# Patient Record
Sex: Female | Born: 1970 | Race: White | Hispanic: No | Marital: Single | Smoking: Former smoker
Health system: Southern US, Community
[De-identification: ages and names within clinical notes are randomized; demographics above are authoritative.]

## PROBLEM LIST (undated history)

## (undated) ENCOUNTER — Emergency Department (HOSPITAL_BASED_OUTPATIENT_CLINIC_OR_DEPARTMENT_OTHER)
Disposition: A | Discharge status: Home / Self Care | Payer: Medicaid Other | Attending: Emergency Medicine | Admitting: Emergency Medicine

## (undated) ENCOUNTER — Emergency Department (HOSPITAL_COMMUNITY): Admission: EM | Payer: Medicare Other | Source: Home / Self Care

## (undated) DIAGNOSIS — E7800 Pure hypercholesterolemia, unspecified: Secondary | ICD-10-CM

## (undated) DIAGNOSIS — I10 Essential (primary) hypertension: Secondary | ICD-10-CM

## (undated) DIAGNOSIS — R011 Cardiac murmur, unspecified: Secondary | ICD-10-CM

## (undated) DIAGNOSIS — N183 Chronic kidney disease, stage 3 unspecified: Secondary | ICD-10-CM

## (undated) DIAGNOSIS — J40 Bronchitis, not specified as acute or chronic: Secondary | ICD-10-CM

## (undated) DIAGNOSIS — M1990 Unspecified osteoarthritis, unspecified site: Secondary | ICD-10-CM

## (undated) DIAGNOSIS — H269 Unspecified cataract: Secondary | ICD-10-CM

## (undated) DIAGNOSIS — R7881 Bacteremia: Secondary | ICD-10-CM

## (undated) DIAGNOSIS — L089 Local infection of the skin and subcutaneous tissue, unspecified: Secondary | ICD-10-CM

## (undated) DIAGNOSIS — L97519 Non-pressure chronic ulcer of other part of right foot with unspecified severity: Secondary | ICD-10-CM

## (undated) DIAGNOSIS — Z9289 Personal history of other medical treatment: Secondary | ICD-10-CM

## (undated) DIAGNOSIS — I82409 Acute embolism and thrombosis of unspecified deep veins of unspecified lower extremity: Secondary | ICD-10-CM

## (undated) DIAGNOSIS — L03115 Cellulitis of right lower limb: Secondary | ICD-10-CM

## (undated) DIAGNOSIS — E109 Type 1 diabetes mellitus without complications: Secondary | ICD-10-CM

## (undated) DIAGNOSIS — A0472 Enterocolitis due to Clostridium difficile, not specified as recurrent: Secondary | ICD-10-CM

## (undated) DIAGNOSIS — G4730 Sleep apnea, unspecified: Secondary | ICD-10-CM

## (undated) DIAGNOSIS — G629 Polyneuropathy, unspecified: Secondary | ICD-10-CM

## (undated) DIAGNOSIS — G709 Myoneural disorder, unspecified: Secondary | ICD-10-CM

## (undated) DIAGNOSIS — Z889 Allergy status to unspecified drugs, medicaments and biological substances status: Secondary | ICD-10-CM

## (undated) DIAGNOSIS — E1140 Type 2 diabetes mellitus with diabetic neuropathy, unspecified: Secondary | ICD-10-CM

## (undated) DIAGNOSIS — I739 Peripheral vascular disease, unspecified: Secondary | ICD-10-CM

## (undated) DIAGNOSIS — D649 Anemia, unspecified: Secondary | ICD-10-CM

## (undated) DIAGNOSIS — E11621 Type 2 diabetes mellitus with foot ulcer: Secondary | ICD-10-CM

## (undated) HISTORY — PX: WISDOM TOOTH EXTRACTION: SHX21

## (undated) HISTORY — PX: TRANSMETATARSAL AMPUTATION: SHX6197

## (undated) HISTORY — DX: Allergy status to unspecified drugs, medicaments and biological substances: Z88.9

## (undated) HISTORY — PX: OTHER SURGICAL HISTORY: SHX169

## (undated) HISTORY — DX: Anemia, unspecified: D64.9

## (undated) HISTORY — PX: PERIPHERALLY INSERTED CENTRAL CATHETER INSERTION: SHX2221

## (undated) HISTORY — PX: EYE SURGERY: SHX253

---

## 2004-05-01 ENCOUNTER — Ambulatory Visit (HOSPITAL_COMMUNITY): Admission: AD | Admit: 2004-05-01 | Discharge: 2004-05-02 | Payer: Self-pay | Admitting: Obstetrics & Gynecology

## 2004-05-02 ENCOUNTER — Encounter (INDEPENDENT_AMBULATORY_CARE_PROVIDER_SITE_OTHER): Payer: Self-pay | Admitting: Specialist

## 2005-01-14 ENCOUNTER — Emergency Department (HOSPITAL_COMMUNITY): Admission: EM | Admit: 2005-01-14 | Discharge: 2005-01-14 | Payer: Self-pay | Admitting: Emergency Medicine

## 2005-05-25 ENCOUNTER — Inpatient Hospital Stay (HOSPITAL_COMMUNITY): Admission: EM | Admit: 2005-05-25 | Discharge: 2005-05-30 | Payer: Self-pay | Admitting: Emergency Medicine

## 2005-06-05 ENCOUNTER — Ambulatory Visit: Payer: Self-pay | Admitting: Nurse Practitioner

## 2005-09-26 IMAGING — US US OB COMP LESS 14 WK
1 series · 14 of 28 positions shown · non-contrast
Comparison: none

CLINICAL DATA: 33-year-old female, with a positive pregnancy test and an estimated gestation of eight weeks by LMP.  Pelvic pain and bleeding. 
 OB ULTRASOUND LESS THAN 14 WEEKS WITH TRANSVAGINAL MODIFY:

[Series 1: us ob comp less 14 wk · 0.29mm/px · 40 acquisitions, 14 frames shown]
[im 2/40]
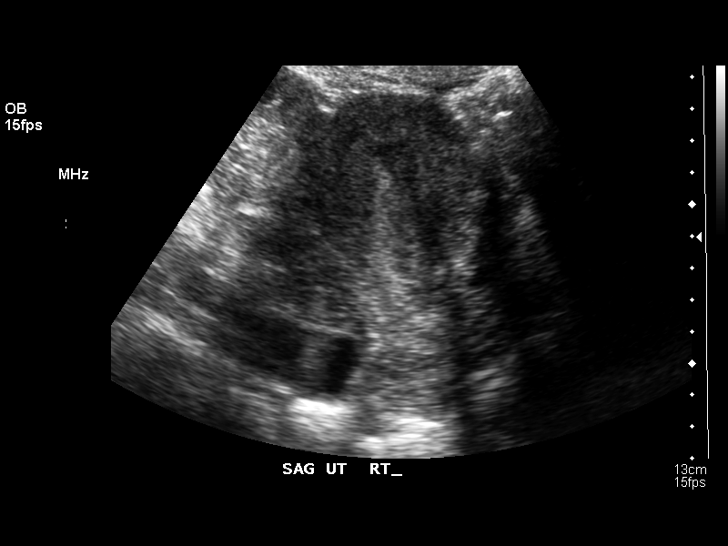
[im 5/40]
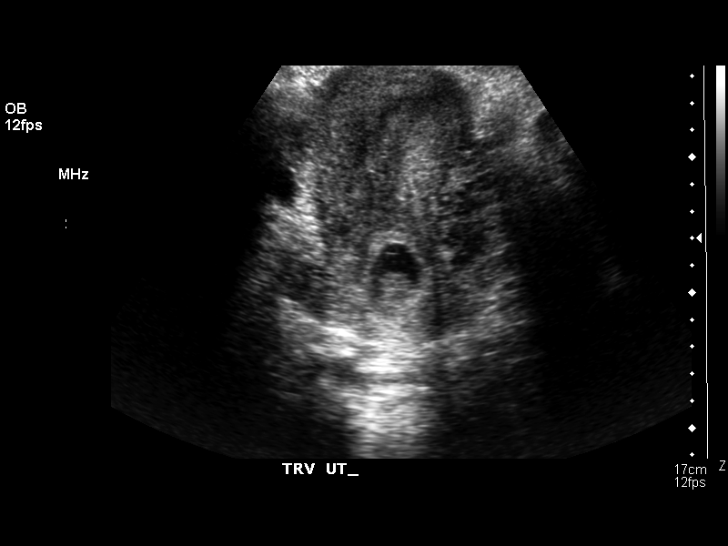
[im 8/40]
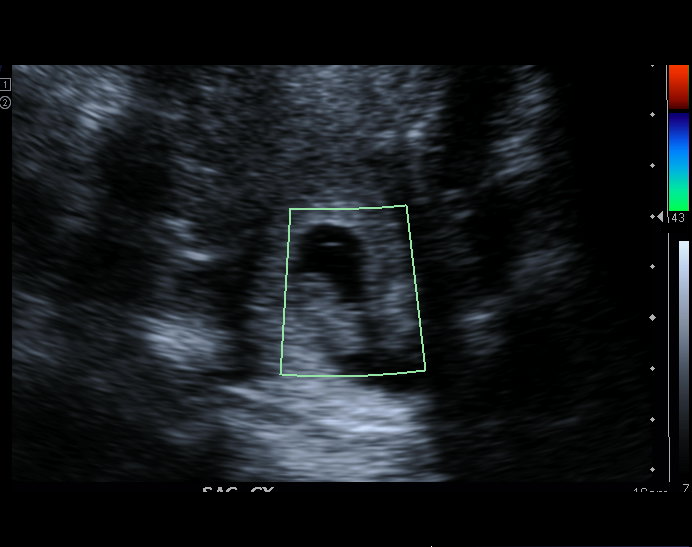
[im 11/40]
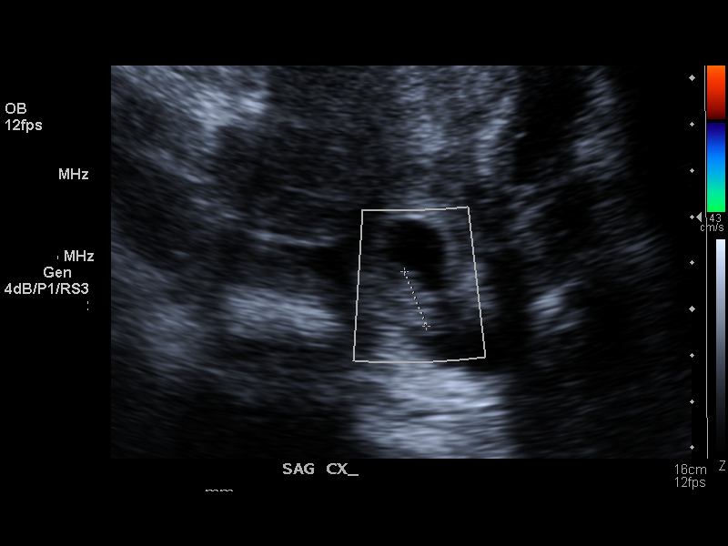
[im 14/40]
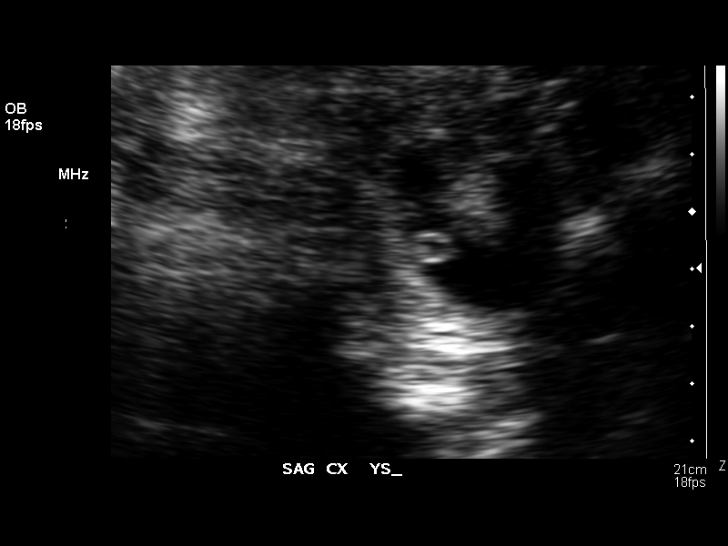
[im 16/40]
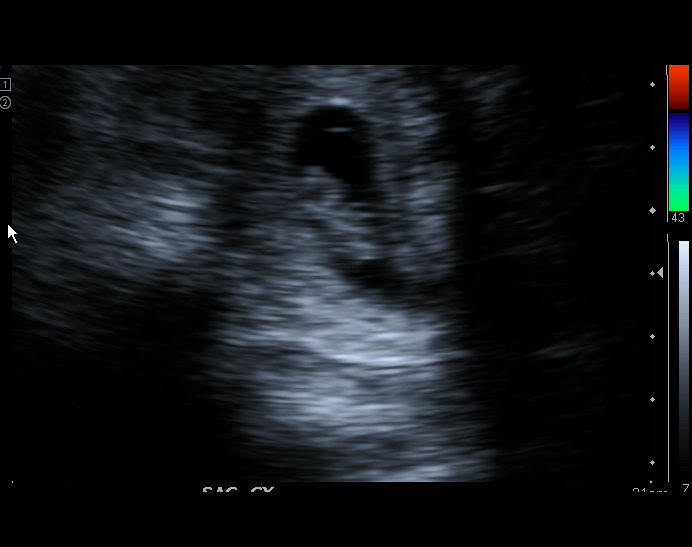
[im 19/40]
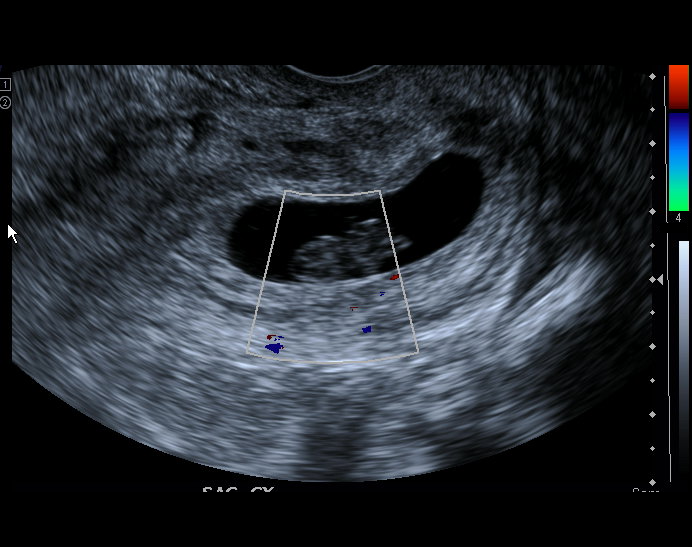
[im 22/40]
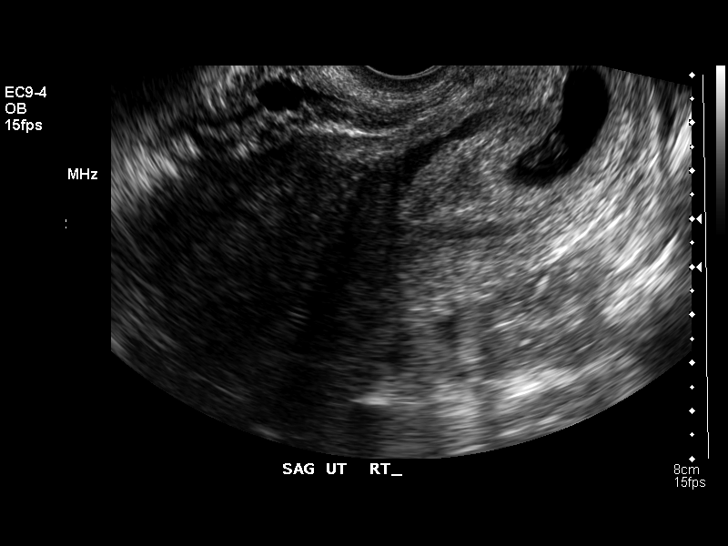
[im 25/40]
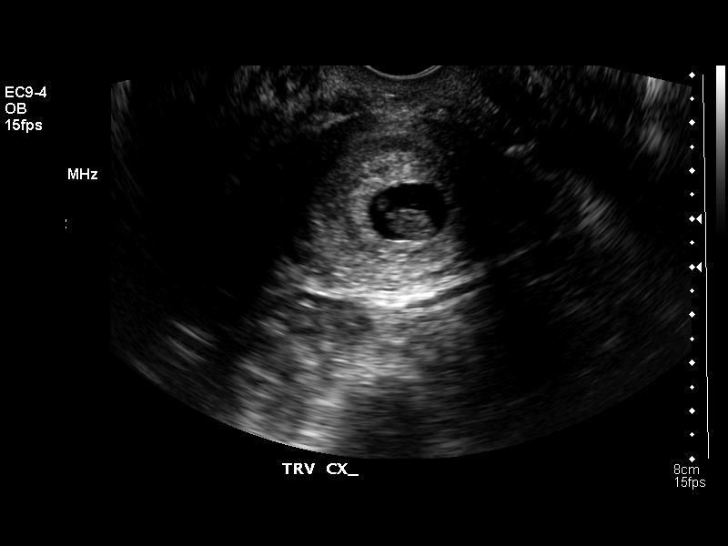
[im 28/40]
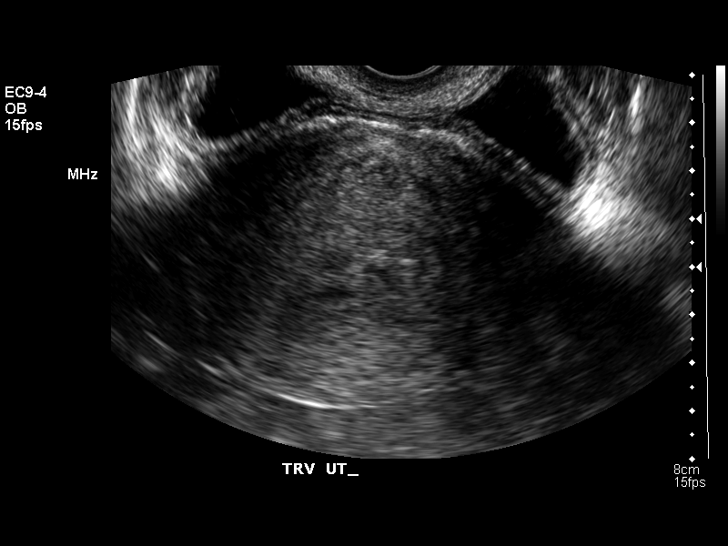
[im 31/40]
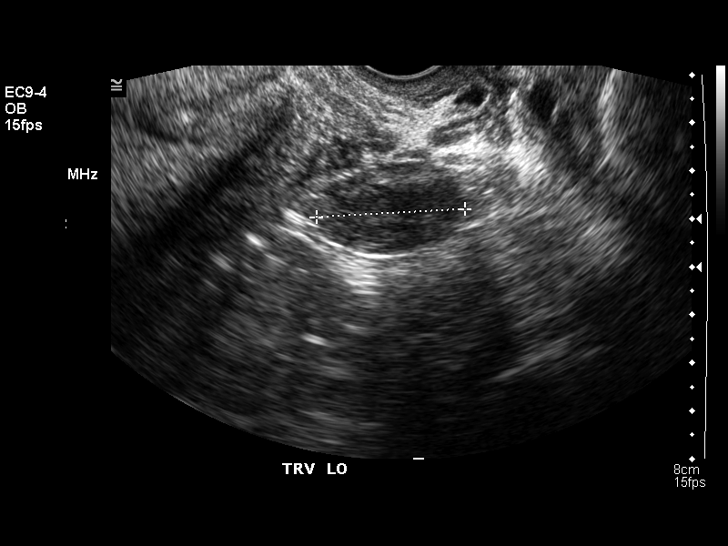
[im 34/40]
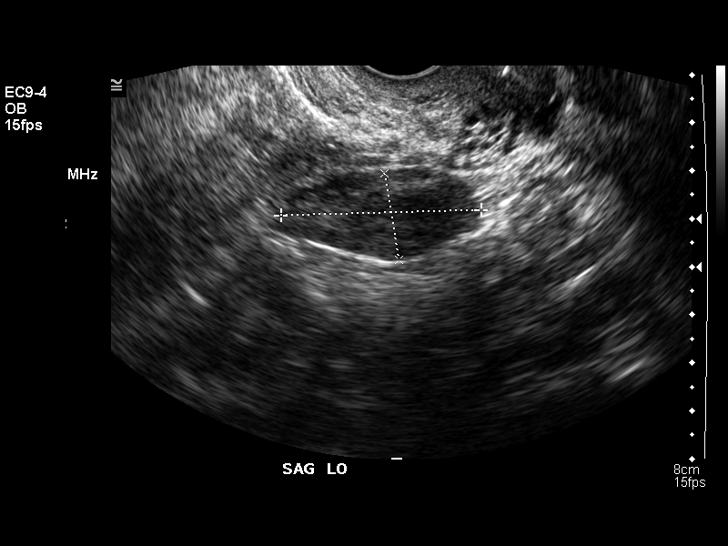
[im 37/40]
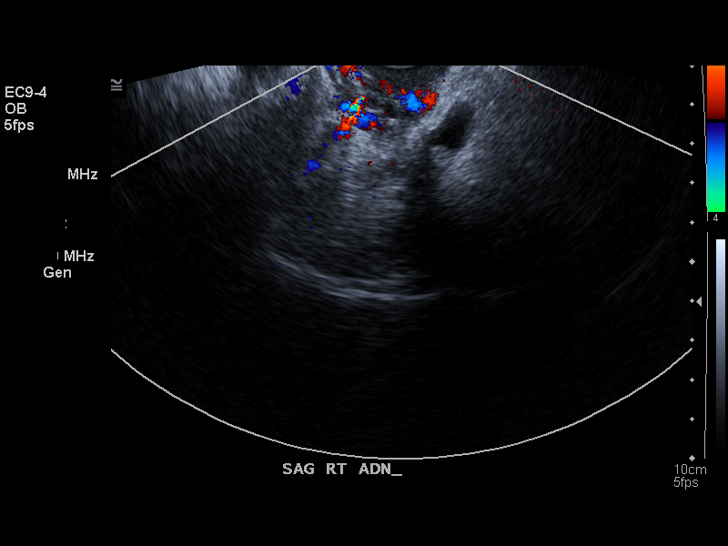
[im 40/40]
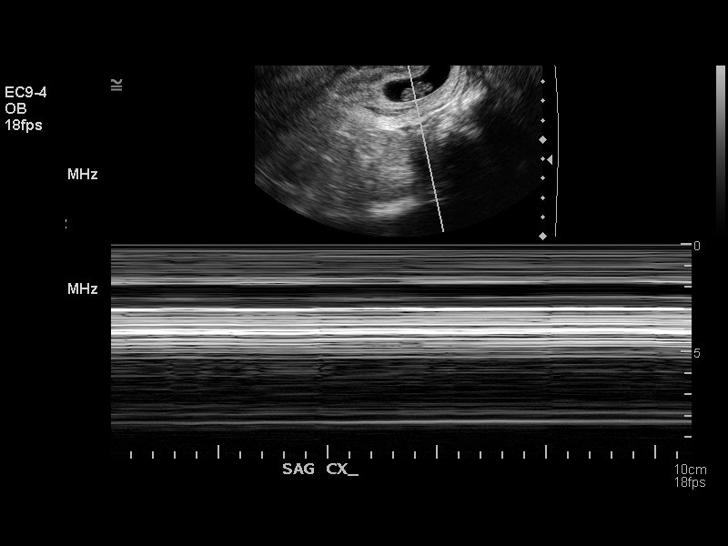

[14 of 28 positions shown; findings below may reference images not displayed]

FINDINGS: There is a gestational sac with yolk sac and embryo within the cervical canal.  No fetal heart tones are noted.  Crown-rump length of this embryo measures 13.7 mm corresponding to a gestational age of 7 weeks, 5 days.  There is a heterogeneous thickened endometrium and subchorionic hemorrhage noted.  Right ovary is not visualized.   Left ovary is unremarkable except for probable resolving corpus luteum measuring 1.5 cm.  No evidence of free fluid or adnexal masses.
IMPRESSION: 1.  Gestation within the cervical canal compatible with spontaneous abortion in progress.  No evidence of cardiac activity in the embryo. 
 2.  Right ovary not visualized. 
 3.  No evidence of free fluid or adnexal masses.

## 2006-05-03 ENCOUNTER — Ambulatory Visit: Payer: Self-pay | Admitting: Internal Medicine

## 2006-05-06 ENCOUNTER — Ambulatory Visit: Payer: Self-pay | Admitting: *Deleted

## 2006-06-11 IMAGING — CR DG RIBS W/ CHEST 3+V*L*
3 series · 3 of 3 positions shown · non-contrast
Comparison: none

CLINICAL DATA: Trauma, rib pain.
 FRONTAL CHEST WITH UNILATERAL LEFT RIBS ? 2 VIEW:
 The heart size and mediastinal contours are within normal limits.  Both lungs are clear.  
 No displaced rib fracture is seen.

[w chest pa]
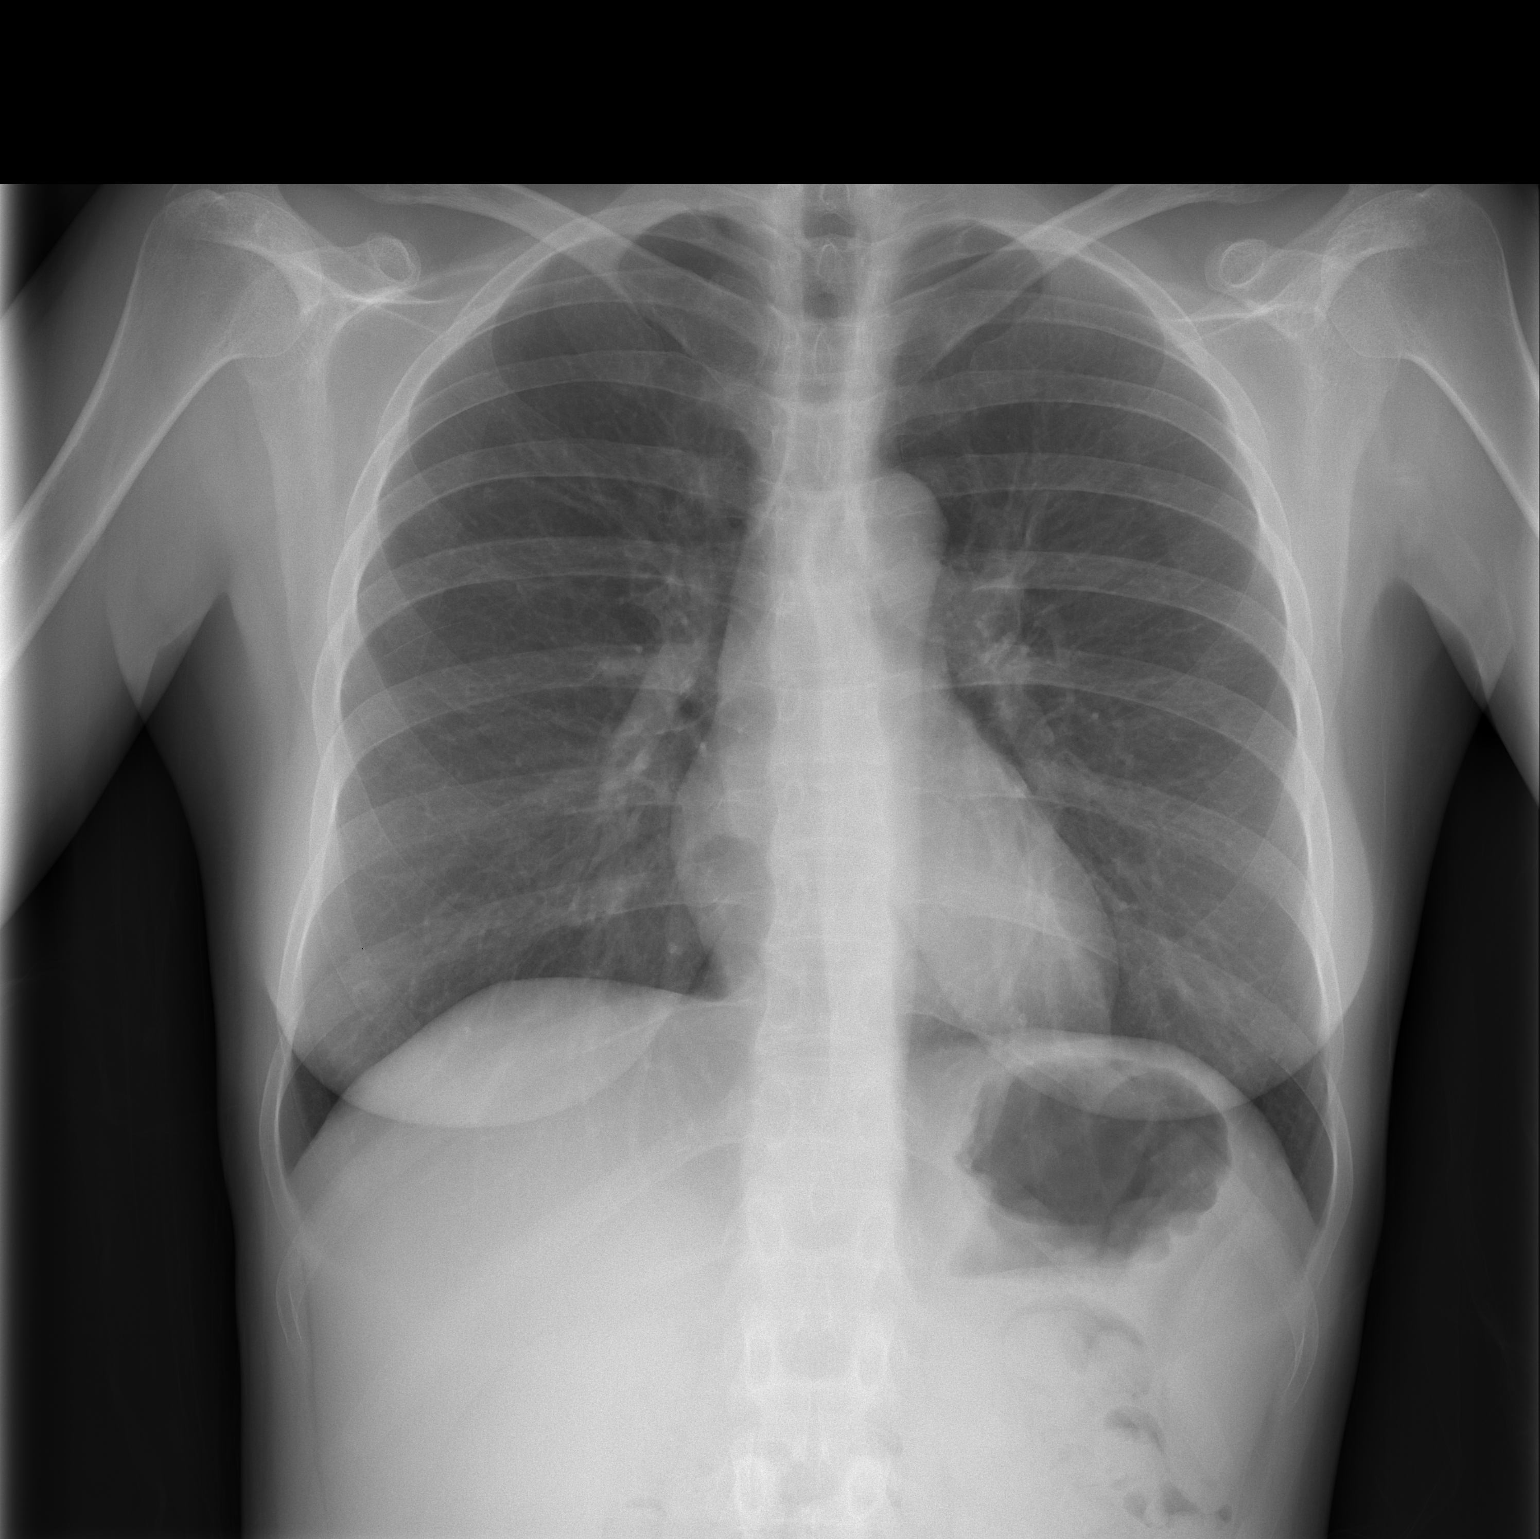

[w ribs ap/pa upper left]
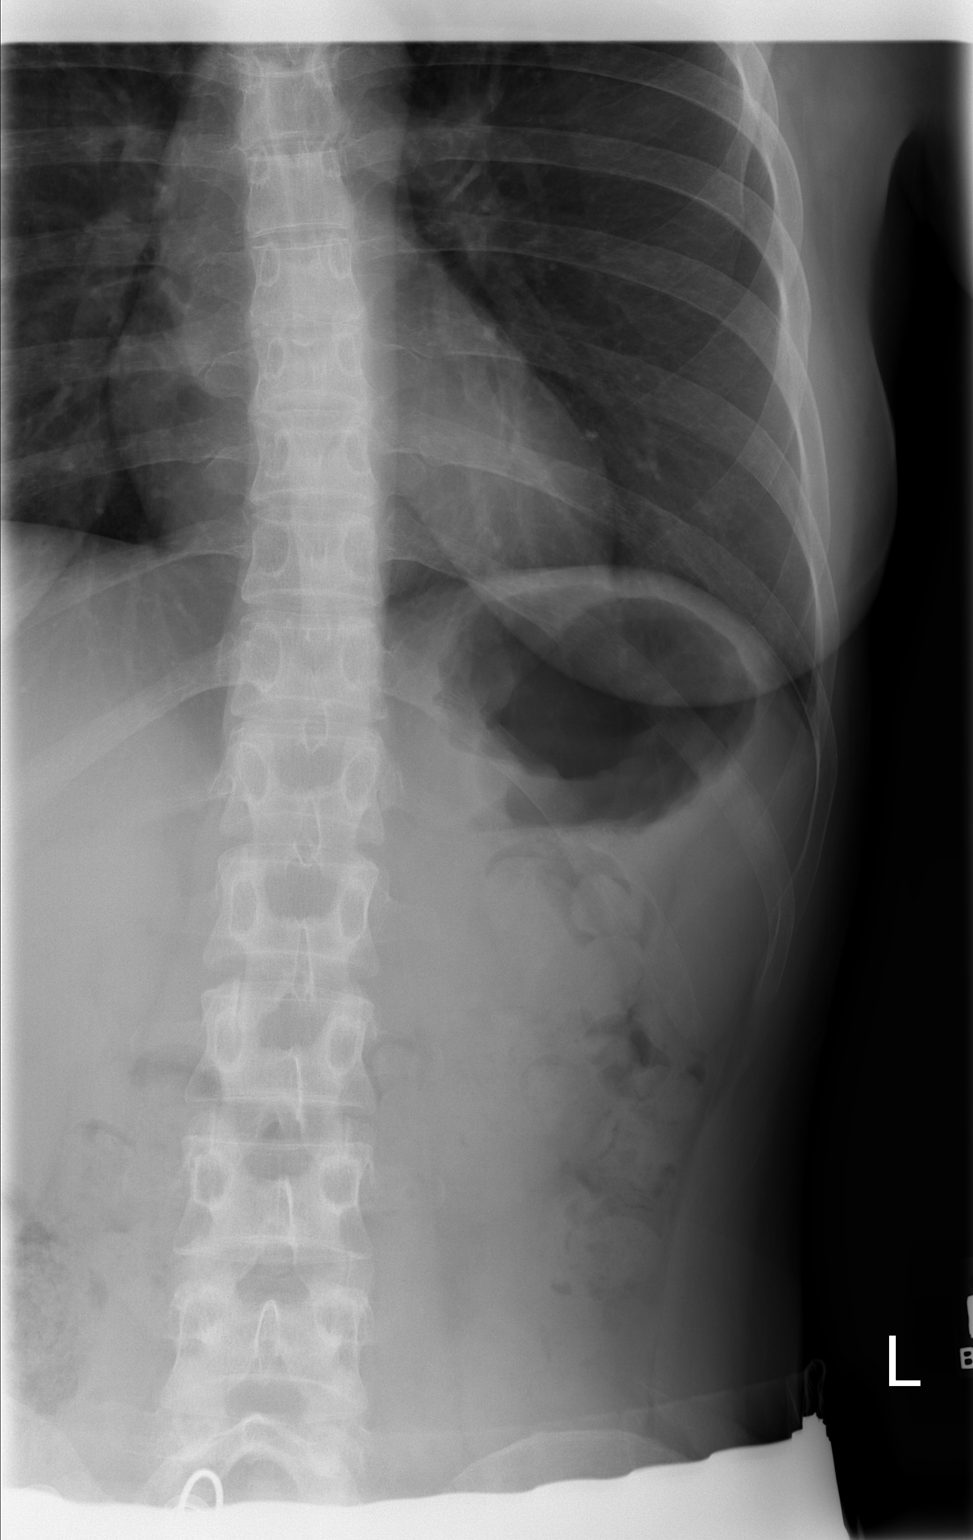

[w ribs oblique left *]
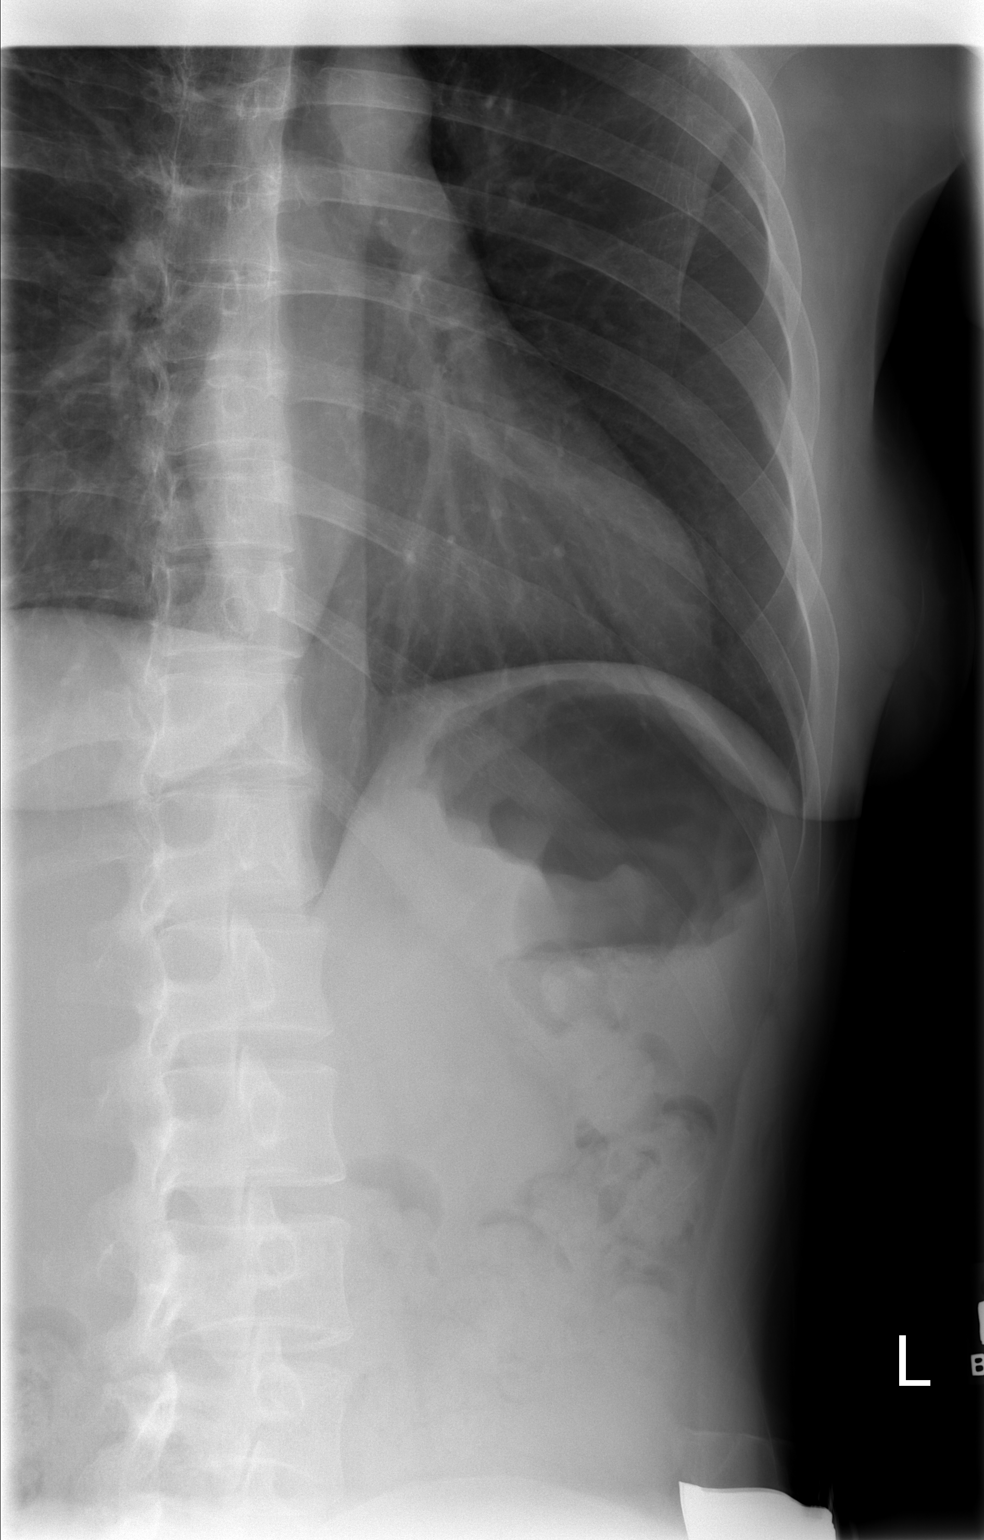

[3 of 3 positions shown; findings below may reference images not displayed]

IMPRESSION: 1.  No active disease.
 2.  No displaced rib fracture is seen.

## 2006-07-05 ENCOUNTER — Encounter (INDEPENDENT_AMBULATORY_CARE_PROVIDER_SITE_OTHER): Payer: Self-pay | Admitting: Specialist

## 2006-07-05 ENCOUNTER — Ambulatory Visit: Payer: Self-pay | Admitting: Internal Medicine

## 2006-08-30 ENCOUNTER — Ambulatory Visit: Payer: Self-pay | Admitting: Family Medicine

## 2006-10-07 ENCOUNTER — Ambulatory Visit: Payer: Self-pay | Admitting: Internal Medicine

## 2006-10-20 IMAGING — CT CT ABDOMEN W/ CM
2 of 5 series · 16 of 46 positions shown, 18 images · IV contrast (omnipaque)
Comparison: none

CLINICAL DATA: 34-year-old with abdominal pain.  Nausea, vomiting. 
ABDOMEN CT WITH CONTRAST:
TECHNIQUE: Multidetector CT imaging of the abdomen was performed following the standard protocol during bolus administration of intravenous contrast.
Contrast:  125 cc Omnipaque 300.  Oral contrast was given.
TECHNIQUE: Multidetector CT imaging of the pelvis was performed following the standard protocol during bolus administration of intravenous contrast.

[Series 2: abd_pel 5.0 b40f st · axial · 0.59mm/px · z∈[+849,+1304]mm · 13 of 103 slices shown, 15 images]
[im 6/103  soft-tissue]
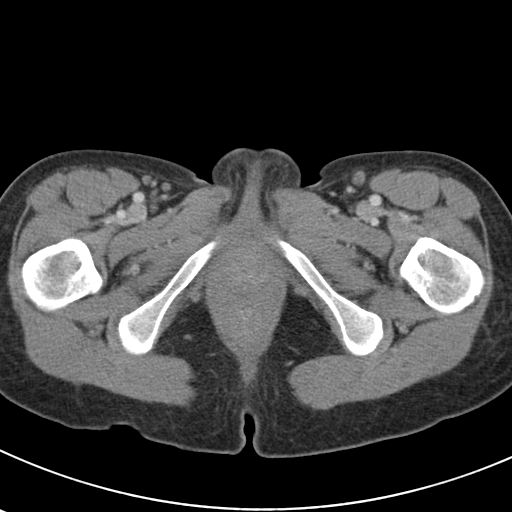
[im 6/103  bone]
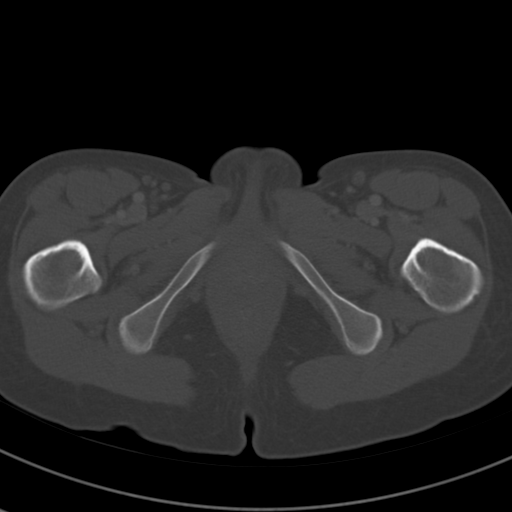
[im 16/103  soft-tissue]
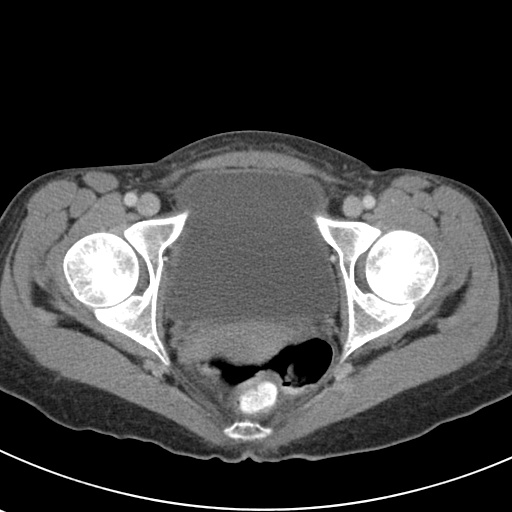
[im 21/103  soft-tissue]
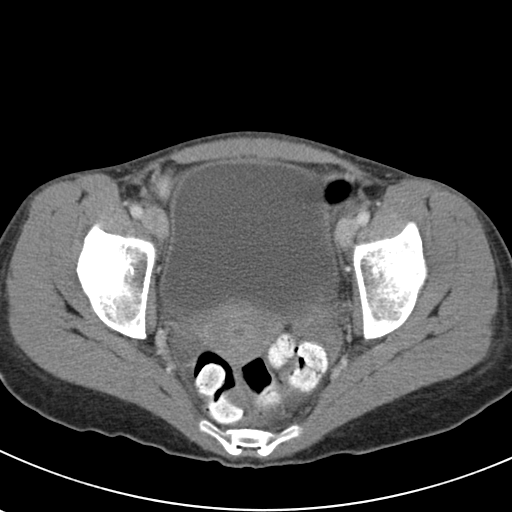
[im 31/103  soft-tissue]
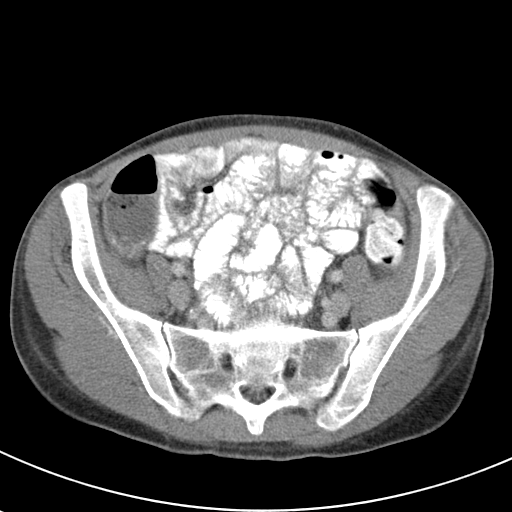
[im 36/103  soft-tissue]
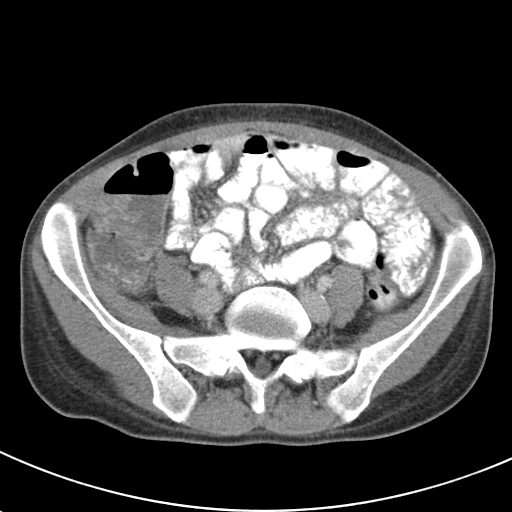
[im 46/103  soft-tissue]
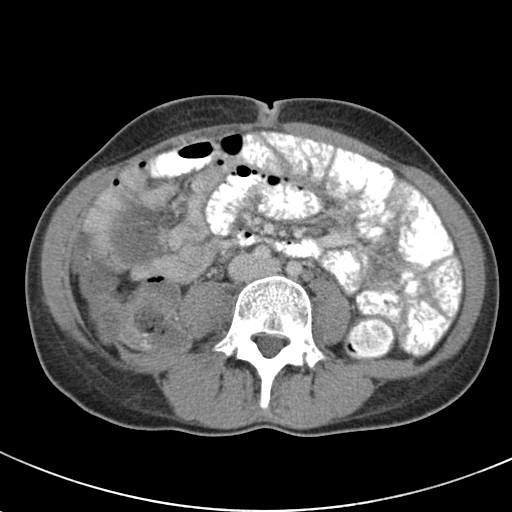
[im 52/103  soft-tissue]
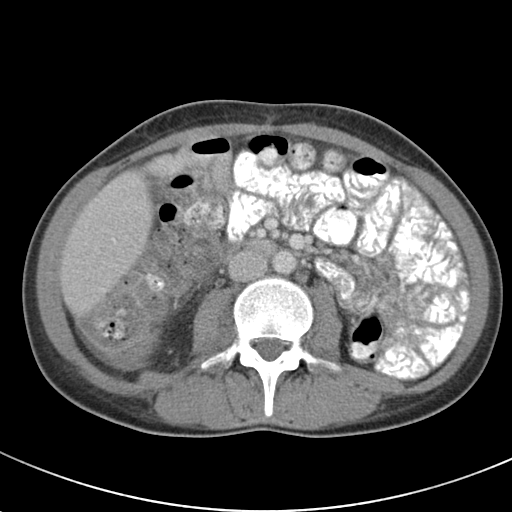
[im 57/103  soft-tissue]
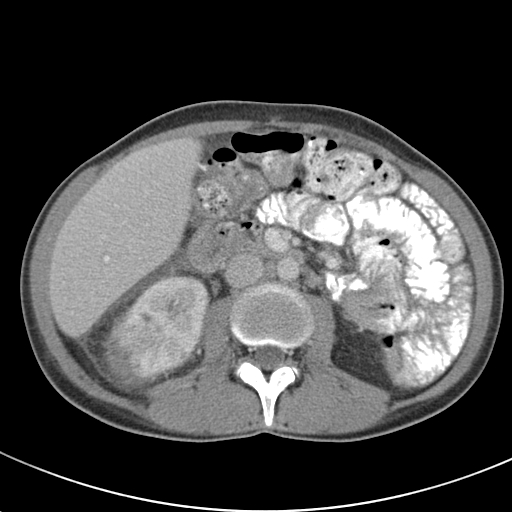
[im 67/103  soft-tissue]
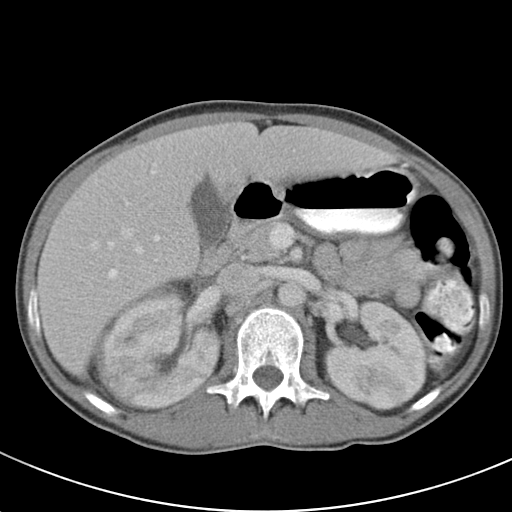
[im 67/103  bone]
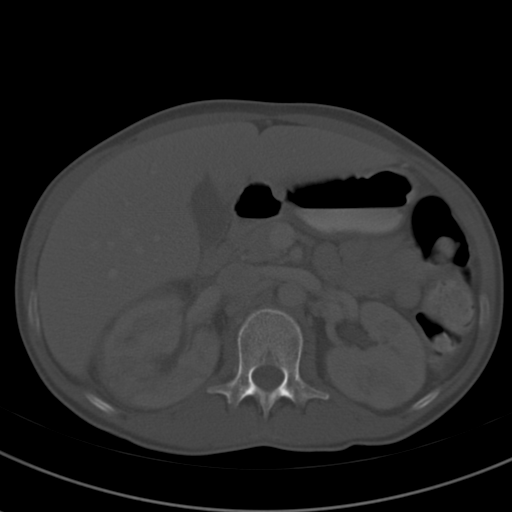
[im 72/103  soft-tissue]
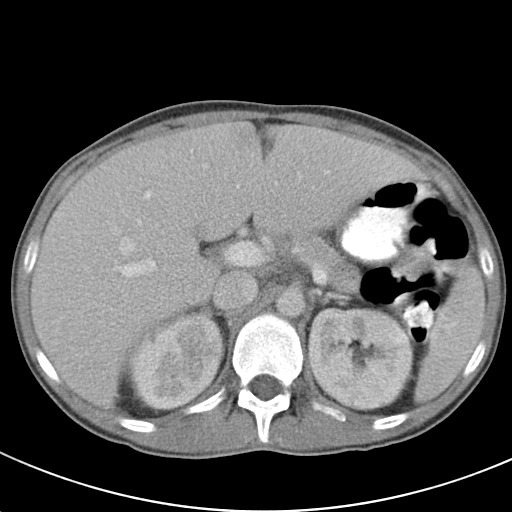
[im 82/103  soft-tissue]
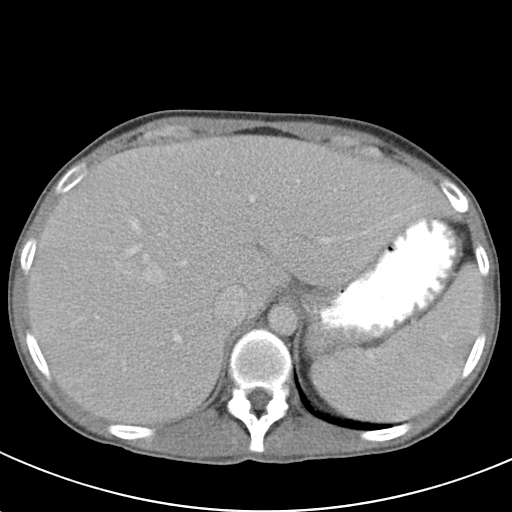
[im 87/103  soft-tissue]
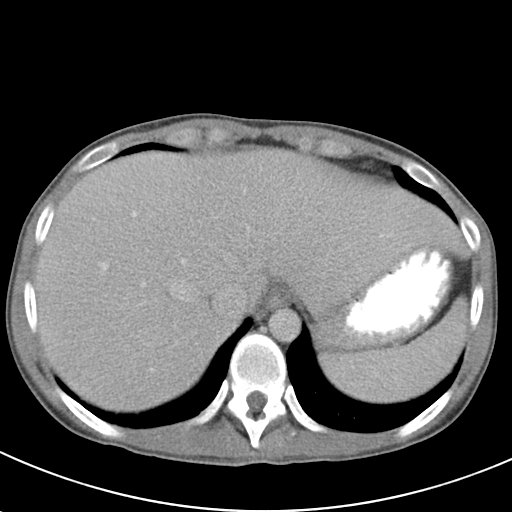
[im 97/103  soft-tissue]
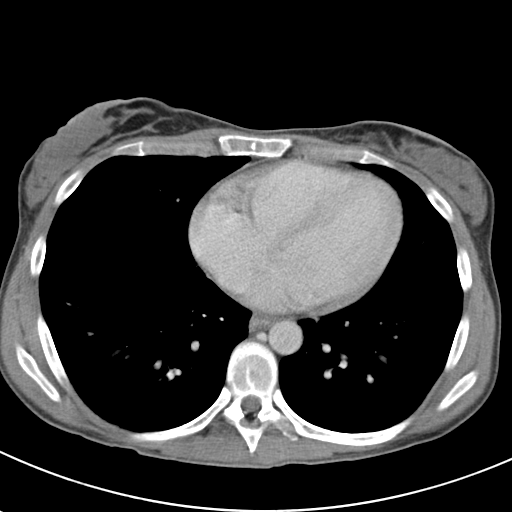

[Series 602: cor · coronal · 1.03mm/px · 3 of 50 slices shown]
[im 17/50  soft-tissue]
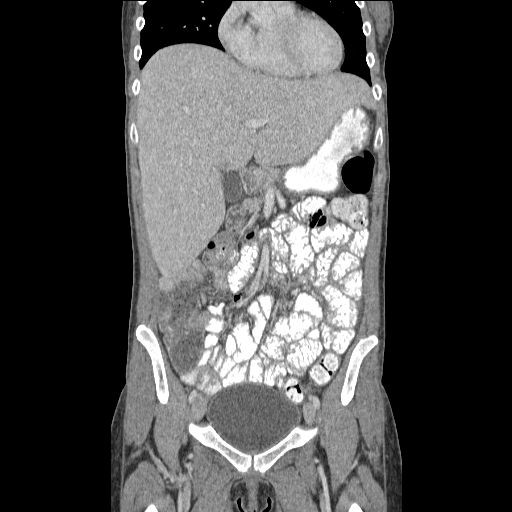
[im 22/50  soft-tissue]
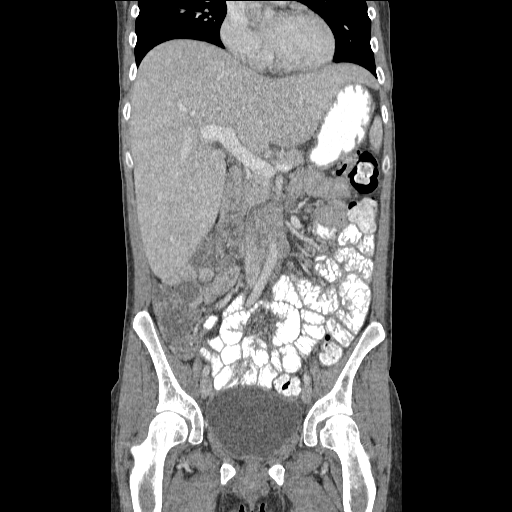
[im 28/50  soft-tissue]
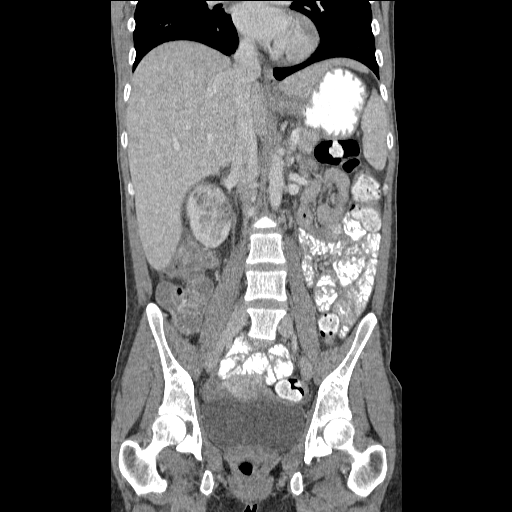

[16 of 46 positions shown; findings below may reference images not displayed]

FINDINGS: Images of the lung bases are unremarkable.
The right kidney is markedly abnormal with heterogeneous patchy enhancement on both early and delayed views.  There is excretion of contrast on the delayed imaging.  Contrast is seen within the right and left renal veins.  Findings are most likely related to infectious process.  There are small retroperitoneal lymph nodes which are likely reactive.  These measure 8 mm and smaller.  Differential diagnosis also includes diffuse involvement by neoplasm which can be seen with lymphoma, but is thought to be less likely given the lack of significantly enlarged retroperitoneal lymph nodes.  
There is perinephric stranding on the right.  The liver is enlarged, measuring 24 cm in craniocaudal length.
No focal abnormality is seen within the liver, spleen, pancreas, or adrenal glands.  The gallbladder is present.  There does appear to be some minimal enhancement of the right ureteral wall, supporting infection as the most likely cause of this abnormality.
IMPRESSION: Marked abnormality of the right kidney.  Infection is favored.  See above. 
PELVIS CT WITH CONTRAST:
FINDINGS: The uterus is present.  There is free pelvic fluid.  No adnexal mass or pelvic adenopathy identified.  Pelvic bowel loops have a normal appearance.  The appendix is not well-visualized, but there is no inflammatory change within the right lower quadrant.
IMPRESSION: Free pelvic fluid which is slightly greater than expected and may be related to inflammation of the right kidney as described above.

## 2006-10-22 IMAGING — CR DG CHEST 2V
2 series · 2 of 2 positions shown · non-contrast
Comparison: 01/14/2005.

CLINICAL DATA: Cough, shortness of breath and hypoxia.

CHEST - 2 VIEW

[w chest pa]
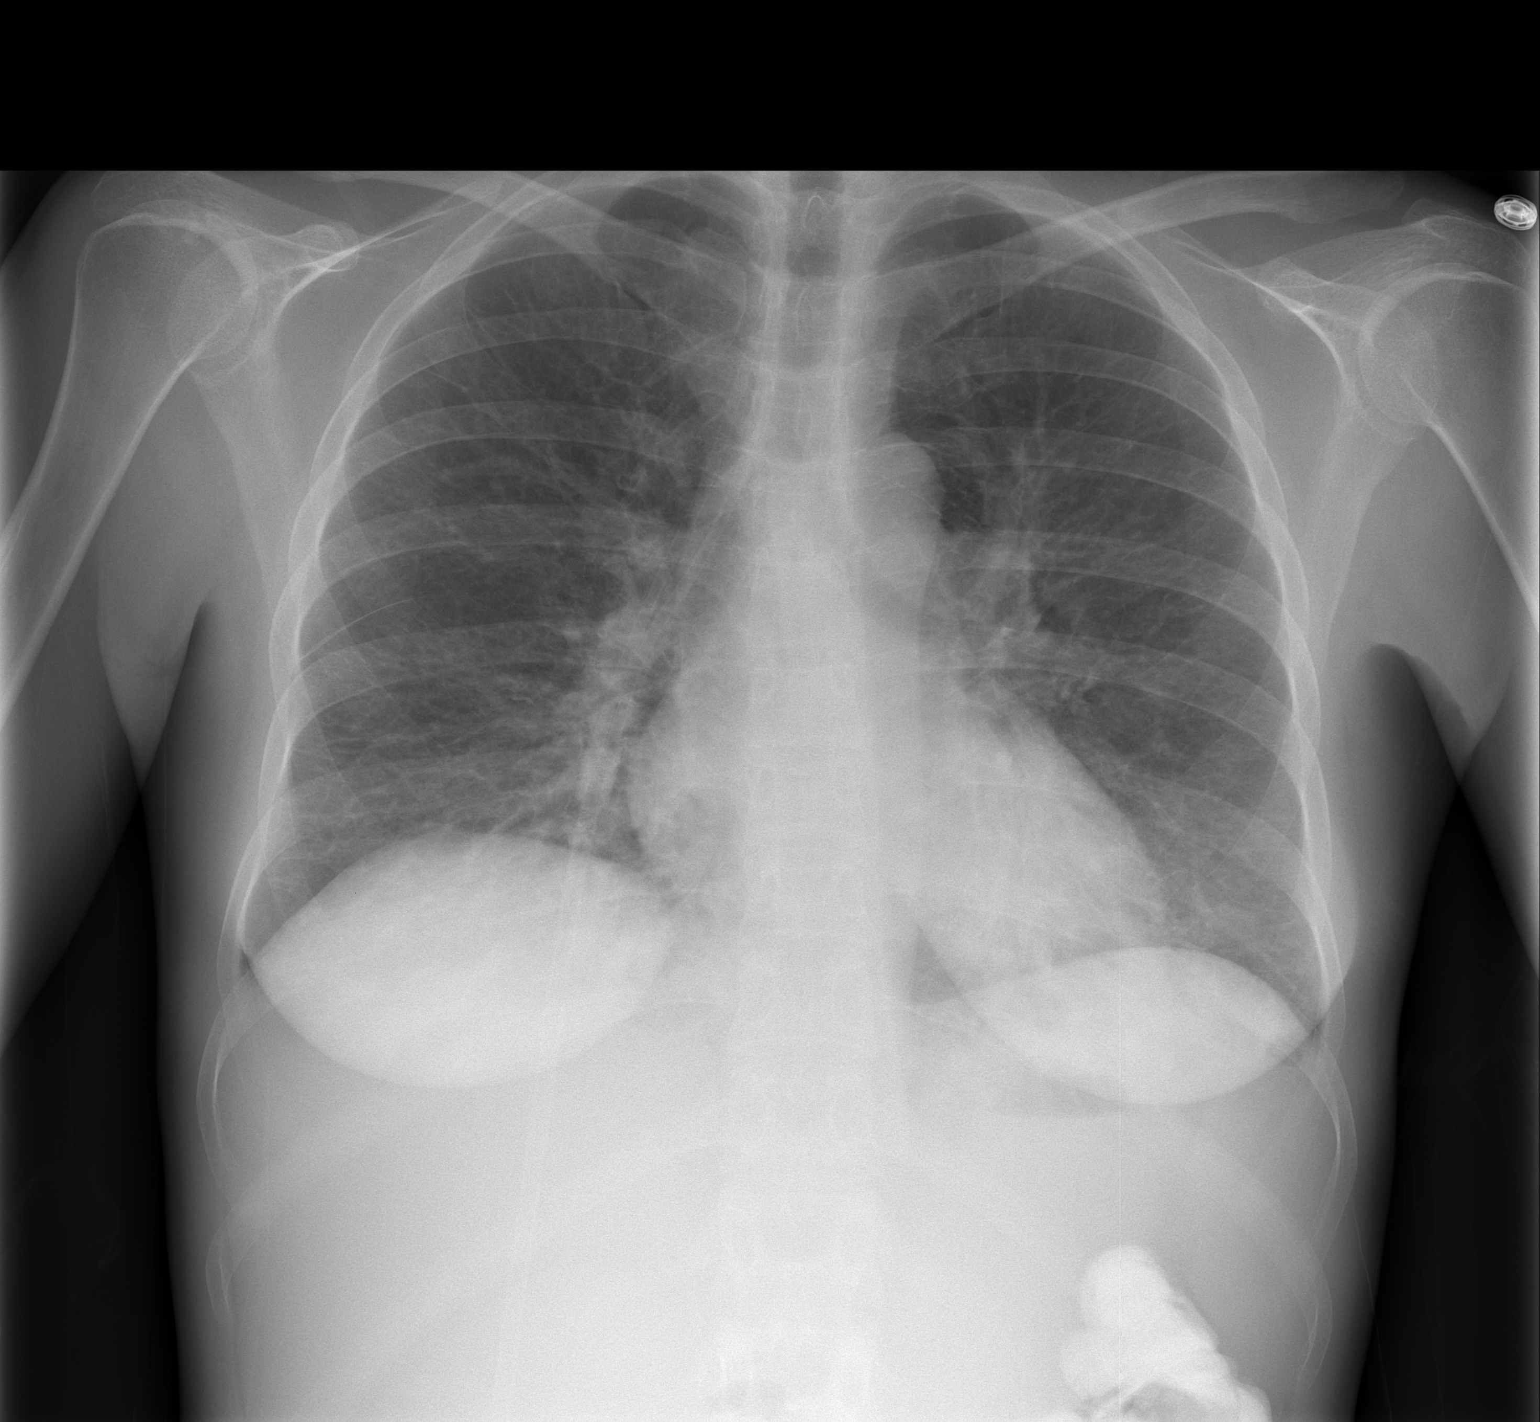

[w chest lat]
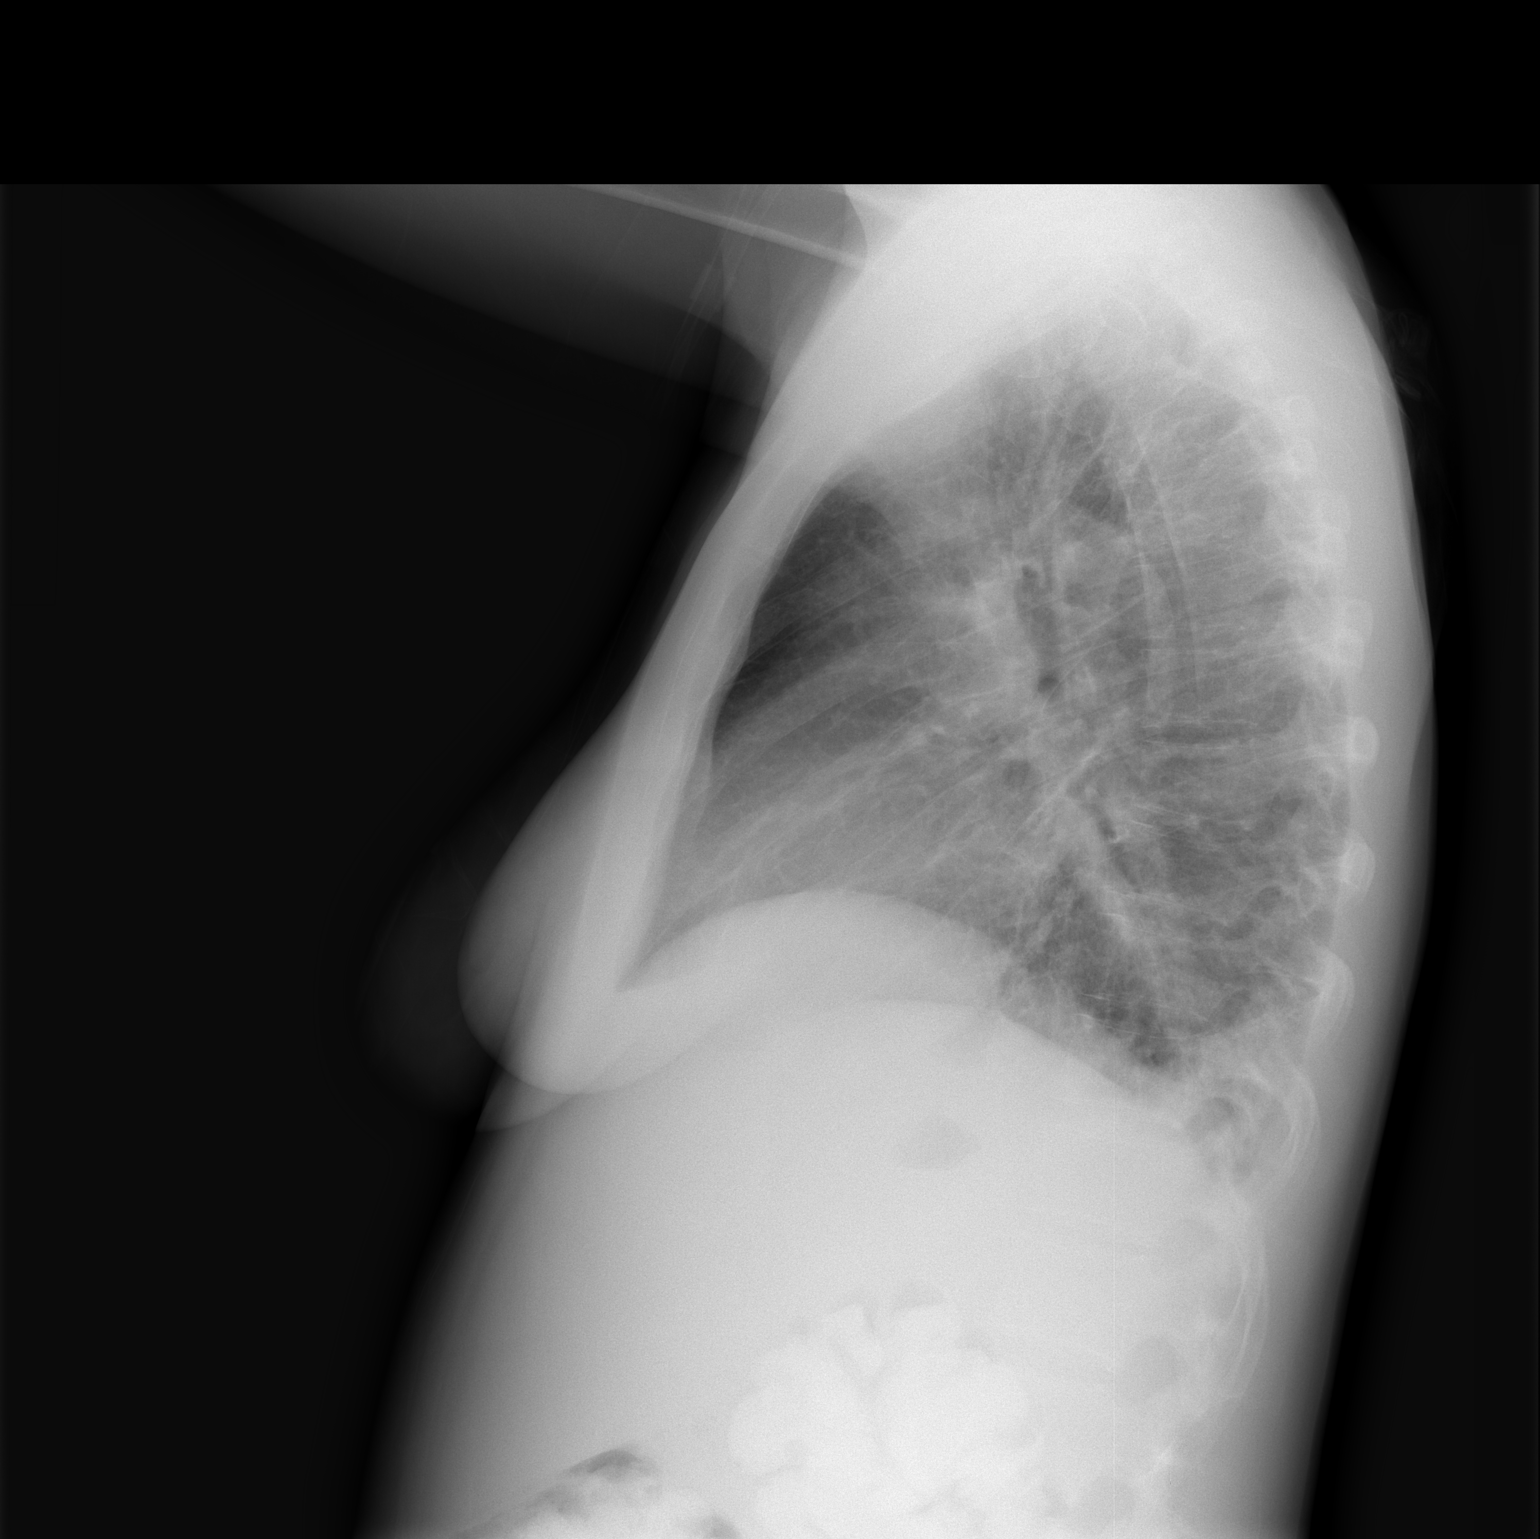

[2 of 2 positions shown; findings below may reference images not displayed]

FINDINGS: Decreased inspiration with no gross change in a normal sized heart.
Interval mild left basilar opacity. Interval mild diffuse peribronchial
thickening. Minimally increased prominence of the interstitial markings with
Kerley lines at both lung lung bases. No pleural fluid is seen. Minimal
scoliosis.

IMPRESSION

1. Mild left lower lobe atelectasis and possible pneumonia.

2. Interval mild bronchitic changes.

3. Minimally increased prominence of the interstitial markings, possibly
reflecting a viral pneumonitis. Interstitial edema is unlikely in the absence of
cardiomegaly, pulmonary vascular congestion or pleural fluid.

## 2006-10-24 IMAGING — CR DG ABDOMEN 1V
2 series · 2 of 2 positions shown · non-contrast
Comparison: none

CLINICAL DATA: Nausea, bloating, abdominal pain.
 ABDOMEN - H1[name] ? 05/29/05:
 There is a large amount of fecal matter within the colon. Small bowel gas pattern is normal.  No abnormal calcifications or bony findings.

[view not recorded (1 of 2)]
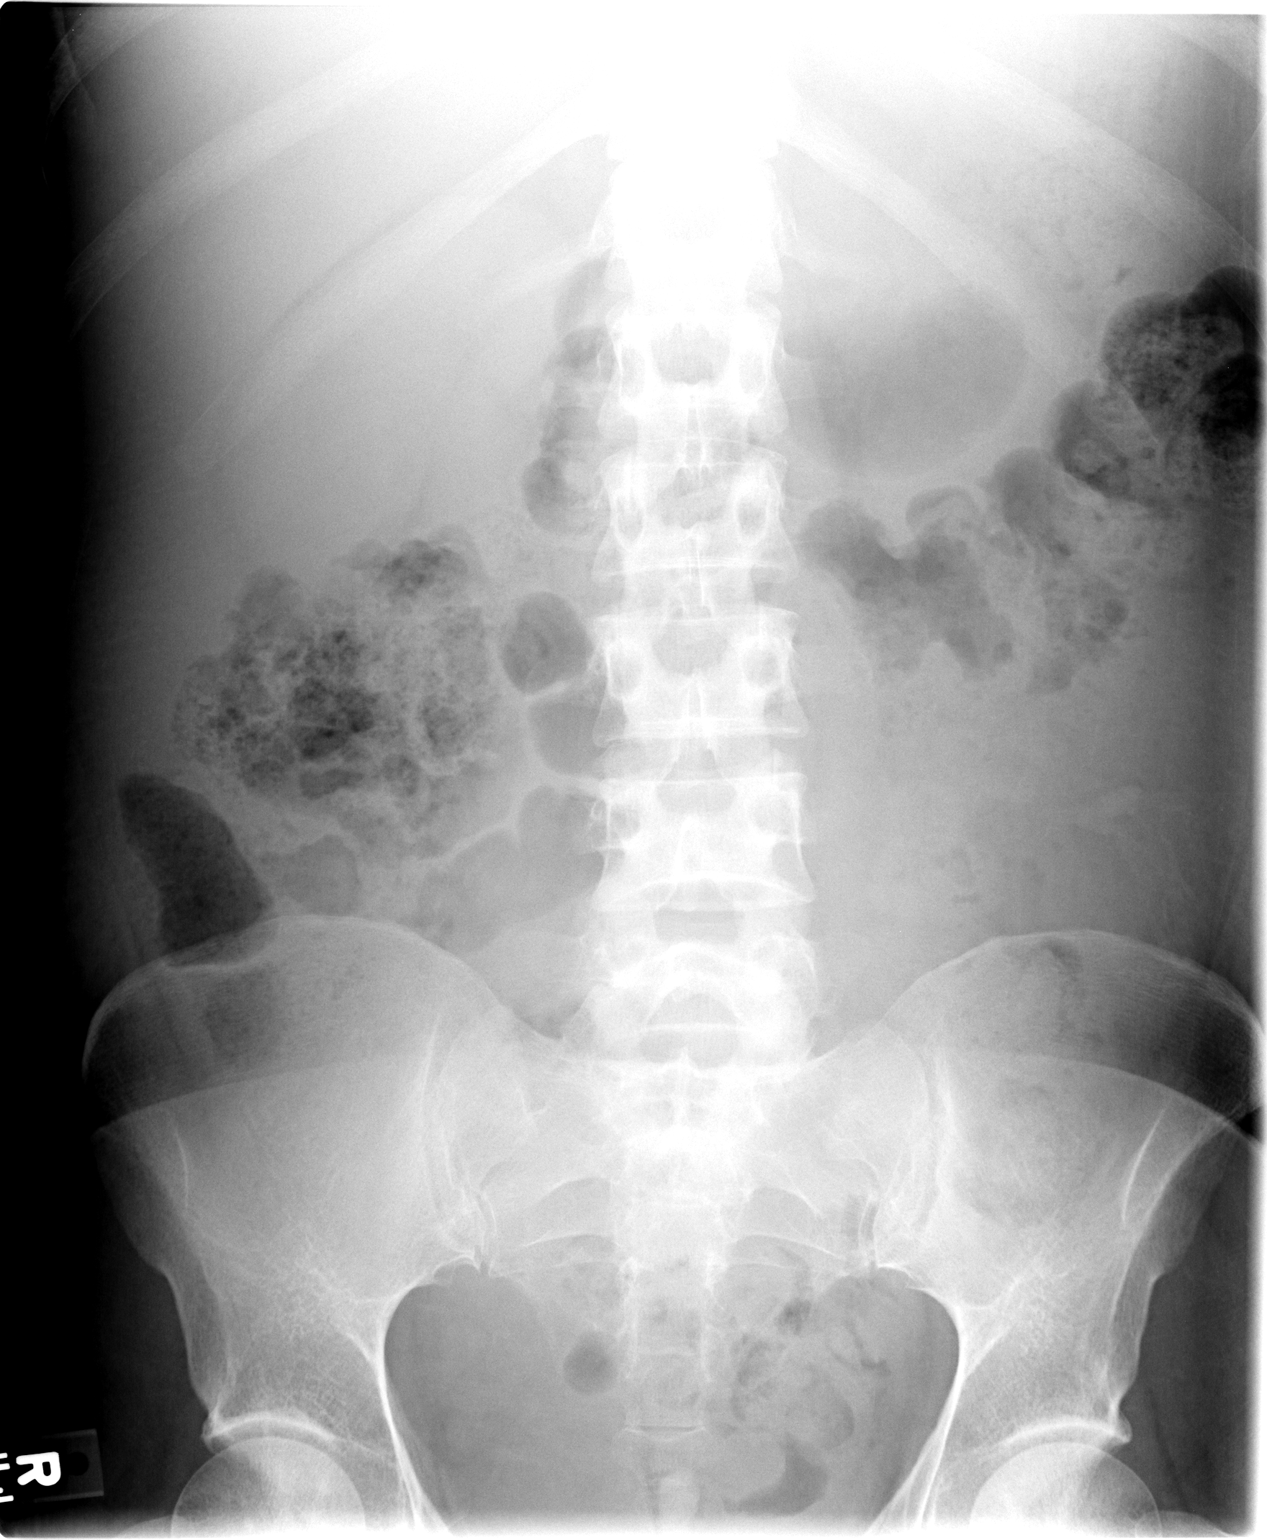

[view not recorded (2 of 2)]
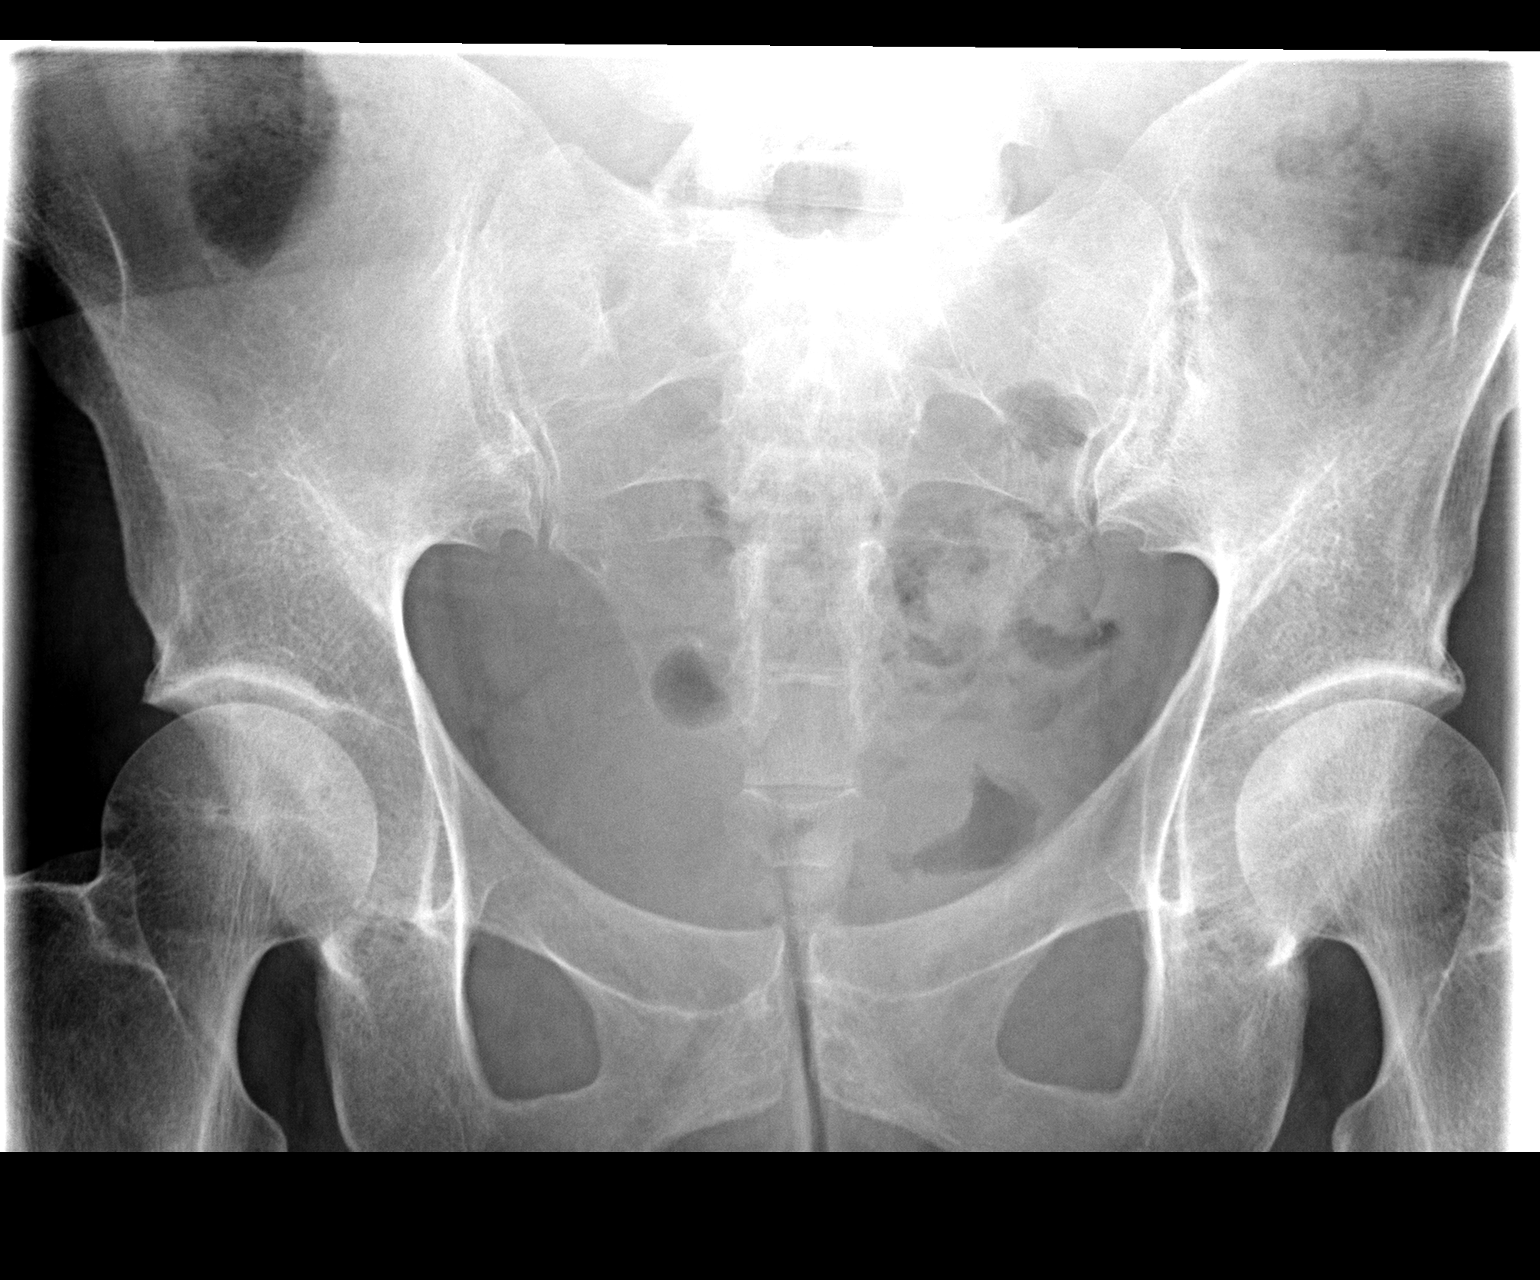

[2 of 2 positions shown; findings below may reference images not displayed]

IMPRESSION: Increased stool.  Otherwise negative.

## 2006-11-06 ENCOUNTER — Ambulatory Visit: Payer: Self-pay | Admitting: Internal Medicine

## 2006-11-06 ENCOUNTER — Encounter (INDEPENDENT_AMBULATORY_CARE_PROVIDER_SITE_OTHER): Payer: Self-pay | Admitting: Nurse Practitioner

## 2006-11-06 LAB — CONVERTED CEMR LAB
ALT: 31 units/L (ref 0–35)
AST: 19 units/L (ref 0–37)
Albumin: 3.7 g/dL (ref 3.5–5.2)
BUN: 24 mg/dL — ABNORMAL HIGH (ref 6–23)
Basophils Relative: 0 % (ref 0–1)
CO2: 24 meq/L (ref 19–32)
Calcium: 8.8 mg/dL (ref 8.4–10.5)
Chloride: 97 meq/L (ref 96–112)
Creatinine, Ser: 0.77 mg/dL (ref 0.40–1.20)
Eosinophils Relative: 1 % (ref 0–5)
Glucose, Bld: 522 mg/dL (ref 70–99)
HCT: 36 % (ref 36.0–46.0)
Hemoglobin: 10.8 g/dL — ABNORMAL LOW (ref 12.0–15.0)
Lymphocytes Relative: 21 % (ref 12–46)
Lymphs Abs: 2.2 10*3/uL (ref 0.7–3.3)
MCV: 71.3 fL — ABNORMAL LOW (ref 78.0–100.0)
Monocytes Relative: 7 % (ref 3–11)
Neutro Abs: 7.3 10*3/uL (ref 1.7–7.7)
Neutrophils Relative %: 70 % (ref 43–77)
Platelets: 454 10*3/uL — ABNORMAL HIGH (ref 150–400)
Potassium: 4.9 meq/L (ref 3.5–5.3)
RBC: 5.05 M/uL (ref 3.87–5.11)
RDW: 18.6 % — ABNORMAL HIGH (ref 11.5–14.0)
Sodium: 130 meq/L — ABNORMAL LOW (ref 135–145)
Total Bilirubin: 0.3 mg/dL (ref 0.3–1.2)
Total Protein: 6.9 g/dL (ref 6.0–8.3)
WBC: 10.4 10*3/uL (ref 4.0–10.5)

## 2006-11-22 ENCOUNTER — Ambulatory Visit: Payer: Self-pay | Admitting: Internal Medicine

## 2006-12-12 ENCOUNTER — Ambulatory Visit: Payer: Self-pay | Admitting: Internal Medicine

## 2006-12-13 ENCOUNTER — Emergency Department (HOSPITAL_COMMUNITY): Admission: EM | Admit: 2006-12-13 | Discharge: 2006-12-13 | Payer: Self-pay | Admitting: Emergency Medicine

## 2006-12-13 ENCOUNTER — Ambulatory Visit: Payer: Self-pay | Admitting: Internal Medicine

## 2006-12-18 ENCOUNTER — Encounter (INDEPENDENT_AMBULATORY_CARE_PROVIDER_SITE_OTHER): Payer: Self-pay | Admitting: *Deleted

## 2007-01-27 ENCOUNTER — Ambulatory Visit: Payer: Self-pay | Admitting: Internal Medicine

## 2007-05-01 ENCOUNTER — Ambulatory Visit: Payer: Self-pay | Admitting: Internal Medicine

## 2007-06-30 ENCOUNTER — Ambulatory Visit: Payer: Self-pay | Admitting: Internal Medicine

## 2007-08-07 ENCOUNTER — Emergency Department (HOSPITAL_COMMUNITY): Admission: EM | Admit: 2007-08-07 | Discharge: 2007-08-07 | Payer: Self-pay | Admitting: Emergency Medicine

## 2007-08-14 ENCOUNTER — Ambulatory Visit: Payer: Self-pay | Admitting: Internal Medicine

## 2007-09-01 ENCOUNTER — Ambulatory Visit: Payer: Self-pay | Admitting: Family Medicine

## 2007-09-02 ENCOUNTER — Ambulatory Visit: Payer: Self-pay | Admitting: Internal Medicine

## 2007-09-18 ENCOUNTER — Ambulatory Visit: Payer: Self-pay | Admitting: Internal Medicine

## 2007-10-16 ENCOUNTER — Ambulatory Visit: Payer: Self-pay | Admitting: Internal Medicine

## 2007-12-04 ENCOUNTER — Ambulatory Visit: Payer: Self-pay | Admitting: Internal Medicine

## 2007-12-09 ENCOUNTER — Encounter: Admission: RE | Admit: 2007-12-09 | Discharge: 2007-12-09 | Payer: Self-pay | Admitting: Internal Medicine

## 2007-12-30 ENCOUNTER — Ambulatory Visit: Payer: Self-pay | Admitting: Internal Medicine

## 2007-12-31 ENCOUNTER — Ambulatory Visit (HOSPITAL_COMMUNITY): Admission: RE | Admit: 2007-12-31 | Discharge: 2007-12-31 | Payer: Self-pay | Admitting: Family Medicine

## 2008-02-03 ENCOUNTER — Ambulatory Visit: Payer: Self-pay | Admitting: Internal Medicine

## 2008-02-04 ENCOUNTER — Encounter (INDEPENDENT_AMBULATORY_CARE_PROVIDER_SITE_OTHER): Payer: Self-pay | Admitting: Internal Medicine

## 2008-02-06 ENCOUNTER — Ambulatory Visit: Payer: Self-pay | Admitting: Internal Medicine

## 2008-02-09 ENCOUNTER — Ambulatory Visit: Payer: Self-pay | Admitting: Internal Medicine

## 2008-03-09 ENCOUNTER — Ambulatory Visit: Payer: Self-pay | Admitting: Internal Medicine

## 2008-03-09 LAB — CONVERTED CEMR LAB
ALT: 28 units/L (ref 0–35)
AST: 19 units/L (ref 0–37)
Albumin: 3.3 g/dL — ABNORMAL LOW (ref 3.5–5.2)
Alkaline Phosphatase: 94 units/L (ref 39–117)
BUN: 17 mg/dL (ref 6–23)
Basophils Absolute: 0 10*3/uL (ref 0.0–0.1)
Basophils Relative: 0 % (ref 0–1)
CO2: 20 meq/L (ref 19–32)
Calcium: 8.6 mg/dL (ref 8.4–10.5)
Chloride: 100 meq/L (ref 96–112)
Creatinine, Ser: 0.77 mg/dL (ref 0.40–1.20)
Eosinophils Absolute: 0.1 10*3/uL (ref 0.0–0.7)
Eosinophils Relative: 1 % (ref 0–5)
Glucose, Bld: 618 mg/dL (ref 70–99)
HCT: 39.5 % (ref 36.0–46.0)
Hemoglobin: 12.7 g/dL (ref 12.0–15.0)
Lymphocytes Relative: 27 % (ref 12–46)
Lymphs Abs: 1.8 10*3/uL (ref 0.7–4.0)
MCHC: 32.2 g/dL (ref 30.0–36.0)
MCV: 82.5 fL (ref 78.0–100.0)
Monocytes Absolute: 0.6 10*3/uL (ref 0.1–1.0)
Monocytes Relative: 10 % (ref 3–12)
Neutro Abs: 4.2 10*3/uL (ref 1.7–7.7)
Neutrophils Relative %: 62 % (ref 43–77)
Platelets: 347 10*3/uL (ref 150–400)
Potassium: 4.6 meq/L (ref 3.5–5.3)
RBC: 4.79 M/uL (ref 3.87–5.11)
RDW: 14.8 % (ref 11.5–15.5)
Sodium: 132 meq/L — ABNORMAL LOW (ref 135–145)
Total Bilirubin: 0.3 mg/dL (ref 0.3–1.2)
Total Protein: 6 g/dL (ref 6.0–8.3)
WBC: 6.7 10*3/uL (ref 4.0–10.5)

## 2008-03-16 ENCOUNTER — Encounter: Payer: Self-pay | Admitting: Internal Medicine

## 2008-03-16 ENCOUNTER — Other Ambulatory Visit: Admission: RE | Admit: 2008-03-16 | Discharge: 2008-03-16 | Payer: Self-pay | Admitting: Internal Medicine

## 2008-03-16 ENCOUNTER — Ambulatory Visit: Payer: Self-pay | Admitting: Internal Medicine

## 2008-03-16 LAB — CONVERTED CEMR LAB
Chlamydia, DNA Probe: NEGATIVE
GC Probe Amp, Genital: NEGATIVE

## 2008-04-15 ENCOUNTER — Ambulatory Visit: Payer: Self-pay | Admitting: Internal Medicine

## 2008-06-24 ENCOUNTER — Ambulatory Visit: Payer: Self-pay | Admitting: Internal Medicine

## 2008-09-16 ENCOUNTER — Ambulatory Visit: Payer: Self-pay | Admitting: Internal Medicine

## 2008-10-07 ENCOUNTER — Ambulatory Visit: Payer: Self-pay | Admitting: Internal Medicine

## 2008-10-07 LAB — CONVERTED CEMR LAB
ALT: 55 units/L — ABNORMAL HIGH (ref 0–35)
AST: 33 units/L (ref 0–37)
Albumin: 3.6 g/dL (ref 3.5–5.2)
Alkaline Phosphatase: 109 units/L (ref 39–117)
BUN: 20 mg/dL (ref 6–23)
CO2: 25 meq/L (ref 19–32)
Calcium: 9.8 mg/dL (ref 8.4–10.5)
Chloride: 98 meq/L (ref 96–112)
Creatinine, Ser: 0.69 mg/dL (ref 0.40–1.20)
Glucose, Bld: 373 mg/dL — ABNORMAL HIGH (ref 70–99)
Potassium: 4.6 meq/L (ref 3.5–5.3)
Sodium: 135 meq/L (ref 135–145)
Total Bilirubin: 0.3 mg/dL (ref 0.3–1.2)
Total Protein: 6.9 g/dL (ref 6.0–8.3)

## 2008-10-25 ENCOUNTER — Ambulatory Visit: Payer: Self-pay | Admitting: Internal Medicine

## 2008-10-29 ENCOUNTER — Inpatient Hospital Stay (HOSPITAL_COMMUNITY): Admission: EM | Admit: 2008-10-29 | Discharge: 2008-10-31 | Payer: Self-pay | Admitting: Emergency Medicine

## 2008-11-01 ENCOUNTER — Ambulatory Visit: Payer: Self-pay | Admitting: Internal Medicine

## 2008-11-30 ENCOUNTER — Ambulatory Visit: Payer: Self-pay | Admitting: Internal Medicine

## 2008-11-30 LAB — CONVERTED CEMR LAB
ALT: 47 units/L — ABNORMAL HIGH (ref 0–35)
AST: 32 units/L (ref 0–37)
Albumin: 3.7 g/dL (ref 3.5–5.2)
Alkaline Phosphatase: 88 units/L (ref 39–117)
BUN: 16 mg/dL (ref 6–23)
Basophils Absolute: 0 10*3/uL (ref 0.0–0.1)
Basophils Relative: 0 % (ref 0–1)
CO2: 19 meq/L (ref 19–32)
Calcium: 9.1 mg/dL (ref 8.4–10.5)
Chloride: 106 meq/L (ref 96–112)
Creatinine, Ser: 0.92 mg/dL (ref 0.40–1.20)
Eosinophils Absolute: 0.1 10*3/uL (ref 0.0–0.7)
Eosinophils Relative: 2 % (ref 0–5)
Glucose, Bld: 330 mg/dL — ABNORMAL HIGH (ref 70–99)
HCT: 37.7 % (ref 36.0–46.0)
Hemoglobin: 12 g/dL (ref 12.0–15.0)
Lymphocytes Relative: 39 % (ref 12–46)
Lymphs Abs: 2.2 10*3/uL (ref 0.7–4.0)
MCHC: 31.8 g/dL (ref 30.0–36.0)
MCV: 75.6 fL — ABNORMAL LOW (ref 78.0–100.0)
Monocytes Absolute: 0.4 10*3/uL (ref 0.1–1.0)
Monocytes Relative: 7 % (ref 3–12)
Neutro Abs: 2.9 10*3/uL (ref 1.7–7.7)
Neutrophils Relative %: 52 % (ref 43–77)
Platelets: 367 10*3/uL (ref 150–400)
Potassium: 4.9 meq/L (ref 3.5–5.3)
RBC: 4.99 M/uL (ref 3.87–5.11)
RDW: 15.9 % — ABNORMAL HIGH (ref 11.5–15.5)
Sodium: 136 meq/L (ref 135–145)
Total Bilirubin: 0.2 mg/dL — ABNORMAL LOW (ref 0.3–1.2)
Total Protein: 6.6 g/dL (ref 6.0–8.3)
WBC: 5.6 10*3/uL (ref 4.0–10.5)

## 2008-12-01 ENCOUNTER — Encounter (INDEPENDENT_AMBULATORY_CARE_PROVIDER_SITE_OTHER): Payer: Self-pay | Admitting: Internal Medicine

## 2008-12-01 LAB — CONVERTED CEMR LAB: Microalb, Ur: 107.86 mg/dL — ABNORMAL HIGH (ref 0.00–1.89)

## 2009-01-24 ENCOUNTER — Encounter (INDEPENDENT_AMBULATORY_CARE_PROVIDER_SITE_OTHER): Payer: Self-pay | Admitting: Adult Health

## 2009-01-24 ENCOUNTER — Ambulatory Visit: Payer: Self-pay | Admitting: Internal Medicine

## 2009-01-24 LAB — CONVERTED CEMR LAB
Basophils Absolute: 0 10*3/uL (ref 0.0–0.1)
Eosinophils Absolute: 0.1 10*3/uL (ref 0.0–0.7)
HCT: 37.6 % (ref 36.0–46.0)
Hemoglobin: 11.7 g/dL — ABNORMAL LOW (ref 12.0–15.0)
Lymphocytes Relative: 12 % (ref 12–46)
Lymphs Abs: 1.2 10*3/uL (ref 0.7–4.0)
MCHC: 31.1 g/dL (ref 30.0–36.0)
MCV: 77.2 fL — ABNORMAL LOW (ref 78.0–100.0)
Monocytes Absolute: 0.6 10*3/uL (ref 0.1–1.0)
Monocytes Relative: 6 % (ref 3–12)
Neutro Abs: 8.3 10*3/uL — ABNORMAL HIGH (ref 1.7–7.7)
Neutrophils Relative %: 82 % — ABNORMAL HIGH (ref 43–77)
Platelets: 504 10*3/uL — ABNORMAL HIGH (ref 150–400)
RBC: 4.87 M/uL (ref 3.87–5.11)
RDW: 14.2 % (ref 11.5–15.5)
WBC: 10.1 10*3/uL (ref 4.0–10.5)

## 2009-02-16 ENCOUNTER — Ambulatory Visit: Payer: Self-pay | Admitting: Internal Medicine

## 2009-02-16 ENCOUNTER — Telehealth (INDEPENDENT_AMBULATORY_CARE_PROVIDER_SITE_OTHER): Payer: Self-pay | Admitting: *Deleted

## 2009-02-17 ENCOUNTER — Ambulatory Visit (HOSPITAL_COMMUNITY): Admission: RE | Admit: 2009-02-17 | Discharge: 2009-02-17 | Payer: Self-pay | Admitting: Internal Medicine

## 2009-02-21 ENCOUNTER — Ambulatory Visit: Payer: Self-pay | Admitting: Family Medicine

## 2009-02-22 ENCOUNTER — Encounter (INDEPENDENT_AMBULATORY_CARE_PROVIDER_SITE_OTHER): Payer: Self-pay | Admitting: Internal Medicine

## 2009-02-23 ENCOUNTER — Ambulatory Visit: Payer: Self-pay | Admitting: Internal Medicine

## 2009-02-23 ENCOUNTER — Emergency Department (HOSPITAL_COMMUNITY): Admission: EM | Admit: 2009-02-23 | Discharge: 2009-02-24 | Payer: Self-pay | Admitting: Emergency Medicine

## 2009-02-24 ENCOUNTER — Encounter (INDEPENDENT_AMBULATORY_CARE_PROVIDER_SITE_OTHER): Payer: Self-pay | Admitting: Adult Health

## 2009-02-28 ENCOUNTER — Emergency Department (HOSPITAL_COMMUNITY): Admission: EM | Admit: 2009-02-28 | Discharge: 2009-02-28 | Payer: Self-pay | Admitting: Emergency Medicine

## 2009-05-04 ENCOUNTER — Ambulatory Visit: Payer: Self-pay | Admitting: Internal Medicine

## 2009-05-27 IMAGING — CR DG CHEST 2V
2 series · 2 of 2 positions shown · non-contrast
Comparison: 08/07/2007

CLINICAL DATA: Cough.  Smoker.

CHEST - 2 VIEW

[w chest pa]
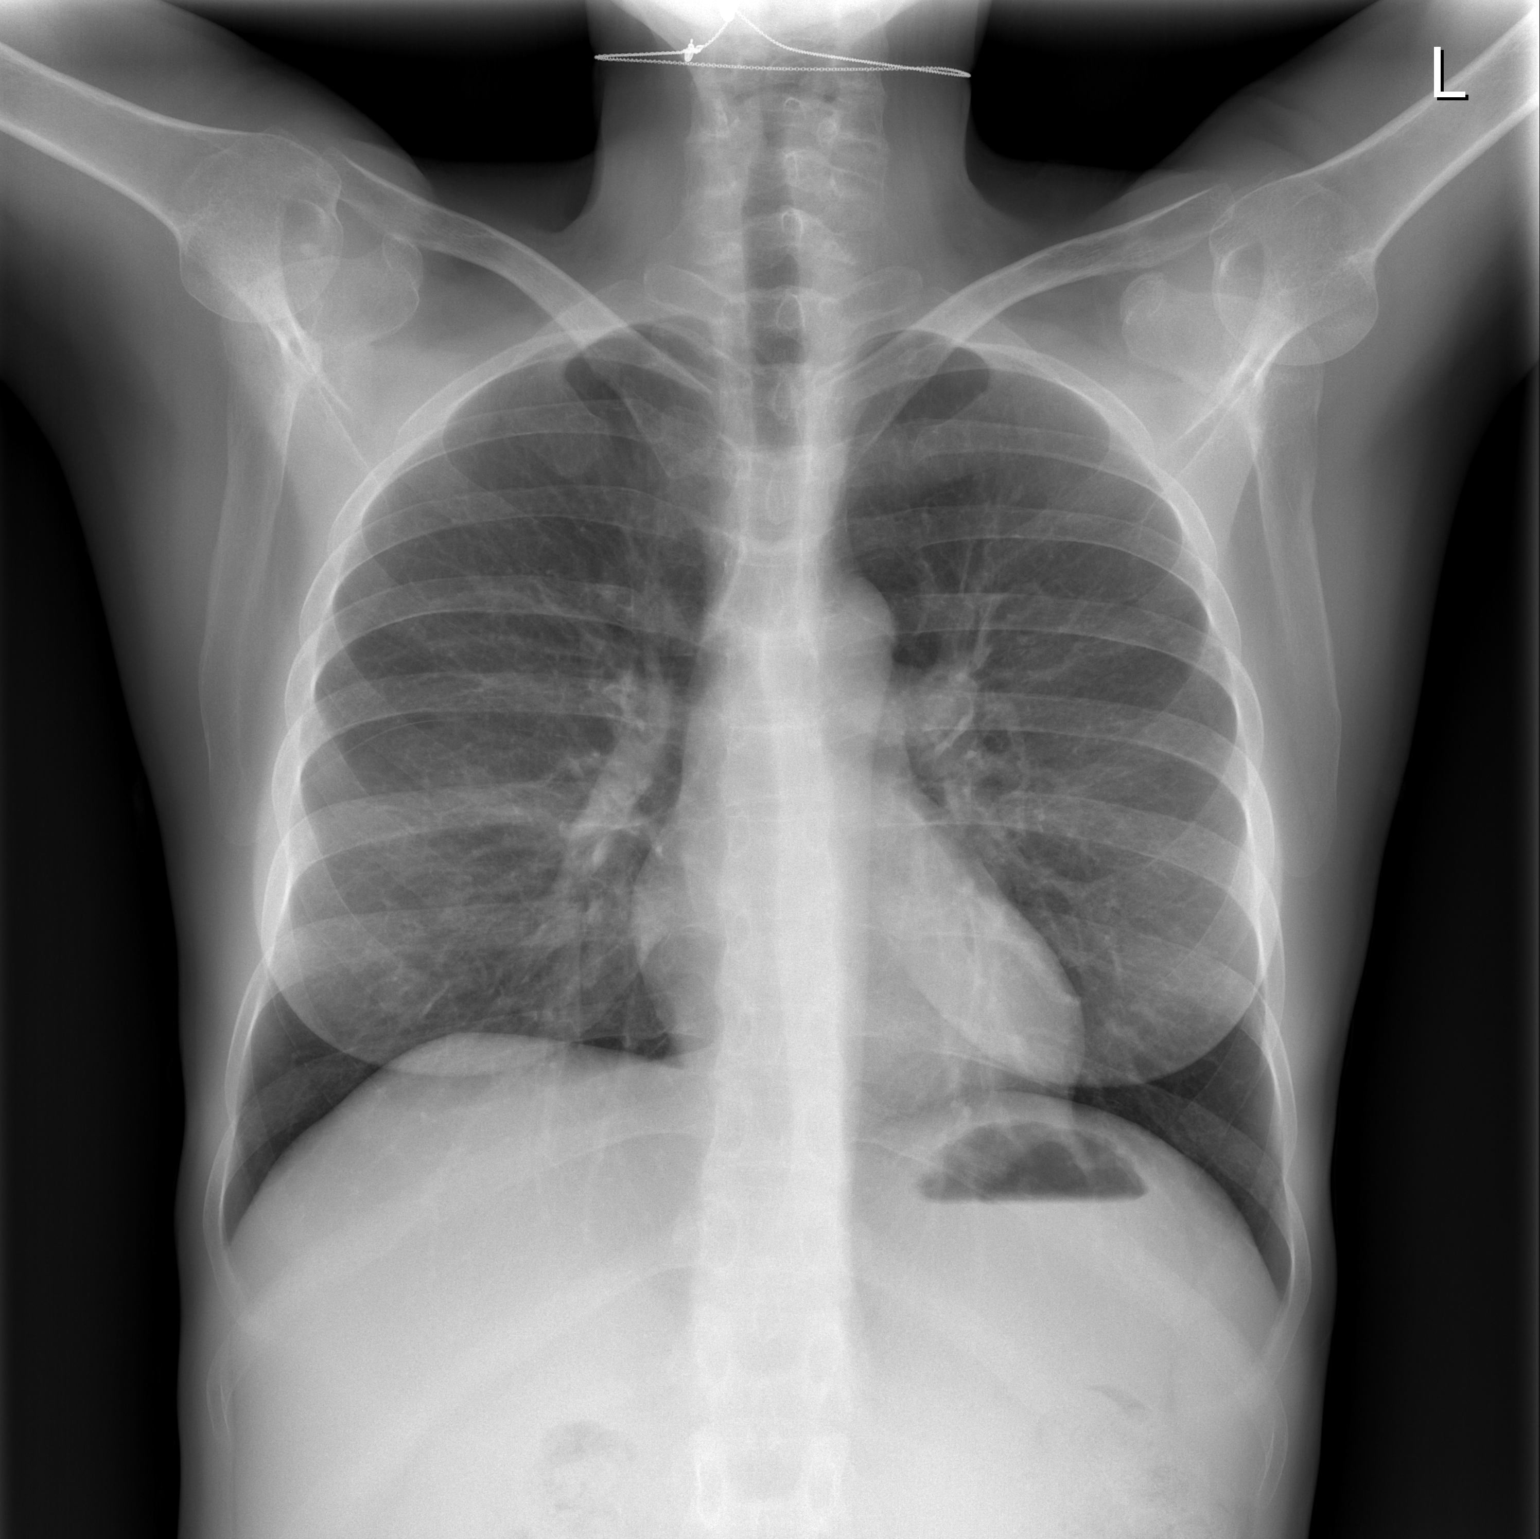

[w chest lat]
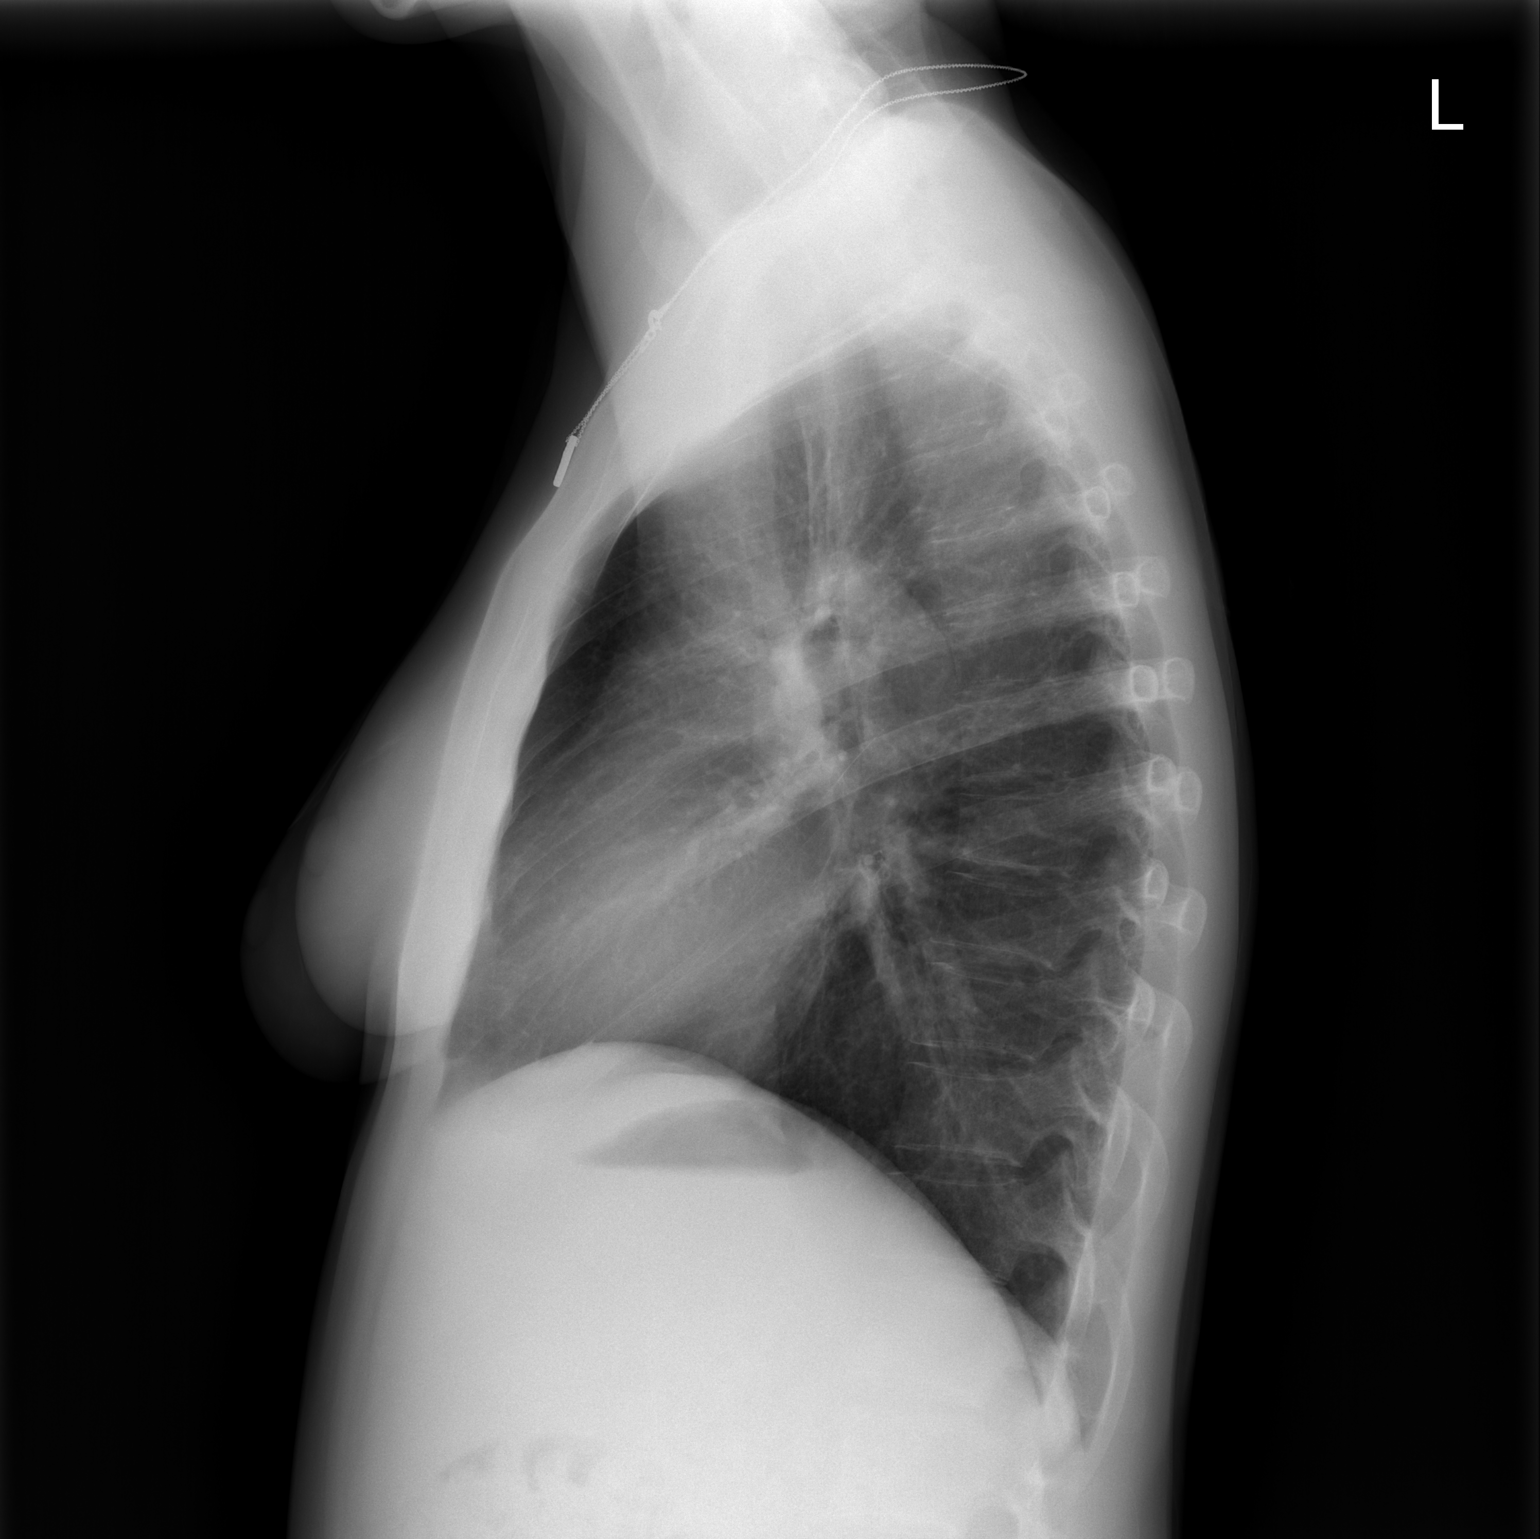

[2 of 2 positions shown; findings below may reference images not displayed]

FINDINGS: The heart size and mediastinal contours are within normal
limits.  Both lungs are clear.  The visualized skeletal structures
are unremarkable.
IMPRESSION: No active cardiopulmonary disease.

## 2009-07-25 ENCOUNTER — Encounter (INDEPENDENT_AMBULATORY_CARE_PROVIDER_SITE_OTHER): Payer: Self-pay | Admitting: Adult Health

## 2009-07-25 ENCOUNTER — Ambulatory Visit: Payer: Self-pay | Admitting: Family Medicine

## 2009-07-25 LAB — CONVERTED CEMR LAB
Calcium: 9.1 mg/dL (ref 8.4–10.5)
Chloride: 101 meq/L (ref 96–112)
Creatinine, Ser: 0.66 mg/dL (ref 0.40–1.20)
Glucose, Bld: 55 mg/dL — ABNORMAL LOW (ref 70–99)
Potassium: 4.3 meq/L (ref 3.5–5.3)
Pro B Natriuretic peptide (BNP): 10.6 pg/mL (ref 0.0–100.0)
Sodium: 139 meq/L (ref 135–145)

## 2009-08-08 ENCOUNTER — Encounter: Admission: RE | Admit: 2009-08-08 | Discharge: 2009-11-06 | Payer: Self-pay | Admitting: Adult Health

## 2009-08-16 ENCOUNTER — Encounter (INDEPENDENT_AMBULATORY_CARE_PROVIDER_SITE_OTHER): Payer: Self-pay | Admitting: Adult Health

## 2009-08-16 ENCOUNTER — Ambulatory Visit: Payer: Self-pay | Admitting: Internal Medicine

## 2009-08-16 LAB — CONVERTED CEMR LAB
ALT: 31 units/L (ref 0–35)
AST: 30 units/L (ref 0–37)
Albumin: 3 g/dL — ABNORMAL LOW (ref 3.5–5.2)
Alkaline Phosphatase: 82 units/L (ref 39–117)
BUN: 16 mg/dL (ref 6–23)
CO2: 18 meq/L — ABNORMAL LOW (ref 19–32)
Calcium: 7.9 mg/dL — ABNORMAL LOW (ref 8.4–10.5)
Chloride: 108 meq/L (ref 96–112)
Cholesterol: 159 mg/dL (ref 0–200)
Creatinine, Ser: 0.58 mg/dL (ref 0.40–1.20)
Glucose, Bld: 194 mg/dL — ABNORMAL HIGH (ref 70–99)
HDL: 23 mg/dL — ABNORMAL LOW (ref >39)
LDL Cholesterol: 88 mg/dL (ref 0–99)
Potassium: 4.3 meq/L (ref 3.5–5.3)
Sodium: 136 meq/L (ref 135–145)
Total Bilirubin: 0.3 mg/dL (ref 0.3–1.2)
Total CHOL/HDL Ratio: 6.9
Total Protein: 5.4 g/dL — ABNORMAL LOW (ref 6.0–8.3)
Triglycerides: 238 mg/dL — ABNORMAL HIGH (ref <150)
VLDL: 48 mg/dL — ABNORMAL HIGH (ref 0–40)

## 2009-08-25 ENCOUNTER — Encounter (INDEPENDENT_AMBULATORY_CARE_PROVIDER_SITE_OTHER): Payer: Self-pay | Admitting: Adult Health

## 2009-08-25 ENCOUNTER — Ambulatory Visit: Payer: Self-pay | Admitting: Internal Medicine

## 2009-08-25 LAB — CONVERTED CEMR LAB
Basophils Absolute: 0 10*3/uL (ref 0.0–0.1)
Basophils Relative: 0 % (ref 0–1)
Eosinophils Absolute: 0.1 10*3/uL (ref 0.0–0.7)
HCT: 37.7 % (ref 36.0–46.0)
Hemoglobin: 12.5 g/dL (ref 12.0–15.0)
Lymphs Abs: 1.7 10*3/uL (ref 0.7–4.0)
MCHC: 33.2 g/dL (ref 30.0–36.0)
MCV: 80.2 fL (ref 78.0–100.0)
Monocytes Absolute: 0.5 10*3/uL (ref 0.1–1.0)
Monocytes Relative: 10 % (ref 3–12)
Neutrophils Relative %: 57 % (ref 43–77)
RBC: 4.7 M/uL (ref 3.87–5.11)
RDW: 14.7 % (ref 11.5–15.5)
WBC: 5.3 10*3/uL (ref 4.0–10.5)

## 2009-08-26 ENCOUNTER — Encounter (INDEPENDENT_AMBULATORY_CARE_PROVIDER_SITE_OTHER): Payer: Self-pay | Admitting: Adult Health

## 2009-09-01 ENCOUNTER — Encounter (INDEPENDENT_AMBULATORY_CARE_PROVIDER_SITE_OTHER): Payer: Self-pay | Admitting: Family Medicine

## 2009-09-01 ENCOUNTER — Ambulatory Visit: Payer: Self-pay | Admitting: Internal Medicine

## 2009-09-01 LAB — CONVERTED CEMR LAB
ALT: 40 units/L — ABNORMAL HIGH (ref 0–35)
AST: 21 units/L (ref 0–37)
Albumin: 3.3 g/dL — ABNORMAL LOW (ref 3.5–5.2)
Alkaline Phosphatase: 114 units/L (ref 39–117)
BUN: 17 mg/dL (ref 6–23)
CO2: 18 meq/L — ABNORMAL LOW (ref 19–32)
Calcium Ionized: 1.37 mmol/L — ABNORMAL HIGH (ref 1.12–1.32)
Calcium, Total (PTH): 8.5 mg/dL (ref 8.4–10.5)
Calcium: 8.5 mg/dL (ref 8.4–10.5)
Chloride: 110 meq/L (ref 96–112)
Creatinine, Ser: 0.86 mg/dL (ref 0.40–1.20)
Glucose, Bld: 127 mg/dL — ABNORMAL HIGH (ref 70–99)
Magnesium: 1.7 mg/dL (ref 1.5–2.5)
PTH: 74.4 pg/mL — ABNORMAL HIGH (ref 14.0–72.0)
Phosphorus: 4.1 mg/dL (ref 2.3–4.6)
Potassium: 4.7 meq/L (ref 3.5–5.3)
Sed Rate: 41 mm/hr — ABNORMAL HIGH (ref 0–22)
TSH: 3.052 microintl units/mL (ref 0.350–4.500)
Total Protein: 6 g/dL (ref 6.0–8.3)

## 2009-09-08 ENCOUNTER — Encounter (INDEPENDENT_AMBULATORY_CARE_PROVIDER_SITE_OTHER): Payer: Self-pay | Admitting: Family Medicine

## 2009-10-27 ENCOUNTER — Ambulatory Visit: Payer: Self-pay | Admitting: Internal Medicine

## 2009-11-01 ENCOUNTER — Ambulatory Visit (HOSPITAL_COMMUNITY): Admission: RE | Admit: 2009-11-01 | Discharge: 2009-11-01 | Payer: Self-pay | Admitting: Internal Medicine

## 2009-11-29 ENCOUNTER — Ambulatory Visit: Payer: Self-pay | Admitting: Internal Medicine

## 2009-12-30 ENCOUNTER — Encounter (INDEPENDENT_AMBULATORY_CARE_PROVIDER_SITE_OTHER): Payer: Self-pay | Admitting: Internal Medicine

## 2009-12-30 LAB — CONVERTED CEMR LAB
ALT: 58 units/L — ABNORMAL HIGH (ref 0–35)
AST: 49 units/L — ABNORMAL HIGH (ref 0–37)
Albumin: 3.2 g/dL — ABNORMAL LOW (ref 3.5–5.2)
Alkaline Phosphatase: 135 units/L — ABNORMAL HIGH (ref 39–117)
BUN: 20 mg/dL (ref 6–23)
Basophils Absolute: 0 10*3/uL (ref 0.0–0.1)
Basophils Relative: 0 % (ref 0–1)
CO2: 19 meq/L (ref 19–32)
Calcium: 8.1 mg/dL — ABNORMAL LOW (ref 8.4–10.5)
Chloride: 105 meq/L (ref 96–112)
Creatinine, Ser: 0.82 mg/dL (ref 0.40–1.20)
Eosinophils Absolute: 0.1 10*3/uL (ref 0.0–0.7)
Eosinophils Relative: 1 % (ref 0–5)
Glucose, Bld: 308 mg/dL — ABNORMAL HIGH (ref 70–99)
HCT: 37.2 % (ref 36.0–46.0)
Hemoglobin: 11.7 g/dL — ABNORMAL LOW (ref 12.0–15.0)
Hgb A1c MFr Bld: 13.6 % — ABNORMAL HIGH (ref <5.7)
Lymphocytes Relative: 24 % (ref 12–46)
Lymphs Abs: 1.6 10*3/uL (ref 0.7–4.0)
MCHC: 31.5 g/dL (ref 30.0–36.0)
MCV: 82.5 fL (ref 78.0–100.0)
Microalb, Ur: 41.02 mg/dL — ABNORMAL HIGH (ref 0.00–1.89)
Monocytes Absolute: 0.5 10*3/uL (ref 0.1–1.0)
Monocytes Relative: 8 % (ref 3–12)
Neutro Abs: 4.5 10*3/uL (ref 1.7–7.7)
Neutrophils Relative %: 67 % (ref 43–77)
Platelets: 406 10*3/uL — ABNORMAL HIGH (ref 150–400)
Potassium: 5 meq/L (ref 3.5–5.3)
RBC: 4.51 M/uL (ref 3.87–5.11)
RDW: 14.9 % (ref 11.5–15.5)
Sodium: 139 meq/L (ref 135–145)
Total Bilirubin: 0.2 mg/dL — ABNORMAL LOW (ref 0.3–1.2)
Total Protein: 6 g/dL (ref 6.0–8.3)
WBC: 6.8 10*3/uL (ref 4.0–10.5)

## 2010-03-26 IMAGING — CR DG TIBIA/FIBULA 2V*R*
4 series · 4 of 4 positions shown · non-contrast
Comparison: None

CLINICAL DATA: Injury, pain, redness, diabetes

RIGHT TIBIA AND FIBULA - 2 VIEW

[t tib/fib ap right (1 of 2)]
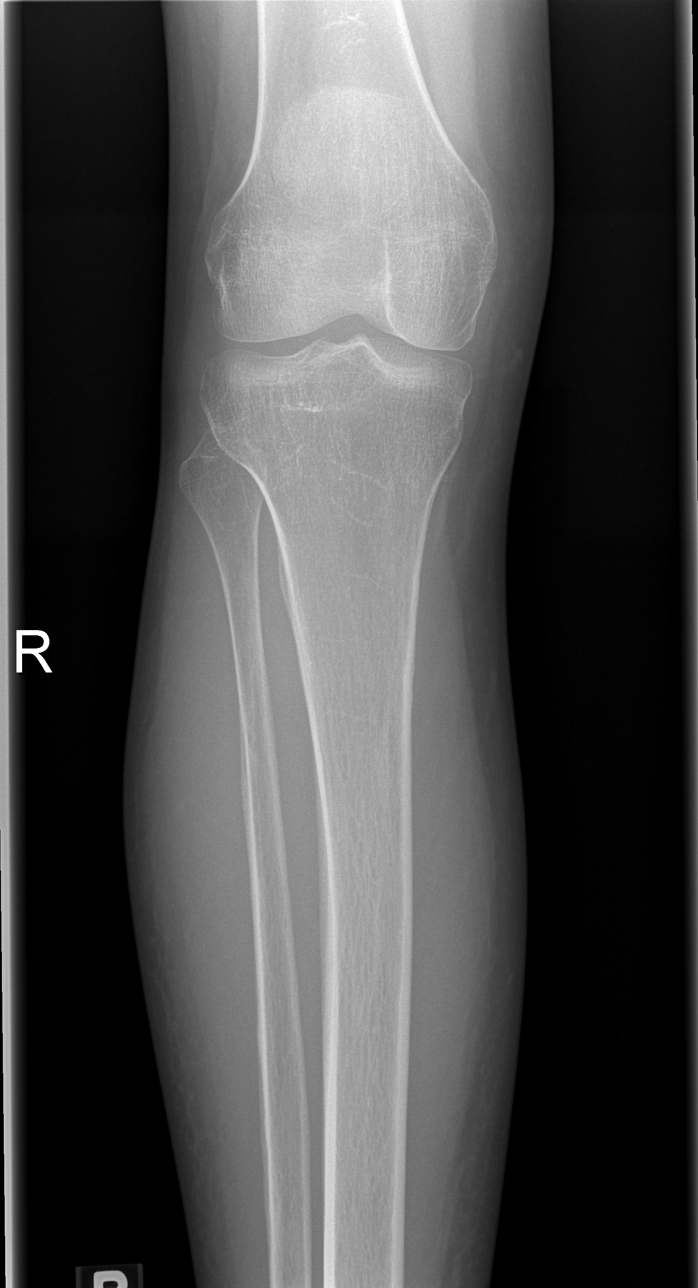

[t tib/fib ap right (2 of 2)]
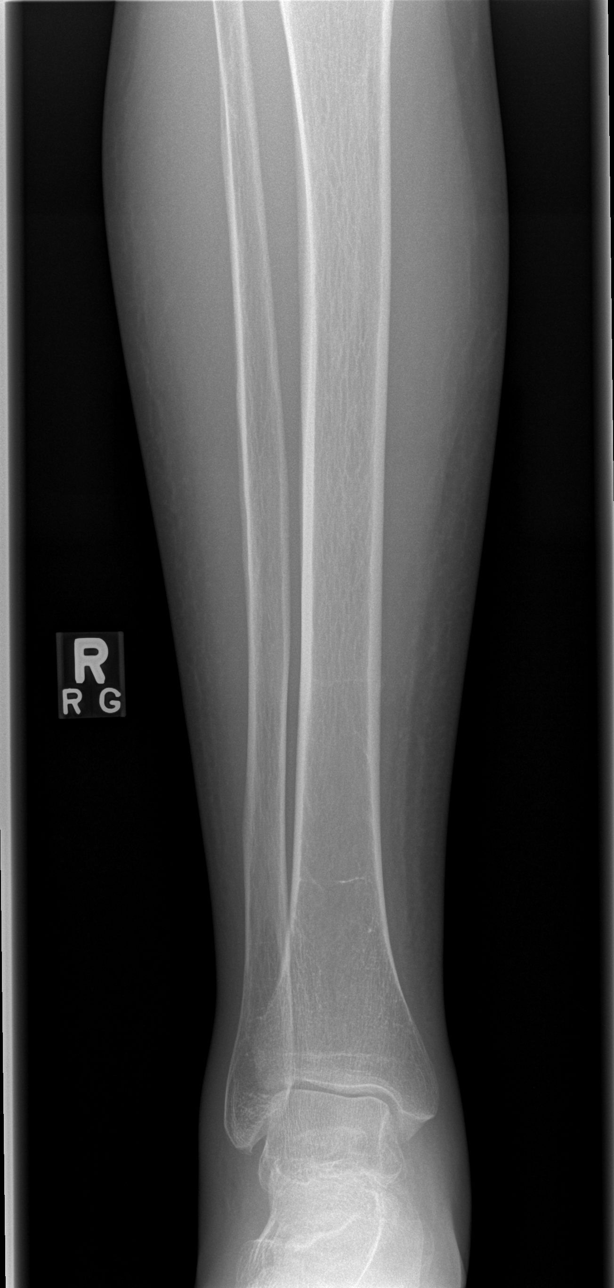

[t tib/fib lat right (1 of 2)]
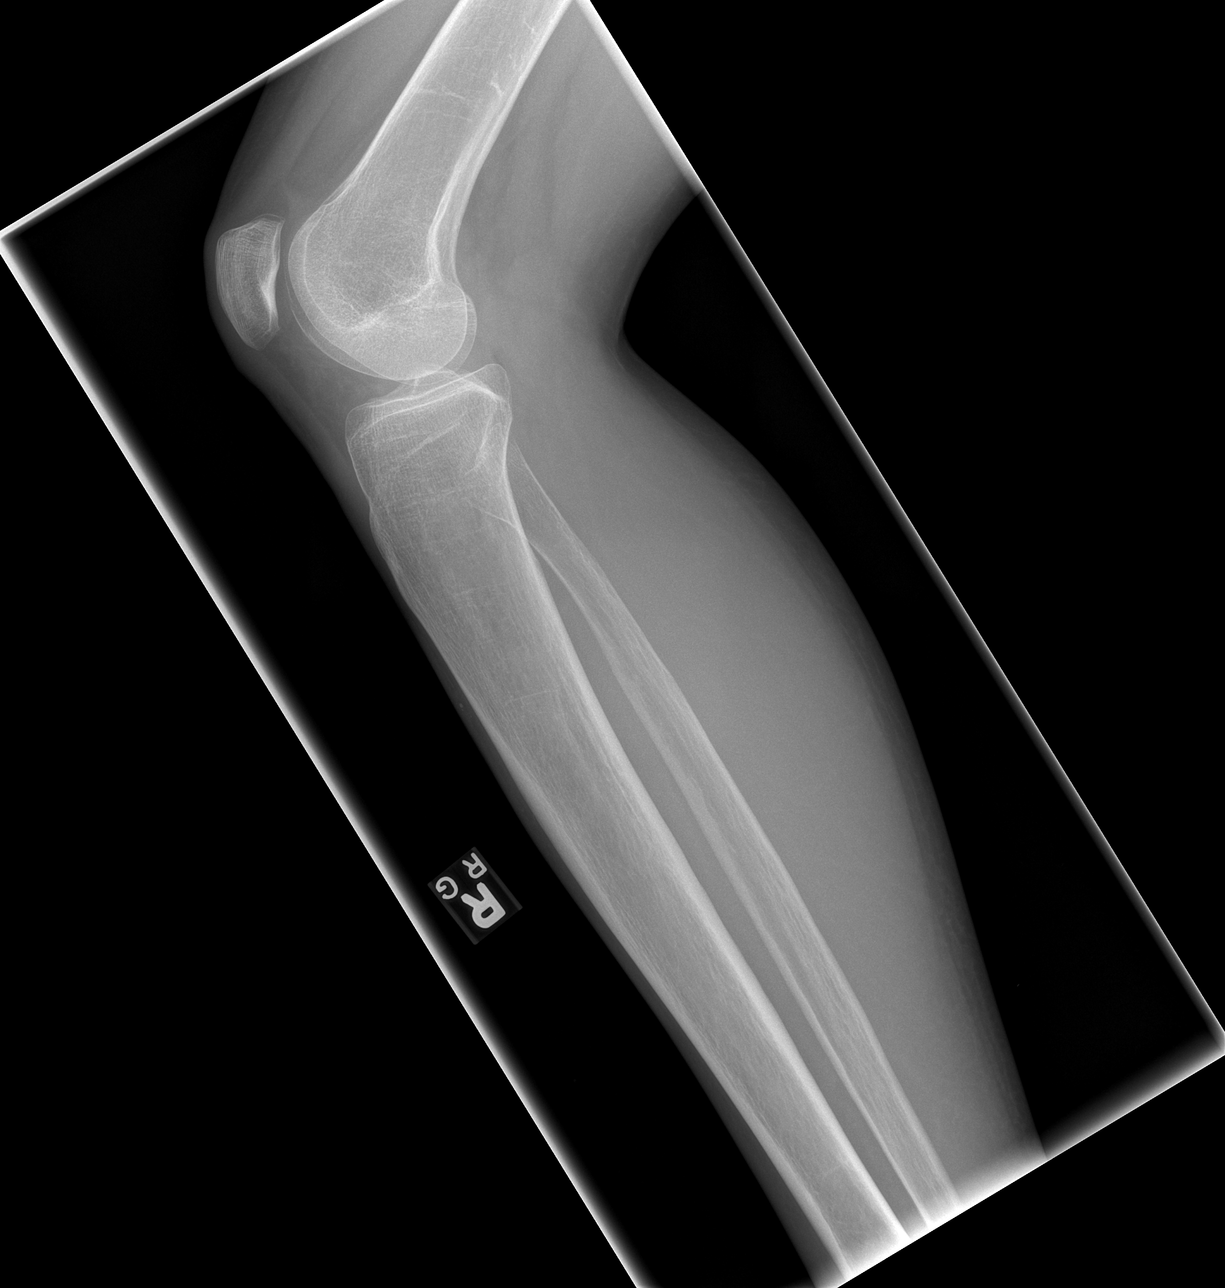

[t tib/fib lat right (2 of 2)]
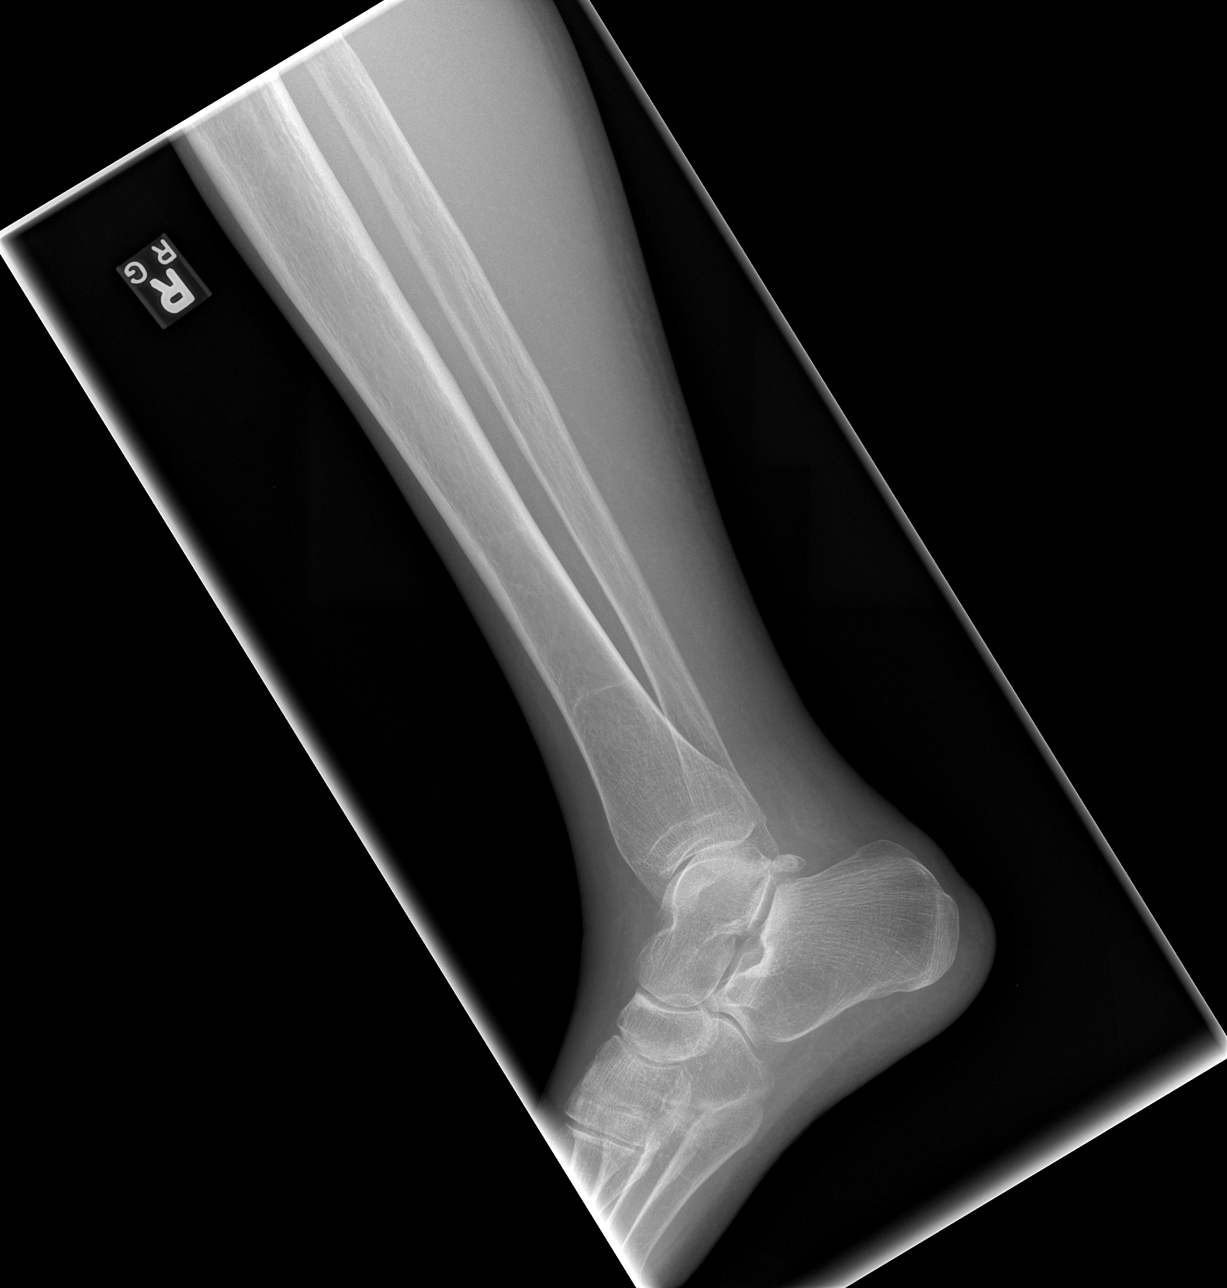

[4 of 4 positions shown; findings below may reference images not displayed]

FINDINGS: Marked bony demineralization.
Knee and ankle joint alignments normal.
Soft tissue swelling at ankle.
No acute fracture, dislocation, or bone destruction.
IMPRESSION: Bony demineralization.
No acute bony abnormalities.

## 2010-03-26 IMAGING — CR DG FOOT COMPLETE 3+V*R*
3 series · 3 of 3 positions shown · non-contrast
Comparison: None

CLINICAL DATA: Injury, pain, redness, diabetes

RIGHT FOOT COMPLETE - 3+ VIEW

[t foot ap right]
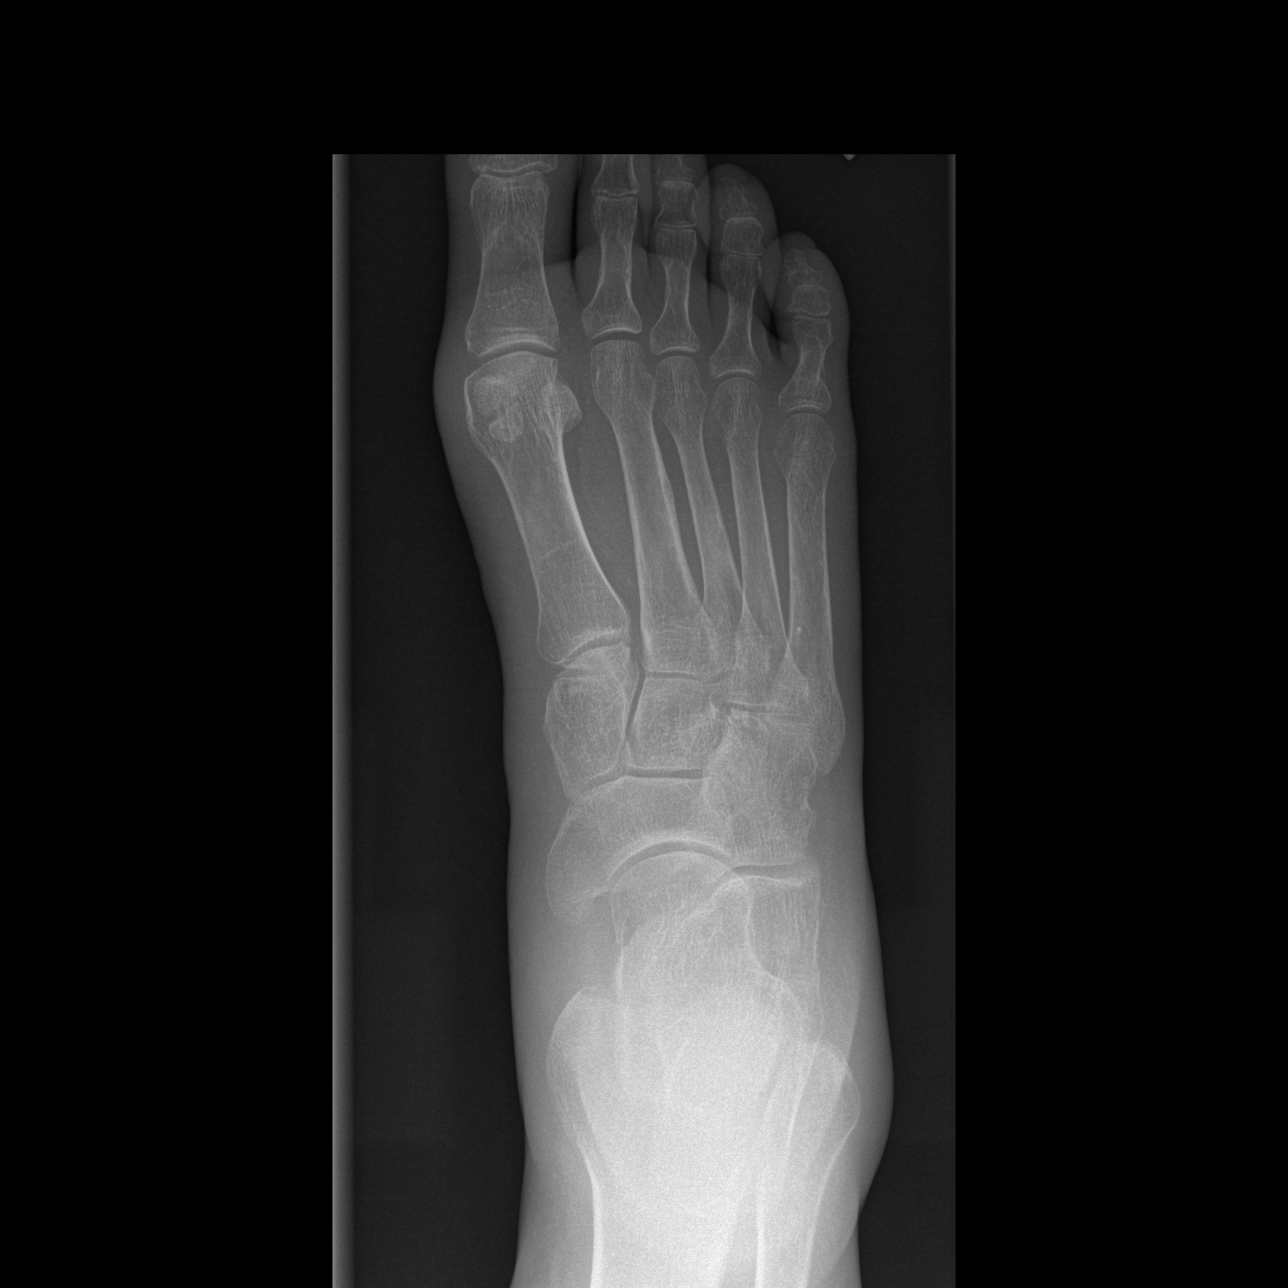

[t foot oblique right]
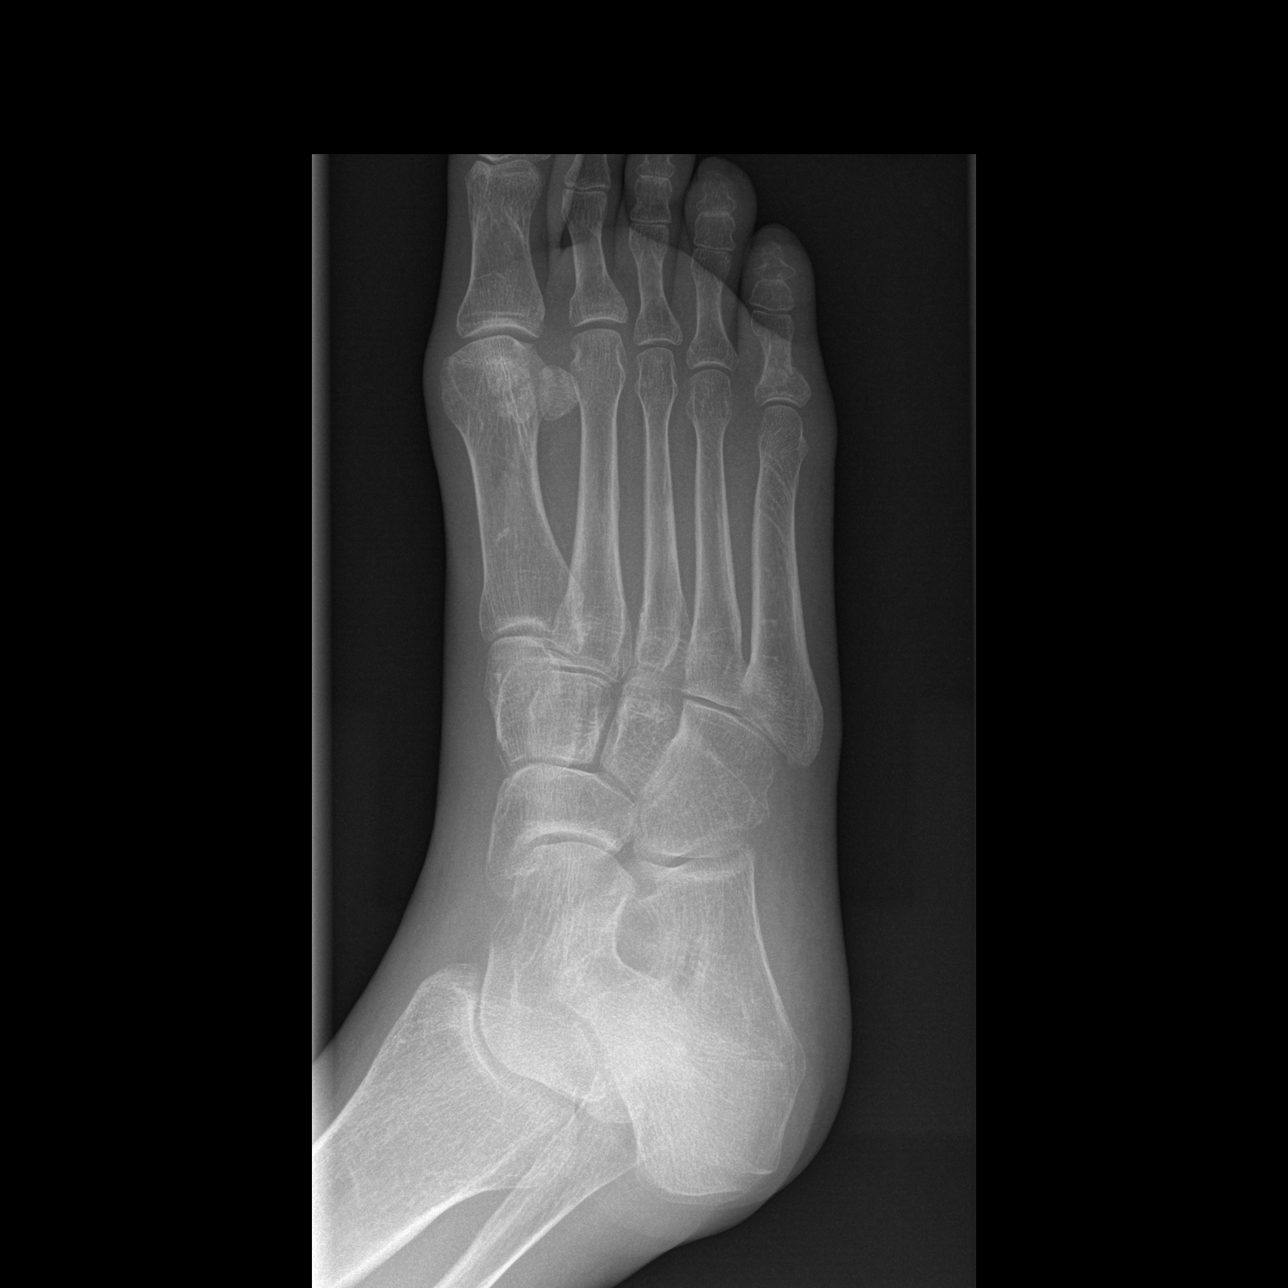

[t foot lat right]
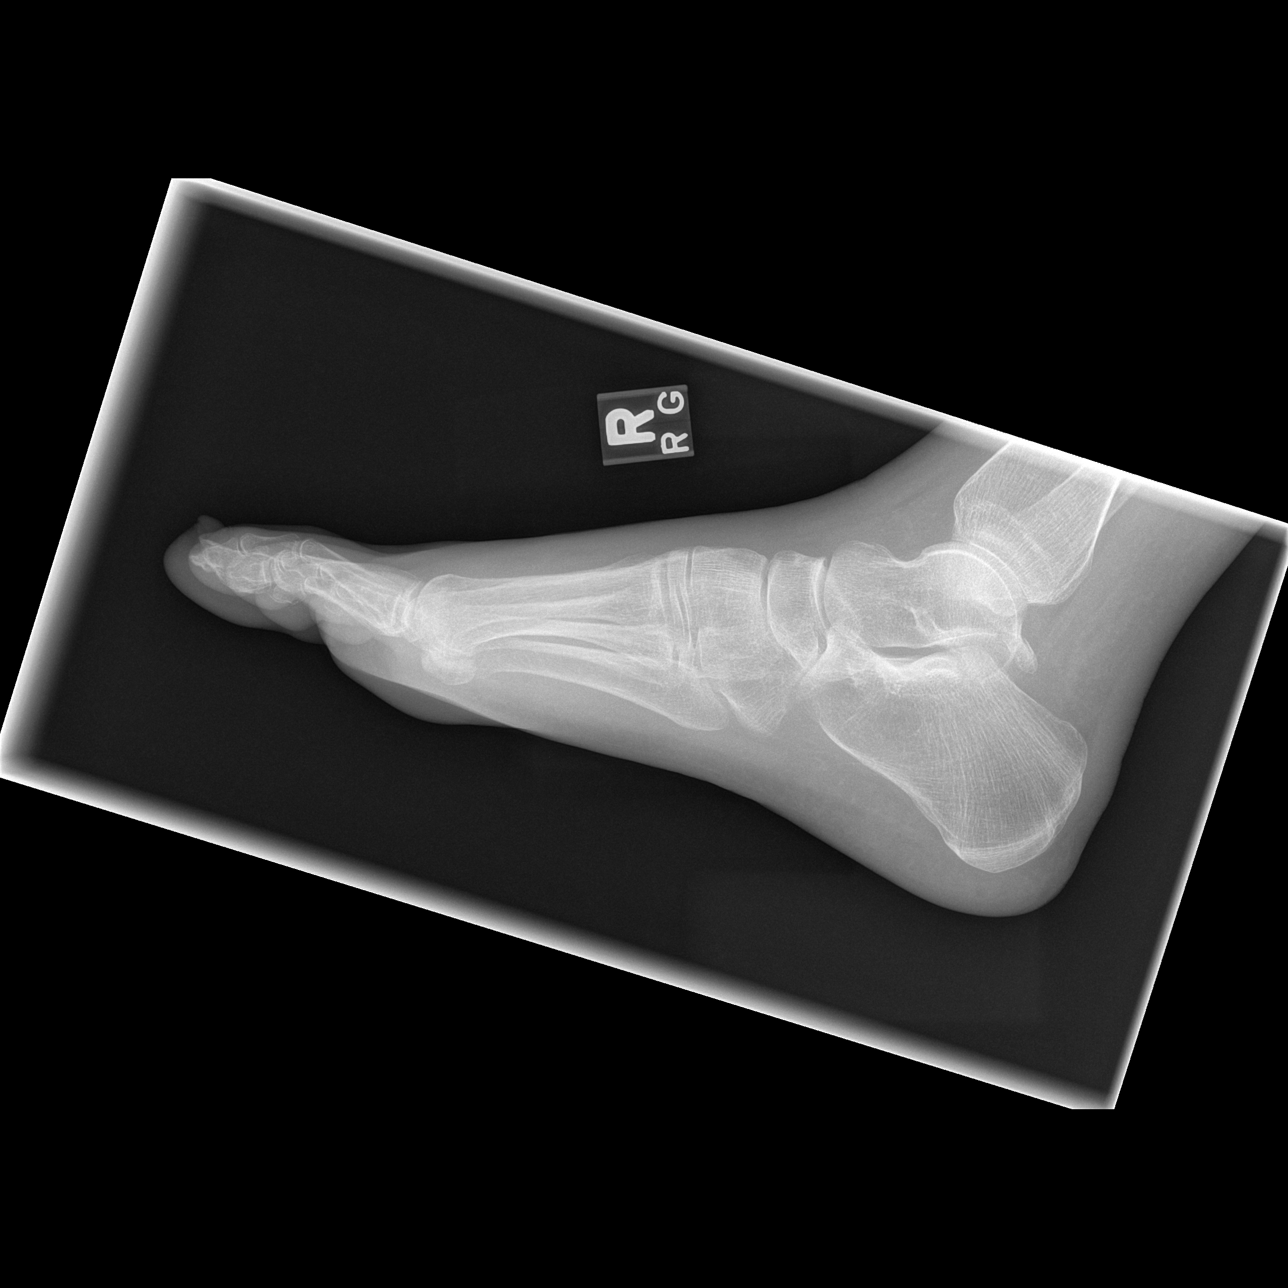

[3 of 3 positions shown; findings below may reference images not displayed]

FINDINGS: Significant bony demineralization.
Joint spaces preserved.
Essentially nondisplaced fracture at base of proximal phalanx fifth
toe.
No additional fracture or dislocation.
Scattered soft tissue swelling.
IMPRESSION: Nondisplaced fracture, proximal phalanx right fifth toe.

## 2010-05-26 ENCOUNTER — Encounter (INDEPENDENT_AMBULATORY_CARE_PROVIDER_SITE_OTHER): Payer: Self-pay | Admitting: Family Medicine

## 2010-05-26 LAB — CONVERTED CEMR LAB
ALT: 30 units/L (ref 0–35)
Albumin: 3 g/dL — ABNORMAL LOW (ref 3.5–5.2)
Alkaline Phosphatase: 140 units/L — ABNORMAL HIGH (ref 39–117)
BUN: 26 mg/dL — ABNORMAL HIGH (ref 6–23)
CO2: 26 meq/L (ref 19–32)
Calcium: 8.9 mg/dL (ref 8.4–10.5)
Chloride: 100 meq/L (ref 96–112)
Cholesterol: 284 mg/dL — ABNORMAL HIGH (ref 0–200)
Creatinine, Ser: 0.78 mg/dL (ref 0.40–1.20)
Glucose, Bld: 60 mg/dL — ABNORMAL LOW (ref 70–99)
LDL Cholesterol: 199 mg/dL — ABNORMAL HIGH (ref 0–99)
Sed Rate: 65 mm/hr — ABNORMAL HIGH (ref 0–22)
Sodium: 136 meq/L (ref 135–145)
TSH: 4.921 microintl units/mL — ABNORMAL HIGH (ref 0.350–4.500)
Total Bilirubin: 0.2 mg/dL — ABNORMAL LOW (ref 0.3–1.2)
Total CHOL/HDL Ratio: 6.8
Total Protein: 6 g/dL (ref 6.0–8.3)
Triglycerides: 213 mg/dL — ABNORMAL HIGH (ref <150)
VLDL: 43 mg/dL — ABNORMAL HIGH (ref 0–40)

## 2010-07-05 LAB — WOUND CULTURE

## 2010-07-05 LAB — DIFFERENTIAL
Lymphocytes Relative: 24 % (ref 12–46)
Lymphs Abs: 1.6 10*3/uL (ref 0.7–4.0)
Monocytes Relative: 10 % (ref 3–12)
Neutro Abs: 4.1 10*3/uL (ref 1.7–7.7)
Neutrophils Relative %: 64 % (ref 43–77)

## 2010-07-05 LAB — BASIC METABOLIC PANEL
Calcium: 8.7 mg/dL (ref 8.4–10.5)
Creatinine, Ser: 1.11 mg/dL (ref 0.4–1.2)
GFR calc Af Amer: >60 mL/min (ref >60)

## 2010-07-05 LAB — CBC
RBC: 4.73 MIL/uL (ref 3.87–5.11)
WBC: 6.4 10*3/uL (ref 4.0–10.5)

## 2010-07-05 LAB — GLUCOSE, CAPILLARY: Glucose-Capillary: 119 mg/dL — ABNORMAL HIGH (ref 70–99)

## 2010-07-08 LAB — BASIC METABOLIC PANEL
Chloride: 102 mEq/L (ref 96–112)
Creatinine, Ser: 0.86 mg/dL (ref 0.4–1.2)
GFR calc Af Amer: >60 mL/min (ref >60)
Potassium: 4.9 mEq/L (ref 3.5–5.1)

## 2010-07-08 LAB — CBC
MCV: 76.9 fL — ABNORMAL LOW (ref 78.0–100.0)
RBC: 4.14 MIL/uL (ref 3.87–5.11)
WBC: 7.4 10*3/uL (ref 4.0–10.5)

## 2010-07-08 LAB — GLUCOSE, CAPILLARY: Glucose-Capillary: 165 mg/dL — ABNORMAL HIGH (ref 70–99)

## 2010-07-09 LAB — COMPREHENSIVE METABOLIC PANEL
ALT: 28 U/L (ref 0–35)
ALT: 30 U/L (ref 0–35)
AST: 38 U/L — ABNORMAL HIGH (ref 0–37)
Albumin: 2.9 g/dL — ABNORMAL LOW (ref 3.5–5.2)
Alkaline Phosphatase: 93 U/L (ref 39–117)
BUN: 14 mg/dL (ref 6–23)
CO2: 26 mEq/L (ref 19–32)
CO2: 27 mEq/L (ref 19–32)
Calcium: 7.7 mg/dL — ABNORMAL LOW (ref 8.4–10.5)
Calcium: 9.1 mg/dL (ref 8.4–10.5)
GFR calc Af Amer: >60 mL/min (ref >60)
GFR calc non Af Amer: >60 mL/min (ref >60)
Glucose, Bld: 123 mg/dL — ABNORMAL HIGH (ref 70–99)
Potassium: 4.5 mEq/L (ref 3.5–5.1)
Sodium: 132 mEq/L — ABNORMAL LOW (ref 135–145)
Sodium: 133 mEq/L — ABNORMAL LOW (ref 135–145)
Total Protein: 7.1 g/dL (ref 6.0–8.3)

## 2010-07-09 LAB — C-REACTIVE PROTEIN: CRP: 2.7 mg/dL — ABNORMAL HIGH (ref <0.6)

## 2010-07-09 LAB — GLUCOSE, CAPILLARY
Glucose-Capillary: 120 mg/dL — ABNORMAL HIGH (ref 70–99)
Glucose-Capillary: 188 mg/dL — ABNORMAL HIGH (ref 70–99)
Glucose-Capillary: 252 mg/dL — ABNORMAL HIGH (ref 70–99)
Glucose-Capillary: 45 mg/dL — ABNORMAL LOW (ref 70–99)
Glucose-Capillary: 74 mg/dL (ref 70–99)
Glucose-Capillary: >600 mg/dL (ref 70–99)

## 2010-07-09 LAB — DIFFERENTIAL
Eosinophils Absolute: 0.1 10*3/uL (ref 0.0–0.7)
Eosinophils Relative: 1 % (ref 0–5)
Lymphs Abs: 2.2 10*3/uL (ref 0.7–4.0)
Monocytes Absolute: 0.7 10*3/uL (ref 0.1–1.0)
Monocytes Relative: 8 % (ref 3–12)

## 2010-07-09 LAB — CULTURE, BLOOD (ROUTINE X 2): Culture: NO GROWTH

## 2010-07-09 LAB — CBC
MCHC: 31.9 g/dL (ref 30.0–36.0)
Platelets: 458 10*3/uL — ABNORMAL HIGH (ref 150–400)
RBC: 5.13 MIL/uL — ABNORMAL HIGH (ref 3.87–5.11)
RDW: 19 % — ABNORMAL HIGH (ref 11.5–15.5)

## 2010-07-09 LAB — HEMOGLOBIN A1C: Mean Plasma Glucose: 332 mg/dL

## 2010-07-15 IMAGING — CR DG SHOULDER 2+V*R*
3 series · 3 of 3 positions shown · non-contrast
Comparison: None available.

CLINICAL DATA: Status post fall.

RIGHT SHOULDER - 2+ VIEW

[w shoulder ap internal righ]
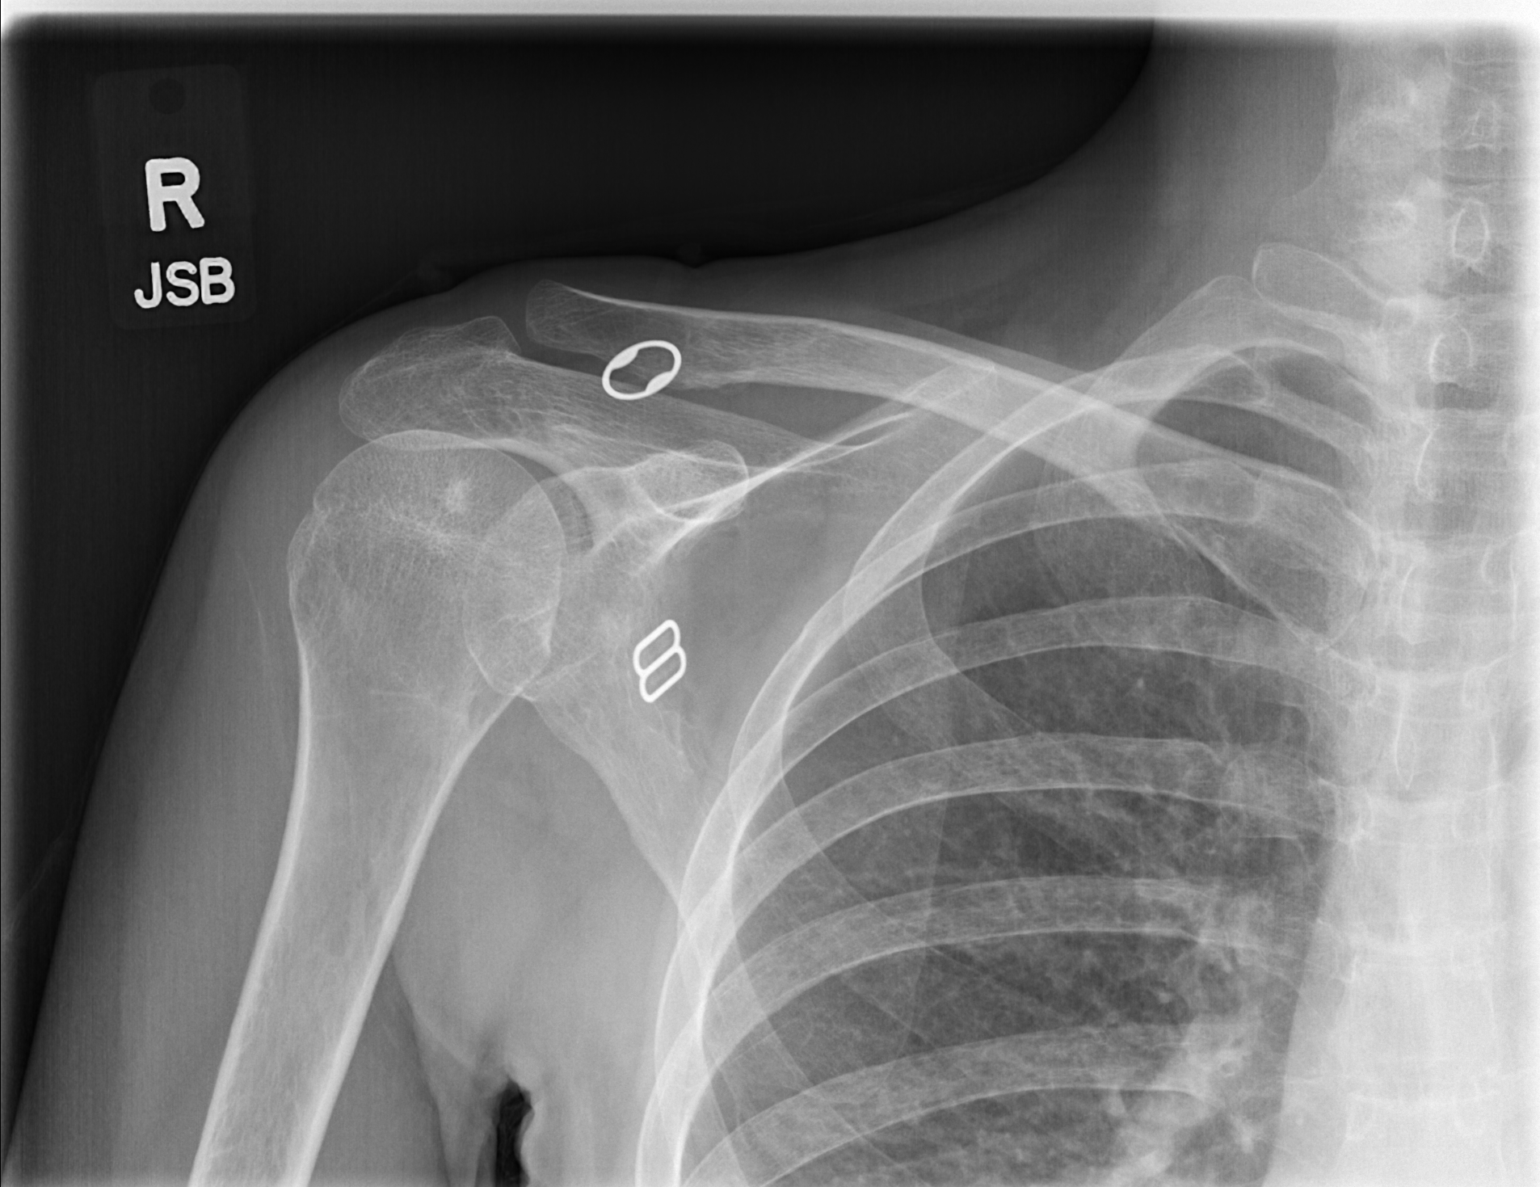

[w shoulder ap external righ]
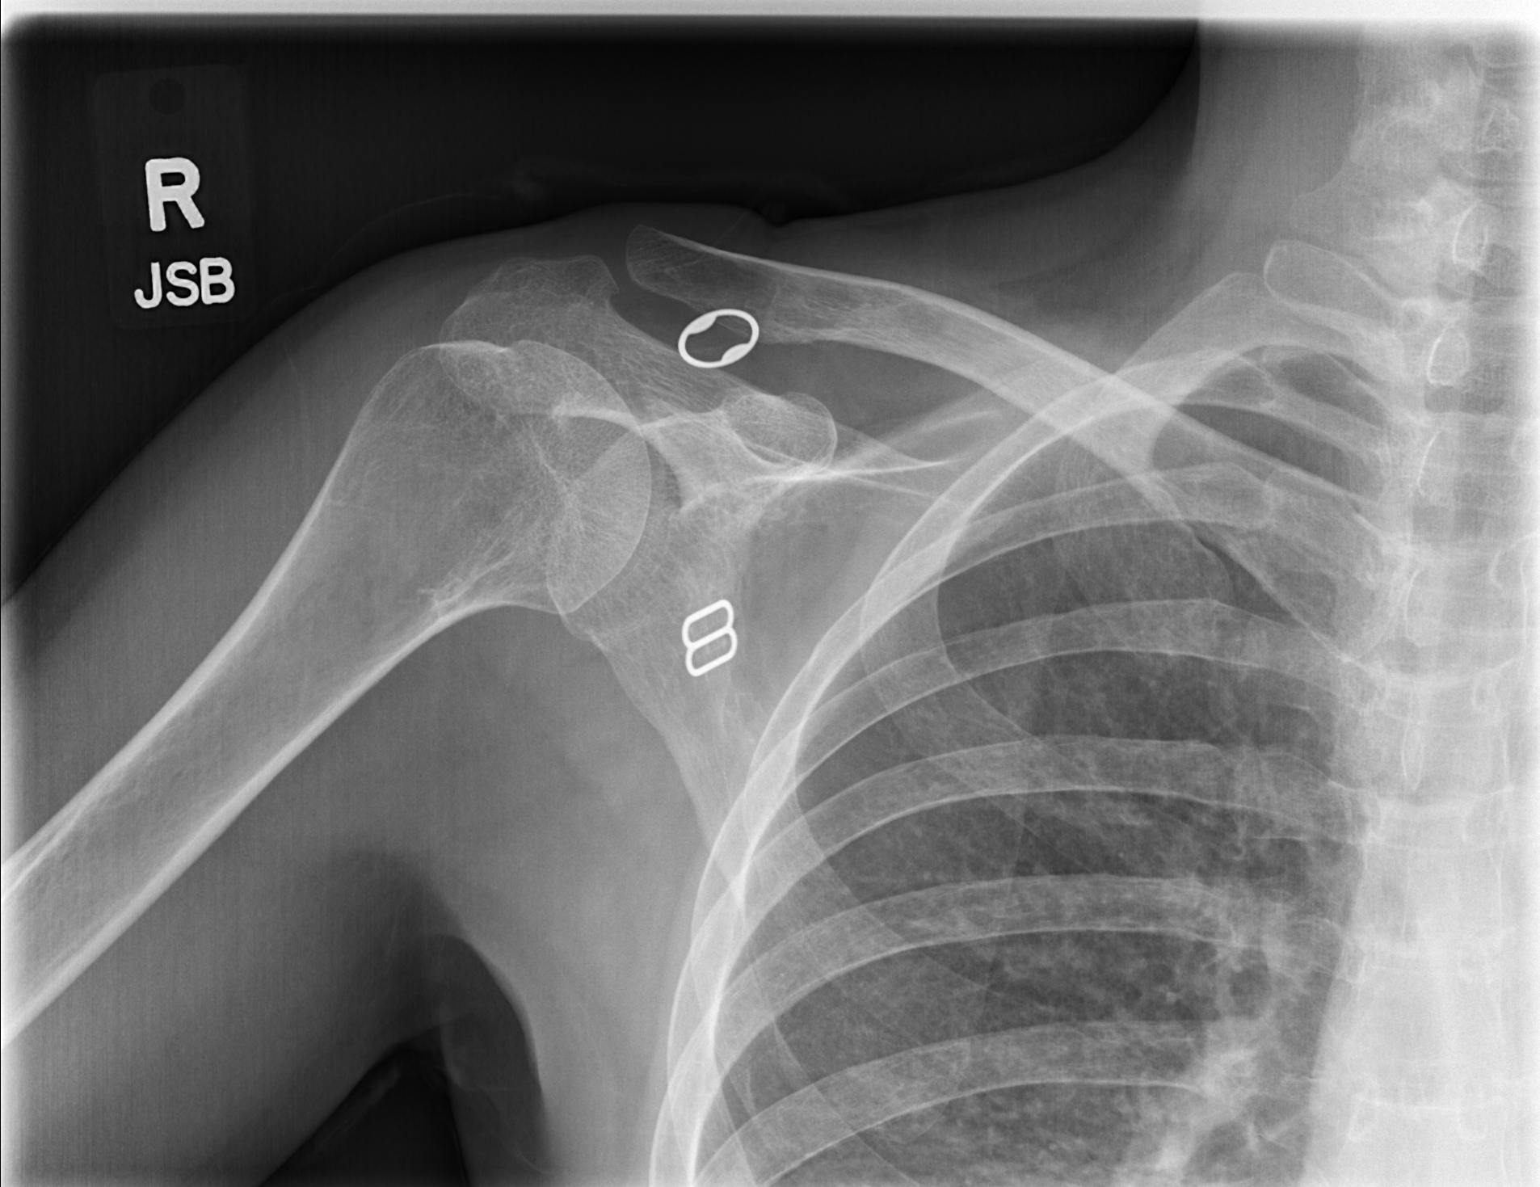

[w shoulder y view right]
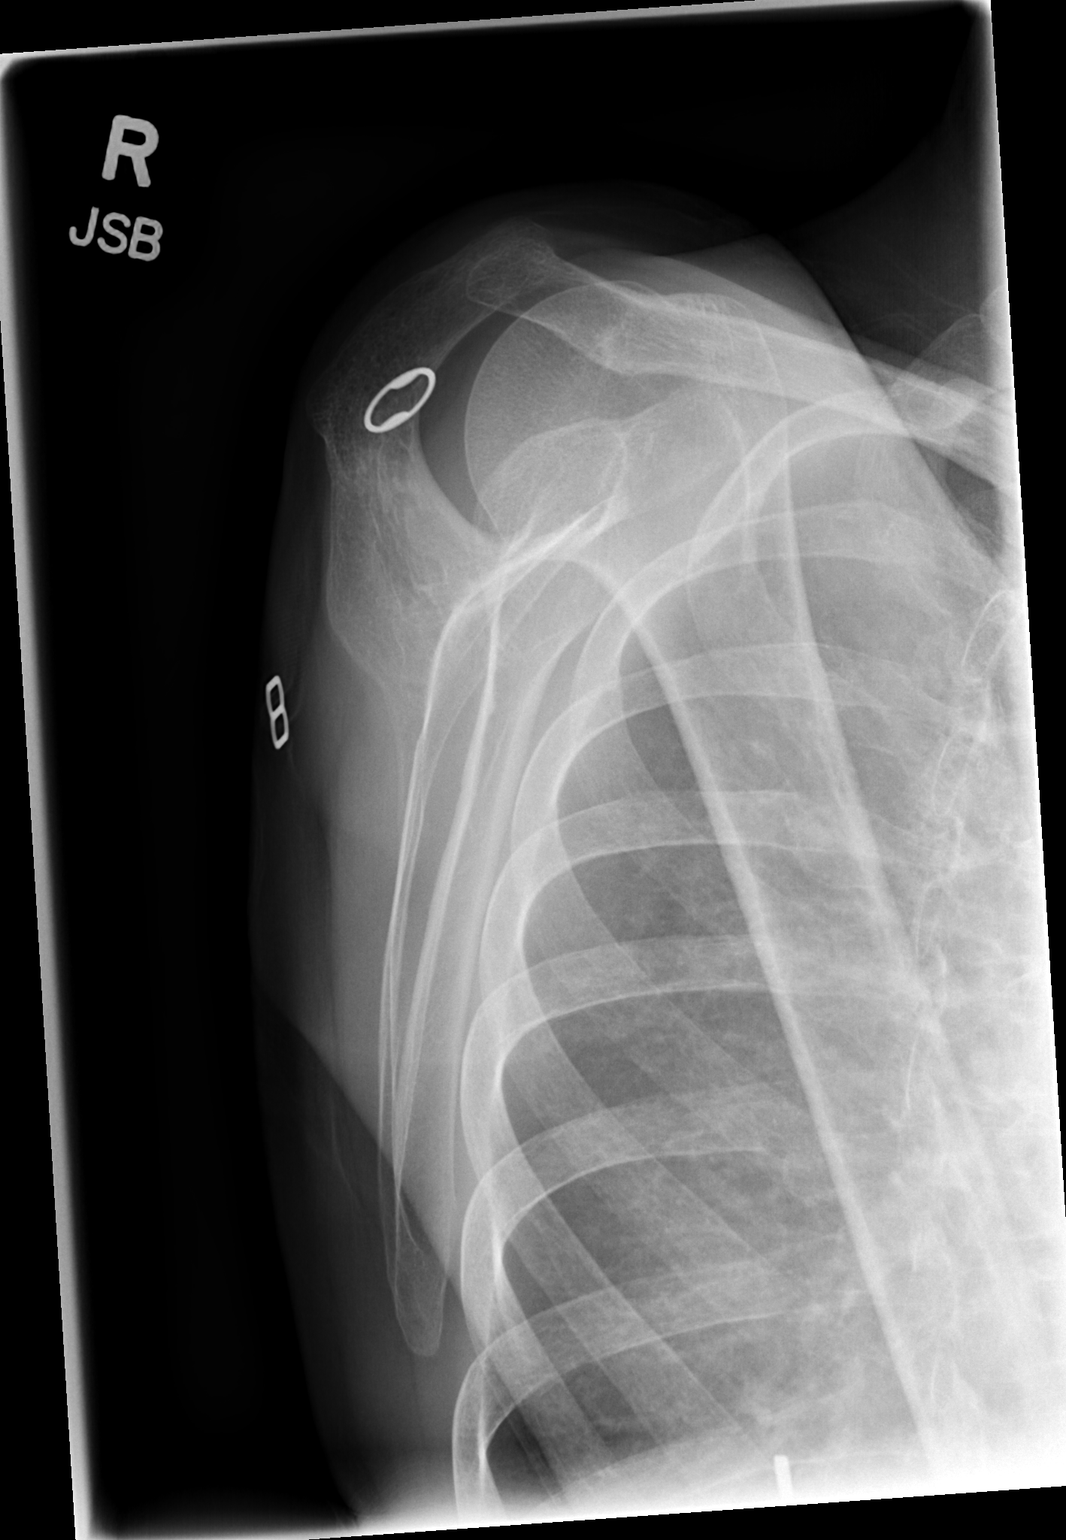

[3 of 3 positions shown; findings below may reference images not displayed]

FINDINGS: The humerus is located.  The acromioclavicular joint is
intact.  No fracture is identified.  Imaged right lung and ribs
appear normal.
IMPRESSION: No acute abnormality.

## 2010-08-15 NOTE — H&P (Signed)
NAMESEYCHELLE, [name] NO.:  [id number]   MEDICAL RECORD NO.:  [id number]           PATIENT TYPE:   LOCATION:                                 FACILITY:   PHYSICIAN:  Arlyss Repress, MD        DATE OF BIRTH:  [birth year]   DATE OF ADMISSION:  10/29/2008  DATE OF DISCHARGE:                              HISTORY & PHYSICAL   CHIEF COMPLAINT:  Right foot pain.   HISTORY OF PRESENT ILLNESS:  A 40 year old female with a history of  diabetes who complains of right foot pain.  It is on the dorsum of her  foot.  She reports her daughter jumping on her foot last week starting  about Thursday.  The patient was apparently seen by her PCP on Monday  and given a shot of an antibiotic and then an oral drug.  On Tuesday,  she apparently sustained some trauma on her foot by her CD tower falling  on the same foot.  She also reports falling off a skateboard  approximately a month ago and stabbing her right great toe.  The pain is  constant and is located on the dorsum of the right foot near the medial  aspect.  Pain has no radiation.  It is aching and burning.  Pain was  8/10 at its worse and apparently 0/10 at the present time.  Condition is  aggravated by walking and exercise.  She does say that her skin has been  red and warm over the dorsum of her foot.  She has been taking the  Bactrim DS twice a day as instructed by her PCP, but the redness has  been spreading towards her ankle.  The patient will be admitted for  cellulitis after having failed outpatient oral antibiotic therapy.   PAST MEDICAL HISTORY:  1. Diabetes.  2. Diabetic neuropathy.  3. Diabetic retinopathy.  4. Hypertension.   PAST SURGICAL HISTORY:  1. C-section.  2. Laser retinal surgery.   SOCIAL HISTORY:  The patient is an occasional drinker, lives alone and  is single.  She is presently unemployed.  She lives on food stamps.  She  smokes 1 pack per day for 15 years.   FAMILY HISTORY:  Her maternal  grandmother had diabetes.   ALLERGIES:  No known drug allergies.   MEDICATIONS:  1. Lantus 20 units subcutaneous every night.  2. NovoLog sliding scale.  3. Bactrim DS 1 p.o. b.i.d.  4. Some form of BP medication/fluid till that she does not know the      name or dose.   REVIEW OF SYSTEMS:  Negative for fever, chills.  Negative for all 10  organ systems except for pertinent positives stated above.   PHYSICAL EXAMINATION:  Temperature 98.9, pulse 98, respiratory rate 18,  blood pressure 115/74, pulse oximetry 96% on room air.  HEENT:  Anicteric, EOMI, no nystagmus, pupils 1.5 mm, symmetric, direct,  consensual, near reflexes intact.  NECK:  No JVD, no bruit, no thyromegaly, no adenopathy.  HEART:  Regular rate and rhythm.  S1-S2.  No murmurs, gallops or rubs.  LUNGS:  Clear to auscultation bilaterally.  ABDOMEN:  Slightly obese, soft, nontender, nondistended.  Positive bowel  sounds.  EXTREMITIES:  No cyanosis, clubbing or edema.  EXTREMITIES:  No cyanosis or clubbing, slight edema of the right foot.  SKIN:  There is redness over the dorsum of the right foot and spreading  upwards towards the ankle.  It is slightly warm and tender to the touch.  There is no evidence of tinea pedis.  LYMPH NODES:  No adenopathy.  NEUROLOGIC EXAM:  Nonfocal, cranial nerves II-XII intact, reflexes 2+,  symmetric, diffuse with downgoing toes bilaterally, motor strength 5/5  in all 4 extremities, pinprick intact.  PSYCH:  Alert and oriented x3.   Sodium 132 (low), potassium 4.4, chloride 95, bicarb 26, BUN 21,  creatinine 1.15, AST 38 (high), ALT 30, alkaline phosphatase 117, total  bilirubin 0.3.  WBC 9.4, hemoglobin 12.5, platelet count 458.     Foot x-ray - impression:  Nondisplaced fracture of the proximal right  phalanx fifth toe.   ASSESSMENT AND PLAN:  1. Cellulitis:  Failed outpatient therapy with Bactrim.  The patient      was started on vancomycin IV, pharmacy to dose, and Zosyn 4.6 g  IV      q.8 h.  We will check a sed rate and a CRP.  Blood cultures x2      sets.  2. Diabetes.  The patient was placed on fingerstick blood sugars a.c.      and h.s., and we will use insulin sliding scale.  We will also      continue Lantus 20 units subcutaneous every night.  Check a      hemoglobin A1c.  The patient will be placed on ADA 2500 calories      diet.  3. Hypertension:  The patient will be started on Lasix 20 mg daily for      edema as well as blood pressure.  We will repeat a CMP in the      morning.  4. Abnormal liver function tests.  Check abdominal ultrasound.  The      patient can have further workup as an outpatient.  5. Deep vein thrombosis prophylaxis.  Lovenox 40 mg subcutaneous      daily.      Arlyss Repress, MD  Electronically Signed     JYK/MEDQ  D:  10/29/2008  T:  10/29/2008  Job:  [id number]   cc:   Jobe Igo, M.D.  [facility]

## 2010-08-15 NOTE — Discharge Summary (Signed)
NAMEDEONNI, [name]             ACCOUNT NO.:  [id number]   MEDICAL RECORD NO.:  [id number]          PATIENT TYPE:  INP   LOCATION:  1344                         FACILITY:  [facility]   PHYSICIAN:  Hind I Elsaid, MD      DATE OF BIRTH:  [birth year]   DATE OF ADMISSION:  10/29/2008  DATE OF DISCHARGE:  10/31/2008                               DISCHARGE SUMMARY   DISCHARGE DIAGNOSES:  1. Right foot cellulitis.  2. Uncontrolled type 1 diabetes mellitus with hemoglobin A1c of 13.  3. Diabetic neuropathy.  4. History of nondisplaced fracture of the proximal phalanx of the      right fifth toe.   DISCHARGE MEDICATIONS:  1. Clindamycin 300 mg p.o. b.i.d. for 10 days.  2. Flora-Q 1 tablet p.o. b.i.d.  3. Insulin Lantus 22 units subcu q.h.s. with NovoLog sliding scale.  4. Neurontin 300 mg p.o. daily.   PROCEDURES:  1. Tibial x-ray:  Negative for bone demineralization, no acute bone      abnormalities.  2. Right foot x-ray:  Nondisplaced fracture, proximal phalanx, right      fifth toe.   HOSPITAL COURSE:  This is a 40 year old female with history of diabetes  mellitus.  She was admitted after a CD tower had fallen on her foot.  She also reports falling off of a skateboard approximately one month ago  and she was told to have a fracture, nondisplaced on her right fifth  toe.  She came with pain, swelling and redness diagnosed as cellulitis.  The patient was placed on broad-spectrum antibiotics, namely Vancomycin  and Zosyn.  Blood culture was done which was negative.  The patient has  no elevated white blood cells.  Diabetes mellitus type 1 - hemoglobin A1c was found to be 13.2  The  patient admitted compliance with her medications.  Will continue with  insulin.  We will encourage insulin Lantus to 25 units subcu with  NovoLog sliding scale.  The patient was advised to follow up with her  primary care physician next week.      [name], MD  Electronically Signed     HIE/MEDQ  D:  10/31/2008  T:  10/31/2008  Job:  [id number]

## 2010-08-18 NOTE — Discharge Summary (Signed)
[name], [name]            ACCOUNT NO.:  [id number]   MEDICAL RECORD NO.:  [id number]          PATIENT TYPE:  INP   LOCATION:  1605                         FACILITY:  [facility]   PHYSICIAN:  Sheila Oats, M.D.DATE OF BIRTH:  06/14/1970   DATE OF ADMISSION:  05/25/2005  DATE OF DISCHARGE:  05/30/2005                                 DISCHARGE SUMMARY   DISCHARGE DIAGNOSES:  1.  Pyelonephritis with microabscesses in right kidney and sepsis syndrome:      Escherichia coli urinary tract infection.  2.  Uncontrolled diabetes mellitus.  3.  Iron-deficiency anemia, severe, secondary to menorrhagia.  4.  Probable left lower lobe pneumonia.  5.  Constipation, resolved.   STUDIES:  1.  CT scan of abdomen and pelvis: Marked abnormality of the right kidney,      perinephric stranding noted on the right with microabscesses.  2.  Chest x-ray: Mid left lower lobe atelectasis and possible pneumonia,      mild bronchitic changes noted as well.  3.  Abdominal films: Large amount of fecal matter within the colon.  Small      bowel gas pattern is normal.   HISTORY:  The patient is a 40 year old white female with past medical  significant for type 1 diabetes who presented to the ER following 5 days of  weakness, nausea, vomiting, fevers, and chills.  She reported has diabetes  that has not been well controlled, and she had many ups and downs.  Starting  on the Monday prior to admission, she began having generalized malaise,  fatigue, and over the next several days prior to admission, she became  nauseated with vomiting and abdominal pain.  She presented to the ER on the  day of admission.  During that evaluation, she was found to have temperature  of 102.4, blood glucose of 350.  White cell count 18.1 and hemoglobin 8.3,  with MCV of 62.  Urinalysis was consistent with a urinary tract infection,  and she had a CT scan of her abdomen which showed some areas within her  right kidney believed  to be microabscesses.  She also complained of  polydipsia, mild headache.  She denied chest pain, palpitations, shortness  of breath, and also denied coughing.  She stated that the pain she had in  her abdomen felt like it might be radiating from her back.   Physical exam upon admission as per Dr. Maryland Pink revealed a temperature of  101.9 and initially was 102.4.  Her pulse was 97, blood pressure 118/70,  respiratory rate 20, O2 saturation 100%.  In general, she was alert and  oriented, in some mild distress secondary to being weak.  Other pertinent  findings on her exam, on HEENT she had dry mucous membranes.  On her cardiac  exam, she was noted to have some ectopic beats.   On the laboratory data, her sodium was 130 with a potassium of 4.2, chloride  90, CO2 of 32, BUN 21, creatinine 0.1, glucose 347.  LFTs were unremarkable.  Lipase was normal at 19.  White cell count was 18.  Hemoglobin was 8.3  with  a hematocrit of 26.4, platelet count 988, and neutrophil count of 90%.  Her  urinalysis was cloudy with a specific gravity of 1.024, nitrite negative,  leukocyte esterase large, urine wbc's 21 to 50, bacteria few.  Her MCV was  at 63.   HOSPITAL COURSE:  #1.  PYELONEPHRITIS WITH MICROABSCESSES AND SEPSIS SYNDROME: Upon admission,  the patient had blood and urine cultures done and was empirically started on  broad-spectrum antibiotics.  The CT of her abdomen and pelvis was reviewed  with the radiologist, and the findings were that she had right perinephric  stranding with multiple small microabscesses, and these were too small to be  drained.  The blood cultures did not grow any bacteria. The urine cultures  grew greater than 100,000 CFU of E. coli.  The patient was maintained on IV  antibiotics during her hospital stay.  She responded well to this  intervention, and leukocytosis resolved.  Her last white cell count on the  day prior to discharge is 10.8.  She has also remained afebrile  and  hemodynamically stable.  She is tolerating p.o. well with no nausea or  vomiting and also no abdominal pain.  The patient will be discharged on oral  antibiotics for 17 more days and is to follow up at [facility].   #2.  UNCONTROLLED DIABETES MELLITUS: The patient was aggressively hydrated.  Accu-Chek's were monitored, and she was started on Lantus and also covered  with sliding scale insulin.  Her hemoglobin A1c on admission was noted to be  12.9. The patient was set up for outpatient diabetes teaching.  Her sugars  were much better controlled during her hospital stay on a regimen of 15  units of Lantus nightly.  She is to follow up at [facility].   #3.  SEVERE IRON-DEFICIENCY ANEMIA, LIKELY SECONDARY TO MENORRHAGIA:  The  patient was noted to have a microcytic anemia upon admission, and she gives  a history of menorrhagia.  Her hemoglobin dropped to 6.5 on the third  hospital day.  She was transfused 2 units of packed red blood cells, and her  hemoglobin improved to 8.9.  She has had no events of GI bleeding and has  remained hemodynamically stable.  Her last hemoglobin and hematocrit prior  to discharge today is 9.2 and 28.9, respectively.  The patient was also  started on iron supplementation while in the hospital.  She is to follow up  at [facility].   #4.  PROBABLE LEFT LOWER LOBE PNEUMONIA: The patient reported a cough, and  her chest x-ray is as above, left lower lobe atelectasis with possible  pneumonia.  The patient was started on antitussives and also on antibiotics  to cover for pneumonia.  Symptoms have resolved, and she is to continue  antibiotics upon discharge.   #5.  CONSTIPATION: The patient complained of some abdominal distention while  in the hospital, and abdominal films were done and showed increased amount  of stool with normal small-bowel gas pattern.  The patient was given soap  suds enema in the hospital and had good results with that.  On physical  exam prior to discharge, her abdomen was soft with bowel sounds present and  nontender.   DISCHARGE MEDICATIONS:  1.  Levaquin 500 mg 1 p.o. daily x 17 more days.  2.  Nu-Iron 150 mg 1 p.o. twice daily.  3.  Folic acid 1 mg p.o. daily.  4.  Lantus 15 units subcutaneously nightly   FOLLOW UP:  [facility] on June 01, 2005.   CONDITION ON DISCHARGE:  Improved, stable.      Sheila Oats, M.D.  Electronically Signed     ACV/MEDQ  D:  05/30/2005  T:  05/30/2005  Job:  [id number]   cc:   Karoline Caldwell

## 2010-08-18 NOTE — Op Note (Signed)
[name], [name]               ACCOUNT NO.:  [id number]   MEDICAL RECORD NO.:  [id number]          PATIENT TYPE:  MAT   LOCATION:  [facility]                          FACILITY:  [facility]   PHYSICIAN:  Eldred Manges, M.D.DATE OF BIRTH:  [birth year]   DATE OF PROCEDURE:  05/02/2004  DATE OF DISCHARGE:                                 OPERATIVE REPORT   PREOPERATIVE DIAGNOSIS:  Incomplete spontaneous abortion.   POSTOPERATIVE DIAGNOSIS:  Incomplete spontaneous abortion.   OPERATION:  Suction dilatation and evacuation.   ANESTHESIA:  Monitored anesthesia care, local.   ESTIMATED BLOOD LOSS:  Less than 50 mL.   COMPLICATIONS:  None.   FINDINGS:  A moderate amount of products of conception were obtained at the  time of D&E.   PROCEDURE:  The patient was taken to the operating room after appropriate  identification and placed on the operating table.  After placement of  equipment with monitored anesthesia care, she was placed in the lithotomy  position.  The perineum and vagina were prepped with multiple layers of  Betadine and a red Robinson catheter used to empty the bladder.  The  perineum was draped as a sterile field.  A Graves speculum was placed in the  vagina and a single-tooth tenaculum placed on the anterior cervix.  A  paracervical block was achieved with a total of 10 mL of 0.25%% Marcaine in  the 5 and 7 o'clock positions.  The cervix was already dilated enough to  accommodate a #8 suction curette, and this was used to suction-evacuate the  entire uterus.  A sharp curette was used to ensure that all products of  conception had been removed.  All instruments were removed from the vagina  and a silver nitrate swab was used to achieve hemostasis at the tenaculum  sites.  The patient was taken to the recovery room in satisfactory condition  having tolerated procedure well, with sponge and instruments count correct.   FOLLOW-UP INSTRUCTIONS:  The patient is to call for a two-week  postoperative  appointment at [place].  She is to receive RhoGAM in the  postanesthesia care unit.  She is given printed instructions from the  [facility] for suction dilatation and evacuation.      VPH/MEDQ  D:  05/02/2004  T:  05/02/2004  Job:  [id number]

## 2010-08-18 NOTE — H&P (Signed)
[name], [name] NO.:  [id number]   MEDICAL RECORD NO.:  [id number]          PATIENT TYPE:  INP   LOCATION:  0102                         FACILITY:  [facility]   PHYSICIAN:  Annita Brod, M.D.DATE OF BIRTH:  Aug 06, 1970   DATE OF ADMISSION:  05/25/2005  DATE OF DISCHARGE:                                HISTORY & PHYSICAL   ATTENDING PHYSICIAN:  Jerelene Redden, M.D.   The patient has no PCP.   CHIEF COMPLAINT:  Abdominal pain.   HISTORY OF PRESENT ILLNESS:  The patient is a 40 year old white female with  past medical history of diabetes mellitus type 1 who presents to the  emergency room after having five days of weakness, nausea, vomiting, and  fevers, chills.  She has previously been in good health.  She tells me that  her diabetes is not very well controlled, and she has many ups and downs and  then starting Monday prior to admission she started having problems with  just not feeling well, feeling very fatigued and over the next several days  it progressively worse nausea, vomiting, and abdominal pain.  She presented  to the emergency room on the evening day of admission and during evaluation  was noted to have a temp as high as 102.4, a CBG of 350, a white count of  18.1, and a slightly low hemoglobin at 8.3 with a MCV of 62.  Urinalysis  showed a significant UTI.  The patient underwent a CT of the abdomen and  pelvis which showed an area within her right kidney believed to be an  abscess.  Currently, the patient states that she is feeling quite weak,  although a little bit better than when she first came in.  She does complain  of just being very thirsty.  She has a mild headache.  She denies any vision  changes or dysphagia.  No chest pain, palpitations, shortness of breath, or  wheezing.  She has had a cough which started about after she has come in,  but she says prior to coming in she has had no problems with any type of  cough.  She denied any  productive cough.  She does complain of some  abdominal pain but feels like it may be radiating from her back.  She denies  any constipation or diarrhea.  She does not note any hematuria or dysuria.  No focal extremity numbness, weakness, or pain.   REVIEW OF SYSTEMS:  Otherwise negative.   PAST MEDICAL HISTORY:  Diabetes mellitus for about 30 years, not very well  controlled.   MEDICATIONS:  Insulin 70/30, 10 and 3 in the morning, 15 and 5 in the  evening.   ALLERGIES:  She has no known drug allergies.   SOCIAL HISTORY:  She admits to smoking but gave that up years ago.  She  denies any drug or alcohol use.   FAMILY HISTORY:  Noncontributory.   PHYSICAL EXAMINATION:  VITAL SIGNS:  On admission, temp 101.9, as high as  102.4, heart rate 97, blood pressure 118/70, respirations 20, O2 sat 100% on  room air.  GENERAL:  The patient is alert and oriented x3 in some mild distress  secondary to being weak.  HEENT:  Normocephalic/atraumatic.  Her mucous membranes are dry.  She has no  carotid bruits.  HEART:  Irregularly regular rhythm with an ectopic beat, a questioned S3 or  S4 murmur.  LUNGS:  Clear to auscultation bilaterally.  ABDOMEN:  Soft, mild tenderness, hypoactive bowel sounds.  No flank pain.  EXTREMITIES:  Showed no clubbing, cyanosis, or edema.  Poor peripheral  pulses.   LABORATORY:  Sodium 130, potassium 4.2, chloride 90, bicarb 32, BUN 21,  creatinine 0.9, glucose 347.  LFTs are unremarkable.  The patient has been  on insulin Glucomander since ER and her last sugar was down in the 130s.  Lipase level is normal at 19.  White count 18.1, H&H __________  26.4, MCV  of 63, platelet count 988.  Urine pregnancy test is negative.  Coags are  normal.  Blood cultures are drawn and pending.  She has a 90% shift and 21-  50 white cells.   ASSESSMENT/PLAN:  1.  Urinary tract infection with possible renal abscess and suspected      sepsis.  We will start the patient on  intravenous fluids, intravenous      Cipro, await blood cultures.  I have asked interventional radiology for      a consult for possible drainage of a renal abscess.  2.  Hyperglycemia.  The patient is not in diabetic ketoacidosis and already      her sugars are down more controlled.  I will start the patient on a      insulin sliding scale as well as normal insulin when she is able to      tolerate oral intake.  3.  Microcytic anemia.  Suspect this may be either chronic disease or iron-      deficiency.  We will check iron studies.  I will hold on transfusion      unless the patient's hemoglobin falls.  She has had no episodes of      rectal bleeding.  4.  Questionable arrhythmia.  In discussion, the patient tells me that she      had notice of a murmur during her pregnancy but at no other time.  She      has either an ectopic beat, an S4 gallop.  We will check an EKG and a 2D      echo to be thorough and rule out the possibility of endocarditis.      Annita Brod, M.D.  Electronically Signed     SKK/MEDQ  D:  05/25/2005  T:  05/25/2005  Job:  [id number]

## 2010-11-15 ENCOUNTER — Inpatient Hospital Stay (HOSPITAL_COMMUNITY)
Admission: EM | Admit: 2010-11-15 | Discharge: 2010-11-17 | DRG: 638 | Disposition: A | Discharge status: Home Health | Payer: Medicaid Other | Attending: Internal Medicine | Admitting: Internal Medicine

## 2010-11-15 DIAGNOSIS — E1039 Type 1 diabetes mellitus with other diabetic ophthalmic complication: Secondary | ICD-10-CM | POA: Diagnosis present

## 2010-11-15 DIAGNOSIS — Z794 Long term (current) use of insulin: Secondary | ICD-10-CM

## 2010-11-15 DIAGNOSIS — E1049 Type 1 diabetes mellitus with other diabetic neurological complication: Secondary | ICD-10-CM | POA: Diagnosis present

## 2010-11-15 DIAGNOSIS — E1142 Type 2 diabetes mellitus with diabetic polyneuropathy: Secondary | ICD-10-CM | POA: Diagnosis present

## 2010-11-15 DIAGNOSIS — E1069 Type 1 diabetes mellitus with other specified complication: Principal | ICD-10-CM | POA: Diagnosis present

## 2010-11-15 DIAGNOSIS — L03039 Cellulitis of unspecified toe: Secondary | ICD-10-CM | POA: Diagnosis present

## 2010-11-15 DIAGNOSIS — E871 Hypo-osmolality and hyponatremia: Secondary | ICD-10-CM | POA: Diagnosis present

## 2010-11-15 DIAGNOSIS — L02619 Cutaneous abscess of unspecified foot: Secondary | ICD-10-CM | POA: Diagnosis present

## 2010-11-15 DIAGNOSIS — Z8614 Personal history of Methicillin resistant Staphylococcus aureus infection: Secondary | ICD-10-CM

## 2010-11-15 DIAGNOSIS — E11319 Type 2 diabetes mellitus with unspecified diabetic retinopathy without macular edema: Secondary | ICD-10-CM | POA: Diagnosis present

## 2010-11-15 DIAGNOSIS — L97509 Non-pressure chronic ulcer of other part of unspecified foot with unspecified severity: Secondary | ICD-10-CM | POA: Diagnosis present

## 2010-11-15 DIAGNOSIS — N179 Acute kidney failure, unspecified: Secondary | ICD-10-CM | POA: Diagnosis present

## 2010-11-15 DIAGNOSIS — D638 Anemia in other chronic diseases classified elsewhere: Secondary | ICD-10-CM | POA: Diagnosis present

## 2010-11-15 DIAGNOSIS — I10 Essential (primary) hypertension: Secondary | ICD-10-CM | POA: Diagnosis present

## 2010-11-16 ENCOUNTER — Emergency Department (HOSPITAL_COMMUNITY): Payer: Medicaid Other

## 2010-11-16 LAB — BASIC METABOLIC PANEL
BUN: 23 mg/dL (ref 6–23)
Chloride: 100 mEq/L (ref 96–112)
Creatinine, Ser: 0.96 mg/dL (ref 0.50–1.10)
GFR calc Af Amer: >60 mL/min (ref >60)

## 2010-11-16 LAB — DIFFERENTIAL
Eosinophils Relative: 2 % (ref 0–5)
Lymphocytes Relative: 22 % (ref 12–46)
Monocytes Relative: 6 % (ref 3–12)
Neutrophils Relative %: 70 % (ref 43–77)

## 2010-11-16 LAB — GLUCOSE, CAPILLARY
Glucose-Capillary: 110 mg/dL — ABNORMAL HIGH (ref 70–99)
Glucose-Capillary: 88 mg/dL (ref 70–99)

## 2010-11-16 LAB — LIPID PANEL
HDL: 31 mg/dL — ABNORMAL LOW (ref >39)
LDL Cholesterol: 105 mg/dL — ABNORMAL HIGH (ref 0–99)
Triglycerides: 218 mg/dL — ABNORMAL HIGH (ref <150)
VLDL: 44 mg/dL — ABNORMAL HIGH (ref 0–40)

## 2010-11-16 LAB — CBC
HCT: 28.7 % — ABNORMAL LOW (ref 36.0–46.0)
Hemoglobin: 9.1 g/dL — ABNORMAL LOW (ref 12.0–15.0)
RBC: 4.21 MIL/uL (ref 3.87–5.11)
RDW: 15.2 % (ref 11.5–15.5)
WBC: 8.5 10*3/uL (ref 4.0–10.5)

## 2010-11-16 LAB — IRON AND TIBC: TIBC: 363 ug/dL (ref 250–470)

## 2010-11-16 LAB — POCT I-STAT, CHEM 8
BUN: 28 mg/dL — ABNORMAL HIGH (ref 6–23)
Chloride: 95 mEq/L — ABNORMAL LOW (ref 96–112)
HCT: 32 % — ABNORMAL LOW (ref 36.0–46.0)
Potassium: 4.7 mEq/L (ref 3.5–5.1)

## 2010-11-16 LAB — VITAMIN B12: Vitamin B-12: 510 pg/mL (ref 211–911)

## 2010-11-16 LAB — RETICULOCYTES
RBC.: 3.73 MIL/uL — ABNORMAL LOW (ref 3.87–5.11)
Retic Count, Absolute: 63.4 10*3/uL (ref 19.0–186.0)
Retic Ct Pct: 1.7 % (ref 0.4–3.1)

## 2010-11-16 LAB — FERRITIN: Ferritin: 17 ng/mL (ref 10–291)

## 2010-11-16 LAB — TSH: TSH: 6.534 u[IU]/mL — ABNORMAL HIGH (ref 0.350–4.500)

## 2010-11-19 LAB — WOUND CULTURE
Culture: NO GROWTH
Gram Stain: NONE SEEN

## 2010-11-22 LAB — CULTURE, BLOOD (ROUTINE X 2): Culture  Setup Time: 201208161043

## 2011-02-19 ENCOUNTER — Other Ambulatory Visit (HOSPITAL_COMMUNITY)
Admission: RE | Admit: 2011-02-19 | Discharge: 2011-02-19 | Disposition: A | Discharge status: Home / Self Care | Payer: Medicaid Other | Source: Ambulatory Visit | Attending: Family Medicine | Admitting: Family Medicine

## 2011-02-19 ENCOUNTER — Other Ambulatory Visit: Payer: Self-pay | Admitting: Family Medicine

## 2011-02-19 DIAGNOSIS — Z01419 Encounter for gynecological examination (general) (routine) without abnormal findings: Secondary | ICD-10-CM | POA: Insufficient documentation

## 2011-03-29 IMAGING — MR MR [PERSON_NAME] UP JT W/O CM*R*
4 series · 19 of 40 positions shown · non-contrast
Comparison: Right shoulder radiographs 02/17/2009.

CLINICAL DATA: Right shoulder pain. Limited range of motion.

MRI RIGHT SHOULDER WITHOUT CONTRAST
TECHNIQUE: Multiplanar, multisequence MR imaging of the right
shoulder was performed.  No intravenous contrast was administered.

[Series 2: T2 fat-sat · axial · 4.0mm · 0.23mm/px · z∈[-87,-2]mm · 10 of 20 slices shown (1 of 3)]
[im 1/20]
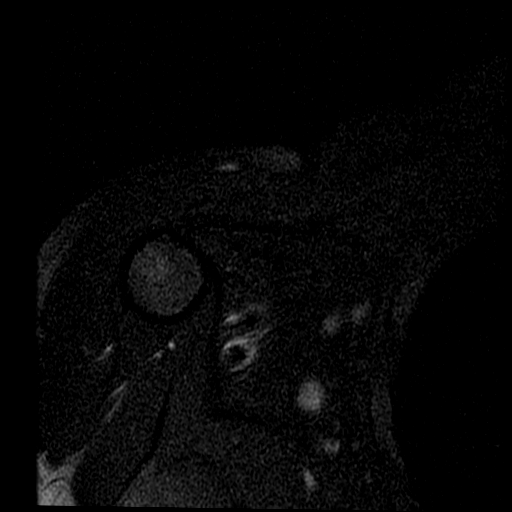
[im 2/20]
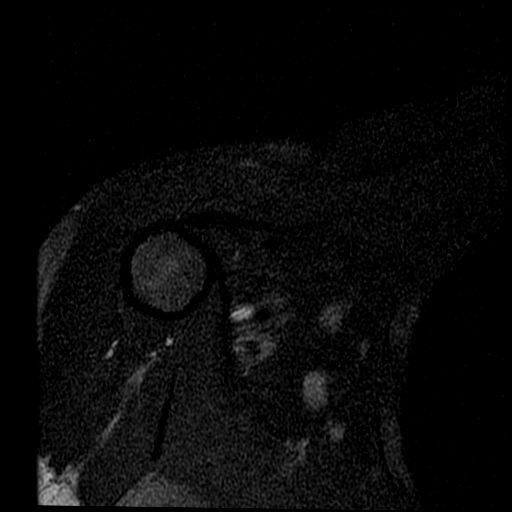
[im 4/20]
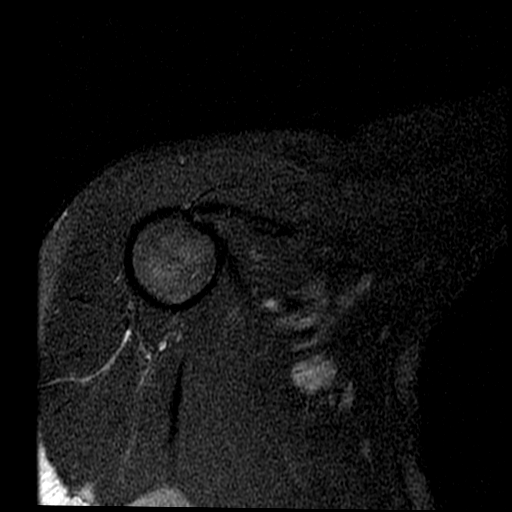
[im 6/20]
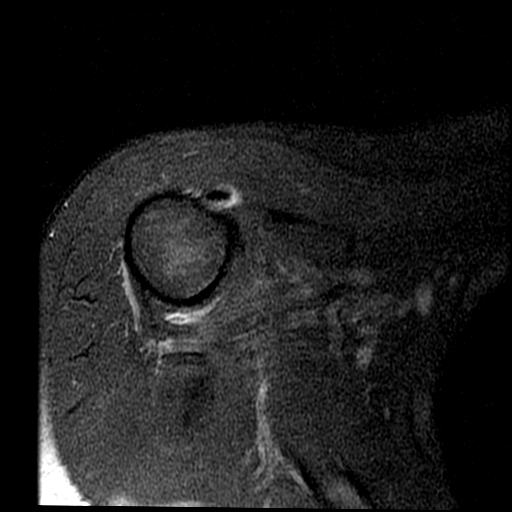
[im 8/20]
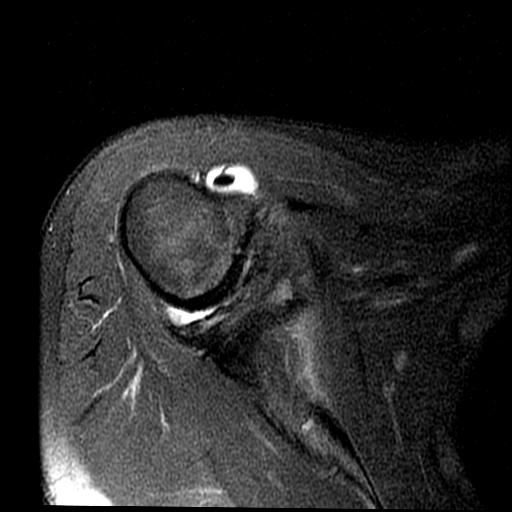
[im 10/20]
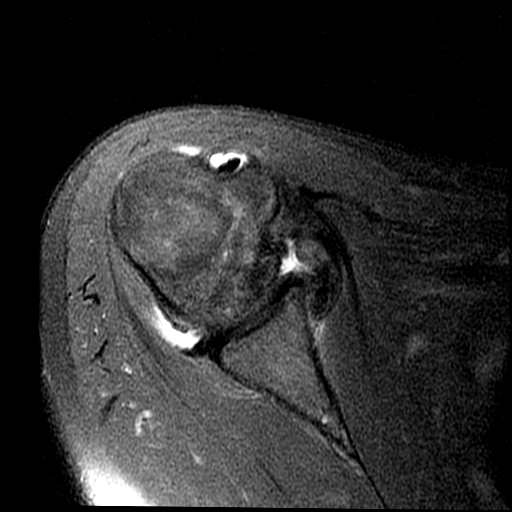
[im 12/20]
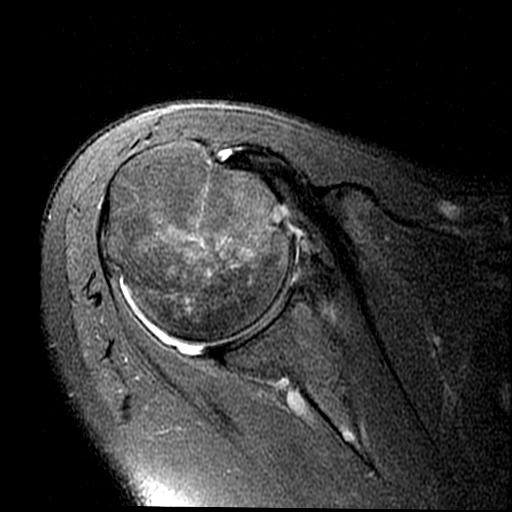
[im 14/20]
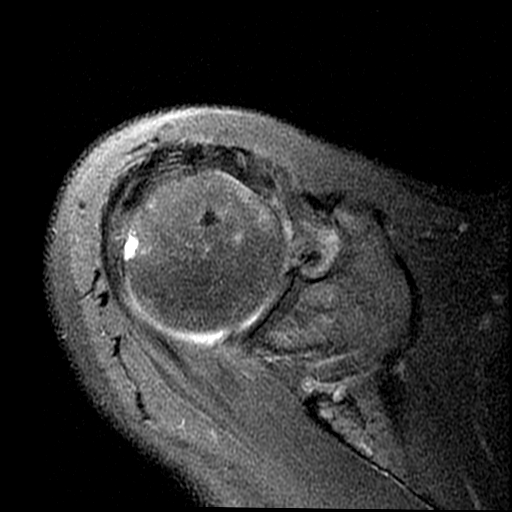
[im 16/20]
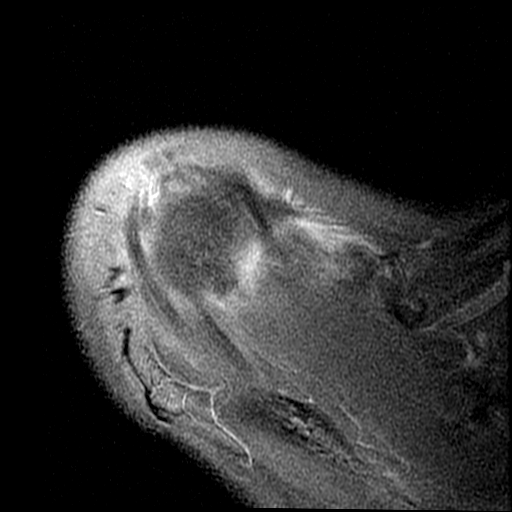
[im 18/20]
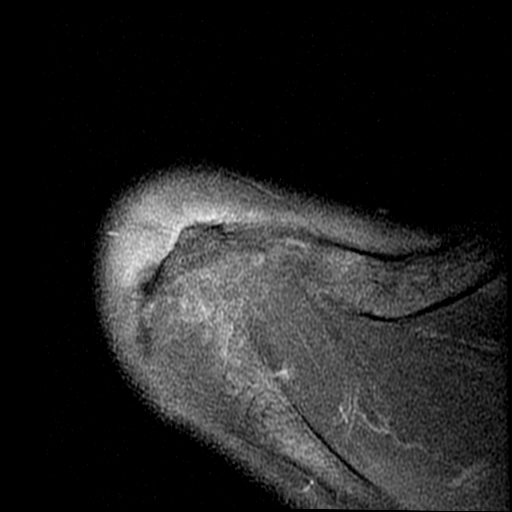

[Series 3: T2 fat-sat · oblique · 4.0mm · 0.27mm/px · 3 of 17 slices shown (2 of 3)]
[im 3/17]
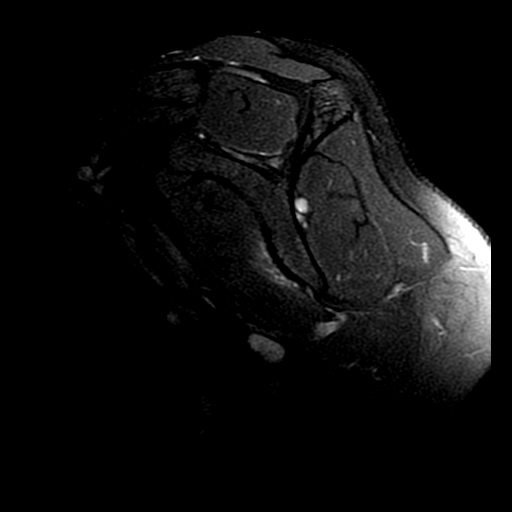
[im 9/17]
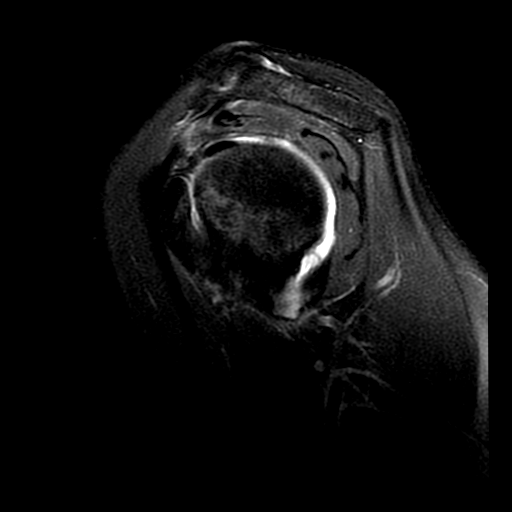
[im 15/17]
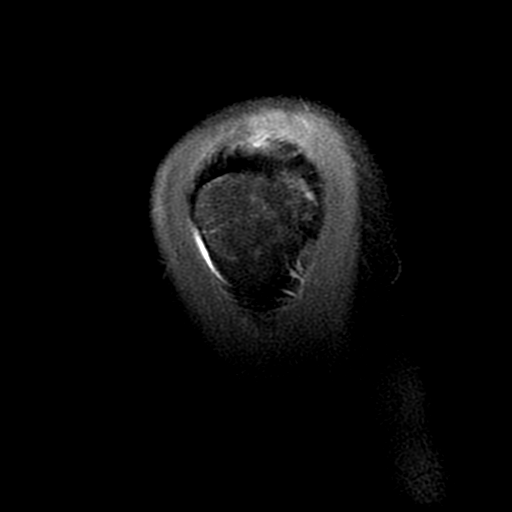

[Series 4: T1 · oblique · 4.0mm · 0.27mm/px · 3 of 18 slices shown]
[im 2/18]
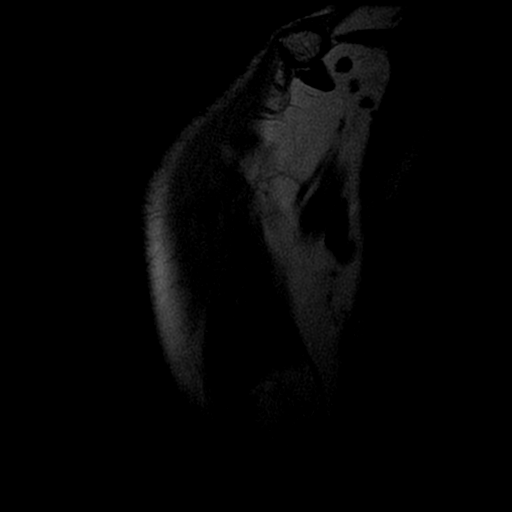
[im 10/18]
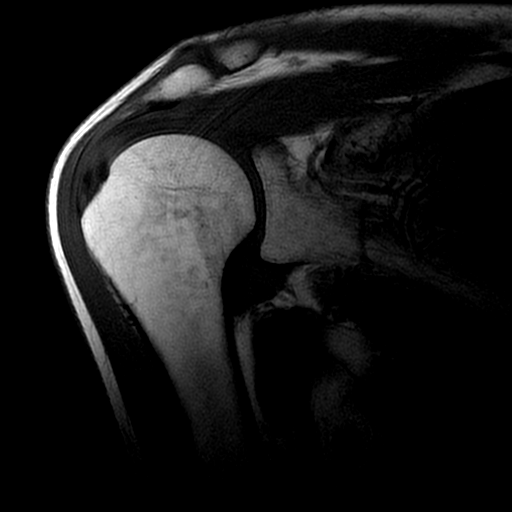
[im 16/18]
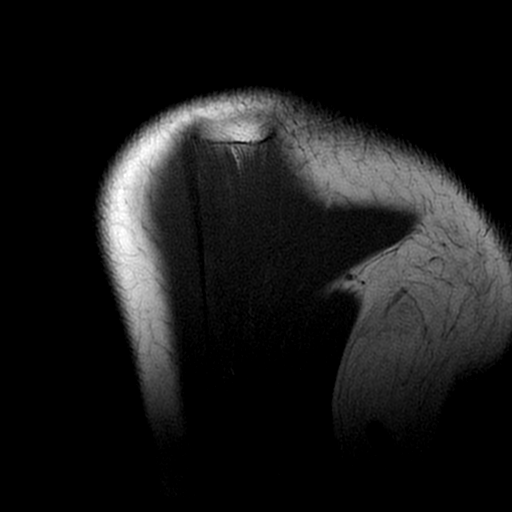

[Series 5: T2 fat-sat · oblique · 4.0mm · 0.27mm/px · 3 of 18 slices shown (3 of 3)]
[im 2/18]
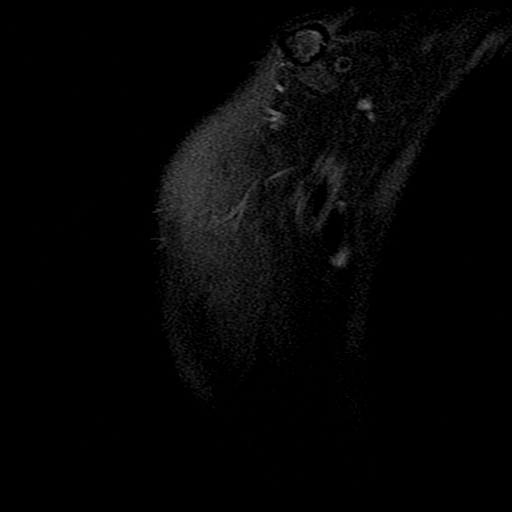
[im 10/18]
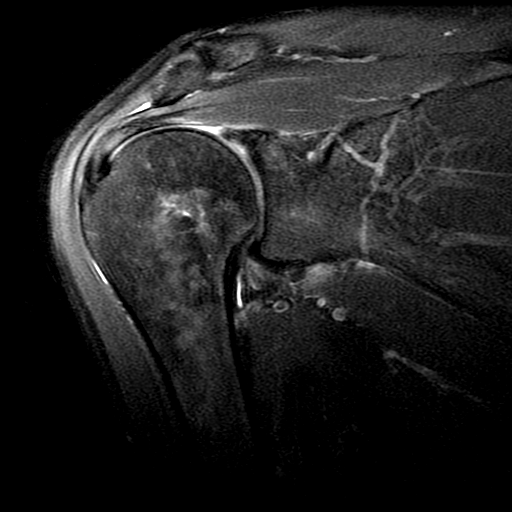
[im 16/18]
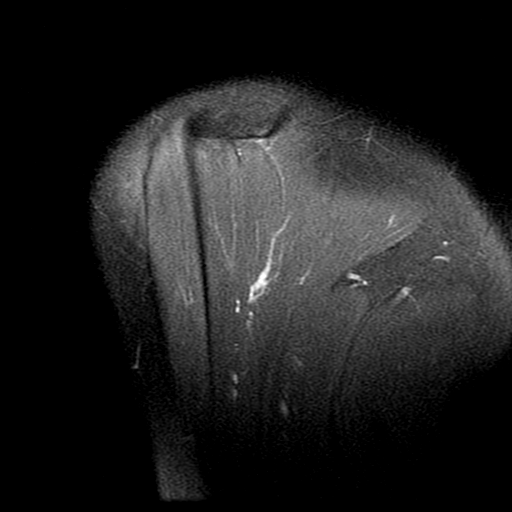

[19 of 40 positions shown; findings below may reference images not displayed]

FINDINGS: No acute bony findings.  There are mild AC joint
degenerative changes.  No lateral downsloping or undersurface
spurring change of the type 1 acromion.

Mild rotator cuff tendinopathy/tendinosis with areas of bursal and
articular surface irregularity but no discrete partial or full-
thickness rotator cuff tear.

The long head biceps tendon is intact.  The glenoid labra are
grossly normal.

 There is thickening of the capsular structures in the axillary
recess which can be seen with adhesive capsulitis or synovitis.

No subacromial/subdeltoid bursitis.
IMPRESSION: 1.  No significant MR findings for bony impingement.
2.  Mild rotator cuff tendinopathy/tendinosis but no partial or
full-thickness rotator cuff tear.
3.  Intact biceps tendon and grossly normal glenoid labra.
4.  There is thickening of the capsular structures in the axillary
recess which can be seen with adhesive capsulitis or synovitis.

## 2011-04-19 ENCOUNTER — Encounter (HOSPITAL_COMMUNITY): Payer: Self-pay | Admitting: Emergency Medicine

## 2011-04-19 ENCOUNTER — Emergency Department (HOSPITAL_COMMUNITY)
Admission: EM | Admit: 2011-04-19 | Discharge: 2011-04-19 | Disposition: A | Discharge status: Home / Self Care | Payer: Medicaid Other | Source: Home / Self Care | Attending: Emergency Medicine | Admitting: Emergency Medicine

## 2011-04-19 ENCOUNTER — Emergency Department (INDEPENDENT_AMBULATORY_CARE_PROVIDER_SITE_OTHER): Payer: Medicaid Other

## 2011-04-19 DIAGNOSIS — E1169 Type 2 diabetes mellitus with other specified complication: Secondary | ICD-10-CM

## 2011-04-19 DIAGNOSIS — E11621 Type 2 diabetes mellitus with foot ulcer: Secondary | ICD-10-CM

## 2011-04-19 DIAGNOSIS — L97509 Non-pressure chronic ulcer of other part of unspecified foot with unspecified severity: Secondary | ICD-10-CM

## 2011-04-19 MED ORDER — CIPROFLOXACIN HCL 750 MG PO TABS: 750.0000 mg | ORAL_TABLET | Freq: Two times a day (BID) | ORAL | Status: AC

## 2011-04-19 MED ORDER — SULFAMETHOXAZOLE-TRIMETHOPRIM 800-160 MG PO TABS: ORAL_TABLET | ORAL | Status: DC

## 2011-04-19 NOTE — ED Provider Notes (Signed)
History     CSN: [id number]  Arrival date & time 04/19/11  1039   First MD Initiated Contact with Patient 04/19/11 1315      Chief Complaint  Patient presents with  . Foot Injury    (Consider location/radiation/quality/duration/timing/severity/associated sxs/prior treatment) HPI Comments: Patient with one month of painless foot ulcer on the bottom of her left foot over the past month . She states that this started after pulling off a piece of skin. No other trauma to her foot. No nausea, vomiting, fevers, drainage. Some localized swelling and redness, but no redness streaking up her foot. Has been putting an unknown antibiotic cream on it, and took 2 weeks of an unknown antibiotic without resolution. Last dose of this antibiotic was one week ago. Patient states that her glucose has been within her usual range during this time. Per chart review, patient was admitted to [facility] in August 2012 for diabetic foot ulcer. She was sent home with doxycycline and Cipro.  ROS as noted in HPI. All other ROS negative.   Patient is a 41 y.o. female presenting with foot injury. The history is provided by the patient.  Foot Injury     Past Medical History  Diagnosis Date  . Diabetes mellitus     History reviewed. No pertinent past surgical history.  History reviewed. No pertinent family history.  History  Substance Use Topics  . Smoking status: Never Smoker   . Smokeless tobacco: Not on file  . Alcohol Use: Yes     Occasional    OB History    Grav Para Term Preterm Abortions TAB SAB Ect Mult Living                  Review of Systems  Allergies  Review of patient's allergies indicates no known allergies.  Home Medications   Current Outpatient Rx  Name Route Sig Dispense Refill  . FERROUS FUMARATE 325 (106 FE) MG PO TABS Oral Take 1 tablet by mouth.    . INSULIN GLARGINE 100 UNIT/ML [place] SOLN Subcutaneous Inject into the skin at bedtime.    . INSULIN LISPRO (HUMAN) 100  UNIT/ML [place] SOLN Subcutaneous Inject into the skin 3 (three) times daily before meals.    Marland Kitchen CIPROFLOXACIN HCL 750 MG PO TABS Oral Take 1 tablet (750 mg total) by mouth 2 (two) times daily. 28 tablet 0  . SULFAMETHOXAZOLE-TRIMETHOPRIM 800-160 MG PO TABS  2 tabs po bid x 14 days 56 tablet 0    BP 150/85  Pulse 82  Temp 98.2 F (36.8 C)  SpO2 100%  LMP 04/08/2011  Physical Exam  Nursing note and vitals reviewed. Constitutional: She is oriented to person, place, and time. She appears well-developed and well-nourished. No distress.  HENT:  Head: Normocephalic and atraumatic.  Eyes: Conjunctivae and EOM are normal.  Neck: Normal range of motion.  Cardiovascular: Regular rhythm.   Pulmonary/Chest: Effort normal and breath sounds normal.  Abdominal: She exhibits no distension.  Musculoskeletal: Normal range of motion.       1 cm ulcer on plantar aspect of foot at first MTP. No tenderness, no expressible discharge. Mild surrounding erythema, and warmth. No erythema streaking up the foot, no lymphangitis. Decreased sensation to middle of foot which patient states is baseline for her. Able to wiggle  e toes actively.  Neurological: She is alert and oriented to person, place, and time.  Skin: Skin is warm and dry.  Psychiatric: She has a normal mood and affect.  Her behavior is normal. Judgment and thought content normal.    ED Course  Procedures (including critical care time)  Labs Reviewed - No data to display Dg Foot Complete Left  04/19/2011  *RADIOLOGY REPORT*  Clinical Data: Diabetic foot ulcer along plantar aspect of foot near first metatarsal.  LEFT FOOT - COMPLETE 3+ VIEW  Comparison: The  Findings: There is no evidence of bony destruction, fracture, soft tissue foreign body or soft tissue gas.  Bones are osteopenic.  No significant arthropathy identified.  No focal bony lesions.  IMPRESSION: No acute abnormalities.  No radiographic evidence of osteomyelitis.  Original Report  Authenticated By: Azzie Roup, M.D.     1. Diabetic foot ulcer       MDM  Previous records reviewed, as noted in in history of present illness. Next is next is will check foot x-ray to rule out osteomyelitis. No evidence of systemic involvement. Will attempt outpatient treatment with Bactrim DS 2 tabs twice a day plus Cipro 750 by mouth twice a day. Will refer to podiatry. Will have patient to return ED for any worsening symptoms, or other concerns. Discussed this with the patient who agrees with plan.  X-ray reviewed by myself. Report per radiologist  Cherly Beach, MD 04/19/11 1444

## 2011-04-19 NOTE — ED Notes (Signed)
Pt has wound to left foot that started 1 month ago but has gotten slowly worse. She has minimal pain, "like a cold tingle up the nerves" but she states she doesn't have much feeling in her legs. She has type 1 diabetes.

## 2011-05-09 ENCOUNTER — Inpatient Hospital Stay (HOSPITAL_BASED_OUTPATIENT_CLINIC_OR_DEPARTMENT_OTHER)
Admission: EM | Admit: 2011-05-09 | Discharge: 2011-05-14 | DRG: 638 | Disposition: A | Discharge status: Home / Self Care | Payer: Medicaid Other | Source: Ambulatory Visit | Attending: Internal Medicine | Admitting: Internal Medicine

## 2011-05-09 ENCOUNTER — Emergency Department (INDEPENDENT_AMBULATORY_CARE_PROVIDER_SITE_OTHER): Payer: Medicaid Other

## 2011-05-09 ENCOUNTER — Encounter (HOSPITAL_BASED_OUTPATIENT_CLINIC_OR_DEPARTMENT_OTHER): Payer: Self-pay | Admitting: Emergency Medicine

## 2011-05-09 DIAGNOSIS — E1029 Type 1 diabetes mellitus with other diabetic kidney complication: Secondary | ICD-10-CM | POA: Diagnosis present

## 2011-05-09 DIAGNOSIS — E11621 Type 2 diabetes mellitus with foot ulcer: Secondary | ICD-10-CM

## 2011-05-09 DIAGNOSIS — E7800 Pure hypercholesterolemia, unspecified: Secondary | ICD-10-CM

## 2011-05-09 DIAGNOSIS — M9080 Osteopathy in diseases classified elsewhere, unspecified site: Secondary | ICD-10-CM | POA: Diagnosis present

## 2011-05-09 DIAGNOSIS — L0291 Cutaneous abscess, unspecified: Secondary | ICD-10-CM | POA: Diagnosis present

## 2011-05-09 DIAGNOSIS — Z794 Long term (current) use of insulin: Secondary | ICD-10-CM

## 2011-05-09 DIAGNOSIS — D509 Iron deficiency anemia, unspecified: Secondary | ICD-10-CM

## 2011-05-09 DIAGNOSIS — I70209 Unspecified atherosclerosis of native arteries of extremities, unspecified extremity: Secondary | ICD-10-CM

## 2011-05-09 DIAGNOSIS — E1049 Type 1 diabetes mellitus with other diabetic neurological complication: Secondary | ICD-10-CM | POA: Diagnosis present

## 2011-05-09 DIAGNOSIS — E1039 Type 1 diabetes mellitus with other diabetic ophthalmic complication: Secondary | ICD-10-CM | POA: Diagnosis present

## 2011-05-09 DIAGNOSIS — E1140 Type 2 diabetes mellitus with diabetic neuropathy, unspecified: Secondary | ICD-10-CM

## 2011-05-09 DIAGNOSIS — L97509 Non-pressure chronic ulcer of other part of unspecified foot with unspecified severity: Secondary | ICD-10-CM

## 2011-05-09 DIAGNOSIS — Z9189 Other specified personal risk factors, not elsewhere classified: Secondary | ICD-10-CM

## 2011-05-09 DIAGNOSIS — E1142 Type 2 diabetes mellitus with diabetic polyneuropathy: Secondary | ICD-10-CM | POA: Diagnosis present

## 2011-05-09 DIAGNOSIS — M869 Osteomyelitis, unspecified: Secondary | ICD-10-CM | POA: Diagnosis present

## 2011-05-09 DIAGNOSIS — L02619 Cutaneous abscess of unspecified foot: Secondary | ICD-10-CM

## 2011-05-09 DIAGNOSIS — D649 Anemia, unspecified: Secondary | ICD-10-CM

## 2011-05-09 DIAGNOSIS — D473 Essential (hemorrhagic) thrombocythemia: Secondary | ICD-10-CM | POA: Diagnosis present

## 2011-05-09 DIAGNOSIS — L98499 Non-pressure chronic ulcer of skin of other sites with unspecified severity: Secondary | ICD-10-CM | POA: Diagnosis present

## 2011-05-09 DIAGNOSIS — N058 Unspecified nephritic syndrome with other morphologic changes: Secondary | ICD-10-CM | POA: Diagnosis present

## 2011-05-09 DIAGNOSIS — M899 Disorder of bone, unspecified: Secondary | ICD-10-CM

## 2011-05-09 DIAGNOSIS — Z79899 Other long term (current) drug therapy: Secondary | ICD-10-CM

## 2011-05-09 DIAGNOSIS — I10 Essential (primary) hypertension: Secondary | ICD-10-CM

## 2011-05-09 DIAGNOSIS — E1069 Type 1 diabetes mellitus with other specified complication: Principal | ICD-10-CM | POA: Diagnosis present

## 2011-05-09 DIAGNOSIS — D75839 Thrombocytosis, unspecified: Secondary | ICD-10-CM

## 2011-05-09 DIAGNOSIS — E13621 Other specified diabetes mellitus with foot ulcer: Secondary | ICD-10-CM

## 2011-05-09 DIAGNOSIS — E1065 Type 1 diabetes mellitus with hyperglycemia: Secondary | ICD-10-CM | POA: Diagnosis present

## 2011-05-09 DIAGNOSIS — E11319 Type 2 diabetes mellitus with unspecified diabetic retinopathy without macular edema: Secondary | ICD-10-CM | POA: Diagnosis present

## 2011-05-09 DIAGNOSIS — IMO0002 Reserved for concepts with insufficient information to code with codable children: Principal | ICD-10-CM | POA: Insufficient documentation

## 2011-05-09 DIAGNOSIS — E1010 Type 1 diabetes mellitus with ketoacidosis without coma: Secondary | ICD-10-CM | POA: Diagnosis present

## 2011-05-09 DIAGNOSIS — E11610 Type 2 diabetes mellitus with diabetic neuropathic arthropathy: Secondary | ICD-10-CM | POA: Diagnosis present

## 2011-05-09 DIAGNOSIS — E119 Type 2 diabetes mellitus without complications: Secondary | ICD-10-CM

## 2011-05-09 DIAGNOSIS — L03119 Cellulitis of unspecified part of limb: Secondary | ICD-10-CM

## 2011-05-09 DIAGNOSIS — M949 Disorder of cartilage, unspecified: Secondary | ICD-10-CM

## 2011-05-09 HISTORY — DX: Type 2 diabetes mellitus with diabetic neuropathy, unspecified: E11.40

## 2011-05-09 HISTORY — DX: Essential (primary) hypertension: I10

## 2011-05-09 HISTORY — DX: Pure hypercholesterolemia, unspecified: E78.00

## 2011-05-09 LAB — BASIC METABOLIC PANEL
BUN: 32 mg/dL — ABNORMAL HIGH (ref 6–23)
CO2: 22 mEq/L (ref 19–32)
Chloride: 104 mEq/L (ref 96–112)
Creatinine, Ser: 1 mg/dL (ref 0.50–1.10)
GFR calc Af Amer: 81 mL/min — ABNORMAL LOW (ref >90)
Glucose, Bld: 174 mg/dL — ABNORMAL HIGH (ref 70–99)

## 2011-05-09 LAB — CBC
HCT: 28.4 % — ABNORMAL LOW (ref 36.0–46.0)
MCV: 67.3 fL — ABNORMAL LOW (ref 78.0–100.0)
RDW: 19.3 % — ABNORMAL HIGH (ref 11.5–15.5)
WBC: 13.2 10*3/uL — ABNORMAL HIGH (ref 4.0–10.5)

## 2011-05-09 LAB — GLUCOSE, CAPILLARY
Glucose-Capillary: 130 mg/dL — ABNORMAL HIGH (ref 70–99)
Glucose-Capillary: 64 mg/dL — ABNORMAL LOW (ref 70–99)
Glucose-Capillary: 86 mg/dL (ref 70–99)

## 2011-05-09 MED ORDER — ALUM & MAG HYDROXIDE-SIMETH 200-200-20 MG/5ML PO SUSP: 30.0000 mL | Freq: Four times a day (QID) | ORAL | Status: DC | PRN

## 2011-05-09 MED ORDER — FERROUS FUMARATE 325 (106 FE) MG PO TABS
1.0000 | ORAL_TABLET | Freq: Every day | ORAL | Status: DC
  Administered 2011-05-10 – 2011-05-14 (×5): 106 mg via ORAL
  Filled 2011-05-09 (×5): qty 1

## 2011-05-09 MED ORDER — ACETAMINOPHEN 325 MG PO TABS: 650.0000 mg | ORAL_TABLET | Freq: Four times a day (QID) | ORAL | Status: DC | PRN

## 2011-05-09 MED ORDER — SODIUM CHLORIDE 0.9 % IJ SOLN
3.0000 mL | Freq: Two times a day (BID) | INTRAMUSCULAR | Status: DC
  Administered 2011-05-09 – 2011-05-11 (×5): 3 mL via INTRAVENOUS

## 2011-05-09 MED ORDER — FERROUS FUMARATE 325 (106 FE) MG PO TABS
1.0000 | ORAL_TABLET | Freq: Every day | ORAL | Status: DC
  Filled 2011-05-09: qty 1

## 2011-05-09 MED ORDER — VANCOMYCIN HCL 1000 MG IV SOLR
750.0000 mg | Freq: Two times a day (BID) | INTRAVENOUS | Status: DC
  Administered 2011-05-09 – 2011-05-12 (×7): 750 mg via INTRAVENOUS
  Filled 2011-05-09 (×9): qty 750

## 2011-05-09 MED ORDER — HYDROCODONE-ACETAMINOPHEN 10-325 MG PO TABS
1.0000 | ORAL_TABLET | ORAL | Status: DC | PRN
  Administered 2011-05-10 – 2011-05-12 (×7): 1 via ORAL
  Filled 2011-05-09 (×3): qty 1
  Filled 2011-05-09: qty 2
  Filled 2011-05-09 (×3): qty 1

## 2011-05-09 MED ORDER — INSULIN GLARGINE 100 UNIT/ML ~~LOC~~ SOLN
10.0000 [IU] | Freq: Every day | SUBCUTANEOUS | Status: DC
  Filled 2011-05-09: qty 3

## 2011-05-09 MED ORDER — LISINOPRIL 2.5 MG PO TABS
2.5000 mg | ORAL_TABLET | Freq: Every day | ORAL | Status: DC
  Administered 2011-05-09 – 2011-05-14 (×6): 2.5 mg via ORAL
  Filled 2011-05-09 (×6): qty 1

## 2011-05-09 MED ORDER — SODIUM CHLORIDE 0.9 % IV SOLN: 250.0000 mL | INTRAVENOUS | Status: DC | PRN

## 2011-05-09 MED ORDER — ACETAMINOPHEN 650 MG RE SUPP: 650.0000 mg | Freq: Four times a day (QID) | RECTAL | Status: DC | PRN

## 2011-05-09 MED ORDER — SENNOSIDES-DOCUSATE SODIUM 8.6-50 MG PO TABS: 1.0000 | ORAL_TABLET | Freq: Every evening | ORAL | Status: DC | PRN

## 2011-05-09 MED ORDER — INSULIN ASPART 100 UNIT/ML ~~LOC~~ SOLN
3.0000 [IU] | Freq: Three times a day (TID) | SUBCUTANEOUS | Status: DC
  Administered 2011-05-10 – 2011-05-14 (×12): 3 [IU] via SUBCUTANEOUS

## 2011-05-09 MED ORDER — INSULIN ASPART 100 UNIT/ML ~~LOC~~ SOLN
0.0000 [IU] | Freq: Three times a day (TID) | SUBCUTANEOUS | Status: DC
  Administered 2011-05-10: 3 [IU] via SUBCUTANEOUS
  Administered 2011-05-10: 7 [IU] via SUBCUTANEOUS
  Administered 2011-05-10 – 2011-05-11 (×2): 3 [IU] via SUBCUTANEOUS
  Administered 2011-05-11 (×2): 7 [IU] via SUBCUTANEOUS
  Administered 2011-05-12: 1 [IU] via SUBCUTANEOUS
  Administered 2011-05-12: 2 [IU] via SUBCUTANEOUS
  Administered 2011-05-13 – 2011-05-14 (×2): 7 [IU] via SUBCUTANEOUS
  Administered 2011-05-14: 1 [IU] via SUBCUTANEOUS
  Administered 2011-05-14: 9 [IU] via SUBCUTANEOUS
  Filled 2011-05-09: qty 3

## 2011-05-09 MED ORDER — VANCOMYCIN HCL IN DEXTROSE 1-5 GM/200ML-% IV SOLN
1000.0000 mg | Freq: Once | INTRAVENOUS | Status: AC
  Administered 2011-05-09: 1000 mg via INTRAVENOUS
  Filled 2011-05-09: qty 200

## 2011-05-09 MED ORDER — ONDANSETRON HCL 4 MG PO TABS
4.0000 mg | ORAL_TABLET | Freq: Four times a day (QID) | ORAL | Status: DC | PRN
  Administered 2011-05-13: 4 mg via ORAL
  Filled 2011-05-09 (×2): qty 1

## 2011-05-09 MED ORDER — ONDANSETRON HCL 4 MG/2ML IJ SOLN: 4.0000 mg | Freq: Four times a day (QID) | INTRAMUSCULAR | Status: DC | PRN

## 2011-05-09 MED ORDER — SODIUM CHLORIDE 0.9 % IJ SOLN
3.0000 mL | INTRAMUSCULAR | Status: DC | PRN
  Administered 2011-05-12: 3 mL via INTRAVENOUS

## 2011-05-09 MED ORDER — SIMVASTATIN 10 MG PO TABS
10.0000 mg | ORAL_TABLET | Freq: Every day | ORAL | Status: DC
  Administered 2011-05-10 – 2011-05-14 (×5): 10 mg via ORAL
  Filled 2011-05-09 (×5): qty 1

## 2011-05-09 MED ORDER — ENOXAPARIN SODIUM 40 MG/0.4ML ~~LOC~~ SOLN
40.0000 mg | SUBCUTANEOUS | Status: DC
  Administered 2011-05-09 – 2011-05-13 (×5): 40 mg via SUBCUTANEOUS
  Filled 2011-05-09 (×5): qty 0.4

## 2011-05-09 MED ORDER — INSULIN ASPART 100 UNIT/ML ~~LOC~~ SOLN: 0.0000 [IU] | Freq: Every day | SUBCUTANEOUS | Status: DC

## 2011-05-09 MED ORDER — PIPERACILLIN-TAZOBACTAM 3.375 G IVPB
3.3750 g | Freq: Three times a day (TID) | INTRAVENOUS | Status: DC
  Administered 2011-05-09 – 2011-05-14 (×15): 3.375 g via INTRAVENOUS
  Filled 2011-05-09 (×17): qty 50

## 2011-05-09 NOTE — ED Notes (Signed)
Pt has ulcer to bottom of left foot that has been present since the middle of December.  Pt has recently taken a round of antibiotics and was seen by her Podiatrist today.  Pt was told to come here for IV antibiotics.  Pt states the wound is getting worse, painful and swollen.  Pt has un-stageable wound to bottom of left foot, opening approx 1/2 cm, noted yellow eschar, edges well approximated.  Some drainage noted, yellow color, scant amount.  Noted redness and swelling surrounding wound, swelling continues up to knee.

## 2011-05-09 NOTE — H&P (Signed)
PCP:   Delman Cheadle, MD, MD   Chief Complaint:  Foot pain  HPI: 41 year old woman with lifelong history of diabetes mellitus type 1, complicated by peripheral neuropathy, nephropathy and retinopathy presented to emergency room today with worsening left foot pain edema and erythema in and around an area of a chronic diabetic Foot ulcer. The patient also has been vomiting the antibiotic prescribed by her outpatient physician. She has been under the care of Dr. Ila Mcgill.   Review of Systems:  She reports abnormal periods and she is worried that she might be pregnant The patient denies anorexia, fever, weight loss,,  decreased hearing, hoarseness, chest pain, syncope, dyspnea on exertion, peripheral edema, balance deficits, hemoptysis, abdominal pain, melena, hematochezia, severe indigestion/heartburn, hematuria, incontinence, muscle weakness, suspicious skin lesions, transient blindness, difficulty walking, depression, unusual weight change,   Past Medical History: Past Medical History  Diagnosis Date  . Diabetes mellitus   . Neuropathy in diabetes   . Hypertension   . Hypercholesteremia    History reviewed. No pertinent past surgical history.  Medications: Prior to Admission medications   Medication Sig Start Date End Date Taking? Authorizing Provider  ferrous fumarate (HEMOCYTE - 106 MG FE) 325 (106 FE) MG TABS Take 1 tablet by mouth.   Yes Historical Provider, MD  hydrocodone-acetaminophen (LORCET-HD) 5-500 MG per capsule Take 1 capsule by mouth every 6 (six) hours as needed. For pain   Yes Historical Provider, MD  HYDROcodone-acetaminophen (VICODIN ES) 7.5-750 MG per tablet Take 1 tablet by mouth every 6 (six) hours as needed. For pain   Yes Historical Provider, MD  insulin glargine (LANTUS) 100 UNIT/ML injection Inject 10-15 Units into the skin 2 (two) times daily.    Yes Historical Provider, MD  insulin lispro (HUMALOG) 100 UNIT/ML injection Inject 5-7 Units into the skin 3 (three)  times daily before meals.    Yes Historical Provider, MD  lisinopril (PRINIVIL,ZESTRIL) 2.5 MG tablet Take 2.5 mg by mouth daily.   Yes Historical Provider, MD  pravastatin (PRAVACHOL) 20 MG tablet Take 20 mg by mouth daily.   Yes Historical Provider, MD    Allergies:  No Known Allergies  Social History:  reports that she has never smoked. She does not have any smokeless tobacco history on file. She reports that she drinks alcohol. She reports that she does not use illicit drugs.  History   Social History Narrative  . No narrative on file   Zenia Resides with 2 roommates and her 26 year old son  Family History: History reviewed. No pertinent family history.  Physical Exam: Filed Vitals:   05/09/11 1815 05/09/11 1914 05/09/11 2025 05/09/11 2143  BP: 123/73 119/68 133/76 133/78  Pulse: 92 90 89 86  Temp: 98.5 F (36.9 C) 99.1 F (37.3 C) 98.4 F (36.9 C)   TempSrc: Oral Oral Oral   Resp: 16 16 18 18   Height:      Weight:      SpO2: 100% 100% 100% 100%   Alert oriented x3 Head  Normocephalic atraumatic Eyes pupils equal round react to light, and accommodation Extraocular movement intact Throat clear no pharyngeal exudate Neck without thyromegaly or carotid bruits Heart regular rate and rhythm without murmurs rubs or gallops Chest clear to auscultation bilaterally without wheeze rhonchi crackles Abdomen soft nontender nondistended no hepatosplenomegaly Left foot with edema and erythema of the dorsal and plantar aspect, there was a 1 cm in diameter ulcer on the plantar aspect of the left foot without major drainage.   Labs on  Admission:   [facility] 05/09/11 1705  NA 137  K 4.3  CL 104  CO2 22  GLUCOSE 174*  BUN 32*  CREATININE 1.00  CALCIUM 8.9  MG --  PHOS --    Basename 05/09/11 1705  WBC 13.2*  NEUTROABS --  HGB 9.0*  HCT 28.4*  MCV 67.3*  PLT 493*    Radiological Exams on Admission: Dg Foot Complete Left  05/09/2011  *RADIOLOGY REPORT*  Clinical Data:  Ulceration, diabetes  LEFT FOOT - COMPLETE 3+ VIEW  Comparison: 04/19/2011  Findings: Diffuse osteopenia noted with peripheral calcific atherosclerosis.  Normal alignment.  No displaced fracture.  No radiopaque foreign body.  No bone destruction or significant arthropathy.  Stable exam.  IMPRESSION: Osteopenia and peripheral atherosclerosis.  No acute osseous abnormality.  Original Report Authenticated By: Jerilynn Mages. Daryll Brod, M.D.   Dg Foot Complete Left  04/19/2011  *RADIOLOGY REPORT*  Clinical Data: Diabetic foot ulcer along plantar aspect of foot near first metatarsal.  LEFT FOOT - COMPLETE 3+ VIEW  Comparison: The  Findings: There is no evidence of bony destruction, fracture, soft tissue foreign body or soft tissue gas.  Bones are osteopenic.  No significant arthropathy identified.  No focal bony lesions.  IMPRESSION: No acute abnormalities.  No radiographic evidence of osteomyelitis.  Original Report Authenticated By: Azzie Roup, M.D.    Assessment/Plan 41 year old woman with type 1 diabetes admitted for a complicated diabetic foot ulcer with concern for myositis or osteomyelitis. The plan is to obtain an MRI of the foot with intravenous contrast and start her on empiric antibiotics with broad-spectrum vancomycin and Zosyn coverage for resistant pathogens After MRI tomorrow will discuss with podiatry to see if the patient needs surgical intervention.  Present on Admission:  .Neuropathy in diabetes .Hypertension .Hypercholesteremia .Foot ulcer due to secondary DM .Cellulitis and abscess .Iron deficiency anemia, unspecified .DM ketoacidosis type I, uncontrolled .Cellulitis and abscess of foot .Foot ulcer due to secondary DM  For the patient's known diabetes type 1 she will be continued on Lantus and NovoLog insulin with meals For the patient's known iron deficiency anemia she'll be continued on iron by mouth We will obtain a urine pregnancy test given the fact that the menses have been  irregular  Cecile Guevara 05/09/2011, 9:55 PM

## 2011-05-09 NOTE — ED Notes (Signed)
Report given to Sam, RN with [facility]. Attempted to call report to receiving unit and receiving nurse nor charge nurse available to take report.

## 2011-05-09 NOTE — ED Provider Notes (Signed)
History     CSN: [id number]  Arrival date & time 05/09/11  1624   First MD Initiated Contact with Patient 05/09/11 1632      Chief Complaint  Patient presents with  . Foot Ulcer  . Cellulitis    (Consider location/radiation/quality/duration/timing/severity/associated sxs/prior treatment) HPI Comments: Pt states that she has had a ulcer to the bottom of her foot since December:pt states that she had a blister and she pulled the skin off:pt states that she was seen on 1/17 at urgent care and given antibiotics which she just finished:pt states that she has been seeing the podiatrist the last week and he told her today that she may need some antibiotics:pt states that he also gave her a wedge shoe which is making her arch very sore  The history is provided by the patient. No language interpreter was used.    Past Medical History  Diagnosis Date  . Diabetes mellitus   . Neuropathy in diabetes   . Hypertension   . Hypercholesteremia     History reviewed. No pertinent past surgical history.  History reviewed. No pertinent family history.  History  Substance Use Topics  . Smoking status: Never Smoker   . Smokeless tobacco: Not on file  . Alcohol Use: Yes     Occasional    OB History    Grav Para Term Preterm Abortions TAB SAB Ect Mult Living                  Review of Systems  All other systems reviewed and are negative.    Allergies  Review of patient's allergies indicates no known allergies.  Home Medications   Current Outpatient Rx  Name Route Sig Dispense Refill  . FERROUS FUMARATE 325 (106 FE) MG PO TABS Oral Take 1 tablet by mouth.    Marland Kitchen HYDROCODONE-ACETAMINOPHEN 5-500 MG PO CAPS Oral Take 1 capsule by mouth every 6 (six) hours as needed. For pain    . HYDROCODONE-ACETAMINOPHEN 7.5-750 MG PO TABS Oral Take 1 tablet by mouth every 6 (six) hours as needed. For pain    . INSULIN GLARGINE 100 UNIT/ML [place] SOLN Subcutaneous Inject 10-15 Units into the skin 2 (two)  times daily.     . INSULIN LISPRO (HUMAN) 100 UNIT/ML [place] SOLN Subcutaneous Inject 5-7 Units into the skin 3 (three) times daily before meals.     Marland Kitchen LISINOPRIL 2.5 MG PO TABS Oral Take 2.5 mg by mouth daily.    Marland Kitchen PRAVASTATIN SODIUM 20 MG PO TABS Oral Take 20 mg by mouth daily.      BP 131/71  Pulse 105  Temp(Src) 100.1 F (37.8 C) (Oral)  Resp 22  Ht 5\' 8"  (1.727 m)  Wt 129 lb (58.514 kg)  BMI 19.61 kg/m2  SpO2 100%  LMP 05/03/2011  Physical Exam  Nursing note and vitals reviewed. Constitutional: She is oriented to person, place, and time. She appears well-developed and well-nourished.  HENT:  Head: Normocephalic and atraumatic.  Cardiovascular: Normal rate and regular rhythm.   Pulmonary/Chest: Effort normal and breath sounds normal.  Musculoskeletal: Normal range of motion.  Neurological: She is alert and oriented to person, place, and time.  Skin:       Pt has a.5cm opening to the bottom of the left foot with some yellow drainage to the area:pt has a some irritation and firmness noted in the arch:pt has some mild redness noted to the top of the left foot  Psychiatric: She has a normal mood  and affect.    ED Course  Procedures (including critical care time)  Labs Reviewed  CBC - Abnormal; Notable for the following:    WBC 13.2 (*)    Hemoglobin 9.0 (*)    HCT 28.4 (*)    MCV 67.3 (*)    MCH 21.3 (*)    RDW 19.3 (*)    Platelets 493 (*)    All other components within normal limits  BASIC METABOLIC PANEL - Abnormal; Notable for the following:    Glucose, Bld 174 (*)    BUN 32 (*)    GFR calc non Af Amer 70 (*)    GFR calc Af Amer 81 (*)    All other components within normal limits   Dg Foot Complete Left  05/09/2011  *RADIOLOGY REPORT*  Clinical Data: Ulceration, diabetes  LEFT FOOT - COMPLETE 3+ VIEW  Comparison: 04/19/2011  Findings: Diffuse osteopenia noted with peripheral calcific atherosclerosis.  Normal alignment.  No displaced fracture.  No radiopaque foreign  body.  No bone destruction or significant arthropathy.  Stable exam.  IMPRESSION: Osteopenia and peripheral atherosclerosis.  No acute osseous abnormality.  Original Report Authenticated By: Jerilynn Mages. Daryll Brod, M.D.     1. Diabetic foot ulcer   2. Anemia       MDM  Feel like pt needs to be admitted as her outpt follow up is not dependable and she already finished outpt treatment        Glendell Docker, NP 05/09/11 1828

## 2011-05-09 NOTE — Progress Notes (Signed)
ANTIBIOTIC CONSULT NOTE - INITIAL  Pharmacy Consult for  Vancomycin & Zosyn Indication:  Cellulitis/ diabetic foot ulcer, with concern for myositis or osteomyelitis  No Known Allergies  Patient Measurements: Height: 5\' 8"  (172.7 cm) Weight: 129 lb (58.514 kg) IBW/kg (Calculated) : 63.9  Dosing weight:  58.5 kg  Vital Signs: Temp: 98.4 F (36.9 C) (02/06 2025) Temp src: Oral (02/06 2025) BP: 133/78 mmHg (02/06 2143) Pulse Rate: 86  (02/06 2143)   Labs:  Basename 05/09/11 1705  WBC 13.2*  HGB 9.0*  PLT 493*  LABCREA --  CREATININE 1.00   Estimated Creatinine Clearance: 69.8 ml/min (by C-G formula based on Cr of 1).  Microbiology:    No culture data  Medical History: Past Medical History  Diagnosis Date  . Diabetes mellitus   . Neuropathy in diabetes   . Hypertension   . Hypercholesteremia    Assessment:    To begin Alpine for empiric coverage    MRI planned for 2/7 to help determine   Goal of Therapy:  Vancomycin trough level 15-20 mcg/ml;     - if no osteo, troughs of 10=15 mcg/ml will be adequate Appropriate Zosyn dose for renal function & infection  Plan:     Vancomycin 750 mg IV q12h    Zosyn 3.375 grams IV q8 hrs (each infused over 4 hrs)    Will f/u renal function for any need to adjust regimens.    Will f/u MRI and further plans.    Will consider Vancomycin trough level if >5 days of treatment needed.  Arty Baumgartner, [place] Pager: [phone] 05/09/2011,10:30 PM

## 2011-05-09 NOTE — ED Notes (Signed)
[facility] arrived. Report given to Heath on 5100.

## 2011-05-10 ENCOUNTER — Observation Stay (HOSPITAL_COMMUNITY): Payer: Medicaid Other

## 2011-05-10 DIAGNOSIS — Z9189 Other specified personal risk factors, not elsewhere classified: Secondary | ICD-10-CM | POA: Diagnosis present

## 2011-05-10 DIAGNOSIS — D473 Essential (hemorrhagic) thrombocythemia: Secondary | ICD-10-CM | POA: Diagnosis present

## 2011-05-10 DIAGNOSIS — D75839 Thrombocytosis, unspecified: Secondary | ICD-10-CM | POA: Diagnosis present

## 2011-05-10 LAB — GLUCOSE, CAPILLARY

## 2011-05-10 LAB — HEMOGLOBIN A1C
Hgb A1c MFr Bld: 11.9 % — ABNORMAL HIGH (ref <5.7)
Mean Plasma Glucose: 295 mg/dL — ABNORMAL HIGH (ref <117)

## 2011-05-10 LAB — MRSA PCR SCREENING: MRSA by PCR: NEGATIVE

## 2011-05-10 LAB — PREGNANCY, URINE: Preg Test, Ur: NEGATIVE

## 2011-05-10 MED ORDER — GADOBENATE DIMEGLUMINE 529 MG/ML IV SOLN
12.0000 mL | Freq: Once | INTRAVENOUS | Status: AC
  Administered 2011-05-10: 12 mL via INTRAVENOUS

## 2011-05-10 MED ORDER — INSULIN GLARGINE 100 UNIT/ML ~~LOC~~ SOLN: 15.0000 [IU] | Freq: Every day | SUBCUTANEOUS | Status: DC

## 2011-05-10 NOTE — ED Provider Notes (Signed)
Medical screening examination/treatment/procedure(s) were performed by non-physician practitioner and as supervising physician I was immediately available for consultation/collaboration.   Addis Bennie A. Lauris Poag, MD 05/10/11 1450

## 2011-05-10 NOTE — Progress Notes (Signed)
Patient ID: [name], female   DOB: [birth year], 41 y.o.   MRN: [id number]  Subjective: No events overnight. Patient denies chest pain, shortness of breath, abdominal pain.  Objective:  Vital signs in last 24 hours:  Filed Vitals:   05/09/11 1914 05/09/11 2025 05/09/11 2143 05/10/11 0616  BP: 119/68 133/76 133/78 132/72  Pulse: 90 89 86 92  Temp: 99.1 F (37.3 C) 98.4 F (36.9 C)  98.7 F (37.1 C)  TempSrc: Oral Oral  Oral  Resp: 16 18 18 18   Height:      Weight:      SpO2: 100% 100% 100% 99%    Intake/Output from previous day:  Intake/Output Summary (Last 24 hours) at 05/10/11 1319 Last data filed at 05/10/11 0900  Gross per 24 hour  Intake    360 ml  Output    375 ml  Net    -15 ml    Physical Exam: General: Alert, awake, oriented x3, in no acute distress. HEENT: No bruits, no goiter. Moist mucous membranes, no scleral icterus, no conjunctival pallor. Heart: Regular rate and rhythm, S1/S2 +, no murmurs, rubs, gallops. Lungs: Clear to auscultation bilaterally. No wheezing, no rhonchi, no rales.  Abdomen: Soft, nontender, nondistended, positive bowel sounds. Extremities: No clubbing or cyanosis, no pitting edema,  positive pedal pulses. Left foot erythema with warmth to touch, diabetic foot ulcer on the bottom of the left foot, no bone visible Neuro: Grossly nonfocal.  Lab Results:  Basic Metabolic Panel:    Component Value Date/Time   NA 137 05/09/2011 1705   K 4.3 05/09/2011 1705   CL 104 05/09/2011 1705   CO2 22 05/09/2011 1705   BUN 32* 05/09/2011 1705   CREATININE 1.00 05/09/2011 1705   GLUCOSE 174* 05/09/2011 1705   CALCIUM 8.9 05/09/2011 1705   CALCIUM 8.5 09/01/2009 2241   CBC:    Component Value Date/Time   WBC 13.2* 05/09/2011 1705   HGB 9.0* 05/09/2011 1705   HCT 28.4* 05/09/2011 1705   PLT 493* 05/09/2011 1705   MCV 67.3* 05/09/2011 1705   NEUTROABS 5.9 11/16/2010 0204   LYMPHSABS 1.9 11/16/2010 0204   MONOABS 0.5 11/16/2010 0204   EOSABS 0.2 11/16/2010 0204   BASOSABS 0.0 11/16/2010 0204      Lab 05/09/11 1705  WBC 13.2*  HGB 9.0*  HCT 28.4*  PLT 493*  MCV 67.3*  MCH 21.3*  MCHC 31.7  RDW 19.3*  LYMPHSABS --  MONOABS --  EOSABS --  BASOSABS --  BANDABS --    Lab 05/09/11 1705  NA 137  K 4.3  CL 104  CO2 22  GLUCOSE 174*  BUN 32*  CREATININE 1.00  CALCIUM 8.9  MG --    Recent Results (from the past 240 hour(s))  MRSA PCR SCREENING     Status: Normal   Collection Time   05/10/11 10:15 AM      Component Value Range Status Comment   MRSA by PCR NEGATIVE  NEGATIVE  Final     Studies/Results: Mr Foot Left W Wo Contrast 05/10/2011  IMPRESSION: Abnormal edema and enhancement of the medial and lateral sesamoids adjacent to the head of the first metatarsal.  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  No other evidence of osteomyelitis.  No soft tissue abscesses.  The soft tissue ulceration is immediately superficial to the sesamoids. The adjacent flexor tendon appears normal.   Dg Foot Complete Left 05/09/2011    IMPRESSION: Osteopenia and peripheral atherosclerosis.  No acute osseous abnormality.     Medications: Scheduled Meds:   . enoxaparin  40 mg Subcutaneous Q24H  . ferrous fumarate  1 tablet Oral Daily  . gadobenate dimeglumine  12 mL Intravenous Once  . insulin aspart  0-5 Units Subcutaneous QHS  . insulin aspart  0-9 Units Subcutaneous TID WC  . insulin aspart  3 Units Subcutaneous TID WC  . insulin glargine  10 Units Subcutaneous QHS  . lisinopril  2.5 mg Oral Daily  . piperacillin-tazobactam (ZOSYN)  IV  3.375 g Intravenous Q8H  . simvastatin  10 mg Oral q1800  . sodium chloride  3 mL Intravenous Q12H  . vancomycin  750 mg Intravenous Q12H  . vancomycin  1,000 mg Intravenous Once  . 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 - will notify podiatry today   Diabetes mellitus high risk - uncontrolled - start Lantus and SSI   Hypertension - currently well controlled   Hypercholesteremia - stable   Iron deficiency anemia, unspecified - Hg/Hct are pt's baseline   Thrombocytosis - platelets are trending down   EDUCATION - test results and diagnostic studies were discussed with patient  - patient verbalized the understanding - questions were answered at the bedside and contact information was provided for additional questions or concerns   LOS: 1 day   MAGICK-Lynasia Meloche 05/10/2011, 1:19 PM  [facility] Pager: [phone]

## 2011-05-11 LAB — CBC
HCT: 26.5 % — ABNORMAL LOW (ref 36.0–46.0)
MCH: 21.4 pg — ABNORMAL LOW (ref 26.0–34.0)
MCV: 69.9 fL — ABNORMAL LOW (ref 78.0–100.0)
RDW: 19.6 % — ABNORMAL HIGH (ref 11.5–15.5)
WBC: 7.5 10*3/uL (ref 4.0–10.5)

## 2011-05-11 LAB — GLUCOSE, CAPILLARY
Glucose-Capillary: 301 mg/dL — ABNORMAL HIGH (ref 70–99)
Glucose-Capillary: 244 mg/dL — ABNORMAL HIGH (ref 70–99)
Glucose-Capillary: 218 mg/dL — ABNORMAL HIGH (ref 70–99)

## 2011-05-11 LAB — SEDIMENTATION RATE: Sed Rate: 94 mm/hr — ABNORMAL HIGH (ref 0–22)

## 2011-05-11 LAB — BASIC METABOLIC PANEL
BUN: 24 mg/dL — ABNORMAL HIGH (ref 6–23)
CO2: 17 mEq/L — ABNORMAL LOW (ref 19–32)
Calcium: 8.6 mg/dL (ref 8.4–10.5)
Chloride: 106 mEq/L (ref 96–112)
Creatinine, Ser: 0.87 mg/dL (ref 0.50–1.10)
GFR calc Af Amer: >90 mL/min (ref >90)

## 2011-05-11 MED ORDER — INSULIN GLARGINE 100 UNIT/ML ~~LOC~~ SOLN
15.0000 [IU] | Freq: Two times a day (BID) | SUBCUTANEOUS | Status: DC
  Administered 2011-05-11 – 2011-05-12 (×2): 15 [IU] via SUBCUTANEOUS

## 2011-05-11 MED ORDER — INSULIN GLARGINE 100 UNIT/ML ~~LOC~~ SOLN
25.0000 [IU] | Freq: Every day | SUBCUTANEOUS | Status: DC
  Filled 2011-05-11: qty 3

## 2011-05-11 NOTE — Progress Notes (Signed)
Patient ID: [name], female   DOB: 03-30-1971, 41 y.o.   MRN: [id number]  Subjective: No events overnight. Patient denies chest pain, shortness of breath, abdominal pain.  Objective:  Vital signs in last 24 hours:  Filed Vitals:   05/10/11 0616 05/10/11 2135 05/11/11 0520 05/11/11 1300  BP: 132/72 134/76 126/67 135/76  Pulse: 92 89 82 90  Temp: 98.7 F (37.1 C) 98.9 F (37.2 C) 98.4 F (36.9 C) 99.1 F (37.3 C)  TempSrc: Oral   Oral  Resp: 18 18 18 17   Height:      Weight:      SpO2: 99% 100% 99% 100%    Intake/Output from previous day:  No intake or output data in the 24 hours ending 05/11/11 1413  Physical Exam: General: Alert, awake, oriented x3, in no acute distress. HEENT: No bruits, no goiter. Moist mucous membranes, no scleral icterus, no conjunctival pallor. Heart: Regular rate and rhythm, S1/S2 +, no murmurs, rubs, gallops. Lungs: Clear to auscultation bilaterally. No wheezing, no rhonchi, no rales.  Abdomen: Soft, nontender, nondistended, positive bowel sounds. Extremities: No clubbing or cyanosis, no pitting edema,  positive pedal pulses. Left foot erythema with warmth to touch, diabetic foot ulcer on the bottom of the left foot, no bone visible Neuro: Grossly nonfocal.  Lab Results:  Basic Metabolic Panel:    Component Value Date/Time   NA 134* 05/11/2011 0610   K 4.4 05/11/2011 0610   CL 106 05/11/2011 0610   CO2 17* 05/11/2011 0610   BUN 24* 05/11/2011 0610   CREATININE 0.87 05/11/2011 0610   GLUCOSE 385* 05/11/2011 0610   CALCIUM 8.6 05/11/2011 0610   CALCIUM 8.5 09/01/2009 2241   CBC:    Component Value Date/Time   WBC 7.5 05/11/2011 0610   HGB 8.1* 05/11/2011 0610   HCT 26.5* 05/11/2011 0610   PLT 421* 05/11/2011 0610   MCV 69.9* 05/11/2011 0610   NEUTROABS 5.9 11/16/2010 0204   LYMPHSABS 1.9 11/16/2010 0204   MONOABS 0.5 11/16/2010 0204   EOSABS 0.2 11/16/2010 0204   BASOSABS 0.0 11/16/2010 0204     Lab 05/11/11 0610 05/09/11 1705  WBC 7.5 13.2*  HGB  8.1* 9.0*  HCT 26.5* 28.4*  PLT 421* 493*  MCV 69.9* 67.3*  MCH 21.4* 21.3*  MCHC 30.6 31.7  RDW 19.6* 19.3*  LYMPHSABS -- --  MONOABS -- --  EOSABS -- --  BASOSABS -- --  BANDABS -- --    Lab 05/11/11 0610 05/09/11 1705  NA 134* 137  K 4.4 4.3  CL 106 104  CO2 17* 22  GLUCOSE 385* 174*  BUN 24* 32*  CREATININE 0.87 1.00  CALCIUM 8.6 8.9  MG -- --    Recent Results (from the past 240 hour(s))  MRSA PCR SCREENING     Status: Normal   Collection Time   05/10/11 10:15 AM      Component Value Range Status Comment   MRSA by PCR NEGATIVE  NEGATIVE  Final     Studies/Results: Mr Foot Left W Wo Contrast 05/10/2011 IMPRESSION: Abnormal edema and enhancement of the medial and lateral sesamoids adjacent to the head of the first metatarsal.  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  No other evidence of osteomyelitis.  No soft tissue abscesses.  The soft tissue ulceration is immediately superficial to the sesamoids. The adjacent flexor tendon appears normal.  Dg Foot Complete Left 05/09/2011   IMPRESSION: Osteopenia and peripheral atherosclerosis.  No acute osseous abnormality.  Medications: Scheduled Meds:   . enoxaparin  40 mg Subcutaneous Q24H  . ferrous fumarate  1 tablet Oral Daily  . insulin aspart  0-9 Units Subcutaneous TID WC  . insulin aspart  3 Units Subcutaneous TID WC  . insulin glargine  15 Units Subcutaneous QHS  . lisinopril  2.5 mg Oral Daily  . piperacillin-tazobactam (ZOSYN)  IV  3.375 g Intravenous Q8H  . simvastatin  10 mg Oral q1800  . sodium chloride  3 mL Intravenous Q12H  . 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  - will notify podiatry today   Diabetes mellitus high risk  - uncontrolled  - start Lantus and SSI   Hypertension  - currently well controlled   Hypercholesteremia  - stable   Iron deficiency anemia, unspecified  - Hg/Hct are pt's baseline  - anemia panel  Thrombocytosis  - platelets are trending down   EDUCATION  - test results and diagnostic studies were discussed with patient  - patient verbalized the understanding  - questions were answered at the bedside and contact information was provided for additional questions or concerns   LOS: 2 days   MAGICK-MYERS, ISKRA 05/11/2011, 2:13 PM  [facility] Pager: [phone]

## 2011-05-11 NOTE — Progress Notes (Signed)
05/11/11  Spoke with patient about her diabetes and insulin.  Patient has Type 1 diabetes since age 41 and knows her insulin needs.  She states that she takes her Lantus twice a day and on somewhat of a sliding scale.  States that she takes  Lantus 10-15 units twice a day.  Patient understands that she needs her insulin, but also knows how her body reacts.  Spoke with staff nurse.  Will continue to follow while in hospital.  Harvel Ricks RN BSN

## 2011-05-12 LAB — GLUCOSE, CAPILLARY
Glucose-Capillary: 140 mg/dL — ABNORMAL HIGH (ref 70–99)
Glucose-Capillary: 88 mg/dL (ref 70–99)

## 2011-05-12 LAB — BASIC METABOLIC PANEL
GFR calc Af Amer: 87 mL/min — ABNORMAL LOW (ref >90)
GFR calc non Af Amer: 75 mL/min — ABNORMAL LOW (ref >90)
Potassium: 4.1 mEq/L (ref 3.5–5.1)
Sodium: 134 mEq/L — ABNORMAL LOW (ref 135–145)

## 2011-05-12 LAB — CBC
MCHC: 30.1 g/dL (ref 30.0–36.0)
RDW: 19.3 % — ABNORMAL HIGH (ref 11.5–15.5)
WBC: 8.1 10*3/uL (ref 4.0–10.5)

## 2011-05-12 MED ORDER — INSULIN GLARGINE 100 UNIT/ML ~~LOC~~ SOLN: 18.0000 [IU] | Freq: Two times a day (BID) | SUBCUTANEOUS | Status: DC

## 2011-05-12 MED ORDER — VANCOMYCIN HCL 1000 MG IV SOLR: 750.0000 mg | Freq: Two times a day (BID) | INTRAVENOUS | Status: DC

## 2011-05-12 MED ORDER — PIPERACILLIN-TAZOBACTAM 3.375 G IVPB: 3.3750 g | Freq: Three times a day (TID) | INTRAVENOUS | Status: DC

## 2011-05-12 MED ORDER — HYDROCODONE-ACETAMINOPHEN 10-325 MG PO TABS: 1.0000 | ORAL_TABLET | ORAL | Status: AC | PRN

## 2011-05-12 MED ORDER — HYDROCODONE-ACETAMINOPHEN 10-325 MG PO TABS
1.0000 | ORAL_TABLET | ORAL | Status: DC | PRN
  Administered 2011-05-12 – 2011-05-13 (×4): 1 via ORAL
  Administered 2011-05-13: 2 via ORAL
  Administered 2011-05-13: 1 via ORAL
  Filled 2011-05-12: qty 2
  Filled 2011-05-12 (×5): qty 1

## 2011-05-12 NOTE — Progress Notes (Signed)
Patient ID: [name], female   DOB: 29-Sep-1970, 41 y.o.   MRN: [id number]  Subjective: No events overnight. Patient denies chest pain, shortness of breath, abdominal pain.   Objective:  Vital signs in last 24 hours:  Filed Vitals:   05/11/11 0520 05/11/11 1300 05/11/11 2143 05/12/11 0506  BP: 126/67 135/76 140/79 134/70  Pulse: 82 90 92 81  Temp: 98.4 F (36.9 C) 99.1 F (37.3 C) 98.4 F (36.9 C) 98.3 F (36.8 C)  TempSrc:  Oral    Resp: 18 17 16 16   Height:      Weight:      SpO2: 99% 100% 100% 99%    Intake/Output from previous day:   Intake/Output Summary (Last 24 hours) at 05/12/11 1358 Last data filed at 05/11/11 1500  Gross per 24 hour  Intake   1020 ml  Output      0 ml  Net   1020 ml    Physical Exam: General: Alert, awake, oriented x3, in no acute distress. HEENT: No bruits, no goiter. Moist mucous membranes, no scleral icterus, no conjunctival pallor. Heart: Regular rate and rhythm, S1/S2 +, no murmurs, rubs, gallops. Lungs: Clear to auscultation bilaterally. No wheezing, no rhonchi, no rales.  Abdomen: Soft, nontender, nondistended, positive bowel sounds. Extremities: No clubbing or cyanosis, positive pedal pulses. Left foot swelling and erythema not much improved since yesterday, bottom of the left foot with ulcer but no visible bone Neuro: Grossly nonfocal.  Lab Results:  Basic Metabolic Panel:    Component Value Date/Time   NA 134* 05/12/2011 0500   K 4.1 05/12/2011 0500   CL 107 05/12/2011 0500   CO2 20 05/12/2011 0500   BUN 23 05/12/2011 0500   CREATININE 0.94 05/12/2011 0500   GLUCOSE 246* 05/12/2011 0500   CALCIUM 8.9 05/12/2011 0500   CALCIUM 8.5 09/01/2009 2241   CBC:    Component Value Date/Time   WBC 8.1 05/12/2011 0500   HGB 8.1* 05/12/2011 0500   HCT 26.9* 05/12/2011 0500   PLT 427* 05/12/2011 0500   MCV 69.3* 05/12/2011 0500   NEUTROABS 5.9 11/16/2010 0204   LYMPHSABS 1.9 11/16/2010 0204   MONOABS 0.5 11/16/2010 0204   EOSABS 0.2 11/16/2010 0204     BASOSABS 0.0 11/16/2010 0204      Lab 05/12/11 0500 05/11/11 0610 05/09/11 1705  WBC 8.1 7.5 13.2*  HGB 8.1* 8.1* 9.0*  HCT 26.9* 26.5* 28.4*  PLT 427* 421* 493*  MCV 69.3* 69.9* 67.3*  MCH 20.9* 21.4* 21.3*  MCHC 30.1 30.6 31.7  RDW 19.3* 19.6* 19.3*  LYMPHSABS -- -- --  MONOABS -- -- --  EOSABS -- -- --  BASOSABS -- -- --  BANDABS -- -- --    Lab 05/12/11 0500 05/11/11 0610 05/09/11 1705  NA 134* 134* 137  K 4.1 4.4 4.3  CL 107 106 104  CO2 20 17* 22  GLUCOSE 246* 385* 174*  BUN 23 24* 32*  CREATININE 0.94 0.87 1.00  CALCIUM 8.9 8.6 8.9  MG -- -- --    Recent Results (from the past 240 hour(s))  MRSA PCR SCREENING     Status: Normal   Collection Time   05/10/11 10:15 AM      Component Value Range Status Comment   MRSA by PCR NEGATIVE  NEGATIVE  Final     Studies/Results: No results found.  Medications: Scheduled Meds:   . enoxaparin  40 mg Subcutaneous Q24H  . ferrous fumarate  1 tablet Oral  Daily  . insulin aspart  0-9 Units Subcutaneous TID WC  . insulin aspart  3 Units Subcutaneous TID WC  . insulin glargine  15 Units Subcutaneous BID  . lisinopril  2.5 mg Oral Daily  . piperacillin-tazobactam (ZOSYN)  IV  3.375 g Intravenous Q8H  . simvastatin  10 mg Oral q1800  . sodium chloride  3 mL Intravenous Q12H  . vancomycin  750 mg Intravenous Q12H  . DISCONTD: insulin glargine  15 Units Subcutaneous QHS  . DISCONTD: insulin glargine  25 Units Subcutaneous QHS   Continuous Infusions:  PRN Meds:.sodium chloride, acetaminophen, acetaminophen, alum & mag hydroxide-simeth, HYDROcodone-acetaminophen, ondansetron (ZOFRAN) IV, ondansetron, senna-docusate, sodium chloride  Assessment/Plan:  Principal Problem:  *Cellulitis and abscess of foot - ESR elevated and suggestive of osteomyelitis in addition to cellulitis - will continue broad spectrum abx for now - discussed with ortho on call and the recommendation was to continue abx for 4-6 week and since no bone  visible will most likely not require surgery - will continue wound care daily - reassess in AM - ensure diabetes control  Active Problems:  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

## 2011-05-12 NOTE — Progress Notes (Signed)
ANTIBIOTIC CONSULT NOTE - FOLLOW UP  Pharmacy Consult for vancomycin/zosyn Indication: Cellulitis/diabetic foot ulcer/OM  No Known Allergies  Patient Measurements: Height: 5\' 8"  (172.7 cm) Weight: 129 lb (58.514 kg) IBW/kg (Calculated) : 63.9  Adjusted Body Weight:   Vital Signs: Temp: 98.3 F (36.8 C) (02/09 0506) BP: 134/70 mmHg (02/09 0506) Pulse Rate: 81  (02/09 0506) Intake/Output from previous day: 02/08 0701 - 02/09 0700 In: 1020 [P.O.:720; IV Piggyback:300] Out: -  Intake/Output from this shift:    Labs:  Basename 05/12/11 0500 05/11/11 0610 05/09/11 1705  WBC 8.1 7.5 13.2*  HGB 8.1* 8.1* 9.0*  PLT 427* 421* 493*  LABCREA -- -- --  CREATININE 0.94 0.87 1.00   Estimated Creatinine Clearance: 74.2 ml/min (by C-G formula based on Cr of 0.94).   Microbiology: Recent Results (from the past 720 hour(s))  MRSA PCR SCREENING     Status: Normal   Collection Time   05/10/11 10:15 AM      Component Value Range Status Comment   MRSA by PCR NEGATIVE  NEGATIVE  Final     Anti-infectives     Start     Dose/Rate Route Frequency Ordered Stop   05/12/11 0000   sodium chloride 0.9 % SOLN 150 mL with vancomycin 1000 MG SOLR 750 mg        750 mg 150 mL/hr over 60 Minutes Intravenous Every 12 hours 05/12/11 0937 06/09/11 2359   05/12/11 0000  piperacillin-tazobactam (ZOSYN) 3-0.375 GM/50ML IVPB       3.375 g 12.5 mL/hr over 240 Minutes Intravenous Every 8 hours 05/12/11 0937 06/09/11 2359   05/09/11 2300   vancomycin (VANCOCIN) 750 mg in sodium chloride 0.9 % 150 mL IVPB        750 mg 150 mL/hr over 60 Minutes Intravenous Every 12 hours 05/09/11 2227     05/09/11 2230  piperacillin-tazobactam (ZOSYN) IVPB 3.375 g       3.375 g 12.5 mL/hr over 240 Minutes Intravenous 3 times per day 05/09/11 2227     05/09/11 1700   vancomycin (VANCOCIN) IVPB 1000 mg/200 mL premix        1,000 mg 200 mL/hr over 60 Minutes Intravenous  Once 05/09/11 1646 05/09/11 1819           Assessment: 61 YOF admitted with foot pain. She is a type 1 diabetic with ulcer on L heel. MRI reveals osteomyelitis of sesamoids. No cultures have been done. Day #3 vanco/zosyn.  Patient is afebrile and WBC is now WNL. SCr is stable.  Vancomycin was to be drawn this am, but dose was given prior to lab being drawn.  Goal of Therapy:  Vancomycin trough level 15-20 mcg/ml  Plan:  1. Continue vancomycin 750mg  IV q12h, and re-attempt trough level tom 2/10.  Consider cultures if able to obtain appropriate culture of wound, although she has been on abx for 3 days . ? ID evaluation appropriate.   [name] 05/12/2011,11:06 AM

## 2011-05-13 LAB — CBC
HCT: 26.6 % — ABNORMAL LOW (ref 36.0–46.0)
MCHC: 29.7 g/dL — ABNORMAL LOW (ref 30.0–36.0)
MCV: 69.3 fL — ABNORMAL LOW (ref 78.0–100.0)
Platelets: 501 10*3/uL — ABNORMAL HIGH (ref 150–400)
RDW: 19.6 % — ABNORMAL HIGH (ref 11.5–15.5)
WBC: 7 10*3/uL (ref 4.0–10.5)

## 2011-05-13 LAB — BASIC METABOLIC PANEL
BUN: 19 mg/dL (ref 6–23)
Creatinine, Ser: 0.97 mg/dL (ref 0.50–1.10)
GFR calc Af Amer: 84 mL/min — ABNORMAL LOW (ref >90)
GFR calc non Af Amer: 73 mL/min — ABNORMAL LOW (ref >90)
Potassium: 4 mEq/L (ref 3.5–5.1)

## 2011-05-13 LAB — GLUCOSE, CAPILLARY
Glucose-Capillary: 62 mg/dL — ABNORMAL LOW (ref 70–99)
Glucose-Capillary: 143 mg/dL — ABNORMAL HIGH (ref 70–99)
Glucose-Capillary: 305 mg/dL — ABNORMAL HIGH (ref 70–99)
Glucose-Capillary: 55 mg/dL — ABNORMAL LOW (ref 70–99)

## 2011-05-13 MED ORDER — DEXTROSE 50 % IV SOLN
INTRAVENOUS | Status: AC
  Administered 2011-05-13: 25 mL
  Filled 2011-05-13: qty 50

## 2011-05-13 MED ORDER — HYDROCODONE-ACETAMINOPHEN 10-325 MG PO TABS
2.0000 | ORAL_TABLET | ORAL | Status: DC | PRN
  Administered 2011-05-14 (×3): 2 via ORAL
  Filled 2011-05-13 (×3): qty 2

## 2011-05-13 MED ORDER — SODIUM CHLORIDE 0.9 % IJ SOLN: 10.0000 mL | INTRAMUSCULAR | Status: DC | PRN

## 2011-05-13 MED ORDER — SODIUM CHLORIDE 0.9 % IJ SOLN: 10.0000 mL | Freq: Two times a day (BID) | INTRAMUSCULAR | Status: DC

## 2011-05-13 MED ORDER — INSULIN GLARGINE 100 UNIT/ML ~~LOC~~ SOLN
15.0000 [IU] | Freq: Two times a day (BID) | SUBCUTANEOUS | Status: DC
  Administered 2011-05-14: 15 [IU] via SUBCUTANEOUS

## 2011-05-13 MED ORDER — VANCOMYCIN HCL 500 MG IV SOLR
500.0000 mg | Freq: Two times a day (BID) | INTRAVENOUS | Status: DC
  Administered 2011-05-13 – 2011-05-14 (×3): 500 mg via INTRAVENOUS
  Filled 2011-05-13 (×4): qty 500

## 2011-05-13 MED ORDER — INSULIN GLARGINE 100 UNIT/ML ~~LOC~~ SOLN
10.0000 [IU] | Freq: Once | SUBCUTANEOUS | Status: AC
  Administered 2011-05-13: 10 [IU] via SUBCUTANEOUS

## 2011-05-13 NOTE — Progress Notes (Signed)
ANTIBIOTIC CONSULT NOTE - FOLLOW UP  Pharmacy Consult for vancomycin/zosyn Indication: Cellulitis/diabetic foot ulcer/OM  No Known Allergies  Patient Measurements: Height: 5\' 8"  (172.7 cm) Weight: 129 lb (58.514 kg) IBW/kg (Calculated) : 63.9  Adjusted Body Weight:   Vital Signs: Temp: 99.1 F (37.3 C) (02/10 0543) BP: 138/68 mmHg (02/10 0543) Pulse Rate: 83  (02/10 0543) Intake/Output from previous day: 02/09 0701 - 02/10 0700 In: 1080 [P.O.:1080] Out: -  Intake/Output from this shift: Total I/O In: 360 [P.O.:360] Out: -   Labs:  Basename 05/13/11 0635 05/12/11 0500 05/11/11 0610  WBC 7.0 8.1 7.5  HGB 7.9* 8.1* 8.1*  PLT 501* 427* 421*  LABCREA -- -- --  CREATININE 0.97 0.94 0.87   Estimated Creatinine Clearance: 71.9 ml/min (by C-G formula based on Cr of 0.97).   Microbiology: Recent Results (from the past 720 hour(s))  MRSA PCR SCREENING     Status: Normal   Collection Time   05/10/11 10:15 AM      Component Value Range Status Comment   MRSA by PCR NEGATIVE  NEGATIVE  Final     Anti-infectives     Start     Dose/Rate Route Frequency Ordered Stop   05/12/11 0000   sodium chloride 0.9 % SOLN 150 mL with vancomycin 1000 MG SOLR 750 mg        750 mg 150 mL/hr over 60 Minutes Intravenous Every 12 hours 05/12/11 0937 06/09/11 2359   05/12/11 0000   piperacillin-tazobactam (ZOSYN) 3-0.375 GM/50ML IVPB        3.375 g 12.5 mL/hr over 240 Minutes Intravenous Every 8 hours 05/12/11 0937 06/09/11 2359   05/09/11 2300   vancomycin (VANCOCIN) 750 mg in sodium chloride 0.9 % 150 mL IVPB        750 mg 150 mL/hr over 60 Minutes Intravenous Every 12 hours 05/09/11 2227     05/09/11 2230   piperacillin-tazobactam (ZOSYN) IVPB 3.375 g        3.375 g 12.5 mL/hr over 240 Minutes Intravenous 3 times per day 05/09/11 2227     05/09/11 1700   vancomycin (VANCOCIN) IVPB 1000 mg/200 mL premix        1,000 mg 200 mL/hr over 60 Minutes Intravenous  Once 05/09/11 1646 05/09/11  1819          Assessment: 89 YOF admitted with foot pain. She is a type 1 diabetic with ulcer on L heel. MRI reveals osteomyelitis of sesamoids. No cultures have been done. Day #4 vanco/zosyn.  Patient is afebrile and WBC is now WNL. SCr is stable.  Vancomycin trough = 23.1 mcg/ml, This is a true trough.   Goal of Therapy:  Vancomycin trough level 15-20 mcg/ml  Plan:  1. Based on elevated trough,  Change vancomycin 500mg  IV q12h.  Estimated Css trough with new dose = 15.95mcg/ml.  Consider cultures if able to obtain appropriate culture of wound, although she has been on abx for 4 days . Consider ID evaluation for guidance of abx selections since will need long-term abx.  2. Zosyn dose remains appropriate for renal fx  Clovis Riley 05/13/2011,11:08 AM

## 2011-05-13 NOTE — Progress Notes (Signed)
CBG:51  Treatment: 15 GM carbohydrate snack  Symptoms: Nervous/irritable  Follow-up CBG: [id number] CBG Result:61  Possible Reasons for Event: Unknown  Comments/MD notified:yes    Alena Bills A

## 2011-05-13 NOTE — Progress Notes (Signed)
MD notified of CBG, after 69ml. Dextrose IV CBG 124

## 2011-05-13 NOTE — Plan of Care (Signed)
Problem: Phase II Progression Outcomes Goal: Wound without signs/symptoms of infection, decreasing edema Outcome: Not Progressing Increased pain,swelling,redness

## 2011-05-13 NOTE — Progress Notes (Signed)
Patient ID: [name], female   DOB: 12/24/1970, 41 y.o.   MRN: [id number]  Subjective: No events overnight. Patient denies chest pain, shortness of breath, abdominal pain.   Objective:  Vital signs in last 24 hours:  Filed Vitals:   05/12/11 1405 05/12/11 2209 05/13/11 0543 05/13/11 1350  BP: 137/75 144/76 138/68 139/73  Pulse: 72 85 83 87  Temp: 98.3 F (36.8 C) 99.5 F (37.5 C) 99.1 F (37.3 C) 99.2 F (37.3 C)  TempSrc: Oral Oral  Oral  Resp: 18 18 18 18   Height:      Weight:      SpO2: 100% 95% 100% 100%    Intake/Output from previous day:   Intake/Output Summary (Last 24 hours) at 05/13/11 2148 Last data filed at 05/13/11 1800  Gross per 24 hour  Intake   1080 ml  Output      0 ml  Net   1080 ml    Physical Exam: General: Alert, awake, oriented x3, in no acute distress. HEENT: No bruits, no goiter. Moist mucous membranes, no scleral icterus, no conjunctival pallor. Heart: Regular rate and rhythm, S1/S2 +, no murmurs, rubs, gallops. Lungs: Clear to auscultation bilaterally. No wheezing, no rhonchi, no rales.  Abdomen: Soft, nontender, nondistended, positive bowel sounds. Extremities: No clubbing or cyanosis, no pitting edema,  positive pedal pulses.Improving foot ulcer on the left bottom of the foot, erythema improving Neuro: Grossly nonfocal.  Lab Results:  Basic Metabolic Panel:    Component Value Date/Time   NA 141 05/13/2011 0635   K 4.0 05/13/2011 0635   CL 110 05/13/2011 0635   CO2 22 05/13/2011 0635   BUN 19 05/13/2011 0635   CREATININE 0.97 05/13/2011 0635   GLUCOSE 50* 05/13/2011 0635   CALCIUM 9.1 05/13/2011 0635   CALCIUM 8.5 09/01/2009 2241   CBC:    Component Value Date/Time   WBC 7.0 05/13/2011 0635   HGB 7.9* 05/13/2011 0635   HCT 26.6* 05/13/2011 0635   PLT 501* 05/13/2011 0635   MCV 69.3* 05/13/2011 0635   NEUTROABS 5.9 11/16/2010 0204   LYMPHSABS 1.9 11/16/2010 0204   MONOABS 0.5 11/16/2010 0204   EOSABS 0.2 11/16/2010 0204   BASOSABS  0.0 11/16/2010 0204      Lab 05/13/11 0635 05/12/11 0500 05/11/11 0610 05/09/11 1705  WBC 7.0 8.1 7.5 13.2*  HGB 7.9* 8.1* 8.1* 9.0*  HCT 26.6* 26.9* 26.5* 28.4*  PLT 501* 427* 421* 493*  MCV 69.3* 69.3* 69.9* 67.3*  MCH 20.6* 20.9* 21.4* 21.3*  MCHC 29.7* 30.1 30.6 31.7  RDW 19.6* 19.3* 19.6* 19.3*  LYMPHSABS -- -- -- --  MONOABS -- -- -- --  EOSABS -- -- -- --  BASOSABS -- -- -- --  BANDABS -- -- -- --    Lab 05/13/11 0635 05/12/11 0500 05/11/11 0610 05/09/11 1705  NA 141 134* 134* 137  K 4.0 4.1 4.4 4.3  CL 110 107 106 104  CO2 22 20 17* 22  GLUCOSE 50* 246* 385* 174*  BUN 19 23 24* 32*  CREATININE 0.97 0.94 0.87 1.00  CALCIUM 9.1 8.9 8.6 8.9  MG -- -- -- --    Recent Results (from the past 240 hour(s))  MRSA PCR SCREENING     Status: Normal   Collection Time   05/10/11 10:15 AM      Component Value Range Status Comment   MRSA by PCR NEGATIVE  NEGATIVE  Final     Studies/Results: No results found.  Medications:  Scheduled Meds:   . dextrose      . enoxaparin  40 mg Subcutaneous Q24H  . ferrous fumarate  1 tablet Oral Daily  . insulin aspart  0-9 Units Subcutaneous TID WC  . insulin aspart  3 Units Subcutaneous TID WC  . insulin glargine  10 Units Subcutaneous Once  . insulin glargine  18 Units Subcutaneous BID  . lisinopril  2.5 mg Oral Daily  . piperacillin-tazobactam (ZOSYN)  IV  3.375 g Intravenous Q8H  . simvastatin  10 mg Oral q1800  . sodium chloride  10-40 mL Intracatheter Q12H  . sodium chloride  3 mL Intravenous Q12H  . vancomycin  500 mg Intravenous Q12H  . DISCONTD: vancomycin  750 mg Intravenous Q12H   Continuous Infusions:  PRN Meds:.sodium chloride, acetaminophen, acetaminophen, alum & mag hydroxide-simeth, HYDROcodone-acetaminophen, ondansetron (ZOFRAN) IV, ondansetron, senna-docusate, sodium chloride, sodium chloride  Assessment/Plan: Principal Problem:  *Cellulitis and abscess of foot  - ESR elevated and suggestive of osteomyelitis  in addition to cellulitis  - will continue broad spectrum abx for now  - discussed with ortho on call and the recommendation was to continue abx for 4-6 week and since no bone visible will most likely not require surgery  - will continue wound care daily  - reassess in AM  - ensure diabetes control   Active Problems:  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

## 2011-05-14 DIAGNOSIS — M7989 Other specified soft tissue disorders: Secondary | ICD-10-CM

## 2011-05-14 LAB — GLUCOSE, CAPILLARY
Glucose-Capillary: 320 mg/dL — ABNORMAL HIGH (ref 70–99)
Glucose-Capillary: 141 mg/dL — ABNORMAL HIGH (ref 70–99)

## 2011-05-14 LAB — CBC
MCH: 21.4 pg — ABNORMAL LOW (ref 26.0–34.0)
MCV: 69.6 fL — ABNORMAL LOW (ref 78.0–100.0)
Platelets: 422 10*3/uL — ABNORMAL HIGH (ref 150–400)
RBC: 3.13 MIL/uL — ABNORMAL LOW (ref 3.87–5.11)

## 2011-05-14 LAB — PREPARE RBC (CROSSMATCH)

## 2011-05-14 LAB — BASIC METABOLIC PANEL
CO2: 22 mEq/L (ref 19–32)
Calcium: 8.9 mg/dL (ref 8.4–10.5)
Creatinine, Ser: 0.96 mg/dL (ref 0.50–1.10)

## 2011-05-14 MED ORDER — HEPARIN SOD (PORK) LOCK FLUSH 100 UNIT/ML IV SOLN
250.0000 [IU] | INTRAVENOUS | Status: AC | PRN
  Administered 2011-05-14: 250 [IU]

## 2011-05-14 MED ORDER — AMOXICILLIN-POT CLAVULANATE 875-125 MG PO TABS: 1.0000 | ORAL_TABLET | Freq: Two times a day (BID) | ORAL | Status: DC

## 2011-05-14 MED ORDER — HYDROCODONE-ACETAMINOPHEN 10-325 MG PO TABS: 2.0000 | ORAL_TABLET | ORAL | Status: DC | PRN

## 2011-05-14 MED ORDER — VANCOMYCIN HCL 500 MG IV SOLR: 1000.0000 mg | INTRAVENOUS | Status: AC

## 2011-05-14 MED ORDER — VANCOMYCIN HCL 500 MG IV SOLR: 500.0000 mg | Freq: Two times a day (BID) | INTRAVENOUS | Status: DC

## 2011-05-14 NOTE — Progress Notes (Signed)
Orthopedic Tech Progress Note Patient Details:  [name] Aug 10, 1970 [id number]  Other Ortho Devices Type of Ortho Device: Crutches;CAM walker Ortho Device Location: left foot Ortho Device Interventions: Application   Hildred Priest 05/14/2011, 6:47 PM

## 2011-05-14 NOTE — Progress Notes (Signed)
VASCULAR LAB PRELIMINARY  PRELIMINARY  PRELIMINARY  PRELIMINARY  Left lower extremity venous Doppler completed.    Preliminary report:  No DVT or SVT noted in the left lower extremity.  Sharion Dove [place], 05/14/2011, 9:01 AM

## 2011-05-14 NOTE — Consult Note (Signed)
WOC consult Note Reason for Consult: Consult requested for left foot wound. Wound type: Full thickness wound .2X,2X.2cm with 6X6 cm area of erythremia to plantar foot surrounding.  Anterior foot also with generalized edema and erythremia. Wound bed: Inner wound bed red, minimal drainage, no odor. Periwound: Appears to be blister which has ruptured, undermining to 1 cm around open wound. Dressing procedure/placement/frequency: According to MRI. 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  This is beyond [facility] scope of practice.  Primary team has discussed plan of care with ortho team according to progress notes.   Plan: Silver wound gel to provide antimicrobial benefits and  moist healing.  If no signs of improvement, recommend consult by ortho service. Will not plan to follow further unless re-consulted.  [address], [place], MSN, [place]

## 2011-05-14 NOTE — Progress Notes (Signed)
Inpatient Diabetes Program Recommendations  AACE/ADA: New Consensus Statement on Inpatient Glycemic Control (2009)  Target Ranges:  Prepandial:   less than 140 mg/dL      Peak postprandial:   less than 180 mg/dL (1-2 hours)      Critically ill patients:  140 - 180 mg/dL   Reason for Visit: Consult regarding uncontrolled diabetes, and provide resources  Patient experiencing a nose-bleed at the time of my visit.  Patient gets her insulin, meter supplies, etc with Medicaid.  Just recently started seeing Dr. Dwyane Dee.  Patient able to recite to me what she "ideally" should do to manage her diabetes-- check CBG's, take insulin, etc.  States that the reality is that she checks her CBG in the mornings about 80% of the time-not as specific about the other times during the day.  She seems to either check her CBG or take her insulin-- but doesn't regularly manage to do it all.  States that if she does check CBG's and take her insulin as she is supposed to do, she vomits, and that causes another set of problems.  States that her own management of diabetes has gotten her into "this mess".  Encouraged her to follow-up with Dr. Dwyane Dee and his Nurse Educator as they will try to help her achieve better control.  States Dr. Dwyane Dee is suggesting an insulin pump-- which she is reluctant to do.  (Can't stand thoughts of needle being connected all the time.)  Cautioned her that if she uses an insulin pump she must check CBG's regularly for safety.  States she rents 3 rooms in her house and works to help cover expenses.  Now one of her rooms is vacant and she can't show it to rent it because she is in the hospital, one renter isn't paying as she should, and she can't work because she is here, etc.  Diabetes and life situations seem to be overwhelming her.  Perhaps counseling would be helpful.  Note CBG's in 300's today.  Did not get Lantus 15 units last night as ordered, but did get this morning's dose.

## 2011-05-14 NOTE — Discharge Summary (Signed)
Patient ID: BRIAUNA DARGENIO MRN: [id number] DOB/AGE: [birth year] 41 y.o.  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      ferrous fumarate 325 (106 FE) MG Tabs   Commonly known as: HEMOCYTE - 106 mg FE   Take 1 tablet by mouth.      HYDROcodone-acetaminophen 10-325 MG per tablet   Commonly known as: NORCO   Take 1 tablet by mouth every 4 (four) hours as needed.      insulin glargine 100 UNIT/ML injection   Commonly known as: LANTUS   Inject 10-15 Units into the skin 2 (two) times daily.      insulin lispro 100 UNIT/ML injection   Commonly known as: HUMALOG   Inject 5-7 Units into the skin 3 (three) times daily before meals.      lisinopril 2.5 MG tablet   Commonly known as: PRINIVIL,ZESTRIL   Take 2.5 mg by mouth daily.      pravastatin 20 MG tablet   Commonly known as: PRAVACHOL   Take 20 mg by mouth daily.      sodium chloride 0.9 % SOLN 100 mL with vancomycin 500 MG  SOLR 1,000 mg   Inject 1,000 mg into the vein daily.            Disposition and Follow-up: with PCP in 2 weeks. Will need CBC checked to ensure hemoglobin is at baseline.  Consults:  ortho  Significant Diagnostic Studies:  Mr Foot Left W Wo Contrast  05/10/2011  *RADIOLOGY REPORT*  Clinical Data: Cellulitis with pain, swelling, redness, and soft tissue ulceration on the sole of the foot.  MRI OF THE LEFT FOREFOOT WITHOUT AND WITH CONTRAST  Technique:  Multiplanar, multisequence MR imaging was performed both before and after administration of intravenous contrast.  Contrast: 60mL MULTIHANCE GADOBENATE DIMEGLUMINE 529 MG/ML IV SOLN  Comparison: Radiographs dated 05/09/2011  Findings: There is a soft tissue ulceration on the ball of the foot just superficial to the head of the first metatarsal and the sesamoids adjacent to the flexor tendon of the great toe.  The patient has abnormal edema in the medial and lateral sesamoids and abnormal enhancement of both sesamoids after contrast administration.  There are mild arthritic changes of the base of the proximal phalangeal bone of the great toe.  There are no joint effusions and there is no evidence of osteomyelitis of the phalangeal bones or of the metatarsals.  No discrete soft tissue abscess.  IMPRESSION:  Abnormal edema and enhancement of the medial and lateral sesamoids adjacent to the head of the first metatarsal.  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  No other evidence of osteomyelitis.  No soft tissue abscesses.  The soft tissue ulceration is immediately superficial to the sesamoids. The adjacent flexor tendon appears normal.  Original Report Authenticated By: Larey Seat, M.D.   Dg Foot Complete Left  05/09/2011  *RADIOLOGY REPORT*  Clinical Data: Ulceration, diabetes  LEFT FOOT - COMPLETE 3+ VIEW  Comparison: 04/19/2011  Findings: Diffuse osteopenia noted with peripheral calcific atherosclerosis.  Normal alignment.  No  displaced fracture.  No radiopaque foreign body.  No bone destruction or significant arthropathy.  Stable exam.  IMPRESSION: Osteopenia and peripheral atherosclerosis.  No acute osseous abnormality.  Original Report Authenticated By: Jerilynn Mages. Daryll Brod, M.D.    Brief H and P: 41 year old woman with lifelong history of diabetes mellitus type 1, complicated by peripheral neuropathy, nephropathy and retinopathy presented to emergency room today with worsening left foot pain edema and erythema in and around an area of a chronic diabetic Foot ulcer. The patient also has been vomiting the antibiotic prescribed by her outpatient physician. She has been under the care of Dr. Ila Mcgill.   Physical Exam on Discharge:  Filed Vitals:   05/14/11 1510 05/14/11 1533 05/14/11 1633 05/14/11 1733  BP: 136/77 128/85 150/96 147/94  Pulse: 90 89 87 86  Temp: 99 F (37.2 C) 98.7 F (37.1 C) 98.9 F (37.2 C) 99.5 F (37.5 C)  TempSrc: Oral Oral Oral Oral  Resp: 20 20 20 20   Height:      Weight:      SpO2:         Intake/Output Summary (Last 24 hours) at 05/14/11 1753 Last data filed at 05/14/11 1733  Gross per 24 hour  Intake    950 ml  Output      0 ml  Net    950 ml    General: Alert, awake, oriented x3, in no acute distress. HEENT: No bruits, no goiter. Heart: Regular rate and rhythm, without murmurs, rubs, gallops. Lungs: Clear to auscultation bilaterally. Abdomen: Soft, nontender, nondistended, positive bowel sounds. Extremities: No clubbing cyanosis or edema with positive pedal pulses. Foot ulcer on the bottom of the left foot, improving and erythema improving as well. Neuro: Grossly intact, nonfocal.  CBC:    Component Value Date/Time   WBC 8.6 05/14/2011 0600   HGB 6.7* 05/14/2011 0600   HCT 21.8* 05/14/2011 0600   PLT 422* 05/14/2011 0600   MCV 69.6* 05/14/2011 0600   NEUTROABS 5.9 11/16/2010 0204   LYMPHSABS 1.9 11/16/2010 0204   MONOABS 0.5 11/16/2010 0204   EOSABS 0.2 11/16/2010 0204     BASOSABS 0.0 11/16/2010 0000000    Basic Metabolic Panel:    Component Value Date/Time   NA 137 05/13/2011 2230   K 4.2 05/13/2011 2230   CL 108 05/13/2011 2230   CO2 22 05/13/2011 2230   BUN 19 05/13/2011 2230   CREATININE 0.96 05/13/2011 2230   GLUCOSE 155* 05/13/2011 2230   CALCIUM 8.9 05/13/2011 2230   CALCIUM 8.5 09/01/2009 2241    Hospital Course:  Principal Problem:  *Cellulitis and abscess of foot - 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 Paulla Dolly of Podiatry decide on duration based on clinical picture - orthopedic specialist did not recommend surgery  at this time but agrees with antibiotics for 4-6 week with follow and re-evaluation - pt did not want to be discharged on vancomycin and zosyn as she said the treatment was to complicated and she would be noncompliant - we have arranged to discharge her onvancomycin 1 gram daily and discontinue Zosyn with change to Augmentin as recommended per pharmacy and ID team  Active Problems:  Iron deficiency anemia, unspecified - pt has required 1 unit PRBC transfusion and has refused 2nd unit - will need CBC follow up  Please note: pt was advised to stay in hospital for further evaluation of anemia but refuses further care and evaluation as she wants to leave home today. Also refuses vanc and zosyn due to complicated treatment and has no time for the infusion I have simplified the regimen with vancomycin once daily and augmentin PO BID I have arranged follow up with Dr. Paulla Dolly this week  Time spent on Discharge: Over 30 minutes  Signed: Faye Ramsay 05/14/2011, 5:53 PM  [phone]

## 2011-05-15 LAB — TYPE AND SCREEN
ABO/RH(D): AB NEG
Antibody Screen: POSITIVE

## 2011-05-15 NOTE — Progress Notes (Signed)
05/15/2011 [place], [place] Case Management Note [phone]  Utilization review completed.

## 2011-05-15 NOTE — Progress Notes (Signed)
   CARE MANAGEMENT NOTE 05/15/2011  Patient:  [name], [name]   Account Number:  [id number]  Date Initiated:  05/15/2011  Documentation initiated by:  [facility]  Subjective/Objective Assessment:   Admitted with cellulitis of foot     Action/Plan:   home with IV antibiotics   Anticipated DC Date:  05/14/2011   Anticipated DC Plan:  [place]  CM consult      Choice offered to / List presented to:  C-1 Patient        [place] arranged  HH-1 RN      [place].   Status of service:  Completed, signed off Medicare Important Message given?   (If response is "NO", the following Medicare IM given date fields will be blank) Date Medicare IM given:   Date Additional Medicare IM given:    Discharge Disposition:  [place]  Per UR Regulation:  Reviewed for med. necessity/level of care/duration of stay  Comments:  PCP Dr. Delman Cheadle  05/15/11 Spoke with patient on 05/14/11 regarding [place] for Iv antibiotics.She chose [facility] list. Alfonzo Beers at [facility] and requested [facility] and home IV antibiotics. Clover Mealy RN with [facility] called and stated that she had spoken with the patient about home IV antibiotics IV vancomycin BID and zosyn TID and patient is stating that she can not be compliant with IV antibiotics 5 times per day. Conatcted Dr. Doyle Askew and informed her of patient stating that she can not be compliant with IV antibiotics 5 times per day. Dr. Doyle Askew statd that she would speak with the patient and if needed change the antibiotics. Dr. Doyle Askew changed IV antibiotics to vancomycin 1gm daily.  Received call from Clark at [place] and oders for [facility] and vancomycin appear to be cancelled. Called Dr. Doyle Askew and she will reenter the order for vancomycin and [facility] with face to face.Contacted Debbie at [place] will call the patient and plan to see her this evening. Fuller Plan RN, BSN, CCM

## 2011-05-24 ENCOUNTER — Ambulatory Visit (INDEPENDENT_AMBULATORY_CARE_PROVIDER_SITE_OTHER): Payer: Medicaid Other | Admitting: Internal Medicine

## 2011-05-24 ENCOUNTER — Encounter: Payer: Self-pay | Admitting: Internal Medicine

## 2011-05-24 VITALS — BP 157/89 | HR 78 | Temp 97.6°F | Ht 67.5 in | Wt 139.4 lb

## 2011-05-24 DIAGNOSIS — L02619 Cutaneous abscess of unspecified foot: Secondary | ICD-10-CM

## 2011-05-24 LAB — COMPREHENSIVE METABOLIC PANEL
Albumin: 3.1 g/dL — ABNORMAL LOW (ref 3.5–5.2)
BUN: 15 mg/dL (ref 6–23)
CO2: 24 mEq/L (ref 19–32)
Calcium: 9 mg/dL (ref 8.4–10.5)
Chloride: 104 mEq/L (ref 96–112)
Glucose, Bld: 135 mg/dL — ABNORMAL HIGH (ref 70–99)
Potassium: 4.6 mEq/L (ref 3.5–5.3)
Sodium: 139 mEq/L (ref 135–145)
Total Protein: 6.5 g/dL (ref 6.0–8.3)

## 2011-05-24 LAB — SEDIMENTATION RATE: Sed Rate: 55 mm/hr — ABNORMAL HIGH (ref 0–22)

## 2011-05-24 LAB — CBC
HCT: 27.2 % — ABNORMAL LOW (ref 36.0–46.0)
Hemoglobin: 8 g/dL — ABNORMAL LOW (ref 12.0–15.0)
RBC: 3.74 MIL/uL — ABNORMAL LOW (ref 3.87–5.11)

## 2011-05-24 LAB — C-REACTIVE PROTEIN: CRP: 1.14 mg/dL — ABNORMAL HIGH (ref <0.60)

## 2011-05-24 NOTE — Progress Notes (Addendum)
Patient ID: [name], female   DOB: [birth year], 41 y.o.   MRN: [id number]  INFECTIOUS DISEASE PROGRESS NOTE    Subjective: [name] is a 41 year old female with long-standing diabetes complicated by peripheral neuropathy who is referred to me by Dr. Elease Hashimoto for evaluation of a left foot infection. She has had a small plantar ulcer on her left foot for the past several months. In January she began to notice some redness and swelling and was treated with about one month of oral antibiotic therapy. I believe she was taking Septra and then ciprofloxacin. Apparently she did not show any significant improvement and she was hospitalized from February 6 to February 11. She was treated empirically with IV vancomycin and piperacillin tazobactam. 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 No specimens were obtained for culture. She was discharged home on IV vancomycin and oral Augmentin but has continued to have redness and swelling and was put back on piperacillin tazobactam yesterday. She has had no problems tolerating her PICC or IV antibiotics. She has not had any fever, chills or sweats.  She states that generally her diabetes has been very poorly controlled. I note that her recent hemoglobin A1c was 11.9. She states that she does not use her Lantus insulin on a regular basis because she does not feel that it works well. She states that she's been trying to do better managing her diabetes since she left the hospital but is generally using only her Humalog insulin. He tells me that her blood sugar this morning was 400. She lives with 3 roommates and her 58 year old son. She states that she has been trying very hard to stay off of her left foot.  Objective: Temp: 97.6 F (36.4 C) (02/21 0942) Temp src: Oral (02/21 0942) BP: 157/89 mmHg (02/21 0942) Pulse Rate: 78  (02/21 0942)  General: She is talkative and in good spirits Skin: Her right arm PICC  site appears normal Lungs: Clear Cor: Regular S1 and S2 no murmurs Abdomen: Soft and nontender Extremities: She has 1+ pitting edema of both ankles. Her left foot is edematous with erythema and peeling skin on the plantar surface. She has about a 3 mm superficial ulcer under the first metatarsal head. There is yellow drainage on the gauze dressing but no expressible drainage on exam. Her foot is warm and well perfused.  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 I agree with reinstitution of the piperacillin tazobactam along with vancomycin. I will repeat her inflammatory markers and see her back in one week. If she is not improving she will probably need incision and drainage soon.  I've encouraged her to work very hard on her glycemic control. She states that she has an upcoming visit with her diabetologist, Dr. Dwyane Dee, within the next week.  Plan: 1. Continue vancomycin and piperacillin tazobactam 2. Check repeat sedimentation rate and C-reactive protein 3. Followup within one week   Michel Bickers, MD [facility] for [place] [phone] pager   [phone] cell 05/24/2011, 10:10 AM

## 2011-05-30 ENCOUNTER — Ambulatory Visit: Payer: Medicaid Other | Admitting: Internal Medicine

## 2011-06-02 ENCOUNTER — Emergency Department (INDEPENDENT_AMBULATORY_CARE_PROVIDER_SITE_OTHER): Payer: Medicaid Other

## 2011-06-02 ENCOUNTER — Emergency Department (HOSPITAL_BASED_OUTPATIENT_CLINIC_OR_DEPARTMENT_OTHER)
Admission: EM | Admit: 2011-06-02 | Discharge: 2011-06-02 | Disposition: A | Discharge status: Home / Self Care | Payer: Medicaid Other | Source: Home / Self Care | Attending: Emergency Medicine | Admitting: Emergency Medicine

## 2011-06-02 ENCOUNTER — Encounter (HOSPITAL_BASED_OUTPATIENT_CLINIC_OR_DEPARTMENT_OTHER): Payer: Self-pay | Admitting: Emergency Medicine

## 2011-06-02 DIAGNOSIS — T82898A Other specified complication of vascular prosthetic devices, implants and grafts, initial encounter: Secondary | ICD-10-CM

## 2011-06-02 DIAGNOSIS — Y849 Medical procedure, unspecified as the cause of abnormal reaction of the patient, or of later complication, without mention of misadventure at the time of the procedure: Secondary | ICD-10-CM | POA: Insufficient documentation

## 2011-06-02 DIAGNOSIS — R918 Other nonspecific abnormal finding of lung field: Secondary | ICD-10-CM

## 2011-06-02 DIAGNOSIS — Z79899 Other long term (current) drug therapy: Secondary | ICD-10-CM | POA: Insufficient documentation

## 2011-06-02 DIAGNOSIS — R609 Edema, unspecified: Secondary | ICD-10-CM | POA: Insufficient documentation

## 2011-06-02 DIAGNOSIS — E119 Type 2 diabetes mellitus without complications: Secondary | ICD-10-CM

## 2011-06-02 DIAGNOSIS — I10 Essential (primary) hypertension: Secondary | ICD-10-CM | POA: Insufficient documentation

## 2011-06-02 DIAGNOSIS — M79609 Pain in unspecified limb: Secondary | ICD-10-CM | POA: Insufficient documentation

## 2011-06-02 DIAGNOSIS — Y92009 Unspecified place in unspecified non-institutional (private) residence as the place of occurrence of the external cause: Secondary | ICD-10-CM | POA: Insufficient documentation

## 2011-06-02 DIAGNOSIS — I517 Cardiomegaly: Secondary | ICD-10-CM

## 2011-06-02 DIAGNOSIS — E7800 Pure hypercholesterolemia, unspecified: Secondary | ICD-10-CM | POA: Insufficient documentation

## 2011-06-02 HISTORY — DX: Local infection of the skin and subcutaneous tissue, unspecified: L08.9

## 2011-06-02 MED ORDER — HEPARIN SOD (PORK) LOCK FLUSH 100 UNIT/ML IV SOLN
INTRAVENOUS | Status: AC
  Administered 2011-06-02: 250 [IU]
  Filled 2011-06-02: qty 5

## 2011-06-02 MED ORDER — HEPARIN (PORCINE) IN NACL 100-0.45 UNIT/ML-% IJ SOLN: 250.0000 [IU]/h | Freq: Once | INTRAMUSCULAR | Status: AC

## 2011-06-02 MED ORDER — SODIUM CHLORIDE 0.9 % IJ SOLN
30.0000 mL | Freq: Once | INTRAMUSCULAR | Status: AC
  Administered 2011-06-02: 30 mL via INTRAVENOUS
  Filled 2011-06-02: qty 30

## 2011-06-02 NOTE — ED Notes (Signed)
Picc line unable to be accessed since last night.  Pt states it is clogged and she is on two antibiotics.  Last doses yesterday.

## 2011-06-02 NOTE — ED Provider Notes (Signed)
History     CSN: [id number]  Arrival date & time 06/02/11  1218   First MD Initiated Contact with Patient 06/02/11 1308      Chief Complaint  Patient presents with  . Vascular Access Problem    (Consider location/radiation/quality/duration/timing/severity/associated sxs/prior treatment) HPI Patient is a 41 year old female with history of diabetes who presents today due to problems with her right upper extremity PICC line.  The patient said she had been hanging her IV antibiotics on a cupboard door while she was cooking in the kitchen and that following this she noted backup of blood into the line. She has been able to flush the line but not able to push her IV antibiotics. Patient is currently on vancomycin and Zosyn for foot ulcer. Patient denies any right arm pain. She does not feel that the catheter is out any increased distance since the event. She denies any other symptoms today. She just wants to get the line cleared. There are no other associated or modifying factors Past Medical History  Diagnosis Date  . Diabetes mellitus   . Neuropathy in diabetes   . Hypertension   . Hypercholesteremia   . H/O seasonal allergies   . Anemia   . Left foot infection     Past Surgical History  Procedure Date  . Cesarean section   . Eye surgery   . Hemrrhoidectomy   . Peripherally inserted central catheter insertion     History reviewed. No pertinent family history.  History  Substance Use Topics  . Smoking status: Former Smoker    Quit date: 05/16/2008  . Smokeless tobacco: Not on file  . Alcohol Use: 0.5 oz/week    1 drink(s) per week     Occasional    OB History    Grav Para Term Preterm Abortions TAB SAB Ect Mult Living                  Review of Systems  Constitutional: Negative.   HENT: Negative.   Eyes: Negative.   Respiratory: Negative.   Cardiovascular: Negative.   Gastrointestinal: Negative.   Genitourinary: Negative.   Musculoskeletal: Negative.    Heel pain as previously  Skin: Positive for wound.  Neurological: Negative.   Hematological: Negative.   Psychiatric/Behavioral: Negative.   All other systems reviewed and are negative.    Allergies  Review of patient's allergies indicates no known allergies.  Home Medications   Current Outpatient Rx  Name Route Sig Dispense Refill  . FERROUS FUMARATE 325 (106 FE) MG PO TABS Oral Take 1 tablet by mouth.    . INSULIN GLARGINE 100 UNIT/ML [place] SOLN Subcutaneous Inject 10-15 Units into the skin 2 (two) times daily.     . INSULIN LISPRO (HUMAN) 100 UNIT/ML [place] SOLN Subcutaneous Inject 5-7 Units into the skin 3 (three) times daily before meals.     Marland Kitchen LISINOPRIL 2.5 MG PO TABS Oral Take 2.5 mg by mouth daily.    Marland Kitchen ZOSYN IV Intravenous Inject into the vein.    Marland Kitchen PRAVASTATIN SODIUM 20 MG PO TABS Oral Take 20 mg by mouth daily.    Marland Kitchen VANCOMYCIN 500 MG IVPB Intravenous Inject 1,000 mg into the vein daily. 1 application 5    BP [id number]  Pulse 87  Temp(Src) 98.3 F (36.8 C) (Oral)  Resp 24  Ht 5\' 8"  (1.727 m)  Wt 138 lb (62.596 kg)  BMI 20.98 kg/m2  SpO2 98%  LMP 05/03/2011  Physical Exam  Nursing note and vitals  reviewed. GEN: Well-developed, well-nourished female in no distress HEENT: Atraumatic, normocephalic. Oropharynx clear without erythema EYES: PERRLA BL, no scleral icterus. NECK: Trachea midline, no meningismus CV: regular rate and rhythm. No murmurs, rubs, or gallops PULM: No respiratory distress.  No crackles, wheezes, or rales. GI: soft, non-tender. No guarding, rebound, or tenderness. + bowel sounds  Neuro: cranial nerves 2-12 intact, no abnormalities of strength or sensation, A and O x 3 MSK: Patient moves all 4 extremities symmetrically, no deformity, bilateral lower extremity edema the patient reports is at baseline has 1+ pitting. Right upper extremity PICC line with no surrounding erythema or edema. No tender spot patient's rounding PICC line site. Patient was bandage  over her left foot at site of left heel ulcer.  Psych: no abnormality of mood   ED Course  Procedures (including critical care time)  Labs Reviewed - No data to display Dg Chest 1 View  06/02/2011  *RADIOLOGY REPORT*  Clinical Data: PICC line unable to be accessed last night.  History of diabetes and hypertension.  CHEST - 1 VIEW  Comparison: 12/14/2007  Findings: Right PICC line tip overlies the level of the superior vena cava.  Heart is enlarged.  There are patchy densities in the lung bases bilaterally, right greater than left and consistent with infectious infiltrate or interstitial edema.  Suspect small right pleural effusion.  No evidence for pneumothorax.  IMPRESSION:  1.  Cardiomegaly and bilateral infiltrates, right greater than left. 2.  Right-sided PICC line.  Original Report Authenticated By: Glenice Bow, M.D.     1. Occluded PICC line       MDM  Patient was evaluated by myself. Based on history patient did have a plain film performed to ensure that there was no displacement of the patient's PICC line. This was remarkable for findings consistent with an interstitial edema versus infiltrate. I spoke with radiology and they agreed that given patient's history in that she had no abnormal vital signs or history of shortness of breath or cough that this most likely seemed to be edema. Patient reported that her peripheral edema is at baseline. Patient had her line flushed here and was able to use her line. She was discharged and will restart her vancomycin and Zosyn. Patient was notified of need to followup with her primary care physician regarding the change in her chest x-ray between today and 2009. There are no other associated or modifying factors. Further workup was not necessary today given patient's clinical presentation.        Chauncy Passy, MD 06/02/11 [phone]

## 2011-06-06 ENCOUNTER — Ambulatory Visit: Payer: Medicaid Other | Admitting: Infectious Disease

## 2011-06-07 ENCOUNTER — Ambulatory Visit: Payer: Medicaid Other | Admitting: Internal Medicine

## 2011-06-08 ENCOUNTER — Encounter (HOSPITAL_BASED_OUTPATIENT_CLINIC_OR_DEPARTMENT_OTHER): Payer: Self-pay | Admitting: *Deleted

## 2011-06-08 ENCOUNTER — Emergency Department (HOSPITAL_BASED_OUTPATIENT_CLINIC_OR_DEPARTMENT_OTHER)
Admission: EM | Admit: 2011-06-08 | Discharge: 2011-06-08 | Disposition: A | Discharge status: Home / Self Care | Payer: Medicaid Other | Attending: Emergency Medicine | Admitting: Emergency Medicine

## 2011-06-08 DIAGNOSIS — E1149 Type 2 diabetes mellitus with other diabetic neurological complication: Secondary | ICD-10-CM | POA: Insufficient documentation

## 2011-06-08 DIAGNOSIS — E1142 Type 2 diabetes mellitus with diabetic polyneuropathy: Secondary | ICD-10-CM | POA: Insufficient documentation

## 2011-06-08 DIAGNOSIS — I10 Essential (primary) hypertension: Secondary | ICD-10-CM | POA: Insufficient documentation

## 2011-06-08 DIAGNOSIS — Z87891 Personal history of nicotine dependence: Secondary | ICD-10-CM | POA: Insufficient documentation

## 2011-06-08 DIAGNOSIS — Z794 Long term (current) use of insulin: Secondary | ICD-10-CM | POA: Insufficient documentation

## 2011-06-08 DIAGNOSIS — Z7982 Long term (current) use of aspirin: Secondary | ICD-10-CM | POA: Insufficient documentation

## 2011-06-08 DIAGNOSIS — D649 Anemia, unspecified: Secondary | ICD-10-CM | POA: Insufficient documentation

## 2011-06-08 DIAGNOSIS — E7800 Pure hypercholesterolemia, unspecified: Secondary | ICD-10-CM | POA: Insufficient documentation

## 2011-06-08 DIAGNOSIS — Z79899 Other long term (current) drug therapy: Secondary | ICD-10-CM | POA: Insufficient documentation

## 2011-06-08 DIAGNOSIS — R040 Epistaxis: Secondary | ICD-10-CM

## 2011-06-08 NOTE — ED Notes (Signed)
Rapid rhino in place, no bleeding noted at this time.

## 2011-06-08 NOTE — ED Notes (Signed)
Pt c/o nosebleed since 3pm on Thursday

## 2011-06-08 NOTE — ED Provider Notes (Signed)
History     CSN: [id number]  Arrival date & time 06/08/11  0111   First MD Initiated Contact with Patient 06/08/11 0124      Chief Complaint  Patient presents with  . Epistaxis    (Consider location/radiation/quality/duration/timing/severity/associated sxs/prior treatment) Patient is a 41 y.o. female presenting with nosebleeds. The history is provided by the patient.  Epistaxis    the patient is a 41 year old, female, with a history of diabetes, hypertension, and hypercholesterolemia.  She states that her nose.  Has been bleeding intermittently since approximately 3 PM yesterday.  She is not lightheaded or shortness of breath.  She takes aspirin.  She does not take any other anticoagulants.  She has no other complaints.  Past Medical History  Diagnosis Date  . Diabetes mellitus   . Neuropathy in diabetes   . Hypertension   . Hypercholesteremia   . H/O seasonal allergies   . Anemia   . Left foot infection     Past Surgical History  Procedure Date  . Cesarean section   . Eye surgery   . Hemrrhoidectomy   . Peripherally inserted central catheter insertion     History reviewed. No pertinent family history.  History  Substance Use Topics  . Smoking status: Former Smoker    Quit date: 05/16/2008  . Smokeless tobacco: Not on file  . Alcohol Use: 0.5 oz/week    1 drink(s) per week     Occasional    OB History    Grav Para Term Preterm Abortions TAB SAB Ect Mult Living                  Review of Systems  Constitutional: Negative for fever.  HENT: Positive for nosebleeds.   Neurological: Negative for dizziness, weakness and light-headedness.  All other systems reviewed and are negative.    Allergies  Review of patient's allergies indicates no known allergies.  Home Medications   Current Outpatient Rx  Name Route Sig Dispense Refill  . FERROUS FUMARATE 325 (106 FE) MG PO TABS Oral Take 1 tablet by mouth.    . INSULIN GLARGINE 100 UNIT/ML [place] SOLN  Subcutaneous Inject 10-15 Units into the skin 2 (two) times daily.     . INSULIN LISPRO (HUMAN) 100 UNIT/ML [place] SOLN Subcutaneous Inject 5-7 Units into the skin 3 (three) times daily before meals.     Marland Kitchen LISINOPRIL 2.5 MG PO TABS Oral Take 2.5 mg by mouth daily.    Marland Kitchen ZOSYN IV Intravenous Inject into the vein.    Marland Kitchen PRAVASTATIN SODIUM 20 MG PO TABS Oral Take 20 mg by mouth daily.    Marland Kitchen VANCOMYCIN 500 MG IVPB Intravenous Inject 1,000 mg into the vein daily. 1 application 5    BP [id number]  Pulse 83  Temp(Src) 97.5 F (36.4 C) (Oral)  Resp 18  SpO2 99%  LMP 05/02/2011  Physical Exam  Constitutional: She is oriented to person, place, and time. She appears well-developed and well-nourished.  HENT:  Head: Normocephalic and atraumatic.  Mouth/Throat: Oropharynx is clear and moist.       Patient ruled out large clot from her nose.  She has small amount of bright red blood coming from both nares.   Eyes: Conjunctivae are normal. Pupils are equal, round, and reactive to light.  Neck: Normal range of motion. Neck supple.  Pulmonary/Chest: Effort normal.  Abdominal: She exhibits no distension.  Musculoskeletal: Normal range of motion.  Neurological: She is alert and oriented to person, place, and  time.  Skin: Skin is warm and dry.  Psychiatric: She has a normal mood and affect.    ED Course  Procedures (including critical care time) Epistaxis.  Large clot, 1 out from her nose.  I will observe her and indicated, place, Rhino Rocket to stop the bleeding.  There is no indication of large-volume bleed, and no blood testing is indicated at this time.  Labs Reviewed - No data to display No results found.   No diagnosis found.  Pt continued to bleed from right nare after observation > 30 min.   Therefore, I placed rhinorocket without complication.   2:52 AM Bleeding stopped. MDM  epistaxis        Elmer Picker, MD 06/08/11 [phone]

## 2011-06-08 NOTE — ED Notes (Signed)
MD at bedside. 

## 2011-06-08 NOTE — Discharge Instructions (Signed)
Followup with your doctor in 2 days for removal of the [employer].  Return for worse or uncontrolled symptoms

## 2011-06-08 NOTE — ED Notes (Signed)
Pt's nose continues to bleed, MD made aware.

## 2011-06-11 ENCOUNTER — Encounter: Payer: Self-pay | Admitting: Infectious Disease

## 2011-06-11 ENCOUNTER — Ambulatory Visit (INDEPENDENT_AMBULATORY_CARE_PROVIDER_SITE_OTHER): Payer: Medicaid Other | Admitting: Infectious Disease

## 2011-06-11 ENCOUNTER — Telehealth: Payer: Self-pay | Admitting: *Deleted

## 2011-06-11 VITALS — BP 181/95 | HR 79 | Temp 97.4°F | Ht 67.0 in | Wt 131.2 lb

## 2011-06-11 DIAGNOSIS — L97509 Non-pressure chronic ulcer of other part of unspecified foot with unspecified severity: Secondary | ICD-10-CM

## 2011-06-11 DIAGNOSIS — E13621 Other specified diabetes mellitus with foot ulcer: Secondary | ICD-10-CM

## 2011-06-11 DIAGNOSIS — E1369 Other specified diabetes mellitus with other specified complication: Secondary | ICD-10-CM

## 2011-06-11 DIAGNOSIS — I10 Essential (primary) hypertension: Secondary | ICD-10-CM

## 2011-06-11 NOTE — Progress Notes (Signed)
Order to remove PICC line obtained from Dr. Tommy Medal. Pt. identified with name and date of birth. PICC dressing removed, site unremarkable. PICC line removed using sterile procedure @ 1030 pm. PICC length equal to that noted in pt's hospital chart of383 cm. Sterile petroleum gauze + sterile 4X4 applied to PICC site, pressure applied for 10 minutes and covered with Medipore tape as a pressure dressing. Pt. instructed to limit use of arm for 1 hour. Pt. instructed that the pressure dressing should remain in place for 24 hours. Pt. verablized understanding of these instructions.

## 2011-06-11 NOTE — Telephone Encounter (Signed)
Brook, with [facility] called to see if she should pull the picc. Pt is done with her med. I see that she has no showed for 2 appts. I asked the nurse to have the pt call asap to make & keep an appt

## 2011-06-11 NOTE — Assessment & Plan Note (Signed)
Pt has had dramatic improvement in appearance of her ulcer now after protracted IV antibiotics. She does not appear to have been receiving antipseudmonal dosing of pip'/tazo only receiving the abx tid and not with extended infusion. I am comfortable taking her off antibiotics at this point in time and having her rtc prn. She has close followup with her PCP and her podiatrist

## 2011-06-11 NOTE — Assessment & Plan Note (Signed)
Poorly controlled. Defer to PCP

## 2011-06-11 NOTE — Progress Notes (Signed)
Subjective:    Patient ID: [name], female    DOB: 30-Aug-1970, 41 y.o.   MRN: [id number]  HPI  41 year old female with long-standing diabetes complicated by peripheral neuropathy who is referred to Dr Megan Salon, my ID partner, by  Dr. Elease Hashimoto for evaluation of a left foot infection. She has had a small plantar ulcer on her left foot for the past several months. In January she began to notice some redness and swelling and was treated with about one month of oral antibiotic therapy. I believe she was taking Septra and then ciprofloxacin. Apparently she did not show any significant improvement and she was hospitalized from February 6 to February 11. She was treated empirically with IV vancomycin and piperacillin tazobactam. 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 No specimens were obtained for culture. She was discharged home on IV vancomycin and oral Augmentin but has continued to have redness and swelling and was put back on piperacillin tazobactam  On 05/23/2011. She has continued on these drugs thru today. Since this switch she has had tremendous improvement in the drainage, and erythema and pain. She does have some newer pain in her ankle but believes this is because she is "wakling on it funny."   Review of Systems  Constitutional: Negative for fever, chills, diaphoresis, activity change, appetite change, fatigue and unexpected weight change.  HENT: Negative for congestion, sore throat, rhinorrhea, sneezing, trouble swallowing and sinus pressure.   Eyes: Negative for photophobia and visual disturbance.  Respiratory: Negative for cough, chest tightness, shortness of breath, wheezing and stridor.   Cardiovascular: Negative for chest pain, palpitations and leg swelling.  Gastrointestinal: Negative for nausea, vomiting, abdominal pain, diarrhea, constipation, blood in stool, abdominal distention and anal bleeding.  Genitourinary: Negative for  dysuria, hematuria, flank pain and difficulty urinating.  Musculoskeletal: Negative for myalgias, back pain, joint swelling, arthralgias and gait problem.  Skin: Positive for wound. Negative for color change, pallor and rash.  Neurological: Negative for dizziness, tremors, weakness and light-headedness.  Hematological: Negative for adenopathy. Does not bruise/bleed easily.  Psychiatric/Behavioral: Negative for behavioral problems, confusion, sleep disturbance, dysphoric mood, decreased concentration and agitation.       Objective:   Physical Exam  Constitutional: She is oriented to person, place, and time. She appears well-developed and well-nourished. No distress.  HENT:  Head: Normocephalic and atraumatic.  Mouth/Throat: Oropharynx is clear and moist. No oropharyngeal exudate.  Eyes: Conjunctivae and EOM are normal. Pupils are equal, round, and reactive to light. No scleral icterus.  Neck: Normal range of motion. Neck supple. No JVD present.  Cardiovascular: Normal rate, regular rhythm and normal heart sounds.  Exam reveals no gallop and no friction rub.   No murmur heard. Pulmonary/Chest: Effort normal and breath sounds normal. No respiratory distress. She has no wheezes. She has no rales. She exhibits no tenderness.  Abdominal: She exhibits no distension and no mass. There is no tenderness. There is no rebound and no guarding.  Musculoskeletal: She exhibits no edema and no tenderness.       Feet:  Lymphadenopathy:    She has no cervical adenopathy.  Neurological: She is alert and oriented to person, place, and time. She has normal reflexes. She exhibits normal muscle tone. Coordination normal.  Skin: Skin is warm and dry. She is not diaphoretic. No erythema. No pallor.     Psychiatric: She has a normal mood and affect. Her behavior is normal. Judgment and thought content normal.  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 She does not appear to have been receiving antipseudmonal dosing of pip'/tazo only receiving the abx tid and not with extended infusion. I am comfortable taking her off antibiotics at this point in time and having her rtc prn. She has close followup with her PCP and her podiatrist  Hypertension Poorly controlled. Defer to PCP

## 2011-07-02 ENCOUNTER — Emergency Department (INDEPENDENT_AMBULATORY_CARE_PROVIDER_SITE_OTHER): Payer: Medicaid Other

## 2011-07-02 ENCOUNTER — Emergency Department (HOSPITAL_BASED_OUTPATIENT_CLINIC_OR_DEPARTMENT_OTHER)
Admission: EM | Admit: 2011-07-02 | Discharge: 2011-07-02 | Disposition: A | Discharge status: Home / Self Care | Payer: Medicaid Other | Attending: Emergency Medicine | Admitting: Emergency Medicine

## 2011-07-02 ENCOUNTER — Encounter (HOSPITAL_BASED_OUTPATIENT_CLINIC_OR_DEPARTMENT_OTHER): Payer: Self-pay

## 2011-07-02 ENCOUNTER — Telehealth: Payer: Self-pay | Admitting: *Deleted

## 2011-07-02 DIAGNOSIS — M79609 Pain in unspecified limb: Secondary | ICD-10-CM

## 2011-07-02 DIAGNOSIS — M25579 Pain in unspecified ankle and joints of unspecified foot: Secondary | ICD-10-CM

## 2011-07-02 DIAGNOSIS — M7989 Other specified soft tissue disorders: Secondary | ICD-10-CM

## 2011-07-02 DIAGNOSIS — D649 Anemia, unspecified: Secondary | ICD-10-CM | POA: Insufficient documentation

## 2011-07-02 DIAGNOSIS — M25473 Effusion, unspecified ankle: Secondary | ICD-10-CM

## 2011-07-02 DIAGNOSIS — M79673 Pain in unspecified foot: Secondary | ICD-10-CM

## 2011-07-02 DIAGNOSIS — Z794 Long term (current) use of insulin: Secondary | ICD-10-CM | POA: Insufficient documentation

## 2011-07-02 DIAGNOSIS — E119 Type 2 diabetes mellitus without complications: Secondary | ICD-10-CM | POA: Insufficient documentation

## 2011-07-02 DIAGNOSIS — M25476 Effusion, unspecified foot: Secondary | ICD-10-CM | POA: Insufficient documentation

## 2011-07-02 LAB — BASIC METABOLIC PANEL
Chloride: 99 mEq/L (ref 96–112)
Creatinine, Ser: 1 mg/dL (ref 0.50–1.10)
GFR calc Af Amer: 81 mL/min — ABNORMAL LOW (ref >90)
Potassium: 5.1 mEq/L (ref 3.5–5.1)

## 2011-07-02 LAB — CBC
HCT: 30.5 % — ABNORMAL LOW (ref 36.0–46.0)
Hemoglobin: 9.7 g/dL — ABNORMAL LOW (ref 12.0–15.0)
RDW: 17.6 % — ABNORMAL HIGH (ref 11.5–15.5)
WBC: 7.6 10*3/uL (ref 4.0–10.5)

## 2011-07-02 LAB — GLUCOSE, CAPILLARY: Glucose-Capillary: 268 mg/dL — ABNORMAL HIGH (ref 70–99)

## 2011-07-02 MED ORDER — INSULIN REGULAR HUMAN 100 UNIT/ML IJ SOLN
10.0000 [IU] | Freq: Once | INTRAMUSCULAR | Status: AC
  Administered 2011-07-02: 10 [IU] via SUBCUTANEOUS

## 2011-07-02 MED ORDER — INSULIN REGULAR HUMAN 100 UNIT/ML IJ SOLN
INTRAMUSCULAR | Status: AC
  Administered 2011-07-02: 10 [IU] via SUBCUTANEOUS
  Filled 2011-07-02: qty 1

## 2011-07-02 MED ORDER — SODIUM CHLORIDE 0.9 % IV BOLUS (SEPSIS)
1000.0000 mL | Freq: Once | INTRAVENOUS | Status: AC
  Administered 2011-07-02: 1000 mL via INTRAVENOUS

## 2011-07-02 NOTE — ED Provider Notes (Signed)
Medical screening examination/treatment/procedure(s) were performed by non-physician practitioner and as supervising physician I was immediately available for consultation/collaboration.   [name]. Easton Sivertson, MD 07/02/11 2140

## 2011-07-02 NOTE — ED Notes (Signed)
Pt states that she has severe bilateral redness, itching, and she is having severe pain in her L ankle.

## 2011-07-02 NOTE — Discharge Instructions (Signed)
Anemia, Nonspecific Your exam and blood tests show you are anemic. This means your blood (hemoglobin) level is low. Normal hemoglobin values are 12 to 15 g/dL for females and 14 to 17 g/dL for males. Make a note of your hemoglobin level today. The hematocrit percent is also used to measure anemia. A normal hematocrit is 38% to 46% in females and 42% to 49% in males. Make a note of your hematocrit level today. CAUSES  Anemia can be due to many different causes.  Excessive bleeding from periods (in women).   Intestinal bleeding.   Poor nutrition.   Kidney, thyroid, liver, and bone marrow diseases.  SYMPTOMS  Anemia can come on suddenly (acute). It can also come on slowly. Symptoms can include:  Minor weakness.   Dizziness.   Palpitations.   Shortness of breath.  Symptoms may be absent until half your hemoglobin is missing if it comes on slowly. Anemia due to acute blood loss from an injury or internal bleeding may require blood transfusion if the loss is severe. Hospital care is needed if you are anemic and there is significant continual blood loss. TREATMENT   Stool tests for blood (Hemoccult) and additional lab tests are often needed. This determines the best treatment.   Further checking on your condition and your response to treatment is very important. It often takes many weeks to correct anemia.  Depending on the cause, treatment can include:  Supplements of iron.   Vitamins B12 and folic acid.   Hormone medicines.If your anemia is due to bleeding, finding the cause of the blood loss is very important. This will help avoid further problems.  SEEK IMMEDIATE MEDICAL CARE IF:   You develop fainting, extreme weakness, shortness of breath, or chest pain.   You develop heavy vaginal bleeding.   You develop bloody or black, tarry stools or vomit up blood.   You develop a high fever, rash, repeated vomiting, or dehydration.  Document Released: 04/26/2004 Document Revised:  03/08/2011 Document Reviewed: 02/01/2009 [employer] Patient Information 2012 [employer]. 

## 2011-07-02 NOTE — ED Provider Notes (Signed)
History     CSN: [id number]  Arrival date & time 07/02/11  57   First MD Initiated Contact with Patient 07/02/11 1755      Chief Complaint  Patient presents with  . Cellulitis    (Consider location/radiation/quality/duration/timing/severity/associated sxs/prior treatment) HPI Comments: Pt states that she is here because she has had increased pain and swelling to her left lower extremity for the last couple of days:pt states that she was treated for a foot infection but inpt and outpt recently and the area on her foot got better, but didn't completely resolve:pt states that she started to have pain in the left foot again and so she saw her foot doctor 4 days ago and he said it looked good but put her on antibiotics to be safe:pt states that she is supposed to see ID in 2 days:pt states that the ankle swelling has not got down and she is having redness and itching to the area:pt denies fever:pt states that she has tried multiple medications at home for pain without help  The history is provided by the patient. No language interpreter was used.    Past Medical History  Diagnosis Date  . Diabetes mellitus   . Neuropathy in diabetes   . Hypertension   . Hypercholesteremia   . H/O seasonal allergies   . Anemia   . Left foot infection     Past Surgical History  Procedure Date  . Cesarean section   . Eye surgery   . Hemrrhoidectomy   . Peripherally inserted central catheter insertion     History reviewed. No pertinent family history.  History  Substance Use Topics  . Smoking status: Former Smoker    Quit date: 05/16/2008  . Smokeless tobacco: Not on file  . Alcohol Use: 0.5 oz/week    1 drink(s) per week     Occasional    OB History    Grav Para Term Preterm Abortions TAB SAB Ect Mult Living                  Review of Systems  Constitutional: Negative.   HENT: Negative.   Respiratory: Negative.   Cardiovascular: Negative.   Genitourinary: Negative.     Musculoskeletal: Negative.   Skin: Positive for wound.  Neurological:       Pt states that she has a history of neuropathy    Allergies  Review of patient's allergies indicates no known allergies.  Home Medications   Current Outpatient Rx  Name Route Sig Dispense Refill  . ACETAMINOPHEN 500 MG PO TABS Oral Take 500 mg by mouth every 6 (six) hours as needed.    Marland Kitchen AMLODIPINE BESYLATE 5 MG PO TABS Oral Take 5 mg by mouth daily.    . AMOXICILLIN 250 MG PO CAPS Oral Take 250 mg by mouth 3 (three) times daily.    . AMOXICILLIN 875 MG PO TABS Oral Take 875 mg by mouth 2 (two) times daily.    Marland Kitchen CIPROFLOXACIN HCL 500 MG PO TABS Oral Take 500 mg by mouth 2 (two) times daily.    Marland Kitchen FERROUS FUMARATE 325 (106 FE) MG PO TABS Oral Take 1 tablet by mouth.    . FUROSEMIDE 20 MG PO TABS Oral Take 20 mg by mouth 2 (two) times daily.    . INSULIN GLARGINE 100 UNIT/ML [place] SOLN Subcutaneous Inject 10-15 Units into the skin 2 (two) times daily.     . INSULIN LISPRO (HUMAN) 100 UNIT/ML  SOLN Subcutaneous Inject 5-7  Units into the skin 3 (three) times daily before meals.     Marland Kitchen LISINOPRIL 2.5 MG PO TABS Oral Take 2.5 mg by mouth daily.    Marland Kitchen PRAVASTATIN SODIUM 20 MG PO TABS Oral Take 20 mg by mouth daily.    . TRAMADOL HCL 50 MG PO TABS Oral Take 50 mg by mouth every 6 (six) hours as needed. Patient uses this medication for pain.    Marland Kitchen ZOSYN IV Intravenous Inject into the vein.      BP 146/84  Pulse 100  Temp(Src) 98.5 F (36.9 C) (Oral)  Resp 20  Ht 5\' 8"  (1.727 m)  Wt 130 lb (58.968 kg)  BMI 19.77 kg/m2  SpO2 100%  LMP 05/02/2011  Physical Exam  Nursing note and vitals reviewed. Constitutional: She appears well-developed and well-nourished.  HENT:  Head: Atraumatic.  Eyes: EOM are normal.  Neck: Neck supple.  Cardiovascular: Normal rate and regular rhythm.   Pulmonary/Chest: Effort normal.  Musculoskeletal:       Pt has swelling noted to the left foot ankle and lower leg leg:no redness noted   Skin:       Pt has a scabbed area to the bottom of the left foot near the base of the great toe:no drainage or redness noted to the area:pulses intact    ED Course  Procedures (including critical care time)  Labs Reviewed  BASIC METABOLIC PANEL - Abnormal; Notable for the following:    Sodium 131 (*)    Glucose, Bld 431 (*)    BUN 28 (*)    GFR calc non Af Amer 70 (*)    GFR calc Af Amer 81 (*)    All other components within normal limits  CBC - Abnormal; Notable for the following:    Hemoglobin 9.7 (*)    HCT 30.5 (*)    MCV 73.7 (*)    MCH 23.4 (*)    RDW 17.6 (*)    Platelets 571 (*)    All other components within normal limits  GLUCOSE, CAPILLARY - Abnormal; Notable for the following:    Glucose-Capillary 268 (*)    All other components within normal limits   Dg Ankle Complete Left  07/02/2011  *RADIOLOGY REPORT*  Clinical Data: Pain and swelling  LEFT ANKLE COMPLETE - 3+ VIEW  Comparison: February 2013  Findings: No evidence of fracture or degenerative change.  No evidence of osteomyelitis or other focal lesion.  Vascular calcification is noted.  IMPRESSION: Negative  Original Report Authenticated By: Jules Schick, M.D.   US Venous Img Lower Unilateral Left  07/02/2011  *RADIOLOGY REPORT*  Clinical Data: Swelling of the left leg  LEFT LOWER EXTREMITY VENOUS DUPLEX ULTRASOUND  Technique:  Gray-scale sonography with graded compression, as well as color Doppler and duplex ultrasound, were performed to evaluate the deep venous system of the lower extremity from the level of the common femoral vein through the popliteal and proximal calf veins. Spectral Doppler was utilized to evaluate flow at rest and with distal augmentation maneuvers.  Comparison:  None.  Findings: From the common femoral vein to the proximal calf, the deep venous system appears normal with normal compression, normal color flow, normal Doppler wave forms and normal augmentation. Incidental note is made of some  ovoid entities that probably represent lymph nodes related to inflammation of the foot and ankle.  IMPRESSION: No deep venous pathology.  Lymph nodes probably related to inflammation of the foot and ankle.  Original Report Authenticated By:  Jules Schick, M.D.   Dg Foot Complete Left  07/02/2011  *RADIOLOGY REPORT*  Clinical Data: Pain and swelling  LEFT FOOT - COMPLETE 3+ VIEW  Comparison: February 2013  Findings: The bones are osteopenic.  There is vascular calcification.  There is no radiographic evidence of osteomyelitis. No fracture or other focal lesion.  Specific attention to the sesamoid bones does not show any worrisome finding by radiography.  IMPRESSION: Negative  Original Report Authenticated By: Jules Schick, M.D.     1. Ankle swelling   2. Foot pain   3. Anemia   4. Diabetes mellitus       MDM  No sign of dvt noted on us:pt exam not consistent with cellulitis:pt has a history of anemia:don't think pt needs to change antibiotics at this time and pt is to see ID in 2 days       Glendell Docker, NP 07/02/11 2138

## 2011-07-02 NOTE — ED Notes (Signed)
BS 268

## 2011-07-02 NOTE — Telephone Encounter (Signed)
She states the foot sore is worse now & needs an appt. Transferred to front. She has specific days & times she can come. I asked Stanton Kidney to work with her & find something that works with her schedule

## 2011-07-04 ENCOUNTER — Ambulatory Visit (INDEPENDENT_AMBULATORY_CARE_PROVIDER_SITE_OTHER): Payer: Medicaid Other | Admitting: Internal Medicine

## 2011-07-04 ENCOUNTER — Encounter: Payer: Self-pay | Admitting: Internal Medicine

## 2011-07-04 VITALS — BP 136/82 | HR 105 | Temp 98.4°F | Wt 127.0 lb

## 2011-07-04 DIAGNOSIS — M7989 Other specified soft tissue disorders: Secondary | ICD-10-CM

## 2011-07-04 DIAGNOSIS — L02619 Cutaneous abscess of unspecified foot: Secondary | ICD-10-CM

## 2011-07-04 DIAGNOSIS — L03119 Cellulitis of unspecified part of limb: Secondary | ICD-10-CM

## 2011-07-04 NOTE — Progress Notes (Addendum)
Patient ID: [name], female   DOB: 12-Dec-1970, 41 y.o.   MRN: [id number]  INFECTIOUS DISEASE PROGRESS NOTE    Subjective:  [name] is in for her followup visit. 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 She developed clinical evidence of foot infection in January and was started on Septra and then Cipro. She did not improve and was admitted to the hospital from February 6 to the 11th. She was treated with IV vancomycin and Zosyn and discharged home on IV vancomycin and oral Augmentin. She seemed to worsen again and the Augmentin was switched back to Zosyn on February 20. I saw her on February 21 and continued that therapy.  She completed her therapy and had the PICC line pulled on March 11. At that time she was doing much better with no evidence of infection. However over the last 2 weeks she has developed swelling of her left leg from the knee to just below the ankle associated with pain. She has not had any further swelling, redness or pain of her distal foot and she has not had any drainage from the plantar ulcer. She has not had any fever, chills or sweats. Dr. Paulla Dolly started her on oral Augmentin and Cipro on March 28. She thinks she's noted a little bit of improvement in the pain and swelling since that time. However, she has developed itching on both lower extremities. Yesterday he she took only her Cipro and she noted the itching. The day before she took only Augmentin and did not have the itching. She states that she is leaving for a weeklong vacation in the [place] on April 11. She was seen in the emergency department 2 days ago and had a negative Doppler ultrasound of her left leg as well as negative x-rays of her left leg and foot.  Objective: Temp: 98.4 F (36.9 C) (04/03 1606) Temp src: Oral (04/03 1606) BP: 136/82 mmHg (04/03 1606) Pulse Rate: 105  (04/03 1606)  General:  She is very talkative and appears  to be in good spirits Skin:  She has some excoriations on her shins from where she has been scratching Lungs:  clear Cor:  Regular S1 and S2 with no murmurs Lower extremities: she has pitting edema of her left leg from the knee to just below her ankle. She seems to have some discomfort with palpation but is able to walk without any obvious discomfort. The previous erythema , and swelling over her distal foot has resolved completely. Her chronic plantar ulcer is unchanged. There is no evidence of active cellulitis or drainage.  Lab Results Lab Results  Component Value Date   CRP 1.14* 05/24/2011    Lab Results  Component Value Date   ESRSEDRATE 55* 05/24/2011      Assessment:  I am not certain what is causing her new swelling and discomfort of her left leg. Am not absolutely convinced that this is recurrent infection at a higher level. The distal infection appears to have resolved. She believes that she may have had some improvement since oral antibiotics were restarted last week but is having trouble tolerating ciprofloxacin. I will have her stop the ciprofloxacin and continue Augmentin and followup with me early next week.  Plan: 1.  continue Augmentin 2.  Discontinue ciprofloxacin 3.  Repeat sedimentation rate and C-reactive protein 4.  Followup next week   Michel Bickers, MD [facility] [phone] pager   [phone] cell 07/04/2011, 5:05 PM   Addendum:  Lab Results  Component Value Date   CRP 2.16* 07/04/2011    Lab Results  Component Value Date   ESRSEDRATE 97* 07/04/2011    I spoke with [name] today. She is having less tenderness in her left calf but reports that it is still slightly swollen and pink. It is not warm to the touch and she is not having any fever. She has continued to take the Augmentin and decided to continue taking Cipro with a Benadryl. She is now been on antibiotics for a very prolonged time. Although her  inflammatory markers remain elevated I am not convinced that she has another infection that has migrated up to her calf. I'm worried about her developing adverse reactions to the antibiotics. She is leaving on a trip to the [place] for one week tomorrow morning. I have asked her to stop her antibiotics and followup with me upon her return. She is in agreement with that plan.  Michel Bickers, MD [facility] for [facility] [phone] pager   [phone] cell 07/11/2011, 5:21 PM

## 2011-07-04 NOTE — Progress Notes (Signed)
Phone call to [facility] on [address].  Dr Ila Mcgill rxed Cipro 500 mg, 1 bid, #50 and Augmentin 875 mg 1 bid, #50 on June 28, 2011.

## 2011-07-05 LAB — SEDIMENTATION RATE: Sed Rate: 97 mm/hr — ABNORMAL HIGH (ref 0–22)

## 2011-07-05 LAB — C-REACTIVE PROTEIN: CRP: 2.16 mg/dL — ABNORMAL HIGH (ref <0.60)

## 2011-07-11 ENCOUNTER — Telehealth: Payer: Self-pay | Admitting: *Deleted

## 2011-07-11 NOTE — Telephone Encounter (Signed)
Patient called asked about the results of her lab work from last week. Advised her that her provider is out of the office but will be in later today. Advised her will have him review the results then and give her a call with his recommendation. She advised she will be at work and it is ok to leave a message on her voicemail. She also advised she is leaving for the [place] at 5 am tomorrow morning. If it is a Rx she needs [facility] uses [facility] on [address].

## 2011-07-25 ENCOUNTER — Other Ambulatory Visit: Payer: Self-pay | Admitting: Internal Medicine

## 2011-07-25 ENCOUNTER — Ambulatory Visit
Admission: RE | Admit: 2011-07-25 | Discharge: 2011-07-25 | Disposition: A | Discharge status: Home / Self Care | Payer: Medicaid Other | Source: Ambulatory Visit | Attending: Internal Medicine | Admitting: Internal Medicine

## 2011-07-25 ENCOUNTER — Telehealth: Payer: Self-pay | Admitting: *Deleted

## 2011-07-25 ENCOUNTER — Encounter: Payer: Self-pay | Admitting: Internal Medicine

## 2011-07-25 ENCOUNTER — Ambulatory Visit (INDEPENDENT_AMBULATORY_CARE_PROVIDER_SITE_OTHER): Payer: Medicaid Other | Admitting: Internal Medicine

## 2011-07-25 VITALS — BP 167/104 | HR 93 | Temp 98.4°F | Ht 68.0 in | Wt 128.2 lb

## 2011-07-25 DIAGNOSIS — L02619 Cutaneous abscess of unspecified foot: Secondary | ICD-10-CM

## 2011-07-25 DIAGNOSIS — S92902A Unspecified fracture of left foot, initial encounter for closed fracture: Secondary | ICD-10-CM

## 2011-07-25 DIAGNOSIS — L03119 Cellulitis of unspecified part of limb: Secondary | ICD-10-CM

## 2011-07-25 DIAGNOSIS — S92909A Unspecified fracture of unspecified foot, initial encounter for closed fracture: Secondary | ICD-10-CM

## 2011-07-25 NOTE — Telephone Encounter (Signed)
[facility] called to report that her xray revealed multiple fractures of the tarsal bones. States she is faxing it here. The radiologist wanted to make sure the md here knew. md is in a room., I informed his nurse . She will let him know & I will send the fax to his desk when rec;d

## 2011-07-25 NOTE — Progress Notes (Addendum)
Patient ID: [name], female   DOB: July 20, 1970, 41 y.o.   MRN: [id number]  INFECTIOUS DISEASE PROGRESS NOTE    day 51 antibiotic therapy with brief lapse from April 10-13  Subjective: Ms. Semar is in for her routine followup visit. I instructed her to stop taking her ciprofloxacin and Augmentin on April 10 shortly before her trip to the [place]. She states that 3 days later, while on her cruise, she started having swelling and pain in her left foot so she restarted the antibiotics and has been taking them ever since. She states that her foot has improved but she continues to have swelling up to the level of the knee. She has not had any fever, chills, or sweats. She is tolerating her antibiotics well by taking one Benadryl daily. She states that she has not taken any of her blood pressure medication today because she has been busy and away from home.  Objective: Temp: 98.4 F (36.9 C) (04/24 1447) Temp src: Oral (04/24 1447) BP: 167/104 mmHg (04/24 1447) Pulse Rate: 93  (04/24 1447)  General: She is in good spirits and talkative as usual Skin: No rash Lungs: Clear Cor: Regular S1 and S2 no murmurs Left leg: She has 1+ pitting edema to the midshin. She has no evidence of cellulitis or abscess. She has a very small callus at the site of her previous left plantar ulcer. There is no drainage. She believes she has a new protrusion on her instep medially. That area feels like a bony prominence. It is nontender.  Lab Results Lab Results  Component Value Date   CRP 2.16* 07/04/2011    Lab Results  Component Value Date   ESRSEDRATE 97* 07/04/2011      Assessment: She has been on prolonged antibiotics and I am not absolutely certain that she has any ongoing, active infection. I will repeat her plain x-ray and inflammatory markers and then make a decision about continued antibiotic therapy.  Plan: 1. Left foot x-ray 2. Repeat C-reactive protein and sedimentation rate   Michel Bickers, MD [facility] for [place] [phone] pager   [phone] cell 07/25/2011, 3:09 PM   Addendum:  Lab Results  Component Value Date   CRP 0.20 07/25/2011    Lab Results  Component Value Date   ESRSEDRATE 75* 07/25/2011     Left foot x-ray: Her x-ray reveals numerous fractures of the tarsal and some metatarsal bones.  Plan: 1. Discontinue ciprofloxacin and Augmentin 2. Continue wearing her boot 3. Orthopedic referral  Michel Bickers, MD [facility] for [place] [phone] pager   [phone] cell 07/26/2011, 5:11 PM  3. R

## 2011-07-26 NOTE — Progress Notes (Signed)
Addended by: Lorne Skeens D on: 07/26/2011 05:08 PM   Modules accepted: Orders

## 2011-07-27 ENCOUNTER — Telehealth: Payer: Self-pay | Admitting: *Deleted

## 2011-07-27 NOTE — Telephone Encounter (Signed)
Message left for pt on mobile phone.  Appt Monday, July 30, 2011 @ 4:00 PM, arrive at 3:45 PM.  [employer], [place] [place], Dr. Sharol Given.  Pt needs to bring a photo ID and insurance card.  Requested pt to call back if she has any questions.

## 2011-07-30 ENCOUNTER — Telehealth: Payer: Self-pay | Admitting: *Deleted

## 2011-07-30 NOTE — Telephone Encounter (Signed)
Message left.  Fractured Foot appt w/ Dr. Sharol Given, today, 07/30/11 @ 4 PM, [address] [employer].

## 2011-08-13 ENCOUNTER — Telehealth: Payer: Self-pay | Admitting: *Deleted

## 2011-08-13 NOTE — Telephone Encounter (Signed)
Because she is not able to pay her rent, she is renting a room to another. The home association wants her to not do this. She believes that a letter from [place] will help. I told her i do not know if he can write that & that it will help. She asked if she could get an AVS that she usually gets. She threw the last one away. I told her to come sign a release & we can print one for her.

## 2011-08-14 ENCOUNTER — Telehealth: Payer: Self-pay | Admitting: *Deleted

## 2011-08-14 NOTE — Telephone Encounter (Signed)
She signed a release. Unable to print after visit summary. Gave her a copy of the snapshot page at her request

## 2011-08-28 ENCOUNTER — Ambulatory Visit (INDEPENDENT_AMBULATORY_CARE_PROVIDER_SITE_OTHER): Payer: Medicaid Other | Admitting: Internal Medicine

## 2011-08-28 ENCOUNTER — Encounter: Payer: Self-pay | Admitting: Internal Medicine

## 2011-08-28 VITALS — BP 112/73 | HR 88 | Temp 98.5°F | Wt 130.0 lb

## 2011-08-28 DIAGNOSIS — E1142 Type 2 diabetes mellitus with diabetic polyneuropathy: Secondary | ICD-10-CM

## 2011-08-28 DIAGNOSIS — E1140 Type 2 diabetes mellitus with diabetic neuropathy, unspecified: Secondary | ICD-10-CM

## 2011-08-28 DIAGNOSIS — E1149 Type 2 diabetes mellitus with other diabetic neurological complication: Secondary | ICD-10-CM

## 2011-08-28 NOTE — Progress Notes (Signed)
Patient ID: [name], female   DOB: Aug 07, 1970, 41 y.o.   MRN: [id number]     [facility] for Infectious Disease  Patient Active Problem List  Diagnoses  . Neuropathy in diabetes  . Hypertension  . Hypercholesteremia  . Iron deficiency anemia, unspecified  . Cellulitis and abscess of foot  . 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 Diabetes mellitus high risk  . Thrombocytosis  . Leg swelling    Patient's Medications  New Prescriptions   No medications on file  Previous Medications   ACETAMINOPHEN (TYLENOL) 500 MG TABLET    Take 500 mg by mouth every 6 (six) hours as needed.   AMLODIPINE (NORVASC) 5 MG TABLET    Take 5 mg by mouth daily.   FERROUS FUMARATE (HEMOCYTE - 106 MG FE) 325 (106 FE) MG TABS    Take 1 tablet by mouth.   FUROSEMIDE (LASIX) 20 MG TABLET    Take 20 mg by mouth 2 (two) times daily.   INSULIN GLARGINE (LANTUS) 100 UNIT/ML INJECTION    Inject 10-15 Units into the skin 2 (two) times daily.    INSULIN LISPRO (HUMALOG) 100 UNIT/ML INJECTION    Inject 5-7 Units into the skin 3 (three) times daily before meals.    LISINOPRIL (PRINIVIL,ZESTRIL) 2.5 MG TABLET    Take 2.5 mg by mouth daily.   PRAVASTATIN (PRAVACHOL) 20 MG TABLET    Take 20 mg by mouth daily.   TRAMADOL (ULTRAM) 50 MG TABLET    Take 50 mg by mouth every 6 (six) hours as needed. Patient uses this medication for pain.  Modified Medications   No medications on file  Discontinued Medications   AMOXICILLIN-CLAVULANATE (AUGMENTIN) 875-125 MG PER TABLET    Take 1 tablet by mouth 2 (two) times daily.   CIPROFLOXACIN (CIPRO) 500 MG TABLET    Take 500 mg by mouth 2 (two) times daily.    Subjective: She has been wearing her boot since she saw Dr. Sharol Given. She feels like she is doing better since stopping her antibiotics at the time of her last visit with me on April 24.  Objective: Temp: 98.5 F (36.9 C) (05/28 0928) Temp src: Oral (05/28 0928) BP: 112/73 mmHg (05/28 0928) Pulse Rate: 88  (05/28  0928)  General: She is animated and talkative as usual Left foot: She continues to have generalized pitting edema to the midshin but no evidence of cellulitis. She has a pinpoint callus under her left first metatarsal head without any evidence of infection.   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 She has no evidence of active infection at this time.  Plan: 1. Continue observation off of antibiotics 2. Followup here as needed   Michel Bickers, MD [facility] for [facility] [phone] pager   [phone] cell 08/28/2011, 9:38 AM

## 2011-10-08 ENCOUNTER — Encounter (HOSPITAL_BASED_OUTPATIENT_CLINIC_OR_DEPARTMENT_OTHER): Payer: Self-pay | Admitting: Emergency Medicine

## 2011-10-08 DIAGNOSIS — I10 Essential (primary) hypertension: Secondary | ICD-10-CM | POA: Insufficient documentation

## 2011-10-08 DIAGNOSIS — E7800 Pure hypercholesterolemia, unspecified: Secondary | ICD-10-CM | POA: Insufficient documentation

## 2011-10-08 DIAGNOSIS — E119 Type 2 diabetes mellitus without complications: Secondary | ICD-10-CM | POA: Insufficient documentation

## 2011-10-08 DIAGNOSIS — M79609 Pain in unspecified limb: Secondary | ICD-10-CM | POA: Insufficient documentation

## 2011-10-08 DIAGNOSIS — M7989 Other specified soft tissue disorders: Secondary | ICD-10-CM | POA: Insufficient documentation

## 2011-10-08 DIAGNOSIS — Z79899 Other long term (current) drug therapy: Secondary | ICD-10-CM | POA: Insufficient documentation

## 2011-10-08 NOTE — ED Notes (Signed)
Recent surgery for several fx's in left foot.  Took boot off x 1 week ago ,doing ok til today has severe pain in left foot with some swelling.  Replaced boot

## 2011-10-09 ENCOUNTER — Emergency Department (HOSPITAL_BASED_OUTPATIENT_CLINIC_OR_DEPARTMENT_OTHER): Admit: 2011-10-09 | Discharge: 2011-10-09 | Disposition: A | Discharge status: Home / Self Care | Payer: Medicaid Other

## 2011-10-09 ENCOUNTER — Emergency Department (HOSPITAL_BASED_OUTPATIENT_CLINIC_OR_DEPARTMENT_OTHER)
Admission: EM | Admit: 2011-10-09 | Discharge: 2011-10-09 | Disposition: A | Discharge status: Home / Self Care | Payer: Medicaid Other | Attending: Emergency Medicine | Admitting: Emergency Medicine

## 2011-10-09 DIAGNOSIS — M7989 Other specified soft tissue disorders: Secondary | ICD-10-CM

## 2011-10-09 LAB — BASIC METABOLIC PANEL
CO2: 26 mEq/L (ref 19–32)
Calcium: 8.4 mg/dL (ref 8.4–10.5)
Creatinine, Ser: 0.8 mg/dL (ref 0.50–1.10)
GFR calc Af Amer: >90 mL/min (ref >90)
GFR calc non Af Amer: >90 mL/min (ref >90)
Sodium: 137 mEq/L (ref 135–145)

## 2011-10-09 LAB — CBC WITH DIFFERENTIAL/PLATELET
Basophils Absolute: 0 10*3/uL (ref 0.0–0.1)
Basophils Relative: 0 % (ref 0–1)
Eosinophils Absolute: 0.1 10*3/uL (ref 0.0–0.7)
Eosinophils Relative: 2 % (ref 0–5)
Lymphocytes Relative: 24 % (ref 12–46)
MCHC: 32.7 g/dL (ref 30.0–36.0)
MCV: 78 fL (ref 78.0–100.0)
Platelets: 474 10*3/uL — ABNORMAL HIGH (ref 150–400)
RDW: 16.1 % — ABNORMAL HIGH (ref 11.5–15.5)
WBC: 8.1 10*3/uL (ref 4.0–10.5)

## 2011-10-09 MED ORDER — IBUPROFEN 200 MG PO TABS
ORAL_TABLET | ORAL | Status: AC
  Filled 2011-10-09: qty 3

## 2011-10-09 MED ORDER — DOXYCYCLINE HYCLATE 100 MG PO CAPS: 100.0000 mg | ORAL_CAPSULE | Freq: Two times a day (BID) | ORAL | Status: DC

## 2011-10-09 NOTE — ED Provider Notes (Signed)
History     CSN: [id number]  Arrival date & time 10/08/11  2335   First MD Initiated Contact with Patient 10/09/11 0113      Chief Complaint  Patient presents with  . Foot Pain    (Consider location/radiation/quality/duration/timing/severity/associated sxs/prior treatment) Patient is a 41 y.o. female presenting with lower extremity pain. The history is provided by the patient.  Foot Pain This is a chronic problem. The current episode started more than 1 week ago (but worse in the last week and still worse today.  States she feels the bones in the foot moving.  Knows she has fractures and has not had surgery took cam walker off over a week ago and that is when the symptoms got worse). The problem occurs constantly. The problem has been gradually worsening. Pertinent negatives include no chest pain, no abdominal pain, no headaches and no shortness of breath. Nothing aggravates the symptoms. She has tried nothing for the symptoms. The treatment provided no relief.  No erythema or wamth.  No f/c/r.  Has 2 ulcers on the foot which as long standing and healing.  No drainage no swelling.  No pain in the leg or calf.    Past Medical History  Diagnosis Date  . Diabetes mellitus   . Neuropathy in diabetes   . Hypertension   . Hypercholesteremia   . H/O seasonal allergies   . Anemia   . Left foot infection     Past Surgical History  Procedure Date  . Cesarean section   . Eye surgery   . Hemrrhoidectomy   . Peripherally inserted central catheter insertion     No family history on file.  History  Substance Use Topics  . Smoking status: Former Smoker    Quit date: 05/16/2008  . Smokeless tobacco: Not on file  . Alcohol Use: 0.5 oz/week    1 drink(s) per week     Occasional    OB History    Grav Para Term Preterm Abortions TAB SAB Ect Mult Living                  Review of Systems  Constitutional: Negative for fever.  Respiratory: Negative for choking and shortness of  breath.   Cardiovascular: Negative for chest pain and leg swelling.  Gastrointestinal: Negative for abdominal pain.  Musculoskeletal: Negative for gait problem.  Neurological: Negative for headaches.  All other systems reviewed and are negative.    Allergies  Review of patient's allergies indicates no known allergies.  Home Medications   Current Outpatient Rx  Name Route Sig Dispense Refill  . ACETAMINOPHEN 500 MG PO TABS Oral Take 500 mg by mouth every 6 (six) hours as needed.    Marland Kitchen AMLODIPINE BESYLATE 5 MG PO TABS Oral Take 5 mg by mouth daily.    Marland Kitchen DOXYCYCLINE HYCLATE 100 MG PO CAPS Oral Take 1 capsule (100 mg total) by mouth 2 (two) times daily. One po bid x 7 days 14 capsule 0  . FERROUS FUMARATE 325 (106 FE) MG PO TABS Oral Take 1 tablet by mouth.    . FUROSEMIDE 20 MG PO TABS Oral Take 20 mg by mouth 2 (two) times daily.    . INSULIN GLARGINE 100 UNIT/ML [place] SOLN Subcutaneous Inject 10-15 Units into the skin 2 (two) times daily.     . INSULIN LISPRO (HUMAN) 100 UNIT/ML [place] SOLN Subcutaneous Inject 5-7 Units into the skin 3 (three) times daily before meals.     Marland Kitchen LISINOPRIL  2.5 MG PO TABS Oral Take 2.5 mg by mouth daily.    Marland Kitchen PRAVASTATIN SODIUM 20 MG PO TABS Oral Take 20 mg by mouth daily.    . TRAMADOL HCL 50 MG PO TABS Oral Take 50 mg by mouth every 6 (six) hours as needed. Patient uses this medication for pain.      BP 132/73  Pulse 104  Temp 98.2 F (36.8 C) (Oral)  Resp 20  Ht 5\' 8"  (1.727 m)  Wt 125 lb (56.7 kg)  BMI 19.01 kg/m2  SpO2 100%  Physical Exam  Constitutional: She is oriented to person, place, and time. She appears well-developed and well-nourished.  HENT:  Head: Normocephalic and atraumatic.  Mouth/Throat: Oropharynx is clear and moist.  Eyes: Conjunctivae are normal. Pupils are equal, round, and reactive to light.  Neck: Normal range of motion. Neck supple.  Cardiovascular: Normal rate and regular rhythm.   Pulmonary/Chest: Effort normal and breath  sounds normal. She has no wheezes. She has no rales.  Abdominal: Soft. Bowel sounds are normal. There is no tenderness.  Musculoskeletal: Normal range of motion.       Feet:       Healing ulcer over plantar surface metarsal 9 mm no drainage  Neurological: She is alert and oriented to person, place, and time. She has normal reflexes.  Skin: Skin is warm and dry.  Psychiatric: She has a normal mood and affect.    ED Course  Procedures (including critical care time)  Labs Reviewed  CBC WITH DIFFERENTIAL - Abnormal; Notable for the following:    RBC 3.81 (*)     Hemoglobin 9.7 (*)     HCT 29.7 (*)     MCH 25.5 (*)     RDW 16.1 (*)     Platelets 474 (*)     All other components within normal limits  BASIC METABOLIC PANEL - Abnormal; Notable for the following:    Potassium 5.4 (*)     Glucose, Bld 188 (*)     BUN 29 (*)     All other components within normal limits   Dg Foot Complete Left  10/09/2011  *RADIOLOGY REPORT*  Clinical Data: Pain  LEFT FOOT - COMPLETE 3+ VIEW  Comparison: 07/25/2011  Findings: Displaced fractures of the navicular bone are stable. Fracture at the base of the third metatarsal is again present.  Pes planus deformity is noted.  Fracture of the medial cuneiform is suspected.  Severe osteopenia.  IMPRESSION: Several fractures with displacement are again noted and are not significantly changed compared the prior study.  This includes the base of the third metatarsal, navicular, and probably the medial cuneiform.  Original Report Authenticated By: Jamas Lav, M.D.     1. Leg swelling       MDM  Diabetic wounds and multiple fractures of the foot, unchanged from previous.  Will start on doxycycline and have patient follow up with her PMD in am for ongoing care of wounds and and orthopedics this week about ongoing care of fractures.  Continue to use cam walker.  No osteomyelitis on XR.  No lab abnormalities.  Patient verbalizes understanding and agrees to follow  up.  Return immediately for fevers, drainage redness of the skin or any concerns.        Carlisle Beers, MD 10/09/11 [phone]

## 2011-10-17 ENCOUNTER — Emergency Department (HOSPITAL_BASED_OUTPATIENT_CLINIC_OR_DEPARTMENT_OTHER)
Admission: EM | Admit: 2011-10-17 | Discharge: 2011-10-17 | Disposition: A | Discharge status: Home / Self Care | Payer: Medicaid Other | Attending: Emergency Medicine | Admitting: Emergency Medicine

## 2011-10-17 ENCOUNTER — Encounter (HOSPITAL_BASED_OUTPATIENT_CLINIC_OR_DEPARTMENT_OTHER): Payer: Self-pay | Admitting: *Deleted

## 2011-10-17 DIAGNOSIS — I10 Essential (primary) hypertension: Secondary | ICD-10-CM | POA: Insufficient documentation

## 2011-10-17 DIAGNOSIS — E119 Type 2 diabetes mellitus without complications: Secondary | ICD-10-CM | POA: Insufficient documentation

## 2011-10-17 DIAGNOSIS — E7800 Pure hypercholesterolemia, unspecified: Secondary | ICD-10-CM | POA: Insufficient documentation

## 2011-10-17 DIAGNOSIS — Z87891 Personal history of nicotine dependence: Secondary | ICD-10-CM | POA: Insufficient documentation

## 2011-10-17 DIAGNOSIS — N390 Urinary tract infection, site not specified: Secondary | ICD-10-CM

## 2011-10-17 DIAGNOSIS — Z9109 Other allergy status, other than to drugs and biological substances: Secondary | ICD-10-CM | POA: Insufficient documentation

## 2011-10-17 DIAGNOSIS — Z794 Long term (current) use of insulin: Secondary | ICD-10-CM | POA: Insufficient documentation

## 2011-10-17 DIAGNOSIS — D649 Anemia, unspecified: Secondary | ICD-10-CM | POA: Insufficient documentation

## 2011-10-17 LAB — URINALYSIS, ROUTINE W REFLEX MICROSCOPIC
Bilirubin Urine: NEGATIVE
Ketones, ur: NEGATIVE mg/dL
Nitrite: NEGATIVE
Protein, ur: >300 mg/dL — AB
Urobilinogen, UA: 0.2 mg/dL (ref 0.0–1.0)

## 2011-10-17 LAB — COMPREHENSIVE METABOLIC PANEL
Albumin: 2.4 g/dL — ABNORMAL LOW (ref 3.5–5.2)
Alkaline Phosphatase: 142 U/L — ABNORMAL HIGH (ref 39–117)
BUN: 28 mg/dL — ABNORMAL HIGH (ref 6–23)
Chloride: 104 mEq/L (ref 96–112)
Creatinine, Ser: 1 mg/dL (ref 0.50–1.10)
GFR calc Af Amer: 81 mL/min — ABNORMAL LOW (ref >90)
Glucose, Bld: 196 mg/dL — ABNORMAL HIGH (ref 70–99)
Potassium: 4.2 mEq/L (ref 3.5–5.1)
Total Bilirubin: 0.1 mg/dL — ABNORMAL LOW (ref 0.3–1.2)

## 2011-10-17 LAB — CBC WITH DIFFERENTIAL/PLATELET
Basophils Relative: 0 % (ref 0–1)
Eosinophils Absolute: 0 10*3/uL (ref 0.0–0.7)
HCT: 28.4 % — ABNORMAL LOW (ref 36.0–46.0)
Hemoglobin: 9.4 g/dL — ABNORMAL LOW (ref 12.0–15.0)
Lymphs Abs: 1.4 10*3/uL (ref 0.7–4.0)
MCH: 25.5 pg — ABNORMAL LOW (ref 26.0–34.0)
MCHC: 33.1 g/dL (ref 30.0–36.0)
Monocytes Absolute: 0.7 10*3/uL (ref 0.1–1.0)
Monocytes Relative: 6 % (ref 3–12)
Neutro Abs: 8.5 10*3/uL — ABNORMAL HIGH (ref 1.7–7.7)
Neutrophils Relative %: 80 % — ABNORMAL HIGH (ref 43–77)
RBC: 3.68 MIL/uL — ABNORMAL LOW (ref 3.87–5.11)

## 2011-10-17 LAB — PREGNANCY, URINE: Preg Test, Ur: NEGATIVE

## 2011-10-17 MED ORDER — AMOXICILLIN 500 MG PO CAPS: 500.0000 mg | ORAL_CAPSULE | Freq: Three times a day (TID) | ORAL | Status: AC

## 2011-10-17 NOTE — ED Notes (Signed)
Pt c/o sudden onset of dizziness x 1 day

## 2011-10-17 NOTE — ED Provider Notes (Signed)
History     CSN: [id number]  Arrival date & time 10/17/11  1617   First MD Initiated Contact with Patient 10/17/11 1702      Chief Complaint  Patient presents with  . Dizziness    (Consider location/radiation/quality/duration/timing/severity/associated sxs/prior treatment) Patient is a 41 y.o. female presenting with weakness. The history is provided by the patient. No language interpreter was used.  Weakness The primary symptoms include headaches. Primary symptoms do not include nausea. The symptoms began 12 to 24 hours ago.  The headache is associated with weakness.  Additional symptoms include weakness and vertigo.   Pt complains of feeling dizzy today.   Pt reports she noticed when she woke up.  Pt concerned that she  Past Medical History  Diagnosis Date  . Diabetes mellitus   . Neuropathy in diabetes   . Hypertension   . Hypercholesteremia   . H/O seasonal allergies   . Anemia   . Left foot infection     Past Surgical History  Procedure Date  . Cesarean section   . Eye surgery   . Hemrrhoidectomy   . Peripherally inserted central catheter insertion     History reviewed. No pertinent family history.  History  Substance Use Topics  . Smoking status: Former Smoker    Quit date: 05/16/2008  . Smokeless tobacco: Not on file  . Alcohol Use: 0.5 oz/week    1 drink(s) per week     Occasional    OB History    Grav Para Term Preterm Abortions TAB SAB Ect Mult Living                  Review of Systems  Gastrointestinal: Negative for nausea.  Neurological: Positive for vertigo, weakness and headaches.    Allergies  Review of patient's allergies indicates no known allergies.  Home Medications   Current Outpatient Rx  Name Route Sig Dispense Refill  . AMLODIPINE BESYLATE 5 MG PO TABS Oral Take 5 mg by mouth daily.    . ASPIRIN 81 MG PO TABS Oral Take 81 mg by mouth daily.    Marland Kitchen FERROUS FUMARATE 325 (106 FE) MG PO TABS Oral Take 1 tablet by mouth.    .  FUROSEMIDE 20 MG PO TABS Oral Take 20 mg by mouth 2 (two) times daily.    . INSULIN GLARGINE 100 UNIT/ML [place] SOLN Subcutaneous Inject 10-15 Units into the skin 2 (two) times daily.     . INSULIN LISPRO (HUMAN) 100 UNIT/ML [place] SOLN Subcutaneous Inject 5-7 Units into the skin 3 (three) times daily before meals.     Marland Kitchen LISINOPRIL 2.5 MG PO TABS Oral Take 2.5 mg by mouth daily.    Marland Kitchen PRAVASTATIN SODIUM 20 MG PO TABS Oral Take 20 mg by mouth daily.    . TRAMADOL HCL 50 MG PO TABS Oral Take 50 mg by mouth every 6 (six) hours as needed. Patient uses this medication for pain.      BP 92/58  Pulse 98  Temp 98.8 F (37.1 C) (Oral)  Resp 16  Ht 5\' 8"  (1.727 m)  Wt 130 lb (58.968 kg)  BMI 19.77 kg/m2  SpO2 100%  LMP 09/28/2011  Physical Exam  Nursing note and vitals reviewed. Constitutional: She is oriented to person, place, and time. She appears well-developed and well-nourished.  HENT:  Head: Normocephalic and atraumatic.  Right Ear: External ear normal.  Left Ear: External ear normal.  Nose: Nose normal.  Eyes: Conjunctivae and EOM are normal.  Pupils are equal, round, and reactive to light.  Neck: Normal range of motion. Neck supple.  Cardiovascular: Normal rate and normal heart sounds.   Pulmonary/Chest: Effort normal.  Abdominal: Soft.  Musculoskeletal: Normal range of motion.  Neurological: She is alert and oriented to person, place, and time. She has normal reflexes.  Skin: Skin is warm.  Psychiatric: She has a normal mood and affect.    ED Course  Procedures (including critical care time)  Labs Reviewed  URINALYSIS, ROUTINE W REFLEX MICROSCOPIC - Abnormal; Notable for the following:    APPearance TURBID (*)     Glucose, UA 500 (*)     Hgb urine dipstick MODERATE (*)     Protein, ur >300 (*)     Leukocytes, UA LARGE (*)     All other components within normal limits  URINE MICROSCOPIC-ADD ON - Abnormal; Notable for the following:    Squamous Epithelial / LPF FEW (*)      Bacteria, UA MANY (*)     All other components within normal limits  PREGNANCY, URINE   No results found.   1. UTI (lower urinary tract infection)   2. Anemia       MDM    Pt given rx for [place], [place] 10/17/11 [address] [place], [place] 10/17/11 2002

## 2011-10-17 NOTE — ED Notes (Signed)
In to assess patient she is sitting up in bed reading magazine states she can read fine is only dizzy when she  Moves her head side to side and also states she "had sex during ovulation last week so I might be pregnant"  Pt reports LMP June 28.

## 2011-10-17 NOTE — ED Provider Notes (Signed)
Medical screening examination/treatment/procedure(s) were performed by non-physician practitioner and as supervising physician I was immediately available for consultation/collaboration.   Blanchie Dessert, MD 10/17/11 [phone]

## 2011-10-17 NOTE — ED Notes (Signed)
PA at bedside.

## 2011-12-10 ENCOUNTER — Encounter (HOSPITAL_BASED_OUTPATIENT_CLINIC_OR_DEPARTMENT_OTHER): Payer: Self-pay | Admitting: *Deleted

## 2011-12-10 ENCOUNTER — Emergency Department (HOSPITAL_BASED_OUTPATIENT_CLINIC_OR_DEPARTMENT_OTHER)
Admission: EM | Admit: 2011-12-10 | Discharge: 2011-12-11 | Disposition: A | Discharge status: Home / Self Care | Payer: Medicaid Other | Attending: Emergency Medicine | Admitting: Emergency Medicine

## 2011-12-10 DIAGNOSIS — E119 Type 2 diabetes mellitus without complications: Secondary | ICD-10-CM

## 2011-12-10 DIAGNOSIS — Z87891 Personal history of nicotine dependence: Secondary | ICD-10-CM | POA: Insufficient documentation

## 2011-12-10 DIAGNOSIS — I10 Essential (primary) hypertension: Secondary | ICD-10-CM | POA: Insufficient documentation

## 2011-12-10 DIAGNOSIS — E11621 Type 2 diabetes mellitus with foot ulcer: Secondary | ICD-10-CM

## 2011-12-10 DIAGNOSIS — Z794 Long term (current) use of insulin: Secondary | ICD-10-CM | POA: Insufficient documentation

## 2011-12-10 DIAGNOSIS — L97509 Non-pressure chronic ulcer of other part of unspecified foot with unspecified severity: Secondary | ICD-10-CM | POA: Insufficient documentation

## 2011-12-10 DIAGNOSIS — Z7982 Long term (current) use of aspirin: Secondary | ICD-10-CM | POA: Insufficient documentation

## 2011-12-10 DIAGNOSIS — E1069 Type 1 diabetes mellitus with other specified complication: Secondary | ICD-10-CM | POA: Insufficient documentation

## 2011-12-10 NOTE — ED Notes (Signed)
Pt is diabetic and has a ulcer to left foot  X 1 month

## 2011-12-11 ENCOUNTER — Emergency Department (HOSPITAL_BASED_OUTPATIENT_CLINIC_OR_DEPARTMENT_OTHER): Payer: Medicaid Other

## 2011-12-11 ENCOUNTER — Encounter (HOSPITAL_BASED_OUTPATIENT_CLINIC_OR_DEPARTMENT_OTHER): Payer: Self-pay | Admitting: Emergency Medicine

## 2011-12-11 LAB — CBC WITH DIFFERENTIAL/PLATELET
Basophils Absolute: 0 10*3/uL (ref 0.0–0.1)
Basophils Relative: 0 % (ref 0–1)
Eosinophils Absolute: 0.1 10*3/uL (ref 0.0–0.7)
Eosinophils Relative: 1 % (ref 0–5)
HCT: 29.1 % — ABNORMAL LOW (ref 36.0–46.0)
Hemoglobin: 9.3 g/dL — ABNORMAL LOW (ref 12.0–15.0)
Lymphocytes Relative: 33 % (ref 12–46)
Lymphs Abs: 2.3 10*3/uL (ref 0.7–4.0)
MCH: 25 pg — ABNORMAL LOW (ref 26.0–34.0)
MCHC: 32 g/dL (ref 30.0–36.0)
MCV: 78.2 fL (ref 78.0–100.0)
Monocytes Absolute: 0.7 K/uL (ref 0.1–1.0)
Monocytes Relative: 9 % (ref 3–12)
Neutro Abs: 4 K/uL (ref 1.7–7.7)
Neutrophils Relative %: 56 % (ref 43–77)
Platelets: 480 10*3/uL — ABNORMAL HIGH (ref 150–400)
RBC: 3.72 MIL/uL — ABNORMAL LOW (ref 3.87–5.11)
RDW: 13.8 % (ref 11.5–15.5)
WBC: 7.1 K/uL (ref 4.0–10.5)

## 2011-12-11 LAB — BASIC METABOLIC PANEL
Calcium: 8.6 mg/dL (ref 8.4–10.5)
GFR calc Af Amer: >90 mL/min (ref >90)
GFR calc non Af Amer: >90 mL/min (ref >90)
Potassium: 4 mEq/L (ref 3.5–5.1)
Sodium: 139 mEq/L (ref 135–145)

## 2011-12-11 LAB — BASIC METABOLIC PANEL WITH GFR
BUN: 24 mg/dL — ABNORMAL HIGH (ref 6–23)
CO2: 24 meq/L (ref 19–32)
Chloride: 103 meq/L (ref 96–112)
Creatinine, Ser: 0.8 mg/dL (ref 0.50–1.10)
Glucose, Bld: 246 mg/dL — ABNORMAL HIGH (ref 70–99)

## 2011-12-11 MED ORDER — METRONIDAZOLE 500 MG PO TABS: 500.0000 mg | ORAL_TABLET | Freq: Two times a day (BID) | ORAL | Status: AC

## 2011-12-11 MED ORDER — METRONIDAZOLE IN NACL 5-0.79 MG/ML-% IV SOLN
500.0000 mg | Freq: Once | INTRAVENOUS | Status: AC
  Administered 2011-12-11: 500 mg via INTRAVENOUS
  Filled 2011-12-11: qty 100

## 2011-12-11 MED ORDER — CIPROFLOXACIN HCL 500 MG PO TABS: 500.0000 mg | ORAL_TABLET | Freq: Two times a day (BID) | ORAL | Status: AC

## 2011-12-11 MED ORDER — CIPROFLOXACIN IN D5W 400 MG/200ML IV SOLN
400.0000 mg | Freq: Once | INTRAVENOUS | Status: AC
  Administered 2011-12-11: 400 mg via INTRAVENOUS
  Filled 2011-12-11: qty 200

## 2011-12-11 NOTE — ED Notes (Signed)
Pt c/o itching to right arm after starting antibiotics. No redness, hives, or rash noted to extremity. Informed pt that we would continue to monitor to see if sxs worsen.

## 2011-12-11 NOTE — ED Notes (Signed)
Patient transported to X-ray 

## 2011-12-11 NOTE — ED Provider Notes (Signed)
History    This chart was scribed for [name]. Nancy Mai, MD by [name]. The patient was seen in room MH12/MH12 and the patient's care was started at 12:32AM.    CSN: [id number]  Arrival date & time 12/10/11  2147   First MD Initiated Contact with Patient 12/11/11 0032      Chief Complaint  Patient presents with  . Wound Infection    (Consider location/radiation/quality/duration/timing/severity/associated sxs/prior treatment) The history is provided by the patient. No language interpreter was used.    [name] is a 41 y.o. female , with a hx of type I diabetes (onset 41 years of age), who presents to the Emergency Department complaining of gradual, progressively worsening, ulcer located at the left foot onset two months ago, with associated symptoms of swelling and numbness located at the left lower extremity. The pt reports she is usually experiences significant swelling at the left lower extremity, however, the swelling is remarkably reduced at present. Gets much worse after workign on her feet all day/ Modifying factors include taking antibiotics which do not provide any relief of the swelling.  She found some old amoxicillin and began taking that a few days.ago.  She called her PCP at [facility] but they have closed.  Pt has had priro ulcer to different location of same foot after fracturing it and she required 6 day admission, home PIC line and IV abx, prolonged.  The pt has a hx of an ultrasound procedure (performed at [facility] on LLE within the last year), neuropathy, and left foot infection.  The pt does not smoke, however, she drinks alcohol on occasion.    Pt does not have a PCP.    Past Medical History  Diagnosis Date  . Diabetes mellitus   . Neuropathy in diabetes   . Hypertension   . Hypercholesteremia   . H/O seasonal allergies   . Anemia   . Left foot infection     Past Surgical History  Procedure Date  . Cesarean section   . Eye surgery   . Hemrrhoidectomy    . Peripherally inserted central catheter insertion     History reviewed. No pertinent family history.  History  Substance Use Topics  . Smoking status: Former Smoker    Quit date: 05/16/2008  . Smokeless tobacco: Not on file  . Alcohol Use: 0.5 oz/week    1 drink(s) per week     Occasional    OB History    Grav Para Term Preterm Abortions TAB SAB Ect Mult Living                  Review of Systems  Constitutional: Negative for fever and chills.  Cardiovascular: Positive for leg swelling.  Gastrointestinal: Negative for nausea and vomiting.  Musculoskeletal: Negative for back pain.  Skin: Positive for color change and wound.  Neurological: Positive for numbness. Negative for weakness.  All other systems reviewed and are negative.    Allergies  Review of patient's allergies indicates no known allergies.  Home Medications   Current Outpatient Rx  Name Route Sig Dispense Refill  . AMLODIPINE BESYLATE 5 MG PO TABS Oral Take 5 mg by mouth daily.    . ASPIRIN 81 MG PO TABS Oral Take 81 mg by mouth daily.    Marland Kitchen CIPROFLOXACIN HCL 500 MG PO TABS Oral Take 1 tablet (500 mg total) by mouth 2 (two) times daily. 20 tablet 0  . FERROUS FUMARATE 325 (106 FE) MG PO TABS  Oral Take 1 tablet by mouth.    . FUROSEMIDE 20 MG PO TABS Oral Take 20 mg by mouth 2 (two) times daily.    . INSULIN GLARGINE 100 UNIT/ML [place] SOLN Subcutaneous Inject 10-15 Units into the skin 2 (two) times daily.     . INSULIN LISPRO (HUMAN) 100 UNIT/ML [place] SOLN Subcutaneous Inject 5-7 Units into the skin 3 (three) times daily before meals.     Marland Kitchen LISINOPRIL 2.5 MG PO TABS Oral Take 2.5 mg by mouth daily.    Marland Kitchen METRONIDAZOLE 500 MG PO TABS Oral Take 1 tablet (500 mg total) by mouth 2 (two) times daily. 20 tablet 0  . PRAVASTATIN SODIUM 20 MG PO TABS Oral Take 20 mg by mouth daily.    . TRAMADOL HCL 50 MG PO TABS Oral Take 50 mg by mouth every 6 (six) hours as needed. Patient uses this medication for pain.      BP  147/81  Pulse 76  Temp 98.3 F (36.8 C) (Oral)  Resp 16  Ht 5\' 8"  (1.727 m)  Wt 130 lb (58.968 kg)  BMI 19.77 kg/m2  SpO2 100%  Physical Exam  Nursing note and vitals reviewed. Constitutional: She is oriented to person, place, and time. She appears well-developed and well-nourished. No distress.  Cardiovascular: Normal rate and regular rhythm.   Pulmonary/Chest: Effort normal. No respiratory distress.  Musculoskeletal: Normal range of motion. She exhibits edema. She exhibits no tenderness.       Left ankle: She exhibits normal range of motion.       Feet:       2 + pitting edema located at the LLE, mild erythema and heat greater on the left than the right lower extremity. Ulcer located on the left lower extremity on medial foot.  2+ DP on left.  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  Neurological: She is alert and oriented to person, place, and time.  Skin: Skin is warm and dry. No rash noted. She is not diaphoretic. There is erythema (Located at the left lower extremity. ).  Psychiatric: She has a normal mood and affect.    ED Course  Procedures (including critical care time)  DIAGNOSTIC STUDIES: Oxygen Saturation is 100% on room air, normal by my interpretation.    COORDINATION OF CARE:    12:53AM- Possible hospital admission, x-ray, and IV antibiotics discussed. Pt agrees to treatment.    Labs Reviewed  CBC WITH DIFFERENTIAL - Abnormal; Notable for the following:    RBC 3.72 (*)     Hemoglobin 9.3 (*)     HCT 29.1 (*)     MCH 25.0 (*)     Platelets 480 (*)     All other components within normal limits  BASIC METABOLIC PANEL - Abnormal; Notable for the following:    Glucose, Bld 246 (*)     BUN 24 (*)     All other components within normal limits   Dg Foot Complete Left  12/11/2011  *RADIOLOGY REPORT*  Clinical Data: Wound to the medial side of the left foot near the calcaneus.  Wound infection.  LEFT FOOT - COMPLETE 3+ VIEW  Comparison: 10/09/2011  Findings: Diffuse  bone demineralization.  Degenerative changes in the tarsal, tarsometatarsal, and metatarsal phalangeal joints. Suggestion of periarticular erosions in the first and second metatarsal phalangeal joints.  Old fractures again demonstrated in the second and third proximal metatarsals, navicular, and medial cuneiform.  Vascular calcifications.  Mild pes planus.  No evidence of acute bone  destruction or sclerosis or periosteal reaction in the calcaneus.  Nothing to suggest osteomyelitis.  IMPRESSION: No plain film evidence of osteomyelitis.  Degenerative changes in the tarsal and metatarsal phalangeal joints with old fracture deformities, stable since previous study.  Suggestion of periarticular erosions in the first and second metatarsal phalangeal joints.   Original Report Authenticated By: Neale Burly, M.D.      1. Diabetic foot ulcer   2. Diabetes mellitus       MDM  I personally performed the services described in this documentation, which was scribed in my presence. The recorded information has been reviewed and considered.  Pt recommended to be admitted for poorly healing wound and developing cellulitis.  Plain film shows no gross osteomyelitis.  Pt told admission would give multiple doses of IV abx, wound care, and referrals to wound care center and proper wound care, and possibly home IV abx.  Pt declines, is willing to take risk, understands that risks include worsening infection, bone infection, loss of foot or leg, sepsis.  Pt given wound care, IV abx, Rx for abx and refer to wound care center.    [name]. Nancy Mai, MD 12/11/11 [phone]

## 2011-12-16 ENCOUNTER — Emergency Department (HOSPITAL_COMMUNITY)
Admission: EM | Admit: 2011-12-16 | Discharge: 2011-12-17 | Disposition: A | Discharge status: Home / Self Care | Payer: Medicaid Other | Attending: Emergency Medicine | Admitting: Emergency Medicine

## 2011-12-16 ENCOUNTER — Encounter (HOSPITAL_COMMUNITY): Payer: Self-pay | Admitting: Emergency Medicine

## 2011-12-16 DIAGNOSIS — L97509 Non-pressure chronic ulcer of other part of unspecified foot with unspecified severity: Secondary | ICD-10-CM

## 2011-12-16 DIAGNOSIS — E7800 Pure hypercholesterolemia, unspecified: Secondary | ICD-10-CM | POA: Insufficient documentation

## 2011-12-16 DIAGNOSIS — I10 Essential (primary) hypertension: Secondary | ICD-10-CM | POA: Insufficient documentation

## 2011-12-16 DIAGNOSIS — D649 Anemia, unspecified: Secondary | ICD-10-CM | POA: Insufficient documentation

## 2011-12-16 DIAGNOSIS — Z794 Long term (current) use of insulin: Secondary | ICD-10-CM | POA: Insufficient documentation

## 2011-12-16 DIAGNOSIS — L97409 Non-pressure chronic ulcer of unspecified heel and midfoot with unspecified severity: Secondary | ICD-10-CM | POA: Insufficient documentation

## 2011-12-16 DIAGNOSIS — Z79899 Other long term (current) drug therapy: Secondary | ICD-10-CM | POA: Insufficient documentation

## 2011-12-16 DIAGNOSIS — E11621 Type 2 diabetes mellitus with foot ulcer: Secondary | ICD-10-CM

## 2011-12-16 DIAGNOSIS — Z87891 Personal history of nicotine dependence: Secondary | ICD-10-CM | POA: Insufficient documentation

## 2011-12-16 DIAGNOSIS — E1169 Type 2 diabetes mellitus with other specified complication: Secondary | ICD-10-CM | POA: Insufficient documentation

## 2011-12-16 NOTE — ED Notes (Signed)
Pt alert, arrives from home, c/o "gaping wound" to left foot, onset several moths ago, pt ambulates to triage, 1 cm raised open area noted, DSD applied

## 2011-12-17 LAB — BASIC METABOLIC PANEL
BUN: 26 mg/dL — ABNORMAL HIGH (ref 6–23)
Chloride: 101 mEq/L (ref 96–112)
GFR calc Af Amer: 69 mL/min — ABNORMAL LOW (ref >90)
GFR calc non Af Amer: 60 mL/min — ABNORMAL LOW (ref >90)
Glucose, Bld: 212 mg/dL — ABNORMAL HIGH (ref 70–99)
Potassium: 4.8 mEq/L (ref 3.5–5.1)
Sodium: 134 mEq/L — ABNORMAL LOW (ref 135–145)

## 2011-12-17 LAB — CBC WITH DIFFERENTIAL/PLATELET
Eosinophils Absolute: 0.1 10*3/uL (ref 0.0–0.7)
Hemoglobin: 9.6 g/dL — ABNORMAL LOW (ref 12.0–15.0)
Lymphs Abs: 2.1 10*3/uL (ref 0.7–4.0)
MCH: 24.6 pg — ABNORMAL LOW (ref 26.0–34.0)
Monocytes Relative: 10 % (ref 3–12)
Neutro Abs: 5.9 10*3/uL (ref 1.7–7.7)
Neutrophils Relative %: 66 % (ref 43–77)
Platelets: 468 10*3/uL — ABNORMAL HIGH (ref 150–400)
RBC: 3.9 MIL/uL (ref 3.87–5.11)
WBC: 9 10*3/uL (ref 4.0–10.5)

## 2011-12-17 MED ORDER — SODIUM CHLORIDE 0.9 % IV BOLUS (SEPSIS)
1000.0000 mL | Freq: Once | INTRAVENOUS | Status: AC
  Administered 2011-12-17: 1000 mL via INTRAVENOUS

## 2011-12-17 MED ORDER — METRONIDAZOLE IN NACL 5-0.79 MG/ML-% IV SOLN
500.0000 mg | Freq: Once | INTRAVENOUS | Status: AC
  Administered 2011-12-17: 500 mg via INTRAVENOUS
  Filled 2011-12-17: qty 100

## 2011-12-17 MED ORDER — CIPROFLOXACIN IN D5W 400 MG/200ML IV SOLN
400.0000 mg | Freq: Once | INTRAVENOUS | Status: AC
  Administered 2011-12-17: 400 mg via INTRAVENOUS
  Filled 2011-12-17: qty 200

## 2011-12-17 NOTE — ED Provider Notes (Signed)
History     CSN: [id number]  Arrival date & time 12/16/11  2202   First MD Initiated Contact with Patient 12/17/11 0258      Chief Complaint  Patient presents with  . Wound Infection   HPI  History provided by the patient and recent medical chart. Patient is a 41 year old female with history of hypertension, diabetes and neuropathy who presents for reevaluation of chronic left ulceration. Patient was seen in emergency room 6 days ago for similar symptoms. She was given IV antibiotics and offered admission but she refused. [facility] and currently does not have a PCP. She is not try to follow up with any outpatient specialist for her foot ulcer. She denies any significant changes in the ulcer. There is no bleeding or drainage. She reports decreased sensation to the foot and no significant pains per her baseline neuropathy. She denies any erythematous streaks up the leg. Denies any fever, fatigue or sweats.    Past Medical History  Diagnosis Date  . Diabetes mellitus   . Neuropathy in diabetes   . Hypertension   . Hypercholesteremia   . H/O seasonal allergies   . Anemia   . Left foot infection     Past Surgical History  Procedure Date  . Cesarean section   . Eye surgery   . Hemrrhoidectomy   . Peripherally inserted central catheter insertion     No family history on file.  History  Substance Use Topics  . Smoking status: Former Smoker    Quit date: 05/16/2008  . Smokeless tobacco: Not on file  . Alcohol Use: 0.5 oz/week    1 drink(s) per week     Occasional    OB History    Grav Para Term Preterm Abortions TAB SAB Ect Mult Living                  Review of Systems  Constitutional: Positive for chills. Negative for fever, diaphoresis and fatigue.  Musculoskeletal: Negative for back pain.  Neurological: Negative for headaches.    Allergies  Review of patient's allergies indicates no known allergies.  Home Medications    Current Outpatient Rx  Name Route Sig Dispense Refill  . AMLODIPINE BESYLATE 5 MG PO TABS Oral Take 5 mg by mouth daily.    Marland Kitchen CIPROFLOXACIN HCL 500 MG PO TABS Oral Take 1 tablet (500 mg total) by mouth 2 (two) times daily. 20 tablet 0  . FUROSEMIDE 20 MG PO TABS Oral Take 20 mg by mouth 2 (two) times daily.    . INSULIN GLARGINE 100 UNIT/ML [place] SOLN Subcutaneous Inject 10 Units into the skin 2 (two) times daily.     . INSULIN LISPRO (HUMAN) 100 UNIT/ML [place] SOLN Subcutaneous Inject 2-7 Units into the skin 3 (three) times daily before meals.     Marland Kitchen LISINOPRIL 2.5 MG PO TABS Oral Take 2.5 mg by mouth daily.    Marland Kitchen METRONIDAZOLE 500 MG PO TABS Oral Take 1 tablet (500 mg total) by mouth 2 (two) times daily. 20 tablet 0  . PRAVASTATIN SODIUM 20 MG PO TABS Oral Take 20 mg by mouth daily.    . TRAMADOL HCL 50 MG PO TABS Oral Take 50 mg by mouth every 6 (six) hours as needed. Patient uses this medication for pain.      BP 124/78  Pulse 87  Temp 98.5 F (36.9 C) (Oral)  Resp 18  SpO2 100%  Physical Exam  Nursing  note and vitals reviewed. Constitutional: She is oriented to person, place, and time. She appears well-developed and well-nourished. No distress.  HENT:  Head: Normocephalic.  Neck: Normal range of motion. Neck supple.       No meningeal signs  Cardiovascular: Normal rate and regular rhythm.   No murmur heard. Pulmonary/Chest: Effort normal and breath sounds normal. No respiratory distress. She has no wheezes. She has no rales.  Musculoskeletal:       Feet:       2+ pitting edema of left foot and ankle up to the shin. There is old appearing ulceration to the medial aspect of the midfoot.. Mild erythema. No significant induration. No bleeding or drainage. Patient states she has chronic baseline decreased sensation and numbness to the foot. Sensation is unchanged. Normal dorsal pedal pulses and cap refill in toes. Normal movement of toes and ankle. No erythematous streaks up the leg.   Neurological: She is alert and oriented to person, place, and time.  Skin: Skin is warm and dry. No rash noted.  Psychiatric: She has a normal mood and affect. Her behavior is normal.    ED Course  Procedures   Results for orders placed during the hospital encounter of 12/16/11  CBC WITH DIFFERENTIAL      Component Value Range   WBC 9.0  4.0 - 10.5 K/uL   RBC 3.90  3.87 - 5.11 MIL/uL   Hemoglobin 9.6 (*) 12.0 - 15.0 g/dL   HCT 30.5 (*) 36.0 - 46.0 %   MCV 78.2  78.0 - 100.0 fL   MCH 24.6 (*) 26.0 - 34.0 pg   MCHC 31.5  30.0 - 36.0 g/dL   RDW 13.8  11.5 - 15.5 %   Platelets 468 (*) 150 - 400 K/uL   Neutrophils Relative 66  43 - 77 %   Neutro Abs 5.9  1.7 - 7.7 K/uL   Lymphocytes Relative 23  12 - 46 %   Lymphs Abs 2.1  0.7 - 4.0 K/uL   Monocytes Relative 10  3 - 12 %   Monocytes Absolute 0.9  0.1 - 1.0 K/uL   Eosinophils Relative 1  0 - 5 %   Eosinophils Absolute 0.1  0.0 - 0.7 K/uL   Basophils Relative 0  0 - 1 %   Basophils Absolute 0.0  0.0 - 0.1 K/uL  BASIC METABOLIC PANEL      Component Value Range   Sodium 134 (*) 135 - 145 mEq/L   Potassium 4.8  3.5 - 5.1 mEq/L   Chloride 101  96 - 112 mEq/L   CO2 21  19 - 32 mEq/L   Glucose, Bld 212 (*) 70 - 99 mg/dL   BUN 26 (*) 6 - 23 mg/dL   Creatinine, Ser 1.13 (*) 0.50 - 1.10 mg/dL   Calcium 8.4  8.4 - 10.5 mg/dL   GFR calc non Af Amer 60 (*) >90 mL/min   GFR calc Af Amer 69 (*) >90 mL/min       1. Diabetic foot ulcers       MDM  3:00 AM patient seen and evaluated. Patient appears well and nontoxic. Patient denies any significant changes in swelling of lower leg and chronic ulcer.   Pt discussed in sign out with [facility] and Dr. Sharol Given.  They will follow results of sed rate and make clinical decision for dispo.     [place], [place] 12/17/11 [phone]

## 2011-12-17 NOTE — ED Provider Notes (Signed)
Medical screening examination/treatment/procedure(s) were conducted as a shared visit with non-physician practitioner(s) and myself.  I personally evaluated the patient during the encounter. Patient with history of nonhealing wound on left foot, seen in the ER for same. No signs of acute infection, plan for outpatient antibiotics and followup with wound care.  Kalman Drape, MD 12/17/11 [phone]

## 2011-12-17 NOTE — ED Provider Notes (Signed)
Care of [place] is assumed from [employer], [place].  Pt states she returned today for admission and the IV antibiotics that she refused on 12/10/10.  She did not follow-up with wound care as instructed.  No indication of cellulitis, no purulent drainage.  Awaiting results of ESR.  If WNL, will d/c home for wound care f/u.  ESR <70, unlikely to be osteomyelitis.  I will discharge home and have her follow up with wound care.   I have discussed this with the patient.  I have also discussed reasons to return immediately to the ER.  Patient expresses understanding and agrees with plan.  1. Medications: continue home antibiotics 2. Treatment: take antibiotics as directed, keep wound clean, wet to dry dressing changes twice a day 3. Follow Up: with wound care this week     Abigail Butts, PA-C 12/17/11 [id number]

## 2011-12-17 NOTE — ED Provider Notes (Signed)
Medical screening examination/treatment/procedure(s) were conducted as a shared visit with non-physician practitioner(s) and myself.  I personally evaluated the patient during the encounter. Patient with history of nonhealing wound on left foot, seen in the ER for same. No signs of acute infection, plan for outpatient antibiotics and followup with wound care.  Kalman Drape, MD 12/17/11 [phone]

## 2011-12-17 NOTE — ED Notes (Signed)
Bed:WA23<BR> Expected date:<BR> Expected time:<BR> Means of arrival:<BR> Comments:<BR>

## 2011-12-20 ENCOUNTER — Encounter (HOSPITAL_BASED_OUTPATIENT_CLINIC_OR_DEPARTMENT_OTHER): Payer: Medicaid Other | Attending: Internal Medicine

## 2011-12-20 DIAGNOSIS — E1069 Type 1 diabetes mellitus with other specified complication: Secondary | ICD-10-CM | POA: Insufficient documentation

## 2011-12-20 DIAGNOSIS — L97509 Non-pressure chronic ulcer of other part of unspecified foot with unspecified severity: Secondary | ICD-10-CM | POA: Insufficient documentation

## 2011-12-20 DIAGNOSIS — E1142 Type 2 diabetes mellitus with diabetic polyneuropathy: Secondary | ICD-10-CM | POA: Insufficient documentation

## 2011-12-20 DIAGNOSIS — I10 Essential (primary) hypertension: Secondary | ICD-10-CM | POA: Insufficient documentation

## 2011-12-20 DIAGNOSIS — E1049 Type 1 diabetes mellitus with other diabetic neurological complication: Secondary | ICD-10-CM | POA: Insufficient documentation

## 2011-12-20 DIAGNOSIS — E785 Hyperlipidemia, unspecified: Secondary | ICD-10-CM | POA: Insufficient documentation

## 2011-12-20 LAB — GLUCOSE, CAPILLARY: Glucose-Capillary: 547 mg/dL — ABNORMAL HIGH (ref 70–99)

## 2011-12-20 NOTE — Progress Notes (Signed)
[facility]  NAME:  [name], HEPHNER NO.:  [id number]  MEDICAL RECORD NO.:  [id number]      DATE OF BIRTH:  January 19, 1971  PHYSICIAN:  Ricard Dillon, M.D. VISIT DATE:  12/20/2011                                  OFFICE VISIT   FACILITY:  [facility].  Ms. Altenhofen is a 41 year old type 1 diabetic, who tells me that she has a wound on her foot for the last 2 months.  This started as a blister, gradually opened.  She had a wound.  She presented to the ER at [facility] with this wound and swelling of the area extending up into her foot.  She does have a prior ulcer in the left foot over the first metatarsal head that she tells me necessitated her going in the hospital for IV antibiotics roughly a year ago.  She apparently may have had a total contact cast, although she has not been seen in this clinic before.  In any case on arrival to the ER, she had 2+ pitting edema, mild erythema and heat generated on the left foot.  Her hemoglobin was 9.3, platelet count 480, glucose 246.  X-ray of the foot showed a pes planus deformity.  She had several fractures with displacement again noted.  This included the base of the 3rd metatarsal navicular and probably the medial cuneiform.  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  I have looked into the system.  I do not see that a culture was done.  She was started on doxycycline.  PAST MEDICAL HISTORY:  Type 1 diabetes since age 28, diabetic neuropathy, hypertension, hyperlipidemia, anemia, recent left foot infection.  SURGICAL HISTORY:  A cesarean section, hemorrhoidectomy.  MEDICATION LIST:  Reviewed.  She is on amlodipine 5 daily, ASA 81 daily, ferrous sulfate 325 daily, Lasix 20 b.i.d., Lantus insulin 10-15 units twice a day, Humulin 5-7 units 3 times daily.  PHYSICAL EXAMINATION:  VITAL SIGNS:  Temperature 97.7, pulse 90, respirations 14, blood pressure 130/83.  Her CBG testing was  done here first at 547, second at 83. RESPIRATORY:  Clear air entry bilaterally.  CARDIAC:  Heart sounds were normal.  She is not dehydrated. EXTREMITIES:  Peripheral pulses were palpable at the posterior tibial bilaterally.  She has obvious Charcot deformity in the left foot.  The wound in question is on the medial aspect of the left foot measuring 1 x 0.7 x 0.2.  This underwent a light surface debridement.  The wound does not look ominous.  There is no surrounding erythema.  If there was edema here 9 days ago, this is a lot better on doxycycline.  There is really no overt infection here.  IMPRESSION:  [name] grade 2 wound of the left foot in the setting of a Charcot deformity in a poorly controlled type 1 diabetic.  We dressed this wound with silver alginate foam, Kerlix and net.  She can change this once before she sees [place] again.  She tells me that she has had on offloading Cam boot at home which she wore during the wound on the left plantar foot a year ago.  We advised her to use this, gave her a healing sandal to go out of the clinic today.  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  The wound is going to need to be offloaded to ensure successful healing.  I did extend her doxycycline for another week given the description in the ER.  This area has considerably improved on this antibiotic.  Finally, I talked to her about her very high blood sugars.  I offered to look at her CBGs and adjust her insulin myself.  However she tells me she is followed by Dr. Dwyane Dee of Endocrinology, therefore I have asked her to make an appointment before she sees me next week.          ______________________________ Ricard Dillon, M.D.     MGR/MEDQ  D:  12/20/2011  T:  12/20/2011  Job:  [id number]

## 2012-01-03 ENCOUNTER — Encounter (HOSPITAL_BASED_OUTPATIENT_CLINIC_OR_DEPARTMENT_OTHER): Payer: Medicaid Other | Attending: Internal Medicine

## 2012-01-03 DIAGNOSIS — E1169 Type 2 diabetes mellitus with other specified complication: Secondary | ICD-10-CM | POA: Insufficient documentation

## 2012-01-03 DIAGNOSIS — L97509 Non-pressure chronic ulcer of other part of unspecified foot with unspecified severity: Secondary | ICD-10-CM | POA: Insufficient documentation

## 2012-01-17 ENCOUNTER — Encounter (HOSPITAL_BASED_OUTPATIENT_CLINIC_OR_DEPARTMENT_OTHER): Payer: Medicaid Other

## 2012-02-01 DIAGNOSIS — E7800 Pure hypercholesterolemia, unspecified: Secondary | ICD-10-CM | POA: Insufficient documentation

## 2012-02-01 DIAGNOSIS — Z87891 Personal history of nicotine dependence: Secondary | ICD-10-CM | POA: Insufficient documentation

## 2012-02-01 DIAGNOSIS — Z79899 Other long term (current) drug therapy: Secondary | ICD-10-CM | POA: Insufficient documentation

## 2012-02-01 DIAGNOSIS — I10 Essential (primary) hypertension: Secondary | ICD-10-CM | POA: Insufficient documentation

## 2012-02-01 DIAGNOSIS — E1149 Type 2 diabetes mellitus with other diabetic neurological complication: Secondary | ICD-10-CM | POA: Insufficient documentation

## 2012-02-01 DIAGNOSIS — E1169 Type 2 diabetes mellitus with other specified complication: Secondary | ICD-10-CM | POA: Insufficient documentation

## 2012-02-01 DIAGNOSIS — Z794 Long term (current) use of insulin: Secondary | ICD-10-CM | POA: Insufficient documentation

## 2012-02-01 DIAGNOSIS — E1142 Type 2 diabetes mellitus with diabetic polyneuropathy: Secondary | ICD-10-CM | POA: Insufficient documentation

## 2012-02-01 DIAGNOSIS — D649 Anemia, unspecified: Secondary | ICD-10-CM | POA: Insufficient documentation

## 2012-02-01 DIAGNOSIS — L98499 Non-pressure chronic ulcer of skin of other sites with unspecified severity: Secondary | ICD-10-CM | POA: Insufficient documentation

## 2012-02-02 ENCOUNTER — Encounter (HOSPITAL_BASED_OUTPATIENT_CLINIC_OR_DEPARTMENT_OTHER): Payer: Self-pay | Admitting: *Deleted

## 2012-02-02 ENCOUNTER — Emergency Department (HOSPITAL_COMMUNITY)
Admission: EM | Admit: 2012-02-02 | Discharge: 2012-02-02 | Disposition: A | Discharge status: Home / Self Care | Payer: Medicaid Other | Source: Home / Self Care | Attending: Emergency Medicine | Admitting: Emergency Medicine

## 2012-02-02 ENCOUNTER — Emergency Department (HOSPITAL_BASED_OUTPATIENT_CLINIC_OR_DEPARTMENT_OTHER)
Admission: EM | Admit: 2012-02-02 | Discharge: 2012-02-02 | Disposition: A | Discharge status: Home / Self Care | Payer: Medicaid Other | Attending: Emergency Medicine | Admitting: Emergency Medicine

## 2012-02-02 ENCOUNTER — Encounter (HOSPITAL_COMMUNITY): Payer: Self-pay | Admitting: *Deleted

## 2012-02-02 ENCOUNTER — Emergency Department (HOSPITAL_BASED_OUTPATIENT_CLINIC_OR_DEPARTMENT_OTHER): Payer: Medicaid Other

## 2012-02-02 DIAGNOSIS — E11621 Type 2 diabetes mellitus with foot ulcer: Secondary | ICD-10-CM

## 2012-02-02 DIAGNOSIS — Z87891 Personal history of nicotine dependence: Secondary | ICD-10-CM | POA: Insufficient documentation

## 2012-02-02 DIAGNOSIS — S91309A Unspecified open wound, unspecified foot, initial encounter: Secondary | ICD-10-CM

## 2012-02-02 DIAGNOSIS — Z794 Long term (current) use of insulin: Secondary | ICD-10-CM | POA: Insufficient documentation

## 2012-02-02 DIAGNOSIS — E1169 Type 2 diabetes mellitus with other specified complication: Secondary | ICD-10-CM | POA: Insufficient documentation

## 2012-02-02 DIAGNOSIS — E1142 Type 2 diabetes mellitus with diabetic polyneuropathy: Secondary | ICD-10-CM | POA: Insufficient documentation

## 2012-02-02 DIAGNOSIS — L97509 Non-pressure chronic ulcer of other part of unspecified foot with unspecified severity: Secondary | ICD-10-CM

## 2012-02-02 DIAGNOSIS — Z79899 Other long term (current) drug therapy: Secondary | ICD-10-CM | POA: Insufficient documentation

## 2012-02-02 DIAGNOSIS — E7800 Pure hypercholesterolemia, unspecified: Secondary | ICD-10-CM | POA: Insufficient documentation

## 2012-02-02 DIAGNOSIS — I10 Essential (primary) hypertension: Secondary | ICD-10-CM | POA: Insufficient documentation

## 2012-02-02 DIAGNOSIS — E119 Type 2 diabetes mellitus without complications: Secondary | ICD-10-CM

## 2012-02-02 DIAGNOSIS — E1149 Type 2 diabetes mellitus with other diabetic neurological complication: Secondary | ICD-10-CM | POA: Insufficient documentation

## 2012-02-02 LAB — CBC WITH DIFFERENTIAL/PLATELET
Basophils Absolute: 0 10*3/uL (ref 0.0–0.1)
Basophils Relative: 0 % (ref 0–1)
Hemoglobin: 10.1 g/dL — ABNORMAL LOW (ref 12.0–15.0)
Lymphocytes Relative: 17 % (ref 12–46)
MCHC: 32.2 g/dL (ref 30.0–36.0)
Neutro Abs: 6.6 10*3/uL (ref 1.7–7.7)
Neutrophils Relative %: 74 % (ref 43–77)
RDW: 14.9 % (ref 11.5–15.5)
WBC: 8.9 10*3/uL (ref 4.0–10.5)

## 2012-02-02 LAB — BASIC METABOLIC PANEL
CO2: 19 mEq/L (ref 19–32)
Chloride: 97 mEq/L (ref 96–112)
GFR calc Af Amer: 81 mL/min — ABNORMAL LOW (ref >90)
Potassium: 4.9 mEq/L (ref 3.5–5.1)

## 2012-02-02 LAB — GLUCOSE, CAPILLARY
Glucose-Capillary: >600 mg/dL (ref 70–99)
Glucose-Capillary: 286 mg/dL — ABNORMAL HIGH (ref 70–99)

## 2012-02-02 LAB — PREGNANCY, URINE: Preg Test, Ur: NEGATIVE

## 2012-02-02 MED ORDER — INSULIN REGULAR HUMAN 100 UNIT/ML IJ SOLN
INTRAMUSCULAR | Status: AC
  Filled 2012-02-02: qty 4

## 2012-02-02 MED ORDER — VANCOMYCIN HCL IN DEXTROSE 1-5 GM/200ML-% IV SOLN
1000.0000 mg | Freq: Once | INTRAVENOUS | Status: AC
  Administered 2012-02-02: 1000 mg via INTRAVENOUS
  Filled 2012-02-02: qty 200

## 2012-02-02 MED ORDER — CLINDAMYCIN HCL 300 MG PO CAPS: 150.0000 mg | ORAL_CAPSULE | Freq: Three times a day (TID) | ORAL | Status: DC

## 2012-02-02 MED ORDER — SODIUM CHLORIDE 0.9 % IV BOLUS (SEPSIS)
2000.0000 mL | Freq: Once | INTRAVENOUS | Status: AC
  Administered 2012-02-02: 2000 mL via INTRAVENOUS

## 2012-02-02 MED ORDER — INSULIN REGULAR HUMAN 100 UNIT/ML IJ SOLN
4.0000 [IU] | Freq: Once | INTRAMUSCULAR | Status: AC
  Administered 2012-02-02: 4 [IU] via SUBCUTANEOUS

## 2012-02-02 NOTE — ED Notes (Signed)
Pt c/o left foot diabetic ulcer x 1 week

## 2012-02-02 NOTE — ED Notes (Signed)
Critical glucose value called from lab, glucose 615 Pam RN made aware and Dr. Lilia Pro also made aware

## 2012-02-02 NOTE — ED Provider Notes (Signed)
History     CSN: [id number]  Arrival date & time 02/01/12  2357   First MD Initiated Contact with Patient 02/02/12 0050      Chief Complaint  Patient presents with  . Wound Infection    (Consider location/radiation/quality/duration/timing/severity/associated sxs/prior treatment) The history is provided by the patient.  Patient with poorly controlled DM with sugars varying for 67-600 daily and a h/o of osteomyelitis on the left foot who was just discharged from wound care this week for recurrent DM ulceration of the left instep presents complaining of worsening ulceration of the instep of the left foot.  No f/c/r.  No streaking.  No n/v/d.  No trauma.  No cough.  Has not followed up with PMD as she has no PMD.    Past Medical History  Diagnosis Date  . Diabetes mellitus   . Neuropathy in diabetes   . Hypertension   . Hypercholesteremia   . H/O seasonal allergies   . Anemia   . Left foot infection     Past Surgical History  Procedure Date  . Cesarean section   . Eye surgery   . Hemrrhoidectomy   . Peripherally inserted central catheter insertion     History reviewed. No pertinent family history.  History  Substance Use Topics  . Smoking status: Former Smoker    Quit date: 05/16/2008  . Smokeless tobacco: Not on file  . Alcohol Use: 0.5 oz/week    1 drink(s) per week     Occasional    OB History    Grav Para Term Preterm Abortions TAB SAB Ect Mult Living                  Review of Systems  Constitutional: Negative for fever.  Respiratory: Negative for shortness of breath.   Gastrointestinal: Negative for vomiting and diarrhea.  Skin: Positive for wound.  All other systems reviewed and are negative.    Allergies  Review of patient's allergies indicates no known allergies.  Home Medications   Current Outpatient Rx  Name Route Sig Dispense Refill  . AMLODIPINE BESYLATE 5 MG PO TABS Oral Take 5 mg by mouth daily.    . FUROSEMIDE 20 MG PO TABS Oral Take  20 mg by mouth 2 (two) times daily.    . INSULIN GLARGINE 100 UNIT/ML [place] SOLN Subcutaneous Inject 10 Units into the skin 2 (two) times daily.     . INSULIN LISPRO (HUMAN) 100 UNIT/ML [place] SOLN Subcutaneous Inject 2-7 Units into the skin 3 (three) times daily before meals.     Marland Kitchen LISINOPRIL 2.5 MG PO TABS Oral Take 2.5 mg by mouth daily.    Marland Kitchen PRAVASTATIN SODIUM 20 MG PO TABS Oral Take 20 mg by mouth daily.    . TRAMADOL HCL 50 MG PO TABS Oral Take 50 mg by mouth every 6 (six) hours as needed. Patient uses this medication for pain.      BP 138/87  Pulse 84  Temp 99 F (37.2 C) (Oral)  Resp 16  Ht 5\' 8"  (1.727 m)  Wt 120 lb (54.432 kg)  BMI 18.25 kg/m2  SpO2 98%  Physical Exam  Constitutional: She is oriented to person, place, and time. She appears well-developed and well-nourished. No distress.  HENT:  Head: Normocephalic and atraumatic.  Mouth/Throat: Oropharynx is clear and moist.  Eyes: Conjunctivae normal are normal. Pupils are equal, round, and reactive to light.  Neck: Normal range of motion. Neck supple.  Cardiovascular: Normal rate and regular  rhythm.   Pulmonary/Chest: Effort normal and breath sounds normal. She has no wheezes. She has no rales.  Abdominal: Soft. Bowel sounds are normal. There is no tenderness. There is no rebound and no guarding.  Musculoskeletal: Normal range of motion. She exhibits edema.       ~ 1 inch ulceration with drainage on the instep of the left foot surrounding erythema and warmth.  Purulent drainage  Neurological: She is alert and oriented to person, place, and time.  Skin: Skin is warm and dry. There is erythema.  Psychiatric: She has a normal mood and affect.    ED Course  Procedures (including critical care time)   Labs Reviewed  CBC WITH DIFFERENTIAL  BASIC METABOLIC PANEL  PREGNANCY, URINE   No results found.   No diagnosis found.    MDM  No DKA.  3 L NSS given with IV insulin and IV vancomycin.  Purulent diabetic wound with a  h/o osteomyelitis. Will need admission and IV antibiotics in light of ongoing nature with worsening and poorly controlled sugars that patient reports fluctuate from 67- > 600. Patient has decision making to refuse care stating she will follow up with her wound care doctors and is refusing admission.  Patient understands that the risks of signing out against medical advice are but are not limited to: death, sepsis, gangrene of the foot with out without loss of the foot and ongoing pain and morbidity.  Patient verbalizes understanding of risks with witness Pam (nurse) present and signs AMA.  She is welcomed to return at any time   MDM Reviewed: previous chart and vitals Reviewed previous: labs, x-ray and MRI Interpretation: labs and x-ray        Rever Pichette Alfonso Patten, MD 02/02/12 [phone]

## 2012-02-02 NOTE — ED Provider Notes (Signed)
History     CSN: [id number]  Arrival date & time 02/02/12  2105   First MD Initiated Contact with Patient 02/02/12 2126      Chief Complaint  Patient presents with  . Wound Infection    (Consider location/radiation/quality/duration/timing/severity/associated sxs/prior treatment) HPI History provided by pt and prior chart.  Pt reports chronic diabetic ulcer of left medial foot x 3 months.  Was treated at wound center for 1 month and at the time she was released, her wound looked like a superficial abrasion.  Approx 7 days ago, she began to develop increasing erythema around wound w/ associated clear drainage.  Wound became intermittently painful 3 days ago, particularly w/ weight-bearing.  Denies fever.   Per prior chart, pt has poorly controlled diabetes, osteomyelitis and diabetic neuropathy.  Does not currently have a PCP.  Has been seen in the ED multiple times for this particular wound, most recently early this morning at [place].  At that time, BG >600 w/out acidosis (treated w/ IV fluids and insulin), wound thought to be acutely infected w/out underlying osteomyelitis, pt received one dose of IV Vanc, refused admission and left AMA.  She returns today for admission.    Past Medical History  Diagnosis Date  . Diabetes mellitus   . Neuropathy in diabetes   . Hypertension   . Hypercholesteremia   . H/O seasonal allergies   . Anemia   . Left foot infection     Past Surgical History  Procedure Date  . Cesarean section   . Eye surgery   . Hemrrhoidectomy   . Peripherally inserted central catheter insertion     History reviewed. No pertinent family history.  History  Substance Use Topics  . Smoking status: Former Smoker    Quit date: 05/16/2008  . Smokeless tobacco: Not on file  . Alcohol Use: 0.5 oz/week    1 drink(s) per week     Occasional    OB History    Grav Para Term Preterm Abortions TAB SAB Ect Mult Living                  Review of Systems  All  other systems reviewed and are negative.    Allergies  Review of patient's allergies indicates no known allergies.  Home Medications   Current Outpatient Rx  Name Route Sig Dispense Refill  . AMLODIPINE BESYLATE 5 MG PO TABS Oral Take 5 mg by mouth daily.    . ASPIRIN 325 MG PO TABS Oral Take 325 mg by mouth daily.    . FUROSEMIDE 20 MG PO TABS Oral Take 20 mg by mouth daily.     . INSULIN GLARGINE 100 UNIT/ML [place] SOLN Subcutaneous Inject 10 Units into the skin 2 (two) times daily.     . INSULIN LISPRO (HUMAN) 100 UNIT/ML [place] SOLN Subcutaneous Inject 2-7 Units into the skin 3 (three) times daily before meals. Pt on sliding scale    . LISINOPRIL 2.5 MG PO TABS Oral Take 2.5 mg by mouth daily.    Marland Kitchen PRAVASTATIN SODIUM 20 MG PO TABS Oral Take 20 mg by mouth daily.    . TRAMADOL HCL 50 MG PO TABS Oral Take 50 mg by mouth every 6 (six) hours as needed. Patient uses this medication for pain.      BP 144/95  Pulse 93  Temp 97.8 F (36.6 C) (Oral)  Resp 18  Ht 5\' 8"  (1.727 m)  Wt 138 lb 3.2 oz (62.687 kg)  BMI 21.01 kg/m2  SpO2 100%  LMP 01/08/2012  Physical Exam  Nursing note and vitals reviewed. Constitutional: She is oriented to person, place, and time. She appears well-developed and well-nourished. No distress.  HENT:  Head: Normocephalic and atraumatic.  Eyes:       Normal appearance  Neck: Normal range of motion.  Cardiovascular: Normal rate and regular rhythm.   Pulmonary/Chest: Effort normal and breath sounds normal. No respiratory distress.  Musculoskeletal: Normal range of motion.       2+ pitting edema bilateral lower legs.  2.5cm ulceration instep of left foot w/ surrounding erythema but no induration.  There is a small amt of clear drainage from wound.  Mildly ttp.    Neurological: She is alert and oriented to person, place, and time.  Skin: Skin is warm and dry. No rash noted.  Psychiatric: She has a normal mood and affect. Her behavior is normal.    ED Course    Procedures (including critical care time)  Labs Reviewed  GLUCOSE, CAPILLARY - Abnormal; Notable for the following:    Glucose-Capillary 158 (*)     All other components within normal limits   Dg Foot Complete Left  02/02/2012  *RADIOLOGY REPORT*  Clinical Data: Left medial foot ulcer for 1 week.  LEFT FOOT - COMPLETE 3+ VIEW  Comparison: 12/11/2011  Findings: Diffuse bone demineralization.  Periarticular erosive changes in the first and second metatarsal phalangeal joints.  Old fracture deformities in the second and third proximal metatarsal bones, navicular, and cuneiform bones.  Vascular calcifications. The past planus.  No significant change since previous study.  No evidence of any developing bone sclerosis, periosteal reaction, or bone destruction to suggest evidence of osteomyelitis.  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  No significant change since previous study.  No radiographic evidence of acute osteomyelitis.   Original Report Authenticated By: Lucienne Capers, M.D.      1. Diabetic foot ulcer       MDM  Pt seen for chronic diabetic foot ulcer in ED last night and declined admission for acute infection in setting of extreme hyperglycemia after receiving 1g IV vanc.   Returns this evening with same complaint.  BG 158 today,pt afebrile and wound w/out induration or purulent drainage.  Dr. Stevie Kern has examined and agrees that admission is not indicated at this time.  Pt prescribed clindamycin, advised to f/u with wound care and return to ER if sx worsen.          Remer Macho, [place] 02/04/12 2010

## 2012-02-02 NOTE — ED Provider Notes (Addendum)
Medical screening examination/treatment/procedure(s) were conducted as a shared visit with non-physician practitioner(s) and myself.  I personally evaluated the patient during the encounter  Chronic ulcer left medial foot skin had been healing over few months but then couple days after discharged by Wound Clinic Pt felt vessicle unroof and 1cm ulcer returned superficial partial thickness slight redness at border but no foul odor or pus, no bony pain, no fever or red streaks up leg; feel OutPt f/u reasonable and Pt agrees; baseline neuropathy foot with CR<2secs.  Babette Relic, MD 02/03/12 [place], MD 02/05/12 [phone]

## 2012-02-02 NOTE — ED Notes (Signed)
Took patient's blood sugar, result was: HI (greater than 600) Nurse and MD was notified.

## 2012-02-02 NOTE — ED Notes (Signed)
Pt has non-healing wound to bottom of left foot. Swelling noted. States that she has been treated at the wound care center for the wound. Wound not improving. Pt has IV antibiotics last night.

## 2012-02-02 NOTE — ED Notes (Signed)
D/C instructions reviewed. All questions answered. Rx given x1.

## 2012-02-07 ENCOUNTER — Encounter (HOSPITAL_BASED_OUTPATIENT_CLINIC_OR_DEPARTMENT_OTHER): Payer: Medicaid Other | Attending: Internal Medicine

## 2012-02-07 DIAGNOSIS — L97509 Non-pressure chronic ulcer of other part of unspecified foot with unspecified severity: Secondary | ICD-10-CM | POA: Insufficient documentation

## 2012-02-07 DIAGNOSIS — L089 Local infection of the skin and subcutaneous tissue, unspecified: Secondary | ICD-10-CM | POA: Insufficient documentation

## 2012-02-07 DIAGNOSIS — E1069 Type 1 diabetes mellitus with other specified complication: Secondary | ICD-10-CM | POA: Insufficient documentation

## 2012-02-07 NOTE — Progress Notes (Signed)
[facility]  NAME:  [name], [name] NO.:  [id number]  MEDICAL RECORD NO.:  [id number]      DATE OF BIRTH:  Nov 04, 1970  PHYSICIAN:  Ricard Dillon, M.D. VISIT DATE:  02/07/2012                                  OFFICE VISIT   [name] is a 41 year old patient who is a type 1 diabetic with a Charcot deformity of her left foot.  We had only recently discharged after healing an area in the medial left foot with overlying bony deformity in the Charcot foot.  She tells [place] that this reopened a short time after this.  She was seen in [facility] ER on November 4.  At that point the wound was noted to be infected, although I do not see that the culture results were done.  Her blood sugar was over 600 without acidosis.  She was given IV fluid and insulin.  An x-ray of the left foot on November 2 showed no radiographic evidence of acute osteomyelitis.  The wound was described as having surrounding erythema and edema.  This was a 2.5 cm ulceration at the instep of the left foot with surrounding erythema.  She was prescribed clindamycin 300 t.i.d.  The patient actually states the erythema, redness is improved on the clindamycin.  Once again I have taken time to go over the necessity of offloading this area.  Ideally, she would use a CAM walker or have a total contact cast placed, be signed out of work, etc.  However if the patient is unable to do this, she continues working 4 hours a day.  She cannot use a TCC or a CAM walker at work.  We are therefore left with the option of using a healing sandal only.  On examination today, her temperature was 98.1, pulse 83, respirations 18, blood pressure 130/78.  Her capillary blood glucose was 190.  The areas on the medial plantar left foot over a bony prominence on the instep.  This measured 1.9 x 1 x 0.1.  I did culture this and I continued her clindamycin that have already been ordered.  There  is erythema, warmth and pain around the wound.  Most worrisome, there is some warmth up the medial aspect of the foot, although no tenderness or erythema was distinctly noted.  IMPRESSION:  Diabetic foot ulceration, Wegner's grade 3 with soft tissue infection.  A plain x-ray was not indicative of acute osteomyelitis, although certainly does not rule it out.  I have continued her clindamycin at 300 t.i.d. pending my culture results today.  We dressed the wound with Silvercel, a gauze, Kerlix and net which we will leave in place for a week.  The patient continues to need to work and hence there is inadequate offloading here which I certainly understand.  However I expressed my concern about the overall situation to the patient.  I will see her again in a week.  The plan of care was extensively discussed with the patient.          ______________________________ Ricard Dillon, M.D.     MGR/MEDQ  D:  02/07/2012  T:  02/07/2012  Job:  [id number]

## 2012-02-08 LAB — CULTURE, BLOOD (ROUTINE X 2): Culture: NO GROWTH

## 2012-03-06 ENCOUNTER — Encounter (HOSPITAL_BASED_OUTPATIENT_CLINIC_OR_DEPARTMENT_OTHER): Payer: Medicaid Other

## 2012-03-06 ENCOUNTER — Encounter (HOSPITAL_BASED_OUTPATIENT_CLINIC_OR_DEPARTMENT_OTHER): Payer: Medicaid Other | Attending: Internal Medicine

## 2012-03-06 DIAGNOSIS — E1169 Type 2 diabetes mellitus with other specified complication: Secondary | ICD-10-CM | POA: Insufficient documentation

## 2012-03-06 DIAGNOSIS — L089 Local infection of the skin and subcutaneous tissue, unspecified: Secondary | ICD-10-CM | POA: Insufficient documentation

## 2012-03-06 DIAGNOSIS — L97509 Non-pressure chronic ulcer of other part of unspecified foot with unspecified severity: Secondary | ICD-10-CM | POA: Insufficient documentation

## 2012-03-18 ENCOUNTER — Encounter (HOSPITAL_COMMUNITY): Payer: Self-pay | Admitting: Emergency Medicine

## 2012-03-18 ENCOUNTER — Emergency Department (HOSPITAL_COMMUNITY)
Admission: EM | Admit: 2012-03-18 | Discharge: 2012-03-18 | Disposition: A | Discharge status: Home / Self Care | Payer: Medicaid Other | Attending: Emergency Medicine | Admitting: Emergency Medicine

## 2012-03-18 DIAGNOSIS — E1169 Type 2 diabetes mellitus with other specified complication: Secondary | ICD-10-CM | POA: Insufficient documentation

## 2012-03-18 DIAGNOSIS — E1142 Type 2 diabetes mellitus with diabetic polyneuropathy: Secondary | ICD-10-CM | POA: Insufficient documentation

## 2012-03-18 DIAGNOSIS — Z794 Long term (current) use of insulin: Secondary | ICD-10-CM | POA: Insufficient documentation

## 2012-03-18 DIAGNOSIS — Z862 Personal history of diseases of the blood and blood-forming organs and certain disorders involving the immune mechanism: Secondary | ICD-10-CM | POA: Insufficient documentation

## 2012-03-18 DIAGNOSIS — E7800 Pure hypercholesterolemia, unspecified: Secondary | ICD-10-CM | POA: Insufficient documentation

## 2012-03-18 DIAGNOSIS — I10 Essential (primary) hypertension: Secondary | ICD-10-CM | POA: Insufficient documentation

## 2012-03-18 DIAGNOSIS — E1149 Type 2 diabetes mellitus with other diabetic neurological complication: Secondary | ICD-10-CM | POA: Insufficient documentation

## 2012-03-18 DIAGNOSIS — Z87891 Personal history of nicotine dependence: Secondary | ICD-10-CM | POA: Insufficient documentation

## 2012-03-18 DIAGNOSIS — E162 Hypoglycemia, unspecified: Secondary | ICD-10-CM

## 2012-03-18 DIAGNOSIS — Z79899 Other long term (current) drug therapy: Secondary | ICD-10-CM | POA: Insufficient documentation

## 2012-03-18 LAB — GLUCOSE, CAPILLARY
Glucose-Capillary: 115 mg/dL — ABNORMAL HIGH (ref 70–99)
Glucose-Capillary: 146 mg/dL — ABNORMAL HIGH (ref 70–99)

## 2012-03-18 NOTE — ED Notes (Signed)
[id number] Expected date:<BR> Expected time:<BR> Means of arrival:<BR> Comments:<BR> EMS

## 2012-03-18 NOTE — ED Provider Notes (Signed)
History    41yF with AMS. Onset today. Currently resolved. Sugar in low 50s for EMS. MS improved with dextrose. Pt currently with no complaints. Denies pain anywhere. No trauma. Reports compliance with her insulin, but hasn't been eating much. Denies acute drug use. No fever or chills.  CSN: [id number]  Arrival date & time 03/18/12  1956   First MD Initiated Contact with Patient 03/18/12 2102      Chief Complaint  Patient presents with  . Hypoglycemia    (Consider location/radiation/quality/duration/timing/severity/associated sxs/prior treatment) HPI  Past Medical History  Diagnosis Date  . Diabetes mellitus   . Neuropathy in diabetes   . Hypertension   . Hypercholesteremia   . H/O seasonal allergies   . Anemia   . Left foot infection     Past Surgical History  Procedure Date  . Cesarean section   . Eye surgery   . Hemrrhoidectomy   . Peripherally inserted central catheter insertion     No family history on file.  History  Substance Use Topics  . Smoking status: Former Smoker    Quit date: 05/16/2008  . Smokeless tobacco: Not on file  . Alcohol Use: 0.5 oz/week    1 drink(s) per week     Comment: Occasional    OB History    Grav Para Term Preterm Abortions TAB SAB Ect Mult Living                  Review of Systems  All systems reviewed and negative, other than as noted in HPI.   Allergies  Ciprofloxacin and Cleocin  Home Medications   Current Outpatient Rx  Name  Route  Sig  Dispense  Refill  . ALBUTEROL SULFATE HFA 108 (90 BASE) MCG/ACT IN AERS   Inhalation   Inhale 2 puffs into the lungs every 6 (six) hours as needed. S.O.B.         . AMLODIPINE BESYLATE 5 MG PO TABS   Oral   Take 5 mg by mouth daily.         . ASPIRIN 325 MG PO TABS   Oral   Take 325 mg by mouth daily.         . FUROSEMIDE 20 MG PO TABS   Oral   Take 20 mg by mouth daily.          . INSULIN GLARGINE 100 UNIT/ML [place] SOLN   Subcutaneous   Inject 10 Units  into the skin 2 (two) times daily.          . INSULIN LISPRO (HUMAN) 100 UNIT/ML [place] SOLN   Subcutaneous   Inject 2-7 Units into the skin 3 (three) times daily before meals. Pt on sliding scale         . LISINOPRIL 2.5 MG PO TABS   Oral   Take 2.5 mg by mouth daily.         Marland Kitchen PRAVASTATIN SODIUM 20 MG PO TABS   Oral   Take 20 mg by mouth daily.         Marland Kitchen CLINDAMYCIN HCL 300 MG PO CAPS   Oral   Take 1 capsule (300 mg total) by mouth 3 (three) times daily.   21 capsule   0     BP 139/90  Pulse 95  Temp 98 F (36.7 C) (Oral)  Resp 16  SpO2 96%  LMP 03/18/2012  Physical Exam  Nursing note and vitals reviewed. Constitutional: She is oriented to person, place, and time.  She appears well-developed and well-nourished. No distress.       Laying in bed. NAD.  HENT:  Head: Normocephalic and atraumatic.  Eyes: Conjunctivae normal are normal. Right eye exhibits no discharge. Left eye exhibits no discharge.  Neck: Neck supple.  Cardiovascular: Normal rate, regular rhythm and normal heart sounds.  Exam reveals no gallop and no friction rub.   No murmur heard. Pulmonary/Chest: Effort normal and breath sounds normal. No respiratory distress.  Abdominal: Soft. She exhibits no distension. There is no tenderness.  Musculoskeletal: She exhibits no edema and no tenderness.  Neurological: She is alert and oriented to person, place, and time. No cranial nerve deficit. She exhibits normal muscle tone. Coordination normal.  Skin: Skin is warm and dry.  Psychiatric: She has a normal mood and affect. Her behavior is normal. Thought content normal.    ED Course  Procedures (including critical care time)  Labs Reviewed  GLUCOSE, CAPILLARY - Abnormal; Notable for the following:    Glucose-Capillary 115 (*)     All other components within normal limits   No results found.   1. Hypoglycemia       MDM  41 year old female with hypoglycemia. Patient with no complaints on my exam.  She drinks bradycardia and had a [place] sandwich. We'll check another CBG. Remains normal plan discharge. Hypoglycemia likely secondary to insulin use but poor PO intake.        Virgel Manifold, MD 03/22/12 [phone]

## 2012-03-18 NOTE — ED Notes (Signed)
PER EMS- pt picked up from with c/o "diabetic reaction" and unresponsive.  Upon EMS arrival pt was responsive to pain and alert to verbal intermittently. Initial CBG was 52.  Pt given 25g D10 by EMS.  CBG increased to 127.  Pt reports not having any food in her house, which is causing her sugar to drop. HX of cocaine abuse.

## 2012-03-27 LAB — GLUCOSE, CAPILLARY: Glucose-Capillary: 401 mg/dL — ABNORMAL HIGH (ref 70–99)

## 2012-04-04 ENCOUNTER — Encounter (HOSPITAL_BASED_OUTPATIENT_CLINIC_OR_DEPARTMENT_OTHER): Payer: Self-pay | Admitting: *Deleted

## 2012-04-04 ENCOUNTER — Emergency Department (HOSPITAL_BASED_OUTPATIENT_CLINIC_OR_DEPARTMENT_OTHER)
Admission: EM | Admit: 2012-04-04 | Discharge: 2012-04-04 | Disposition: A | Discharge status: Home / Self Care | Payer: Medicaid Other | Attending: Emergency Medicine | Admitting: Emergency Medicine

## 2012-04-04 DIAGNOSIS — Z794 Long term (current) use of insulin: Secondary | ICD-10-CM | POA: Insufficient documentation

## 2012-04-04 DIAGNOSIS — Z8619 Personal history of other infectious and parasitic diseases: Secondary | ICD-10-CM | POA: Insufficient documentation

## 2012-04-04 DIAGNOSIS — R079 Chest pain, unspecified: Secondary | ICD-10-CM | POA: Insufficient documentation

## 2012-04-04 DIAGNOSIS — Z87891 Personal history of nicotine dependence: Secondary | ICD-10-CM | POA: Insufficient documentation

## 2012-04-04 DIAGNOSIS — R6883 Chills (without fever): Secondary | ICD-10-CM | POA: Insufficient documentation

## 2012-04-04 DIAGNOSIS — Z7982 Long term (current) use of aspirin: Secondary | ICD-10-CM | POA: Insufficient documentation

## 2012-04-04 DIAGNOSIS — E1149 Type 2 diabetes mellitus with other diabetic neurological complication: Secondary | ICD-10-CM | POA: Insufficient documentation

## 2012-04-04 DIAGNOSIS — Z79899 Other long term (current) drug therapy: Secondary | ICD-10-CM | POA: Insufficient documentation

## 2012-04-04 DIAGNOSIS — Z862 Personal history of diseases of the blood and blood-forming organs and certain disorders involving the immune mechanism: Secondary | ICD-10-CM | POA: Insufficient documentation

## 2012-04-04 DIAGNOSIS — I10 Essential (primary) hypertension: Secondary | ICD-10-CM | POA: Insufficient documentation

## 2012-04-04 DIAGNOSIS — E7800 Pure hypercholesterolemia, unspecified: Secondary | ICD-10-CM | POA: Insufficient documentation

## 2012-04-04 DIAGNOSIS — J209 Acute bronchitis, unspecified: Secondary | ICD-10-CM

## 2012-04-04 DIAGNOSIS — E1142 Type 2 diabetes mellitus with diabetic polyneuropathy: Secondary | ICD-10-CM | POA: Insufficient documentation

## 2012-04-04 DIAGNOSIS — J3489 Other specified disorders of nose and nasal sinuses: Secondary | ICD-10-CM | POA: Insufficient documentation

## 2012-04-04 MED ORDER — GUAIFENESIN-CODEINE 100-10 MG/5ML PO SYRP: 5.0000 mL | ORAL_SOLUTION | Freq: Three times a day (TID) | ORAL | Status: DC | PRN

## 2012-04-04 MED ORDER — AZITHROMYCIN 250 MG PO TABS: 250.0000 mg | ORAL_TABLET | Freq: Every day | ORAL | Status: DC

## 2012-04-04 NOTE — ED Notes (Signed)
Pt amb to triage with quick steady gait in nad. Pt reports cough and congestion x yesterday.

## 2012-04-04 NOTE — ED Provider Notes (Signed)
History     CSN: [id number]  Arrival date & time 04/04/12  1025   First MD Initiated Contact with Patient 04/04/12 1103      Chief Complaint  Patient presents with  . Cough  . Nasal Congestion    (Consider location/radiation/quality/duration/timing/severity/associated sxs/prior treatment) Patient is a 42 y.o. female presenting with cough. The history is provided by the patient.  Cough This is a new problem. The current episode started 2 days ago. The problem occurs constantly. The problem has been gradually worsening. The cough is productive of brown sputum. The maximum temperature recorded prior to her arrival was 100 to 100.9 F. The fever has been present for less than 1 day. Associated symptoms include chest pain and chills. She has tried decongestants for the symptoms. The treatment provided no relief. She is not a smoker (quit 3 years ago). Her past medical history does not include asthma.    Past Medical History  Diagnosis Date  . Diabetes mellitus   . Neuropathy in diabetes   . Hypertension   . Hypercholesteremia   . H/O seasonal allergies   . Anemia   . Left foot infection     Past Surgical History  Procedure Date  . Cesarean section   . Eye surgery   . Hemrrhoidectomy   . Peripherally inserted central catheter insertion     History reviewed. No pertinent family history.  History  Substance Use Topics  . Smoking status: Former Smoker    Quit date: 05/16/2008  . Smokeless tobacco: Not on file  . Alcohol Use: 0.5 oz/week    1 drink(s) per week     Comment: Occasional    OB History    Grav Para Term Preterm Abortions TAB SAB Ect Mult Living                  Review of Systems  Constitutional: Positive for chills.  Respiratory: Positive for cough.   Cardiovascular: Positive for chest pain.  All other systems reviewed and are negative.    Allergies  Ciprofloxacin and Cleocin  Home Medications   Current Outpatient Rx  Name  Route  Sig  Dispense   Refill  . ALBUTEROL SULFATE HFA 108 (90 BASE) MCG/ACT IN AERS   Inhalation   Inhale 2 puffs into the lungs every 6 (six) hours as needed. S.O.B.         . AMLODIPINE BESYLATE 5 MG PO TABS   Oral   Take 5 mg by mouth daily.         . ASPIRIN 325 MG PO TABS   Oral   Take 325 mg by mouth daily.         Marland Kitchen CLINDAMYCIN HCL 300 MG PO CAPS   Oral   Take 1 capsule (300 mg total) by mouth 3 (three) times daily.   21 capsule   0   . FUROSEMIDE 20 MG PO TABS   Oral   Take 20 mg by mouth daily.          . INSULIN GLARGINE 100 UNIT/ML [place] SOLN   Subcutaneous   Inject 10 Units into the skin 2 (two) times daily.          . INSULIN LISPRO (HUMAN) 100 UNIT/ML [place] SOLN   Subcutaneous   Inject 2-7 Units into the skin 3 (three) times daily before meals. Pt on sliding scale         . LISINOPRIL 2.5 MG PO TABS   Oral  Take 2.5 mg by mouth daily.         Marland Kitchen PRAVASTATIN SODIUM 20 MG PO TABS   Oral   Take 20 mg by mouth daily.           BP 147/84  Temp 98.8 F (37.1 C) (Oral)  Resp 16  Ht 5\' 8"  (1.727 m)  Wt 130 lb 8 oz (59.194 kg)  BMI 19.84 kg/m2  SpO2 100%  LMP 03/18/2012  Physical Exam  Nursing note and vitals reviewed. Constitutional: She is oriented to person, place, and time. She appears well-developed and well-nourished. No distress.  HENT:  Head: Normocephalic and atraumatic.  Mouth/Throat: Oropharynx is clear and moist.       Bilateral TM's clear bilaterally.  Neck: Normal range of motion. Neck supple.  Cardiovascular: Normal rate, regular rhythm and normal heart sounds.   No murmur heard. Pulmonary/Chest: Effort normal and breath sounds normal. No respiratory distress. She has no wheezes.  Abdominal: Soft. Bowel sounds are normal. She exhibits no distension.  Musculoskeletal: Normal range of motion. She exhibits no edema.  Lymphadenopathy:    She has no cervical adenopathy.  Neurological: She is alert and oriented to person, place, and time.  Skin:  Skin is warm and dry. She is not diaphoretic.    ED Course  Procedures (including critical care time)  Labs Reviewed - No data to display No results found.   No diagnosis found.    MDM  The patient presents with chest congestion, productive cough, and fever for two days.  She is a Type 1 Diabetic.  O2 sats are adequate and lungs are clear.  I suspect this is viral, however due to her history of IDDM and smoking in the past I will write for an antibiotic that I have recommended she fill in the next 2 days if not improving.  Return prn.        Veryl Speak, MD 04/04/12 1118

## 2012-04-12 IMAGING — CR DG FOOT COMPLETE 3+V*R*
3 series · 3 of 3 positions shown · non-contrast
Comparison: 10/29/2008

CLINICAL DATA: Inflammation and pain in the foot after being
scratched by cat 2 weeks ago.

RIGHT FOOT COMPLETE - 3+ VIEW

[t foot ap right]
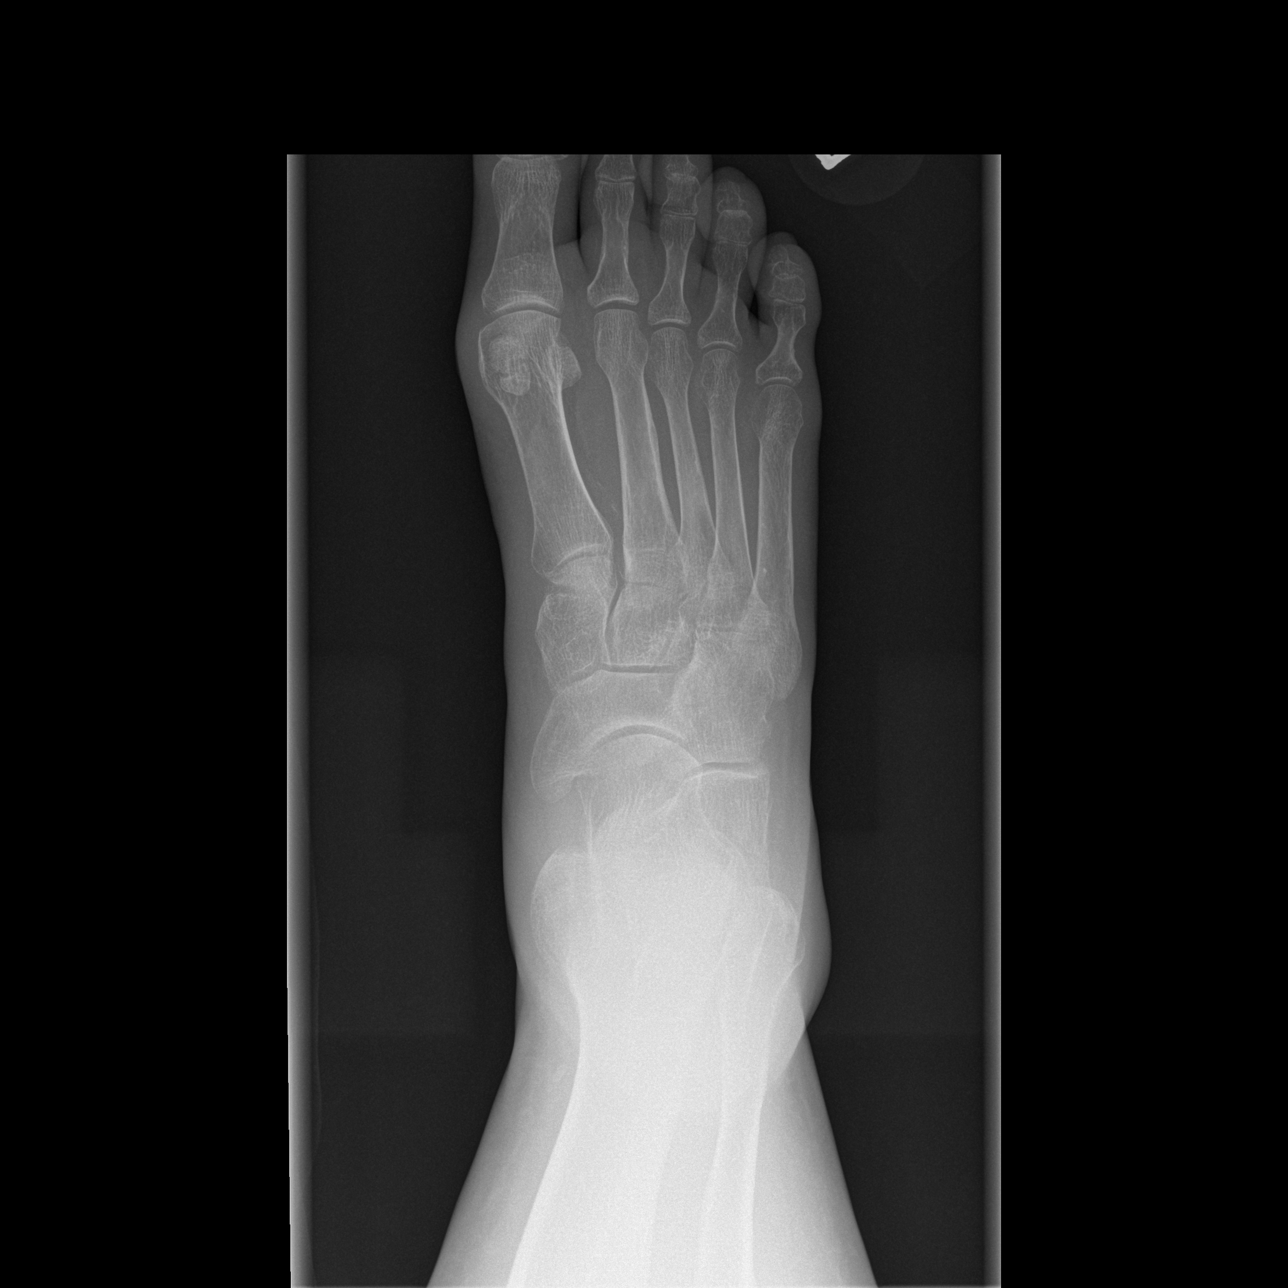

[t foot oblique right]
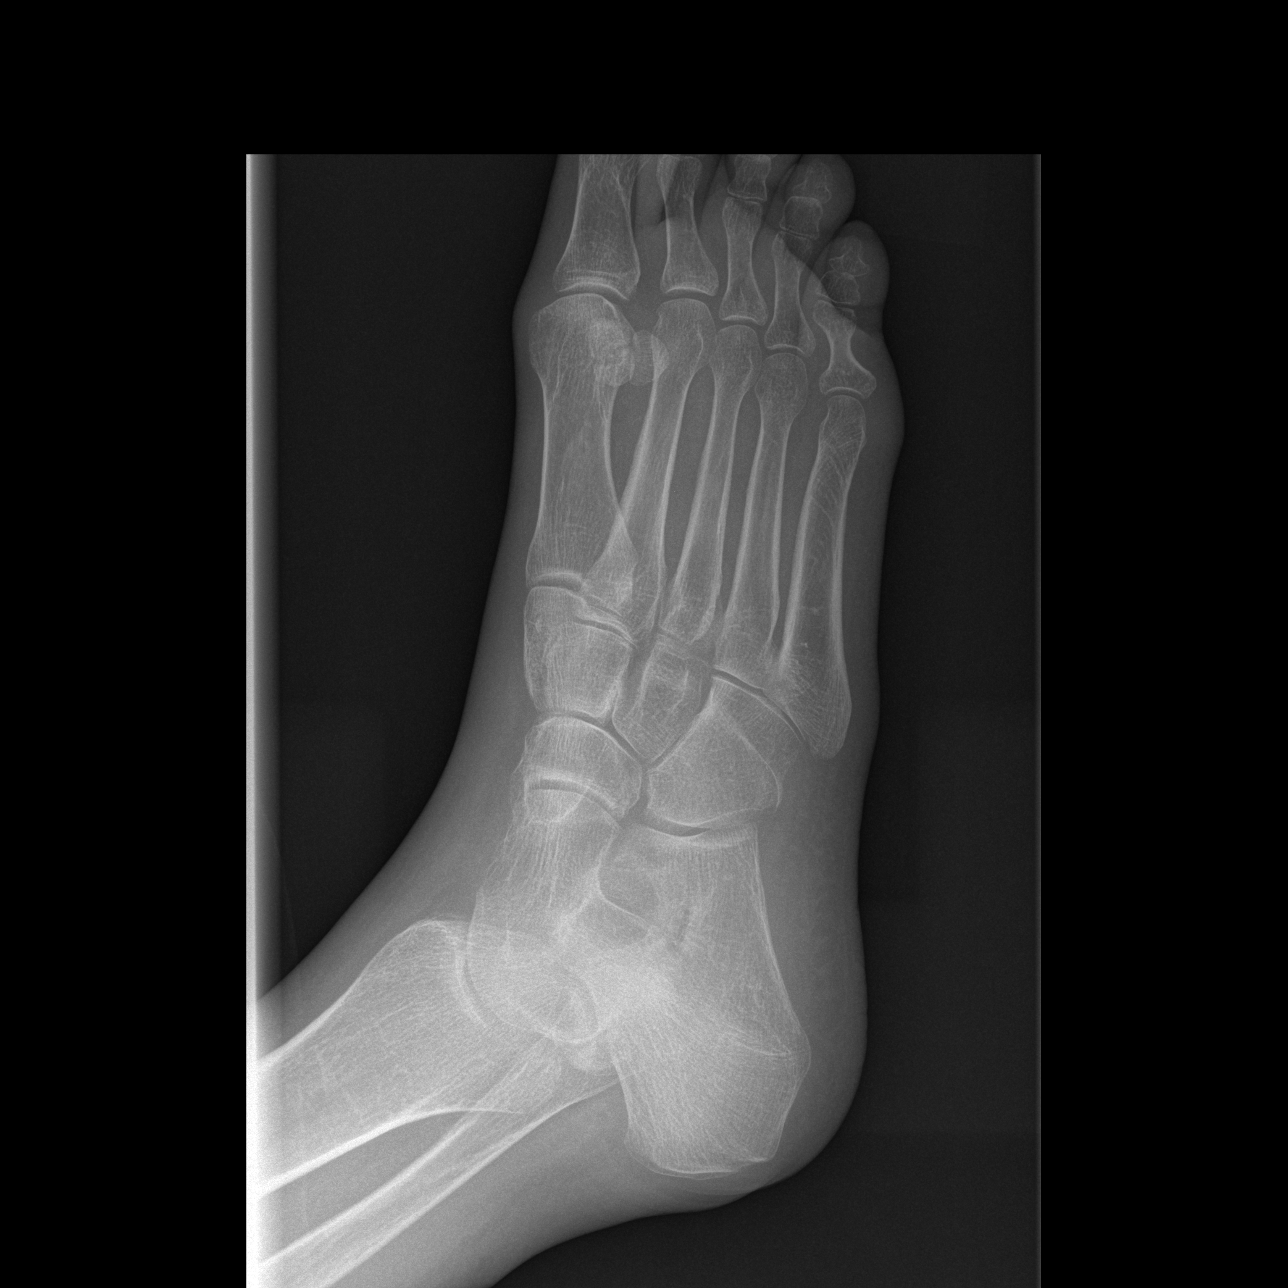

[t foot lat right]
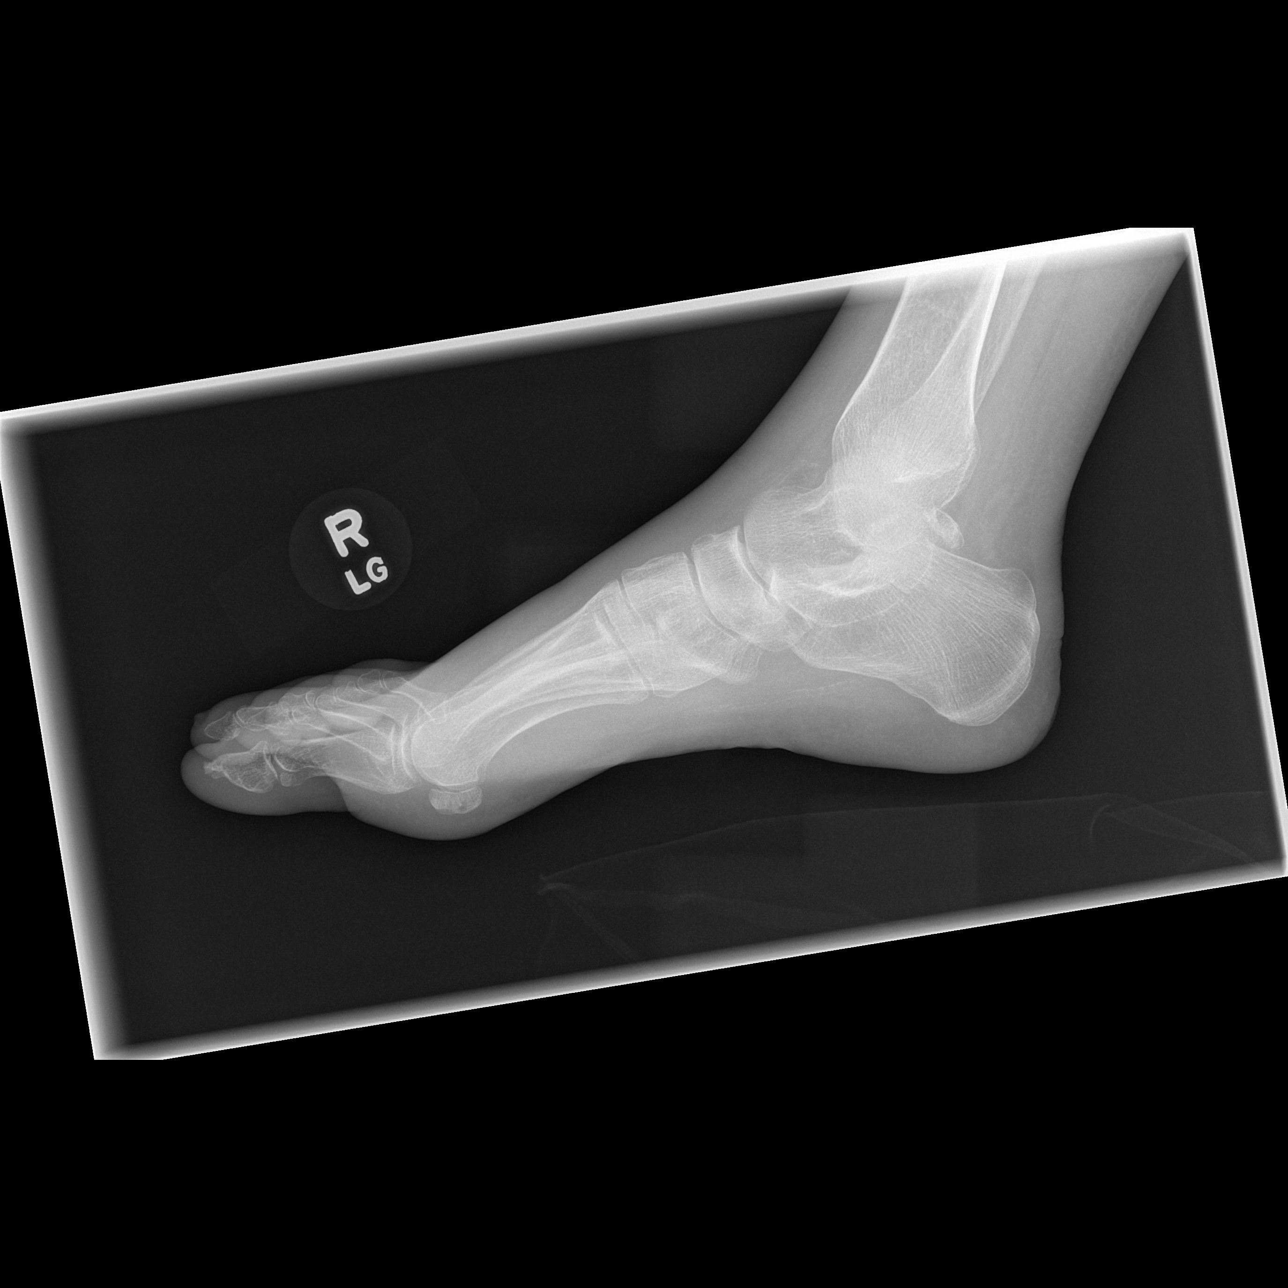

[3 of 3 positions shown; findings below may reference images not displayed]

FINDINGS: Vascular calcifications consistent with diabetes.  No
evidence of acute fracture or subluxation.  No bone erosion or
cortical reaction.  No abnormal radiopaque foreign bodies or gas in
the soft tissues.
IMPRESSION: No acute bony abnormalities demonstrated.

## 2012-05-30 ENCOUNTER — Encounter (HOSPITAL_BASED_OUTPATIENT_CLINIC_OR_DEPARTMENT_OTHER): Payer: Self-pay | Admitting: *Deleted

## 2012-05-30 ENCOUNTER — Emergency Department (HOSPITAL_BASED_OUTPATIENT_CLINIC_OR_DEPARTMENT_OTHER)
Admission: EM | Admit: 2012-05-30 | Discharge: 2012-05-30 | Disposition: A | Discharge status: Home / Self Care | Payer: Medicaid Other | Attending: Emergency Medicine | Admitting: Emergency Medicine

## 2012-05-30 DIAGNOSIS — E1149 Type 2 diabetes mellitus with other diabetic neurological complication: Secondary | ICD-10-CM | POA: Insufficient documentation

## 2012-05-30 DIAGNOSIS — Z794 Long term (current) use of insulin: Secondary | ICD-10-CM | POA: Insufficient documentation

## 2012-05-30 DIAGNOSIS — Z87891 Personal history of nicotine dependence: Secondary | ICD-10-CM | POA: Insufficient documentation

## 2012-05-30 DIAGNOSIS — Z7982 Long term (current) use of aspirin: Secondary | ICD-10-CM | POA: Insufficient documentation

## 2012-05-30 DIAGNOSIS — I10 Essential (primary) hypertension: Secondary | ICD-10-CM | POA: Insufficient documentation

## 2012-05-30 DIAGNOSIS — E7800 Pure hypercholesterolemia, unspecified: Secondary | ICD-10-CM | POA: Insufficient documentation

## 2012-05-30 DIAGNOSIS — Z79899 Other long term (current) drug therapy: Secondary | ICD-10-CM | POA: Insufficient documentation

## 2012-05-30 DIAGNOSIS — S91301A Unspecified open wound, right foot, initial encounter: Secondary | ICD-10-CM

## 2012-05-30 DIAGNOSIS — L988 Other specified disorders of the skin and subcutaneous tissue: Secondary | ICD-10-CM | POA: Insufficient documentation

## 2012-05-30 DIAGNOSIS — Z862 Personal history of diseases of the blood and blood-forming organs and certain disorders involving the immune mechanism: Secondary | ICD-10-CM | POA: Insufficient documentation

## 2012-05-30 DIAGNOSIS — E1142 Type 2 diabetes mellitus with diabetic polyneuropathy: Secondary | ICD-10-CM | POA: Insufficient documentation

## 2012-05-30 NOTE — ED Notes (Signed)
Pt amb to room 9 with quick steady gait smiling and laughing in nad. Pt reports wearing new shoes to a party last weekend, and noticed a blister to the bottom of her foot on Monday. Pt is diabetic with neuropathy, states "I don't feel anything in my feet..." pt has wound to her left foot that is being treated at the wound center.

## 2012-05-30 NOTE — ED Provider Notes (Signed)
History     CSN: [id number]  Arrival date & time 05/30/12  23   First MD Initiated Contact with Patient 05/30/12 1258      Chief Complaint  Patient presents with  . Blister  . Diabetes    (Consider location/radiation/quality/duration/timing/severity/associated sxs/prior treatment) Patient is a 42 y.o. female presenting with diabetes problem.  Diabetes   Patient reports she had a blister on her foot 4 days ago. Approximately 2 days ago she noticed that the blister had come off exposing raw skin the plantar surface of her right foot. She has no pain in her foot and has no sensation in her foot due to diabetic neuropathy. Denies other complaint . She is treated herself with silver dressings she received from the wound center for a prior wound to her left foot which has since healed. Past Medical History  Diagnosis Date  . Diabetes mellitus   . Neuropathy in diabetes   . Hypertension   . Hypercholesteremia   . H/O seasonal allergies   . Anemia   . Left foot infection     Past Surgical History  Procedure Laterality Date  . Cesarean section    . Eye surgery    . Hemrrhoidectomy    . Peripherally inserted central catheter insertion      History reviewed. No pertinent family history.  History  Substance Use Topics  . Smoking status: Former Smoker    Quit date: 05/16/2008  . Smokeless tobacco: Not on file  . Alcohol Use: 0.5 oz/week    1 drink(s) per week     Comment: Occasional    OB History   Grav Para Term Preterm Abortions TAB SAB Ect Mult Living                  Review of Systems  Constitutional: Negative.   Skin: Positive for wound.       Wound at plantar surface of right foot    Allergies  Ciprofloxacin and Cleocin  Home Medications   Current Outpatient Rx  Name  Route  Sig  Dispense  Refill  . albuterol (PROVENTIL HFA;VENTOLIN HFA) 108 (90 BASE) MCG/ACT inhaler   Inhalation   Inhale 2 puffs into the lungs every 6 (six) hours as needed.  S.O.B.         . amLODipine (NORVASC) 5 MG tablet   Oral   Take 5 mg by mouth daily.         Marland Kitchen aspirin 325 MG tablet   Oral   Take 325 mg by mouth daily.         Marland Kitchen azithromycin (ZITHROMAX) 250 MG tablet   Oral   Take 1 tablet (250 mg total) by mouth daily. Take first 2 tablets together, then 1 every day until finished.   6 tablet   0   . clindamycin (CLEOCIN) 300 MG capsule   Oral   Take 1 capsule (300 mg total) by mouth 3 (three) times daily.   21 capsule   0   . furosemide (LASIX) 20 MG tablet   Oral   Take 20 mg by mouth daily.          Marland Kitchen guaiFENesin-codeine (ROBITUSSIN AC) 100-10 MG/5ML syrup   Oral   Take 5 mLs by mouth 3 (three) times daily as needed for cough.   120 mL   0   . insulin glargine (LANTUS) 100 UNIT/ML injection   Subcutaneous   Inject 10 Units into the skin 2 (two) times daily.          Marland Kitchen  insulin lispro (HUMALOG) 100 UNIT/ML injection   Subcutaneous   Inject 2-7 Units into the skin 3 (three) times daily before meals. Pt on sliding scale         . lisinopril (PRINIVIL,ZESTRIL) 2.5 MG tablet   Oral   Take 2.5 mg by mouth daily.         . pravastatin (PRAVACHOL) 20 MG tablet   Oral   Take 20 mg by mouth daily.           BP 119/82  Pulse 90  Temp(Src) 97.9 F (36.6 C) (Oral)  Resp 16  SpO2 100%  LMP 05/26/2012  Physical Exam  Nursing note and vitals reviewed. Constitutional: She appears well-developed and well-nourished. No distress.  HENT:  Head: Normocephalic and atraumatic.  Right Ear: External ear normal.  Left Ear: External ear normal.  Eyes: EOM are normal.  Neck: Neck supple.  Cardiovascular: Normal rate.   Pulmonary/Chest: Effort normal.  Abdominal: She exhibits no distension.  Musculoskeletal:  Dime-sized open wound to the plantar surface of right foot immediately proximal to the second and third toes. No redness no swelling no drainage from wound wound is clean-appearing    ED Course  Procedures  (including critical care time)  Labs Reviewed - No data to display No results found.   No diagnosis found.    MDM  Plan local care topical antibiotics the plan is for scheduled wound center for this patient on March 6 at 3 PM Diagnosis wound to right foot        Orlie Dakin, MD 05/30/12 1344

## 2012-06-05 ENCOUNTER — Other Ambulatory Visit (HOSPITAL_BASED_OUTPATIENT_CLINIC_OR_DEPARTMENT_OTHER): Payer: Self-pay | Admitting: Internal Medicine

## 2012-06-05 ENCOUNTER — Ambulatory Visit (HOSPITAL_COMMUNITY)
Admission: RE | Admit: 2012-06-05 | Discharge: 2012-06-05 | Disposition: A | Discharge status: Home / Self Care | Payer: Medicaid Other | Source: Ambulatory Visit | Attending: Internal Medicine | Admitting: Internal Medicine

## 2012-06-05 ENCOUNTER — Encounter (HOSPITAL_BASED_OUTPATIENT_CLINIC_OR_DEPARTMENT_OTHER): Payer: Medicaid Other | Attending: Internal Medicine

## 2012-06-05 DIAGNOSIS — B958 Unspecified staphylococcus as the cause of diseases classified elsewhere: Secondary | ICD-10-CM | POA: Insufficient documentation

## 2012-06-05 DIAGNOSIS — M869 Osteomyelitis, unspecified: Secondary | ICD-10-CM

## 2012-06-05 DIAGNOSIS — L0889 Other specified local infections of the skin and subcutaneous tissue: Secondary | ICD-10-CM | POA: Insufficient documentation

## 2012-06-05 DIAGNOSIS — L988 Other specified disorders of the skin and subcutaneous tissue: Secondary | ICD-10-CM | POA: Insufficient documentation

## 2012-06-05 DIAGNOSIS — M949 Disorder of cartilage, unspecified: Secondary | ICD-10-CM | POA: Insufficient documentation

## 2012-06-05 DIAGNOSIS — E1169 Type 2 diabetes mellitus with other specified complication: Secondary | ICD-10-CM | POA: Insufficient documentation

## 2012-06-05 DIAGNOSIS — L97509 Non-pressure chronic ulcer of other part of unspecified foot with unspecified severity: Secondary | ICD-10-CM | POA: Insufficient documentation

## 2012-06-05 DIAGNOSIS — M899 Disorder of bone, unspecified: Secondary | ICD-10-CM | POA: Insufficient documentation

## 2012-06-05 LAB — GLUCOSE, CAPILLARY: Glucose-Capillary: 201 mg/dL — ABNORMAL HIGH (ref 70–99)

## 2012-07-03 ENCOUNTER — Encounter (HOSPITAL_BASED_OUTPATIENT_CLINIC_OR_DEPARTMENT_OTHER): Payer: Medicaid Other | Attending: Internal Medicine

## 2012-07-03 DIAGNOSIS — E1169 Type 2 diabetes mellitus with other specified complication: Secondary | ICD-10-CM | POA: Insufficient documentation

## 2012-07-03 DIAGNOSIS — L97509 Non-pressure chronic ulcer of other part of unspecified foot with unspecified severity: Secondary | ICD-10-CM | POA: Insufficient documentation

## 2012-07-31 ENCOUNTER — Encounter (HOSPITAL_BASED_OUTPATIENT_CLINIC_OR_DEPARTMENT_OTHER): Payer: Medicaid Other | Attending: Internal Medicine

## 2012-07-31 DIAGNOSIS — L97509 Non-pressure chronic ulcer of other part of unspecified foot with unspecified severity: Secondary | ICD-10-CM | POA: Insufficient documentation

## 2012-07-31 DIAGNOSIS — E1169 Type 2 diabetes mellitus with other specified complication: Secondary | ICD-10-CM | POA: Insufficient documentation

## 2012-08-11 ENCOUNTER — Emergency Department (HOSPITAL_BASED_OUTPATIENT_CLINIC_OR_DEPARTMENT_OTHER)
Admission: EM | Admit: 2012-08-11 | Discharge: 2012-08-11 | Disposition: A | Discharge status: Home / Self Care | Payer: Medicaid Other | Attending: Emergency Medicine | Admitting: Emergency Medicine

## 2012-08-11 ENCOUNTER — Encounter (HOSPITAL_BASED_OUTPATIENT_CLINIC_OR_DEPARTMENT_OTHER): Payer: Self-pay | Admitting: Family Medicine

## 2012-08-11 ENCOUNTER — Emergency Department (HOSPITAL_BASED_OUTPATIENT_CLINIC_OR_DEPARTMENT_OTHER): Payer: Medicaid Other

## 2012-08-11 DIAGNOSIS — Z7982 Long term (current) use of aspirin: Secondary | ICD-10-CM | POA: Insufficient documentation

## 2012-08-11 DIAGNOSIS — M8580 Other specified disorders of bone density and structure, unspecified site: Secondary | ICD-10-CM

## 2012-08-11 DIAGNOSIS — E1149 Type 2 diabetes mellitus with other diabetic neurological complication: Secondary | ICD-10-CM | POA: Insufficient documentation

## 2012-08-11 DIAGNOSIS — L0390 Cellulitis, unspecified: Secondary | ICD-10-CM

## 2012-08-11 DIAGNOSIS — M899 Disorder of bone, unspecified: Secondary | ICD-10-CM | POA: Insufficient documentation

## 2012-08-11 DIAGNOSIS — L02619 Cutaneous abscess of unspecified foot: Secondary | ICD-10-CM | POA: Insufficient documentation

## 2012-08-11 DIAGNOSIS — Z862 Personal history of diseases of the blood and blood-forming organs and certain disorders involving the immune mechanism: Secondary | ICD-10-CM | POA: Insufficient documentation

## 2012-08-11 DIAGNOSIS — Z8709 Personal history of other diseases of the respiratory system: Secondary | ICD-10-CM | POA: Insufficient documentation

## 2012-08-11 DIAGNOSIS — I10 Essential (primary) hypertension: Secondary | ICD-10-CM | POA: Insufficient documentation

## 2012-08-11 DIAGNOSIS — E7800 Pure hypercholesterolemia, unspecified: Secondary | ICD-10-CM | POA: Insufficient documentation

## 2012-08-11 DIAGNOSIS — Z87891 Personal history of nicotine dependence: Secondary | ICD-10-CM | POA: Insufficient documentation

## 2012-08-11 DIAGNOSIS — Z23 Encounter for immunization: Secondary | ICD-10-CM | POA: Insufficient documentation

## 2012-08-11 DIAGNOSIS — E1142 Type 2 diabetes mellitus with diabetic polyneuropathy: Secondary | ICD-10-CM | POA: Insufficient documentation

## 2012-08-11 DIAGNOSIS — Z79899 Other long term (current) drug therapy: Secondary | ICD-10-CM | POA: Insufficient documentation

## 2012-08-11 DIAGNOSIS — E119 Type 2 diabetes mellitus without complications: Secondary | ICD-10-CM

## 2012-08-11 DIAGNOSIS — Z794 Long term (current) use of insulin: Secondary | ICD-10-CM | POA: Insufficient documentation

## 2012-08-11 MED ORDER — SULFAMETHOXAZOLE-TRIMETHOPRIM 800-160 MG PO TABS: 1.0000 | ORAL_TABLET | Freq: Two times a day (BID) | ORAL | Status: DC

## 2012-08-11 MED ORDER — HYDROCODONE-ACETAMINOPHEN 5-325 MG PO TABS: 2.0000 | ORAL_TABLET | Freq: Four times a day (QID) | ORAL | Status: DC | PRN

## 2012-08-11 MED ORDER — TETANUS-DIPHTH-ACELL PERTUSSIS 5-2.5-18.5 LF-MCG/0.5 IM SUSP
0.5000 mL | Freq: Once | INTRAMUSCULAR | Status: AC
  Administered 2012-08-11: 0.5 mL via INTRAMUSCULAR
  Filled 2012-08-11: qty 0.5

## 2012-08-11 MED ORDER — ACETAMINOPHEN 325 MG PO TABS
650.0000 mg | ORAL_TABLET | Freq: Once | ORAL | Status: AC
  Administered 2012-08-11: 650 mg via ORAL
  Filled 2012-08-11: qty 2

## 2012-08-11 NOTE — ED Provider Notes (Signed)
History     CSN: [id number]  Arrival date & time 08/11/12  1444   First MD Initiated Contact with Patient 08/11/12 1459      Chief Complaint  Patient presents with  . Foot Pain    (Consider location/radiation/quality/duration/timing/severity/associated sxs/prior treatment) HPI Comments: Patient is a 42 year old diabetic female who presents today with redness, swelling, pain in her right heel. She is unsure of she stepped on something to her diabetic neuropathy. The pain began last night and worsened this morning. She describes it as a sharp burning sensation without radiation. She can pinpoint an area where the pain is the worst. She is unsure of her last tetanus shot. Her diabetes is not well controlled. She states her blood sugars range from the 80 and to be 400. She uses insulin. He has been walking and standing on her foot all day. She denies fevers, chills, shortness of breath, nausea, vomiting, abdominal pain.   Patient is a 42 y.o. female presenting with lower extremity pain. The history is provided by the patient. No language interpreter was used.  Foot Pain Associated symptoms include joint swelling. Pertinent negatives include no chest pain, chills or fever.    Past Medical History  Diagnosis Date  . Diabetes mellitus   . Neuropathy in diabetes   . Hypertension   . Hypercholesteremia   . H/O seasonal allergies   . Anemia   . Left foot infection     Past Surgical History  Procedure Laterality Date  . Cesarean section    . Eye surgery    . Hemrrhoidectomy    . Peripherally inserted central catheter insertion      No family history on file.  History  Substance Use Topics  . Smoking status: Former Smoker    Quit date: 05/16/2008  . Smokeless tobacco: Not on file  . Alcohol Use: 0.5 oz/week    1 drink(s) per week     Comment: Occasional    OB History   Grav Para Term Preterm Abortions TAB SAB Ect Mult Living                  Review of Systems   Constitutional: Negative for fever and chills.  Respiratory: Negative for shortness of breath.   Cardiovascular: Negative for chest pain.  Musculoskeletal: Positive for joint swelling.  Skin: Positive for wound.  All other systems reviewed and are negative.    Allergies  Ciprofloxacin and Cleocin  Home Medications   Current Outpatient Rx  Name  Route  Sig  Dispense  Refill  . albuterol (PROVENTIL HFA;VENTOLIN HFA) 108 (90 BASE) MCG/ACT inhaler   Inhalation   Inhale 2 puffs into the lungs every 6 (six) hours as needed. S.O.B.         . amLODipine (NORVASC) 5 MG tablet   Oral   Take 5 mg by mouth daily.         Marland Kitchen aspirin 325 MG tablet   Oral   Take 325 mg by mouth daily.         Marland Kitchen azithromycin (ZITHROMAX) 250 MG tablet   Oral   Take 1 tablet (250 mg total) by mouth daily. Take first 2 tablets together, then 1 every day until finished.   6 tablet   0   . clindamycin (CLEOCIN) 300 MG capsule   Oral   Take 1 capsule (300 mg total) by mouth 3 (three) times daily.   21 capsule   0   . furosemide (LASIX) 20  MG tablet   Oral   Take 20 mg by mouth daily.          Marland Kitchen guaiFENesin-codeine (ROBITUSSIN AC) 100-10 MG/5ML syrup   Oral   Take 5 mLs by mouth 3 (three) times daily as needed for cough.   120 mL   0   . insulin glargine (LANTUS) 100 UNIT/ML injection   Subcutaneous   Inject 10 Units into the skin 2 (two) times daily.          . insulin lispro (HUMALOG) 100 UNIT/ML injection   Subcutaneous   Inject 2-7 Units into the skin 3 (three) times daily before meals. Pt on sliding scale         . lisinopril (PRINIVIL,ZESTRIL) 2.5 MG tablet   Oral   Take 2.5 mg by mouth daily.         . pravastatin (PRAVACHOL) 20 MG tablet   Oral   Take 20 mg by mouth daily.           BP 130/83  Pulse 92  Temp(Src) 99.6 F (37.6 C) (Oral)  Resp 16  Ht 5\' 7"  (1.702 m)  Wt 125 lb (56.7 kg)  BMI 19.57 kg/m2  SpO2 99%  LMP 08/04/2012  Physical Exam  Nursing  note and vitals reviewed. Constitutional: She is oriented to person, place, and time. She appears well-developed and well-nourished. No distress.  HENT:  Head: Normocephalic and atraumatic.  Right Ear: External ear normal.  Left Ear: External ear normal.  Nose: Nose normal.  Mouth/Throat: Oropharynx is clear and moist.  Eyes: Conjunctivae are normal.  Neck: Normal range of motion.  Cardiovascular: Normal rate, regular rhythm and normal heart sounds.   Pulmonary/Chest: Effort normal and breath sounds normal. No stridor. No respiratory distress. She has no wheezes. She has no rales.  Abdominal: Soft. She exhibits no distension. There is no tenderness.  Musculoskeletal: Normal range of motion.  Neurological: She is alert and oriented to person, place, and time. She has normal strength.  Skin: Skin is warm and dry. She is not diaphoretic. There is erythema.  Erythema and swelling of right lateral ankle, crustation on bottom of heel which patient continues to pick at   Psychiatric: She has a normal mood and affect. Her behavior is normal.    ED Course  Procedures (including critical care time)  Labs Reviewed - No data to display Dg Foot Complete Right  08/11/2012  *RADIOLOGY REPORT*  Clinical Data: Foot pain  RIGHT FOOT COMPLETE - 3+ VIEW  Comparison: 06/05/2012  Findings: Three views of the right foot submitted.  There is healing fracture proximal phalanx fourth toe.  Diffuse osteopenia again noted.  IMPRESSION: Diffuse osteopenia again noted.  Healing fracture proximal phalanx fourth toe of indeterminate age.   Original Report Authenticated By: Lahoma Crocker, M.D.      1. Cellulitis   2. Osteopenia   3. Diabetes       MDM  Patient is a 42 year old diabetic who presents today with cellulitis in her right foot after a possible puncture wound. She was given a tetanus shot. X-ray shows diffuse osteopenia and a healing fracture, but nothing acute. No foreign bodies appreciated. No concern  for osteomyelitis. She was given Bactrim and pain medication. Stop picking at your wound. Discussed the importance of followup with her primary care provider about her diabetes. Return instructions given. Vital signs stable for discharge.Patient / Family / Caregiver informed of clinical course, understand medical decision-making process, and agree with plan.  Elwyn Lade, PA-C 08/11/12 1717

## 2012-08-11 NOTE — ED Notes (Signed)
Pt c/o right heel pain onset last night, unknown if injured. Redness noted.

## 2012-08-11 NOTE — ED Provider Notes (Signed)
Medical screening examination/treatment/procedure(s) were performed by non-physician practitioner and as supervising physician I was immediately available for consultation/collaboration.   Ezequiel Essex, MD 08/11/12 2112

## 2012-08-14 ENCOUNTER — Other Ambulatory Visit (HOSPITAL_BASED_OUTPATIENT_CLINIC_OR_DEPARTMENT_OTHER): Payer: Self-pay | Admitting: Internal Medicine

## 2012-08-14 ENCOUNTER — Ambulatory Visit (HOSPITAL_COMMUNITY)
Admission: RE | Admit: 2012-08-14 | Discharge: 2012-08-14 | Disposition: A | Discharge status: Home / Self Care | Payer: Medicaid Other | Source: Ambulatory Visit | Attending: Internal Medicine | Admitting: Internal Medicine

## 2012-08-14 DIAGNOSIS — M79672 Pain in left foot: Secondary | ICD-10-CM

## 2012-08-14 DIAGNOSIS — M79609 Pain in unspecified limb: Secondary | ICD-10-CM | POA: Insufficient documentation

## 2012-08-14 DIAGNOSIS — S99919A Unspecified injury of unspecified ankle, initial encounter: Secondary | ICD-10-CM | POA: Insufficient documentation

## 2012-08-14 DIAGNOSIS — E1149 Type 2 diabetes mellitus with other diabetic neurological complication: Secondary | ICD-10-CM | POA: Insufficient documentation

## 2012-08-14 DIAGNOSIS — E1142 Type 2 diabetes mellitus with diabetic polyneuropathy: Secondary | ICD-10-CM | POA: Insufficient documentation

## 2012-08-14 DIAGNOSIS — X58XXXA Exposure to other specified factors, initial encounter: Secondary | ICD-10-CM | POA: Insufficient documentation

## 2012-08-14 DIAGNOSIS — S8990XA Unspecified injury of unspecified lower leg, initial encounter: Secondary | ICD-10-CM | POA: Insufficient documentation

## 2012-09-04 ENCOUNTER — Encounter (HOSPITAL_BASED_OUTPATIENT_CLINIC_OR_DEPARTMENT_OTHER): Payer: Medicaid Other | Attending: Internal Medicine

## 2012-09-04 DIAGNOSIS — L97509 Non-pressure chronic ulcer of other part of unspecified foot with unspecified severity: Secondary | ICD-10-CM | POA: Insufficient documentation

## 2012-09-04 DIAGNOSIS — E1169 Type 2 diabetes mellitus with other specified complication: Secondary | ICD-10-CM | POA: Insufficient documentation

## 2012-10-03 IMAGING — CR DG FOOT COMPLETE 3+V*L*
3 series · 3 of 3 positions shown · non-contrast
Comparison: 04/19/2011

CLINICAL DATA: Ulceration, diabetes

LEFT FOOT - COMPLETE 3+ VIEW

[t foot ap left]
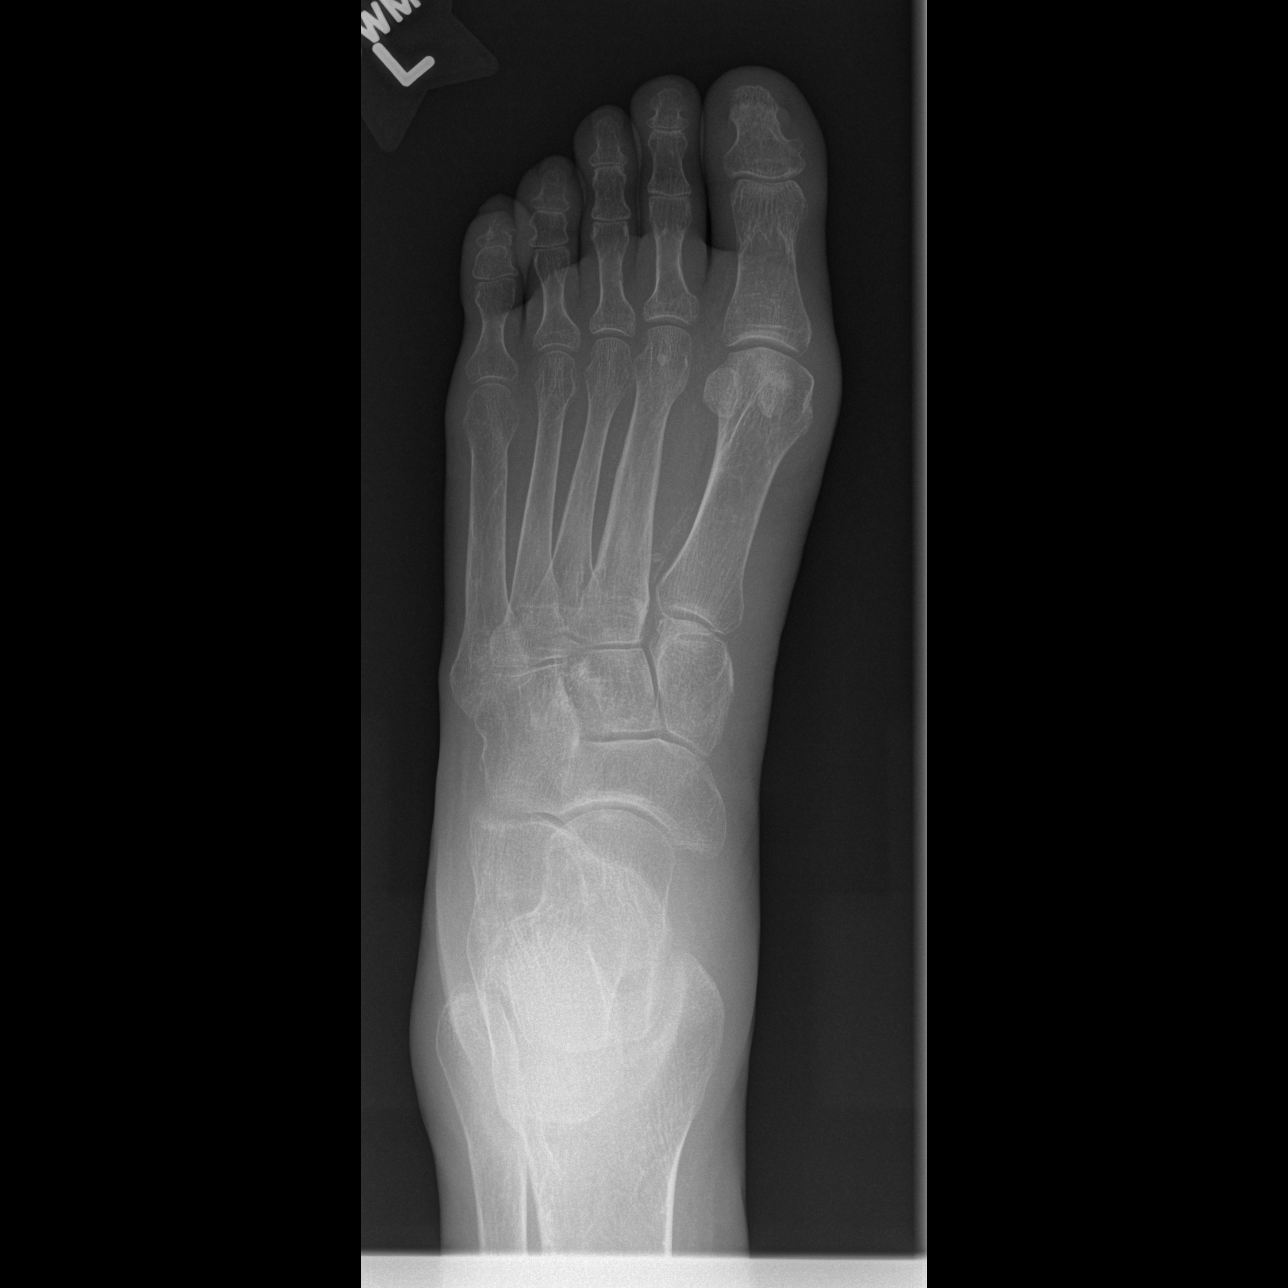

[t foot oblique left]
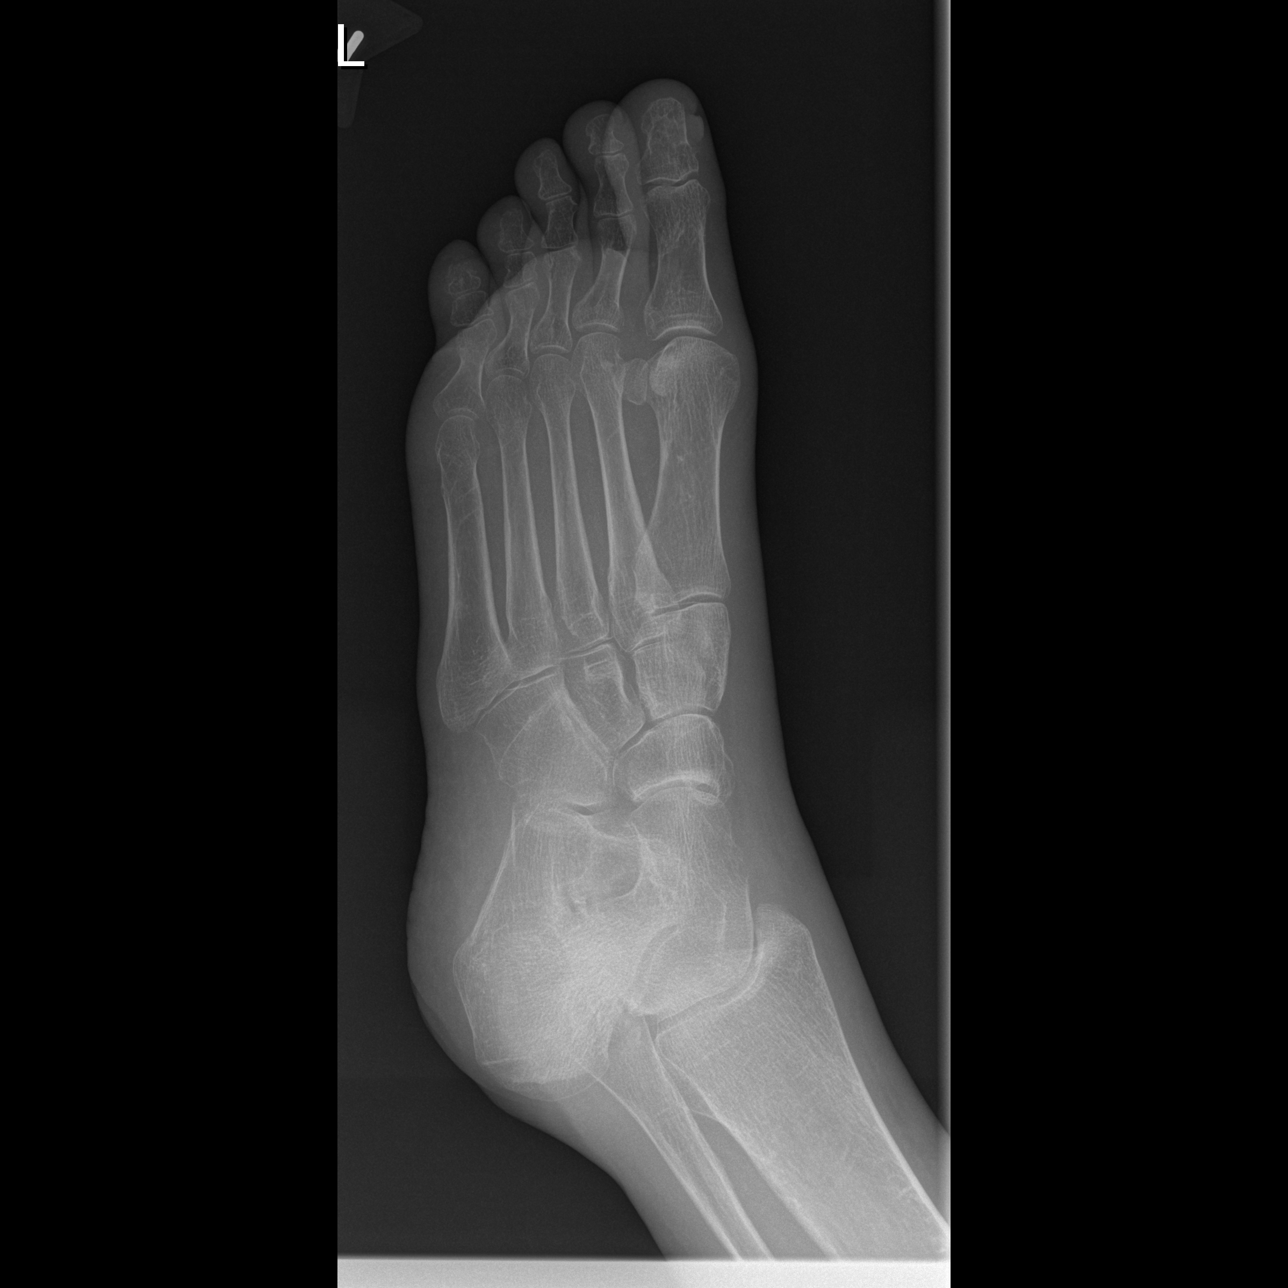

[t foot lat left]
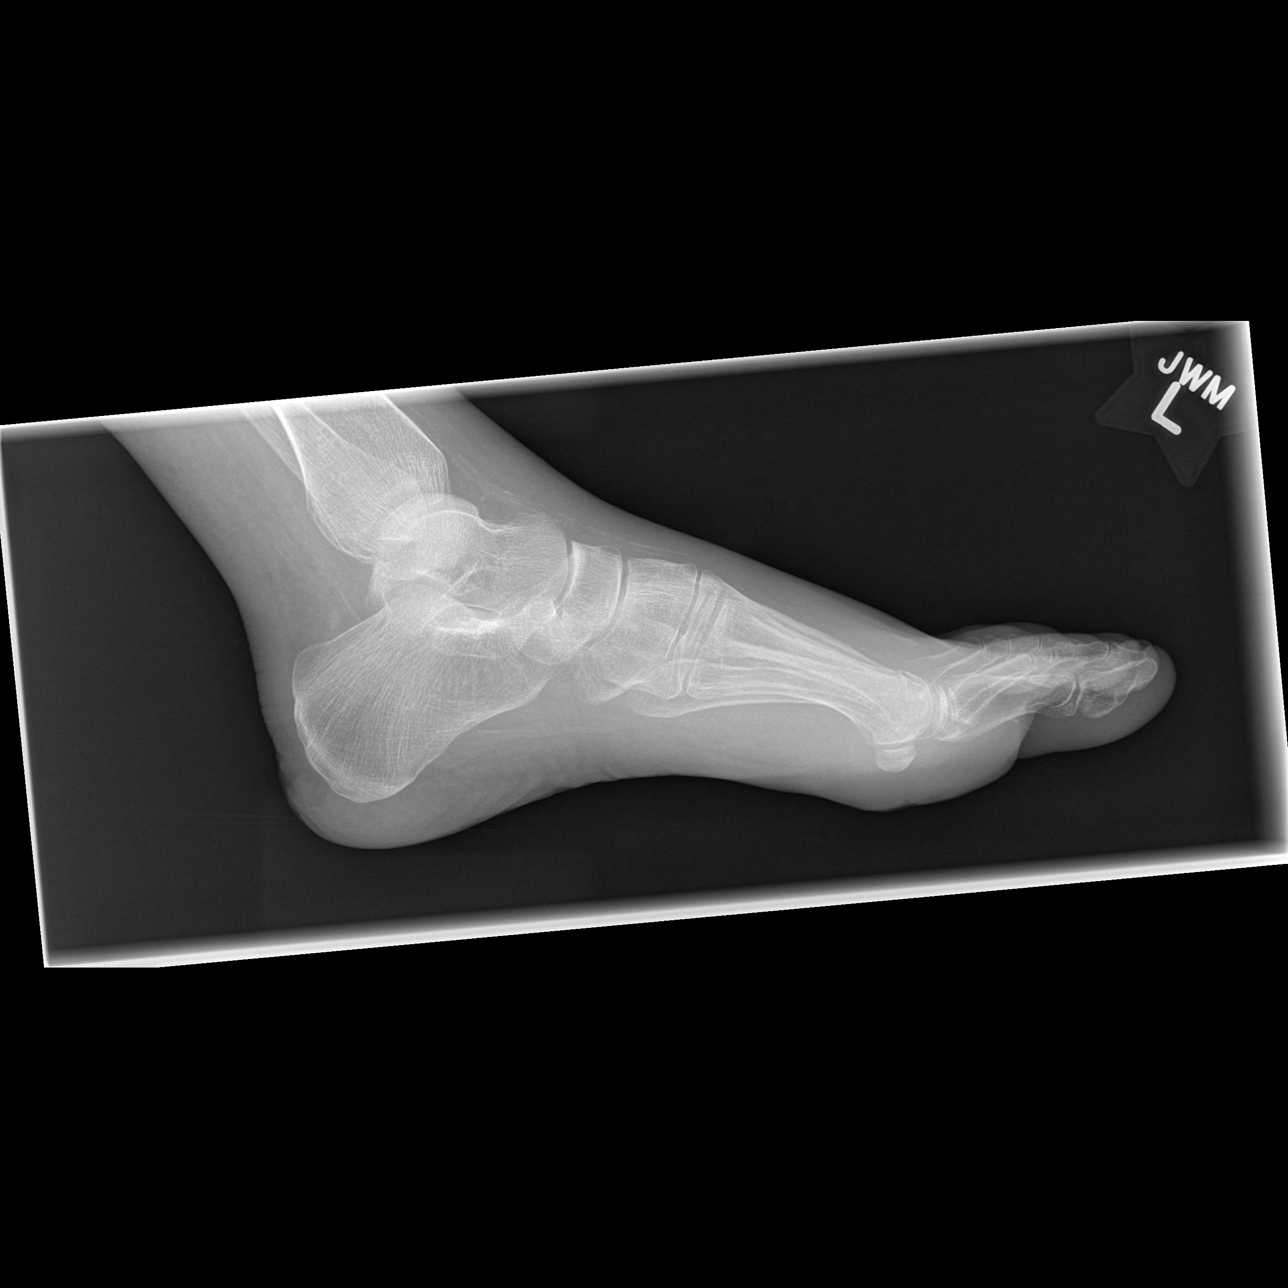

[3 of 3 positions shown; findings below may reference images not displayed]

FINDINGS: Diffuse osteopenia noted with peripheral calcific
atherosclerosis.  Normal alignment.  No displaced fracture.  No
radiopaque foreign body.  No bone destruction or significant
arthropathy.  Stable exam.
IMPRESSION: Osteopenia and peripheral atherosclerosis.  No acute osseous
abnormality.

## 2012-10-04 IMAGING — MR MR FOOT*L* WO/W CM
4 of 9 series · 19 of 40 positions shown · IV contrast (12    multi)
Comparison: Radiographs dated 05/09/2011

CLINICAL DATA: Cellulitis with pain, swelling, redness, and soft
tissue ulceration on the sole of the foot.

MRI OF THE LEFT FOREFOOT WITHOUT AND WITH CONTRAST
TECHNIQUE: Multiplanar, multisequence MR imaging was performed
both before and after administration of intravenous contrast.
Contrast: 12mL MULTIHANCE GADOBENATE DIMEGLUMINE 529 MG/ML IV SOLN

[Series 3: T1 · coronal · 3.0mm · 0.27mm/px · 5 of 36 slices shown (1 of 2)]
[im 1/36]
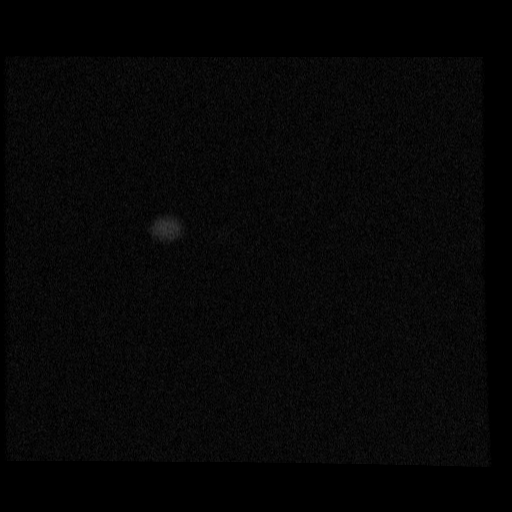
[im 9/36]
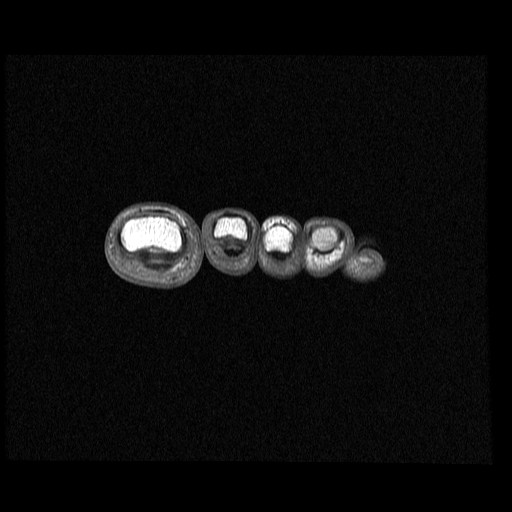
[im 18/36]
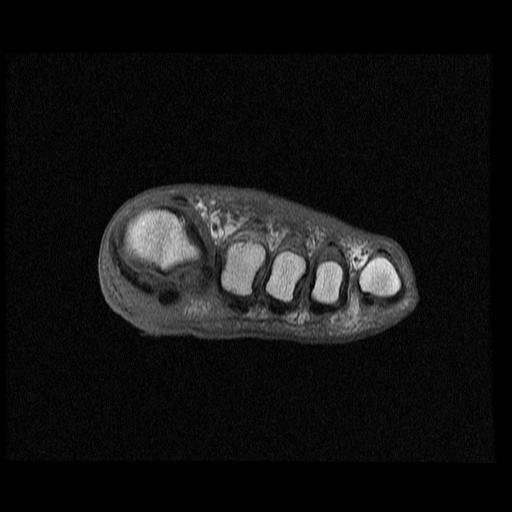
[im 27/36]
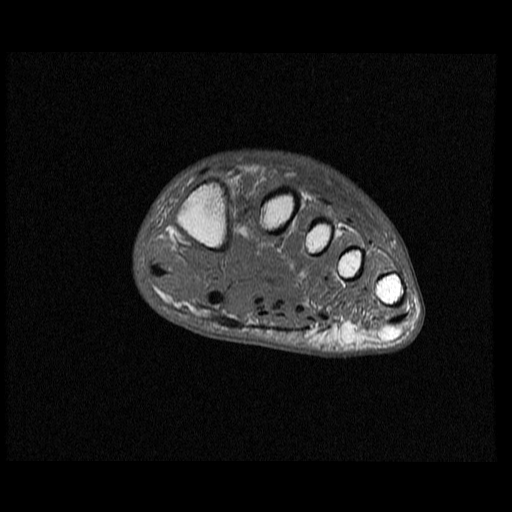
[im 36/36]
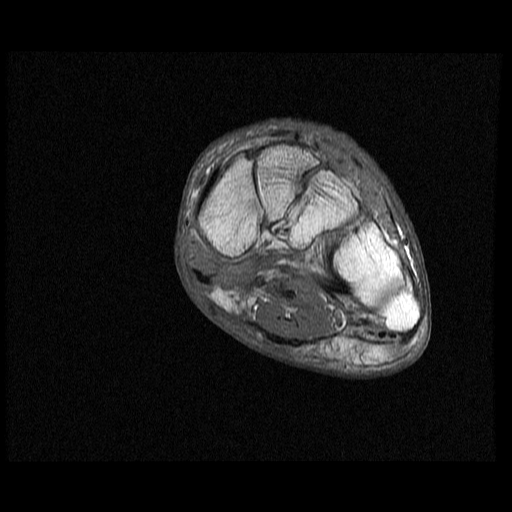

[Series 4: T2 fat-sat · coronal · 3.0mm · 0.27mm/px · 6 of 36 slices shown]
[im 1/36]
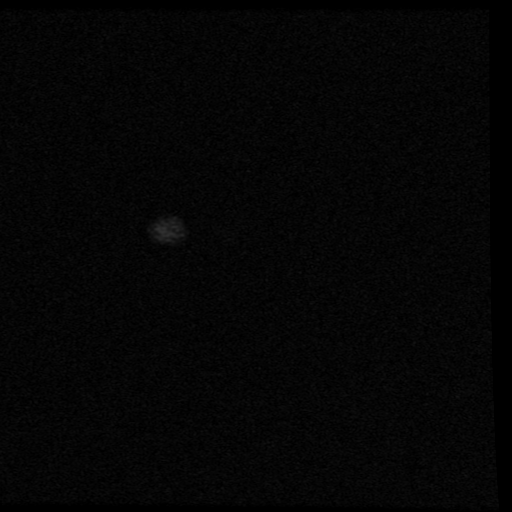
[im 8/36]
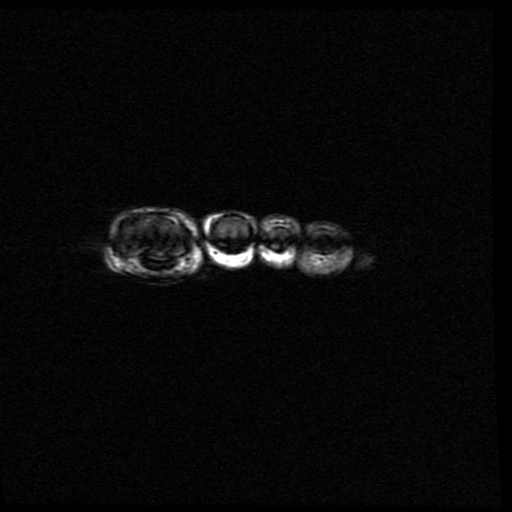
[im 15/36]
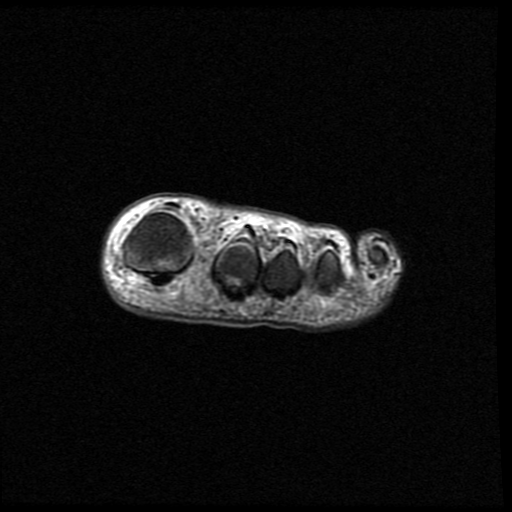
[im 22/36]
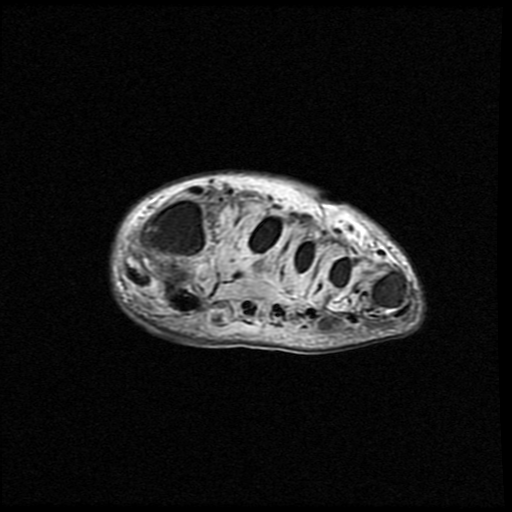
[im 29/36]
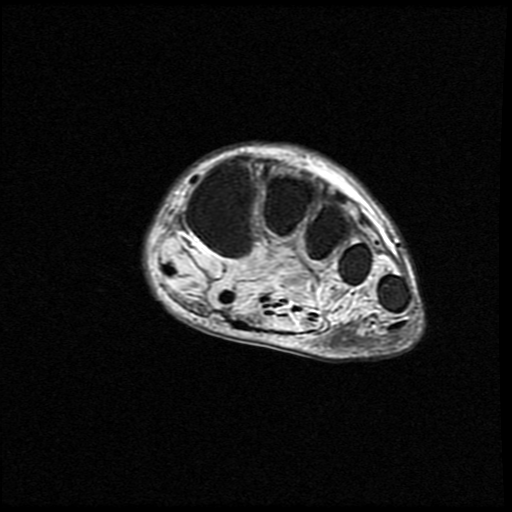
[im 36/36]
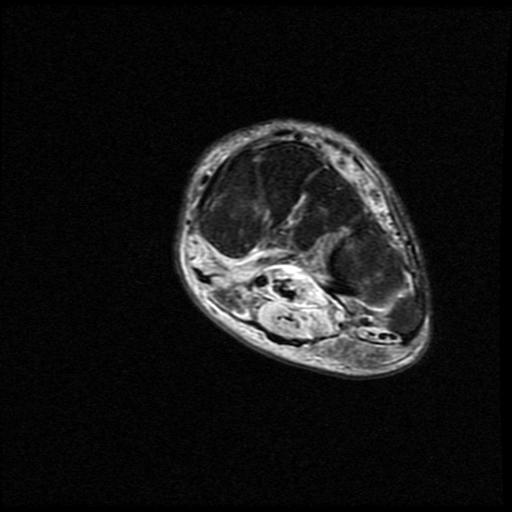

[Series 5: T1 · axial · 3.0mm · 0.27mm/px · z∈[-113,-47]mm · 3 of 18 slices shown (2 of 2)]
[im 1/18]
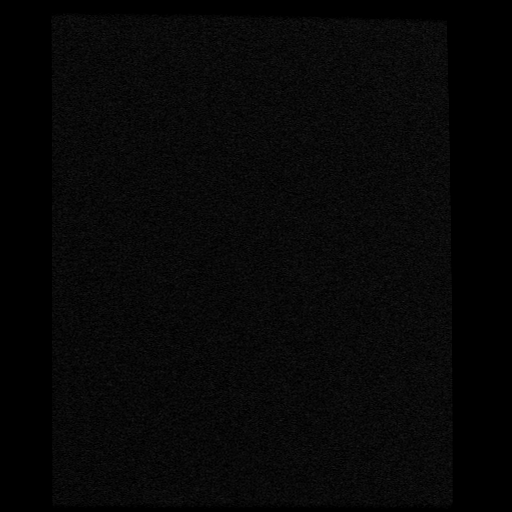
[im 9/18]
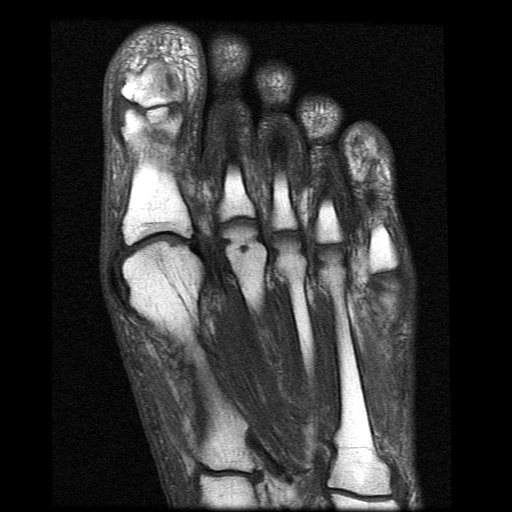
[im 18/18]
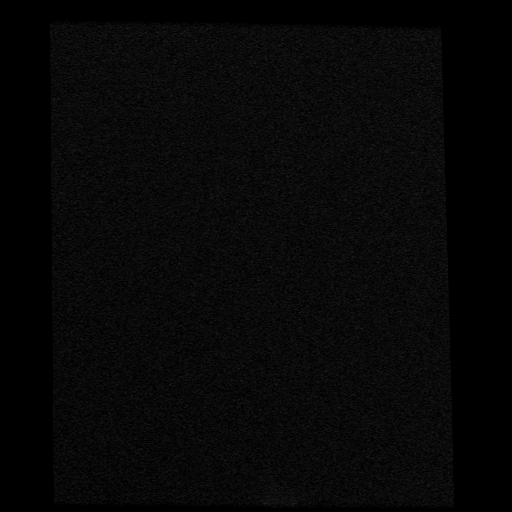

[Series 8: T1 fat-sat · coronal · 3.0mm · 0.27mm/px · 5 of 36 slices shown]
[im 1/36]
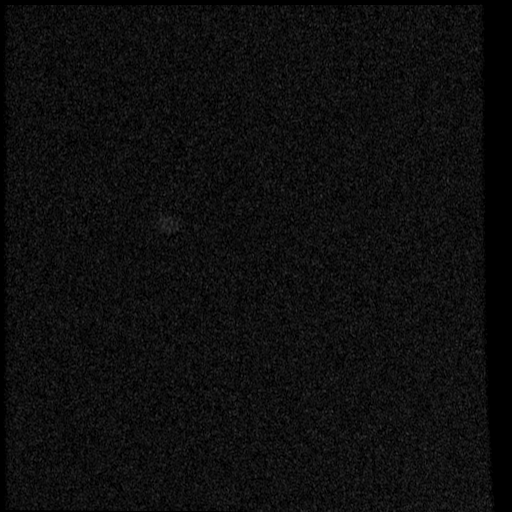
[im 8/36]
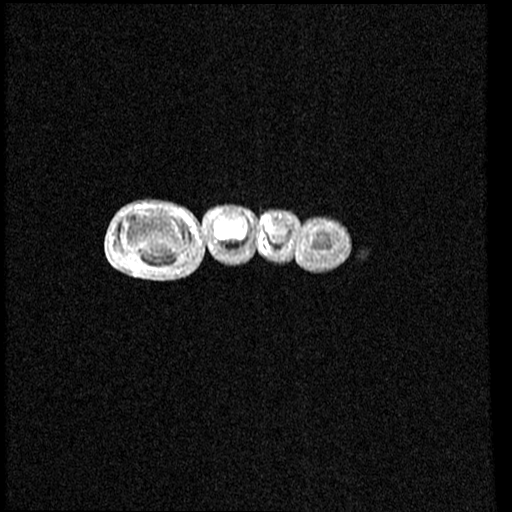
[im 15/36]
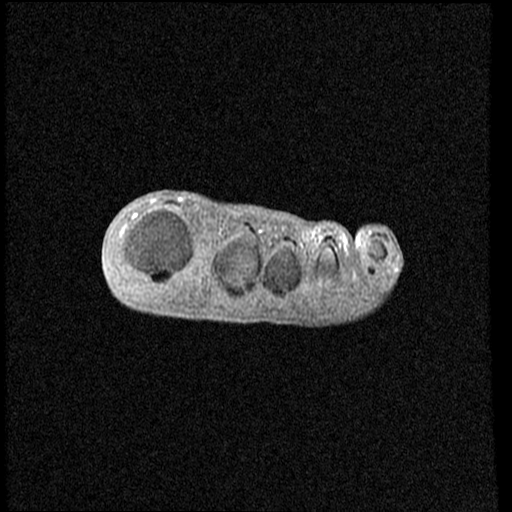
[im 22/36]
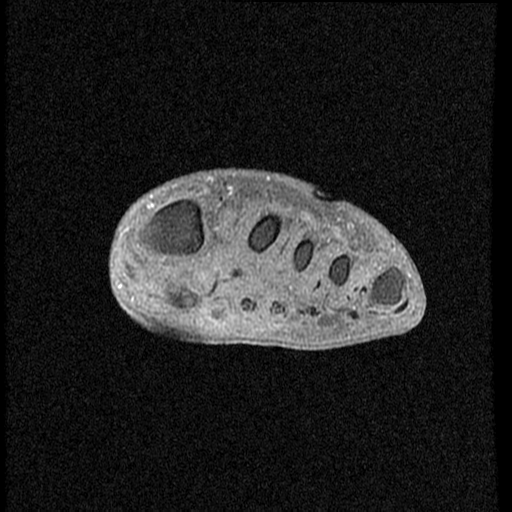
[im 36/36]
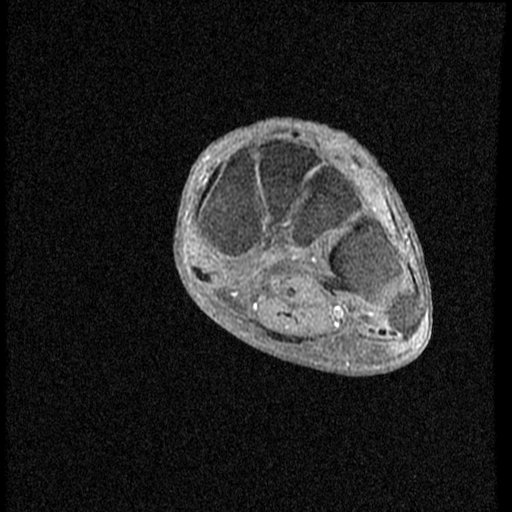

[19 of 40 positions shown; findings below may reference images not displayed]

FINDINGS: There is a soft tissue ulceration on the ball of the foot
just superficial to the head of the first metatarsal and the
sesamoids adjacent to the flexor tendon of the great toe.

The patient has abnormal edema in the medial and lateral sesamoids
and abnormal enhancement of both sesamoids after contrast
administration.  There are mild arthritic changes of the base of
the proximal phalangeal bone of the great toe.

There are no joint effusions and there is no evidence of
osteomyelitis of the phalangeal bones or of the metatarsals.  No
discrete soft tissue abscess.
IMPRESSION: Abnormal edema and enhancement of the medial and lateral sesamoids
adjacent to the head of the first metatarsal.  Findings are
consistent with osteomyelitis of the sesamoids.

No other evidence of osteomyelitis.  No soft tissue abscesses.  The
soft tissue ulceration is immediately superficial to the sesamoids.
The adjacent flexor tendon appears normal.

## 2012-10-26 ENCOUNTER — Emergency Department (HOSPITAL_BASED_OUTPATIENT_CLINIC_OR_DEPARTMENT_OTHER)
Admission: EM | Admit: 2012-10-26 | Discharge: 2012-10-27 | Disposition: A | Discharge status: Home / Self Care | Payer: Medicaid Other | Attending: Emergency Medicine | Admitting: Emergency Medicine

## 2012-10-26 ENCOUNTER — Encounter (HOSPITAL_BASED_OUTPATIENT_CLINIC_OR_DEPARTMENT_OTHER): Payer: Self-pay | Admitting: *Deleted

## 2012-10-26 ENCOUNTER — Emergency Department (HOSPITAL_BASED_OUTPATIENT_CLINIC_OR_DEPARTMENT_OTHER): Payer: Medicaid Other

## 2012-10-26 DIAGNOSIS — Z79899 Other long term (current) drug therapy: Secondary | ICD-10-CM | POA: Insufficient documentation

## 2012-10-26 DIAGNOSIS — Z7982 Long term (current) use of aspirin: Secondary | ICD-10-CM | POA: Insufficient documentation

## 2012-10-26 DIAGNOSIS — Z862 Personal history of diseases of the blood and blood-forming organs and certain disorders involving the immune mechanism: Secondary | ICD-10-CM | POA: Insufficient documentation

## 2012-10-26 DIAGNOSIS — L0390 Cellulitis, unspecified: Secondary | ICD-10-CM

## 2012-10-26 DIAGNOSIS — L03119 Cellulitis of unspecified part of limb: Secondary | ICD-10-CM | POA: Insufficient documentation

## 2012-10-26 DIAGNOSIS — Z794 Long term (current) use of insulin: Secondary | ICD-10-CM | POA: Insufficient documentation

## 2012-10-26 DIAGNOSIS — E7800 Pure hypercholesterolemia, unspecified: Secondary | ICD-10-CM | POA: Insufficient documentation

## 2012-10-26 DIAGNOSIS — Z87891 Personal history of nicotine dependence: Secondary | ICD-10-CM | POA: Insufficient documentation

## 2012-10-26 DIAGNOSIS — L02619 Cutaneous abscess of unspecified foot: Secondary | ICD-10-CM | POA: Insufficient documentation

## 2012-10-26 DIAGNOSIS — E119 Type 2 diabetes mellitus without complications: Secondary | ICD-10-CM | POA: Insufficient documentation

## 2012-10-26 DIAGNOSIS — I10 Essential (primary) hypertension: Secondary | ICD-10-CM | POA: Insufficient documentation

## 2012-10-26 MED ORDER — SULFAMETHOXAZOLE-TRIMETHOPRIM 800-160 MG PO TABS: 1.0000 | ORAL_TABLET | Freq: Two times a day (BID) | ORAL | Status: DC

## 2012-10-26 MED ORDER — VANCOMYCIN HCL IN DEXTROSE 1-5 GM/200ML-% IV SOLN
1000.0000 mg | Freq: Once | INTRAVENOUS | Status: AC
  Administered 2012-10-26: 1000 mg via INTRAVENOUS
  Filled 2012-10-26: qty 200

## 2012-10-26 MED ORDER — CEPHALEXIN 500 MG PO CAPS: 500.0000 mg | ORAL_CAPSULE | Freq: Three times a day (TID) | ORAL | Status: DC

## 2012-10-26 NOTE — ED Provider Notes (Signed)
CSN: [id number]     Arrival date & time 10/26/12  2049 History  This chart was scribed for Malvin Johns, MD by Jenne Campus, ED Scribe. This patient was seen in room MH05/MH05 and the patient's care was started at 10:16 PM.   First MD Initiated Contact with Patient 10/26/12 2116     Chief Complaint  Patient presents with  . Foot Pain    Patient is a 42 y.o. female presenting with lower extremity pain. The history is provided by the patient. No language interpreter was used.  Foot Pain This is a new problem. The current episode started 2 days ago. The problem occurs constantly. The problem has not changed since onset.Pertinent negatives include no headaches. The symptoms are aggravated by walking. Nothing relieves the symptoms. She has tried nothing for the symptoms.    HPI Comments: [name] is a 42 y.o. female who presents to the Emergency Department complaining of 2 to 3 days of constant left foot pain. She describes the pain as a dull ache that radiates up into the lower leg. She states that the pain is more pronounced in the lower left shin and is aggravated with ambulation. She lists increased warmth and redness of the left foot compared to the right as associated symptoms. She has a h/o diabetic neuropathy in bilateral feet and states that she normally doesn't feel pain in her foot so any pain causes her concern.  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 She denies any recent injuries. She denies fevers and chills as associated symptoms. She reports that she has chronic bilateral leg swelling and is on lisinopril to improve it. She denies any changes with the swelling.  PCP is PA Margaretha Sheffield   Past Medical History  Diagnosis Date  . Diabetes mellitus   . Neuropathy in diabetes   . Hypertension   . Hypercholesteremia   . H/O seasonal allergies   . Anemia   . Left foot infection    Past Surgical History  Procedure  Laterality Date  . Cesarean section    . Eye surgery    . Hemrrhoidectomy    . Peripherally inserted central catheter insertion     History reviewed. No pertinent family history. History  Substance Use Topics  . Smoking status: Former Smoker    Quit date: 05/16/2008  . Smokeless tobacco: Not on file  . Alcohol Use: 0.5 oz/week    1 drink(s) per week     Comment: Occasional   No OB history provided.  Review of Systems  Constitutional: Negative for fever and chills.  HENT: Negative for neck pain.   Gastrointestinal: Negative for nausea and vomiting.  Musculoskeletal: Positive for arthralgias (left foot pain). Negative for back pain and joint swelling.  Skin: Negative for wound.  Neurological: Negative for weakness, numbness and headaches.    Allergies  Ciprofloxacin and Cleocin  Home Medications   Current Outpatient Rx  Name  Route  Sig  Dispense  Refill  . albuterol (PROVENTIL HFA;VENTOLIN HFA) 108 (90 BASE) MCG/ACT inhaler   Inhalation   Inhale 2 puffs into the lungs every 6 (six) hours as needed. S.O.B.         . amLODipine (NORVASC) 5 MG tablet   Oral   Take 5 mg by mouth daily.         Marland Kitchen aspirin 325 MG tablet   Oral   Take 325 mg by mouth daily.         Marland Kitchen  azithromycin (ZITHROMAX) 250 MG tablet   Oral   Take 1 tablet (250 mg total) by mouth daily. Take first 2 tablets together, then 1 every day until finished.   6 tablet   0   . cephALEXin (KEFLEX) 500 MG capsule   Oral   Take 1 capsule (500 mg total) by mouth 3 (three) times daily.   21 capsule   0   . clindamycin (CLEOCIN) 300 MG capsule   Oral   Take 1 capsule (300 mg total) by mouth 3 (three) times daily.   21 capsule   0   . furosemide (LASIX) 20 MG tablet   Oral   Take 20 mg by mouth daily.          Marland Kitchen guaiFENesin-codeine (ROBITUSSIN AC) 100-10 MG/5ML syrup   Oral   Take 5 mLs by mouth 3 (three) times daily as needed for cough.   120 mL   0   . HYDROcodone-acetaminophen  (NORCO/VICODIN) 5-325 MG per tablet   Oral   Take 2 tablets by mouth every 6 (six) hours as needed for pain.   6 tablet   0   . insulin glargine (LANTUS) 100 UNIT/ML injection   Subcutaneous   Inject 10 Units into the skin 2 (two) times daily.          . insulin lispro (HUMALOG) 100 UNIT/ML injection   Subcutaneous   Inject 2-7 Units into the skin 3 (three) times daily before meals. Pt on sliding scale         . lisinopril (PRINIVIL,ZESTRIL) 2.5 MG tablet   Oral   Take 2.5 mg by mouth daily.         . pravastatin (PRAVACHOL) 20 MG tablet   Oral   Take 20 mg by mouth daily.         Marland Kitchen sulfamethoxazole-trimethoprim (BACTRIM DS,SEPTRA DS) 800-160 MG per tablet   Oral   Take 1 tablet by mouth 2 (two) times daily. One po bid x 3 days   14 tablet   0   . sulfamethoxazole-trimethoprim (SEPTRA DS) 800-160 MG per tablet   Oral   Take 1 tablet by mouth every 12 (twelve) hours.   20 tablet   0    Triage Vitals: BP 133/74  Pulse 94  Temp(Src) 98.5 F (36.9 C) (Oral)  Resp 18  Ht 5\' 7"  (1.702 m)  Wt 125 lb (56.7 kg)  BMI 19.57 kg/m2  SpO2 100%  Physical Exam  Nursing note and vitals reviewed. Constitutional: She is oriented to person, place, and time. She appears well-developed and well-nourished.  HENT:  Head: Normocephalic and atraumatic.  Neck: Normal range of motion. Neck supple.  Cardiovascular: Normal rate.   Pulmonary/Chest: Effort normal.  Musculoskeletal: She exhibits edema and tenderness.  Minimal erythema to the dorsum of the left foot and ankle, increased warmth to touch of the left foot, faint palpable distal pulses, no posterior calf tenderness  Neurological: She is alert and oriented to person, place, and time.  Decreased sensation in the left foot which is chronic and at baseline per pt  Skin: Skin is warm and dry.  Psychiatric: She has a normal mood and affect. Her behavior is normal.    ED Course   Procedures (including critical care  time)  DIAGNOSTIC STUDIES: Oxygen Saturation is 100% on room air, normal by my interpretation.    COORDINATION OF CARE: 10:23 PM-Informed pt of negative radiology results. Discussed discharge plan which includes antibiotics, ice and elevation with  pt and pt agreed to plan. Also advised pt to follow up with PCP as needed and pt agreed. Addressed symptoms to return for with pt.   Dg Ankle Complete Left  10/26/2012   *RADIOLOGY REPORT*  Clinical Data: Diabetic neuropathy.  Left foot inflammation. Erythema and painful for several days.  LEFT ANKLE COMPLETE - 3+ VIEW  Comparison: Ankle radiographs 07/02/2011.  Findings: Diabetic small vessel atherosclerotic disease is present. Osteopenia.  Ankle mortise is congruent.  Talar dome is intact. There is no fracture about the ankle.  Refer to foot radiographs today for further evaluation.  IMPRESSION: No acute osseous abnormality.   Original Report Authenticated By: Dereck Ligas, M.D.   Dg Foot Complete Left  10/26/2012   *RADIOLOGY REPORT*  Clinical Data: Left foot pain.  Inflammation and erythema.  LEFT FOOT - COMPLETE 3+ VIEW  Comparison: Multiple priors, most recently dating 08/14/2012.  Findings: Pes planus is present.  Midfoot degenerative changes are present.  Small vessel diabetic atherosclerotic calcification is present.  Compared to the prior exam, there are no interval changes.  Post-traumatic changes are present at the third and second metatarsal base and intermetatarsal joints.  Moderate first MTP joint osteoarthritis.  Compared to the prior exam, there is no interval change.  The midfoot degenerative changes are most compatible with a combination of post-traumatic and neuropathic foot.  Significantly, no focal area of osteolysis are present to suggest osteomyelitis.  Mild bunion incidentally noted.  IMPRESSION: No acute abnormality.  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   Original Report  Authenticated By: Dereck Ligas, M.D.   1. Cellulitis     MDM  No fracture is identified. She has symptoms suggestive of a possible early cellulitis. She has some small abrasions to her big toe of her left foot. There is no drainage or signs of infection locally. She has some mild swelling in the leg but she says actually better than it normally is. It is not appear to be suggestive of DVT. I advised her to followup with her primary care physician in 2 days for recheck. We will start her on antibiotics and she was given one dose of IV antibiotics here in the ED. Advised to return here for symptoms worsen.  I personally performed the services described in this documentation, which was scribed in my presence.  The recorded information has been reviewed and considered.    Malvin Johns, MD 10/26/12 2306

## 2012-10-26 NOTE — ED Notes (Signed)
Pt reports hx of diabetic neuropathy in bilateral feet.  Reports that her (L) foot became inflamed, warm to touch and painful x 2-3 days.

## 2012-10-27 IMAGING — CR DG CHEST 1V
1 series · 1 of 1 positions shown · non-contrast
Comparison: 12/14/2007

CLINICAL DATA: PICC line unable to be accessed last night.  History
of diabetes and hypertension.

CHEST - 1 VIEW

[w chest pa]
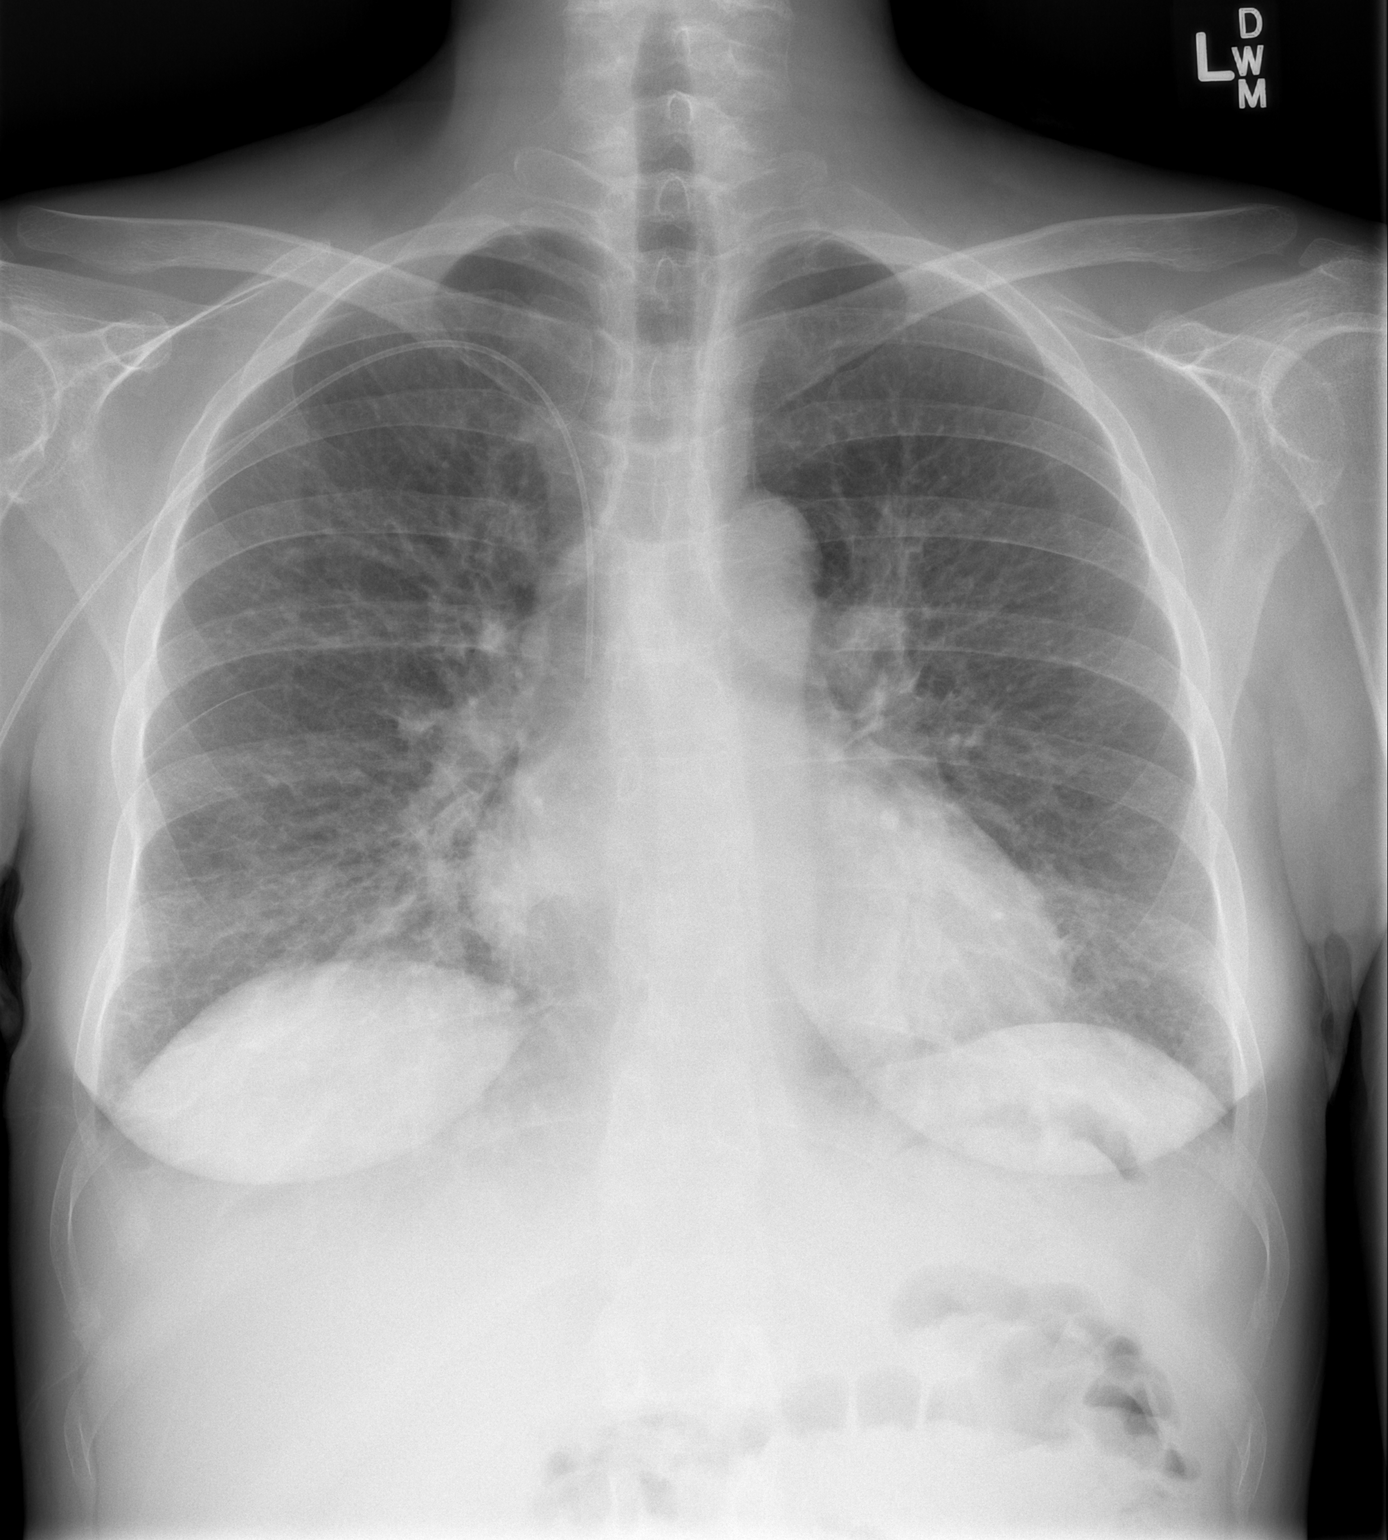

[1 of 1 positions shown; findings below may reference images not displayed]

FINDINGS: Right PICC line tip overlies the level of the superior
vena cava.  Heart is enlarged.  There are patchy densities in the
lung bases bilaterally, right greater than left and consistent with
infectious infiltrate or interstitial edema.  Suspect small right
pleural effusion.  No evidence for pneumothorax.
IMPRESSION: 1.  Cardiomegaly and bilateral infiltrates, right greater than
left.
2.  Right-sided PICC line.

## 2012-10-27 NOTE — ED Notes (Signed)
rx x 2 given for keflex and septra

## 2012-10-30 ENCOUNTER — Telehealth: Payer: Self-pay | Admitting: *Deleted

## 2012-10-30 NOTE — Telephone Encounter (Signed)
[employer] called and said they faxed you over an order for this patient, at first you said you wouldn't sign it until she was seen and she's been seen now, they would like the CMN faxed back to them please

## 2012-11-06 ENCOUNTER — Telehealth: Payer: Self-pay | Admitting: *Deleted

## 2012-11-06 NOTE — Telephone Encounter (Signed)
Please call the patient, she needs to set up a followup appointment and repeat foot exam first

## 2012-11-06 NOTE — Telephone Encounter (Signed)
Pt said she saw you within the last month, and she does not understand why she needs to come back. I tried to schedule her an appt but it says a referral is needed. Please adivse, Patient is very unhappy

## 2012-11-06 NOTE — Telephone Encounter (Signed)
Ok will sign form, she needs an appointment for poor diabetes control and we need to schedule one anyway

## 2012-11-06 NOTE — Telephone Encounter (Signed)
Inez Catalina from [facility] called about the CMN she faxed for this patient, it's in your "to sign" bin.

## 2012-11-07 NOTE — Telephone Encounter (Signed)
Form faxed to [facility]

## 2012-11-10 NOTE — Telephone Encounter (Signed)
Form has been sent, she needs to make a followup appointment in about a month for her diabetes and also get records released

## 2012-11-10 NOTE — Telephone Encounter (Signed)
Pt is aware to make follow up appt

## 2012-11-16 ENCOUNTER — Emergency Department (HOSPITAL_BASED_OUTPATIENT_CLINIC_OR_DEPARTMENT_OTHER): Payer: Medicaid Other

## 2012-11-16 ENCOUNTER — Emergency Department (HOSPITAL_BASED_OUTPATIENT_CLINIC_OR_DEPARTMENT_OTHER)
Admission: EM | Admit: 2012-11-16 | Discharge: 2012-11-17 | Disposition: A | Discharge status: Home / Self Care | Payer: Medicaid Other | Attending: Emergency Medicine | Admitting: Emergency Medicine

## 2012-11-16 ENCOUNTER — Encounter (HOSPITAL_BASED_OUTPATIENT_CLINIC_OR_DEPARTMENT_OTHER): Payer: Self-pay | Admitting: *Deleted

## 2012-11-16 DIAGNOSIS — G589 Mononeuropathy, unspecified: Secondary | ICD-10-CM | POA: Insufficient documentation

## 2012-11-16 DIAGNOSIS — E1169 Type 2 diabetes mellitus with other specified complication: Secondary | ICD-10-CM | POA: Insufficient documentation

## 2012-11-16 DIAGNOSIS — E11621 Type 2 diabetes mellitus with foot ulcer: Secondary | ICD-10-CM

## 2012-11-16 DIAGNOSIS — E1149 Type 2 diabetes mellitus with other diabetic neurological complication: Secondary | ICD-10-CM | POA: Insufficient documentation

## 2012-11-16 DIAGNOSIS — Z87891 Personal history of nicotine dependence: Secondary | ICD-10-CM | POA: Insufficient documentation

## 2012-11-16 DIAGNOSIS — I10 Essential (primary) hypertension: Secondary | ICD-10-CM | POA: Insufficient documentation

## 2012-11-16 DIAGNOSIS — Z792 Long term (current) use of antibiotics: Secondary | ICD-10-CM | POA: Insufficient documentation

## 2012-11-16 DIAGNOSIS — E7800 Pure hypercholesterolemia, unspecified: Secondary | ICD-10-CM | POA: Insufficient documentation

## 2012-11-16 DIAGNOSIS — Z794 Long term (current) use of insulin: Secondary | ICD-10-CM | POA: Insufficient documentation

## 2012-11-16 DIAGNOSIS — Z862 Personal history of diseases of the blood and blood-forming organs and certain disorders involving the immune mechanism: Secondary | ICD-10-CM | POA: Insufficient documentation

## 2012-11-16 DIAGNOSIS — Z7982 Long term (current) use of aspirin: Secondary | ICD-10-CM | POA: Insufficient documentation

## 2012-11-16 DIAGNOSIS — Z9109 Other allergy status, other than to drugs and biological substances: Secondary | ICD-10-CM | POA: Insufficient documentation

## 2012-11-16 DIAGNOSIS — Z79899 Other long term (current) drug therapy: Secondary | ICD-10-CM | POA: Insufficient documentation

## 2012-11-16 DIAGNOSIS — L97509 Non-pressure chronic ulcer of other part of unspecified foot with unspecified severity: Secondary | ICD-10-CM | POA: Insufficient documentation

## 2012-11-16 NOTE — ED Notes (Signed)
Left foot pain with wound on plantar surface of foot and 2nd wound on great toe pt report hx of wound infection same foot and has been using silver impreg drsg to treat that was used by wound care agency in recent past. Pt reports pain occurred after walking all day [place] [employer].

## 2012-11-16 NOTE — ED Notes (Signed)
Pt c/o "open wounds"  To right foot. Posterior foot and right toe area. States she has been in wound care last time was 2 months ago. Hx of diabetes and states not able to feel her feet. Toe nail missing on to right toe and small yellow ulcerated area to right toe. Denies fevers. States feels like a burning type pain

## 2012-11-16 NOTE — ED Provider Notes (Signed)
CSN: [id number]     Arrival date & time 11/16/12  2145 History  This chart was scribed for Veryl Speak, MD by Elby Beck, ED Scribe. This patient was seen in room MH09/MH09 and the patient's care was started at 11:22 PM.    Chief Complaint  Patient presents with  . skin infection     The history is provided by the patient. No language interpreter was used.    HPI Comments: [name] is a 42 y.o. female who presents to the Emergency Department complaining of multiple small "open wounds" to the plantar surface and great toe of her right foot, which she believes are infected. She states that she was walking in new shoes yesterday, and when she took the shoes off, she noticed the areas. She reports associated burning pain and warmth to the areas that radiates up her legs. She also reports a history of DM and states that she has neuropathy and numbness in her feet. She denies fever, chills, nausea, vomiting or any other symptoms. She is a former smoker and an occasional alcohol user.   Past Medical History  Diagnosis Date  . Diabetes mellitus   . Neuropathy in diabetes   . Hypertension   . Hypercholesteremia   . H/O seasonal allergies   . Anemia   . Left foot infection    Past Surgical History  Procedure Laterality Date  . Cesarean section    . Eye surgery    . Hemrrhoidectomy    . Peripherally inserted central catheter insertion     No family history on file. History  Substance Use Topics  . Smoking status: Former Smoker    Quit date: 05/16/2008  . Smokeless tobacco: Not on file  . Alcohol Use: 0.5 oz/week    1 drink(s) per week     Comment: Occasional   OB History   Grav Para Term Preterm Abortions TAB SAB Ect Mult Living                 Review of Systems A complete 10 system review of systems was obtained and all systems are negative except as noted in the HPI and PMH.   Allergies  Ciprofloxacin and Cleocin  Home Medications   Current Outpatient Rx   Name  Route  Sig  Dispense  Refill  . albuterol (PROVENTIL HFA;VENTOLIN HFA) 108 (90 BASE) MCG/ACT inhaler   Inhalation   Inhale 2 puffs into the lungs every 6 (six) hours as needed. S.O.B.         . amLODipine (NORVASC) 5 MG tablet   Oral   Take 5 mg by mouth daily.         Marland Kitchen aspirin 325 MG tablet   Oral   Take 325 mg by mouth daily.         Marland Kitchen azithromycin (ZITHROMAX) 250 MG tablet   Oral   Take 1 tablet (250 mg total) by mouth daily. Take first 2 tablets together, then 1 every day until finished.   6 tablet   0   . cephALEXin (KEFLEX) 500 MG capsule   Oral   Take 1 capsule (500 mg total) by mouth 3 (three) times daily.   21 capsule   0   . clindamycin (CLEOCIN) 300 MG capsule   Oral   Take 1 capsule (300 mg total) by mouth 3 (three) times daily.   21 capsule   0   . furosemide (LASIX) 20 MG tablet   Oral  Take 20 mg by mouth daily.          Marland Kitchen guaiFENesin-codeine (ROBITUSSIN AC) 100-10 MG/5ML syrup   Oral   Take 5 mLs by mouth 3 (three) times daily as needed for cough.   120 mL   0   . HYDROcodone-acetaminophen (NORCO/VICODIN) 5-325 MG per tablet   Oral   Take 2 tablets by mouth every 6 (six) hours as needed for pain.   6 tablet   0   . insulin glargine (LANTUS) 100 UNIT/ML injection   Subcutaneous   Inject 10 Units into the skin 2 (two) times daily.          . insulin lispro (HUMALOG) 100 UNIT/ML injection   Subcutaneous   Inject 2-7 Units into the skin 3 (three) times daily before meals. Pt on sliding scale         . lisinopril (PRINIVIL,ZESTRIL) 2.5 MG tablet   Oral   Take 2.5 mg by mouth daily.         . pravastatin (PRAVACHOL) 20 MG tablet   Oral   Take 20 mg by mouth daily.         Marland Kitchen sulfamethoxazole-trimethoprim (BACTRIM DS,SEPTRA DS) 800-160 MG per tablet   Oral   Take 1 tablet by mouth 2 (two) times daily. One po bid x 3 days   14 tablet   0   . sulfamethoxazole-trimethoprim (SEPTRA DS) 800-160 MG per tablet   Oral    Take 1 tablet by mouth every 12 (twelve) hours.   20 tablet   0    Triage Vitals: BP 138/73  Pulse 92  Temp(Src) 99.8 F (37.7 C) (Oral)  Resp 20  Ht 5' 7.5" (1.715 m)  Wt 131 lb (59.421 kg)  BMI 20.2 kg/m2  SpO2 100%  LMP 10/18/2012  Physical Exam  Nursing note and vitals reviewed. Constitutional: She is oriented to person, place, and time. She appears well-developed and well-nourished.  HENT:  Head: Normocephalic and atraumatic.  Eyes: Conjunctivae and EOM are normal.  Neck: Normal range of motion. Neck supple. No tracheal deviation present.  Cardiovascular: Normal rate and regular rhythm.   Pulmonary/Chest: Effort normal and breath sounds normal. No respiratory distress.  Abdominal: Soft. She exhibits no distension.  Musculoskeletal: Normal range of motion. She exhibits no edema.  The bottom of the left foot is noted to have a small, less than 0.5 cm ulcer. There is surrounding erythema and warmth, but no drainage.   Neurological: She is alert and oriented to person, place, and time.  Skin: Skin is warm and dry. No rash noted.  Psychiatric: She has a normal mood and affect.    ED Course   Procedures (including critical care time)  DIAGNOSTIC STUDIES: Oxygen Saturation is 100% on RA, normal by my interpretation.    COORDINATION OF CARE: 11:27 PM- Pt advised of plan for diagnostic radiology and pt agrees.  12:19 AM- Pt advised of radiology findings indicating no foreign bodies. She agrees with plan to be discharged with a prescription for Augmentin. She was counseled on how to care for her wounds at home.    Labs Reviewed - No data to display  Dg Foot Complete Left  11/17/2012   *RADIOLOGY REPORT*  Clinical Data: The patient has open wounds.  LEFT FOOT - COMPLETE 3+ VIEW  Comparison: 08/14/2012 and 10/26/2012.  Findings: Diffuse bone demineralization.  Degenerative changes at the first metatarsophalangeal joint and within multiple intertarsal joints.  Past planus.   No evidence of  acute fracture or subluxation of the left foot.  There is a focal skin defect with underlying swelling of the plantar aspect of the metatarsal region.  No radiopaque foreign bodies are demonstrated.  No evidence of bone erosion or cortical loss to suggest osteomyelitis.  IMPRESSION: Plantar soft tissue swelling and skin defect consistent with ulceration.  No radiopaque soft tissue foreign bodies.  No radiographic findings suggesting osteomyelitis.  Chronic bone demineralization, degenerative changes, and past planus similar previous study.   Original Report Authenticated By: Lucienne Capers, M.D.    1. Diabetic foot ulcer     MDM  Patient with a history of insulin-dependent diabetes who presents with complaints of a sore on the bottom of her foot. She tells me she was at a golf tournament and walking around quite a bit. She denies stepping on any foreign material and does not feel as though she has a foreign body present. X-rays do not reveal a foreign body in there is no evidence for osteomyelitis. My concerns are for a developing diabetic foot ulcer. She was advised to keep the area clean and dressed and will be treated with Augmentin. She is to followup in the next one to 2 weeks, sooner if she worsens.       I personally performed the services described in this documentation, which was scribed in my presence. The recorded information has been reviewed and is accurate.      Veryl Speak, MD 11/17/12 [phone]

## 2012-11-17 MED ORDER — AMOXICILLIN-POT CLAVULANATE 500-125 MG PO TABS: 1.0000 | ORAL_TABLET | Freq: Three times a day (TID) | ORAL | Status: DC

## 2012-11-21 ENCOUNTER — Encounter (HOSPITAL_BASED_OUTPATIENT_CLINIC_OR_DEPARTMENT_OTHER): Payer: Medicaid Other | Attending: General Surgery

## 2012-11-21 DIAGNOSIS — L97509 Non-pressure chronic ulcer of other part of unspecified foot with unspecified severity: Secondary | ICD-10-CM | POA: Insufficient documentation

## 2012-11-21 DIAGNOSIS — Z794 Long term (current) use of insulin: Secondary | ICD-10-CM | POA: Insufficient documentation

## 2012-11-21 DIAGNOSIS — E1049 Type 1 diabetes mellitus with other diabetic neurological complication: Secondary | ICD-10-CM | POA: Insufficient documentation

## 2012-11-21 DIAGNOSIS — E1069 Type 1 diabetes mellitus with other specified complication: Secondary | ICD-10-CM | POA: Insufficient documentation

## 2012-11-21 DIAGNOSIS — E1142 Type 2 diabetes mellitus with diabetic polyneuropathy: Secondary | ICD-10-CM | POA: Insufficient documentation

## 2012-11-22 NOTE — H&P (Signed)
NAMELAJOYCE, BONITO NO.:  [id number]  MEDICAL RECORD NO.:  [id number]  LOCATION:  FOOT                         FACILITY:  [facility]  PHYSICIAN:  Elesa Hacker, M.D.        DATE OF BIRTH:  [birth year]  DATE OF ADMISSION:  11/21/2012 DATE OF DISCHARGE:                             HISTORY & PHYSICAL   CHIEF COMPLAINT:  Wound, left foot.  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 Elayne Snare.  She says her diabetic control has not been very good lately.  She has a long history of neuropathy with very poor sensation.  On the bottom of her foot, she has had a wound in the similar location several years ago.  She was walking this weekend a great deal of time and developed a painful wound on the plantar surface of the left foot.  She was seen in the emergency room and put on antibiotics and sent to [place].  PAST MEDICAL HISTORY:  Significant for hypertension, hypercholesterolemia, allergies, and anemia.  SURGICAL HISTORY:  Hemorrhoidectomy, laser surgery, DUI, and C-section.  Cigarettes none, although she has smoked in the past.  Alcohol occasionally.  MEDICATIONS:  Insulin, lisinopril, and Augmentin.  ALLERGIES:  CIPRO caused itching.  REVIEW OF SYSTEMS:  As above.  PHYSICAL EXAMINATION:  VITAL SIGNS:  Temperature 98.2, pulse 100, respirations 18, blood pressure 158/89 __________. CHEST:  Clear. HEART:  Regular rhythm. ABDOMEN:  Not examined. EXTREMITIES:  Examination of the left lower extremity reveals an ABI of 1.06.  Dorsalis pedis and posterior tibial pulses are palpable.  There is a small wound surrounded by unhealthy dead skin.  This skin was excised leaving a wound approximately 2 x 2 cm with a shaggy base.  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  PLAN:  We will start with Santyl and Hydrogel, and almost certainly she will need offloading with easy cast.     Elesa Hacker,  M.D.     RA/MEDQ  D:  11/21/2012  T:  11/21/2012  Job:  [id number]

## 2012-11-26 IMAGING — CR DG FOOT COMPLETE 3+V*L*
3 series · 3 of 3 positions shown · non-contrast
Comparison: May 2011

CLINICAL DATA: Pain and swelling

LEFT FOOT - COMPLETE 3+ VIEW

[view not recorded (1 of 3)]
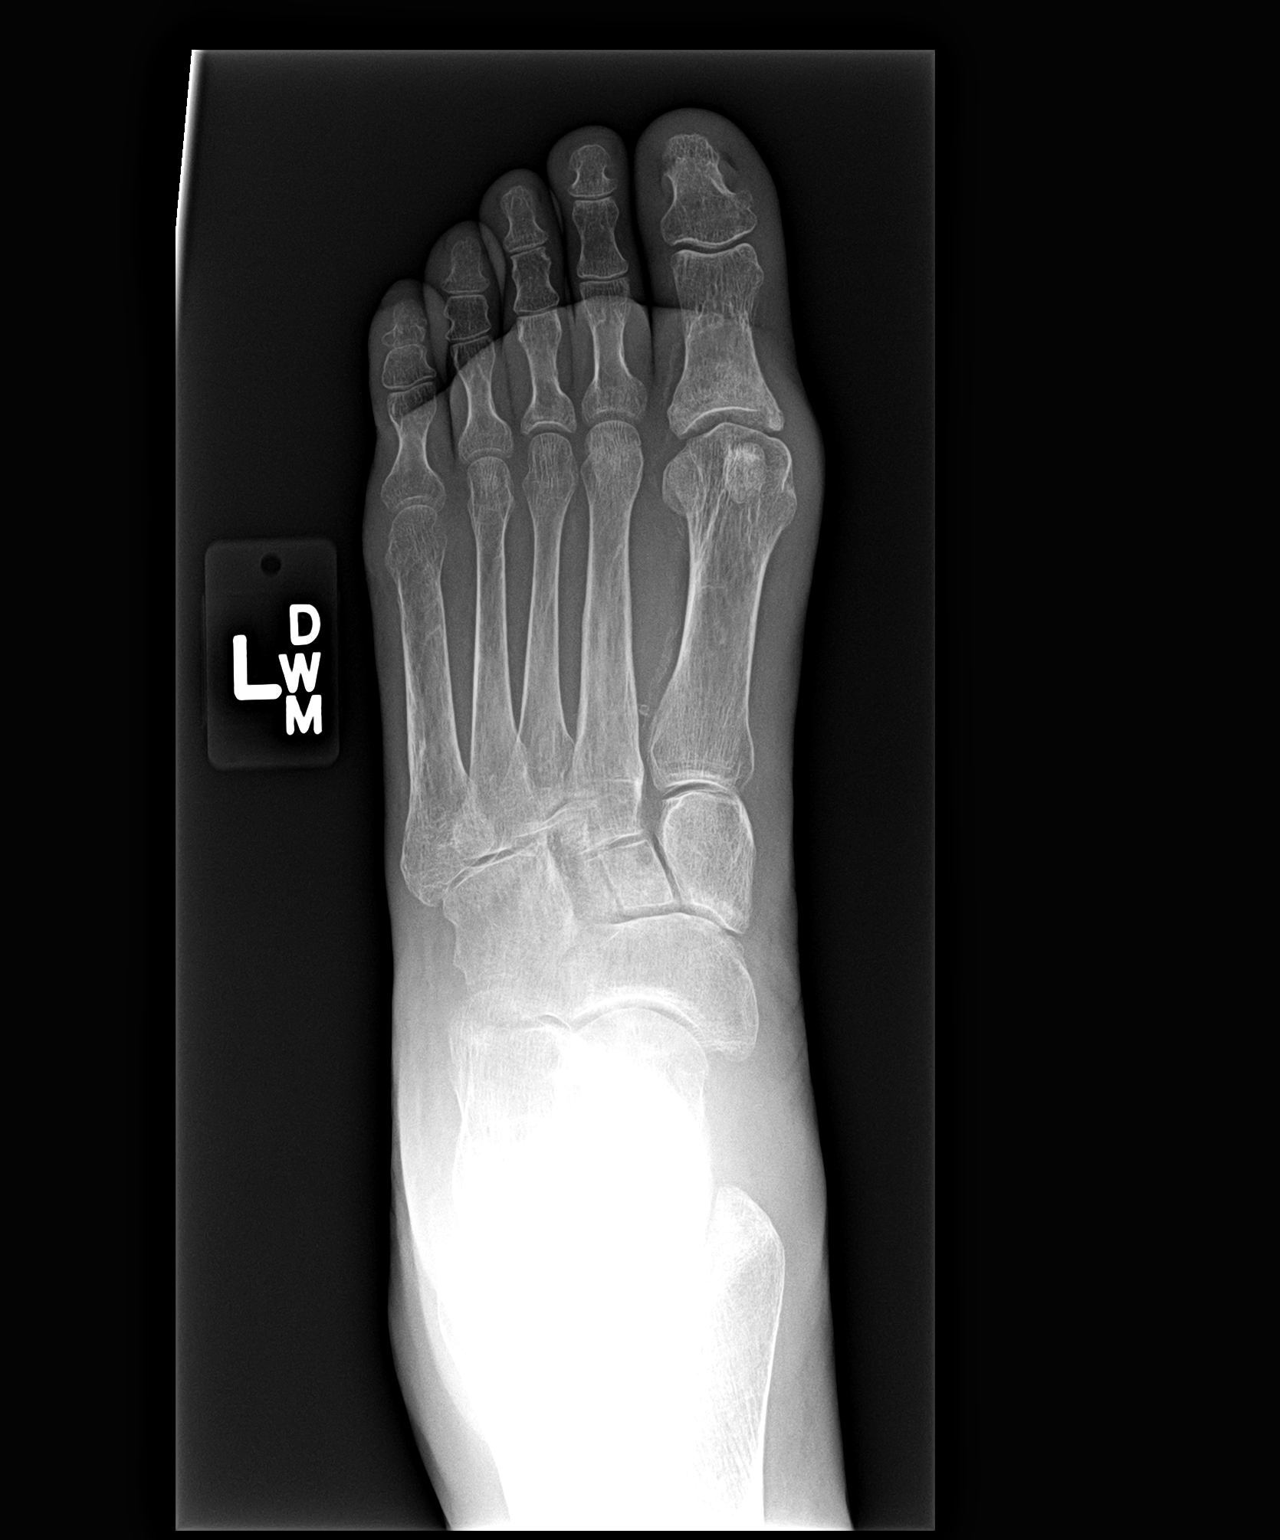

[view not recorded (2 of 3)]
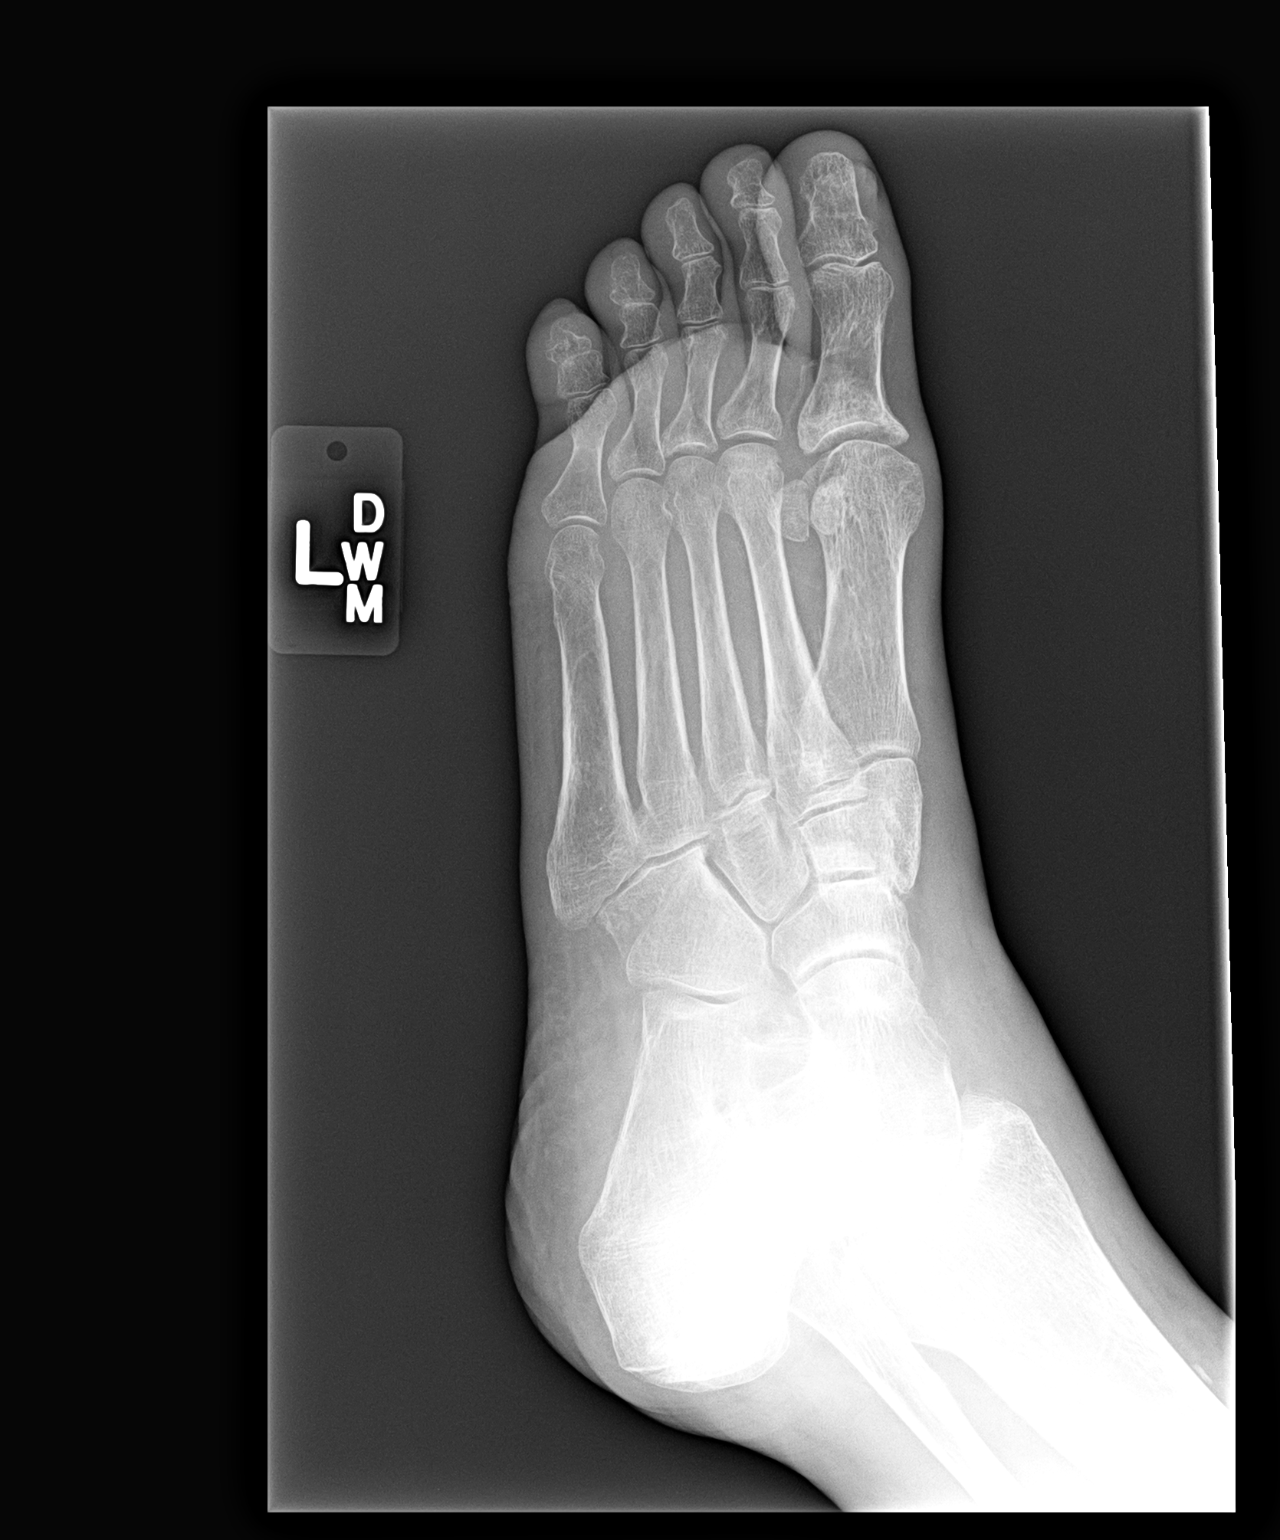

[view not recorded (3 of 3)]
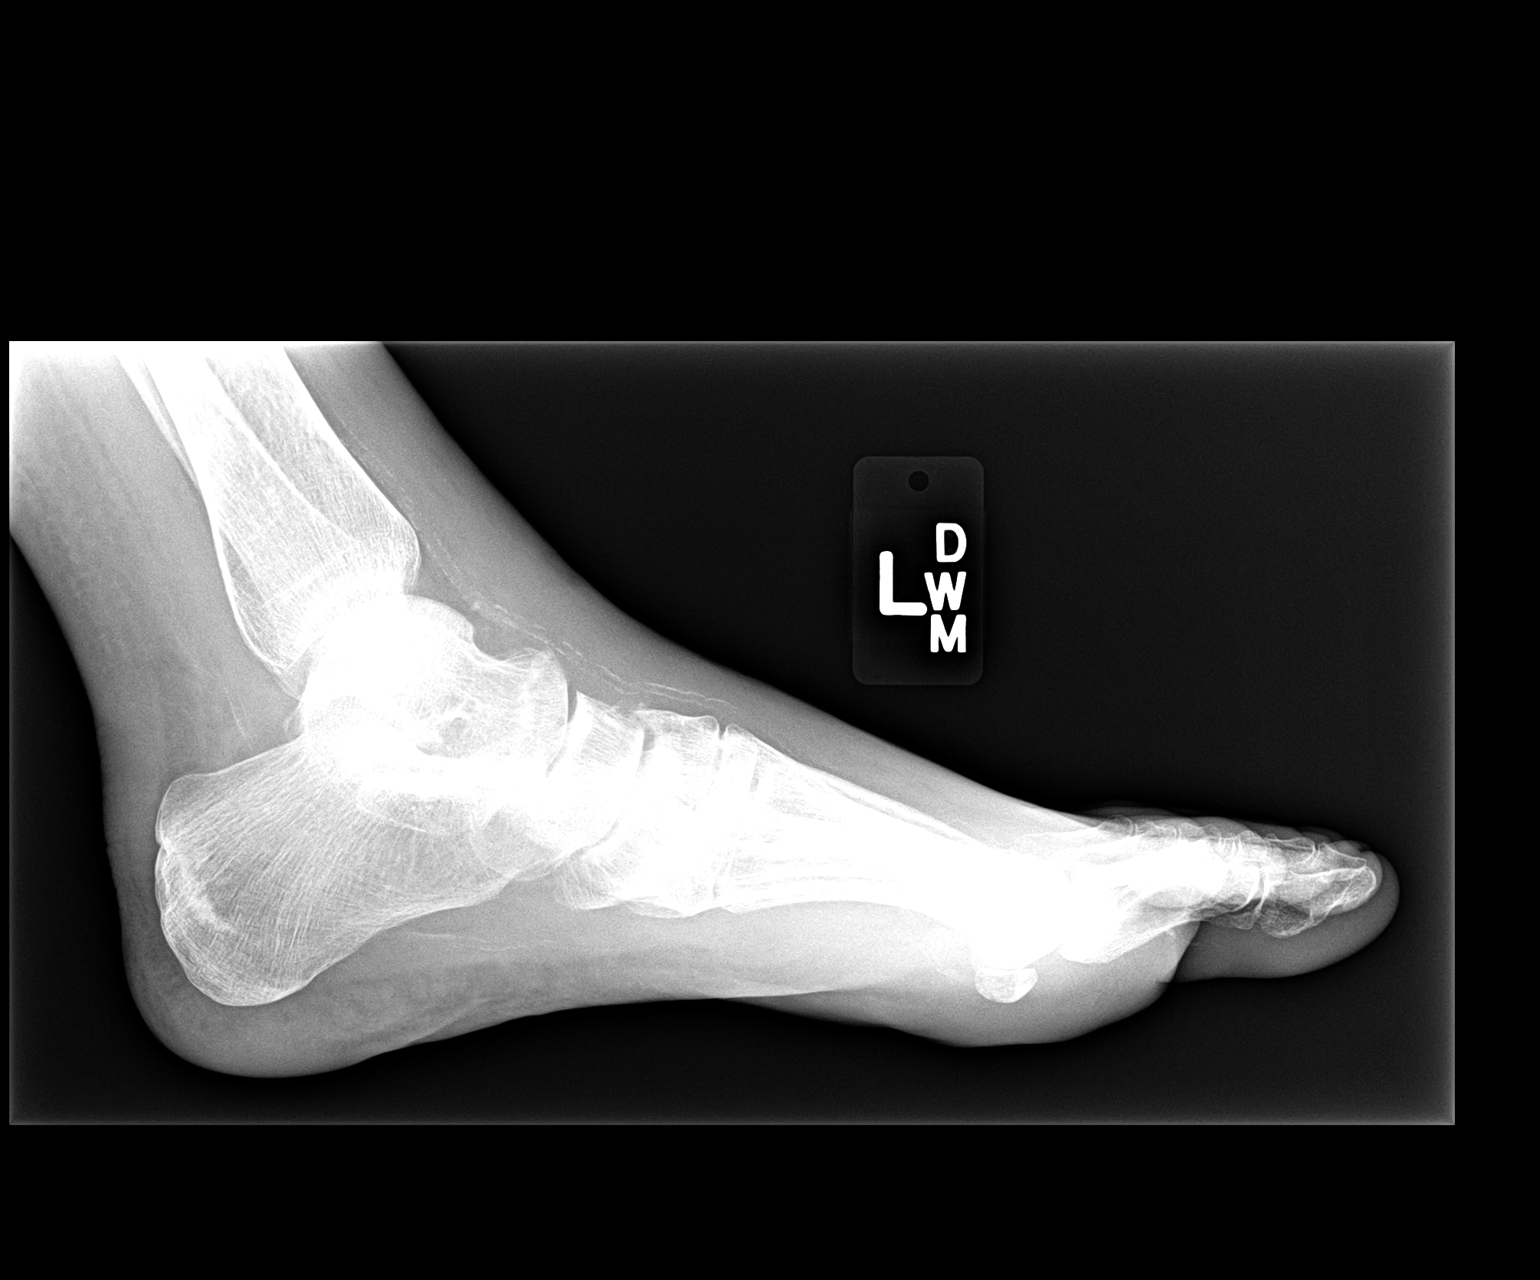

[3 of 3 positions shown; findings below may reference images not displayed]

FINDINGS: The bones are osteopenic.  There is vascular
calcification.  There is no radiographic evidence of osteomyelitis.
No fracture or other focal lesion.  Specific attention to the
sesamoid bones does not show any worrisome finding by radiography.
IMPRESSION: Negative

## 2012-11-26 IMAGING — US US EXTREM LOW VENOUS*L*
1 series · 14 of 24 positions shown · non-contrast
Comparison: None.

CLINICAL DATA: Swelling of the left leg

LEFT LOWER EXTREMITY VENOUS DUPLEX ULTRASOUND
TECHNIQUE: Gray-scale sonography with graded compression, as well
as color Doppler and duplex ultrasound, were performed to evaluate
the deep venous system of the lower extremity from the level of the
common femoral vein through the popliteal and proximal calf veins.
Spectral Doppler was utilized to evaluate flow at rest and with
distal augmentation maneuvers.

[Series 1: us extrem low venous*left* · 14 of 29 slices shown]
[im 1/29]
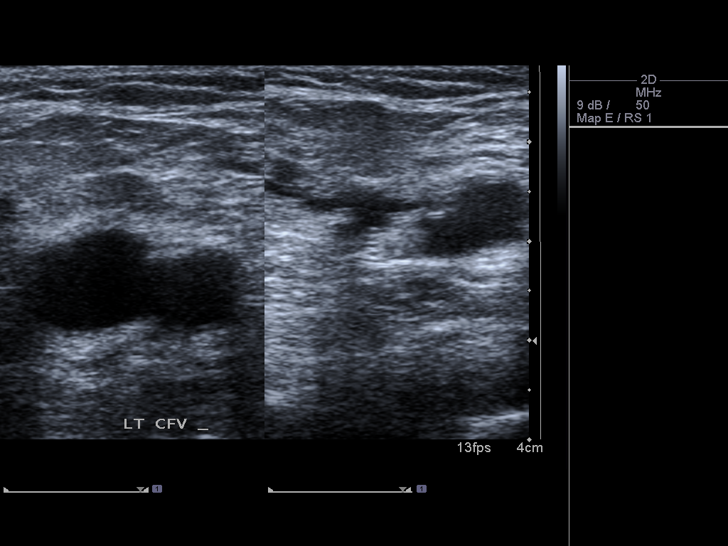
[im 3/29]
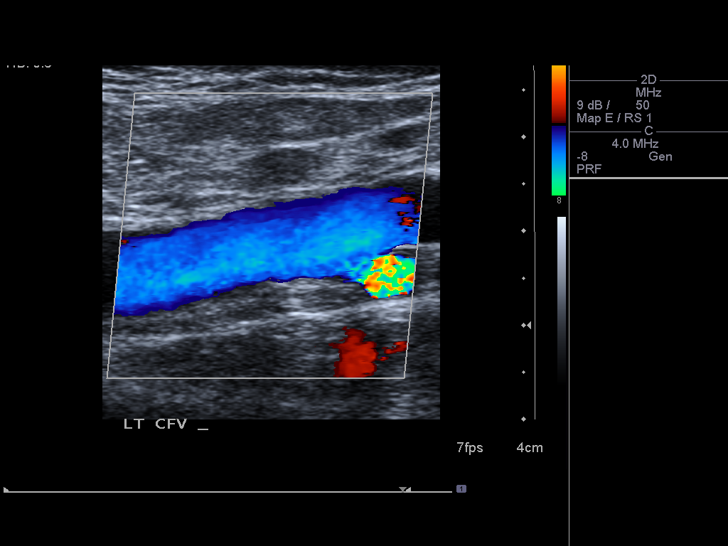
[im 5/29]
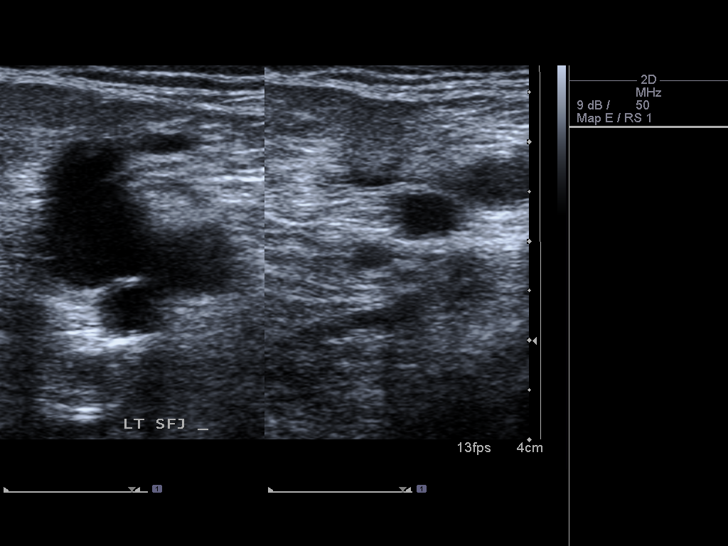
[im 8/29]
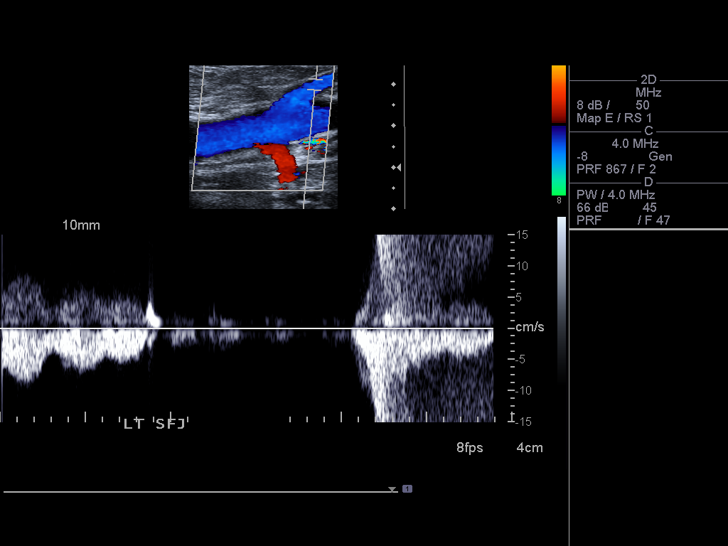
[im 9/29]
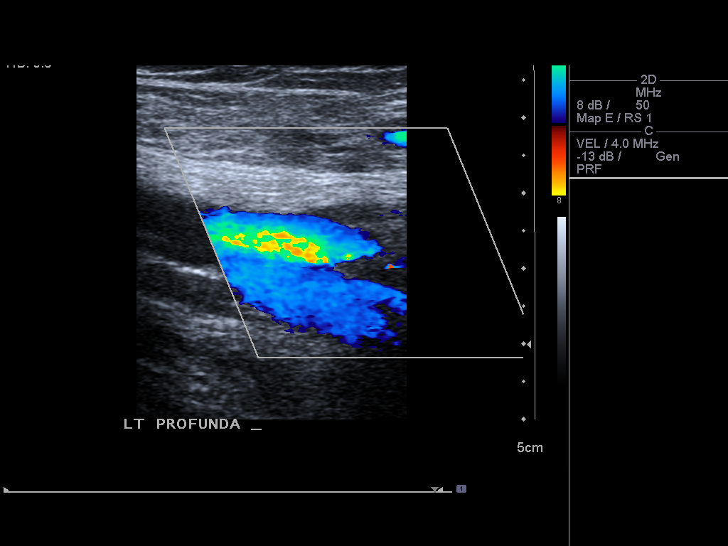
[im 11/29]
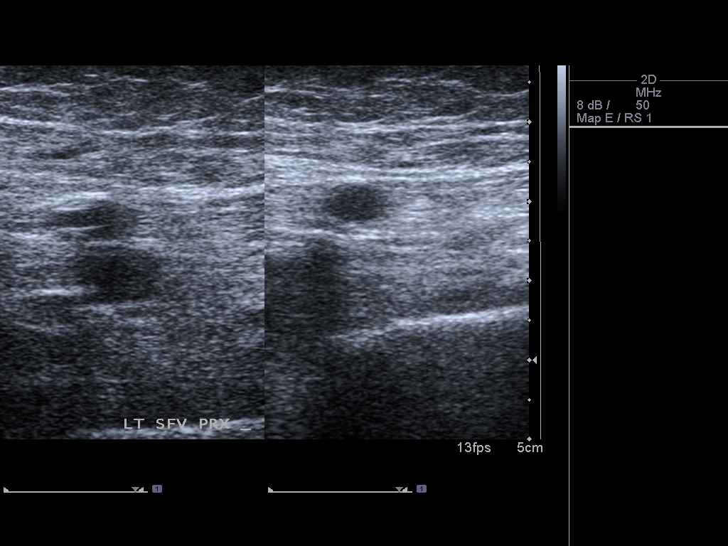
[im 14/29]
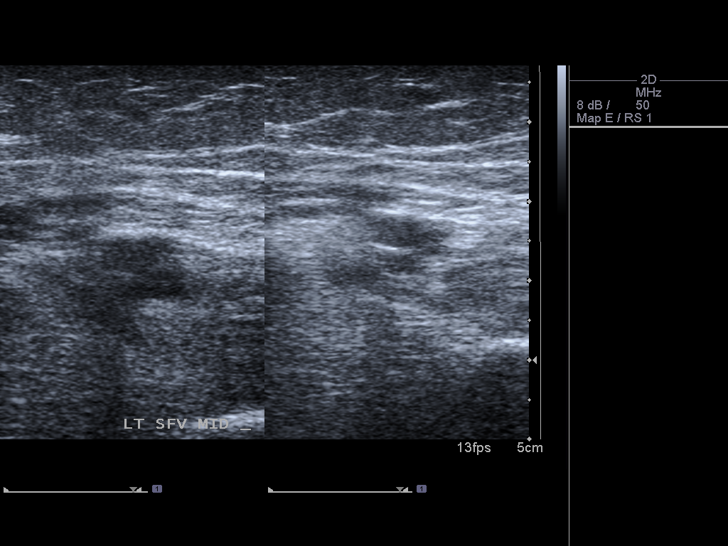
[im 15/29]
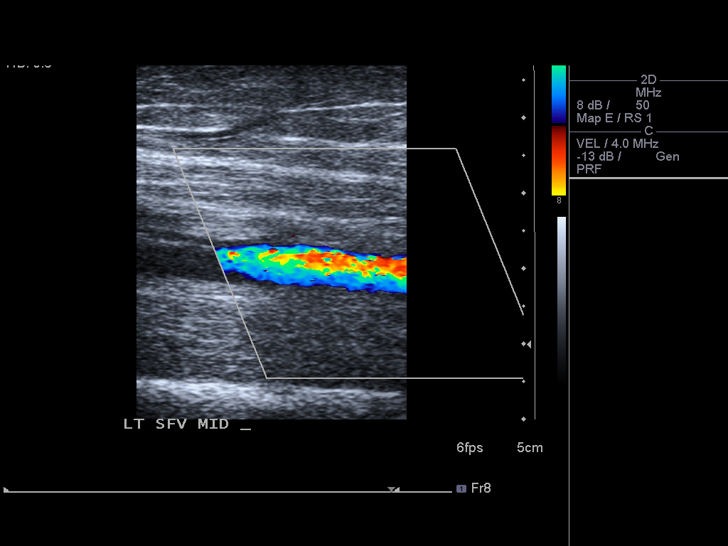
[im 18/29]
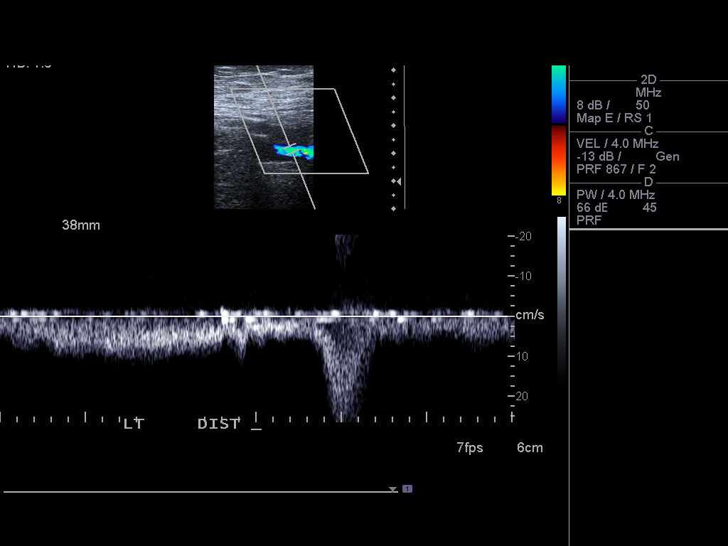
[im 20/29]
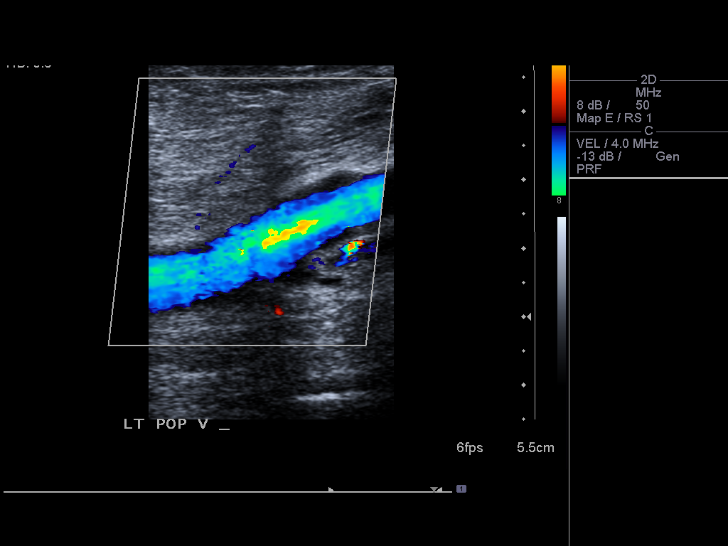
[im 22/29]
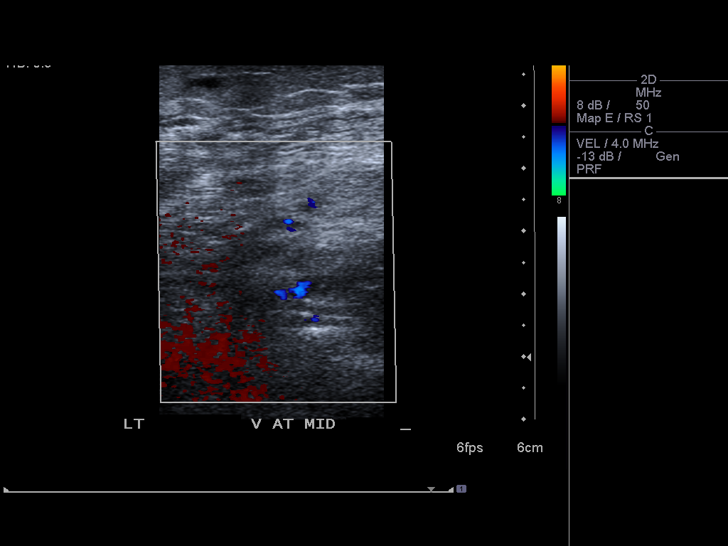
[im 24/29]
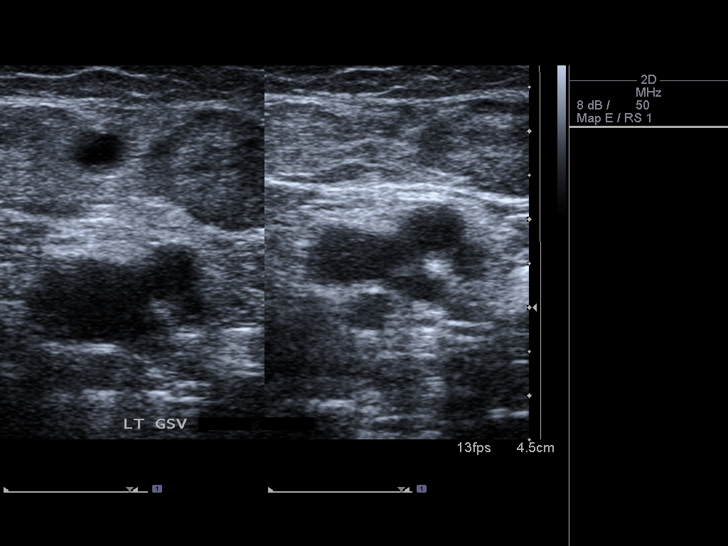
[im 26/29]
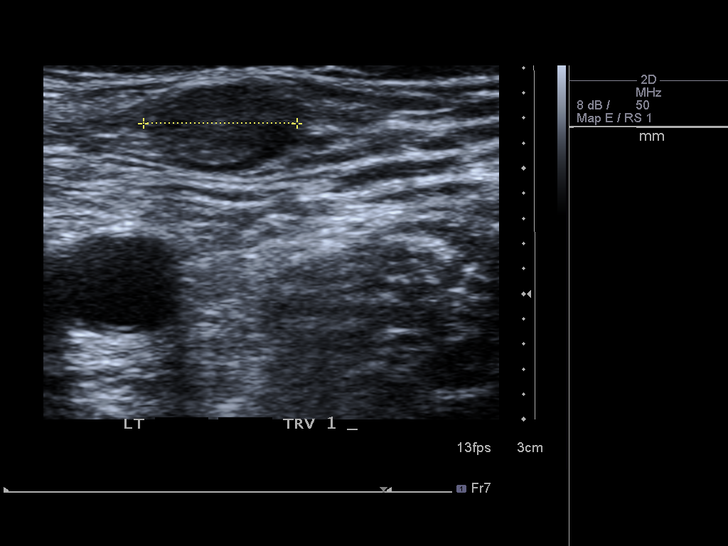
[im 29/29]
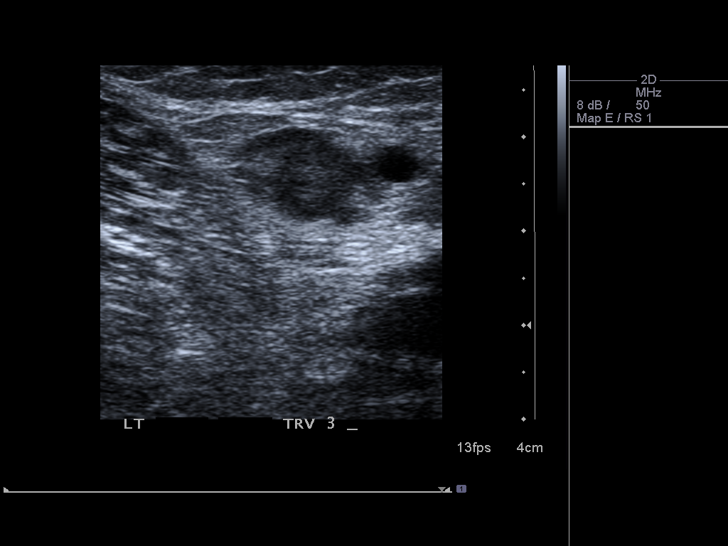

[14 of 24 positions shown; findings below may reference images not displayed]

FINDINGS: From the common femoral vein to the proximal calf, the
deep venous system appears normal with normal compression, normal
color flow, normal Doppler wave forms and normal augmentation.
Incidental note is made of some ovoid entities that probably
represent lymph nodes related to inflammation of the foot and
ankle.
IMPRESSION: No deep venous pathology.  Lymph nodes probably related to
inflammation of the foot and ankle.

## 2012-11-26 IMAGING — CR DG ANKLE COMPLETE 3+V*L*
3 series · 3 of 3 positions shown · non-contrast
Comparison: May 2011

CLINICAL DATA: Pain and swelling

LEFT ANKLE COMPLETE - 3+ VIEW

[view not recorded (1 of 3)]
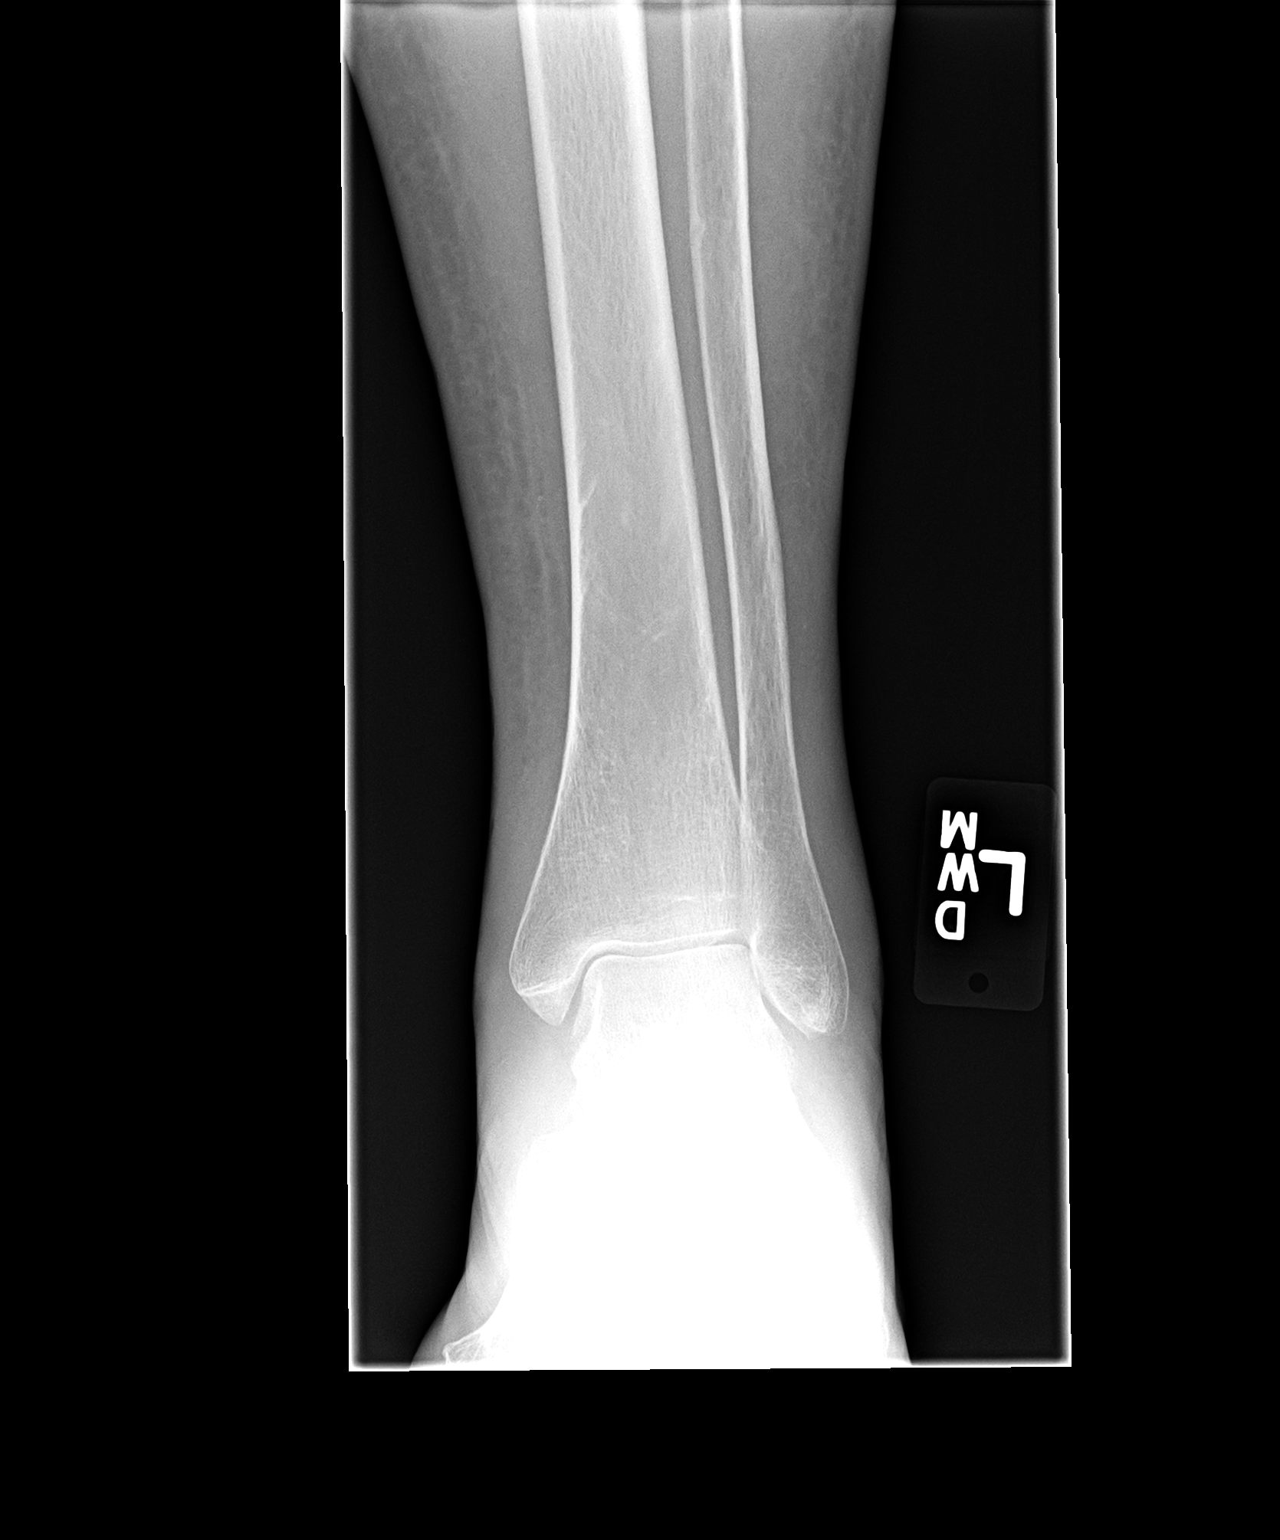

[view not recorded (2 of 3)]
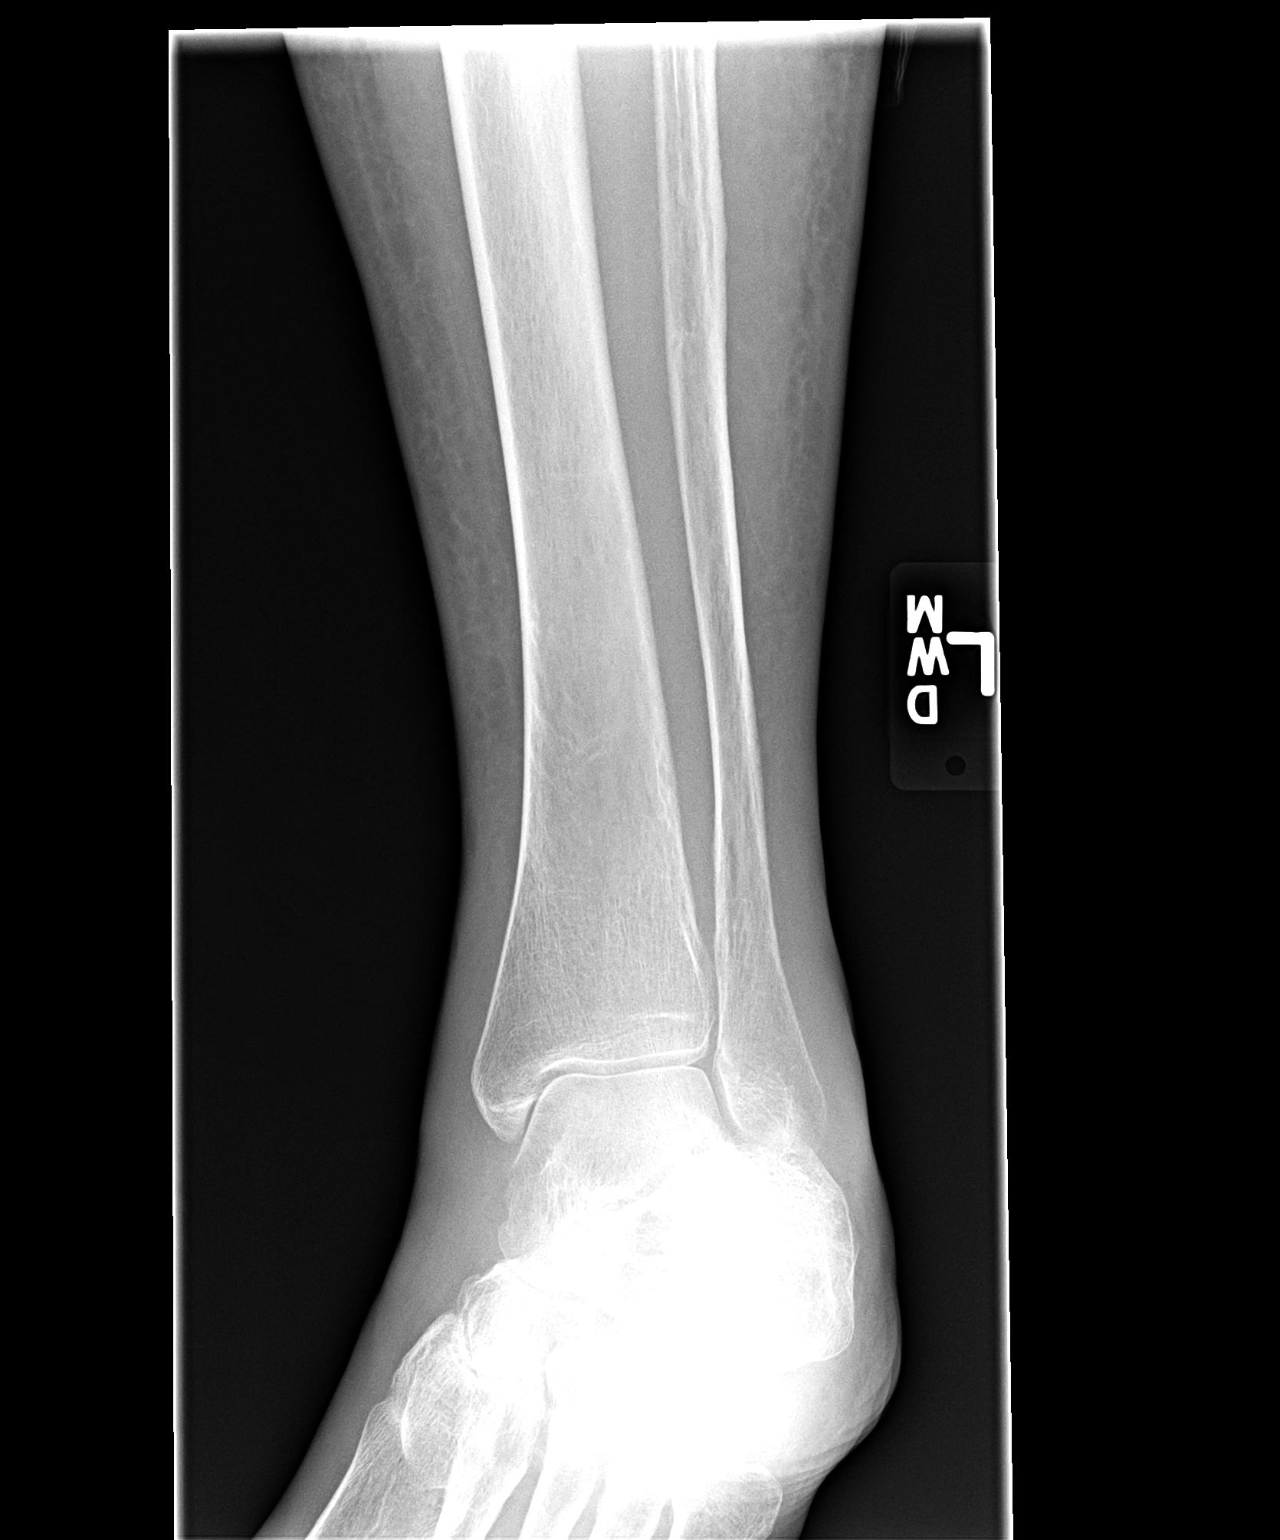

[view not recorded (3 of 3)]
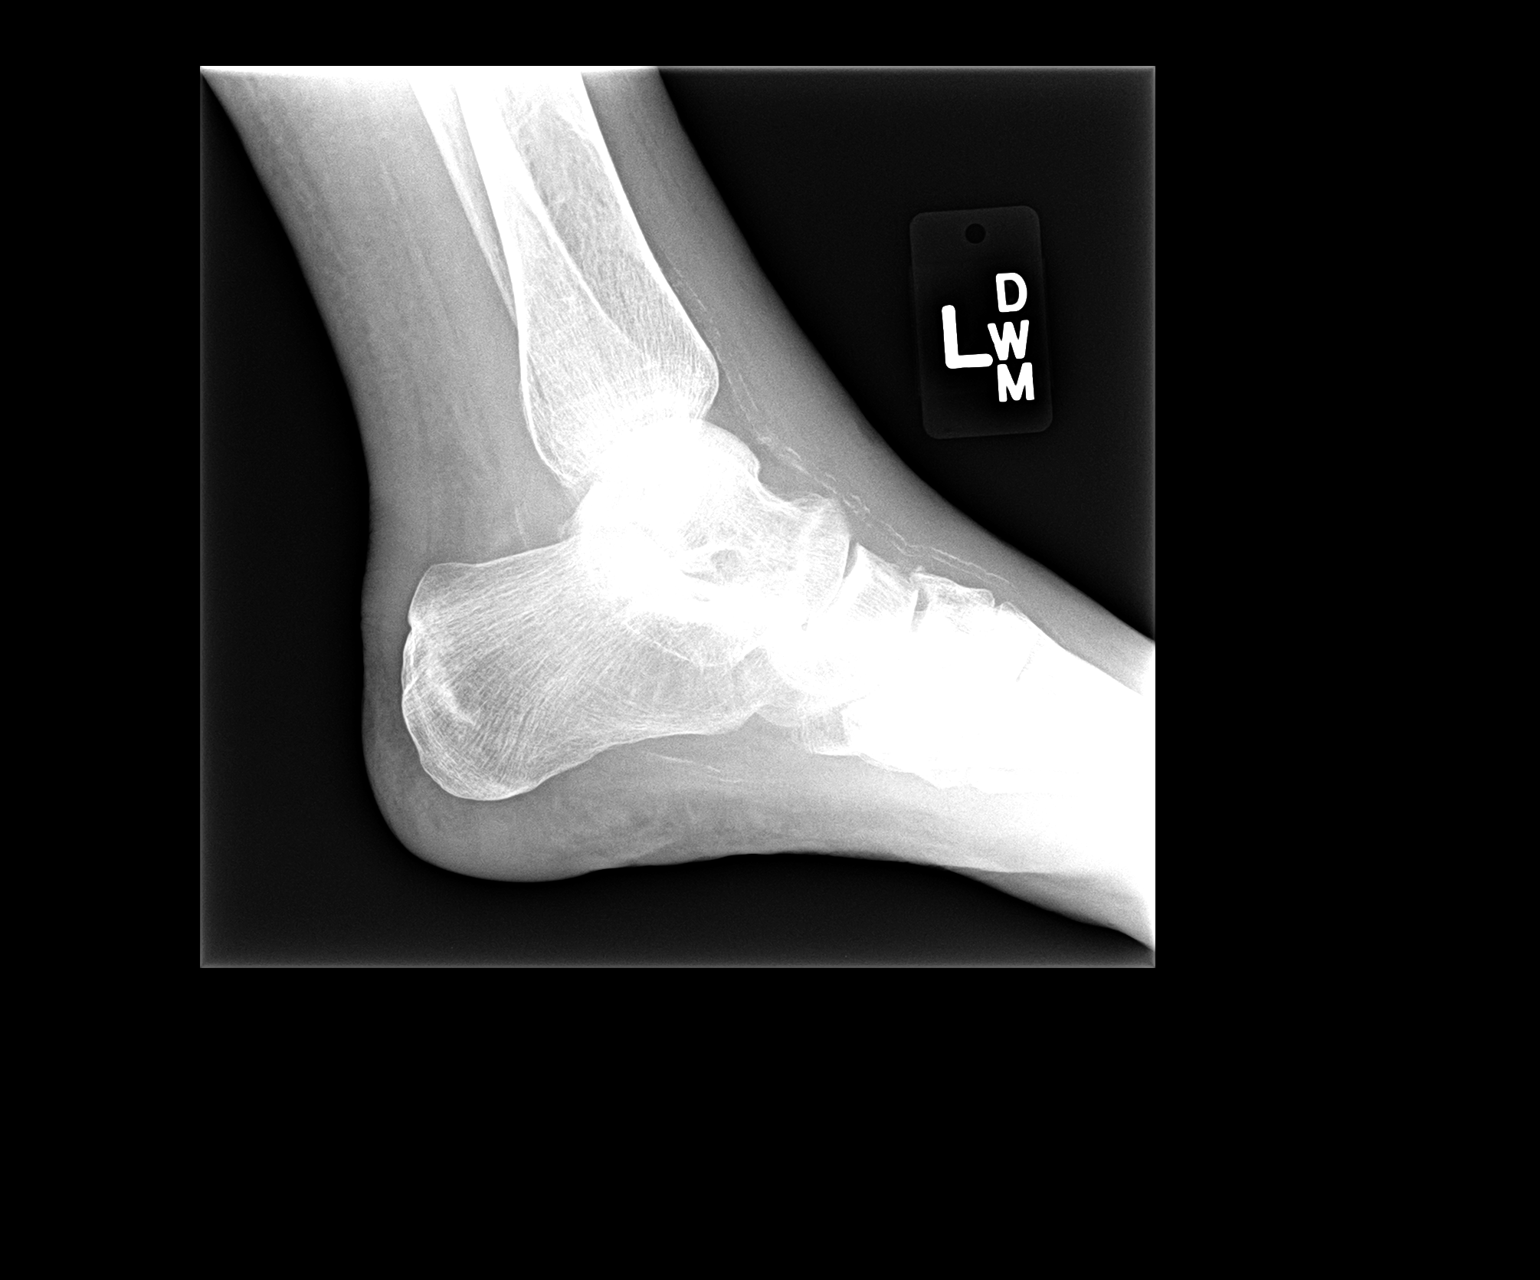

[3 of 3 positions shown; findings below may reference images not displayed]

FINDINGS: No evidence of fracture or degenerative change.  No
evidence of osteomyelitis or other focal lesion.  Vascular
calcification is noted.
IMPRESSION: Negative

## 2012-12-18 ENCOUNTER — Encounter (HOSPITAL_BASED_OUTPATIENT_CLINIC_OR_DEPARTMENT_OTHER): Payer: Medicaid Other | Attending: Internal Medicine

## 2012-12-18 DIAGNOSIS — E1169 Type 2 diabetes mellitus with other specified complication: Secondary | ICD-10-CM | POA: Insufficient documentation

## 2012-12-18 DIAGNOSIS — L97509 Non-pressure chronic ulcer of other part of unspecified foot with unspecified severity: Secondary | ICD-10-CM | POA: Insufficient documentation

## 2012-12-19 IMAGING — CR DG FOOT COMPLETE 3+V*L*
3 series · 3 of 3 positions shown · non-contrast
Comparison: Radiographs dated 07/02/2011 and MRI dated 05/10/2011
and radiographs dated 04/19/2011

CLINICAL DATA: Pain and swelling in the left foot.

LEFT FOOT - COMPLETE 3+ VIEW

[view not recorded (1 of 3)]
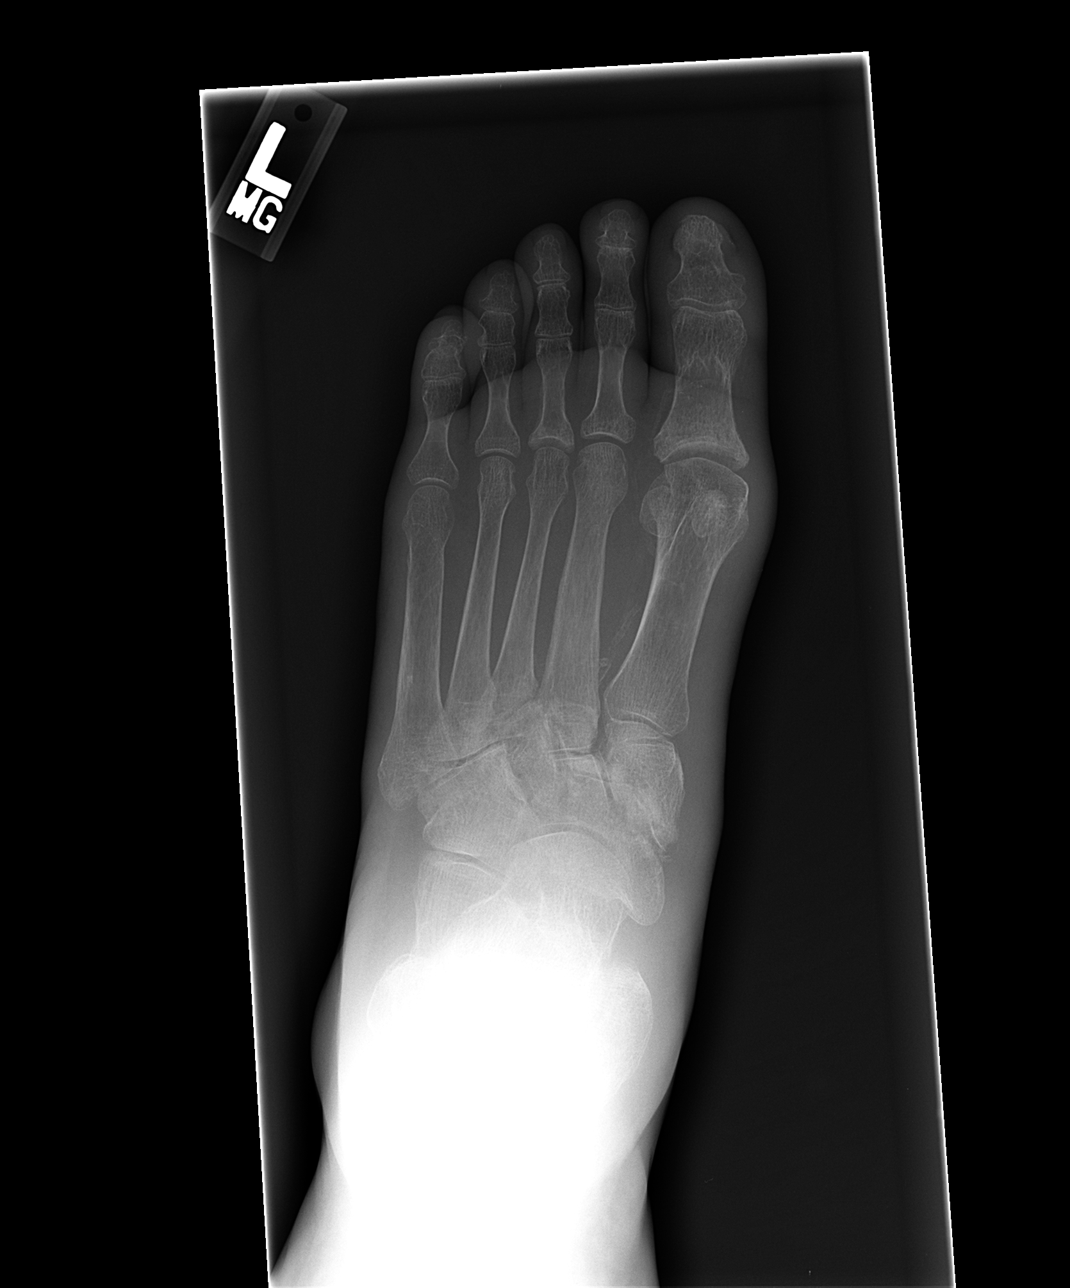

[view not recorded (2 of 3)]
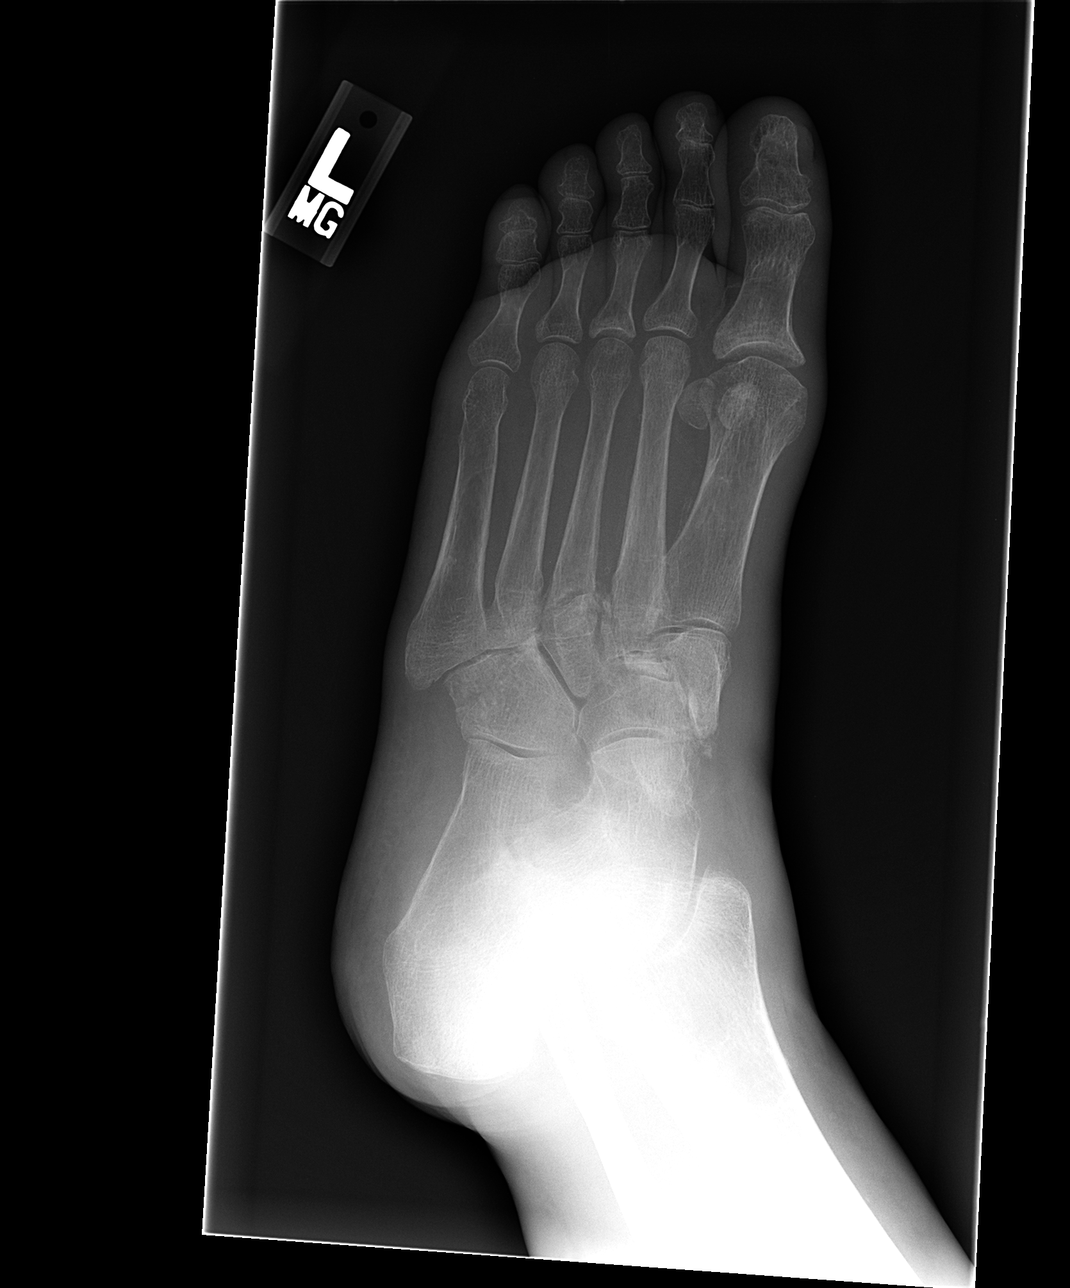

[view not recorded (3 of 3)]
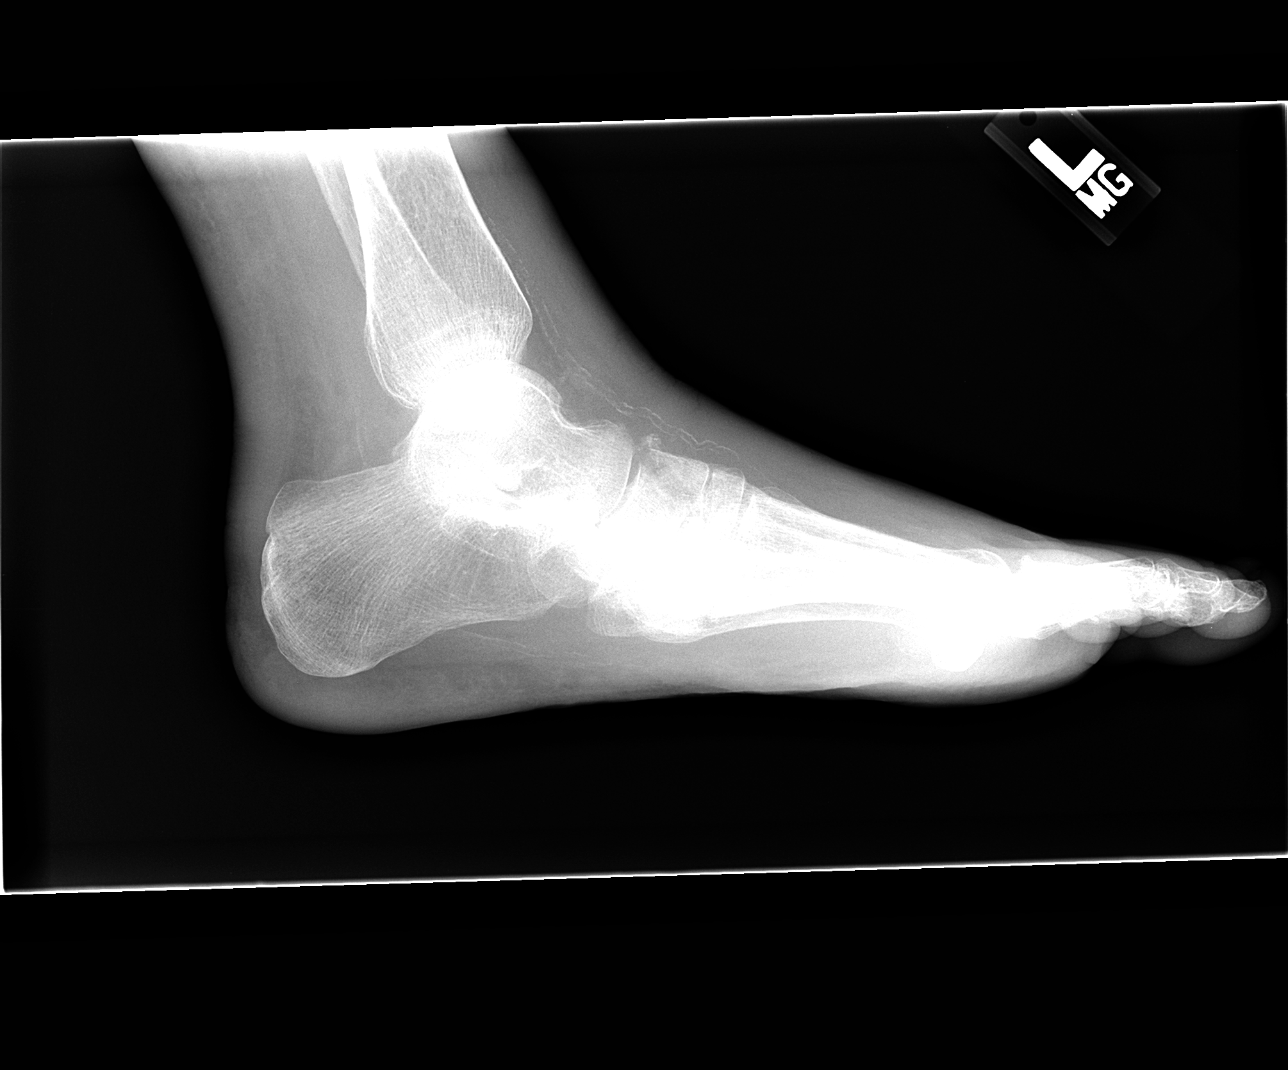

[3 of 3 positions shown; findings below may reference images not displayed]

FINDINGS: Since the prior exam the patient has developed multiple
foot fractures including a complex fracture of the navicular and
fractures of the cuneiforms with at least a subluxation is not
dislocation of the bases of the second and third metacarpals with
probable fractures of those metacarpal bases.  The patient has now
developed a flat foot deformity secondary to the fractures.  There
also appears to be an impacted fracture of the cuboid although this
could be projectional.
IMPRESSION: Numerous fractures of the tarsal bones and possibly of the second
and third metatarsal bases.  Does the patient have a neuropathic
foot?

## 2012-12-24 ENCOUNTER — Ambulatory Visit (INDEPENDENT_AMBULATORY_CARE_PROVIDER_SITE_OTHER): Payer: Medicaid Other | Admitting: Endocrinology

## 2012-12-24 ENCOUNTER — Encounter: Payer: Self-pay | Admitting: Endocrinology

## 2012-12-24 VITALS — BP 130/90 | HR 89 | Temp 98.7°F | Resp 12 | Ht 67.0 in | Wt 126.0 lb

## 2012-12-24 DIAGNOSIS — E1065 Type 1 diabetes mellitus with hyperglycemia: Secondary | ICD-10-CM

## 2012-12-24 DIAGNOSIS — IMO0002 Reserved for concepts with insufficient information to code with codable children: Secondary | ICD-10-CM

## 2012-12-24 DIAGNOSIS — E1049 Type 1 diabetes mellitus with other diabetic neurological complication: Secondary | ICD-10-CM

## 2012-12-24 DIAGNOSIS — E875 Hyperkalemia: Secondary | ICD-10-CM

## 2012-12-24 LAB — HEMOGLOBIN A1C: Hgb A1c MFr Bld: 13.3 % — ABNORMAL HIGH (ref 4.6–6.5)

## 2012-12-24 LAB — COMPREHENSIVE METABOLIC PANEL
Alkaline Phosphatase: 110 U/L (ref 39–117)
BUN: 35 mg/dL — ABNORMAL HIGH (ref 6–23)
CO2: 21 mEq/L (ref 19–32)
Creatinine, Ser: 1.3 mg/dL — ABNORMAL HIGH (ref 0.4–1.2)
GFR: 48.38 mL/min — ABNORMAL LOW (ref >60.00)
Glucose, Bld: 313 mg/dL — ABNORMAL HIGH (ref 70–99)
Total Bilirubin: 0.2 mg/dL — ABNORMAL LOW (ref 0.3–1.2)

## 2012-12-24 NOTE — Progress Notes (Signed)
Patient ID: [name], female   DOB: [birth year], 42 y.o.   MRN: [id number]  [name] is an 42 y.o. female.   Reason for Appointment: Diabetes follow-up   History of Present Illness   Diagnosis: Type 1 DIABETES MELITUS  Previous history: She has had long-standing poorly controlled diabetes and typically has poor compliance with self care measures despite periodic diabetes education and periodic followup  Recent history: She has not been seen in several months and her blood sugar appears to be consistently poor. She is again having issues with not taking her insulin especially her mealtime dose. Also is irregular with the Lantus and did not take it last night She is afraid of hypoglycemia and does not take her mealtime coverage and probably not taking Humalog consistently at all Last night she had no insulin for her evening meal close to bedtime and her blood sugar early this morning was 344 She also did not use her Accu-Chek which can't be downloaded She also thinks that she has been inconsistent because of stress     Insulin regimen: Lantus 13 units a.m., 9 units p.m. Humalog irregularly        Proper timing of medications in relation to meals: Yes.          Monitors blood glucose: Once a day.    Glucometer: One Touch.          Blood Glucose readings from meter download: readings before breakfast: Mostly high recently, lowest reading 164, highest about 400; evening readings Mostly 300+, occasionally 167. Average for the last 4 weeks is 390 Hypoglycemia frequency:  none recently, she had severe hypoglycemia about a month ago        Meals: 1 meals per day.          Physical activity: exercise: Unable to do much           Dietician visit: Most recent: Record not available           Complications: are: Neuropathy, nephropathy, diabetic foot ulcer    The last HbgA1c report is not available  Wt Readings from Last 3 Encounters:  12/24/12 126 lb (57.153 kg)  11/16/12 131 lb  (59.421 kg)  10/26/12 125 lb (56.7 kg)   Lab Results  Component Value Date   HGBA1C 13.3* 12/24/2012       Medication List       This list is accurate as of: 12/24/12  8:41 AM.  Always use your most recent med list.               albuterol 108 (90 BASE) MCG/ACT inhaler  Commonly known as:  PROVENTIL HFA;VENTOLIN HFA  Inhale 2 puffs into the lungs every 6 (six) hours as needed. S.O.B.     amLODipine 5 MG tablet  Commonly known as:  NORVASC  Take 5 mg by mouth daily.     amoxicillin-clavulanate 500-125 MG per tablet  Commonly known as:  AUGMENTIN  Take 1 tablet (500 mg total) by mouth every 8 (eight) hours.     aspirin 325 MG tablet  Take 325 mg by mouth daily.     azithromycin 250 MG tablet  Commonly known as:  ZITHROMAX  Take 1 tablet (250 mg total) by mouth daily. Take first 2 tablets together, then 1 every day until finished.     cephALEXin 500 MG capsule  Commonly known as:  KEFLEX  Take 1 capsule (500 mg total) by mouth 3 (three) times daily.  clindamycin 300 MG capsule  Commonly known as:  CLEOCIN  Take 1 capsule (300 mg total) by mouth 3 (three) times daily.     furosemide 20 MG tablet  Commonly known as:  LASIX  Take 20 mg by mouth daily.     guaiFENesin-codeine 100-10 MG/5ML syrup  Commonly known as:  ROBITUSSIN AC  Take 5 mLs by mouth 3 (three) times daily as needed for cough.     HYDROcodone-acetaminophen 5-325 MG per tablet  Commonly known as:  NORCO/VICODIN  Take 2 tablets by mouth every 6 (six) hours as needed for pain.     insulin glargine 100 UNIT/ML injection  Commonly known as:  LANTUS  Inject 10 Units into the skin 2 (two) times daily.     insulin lispro 100 UNIT/ML injection  Commonly known as:  HUMALOG  Inject 2-7 Units into the skin 3 (three) times daily before meals. Pt on sliding scale     lisinopril 2.5 MG tablet  Commonly known as:  PRINIVIL,ZESTRIL  Take 2.5 mg by mouth daily.     pravastatin 20 MG tablet  Commonly  known as:  PRAVACHOL  Take 20 mg by mouth daily.     sulfamethoxazole-trimethoprim 800-160 MG per tablet  Commonly known as:  SEPTRA DS  Take 1 tablet by mouth every 12 (twelve) hours.     sulfamethoxazole-trimethoprim 800-160 MG per tablet  Commonly known as:  BACTRIM DS,SEPTRA DS  Take 1 tablet by mouth 2 (two) times daily. One po bid x 3 days        Allergies:  Allergies  Allergen Reactions  . Ciprofloxacin Itching  . Cleocin [Clindamycin Hcl]     Diarrhea     Past Medical History  Diagnosis Date  . Diabetes mellitus   . Neuropathy in diabetes   . Hypertension   . Hypercholesteremia   . H/O seasonal allergies   . Anemia   . Left foot infection     Past Surgical History  Procedure Laterality Date  . Cesarean section    . Eye surgery    . Hemrrhoidectomy    . Peripherally inserted central catheter insertion      No family history on file.  Social History:  reports that she quit smoking about 4 years ago. She does not have any smokeless tobacco history on file. She reports that she drinks about 0.5 ounces of alcohol per week. She reports that she does not use illicit drugs.  Review of Systems:  Hypertension:  she previously has been on amlodipine, currently taking only low-dose lisinopril and has no lightheadedness or headaches  Lipids: Has not been taking her pravastatin recently      Examination:   BP 130/90  Pulse 89  Temp(Src) 98.7 F (37.1 C)  Resp 12  Ht 5\' 7"  (1.702 m)  Wt 126 lb (57.153 kg)  BMI 19.73 kg/m2  SpO2 97%  LMP 12/19/2012  Body mass index is 19.73 kg/(m^2).   Details in foot exam section  No pedal edema  ASSESSMENT/ PLAN::   Diabetes type 1 with poor control and multiple complications    Blood glucose is mostly high from noncompliance with her insulin She was given written instructions for how to take her insulin and needs to take her Lantus consistently,  13 units in the morning and 9 units about 12 hours later She will  take mealtime coverage with carbohydrate counting right at her mealtime and not wait till the blood sugar goes up.  Use 1  unit per 15 g an extra one unit for every 50 mg over 200  She will not take any Humalog at bedtime unless blood sugar over 300 Discussed keeping a detailed diary of  insulin doses, food and carbohydrate intake and blood sugar readings for one weekand reviewing this with nurse educator so that she has better idea of how to comply.  Discussed that she will not be able to use an insulin pump unless she shows compliance with doing her insulin as directed especially mealtime doses  Hyperlipidemia: She needs to resume pravastatin and have repeat levels done on next visit. She will also discuss with PCP  History of hypertension: Blood pressure is fairly good today and she can continue low-dose lisinopril  Counseling time over 50% of today's 25 minute visit . [name] 12/24/2012, 8:41 AM   Office Visit on 12/24/2012  Component Date Value Range Status  . Sodium 12/24/2012 133* 135 - 145 mEq/L Final  . Potassium 12/24/2012 5.4* 3.5 - 5.1 mEq/L Final  . Chloride 12/24/2012 108  96 - 112 mEq/L Final  . CO2 12/24/2012 21  19 - 32 mEq/L Final  . Glucose, Bld 12/24/2012 313* 70 - 99 mg/dL Final  . BUN 12/24/2012 35* 6 - 23 mg/dL Final  . Creatinine, Ser 12/24/2012 1.3* 0.4 - 1.2 mg/dL Final  . Total Bilirubin 12/24/2012 0.2* 0.3 - 1.2 mg/dL Final  . Alkaline Phosphatase 12/24/2012 110  39 - 117 U/L Final  . AST 12/24/2012 18  0 - 37 U/L Final  . ALT 12/24/2012 25  0 - 35 U/L Final  . Total Protein 12/24/2012 6.9  6.0 - 8.3 g/dL Final  . Albumin 12/24/2012 2.9* 3.5 - 5.2 g/dL Final  . Calcium 12/24/2012 8.4  8.4 - 10.5 mg/dL Final  . GFR 12/24/2012 48.38* >60.00 mL/min Final  . Hemoglobin A1C 12/24/2012 13.3* 4.6 - 6.5 % Final   Glycemic Control Guidelines for People with Diabetes:Non Diabetic:  <6%Goal of Therapy: <7%Additional Action Suggested:  >8%

## 2012-12-24 NOTE — Patient Instructions (Addendum)
Check blood sugar 3 times a day especially before meals and at least once in between meals or bedtime  Take Lantus consistently at the same time everyday, 13 units in the morning and 9 units about 12 hours later  Take Humalog for every meal covering the carbohydrates and 1 unit per 15 g an extra one unit for every 50 mg over 200 Do not take any Humalog at bedtime unless blood sugar over 300  May sure if you take the Humalog right after eating Keep a record of on insulin doses, food and carbohydrate intake and blood sugar readings for one week before going to nurse educator

## 2012-12-29 ENCOUNTER — Encounter: Payer: Medicaid Other | Attending: Endocrinology | Admitting: Nutrition

## 2012-12-29 DIAGNOSIS — Z713 Dietary counseling and surveillance: Secondary | ICD-10-CM | POA: Insufficient documentation

## 2012-12-29 DIAGNOSIS — E109 Type 1 diabetes mellitus without complications: Secondary | ICD-10-CM | POA: Insufficient documentation

## 2012-12-29 DIAGNOSIS — IMO0002 Reserved for concepts with insufficient information to code with codable children: Secondary | ICD-10-CM

## 2012-12-29 DIAGNOSIS — E1065 Type 1 diabetes mellitus with hyperglycemia: Secondary | ICD-10-CM

## 2012-12-30 ENCOUNTER — Encounter: Payer: Self-pay | Admitting: Nutrition

## 2012-12-30 NOTE — Progress Notes (Signed)
This patient reports that she is here to order an insulin pump.  She wants the OmniPod, but this one is not covered under her insurance.  She was shown the other pumps and we talked about the advantages/disadvantages of insulin pump therapy, as well as each pump's special features.   She decided on the Medtronic because of the CGM, and it was stressed that she still needs to test her blood sugars ac and HS.  She reported good understanding of this.   She brought in a copy of her blood sugar readings over the last week.  The FBSs 47-489,  AcL: 67-352,  acS:179-450,  HS:  77-360.  Pt. Does not eat 3 meals and does not always take her insulin ac meals, because she is afraid that she will not be able to eat all of the food.  She does not adjust the Humalog dose for varying meal sizes, but does take more Humalog when blood sugars are high.  She has no calculation for this dose, she "just takes a little more".  She is also stacking insulin, by not waiting 4 hours before giving more insulin.   I explained the idea of stacking insulin, and she agreed that she is probably doing this.  She did not admit that she was not taking her Humalog before meals, and her dietary log, shows that she had taken insulin before all meals, though no times were listed when the insulin was taken. She is also overtreating her low blood sugars by as much as 100 grams of carbohydrate.  We discussed the idea that 15 grams of carbohydrate will raise her blood sugar 60 points.  We als discussed the fact that if she has some IOB, she will need 15 grams more for every one unit of insulin on board.  She did not believe this, but promised to wait for 15 min. Before retreating the lows.

## 2012-12-30 NOTE — Progress Notes (Deleted)
Subjective:     Patient ID: [name], female   DOB: October 06, 1970, 42 y.o.   MRN: [id number]  HPI   Review of Systems     Objective:   Physical Exam     Assessment:     ***    Plan:     ***

## 2012-12-30 NOTE — Patient Instructions (Addendum)
Wait 4 hours before taking more insulin, when blood sugars are high.   Test blood sugars ac and HS. Treat low blood sugars with 15-30 grams of carbohydrate and wait 15 min. Before retreating.

## 2013-01-01 ENCOUNTER — Encounter (HOSPITAL_BASED_OUTPATIENT_CLINIC_OR_DEPARTMENT_OTHER): Payer: Medicaid Other | Attending: Internal Medicine

## 2013-01-01 DIAGNOSIS — E1169 Type 2 diabetes mellitus with other specified complication: Secondary | ICD-10-CM | POA: Insufficient documentation

## 2013-01-01 DIAGNOSIS — L97509 Non-pressure chronic ulcer of other part of unspecified foot with unspecified severity: Secondary | ICD-10-CM | POA: Insufficient documentation

## 2013-02-04 ENCOUNTER — Encounter: Payer: Self-pay | Admitting: Endocrinology

## 2013-02-04 ENCOUNTER — Encounter (INDEPENDENT_AMBULATORY_CARE_PROVIDER_SITE_OTHER): Payer: Medicaid Other | Admitting: Endocrinology

## 2013-02-04 ENCOUNTER — Other Ambulatory Visit (INDEPENDENT_AMBULATORY_CARE_PROVIDER_SITE_OTHER): Payer: Medicaid Other

## 2013-02-04 DIAGNOSIS — IMO0002 Reserved for concepts with insufficient information to code with codable children: Secondary | ICD-10-CM

## 2013-02-04 DIAGNOSIS — E1065 Type 1 diabetes mellitus with hyperglycemia: Secondary | ICD-10-CM

## 2013-02-04 LAB — BASIC METABOLIC PANEL
CO2: 21 mEq/L (ref 19–32)
Chloride: 105 mEq/L (ref 96–112)
Sodium: 132 mEq/L — ABNORMAL LOW (ref 135–145)

## 2013-02-04 LAB — FRUCTOSAMINE: Fructosamine: 426 umol/L — ABNORMAL HIGH (ref <285)

## 2013-02-05 ENCOUNTER — Encounter (HOSPITAL_BASED_OUTPATIENT_CLINIC_OR_DEPARTMENT_OTHER): Payer: Medicaid Other | Attending: Internal Medicine

## 2013-02-05 DIAGNOSIS — L97509 Non-pressure chronic ulcer of other part of unspecified foot with unspecified severity: Secondary | ICD-10-CM | POA: Insufficient documentation

## 2013-02-05 DIAGNOSIS — E1169 Type 2 diabetes mellitus with other specified complication: Secondary | ICD-10-CM | POA: Insufficient documentation

## 2013-02-06 ENCOUNTER — Ambulatory Visit (INDEPENDENT_AMBULATORY_CARE_PROVIDER_SITE_OTHER): Payer: Medicaid Other | Admitting: Endocrinology

## 2013-02-06 ENCOUNTER — Encounter: Payer: Self-pay | Admitting: Endocrinology

## 2013-02-06 VITALS — BP 156/88 | HR 83 | Temp 98.4°F | Resp 10 | Ht 67.0 in | Wt 132.4 lb

## 2013-02-06 DIAGNOSIS — IMO0002 Reserved for concepts with insufficient information to code with codable children: Secondary | ICD-10-CM

## 2013-02-06 DIAGNOSIS — E7800 Pure hypercholesterolemia, unspecified: Secondary | ICD-10-CM

## 2013-02-06 DIAGNOSIS — I10 Essential (primary) hypertension: Secondary | ICD-10-CM

## 2013-02-06 DIAGNOSIS — E1065 Type 1 diabetes mellitus with hyperglycemia: Secondary | ICD-10-CM

## 2013-02-06 DIAGNOSIS — E1029 Type 1 diabetes mellitus with other diabetic kidney complication: Secondary | ICD-10-CM

## 2013-02-06 LAB — GLUCOSE, CAPILLARY: Glucose-Capillary: 250 mg/dL — ABNORMAL HIGH (ref 70–99)

## 2013-02-06 MED ORDER — LOSARTAN POTASSIUM 25 MG PO TABS: 25.0000 mg | ORAL_TABLET | Freq: Every day | ORAL | Status: DC

## 2013-02-06 MED ORDER — PRAVASTATIN SODIUM 20 MG PO TABS: 20.0000 mg | ORAL_TABLET | Freq: Every day | ORAL | Status: DC

## 2013-02-06 NOTE — Patient Instructions (Signed)
LANTUS 16 IN AND 5 AT NIGHT  TAKE HUMALOG WITH YOU FOR THE MAIN MEAL AND SNACKS

## 2013-02-06 NOTE — Progress Notes (Signed)
Patient ID: [name], female   DOB: February 19, 1971, 42 y.o.   MRN: [id number]  [name] is an 42 y.o. female.   Reason for Appointment: Diabetes follow-up   History of Present Illness   Diagnosis: Type 1 DIABETES MELITUS  Previous history: She has had long-standing poorly controlled diabetes and typically has poor compliance with self care measures despite periodic diabetes education and periodic followup  Insulin regimen: Lantus 13 units a.m., 9 units p.m irregularly, Humalog 1:15g and 1:100 correction irregularly        Recent history:  She is again having significant hyperglycemia from not taking her insulin as directed especially her mealtime dose. She has not changed her day-to-day regimen even with detailed discussion with nurse educator. She had kept a record of her food eaten Blood sugars are high throughout the day and generally higher in the afternoons and evenings She is usually just taking her morning Lantus and haphazardly taking Humalog mostly when blood sugars are significantly high Problems identified:    she is irregular with the Lantus in the evening for fear of hypoglycemia  She thinks that if she takes Humalog and Lantus together in the evening she will get severely hypoglycemic  She does not take her Humalog with her when she is eating out  She is eating out most of the time and usually high fat meals  She will snack and have drinks with sugar without any Humalog coverage  She is still taking Humalog mostly when the blood sugar is very high late in the evening which will improve her sugars partially late at night   She is also overtreating her low blood sugars by as much as 100 grams of carbohydrate  She is also stacking her Humalog sometimes when the blood sugars are persistently high, by not waiting 4 hours before giving more insulin.   She again thinks that she has been inconsistent with her insulin regimen because of stress    Proper timing  of medications in relation to meals:   No       Monitors blood glucose: Once a day.    Glucometer:  Accu-Chek [employer]     Blood Glucose readings from meter download: readings early morning 265-503 Midday 106-545, afternoon and evening 198-over 600 Overnight 91-600+  Average for the last 2 weeks is 416, previously 390 Hypoglycemia frequency:  none recently, she had severe hypoglycemia about 3 months ago   Meals: 1 meal per day, extremely variable times and also having snacks.          Physical activity: exercise: Unable to do much           Dietician visit: Most recent: Record not available           Complications: are: Neuropathy, nephropathy, diabetic foot ulcer      Wt Readings from Last 3 Encounters:  02/06/13 132 lb 6.4 oz (60.056 kg)  02/04/13 130 lb (58.968 kg)  12/24/12 126 lb (57.153 kg)   Lab Results  Component Value Date   HGBA1C 13.3* 12/24/2012   HGBA1C 11.9* 05/09/2011   HGBA1C 13.4* 11/16/2010   Lab Results  Component Value Date   MICROALBUR 41.02* 12/30/2009   LDLCALC 105* 11/16/2010   CREATININE 1.1 02/04/2013        Medication List       This list is accurate as of: 02/06/13 11:59 PM.  Always use your most recent med list.  albuterol 108 (90 BASE) MCG/ACT inhaler  Commonly known as:  PROVENTIL HFA;VENTOLIN HFA  Inhale 2 puffs into the lungs every 6 (six) hours as needed. S.O.B.     amoxicillin-clavulanate 500-125 MG per tablet  Commonly known as:  AUGMENTIN  Take 1 tablet (500 mg total) by mouth every 8 (eight) hours.     aspirin 325 MG tablet  Take 325 mg by mouth daily.     azithromycin 250 MG tablet  Commonly known as:  ZITHROMAX  Take 1 tablet (250 mg total) by mouth daily. Take first 2 tablets together, then 1 every day until finished.     guaiFENesin-codeine 100-10 MG/5ML syrup  Commonly known as:  ROBITUSSIN AC  Take 5 mLs by mouth 3 (three) times daily as needed for cough.     HYDROcodone-acetaminophen 5-325 MG per tablet   Commonly known as:  NORCO/VICODIN  Take 2 tablets by mouth every 6 (six) hours as needed for pain.     insulin glargine 100 UNIT/ML injection  Commonly known as:  LANTUS  Inject 22 Units into the skin 2 (two) times daily. Takes 13 units in am and 9 units at night     insulin lispro 100 UNIT/ML injection  Commonly known as:  HUMALOG  Inject 2-7 Units into the skin 3 (three) times daily before meals. Pt on sliding scale     losartan 25 MG tablet  Commonly known as:  COZAAR  Take 1 tablet (25 mg total) by mouth daily.     pravastatin 20 MG tablet  Commonly known as:  PRAVACHOL  Take 1 tablet (20 mg total) by mouth daily.        Allergies:  Allergies  Allergen Reactions  . Ciprofloxacin Itching  . Cleocin [Clindamycin Hcl]     Diarrhea     Past Medical History  Diagnosis Date  . Diabetes mellitus   . Neuropathy in diabetes   . Hypertension   . Hypercholesteremia   . H/O seasonal allergies   . Anemia   . Left foot infection     Past Surgical History  Procedure Laterality Date  . Cesarean section    . Eye surgery    . Hemrrhoidectomy    . Peripherally inserted central catheter insertion      No family history on file.  Social History:  reports that she quit smoking about 4 years ago. She does not have any smokeless tobacco history on file. She reports that she drinks about 0.5 ounces of alcohol per week. She reports that she does not use illicit drugs.  Review of Systems:  Hypertension:  she previously has been on amlodipine. Blood pressure is higher than usual today She was on low-dose lisinopril, possibly had hyperkalemia with this and is not on any medications now  Lipids: Has not been taking her pravastatin recently for no obvious reason      Examination:   BP 156/88  Pulse 83  Temp(Src) 98.4 F (36.9 C)  Resp 10  Ht 5\' 7"  (1.702 m)  Wt 132 lb 6.4 oz (60.056 kg)  BMI 20.73 kg/m2  SpO2 99%  LMP 01/18/2013  Body mass index is 20.73 kg/(m^2).   145/75 standing  No pedal edema  ASSESSMENT/ PLAN::   Diabetes type 1 with poor control and multiple complications    Blood glucose is again significantly high from noncompliance with her insulin Her mean blood sugar at home is 416 and despite taking the blood sugar at least twice a day she  is not getting enough insulin coverage Her A1c has been markedly increased and fructosamine also indicates poor control She will not take her Lantus in the evening as directed she as discussed in history of present illness for fear of hypoglycemia Also is continuing to be noncompliant with mealtime coverage despite instructions from nurse educator She does not take her insulin with her and is always eating out Also her meals are not balanced and periodically will have high carbohydrate snacks which cause further hyperglycemia Most likely blood sugars are worse when she drinks high sugar beverages because of her thirst She will have occasional improved readings when she takes extra insulin for very high readings  PLAN: To prevent potential hypoglycemia she can reduce her evening dose of Lantus and still take some to help cover her overnight hyperglycemia She was given written instructions for how to take her insulin and needs to take her Lantus consistently, 15 units in the morning and 5 units about 12 hours later She will take mealtime coverage with carbohydrate counting right at her mealtime and not wait till the blood sugar goes up.  Use 1 unit per 15 g an extra one unit for every 50 mg over 200  Discussed that she will not be able to benefit from an insulin pump unless she shows compliance with doing her insulin as directed especially mealtime doses However she thinks she will be better compliant with mealtime boluses if she has her pump on her when she is eating out Will give her a trial of the pump when she gets it  Hyperlipidemia: She needs to resume pravastatin and have repeat levels done on  next visit.   HYPERTENSION with diabetic nephropathy: Blood pressure is fairly high today and she can start losartan low dose for now, will monitor for change in renal function or potassium  Counseling time over 50% of today's 25 minute visit . [name] 02/09/2013, 10:39 AM   Office Visit on 02/05/2013  Component Date Value Range Status  . Glucose-Capillary 02/06/2013 250* 70 - 99 mg/dL Final  Appointment on 02/04/2013  Component Date Value Range Status  . Sodium 02/04/2013 132* 135 - 145 mEq/L Final  . Potassium 02/04/2013 4.8  3.5 - 5.1 mEq/L Final  . Chloride 02/04/2013 105  96 - 112 mEq/L Final  . CO2 02/04/2013 21  19 - 32 mEq/L Final  . Glucose, Bld 02/04/2013 272* 70 - 99 mg/dL Final  . BUN 02/04/2013 32* 6 - 23 mg/dL Final  . Creatinine, Ser 02/04/2013 1.1  0.4 - 1.2 mg/dL Final  . Calcium 02/04/2013 8.5  8.4 - 10.5 mg/dL Final  . GFR 02/04/2013 59.36* >60.00 mL/min Final  . Fructosamine 02/04/2013 426* <285 umol/L Final   Comment:                            Variations in levels of serum proteins (albumin and immunoglobulins)                          may affect fructosamine results.

## 2013-02-08 ENCOUNTER — Emergency Department (HOSPITAL_BASED_OUTPATIENT_CLINIC_OR_DEPARTMENT_OTHER)
Admission: EM | Admit: 2013-02-08 | Discharge: 2013-02-09 | Disposition: A | Discharge status: Home / Self Care | Payer: Medicaid Other | Attending: Emergency Medicine | Admitting: Emergency Medicine

## 2013-02-08 ENCOUNTER — Encounter (HOSPITAL_BASED_OUTPATIENT_CLINIC_OR_DEPARTMENT_OTHER): Payer: Self-pay | Admitting: Emergency Medicine

## 2013-02-08 DIAGNOSIS — Z794 Long term (current) use of insulin: Secondary | ICD-10-CM | POA: Insufficient documentation

## 2013-02-08 DIAGNOSIS — R238 Other skin changes: Secondary | ICD-10-CM

## 2013-02-08 DIAGNOSIS — I10 Essential (primary) hypertension: Secondary | ICD-10-CM | POA: Insufficient documentation

## 2013-02-08 DIAGNOSIS — Z87891 Personal history of nicotine dependence: Secondary | ICD-10-CM | POA: Insufficient documentation

## 2013-02-08 DIAGNOSIS — E1142 Type 2 diabetes mellitus with diabetic polyneuropathy: Secondary | ICD-10-CM | POA: Insufficient documentation

## 2013-02-08 DIAGNOSIS — L988 Other specified disorders of the skin and subcutaneous tissue: Secondary | ICD-10-CM | POA: Insufficient documentation

## 2013-02-08 DIAGNOSIS — Y939 Activity, unspecified: Secondary | ICD-10-CM | POA: Insufficient documentation

## 2013-02-08 DIAGNOSIS — Z862 Personal history of diseases of the blood and blood-forming organs and certain disorders involving the immune mechanism: Secondary | ICD-10-CM | POA: Insufficient documentation

## 2013-02-08 DIAGNOSIS — Y929 Unspecified place or not applicable: Secondary | ICD-10-CM | POA: Insufficient documentation

## 2013-02-08 DIAGNOSIS — Z79899 Other long term (current) drug therapy: Secondary | ICD-10-CM | POA: Insufficient documentation

## 2013-02-08 DIAGNOSIS — Z7982 Long term (current) use of aspirin: Secondary | ICD-10-CM | POA: Insufficient documentation

## 2013-02-08 DIAGNOSIS — E1149 Type 2 diabetes mellitus with other diabetic neurological complication: Secondary | ICD-10-CM | POA: Insufficient documentation

## 2013-02-08 DIAGNOSIS — T25239A Burn of second degree of unspecified toe(s) (nail), initial encounter: Secondary | ICD-10-CM | POA: Insufficient documentation

## 2013-02-08 DIAGNOSIS — E7800 Pure hypercholesterolemia, unspecified: Secondary | ICD-10-CM | POA: Insufficient documentation

## 2013-02-08 DIAGNOSIS — X58XXXA Exposure to other specified factors, initial encounter: Secondary | ICD-10-CM | POA: Insufficient documentation

## 2013-02-08 NOTE — ED Provider Notes (Signed)
CSN: [id number]     Arrival date & time 02/08/13  2317 History  This chart was scribed for Julianne Rice, MD by Zettie Pho, ED Scribe. This patient was seen in room MH12/MH12 and the patient's care was started at 11:57 PM.    Chief Complaint  Patient presents with  . Foot Pain   Patient is a 42 y.o. female presenting with lower extremity pain. The history is provided by the patient. No language interpreter was used.  Foot Pain This is a new problem. The current episode started 1 to 2 hours ago. The problem occurs constantly. The problem has not changed since onset.  HPI Comments: [name] is a 42 y.o. female with a history of diabetic neuropathy who presents to the Emergency Department complaining of blisters to the left toes onset about an hour ago. She reports associated numbness to the area secondary to diabetic neuropathy and denies any recent changes. She reports that she may have spilled hot oil on the foot while at work and the blisters appear to be consistent with a burn. She states that she has an appointment with wound care in 4 days. Patient reports allergies to ciprofloxacin and cleocin. Patient reports a history of DM, HTN, and hypercholesterolemia.   Past Medical History  Diagnosis Date  . Diabetes mellitus   . Neuropathy in diabetes   . Hypertension   . Hypercholesteremia   . H/O seasonal allergies   . Anemia   . Left foot infection    Past Surgical History  Procedure Laterality Date  . Cesarean section    . Eye surgery    . Hemrrhoidectomy    . Peripherally inserted central catheter insertion     History reviewed. No pertinent family history. History  Substance Use Topics  . Smoking status: Former Smoker    Quit date: 05/16/2008  . Smokeless tobacco: Not on file  . Alcohol Use: 0.5 oz/week    1 drink(s) per week     Comment: Occasional   OB History   Grav Para Term Preterm Abortions TAB SAB Ect Mult Living                 Review of Systems   Skin: Positive for wound.  Neurological: Positive for numbness.  All other systems reviewed and are negative.    Allergies  Ciprofloxacin and Cleocin  Home Medications   Current Outpatient Rx  Name  Route  Sig  Dispense  Refill  . albuterol (PROVENTIL HFA;VENTOLIN HFA) 108 (90 BASE) MCG/ACT inhaler   Inhalation   Inhale 2 puffs into the lungs every 6 (six) hours as needed. S.O.B.         . amoxicillin-clavulanate (AUGMENTIN) 500-125 MG per tablet   Oral   Take 1 tablet (500 mg total) by mouth every 8 (eight) hours.   30 tablet   0   . aspirin 325 MG tablet   Oral   Take 325 mg by mouth daily.         Marland Kitchen azithromycin (ZITHROMAX) 250 MG tablet   Oral   Take 1 tablet (250 mg total) by mouth daily. Take first 2 tablets together, then 1 every day until finished.   6 tablet   0   . guaiFENesin-codeine (ROBITUSSIN AC) 100-10 MG/5ML syrup   Oral   Take 5 mLs by mouth 3 (three) times daily as needed for cough.   120 mL   0   . HYDROcodone-acetaminophen (NORCO/VICODIN) 5-325 MG per tablet  Oral   Take 2 tablets by mouth every 6 (six) hours as needed for pain.   6 tablet   0   . insulin glargine (LANTUS) 100 UNIT/ML injection   Subcutaneous   Inject 22 Units into the skin 2 (two) times daily. Takes 13 units in am and 9 units at night         . insulin lispro (HUMALOG) 100 UNIT/ML injection   Subcutaneous   Inject 2-7 Units into the skin 3 (three) times daily before meals. Pt on sliding scale         . losartan (COZAAR) 25 MG tablet   Oral   Take 1 tablet (25 mg total) by mouth daily.   30 tablet   3   . pravastatin (PRAVACHOL) 20 MG tablet   Oral   Take 1 tablet (20 mg total) by mouth daily.   30 tablet   3    Triage Vitals: BP 166/78  Pulse 90  Temp(Src) 98 F (36.7 C)  Resp 16  SpO2 100%  LMP 01/18/2013  Physical Exam  Nursing note and vitals reviewed. Constitutional: She is oriented to person, place, and time. She appears well-developed  and well-nourished. No distress.  HENT:  Head: Normocephalic and atraumatic.  Eyes: Conjunctivae are normal.  Neck: Normal range of motion. Neck supple.  Pulmonary/Chest: Effort normal. No respiratory distress.  Abdominal: She exhibits no distension.  Musculoskeletal: Normal range of motion.  Neurological: She is alert and oriented to person, place, and time.  Skin: Skin is warm and dry.  Blisters to the 3rd, 4th, and 5th left toes on dorsal surface, just proximal to the nail. Mild surrounding erythema that is not circumferential.   Psychiatric: She has a normal mood and affect. Her behavior is normal.    ED Course  Procedures (including critical care time)  DIAGNOSTIC STUDIES: Oxygen Saturation is 100% on room air, normal by my interpretation.    COORDINATION OF CARE: 11:59 PM- Advised patient to keep the area clean and dry and not to squeeze the blisters. Will discharge patient with antibiotics and Sylvadine to manage symptoms. Advised patient to follow up if there are any new or worsening symptoms, especially signs of infection. Discussed treatment plan with patient at bedside and patient verbalized agreement.     Labs Review Labs Reviewed - No data to display Imaging Review No results found.  EKG Interpretation   None       MDM  I personally performed the services described in this documentation, which was scribed in my presence. The recorded information has been reviewed and is accurate.  Patient with questionable burns to the dorsal surface of her lateral 3 digits of her left foot. Patient has extensive diabetic neuropathy and thinks that she may have dropped some hot oil on the foot without knowing it. The wounds are not circumferential. There is no evidence of any infection. Given the patient's extensive history of infections, diabetes, peripheral neuropathy we will treat conservatively with Silvadene cream and oral antibiotics. Patient is advised to followup with her  primary doctor at the beginning of the week to reassess the wound. She's been advised to return immediately to the emergency department should there be increasing redness, streaking, fevers, chills or for any concerns.    Julianne Rice, MD 02/09/13 [phone]

## 2013-02-08 NOTE — ED Notes (Signed)
Pt reports sudden development of blisters on her toes

## 2013-02-08 NOTE — ED Notes (Signed)
MD at bedside. 

## 2013-02-09 DIAGNOSIS — E1029 Type 1 diabetes mellitus with other diabetic kidney complication: Secondary | ICD-10-CM | POA: Insufficient documentation

## 2013-02-09 MED ORDER — CEPHALEXIN 500 MG PO CAPS: 500.0000 mg | ORAL_CAPSULE | Freq: Two times a day (BID) | ORAL | Status: DC

## 2013-02-09 MED ORDER — SILVER SULFADIAZINE 1 % EX CREA
TOPICAL_CREAM | Freq: Once | CUTANEOUS | Status: AC
  Administered 2013-02-09: 1 via TOPICAL

## 2013-02-09 MED ORDER — SILVER SULFADIAZINE 1 % EX CREA: 1.0000 "application " | TOPICAL_CREAM | Freq: Every day | CUTANEOUS | Status: DC

## 2013-02-09 MED ORDER — SILVER SULFADIAZINE 1 % EX CREA
TOPICAL_CREAM | CUTANEOUS | Status: AC
  Administered 2013-02-09: 1 via TOPICAL
  Filled 2013-02-09: qty 85

## 2013-03-05 ENCOUNTER — Encounter (HOSPITAL_BASED_OUTPATIENT_CLINIC_OR_DEPARTMENT_OTHER): Payer: Medicaid Other | Attending: Internal Medicine

## 2013-03-05 DIAGNOSIS — E1169 Type 2 diabetes mellitus with other specified complication: Secondary | ICD-10-CM | POA: Insufficient documentation

## 2013-03-05 DIAGNOSIS — L97509 Non-pressure chronic ulcer of other part of unspecified foot with unspecified severity: Secondary | ICD-10-CM | POA: Insufficient documentation

## 2013-03-25 ENCOUNTER — Emergency Department (HOSPITAL_BASED_OUTPATIENT_CLINIC_OR_DEPARTMENT_OTHER): Payer: Medicaid Other

## 2013-03-25 ENCOUNTER — Encounter (HOSPITAL_BASED_OUTPATIENT_CLINIC_OR_DEPARTMENT_OTHER): Payer: Self-pay | Admitting: Emergency Medicine

## 2013-03-25 ENCOUNTER — Inpatient Hospital Stay (HOSPITAL_BASED_OUTPATIENT_CLINIC_OR_DEPARTMENT_OTHER)
Admission: EM | Admit: 2013-03-25 | Discharge: 2013-03-27 | DRG: 871 | Disposition: A | Discharge status: Home / Self Care | Payer: Medicaid Other | Attending: Internal Medicine | Admitting: Internal Medicine

## 2013-03-25 DIAGNOSIS — E1065 Type 1 diabetes mellitus with hyperglycemia: Secondary | ICD-10-CM

## 2013-03-25 DIAGNOSIS — E871 Hypo-osmolality and hyponatremia: Secondary | ICD-10-CM | POA: Diagnosis present

## 2013-03-25 DIAGNOSIS — D62 Acute posthemorrhagic anemia: Secondary | ICD-10-CM | POA: Diagnosis present

## 2013-03-25 DIAGNOSIS — E1142 Type 2 diabetes mellitus with diabetic polyneuropathy: Secondary | ICD-10-CM | POA: Diagnosis present

## 2013-03-25 DIAGNOSIS — I10 Essential (primary) hypertension: Secondary | ICD-10-CM | POA: Diagnosis present

## 2013-03-25 DIAGNOSIS — Z299 Encounter for prophylactic measures, unspecified: Secondary | ICD-10-CM

## 2013-03-25 DIAGNOSIS — E1049 Type 1 diabetes mellitus with other diabetic neurological complication: Secondary | ICD-10-CM | POA: Diagnosis present

## 2013-03-25 DIAGNOSIS — E1140 Type 2 diabetes mellitus with diabetic neuropathy, unspecified: Secondary | ICD-10-CM

## 2013-03-25 DIAGNOSIS — R509 Fever, unspecified: Secondary | ICD-10-CM | POA: Diagnosis present

## 2013-03-25 DIAGNOSIS — E7800 Pure hypercholesterolemia, unspecified: Secondary | ICD-10-CM | POA: Diagnosis present

## 2013-03-25 DIAGNOSIS — E872 Acidosis, unspecified: Secondary | ICD-10-CM | POA: Diagnosis present

## 2013-03-25 DIAGNOSIS — E11610 Type 2 diabetes mellitus with diabetic neuropathic arthropathy: Secondary | ICD-10-CM

## 2013-03-25 DIAGNOSIS — J189 Pneumonia, unspecified organism: Secondary | ICD-10-CM | POA: Diagnosis present

## 2013-03-25 DIAGNOSIS — Z794 Long term (current) use of insulin: Secondary | ICD-10-CM

## 2013-03-25 DIAGNOSIS — R609 Edema, unspecified: Secondary | ICD-10-CM | POA: Diagnosis present

## 2013-03-25 DIAGNOSIS — N390 Urinary tract infection, site not specified: Secondary | ICD-10-CM | POA: Diagnosis present

## 2013-03-25 DIAGNOSIS — D509 Iron deficiency anemia, unspecified: Secondary | ICD-10-CM | POA: Diagnosis present

## 2013-03-25 DIAGNOSIS — Z79899 Other long term (current) drug therapy: Secondary | ICD-10-CM

## 2013-03-25 DIAGNOSIS — A419 Sepsis, unspecified organism: Principal | ICD-10-CM | POA: Diagnosis present

## 2013-03-25 DIAGNOSIS — IMO0002 Reserved for concepts with insufficient information to code with codable children: Secondary | ICD-10-CM | POA: Diagnosis present

## 2013-03-25 DIAGNOSIS — D72829 Elevated white blood cell count, unspecified: Secondary | ICD-10-CM | POA: Diagnosis present

## 2013-03-25 DIAGNOSIS — E785 Hyperlipidemia, unspecified: Secondary | ICD-10-CM | POA: Diagnosis present

## 2013-03-25 DIAGNOSIS — Z7982 Long term (current) use of aspirin: Secondary | ICD-10-CM

## 2013-03-25 DIAGNOSIS — IMO0001 Reserved for inherently not codable concepts without codable children: Secondary | ICD-10-CM

## 2013-03-25 DIAGNOSIS — E13621 Other specified diabetes mellitus with foot ulcer: Secondary | ICD-10-CM

## 2013-03-25 DIAGNOSIS — N179 Acute kidney failure, unspecified: Secondary | ICD-10-CM | POA: Diagnosis present

## 2013-03-25 LAB — CBC WITH DIFFERENTIAL/PLATELET
Band Neutrophils: 8 % (ref 0–10)
Basophils Absolute: 0 10*3/uL (ref 0.0–0.1)
Basophils Absolute: 0 10*3/uL (ref 0.0–0.1)
Basophils Relative: 0 % (ref 0–1)
Blasts: 0 %
Eosinophils Absolute: 0.2 10*3/uL (ref 0.0–0.7)
Eosinophils Absolute: 0 10*3/uL (ref 0.0–0.7)
HCT: 19.7 % — ABNORMAL LOW (ref 36.0–46.0)
HCT: 17.1 % — ABNORMAL LOW (ref 36.0–46.0)
Hemoglobin: 5.8 g/dL — CL (ref 12.0–15.0)
Hemoglobin: 5.1 g/dL — CL (ref 12.0–15.0)
Lymphocytes Relative: 13 % (ref 12–46)
MCH: 19.5 pg — ABNORMAL LOW (ref 26.0–34.0)
MCHC: 29.8 g/dL — ABNORMAL LOW (ref 30.0–36.0)
MCV: 66.1 fL — ABNORMAL LOW (ref 78.0–100.0)
Metamyelocytes Relative: 0 %
Monocytes Absolute: 0.3 10*3/uL (ref 0.1–1.0)
Monocytes Absolute: 0.8 10*3/uL (ref 0.1–1.0)
Monocytes Relative: 2 % — ABNORMAL LOW (ref 3–12)
Myelocytes: 0 %
Neutro Abs: 12.9 10*3/uL — ABNORMAL HIGH (ref 1.7–7.7)
Neutrophils Relative %: 82 % — ABNORMAL HIGH (ref 43–77)
RDW: 17.1 % — ABNORMAL HIGH (ref 11.5–15.5)
RDW: 17.3 % — ABNORMAL HIGH (ref 11.5–15.5)
WBC: 16.9 10*3/uL — ABNORMAL HIGH (ref 4.0–10.5)
nRBC: 0 /100 WBC

## 2013-03-25 LAB — POCT I-STAT 3, VENOUS BLOOD GAS (G3P V)
Acid-base deficit: 6 mmol/L — ABNORMAL HIGH (ref 0.0–2.0)
O2 Saturation: 51 %
TCO2: 20 mmol/L (ref 0–100)

## 2013-03-25 LAB — COMPREHENSIVE METABOLIC PANEL
ALT: 40 U/L — ABNORMAL HIGH (ref 0–35)
ALT: 42 U/L — ABNORMAL HIGH (ref 0–35)
AST: 59 U/L — ABNORMAL HIGH (ref 0–37)
Albumin: 2.5 g/dL — ABNORMAL LOW (ref 3.5–5.2)
Albumin: 2 g/dL — ABNORMAL LOW (ref 3.5–5.2)
Alkaline Phosphatase: 236 U/L — ABNORMAL HIGH (ref 39–117)
Alkaline Phosphatase: 217 U/L — ABNORMAL HIGH (ref 39–117)
BUN: 34 mg/dL — ABNORMAL HIGH (ref 6–23)
BUN: 32 mg/dL — ABNORMAL HIGH (ref 6–23)
CO2: 19 mEq/L (ref 19–32)
Calcium: 8.3 mg/dL — ABNORMAL LOW (ref 8.4–10.5)
Chloride: 104 mEq/L (ref 96–112)
Creatinine, Ser: 1.4 mg/dL — ABNORMAL HIGH (ref 0.50–1.10)
Creatinine, Ser: 1.39 mg/dL — ABNORMAL HIGH (ref 0.50–1.10)
GFR calc Af Amer: 54 mL/min — ABNORMAL LOW (ref >90)
GFR calc non Af Amer: 46 mL/min — ABNORMAL LOW (ref >90)
Glucose, Bld: 193 mg/dL — ABNORMAL HIGH (ref 70–99)
Potassium: 5.1 mEq/L (ref 3.5–5.1)
Potassium: 4.8 mEq/L (ref 3.5–5.1)
Sodium: 129 mEq/L — ABNORMAL LOW (ref 135–145)
Total Bilirubin: 0.2 mg/dL — ABNORMAL LOW (ref 0.3–1.2)
Total Protein: 6.5 g/dL (ref 6.0–8.3)

## 2013-03-25 LAB — URINALYSIS, ROUTINE W REFLEX MICROSCOPIC
Bilirubin Urine: NEGATIVE
Ketones, ur: NEGATIVE mg/dL
Nitrite: POSITIVE — AB
Protein, ur: >300 mg/dL — AB
Urobilinogen, UA: 0.2 mg/dL (ref 0.0–1.0)
pH: 6 (ref 5.0–8.0)

## 2013-03-25 LAB — URINE MICROSCOPIC-ADD ON

## 2013-03-25 LAB — PHOSPHORUS: Phosphorus: 3.7 mg/dL (ref 2.3–4.6)

## 2013-03-25 LAB — GLUCOSE, CAPILLARY
Glucose-Capillary: 163 mg/dL — ABNORMAL HIGH (ref 70–99)
Glucose-Capillary: 165 mg/dL — ABNORMAL HIGH (ref 70–99)

## 2013-03-25 LAB — INFLUENZA PANEL BY PCR (TYPE A & B)
H1N1 flu by pcr: NOT DETECTED
Influenza B By PCR: NEGATIVE

## 2013-03-25 LAB — HEMOGLOBIN AND HEMATOCRIT, BLOOD: Hemoglobin: 5.2 g/dL — CL (ref 12.0–15.0)

## 2013-03-25 LAB — APTT: aPTT: 32 seconds (ref 24–37)

## 2013-03-25 LAB — HEMOGLOBIN A1C
Hgb A1c MFr Bld: 13.5 % — ABNORMAL HIGH (ref <5.7)
Mean Plasma Glucose: 341 mg/dL — ABNORMAL HIGH (ref <117)

## 2013-03-25 LAB — LACTIC ACID, PLASMA: Lactic Acid, Venous: 0.8 mmol/L (ref 0.5–2.2)

## 2013-03-25 LAB — PREPARE RBC (CROSSMATCH)

## 2013-03-25 LAB — MAGNESIUM: Magnesium: 1.7 mg/dL (ref 1.5–2.5)

## 2013-03-25 LAB — PROCALCITONIN: Procalcitonin: 5.52 ng/mL

## 2013-03-25 MED ORDER — OSELTAMIVIR PHOSPHATE 75 MG PO CAPS: 75.0000 mg | ORAL_CAPSULE | Freq: Two times a day (BID) | ORAL | Status: DC

## 2013-03-25 MED ORDER — SILVER SULFADIAZINE 1 % EX CREA: 1.0000 "application " | TOPICAL_CREAM | Freq: Every day | CUTANEOUS | Status: DC

## 2013-03-25 MED ORDER — HYDROCODONE-ACETAMINOPHEN 5-325 MG PO TABS: 2.0000 | ORAL_TABLET | Freq: Four times a day (QID) | ORAL | Status: DC | PRN

## 2013-03-25 MED ORDER — CHLORHEXIDINE GLUCONATE CLOTH 2 % EX PADS
6.0000 | MEDICATED_PAD | Freq: Every day | CUTANEOUS | Status: DC
  Administered 2013-03-26 – 2013-03-27 (×2): 6 via TOPICAL

## 2013-03-25 MED ORDER — AZITHROMYCIN 500 MG IV SOLR
500.0000 mg | Freq: Once | INTRAVENOUS | Status: AC
  Administered 2013-03-25: 500 mg via INTRAVENOUS

## 2013-03-25 MED ORDER — SODIUM CHLORIDE 0.9 % IV SOLN: 1000.0000 mL | Freq: Once | INTRAVENOUS | Status: AC

## 2013-03-25 MED ORDER — INSULIN GLARGINE 100 UNIT/ML ~~LOC~~ SOLN
13.0000 [IU] | Freq: Every day | SUBCUTANEOUS | Status: DC
  Administered 2013-03-25: 13 [IU] via SUBCUTANEOUS
  Filled 2013-03-25 (×3): qty 0.13

## 2013-03-25 MED ORDER — SODIUM CHLORIDE 0.9 % IV SOLN: INTRAVENOUS | Status: DC

## 2013-03-25 MED ORDER — ONDANSETRON HCL 4 MG PO TABS: 4.0000 mg | ORAL_TABLET | Freq: Four times a day (QID) | ORAL | Status: DC | PRN

## 2013-03-25 MED ORDER — ADULT MULTIVITAMIN W/MINERALS CH
1.0000 | ORAL_TABLET | Freq: Every day | ORAL | Status: DC
  Administered 2013-03-25 – 2013-03-27 (×3): 1 via ORAL
  Filled 2013-03-25 (×3): qty 1

## 2013-03-25 MED ORDER — ACETAMINOPHEN 325 MG PO TABS
ORAL_TABLET | ORAL | Status: AC
  Administered 2013-03-25: 650 mg via ORAL
  Filled 2013-03-25: qty 2

## 2013-03-25 MED ORDER — MUPIROCIN 2 % EX OINT
1.0000 "application " | TOPICAL_OINTMENT | Freq: Two times a day (BID) | CUTANEOUS | Status: DC
  Administered 2013-03-26 – 2013-03-27 (×4): 1 via NASAL
  Filled 2013-03-25: qty 22

## 2013-03-25 MED ORDER — ALBUTEROL SULFATE HFA 108 (90 BASE) MCG/ACT IN AERS
2.0000 | INHALATION_SPRAY | Freq: Four times a day (QID) | RESPIRATORY_TRACT | Status: DC | PRN
  Filled 2013-03-25: qty 6.7

## 2013-03-25 MED ORDER — FUROSEMIDE 10 MG/ML IJ SOLN
20.0000 mg | Freq: Once | INTRAMUSCULAR | Status: AC
  Administered 2013-03-26: 20 mg via INTRAVENOUS

## 2013-03-25 MED ORDER — GUAIFENESIN-CODEINE 100-10 MG/5ML PO SOLN: 5.0000 mL | Freq: Three times a day (TID) | ORAL | Status: DC | PRN

## 2013-03-25 MED ORDER — SODIUM CHLORIDE 0.9 % IV SOLN
1000.0000 mL | INTRAVENOUS | Status: DC
  Administered 2013-03-25: 1000 mL via INTRAVENOUS

## 2013-03-25 MED ORDER — SIMVASTATIN 10 MG PO TABS
10.0000 mg | ORAL_TABLET | Freq: Every day | ORAL | Status: DC
  Administered 2013-03-26: 10 mg via ORAL
  Filled 2013-03-25 (×4): qty 1

## 2013-03-25 MED ORDER — SODIUM CHLORIDE 0.9 % IV SOLN: 1000.0000 mL | INTRAVENOUS | Status: DC

## 2013-03-25 MED ORDER — DEXTROSE 5 % IV SOLN
500.0000 mg | INTRAVENOUS | Status: DC
  Administered 2013-03-26 – 2013-03-27 (×2): 500 mg via INTRAVENOUS
  Filled 2013-03-25 (×2): qty 500

## 2013-03-25 MED ORDER — HYDROCODONE-ACETAMINOPHEN 5-325 MG PO TABS: 1.0000 | ORAL_TABLET | ORAL | Status: DC | PRN

## 2013-03-25 MED ORDER — INSULIN ASPART 100 UNIT/ML ~~LOC~~ SOLN
0.0000 [IU] | Freq: Three times a day (TID) | SUBCUTANEOUS | Status: DC
  Administered 2013-03-26: 15 [IU] via SUBCUTANEOUS
  Administered 2013-03-26: 3 [IU] via SUBCUTANEOUS

## 2013-03-25 MED ORDER — OSELTAMIVIR PHOSPHATE 75 MG PO CAPS
75.0000 mg | ORAL_CAPSULE | Freq: Once | ORAL | Status: AC
  Administered 2013-03-25: 75 mg via ORAL
  Filled 2013-03-25: qty 1

## 2013-03-25 MED ORDER — SODIUM CHLORIDE 0.9 % IJ SOLN
3.0000 mL | Freq: Two times a day (BID) | INTRAMUSCULAR | Status: DC
  Administered 2013-03-26: 3 mL via INTRAVENOUS

## 2013-03-25 MED ORDER — CEFTRIAXONE SODIUM 1 G IJ SOLR
INTRAMUSCULAR | Status: AC
  Filled 2013-03-25: qty 10

## 2013-03-25 MED ORDER — DEXTROSE 5 % IV SOLN
1.0000 g | Freq: Once | INTRAVENOUS | Status: AC
  Administered 2013-03-25: 1 g via INTRAVENOUS

## 2013-03-25 MED ORDER — INSULIN ASPART 100 UNIT/ML ~~LOC~~ SOLN
4.0000 [IU] | Freq: Once | SUBCUTANEOUS | Status: AC
  Administered 2013-03-25: 4 [IU] via SUBCUTANEOUS

## 2013-03-25 MED ORDER — ACETAMINOPHEN 650 MG RE SUPP: 650.0000 mg | Freq: Four times a day (QID) | RECTAL | Status: DC | PRN

## 2013-03-25 MED ORDER — MORPHINE SULFATE 2 MG/ML IJ SOLN: 1.0000 mg | INTRAMUSCULAR | Status: DC | PRN

## 2013-03-25 MED ORDER — ACETAMINOPHEN 325 MG PO TABS: 650.0000 mg | ORAL_TABLET | Freq: Four times a day (QID) | ORAL | Status: DC | PRN

## 2013-03-25 MED ORDER — GLUCERNA SHAKE PO LIQD
237.0000 mL | Freq: Two times a day (BID) | ORAL | Status: DC
  Administered 2013-03-25 – 2013-03-27 (×4): 237 mL via ORAL
  Filled 2013-03-25 (×5): qty 237

## 2013-03-25 MED ORDER — INSULIN ASPART 100 UNIT/ML ~~LOC~~ SOLN
0.0000 [IU] | Freq: Every day | SUBCUTANEOUS | Status: DC
  Administered 2013-03-25: 2 [IU] via SUBCUTANEOUS

## 2013-03-25 MED ORDER — SODIUM CHLORIDE 0.9 % IV BOLUS (SEPSIS)
2000.0000 mL | Freq: Once | INTRAVENOUS | Status: AC
  Administered 2013-03-25: 2000 mL via INTRAVENOUS

## 2013-03-25 MED ORDER — ACETAMINOPHEN 325 MG PO TABS
650.0000 mg | ORAL_TABLET | Freq: Once | ORAL | Status: AC
  Administered 2013-03-25: 650 mg via ORAL

## 2013-03-25 MED ORDER — OSELTAMIVIR PHOSPHATE 30 MG PO CAPS
30.0000 mg | ORAL_CAPSULE | Freq: Two times a day (BID) | ORAL | Status: DC
  Filled 2013-03-25: qty 1

## 2013-03-25 MED ORDER — INSULIN GLARGINE 100 UNIT/ML ~~LOC~~ SOLN
9.0000 [IU] | Freq: Every day | SUBCUTANEOUS | Status: DC
  Administered 2013-03-25: 9 [IU] via SUBCUTANEOUS
  Filled 2013-03-25 (×4): qty 0.09

## 2013-03-25 MED ORDER — BACITRACIN ZINC 500 UNIT/GM EX OINT
TOPICAL_OINTMENT | Freq: Every day | CUTANEOUS | Status: DC
  Administered 2013-03-25 – 2013-03-27 (×3): via TOPICAL
  Filled 2013-03-25 (×2): qty 28.35

## 2013-03-25 MED ORDER — ONDANSETRON HCL 4 MG/2ML IJ SOLN: 4.0000 mg | Freq: Four times a day (QID) | INTRAMUSCULAR | Status: DC | PRN

## 2013-03-25 MED ORDER — OSELTAMIVIR PHOSPHATE 6 MG/ML PO SUSR
30.0000 mg | Freq: Two times a day (BID) | ORAL | Status: DC
  Filled 2013-03-25: qty 5

## 2013-03-25 MED ORDER — DEXTROSE 5 % IV SOLN
1.0000 g | INTRAVENOUS | Status: DC
  Administered 2013-03-26 – 2013-03-27 (×2): 1 g via INTRAVENOUS
  Filled 2013-03-25 (×2): qty 10

## 2013-03-25 NOTE — H&P (Addendum)
[facility] History and Physical  [name] [id number] DOB: 01/14/1971 DOA: 03/25/2013  Referring physician: ER physician PCP: Luetta Nutting, DO   Chief Complaint: fevers, chills  HPI:  Pt is 42 yo female with past medical history of diabetes, diabetic neuropathy, dyslipidemia, hypertension who presented to [facility] ED (from [facility]) due to high blood sugars. Pt reported that at 11 pm the night prior to this admission she checked her CBG and it read HH. She gave herself extra dose of Lantus specifically 4 units of Lantus extra (normally it was supposed to be 13 in am and 9 at bedtime which was recently changed to 16 in am and 5 at bedtime). At 3 am she started to feel muscle cramps, jittery, shaking and with constant urination. She checked her CBG and it was then low. Since it was fluctuating, she decided to go to ED for evaluation. She did not have fever or chills. No GU symptoms such as dysuria, hematuria but as mentioned earlier she has urinary frequency. She reported no chest pain, palpitations or cough. No abdominal pain, nausea or vomiting.  Her BP on the admission was 89/65 but it has improved to 129/61. Once she arrived to the unit we stopped IV fluids due to LE edema and she was eating fine so we put IV fluids to [facility]. In addition, her CBC revealed Hgb of 5.8 and initially she refused the blood transfusion. After talking to her she finally agreed to have it so she will be receiving 2 units PRBC with lasix in between the transfusion units. Creatinine was 1.4 on the admission.   Assessment and Plan:  Principal Problem:   Sepsis - secondary to UTI and/or pneumonia; BP 89/65 on the admission, HR of 114 and RR of 26 in addition to T max of 102.1 F with evidence of UTI and pneumonia of UA and CXR respectively. This satisfies criteria of sepsis - did not have urinary symptoms except for urinary frequency but UA showed large leukocytes. In addition, her CXR had increased interstitial  markings concerning for edema or pneumonia - we stopped IV fluids once she arrived to SDU and she will be getting one dose of IV lasix 20 mg after 1st unit of PRBC - check blood cultures, urine culture, procalcitonin, lactic acid - continue azithromycin and rocephin  Which would cover for UTI and/or pneumonia - influenza negative so stop tamiflu  Active Problems:   UTI (urinary tract infection) - suggestive based on UA - follow up urine culture results - Rocephin should b adequate in coverage    CAP (community acquired pneumonia) - sputum analysis, strep pneumo and urine legionella ordered - Zithromax and Rocephin as noted above - pneumonia order set in place - oxygen support via nasal canula to keep O2 saturation above 90%   Hypertension - BP slightly on the soft side on admission  - hold Losartan for now    Hyponatremia  - most likely secondary to pre renal etiology   Hypercholesteremia - continue statin    Type I (juvenile type) diabetes mellitus without mention of complication, uncontrolled - with neuropathies - will check A1C - continue long acting insulin and place on SSI as well    Acute blood loss anemia - microcytic, has menstrual period at this time (h/o heavy menstruations) - FOBT negative - will get 2 units PRBC with lasix 20 mg IV in between   Acute renal failure - likely pre renal etiology  - improved since admission  Acidosis, metabolic - secondary to sepsis as noted above   Transaminitis  - unclear etiology at this time, possibly viral  - repeat CMP in am   Leukocytosis, unspecified - secondary to  UTI, PNA - ABX as noted above and repeat CBC in AM   DVT prophylaxis - SCD's   Radiological Exams on Admission: Dg Chest 2 View   03/25/2013  The lungs are adequately inflated but demonstrate increased interstitial markings diffusely suggesting mild interstitial edema versus early interstitial pneumonia. In addition confluent density in the retrocardiac region  on the right is worrisome for atelectasis or early pneumonia.   Code Status: Full Family Communication: Friend at the bedside Disposition Plan: Admit for further evaluation and management   Leisa Lenz, MD  [facility] Pager [phone]  Review of Systems:  Constitutional: Negative for diaphoresis.  HENT: Negative for hearing loss, ear pain, nosebleeds, congestion, sore throat, neck pain, tinnitus and ear discharge.   Eyes: Negative for blurred vision, double vision, photophobia, pain, discharge and redness.  Respiratory: Negative for hemoptysis, wheezing and stridor.   Cardiovascular: Negative for chest pain, palpitations, orthopnea, claudication and leg swelling.  Gastrointestinal:  Negative for heartburn, constipation, blood in stool and melena.  Genitourinary: Negative for dysuria, urgency, frequency, hematuria and flank pain.  Musculoskeletal: Negative for myalgias, back pain, joint pain and falls.  Skin: Negative for itching and rash.  Neurological: Negative for tingling, positive for tremors, has chronic sensory change in regards to sensation loss in bilateral feet Endo/Heme/Allergies: Negative for environmental allergies and polydipsia. Does not bruise/bleed easily.  Psychiatric/Behavioral: Negative for suicidal ideas. The patient is not nervous/anxious.      Past Medical History  Diagnosis Date  . Diabetes mellitus   . Neuropathy in diabetes   . Hypertension   . Hypercholesteremia   . H/O seasonal allergies   . Anemia   . Left foot infection    Past Surgical History  Procedure Laterality Date  . Cesarean section    . Eye surgery    . Hemrrhoidectomy    . Peripherally inserted central catheter insertion     Social History:  reports that she quit smoking about 4 years ago. She does not have any smokeless tobacco history on file. She reports that she drinks about 0.5 ounces of alcohol per week. She reports that she does not use illicit drugs.  Allergies   Allergen Reactions  . Ciprofloxacin Itching  . Cleocin [Clindamycin Hcl]     Diarrhea     Family History: Family medical history significant for HTN, HLD   Prior to Admission medications   Medication Sig Start Date End Date Taking? Authorizing Provider  albuterol (PROVENTIL HFA;VENTOLIN HFA) 108 (90 BASE) MCG/ACT inhaler Inhale 2 puffs into the lungs every 6 (six) hours as needed. S.O.B.    Historical Provider, MD  amoxicillin-clavulanate (AUGMENTIN) 500-125 MG per tablet Take 1 tablet (500 mg total) by mouth every 8 (eight) hours. 11/17/12   Veryl Speak, MD  aspirin 325 MG tablet Take 325 mg by mouth daily.    Historical Provider, MD  azithromycin (ZITHROMAX) 250 MG tablet Take 1 tablet (250 mg total) by mouth daily. Take first 2 tablets together, then 1 every day until finished. 04/04/12   Veryl Speak, MD  cephALEXin (KEFLEX) 500 MG capsule Take 1 capsule (500 mg total) by mouth 2 (two) times daily. 02/09/13   Julianne Rice, MD  guaiFENesin-codeine [facility]) 100-10 MG/5ML syrup Take 5 mLs by mouth 3 (three) times daily as needed  for cough. 04/04/12   Veryl Speak, MD  HYDROcodone-acetaminophen (NORCO/VICODIN) 5-325 MG per tablet Take 2 tablets by mouth every 6 (six) hours as needed for pain. 08/11/12   Elwyn Lade, PA-C  insulin glargine (LANTUS) 100 UNIT/ML injection Inject 22 Units into the skin 2 (two) times daily. Takes 13 units in am and 9 units at night    Historical Provider, MD  insulin lispro (HUMALOG) 100 UNIT/ML injection Inject 2-7 Units into the skin 3 (three) times daily before meals. Pt on sliding scale    Historical Provider, MD  losartan (COZAAR) 25 MG tablet Take 1 tablet (25 mg total) by mouth daily. 02/06/13   Elayne Snare, MD  pravastatin (PRAVACHOL) 20 MG tablet Take 1 tablet (20 mg total) by mouth daily. 02/06/13   Elayne Snare, MD  silver sulfADIAZINE (SILVADENE) 1 % cream Apply 1 application topically daily. 02/09/13   Julianne Rice, MD   Physical  Exam: Filed Vitals:   03/25/13 0902 03/25/13 1003 03/25/13 1109 03/25/13 1200  BP:  113/65 95/58 89/65   Pulse: 98 94 92   Temp: 100.6 F (38.1 C)  98.3 F (36.8 C)   TempSrc: Oral  Oral   Resp: 14 18 16    Height:      Weight:      SpO2: 97% 97% 100%     Physical Exam  Constitutional: Appears well-developed and well-nourished. No distress.  HENT: Normocephalic. External right and left ear normal. Dry MM Eyes: Conjunctivae and EOM are normal. PERRLA, no scleral icterus.  Neck: Normal ROM. Neck supple. No JVD. No tracheal deviation. No thyromegaly.  CVS: RRR, S1/S2 +, no murmurs, no gallops, no carotid bruit.  Pulmonary: Effort and breath sounds normal, no stridor, rhonchi, wheezes, rales.  Abdominal: Soft. BS +,  no distension, tenderness, rebound or guarding.  Musculoskeletal: Normal range of motion. LE +1 pitting edema but no tenderness. Has small erythema on left foot (4th and 5th toe, no ulceration) Lymphadenopathy: No lymphadenopathy noted, cervical, inguinal. Neuro: Alert. Normal reflexes, muscle tone coordination. No cranial nerve deficit. Has loss of sensation in bilateral feet Skin: Skin is warm and dry. No rash noted. Not diaphoretic. No erythema. No pallor.  Psychiatric: Normal mood and affect. Behavior, judgment, thought content normal.   Labs on Admission:  Basic Metabolic Panel:  Recent Labs Lab 03/25/13 0732  NA 129*  K 5.1  CL 100  CO2 18*  GLUCOSE 193*  BUN 34*  CREATININE 1.40*  CALCIUM 8.3*   Liver Function Tests:  Recent Labs Lab 03/25/13 0732  AST 41*  ALT 40*  ALKPHOS 236*  BILITOT 0.3  PROT 6.5  ALBUMIN 2.5*   CBC:  Recent Labs Lab 03/25/13 0732 03/25/13 0820  WBC 16.9*  --   NEUTROABS 15.7*  --   HGB 5.8* 5.2*  HCT 19.7* 17.5*  MCV 66.1*  --   PLT 395  --    If 7PM-7AM, please contact night-coverage [email or web address] Password [facility] 03/25/2013, 12:46 PM

## 2013-03-25 NOTE — Progress Notes (Signed)
Chart and information reviewed/ next review due on [id number]/Rhonda Davis,RN,BSN,CCM

## 2013-03-25 NOTE — Progress Notes (Signed)
PT Cancellation Note  Patient Details Name: [name] MRN: [id number] DOB: 02-03-1971   Cancelled Treatment:    Reason Eval/Treat Not Completed: Patient not medically ready (Hgb 5.2) Will see 12/26.   [facility] 03/25/2013, 1:22 PM

## 2013-03-25 NOTE — ED Notes (Addendum)
Hemoglobin 5.8 reported to EDP. Will repeat to verify.

## 2013-03-25 NOTE — ED Notes (Signed)
Patient states that last night she started having fever, N/V. Has not taken any medication for her fever.

## 2013-03-25 NOTE — Progress Notes (Signed)
INITIAL NUTRITION ASSESSMENT  DOCUMENTATION CODES Per approved criteria  -Not Applicable   INTERVENTION: Provide Glucerna shakes BID Provide Multivitamin with minerals daily RD to follow-up  NUTRITION DIAGNOSIS: Predicted suboptimal energy intake related to recent weight loss as evidenced by 8% weight loss in less than 2 months.   Goal: Pt to meet >/= 90% of their estimated nutrition needs   Monitor:  PO intake Weight Labs  Reason for Assessment: Consult for diet  42 y.o. female  Admitting Dx: Sepsis  ASSESSMENT: 42 year old female with history of diabetes, neuropathy in diabetes, and hypertension presents with shaking chills and subjective fever and multiple episodes of vomiting, and generalized weakness onset approximately 2 AM today. Upon evaluation in the ED her hemoglobin is 5.8, repeat is 5.2. Chest x-ray showing interstitial infiltrates.  Pt very lethargic at time of visit. She reports not eating PTA or today. She answered "I don't know" to all other questions then closed her eyes and stopped responding. RD to follow-up at a later date. Diet education not appropriate at this time.  Weight history shows 8% weight loss in the past 2 months.   Height: Ht Readings from Last 1 Encounters:  03/25/13 5\' 7"  (1.702 m)    Weight: Wt Readings from Last 1 Encounters:  03/25/13 120 lb (54.432 kg)    Ideal Body Weight: 135 lbs  % Ideal Body Weight: 89%  Wt Readings from Last 10 Encounters:  03/25/13 120 lb (54.432 kg)  02/06/13 132 lb 6.4 oz (60.056 kg)  02/04/13 130 lb (58.968 kg)  12/24/12 126 lb (57.153 kg)  11/16/12 131 lb (59.421 kg)  10/26/12 125 lb (56.7 kg)  08/11/12 125 lb (56.7 kg)  04/04/12 130 lb 8 oz (59.194 kg)  02/02/12 138 lb 3.2 oz (62.687 kg)  02/02/12 120 lb (54.432 kg)    Usual Body Weight: unknown  % Usual Body Weight: NA  BMI:  Body mass index is 18.79 kg/(m^2).  Estimated Nutritional Needs: Kcal: 1600-1800 Protein: 55-65  grams Fluid: 2 L/day  Skin: +3 RLE and LLE edema; diabetic ulcer of left toe  Diet Order: Carb Control  EDUCATION NEEDS: -Education not appropriate at this time   Intake/Output Summary (Last 24 hours) at 03/25/13 1307 Last data filed at 03/25/13 1200  Gross per 24 hour  Intake  93.75 ml  Output      0 ml  Net  93.75 ml    Last BM: 12/24  Labs:   Recent Labs Lab 03/25/13 0732  NA 129*  K 5.1  CL 100  CO2 18*  BUN 34*  CREATININE 1.40*  CALCIUM 8.3*  GLUCOSE 193*    CBG (last 3)  No results found for this basename: GLUCAP,  in the last 72 hours  Scheduled Meds: . azithromycin  500 mg Intravenous Q24H  . [START ON 03/26/2013] cefTRIAXone (ROCEPHIN)  IV  1 g Intravenous Q24H  . insulin aspart  0-15 Units Subcutaneous TID WC  . insulin aspart  0-5 Units Subcutaneous QHS  . insulin glargine  13 Units Subcutaneous Daily  . insulin glargine  9 Units Subcutaneous QHS  . simvastatin  10 mg Oral q1800  . sodium chloride  3 mL Intravenous Q12H    Continuous Infusions: . sodium chloride 1,000 mL (03/25/13 1115)    Past Medical History  Diagnosis Date  . Diabetes mellitus   . Neuropathy in diabetes   . Hypertension   . Hypercholesteremia   . H/O seasonal allergies   . Anemia   .  Left foot infection     Past Surgical History  Procedure Laterality Date  . Cesarean section    . Eye surgery    . Hemrrhoidectomy    . Peripherally inserted central catheter insertion      Pryor Ochoa RD, LDN Inpatient Clinical Dietitian Pager: [phone] After Hours Pager: [phone]

## 2013-03-25 NOTE — Progress Notes (Signed)
pt refused blood per [facility] nurse.  Ask pt again, again she states "no, having someone else's blood in me is disgusting".

## 2013-03-25 NOTE — ED Provider Notes (Signed)
CSN: [id number]     Arrival date & time 03/25/13  0700 History   First MD Initiated Contact with Patient 03/25/13 0719     Chief Complaint  Patient presents with  . Fever   (Consider location/radiation/quality/duration/timing/severity/associated sxs/prior Treatment) HPI Complains of shaking chills and subjective fever and multiple episodes of vomiting, and generalized weakness onset approximately 2 AM today. Admits to slight cough onset this morning on the way here and shortness of breath no diarrhea no other associated symptoms. Her blood sugar read high last night she treated herself with extra insulin. No other associated symptoms. Nothing makes symptoms better or worse. Associated symptoms include diffuse myalgias. No other complaint. Past Medical History  Diagnosis Date  . Diabetes mellitus   . Neuropathy in diabetes   . Hypertension   . Hypercholesteremia   . H/O seasonal allergies   . Anemia   . Left foot infection    Past Surgical History  Procedure Laterality Date  . Cesarean section    . Eye surgery    . Hemrrhoidectomy    . Peripherally inserted central catheter insertion     No family history on file. History  Substance Use Topics  . Smoking status: Former Smoker    Quit date: 05/16/2008  . Smokeless tobacco: Not on file  . Alcohol Use: 0.5 oz/week    1 drink(s) per week     Comment: Occasional   OB History   Grav Para Term Preterm Abortions TAB SAB Ect Mult Living                 Review of Systems  Constitutional: Positive for fever and chills.  HENT: Negative.   Respiratory: Positive for cough and shortness of breath.   Cardiovascular: Positive for leg swelling.       Chronic leg edema  Gastrointestinal: Positive for vomiting.       Denies nausea at present  Genitourinary:       Currently on menses  Musculoskeletal: Negative.   Skin: Positive for wound.       Followed in wound clinic for nonhealing wounds on toes are several months   Allergic/Immunologic: Positive for immunocompromised state.       Diabetic  Neurological: Positive for weakness.  Psychiatric/Behavioral: Negative.   All other systems reviewed and are negative.    Allergies  Ciprofloxacin and Cleocin  Home Medications   Current Outpatient Rx  Name  Route  Sig  Dispense  Refill  . albuterol (PROVENTIL HFA;VENTOLIN HFA) 108 (90 BASE) MCG/ACT inhaler   Inhalation   Inhale 2 puffs into the lungs every 6 (six) hours as needed. S.O.B.         . amoxicillin-clavulanate (AUGMENTIN) 500-125 MG per tablet   Oral   Take 1 tablet (500 mg total) by mouth every 8 (eight) hours.   30 tablet   0   . aspirin 325 MG tablet   Oral   Take 325 mg by mouth daily.         Marland Kitchen azithromycin (ZITHROMAX) 250 MG tablet   Oral   Take 1 tablet (250 mg total) by mouth daily. Take first 2 tablets together, then 1 every day until finished.   6 tablet   0   . cephALEXin (KEFLEX) 500 MG capsule   Oral   Take 1 capsule (500 mg total) by mouth 2 (two) times daily.   20 capsule   0   . guaiFENesin-codeine (ROBITUSSIN AC) 100-10 MG/5ML syrup   Oral  Take 5 mLs by mouth 3 (three) times daily as needed for cough.   120 mL   0   . HYDROcodone-acetaminophen (NORCO/VICODIN) 5-325 MG per tablet   Oral   Take 2 tablets by mouth every 6 (six) hours as needed for pain.   6 tablet   0   . insulin glargine (LANTUS) 100 UNIT/ML injection   Subcutaneous   Inject 22 Units into the skin 2 (two) times daily. Takes 13 units in am and 9 units at night         . insulin lispro (HUMALOG) 100 UNIT/ML injection   Subcutaneous   Inject 2-7 Units into the skin 3 (three) times daily before meals. Pt on sliding scale         . losartan (COZAAR) 25 MG tablet   Oral   Take 1 tablet (25 mg total) by mouth daily.   30 tablet   3   . pravastatin (PRAVACHOL) 20 MG tablet   Oral   Take 1 tablet (20 mg total) by mouth daily.   30 tablet   3   . silver sulfADIAZINE  (SILVADENE) 1 % cream   Topical   Apply 1 application topically daily.   50 g   0    BP 129/61  Pulse 114  Temp(Src) 102.1 F (38.9 C) (Oral)  Resp 20  Ht 5\' 7"  (1.702 m)  Wt 120 lb (54.432 kg)  BMI 18.79 kg/m2  SpO2 92% Physical Exam  Nursing note and vitals reviewed. Constitutional: She is oriented to person, place, and time. She appears well-developed and well-nourished. She appears distressed.  Appears mildly uncomfortable Glasgow Coma Score 15  HENT:  Head: Normocephalic and atraumatic.  Left Ear: External ear normal.  Mouth/Throat: No oropharyngeal exudate.  Degrees membranes dry oropharynx not reddened  Eyes: Conjunctivae are normal. Pupils are equal, round, and reactive to light.  Neck: Neck supple. No tracheal deviation present. No thyromegaly present.  Cardiovascular: Regular rhythm.   No murmur heard. Tachycardia  Pulmonary/Chest: Effort normal and breath sounds normal.  Abdominal: Soft. Bowel sounds are normal. She exhibits no distension. There is no tenderness.  Genitourinary:  Rectal normal tone brown stool no gross blood  Musculoskeletal: Normal range of motion. She exhibits edema. She exhibits no tenderness.  2+ pretibial pitting edema bilaterally. Left foot third toe Emilie reddened. Not tender. Great toe of right foot with nail removed, minimally reddened, nontender. All 4 extremities neurovascularly intact.  Neurological: She is alert and oriented to person, place, and time. No cranial nerve deficit. Coordination normal.  Glasgow Coma Score 15 alert appropriate  Skin: Skin is warm and dry. No rash noted.  Psychiatric: She has a normal mood and affect.    ED Course  Procedures (including critical care time) Labs Review Labs Reviewed - No data to display Imaging Review No results found.  EKG Interpretation   None      9 AM patient feels improved after treatment with intravenous fluids and Tylenol. Chest x-ray viewed by me. All Lab work reviewed  by me Results for orders placed during the hospital encounter of 03/25/13  COMPREHENSIVE METABOLIC PANEL      Result Value Range   Sodium 129 (*) 135 - 145 mEq/L   Potassium 5.1  3.5 - 5.1 mEq/L   Chloride 100  96 - 112 mEq/L   CO2 18 (*) 19 - 32 mEq/L   Glucose, Bld 193 (*) 70 - 99 mg/dL   BUN 34 (*) 6 -  23 mg/dL   Creatinine, Ser 1.40 (*) 0.50 - 1.10 mg/dL   Calcium 8.3 (*) 8.4 - 10.5 mg/dL   Total Protein 6.5  6.0 - 8.3 g/dL   Albumin 2.5 (*) 3.5 - 5.2 g/dL   AST 41 (*) 0 - 37 U/L   ALT 40 (*) 0 - 35 U/L   Alkaline Phosphatase 236 (*) 39 - 117 U/L   Total Bilirubin 0.3  0.3 - 1.2 mg/dL   GFR calc non Af Amer 46 (*) >90 mL/min   GFR calc Af Amer 53 (*) >90 mL/min  CBC WITH DIFFERENTIAL      Result Value Range   WBC 16.9 (*) 4.0 - 10.5 K/uL   RBC 2.98 (*) 3.87 - 5.11 MIL/uL   Hemoglobin 5.8 (*) 12.0 - 15.0 g/dL   HCT 19.7 (*) 36.0 - 46.0 %   MCV 66.1 (*) 78.0 - 100.0 fL   MCH 19.5 (*) 26.0 - 34.0 pg   MCHC 29.4 (*) 30.0 - 36.0 g/dL   RDW 17.1 (*) 11.5 - 15.5 %   Platelets 395  150 - 400 K/uL   Neutrophils Relative % 85 (*) 43 - 77 %   Lymphocytes Relative 4 (*) 12 - 46 %   Monocytes Relative 2 (*) 3 - 12 %   Eosinophils Relative 1  0 - 5 %   Basophils Relative 0  0 - 1 %   Band Neutrophils 8  0 - 10 %   Metamyelocytes Relative 0     Myelocytes 0     Promyelocytes Absolute 0     Blasts 0     nRBC 0  0 /100 WBC   Neutro Abs 15.7 (*) 1.7 - 7.7 K/uL   Lymphs Abs 0.7  0.7 - 4.0 K/uL   Monocytes Absolute 0.3  0.1 - 1.0 K/uL   Eosinophils Absolute 0.2  0.0 - 0.7 K/uL   Basophils Absolute 0.0  0.0 - 0.1 K/uL   RBC Morphology POLYCHROMASIA PRESENT     WBC Morphology TOXIC GRANULATION    PREGNANCY, URINE      Result Value Range   Preg Test, Ur NEGATIVE  NEGATIVE  HEMOGLOBIN AND HEMATOCRIT, BLOOD      Result Value Range   Hemoglobin 5.2 (*) 12.0 - 15.0 g/dL   HCT 17.5 (*) 36.0 - 46.0 %  OCCULT BLOOD X 1 CARD TO LAB, STOOL      Result Value Range   Fecal Occult Bld  NEGATIVE  NEGATIVE  POCT I-STAT 3, BLOOD GAS (G3P V)      Result Value Range   pH, Ven 7.365 (*) 7.250 - 7.300   pCO2, Ven 33.7 (*) 45.0 - 50.0 mmHg   pO2, Ven 31.0  30.0 - 45.0 mmHg   Bicarbonate 18.9 (*) 20.0 - 24.0 mEq/L   TCO2 20  0 - 100 mmol/L   O2 Saturation 51.0     Acid-base deficit 6.0 (*) 0.0 - 2.0 mmol/L   Patient temperature 102.1 F     Collection site IV START     Drawn by Operator     Sample type VENOUS     Comment NOTIFIED PHYSICIAN    CG4 I-STAT (LACTIC ACID)      Result Value Range   Lactic Acid, Venous 1.44  0.5 - 2.2 mmol/L   Dg Chest 2 View  03/25/2013   CLINICAL DATA:  Fever and chills and nausea and vomiting.  EXAM: CHEST  2 VIEW  COMPARISON:  Portable  chest x-ray of June 02, 2011  FINDINGS: The lungs are adequately inflated. The interstitial markings are increased bilaterally when compared to the previous study but they are not entirely new. Coarse retrocardiac lung markings are visible on the lateral film. The cardiopericardial silhouette is not enlarged. The pulmonary vascularity is prominent centrally. There is no pleural effusion. There is no pneumothorax. The observed portions of the bony thorax appear normal.  IMPRESSION: The lungs are adequately inflated but demonstrate increased interstitial markings diffusely suggesting mild interstitial edema versus early interstitial pneumonia. In addition confluent density in the retrocardiac region on the right is worrisome for atelectasis or early pneumonia.  Follow-up films following therapy are recommended to assure clearing.   Electronically Signed   By: David  Martinique   On: 03/25/2013 08:14    MDM  No diagnosis found. Code sepsis called In light of chest x-ray findings cough and fever we'll treat for community acquired pneumonia. I've spoken with patient at length about blood transfusion. She is reluctant at present. Case discussed with Dr. Hartford Poli .  Plan transfer [facility] step down unit. Antibiotics  Tamiflu intravenous fluids oxygen Diagnosis #1 sepsis #2 community-acquired pneumonia #3 anemia #4 hyperglycemia #5 renal insufficiency #6 hyponatremia CRITICAL CARE Performed by: Orlie Dakin Total critical care time: 45 minute Critical care time was exclusive of separately billable procedures and treating other patients. Critical care was necessary to treat or prevent imminent or life-threatening deterioration. Critical care was time spent personally by me on the following activities: development of treatment plan with patient and/or surrogate as well as nursing, discussions with consultants, evaluation of patient's response to treatment, examination of patient, obtaining history from patient or surrogate, ordering and performing treatments and interventions, ordering and review of laboratory studies, ordering and review of radiographic studies, pulse oximetry and re-evaluation of patient's condition.   Orlie Dakin, MD 03/25/13 [phone]

## 2013-03-25 NOTE — Progress Notes (Signed)
/  PENDING ACCEPTANCE TRANFER NOTE:  Call received from:    Orlie Dakin, MD  REASON FOR REQUESTING TRANSFER:    Sepsis/anemia  HPI:   42 years old with history of diabetes presented with fever/chills as well as vomiting. Fever of 102.1, UTI. Upon evaluation in the ED her hemoglobin is 5.8, repeat is 5.2. Chest x-ray showing interstitial infiltrates. Started on IV antibiotics, she will probably need blood transfusion Initially accepted to step down bed at [facility], there is no step down bed available in [facility] so patient will be transferred to [place] manager is aware.   PLAN:  According to telephone report, this patient was accepted for transfer to [facility],   Under [facility] team:  ,  I have requested an order be written to call Flow Manager at [phone] upon patient arrival to the floor for final physician assignment who will do the admission and give admitting orders.  SIGNED: Birdie Hopes, MD [facility]  03/25/2013, 10:05 AM

## 2013-03-26 ENCOUNTER — Inpatient Hospital Stay (HOSPITAL_COMMUNITY): Payer: Medicaid Other

## 2013-03-26 DIAGNOSIS — N179 Acute kidney failure, unspecified: Secondary | ICD-10-CM

## 2013-03-26 LAB — COMPREHENSIVE METABOLIC PANEL
AST: 51 U/L — ABNORMAL HIGH (ref 0–37)
Albumin: 2.3 g/dL — ABNORMAL LOW (ref 3.5–5.2)
Alkaline Phosphatase: 272 U/L — ABNORMAL HIGH (ref 39–117)
BUN: 33 mg/dL — ABNORMAL HIGH (ref 6–23)
Calcium: 8.2 mg/dL — ABNORMAL LOW (ref 8.4–10.5)
Chloride: 101 mEq/L (ref 96–112)
GFR calc Af Amer: 54 mL/min — ABNORMAL LOW (ref >90)
GFR calc non Af Amer: 46 mL/min — ABNORMAL LOW (ref >90)
Glucose, Bld: 283 mg/dL — ABNORMAL HIGH (ref 70–99)
Potassium: 5.5 mEq/L — ABNORMAL HIGH (ref 3.5–5.1)
Total Bilirubin: 0.4 mg/dL (ref 0.3–1.2)
Total Protein: 6.5 g/dL (ref 6.0–8.3)

## 2013-03-26 LAB — LEGIONELLA ANTIGEN, URINE: Legionella Antigen, Urine: NEGATIVE

## 2013-03-26 LAB — CBC
HCT: 29.1 % — ABNORMAL LOW (ref 36.0–46.0)
MCH: 22.1 pg — ABNORMAL LOW (ref 26.0–34.0)
MCV: 70.8 fL — ABNORMAL LOW (ref 78.0–100.0)
Platelets: 366 10*3/uL (ref 150–400)
RBC: 4.11 MIL/uL (ref 3.87–5.11)
RDW: 21.7 % — ABNORMAL HIGH (ref 11.5–15.5)
WBC: 15.9 10*3/uL — ABNORMAL HIGH (ref 4.0–10.5)

## 2013-03-26 LAB — URINE CULTURE: Colony Count: 6000

## 2013-03-26 LAB — GLUCOSE, CAPILLARY
Glucose-Capillary: 93 mg/dL (ref 70–99)
Glucose-Capillary: 370 mg/dL — ABNORMAL HIGH (ref 70–99)
Glucose-Capillary: 160 mg/dL — ABNORMAL HIGH (ref 70–99)

## 2013-03-26 MED ORDER — INSULIN GLARGINE 100 UNIT/ML ~~LOC~~ SOLN
13.0000 [IU] | Freq: Once | SUBCUTANEOUS | Status: AC
  Administered 2013-03-26: 13 [IU] via SUBCUTANEOUS
  Filled 2013-03-26: qty 0.13

## 2013-03-26 MED ORDER — FUROSEMIDE 10 MG/ML IJ SOLN
20.0000 mg | Freq: Once | INTRAMUSCULAR | Status: AC
  Administered 2013-03-26: 20 mg via INTRAVENOUS
  Filled 2013-03-26: qty 2

## 2013-03-26 NOTE — Progress Notes (Signed)
Inpatient Diabetes Program Recommendations  AACE/ADA: New Consensus Statement on Inpatient Glycemic Control (2013)  Target Ranges:  Prepandial:   less than 140 mg/dL      Peak postprandial:   less than 180 mg/dL (1-2 hours)      Critically ill patients:  140 - 180 mg/dL   Reason for Assessment: Consult regarding type 1 diabetes with poor control  Results for [name], [name] (MRN [id number]) as of 03/26/2013 13:05  Ref. Range 03/25/2013 18:20 03/25/2013 21:34 03/26/2013 07:54 03/26/2013 07:57 03/26/2013 08:30 03/26/2013 09:02 03/26/2013 12:17  Glucose-Capillary Latest Range: 70-99 mg/dL 219 (H) 227 (H) 43 (LL) 42 (LL) 48 (L) 93 370 (H)   Note: Patient with elevated Hgb A1C of 13.5 indicating poor glycemic control prior to admission with possibly mean glucose of around 341mg /dl.  However, Hgb noted to be very low at 5.3-- which could affect accuracy of Hgb A1C either up or down. 12-24-12 Hgb A1C was 13.3 and Hgb at that time now available, so may be close to accurate after all.  Strongly suspect patient was not taking insulin consistently prior to admission-- but home insulin could have become denatured for some reason.   While patient is in hospital will need to receive Lantus insulin consistently, correction Novolog, and meal coverage since she has type 1 diabetes.  Patient experiencing acute renal failure, so may need a temporary reduction in Lantus dose until kidney function normalizes.  If patient hypoglycemic again, MD may want to consider reducing Lantus doses by 10%.  Patient needs order for meal coverage-- Novolog 3 units tid with meals to be given in addition to correction scale as long as patient eats at least 50% of meal and CBG is > 80 mg/dl.  Patient ate about 40% of breakfast so anticipate appetite may increase.  Is also getting Glucerna supplement.  When has order for meal coverage, may benefit by reduction in correction scale from moderate to sensitive.  Diabetes Coordinator  will follow-up tomorrow, 03/27/13.  Thank you for the consult.  Rylynne Schicker S. Marcelline Mates, RN, CNS, CDE Inpatient Diabetes Program, team pager [phone]

## 2013-03-26 NOTE — Progress Notes (Signed)
Dr. Charlies Silvers notified of CBG result of 370. Lantus 13 units given and sliding scale coverage givenwith Novolog 15 units. Jacobb Alen, Beverly Gust, RN

## 2013-03-26 NOTE — Progress Notes (Signed)
Patient c/o abdominal distention and discomforts,  Dr Charlies Silvers made aware, bladder not distended , abdomen distended but soft, patient having loose stools, but unable to collect sample, patient instructed, Abdominal series done , results unremarkable.Patient instructed to ambulate more.

## 2013-03-26 NOTE — Progress Notes (Signed)
CBG 42 this am, Glucerna given repeat sugar 42 OJ 4 oz given, pt eating breakfast no symptoms, repeat CBG 98. Dr Charlies Silvers notified.  AM Lantus insulin held.

## 2013-03-26 NOTE — Progress Notes (Signed)
[facility] PROGRESS NOTE  [name] [id number] DOB: 19-Oct-1970 DOA: 03/25/2013 PCP: Luetta Nutting, DO  Brief narrative: Pt is 42 yo female with past medical history of diabetes, diabetic neuropathy, dyslipidemia, hypertension who presented to [facility] ED (from [facility]) due to high blood sugars. Pt reported that at 11 pm the night prior to this admission she checked her CBG and it read HH. She gave herself extra dose of Lantus specifically 4 units of Lantus extra (normally it was supposed to be 13 in am and 9 at bedtime which was recently changed to 16 in am and 5 at bedtime). At 3 am she started to feel muscle cramps, jittery, shaking and with constant urination. She checked her CBG and it was then low. Since it was fluctuating, she decided to go to ED for evaluation.  Her BP on the admission was 89/65 but it has improved to 129/61. IV fluids stopped once pt arrived to SDU due to LE edema. In addition, her CBC revealed Hgb of 5.8. Creatinine was 1.4 on the admission.   Assessment and Plan:   Principal Problem:  Sepsis secondary to UTI and CAP - secondary to UTI and/or pneumonia; BP 89/65 on the admission, HR of 114 and RR of 26 in addition to T max of 102.1 F with evidence of UTI and pneumonia of UA and CXR respectively. This satisfies criteria of sepsis, In addition, procalcitonin was elevated at 5.52. Lactic acid was WNL - UA showed large leukocytes. CXR had increased interstitial markings concerning for edema or pneumonia  - given one dose of IV lasix 20 mg after 1st unit of PRBC  - follow up final blood culture results - strep pneumo negative; legionella pending,. Influenza negative  - continue azithromycin and rocephin   Active Problems:  UTI (urinary tract infection)  - follow up urine culture results  - continue rocephin 1 gm daily IV CAP (community acquired pneumonia)  - management with Zithromax and Rocephin as noted above  - pneumonia order set in place  - oxygen  support via nasal canula to keep O2 saturation above 90%  Hypertension  - BP slightly on the soft side on admission but this am 167/90; restart losartan Hyponatremia  - most likely secondary to pre renal etiology  - improved with IV fluids  Hypercholesteremia  - continue statin  Type I (juvenile type) diabetes mellitus without mention of complication, uncontrolled  - with neuropathies  - A1c 13.4 on this admission indicating poor glycemic control  - had hypoglycemia this am so will hold off on Lantus Acute blood loss anemia  - microcytic, has menstrual period at this time (h/o heavy menstruations)  - FOBT negative  - will get 2 units PRBC with lasix 20 mg IV in between; so far has gotten only 1 unit of PRBC Acute renal failure  - likely pre renal etiology  - improved since admission  - follow up BMP this am Acidosis, metabolic  - secondary to sepsis as noted above  Transaminitis  - unclear etiology at this time, possibly viral  - follow up CMP this am Leukocytosis, unspecified  - secondary to UTI, PNA - ABX as noted above a DVT prophylaxis  - SCD's    Radiological Exams on Admission:  Dg Chest 2 View 03/25/2013 The lungs are adequately inflated but demonstrate increased interstitial markings diffusely suggesting mild interstitial edema versus early interstitial pneumonia. In addition confluent density in the retrocardiac region on the right is worrisome for atelectasis or early  pneumonia.    Code Status: Full  Family Communication: family not at the bedside Disposition Plan: transfer to floor today    Leisa Lenz, MD  [facility] Pager [phone]  If 7PM-7AM, please contact night-coverage [email or web address] Password [facility] 03/26/2013, 7:32 AM   LOS: 1 day   Consultants:  None   Procedures:  None   Antibiotics:  Azithromycin 03/25/2013 -->  Rocephin 03/25/2013 -->  HPI/Subjective: No overnight events.   Objective: Filed Vitals:   03/26/13 0610  03/26/13 0615 03/26/13 0630 03/26/13 0645  BP: 110/57 110/57 112/59 131/83  Pulse:  74 74 83  Temp: 97.8 F (36.6 C)     TempSrc: Oral     Resp:  16 17 15   Height:      Weight:      SpO2:  99% 99% 99%    Intake/Output Summary (Last 24 hours) at 03/26/13 0732 Last data filed at 03/26/13 0610  Gross per 24 hour  Intake 1731.42 ml  Output    300 ml  Net 1431.42 ml    Exam:  General: no acute distress CVS: RRR, S1/S2 +, no murmurs, no gallops, no carotid bruit.  Pulmonary: Effort and breath sounds normal, no stridor, rhonchi, wheezes, rales.  Abdominal: Soft. BS +, no distension, tenderness, rebound or guarding.  Musculoskeletal: Normal range of motion. LE +1 pitting edema but no tenderness. Has small erythema on left foot (4th and 5th toe, no ulceration) Neuro: Alert. Normal reflexes, muscle tone coordination.    Data Reviewed: Basic Metabolic Panel:  Recent Labs Lab 03/25/13 0732 03/25/13 1405  NA 129* 132*  K 5.1 4.8  CL 100 104  CO2 18* 19  GLUCOSE 193* 166*  BUN 34* 32*  CREATININE 1.40* 1.39*  CALCIUM 8.3* 7.4*  MG  --  1.7  PHOS  --  3.7   Liver Function Tests:  Recent Labs Lab 03/25/13 0732 03/25/13 1405  AST 41* 59*  ALT 40* 42*  ALKPHOS 236* 217*  BILITOT 0.3 0.2*  PROT 6.5 5.4*  ALBUMIN 2.5* 2.0*   No results found for this basename: LIPASE, AMYLASE,  in the last 168 hours No results found for this basename: AMMONIA,  in the last 168 hours CBC:  Recent Labs Lab 03/25/13 0732 03/25/13 0820 03/25/13 1405  WBC 16.9*  --  15.7*  NEUTROABS 15.7*  --  12.9*  HGB 5.8* 5.2* 5.1*  HCT 19.7* 17.5* 17.1*  MCV 66.1*  --  65.3*  PLT 395  --  338   Cardiac Enzymes: No results found for this basename: CKTOTAL, CKMB, CKMBINDEX, TROPONINI,  in the last 168 hours BNP: No components found with this basename: POCBNP,  CBG:  Recent Labs Lab 03/25/13 1349 03/25/13 1611 03/25/13 1820 03/25/13 2134  GLUCAP 163* 165* 219* 227*    MRSA PCR  SCREENING     Status: Abnormal   Collection Time    03/25/13 12:26 PM      Result Value Range Status   MRSA by PCR POSITIVE (*) NEGATIVE Final     Studies: Dg Chest 2 View 03/25/2013     IMPRESSION: The lungs are adequately inflated but demonstrate increased interstitial markings diffusely suggesting mild interstitial edema versus early interstitial pneumonia. In addition confluent density in the retrocardiac region on the right is worrisome for atelectasis or early pneumonia.  Follow-up films following therapy are recommended to assure clearing.   Electronically Signed   By: David  Martinique   On: 03/25/2013 08:14    Scheduled Meds: .  azithromycin  500 mg Intravenous Q24H  . bacitracin   Topical Daily  . cefTRIAXone (ROCEPHIN)  IV  1 g Intravenous Q24H  . Chlorhexidine Gluconate Cloth  6 each Topical Q0600  . feeding supplement (GLUCERNA SHAKE)  237 mL Oral BID BM  . insulin aspart  0-15 Units Subcutaneous TID WC  . insulin aspart  0-5 Units Subcutaneous QHS  . insulin glargine  13 Units Subcutaneous Daily  . insulin glargine  9 Units Subcutaneous QHS  . multivitamin with minerals  1 tablet Oral Daily  . mupirocin ointment  1 application Nasal BID  . simvastatin  10 mg Oral q1800  . sodium chloride  3 mL Intravenous Q12H   Continuous Infusions: . sodium chloride    . sodium chloride 20 mL/hr at 03/25/13 [id number]

## 2013-03-27 DIAGNOSIS — E7800 Pure hypercholesterolemia, unspecified: Secondary | ICD-10-CM

## 2013-03-27 LAB — CBC
MCH: 21.8 pg — ABNORMAL LOW (ref 26.0–34.0)
Platelets: 512 10*3/uL — ABNORMAL HIGH (ref 150–400)
RBC: 3.95 MIL/uL (ref 3.87–5.11)
RDW: 21.4 % — ABNORMAL HIGH (ref 11.5–15.5)
WBC: 9.8 10*3/uL (ref 4.0–10.5)

## 2013-03-27 LAB — GLUCOSE, CAPILLARY
Glucose-Capillary: 32 mg/dL — CL (ref 70–99)
Glucose-Capillary: 42 mg/dL — CL (ref 70–99)
Glucose-Capillary: 109 mg/dL — ABNORMAL HIGH (ref 70–99)
Glucose-Capillary: 407 mg/dL — ABNORMAL HIGH (ref 70–99)
Glucose-Capillary: 407 mg/dL — ABNORMAL HIGH (ref 70–99)

## 2013-03-27 LAB — URINE CULTURE: Colony Count: >=100000

## 2013-03-27 LAB — TYPE AND SCREEN
ABO/RH(D): AB NEG
Antibody Screen: POSITIVE
DAT, IgG: NEGATIVE
Unit division: 0

## 2013-03-27 LAB — CLOSTRIDIUM DIFFICILE BY PCR: Toxigenic C. Difficile by PCR: NEGATIVE

## 2013-03-27 LAB — BASIC METABOLIC PANEL
CO2: 20 mEq/L (ref 19–32)
Chloride: 103 mEq/L (ref 96–112)
GFR calc Af Amer: 56 mL/min — ABNORMAL LOW (ref >90)
Potassium: 4.3 mEq/L (ref 3.5–5.1)
Sodium: 135 mEq/L (ref 135–145)

## 2013-03-27 MED ORDER — GLUCOSE 40 % PO GEL: 1.0000 | ORAL | Status: DC | PRN

## 2013-03-27 MED ORDER — INSULIN GLARGINE 100 UNIT/ML ~~LOC~~ SOLN
8.0000 [IU] | Freq: Once | SUBCUTANEOUS | Status: AC
  Administered 2013-03-27: 8 [IU] via SUBCUTANEOUS
  Filled 2013-03-27 (×2): qty 0.08

## 2013-03-27 MED ORDER — INSULIN GLARGINE 100 UNIT/ML ~~LOC~~ SOLN: 16.0000 [IU] | Freq: Two times a day (BID) | SUBCUTANEOUS | Status: DC

## 2013-03-27 MED ORDER — HYDROCODONE-ACETAMINOPHEN 5-325 MG PO TABS: 2.0000 | ORAL_TABLET | Freq: Four times a day (QID) | ORAL | Status: DC | PRN

## 2013-03-27 MED ORDER — GLUCOSE 40 % PO GEL
ORAL | Status: AC
  Filled 2013-03-27: qty 1

## 2013-03-27 MED ORDER — LEVOFLOXACIN 750 MG PO TABS: 750.0000 mg | ORAL_TABLET | Freq: Every day | ORAL | Status: DC

## 2013-03-27 MED ORDER — INSULIN ASPART 100 UNIT/ML ~~LOC~~ SOLN
15.0000 [IU] | Freq: Once | SUBCUTANEOUS | Status: AC
  Administered 2013-03-27: 15 [IU] via SUBCUTANEOUS

## 2013-03-27 MED ORDER — INSULIN ASPART 100 UNIT/ML ~~LOC~~ SOLN
10.0000 [IU] | Freq: Once | SUBCUTANEOUS | Status: AC
  Administered 2013-03-27: 10 [IU] via SUBCUTANEOUS

## 2013-03-27 NOTE — Progress Notes (Signed)
Patient d/c instructions rendered,verbalized understanding.Prescriptions also given.I notified Dr. Charlies Silvers through text page about patient refusing Novolog 15 units to be administered for CBG of 407,she took 5 units,explained the importance of having the entire amount of Novolog but she refused.Sandie Ano RN

## 2013-03-27 NOTE — Progress Notes (Signed)
Patient d/c home,stable.- Nayra Coury RN 

## 2013-03-27 NOTE — Progress Notes (Signed)
Patient's CBG was 407,Dr. Charlies Silvers notified ordered 10 units of Novolog once.Sandie Ano RN

## 2013-03-27 NOTE — Progress Notes (Addendum)
Have spoken at length with patient regarding her usual regimen at home for diabetes control.  Her endocrinologist is Dr Dwyane Dee and she regularly is consulted by the dietician in the office.  Pt's next appt with Dr Dwyane Dee is for January 7th. Pt states she is going and looks forward to it. I am a bit perplexed by the hypoglycemia this am since yesterday since she received on the am dose of lantus and no lantus last Hs. Pt states that while she has been here, she has been over-treated when she has hypoglycemia which in turn she then results in  hyperglycemia (since being here).   Pt has not received her am dose of Lantus today (and got no HS lantus last night), I have paged Dr Rogers Blocker to order her am dose before discharge this afternoon.  Pt is able to tell me her exact ratios for correction doses and for meal coverage.   She states she takes Correction Novolog 3 times per day: 1 unit for every 25 mg/dL over her goal of 150 mg/dL  She states her meal coverage as Novolog 1 unit per 15 grams carbohydrate and does not take that until after she eats since she isn't always able to eat what she thinks she will.  Pt states that on the day of admission, her blood glucose was in the 200's and she felt very sick and thought she may be hypoglycemic.  Howeveer, she wasn't low and still felt so sick she could barely walk.  Stated her temperature rose to 102 degrees when she was taken to the hospital. (Maybe due to the UTI)?  States she was having to go to the bathroom over and over all night before admission.+ Pt very concerned about the set doses of insulin she has been getting here, however, the scale is pretty much in line with what she does at home. She is not getting meal coverage while here, so her glucose may run a bit high since she does add that at home. Pt very knowledgeable about her regimen.  Pt appears somewhat high strung, but she states she has been through so much at home with losing her son, her job at  the time and having no family to lean on who lives here.  As far  As diabetes is concerned, pt expresses thorough knowledge of her regimen and appears to be very capable of handling her glucose.  If the UTI was a factor, it is understandable that her glucose would be so high. Do not think she needs a dietician consult. Please explain to her that a UTI can cause the glucose to elevate tremendously.(per exit care notes)  Pt seems to need an ear, a shoulder to lean on. Appears that this would be the best therapy addition at this time. Thank you, Rosita Kea, RN, CNS, Diabetes Coordinator [phone])  Pt has not received lantus thus far today nor last HS. Requested RN to go ahead and give the lantus as there is no order to hold it either last night nor today.  Dr Rogers Blocker has not returned the page at this time. RN to clarify with Dr Rogers Blocker.  CBG now at 426 mg/dL per RN.  Page Dr Rogers Blocker for orders: Orders received and entered. One dose of Lantus 8 units now and one dose of 15 units Novolog now.  Again, the HgbA1C would not be accurate due to the extremely low Hgb level on admission.  Would not be accurate now either since she has had  a transfusion.

## 2013-03-27 NOTE — Progress Notes (Signed)
Patient's CBG up to 109.Sandie Ano RN

## 2013-03-27 NOTE — Progress Notes (Signed)
Patient CBG  At 737 was 32, orange juice given per protocol. At 0803 CBG cae up to 42. Patien is alert and orientedx4. She said she was thirsty, water offered. She is eating breakfast at this time. Dr. Charlies Silvers notified. Will continue to monitor her blood sugar.- Sandie Ano RN

## 2013-03-27 NOTE — Discharge Summary (Signed)
Physician Discharge Summary  [name] [id number] DOB: 03/19/1971 DOA: 03/25/2013  PCP: Luetta Nutting, DO  Admit date: 03/25/2013 Discharge date: 03/27/2013  Recommendations for Outpatient Follow-up:  1. Please continue Levaquin for 5 more days on discharge for urinary tract infection. Your urine culture grew E.Coli  2. Please follow up with PCP per scheduled appointment as well as with wound care as scheduled  Discharge instructions  As directed   Comments:    1. Please note that we have changed Lantus to 8 units in the morning and 8 units at bedtime. Please follow up with your PCP/endocrinology to make sure this regimen works out for you. Hopefully it will reduce fluctuations in CBG's we had seen while you were in hospital. 2. Take Levaquin for UTI as prescribed.   Discharge Diagnoses:  Principal Problem:   Sepsis Active Problems:   UTI (urinary tract infection)   CAP (community acquired pneumonia)   Neuropathy in diabetes   Hypertension   Hypercholesteremia   Iron deficiency anemia, unspecified   Type I (juvenile type) diabetes mellitus without mention of complication, uncontrolled   Acute blood loss anemia   Acute renal failure   Acidosis   Leukocytosis, unspecified   DVT prophylaxis    Discharge Condition: medically stable for discharge home today   Diet recommendation: as tolerated, diabetic diet   History of present illness:  Pt is 42 yo female with past medical history of diabetes, diabetic neuropathy, dyslipidemia, hypertension who presented to [facility] ED (from [facility]) due to high blood sugars. Pt reported that at 11 pm the night prior to this admission she checked her CBG and it read HH. She gave herself extra dose of Lantus specifically 4 units of Lantus extra (normally it was supposed to be 13 in am and 9 at bedtime which was recently changed to 16 in am and 5 at bedtime). At 3 am she started to feel muscle cramps, jittery, shaking and with constant  urination. She checked her CBG and it was then low. Since it was fluctuating, she decided to go to ED for evaluation.  Her BP on the admission was 89/65 but it has improved to 129/61. IV fluids stopped once pt arrived to SDU due to LE edema. In addition, her CBC revealed Hgb of 5.8. Creatinine was 1.4 on the admission.   Assessment and Plan:   Principal Problem:  Sepsis secondary to UTI and CAP  - secondary to UTI and/or pneumonia; BP 89/65 on the admission, HR of 114 and RR of 26 in addition to T max of 102.1 F with evidence of UTI and pneumonia of UA and CXR respectively. This satisfied criteria of sepsis, In addition, procalcitonin was elevated at 5.52. Lactic acid was WNL  - UA showed large leukocytes and urine culture showed E.Coli. CXR had increased interstitial markings concerning for edema or pneumonia  - given one dose of IV lasix 20 mg after 1st unit of PRBC  - Final blood culture results - no gorwth - strep pneumo negative; legionella negative, Influenza negative  - continue azithromycin and rocephin in hospital, Levaquin on discharge  Active Problems:  UTI (urinary tract infection), E.Coli - Urine culture results - E.Coli - Continue rocephin 1 gm daily IV in hospital; Levaquin on discharge  CAP (community acquired pneumonia)  - management as above with Levaquin on discharge - pneumonia order set was ordered on admission Hypertension  - continue losartan  Hyponatremia  - most likely secondary to pre renal etiology  -  improved with IV fluids  Hypercholesteremia  - continue statin  Type I (juvenile type) diabetes mellitus without mention of complication, uncontrolled  - with neuropathies  - A1c 13.4 on this admission indicating poor glycemic control  - please see above the recommendation and adjustment of insulin regimen Acute blood loss anemia  - microcytic, has menstrual period at this time (h/o heavy menstruations)  - FOBT negative  - got 2 units PRBC with lasix 20 mg IV  in between Acute renal failure  - likely pre renal etiology  - improved since admission  Acidosis, metabolic  - secondary to sepsis as noted above  Transaminitis  - unclear etiology at this time, possibly viral  Leukocytosis, unspecified  - secondary to UTI, PNA - ABX as noted above  DVT prophylaxis  - SCD's    Radiological Exams on Admission:  Dg Chest 2 View 03/25/2013 The lungs are adequately inflated but demonstrate increased interstitial markings diffusely suggesting mild interstitial edema versus early interstitial pneumonia. In addition confluent density in the retrocardiac region on the right is worrisome for atelectasis or early pneumonia.   Code Status: Full  Family Communication: family not at the bedside    Consultants:  None  Procedures:  None  Antibiotics:  Azithromycin 03/25/2013 --> 03/27/2013 Rocephin 03/25/2013 --> 03/27/2013 Levaquin on discharge as prescribed   Signed:  Leisa Lenz, MD  [facility] 03/27/2013, 1:44 PM  Pager #: [phone]   Discharge Exam: Filed Vitals:   03/27/13 0428  BP: 131/66  Pulse: 89  Temp: 98.9 F (37.2 C)  Resp: 18   Filed Vitals:   03/26/13 1129 03/26/13 1353 03/26/13 2109 03/27/13 0428  BP: 168/83 169/88 159/83 131/66  Pulse: 86 89 92 89  Temp:  99 F (37.2 C) 99.8 F (37.7 C) 98.9 F (37.2 C)  TempSrc:  Oral Oral Axillary  Resp: 17 18 18 18   Height:  5\' 7"  (1.702 m)    Weight:    64.275 kg (141 lb 11.2 oz)  SpO2: 98% 98% 96% 96%    General: Pt is alert, follows commands appropriately, not in acute distress Cardiovascular: Regular rate and rhythm, S1/S2 +, no murmurs, no rubs, no gallops Respiratory: Clear to auscultation bilaterally, no wheezing, no crackles, no rhonchi Abdominal: Soft, non tender, non distended, bowel sounds +, no guarding Extremities: no edema, no cyanosis, pulses palpable bilaterally DP and PT Neuro: Grossly nonfocal  Discharge Instructions  Discharge Orders    Future Appointments Provider Department Dept Phone   04/08/2013 9:00 AM [facility] [phone]   04/10/2013 9:00 AM Elayne Snare, MD [facility] [phone]   Future Orders Complete By Expires   Call MD for:  difficulty breathing, headache or visual disturbances  As directed    Call MD for:  difficulty breathing, headache or visual disturbances  As directed    Call MD for:  persistant dizziness or light-headedness  As directed    Call MD for:  persistant dizziness or light-headedness  As directed    Call MD for:  persistant nausea and vomiting  As directed    Call MD for:  persistant nausea and vomiting  As directed    Call MD for:  severe uncontrolled pain  As directed    Call MD for:  severe uncontrolled pain  As directed    Diet - low sodium heart healthy  As directed    Diet - low sodium heart healthy  As directed    Discharge  instructions  As directed    Comments:     1. Please continue Levaquin for 5 more days on discharge for urinary tract infection. Your urine culture grew E.Coli  2. Please follow up with PCP per scheduled appointment as well as with wound care as scheduled   Discharge instructions  As directed    Comments:     1. Please note that we have changed Lantus to 8 units in the morning and 8 units at bedtime. Please follow up with your PCP/endocrinology to make sure this regimen works out for you. Hopefully it will reduce fluctuations in CBG's we had seen while you were in hospital. 2. Take Levaquin for UTI as prescribed.   Increase activity slowly  As directed    Increase activity slowly  As directed        Medication List         albuterol 108 (90 BASE) MCG/ACT inhaler  Commonly known as:  PROVENTIL HFA;VENTOLIN HFA  Inhale 2 puffs into the lungs every 6 (six) hours as needed. S.O.B.     aspirin 325 MG tablet  Take 325 mg by mouth daily.     HYDROcodone-acetaminophen 5-325 MG per tablet  Commonly  known as:  NORCO/VICODIN  Take 2 tablets by mouth every 6 (six) hours as needed.     insulin glargine 100 UNIT/ML injection  Commonly known as:  LANTUS  Inject 0.16 mLs (16 Units total) into the skin 2 (two) times daily. Take 8 units in am and 8 units at night     insulin lispro 100 UNIT/ML injection  Commonly known as:  HUMALOG  Inject 2-7 Units into the skin 3 (three) times daily before meals. Pt on sliding scale     levofloxacin 750 MG tablet  Commonly known as:  LEVAQUIN  Take 1 tablet (750 mg total) by mouth daily.     losartan 25 MG tablet  Commonly known as:  COZAAR  Take 1 tablet (25 mg total) by mouth daily.     pravastatin 20 MG tablet  Commonly known as:  PRAVACHOL  Take 1 tablet (20 mg total) by mouth daily.           Follow-up Information   Follow up with Luetta Nutting, DO. Schedule an appointment as soon as possible for a visit in 3 days.   Specialty:  Family Medicine   Contact information:   [address] [place] [address]        The results of significant diagnostics from this hospitalization (including imaging, microbiology, ancillary and laboratory) are listed below for reference.    Significant Diagnostic Studies: Dg Chest 2 View  03/25/2013   CLINICAL DATA:  Fever and chills and nausea and vomiting.  EXAM: CHEST  2 VIEW  COMPARISON:  Portable chest x-ray of June 02, 2011  FINDINGS: The lungs are adequately inflated. The interstitial markings are increased bilaterally when compared to the previous study but they are not entirely new. Coarse retrocardiac lung markings are visible on the lateral film. The cardiopericardial silhouette is not enlarged. The pulmonary vascularity is prominent centrally. There is no pleural effusion. There is no pneumothorax. The observed portions of the bony thorax appear normal.  IMPRESSION: The lungs are adequately inflated but demonstrate increased interstitial markings diffusely suggesting  mild interstitial edema versus early interstitial pneumonia. In addition confluent density in the retrocardiac region on the right is worrisome for atelectasis or early pneumonia.  Follow-up films following therapy are recommended  to assure clearing.   Electronically Signed   By: David  Martinique   On: 03/25/2013 08:14   Dg Abd Portable 1v  03/26/2013   CLINICAL DATA:  Diarrhea and abdominal distention  EXAM: PORTABLE ABDOMEN - 1 VIEW  COMPARISON:  May 29, 2005  FINDINGS: There is moderate stool throughout the colon. The bowel gas pattern is unremarkable. No obstruction or free air is seen on this supine examination. There is arterial vascular calcification in the pelvis bilaterally.  IMPRESSION: Moderate stool. Bowel gas pattern unremarkable. Atherosclerotic calcification.   Electronically Signed   By: Lowella Grip M.D.   On: 03/26/2013 16:52    Microbiology: Recent Results (from the past 240 hour(s))  CULTURE, BLOOD (ROUTINE X 2)     Status: None   Collection Time    03/25/13  7:40 AM      Result Value Range Status   Specimen Description BLOOD  RT FA   Final   Special Requests     Final   Value: Immunocompromised BOTTLES DRAWN AEROBIC AND ANAEROBIC  5CC   Culture  Setup Time     Final   Value: 03/25/2013 14:20     Performed at [facility]   Culture     Final   Value:        BLOOD CULTURE RECEIVED NO GROWTH TO DATE CULTURE WILL BE HELD FOR 5 DAYS BEFORE ISSUING A FINAL NEGATIVE REPORT     Performed at [facility]   Report Status PENDING   Incomplete  CULTURE, BLOOD (ROUTINE X 2)     Status: None   Collection Time    03/25/13  7:45 AM      Result Value Range Status   Specimen Description BLOOD  RT. HAND   Final   Special Requests     Final   Value: Immunocompromised BOTTLES DRAWN AEROBIC AND ANAEROBIC 5CC   Culture  Setup Time     Final   Value: 03/25/2013 14:20     Performed at [facility]   Culture     Final   Value:        BLOOD CULTURE RECEIVED  NO GROWTH TO DATE CULTURE WILL BE HELD FOR 5 DAYS BEFORE ISSUING A FINAL NEGATIVE REPORT     Performed at [facility]   Report Status PENDING   Incomplete  URINE CULTURE     Status: None   Collection Time    03/25/13  8:56 AM      Result Value Range Status   Specimen Description URINE, CLEAN CATCH   Final   Special Requests NONE   Final   Culture  Setup Time     Final   Value: 03/25/2013 14:31     Performed at [facility]     Final   Value: >=100,000 COLONIES/ML     Performed at [facility]   Culture     Final   Value: ESCHERICHIA COLI     Performed at [facility]   Report Status 03/27/2013 FINAL   Final   Organism ID, Bacteria ESCHERICHIA COLI   Final  MRSA PCR SCREENING     Status: Abnormal   Collection Time    03/25/13 12:26 PM      Result Value Range Status   MRSA by PCR POSITIVE (*) NEGATIVE Final   Comment:            The GeneXpert MRSA Assay (FDA  approved for NASAL specimens     only), is one component of a     comprehensive MRSA colonization     surveillance program. It is not     intended to diagnose MRSA     infection nor to guide or     monitor treatment for     MRSA infections.     RESULT CALLED TO, READ BACK BY AND VERIFIED WITH:     WESTON,D/2W @2308  ON 03/25/13 BY KARCZEWSKI,S.  URINE CULTURE     Status: None   Collection Time    03/25/13  3:00 PM      Result Value Range Status   Specimen Description URINE, CLEAN CATCH   Final   Special Requests NONE   Final   Culture  Setup Time     Final   Value: 03/25/2013 21:10     Performed at [facility]     Final   Value: 6,000 COLONIES/ML     Performed at [facility]   Culture     Final   Value: INSIGNIFICANT GROWTH     Performed at [facility]   Report Status 03/26/2013 FINAL   Final  CLOSTRIDIUM DIFFICILE BY PCR     Status: None   Collection Time    03/26/13  9:03 PM      Result Value Range Status   C  difficile by pcr NEGATIVE  NEGATIVE Final   Comment: Performed at [facility]: Basic Metabolic Panel:  Recent Labs Lab 03/25/13 0732 03/25/13 1405 03/26/13 1015 03/27/13 0828  NA 129* 132* 130* 135  K 5.1 4.8 5.5* 4.3  CL 100 104 101 103  CO2 18* 19 17* 20  GLUCOSE 193* 166* 283* 74  BUN 34* 32* 33* 27*  CREATININE 1.40* 1.39* 1.39* 1.35*  CALCIUM 8.3* 7.4* 8.2* 8.7  MG  --  1.7  --   --   PHOS  --  3.7  --   --    Liver Function Tests:  Recent Labs Lab 03/25/13 0732 03/25/13 1405 03/26/13 1015  AST 41* 59* 51*  ALT 40* 42* 47*  ALKPHOS 236* 217* 272*  BILITOT 0.3 0.2* 0.4  PROT 6.5 5.4* 6.5  ALBUMIN 2.5* 2.0* 2.3*   No results found for this basename: LIPASE, AMYLASE,  in the last 168 hours No results found for this basename: AMMONIA,  in the last 168 hours CBC:  Recent Labs Lab 03/25/13 0732 03/25/13 0820 03/25/13 1405 03/26/13 1015 03/27/13 0828  WBC 16.9*  --  15.7* 15.9* 9.8  NEUTROABS 15.7*  --  12.9*  --   --   HGB 5.8* 5.2* 5.1* 9.1* 8.6*  HCT 19.7* 17.5* 17.1* 29.1* 27.7*  MCV 66.1*  --  65.3* 70.8* 70.1*  PLT 395  --  338 366 512*   Cardiac Enzymes: No results found for this basename: CKTOTAL, CKMB, CKMBINDEX, TROPONINI,  in the last 168 hours BNP: BNP (last 3 results) No results found for this basename: PROBNP,  in the last 8760 hours CBG:  Recent Labs Lab 03/26/13 2128 03/27/13 0737 03/27/13 0803 03/27/13 0859 03/27/13 1139  GLUCAP 116* 32* 42* 109* 407*    Time coordinating discharge: Over 30 minutes

## 2013-03-27 NOTE — Progress Notes (Signed)
PT Cancellation Note  Patient Details Name: [name] MRN: [id number] DOB: [birth year]   Cancelled Treatment:    Reason Eval/Treat Not Completed: PT screened, no needs identified, will sign off   Claretha Cooper 03/27/2013, 2:17 PM Tresa Endo PT [phone]

## 2013-03-27 NOTE — Progress Notes (Signed)
Inpatient Diabetes Program Recommendations  AACE/ADA: New Consensus Statement on Inpatient Glycemic Control (2013)  Target Ranges:  Prepandial:   less than 140 mg/dL      Peak postprandial:   less than 180 mg/dL (1-2 hours)      Critically ill patients:  140 - 180 mg/dL   Referral received regarding "poor glycemic control".  HgbA1C result of 13.5%, however her Hgb was 5.8 which is extremely low and cannot reflect an accurate HgbA1C.  Results for Nancy, [name] (MRN [id number]) as of 03/27/2013 10:28  Ref. Range 03/26/2013 17:07 03/26/2013 21:03 03/26/2013 21:28 03/27/2013 04:20 03/27/2013 07:37 03/27/2013 08:03 03/27/2013 08:28 03/27/2013 08:59  Glucose-Capillary Latest Range: 70-99 mg/dL 160 (H)  116 (H)  32 (LL) 42 (LL)  109 (H)    Noted fluctuation of cbg's: hyoglycemia in am followed by hyperglycemia in early afternoon.  Hyerglycemia then results in high doses of correction. Reviewing CBG pattern, it appears that the lantus dose she has been on is too high reflected by the hypoglycemia in early am times 2 days. Recommend the lantus dose be lowered a total daily dose of 16 units (either 8 am and 8 HS or 16 units at HS.) The primary reason for hyperglycemia mid-day appears to be a result of over-correcting the fasting hypoglycemia resulting in higher glucose without correction at that time. Primary need appears at this time to be too high a dose of daily lantus. Again, please decrease lantus to total of 18 units per day rather than 23 units per day. Pt would benefit from decrease in lantus as home as well, as her admission hx states she was high mid - afternoon after being low in the am and treating with lantus to correct. Will discuss with the patient how to correct if needed and that she may suggest a lower dose lantus at home as well. Thank you, Rosita Kea, RN, CNS, Diabetes Coordinator [phone])

## 2013-03-27 NOTE — Progress Notes (Signed)
CSW received consult for patient currently trying to obtain disability for long term type 1 diabetes. CSW unable to assist with disability..No further Clinical Social Work needs, signing off.    Dorathy Kinsman, LCSW [phone]  ED CSW .03/27/2013 covering csw 814am

## 2013-03-30 LAB — GLUCOSE, CAPILLARY: Glucose-Capillary: 43 mg/dL — CL (ref 70–99)

## 2013-03-31 LAB — CULTURE, BLOOD (ROUTINE X 2)

## 2013-04-08 ENCOUNTER — Other Ambulatory Visit: Payer: Medicaid Other

## 2013-04-08 ENCOUNTER — Other Ambulatory Visit (INDEPENDENT_AMBULATORY_CARE_PROVIDER_SITE_OTHER): Payer: Medicaid Other

## 2013-04-08 DIAGNOSIS — IMO0002 Reserved for concepts with insufficient information to code with codable children: Secondary | ICD-10-CM

## 2013-04-08 DIAGNOSIS — E1065 Type 1 diabetes mellitus with hyperglycemia: Secondary | ICD-10-CM

## 2013-04-08 LAB — LIPID PANEL
Cholesterol: 206 mg/dL — ABNORMAL HIGH (ref 0–200)
HDL: 32.8 mg/dL — AB (ref >39.00)
TRIGLYCERIDES: 270 mg/dL — AB (ref 0.0–149.0)
Total CHOL/HDL Ratio: 6
VLDL: 54 mg/dL — ABNORMAL HIGH (ref 0.0–40.0)

## 2013-04-08 LAB — URINALYSIS, ROUTINE W REFLEX MICROSCOPIC
BILIRUBIN URINE: NEGATIVE
Ketones, ur: NEGATIVE
LEUKOCYTES UA: NEGATIVE
Nitrite: NEGATIVE
Specific Gravity, Urine: 1.02 (ref 1.000–1.030)
Total Protein, Urine: 100 — AB
Urine Glucose: >=1000 — AB
Urobilinogen, UA: 0.2 (ref 0.0–1.0)
pH: 6 (ref 5.0–8.0)

## 2013-04-08 LAB — COMPREHENSIVE METABOLIC PANEL
ALBUMIN: 2.8 g/dL — AB (ref 3.5–5.2)
ALT: 29 U/L (ref 0–35)
AST: 22 U/L (ref 0–37)
Alkaline Phosphatase: 159 U/L — ABNORMAL HIGH (ref 39–117)
BUN: 33 mg/dL — AB (ref 6–23)
CALCIUM: 8.3 mg/dL — AB (ref 8.4–10.5)
CHLORIDE: 104 meq/L (ref 96–112)
CO2: 23 mEq/L (ref 19–32)
Creatinine, Ser: 1.3 mg/dL — ABNORMAL HIGH (ref 0.4–1.2)
GFR: 47.88 mL/min — ABNORMAL LOW (ref >60.00)
GLUCOSE: 421 mg/dL — AB (ref 70–99)
POTASSIUM: 5.8 meq/L — AB (ref 3.5–5.1)
SODIUM: 133 meq/L — AB (ref 135–145)
Total Bilirubin: 0.4 mg/dL (ref 0.3–1.2)
Total Protein: 6.5 g/dL (ref 6.0–8.3)

## 2013-04-08 LAB — LDL CHOLESTEROL, DIRECT: Direct LDL: 113.6 mg/dL

## 2013-04-08 LAB — MICROALBUMIN / CREATININE URINE RATIO
Creatinine,U: 34.2 mg/dL
MICROALB UR: 108.7 mg/dL — AB (ref 0.0–1.9)
Microalb Creat Ratio: 317.7 mg/g — ABNORMAL HIGH (ref 0.0–30.0)

## 2013-04-08 LAB — HEMOGLOBIN A1C: Hgb A1c MFr Bld: 11.3 % — ABNORMAL HIGH (ref 4.6–6.5)

## 2013-04-09 ENCOUNTER — Encounter (HOSPITAL_BASED_OUTPATIENT_CLINIC_OR_DEPARTMENT_OTHER): Payer: Medicaid Other | Attending: Internal Medicine

## 2013-04-09 DIAGNOSIS — E1169 Type 2 diabetes mellitus with other specified complication: Secondary | ICD-10-CM | POA: Insufficient documentation

## 2013-04-09 DIAGNOSIS — L97509 Non-pressure chronic ulcer of other part of unspecified foot with unspecified severity: Secondary | ICD-10-CM | POA: Insufficient documentation

## 2013-04-10 ENCOUNTER — Ambulatory Visit: Payer: Medicaid Other | Admitting: Endocrinology

## 2013-04-22 ENCOUNTER — Ambulatory Visit: Payer: Medicaid Other | Admitting: Endocrinology

## 2013-04-24 ENCOUNTER — Ambulatory Visit (INDEPENDENT_AMBULATORY_CARE_PROVIDER_SITE_OTHER): Payer: Medicaid Other | Admitting: Endocrinology

## 2013-04-24 ENCOUNTER — Encounter: Payer: Self-pay | Admitting: Endocrinology

## 2013-04-24 VITALS — BP 120/70 | HR 83 | Temp 98.7°F | Resp 12 | Wt 134.7 lb

## 2013-04-24 DIAGNOSIS — E13621 Other specified diabetes mellitus with foot ulcer: Secondary | ICD-10-CM

## 2013-04-24 DIAGNOSIS — E1121 Type 2 diabetes mellitus with diabetic nephropathy: Secondary | ICD-10-CM

## 2013-04-24 DIAGNOSIS — L97509 Non-pressure chronic ulcer of other part of unspecified foot with unspecified severity: Secondary | ICD-10-CM

## 2013-04-24 DIAGNOSIS — E1369 Other specified diabetes mellitus with other specified complication: Secondary | ICD-10-CM

## 2013-04-24 DIAGNOSIS — E1129 Type 2 diabetes mellitus with other diabetic kidney complication: Secondary | ICD-10-CM

## 2013-04-24 DIAGNOSIS — IMO0002 Reserved for concepts with insufficient information to code with codable children: Secondary | ICD-10-CM

## 2013-04-24 DIAGNOSIS — N049 Nephrotic syndrome with unspecified morphologic changes: Secondary | ICD-10-CM

## 2013-04-24 DIAGNOSIS — E1065 Type 1 diabetes mellitus with hyperglycemia: Secondary | ICD-10-CM

## 2013-04-24 MED ORDER — FUROSEMIDE 40 MG PO TABS: 40.0000 mg | ORAL_TABLET | Freq: Every day | ORAL | Status: DC

## 2013-04-24 NOTE — Patient Instructions (Addendum)
Take 18 Lantus in am daily and if sugars at supper still high go up 1 unit every 3 days  Take PM Lantus as soon as possible when coming home in pm, take 6-10 units at least berore 11 pm  Start Lasix 40mg ; 1/2 daily; if still swelling take 1 tab after 1 week

## 2013-04-24 NOTE — Progress Notes (Signed)
Patient ID: [name], female   DOB: [birth year], 43 y.o.   MRN: [id number]   Reason for Appointment: Diabetes follow-up   History of Present Illness   Diagnosis: Type 1 DIABETES MELITUS  Previous history: She has had long-standing poorly controlled diabetes and typically has poor compliance with self care measures despite periodic diabetes education and periodic followup  Insulin regimen: Lantus 16 units a.m., 5-9 units p.m (10 pm - 2 am), Humalog 1:15g and 2:100 correction        Recent history:  She is again having significant hyperglycemia from not taking enough insulin overall However she is doing a little better with taking some Lantus in the evening now; also taking 3 more units of Lantus in the morning as directed Her A1c is slightly better Blood sugars patterns: She had the highest readings late in the evening and these are consistently high with averages of at least 360. Also has mostly high readings overnight with average of 375. Lowest readings are generally midday and early afternoon but with significant variability, only once as a glucose of 38 early morning. Difficult to analyze her readings by time of day since her meter is about 2 hours fast  Problems identified:    She is irregular with the Lantus in the evening and will take it usually very late at night when they sugar has gone up  She thinks that if she takes Humalog and Lantus together in the evening she will get severely hypoglycemic  She does not take her Humalog when she is eating during the day when she is working since she is eating mostly small snacks and does not get a time to take insulin or test her glucose  She is eating out most of the time and frequently high fat meals  She will snack and have drinks with sugar without any Humalog coverage  She is trying to take her Humalog based on carbohydrate intake but blood sugars appear to be higher later in the evening. Not clear how often and how much she  is actually taking  Proper timing of medications in relation to meals:  sometimes       Monitors blood glucose: Once a day.    Glucometer:  Accu-Chek [employer]    Blood Glucose readings from meter download:   PREMEAL Breakfast Lunch Dinner Bedtime Overall  Glucose range:  38-600+   68-557   366-518   271-600+    Mean/median:      316     Hypoglycemia frequency:  only once on 04/08/13 at 6:30 AM  Meals: 1 meal per day in the evening usually ,  eating at variable times and also having snacks throughout the day .          Physical activity: exercise: Unable to do much           Dietician visit: Most recent:?          Complications: are: Neuropathy, nephropathy, diabetic foot ulcer     Wt Readings from Last 3 Encounters:  04/24/13 134 lb 11.2 oz (61.1 kg)  03/27/13 141 lb 11.2 oz (64.275 kg)  02/06/13 132 lb 6.4 oz (60.056 kg)   Lab Results  Component Value Date   HGBA1C 11.3* 04/08/2013   HGBA1C 13.5* 03/25/2013   HGBA1C 13.3* 12/24/2012   Lab Results  Component Value Date   MICROALBUR 108.7* 04/08/2013   LDLCALC 105* 11/16/2010   CREATININE 1.3* 04/08/2013        Medication List  This list is accurate as of: 04/24/13  8:52 AM.  Always use your most recent med list.               albuterol 108 (90 BASE) MCG/ACT inhaler  Commonly known as:  PROVENTIL HFA;VENTOLIN HFA  Inhale 2 puffs into the lungs every 6 (six) hours as needed. S.O.B.     aspirin 325 MG tablet  Take 325 mg by mouth daily.     glucose blood test strip  Use as instructed     insulin aspart 100 UNIT/ML injection  Commonly known as:  novoLOG  Inject 1 Units into the skin 15 (fifteen) minutes before meals.     insulin glargine 100 UNIT/ML injection  Commonly known as:  LANTUS  Inject 16 Units into the skin 2 (two) times daily. Take 16 units in am and 5 units at night     insulin lispro 100 UNIT/ML injection  Commonly known as:  HUMALOG  Inject 2-7 Units into the skin 3 (three) times daily before  meals. Pt on sliding scale     levofloxacin 750 MG tablet  Commonly known as:  LEVAQUIN  Take 1 tablet (750 mg total) by mouth daily.     Misc. Devices Misc  Glucose blood test strip. Blood sugar to be tested 5 times daily.        Allergies:  Allergies  Allergen Reactions  . Ciprofloxacin Itching  . Cleocin [Clindamycin Hcl]     Diarrhea     Past Medical History  Diagnosis Date  . Diabetes mellitus   . Neuropathy in diabetes   . Hypertension   . Hypercholesteremia   . H/O seasonal allergies   . Anemia   . Left foot infection     Past Surgical History  Procedure Laterality Date  . Cesarean section    . Eye surgery    . Hemrrhoidectomy    . Peripherally inserted central catheter insertion      No family history on file.  Social History:  reports that she quit smoking about 4 years ago. She does not have any smokeless tobacco history on file. She reports that she drinks about 0.5 ounces of alcohol per week. She reports that she does not use illicit drugs.  Review of Systems:  She is now having some swelling of her feet. She has not been going back to her PCP Her lisinopril was stopped because of hyperkalemia  Hypertension:  she previously has been on amlodipine. Blood pressure is higher than usual today She was on low-dose lisinopril, possibly had hyperkalemia with this and is not on any medications now  Lipids: Has not been taking her pravastatin recently despite reminders and LDL is still high      Examination:   BP 120/70  Pulse 83  Temp(Src) 98.7 F (37.1 C)  Resp 12  Wt 134 lb 11.2 oz (61.1 kg)  SpO2 98%  LMP 04/21/2013  Body mass index is 21.09 kg/(m^2).   2+ pedal edema Left foot still has ulcer on the toe  ASSESSMENT/ PLAN::   Diabetes type 1 with poor control and multiple complications    Blood glucose is again significantly high  with the Home average blood sugar over 300 See history of present illness for detailed analysis of her blood  sugar patterns and day-to-day management Discussed with the patient the problems identified above Discussed that she needs more basal insulin both during the day and night Discussed timing of Lantus insulin to be taken  earlier in the evening to keep her blood sugar from rising overnight Also difficult to be sure if she is taking her Humalog before evening meals; previously had been taking this only when blood sugar go up She probably will do better on an insulin pump assisting at bolusing during the day for snacks and also we'll be able to identify her day-to-day regimen and compliance better; will also be getting more consistent basal insulin if she is on the pump  PLAN:   Take 18 Lantus in am daily and if sugars at supper still high go up 1 unit every 3 days Take PM Lantus as soon as possible when coming home in pm, take 6-10 units at least berore 11 pm If the blood sugars after her evening meal are higher to increase suppertime dosage  Will give her a trial of the pump when she gets it  Hyperlipidemia: She needs to resume pravastatin and have repeat levels done on next visit.   HYPERTENSION with diabetic nephropathy: Blood pressure is not high today  Edema: This is likely to be from nephrotic syndrome and will start her on Lasix 20-40 mg daily, she will adjust the dose based on the response  Counseling time over 50% of today's 25 minute visit . Candise Crabtree 04/24/2013, 8:52 AM   No visits with results within 1 Week(s) from this visit. Latest known visit with results is:  Appointment on 04/08/2013  Component Date Value Range Status  . Hemoglobin A1C 04/08/2013 11.3* 4.6 - 6.5 % Final   Glycemic Control Guidelines for People with Diabetes:Non Diabetic:  <6%Goal of Therapy: <7%Additional Action Suggested:  >8%   . Sodium 04/08/2013 133* 135 - 145 mEq/L Final  . Potassium 04/08/2013 5.8* 3.5 - 5.1 mEq/L Final  . Chloride 04/08/2013 104  96 - 112 mEq/L Final  . CO2 04/08/2013 23  19 -  32 mEq/L Final  . Glucose, Bld 04/08/2013 421* 70 - 99 mg/dL Final  . BUN 04/08/2013 33* 6 - 23 mg/dL Final  . Creatinine, Ser 04/08/2013 1.3* 0.4 - 1.2 mg/dL Final  . Total Bilirubin 04/08/2013 0.4  0.3 - 1.2 mg/dL Final  . Alkaline Phosphatase 04/08/2013 159* 39 - 117 U/L Final  . AST 04/08/2013 22  0 - 37 U/L Final  . ALT 04/08/2013 29  0 - 35 U/L Final  . Total Protein 04/08/2013 6.5  6.0 - 8.3 g/dL Final  . Albumin 04/08/2013 2.8* 3.5 - 5.2 g/dL Final  . Calcium 04/08/2013 8.3* 8.4 - 10.5 mg/dL Final  . GFR 04/08/2013 47.88* >60.00 mL/min Final  . Microalb, Ur 04/08/2013 108.7* 0.0 - 1.9 mg/dL Final  . Creatinine,U 04/08/2013 34.2   Final  . Microalb Creat Ratio 04/08/2013 317.7* 0.0 - 30.0 mg/g Final  . Cholesterol 04/08/2013 206* 0 - 200 mg/dL Final   ATP III Classification       Desirable:  < 200 mg/dL               Borderline High:  200 - 239 mg/dL          High:  > = 240 mg/dL  . Triglycerides 04/08/2013 270.0* 0.0 - 149.0 mg/dL Final   Normal:  <150 mg/dLBorderline High:  150 - 199 mg/dL  . HDL 04/08/2013 32.80* >39.00 mg/dL Final  . VLDL 04/08/2013 54.0* 0.0 - 40.0 mg/dL Final  . Total CHOL/HDL Ratio 04/08/2013 6   Final  Men          Women1/2 Average Risk     3.4          3.3Average Risk          5.0          4.42X Average Risk          9.6          7.13X Average Risk          15.0          11.0                      . Color, Urine 04/08/2013 YELLOW  Yellow;Lt. Yellow Final  . APPearance 04/08/2013 CLEAR  Clear Final  . Specific Gravity, Urine 04/08/2013 1.020  1.000-1.030 Final  . pH 04/08/2013 6.0  5.0 - 8.0 Final  . Total Protein, Urine 04/08/2013 100* Negative Final  . Urine Glucose 04/08/2013 >=1000* Negative Final  . Ketones, ur 04/08/2013 NEGATIVE  Negative Final  . Bilirubin Urine 04/08/2013 NEGATIVE  Negative Final  . Hgb urine dipstick 04/08/2013 SMALL* Negative Final  . Urobilinogen, UA 04/08/2013 0.2  0.0 - 1.0 Final  . Leukocytes, UA  04/08/2013 NEGATIVE  Negative Final  . Nitrite 04/08/2013 NEGATIVE  Negative Final  . WBC, UA 04/08/2013 0-2/hpf  0-2/hpf Final  . RBC / HPF 04/08/2013 0-2/hpf  0-2/hpf Final  . Squamous Epithelial / LPF 04/08/2013 Rare(0-4/hpf)  Rare(0-4/hpf) Final  . Direct LDL 04/08/2013 113.6   Final   Optimal:  <100 mg/dLNear or Above Optimal:  100-129 mg/dLBorderline High:  130-159 mg/dLHigh:  160-189 mg/dLVery High:  >190 mg/dL

## 2013-04-30 ENCOUNTER — Ambulatory Visit (HOSPITAL_COMMUNITY)
Admission: RE | Admit: 2013-04-30 | Discharge: 2013-04-30 | Disposition: A | Discharge status: Home / Self Care | Payer: Medicaid Other | Source: Ambulatory Visit | Attending: Internal Medicine | Admitting: Internal Medicine

## 2013-04-30 ENCOUNTER — Other Ambulatory Visit (HOSPITAL_BASED_OUTPATIENT_CLINIC_OR_DEPARTMENT_OTHER): Payer: Self-pay | Admitting: Internal Medicine

## 2013-04-30 DIAGNOSIS — M949 Disorder of cartilage, unspecified: Principal | ICD-10-CM

## 2013-04-30 DIAGNOSIS — M899 Disorder of bone, unspecified: Secondary | ICD-10-CM | POA: Insufficient documentation

## 2013-04-30 DIAGNOSIS — M869 Osteomyelitis, unspecified: Secondary | ICD-10-CM

## 2013-04-30 DIAGNOSIS — M2140 Flat foot [pes planus] (acquired), unspecified foot: Secondary | ICD-10-CM | POA: Insufficient documentation

## 2013-05-06 ENCOUNTER — Other Ambulatory Visit: Payer: Self-pay | Admitting: *Deleted

## 2013-05-06 ENCOUNTER — Telehealth: Payer: Self-pay | Admitting: *Deleted

## 2013-05-06 DIAGNOSIS — IMO0002 Reserved for concepts with insufficient information to code with codable children: Secondary | ICD-10-CM

## 2013-05-06 DIAGNOSIS — E1065 Type 1 diabetes mellitus with hyperglycemia: Secondary | ICD-10-CM

## 2013-05-06 NOTE — Telephone Encounter (Signed)
Mario with [employer] called and lvm requesting recent C-peptide with fasting glucose labs on pt to begin pump set up. If pt has not had these done in awhile, they request this be done as soon as possible. Call back [phone].

## 2013-05-07 ENCOUNTER — Encounter (HOSPITAL_BASED_OUTPATIENT_CLINIC_OR_DEPARTMENT_OTHER): Payer: Medicaid Other | Attending: Internal Medicine

## 2013-05-07 DIAGNOSIS — E1169 Type 2 diabetes mellitus with other specified complication: Secondary | ICD-10-CM | POA: Insufficient documentation

## 2013-05-07 DIAGNOSIS — L02619 Cutaneous abscess of unspecified foot: Secondary | ICD-10-CM | POA: Insufficient documentation

## 2013-05-07 DIAGNOSIS — L97509 Non-pressure chronic ulcer of other part of unspecified foot with unspecified severity: Secondary | ICD-10-CM | POA: Diagnosis not present

## 2013-05-07 DIAGNOSIS — L03039 Cellulitis of unspecified toe: Secondary | ICD-10-CM | POA: Diagnosis not present

## 2013-05-07 LAB — GLUCOSE, CAPILLARY
GLUCOSE-CAPILLARY: 40 mg/dL — AB (ref 70–99)
Glucose-Capillary: 75 mg/dL (ref 70–99)

## 2013-05-07 IMAGING — CR DG FOOT COMPLETE 3+V*L*
3 series · 3 of 3 positions shown · non-contrast
Comparison: 10/09/2011

CLINICAL DATA: Wound to the medial side of the left foot near the
calcaneus.  Wound infection.

LEFT FOOT - COMPLETE 3+ VIEW

[t foot ap left]
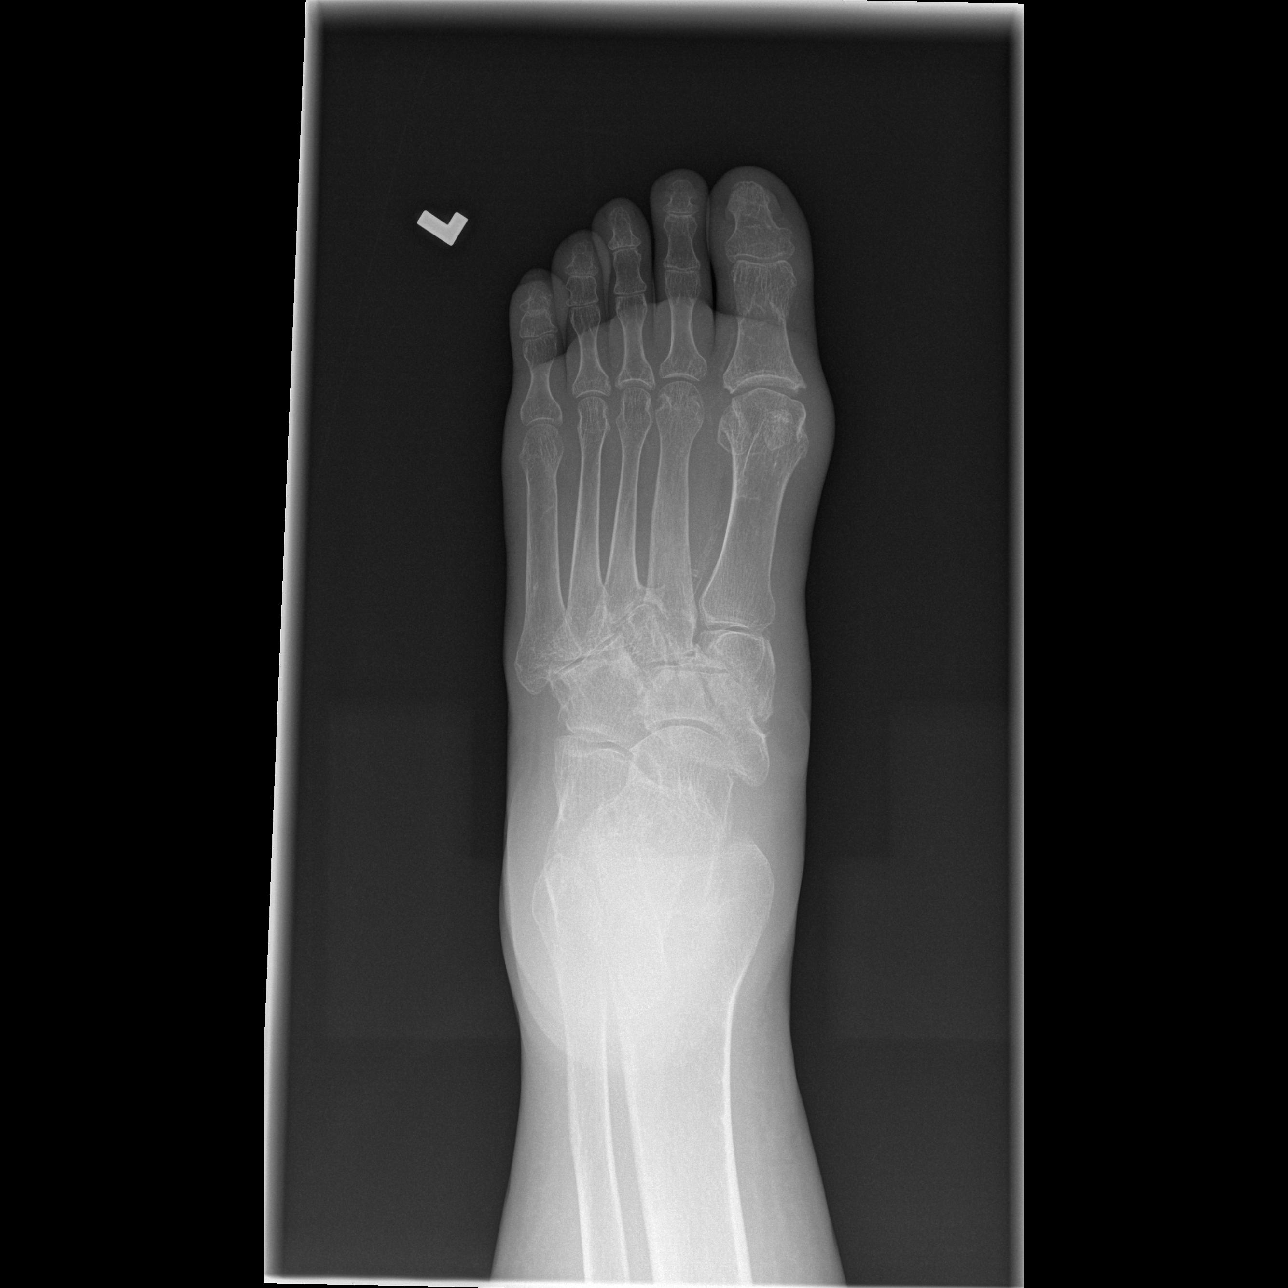

[t foot oblique left]
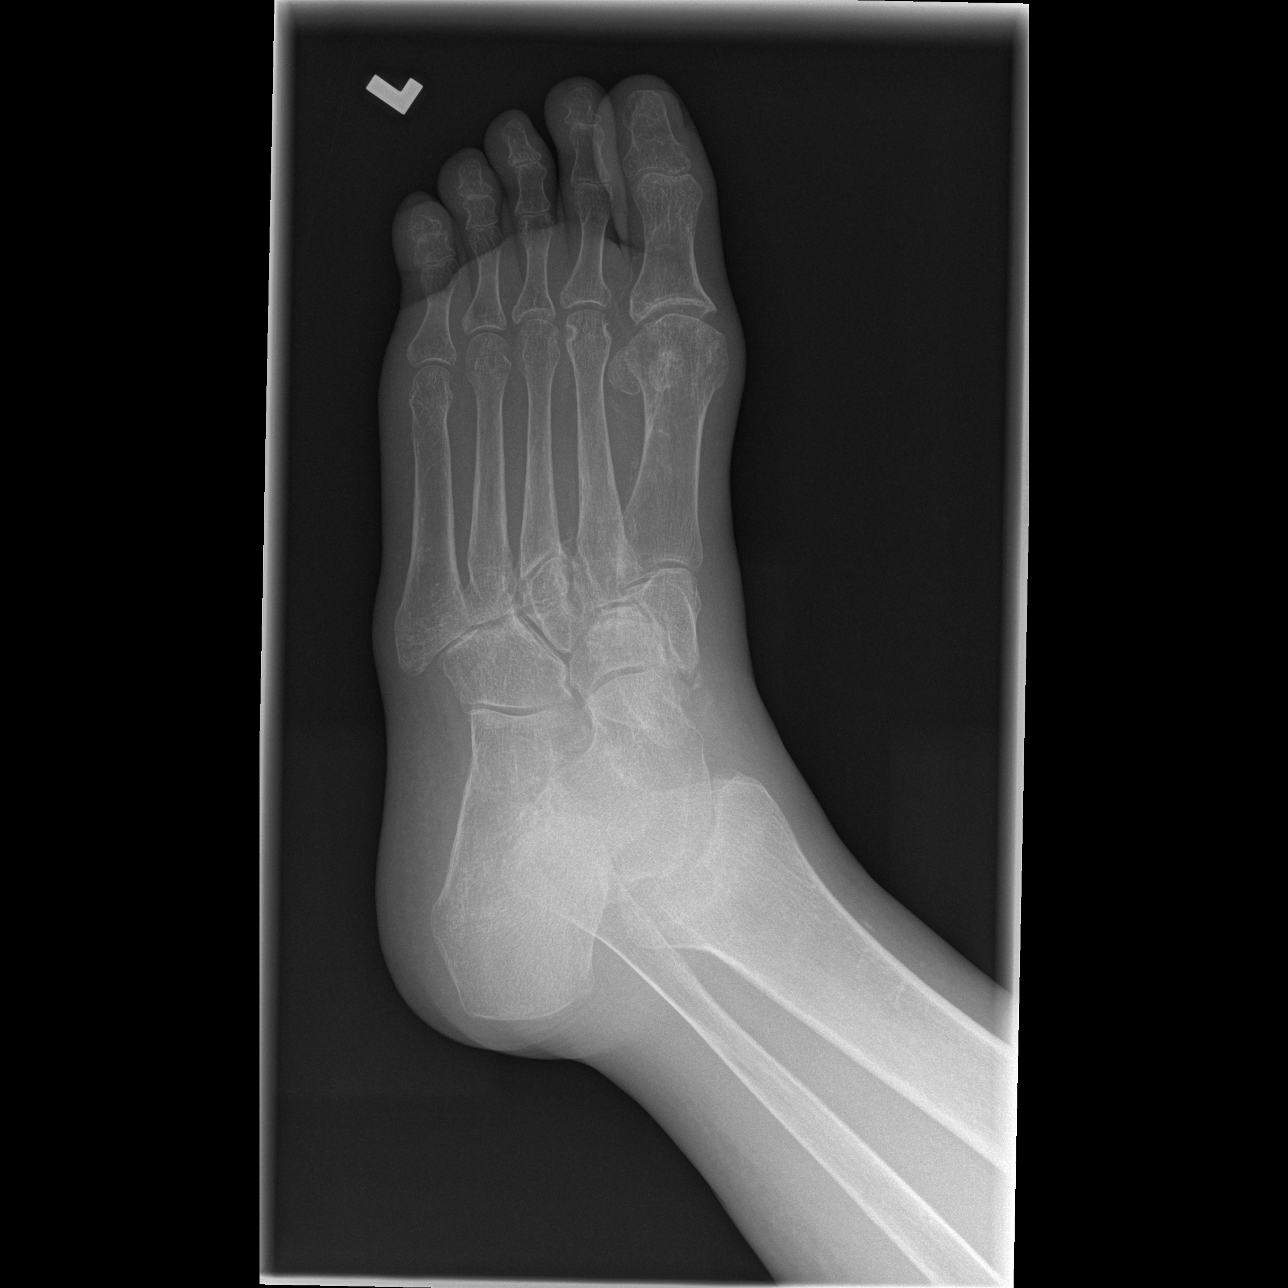

[t foot lat left]
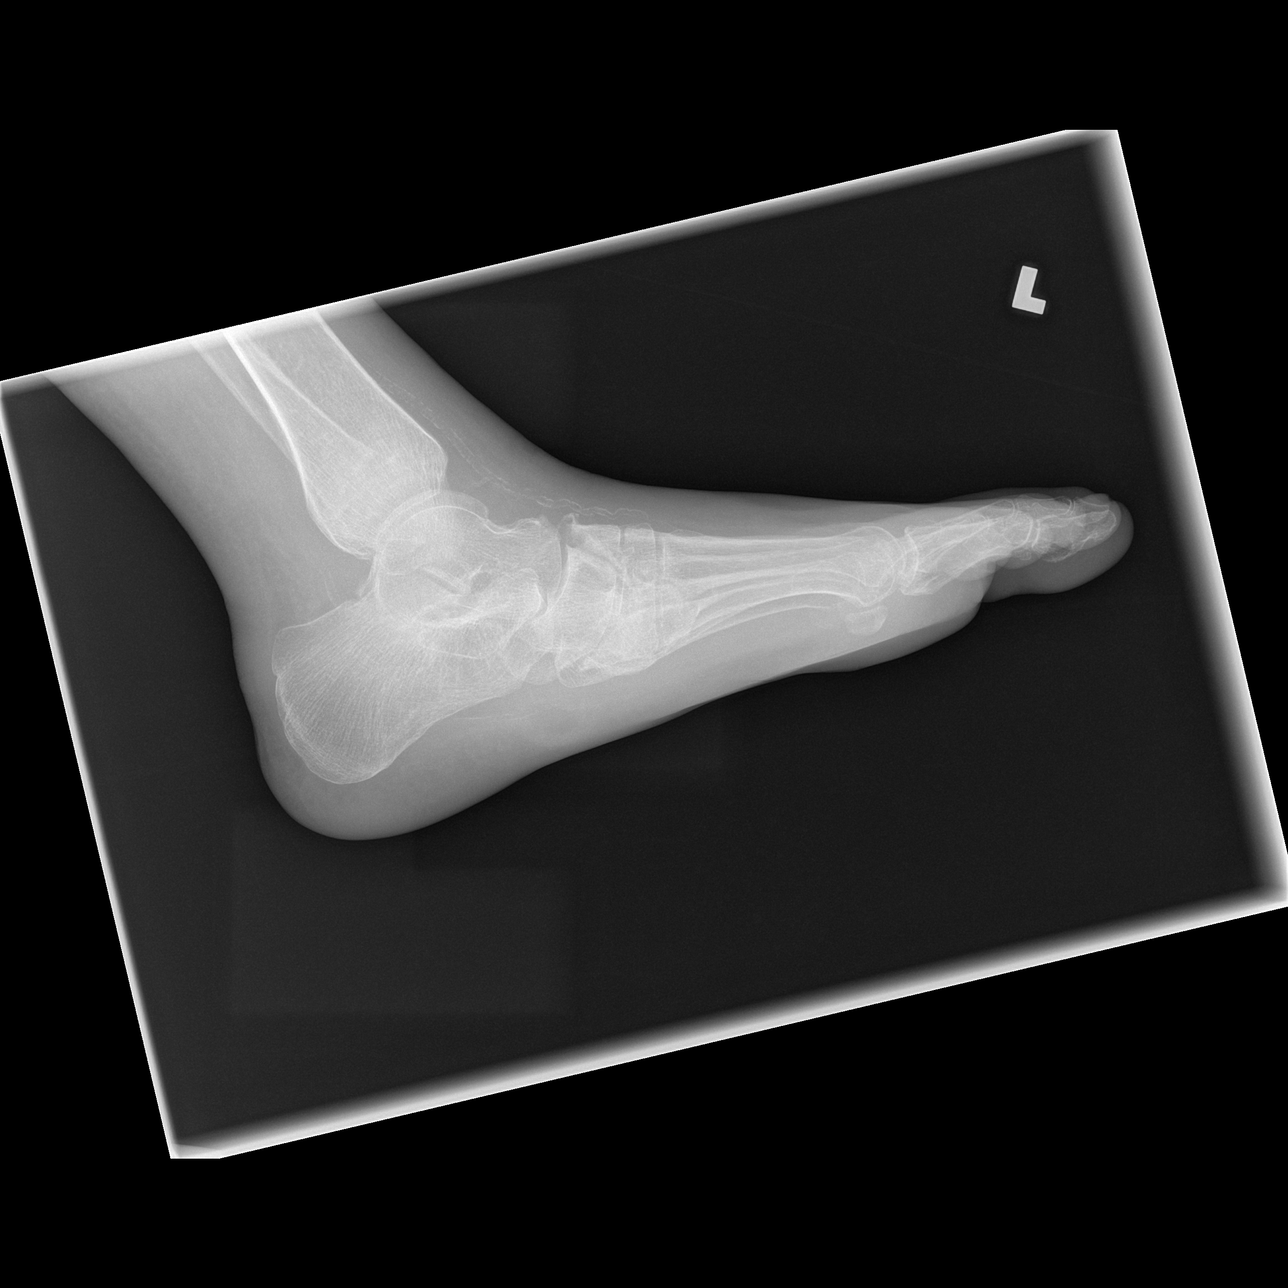

[3 of 3 positions shown; findings below may reference images not displayed]

FINDINGS: Diffuse bone demineralization.  Degenerative changes in
the tarsal, tarsometatarsal, and metatarsal phalangeal joints.
Suggestion of periarticular erosions in the first and second
metatarsal phalangeal joints.  Old fractures again demonstrated in
the second and third proximal metatarsals, navicular, and medial
cuneiform.  Vascular calcifications.  Mild pes planus.  No evidence
of acute bone destruction or sclerosis or periosteal reaction in
the calcaneus.  Nothing to suggest osteomyelitis.
IMPRESSION: No plain film evidence of osteomyelitis.  Degenerative changes in
the tarsal and metatarsal phalangeal joints with old fracture
deformities, stable since previous study.  Suggestion of
periarticular erosions in the first and second metatarsal
phalangeal joints.

## 2013-05-08 ENCOUNTER — Other Ambulatory Visit (INDEPENDENT_AMBULATORY_CARE_PROVIDER_SITE_OTHER): Payer: Medicaid Other

## 2013-05-08 DIAGNOSIS — IMO0002 Reserved for concepts with insufficient information to code with codable children: Secondary | ICD-10-CM

## 2013-05-08 DIAGNOSIS — E1065 Type 1 diabetes mellitus with hyperglycemia: Secondary | ICD-10-CM

## 2013-05-10 LAB — C-PEPTIDE

## 2013-05-11 ENCOUNTER — Other Ambulatory Visit (HOSPITAL_BASED_OUTPATIENT_CLINIC_OR_DEPARTMENT_OTHER): Payer: Self-pay | Admitting: Internal Medicine

## 2013-05-11 LAB — BASIC METABOLIC PANEL
BUN: 47 mg/dL — ABNORMAL HIGH (ref 6–23)
CHLORIDE: 107 meq/L (ref 96–112)
CO2: 23 mEq/L (ref 19–32)
Calcium: 8.8 mg/dL (ref 8.4–10.5)
Creatinine, Ser: 1.3 mg/dL — ABNORMAL HIGH (ref 0.4–1.2)
GFR: 46.22 mL/min — ABNORMAL LOW (ref >60.00)
Glucose, Bld: 54 mg/dL — ABNORMAL LOW (ref 70–99)
POTASSIUM: 4.3 meq/L (ref 3.5–5.1)
SODIUM: 137 meq/L (ref 135–145)

## 2013-05-11 LAB — GLUCOSE, RANDOM: GLUCOSE: 54 mg/dL — AB (ref 70–99)

## 2013-05-21 ENCOUNTER — Other Ambulatory Visit: Payer: Self-pay | Admitting: *Deleted

## 2013-05-21 ENCOUNTER — Other Ambulatory Visit: Payer: Medicaid Other

## 2013-05-21 ENCOUNTER — Other Ambulatory Visit: Payer: Self-pay | Admitting: Endocrinology

## 2013-05-21 DIAGNOSIS — E1140 Type 2 diabetes mellitus with diabetic neuropathy, unspecified: Secondary | ICD-10-CM

## 2013-05-21 DIAGNOSIS — E1065 Type 1 diabetes mellitus with hyperglycemia: Secondary | ICD-10-CM

## 2013-05-21 DIAGNOSIS — IMO0002 Reserved for concepts with insufficient information to code with codable children: Secondary | ICD-10-CM

## 2013-05-25 ENCOUNTER — Other Ambulatory Visit (INDEPENDENT_AMBULATORY_CARE_PROVIDER_SITE_OTHER): Payer: Medicaid Other

## 2013-05-25 ENCOUNTER — Ambulatory Visit: Payer: Medicaid Other | Admitting: Endocrinology

## 2013-05-25 DIAGNOSIS — E1142 Type 2 diabetes mellitus with diabetic polyneuropathy: Secondary | ICD-10-CM

## 2013-05-25 DIAGNOSIS — IMO0002 Reserved for concepts with insufficient information to code with codable children: Secondary | ICD-10-CM

## 2013-05-25 DIAGNOSIS — E1140 Type 2 diabetes mellitus with diabetic neuropathy, unspecified: Secondary | ICD-10-CM

## 2013-05-25 DIAGNOSIS — E1065 Type 1 diabetes mellitus with hyperglycemia: Secondary | ICD-10-CM

## 2013-05-25 DIAGNOSIS — E1149 Type 2 diabetes mellitus with other diabetic neurological complication: Secondary | ICD-10-CM

## 2013-05-25 LAB — COMPREHENSIVE METABOLIC PANEL
ALK PHOS: 100 U/L (ref 39–117)
ALT: 35 U/L (ref 0–35)
AST: 27 U/L (ref 0–37)
Albumin: 3 g/dL — ABNORMAL LOW (ref 3.5–5.2)
BILIRUBIN TOTAL: 0.2 mg/dL — AB (ref 0.3–1.2)
BUN: 21 mg/dL (ref 6–23)
CO2: 15 mEq/L — ABNORMAL LOW (ref 19–32)
Calcium: 8.6 mg/dL (ref 8.4–10.5)
Chloride: 110 mEq/L (ref 96–112)
Creatinine, Ser: 1.8 mg/dL — ABNORMAL HIGH (ref 0.4–1.2)
GFR: 33.96 mL/min — ABNORMAL LOW (ref >60.00)
Glucose, Bld: 417 mg/dL — ABNORMAL HIGH (ref 70–99)
Potassium: 5.3 mEq/L — ABNORMAL HIGH (ref 3.5–5.1)
SODIUM: 134 meq/L — AB (ref 135–145)
Total Protein: 7 g/dL (ref 6.0–8.3)

## 2013-05-25 LAB — LIPID PANEL
Cholesterol: 214 mg/dL — ABNORMAL HIGH (ref 0–200)
HDL: 31 mg/dL — AB (ref >39.00)
TRIGLYCERIDES: 337 mg/dL — AB (ref 0.0–149.0)
Total CHOL/HDL Ratio: 7
VLDL: 67.4 mg/dL — AB (ref 0.0–40.0)

## 2013-05-25 LAB — LDL CHOLESTEROL, DIRECT: Direct LDL: 112.2 mg/dL

## 2013-05-26 LAB — FRUCTOSAMINE: FRUCTOSAMINE: 383 umol/L — AB (ref <285)

## 2013-06-01 ENCOUNTER — Encounter: Payer: Self-pay | Admitting: *Deleted

## 2013-06-01 ENCOUNTER — Emergency Department (HOSPITAL_BASED_OUTPATIENT_CLINIC_OR_DEPARTMENT_OTHER)
Admission: EM | Admit: 2013-06-01 | Discharge: 2013-06-02 | Disposition: A | Discharge status: Home / Self Care | Payer: Medicaid Other | Attending: Emergency Medicine | Admitting: Emergency Medicine

## 2013-06-01 ENCOUNTER — Telehealth: Payer: Self-pay | Admitting: Endocrinology

## 2013-06-01 ENCOUNTER — Encounter (HOSPITAL_BASED_OUTPATIENT_CLINIC_OR_DEPARTMENT_OTHER): Payer: Self-pay | Admitting: Emergency Medicine

## 2013-06-01 ENCOUNTER — Emergency Department (HOSPITAL_BASED_OUTPATIENT_CLINIC_OR_DEPARTMENT_OTHER): Payer: Medicaid Other

## 2013-06-01 ENCOUNTER — Ambulatory Visit: Payer: Medicaid Other | Admitting: Endocrinology

## 2013-06-01 DIAGNOSIS — E785 Hyperlipidemia, unspecified: Secondary | ICD-10-CM | POA: Insufficient documentation

## 2013-06-01 DIAGNOSIS — Z794 Long term (current) use of insulin: Secondary | ICD-10-CM | POA: Insufficient documentation

## 2013-06-01 DIAGNOSIS — L97509 Non-pressure chronic ulcer of other part of unspecified foot with unspecified severity: Secondary | ICD-10-CM | POA: Insufficient documentation

## 2013-06-01 DIAGNOSIS — E1149 Type 2 diabetes mellitus with other diabetic neurological complication: Secondary | ICD-10-CM | POA: Insufficient documentation

## 2013-06-01 DIAGNOSIS — S99929A Unspecified injury of unspecified foot, initial encounter: Principal | ICD-10-CM

## 2013-06-01 DIAGNOSIS — W010XXA Fall on same level from slipping, tripping and stumbling without subsequent striking against object, initial encounter: Secondary | ICD-10-CM | POA: Insufficient documentation

## 2013-06-01 DIAGNOSIS — Z862 Personal history of diseases of the blood and blood-forming organs and certain disorders involving the immune mechanism: Secondary | ICD-10-CM | POA: Insufficient documentation

## 2013-06-01 DIAGNOSIS — Y929 Unspecified place or not applicable: Secondary | ICD-10-CM | POA: Insufficient documentation

## 2013-06-01 DIAGNOSIS — I10 Essential (primary) hypertension: Secondary | ICD-10-CM | POA: Insufficient documentation

## 2013-06-01 DIAGNOSIS — S8990XA Unspecified injury of unspecified lower leg, initial encounter: Secondary | ICD-10-CM | POA: Insufficient documentation

## 2013-06-01 DIAGNOSIS — R609 Edema, unspecified: Secondary | ICD-10-CM | POA: Insufficient documentation

## 2013-06-01 DIAGNOSIS — Z792 Long term (current) use of antibiotics: Secondary | ICD-10-CM | POA: Insufficient documentation

## 2013-06-01 DIAGNOSIS — M25579 Pain in unspecified ankle and joints of unspecified foot: Secondary | ICD-10-CM

## 2013-06-01 DIAGNOSIS — E1142 Type 2 diabetes mellitus with diabetic polyneuropathy: Secondary | ICD-10-CM | POA: Insufficient documentation

## 2013-06-01 DIAGNOSIS — Z7982 Long term (current) use of aspirin: Secondary | ICD-10-CM | POA: Insufficient documentation

## 2013-06-01 DIAGNOSIS — Z87891 Personal history of nicotine dependence: Secondary | ICD-10-CM | POA: Insufficient documentation

## 2013-06-01 DIAGNOSIS — G8929 Other chronic pain: Secondary | ICD-10-CM | POA: Insufficient documentation

## 2013-06-01 DIAGNOSIS — Y939 Activity, unspecified: Secondary | ICD-10-CM | POA: Insufficient documentation

## 2013-06-01 DIAGNOSIS — Z79899 Other long term (current) drug therapy: Secondary | ICD-10-CM | POA: Insufficient documentation

## 2013-06-01 DIAGNOSIS — S99919A Unspecified injury of unspecified ankle, initial encounter: Principal | ICD-10-CM

## 2013-06-01 DIAGNOSIS — E7800 Pure hypercholesterolemia, unspecified: Secondary | ICD-10-CM | POA: Insufficient documentation

## 2013-06-01 DIAGNOSIS — M25569 Pain in unspecified knee: Secondary | ICD-10-CM

## 2013-06-01 MED ORDER — ACETAMINOPHEN 325 MG PO TABS
650.0000 mg | ORAL_TABLET | Freq: Once | ORAL | Status: AC
  Administered 2013-06-01: 650 mg via ORAL
  Filled 2013-06-01: qty 2

## 2013-06-01 NOTE — ED Notes (Signed)
Fell onto her left knee on Sat. No relief with pain medication.

## 2013-06-01 NOTE — Telephone Encounter (Signed)
Left message on patients voicemail to reschedule missed appointment.

## 2013-06-01 NOTE — Telephone Encounter (Signed)
Her kidney test is worse and glucose was over 400 on her labs. She needs to come in for office visit which she missed. Also will need referral to nephrologist for management of kidney problems, will discuss when she comes in

## 2013-06-01 NOTE — ED Provider Notes (Signed)
CSN: [id number]     Arrival date & time 06/01/13  2110 History   First MD Initiated Contact with Patient 06/01/13 2248     Chief Complaint  Patient presents with  . Leg Pain    HPI   [name] is a 43 y.o. female with a PMH of DM, HTN, HLD, seasonal allergies, anemia, and left foot infection who presents to the ED for evaluation of left knee pain. History was provided by the patient. Patient states that she tripped on fell on her left knee on Saturday (2 days ago). She complains of associated left knee swelling and pain to the inside of her left knee. No other injuries or including head injury or LOC. She has been hydrocodone for relief of her knee pain from a previous dental infection. She has loss of sensation in her left foot at baseline. She also complains of chronic left foot pain which has been present for two years. Patient has diabetic neuropathy and goes to the wound clinic weekly but has missed a few appointments. She is currently on antibiotics since 05/15/13 which include Augmentin and Bactrim. Wounds are improving. No fevers. Patient has chronic bilateral leg edema which is her baseline. No hx of DVT. Patient states they did an [place] previously to evaluate for this.    Past Medical History  Diagnosis Date  . Diabetes mellitus   . Neuropathy in diabetes   . Hypertension   . Hypercholesteremia   . H/O seasonal allergies   . Anemia   . Left foot infection    Past Surgical History  Procedure Laterality Date  . Cesarean section    . Eye surgery    . Hemrrhoidectomy    . Peripherally inserted central catheter insertion     No family history on file. History  Substance Use Topics  . Smoking status: Former Smoker    Quit date: 05/16/2008  . Smokeless tobacco: Not on file  . Alcohol Use: 0.5 oz/week    1 drink(s) per week     Comment: Occasional   OB History   Grav Para Term Preterm Abortions TAB SAB Ect Mult Living                 Review of Systems   Constitutional: Negative for fever, chills, diaphoresis, activity change and appetite change.  Respiratory: Negative for cough.   Cardiovascular: Negative for chest pain.  Gastrointestinal: Negative for nausea, vomiting, abdominal pain and diarrhea.  Musculoskeletal: Positive for arthralgias and joint swelling. Negative for back pain, gait problem, myalgias and neck pain.  Skin: Positive for wound.  Neurological: Negative for weakness, numbness and headaches.    Allergies  Ciprofloxacin and Cleocin  Home Medications   Current Outpatient Rx  Name  Route  Sig  Dispense  Refill  . amoxicillin-clavulanate (AUGMENTIN) 875-125 MG per tablet   Oral   Take 1 tablet by mouth 2 (two) times daily.         . pravastatin (PRAVACHOL) 20 MG tablet   Oral   Take 20 mg by mouth daily.         Marland Kitchen sulfamethoxazole-trimethoprim (BACTRIM DS) 800-160 MG per tablet   Oral   Take 1 tablet by mouth 2 (two) times daily.         Marland Kitchen albuterol (PROVENTIL HFA;VENTOLIN HFA) 108 (90 BASE) MCG/ACT inhaler   Inhalation   Inhale 2 puffs into the lungs every 6 (six) hours as needed. S.O.B.         .  aspirin 325 MG tablet   Oral   Take 325 mg by mouth daily.         . furosemide (LASIX) 40 MG tablet   Oral   Take 1 tablet (40 mg total) by mouth daily.   30 tablet   3   . glucose blood test strip      Use as instructed         . EXPIRED: insulin aspart (NOVOLOG) 100 UNIT/ML injection      2-17 Units. Takes on a sliding scale         . insulin glargine (LANTUS) 100 UNIT/ML injection   Subcutaneous   Inject 16 Units into the skin 2 (two) times daily. Take 16 units in am and 5 units at night         . insulin lispro (HUMALOG) 100 UNIT/ML injection   Subcutaneous   Inject 2-7 Units into the skin 3 (three) times daily before meals. Pt on sliding scale         . Insulin Pen Needle (BD PEN NEEDLE NANO U/F) 32G X 4 MM MISC      USE AS DIRECTED AS NEEDED         . Misc. Devices  MISC      Glucose blood test strip. Blood sugar to be tested 5 times daily.          BP 167/84  Pulse 93  Temp(Src) 98.5 F (36.9 C) (Oral)  Resp 20  Ht 5\' 8"  (1.727 m)  Wt 134 lb (60.782 kg)  BMI 20.38 kg/m2  SpO2 99%  LMP 05/21/2013  Filed Vitals:   06/01/13 2120 06/02/13 0227  BP: 167/84 138/78  Pulse: 93 88  Temp: 98.5 F (36.9 C) 98.3 F (36.8 C)  TempSrc: Oral Oral  Resp: 20 18  Height: 5\' 8"  (1.727 m)   Weight: 134 lb (60.782 kg)   SpO2: 99% 96%     Physical Exam  Nursing note and vitals reviewed. Constitutional: She is oriented to person, place, and time. She appears well-developed and well-nourished. No distress.  HENT:  Head: Normocephalic and atraumatic.  Right Ear: External ear normal.  Left Ear: External ear normal.  Nose: Nose normal.  Mouth/Throat: Oropharynx is clear and moist.  Eyes: Conjunctivae are normal. Pupils are equal, round, and reactive to light. Right eye exhibits no discharge. Left eye exhibits no discharge.  Neck: Normal range of motion. Neck supple.  Cardiovascular: Normal rate, regular rhythm, normal heart sounds and intact distal pulses.  Exam reveals no gallop and no friction rub.   No murmur heard. Dorsalis pedis pulses present via doppler. Not palpable  Pulmonary/Chest: Effort normal and breath sounds normal. No respiratory distress. She has no wheezes. She has no rales. She exhibits no tenderness.  Abdominal: Soft. Bowel sounds are normal. She exhibits no distension. There is no tenderness.  Musculoskeletal: Normal range of motion. She exhibits edema and tenderness.  1+ pitting edema equal bilaterally. Tenderness to palpation to the left knee throughout with increased tenderness and swelling on the medial side. No joint effusion. No wounds, ecchymosis, or erythema. No calf tenderness. No tenderness to palpation to the left foot, ankle, or hip. No tenderness to palpation to the thoracic or lumbar spinous processes throughout.  No  tenderness to palpation to the paraspinal muscles throughout. ROM not limited in the LE's bilaterally  Neurological: She is alert and oriented to person, place, and time.  Sensation decreased in the the left foot throughout  Skin: Skin is warm and dry. She is not diaphoretic.     Ulceration lesions to the digits of the left foot in between the toes. Left 3rd digit is erythematous. No tenderness to palpation to the digits on the left. No drainage or bleeding.    ED Course  Procedures (including critical care time) Labs Review Labs Reviewed - No data to display Imaging Review Dg Knee Complete 4 Views Left  06/01/2013   CLINICAL DATA:  Fall 3 days ago.  Pain.  EXAM: LEFT KNEE - COMPLETE 4+ VIEW  COMPARISON:  None.  FINDINGS: Bones are osteopenic.  No fracture or dislocation.  Small joint effusion suspected which may reflect result of internal derangement. If there is persistent or progressive discomfort, MR imaging may be considered.  IMPRESSION: No fracture or dislocation.  Joint effusion suspected.  Please see above.   Electronically Signed   By: Chauncey Cruel M.D.   On: 06/01/2013 22:03     EKG Interpretation None        DG Knee Complete 4 Views Left (Final result)  Result time: 06/01/13 22:03:06    Final result by Rad Results In Interface (06/01/13 22:03:06)    Narrative:   CLINICAL DATA: Fall 3 days ago. Pain.  EXAM: LEFT KNEE - COMPLETE 4+ VIEW  COMPARISON: None.  FINDINGS: Bones are osteopenic.  No fracture or dislocation.  Small joint effusion suspected which may reflect result of internal derangement. If there is persistent or progressive discomfort, MR imaging may be considered.  IMPRESSION: No fracture or dislocation.  Joint effusion suspected. Please see above.   Electronically Signed By: Chauncey Cruel M.D. On: 06/01/2013 22:03    DG Ankle Complete Left (Final result)  Result time: 06/02/13 01:27:14    Final result by Rad Results In Interface (06/02/13  01:27:14)    Narrative:   CLINICAL DATA: Fall with ankle pain.  EXAM: LEFT ANKLE COMPLETE - 3+ VIEW  COMPARISON: 04/30/2013 foot radiography  FINDINGS: No acute fracture or malalignment. The bones are osteopenic and there is diffuse arterial calcification. Pes planus.  IMPRESSION: No acute osseous abnormality.   Electronically Signed By: Jorje Guild M.D. On: 06/02/2013 01:27   Results for orders placed during the hospital encounter of 06/01/13  CBG MONITORING, ED      Result Value Ref Range   Glucose-Capillary 350 (*) 70 - 99 mg/dL    MDM   JEANINE BRANSOM is a 43 y.o. female  with a PMH of DM, HTN, HLD, seasonal allergies, anemia, and left foot infection who presents to the ED for evaluation of left knee pain.  Rechecks  12:05 AM = Dr. Karle Starch examined patient. Ordering ankle x-ray for ankle tenderness, which was not appreciated on my exam.  12:22 AM = Spoke with possibility of patient needing IV antibiotics for diabetic wounds. Patient does not want to be further evaluated or be hospitalized.    Patient evaluated for knee pain after mechanical fall. Knee pain possibly due to contusion vs sprain vs ligament injury. X-rays negative for fracture or malalignment, however, small joint effusion suspected. Decreased sensation in the left foot and non-palpable pulses (found using doppler) likely due to chronic uncontrolled diabetes. Offered patient knee immobilization and crutches which she declined. States she has crutches at home. RICE method discussed. Patient has evidence of chronic diabetic foot ulcers and is currently on antibiotics. Dressings changed in the ED. Patient afebrile and non-toxic in appearance. Declined further evaluation and management of this. Going to wound clinic. Has  appointment on 06/04/13. Leg edema at baseline per patient. Pain medication provided. Return precautions, discharge instructions, and follow-up was discussed with the patient before discharge.      Discharge Medication List as of 06/02/2013  1:53 AM    START taking these medications   Details  HYDROcodone-acetaminophen (NORCO/VICODIN) 5-325 MG per tablet Take 1-2 tablets by mouth every 4 (four) hours as needed., Starting 06/02/2013, Until Discontinued, Print         Final impressions: 1. Ankle pain   2. Knee pain      Mercy Moore PA-C   This patient was discussed with Dr. Paticia Stack, PA-C 06/03/13 1346

## 2013-06-02 ENCOUNTER — Emergency Department (HOSPITAL_BASED_OUTPATIENT_CLINIC_OR_DEPARTMENT_OTHER): Payer: Medicaid Other

## 2013-06-02 LAB — CBG MONITORING, ED: GLUCOSE-CAPILLARY: 350 mg/dL — AB (ref 70–99)

## 2013-06-02 MED ORDER — HYDROCODONE-ACETAMINOPHEN 5-325 MG PO TABS: 1.0000 | ORAL_TABLET | ORAL | Status: DC | PRN

## 2013-06-02 NOTE — Discharge Instructions (Signed)
Follow the RICE method - see below  Take Vicodin for severe pain - Please be careful with this medication.  It can cause drowsiness.  Use caution while driving, operating machinery, drinking alcohol, or any other activities that may impair your physical or mental abilities.   Be careful not to take too much Tylenol - there is Tylenol in Vicodin (max 4,000 mg Tylenol daily) Continue to monitor your blood sugars regularly  Continue antibiotics  Return to the emergency department if you develop any changing/worsening condition, fever, weakness, spreading redness/swelling, or any other concerns (please read additional information regarding your condition below)     Knee Pain Knee pain can be a result of an injury or other medical conditions. Treatment will depend on the cause of your pain. HOME CARE  Only take medicine as told by your doctor.  Keep a healthy weight. Being overweight can make the knee hurt more.  Stretch before exercising or playing sports.  If there is constant knee pain, change the way you exercise. Ask your doctor for advice.  Make sure shoes fit well. Choose the right shoe for the sport or activity.  Protect your knees. Wear kneepads if needed.  Rest when you are tired. GET HELP RIGHT AWAY IF:   Your knee pain does not stop.  Your knee pain does not get better.  Your knee joint feels hot to the touch.  You have a fever. MAKE SURE YOU:   Understand these instructions.  Will watch this condition.  Will get help right away if you are not doing well or get worse. Document Released: 06/15/2008 Document Revised: 06/11/2011 Document Reviewed: 06/15/2008 [facility] Patient Information 2014 [place], [place].   Ankle Pain Ankle pain is a common symptom. The bones, cartilage, tendons, and muscles of the ankle joint perform a lot of work each day. The ankle joint holds your body weight and allows you to move around. Ankle pain can occur on either side or back of 1 or  both ankles. Ankle pain may be sharp and burning or dull and aching. There may be tenderness, stiffness, redness, or warmth around the ankle. The pain occurs more often when a person walks or puts pressure on the ankle. CAUSES  There are many reasons ankle pain can develop. It is important to work with your caregiver to identify the cause since many conditions can impact the bones, cartilage, muscles, and tendons. Causes for ankle pain include:  Injury, including a break (fracture), sprain, or strain often due to a fall, sports, or a high-impact activity.  Swelling (inflammation) of a tendon (tendonitis).  Achilles tendon rupture.  Ankle instability after repeated sprains and strains.  Poor foot alignment.  Pressure on a nerve (tarsal tunnel syndrome).  Arthritis in the ankle or the lining of the ankle.  Crystal formation in the ankle (gout or pseudogout). DIAGNOSIS  A diagnosis is based on your medical history, your symptoms, results of your physical exam, and results of diagnostic tests. Diagnostic tests may include X-ray exams or a computerized magnetic scan (magnetic resonance imaging, MRI). TREATMENT  Treatment will depend on the cause of your ankle pain and may include:  Keeping pressure off the ankle and limiting activities.  Using crutches or other walking support (a cane or brace).  Using rest, ice, compression, and elevation.  Participating in physical therapy or home exercises.  Wearing shoe inserts or special shoes.  Losing weight.  Taking medications to reduce pain or swelling or receiving an injection.  Undergoing surgery.  HOME CARE INSTRUCTIONS   Only take over-the-counter or prescription medicines for pain, discomfort, or fever as directed by your caregiver.  Put ice on the injured area.  Put ice in a plastic bag.  Place a towel between your skin and the bag.  Leave the ice on for 15-20 minutes at a time, 03-04 times a day.  Keep your leg raised  (elevated) when possible to lessen swelling.  Avoid activities that cause ankle pain.  Follow specific exercises as directed by your caregiver.  Record how often you have ankle pain, the location of the pain, and what it feels like. This information may be helpful to you and your caregiver.  Ask your caregiver about returning to work or sports and whether you should drive.  Follow up with your caregiver for further examination, therapy, or testing as directed. SEEK MEDICAL CARE IF:   Pain or swelling continues or worsens beyond 1 week.  You have an oral temperature above 102 F (38.9 C).  You are feeling unwell or have chills.  You are having an increasingly difficult time with walking.  You have loss of sensation or other new symptoms.  You have questions or concerns. MAKE SURE YOU:   Understand these instructions.  Will watch your condition.  Will get help right away if you are not doing well or get worse. Document Released: 09/06/2009 Document Revised: 06/11/2011 Document Reviewed: 09/06/2009 [facility] Patient Information 2014 [place].  RICE: Routine Care for Injuries The routine care of many injuries includes Rest, Ice, Compression, and Elevation (RICE). HOME CARE INSTRUCTIONS  Rest is needed to allow your body to heal. Routine activities can usually be resumed when comfortable. Injured tendons and bones can take up to 6 weeks to heal. Tendons are the cord-like structures that attach muscle to bone.  Ice following an injury helps keep the swelling down and reduces pain.  Put ice in a plastic bag.  Place a towel between your skin and the bag.  Leave the ice on for 15-20 minutes, 03-04 times a day. Do this while awake, for the first 24 to 48 hours. After that, continue as directed by your caregiver.  Compression helps keep swelling down. It also gives support and helps with discomfort. If an elastic bandage has been applied, it should be removed and reapplied  every 3 to 4 hours. It should not be applied tightly, but firmly enough to keep swelling down. Watch fingers or toes for swelling, bluish discoloration, coldness, numbness, or excessive pain. If any of these problems occur, remove the bandage and reapply loosely. Contact your caregiver if these problems continue.  Elevation helps reduce swelling and decreases pain. With extremities, such as the arms, hands, legs, and feet, the injured area should be placed near or above the level of the heart, if possible. SEEK IMMEDIATE MEDICAL CARE IF:  You have persistent pain and swelling.  You develop redness, numbness, or unexpected weakness.  Your symptoms are getting worse rather than improving after several days. These symptoms may indicate that further evaluation or further X-rays are needed. Sometimes, X-rays may not show a small broken bone (fracture) until 1 week or 10 days later. Make a follow-up appointment with your caregiver. Ask when your X-ray results will be ready. Make sure you get your X-ray results. Document Released: 07/01/2000 Document Revised: 06/11/2011 Document Reviewed: 08/18/2010 [facility] Patient Information 2014 [place], [place].  Diabetes and Foot Care Diabetes may cause you to have problems because of poor blood supply (circulation) to  your feet and legs. This may cause the skin on your feet to become thinner, break easier, and heal more slowly. Your skin may become dry, and the skin may peel and crack. You may also have nerve damage in your legs and feet causing decreased feeling in them. You may not notice minor injuries to your feet that could lead to infections or more serious problems. Taking care of your feet is one of the most important things you can do for yourself.  HOME CARE INSTRUCTIONS  Wear shoes at all times, even in the house. Do not go barefoot. Bare feet are easily injured.  Check your feet daily for blisters, cuts, and redness. If you cannot see the bottom of  your feet, use a mirror or ask someone for help.  Wash your feet with warm water (do not use hot water) and mild soap. Then pat your feet and the areas between your toes until they are completely dry. Do not soak your feet as this can dry your skin.  Apply a moisturizing lotion or petroleum jelly (that does not contain alcohol and is unscented) to the skin on your feet and to dry, brittle toenails. Do not apply lotion between your toes.  Trim your toenails straight across. Do not dig under them or around the cuticle. File the edges of your nails with an emery board or nail file.  Do not cut corns or calluses or try to remove them with medicine.  Wear clean socks or stockings every day. Make sure they are not too tight. Do not wear knee-high stockings since they may decrease blood flow to your legs.  Wear shoes that fit properly and have enough cushioning. To break in new shoes, wear them for just a few hours a day. This prevents you from injuring your feet. Always look in your shoes before you put them on to be sure there are no objects inside.  Do not cross your legs. This may decrease the blood flow to your feet.  If you find a minor scrape, cut, or break in the skin on your feet, keep it and the skin around it clean and dry. These areas may be cleansed with mild soap and water. Do not cleanse the area with peroxide, alcohol, or iodine.  When you remove an adhesive bandage, be sure not to damage the skin around it.  If you have a wound, look at it several times a day to make sure it is healing.  Do not use heating pads or hot water bottles. They may burn your skin. If you have lost feeling in your feet or legs, you may not know it is happening until it is too late.  Make sure your health care provider performs a complete foot exam at least annually or more often if you have foot problems. Report any cuts, sores, or bruises to your health care provider immediately. SEEK MEDICAL CARE IF:    You have an injury that is not healing.  You have cuts or breaks in the skin.  You have an ingrown nail.  You notice redness on your legs or feet.  You feel burning or tingling in your legs or feet.  You have pain or cramps in your legs and feet.  Your legs or feet are numb.  Your feet always feel cold. SEEK IMMEDIATE MEDICAL CARE IF:   There is increasing redness, swelling, or pain in or around a wound.  There is a red line that goes up  your leg.  Pus is coming from a wound.  You develop a fever or as directed by your health care provider.  You notice a bad smell coming from an ulcer or wound. Document Released: 03/16/2000 Document Revised: 11/19/2012 Document Reviewed: 08/26/2012 [facility] Patient Information 2014 [place].

## 2013-06-04 ENCOUNTER — Encounter (HOSPITAL_BASED_OUTPATIENT_CLINIC_OR_DEPARTMENT_OTHER): Payer: Medicaid Other | Attending: Internal Medicine

## 2013-06-04 DIAGNOSIS — L97509 Non-pressure chronic ulcer of other part of unspecified foot with unspecified severity: Secondary | ICD-10-CM | POA: Insufficient documentation

## 2013-06-04 DIAGNOSIS — M869 Osteomyelitis, unspecified: Secondary | ICD-10-CM | POA: Insufficient documentation

## 2013-06-04 DIAGNOSIS — E1169 Type 2 diabetes mellitus with other specified complication: Secondary | ICD-10-CM | POA: Insufficient documentation

## 2013-06-09 NOTE — ED Provider Notes (Signed)
Medical screening examination/treatment/procedure(s) were conducted as a shared visit with non-physician practitioner(s) and myself.  I personally evaluated the patient during the encounter.   EKG Interpretation None      Chronic leg pain and swelling, non compliant with diabetes and wound care regimens. Primarily worried about recent fall. Not interested in evaluation of cellulitis or hyperglycemia.   Ladawna Walgren B. Karle Starch, MD 06/09/13 1434

## 2013-06-11 ENCOUNTER — Other Ambulatory Visit (HOSPITAL_BASED_OUTPATIENT_CLINIC_OR_DEPARTMENT_OTHER): Payer: Self-pay | Admitting: Internal Medicine

## 2013-06-11 ENCOUNTER — Inpatient Hospital Stay (HOSPITAL_COMMUNITY)
Admission: EM | Admit: 2013-06-11 | Discharge: 2013-06-15 | DRG: 638 | Disposition: A | Discharge status: Home / Self Care | Payer: Medicaid Other | Attending: Internal Medicine | Admitting: Internal Medicine

## 2013-06-11 ENCOUNTER — Encounter (HOSPITAL_COMMUNITY): Payer: Self-pay | Admitting: Emergency Medicine

## 2013-06-11 ENCOUNTER — Ambulatory Visit (HOSPITAL_COMMUNITY)
Admission: RE | Admit: 2013-06-11 | Discharge: 2013-06-11 | Disposition: A | Discharge status: Home / Self Care | Payer: Medicaid Other | Source: Ambulatory Visit | Attending: Internal Medicine | Admitting: Internal Medicine

## 2013-06-11 DIAGNOSIS — E1049 Type 1 diabetes mellitus with other diabetic neurological complication: Secondary | ICD-10-CM | POA: Diagnosis present

## 2013-06-11 DIAGNOSIS — M86179 Other acute osteomyelitis, unspecified ankle and foot: Secondary | ICD-10-CM | POA: Diagnosis present

## 2013-06-11 DIAGNOSIS — L03119 Cellulitis of unspecified part of limb: Secondary | ICD-10-CM

## 2013-06-11 DIAGNOSIS — L02619 Cutaneous abscess of unspecified foot: Secondary | ICD-10-CM | POA: Diagnosis present

## 2013-06-11 DIAGNOSIS — M869 Osteomyelitis, unspecified: Secondary | ICD-10-CM

## 2013-06-11 DIAGNOSIS — L03039 Cellulitis of unspecified toe: Secondary | ICD-10-CM | POA: Diagnosis present

## 2013-06-11 DIAGNOSIS — A4902 Methicillin resistant Staphylococcus aureus infection, unspecified site: Secondary | ICD-10-CM | POA: Diagnosis present

## 2013-06-11 DIAGNOSIS — N183 Chronic kidney disease, stage 3 unspecified: Secondary | ICD-10-CM | POA: Diagnosis present

## 2013-06-11 DIAGNOSIS — E1065 Type 1 diabetes mellitus with hyperglycemia: Secondary | ICD-10-CM | POA: Diagnosis present

## 2013-06-11 DIAGNOSIS — D509 Iron deficiency anemia, unspecified: Secondary | ICD-10-CM

## 2013-06-11 DIAGNOSIS — IMO0002 Reserved for concepts with insufficient information to code with codable children: Principal | ICD-10-CM | POA: Diagnosis present

## 2013-06-11 DIAGNOSIS — E785 Hyperlipidemia, unspecified: Secondary | ICD-10-CM | POA: Diagnosis present

## 2013-06-11 DIAGNOSIS — N189 Chronic kidney disease, unspecified: Secondary | ICD-10-CM | POA: Diagnosis present

## 2013-06-11 DIAGNOSIS — E7800 Pure hypercholesterolemia, unspecified: Secondary | ICD-10-CM | POA: Diagnosis present

## 2013-06-11 DIAGNOSIS — G609 Hereditary and idiopathic neuropathy, unspecified: Secondary | ICD-10-CM | POA: Diagnosis present

## 2013-06-11 DIAGNOSIS — Z9119 Patient's noncompliance with other medical treatment and regimen: Secondary | ICD-10-CM

## 2013-06-11 DIAGNOSIS — D638 Anemia in other chronic diseases classified elsewhere: Secondary | ICD-10-CM

## 2013-06-11 DIAGNOSIS — L97509 Non-pressure chronic ulcer of other part of unspecified foot with unspecified severity: Secondary | ICD-10-CM | POA: Diagnosis present

## 2013-06-11 DIAGNOSIS — Z91199 Patient's noncompliance with other medical treatment and regimen due to unspecified reason: Secondary | ICD-10-CM

## 2013-06-11 DIAGNOSIS — Z794 Long term (current) use of insulin: Secondary | ICD-10-CM

## 2013-06-11 DIAGNOSIS — R609 Edema, unspecified: Secondary | ICD-10-CM | POA: Diagnosis present

## 2013-06-11 DIAGNOSIS — E1069 Type 1 diabetes mellitus with other specified complication: Principal | ICD-10-CM

## 2013-06-11 DIAGNOSIS — M9080 Osteopathy in diseases classified elsewhere, unspecified site: Secondary | ICD-10-CM | POA: Diagnosis present

## 2013-06-11 DIAGNOSIS — Z87891 Personal history of nicotine dependence: Secondary | ICD-10-CM

## 2013-06-11 DIAGNOSIS — E1029 Type 1 diabetes mellitus with other diabetic kidney complication: Secondary | ICD-10-CM

## 2013-06-11 DIAGNOSIS — N049 Nephrotic syndrome with unspecified morphologic changes: Secondary | ICD-10-CM | POA: Diagnosis present

## 2013-06-11 MED ORDER — SODIUM CHLORIDE 0.9 % IV BOLUS (SEPSIS)
1000.0000 mL | Freq: Once | INTRAVENOUS | Status: AC
  Administered 2013-06-12: 1000 mL via INTRAVENOUS

## 2013-06-11 NOTE — ED Notes (Signed)
Pt reports she has had an ongoing infection to her L foot, reports that she has had multiple fractures to her foot as well. Pt reports that she has been seen at wound care clinic for the infection and was told today she would need to have a toe amputated. Pt reports that she wants to be evaluated for "Iv antibiotics, to be in the hospital for a week and have people serve me food so I can be off of my foot for a while." Pt a&o x4, ambulatory to triage.

## 2013-06-12 ENCOUNTER — Encounter (HOSPITAL_COMMUNITY): Payer: Self-pay | Admitting: Internal Medicine

## 2013-06-12 ENCOUNTER — Observation Stay (HOSPITAL_COMMUNITY): Payer: Medicaid Other

## 2013-06-12 DIAGNOSIS — R609 Edema, unspecified: Secondary | ICD-10-CM

## 2013-06-12 DIAGNOSIS — E1065 Type 1 diabetes mellitus with hyperglycemia: Secondary | ICD-10-CM

## 2013-06-12 DIAGNOSIS — N183 Chronic kidney disease, stage 3 unspecified: Secondary | ICD-10-CM | POA: Diagnosis present

## 2013-06-12 DIAGNOSIS — M869 Osteomyelitis, unspecified: Secondary | ICD-10-CM | POA: Diagnosis present

## 2013-06-12 DIAGNOSIS — D509 Iron deficiency anemia, unspecified: Secondary | ICD-10-CM

## 2013-06-12 DIAGNOSIS — N189 Chronic kidney disease, unspecified: Secondary | ICD-10-CM

## 2013-06-12 DIAGNOSIS — IMO0002 Reserved for concepts with insufficient information to code with codable children: Secondary | ICD-10-CM

## 2013-06-12 LAB — CBC WITH DIFFERENTIAL/PLATELET
BASOS PCT: 0 % (ref 0–1)
Basophils Absolute: 0 10*3/uL (ref 0.0–0.1)
Eosinophils Absolute: 0.1 10*3/uL (ref 0.0–0.7)
Eosinophils Relative: 1 % (ref 0–5)
HCT: 28.3 % — ABNORMAL LOW (ref 36.0–46.0)
HEMOGLOBIN: 9.2 g/dL — AB (ref 12.0–15.0)
LYMPHS PCT: 24 % (ref 12–46)
Lymphs Abs: 1.7 10*3/uL (ref 0.7–4.0)
MCH: 23.7 pg — ABNORMAL LOW (ref 26.0–34.0)
MCHC: 32.5 g/dL (ref 30.0–36.0)
MCV: 72.8 fL — ABNORMAL LOW (ref 78.0–100.0)
MONOS PCT: 12 % (ref 3–12)
Monocytes Absolute: 0.9 10*3/uL (ref 0.1–1.0)
NEUTROS ABS: 4.4 10*3/uL (ref 1.7–7.7)
NEUTROS PCT: 62 % (ref 43–77)
Platelets: 511 10*3/uL — ABNORMAL HIGH (ref 150–400)
RBC: 3.89 MIL/uL (ref 3.87–5.11)
RDW: 18.7 % — ABNORMAL HIGH (ref 11.5–15.5)
WBC: 7.1 10*3/uL (ref 4.0–10.5)

## 2013-06-12 LAB — CBC
HEMATOCRIT: 26 % — AB (ref 36.0–46.0)
Hemoglobin: 8.3 g/dL — ABNORMAL LOW (ref 12.0–15.0)
MCH: 23.5 pg — ABNORMAL LOW (ref 26.0–34.0)
MCHC: 31.9 g/dL (ref 30.0–36.0)
MCV: 73.7 fL — AB (ref 78.0–100.0)
Platelets: 395 10*3/uL (ref 150–400)
RBC: 3.53 MIL/uL — ABNORMAL LOW (ref 3.87–5.11)
RDW: 18.7 % — AB (ref 11.5–15.5)
WBC: 6.7 10*3/uL (ref 4.0–10.5)

## 2013-06-12 LAB — BASIC METABOLIC PANEL
BUN: 35 mg/dL — ABNORMAL HIGH (ref 6–23)
BUN: 32 mg/dL — AB (ref 6–23)
CHLORIDE: 104 meq/L (ref 96–112)
CO2: 21 mEq/L (ref 19–32)
CO2: 17 meq/L — AB (ref 19–32)
CREATININE: 1.28 mg/dL — AB (ref 0.50–1.10)
Calcium: 8.6 mg/dL (ref 8.4–10.5)
Calcium: 8 mg/dL — ABNORMAL LOW (ref 8.4–10.5)
Chloride: 97 mEq/L (ref 96–112)
Creatinine, Ser: 1.39 mg/dL — ABNORMAL HIGH (ref 0.50–1.10)
GFR calc Af Amer: 54 mL/min — ABNORMAL LOW (ref >90)
GFR calc Af Amer: 59 mL/min — ABNORMAL LOW (ref >90)
GFR calc non Af Amer: 51 mL/min — ABNORMAL LOW (ref >90)
GFR, EST NON AFRICAN AMERICAN: 46 mL/min — AB (ref >90)
Glucose, Bld: 267 mg/dL — ABNORMAL HIGH (ref 70–99)
Glucose, Bld: 208 mg/dL — ABNORMAL HIGH (ref 70–99)
POTASSIUM: 3.9 meq/L (ref 3.7–5.3)
Potassium: 4 mEq/L (ref 3.7–5.3)
Sodium: 132 mEq/L — ABNORMAL LOW (ref 137–147)
Sodium: 135 mEq/L — ABNORMAL LOW (ref 137–147)

## 2013-06-12 LAB — GLUCOSE, CAPILLARY
Glucose-Capillary: 197 mg/dL — ABNORMAL HIGH (ref 70–99)
Glucose-Capillary: 131 mg/dL — ABNORMAL HIGH (ref 70–99)
Glucose-Capillary: 117 mg/dL — ABNORMAL HIGH (ref 70–99)

## 2013-06-12 LAB — C-REACTIVE PROTEIN: CRP: 2.4 mg/dL — ABNORMAL HIGH (ref <0.60)

## 2013-06-12 LAB — MRSA PCR SCREENING: MRSA by PCR: POSITIVE — AB

## 2013-06-12 LAB — SEDIMENTATION RATE: Sed Rate: 75 mm/hr — ABNORMAL HIGH (ref 0–22)

## 2013-06-12 LAB — PREGNANCY, URINE: Preg Test, Ur: NEGATIVE

## 2013-06-12 MED ORDER — ACETAMINOPHEN 325 MG PO TABS
650.0000 mg | ORAL_TABLET | Freq: Four times a day (QID) | ORAL | Status: DC | PRN
  Administered 2013-06-14: 650 mg via ORAL
  Filled 2013-06-12: qty 2

## 2013-06-12 MED ORDER — VANCOMYCIN HCL 500 MG IV SOLR
500.0000 mg | Freq: Two times a day (BID) | INTRAVENOUS | Status: DC
  Administered 2013-06-12 – 2013-06-14 (×4): 500 mg via INTRAVENOUS
  Filled 2013-06-12 (×5): qty 500

## 2013-06-12 MED ORDER — INSULIN GLARGINE 100 UNIT/ML ~~LOC~~ SOLN
9.0000 [IU] | Freq: Two times a day (BID) | SUBCUTANEOUS | Status: DC
  Filled 2013-06-12 (×3): qty 0.09

## 2013-06-12 MED ORDER — ONDANSETRON HCL 4 MG/2ML IJ SOLN: 4.0000 mg | Freq: Four times a day (QID) | INTRAMUSCULAR | Status: DC | PRN

## 2013-06-12 MED ORDER — PIPERACILLIN-TAZOBACTAM 3.375 G IVPB
3.3750 g | Freq: Three times a day (TID) | INTRAVENOUS | Status: DC
  Administered 2013-06-12 – 2013-06-15 (×9): 3.375 g via INTRAVENOUS
  Filled 2013-06-12 (×11): qty 50

## 2013-06-12 MED ORDER — INSULIN GLARGINE 100 UNIT/ML ~~LOC~~ SOLN
16.0000 [IU] | Freq: Every day | SUBCUTANEOUS | Status: DC
  Filled 2013-06-12: qty 0.16

## 2013-06-12 MED ORDER — INSULIN GLARGINE 100 UNIT/ML ~~LOC~~ SOLN
9.0000 [IU] | Freq: Every day | SUBCUTANEOUS | Status: DC
Start: 2013-06-12 — End: 2013-06-12

## 2013-06-12 MED ORDER — PIPERACILLIN-TAZOBACTAM 3.375 G IVPB
3.3750 g | Freq: Once | INTRAVENOUS | Status: AC
  Administered 2013-06-12: 3.375 g via INTRAVENOUS
  Filled 2013-06-12: qty 50

## 2013-06-12 MED ORDER — MUPIROCIN 2 % EX OINT
TOPICAL_OINTMENT | Freq: Two times a day (BID) | CUTANEOUS | Status: DC
  Administered 2013-06-12 (×2): 1 via NASAL
  Administered 2013-06-13: 21:00:00 via NASAL
  Administered 2013-06-13: 1 via NASAL
  Administered 2013-06-14 – 2013-06-15 (×3): via NASAL
  Filled 2013-06-12: qty 22

## 2013-06-12 MED ORDER — INSULIN ASPART 100 UNIT/ML ~~LOC~~ SOLN
0.0000 [IU] | Freq: Three times a day (TID) | SUBCUTANEOUS | Status: DC
  Administered 2013-06-13 (×2): 5 [IU] via SUBCUTANEOUS
  Administered 2013-06-13: 4 [IU] via SUBCUTANEOUS

## 2013-06-12 MED ORDER — LOSARTAN POTASSIUM 25 MG PO TABS
25.0000 mg | ORAL_TABLET | Freq: Every day | ORAL | Status: DC
  Administered 2013-06-12 – 2013-06-15 (×4): 25 mg via ORAL
  Filled 2013-06-12 (×4): qty 1

## 2013-06-12 MED ORDER — INSULIN ASPART 100 UNIT/ML ~~LOC~~ SOLN
6.0000 [IU] | Freq: Once | SUBCUTANEOUS | Status: AC
  Administered 2013-06-12: 6 [IU] via SUBCUTANEOUS

## 2013-06-12 MED ORDER — SIMVASTATIN 10 MG PO TABS
10.0000 mg | ORAL_TABLET | Freq: Every day | ORAL | Status: DC
  Administered 2013-06-12 – 2013-06-14 (×3): 10 mg via ORAL
  Filled 2013-06-12 (×4): qty 1

## 2013-06-12 MED ORDER — ALBUTEROL SULFATE (2.5 MG/3ML) 0.083% IN NEBU: 3.0000 mL | INHALATION_SOLUTION | Freq: Four times a day (QID) | RESPIRATORY_TRACT | Status: DC | PRN

## 2013-06-12 MED ORDER — VANCOMYCIN HCL IN DEXTROSE 750-5 MG/150ML-% IV SOLN
750.0000 mg | Freq: Two times a day (BID) | INTRAVENOUS | Status: DC
  Administered 2013-06-12: 750 mg via INTRAVENOUS
  Filled 2013-06-12 (×2): qty 150

## 2013-06-12 MED ORDER — EPINEPHRINE 0.3 MG/0.3ML IJ SOAJ: 0.3000 mg | INTRAMUSCULAR | Status: DC | PRN

## 2013-06-12 MED ORDER — ACETAMINOPHEN 650 MG RE SUPP: 650.0000 mg | Freq: Four times a day (QID) | RECTAL | Status: DC | PRN

## 2013-06-12 MED ORDER — CHLORHEXIDINE GLUCONATE CLOTH 2 % EX PADS
6.0000 | MEDICATED_PAD | Freq: Every day | CUTANEOUS | Status: DC
  Administered 2013-06-13 – 2013-06-14 (×2): 6 via TOPICAL

## 2013-06-12 MED ORDER — SODIUM CHLORIDE 0.9 % IJ SOLN
3.0000 mL | Freq: Two times a day (BID) | INTRAMUSCULAR | Status: DC
  Administered 2013-06-14 – 2013-06-15 (×2): 3 mL via INTRAVENOUS

## 2013-06-12 MED ORDER — ONDANSETRON HCL 4 MG PO TABS: 4.0000 mg | ORAL_TABLET | Freq: Four times a day (QID) | ORAL | Status: DC | PRN

## 2013-06-12 NOTE — Progress Notes (Signed)
[facility] PROGRESS NOTE  JELESSA JURKOVICH [id number] DOB: 1970/12/08 DOA: 06/11/2013  PCP: Luetta Nutting, DO  Brief HPI: [name] is a 43 y.o. female with history of diabetes mellitus type 1, chronic kidney disease, hypertension, hyperlipidemia, chronic anemia started experiencing ulcers and her left toes over the last one month. She has been following up with wound clinic. There was increasing discharge from the wounds and x-rays done yesterday showed possible osteomyelitis of the second 2 toe and patient was started on antibiotics and admitted.  Past medical history:  Past Medical History  Diagnosis Date  . Diabetes mellitus   . Neuropathy in diabetes   . Hypertension   . Hypercholesteremia   . H/O seasonal allergies   . Anemia   . Left foot infection     Consultants: Ortho (Dr. Mardelle Matte)  Procedures: None yet  Antibiotics: Vanc 3/13--> Zosyn 3/13-->  Subjective: Patient has no pain in the left foot. Has noticed swelling in both legs for some time. Was on lasix which was stopped due to rise in creatinine.   Objective: Vital Signs  Filed Vitals:   06/11/13 2235 06/12/13 0234 06/12/13 0300  BP: 152/83 149/63 153/79  Pulse: 87 80 86  Temp: 98.1 F (36.7 C) 98 F (36.7 C) 98.2 F (36.8 C)  TempSrc:  Oral Oral  Resp: 16 18 20   Height: 5' 7.5" (1.715 m)  5\' 8"  (1.727 m)  Weight: 62.596 kg (138 lb)  62.097 kg (136 lb 14.4 oz)  SpO2: 100% 100% 97%   No intake or output data in the 24 hours ending 06/12/13 0956 Filed Weights   06/11/13 2235 06/12/13 0300  Weight: 62.596 kg (138 lb) 62.097 kg (136 lb 14.4 oz)    General appearance: alert, cooperative, appears stated age and no distress Resp: clear to auscultation bilaterally Cardio: regular rate and rhythm, S1, S2 normal, no murmur, click, rub or gallop GI: soft, non-tender; bowel sounds normal; no masses,  no organomegaly Extremities: erthema and wound noted left 2nd toe. No induration.  Pedal [id number] 1+ bilateral LE. Pulses: poorly palpable in LE Skin: wound left 2nd toe Neurologic: No focal deficits.  Lab Results:  Basic Metabolic Panel:  Recent Labs Lab 06/12/13 0035 06/12/13 0500  NA 132* 135*  K 3.9 4.0  CL 97 104  CO2 21 17*  GLUCOSE 267* 208*  BUN 35* 32*  CREATININE 1.39* 1.28*  CALCIUM 8.6 8.0*   CBC:  Recent Labs Lab 06/12/13 0035 06/12/13 0500  WBC 7.1 6.7  NEUTROABS 4.4  --   HGB 9.2* 8.3*  HCT 28.3* 26.0*  MCV 72.8* 73.7*  PLT 511* 395   CBG:  Recent Labs Lab 06/12/13 0738  GLUCAP 197*    Recent Results (from the past 240 hour(s))  MRSA PCR SCREENING     Status: Abnormal   Collection Time    06/12/13  4:26 AM      Result Value Ref Range Status   MRSA by PCR POSITIVE (*) NEGATIVE Final   Comment:            The GeneXpert MRSA Assay (FDA     approved for NASAL specimens     only), is one component of a     comprehensive MRSA colonization     surveillance program. It is not     intended to diagnose MRSA     infection nor to guide or     monitor treatment for     MRSA infections.  RESULT CALLED TO, READ BACK BY AND VERIFIED WITH:     Chevis Pretty RN AT 0630 ON 03.13.15 BY SHUEA      Studies/Results: Dg Toe 2nd Left  06/11/2013   CLINICAL DATA:  Diabetic foot  EXAM: LEFT SECOND TOE  COMPARISON:  None.  FINDINGS: There is cortical erosion of the distal portion of the second proximal phalanx and throughout most of the second middle phalanx consistent with osteomyelitis. No fracture.  Mild degenerative change in the first MTP.  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   Electronically Signed   By: Franchot Gallo M.D.   On: 06/11/2013 10:03    Medications:  Scheduled: . Chlorhexidine Gluconate Cloth  6 each Topical Q0600  . insulin aspart  0-9 Units Subcutaneous TID WC  . insulin glargine  9 Units Subcutaneous BID  . losartan  25 mg Oral Daily  . mupirocin ointment   Nasal BID  .  piperacillin-tazobactam (ZOSYN)  IV  3.375 g Intravenous Q8H  . simvastatin  10 mg Oral q1800  . sodium chloride  3 mL Intravenous Q12H  . vancomycin  500 mg Intravenous Q12H   Continuous:  [id number], acetaminophen, albuterol, ondansetron (ZOFRAN) IV, ondansetron  Assessment/Plan:  Principal Problem:   Osteomyelitis Active Problems:   Iron deficiency anemia, unspecified   Type I (juvenile type) diabetes mellitus without mention of complication, uncontrolled   CKD (chronic kidney disease)    Likely Osteomyelitis of the left foot second toe Patient's third and fourth toe also looks infected. MRI pending. Ortho consult called. Continue vancomycin and Zosyn for now. Follow blood cultures.   Diabetes mellitus type 1 Lantus plus SSI. Monitor closely. Patient admits to dietary indiscretion. Dietitian consult. A1C was 11.3 in January.   Hypertension Continue home medications and closely follow metabolic panel. Patient is on ARB and history of chronic kidney disease.   Chronic kidney disease See above. Creatinine stable. Will need to see nephrology as OP.  Chronic anemia Stable. Closely follow CBC.   Hyperlipidemia Continue statins.   Peripheral edema Probably from nephrotic syndrome as per the endocrinologist. Dopplers to rule out DVT. Patient states she has tried Lasix last month but was discontinued due to worsening creatinine. Needs OP follow up with Nephrology.  Code Status: Full Code  DVT Prophylaxis: SCD's    Family Communication: Discussed with patient  Disposition Plan: Not ready for discharge    LOS: 1 day   [place] [place] [facility] Pager [phone] 06/12/2013, 9:56 AM  If 8PM-8AM, please contact night-coverage at [email or web address], password [facility]

## 2013-06-12 NOTE — ED Provider Notes (Signed)
CSN: [id number]     Arrival date & time 06/11/13  2210 History   First MD Initiated Contact with Patient 06/11/13 2334     Chief Complaint  Patient presents with  . Foot Pain   HPI  History provided by the patient. Patient is a 43 year old female with history of diabetes and peripheral neuropathy who presents with worsened the left chronic foot and toe infection. Patient has had a long history of problems and difficulties to her left foot including previous wounds and injuries. Today she reports that she has been dealing with infections around her second toe and other toes of her foot for the past one month. She has been following up at the wound clinic. Today she went for additional checkup and had x-rays performed. She states she has had worsening swelling and drainage from her second toe and her wound doctor informed her that they were concerned about her infection and that she will likely need amputation. She was given new prescriptions for Cipro and linezolid to take for her infection which she did start today. She is worried however and thinks that she needs to be admitted to the hospital for IV antibiotics so that she might possibly save her toe. She denies any associated fever, chills or sweats. No nausea or vomiting. She does report some fluctuations of her blood sugar reaching into the 400s at times. She has been using her insulin to adjust for this. No other complaints.     Past Medical History  Diagnosis Date  . Diabetes mellitus   . Neuropathy in diabetes   . Hypertension   . Hypercholesteremia   . H/O seasonal allergies   . Anemia   . Left foot infection    Past Surgical History  Procedure Laterality Date  . Cesarean section    . Eye surgery    . Hemrrhoidectomy    . Peripherally inserted central catheter insertion     History reviewed. No pertinent family history. History  Substance Use Topics  . Smoking status: Former Smoker    Quit date: 05/16/2008  . Smokeless  tobacco: Never Used  . Alcohol Use: 0.5 oz/week    1 drink(s) per week     Comment: Occasional   OB History   Grav Para Term Preterm Abortions TAB SAB Ect Mult Living                 Review of Systems  Constitutional: Negative for fever, chills and diaphoresis.  Gastrointestinal: Negative for nausea and vomiting.  All other systems reviewed and are negative.      Allergies  Ciprofloxacin and Cleocin  Home Medications   Current Outpatient Rx  Name  Route  Sig  Dispense  Refill  . albuterol (PROVENTIL HFA;VENTOLIN HFA) 108 (90 BASE) MCG/ACT inhaler   Inhalation   Inhale 2 puffs into the lungs every 6 (six) hours as needed. S.O.B.         . ciprofloxacin (CIPRO) 500 MG tablet   Oral   Take 500 mg by mouth 2 (two) times daily.         . insulin glargine (LANTUS) 100 UNIT/ML injection   Subcutaneous   Inject 9-16 Units into the skin 2 (two) times daily. Take 16 units in am and 9 units at night         . insulin lispro (HUMALOG) 100 UNIT/ML injection   Subcutaneous   Inject 2-7 Units into the skin 3 (three) times daily before meals. Pt on sliding  scale         . linezolid (ZYVOX) 600 MG tablet   Oral   Take 600 mg by mouth 2 (two) times daily.         . pravastatin (PRAVACHOL) 20 MG tablet   Oral   Take 20 mg by mouth daily.          BP 152/83  Pulse 87  Temp(Src) 98.1 F (36.7 C)  Resp 16  Ht 5' 7.5" (1.715 m)  Wt 138 lb (62.596 kg)  BMI 21.28 kg/m2  SpO2 100%  LMP 05/21/2013 Physical Exam  Nursing note and vitals reviewed. Constitutional: She is oriented to person, place, and time. She appears well-developed and well-nourished. No distress.  HENT:  Head: Normocephalic.  Cardiovascular: Normal rate and regular rhythm.   No murmur heard. Pulmonary/Chest: Effort normal and breath sounds normal. No respiratory distress. She has no wheezes. She has no rales.  Abdominal: Soft.  Musculoskeletal:  Erythema of the left second toe with diffuse  swelling. There is drainage between the first and second toe as well as between the spacing of the additional toes of the left foot. No significant erythema of the mid foot. No erythematous streaks. Normal dorsal pedal pulses. Decreased sensation at baseline per patient  Neurological: She is alert and oriented to person, place, and time.  Skin: Skin is warm and dry. No rash noted.  Psychiatric: She has a normal mood and affect. Her behavior is normal.    ED Course  Procedures   DIAGNOSTIC STUDIES: Oxygen Saturation is 100% on room air    COORDINATION OF CARE:  Nursing notes reviewed. Vital signs reviewed. Initial pt interview and examination performed.   12:24 AM-patient seen and evaluated. She appears well no acute distress. She is afebrile. Does not appear severely ill or toxic. She is sitting comfortably working on her computer in bed. Discussed work up plan with pt at bedside, which includes lab tests. Pt agrees with plan.  Spoke with Attending physician.  Given findings of osteomyelitis on x-ray with plan to admit.  Spoke with [facility] hospitalist.  They will see pt and admit.   Treatment plan initiated: Medications  sodium chloride 0.9 % bolus 1,000 mL (not administered)   Results for orders placed during the hospital encounter of 06/11/13  CBC WITH DIFFERENTIAL      Result Value Ref Range   WBC 7.1  4.0 - 10.5 K/uL   RBC 3.89  3.87 - 5.11 MIL/uL   Hemoglobin 9.2 (*) 12.0 - 15.0 g/dL   HCT 28.3 (*) 36.0 - 46.0 %   MCV 72.8 (*) 78.0 - 100.0 fL   MCH 23.7 (*) 26.0 - 34.0 pg   MCHC 32.5  30.0 - 36.0 g/dL   RDW 18.7 (*) 11.5 - 15.5 %   Platelets 511 (*) 150 - 400 K/uL   Neutrophils Relative % 62  43 - 77 %   Neutro Abs 4.4  1.7 - 7.7 K/uL   Lymphocytes Relative 24  12 - 46 %   Lymphs Abs 1.7  0.7 - 4.0 K/uL   Monocytes Relative 12  3 - 12 %   Monocytes Absolute 0.9  0.1 - 1.0 K/uL   Eosinophils Relative 1  0 - 5 %   Eosinophils Absolute 0.1  0.0 - 0.7 K/uL   Basophils  Relative 0  0 - 1 %   Basophils Absolute 0.0  0.0 - 0.1 K/uL  BASIC METABOLIC PANEL      Result  Value Ref Range   Sodium 132 (*) 137 - 147 mEq/L   Potassium 3.9  3.7 - 5.3 mEq/L   Chloride 97  96 - 112 mEq/L   CO2 21  19 - 32 mEq/L   Glucose, Bld 267 (*) 70 - 99 mg/dL   BUN 35 (*) 6 - 23 mg/dL   Creatinine, Ser 1.39 (*) 0.50 - 1.10 mg/dL   Calcium 8.6  8.4 - 10.5 mg/dL   GFR calc non Af Amer 46 (*) >90 mL/min   GFR calc Af Amer 54 (*) >90 mL/min      Imaging Review Dg Toe 2nd Left  06/11/2013   CLINICAL DATA:  Diabetic foot  EXAM: LEFT SECOND TOE  COMPARISON:  None.  FINDINGS: There is cortical erosion of the distal portion of the second proximal phalanx and throughout most of the second middle phalanx consistent with osteomyelitis. No fracture.  Mild degenerative change in the first MTP.  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   Electronically Signed   By: Franchot Gallo M.D.   On: 06/11/2013 10:03     MDM   Final diagnoses:  Osteomyelitis        Martie Lee, PA-C 06/12/13 [phone]

## 2013-06-12 NOTE — Progress Notes (Signed)
Received a call from Dr. Jess Barters office, spoke with Autum ( MD's Asst.) stated that MD will not do surgery tonight and ordered to resume patient's diet .

## 2013-06-12 NOTE — Plan of Care (Signed)
I have reviewed and agree with nutrition education note completed by Claudell Kyle, DI  [facility] [place] Clinical Dietitian [id number]

## 2013-06-12 NOTE — Progress Notes (Signed)
ANTIBIOTIC CONSULT NOTE - INITIAL  Pharmacy Consult for Vancomycin/Zosyn Indication: Osteomyelitis  Allergies  Allergen Reactions  . Ciprofloxacin Itching  . Cleocin [Clindamycin Hcl]     Diarrhea     Patient Measurements: Height: 5\' 8"  (172.7 cm) Weight: 136 lb 14.4 oz (62.097 kg) IBW/kg (Calculated) : 63.9   Vital Signs: Temp: 98.2 F (36.8 C) (03/13 0300) Temp src: Oral (03/13 0300) BP: 153/79 mmHg (03/13 0300) Pulse Rate: 86 (03/13 0300) Intake/Output from previous day:   Intake/Output from this shift:    Labs:  Recent Labs  06/12/13 0035  WBC 7.1  HGB 9.2*  PLT 511*  CREATININE 1.39*   Estimated Creatinine Clearance: 52.2 ml/min (by C-G formula based on Cr of 1.39). No results found for this basename: VANCOTROUGH, VANCOPEAK, VANCORANDOM, GENTTROUGH, GENTPEAK, GENTRANDOM, TOBRATROUGH, TOBRAPEAK, TOBRARND, AMIKACINPEAK, AMIKACINTROU, AMIKACIN,  in the last 72 hours   Microbiology: No results found for this or any previous visit (from the past 720 hour(s)).  Medical History: Past Medical History  Diagnosis Date  . Diabetes mellitus   . Neuropathy in diabetes   . Hypertension   . Hypercholesteremia   . H/O seasonal allergies   . Anemia   . Left foot infection     Assessment: 43 yo F with hx DM, chronic kidney disease, hypertension, hyperlipedemia, chronic anemia with ulcers on her toes x 1 month.  Pt f/u with wound clinic who started her on Cipro and Zyvox 3/12 (she took x 1 day), now MD ordering Vancomycin and Zosyn per Rx for osteomyelitis.  Goal of Therapy:  Vancomycin trough level 15-20 mcg/ml  Plan:   Zosyn 3.375Gm IV q8h EI infusion  Vancomycin 750mg  x1 then 500mg  IV q12h  F/U Scr/levels/cultures as needed.  Lawana Pai R 06/12/2013,5:22 AM

## 2013-06-12 NOTE — Progress Notes (Signed)
Writer received call from lab -- pt's MRSA swab is POSITIVE. Protocol orders started.

## 2013-06-12 NOTE — Consult Note (Signed)
Reason for Consult: Cellulitis toes 2, 3 and 4 left foot Referring Physician: Dr Jeani Sow is an 43 y.o. female.  HPI: Patient is a 43 year old woman well known to me who has been followed at the wound center with Dr. Dellia Nims with local wound care and debridement. She states after extensive the treatment Dr. Dellia Nims had recommended amputation surgery.  Past Medical History  Diagnosis Date  . Diabetes mellitus   . Neuropathy in diabetes   . Hypertension   . Hypercholesteremia   . H/O seasonal allergies   . Anemia   . Left foot infection     Past Surgical History  Procedure Laterality Date  . Cesarean section    . Eye surgery    . Hemrrhoidectomy    . Peripherally inserted central catheter insertion      History reviewed. No pertinent family history.  Social History:  reports that she quit smoking about 5 years ago. She has never used smokeless tobacco. She reports that she drinks about 0.5 ounces of alcohol per week. She reports that she does not use illicit drugs.  Allergies:  Allergies  Allergen Reactions  . Ciprofloxacin Itching  . Cleocin [Clindamycin Hcl]     Diarrhea     Medications: I have reviewed the patient's current medications.  Results for orders placed during the hospital encounter of 06/11/13 (from the past 48 hour(s))  CBC WITH DIFFERENTIAL     Status: Abnormal   Collection Time    06/12/13 12:35 AM      Result Value Ref Range   WBC 7.1  4.0 - 10.5 K/uL   RBC 3.89  3.87 - 5.11 MIL/uL   Hemoglobin 9.2 (*) 12.0 - 15.0 g/dL   HCT 28.3 (*) 36.0 - 46.0 %   MCV 72.8 (*) 78.0 - 100.0 fL   MCH 23.7 (*) 26.0 - 34.0 pg   MCHC 32.5  30.0 - 36.0 g/dL   RDW 18.7 (*) 11.5 - 15.5 %   Platelets 511 (*) 150 - 400 K/uL   Neutrophils Relative % 62  43 - 77 %   Neutro Abs 4.4  1.7 - 7.7 K/uL   Lymphocytes Relative 24  12 - 46 %   Lymphs Abs 1.7  0.7 - 4.0 K/uL   Monocytes Relative 12  3 - 12 %   Monocytes Absolute 0.9  0.1 - 1.0 K/uL    Eosinophils Relative 1  0 - 5 %   Eosinophils Absolute 0.1  0.0 - 0.7 K/uL   Basophils Relative 0  0 - 1 %   Basophils Absolute 0.0  0.0 - 0.1 K/uL  BASIC METABOLIC PANEL     Status: Abnormal   Collection Time    06/12/13 12:35 AM      Result Value Ref Range   Sodium 132 (*) 137 - 147 mEq/L   Potassium 3.9  3.7 - 5.3 mEq/L   Chloride 97  96 - 112 mEq/L   CO2 21  19 - 32 mEq/L   Glucose, Bld 267 (*) 70 - 99 mg/dL   BUN 35 (*) 6 - 23 mg/dL   Creatinine, Ser 1.39 (*) 0.50 - 1.10 mg/dL   Calcium 8.6  8.4 - 10.5 mg/dL   GFR calc non Af Amer 46 (*) >90 mL/min   GFR calc Af Amer 54 (*) >90 mL/min   Comment: (NOTE)     The eGFR has been calculated using the CKD EPI equation.     This calculation  has not been validated in all clinical situations.     eGFR's persistently <90 mL/min signify possible Chronic Kidney     Disease.  SEDIMENTATION RATE     Status: Abnormal   Collection Time    06/12/13 12:35 AM      Result Value Ref Range   Sed Rate 75 (*) 0 - 22 mm/hr  C-REACTIVE PROTEIN     Status: Abnormal   Collection Time    06/12/13  2:20 AM      Result Value Ref Range   CRP 2.4 (*) <0.60 mg/dL   Comment: Performed at [facility] PCR SCREENING     Status: Abnormal   Collection Time    06/12/13  4:26 AM      Result Value Ref Range   MRSA by PCR POSITIVE (*) NEGATIVE   Comment:            The GeneXpert MRSA Assay (FDA     approved for NASAL specimens     only), is one component of a     comprehensive MRSA colonization     surveillance program. It is not     intended to diagnose MRSA     infection nor to guide or     monitor treatment for     MRSA infections.     RESULT CALLED TO, READ BACK BY AND VERIFIED WITH:     Chevis Pretty RN AT 0630 ON 03.13.15 BY SHUEA  BASIC METABOLIC PANEL     Status: Abnormal   Collection Time    06/12/13  5:00 AM      Result Value Ref Range   Sodium 135 (*) 137 - 147 mEq/L   Potassium 4.0  3.7 - 5.3 mEq/L   Chloride 104  96 - 112  mEq/L   CO2 17 (*) 19 - 32 mEq/L   Glucose, Bld 208 (*) 70 - 99 mg/dL   BUN 32 (*) 6 - 23 mg/dL   Creatinine, Ser 1.28 (*) 0.50 - 1.10 mg/dL   Calcium 8.0 (*) 8.4 - 10.5 mg/dL   GFR calc non Af Amer 51 (*) >90 mL/min   GFR calc Af Amer 59 (*) >90 mL/min   Comment: (NOTE)     The eGFR has been calculated using the CKD EPI equation.     This calculation has not been validated in all clinical situations.     eGFR's persistently <90 mL/min signify possible Chronic Kidney     Disease.  CBC     Status: Abnormal   Collection Time    06/12/13  5:00 AM      Result Value Ref Range   WBC 6.7  4.0 - 10.5 K/uL   RBC 3.53 (*) 3.87 - 5.11 MIL/uL   Hemoglobin 8.3 (*) 12.0 - 15.0 g/dL   HCT 26.0 (*) 36.0 - 46.0 %   MCV 73.7 (*) 78.0 - 100.0 fL   MCH 23.5 (*) 26.0 - 34.0 pg   MCHC 31.9  30.0 - 36.0 g/dL   RDW 18.7 (*) 11.5 - 15.5 %   Platelets 395  150 - 400 K/uL  GLUCOSE, CAPILLARY     Status: Abnormal   Collection Time    06/12/13  7:38 AM      Result Value Ref Range   Glucose-Capillary 197 (*) 70 - 99 mg/dL  GLUCOSE, CAPILLARY     Status: Abnormal   Collection Time    06/12/13 12:26 PM  Result Value Ref Range   Glucose-Capillary 131 (*) 70 - 99 mg/dL  PREGNANCY, URINE     Status: None   Collection Time    06/12/13 12:43 PM      Result Value Ref Range   Preg Test, Ur NEGATIVE  NEGATIVE   Comment:            THE SENSITIVITY OF THIS     METHODOLOGY IS >20 mIU/mL.    Mr Foot Left Wo Contrast  06/12/2013   CLINICAL DATA:  Soft tissue ulceration of the second toe of the left foot. Abnormal radiographs.  EXAM: MRI OF THE LEFT FOREFOOT WITHOUT CONTRAST  TECHNIQUE: Multiplanar, multisequence MR imaging was performed. No intravenous contrast was administered.  COMPARISON:  None.  FINDINGS: There is abnormal edema in the phalangeal bones of the second third and fourth toes without bone destruction. Edema is most intense around the PIP joint of the second toe. There is slight degenerative  changes of the first metatarsal phalangeal joint.  There is also subcutaneous edema in the second, third and fourth toes but most severe in the second toe. No appreciable joint effusions. Air subcutaneous edema on the dorsum of the foot.  IMPRESSION: 1. Cellulitis of the second third and fourth toes of the left foot. 2. Edema in the bones of those toes without bone destruction or joint effusion. This edema could be secondary to hyperemia due to the cellulitis. The findings are not definitive for osteomyelitis.   Electronically Signed   By: Rozetta Nunnery M.D.   On: 06/12/2013 11:34   Dg Toe 2nd Left  06/11/2013   CLINICAL DATA:  Diabetic foot  EXAM: LEFT SECOND TOE  COMPARISON:  None.  FINDINGS: There is cortical erosion of the distal portion of the second proximal phalanx and throughout most of the second middle phalanx consistent with osteomyelitis. No fracture.  Mild degenerative change in the first MTP.  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   Electronically Signed   By: Franchot Gallo M.D.   On: 06/11/2013 10:03    Review of Systems  All other systems reviewed and are negative.   Blood pressure 130/71, pulse 76, temperature 98.2 F (36.8 C), temperature source Oral, resp. rate 20, height $RemoveBe'5\' 8"'KdhYKwldK$  (1.727 m), weight 62.097 kg (136 lb 14.4 oz), last menstrual period 05/21/2013, SpO2 100.00%. Physical Exam On examination patient is alert oriented no adenopathy no a sending cellulitis of the foot or ankle. She has a palpable dorsalis pedis pulse. She has ulcerations both medially and laterally to the second toe on the left foot as well as an ulcer medially on the fourth toe. They're very small ulcer on the third toe. Bone is palpable in the wound on the second toe and fourth toe. Review of the MRI scan shows no definite osteomyelitis but she does have cellulitis on toes 23 and 4 by MRI scan and I believe that the bone exposure is probably a acute  osteomyelitis. Assessment/Plan: Assessment: Acute osteomyelitis second and fourth toe left foot with cellulitis of toes 23 and 4.  Plan: I feel that it would be appropriate to proceed with IV antibiotics for the weekend discharge to home with doxycycline on Monday. I will followup in the office this next week discussed that she most likely would require an amputation of toes 2 and 4 however patient is extremely resistant to amputation type surgery I discussed that we can evaluate this as an outpatient that we did not have  to be aggressive with amputation surgery at this time. She is not at risk of life or limb at this time.  Doristine Shehan V 06/12/2013, 4:40 PM

## 2013-06-12 NOTE — ED Provider Notes (Signed)
Shared service with midlevel provider. I have personally seen and examined the patient, providing direct face to face care, presenting with the chief complaint of toe infection. Physical exam findings include wound in her left foot, with drainage, Xrays show osteomyelitis . Plan will be admission for ivab and possible amputation. I have reviewed the nursing documentation on past medical history, family history, and social history.   Varney Biles, MD 06/12/13 [phone]

## 2013-06-12 NOTE — H&P (Signed)
[facility] History and Physical  [name] [id number] DOB: Oct 04, 1970 DOA: 06/11/2013  Referring physician: ER physician. PCP: Luetta Nutting, DO   Chief Complaint: Left second toe wound.  HPI: [name] is a 43 y.o. female with history of diabetes mellitus type 1, chronic kidney disease, hypertension, hyperlipidemia, chronic anemia started experiencing ulcers and her left toes over the last one month. She has been following up with wound clinic. There was increasing discharge from the wounds and x-rays done yesterday showed possible osteomyelitis of the second 2 toe and patient was started on antibiotics. Since patient is concerned about her wound she presented to the ER. Patient has been admitted for IV antibiotics and further workup. Denies any fever chills or pain. Patient states her blood sugar has been very labile and has been followed by the endocrinologist. She also was recently told that she may be having nephrotic syndrome for her lower extremity edema. She was started on Lasix but due to worsening creatinine was advised to stop. Patient otherwise denies any chest pain or shortness of breath.  Review of Systems: As presented in the history of presenting illness, rest negative.  Past Medical History  Diagnosis Date  . Diabetes mellitus   . Neuropathy in diabetes   . Hypertension   . Hypercholesteremia   . H/O seasonal allergies   . Anemia   . Left foot infection    Past Surgical History  Procedure Laterality Date  . Cesarean section    . Eye surgery    . Hemrrhoidectomy    . Peripherally inserted central catheter insertion     Social History:  reports that she quit smoking about 5 years ago. She has never used smokeless tobacco. She reports that she drinks about 0.5 ounces of alcohol per week. She reports that she does not use illicit drugs. Where does patient live home. Can patient participate in ADLs? Yes.  Allergies  Allergen Reactions  .  Ciprofloxacin Itching  . Cleocin [Clindamycin Hcl]     Diarrhea     Family History: History reviewed. No pertinent family history.    Prior to Admission medications   Medication Sig Start Date End Date Taking? Authorizing Provider  albuterol (PROVENTIL HFA;VENTOLIN HFA) 108 (90 BASE) MCG/ACT inhaler Inhale 2 puffs into the lungs every 6 (six) hours as needed. S.O.B.   Yes Historical Provider, MD  ciprofloxacin (CIPRO) 500 MG tablet Take 500 mg by mouth 2 (two) times daily.   Yes Historical Provider, MD  insulin glargine (LANTUS) 100 UNIT/ML injection Inject 9-16 Units into the skin 2 (two) times daily. Take 16 units in am and 9 units at night 03/27/13  Yes Robbie Lis, MD  insulin lispro (HUMALOG) 100 UNIT/ML injection Inject 2-7 Units into the skin 3 (three) times daily before meals. Pt on sliding scale   Yes Historical Provider, MD  linezolid (ZYVOX) 600 MG tablet Take 600 mg by mouth 2 (two) times daily.   Yes Historical Provider, MD  pravastatin (PRAVACHOL) 20 MG tablet Take 20 mg by mouth daily.   Yes Historical Provider, MD  EPINEPHrine (EPIPEN) 0.3 mg/0.3 mL SOAJ injection Inject 0.3 mLs (0.3 mg total) into the muscle as needed. 06/12/13   Varney Biles, MD    Physical Exam: Filed Vitals:   06/11/13 2235 06/12/13 0234  BP: 152/83 149/63  Pulse: 87 80  Temp: 98.1 F (36.7 C) 98 F (36.7 C)  TempSrc:  Oral  Resp: 16 18  Height: 5' 7.5" (1.715 m)  Weight: 62.596 kg (138 lb)   SpO2: 100% 100%     General:  Well-developed well-nourished.  Eyes: Anicteric no pallor.  ENT: No discharge from the ears eyes nose mouth.  Neck: No mass felt.  Cardiovascular: S1-S2 heard.  Respiratory: No rhonchi or crepitations.  Abdomen: Soft nontender bowel sounds present.  Skin: There is ulcers on her 3 middle toes on either side of the left foot.  Musculoskeletal: Edema of both lower extremities.  Psychiatric: Appears normal.  Neurologic: Alert awake and oriented to time place  and person. Moves all extremities.  Labs on Admission:  Basic Metabolic Panel:  Recent Labs Lab 06/12/13 0035  NA 132*  K 3.9  CL 97  CO2 21  GLUCOSE 267*  BUN 35*  CREATININE 1.39*  CALCIUM 8.6   Liver Function Tests: No results found for this basename: AST, ALT, ALKPHOS, BILITOT, PROT, ALBUMIN,  in the last 168 hours No results found for this basename: LIPASE, AMYLASE,  in the last 168 hours No results found for this basename: AMMONIA,  in the last 168 hours CBC:  Recent Labs Lab 06/12/13 0035  WBC 7.1  NEUTROABS 4.4  HGB 9.2*  HCT 28.3*  MCV 72.8*  PLT 511*   Cardiac Enzymes: No results found for this basename: CKTOTAL, CKMB, CKMBINDEX, TROPONINI,  in the last 168 hours  BNP (last 3 results) No results found for this basename: PROBNP,  in the last 8760 hours CBG: No results found for this basename: GLUCAP,  in the last 168 hours  Radiological Exams on Admission: Dg Toe 2nd Left  06/11/2013   CLINICAL DATA:  Diabetic foot  EXAM: LEFT SECOND TOE  COMPARISON:  None.  FINDINGS: There is cortical erosion of the distal portion of the second proximal phalanx and throughout most of the second middle phalanx consistent with osteomyelitis. No fracture.  Mild degenerative change in the first MTP.  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   Electronically Signed   By: Franchot Gallo M.D.   On: 06/11/2013 10:03     Assessment/Plan Principal Problem:   Osteomyelitis Active Problems:   Iron deficiency anemia, unspecified   Type I (juvenile type) diabetes mellitus without mention of complication, uncontrolled   CKD (chronic kidney disease)   1. Osteomyelitis of the left foot second toe - patient's third and fourth toe also looks infected. Have ordered MRI of the left foot and place patient on vancomycin and Zosyn for now. Consult orthopedic surgeon in a.m. Follow blood cultures. 2. Diabetes mellitus type 1 - and have continued patient's home  medications with sliding-scale coverage. Closely follow CBGs as patient has labile diabetes. 3. Hypertension - continue home medications closely follow metabolic panel. Patient is on ARB and history of chronic kidney disease. 4. Chronic kidney disease - see #3. 5. Chronic anemia - closely follow CBC. 6. Hyperlipidemia - continue statins. 7. Peripheral edema - probably from nephrotic syndrome as per the endocrinologist. I have ordered Dopplers to rule out DVT. Patient states she has tried Lasix last month but was discontinued due to worsening creatinine.  I have reviewed patient's old charts and labs.  Code Status: Full code.  Family Communication: None.  Disposition Plan: Admit to inpatient.    Prentiss Polio N. [facility] Pager [phone].  If 7PM-7AM, please contact night-coverage [email or web address] Password [facility] 06/12/2013, 3:56 AM

## 2013-06-12 NOTE — Plan of Care (Signed)
Problem: Food- and Nutrition-Related Knowledge Deficit (NB-1.1) Goal: Nutrition education Formal process to instruct or train a patient/client in a skill or to impart knowledge to help patients/clients voluntarily manage or modify food choices and eating behavior to maintain or improve health. Outcome: Completed/Met Date Met:  06/12/13 Nutrition Education Note  Dietetic intern consulted for nutrition education regarding heart healthy/diabetes diet education.  Dietetic intern provided "High Cholesterol Nutrition Therapy," "Hight Triglyceride Nutrition Therapy," and "Type 1 Diabetes Nutrition Therapy" from the Academy of Nutrition and Dietetics. Reviewed patient's dietary recall. Provided examples on ways to decrease saturated fat intake in diet. Discouraged intake of foods high in saturated fat and trans fat. Encouraged intake of fruits, vegetables, and whole grains. Also encouraged patient to spread out meals and snacks throughout the day.   Patient explained that she is already following a healthy diet and does not eat fatty foods and only eats fruits and vegetables. Patient also reported eating one meal per day, but she likes to graze on foods prepared at the [employer] where she works. Patient reported that her labs are abnormal despite her dietary efforts. Patient was receptive to diet education, but felt that she is already follow recommendations. Teach back method used.  Expect fair compliance.  Body mass index is 20.82 kg/(m^2). Patient meets criteria for normal weight based on current BMI.     Lab Results  Component Value Date    HGBA1C 11.3* 04/08/2013   Lipid Panel     Component Value Date/Time    CHOL 214* 05/25/2013 1129    TRIG 337.0* 05/25/2013 1129    HDL 31.00* 05/25/2013 1129    CHOLHDL 7 05/25/2013 1129    VLDL 67.4* 05/25/2013 1129    LDLCALC 105* 11/16/2010 0645   Current diet order is carb modified, patient currently has no POs recorded as patient was recently NPO. Labs and  medications reviewed. No further nutrition interventions warranted at this time. If additional nutrition issues arise, please re-consult RD.  Claudell Kyle, Dietetic Intern Pager: [phone]

## 2013-06-12 NOTE — Progress Notes (Signed)
  Results for [name], [name] (MRN [id number]) as of 06/12/2013 12:30  Ref. Range 06/02/2013 00:12 06/12/2013 07:38 06/12/2013 12:26  Glucose-Capillary Latest Range: 70-99 mg/dL 350 (H) 197 (H) 131 (H)    Patient admitted with possible osteomyelitis in left 2nd toe, MRSA positive, CKD. Has Type 1 DM per history. Insulin taken at home: Lantus 16 units every AM, 9 units every PM, Novolog 2-7 units TID. Fasting CBG today was 197 mg/dl. HgbA1C in January, 2015 was 11.3% which was down from A1C in December, 2014 of 13.5%. May need more Lantus if CBGs continue to be greater than 180 mg/dl. Will follow while in hospital. Harvel Ricks RN BSN CDE

## 2013-06-12 NOTE — Progress Notes (Signed)
*  PRELIMINARY RESULTS* Vascular Ultrasound Lower extremity venous duplex has been completed.  Preliminary findings: No evidence of DVT. Bilateral baker's cyst noted. Left inguinal lymph node is enlarged.   Landry Mellow, RDMS, RVT  06/12/2013, 10:28 AM

## 2013-06-13 DIAGNOSIS — E1029 Type 1 diabetes mellitus with other diabetic kidney complication: Secondary | ICD-10-CM | POA: Diagnosis present

## 2013-06-13 DIAGNOSIS — D638 Anemia in other chronic diseases classified elsewhere: Secondary | ICD-10-CM

## 2013-06-13 LAB — GLUCOSE, CAPILLARY
GLUCOSE-CAPILLARY: 431 mg/dL — AB (ref 70–99)
GLUCOSE-CAPILLARY: 293 mg/dL — AB (ref 70–99)
GLUCOSE-CAPILLARY: 257 mg/dL — AB (ref 70–99)
Glucose-Capillary: 450 mg/dL — ABNORMAL HIGH (ref 70–99)
Glucose-Capillary: 366 mg/dL — ABNORMAL HIGH (ref 70–99)
Glucose-Capillary: 317 mg/dL — ABNORMAL HIGH (ref 70–99)
Glucose-Capillary: 265 mg/dL — ABNORMAL HIGH (ref 70–99)
Glucose-Capillary: 340 mg/dL — ABNORMAL HIGH (ref 70–99)

## 2013-06-13 LAB — BASIC METABOLIC PANEL
BUN: 30 mg/dL — ABNORMAL HIGH (ref 6–23)
CO2: 20 meq/L (ref 19–32)
Calcium: 8 mg/dL — ABNORMAL LOW (ref 8.4–10.5)
Chloride: 105 mEq/L (ref 96–112)
Creatinine, Ser: 1.32 mg/dL — ABNORMAL HIGH (ref 0.50–1.10)
GFR calc Af Amer: 57 mL/min — ABNORMAL LOW (ref >90)
GFR calc non Af Amer: 49 mL/min — ABNORMAL LOW (ref >90)
GLUCOSE: 295 mg/dL — AB (ref 70–99)
Potassium: 4.5 mEq/L (ref 3.7–5.3)
SODIUM: 135 meq/L — AB (ref 137–147)

## 2013-06-13 LAB — VITAMIN B12: VITAMIN B 12: 337 pg/mL (ref 211–911)

## 2013-06-13 LAB — IRON AND TIBC
Iron: 12 ug/dL — ABNORMAL LOW (ref 42–135)
Saturation Ratios: 4 % — ABNORMAL LOW (ref 20–55)
TIBC: 308 ug/dL (ref 250–470)
UIBC: 296 ug/dL (ref 125–400)

## 2013-06-13 LAB — CBC
HCT: 25.2 % — ABNORMAL LOW (ref 36.0–46.0)
HEMOGLOBIN: 8.1 g/dL — AB (ref 12.0–15.0)
MCH: 24.1 pg — ABNORMAL LOW (ref 26.0–34.0)
MCHC: 32.1 g/dL (ref 30.0–36.0)
MCV: 75 fL — AB (ref 78.0–100.0)
Platelets: 376 10*3/uL (ref 150–400)
RBC: 3.36 MIL/uL — AB (ref 3.87–5.11)
RDW: 19.2 % — ABNORMAL HIGH (ref 11.5–15.5)
WBC: 6.2 10*3/uL (ref 4.0–10.5)

## 2013-06-13 LAB — RETICULOCYTES
RBC.: 3.36 MIL/uL — AB (ref 3.87–5.11)
Retic Count, Absolute: 40.3 10*3/uL (ref 19.0–186.0)
Retic Ct Pct: 1.2 % (ref 0.4–3.1)

## 2013-06-13 LAB — FERRITIN: Ferritin: 6 ng/mL — ABNORMAL LOW (ref 10–291)

## 2013-06-13 LAB — FOLATE: FOLATE: 4.1 ng/mL

## 2013-06-13 MED ORDER — INSULIN GLARGINE 100 UNIT/ML ~~LOC~~ SOLN: 15.0000 [IU] | Freq: Every day | SUBCUTANEOUS | Status: DC

## 2013-06-13 MED ORDER — INSULIN GLARGINE 100 UNIT/ML ~~LOC~~ SOLN
20.0000 [IU] | Freq: Every day | SUBCUTANEOUS | Status: DC
  Administered 2013-06-13 – 2013-06-15 (×3): 20 [IU] via SUBCUTANEOUS
  Filled 2013-06-13 (×3): qty 0.2

## 2013-06-13 MED ORDER — INSULIN GLARGINE 100 UNIT/ML ~~LOC~~ SOLN
10.0000 [IU] | Freq: Every day | SUBCUTANEOUS | Status: DC
  Administered 2013-06-13: 5 [IU] via SUBCUTANEOUS
  Administered 2013-06-14: 10 [IU] via SUBCUTANEOUS
  Filled 2013-06-13 (×3): qty 0.1

## 2013-06-13 MED ORDER — INSULIN ASPART 100 UNIT/ML ~~LOC~~ SOLN
0.0000 [IU] | Freq: Every day | SUBCUTANEOUS | Status: DC
  Administered 2013-06-13 – 2013-06-14 (×2): 4 [IU] via SUBCUTANEOUS

## 2013-06-13 MED ORDER — INSULIN ASPART 100 UNIT/ML ~~LOC~~ SOLN
0.0000 [IU] | Freq: Three times a day (TID) | SUBCUTANEOUS | Status: DC
  Administered 2013-06-14: 5 [IU] via SUBCUTANEOUS
  Administered 2013-06-14: 7 [IU] via SUBCUTANEOUS
  Administered 2013-06-14 – 2013-06-15 (×3): 2 [IU] via SUBCUTANEOUS

## 2013-06-13 NOTE — Progress Notes (Signed)
CBG checked @ [id number] but refused SSI coveraged for CBG 366 stated to give it at 2pm when her lunch tray comes.  CBG rechecked at 1334pm  CBG  371 for 7units coverage but patient refused and said "I already gave myself 4 units  Of insulin so just gave me 4 units for now I don't want to go to coma if you will give me too much". The undersigned asked the patient  where and what kind of Insulin and if I can see it ,Pt replied  " its in my bag and it is Novolog". Novolog Flexipen. Patient reminded and re educated that  Medications  Should be sent home or be sent To our pharmacy for safe keeping and will be returned upon pts discharge. Patient refused for her meds to be sent to pharmacy stated " I'd rather keep it in my bag rather that losing it". Patient seems to be hateful at this time.  MD made aware.

## 2013-06-13 NOTE — Progress Notes (Signed)
[facility] PROGRESS NOTE  [name] [id number] DOB: 22-Jun-1970 DOA: 06/11/2013  PCP: Luetta Nutting, DO  Brief HPI: [name] is a 43 y.o. female with history of diabetes mellitus type 1, chronic kidney disease, hypertension, hyperlipidemia, chronic anemia started experiencing ulcers and her left toes over the last one month. She has been following up with wound clinic. There was increasing discharge from the wounds and x-rays done yesterday showed possible osteomyelitis of the second 2 toe and patient was started on antibiotics and admitted.  Past medical history:  Past Medical History  Diagnosis Date  . Diabetes mellitus   . Neuropathy in diabetes   . Hypertension   . Hypercholesteremia   . H/O seasonal allergies   . Anemia   . Left foot infection     Consultants: Ortho (Dr. Mardelle Matte)  Procedures: None yet  Antibiotics: Vanc 3/13--> Zosyn 3/13-->  Subjective: Patient has no pain in the left foot. No other complaints.   Objective: Vital Signs  Filed Vitals:   06/12/13 0300 06/12/13 1528 06/12/13 2232 06/13/13 0458  BP: 153/79 130/71 152/79 147/77  Pulse: 86 76 84 75  Temp: 98.2 F (36.8 C) 98.2 F (36.8 C) 98.1 F (36.7 C) 98.4 F (36.9 C)  TempSrc: Oral Oral Oral Oral  Resp: 20 20 20 16   Height: 5\' 8"  (1.727 m)     Weight: 62.097 kg (136 lb 14.4 oz)     SpO2: 97% 100% 98% 98%    Intake/Output Summary (Last 24 hours) at 06/13/13 0840 Last data filed at 06/13/13 0500  Gross per 24 hour  Intake    590 ml  Output   2601 ml  Net  -2011 ml   Filed Weights   06/11/13 2235 06/12/13 0300  Weight: 62.596 kg (138 lb) 62.097 kg (136 lb 14.4 oz)    General appearance: alert, cooperative, appears stated age and no distress Resp: clear to auscultation bilaterally Cardio: regular rate and rhythm, S1, S2 normal, no murmur, click, rub or gallop GI: soft, non-tender; bowel sounds normal; no masses,  no organomegaly Extremities: erthema  and wound noted left 2nd toe. No induration. Pedal ede72ma 1+ bilateral LE. Pulses: poorly palpable in LE Skin: wound left 2nd toe Neurologic: No focal deficits.  Lab Results:  Basic Metabolic Panel:  Recent Labs Lab 06/12/13 0035 06/12/13 0500 06/13/13 0450  NA 132* 135* 135*  K 3.9 4.0 4.5  CL 97 104 105  CO2 21 17* 20  GLUCOSE 267* 208* 295*  BUN 35* 32* 30*  CREATININE 1.39* 1.28* 1.32*  CALCIUM 8.6 8.0* 8.0*   CBC:  Recent Labs Lab 06/12/13 0035 06/12/13 0500 06/13/13 0450  WBC 7.1 6.7 6.2  NEUTROABS 4.4  --   --   HGB 9.2* 8.3* 8.1*  HCT 28.3* 26.0* 25.2*  MCV 72.8* 73.7* 75.0*  PLT 511* 395 376   CBG:  Recent Labs Lab 06/12/13 1742 06/12/13 2225 06/12/13 2230 06/13/13 0344 06/13/13 0735  GLUCAP 117* 450* 431* 293* 257*    Recent Results (from the past 240 hour(s))  MRSA PCR SCREENING     Status: Abnormal   Collection Time    06/12/13  4:26 AM      Result Value Ref Range Status   MRSA by PCR POSITIVE (*) NEGATIVE Final   Comment:            The GeneXpert MRSA Assay (FDA     approved for NASAL specimens     only), is one  component of a     comprehensive MRSA colonization     surveillance program. It is not     intended to diagnose MRSA     infection nor to guide or     monitor treatment for     MRSA infections.     RESULT CALLED TO, READ BACK BY AND VERIFIED WITH:     Chevis Pretty RN AT 0630 ON 03.13.15 BY SHUEA      Studies/Results: Mr Foot Left Wo Contrast  06/12/2013   CLINICAL DATA:  Soft tissue ulceration of the second toe of the left foot. Abnormal radiographs.  EXAM: MRI OF THE LEFT FOREFOOT WITHOUT CONTRAST  TECHNIQUE: Multiplanar, multisequence MR imaging was performed. No intravenous contrast was administered.  COMPARISON:  None.  FINDINGS: There is abnormal edema in the phalangeal bones of the second third and fourth toes without bone destruction. Edema is most intense around the PIP joint of the second toe. There is slight  degenerative changes of the first metatarsal phalangeal joint.  There is also subcutaneous edema in the second, third and fourth toes but most severe in the second toe. No appreciable joint effusions. Air subcutaneous edema on the dorsum of the foot.  IMPRESSION: 1. Cellulitis of the second third and fourth toes of the left foot. 2. Edema in the bones of those toes without bone destruction or joint effusion. This edema could be secondary to hyperemia due to the cellulitis. The findings are not definitive for osteomyelitis.   Electronically Signed   By: Rozetta Nunnery M.D.   On: 06/12/2013 11:34   Dg Toe 2nd Left  06/11/2013   CLINICAL DATA:  Diabetic foot  EXAM: LEFT SECOND TOE  COMPARISON:  None.  FINDINGS: There is cortical erosion of the distal portion of the second proximal phalanx and throughout most of the second middle phalanx consistent with osteomyelitis. No fracture.  Mild degenerative change in the first MTP.  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   Electronically Signed   By: Franchot Gallo M.D.   On: 06/11/2013 10:03    Medications:  Scheduled: . Chlorhexidine Gluconate Cloth  6 each Topical Q0600  . insulin aspart  0-9 Units Subcutaneous TID WC  . insulin glargine  15 Units Subcutaneous QHS  . insulin glargine  20 Units Subcutaneous Daily  . losartan  25 mg Oral Daily  . mupirocin ointment   Nasal BID  . piperacillin-tazobactam (ZOSYN)  IV  3.375 g Intravenous Q8H  . simvastatin  10 mg Oral q1800  . sodium chloride  3 mL Intravenous Q12H  . vancomycin  500 mg Intravenous Q12H   Continuous:  [id number], acetaminophen, albuterol, ondansetron (ZOFRAN) IV, ondansetron  Assessment/Plan:  Principal Problem:   Osteomyelitis Active Problems:   Iron deficiency anemia, unspecified   Type I (juvenile type) diabetes mellitus without mention of complication, uncontrolled   CKD (chronic kidney disease)    Likely Osteomyelitis of the left foot second  toe MRI suggests cellulitis rather than osteomyelitis. Ortho input noted. No need for surgery currently. Continue iV antibiotics till Sunday and then discharge on Monday with Doxycycline. Blood cultures negative so far.   Diabetes mellitus type 1 Lantus plus SSI. Monitor closely. Will adjust Am dose. Patient admits to dietary indiscretion. Dietitian consult. A1C was 11.3 in January. Patient not receptive to diabetes education. She has her own preconceived notions and refused lantus last night stating that it would have caused her blood sugar to be very low this  morning. I tried to educate her but she states "doctors mess up her insulin everytime". It was very difficult to have a conversation with her without her getting belligerent. Non compliance will be the main issue at this time.  Hypertension Continue home medications and closely follow metabolic panel. Patient is on ARB and history of chronic kidney disease.   Chronic kidney disease See above. Creatinine stable. Will need to see nephrology as OP.  Chronic anemia Stable.   Hyperlipidemia Continue statins.   Peripheral edema Probably from nephrotic syndrome as per the endocrinologist. Dopplers to rule out DVT. Patient states she has tried Lasix last month but was discontinued due to worsening creatinine. Needs OP follow up with Nephrology.  Code Status: Full Code  DVT Prophylaxis: SCD's    Family Communication: Discussed with patient  Disposition Plan: Discharge 3/16.    LOS: 2 days   [facility] Pager [phone] 06/13/2013, 8:40 AM  If 8PM-8AM, please contact night-coverage at [email or web address], password [facility]

## 2013-06-14 LAB — GLUCOSE, CAPILLARY
GLUCOSE-CAPILLARY: 302 mg/dL — AB (ref 70–99)
Glucose-Capillary: 284 mg/dL — ABNORMAL HIGH (ref 70–99)
Glucose-Capillary: 276 mg/dL — ABNORMAL HIGH (ref 70–99)
Glucose-Capillary: 165 mg/dL — ABNORMAL HIGH (ref 70–99)
Glucose-Capillary: 310 mg/dL — ABNORMAL HIGH (ref 70–99)

## 2013-06-14 LAB — BASIC METABOLIC PANEL
BUN: 36 mg/dL — AB (ref 6–23)
CHLORIDE: 105 meq/L (ref 96–112)
CO2: 19 meq/L (ref 19–32)
Calcium: 8.4 mg/dL (ref 8.4–10.5)
Creatinine, Ser: 1.63 mg/dL — ABNORMAL HIGH (ref 0.50–1.10)
GFR calc Af Amer: 44 mL/min — ABNORMAL LOW (ref >90)
GFR calc non Af Amer: 38 mL/min — ABNORMAL LOW (ref >90)
GLUCOSE: 303 mg/dL — AB (ref 70–99)
POTASSIUM: 4.9 meq/L (ref 3.7–5.3)
Sodium: 136 mEq/L — ABNORMAL LOW (ref 137–147)

## 2013-06-14 MED ORDER — SODIUM CHLORIDE 0.9 % IV SOLN
INTRAVENOUS | Status: AC
  Administered 2013-06-14: 18:00:00 via INTRAVENOUS

## 2013-06-14 MED ORDER — DOXYCYCLINE HYCLATE 100 MG PO TABS
100.0000 mg | ORAL_TABLET | Freq: Two times a day (BID) | ORAL | Status: DC
  Administered 2013-06-14 – 2013-06-15 (×3): 100 mg via ORAL
  Filled 2013-06-14 (×4): qty 1

## 2013-06-14 MED ORDER — LIVING WELL WITH DIABETES BOOK
Freq: Once | Status: AC
  Administered 2013-06-14: 11:00:00
  Filled 2013-06-14: qty 1

## 2013-06-14 NOTE — Progress Notes (Signed)
Patient's CBG elevated last night at 340. Paged NP on call and SSI ordered for patient, but patient refused to take full dose of Lantus if she also had to take SSI. Patient said "I will go into a coma in my sleep." Patient agreed to take 5 u of Lantus rather than the ordered 10 u. NP notified. Will continue to monitor patient.

## 2013-06-14 NOTE — Progress Notes (Signed)
[facility] PROGRESS NOTE  RUSHIE LUDOLPH [id number] DOB: 11-25-1970 DOA: 06/11/2013  PCP: Luetta Nutting, DO  Brief HPI: [name] is a 43 y.o. female with history of diabetes mellitus type 1, chronic kidney disease, hypertension, hyperlipidemia, chronic anemia started experiencing ulcers and her left toes over the last one month. She has been following up with wound clinic. There was increasing discharge from the wounds and x-rays done yesterday showed possible osteomyelitis of the second 2 toe and patient was started on antibiotics and admitted.  Past medical history:  Past Medical History  Diagnosis Date  . Diabetes mellitus   . Neuropathy in diabetes   . Hypertension   . Hypercholesteremia   . H/O seasonal allergies   . Anemia   . Left foot infection     Consultants: Ortho (Dr. Mardelle Matte)  Procedures: None yet  Antibiotics: Vanc 3/13-->3/15 Zosyn 3/13--> Doxycycline 3/15-->  Subjective: Patient has no pain in the left foot. Had many questions about her foot and toes.   Objective: Vital Signs  Filed Vitals:   06/13/13 0458 06/13/13 1443 06/13/13 2114 06/14/13 0532  BP: 147/77 168/85 156/67 165/80  Pulse: 75 82 84 81  Temp: 98.4 F (36.9 C) 97.6 F (36.4 C) 98.3 F (36.8 C) 98.3 F (36.8 C)  TempSrc: Oral Axillary Oral Oral  Resp: 16 20 20 18   Height:      Weight:    61.009 kg (134 lb 8 oz)  SpO2: 98% 100% 97% 97%    Intake/Output Summary (Last 24 hours) at 06/14/13 1047 Last data filed at 06/14/13 0450  Gross per 24 hour  Intake    710 ml  Output   1575 ml  Net   -865 ml   Filed Weights   06/11/13 2235 06/12/13 0300 06/14/13 0532  Weight: 62.596 kg (138 lb) 62.097 kg (136 lb 14.4 oz) 61.009 kg (134 lb 8 oz)    General appearance: alert, cooperative, appears stated age and no distress Resp: clear to auscultation bilaterally Cardio: regular rate and rhythm, S1, S2 normal, no murmur, click, rub or gallop GI: soft, non-tender;  bowel sounds normal; no masses,  no organomegaly Extremities: erthema and wound noted left 2nd toe. No induration. Pedal edema 1+ bilateral LE. Pulses: poorly palpable in LE Skin: wound left 2nd toe Neurologic: No focal deficits.  Lab Results:  Basic Metabolic Panel:  Recent Labs Lab 06/12/13 0035 06/12/13 0500 06/13/13 0450 06/14/13 0450  NA 132* 135* 135* 136*  K 3.9 4.0 4.5 4.9  CL 97 104 105 105  CO2 21 17* 20 19  GLUCOSE 267* 208* 295* 303*  BUN 35* 32* 30* 36*  CREATININE 1.39* 1.28* 1.32* 1.63*  CALCIUM 8.6 8.0* 8.0* 8.4   CBC:  Recent Labs Lab 06/12/13 0035 06/12/13 0500 06/13/13 0450  WBC 7.1 6.7 6.2  NEUTROABS 4.4  --   --   HGB 9.2* 8.3* 8.1*  HCT 28.3* 26.0* 25.2*  MCV 72.8* 73.7* 75.0*  PLT 511* 395 376   CBG:  Recent Labs Lab 06/13/13 1328 06/13/13 1741 06/13/13 2116 06/14/13 0209 06/14/13 0752  GLUCAP 317* 265* 340* 284* 276*    Recent Results (from the past 240 hour(s))  CULTURE, BLOOD (ROUTINE X 2)     Status: None   Collection Time    06/12/13  2:20 AM      Result Value Ref Range Status   Specimen Description BLOOD LEFT HAND   Final   Special Requests BOTTLES DRAWN AEROBIC AND  ANAEROBIC 5CC   Final   Culture  Setup Time     Final   Value: 06/12/2013 08:45     Performed at [facility]   Culture     Final   Value:        BLOOD CULTURE RECEIVED NO GROWTH TO DATE CULTURE WILL BE HELD FOR 5 DAYS BEFORE ISSUING A FINAL NEGATIVE REPORT     Performed at [facility]   Report Status PENDING   Incomplete  CULTURE, BLOOD (ROUTINE X 2)     Status: None   Collection Time    06/12/13  2:28 AM      Result Value Ref Range Status   Specimen Description BLOOD LEFT WRIST   Final   Special Requests BOTTLES DRAWN AEROBIC AND ANAEROBIC 5CC   Final   Culture  Setup Time     Final   Value: 06/12/2013 08:45     Performed at [facility]   Culture     Final   Value:        BLOOD CULTURE RECEIVED NO GROWTH TO DATE CULTURE WILL  BE HELD FOR 5 DAYS BEFORE ISSUING A FINAL NEGATIVE REPORT     Performed at [facility]   Report Status PENDING   Incomplete  MRSA PCR SCREENING     Status: Abnormal   Collection Time    06/12/13  4:26 AM      Result Value Ref Range Status   MRSA by PCR POSITIVE (*) NEGATIVE Final   Comment:            The GeneXpert MRSA Assay (FDA     approved for NASAL specimens     only), is one component of a     comprehensive MRSA colonization     surveillance program. It is not     intended to diagnose MRSA     infection nor to guide or     monitor treatment for     MRSA infections.     RESULT CALLED TO, READ BACK BY AND VERIFIED WITH:     Chevis Pretty RN AT 0630 ON 03.13.15 BY SHUEA      Studies/Results: No results found.  Medications:  Scheduled: . Chlorhexidine Gluconate Cloth  6 each Topical Q0600  . doxycycline  100 mg Oral Q12H  . insulin aspart  0-5 Units Subcutaneous QHS  . insulin aspart  0-9 Units Subcutaneous TID WC  . insulin glargine  10 Units Subcutaneous QHS  . insulin glargine  20 Units Subcutaneous Daily  . living well with diabetes book   Does not apply Once  . losartan  25 mg Oral Daily  . mupirocin ointment   Nasal BID  . piperacillin-tazobactam (ZOSYN)  IV  3.375 g Intravenous Q8H  . simvastatin  10 mg Oral q1800  . sodium chloride  3 mL Intravenous Q12H   Continuous: . sodium chloride     [id number], acetaminophen, albuterol, ondansetron (ZOFRAN) IV, ondansetron  Assessment/Plan:  Principal Problem:   Osteomyelitis Active Problems:   Iron deficiency anemia, unspecified   CKD (chronic kidney disease)   DM type 1 causing renal disease    Cellulitis/Osteomyelitis of the left foot second toe MRI suggests cellulitis rather than osteomyelitis although it could not be ruled out definitively. Ortho input noted. No need for surgery currently. Continue iV antibiotics till Sunday and then discharge on Monday with Doxycycline. Blood cultures  negative so far. Start Doxycycline today.  Diabetes mellitus type 1  Not well controlled due to non compliance. Lantus plus SSI. Monitor closely. No further changes to dose as patient will not adhere to it. Patient admits to dietary indiscretion as well. A1C was 11.3 in January. Patient not receptive to diabetes education. She has her own preconceived notions and refused ordered dose of lantus last night stating that it would have caused her blood sugar to be very low this morning. I tried to educate her but she states "doctors mess up her insulin everytime". It was very difficult to have a conversation with her without her getting belligerent. Non compliance will be the main issue at this time.  Hypertension Continue home medications and closely follow metabolic panel. Patient is on ARB and history of chronic kidney disease.   Chronic kidney disease See above. Creatinine slightly higher today. She has been in negative balance. Will give IVF for 10 hours and recheck renal function in AM. Will also stop Vanc. Will need to see nephrology as OP.  Chronic anemia Stable.   Hyperlipidemia Continue statins.   Peripheral edema Probably from nephrotic syndrome. No DVT on dopplers. Patient states she has tried Lasix last month but was discontinued due to worsening creatinine. Needs OP follow up with Nephrology.  Code Status: Full Code  DVT Prophylaxis: SCD's    Family Communication: Discussed with patient  Disposition Plan: Discharge 3/16 if creatinine remains stable.    LOS: 3 days   [facility] Pager [phone] 06/14/2013, 10:47 AM  If 8PM-8AM, please contact night-coverage at [email or web address], password [facility]

## 2013-06-15 LAB — BASIC METABOLIC PANEL
BUN: 40 mg/dL — ABNORMAL HIGH (ref 6–23)
CO2: 18 meq/L — AB (ref 19–32)
Calcium: 8.6 mg/dL (ref 8.4–10.5)
Chloride: 106 mEq/L (ref 96–112)
Creatinine, Ser: 1.29 mg/dL — ABNORMAL HIGH (ref 0.50–1.10)
GFR calc Af Amer: 59 mL/min — ABNORMAL LOW (ref >90)
GFR calc non Af Amer: 51 mL/min — ABNORMAL LOW (ref >90)
GLUCOSE: 209 mg/dL — AB (ref 70–99)
POTASSIUM: 5 meq/L (ref 3.7–5.3)
SODIUM: 137 meq/L (ref 137–147)

## 2013-06-15 LAB — GLUCOSE, CAPILLARY
GLUCOSE-CAPILLARY: 159 mg/dL — AB (ref 70–99)
Glucose-Capillary: 220 mg/dL — ABNORMAL HIGH (ref 70–99)
Glucose-Capillary: 168 mg/dL — ABNORMAL HIGH (ref 70–99)

## 2013-06-15 MED ORDER — LOSARTAN POTASSIUM 25 MG PO TABS: 25.0000 mg | ORAL_TABLET | Freq: Every day | ORAL | Status: DC

## 2013-06-15 MED ORDER — DOXYCYCLINE HYCLATE 100 MG PO TABS: 100.0000 mg | ORAL_TABLET | Freq: Two times a day (BID) | ORAL | Status: DC

## 2013-06-15 MED ORDER — SODIUM BICARBONATE 650 MG PO TABS
650.0000 mg | ORAL_TABLET | Freq: Three times a day (TID) | ORAL | Status: DC
  Administered 2013-06-15: 650 mg via ORAL
  Filled 2013-06-15 (×3): qty 1

## 2013-06-15 MED ORDER — INSULIN GLARGINE 100 UNIT/ML ~~LOC~~ SOLN: 10.0000 [IU] | Freq: Two times a day (BID) | SUBCUTANEOUS | Status: DC

## 2013-06-15 MED ORDER — SODIUM BICARBONATE 650 MG PO TABS: 650.0000 mg | ORAL_TABLET | Freq: Three times a day (TID) | ORAL | Status: DC

## 2013-06-15 NOTE — Discharge Summary (Signed)
[facility]  Physician Discharge Summary   Patient ID: [name] MRN: [id number] DOB/AGE: 10/24/1970 43 y.o.  Admit date: 06/11/2013 Discharge date: 06/15/2013  PCP: Luetta Nutting, DO  DISCHARGE DIAGNOSES:  Principal Problem:   Osteomyelitis Active Problems:   Iron deficiency anemia, unspecified   CKD (chronic kidney disease)   DM type 1 causing renal disease   RECOMMENDATIONS FOR OUTPATIENT FOLLOW UP: 1. Needs referral to nephrology for CKD 2. Needs Bmet in at follow up.  3. Needs to follow up with Dr. Sharol Given regarding infected toes.  4. Compliance needs to be emphasized.  DISCHARGE CONDITION: fair  Diet recommendation: Mod Carb  Filed Weights   06/11/13 2235 06/12/13 0300 06/14/13 0532  Weight: 62.596 kg (138 lb) 62.097 kg (136 lb 14.4 oz) 61.009 kg (134 lb 8 oz)    INITIAL HISTORY: [name] is a 43 y.o. female with history of diabetes mellitus type 1, chronic kidney disease, hypertension, hyperlipidemia, chronic anemia started experiencing ulcers and her left toes over the last one month. She has been following up with wound clinic. There was increasing discharge from the wounds and x-rays done yesterday showed possible osteomyelitis of the second 2 toe and patient was started on antibiotics and admitted.  Consultations:  Dr. Sharol Given with ortho  Procedures:  None  HOSPITAL COURSE:   Cellulitis/Osteomyelitis of the left foot second toe  MRI suggests cellulitis rather than osteomyelitis although it could not be ruled out definitively. Patient was seen by Dr. Sharol Given with Orthopedics. No need for surgery currently. He recommended IV antibiotics till Sunday and then discharge on Monday with Doxycycline. He thinks she will require amputations of 2nd and 4th toes.Blood cultures negative so far. Patient was started on Doxycycline. She states she will get second opinion regarding amputations if recommended.   Diabetes mellitus type 1  Not well  controlled due to non compliance. Lantus plus SSI. Dose was adjusted but patient refuses to adhere. No further changes to dose as patient will not adhere to it. Patient admits to dietary indiscretion as well. A1C was 11.3 in January. Patient not receptive to diabetes education. She has her own preconceived notions and refused ordered dose of lantus. I tried to educate her but she states "doctors mess up her insulin everytime". It was very difficult to have a conversation with her without her getting belligerent. Non compliance will be the main issue at this time.   Hypertension  She was on Losartan in December but her pharmacy states she hasn't refilled it. She states BP medication raise her BP. She will need to follow up with her PCP.   Chronic kidney disease  She has CKD from DM and non compliance. Creatinine was high 3/15 but better today. Bicarb is slightly low. She will be discharged on Sod Bicarb. She needs referral to nephrology. She will need Bmet at followup.    Chronic anemia  Stable.   Hyperlipidemia  Continue statins.   Peripheral edema  Probably from nephrotic syndrome. No DVT on dopplers. Patient states she has tried Lasix last month but was discontinued due to worsening creatinine. Needs OP follow up with Nephrology.   Overall patient is stable. She is ok for discharge. Non compliance will be the main issue in her care.   PERTINENT LABS:  The results of significant diagnostics from this hospitalization (including imaging, microbiology, ancillary and laboratory) are listed below for reference.    Microbiology: Recent Results (from the past 240 hour(s))  CULTURE, BLOOD (ROUTINE X  2)     Status: None   Collection Time    06/12/13  2:20 AM      Result Value Ref Range Status   Specimen Description BLOOD LEFT HAND   Final   Special Requests BOTTLES DRAWN AEROBIC AND ANAEROBIC 5CC   Final   Culture  Setup Time     Final   Value: 06/12/2013 08:45     Performed at [facility]   Culture     Final   Value:        BLOOD CULTURE RECEIVED NO GROWTH TO DATE CULTURE WILL BE HELD FOR 5 DAYS BEFORE ISSUING A FINAL NEGATIVE REPORT     Performed at [facility]   Report Status PENDING   Incomplete  CULTURE, BLOOD (ROUTINE X 2)     Status: None   Collection Time    06/12/13  2:28 AM      Result Value Ref Range Status   Specimen Description BLOOD LEFT WRIST   Final   Special Requests BOTTLES DRAWN AEROBIC AND ANAEROBIC 5CC   Final   Culture  Setup Time     Final   Value: 06/12/2013 08:45     Performed at [facility]   Culture     Final   Value:        BLOOD CULTURE RECEIVED NO GROWTH TO DATE CULTURE WILL BE HELD FOR 5 DAYS BEFORE ISSUING A FINAL NEGATIVE REPORT     Performed at [facility]   Report Status PENDING   Incomplete  MRSA PCR SCREENING     Status: Abnormal   Collection Time    06/12/13  4:26 AM      Result Value Ref Range Status   MRSA by PCR POSITIVE (*) NEGATIVE Final   Comment:            The GeneXpert MRSA Assay (FDA     approved for NASAL specimens     only), is one component of a     comprehensive MRSA colonization     surveillance program. It is not     intended to diagnose MRSA     infection nor to guide or     monitor treatment for     MRSA infections.     RESULT CALLED TO, READ BACK BY AND VERIFIED WITH:     Chevis Pretty RN AT 0630 ON 03.13.15 BY SHUEA     Labs: Basic Metabolic Panel:  Recent Labs Lab 06/12/13 0035 06/12/13 0500 06/13/13 0450 06/14/13 0450 06/15/13 0443  NA 132* 135* 135* 136* 137  K 3.9 4.0 4.5 4.9 5.0  CL 97 104 105 105 106  CO2 21 17* 20 19 18*  GLUCOSE 267* 208* 295* 303* 209*  BUN 35* 32* 30* 36* 40*  CREATININE 1.39* 1.28* 1.32* 1.63* 1.29*  CALCIUM 8.6 8.0* 8.0* 8.4 8.6   CBC:  Recent Labs Lab 06/12/13 0035 06/12/13 0500 06/13/13 0450  WBC 7.1 6.7 6.2  NEUTROABS 4.4  --   --   HGB 9.2* 8.3* 8.1*  HCT 28.3* 26.0* 25.2*  MCV 72.8* 73.7* 75.0*  PLT 511* 395 376    CBG:  Recent Labs Lab 06/14/13 1058 06/14/13 1721 06/14/13 2024 06/15/13 0258 06/15/13 0746  GLUCAP 165* 302* 310* 220* 168*     IMAGING STUDIES  Mr Foot Left Wo Contrast  06/12/2013   CLINICAL DATA:  Soft tissue ulceration of the second toe of the left foot. Abnormal radiographs.  EXAM: MRI OF THE LEFT FOREFOOT WITHOUT CONTRAST  TECHNIQUE: Multiplanar, multisequence MR imaging was performed. No intravenous contrast was administered.  COMPARISON:  None.  FINDINGS: There is abnormal edema in the phalangeal bones of the second third and fourth toes without bone destruction. Edema is most intense around the PIP joint of the second toe. There is slight degenerative changes of the first metatarsal phalangeal joint.  There is also subcutaneous edema in the second, third and fourth toes but most severe in the second toe. No appreciable joint effusions. Air subcutaneous edema on the dorsum of the foot.  IMPRESSION: 1. Cellulitis of the second third and fourth toes of the left foot. 2. Edema in the bones of those toes without bone destruction or joint effusion. This edema could be secondary to hyperemia due to the cellulitis. The findings are not definitive for osteomyelitis.   Electronically Signed   By: Rozetta Nunnery M.D.   On: 06/12/2013 11:34   Dg Toe 2nd Left  06/11/2013   CLINICAL DATA:  Diabetic foot  EXAM: LEFT SECOND TOE  COMPARISON:  None.  FINDINGS: There is cortical erosion of the distal portion of the second proximal phalanx and throughout most of the second middle phalanx consistent with osteomyelitis. No fracture.  Mild degenerative change in the first MTP.  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   Electronically Signed   By: Franchot Gallo M.D.   On: 06/11/2013 10:03    DISCHARGE EXAMINATION: Filed Vitals:   06/13/13 2114 06/14/13 0532 06/14/13 2025 06/15/13 0558  BP: 156/67 165/80 186/87 163/79  Pulse: 84 81 84 82  Temp: 98.3 F (36.8 C) 98.3 F  (36.8 C) 98.2 F (36.8 C) 98.6 F (37 C)  TempSrc: Oral Oral Oral Oral  Resp: 20 18 18 18   Height:      Weight:  61.009 kg (134 lb 8 oz)    SpO2: 97% 97% 100% 98%   General appearance: alert, cooperative, appears stated age and no distress Resp: clear to auscultation bilaterally Cardio: regular rate and rhythm, S1, S2 normal, no murmur, click, rub or gallop GI: soft, non-tender; bowel sounds normal; no masses,  no organomegaly  DISPOSITION: Home  Discharge Orders   Future Appointments Provider Department Dept Phone   06/22/2013 8:00 AM Elayne Snare, MD [facility] [phone]   Future Orders Complete By Expires   Call MD for:  persistant nausea and vomiting  As directed    Call MD for:  redness, tenderness, or signs of infection (pain, swelling, redness, odor or green/yellow discharge around incision site)  As directed    Call MD for:  temperature >100.4  As directed    Diet Carb Modified  As directed    Discharge instructions  As directed    Comments:     Please take your medications as prescribed. Ask your doctor to refer you to a Nephrologist for your kidney problems. Follow up with an orthopedic surgeon for your toe infections. You need blood work at follow up.   Increase activity slowly  As directed       ALLERGIES:  Allergies  Allergen Reactions  . Ciprofloxacin Itching  . Cleocin [Clindamycin Hcl]     Diarrhea     Current Discharge Medication List    START taking these medications   Details  doxycycline (VIBRA-TABS) 100 MG tablet Take 1 tablet (100 mg total) by mouth every 12 (twelve) hours. For 2 weeks Qty: 28 tablet, Refills: 0  EPINEPHrine (EPIPEN) 0.3 mg/0.3 mL SOAJ injection Inject 0.3 mLs (0.3 mg total) into the muscle as needed. Qty: 2 Device, Refills: 1    sodium bicarbonate 650 MG tablet Take 1 tablet (650 mg total) by mouth 3 (three) times daily. Qty: 90 tablet, Refills: 0      CONTINUE these medications which have CHANGED     Details  insulin glargine (LANTUS) 100 UNIT/ML injection Inject 0.1-0.2 mLs (10-20 Units total) into the skin 2 (two) times daily. Take 20 units in am and 10 units at night Qty: 10 mL, Refills: 11    losartan (COZAAR) 25 MG tablet Take 1 tablet (25 mg total) by mouth daily. Qty: 30 tablet, Refills: 0      CONTINUE these medications which have NOT CHANGED   Details  albuterol (PROVENTIL HFA;VENTOLIN HFA) 108 (90 BASE) MCG/ACT inhaler Inhale 2 puffs into the lungs every 6 (six) hours as needed. S.O.B.    insulin lispro (HUMALOG) 100 UNIT/ML injection Inject 2-7 Units into the skin 3 (three) times daily before meals. Pt on sliding scale    pravastatin (PRAVACHOL) 20 MG tablet Take 20 mg by mouth daily.      STOP taking these medications     ciprofloxacin (CIPRO) 500 MG tablet      linezolid (ZYVOX) 600 MG tablet        Follow-up Information   Follow up with DUDA,MARCUS V, MD. Schedule an appointment as soon as possible for a visit in 1 week. (For wound re-check)    Specialty:  Orthopedic Surgery   Contact information:   [place] [id number] [phone]       Follow up with Johnny Bridge, MD. (only if you think you neeed a second opinion)    Specialty:  Orthopedic Surgery   Contact information:   [place] [address] [phone]       Follow up with Luetta Nutting, DO. Schedule an appointment as soon as possible for a visit in 3 days. (post hospitalization follow up for diabetes and chronic kidney disease management)    Specialty:  Family Medicine   Contact information:   [address] [place] [address] [phone]       TOTAL DISCHARGE TIME: 35 mins  [facility] Pager [phone]  06/15/2013, 8:57 AM

## 2013-06-15 NOTE — Discharge Instructions (Signed)
Wash left foot with dial soap and water daily with antibiotic ointment dry dressing change daily.

## 2013-06-15 NOTE — Progress Notes (Signed)
Patient verbalizes understanding of discharge instructions. Patient was given prescriptions and d/c forms. Patient is stable at discharge.

## 2013-06-18 LAB — CULTURE, BLOOD (ROUTINE X 2)
Culture: NO GROWTH
Culture: NO GROWTH

## 2013-06-22 ENCOUNTER — Ambulatory Visit: Payer: Medicaid Other | Admitting: Endocrinology

## 2013-06-25 ENCOUNTER — Ambulatory Visit (HOSPITAL_COMMUNITY)
Admission: RE | Admit: 2013-06-25 | Discharge: 2013-06-25 | Disposition: A | Discharge status: Home / Self Care | Payer: Medicaid Other | Source: Ambulatory Visit | Attending: Internal Medicine | Admitting: Internal Medicine

## 2013-06-25 ENCOUNTER — Other Ambulatory Visit (HOSPITAL_BASED_OUTPATIENT_CLINIC_OR_DEPARTMENT_OTHER): Payer: Self-pay | Admitting: Internal Medicine

## 2013-06-25 DIAGNOSIS — Z9289 Personal history of other medical treatment: Secondary | ICD-10-CM

## 2013-06-25 DIAGNOSIS — Z01818 Encounter for other preprocedural examination: Secondary | ICD-10-CM | POA: Insufficient documentation

## 2013-06-26 ENCOUNTER — Encounter: Payer: Self-pay | Admitting: Endocrinology

## 2013-06-26 ENCOUNTER — Ambulatory Visit (INDEPENDENT_AMBULATORY_CARE_PROVIDER_SITE_OTHER): Payer: Medicaid Other | Admitting: Endocrinology

## 2013-06-26 VITALS — BP 130/78 | HR 98 | Temp 97.8°F | Resp 14 | Ht 66.5 in | Wt 131.2 lb

## 2013-06-26 DIAGNOSIS — IMO0002 Reserved for concepts with insufficient information to code with codable children: Secondary | ICD-10-CM

## 2013-06-26 DIAGNOSIS — E875 Hyperkalemia: Secondary | ICD-10-CM

## 2013-06-26 DIAGNOSIS — N189 Chronic kidney disease, unspecified: Secondary | ICD-10-CM

## 2013-06-26 DIAGNOSIS — E1065 Type 1 diabetes mellitus with hyperglycemia: Secondary | ICD-10-CM

## 2013-06-26 LAB — COMPREHENSIVE METABOLIC PANEL
ALK PHOS: 107 U/L (ref 39–117)
ALT: 29 U/L (ref 0–35)
AST: 21 U/L (ref 0–37)
Albumin: 3.1 g/dL — ABNORMAL LOW (ref 3.5–5.2)
BILIRUBIN TOTAL: 0.1 mg/dL — AB (ref 0.3–1.2)
BUN: 45 mg/dL — ABNORMAL HIGH (ref 6–23)
CO2: 22 mEq/L (ref 19–32)
Calcium: 8.8 mg/dL (ref 8.4–10.5)
Chloride: 105 mEq/L (ref 96–112)
Creatinine, Ser: 1.8 mg/dL — ABNORMAL HIGH (ref 0.4–1.2)
GFR: 33.28 mL/min — ABNORMAL LOW (ref >60.00)
Glucose, Bld: 204 mg/dL — ABNORMAL HIGH (ref 70–99)
Potassium: 5.4 mEq/L — ABNORMAL HIGH (ref 3.5–5.1)
SODIUM: 135 meq/L (ref 135–145)
TOTAL PROTEIN: 7 g/dL (ref 6.0–8.3)

## 2013-06-26 NOTE — Patient Instructions (Signed)
Take Novolog at supper time daily

## 2013-06-26 NOTE — Progress Notes (Signed)
Patient ID: [name], female   DOB: 01/08/1971, 43 y.o.   MRN: [id number]   Reason for Appointment: Diabetes follow-up   History of Present Illness   Diagnosis: Type 1 DIABETES MELITUS  Lantus 12 hrs apart, 16 in am 0-9 hs  Previous history: She has had long-standing poorly controlled diabetes and typically has poor compliance with self care measures despite periodic diabetes education and periodic followup  Insulin regimen: Lantus 16 units a.m., 5-9 units p.m (10 pm - 2 am), Humalog 1:15g and 2:100 correction        Recent history:  She is again having overall significant hyperglycemia from not taking enough insulin overall However she is appearing to have better readings recently especially during the day Probably requiring less basal insulin since fasting readings are better and she is not taking evening Lantus consistently Fructosamine last month was somewhat better at 383  Problems identified: 3/15    She is irregular with the Lantus in the evening and will take it only at night when they sugar is significantly high and she is not taking correction Humalog doses for fear of hypoglycemia  She does not take her Humalog if eating at home in the evening since she is keeping her supplies upstairs in the bedroom and not in the kitchen when she is eating  She does not take her Humalog when she is eating during the day when she is working since she is eating mostly small snacks and does not get a time to take insulin or test her glucose  She is eating out frequently and mostly high fat meals and also frequently eating desserts like cheesecake  She will snack and have drinks with sugar without any Humalog coverage  She is trying to take her Humalog based on carbohydrate intake but but generally 4 units for her first meal,   Humalog: taking evening doses somewhat arbitrarily for high readings only  The level of control is significantly limited by her hypoglycemia unawareness  and tendency to severe hypoglycemia  Proper timing of medications in relation to meals:  in the 48-600+ mornings only       Monitors blood glucose:  2-4 times a day.    Glucometer:  Accu-Chek [employer]    Blood Glucose readings from meter download:   PREMEAL Breakfast Lunch Dinner Bedtime  overnight   Glucose range:  56-255   50-415   46-600+  168-600+   205-600+   Mean/median:  290   315    341   301    Hypoglycemia: Sporadically at 9 AM, 2 PM, 5 PM  Meals: 1 meal per day in the evening usually ,  eating at variable times and also having snacks throughout the day .          Physical activity: exercise: Unable to do much           Dietician visit: Most recent:?          Complications: are: Neuropathy, nephropathy, diabetic foot ulcer     Wt Readings from Last 3 Encounters:  06/26/13 131 lb 3.2 oz (59.512 kg)  06/14/13 134 lb 8 oz (61.009 kg)  06/01/13 134 lb (60.782 kg)   Lab Results  Component Value Date   HGBA1C 11.3* 04/08/2013   HGBA1C 13.5* 03/25/2013   HGBA1C 13.3* 12/24/2012   Lab Results  Component Value Date   MICROALBUR 108.7* 04/08/2013   LDLCALC 105* 11/16/2010   CREATININE 1.29* 06/15/2013  Medication List       This list is accurate as of: 06/26/13  2:24 PM.  Always use your most recent med list.               albuterol 108 (90 BASE) MCG/ACT inhaler  Commonly known as:  PROVENTIL HFA;VENTOLIN HFA  Inhale 2 puffs into the lungs every 6 (six) hours as needed. S.O.B.     doxycycline 100 MG tablet  Commonly known as:  VIBRA-TABS  Take 1 tablet (100 mg total) by mouth every 12 (twelve) hours. For 2 weeks     EPINEPHrine 0.3 mg/0.3 mL Soaj injection  Commonly known as:  EPIPEN  Inject 0.3 mLs (0.3 mg total) into the muscle as needed.     gabapentin 100 MG capsule  Commonly known as:  NEURONTIN  Take 100 mg by mouth.     insulin glargine 100 UNIT/ML injection  Commonly known as:  LANTUS  Inject 10-20 Units into the skin 2 (two) times daily. Take 16  units in am and 12 units at night     insulin lispro 100 UNIT/ML injection  Commonly known as:  HUMALOG  Inject 2-7 Units into the skin 3 (three) times daily before meals. Pt on sliding scale     losartan 25 MG tablet  Commonly known as:  COZAAR  Take 1 tablet (25 mg total) by mouth daily.     pravastatin 20 MG tablet  Commonly known as:  PRAVACHOL  Take 20 mg by mouth daily.     sodium bicarbonate 650 MG tablet  Take 1 tablet (650 mg total) by mouth 3 (three) times daily.     valACYclovir 500 MG tablet  Commonly known as:  VALTREX  Take 500 mg by mouth.        Allergies:  Allergies  Allergen Reactions  . Ciprofloxacin Itching  . Cleocin [Clindamycin Hcl]     Diarrhea     Past Medical History  Diagnosis Date  . Diabetes mellitus   . Neuropathy in diabetes   . Hypertension   . Hypercholesteremia   . H/O seasonal allergies   . Anemia   . Left foot infection     Past Surgical History  Procedure Laterality Date  . Cesarean section    . Eye surgery    . Hemrrhoidectomy    . Peripherally inserted central catheter insertion      No family history on file.  Social History:  reports that she quit smoking about 5 years ago. She has never used smokeless tobacco. She reports that she drinks about 0.5 ounces of alcohol per week. She reports that she does not use illicit drugs.  Review of Systems:  She is now having some swelling of her feet. She has not been going back to her PCP Her lisinopril was stopped because of hyperkalemia  Hypertension:  she previously has been on amlodipine. Blood pressure is higher than usual today She was on low-dose lisinopril, possibly had hyperkalemia with this and is not on any medications now  Lipids: Has not been taking her pravastatin recently despite reminders and LDL is still high      Examination:   BP 130/78  Pulse 98  Temp(Src) 97.8 F (36.6 C)  Resp 14  Ht 5' 6.5" (1.689 m)  Wt 131 lb 3.2 oz (59.512 kg)  BMI 20.86  kg/m2  SpO2 97%  LMP 06/25/2013  Body mass index is 20.86 kg/(m^2).   2+ pedal edema Left foot still has  ulcer on the toe  ASSESSMENT/ PLAN::   Diabetes type 1 with poor control and multiple complications    Blood glucose levels are still overall  significantly high with the average blood sugar 290 in the last 30 days Although she has a few good readings recently in the mornings blood sugars are still the highest late in the evening  Most of her poor control is related to noncompliance with insulin as directed  However the last few days she has been a little better compliant with her mealtime insulin in the mornings and adjusting her evening doses based on blood sugar since she was hospitalized See history of present illness for detailed analysis of her blood sugar patterns and day-to-day management Her fasting readings are mostly high and also readings at bedtime/overnight since she is not covering her evening meals as well as eating poorly in the evening with high carbohydrate or high fat meals, sweets and simple sugars She is a little more motivated and may do better with mealtime coverage with starting insulin pump next week  HYPERTENSION with diabetic nephropathy: Blood pressure is fairly good today  Edema: This is likely to be from nephrotic syndrome and is somewhat better. Nephrotic syndrome: Will need nephrology consultation, recent creatinine is somewhat better  Hyperlipidemia: She needs to be regular with pravastatin as LDL still high   Lab Results  Component Value Date   CHOL 214* 05/25/2013   HDL 31.00* 05/25/2013   LDLCALC 105* 11/16/2010   LDLDIRECT 112.2 05/25/2013   TRIG 337.0* 05/25/2013   CHOLHDL 7 05/25/2013    PLAN:   Recommended that she keep her Humalog pen in the kitchen to take at suppertime and at any snacks in the afternoons or evenings consistently for mealtime coverage based on carbohydrate intake  Also continue current regimen of 16 units Lantus in the  morning and take about 6 units in the evening unless blood sugar is low normal She will need to start with a relatively low basal rate on the pump to avoid hypoglycemia especially nocturnal which she is fearful of  Insulin pump settings to be started next week: Basal rate: Midnight = 0.3, 8 AM = 0.4. 4 PM = 0.5 Carbohydrate coverage 1:15, correction 1:70 with target 150  Counseling time over 50% of today's 25 minute visit . Destinae Neubecker 06/26/2013, 2:24 PM   Addendum: Creatinine and potassium higher, need to stop losartan and get nephrology consultation  Office Visit on 06/26/2013  Component Date Value Ref Range Status  . Sodium 06/26/2013 135  135 - 145 mEq/L Final  . Potassium 06/26/2013 5.4* 3.5 - 5.1 mEq/L Final  . Chloride 06/26/2013 105  96 - 112 mEq/L Final  . CO2 06/26/2013 22  19 - 32 mEq/L Final  . Glucose, Bld 06/26/2013 204* 70 - 99 mg/dL Final  . BUN 06/26/2013 45* 6 - 23 mg/dL Final  . Creatinine, Ser 06/26/2013 1.8* 0.4 - 1.2 mg/dL Final  . Total Bilirubin 06/26/2013 0.1* 0.3 - 1.2 mg/dL Final  . Alkaline Phosphatase 06/26/2013 107  39 - 117 U/L Final  . AST 06/26/2013 21  0 - 37 U/L Final  . ALT 06/26/2013 29  0 - 35 U/L Final  . Total Protein 06/26/2013 7.0  6.0 - 8.3 g/dL Final  . Albumin 06/26/2013 3.1* 3.5 - 5.2 g/dL Final  . Calcium 06/26/2013 8.8  8.4 - 10.5 mg/dL Final  . GFR 06/26/2013 33.28* >60.00 mL/min Final

## 2013-06-29 ENCOUNTER — Encounter: Payer: Medicaid Other | Attending: Endocrinology | Admitting: Nutrition

## 2013-06-29 DIAGNOSIS — E1029 Type 1 diabetes mellitus with other diabetic kidney complication: Secondary | ICD-10-CM

## 2013-06-29 IMAGING — CR DG FOOT COMPLETE 3+V*L*
3 series · 3 of 3 positions shown · non-contrast
Comparison: 12/11/2011

CLINICAL DATA: Left medial foot ulcer for 1 week.

LEFT FOOT - COMPLETE 3+ VIEW

[t foot ap left]
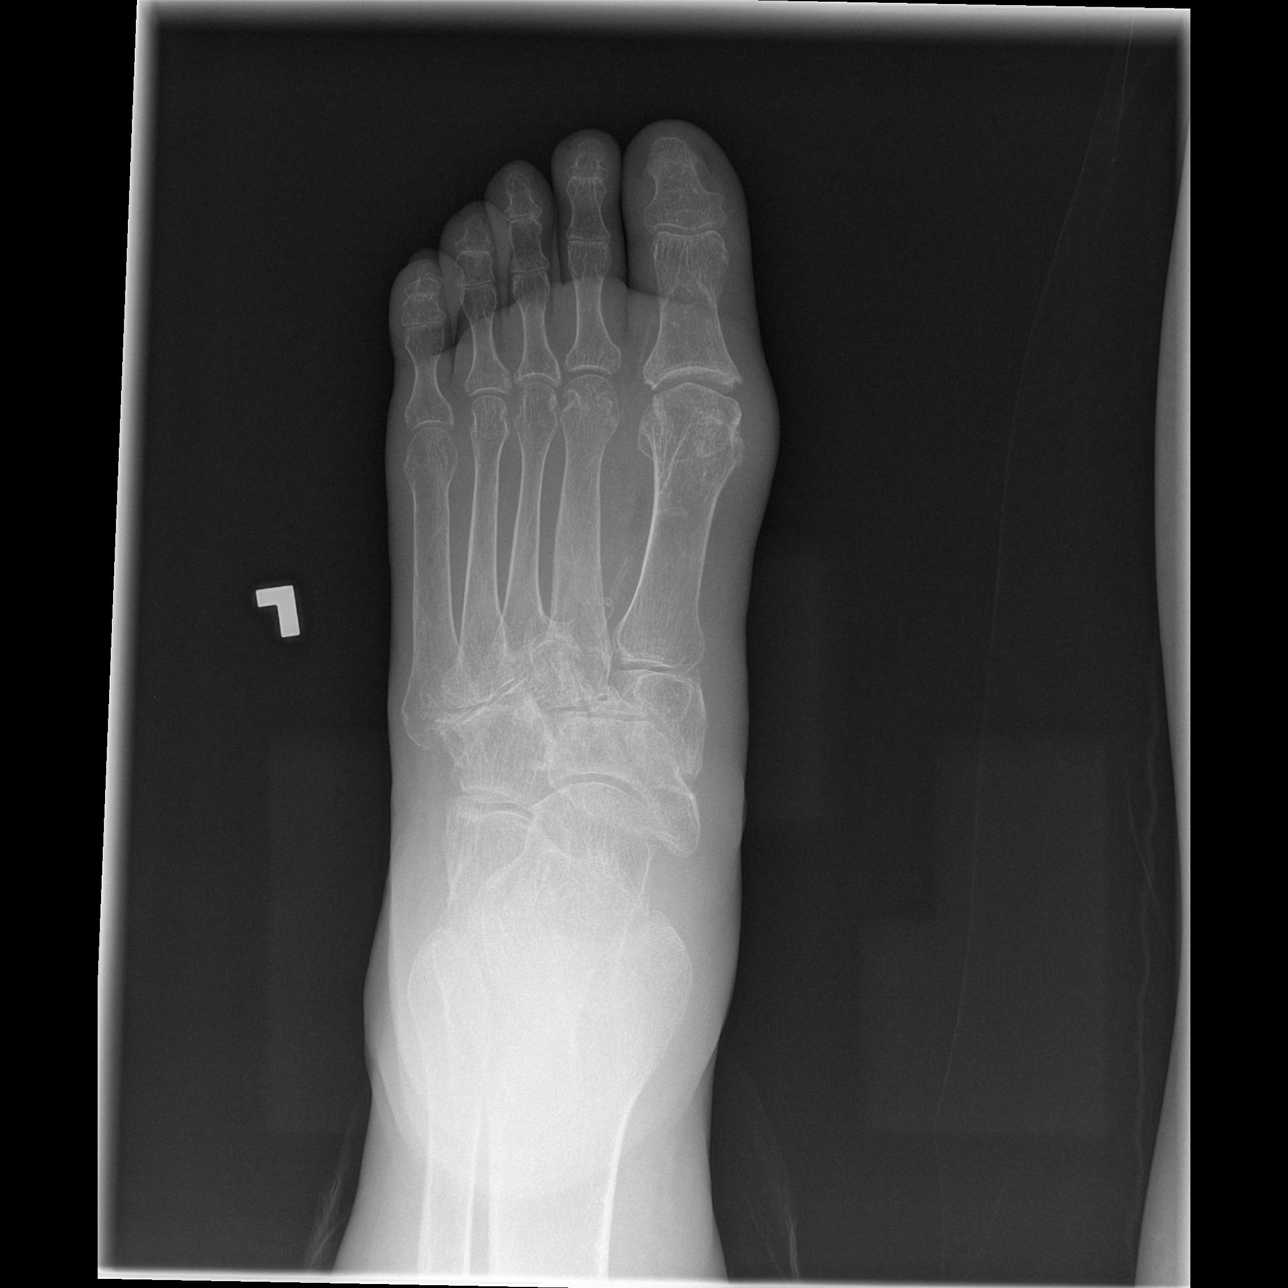

[t foot oblique left]
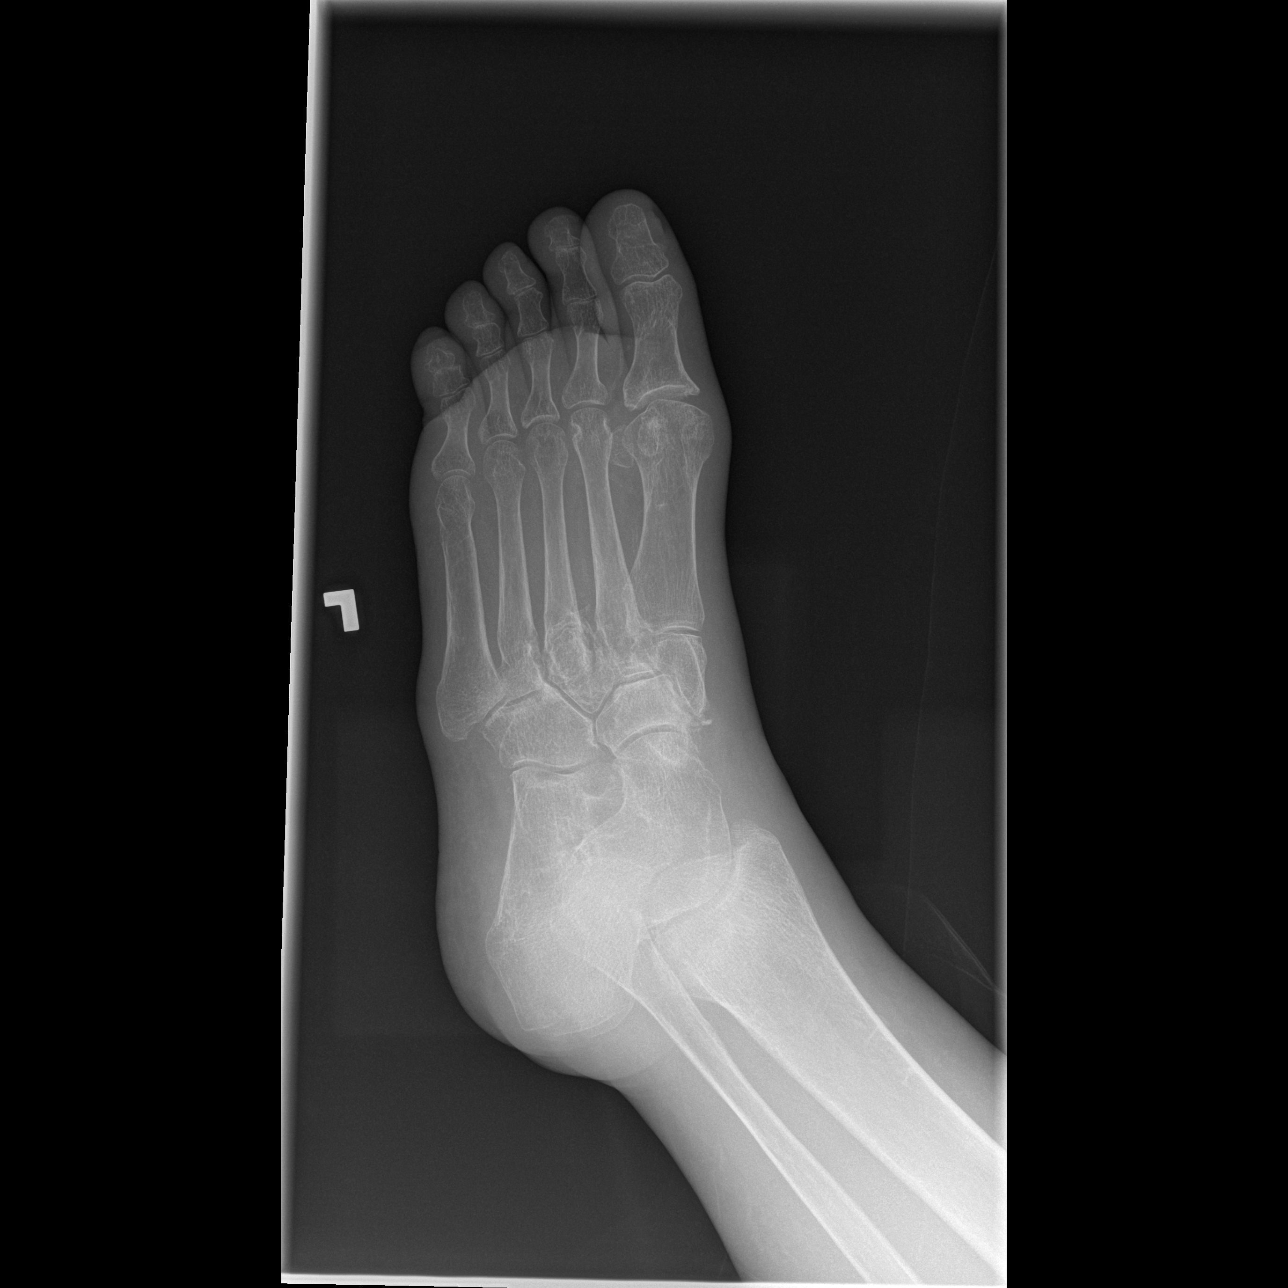

[t foot lat left]
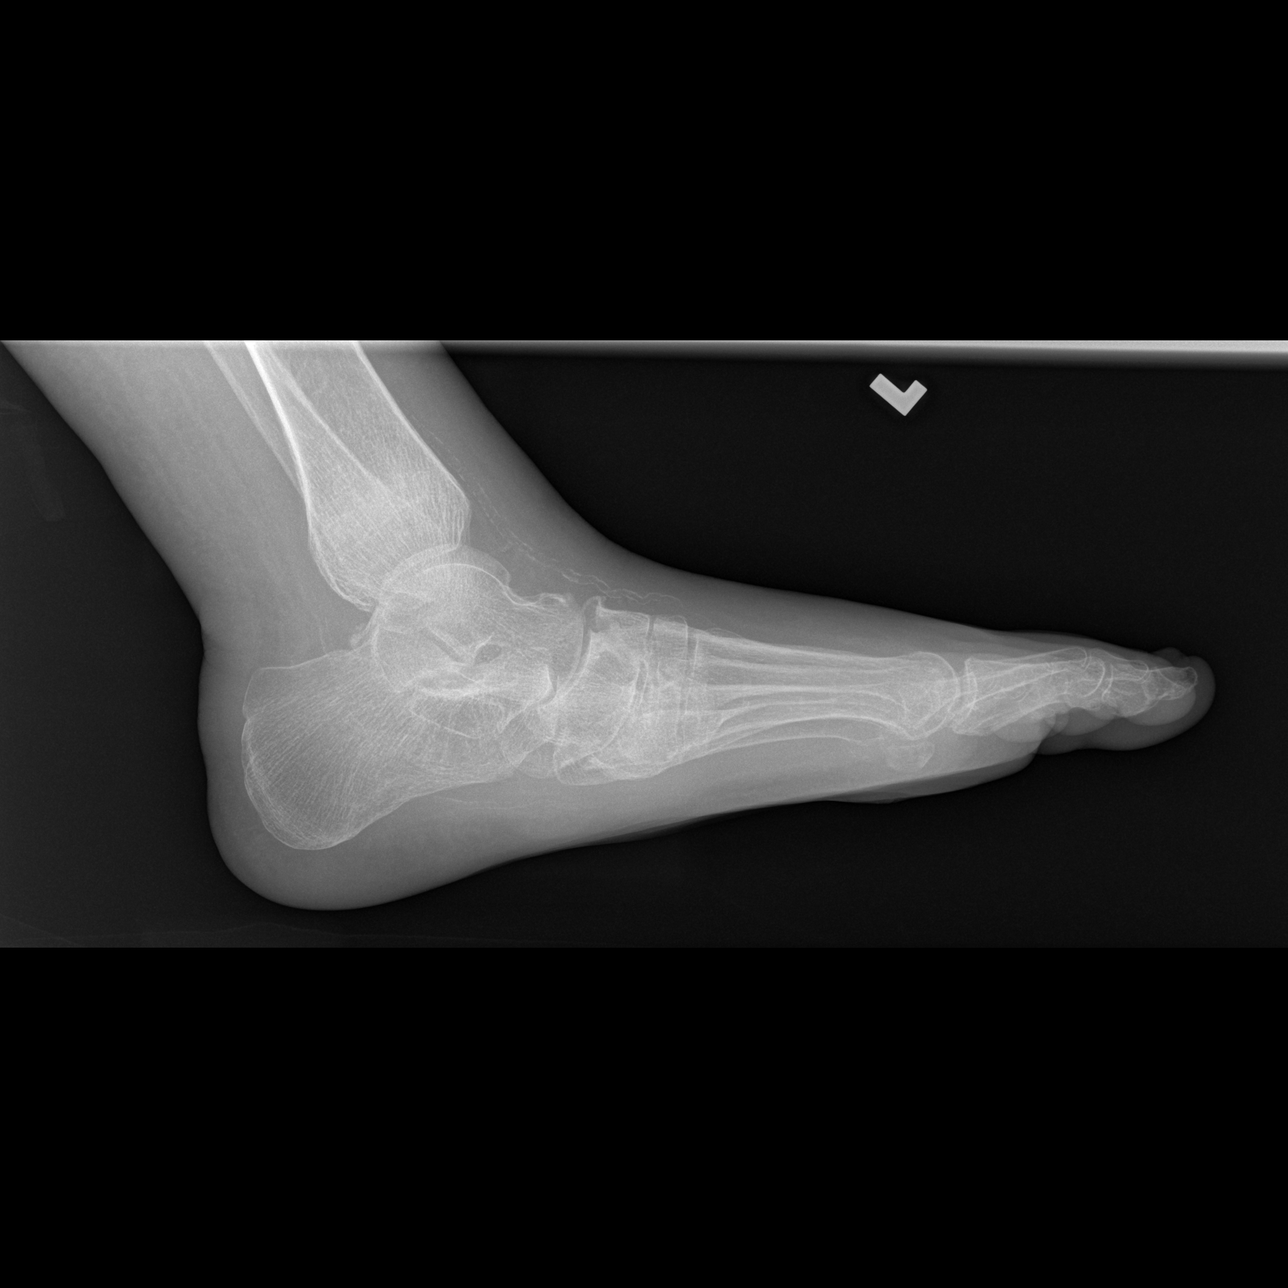

[3 of 3 positions shown; findings below may reference images not displayed]

FINDINGS: Diffuse bone demineralization.  Periarticular erosive
changes in the first and second metatarsal phalangeal joints.  Old
fracture deformities in the second and third proximal metatarsal
bones, navicular, and cuneiform bones.  Vascular calcifications.
The past planus.  No significant change since previous study.  No
evidence of any developing bone sclerosis, periosteal reaction, or
bone destruction to suggest evidence of osteomyelitis.
IMPRESSION: Diffuse bone demineralization with old fracture deformities and
erosive arthritic changes.  No significant change since previous
study.  No radiographic evidence of acute osteomyelitis.

## 2013-06-29 NOTE — Progress Notes (Signed)
[facility]  NAME:  [name], [name]                 ACCOUNT NO.:  MEDICAL RECORD NO.:  [id number]      DATE OF BIRTH:  12/31/1970  PHYSICIAN:  Judene Companion, M.D.           VISIT DATE:                                  OFFICE VISIT   This is a 43 year old Caucasian female, who is a diabetic and has been having great problems with her left foot and diabetic ulcers about 1 cm each on her second, third, fourth and fifth toes.  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  She is on doxycycline.  We have been treating her with varying methods of treatment from Santyl to alginate to collagen without a whole lot of success.  I would like to avoid amputation of her left second toe by continuing her on long-term antibiotics and dressing changes with collagen, and institute hyperbaric oxygen treatments to help save this toe which is involved with osteomyelitis.     Judene Companion, M.D.     PP/MEDQ  D:  06/26/2013  T:  06/27/2013  Job:  [id number]

## 2013-06-30 ENCOUNTER — Telehealth: Payer: Self-pay | Admitting: Endocrinology

## 2013-06-30 ENCOUNTER — Encounter: Payer: Medicaid Other | Admitting: Nutrition

## 2013-06-30 ENCOUNTER — Ambulatory Visit (INDEPENDENT_AMBULATORY_CARE_PROVIDER_SITE_OTHER): Payer: Medicaid Other | Admitting: Endocrinology

## 2013-06-30 DIAGNOSIS — E1029 Type 1 diabetes mellitus with other diabetic kidney complication: Secondary | ICD-10-CM

## 2013-06-30 DIAGNOSIS — E1065 Type 1 diabetes mellitus with hyperglycemia: Principal | ICD-10-CM

## 2013-06-30 DIAGNOSIS — E1129 Type 2 diabetes mellitus with other diabetic kidney complication: Secondary | ICD-10-CM

## 2013-06-30 DIAGNOSIS — E1121 Type 2 diabetes mellitus with diabetic nephropathy: Secondary | ICD-10-CM

## 2013-06-30 DIAGNOSIS — N049 Nephrotic syndrome with unspecified morphologic changes: Secondary | ICD-10-CM

## 2013-06-30 DIAGNOSIS — E1049 Type 1 diabetes mellitus with other diabetic neurological complication: Secondary | ICD-10-CM

## 2013-06-30 NOTE — Telephone Encounter (Signed)
I left 2 messaged on her phone last night at 10PM and again at 10:30PM to see how she was doing on her pump.  She did not answer.  Last message left was to call the 800 telephone number if questions, and will see her tomorrow at [place].

## 2013-06-30 NOTE — Progress Notes (Signed)
This patient was started on a [employer] insulin pump.  She had not done any of the prepump review.   She entered date/time/and calculations with little assistance from me.   Basal rates and carb calculations were entered per Dr. Ronnie Derby order:  Basal rate: MN: 0.3,  8AM: 0.4, 4PM: 0.5.  I/Cratio: 15,  ISF: 70,  Target: 140, and timing: 4 hours.   We reviewed how to give a bolus and carb counting.  She was given a Calorie Edison Pace book, when she said that she lost the one that was given to her by me, 4 weeks ago.  She redemonstrated how to give a bolus correctly X2.  Her blood sugar was 276, because she said that she did not given her Lantus insulin last night.  She reports that she does not give both the Lantus and Novolog doses in the evening, or she will drop low??? I explained that her basal rates are very low, but that I will call her tonight at 10 PM to see how she has done today.  She was encouraged to call the office if blood sugars stay high today, or drop low.  She agreed to do this.   She filled a cartridge and attached a Mio 5mm infusion set without any difficulty.  She was given written directions for filling and putting in the cartridge and infusion sets.   She was given a log sheet to record her blood sugars and told to test her blood sugars before, 2hr. pc and HS, and 4AM for the next 3 days. We set up her meter to send the readings to the pump.  She was shown how to charge the meter, and she reported good understanding of this.  She had no final questions. She was given a DVD on how to use the pump and told to watch it tonight, and we will review things tomorrow.  She agreed to do this.

## 2013-06-30 NOTE — Progress Notes (Signed)
Patient ID: [name], female   DOB: [birth year], 43 y.o.   MRN: [id number]   Reason for Appointment: Insulin pump follow-up   History of Present Illness   Diagnosis: Type 1 DIABETES MELITUS  Previous history: She has had long-standing poorly controlled diabetes and typically has poor compliance with self care measures despite periodic diabetes education and periodic followup  INSULIN PUMP regimen:  Basal rate: Midnight = 0.3, 8 AM = 0.4 and 4 PM = 0.5. Carb coverage 1:15g and correction 1:70 with target 150, active insulin 4 hours         Recent history:  She is doing fairly well with starting insulin pump yesterday. Prior to starting the pump she was taking between 16-25 units of Lantus a day , often not taking evening Lantus.  She did not take Lantus in the morning yesterday and blood sugars were over 250 when starting the pump in the morning She did experience hyperglycemia when she was fairly active at work and did not have any food at 7 PM. She overtreated this with 120 g of carbs and blood sugar was up to 276 at bedtime. With a correction bolus of 1.8 units she was 194 at 4 AM and this morning fasting glucose was 92 She has had no difficulty bolusing or wearing the pump overnight She had been given a Calorie Edison Pace book for counting carbohydrates but not clear if she is doing this accurately  Meals: 1 meal per day in the evening usually ,  eating at variable times and also having snacks throughout the day .          Physical activity: exercise: Unable to do much           Dietician visit: Most recent:?          Complications: are: Neuropathy, nephropathy, diabetic foot ulcer     Wt Readings from Last 3 Encounters:  06/26/13 131 lb 3.2 oz (59.512 kg)  06/14/13 134 lb 8 oz (61.009 kg)  06/01/13 134 lb (60.782 kg)   Lab Results  Component Value Date   HGBA1C 11.3* 04/08/2013   HGBA1C 13.5* 03/25/2013   HGBA1C 13.3* 12/24/2012   Lab Results  Component Value Date   MICROALBUR 108.7* 04/08/2013   LDLCALC 105* 11/16/2010   CREATININE 1.8* 06/26/2013        Medication List       This list is accurate as of: 06/30/13  3:42 PM.  Always use your most recent med list.               albuterol 108 (90 BASE) MCG/ACT inhaler  Commonly known as:  PROVENTIL HFA;VENTOLIN HFA  Inhale 2 puffs into the lungs every 6 (six) hours as needed. S.O.B.     doxycycline 100 MG tablet  Commonly known as:  VIBRA-TABS  Take 1 tablet (100 mg total) by mouth every 12 (twelve) hours. For 2 weeks     EPINEPHrine 0.3 mg/0.3 mL Soaj injection  Commonly known as:  EPIPEN  Inject 0.3 mLs (0.3 mg total) into the muscle as needed.     gabapentin 100 MG capsule  Commonly known as:  NEURONTIN  Take 100 mg by mouth.     insulin glargine 100 UNIT/ML injection  Commonly known as:  LANTUS  Inject 10-20 Units into the skin 2 (two) times daily. Take 16 units in am and 12 units at night     insulin lispro 100 UNIT/ML injection  Commonly known as:  HUMALOG  Inject 2-7 Units into the skin 3 (three) times daily before meals. Pt on sliding scale     losartan 25 MG tablet  Commonly known as:  COZAAR  Take 1 tablet (25 mg total) by mouth daily.     pravastatin 20 MG tablet  Commonly known as:  PRAVACHOL  Take 20 mg by mouth daily.     sodium bicarbonate 650 MG tablet  Take 1 tablet (650 mg total) by mouth 3 (three) times daily.     valACYclovir 500 MG tablet  Commonly known as:  VALTREX  Take 500 mg by mouth.        Allergies:  Allergies  Allergen Reactions  . Ciprofloxacin Itching  . Cleocin [Clindamycin Hcl]     Diarrhea     Past Medical History  Diagnosis Date  . Diabetes mellitus   . Neuropathy in diabetes   . Hypertension   . Hypercholesteremia   . H/O seasonal allergies   . Anemia   . Left foot infection     Past Surgical History  Procedure Laterality Date  . Cesarean section    . Eye surgery    . Hemrrhoidectomy    . Peripherally inserted central  catheter insertion      No family history on file.  Social History:  reports that she quit smoking about 5 years ago. She has never used smokeless tobacco. She reports that she drinks about 0.5 ounces of alcohol per week. She reports that she does not use illicit drugs.  Review of Systems:  Hypertension:  Her lisinopril was stopped because of hyperkalemia but she was discharged on 25 mg losartan. She is again having a higher creatinine and potassium level  Lipids: Has not been taking her pravastatin consistently despite reminders and LDL is still high  Lab Results  Component Value Date   CHOL 214* 05/25/2013   HDL 31.00* 05/25/2013   LDLCALC 105* 11/16/2010   LDLDIRECT 112.2 05/25/2013   TRIG 337.0* 05/25/2013   CHOLHDL 7 05/25/2013        Examination:   LMP 06/25/2013  There is no weight on file to calculate BMI.    ASSESSMENT/ PLAN::   Diabetes type 1 with poor control and multiple complications    Blood glucose levels overall high despite starting insulin pump yesterday but she had not taken Lantus She did get hypoglycemic when she was very active at work and did not either reduce her basal rate or have a carbohydrate snack Also appears to have overtreated her low sugar and blood sugar was high again at night. However her glucose was excellent at 92 this morning without nocturnal hypoglycemia  She does howeverwake up usually at 9 AM instead of 7:30 a.m. which she did today   HYPERTENSION with diabetic nephropathy:  She has been told to stop losartan and will get nephrology consultation   PLAN:  Will need to reduce her basal rate round-the-clock as a precaution Discussed using temporary basal rates were increased activity and proper treatment and prevention of hypoglycemia She will keep her blood sugar monitor at work and check more often  BASAL rates: Midnight = 0.2, 9 AM = 0.4, carbohydrate coverage 1:20 with target 150 and sensitivity 1:70 . Teegan Guinther 06/30/2013,  3:42 PM     Office Visit on 06/26/2013  Component Date Value Ref Range Status  . Sodium 06/26/2013 135  135 - 145 mEq/L Final  . Potassium 06/26/2013 5.4* 3.5 - 5.1 mEq/L Final  . Chloride 06/26/2013 105  96 - 112 mEq/L Final  . CO2 06/26/2013 22  19 - 32 mEq/L Final  . Glucose, Bld 06/26/2013 204* 70 - 99 mg/dL Final  . BUN 06/26/2013 45* 6 - 23 mg/dL Final  . Creatinine, Ser 06/26/2013 1.8* 0.4 - 1.2 mg/dL Final  . Total Bilirubin 06/26/2013 0.1* 0.3 - 1.2 mg/dL Final  . Alkaline Phosphatase 06/26/2013 107  39 - 117 U/L Final  . AST 06/26/2013 21  0 - 37 U/L Final  . ALT 06/26/2013 29  0 - 35 U/L Final  . Total Protein 06/26/2013 7.0  6.0 - 8.3 g/dL Final  . Albumin 06/26/2013 3.1* 3.5 - 5.2 g/dL Final  . Calcium 06/26/2013 8.8  8.4 - 10.5 mg/dL Final  . GFR 06/26/2013 33.28* >60.00 mL/min Final

## 2013-06-30 NOTE — Patient Instructions (Addendum)
Watch DVD on pump therapy. Test blood sugars before meals, 2hr. After meals, bedtime, and 3AM Call the office today if blood sugars drop, or stay above 200.

## 2013-06-30 NOTE — Progress Notes (Signed)
Pt. Reported no difficulty giving self boluses for meals.  She went to work yesterday at [employer], but did not bring her meter.  She reports dropping low at work around [employer].  She then ate 4 frozen fruit bars (120 grams of carbs).  Blood sugar at 9PM was 246.  She bolused for this and supper meal.  She was very active at work,  But did a correction at [employer].  She then panicked and ate 30 carbs with not bolus.  Her blood sugar at 4AM was 194, and FBS today was 92.  We reviewed how / when to use a temporary basal rate, and she reported good understanding of this.  She says that she does not eat high fat, and does not need to learn how to give a duel wave bolus.  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  Discussed appropriate amounts for low blood sugar--15 grams for a 60 point blood sugar rise.  She says that she needs more, and that she is very afraid of low blood sugars.  We discussed the fact that she is no longer on a long acting insulin, and that she does not need to over treat her lows.  We discussed IOB, and how to use this to determine how much glucose she needs to treat a low blood sugar.  She was not willing to learn this at this time.  Will try tomorrow.  She did not watch the video, and was encouraged to do this tonight.   She was told to bring a cartridge, infusion set and insulin tomorrow, and to review the papers given to her on how to do this.

## 2013-06-30 NOTE — Patient Instructions (Signed)
Change settings

## 2013-07-01 ENCOUNTER — Ambulatory Visit (INDEPENDENT_AMBULATORY_CARE_PROVIDER_SITE_OTHER): Payer: Medicaid Other | Admitting: Endocrinology

## 2013-07-01 ENCOUNTER — Encounter: Payer: Medicaid Other | Attending: Endocrinology | Admitting: Nutrition

## 2013-07-01 ENCOUNTER — Encounter: Payer: Self-pay | Admitting: Endocrinology

## 2013-07-01 VITALS — BP 118/78

## 2013-07-01 DIAGNOSIS — E1029 Type 1 diabetes mellitus with other diabetic kidney complication: Secondary | ICD-10-CM

## 2013-07-01 DIAGNOSIS — N058 Unspecified nephritic syndrome with other morphologic changes: Secondary | ICD-10-CM

## 2013-07-01 DIAGNOSIS — E1065 Type 1 diabetes mellitus with hyperglycemia: Principal | ICD-10-CM

## 2013-07-01 DIAGNOSIS — E1049 Type 1 diabetes mellitus with other diabetic neurological complication: Secondary | ICD-10-CM

## 2013-07-01 NOTE — Progress Notes (Signed)
Patient ID: [name], female   DOB: [birth year], 43 y.o.   MRN: [id number]   Reason for Appointment: Insulin pump follow-up   History of Present Illness   Diagnosis: Type 1 DIABETES MELITUS  Previous history: She has had long-standing poorly controlled diabetes and typically has poor compliance with self care measures despite periodic diabetes education and periodic followup  INSULIN PUMP regimen:  Basal rate: Midnight = 0.2, 9 AM = 0.4 Carb coverage 1: 20 g and correction 1:70 with target 150, active insulin 4 hours         Recent history:  She is doing fairly well with her new insulin pump therapy Prior to starting the pump she was taking between 16-25 units of Lantus a day , often not taking evening Lantus. Her blood sugar was fairly good yesterday but very high this morning and she was also thirsty Fasting glucose readings: 92, 473 Lunchtime 247, 6 PM = 123, 8 PM = 208 and bedtime 136; did not check overnight  Her boluses were 0.5-5 at 9 AM, 3.6 at 2 PM and 0.825 at 8 PM  Meals: 1 meal per day in the evening usually ,  eating at variable times and also having snacks throughout the day .          Physical activity: exercise: Unable to do much           Dietician visit: Most recent:?          Complications: are: Neuropathy, nephropathy, diabetic foot ulcer     Wt Readings from Last 3 Encounters:  06/26/13 131 lb 3.2 oz (59.512 kg)  06/14/13 134 lb 8 oz (61.009 kg)  06/01/13 134 lb (60.782 kg)   Lab Results  Component Value Date   HGBA1C 11.3* 04/08/2013   HGBA1C 13.5* 03/25/2013   HGBA1C 13.3* 12/24/2012   Lab Results  Component Value Date   MICROALBUR 108.7* 04/08/2013   LDLCALC 105* 11/16/2010   CREATININE 1.8* 06/26/2013        Medication List       This list is accurate as of: 07/01/13  8:39 AM.  Always use your most recent med list.               albuterol 108 (90 BASE) MCG/ACT inhaler  Commonly known as:  PROVENTIL HFA;VENTOLIN HFA  Inhale 2 puffs  into the lungs every 6 (six) hours as needed. S.O.B.     doxycycline 100 MG tablet  Commonly known as:  VIBRA-TABS  Take 1 tablet (100 mg total) by mouth every 12 (twelve) hours. For 2 weeks     EPINEPHrine 0.3 mg/0.3 mL Soaj injection  Commonly known as:  EPIPEN  Inject 0.3 mLs (0.3 mg total) into the muscle as needed.     gabapentin 100 MG capsule  Commonly known as:  NEURONTIN  Take 100 mg by mouth.     insulin glargine 100 UNIT/ML injection  Commonly known as:  LANTUS  Inject 10-20 Units into the skin 2 (two) times daily. Take 16 units in am and 12 units at night     insulin lispro 100 UNIT/ML injection  Commonly known as:  HUMALOG  Inject 2-7 Units into the skin 3 (three) times daily before meals. Pt on sliding scale     losartan 25 MG tablet  Commonly known as:  COZAAR  Take 1 tablet (25 mg total) by mouth daily.     pravastatin 20 MG tablet  Commonly known as:  PRAVACHOL  Take 20 mg by mouth daily.     sodium bicarbonate 650 MG tablet  Take 1 tablet (650 mg total) by mouth 3 (three) times daily.     valACYclovir 500 MG tablet  Commonly known as:  VALTREX  Take 500 mg by mouth.        Allergies:  Allergies  Allergen Reactions  . Ciprofloxacin Itching  . Cleocin [Clindamycin Hcl]     Diarrhea     Past Medical History  Diagnosis Date  . Diabetes mellitus   . Neuropathy in diabetes   . Hypertension   . Hypercholesteremia   . H/O seasonal allergies   . Anemia   . Left foot infection     Past Surgical History  Procedure Laterality Date  . Cesarean section    . Eye surgery    . Hemrrhoidectomy    . Peripherally inserted central catheter insertion      No family history on file.  Social History:  reports that she quit smoking about 5 years ago. She has never used smokeless tobacco. She reports that she drinks about 0.5 ounces of alcohol per week. She reports that she does not use illicit drugs.  Review of Systems:  Hypertension:  Her lisinopril  was stopped because of hyperkalemia but she was discharged on 25 mg losartan. She is again having a higher creatinine and potassium level  Lipids: Has not been taking her pravastatin consistently despite reminders and LDL is still high  Lab Results  Component Value Date   CHOL 214* 05/25/2013   HDL 31.00* 05/25/2013   LDLCALC 105* 11/16/2010   LDLDIRECT 112.2 05/25/2013   TRIG 337.0* 05/25/2013   CHOLHDL 7 05/25/2013        Examination:   BP 118/78  LMP 06/25/2013  There is no weight on file to calculate BMI.    ASSESSMENT/ PLAN::   Diabetes type 1 with poor control and multiple complications    Blood glucose levels  were fairly good yesterday but markedly increased this morning Most likely she is now not having any residual Lantus insulin Explained to the patient that she is getting only a total of about 8 units basal a day compared to at least 16 with her Lantus This is causing her high reading this morning Otherwise her blood sugars have been fairly good although her postprandial reading yesterday was relatively low at 123 which is below her target 150 No hypoglycemia yesterday  Diabetic nephropathy: Blood pressure is quite good today without any losartan  PLAN:  Will need to reduce her basal rate at 4 PM and increase it overnight, will reduce her carbohydrate coverage and sensitivity She will need to check some readings overnight also and do a correction bolus if needed Diabetes education was continued today for day-to-day management  BASAL rates: Midnight = 0.275, 9 AM = 0.4, 4 pm . 0.35 carbohydrate coverage 1:22 with target 150 and sensitivity 1:75  Nephrology consultation sent for her renal dysfunction and tendency to hyperkalemia . Kodi Steil 07/01/2013, 8:39 AM

## 2013-07-01 NOTE — Patient Instructions (Signed)
BASAL rates: Midnight = 0.275, 9 AM = 0.4, 4 pm = 0.35  carbohydrate coverage 1:22 with target 150 and sensitivity 1:75

## 2013-07-01 NOTE — Patient Instructions (Signed)
Bring cartridge, infusion set and insulin tomorrow Read over literature given on how to change the cartridge and infusion sets.

## 2013-07-01 NOTE — Patient Instructions (Signed)
Watch video and read over handouts given on changing the cartridge/infusion set, sick day guidelines, high blood sugar protocols. Test blood sugars before meals and at [facility]

## 2013-07-01 NOTE — Progress Notes (Signed)
She did not bring her cartridge and infusion set.  "she forgot".  Blood sugars were pretty good yesterday.  She reports have drank a "Slirpy" drink over 5 hours last night for her supper meal.  She took no insulin for this, because the blood sugar was low (70s) and she was not drinking it all at once.   She did not review anything last night, and so, did not know how to change the cartridge and the steps needed for this.  We reviewed this again, and she filled a cartridge and infusion set and inserted the infusion set with some assistance from me.   She was shown how to give a extended bolus for times when she will be eating/drinking something over an extended period of time.  She reported good understanding of this.   She made changes in her basal rates and bolus calculations per Dr. Ronnie Derby order with some assistance still from me.  She was encouraged again to review how to do this, by watching the video. We reviewed all topics including sick days and high blood sugar protocols, and she was given handouts on sick day guidelines, and high blood sugar protocols to follow.  She signed off on understanding all topics discussed  She was not able to make changes to the basal rate/and carb calculations, without some assistance from me.  She was told to review this on the video and/or in the manual that was given to her.  Basal rates and carb ratios were changed per Dr. Ronnie Derby orders.  She had no final questions.

## 2013-07-02 ENCOUNTER — Encounter (HOSPITAL_BASED_OUTPATIENT_CLINIC_OR_DEPARTMENT_OTHER): Payer: Medicaid Other | Attending: General Surgery

## 2013-07-02 ENCOUNTER — Ambulatory Visit (INDEPENDENT_AMBULATORY_CARE_PROVIDER_SITE_OTHER): Payer: Medicaid Other | Admitting: Endocrinology

## 2013-07-02 DIAGNOSIS — N189 Chronic kidney disease, unspecified: Secondary | ICD-10-CM

## 2013-07-02 DIAGNOSIS — E1065 Type 1 diabetes mellitus with hyperglycemia: Principal | ICD-10-CM

## 2013-07-02 DIAGNOSIS — E1169 Type 2 diabetes mellitus with other specified complication: Secondary | ICD-10-CM | POA: Insufficient documentation

## 2013-07-02 DIAGNOSIS — L97509 Non-pressure chronic ulcer of other part of unspecified foot with unspecified severity: Secondary | ICD-10-CM | POA: Diagnosis not present

## 2013-07-02 DIAGNOSIS — E1049 Type 1 diabetes mellitus with other diabetic neurological complication: Secondary | ICD-10-CM

## 2013-07-02 NOTE — Patient Instructions (Signed)
BASAL AT mn = 0.325  IF SUGAR WAKING UP or during the night is >300 then go up to 0.375 on MN basal  Sensitivity 1:90

## 2013-07-03 ENCOUNTER — Encounter (HOSPITAL_BASED_OUTPATIENT_CLINIC_OR_DEPARTMENT_OTHER): Payer: Medicaid Other | Attending: General Surgery

## 2013-07-03 DIAGNOSIS — L97509 Non-pressure chronic ulcer of other part of unspecified foot with unspecified severity: Secondary | ICD-10-CM | POA: Insufficient documentation

## 2013-07-03 DIAGNOSIS — E1169 Type 2 diabetes mellitus with other specified complication: Secondary | ICD-10-CM | POA: Insufficient documentation

## 2013-07-04 NOTE — Progress Notes (Signed)
Patient ID: [name], female   DOB: 1970/07/08, 43 y.o.   MRN: [id number]   Reason for Appointment: Insulin pump follow-up   History of Present Illness   Diagnosis: Type 1 DIABETES MELITUS  Previous history: She has had long-standing poorly controlled diabetes and typically has poor compliance with self care measures despite periodic diabetes education and periodic followup  INSULIN PUMP regimen:  Basal rate: Midnight = 0.275, 9 AM = 0.4 Carb coverage 1: 22 g and correction 1: 75 with target 150, active insulin 4 hours         Recent history:  Her blood sugars been inconsistent with starting insulin pump this week So far has been checking her blood sugars several times a day although inconsistently overnight She has been reluctant to allow increase his in her basal rate overnight and it was increased only slightly yesterday. Her glucose went out of control last night because she had a large amount of sugar in a sweet drink at about 9 PM without any insulin coverage Even with the bolus of 4.5 units at 11 PM her glucose was still 545 early this morning; at 9 AM it had come down to 103 Also yesterday after doing a correction bolus at 10 AM she went down to 46; however had already taken a 4.6 unit bolus 3 hours before that. She did not eat dinner and glucose had come up to 239 at 6 PM Prior to starting the pump she was taking between 16-25 units of Lantus a day , often not taking evening Lantus. Her total basal rate now is 8.1 units per day She thinks she is fairly comfortable with managing her pump and changing her infusion sets now although has not received this applies for the suggested Mio infusion set  Meals: 1 meal per day in the evening usually ,  eating at variable times and also having snacks throughout the day .          Physical activity: exercise: Unable to do much           Dietician visit: Most recent:?          Complications: are: Neuropathy, nephropathy, diabetic  foot ulcer     Wt Readings from Last 3 Encounters:  06/26/13 131 lb 3.2 oz (59.512 kg)  06/14/13 134 lb 8 oz (61.009 kg)  06/01/13 134 lb (60.782 kg)   Lab Results  Component Value Date   HGBA1C 11.3* 04/08/2013   HGBA1C 13.5* 03/25/2013   HGBA1C 13.3* 12/24/2012   Lab Results  Component Value Date   MICROALBUR 108.7* 04/08/2013   LDLCALC 105* 11/16/2010   CREATININE 1.8* 06/26/2013        Medication List       This list is accurate as of: 07/02/13 11:59 PM.  Always use your most recent med list.               albuterol 108 (90 BASE) MCG/ACT inhaler  Commonly known as:  PROVENTIL HFA;VENTOLIN HFA  Inhale 2 puffs into the lungs every 6 (six) hours as needed. S.O.B.     doxycycline 100 MG tablet  Commonly known as:  VIBRA-TABS  Take 1 tablet (100 mg total) by mouth every 12 (twelve) hours. For 2 weeks     EPINEPHrine 0.3 mg/0.3 mL Soaj injection  Commonly known as:  EPIPEN  Inject 0.3 mLs (0.3 mg total) into the muscle as needed.     gabapentin 100 MG capsule  Commonly known as:  NEURONTIN  Take 100 mg by mouth.     insulin glargine 100 UNIT/ML injection  Commonly known as:  LANTUS  Inject 10-20 Units into the skin 2 (two) times daily. Take 16 units in am and 12 units at night     insulin lispro 100 UNIT/ML injection  Commonly known as:  HUMALOG  Inject 2-7 Units into the skin 3 (three) times daily before meals. Pt on sliding scale     losartan 25 MG tablet  Commonly known as:  COZAAR  Take 1 tablet (25 mg total) by mouth daily.     pravastatin 20 MG tablet  Commonly known as:  PRAVACHOL  Take 20 mg by mouth daily.     sodium bicarbonate 650 MG tablet  Take 1 tablet (650 mg total) by mouth 3 (three) times daily.     valACYclovir 500 MG tablet  Commonly known as:  VALTREX  Take 500 mg by mouth.        Allergies:  Allergies  Allergen Reactions  . Ciprofloxacin Itching  . Cleocin [Clindamycin Hcl]     Diarrhea     Past Medical History  Diagnosis  Date  . Diabetes mellitus   . Neuropathy in diabetes   . Hypertension   . Hypercholesteremia   . H/O seasonal allergies   . Anemia   . Left foot infection     Past Surgical History  Procedure Laterality Date  . Cesarean section    . Eye surgery    . Hemrrhoidectomy    . Peripherally inserted central catheter insertion      No family history on file.  Social History:  reports that she quit smoking about 5 years ago. She has never used smokeless tobacco. She reports that she drinks about 0.5 ounces of alcohol per week. She reports that she does not use illicit drugs.  Review of Systems:  Hypertension:  Her lisinopril was stopped because of hyperkalemia but she was discharged on 25 mg losartan. She is again having a higher creatinine and potassium level  Lipids: Has not been taking her pravastatin consistently despite reminders and LDL is still high  Lab Results  Component Value Date   CHOL 214* 05/25/2013   HDL 31.00* 05/25/2013   LDLCALC 105* 11/16/2010   LDLDIRECT 112.2 05/25/2013   TRIG 337.0* 05/25/2013   CHOLHDL 7 05/25/2013        Examination:   LMP 06/25/2013  There is no weight on file to calculate BMI.    ASSESSMENT/ PLAN::   Diabetes type 1 with poor control and multiple complications    Blood glucose levels  were fairly labile yesterday showing any tendency to low blood sugars after doing correction's especially if she is stacking insulin. Also blood sugar appears to be going down below her target of 150 with correction boluses She is still not consistent with her diet and causes very high readings with drinking large amounts of sweet drinks and not bolusing appropriately Now appears to be needing an increase in her overnight basal rate and may well require nearly the same basal rate  over the 24 hours Also needs reduction and correction bolus Discussed with patient adjusting basal rate based on blood sugar patterns and avoiding hyperglycemia She may continue  to need further adjustments She will try to be better compliant with diet also  PLAN:  Basal rate 0.325 between midnight-9 AM Correction bolus 1: 90 with target again 150 She will call blood sugar readings on Monday for further  adjustments . Della Scrivener 07/04/2013, 12:31 PM

## 2013-07-06 LAB — GLUCOSE, CAPILLARY
GLUCOSE-CAPILLARY: 370 mg/dL — AB (ref 70–99)
Glucose-Capillary: 203 mg/dL — ABNORMAL HIGH (ref 70–99)
Glucose-Capillary: 143 mg/dL — ABNORMAL HIGH (ref 70–99)
Glucose-Capillary: 235 mg/dL — ABNORMAL HIGH (ref 70–99)

## 2013-07-07 LAB — GLUCOSE, CAPILLARY
GLUCOSE-CAPILLARY: 110 mg/dL — AB (ref 70–99)
GLUCOSE-CAPILLARY: 121 mg/dL — AB (ref 70–99)
GLUCOSE-CAPILLARY: 56 mg/dL — AB (ref 70–99)
Glucose-Capillary: 77 mg/dL (ref 70–99)
Glucose-Capillary: 296 mg/dL — ABNORMAL HIGH (ref 70–99)

## 2013-07-08 ENCOUNTER — Ambulatory Visit: Payer: Medicaid Other | Admitting: Endocrinology

## 2013-07-08 ENCOUNTER — Encounter: Payer: Self-pay | Admitting: Endocrinology

## 2013-07-08 ENCOUNTER — Ambulatory Visit (INDEPENDENT_AMBULATORY_CARE_PROVIDER_SITE_OTHER): Payer: Medicaid Other | Admitting: Endocrinology

## 2013-07-08 VITALS — BP 118/68 | HR 81 | Temp 97.7°F | Resp 14 | Ht 66.5 in | Wt 132.0 lb

## 2013-07-08 DIAGNOSIS — E7800 Pure hypercholesterolemia, unspecified: Secondary | ICD-10-CM

## 2013-07-08 DIAGNOSIS — N189 Chronic kidney disease, unspecified: Secondary | ICD-10-CM

## 2013-07-08 DIAGNOSIS — E1049 Type 1 diabetes mellitus with other diabetic neurological complication: Secondary | ICD-10-CM

## 2013-07-08 DIAGNOSIS — E1065 Type 1 diabetes mellitus with hyperglycemia: Principal | ICD-10-CM

## 2013-07-08 LAB — GLUCOSE, CAPILLARY
Glucose-Capillary: 129 mg/dL — ABNORMAL HIGH (ref 70–99)
Glucose-Capillary: 284 mg/dL — ABNORMAL HIGH (ref 70–99)

## 2013-07-08 NOTE — Progress Notes (Signed)
Patient ID: [name], female   DOB: Sep 16, 1970, 43 y.o.   MRN: [id number]   Reason for Appointment: Insulin pump follow-up   History of Present Illness   Diagnosis: Type 1 DIABETES MELITUS  Previous history: She has had long-standing poorly controlled diabetes and typically has poor compliance with self care measures despite periodic diabetes education and periodic followup  INSULIN PUMP regimen:  Basal rate: Midnight = 0.3-5, 9 AM = 0.4, 4 PM = 0.35. Total basal insulin 8.525 units daily  Carb coverage 1: 22 g and correction 1: 90 with target 150, active insulin 4 hours         Recent history:  Her blood sugars been inconsistent with her insulin pump but still better controlled overall than with subcutaneous injections previously Has had some adjustments made to her basal rate including increased overnight basal rate on her last visit; also her correction ratio was changed to 1:90 because of tendency to hypoglycemia after boluses Current blood sugar patterns:  Fasting glucose has been variable with readings between 56-254 and only one low reading, was normal this morning. She is also getting up earlier in the mornings than usual since she has to go to the foot clinic  She tends to get significant rebound after her low blood sugars because of getting as much as 120 g of carbohydrate when the blood sugar is low  Unable to see what her postprandial readings are with current carbohydrate coverage since she has not done any readings after meals  Blood sugars not checked much around 6 PM but had an episode of low blood sugar without any increased activity  She apparently did a lot of exercise last evening and did not reconnect her pump which she had taken off but blood sugar was still normal this morning  Overall glucose average since starting the pump is 223, +/-128 with 5.5 readings per day on average  She is fairly comfortable with managing her pump and changing her settings as  well as infusion sets  Meals: 1 meal per day in the evening usually,  eating at variable times and also having snacks periodically     Physical activity: exercise: Only sporadically           Dietician visit: Most recent:?          Complications: are: Neuropathy, nephropathy, diabetic foot ulcer      Wt Readings from Last 3 Encounters:  07/08/13 132 lb (59.875 kg)  06/26/13 131 lb 3.2 oz (59.512 kg)  06/14/13 134 lb 8 oz (61.009 kg)   Lab Results  Component Value Date   HGBA1C 11.3* 04/08/2013   HGBA1C 13.5* 03/25/2013   HGBA1C 13.3* 12/24/2012   Lab Results  Component Value Date   MICROALBUR 108.7* 04/08/2013   LDLCALC 105* 11/16/2010   CREATININE 1.8* 06/26/2013        Medication List       This list is accurate as of: 07/08/13  1:32 PM.  Always use your most recent med list.               albuterol 108 (90 BASE) MCG/ACT inhaler  Commonly known as:  PROVENTIL HFA;VENTOLIN HFA  Inhale 2 puffs into the lungs every 6 (six) hours as needed. S.O.B.     doxycycline 100 MG tablet  Commonly known as:  VIBRA-TABS  Take 1 tablet (100 mg total) by mouth every 12 (twelve) hours. For 2 weeks     EPINEPHrine 0.3 mg/0.3 mL Soaj injection  Commonly known as:  EPIPEN  Inject 0.3 mLs (0.3 mg total) into the muscle as needed.     gabapentin 100 MG capsule  Commonly known as:  NEURONTIN  Take 100 mg by mouth.     insulin glargine 100 UNIT/ML injection  Commonly known as:  LANTUS  Inject 10-20 Units into the skin 2 (two) times daily. Take 16 units in am and 12 units at night     insulin lispro 100 UNIT/ML injection  Commonly known as:  HUMALOG  Inject 2-7 Units into the skin 3 (three) times daily before meals. Pt on sliding scale     losartan 25 MG tablet  Commonly known as:  COZAAR  Take 1 tablet (25 mg total) by mouth daily.     pravastatin 20 MG tablet  Commonly known as:  PRAVACHOL  Take 20 mg by mouth daily.     sodium bicarbonate 650 MG tablet  Take 1 tablet (650 mg  total) by mouth 3 (three) times daily.     valACYclovir 500 MG tablet  Commonly known as:  VALTREX  Take 500 mg by mouth.        Allergies:  Allergies  Allergen Reactions  . Ciprofloxacin Itching  . Cleocin [Clindamycin Hcl]     Diarrhea     Past Medical History  Diagnosis Date  . Diabetes mellitus   . Neuropathy in diabetes   . Hypertension   . Hypercholesteremia   . H/O seasonal allergies   . Anemia   . Left foot infection     Past Surgical History  Procedure Laterality Date  . Cesarean section    . Eye surgery    . Hemrrhoidectomy    . Peripherally inserted central catheter insertion      No family history on file.  Social History:  reports that she quit smoking about 5 years ago. She has never used smokeless tobacco. She reports that she drinks about .5 ounces of alcohol per week. She reports that she does not use illicit drugs.  Review of Systems:  Hypertension:  Her lisinopril and losartan have been stopped because of hyperkalemia and higher creatinine levels. She is waiting for nephrology consultation  Lipids: Has been irregular with pravastatin despite reminders and LDL is still high  Lab Results  Component Value Date   CHOL 214* 05/25/2013   HDL 31.00* 05/25/2013   LDLCALC 105* 11/16/2010   LDLDIRECT 112.2 05/25/2013   TRIG 337.0* 05/25/2013   CHOLHDL 7 05/25/2013        Examination:   BP 118/68  Pulse 81  Temp(Src) 97.7 F (36.5 C)  Resp 14  Ht 5' 6.5" (1.689 m)  Wt 132 lb (59.875 kg)  BMI 20.99 kg/m2  SpO2 97%  LMP 06/25/2013  Body mass index is 20.99 kg/(m^2).    ASSESSMENT/ PLAN::   Diabetes type 1 with poor control and multiple complications    Blood glucose levels  are quite inconsistent as discussed in history of present illness. She is having low normal readings occasionally in the morning and around 6 PM but overall is not having consistent control Blood sugars tend to be highest mid day and occasionally late at night She is  also tending to overtreat low blood sugars with excessive amount of sugar  Overall however her blood sugars seem to be improving with using the pump and she is able to cover her meals with boluses better than without the pump  PLAN:  Basal rate 0.275 between midnight-9 AM;  9 AM = 0.45, 4 PM = 0.325 and 8 PM = 0.35 Avoid over correcting low readings Avoid large amounts of sweet drinks May use temporary basal for 6 hours after exercise Do not leave off the pump overnight  To have renal function checked again on the next visit . Counseling time over 50% of today's 25 minute visit  Elayne Snare 07/08/2013, 1:32 PM

## 2013-07-08 NOTE — Patient Instructions (Signed)
2 hrs after meal sugar also

## 2013-07-09 LAB — GLUCOSE, CAPILLARY
GLUCOSE-CAPILLARY: 104 mg/dL — AB (ref 70–99)
Glucose-Capillary: 161 mg/dL — ABNORMAL HIGH (ref 70–99)
Glucose-Capillary: 133 mg/dL — ABNORMAL HIGH (ref 70–99)

## 2013-07-10 LAB — GLUCOSE, CAPILLARY
GLUCOSE-CAPILLARY: 274 mg/dL — AB (ref 70–99)
GLUCOSE-CAPILLARY: 282 mg/dL — AB (ref 70–99)

## 2013-07-13 ENCOUNTER — Telehealth: Payer: Self-pay | Admitting: Endocrinology

## 2013-07-13 LAB — GLUCOSE, CAPILLARY
GLUCOSE-CAPILLARY: 571 mg/dL — AB (ref 70–99)
GLUCOSE-CAPILLARY: 114 mg/dL — AB (ref 70–99)
Glucose-Capillary: >600 mg/dL (ref 70–99)
Glucose-Capillary: >600 mg/dL (ref 70–99)

## 2013-07-13 NOTE — Telephone Encounter (Signed)
Pt

## 2013-07-14 LAB — GLUCOSE, CAPILLARY
Glucose-Capillary: 529 mg/dL — ABNORMAL HIGH (ref 70–99)
Glucose-Capillary: 254 mg/dL — ABNORMAL HIGH (ref 70–99)

## 2013-07-15 LAB — GLUCOSE, CAPILLARY
Glucose-Capillary: 222 mg/dL — ABNORMAL HIGH (ref 70–99)
Glucose-Capillary: 144 mg/dL — ABNORMAL HIGH (ref 70–99)

## 2013-07-16 LAB — GLUCOSE, CAPILLARY
Glucose-Capillary: 110 mg/dL — ABNORMAL HIGH (ref 70–99)
Glucose-Capillary: 211 mg/dL — ABNORMAL HIGH (ref 70–99)

## 2013-07-20 LAB — GLUCOSE, CAPILLARY
GLUCOSE-CAPILLARY: 193 mg/dL — AB (ref 70–99)
Glucose-Capillary: 192 mg/dL — ABNORMAL HIGH (ref 70–99)

## 2013-07-22 LAB — GLUCOSE, CAPILLARY
GLUCOSE-CAPILLARY: 271 mg/dL — AB (ref 70–99)
GLUCOSE-CAPILLARY: 308 mg/dL — AB (ref 70–99)
Glucose-Capillary: 248 mg/dL — ABNORMAL HIGH (ref 70–99)
Glucose-Capillary: 261 mg/dL — ABNORMAL HIGH (ref 70–99)

## 2013-07-24 LAB — GLUCOSE, CAPILLARY
GLUCOSE-CAPILLARY: 386 mg/dL — AB (ref 70–99)
Glucose-Capillary: 418 mg/dL — ABNORMAL HIGH (ref 70–99)

## 2013-07-27 LAB — GLUCOSE, CAPILLARY
GLUCOSE-CAPILLARY: 109 mg/dL — AB (ref 70–99)
Glucose-Capillary: 165 mg/dL — ABNORMAL HIGH (ref 70–99)
Glucose-Capillary: 78 mg/dL (ref 70–99)
Glucose-Capillary: 151 mg/dL — ABNORMAL HIGH (ref 70–99)
Glucose-Capillary: 349 mg/dL — ABNORMAL HIGH (ref 70–99)

## 2013-07-28 LAB — GLUCOSE, CAPILLARY
GLUCOSE-CAPILLARY: 211 mg/dL — AB (ref 70–99)
Glucose-Capillary: 228 mg/dL — ABNORMAL HIGH (ref 70–99)

## 2013-07-30 LAB — GLUCOSE, CAPILLARY
Glucose-Capillary: 260 mg/dL — ABNORMAL HIGH (ref 70–99)
Glucose-Capillary: 137 mg/dL — ABNORMAL HIGH (ref 70–99)

## 2013-07-31 ENCOUNTER — Other Ambulatory Visit (INDEPENDENT_AMBULATORY_CARE_PROVIDER_SITE_OTHER): Payer: Medicaid Other

## 2013-07-31 ENCOUNTER — Encounter (HOSPITAL_BASED_OUTPATIENT_CLINIC_OR_DEPARTMENT_OTHER): Payer: Medicaid Other | Attending: General Surgery

## 2013-07-31 DIAGNOSIS — L97509 Non-pressure chronic ulcer of other part of unspecified foot with unspecified severity: Secondary | ICD-10-CM | POA: Diagnosis not present

## 2013-07-31 DIAGNOSIS — E1065 Type 1 diabetes mellitus with hyperglycemia: Principal | ICD-10-CM

## 2013-07-31 DIAGNOSIS — E1169 Type 2 diabetes mellitus with other specified complication: Secondary | ICD-10-CM | POA: Insufficient documentation

## 2013-07-31 DIAGNOSIS — E1049 Type 1 diabetes mellitus with other diabetic neurological complication: Secondary | ICD-10-CM

## 2013-07-31 DIAGNOSIS — E7800 Pure hypercholesterolemia, unspecified: Secondary | ICD-10-CM

## 2013-07-31 LAB — LIPID PANEL
CHOL/HDL RATIO: 5
CHOLESTEROL: 206 mg/dL — AB (ref 0–200)
HDL: 38.4 mg/dL — AB (ref >39.00)
LDL Cholesterol: 128 mg/dL — ABNORMAL HIGH (ref 0–99)
Triglycerides: 199 mg/dL — ABNORMAL HIGH (ref 0.0–149.0)
VLDL: 39.8 mg/dL (ref 0.0–40.0)

## 2013-07-31 LAB — COMPREHENSIVE METABOLIC PANEL
ALT: 25 U/L (ref 0–35)
AST: 22 U/L (ref 0–37)
Albumin: 2.9 g/dL — ABNORMAL LOW (ref 3.5–5.2)
Alkaline Phosphatase: 110 U/L (ref 39–117)
BUN: 43 mg/dL — ABNORMAL HIGH (ref 6–23)
CALCIUM: 8.7 mg/dL (ref 8.4–10.5)
CHLORIDE: 103 meq/L (ref 96–112)
CO2: 23 meq/L (ref 19–32)
CREATININE: 1.3 mg/dL — AB (ref 0.4–1.2)
GFR: 48.67 mL/min — ABNORMAL LOW (ref >60.00)
GLUCOSE: 270 mg/dL — AB (ref 70–99)
Potassium: 3.8 mEq/L (ref 3.5–5.1)
Sodium: 134 mEq/L — ABNORMAL LOW (ref 135–145)
Total Bilirubin: 0.1 mg/dL — ABNORMAL LOW (ref 0.3–1.2)
Total Protein: 6.6 g/dL (ref 6.0–8.3)

## 2013-07-31 LAB — GLUCOSE, CAPILLARY
GLUCOSE-CAPILLARY: 182 mg/dL — AB (ref 70–99)
Glucose-Capillary: 147 mg/dL — ABNORMAL HIGH (ref 70–99)

## 2013-07-31 LAB — HEMOGLOBIN A1C: HEMOGLOBIN A1C: 11.8 % — AB (ref 4.6–6.5)

## 2013-08-03 DIAGNOSIS — E1169 Type 2 diabetes mellitus with other specified complication: Secondary | ICD-10-CM | POA: Diagnosis not present

## 2013-08-03 LAB — GLUCOSE, CAPILLARY
GLUCOSE-CAPILLARY: 227 mg/dL — AB (ref 70–99)
Glucose-Capillary: 215 mg/dL — ABNORMAL HIGH (ref 70–99)

## 2013-08-04 DIAGNOSIS — E1169 Type 2 diabetes mellitus with other specified complication: Secondary | ICD-10-CM | POA: Diagnosis not present

## 2013-08-04 LAB — GLUCOSE, CAPILLARY
GLUCOSE-CAPILLARY: 115 mg/dL — AB (ref 70–99)
Glucose-Capillary: 103 mg/dL — ABNORMAL HIGH (ref 70–99)
Glucose-Capillary: 92 mg/dL (ref 70–99)
Glucose-Capillary: 203 mg/dL — ABNORMAL HIGH (ref 70–99)

## 2013-08-05 ENCOUNTER — Ambulatory Visit (INDEPENDENT_AMBULATORY_CARE_PROVIDER_SITE_OTHER): Payer: Medicaid Other | Admitting: Endocrinology

## 2013-08-05 ENCOUNTER — Encounter: Payer: Self-pay | Admitting: Endocrinology

## 2013-08-05 VITALS — BP 102/60 | HR 78 | Temp 98.0°F | Ht 66.5 in | Wt 130.0 lb

## 2013-08-05 DIAGNOSIS — E1065 Type 1 diabetes mellitus with hyperglycemia: Principal | ICD-10-CM

## 2013-08-05 DIAGNOSIS — E1169 Type 2 diabetes mellitus with other specified complication: Secondary | ICD-10-CM | POA: Diagnosis not present

## 2013-08-05 DIAGNOSIS — E1049 Type 1 diabetes mellitus with other diabetic neurological complication: Secondary | ICD-10-CM

## 2013-08-05 DIAGNOSIS — N189 Chronic kidney disease, unspecified: Secondary | ICD-10-CM

## 2013-08-05 DIAGNOSIS — E7800 Pure hypercholesterolemia, unspecified: Secondary | ICD-10-CM

## 2013-08-05 LAB — GLUCOSE, CAPILLARY
GLUCOSE-CAPILLARY: 222 mg/dL — AB (ref 70–99)
GLUCOSE-CAPILLARY: 295 mg/dL — AB (ref 70–99)

## 2013-08-05 MED ORDER — PRAVASTATIN SODIUM 40 MG PO TABS: 20.0000 mg | ORAL_TABLET | Freq: Every day | ORAL | Status: DC

## 2013-08-05 NOTE — Progress Notes (Signed)
Patient ID: [name], female   DOB: 1971/03/08, 43 y.o.   MRN: [id number]   Reason for Appointment: Insulin pump follow-up   History of Present Illness   Diagnosis: Type 1 DIABETES MELITUS  Previous history: She has had long-standing poorly controlled diabetes and typically has poor compliance with self care measures despite periodic diabetes education and periodic followup  INSULIN PUMP regimen:  Basal rate: Midnight = 0.325, 9 AM = 0.4, 4 PM = 0.35. Total basal insulin 8.525 units daily  Carb coverage 1: 22 g and correction 1: 90 with target 150, active insulin 4 hours         Recent history:  Although she has started covering her meals somewhat better with using the insulin pump compared to multiple daily injections she is still having difficulty with control A1c is not any better Current blood sugar patterns:  Fasting glucose has been variable with readings between 73-400+.  She is not getting any overnight hypoglycemia which appeared off in the past  Blood sugars are progressively getting higher during the day  Blood sugars are almost always over 400 between about 2 PM-2 AM with only occasional readings below 350  Overall glucose average  recently as 412 which is much higher than on her last visit    Current problems identified:  She is suspending her insulin pump for at least 3 hours when she is going for hyperbaric oxygen in the morning although her blood sugars and not markedly high afterwards except occasionally  She is sometimes having mixed drinks in the evening without any insulin coverage for fear of hypoglycemia especially delayed hypoglycemia.  Difficult to identify her postprandial readings as she is often not checking her blood sugars after her meals which are mostly in the afternoon and evening  Inadequate basal rate between midday and late night because of markedly increased blood sugars. She does not think there are other reasons for her high blood  sugars  Not sure if she is covering on her snacks and carbohydrate intake consistently with boluses  Blood sugars are usually not coming down to target with correction boluses  Meals: 1 meal per day in the evening usually,  eating at variable times and also having snacks periodically     Physical activity: exercise: Only sporadically           Dietician visit: Most recent:?          Complications: are: Neuropathy, nephropathy, diabetic foot ulcer      Wt Readings from Last 3 Encounters:  08/05/13 130 lb (58.968 kg)  07/08/13 132 lb (59.875 kg)  06/26/13 131 lb 3.2 oz (59.512 kg)   Lab Results  Component Value Date   HGBA1C 11.8* 07/31/2013   HGBA1C 11.3* 04/08/2013   HGBA1C 13.5* 03/25/2013   Lab Results  Component Value Date   MICROALBUR 108.7* 04/08/2013   LDLCALC 128* 07/31/2013   CREATININE 1.3* 07/31/2013        Medication List       This list is accurate as of: 08/05/13 11:32 AM.  Always use your most recent med list.               albuterol 108 (90 BASE) MCG/ACT inhaler  Commonly known as:  PROVENTIL HFA;VENTOLIN HFA  Inhale 2 puffs into the lungs every 6 (six) hours as needed. S.O.B.     doxycycline 100 MG tablet  Commonly known as:  VIBRA-TABS  Take 1 tablet (100 mg total) by mouth every 12 (  twelve) hours. For 2 weeks     EPINEPHrine 0.3 mg/0.3 mL Soaj injection  Commonly known as:  EPIPEN  Inject 0.3 mLs (0.3 mg total) into the muscle as needed.     gabapentin 100 MG capsule  Commonly known as:  NEURONTIN  Take 100 mg by mouth.     insulin glargine 100 UNIT/ML injection  Commonly known as:  LANTUS  Inject 10-20 Units into the skin 2 (two) times daily. Take 16 units in am and 12 units at night     insulin lispro 100 UNIT/ML injection  Commonly known as:  HUMALOG  Inject 2-7 Units into the skin 3 (three) times daily before meals. Pt on sliding scale     losartan 25 MG tablet  Commonly known as:  COZAAR  Take 1 tablet (25 mg total) by mouth daily.      pravastatin 20 MG tablet  Commonly known as:  PRAVACHOL  Take 20 mg by mouth daily.     sodium bicarbonate 650 MG tablet  Take 1 tablet (650 mg total) by mouth 3 (three) times daily.     valACYclovir 500 MG tablet  Commonly known as:  VALTREX  Take 500 mg by mouth.        Allergies:  Allergies  Allergen Reactions  . Ciprofloxacin Itching  . Cleocin [Clindamycin Hcl]     Diarrhea     Past Medical History  Diagnosis Date  . Diabetes mellitus   . Neuropathy in diabetes   . Hypertension   . Hypercholesteremia   . H/O seasonal allergies   . Anemia   . Left foot infection     Past Surgical History  Procedure Laterality Date  . Cesarean section    . Eye surgery    . Hemrrhoidectomy    . Peripherally inserted central catheter insertion      No family history on file.  Social History:  reports that she quit smoking about 5 years ago. She has never used smokeless tobacco. She reports that she drinks about .5 ounces of alcohol per week. She reports that she does not use illicit drugs.  Review of Systems:  Hypertension:  Her lisinopril had been stopped because of hyperkalemia and higher creatinine levels.  She thinks she is taking losartan and now, creatinine is still relatively high although potassium is normal. Has history of proteinuria  Lipids: Has been  better compliant with pravastatin 20 mg but LDL is still high  Lab Results  Component Value Date   CHOL 206* 07/31/2013   HDL 38.40* 07/31/2013   LDLCALC 128* 07/31/2013   LDLDIRECT 112.2 05/25/2013   TRIG 199.0* 07/31/2013   CHOLHDL 5 07/31/2013        Examination:   BP 102/60  Pulse 78  Temp(Src) 98 F (36.7 C) (Oral)  Ht 5' 6.5" (1.689 m)  Wt 130 lb (58.968 kg)  BMI 20.67 kg/m2  SpO2 98%  Body mass index is 20.67 kg/(m^2).    ASSESSMENT/ PLAN::   Diabetes type 1 with poor control and multiple complications    Blood glucose levels  are quite poorly controlled and appear to be higher compared to her last  visit Not clear why her blood sugars are worse since her compliance with diet and boluses is not any worse Most likely she is requiring a larger basal rate in the afternoons and evenings Also most likely she will need higher doses of insulin at meals and for correcting high readings; however she is still quite  fearful of hypoglycemia especially overnight   PLAN:  Basal rate  0.5 between 12 noon-midnight Carbohydrate coverage 1:18 She will need to take boluses for all meals, snacks and even mixed alcoholic drinks She will be having a session with the diabetes nurse educator again for pump management and to help her with changes in the settings Do not leave off the pump overnight  Increase pravastatin to 40 mg  At this time no change in losartan .  Elayne Snare 08/05/2013, 11:32 AM

## 2013-08-05 NOTE — Patient Instructions (Signed)
Do not suspend for over 30 min at shower  Bolus for all carbs and also alcohol drinks  Check sugar with each meal,   Bolus for all high sugars

## 2013-08-06 DIAGNOSIS — E1169 Type 2 diabetes mellitus with other specified complication: Secondary | ICD-10-CM | POA: Diagnosis not present

## 2013-08-06 LAB — GLUCOSE, CAPILLARY
GLUCOSE-CAPILLARY: 281 mg/dL — AB (ref 70–99)
Glucose-Capillary: 265 mg/dL — ABNORMAL HIGH (ref 70–99)

## 2013-08-07 DIAGNOSIS — E1169 Type 2 diabetes mellitus with other specified complication: Secondary | ICD-10-CM | POA: Diagnosis not present

## 2013-08-07 LAB — GLUCOSE, CAPILLARY
Glucose-Capillary: 122 mg/dL — ABNORMAL HIGH (ref 70–99)
Glucose-Capillary: 123 mg/dL — ABNORMAL HIGH (ref 70–99)
Glucose-Capillary: 288 mg/dL — ABNORMAL HIGH (ref 70–99)
Glucose-Capillary: 345 mg/dL — ABNORMAL HIGH (ref 70–99)

## 2013-08-10 DIAGNOSIS — E1169 Type 2 diabetes mellitus with other specified complication: Secondary | ICD-10-CM | POA: Diagnosis not present

## 2013-08-10 LAB — GLUCOSE, CAPILLARY
Glucose-Capillary: 209 mg/dL — ABNORMAL HIGH (ref 70–99)
Glucose-Capillary: 223 mg/dL — ABNORMAL HIGH (ref 70–99)

## 2013-08-11 DIAGNOSIS — E1169 Type 2 diabetes mellitus with other specified complication: Secondary | ICD-10-CM | POA: Diagnosis not present

## 2013-08-11 LAB — GLUCOSE, CAPILLARY
Glucose-Capillary: 172 mg/dL — ABNORMAL HIGH (ref 70–99)
Glucose-Capillary: 133 mg/dL — ABNORMAL HIGH (ref 70–99)

## 2013-08-11 LAB — HM DIABETES EYE EXAM

## 2013-08-12 DIAGNOSIS — E1169 Type 2 diabetes mellitus with other specified complication: Secondary | ICD-10-CM | POA: Diagnosis not present

## 2013-08-12 LAB — GLUCOSE, CAPILLARY
GLUCOSE-CAPILLARY: 63 mg/dL — AB (ref 70–99)
Glucose-Capillary: 101 mg/dL — ABNORMAL HIGH (ref 70–99)
Glucose-Capillary: 86 mg/dL (ref 70–99)

## 2013-08-13 DIAGNOSIS — E1169 Type 2 diabetes mellitus with other specified complication: Secondary | ICD-10-CM | POA: Diagnosis not present

## 2013-08-13 LAB — GLUCOSE, CAPILLARY
GLUCOSE-CAPILLARY: 247 mg/dL — AB (ref 70–99)
Glucose-Capillary: 226 mg/dL — ABNORMAL HIGH (ref 70–99)

## 2013-08-14 DIAGNOSIS — E1169 Type 2 diabetes mellitus with other specified complication: Secondary | ICD-10-CM | POA: Diagnosis not present

## 2013-08-14 LAB — GLUCOSE, CAPILLARY
Glucose-Capillary: 409 mg/dL — ABNORMAL HIGH (ref 70–99)
Glucose-Capillary: 355 mg/dL — ABNORMAL HIGH (ref 70–99)

## 2013-08-17 DIAGNOSIS — E1169 Type 2 diabetes mellitus with other specified complication: Secondary | ICD-10-CM | POA: Diagnosis not present

## 2013-08-17 LAB — GLUCOSE, CAPILLARY: GLUCOSE-CAPILLARY: 376 mg/dL — AB (ref 70–99)

## 2013-08-18 DIAGNOSIS — E1169 Type 2 diabetes mellitus with other specified complication: Secondary | ICD-10-CM | POA: Diagnosis not present

## 2013-08-18 LAB — GLUCOSE, CAPILLARY
Glucose-Capillary: 140 mg/dL — ABNORMAL HIGH (ref 70–99)
Glucose-Capillary: 153 mg/dL — ABNORMAL HIGH (ref 70–99)

## 2013-08-19 DIAGNOSIS — E1169 Type 2 diabetes mellitus with other specified complication: Secondary | ICD-10-CM | POA: Diagnosis not present

## 2013-08-19 LAB — GLUCOSE, CAPILLARY
GLUCOSE-CAPILLARY: 267 mg/dL — AB (ref 70–99)
Glucose-Capillary: 176 mg/dL — ABNORMAL HIGH (ref 70–99)

## 2013-08-20 DIAGNOSIS — E1169 Type 2 diabetes mellitus with other specified complication: Secondary | ICD-10-CM | POA: Diagnosis not present

## 2013-08-20 LAB — GLUCOSE, CAPILLARY
GLUCOSE-CAPILLARY: 240 mg/dL — AB (ref 70–99)
Glucose-Capillary: 145 mg/dL — ABNORMAL HIGH (ref 70–99)

## 2013-08-21 DIAGNOSIS — E1169 Type 2 diabetes mellitus with other specified complication: Secondary | ICD-10-CM | POA: Diagnosis not present

## 2013-08-21 LAB — GLUCOSE, CAPILLARY
GLUCOSE-CAPILLARY: 346 mg/dL — AB (ref 70–99)
GLUCOSE-CAPILLARY: 254 mg/dL — AB (ref 70–99)

## 2013-08-25 DIAGNOSIS — E1169 Type 2 diabetes mellitus with other specified complication: Secondary | ICD-10-CM | POA: Diagnosis not present

## 2013-08-25 LAB — GLUCOSE, CAPILLARY
GLUCOSE-CAPILLARY: 243 mg/dL — AB (ref 70–99)
Glucose-Capillary: 225 mg/dL — ABNORMAL HIGH (ref 70–99)

## 2013-08-26 DIAGNOSIS — E1169 Type 2 diabetes mellitus with other specified complication: Secondary | ICD-10-CM | POA: Diagnosis not present

## 2013-08-26 LAB — GLUCOSE, CAPILLARY
GLUCOSE-CAPILLARY: 149 mg/dL — AB (ref 70–99)
Glucose-Capillary: 168 mg/dL — ABNORMAL HIGH (ref 70–99)

## 2013-08-27 DIAGNOSIS — E1169 Type 2 diabetes mellitus with other specified complication: Secondary | ICD-10-CM | POA: Diagnosis not present

## 2013-08-27 LAB — GLUCOSE, CAPILLARY
GLUCOSE-CAPILLARY: 261 mg/dL — AB (ref 70–99)
Glucose-Capillary: 200 mg/dL — ABNORMAL HIGH (ref 70–99)
Glucose-Capillary: 216 mg/dL — ABNORMAL HIGH (ref 70–99)

## 2013-08-28 DIAGNOSIS — E1169 Type 2 diabetes mellitus with other specified complication: Secondary | ICD-10-CM | POA: Diagnosis not present

## 2013-08-28 LAB — GLUCOSE, CAPILLARY
Glucose-Capillary: 200 mg/dL — ABNORMAL HIGH (ref 70–99)
Glucose-Capillary: 221 mg/dL — ABNORMAL HIGH (ref 70–99)

## 2013-08-31 ENCOUNTER — Other Ambulatory Visit: Payer: Self-pay | Admitting: *Deleted

## 2013-08-31 ENCOUNTER — Encounter (HOSPITAL_BASED_OUTPATIENT_CLINIC_OR_DEPARTMENT_OTHER): Payer: Medicaid Other | Attending: General Surgery

## 2013-08-31 DIAGNOSIS — L97509 Non-pressure chronic ulcer of other part of unspecified foot with unspecified severity: Secondary | ICD-10-CM | POA: Insufficient documentation

## 2013-08-31 DIAGNOSIS — E1169 Type 2 diabetes mellitus with other specified complication: Secondary | ICD-10-CM | POA: Insufficient documentation

## 2013-09-01 DIAGNOSIS — L97509 Non-pressure chronic ulcer of other part of unspecified foot with unspecified severity: Secondary | ICD-10-CM | POA: Diagnosis not present

## 2013-09-01 DIAGNOSIS — E1169 Type 2 diabetes mellitus with other specified complication: Secondary | ICD-10-CM | POA: Diagnosis not present

## 2013-09-01 LAB — GLUCOSE, CAPILLARY
GLUCOSE-CAPILLARY: 349 mg/dL — AB (ref 70–99)
GLUCOSE-CAPILLARY: 230 mg/dL — AB (ref 70–99)
Glucose-Capillary: 102 mg/dL — ABNORMAL HIGH (ref 70–99)
Glucose-Capillary: 110 mg/dL — ABNORMAL HIGH (ref 70–99)
Glucose-Capillary: 93 mg/dL (ref 70–99)
Glucose-Capillary: 216 mg/dL — ABNORMAL HIGH (ref 70–99)

## 2013-09-02 ENCOUNTER — Telehealth: Payer: Self-pay | Admitting: Endocrinology

## 2013-09-02 ENCOUNTER — Other Ambulatory Visit: Payer: Self-pay | Admitting: *Deleted

## 2013-09-02 DIAGNOSIS — L97509 Non-pressure chronic ulcer of other part of unspecified foot with unspecified severity: Secondary | ICD-10-CM | POA: Diagnosis not present

## 2013-09-02 DIAGNOSIS — E1169 Type 2 diabetes mellitus with other specified complication: Secondary | ICD-10-CM | POA: Diagnosis not present

## 2013-09-02 LAB — GLUCOSE, CAPILLARY
GLUCOSE-CAPILLARY: 349 mg/dL — AB (ref 70–99)
GLUCOSE-CAPILLARY: 144 mg/dL — AB (ref 70–99)
Glucose-Capillary: >600 mg/dL (ref 70–99)
Glucose-Capillary: >600 mg/dL (ref 70–99)

## 2013-09-02 MED ORDER — INSULIN ASPART 100 UNIT/ML ~~LOC~~ SOLN: SUBCUTANEOUS | Status: DC

## 2013-09-02 NOTE — Telephone Encounter (Signed)
rx sent

## 2013-09-02 NOTE — Telephone Encounter (Signed)
Please see below.

## 2013-09-02 NOTE — Telephone Encounter (Signed)
Patient reports that she needs her insulin called in (Novolog) for her insulin pump.

## 2013-09-02 NOTE — Telephone Encounter (Signed)
Rx for pump she states needs rx  Pharmacy: [facility]   Thank You :)

## 2013-09-03 DIAGNOSIS — L97509 Non-pressure chronic ulcer of other part of unspecified foot with unspecified severity: Secondary | ICD-10-CM | POA: Diagnosis not present

## 2013-09-03 DIAGNOSIS — E1169 Type 2 diabetes mellitus with other specified complication: Secondary | ICD-10-CM | POA: Diagnosis not present

## 2013-09-03 LAB — GLUCOSE, CAPILLARY
Glucose-Capillary: 155 mg/dL — ABNORMAL HIGH (ref 70–99)
Glucose-Capillary: 159 mg/dL — ABNORMAL HIGH (ref 70–99)

## 2013-09-04 DIAGNOSIS — L97509 Non-pressure chronic ulcer of other part of unspecified foot with unspecified severity: Secondary | ICD-10-CM | POA: Diagnosis not present

## 2013-09-04 DIAGNOSIS — E1169 Type 2 diabetes mellitus with other specified complication: Secondary | ICD-10-CM | POA: Diagnosis not present

## 2013-09-04 LAB — GLUCOSE, CAPILLARY
GLUCOSE-CAPILLARY: 219 mg/dL — AB (ref 70–99)
Glucose-Capillary: 244 mg/dL — ABNORMAL HIGH (ref 70–99)

## 2013-09-07 DIAGNOSIS — E1169 Type 2 diabetes mellitus with other specified complication: Secondary | ICD-10-CM | POA: Diagnosis not present

## 2013-09-07 DIAGNOSIS — L97509 Non-pressure chronic ulcer of other part of unspecified foot with unspecified severity: Secondary | ICD-10-CM | POA: Diagnosis not present

## 2013-09-07 LAB — GLUCOSE, CAPILLARY
GLUCOSE-CAPILLARY: 292 mg/dL — AB (ref 70–99)
Glucose-Capillary: 267 mg/dL — ABNORMAL HIGH (ref 70–99)

## 2013-09-08 DIAGNOSIS — L97509 Non-pressure chronic ulcer of other part of unspecified foot with unspecified severity: Secondary | ICD-10-CM | POA: Diagnosis not present

## 2013-09-08 DIAGNOSIS — E1169 Type 2 diabetes mellitus with other specified complication: Secondary | ICD-10-CM | POA: Diagnosis not present

## 2013-09-08 LAB — GLUCOSE, CAPILLARY
GLUCOSE-CAPILLARY: 250 mg/dL — AB (ref 70–99)
Glucose-Capillary: 368 mg/dL — ABNORMAL HIGH (ref 70–99)

## 2013-09-09 ENCOUNTER — Ambulatory Visit: Payer: Medicaid Other | Admitting: Endocrinology

## 2013-09-09 ENCOUNTER — Other Ambulatory Visit: Payer: Medicaid Other

## 2013-09-09 DIAGNOSIS — L97509 Non-pressure chronic ulcer of other part of unspecified foot with unspecified severity: Secondary | ICD-10-CM | POA: Diagnosis not present

## 2013-09-09 DIAGNOSIS — E1169 Type 2 diabetes mellitus with other specified complication: Secondary | ICD-10-CM | POA: Diagnosis not present

## 2013-09-09 LAB — GLUCOSE, CAPILLARY
Glucose-Capillary: 161 mg/dL — ABNORMAL HIGH (ref 70–99)
Glucose-Capillary: 161 mg/dL — ABNORMAL HIGH (ref 70–99)

## 2013-09-10 DIAGNOSIS — E1169 Type 2 diabetes mellitus with other specified complication: Secondary | ICD-10-CM | POA: Diagnosis not present

## 2013-09-10 DIAGNOSIS — L97509 Non-pressure chronic ulcer of other part of unspecified foot with unspecified severity: Secondary | ICD-10-CM | POA: Diagnosis not present

## 2013-09-10 LAB — GLUCOSE, CAPILLARY
GLUCOSE-CAPILLARY: 370 mg/dL — AB (ref 70–99)
GLUCOSE-CAPILLARY: 269 mg/dL — AB (ref 70–99)

## 2013-09-11 DIAGNOSIS — E1169 Type 2 diabetes mellitus with other specified complication: Secondary | ICD-10-CM | POA: Diagnosis not present

## 2013-09-11 DIAGNOSIS — L97509 Non-pressure chronic ulcer of other part of unspecified foot with unspecified severity: Secondary | ICD-10-CM | POA: Diagnosis not present

## 2013-09-14 DIAGNOSIS — E1169 Type 2 diabetes mellitus with other specified complication: Secondary | ICD-10-CM | POA: Diagnosis not present

## 2013-09-14 DIAGNOSIS — L97509 Non-pressure chronic ulcer of other part of unspecified foot with unspecified severity: Secondary | ICD-10-CM | POA: Diagnosis not present

## 2013-09-14 LAB — GLUCOSE, CAPILLARY
GLUCOSE-CAPILLARY: 165 mg/dL — AB (ref 70–99)
GLUCOSE-CAPILLARY: 378 mg/dL — AB (ref 70–99)
Glucose-Capillary: 159 mg/dL — ABNORMAL HIGH (ref 70–99)
Glucose-Capillary: 330 mg/dL — ABNORMAL HIGH (ref 70–99)

## 2013-09-15 DIAGNOSIS — L97509 Non-pressure chronic ulcer of other part of unspecified foot with unspecified severity: Secondary | ICD-10-CM | POA: Diagnosis not present

## 2013-09-15 DIAGNOSIS — E1169 Type 2 diabetes mellitus with other specified complication: Secondary | ICD-10-CM | POA: Diagnosis not present

## 2013-09-15 LAB — GLUCOSE, CAPILLARY
Glucose-Capillary: 418 mg/dL — ABNORMAL HIGH (ref 70–99)
Glucose-Capillary: 307 mg/dL — ABNORMAL HIGH (ref 70–99)

## 2013-09-16 DIAGNOSIS — L97509 Non-pressure chronic ulcer of other part of unspecified foot with unspecified severity: Secondary | ICD-10-CM | POA: Diagnosis not present

## 2013-09-16 DIAGNOSIS — E1169 Type 2 diabetes mellitus with other specified complication: Secondary | ICD-10-CM | POA: Diagnosis not present

## 2013-09-16 LAB — GLUCOSE, CAPILLARY
Glucose-Capillary: 335 mg/dL — ABNORMAL HIGH (ref 70–99)
Glucose-Capillary: 258 mg/dL — ABNORMAL HIGH (ref 70–99)

## 2013-09-17 DIAGNOSIS — L97509 Non-pressure chronic ulcer of other part of unspecified foot with unspecified severity: Secondary | ICD-10-CM | POA: Diagnosis not present

## 2013-09-17 DIAGNOSIS — E1169 Type 2 diabetes mellitus with other specified complication: Secondary | ICD-10-CM | POA: Diagnosis not present

## 2013-09-17 LAB — GLUCOSE, CAPILLARY
Glucose-Capillary: 286 mg/dL — ABNORMAL HIGH (ref 70–99)
Glucose-Capillary: 320 mg/dL — ABNORMAL HIGH (ref 70–99)

## 2013-09-18 DIAGNOSIS — L97509 Non-pressure chronic ulcer of other part of unspecified foot with unspecified severity: Secondary | ICD-10-CM | POA: Diagnosis not present

## 2013-09-18 DIAGNOSIS — E1169 Type 2 diabetes mellitus with other specified complication: Secondary | ICD-10-CM | POA: Diagnosis not present

## 2013-09-18 LAB — GLUCOSE, CAPILLARY
GLUCOSE-CAPILLARY: 324 mg/dL — AB (ref 70–99)
GLUCOSE-CAPILLARY: 273 mg/dL — AB (ref 70–99)

## 2013-09-21 DIAGNOSIS — L97509 Non-pressure chronic ulcer of other part of unspecified foot with unspecified severity: Secondary | ICD-10-CM | POA: Diagnosis not present

## 2013-09-21 DIAGNOSIS — E1169 Type 2 diabetes mellitus with other specified complication: Secondary | ICD-10-CM | POA: Diagnosis not present

## 2013-09-21 LAB — GLUCOSE, CAPILLARY
GLUCOSE-CAPILLARY: 251 mg/dL — AB (ref 70–99)
GLUCOSE-CAPILLARY: 208 mg/dL — AB (ref 70–99)

## 2013-09-22 DIAGNOSIS — E1169 Type 2 diabetes mellitus with other specified complication: Secondary | ICD-10-CM | POA: Diagnosis not present

## 2013-09-22 DIAGNOSIS — L97509 Non-pressure chronic ulcer of other part of unspecified foot with unspecified severity: Secondary | ICD-10-CM | POA: Diagnosis not present

## 2013-09-22 LAB — GLUCOSE, CAPILLARY
GLUCOSE-CAPILLARY: 359 mg/dL — AB (ref 70–99)
GLUCOSE-CAPILLARY: 334 mg/dL — AB (ref 70–99)

## 2013-09-23 DIAGNOSIS — E1169 Type 2 diabetes mellitus with other specified complication: Secondary | ICD-10-CM | POA: Diagnosis not present

## 2013-09-23 DIAGNOSIS — L97509 Non-pressure chronic ulcer of other part of unspecified foot with unspecified severity: Secondary | ICD-10-CM | POA: Diagnosis not present

## 2013-09-23 LAB — GLUCOSE, CAPILLARY
Glucose-Capillary: 237 mg/dL — ABNORMAL HIGH (ref 70–99)
Glucose-Capillary: 247 mg/dL — ABNORMAL HIGH (ref 70–99)

## 2013-09-24 DIAGNOSIS — E1169 Type 2 diabetes mellitus with other specified complication: Secondary | ICD-10-CM | POA: Diagnosis not present

## 2013-09-24 DIAGNOSIS — L97509 Non-pressure chronic ulcer of other part of unspecified foot with unspecified severity: Secondary | ICD-10-CM | POA: Diagnosis not present

## 2013-09-24 LAB — GLUCOSE, CAPILLARY
GLUCOSE-CAPILLARY: 161 mg/dL — AB (ref 70–99)
Glucose-Capillary: 152 mg/dL — ABNORMAL HIGH (ref 70–99)

## 2013-09-25 ENCOUNTER — Other Ambulatory Visit (INDEPENDENT_AMBULATORY_CARE_PROVIDER_SITE_OTHER): Payer: Medicaid Other

## 2013-09-25 DIAGNOSIS — E1065 Type 1 diabetes mellitus with hyperglycemia: Principal | ICD-10-CM

## 2013-09-25 DIAGNOSIS — E1169 Type 2 diabetes mellitus with other specified complication: Secondary | ICD-10-CM | POA: Diagnosis not present

## 2013-09-25 DIAGNOSIS — E1049 Type 1 diabetes mellitus with other diabetic neurological complication: Secondary | ICD-10-CM

## 2013-09-25 DIAGNOSIS — L97509 Non-pressure chronic ulcer of other part of unspecified foot with unspecified severity: Secondary | ICD-10-CM | POA: Diagnosis not present

## 2013-09-25 LAB — GLUCOSE, CAPILLARY
GLUCOSE-CAPILLARY: 253 mg/dL — AB (ref 70–99)
Glucose-Capillary: 215 mg/dL — ABNORMAL HIGH (ref 70–99)

## 2013-09-25 LAB — RENAL FUNCTION PANEL
ALBUMIN: 3.1 g/dL — AB (ref 3.5–5.2)
BUN: 34 mg/dL — AB (ref 6–23)
CALCIUM: 8.6 mg/dL (ref 8.4–10.5)
CHLORIDE: 102 meq/L (ref 96–112)
CO2: 24 meq/L (ref 19–32)
Creatinine, Ser: 1.4 mg/dL — ABNORMAL HIGH (ref 0.4–1.2)
GFR: 42.8 mL/min — ABNORMAL LOW (ref >60.00)
Glucose, Bld: 326 mg/dL — ABNORMAL HIGH (ref 70–99)
PHOSPHORUS: 4.5 mg/dL (ref 2.3–4.6)
POTASSIUM: 4.9 meq/L (ref 3.5–5.1)
Sodium: 133 mEq/L — ABNORMAL LOW (ref 135–145)

## 2013-09-28 DIAGNOSIS — L97509 Non-pressure chronic ulcer of other part of unspecified foot with unspecified severity: Secondary | ICD-10-CM | POA: Diagnosis not present

## 2013-09-28 DIAGNOSIS — E1169 Type 2 diabetes mellitus with other specified complication: Secondary | ICD-10-CM | POA: Diagnosis not present

## 2013-09-28 LAB — GLUCOSE, CAPILLARY
GLUCOSE-CAPILLARY: 187 mg/dL — AB (ref 70–99)
Glucose-Capillary: 218 mg/dL — ABNORMAL HIGH (ref 70–99)

## 2013-09-29 DIAGNOSIS — L97509 Non-pressure chronic ulcer of other part of unspecified foot with unspecified severity: Secondary | ICD-10-CM | POA: Diagnosis not present

## 2013-09-29 DIAGNOSIS — E1169 Type 2 diabetes mellitus with other specified complication: Secondary | ICD-10-CM | POA: Diagnosis not present

## 2013-09-30 ENCOUNTER — Ambulatory Visit: Payer: Medicaid Other | Admitting: Endocrinology

## 2013-09-30 ENCOUNTER — Encounter (HOSPITAL_BASED_OUTPATIENT_CLINIC_OR_DEPARTMENT_OTHER): Payer: Medicaid Other | Attending: General Surgery

## 2013-09-30 DIAGNOSIS — E1169 Type 2 diabetes mellitus with other specified complication: Secondary | ICD-10-CM | POA: Diagnosis present

## 2013-09-30 DIAGNOSIS — L97509 Non-pressure chronic ulcer of other part of unspecified foot with unspecified severity: Secondary | ICD-10-CM | POA: Diagnosis not present

## 2013-09-30 LAB — GLUCOSE, CAPILLARY
Glucose-Capillary: 169 mg/dL — ABNORMAL HIGH (ref 70–99)
Glucose-Capillary: 415 mg/dL — ABNORMAL HIGH (ref 70–99)
Glucose-Capillary: 318 mg/dL — ABNORMAL HIGH (ref 70–99)
Glucose-Capillary: 288 mg/dL — ABNORMAL HIGH (ref 70–99)
Glucose-Capillary: 229 mg/dL — ABNORMAL HIGH (ref 70–99)

## 2013-10-19 ENCOUNTER — Encounter (HOSPITAL_BASED_OUTPATIENT_CLINIC_OR_DEPARTMENT_OTHER): Payer: Medicaid Other

## 2013-10-19 ENCOUNTER — Ambulatory Visit: Payer: Medicaid Other | Admitting: Endocrinology

## 2013-10-19 DIAGNOSIS — E1169 Type 2 diabetes mellitus with other specified complication: Secondary | ICD-10-CM | POA: Diagnosis not present

## 2013-10-19 DIAGNOSIS — L97509 Non-pressure chronic ulcer of other part of unspecified foot with unspecified severity: Secondary | ICD-10-CM | POA: Diagnosis not present

## 2013-10-21 DIAGNOSIS — E1169 Type 2 diabetes mellitus with other specified complication: Secondary | ICD-10-CM | POA: Diagnosis not present

## 2013-10-21 DIAGNOSIS — L97509 Non-pressure chronic ulcer of other part of unspecified foot with unspecified severity: Secondary | ICD-10-CM | POA: Diagnosis not present

## 2013-10-23 ENCOUNTER — Ambulatory Visit: Payer: Medicaid Other | Admitting: Endocrinology

## 2013-10-24 ENCOUNTER — Other Ambulatory Visit: Payer: Self-pay | Admitting: Endocrinology

## 2013-10-26 DIAGNOSIS — E1169 Type 2 diabetes mellitus with other specified complication: Secondary | ICD-10-CM | POA: Diagnosis not present

## 2013-10-26 DIAGNOSIS — L97509 Non-pressure chronic ulcer of other part of unspecified foot with unspecified severity: Secondary | ICD-10-CM | POA: Diagnosis not present

## 2013-10-30 ENCOUNTER — Encounter (HOSPITAL_BASED_OUTPATIENT_CLINIC_OR_DEPARTMENT_OTHER): Payer: Self-pay | Admitting: Emergency Medicine

## 2013-10-30 ENCOUNTER — Emergency Department (HOSPITAL_BASED_OUTPATIENT_CLINIC_OR_DEPARTMENT_OTHER)
Admission: EM | Admit: 2013-10-30 | Discharge: 2013-10-30 | Disposition: A | Discharge status: Home / Self Care | Payer: Medicaid Other | Attending: Emergency Medicine | Admitting: Emergency Medicine

## 2013-10-30 ENCOUNTER — Emergency Department (HOSPITAL_BASED_OUTPATIENT_CLINIC_OR_DEPARTMENT_OTHER): Payer: Medicaid Other

## 2013-10-30 DIAGNOSIS — S99919A Unspecified injury of unspecified ankle, initial encounter: Secondary | ICD-10-CM | POA: Diagnosis not present

## 2013-10-30 DIAGNOSIS — Y929 Unspecified place or not applicable: Secondary | ICD-10-CM | POA: Diagnosis not present

## 2013-10-30 DIAGNOSIS — E1169 Type 2 diabetes mellitus with other specified complication: Secondary | ICD-10-CM | POA: Diagnosis not present

## 2013-10-30 DIAGNOSIS — I10 Essential (primary) hypertension: Secondary | ICD-10-CM | POA: Diagnosis not present

## 2013-10-30 DIAGNOSIS — Z862 Personal history of diseases of the blood and blood-forming organs and certain disorders involving the immune mechanism: Secondary | ICD-10-CM | POA: Insufficient documentation

## 2013-10-30 DIAGNOSIS — E7800 Pure hypercholesterolemia, unspecified: Secondary | ICD-10-CM | POA: Insufficient documentation

## 2013-10-30 DIAGNOSIS — W108XXA Fall (on) (from) other stairs and steps, initial encounter: Secondary | ICD-10-CM | POA: Insufficient documentation

## 2013-10-30 DIAGNOSIS — E1142 Type 2 diabetes mellitus with diabetic polyneuropathy: Secondary | ICD-10-CM | POA: Insufficient documentation

## 2013-10-30 DIAGNOSIS — Z8619 Personal history of other infectious and parasitic diseases: Secondary | ICD-10-CM | POA: Insufficient documentation

## 2013-10-30 DIAGNOSIS — Z87891 Personal history of nicotine dependence: Secondary | ICD-10-CM | POA: Insufficient documentation

## 2013-10-30 DIAGNOSIS — S8990XA Unspecified injury of unspecified lower leg, initial encounter: Secondary | ICD-10-CM | POA: Insufficient documentation

## 2013-10-30 DIAGNOSIS — Z79899 Other long term (current) drug therapy: Secondary | ICD-10-CM | POA: Diagnosis not present

## 2013-10-30 DIAGNOSIS — L97509 Non-pressure chronic ulcer of other part of unspecified foot with unspecified severity: Secondary | ICD-10-CM | POA: Diagnosis not present

## 2013-10-30 DIAGNOSIS — M25562 Pain in left knee: Secondary | ICD-10-CM

## 2013-10-30 DIAGNOSIS — Z794 Long term (current) use of insulin: Secondary | ICD-10-CM | POA: Diagnosis not present

## 2013-10-30 DIAGNOSIS — Y9389 Activity, other specified: Secondary | ICD-10-CM | POA: Diagnosis not present

## 2013-10-30 DIAGNOSIS — E1149 Type 2 diabetes mellitus with other diabetic neurological complication: Secondary | ICD-10-CM | POA: Insufficient documentation

## 2013-10-30 DIAGNOSIS — S99929A Unspecified injury of unspecified foot, initial encounter: Principal | ICD-10-CM

## 2013-10-30 MED ORDER — OXYCODONE-ACETAMINOPHEN 5-325 MG PO TABS: 1.0000 | ORAL_TABLET | Freq: Four times a day (QID) | ORAL | Status: DC | PRN

## 2013-10-30 NOTE — ED Provider Notes (Signed)
CSN: [id number]     Arrival date & time 10/30/13  2108 History  This chart was scribed for No att. providers found by Donato Schultz, ED Scribe. This patient was seen in room MHOTF/OTF and the patient's care was started at 10:13 PM.     Chief Complaint  Patient presents with  . Knee Pain    Patient is a 43 y.o. female presenting with knee pain. The history is provided by the patient. No language interpreter was used.  Knee Pain  HPI Comments: [name] is a 43 y.o. female who presents to the Emergency Department complaining of constant left knee pain with associated swelling that started a 10 days ago after she fell down steps while in a cast.  She was not using any crutches while she was in her cast.  She denies having an infection in her bone.  She has not taken anything for pain.  She has not seen an orthopedist for her symptoms.  She is allergic to Cipro.  Pain is worse with palpation, movement and walking.  No fever or redness.  There are no other associated systemic symptoms, there are no other alleviating or modifying factors.   Past Medical History  Diagnosis Date  . Diabetes mellitus   . Neuropathy in diabetes   . Hypertension   . Hypercholesteremia   . H/O seasonal allergies   . Anemia   . Left foot infection    Past Surgical History  Procedure Laterality Date  . Cesarean section    . Eye surgery    . Hemrrhoidectomy    . Peripherally inserted central catheter insertion     History reviewed. No pertinent family history. History  Substance Use Topics  . Smoking status: Former Smoker    Quit date: 05/16/2008  . Smokeless tobacco: Never Used  . Alcohol Use: 0.5 oz/week    1 drink(s) per week     Comment: Occasional   OB History   Grav Para Term Preterm Abortions TAB SAB Ect Mult Living                 Review of Systems  Musculoskeletal: Positive for arthralgias, gait problem and joint swelling.  All other systems reviewed and are  negative.     Allergies  Ciprofloxacin and Cleocin  Home Medications   Prior to Admission medications   Medication Sig Start Date End Date Taking? Authorizing Provider  albuterol (PROVENTIL HFA;VENTOLIN HFA) 108 (90 BASE) MCG/ACT inhaler Inhale 2 puffs into the lungs every 6 (six) hours as needed. S.O.B.    Historical Provider, MD  doxycycline (VIBRA-TABS) 100 MG tablet Take 1 tablet (100 mg total) by mouth every 12 (twelve) hours. For 2 weeks 06/15/13   Bonnielee Haff, MD  EPINEPHrine (EPIPEN) 0.3 mg/0.3 mL SOAJ injection Inject 0.3 mLs (0.3 mg total) into the muscle as needed. 06/12/13   Varney Biles, MD  gabapentin (NEURONTIN) 100 MG capsule Take 100 mg by mouth. 06/16/13 06/16/14  Historical Provider, MD  insulin aspart (NOVOLOG) 100 UNIT/ML injection Use max 65 units per day with pump 09/02/13   Elayne Snare, MD  insulin glargine (LANTUS) 100 UNIT/ML injection Inject 10-20 Units into the skin 2 (two) times daily. Take 16 units in am and 12 units at night 06/15/13   Bonnielee Haff, MD  insulin lispro (HUMALOG) 100 UNIT/ML injection Inject 2-7 Units into the skin 3 (three) times daily before meals. Pt on sliding scale    Historical Provider, MD  losartan (COZAAR) 25  MG tablet Take 1 tablet (25 mg total) by mouth daily. 06/15/13   Bonnielee Haff, MD  oxyCODONE-acetaminophen (PERCOCET/ROXICET) 5-325 MG per tablet Take 1-2 tablets by mouth every 6 (six) hours as needed for severe pain. 10/30/13   Threasa Beards, MD  pravastatin (PRAVACHOL) 40 MG tablet Take 0.5 tablets (20 mg total) by mouth daily. 08/05/13   Elayne Snare, MD  sodium bicarbonate 650 MG tablet Take 1 tablet (650 mg total) by mouth 3 (three) times daily. 06/15/13   Bonnielee Haff, MD  valACYclovir (VALTREX) 500 MG tablet Take 500 mg by mouth. 06/16/13 06/17/14  Historical Provider, MD   Triage Vitals: BP 153/83  Pulse 97  Temp(Src) 98.1 F (36.7 C) (Oral)  Resp 16  Ht 5' 6.5" (1.689 m)  Wt 135 lb (61.236 kg)  BMI 21.47 kg/m2  SpO2  100%  LMP 10/18/2013  Physical Exam  Nursing note and vitals reviewed. Constitutional: She is oriented to person, place, and time. She appears well-developed and well-nourished.  HENT:  Head: Normocephalic and atraumatic.  Eyes: EOM are normal.  Neck: Normal range of motion.  Cardiovascular: Normal rate, regular rhythm and normal heart sounds.  Exam reveals no gallop and no friction rub.   No murmur heard. Pulses:      Dorsalis pedis pulses are 2+ on the right side, and 2+ on the left side.  Pulmonary/Chest: Effort normal and breath sounds normal. No respiratory distress. She has no wheezes. She has no rales.  Abdominal:  Insulin pump in place.   Musculoskeletal: Normal range of motion.  Left knee tender to palpation along the anterior medial surface.  No medial or lateral joint line tenderness.  No tenderness to palpation of the patella.  No erythema or warmth overlying the knee.  Multiple well-healed wounds on the sole of her foot.    Neurological: She is alert and oriented to person, place, and time.  Skin: Skin is warm and dry.  Psychiatric: She has a normal mood and affect. Her behavior is normal.    ED Course  Procedures (including critical care time)  DIAGNOSTIC STUDIES: Oxygen Saturation is 100% on room air, normal by my interpretation.    COORDINATION OF CARE: 10:16 PM- Discussed discharging the patient with a knee sleeve, pain medication, a referral to orthopedics, and a work note.  The patient agreed to the treatment plan.    Labs Review Labs Reviewed - No data to display  Imaging Review Dg Knee Complete 4 Views Left  10/30/2013   CLINICAL DATA:  Patient fell down steps 1-1/2 weeks ago with knee injury. Still with distal and medial pain.  EXAM: LEFT KNEE - COMPLETE 4+ VIEW  COMPARISON:  06/21/2013  FINDINGS: There is no evidence of fracture, dislocation, or joint effusion. There is no evidence of arthropathy or other focal bone abnormality. Soft tissues are  unremarkable.  IMPRESSION: Negative.   Electronically Signed   By: Lucienne Capers M.D.   On: 10/30/2013 21:56     EKG Interpretation None      MDM   Final diagnoses:  Knee pain, acute, left    Pt presenting with c/o pain in left knee- states pain has been present for 10 days after a fall while she was in a walking cast for wound infection.  Today cast was removed by wound care.  Xray reassuring of knee.  There is no palpable effusion or deformity.  Doubt DVT- pain is directly over knee and per patient was the clear result of  a trauma.  Pt placed in knee sleeve, she states she has crutches at home that she can use.  She is also requesting a work note which was provided.  Given information for ortho followup.    I personally performed the services described in this documentation, which was scribed in my presence. The recorded information has been reviewed and is accurate.    Threasa Beards, MD 10/31/13 [phone]

## 2013-10-30 NOTE — ED Notes (Signed)
Fell down step 1.5 weeks ago  w cast on left leg  Cast removed this,  States has been having pain in left knee since fall  Today swollen and warm to touch

## 2013-10-30 NOTE — Discharge Instructions (Signed)
Return to the ED with any concerns including increased pain, redness, increased swelling, discoloration or numbness of toes, or any other alarming symptoms

## 2013-10-30 NOTE — ED Notes (Signed)
Pt reports recently having a cast removed from left leg, removed today - to decrease pressure on a wound on the bottom of her left foot - pt states she fell while wearing the cast - pt experiencing left knee pain and redness today.

## 2013-10-31 IMAGING — CR DG FOOT COMPLETE 3+V*R*
3 series · 3 of 3 positions shown · non-contrast
Comparison: 11/16/2010.

CLINICAL DATA: Nonhealing wound.

RIGHT FOOT COMPLETE - 3+ VIEW

[t foot ap right]
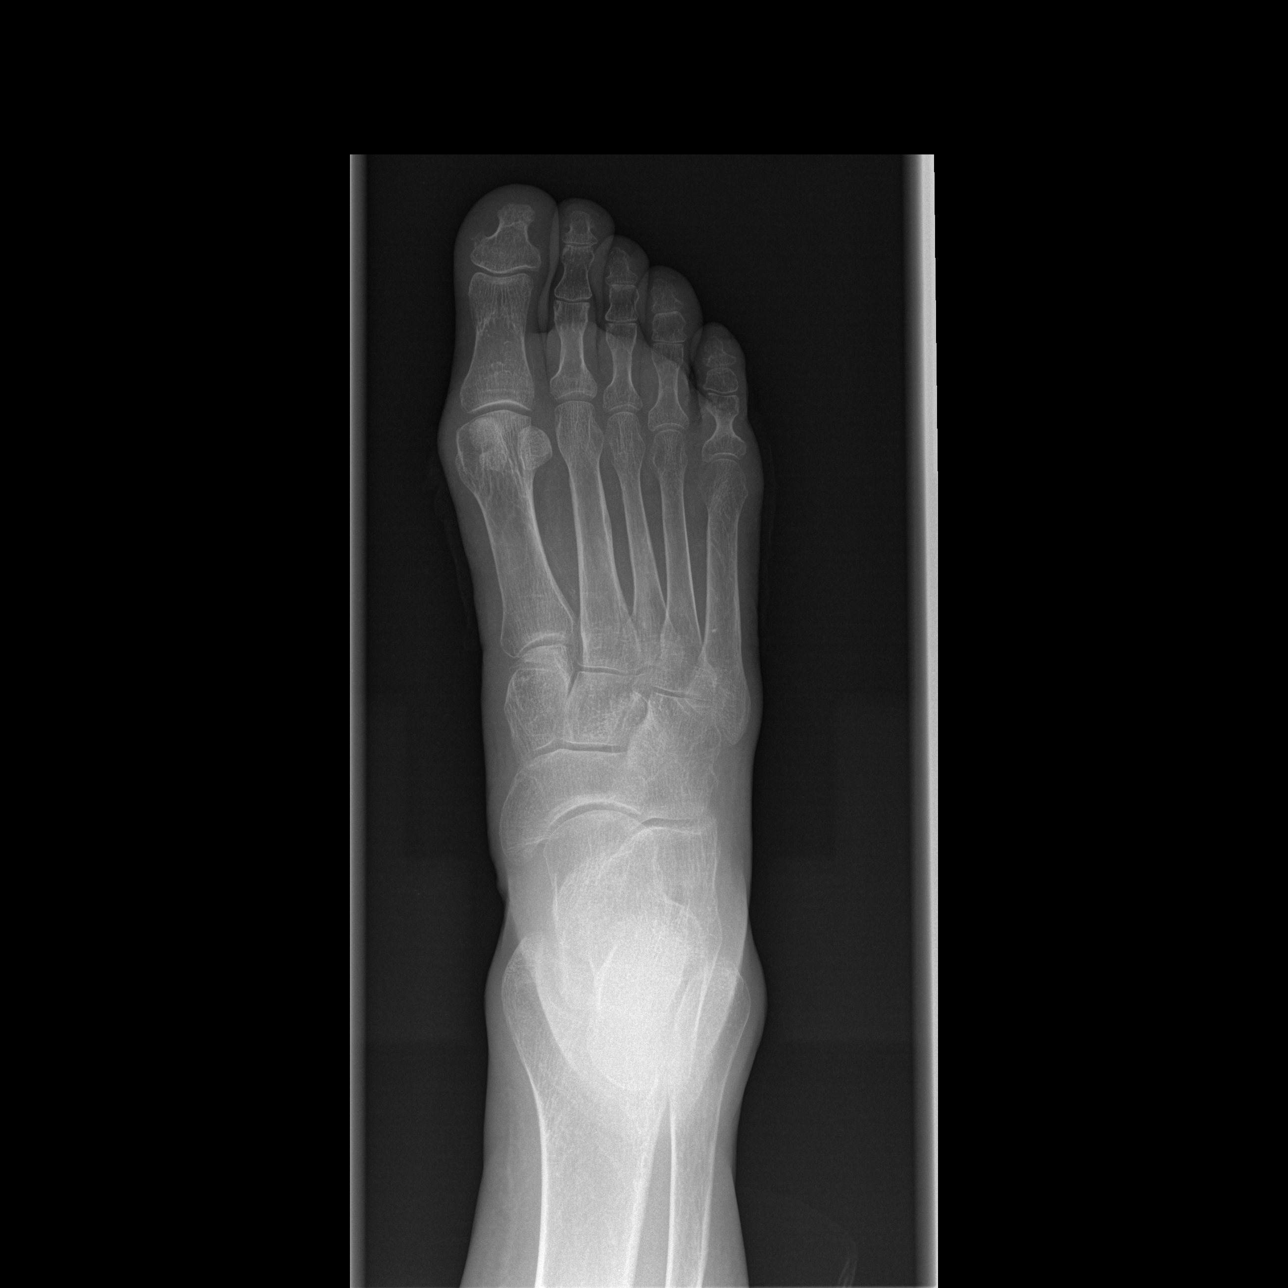

[t foot oblique right]
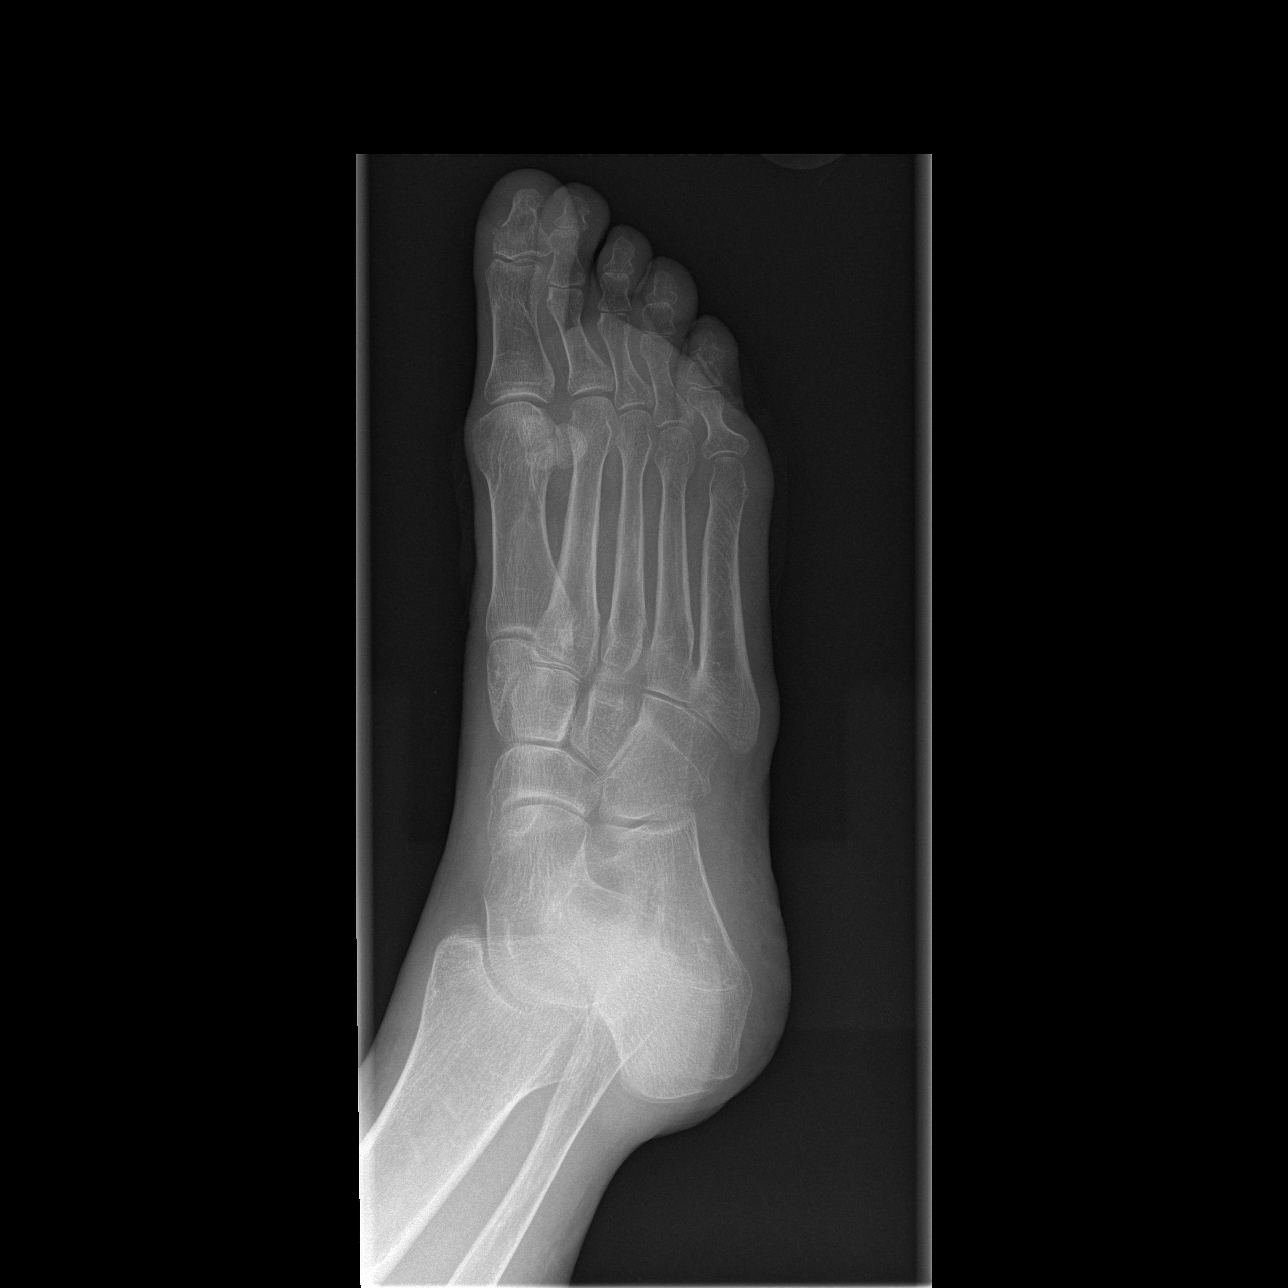

[t foot lat right]
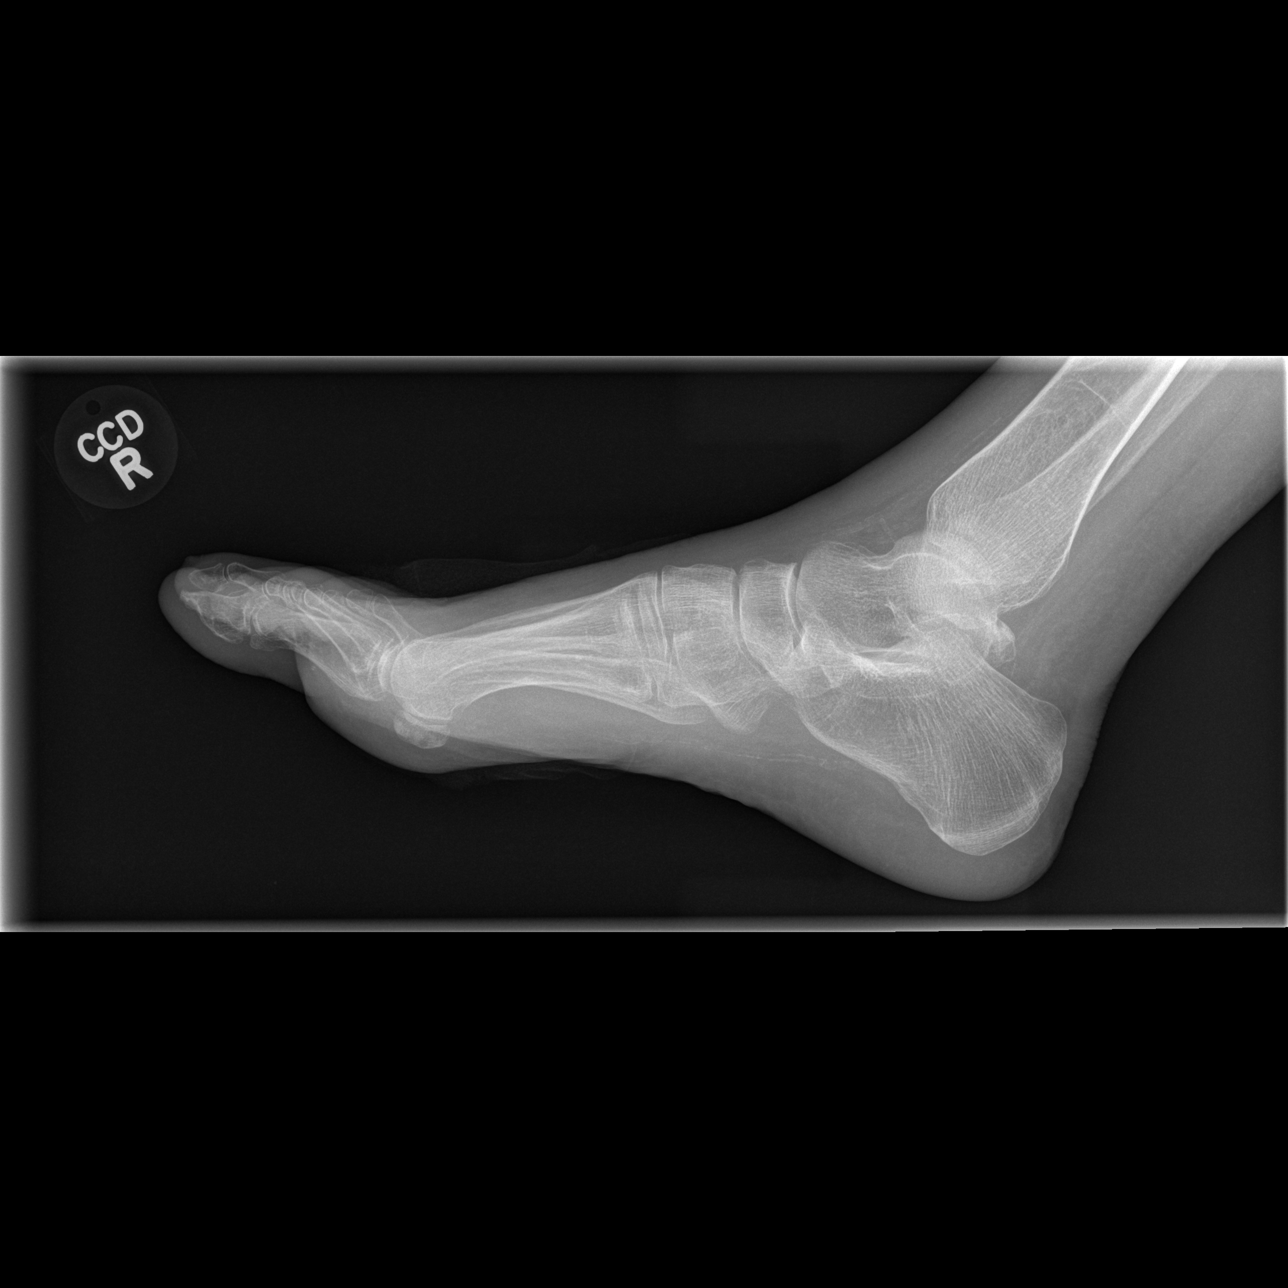

[3 of 3 positions shown; findings below may reference images not displayed]

FINDINGS: Osteopenia. There is soft tissue swelling along the
plantar aspect of the metatarsal phalangeal joints.  No definite
osseous erosion to suggest osteomyelitis.
IMPRESSION: Osteopenia.  No definite osseous erosion to suggest osteomyelitis.

## 2013-11-09 DIAGNOSIS — E1065 Type 1 diabetes mellitus with hyperglycemia: Secondary | ICD-10-CM | POA: Insufficient documentation

## 2013-11-09 DIAGNOSIS — IMO0002 Reserved for concepts with insufficient information to code with codable children: Secondary | ICD-10-CM | POA: Insufficient documentation

## 2013-12-01 ENCOUNTER — Other Ambulatory Visit: Payer: Medicaid Other

## 2013-12-04 ENCOUNTER — Encounter: Payer: Self-pay | Admitting: Endocrinology

## 2013-12-04 ENCOUNTER — Ambulatory Visit (INDEPENDENT_AMBULATORY_CARE_PROVIDER_SITE_OTHER): Payer: Medicaid Other | Admitting: Endocrinology

## 2013-12-04 VITALS — BP 118/74 | HR 96 | Temp 98.8°F | Resp 12 | Wt 127.0 lb

## 2013-12-04 DIAGNOSIS — E1129 Type 2 diabetes mellitus with other diabetic kidney complication: Secondary | ICD-10-CM

## 2013-12-04 DIAGNOSIS — E1049 Type 1 diabetes mellitus with other diabetic neurological complication: Secondary | ICD-10-CM

## 2013-12-04 DIAGNOSIS — N049 Nephrotic syndrome with unspecified morphologic changes: Secondary | ICD-10-CM

## 2013-12-04 DIAGNOSIS — E1121 Type 2 diabetes mellitus with diabetic nephropathy: Secondary | ICD-10-CM

## 2013-12-04 DIAGNOSIS — E1065 Type 1 diabetes mellitus with hyperglycemia: Principal | ICD-10-CM

## 2013-12-04 NOTE — Patient Instructions (Addendum)
Basal at 6 pm should be 0.6 until midnight   Change insulin set every 3 days  Bolus every time you have food intake or sugar and also for all high readings  Do not suspend the pump

## 2013-12-04 NOTE — Progress Notes (Signed)
Patient ID: [name], female   DOB: July 13, 1970, 43 y.o.   MRN: [id number]   Reason for Appointment: Insulin pump follow-up   History of Present Illness   Diagnosis: Type 1 DIABETES MELITUS  Previous history: She has had long-standing poorly controlled diabetes and typically has poor compliance with self care measures despite periodic diabetes education and periodic followup  INSULIN PUMP regimen:  Basal rate: Midnight = 0.325, 9 AM = 0.4, 4 PM = 0.35. Total basal insulin 8.525 units daily  Carb coverage 1: 22 g and correction 1: 90 with target 150, active insulin 4 hours         Recent history:  She has not been seen in followup for over 3 months Her blood sugars are markedly elevated recently and she has various excuses for not getting her blood sugar controlled or following instructions. She thinks she is stressed and not able to motivate herself Her glucose is averaging 431 for the last 2 weeksl A1c has not been done recently, has had some labs with PCP which are not available Current blood sugar patterns:  Fasting glucose has been fairly consistently high with only one good reading of 150 preceded by a bolus at 2 AM  Blood sugars are consistently over 400 at her bedtime which is about midnight-2 AM  She is checking her blood sugars sporadically later in the day and has only about 2 readings in recent download that are below 200  Blood sugars this week are not below 348   Current problems identified:  She is suspending her insulin pump for 3 hours or more when she goes swimming  She is frequently suspending her pump at various times and cannot explain why. She thinks this is for fear of hypoglycemia and has not had any documented low normal blood sugars except once  She is changing her infusion set only when her insulin runs out and this could be every 7-8 days.  She is sometimes suspending her pump during the night for no apparent reason   She continues to have a  poor diet and will drink drinks with sugar and eat sweets all the time  She has hardly any boluses for meals  She does try to bolus for high readings but not consistently  Blood sugars do not usually come back to desired levels after her boluses  Meals: 1 meal per day in the evening usually,  eating at variable times and also having snacks periodically     Physical activity: exercise: Only sporadically           Dietician visit: Most recent:?          Complications: are: Neuropathy, nephropathy, diabetic foot ulcer      Wt Readings from Last 3 Encounters:  12/04/13 127 lb (57.607 kg)  10/30/13 135 lb (61.236 kg)  08/05/13 130 lb (58.968 kg)   Lab Results  Component Value Date   HGBA1C 11.8* 07/31/2013   HGBA1C 11.3* 04/08/2013   HGBA1C 13.5* 03/25/2013   Lab Results  Component Value Date   MICROALBUR 108.7* 04/08/2013   LDLCALC 128* 07/31/2013   CREATININE 1.4* 09/25/2013        Medication List       This list is accurate as of: 12/04/13  4:30 PM.  Always use your most recent med list.               albuterol 108 (90 BASE) MCG/ACT inhaler  Commonly known as:  PROVENTIL HFA;VENTOLIN HFA  Inhale 2 puffs into the lungs every 6 (six) hours as needed. S.O.B.     doxycycline 100 MG tablet  Commonly known as:  VIBRA-TABS  Take 1 tablet (100 mg total) by mouth every 12 (twelve) hours. For 2 weeks     EPINEPHrine 0.3 mg/0.3 mL Soaj injection  Commonly known as:  EPIPEN  Inject 0.3 mLs (0.3 mg total) into the muscle as needed.     ferrous sulfate 325 (65 FE) MG tablet  Take 325 mg by mouth.     gabapentin 100 MG capsule  Commonly known as:  NEURONTIN  Take 100 mg by mouth.     insulin aspart 100 UNIT/ML injection  Commonly known as:  novoLOG  Use max 65 units per day with pump     insulin glargine 100 UNIT/ML injection  Commonly known as:  LANTUS  Inject 10-20 Units into the skin 2 (two) times daily. Take 16 units in am and 12 units at night     insulin lispro 100  UNIT/ML injection  Commonly known as:  HUMALOG  Inject 2-7 Units into the skin 3 (three) times daily before meals. Pt on sliding scale     losartan 25 MG tablet  Commonly known as:  COZAAR  Take 1 tablet (25 mg total) by mouth daily.     oxyCODONE-acetaminophen 5-325 MG per tablet  Commonly known as:  PERCOCET/ROXICET  Take 1-2 tablets by mouth every 6 (six) hours as needed for severe pain.     pravastatin 40 MG tablet  Commonly known as:  PRAVACHOL  Take 0.5 tablets (20 mg total) by mouth daily.     sodium bicarbonate 650 MG tablet  Take 1 tablet (650 mg total) by mouth 3 (three) times daily.     valACYclovir 500 MG tablet  Commonly known as:  VALTREX  Take 500 mg by mouth.        Allergies:  Allergies  Allergen Reactions  . Ciprofloxacin Itching  . Cleocin [Clindamycin Hcl]     Diarrhea     Past Medical History  Diagnosis Date  . Diabetes mellitus   . Neuropathy in diabetes   . Hypertension   . Hypercholesteremia   . H/O seasonal allergies   . Anemia   . Left foot infection     Past Surgical History  Procedure Laterality Date  . Cesarean section    . Eye surgery    . Hemrrhoidectomy    . Peripherally inserted central catheter insertion      No family history on file.  Social History:  reports that she quit smoking about 5 years ago. She has never used smokeless tobacco. She reports that she drinks about .5 ounces of alcohol per week. She reports that she does not use illicit drugs.  Review of Systems:  Hypertension:  Her lisinopril had been stopped because of hyperkalemia and higher creatinine levels.  She  is taking losartan and now is followed by nephrologist  Lipids: Has been  prescribed pravastatin  Lab Results  Component Value Date   CHOL 206* 07/31/2013   HDL 38.40* 07/31/2013   LDLCALC 128* 07/31/2013   LDLDIRECT 112.2 05/25/2013   TRIG 199.0* 07/31/2013   CHOLHDL 5 07/31/2013        Examination:   BP 118/74  Pulse 96  Temp(Src) 98.8 F  (37.1 C) (Oral)  Resp 12  Wt 127 lb (57.607 kg)  SpO2 97%  Body mass index is 20.19 kg/(m^2).    ASSESSMENT/ PLAN::  Diabetes type 1 with poor control and multiple complications    Blood glucose levels  are quite poorly controlled  Her compliance is worse and she is not very motivated to do any better Discussed that she does need a higher basal rate since blood sugars are consistently high She also needs to bolus for all carbohydrate intake which she is not See history of present illness for details on current management and problems identified Advised her that she will continue to get worsening of her diabetes complications without adequate control Also discussed that having an insulin pump is not beneficial if she does not use it properly  PLAN:  Basal rate 0.6 between 6 PM and midnight Follow directions for proper pump usage as discussed in detail today More frequent glucose monitoring Improve diet Followup in 6 weeks  Patient instructions: Basal at 6 pm should be 0.6 until midnight  Change insulin set every 3 days Bolus every time you have food intake or sugar and also for all high readings Do not suspend the pump.  Counseling time over 50% of today's 25 minute visit  [name] 12/04/2013, 4:30 PM

## 2013-12-09 ENCOUNTER — Other Ambulatory Visit: Payer: Self-pay | Admitting: Nephrology

## 2013-12-09 DIAGNOSIS — N183 Chronic kidney disease, stage 3 unspecified: Secondary | ICD-10-CM

## 2013-12-10 ENCOUNTER — Other Ambulatory Visit (HOSPITAL_COMMUNITY): Payer: Self-pay | Admitting: *Deleted

## 2013-12-11 ENCOUNTER — Encounter (HOSPITAL_COMMUNITY): Payer: Medicaid Other

## 2013-12-25 ENCOUNTER — Other Ambulatory Visit: Payer: Medicaid Other

## 2014-01-01 ENCOUNTER — Other Ambulatory Visit: Payer: Self-pay | Admitting: *Deleted

## 2014-01-01 ENCOUNTER — Other Ambulatory Visit: Payer: Medicaid Other

## 2014-01-01 DIAGNOSIS — IMO0002 Reserved for concepts with insufficient information to code with codable children: Secondary | ICD-10-CM

## 2014-01-01 DIAGNOSIS — E1049 Type 1 diabetes mellitus with other diabetic neurological complication: Secondary | ICD-10-CM

## 2014-01-01 DIAGNOSIS — E1065 Type 1 diabetes mellitus with hyperglycemia: Principal | ICD-10-CM

## 2014-01-06 ENCOUNTER — Ambulatory Visit
Admission: RE | Admit: 2014-01-06 | Discharge: 2014-01-06 | Disposition: A | Discharge status: Home / Self Care | Payer: Medicaid Other | Source: Ambulatory Visit | Attending: Nephrology | Admitting: Nephrology

## 2014-01-06 ENCOUNTER — Ambulatory Visit (INDEPENDENT_AMBULATORY_CARE_PROVIDER_SITE_OTHER): Payer: Medicaid Other | Admitting: Endocrinology

## 2014-01-06 ENCOUNTER — Encounter: Payer: Self-pay | Admitting: Endocrinology

## 2014-01-06 VITALS — BP 138/84 | HR 93 | Temp 97.7°F | Resp 16 | Ht 66.5 in | Wt 128.0 lb

## 2014-01-06 DIAGNOSIS — E1041 Type 1 diabetes mellitus with diabetic mononeuropathy: Secondary | ICD-10-CM

## 2014-01-06 DIAGNOSIS — N183 Chronic kidney disease, stage 3 unspecified: Secondary | ICD-10-CM

## 2014-01-06 DIAGNOSIS — E1049 Type 1 diabetes mellitus with other diabetic neurological complication: Secondary | ICD-10-CM

## 2014-01-06 DIAGNOSIS — E7800 Pure hypercholesterolemia, unspecified: Secondary | ICD-10-CM

## 2014-01-06 DIAGNOSIS — E875 Hyperkalemia: Secondary | ICD-10-CM

## 2014-01-06 DIAGNOSIS — E1065 Type 1 diabetes mellitus with hyperglycemia: Principal | ICD-10-CM

## 2014-01-06 DIAGNOSIS — N182 Chronic kidney disease, stage 2 (mild): Secondary | ICD-10-CM

## 2014-01-06 DIAGNOSIS — IMO0002 Reserved for concepts with insufficient information to code with codable children: Secondary | ICD-10-CM

## 2014-01-06 LAB — COMPREHENSIVE METABOLIC PANEL
ALBUMIN: 2.5 g/dL — AB (ref 3.5–5.2)
ALK PHOS: 123 U/L — AB (ref 39–117)
ALT: 25 U/L (ref 0–35)
AST: 21 U/L (ref 0–37)
BILIRUBIN TOTAL: 0.3 mg/dL (ref 0.2–1.2)
BUN: 33 mg/dL — ABNORMAL HIGH (ref 6–23)
CO2: 15 mEq/L — ABNORMAL LOW (ref 19–32)
Calcium: 8.1 mg/dL — ABNORMAL LOW (ref 8.4–10.5)
Chloride: 112 mEq/L (ref 96–112)
Creatinine, Ser: 1.4 mg/dL — ABNORMAL HIGH (ref 0.4–1.2)
GFR: 42.74 mL/min — ABNORMAL LOW (ref >60.00)
GLUCOSE: 323 mg/dL — AB (ref 70–99)
POTASSIUM: 5.4 meq/L — AB (ref 3.5–5.1)
SODIUM: 134 meq/L — AB (ref 135–145)
TOTAL PROTEIN: 6.7 g/dL (ref 6.0–8.3)

## 2014-01-06 LAB — LIPID PANEL
CHOL/HDL RATIO: 7
CHOLESTEROL: 158 mg/dL (ref 0–200)
HDL: 24 mg/dL — ABNORMAL LOW (ref >39.00)
NonHDL: 134
Triglycerides: 208 mg/dL — ABNORMAL HIGH (ref 0.0–149.0)
VLDL: 41.6 mg/dL — ABNORMAL HIGH (ref 0.0–40.0)

## 2014-01-06 LAB — HEMOGLOBIN A1C: HEMOGLOBIN A1C: 12.4 % — AB (ref 4.6–6.5)

## 2014-01-06 IMAGING — CR DG FOOT COMPLETE 3+V*R*
3 series · 3 of 3 positions shown · non-contrast
Comparison: 06/05/2012

CLINICAL DATA: Foot pain

RIGHT FOOT COMPLETE - 3+ VIEW

[t foot ap right]
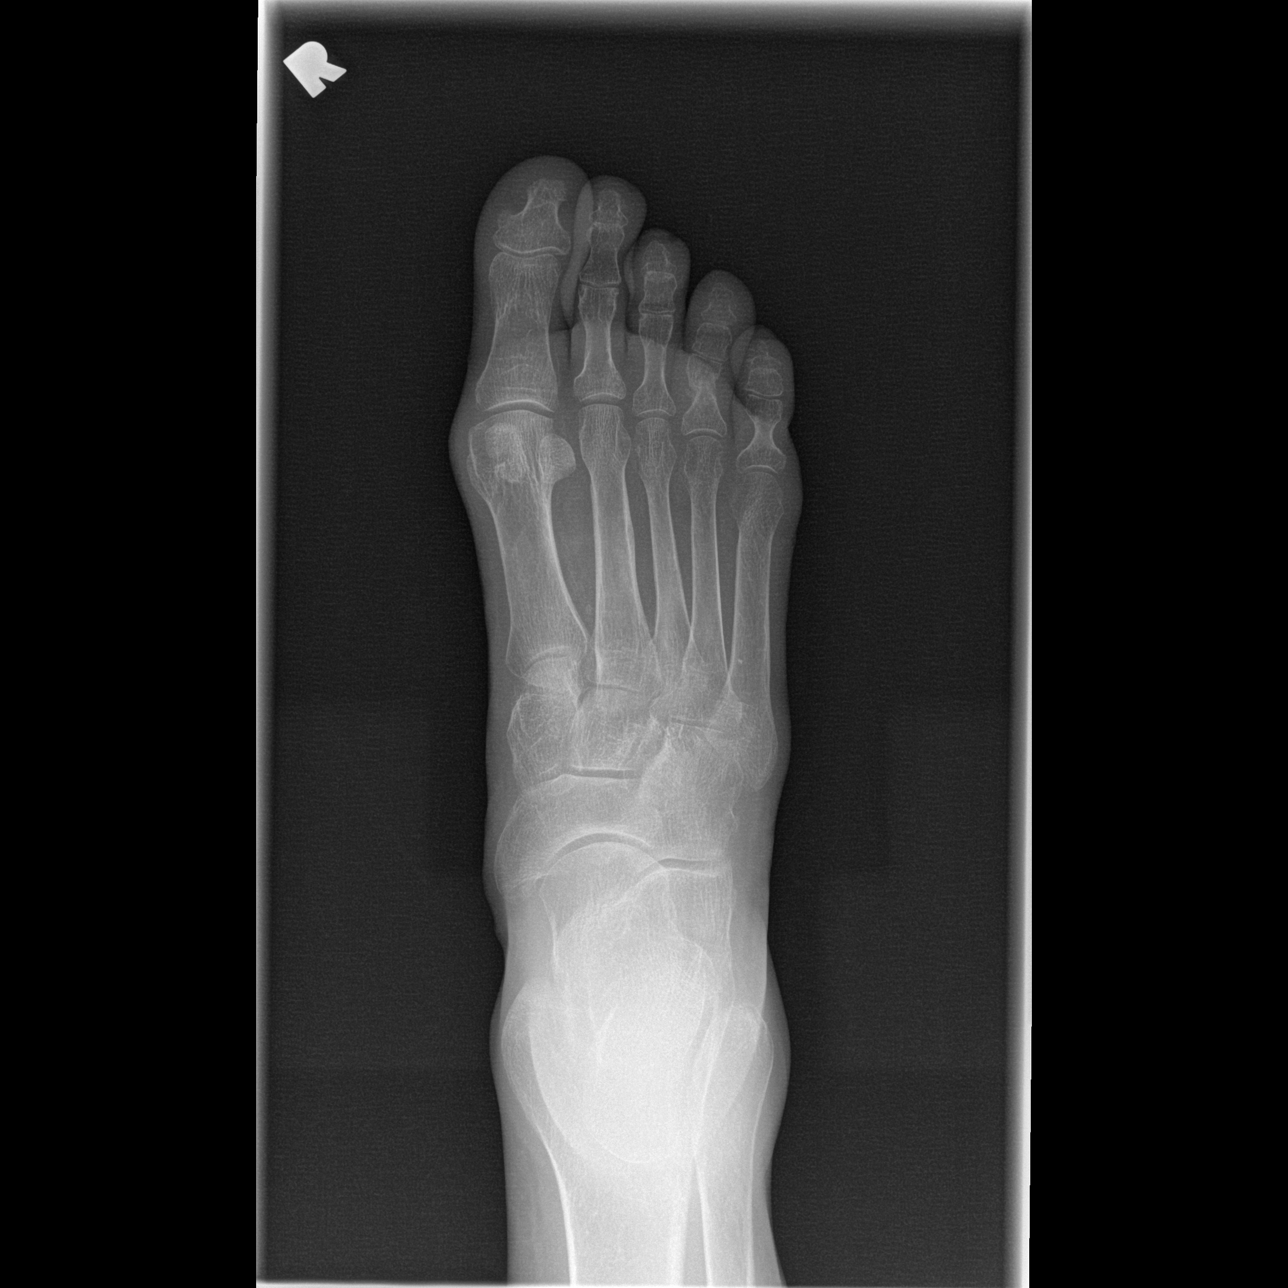

[t foot oblique right]
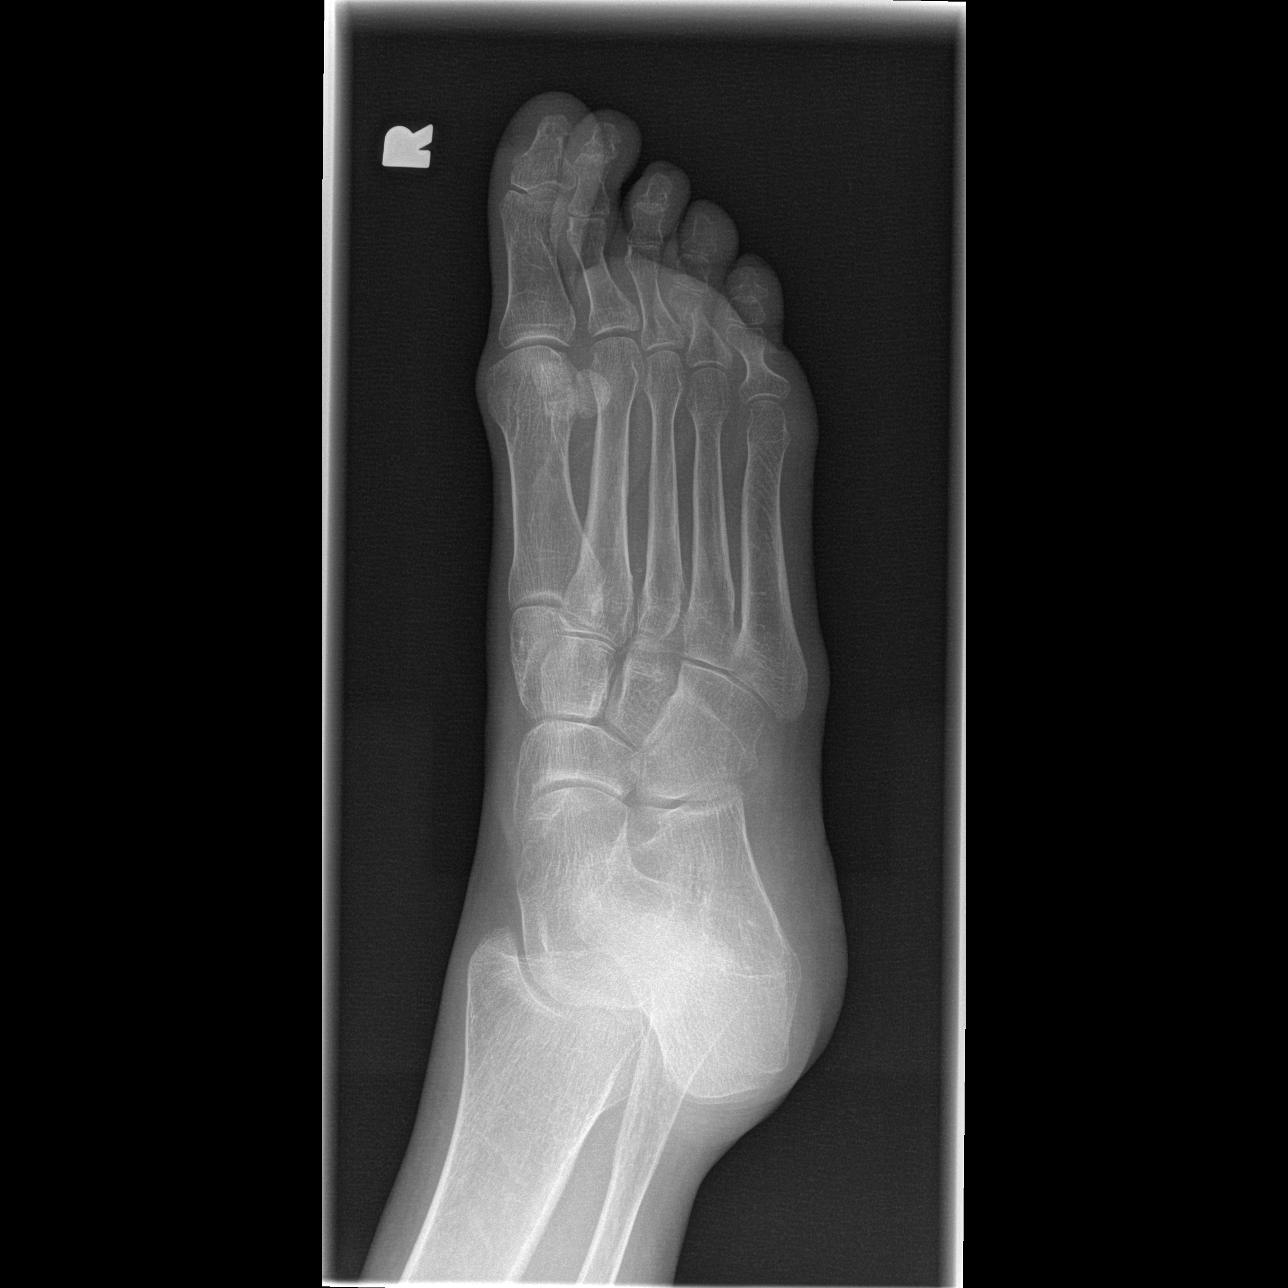

[t foot lat right]
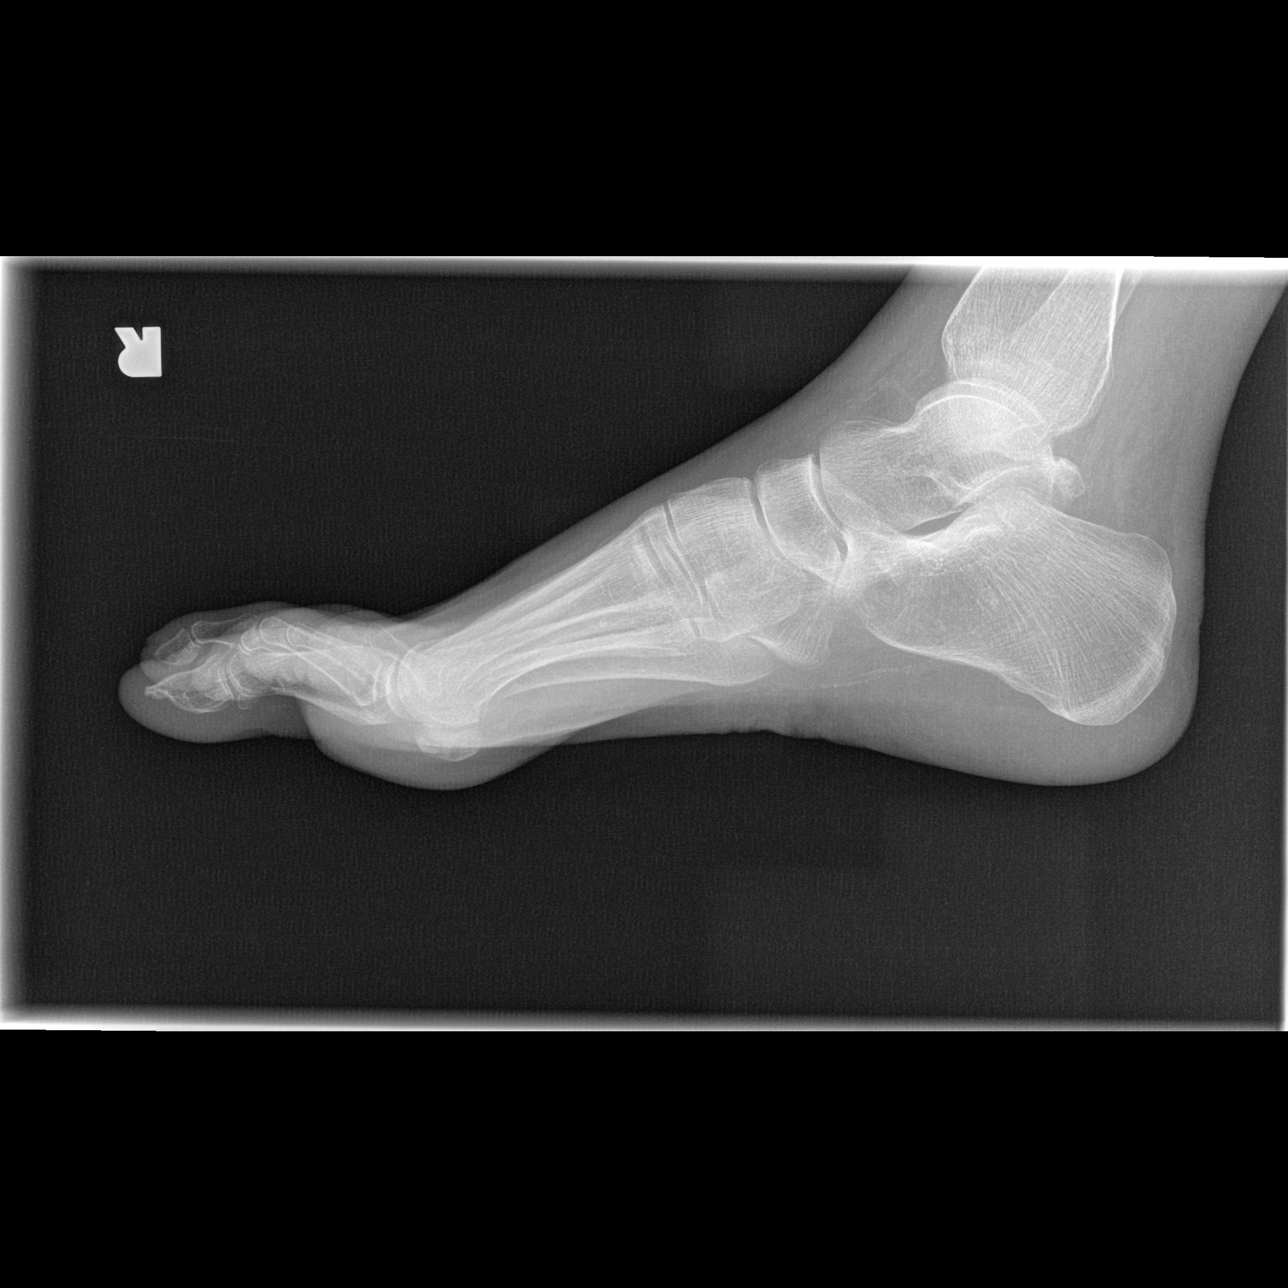

[3 of 3 positions shown; findings below may reference images not displayed]

FINDINGS: Three views of the right foot submitted.  There is
healing fracture proximal phalanx fourth toe.  Diffuse osteopenia
again noted.
IMPRESSION: Diffuse osteopenia again noted.  Healing fracture proximal phalanx
fourth toe of indeterminate age.

## 2014-01-06 NOTE — Patient Instructions (Addendum)
0.6 basal at 5 pm and continue other settings  MUST not suspend the insulin pump at any time unless blood sugar below 55  Check blood sugars 4 times a day consistently and take bolus insulin for all high readings Check blood sugars every 2 hours if having an acute illness and if the blood sugar has been over 400 previously  May try to bolus right after eating to help figure out the correct amount of carbohydrates Also tried to check blood sugars about 2 hours after a meal to help identify the proper amount of bolus needed for carbohydrates  Avoid drinks with sugar and if you are drinking sugared drinks need to cover the carbohydrates with boluses

## 2014-01-06 NOTE — Progress Notes (Signed)
Patient ID: [name], female   DOB: [birth year], 43 y.o.   MRN: [id number]   Reason for Appointment: Insulin pump follow-up   History of Present Illness   Diagnosis: Type 1 DIABETES MELITUS  Previous history: She has had long-standing poorly controlled diabetes and typically has poor compliance with self care measures despite periodic diabetes education and periodic followup  INSULIN PUMP regimen:  Basal rate: Midnight = 0.325, 9 AM = 0.4, 4 PM = 0.35. Total basal insulin 8.525 units daily  Carb coverage 1: 22 g and correction 1: 90 with target 150, active insulin 4 hours         Recent history:  Her blood sugars are still mostly elevated recently and she has difficulty with compliance in following instructions for proper self-care She does have significant fear of hypoglycemia although she has not had any recently Her glucose is averaging 310 for the last 2 weeks which is at least better than her last visit A1c has not been done recently  PREMEAL  overnight   8-10 AM  11 AM-2PM  5 PM-8PM   11 PM-1AM   Glucose range:  190-400+  135-400+   80-400+   135-400+   209-400+   Mean/median:        Current blood sugar patterns:  Fasting glucose has been mostly very high with only 2 readings that are below 150  Blood sugars are not checked midday and afternoon very much and has only one good reading of 80 recently  She appears to have higher readings after mealtime boluses whenever she checks them  She has only one good reading around 6 PM, preceded by a correction bolus  Blood sugars are consistently high at her bedtime which is usually late at night  She is checking her blood sugars on average 2.6 times a day but not consistently throughout the day   Current problems identified:  She did not increase her basal rate in the evening as directed on her last visit  Currently still drinking large amounts of sugar drinks without bolusing, may drink a half a gallon of apple cider at  time  Has disconnected her pump when she was having rest arthritis causing the blood sugar to be high  Most of her boluses are with correction, about two thirds  She is suspending her insulin pump for 3 hours or more when she goes swimming  She is frequently suspending her pump at various times and cannot explain why. She thinks this is for fear of hypoglycemia and has not had any documented low normal blood sugars except once  She is changing her infusion set every 5-7 days only  Blood sugars do not usually come back to desired levels after her boluses  She thinks she is not bolusing enough since he does not always know how much he will eat, occasionally will forget the bolus before eating  Meals: 1 meal per day in the evening usually,  eating at variable times and also having snacks periodically     Physical activity: exercise: Only sporadically           Dietician visit: Most recent:?          Complications: are: Neuropathy, nephropathy, diabetic foot ulcer      Wt Readings from Last 3 Encounters:  01/06/14 128 lb (58.06 kg)  12/04/13 127 lb (57.607 kg)  10/30/13 135 lb (61.236 kg)   Lab Results  Component Value Date   HGBA1C 11.8* 07/31/2013  HGBA1C 11.3* 04/08/2013   HGBA1C 13.5* 03/25/2013   Lab Results  Component Value Date   MICROALBUR 108.7* 04/08/2013   LDLCALC 128* 07/31/2013   CREATININE 1.4* 09/25/2013        Medication List       This list is accurate as of: 01/06/14  2:04 PM.  Always use your most recent med list.               albuterol 108 (90 BASE) MCG/ACT inhaler  Commonly known as:  PROVENTIL HFA;VENTOLIN HFA  Inhale 2 puffs into the lungs every 6 (six) hours as needed. S.O.B.     calcitRIOL 0.25 MCG capsule  Commonly known as:  ROCALTROL  Take 0.25 mcg by mouth.     ferrous sulfate 325 (65 FE) MG tablet  Take 325 mg by mouth.     insulin aspart 100 UNIT/ML injection  Commonly known as:  novoLOG  Use max 65 units per day with pump      insulin lispro 100 UNIT/ML injection  Commonly known as:  HUMALOG  Inject 2-7 Units into the skin 3 (three) times daily before meals. Pt on sliding scale     lisinopril 10 MG tablet  Commonly known as:  PRINIVIL,ZESTRIL  Take 10 mg by mouth.     losartan 25 MG tablet  Commonly known as:  COZAAR  Take 1 tablet (25 mg total) by mouth daily.     mupirocin ointment 2 %  Commonly known as:  BACTROBAN     oxyCODONE-acetaminophen 5-325 MG per tablet  Commonly known as:  PERCOCET/ROXICET  Take 1-2 tablets by mouth every 6 (six) hours as needed for severe pain.     pravastatin 40 MG tablet  Commonly known as:  PRAVACHOL  Take 0.5 tablets (20 mg total) by mouth daily.     sodium bicarbonate 650 MG tablet  Take 1 tablet (650 mg total) by mouth 3 (three) times daily.     valACYclovir 500 MG tablet  Commonly known as:  VALTREX  Take 500 mg by mouth.        Allergies:  Allergies  Allergen Reactions  . Ciprofloxacin Itching  . Cleocin [Clindamycin Hcl]     Diarrhea     Past Medical History  Diagnosis Date  . Diabetes mellitus   . Neuropathy in diabetes   . Hypertension   . Hypercholesteremia   . H/O seasonal allergies   . Anemia   . Left foot infection     Past Surgical History  Procedure Laterality Date  . Cesarean section    . Eye surgery    . Hemrrhoidectomy    . Peripherally inserted central catheter insertion      No family history on file.  Social History:  reports that she quit smoking about 5 years ago. She has never used smokeless tobacco. She reports that she drinks about .5 ounces of alcohol per week. She reports that she does not use illicit drugs.  Review of Systems:  Hypertension:  Her lisinopril had been stopped because of hyperkalemia and higher creatinine levels.  She  is taking losartan and now is followed by nephrologist  Lipids: Has been  prescribed pravastatin  Lab Results  Component Value Date   CHOL 206* 07/31/2013   HDL 38.40* 07/31/2013    LDLCALC 128* 07/31/2013   LDLDIRECT 112.2 05/25/2013   TRIG 199.0* 07/31/2013   CHOLHDL 5 07/31/2013        Examination:   BP 138/84  Pulse  93  Temp(Src) 97.7 F (36.5 C)  Resp 16  Ht 5' 6.5" (1.689 m)  Wt 128 lb (58.06 kg)  BMI 20.35 kg/m2  SpO2 96%  Body mass index is 20.35 kg/(m^2).    ASSESSMENT/ PLAN:   Diabetes type 1 with poor control and multiple complications    Blood glucose levels  are still poorly controlled  Her compliance is still suboptimal although she is trying to check blood sugars more often and trying to bolus more consistently for high readings Discussed that she does need a higher basal rate in the evenings since blood sugars are consistently high Also probably needs higher mealtime coverage but she is reluctant to increase her doses She also needs to bolus for all carbohydrate intake including sugar drinks which she is not doing See history of present illness for details on current management and problems identified She was seen by the nurse educator today for day-to-day management as well as instruction on how to program her pump  PLAN:  Basal rate 0.6 between 5 PM and midnight She will not disconnect her pump More frequent glucose monitoring She needs to bolus appropriately and discussed timing of boluses and adjustment based on carbohydrate intake Improve diet Followup in 6 weeks  Patient Instructions  0.6 basal at 5 pm and continue other settings  MUST not suspend the insulin pump at any time unless blood sugar below 55  Check blood sugars 4 times a day consistently and take bolus insulin for all high readings Check blood sugars every 2 hours if having an acute illness and if the blood sugar has been over 400 previously  May try to bolus right after eating to help figure out the correct amount of carbohydrates Also tried to check blood sugars about 2 hours after a meal to help identify the proper amount of bolus needed for carbohydrates  Avoid  drinks with sugar and if you are drinking sugared drinks need to cover the carbohydrates with boluses   Counseling time over 50% of today's 25 minute visit  Dnaiel Voller 01/06/2014, 2:04 PM   Addendum: Potassium 5.4, need to stop lisinopril and go back to 25 mg losartan

## 2014-01-07 ENCOUNTER — Other Ambulatory Visit: Payer: Self-pay | Admitting: *Deleted

## 2014-01-07 LAB — LDL CHOLESTEROL, DIRECT: Direct LDL: 86.2 mg/dL

## 2014-01-07 MED ORDER — LOSARTAN POTASSIUM 25 MG PO TABS: 25.0000 mg | ORAL_TABLET | Freq: Every day | ORAL | Status: DC

## 2014-01-07 NOTE — Progress Notes (Signed)
Quick Note:  A1c very high at 12.4; Potassium high at 5.4, need to stop lisinopril and go back to 25 mg losartan  ______

## 2014-01-09 IMAGING — CR DG FOOT COMPLETE 3+V*L*
3 series · 3 of 3 positions shown · non-contrast
Comparison: 02/02/2012

CLINICAL DATA: Left foot pain for 2 days, pain at fourth and fifth
metatarsal head region

LEFT FOOT - COMPLETE 3+ VIEW

[t foot ap left]
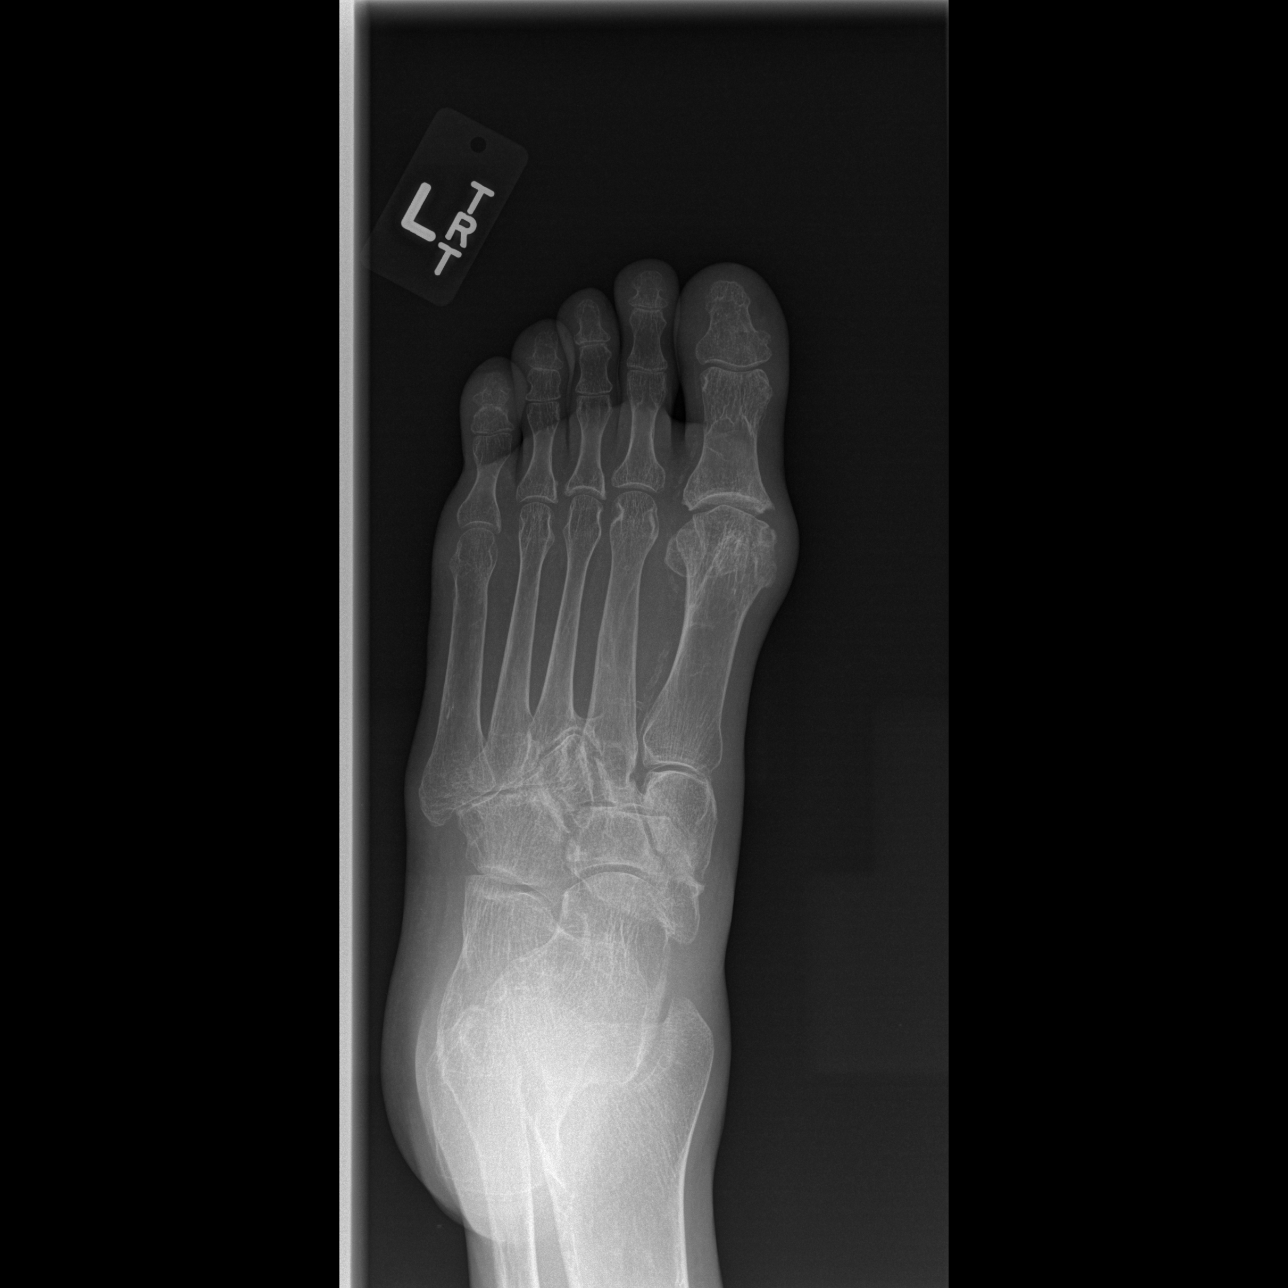

[t foot oblique left]
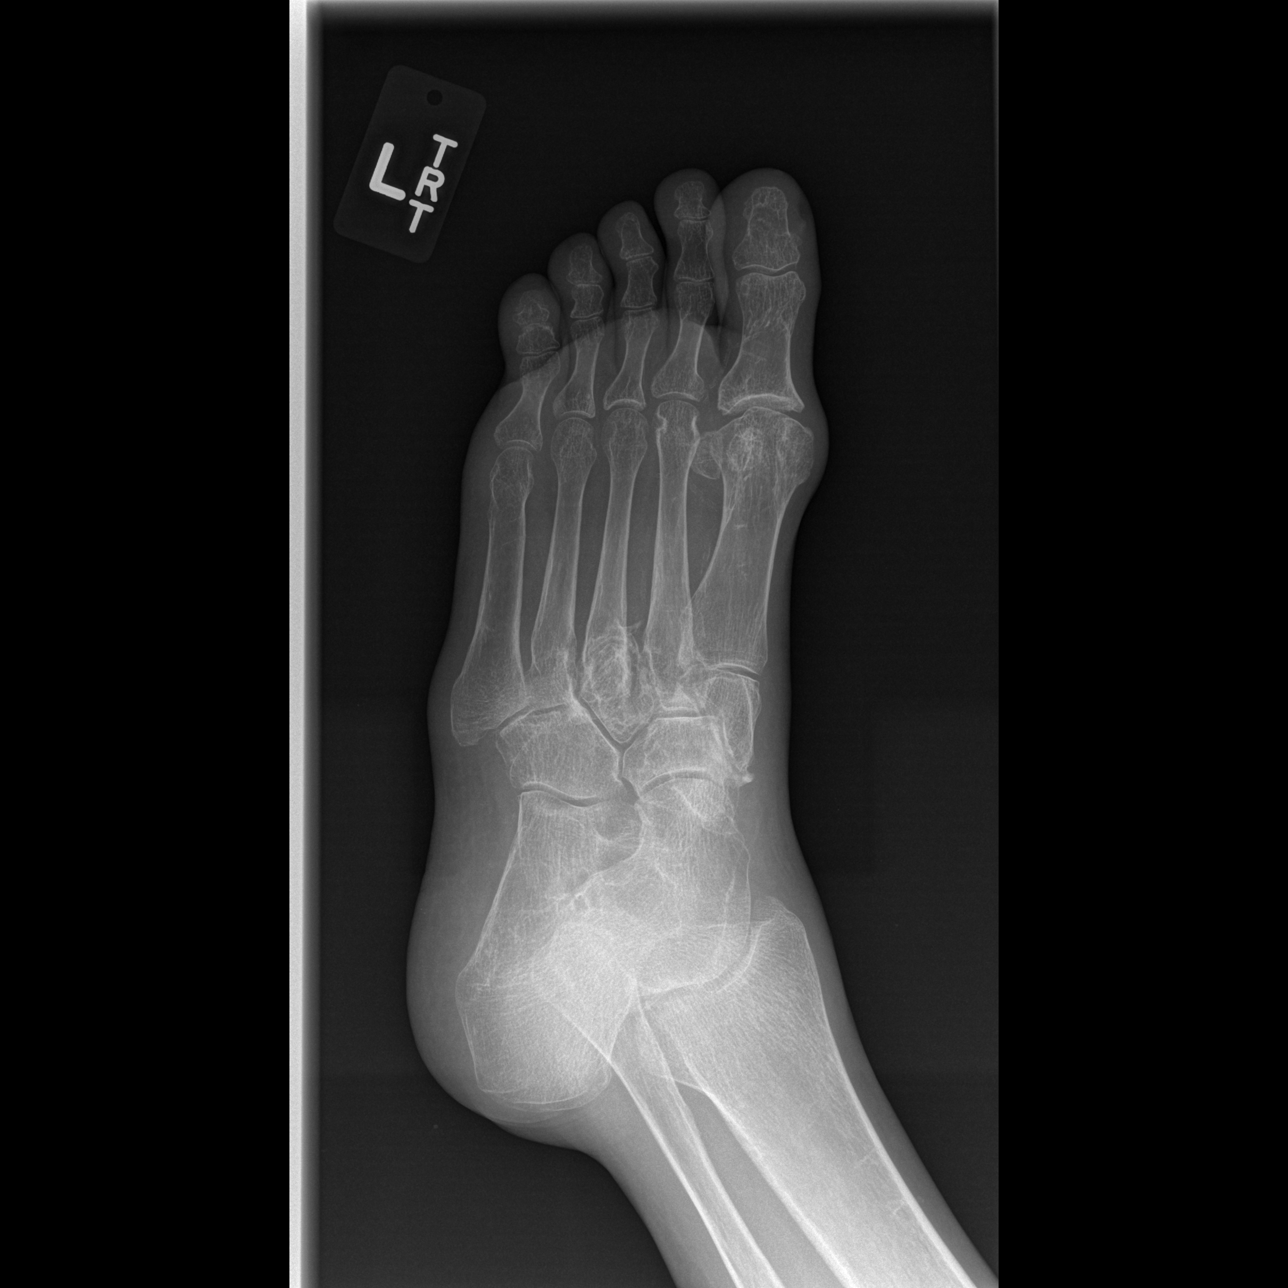

[t foot lat left]
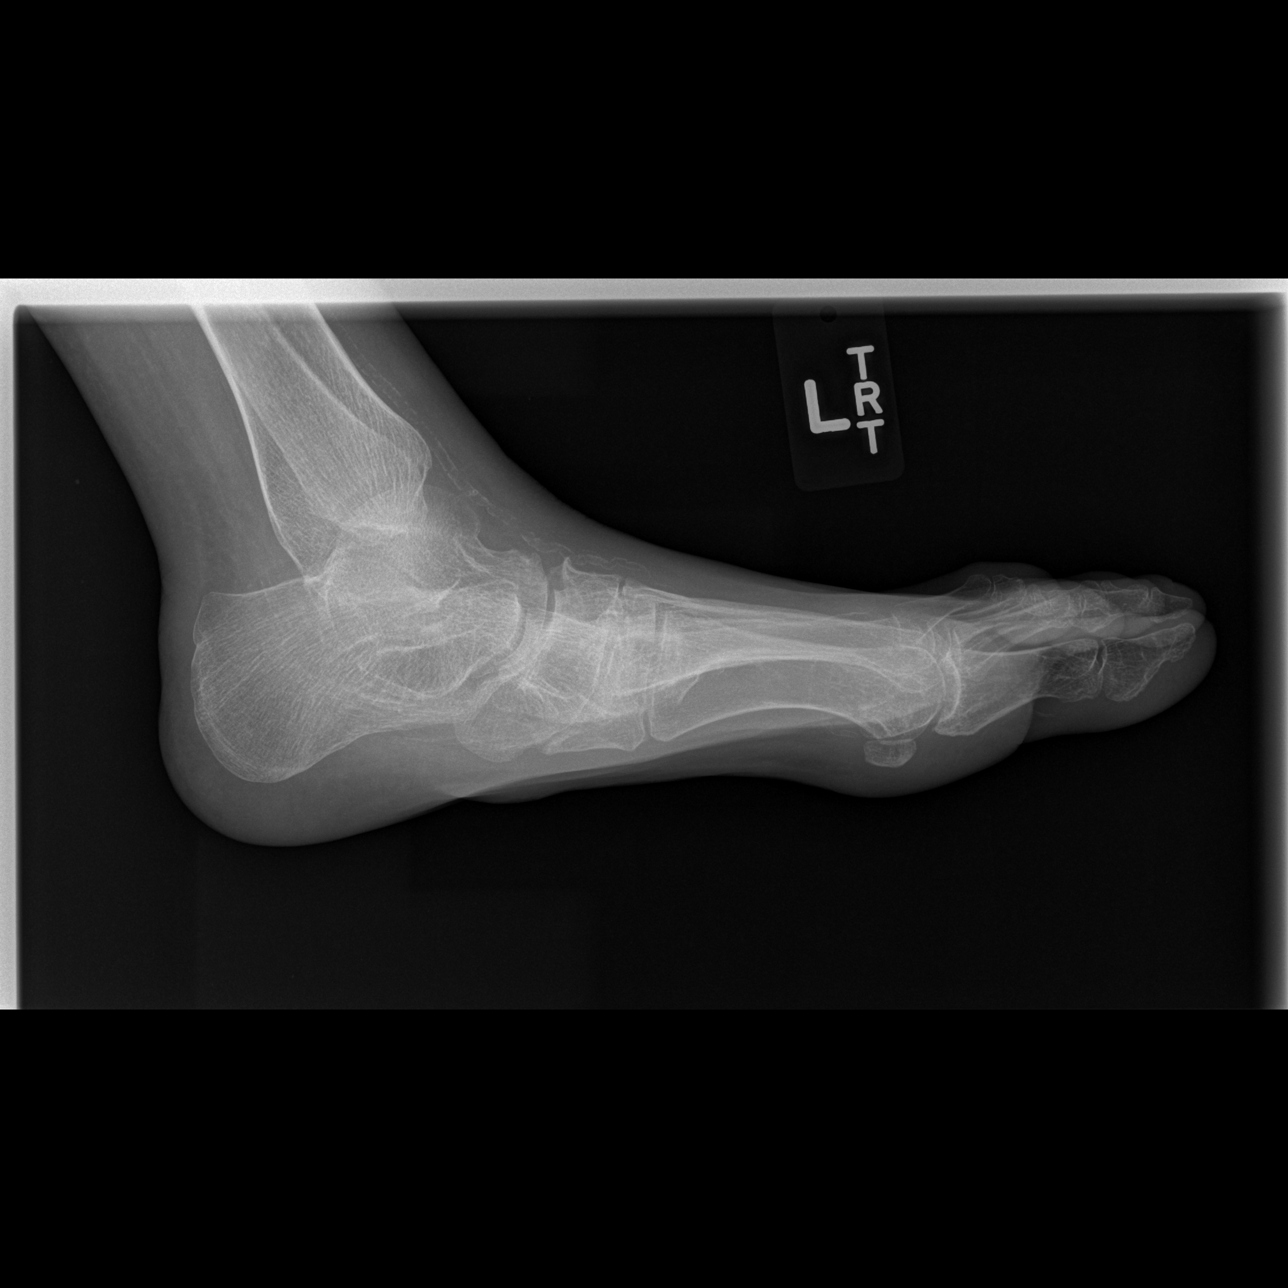

[3 of 3 positions shown; findings below may reference images not displayed]

FINDINGS: Marked osseous demineralization.
Degenerative changes first MTP joint.
Remaining joint spaces fairly well preserved.
Scattered intertarsal degenerative changes.
No acute fracture, dislocation, or bone destruction.
IMPRESSION: Severe osseous demineralization with mild scattered degenerative
changes.
No acute abnormalities.

## 2014-01-26 ENCOUNTER — Other Ambulatory Visit: Payer: Self-pay | Admitting: Nephrology

## 2014-01-26 DIAGNOSIS — R229 Localized swelling, mass and lump, unspecified: Principal | ICD-10-CM

## 2014-01-26 DIAGNOSIS — IMO0002 Reserved for concepts with insufficient information to code with codable children: Secondary | ICD-10-CM

## 2014-02-01 ENCOUNTER — Ambulatory Visit
Admission: RE | Admit: 2014-02-01 | Discharge: 2014-02-01 | Disposition: A | Discharge status: Home / Self Care | Payer: Medicaid Other | Source: Ambulatory Visit | Attending: Nephrology | Admitting: Nephrology

## 2014-02-01 DIAGNOSIS — IMO0002 Reserved for concepts with insufficient information to code with codable children: Secondary | ICD-10-CM

## 2014-02-01 DIAGNOSIS — R229 Localized swelling, mass and lump, unspecified: Principal | ICD-10-CM

## 2014-03-19 ENCOUNTER — Emergency Department (HOSPITAL_BASED_OUTPATIENT_CLINIC_OR_DEPARTMENT_OTHER)
Admission: EM | Admit: 2014-03-19 | Discharge: 2014-03-20 | Disposition: A | Discharge status: Home / Self Care | Payer: Medicaid Other | Attending: Emergency Medicine | Admitting: Emergency Medicine

## 2014-03-19 ENCOUNTER — Emergency Department (HOSPITAL_BASED_OUTPATIENT_CLINIC_OR_DEPARTMENT_OTHER): Payer: Medicaid Other

## 2014-03-19 ENCOUNTER — Encounter (HOSPITAL_BASED_OUTPATIENT_CLINIC_OR_DEPARTMENT_OTHER): Payer: Self-pay | Admitting: *Deleted

## 2014-03-19 DIAGNOSIS — M79672 Pain in left foot: Secondary | ICD-10-CM | POA: Diagnosis present

## 2014-03-19 DIAGNOSIS — R52 Pain, unspecified: Secondary | ICD-10-CM

## 2014-03-19 DIAGNOSIS — Z794 Long term (current) use of insulin: Secondary | ICD-10-CM | POA: Insufficient documentation

## 2014-03-19 DIAGNOSIS — E1040 Type 1 diabetes mellitus with diabetic neuropathy, unspecified: Secondary | ICD-10-CM | POA: Insufficient documentation

## 2014-03-19 DIAGNOSIS — E1065 Type 1 diabetes mellitus with hyperglycemia: Secondary | ICD-10-CM | POA: Insufficient documentation

## 2014-03-19 DIAGNOSIS — Z79899 Other long term (current) drug therapy: Secondary | ICD-10-CM | POA: Diagnosis not present

## 2014-03-19 DIAGNOSIS — E780 Pure hypercholesterolemia: Secondary | ICD-10-CM | POA: Insufficient documentation

## 2014-03-19 DIAGNOSIS — T1490XA Injury, unspecified, initial encounter: Secondary | ICD-10-CM

## 2014-03-19 DIAGNOSIS — Z87891 Personal history of nicotine dependence: Secondary | ICD-10-CM | POA: Insufficient documentation

## 2014-03-19 DIAGNOSIS — E10621 Type 1 diabetes mellitus with foot ulcer: Secondary | ICD-10-CM | POA: Diagnosis not present

## 2014-03-19 DIAGNOSIS — L97529 Non-pressure chronic ulcer of other part of left foot with unspecified severity: Secondary | ICD-10-CM | POA: Diagnosis not present

## 2014-03-19 DIAGNOSIS — D649 Anemia, unspecified: Secondary | ICD-10-CM | POA: Insufficient documentation

## 2014-03-19 DIAGNOSIS — R739 Hyperglycemia, unspecified: Secondary | ICD-10-CM

## 2014-03-19 LAB — BASIC METABOLIC PANEL
ANION GAP: 16 — AB (ref 5–15)
Anion gap: 16 — ABNORMAL HIGH (ref 5–15)
BUN: 38 mg/dL — ABNORMAL HIGH (ref 6–23)
BUN: 36 mg/dL — AB (ref 6–23)
CHLORIDE: 94 meq/L — AB (ref 96–112)
CO2: 17 meq/L — AB (ref 19–32)
CO2: 16 mEq/L — ABNORMAL LOW (ref 19–32)
CREATININE: 1.5 mg/dL — AB (ref 0.50–1.10)
Calcium: 8.6 mg/dL (ref 8.4–10.5)
Calcium: 8.4 mg/dL (ref 8.4–10.5)
Chloride: 102 mEq/L (ref 96–112)
Creatinine, Ser: 1.5 mg/dL — ABNORMAL HIGH (ref 0.50–1.10)
GFR calc non Af Amer: 42 mL/min — ABNORMAL LOW (ref >90)
GFR, EST AFRICAN AMERICAN: 49 mL/min — AB (ref >90)
GFR, EST AFRICAN AMERICAN: 49 mL/min — AB (ref >90)
GFR, EST NON AFRICAN AMERICAN: 42 mL/min — AB (ref >90)
Glucose, Bld: 737 mg/dL (ref 70–99)
Glucose, Bld: 350 mg/dL — ABNORMAL HIGH (ref 70–99)
POTASSIUM: 4.7 meq/L (ref 3.7–5.3)
Potassium: 5.5 mEq/L — ABNORMAL HIGH (ref 3.7–5.3)
SODIUM: 134 meq/L — AB (ref 137–147)
Sodium: 127 mEq/L — ABNORMAL LOW (ref 137–147)

## 2014-03-19 LAB — CBC WITH DIFFERENTIAL/PLATELET
BASOS ABS: 0 10*3/uL (ref 0.0–0.1)
BASOS PCT: 0 % (ref 0–1)
Eosinophils Absolute: 0.1 10*3/uL (ref 0.0–0.7)
Eosinophils Relative: 1 % (ref 0–5)
HEMATOCRIT: 29.1 % — AB (ref 36.0–46.0)
Hemoglobin: 9 g/dL — ABNORMAL LOW (ref 12.0–15.0)
Lymphocytes Relative: 16 % (ref 12–46)
Lymphs Abs: 1.5 10*3/uL (ref 0.7–4.0)
MCH: 24.8 pg — ABNORMAL LOW (ref 26.0–34.0)
MCHC: 30.9 g/dL (ref 30.0–36.0)
MCV: 80.2 fL (ref 78.0–100.0)
MONO ABS: 0.6 10*3/uL (ref 0.1–1.0)
Monocytes Relative: 6 % (ref 3–12)
NEUTROS ABS: 7.1 10*3/uL (ref 1.7–7.7)
Neutrophils Relative %: 77 % (ref 43–77)
PLATELETS: 378 10*3/uL (ref 150–400)
RBC: 3.63 MIL/uL — ABNORMAL LOW (ref 3.87–5.11)
RDW: 19.1 % — AB (ref 11.5–15.5)
WBC: 9.3 10*3/uL (ref 4.0–10.5)

## 2014-03-19 LAB — CBG MONITORING, ED
GLUCOSE-CAPILLARY: 409 mg/dL — AB (ref 70–99)
Glucose-Capillary: 572 mg/dL (ref 70–99)
Glucose-Capillary: 33 mg/dL — CL (ref 70–99)
Glucose-Capillary: 91 mg/dL (ref 70–99)

## 2014-03-19 LAB — I-STAT VENOUS BLOOD GAS, ED
ACID-BASE DEFICIT: 10 mmol/L — AB (ref 0.0–2.0)
Bicarbonate: 15.4 mEq/L — ABNORMAL LOW (ref 20.0–24.0)
O2 Saturation: 60 %
PH VEN: 7.289 (ref 7.250–7.300)
PO2 VEN: 34 mmHg (ref 30.0–45.0)
Patient temperature: 98.5
TCO2: 16 mmol/L (ref 0–100)
pCO2, Ven: 32.2 mmHg — ABNORMAL LOW (ref 45.0–50.0)

## 2014-03-19 MED ORDER — SODIUM CHLORIDE 0.9 % IV SOLN: 1000.0000 mL | Freq: Once | INTRAVENOUS | Status: DC

## 2014-03-19 MED ORDER — INSULIN REGULAR HUMAN 100 UNIT/ML IJ SOLN: INTRAMUSCULAR | Status: DC

## 2014-03-19 MED ORDER — SODIUM CHLORIDE 0.9 % IV BOLUS (SEPSIS)
1000.0000 mL | Freq: Once | INTRAVENOUS | Status: AC
  Administered 2014-03-19: 250 mL via INTRAVENOUS

## 2014-03-19 MED ORDER — DEXTROSE-NACL 5-0.45 % IV SOLN: INTRAVENOUS | Status: DC

## 2014-03-19 MED ORDER — SODIUM CHLORIDE 0.9 % IV SOLN: 1000.0000 mL | INTRAVENOUS | Status: DC

## 2014-03-19 MED ORDER — SODIUM CHLORIDE 0.9 % IV SOLN
INTRAVENOUS | Status: DC
  Administered 2014-03-19: 5.4 [IU]/h via INTRAVENOUS

## 2014-03-19 NOTE — Discharge Instructions (Signed)
Follow up with your doctor or the wound center or continued or worsening symptoms. Makes surethat you take your doxycycline Skin Ulcer A skin ulcer is an open sore that can be shallow or deep. Skin ulcers sometimes become infected and are difficult to treat. It may be 1 month or longer before real healing progress is made. CAUSES   Injury.  Problems with the veins or arteries.  Diabetes.  Insect bites.  Bedsores.  Inflammatory conditions. SYMPTOMS   Pain, redness, swelling, and tenderness around the ulcer.  Fever.  Bleeding from the ulcer.  Yellow or clear fluid coming from the ulcer. DIAGNOSIS  There are many types of skin ulcers. Any open sores will be examined. Certain tests will be done to determine the kind of ulcer you have. The right treatment depends on the type of ulcer you have. TREATMENT  Treatment is a long-term challenge. It may include:  Wearing an elastic wrap, compression stockings, or gel cast over the ulcer area.  Taking antibiotic medicines or putting antibiotic creams on the affected area if there is an infection. HOME CARE INSTRUCTIONS  Put on your bandages (dressings), wraps, or casts over the ulcer as directed by your caregiver.  Change all dressings as directed by your caregiver.  Take all medicines as directed by your caregiver.  Keep the affected area clean and dry.  Avoid injuries to the affected area.  Eat a well-balanced, healthy diet that includes plenty of fruit and vegetables.  If you smoke, consider quitting or decreasing the amount of cigarettes you smoke.  Once the ulcer heals, get regular exercise as directed by your caregiver.  Work with your caregiver to make sure your blood pressure, cholesterol, and diabetes are well-controlled.  Keep your skin moisturized. Dry skin can crack and lead to skin ulcers. SEEK IMMEDIATE MEDICAL CARE IF:   Your pain gets worse.  You have swelling, redness, or fluids around the ulcer.  You  have chills.  You have a fever. MAKE SURE YOU:   Understand these instructions.  Will watch your condition.  Will get help right away if you are not doing well or get worse. Document Released: 04/26/2004 Document Revised: 06/11/2011 Document Reviewed: 11/03/2010 [facility] Patient Information 2015 [place], [place]. This information is not intended to replace advice given to you by your health care provider. Make sure you discuss any questions you have with your health care provider.

## 2014-03-19 NOTE — ED Notes (Signed)
Injury to [place] the [place]. She has an open wound to the bottom of her left foot. Hx of diabetic neuropathy and multiple broken bones.

## 2014-03-19 NOTE — ED Notes (Signed)
Pt. Has an insulin pump per her pump reading at 1825 and out CBG check at 1825 the Pt. CBG is greater than 600.   The pt. Is in no distress.

## 2014-03-19 NOTE — ED Notes (Signed)
Wrong documentation of 91 Insulin rate. 0.3 is the correct rate.

## 2014-03-19 NOTE — ED Provider Notes (Signed)
CSN: [id number]     Arrival date & time 03/19/14  1710 History   First MD Initiated Contact with Patient 03/19/14 1723     Chief Complaint  Patient presents with  . Foot Pain     (Consider location/radiation/quality/duration/timing/severity/associated sxs/prior Treatment) HPI Comments: Pt comes in today with left foot pain that started about 1 week ago while she was at the beach. She states that she developed a diabetic foot ulcer to the bottom of her foot and while was gone. She states that she has had this recurring over the last 2 years. She states that she saw her doctor 3 days ago and was put on doxycycline. She states that she doesn't normally have sensation in her foot but she has had pain with this . She states that she almost lost some toes last year from this  The history is provided by the patient. No language interpreter was used.    Past Medical History  Diagnosis Date  . Diabetes mellitus   . Neuropathy in diabetes   . Hypertension   . Hypercholesteremia   . H/O seasonal allergies   . Anemia   . Left foot infection    Past Surgical History  Procedure Laterality Date  . Cesarean section    . Eye surgery    . Hemrrhoidectomy    . Peripherally inserted central catheter insertion     No family history on file. History  Substance Use Topics  . Smoking status: Former Smoker    Quit date: 05/16/2008  . Smokeless tobacco: Never Used  . Alcohol Use: 0.5 oz/week    1 drink(s) per week     Comment: Occasional   OB History    No data available     Review of Systems  All other systems reviewed and are negative.     Allergies  Ciprofloxacin and Cleocin  Home Medications   Prior to Admission medications   Medication Sig Start Date End Date Taking? Authorizing Provider  albuterol (PROVENTIL HFA;VENTOLIN HFA) 108 (90 BASE) MCG/ACT inhaler Inhale 2 puffs into the lungs every 6 (six) hours as needed. S.O.B.    Historical Provider, MD  calcitRIOL (ROCALTROL)  0.25 MCG capsule Take 0.25 mcg by mouth.    Historical Provider, MD  ferrous sulfate 325 (65 FE) MG tablet Take 325 mg by mouth. 11/23/13   Historical Provider, MD  insulin aspart (NOVOLOG) 100 UNIT/ML injection Use max 65 units per day with pump 09/02/13   Elayne Snare, MD  lisinopril (PRINIVIL,ZESTRIL) 10 MG tablet Take 10 mg by mouth. 12/24/13 12/24/14  Historical Provider, MD  losartan (COZAAR) 25 MG tablet Take 1 tablet (25 mg total) by mouth daily. 01/07/14   Elayne Snare, MD  mupirocin ointment (BACTROBAN) 2 %  11/23/13   Historical Provider, MD  pravastatin (PRAVACHOL) 40 MG tablet Take 0.5 tablets (20 mg total) by mouth daily. 08/05/13   Elayne Snare, MD  sodium bicarbonate 650 MG tablet Take 1 tablet (650 mg total) by mouth 3 (three) times daily. 06/15/13   Bonnielee Haff, MD  valACYclovir (VALTREX) 500 MG tablet Take 500 mg by mouth. 06/16/13 06/17/14  Historical Provider, MD   BP 145/77 mmHg  Pulse 86  Temp(Src) 98.3 F (36.8 C) (Oral)  Resp 20  Ht 5\' 6"  (1.676 m)  Wt 135 lb (61.236 kg)  BMI 21.80 kg/m2  SpO2 100%  LMP 02/18/2014 Physical Exam  Constitutional: She is oriented to person, place, and time. She appears well-developed and well-nourished.  Cardiovascular:  Normal rate and regular rhythm.   Pulmonary/Chest: Effort normal and breath sounds normal.  Musculoskeletal:  Indurated .5cm ulcer to the bottom of the left foot. No redness, drainage or warmth noted the area.no fluctuance noted around the area  Neurological: She is alert and oriented to person, place, and time.  Skin: Skin is warm and dry.  Nursing note and vitals reviewed.   ED Course  Procedures (including critical care time) Labs Review Labs Reviewed  CBC WITH DIFFERENTIAL - Abnormal; Notable for the following:    RBC 3.63 (*)    Hemoglobin 9.0 (*)    HCT 29.1 (*)    MCH 24.8 (*)    RDW 19.1 (*)    All other components within normal limits  BASIC METABOLIC PANEL - Abnormal; Notable for the following:    Sodium 127  (*)    Potassium 5.5 (*)    Chloride 94 (*)    CO2 17 (*)    Glucose, Bld 737 (*)    BUN 38 (*)    Creatinine, Ser 1.50 (*)    GFR calc non Af Amer 42 (*)    GFR calc Af Amer 49 (*)    Anion gap 16 (*)    All other components within normal limits  BASIC METABOLIC PANEL - Abnormal; Notable for the following:    Sodium 134 (*)    CO2 16 (*)    Glucose, Bld 350 (*)    BUN 36 (*)    Creatinine, Ser 1.50 (*)    GFR calc non Af Amer 42 (*)    GFR calc Af Amer 49 (*)    Anion gap 16 (*)    All other components within normal limits  CBG MONITORING, ED - Abnormal; Notable for the following:    Glucose-Capillary >600 (*)    All other components within normal limits  I-STAT VENOUS BLOOD GAS, ED - Abnormal; Notable for the following:    pCO2, Ven 32.2 (*)    Bicarbonate 15.4 (*)    Acid-base deficit 10.0 (*)    All other components within normal limits  CBG MONITORING, ED - Abnormal; Notable for the following:    Glucose-Capillary 572 (*)    All other components within normal limits  CBG MONITORING, ED - Abnormal; Notable for the following:    Glucose-Capillary 409 (*)    All other components within normal limits  CBG MONITORING, ED    Imaging Review Dg Foot 2 Views Left  03/19/2014   CLINICAL DATA:  Open wound to the bottom of the LEFT foot. Diabetic neuropathy.  EXAM: LEFT FOOT - 2 VIEW  COMPARISON:  04/30/2013.  FINDINGS: Diffuse osteopenia. Vascular calcification. Cellulitic change along the plantar aspect of the foot, with flatfoot deformity as seen on the lateral view. No definite changes of osteomyelitis. Indistinct midfoot osseous changes with remodeling of the metatarsal bases.  IMPRESSION: Chronic osseous changes as described. No definite findings to suggest active osteomyelitis. Cellulitic change with flatfoot deformity.   Electronically Signed   By: Rolla Flatten M.D.   On: 03/19/2014 22:52   Dg Foot Complete Right  03/19/2014   CLINICAL DATA:  Recent trauma.  Diabetic  neuropathy  EXAM: RIGHT FOOT COMPLETE - 3+ VIEW  COMPARISON:  Aug 11, 2012  FINDINGS: Frontal, oblique, and lateral views were obtained. There is a prior fracture of the fourth proximal phalanx which shows healing compared to the previous study. There is slight lateral angulation of the distal aspect of the bone  post trauma. There is no apparent acute fracture or dislocation. Joint spaces appear intact. No erosive change. No bony destruction. There are foci of arterial vascular calcification.  IMPRESSION: Prior fracture of the fourth proximal phalanx with remodeling. No acute fracture or dislocation. No erosive change or bony destruction. Extensive vascular calcification is consistent with known diabetes mellitus.   Electronically Signed   By: Lowella Grip M.D.   On: 03/19/2014 18:20     EKG Interpretation None      MDM   Final diagnoses:  Diabetic ulcer of left foot associated with type 1 diabetes mellitus  Hyperglycemia  Pain    No osteo noted to the foot. Don't think any change needs to be made to antibiotics. Pt is obvious a brittle diabetic. Pt sugar is 91 and pt was given food here. Pt to turn pump back on and will monitor sugar at home. Discussed return precautions    Glendell Docker, NP 03/19/14 [id number]  Malvin Johns, MD 03/19/14 2319

## 2014-03-20 LAB — CBG MONITORING, ED
GLUCOSE-CAPILLARY: 129 mg/dL — AB (ref 70–99)
Glucose-Capillary: 72 mg/dL (ref 70–99)

## 2014-03-23 IMAGING — CR DG ANKLE COMPLETE 3+V*L*
3 series · 3 of 3 positions shown · non-contrast
Comparison: Ankle radiographs 07/02/2011.

CLINICAL DATA: Diabetic neuropathy.  Left foot inflammation.
Erythema and painful for several days.

LEFT ANKLE COMPLETE - 3+ VIEW

[t ankle joint ap left]
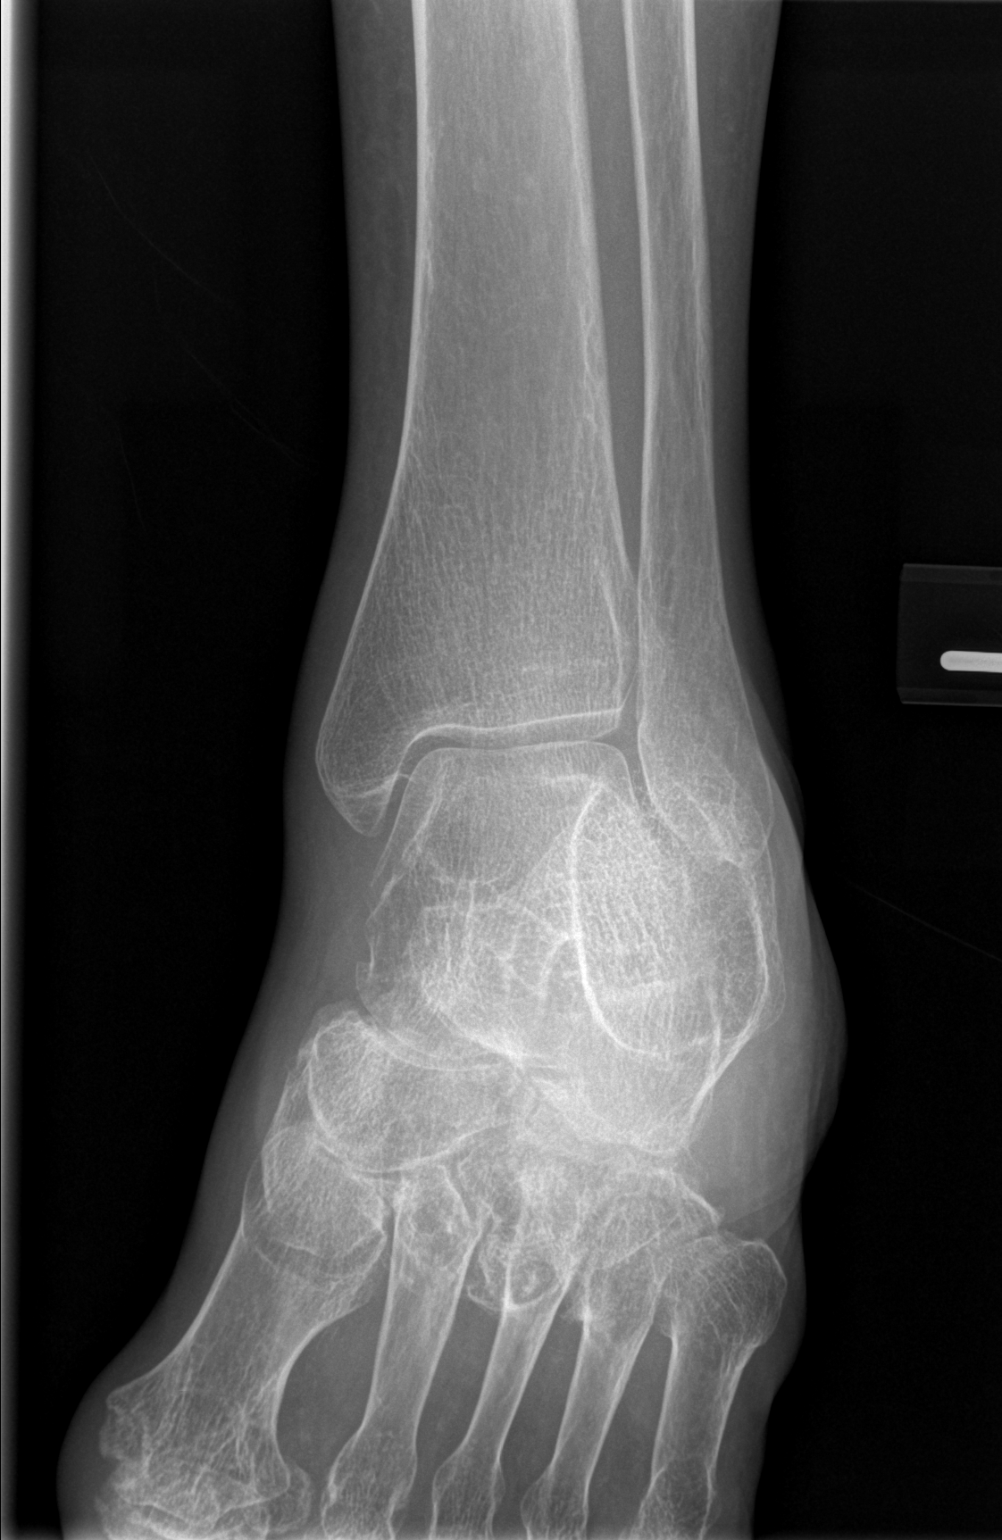

[t ankle joint oblique left]
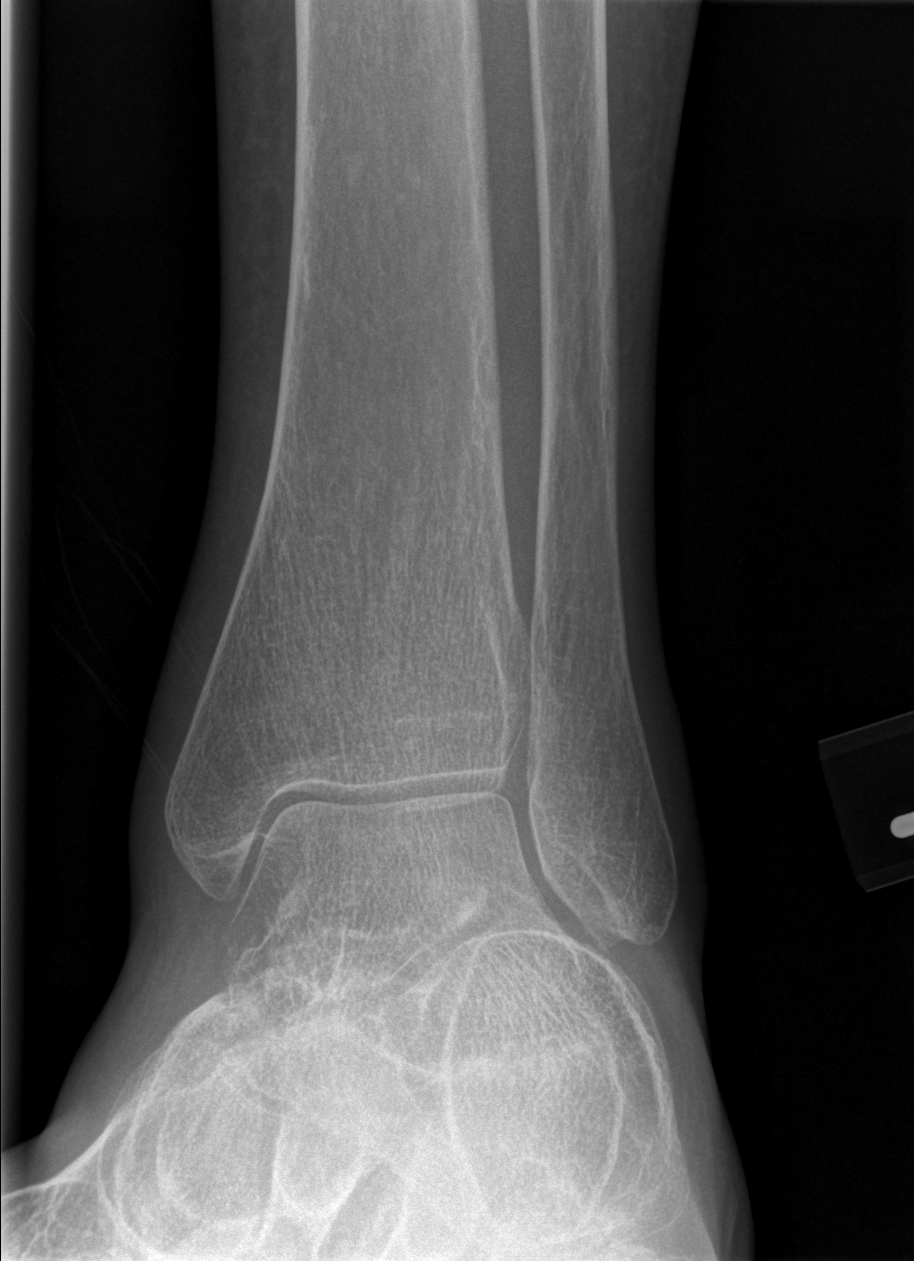

[t ankle joint lat left]
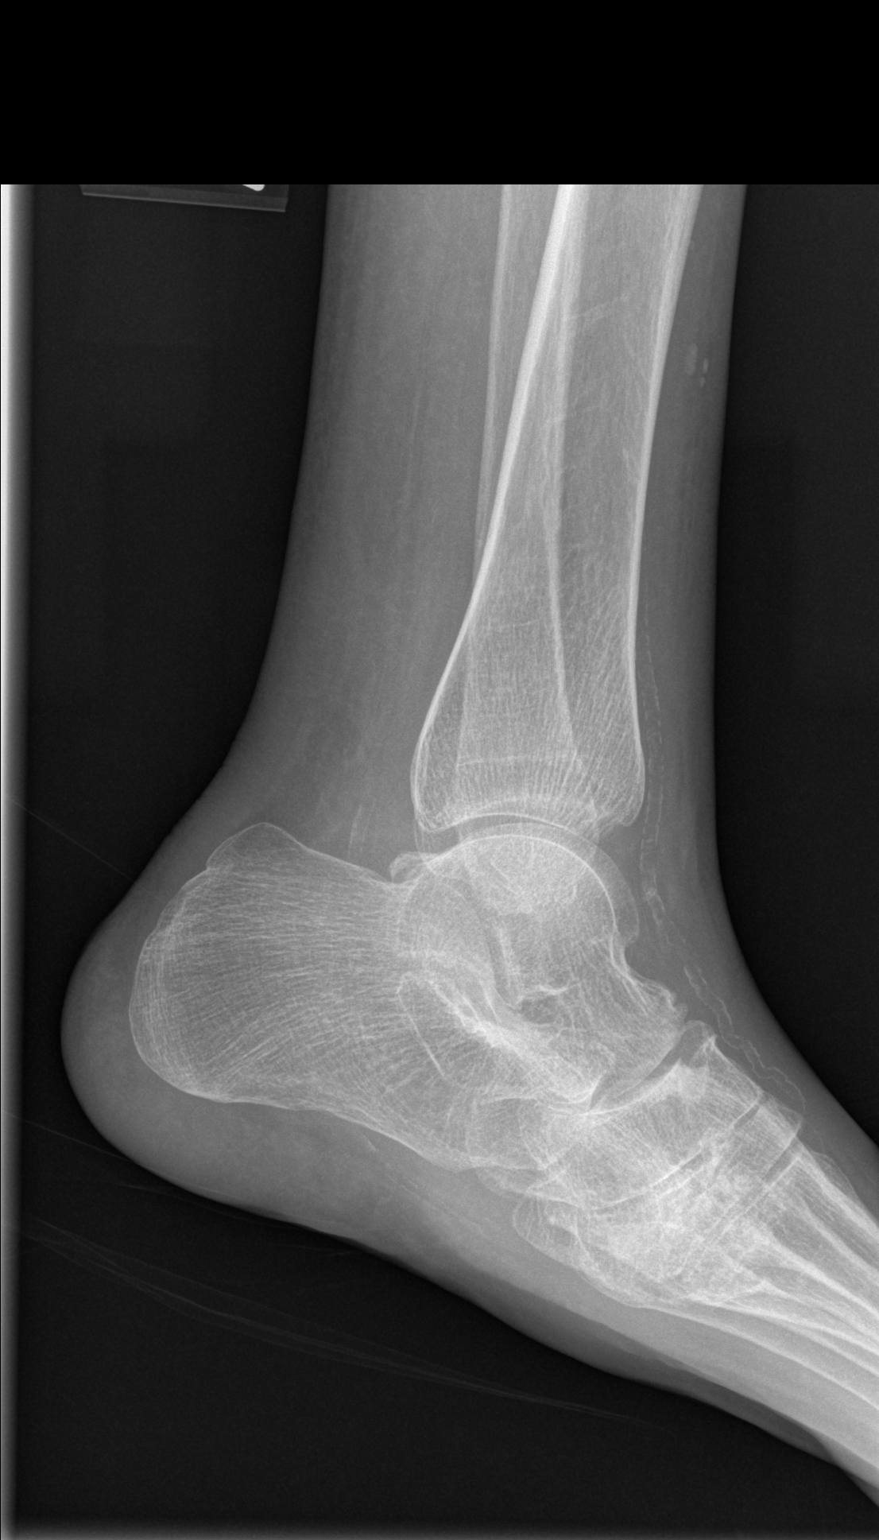

[3 of 3 positions shown; findings below may reference images not displayed]

FINDINGS: Diabetic small vessel atherosclerotic disease is present.
Osteopenia.  Ankle mortise is congruent.  Talar dome is intact.
There is no fracture about the ankle.  Refer to foot radiographs
today for further evaluation.
IMPRESSION: No acute osseous abnormality.

## 2014-03-23 IMAGING — CR DG FOOT COMPLETE 3+V*L*
3 series · 3 of 3 positions shown · non-contrast
Comparison: Multiple priors, most recently dating 08/14/2012.

CLINICAL DATA: Left foot pain.  Inflammation and erythema.

LEFT FOOT - COMPLETE 3+ VIEW

[t foot lat left]
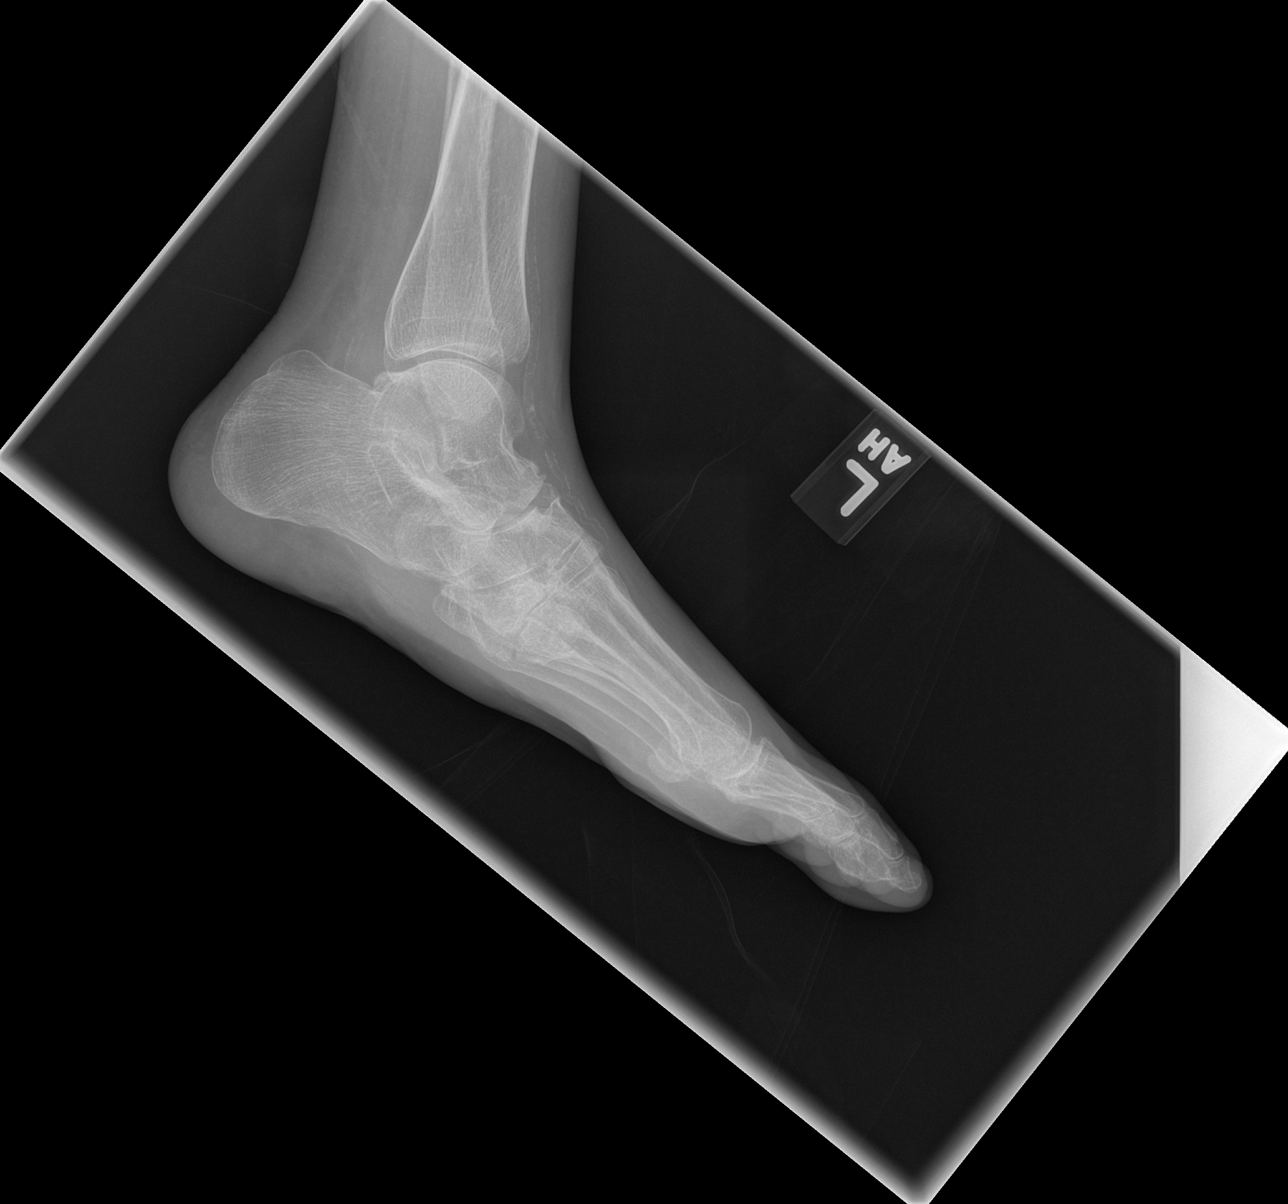

[t foot ap left]
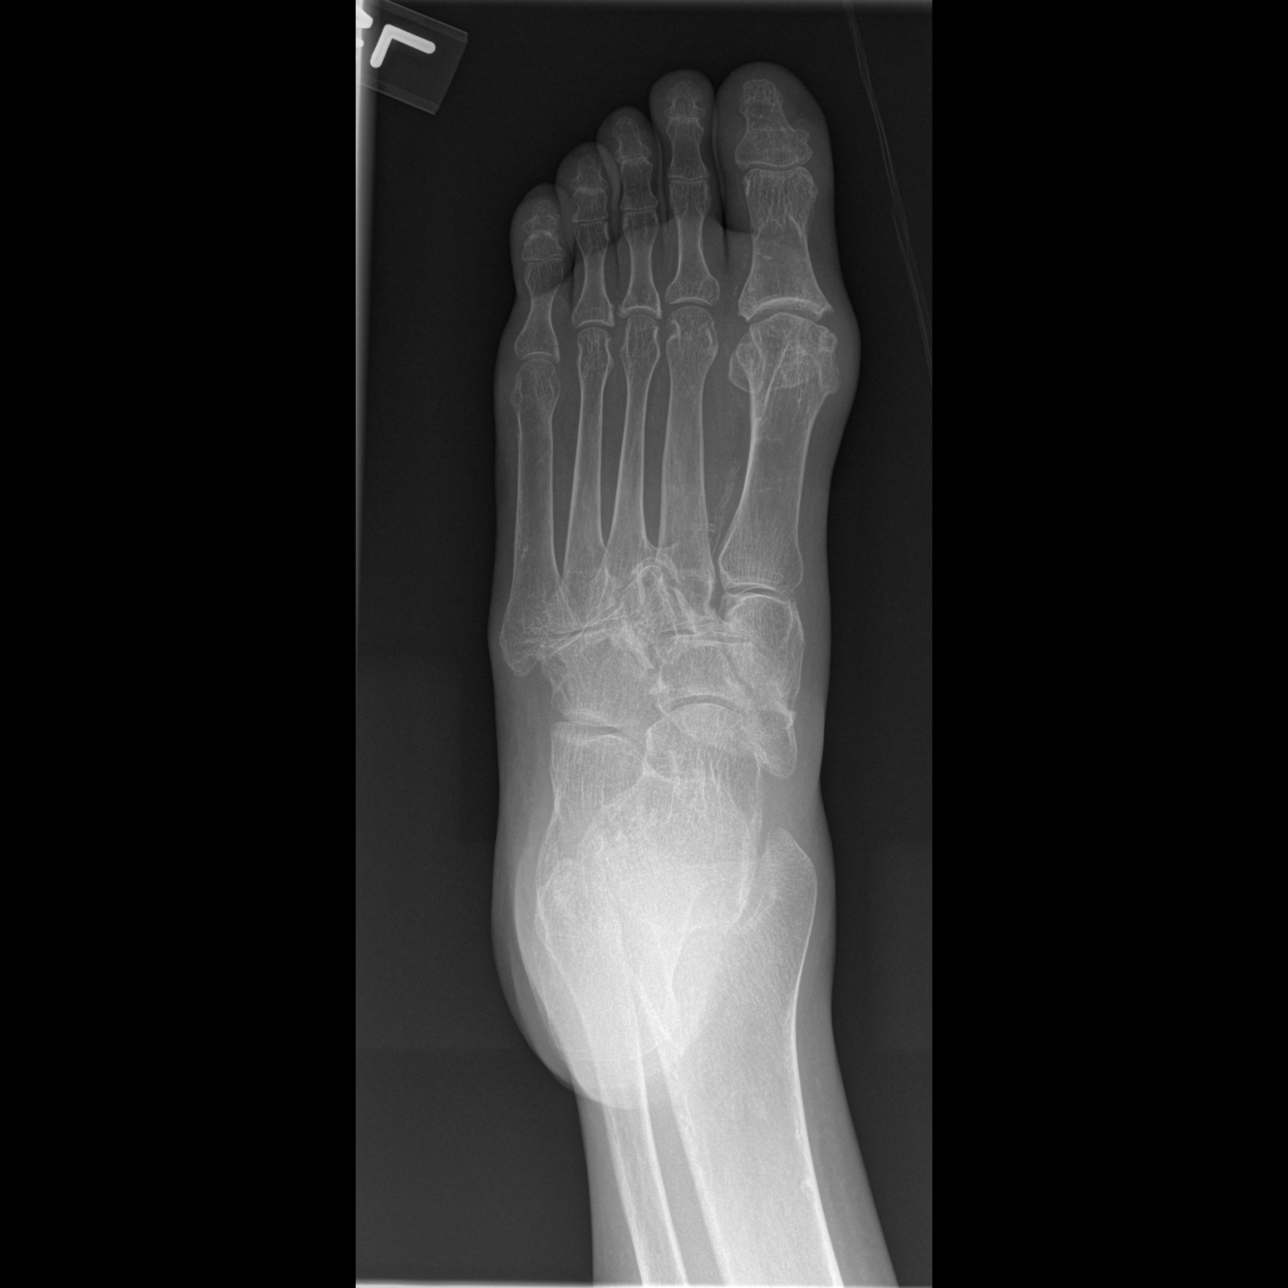

[t foot oblique left]
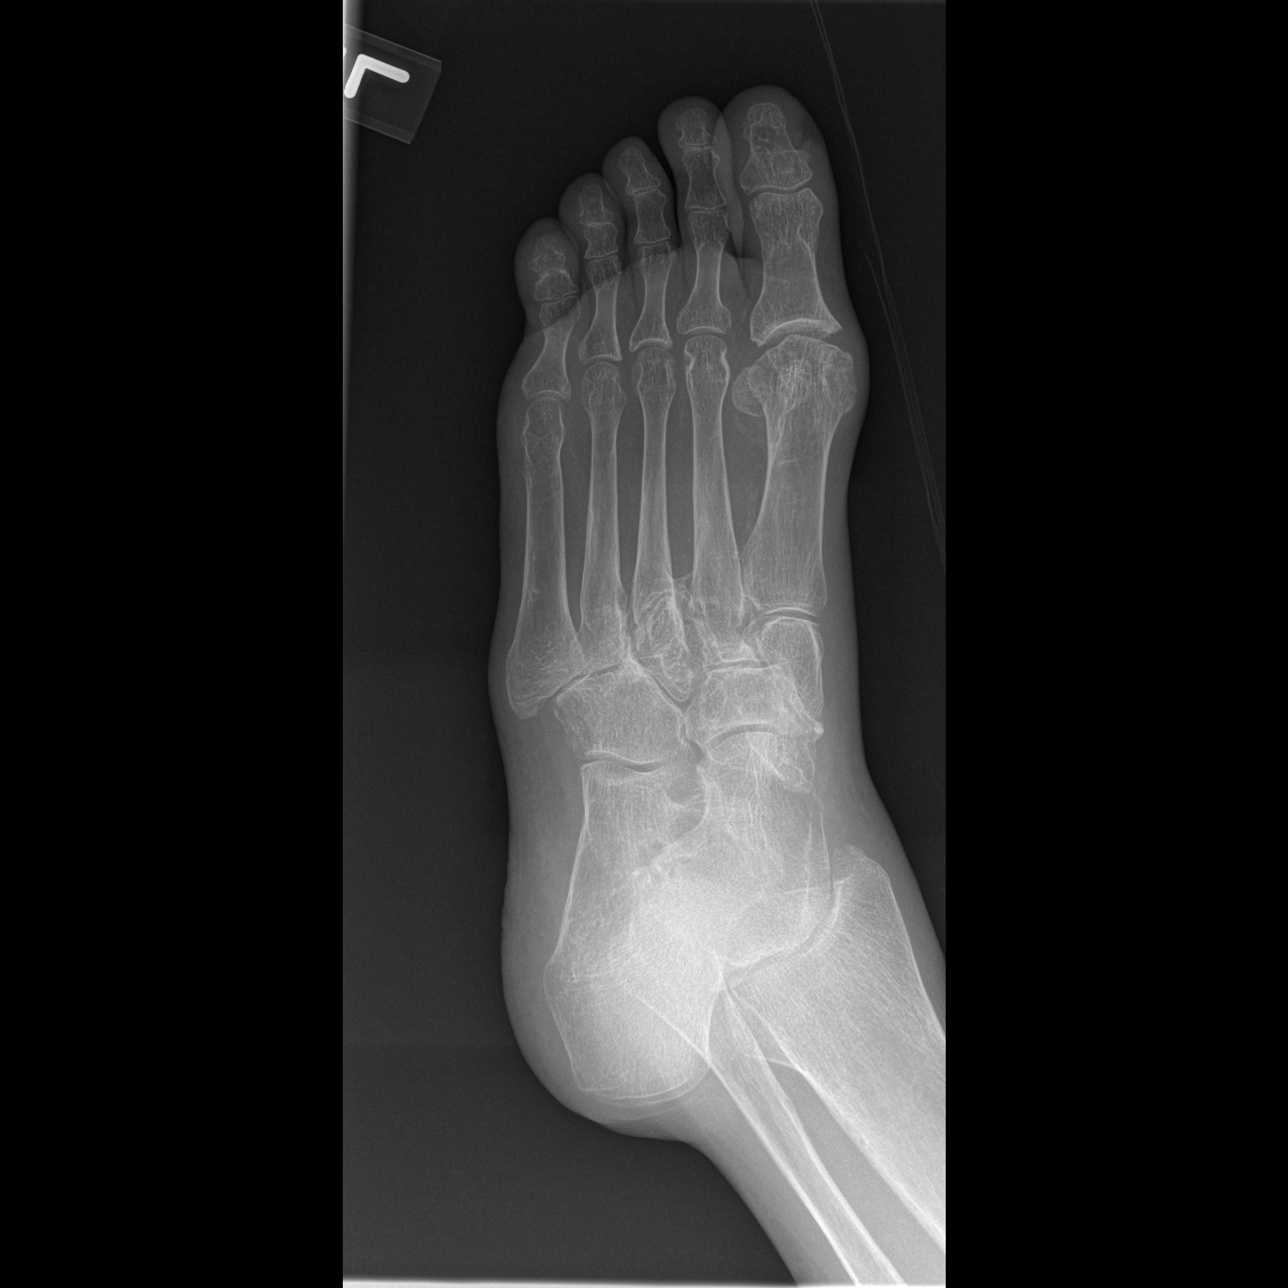

[3 of 3 positions shown; findings below may reference images not displayed]

FINDINGS: Pes planus is present.  Midfoot degenerative changes are
present.  Small vessel diabetic atherosclerotic calcification is
present.  Compared to the prior exam, there are no interval
changes.  Post-traumatic changes are present at the third and
second metatarsal base and intermetatarsal joints.  Moderate first
MTP joint osteoarthritis.  Compared to the prior exam, there is no
interval change.  The midfoot degenerative changes are most
compatible with a combination of post-traumatic and neuropathic
foot.

Significantly, no focal area of osteolysis are present to suggest
osteomyelitis.  Mild bunion incidentally noted.
IMPRESSION: No acute abnormality.  Combination of osteoarthritis of the foot
and acquired pes planus with neuropathic midfoot.

## 2014-03-24 ENCOUNTER — Encounter (HOSPITAL_BASED_OUTPATIENT_CLINIC_OR_DEPARTMENT_OTHER): Payer: Medicaid Other | Attending: General Surgery

## 2014-03-24 DIAGNOSIS — L97429 Non-pressure chronic ulcer of left heel and midfoot with unspecified severity: Secondary | ICD-10-CM | POA: Insufficient documentation

## 2014-03-24 DIAGNOSIS — E11621 Type 2 diabetes mellitus with foot ulcer: Secondary | ICD-10-CM | POA: Insufficient documentation

## 2014-03-25 NOTE — Progress Notes (Signed)
[facility]  NAME:  [name], [name] NO.:  [id number]  MEDICAL RECORD NO.:  [id number]      DATE OF BIRTH:  June 26, 1970  PHYSICIAN:  Judene Companion, M.D.           VISIT DATE:                                  OFFICE VISIT   This is a 43 year old juvenile diabetic, who I have known for several years.  She has had great problems with diabetic ulcers in the past and today, she presents after being at the beach in the [place] last week and she has developed an ulcer on the plantar aspect of her left foot. The ulcer is about 8 mm in diameter and appears to be in a Wagner 2.  X- rays show no bony involvement, but she does have a Charcot's foot, which is leading to her problems with her plantar ulcers on her feet.  We have cleared her up before by offloading and we are going to do that again starting today.  We put the felt around of the ulcer to offload it and we are treating it with collagen and I think it would be appropriate to order a Dermagraft, which can be done in the future if necessary.  Today, her vital signs were all normal.  Her blood pressure was 120/80, her pulse 80, respirations 20, and the ulcer is about 8 mm in diameter.  So, her diagnosis is type 1 diabetes, and plantar aspect of the left foot has a Wagner 2 diabetic foot ulcer.     Judene Companion, M.D.     PP/MEDQ  D:  03/24/2014  T:  03/25/2014  Job:  [id number]

## 2014-03-30 ENCOUNTER — Other Ambulatory Visit (HOSPITAL_BASED_OUTPATIENT_CLINIC_OR_DEPARTMENT_OTHER): Payer: Self-pay | Admitting: General Surgery

## 2014-03-30 DIAGNOSIS — L97429 Non-pressure chronic ulcer of left heel and midfoot with unspecified severity: Secondary | ICD-10-CM | POA: Diagnosis not present

## 2014-03-30 DIAGNOSIS — R52 Pain, unspecified: Secondary | ICD-10-CM

## 2014-03-30 DIAGNOSIS — E11621 Type 2 diabetes mellitus with foot ulcer: Secondary | ICD-10-CM | POA: Diagnosis not present

## 2014-04-02 HISTORY — PX: FOOT AMPUTATION THROUGH METATARSAL: SHX644

## 2014-04-06 ENCOUNTER — Ambulatory Visit (HOSPITAL_COMMUNITY)
Admission: RE | Admit: 2014-04-06 | Discharge: 2014-04-06 | Disposition: A | Discharge status: Home / Self Care | Payer: Medicaid Other | Source: Ambulatory Visit | Attending: General Surgery | Admitting: General Surgery

## 2014-04-06 DIAGNOSIS — R52 Pain, unspecified: Secondary | ICD-10-CM

## 2014-04-06 DIAGNOSIS — E11621 Type 2 diabetes mellitus with foot ulcer: Secondary | ICD-10-CM | POA: Insufficient documentation

## 2014-04-07 ENCOUNTER — Encounter (HOSPITAL_BASED_OUTPATIENT_CLINIC_OR_DEPARTMENT_OTHER): Payer: Medicaid Other | Attending: General Surgery

## 2014-04-07 DIAGNOSIS — E13621 Other specified diabetes mellitus with foot ulcer: Secondary | ICD-10-CM | POA: Insufficient documentation

## 2014-04-07 DIAGNOSIS — L97429 Non-pressure chronic ulcer of left heel and midfoot with unspecified severity: Secondary | ICD-10-CM | POA: Insufficient documentation

## 2014-04-14 DIAGNOSIS — E13621 Other specified diabetes mellitus with foot ulcer: Secondary | ICD-10-CM | POA: Diagnosis not present

## 2014-04-14 DIAGNOSIS — L97429 Non-pressure chronic ulcer of left heel and midfoot with unspecified severity: Secondary | ICD-10-CM | POA: Diagnosis not present

## 2014-04-14 IMAGING — CR DG FOOT COMPLETE 3+V*L*
3 series · 3 of 3 positions shown · non-contrast
Comparison: 08/14/2012 and 10/26/2012.

CLINICAL DATA: The patient has open wounds.

LEFT FOOT - COMPLETE 3+ VIEW

[t foot ap left]
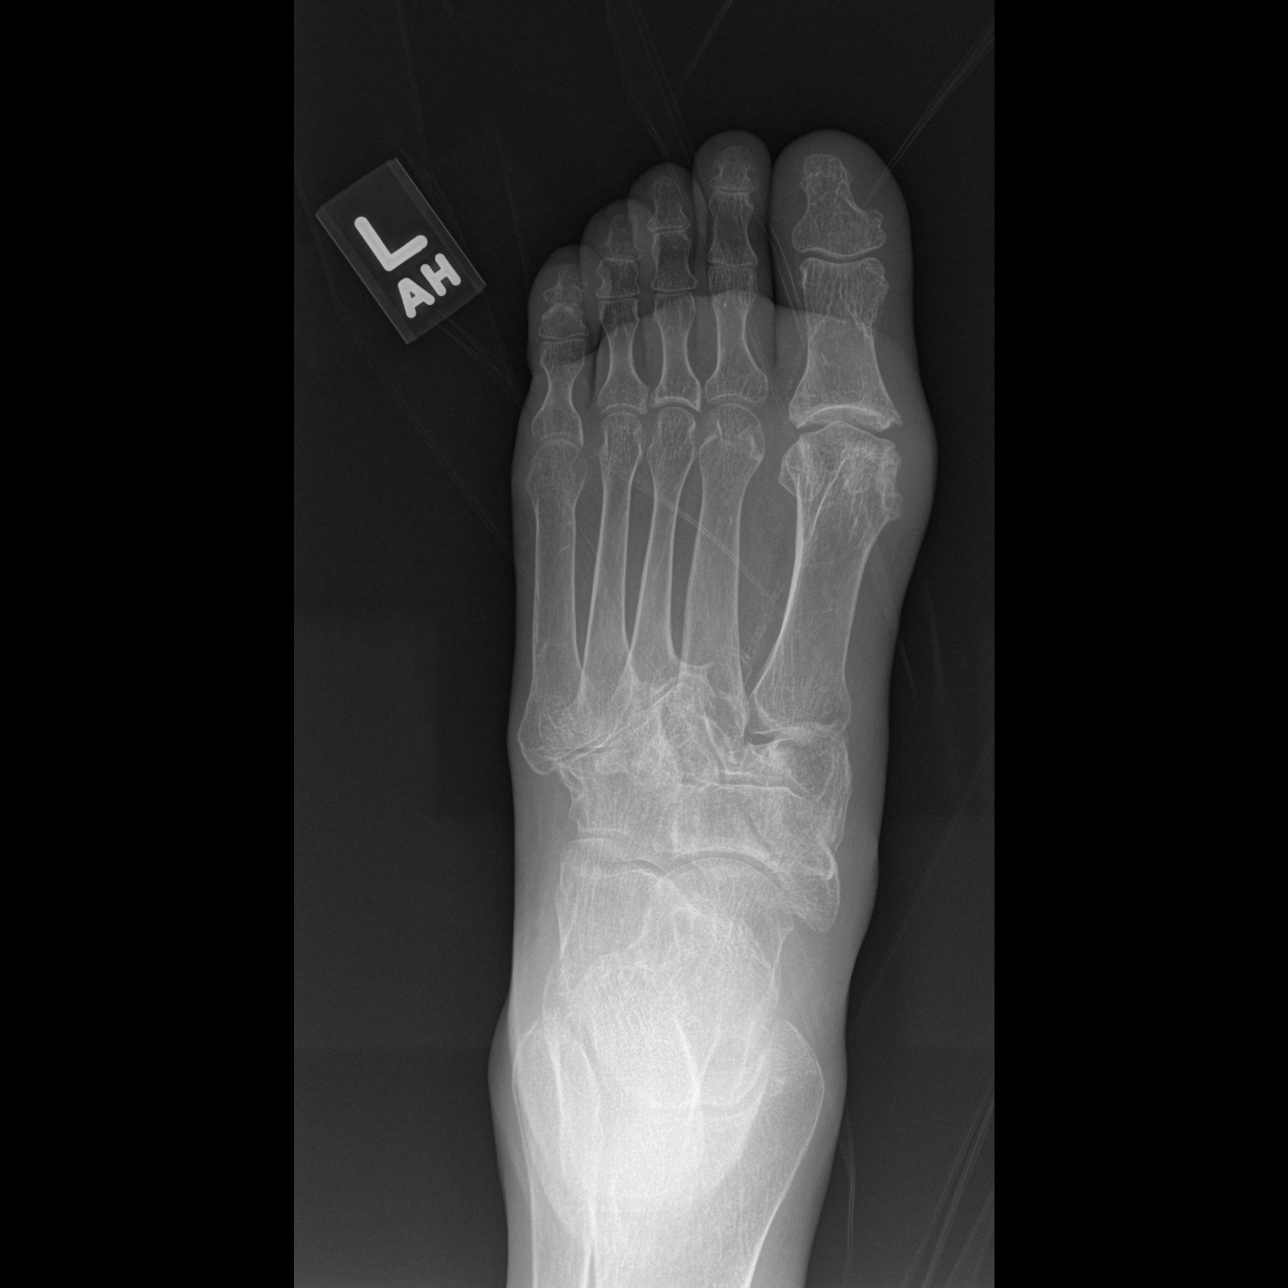

[t foot oblique left]
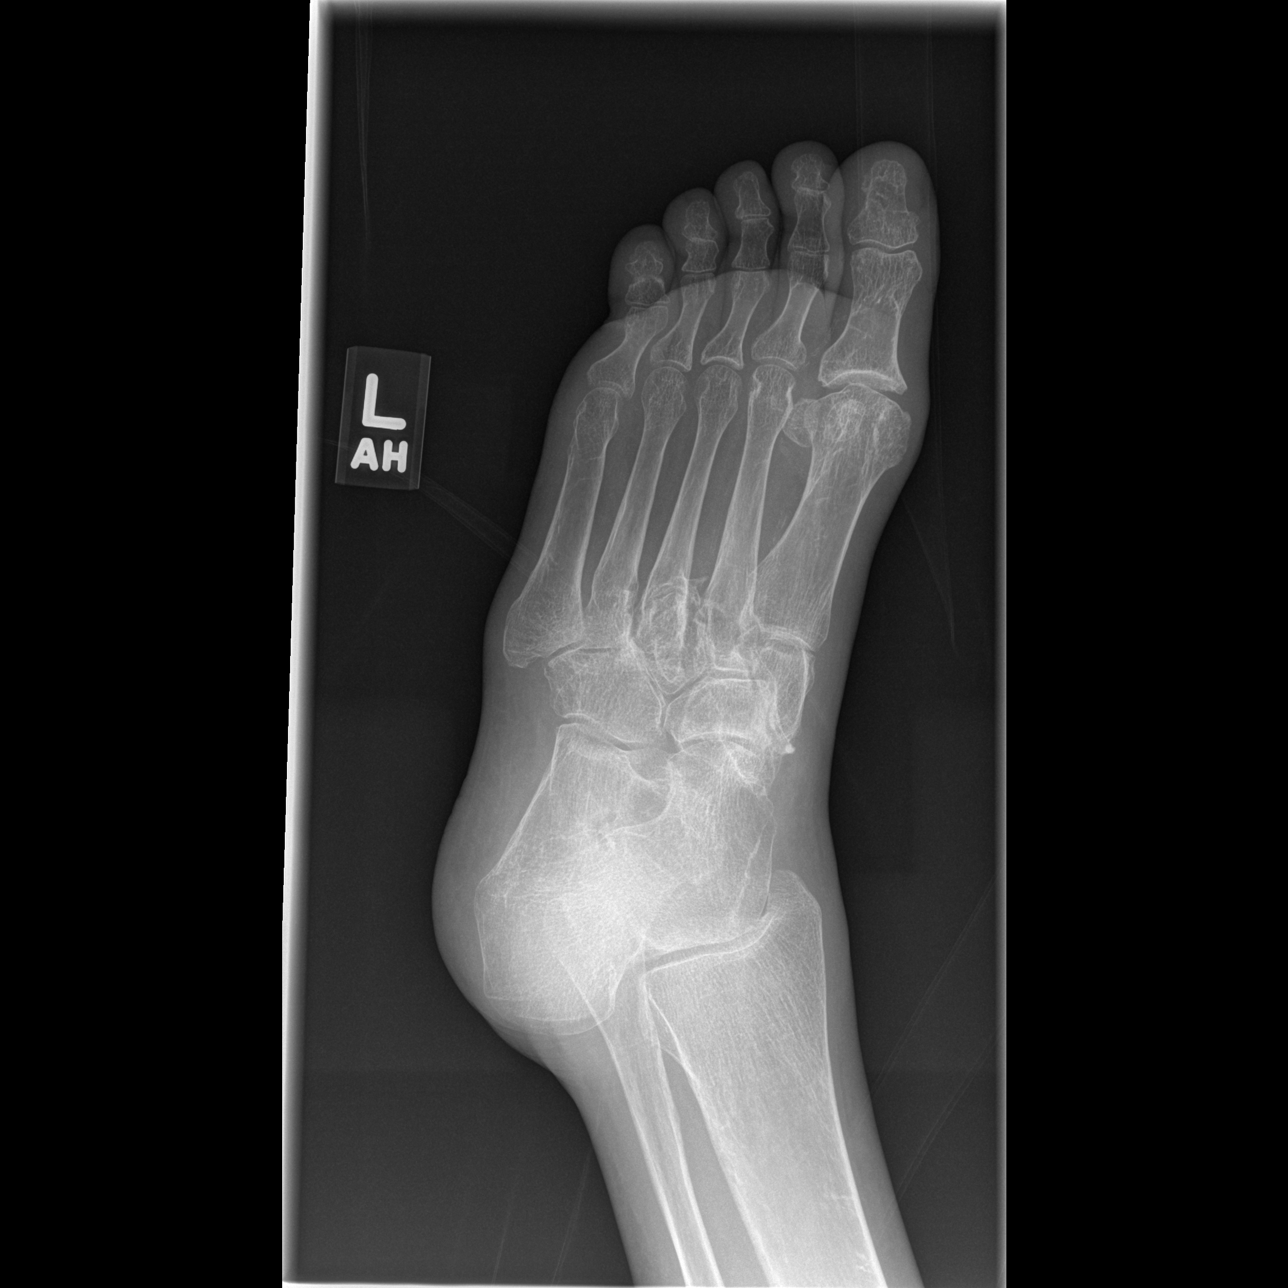

[t foot lat left]
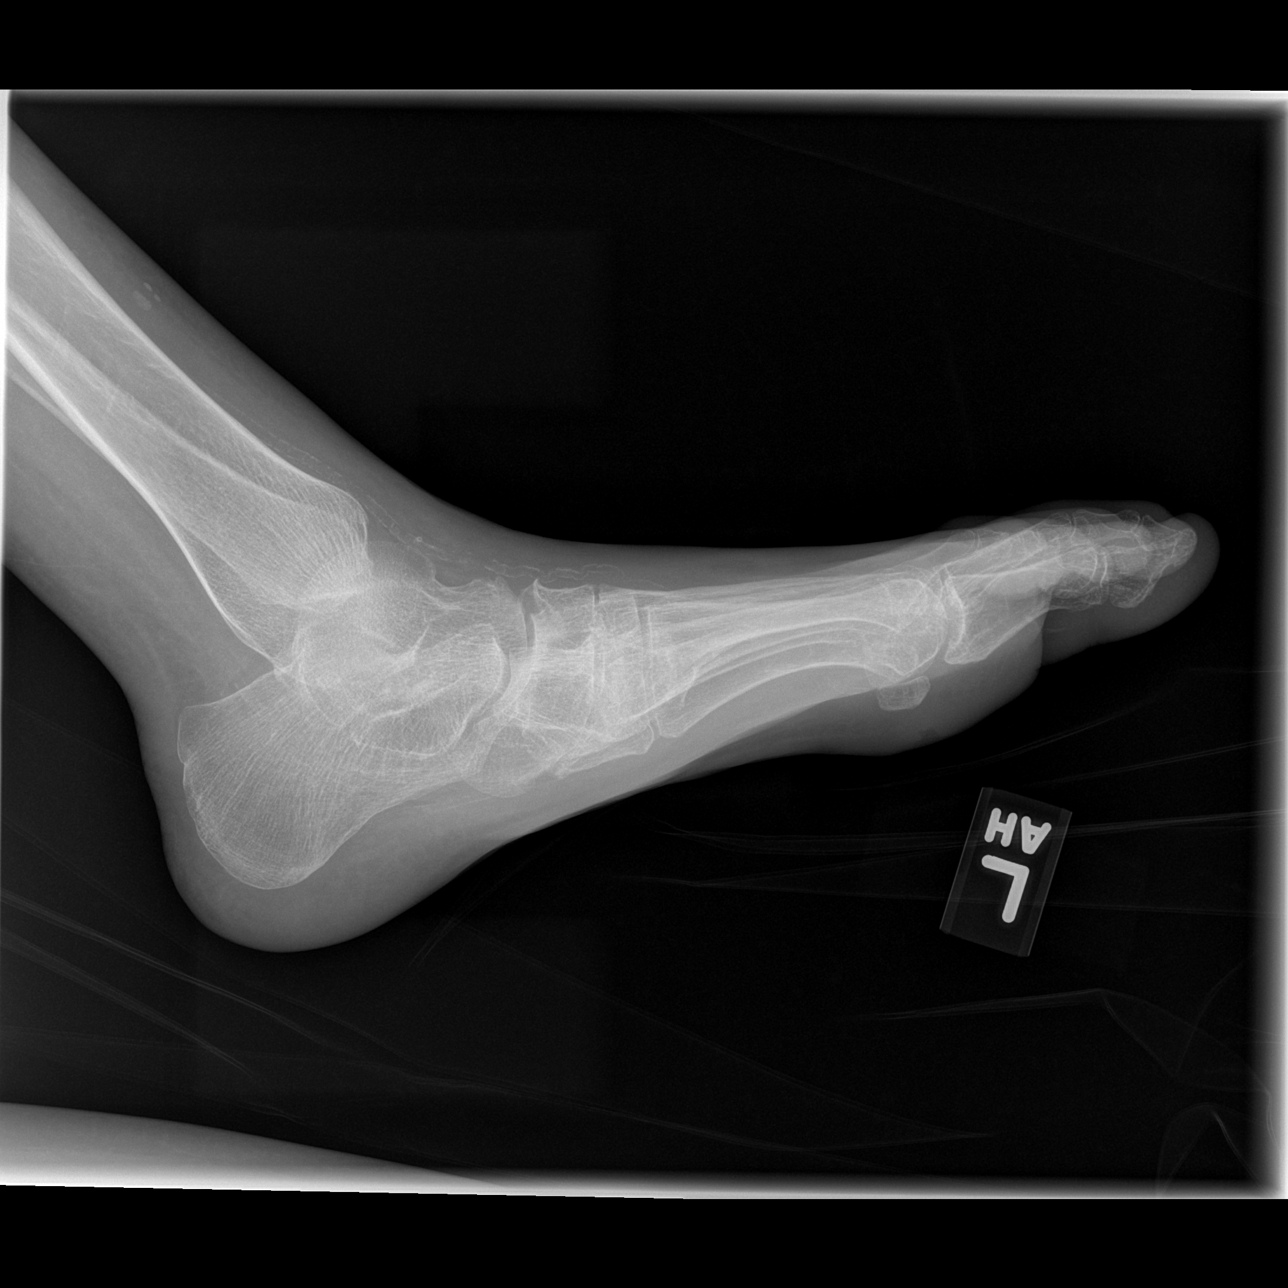

[3 of 3 positions shown; findings below may reference images not displayed]

FINDINGS: Diffuse bone demineralization.  Degenerative changes at
the first metatarsophalangeal joint and within multiple intertarsal
joints.  Past planus.  No evidence of acute fracture or subluxation
of the left foot.  There is a focal skin defect with underlying
swelling of the plantar aspect of the metatarsal region.  No
radiopaque foreign bodies are demonstrated.  No evidence of bone
erosion or cortical loss to suggest osteomyelitis.
IMPRESSION: Plantar soft tissue swelling and skin defect consistent with
ulceration.  No radiopaque soft tissue foreign bodies.  No
radiographic findings suggesting osteomyelitis.  Chronic bone
demineralization, degenerative changes, and past planus similar
previous study.

## 2014-04-16 ENCOUNTER — Encounter (HOSPITAL_COMMUNITY)
Admission: RE | Admit: 2014-04-16 | Discharge: 2014-04-16 | Disposition: A | Discharge status: Home / Self Care | Payer: Medicaid Other | Source: Ambulatory Visit | Attending: Nephrology | Admitting: Nephrology

## 2014-04-16 DIAGNOSIS — D631 Anemia in chronic kidney disease: Secondary | ICD-10-CM | POA: Diagnosis not present

## 2014-04-16 DIAGNOSIS — N184 Chronic kidney disease, stage 4 (severe): Secondary | ICD-10-CM | POA: Diagnosis present

## 2014-04-16 LAB — POCT HEMOGLOBIN-HEMACUE: Hemoglobin: 8.9 g/dL — ABNORMAL LOW (ref 12.0–15.0)

## 2014-04-16 MED ORDER — EPOETIN ALFA 10000 UNIT/ML IJ SOLN
INTRAMUSCULAR | Status: DC
Start: 2014-04-16 — End: 2014-04-17
  Filled 2014-04-16: qty 1

## 2014-04-16 MED ORDER — EPOETIN ALFA 10000 UNIT/ML IJ SOLN
5000.0000 [IU] | INTRAMUSCULAR | Status: DC
  Administered 2014-04-16: 5000 [IU] via SUBCUTANEOUS

## 2014-04-16 NOTE — Discharge Instructions (Signed)
Epoetin Alfa injection °What is this medicine? °EPOETIN ALFA (e POE e tin AL fa) helps your body make more red blood cells. This medicine is used to treat anemia caused by chronic kidney failure, cancer chemotherapy, or HIV-therapy. It may also be used before surgery if you have anemia. °This medicine may be used for other purposes; ask your health care provider or pharmacist if you have questions. °COMMON BRAND NAME(S): Epogen, Procrit °What should I tell my health care provider before I take this medicine? °They need to know if you have any of these conditions: °-blood clotting disorders °-cancer patient not on chemotherapy °-cystic fibrosis °-heart disease, such as angina or heart failure °-hemoglobin level of 12 g/dL or greater °-high blood pressure °-low levels of folate, iron, or vitamin B12 °-seizures °-an unusual or allergic reaction to erythropoietin, albumin, benzyl alcohol, hamster proteins, other medicines, foods, dyes, or preservatives °-pregnant or trying to get pregnant °-breast-feeding °How should I use this medicine? °This medicine is for injection into a vein or under the skin. It is usually given by a health care professional in a hospital or clinic setting. °If you get this medicine at home, you will be taught how to prepare and give this medicine. Use exactly as directed. Take your medicine at regular intervals. Do not take your medicine more often than directed. °It is important that you put your used needles and syringes in a special sharps container. Do not put them in a trash can. If you do not have a sharps container, call your pharmacist or healthcare provider to get one. °Talk to your pediatrician regarding the use of this medicine in children. While this drug may be prescribed for selected conditions, precautions do apply. °Overdosage: If you think you have taken too much of this medicine contact a poison control center or emergency room at once. °NOTE: This medicine is only for you. Do  not share this medicine with others. °What if I miss a dose? °If you miss a dose, take it as soon as you can. If it is almost time for your next dose, take only that dose. Do not take double or extra doses. °What may interact with this medicine? °Do not take this medicine with any of the following medications: °-darbepoetin alfa °This list may not describe all possible interactions. Give your health care provider a list of all the medicines, herbs, non-prescription drugs, or dietary supplements you use. Also tell them if you smoke, drink alcohol, or use illegal drugs. Some items may interact with your medicine. °What should I watch for while using this medicine? °Visit your prescriber or health care professional for regular checks on your progress and for the needed blood tests and blood pressure measurements. It is especially important for the doctor to make sure your hemoglobin level is in the desired range, to limit the risk of potential side effects and to give you the best benefit. Keep all appointments for any recommended tests. Check your blood pressure as directed. Ask your doctor what your blood pressure should be and when you should contact him or her. °As your body makes more red blood cells, you may need to take iron, folic acid, or vitamin B supplements. Ask your doctor or health care provider which products are right for you. If you have kidney disease continue dietary restrictions, even though this medication can make you feel better. Talk with your doctor or health care professional about the foods you eat and the vitamins that you take. °What   side effects may I notice from receiving this medicine? °Side effects that you should report to your doctor or health care professional as soon as possible: °-allergic reactions like skin rash, itching or hives, swelling of the face, lips, or tongue °-breathing problems °-changes in vision °-chest pain °-confusion, trouble speaking or understanding °-feeling  faint or lightheaded, falls °-high blood pressure °-muscle aches or pains °-pain, swelling, warmth in the leg °-rapid weight gain °-severe headaches °-sudden numbness or weakness of the face, arm or leg °-trouble walking, dizziness, loss of balance or coordination °-seizures (convulsions) °-swelling of the ankles, feet, hands °-unusually weak or tired °Side effects that usually do not require medical attention (report to your doctor or health care professional if they continue or are bothersome): °-diarrhea °-fever, chills (flu-like symptoms) °-headaches °-nausea, vomiting °-redness, stinging, or swelling at site where injected °This list may not describe all possible side effects. Call your doctor for medical advice about side effects. You may report side effects to FDA at 1-800-FDA-1088. °Where should I keep my medicine? °Keep out of the reach of children. °Store in a refrigerator between 2 and 8 degrees C (36 and 46 degrees F). Do not freeze or shake. Throw away any unused portion if using a single-dose vial. Multi-dose vials can be kept in the refrigerator for up to 21 days after the initial dose. Throw away unused medicine. °NOTE: This sheet is a summary. It may not cover all possible information. If you have questions about this medicine, talk to your doctor, pharmacist, or health care provider. °© 2015, [employer]. (2008-03-02 10:25:44) ° °

## 2014-04-18 NOTE — Progress Notes (Signed)
This encounter was created in error - please disregard.

## 2014-04-21 ENCOUNTER — Encounter (HOSPITAL_COMMUNITY)
Admission: RE | Admit: 2014-04-21 | Discharge: 2014-04-21 | Disposition: A | Discharge status: Home / Self Care | Payer: Medicaid Other | Source: Ambulatory Visit | Attending: Nephrology | Admitting: Nephrology

## 2014-04-21 ENCOUNTER — Encounter (HOSPITAL_COMMUNITY): Payer: Medicaid Other

## 2014-04-21 DIAGNOSIS — N184 Chronic kidney disease, stage 4 (severe): Secondary | ICD-10-CM

## 2014-04-21 MED ORDER — EPOETIN ALFA 10000 UNIT/ML IJ SOLN
5000.0000 [IU] | INTRAMUSCULAR | Status: DC
  Administered 2014-04-21: 5000 [IU] via SUBCUTANEOUS

## 2014-04-21 MED ORDER — EPOETIN ALFA 10000 UNIT/ML IJ SOLN
INTRAMUSCULAR | Status: AC
  Filled 2014-04-21: qty 1

## 2014-04-22 LAB — POCT HEMOGLOBIN-HEMACUE: Hemoglobin: 9.5 g/dL — ABNORMAL LOW (ref 12.0–15.0)

## 2014-04-28 ENCOUNTER — Encounter (HOSPITAL_COMMUNITY): Payer: Medicaid Other

## 2014-04-28 DIAGNOSIS — E13621 Other specified diabetes mellitus with foot ulcer: Secondary | ICD-10-CM | POA: Diagnosis not present

## 2014-04-29 NOTE — Progress Notes (Signed)
[facility]  NAME:  TIFFNAY, SAXENA                 ACCOUNT NO.:  MEDICAL RECORD NO.:  [id number]      DATE OF BIRTH:  [birth year]  PHYSICIAN:  Christin Fudge, MD        VISIT DATE:  04/28/2014                                  OFFICE VISIT   OFFICE VISIT NOTE  HISTORY OF PRESENT ILLNESS:  This pleasant 44 year old patient was known to be a diabetic, has had a left plantar ulcer which is a Wagner grade 2 for approximately 2 months.  SUBJECTIVE:  The patient says she is feeling much better and has been offloading her foot by using an off market piece of foam which she is cutting out the cup in such a way so as to use it under her shoe and she feels this has been very beneficial.  Other than that, she has no specific complaints.  She does admit that her sugar has not been well controlled.  OBJECTIVE:  On objective findings, she has a clean ulcer base with a callus surrounding which needs debridement and she is agreeable about this.  PROCEDURE NOTE AND FINDINGS:  On the left plantar aspect of her foot, the patient has an ulcer which is 0.7 x 1.0 x 0.4 cm in size with clean base, but surrounding it is a significant callus.  Using lidocaine which has been applied with a 4% ointment, I have taken the #15 blade and forceps and sharply debrided the callus around the ulcer down to the skin.  The actual ulcer was curettaged with a blade and superficial debris and biofilm was removed.  There was minimal bleeding and a sliver nitrate stick was used to get hemostasis.  ASSESSMENT AND PLAN:  The patient does need a total contact cast, but due to her activity and the fact that she finds it restricts her movement, she does not want to get it done.  Instead of that, she agrees to use the foam padding which she is cutting appropriately and using it for a dressing, so that she does an offloading in a way.  The plan would be to continue a silver alginate dressing with the  application of a foam padding to offload her left foot.  She will come back and see [place] next week.  All questions are answered and I have encouraged her to take care of her diabetes.          ______________________________ Christin Fudge, MD     EB/MEDQ  D:  04/28/2014  T:  04/29/2014  Job:  [id number]  cc:   Wound Care office

## 2014-04-30 ENCOUNTER — Encounter (HOSPITAL_COMMUNITY)
Admission: RE | Admit: 2014-04-30 | Discharge: 2014-04-30 | Disposition: A | Discharge status: Home / Self Care | Payer: Medicaid Other | Source: Ambulatory Visit | Attending: Nephrology | Admitting: Nephrology

## 2014-04-30 DIAGNOSIS — N184 Chronic kidney disease, stage 4 (severe): Secondary | ICD-10-CM | POA: Diagnosis not present

## 2014-04-30 LAB — POCT HEMOGLOBIN-HEMACUE: Hemoglobin: 8.7 g/dL — ABNORMAL LOW (ref 12.0–15.0)

## 2014-04-30 MED ORDER — EPOETIN ALFA 10000 UNIT/ML IJ SOLN
INTRAMUSCULAR | Status: AC
  Filled 2014-04-30: qty 1

## 2014-04-30 MED ORDER — EPOETIN ALFA 10000 UNIT/ML IJ SOLN
5000.0000 [IU] | INTRAMUSCULAR | Status: DC
  Administered 2014-04-30: 5000 [IU] via SUBCUTANEOUS

## 2014-05-05 ENCOUNTER — Other Ambulatory Visit: Payer: Self-pay | Admitting: Family Medicine

## 2014-05-05 ENCOUNTER — Encounter (HOSPITAL_BASED_OUTPATIENT_CLINIC_OR_DEPARTMENT_OTHER): Payer: Medicaid Other | Attending: Surgery

## 2014-05-05 DIAGNOSIS — L97511 Non-pressure chronic ulcer of other part of right foot limited to breakdown of skin: Secondary | ICD-10-CM | POA: Diagnosis not present

## 2014-05-05 DIAGNOSIS — E11621 Type 2 diabetes mellitus with foot ulcer: Secondary | ICD-10-CM | POA: Insufficient documentation

## 2014-05-05 DIAGNOSIS — L97522 Non-pressure chronic ulcer of other part of left foot with fat layer exposed: Secondary | ICD-10-CM | POA: Diagnosis not present

## 2014-05-05 DIAGNOSIS — N6320 Unspecified lump in the left breast, unspecified quadrant: Secondary | ICD-10-CM

## 2014-05-05 LAB — HM DIABETES FOOT EXAM

## 2014-05-06 NOTE — H&P (Signed)
NAME:  [name], [name] NO.:  [id number]  MEDICAL RECORD NO.:  [id number]  LOCATION:  FOOT                         FACILITY:  [facility]  PHYSICIAN:  Christin Fudge, MD       DATE OF BIRTH:  1971-01-14  DATE OF ADMISSION:  05/05/2014 DATE OF DISCHARGE:                             HISTORY & PHYSICAL   CHIEF COMPLAINT:  This 44 year old patient who is known to be a diabetic has a left plantar ulcer which is a Wagner grade 2 and she has had it for about 2-1/2 months.  Subjectively, the patient feels better and has been offloading the foot by using a piece of foam which she is cutting appropriately.  She is quite ambulant and has quite an active life.  On objective findings, she has a clean ulcer base with a callus which is much better than what it was last week.  There is some biofilm and slough on the side of the wound edge.  PROCEDURE NOTE AND FINDINGS:  On the left plantar aspect of her foot, the size of the ulcer was 0.8 x 1.1 and the depth was 0.6 cm.  The ulcer was anesthetized with lidocaine 4% ointment and an appropriate time-out was done and using a curette I was able to sharply debride the base of the ulcer and a subcutaneous debridement was done including the edges of the skin.  There was minimal bleeding which was controlled with pressure.  ASSESSMENT AND PLAN:  As noted earlier, the patient is not willing to have a total contact cast, and I have agreed to continue offloading with the foam pad she has been using.  Her dressing will include a silver nitrate packing with appropriate foam, and I have advised her to continue trying to give this foot appropriate rest as much as possible without putting too much of pressure on it.  She is agreeable and come back and see [place] next week.          ______________________________ Christin Fudge, MD     EB/MEDQ  D:  05/05/2014  T:  05/06/2014  Job:  [id number]

## 2014-05-07 ENCOUNTER — Encounter (HOSPITAL_COMMUNITY)
Admission: RE | Admit: 2014-05-07 | Discharge: 2014-05-07 | Disposition: A | Discharge status: Home / Self Care | Payer: Medicaid Other | Source: Ambulatory Visit | Attending: Nephrology | Admitting: Nephrology

## 2014-05-07 DIAGNOSIS — D631 Anemia in chronic kidney disease: Secondary | ICD-10-CM | POA: Insufficient documentation

## 2014-05-07 DIAGNOSIS — N184 Chronic kidney disease, stage 4 (severe): Secondary | ICD-10-CM | POA: Diagnosis present

## 2014-05-07 LAB — POCT HEMOGLOBIN-HEMACUE: HEMOGLOBIN: 9.3 g/dL — AB (ref 12.0–15.0)

## 2014-05-07 LAB — IRON AND TIBC
Iron: 58 ug/dL (ref 42–145)
Saturation Ratios: 18 % — ABNORMAL LOW (ref 20–55)
TIBC: 318 ug/dL (ref 250–470)
UIBC: 260 ug/dL (ref 125–400)

## 2014-05-07 MED ORDER — EPOETIN ALFA 10000 UNIT/ML IJ SOLN
5000.0000 [IU] | INTRAMUSCULAR | Status: DC
  Administered 2014-05-07: 5000 [IU] via SUBCUTANEOUS

## 2014-05-07 MED ORDER — EPOETIN ALFA 10000 UNIT/ML IJ SOLN
INTRAMUSCULAR | Status: AC
  Filled 2014-05-07: qty 1

## 2014-05-08 LAB — FERRITIN: FERRITIN: 13 ng/mL (ref 10–291)

## 2014-05-11 ENCOUNTER — Other Ambulatory Visit: Payer: Medicaid Other

## 2014-05-19 ENCOUNTER — Other Ambulatory Visit (HOSPITAL_COMMUNITY): Payer: Self-pay | Admitting: Surgery

## 2014-05-19 ENCOUNTER — Other Ambulatory Visit (HOSPITAL_COMMUNITY): Payer: Self-pay | Admitting: *Deleted

## 2014-05-19 DIAGNOSIS — E11621 Type 2 diabetes mellitus with foot ulcer: Secondary | ICD-10-CM | POA: Diagnosis not present

## 2014-05-19 DIAGNOSIS — B999 Unspecified infectious disease: Secondary | ICD-10-CM

## 2014-05-19 DIAGNOSIS — R52 Pain, unspecified: Secondary | ICD-10-CM

## 2014-05-20 ENCOUNTER — Inpatient Hospital Stay (HOSPITAL_COMMUNITY): Admission: RE | Admit: 2014-05-20 | Payer: Medicaid Other | Source: Ambulatory Visit

## 2014-05-20 NOTE — H&P (Signed)
NAME:  [name], [name] NO.:  [id number]  MEDICAL RECORD NO.:  [id number]  LOCATION:  FOOT                         FACILITY:  [facility]  PHYSICIAN:  Christin Fudge, MD       DATE OF BIRTH:  03/27/1971  DATE OF ADMISSION:  05/05/2014 DATE OF DISCHARGE:                             HISTORY & PHYSICAL   This 44 year old patient who was away on vacation for the last 2 weeks, comes to see [place] after she was last seen here on May 05, 2014.  We have been seeing her for a left plantar aspect of the foot callus and ulceration which she has had for about 3 months and it is a Wagner grade 2 ulceration.  She says she now noticed something on the right forefoot and this is new.  Other than that, she does not seem to have any issues with any pain, discharge, or fever.  She did, however, mention that her blood sugar this morning was 500 and she believes that it may be due to her machine not working well.  PHYSICAL EXAMINATION:  On clinical examination, she is awake and alert, laying comfortably in bed.  Vital signs are stable and she has a height of 5 feet 7 inches, weight of 135 pounds, temperature 98.2, pulse is 86, blood pressure is 156/79 and as noted her blood glucose measured this morning on her machine was 500.  On examination of the right lower extremity, the new area on the plantar aspect of her forefoot in the region of the 1st metatarsal has a blister, and there may be some superficial necrosis.  This needs sharp debridement.  On her left plantar aspect, she has an area which looks much worse than before and this area has a lot of necrotic debris at the depth.  This also needs sharp debridement.  PROCEDURE NOTE:  Under an appropriate time out and local anesthesia application of 4% lidocaine, I have given attention 1st to the right foot plantar aspect where the new blister was present.  This was sharply debrided and it is superficial, dermis under it is not ulcerated.   The area debrided was 2.3 x 0.9 x 0.1.  This does not have any odor or any other problem.  The attention was then given to the left plantar aspect of her left foot and this post debridement area was 1.9 x 2.0 x 1.3 depth.  The actual area debrided was deep down which is a subcutaneous debridement, and I have removed a lot of necrotic subcutaneous tissue. The edges of the skin and callus also trimmed.  There is no active bleeding.  After washing this wound with saline, I have taken cultures from the depths of the wound.  ASSESSMENT: 1. Left plantar aspect of foot Wagner grade 2 ulceration which has     been sharply debrided and cultured. 2. New superficial ulceration on the forefoot on the right plantar     aspect which is a Wagner grade 1. 3. Diabetes mellitus.  PLAN AND RECOMMENDATION:  To the right foot, I have recommended applying some silver alginate and applying some padding to appropriately offload this foot.  As far as the left plantar aspect,  I am advising her to apply Santyl in the depths and pack it appropriately and then offload the foot.  Again, she uses a piece of foam which she has cut and using over this area.  I have addressed the need to repeat her blood sugar and either see her endocrinologist or the ER as soon as possible if her sugars are running high.  I have also recommended an MRI of the left foot.  I understand the patient was out on vacation and from what I gather she has not been offloading her foot in an appropriate manner.  She continues to be noncompliant and is rather indifferent to the fact that her sugar is so high. She does not want to go to the ER nor does she want to see a primary care doctor or endocrinologist.  I have again stressed with her and advised her to get a blood sugar appropriately checked.  She is agreeable about getting the MRI and the local care.  She will see [place] back next week.          ______________________________ Christin Fudge, MD     EB/MEDQ  D:  05/19/2014  T:  05/20/2014  Job:  [id number]  cc:   Wound Care Office

## 2014-05-25 ENCOUNTER — Ambulatory Visit
Admission: RE | Admit: 2014-05-25 | Discharge: 2014-05-25 | Disposition: A | Discharge status: Home / Self Care | Payer: Medicaid Other | Source: Ambulatory Visit | Attending: Family Medicine | Admitting: Family Medicine

## 2014-05-25 ENCOUNTER — Telehealth: Payer: Self-pay | Admitting: Endocrinology

## 2014-05-25 ENCOUNTER — Encounter (INDEPENDENT_AMBULATORY_CARE_PROVIDER_SITE_OTHER): Payer: Self-pay

## 2014-05-25 DIAGNOSIS — N6320 Unspecified lump in the left breast, unspecified quadrant: Secondary | ICD-10-CM

## 2014-05-25 NOTE — Telephone Encounter (Signed)
Pt needs the paperwork for [employer] sent back so they will pay for her diabetic supplies. She is going on vacation next week and need the supplies before she leaves.

## 2014-05-26 ENCOUNTER — Other Ambulatory Visit (HOSPITAL_COMMUNITY): Payer: Self-pay | Admitting: Surgery

## 2014-05-26 ENCOUNTER — Ambulatory Visit (HOSPITAL_COMMUNITY)
Admission: RE | Admit: 2014-05-26 | Discharge: 2014-05-26 | Disposition: A | Discharge status: Home / Self Care | Payer: Medicaid Other | Source: Ambulatory Visit | Attending: Surgery | Admitting: Surgery

## 2014-05-26 DIAGNOSIS — L97521 Non-pressure chronic ulcer of other part of left foot limited to breakdown of skin: Secondary | ICD-10-CM | POA: Diagnosis present

## 2014-05-26 DIAGNOSIS — M869 Osteomyelitis, unspecified: Secondary | ICD-10-CM

## 2014-05-26 DIAGNOSIS — E119 Type 2 diabetes mellitus without complications: Secondary | ICD-10-CM | POA: Insufficient documentation

## 2014-05-26 DIAGNOSIS — E11621 Type 2 diabetes mellitus with foot ulcer: Secondary | ICD-10-CM | POA: Diagnosis not present

## 2014-05-26 LAB — GLUCOSE, CAPILLARY: GLUCOSE-CAPILLARY: 85 mg/dL (ref 70–99)

## 2014-05-27 ENCOUNTER — Encounter: Payer: Self-pay | Admitting: Endocrinology

## 2014-05-27 ENCOUNTER — Ambulatory Visit (INDEPENDENT_AMBULATORY_CARE_PROVIDER_SITE_OTHER): Payer: Medicaid Other | Admitting: Endocrinology

## 2014-05-27 ENCOUNTER — Ambulatory Visit (HOSPITAL_COMMUNITY): Payer: Medicaid Other

## 2014-05-27 VITALS — BP 130/78 | HR 87 | Temp 98.5°F | Ht 68.0 in | Wt 133.0 lb

## 2014-05-27 DIAGNOSIS — E1041 Type 1 diabetes mellitus with diabetic mononeuropathy: Secondary | ICD-10-CM

## 2014-05-27 DIAGNOSIS — IMO0002 Reserved for concepts with insufficient information to code with codable children: Secondary | ICD-10-CM

## 2014-05-27 DIAGNOSIS — N183 Chronic kidney disease, stage 3 (moderate): Secondary | ICD-10-CM

## 2014-05-27 DIAGNOSIS — E1065 Type 1 diabetes mellitus with hyperglycemia: Principal | ICD-10-CM

## 2014-05-27 DIAGNOSIS — E1121 Type 2 diabetes mellitus with diabetic nephropathy: Secondary | ICD-10-CM

## 2014-05-27 DIAGNOSIS — E1049 Type 1 diabetes mellitus with other diabetic neurological complication: Secondary | ICD-10-CM

## 2014-05-27 MED ORDER — LOSARTAN POTASSIUM 25 MG PO TABS: 25.0000 mg | ORAL_TABLET | Freq: Every day | ORAL | Status: DC

## 2014-05-27 NOTE — Progress Notes (Signed)
Patient ID: [name], female   DOB: 1970-05-03, 44 y.o.   MRN: [id number]   Reason for Appointment: Insulin pump follow-up   History of Present Illness   Diagnosis: Type 1 DIABETES MELITUS  Previous history: She has had long-standing poorly controlled diabetes and typically has poor compliance with self care measures despite periodic diabetes education and periodic followup  INSULIN PUMP regimen:  Basal rate: Midnight = 0.325, 9 AM = 0.4, 12 noon = 0.5,5 PM = 0.6. Total basal insulin 11units daily  Carb coverage 1: 18 g and correction 1: 90 with target 150, active insulin 4 hours         Recent history:  She has again been noncompliant with her follow-up. She came in the day because she was running out of supplies for her insulin pump and also needed prescription for diabetic shoes Her blood sugars are still mostly elevated recently and she has difficulty with compliance in following instructions for proper self-care She does have significant fear of hypoglycemia although she has not had any recently Her glucose is averaging 258 compared to 310 on her last visit for the last 2 weeks  A1c has not been done recently  Current blood sugar results from pump download:  PREMEAL  overnight   8-10 AM  11 AM-2PM  5 PM-8PM   11 PM-1AM   Glucose range: 60-400+  142-400+  139-338  55-321 55-400+   Mean/median:  350 250 200 188   Current blood sugar patterns:  Fasting glucose has been mostly high with onlyone reading below 200  Blood sugars are slightly better midday but somewhat variable and not checked consistently  Blood sugars sometimes improve after taking a correction bolus but frequently do not  She has had recently 2 episodes of low blood sugar once when she was having fever and vomiting another during the night after repeated boluses for meals.  Blood sugars are overall relatively lower around 5-9 PM especially recently  Most of the blood sugars late at night are usually  high   She is checking her blood sugars on average 3.6 times a day which is more than before   Current problems identified:  She is having high fasting readings consistently  She is still afraid of hypoglycemia and is resisting increasing her basal rate and correction boluses  She may sometimes eating unbalanced meals and snacks but is trying to cut back on drinks with sugar  May not again bolus right before eating but his usually trying to count her carbohydrates for most of her meals  Meals: eating at variable times and also having snacks periodically.  Her daily carbohydrate intake is quite variable, sometimes as high as 334 g; not clear if she is entering exact amount of carbohydrates she is eating     Physical activity: exercise: none         Dietician visit: Most recent:?          Complications: are: Neuropathy, nephropathy, diabetic foot ulcer      Wt Readings from Last 3 Encounters:  05/27/14 133 lb (60.328 kg)  04/16/14 130 lb (58.968 kg)  04/06/14 135 lb (61.236 kg)   Lab Results  Component Value Date   HGBA1C 12.4* 01/06/2014   HGBA1C 11.8* 07/31/2013   HGBA1C 11.3* 04/08/2013   Lab Results  Component Value Date   MICROALBUR 108.7* 04/08/2013   LDLCALC 128* 07/31/2013   CREATININE 1.50* 03/19/2014        Medication List  This list is accurate as of: 05/27/14  9:13 PM.  Always use your most recent med list.               albuterol 108 (90 BASE) MCG/ACT inhaler  Commonly known as:  PROVENTIL HFA;VENTOLIN HFA  Inhale 2 puffs into the lungs every 6 (six) hours as needed. S.O.B.     calcitRIOL 0.25 MCG capsule  Commonly known as:  ROCALTROL  Take 0.25 mcg by mouth.     ferrous sulfate 325 (65 FE) MG tablet  Take 325 mg by mouth.     insulin aspart 100 UNIT/ML injection  Commonly known as:  novoLOG  Use max 65 units per day with pump     losartan 25 MG tablet  Commonly known as:  COZAAR  Take 1 tablet (25 mg total) by mouth daily.      mupirocin ointment 2 %  Commonly known as:  BACTROBAN     pravastatin 40 MG tablet  Commonly known as:  PRAVACHOL  Take 0.5 tablets (20 mg total) by mouth daily.     sodium bicarbonate 650 MG tablet  Take 1 tablet (650 mg total) by mouth 3 (three) times daily.     valACYclovir 500 MG tablet  Commonly known as:  VALTREX  Take 500 mg by mouth.        Allergies:  Allergies  Allergen Reactions  . Ciprofloxacin Itching  . Cleocin [Clindamycin Hcl]     Diarrhea     Past Medical History  Diagnosis Date  . Diabetes mellitus   . Neuropathy in diabetes   . Hypertension   . Hypercholesteremia   . H/O seasonal allergies   . Anemia   . Left foot infection     Past Surgical History  Procedure Laterality Date  . Cesarean section    . Eye surgery    . Hemrrhoidectomy    . Peripherally inserted central catheter insertion      No family history on file.  Social History:  reports that she quit smoking about 6 years ago. She has never used smokeless tobacco. She reports that she drinks about 0.5 oz of alcohol per week. She reports that she does not use illicit drugs.  Review of Systems:  Hypertension:  Her lisinopril had been stopped because of hyperkalemia and higher creatinine levels.  She was taking losartan but she stopped this because of fear of side effects that she read on the Internet.  She has discussed with nephrologist  Lipids: Has been  prescribed pravastatin but she does not take this, she wants to avoid taking medications  Lab Results  Component Value Date   CHOL 158 01/06/2014   HDL 24.00* 01/06/2014   LDLCALC 128* 07/31/2013   LDLDIRECT 86.2 01/06/2014   TRIG 208.0* 01/06/2014   CHOLHDL 7 01/06/2014     She has been treated by nephrologist for anemia  Lab Results  Component Value Date   WBC 9.3 03/19/2014   HGB 9.3* 05/07/2014   HCT 29.1* 03/19/2014   MCV 80.2 03/19/2014   PLT 378 03/19/2014   She has recently seen the wound clinic for bilateral  foot ulcers   Examination:   BP 130/78 mmHg  Pulse 87  Temp(Src) 98.5 F (36.9 C) (Oral)  Ht 5\' 8"  (1.727 m)  Wt 133 lb (60.328 kg)  BMI 20.23 kg/m2  SpO2 95%  LMP 05/24/2014  Body mass index is 20.23 kg/(m^2).   No significant leg edema present Feet not examined today  as she has bandages on both foot ulcers  ASSESSMENT/ PLAN:   Diabetes type 1 with poor control and multiple complications    Blood glucose levels  are still poorly controlled  See history of present illness for details of her blood sugar patterns, current management and problems identified. She has not had any significant hypoglycemia and appears to be a little more motivated to improve her control because of fear of end-stage kidney disease after talking to the nephrologist. Explained to her that her high fasting readings are related to inadequate basal insulin at least in the early morning She can do better with her compliance with consistent meals, balanced food intake with some protein as well as bolusing right at mealtime consistently She still has very labile blood sugars, partly related to the nature of her disease but also from inconsistent monitoring, inconsistent bolusing and intercurrent medical issues   DIABETIC nephropathy: Discussed in detail the need for an ARB drug because of her proteinuria and to stall any progression of her nephropathy especially since her diabetes has been very difficult to control and will not improve to ideal levels.. Reassured her on the safety of the medication especially low doses. She was explained that the benefits outweigh any possible side effects which are not very relevant to her  Diabetic foot ulcer: She will be given diabetic shoes.  Stressed the importance of better diabetes control  PLAN:  Basal rate to be increased to 0.4 from 6 AM-9 AM Reduce basal rate to 0.55 from 5 PM-10 PM Consider increasing correction factor if blood sugars not consistently  controlled She will try not to run out of insulin and keep her cartridge consistently filled More consistent follow-up  More frequent glucose monitoring She needs to bolus appropriately and discussed timing of boluses and adjustment based on carbohydrate intake Have protein with every meal Followup in 6 weeks Restart losartan 25 mg and adjust dose as needed based on blood pressure, renal function and potassium  Patient Instructions  Restart Losartan   Counseling time over 50% of today's 25 minute visit  Torrance Frech 05/27/2014, 9:13 PM   Addendum: Potassium 5.4, need to stop lisinopril and go back to 25 mg losartan

## 2014-05-27 NOTE — Telephone Encounter (Signed)
Pt advised that paper work has been faxed.

## 2014-05-27 NOTE — Patient Instructions (Signed)
Re-start Losartan 

## 2014-05-28 LAB — BASIC METABOLIC PANEL
BUN: 33 mg/dL — ABNORMAL HIGH (ref 6–23)
CALCIUM: 8.6 mg/dL (ref 8.4–10.5)
CHLORIDE: 109 meq/L (ref 96–112)
CO2: 17 mEq/L — ABNORMAL LOW (ref 19–32)
CREATININE: 2.01 mg/dL — AB (ref 0.40–1.20)
GFR: 28.8 mL/min — ABNORMAL LOW (ref >60.00)
Glucose, Bld: 313 mg/dL — ABNORMAL HIGH (ref 70–99)
POTASSIUM: 5.9 meq/L — AB (ref 3.5–5.1)
SODIUM: 131 meq/L — AB (ref 135–145)

## 2014-05-28 LAB — HEMOGLOBIN A1C: Hgb A1c MFr Bld: 10 % — ABNORMAL HIGH (ref 4.6–6.5)

## 2014-05-28 NOTE — Progress Notes (Signed)
Quick Note:  Please let patient know that the A1c is 10%, still high. However potassium level is high at 5.9. Not to start losartan but needs to call kidney specialist about treatment and also a low potassium diet. Please fax result to her nephrologist ______

## 2014-06-02 ENCOUNTER — Ambulatory Visit (HOSPITAL_COMMUNITY)
Admission: RE | Admit: 2014-06-02 | Discharge: 2014-06-02 | Disposition: A | Discharge status: Home / Self Care | Payer: Medicaid Other | Source: Ambulatory Visit | Attending: Surgery | Admitting: Surgery

## 2014-06-02 ENCOUNTER — Encounter (HOSPITAL_BASED_OUTPATIENT_CLINIC_OR_DEPARTMENT_OTHER): Payer: Self-pay | Admitting: *Deleted

## 2014-06-02 ENCOUNTER — Other Ambulatory Visit (HOSPITAL_COMMUNITY): Payer: Self-pay | Admitting: Surgery

## 2014-06-02 ENCOUNTER — Inpatient Hospital Stay (HOSPITAL_COMMUNITY)
Admission: EM | Admit: 2014-06-02 | Discharge: 2014-06-18 | DRG: 982 | Disposition: A | Discharge status: Home Health | Payer: Medicaid Other | Attending: Internal Medicine | Admitting: Internal Medicine

## 2014-06-02 ENCOUNTER — Encounter (HOSPITAL_BASED_OUTPATIENT_CLINIC_OR_DEPARTMENT_OTHER): Payer: Medicaid Other | Attending: Surgery

## 2014-06-02 ENCOUNTER — Other Ambulatory Visit (HOSPITAL_COMMUNITY): Payer: Self-pay | Admitting: *Deleted

## 2014-06-02 DIAGNOSIS — I129 Hypertensive chronic kidney disease with stage 1 through stage 4 chronic kidney disease, or unspecified chronic kidney disease: Secondary | ICD-10-CM | POA: Diagnosis present

## 2014-06-02 DIAGNOSIS — L89892 Pressure ulcer of other site, stage 2: Secondary | ICD-10-CM

## 2014-06-02 DIAGNOSIS — E1065 Type 1 diabetes mellitus with hyperglycemia: Secondary | ICD-10-CM | POA: Diagnosis present

## 2014-06-02 DIAGNOSIS — L97411 Non-pressure chronic ulcer of right heel and midfoot limited to breakdown of skin: Secondary | ICD-10-CM | POA: Diagnosis not present

## 2014-06-02 DIAGNOSIS — E1029 Type 1 diabetes mellitus with other diabetic kidney complication: Secondary | ICD-10-CM | POA: Diagnosis present

## 2014-06-02 DIAGNOSIS — L97419 Non-pressure chronic ulcer of right heel and midfoot with unspecified severity: Secondary | ICD-10-CM | POA: Diagnosis not present

## 2014-06-02 DIAGNOSIS — N183 Chronic kidney disease, stage 3 unspecified: Secondary | ICD-10-CM | POA: Diagnosis present

## 2014-06-02 DIAGNOSIS — Z9641 Presence of insulin pump (external) (internal): Secondary | ICD-10-CM | POA: Diagnosis present

## 2014-06-02 DIAGNOSIS — Z87891 Personal history of nicotine dependence: Secondary | ICD-10-CM

## 2014-06-02 DIAGNOSIS — R059 Cough, unspecified: Secondary | ICD-10-CM

## 2014-06-02 DIAGNOSIS — L089 Local infection of the skin and subcutaneous tissue, unspecified: Secondary | ICD-10-CM

## 2014-06-02 DIAGNOSIS — L97422 Non-pressure chronic ulcer of left heel and midfoot with fat layer exposed: Secondary | ICD-10-CM | POA: Insufficient documentation

## 2014-06-02 DIAGNOSIS — E1022 Type 1 diabetes mellitus with diabetic chronic kidney disease: Secondary | ICD-10-CM | POA: Diagnosis present

## 2014-06-02 DIAGNOSIS — E871 Hypo-osmolality and hyponatremia: Secondary | ICD-10-CM | POA: Diagnosis not present

## 2014-06-02 DIAGNOSIS — L03115 Cellulitis of right lower limb: Secondary | ICD-10-CM | POA: Diagnosis not present

## 2014-06-02 DIAGNOSIS — IMO0002 Reserved for concepts with insufficient information to code with codable children: Secondary | ICD-10-CM | POA: Diagnosis present

## 2014-06-02 DIAGNOSIS — E10621 Type 1 diabetes mellitus with foot ulcer: Principal | ICD-10-CM | POA: Diagnosis present

## 2014-06-02 DIAGNOSIS — N170 Acute kidney failure with tubular necrosis: Secondary | ICD-10-CM | POA: Diagnosis not present

## 2014-06-02 DIAGNOSIS — D631 Anemia in chronic kidney disease: Secondary | ICD-10-CM | POA: Diagnosis present

## 2014-06-02 DIAGNOSIS — M869 Osteomyelitis, unspecified: Secondary | ICD-10-CM

## 2014-06-02 DIAGNOSIS — E10628 Type 1 diabetes mellitus with other skin complications: Secondary | ICD-10-CM

## 2014-06-02 DIAGNOSIS — Z794 Long term (current) use of insulin: Secondary | ICD-10-CM

## 2014-06-02 DIAGNOSIS — L97519 Non-pressure chronic ulcer of other part of right foot with unspecified severity: Secondary | ICD-10-CM

## 2014-06-02 DIAGNOSIS — A419 Sepsis, unspecified organism: Secondary | ICD-10-CM | POA: Diagnosis present

## 2014-06-02 DIAGNOSIS — R05 Cough: Secondary | ICD-10-CM

## 2014-06-02 DIAGNOSIS — N179 Acute kidney failure, unspecified: Secondary | ICD-10-CM

## 2014-06-02 DIAGNOSIS — D509 Iron deficiency anemia, unspecified: Secondary | ICD-10-CM | POA: Diagnosis present

## 2014-06-02 DIAGNOSIS — E11621 Type 2 diabetes mellitus with foot ulcer: Secondary | ICD-10-CM

## 2014-06-02 DIAGNOSIS — E1049 Type 1 diabetes mellitus with other diabetic neurological complication: Secondary | ICD-10-CM | POA: Diagnosis present

## 2014-06-02 DIAGNOSIS — N189 Chronic kidney disease, unspecified: Secondary | ICD-10-CM

## 2014-06-02 DIAGNOSIS — E780 Pure hypercholesterolemia: Secondary | ICD-10-CM | POA: Diagnosis present

## 2014-06-02 DIAGNOSIS — E1040 Type 1 diabetes mellitus with diabetic neuropathy, unspecified: Secondary | ICD-10-CM | POA: Diagnosis present

## 2014-06-02 HISTORY — DX: Non-pressure chronic ulcer of other part of right foot with unspecified severity: L97.519

## 2014-06-02 HISTORY — DX: Type 2 diabetes mellitus with foot ulcer: E11.621

## 2014-06-02 HISTORY — DX: Cellulitis of right lower limb: L03.115

## 2014-06-02 LAB — CBC
HEMATOCRIT: 29.6 % — AB (ref 36.0–46.0)
Hemoglobin: 9.3 g/dL — ABNORMAL LOW (ref 12.0–15.0)
MCH: 25 pg — ABNORMAL LOW (ref 26.0–34.0)
MCHC: 31.4 g/dL (ref 30.0–36.0)
MCV: 79.6 fL (ref 78.0–100.0)
PLATELETS: 552 10*3/uL — AB (ref 150–400)
RBC: 3.72 MIL/uL — ABNORMAL LOW (ref 3.87–5.11)
RDW: 16.2 % — ABNORMAL HIGH (ref 11.5–15.5)
WBC: 16 10*3/uL — AB (ref 4.0–10.5)

## 2014-06-02 LAB — BASIC METABOLIC PANEL
Anion gap: 5 (ref 5–15)
BUN: 31 mg/dL — AB (ref 6–23)
CALCIUM: 8 mg/dL — AB (ref 8.4–10.5)
CO2: 14 mmol/L — AB (ref 19–32)
Chloride: 107 mmol/L (ref 96–112)
Creatinine, Ser: 1.96 mg/dL — ABNORMAL HIGH (ref 0.50–1.10)
GFR calc Af Amer: 35 mL/min — ABNORMAL LOW (ref >90)
GFR calc non Af Amer: 30 mL/min — ABNORMAL LOW (ref >90)
GLUCOSE: 237 mg/dL — AB (ref 70–99)
Potassium: 5.3 mmol/L — ABNORMAL HIGH (ref 3.5–5.1)
Sodium: 126 mmol/L — ABNORMAL LOW (ref 135–145)

## 2014-06-02 LAB — CBG MONITORING, ED: Glucose-Capillary: 156 mg/dL — ABNORMAL HIGH (ref 70–99)

## 2014-06-02 LAB — I-STAT CG4 LACTIC ACID, ED: Lactic Acid, Venous: 0.48 mmol/L — ABNORMAL LOW (ref 0.5–2.0)

## 2014-06-02 MED ORDER — INSULIN PUMP
Freq: Three times a day (TID) | SUBCUTANEOUS | Status: DC
  Administered 2014-06-03: 2 via SUBCUTANEOUS
  Administered 2014-06-05: 1.26 via SUBCUTANEOUS
  Administered 2014-06-06: 3 via SUBCUTANEOUS
  Administered 2014-06-06: 4 via SUBCUTANEOUS
  Administered 2014-06-07: 3 via SUBCUTANEOUS
  Administered 2014-06-07: 02:00:00 via SUBCUTANEOUS
  Administered 2014-06-09: 2.4 via SUBCUTANEOUS
  Administered 2014-06-09: 3 via SUBCUTANEOUS
  Administered 2014-06-09 – 2014-06-11 (×5): via SUBCUTANEOUS
  Administered 2014-06-11: 1 via SUBCUTANEOUS
  Administered 2014-06-11: 22:00:00 via SUBCUTANEOUS
  Administered 2014-06-12: 4 via SUBCUTANEOUS
  Administered 2014-06-12 (×3): via SUBCUTANEOUS
  Administered 2014-06-13 (×3): 4 via SUBCUTANEOUS
  Administered 2014-06-14: 3 via SUBCUTANEOUS
  Administered 2014-06-15 – 2014-06-16 (×4): via SUBCUTANEOUS
  Administered 2014-06-16: 2.5 via SUBCUTANEOUS
  Administered 2014-06-17 – 2014-06-18 (×5): via SUBCUTANEOUS
  Administered 2014-06-18 (×3): 2.75 via SUBCUTANEOUS
  Filled 2014-06-02: qty 1

## 2014-06-02 MED ORDER — PRAVASTATIN SODIUM 20 MG PO TABS
20.0000 mg | ORAL_TABLET | Freq: Every day | ORAL | Status: DC
  Administered 2014-06-03 – 2014-06-18 (×14): 20 mg via ORAL
  Filled 2014-06-02 (×16): qty 1

## 2014-06-02 MED ORDER — VALACYCLOVIR HCL 500 MG PO TABS
500.0000 mg | ORAL_TABLET | Freq: Every day | ORAL | Status: DC
  Administered 2014-06-03 – 2014-06-07 (×5): 500 mg via ORAL
  Filled 2014-06-02 (×6): qty 1

## 2014-06-02 MED ORDER — CALCITRIOL 0.25 MCG PO CAPS
0.2500 ug | ORAL_CAPSULE | Freq: Every day | ORAL | Status: DC
  Administered 2014-06-03 – 2014-06-18 (×14): 0.25 ug via ORAL
  Filled 2014-06-02 (×16): qty 1

## 2014-06-02 MED ORDER — MORPHINE SULFATE 4 MG/ML IJ SOLN
4.0000 mg | Freq: Once | INTRAMUSCULAR | Status: AC
Start: 2014-06-02 — End: 2014-06-02
  Administered 2014-06-02: 4 mg via INTRAVENOUS
  Filled 2014-06-02: qty 1

## 2014-06-02 MED ORDER — ACETAMINOPHEN 325 MG PO TABS
650.0000 mg | ORAL_TABLET | Freq: Once | ORAL | Status: AC
  Administered 2014-06-02: 650 mg via ORAL
  Filled 2014-06-02: qty 2

## 2014-06-02 MED ORDER — ALBUTEROL SULFATE (2.5 MG/3ML) 0.083% IN NEBU: 3.0000 mL | INHALATION_SOLUTION | Freq: Four times a day (QID) | RESPIRATORY_TRACT | Status: DC | PRN

## 2014-06-02 MED ORDER — LOSARTAN POTASSIUM 25 MG PO TABS: 25.0000 mg | ORAL_TABLET | Freq: Every day | ORAL | Status: DC

## 2014-06-02 MED ORDER — SODIUM BICARBONATE 650 MG PO TABS
650.0000 mg | ORAL_TABLET | Freq: Three times a day (TID) | ORAL | Status: DC
Start: 2014-06-02 — End: 2014-06-08
  Administered 2014-06-03 – 2014-06-07 (×16): 650 mg via ORAL
  Filled 2014-06-02 (×19): qty 1

## 2014-06-02 MED ORDER — VANCOMYCIN HCL IN DEXTROSE 1-5 GM/200ML-% IV SOLN
1000.0000 mg | Freq: Once | INTRAVENOUS | Status: AC
  Administered 2014-06-02: 1000 mg via INTRAVENOUS
  Filled 2014-06-02: qty 200

## 2014-06-02 NOTE — ED Provider Notes (Signed)
CSN: [id number]     Arrival date & time 06/02/14  1741 History   First MD Initiated Contact with Patient 06/02/14 1811     Chief Complaint  Patient presents with  . Foot Pain     (Consider location/radiation/quality/duration/timing/severity/associated sxs/prior Treatment) HPI Comments: 44 year old female past medical history diabetes, hypertension, hypercholesterolemia, anemia and osteomyelitis of left foot presenting with worsening blister to the bottom of her right foot 3 weeks. She is being treated for diabetic ulcer on the bottom of her foot by the wound clinic, was seen there today due to worsening pain. She had an x-ray at [facility] long earlier today which was negative. She was advised to go to the emergency department if her symptoms worsened. Pain is sharp and stabbing, rated 7/10, worse with pressure and walking, unrelieved by hydrocodone. Over the past 2 weeks, reports she had a fever of 101 or 102 intermittently. Blood sugar this morning was 220, however states she drinks a lot of juice prior to check her blood sugar. Otherwise, she reports that her blood sugar has been normal.  Patient is a 44 y.o. female presenting with lower extremity pain. The history is provided by the patient.  Foot Pain Associated symptoms include arthralgias and a fever.    Past Medical History  Diagnosis Date  . Diabetes mellitus   . Neuropathy in diabetes   . Hypertension   . Hypercholesteremia   . H/O seasonal allergies   . Anemia   . Left foot infection    Past Surgical History  Procedure Laterality Date  . Cesarean section    . Eye surgery    . Hemrrhoidectomy    . Peripherally inserted central catheter insertion     No family history on file. History  Substance Use Topics  . Smoking status: Former Smoker    Quit date: 05/16/2008  . Smokeless tobacco: Never Used  . Alcohol Use: 0.5 oz/week    1 drink(s) per week     Comment: Occasional   OB History    No data available     Review  of Systems  Constitutional: Positive for fever.  Musculoskeletal: Positive for arthralgias.  Skin: Positive for color change and wound.  All other systems reviewed and are negative.     Allergies  Ciprofloxacin and Cleocin  Home Medications   Prior to Admission medications   Medication Sig Start Date End Date Taking? Authorizing Provider  sulfamethoxazole-trimethoprim (BACTRIM DS,SEPTRA DS) 800-160 MG per tablet Take 1 tablet by mouth 2 (two) times daily.   Yes Historical Provider, MD  albuterol (PROVENTIL HFA;VENTOLIN HFA) 108 (90 BASE) MCG/ACT inhaler Inhale 2 puffs into the lungs every 6 (six) hours as needed. S.O.B.    Historical Provider, MD  calcitRIOL (ROCALTROL) 0.25 MCG capsule Take 0.25 mcg by mouth.    Historical Provider, MD  ferrous sulfate 325 (65 FE) MG tablet Take 325 mg by mouth. 11/23/13   Historical Provider, MD  insulin aspart (NOVOLOG) 100 UNIT/ML injection Use max 65 units per day with pump 09/02/13   Elayne Snare, MD  losartan (COZAAR) 25 MG tablet Take 1 tablet (25 mg total) by mouth daily. 05/27/14   Elayne Snare, MD  mupirocin ointment (BACTROBAN) 2 %  11/23/13   Historical Provider, MD  pravastatin (PRAVACHOL) 40 MG tablet Take 0.5 tablets (20 mg total) by mouth daily. Patient not taking: Reported on 05/27/2014 08/05/13   Elayne Snare, MD  sodium bicarbonate 650 MG tablet Take 1 tablet (650 mg total) by mouth  3 (three) times daily. 06/15/13   Bonnielee Haff, MD  valACYclovir (VALTREX) 500 MG tablet Take 500 mg by mouth. 06/16/13 06/17/14  Historical Provider, MD   BP 145/69 mmHg  Pulse 98  Temp(Src) 100 F (37.8 C) (Oral)  Resp 16  Ht 5\' 7"  (1.702 m)  Wt 130 lb (58.968 kg)  BMI 20.36 kg/m2  SpO2 99%  LMP 05/21/2014 Physical Exam  Constitutional: She is oriented to person, place, and time. She appears well-developed and well-nourished. No distress.  HENT:  Head: Normocephalic and atraumatic.  Mouth/Throat: Oropharynx is clear and moist.  Eyes: Conjunctivae and EOM  are normal.  Neck: Normal range of motion. Neck supple.  Cardiovascular: Normal rate, regular rhythm and normal heart sounds.   Pulmonary/Chest: Effort normal and breath sounds normal. No respiratory distress.  Musculoskeletal:  Stage II diabetic ulcer on the plantar surface of right foot below 1st MTP with surrounding erythema. Tender. Erythema and swelling to the dorsum of right foot. Tender. +2 DP/PT pulse.  Neurological: She is alert and oriented to person, place, and time. No sensory deficit.  Skin: Skin is warm and dry.  Psychiatric: She has a normal mood and affect. Her behavior is normal.  Nursing note and vitals reviewed.   ED Course  Procedures (including critical care time) Labs Review Labs Reviewed  CBC - Abnormal; Notable for the following:    WBC 16.0 (*)    RBC 3.72 (*)    Hemoglobin 9.3 (*)    HCT 29.6 (*)    MCH 25.0 (*)    RDW 16.2 (*)    Platelets 552 (*)    All other components within normal limits  BASIC METABOLIC PANEL - Abnormal; Notable for the following:    Sodium 126 (*)    Potassium 5.3 (*)    CO2 14 (*)    Glucose, Bld 237 (*)    BUN 31 (*)    Creatinine, Ser 1.96 (*)    Calcium 8.0 (*)    GFR calc non Af Amer 30 (*)    GFR calc Af Amer 35 (*)    All other components within normal limits  I-STAT CG4 LACTIC ACID, ED - Abnormal; Notable for the following:    Lactic Acid, Venous 0.48 (*)    All other components within normal limits  CBG MONITORING, ED - Abnormal; Notable for the following:    Glucose-Capillary 156 (*)    All other components within normal limits  CULTURE, BLOOD (ROUTINE X 2)  CULTURE, BLOOD (ROUTINE X 2)    Imaging Review Dg Foot Complete Right  06/02/2014   CLINICAL DATA:  Subsequent encounter for diffuse right foot pain. Symptoms are persisting for a few days.  EXAM: RIGHT FOOT COMPLETE - 3+ VIEW  COMPARISON:  03/19/2014.  FINDINGS: No evidence for an acute fracture. Deformity of the fourth toe proximal phalanx is compatible  with remote trauma. No worrisome lytic or sclerotic osseous abnormality.  IMPRESSION: No acute bony findings.   Electronically Signed   By: Misty Stanley M.D.   On: 06/02/2014 15:36     EKG Interpretation None      MDM   Final diagnoses:  Cellulitis of right foot  Pressure ulcer of foot, stage 2   NAD. Temp 100, vitals stable. Significant hx of cellulitis, diabetic ulcers, osteomyelitis. Xray earlier in the day obtained by her wound care specialist Dr. Con Memos negative for signs of osteomyelitis. Labs leukocytosis of 16, normal lactate, otherwise at baseline. IV vancomycin started. Will  admit for obs and IV abx. Admission accepted by Dr. Arnoldo Morale, [facility], med-surg bed.  Discussed with attending Dr. Regenia Skeeter who agrees with plan of care.    Carman Ching, PA-C 06/02/14 2111  Sherwood Gambler, MD 06/08/14 [phone]

## 2014-06-02 NOTE — Progress Notes (Signed)
ANTIBIOTIC CONSULT NOTE - INITIAL  Pharmacy Consult for vancomycin Indication: cellulitis  Allergies  Allergen Reactions  . Ciprofloxacin Itching  . Cleocin [Clindamycin Hcl]     Diarrhea     Patient Measurements: Height: 5\' 7"  (170.2 cm) Weight: 130 lb (58.968 kg) IBW/kg (Calculated) : 61.6  Vital Signs: Temp: 98.7 F (37.1 C) (03/02 2305) Temp Source: Oral (03/02 1747) BP: 121/67 mmHg (03/02 2305) Pulse Rate: 80 (03/02 2305)  Labs:  Recent Labs  06/02/14 1900  WBC 16.0*  HGB 9.3*  PLT 552*  CREATININE 1.96*   Estimated Creatinine Clearance: 34.8 mL/min (by C-G formula based on Cr of 1.96).  Medical History: Past Medical History  Diagnosis Date  . Diabetes mellitus   . Neuropathy in diabetes   . Hypertension   . Hypercholesteremia   . H/O seasonal allergies   . Anemia   . Left foot infection     Medications:  Prescriptions prior to admission  Medication Sig Dispense Refill Last Dose  . sulfamethoxazole-trimethoprim (BACTRIM DS,SEPTRA DS) 800-160 MG per tablet Take 1 tablet by mouth 2 (two) times daily.     Marland Kitchen albuterol (PROVENTIL HFA;VENTOLIN HFA) 108 (90 BASE) MCG/ACT inhaler Inhale 2 puffs into the lungs every 6 (six) hours as needed. S.O.B.   Taking  . calcitRIOL (ROCALTROL) 0.25 MCG capsule Take 0.25 mcg by mouth.   Taking  . ferrous sulfate 325 (65 FE) MG tablet Take 325 mg by mouth.   Taking  . insulin aspart (NOVOLOG) 100 UNIT/ML injection Use max 65 units per day with pump 3 vial 3 Taking  . losartan (COZAAR) 25 MG tablet Take 1 tablet (25 mg total) by mouth daily. 30 tablet 2   . mupirocin ointment (BACTROBAN) 2 %    Taking  . pravastatin (PRAVACHOL) 40 MG tablet Take 0.5 tablets (20 mg total) by mouth daily. (Patient not taking: Reported on 05/27/2014) 30 tablet 3 Not Taking  . sodium bicarbonate 650 MG tablet Take 1 tablet (650 mg total) by mouth 3 (three) times daily. 90 tablet 0 Taking  . valACYclovir (VALTREX) 500 MG tablet Take 500 mg by  mouth.   Unknown   Scheduled:  . [START ON 06/03/2014] calcitRIOL  0.25 mcg Oral Daily  . [START ON 06/03/2014] insulin pump   Subcutaneous TID AC, HS, 0200  . [START ON 06/03/2014] losartan  25 mg Oral Daily  . [START ON 06/03/2014] pravastatin  20 mg Oral Daily  . sodium bicarbonate  650 mg Oral TID  . [START ON 06/03/2014] valACYclovir  500 mg Oral Daily     Assessment: 44yo female c/o "blister" on right foot x3wk w/ pain, redness, and intermittent fevers, noted h/o osteo of left foot, Xray by wound care specialist earlier today reportedly negative for signs of osteo, to begin IV ABX for cellulitis.  Goal of Therapy:  Vancomycin trough level 10-15 mcg/ml  Plan:  Rec'd vanc 1g in ED; will continue with vancomycin 500mg  IV Q24H and monitor CBC, Cx, levels prn.  Wynona Neat, PharmD, BCPS  06/02/2014,11:57 PM

## 2014-06-02 NOTE — ED Notes (Signed)
Report given to Emmanuel, RN

## 2014-06-02 NOTE — ED Notes (Signed)
Pt reports that she has been treated for a blister on her right for about 3 week. Pt says that her foot has been painful, red, intermittent fevers.

## 2014-06-02 NOTE — ED Notes (Signed)
Report given to [facility] with [facility]

## 2014-06-03 DIAGNOSIS — L03115 Cellulitis of right lower limb: Secondary | ICD-10-CM

## 2014-06-03 DIAGNOSIS — N189 Chronic kidney disease, unspecified: Secondary | ICD-10-CM

## 2014-06-03 DIAGNOSIS — E10621 Type 1 diabetes mellitus with foot ulcer: Principal | ICD-10-CM

## 2014-06-03 DIAGNOSIS — A419 Sepsis, unspecified organism: Secondary | ICD-10-CM

## 2014-06-03 DIAGNOSIS — E1022 Type 1 diabetes mellitus with diabetic chronic kidney disease: Secondary | ICD-10-CM

## 2014-06-03 DIAGNOSIS — E1041 Type 1 diabetes mellitus with diabetic mononeuropathy: Secondary | ICD-10-CM

## 2014-06-03 DIAGNOSIS — N183 Chronic kidney disease, stage 3 (moderate): Secondary | ICD-10-CM

## 2014-06-03 DIAGNOSIS — L97519 Non-pressure chronic ulcer of other part of right foot with unspecified severity: Secondary | ICD-10-CM

## 2014-06-03 LAB — CBC
HCT: 26.4 % — ABNORMAL LOW (ref 36.0–46.0)
Hemoglobin: 8.4 g/dL — ABNORMAL LOW (ref 12.0–15.0)
MCH: 25 pg — ABNORMAL LOW (ref 26.0–34.0)
MCHC: 31.8 g/dL (ref 30.0–36.0)
MCV: 78.6 fL (ref 78.0–100.0)
Platelets: 514 10*3/uL — ABNORMAL HIGH (ref 150–400)
RBC: 3.36 MIL/uL — ABNORMAL LOW (ref 3.87–5.11)
RDW: 16.6 % — ABNORMAL HIGH (ref 11.5–15.5)
WBC: 8.1 10*3/uL (ref 4.0–10.5)

## 2014-06-03 LAB — BASIC METABOLIC PANEL
ANION GAP: 9 (ref 5–15)
BUN: 32 mg/dL — ABNORMAL HIGH (ref 6–23)
CHLORIDE: 111 mmol/L (ref 96–112)
CO2: 16 mmol/L — AB (ref 19–32)
Calcium: 8.2 mg/dL — ABNORMAL LOW (ref 8.4–10.5)
Creatinine, Ser: 2.07 mg/dL — ABNORMAL HIGH (ref 0.50–1.10)
GFR calc Af Amer: 33 mL/min — ABNORMAL LOW (ref >90)
GFR calc non Af Amer: 28 mL/min — ABNORMAL LOW (ref >90)
Glucose, Bld: 58 mg/dL — ABNORMAL LOW (ref 70–99)
POTASSIUM: 4.4 mmol/L (ref 3.5–5.1)
Sodium: 136 mmol/L (ref 135–145)

## 2014-06-03 LAB — GLUCOSE, CAPILLARY
GLUCOSE-CAPILLARY: 122 mg/dL — AB (ref 70–99)
GLUCOSE-CAPILLARY: 113 mg/dL — AB (ref 70–99)
Glucose-Capillary: 188 mg/dL — ABNORMAL HIGH (ref 70–99)
Glucose-Capillary: 77 mg/dL (ref 70–99)
Glucose-Capillary: 109 mg/dL — ABNORMAL HIGH (ref 70–99)
Glucose-Capillary: 44 mg/dL — CL (ref 70–99)
Glucose-Capillary: 62 mg/dL — ABNORMAL LOW (ref 70–99)
Glucose-Capillary: 73 mg/dL (ref 70–99)

## 2014-06-03 LAB — MRSA PCR SCREENING: MRSA by PCR: NEGATIVE

## 2014-06-03 MED ORDER — DEXTROSE 50 % IV SOLN
INTRAVENOUS | Status: AC
  Filled 2014-06-03: qty 50

## 2014-06-03 MED ORDER — CEFTRIAXONE SODIUM IN DEXTROSE 20 MG/ML IV SOLN
1.0000 g | INTRAVENOUS | Status: DC
  Administered 2014-06-03: 1 g via INTRAVENOUS
  Filled 2014-06-03 (×2): qty 50

## 2014-06-03 MED ORDER — DEXTROSE 50 % IV SOLN
25.0000 mL | Freq: Once | INTRAVENOUS | Status: AC
  Administered 2014-06-03: 25 mL via INTRAVENOUS
  Filled 2014-06-03: qty 50

## 2014-06-03 MED ORDER — VANCOMYCIN HCL 500 MG IV SOLR
500.0000 mg | INTRAVENOUS | Status: DC
  Filled 2014-06-03: qty 500

## 2014-06-03 MED ORDER — HYDROCODONE-ACETAMINOPHEN 5-325 MG PO TABS
1.0000 | ORAL_TABLET | ORAL | Status: DC | PRN
  Administered 2014-06-03 – 2014-06-09 (×12): 2 via ORAL
  Administered 2014-06-11: 1 via ORAL
  Administered 2014-06-12 – 2014-06-16 (×5): 2 via ORAL
  Administered 2014-06-16: 1 via ORAL
  Administered 2014-06-16 – 2014-06-17 (×2): 2 via ORAL
  Filled 2014-06-03 (×5): qty 2
  Filled 2014-06-03: qty 1
  Filled 2014-06-03 (×8): qty 2
  Filled 2014-06-03: qty 1
  Filled 2014-06-03 (×7): qty 2

## 2014-06-03 MED ORDER — METRONIDAZOLE IN NACL 5-0.79 MG/ML-% IV SOLN
500.0000 mg | Freq: Three times a day (TID) | INTRAVENOUS | Status: DC
  Administered 2014-06-03 – 2014-06-04 (×3): 500 mg via INTRAVENOUS
  Filled 2014-06-03 (×5): qty 100

## 2014-06-03 MED ORDER — EPOETIN ALFA 10000 UNIT/ML IJ SOLN
5000.0000 [IU] | Freq: Once | INTRAMUSCULAR | Status: DC
  Filled 2014-06-03: qty 1

## 2014-06-03 MED ORDER — DARBEPOETIN ALFA 25 MCG/0.42ML IJ SOSY
25.0000 ug | PREFILLED_SYRINGE | Freq: Once | INTRAMUSCULAR | Status: AC
  Administered 2014-06-03: 25 ug via SUBCUTANEOUS
  Filled 2014-06-03: qty 0.42

## 2014-06-03 MED ORDER — GABAPENTIN 300 MG PO CAPS
300.0000 mg | ORAL_CAPSULE | Freq: Every day | ORAL | Status: DC
  Administered 2014-06-03 – 2014-06-17 (×15): 300 mg via ORAL
  Filled 2014-06-03 (×17): qty 1

## 2014-06-03 MED ORDER — HEPARIN SODIUM (PORCINE) 5000 UNIT/ML IJ SOLN
5000.0000 [IU] | Freq: Three times a day (TID) | INTRAMUSCULAR | Status: DC
  Administered 2014-06-03 – 2014-06-18 (×35): 5000 [IU] via SUBCUTANEOUS
  Filled 2014-06-03 (×49): qty 1

## 2014-06-03 MED ORDER — GABAPENTIN 600 MG PO TABS
300.0000 mg | ORAL_TABLET | Freq: Every day | ORAL | Status: DC
  Filled 2014-06-03: qty 0.5

## 2014-06-03 NOTE — Progress Notes (Signed)
NP on call paged for low CBG's x 2 At present CBG is 73. Insulin pump on going. Awaiting return call. New order received.

## 2014-06-03 NOTE — Progress Notes (Signed)
Hypoglycemic Event  CBG: 44  Treatment: 15 GM carbohydrate snack  Symptoms: None  Follow-up CBG: Time:2015 CBG Result: 62  Possible Reasons for Event: Inadequate meal intake  Comments/MD notified:    Viviano Simas  Remember to initiate Hypoglycemia Order Set & complete

## 2014-06-03 NOTE — Consult Note (Addendum)
WOC wound consult note Reason for Consult: Consult requested for bilat feet.  Pt is followed by the outpatient wound care center and they recently performed sharp debridement to a chronic full thickness wound on the left foot.  Patient devloped erythremia, edema, and blistering to the right foot this week after walking for long periods of time while on vacation. Wound type: Left plantar foot with full thickness wound, .3X.3X.2cm, red and dry, surrounded by dry callous to wound edges.  No odor or drainage, no discomfort to this wound.   Right plantar foot with previous blister which has ruptured and evolved into partial thickness wound; affected area is 6X2cm and part of this is a bruise under the surface of the skin which has not evolved at this time. Open area is approx 1X1X.1cm, pink and dry.  No odor or drainage, very painful to touch. Generalized erythremia and edema surrounding wound.  Dressing procedure/placement/frequency: Continue present plan of care to left foot as ordered by the wound care center with silver alginate dressing and foam cushion for offloading.  Foam dressing to right foot to protect from further injury.  Pt is currently on IV antibiotics and states she has a history of MRSA in the past.  She appears to be well-informed regarding topical treatment and can resume follow-up with the outpatient wound care center after discharge. Please re-consult if further assistance is needed.  Thank-you,  Julien Girt MSN, [place], [place], [place], [place]

## 2014-06-03 NOTE — Progress Notes (Addendum)
Patient admitted after midnight.  Please see H&P.   1. Diabetic ulcer of R foot with infection and sepsis - 1. Tylenol PRN fever 2. WBC count normalized 3. MRSA swab negative- change abx to rocephin/flagyl 4. Norco for pain/added QHS neurontin 2. CKD - with creatinine elevation today to 1.9 from her baseline of 1.5 in December. 1. BUN is unchanged, may represent progression of CKD, or may just represent elevation in creatinine due to being on bactrim for past 2 weeks (was also on bactrim when creatinine was 2.0 on Feb 25th. 2. Will hold losartan 3. Repeat BMP in AM 4. Also supposed to be getting epogyn shots for anemia of CKD, but dosent have apointment for next one until March 15th- will try to arrange 3. DM1 - continue insulin pump per protocol  Patient states she must be d/c'd by Saturday as she has an event to go to  [employer] DO

## 2014-06-03 NOTE — H&P (Signed)
[facility] History and Physical  [name] [id number] DOB: 08-Nov-1970 DOA: 06/02/2014  Referring physician: EDP PCP: No primary care provider on file.   Chief Complaint: Foot pain   HPI: [name] is a 44 y.o. female with h/o poorly controlled DM1, osteomyelitis of left foot due to diabetic foot ulcer.  Patient presents with worsening blister and cellulitis of her right foot due to a diabetic foot ulcer.  X ray was negative for osteomyelitis today.  Pain is intermittent, sharp, stabbing, 7/10.  There have been associated fever and chills over the past week or so.  She has been on bactrim for her other foot for about 2 weeks now.  Other foot wound is healing she states.  She saw wound care doc last this morning who sent her in to the ED for evaluation.  Review of Systems: Systems reviewed.  As above, otherwise negative  Past Medical History  Diagnosis Date  . Diabetes mellitus   . Neuropathy in diabetes   . Hypertension   . Hypercholesteremia   . H/O seasonal allergies   . Anemia   . Left foot infection    Past Surgical History  Procedure Laterality Date  . Cesarean section    . Eye surgery    . Hemrrhoidectomy    . Peripherally inserted central catheter insertion     Social History:  reports that she quit smoking about 6 years ago. She has never used smokeless tobacco. She reports that she drinks about 0.5 oz of alcohol per week. She reports that she does not use illicit drugs.  Allergies  Allergen Reactions  . Ciprofloxacin Itching  . Cleocin [Clindamycin Hcl]     Diarrhea     History reviewed. No pertinent family history.   Prior to Admission medications   Medication Sig Start Date End Date Taking? Authorizing Provider  sulfamethoxazole-trimethoprim (BACTRIM DS,SEPTRA DS) 800-160 MG per tablet Take 1 tablet by mouth 2 (two) times daily.   Yes Historical Provider, MD  albuterol (PROVENTIL HFA;VENTOLIN HFA) 108 (90 BASE) MCG/ACT inhaler Inhale  2 puffs into the lungs every 6 (six) hours as needed. S.O.B.    Historical Provider, MD  calcitRIOL (ROCALTROL) 0.25 MCG capsule Take 0.25 mcg by mouth.    Historical Provider, MD  ferrous sulfate 325 (65 FE) MG tablet Take 325 mg by mouth. 11/23/13   Historical Provider, MD  insulin aspart (NOVOLOG) 100 UNIT/ML injection Use max 65 units per day with pump 09/02/13   Elayne Snare, MD  losartan (COZAAR) 25 MG tablet Take 1 tablet (25 mg total) by mouth daily. 05/27/14   Elayne Snare, MD  mupirocin ointment (BACTROBAN) 2 %  11/23/13   Historical Provider, MD  pravastatin (PRAVACHOL) 40 MG tablet Take 0.5 tablets (20 mg total) by mouth daily. Patient not taking: Reported on 05/27/2014 08/05/13   Elayne Snare, MD  sodium bicarbonate 650 MG tablet Take 1 tablet (650 mg total) by mouth 3 (three) times daily. 06/15/13   Bonnielee Haff, MD  valACYclovir (VALTREX) 500 MG tablet Take 500 mg by mouth. 06/16/13 06/17/14  Historical Provider, MD   Physical Exam: Filed Vitals:   06/02/14 2305  BP: 121/67  Pulse: 80  Temp: 98.7 F (37.1 C)  Resp: 18    BP 121/67 mmHg  Pulse 80  Temp(Src) 98.7 F (37.1 C) (Oral)  Resp 18  Ht 5\' 7"  (1.702 m)  Wt 58.968 kg (130 lb)  BMI 20.36 kg/m2  SpO2 100%  LMP 05/21/2014  General  Appearance:    Alert, oriented, no distress, appears stated age  Head:    Normocephalic, atraumatic  Eyes:    PERRL, EOMI, sclera non-icteric        Nose:   Nares without drainage or epistaxis. Mucosa, turbinates normal  Throat:   Moist mucous membranes. Oropharynx without erythema or exudate.  Neck:   Supple. No carotid bruits.  No thyromegaly.  No lymphadenopathy.   Back:     No CVA tenderness, no spinal tenderness  Lungs:     Clear to auscultation bilaterally, without wheezes, rhonchi or rales  Chest wall:    No tenderness to palpitation  Heart:    Regular rate and rhythm without murmurs, gallops, rubs  Abdomen:     Soft, non-tender, nondistended, normal bowel sounds, no organomegaly   Genitalia:    deferred  Rectal:    deferred  Extremities:   No clubbing, cyanosis or edema.  Pulses:   2+ and symmetric all extremities  Skin:   Stage 2 diabetic ulcer on plantar surface of R foot below the great toe.  Surrounding erythema that tracks over the dorsum of the foot.  Foot is TTP  Lymph nodes:   Cervical, supraclavicular, and axillary nodes normal  Neurologic:   CNII-XII intact. Normal strength, sensation and reflexes      throughout    Labs on Admission:  Basic Metabolic Panel:  Recent Labs Lab 05/27/14 1613 06/02/14 1900  NA 131* 126*  K 5.9* 5.3*  CL 109 107  CO2 17* 14*  GLUCOSE 313* 237*  BUN 33* 31*  CREATININE 2.01* 1.96*  CALCIUM 8.6 8.0*   Liver Function Tests: No results for input(s): AST, ALT, ALKPHOS, BILITOT, PROT, ALBUMIN in the last 168 hours. No results for input(s): LIPASE, AMYLASE in the last 168 hours. No results for input(s): AMMONIA in the last 168 hours. CBC:  Recent Labs Lab 06/02/14 1900  WBC 16.0*  HGB 9.3*  HCT 29.6*  MCV 79.6  PLT 552*   Cardiac Enzymes: No results for input(s): CKTOTAL, CKMB, CKMBINDEX, TROPONINI in the last 168 hours.  BNP (last 3 results) No results for input(s): PROBNP in the last 8760 hours. CBG:  Recent Labs Lab 06/02/14 1759  GLUCAP 156*    Radiological Exams on Admission: Dg Foot Complete Right  06/02/2014   CLINICAL DATA:  Subsequent encounter for diffuse right foot pain. Symptoms are persisting for a few days.  EXAM: RIGHT FOOT COMPLETE - 3+ VIEW  COMPARISON:  03/19/2014.  FINDINGS: No evidence for an acute fracture. Deformity of the fourth toe proximal phalanx is compatible with remote trauma. No worrisome lytic or sclerotic osseous abnormality.  IMPRESSION: No acute bony findings.   Electronically Signed   By: Misty Stanley M.D.   On: 06/02/2014 15:36    EKG: Independently reviewed.  Assessment/Plan Principal Problem:   Cellulitis of right foot Active Problems:   Type 1 diabetes  mellitus with neurological manifestations, uncontrolled   Sepsis   CKD (chronic kidney disease)   DM type 1 causing renal disease   Diabetic ulcer of right foot   1. Diabetic ulcer of R foot with infection and sepsis - 1. Tylenol PRN fever 2. Trend leukocytosis with repeat CBC 3. Vancomycin given the patients known history of MRSA infection in the past and failure of bactrim. 4. Norco for pain 2. CKD - with creatinine elevation today to 1.9 from her baseline of 1.5 in December. 1. BUN is unchanged, may represent progression of CKD, or may  just represent elevation in creatinine due to being on bactrim for past 2 weeks (was also on bactrim when creatinine was 2.0 on Feb 25th. 2. Will hold losartan 3. Repeat BMP in AM 4. Also supposed to be getting epogyn shots for anemia of CKD, but dosent have apointment for next one until March 15th 3. DM1 - continue insulin pump per protocol    Code Status: Full Code  Family Communication: No family in room Disposition Plan: Admit to inpatient   Time spent: 70 min  GARDNER, JARED M. [facility] Pager [phone]  If 7AM-7PM, please contact the day team taking care of the patient [email or web address] Password [email or web address] 06/03/2014, 12:13 AM

## 2014-06-03 NOTE — Progress Notes (Signed)
Hypoglycemic Event  CBG: 62  Treatment: 15 GM carbohydrate snack  Symptoms: None  Follow-up CBG: Time:2100 CBG Result:73  Possible Reasons for Event: Inadequate meal intake  Comments/MD notified:    [name]  Remember to initiate Hypoglycemia Order Set & complete

## 2014-06-03 NOTE — Progress Notes (Signed)
Inpatient Diabetes Program Recommendations  AACE/ADA: New Consensus Statement on Inpatient Glycemic Control (2013)  Target Ranges:  Prepandial:   less than 140 mg/dL      Peak postprandial:   less than 180 mg/dL (1-2 hours)      Critically ill patients:  140 - 180 mg/dL   Reason for Visit: Diabetes Consult  Diabetes history: DM1 Outpatient Diabetes medications: Insulin pump Current orders for Inpatient glycemic control: Insulin pump  Consult regarding CHO ticket on hospital breakfast tray. Pt is on insulin pump and states hospital dietary CHO ticket was incorrect with breakfast meal. Yogurt had 14 grams of CHO and ticket states 34. Explained that our CHO mod med diets are approx 60-75 grams per meal. Pt did not have ticket available for this writer to see. Has had DM1 since age 44. Endo is Dr. Dwyane Dee. Sees him on regular basis. States Hgba1C is 10% which is improvement for her. Admits to eating whatever she wants. Checks blood sugars 3-7 times/day. Does not know how to check basal and bolus rates and prefers for Coordinator not to "mess with it." Has own supplies with her and changed site on 3/2. Has sheet to record CHOs and boluses for RN.  CF - 50 1:17 - CHO ratio 150 mg/dL is blood sugar goal  Results for SEIRRA, GUTIERRES (MRN [id number]) as of 06/03/2014 11:41  Ref. Range 06/02/2014 17:59 06/02/2014 23:00 06/03/2014 02:15 06/03/2014 08:08  Glucose-Capillary Latest Range: 70-99 mg/dL 156 (H) 188 (H) 77 122 (H)  Results for CHATTIE, RENTER (MRN [id number]) as of 06/03/2014 11:41  Ref. Range 05/27/2014 16:13  Hemoglobin A1C Latest Range: 4.6-6.5 % 10.0 (H)   Will follow while inpatient. Thank you. Lorenda Peck, RD, LDN, CDE Inpatient Diabetes Coordinator [phone]

## 2014-06-03 NOTE — Progress Notes (Signed)
Utilization Review Completed.[name] T3/06/2014  

## 2014-06-04 ENCOUNTER — Inpatient Hospital Stay (HOSPITAL_COMMUNITY): Payer: Medicaid Other

## 2014-06-04 LAB — BASIC METABOLIC PANEL
ANION GAP: 9 (ref 5–15)
BUN: 32 mg/dL — ABNORMAL HIGH (ref 6–23)
CALCIUM: 7.9 mg/dL — AB (ref 8.4–10.5)
CO2: 13 mmol/L — AB (ref 19–32)
CREATININE: 1.96 mg/dL — AB (ref 0.50–1.10)
Chloride: 111 mmol/L (ref 96–112)
GFR calc non Af Amer: 30 mL/min — ABNORMAL LOW (ref >90)
GFR, EST AFRICAN AMERICAN: 35 mL/min — AB (ref >90)
GLUCOSE: 189 mg/dL — AB (ref 70–99)
Potassium: 5 mmol/L (ref 3.5–5.1)
Sodium: 133 mmol/L — ABNORMAL LOW (ref 135–145)

## 2014-06-04 LAB — GLUCOSE, CAPILLARY
GLUCOSE-CAPILLARY: 144 mg/dL — AB (ref 70–99)
GLUCOSE-CAPILLARY: 89 mg/dL (ref 70–99)
Glucose-Capillary: 221 mg/dL — ABNORMAL HIGH (ref 70–99)
Glucose-Capillary: 112 mg/dL — ABNORMAL HIGH (ref 70–99)
Glucose-Capillary: 263 mg/dL — ABNORMAL HIGH (ref 70–99)

## 2014-06-04 LAB — CBC
HCT: 25.7 % — ABNORMAL LOW (ref 36.0–46.0)
Hemoglobin: 8 g/dL — ABNORMAL LOW (ref 12.0–15.0)
MCH: 25 pg — AB (ref 26.0–34.0)
MCHC: 31.1 g/dL (ref 30.0–36.0)
MCV: 80.3 fL (ref 78.0–100.0)
PLATELETS: 481 10*3/uL — AB (ref 150–400)
RBC: 3.2 MIL/uL — AB (ref 3.87–5.11)
RDW: 16.7 % — ABNORMAL HIGH (ref 11.5–15.5)
WBC: 9.6 10*3/uL (ref 4.0–10.5)

## 2014-06-04 MED ORDER — FERROUS SULFATE 325 (65 FE) MG PO TABS
325.0000 mg | ORAL_TABLET | Freq: Every day | ORAL | Status: DC
  Administered 2014-06-04 – 2014-06-11 (×6): 325 mg via ORAL
  Filled 2014-06-04 (×9): qty 1

## 2014-06-04 MED ORDER — ONDANSETRON HCL 4 MG/2ML IJ SOLN
4.0000 mg | Freq: Four times a day (QID) | INTRAMUSCULAR | Status: DC | PRN
  Administered 2014-06-04 – 2014-06-05 (×2): 4 mg via INTRAVENOUS
  Filled 2014-06-04 (×2): qty 2

## 2014-06-04 MED ORDER — ACETAMINOPHEN 325 MG PO TABS
650.0000 mg | ORAL_TABLET | Freq: Four times a day (QID) | ORAL | Status: DC | PRN
  Administered 2014-06-06 – 2014-06-11 (×3): 650 mg via ORAL
  Filled 2014-06-04 (×3): qty 2

## 2014-06-04 MED ORDER — VITAMIN B-12 100 MCG PO TABS
100.0000 ug | ORAL_TABLET | Freq: Every day | ORAL | Status: DC
  Administered 2014-06-04 – 2014-06-18 (×13): 100 ug via ORAL
  Filled 2014-06-04 (×15): qty 1

## 2014-06-04 MED ORDER — SENNOSIDES-DOCUSATE SODIUM 8.6-50 MG PO TABS
2.0000 | ORAL_TABLET | Freq: Every evening | ORAL | Status: DC | PRN
  Filled 2014-06-04: qty 2

## 2014-06-04 MED ORDER — PIPERACILLIN-TAZOBACTAM 3.375 G IVPB
3.3750 g | Freq: Three times a day (TID) | INTRAVENOUS | Status: DC
  Administered 2014-06-04 – 2014-06-07 (×10): 3.375 g via INTRAVENOUS
  Filled 2014-06-04 (×11): qty 50

## 2014-06-04 MED ORDER — FOLIC ACID 1 MG PO TABS
1.0000 mg | ORAL_TABLET | Freq: Every day | ORAL | Status: DC
  Administered 2014-06-04 – 2014-06-18 (×14): 1 mg via ORAL
  Filled 2014-06-04 (×15): qty 1

## 2014-06-04 MED ORDER — VANCOMYCIN HCL IN DEXTROSE 750-5 MG/150ML-% IV SOLN
750.0000 mg | INTRAVENOUS | Status: DC
Start: 2014-06-04 — End: 2014-06-07
  Administered 2014-06-04 – 2014-06-07 (×4): 750 mg via INTRAVENOUS
  Filled 2014-06-04 (×4): qty 150

## 2014-06-04 NOTE — Progress Notes (Addendum)
[facility] PROGRESS NOTE  [name] [id number] DOB: [birth year] DOA: 06/02/2014 PCP: No primary care provider on file.  Assessment/Plan:  Principal Problem:   Cellulitis of right foot: still looks severe. Will broaden abx as at risk for polymicrobial, pseudomonal, MRSA infection. vanc and zosyn. Check MRI r/o abscess/osteo Active Problems:   Type 1 diabetes mellitus with neurological manifestations, uncontrolled: continue insulin pump   No evidence of sepsis   CKD (chronic kidney disease)   DM type 1 causing renal disease   Diabetic ulcer of right foot Chronic anemia: resume iron.  HPI/Subjective: Worried about new areas of blistering right foot. No f/c/n/v  Objective: Filed Vitals:   06/04/14 1000  BP: 144/75  Pulse: 95  Temp: 99.5 F (37.5 C)  Resp: 18    Intake/Output Summary (Last 24 hours) at 06/04/14 1254 Last data filed at 06/04/14 0900  Gross per 24 hour  Intake   1310 ml  Output      0 ml  Net   1310 ml   Filed Weights   06/02/14 1747 06/03/14 0019 06/03/14 1958  Weight: 58.968 kg (130 lb) 60.782 kg (134 lb) 60.328 kg (133 lb)    Exam:   General:  Nontoxic. A, o and talkative  Cardiovascular: rrr without mgr  Respiratory: cta without wrr  Abdomen: s, nt, nd  Ext: left foot with chronic plantar ulcer, no drainage or erythema.  Right plantar surface with blister midfoot, erythema, boggy. No odor or drainage  Basic Metabolic Panel:  Recent Labs Lab 06/02/14 1900 06/03/14 0308 06/04/14 0520  NA 126* 136 133*  K 5.3* 4.4 5.0  CL 107 111 111  CO2 14* 16* 13*  GLUCOSE 237* 58* 189*  BUN 31* 32* 32*  CREATININE 1.96* 2.07* 1.96*  CALCIUM 8.0* 8.2* 7.9*   Liver Function Tests: No results for input(s): AST, ALT, ALKPHOS, BILITOT, PROT, ALBUMIN in the last 168 hours. No results for input(s): LIPASE, AMYLASE in the last 168 hours. No results for input(s): AMMONIA in the last 168 hours. CBC:  Recent Labs Lab 06/02/14 1900  06/03/14 0308 06/04/14 0520  WBC 16.0* 8.1 9.6  HGB 9.3* 8.4* 8.0*  HCT 29.6* 26.4* 25.7*  MCV 79.6 78.6 80.3  PLT 552* 514* 481*   Cardiac Enzymes: No results for input(s): CKTOTAL, CKMB, CKMBINDEX, TROPONINI in the last 168 hours. BNP (last 3 results) No results for input(s): BNP in the last 8760 hours.  ProBNP (last 3 results) No results for input(s): PROBNP in the last 8760 hours.  CBG:  Recent Labs Lab 06/03/14 2017 06/03/14 2059 06/04/14 0347 06/04/14 0750 06/04/14 1150  GLUCAP 62* 73 221* 144* 112*    Recent Results (from the past 240 hour(s))  Blood culture (routine x 2)     Status: None (Preliminary result)   Collection Time: 06/02/14  6:35 PM  Result Value Ref Range Status   Specimen Description BLOOD LEFT FA  Final   Special Requests BOTTLES DRAWN AEROBIC AND ANAEROBIC 5CC EACH  Final   Culture   Final           BLOOD CULTURE RECEIVED NO GROWTH TO DATE CULTURE WILL BE HELD FOR 5 DAYS BEFORE ISSUING A FINAL NEGATIVE REPORT Performed at [facility]    Report Status PENDING  Incomplete  Blood culture (routine x 2)     Status: None (Preliminary result)   Collection Time: 06/02/14  7:00 PM  Result Value Ref Range Status   Specimen Description BLOOD RIGHT AC  Final   Special Requests BOTTLES DRAWN AEROBIC AND ANAEROBIC 5 CC EACH  Final   Culture   Final           BLOOD CULTURE RECEIVED NO GROWTH TO DATE CULTURE WILL BE HELD FOR 5 DAYS BEFORE ISSUING A FINAL NEGATIVE REPORT Performed at [facility]    Report Status PENDING  Incomplete  MRSA PCR Screening     Status: None   Collection Time: 06/03/14 12:12 AM  Result Value Ref Range Status   MRSA by PCR NEGATIVE NEGATIVE Final    Comment:        The GeneXpert MRSA Assay (FDA approved for NASAL specimens only), is one component of a comprehensive MRSA colonization surveillance program. It is not intended to diagnose MRSA infection nor to guide or monitor treatment for MRSA  infections.      Studies: Dg Foot Complete Right  2014-06-10   CLINICAL DATA:  Subsequent encounter for diffuse right foot pain. Symptoms are persisting for a few days.  EXAM: RIGHT FOOT COMPLETE - 3+ VIEW  COMPARISON:  03/19/2014.  FINDINGS: No evidence for an acute fracture. Deformity of the fourth toe proximal phalanx is compatible with remote trauma. No worrisome lytic or sclerotic osseous abnormality.  IMPRESSION: No acute bony findings.   Electronically Signed   By: Misty Stanley M.D.   On: 2014/06/10 15:36    Scheduled Meds: . calcitRIOL  0.25 mcg Oral Daily  . [START ON 06/05/2014] ferrous sulfate  325 mg Oral Q breakfast  . folic acid  1 mg Oral Daily  . gabapentin  300 mg Oral QHS  . heparin subcutaneous  5,000 Units Subcutaneous 3 times per day  . insulin pump   Subcutaneous TID AC, HS, 0200  . piperacillin-tazobactam (ZOSYN)  IV  3.375 g Intravenous 3 times per day  . pravastatin  20 mg Oral Daily  . sodium bicarbonate  650 mg Oral TID  . valACYclovir  500 mg Oral Daily  . vitamin B-12  100 mcg Oral Daily   Continuous Infusions:   Time spent: 25 minutes  [facility] [email or web address], password [email or web address] 06/04/2014, 12:54 PM  LOS: 2 days

## 2014-06-04 NOTE — Progress Notes (Signed)
Nurse asked the patient how much insulin she gave herself at noon time, she said " why should i ? i didn't ate much becouse they took me to the MRI and just came back,besides my blood sugar was just more than a 100.Im just going wait for my diiner".

## 2014-06-04 NOTE — Progress Notes (Signed)
When the nurse asked the patient how many units she given herself the patient responded ' i didn't give anything to myself becouse i didn't eat my breakfast''.

## 2014-06-04 NOTE — Progress Notes (Signed)
Temperature of 101.7. Medicated. Patient is on Zosyn and and Vancomycin at present. BC pending. NP paged for an update.

## 2014-06-04 NOTE — Progress Notes (Signed)
ANTIBIOTIC CONSULT NOTE   Pharmacy Consult for vancomycin Indication: cellulitis /diabetic wound  Allergies  Allergen Reactions  . Ciprofloxacin Itching  . Cleocin [Clindamycin Hcl]     Diarrhea     Patient Measurements: Height: 5\' 7"  (170.2 cm) Weight: 133 lb (60.328 kg) IBW/kg (Calculated) : 61.6  Vital Signs: Temp: 99.5 F (37.5 C) (03/04 1000) Temp Source: Oral (03/04 1000) BP: 144/75 mmHg (03/04 1000) Pulse Rate: 95 (03/04 1000)  Labs:  Recent Labs  06/02/14 1900 06/03/14 0308 06/04/14 0520  WBC 16.0* 8.1 9.6  HGB 9.3* 8.4* 8.0*  PLT 552* 514* 481*  CREATININE 1.96* 2.07* 1.96*   Estimated Creatinine Clearance: 35.6 mL/min (by C-G formula based on Cr of 1.96).  Assessment: 44yo female c/o "blister" on right foot x3wk w/ pain, redness, and intermittent fevers, noted h/o osteo of left foot. Her Xray by wound care specialist PTA reportedly negative for signs of osteo in R food. She was started on only vancomycin on 3/2- received 1g in the ED. Her coverage was changed to ceftriaxone + Flagyl on 3/3 for better coverage of diabetic wounds. Wound not improving. Now to broaden to vancomycin + Zosyn  SCr 1.9, est CrCl ~30-61mL/min. WBC have improved, patient is afebrile. Blood cultures are NGTD.  Goal of Therapy:  Vancomycin trough level 10-15 mcg/ml  Plan:  -vancomycin 750mg  IV q24h -follow clinical progression, renal function, trough at SS -Zosyn 3.375g IV q8h EI per MD (appropriate dosing)  Kailan Carmen D. Kaynen Minner, PharmD, BCPS Clinical Pharmacist Pager: [phone] 06/04/2014 1:28 PM

## 2014-06-05 DIAGNOSIS — D509 Iron deficiency anemia, unspecified: Secondary | ICD-10-CM

## 2014-06-05 DIAGNOSIS — E08621 Diabetes mellitus due to underlying condition with foot ulcer: Secondary | ICD-10-CM

## 2014-06-05 LAB — CBC
HCT: 28.4 % — ABNORMAL LOW (ref 36.0–46.0)
Hemoglobin: 9 g/dL — ABNORMAL LOW (ref 12.0–15.0)
MCH: 25.1 pg — AB (ref 26.0–34.0)
MCHC: 31.7 g/dL (ref 30.0–36.0)
MCV: 79.3 fL (ref 78.0–100.0)
Platelets: 521 10*3/uL — ABNORMAL HIGH (ref 150–400)
RBC: 3.58 MIL/uL — ABNORMAL LOW (ref 3.87–5.11)
RDW: 16.6 % — ABNORMAL HIGH (ref 11.5–15.5)
WBC: 10.2 10*3/uL (ref 4.0–10.5)

## 2014-06-05 LAB — BASIC METABOLIC PANEL
ANION GAP: 14 (ref 5–15)
BUN: 35 mg/dL — ABNORMAL HIGH (ref 6–23)
CALCIUM: 8.3 mg/dL — AB (ref 8.4–10.5)
CO2: 15 mmol/L — AB (ref 19–32)
CREATININE: 2.08 mg/dL — AB (ref 0.50–1.10)
Chloride: 103 mmol/L (ref 96–112)
GFR calc Af Amer: 33 mL/min — ABNORMAL LOW (ref >90)
GFR, EST NON AFRICAN AMERICAN: 28 mL/min — AB (ref >90)
Glucose, Bld: 186 mg/dL — ABNORMAL HIGH (ref 70–99)
POTASSIUM: 4.9 mmol/L (ref 3.5–5.1)
Sodium: 132 mmol/L — ABNORMAL LOW (ref 135–145)

## 2014-06-05 LAB — GLUCOSE, CAPILLARY
GLUCOSE-CAPILLARY: 184 mg/dL — AB (ref 70–99)
Glucose-Capillary: 222 mg/dL — ABNORMAL HIGH (ref 70–99)
Glucose-Capillary: 175 mg/dL — ABNORMAL HIGH (ref 70–99)
Glucose-Capillary: 244 mg/dL — ABNORMAL HIGH (ref 70–99)
Glucose-Capillary: 130 mg/dL — ABNORMAL HIGH (ref 70–99)

## 2014-06-05 LAB — C-REACTIVE PROTEIN: CRP: 18.7 mg/dL — ABNORMAL HIGH (ref <0.60)

## 2014-06-05 LAB — SEDIMENTATION RATE: SED RATE: 138 mm/h — AB (ref 0–22)

## 2014-06-05 MED ORDER — BENZONATATE 100 MG PO CAPS
100.0000 mg | ORAL_CAPSULE | Freq: Three times a day (TID) | ORAL | Status: DC | PRN
  Administered 2014-06-05 – 2014-06-07 (×4): 100 mg via ORAL
  Filled 2014-06-05 (×5): qty 1

## 2014-06-05 NOTE — Progress Notes (Signed)
[facility] PROGRESS NOTE  [name] [id number] DOB: 25-Jun-1970 DOA: 06/02/2014 PCP: No primary care provider on file.  Assessment/Plan:  Principal Problem:   Cellulitis of right foot: MRI without osteomyelitis or deep abscess. 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  HPI/Subjective: Pain slightly less. C/o intermittent nonproductive cough  Objective: Filed Vitals:   06/05/14 0906  BP: 120/65  Pulse: 83  Temp: 98.8 F (37.1 C)  Resp: 17    Intake/Output Summary (Last 24 hours) at 06/05/14 1144 Last data filed at 06/05/14 0907  Gross per 24 hour  Intake    730 ml  Output     50 ml  Net    680 ml   Filed Weights   06/03/14 0019 06/03/14 1958 06/04/14 2100  Weight: 60.782 kg (134 lb) 60.328 kg (133 lb) 60.601 kg (133 lb 9.6 oz)    Exam:   General:  Nontoxic. A, o and talkative  HEENT: no thrush. Op without erythema or exudate  Cardiovascular: rrr without mgr  Respiratory: cta without wrr  Abdomen: s, nt, nd  Ext: left foot with chronic plantar ulcer, no drainage or erythema.  Right plantar surface with blister midfoot, extending, erythema, and boggy induration slightly improved today. No odor or drainage  Basic Metabolic Panel:  Recent Labs Lab 06/02/14 1900 06/03/14 0308 06/04/14 0520 06/05/14 0450  NA 126* 136 133* 132*  K 5.3* 4.4 5.0 4.9  CL 107 111 111 103  CO2 14* 16* 13* 15*  GLUCOSE 237* 58* 189* 186*  BUN 31* 32* 32* 35*  CREATININE 1.96* 2.07* 1.96* 2.08*  CALCIUM 8.0* 8.2* 7.9* 8.3*   Liver Function Tests: No results for input(s): AST, ALT, ALKPHOS, BILITOT, PROT, ALBUMIN in the last 168 hours. No results for input(s): LIPASE, AMYLASE in the  last 168 hours. No results for input(s): AMMONIA in the last 168 hours. CBC:  Recent Labs Lab 06/02/14 1900 06/03/14 0308 06/04/14 0520 06/05/14 0450  WBC 16.0* 8.1 9.6 10.2  HGB 9.3* 8.4* 8.0* 9.0*  HCT 29.6* 26.4* 25.7* 28.4*  MCV 79.6 78.6 80.3 79.3  PLT 552* 514* 481* 521*   Cardiac Enzymes: No results for input(s): CKTOTAL, CKMB, CKMBINDEX, TROPONINI in the last 168 hours. BNP (last 3 results) No results for input(s): BNP in the last 8760 hours.  ProBNP (last 3 results) No results for input(s): PROBNP in the last 8760 hours.  CBG:  Recent Labs Lab 06/04/14 1615 06/04/14 2223 06/05/14 0245 06/05/14 0753 06/05/14 1141  GLUCAP 89 263* 222* 175* 244*    Recent Results (from the past 240 hour(s))  Blood culture (routine x 2)     Status: None (Preliminary result)   Collection Time: 06/02/14  6:35 PM  Result Value Ref Range Status   Specimen Description BLOOD LEFT FA  Final   Special Requests BOTTLES DRAWN AEROBIC AND ANAEROBIC 5CC EACH  Final   Culture   Final           BLOOD CULTURE RECEIVED NO GROWTH TO DATE CULTURE WILL BE HELD FOR 5 DAYS BEFORE ISSUING A FINAL NEGATIVE REPORT Performed at [facility]    Report Status PENDING  Incomplete  Blood culture (routine x 2)  Status: None (Preliminary result)   Collection Time: 06/02/14  7:00 PM  Result Value Ref Range Status   Specimen Description BLOOD RIGHT AC  Final   Special Requests BOTTLES DRAWN AEROBIC AND ANAEROBIC 5 CC EACH  Final   Culture   Final           BLOOD CULTURE RECEIVED NO GROWTH TO DATE CULTURE WILL BE HELD FOR 5 DAYS BEFORE ISSUING A FINAL NEGATIVE REPORT Performed at [facility]    Report Status PENDING  Incomplete  MRSA PCR Screening     Status: None   Collection Time: 06/03/14 12:12 AM  Result Value Ref Range Status   MRSA by PCR NEGATIVE NEGATIVE Final    Comment:        The GeneXpert MRSA Assay (FDA approved for NASAL specimens only), is one component of  a comprehensive MRSA colonization surveillance program. It is not intended to diagnose MRSA infection nor to guide or monitor treatment for MRSA infections.      Studies: Mr Foot Right Wo Contrast  06/04/2014   CLINICAL DATA:  Partial-thickness soft tissue ulcer of the plantar aspect of the right foot under the great toe measuring 1 x 1 cm. Surrounding erythema extending over the dorsum of the foot. Cellulitis.  EXAM: MRI OF THE RIGHT FOREFOOT WITHOUT CONTRAST  TECHNIQUE: Multiplanar, multisequence MR imaging was performed. No intravenous contrast was administered.  COMPARISON:  Radiographs dated 06/02/2014  FINDINGS: There is no osteomyelitis or other significant osseous abnormality. No soft tissue abscesses. No joint effusions. Tendons appear normal. There is a blister on the plantar aspect of the forefoot. There is a tiny area of There is probably there of ulcerations seen on image 12 series 8 at the level of the head of the first metatarsal. No underlying abscess.  Nonspecific subcutaneous edema of the forefoot.  IMPRESSION: 1. Soft tissue ulceration and prominent blister on the plantar aspect of the ball of the foot. 2. No underlying abscess, osteomyelitis, or joint infection.   Electronically Signed   By: Lorriane Shire M.D.   On: 06/04/2014 14:59    Scheduled Meds: . calcitRIOL  0.25 mcg Oral Daily  . ferrous sulfate  325 mg Oral Q breakfast  . folic acid  1 mg Oral Daily  . gabapentin  300 mg Oral QHS  . heparin subcutaneous  5,000 Units Subcutaneous 3 times per day  . insulin pump   Subcutaneous TID AC, HS, 0200  . piperacillin-tazobactam (ZOSYN)  IV  3.375 g Intravenous 3 times per day  . pravastatin  20 mg Oral Daily  . sodium bicarbonate  650 mg Oral TID  . valACYclovir  500 mg Oral Daily  . vancomycin  750 mg Intravenous Q24H  . vitamin B-12  100 mcg Oral Daily   Continuous Infusions:   Time spent: 15 minutes  [facility] [email or web address],  password [facility] 06/05/2014, 11:44 AM  LOS: 3 days

## 2014-06-05 NOTE — Progress Notes (Signed)
Patient not given anything yet at this time,''i haven't eaten yet as of this time'].

## 2014-06-05 NOTE — Progress Notes (Signed)
Patient's told by this nurse that she did not given her]elf any insulin  From her insulin pump with a blood sugar of 174 and hardly eaten her breakfast.

## 2014-06-06 LAB — CBC
HEMATOCRIT: 23.1 % — AB (ref 36.0–46.0)
Hemoglobin: 7.4 g/dL — ABNORMAL LOW (ref 12.0–15.0)
MCH: 25.7 pg — ABNORMAL LOW (ref 26.0–34.0)
MCHC: 32 g/dL (ref 30.0–36.0)
MCV: 80.2 fL (ref 78.0–100.0)
PLATELETS: 476 10*3/uL — AB (ref 150–400)
RBC: 2.88 MIL/uL — ABNORMAL LOW (ref 3.87–5.11)
RDW: 16.5 % — ABNORMAL HIGH (ref 11.5–15.5)
WBC: 16.3 10*3/uL — ABNORMAL HIGH (ref 4.0–10.5)

## 2014-06-06 LAB — BASIC METABOLIC PANEL
ANION GAP: 11 (ref 5–15)
BUN: 40 mg/dL — ABNORMAL HIGH (ref 6–23)
CO2: 18 mmol/L — ABNORMAL LOW (ref 19–32)
Calcium: 7.9 mg/dL — ABNORMAL LOW (ref 8.4–10.5)
Chloride: 102 mmol/L (ref 96–112)
Creatinine, Ser: 3.09 mg/dL — ABNORMAL HIGH (ref 0.50–1.10)
GFR calc Af Amer: 20 mL/min — ABNORMAL LOW (ref >90)
GFR calc non Af Amer: 18 mL/min — ABNORMAL LOW (ref >90)
GLUCOSE: 240 mg/dL — AB (ref 70–99)
POTASSIUM: 5.1 mmol/L (ref 3.5–5.1)
Sodium: 131 mmol/L — ABNORMAL LOW (ref 135–145)

## 2014-06-06 LAB — GLUCOSE, CAPILLARY
GLUCOSE-CAPILLARY: 178 mg/dL — AB (ref 70–99)
GLUCOSE-CAPILLARY: 233 mg/dL — AB (ref 70–99)
GLUCOSE-CAPILLARY: 275 mg/dL — AB (ref 70–99)
GLUCOSE-CAPILLARY: 74 mg/dL (ref 70–99)
Glucose-Capillary: 164 mg/dL — ABNORMAL HIGH (ref 70–99)

## 2014-06-06 MED ORDER — INSULIN ASPART 100 UNIT/ML ~~LOC~~ SOLN
1000.0000 [IU] | Freq: Once | SUBCUTANEOUS | Status: AC
  Administered 2014-06-06: 1000 [IU] via SUBCUTANEOUS
  Filled 2014-06-06: qty 10

## 2014-06-06 MED ORDER — PANTOPRAZOLE SODIUM 40 MG IV SOLR
40.0000 mg | Freq: Two times a day (BID) | INTRAVENOUS | Status: DC
  Administered 2014-06-06 – 2014-06-09 (×6): 40 mg via INTRAVENOUS
  Filled 2014-06-06 (×8): qty 40

## 2014-06-06 NOTE — Progress Notes (Signed)
Pt stated she has not given herself insulin for bed time dose. CBG=130, pt c/o nausea and decreased appetite.

## 2014-06-06 NOTE — Progress Notes (Signed)
Paged Diabetic Coordinator on call for insulin refill protocol for patent's insulin pump as patient requested for her insulin pump refill.Patient said her insulin is plain Novolog as per call made from her clinic. On call diabetic coordinator advised this nurse to request it from the pharmacy and have it charged to the patient the patient.

## 2014-06-06 NOTE — Progress Notes (Signed)
Pt stated she has not given any insulin to herself this time. Pt has vomited earlier, decrease in appetite noted.

## 2014-06-06 NOTE — Progress Notes (Addendum)
[facility] PROGRESS NOTE  [name] [id number] DOB: [birth year] DOA: 06/02/2014 PCP: No primary care provider on file.  Assessment/Plan:    Cellulitis of right foot: MRI without osteomyelitis or deep abscess. 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 -routine check of HIV antibody  HPI/Subjective: C/o coughing causing vomiting +diarrhea  Objective: Filed Vitals:   06/06/14 0937  BP: 116/58  Pulse: 89  Temp: 99.4 F (37.4 C)  Resp: 17    Intake/Output Summary (Last 24 hours) at 06/06/14 1007 Last data filed at 06/06/14 0938  Gross per 24 hour  Intake   1000 ml  Output     50 ml  Net    950 ml   Filed Weights   06/03/14 1958 06/04/14 2100 06/05/14 2042  Weight: 60.328 kg (133 lb) 60.601 kg (133 lb 9.6 oz) 60.328 kg (133 lb)    Exam:   General:  Nontoxic. A, o and talkative  HEENT: no thrush. Op without erythema or exudate  Cardiovascular: rrr without mgr  Respiratory: cta without wrr  Abdomen: s, nt, nd  Ext: left foot with chronic plantar ulcer, no drainage or erythema.  Right plantar surface with blister midfoot, extending, erythema, and boggy induration slightly improved today. No odor or drainage  Basic Metabolic Panel:  Recent Labs Lab 06/02/14 1900 06/03/14 0308 06/04/14 0520 06/05/14 0450  NA 126* 136 133* 132*  K 5.3* 4.4 5.0 4.9  CL 107 111 111 103  CO2 14* 16* 13* 15*  GLUCOSE 237* 58* 189* 186*  BUN 31* 32* 32* 35*  CREATININE 1.96* 2.07* 1.96* 2.08*  CALCIUM 8.0* 8.2* 7.9* 8.3*   Liver Function Tests: No results for input(s): AST, ALT, ALKPHOS, BILITOT, PROT, ALBUMIN in the last  168 hours. No results for input(s): LIPASE, AMYLASE in the last 168 hours. No results for input(s): AMMONIA in the last 168 hours. CBC:  Recent Labs Lab 06/02/14 1900 06/03/14 0308 06/04/14 0520 06/05/14 0450  WBC 16.0* 8.1 9.6 10.2  HGB 9.3* 8.4* 8.0* 9.0*  HCT 29.6* 26.4* 25.7* 28.4*  MCV 79.6 78.6 80.3 79.3  PLT 552* 514* 481* 521*   Cardiac Enzymes: No results for input(s): CKTOTAL, CKMB, CKMBINDEX, TROPONINI in the last 168 hours. BNP (last 3 results) No results for input(s): BNP in the last 8760 hours.  ProBNP (last 3 results) No results for input(s): PROBNP in the last 8760 hours.  CBG:  Recent Labs Lab 06/05/14 1141 06/05/14 1646 06/05/14 2040 06/06/14 0346 06/06/14 0758  GLUCAP 244* 184* 130* 164* 178*    Recent Results (from the past 240 hour(s))  Blood culture (routine x 2)     Status: None (Preliminary result)   Collection Time: 06/02/14  6:35 PM  Result Value Ref Range Status   Specimen Description BLOOD LEFT FA  Final   Special Requests BOTTLES DRAWN AEROBIC AND ANAEROBIC 5CC EACH  Final   Culture   Final           BLOOD CULTURE RECEIVED NO GROWTH TO DATE CULTURE WILL BE HELD FOR 5 DAYS BEFORE ISSUING A FINAL NEGATIVE REPORT Performed at [facility]    Report Status PENDING  Incomplete  Blood culture (routine x 2)     Status: None (Preliminary result)   Collection Time: 06/02/14  7:00 PM  Result Value Ref Range Status   Specimen Description BLOOD RIGHT AC  Final   Special Requests BOTTLES DRAWN AEROBIC AND ANAEROBIC 5 CC EACH  Final   Culture   Final           BLOOD CULTURE RECEIVED NO GROWTH TO DATE CULTURE WILL BE HELD FOR 5 DAYS BEFORE ISSUING A FINAL NEGATIVE REPORT Performed at [facility]    Report Status PENDING  Incomplete  MRSA PCR Screening     Status: None   Collection Time: 06/03/14 12:12 AM  Result Value Ref Range Status   MRSA by PCR NEGATIVE NEGATIVE Final    Comment:        The GeneXpert MRSA Assay  (FDA approved for NASAL specimens only), is one component of a comprehensive MRSA colonization surveillance program. It is not intended to diagnose MRSA infection nor to guide or monitor treatment for MRSA infections.      Studies: Mr Foot Right Wo Contrast  06/04/2014   CLINICAL DATA:  Partial-thickness soft tissue ulcer of the plantar aspect of the right foot under the great toe measuring 1 x 1 cm. Surrounding erythema extending over the dorsum of the foot. Cellulitis.  EXAM: MRI OF THE RIGHT FOREFOOT WITHOUT CONTRAST  TECHNIQUE: Multiplanar, multisequence MR imaging was performed. No intravenous contrast was administered.  COMPARISON:  Radiographs dated 06/02/2014  FINDINGS: There is no osteomyelitis or other significant osseous abnormality. No soft tissue abscesses. No joint effusions. Tendons appear normal. There is a blister on the plantar aspect of the forefoot. There is a tiny area of There is probably there of ulcerations seen on image 12 series 8 at the level of the head of the first metatarsal. No underlying abscess.  Nonspecific subcutaneous edema of the forefoot.  IMPRESSION: 1. Soft tissue ulceration and prominent blister on the plantar aspect of the ball of the foot. 2. No underlying abscess, osteomyelitis, or joint infection.   Electronically Signed   By: [name] M.D.   On: 06/04/2014 14:59    Scheduled Meds: . calcitRIOL  0.25 mcg Oral Daily  . ferrous sulfate  325 mg Oral Q breakfast  . folic acid  1 mg Oral Daily  . gabapentin  300 mg Oral QHS  . heparin subcutaneous  5,000 Units Subcutaneous 3 times per day  . insulin pump   Subcutaneous TID AC, HS, 0200  . pantoprazole (PROTONIX) IV  40 mg Intravenous Q12H  . piperacillin-tazobactam (ZOSYN)  IV  3.375 g Intravenous 3 times per day  . pravastatin  20 mg Oral Daily  . sodium bicarbonate  650 mg Oral TID  . valACYclovir  500 mg Oral Daily  . vancomycin  750 mg Intravenous Q24H  . vitamin B-12  100 mcg Oral Daily    Continuous Infusions:   Time spent: 25 minutes  [name]  [facility] [email or web address], password [facility] 06/06/2014, 10:07 AM  LOS: 4 days

## 2014-06-06 NOTE — Progress Notes (Signed)
Whole vial of plain Novolog insulin was given to the patient  for her insulin pump refill as witnessed by another nurse.Abigail Butts).

## 2014-06-07 ENCOUNTER — Inpatient Hospital Stay (HOSPITAL_COMMUNITY): Payer: Medicaid Other

## 2014-06-07 LAB — CBC
HEMATOCRIT: 27.4 % — AB (ref 36.0–46.0)
Hemoglobin: 9 g/dL — ABNORMAL LOW (ref 12.0–15.0)
MCH: 25 pg — AB (ref 26.0–34.0)
MCHC: 32.8 g/dL (ref 30.0–36.0)
MCV: 76.1 fL — AB (ref 78.0–100.0)
PLATELETS: 549 10*3/uL — AB (ref 150–400)
RBC: 3.6 MIL/uL — ABNORMAL LOW (ref 3.87–5.11)
RDW: 16.4 % — ABNORMAL HIGH (ref 11.5–15.5)
WBC: 15.4 10*3/uL — ABNORMAL HIGH (ref 4.0–10.5)

## 2014-06-07 LAB — BASIC METABOLIC PANEL
ANION GAP: 10 (ref 5–15)
BUN: 45 mg/dL — ABNORMAL HIGH (ref 6–23)
CO2: 17 mmol/L — AB (ref 19–32)
CREATININE: 4.05 mg/dL — AB (ref 0.50–1.10)
Calcium: 8.1 mg/dL — ABNORMAL LOW (ref 8.4–10.5)
Chloride: 101 mmol/L (ref 96–112)
GFR calc non Af Amer: 13 mL/min — ABNORMAL LOW (ref >90)
GFR, EST AFRICAN AMERICAN: 15 mL/min — AB (ref >90)
Glucose, Bld: 254 mg/dL — ABNORMAL HIGH (ref 70–99)
Potassium: 4.9 mmol/L (ref 3.5–5.1)
Sodium: 128 mmol/L — ABNORMAL LOW (ref 135–145)

## 2014-06-07 LAB — GLUCOSE, CAPILLARY
GLUCOSE-CAPILLARY: 229 mg/dL — AB (ref 70–99)
Glucose-Capillary: 260 mg/dL — ABNORMAL HIGH (ref 70–99)
Glucose-Capillary: 268 mg/dL — ABNORMAL HIGH (ref 70–99)
Glucose-Capillary: 275 mg/dL — ABNORMAL HIGH (ref 70–99)
Glucose-Capillary: 257 mg/dL — ABNORMAL HIGH (ref 70–99)

## 2014-06-07 LAB — CLOSTRIDIUM DIFFICILE BY PCR: Toxigenic C. Difficile by PCR: NEGATIVE

## 2014-06-07 LAB — VANCOMYCIN, TROUGH: Vancomycin Tr: 28.7 ug/mL (ref 10.0–20.0)

## 2014-06-07 LAB — HIV ANTIBODY (ROUTINE TESTING W REFLEX): HIV Screen 4th Generation wRfx: NONREACTIVE

## 2014-06-07 MED ORDER — SODIUM CHLORIDE 0.9 % IV SOLN
INTRAVENOUS | Status: DC
  Administered 2014-06-07: 11:00:00 via INTRAVENOUS

## 2014-06-07 MED ORDER — BENZONATATE 100 MG PO CAPS
100.0000 mg | ORAL_CAPSULE | Freq: Three times a day (TID) | ORAL | Status: DC
  Administered 2014-06-07 – 2014-06-16 (×15): 100 mg via ORAL
  Filled 2014-06-07 (×32): qty 1

## 2014-06-07 MED ORDER — PIPERACILLIN-TAZOBACTAM IN DEX 2-0.25 GM/50ML IV SOLN
2.2500 g | Freq: Three times a day (TID) | INTRAVENOUS | Status: DC
  Administered 2014-06-07 – 2014-06-13 (×17): 2.25 g via INTRAVENOUS
  Filled 2014-06-07 (×20): qty 50

## 2014-06-07 MED ORDER — VANCOMYCIN HCL 500 MG IV SOLR: 500.0000 mg | INTRAVENOUS | Status: DC

## 2014-06-07 MED ORDER — METOCLOPRAMIDE HCL 5 MG/ML IJ SOLN
5.0000 mg | Freq: Three times a day (TID) | INTRAMUSCULAR | Status: DC
  Administered 2014-06-07 – 2014-06-11 (×12): 5 mg via INTRAVENOUS
  Filled 2014-06-07 (×3): qty 1
  Filled 2014-06-07: qty 2
  Filled 2014-06-07: qty 1
  Filled 2014-06-07 (×2): qty 2
  Filled 2014-06-07: qty 1
  Filled 2014-06-07: qty 2
  Filled 2014-06-07 (×2): qty 1
  Filled 2014-06-07: qty 2
  Filled 2014-06-07 (×5): qty 1

## 2014-06-07 NOTE — Progress Notes (Signed)
[facility] PROGRESS NOTE  [name] [id number] DOB: 08-31-1970 DOA: 06/02/2014 PCP: No primary care provider on file.  Assessment/Plan:    Cellulitis of right foot: MRI without osteomyelitis or deep abscess. Asked Dr. 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 -routine check of HIV antibody  HPI/Subjective: Still coughing Coughing causing vomiting  Objective: Filed Vitals:   06/07/14 0809  BP: 116/59  Pulse: 102  Temp: 98.5 F (36.9 C)  Resp: 17    Intake/Output Summary (Last 24 hours) at 06/07/14 1230 Last data filed at 06/07/14 0942  Gross per 24 hour  Intake   1110 ml  Output      0 ml  Net   1110 ml   Filed Weights   06/04/14 2100 06/05/14 2042 06/06/14 2114  Weight: 60.601 kg (133 lb 9.6 oz) 60.328 kg (133 lb) 61.236 kg (135 lb)    Exam:   General:  Nontoxic. A, o and talkative  HEENT: dry appearing  Cardiovascular: rrr without mgr  Respiratory: cta without wrr  Abdomen: s, nt, nd  Ext: left foot with chronic plantar ulcer, no drainage or erythema.  Right plantar surface with blister midfoot, extending, erythema, and boggy induration  No odor, sero-sangenous drainage  Basic Metabolic Panel:  Recent Labs Lab 06/03/14 0308 06/04/14 0520 06/05/14 0450 06/06/14 1146 06/07/14 1111  NA 136 133* 132* 131* 128*  K 4.4 5.0 4.9 5.1 4.9  CL 111 111 103 102 101  CO2 16* 13* 15* 18* 17*  GLUCOSE 58* 189* 186* 240* 254*  BUN 32* 32* 35* 40* 45*  CREATININE 2.07* 1.96* 2.08* 3.09* 4.05*  CALCIUM 8.2* 7.9* 8.3* 7.9* 8.1*   Liver Function Tests: No results for input(s): AST,  ALT, ALKPHOS, BILITOT, PROT, ALBUMIN in the last 168 hours. No results for input(s): LIPASE, AMYLASE in the last 168 hours. No results for input(s): AMMONIA in the last 168 hours. CBC:  Recent Labs Lab 06/03/14 0308 06/04/14 0520 06/05/14 0450 06/06/14 1146 06/07/14 1111  WBC 8.1 9.6 10.2 16.3* 15.4*  HGB 8.4* 8.0* 9.0* 7.4* 9.0*  HCT 26.4* 25.7* 28.4* 23.1* 27.4*  MCV 78.6 80.3 79.3 80.2 76.1*  PLT 514* 481* 521* 476* 549*   Cardiac Enzymes: No results for input(s): CKTOTAL, CKMB, CKMBINDEX, TROPONINI in the last 168 hours. BNP (last 3 results) No results for input(s): BNP in the last 8760 hours.  ProBNP (last 3 results) No results for input(s): PROBNP in the last 8760 hours.  CBG:  Recent Labs Lab 06/06/14 1633 06/06/14 2106 06/07/14 0138 06/07/14 0747 06/07/14 1211  GLUCAP 275* 74 260* 268* 229*    Recent Results (from the past 240 hour(s))  Blood culture (routine x 2)     Status: None (Preliminary result)   Collection Time: 06/02/14  6:35 PM  Result Value Ref Range Status   Specimen Description BLOOD LEFT FA  Final   Special Requests BOTTLES DRAWN AEROBIC AND ANAEROBIC 5CC EACH  Final   Culture   Final           BLOOD CULTURE RECEIVED NO GROWTH TO DATE CULTURE WILL BE HELD FOR 5 DAYS BEFORE  ISSUING A FINAL NEGATIVE REPORT Performed at [facility]    Report Status PENDING  Incomplete  Blood culture (routine x 2)     Status: None (Preliminary result)   Collection Time: 06/02/14  7:00 PM  Result Value Ref Range Status   Specimen Description BLOOD RIGHT AC  Final   Special Requests BOTTLES DRAWN AEROBIC AND ANAEROBIC 5 CC EACH  Final   Culture   Final           BLOOD CULTURE RECEIVED NO GROWTH TO DATE CULTURE WILL BE HELD FOR 5 DAYS BEFORE ISSUING A FINAL NEGATIVE REPORT Performed at [facility]    Report Status PENDING  Incomplete  MRSA PCR Screening     Status: None   Collection Time: 06/03/14 12:12 AM  Result Value Ref Range Status    MRSA by PCR NEGATIVE NEGATIVE Final    Comment:        The GeneXpert MRSA Assay (FDA approved for NASAL specimens only), is one component of a comprehensive MRSA colonization surveillance program. It is not intended to diagnose MRSA infection nor to guide or monitor treatment for MRSA infections.   Clostridium Difficile by PCR     Status: None   Collection Time: 06/06/14 10:12 AM  Result Value Ref Range Status   C difficile by pcr NEGATIVE NEGATIVE Final     Studies: Dg Chest 2 View  06/07/2014   CLINICAL DATA:  Cough, diabetes, hypertension, hypercholesterolemia, former smoker  EXAM: CHEST  2 VIEW  COMPARISON:  06/25/2013  FINDINGS: Upper normal heart size.  Mediastinal contours and pulmonary vascularity normal.  Minimal chronic peribronchial thickening without infiltrate, pleural effusion or pneumothorax.  Bones demineralized.  IMPRESSION: Minimal chronic bronchitic changes without acute infiltrate.   Electronically Signed   By: Lavonia Dana M.D.   On: 06/07/2014 11:44    Scheduled Meds: . benzonatate  100 mg Oral TID  . calcitRIOL  0.25 mcg Oral Daily  . ferrous sulfate  325 mg Oral Q breakfast  . folic acid  1 mg Oral Daily  . gabapentin  300 mg Oral QHS  . heparin subcutaneous  5,000 Units Subcutaneous 3 times per day  . insulin pump   Subcutaneous TID AC, HS, 0200  . metoCLOPramide (REGLAN) injection  5 mg Intravenous 3 times per day  . pantoprazole (PROTONIX) IV  40 mg Intravenous Q12H  . piperacillin-tazobactam (ZOSYN)  IV  3.375 g Intravenous 3 times per day  . pravastatin  20 mg Oral Daily  . sodium bicarbonate  650 mg Oral TID  . valACYclovir  500 mg Oral Daily  . vancomycin  750 mg Intravenous Q24H  . vitamin B-12  100 mcg Oral Daily   Continuous Infusions: . sodium chloride 75 mL/hr at 06/07/14 1103    Time spent: 25 minutes  [name]  [facility] [email or web address], password [facility] 06/07/2014, 12:30 PM  LOS: 5 days

## 2014-06-07 NOTE — Progress Notes (Signed)
Inpatient Diabetes Program Recommendations  AACE/ADA: New Consensus Statement on Inpatient Glycemic Control (2013)  Target Ranges:  Prepandial:   less than 140 mg/dL      Peak postprandial:   less than 180 mg/dL (1-2 hours)      Critically ill patients:  140 - 180 mg/dL    Results for [name], [name] (MRN [id number]) as of 06/07/2014 12:25  Ref. Range 06/07/2014 01:38 06/07/2014 07:47 06/07/2014 12:11  Glucose-Capillary Latest Range: 70-99 mg/dL 260 (H) 268 (H) 229 (H)     Spoke with patient this AM.  Patient told me she did not bolus for her CBG of 268 mg/dl this AM b/c she did not eat.  Per patient, she changed her set/site yesterday PM (03/06).  Patient remains A&O and appropriate to use insulin pump in hospital.    Will follow Wyn Quaker RN, MSN, CDE Diabetes Coordinator Inpatient Diabetes Program Team Pager: [phone] (8a-10p)

## 2014-06-07 NOTE — Progress Notes (Signed)
Per patient, she changed the site of insulin pump yesterday when they gave her a new vial of Novolog.

## 2014-06-07 NOTE — Progress Notes (Signed)
[place] for vancomycin, zosyn Indication: cellulitis /diabetic wound  Allergies  Allergen Reactions  . Ciprofloxacin Itching  . Cleocin [Clindamycin Hcl]     Diarrhea     Patient Measurements: Height: 5\' 7"  (170.2 cm) Weight: 135 lb (61.236 kg) IBW/kg (Calculated) : 61.6  Vital Signs: Temp: 98.5 F (36.9 C) (03/07 0809) Temp Source: Oral (03/07 0809) BP: 116/59 mmHg (03/07 0809) Pulse Rate: 102 (03/07 0809)  Labs:  Recent Labs  06/05/14 0450 06/06/14 1146 06/07/14 1111  WBC 10.2 16.3* 15.4*  HGB 9.0* 7.4* 9.0*  PLT 521* 476* 549*  CREATININE 2.08* 3.09* 4.05*   Estimated Creatinine Clearance: 17.5 mL/min (by C-G formula based on Cr of 4.05).  Assessment: 44 yo female c/o "blister" on right foot x 3 wk w/ pain, redness, and intermittent fevers, noted h/o osteo of left foot. Her Xray by wound care specialist PTA reportedly negative for signs of osteo in R food. She was started on only vancomycin on 3/2- received 1g in the ED. Her coverage was changed to ceftriaxone + Flagyl on 3/3 for better coverage of diabetic wounds. Wound not improving. Now to broaden to vancomycin + Zosyn  SCr up to 4.05 today, est CrCl ~17 mL/min. WBC have improved, patient is afebrile. Blood cultures are NGTD.  Goal of Therapy:  Vancomycin trough level 10-15 mcg/ml  Plan:  -Change vancomycin to 500 mg q 36 hrs - next dose needed 3/9 at 0100.  -Could consider repeat level if Scr continues to worsen. -follow clinical progression, renal function, trough at SS -Change Zosyn to 2.25g IV q 8 hrs.  Uvaldo Rising, BCPS  Clinical Pharmacist Pager [phone]  06/07/2014 3:39 PM

## 2014-06-07 NOTE — Progress Notes (Signed)
Patient has right forearm IV saline locked, clean, dry and intact.

## 2014-06-07 NOTE — Progress Notes (Signed)
CRITICAL VALUE ALERT  Critical value received:  Vanc trough Level 28.7   Date of notification:  06/07/14  Time of notification: 15:18  Critical value read back:Yes.    Nurse who received alert:  Alden Server, RN  MD notified (1st page):  Dr. Eliseo Squires and also pharmacy  Time of first page:  15:21  MD notified (2nd page): N/A  Time of second page:N/A  Responding MD:  Pharmacy responded  Time MD responded:  15:22

## 2014-06-08 ENCOUNTER — Encounter (HOSPITAL_COMMUNITY)
Admission: EM | Disposition: A | Discharge status: Home Health | Payer: Self-pay | Source: Home / Self Care | Attending: Internal Medicine

## 2014-06-08 DIAGNOSIS — N179 Acute kidney failure, unspecified: Secondary | ICD-10-CM

## 2014-06-08 DIAGNOSIS — R42 Dizziness and giddiness: Secondary | ICD-10-CM

## 2014-06-08 LAB — BASIC METABOLIC PANEL
ANION GAP: 11 (ref 5–15)
BUN: 52 mg/dL — ABNORMAL HIGH (ref 6–23)
CHLORIDE: 103 mmol/L (ref 96–112)
CO2: 18 mmol/L — AB (ref 19–32)
Calcium: 8.1 mg/dL — ABNORMAL LOW (ref 8.4–10.5)
Creatinine, Ser: 5.39 mg/dL — ABNORMAL HIGH (ref 0.50–1.10)
GFR calc Af Amer: 10 mL/min — ABNORMAL LOW (ref >90)
GFR calc non Af Amer: 9 mL/min — ABNORMAL LOW (ref >90)
Glucose, Bld: 184 mg/dL — ABNORMAL HIGH (ref 70–99)
Potassium: 4.8 mmol/L (ref 3.5–5.1)
Sodium: 132 mmol/L — ABNORMAL LOW (ref 135–145)

## 2014-06-08 LAB — GLUCOSE, CAPILLARY
GLUCOSE-CAPILLARY: 160 mg/dL — AB (ref 70–99)
GLUCOSE-CAPILLARY: 54 mg/dL — AB (ref 70–99)
Glucose-Capillary: 321 mg/dL — ABNORMAL HIGH (ref 70–99)
Glucose-Capillary: 146 mg/dL — ABNORMAL HIGH (ref 70–99)
Glucose-Capillary: 67 mg/dL — ABNORMAL LOW (ref 70–99)
Glucose-Capillary: 57 mg/dL — ABNORMAL LOW (ref 70–99)
Glucose-Capillary: 73 mg/dL (ref 70–99)
Glucose-Capillary: 226 mg/dL — ABNORMAL HIGH (ref 70–99)

## 2014-06-08 LAB — URINALYSIS, ROUTINE W REFLEX MICROSCOPIC
BILIRUBIN URINE: NEGATIVE
GLUCOSE, UA: 100 mg/dL — AB
Ketones, ur: NEGATIVE mg/dL
Leukocytes, UA: NEGATIVE
NITRITE: NEGATIVE
Protein, ur: 100 mg/dL — AB
SPECIFIC GRAVITY, URINE: 1.016 (ref 1.005–1.030)
UROBILINOGEN UA: 0.2 mg/dL (ref 0.0–1.0)
pH: 5 (ref 5.0–8.0)

## 2014-06-08 LAB — CBC
HCT: 22.9 % — ABNORMAL LOW (ref 36.0–46.0)
Hemoglobin: 7.4 g/dL — ABNORMAL LOW (ref 12.0–15.0)
MCH: 25.1 pg — ABNORMAL LOW (ref 26.0–34.0)
MCHC: 32.3 g/dL (ref 30.0–36.0)
MCV: 77.6 fL — ABNORMAL LOW (ref 78.0–100.0)
PLATELETS: 507 10*3/uL — AB (ref 150–400)
RBC: 2.95 MIL/uL — ABNORMAL LOW (ref 3.87–5.11)
RDW: 16.6 % — AB (ref 11.5–15.5)
WBC: 16 10*3/uL — AB (ref 4.0–10.5)

## 2014-06-08 LAB — URINE MICROSCOPIC-ADD ON

## 2014-06-08 LAB — PROTEIN / CREATININE RATIO, URINE
CREATININE, URINE: 71.37 mg/dL
Protein Creatinine Ratio: 2.1 — ABNORMAL HIGH (ref 0.00–0.15)
Total Protein, Urine: 150 mg/dL

## 2014-06-08 LAB — CREATININE, URINE, RANDOM: Creatinine, Urine: 72.05 mg/dL

## 2014-06-08 LAB — SODIUM, URINE, RANDOM: SODIUM UR: 41 mmol/L

## 2014-06-08 SURGERY — IRRIGATION AND DEBRIDEMENT EXTREMITY
Anesthesia: General

## 2014-06-08 MED ORDER — MIDAZOLAM HCL 2 MG/2ML IJ SOLN
INTRAMUSCULAR | Status: AC
  Filled 2014-06-08: qty 2

## 2014-06-08 MED ORDER — STERILE WATER FOR INJECTION IV SOLN
INTRAVENOUS | Status: DC
  Administered 2014-06-08 – 2014-06-11 (×4): via INTRAVENOUS
  Filled 2014-06-08 (×9): qty 850

## 2014-06-08 MED ORDER — CHLORHEXIDINE GLUCONATE 4 % EX LIQD
60.0000 mL | Freq: Once | CUTANEOUS | Status: DC
  Filled 2014-06-08: qty 60

## 2014-06-08 MED ORDER — PROPOFOL 10 MG/ML IV BOLUS
INTRAVENOUS | Status: AC
  Filled 2014-06-08: qty 20

## 2014-06-08 MED ORDER — FENTANYL CITRATE 0.05 MG/ML IJ SOLN
INTRAMUSCULAR | Status: AC
  Filled 2014-06-08: qty 5

## 2014-06-08 MED ORDER — DEXTROSE 50 % IV SOLN
INTRAVENOUS | Status: AC
  Filled 2014-06-08: qty 50

## 2014-06-08 SURGICAL SUPPLY — 38 items
BLADE SURG 10 STRL SS (BLADE) IMPLANT
BNDG COHESIVE 4X5 TAN STRL (GAUZE/BANDAGES/DRESSINGS) IMPLANT
BNDG COHESIVE 6X5 TAN STRL LF (GAUZE/BANDAGES/DRESSINGS) IMPLANT
COVER SURGICAL LIGHT HANDLE (MISCELLANEOUS) ×3 IMPLANT
CUFF TOURNIQUET SINGLE 18IN (TOURNIQUET CUFF) IMPLANT
CUFF TOURNIQUET SINGLE 24IN (TOURNIQUET CUFF) IMPLANT
CUFF TOURNIQUET SINGLE 34IN LL (TOURNIQUET CUFF) IMPLANT
CUFF TOURNIQUET SINGLE 44IN (TOURNIQUET CUFF) IMPLANT
DRAPE U-SHAPE 47X51 STRL (DRAPES) ×3 IMPLANT
DRSG ADAPTIC 3X8 NADH LF (GAUZE/BANDAGES/DRESSINGS) ×3 IMPLANT
DURAPREP 26ML APPLICATOR (WOUND CARE) ×3 IMPLANT
ELECT CAUTERY BLADE 6.4 (BLADE) IMPLANT
ELECT REM PT RETURN 9FT ADLT (ELECTROSURGICAL)
ELECTRODE REM PT RTRN 9FT ADLT (ELECTROSURGICAL) IMPLANT
GAUZE SPONGE 4X4 12PLY STRL (GAUZE/BANDAGES/DRESSINGS) ×3 IMPLANT
GLOVE BIOGEL PI IND STRL 9 (GLOVE) ×2 IMPLANT
GLOVE BIOGEL PI INDICATOR 9 (GLOVE) ×1
GLOVE SURG ORTHO 9.0 STRL STRW (GLOVE) ×3 IMPLANT
GOWN STRL REUS W/ TWL XL LVL3 (GOWN DISPOSABLE) ×4 IMPLANT
GOWN STRL REUS W/TWL XL LVL3 (GOWN DISPOSABLE) ×4
HANDPIECE INTERPULSE COAX TIP (DISPOSABLE)
KIT BASIN OR (CUSTOM PROCEDURE TRAY) ×3 IMPLANT
KIT ROOM TURNOVER OR (KITS) ×3 IMPLANT
MANIFOLD NEPTUNE II (INSTRUMENTS) ×3 IMPLANT
NS IRRIG 1000ML POUR BTL (IV SOLUTION) ×3 IMPLANT
PACK ORTHO EXTREMITY (CUSTOM PROCEDURE TRAY) ×3 IMPLANT
PAD ARMBOARD 7.5X6 YLW CONV (MISCELLANEOUS) ×6 IMPLANT
PADDING CAST COTTON 6X4 STRL (CAST SUPPLIES) ×3 IMPLANT
SET HNDPC FAN SPRY TIP SCT (DISPOSABLE) IMPLANT
SPONGE LAP 18X18 X RAY DECT (DISPOSABLE) ×3 IMPLANT
STOCKINETTE IMPERVIOUS 9X36 MD (GAUZE/BANDAGES/DRESSINGS) IMPLANT
TOWEL OR 17X24 6PK STRL BLUE (TOWEL DISPOSABLE) ×3 IMPLANT
TOWEL OR 17X26 10 PK STRL BLUE (TOWEL DISPOSABLE) ×3 IMPLANT
TUBE ANAEROBIC SPECIMEN COL (MISCELLANEOUS) IMPLANT
TUBE CONNECTING 12X1/4 (SUCTIONS) ×3 IMPLANT
UNDERPAD 30X30 INCONTINENT (UNDERPADS AND DIAPERS) ×3 IMPLANT
WATER STERILE IRR 1000ML POUR (IV SOLUTION) ×3 IMPLANT
YANKAUER SUCT BULB TIP NO VENT (SUCTIONS) ×3 IMPLANT

## 2014-06-08 NOTE — Progress Notes (Signed)
[facility] PROGRESS NOTE  PHANTASIA DAJANI [id number] DOB: 05/05/1970 DOA: 06/02/2014 PCP: No primary care provider on file.  Assessment/Plan:    Cellulitis of right foot: MRI without osteomyelitis or deep abscess. Asked Dr. 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- watch vanc and start IVF, consult renal   DM type 1 causing renal disease  prob diabetic gastroparesis- IV reglan Chronic anemia: resume iron, given dose of aranesp -routine check of HIV antibody negative  HPI/Subjective: Worried about surgery  Objective: Filed Vitals:   06/08/14 1000  BP: 127/53  Pulse: 86  Temp: 98.6 F (37 C)  Resp: 18    Intake/Output Summary (Last 24 hours) at 06/08/14 1222 Last data filed at 06/08/14 0900  Gross per 24 hour  Intake 2311.25 ml  Output      0 ml  Net 2311.25 ml   Filed Weights   06/05/14 2042 06/06/14 2114 06/07/14 2100  Weight: 60.328 kg (133 lb) 61.236 kg (135 lb) 61.5 kg (135 lb 9.3 oz)    Exam:   General:  Poor insight into disease process  HEENT: dry appearing  Cardiovascular: rrr without mgr  Respiratory: cta without wrr  Abdomen: s, nt, nd  Ext: left foot with chronic plantar ulcer, no drainage or erythema.  Right plantar surface with blister midfoot, extending, erythema, and boggy induration  No odor, sero-sangenous drainage  Basic Metabolic Panel:  Recent Labs Lab 06/04/14 0520 06/05/14 0450 06/06/14 1146 06/07/14 1111 06/08/14 0805  NA 133* 132* 131* 128* 132*  K 5.0 4.9 5.1 4.9 4.8  CL 111 103 102 101 103  CO2 13* 15* 18* 17* 18*  GLUCOSE 189* 186* 240* 254* 184*  BUN 32* 35* 40* 45* 52*  CREATININE 1.96* 2.08* 3.09* 4.05* 5.39*  CALCIUM 7.9*  8.3* 7.9* 8.1* 8.1*   Liver Function Tests: No results for input(s): AST, ALT, ALKPHOS, BILITOT, PROT, ALBUMIN in the last 168 hours. No results for input(s): LIPASE, AMYLASE in the last 168 hours. No results for input(s): AMMONIA in the last 168 hours. CBC:  Recent Labs Lab 06/04/14 0520 06/05/14 0450 06/06/14 1146 06/07/14 1111 06/08/14 0805  WBC 9.6 10.2 16.3* 15.4* 16.0*  HGB 8.0* 9.0* 7.4* 9.0* 7.4*  HCT 25.7* 28.4* 23.1* 27.4* 22.9*  MCV 80.3 79.3 80.2 76.1* 77.6*  PLT 481* 521* 476* 549* 507*   Cardiac Enzymes: No results for input(s): CKTOTAL, CKMB, CKMBINDEX, TROPONINI in the last 168 hours. BNP (last 3 results) No results for input(s): BNP in the last 8760 hours.  ProBNP (last 3 results) No results for input(s): PROBNP in the last 8760 hours.  CBG:  Recent Labs Lab 06/07/14 1211 06/07/14 1633 06/07/14 2204 06/08/14 0323 06/08/14 1135  GLUCAP 229* 275* 257* 321* 146*    Recent Results (from the past 240 hour(s))  Blood culture (routine x 2)     Status: None (Preliminary result)   Collection Time: 06/02/14  6:35 PM  Result Value Ref Range Status   Specimen Description BLOOD LEFT FA  Final   Special Requests BOTTLES DRAWN AEROBIC AND ANAEROBIC 5CC EACH  Final   Culture   Final           BLOOD CULTURE RECEIVED NO GROWTH TO DATE  CULTURE WILL BE HELD FOR 5 DAYS BEFORE ISSUING A FINAL NEGATIVE REPORT Performed at [facility]    Report Status PENDING  Incomplete  Blood culture (routine x 2)     Status: None (Preliminary result)   Collection Time: 06/02/14  7:00 PM  Result Value Ref Range Status   Specimen Description BLOOD RIGHT AC  Final   Special Requests BOTTLES DRAWN AEROBIC AND ANAEROBIC 5 CC EACH  Final   Culture   Final           BLOOD CULTURE RECEIVED NO GROWTH TO DATE CULTURE WILL BE HELD FOR 5 DAYS BEFORE ISSUING A FINAL NEGATIVE REPORT Performed at [facility]    Report Status PENDING  Incomplete  MRSA PCR Screening      Status: None   Collection Time: 06/03/14 12:12 AM  Result Value Ref Range Status   MRSA by PCR NEGATIVE NEGATIVE Final    Comment:        The GeneXpert MRSA Assay (FDA approved for NASAL specimens only), is one component of a comprehensive MRSA colonization surveillance program. It is not intended to diagnose MRSA infection nor to guide or monitor treatment for MRSA infections.   Clostridium Difficile by PCR     Status: None   Collection Time: 06/06/14 10:12 AM  Result Value Ref Range Status   C difficile by pcr NEGATIVE NEGATIVE Final     Studies: Dg Chest 2 View  06/07/2014   CLINICAL DATA:  Cough, diabetes, hypertension, hypercholesterolemia, former smoker  EXAM: CHEST  2 VIEW  COMPARISON:  06/25/2013  FINDINGS: Upper normal heart size.  Mediastinal contours and pulmonary vascularity normal.  Minimal chronic peribronchial thickening without infiltrate, pleural effusion or pneumothorax.  Bones demineralized.  IMPRESSION: Minimal chronic bronchitic changes without acute infiltrate.   Electronically Signed   By: Lavonia Dana M.D.   On: 06/07/2014 11:44    Scheduled Meds: . benzonatate  100 mg Oral TID  . calcitRIOL  0.25 mcg Oral Daily  . ferrous sulfate  325 mg Oral Q breakfast  . folic acid  1 mg Oral Daily  . gabapentin  300 mg Oral QHS  . heparin subcutaneous  5,000 Units Subcutaneous 3 times per day  . insulin pump   Subcutaneous TID AC, HS, 0200  . metoCLOPramide (REGLAN) injection  5 mg Intravenous 3 times per day  . pantoprazole (PROTONIX) IV  40 mg Intravenous Q12H  . piperacillin-tazobactam (ZOSYN)  IV  2.25 g Intravenous 3 times per day  . pravastatin  20 mg Oral Daily  . sodium bicarbonate  650 mg Oral TID  . valACYclovir  500 mg Oral Daily  . vitamin B-12  100 mcg Oral Daily   Continuous Infusions: . sodium chloride 75 mL/hr at 06/07/14 1103    Time spent: 25 minutes  VANN, JESSICA  [facility] [email or web address], password [facility] 06/08/2014, 12:22 PM  LOS:  6 days

## 2014-06-08 NOTE — Progress Notes (Signed)
Called by the nurse about the patient's refusal to go on her scheduled procedure this afternoon,spoke and asked the patient why and how she felt about her scheduled procedure.Patient said" i am petrified with this surgeon and i don't want to do this with that doctor.Nurse clarified to her and explained to her that her treatment would be delayed.Patient said that '' i don't feel it right on him,am i don't have the right to choose my doctor"?. At this moment, i asked the patient if she has a preferred surgeon in her mind and she told this nurse the name of her preferred surgeon.Primary M.D. paged and informed.

## 2014-06-08 NOTE — Progress Notes (Signed)
I was notified that patient requested Dr Doran Durand consult for second opinion.  I just cancelled surgery.  Dr Fredonia Highland was just consulted and I told him the patient requested Dr Doran Durand for consultation.  I will follow up if consulted again.  I feel the patients abscess does require urgent debridement.

## 2014-06-08 NOTE — Progress Notes (Addendum)
Inpatient Diabetes Program Recommendations  AACE/ADA: New Consensus Statement on Inpatient Glycemic Control (2013)  Target Ranges:  Prepandial:   less than 140 mg/dL      Peak postprandial:   less than 180 mg/dL (1-2 hours)      Critically ill patients:  140 - 180 mg/dL     Results for [name], [name] (MRN [id number]) as of 06/08/2014 12:53  Ref. Range 06/08/2014 03:23 06/08/2014 08:40 06/08/2014 11:35  Glucose-Capillary Latest Range: 70-99 mg/dL 321 (H) 160 (H) 146 (H)     Patient remains A&O and appropriate to use insulin pump in the hospital.  Per RN, patient not doing a great job communicating about insulin boluses she is giving herself with her insulin pump.  Encouraged patient to try to communicate better with her nurse about insulin boluses.  Gave RN a flowsheet to give to patient to document insulin boluses.  Note patient to go to OR today for I&D of foot.  Asked patient what she plans to do with her pump during surgery.  Patient stated she would be fine off of her pump during her surgery and plans to put her pump back on immediately after surgery.   Will follow Wyn Quaker RN, MSN, CDE Diabetes Coordinator Inpatient Diabetes Program Team Pager: [phone] (8a-10p)

## 2014-06-08 NOTE — Significant Event (Signed)
Pt 'NPO' most of shift due to scheduled procedure which pt cancelled (see prior notes). NT reported that pt's blood glucose was 67@1647 . 1/2 can of sprite given, which pt did not consume completely. On recheck BG 57. Pt requested cranberry juice. 4oz of cranberry juice proved. On recheck pt's blood glucose 54. Only 38ml of 50% Dextrose solution given, as pt stated 'my arm is cramping'. She later stated, "I think it was because the solution was cold'  On recheck BG 73. Pt asymptomatic during hypoglycemic episode.

## 2014-06-08 NOTE — Progress Notes (Signed)
At [place] pt notified nurse that she did not want Dr. Sharol Given to perform I&D procedure which was scheduled at 1700. Pt requested that Dr. Doran Durand be consulted for procedure. Dr. Sharol Given contacted at [phone] verbalized understanding, and stated that he would contact the OR to cancel scheduled procedure. Message also sent via Amion to attending physican Dr. Eliseo Squires to advise of pt's decision to have Dr. Doran Durand perform I&D procedure in lieu of Dr. Sharol Given.

## 2014-06-08 NOTE — Consult Note (Signed)
Reason for Consult: Abscess right foot Referring Physician: Dr. [name] is an 44 y.o. female.  HPI: Patient is a 44 year old woman diabetic neuropathy with ulcer on the left foot and 3 week history of cellulitis and pain on the plantar aspect of the right foot. Patient is currently seen at the [facility] wound center  Past Medical History  Diagnosis Date  . Diabetes mellitus   . Neuropathy in diabetes   . Hypertension   . Hypercholesteremia   . H/O seasonal allergies   . Anemia   . Left foot infection     Past Surgical History  Procedure Laterality Date  . Cesarean section    . Eye surgery    . Hemrrhoidectomy    . Peripherally inserted central catheter insertion      History reviewed. No pertinent family history.  Social History:  reports that she quit smoking about 6 years ago. She has never used smokeless tobacco. She reports that she drinks about 0.5 oz of alcohol per week. She reports that she does not use illicit drugs.  Allergies:  Allergies  Allergen Reactions  . Ciprofloxacin Itching  . Cleocin [Clindamycin Hcl]     Diarrhea     Medications: I have reviewed the patient's current medications.  Results for orders placed or performed during the hospital encounter of 06/02/14 (from the past 48 hour(s))  Glucose, capillary     Status: Abnormal   Collection Time: 06/06/14  7:58 AM  Result Value Ref Range   Glucose-Capillary 178 (H) 70 - 99 mg/dL  Clostridium Difficile by PCR     Status: None   Collection Time: 06/06/14 10:12 AM  Result Value Ref Range   C difficile by pcr NEGATIVE NEGATIVE  Glucose, capillary     Status: Abnormal   Collection Time: 06/06/14 11:34 AM  Result Value Ref Range   Glucose-Capillary 233 (H) 70 - 99 mg/dL  CBC     Status: Abnormal   Collection Time: 06/06/14 11:46 AM  Result Value Ref Range   WBC 16.3 (H) 4.0 - 10.5 K/uL   RBC 2.88 (L) 3.87 - 5.11 MIL/uL   Hemoglobin 7.4 (L) 12.0 - 15.0 g/dL    Comment: REPEATED  TO VERIFY DELTA CHECK NOTED    HCT 23.1 (L) 36.0 - 46.0 %   MCV 80.2 78.0 - 100.0 fL   MCH 25.7 (L) 26.0 - 34.0 pg   MCHC 32.0 30.0 - 36.0 g/dL   RDW 16.5 (H) 11.5 - 15.5 %   Platelets 476 (H) 150 - 400 K/uL  Basic metabolic panel     Status: Abnormal   Collection Time: 06/06/14 11:46 AM  Result Value Ref Range   Sodium 131 (L) 135 - 145 mmol/L   Potassium 5.1 3.5 - 5.1 mmol/L   Chloride 102 96 - 112 mmol/L   CO2 18 (L) 19 - 32 mmol/L   Glucose, Bld 240 (H) 70 - 99 mg/dL   BUN 40 (H) 6 - 23 mg/dL   Creatinine, Ser 3.09 (H) 0.50 - 1.10 mg/dL   Calcium 7.9 (L) 8.4 - 10.5 mg/dL   GFR calc non Af Amer 18 (L) >90 mL/min   GFR calc Af Amer 20 (L) >90 mL/min    Comment: (NOTE) The eGFR has been calculated using the CKD EPI equation. This calculation has not been validated in all clinical situations. eGFR's persistently <90 mL/min signify possible Chronic Kidney Disease.    Anion gap 11 5 - 15  HIV antibody     Status: None   Collection Time: 06/06/14 11:46 AM  Result Value Ref Range   HIV Screen 4th Generation wRfx Non Reactive Non Reactive    Comment: (NOTE) Performed At: [facility] [place], [place] [id number] Lindon Romp MD LS:[id number]   Glucose, capillary     Status: Abnormal   Collection Time: 06/06/14  4:33 PM  Result Value Ref Range   Glucose-Capillary 275 (H) 70 - 99 mg/dL  Glucose, capillary     Status: None   Collection Time: 06/06/14  9:06 PM  Result Value Ref Range   Glucose-Capillary 74 70 - 99 mg/dL  Glucose, capillary     Status: Abnormal   Collection Time: 06/07/14  1:38 AM  Result Value Ref Range   Glucose-Capillary 260 (H) 70 - 99 mg/dL  Glucose, capillary     Status: Abnormal   Collection Time: 06/07/14  7:47 AM  Result Value Ref Range   Glucose-Capillary 268 (H) 70 - 99 mg/dL  CBC     Status: Abnormal   Collection Time: 06/07/14 11:11 AM  Result Value Ref Range   WBC 15.4 (H) 4.0 - 10.5 K/uL   RBC 3.60 (L) 3.87 -  5.11 MIL/uL   Hemoglobin 9.0 (L) 12.0 - 15.0 g/dL    Comment: REPEATED TO VERIFY   HCT 27.4 (L) 36.0 - 46.0 %   MCV 76.1 (L) 78.0 - 100.0 fL   MCH 25.0 (L) 26.0 - 34.0 pg   MCHC 32.8 30.0 - 36.0 g/dL   RDW 16.4 (H) 11.5 - 15.5 %   Platelets 549 (H) 150 - 400 K/uL  Basic metabolic panel     Status: Abnormal   Collection Time: 06/07/14 11:11 AM  Result Value Ref Range   Sodium 128 (L) 135 - 145 mmol/L   Potassium 4.9 3.5 - 5.1 mmol/L   Chloride 101 96 - 112 mmol/L   CO2 17 (L) 19 - 32 mmol/L   Glucose, Bld 254 (H) 70 - 99 mg/dL   BUN 45 (H) 6 - 23 mg/dL   Creatinine, Ser 4.05 (H) 0.50 - 1.10 mg/dL   Calcium 8.1 (L) 8.4 - 10.5 mg/dL   GFR calc non Af Amer 13 (L) >90 mL/min   GFR calc Af Amer 15 (L) >90 mL/min    Comment: (NOTE) The eGFR has been calculated using the CKD EPI equation. This calculation has not been validated in all clinical situations. eGFR's persistently <90 mL/min signify possible Chronic Kidney Disease.    Anion gap 10 5 - 15  Glucose, capillary     Status: Abnormal   Collection Time: 06/07/14 12:11 PM  Result Value Ref Range   Glucose-Capillary 229 (H) 70 - 99 mg/dL  Vancomycin, trough     Status: Abnormal   Collection Time: 06/07/14  1:48 PM  Result Value Ref Range   Vancomycin Tr 28.7 (HH) 10.0 - 20.0 ug/mL    Comment: REPEATED TO VERIFY CRITICAL RESULT CALLED TO, READ BACK BY AND VERIFIED WITH: J NARAMDAS,RN 1516 06/07/14 WBOND   Glucose, capillary     Status: Abnormal   Collection Time: 06/07/14  4:33 PM  Result Value Ref Range   Glucose-Capillary 275 (H) 70 - 99 mg/dL  Glucose, capillary     Status: Abnormal   Collection Time: 06/07/14 10:04 PM  Result Value Ref Range   Glucose-Capillary 257 (H) 70 - 99 mg/dL  Glucose, capillary     Status: Abnormal   Collection  Time: 06/08/14  3:23 AM  Result Value Ref Range   Glucose-Capillary 321 (H) 70 - 99 mg/dL    Dg Chest 2 View  06/07/2014   CLINICAL DATA:  Cough, diabetes, hypertension,  hypercholesterolemia, former smoker  EXAM: CHEST  2 VIEW  COMPARISON:  06/25/2013  FINDINGS: Upper normal heart size.  Mediastinal contours and pulmonary vascularity normal.  Minimal chronic peribronchial thickening without infiltrate, pleural effusion or pneumothorax.  Bones demineralized.  IMPRESSION: Minimal chronic bronchitic changes without acute infiltrate.   Electronically Signed   By: Lavonia Dana M.D.   On: 06/07/2014 11:44    Review of Systems  All other systems reviewed and are negative.  Blood pressure 139/70, pulse 91, temperature 100.1 F (37.8 C), temperature source Oral, resp. rate 18, height $RemoveBe'5\' 7"'guywjdpAt$  (1.702 m), weight 61.5 kg (135 lb 9.3 oz), last menstrual period 05/21/2014, SpO2 100 %. Physical Exam On examination patient has a Wagner grade 1 ulcer on the plantar aspect left foot there is no redness no cellulitis this does not probe to bone. On the plantar aspect the right foot patient has a fluctuant abscess on the plantar aspect of her foot with cellulitis tenderness to palpation she states the symptoms are better since she's been on antibiotics. Assessment/Plan: Assessment: Abscess plantar aspect right foot.  Plan: We will plan for irrigation and debridement of the right foot abscess as add-on surgery today, approximately 5 PM. Nothing by mouth.  Jeriko Kowalke V 06/08/2014, 6:50 AM

## 2014-06-08 NOTE — Consult Note (Signed)
Reason for Consult: Acute renal failure on chronic kidney disease stage III Referring Physician: Eulogio Bear DO [facility])  HPI:  44 year old Caucasian woman with past medical history significant for type 1 diabetes with associated retinopathy, neuropathy and chronic kidney disease stage III. 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 She has had a chronic left lower extremity wound and was admitted with a 3 week history of cellulitis and pain of the right foot-MRI negative for osteomyelitis/deep abscess was  When seen in her initial consultation 2 months ago by Dr. Justin Mend (nephrology) her creatinine was 1.25. Prior to admission, she was taking an ARB (losartan) as well as Bactrim DS for cellulitis-both of these were stopped upon admission. Concern is raised with her rising creatinine-unfortunately urine output is not charted. She was on vancomycin and her trough yesterday was elevated at 28.7. She has not had any intravenous contrast exposure. She has been having intermittent fevers and chills since admission-blood pressures fair without any critical hypotensive episodes.    06/04/2014  06/05/2014  06/06/2014  06/07/2014  06/08/2014   BUN 32 (H) 35 (H) 40 (H) 45 (H) 52 (H)  Creatinine 1.96 (H) 2.08 (H) 3.09 (H) 4.05 (H) 5.39 (H)    Past Medical History  Diagnosis Date  . Diabetes mellitus   . Neuropathy in diabetes   . Hypertension   . Hypercholesteremia   . H/O seasonal allergies   . Anemia   . Left foot infection     Past Surgical History  Procedure Laterality Date  . Cesarean section    . Eye surgery    . Hemrrhoidectomy    . Peripherally inserted central catheter insertion      History reviewed. No pertinent family history.  Social History:  reports that she quit smoking about 6 years ago. She has never used smokeless tobacco. She reports that she drinks about 0.5 oz of alcohol per week. She reports that she does not use illicit drugs.  Allergies:   Allergies  Allergen Reactions  . Ciprofloxacin Itching  . Cleocin [Clindamycin Hcl]     Diarrhea     Medications:  Scheduled: . benzonatate  100 mg Oral TID  . calcitRIOL  0.25 mcg Oral Daily  . ferrous sulfate  325 mg Oral Q breakfast  . folic acid  1 mg Oral Daily  . gabapentin  300 mg Oral QHS  . heparin subcutaneous  5,000 Units Subcutaneous 3 times per day  . insulin pump   Subcutaneous TID AC, HS, 0200  . metoCLOPramide (REGLAN) injection  5 mg Intravenous 3 times per day  . pantoprazole (PROTONIX) IV  40 mg Intravenous Q12H  . piperacillin-tazobactam (ZOSYN)  IV  2.25 g Intravenous 3 times per day  . pravastatin  20 mg Oral Daily  . sodium bicarbonate  650 mg Oral TID  . valACYclovir  500 mg Oral Daily  . vitamin B-12  100 mcg Oral Daily    BMP Latest Ref Rng 06/08/2014 06/07/2014 06/06/2014  Glucose 70 - 99 mg/dL 184(H) 254(H) 240(H)  BUN 6 - 23 mg/dL 52(H) 45(H) 40(H)  Creatinine 0.50 - 1.10 mg/dL 5.39(H) 4.05(H) 3.09(H)  Sodium 135 - 145 mmol/L 132(L) 128(L) 131(L)  Potassium 3.5 - 5.1 mmol/L 4.8 4.9 5.1  Chloride 96 - 112 mmol/L 103 101 102  CO2 19 - 32 mmol/L 18(L) 17(L) 18(L)  Calcium 8.4 - 10.5 mg/dL 8.1(L) 8.1(L) 7.9(L)   CBC Latest Ref Rng 06/08/2014 06/07/2014 06/06/2014  WBC 4.0 -  10.5 K/uL 16.0(H) 15.4(H) 16.3(H)  Hemoglobin 12.0 - 15.0 g/dL 7.4(L) 9.0(L) 7.4(L)  Hematocrit 36.0 - 46.0 % 22.9(L) 27.4(L) 23.1(L)  Platelets 150 - 400 K/uL 507(H) 549(H) 476(H)     Dg Chest 2 View  06/07/2014   CLINICAL DATA:  Cough, diabetes, hypertension, hypercholesterolemia, former smoker  EXAM: CHEST  2 VIEW  COMPARISON:  06/25/2013  FINDINGS: Upper normal heart size.  Mediastinal contours and pulmonary vascularity normal.  Minimal chronic peribronchial thickening without infiltrate, pleural effusion or pneumothorax.  Bones demineralized.  IMPRESSION: Minimal chronic bronchitic changes without acute infiltrate.   Electronically Signed   By: Lavonia Dana M.D.   On: 06/07/2014 11:44     Review of Systems  Constitutional: Positive for fever and chills. Negative for weight loss, malaise/fatigue and diaphoresis.  HENT: Positive for congestion and sore throat. Negative for ear discharge, ear pain, hearing loss and nosebleeds.   Eyes: Negative.   Respiratory: Positive for cough. Negative for hemoptysis, sputum production and wheezing.        Dry cough  Cardiovascular: Negative for chest pain, palpitations and leg swelling.  Gastrointestinal: Positive for heartburn, nausea and diarrhea. Negative for vomiting, constipation and blood in stool.       Diarrhea with fecal incontinence  Genitourinary: Negative.   Musculoskeletal: Negative.   Skin: Negative.   Neurological: Positive for headaches. Negative for dizziness, sensory change and speech change.  Endo/Heme/Allergies: Negative.   Psychiatric/Behavioral: Negative.   All other systems reviewed and are negative.  Blood pressure 127/53, pulse 86, temperature 98.6 F (37 C), temperature source Oral, resp. rate 18, height 5\' 7"  (1.702 m), weight 61.5 kg (135 lb 9.3 oz), last menstrual period 05/21/2014, SpO2 96 %. Physical Exam  Nursing note and vitals reviewed. Constitutional: She is oriented to person, place, and time. She appears well-developed and well-nourished. No distress.  HENT:  Head: Normocephalic and atraumatic.  Mouth/Throat: Oropharynx is clear and moist.  Eyes: Conjunctivae and EOM are normal. Pupils are equal, round, and reactive to light. No scleral icterus.  Neck: Normal range of motion. Neck supple. No JVD present. No tracheal deviation present.  Cardiovascular: Normal rate, regular rhythm and normal heart sounds.   Respiratory: Effort normal. No respiratory distress. She has wheezes. She has no rales.  Intermittent expiratory wheezing  GI: Soft. Bowel sounds are normal. She exhibits no distension. There is no tenderness. There is no rebound.  Musculoskeletal: She exhibits no edema.  Neurological: She is  alert and oriented to person, place, and time.  Skin: Skin is warm and dry.  Improving cellulitis right lower extremity  Psychiatric: She has a normal mood and affect. Her behavior is normal.    Assessment/Plan: 1. Acute renal failure on chronic kidney disease stage III: This is suspected to be multifactorial from possibly nephrotoxic ATN (low likelihood of vancomycin), SIRS associated ATN or postinfectious GN. This is all in the background of diabetic kidney disease. I will check a urinalysis, urine electrolytes and renal ultrasound today together with complement levels and an ASO titer. Electrolytes appear to be stable and there are no acute indications for dialysis at this time. She appears to be euvolemic to mildly hypokalemic on physical exam. She is agreeable to placement of a Foley catheter for strict monitoring of urinary output-hopefully we can limit this to less than 72 hours. Agree with trial of intravenous fluids-would favor isotonic sodium bicarbonate. 2. Right lower extremity cellulitis: Awaiting further evaluation/management by Dr. Sharol Given following results of MRI. 3. Hyponatremia: Pseudohyponatremia secondary  to hyperglycemia and true hyponatremia secondary to acute renal failure and defective free water excretion-we'll start intravenous fluids 4. Hyperkalemia: Mild, monitor with intravenous fluids/improved urine output. 5. Anemia: Appears to be from underlying chronic kidney disease and currently with erythropoietin resistance secondary to infection, will check iron studies-deficiency noted one month ago.  [name] K. 06/08/2014, 1:22 PM

## 2014-06-08 NOTE — Progress Notes (Signed)
VASCULAR LAB PRELIMINARY  ARTERIAL  ABI completed:  Waveforms normal.  ABIs not accurate due to falsely elevated pressures.    RIGHT    LEFT    PRESSURE WAVEFORM  PRESSURE WAVEFORM  BRACHIAL 136 Triphasic  BRACHIAL 128 Triphasic   DP 173 Triphasic  DP 197 Triphasic   AT   AT    PT 165 Triphasic  PT 153 Triphasic   PER   PER    GREAT TOE  NA GREAT TOE  NA    RIGHT LEFT  ABI 1.27 1.45     Stanton Kissoon, RVT 06/08/2014, 11:51 AM

## 2014-06-09 ENCOUNTER — Ambulatory Visit (HOSPITAL_COMMUNITY): Payer: Medicaid Other

## 2014-06-09 LAB — CULTURE, BLOOD (ROUTINE X 2)
CULTURE: NO GROWTH
Culture: NO GROWTH

## 2014-06-09 LAB — IRON AND TIBC
Iron: <10 ug/dL — ABNORMAL LOW (ref 42–145)
Iron: <10 ug/dL — ABNORMAL LOW (ref 42–145)
UIBC: 230 ug/dL (ref 125–400)
UIBC: 214 ug/dL (ref 125–400)

## 2014-06-09 LAB — BASIC METABOLIC PANEL
ANION GAP: 12 (ref 5–15)
BUN: 56 mg/dL — ABNORMAL HIGH (ref 6–23)
CALCIUM: 8 mg/dL — AB (ref 8.4–10.5)
CO2: 19 mmol/L (ref 19–32)
Chloride: 101 mmol/L (ref 96–112)
Creatinine, Ser: 5.85 mg/dL — ABNORMAL HIGH (ref 0.50–1.10)
GFR calc Af Amer: 9 mL/min — ABNORMAL LOW (ref >90)
GFR calc non Af Amer: 8 mL/min — ABNORMAL LOW (ref >90)
Glucose, Bld: 168 mg/dL — ABNORMAL HIGH (ref 70–99)
Potassium: 4.8 mmol/L (ref 3.5–5.1)
Sodium: 132 mmol/L — ABNORMAL LOW (ref 135–145)

## 2014-06-09 LAB — GLUCOSE, CAPILLARY
Glucose-Capillary: 141 mg/dL — ABNORMAL HIGH (ref 70–99)
Glucose-Capillary: 111 mg/dL — ABNORMAL HIGH (ref 70–99)
Glucose-Capillary: 135 mg/dL — ABNORMAL HIGH (ref 70–99)
Glucose-Capillary: 170 mg/dL — ABNORMAL HIGH (ref 70–99)

## 2014-06-09 LAB — CBC
HCT: 23 % — ABNORMAL LOW (ref 36.0–46.0)
Hemoglobin: 7.3 g/dL — ABNORMAL LOW (ref 12.0–15.0)
MCH: 25.3 pg — ABNORMAL LOW (ref 26.0–34.0)
MCHC: 31.7 g/dL (ref 30.0–36.0)
MCV: 79.9 fL (ref 78.0–100.0)
Platelets: 431 10*3/uL — ABNORMAL HIGH (ref 150–400)
RBC: 2.88 MIL/uL — ABNORMAL LOW (ref 3.87–5.11)
RDW: 16.8 % — ABNORMAL HIGH (ref 11.5–15.5)
WBC: 11.5 10*3/uL — AB (ref 4.0–10.5)

## 2014-06-09 LAB — VANCOMYCIN, RANDOM: Vancomycin Rm: 20.9 ug/mL

## 2014-06-09 LAB — FERRITIN: FERRITIN: 52 ng/mL (ref 10–291)

## 2014-06-09 MED ORDER — DARBEPOETIN ALFA 60 MCG/0.3ML IJ SOSY: 60.0000 ug | PREFILLED_SYRINGE | INTRAMUSCULAR | Status: DC

## 2014-06-09 MED ORDER — DARBEPOETIN ALFA 60 MCG/0.3ML IJ SOSY
60.0000 ug | PREFILLED_SYRINGE | INTRAMUSCULAR | Status: DC
  Filled 2014-06-09: qty 0.3

## 2014-06-09 MED ORDER — PANTOPRAZOLE SODIUM 40 MG PO TBEC
40.0000 mg | DELAYED_RELEASE_TABLET | Freq: Every day | ORAL | Status: DC
Start: 2014-06-10 — End: 2014-06-18
  Administered 2014-06-10 – 2014-06-18 (×8): 40 mg via ORAL
  Filled 2014-06-09 (×6): qty 1

## 2014-06-09 MED ORDER — DARBEPOETIN ALFA 60 MCG/0.3ML IJ SOSY
60.0000 ug | PREFILLED_SYRINGE | INTRAMUSCULAR | Status: DC
Start: 2014-06-10 — End: 2014-06-11
  Filled 2014-06-09: qty 0.3

## 2014-06-09 NOTE — Progress Notes (Signed)
[facility] PROGRESS NOTE  [name] [id number] DOB: 27-Jul-1970 DOA: 06/02/2014 PCP: No primary care provider on file.  Assessment/Plan:    Cellulitis of right foot: MRI without osteomyelitis or deep abscess. ABIs done-- needs irrigation and debridement of the right foot abscess - patient refused Dr. Sharol Given- request Doran Durand but he is out of town,  Asked Dr. 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- consult renal   DM type 1 causing renal disease  prob diabetic gastroparesis- IV reglan Chronic anemia: resume iron, given dose of aranesp -routine check of HIV antibody negative  HPI/Subjective: Patient was "digging" into area on bottom of foot with object when I walked in, says she was taking dead skin off   Objective: Filed Vitals:   06/09/14 1000  BP: 134/65  Pulse: 77  Temp: 98.8 F (37.1 C)  Resp: 18    Intake/Output Summary (Last 24 hours) at 06/09/14 1342 Last data filed at 06/09/14 0900  Gross per 24 hour  Intake 1326.25 ml  Output   1100 ml  Net 226.25 ml   Filed Weights   06/06/14 2114 06/07/14 2100 06/08/14 2100  Weight: 61.236 kg (135 lb) 61.5 kg (135 lb 9.3 oz) 62.823 kg (138 lb 8 oz)    Exam:   General:  Poor insight into disease process  Cardiovascular: rrr without mgr  Respiratory: cta without wrr  Abdomen: s, nt, nd  Ext:  Right plantar surface with blister midfoot, extending, erythema, and boggy indurationlessened  No odor, sero-sangenous drainage  Basic Metabolic Panel:  Recent Labs Lab 06/05/14 0450 06/06/14 1146 06/07/14 1111 06/08/14 0805 06/09/14 0555  NA 132* 131* 128* 132* 132*  K 4.9 5.1 4.9 4.8 4.8  CL 103 102 101 103 101  CO2 15* 18* 17* 18* 19  GLUCOSE 186* 240* 254* 184* 168*  BUN 35* 40* 45* 52* 56*  CREATININE  2.08* 3.09* 4.05* 5.39* 5.85*  CALCIUM 8.3* 7.9* 8.1* 8.1* 8.0*   Liver Function Tests: No results for input(s): AST, ALT, ALKPHOS, BILITOT, PROT, ALBUMIN in the last 168 hours. No results for input(s): LIPASE, AMYLASE in the last 168 hours. No results for input(s): AMMONIA in the last 168 hours. CBC:  Recent Labs Lab 06/05/14 0450 06/06/14 1146 06/07/14 1111 06/08/14 0805 06/09/14 0555  WBC 10.2 16.3* 15.4* 16.0* 11.5*  HGB 9.0* 7.4* 9.0* 7.4* 7.3*  HCT 28.4* 23.1* 27.4* 22.9* 23.0*  MCV 79.3 80.2 76.1* 77.6* 79.9  PLT 521* 476* 549* 507* 431*   Cardiac Enzymes: No results for input(s): CKTOTAL, CKMB, CKMBINDEX, TROPONINI in the last 168 hours. BNP (last 3 results) No results for input(s): BNP in the last 8760 hours.  ProBNP (last 3 results) No results for input(s): PROBNP in the last 8760 hours.  CBG:  Recent Labs Lab 06/08/14 1829 06/08/14 1850 06/08/14 2102 06/09/14 0757 06/09/14 1128  GLUCAP 54* 73 226* 141* 111*    Recent Results (from the past 240 hour(s))  Blood culture (routine x 2)     Status: None   Collection Time: 06/02/14  6:35 PM  Result Value Ref Range Status   Specimen Description BLOOD LEFT FA  Final   Special Requests BOTTLES DRAWN AEROBIC AND ANAEROBIC 5CC EACH  Final   Culture   Final    NO GROWTH 5 DAYS Performed at [facility]    Report Status 06/09/2014 FINAL  Final  Blood culture (routine x 2)     Status: None   Collection Time: 06/02/14  7:00 PM  Result Value Ref Range Status   Specimen Description BLOOD RIGHT AC  Final   Special Requests BOTTLES DRAWN AEROBIC AND ANAEROBIC 5 CC EACH  Final   Culture   Final    NO GROWTH 5 DAYS Performed at [facility]    Report Status 06/09/2014 FINAL  Final  MRSA PCR Screening     Status: None   Collection Time: 06/03/14 12:12 AM  Result Value Ref Range Status   MRSA by PCR NEGATIVE NEGATIVE Final    Comment:        The GeneXpert MRSA Assay (FDA approved for NASAL  specimens only), is one component of a comprehensive MRSA colonization surveillance program. It is not intended to diagnose MRSA infection nor to guide or monitor treatment for MRSA infections.   Clostridium Difficile by PCR     Status: None   Collection Time: 06/06/14 10:12 AM  Result Value Ref Range Status   C difficile by pcr NEGATIVE NEGATIVE Final     Studies: No results found.  Scheduled Meds: . benzonatate  100 mg Oral TID  . calcitRIOL  0.25 mcg Oral Daily  . chlorhexidine  60 mL Topical Once  . [START ON 06/10/2014] darbepoetin (ARANESP) injection - NON-DIALYSIS  60 mcg Subcutaneous Q Wed-1800  . ferrous sulfate  325 mg Oral Q breakfast  . folic acid  1 mg Oral Daily  . gabapentin  300 mg Oral QHS  . heparin subcutaneous  5,000 Units Subcutaneous 3 times per day  . insulin pump   Subcutaneous TID AC, HS, 0200  . metoCLOPramide (REGLAN) injection  5 mg Intravenous 3 times per day  . [START ON 06/10/2014] pantoprazole  40 mg Oral Q1200  . piperacillin-tazobactam (ZOSYN)  IV  2.25 g Intravenous 3 times per day  . pravastatin  20 mg Oral Daily  . vitamin B-12  100 mcg Oral Daily   Continuous Infusions: .  sodium bicarbonate 150 mEq in sterile water 1000 mL infusion 75 mL/hr at 06/08/14 2355    Time spent: 25 minutes  [name]  [facility] [email or web address], password [facility] 06/09/2014, 1:42 PM  LOS: 7 days

## 2014-06-09 NOTE — Progress Notes (Signed)
ANTIBIOTIC CONSULT NOTE - FOLLOW UP  Pharmacy Consult for Vancomycin + Zosyn Indication: Right diabetic foot infection/cellulitis  Allergies  Allergen Reactions  . Ciprofloxacin Itching  . Cleocin [Clindamycin Hcl]     Diarrhea     Patient Measurements: Height: 5\' 7"  (170.2 cm) Weight: 138 lb 8 oz (62.823 kg) IBW/kg (Calculated) : 61.6  Vital Signs: Temp: 98.6 F (37 C) (03/09 0500) Temp Source: Oral (03/09 0500) BP: 119/58 mmHg (03/09 0500) Pulse Rate: 71 (03/09 0500) Intake/Output from previous day: 03/08 0701 - 03/09 0700 In: 1086.3 [P.O.:480; I.V.:456.3; IV Piggyback:150] Out: 50 [Urine:50] Intake/Output from this shift: Total I/O In: -  Out: 850 [Urine:850]  Labs:  Recent Labs  06/07/14 1111 06/08/14 0805 06/08/14 1942 06/09/14 0555  WBC 15.4* 16.0*  --  11.5*  HGB 9.0* 7.4*  --  7.3*  PLT 549* 507*  --  431*  LABCREA  --   --  71.37  72.05  --   CREATININE 4.05* 5.39*  --  5.85*   Estimated Creatinine Clearance: 12.2 mL/min (by C-G formula based on Cr of 5.85).  Recent Labs  06/07/14 1348 06/09/14 0555  VANCOTROUGH 28.7*  --   VANCORANDOM  --  20.9     Microbiology: Recent Results (from the past 720 hour(s))  Blood culture (routine x 2)     Status: None   Collection Time: 06/02/14  6:35 PM  Result Value Ref Range Status   Specimen Description BLOOD LEFT FA  Final   Special Requests BOTTLES DRAWN AEROBIC AND ANAEROBIC 5CC EACH  Final   Culture   Final    NO GROWTH 5 DAYS Performed at [facility]    Report Status 06/09/2014 FINAL  Final  Blood culture (routine x 2)     Status: None   Collection Time: 06/02/14  7:00 PM  Result Value Ref Range Status   Specimen Description BLOOD RIGHT AC  Final   Special Requests BOTTLES DRAWN AEROBIC AND ANAEROBIC 5 CC EACH  Final   Culture   Final    NO GROWTH 5 DAYS Performed at [facility]    Report Status 06/09/2014 FINAL  Final  MRSA PCR Screening     Status: None   Collection  Time: 06/03/14 12:12 AM  Result Value Ref Range Status   MRSA by PCR NEGATIVE NEGATIVE Final    Comment:        The GeneXpert MRSA Assay (FDA approved for NASAL specimens only), is one component of a comprehensive MRSA colonization surveillance program. It is not intended to diagnose MRSA infection nor to guide or monitor treatment for MRSA infections.   Clostridium Difficile by PCR     Status: None   Collection Time: 06/06/14 10:12 AM  Result Value Ref Range Status   C difficile by pcr NEGATIVE NEGATIVE Final    Anti-infectives    Start     Dose/Rate Route Frequency Ordered Stop   06/09/14 0100  vancomycin (VANCOCIN) 500 mg in sodium chloride 0.9 % 100 mL IVPB  Status:  Discontinued     500 mg 100 mL/hr over 60 Minutes Intravenous Every 36 hours 06/07/14 1533 06/08/14 1151   06/07/14 2200  piperacillin-tazobactam (ZOSYN) IVPB 2.25 g     2.25 g 100 mL/hr over 30 Minutes Intravenous 3 times per day 06/07/14 1411     06/04/14 1400  piperacillin-tazobactam (ZOSYN) IVPB 3.375 g  Status:  Discontinued     3.375 g 12.5 mL/hr over 240 Minutes Intravenous 3  times per day 06/04/14 1250 06/07/14 1410   06/04/14 1400  vancomycin (VANCOCIN) IVPB 750 mg/150 ml premix  Status:  Discontinued     750 mg 150 mL/hr over 60 Minutes Intravenous Every 24 hours 06/04/14 1329 06/07/14 1533   06/03/14 2200  vancomycin (VANCOCIN) 500 mg in sodium chloride 0.9 % 100 mL IVPB  Status:  Discontinued     500 mg 100 mL/hr over 60 Minutes Intravenous Every 24 hours 06/03/14 0005 06/03/14 1116   06/03/14 1200  cefTRIAXone (ROCEPHIN) 1 g in dextrose 5 % 50 mL IVPB - Premix  Status:  Discontinued     1 g 100 mL/hr over 30 Minutes Intravenous Every 24 hours 06/03/14 1116 06/04/14 1250   06/03/14 1200  metroNIDAZOLE (FLAGYL) IVPB 500 mg  Status:  Discontinued     500 mg 100 mL/hr over 60 Minutes Intravenous Every 8 hours 06/03/14 1116 06/04/14 1250   06/03/14 1000  valACYclovir (VALTREX) tablet 500 mg  Status:   Discontinued     500 mg Oral Daily 06/02/14 2333 06/08/14 1516   06/02/14 1830  vancomycin (VANCOCIN) IVPB 1000 mg/200 mL premix     1,000 mg 200 mL/hr over 60 Minutes Intravenous  Once 06/02/14 1827 06/02/14 2150      Assessment: 78 YOF who continues on Vancomycin + Zosyn for a R-diabetic foot ulcer/cellulitis. Imaging has ruled out osteo this admission. Awaiting plans per ortho for I&D. The patient has been noted to have an acute kidney injury. SCr up to 5.39 << 4.05, estimated CrCl<15 ml/min.  A random Vancomycin level this morning remains slightly SUPRAtherapeutic of lower trough range for cellulitis but is trending down (VR 20.9 mcg/ml, goal of 15-20 mcg/ml). Given the patient's previous levels - it is estimated that the patient will take 48-72 hours to trend down to goal range (pending additional changes in renal function). Will continue to hold Vancomycin for now and will recheck a random level on 3/11 AM.  Goal of Therapy:  Vancomycin trough level 10-15 mcg/ml  Proper antibiotics for infection/cultures adjusted for renal/hepatic function   Plan:  1. Continue holding Vancomycin (levels are sufficient for treatment of cellulitis) 2. Continue Zosyn 2.25g IV every 8 hours 3. Will obtain a random Vancomycin level on 3/11 AM to further address additional doses 4. Will continue to follow renal function, culture results, LOT, and antibiotic de-escalation plans   Alycia Rossetti, PharmD, BCPS Clinical Pharmacist Pager: [phone] 06/09/2014 10:22 AM

## 2014-06-09 NOTE — Progress Notes (Signed)
Pt seen and examined Right foot has plantar ulcer which needs debridement Discussed with patient - she is amenable to surgery tomorrow with dr Sharol Given

## 2014-06-09 NOTE — Progress Notes (Signed)
Patient ID: [name], female   DOB: [birth year], 44 y.o.   MRN: [id number]  [facility] Progress Note    Assessment/ Plan:   1. Acute renal failure on chronic kidney disease stage III: This is suspected to be multifactorial from possibly nephrotoxic ATN (low likelihood of vancomycin), SIRS associated ATN or postinfectious GN. This is all in the background of diabetic kidney disease. Urine sediment with granular casts suggestive of ATN-fair urine output but no significant improvement of renal function. We'll continue to follow-acute dialysis needs at this time. 2. Right lower extremity cellulitis: Awaiting further evaluation/management by Dr. Sharol Given following results of MRI. 3. Hyponatremia: Pseudohyponatremia secondary to hyperglycemia and true hyponatremia secondary to acute renal failure and defective free water excretion-follow with intravenous fluids 4. Anemia: Appears to be from underlying chronic kidney disease and currently with erythropoietin resistance secondary to infection, with significant iron deficiency-we'll replace this after she undergoes incision and drainage of fluid collections in her foot. Start ESA.  Subjective:   Reports to be feeling fair-anxious about declining renal function    Objective:   BP 134/65 mmHg  Pulse 77  Temp(Src) 98.8 F (37.1 C) (Oral)  Resp 18  Ht 5\' 7"  (1.702 m)  Wt 62.823 kg (138 lb 8 oz)  BMI 21.69 kg/m2  SpO2 100%  LMP 05/21/2014  Intake/Output Summary (Last 24 hours) at 06/09/14 1130 Last data filed at 06/09/14 0900  Gross per 24 hour  Intake 1326.25 ml  Output   1100 ml  Net 226.25 ml   Weight change: 1.323 kg (2 lb 14.7 oz)  Physical Exam: Gen: Comfortably sitting up in bed, watching television CVS: Pulse regular rate and rhythm, S1 and S2 normal Resp: Clear to auscultation, no rales Abd: Soft, flat, nontender Ext: Left foot chronic plantar ulcer, right foot with blister in the plantar surface mid  foot/erythema/cellulitis  Imaging: Dg Chest 2 View  06/07/2014   CLINICAL DATA:  Cough, diabetes, hypertension, hypercholesterolemia, former smoker  EXAM: CHEST  2 VIEW  COMPARISON:  06/25/2013  FINDINGS: Upper normal heart size.  Mediastinal contours and pulmonary vascularity normal.  Minimal chronic peribronchial thickening without infiltrate, pleural effusion or pneumothorax.  Bones demineralized.  IMPRESSION: Minimal chronic bronchitic changes without acute infiltrate.   Electronically Signed   By: Lavonia Dana M.D.   On: 06/07/2014 11:44    Labs: BMET  Recent Labs Lab 06/03/14 0308 06/04/14 0520 06/05/14 0450 06/06/14 1146 06/07/14 1111 06/08/14 0805 06/09/14 0555  NA 136 133* 132* 131* 128* 132* 132*  K 4.4 5.0 4.9 5.1 4.9 4.8 4.8  CL 111 111 103 102 101 103 101  CO2 16* 13* 15* 18* 17* 18* 19  GLUCOSE 58* 189* 186* 240* 254* 184* 168*  BUN 32* 32* 35* 40* 45* 52* 56*  CREATININE 2.07* 1.96* 2.08* 3.09* 4.05* 5.39* 5.85*  CALCIUM 8.2* 7.9* 8.3* 7.9* 8.1* 8.1* 8.0*   CBC  Recent Labs Lab 06/06/14 1146 06/07/14 1111 06/08/14 0805 06/09/14 0555  WBC 16.3* 15.4* 16.0* 11.5*  HGB 7.4* 9.0* 7.4* 7.3*  HCT 23.1* 27.4* 22.9* 23.0*  MCV 80.2 76.1* 77.6* 79.9  PLT 476* 549* 507* 431*    Medications:    . benzonatate  100 mg Oral TID  . calcitRIOL  0.25 mcg Oral Daily  . chlorhexidine  60 mL Topical Once  . ferrous sulfate  325 mg Oral Q breakfast  . folic acid  1 mg Oral Daily  . gabapentin  300 mg Oral QHS  .  heparin subcutaneous  5,000 Units Subcutaneous 3 times per day  . insulin pump   Subcutaneous TID AC, HS, 0200  . metoCLOPramide (REGLAN) injection  5 mg Intravenous 3 times per day  . [START ON 06/10/2014] pantoprazole  40 mg Oral Q1200  . piperacillin-tazobactam (ZOSYN)  IV  2.25 g Intravenous 3 times per day  . pravastatin  20 mg Oral Daily  . vitamin B-12  100 mcg Oral Daily   Elmarie Shiley, MD 06/09/2014, 11:30 AM

## 2014-06-10 ENCOUNTER — Inpatient Hospital Stay (HOSPITAL_COMMUNITY): Payer: Medicaid Other | Admitting: Anesthesiology

## 2014-06-10 ENCOUNTER — Encounter (HOSPITAL_COMMUNITY)
Admission: EM | Disposition: A | Discharge status: Home Health | Payer: Self-pay | Source: Home / Self Care | Attending: Internal Medicine

## 2014-06-10 HISTORY — PX: I & D EXTREMITY: SHX5045

## 2014-06-10 LAB — BASIC METABOLIC PANEL
ANION GAP: 12 (ref 5–15)
BUN: 45 mg/dL — ABNORMAL HIGH (ref 6–23)
CO2: 24 mmol/L (ref 19–32)
Calcium: 7.6 mg/dL — ABNORMAL LOW (ref 8.4–10.5)
Chloride: 101 mmol/L (ref 96–112)
Creatinine, Ser: 4.94 mg/dL — ABNORMAL HIGH (ref 0.50–1.10)
GFR calc Af Amer: 12 mL/min — ABNORMAL LOW (ref >90)
GFR calc non Af Amer: 10 mL/min — ABNORMAL LOW (ref >90)
GLUCOSE: 146 mg/dL — AB (ref 70–99)
Potassium: 4 mmol/L (ref 3.5–5.1)
Sodium: 137 mmol/L (ref 135–145)

## 2014-06-10 LAB — GLUCOSE, CAPILLARY
GLUCOSE-CAPILLARY: 94 mg/dL (ref 70–99)
Glucose-Capillary: 189 mg/dL — ABNORMAL HIGH (ref 70–99)
Glucose-Capillary: 153 mg/dL — ABNORMAL HIGH (ref 70–99)
Glucose-Capillary: 127 mg/dL — ABNORMAL HIGH (ref 70–99)
Glucose-Capillary: 90 mg/dL (ref 70–99)
Glucose-Capillary: 96 mg/dL (ref 70–99)

## 2014-06-10 LAB — CBC
HCT: 22.3 % — ABNORMAL LOW (ref 36.0–46.0)
Hemoglobin: 7.1 g/dL — ABNORMAL LOW (ref 12.0–15.0)
MCH: 24.7 pg — AB (ref 26.0–34.0)
MCHC: 31.8 g/dL (ref 30.0–36.0)
MCV: 77.7 fL — AB (ref 78.0–100.0)
Platelets: 531 10*3/uL — ABNORMAL HIGH (ref 150–400)
RBC: 2.87 MIL/uL — ABNORMAL LOW (ref 3.87–5.11)
RDW: 16.3 % — ABNORMAL HIGH (ref 11.5–15.5)
WBC: 11.8 10*3/uL — ABNORMAL HIGH (ref 4.0–10.5)

## 2014-06-10 LAB — C4 COMPLEMENT: COMPLEMENT C4, BODY FLUID: 20 mg/dL (ref 14–44)

## 2014-06-10 LAB — CLOSTRIDIUM DIFFICILE BY PCR: Toxigenic C. Difficile by PCR: NEGATIVE

## 2014-06-10 LAB — C3 COMPLEMENT: C3 Complement: 117 mg/dL (ref 82–167)

## 2014-06-10 LAB — ANTISTREPTOLYSIN O TITER
ASO: 203.7 IU/mL — ABNORMAL HIGH (ref 0.0–200.0)
ASO: 192.2 IU/mL (ref 0.0–200.0)

## 2014-06-10 SURGERY — IRRIGATION AND DEBRIDEMENT EXTREMITY
Anesthesia: General | Site: Foot | Laterality: Right

## 2014-06-10 MED ORDER — MIDAZOLAM HCL 2 MG/2ML IJ SOLN
INTRAMUSCULAR | Status: DC | PRN
  Administered 2014-06-10: 2 mg via INTRAVENOUS

## 2014-06-10 MED ORDER — MEPERIDINE HCL 25 MG/ML IJ SOLN: 6.2500 mg | INTRAMUSCULAR | Status: DC | PRN

## 2014-06-10 MED ORDER — HYDROMORPHONE HCL 1 MG/ML IJ SOLN
0.5000 mg | INTRAMUSCULAR | Status: DC | PRN
  Administered 2014-06-10: 0.5 mg via INTRAVENOUS
  Administered 2014-06-11 – 2014-06-14 (×4): 1 mg via INTRAVENOUS
  Filled 2014-06-10 (×5): qty 1

## 2014-06-10 MED ORDER — PROMETHAZINE HCL 25 MG/ML IJ SOLN: 6.2500 mg | INTRAMUSCULAR | Status: DC | PRN

## 2014-06-10 MED ORDER — ONDANSETRON HCL 4 MG/2ML IJ SOLN
INTRAMUSCULAR | Status: DC | PRN
  Administered 2014-06-10: 4 mg via INTRAVENOUS

## 2014-06-10 MED ORDER — LIDOCAINE HCL (CARDIAC) 20 MG/ML IV SOLN
INTRAVENOUS | Status: DC | PRN
  Administered 2014-06-10: 100 mg via INTRAVENOUS

## 2014-06-10 MED ORDER — KETOROLAC TROMETHAMINE 30 MG/ML IJ SOLN
INTRAMUSCULAR | Status: AC
  Filled 2014-06-10: qty 1

## 2014-06-10 MED ORDER — FENTANYL CITRATE 0.05 MG/ML IJ SOLN
INTRAMUSCULAR | Status: AC
  Filled 2014-06-10: qty 5

## 2014-06-10 MED ORDER — METHOCARBAMOL 1000 MG/10ML IJ SOLN
500.0000 mg | Freq: Four times a day (QID) | INTRAVENOUS | Status: DC | PRN
  Filled 2014-06-10: qty 5

## 2014-06-10 MED ORDER — METOCLOPRAMIDE HCL 5 MG PO TABS: 5.0000 mg | ORAL_TABLET | Freq: Three times a day (TID) | ORAL | Status: DC | PRN

## 2014-06-10 MED ORDER — FENTANYL CITRATE 0.05 MG/ML IJ SOLN
INTRAMUSCULAR | Status: DC | PRN
  Administered 2014-06-10 (×3): 50 ug via INTRAVENOUS

## 2014-06-10 MED ORDER — LIDOCAINE HCL (CARDIAC) 20 MG/ML IV SOLN
INTRAVENOUS | Status: AC
  Filled 2014-06-10: qty 10

## 2014-06-10 MED ORDER — OXYCODONE-ACETAMINOPHEN 5-325 MG PO TABS: 1.0000 | ORAL_TABLET | ORAL | Status: DC | PRN

## 2014-06-10 MED ORDER — METHOCARBAMOL 500 MG PO TABS: 500.0000 mg | ORAL_TABLET | Freq: Four times a day (QID) | ORAL | Status: DC | PRN

## 2014-06-10 MED ORDER — SODIUM CHLORIDE 0.9 % IV SOLN: INTRAVENOUS | Status: DC

## 2014-06-10 MED ORDER — FENTANYL CITRATE 0.05 MG/ML IJ SOLN
25.0000 ug | INTRAMUSCULAR | Status: DC | PRN
  Administered 2014-06-10: 50 ug via INTRAVENOUS

## 2014-06-10 MED ORDER — ONDANSETRON HCL 4 MG PO TABS: 4.0000 mg | ORAL_TABLET | Freq: Four times a day (QID) | ORAL | Status: DC | PRN

## 2014-06-10 MED ORDER — LACTATED RINGERS IV SOLN
INTRAVENOUS | Status: DC | PRN
  Administered 2014-06-10: 20:00:00 via INTRAVENOUS

## 2014-06-10 MED ORDER — LORATADINE 10 MG PO TABS
10.0000 mg | ORAL_TABLET | Freq: Every day | ORAL | Status: DC
  Administered 2014-06-10 – 2014-06-18 (×8): 10 mg via ORAL
  Filled 2014-06-10 (×9): qty 1

## 2014-06-10 MED ORDER — MIDAZOLAM HCL 2 MG/2ML IJ SOLN: 0.5000 mg | Freq: Once | INTRAMUSCULAR | Status: DC | PRN

## 2014-06-10 MED ORDER — ONDANSETRON HCL 4 MG/2ML IJ SOLN
INTRAMUSCULAR | Status: AC
  Filled 2014-06-10: qty 2

## 2014-06-10 MED ORDER — MIDAZOLAM HCL 2 MG/2ML IJ SOLN
INTRAMUSCULAR | Status: AC
  Filled 2014-06-10: qty 2

## 2014-06-10 MED ORDER — ONDANSETRON HCL 4 MG/2ML IJ SOLN: 4.0000 mg | Freq: Four times a day (QID) | INTRAMUSCULAR | Status: DC | PRN

## 2014-06-10 MED ORDER — PROPOFOL 10 MG/ML IV BOLUS
INTRAVENOUS | Status: DC | PRN
  Administered 2014-06-10: 150 mg via INTRAVENOUS

## 2014-06-10 MED ORDER — DOCUSATE SODIUM 100 MG PO CAPS
100.0000 mg | ORAL_CAPSULE | Freq: Two times a day (BID) | ORAL | Status: DC
Start: 2014-06-10 — End: 2014-06-15
  Administered 2014-06-10 – 2014-06-14 (×8): 100 mg via ORAL
  Filled 2014-06-10 (×13): qty 1

## 2014-06-10 MED ORDER — SODIUM CHLORIDE 0.9 % IV SOLN
INTRAVENOUS | Status: DC
  Administered 2014-06-10: 22:00:00 via INTRAVENOUS

## 2014-06-10 MED ORDER — FENTANYL CITRATE 0.05 MG/ML IJ SOLN
INTRAMUSCULAR | Status: AC
  Filled 2014-06-10: qty 2

## 2014-06-10 MED ORDER — METOCLOPRAMIDE HCL 5 MG/ML IJ SOLN: 5.0000 mg | Freq: Three times a day (TID) | INTRAMUSCULAR | Status: DC | PRN

## 2014-06-10 MED ORDER — PROPOFOL 10 MG/ML IV BOLUS
INTRAVENOUS | Status: AC
  Filled 2014-06-10: qty 20

## 2014-06-10 SURGICAL SUPPLY — 40 items
BLADE SURG 10 STRL SS (BLADE) IMPLANT
BNDG COHESIVE 4X5 TAN STRL (GAUZE/BANDAGES/DRESSINGS) IMPLANT
BNDG COHESIVE 6X5 TAN STRL LF (GAUZE/BANDAGES/DRESSINGS) ×1 IMPLANT
BNDG GAUZE ELAST 4 BULKY (GAUZE/BANDAGES/DRESSINGS) ×1 IMPLANT
COVER SURGICAL LIGHT HANDLE (MISCELLANEOUS) ×2 IMPLANT
CUFF TOURNIQUET SINGLE 18IN (TOURNIQUET CUFF) IMPLANT
CUFF TOURNIQUET SINGLE 24IN (TOURNIQUET CUFF) IMPLANT
CUFF TOURNIQUET SINGLE 34IN LL (TOURNIQUET CUFF) IMPLANT
CUFF TOURNIQUET SINGLE 44IN (TOURNIQUET CUFF) IMPLANT
DRAPE U-SHAPE 47X51 STRL (DRAPES) ×2 IMPLANT
DRSG ADAPTIC 3X8 NADH LF (GAUZE/BANDAGES/DRESSINGS) ×2 IMPLANT
DRSG PAD ABDOMINAL 8X10 ST (GAUZE/BANDAGES/DRESSINGS) ×1 IMPLANT
DURAPREP 26ML APPLICATOR (WOUND CARE) ×2 IMPLANT
ELECT CAUTERY BLADE 6.4 (BLADE) IMPLANT
ELECT REM PT RETURN 9FT ADLT (ELECTROSURGICAL)
ELECTRODE REM PT RTRN 9FT ADLT (ELECTROSURGICAL) IMPLANT
GAUZE SPONGE 4X4 12PLY STRL (GAUZE/BANDAGES/DRESSINGS) ×2 IMPLANT
GLOVE BIOGEL PI IND STRL 9 (GLOVE) ×1 IMPLANT
GLOVE BIOGEL PI INDICATOR 9 (GLOVE) ×1
GLOVE SURG ORTHO 9.0 STRL STRW (GLOVE) ×2 IMPLANT
GOWN STRL REUS W/ TWL XL LVL3 (GOWN DISPOSABLE) ×2 IMPLANT
GOWN STRL REUS W/TWL XL LVL3 (GOWN DISPOSABLE) ×4
HANDPIECE INTERPULSE COAX TIP (DISPOSABLE)
KIT BASIN OR (CUSTOM PROCEDURE TRAY) ×2 IMPLANT
KIT ROOM TURNOVER OR (KITS) ×2 IMPLANT
MANIFOLD NEPTUNE II (INSTRUMENTS) ×2 IMPLANT
NS IRRIG 1000ML POUR BTL (IV SOLUTION) ×2 IMPLANT
PACK ORTHO EXTREMITY (CUSTOM PROCEDURE TRAY) ×2 IMPLANT
PAD ARMBOARD 7.5X6 YLW CONV (MISCELLANEOUS) ×4 IMPLANT
PADDING CAST COTTON 6X4 STRL (CAST SUPPLIES) ×2 IMPLANT
SET HNDPC FAN SPRY TIP SCT (DISPOSABLE) IMPLANT
SPONGE LAP 18X18 X RAY DECT (DISPOSABLE) ×2 IMPLANT
STOCKINETTE IMPERVIOUS 9X36 MD (GAUZE/BANDAGES/DRESSINGS) IMPLANT
TOWEL OR 17X24 6PK STRL BLUE (TOWEL DISPOSABLE) ×2 IMPLANT
TOWEL OR 17X26 10 PK STRL BLUE (TOWEL DISPOSABLE) ×2 IMPLANT
TUBE ANAEROBIC SPECIMEN COL (MISCELLANEOUS) IMPLANT
TUBE CONNECTING 12X1/4 (SUCTIONS) ×2 IMPLANT
UNDERPAD 30X30 INCONTINENT (UNDERPADS AND DIAPERS) ×2 IMPLANT
WATER STERILE IRR 1000ML POUR (IV SOLUTION) ×2 IMPLANT
YANKAUER SUCT BULB TIP NO VENT (SUCTIONS) ×2 IMPLANT

## 2014-06-10 NOTE — Interval H&P Note (Signed)
History and Physical Interval Note:  06/10/2014 5:36 PM  [name]  has presented today for surgery, with the diagnosis of Right Foot Abscess   The various methods of treatment have been discussed with the patient and family. After consideration of risks, benefits and other options for treatment, the patient has consented to  Procedure(s): IRRIGATION AND DEBRIDEMENT Right Foot (Right) as a surgical intervention .  The patient's history has been reviewed, patient examined, no change in status, stable for surgery.  I have reviewed the patient's chart and labs.  Questions were answered to the patient's satisfaction.     DUDA,MARCUS V

## 2014-06-10 NOTE — Transfer of Care (Signed)
Immediate Anesthesia Transfer of Care Note  Patient: [name]  Procedure(s) Performed: Procedure(s): IRRIGATION AND DEBRIDEMENT Right Foot (Right)  Patient Location: PACU  Anesthesia Type:General  Level of Consciousness: awake, alert  and oriented  Airway & Oxygen Therapy: Patient connected to nasal cannula oxygen  Post-op Assessment: Report given to RN, Post -op Vital signs reviewed and stable and Patient moving all extremities X 4  Post vital signs: Reviewed and stable  Last Vitals:  Filed Vitals:   06/10/14 1000  BP: 147/76  Pulse: 82  Temp: 36.7 C  Resp: 18    Complications: No apparent anesthesia complications

## 2014-06-10 NOTE — Anesthesia Postprocedure Evaluation (Signed)
  Anesthesia Post-op Note  Patient: [name]  Procedure(s) Performed: Procedure(s) (LRB): IRRIGATION AND DEBRIDEMENT Right Foot (Right)  Patient Location: PACU  Anesthesia Type: General  Level of Consciousness: awake and alert   Airway and Oxygen Therapy: Patient Spontanous Breathing  Post-op Pain: mild  Post-op Assessment: Post-op Vital signs reviewed, Patient's Cardiovascular Status Stable, Respiratory Function Stable, Patent Airway and No signs of Nausea or vomiting  Last Vitals:  Filed Vitals:   06/10/14 2015  BP: 120/78  Pulse: 86  Temp:   Resp: 14    Post-op Vital Signs: stable   Complications: No apparent anesthesia complications

## 2014-06-10 NOTE — Progress Notes (Signed)
[facility] PROGRESS NOTE  [name] [id number] DOB: 03/18/1971 DOA: 06/02/2014 PCP: No primary care provider on file.  Assessment/Plan:    Cellulitis of right foot: MRI without osteomyelitis or deep abscess. ABIs done-- needs irrigation and debridement of the right foot abscess - patient refused Dr. Sharol Given- request Doran Durand but he is out of town,  Asked Dr. Marlou Sa to help- appreciate his and Dr. 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- consult renal   DM type 1 causing renal disease  prob diabetic gastroparesis- IV reglan Chronic anemia: resume iron, given dose of aranesp -routine check of HIV antibody negative  HPI/Subjective: C/o nasal drainage   Objective: Filed Vitals:   06/10/14 1000  BP: 147/76  Pulse: 82  Temp: 98 F (36.7 C)  Resp: 18    Intake/Output Summary (Last 24 hours) at 06/10/14 1244 Last data filed at 06/10/14 0900  Gross per 24 hour  Intake   2920 ml  Output   2900 ml  Net     20 ml   Filed Weights   06/06/14 2114 06/07/14 2100 06/08/14 2100  Weight: 61.236 kg (135 lb) 61.5 kg (135 lb 9.3 oz) 62.823 kg (138 lb 8 oz)    Exam:   General:  Poor insight into disease process  Cardiovascular: rrr without mgr  Respiratory: cta without wrr  Abdomen: s, nt, nd  Ext:  Right plantar surface with blister midfoot, extending, erythema, and boggy indurationlessened  No odor, sero-sangenous drainage  Basic Metabolic Panel:  Recent Labs Lab 06/06/14 1146 06/07/14 1111 06/08/14 0805 06/09/14 0555 06/10/14 1050  NA 131* 128* 132* 132* 137  K 5.1 4.9 4.8 4.8 4.0  CL 102 101 103 101 101  CO2 18* 17* 18* 19 24  GLUCOSE 240* 254* 184* 168* 146*  BUN 40* 45* 52* 56* 45*  CREATININE 3.09* 4.05* 5.39* 5.85* 4.94*  CALCIUM 7.9* 8.1* 8.1* 8.0* 7.6*   Liver Function  Tests: No results for input(s): AST, ALT, ALKPHOS, BILITOT, PROT, ALBUMIN in the last 168 hours. No results for input(s): LIPASE, AMYLASE in the last 168 hours. No results for input(s): AMMONIA in the last 168 hours. CBC:  Recent Labs Lab 06/06/14 1146 06/07/14 1111 06/08/14 0805 06/09/14 0555 06/10/14 0114  WBC 16.3* 15.4* 16.0* 11.5* 11.8*  HGB 7.4* 9.0* 7.4* 7.3* 7.1*  HCT 23.1* 27.4* 22.9* 23.0* 22.3*  MCV 80.2 76.1* 77.6* 79.9 77.7*  PLT 476* 549* 507* 431* 531*   Cardiac Enzymes: No results for input(s): CKTOTAL, CKMB, CKMBINDEX, TROPONINI in the last 168 hours. BNP (last 3 results) No results for input(s): BNP in the last 8760 hours.  ProBNP (last 3 results) No results for input(s): PROBNP in the last 8760 hours.  CBG:  Recent Labs Lab 06/09/14 1623 06/09/14 2158 06/10/14 0250 06/10/14 0756 06/10/14 1133  GLUCAP 135* 170* 189* 153* 127*    Recent Results (from the past 240 hour(s))  Blood culture (routine x 2)     Status: None   Collection Time: 06/02/14  6:35 PM  Result Value Ref Range Status   Specimen Description BLOOD LEFT FA  Final   Special Requests BOTTLES DRAWN AEROBIC AND ANAEROBIC 5CC EACH  Final   Culture   Final    NO GROWTH 5 DAYS Performed at [facility]    Report Status 06/09/2014 FINAL  Final  Blood culture (routine x 2)     Status: None   Collection Time: 06/02/14  7:00 PM  Result Value Ref Range Status   Specimen Description BLOOD RIGHT AC  Final   Special Requests BOTTLES DRAWN AEROBIC AND ANAEROBIC 5 CC EACH  Final   Culture   Final    NO GROWTH 5 DAYS Performed at [facility]    Report Status 06/09/2014 FINAL  Final  MRSA PCR Screening     Status: None   Collection Time: 06/03/14 12:12 AM  Result Value Ref Range Status   MRSA by PCR NEGATIVE NEGATIVE Final    Comment:        The GeneXpert MRSA Assay (FDA approved for NASAL specimens only), is one component of a comprehensive MRSA  colonization surveillance program. It is not intended to diagnose MRSA infection nor to guide or monitor treatment for MRSA infections.   Clostridium Difficile by PCR     Status: None   Collection Time: 06/06/14 10:12 AM  Result Value Ref Range Status   C difficile by pcr NEGATIVE NEGATIVE Final  Clostridium Difficile by PCR     Status: None   Collection Time: 06/10/14 12:10 AM  Result Value Ref Range Status   C difficile by pcr NEGATIVE NEGATIVE Final     Studies: No results found.  Scheduled Meds: . benzonatate  100 mg Oral TID  . calcitRIOL  0.25 mcg Oral Daily  . chlorhexidine  60 mL Topical Once  . darbepoetin (ARANESP) injection - NON-DIALYSIS  60 mcg Subcutaneous Q Thu-1800  . ferrous sulfate  325 mg Oral Q breakfast  . folic acid  1 mg Oral Daily  . gabapentin  300 mg Oral QHS  . heparin subcutaneous  5,000 Units Subcutaneous 3 times per day  . insulin pump   Subcutaneous TID AC, HS, 0200  . loratadine  10 mg Oral Daily  . metoCLOPramide (REGLAN) injection  5 mg Intravenous 3 times per day  . pantoprazole  40 mg Oral Q1200  . piperacillin-tazobactam (ZOSYN)  IV  2.25 g Intravenous 3 times per day  . pravastatin  20 mg Oral Daily  . vitamin B-12  100 mcg Oral Daily   Continuous Infusions: .  sodium bicarbonate 150 mEq in sterile water 1000 mL infusion 75 mL/hr at 06/10/14 [id number]    Time spent: 25 minutes  [name]  [facility] [email or web address], password [email or web address] 06/10/2014, 12:44 PM  LOS: 8 days

## 2014-06-10 NOTE — Progress Notes (Signed)
Patient showed watery stool in the hat. Stool red in color. Patient stated she has not been eating good and was eating a lot of red jello and have eaten cherry flavored candies. Will update MD on call.

## 2014-06-10 NOTE — Progress Notes (Signed)
Patient ID: ARPITA CRISCI, female   DOB: 1970-04-16, 44 y.o.   MRN: [id number]  [facility] Progress Note    Assessment/ Plan:   1. Acute renal failure on chronic kidney disease stage III: This is suspected to be multifactorial from possibly nephrotoxic ATN (low likelihood of vancomycin), SIRS associated ATN or postinfectious GN. This is all in the background of diabetic kidney disease. Urine sediment with granular casts suggestive of ATN. She has an excellent urine output-labs from this morning is still pending. 2. Right lower extremity cellulitis/possible abscess-awaiting incision and drainage by Dr. Sharol Given 3. Hyponatremia: Pseudohyponatremia secondary to hyperglycemia and true hyponatremia secondary to acute renal failure and defective free water excretion- labs pending from today 4. Anemia: Appears to be from underlying chronic kidney disease and currently with erythropoietin resistance secondary to infection, with significant iron deficiency-we'll replace this after she undergoes incision and drainage of fluid collections in her foot. Start ESA.  Subjective:   Some loose stools overnight-possibly ingested foods to discolor it red   Objective:   BP 133/71 mmHg  Pulse 79  Temp(Src) 98.9 F (37.2 C) (Oral)  Resp 16  Ht 5\' 7"  (1.702 m)  Wt 62.823 kg (138 lb 8 oz)  BMI 21.69 kg/m2  SpO2 99%  LMP 05/21/2014  Intake/Output Summary (Last 24 hours) at 06/10/14 1021 Last data filed at 06/10/14 0600  Gross per 24 hour  Intake   2920 ml  Output   2900 ml  Net     20 ml   Weight change:   Physical Exam: Gen: Comfortably sleeping in bed [id number] RRR Resp:CTA bilaterally, no rales [id number], flat, NT Ext:No LE edema, chronic left LE wound and cellulitis/fluid collection right foot  Imaging: No results found.  Labs: BMET  Recent Labs Lab 06/04/14 0520 06/05/14 0450 06/06/14 1146 06/07/14 1111 06/08/14 0805 06/09/14 0555  NA 133* 132* 131* 128* 132* 132*   K 5.0 4.9 5.1 4.9 4.8 4.8  CL 111 103 102 101 103 101  CO2 13* 15* 18* 17* 18* 19  GLUCOSE 189* 186* 240* 254* 184* 168*  BUN 32* 35* 40* 45* 52* 56*  CREATININE 1.96* 2.08* 3.09* 4.05* 5.39* 5.85*  CALCIUM 7.9* 8.3* 7.9* 8.1* 8.1* 8.0*   CBC  Recent Labs Lab 06/07/14 1111 06/08/14 0805 06/09/14 0555 06/10/14 0114  WBC 15.4* 16.0* 11.5* 11.8*  HGB 9.0* 7.4* 7.3* 7.1*  HCT 27.4* 22.9* 23.0* 22.3*  MCV 76.1* 77.6* 79.9 77.7*  PLT 549* 507* 431* 531*    Medications:    . benzonatate  100 mg Oral TID  . calcitRIOL  0.25 mcg Oral Daily  . chlorhexidine  60 mL Topical Once  . darbepoetin (ARANESP) injection - NON-DIALYSIS  60 mcg Subcutaneous Q Thu-1800  . ferrous sulfate  325 mg Oral Q breakfast  . folic acid  1 mg Oral Daily  . gabapentin  300 mg Oral QHS  . heparin subcutaneous  5,000 Units Subcutaneous 3 times per day  . insulin pump   Subcutaneous TID AC, HS, 0200  . metoCLOPramide (REGLAN) injection  5 mg Intravenous 3 times per day  . pantoprazole  40 mg Oral Q1200  . piperacillin-tazobactam (ZOSYN)  IV  2.25 g Intravenous 3 times per day  . pravastatin  20 mg Oral Daily  . vitamin B-12  100 mcg Oral Daily      Elmarie Shiley, MD 06/10/2014, 10:21 AM

## 2014-06-10 NOTE — Progress Notes (Signed)
Examination of the patient's right foot shows increasing necrotic tissue on the plantar aspect of her right foot area of approximately 2 x 3 cm. The necrotic tissue is much worse than it was 3 days ago when I tried to perform irrigation and debridement. Patient requested a second opinion, however, 3 orthopedic surgeons since my initial evaluation have deferred treatment. Patient feels like the progressive ulceration on her foot is due to her hospitalization. Discussed that we most likely will need several surgeries for debridement and foot salvage. Patient has progressive worsening of her renal function with end-stage renal disease and has decreasing hemoglobin secondary to the infection. Persistently elevated white blood cell count.

## 2014-06-10 NOTE — H&P (View-Only) (Signed)
Reason for Consult: Abscess right foot Referring Physician: Dr. [name] is an 44 y.o. female.  HPI: Patient is a 44 year old woman diabetic neuropathy with ulcer on the left foot and 3 week history of cellulitis and pain on the plantar aspect of the right foot. Patient is currently seen at the [facility] wound center  Past Medical History  Diagnosis Date  . Diabetes mellitus   . Neuropathy in diabetes   . Hypertension   . Hypercholesteremia   . H/O seasonal allergies   . Anemia   . Left foot infection     Past Surgical History  Procedure Laterality Date  . Cesarean section    . Eye surgery    . Hemrrhoidectomy    . Peripherally inserted central catheter insertion      History reviewed. No pertinent family history.  Social History:  reports that she quit smoking about 6 years ago. She has never used smokeless tobacco. She reports that she drinks about 0.5 oz of alcohol per week. She reports that she does not use illicit drugs.  Allergies:  Allergies  Allergen Reactions  . Ciprofloxacin Itching  . Cleocin [Clindamycin Hcl]     Diarrhea     Medications: I have reviewed the patient's current medications.  Results for orders placed or performed during the hospital encounter of 06/02/14 (from the past 48 hour(s))  Glucose, capillary     Status: Abnormal   Collection Time: 06/06/14  7:58 AM  Result Value Ref Range   Glucose-Capillary 178 (H) 70 - 99 mg/dL  Clostridium Difficile by PCR     Status: None   Collection Time: 06/06/14 10:12 AM  Result Value Ref Range   C difficile by pcr NEGATIVE NEGATIVE  Glucose, capillary     Status: Abnormal   Collection Time: 06/06/14 11:34 AM  Result Value Ref Range   Glucose-Capillary 233 (H) 70 - 99 mg/dL  CBC     Status: Abnormal   Collection Time: 06/06/14 11:46 AM  Result Value Ref Range   WBC 16.3 (H) 4.0 - 10.5 K/uL   RBC 2.88 (L) 3.87 - 5.11 MIL/uL   Hemoglobin 7.4 (L) 12.0 - 15.0 g/dL    Comment: REPEATED  TO VERIFY DELTA CHECK NOTED    HCT 23.1 (L) 36.0 - 46.0 %   MCV 80.2 78.0 - 100.0 fL   MCH 25.7 (L) 26.0 - 34.0 pg   MCHC 32.0 30.0 - 36.0 g/dL   RDW 16.5 (H) 11.5 - 15.5 %   Platelets 476 (H) 150 - 400 K/uL  Basic metabolic panel     Status: Abnormal   Collection Time: 06/06/14 11:46 AM  Result Value Ref Range   Sodium 131 (L) 135 - 145 mmol/L   Potassium 5.1 3.5 - 5.1 mmol/L   Chloride 102 96 - 112 mmol/L   CO2 18 (L) 19 - 32 mmol/L   Glucose, Bld 240 (H) 70 - 99 mg/dL   BUN 40 (H) 6 - 23 mg/dL   Creatinine, Ser 3.09 (H) 0.50 - 1.10 mg/dL   Calcium 7.9 (L) 8.4 - 10.5 mg/dL   GFR calc non Af Amer 18 (L) >90 mL/min   GFR calc Af Amer 20 (L) >90 mL/min    Comment: (NOTE) The eGFR has been calculated using the CKD EPI equation. This calculation has not been validated in all clinical situations. eGFR's persistently <90 mL/min signify possible Chronic Kidney Disease.    Anion gap 11 5 - 15  HIV antibody     Status: None   Collection Time: 06/06/14 11:46 AM  Result Value Ref Range   HIV Screen 4th Generation wRfx Non Reactive Non Reactive    Comment: (NOTE) Performed At: [facility] [place], [place] [id number] Lindon Romp MD [id number]   Glucose, capillary     Status: Abnormal   Collection Time: 06/06/14  4:33 PM  Result Value Ref Range   Glucose-Capillary 275 (H) 70 - 99 mg/dL  Glucose, capillary     Status: None   Collection Time: 06/06/14  9:06 PM  Result Value Ref Range   Glucose-Capillary 74 70 - 99 mg/dL  Glucose, capillary     Status: Abnormal   Collection Time: 06/07/14  1:38 AM  Result Value Ref Range   Glucose-Capillary 260 (H) 70 - 99 mg/dL  Glucose, capillary     Status: Abnormal   Collection Time: 06/07/14  7:47 AM  Result Value Ref Range   Glucose-Capillary 268 (H) 70 - 99 mg/dL  CBC     Status: Abnormal   Collection Time: 06/07/14 11:11 AM  Result Value Ref Range   WBC 15.4 (H) 4.0 - 10.5 K/uL   RBC 3.60 (L) 3.87 -  5.11 MIL/uL   Hemoglobin 9.0 (L) 12.0 - 15.0 g/dL    Comment: REPEATED TO VERIFY   HCT 27.4 (L) 36.0 - 46.0 %   MCV 76.1 (L) 78.0 - 100.0 fL   MCH 25.0 (L) 26.0 - 34.0 pg   MCHC 32.8 30.0 - 36.0 g/dL   RDW 16.4 (H) 11.5 - 15.5 %   Platelets 549 (H) 150 - 400 K/uL  Basic metabolic panel     Status: Abnormal   Collection Time: 06/07/14 11:11 AM  Result Value Ref Range   Sodium 128 (L) 135 - 145 mmol/L   Potassium 4.9 3.5 - 5.1 mmol/L   Chloride 101 96 - 112 mmol/L   CO2 17 (L) 19 - 32 mmol/L   Glucose, Bld 254 (H) 70 - 99 mg/dL   BUN 45 (H) 6 - 23 mg/dL   Creatinine, Ser 4.05 (H) 0.50 - 1.10 mg/dL   Calcium 8.1 (L) 8.4 - 10.5 mg/dL   GFR calc non Af Amer 13 (L) >90 mL/min   GFR calc Af Amer 15 (L) >90 mL/min    Comment: (NOTE) The eGFR has been calculated using the CKD EPI equation. This calculation has not been validated in all clinical situations. eGFR's persistently <90 mL/min signify possible Chronic Kidney Disease.    Anion gap 10 5 - 15  Glucose, capillary     Status: Abnormal   Collection Time: 06/07/14 12:11 PM  Result Value Ref Range   Glucose-Capillary 229 (H) 70 - 99 mg/dL  Vancomycin, trough     Status: Abnormal   Collection Time: 06/07/14  1:48 PM  Result Value Ref Range   Vancomycin Tr 28.7 (HH) 10.0 - 20.0 ug/mL    Comment: REPEATED TO VERIFY CRITICAL RESULT CALLED TO, READ BACK BY AND VERIFIED WITH: J NARAMDAS,RN 1516 06/07/14 WBOND   Glucose, capillary     Status: Abnormal   Collection Time: 06/07/14  4:33 PM  Result Value Ref Range   Glucose-Capillary 275 (H) 70 - 99 mg/dL  Glucose, capillary     Status: Abnormal   Collection Time: 06/07/14 10:04 PM  Result Value Ref Range   Glucose-Capillary 257 (H) 70 - 99 mg/dL  Glucose, capillary     Status: Abnormal   Collection  Time: 06/08/14  3:23 AM  Result Value Ref Range   Glucose-Capillary 321 (H) 70 - 99 mg/dL    Dg Chest 2 View  06/07/2014   CLINICAL DATA:  Cough, diabetes, hypertension,  hypercholesterolemia, former smoker  EXAM: CHEST  2 VIEW  COMPARISON:  06/25/2013  FINDINGS: Upper normal heart size.  Mediastinal contours and pulmonary vascularity normal.  Minimal chronic peribronchial thickening without infiltrate, pleural effusion or pneumothorax.  Bones demineralized.  IMPRESSION: Minimal chronic bronchitic changes without acute infiltrate.   Electronically Signed   By: Lavonia Dana M.D.   On: 06/07/2014 11:44    Review of Systems  All other systems reviewed and are negative.  Blood pressure 139/70, pulse 91, temperature 100.1 F (37.8 C), temperature source Oral, resp. rate 18, height $RemoveBe'5\' 7"'BOCOcieCb$  (1.702 m), weight 61.5 kg (135 lb 9.3 oz), last menstrual period 05/21/2014, SpO2 100 %. Physical Exam On examination patient has a Wagner grade 1 ulcer on the plantar aspect left foot there is no redness no cellulitis this does not probe to bone. On the plantar aspect the right foot patient has a fluctuant abscess on the plantar aspect of her foot with cellulitis tenderness to palpation she states the symptoms are better since she's been on antibiotics. Assessment/Plan: Assessment: Abscess plantar aspect right foot.  Plan: We will plan for irrigation and debridement of the right foot abscess as add-on surgery today, approximately 5 PM. Nothing by mouth.  [name] V 06/08/2014, 6:50 AM

## 2014-06-10 NOTE — Anesthesia Procedure Notes (Signed)
Procedure Name: LMA Insertion Date/Time: 06/10/2014 7:44 PM Performed by: Valetta Fuller Pre-anesthesia Checklist: Patient identified, Emergency Drugs available, Suction available and Patient being monitored Patient Re-evaluated:Patient Re-evaluated prior to inductionOxygen Delivery Method: Circle system utilized Preoxygenation: Pre-oxygenation with 100% oxygen Intubation Type: IV induction Ventilation: Mask ventilation without difficulty LMA: LMA inserted LMA Size: 4.0 Number of attempts: 1 Tube secured with: Tape Dental Injury: Teeth and Oropharynx as per pre-operative assessment

## 2014-06-10 NOTE — Anesthesia Preprocedure Evaluation (Addendum)
Anesthesia Evaluation  Patient identified by MRN, date of birth, ID band Patient awake    Reviewed: Allergy & Precautions, NPO status , Patient's Chart, lab work & pertinent test results  History of Anesthesia Complications Negative for: history of anesthetic complications  Airway Mallampati: II   Neck ROM: Full    Dental  (+) Chipped, Dental Advisory Given   Pulmonary COPD COPD inhaler, former smoker (quit 2010),  breath sounds clear to auscultation        Cardiovascular hypertension, Pt. on medications - anginaRhythm:Regular Rate:Normal     Neuro/Psych negative neurological ROS     GI/Hepatic negative GI ROS, Neg liver ROS,   Endo/Other  diabetes (glu 135, insulin pump disconnected), Insulin Dependent  Renal/GU Renal InsufficiencyRenal disease (creat 4.94, K+ 4.0)     Musculoskeletal  (+) Arthritis -,   Abdominal   Peds  Hematology  (+) Blood dyscrasia (Hb 7.1), ,   Anesthesia Other Findings   Reproductive/Obstetrics LMP 05/20/14, pt declines pregnancy testing, says no exposure/relations                            Anesthesia Physical Anesthesia Plan  ASA: III  Anesthesia Plan: General   Post-op Pain Management:    Induction: Intravenous  Airway Management Planned: LMA  Additional Equipment:   Intra-op Plan:   Post-operative Plan:   Informed Consent: I have reviewed the patients History and Physical, chart, labs and discussed the procedure including the risks, benefits and alternatives for the proposed anesthesia with the patient or authorized representative who has indicated his/her understanding and acceptance.   Dental advisory given  Plan Discussed with: CRNA and Surgeon  Anesthesia Plan Comments: (Plan routine monitors, GA- LMA OK)        Anesthesia Quick Evaluation

## 2014-06-10 NOTE — Op Note (Signed)
06/02/2014 - 06/10/2014  8:10 PM  PATIENT:  [name]    PRE-OPERATIVE DIAGNOSIS:  Right Foot Abscess   POST-OPERATIVE DIAGNOSIS:  Same  PROCEDURE:  IRRIGATION AND DEBRIDEMENT Right Foot  SURGEON:  Terissa Haffey V, MD  PHYSICIAN ASSISTANT:None ANESTHESIA:   General  PREOPERATIVE INDICATIONS:  ULYANA HARDBARGER is a  44 y.o. female with a diagnosis of Right Foot Abscess  who failed conservative measures and elected for surgical management.    The risks benefits and alternatives were discussed with the patient preoperatively including but not limited to the risks of infection, bleeding, nerve injury, cardiopulmonary complications, the need for revision surgery, among others, and the patient was willing to proceed.  OPERATIVE IMPLANTS: None  OPERATIVE FINDINGS: Large abscess with nonviable tissue Tissue sent for cultures   OPERATIVE PROCEDURE: Patient is a 44 year old woman with a large abscess in the plantar aspect of her right foot. She has been on IV antibiotics and presents at this time for surgical debridement of the abscess and ulcer. Risks and benefits were discussed including need for additional surgery. Patient states she understands and wishes to proceed at this time.  Patient was brought to operating room and underwent a general anesthetic. After adequate levels of anesthesia were obtained patient's right lower extremity was prepped using DuraPrep draped into a sterile field. A timeout was called. Elliptical incision was made around the necrotic tissue on the plantar aspect of her foot. The final area of excised necrotic tissue was 11 cm x 3 cm. This was debrided down to the deep flexor muscles. The abscess did not extend beyond the deep muscle tissue plane. The wound was irrigated with normal saline. There is very minimal petechial bleeding secondary to peripheral vascular disease. After irrigation the wound was packed open with saline-soaked gauze and a sterile compressive  dressing was applied. Patient was extubated taken to the PACU in stable condition.  Plan to start hydrotherapy tomorrow. Patient will require repeat surgery next week.

## 2014-06-10 NOTE — Progress Notes (Addendum)
Inpatient Diabetes Program Recommendations  AACE/ADA: New Consensus Statement on Inpatient Glycemic Control (2013)  Target Ranges:  Prepandial:   less than 140 mg/dL      Peak postprandial:   less than 180 mg/dL (1-2 hours)      Critically ill patients:  140 - 180 mg/dL     Results for [name], [name] (MRN [id number]) as of 06/10/2014 13:23  Ref. Range 06/10/2014 07:56 06/10/2014 11:33  Glucose-Capillary Latest Range: 70-99 mg/dL 153 (H) 127 (H)     **Patient is NPO for I&D with ortho surgery later today (per RN, patient scheduled for 5pm)  **RN, Mitzi Hansen called to ask DM Coordinator when patient should remove insulin pump for surgery.  If MD wants patient to remove insulin pump for surgery, patient should not remove insulin pump until she is ready to physically leave the nursing unit (unit [place]) to go down to the OR.  Patient should immediately resume insulin pump upon arrival back to nursing unit.  **Typically if surgery is short (less than 1 hour), patient may leave insulin pump on and run basal rates during surgery.    **Reviewed the above information with Mitzi Hansen, RN on [place].  **RN told me patient removed her insulin pump around 12 noon.  RN told me patient stated she took her pump off b/c her blood sugar dropped to 127 mg/dl.  Asked RN to please tell patient to consider putting her pump back on until surgery this evening.  Also asked RN to please check patient's CBGs 1-2 times before surgery tonight.   Will follow Wyn Quaker RN, MSN, CDE Diabetes Coordinator Inpatient Diabetes Program Team Pager: [phone] (8a-5p)

## 2014-06-11 LAB — GLUCOSE, CAPILLARY
GLUCOSE-CAPILLARY: 205 mg/dL — AB (ref 70–99)
GLUCOSE-CAPILLARY: 262 mg/dL — AB (ref 70–99)
GLUCOSE-CAPILLARY: 190 mg/dL — AB (ref 70–99)
Glucose-Capillary: 135 mg/dL — ABNORMAL HIGH (ref 70–99)
Glucose-Capillary: 291 mg/dL — ABNORMAL HIGH (ref 70–99)
Glucose-Capillary: 255 mg/dL — ABNORMAL HIGH (ref 70–99)

## 2014-06-11 LAB — BASIC METABOLIC PANEL
Anion gap: 14 (ref 5–15)
BUN: 37 mg/dL — AB (ref 6–23)
CALCIUM: 7.7 mg/dL — AB (ref 8.4–10.5)
CO2: 23 mmol/L (ref 19–32)
Chloride: 97 mmol/L (ref 96–112)
Creatinine, Ser: 3.99 mg/dL — ABNORMAL HIGH (ref 0.50–1.10)
GFR, EST AFRICAN AMERICAN: 15 mL/min — AB (ref >90)
GFR, EST NON AFRICAN AMERICAN: 13 mL/min — AB (ref >90)
GLUCOSE: 253 mg/dL — AB (ref 70–99)
Potassium: 4.1 mmol/L (ref 3.5–5.1)
Sodium: 134 mmol/L — ABNORMAL LOW (ref 135–145)

## 2014-06-11 LAB — VANCOMYCIN, RANDOM: Vancomycin Rm: 9.4 ug/mL

## 2014-06-11 MED ORDER — VANCOMYCIN HCL IN DEXTROSE 750-5 MG/150ML-% IV SOLN
750.0000 mg | INTRAVENOUS | Status: DC
  Administered 2014-06-11: 750 mg via INTRAVENOUS
  Filled 2014-06-11: qty 150

## 2014-06-11 MED ORDER — DARBEPOETIN ALFA 60 MCG/0.3ML IJ SOSY
60.0000 ug | PREFILLED_SYRINGE | INTRAMUSCULAR | Status: DC
  Administered 2014-06-11: 60 ug via SUBCUTANEOUS
  Filled 2014-06-11 (×3): qty 0.3

## 2014-06-11 MED ORDER — SODIUM CHLORIDE 0.9 % IV SOLN
250.0000 mg | INTRAVENOUS | Status: AC
  Administered 2014-06-11 – 2014-06-17 (×4): 250 mg via INTRAVENOUS
  Filled 2014-06-11 (×7): qty 20

## 2014-06-11 NOTE — Progress Notes (Signed)
Inpatient Diabetes Program Recommendations  AACE/ADA: New Consensus Statement on Inpatient Glycemic Control (2013)  Target Ranges:  Prepandial:   less than 140 mg/dL      Peak postprandial:   less than 180 mg/dL (1-2 hours)      Critically ill patients:  140 - 180 mg/dL    Patient on insulin pump-medtronic. Inspected pump with patient and reviewed settings with her. They are as follows:  Basal rates: 2400-0600 @ 0.325 units/hr 0600-1200 @ 0.4 units/hr 1200-1700 @ 0.6 units/hr 1700-2200 @ 0.5 units/hr 2200-2400 @ 0.6 units/hr Total basal of 10.45 units/day  Bolus wizard settings: Insulin to cho ratio: 1:15 Sensitivity factor : 90 Target glucose goal: 150-150 mg/dL Pump signaling that insulin reservoir is low with 19 units remaining. Pt states she has her insulin to refill her pump and use her own pump supplies.  Glucose running in 200 range presently, but patient is correcting/bolusing as programmed.  Thank you Rosita Kea, RN, MSN, CDE  Diabetes Inpatient Program Office: [phone] Pager: [phone] 8:00 am to 5:00 pm

## 2014-06-11 NOTE — Progress Notes (Signed)
ANTIBIOTIC CONSULT NOTE - FOLLOW UP  Pharmacy Consult for vancomycin Indication: diabetic foot infection w/ cellulitis   Labs:  Recent Labs  06/08/14 0805 06/08/14 1942 06/09/14 0555 06/10/14 0114 06/10/14 1050  WBC 16.0*  --  11.5* 11.8*  --   HGB 7.4*  --  7.3* 7.1*  --   PLT 507*  --  431* 531*  --   LABCREA  --  71.37  72.05  --   --   --   CREATININE 5.39*  --  5.85*  --  4.94*   Estimated Creatinine Clearance: 14.4 mL/min (by C-G formula based on Cr of 4.94).  Recent Labs  06/09/14 0555 06/11/14 0611  VANCORANDOM 20.9 9.4     Microbiology: Recent Results (from the past 720 hour(s))  Blood culture (routine x 2)     Status: None   Collection Time: 06/02/14  6:35 PM  Result Value Ref Range Status   Specimen Description BLOOD LEFT FA  Final   Special Requests BOTTLES DRAWN AEROBIC AND ANAEROBIC 5CC EACH  Final   Culture   Final    NO GROWTH 5 DAYS Performed at [facility]    Report Status 06/09/2014 FINAL  Final  Blood culture (routine x 2)     Status: None   Collection Time: 06/02/14  7:00 PM  Result Value Ref Range Status   Specimen Description BLOOD RIGHT AC  Final   Special Requests BOTTLES DRAWN AEROBIC AND ANAEROBIC 5 CC EACH  Final   Culture   Final    NO GROWTH 5 DAYS Performed at [facility]    Report Status 06/09/2014 FINAL  Final  MRSA PCR Screening     Status: None   Collection Time: 06/03/14 12:12 AM  Result Value Ref Range Status   MRSA by PCR NEGATIVE NEGATIVE Final    Comment:        The GeneXpert MRSA Assay (FDA approved for NASAL specimens only), is one component of a comprehensive MRSA colonization surveillance program. It is not intended to diagnose MRSA infection nor to guide or monitor treatment for MRSA infections.   Clostridium Difficile by PCR     Status: None   Collection Time: 06/06/14 10:12 AM  Result Value Ref Range Status   C difficile by pcr NEGATIVE NEGATIVE Final  Clostridium Difficile by PCR      Status: None   Collection Time: 06/10/14 12:10 AM  Result Value Ref Range Status   C difficile by pcr NEGATIVE NEGATIVE Final      Assessment: 44yo female w/ accumulated vanc levels 2/2 worsening renal function, now w/ level below 10.  Goal of Therapy:  Vancomycin trough level 10-15 mcg/ml  Plan:  Will resume vancomycin at 750mg  IV Q72H for calculated trough ~12.5 though w/ changing renal function this will likely need to change; will continue to monitor closely.  Wynona Neat, PharmD, BCPS  06/11/2014,7:25 AM

## 2014-06-11 NOTE — Progress Notes (Signed)
Patient ID: [name], female   DOB: 02-27-1971, 44 y.o.   MRN: [id number] Start hydrotherapy today for right foot wound.  Plan for skin graft and wound VAC Wednesday.  Necrotic tissue sent for tissue cultures.

## 2014-06-11 NOTE — Progress Notes (Signed)
Chaplain visited with [name]. [name] self-disclosed that she is a very "expressive" person. Throughout the visit she was very open and conversational. Pain and frustration are evident in her expressions and voice inflections although she presents as jolly.   [name] expressed that she's experiencing some major life changes as her foot is healing. Her vehicle is an manual transmission and she cant drive it with one foot. She noted that she has a very active lifestyle and cannot continue to exercise and do activities with friends and her 120lb dog while she waits to heal.   Her complications have also affected her social life. She expressed that she "likes to do things on her own" and really "doesnt like asking for help with things she ought to be able to do on her own." The loss of control is really affecting her as she" is not used to not being in control at all"   Two summers ago [name] suffered a very traumatic event.   She lost "her everything, the joy of her life" Darrick Meigs, her then 6yr old son who completed suicide. She was on the scene and found him after the fact.   Her family is a little bit of everywhere, her mother is in [place], brother in [place] and other siblings and family elsewhere.   She has a sister who lives within two minutes of her however their relationship has been rocky since childhood but she has been to visit.   She finds strength and joy in her church community whom she loves dearly and gives her "space to be herself".   Chaplain provided emotional/spiritual support and explored her anger and resentment.   Will continue to follow.   Delford Field, Chaplain 06/11/2014

## 2014-06-11 NOTE — Progress Notes (Addendum)
PT Wound Evaluation and Treatment.    06/11/14 1234  Subjective Assessment  Subjective oooo that hurts!  Patient and Family Stated Goals to get this healed up  Prior Treatments s/p I&D 3/10  Evaluation and Treatment  Evaluation and Treatment Procedures Explained to Patient/Family Yes  Evaluation and Treatment Procedures agreed to  Wound / Incision (Open or Dehisced) 06/02/14 Diabetic ulcer Foot Right  Date First Assessed/Time First Assessed: 06/02/14 2315   Wound Type: Diabetic ulcer  Location: Foot  Location Orientation: Right  Present on Admission: Yes  Dressing Type ABD;Compression wrap;Gauze (Comment)  Dressing Status Clean;Dry;Intact  Dressing Change Frequency Daily  Site / Wound Assessment Bleeding;Granulation tissue;Pink;Red;Yellow  % Wound base Red or Granulating 80% (with exposed tendon)  % Wound base Yellow 20%  Peri-wound Assessment Maceration;Pink;Purple;Erythema (blanchable) (Peeling skin; scaley)  Margins Unattached edges (unapproximated)  Closure None  Drainage Amount Copious  Drainage Description Sanguineous;No odor;Serosanguineous  Hydrotherapy  Pulsed Lavage with Suction (psi) 4 psi (Pt with low pain tolerance)  Pulsed Lavage with Suction - Normal Saline Used 1000 mL  Pulsed Lavage Tip Tip with splash shield  Pulsed lavage therapy - wound location plantar surface right foot  Selective Debridement  Selective Debridement - Location plantar surface right foot  Selective Debridement - Tools Used Scissors;Forceps  Selective Debridement - Tissue Removed fibrin, slough  Wound Therapy - Assess/Plan/Recommendations  Wound Therapy - Clinical Statement Pt s/p I&D 3/10. Wound bed is mostly red, granulation tissue with necrotic fibrin and slough along the borders of the wound. Pt would benefit from PLS to cleanse the wound bed to allow for selective debridement, prevent further I&D's in the OR and promote wound healing.  Wound Therapy - Functional Problem List DM; decreased  mobility secondary to NWB RLE  Factors Delaying/Impairing Wound Healing Diabetes Mellitus;Altered sensation  Hydrotherapy Plan Debridement;Dressing change;Patient/family education;Pulsatile lavage with suction  Wound Therapy - Frequency 6X / week  Wound Therapy - Current Recommendations WOC nurse;Surgery consult;PT  Wound Therapy - Follow Up Recommendations Wound Care Center;Home health RN  Wound Plan see above  Wound Therapy Goals - Improve the function of patient's integumentary system by progressing the wound(s) through the phases of wound healing by:  Decrease Necrotic Tissue to <10%  Increase Granulation Tissue to >90%  Improve Drainage Characteristics Min  Goals/treatment plan/discharge plan were made with and agreed upon by patient/family Yes  Time For Goal Achievement 7 days  Wound Therapy - Potential for Goals Good    Candy Sledge, PT, DPT [phone]

## 2014-06-11 NOTE — Progress Notes (Signed)
Patient ID: [name], female   DOB: 03/03/1971, 44 y.o.   MRN: [id number]  [facility] Progress Note    Assessment/ Plan:   1. Acute renal failure on chronic kidney disease stage III: This is suspected to be multifactorial from possibly nephrotoxic ATN (low likelihood of vancomycin), SIRS associated ATN or postinfectious GN. This is all in the background of diabetic kidney disease. Renal function improving with conservative measures-no indications for dialysis at this time. Electrolytes are noncritical at this time. 2. Right lower extremity cellulitis/possible abscess- status post incision and drainage yesterday and to start getting hydrotherapy today 3. Hyponatremia: Pseudohyponatremia secondary to hyperglycemia and true hyponatremia secondary to acute renal failure and defective free water excretion- we'll continue to monitor sodium trend-difficult to fluid restrict her 4. Anemia: Start ESA today (ordered yesterday but not given), start intravenous iron  Subjective:   Reports to be feeling fair-some discomfort from recent surgery    Objective:   BP 115/50 mmHg  Pulse 75  Temp(Src) 98.9 F (37.2 C) (Oral)  Resp 18  Ht 5\' 7"  (1.702 m)  Wt 62.823 kg (138 lb 8 oz)  BMI 21.69 kg/m2  SpO2 97%  LMP 05/21/2014  Intake/Output Summary (Last 24 hours) at 06/11/14 1005 Last data filed at 06/11/14 0700  Gross per 24 hour  Intake    820 ml  Output   2500 ml  Net  -1680 ml   Weight change:   Physical Exam: Gen: Comfortably resting in bed CVS: Pulse regular in rate and rhythm Resp: Clear to auscultation, no rales Abd: Soft, flat, nontender Ext: No lower extremity edema, right foot in an Ace wrap.  Imaging: No results found.  Labs: BMET  Recent Labs Lab 06/05/14 0450 06/06/14 1146 06/07/14 1111 06/08/14 0805 06/09/14 0555 06/10/14 1050 06/11/14 0611  NA 132* 131* 128* 132* 132* 137 134*  K 4.9 5.1 4.9 4.8 4.8 4.0 4.1  CL 103 102 101 103 101 101 97   CO2 15* 18* 17* 18* 19 24 23   GLUCOSE 186* 240* 254* 184* 168* 146* 253*  BUN 35* 40* 45* 52* 56* 45* 37*  CREATININE 2.08* 3.09* 4.05* 5.39* 5.85* 4.94* 3.99*  CALCIUM 8.3* 7.9* 8.1* 8.1* 8.0* 7.6* 7.7*   CBC  Recent Labs Lab 06/07/14 1111 06/08/14 0805 06/09/14 0555 06/10/14 0114  WBC 15.4* 16.0* 11.5* 11.8*  HGB 9.0* 7.4* 7.3* 7.1*  HCT 27.4* 22.9* 23.0* 22.3*  MCV 76.1* 77.6* 79.9 77.7*  PLT 549* 507* 431* 531*    Medications:    . benzonatate  100 mg Oral TID  . calcitRIOL  0.25 mcg Oral Daily  . darbepoetin (ARANESP) injection - NON-DIALYSIS  60 mcg Subcutaneous Q Fri-1800  . docusate sodium  100 mg Oral BID  . ferric gluconate (FERRLECIT/NULECIT) IV  250 mg Intravenous QODAY  . folic acid  1 mg Oral Daily  . gabapentin  300 mg Oral QHS  . heparin subcutaneous  5,000 Units Subcutaneous 3 times per day  . insulin pump   Subcutaneous TID AC, HS, 0200  . loratadine  10 mg Oral Daily  . metoCLOPramide (REGLAN) injection  5 mg Intravenous 3 times per day  . pantoprazole  40 mg Oral Q1200  . piperacillin-tazobactam (ZOSYN)  IV  2.25 g Intravenous 3 times per day  . pravastatin  20 mg Oral Daily  . vancomycin  750 mg Intravenous Q72H  . vitamin B-12  100 mcg Oral Daily   Elmarie Shiley, MD 06/11/2014, 10:05 AM

## 2014-06-11 NOTE — Progress Notes (Signed)
[facility] PROGRESS NOTE  MALIEKA HOLNESS [id number] DOB: 1970/09/04 DOA: 06/02/2014 PCP: No primary care provider on file.  Assessment/Plan:    Cellulitis of right foot: MRI without osteomyelitis or deep abscess. ABIs done-- s/p irrigation and debridement of the right foot abscess  -appreciate Dr. 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- consult renal- improving   DM type 1 causing renal disease Chronic anemia: resume iron, given dose of aranesp -routine check of HIV antibody negative  HPI/Subjective: Coughing less  Objective: Filed Vitals:   06/11/14 0457  BP: 115/50  Pulse: 75  Temp: 98.9 F (37.2 C)  Resp: 18    Intake/Output Summary (Last 24 hours) at 06/11/14 1046 Last data filed at 06/11/14 0700  Gross per 24 hour  Intake    820 ml  Output   2500 ml  Net  -1680 ml   Filed Weights   06/06/14 2114 06/07/14 2100 06/08/14 2100  Weight: 61.236 kg (135 lb) 61.5 kg (135 lb 9.3 oz) 62.823 kg (138 lb 8 oz)    Exam:   General:  Poor insight into disease process  Cardiovascular: rrr without mgr  Respiratory: cta without wrr  Abdomen: s, nt, nd  Ext:  Getting hydrotherapy  Basic Metabolic Panel:  Recent Labs Lab 06/07/14 1111 06/08/14 0805 06/09/14 0555 06/10/14 1050 06/11/14 0611  NA 128* 132* 132* 137 134*  K 4.9 4.8 4.8 4.0 4.1  CL 101 103 101 101 97  CO2 17* 18* 19 24 23   GLUCOSE 254* 184* 168* 146* 253*  BUN 45* 52* 56* 45* 37*  CREATININE 4.05* 5.39* 5.85* 4.94* 3.99*  CALCIUM 8.1* 8.1* 8.0* 7.6* 7.7*   Liver Function Tests: No results for input(s): AST, ALT, ALKPHOS, BILITOT, PROT, ALBUMIN in the last 168 hours. No results for input(s): LIPASE, AMYLASE in the last 168 hours. No results for input(s): AMMONIA in the last  168 hours. CBC:  Recent Labs Lab 06/06/14 1146 06/07/14 1111 06/08/14 0805 06/09/14 0555 06/10/14 0114  WBC 16.3* 15.4* 16.0* 11.5* 11.8*  HGB 7.4* 9.0* 7.4* 7.3* 7.1*  HCT 23.1* 27.4* 22.9* 23.0* 22.3*  MCV 80.2 76.1* 77.6* 79.9 77.7*  PLT 476* 549* 507* 431* 531*   Cardiac Enzymes: No results for input(s): CKTOTAL, CKMB, CKMBINDEX, TROPONINI in the last 168 hours. BNP (last 3 results) No results for input(s): BNP in the last 8760 hours.  ProBNP (last 3 results) No results for input(s): PROBNP in the last 8760 hours.  CBG:  Recent Labs Lab 06/10/14 1911 06/10/14 2019 06/10/14 2053 06/11/14 0205 06/11/14 0822  GLUCAP 94 90 96 291* 255*    Recent Results (from the past 240 hour(s))  Blood culture (routine x 2)     Status: None   Collection Time: 06/02/14  6:35 PM  Result Value Ref Range Status   Specimen Description BLOOD LEFT FA  Final   Special Requests BOTTLES DRAWN AEROBIC AND ANAEROBIC 5CC EACH  Final   Culture   Final    NO GROWTH 5 DAYS Performed at [facility]    Report Status 06/09/2014 FINAL  Final  Blood culture (routine x 2)     Status: None   Collection Time: 06/02/14  7:00 PM  Result Value Ref Range Status   Specimen Description BLOOD RIGHT [facility]  Final  Special Requests BOTTLES DRAWN AEROBIC AND ANAEROBIC 5 CC EACH  Final   Culture   Final    NO GROWTH 5 DAYS Performed at [facility]    Report Status 06/09/2014 FINAL  Final  MRSA PCR Screening     Status: None   Collection Time: 06/03/14 12:12 AM  Result Value Ref Range Status   MRSA by PCR NEGATIVE NEGATIVE Final    Comment:        The GeneXpert MRSA Assay (FDA approved for NASAL specimens only), is one component of a comprehensive MRSA colonization surveillance program. It is not intended to diagnose MRSA infection nor to guide or monitor treatment for MRSA infections.   Clostridium Difficile by PCR     Status: None   Collection Time: 06/06/14 10:12 AM  Result  Value Ref Range Status   C difficile by pcr NEGATIVE NEGATIVE Final  Clostridium Difficile by PCR     Status: None   Collection Time: 06/10/14 12:10 AM  Result Value Ref Range Status   C difficile by pcr NEGATIVE NEGATIVE Final     Studies: No results found.  Scheduled Meds: . benzonatate  100 mg Oral TID  . calcitRIOL  0.25 mcg Oral Daily  . darbepoetin (ARANESP) injection - NON-DIALYSIS  60 mcg Subcutaneous Q Fri-1800  . docusate sodium  100 mg Oral BID  . ferric gluconate (FERRLECIT/NULECIT) IV  250 mg Intravenous QODAY  . folic acid  1 mg Oral Daily  . gabapentin  300 mg Oral QHS  . heparin subcutaneous  5,000 Units Subcutaneous 3 times per day  . insulin pump   Subcutaneous TID AC, HS, 0200  . loratadine  10 mg Oral Daily  . metoCLOPramide (REGLAN) injection  5 mg Intravenous 3 times per day  . pantoprazole  40 mg Oral Q1200  . piperacillin-tazobactam (ZOSYN)  IV  2.25 g Intravenous 3 times per day  . pravastatin  20 mg Oral Daily  . vancomycin  750 mg Intravenous Q72H  . vitamin B-12  100 mcg Oral Daily   Continuous Infusions: .  sodium bicarbonate 150 mEq in sterile water 1000 mL infusion 75 mL/hr at 06/11/14 0751    Time spent: 25 minutes  Geroge Gilliam  [facility] [email or web address], password [facility] 06/11/2014, 10:46 AM  LOS: 9 days

## 2014-06-11 NOTE — Evaluation (Signed)
Physical Therapy Evaluation and Discharge Patient Details Name: [name] MRN: [id number] DOB: 10-09-1970 Today's Date: 06/11/2014   History of Present Illness  Pt is a 44 y.o. female with h/o poorly controlled DM1, osteomyelitis of left foot due to diabetic foot ulcer. Patient presents with worsening blister and cellulitis of her right foot due to a diabetic foot ulcer. X ray was negative for osteomyelitis. There have been associated fever and chills over the past week or so.She has been on bactrim for her other foot for about 2 weeks now.Other foot wound is healing she states.She saw wound care doc morning of admission who sent her in to the ED for evaluation. Pt is now s/p I&D of the R foot.  Clinical Impression  Patient evaluated by Physical Therapy with no further acute PT needs identified. All education has been completed and the patient has no further questions. At the time of PT eval pt demonstrated the ability to transfer bed<>chair with modified independence and perform bed mobility with independence. Pt agitated with therapist and states she does not need physical therapy "whatsoever". Encouraged pt to sit up in the chair and get up to the bedside commode, howvever pt declined. Pt called for bed pan as soon as session ended. See below for any follow-up Physial Therapy or equipment needs. PT is signing off. Thank you for this referral.       Follow Up Recommendations No PT follow up    Equipment Recommendations  None recommended by PT    Recommendations for Other Services       Precautions / Restrictions Precautions Precautions: Fall Restrictions Weight Bearing Restrictions: Yes RLE Weight Bearing: Non weight bearing Other Position/Activity Restrictions: No WB status specified for L foot.       Mobility  Bed Mobility Overal bed mobility: Independent                Transfers Overall transfer level: Modified independent Equipment used: None              General transfer comment: Pt was able to scoot transfer from bed to drop arm recliner with no physical assist. Pt was cued to maintain NWB on bilateral feet to be conservative until clarification is received. Used heel of L foot for leverage when scooting.   Ambulation/Gait             General Gait Details: Deferred as it is unclear whether pt is NWB on bilateral LE's or just the R at this time.   Stairs            Wheelchair Mobility    Modified Rankin (Stroke Patients Only)       Balance Overall balance assessment: No apparent balance deficits (not formally assessed)                                           Pertinent Vitals/Pain Pain Assessment: 0-10 Pain Score: 0-No pain    Home Living Family/patient expects to be discharged to:: Private residence Living Arrangements: Non-relatives/Friends Available Help at Discharge: Friend(s);Available PRN/intermittently Type of Home: House [facility]) Home Access: Stairs to enter Entrance Stairs-Rails: None Entrance Stairs-Number of Steps: 3 Home Layout: Two level Home Equipment: Crutches      Prior Function Level of Independence: Independent         Comments: Does not work, still driving, not limited with distance  Hand Dominance   Dominant Hand: Right    Extremity/Trunk Assessment   Upper Extremity Assessment: Overall WFL for tasks assessed           Lower Extremity Assessment: Overall WFL for tasks assessed (wounds in bilateral feet)      Cervical / Trunk Assessment: Normal  Communication   Communication: No difficulties  Cognition Arousal/Alertness: Awake/alert Behavior During Therapy: WFL for tasks assessed/performed Overall Cognitive Status: Within Functional Limits for tasks assessed                      General Comments      Exercises        Assessment/Plan    PT Assessment Patent does not need any further PT services  PT Diagnosis  Difficulty walking   PT Problem List    PT Treatment Interventions     PT Goals (Current goals can be found in the Care Plan section) Acute Rehab PT Goals PT Goal Formulation: All assessment and education complete, DC therapy    Frequency     Barriers to discharge        Co-evaluation               End of Session   Activity Tolerance: Patient tolerated treatment well Patient left: in bed;with call bell/phone within reach Nurse Communication: Mobility status         Time: [id number] PT Time Calculation (min) (ACUTE ONLY): 25 min   Charges:   PT Evaluation $Initial PT Evaluation Tier I: 1 Procedure PT Treatments $Therapeutic Activity: 8-22 mins   PT G Codes:        Rolinda Roan June 22, 2014, 11:26 AM  Rolinda Roan, PT, DPT Acute Rehabilitation Services Pager: [phone]

## 2014-06-12 LAB — GLUCOSE, CAPILLARY
Glucose-Capillary: 124 mg/dL — ABNORMAL HIGH (ref 70–99)
Glucose-Capillary: 184 mg/dL — ABNORMAL HIGH (ref 70–99)
Glucose-Capillary: 199 mg/dL — ABNORMAL HIGH (ref 70–99)
Glucose-Capillary: 204 mg/dL — ABNORMAL HIGH (ref 70–99)
Glucose-Capillary: 237 mg/dL — ABNORMAL HIGH (ref 70–99)
Glucose-Capillary: 305 mg/dL — ABNORMAL HIGH (ref 70–99)

## 2014-06-12 LAB — BASIC METABOLIC PANEL
Anion gap: 12 (ref 5–15)
BUN: 29 mg/dL — AB (ref 6–23)
CO2: 24 mmol/L (ref 19–32)
Calcium: 8.2 mg/dL — ABNORMAL LOW (ref 8.4–10.5)
Chloride: 99 mmol/L (ref 96–112)
Creatinine, Ser: 3.28 mg/dL — ABNORMAL HIGH (ref 0.50–1.10)
GFR calc Af Amer: 19 mL/min — ABNORMAL LOW (ref >90)
GFR, EST NON AFRICAN AMERICAN: 16 mL/min — AB (ref >90)
GLUCOSE: 245 mg/dL — AB (ref 70–99)
Potassium: 4 mmol/L (ref 3.5–5.1)
SODIUM: 135 mmol/L (ref 135–145)

## 2014-06-12 LAB — VANCOMYCIN, RANDOM: Vancomycin Rm: 16.2 ug/mL

## 2014-06-12 MED ORDER — VANCOMYCIN HCL IN DEXTROSE 750-5 MG/150ML-% IV SOLN
750.0000 mg | Freq: Once | INTRAVENOUS | Status: AC
  Administered 2014-06-13: 750 mg via INTRAVENOUS
  Filled 2014-06-12 (×2): qty 150

## 2014-06-12 NOTE — Progress Notes (Signed)
Physical Therapy Wound Treatment Patient Details  Name: [name] MRN: [id number] Date of Birth: 1971/03/03  Today's Date: 06/12/2014 Time: 7209-4709 Time Calculation (min): 52 min  Subjective  Subjective: It really hurt yesterday, I need all the pain medicine I can get before you start. Patient and Family Stated Goals: to get this healed up Prior Treatments: s/p I&D 3/10  Pain Score:   2/10 prior to treatment/IV pain meds; 0/10 after treatment (tolerated well; slept)  Wound Assessment  Wound / Incision (Open or Dehisced) 06/02/14 Diabetic ulcer Foot Right (Active)  Dressing Type ABD;Compression wrap;Gauze (Comment) 06/12/2014  8:38 AM  Dressing Changed Changed 06/12/2014  8:38 AM  Dressing Status Clean;Dry;Intact 06/12/2014  8:38 AM  Dressing Change Frequency Daily 06/12/2014  8:38 AM  Site / Wound Assessment Bleeding;Granulation tissue;Pink;Red;Yellow 06/12/2014  8:38 AM  % Wound base Red or Granulating 70% 06/12/2014  8:38 AM  % Wound base Yellow 30% 06/12/2014  8:38 AM  % Wound base Black 15% 06/08/2014  8:00 AM  % Wound base Other (Comment) 10% 06/02/2014 11:12 PM  Peri-wound Assessment Maceration;Pink;Purple;Erythema (blanchable) 06/12/2014  8:38 AM  Wound Length (cm) 9.8 cm 06/11/2014 12:00 PM  Wound Width (cm) 3 cm 06/11/2014 12:00 PM  Wound Depth (cm) 2.5 cm 06/11/2014 12:00 PM  Margins Unattached edges (unapproximated) 06/12/2014  8:38 AM  Closure None 06/12/2014  8:38 AM  Drainage Amount Copious 06/12/2014  8:38 AM  Drainage Description No odor;Serosanguineous 06/12/2014  8:38 AM  Non-staged Wound Description Full thickness 06/12/2014  8:38 AM  Treatment Debridement (Selective);Hydrotherapy (Pulse lavage);Packing (Saline gauze) 06/12/2014  8:38 AM      Hydrotherapy Pulsed lavage therapy - wound location: plantar surface right foot Pulsed Lavage with Suction (psi): 4 psi (to 8) Pulsed Lavage with Suction - Normal Saline Used: 1000 mL Pulsed Lavage Tip: Tip with splash  shield Selective Debridement Selective Debridement - Location: plantar surface right foot Selective Debridement - Tools Used: Scissors;Forceps Selective Debridement - Tissue Removed: fibrin, slough   Wound Assessment and Plan  Wound Therapy - Assess/Plan/Recommendations Wound Therapy - Clinical Statement: Wound edges remain necrotic/dusky with apparent incr in slough/necrosis compared to 3/11.  Wound Therapy - Functional Problem List: DM; decreased mobility secondary to NWB RLE Factors Delaying/Impairing Wound Healing: Diabetes Mellitus;Altered sensation Hydrotherapy Plan: Debridement;Dressing change;Patient/family education;Pulsatile lavage with suction Wound Therapy - Frequency: 6X / week Wound Therapy - Current Recommendations: WOC nurse;Surgery consult;PT Wound Therapy - Follow Up Recommendations: Wound Care Center;Home health RN Wound Plan: see above  Wound Therapy Goals- Improve the function of patient's integumentary system by progressing the wound(s) through the phases of wound healing (inflammation - proliferation - remodeling) by: Decrease Necrotic Tissue to: <10% Decrease Necrotic Tissue - Progress: Progressing toward goal Increase Granulation Tissue to: >90% Increase Granulation Tissue - Progress: Progressing toward goal Improve Drainage Characteristics: Min Improve Drainage Characteristics - Progress: Progressing toward goal  Goals will be updated until maximal potential achieved or discharge criteria met.  Discharge criteria: when goals achieved, discharge from hospital, MD decision/surgical intervention, no progress towards goals, refusal/missing three consecutive treatments without notification or medical reason.  GP     [name] 06/12/2014, 9:39 AM Pager [phone]

## 2014-06-12 NOTE — Progress Notes (Signed)
Pt stated that she gave herself 1 unit bolus of insulin from her insulin pump with breakfast.

## 2014-06-12 NOTE — Progress Notes (Signed)
[facility] PROGRESS NOTE  [name] [id number] DOB: 03-30-1971 DOA: 06/02/2014 PCP: No primary care provider on file.  Assessment/Plan:    Cellulitis of right foot: MRI without osteomyelitis or deep abscess. ABIs done-- s/p irrigation and debridement of the right foot abscess  -appreciate Dr. 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- consult renal- improving   DM type 1 causing renal disease Chronic anemia: resume iron, given dose of aranesp -routine check of HIV antibody negative  HPI/Subjective: Asking about wearing high heels and how she was going to be able to get around at home  Objective: Filed Vitals:   06/12/14 0912  BP: 135/66  Pulse: 70  Temp: 98.2 F (36.8 C)  Resp: 16    Intake/Output Summary (Last 24 hours) at 06/12/14 1149 Last data filed at 06/12/14 0900  Gross per 24 hour  Intake    360 ml  Output   2650 ml  Net  -2290 ml   Filed Weights   06/06/14 2114 06/07/14 2100 06/08/14 2100  Weight: 61.236 kg (135 lb) 61.5 kg (135 lb 9.3 oz) 62.823 kg (138 lb 8 oz)    Exam:   General:  Poor insight into disease process  Cardiovascular: rrr without mgr  Respiratory: cta without wrr  Abdomen: s, nt, nd  Ext:  wrapped  Basic Metabolic Panel:  Recent Labs Lab 06/08/14 0805 06/09/14 0555 06/10/14 1050 06/11/14 0611 06/12/14 0546  NA 132* 132* 137 134* 135  K 4.8 4.8 4.0 4.1 4.0  CL 103 101 101 97 99  CO2 18* 19 24 23 24   GLUCOSE 184* 168* 146* 253* 245*  BUN 52* 56* 45* 37* 29*  CREATININE 5.39* 5.85* 4.94* 3.99* 3.28*  CALCIUM 8.1* 8.0* 7.6* 7.7* 8.2*   Liver Function Tests: No results for input(s): AST, ALT, ALKPHOS, BILITOT, PROT, ALBUMIN in the last 168 hours. No results for input(s): LIPASE, AMYLASE in the last  168 hours. No results for input(s): AMMONIA in the last 168 hours. CBC:  Recent Labs Lab 06/06/14 1146 06/07/14 1111 06/08/14 0805 06/09/14 0555 06/10/14 0114  WBC 16.3* 15.4* 16.0* 11.5* 11.8*  HGB 7.4* 9.0* 7.4* 7.3* 7.1*  HCT 23.1* 27.4* 22.9* 23.0* 22.3*  MCV 80.2 76.1* 77.6* 79.9 77.7*  PLT 476* 549* 507* 431* 531*   Cardiac Enzymes: No results for input(s): CKTOTAL, CKMB, CKMBINDEX, TROPONINI in the last 168 hours. BNP (last 3 results) No results for input(s): BNP in the last 8760 hours.  ProBNP (last 3 results) No results for input(s): PROBNP in the last 8760 hours.  CBG:  Recent Labs Lab 06/11/14 1607 06/11/14 2024 06/12/14 0003 06/12/14 0415 06/12/14 0750  GLUCAP 262* 190* 204* 237* 199*    Recent Results (from the past 240 hour(s))  Blood culture (routine x 2)     Status: None   Collection Time: 06/02/14  6:35 PM  Result Value Ref Range Status   Specimen Description BLOOD LEFT FA  Final   Special Requests BOTTLES DRAWN AEROBIC AND ANAEROBIC 5CC EACH  Final   Culture   Final    NO GROWTH 5 DAYS Performed at [facility]    Report Status 06/09/2014 FINAL  Final  Blood culture (routine x 2)     Status: None   Collection Time: 06/02/14  7:00 PM  Result  Value Ref Range Status   Specimen Description BLOOD RIGHT [facility]  Final   Special Requests BOTTLES DRAWN AEROBIC AND ANAEROBIC 5 CC EACH  Final   Culture   Final    NO GROWTH 5 DAYS Performed at [facility]    Report Status 06/09/2014 FINAL  Final  MRSA PCR Screening     Status: None   Collection Time: 06/03/14 12:12 AM  Result Value Ref Range Status   MRSA by PCR NEGATIVE NEGATIVE Final    Comment:        The GeneXpert MRSA Assay (FDA approved for NASAL specimens only), is one component of a comprehensive MRSA colonization surveillance program. It is not intended to diagnose MRSA infection nor to guide or monitor treatment for MRSA infections.   Clostridium Difficile by PCR      Status: None   Collection Time: 06/06/14 10:12 AM  Result Value Ref Range Status   C difficile by pcr NEGATIVE NEGATIVE Final  Clostridium Difficile by PCR     Status: None   Collection Time: 06/10/14 12:10 AM  Result Value Ref Range Status   C difficile by pcr NEGATIVE NEGATIVE Final  Tissue culture     Status: None (Preliminary result)   Collection Time: 06/10/14  7:56 PM  Result Value Ref Range Status   Specimen Description TISSUE FOOT RIGHT  Final   Special Requests PATIENT ON FOLLOWING PIPERACILLEN  Final   Gram Stain   Final    FEW WBC PRESENT,BOTH PMN AND MONONUCLEAR NO SQUAMOUS EPITHELIAL CELLS SEEN NO ORGANISMS SEEN Performed at [facility]    Culture PENDING  Incomplete   Report Status PENDING  Incomplete     Studies: No results found.  Scheduled Meds: . benzonatate  100 mg Oral TID  . calcitRIOL  0.25 mcg Oral Daily  . darbepoetin (ARANESP) injection - NON-DIALYSIS  60 mcg Subcutaneous Q Fri-1800  . docusate sodium  100 mg Oral BID  . ferric gluconate (FERRLECIT/NULECIT) IV  250 mg Intravenous QODAY  . folic acid  1 mg Oral Daily  . gabapentin  300 mg Oral QHS  . heparin subcutaneous  5,000 Units Subcutaneous 3 times per day  . insulin pump   Subcutaneous TID AC, HS, 0200  . loratadine  10 mg Oral Daily  . pantoprazole  40 mg Oral Q1200  . piperacillin-tazobactam (ZOSYN)  IV  2.25 g Intravenous 3 times per day  . pravastatin  20 mg Oral Daily  . vancomycin  750 mg Intravenous Once  . vitamin B-12  100 mcg Oral Daily   Continuous Infusions:    Time spent: 25 minutes  VANN, JESSICA  [facility] [email or web address], password [facility] 06/12/2014, 11:49 AM  LOS: 10 days

## 2014-06-12 NOTE — Progress Notes (Signed)
Patient ID: [name], female   DOB: 04-08-1970, 44 y.o.   MRN: [id number]  [facility] Progress Note    Assessment/ Plan:   1. Acute renal failure on chronic kidney disease stage III: This is suspected to be multifactorial from possibly nephrotoxic ATN (low likelihood of vancomycin), SIRS associated ATN or postinfectious GN. This is all in the background of diabetic kidney disease. Renal function continues to show slow and gradual improvement with excellent urine output-no indications for dialysis. 2. Right lower extremity cellulitis/possible abscess- status post incision and drainage yesterday and to start getting hydrotherapy  3. Hyponatremia: Secondary to acute renal failure/hyperglycemia induced pseudohyponatremia-corrected with improving renal function/urine output 4. Anemia: Ongoing intravenous iron therapy-started ESA  Subjective:   Reports to be feeling well-some discomfort at surgical site    Objective:   BP 135/66 mmHg  Pulse 70  Temp(Src) 98.2 F (36.8 C) (Oral)  Resp 16  Ht 5\' 7"  (1.702 m)  Wt 62.823 kg (138 lb 8 oz)  BMI 21.69 kg/m2  SpO2 98%  LMP 05/21/2014  Intake/Output Summary (Last 24 hours) at 06/12/14 0956 Last data filed at 06/12/14 0900  Gross per 24 hour  Intake    360 ml  Output   2650 ml  Net  -2290 ml   Weight change:   Physical Exam: Gen: Comfortably resting in bed CVS: Pulse regular in rate and rhythm Resp: Coarse breath sounds-no rales Abd: Soft, flat, nontender Ext: No lower extremity edema-right foot in Ace wrap dressing  Imaging: No results found.  Labs: BMET  Recent Labs Lab 06/06/14 1146 06/07/14 1111 06/08/14 0805 06/09/14 0555 06/10/14 1050 06/11/14 0611 06/12/14 0546  NA 131* 128* 132* 132* 137 134* 135  K 5.1 4.9 4.8 4.8 4.0 4.1 4.0  CL 102 101 103 101 101 97 99  CO2 18* 17* 18* 19 24 23 24   GLUCOSE 240* 254* 184* 168* 146* 253* 245*  BUN 40* 45* 52* 56* 45* 37* 29*  CREATININE 3.09* 4.05*  5.39* 5.85* 4.94* 3.99* 3.28*  CALCIUM 7.9* 8.1* 8.1* 8.0* 7.6* 7.7* 8.2*   CBC  Recent Labs Lab 06/07/14 1111 06/08/14 0805 06/09/14 0555 06/10/14 0114  WBC 15.4* 16.0* 11.5* 11.8*  HGB 9.0* 7.4* 7.3* 7.1*  HCT 27.4* 22.9* 23.0* 22.3*  MCV 76.1* 77.6* 79.9 77.7*  PLT 549* 507* 431* 531*    Medications:    . benzonatate  100 mg Oral TID  . calcitRIOL  0.25 mcg Oral Daily  . darbepoetin (ARANESP) injection - NON-DIALYSIS  60 mcg Subcutaneous Q Fri-1800  . docusate sodium  100 mg Oral BID  . ferric gluconate (FERRLECIT/NULECIT) IV  250 mg Intravenous QODAY  . folic acid  1 mg Oral Daily  . gabapentin  300 mg Oral QHS  . heparin subcutaneous  5,000 Units Subcutaneous 3 times per day  . insulin pump   Subcutaneous TID AC, HS, 0200  . loratadine  10 mg Oral Daily  . pantoprazole  40 mg Oral Q1200  . piperacillin-tazobactam (ZOSYN)  IV  2.25 g Intravenous 3 times per day  . pravastatin  20 mg Oral Daily  . vancomycin  750 mg Intravenous Once  . vitamin B-12  100 mcg Oral Daily   Elmarie Shiley, MD 06/12/2014, 9:56 AM

## 2014-06-13 ENCOUNTER — Encounter (HOSPITAL_COMMUNITY): Payer: Self-pay | Admitting: Orthopedic Surgery

## 2014-06-13 DIAGNOSIS — N189 Chronic kidney disease, unspecified: Secondary | ICD-10-CM

## 2014-06-13 DIAGNOSIS — N179 Acute kidney failure, unspecified: Secondary | ICD-10-CM

## 2014-06-13 LAB — BASIC METABOLIC PANEL
Anion gap: 9 (ref 5–15)
BUN: 26 mg/dL — ABNORMAL HIGH (ref 6–23)
CALCIUM: 8.1 mg/dL — AB (ref 8.4–10.5)
CO2: 25 mmol/L (ref 19–32)
Chloride: 102 mmol/L (ref 96–112)
Creatinine, Ser: 2.98 mg/dL — ABNORMAL HIGH (ref 0.50–1.10)
GFR, EST AFRICAN AMERICAN: 21 mL/min — AB (ref >90)
GFR, EST NON AFRICAN AMERICAN: 18 mL/min — AB (ref >90)
Glucose, Bld: 307 mg/dL — ABNORMAL HIGH (ref 70–99)
Potassium: 4.2 mmol/L (ref 3.5–5.1)
SODIUM: 136 mmol/L (ref 135–145)

## 2014-06-13 LAB — GLUCOSE, CAPILLARY
GLUCOSE-CAPILLARY: 273 mg/dL — AB (ref 70–99)
GLUCOSE-CAPILLARY: 321 mg/dL — AB (ref 70–99)
Glucose-Capillary: 210 mg/dL — ABNORMAL HIGH (ref 70–99)
Glucose-Capillary: 267 mg/dL — ABNORMAL HIGH (ref 70–99)
Glucose-Capillary: 332 mg/dL — ABNORMAL HIGH (ref 70–99)
Glucose-Capillary: 86 mg/dL (ref 70–99)

## 2014-06-13 LAB — VANCOMYCIN, RANDOM: VANCOMYCIN RM: 21.4 ug/mL

## 2014-06-13 MED ORDER — PIPERACILLIN-TAZOBACTAM 3.375 G IVPB
3.3750 g | Freq: Three times a day (TID) | INTRAVENOUS | Status: DC
  Administered 2014-06-13 – 2014-06-18 (×14): 3.375 g via INTRAVENOUS
  Filled 2014-06-13 (×17): qty 50

## 2014-06-13 NOTE — Progress Notes (Addendum)
Patient ID: [name], female   DOB: 08/28/1970, 44 y.o.   MRN: [id number]  [facility] Progress Note    Assessment/ Plan:   1. Acute renal failure on chronic kidney disease stage III: This is suspected to be primarily SIRS associated ATN or postinfectious GN (low likelihood nephrotoxic ATN from vancomycin given marginally elevated trough levels). Renal function continues to improve slowly with conservative management/supportive care and urine output is good. There are no current indications for hemodialysis. Anticipate continued renal recovery back to her baseline (creatinine around 1.3) 2. Right lower extremity cellulitis/possible abscess- status post incision and drainage and getting hydrotherapy-plans in place for skin grafting and wound VAC on Wednesday  3. Hyponatremia: Secondary to acute renal failure/hyperglycemia induced pseudohyponatremia-corrected with improving renal function/urine output 4. Anemia: Ongoing intravenous iron therapy-started ESA  Will sign off at this time (Discussed with Dr.Vann). She will follow up with Dr.Webb as scheduled upon DC. Please reconsult if it can assist further in her management.  Subjective:   Reports to be feeling better-events from overnight noted, unfortunately bearing weight on leg    Objective:   BP 146/66 mmHg  Pulse 70  Temp(Src) 98.3 F (36.8 C) (Oral)  Resp 16  Ht 5\' 7"  (1.702 m)  Wt 62.823 kg (138 lb 8 oz)  BMI 21.69 kg/m2  SpO2 99%  LMP 05/21/2014  Intake/Output Summary (Last 24 hours) at 06/13/14 0959 Last data filed at 06/13/14 0554  Gross per 24 hour  Intake   1040 ml  Output   2050 ml  Net  -1010 ml   Weight change:   Physical Exam: Gen: Comfortably sitting on the edge of her bed CVS: Pulse regular in rate and rhythm Resp: Clear to auscultation, no rales Abd: Soft, flat, nontender Ext: Right lower extremity in Ace wrap dressing. No edema.  Imaging: No results found.  Labs: BMET  Recent  Labs Lab 06/07/14 1111 06/08/14 0805 06/09/14 0555 06/10/14 1050 06/11/14 0611 06/12/14 0546 06/13/14 0440  NA 128* 132* 132* 137 134* 135 136  K 4.9 4.8 4.8 4.0 4.1 4.0 4.2  CL 101 103 101 101 97 99 102  CO2 17* 18* 19 24 23 24 25   GLUCOSE 254* 184* 168* 146* 253* 245* 307*  BUN 45* 52* 56* 45* 37* 29* 26*  CREATININE 4.05* 5.39* 5.85* 4.94* 3.99* 3.28* 2.98*  CALCIUM 8.1* 8.1* 8.0* 7.6* 7.7* 8.2* 8.1*   CBC  Recent Labs Lab 06/07/14 1111 06/08/14 0805 06/09/14 0555 06/10/14 0114  WBC 15.4* 16.0* 11.5* 11.8*  HGB 9.0* 7.4* 7.3* 7.1*  HCT 27.4* 22.9* 23.0* 22.3*  MCV 76.1* 77.6* 79.9 77.7*  PLT 549* 507* 431* 531*    Medications:    . benzonatate  100 mg Oral TID  . calcitRIOL  0.25 mcg Oral Daily  . darbepoetin (ARANESP) injection - NON-DIALYSIS  60 mcg Subcutaneous Q Fri-1800  . docusate sodium  100 mg Oral BID  . ferric gluconate (FERRLECIT/NULECIT) IV  250 mg Intravenous QODAY  . folic acid  1 mg Oral Daily  . gabapentin  300 mg Oral QHS  . heparin subcutaneous  5,000 Units Subcutaneous 3 times per day  . insulin pump   Subcutaneous TID AC, HS, 0200  . loratadine  10 mg Oral Daily  . pantoprazole  40 mg Oral Q1200  . piperacillin-tazobactam (ZOSYN)  IV  2.25 g Intravenous 3 times per day  . pravastatin  20 mg Oral Daily  . vitamin B-12  100 mcg Oral  Daily   Elmarie Shiley, MD 06/13/2014, 9:59 AM

## 2014-06-13 NOTE — Evaluation (Signed)
Physical Therapy Re-Evaluation Patient Details Name: [name] MRN: [id number] DOB: Feb 03, 1971 Today's Date: 06/13/2014   History of Present Illness  Pt is a 44 y.o. female with h/o poorly controlled DM1, osteomyelitis of left foot due to diabetic foot ulcer. Patient presents with worsening blister and cellulitis of her right foot due to a diabetic foot ulcer. X ray was negative for osteomyelitis. There have been associated fever and chills over the past week or so.She has been on bactrim for her other foot for about 2 weeks now.Other foot wound is healing she states.She saw wound care doc morning of admission who sent her in to the ED for evaluation. Pt is now s/p I&D of the R foot.  Clinical Impression  Patient presents with problems listed below.  Will benefit from acute PT to maximize independence prior to discharge. Patient very talkative/anxious about how she will manage at home.  Reports little support from family/friends.  Patient asking about kneeling walker - will try at next session.  However patient does have steps to get into house and up to bedroom.  Encouraged patient to stay on main floor - patient reluctant.  Will continue to work on plan with patient.  Also, patient to have skin graft and VAC on 06/16/14 per chart.      Follow Up Recommendations Supervision - Intermittent;No PT follow up (Will continue to assess )  If unable to function at Mod I level, may need to consider other d/c options.    Equipment Recommendations  Other (comment);Wheelchair (measurements PT) (w/c versus kneeling walker)    Recommendations for Other Services       Precautions / Restrictions Precautions Precautions: Fall Restrictions Weight Bearing Restrictions: Yes RLE Weight Bearing: Non weight bearing      Mobility  Bed Mobility Overal bed mobility: Independent                Transfers Overall transfer level: Needs assistance Equipment used: Rolling walker (2  wheeled) Transfers: Sit to/from Stand Sit to Stand: Min guard         General transfer comment: Verbal cues for hand placement.  Patient able to move to standing with min guard assist for balance/safety, maintaining NWB on RLE.  Ambulation/Gait Ambulation/Gait assistance: Min guard Ambulation Distance (Feet): 4 Feet (4' forward and 4' backward) Assistive device: Rolling walker (2 wheeled) Gait Pattern/deviations: Step-to pattern   Gait velocity interpretation: Below normal speed for age/gender General Gait Details: Verbal cues for safe use of RW.  Cues to maintain NWB on RLE.  Assist for balance/safety during gait.  Stairs            Wheelchair Mobility    Modified Rankin (Stroke Patients Only)       Balance Overall balance assessment: Needs assistance         Standing balance support: Bilateral upper extremity supported Standing balance-Leahy Scale: Poor                               Pertinent Vitals/Pain Pain Assessment: No/denies pain    Home Living Family/patient expects to be discharged to:: Private residence Living Arrangements: Non-relatives/Friends Available Help at Discharge: Family;Friend(s);Available PRN/intermittently (Sister can help with errands) Type of Home: House Home Access: Stairs to enter Entrance Stairs-Rails: None Entrance Stairs-Number of Steps: 3 Home Layout: Two level;Bed/bath upstairs Home Equipment: Crutches      Prior Function Level of Independence: Independent  Comments: Does not work, still driving, not limited with distance      Hand Dominance   Dominant Hand: Right    Extremity/Trunk Assessment   Upper Extremity Assessment: Generalized weakness           Lower Extremity Assessment: Generalized weakness (Wounds on plantar surface of both feet; Neuropathy Bil.)      Cervical / Trunk Assessment: Normal  Communication   Communication: No difficulties  Cognition Arousal/Alertness:  Awake/alert Behavior During Therapy: Anxious (Very talkative. Concerned - how will I do laundry/walk dogs) Overall Cognitive Status: Within Functional Limits for tasks assessed                      General Comments General comments (skin integrity, edema, etc.): Patient with wound on Rt foot - for skin graft and VAC on 06/16/14.  Wound on Lt foot with "homemade" dressing for pressure relief.    Exercises General Exercises - Upper Extremity Chair Push Up: AROM;Both;5 reps;Seated General Exercises - Lower Extremity Ankle Circles/Pumps: AROM;Both;5 reps;Supine      Assessment/Plan    PT Assessment Patient needs continued PT services  PT Diagnosis Difficulty walking;Generalized weakness   PT Problem List Decreased strength;Decreased activity tolerance;Decreased balance;Decreased mobility;Decreased knowledge of use of DME;Decreased skin integrity  PT Treatment Interventions DME instruction;Gait training;Stair training;Functional mobility training;Therapeutic activities;Therapeutic exercise;Patient/family education   PT Goals (Current goals can be found in the Care Plan section) Acute Rehab PT Goals Patient Stated Goal: To return home PT Goal Formulation: With patient Time For Goal Achievement: 06/20/14 Potential to Achieve Goals: Good    Frequency Min 5X/week   Barriers to discharge Decreased caregiver support;Inaccessible home environment Patient with prn assist only.  Home is 2 story with her bed/bath upstairs.    Co-evaluation               End of Session Equipment Utilized During Treatment: Gait belt Activity Tolerance: Patient limited by fatigue Patient left: in bed;with call bell/phone within reach Nurse Communication: Mobility status         Time: 1750-1826 PT Time Calculation (min) (ACUTE ONLY): 36 min   Charges:   PT Evaluation $PT Re-evaluation: 1 Procedure PT Treatments $Gait Training: 8-22 mins   PT G Codes:        Despina Pole Jun 19, 2014, 8:45 PM Carita Pian. Sanjuana Kava, [place] Pager [phone]

## 2014-06-13 NOTE — Progress Notes (Signed)
[facility] PROGRESS NOTE  [name] [id number] DOB: Aug 30, 1970 DOA: 06/02/2014 PCP: No primary care provider on file.  Assessment/Plan:    Cellulitis of right foot: MRI without osteomyelitis or deep abscess. ABIs done-- s/p irrigation and debridement of the right foot abscess  -appreciate Dr. 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- consult renal- improving   DM type 1 causing renal disease Chronic anemia: resume iron, given dose of aranesp -routine check of HIV antibody negative  HPI/Subjective: Insulin pump ran out of insulin last PM   Objective: Filed Vitals:   06/13/14 0521  BP: 146/66  Pulse: 70  Temp: 98.3 F (36.8 C)  Resp: 16    Intake/Output Summary (Last 24 hours) at 06/13/14 0925 Last data filed at 06/13/14 0554  Gross per 24 hour  Intake   1040 ml  Output   2050 ml  Net  -1010 ml   Filed Weights   06/06/14 2114 06/07/14 2100 06/08/14 2100  Weight: 61.236 kg (135 lb) 61.5 kg (135 lb 9.3 oz) 62.823 kg (138 lb 8 oz)    Exam:   General:  Poor insight into disease process  Cardiovascular: rrr without mgr  Respiratory: cta without wrr  Abdomen: s, nt, nd  Ext:  wrapped  Basic Metabolic Panel:  Recent Labs Lab 06/09/14 0555 06/10/14 1050 06/11/14 0611 06/12/14 0546 06/13/14 0440  NA 132* 137 134* 135 136  K 4.8 4.0 4.1 4.0 4.2  CL 101 101 97 99 102  CO2 19 24 23 24 25   GLUCOSE 168* 146* 253* 245* 307*  BUN 56* 45* 37* 29* 26*  CREATININE 5.85* 4.94* 3.99* 3.28* 2.98*  CALCIUM 8.0* 7.6* 7.7* 8.2* 8.1*   Liver Function Tests: No results for input(s): AST, ALT, ALKPHOS, BILITOT, PROT, ALBUMIN  in the last 168 hours. No results for input(s): LIPASE, AMYLASE in the last 168 hours. No results for input(s): AMMONIA in the last 168 hours. CBC:  Recent Labs Lab 06/06/14 1146 06/07/14 1111 06/08/14 0805 06/09/14 0555 06/10/14 0114  WBC 16.3* 15.4* 16.0* 11.5* 11.8*  HGB 7.4* 9.0* 7.4* 7.3* 7.1*  HCT 23.1* 27.4* 22.9* 23.0* 22.3*  MCV 80.2 76.1* 77.6* 79.9 77.7*  PLT 476* 549* 507* 431* 531*   Cardiac Enzymes: No results for input(s): CKTOTAL, CKMB, CKMBINDEX, TROPONINI in the last 168 hours. BNP (last 3 results) No results for input(s): BNP in the last 8760 hours.  ProBNP (last 3 results) No results for input(s): PROBNP in the last 8760 hours.  CBG:  Recent Labs Lab 06/12/14 1637 06/12/14 2023 06/12/14 2336 06/13/14 0517 06/13/14 0815  GLUCAP 184* 305* 267* 273* 332*    Recent Results (from the past 240 hour(s))  Clostridium Difficile by PCR     Status: None   Collection Time: 06/06/14 10:12 AM  Result Value Ref Range Status   C difficile by pcr NEGATIVE NEGATIVE Final  Clostridium Difficile by PCR     Status: None   Collection Time: 06/10/14 12:10 AM  Result Value Ref Range Status   C difficile by pcr NEGATIVE NEGATIVE Final  Tissue culture     Status: None (Preliminary result)   Collection Time: 06/10/14  7:56 PM  Result  Value Ref Range Status   Specimen Description TISSUE FOOT RIGHT  Final   Special Requests PATIENT ON FOLLOWING PIPERACILLEN  Final   Gram Stain   Final    FEW WBC PRESENT,BOTH PMN AND MONONUCLEAR NO SQUAMOUS EPITHELIAL CELLS SEEN NO ORGANISMS SEEN Performed at [facility]    Culture FEW YEAST Performed at [facility]   Final   Report Status PENDING  Incomplete     Studies: No results found.  Scheduled Meds: . benzonatate  100 mg Oral TID  . calcitRIOL  0.25 mcg Oral Daily  . darbepoetin (ARANESP) injection - NON-DIALYSIS  60 mcg Subcutaneous Q Fri-1800  . docusate sodium  100 mg Oral BID  . ferric  gluconate (FERRLECIT/NULECIT) IV  250 mg Intravenous QODAY  . folic acid  1 mg Oral Daily  . gabapentin  300 mg Oral QHS  . heparin subcutaneous  5,000 Units Subcutaneous 3 times per day  . insulin pump   Subcutaneous TID AC, HS, 0200  . loratadine  10 mg Oral Daily  . pantoprazole  40 mg Oral Q1200  . piperacillin-tazobactam (ZOSYN)  IV  2.25 g Intravenous 3 times per day  . pravastatin  20 mg Oral Daily  . vitamin B-12  100 mcg Oral Daily   Continuous Infusions:    Time spent: 25 minutes  [name]  [facility] [email or web address], password [facility] 06/13/2014, 9:25 AM  LOS: 11 days

## 2014-06-13 NOTE — Progress Notes (Signed)
Patient ID: [name], female   DOB: 1970-12-19, 44 y.o.   MRN: [id number] Patient seen today for right foot wound for which she received hydrotherapy yesterday. Complaining of left foot wound.  Right foot dressing clean dry and intact. Left foot small nummular wound plantar aspect of mid foot good granular tissue no signs of infection. Wet to dry dressing applied. Continue hydrotherapy to right foot , Dr. Sharol Given to take patient to OR on Wednesday for vac placement and skin graft.

## 2014-06-14 ENCOUNTER — Encounter (HOSPITAL_COMMUNITY): Admission: RE | Admit: 2014-06-14 | Payer: Medicaid Other | Source: Ambulatory Visit

## 2014-06-14 LAB — BASIC METABOLIC PANEL
Anion gap: 8 (ref 5–15)
BUN: 29 mg/dL — AB (ref 6–23)
CO2: 24 mmol/L (ref 19–32)
CREATININE: 2.49 mg/dL — AB (ref 0.50–1.10)
Calcium: 8.2 mg/dL — ABNORMAL LOW (ref 8.4–10.5)
Chloride: 104 mmol/L (ref 96–112)
GFR, EST AFRICAN AMERICAN: 26 mL/min — AB (ref >90)
GFR, EST NON AFRICAN AMERICAN: 23 mL/min — AB (ref >90)
Glucose, Bld: 180 mg/dL — ABNORMAL HIGH (ref 70–99)
POTASSIUM: 4.8 mmol/L (ref 3.5–5.1)
Sodium: 136 mmol/L (ref 135–145)

## 2014-06-14 LAB — GLUCOSE, CAPILLARY
GLUCOSE-CAPILLARY: 136 mg/dL — AB (ref 70–99)
GLUCOSE-CAPILLARY: 97 mg/dL (ref 70–99)
GLUCOSE-CAPILLARY: 52 mg/dL — AB (ref 70–99)
Glucose-Capillary: 281 mg/dL — ABNORMAL HIGH (ref 70–99)

## 2014-06-14 LAB — TISSUE CULTURE

## 2014-06-14 MED ORDER — VANCOMYCIN HCL 500 MG IV SOLR
500.0000 mg | INTRAVENOUS | Status: DC
  Administered 2014-06-15 – 2014-06-16 (×2): 500 mg via INTRAVENOUS
  Filled 2014-06-14 (×3): qty 500

## 2014-06-14 MED ORDER — VANCOMYCIN HCL 500 MG IV SOLR
500.0000 mg | Freq: Once | INTRAVENOUS | Status: AC
  Administered 2014-06-14: 500 mg via INTRAVENOUS
  Filled 2014-06-14: qty 500

## 2014-06-14 NOTE — Progress Notes (Addendum)
Physical Therapy Treatment Patient Details Name: [name] MRN: [id number] DOB: [birth year] Today's Date: 06/14/2014    History of Present Illness Pt is a 44 y.o. female with h/o poorly controlled DM1, osteomyelitis of left foot due to diabetic foot ulcer. Patient presents with worsening blister and cellulitis of her right foot due to a diabetic foot ulcer. X ray was negative for osteomyelitis. There have been associated fever and chills over the past week or so.She has been on bactrim for her other foot for about 2 weeks now.Other foot wound is healing she states.She saw wound care doc morning of admission who sent her in to the ED for evaluation. Pt is now s/p I&D of the R foot.    PT Comments    Patient able to use kneeling RW with min guard assist.  Patient unsure that it will work for her (concerned about fitting through home and about hitting foot.  Patient requests use of rollator.  Explained to her not safe when NWB on one LE. Patient to have surgery today.  Will resume conversation and decisions regarding equipment post-op.  Provided theraband for UE strengthening. MD:  Will need new PT consult orders post-op including weight bearing status.  Noted patient drinking "herbal tea".  Patient is NPO.  States "I forgot".  RN notified.   Follow Up Recommendations  Supervision - Intermittent;No PT follow up  (Patient will need to function at Mod I level to return home safely.  If unable to achieve, may need to consider other d/c plan)     Equipment Recommendations  Other (comment) (Pt reports w/c will not fit in home.  Poss kneeling walker)    Recommendations for Other Services       Precautions / Restrictions Precautions Precautions: Fall Restrictions Weight Bearing Restrictions: Yes RLE Weight Bearing: Non weight bearing    Mobility  Bed Mobility Overal bed mobility: Independent                Transfers Overall transfer level: Needs assistance Equipment  used:  (Kneeling walker) Transfers: Sit to/from Stand Sit to Stand: Min guard         General transfer comment: Verbal cues for placement of kneeling walker and technique for standing and return to sitting.  Instructed patient on how to lock wheels.  Assist for safety/balance.  Ambulation/Gait Ambulation/Gait assistance: Min guard Ambulation Distance (Feet): 84 Feet Assistive device:  (Kneeling walker) Gait Pattern/deviations: Step-to pattern     General Gait Details: Verbal cues for safe use of kneeling walker and use of brakes.  Patient able to use safely.   Stairs            Wheelchair Mobility    Modified Rankin (Stroke Patients Only)       Balance                                    Cognition Arousal/Alertness: Awake/alert Behavior During Therapy: Anxious Overall Cognitive Status: Within Functional Limits for tasks assessed                      Exercises General Exercises - Upper Extremity Shoulder Flexion: AROM;Strengthening;Both;10 reps;Supine;Theraband (Presses) Theraband Level (Shoulder Flexion): Level 3 (Green) Shoulder Horizontal ADduction: AROM;Strengthening;Both;10 reps;Supine;Theraband Theraband Level (Shoulder Horizontal Adduction): Level 3 (Green) Elbow Extension: AROM;Strengthening;Both;10 reps;Supine;Theraband Theraband Level (Elbow Extension): Level 3 (Green) General Exercises - Lower Extremity Ankle Circles/Pumps: AROM;Both;10 reps;Supine  General Comments        Pertinent Vitals/Pain Pain Assessment: No/denies pain    Home Living                      Prior Function            PT Goals (current goals can now be found in the care plan section) Progress towards PT goals: Progressing toward goals    Frequency  Min 5X/week    PT Plan Current plan remains appropriate;Equipment recommendations need to be updated    Co-evaluation             End of Session Equipment Utilized During  Treatment: Gait belt Activity Tolerance: Patient limited by fatigue Patient left: in bed;with call bell/phone within reach;with family/visitor present     Time: [id number] PT Time Calculation (min) (ACUTE ONLY): 39 min  Charges:  $Gait Training: 23-37 mins $Therapeutic Exercise: 8-22 mins                    G Codes:      [name] 06-21-2014, 11:52 AM [name]. Sanjuana Kava, [place] Pager [phone]

## 2014-06-14 NOTE — Interval H&P Note (Signed)
History and Physical Interval Note:  06/14/2014 6:28 AM  [name]  has presented today for surgery, with the diagnosis of right foot wound  The various methods of treatment have been discussed with the patient and family. After consideration of risks, benefits and other options for treatment, the patient has consented to  Procedure(s): SPLIT THICKNESS SKIN GRAFT RIGHT FOOT (Right) as a surgical intervention .  The patient's history has been reviewed, patient examined, no change in status, stable for surgery.  I have reviewed the patient's chart and labs.  Questions were answered to the patient's satisfaction.     DUDA,MARCUS V

## 2014-06-14 NOTE — Progress Notes (Signed)
ANTIBIOTIC CONSULT NOTE - FOLLOW UP  Pharmacy Consult for Vancomycin + Zosyn Indication: Right diabetic foot infection/cellulitis  Allergies  Allergen Reactions  . Ciprofloxacin Itching  . Cleocin [Clindamycin Hcl]     Diarrhea     Patient Measurements: Height: 5\' 7"  (170.2 cm) Weight: 138 lb 8 oz (62.823 kg) IBW/kg (Calculated) : 61.6  Vital Signs: Temp: 98.4 F (36.9 C) (03/14 0543) BP: 128/58 mmHg (03/14 0543) Pulse Rate: 71 (03/14 0543) Intake/Output from previous day: 03/13 0701 - 03/14 0700 In: 1420 [P.O.:1320; IV Piggyback:100] Out: 2800 [Urine:2800] Intake/Output from this shift:    Labs:  Recent Labs  06/12/14 0546 06/13/14 0440 06/14/14 0607  CREATININE 3.28* 2.98* 2.49*   Estimated Creatinine Clearance: 28.6 mL/min (by C-G formula based on Cr of 2.49).  Recent Labs  06/12/14 0749 06/13/14 0952  VANCORANDOM 16.2 21.4     Microbiology: Recent Results (from the past 720 hour(s))  Blood culture (routine x 2)     Status: None   Collection Time: 06/02/14  6:35 PM  Result Value Ref Range Status   Specimen Description BLOOD LEFT FA  Final   Special Requests BOTTLES DRAWN AEROBIC AND ANAEROBIC 5CC EACH  Final   Culture   Final    NO GROWTH 5 DAYS Performed at [facility]    Report Status 06/09/2014 FINAL  Final  Blood culture (routine x 2)     Status: None   Collection Time: 06/02/14  7:00 PM  Result Value Ref Range Status   Specimen Description BLOOD RIGHT AC  Final   Special Requests BOTTLES DRAWN AEROBIC AND ANAEROBIC 5 CC EACH  Final   Culture   Final    NO GROWTH 5 DAYS Performed at [facility]    Report Status 06/09/2014 FINAL  Final  MRSA PCR Screening     Status: None   Collection Time: 06/03/14 12:12 AM  Result Value Ref Range Status   MRSA by PCR NEGATIVE NEGATIVE Final    Comment:        The GeneXpert MRSA Assay (FDA approved for NASAL specimens only), is one component of a comprehensive MRSA  colonization surveillance program. It is not intended to diagnose MRSA infection nor to guide or monitor treatment for MRSA infections.   Clostridium Difficile by PCR     Status: None   Collection Time: 06/06/14 10:12 AM  Result Value Ref Range Status   C difficile by pcr NEGATIVE NEGATIVE Final  Clostridium Difficile by PCR     Status: None   Collection Time: 06/10/14 12:10 AM  Result Value Ref Range Status   C difficile by pcr NEGATIVE NEGATIVE Final  Tissue culture     Status: None (Preliminary result)   Collection Time: 06/10/14  7:56 PM  Result Value Ref Range Status   Specimen Description TISSUE FOOT RIGHT  Final   Special Requests PATIENT ON FOLLOWING PIPERACILLEN  Final   Gram Stain   Final    FEW WBC PRESENT,BOTH PMN AND MONONUCLEAR NO SQUAMOUS EPITHELIAL CELLS SEEN NO ORGANISMS SEEN Performed at [facility]    Culture FEW YEAST Performed at [facility]   Final   Report Status PENDING  Incomplete    Anti-infectives    Start     Dose/Rate Route Frequency Ordered Stop   06/13/14 1400  piperacillin-tazobactam (ZOSYN) IVPB 3.375 g     3.375 g 12.5 mL/hr over 240 Minutes Intravenous 3 times per day 06/13/14 1216     06/12/14  2000  vancomycin (VANCOCIN) IVPB 750 mg/150 ml premix     750 mg 150 mL/hr over 60 Minutes Intravenous  Once 06/12/14 0931 06/13/14 0111   06/11/14 0800  vancomycin (VANCOCIN) IVPB 750 mg/150 ml premix  Status:  Discontinued     750 mg 150 mL/hr over 60 Minutes Intravenous every 72 hours 06/11/14 0725 06/11/14 1637   06/09/14 0100  vancomycin (VANCOCIN) 500 mg in sodium chloride 0.9 % 100 mL IVPB  Status:  Discontinued     500 mg 100 mL/hr over 60 Minutes Intravenous Every 36 hours 06/07/14 1533 06/08/14 1151   06/07/14 2200  piperacillin-tazobactam (ZOSYN) IVPB 2.25 g  Status:  Discontinued     2.25 g 100 mL/hr over 30 Minutes Intravenous 3 times per day 06/07/14 1411 06/13/14 1216   06/04/14 1400  piperacillin-tazobactam  (ZOSYN) IVPB 3.375 g  Status:  Discontinued     3.375 g 12.5 mL/hr over 240 Minutes Intravenous 3 times per day 06/04/14 1250 06/07/14 1410   06/04/14 1400  vancomycin (VANCOCIN) IVPB 750 mg/150 ml premix  Status:  Discontinued     750 mg 150 mL/hr over 60 Minutes Intravenous Every 24 hours 06/04/14 1329 06/07/14 1533   06/03/14 2200  vancomycin (VANCOCIN) 500 mg in sodium chloride 0.9 % 100 mL IVPB  Status:  Discontinued     500 mg 100 mL/hr over 60 Minutes Intravenous Every 24 hours 06/03/14 0005 06/03/14 1116   06/03/14 1200  cefTRIAXone (ROCEPHIN) 1 g in dextrose 5 % 50 mL IVPB - Premix  Status:  Discontinued     1 g 100 mL/hr over 30 Minutes Intravenous Every 24 hours 06/03/14 1116 06/04/14 1250   06/03/14 1200  metroNIDAZOLE (FLAGYL) IVPB 500 mg  Status:  Discontinued     500 mg 100 mL/hr over 60 Minutes Intravenous Every 8 hours 06/03/14 1116 06/04/14 1250   06/03/14 1000  valACYclovir (VALTREX) tablet 500 mg  Status:  Discontinued     500 mg Oral Daily 06/02/14 2333 06/08/14 1516   06/02/14 1830  vancomycin (VANCOCIN) IVPB 1000 mg/200 mL premix     1,000 mg 200 mL/hr over 60 Minutes Intravenous  Once 06/02/14 1827 06/02/14 2150      Assessment: 89 YOF who continues on Vancomycin + Zosyn for a R-diabetic foot ulcer/cellulitis. Imaging has ruled out osteo this admission. Ortho is on board and the patient is s/p I&D on 3/10 with plans to place a skin graft today (3/14). The patient was noted to have an acute kidney injury earlier this admit - now resolving and renal has signed off. SCr 2.49 << 2.98, CrCl~25-30 ml/min, UOP 1.9 ml/kg/hr.  The patient's last Vancomycin level was 21.4 mcg/ml on 3/13 AM however this is after a dose was given earlier that morning of 750 mg at 0011. The patient appears to have regained filtering capabilities and should be clearing Vancomycin normally - will resume scheduled Vancomycin doses today.   Goal of Therapy:  Vancomycin trough level 10-15  mcg/ml  Plan:  1. Vancomcyin 500 mg IV every 24 hours 2. Continue Zosyn 3.375g IV every 8 hours 3. Will continue to follow renal function, culture results, LOT, and antibiotic de-escalation plans   Alycia Rossetti, PharmD, BCPS Clinical Pharmacist Pager: [phone] 06/14/2014 2:36 PM

## 2014-06-14 NOTE — Progress Notes (Signed)
Surgery canceled due to unavailability of the operating room this evening due to multiple emergency surgeries. We will plan to add-on surgery for tomorrow evening.

## 2014-06-14 NOTE — Progress Notes (Signed)
Inpatient Diabetes Program Recommendations  AACE/ADA: New Consensus Statement on Inpatient Glycemic Control (2013)  Target Ranges:  Prepandial:   less than 140 mg/dL      Peak postprandial:   less than 180 mg/dL (1-2 hours)      Critically ill patients:  140 - 180 mg/dL     Asked RN to please ask patient today when she last changed her set/site for her insulin pump and to document on the Insulin Pump management tab of the Flowsheets section of [facility].  Note patient to go to OR today for skin graft.  If patient removes insulin pump for procedure, please make sure patient resumes her insulin pump immediately following the procedure.     Will follow Wyn Quaker RN, MSN, CDE Diabetes Coordinator Inpatient Diabetes Program Team Pager: [phone] (8a-5p)

## 2014-06-14 NOTE — H&P (View-Only) (Signed)
Patient ID: [name], female   DOB: 12/05/1970, 44 y.o.   MRN: [id number] Start hydrotherapy today for right foot wound.  Plan for skin graft and wound VAC Wednesday.  Necrotic tissue sent for tissue cultures.

## 2014-06-14 NOTE — Progress Notes (Signed)
[facility] PROGRESS NOTE  [name] [id number] DOB: 12-26-1970 DOA: 06/02/2014 PCP: No primary care provider on file.  Assessment/Plan:    Cellulitis of right foot: s/p I and D. For skin graft today. Continue antibiotics perioperatively.      Type 1 diabetes mellitus with neurological manifestations, uncontrolled: continue insulin pump. 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 D/c foley tomorrow   DM type 1 causing renal disease Chronic anemia: resume iron, given dose of aranesp -routine check of HIV antibody negative  HPI/Subjective: Hungry. Anxious for surgery  Objective: Filed Vitals:   06/14/14 0952  BP: 146/71  Pulse: 71  Temp: 98.4 F (36.9 C)  Resp: 17    Intake/Output Summary (Last 24 hours) at 06/14/14 1627 Last data filed at 06/14/14 0951  Gross per 24 hour  Intake    700 ml  Output   2100 ml  Net  -1400 ml   Filed Weights   06/06/14 2114 06/07/14 2100 06/08/14 2100  Weight: 61.236 kg (135 lb) 61.5 kg (135 lb 9.3 oz) 62.823 kg (138 lb 8 oz)    Exam:   General:  Poor insight into disease process  Cardiovascular: rrr without mgr  Respiratory: cta without wrr  Abdomen: s, nt, nd  Ext:  wrapped  Basic Metabolic Panel:  Recent Labs Lab 06/10/14 1050 06/11/14 0611 06/12/14 0546 06/13/14 0440 06/14/14 0607  NA 137 134* 135 136 136  K 4.0 4.1 4.0 4.2 4.8  CL 101 97 99 102 104  CO2 24 23 24 25 24   GLUCOSE 146* 253* 245* 307* 180*  BUN 45* 37* 29* 26* 29*  CREATININE 4.94* 3.99* 3.28* 2.98* 2.49*  CALCIUM 7.6* 7.7* 8.2* 8.1* 8.2*   Liver Function Tests: No results for input(s): AST, ALT, ALKPHOS, BILITOT, PROT, ALBUMIN in the last 168 hours. No results for input(s): LIPASE, AMYLASE in the last 168 hours. No results for input(s): AMMONIA in the last 168 hours. CBC:  Recent Labs Lab 06/08/14 0805 06/09/14 0555 06/10/14 0114  WBC 16.0* 11.5* 11.8*  HGB 7.4* 7.3* 7.1*  HCT 22.9*  23.0* 22.3*  MCV 77.6* 79.9 77.7*  PLT 507* 431* 531*   Cardiac Enzymes: No results for input(s): CKTOTAL, CKMB, CKMBINDEX, TROPONINI in the last 168 hours. BNP (last 3 results) No results for input(s): BNP in the last 8760 hours.  ProBNP (last 3 results) No results for input(s): PROBNP in the last 8760 hours.  CBG:  Recent Labs Lab 06/13/14 1141 06/13/14 1626 06/13/14 2003 06/14/14 0324 06/14/14 1206  GLUCAP 321* 210* 86 136* 281*    Recent Results (from the past 240 hour(s))  Clostridium Difficile by PCR     Status: None   Collection Time: 06/06/14 10:12 AM  Result Value Ref Range Status   C difficile by pcr NEGATIVE NEGATIVE Final  Clostridium Difficile by PCR     Status: None   Collection Time: 06/10/14 12:10 AM  Result Value Ref Range Status   C difficile by pcr NEGATIVE NEGATIVE Final  Tissue culture     Status: None   Collection Time: 06/10/14  7:56 PM  Result Value Ref Range Status   Specimen Description TISSUE FOOT RIGHT  Final   Special Requests PATIENT ON FOLLOWING PIPERACILLEN  Final   Gram Stain   Final    FEW WBC PRESENT,BOTH PMN AND MONONUCLEAR NO SQUAMOUS EPITHELIAL CELLS SEEN NO ORGANISMS SEEN Performed at [facility]   Final  FEW CANDIDA ALBICANS Performed at [facility]    Report Status 06/14/2014 FINAL  Final     Studies: No results found.  Scheduled Meds: . benzonatate  100 mg Oral TID  . calcitRIOL  0.25 mcg Oral Daily  . darbepoetin (ARANESP) injection - NON-DIALYSIS  60 mcg Subcutaneous Q Fri-1800  . docusate sodium  100 mg Oral BID  . ferric gluconate (FERRLECIT/NULECIT) IV  250 mg Intravenous QODAY  . folic acid  1 mg Oral Daily  . gabapentin  300 mg Oral QHS  . heparin subcutaneous  5,000 Units Subcutaneous 3 times per day  . insulin pump   Subcutaneous TID AC, HS, 0200  . loratadine  10 mg Oral Daily  . pantoprazole  40 mg Oral Q1200  . piperacillin-tazobactam (ZOSYN)  IV  3.375 g Intravenous  3 times per day  . pravastatin  20 mg Oral Daily  . [START ON 06/15/2014] vancomycin  500 mg Intravenous Q24H  . vitamin B-12  100 mcg Oral Daily   Continuous Infusions:    Time spent: 25 minutes  [facility] [email or web address], password [facility] 06/14/2014, 4:27 PM  LOS: 12 days

## 2014-06-14 NOTE — Progress Notes (Signed)
Physical Therapy Wound Treatment Patient Details  Name: [name] MRN: [id number] Date of Birth: 1971-01-02  Today's Date: 06/14/2014 Time: 1005-1030 Time Calculation (min): 25 min  Subjective  Subjective: I need my pain meds Patient and Family Stated Goals: to get this healed up Prior Treatments: s/p I&D 3/10  Pain Score:  Pain with debridement.  Wound Assessment  Wound 12/17/11 Foot Left;Medial quarter sized wound with yellow and red discoloration, dry, no discharge (Active)  Dressing Type Gauze (Comment) 06/13/2014  9:25 PM  Dressing Changed Changed 06/13/2014  4:21 PM  Dressing Status Clean;Dry;Intact 06/13/2014  9:25 PM  Dressing Change Frequency Daily 06/13/2014  4:21 PM  Site / Wound Assessment Red 06/13/2014  4:21 PM  % Wound base Red or Granulating 100% 06/13/2014  4:21 PM  Peri-wound Assessment Intact 06/07/2014  5:00 PM  Wound Length (cm) 1 cm 06/07/2014  5:00 PM  Wound Width (cm) 2 cm 06/07/2014  5:00 PM  Wound Depth (cm) 0.1 cm 06/07/2014  5:00 PM  Margins Unattached edges (unapproximated) 06/08/2014  8:00 AM  Closure None 06/08/2014  8:00 AM  Drainage Amount Scant 06/13/2014  4:21 PM  Drainage Description Serosanguineous 06/13/2014  4:21 PM     Wound 02/02/12 Other (Comment) Foot Left;Medial  diabetic ulcer, wound bed with red,non granulation tissues with pale/gray  discharges., quarter size (Active)     Wound 02/08/13 Blister (Serous filled) Toe (Comment  which one) Anterior (Active)     Wound 03/25/13 Diabetic ulcer Toe (Comment  which one) Left small ulcers to outside of left 4/5th toes.  (Active)     Wound / Incision (Open or Dehisced) 06/12/13 Other (Comment) Toe (Comment  which one) Left left toes red and swollen -- infection (Active)     Wound / Incision (Open or Dehisced) 06/02/14 Diabetic ulcer Foot Right (Active)  Dressing Type ABD;Compression wrap;Gauze (Comment) 06/14/2014 10:39 AM  Dressing Changed Changed 06/12/2014  8:38 AM  Dressing Status Clean;Dry;Intact  06/14/2014 10:39 AM  Dressing Change Frequency Daily 06/14/2014 10:39 AM  Site / Wound Assessment Granulation tissue;Pink;Red;Yellow 06/14/2014 10:39 AM  % Wound base Red or Granulating 70% 06/14/2014 10:39 AM  % Wound base Yellow 25% 06/14/2014 10:39 AM  % Wound base Black 5% 06/14/2014 10:39 AM  % Wound base Other (Comment) 10% 06/02/2014 11:12 PM  Peri-wound Assessment Maceration;Pink;Erythema (blanchable) 06/14/2014 10:39 AM  Wound Length (cm) 9.8 cm 06/11/2014 12:00 PM  Wound Width (cm) 3 cm 06/11/2014 12:00 PM  Wound Depth (cm) 2.5 cm 06/11/2014 12:00 PM  Margins Unattached edges (unapproximated) 06/14/2014 10:39 AM  Closure None 06/14/2014 10:39 AM  Drainage Amount Minimal 06/14/2014 10:39 AM  Drainage Description No odor;Serosanguineous 06/14/2014 10:39 AM  Non-staged Wound Description Full thickness 06/14/2014 10:39 AM  Treatment Cleansed;Debridement (Selective);Hydrotherapy (Pulse lavage);Packing (Saline gauze) 06/14/2014 10:39 AM     Wound / Incision (Open or Dehisced) 06/02/14 Other (Comment) Foot Right Black blister, not open surrounding red (Active)  Dressing Type Gauze (Comment) 06/10/2014  1:45 PM  Dressing Changed Changed 06/07/2014  5:00 PM  Dressing Status Clean;Dry;Intact 06/10/2014  1:45 PM  Site / Wound Assessment Dressing in place / Unable to assess 06/10/2014  1:45 PM  % Wound base Black 100% 06/02/2014 11:12 PM  Peri-wound Assessment Erythema (non-blanchable) 06/02/2014 11:12 PM  Wound Length (cm) 1 cm 06/02/2014 11:12 PM  Wound Width (cm) 2 cm 06/02/2014 11:12 PM     Incision (Closed) 06/10/14 Foot Right (Active)  Dressing Type Compression wrap 06/13/2014  9:25 PM  Dressing Clean;Dry  06/13/2014  9:25 PM  Site / Wound Assessment Dressing in place / Unable to assess 06/13/2014  8:00 AM  Drainage Amount None 06/13/2014  8:00 AM  Treatment Other (Comment) 06/10/2014  8:15 PM   Hydrotherapy Pulsed lavage therapy - wound location: plantar surface right foot Pulsed Lavage with Suction (psi): 4  psi (to 8) Pulsed Lavage with Suction - Normal Saline Used: 1000 mL Pulsed Lavage Tip: Tip with splash shield Selective Debridement Selective Debridement - Location: plantar surface right foot Selective Debridement - Tools Used: Scissors;Forceps Selective Debridement - Tissue Removed: fibrin, slough   Wound Assessment and Plan  Wound Therapy - Assess/Plan/Recommendations Wound Therapy - Clinical Statement: Wound edges remain necrotic/dusky. Pt to go for skin graft and wound VAC application on 1/66. Will await new orders if needed. Instructed pt on AROM knee, ankle and toes and to maintain NWB on RLE to allow for healing.  Wound Therapy - Functional Problem List: DM; decreased mobility secondary to NWB RLE Factors Delaying/Impairing Wound Healing: Diabetes Mellitus;Altered sensation Hydrotherapy Plan: Debridement;Dressing change;Patient/family education;Pulsatile lavage with suction Wound Therapy - Frequency: 6X / week Wound Therapy - Current Recommendations: WOC nurse;Surgery consult;PT Wound Therapy - Follow Up Recommendations: Wound Care Center;Home health RN Wound Plan: see above  Wound Therapy Goals- Improve the function of patient's integumentary system by progressing the wound(s) through the phases of wound healing (inflammation - proliferation - remodeling) by: Decrease Necrotic Tissue to: <10% Decrease Necrotic Tissue - Progress: Progressing toward goal Increase Granulation Tissue to: >90% Increase Granulation Tissue - Progress: Progressing toward goal Improve Drainage Characteristics: Min Improve Drainage Characteristics - Progress: Progressing toward goal Goals/treatment plan/discharge plan were made with and agreed upon by patient/family: Yes Time For Goal Achievement: 6 days Wound Therapy - Potential for Goals: Good  Goals will be updated until maximal potential achieved or discharge criteria met.  Discharge criteria: when goals achieved, discharge from hospital, MD  decision/surgical intervention, no progress towards goals, refusal/missing three consecutive treatments without notification or medical reason.  GP     Candy Sledge A 06/14/2014, 10:43 AM Candy Sledge, PT, DPT [phone]

## 2014-06-14 NOTE — Progress Notes (Signed)
Patient ID: [name], female   DOB: 1970-11-30, 44 y.o.   MRN: [id number] Plan for STSG today at [place], NPO, may D/C to home on Wednesday from ortho standpoint

## 2014-06-15 ENCOUNTER — Encounter (HOSPITAL_COMMUNITY)
Admission: EM | Disposition: A | Discharge status: Home Health | Payer: Self-pay | Source: Home / Self Care | Attending: Internal Medicine

## 2014-06-15 ENCOUNTER — Inpatient Hospital Stay (HOSPITAL_COMMUNITY): Payer: Medicaid Other | Admitting: Anesthesiology

## 2014-06-15 HISTORY — PX: SKIN GRAFT: SHX250

## 2014-06-15 HISTORY — PX: SKIN SPLIT GRAFT: SHX444

## 2014-06-15 LAB — BASIC METABOLIC PANEL
Anion gap: 10 (ref 5–15)
BUN: 24 mg/dL — ABNORMAL HIGH (ref 6–23)
CHLORIDE: 105 mmol/L (ref 96–112)
CO2: 23 mmol/L (ref 19–32)
CREATININE: 2.27 mg/dL — AB (ref 0.50–1.10)
Calcium: 8.3 mg/dL — ABNORMAL LOW (ref 8.4–10.5)
GFR calc non Af Amer: 25 mL/min — ABNORMAL LOW (ref >90)
GFR, EST AFRICAN AMERICAN: 29 mL/min — AB (ref >90)
Glucose, Bld: 264 mg/dL — ABNORMAL HIGH (ref 70–99)
Potassium: 4.7 mmol/L (ref 3.5–5.1)
Sodium: 138 mmol/L (ref 135–145)

## 2014-06-15 LAB — GLUCOSE, CAPILLARY
GLUCOSE-CAPILLARY: 230 mg/dL — AB (ref 70–99)
Glucose-Capillary: 156 mg/dL — ABNORMAL HIGH (ref 70–99)
Glucose-Capillary: 256 mg/dL — ABNORMAL HIGH (ref 70–99)
Glucose-Capillary: 312 mg/dL — ABNORMAL HIGH (ref 70–99)
Glucose-Capillary: 105 mg/dL — ABNORMAL HIGH (ref 70–99)

## 2014-06-15 LAB — CBC
HEMATOCRIT: 24.3 % — AB (ref 36.0–46.0)
Hemoglobin: 7.3 g/dL — ABNORMAL LOW (ref 12.0–15.0)
MCH: 24.7 pg — AB (ref 26.0–34.0)
MCHC: 30 g/dL (ref 30.0–36.0)
MCV: 82.1 fL (ref 78.0–100.0)
PLATELETS: 826 10*3/uL — AB (ref 150–400)
RBC: 2.96 MIL/uL — ABNORMAL LOW (ref 3.87–5.11)
RDW: 16.6 % — AB (ref 11.5–15.5)
WBC: 11.2 10*3/uL — ABNORMAL HIGH (ref 4.0–10.5)

## 2014-06-15 SURGERY — APPLICATION, GRAFT, SKIN, SPLIT-THICKNESS
Anesthesia: General | Site: Foot | Laterality: Right

## 2014-06-15 MED ORDER — PROPOFOL 10 MG/ML IV BOLUS
INTRAVENOUS | Status: AC
  Filled 2014-06-15: qty 20

## 2014-06-15 MED ORDER — METHOCARBAMOL 1000 MG/10ML IJ SOLN
500.0000 mg | Freq: Four times a day (QID) | INTRAVENOUS | Status: DC | PRN
  Filled 2014-06-15: qty 5

## 2014-06-15 MED ORDER — LIDOCAINE HCL (CARDIAC) 20 MG/ML IV SOLN
INTRAVENOUS | Status: AC
  Filled 2014-06-15: qty 5

## 2014-06-15 MED ORDER — OXYCODONE HCL 5 MG PO TABS: 5.0000 mg | ORAL_TABLET | Freq: Once | ORAL | Status: AC | PRN

## 2014-06-15 MED ORDER — SODIUM CHLORIDE 0.9 % IV SOLN
INTRAVENOUS | Status: DC
  Administered 2014-06-16: via INTRAVENOUS

## 2014-06-15 MED ORDER — SUCCINYLCHOLINE CHLORIDE 20 MG/ML IJ SOLN
INTRAMUSCULAR | Status: AC
  Filled 2014-06-15: qty 1

## 2014-06-15 MED ORDER — OXYCODONE HCL 5 MG/5ML PO SOLN: 5.0000 mg | Freq: Once | ORAL | Status: DC | PRN

## 2014-06-15 MED ORDER — SODIUM CHLORIDE 0.9 % IJ SOLN
INTRAMUSCULAR | Status: AC
  Filled 2014-06-15: qty 10

## 2014-06-15 MED ORDER — MIDAZOLAM HCL 2 MG/2ML IJ SOLN
INTRAMUSCULAR | Status: AC
  Filled 2014-06-15: qty 2

## 2014-06-15 MED ORDER — METHOCARBAMOL 500 MG PO TABS: 500.0000 mg | ORAL_TABLET | Freq: Four times a day (QID) | ORAL | Status: DC | PRN

## 2014-06-15 MED ORDER — ONDANSETRON HCL 4 MG/2ML IJ SOLN: 4.0000 mg | Freq: Four times a day (QID) | INTRAMUSCULAR | Status: DC | PRN

## 2014-06-15 MED ORDER — CHLORHEXIDINE GLUCONATE 4 % EX LIQD: 60.0000 mL | Freq: Once | CUTANEOUS | Status: DC

## 2014-06-15 MED ORDER — DOCUSATE SODIUM 100 MG PO CAPS
100.0000 mg | ORAL_CAPSULE | Freq: Two times a day (BID) | ORAL | Status: DC
Start: 2014-06-16 — End: 2014-06-16
  Filled 2014-06-15 (×2): qty 1

## 2014-06-15 MED ORDER — MINERAL OIL LIGHT 100 % EX OIL
TOPICAL_OIL | CUTANEOUS | Status: AC
  Filled 2014-06-15: qty 25

## 2014-06-15 MED ORDER — 0.9 % SODIUM CHLORIDE (POUR BTL) OPTIME
TOPICAL | Status: DC | PRN
  Administered 2014-06-15: 1000 mL

## 2014-06-15 MED ORDER — METOCLOPRAMIDE HCL 5 MG PO TABS: 5.0000 mg | ORAL_TABLET | Freq: Three times a day (TID) | ORAL | Status: DC | PRN

## 2014-06-15 MED ORDER — LIDOCAINE HCL (CARDIAC) 20 MG/ML IV SOLN
INTRAVENOUS | Status: DC | PRN
  Administered 2014-06-15: 100 mg via INTRAVENOUS

## 2014-06-15 MED ORDER — HYDROMORPHONE HCL 1 MG/ML IJ SOLN: 0.2500 mg | INTRAMUSCULAR | Status: DC | PRN

## 2014-06-15 MED ORDER — FENTANYL CITRATE 0.05 MG/ML IJ SOLN
INTRAMUSCULAR | Status: AC
  Filled 2014-06-15: qty 5

## 2014-06-15 MED ORDER — FENTANYL CITRATE 0.05 MG/ML IJ SOLN
INTRAMUSCULAR | Status: DC | PRN
  Administered 2014-06-15 (×2): 50 ug via INTRAVENOUS

## 2014-06-15 MED ORDER — HYDROMORPHONE HCL 1 MG/ML IJ SOLN
INTRAMUSCULAR | Status: AC
  Filled 2014-06-15: qty 1

## 2014-06-15 MED ORDER — PROMETHAZINE HCL 25 MG/ML IJ SOLN: 6.2500 mg | INTRAMUSCULAR | Status: DC | PRN

## 2014-06-15 MED ORDER — METOCLOPRAMIDE HCL 5 MG/ML IJ SOLN: 5.0000 mg | Freq: Three times a day (TID) | INTRAMUSCULAR | Status: DC | PRN

## 2014-06-15 MED ORDER — SODIUM CHLORIDE 0.9 % IV SOLN
INTRAVENOUS | Status: DC | PRN
  Administered 2014-06-15: 19:00:00 via INTRAVENOUS

## 2014-06-15 MED ORDER — PROPOFOL 10 MG/ML IV BOLUS
INTRAVENOUS | Status: DC | PRN
  Administered 2014-06-15: 200 mg via INTRAVENOUS

## 2014-06-15 MED ORDER — OXYCODONE-ACETAMINOPHEN 5-325 MG PO TABS: 1.0000 | ORAL_TABLET | ORAL | Status: DC | PRN

## 2014-06-15 MED ORDER — HYDROMORPHONE HCL 1 MG/ML IJ SOLN
0.5000 mg | INTRAMUSCULAR | Status: DC | PRN
  Administered 2014-06-16 (×2): 1 mg via INTRAVENOUS
  Filled 2014-06-15 (×2): qty 1

## 2014-06-15 MED ORDER — ONDANSETRON HCL 4 MG PO TABS: 4.0000 mg | ORAL_TABLET | Freq: Four times a day (QID) | ORAL | Status: DC | PRN

## 2014-06-15 MED ORDER — HYDROMORPHONE HCL 1 MG/ML IJ SOLN
0.2500 mg | INTRAMUSCULAR | Status: DC | PRN
  Administered 2014-06-15 (×2): 0.5 mg via INTRAVENOUS

## 2014-06-15 MED ORDER — MIDAZOLAM HCL 2 MG/2ML IJ SOLN
INTRAMUSCULAR | Status: DC | PRN
  Administered 2014-06-15: 2 mg via INTRAVENOUS

## 2014-06-15 MED ORDER — EPHEDRINE SULFATE 50 MG/ML IJ SOLN
INTRAMUSCULAR | Status: AC
  Filled 2014-06-15: qty 1

## 2014-06-15 MED ORDER — MINERAL OIL LIGHT 100 % EX OIL
TOPICAL_OIL | CUTANEOUS | Status: DC | PRN
  Administered 2014-06-15: 1 via TOPICAL

## 2014-06-15 MED ORDER — PROMETHAZINE HCL 25 MG/ML IJ SOLN
6.2500 mg | INTRAMUSCULAR | Status: DC | PRN
Start: 2014-06-15 — End: 2014-06-15

## 2014-06-15 MED ORDER — MUPIROCIN CALCIUM 2 % EX CREA
TOPICAL_CREAM | CUTANEOUS | Status: AC
  Filled 2014-06-15: qty 15

## 2014-06-15 MED ORDER — OXYCODONE HCL 5 MG PO TABS: 5.0000 mg | ORAL_TABLET | Freq: Once | ORAL | Status: DC | PRN

## 2014-06-15 MED ORDER — OXYCODONE HCL 5 MG/5ML PO SOLN: 5.0000 mg | Freq: Once | ORAL | Status: AC | PRN

## 2014-06-15 SURGICAL SUPPLY — 44 items
BLADE DERMATOME II (BLADE) ×1 IMPLANT
BNDG CMPR 9X4 STRL LF SNTH (GAUZE/BANDAGES/DRESSINGS) ×1
BNDG COHESIVE 6X5 TAN STRL LF (GAUZE/BANDAGES/DRESSINGS) IMPLANT
BNDG ESMARK 4X9 LF (GAUZE/BANDAGES/DRESSINGS) ×2 IMPLANT
BNDG GAUZE STRTCH 6 (GAUZE/BANDAGES/DRESSINGS) IMPLANT
COVER SURGICAL LIGHT HANDLE (MISCELLANEOUS) ×2 IMPLANT
CUFF TOURNIQUET SINGLE 18IN (TOURNIQUET CUFF) IMPLANT
CUFF TOURNIQUET SINGLE 24IN (TOURNIQUET CUFF) IMPLANT
DEPRESSOR TONGUE BLADE STERILE (MISCELLANEOUS) ×1 IMPLANT
DERMACARRIERS GRAFT 1 TO 1.5 (DISPOSABLE) ×2
DRAPE U-SHAPE 47X51 STRL (DRAPES) ×2 IMPLANT
DRSG ADAPTIC 3X8 NADH LF (GAUZE/BANDAGES/DRESSINGS) IMPLANT
DRSG MEPITEL 4X7.2 (GAUZE/BANDAGES/DRESSINGS) ×2 IMPLANT
DRSG VAC ATS SM SENSATRAC (GAUZE/BANDAGES/DRESSINGS) ×1 IMPLANT
DURAPREP 26ML APPLICATOR (WOUND CARE) ×2 IMPLANT
ELECT REM PT RETURN 9FT ADLT (ELECTROSURGICAL) ×2
ELECTRODE REM PT RTRN 9FT ADLT (ELECTROSURGICAL) ×1 IMPLANT
GAUZE SPONGE 4X4 12PLY STRL (GAUZE/BANDAGES/DRESSINGS) IMPLANT
GLOVE BIOGEL PI IND STRL 9 (GLOVE) ×1 IMPLANT
GLOVE BIOGEL PI INDICATOR 9 (GLOVE) ×1
GLOVE SURG ORTHO 9.0 STRL STRW (GLOVE) ×2 IMPLANT
GOWN STRL REUS W/ TWL XL LVL3 (GOWN DISPOSABLE) ×2 IMPLANT
GOWN STRL REUS W/TWL XL LVL3 (GOWN DISPOSABLE) ×4
GRAFT DERMACARRIERS 1 TO 1.5 (DISPOSABLE) IMPLANT
GRAFT TISS THERASKIN 2X3 (Tissue) IMPLANT
KIT BASIN OR (CUSTOM PROCEDURE TRAY) ×2 IMPLANT
KIT ROOM TURNOVER OR (KITS) ×2 IMPLANT
MANIFOLD NEPTUNE II (INSTRUMENTS) ×2 IMPLANT
NDL HYPO 25GX1X1/2 BEV (NEEDLE) IMPLANT
NEEDLE HYPO 25GX1X1/2 BEV (NEEDLE) ×2 IMPLANT
NS IRRIG 1000ML POUR BTL (IV SOLUTION) ×2 IMPLANT
PACK ORTHO EXTREMITY (CUSTOM PROCEDURE TRAY) ×2 IMPLANT
PAD ARMBOARD 7.5X6 YLW CONV (MISCELLANEOUS) ×4 IMPLANT
PAD CAST 4YDX4 CTTN HI CHSV (CAST SUPPLIES) IMPLANT
PADDING CAST COTTON 4X4 STRL (CAST SUPPLIES)
STAPLER VISISTAT 35W (STAPLE) ×1 IMPLANT
SUCTION FRAZIER TIP 10 FR DISP (SUCTIONS) ×1 IMPLANT
SUT ETHILON 4 0 PS 2 18 (SUTURE) IMPLANT
SYR CONTROL 10ML LL (SYRINGE) ×1 IMPLANT
TISSUE THERASKIN 2X3 (Tissue) ×2 IMPLANT
TOWEL OR 17X24 6PK STRL BLUE (TOWEL DISPOSABLE) ×2 IMPLANT
TOWEL OR 17X26 10 PK STRL BLUE (TOWEL DISPOSABLE) ×2 IMPLANT
TUBE CONNECTING 12X1/4 (SUCTIONS) ×1 IMPLANT
WATER STERILE IRR 1000ML POUR (IV SOLUTION) ×2 IMPLANT

## 2014-06-15 NOTE — Anesthesia Postprocedure Evaluation (Signed)
  Anesthesia Post-op Note  Patient: [name]  Procedure(s) Performed: Procedure(s): SPLIT THICKNESS SKIN GRAFT RIGHT FOOT (Right)  Patient Location: PACU  Anesthesia Type:General  Level of Consciousness: awake and alert   Airway and Oxygen Therapy: Patient Spontanous Breathing  Post-op Pain: mild  Post-op Assessment: Post-op Vital signs reviewed  Post-op Vital Signs: Reviewed  Last Vitals:  Filed Vitals:   06/15/14 2148  BP: 150/74  Pulse: 67  Temp: 37.4 C  Resp: 20    Complications: No apparent anesthesia complications

## 2014-06-15 NOTE — Anesthesia Preprocedure Evaluation (Addendum)
Anesthesia Evaluation  Patient identified by MRN, date of birth, ID band Patient awake    Reviewed: Allergy & Precautions, NPO status , Patient's Chart, lab work & pertinent test results  History of Anesthesia Complications Negative for: history of anesthetic complications  Airway Mallampati: II   Neck ROM: Full    Dental  (+) Chipped, Dental Advisory Given   Pulmonary COPD COPD inhaler, former smoker,  breath sounds clear to auscultation        Cardiovascular hypertension, Pt. on medications - anginaRhythm:Regular Rate:Normal     Neuro/Psych negative neurological ROS     GI/Hepatic negative GI ROS, Neg liver ROS,   Endo/Other  diabetes, Insulin Dependent  Renal/GU Renal InsufficiencyRenal disease (creat 2.27, K 4.7)     Musculoskeletal  (+) Arthritis -,   Abdominal   Peds  Hematology  (+) Blood dyscrasia (Hb 7.3), anemia ,   Anesthesia Other Findings   Reproductive/Obstetrics LMP 05/20/14, Pt on period currently. pt declines pregnancy testing, says no exposure/relations                            Anesthesia Physical  Anesthesia Plan  ASA: III  Anesthesia Plan: General   Post-op Pain Management:    Induction: Intravenous  Airway Management Planned: LMA  Additional Equipment:   Intra-op Plan:   Post-operative Plan:   Informed Consent: I have reviewed the patients History and Physical, chart, labs and discussed the procedure including the risks, benefits and alternatives for the proposed anesthesia with the patient or authorized representative who has indicated his/her understanding and acceptance.   Dental advisory given  Plan Discussed with: CRNA and Surgeon  Anesthesia Plan Comments:         Anesthesia Quick Evaluation

## 2014-06-15 NOTE — Anesthesia Procedure Notes (Signed)
Procedure Name: LMA Insertion Date/Time: 06/15/2014 8:10 PM Performed by: Valetta Fuller Pre-anesthesia Checklist: Patient identified, Emergency Drugs available, Suction available and Patient being monitored Patient Re-evaluated:Patient Re-evaluated prior to inductionOxygen Delivery Method: Circle system utilized Preoxygenation: Pre-oxygenation with 100% oxygen Intubation Type: IV induction Ventilation: Mask ventilation without difficulty LMA: LMA inserted LMA Size: 4.0 Number of attempts: 1 Placement Confirmation: positive ETCO2 and breath sounds checked- equal and bilateral Tube secured with: Tape Dental Injury: Teeth and Oropharynx as per pre-operative assessment

## 2014-06-15 NOTE — H&P (View-Only) (Signed)
Surgery canceled due to unavailability of the operating room this evening due to multiple emergency surgeries. We will plan to add-on surgery for tomorrow evening.

## 2014-06-15 NOTE — Consult Note (Signed)
WOC consult requested prior to ortho service involvement.  They are now following for assessment and plan of care for foot wound and plan to take pt to the OR today.  Please refer to this team for further questions. Please re-consult if further assistance is needed.  Thank-you,  Julien Girt MSN, [place], [place], [place], [place]

## 2014-06-15 NOTE — Progress Notes (Signed)
[facility] PROGRESS NOTE  [name] [id number] DOB: May 04, 1970 DOA: 06/02/2014 PCP: No primary care provider on file.  Assessment/Plan:    Cellulitis of right foot: s/p I and D. Surgery rescheduled for today.    Type 1 diabetes mellitus with neurological manifestations, uncontrolled: continue insulin pump. 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 D/c foley tomorrow (kept in for surgery today)   DM type 1 causing renal disease Chronic anemia: continue iron, aranesp. H/h stable  HPI/Subjective: Asleep. Did not rouse  Objective: Filed Vitals:   06/15/14 0959  BP: 165/91  Pulse: 81  Temp: 98.9 F (37.2 C)  Resp: 17    Intake/Output Summary (Last 24 hours) at 06/15/14 1520 Last data filed at 06/15/14 1000  Gross per 24 hour  Intake    782 ml  Output   1451 ml  Net   -669 ml   Filed Weights   06/07/14 2100 06/08/14 2100 06/14/14 2259  Weight: 61.5 kg (135 lb 9.3 oz) 62.823 kg (138 lb 8 oz) 62.687 kg (138 lb 3.2 oz)    Exam:   General:  Asleep. Appears comfortable  GU: foley in  Ext dressing CDI    Basic Metabolic Panel:  Recent Labs Lab 06/11/14 0611 06/12/14 0546 06/13/14 0440 06/14/14 0607 06/15/14 0545  NA 134* 135 136 136 138  K 4.1 4.0 4.2 4.8 4.7  CL 97 99 102 104 105  CO2 23 24 25 24 23   GLUCOSE 253* 245* 307* 180* 264*  BUN 37* 29* 26* 29* 24*  CREATININE 3.99* 3.28* 2.98* 2.49* 2.27*  CALCIUM 7.7* 8.2* 8.1* 8.2* 8.3*   Liver Function Tests: No results for input(s): AST, ALT, ALKPHOS, BILITOT, PROT, ALBUMIN in the last 168 hours. No results for input(s): LIPASE, AMYLASE in the last 168 hours. No results for input(s): AMMONIA in the last 168 hours. CBC:  Recent Labs Lab 06/09/14 0555 06/10/14 0114 06/15/14 0545  WBC 11.5* 11.8* 11.2*  HGB 7.3* 7.1* 7.3*  HCT 23.0* 22.3* 24.3*  MCV 79.9 77.7* 82.1  PLT 431* 531* 826*   Cardiac Enzymes: No results for input(s): CKTOTAL, CKMB,  CKMBINDEX, TROPONINI in the last 168 hours. BNP (last 3 results) No results for input(s): BNP in the last 8760 hours.  ProBNP (last 3 results) No results for input(s): PROBNP in the last 8760 hours.  CBG:  Recent Labs Lab 06/14/14 1715 06/14/14 2247 06/15/14 0312 06/15/14 0743 06/15/14 1137  GLUCAP 97 52* 156* 256* 312*    Recent Results (from the past 240 hour(s))  Clostridium Difficile by PCR     Status: None   Collection Time: 06/06/14 10:12 AM  Result Value Ref Range Status   C difficile by pcr NEGATIVE NEGATIVE Final  Clostridium Difficile by PCR     Status: None   Collection Time: 06/10/14 12:10 AM  Result Value Ref Range Status   C difficile by pcr NEGATIVE NEGATIVE Final  Tissue culture     Status: None   Collection Time: 06/10/14  7:56 PM  Result Value Ref Range Status   Specimen Description TISSUE FOOT RIGHT  Final   Special Requests PATIENT ON FOLLOWING PIPERACILLEN  Final   Gram Stain   Final    FEW WBC PRESENT,BOTH PMN AND MONONUCLEAR NO SQUAMOUS EPITHELIAL CELLS SEEN NO ORGANISMS SEEN Performed at [facility]    Culture   Final    FEW CANDIDA ALBICANS Performed at [facility]    Report  Status 06/14/2014 FINAL  Final     Studies: No results found.  Scheduled Meds: . benzonatate  100 mg Oral TID  . calcitRIOL  0.25 mcg Oral Daily  . darbepoetin (ARANESP) injection - NON-DIALYSIS  60 mcg Subcutaneous Q Fri-1800  . docusate sodium  100 mg Oral BID  . ferric gluconate (FERRLECIT/NULECIT) IV  250 mg Intravenous QODAY  . folic acid  1 mg Oral Daily  . gabapentin  300 mg Oral QHS  . heparin subcutaneous  5,000 Units Subcutaneous 3 times per day  . insulin pump   Subcutaneous TID AC, HS, 0200  . loratadine  10 mg Oral Daily  . pantoprazole  40 mg Oral Q1200  . piperacillin-tazobactam (ZOSYN)  IV  3.375 g Intravenous 3 times per day  . pravastatin  20 mg Oral Daily  . vancomycin  500 mg Intravenous Q24H  . vitamin B-12  100 mcg  Oral Daily   Continuous Infusions:    Time spent: 15 minutes  [facility] [email or web address], password [facility] 06/15/2014, 3:20 PM  LOS: 13 days

## 2014-06-15 NOTE — Op Note (Signed)
06/02/2014 - 06/15/2014  8:34 PM  PATIENT:  [name]    PRE-OPERATIVE DIAGNOSIS:  right foot wound  POST-OPERATIVE DIAGNOSIS:  Same  PROCEDURE:  SPLIT THICKNESS SKIN GRAFT RIGHT FOOT Application of wound VAC. Local tissue rearrangement for wound closure 10 x 3 cm Excisional debridement skin and soft tissue muscle and tendons right foot  SURGEON:  Jadis Pitter V, MD  PHYSICIAN ASSISTANT:None ANESTHESIA:   General  PREOPERATIVE INDICATIONS:  ANALICE HOLTZ is a  44 y.o. female with a diagnosis of right foot wound who failed conservative measures and elected for surgical management.    The risks benefits and alternatives were discussed with the patient preoperatively including but not limited to the risks of infection, bleeding, nerve injury, cardiopulmonary complications, the need for revision surgery, among others, and the patient was willing to proceed.  OPERATIVE IMPLANTS: TheraSkin 2 x 3" implant  OPERATIVE FINDINGS: Viable wound edges and deep muscle  OPERATIVE PROCEDURE: Patient was brought to the operating room and underwent a general anesthetic. After adequate levels of anesthesia were obtained patient's right lower extremity was prepped using DuraPrep draped into a sterile field. A timeout was called. The wound edges were excised muscle and tendons were excised that were nonviable. The family wound was irrigated with normal saline. Distal and proximal  aspect of the wounds were rearranged for partial wound closure. TheraSkin split thickness skin graft was applied this was secured with staples. Mepitel dressing was applied followed by a wound VAC sponge this was covered with Ioban and the wound had good suction at -75 mmHg. Patient was extubated taken to the PACU in stable condition.

## 2014-06-15 NOTE — Interval H&P Note (Signed)
History and Physical Interval Note:  06/15/2014 6:16 AM  [name]  has presented today for surgery, with the diagnosis of right foot wound  The various methods of treatment have been discussed with the patient and family. After consideration of risks, benefits and other options for treatment, the patient has consented to  Procedure(s): SPLIT THICKNESS SKIN GRAFT RIGHT FOOT (Right) as a surgical intervention .  The patient's history has been reviewed, patient examined, no change in status, stable for surgery.  I have reviewed the patient's chart and labs.  Questions were answered to the patient's satisfaction.     DUDA,MARCUS V

## 2014-06-15 NOTE — Interval H&P Note (Signed)
History and Physical Interval Note:  06/15/2014 7:58 PM  [name]  has presented today for surgery, with the diagnosis of right foot wound  The various methods of treatment have been discussed with the patient and family. After consideration of risks, benefits and other options for treatment, the patient has consented to  Procedure(s): SPLIT THICKNESS SKIN GRAFT RIGHT FOOT (Right) as a surgical intervention .  The patient's history has been reviewed, patient examined, no change in status, stable for surgery.  I have reviewed the patient's chart and labs.  Questions were answered to the patient's satisfaction.     Dollye Glasser V

## 2014-06-15 NOTE — Transfer of Care (Signed)
Immediate Anesthesia Transfer of Care Note  Patient: [name]  Procedure(s) Performed: Procedure(s): SPLIT THICKNESS SKIN GRAFT RIGHT FOOT (Right)  Patient Location: PACU  Anesthesia Type:General  Level of Consciousness: awake, alert  and oriented  Airway & Oxygen Therapy: Patient connected to nasal cannula oxygen  Post-op Assessment: Report given to RN, Post -op Vital signs reviewed and stable and Patient moving all extremities X 4  Post vital signs: Reviewed and stable  Last Vitals:  Filed Vitals:   06/15/14 1722  BP: 160/91  Pulse: 74  Temp: 37.3 C  Resp: 17    Complications: No apparent anesthesia complications

## 2014-06-15 NOTE — Progress Notes (Signed)
Attempted to call short stay to give report on the patient but couldn't, phone was ringing but no one was lifting the phone.

## 2014-06-16 ENCOUNTER — Encounter (HOSPITAL_COMMUNITY): Payer: Self-pay | Admitting: Orthopedic Surgery

## 2014-06-16 LAB — GLUCOSE, CAPILLARY
GLUCOSE-CAPILLARY: 144 mg/dL — AB (ref 70–99)
GLUCOSE-CAPILLARY: 365 mg/dL — AB (ref 70–99)
GLUCOSE-CAPILLARY: 55 mg/dL — AB (ref 70–99)
Glucose-Capillary: 140 mg/dL — ABNORMAL HIGH (ref 70–99)
Glucose-Capillary: 219 mg/dL — ABNORMAL HIGH (ref 70–99)
Glucose-Capillary: 305 mg/dL — ABNORMAL HIGH (ref 70–99)

## 2014-06-16 NOTE — Progress Notes (Signed)
Patient with insulin pump and elevated glucose. Discussed with patient the need to change site. Patient not receptive and said she just wanted to take a nap.Stated she already gave herself an extra bolus.Reiterated the need to change site if glucose does not decrease. Follow up tomorrow.  Courtney Heys PhD, RN, BC-ADM Diabetes Coordinator  Office:  [phone] Team Pager:  [phone]

## 2014-06-16 NOTE — Progress Notes (Signed)
Physical Therapy Treatment Patient Details Name: [name] MRN: [id number] DOB: 08-28-1970 Today's Date: 06/16/2014    History of Present Illness Pt is a 44 y.o. female with h/o poorly controlled DM1, osteomyelitis of left foot due to diabetic foot ulcer. Patient presents with worsening blister and cellulitis of her right foot due to a diabetic foot ulcer. X ray was negative for osteomyelitis. There have been associated fever and chills over the past week or so.She has been on bactrim for her other foot for about 2 weeks now.Other foot wound is healing she states. Pt is now s/p I&D of the R foot..Pt underwent skin graft to rt foot on 3/15.     PT Comments    Pt prefers using rollator to amb by placing knee on seat of rollator to maintain NWB. She has a rollator that a family member brought for her. Pt understands that this isn't the way a rollator is intended to be used but she does quite well with it.  Follow Up Recommendations  Supervision - Intermittent;No PT follow up     Equipment Recommendations  None recommended by PT. Pt to use rollator that she got from a family member.   Recommendations for Other Services       Precautions / Restrictions Precautions Precautions: Fall Restrictions Weight Bearing Restrictions: Yes RLE Weight Bearing: Non weight bearing    Mobility  Bed Mobility Overal bed mobility: Independent                Transfers Overall transfer level: Modified independent Equipment used: 4-wheeled walker Transfers: Sit to/from Stand Sit to Stand: Modified independent (Device/Increase time)            Ambulation/Gait Ambulation/Gait assistance: Supervision Ambulation Distance (Feet): 150 Feet Assistive device: 4-wheeled walker Gait Pattern/deviations: Step-to pattern   Gait velocity interpretation: Below normal speed for age/gender General Gait Details: Pt placed rt knee on seat of rollator to maintain NWB of rt foot.   Stairs             Wheelchair Mobility    Modified Rankin (Stroke Patients Only)       Balance Overall balance assessment: Needs assistance   Sitting balance-Leahy Scale: Normal     Standing balance support: Single extremity supported   Standing balance comment: extremity support needed only because of NWB of rt foot.                    Cognition Arousal/Alertness: Awake/alert Behavior During Therapy: WFL for tasks assessed/performed Overall Cognitive Status: Within Functional Limits for tasks assessed                      Exercises      General Comments        Pertinent Vitals/Pain      Home Living                      Prior Function            PT Goals (current goals can now be found in the care plan section) Progress towards PT goals: Progressing toward goals    Frequency  Min 5X/week    PT Plan Current plan remains appropriate;Equipment recommendations need to be updated    Co-evaluation             End of Session   Activity Tolerance: Patient tolerated treatment well Patient left: in bed;with call bell/phone within reach  Time: [id number] PT Time Calculation (min) (ACUTE ONLY): 41 min  Charges:  $Gait Training: 23-37 mins                    G Codes:      Kawhi Diebold 07/11/2014, 12:32 PM  [facility] [phone]

## 2014-06-16 NOTE — Progress Notes (Signed)
[facility] PROGRESS NOTE  [name] [id number] DOB: July 21, 1970 DOA: 06/02/2014 PCP: No primary care provider on file.  Assessment/Plan:    Cellulitis of right foot: s/p I and D. Surgery rescheduled for today.    Type 1 diabetes mellitus with neurological manifestations, uncontrolled: continue insulin pump. 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 D/c foley tomorrow (kept in for surgery today)   DM type 1 causing renal disease Chronic anemia: continue iron, aranesp. H/h stable Foley out  HPI/Subjective: Worried about going home Friday. "can I stay even if Dr. Julieanne Cotton I can go home?"  Objective: Filed Vitals:   06/16/14 0829  BP: 164/84  Pulse: 88  Temp: 98.4 F (36.9 C)  Resp: 17    Intake/Output Summary (Last 24 hours) at 06/16/14 1418 Last data filed at 06/16/14 1126  Gross per 24 hour  Intake    850 ml  Output   2452 ml  Net  -1602 ml   Filed Weights   06/08/14 2100 06/14/14 2259 06/15/14 2148  Weight: 62.823 kg (138 lb 8 oz) 62.687 kg (138 lb 3.2 oz) 62.823 kg (138 lb 8 oz)    Exam:   General:  Alert, talkative oriented  Lungs CTA without WRR  CV RRR without WRR  GU: foley out  Ext right foot with mild edema. Wound vac in place    Basic Metabolic Panel:  Recent Labs Lab 06/11/14 0611 06/12/14 0546 06/13/14 0440 06/14/14 0607 06/15/14 0545  NA 134* 135 136 136 138  K 4.1 4.0 4.2 4.8 4.7  CL 97 99 102 104 105  CO2 23 24 25 24 23   GLUCOSE 253* 245* 307* 180* 264*  BUN 37* 29* 26* 29* 24*  CREATININE 3.99* 3.28* 2.98* 2.49* 2.27*  CALCIUM 7.7* 8.2* 8.1* 8.2* 8.3*   Liver Function Tests: No results for input(s): AST, ALT, ALKPHOS, BILITOT, PROT, ALBUMIN in the last 168 hours. No results for input(s): LIPASE, AMYLASE in the last 168 hours. No results for input(s): AMMONIA in the last 168 hours. CBC:  Recent Labs Lab 06/10/14 0114 06/15/14 0545  WBC 11.8* 11.2*  HGB 7.1* 7.3*   HCT 22.3* 24.3*  MCV 77.7* 82.1  PLT 531* 826*   Cardiac Enzymes: No results for input(s): CKTOTAL, CKMB, CKMBINDEX, TROPONINI in the last 168 hours. BNP (last 3 results) No results for input(s): BNP in the last 8760 hours.  ProBNP (last 3 results) No results for input(s): PROBNP in the last 8760 hours.  CBG:  Recent Labs Lab 06/16/14 0018 06/16/14 0209 06/16/14 0440 06/16/14 0729 06/16/14 1124  GLUCAP 55* 140* 144* 219* 365*    Recent Results (from the past 240 hour(s))  Clostridium Difficile by PCR     Status: None   Collection Time: 06/10/14 12:10 AM  Result Value Ref Range Status   C difficile by pcr NEGATIVE NEGATIVE Final  Tissue culture     Status: None   Collection Time: 06/10/14  7:56 PM  Result Value Ref Range Status   Specimen Description TISSUE FOOT RIGHT  Final   Special Requests PATIENT ON FOLLOWING PIPERACILLEN  Final   Gram Stain   Final    FEW WBC PRESENT,BOTH PMN AND MONONUCLEAR NO SQUAMOUS EPITHELIAL CELLS SEEN NO ORGANISMS SEEN Performed at [facility]    Culture   Final    FEW CANDIDA ALBICANS Performed at [facility]    Report Status 06/14/2014 FINAL  Final  Studies: No results found.  Scheduled Meds: . calcitRIOL  0.25 mcg Oral Daily  . darbepoetin (ARANESP) injection - NON-DIALYSIS  60 mcg Subcutaneous Q Fri-1800  . ferric gluconate (FERRLECIT/NULECIT) IV  250 mg Intravenous QODAY  . folic acid  1 mg Oral Daily  . gabapentin  300 mg Oral QHS  . heparin subcutaneous  5,000 Units Subcutaneous 3 times per day  . insulin pump   Subcutaneous TID AC, HS, 0200  . loratadine  10 mg Oral Daily  . pantoprazole  40 mg Oral Q1200  . piperacillin-tazobactam (ZOSYN)  IV  3.375 g Intravenous 3 times per day  . pravastatin  20 mg Oral Daily  . vancomycin  500 mg Intravenous Q24H  . vitamin B-12  100 mcg Oral Daily   Continuous Infusions: . sodium chloride 10 mL/hr at 06/16/14 0028    Time spent: 15  minutes  [facility] [email or web address], password [facility] 06/16/2014, 2:18 PM  LOS: 14 days

## 2014-06-16 NOTE — Progress Notes (Signed)
Resting quietly in bed , wound vac  @75  pressure  No leak detected ,Instructed patient not to touch seal or lift corners of dressing .MD will see you in am and give staff instructions concerning post-procedure instructions.  Patient verbalized understanding of the above.

## 2014-06-16 NOTE — Progress Notes (Signed)
Patient ID: [name], female   DOB: 06/27/1970, 44 y.o.   MRN: [id number] Postoperative day 1 split-thickness skin graft right foot. Anticipated patient can be discharged to home on Friday. We will remove the wound VAC on Friday. Patient can start Silvadene dressing changes daily after wound VAC removed.Marland Kitchen Discharge to home on oral antibiotics.

## 2014-06-17 DIAGNOSIS — E1029 Type 1 diabetes mellitus with other diabetic kidney complication: Secondary | ICD-10-CM

## 2014-06-17 LAB — BASIC METABOLIC PANEL
ANION GAP: 12 (ref 5–15)
BUN: 26 mg/dL — ABNORMAL HIGH (ref 6–23)
CO2: 18 mmol/L — AB (ref 19–32)
Calcium: 8.6 mg/dL (ref 8.4–10.5)
Chloride: 107 mmol/L (ref 96–112)
Creatinine, Ser: 2.37 mg/dL — ABNORMAL HIGH (ref 0.50–1.10)
GFR, EST AFRICAN AMERICAN: 28 mL/min — AB (ref >90)
GFR, EST NON AFRICAN AMERICAN: 24 mL/min — AB (ref >90)
GLUCOSE: 152 mg/dL — AB (ref 70–99)
Potassium: 5.2 mmol/L — ABNORMAL HIGH (ref 3.5–5.1)
Sodium: 137 mmol/L (ref 135–145)

## 2014-06-17 LAB — VANCOMYCIN, TROUGH: Vancomycin Tr: 13.9 ug/mL (ref 10.0–20.0)

## 2014-06-17 LAB — GLUCOSE, CAPILLARY
GLUCOSE-CAPILLARY: 172 mg/dL — AB (ref 70–99)
Glucose-Capillary: 189 mg/dL — ABNORMAL HIGH (ref 70–99)
Glucose-Capillary: 138 mg/dL — ABNORMAL HIGH (ref 70–99)
Glucose-Capillary: 123 mg/dL — ABNORMAL HIGH (ref 70–99)
Glucose-Capillary: 112 mg/dL — ABNORMAL HIGH (ref 70–99)

## 2014-06-17 MED ORDER — VANCOMYCIN HCL 500 MG IV SOLR
500.0000 mg | INTRAVENOUS | Status: DC
  Administered 2014-06-17: 500 mg via INTRAVENOUS
  Filled 2014-06-17 (×2): qty 500

## 2014-06-17 MED ORDER — SILVER SULFADIAZINE 1 % EX CREA
TOPICAL_CREAM | Freq: Every day | CUTANEOUS | Status: DC
  Administered 2014-06-17 – 2014-06-18 (×2): via TOPICAL
  Filled 2014-06-17: qty 85

## 2014-06-17 NOTE — Progress Notes (Signed)
[facility] PROGRESS NOTE  [name] [id number] DOB: 08-17-1970 DOA: 06/02/2014 PCP: No primary care provider on file.  Assessment/Plan:    Cellulitis/abscess of right foot: s/p I and D, skin graft. Wound vac off tomorrow. Pt does not meet criteria for CIR or SNF. Will go home with woundcare when cleared by Dr. Sharol Given. Loose stool: c diff negative. Offered imodium. Pt refuses Right calf pain. Check doppler r/o DVT   Type 1 diabetes mellitus with neurological manifestations, uncontrolled: continue insulin pump. 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 H/h stable Mild hyperkalemia: monitor. Insulin will help  HPI/Subjective: Upset about discharge tomorrow. Reports loose stool and right leg pain.  Objective: Filed Vitals:   06/17/14 2113  BP: 170/76  Pulse: 81  Temp: 98.5 F (36.9 C)  Resp: 18    Intake/Output Summary (Last 24 hours) at 06/17/14 2203 Last data filed at 06/17/14 1424  Gross per 24 hour  Intake    274 ml  Output      0 ml  Net    274 ml   Filed Weights   06/14/14 2259 06/15/14 2148 06/17/14 2113  Weight: 62.687 kg (138 lb 3.2 oz) 62.823 kg (138 lb 8 oz) 91.763 kg (202 lb 4.8 oz)    Exam:   General:  Alert, sarcastic and belligerent. And manipulative  Lungs CTA without WRR  CV RRR without WRR  GU: foley out  Ext right foot with less edema. No calf tenderness or edema. homan negative.    Basic Metabolic Panel:  Recent Labs Lab 06/12/14 0546 06/13/14 0440 06/14/14 0607 06/15/14 0545 06/17/14 0635  NA 135 136 136 138 137  K 4.0 4.2 4.8 4.7 5.2*  CL 99 102 104 105 107  CO2 24 25 24 23  18*  GLUCOSE 245* 307* 180* 264* 152*  BUN 29* 26* 29* 24* 26*  CREATININE 3.28* 2.98* 2.49* 2.27* 2.37*  CALCIUM 8.2* 8.1* 8.2* 8.3* 8.6   Liver Function Tests: No results for input(s): AST, ALT, ALKPHOS, BILITOT, PROT, ALBUMIN in  the last 168 hours. No results for input(s): LIPASE, AMYLASE in the last 168 hours. No results for input(s): AMMONIA in the last 168 hours. CBC:  Recent Labs Lab 06/15/14 0545  WBC 11.2*  HGB 7.3*  HCT 24.3*  MCV 82.1  PLT 826*   Cardiac Enzymes: No results for input(s): CKTOTAL, CKMB, CKMBINDEX, TROPONINI in the last 168 hours. BNP (last 3 results) No results for input(s): BNP in the last 8760 hours.  ProBNP (last 3 results) No results for input(s): PROBNP in the last 8760 hours.  CBG:  Recent Labs Lab 06/16/14 1625 06/16/14 2106 06/17/14 0749 06/17/14 1259 06/17/14 1656  GLUCAP 305* 189* 138* 123* 112*    Recent Results (from the past 240 hour(s))  Clostridium Difficile by PCR     Status: None   Collection Time: 06/10/14 12:10 AM  Result Value Ref Range Status   C difficile by pcr NEGATIVE NEGATIVE Final  Tissue culture     Status: None   Collection Time: 06/10/14  7:56 PM  Result Value Ref Range Status   Specimen Description TISSUE FOOT RIGHT  Final   Special Requests PATIENT ON FOLLOWING PIPERACILLEN  Final   Gram Stain   Final    FEW WBC PRESENT,BOTH PMN AND MONONUCLEAR NO SQUAMOUS EPITHELIAL CELLS SEEN NO ORGANISMS SEEN Performed at [facility]  Culture   Final    FEW CANDIDA ALBICANS Performed at [facility]    Report Status 06/14/2014 FINAL  Final     Studies: No results found.  Scheduled Meds: . calcitRIOL  0.25 mcg Oral Daily  . darbepoetin (ARANESP) injection - NON-DIALYSIS  60 mcg Subcutaneous Q Fri-1800  . folic acid  1 mg Oral Daily  . gabapentin  300 mg Oral QHS  . heparin subcutaneous  5,000 Units Subcutaneous 3 times per day  . insulin pump   Subcutaneous TID AC, HS, 0200  . loratadine  10 mg Oral Daily  . pantoprazole  40 mg Oral Q1200  . piperacillin-tazobactam (ZOSYN)  IV  3.375 g Intravenous 3 times per day  . pravastatin  20 mg Oral Daily  . silver sulfADIAZINE   Topical Daily  . vancomycin  500 mg  Intravenous Q24H  . vitamin B-12  100 mcg Oral Daily   Continuous Infusions: . sodium chloride 10 mL/hr at 06/16/14 0028    Time spent: 15 minutes  [facility] [email or web address], password [facility] 06/17/2014, 10:03 PM  LOS: 15 days

## 2014-06-17 NOTE — Progress Notes (Signed)
Physical Therapy Treatment Patient Details Name: KARLEIGH GONGORA MRN: [id number] DOB: 1970/10/19 Today's Date: 06/17/2014    History of Present Illness Pt is a 44 y.o. female with h/o poorly controlled DM1, osteomyelitis of left foot due to diabetic foot ulcer. Patient presents with worsening blister and cellulitis of her right foot due to a diabetic foot ulcer. X ray was negative for osteomyelitis. There have been associated fever and chills over the past week or so.She has been on bactrim for her other foot for about 2 weeks now.Other foot wound is healing she states. Pt is now s/p I&D of the R foot..Pt underwent skin graft to rt foot on 3/15.     PT Comments    Pt doing well with mobility using rollator and placing rt knee on rollator for NWB on rt foot. Pt verbalizes understanding that this is not intended use of rollator. Pt doesn't want to try crutches at this point due to IV in bend of wrist. Pt feels she can manage stairs with crutches at home. Do not feel pt's level of function will change significantly until she is allowed weight bearing.  Follow Up Recommendations  Supervision - Intermittent;No PT follow up     Equipment Recommendations  None recommended by PT. Pt has rollator from family member.   Recommendations for Other Services       Precautions / Restrictions Precautions Precautions: None Restrictions Weight Bearing Restrictions: Yes RLE Weight Bearing: Non weight bearing    Mobility  Bed Mobility Overal bed mobility: Independent                Transfers Overall transfer level: Modified independent Equipment used: 4-wheeled walker   Sit to Stand: Modified independent (Device/Increase time)            Ambulation/Gait Ambulation/Gait assistance: Modified independent (Device/Increase time) Ambulation Distance (Feet): 200 Feet Assistive device: 4-wheeled walker Gait Pattern/deviations: Step-to pattern     General Gait Details: Pt placed rt  knee on seat of rollator to maintain NWB of rt foot. Pt able to demonstrate sitting on the rollator seat.   Stairs            Wheelchair Mobility    Modified Rankin (Stroke Patients Only)       Balance     Sitting balance-Leahy Scale: Normal         Standing balance comment: Extremity support needed only because of NWB of rt foot.                    Cognition Arousal/Alertness: Awake/alert Behavior During Therapy: WFL for tasks assessed/performed Overall Cognitive Status: Within Functional Limits for tasks assessed                      Exercises      General Comments        Pertinent Vitals/Pain      Home Living                      Prior Function            PT Goals (current goals can now be found in the care plan section) Progress towards PT goals: Progressing toward goals    Frequency  Min 5X/week    PT Plan Current plan remains appropriate    Co-evaluation             End of Session   Activity Tolerance: Patient tolerated treatment well Patient  left: in bed;with call bell/phone within reach     Time: 1155-1224 PT Time Calculation (min) (ACUTE ONLY): 29 min  Charges:  $Gait Training: 23-37 mins                    G Codes:      Anias Bartol 06-25-14, 1:36 PM  [employer] PT [phone]

## 2014-06-17 NOTE — Progress Notes (Signed)
PT Cancellation Note  Patient Details Name: [name] MRN: [id number] DOB: 1970/07/08   Cancelled Treatment:    Reason Eval/Treat Not Completed: Other (comment) (Pt wanted to eat breakfast.) Will try again later.   [name] 06/17/2014, 9:51 AM  [place]

## 2014-06-17 NOTE — Progress Notes (Signed)
Patient ID: [name], female   DOB: Nov 17, 1970, 44 y.o.   MRN: [id number] Patient complains of throbbing pain when she gets up to go to the bathroom. On examination patient's calf is soft nontender to palpation Achilles tendon is nontender to palpation no redness no cellulitis no signs of DVT or infection. Her left foot wound which is approximately 15 mm in diameter and 2 mm deep is stable with good granulation tissue no signs of infection. The wound VAC is functioning well.  We'll plan to remove the wound VAC tomorrow. I feel the patient's throbbing is due to dependent swelling. Patient and her mother both feel they are not safe for discharge to home. Orders written for evaluation for rehabilitation placement.

## 2014-06-17 NOTE — Care Management Note (Signed)
CARE MANAGEMENT NOTE 06/17/2014  Patient:  [name], [name]   Account Number:  [id number]  Date Initiated:  06/17/2014  Documentation initiated by:  Lara Palinkas  Subjective/Objective Assessment:   CM following for progression and d/c planning.     Action/Plan:   06/17/2014 Noted consult for Inpt rehab. This CM contacted Inpt rehab coordinator Prescott Gum RN to discuss pt and PT recommendations. This pt is not a candidate for Inpt Rehab as there are no  recommendations for ongoing PT , supervision only.   Anticipated DC Date:  06/18/2014   Anticipated DC Plan:  [place]         Choice offered to / List presented to:          [facility] arranged  HH-1 RN      Status of service:   Medicare Important Message given?   (If response is "NO", the following Medicare IM given date fields will be blank) Date Medicare IM given:   Medicare IM given by:   Date Additional Medicare IM given:   Additional Medicare IM given by:    Discharge Disposition:    Per UR Regulation:    If discussed at Long Length of Stay Meetings, dates discussed:    Comments:

## 2014-06-17 NOTE — Progress Notes (Signed)
ANTIBIOTIC CONSULT NOTE - FOLLOW UP  Pharmacy Consult for Vancomycin + Zosyn Indication: Right diabetic foot infection/cellulitis  Allergies  Allergen Reactions  . Ciprofloxacin Itching  . Cleocin [Clindamycin Hcl]     Diarrhea     Patient Measurements: Height: 5\' 7"  (170.2 cm) Weight: 138 lb 8 oz (62.823 kg) IBW/kg (Calculated) : 61.6  Vital Signs: Temp: 98.5 F (36.9 C) (03/17 1048) Temp Source: Oral (03/17 1048) BP: 162/87 mmHg (03/17 1048) Pulse Rate: 76 (03/17 1048) Intake/Output from previous day: 03/16 0701 - 03/17 0700 In: 600 [P.O.:600] Out: 652 [Urine:651; Stool:1] Intake/Output from this shift:    Labs:  Recent Labs  06/15/14 0545 06/17/14 0635  WBC 11.2*  --   HGB 7.3*  --   PLT 826*  --   CREATININE 2.27* 2.37*   Estimated Creatinine Clearance: 30.1 mL/min (by C-G formula based on Cr of 2.37).  Recent Labs  06/17/14 1035  [place] 13.9     Microbiology: Recent Results (from the past 720 hour(s))  Blood culture (routine x 2)     Status: None   Collection Time: 06/02/14  6:35 PM  Result Value Ref Range Status   Specimen Description BLOOD LEFT FA  Final   Special Requests BOTTLES DRAWN AEROBIC AND ANAEROBIC 5CC EACH  Final   Culture   Final    NO GROWTH 5 DAYS Performed at [facility]    Report Status 06/09/2014 FINAL  Final  Blood culture (routine x 2)     Status: None   Collection Time: 06/02/14  7:00 PM  Result Value Ref Range Status   Specimen Description BLOOD RIGHT AC  Final   Special Requests BOTTLES DRAWN AEROBIC AND ANAEROBIC 5 CC EACH  Final   Culture   Final    NO GROWTH 5 DAYS Performed at [facility]    Report Status 06/09/2014 FINAL  Final  MRSA PCR Screening     Status: None   Collection Time: 06/03/14 12:12 AM  Result Value Ref Range Status   MRSA by PCR NEGATIVE NEGATIVE Final    Comment:        The GeneXpert MRSA Assay (FDA approved for NASAL specimens only), is one component of  a comprehensive MRSA colonization surveillance program. It is not intended to diagnose MRSA infection nor to guide or monitor treatment for MRSA infections.   Clostridium Difficile by PCR     Status: None   Collection Time: 06/06/14 10:12 AM  Result Value Ref Range Status   C difficile by pcr NEGATIVE NEGATIVE Final  Clostridium Difficile by PCR     Status: None   Collection Time: 06/10/14 12:10 AM  Result Value Ref Range Status   C difficile by pcr NEGATIVE NEGATIVE Final  Tissue culture     Status: None   Collection Time: 06/10/14  7:56 PM  Result Value Ref Range Status   Specimen Description TISSUE FOOT RIGHT  Final   Special Requests PATIENT ON FOLLOWING PIPERACILLEN  Final   Gram Stain   Final    FEW WBC PRESENT,BOTH PMN AND MONONUCLEAR NO SQUAMOUS EPITHELIAL CELLS SEEN NO ORGANISMS SEEN Performed at [facility]    Culture   Final    FEW CANDIDA ALBICANS Performed at [facility]    Report Status 06/14/2014 FINAL  Final    Anti-infectives    Start     Dose/Rate Route Frequency Ordered Stop   06/17/14 1100  vancomycin (VANCOCIN) 500 mg in sodium chloride 0.9 %  100 mL IVPB     500 mg 100 mL/hr over 60 Minutes Intravenous Every 24 hours 06/17/14 0909     06/15/14 0900  vancomycin (VANCOCIN) 500 mg in sodium chloride 0.9 % 100 mL IVPB  Status:  Discontinued     500 mg 100 mL/hr over 60 Minutes Intravenous Every 24 hours 06/14/14 0829 06/17/14 0909   06/14/14 0830  vancomycin (VANCOCIN) 500 mg in sodium chloride 0.9 % 100 mL IVPB     500 mg 100 mL/hr over 60 Minutes Intravenous  Once 06/14/14 0829 06/14/14 0933   06/13/14 1400  piperacillin-tazobactam (ZOSYN) IVPB 3.375 g     3.375 g 12.5 mL/hr over 240 Minutes Intravenous 3 times per day 06/13/14 1216     06/12/14 2000  vancomycin (VANCOCIN) IVPB 750 mg/150 ml premix     750 mg 150 mL/hr over 60 Minutes Intravenous  Once 06/12/14 0931 06/13/14 0111   06/11/14 0800  vancomycin (VANCOCIN) IVPB 750  mg/150 ml premix  Status:  Discontinued     750 mg 150 mL/hr over 60 Minutes Intravenous every 72 hours 06/11/14 0725 06/11/14 1637   06/09/14 0100  vancomycin (VANCOCIN) 500 mg in sodium chloride 0.9 % 100 mL IVPB  Status:  Discontinued     500 mg 100 mL/hr over 60 Minutes Intravenous Every 36 hours 06/07/14 1533 06/08/14 1151   06/07/14 2200  piperacillin-tazobactam (ZOSYN) IVPB 2.25 g  Status:  Discontinued     2.25 g 100 mL/hr over 30 Minutes Intravenous 3 times per day 06/07/14 1411 06/13/14 1216   06/04/14 1400  piperacillin-tazobactam (ZOSYN) IVPB 3.375 g  Status:  Discontinued     3.375 g 12.5 mL/hr over 240 Minutes Intravenous 3 times per day 06/04/14 1250 06/07/14 1410   06/04/14 1400  vancomycin (VANCOCIN) IVPB 750 mg/150 ml premix  Status:  Discontinued     750 mg 150 mL/hr over 60 Minutes Intravenous Every 24 hours 06/04/14 1329 06/07/14 1533   06/03/14 2200  vancomycin (VANCOCIN) 500 mg in sodium chloride 0.9 % 100 mL IVPB  Status:  Discontinued     500 mg 100 mL/hr over 60 Minutes Intravenous Every 24 hours 06/03/14 0005 06/03/14 1116   06/03/14 1200  cefTRIAXone (ROCEPHIN) 1 g in dextrose 5 % 50 mL IVPB - Premix  Status:  Discontinued     1 g 100 mL/hr over 30 Minutes Intravenous Every 24 hours 06/03/14 1116 06/04/14 1250   06/03/14 1200  metroNIDAZOLE (FLAGYL) IVPB 500 mg  Status:  Discontinued     500 mg 100 mL/hr over 60 Minutes Intravenous Every 8 hours 06/03/14 1116 06/04/14 1250   06/03/14 1000  valACYclovir (VALTREX) tablet 500 mg  Status:  Discontinued     500 mg Oral Daily 06/02/14 2333 06/08/14 1516   06/02/14 1830  vancomycin (VANCOCIN) IVPB 1000 mg/200 mL premix     1,000 mg 200 mL/hr over 60 Minutes Intravenous  Once 06/02/14 1827 06/02/14 2150      Assessment: 52 YOF who continues on Vancomycin + Zosyn for a R-diabetic foot ulcer/cellulitis. Imaging has ruled out osteo this admission. Ortho is on board and the patient is s/p I&D on 3/10 and s/p skin  graft 3/15. The patient was noted to have an acute kidney injury earlier this admit - now resolving and renal has signed off. Currently SCr 2.37 with est CrCl~30-35 ml/min  A vancomycin trough was drawn this morning as patient has resumed a standardized vancomycin dose and today's dose is #4 of  that regimen. Vancomycin trough was in range at 13.37mcg/mL.  Goal of Therapy:  Vancomycin trough level 10-15 mcg/ml  Plan:  - continue Vancomcyin 500 mg IV every 24 hours - Continue Zosyn 3.375g IV every 8 hours - Will continue to follow renal function, LOT, and antibiotic de-escalation plans as well as discharge planning  Ninah Moccio D. Johnette Teigen, PharmD, BCPS Clinical Pharmacist Pager: [phone] 06/17/2014 11:57 AM

## 2014-06-18 DIAGNOSIS — L03115 Cellulitis of right lower limb: Secondary | ICD-10-CM

## 2014-06-18 LAB — BASIC METABOLIC PANEL
ANION GAP: 8 (ref 5–15)
BUN: 32 mg/dL — ABNORMAL HIGH (ref 6–23)
CALCIUM: 8.6 mg/dL (ref 8.4–10.5)
CO2: 21 mmol/L (ref 19–32)
Chloride: 109 mmol/L (ref 96–112)
Creatinine, Ser: 2.66 mg/dL — ABNORMAL HIGH (ref 0.50–1.10)
GFR calc Af Amer: 24 mL/min — ABNORMAL LOW (ref >90)
GFR calc non Af Amer: 21 mL/min — ABNORMAL LOW (ref >90)
GLUCOSE: 254 mg/dL — AB (ref 70–99)
POTASSIUM: 5.3 mmol/L — AB (ref 3.5–5.1)
Sodium: 138 mmol/L (ref 135–145)

## 2014-06-18 LAB — GLUCOSE, CAPILLARY
Glucose-Capillary: 245 mg/dL — ABNORMAL HIGH (ref 70–99)
Glucose-Capillary: 313 mg/dL — ABNORMAL HIGH (ref 70–99)
Glucose-Capillary: 369 mg/dL — ABNORMAL HIGH (ref 70–99)

## 2014-06-18 LAB — CBC
HCT: 26.2 % — ABNORMAL LOW (ref 36.0–46.0)
Hemoglobin: 7.8 g/dL — ABNORMAL LOW (ref 12.0–15.0)
MCH: 25 pg — AB (ref 26.0–34.0)
MCHC: 29.8 g/dL — ABNORMAL LOW (ref 30.0–36.0)
MCV: 84 fL (ref 78.0–100.0)
Platelets: 868 10*3/uL — ABNORMAL HIGH (ref 150–400)
RBC: 3.12 MIL/uL — AB (ref 3.87–5.11)
RDW: 18.7 % — AB (ref 11.5–15.5)
WBC: 10.2 10*3/uL (ref 4.0–10.5)

## 2014-06-18 MED ORDER — SILVER SULFADIAZINE 1 % EX CREA: 1.0000 "application " | TOPICAL_CREAM | Freq: Every day | CUTANEOUS | Status: DC

## 2014-06-18 MED ORDER — DOXYCYCLINE HYCLATE 100 MG PO TABS
100.0000 mg | ORAL_TABLET | Freq: Two times a day (BID) | ORAL | Status: DC
  Administered 2014-06-18: 100 mg via ORAL
  Filled 2014-06-18 (×2): qty 1

## 2014-06-18 MED ORDER — DOXYCYCLINE HYCLATE 100 MG PO TABS
100.0000 mg | ORAL_TABLET | Freq: Two times a day (BID) | ORAL | Status: DC
Start: 2014-06-18 — End: 2014-09-06

## 2014-06-18 MED ORDER — LOPERAMIDE HCL 2 MG PO CAPS: ORAL_CAPSULE | ORAL | Status: DC

## 2014-06-18 MED ORDER — OXYCODONE-ACETAMINOPHEN 5-325 MG PO TABS: 1.0000 | ORAL_TABLET | ORAL | Status: DC | PRN

## 2014-06-18 NOTE — Progress Notes (Signed)
*  PRELIMINARY RESULTS* Vascular Ultrasound Right lower extremity venous duplex has been completed.  Preliminary findings: no evidence of DVT.   Landry Mellow, RDMS, RVT  06/18/2014, 9:40 AM

## 2014-06-18 NOTE — Care Management Note (Signed)
CARE MANAGEMENT NOTE 06/18/2014  Patient:  [name], [name]   Account Number:  [id number]  Date Initiated:  06/17/2014  Documentation initiated by:  Tarus Briski  Subjective/Objective Assessment:   CM following for progression and d/c planning.     Action/Plan:   06/17/2014 Noted consult for Inpt rehab. This CM contacted Inpt rehab coordinator Prescott Gum RN to discuss pt and PT recommendations. This pt is not a candidate for Inpt Rehab as there are no  recommendations for ongoing PT , supervision only.   Anticipated DC Date:  06/18/2014   Anticipated DC Plan:  [place]         Choice offered to / List presented to:          [facility] arranged  HH-1 RN      Status of service:   Medicare Important Message given?   (If response is "NO", the following Medicare IM given date fields will be blank) Date Medicare IM given:   Medicare IM given by:   Date Additional Medicare IM given:   Additional Medicare IM given by:    Discharge Disposition:    Per UR Regulation:    If discussed at Long Length of Stay Meetings, dates discussed:    Comments:

## 2014-06-18 NOTE — Progress Notes (Signed)
Patient Discharge: Disposition:  Patient discharged home with home health. Education: Patient educated on medications, prescriptions, discharge instructions, and follow-up appointments. IV: Discontinued before discharge. Transportation: Patient transported via w/c with staff accompanying. Belongings: Patient took all her belongings with her.

## 2014-06-18 NOTE — Progress Notes (Signed)
Patient ID: [name], female   DOB: 16-Apr-1970, 44 y.o.   MRN: [id number] VAC removed. Skin graft stable viable and healthy. Mepilex and dressing was applied. Patient will start Silvadene dressing changes daily at home. I will follow-up in the office in 2 weeks. Discussed the importance of minimal weightbearing for both lower extremities.  Okay for discharge to home today. Orders written for Percocet for pain and Silvadene for dressing changes.

## 2014-06-18 NOTE — Progress Notes (Signed)
Physical Therapy Treatment Patient Details Name: [name] MRN: [id number] DOB: 08-13-1970 Today's Date: 06/18/2014    History of Present Illness Pt is a 44 y.o. female with h/o poorly controlled DM1, osteomyelitis of left foot due to diabetic foot ulcer. Patient presents with worsening blister and cellulitis of her right foot due to a diabetic foot ulcer. X ray was negative for osteomyelitis. There have been associated fever and chills over the past week or so.She has been on bactrim for her other foot for about 2 weeks now.Other foot wound is healing she states. Pt is now s/p I&D of the R foot..Pt underwent skin graft to rt foot on 3/15.     PT Comments    Pt continues to do well with rollator. The rollator she has been borrowing from family member is having problems with rt front wheel and pt will need her own rollator for home. Declined practicing stairs but is able to verbalize understanding of stairs with crutches or scooting up/down on bottom.   Follow Up Recommendations  Supervision - Intermittent;No PT follow up     Equipment Recommendations  Other (comment) (rollator)    Recommendations for Other Services       Precautions / Restrictions Precautions Precautions: None Restrictions Weight Bearing Restrictions: Yes RLE Weight Bearing: Touchdown weight bearing    Mobility  Bed Mobility Overal bed mobility: Independent                Transfers Overall transfer level: Modified independent Equipment used: 4-wheeled walker                Ambulation/Gait Ambulation/Gait assistance: Modified independent (Device/Increase time) Ambulation Distance (Feet): 175 Feet Assistive device: 4-wheeled walker Gait Pattern/deviations: Step-to pattern   Gait velocity interpretation: Below normal speed for age/gender General Gait Details: Pt placed rt knee on seat of rollator to maintain NWB of rt foot. Rollator with rt front wheel "sticking" making steering  difficult.   Stairs            Wheelchair Mobility    Modified Rankin (Stroke Patients Only)       Balance Overall balance assessment: Needs assistance   Sitting balance-Leahy Scale: Normal       Standing balance-Leahy Scale: Poor Standing balance comment: Extremity support needed only because of NWB of rt foot.                    Cognition Arousal/Alertness: Awake/alert Behavior During Therapy: WFL for tasks assessed/performed Overall Cognitive Status: Within Functional Limits for tasks assessed                      Exercises      General Comments        Pertinent Vitals/Pain      Home Living                      Prior Function            PT Goals (current goals can now be found in the care plan section) Progress towards PT goals: Progressing toward goals    Frequency       PT Plan Current plan remains appropriate    Co-evaluation             End of Session   Activity Tolerance: Patient tolerated treatment well Patient left: in bed;with call bell/phone within reach     Time: 1049-1100 PT Time Calculation (min) (ACUTE ONLY): 11  min  Charges:  $Gait Training: 8-22 mins                    G Codes:      Mylo Driskill July 03, 2014, 11:16 AM  [facility] PT [phone]

## 2014-06-18 NOTE — Discharge Summary (Signed)
Physician Discharge Summary  [name] [id number] DOB: Feb 10, 1971 DOA: 06/02/2014  PCP: Hayden Rasmussen., MD  Admit date: 06/02/2014 Discharge date: 06/18/2014  Time spent: greater than 30 minutes  Recommendations for Outpatient Follow-up:  1. Home with RN for wound care: silvadene and gauze daily  Discharge Diagnoses:  Principal Problem:   Cellulitis/abscess of right foot Active Problems:   Type 1 diabetes mellitus with neurological manifestations, uncontrolled   Iron deficiency anemia   CKD (chronic kidney disease), stage III   DM type 1 causing renal disease   Diabetic ulcer of right foot   AKI (acute kidney injury) Antibiotic associated diarrhea: c diff neg x2  Discharge Condition: stable  Diet recommendation: diabetic  Filed Weights   06/14/14 2259 06/15/14 2148 06/17/14 2113  Weight: 62.687 kg (138 lb 3.2 oz) 62.823 kg (138 lb 8 oz) 91.763 kg (202 lb 4.8 oz)    History of present illness:  44 y.o. female with h/o poorly controlled DM1, osteomyelitis of left foot due to diabetic foot ulcer. Patient presents with worsening blister and cellulitis of her right foot due to a diabetic foot ulcer. X ray was negative for osteomyelitis today. Pain is intermittent, sharp, stabbing, 7/10. There have been associated fever and chills over the past week or so. She has been on bactrim for her other foot for about 2 weeks now. Other foot wound is healing she states. She saw wound care doc last this morning who sent her in to the ED for evaluation.  Hospital Course:   Cellulitis/abscess of right foot: initially started on IV antibiotics. Failed to improve over the first few days, so broadened to vancomycin and zosyn and MRI done.  Dr. Sharol Given consulted. After much convincing, patient agreed to  I and D.  And supsequently skin graft and Wound vac off tomorrow. Patient  has worked extensively with PT. Pt does not meet criteria for CIR or SNF. Will go home with woundcare.  DME  arranged. Has been on 16 days IV abx. Will give another 7 days doxycycline and patient will f/u with Dr. Sharol Given as outpatient  Loose stool: c diff negative. Offered imodium. Pt refuses  Right calf pain. Doppler negative for DVT   Type 1 diabetes mellitus with neurological manifestations, uncontrolled: insulin pump continued and diabetic educator assisted with patient education and compliance   AKI on CKD (chronic kidney disease) stage 2-3- improved. Seen by nephrology during hospitazation   DM type 1 causing renal disease  Chronic anemia: continue iron, aranesp. H/h stable  Mild hyperkalemia: treated with insulin  Procedures:  i and d right foot abscess  Skin graft and wound vac to right foot wound  Consultations: Nephrology orhtopedics  Discharge Exam: Filed Vitals:   06/18/14 0814  BP: 169/92  Pulse: 76  Temp: 98.1 F (36.7 C)  Resp: 18    General: sarcastic  Cardiovascular: RRR  Respiratory: CTA Ext bandage CDI  Discharge Instructions   Discharge Instructions    Diet Carb Modified    Complete by:  As directed      Touch down weight bearing    Complete by:  As directed   Laterality:  right  Extremity:  Lower          Current Discharge Medication List    START taking these medications   Details  doxycycline (VIBRA-TABS) 100 MG tablet Take 1 tablet (100 mg total) by mouth every 12 (twelve) hours. Qty: 14 tablet, Refills: 0    loperamide (IMODIUM) 2 MG capsule  As directed for diarrhea. Over the counter. Qty: 30 capsule, Refills: 0    oxyCODONE-acetaminophen (ROXICET) 5-325 MG per tablet Take 1 tablet by mouth every 4 (four) hours as needed for severe pain. Qty: 60 tablet, Refills: 0    silver sulfADIAZINE (SILVADENE) 1 % cream Apply 1 application topically daily. Apply Silvadene plus dry dressing to right and left foot wounds change daily Qty: 400 g, Refills: 3      CONTINUE these medications which have NOT CHANGED   Details  calcitRIOL  (ROCALTROL) 0.25 MCG capsule Take 0.25 mcg by mouth. M,W,F    CALCIUM PO Take 1 tablet by mouth daily.    ferrous sulfate 325 (65 FE) MG tablet Take 325 mg by mouth 2 (two) times daily with a meal.     folic acid (FOLVITE) 1 MG tablet Take 1 mg by mouth daily.    pravastatin (PRAVACHOL) 40 MG tablet Take 0.5 tablets (20 mg total) by mouth daily. Qty: 30 tablet, Refills: 3    pyridoxine (B-6) 100 MG tablet Take 100 mg by mouth daily.    vitamin B-12 (CYANOCOBALAMIN) 100 MCG tablet Take 100 mcg by mouth daily.    albuterol (PROVENTIL HFA;VENTOLIN HFA) 108 (90 BASE) MCG/ACT inhaler Inhale 2 puffs into the lungs every 6 (six) hours as needed. S.O.B.    insulin aspart (NOVOLOG) 100 UNIT/ML injection Use max 65 units per day with pump Qty: 3 vial, Refills: 3    sodium bicarbonate 650 MG tablet Take 1 tablet (650 mg total) by mouth 3 (three) times daily. Qty: 90 tablet, Refills: 0      STOP taking these medications     sulfamethoxazole-trimethoprim (BACTRIM DS,SEPTRA DS) 800-160 MG per tablet      losartan (COZAAR) 25 MG tablet        Allergies  Allergen Reactions  . Ciprofloxacin Itching  . Cleocin [Clindamycin Hcl]     Diarrhea    Follow-up Information    Follow up with DUDA,MARCUS V, MD In 2 weeks.   Specialty:  Orthopedic Surgery   Contact information:   [place] [address] [phone]       Follow up with Hayden Rasmussen., MD.   Specialty:  Family Medicine   Contact information:   [place] [address] [phone]        The results of significant diagnostics from this hospitalization (including imaging, microbiology, ancillary and laboratory) are listed below for reference.    Significant Diagnostic Studies: Dg Chest 2 View  06/07/2014   CLINICAL DATA:  Cough, diabetes, hypertension, hypercholesterolemia, former smoker  EXAM: CHEST  2 VIEW  COMPARISON:  06/25/2013  FINDINGS: Upper normal heart size.  Mediastinal  contours and pulmonary vascularity normal.  Minimal chronic peribronchial thickening without infiltrate, pleural effusion or pneumothorax.  Bones demineralized.  IMPRESSION: Minimal chronic bronchitic changes without acute infiltrate.   Electronically Signed   By: Lavonia Dana M.D.   On: 06/07/2014 11:44   Mr Foot Right Wo Contrast  06/04/2014   CLINICAL DATA:  Partial-thickness soft tissue ulcer of the plantar aspect of the right foot under the great toe measuring 1 x 1 cm. Surrounding erythema extending over the dorsum of the foot. Cellulitis.  EXAM: MRI OF THE RIGHT FOREFOOT WITHOUT CONTRAST  TECHNIQUE: Multiplanar, multisequence MR imaging was performed. No intravenous contrast was administered.  COMPARISON:  Radiographs dated 06/02/2014  FINDINGS: There is no osteomyelitis or other significant osseous abnormality. No soft tissue abscesses. No joint effusions.  Tendons appear normal. There is a blister on the plantar aspect of the forefoot. There is a tiny area of There is probably there of ulcerations seen on image 12 series 8 at the level of the head of the first metatarsal. No underlying abscess.  Nonspecific subcutaneous edema of the forefoot.  IMPRESSION: 1. Soft tissue ulceration and prominent blister on the plantar aspect of the ball of the foot. 2. No underlying abscess, osteomyelitis, or joint infection.   Electronically Signed   By: Lorriane Shire M.D.   On: 06/04/2014 14:59   Dg Foot Complete Left  05/27/2014   CLINICAL DATA:  Nonhealing ulcer along the plantar aspect of the base of the fifth metatarsal full past 3 months, history of diabetes  EXAM: LEFT FOOT - COMPLETE 3+ VIEW  COMPARISON:  Left foot series of March 19, 2014  FINDINGS: There is diffuse osteopenia. There is no acute fracture. The interphalangeal joint spaces are narrowed diffusely. There is fusion across the PIP joint of the second toe. There are chronic changes involving the metatarsal bases of the second through fifth  metatarsal. There is no lytic lesion. There are vascular calcifications present. There are no soft tissue gas collections. There is a deep cutaneous ulcer along the plantar aspect of the base of the fifth metatarsal. There is flatfoot deformity which is stable.  IMPRESSION: There is a deep cutaneous ulcer overlying the base of the fifth metatarsal. No osteomyelitis is demonstrated. There are chronic changes of the tarsometatarsal joints. There is chronic fusion across the PIP joint of the second toe.   Electronically Signed   By: David  Martinique   On: 05/27/2014 09:33   Dg Foot Complete Right  06/02/2014   CLINICAL DATA:  Subsequent encounter for diffuse right foot pain. Symptoms are persisting for a few days.  EXAM: RIGHT FOOT COMPLETE - 3+ VIEW  COMPARISON:  03/19/2014.  FINDINGS: No evidence for an acute fracture. Deformity of the fourth toe proximal phalanx is compatible with remote trauma. No worrisome lytic or sclerotic osseous abnormality.  IMPRESSION: No acute bony findings.   Electronically Signed   By: Misty Stanley M.D.   On: 06/02/2014 15:36   Mm Digital Diagnostic Bilat  05/25/2014   CLINICAL DATA:  Right breast mass. Patient palpated this several weeks ago but does not know if it is new.  EXAM: DIGITAL DIAGNOSTIC BILATERAL MAMMOGRAM WITH CAD  ULTRASOUND LEFT BREAST  COMPARISON:  Prior exam, 12/09/2007  ACR Breast Density Category c: The breast tissue is heterogeneously dense, which may obscure small masses.  FINDINGS: There are no discrete masses, areas of architectural distortion or suspicious calcifications. There is tissue somewhat more dense in the upper outer left breast that on the right, which is stable from the prior study.  Mammographic images were processed with CAD.  On physical exam, no discrete masses palpated. Patient is reporting area of fibroglandular tissue in retroareolar and upper outer left breast.  Targeted ultrasound is performed, showing dense fibroglandular tissue on the 12  o'clock position of the left breast and retroareolar left breast throughout the upper outer lateral left breast. No discrete mass. No cyst.  IMPRESSION: Normal exam. Patient is palpated dense fibroglandular tissue throughout much of the left breast. No evidence of malignancy.  RECOMMENDATION: Screening mammogram in one year.(Code:SM-B-01Y)  I have discussed the findings and recommendations with the patient. Results were also provided in writing at the conclusion of the visit. If applicable, a reminder letter will be sent to the patient  regarding the next appointment.  BI-RADS CATEGORY  1: Negative.   Electronically Signed   By: Lajean Manes M.D.   On: 05/25/2014 15:08   US Breast Ltd Uni Left Inc Axilla  05/25/2014   CLINICAL DATA:  Right breast mass. Patient palpated this several weeks ago but does not know if it is new.  EXAM: DIGITAL DIAGNOSTIC BILATERAL MAMMOGRAM WITH CAD  ULTRASOUND LEFT BREAST  COMPARISON:  Prior exam, 12/09/2007  ACR Breast Density Category c: The breast tissue is heterogeneously dense, which may obscure small masses.  FINDINGS: There are no discrete masses, areas of architectural distortion or suspicious calcifications. There is tissue somewhat more dense in the upper outer left breast that on the right, which is stable from the prior study.  Mammographic images were processed with CAD.  On physical exam, no discrete masses palpated. Patient is reporting area of fibroglandular tissue in retroareolar and upper outer left breast.  Targeted ultrasound is performed, showing dense fibroglandular tissue on the 12 o'clock position of the left breast and retroareolar left breast throughout the upper outer lateral left breast. No discrete mass. No cyst.  IMPRESSION: Normal exam. Patient is palpated dense fibroglandular tissue throughout much of the left breast. No evidence of malignancy.  RECOMMENDATION: Screening mammogram in one year.(Code:SM-B-01Y)  I have discussed the findings and  recommendations with the patient. Results were also provided in writing at the conclusion of the visit. If applicable, a reminder letter will be sent to the patient regarding the next appointment.  BI-RADS CATEGORY  1: Negative.   Electronically Signed   By: Lajean Manes M.D.   On: 05/25/2014 15:08    Microbiology: Recent Results (from the past 240 hour(s))  Clostridium Difficile by PCR     Status: None   Collection Time: 06/10/14 12:10 AM  Result Value Ref Range Status   C difficile by pcr NEGATIVE NEGATIVE Final  Tissue culture     Status: None   Collection Time: 06/10/14  7:56 PM  Result Value Ref Range Status   Specimen Description TISSUE FOOT RIGHT  Final   Special Requests PATIENT ON FOLLOWING PIPERACILLEN  Final   Gram Stain   Final    FEW WBC PRESENT,BOTH PMN AND MONONUCLEAR NO SQUAMOUS EPITHELIAL CELLS SEEN NO ORGANISMS SEEN Performed at [facility]    Culture   Final    FEW CANDIDA ALBICANS Performed at [facility]    Report Status 06/14/2014 FINAL  Final     Labs: Basic Metabolic Panel:  Recent Labs Lab 06/13/14 0440 06/14/14 0607 06/15/14 0545 06/17/14 0635 06/18/14 0529  NA 136 136 138 137 138  K 4.2 4.8 4.7 5.2* 5.3*  CL 102 104 105 107 109  CO2 25 24 23  18* 21  GLUCOSE 307* 180* 264* 152* 254*  BUN 26* 29* 24* 26* 32*  CREATININE 2.98* 2.49* 2.27* 2.37* 2.66*  CALCIUM 8.1* 8.2* 8.3* 8.6 8.6   Liver Function Tests: No results for input(s): AST, ALT, ALKPHOS, BILITOT, PROT, ALBUMIN in the last 168 hours. No results for input(s): LIPASE, AMYLASE in the last 168 hours. No results for input(s): AMMONIA in the last 168 hours. CBC:  Recent Labs Lab 06/15/14 0545 06/18/14 0529  WBC 11.2* 10.2  HGB 7.3* 7.8*  HCT 24.3* 26.2*  MCV 82.1 84.0  PLT 826* 868*   Cardiac Enzymes: No results for input(s): CKTOTAL, CKMB, CKMBINDEX, TROPONINI in the last 168 hours. BNP: BNP (last 3 results) No results for input(s): BNP in  the last 8760  hours.  ProBNP (last 3 results) No results for input(s): PROBNP in the last 8760 hours.  CBG:  Recent Labs Lab 06/17/14 1259 06/17/14 1656 06/17/14 2108 06/18/14 0159 06/18/14 0810  GLUCAP 123* 112* 172* 245* 313*       Signed:  Handy Mcloud L  [facility] 06/18/2014, 11:41 AM

## 2014-06-23 DIAGNOSIS — E10621 Type 1 diabetes mellitus with foot ulcer: Secondary | ICD-10-CM | POA: Diagnosis present

## 2014-06-23 DIAGNOSIS — L97422 Non-pressure chronic ulcer of left heel and midfoot with fat layer exposed: Secondary | ICD-10-CM | POA: Diagnosis not present

## 2014-06-23 DIAGNOSIS — L97411 Non-pressure chronic ulcer of right heel and midfoot limited to breakdown of skin: Secondary | ICD-10-CM | POA: Diagnosis not present

## 2014-06-30 DIAGNOSIS — E10621 Type 1 diabetes mellitus with foot ulcer: Secondary | ICD-10-CM | POA: Diagnosis not present

## 2014-07-05 ENCOUNTER — Other Ambulatory Visit: Payer: Self-pay | Admitting: *Deleted

## 2014-07-05 ENCOUNTER — Other Ambulatory Visit (INDEPENDENT_AMBULATORY_CARE_PROVIDER_SITE_OTHER): Payer: Medicaid Other

## 2014-07-05 DIAGNOSIS — E1049 Type 1 diabetes mellitus with other diabetic neurological complication: Secondary | ICD-10-CM

## 2014-07-05 DIAGNOSIS — E1041 Type 1 diabetes mellitus with diabetic mononeuropathy: Secondary | ICD-10-CM

## 2014-07-05 DIAGNOSIS — IMO0002 Reserved for concepts with insufficient information to code with codable children: Secondary | ICD-10-CM

## 2014-07-05 DIAGNOSIS — E1065 Type 1 diabetes mellitus with hyperglycemia: Principal | ICD-10-CM

## 2014-07-05 LAB — LIPID PANEL
CHOLESTEROL: 208 mg/dL — AB (ref 0–200)
HDL: 26.1 mg/dL — ABNORMAL LOW (ref >39.00)
NonHDL: 181.9
TRIGLYCERIDES: 258 mg/dL — AB (ref 0.0–149.0)
Total CHOL/HDL Ratio: 8
VLDL: 51.6 mg/dL — ABNORMAL HIGH (ref 0.0–40.0)

## 2014-07-05 LAB — COMPREHENSIVE METABOLIC PANEL
ALT: 37 U/L — ABNORMAL HIGH (ref 0–35)
AST: 32 U/L (ref 0–37)
Albumin: 3.3 g/dL — ABNORMAL LOW (ref 3.5–5.2)
Alkaline Phosphatase: 176 U/L — ABNORMAL HIGH (ref 39–117)
BILIRUBIN TOTAL: 0.2 mg/dL (ref 0.2–1.2)
BUN: 41 mg/dL — ABNORMAL HIGH (ref 6–23)
CALCIUM: 9.2 mg/dL (ref 8.4–10.5)
CHLORIDE: 104 meq/L (ref 96–112)
CO2: 23 meq/L (ref 19–32)
CREATININE: 1.87 mg/dL — AB (ref 0.40–1.20)
GFR: 31.29 mL/min — AB (ref >60.00)
GLUCOSE: 187 mg/dL — AB (ref 70–99)
Potassium: 5.3 mEq/L — ABNORMAL HIGH (ref 3.5–5.1)
Sodium: 132 mEq/L — ABNORMAL LOW (ref 135–145)
Total Protein: 7.4 g/dL (ref 6.0–8.3)

## 2014-07-05 LAB — HEMOGLOBIN A1C: Hgb A1c MFr Bld: 9.8 % — ABNORMAL HIGH (ref 4.6–6.5)

## 2014-07-06 LAB — LDL CHOLESTEROL, DIRECT: Direct LDL: 112 mg/dL

## 2014-07-07 ENCOUNTER — Encounter (HOSPITAL_BASED_OUTPATIENT_CLINIC_OR_DEPARTMENT_OTHER): Payer: Medicaid Other | Attending: Surgery

## 2014-07-07 ENCOUNTER — Ambulatory Visit: Payer: Medicaid Other | Admitting: Endocrinology

## 2014-07-07 DIAGNOSIS — L97413 Non-pressure chronic ulcer of right heel and midfoot with necrosis of muscle: Secondary | ICD-10-CM | POA: Insufficient documentation

## 2014-07-07 DIAGNOSIS — L97421 Non-pressure chronic ulcer of left heel and midfoot limited to breakdown of skin: Secondary | ICD-10-CM | POA: Diagnosis present

## 2014-07-07 DIAGNOSIS — Z9889 Other specified postprocedural states: Secondary | ICD-10-CM | POA: Insufficient documentation

## 2014-07-07 DIAGNOSIS — Z794 Long term (current) use of insulin: Secondary | ICD-10-CM | POA: Insufficient documentation

## 2014-07-07 DIAGNOSIS — E11621 Type 2 diabetes mellitus with foot ulcer: Secondary | ICD-10-CM | POA: Diagnosis present

## 2014-07-14 DIAGNOSIS — E11621 Type 2 diabetes mellitus with foot ulcer: Secondary | ICD-10-CM | POA: Diagnosis not present

## 2014-07-16 ENCOUNTER — Ambulatory Visit (INDEPENDENT_AMBULATORY_CARE_PROVIDER_SITE_OTHER): Payer: Medicaid Other | Admitting: Endocrinology

## 2014-07-16 ENCOUNTER — Encounter: Payer: Self-pay | Admitting: Endocrinology

## 2014-07-16 VITALS — BP 146/90 | HR 98 | Temp 98.5°F | Resp 14 | Ht 68.0 in | Wt 132.6 lb

## 2014-07-16 DIAGNOSIS — E1049 Type 1 diabetes mellitus with other diabetic neurological complication: Secondary | ICD-10-CM

## 2014-07-16 DIAGNOSIS — E7800 Pure hypercholesterolemia, unspecified: Secondary | ICD-10-CM

## 2014-07-16 DIAGNOSIS — L97509 Non-pressure chronic ulcer of other part of unspecified foot with unspecified severity: Secondary | ICD-10-CM

## 2014-07-16 DIAGNOSIS — E1121 Type 2 diabetes mellitus with diabetic nephropathy: Secondary | ICD-10-CM

## 2014-07-16 DIAGNOSIS — IMO0002 Reserved for concepts with insufficient information to code with codable children: Secondary | ICD-10-CM

## 2014-07-16 DIAGNOSIS — E1041 Type 1 diabetes mellitus with diabetic mononeuropathy: Secondary | ICD-10-CM

## 2014-07-16 DIAGNOSIS — E13621 Other specified diabetes mellitus with foot ulcer: Secondary | ICD-10-CM | POA: Diagnosis not present

## 2014-07-16 DIAGNOSIS — E1065 Type 1 diabetes mellitus with hyperglycemia: Principal | ICD-10-CM

## 2014-07-16 NOTE — Progress Notes (Signed)
Patient ID: [name], female   DOB: 01-09-1971, 44 y.o.   MRN: [id number]   Reason for Appointment: Insulin pump follow-up   History of Present Illness   Diagnosis: Type 1 DIABETES MELITUS  Previous history: She has had long-standing poorly controlled diabetes and typically has poor compliance with self care measures despite periodic diabetes education and periodic followup  INSULIN PUMP regimen:  Basal rate: Midnight = 0.325, 9 AM = 0.4, 12 noon = 0.5,5 PM = 0.6. Total basal insulin 11units daily  Carb coverage 1: 18 g and correction 1: 90 with target 150, active insulin 4 hours         Recent history:  She has again persistently poor control Her basal rates were increased at 6 AM on her last visit but because of occasional low sugars around 6 PM the evening basal rate after 5 PM was reduced slightly Current blood sugar patterns, difficulties with management and problems identified:  Her blood sugars are still mostly elevated   Again  she has difficulty with compliance in following instructions for proper self-care  She claims that her blood sugar monitor his upstairs in her bedroom and she does not always bolus when she is eating her first meal or in the late evening.  She has markedly increased blood sugars late at night starting at least 9 PM from uncovered food and drinks  She is eating some snacks late at night and drinking a lot of juice also without taking bolus  She is a little more regular recently in taking boluses around her late afternoon meal around 4-5 PM; however does not check blood sugar at that time and does not also checked postprandial readings  If her blood sugar is high she may not always take a bolus to do a correction  Also does not do a follow-up bolus for high readings even when they are significantly high  She has several blood sugars over 400 between 11 PM-3 AM  Her glucose is averaging 370 compared to 258 previously  A1c is  still about same  She thinks her blood sugars were much better in the hospital when she was able to be better compliant with meals and boluses as well as with glucose monitoring Current blood sugar results from pump download:  PRE-MEAL  8-11 AM   11 AM-2 PM   6 PM-mnt   1 A-4 AM  Overall  Glucose range:  251-359   78-334   249-400+   250-400+    Mean/median:    351    371+/-149    Meals: eating at variable times and also having snacks periodically.  Her daily carbohydrate intake is quite variable Physical activity: exercise: none         Dietician visit: Most recent:?          Complications: are: Neuropathy, nephropathy, diabetic foot ulcer      Wt Readings from Last 3 Encounters:  07/16/14 132 lb 9.6 oz (60.147 kg)  06/17/14 202 lb 4.8 oz (91.763 kg)  05/27/14 133 lb (60.328 kg)   Lab Results  Component Value Date   HGBA1C 9.8* 07/05/2014   HGBA1C 10.0* 05/27/2014   HGBA1C 12.4* 01/06/2014   Lab Results  Component Value Date   MICROALBUR 108.7* 04/08/2013   LDLCALC 128* 07/31/2013   CREATININE 1.87* 07/05/2014        Medication List       This list is accurate as of: 07/16/14 11:59 PM.  Always  use your most recent med list.               albuterol 108 (90 BASE) MCG/ACT inhaler  Commonly known as:  PROVENTIL HFA;VENTOLIN HFA  Inhale 2 puffs into the lungs every 6 (six) hours as needed. S.O.B.     calcitRIOL 0.25 MCG capsule  Commonly known as:  ROCALTROL  Take 0.25 mcg by mouth. M,W,F     CALCIUM PO  Take 1 tablet by mouth daily.     doxycycline 100 MG tablet  Commonly known as:  VIBRA-TABS  Take 1 tablet (100 mg total) by mouth every 12 (twelve) hours.     ferrous sulfate 325 (65 FE) MG tablet  Take 325 mg by mouth 2 (two) times daily with a meal.     folic acid 1 MG tablet  Commonly known as:  FOLVITE  Take 1 mg by mouth daily.     insulin aspart 100 UNIT/ML injection  Commonly known as:  novoLOG  Use max 65 units per day with pump      loperamide 2 MG capsule  Commonly known as:  IMODIUM  As directed for diarrhea. Over the counter.     oxyCODONE-acetaminophen 5-325 MG per tablet  Commonly known as:  ROXICET  Take 1 tablet by mouth every 4 (four) hours as needed for severe pain.     pravastatin 40 MG tablet  Commonly known as:  PRAVACHOL  Take 0.5 tablets (20 mg total) by mouth daily.     pyridoxine 100 MG tablet  Commonly known as:  B-6  Take 100 mg by mouth daily.     silver sulfADIAZINE 1 % cream  Commonly known as:  SILVADENE  Apply 1 application topically daily. Apply Silvadene plus dry dressing to right and left foot wounds change daily     sodium bicarbonate 650 MG tablet  Take 1 tablet (650 mg total) by mouth 3 (three) times daily.     vitamin B-12 100 MCG tablet  Commonly known as:  CYANOCOBALAMIN  Take 100 mcg by mouth daily.        Allergies:  Allergies  Allergen Reactions  . Ciprofloxacin Itching  . Cleocin [Clindamycin Hcl]     Diarrhea     Past Medical History  Diagnosis Date  . Diabetes mellitus   . Neuropathy in diabetes   . Hypertension   . Hypercholesteremia   . H/O seasonal allergies   . Anemia   . Left foot infection     Past Surgical History  Procedure Laterality Date  . Cesarean section    . Eye surgery    . Hemrrhoidectomy    . Peripherally inserted central catheter insertion    . I&d extremity Right 06/10/2014    Procedure: IRRIGATION AND DEBRIDEMENT Right Foot;  Surgeon: Newt Minion, MD;  Location: [facility];  Service: Orthopedics;  Laterality: Right;  . Skin split graft Right 06/15/2014    Procedure: SPLIT THICKNESS SKIN GRAFT RIGHT FOOT;  Surgeon: Newt Minion, MD;  Location: [facility];  Service: Orthopedics;  Laterality: Right;    No family history on file.  Social History:  reports that she quit smoking about 6 years ago. She has never used smokeless tobacco. She reports that she drinks about 0.5 oz of alcohol per week. She reports that she does not use illicit  drugs.  Review of Systems:  Hypertension:  Her lisinopril and losartan had been stopped because of hyperkalemia and higher creatinine levels. Continues to have  mildly increased potassium levels  Lipids: Has been  prescribed pravastatin but she does not take this, she wants to avoid taking medications  Lab Results  Component Value Date   CHOL 208* 07/05/2014   HDL 26.10* 07/05/2014   LDLCALC 128* 07/31/2013   LDLDIRECT 112.0 07/05/2014   TRIG 258.0* 07/05/2014   CHOLHDL 8 07/05/2014     She has been treated by nephrologist for anemia and was given iron infusion in the hospital for severe anemia  Lab Results  Component Value Date   WBC 10.2 06/18/2014   HGB 7.8* 06/18/2014   HCT 26.2* 06/18/2014   MCV 84.0 06/18/2014   PLT 868* 06/18/2014   She has seen the wound clinic for bilateral foot ulcers and had a skin graft for the right foot ulcer   Examination:   BP 146/90 mmHg  Pulse 98  Temp(Src) 98.5 F (36.9 C)  Resp 14  Ht 5\' 8"  (1.727 m)  Wt 132 lb 9.6 oz (60.147 kg)  BMI 20.17 kg/m2  SpO2 99%  Body mass index is 20.17 kg/(m^2).   Feet not examined due to her having bandages but her cell phone photographs show large open wound on the right plantar surfaces of the foot and nonhealing ulcer on the left midfoot, no obvious cellulitis seen on the portal graft  ASSESSMENT/ PLAN:   Diabetes type 1 with poor control and multiple complications    Blood glucose levels  are still poorly controlled  See history of present illness for details of her blood sugar patterns, current management and problems identified. Blood sugars are persistently high and only occasionally relatively better around 1-2 PM Most of her hyperglycemia is related to poor diet, not bolusing when she needs to and infrequent monitoring Explained to her that her high fasting readings are partly related to inadequate basal insulin overnight and will need to increase those basal rates  DIABETIC nephropathy:  Followed by nephrologist Since she has nephrotic syndrome not clear if there is any role in low protein diet and this was discussed  Diabetic foot ulcer: She wants another opinion from a podiatrist and this was done Stressed the importance of better diabetes control  PLAN:  Basal rates as follows: Midnight = 0.4, 12 noon = 0.5, 5 PM = 0.6 and 10 PM = 0.65 Sensitivity 1:70  More consistent follow-up  More frequent glucose monitoring, new glucose monitor was given so she can have a monitoring 2 different locations at home Also discussed need to check blood sugar every time she is eating and also some readings 2 hours after meals She was advised to check more readings after doing a correction bolus also to help adjust her correction factor Have protein with every meal and avoid juices Followup in 4 weeks  Counseling time over 50% of today's 25 minute visit  Illias Pantano 07/17/2014, 4:58 PM

## 2014-07-16 NOTE — Patient Instructions (Signed)
Check sugar 4x daily 

## 2014-07-20 DIAGNOSIS — L97421 Non-pressure chronic ulcer of left heel and midfoot limited to breakdown of skin: Secondary | ICD-10-CM | POA: Diagnosis not present

## 2014-07-20 DIAGNOSIS — Z794 Long term (current) use of insulin: Secondary | ICD-10-CM | POA: Diagnosis not present

## 2014-07-20 DIAGNOSIS — E11621 Type 2 diabetes mellitus with foot ulcer: Secondary | ICD-10-CM | POA: Diagnosis not present

## 2014-07-20 DIAGNOSIS — L97413 Non-pressure chronic ulcer of right heel and midfoot with necrosis of muscle: Secondary | ICD-10-CM | POA: Diagnosis not present

## 2014-07-21 ENCOUNTER — Encounter (HOSPITAL_COMMUNITY): Payer: Self-pay

## 2014-07-21 ENCOUNTER — Encounter: Payer: Self-pay | Admitting: Podiatry

## 2014-07-21 ENCOUNTER — Encounter (HOSPITAL_COMMUNITY)
Admission: RE | Admit: 2014-07-21 | Discharge: 2014-07-21 | Disposition: A | Discharge status: Home / Self Care | Payer: Medicaid Other | Source: Ambulatory Visit | Attending: Nephrology | Admitting: Nephrology

## 2014-07-21 ENCOUNTER — Ambulatory Visit (INDEPENDENT_AMBULATORY_CARE_PROVIDER_SITE_OTHER): Payer: Medicaid Other | Admitting: Podiatry

## 2014-07-21 VITALS — BP 138/75 | HR 86 | Temp 98.0°F | Resp 12

## 2014-07-21 DIAGNOSIS — N183 Chronic kidney disease, stage 3 unspecified: Secondary | ICD-10-CM

## 2014-07-21 DIAGNOSIS — E08621 Diabetes mellitus due to underlying condition with foot ulcer: Secondary | ICD-10-CM | POA: Diagnosis not present

## 2014-07-21 DIAGNOSIS — L97519 Non-pressure chronic ulcer of other part of right foot with unspecified severity: Secondary | ICD-10-CM | POA: Diagnosis not present

## 2014-07-21 DIAGNOSIS — D631 Anemia in chronic kidney disease: Secondary | ICD-10-CM | POA: Diagnosis not present

## 2014-07-21 DIAGNOSIS — N184 Chronic kidney disease, stage 4 (severe): Secondary | ICD-10-CM | POA: Insufficient documentation

## 2014-07-21 LAB — IRON AND TIBC
IRON: 43 ug/dL (ref 42–145)
SATURATION RATIOS: 19 % — AB (ref 20–55)
TIBC: 232 ug/dL — AB (ref 250–470)
UIBC: 189 ug/dL (ref 125–400)

## 2014-07-21 LAB — HEMOGLOBIN: Hemoglobin: 12.9 g/dL (ref 12.0–15.0)

## 2014-07-21 LAB — FERRITIN: FERRITIN: 191 ng/mL (ref 10–291)

## 2014-07-21 NOTE — Discharge Instructions (Signed)
Epoetin Alfa injection °What is this medicine? °EPOETIN ALFA (e POE e tin AL fa) helps your body make more red blood cells. This medicine is used to treat anemia caused by chronic kidney failure, cancer chemotherapy, or HIV-therapy. It may also be used before surgery if you have anemia. °This medicine may be used for other purposes; ask your health care provider or pharmacist if you have questions. °COMMON BRAND NAME(S): Epogen, Procrit °What should I tell my health care provider before I take this medicine? °They need to know if you have any of these conditions: °-blood clotting disorders °-cancer patient not on chemotherapy °-cystic fibrosis °-heart disease, such as angina or heart failure °-hemoglobin level of 12 g/dL or greater °-high blood pressure °-low levels of folate, iron, or vitamin B12 °-seizures °-an unusual or allergic reaction to erythropoietin, albumin, benzyl alcohol, hamster proteins, other medicines, foods, dyes, or preservatives °-pregnant or trying to get pregnant °-breast-feeding °How should I use this medicine? °This medicine is for injection into a vein or under the skin. It is usually given by a health care professional in a hospital or clinic setting. °If you get this medicine at home, you will be taught how to prepare and give this medicine. Use exactly as directed. Take your medicine at regular intervals. Do not take your medicine more often than directed. °It is important that you put your used needles and syringes in a special sharps container. Do not put them in a trash can. If you do not have a sharps container, call your pharmacist or healthcare provider to get one. °Talk to your pediatrician regarding the use of this medicine in children. While this drug may be prescribed for selected conditions, precautions do apply. °Overdosage: If you think you have taken too much of this medicine contact a poison control center or emergency room at once. °NOTE: This medicine is only for you. Do  not share this medicine with others. °What if I miss a dose? °If you miss a dose, take it as soon as you can. If it is almost time for your next dose, take only that dose. Do not take double or extra doses. °What may interact with this medicine? °Do not take this medicine with any of the following medications: °-darbepoetin alfa °This list may not describe all possible interactions. Give your health care provider a list of all the medicines, herbs, non-prescription drugs, or dietary supplements you use. Also tell them if you smoke, drink alcohol, or use illegal drugs. Some items may interact with your medicine. °What should I watch for while using this medicine? °Visit your prescriber or health care professional for regular checks on your progress and for the needed blood tests and blood pressure measurements. It is especially important for the doctor to make sure your hemoglobin level is in the desired range, to limit the risk of potential side effects and to give you the best benefit. Keep all appointments for any recommended tests. Check your blood pressure as directed. Ask your doctor what your blood pressure should be and when you should contact him or her. °As your body makes more red blood cells, you may need to take iron, folic acid, or vitamin B supplements. Ask your doctor or health care provider which products are right for you. If you have kidney disease continue dietary restrictions, even though this medication can make you feel better. Talk with your doctor or health care professional about the foods you eat and the vitamins that you take. °What   side effects may I notice from receiving this medicine? °Side effects that you should report to your doctor or health care professional as soon as possible: °-allergic reactions like skin rash, itching or hives, swelling of the face, lips, or tongue °-breathing problems °-changes in vision °-chest pain °-confusion, trouble speaking or understanding °-feeling  faint or lightheaded, falls °-high blood pressure °-muscle aches or pains °-pain, swelling, warmth in the leg °-rapid weight gain °-severe headaches °-sudden numbness or weakness of the face, arm or leg °-trouble walking, dizziness, loss of balance or coordination °-seizures (convulsions) °-swelling of the ankles, feet, hands °-unusually weak or tired °Side effects that usually do not require medical attention (report to your doctor or health care professional if they continue or are bothersome): °-diarrhea °-fever, chills (flu-like symptoms) °-headaches °-nausea, vomiting °-redness, stinging, or swelling at site where injected °This list may not describe all possible side effects. Call your doctor for medical advice about side effects. You may report side effects to FDA at 1-800-FDA-1088. °Where should I keep my medicine? °Keep out of the reach of children. °Store in a refrigerator between 2 and 8 degrees C (36 and 46 degrees F). Do not freeze or shake. Throw away any unused portion if using a single-dose vial. Multi-dose vials can be kept in the refrigerator for up to 21 days after the initial dose. Throw away unused medicine. °NOTE: This sheet is a summary. It may not cover all possible information. If you have questions about this medicine, talk to your doctor, pharmacist, or health care provider. °© 2015, [employer]. (2008-03-02 10:25:44) ° °

## 2014-07-21 NOTE — Patient Instructions (Signed)
I recommend you continue treatment at the wound care center Apply Santyl ointment twice a day to the skin ulcer on the right foot For additional opinion concerning the diabetic ulcer on the right foot consult with Dr. Erline Hau [facility]

## 2014-07-21 NOTE — Progress Notes (Signed)
Did not receive procrit today, hgb 12.9, per orders.  Hold if greater than or = 12.  Pt returns in one week for another appointment.

## 2014-07-21 NOTE — Progress Notes (Unsigned)
[facility]  NAME:  [name], [name] NO.:  [id number]  MEDICAL RECORD NO.:  [id number]      DATE OF BIRTH:  April 24, 1970  PHYSICIAN:  Elesa Hacker, M.D.         VISIT DATE:  07/20/2014                                  OFFICE VISIT   To Whom It May Concern:  [name] is a diabetic patient who, 2 weeks ago, underwent radical debridement of a right foot including the chronic tendon and fascia.  She now has a 10 x 5 cm wound on the plantar surface of her foot with somewhat healthy-appearing tissue still present.  She is a stage III diabetic foot ulcer patient and hyperbaric oxygen is indicated for this condition.     Elesa Hacker, M.D.     RA/MEDQ  D:  07/20/2014  T:  07/21/2014  Job:  [id number]

## 2014-07-21 NOTE — Progress Notes (Signed)
   Subjective:    Patient ID: [name], female    DOB: 11/19/1970, 44 y.o.   MRN: [id number]  HPI   N-NUMBNESS, SWOLLEN, DRAINING L-RT BOTTOM FOOT D-2 MONTHS O-SUDDENLY C-WORSE A-PRESSURE T-PADDING, CREAM  N-NUMBNESS, DRAINING L-LT FOOT BOTTOM FEET D-5 MONTHS C-BETTER A-PRESSURE T-COLIGEN   This patient presents for a third opinion concerning a diabetic foot ulcer on the right foot present for approximately 2 months with a history debridement in the past week. Since the debridement she is complaining of generalized numbness in the right foot.. She has had care by Dr. Sharol Given including recent debridement of the plantar ulcer in the right foot. She is currently applying Santyl to the wound on the right foot and taking doxycycline. She also has had a second opinion with Dr. Doran Durand who agreed with Dr. Sharol Given and recommended continuing treatment at the wound care center. Patient is looking for a third opinion concerning her diabetic wound. The plantar left foot wound is being treated with collagen at the wound care center  Patient describes a history of recurrent diabetic foot ulcers that have cleared in the past with hyperbaric treatment  Review of Systems  Eyes: Positive for visual disturbance.  Cardiovascular: Positive for leg swelling.  Genitourinary: Positive for frequency.  Musculoskeletal: Positive for joint swelling and gait problem.  Skin: Positive for color change.  Neurological: Positive for numbness.  Hematological: Bruises/bleeds easily.  All other systems reviewed and are negative.      Objective:   Physical Exam Orientated 3 patient presents with her friend who is in the treatment room today  Vascular: DP pulses trace palpable bilaterally PT pulses trace palpable bilaterally Mild nonpitting edema right foot  Neurological: Ankle reflexes equal and reactive bilaterally Vibratory sensation nonreactive right reactive left Sensation to 10 g monofilament  wire intact 1/5 right and 3/5 left  Dermatological: The plantar right foot has a large oval-shaped wound measuring 8 cm x 3.5 cm. The central aspect the wound has granular tissue in the margins have some fibrous tissue. There is no active drainage, malodor, warmth noted from this wound. There is a low-grade erythema about the margins of the wound. The dorsal as the right foot demonstrates no erythema. There is no edema in the right lower extremity or erythema  The plantar left foot has a 1.5 cm superficial wound with a granular base. There is no erythema, edema surrounding the wound  Musculoskeletal: Charcot's deformity left       Assessment & Plan:  Assessment: Diabetic peripheral neuropathy Diminished pedal pulses may be suggestive peripheral arterial disease Diabetic.Ulceration without obvious sign of infection with a fibrous base  Charcot's deformity left Superficial plantar ulcer left  Plan: I'm recommending patient continue on with a wound care center and I suggested she apply the Santyl twice a day to the plantar wound right. I also made aware if she wanted an additional opinion for treatment options I gave her the name of Dr. Erline Hau of [facility] associated with [facility].

## 2014-07-24 ENCOUNTER — Other Ambulatory Visit: Payer: Self-pay | Admitting: Endocrinology

## 2014-07-27 DIAGNOSIS — L97413 Non-pressure chronic ulcer of right heel and midfoot with necrosis of muscle: Secondary | ICD-10-CM | POA: Diagnosis not present

## 2014-07-27 DIAGNOSIS — L97421 Non-pressure chronic ulcer of left heel and midfoot limited to breakdown of skin: Secondary | ICD-10-CM | POA: Diagnosis not present

## 2014-07-27 DIAGNOSIS — Z794 Long term (current) use of insulin: Secondary | ICD-10-CM | POA: Diagnosis not present

## 2014-07-27 DIAGNOSIS — E11621 Type 2 diabetes mellitus with foot ulcer: Secondary | ICD-10-CM | POA: Diagnosis not present

## 2014-07-28 ENCOUNTER — Encounter (HOSPITAL_COMMUNITY): Payer: Self-pay

## 2014-07-28 ENCOUNTER — Encounter (HOSPITAL_COMMUNITY)
Admission: RE | Admit: 2014-07-28 | Discharge: 2014-07-28 | Disposition: A | Discharge status: Home / Self Care | Payer: Medicaid Other | Source: Ambulatory Visit | Attending: Nephrology | Admitting: Nephrology

## 2014-07-28 DIAGNOSIS — N184 Chronic kidney disease, stage 4 (severe): Secondary | ICD-10-CM

## 2014-07-28 DIAGNOSIS — N183 Chronic kidney disease, stage 3 unspecified: Secondary | ICD-10-CM

## 2014-07-28 LAB — HEMOGLOBIN: Hemoglobin: 11.8 g/dL — ABNORMAL LOW (ref 12.0–15.0)

## 2014-07-28 MED ORDER — EPOETIN ALFA 10000 UNIT/ML IJ SOLN
5000.0000 [IU] | INTRAMUSCULAR | Status: DC
  Administered 2014-07-28: 5000 [IU] via SUBCUTANEOUS
  Filled 2014-07-28: qty 1

## 2014-08-02 ENCOUNTER — Encounter (HOSPITAL_BASED_OUTPATIENT_CLINIC_OR_DEPARTMENT_OTHER): Payer: Medicaid Other | Attending: General Surgery

## 2014-08-02 DIAGNOSIS — L97412 Non-pressure chronic ulcer of right heel and midfoot with fat layer exposed: Secondary | ICD-10-CM | POA: Diagnosis not present

## 2014-08-02 DIAGNOSIS — Z794 Long term (current) use of insulin: Secondary | ICD-10-CM | POA: Diagnosis not present

## 2014-08-02 DIAGNOSIS — L97421 Non-pressure chronic ulcer of left heel and midfoot limited to breakdown of skin: Secondary | ICD-10-CM | POA: Diagnosis not present

## 2014-08-02 DIAGNOSIS — E10621 Type 1 diabetes mellitus with foot ulcer: Secondary | ICD-10-CM | POA: Insufficient documentation

## 2014-08-02 DIAGNOSIS — E1021 Type 1 diabetes mellitus with diabetic nephropathy: Secondary | ICD-10-CM | POA: Insufficient documentation

## 2014-08-02 DIAGNOSIS — R609 Edema, unspecified: Secondary | ICD-10-CM | POA: Diagnosis not present

## 2014-08-02 LAB — GLUCOSE, CAPILLARY
GLUCOSE-CAPILLARY: 159 mg/dL — AB (ref 70–99)
Glucose-Capillary: 168 mg/dL — ABNORMAL HIGH (ref 70–99)

## 2014-08-03 DIAGNOSIS — R609 Edema, unspecified: Secondary | ICD-10-CM | POA: Diagnosis not present

## 2014-08-03 DIAGNOSIS — L97412 Non-pressure chronic ulcer of right heel and midfoot with fat layer exposed: Secondary | ICD-10-CM | POA: Diagnosis not present

## 2014-08-03 DIAGNOSIS — L97421 Non-pressure chronic ulcer of left heel and midfoot limited to breakdown of skin: Secondary | ICD-10-CM | POA: Diagnosis not present

## 2014-08-03 DIAGNOSIS — E10621 Type 1 diabetes mellitus with foot ulcer: Secondary | ICD-10-CM | POA: Diagnosis not present

## 2014-08-03 LAB — GLUCOSE, CAPILLARY
GLUCOSE-CAPILLARY: 147 mg/dL — AB (ref 70–99)
GLUCOSE-CAPILLARY: 190 mg/dL — AB (ref 70–99)

## 2014-08-04 ENCOUNTER — Encounter (HOSPITAL_COMMUNITY)
Admission: RE | Admit: 2014-08-04 | Discharge: 2014-08-04 | Disposition: A | Discharge status: Home / Self Care | Payer: Medicaid Other | Source: Ambulatory Visit | Attending: Nephrology | Admitting: Nephrology

## 2014-08-04 ENCOUNTER — Encounter (HOSPITAL_COMMUNITY): Payer: Self-pay

## 2014-08-04 DIAGNOSIS — N184 Chronic kidney disease, stage 4 (severe): Secondary | ICD-10-CM | POA: Diagnosis present

## 2014-08-04 DIAGNOSIS — E10621 Type 1 diabetes mellitus with foot ulcer: Secondary | ICD-10-CM | POA: Diagnosis not present

## 2014-08-04 DIAGNOSIS — N183 Chronic kidney disease, stage 3 unspecified: Secondary | ICD-10-CM

## 2014-08-04 DIAGNOSIS — D631 Anemia in chronic kidney disease: Secondary | ICD-10-CM | POA: Diagnosis not present

## 2014-08-04 LAB — GLUCOSE, CAPILLARY
Glucose-Capillary: 194 mg/dL — ABNORMAL HIGH (ref 70–99)
Glucose-Capillary: 203 mg/dL — ABNORMAL HIGH (ref 70–99)

## 2014-08-04 LAB — HEMOGLOBIN: HEMOGLOBIN: 12.2 g/dL (ref 12.0–15.0)

## 2014-08-04 NOTE — Discharge Instructions (Signed)
Epoetin Alfa injection °What is this medicine? °EPOETIN ALFA (e POE e tin AL fa) helps your body make more red blood cells. This medicine is used to treat anemia caused by chronic kidney failure, cancer chemotherapy, or HIV-therapy. It may also be used before surgery if you have anemia. °This medicine may be used for other purposes; ask your health care provider or pharmacist if you have questions. °COMMON BRAND NAME(S): Epogen, Procrit °What should I tell my health care provider before I take this medicine? °They need to know if you have any of these conditions: °-blood clotting disorders °-cancer patient not on chemotherapy °-cystic fibrosis °-heart disease, such as angina or heart failure °-hemoglobin level of 12 g/dL or greater °-high blood pressure °-low levels of folate, iron, or vitamin B12 °-seizures °-an unusual or allergic reaction to erythropoietin, albumin, benzyl alcohol, hamster proteins, other medicines, foods, dyes, or preservatives °-pregnant or trying to get pregnant °-breast-feeding °How should I use this medicine? °This medicine is for injection into a vein or under the skin. It is usually given by a health care professional in a hospital or clinic setting. °If you get this medicine at home, you will be taught how to prepare and give this medicine. Use exactly as directed. Take your medicine at regular intervals. Do not take your medicine more often than directed. °It is important that you put your used needles and syringes in a special sharps container. Do not put them in a trash can. If you do not have a sharps container, call your pharmacist or healthcare provider to get one. °Talk to your pediatrician regarding the use of this medicine in children. While this drug may be prescribed for selected conditions, precautions do apply. °Overdosage: If you think you have taken too much of this medicine contact a poison control center or emergency room at once. °NOTE: This medicine is only for you. Do  not share this medicine with others. °What if I miss a dose? °If you miss a dose, take it as soon as you can. If it is almost time for your next dose, take only that dose. Do not take double or extra doses. °What may interact with this medicine? °Do not take this medicine with any of the following medications: °-darbepoetin alfa °This list may not describe all possible interactions. Give your health care provider a list of all the medicines, herbs, non-prescription drugs, or dietary supplements you use. Also tell them if you smoke, drink alcohol, or use illegal drugs. Some items may interact with your medicine. °What should I watch for while using this medicine? °Visit your prescriber or health care professional for regular checks on your progress and for the needed blood tests and blood pressure measurements. It is especially important for the doctor to make sure your hemoglobin level is in the desired range, to limit the risk of potential side effects and to give you the best benefit. Keep all appointments for any recommended tests. Check your blood pressure as directed. Ask your doctor what your blood pressure should be and when you should contact him or her. °As your body makes more red blood cells, you may need to take iron, folic acid, or vitamin B supplements. Ask your doctor or health care provider which products are right for you. If you have kidney disease continue dietary restrictions, even though this medication can make you feel better. Talk with your doctor or health care professional about the foods you eat and the vitamins that you take. °What   side effects may I notice from receiving this medicine? °Side effects that you should report to your doctor or health care professional as soon as possible: °-allergic reactions like skin rash, itching or hives, swelling of the face, lips, or tongue °-breathing problems °-changes in vision °-chest pain °-confusion, trouble speaking or understanding °-feeling  faint or lightheaded, falls °-high blood pressure °-muscle aches or pains °-pain, swelling, warmth in the leg °-rapid weight gain °-severe headaches °-sudden numbness or weakness of the face, arm or leg °-trouble walking, dizziness, loss of balance or coordination °-seizures (convulsions) °-swelling of the ankles, feet, hands °-unusually weak or tired °Side effects that usually do not require medical attention (report to your doctor or health care professional if they continue or are bothersome): °-diarrhea °-fever, chills (flu-like symptoms) °-headaches °-nausea, vomiting °-redness, stinging, or swelling at site where injected °This list may not describe all possible side effects. Call your doctor for medical advice about side effects. You may report side effects to FDA at 1-800-FDA-1088. °Where should I keep my medicine? °Keep out of the reach of children. °Store in a refrigerator between 2 and 8 degrees C (36 and 46 degrees F). Do not freeze or shake. Throw away any unused portion if using a single-dose vial. Multi-dose vials can be kept in the refrigerator for up to 21 days after the initial dose. Throw away unused medicine. °NOTE: This sheet is a summary. It may not cover all possible information. If you have questions about this medicine, talk to your doctor, pharmacist, or health care provider. °© 2015, [employer]. (2008-03-02 10:25:44) ° °

## 2014-08-05 DIAGNOSIS — E10621 Type 1 diabetes mellitus with foot ulcer: Secondary | ICD-10-CM | POA: Diagnosis not present

## 2014-08-05 LAB — GLUCOSE, CAPILLARY
GLUCOSE-CAPILLARY: 75 mg/dL (ref 70–99)
Glucose-Capillary: 66 mg/dL — ABNORMAL LOW (ref 70–99)
Glucose-Capillary: 71 mg/dL (ref 70–99)
Glucose-Capillary: 94 mg/dL (ref 70–99)

## 2014-08-06 DIAGNOSIS — E10621 Type 1 diabetes mellitus with foot ulcer: Secondary | ICD-10-CM | POA: Diagnosis not present

## 2014-08-06 DIAGNOSIS — L97421 Non-pressure chronic ulcer of left heel and midfoot limited to breakdown of skin: Secondary | ICD-10-CM | POA: Diagnosis not present

## 2014-08-06 DIAGNOSIS — R609 Edema, unspecified: Secondary | ICD-10-CM | POA: Diagnosis not present

## 2014-08-06 DIAGNOSIS — L97412 Non-pressure chronic ulcer of right heel and midfoot with fat layer exposed: Secondary | ICD-10-CM | POA: Diagnosis not present

## 2014-08-06 LAB — GLUCOSE, CAPILLARY
GLUCOSE-CAPILLARY: 56 mg/dL — AB (ref 70–99)
Glucose-Capillary: 114 mg/dL — ABNORMAL HIGH (ref 70–99)
Glucose-Capillary: 457 mg/dL — ABNORMAL HIGH (ref 70–99)

## 2014-08-09 DIAGNOSIS — L97412 Non-pressure chronic ulcer of right heel and midfoot with fat layer exposed: Secondary | ICD-10-CM | POA: Diagnosis not present

## 2014-08-09 DIAGNOSIS — L97421 Non-pressure chronic ulcer of left heel and midfoot limited to breakdown of skin: Secondary | ICD-10-CM | POA: Diagnosis not present

## 2014-08-09 DIAGNOSIS — E10621 Type 1 diabetes mellitus with foot ulcer: Secondary | ICD-10-CM | POA: Diagnosis not present

## 2014-08-09 DIAGNOSIS — R609 Edema, unspecified: Secondary | ICD-10-CM | POA: Diagnosis not present

## 2014-08-10 DIAGNOSIS — L97412 Non-pressure chronic ulcer of right heel and midfoot with fat layer exposed: Secondary | ICD-10-CM | POA: Diagnosis not present

## 2014-08-10 DIAGNOSIS — R609 Edema, unspecified: Secondary | ICD-10-CM | POA: Diagnosis not present

## 2014-08-10 DIAGNOSIS — E10621 Type 1 diabetes mellitus with foot ulcer: Secondary | ICD-10-CM | POA: Diagnosis not present

## 2014-08-10 DIAGNOSIS — L97421 Non-pressure chronic ulcer of left heel and midfoot limited to breakdown of skin: Secondary | ICD-10-CM | POA: Diagnosis not present

## 2014-08-10 LAB — GLUCOSE, CAPILLARY
GLUCOSE-CAPILLARY: 137 mg/dL — AB (ref 70–99)
GLUCOSE-CAPILLARY: 130 mg/dL — AB (ref 70–99)
GLUCOSE-CAPILLARY: 314 mg/dL — AB (ref 70–99)
Glucose-Capillary: 240 mg/dL — ABNORMAL HIGH (ref 70–99)

## 2014-08-11 ENCOUNTER — Encounter (HOSPITAL_COMMUNITY)
Admission: RE | Admit: 2014-08-11 | Discharge: 2014-08-11 | Disposition: A | Discharge status: Home / Self Care | Payer: Medicaid Other | Source: Ambulatory Visit | Attending: Nephrology | Admitting: Nephrology

## 2014-08-11 ENCOUNTER — Encounter (HOSPITAL_COMMUNITY): Payer: Self-pay

## 2014-08-11 DIAGNOSIS — N183 Chronic kidney disease, stage 3 unspecified: Secondary | ICD-10-CM

## 2014-08-11 DIAGNOSIS — E10621 Type 1 diabetes mellitus with foot ulcer: Secondary | ICD-10-CM | POA: Diagnosis not present

## 2014-08-11 DIAGNOSIS — N184 Chronic kidney disease, stage 4 (severe): Secondary | ICD-10-CM | POA: Diagnosis not present

## 2014-08-11 LAB — GLUCOSE, CAPILLARY
GLUCOSE-CAPILLARY: 242 mg/dL — AB (ref 70–99)
Glucose-Capillary: 356 mg/dL — ABNORMAL HIGH (ref 70–99)

## 2014-08-11 LAB — HEMOGLOBIN: Hemoglobin: 12.6 g/dL (ref 12.0–15.0)

## 2014-08-12 DIAGNOSIS — R609 Edema, unspecified: Secondary | ICD-10-CM | POA: Diagnosis not present

## 2014-08-12 DIAGNOSIS — E10621 Type 1 diabetes mellitus with foot ulcer: Secondary | ICD-10-CM | POA: Diagnosis not present

## 2014-08-12 DIAGNOSIS — L97421 Non-pressure chronic ulcer of left heel and midfoot limited to breakdown of skin: Secondary | ICD-10-CM | POA: Diagnosis not present

## 2014-08-12 DIAGNOSIS — L97412 Non-pressure chronic ulcer of right heel and midfoot with fat layer exposed: Secondary | ICD-10-CM | POA: Diagnosis not present

## 2014-08-12 LAB — GLUCOSE, CAPILLARY
GLUCOSE-CAPILLARY: 64 mg/dL — AB (ref 65–99)
Glucose-Capillary: 67 mg/dL (ref 65–99)
Glucose-Capillary: 92 mg/dL (ref 65–99)
Glucose-Capillary: 116 mg/dL — ABNORMAL HIGH (ref 65–99)
Glucose-Capillary: 287 mg/dL — ABNORMAL HIGH (ref 65–99)

## 2014-08-13 DIAGNOSIS — E10621 Type 1 diabetes mellitus with foot ulcer: Secondary | ICD-10-CM | POA: Diagnosis not present

## 2014-08-13 DIAGNOSIS — L97421 Non-pressure chronic ulcer of left heel and midfoot limited to breakdown of skin: Secondary | ICD-10-CM | POA: Diagnosis not present

## 2014-08-13 DIAGNOSIS — L97412 Non-pressure chronic ulcer of right heel and midfoot with fat layer exposed: Secondary | ICD-10-CM | POA: Diagnosis not present

## 2014-08-13 DIAGNOSIS — R609 Edema, unspecified: Secondary | ICD-10-CM | POA: Diagnosis not present

## 2014-08-13 LAB — GLUCOSE, CAPILLARY
GLUCOSE-CAPILLARY: 276 mg/dL — AB (ref 65–99)
Glucose-Capillary: 164 mg/dL — ABNORMAL HIGH (ref 65–99)

## 2014-08-16 ENCOUNTER — Ambulatory Visit (INDEPENDENT_AMBULATORY_CARE_PROVIDER_SITE_OTHER): Payer: Medicaid Other | Admitting: Endocrinology

## 2014-08-16 ENCOUNTER — Encounter: Payer: Self-pay | Admitting: Endocrinology

## 2014-08-16 VITALS — BP 132/80 | HR 98 | Temp 98.2°F | Ht 65.0 in | Wt 132.0 lb

## 2014-08-16 DIAGNOSIS — L97421 Non-pressure chronic ulcer of left heel and midfoot limited to breakdown of skin: Secondary | ICD-10-CM | POA: Diagnosis not present

## 2014-08-16 DIAGNOSIS — IMO0002 Reserved for concepts with insufficient information to code with codable children: Secondary | ICD-10-CM

## 2014-08-16 DIAGNOSIS — E1121 Type 2 diabetes mellitus with diabetic nephropathy: Secondary | ICD-10-CM | POA: Diagnosis not present

## 2014-08-16 DIAGNOSIS — R609 Edema, unspecified: Secondary | ICD-10-CM | POA: Diagnosis not present

## 2014-08-16 DIAGNOSIS — E1041 Type 1 diabetes mellitus with diabetic mononeuropathy: Secondary | ICD-10-CM | POA: Diagnosis not present

## 2014-08-16 DIAGNOSIS — E1049 Type 1 diabetes mellitus with other diabetic neurological complication: Secondary | ICD-10-CM

## 2014-08-16 DIAGNOSIS — E1065 Type 1 diabetes mellitus with hyperglycemia: Principal | ICD-10-CM

## 2014-08-16 DIAGNOSIS — L97412 Non-pressure chronic ulcer of right heel and midfoot with fat layer exposed: Secondary | ICD-10-CM | POA: Diagnosis not present

## 2014-08-16 DIAGNOSIS — E10621 Type 1 diabetes mellitus with foot ulcer: Secondary | ICD-10-CM | POA: Diagnosis not present

## 2014-08-16 LAB — GLUCOSE, CAPILLARY
GLUCOSE-CAPILLARY: 90 mg/dL (ref 65–99)
Glucose-Capillary: 133 mg/dL — ABNORMAL HIGH (ref 65–99)
Glucose-Capillary: 231 mg/dL — ABNORMAL HIGH (ref 65–99)

## 2014-08-16 NOTE — Patient Instructions (Signed)
Check sugar 4x daily 

## 2014-08-16 NOTE — Progress Notes (Signed)
Patient ID: [name], female   DOB: 1970/04/16, 44 y.o.   MRN: [id number]   Reason for Appointment: Insulin pump follow-up   History of Present Illness   Diagnosis: Type 1 DIABETES MELITUS  Previous history: She has had long-standing poorly controlled diabetes and typically has poor compliance with self care measures despite periodic diabetes education and periodic followup  INSULIN PUMP regimen:   Basal rate: Midnight = 0.325, 6 AM = 0.4, 12 noon = 0.5,5 PM = 0.55 and 10 PM = 0.6. Total basal insulin 11units daily  Carb coverage 1: 18 g and correction 1: 70 with target 150, active insulin 4 hours         Recent history:  She has had overall lower blood sugars compared to her last visit Not clear why she has had lower blood sugars overall; although her basal rates was supposed to be increased significantly during the night on the last visit she has reduced this on her own again because of tendency to hypoglycemia on waking up  Current blood sugar patterns, difficulties with management and problems identified:  Her blood sugars are averaging 213 on this visit compared to 371 previously  She has checked her blood sugars a little more frequently and evenings but mostly at bedtime and not around supper time  She says that she has had a couple of episodes of significant hypoglycemia, once requiring assistance by EMS.  Mostly has had low sugars around 7-8 AM although documented only once in the last 2 weeks on 5/6  She is afraid of hypoglycemia and is eating large amounts of food in the evenings and not bolusing for evening meal  Blood sugars at bedtime are frequently over 200  She is this connecting her pump arbitrarily at various times especially in the last week and mostly in the morning for fear of hypoglycemia; this has reportedly caused her blood sugar to be over 200 especially last weekend  FASTING blood sugars when she checks them are relatively good and not  low in the last week  She is not getting any hypoglycemia following boluses even though her sensitivity was changed on her last visit  She thinks her blood sugars were much better in the hospital when she was able to be better compliant with meals and boluses as well as with glucose monitoring Current blood sugar results from pump download:  PRE-MEAL Breakfast Lunch Dinner Bedtime Overall  Glucose range:  49-358   252  ?    119-400+    Mean/median:  153     281  213   Meals: eating at variable times and also having snacks periodically.  Her daily carbohydrate intake is quite variable Physical activity: exercise: none         Dietician visit: Most recent:?          Complications: are: Neuropathy, nephropathy, diabetic foot ulcer      Wt Readings from Last 3 Encounters:  08/16/14 132 lb (59.875 kg)  07/16/14 132 lb 9.6 oz (60.147 kg)  06/17/14 202 lb 4.8 oz (91.763 kg)   Lab Results  Component Value Date   HGBA1C 9.8* 07/05/2014   HGBA1C 10.0* 05/27/2014   HGBA1C 12.4* 01/06/2014   Lab Results  Component Value Date   MICROALBUR 108.7* 04/08/2013   LDLCALC 128* 07/31/2013   CREATININE 1.87* 07/05/2014        Medication List       This list is accurate as of: 08/16/14  3:23  PM.  Always use your most recent med list.               albuterol 108 (90 BASE) MCG/ACT inhaler  Commonly known as:  PROVENTIL HFA;VENTOLIN HFA  Inhale 2 puffs into the lungs every 6 (six) hours as needed. S.O.B.     calcitRIOL 0.25 MCG capsule  Commonly known as:  ROCALTROL  Take 0.25 mcg by mouth. M,W,F     CALCIUM PO  Take 1 tablet by mouth daily.     doxycycline 100 MG tablet  Commonly known as:  VIBRA-TABS  Take 1 tablet (100 mg total) by mouth every 12 (twelve) hours.     ferrous sulfate 325 (65 FE) MG tablet  Take 325 mg by mouth 2 (two) times daily with a meal.     folic acid 1 MG tablet  Commonly known as:  FOLVITE  Take 1 mg by mouth daily.     insulin aspart 100 UNIT/ML  injection  Commonly known as:  novoLOG  Use max 65 units per day with pump     loperamide 2 MG capsule  Commonly known as:  IMODIUM  As directed for diarrhea. Over the counter.     oxyCODONE-acetaminophen 5-325 MG per tablet  Commonly known as:  ROXICET  Take 1 tablet by mouth every 4 (four) hours as needed for severe pain.     pravastatin 40 MG tablet  Commonly known as:  PRAVACHOL  TAKE 1/2 TABLETS (20 MG TOTAL) BY MOUTH DAILY.     pyridoxine 100 MG tablet  Commonly known as:  B-6  Take 100 mg by mouth daily.     silver sulfADIAZINE 1 % cream  Commonly known as:  SILVADENE  Apply 1 application topically daily. Apply Silvadene plus dry dressing to right and left foot wounds change daily     sodium bicarbonate 650 MG tablet  Take 1 tablet (650 mg total) by mouth 3 (three) times daily.     vitamin B-12 100 MCG tablet  Commonly known as:  CYANOCOBALAMIN  Take 100 mcg by mouth daily.        Allergies:  Allergies  Allergen Reactions  . Ciprofloxacin Itching  . Cleocin [Clindamycin Hcl]     Diarrhea     Past Medical History  Diagnosis Date  . Diabetes mellitus   . Neuropathy in diabetes   . Hypertension   . Hypercholesteremia   . H/O seasonal allergies   . Anemia   . Left foot infection     Past Surgical History  Procedure Laterality Date  . Cesarean section    . Eye surgery    . Hemrrhoidectomy    . Peripherally inserted central catheter insertion    . I&d extremity Right 06/10/2014    Procedure: IRRIGATION AND DEBRIDEMENT Right Foot;  Surgeon: Newt Minion, MD;  Location: [place];  Service: Orthopedics;  Laterality: Right;  . Skin split graft Right 06/15/2014    Procedure: SPLIT THICKNESS SKIN GRAFT RIGHT FOOT;  Surgeon: Newt Minion, MD;  Location: [place];  Service: Orthopedics;  Laterality: Right;    No family history on file.  Social History:  reports that she quit smoking about 6 years ago. She has never used smokeless tobacco. She reports that she  drinks about 0.5 oz of alcohol per week. She reports that she does not use illicit drugs.  Review of Systems:  Hypertension/CKD:   Her lisinopril and losartan had been stopped because of hyperkalemia and higher creatinine  levels. Continues to have mildly increased potassium levels Blood pressure is quite normal  Lab Results  Component Value Date   CREATININE 1.87* 07/05/2014   BUN 41* 07/05/2014   NA 132* 07/05/2014   K 5.3* 07/05/2014   CL 104 07/05/2014   CO2 23 07/05/2014     Lipids: Has been  prescribed pravastatin but she does not take this, she wants to avoid taking medications  Lab Results  Component Value Date   CHOL 208* 07/05/2014   HDL 26.10* 07/05/2014   LDLCALC 128* 07/31/2013   LDLDIRECT 112.0 07/05/2014   TRIG 258.0* 07/05/2014   CHOLHDL 8 07/05/2014     She has been treated by nephrologist for anemia and has required iron infusion, hemoglobin improve now  Lab Results  Component Value Date   WBC 10.2 06/18/2014   HGB 12.6 08/11/2014   HCT 26.2* 06/18/2014   MCV 84.0 06/18/2014   PLT 868* 06/18/2014   She has seen the wound clinic for bilateral foot ulcers and had a skin graft for the right foot ulcer Still has a large open wound on the plantar surface of the right foot and is now taking doxycycline   Examination:   BP 132/80 mmHg  Pulse 98  Temp(Src) 98.2 F (36.8 C) (Oral)  Ht 5\' 5"  (1.651 m)  Wt 132 lb (59.875 kg)  BMI 21.97 kg/m2  SpO2 96%  Body mass index is 21.97 kg/(m^2).     ASSESSMENT/ PLAN:   Diabetes type 1 with poor control and multiple complications    Blood glucose levels  are still poorly controlled  See history of present illness for details of her blood sugar patterns, current management and problems identified. Blood sugars are appearing to be lower and she is seemingly more insulin sensitive recently She is not requiring much more basal insulin compared to her last visit but blood sugars are tending to be lower especially  overnight Although her blood sugars are improved overall she is afraid of hypoglycemia and not covering her evening meals with insulin for fear of hypoglycemia overnight She is also not bolusing with evening meals without causing significant hyperglycemia on some days  DIABETIC nephropathy: Followed by nephrologist Since she has nephrotic syndrome not clear if there is any role in low protein diet and this was discussed  Stressed the importance of better diabetes control  PLAN:  Basal rates to be reduced between 5 AM-12 noon down to 0.35 Also basal rate after 5 PM will be only 0.50 She needs to start bolusing for carbohydrate intake and avoiding juices She will review her management and blood sugar patterns with the nurse educator next week again Needs balanced meals  She was advised to check more readings throughout the day including before eating and also if blood sugars are high she will need to do a follow-up reading Avoid suspending pump for longer periods of time unless blood sugars are low Have protein with every meal and avoid juices Followup in 5 weeks  Counseling time on subjects discussed above is over 50% of today's 25 minute visit  She needs to discuss proper antibiotic treatments with wound care center instead of taking only once a day doxycycline which she is doing currently  [facility] 08/16/2014, 3:23 PM

## 2014-08-17 ENCOUNTER — Encounter (HOSPITAL_COMMUNITY): Payer: Medicaid Other

## 2014-08-17 DIAGNOSIS — R609 Edema, unspecified: Secondary | ICD-10-CM | POA: Diagnosis not present

## 2014-08-17 DIAGNOSIS — L97412 Non-pressure chronic ulcer of right heel and midfoot with fat layer exposed: Secondary | ICD-10-CM | POA: Diagnosis not present

## 2014-08-17 DIAGNOSIS — E10621 Type 1 diabetes mellitus with foot ulcer: Secondary | ICD-10-CM | POA: Diagnosis not present

## 2014-08-17 DIAGNOSIS — L97421 Non-pressure chronic ulcer of left heel and midfoot limited to breakdown of skin: Secondary | ICD-10-CM | POA: Diagnosis not present

## 2014-08-17 LAB — GLUCOSE, CAPILLARY
GLUCOSE-CAPILLARY: 217 mg/dL — AB (ref 65–99)
Glucose-Capillary: 91 mg/dL (ref 65–99)
Glucose-Capillary: 129 mg/dL — ABNORMAL HIGH (ref 65–99)
Glucose-Capillary: 95 mg/dL (ref 65–99)

## 2014-08-18 ENCOUNTER — Encounter (HOSPITAL_COMMUNITY)
Admission: RE | Admit: 2014-08-18 | Discharge: 2014-08-18 | Disposition: A | Discharge status: Home / Self Care | Payer: Medicaid Other | Source: Ambulatory Visit | Attending: Nephrology | Admitting: Nephrology

## 2014-08-18 DIAGNOSIS — N184 Chronic kidney disease, stage 4 (severe): Secondary | ICD-10-CM | POA: Diagnosis not present

## 2014-08-18 DIAGNOSIS — E10621 Type 1 diabetes mellitus with foot ulcer: Secondary | ICD-10-CM | POA: Diagnosis not present

## 2014-08-18 LAB — CBC WITH DIFFERENTIAL/PLATELET
Basophils Absolute: 0 10*3/uL (ref 0.0–0.1)
Basophils Relative: 0 % (ref 0–1)
Eosinophils Absolute: 0.3 10*3/uL (ref 0.0–0.7)
Eosinophils Relative: 5 % (ref 0–5)
HCT: 38.4 % (ref 36.0–46.0)
Hemoglobin: 12.3 g/dL (ref 12.0–15.0)
LYMPHS ABS: 1.7 10*3/uL (ref 0.7–4.0)
LYMPHS PCT: 25 % (ref 12–46)
MCH: 26.9 pg (ref 26.0–34.0)
MCHC: 32 g/dL (ref 30.0–36.0)
MCV: 84 fL (ref 78.0–100.0)
Monocytes Absolute: 0.3 10*3/uL (ref 0.1–1.0)
Monocytes Relative: 4 % (ref 3–12)
NEUTROS PCT: 66 % (ref 43–77)
Neutro Abs: 4.6 10*3/uL (ref 1.7–7.7)
PLATELETS: 328 10*3/uL (ref 150–400)
RBC: 4.57 MIL/uL (ref 3.87–5.11)
RDW: 16.4 % — AB (ref 11.5–15.5)
WBC: 7 10*3/uL (ref 4.0–10.5)

## 2014-08-18 LAB — RENAL FUNCTION PANEL
ALBUMIN: 2.6 g/dL — AB (ref 3.5–5.0)
Anion gap: 11 (ref 5–15)
BUN: 34 mg/dL — ABNORMAL HIGH (ref 6–20)
CALCIUM: 7.8 mg/dL — AB (ref 8.9–10.3)
CHLORIDE: 106 mmol/L (ref 101–111)
CO2: 17 mmol/L — AB (ref 22–32)
CREATININE: 1.76 mg/dL — AB (ref 0.44–1.00)
GFR, EST AFRICAN AMERICAN: 40 mL/min — AB (ref >60)
GFR, EST NON AFRICAN AMERICAN: 35 mL/min — AB (ref >60)
Glucose, Bld: 255 mg/dL — ABNORMAL HIGH (ref 65–99)
Phosphorus: 3.5 mg/dL (ref 2.5–4.6)
Potassium: 3.8 mmol/L (ref 3.5–5.1)
SODIUM: 134 mmol/L — AB (ref 135–145)

## 2014-08-18 LAB — IRON AND TIBC
IRON: 143 ug/dL (ref 28–170)
Saturation Ratios: 63 % — ABNORMAL HIGH (ref 10.4–31.8)
TIBC: 227 ug/dL — ABNORMAL LOW (ref 250–450)
UIBC: 84 ug/dL

## 2014-08-18 LAB — GLUCOSE, CAPILLARY
GLUCOSE-CAPILLARY: 101 mg/dL — AB (ref 65–99)
GLUCOSE-CAPILLARY: 102 mg/dL — AB (ref 65–99)
Glucose-Capillary: 553 mg/dL (ref 65–99)
Glucose-Capillary: 100 mg/dL — ABNORMAL HIGH (ref 65–99)
Glucose-Capillary: 99 mg/dL (ref 65–99)
Glucose-Capillary: 161 mg/dL — ABNORMAL HIGH (ref 65–99)

## 2014-08-18 LAB — PROTEIN / CREATININE RATIO, URINE
Creatinine, Urine: 42.07 mg/dL
Protein Creatinine Ratio: 1.28 mg/mg{Cre} — ABNORMAL HIGH (ref 0.00–0.15)
TOTAL PROTEIN, URINE: 54 mg/dL

## 2014-08-18 LAB — FERRITIN: Ferritin: 62 ng/mL (ref 11–307)

## 2014-08-18 NOTE — Progress Notes (Signed)
Patient stated she arrived for her hyperbaric treatment today and her blood sugar was too high so they sent her home to do her own insulin treatments and monitor her blood sugars and to arrive back for treatment at 1p. Labs drawn and urine obtained  as per Dr Jason Nest orders today

## 2014-08-18 NOTE — Discharge Instructions (Signed)
Epoetin Alfa injection °What is this medicine? °EPOETIN ALFA (e POE e tin AL fa) helps your body make more red blood cells. This medicine is used to treat anemia caused by chronic kidney failure, cancer chemotherapy, or HIV-therapy. It may also be used before surgery if you have anemia. °This medicine may be used for other purposes; ask your health care provider or pharmacist if you have questions. °COMMON BRAND NAME(S): Epogen, Procrit °What should I tell my health care provider before I take this medicine? °They need to know if you have any of these conditions: °-blood clotting disorders °-cancer patient not on chemotherapy °-cystic fibrosis °-heart disease, such as angina or heart failure °-hemoglobin level of 12 g/dL or greater °-high blood pressure °-low levels of folate, iron, or vitamin B12 °-seizures °-an unusual or allergic reaction to erythropoietin, albumin, benzyl alcohol, hamster proteins, other medicines, foods, dyes, or preservatives °-pregnant or trying to get pregnant °-breast-feeding °How should I use this medicine? °This medicine is for injection into a vein or under the skin. It is usually given by a health care professional in a hospital or clinic setting. °If you get this medicine at home, you will be taught how to prepare and give this medicine. Use exactly as directed. Take your medicine at regular intervals. Do not take your medicine more often than directed. °It is important that you put your used needles and syringes in a special sharps container. Do not put them in a trash can. If you do not have a sharps container, call your pharmacist or healthcare provider to get one. °Talk to your pediatrician regarding the use of this medicine in children. While this drug may be prescribed for selected conditions, precautions do apply. °Overdosage: If you think you have taken too much of this medicine contact a poison control center or emergency room at once. °NOTE: This medicine is only for you. Do  not share this medicine with others. °What if I miss a dose? °If you miss a dose, take it as soon as you can. If it is almost time for your next dose, take only that dose. Do not take double or extra doses. °What may interact with this medicine? °Do not take this medicine with any of the following medications: °-darbepoetin alfa °This list may not describe all possible interactions. Give your health care provider a list of all the medicines, herbs, non-prescription drugs, or dietary supplements you use. Also tell them if you smoke, drink alcohol, or use illegal drugs. Some items may interact with your medicine. °What should I watch for while using this medicine? °Visit your prescriber or health care professional for regular checks on your progress and for the needed blood tests and blood pressure measurements. It is especially important for the doctor to make sure your hemoglobin level is in the desired range, to limit the risk of potential side effects and to give you the best benefit. Keep all appointments for any recommended tests. Check your blood pressure as directed. Ask your doctor what your blood pressure should be and when you should contact him or her. °As your body makes more red blood cells, you may need to take iron, folic acid, or vitamin B supplements. Ask your doctor or health care provider which products are right for you. If you have kidney disease continue dietary restrictions, even though this medication can make you feel better. Talk with your doctor or health care professional about the foods you eat and the vitamins that you take. °What   side effects may I notice from receiving this medicine? °Side effects that you should report to your doctor or health care professional as soon as possible: °-allergic reactions like skin rash, itching or hives, swelling of the face, lips, or tongue °-breathing problems °-changes in vision °-chest pain °-confusion, trouble speaking or understanding °-feeling  faint or lightheaded, falls °-high blood pressure °-muscle aches or pains °-pain, swelling, warmth in the leg °-rapid weight gain °-severe headaches °-sudden numbness or weakness of the face, arm or leg °-trouble walking, dizziness, loss of balance or coordination °-seizures (convulsions) °-swelling of the ankles, feet, hands °-unusually weak or tired °Side effects that usually do not require medical attention (report to your doctor or health care professional if they continue or are bothersome): °-diarrhea °-fever, chills (flu-like symptoms) °-headaches °-nausea, vomiting °-redness, stinging, or swelling at site where injected °This list may not describe all possible side effects. Call your doctor for medical advice about side effects. You may report side effects to FDA at 1-800-FDA-1088. °Where should I keep my medicine? °Keep out of the reach of children. °Store in a refrigerator between 2 and 8 degrees C (36 and 46 degrees F). Do not freeze or shake. Throw away any unused portion if using a single-dose vial. Multi-dose vials can be kept in the refrigerator for up to 21 days after the initial dose. Throw away unused medicine. °NOTE: This sheet is a summary. It may not cover all possible information. If you have questions about this medicine, talk to your doctor, pharmacist, or health care provider. °© 2015, [employer]. (2008-03-02 10:25:44) ° °

## 2014-08-19 DIAGNOSIS — L97412 Non-pressure chronic ulcer of right heel and midfoot with fat layer exposed: Secondary | ICD-10-CM | POA: Diagnosis not present

## 2014-08-19 DIAGNOSIS — R609 Edema, unspecified: Secondary | ICD-10-CM | POA: Diagnosis not present

## 2014-08-19 DIAGNOSIS — L97421 Non-pressure chronic ulcer of left heel and midfoot limited to breakdown of skin: Secondary | ICD-10-CM | POA: Diagnosis not present

## 2014-08-19 DIAGNOSIS — E10621 Type 1 diabetes mellitus with foot ulcer: Secondary | ICD-10-CM | POA: Diagnosis not present

## 2014-08-19 LAB — GLUCOSE, CAPILLARY
GLUCOSE-CAPILLARY: 126 mg/dL — AB (ref 65–99)
Glucose-Capillary: 141 mg/dL — ABNORMAL HIGH (ref 65–99)

## 2014-08-20 DIAGNOSIS — E10621 Type 1 diabetes mellitus with foot ulcer: Secondary | ICD-10-CM | POA: Diagnosis not present

## 2014-08-20 DIAGNOSIS — L97421 Non-pressure chronic ulcer of left heel and midfoot limited to breakdown of skin: Secondary | ICD-10-CM | POA: Diagnosis not present

## 2014-08-20 DIAGNOSIS — L97412 Non-pressure chronic ulcer of right heel and midfoot with fat layer exposed: Secondary | ICD-10-CM | POA: Diagnosis not present

## 2014-08-20 DIAGNOSIS — R609 Edema, unspecified: Secondary | ICD-10-CM | POA: Diagnosis not present

## 2014-08-20 IMAGING — CR DG CHEST 2V
2 series · 2 of 2 positions shown · non-contrast
Comparison: Portable chest x-ray June 02, 2011

CLINICAL DATA: Fever and chills and nausea and vomiting.

EXAM:
CHEST  2 VIEW

[w chest pa]
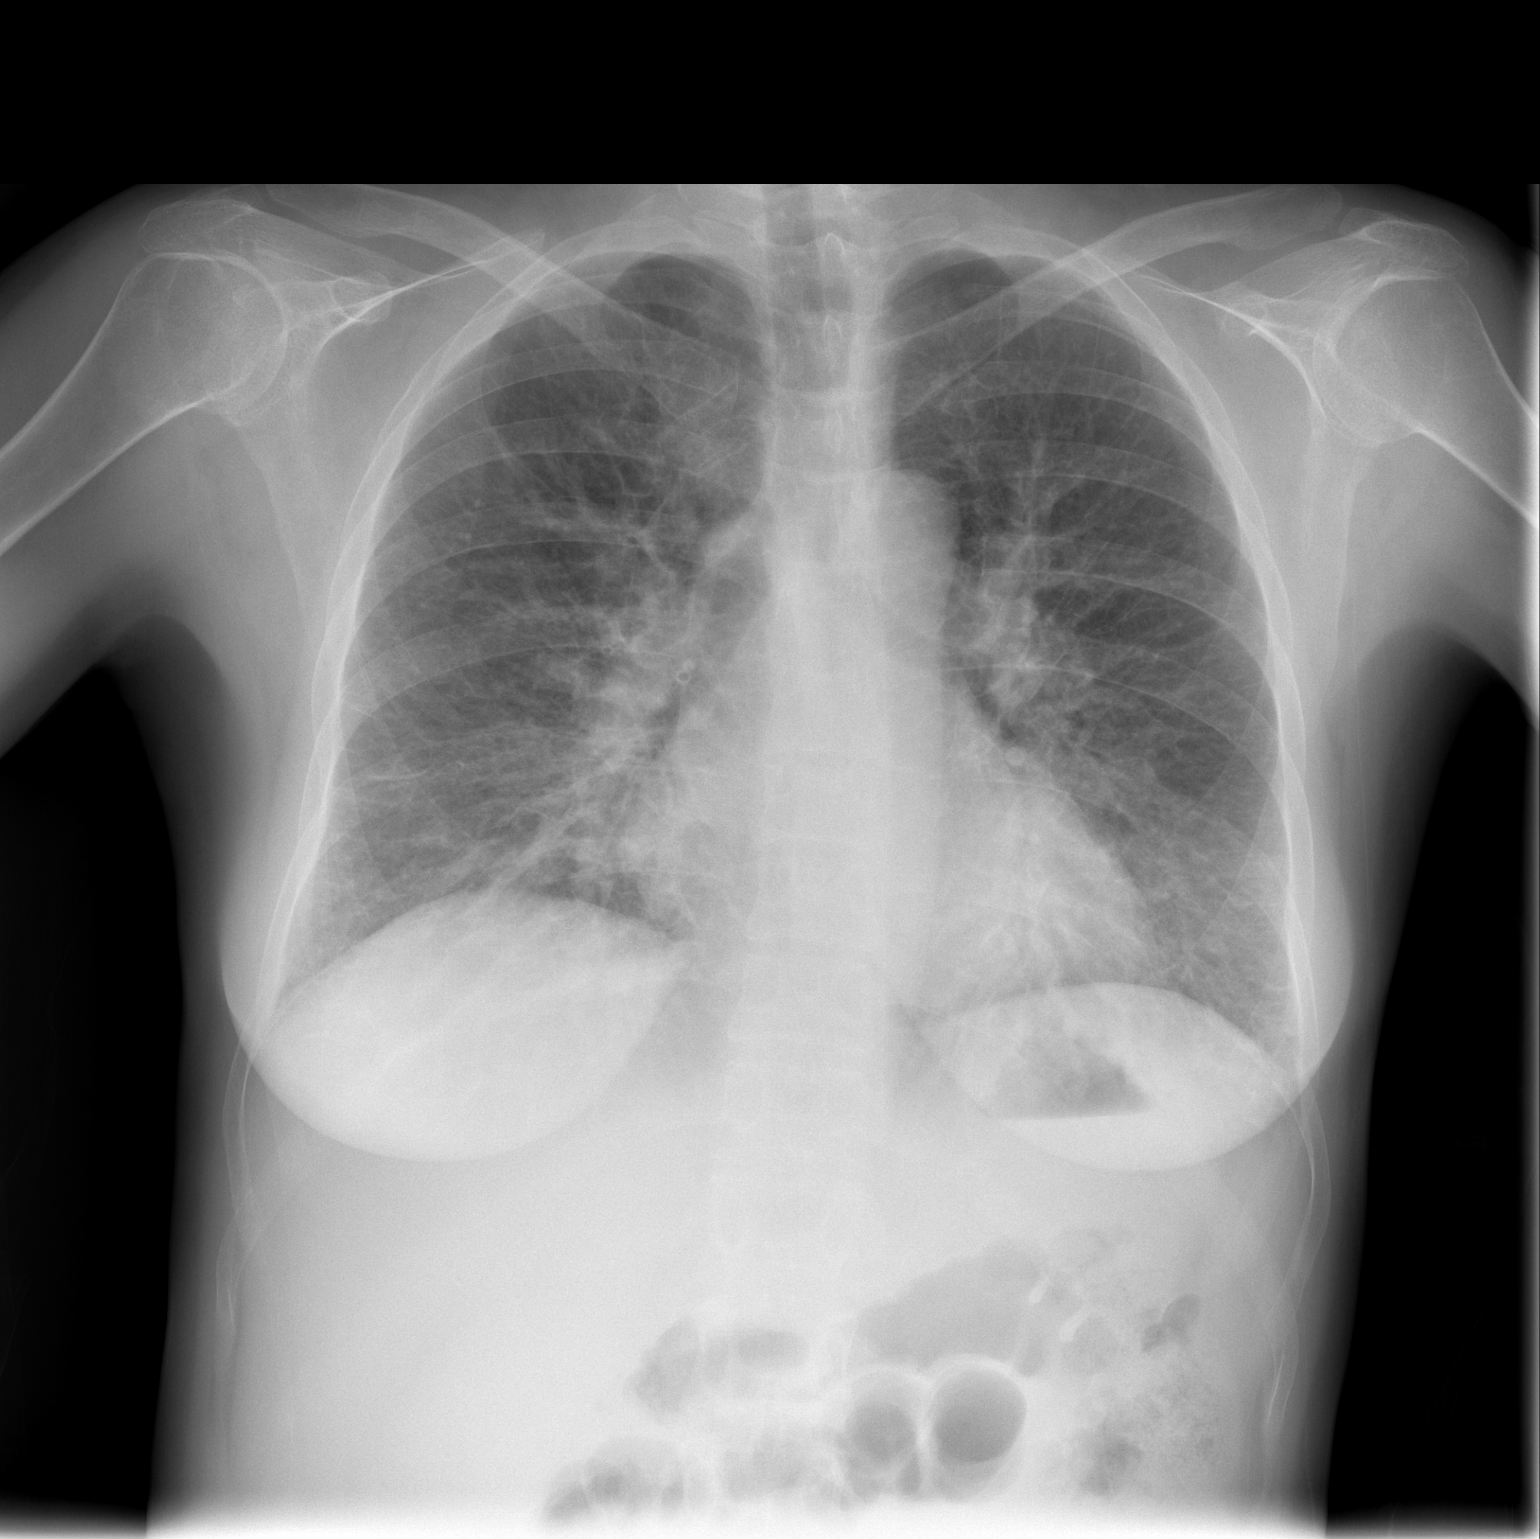

[w chest lat]
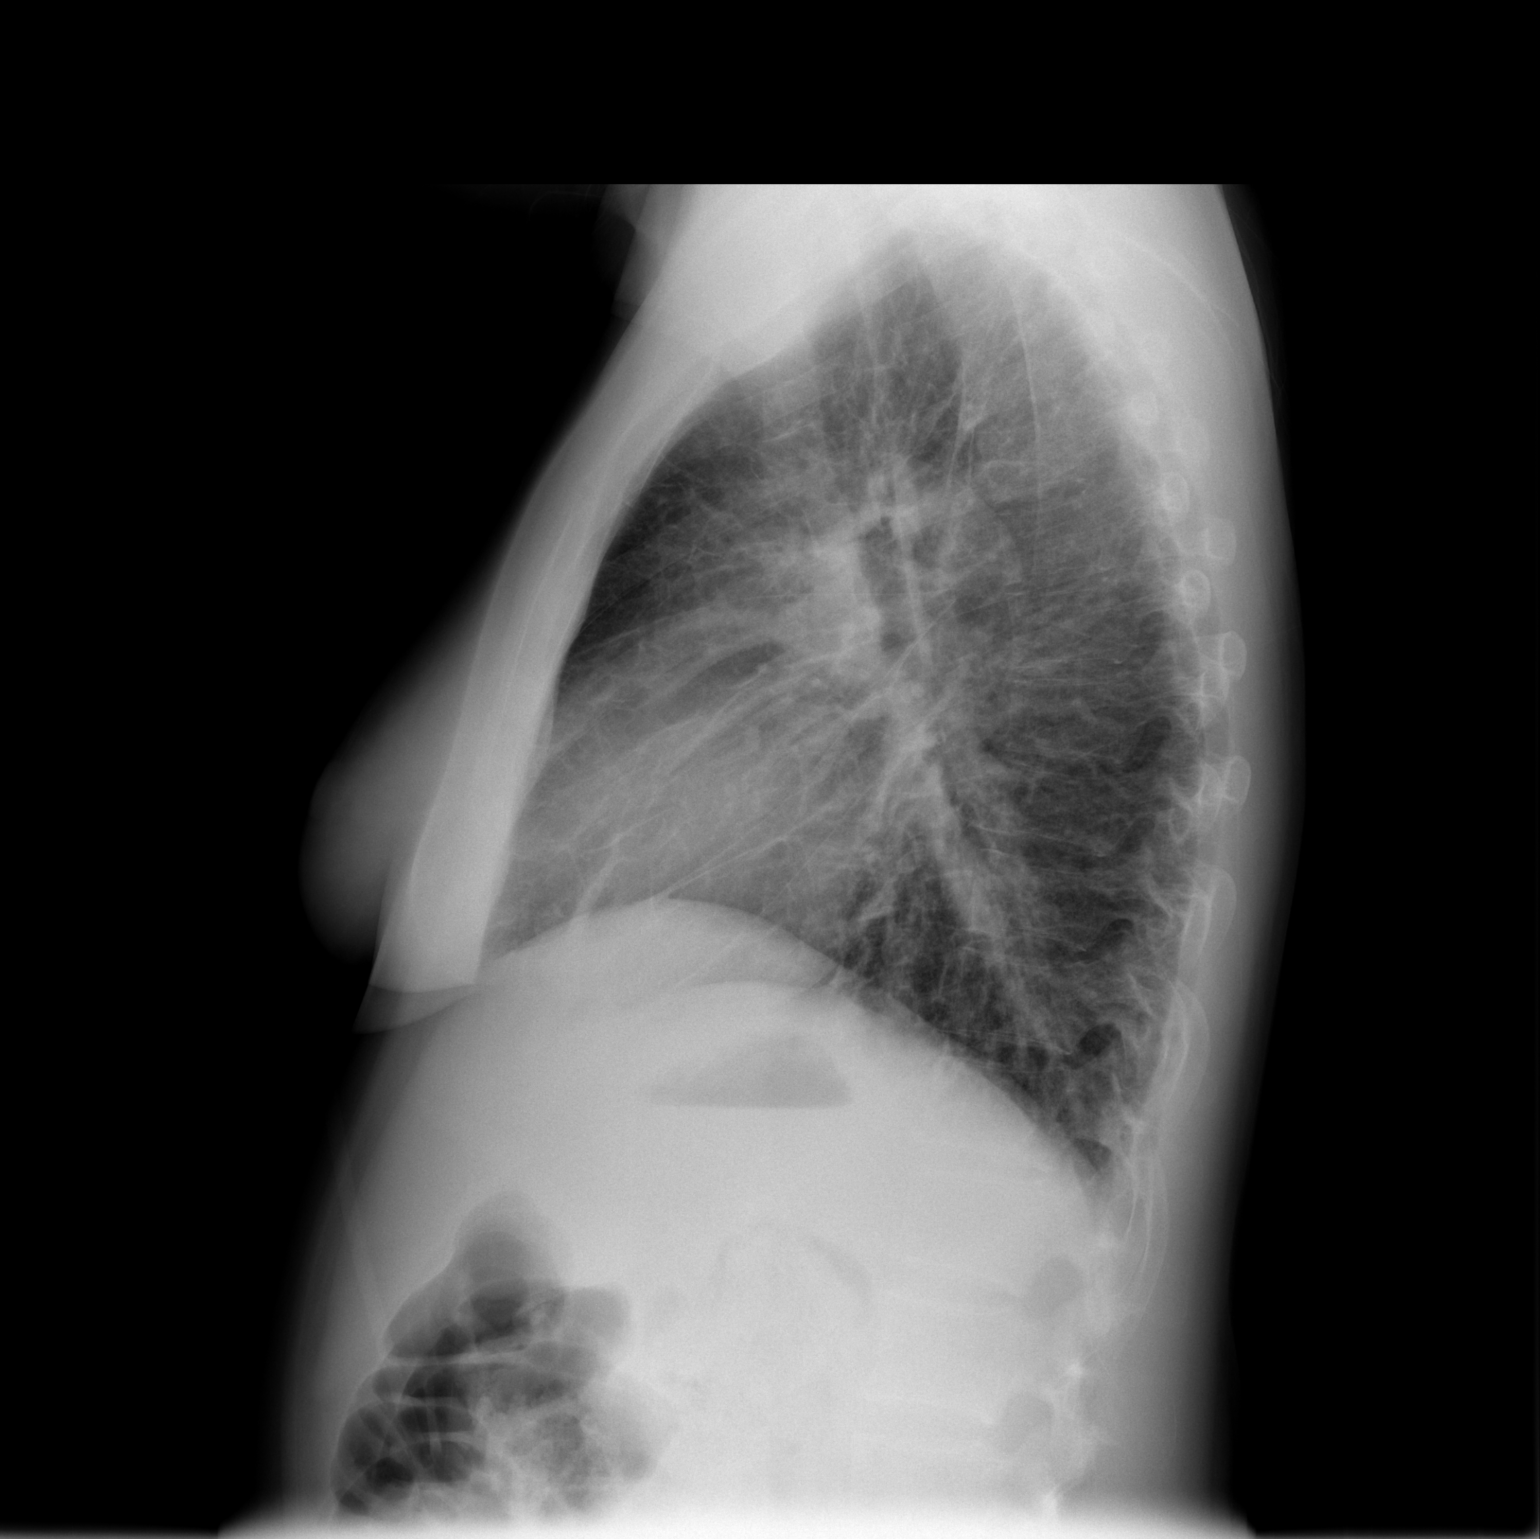

[2 of 2 positions shown; findings below may reference images not displayed]

FINDINGS: The lungs are adequately inflated. The interstitial markings are
increased bilaterally when compared to the previous study but they
are not entirely new. Coarse retrocardiac lung markings are visible
on the lateral film. The cardiopericardial silhouette is not
enlarged. The pulmonary vascularity is prominent centrally. There is
no pleural effusion. There is no pneumothorax. The observed portions
of the bony thorax appear normal.
IMPRESSION: The lungs are adequately inflated but demonstrate increased
interstitial markings diffusely suggesting mild interstitial edema
versus early interstitial pneumonia. In addition confluent density
in the retrocardiac region on the right is worrisome for atelectasis
or early pneumonia.

Follow-up films following therapy are recommended to assure
clearing.

## 2014-08-21 IMAGING — CR DG ABD PORTABLE 1V
1 series · 1 of 1 positions shown · non-contrast
Comparison: May 29, 2005

CLINICAL DATA: Diarrhea and abdominal distention

EXAM:
PORTABLE ABDOMEN - 1 VIEW

[ap (kub)]
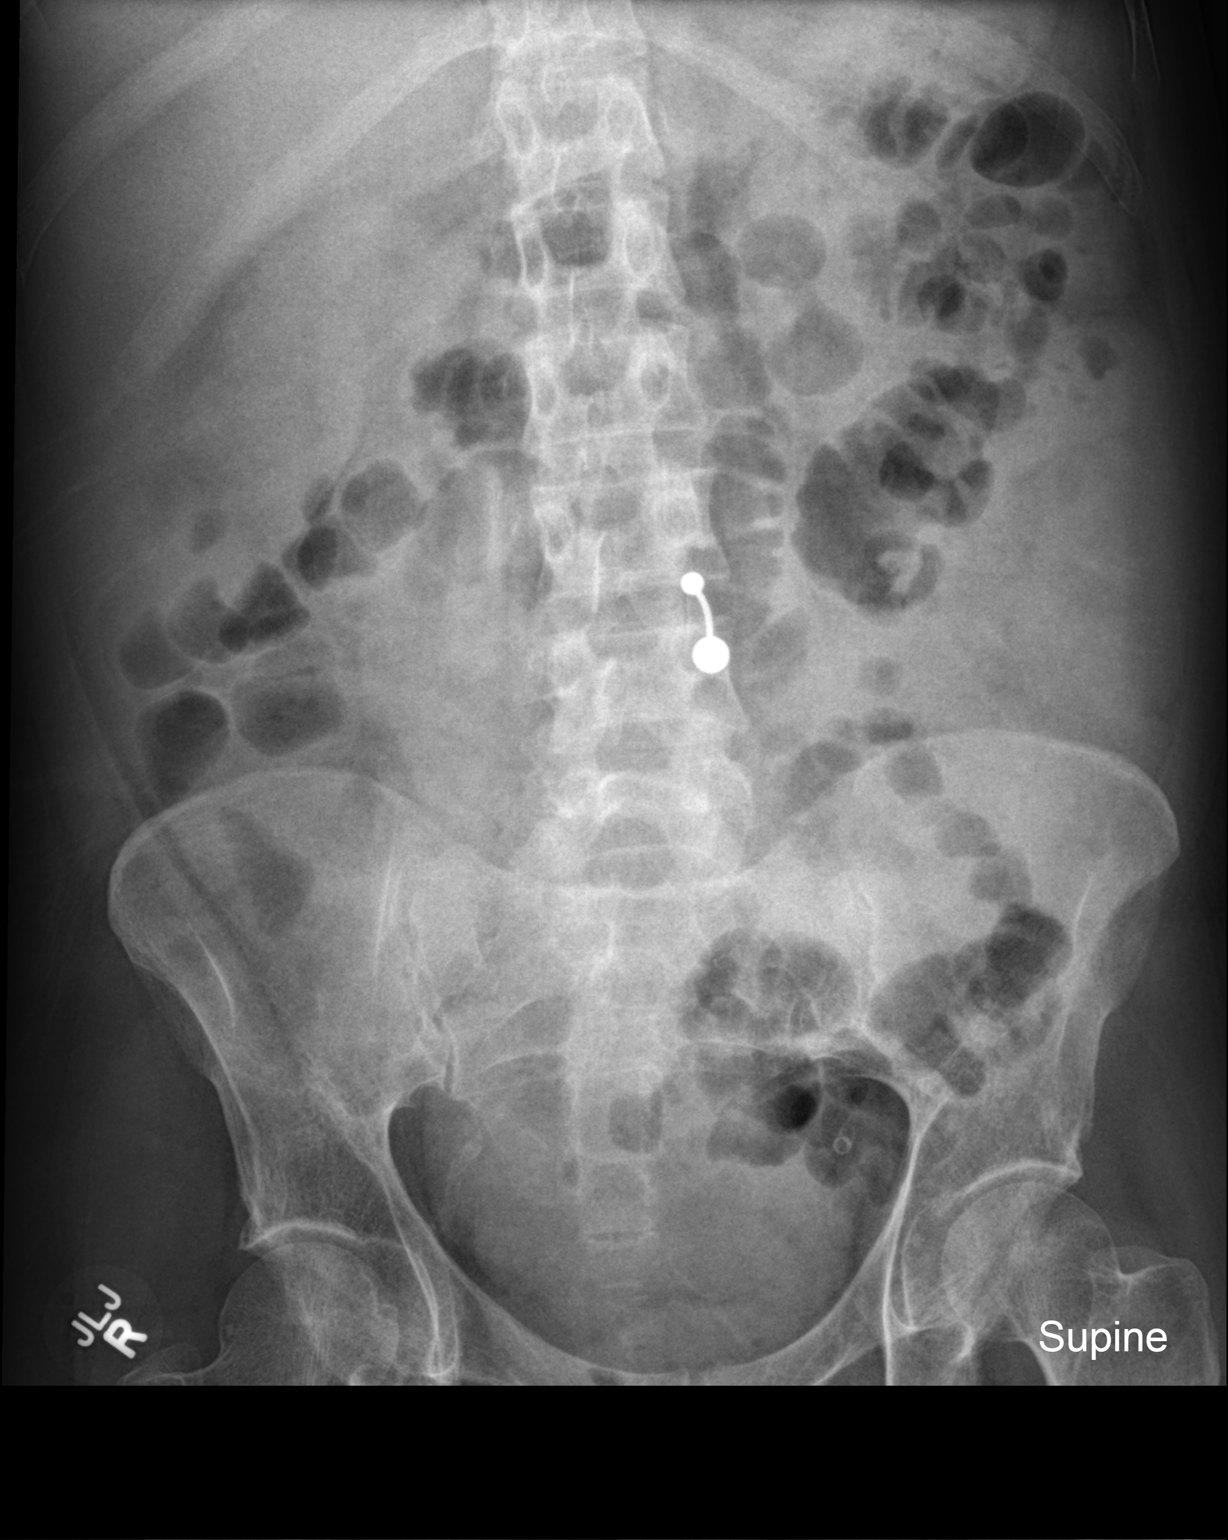

[1 of 1 positions shown; findings below may reference images not displayed]

FINDINGS: There is moderate stool throughout the colon. The bowel gas pattern
is unremarkable. No obstruction or free air is seen on this supine
examination. There is arterial vascular calcification in the pelvis
bilaterally.
IMPRESSION: Moderate stool. Bowel gas pattern unremarkable. Atherosclerotic
calcification.

## 2014-08-23 DIAGNOSIS — R609 Edema, unspecified: Secondary | ICD-10-CM | POA: Diagnosis not present

## 2014-08-23 DIAGNOSIS — L97421 Non-pressure chronic ulcer of left heel and midfoot limited to breakdown of skin: Secondary | ICD-10-CM | POA: Diagnosis not present

## 2014-08-23 DIAGNOSIS — E10621 Type 1 diabetes mellitus with foot ulcer: Secondary | ICD-10-CM | POA: Diagnosis not present

## 2014-08-23 DIAGNOSIS — L97412 Non-pressure chronic ulcer of right heel and midfoot with fat layer exposed: Secondary | ICD-10-CM | POA: Diagnosis not present

## 2014-08-23 LAB — GLUCOSE, CAPILLARY
GLUCOSE-CAPILLARY: 260 mg/dL — AB (ref 65–99)
GLUCOSE-CAPILLARY: 167 mg/dL — AB (ref 65–99)
Glucose-Capillary: 151 mg/dL — ABNORMAL HIGH (ref 65–99)
Glucose-Capillary: 182 mg/dL — ABNORMAL HIGH (ref 65–99)

## 2014-08-24 DIAGNOSIS — L97421 Non-pressure chronic ulcer of left heel and midfoot limited to breakdown of skin: Secondary | ICD-10-CM | POA: Diagnosis not present

## 2014-08-24 DIAGNOSIS — E10621 Type 1 diabetes mellitus with foot ulcer: Secondary | ICD-10-CM | POA: Diagnosis not present

## 2014-08-24 DIAGNOSIS — L97412 Non-pressure chronic ulcer of right heel and midfoot with fat layer exposed: Secondary | ICD-10-CM | POA: Diagnosis not present

## 2014-08-24 DIAGNOSIS — R609 Edema, unspecified: Secondary | ICD-10-CM | POA: Diagnosis not present

## 2014-08-24 LAB — GLUCOSE, CAPILLARY
GLUCOSE-CAPILLARY: 243 mg/dL — AB (ref 65–99)
Glucose-Capillary: 161 mg/dL — ABNORMAL HIGH (ref 65–99)

## 2014-08-25 ENCOUNTER — Encounter (HOSPITAL_COMMUNITY)
Admission: RE | Admit: 2014-08-25 | Discharge: 2014-08-25 | Disposition: A | Discharge status: Home / Self Care | Payer: Medicaid Other | Source: Ambulatory Visit | Attending: Nephrology | Admitting: Nephrology

## 2014-08-25 ENCOUNTER — Encounter (HOSPITAL_COMMUNITY): Payer: Self-pay

## 2014-08-25 DIAGNOSIS — N183 Chronic kidney disease, stage 3 unspecified: Secondary | ICD-10-CM

## 2014-08-25 DIAGNOSIS — E10621 Type 1 diabetes mellitus with foot ulcer: Secondary | ICD-10-CM | POA: Diagnosis not present

## 2014-08-25 DIAGNOSIS — N184 Chronic kidney disease, stage 4 (severe): Secondary | ICD-10-CM | POA: Diagnosis not present

## 2014-08-25 LAB — HEMOGLOBIN: Hemoglobin: 12.2 g/dL (ref 12.0–15.0)

## 2014-08-25 NOTE — Discharge Instructions (Signed)
Epoetin Alfa injection °What is this medicine? °EPOETIN ALFA (e POE e tin AL fa) helps your body make more red blood cells. This medicine is used to treat anemia caused by chronic kidney failure, cancer chemotherapy, or HIV-therapy. It may also be used before surgery if you have anemia. °This medicine may be used for other purposes; ask your health care provider or pharmacist if you have questions. °COMMON BRAND NAME(S): Epogen, Procrit °What should I tell my health care provider before I take this medicine? °They need to know if you have any of these conditions: °-blood clotting disorders °-cancer patient not on chemotherapy °-cystic fibrosis °-heart disease, such as angina or heart failure °-hemoglobin level of 12 g/dL or greater °-high blood pressure °-low levels of folate, iron, or vitamin B12 °-seizures °-an unusual or allergic reaction to erythropoietin, albumin, benzyl alcohol, hamster proteins, other medicines, foods, dyes, or preservatives °-pregnant or trying to get pregnant °-breast-feeding °How should I use this medicine? °This medicine is for injection into a vein or under the skin. It is usually given by a health care professional in a hospital or clinic setting. °If you get this medicine at home, you will be taught how to prepare and give this medicine. Use exactly as directed. Take your medicine at regular intervals. Do not take your medicine more often than directed. °It is important that you put your used needles and syringes in a special sharps container. Do not put them in a trash can. If you do not have a sharps container, call your pharmacist or healthcare provider to get one. °Talk to your pediatrician regarding the use of this medicine in children. While this drug may be prescribed for selected conditions, precautions do apply. °Overdosage: If you think you have taken too much of this medicine contact a poison control center or emergency room at once. °NOTE: This medicine is only for you. Do  not share this medicine with others. °What if I miss a dose? °If you miss a dose, take it as soon as you can. If it is almost time for your next dose, take only that dose. Do not take double or extra doses. °What may interact with this medicine? °Do not take this medicine with any of the following medications: °-darbepoetin alfa °This list may not describe all possible interactions. Give your health care provider a list of all the medicines, herbs, non-prescription drugs, or dietary supplements you use. Also tell them if you smoke, drink alcohol, or use illegal drugs. Some items may interact with your medicine. °What should I watch for while using this medicine? °Visit your prescriber or health care professional for regular checks on your progress and for the needed blood tests and blood pressure measurements. It is especially important for the doctor to make sure your hemoglobin level is in the desired range, to limit the risk of potential side effects and to give you the best benefit. Keep all appointments for any recommended tests. Check your blood pressure as directed. Ask your doctor what your blood pressure should be and when you should contact him or her. °As your body makes more red blood cells, you may need to take iron, folic acid, or vitamin B supplements. Ask your doctor or health care provider which products are right for you. If you have kidney disease continue dietary restrictions, even though this medication can make you feel better. Talk with your doctor or health care professional about the foods you eat and the vitamins that you take. °What   side effects may I notice from receiving this medicine? °Side effects that you should report to your doctor or health care professional as soon as possible: °-allergic reactions like skin rash, itching or hives, swelling of the face, lips, or tongue °-breathing problems °-changes in vision °-chest pain °-confusion, trouble speaking or understanding °-feeling  faint or lightheaded, falls °-high blood pressure °-muscle aches or pains °-pain, swelling, warmth in the leg °-rapid weight gain °-severe headaches °-sudden numbness or weakness of the face, arm or leg °-trouble walking, dizziness, loss of balance or coordination °-seizures (convulsions) °-swelling of the ankles, feet, hands °-unusually weak or tired °Side effects that usually do not require medical attention (report to your doctor or health care professional if they continue or are bothersome): °-diarrhea °-fever, chills (flu-like symptoms) °-headaches °-nausea, vomiting °-redness, stinging, or swelling at site where injected °This list may not describe all possible side effects. Call your doctor for medical advice about side effects. You may report side effects to FDA at 1-800-FDA-1088. °Where should I keep my medicine? °Keep out of the reach of children. °Store in a refrigerator between 2 and 8 degrees C (36 and 46 degrees F). Do not freeze or shake. Throw away any unused portion if using a single-dose vial. Multi-dose vials can be kept in the refrigerator for up to 21 days after the initial dose. Throw away unused medicine. °NOTE: This sheet is a summary. It may not cover all possible information. If you have questions about this medicine, talk to your doctor, pharmacist, or health care provider. °© 2015, [employer]. (2008-03-02 10:25:44) ° °

## 2014-08-26 DIAGNOSIS — L97412 Non-pressure chronic ulcer of right heel and midfoot with fat layer exposed: Secondary | ICD-10-CM | POA: Diagnosis not present

## 2014-08-26 DIAGNOSIS — L97421 Non-pressure chronic ulcer of left heel and midfoot limited to breakdown of skin: Secondary | ICD-10-CM | POA: Diagnosis not present

## 2014-08-26 DIAGNOSIS — R609 Edema, unspecified: Secondary | ICD-10-CM | POA: Diagnosis not present

## 2014-08-26 DIAGNOSIS — E10621 Type 1 diabetes mellitus with foot ulcer: Secondary | ICD-10-CM | POA: Diagnosis not present

## 2014-08-26 LAB — GLUCOSE, CAPILLARY
Glucose-Capillary: 176 mg/dL — ABNORMAL HIGH (ref 65–99)
Glucose-Capillary: 219 mg/dL — ABNORMAL HIGH (ref 65–99)
Glucose-Capillary: 184 mg/dL — ABNORMAL HIGH (ref 65–99)

## 2014-08-27 DIAGNOSIS — E10621 Type 1 diabetes mellitus with foot ulcer: Secondary | ICD-10-CM | POA: Diagnosis not present

## 2014-08-27 DIAGNOSIS — L97421 Non-pressure chronic ulcer of left heel and midfoot limited to breakdown of skin: Secondary | ICD-10-CM | POA: Diagnosis not present

## 2014-08-27 DIAGNOSIS — R609 Edema, unspecified: Secondary | ICD-10-CM | POA: Diagnosis not present

## 2014-08-27 DIAGNOSIS — L97412 Non-pressure chronic ulcer of right heel and midfoot with fat layer exposed: Secondary | ICD-10-CM | POA: Diagnosis not present

## 2014-08-31 ENCOUNTER — Encounter (HOSPITAL_COMMUNITY)
Admission: RE | Admit: 2014-08-31 | Payer: Medicaid Other | Source: Ambulatory Visit | Attending: Nephrology | Admitting: Nephrology

## 2014-08-31 ENCOUNTER — Encounter: Payer: Medicaid Other | Attending: Endocrinology | Admitting: Nutrition

## 2014-08-31 DIAGNOSIS — Z713 Dietary counseling and surveillance: Secondary | ICD-10-CM | POA: Diagnosis not present

## 2014-08-31 DIAGNOSIS — R609 Edema, unspecified: Secondary | ICD-10-CM | POA: Diagnosis not present

## 2014-08-31 DIAGNOSIS — E10621 Type 1 diabetes mellitus with foot ulcer: Secondary | ICD-10-CM | POA: Diagnosis not present

## 2014-08-31 DIAGNOSIS — L97412 Non-pressure chronic ulcer of right heel and midfoot with fat layer exposed: Secondary | ICD-10-CM | POA: Diagnosis not present

## 2014-08-31 DIAGNOSIS — Z794 Long term (current) use of insulin: Secondary | ICD-10-CM | POA: Insufficient documentation

## 2014-08-31 DIAGNOSIS — N189 Chronic kidney disease, unspecified: Secondary | ICD-10-CM | POA: Diagnosis not present

## 2014-08-31 DIAGNOSIS — L97421 Non-pressure chronic ulcer of left heel and midfoot limited to breakdown of skin: Secondary | ICD-10-CM | POA: Diagnosis not present

## 2014-08-31 DIAGNOSIS — E1022 Type 1 diabetes mellitus with diabetic chronic kidney disease: Secondary | ICD-10-CM | POA: Diagnosis present

## 2014-08-31 LAB — GLUCOSE, CAPILLARY
GLUCOSE-CAPILLARY: 352 mg/dL — AB (ref 65–99)
Glucose-Capillary: 191 mg/dL — ABNORMAL HIGH (ref 65–99)
Glucose-Capillary: 199 mg/dL — ABNORMAL HIGH (ref 65–99)

## 2014-08-31 NOTE — Progress Notes (Signed)
Patient reports low blood sugars during the night still.   Will do a correction at HS (MN), blood sugars in the 200s, , then drinks some juice and eats some protein, and says if she doesn't she will drop low.   Also on Friday, FBS was 182 at 8AM-- took no bolus, and blood sugar was 90 at the hyperbarric chamber for her foot ulcer.   Patient continues to go for days without eating/bolusing for meals, (yesterday-no bolus, no food, no blood sugars).  Did not test today as yet (4PM)  Did not eat.    Meter download shows blood sugars dropping 60 points from 8AM to 10AM Tuesday.  On Wednesday, her FBS was 207 at 8AM.  She put the pump in stop, and it was 170 at 1PM.    Plan:  Pump basal rate dropped 0.1 from MN to noon.  She will test tonight at HS, do a correction bolus, do not drink juice or eat, and do not put pump in stop.  She will call in the AM.   Meter download put on Dr. Ronnie Derby desk for review in the AM.

## 2014-08-31 NOTE — Progress Notes (Signed)
Also patient reports that she is "usually not bolusing for most meals, or will drop low.

## 2014-08-31 NOTE — Patient Instructions (Signed)
Test tonight at bedtime, correct blood sugar per pump, take no juice, or eat nothing, and do not put the pump in suspend.   Test blood sugars in the AM, and call me the results.

## 2014-09-01 ENCOUNTER — Encounter (HOSPITAL_COMMUNITY): Payer: Medicaid Other

## 2014-09-01 ENCOUNTER — Encounter (HOSPITAL_BASED_OUTPATIENT_CLINIC_OR_DEPARTMENT_OTHER): Payer: Medicare Other | Attending: Surgery

## 2014-09-01 ENCOUNTER — Inpatient Hospital Stay (HOSPITAL_COMMUNITY)
Admission: EM | Admit: 2014-09-01 | Discharge: 2014-09-06 | DRG: 872 | Disposition: A | Discharge status: Other Acute Inpatient Hospital | Payer: Medicare Other | Attending: Internal Medicine | Admitting: Internal Medicine

## 2014-09-01 ENCOUNTER — Telehealth: Payer: Self-pay | Admitting: Nutrition

## 2014-09-01 ENCOUNTER — Encounter (HOSPITAL_COMMUNITY): Payer: Self-pay | Admitting: Emergency Medicine

## 2014-09-01 ENCOUNTER — Inpatient Hospital Stay (HOSPITAL_COMMUNITY): Payer: Medicare Other

## 2014-09-01 DIAGNOSIS — Z87891 Personal history of nicotine dependence: Secondary | ICD-10-CM | POA: Diagnosis not present

## 2014-09-01 DIAGNOSIS — D72829 Elevated white blood cell count, unspecified: Secondary | ICD-10-CM | POA: Diagnosis present

## 2014-09-01 DIAGNOSIS — Z794 Long term (current) use of insulin: Secondary | ICD-10-CM | POA: Diagnosis not present

## 2014-09-01 DIAGNOSIS — N183 Chronic kidney disease, stage 3 unspecified: Secondary | ICD-10-CM

## 2014-09-01 DIAGNOSIS — J189 Pneumonia, unspecified organism: Secondary | ICD-10-CM

## 2014-09-01 DIAGNOSIS — R197 Diarrhea, unspecified: Secondary | ICD-10-CM | POA: Diagnosis not present

## 2014-09-01 DIAGNOSIS — N390 Urinary tract infection, site not specified: Secondary | ICD-10-CM | POA: Diagnosis present

## 2014-09-01 DIAGNOSIS — L03116 Cellulitis of left lower limb: Secondary | ICD-10-CM

## 2014-09-01 DIAGNOSIS — E872 Acidosis, unspecified: Secondary | ICD-10-CM

## 2014-09-01 DIAGNOSIS — M8580 Other specified disorders of bone density and structure, unspecified site: Secondary | ICD-10-CM | POA: Diagnosis present

## 2014-09-01 DIAGNOSIS — E1049 Type 1 diabetes mellitus with other diabetic neurological complication: Secondary | ICD-10-CM | POA: Diagnosis present

## 2014-09-01 DIAGNOSIS — M869 Osteomyelitis, unspecified: Secondary | ICD-10-CM | POA: Diagnosis present

## 2014-09-01 DIAGNOSIS — D62 Acute posthemorrhagic anemia: Secondary | ICD-10-CM

## 2014-09-01 DIAGNOSIS — R112 Nausea with vomiting, unspecified: Secondary | ICD-10-CM

## 2014-09-01 DIAGNOSIS — Z79891 Long term (current) use of opiate analgesic: Secondary | ICD-10-CM

## 2014-09-01 DIAGNOSIS — Z6820 Body mass index (BMI) 20.0-20.9, adult: Secondary | ICD-10-CM | POA: Diagnosis not present

## 2014-09-01 DIAGNOSIS — Z881 Allergy status to other antibiotic agents status: Secondary | ICD-10-CM

## 2014-09-01 DIAGNOSIS — Z299 Encounter for prophylactic measures, unspecified: Secondary | ICD-10-CM

## 2014-09-01 DIAGNOSIS — L97509 Non-pressure chronic ulcer of other part of unspecified foot with unspecified severity: Secondary | ICD-10-CM

## 2014-09-01 DIAGNOSIS — I129 Hypertensive chronic kidney disease with stage 1 through stage 4 chronic kidney disease, or unspecified chronic kidney disease: Secondary | ICD-10-CM | POA: Diagnosis present

## 2014-09-01 DIAGNOSIS — E10621 Type 1 diabetes mellitus with foot ulcer: Secondary | ICD-10-CM | POA: Diagnosis present

## 2014-09-01 DIAGNOSIS — R609 Edema, unspecified: Secondary | ICD-10-CM | POA: Diagnosis present

## 2014-09-01 DIAGNOSIS — L97519 Non-pressure chronic ulcer of other part of right foot with unspecified severity: Secondary | ICD-10-CM | POA: Diagnosis present

## 2014-09-01 DIAGNOSIS — A419 Sepsis, unspecified organism: Principal | ICD-10-CM

## 2014-09-01 DIAGNOSIS — R6520 Severe sepsis without septic shock: Secondary | ICD-10-CM | POA: Diagnosis present

## 2014-09-01 DIAGNOSIS — E440 Moderate protein-calorie malnutrition: Secondary | ICD-10-CM | POA: Diagnosis present

## 2014-09-01 DIAGNOSIS — Z9641 Presence of insulin pump (external) (internal): Secondary | ICD-10-CM | POA: Insufficient documentation

## 2014-09-01 DIAGNOSIS — E1065 Type 1 diabetes mellitus with hyperglycemia: Secondary | ICD-10-CM | POA: Diagnosis present

## 2014-09-01 DIAGNOSIS — E7800 Pure hypercholesterolemia, unspecified: Secondary | ICD-10-CM

## 2014-09-01 DIAGNOSIS — M1990 Unspecified osteoarthritis, unspecified site: Secondary | ICD-10-CM | POA: Diagnosis present

## 2014-09-01 DIAGNOSIS — M609 Myositis, unspecified: Secondary | ICD-10-CM | POA: Diagnosis present

## 2014-09-01 DIAGNOSIS — E10610 Type 1 diabetes mellitus with diabetic neuropathic arthropathy: Secondary | ICD-10-CM | POA: Diagnosis present

## 2014-09-01 DIAGNOSIS — Z79899 Other long term (current) drug therapy: Secondary | ICD-10-CM | POA: Diagnosis not present

## 2014-09-01 DIAGNOSIS — N179 Acute kidney failure, unspecified: Secondary | ICD-10-CM

## 2014-09-01 DIAGNOSIS — B9620 Unspecified Escherichia coli [E. coli] as the cause of diseases classified elsewhere: Secondary | ICD-10-CM | POA: Diagnosis present

## 2014-09-01 DIAGNOSIS — E13621 Other specified diabetes mellitus with foot ulcer: Secondary | ICD-10-CM

## 2014-09-01 DIAGNOSIS — L97512 Non-pressure chronic ulcer of other part of right foot with fat layer exposed: Secondary | ICD-10-CM | POA: Insufficient documentation

## 2014-09-01 DIAGNOSIS — L97529 Non-pressure chronic ulcer of other part of left foot with unspecified severity: Secondary | ICD-10-CM | POA: Diagnosis present

## 2014-09-01 DIAGNOSIS — E11621 Type 2 diabetes mellitus with foot ulcer: Secondary | ICD-10-CM

## 2014-09-01 DIAGNOSIS — A047 Enterocolitis due to Clostridium difficile: Secondary | ICD-10-CM | POA: Diagnosis present

## 2014-09-01 DIAGNOSIS — D509 Iron deficiency anemia, unspecified: Secondary | ICD-10-CM

## 2014-09-01 DIAGNOSIS — E1069 Type 1 diabetes mellitus with other specified complication: Secondary | ICD-10-CM | POA: Diagnosis present

## 2014-09-01 DIAGNOSIS — M009 Pyogenic arthritis, unspecified: Secondary | ICD-10-CM | POA: Diagnosis present

## 2014-09-01 DIAGNOSIS — E11610 Type 2 diabetes mellitus with diabetic neuropathic arthropathy: Secondary | ICD-10-CM | POA: Diagnosis not present

## 2014-09-01 DIAGNOSIS — M79609 Pain in unspecified limb: Secondary | ICD-10-CM | POA: Diagnosis not present

## 2014-09-01 DIAGNOSIS — L03115 Cellulitis of right lower limb: Secondary | ICD-10-CM | POA: Diagnosis present

## 2014-09-01 DIAGNOSIS — J302 Other seasonal allergic rhinitis: Secondary | ICD-10-CM | POA: Diagnosis present

## 2014-09-01 DIAGNOSIS — N189 Chronic kidney disease, unspecified: Secondary | ICD-10-CM

## 2014-09-01 DIAGNOSIS — IMO0002 Reserved for concepts with insufficient information to code with codable children: Secondary | ICD-10-CM

## 2014-09-01 DIAGNOSIS — L97521 Non-pressure chronic ulcer of other part of left foot limited to breakdown of skin: Secondary | ICD-10-CM | POA: Insufficient documentation

## 2014-09-01 DIAGNOSIS — IMO0001 Reserved for inherently not codable concepts without codable children: Secondary | ICD-10-CM

## 2014-09-01 LAB — URINALYSIS, ROUTINE W REFLEX MICROSCOPIC
Bilirubin Urine: NEGATIVE
Glucose, UA: 250 mg/dL — AB
Ketones, ur: NEGATIVE mg/dL
Nitrite: POSITIVE — AB
Protein, ur: 100 mg/dL — AB
Specific Gravity, Urine: 1.012 (ref 1.005–1.030)
Urobilinogen, UA: 0.2 mg/dL (ref 0.0–1.0)
pH: 5 (ref 5.0–8.0)

## 2014-09-01 LAB — CBC WITH DIFFERENTIAL/PLATELET
Basophils Absolute: 0 10*3/uL (ref 0.0–0.1)
Basophils Relative: 0 % (ref 0–1)
Eosinophils Absolute: 0 10*3/uL (ref 0.0–0.7)
Eosinophils Relative: 0 % (ref 0–5)
HCT: 36.6 % (ref 36.0–46.0)
Hemoglobin: 12 g/dL (ref 12.0–15.0)
Lymphocytes Relative: 2 % — ABNORMAL LOW (ref 12–46)
Lymphs Abs: 0.4 10*3/uL — ABNORMAL LOW (ref 0.7–4.0)
MCH: 27.9 pg (ref 26.0–34.0)
MCHC: 32.8 g/dL (ref 30.0–36.0)
MCV: 85.1 fL (ref 78.0–100.0)
Monocytes Absolute: 0.6 10*3/uL (ref 0.1–1.0)
Monocytes Relative: 3 % (ref 3–12)
Neutro Abs: 18.8 10*3/uL — ABNORMAL HIGH (ref 1.7–7.7)
Neutrophils Relative %: 95 % — ABNORMAL HIGH (ref 43–77)
Platelets: 539 10*3/uL — ABNORMAL HIGH (ref 150–400)
RBC: 4.3 MIL/uL (ref 3.87–5.11)
RDW: 16 % — ABNORMAL HIGH (ref 11.5–15.5)
WBC: 19.9 10*3/uL — ABNORMAL HIGH (ref 4.0–10.5)

## 2014-09-01 LAB — COMPREHENSIVE METABOLIC PANEL
ALT: 55 U/L — ABNORMAL HIGH (ref 14–54)
AST: 31 U/L (ref 15–41)
Albumin: 2.7 g/dL — ABNORMAL LOW (ref 3.5–5.0)
Alkaline Phosphatase: 161 U/L — ABNORMAL HIGH (ref 38–126)
Anion gap: 13 (ref 5–15)
BUN: 48 mg/dL — ABNORMAL HIGH (ref 6–20)
CO2: 12 mmol/L — ABNORMAL LOW (ref 22–32)
Calcium: 8 mg/dL — ABNORMAL LOW (ref 8.9–10.3)
Chloride: 109 mmol/L (ref 101–111)
Creatinine, Ser: 1.88 mg/dL — ABNORMAL HIGH (ref 0.44–1.00)
GFR calc Af Amer: 37 mL/min — ABNORMAL LOW (ref >60)
GFR calc non Af Amer: 32 mL/min — ABNORMAL LOW (ref >60)
Glucose, Bld: 358 mg/dL — ABNORMAL HIGH (ref 65–99)
Potassium: 4 mmol/L (ref 3.5–5.1)
Sodium: 134 mmol/L — ABNORMAL LOW (ref 135–145)
Total Bilirubin: 0.4 mg/dL (ref 0.3–1.2)
Total Protein: 6.7 g/dL (ref 6.5–8.1)

## 2014-09-01 LAB — GLUCOSE, CAPILLARY
GLUCOSE-CAPILLARY: 129 mg/dL — AB (ref 65–99)
Glucose-Capillary: 106 mg/dL — ABNORMAL HIGH (ref 65–99)
Glucose-Capillary: 228 mg/dL — ABNORMAL HIGH (ref 65–99)
Glucose-Capillary: 291 mg/dL — ABNORMAL HIGH (ref 65–99)

## 2014-09-01 LAB — CBG MONITORING, ED: Glucose-Capillary: 334 mg/dL — ABNORMAL HIGH (ref 65–99)

## 2014-09-01 LAB — URINE MICROSCOPIC-ADD ON

## 2014-09-01 LAB — PROTIME-INR
INR: 1.65 — AB (ref 0.00–1.49)
Prothrombin Time: 19.6 seconds — ABNORMAL HIGH (ref 11.6–15.2)

## 2014-09-01 LAB — I-STAT CG4 LACTIC ACID, ED
Lactic Acid, Venous: 1.3 mmol/L (ref 0.5–2.0)
Lactic Acid, Venous: 1.47 mmol/L (ref 0.5–2.0)

## 2014-09-01 LAB — PROCALCITONIN: PROCALCITONIN: 33.73 ng/mL

## 2014-09-01 LAB — PREGNANCY, URINE: Preg Test, Ur: NEGATIVE

## 2014-09-01 LAB — APTT: APTT: 41 s — AB (ref 24–37)

## 2014-09-01 LAB — LACTIC ACID, PLASMA: LACTIC ACID, VENOUS: 1.7 mmol/L (ref 0.5–2.0)

## 2014-09-01 LAB — LIPASE, BLOOD: Lipase: 11 U/L — ABNORMAL LOW (ref 22–51)

## 2014-09-01 MED ORDER — SODIUM CHLORIDE 0.9 % IV BOLUS (SEPSIS)
1000.0000 mL | Freq: Once | INTRAVENOUS | Status: AC
  Administered 2014-09-01: 1000 mL via INTRAVENOUS

## 2014-09-01 MED ORDER — CALCITRIOL 0.25 MCG PO CAPS
0.2500 ug | ORAL_CAPSULE | ORAL | Status: DC
  Administered 2014-09-01 – 2014-09-06 (×3): 0.25 ug via ORAL
  Filled 2014-09-01 (×3): qty 1

## 2014-09-01 MED ORDER — SODIUM CHLORIDE 0.9 % IJ SOLN
3.0000 mL | Freq: Two times a day (BID) | INTRAMUSCULAR | Status: DC
  Administered 2014-09-02: 3 mL via INTRAVENOUS

## 2014-09-01 MED ORDER — FERROUS SULFATE 325 (65 FE) MG PO TABS
325.0000 mg | ORAL_TABLET | Freq: Two times a day (BID) | ORAL | Status: DC
  Administered 2014-09-01 – 2014-09-06 (×10): 325 mg via ORAL
  Filled 2014-09-01 (×11): qty 1

## 2014-09-01 MED ORDER — FOLIC ACID 1 MG PO TABS
1.0000 mg | ORAL_TABLET | Freq: Every day | ORAL | Status: DC
  Administered 2014-09-02 – 2014-09-06 (×5): 1 mg via ORAL
  Filled 2014-09-01 (×5): qty 1

## 2014-09-01 MED ORDER — VITAMIN B-6 100 MG PO TABS
100.0000 mg | ORAL_TABLET | Freq: Every day | ORAL | Status: DC
  Administered 2014-09-02 – 2014-09-06 (×5): 100 mg via ORAL
  Filled 2014-09-01 (×5): qty 1

## 2014-09-01 MED ORDER — ONDANSETRON HCL 4 MG PO TABS: 4.0000 mg | ORAL_TABLET | Freq: Four times a day (QID) | ORAL | Status: DC | PRN

## 2014-09-01 MED ORDER — ACETAMINOPHEN 650 MG RE SUPP
650.0000 mg | Freq: Four times a day (QID) | RECTAL | Status: DC | PRN
Start: 2014-09-01 — End: 2014-09-06

## 2014-09-01 MED ORDER — HYDROMORPHONE HCL 1 MG/ML IJ SOLN
1.0000 mg | INTRAMUSCULAR | Status: DC | PRN
  Administered 2014-09-01 – 2014-09-02 (×3): 1 mg via INTRAVENOUS
  Filled 2014-09-01 (×3): qty 1

## 2014-09-01 MED ORDER — SODIUM CHLORIDE 0.9 % IV SOLN
INTRAVENOUS | Status: DC
  Administered 2014-09-01 – 2014-09-05 (×7): via INTRAVENOUS

## 2014-09-01 MED ORDER — ALBUTEROL SULFATE (2.5 MG/3ML) 0.083% IN NEBU: 3.0000 mL | INHALATION_SOLUTION | Freq: Four times a day (QID) | RESPIRATORY_TRACT | Status: DC | PRN

## 2014-09-01 MED ORDER — CEFTRIAXONE SODIUM IN DEXTROSE 20 MG/ML IV SOLN
1.0000 g | INTRAVENOUS | Status: DC
  Administered 2014-09-02 – 2014-09-03 (×2): 1 g via INTRAVENOUS
  Filled 2014-09-01 (×3): qty 50

## 2014-09-01 MED ORDER — ONDANSETRON HCL 4 MG/2ML IJ SOLN
4.0000 mg | Freq: Four times a day (QID) | INTRAMUSCULAR | Status: DC | PRN
  Administered 2014-09-01: 4 mg via INTRAVENOUS
  Filled 2014-09-01 (×2): qty 2

## 2014-09-01 MED ORDER — ALUM & MAG HYDROXIDE-SIMETH 200-200-20 MG/5ML PO SUSP: 30.0000 mL | Freq: Four times a day (QID) | ORAL | Status: DC | PRN

## 2014-09-01 MED ORDER — ACETAMINOPHEN 325 MG PO TABS
650.0000 mg | ORAL_TABLET | Freq: Four times a day (QID) | ORAL | Status: DC | PRN
  Administered 2014-09-01 – 2014-09-02 (×2): 650 mg via ORAL
  Filled 2014-09-01 (×2): qty 2

## 2014-09-01 MED ORDER — INSULIN ASPART 100 UNIT/ML ~~LOC~~ SOLN
10.0000 [IU] | Freq: Once | SUBCUTANEOUS | Status: DC
  Filled 2014-09-01: qty 1

## 2014-09-01 MED ORDER — DEXTROSE 5 % IV SOLN
1.0000 g | Freq: Once | INTRAVENOUS | Status: AC
  Administered 2014-09-01: 1 g via INTRAVENOUS
  Filled 2014-09-01: qty 10

## 2014-09-01 MED ORDER — VANCOMYCIN HCL IN DEXTROSE 1-5 GM/200ML-% IV SOLN
1000.0000 mg | INTRAVENOUS | Status: DC
  Administered 2014-09-02 – 2014-09-03 (×2): 1000 mg via INTRAVENOUS
  Filled 2014-09-01 (×3): qty 200

## 2014-09-01 MED ORDER — HEPARIN SODIUM (PORCINE) 5000 UNIT/ML IJ SOLN
5000.0000 [IU] | Freq: Three times a day (TID) | INTRAMUSCULAR | Status: DC
  Administered 2014-09-01: 5000 [IU] via SUBCUTANEOUS
  Filled 2014-09-01 (×15): qty 1

## 2014-09-01 MED ORDER — INSULIN PUMP
Freq: Three times a day (TID) | SUBCUTANEOUS | Status: DC
  Administered 2014-09-01 – 2014-09-04 (×9): via SUBCUTANEOUS
  Administered 2014-09-05: 1.35 via SUBCUTANEOUS
  Administered 2014-09-05: 08:00:00 via SUBCUTANEOUS
  Filled 2014-09-01: qty 1

## 2014-09-01 MED ORDER — ONDANSETRON HCL 4 MG/2ML IJ SOLN
4.0000 mg | Freq: Once | INTRAMUSCULAR | Status: DC
  Filled 2014-09-01: qty 2

## 2014-09-01 MED ORDER — PRAVASTATIN SODIUM 20 MG PO TABS
20.0000 mg | ORAL_TABLET | Freq: Every day | ORAL | Status: DC
  Administered 2014-09-02 – 2014-09-05 (×4): 20 mg via ORAL
  Filled 2014-09-01 (×6): qty 1

## 2014-09-01 MED ORDER — VITAMIN B-12 100 MCG PO TABS
100.0000 ug | ORAL_TABLET | Freq: Every day | ORAL | Status: DC
  Administered 2014-09-02 – 2014-09-06 (×5): 100 ug via ORAL
  Filled 2014-09-01 (×5): qty 1

## 2014-09-01 MED ORDER — HYDROCODONE-ACETAMINOPHEN 5-325 MG PO TABS
1.0000 | ORAL_TABLET | ORAL | Status: DC | PRN
  Administered 2014-09-03: 2 via ORAL
  Filled 2014-09-01: qty 2

## 2014-09-01 MED ORDER — HYDROMORPHONE HCL 1 MG/ML IJ SOLN
1.0000 mg | Freq: Once | INTRAMUSCULAR | Status: AC
  Administered 2014-09-01: 1 mg via INTRAVENOUS
  Filled 2014-09-01: qty 1

## 2014-09-01 MED ORDER — VANCOMYCIN HCL 10 G IV SOLR
1250.0000 mg | INTRAVENOUS | Status: AC
  Administered 2014-09-01: 1250 mg via INTRAVENOUS
  Filled 2014-09-01: qty 1250

## 2014-09-01 NOTE — ED Notes (Signed)
Pt unable to give stool sample at this time.

## 2014-09-01 NOTE — Progress Notes (Signed)
Utilization Review completed.  Jermale Crass RN CM  

## 2014-09-01 NOTE — ED Notes (Signed)
Pt did not obtain urine specimen in the restroom she stated " they didn't tell me to get one and I can only pee if I have something to drink. I just vomited everything up."

## 2014-09-01 NOTE — Progress Notes (Signed)
ANTIBIOTIC CONSULT NOTE - INITIAL  Pharmacy Consult for Vancomycin Indication: Cellulitis  Allergies  Allergen Reactions  . Ciprofloxacin Itching  . Cleocin [Clindamycin Hcl]     Diarrhea     Patient Measurements:   Height: 5'5" Weight: 60 kg (08/16/14)  Vital Signs: Temp: 99.2 F (37.3 C) (06/01 0950) Temp Source: Oral (06/01 0950) BP: 154/101 mmHg (06/01 0950) Pulse Rate: 130 (06/01 0950) Intake/Output from previous day:   Intake/Output from this shift: Total I/O In: 1250 [I.V.:1250] Out: -   Labs:  Recent Labs  09/01/14 1023  WBC 19.9*  HGB 12.0  PLT 539*  CREATININE 1.88*   CrCl ~43 ml/min/1.19m2 (normalized)  CrCl cannot be calculated (Unknown ideal weight.). No results for input(s): VANCOTROUGH, VANCOPEAK, VANCORANDOM, GENTTROUGH, GENTPEAK, GENTRANDOM, TOBRATROUGH, TOBRAPEAK, TOBRARND, AMIKACINPEAK, AMIKACINTROU, AMIKACIN in the last 72 hours.   Microbiology: No results found for this or any previous visit (from the past 720 hour(s)).  Medical History: Past Medical History  Diagnosis Date  . Diabetes mellitus   . Neuropathy in diabetes   . Hypertension   . Hypercholesteremia   . H/O seasonal allergies   . Anemia   . Left foot infection     Medications:  Scheduled:  . ondansetron (ZOFRAN) IV  4 mg Intravenous Once   Infusions:  . vancomycin 1,250 mg (09/01/14 1234)   PRN:   Assessment: 44 yo female with type 1 DM presents with n/v and LLE pain. Pt followed at wound card center for b/l feet ulcerations. Pharmacy is consulted to dose vancomycin for cellulitis.  Goal of Therapy:  Vancomycin trough level 10-15 mcg/ml  Plan:   Vancomycin 1250mg  IV x 1, then 1g IV q24h Check trough at steady state Follow up renal function & cultures if available, clinical course  Peggyann Juba, PharmD, BCPS Pager: [phone] 09/01/2014,12:50 PM

## 2014-09-01 NOTE — ED Notes (Signed)
Patient sent from wound care center. Patient c/o vomiting that started this am and diarrhea x4-5 days. Patient received wound care this am, referring MD states he does not believe this is wound related. Emesis too numerous to count, states she has not had diarrhea this am. Patient c/o generalized pain.

## 2014-09-01 NOTE — H&P (Signed)
History and Physical        Hospital Admission Note Date: 09/01/2014  Patient name: [name] Medical record number: [id number] Date of birth: Jun 26, 1970 Age: 44 y.o. Gender: female  PCP: Hayden Rasmussen., MD  Referring physician: Dr Wilson Singer   Chief Complaint:  Nausea, vomiting, diarrhea for last 4 days  Left lower extremity erythema since this morning   HPI: Patient is a 44 year old female with type 1 diabetes mellitus on insulin pump, diabetic neuropathy, bilateral foot ulcers, left foot ulcer, currently under treatment with a wound VAC Presented to ED from the wound care center. Patient reported that she was having diarrhea for last 4-5 days, She was on doxycycline for the left foot wound, finished 3 weeks ago. She denied any abdominal pain however had nausea and vomiting, multiple episodes this morning. Patient reports that the diarrhea has almost resolved since last night. She went to the wound care center today and was also noticed to have left lower extremity erythema and pain which was new. She has a left foot ulcer as well which is being treated.   Patient also reports that for the past 1 week urine has been cloudy, increased frequency and urgency and has been drinking cranberry juice. She reported subjective fevers and chills. In ED, patient was noticed to have tachycardia with heart rate of 130, BP 199/52, WBCs 19.9, creatinine 1.8, glucose 358, UA positive for UTI   Review of Systems:  Constitutional: +fever, chills, diaphoresis, poor appetite and fatigue.  HEENT: Denies photophobia, eye pain, redness, hearing loss, ear pain, congestion, sore throat, rhinorrhea, sneezing, mouth sores, trouble swallowing, neck pain, neck stiffness and tinnitus.   Respiratory: Denies SOB, DOE, cough, chest tightness,  and wheezing.   Cardiovascular: Denies chest pain, palpitations  and leg swelling.  Gastrointestinal: please see history of present illness  Genitourinary: + dysuria, urgency, frequency, hematuria, flank pain and difficulty urinating.  Musculoskeletal: Denies myalgias, back pain, joint swelling, arthralgias and gait problem. + bilateral foot ulcers, right foot ulcer being treated with wound VAC, left lower extreme the cellulitis as per HPI Skin: Denies pallor, rash and wound.  Neurological: Denies dizziness, seizures, syncope,+ weakness, light-headedness, numbness and headaches.  Hematological: Denies adenopathy. Easy bruising, personal or family bleeding history  Psychiatric/Behavioral: Denies suicidal ideation, mood changes, confusion, nervousness, sleep disturbance and agitation  Past Medical History: Past Medical History  Diagnosis Date  . Diabetes mellitus   . Neuropathy in diabetes   . Hypertension   . Hypercholesteremia   . H/O seasonal allergies   . Anemia   . Left foot infection     Past Surgical History  Procedure Laterality Date  . Cesarean section    . Eye surgery    . Hemrrhoidectomy    . Peripherally inserted central catheter insertion    . I&d extremity Right 06/10/2014    Procedure: IRRIGATION AND DEBRIDEMENT Right Foot;  Surgeon: Newt Minion, MD;  Location: [place];  Service: Orthopedics;  Laterality: Right;  . Skin split graft Right 06/15/2014    Procedure: SPLIT THICKNESS SKIN GRAFT RIGHT FOOT;  Surgeon: Newt Minion, MD;  Location: [place];  Service: Orthopedics;  Laterality: Right;  Medications: Prior to Admission medications   Medication Sig Start Date End Date Taking? Authorizing Provider  albuterol (PROVENTIL HFA;VENTOLIN HFA) 108 (90 BASE) MCG/ACT inhaler Inhale 2 puffs into the lungs every 6 (six) hours as needed. S.O.B.   Yes Historical Provider, MD  calcitRIOL (ROCALTROL) 0.25 MCG capsule Take 0.25 mcg by mouth. M,W,F   Yes Historical Provider, MD  CALCIUM PO Take 1 tablet by mouth daily.   Yes Historical Provider,  MD  ferrous sulfate 325 (65 FE) MG tablet Take 325 mg by mouth 2 (two) times daily with a meal.  11/23/13  Yes Historical Provider, MD  folic acid (FOLVITE) 1 MG tablet Take 1 mg by mouth daily.   Yes Historical Provider, MD  furosemide (LASIX) 40 MG tablet Take 1 tablet by mouth daily. 08/11/14  Yes Historical Provider, MD  insulin aspart (NOVOLOG) 100 UNIT/ML injection Use max 65 units per day with pump Patient taking differently: For every 17g of carbs = 1 unit of insulinUse max 65 units per day with pump 09/02/13  Yes Elayne Snare, MD  Omega-3 Fatty Acids (FISH OIL PO) Take 1 capsule by mouth daily.   Yes Historical Provider, MD  pravastatin (PRAVACHOL) 40 MG tablet TAKE 1/2 TABLETS (20 MG TOTAL) BY MOUTH DAILY. 07/26/14  Yes Elayne Snare, MD  pyridoxine (B-6) 100 MG tablet Take 100 mg by mouth daily.   Yes Historical Provider, MD  vitamin B-12 (CYANOCOBALAMIN) 100 MCG tablet Take 100 mcg by mouth daily.   Yes Historical Provider, MD  doxycycline (VIBRA-TABS) 100 MG tablet Take 1 tablet (100 mg total) by mouth every 12 (twelve) hours. Patient not taking: Reported on 09/01/2014 06/18/14   Delfina Redwood, MD  loperamide (IMODIUM) 2 MG capsule As directed for diarrhea. Over the counter. Patient not taking: Reported on 09/01/2014 06/18/14   Delfina Redwood, MD  oxyCODONE-acetaminophen (ROXICET) 5-325 MG per tablet Take 1 tablet by mouth every 4 (four) hours as needed for severe pain. Patient not taking: Reported on 09/01/2014 06/18/14   Newt Minion, MD  silver sulfADIAZINE (SILVADENE) 1 % cream Apply 1 application topically daily. Apply Silvadene plus dry dressing to right and left foot wounds change daily Patient not taking: Reported on 09/01/2014 06/18/14   Newt Minion, MD  sodium bicarbonate 650 MG tablet Take 1 tablet (650 mg total) by mouth 3 (three) times daily. Patient not taking: Reported on 09/01/2014 06/15/13   Bonnielee Haff, MD    Allergies:   Allergies  Allergen Reactions  . Ciprofloxacin  Itching  . Cleocin [Clindamycin Hcl]     Diarrhea     Social History:  reports that she quit smoking about 6 years ago. She has never used smokeless tobacco. She reports that she drinks about 0.5 oz of alcohol per week. She reports that she does not use illicit drugs. she lives at home with 2 roommates   Family History: Denies any history of CAD or CKD in family  Physical Exam: Blood pressure 99/52, pulse 98, temperature 99.2 F (37.3 C), temperature source Oral, resp. rate 18, last menstrual period 07/13/2014, SpO2 100 %. General: Alert, awake, oriented x3, in no acute distress. HEENT: normocephalic, atraumatic, anicteric sclera, pink conjunctiva, pupils equal and reactive to light and accomodation, oropharynx clear Neck: supple, no masses or lymphadenopathy, no goiter, no bruits  Heart: Regular rate and rhythm, without murmurs, rubs or gallops. Lungs: Clear to auscultation bilaterally, no wheezing, rales or rhonchi. Abdomen: Soft, nontender, nondistended, positive bowel sounds, no masses.insulin  pump Extremities: No clubbing, cyanosis or edema with positive pedal pulses. right foot wound with wound VAC, left foot with ulceration and the plantar aspect, increased erythema, tenderness left lower extremity from below knee to the foot Neuro: Grossly intact, no focal neurological deficits, strength 5/5 upper and lower extremities bilaterally Psych: alert and oriented x 3, normal mood and affect Skin:  right foot wound with wound VAC, left foot with ulceration and the plantar aspect, increased erythema, tenderness left lower extremity from below knee to the foot    LABS on Admission:  Basic Metabolic Panel:  Recent Labs Lab 09/01/14 1023  NA 134*  K 4.0  CL 109  CO2 12*  GLUCOSE 358*  BUN 48*  CREATININE 1.88*  CALCIUM 8.0*   Liver Function Tests:  Recent Labs Lab 09/01/14 1023  AST 31  ALT 55*  ALKPHOS 161*  BILITOT 0.4  PROT 6.7  ALBUMIN 2.7*    Recent Labs Lab  09/01/14 1023  LIPASE 11*   No results for input(s): AMMONIA in the last 168 hours. CBC:  Recent Labs Lab 09/01/14 1023  WBC 19.9*  NEUTROABS 18.8*  HGB 12.0  HCT 36.6  MCV 85.1  PLT 539*   Cardiac Enzymes: No results for input(s): CKTOTAL, CKMB, CKMBINDEX, TROPONINI in the last 168 hours. BNP: Invalid input(s): POCBNP CBG:  Recent Labs Lab 09/01/14 0818 09/01/14 0928  GLUCAP 106* 228*    Radiological Exams on Admission:  No results found.  *I have personally reviewed the images above*    Assessment/Plan Principal Problem:   Sepsis with metabolic acidosis, leukocytosis:  - Obtain pro-calcitonin, blood cultures, stool cultures, rule out C. difficile - Placed on IV vancomycin, for left lower extremity cellulitis, IV Rocephin for UTI - Follow urine cultures closely  Active Problems:   Type 1 diabetes mellitus with neurological manifestations, uncontrolled - Patient wants to continue with insulin pump, placed orders, for now placed on full liquid diet -Obtain hemoglobin A 1C     Charcot foot due to diabetes mellitus diabetic ulcer on the right foot currently with a wound VAC - Placed wound care consult      UTI (urinary tract infection) -Follow urine culture and sensitivities, continue IV Rocephin  Mild AK I on  CKD (chronic kidney disease), stage III: Likely due to #1  -   placed on IV fluid hydration, baseline creatinine 1.7   Left lower extremity cellulitis  - Placed on IV vancomycin   nausea, vomiting, diarrhea  - Obtain stool cultures, rule out C. difficile, patient was on antibiotics, finished 3 weeks ago  - continue IV fluid hydration,    DVT prophylaxis:  heparin subcutaneous   CODE STATUS:  full CODE STATUS  Family Communication: Admission, patients condition and plan of care including tests being ordered have been discussed with the patient  who indicates understanding and agree with the plan and Code Status  Disposition plan: Further  plan will depend as patient's clinical course evolves and further radiologic and laboratory data become available.   Time Spent on Admission: 60 mins   RAI,RIPUDEEP M.D. [facility] 09/01/2014, 3:45 PM Pager: [phone]  If 7PM-7AM, please contact night-coverage [email or web address] Password [email or web address]

## 2014-09-01 NOTE — Telephone Encounter (Signed)
Blood sugar last night at 10PM was 180.  Had not eaten anything since 12Noon- no bolus, no IOB.  At 7AM this morning, Blood sugar was 50.  She drank 1 cup of juice and ate a carton of  Yogurt.  Few min. Later, she started vomiting--she says she has vomited 10 times, feeling very chilled, but no fever, and went to her appt. At the hyperbaric chamber.  Her blood sugar was 269.  She was very chilled, and they sent her to the ER.  She is there now.   Basal rates:  MN: 0.225, 5AM: 0.250

## 2014-09-01 NOTE — ED Notes (Signed)
MD at bedside. 

## 2014-09-01 NOTE — Progress Notes (Signed)
Inpatient Diabetes Program Recommendations  AACE/ADA: New Consensus Statement on Inpatient Glycemic Control (2013)  Target Ranges:  Prepandial:   less than 140 mg/dL      Peak postprandial:   less than 180 mg/dL (1-2 hours)      Critically ill patients:  140 - 180 mg/dL    Paged by RN before 5PM.  Had questions about patient with insulin pump.  Reviewed Insulin pump orders with RN and documentation responsibilities with RN as well.  Per RN, patient is A&O and able to independently manage insulin pump.  Will have DM Coordinator follow up with patient tomorrow.   Wyn Quaker RN, MSN, CDE Diabetes Coordinator Inpatient Diabetes Program Team Pager: [phone] (8a-5p)

## 2014-09-01 NOTE — Progress Notes (Signed)
EDCM spoke to patient at bedside.  Patient reports she lives at home with two room mates, one of which is leaving.  Patient confirms she has home health services with [facility] for wound care to right foot.  Patient has a wound vac to right foot.  Patient reports she has a Corporate investment banker and shower bench at home.  Patient reports she goes to the wound care center once a week and goes to the "hyperbaric chamber" five days a week for two hours.  Patient uses Medicaid transport to get to wound care center.  Patient reports she is able to perform her ADL's without difficulty.  Patient reports he friend takes her food shopping once a week and her sister or her friend picks up her medications from the pharmacy.  Patient is diabetic. Patient interested in private duty nursing services.  EDCM provided patient with list of private duty nursing agencies.  Patient thankful for services.  No further EDCM needs at this time.

## 2014-09-01 NOTE — Telephone Encounter (Signed)
Phoned patient. Told her as below, and reminded her that each meal needs some protein.  She said she has been admitted to the hospital for 3 days. Stressed the need to have this snack every night.  She reported good understanding of this.

## 2014-09-01 NOTE — ED Notes (Signed)
Attempted to collect blood cultures and was unsuccessful.  I made nurse aware.

## 2014-09-01 NOTE — ED Provider Notes (Signed)
CSN: [id number]     Arrival date & time 09/01/14  0945 History   First MD Initiated Contact with Patient 09/01/14 1010     Chief Complaint  Patient presents with  . Emesis  . Diarrhea     (Consider location/radiation/quality/duration/timing/severity/associated sxs/prior Treatment) HPI   43yF with n/v and LLE pain. Pt followed at wound card center for b/l feet ulcerations. In past day developed n/v and unable to keep anything down. Today with increasing pain in LLE and redness around shin. Denies trauma. Has felt chilled at times. No respiratory complaints. For past week urine has been very cloudy so has been drinking cranberry juice.   Past Medical History  Diagnosis Date  . Diabetes mellitus   . Neuropathy in diabetes   . Hypertension   . Hypercholesteremia   . H/O seasonal allergies   . Anemia   . Left foot infection    Past Surgical History  Procedure Laterality Date  . Cesarean section    . Eye surgery    . Hemrrhoidectomy    . Peripherally inserted central catheter insertion    . I&d extremity Right 06/10/2014    Procedure: IRRIGATION AND DEBRIDEMENT Right Foot;  Surgeon: Newt Minion, MD;  Location: [place];  Service: Orthopedics;  Laterality: Right;  . Skin split graft Right 06/15/2014    Procedure: SPLIT THICKNESS SKIN GRAFT RIGHT FOOT;  Surgeon: Newt Minion, MD;  Location: [place];  Service: Orthopedics;  Laterality: Right;   History reviewed. No pertinent family history. History  Substance Use Topics  . Smoking status: Former Smoker    Quit date: 05/16/2008  . Smokeless tobacco: Never Used  . Alcohol Use: 0.5 oz/week    1 drink(s) per week     Comment: Occasional   OB History    No data available     Review of Systems  All systems reviewed and negative, other than as noted in HPI.   Allergies  Ciprofloxacin and Cleocin  Home Medications   Prior to Admission medications   Medication Sig Start Date End Date Taking? Authorizing Provider  albuterol  (PROVENTIL HFA;VENTOLIN HFA) 108 (90 BASE) MCG/ACT inhaler Inhale 2 puffs into the lungs every 6 (six) hours as needed. S.O.B.   Yes Historical Provider, MD  calcitRIOL (ROCALTROL) 0.25 MCG capsule Take 0.25 mcg by mouth. M,W,F   Yes Historical Provider, MD  CALCIUM PO Take 1 tablet by mouth daily.   Yes Historical Provider, MD  ferrous sulfate 325 (65 FE) MG tablet Take 325 mg by mouth 2 (two) times daily with a meal.  11/23/13  Yes Historical Provider, MD  folic acid (FOLVITE) 1 MG tablet Take 1 mg by mouth daily.   Yes Historical Provider, MD  furosemide (LASIX) 40 MG tablet Take 1 tablet by mouth daily. 08/11/14  Yes Historical Provider, MD  insulin aspart (NOVOLOG) 100 UNIT/ML injection Use max 65 units per day with pump Patient taking differently: For every 17g of carbs = 1 unit of insulinUse max 65 units per day with pump 09/02/13  Yes Elayne Snare, MD  Omega-3 Fatty Acids (FISH OIL PO) Take 1 capsule by mouth daily.   Yes Historical Provider, MD  pravastatin (PRAVACHOL) 40 MG tablet TAKE 1/2 TABLETS (20 MG TOTAL) BY MOUTH DAILY. 07/26/14  Yes Elayne Snare, MD  pyridoxine (B-6) 100 MG tablet Take 100 mg by mouth daily.   Yes Historical Provider, MD  vitamin B-12 (CYANOCOBALAMIN) 100 MCG tablet Take 100 mcg by mouth daily.  Yes Historical Provider, MD  doxycycline (VIBRA-TABS) 100 MG tablet Take 1 tablet (100 mg total) by mouth every 12 (twelve) hours. Patient not taking: Reported on 09/01/2014 06/18/14   Delfina Redwood, MD  loperamide (IMODIUM) 2 MG capsule As directed for diarrhea. Over the counter. Patient not taking: Reported on 09/01/2014 06/18/14   Delfina Redwood, MD  oxyCODONE-acetaminophen (ROXICET) 5-325 MG per tablet Take 1 tablet by mouth every 4 (four) hours as needed for severe pain. Patient not taking: Reported on 09/01/2014 06/18/14   Newt Minion, MD  silver sulfADIAZINE (SILVADENE) 1 % cream Apply 1 application topically daily. Apply Silvadene plus dry dressing to right and left foot  wounds change daily Patient not taking: Reported on 09/01/2014 06/18/14   Newt Minion, MD  sodium bicarbonate 650 MG tablet Take 1 tablet (650 mg total) by mouth 3 (three) times daily. Patient not taking: Reported on 09/01/2014 06/15/13   Bonnielee Haff, MD   BP 154/101 mmHg  Pulse 130  Temp(Src) 99.2 F (37.3 C) (Oral)  Resp 18  SpO2 100%  LMP 07/13/2014 (Exact Date) Physical Exam  Constitutional: She appears well-developed and well-nourished. No distress.  HENT:  Head: Normocephalic and atraumatic.  Eyes: Conjunctivae are normal. Right eye exhibits no discharge. Left eye exhibits no discharge.  Neck: Neck supple.  Cardiovascular: Normal rate, regular rhythm and normal heart sounds.  Exam reveals no gallop and no friction rub.   No murmur heard. Pulmonary/Chest: Effort normal and breath sounds normal. No respiratory distress.  Abdominal: Soft. She exhibits no distension. There is tenderness.  Mild diffuse tenderness w/o rebound or guarding  Musculoskeletal: She exhibits no edema or tenderness.  Neurological: She is alert.  Skin:  R foot with wound vac. L foot with ulceration to plantar aspect mid foot. Redness, increased warmth and tenderness consistent with cellulitis LLE from foot to below knee.   Psychiatric: She has a normal mood and affect. Her behavior is normal. Thought content normal.  Nursing note and vitals reviewed.   ED Course  Procedures (including critical care time) Labs Review Labs Reviewed  CBC WITH DIFFERENTIAL/PLATELET - Abnormal; Notable for the following:    WBC 19.9 (*)    RDW 16.0 (*)    Platelets 539 (*)    Neutrophils Relative % 95 (*)    Neutro Abs 18.8 (*)    Lymphocytes Relative 2 (*)    Lymphs Abs 0.4 (*)    All other components within normal limits  COMPREHENSIVE METABOLIC PANEL - Abnormal; Notable for the following:    Sodium 134 (*)    CO2 12 (*)    Glucose, Bld 358 (*)    BUN 48 (*)    Creatinine, Ser 1.88 (*)    Calcium 8.0 (*)     Albumin 2.7 (*)    ALT 55 (*)    Alkaline Phosphatase 161 (*)    GFR calc non Af Amer 32 (*)    GFR calc Af Amer 37 (*)    All other components within normal limits  LIPASE, BLOOD - Abnormal; Notable for the following:    Lipase 11 (*)    All other components within normal limits  URINALYSIS, ROUTINE W REFLEX MICROSCOPIC (NOT AT [facility])  I-STAT CG4 LACTIC ACID, ED    Imaging Review No results found.   EKG Interpretation None      MDM   Final diagnoses:  Cellulitis of left leg  UTI (lower urinary tract infection)  Non-intractable vomiting with nausea,  vomiting of unspecified type  Sepsis, due to unspecified organism    43yF with n/v. Clinically cellulitis of LLE. Vancomycin. Additionally noted UTI. Rocephin added. Metabolic acidosis. Related to CKD? Lactic acid normal. Hyperglycemic, but not anion gap and no ketones noted on UA. Hypertensive on arrival, but since developing hypotension. HR has been improving though. Subjectively feeling better. Needs admission for further management.     Virgel Manifold, MD 09/09/14 1534

## 2014-09-01 NOTE — Progress Notes (Addendum)
Pt currently under the care of [facility] wound care center for ulcers to left and right plantar aspect of foot. Pt comes in complaining of pain and redness to left leg starting today. No report of fevers at home but endorses chills, nausea and vomiting. Pt had four to five days of diarrhea ending yesterday. Normal stool today. Pt currently a IDDM on insulin pump, usually controlled per patient except for today while in the ED. Pt has wound vac to right foot ulcer and was seen in wound clinic today. Pt requested to manage her blood sugars while here by self administering via her insulin pump.

## 2014-09-01 NOTE — ED Notes (Addendum)
Patient refused insulin ordered by provider. She is currently using an insulin pump Novolog 2.6 administered via pump

## 2014-09-01 NOTE — Progress Notes (Signed)
Patient vomited once and stated at home she would turn her insulin pump off and monitor her CBGs. Patient turned insulin pump off, NP made aware, will continue to monitor patient and her CBGs.

## 2014-09-01 NOTE — Telephone Encounter (Signed)
She needs to have a bedtime snack protein daily to avoid overnight hypoglycemia and also eat balanced meals

## 2014-09-02 ENCOUNTER — Inpatient Hospital Stay (HOSPITAL_COMMUNITY): Payer: Medicare Other

## 2014-09-02 DIAGNOSIS — M79609 Pain in unspecified limb: Secondary | ICD-10-CM

## 2014-09-02 DIAGNOSIS — L97519 Non-pressure chronic ulcer of other part of right foot with unspecified severity: Secondary | ICD-10-CM

## 2014-09-02 DIAGNOSIS — E1041 Type 1 diabetes mellitus with diabetic mononeuropathy: Secondary | ICD-10-CM

## 2014-09-02 DIAGNOSIS — R197 Diarrhea, unspecified: Secondary | ICD-10-CM

## 2014-09-02 DIAGNOSIS — E10621 Type 1 diabetes mellitus with foot ulcer: Secondary | ICD-10-CM

## 2014-09-02 LAB — COMPREHENSIVE METABOLIC PANEL
ALBUMIN: 1.9 g/dL — AB (ref 3.5–5.0)
ALK PHOS: 110 U/L (ref 38–126)
ALT: 35 U/L (ref 14–54)
AST: 17 U/L (ref 15–41)
Anion gap: 9 (ref 5–15)
BILIRUBIN TOTAL: 0.2 mg/dL — AB (ref 0.3–1.2)
BUN: 41 mg/dL — ABNORMAL HIGH (ref 6–20)
CHLORIDE: 114 mmol/L — AB (ref 101–111)
CO2: 13 mmol/L — ABNORMAL LOW (ref 22–32)
Calcium: 7.1 mg/dL — ABNORMAL LOW (ref 8.9–10.3)
Creatinine, Ser: 1.65 mg/dL — ABNORMAL HIGH (ref 0.44–1.00)
GFR calc Af Amer: 43 mL/min — ABNORMAL LOW (ref >60)
GFR, EST NON AFRICAN AMERICAN: 37 mL/min — AB (ref >60)
Glucose, Bld: 160 mg/dL — ABNORMAL HIGH (ref 65–99)
Potassium: 3.7 mmol/L (ref 3.5–5.1)
Sodium: 136 mmol/L (ref 135–145)
TOTAL PROTEIN: 4.9 g/dL — AB (ref 6.5–8.1)

## 2014-09-02 LAB — GLUCOSE, CAPILLARY
Glucose-Capillary: 221 mg/dL — ABNORMAL HIGH (ref 65–99)
Glucose-Capillary: 243 mg/dL — ABNORMAL HIGH (ref 65–99)
Glucose-Capillary: 149 mg/dL — ABNORMAL HIGH (ref 65–99)
Glucose-Capillary: 285 mg/dL — ABNORMAL HIGH (ref 65–99)

## 2014-09-02 LAB — CBC
HCT: 29.7 % — ABNORMAL LOW (ref 36.0–46.0)
HEMOGLOBIN: 9.9 g/dL — AB (ref 12.0–15.0)
MCH: 28.1 pg (ref 26.0–34.0)
MCHC: 33.3 g/dL (ref 30.0–36.0)
MCV: 84.4 fL (ref 78.0–100.0)
PLATELETS: 413 10*3/uL — AB (ref 150–400)
RBC: 3.52 MIL/uL — AB (ref 3.87–5.11)
RDW: 16.1 % — AB (ref 11.5–15.5)
WBC: 12.9 10*3/uL — ABNORMAL HIGH (ref 4.0–10.5)

## 2014-09-02 LAB — CLOSTRIDIUM DIFFICILE BY PCR: Toxigenic C. Difficile by PCR: POSITIVE — AB

## 2014-09-02 MED ORDER — DIPHENHYDRAMINE HCL 25 MG PO CAPS
25.0000 mg | ORAL_CAPSULE | Freq: Four times a day (QID) | ORAL | Status: DC | PRN
  Filled 2014-09-02: qty 1

## 2014-09-02 MED ORDER — DIPHENHYDRAMINE HCL 50 MG PO CAPS
50.0000 mg | ORAL_CAPSULE | Freq: Once | ORAL | Status: AC
  Administered 2014-09-02: 50 mg via ORAL

## 2014-09-02 MED ORDER — OXYCODONE-ACETAMINOPHEN 5-325 MG PO TABS: 1.0000 | ORAL_TABLET | ORAL | Status: DC | PRN

## 2014-09-02 MED ORDER — METRONIDAZOLE 500 MG PO TABS
500.0000 mg | ORAL_TABLET | Freq: Three times a day (TID) | ORAL | Status: DC
  Administered 2014-09-02 – 2014-09-03 (×4): 500 mg via ORAL
  Filled 2014-09-02 (×6): qty 1

## 2014-09-02 MED ORDER — BOOST / RESOURCE BREEZE PO LIQD
1.0000 | Freq: Two times a day (BID) | ORAL | Status: DC
  Administered 2014-09-02 – 2014-09-06 (×5): 1 via ORAL

## 2014-09-02 MED ORDER — SALINE SPRAY 0.65 % NA SOLN
1.0000 | NASAL | Status: DC | PRN
  Administered 2014-09-02: 1 via NASAL
  Filled 2014-09-02: qty 44

## 2014-09-02 NOTE — Progress Notes (Signed)
Pt in bed watching movie. No distress noted. Pt voice no complaints

## 2014-09-02 NOTE — Progress Notes (Signed)
Initial Nutrition Assessment  DOCUMENTATION CODES:  Non-severe (moderate) malnutrition in context of acute illness/injury  INTERVENTION: - Will order Resource Breeze BID - RD to continue to monitor for needs  NUTRITION DIAGNOSIS:  Inadequate oral intake related to nausea, vomiting as evidenced by per patient/family report.  GOAL:  Patient will meet greater than or equal to 90% of their needs  MONITOR:  PO intake, Supplement acceptance, Weight trends, Labs, I & O's  REASON FOR ASSESSMENT:  Malnutrition Screening Tool  ASSESSMENT: Per H&P, 44 year old female with type 1 diabetes mellitus on insulin pump, diabetic neuropathy, bilateral foot ulcers, left foot ulcer, currently under treatment with a wound VAC Presented to ED from the wound care center. Patient reported that she was having diarrhea for last 4-5 days, She was on doxycycline for the left foot wound, finished 3 weeks ago. She denied any abdominal pain however had nausea and vomiting, multiple episodes this morning. Patient reports that the diarrhea has almost resolved since last night.  Pt seen for MST with BMI WDL. Pt reports that she ate dinner last night which consisted of clear liquids and then starting vomiting. She states emesis x15+ PTA and ongoing diarrhea for 4-5 days. She ate 100% of breakfast this AM and states she has vomited since waking up this morning. Pt very nauseous during visit and declines physical assessment at this time. She indicates weight 1 week ago was 134 lbs and that she currently weighs 124 lbs (7% body weight loss in 1 week).   Not meeting needs. Per notes in chart, pt will go days without eating and without bolusing insulin for meals. Medications reviewed. Labs reviewed; CBGs: 129-334 mg/dL, Cl: 114 mmol/L, BUN/creatinine now trending down, Ca low, GFR: 37.  Height:  Ht Readings from Last 1 Encounters:  09/01/14 5\' 7"  (1.702 m)    Weight:  Wt Readings from Last 1 Encounters:  09/01/14  132 lb (59.875 kg)    Ideal Body Weight:  61.4 kg (kg)  Wt Readings from Last 10 Encounters:  09/01/14 132 lb (59.875 kg)  08/16/14 132 lb (59.875 kg)  07/16/14 132 lb 9.6 oz (60.147 kg)  06/17/14 202 lb 4.8 oz (91.763 kg)  05/27/14 133 lb (60.328 kg)  04/16/14 130 lb (58.968 kg)  04/06/14 135 lb (61.236 kg)  03/19/14 135 lb (61.236 kg)  01/06/14 128 lb (58.06 kg)  12/04/13 127 lb (57.607 kg)    BMI:  Body mass index is 20.67 kg/(m^2).  Estimated Nutritional Needs:  Kcal:  1500-1700  Protein:  60-75 grams  Fluid:  3 L/day  Skin:  WDL  Diet Order:  Diet Carb Modified Fluid consistency:: Thin; Room service appropriate?: Yes  EDUCATION NEEDS:  No education needs identified at this time   Intake/Output Summary (Last 24 hours) at 09/02/14 1007 Last data filed at 09/02/14 0700  Gross per 24 hour  Intake 4962.08 ml  Output    200 ml  Net 4762.08 ml    Last BM:  PTA   Jarome Matin, RD, LDN Inpatient Clinical Dietitian Pager # [phone] After hours/weekend pager # [phone]

## 2014-09-02 NOTE — Progress Notes (Signed)
Left lower extremity venous duplex completed.  Left:  No evidence of DVT, superficial thrombosis, or Baker's cyst.  Right:  Negative for DVT in the common femoral vein.  

## 2014-09-02 NOTE — Progress Notes (Signed)
Inpatient Diabetes Program Recommendations  AACE/ADA: New Consensus Statement on Inpatient Glycemic Control (2013)  Target Ranges:  Prepandial:   less than 140 mg/dL      Peak postprandial:   less than 180 mg/dL (1-2 hours)      Critically ill patients:  140 - 180 mg/dL   Reason for Visit: Diabetes Consult - Insulin Pump  Diabetes history: DM1 since age [age] years old Outpatient Diabetes medications: Insulin Pump Current orders for Inpatient glycemic control: Insulin per insulin pump policy  Results for YARITZI, CRAUN (MRN [id number]) as of 09/02/2014 09:59  Ref. Range 09/02/2014 05:26  Sodium Latest Ref Range: 135-145 mmol/L 136  Potassium Latest Ref Range: 3.5-5.1 mmol/L 3.7  Chloride Latest Ref Range: 101-111 mmol/L 114 (H)  CO2 Latest Ref Range: 22-32 mmol/L 13 (L)  BUN Latest Ref Range: 6-20 mg/dL 41 (H)  Creatinine Latest Ref Range: 0.44-1.00 mg/dL 1.65 (H)  Calcium Latest Ref Range: 8.9-10.3 mg/dL 7.1 (L)  EGFR (Non-African Amer.) Latest Ref Range: >60 mL/min 37 (L)  EGFR (African American) Latest Ref Range: >60 mL/min 43 (L)  Glucose Latest Ref Range: 65-99 mg/dL 160 (H)  Anion gap Latest Ref Range: 5-15  9   Pt states her basal settings were changed in the last several weeks d/t am hypoglycemia. Sees MD and CDE approx every month or two. Very knowledgeable about her diabetes. Has Hx of turning off pump and not eating.  Discussed importance of keeping pump on, bolusing for correction and meal coverage. No supplies available and does not have anyone to bring them.  Gave RN CHO sheet for pt to write down amount of boluses. Contract given to sign. If blood sugars >350, recommend BMET to rule out DKA. Per insulin pump policy, if blood sugars stay elevated, will need to d/c pump and control with Lantus and Novolog. Will follow daily.  Results for BERNADETTA, ROELL (MRN [id number]) as of 09/02/2014 09:59  Ref. Range 09/01/2014 08:18 09/01/2014 09:28 09/01/2014 15:28 09/01/2014 16:57  09/01/2014 22:07  Glucose-Capillary Latest Ref Range: 65-99 mg/dL 106 (H) 228 (H) 334 (H) 291 (H) 129 (H)     Results for SILVERIA, BOTZ (MRN [id number]) as of 09/02/2014 09:59  Ref. Range 07/05/2014 14:16  Hemoglobin A1C Latest Ref Range: 4.6-6.5 % 9.8 (H)   Pump settings -  Basal: 2400 - 0.0225 units/hr 0600 - 0.3 units/hr 1200 - 0.6 units/hr 1700 - 0.5 units/hr 2200 - 0.6 units/hr  Bolus: Sensitivity factor : 90 Insulin to CHO ratio: 1?15 Target glucose: 150 mg/dL  Thank you. Lorenda Peck, RD, LDN, CDE Inpatient Diabetes Coordinator [phone]

## 2014-09-02 NOTE — Progress Notes (Signed)
Patient's C. diff result was positive. NP on call notified.

## 2014-09-02 NOTE — Progress Notes (Signed)
[facility]                                                                              Patient Demographics  [name], is a 44 y.o. female, DOB - Sep 02, 1970, [id number]  Admit date - 09/01/2014   Admitting Physician Ripudeep Krystal Eaton, MD  Outpatient Primary MD for the patient is Hayden Rasmussen., MD  LOS - 1   Chief Complaint  Patient presents with  . Emesis  . Diarrhea       Brief HPI  Patient is a 44 year old female with type 1 diabetes mellitus on insulin pump, diabetic neuropathy, bilateral foot ulcers, left foot ulcer, currently under treatment with a wound VAC Presented to ED from the wound care center. Patient reported that she was having diarrhea for last 4-5 days, She was on doxycycline for the left foot wound, finished 3 weeks ago. She denied any abdominal pain however had nausea and vomiting, multiple episodes this morning. Patient reports that the diarrhea has almost resolved since last night. She went to the wound care center on the day of admission and was also noticed to have left lower extremity erythema and pain which was new. She has a left foot ulcer as well which is being treated.  Patient also reported that for the past 1 week urine has been cloudy, increased frequency and urgency and has been drinking cranberry juice. She reported subjective fevers and chills. In ED, patient was noticed to have tachycardia with heart rate of 130, BP 199/52, WBCs 19.9, creatinine 1.8, glucose 358, UA positive for UTI     Assessment & Plan    Principal Problem:  Sepsis with metabolic acidosis, leukocytosis: Due to left lower extremity cellulitis, UTI and C. difficile colitis - Elevated pro-calcitonin, follow blood cultures, urine cultures - Stool for C. difficile positive - Placed on IV vancomycin, for left lower extremity cellulitis, IV Rocephin for UTI, now placed on Flagyl for C. difficile colitis  Active Problems: Left lower extremity  cellulitis: Improving  - Placed on IV vancomycin - Obtain lower extremity venous Dopplers to rule out DVT  nausea, vomiting, diarrhea; due to C. 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 currently with a wound VAC - Appreciate wound care following, x-ray of the right foot and left foot ordered, to rule out osteomyelitis    UTI (urinary tract infection) -Follow urine culture and sensitivities, continue IV Rocephin  Mild AK I on CKD (chronic kidney disease), stage III: Likely due to #1  -Baseline creatinine 1.7, improving today   Code Status: Full code  Family Communication: Discussed in detail with the patient, all imaging results, lab results explained to the patient   Disposition  Plan: Likely DC in 24-48 hours if remains stable  Time Spent in minutes  25 minutes  Procedures  None  Consults   Wound care  DVT Prophylaxis  heparin subcutaneous   Medications  Scheduled Meds: . calcitRIOL  0.25 mcg Oral Once per day on Mon Wed Fri  . cefTRIAXone (ROCEPHIN)  IV  1 g Intravenous Q24H  . feeding supplement (RESOURCE BREEZE)  1 Container Oral BID BM  . ferrous sulfate  325 mg Oral BID WC  . folic acid  1 mg Oral Daily  . heparin  5,000 Units Subcutaneous 3 times per day  . insulin aspart  10 Units Intravenous Once  . insulin pump   Subcutaneous TID AC, HS, 0200  . metroNIDAZOLE  500 mg Oral 3 times per day  . ondansetron (ZOFRAN) IV  4 mg Intravenous Once  . pravastatin  20 mg Oral q1800  . pyridoxine   100 mg Oral Daily  . sodium chloride  3 mL Intravenous Q12H  . vancomycin  1,000 mg Intravenous Q24H  . vitamin B-12  100 mcg Oral Daily   Continuous Infusions: . sodium chloride 125 mL/hr at 09/02/14 1307   PRN Meds:.acetaminophen **OR** acetaminophen, albuterol, alum & mag hydroxide-simeth, HYDROcodone-acetaminophen, HYDROmorphone (DILAUDID) injection, ondansetron **OR** ondansetron (ZOFRAN) IV, sodium chloride   Antibiotics   Anti-infectives    Start     Dose/Rate Route Frequency Ordered Stop   09/02/14 1600  cefTRIAXone (ROCEPHIN) 1 g in dextrose 5 % 50 mL IVPB - Premix     1 g 100 mL/hr over 30 Minutes Intravenous Every 24 hours 09/01/14 1708     09/02/14 1400  vancomycin (VANCOCIN) IVPB 1000 mg/200 mL premix     1,000 mg 200 mL/hr over 60 Minutes Intravenous Every 24 hours 09/01/14 1254     09/02/14 0500  metroNIDAZOLE (FLAGYL) tablet 500 mg     500 mg Oral 3 times per day 09/02/14 0445     09/01/14 1430  cefTRIAXone (ROCEPHIN) 1 g in dextrose 5 % 50 mL IVPB     1 g 100 mL/hr over 30 Minutes Intravenous  Once 09/01/14 1415 09/01/14 1628   09/01/14 1215  vancomycin (VANCOCIN) 1,250 mg in sodium chloride 0.9 % 250 mL IVPB     1,250 mg 166.7 mL/hr over 90 Minutes Intravenous STAT 09/01/14 1213 09/01/14 1404        Subjective:   [name] was seen and examined today.  Feeling better, complaining of pain in the right lower extremity and left lower extremity. Wound vac on.  Patient denies dizziness, chest pain, shortness of breath, abdominal pain, N/V/D/C, new weakness, numbess, tingling. No acute events overnight.    Objective:   Blood pressure 93/43, pulse 77, temperature 98 F (36.7 C), temperature source Oral, resp. rate 16, height 5\' 7"  (1.702 m), weight 59.875 kg (132 lb), last menstrual period 07/13/2014, SpO2 99 %.  Wt Readings from Last 3 Encounters:  09/01/14 59.875 kg (132 lb)  08/16/14 59.875 kg (132 lb)  07/16/14 60.147 kg (132 lb 9.6 oz)      Intake/Output Summary (Last 24 hours) at 09/02/14 1327 Last data filed at 09/02/14 1112  Gross per 24 hour  Intake 3715.08 ml  Output      0 ml  Net 3715.08 ml    Exam  General: Alert and oriented x 3, NAD  HEENT:  PERRLA, EOMI, Anicteic Sclera, mucous membranes moist.   Neck: Supple, no JVD, no masses  CVS: S1 S2 auscultated,  no rubs, murmurs or gallops. Regular rate and rhythm.  Respiratory: Clear to auscultation bilaterally, no wheezing, rales or rhonchi  Abdomen: Soft, nontender, nondistended, + bowel sounds  Ext: no cyanosis clubbing or edema  Neuro: AAOx3, Cr N's II- XII. Strength 5/5 upper and lower extremities bilaterally  Skin: right foot wound with wound VAC, left foot with ulceration and the plantar aspect, increased erythema, tenderness  LLE  Psych: Normal affect and demeanor, alert and oriented x3    Data Review   Micro Results Recent Results (from the past 240 hour(s))  Clostridium Difficile by PCR     Status: Abnormal   Collection Time: 09/02/14  2:07 AM  Result Value Ref Range Status   C difficile by pcr POSITIVE (A) NEGATIVE Final    Comment: CRITICAL RESULT CALLED TO, READ BACK BY AND VERIFIED WITHMirian Mo RN @ [phone] ON 09/02/14 BY C DAVIS     Radiology Reports Dg Chest Port 1 View  09/01/2014   CLINICAL DATA:  Vomiting and diarrhea, several days duration. Hypertension and diabetes.  EXAM: PORTABLE CHEST - 1 VIEW  COMPARISON:  06/07/2014  FINDINGS: Heart size is normal. Mediastinal shadows are normal. The lungs are clear. No effusions. No free air seen under the diaphragm. No bony abnormality.  IMPRESSION: Normal one-view chest   Electronically Signed   By: Nelson Chimes M.D.   On: 09/01/2014 16:35   Dg Foot Complete Left  09/02/2014   CLINICAL DATA:  Diabetic foot ulcer  EXAM: LEFT FOOT - COMPLETE 3+ VIEW  COMPARISON:  05/26/2012  FINDINGS: Three views of the left foot submitted. There is diffuse osteopenia. No acute fracture or subluxation.  No bone destruction to suggest osteomyelitis.  IMPRESSION: No acute fracture or subluxation. Diffuse osteopenia. No definite evidence of osteomyelitis   Electronically Signed   By: Lahoma Crocker M.D.   On: 09/02/2014 13:16   Dg Foot Complete Right  09/02/2014   CLINICAL DATA:  Diabetic foot ulcer  EXAM: RIGHT FOOT COMPLETE - 3+ VIEW  COMPARISON:  06/02/2014  FINDINGS: Three views of the right foot submitted. There is diffuse osteopenia. There is osteolysis and cortical loss distal aspect of fourth metatarsal and base of proximal phalanx fourth toe. Findings are highly suspicious for osteomyelitis. Clinical correlation is necessary. Further correlation with MRI is recommended.  IMPRESSION: There is diffuse osteopenia. There is osteolysis and cortical loss distal aspect of fourth metatarsal and base of proximal phalanx fourth toe. Findings are highly suspicious for osteomyelitis. Clinical correlation is necessary. Further correlation with MRI is recommended.   Electronically Signed   By: Lahoma Crocker M.D.   On: 09/02/2014 13:15    CBC  Recent Labs Lab 09/01/14 1023 09/02/14 0526  WBC 19.9* 12.9*  HGB 12.0 9.9*  HCT 36.6 29.7*  PLT 539* 413*  MCV 85.1 84.4  MCH 27.9 28.1  MCHC 32.8 33.3  RDW 16.0* 16.1*  LYMPHSABS 0.4*  --   MONOABS 0.6  --   EOSABS 0.0  --   BASOSABS 0.0  --     Chemistries   Recent Labs Lab 09/01/14 1023 09/02/14 0526  NA 134* 136  K 4.0 3.7  CL 109 114*  CO2 12* 13*  GLUCOSE 358* 160*  BUN 48* 41*  CREATININE 1.88* 1.65*  CALCIUM 8.0* 7.1*  AST 31 17  ALT 55* 35  ALKPHOS 161* 110  BILITOT 0.4 0.2*   ------------------------------------------------------------------------------------------------------------------ estimated creatinine clearance is 42 mL/min (by C-G formula based on Cr of 1.65). ------------------------------------------------------------------------------------------------------------------  No results for input(s): HGBA1C in the last 72  hours. ------------------------------------------------------------------------------------------------------------------ No results for input(s): CHOL, HDL, LDLCALC, TRIG, CHOLHDL, LDLDIRECT in the last 72 hours. ------------------------------------------------------------------------------------------------------------------ No results for input(s): TSH, T4TOTAL, T3FREE, THYROIDAB in the last 72 hours.  Invalid input(s): FREET3 ------------------------------------------------------------------------------------------------------------------ No results for input(s): VITAMINB12, FOLATE, FERRITIN, TIBC, IRON, RETICCTPCT in the last 72 hours.  Coagulation profile  Recent Labs Lab 09/01/14 1850  INR 1.65*    No results for input(s): DDIMER in the last 72 hours.  Cardiac Enzymes No results for input(s): CKMB, TROPONINI, MYOGLOBIN in the last 168 hours.  Invalid input(s): CK ------------------------------------------------------------------------------------------------------------------ Invalid input(s): [place]  08/31/14 0910 09/01/14 0818 09/01/14 0928 09/01/14 1528 09/01/14 1657 09/01/14 2207  GLUCAP 352* 106* 228* 334* 291* 51*     RAI,RIPUDEEP M.D. [facility] 09/02/2014, 1:27 PM  Pager: [phone]   Between 7am to 7pm - call Pager - [phone]  After 7pm go to [email or web address] - password [email or web address]  Call night coverage person covering after 7pm

## 2014-09-02 NOTE — Care Management Note (Signed)
Case Management Note  Patient Details  Name: [name] MRN: [id number] Date of Birth: 03-Aug-1970  Subjective/Objective: 44 y/o f admitted w/sepsis.From home.Has pcp,pharmacy-insulin pump supplies.                   Action/Plan:Active w/[facility]-wound care/wound vac.   Expected Discharge Date:   (unknown)               Expected Discharge Plan:  [place] (Active w/[facility]-wound care/wound vac.)  In-House Referral:     Discharge planning Services  CM Consult  Post Acute Care Choice:  [place] ([facility]-wound vac/wound care) Choice offered to:  Patient  DME Arranged:    DME Agency:  [place] Arranged:  RN (resumption of [place]) [place] Agency:     Status of Service:  In process, will continue to follow  Medicare Important Message Given:    Date Medicare IM Given:    Medicare IM give by:    Date Additional Medicare IM Given:    Additional Medicare Important Message give by:     If discussed at [place] of Stay Meetings, dates discussed:    Additional Comments:  Dessa Phi, RN 09/02/2014, 4:11 PM

## 2014-09-02 NOTE — Progress Notes (Signed)
Dr. Tana Coast aware via phone pt's  Insulin Pump currently turned off. Serum glucose this am 160. Awaiting CBG results. See new orders received. Encouraged pt to turn insulin pump back on. Pt refused stating "My sugar was only 82 at 2am. I'll turn it on if I need to cover." Instructed of policy and contract. Pt reported "this is how I do it at home." Reassurance given.

## 2014-09-02 NOTE — Progress Notes (Signed)
Assumed care of pt at this time. Pt is stable with no complaints. Will continue to monitor pt.  [name] [facility] 09/02/2014

## 2014-09-02 NOTE — Consult Note (Signed)
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  Right: 6cm x 3cm x 1cm with undermining from 10-12 o'clock measuring 2cm with red, moist wound bed Wound bed: See above. Drainage (amount, consistency, odor) serous in cannister of home NPWT device for right ulcer and old blood on dressing of left ulcer Periwound:intact Dressing procedure/placement/frequency: NPWT Device changed and converted to our hospital's contracted vendor ([employer]).  Orders provided for left foot wound for nursing staff. [place] nursing team will follow for [facility] dressing changes due to need for bridging, and will remain available to this patient, the nursing and medical team.   Thanks, Maudie Flakes, MSN, RN, [place], [place], [place] [phone])

## 2014-09-03 LAB — BASIC METABOLIC PANEL
Anion gap: 9 (ref 5–15)
BUN: 34 mg/dL — ABNORMAL HIGH (ref 6–20)
CALCIUM: 7.7 mg/dL — AB (ref 8.9–10.3)
CHLORIDE: 112 mmol/L — AB (ref 101–111)
CO2: 16 mmol/L — ABNORMAL LOW (ref 22–32)
CREATININE: 1.56 mg/dL — AB (ref 0.44–1.00)
GFR calc Af Amer: 46 mL/min — ABNORMAL LOW (ref >60)
GFR calc non Af Amer: 40 mL/min — ABNORMAL LOW (ref >60)
Glucose, Bld: 266 mg/dL — ABNORMAL HIGH (ref 65–99)
Potassium: 3.4 mmol/L — ABNORMAL LOW (ref 3.5–5.1)
Sodium: 137 mmol/L (ref 135–145)

## 2014-09-03 LAB — CBC
HCT: 28.7 % — ABNORMAL LOW (ref 36.0–46.0)
HEMOGLOBIN: 9.6 g/dL — AB (ref 12.0–15.0)
MCH: 28.5 pg (ref 26.0–34.0)
MCHC: 33.4 g/dL (ref 30.0–36.0)
MCV: 85.2 fL (ref 78.0–100.0)
PLATELETS: 448 10*3/uL — AB (ref 150–400)
RBC: 3.37 MIL/uL — ABNORMAL LOW (ref 3.87–5.11)
RDW: 16.6 % — ABNORMAL HIGH (ref 11.5–15.5)
WBC: 9.5 10*3/uL (ref 4.0–10.5)

## 2014-09-03 LAB — URINE CULTURE: Colony Count: >=100000

## 2014-09-03 LAB — FECAL LACTOFERRIN, QUANT: Fecal Lactoferrin: POSITIVE

## 2014-09-03 LAB — GLUCOSE, CAPILLARY
GLUCOSE-CAPILLARY: 118 mg/dL — AB (ref 65–99)
GLUCOSE-CAPILLARY: 146 mg/dL — AB (ref 65–99)
GLUCOSE-CAPILLARY: 73 mg/dL (ref 65–99)
Glucose-Capillary: 300 mg/dL — ABNORMAL HIGH (ref 65–99)
Glucose-Capillary: 105 mg/dL — ABNORMAL HIGH (ref 65–99)

## 2014-09-03 LAB — HEMOGLOBIN A1C
HEMOGLOBIN A1C: 9.7 % — AB (ref 4.8–5.6)
Mean Plasma Glucose: 232 mg/dL

## 2014-09-03 MED ORDER — VANCOMYCIN 50 MG/ML ORAL SOLUTION
125.0000 mg | Freq: Four times a day (QID) | ORAL | Status: DC
  Administered 2014-09-03 (×3): 125 mg via ORAL
  Filled 2014-09-03 (×6): qty 2.5

## 2014-09-03 MED ORDER — POTASSIUM CHLORIDE CRYS ER 20 MEQ PO TBCR
40.0000 meq | EXTENDED_RELEASE_TABLET | Freq: Once | ORAL | Status: AC
  Administered 2014-09-03: 40 meq via ORAL
  Filled 2014-09-03: qty 2

## 2014-09-03 MED ORDER — SACCHAROMYCES BOULARDII 250 MG PO CAPS
500.0000 mg | ORAL_CAPSULE | Freq: Two times a day (BID) | ORAL | Status: DC
  Administered 2014-09-03 – 2014-09-06 (×7): 500 mg via ORAL
  Filled 2014-09-03 (×8): qty 2

## 2014-09-03 NOTE — Progress Notes (Signed)
[facility]                                                                              Patient Demographics  [name], is a 44 y.o. female, DOB - [birth year], GX:[id number]  Admit date - 09/01/2014   Admitting Physician Nancy Krystal Eaton, MD  Outpatient Primary MD for the patient is [name]., MD  LOS - 2   Chief Complaint  Patient presents with  . Emesis  . Diarrhea       Brief HPI  Patient is a 44 year old female with type 1 diabetes mellitus on insulin pump, diabetic neuropathy, bilateral foot ulcers, left foot ulcer, currently under treatment with a wound VAC Presented to ED from the wound care center. Patient reported that she was having diarrhea for last 4-5 days, She was on doxycycline for the left foot wound, finished 3 weeks ago. She denied any abdominal pain however had nausea and vomiting, multiple episodes this morning. Patient reports that the diarrhea has almost resolved since last night. She went to the wound care center on the day of admission and was also noticed to have left lower extremity erythema and pain which was new. She has a left foot ulcer as well which is being treated.  Patient also reported that for the past 1 week urine has been cloudy, increased frequency and urgency and has been drinking cranberry juice. She reported subjective fevers and chills. In ED, patient was noticed to have tachycardia with heart rate of 130, BP 199/52, WBCs 19.9, creatinine 1.8, glucose 358, UA positive for UTI     Assessment & Plan    Principal Problem:  Sepsis with metabolic acidosis, leukocytosis: Due to left lower extremity cellulitis, UTI and C. difficile colitis - Elevated pro-calcitonin, follow blood cultures, urine cultures - Stool for C. difficile positive, no improvement with oral Flagyl, placed on oral vancomycin today - Placed on IV vancomycin, for left lower extremity cellulitis, IV Rocephin for UTI,   Active Problems: Left  lower extremity cellulitis: Improving  - On IV vancomycin - Doppler ultrasound negative for DVT  - Left foot x-ray shows diffuse osteopenia but no definite evidence of possible pneumonitis  Right foot osteomyelitis, diabetic foot ulcer Right foot x-ray shows ostial lyses and cortical loss distal aspect of fourth metatarsal and base of proximal phalanx fourth toe, suspicious for osteomyelitis. Patient had MRI in 3/16 which had not shown any osteomyelitis at that time Orthopedics consult called, and discussed with Dr. Lorin Mercy   nausea, vomiting, diarrhea; due to C. difficile colitis Patient refuses oral Flagyl, states that her diarrhea has been worsening, discontinued oral Flagyl, placed on oral vancomycin Add florastor    Type 1 diabetes mellitus with neurological manifestations, uncontrolled - Patient wants to continue with insulin pump, placed orders Hemoglobin A1c 9.7   Escherichia coli UTI (urinary tract infection) - continue IV Rocephin  Mild AK I on CKD (chronic kidney disease), stage III: Likely due to #1  -Baseline creatinine 1.7, improving today   Code Status: Full code  Family Communication: Discussed in detail with the patient, all imaging results, lab results explained to the patient  Disposition Plan:   Time Spent in minutes  25 minutes  Procedures  None  Consults   Wound care Orthopedics  DVT Prophylaxis  heparin subcutaneous   Medications  Scheduled Meds: . calcitRIOL  0.25 mcg Oral Once per day on Mon Wed Fri  . cefTRIAXone (ROCEPHIN)  IV  1 g Intravenous Q24H  . feeding supplement (RESOURCE BREEZE)  1 Container Oral BID BM  . ferrous sulfate  325 mg Oral BID WC  . folic acid  1 mg Oral Daily  . heparin  5,000 Units Subcutaneous 3 times per day  . insulin aspart  10 Units Intravenous Once  . insulin pump   Subcutaneous TID AC, HS, 0200  . ondansetron (ZOFRAN) IV  4 mg Intravenous Once  . pravastatin  20 mg Oral q1800  . pyridoxine  100 mg  Oral Daily  . saccharomyces boulardii  500 mg Oral BID  . sodium chloride  3 mL Intravenous Q12H  . vancomycin  125 mg Oral QID  . vancomycin  1,000 mg Intravenous Q24H  . vitamin B-12  100 mcg Oral Daily   Continuous Infusions: . sodium chloride 100 mL/hr at 09/03/14 1136   PRN Meds:.acetaminophen **OR** acetaminophen, albuterol, alum & mag hydroxide-simeth, diphenhydrAMINE, HYDROcodone-acetaminophen, HYDROmorphone (DILAUDID) injection, ondansetron **OR** ondansetron (ZOFRAN) IV, sodium chloride   Antibiotics   Anti-infectives    Start     Dose/Rate Route Frequency Ordered Stop   09/03/14 1400  vancomycin (VANCOCIN) 50 mg/mL oral solution 125 mg     125 mg Oral 4 times daily 09/03/14 1150 09/17/14 1359   09/02/14 1600  cefTRIAXone (ROCEPHIN) 1 g in dextrose 5 % 50 mL IVPB - Premix     1 g 100 mL/hr over 30 Minutes Intravenous Every 24 hours 09/01/14 1708     09/02/14 1400  vancomycin (VANCOCIN) IVPB 1000 mg/200 mL premix     1,000 mg 200 mL/hr over 60 Minutes Intravenous Every 24 hours 09/01/14 1254     09/02/14 0500  metroNIDAZOLE (FLAGYL) tablet 500 mg  Status:  Discontinued     500 mg Oral 3 times per day 09/02/14 0445 09/03/14 1149   09/01/14 1430  cefTRIAXone (ROCEPHIN) 1 g in dextrose 5 % 50 mL IVPB     1 g 100 mL/hr over 30 Minutes Intravenous  Once 09/01/14 1415 09/01/14 1628   09/01/14 1215  vancomycin (VANCOCIN) 1,250 mg in sodium chloride 0.9 % 250 mL IVPB     1,250 mg 166.7 mL/hr over 90 Minutes Intravenous STAT 09/01/14 1213 09/01/14 1404        Subjective:   [name] was seen and examined today.  Complaining of diarrhea not improving, 2 bowel movements watery this morning. Patient denies dizziness, chest pain, shortness of breath, abdominal pain, new weakness, numbess, tingling. No acute events overnight.    Objective:   Blood pressure 110/52, pulse 86, temperature 97.7 F (36.5 C), temperature source Oral, resp. rate 18, height 5\' 7"  (1.702 m),  weight 59.875 kg (132 lb), last menstrual period 07/13/2014, SpO2 98 %.  Wt Readings from Last 3 Encounters:  09/01/14 59.875 kg (132 lb)  08/16/14 59.875 kg (132 lb)  07/16/14 60.147 kg (132 lb 9.6 oz)    No intake or output data in the 24 hours ending 09/03/14 1342  Exam  General: Alert and oriented x 3, NAD  HEENT:  PERRLA, EOMI, Anicteic Sclera, mucous membranes moist.   Neck: Supple, no JVD, no masses  CVS: S1 S2 clear  Respiratory:  CTAB  Abdomen: Soft, nontender, nondistended, + bowel sounds  Ext: no cyanosis clubbing or edema  Neuro: no new deficits  Skin: right foot wound with wound VAC, left foot with ulceration and the plantar aspect, increased erythema, tenderness  LLE  Psych: Normal affect and demeanor, alert and oriented x3    Data Review   Micro Results Recent Results (from the past 240 hour(s))  Urine culture     Status: None   Collection Time: 09/01/14  1:04 PM  Result Value Ref Range Status   Specimen Description URINE, RANDOM  Final   Special Requests NONE  Final   Colony Count   Final    >=100,000 COLONIES/ML Performed at [facility]    Culture   Final    ESCHERICHIA COLI Performed at [facility]    Report Status 09/03/2014 FINAL  Final   Organism ID, Bacteria ESCHERICHIA COLI  Final      Susceptibility   Escherichia coli - MIC*    AMPICILLIN 8 SENSITIVE Sensitive     CEFAZOLIN <=4 SENSITIVE Sensitive     CEFTRIAXONE <=1 SENSITIVE Sensitive     CIPROFLOXACIN <=0.25 SENSITIVE Sensitive     GENTAMICIN <=1 SENSITIVE Sensitive     LEVOFLOXACIN 1 SENSITIVE Sensitive     NITROFURANTOIN 64 INTERMEDIATE Intermediate     TOBRAMYCIN <=1 SENSITIVE Sensitive     TRIMETH/SULFA >=320 RESISTANT Resistant     PIP/TAZO <=4 SENSITIVE Sensitive     * ESCHERICHIA COLI  Culture, blood (x 2)     Status: None (Preliminary result)   Collection Time: 09/01/14  6:50 PM  Result Value Ref Range Status   Specimen Description BLOOD LEFT ARM   Final   Special Requests BOTTLES DRAWN AEROBIC ONLY 3CC  Final   Culture   Final           BLOOD CULTURE RECEIVED NO GROWTH TO DATE CULTURE WILL BE HELD FOR 5 DAYS BEFORE ISSUING A FINAL NEGATIVE REPORT Performed at [facility]    Report Status PENDING  Incomplete  Clostridium Difficile by PCR     Status: Abnormal   Collection Time: 09/02/14  2:07 AM  Result Value Ref Range Status   C difficile by pcr POSITIVE (A) NEGATIVE Final    Comment: CRITICAL RESULT CALLED TO, READ BACK BY AND VERIFIED WITH: B Jiles Harold RN @ [phone] ON 09/02/14 BY C DAVIS   Stool culture     Status: None (Preliminary result)   Collection Time: 09/02/14  2:07 AM  Result Value Ref Range Status   Specimen Description STOOL  Final   Special Requests NONE  Final   Culture   Final    Culture reincubated for better growth Performed at [facility]    Report Status PENDING  Incomplete  Culture, blood (x 2)     Status: None (Preliminary result)   Collection Time: 09/02/14  5:26 AM  Result Value Ref Range Status   Specimen Description BLOOD LEFT HAND  Final   Special Requests BOTTLES DRAWN AEROBIC ONLY 1CC  Final   Culture   Final           BLOOD CULTURE RECEIVED NO GROWTH TO DATE CULTURE WILL BE HELD FOR 5 DAYS BEFORE ISSUING A FINAL NEGATIVE REPORT Performed at [facility]    Report Status PENDING  Incomplete    Radiology Reports Dg Chest Port 1 View  09/01/2014   CLINICAL DATA:  Vomiting and diarrhea, several days duration. Hypertension and  diabetes.  EXAM: PORTABLE CHEST - 1 VIEW  COMPARISON:  06/07/2014  FINDINGS: Heart size is normal. Mediastinal shadows are normal. The lungs are clear. No effusions. No free air seen under the diaphragm. No bony abnormality.  IMPRESSION: Normal one-view chest   Electronically Signed   By: Nelson Chimes M.D.   On: 09/01/2014 16:35   Dg Foot Complete Left  09/02/2014   CLINICAL DATA:  Diabetic foot ulcer  EXAM: LEFT FOOT - COMPLETE 3+ VIEW  COMPARISON:   05/26/2012  FINDINGS: Three views of the left foot submitted. There is diffuse osteopenia. No acute fracture or subluxation. No bone destruction to suggest osteomyelitis.  IMPRESSION: No acute fracture or subluxation. Diffuse osteopenia. No definite evidence of osteomyelitis   Electronically Signed   By: Lahoma Crocker M.D.   On: 09/02/2014 13:16   Dg Foot Complete Right  09/02/2014   CLINICAL DATA:  Diabetic foot ulcer  EXAM: RIGHT FOOT COMPLETE - 3+ VIEW  COMPARISON:  06/02/2014  FINDINGS: Three views of the right foot submitted. There is diffuse osteopenia. There is osteolysis and cortical loss distal aspect of fourth metatarsal and base of proximal phalanx fourth toe. Findings are highly suspicious for osteomyelitis. Clinical correlation is necessary. Further correlation with MRI is recommended.  IMPRESSION: There is diffuse osteopenia. There is osteolysis and cortical loss distal aspect of fourth metatarsal and base of proximal phalanx fourth toe. Findings are highly suspicious for osteomyelitis. Clinical correlation is necessary. Further correlation with MRI is recommended.   Electronically Signed   By: Lahoma Crocker M.D.   On: 09/02/2014 13:15    CBC  Recent Labs Lab 09/01/14 1023 09/02/14 0526 09/03/14 0449  WBC 19.9* 12.9* 9.5  HGB 12.0 9.9* 9.6*  HCT 36.6 29.7* 28.7*  PLT 539* 413* 448*  MCV 85.1 84.4 85.2  MCH 27.9 28.1 28.5  MCHC 32.8 33.3 33.4  RDW 16.0* 16.1* 16.6*  LYMPHSABS 0.4*  --   --   MONOABS 0.6  --   --   EOSABS 0.0  --   --   BASOSABS 0.0  --   --     Chemistries   Recent Labs Lab 09/01/14 1023 09/02/14 0526 09/03/14 0449  NA 134* 136 137  K 4.0 3.7 3.4*  CL 109 114* 112*  CO2 12* 13* 16*  GLUCOSE 358* 160* 266*  BUN 48* 41* 34*  CREATININE 1.88* 1.65* 1.56*  CALCIUM 8.0* 7.1* 7.7*  AST 31 17  --   ALT 55* 35  --   ALKPHOS 161* 110  --   BILITOT 0.4 0.2*  --     ------------------------------------------------------------------------------------------------------------------ estimated creatinine clearance is 44.4 mL/min (by C-G formula based on Cr of 1.56). ------------------------------------------------------------------------------------------------------------------  Recent Labs  09/01/14 1850  HGBA1C 9.7*   ------------------------------------------------------------------------------------------------------------------ No results for input(s): CHOL, HDL, LDLCALC, TRIG, CHOLHDL, LDLDIRECT in the last 72 hours. ------------------------------------------------------------------------------------------------------------------ No results for input(s): TSH, T4TOTAL, T3FREE, THYROIDAB in the last 72 hours.  Invalid input(s): FREET3 ------------------------------------------------------------------------------------------------------------------ No results for input(s): VITAMINB12, FOLATE, FERRITIN, TIBC, IRON, RETICCTPCT in the last 72 hours.  Coagulation profile  Recent Labs Lab 09/01/14 1850  INR 1.65*    No results for input(s): DDIMER in the last 72 hours.  Cardiac Enzymes No results for input(s): CKMB, TROPONINI, MYOGLOBIN in the last 168 hours.  Invalid input(s): CK ------------------------------------------------------------------------------------------------------------------ Invalid input(s): POCBNP   Recent Labs  09/01/14 2207 09/02/14 0832 09/02/14 1147 09/02/14 1722 09/02/14 2118 09/03/14 0259  GLUCAP 129* 221* 243* 149* 285* 300*  RAI,Nancy M.D. [facility] 09/03/2014, 1:42 PM  Pager: [phone]   Between 7am to 7pm - call Pager - [phone]  After 7pm go to [email or web address] - password [email or web address]  Call night coverage person covering after 7pm

## 2014-09-03 NOTE — Consult Note (Signed)
Reason for Consult:Wagner grade 2 to 4 diabetic  ulcers Referring Physician: Tana Coast  MD  [name] is an 44 y.o. female.  HPI: type 1 diabetic since age 62 with insulin pump and diabetic neuropathy. Followed at wound clinic and has VAC on right foot and cellulitis and left 4th MT head ulcer. Right foot xrays show Osteomyelitis destruction of 4th MT head and portion of proximal phalanx. Has been getting ABX and hyperbaric oxygen treatments. MRI pending of right foot. Likely needs MRI of left foot as well. Positive UTI and C-diff with diarrhea.   Past Medical History  Diagnosis Date  . Diabetes mellitus   . Neuropathy in diabetes   . Hypertension   . Hypercholesteremia   . H/O seasonal allergies   . Anemia   . Left foot infection     Past Surgical History  Procedure Laterality Date  . Cesarean section    . Eye surgery    . Hemrrhoidectomy    . Peripherally inserted central catheter insertion    . I&d extremity Right 06/10/2014    Procedure: IRRIGATION AND DEBRIDEMENT Right Foot;  Surgeon: Newt Minion, MD;  Location: [facility];  Service: Orthopedics;  Laterality: Right;  . Skin split graft Right 06/15/2014    Procedure: SPLIT THICKNESS SKIN GRAFT RIGHT FOOT;  Surgeon: Newt Minion, MD;  Location: [facility];  Service: Orthopedics;  Laterality: Right;    History reviewed. No pertinent family history.  Social History:  reports that she quit smoking about 6 years ago. She has never used smokeless tobacco. She reports that she drinks about 0.5 oz of alcohol per week. She reports that she does not use illicit drugs.  Allergies:  Allergies  Allergen Reactions  . Ciprofloxacin Itching  . Cleocin [Clindamycin Hcl]     Diarrhea     Medications: I have reviewed the patient's current medications.  Results for orders placed or performed during the hospital encounter of 09/01/14 (from the past 48 hour(s))  Glucose, capillary     Status: Abnormal   Collection Time: 09/01/14 10:07 PM  Result  Value Ref Range   Glucose-Capillary 129 (H) 65 - 99 mg/dL  Fecal lactoferrin     Status: None   Collection Time: 09/02/14  2:07 AM  Result Value Ref Range   Specimen Description STOOL    Special Requests NONE    Fecal Lactoferrin POSITIVE Performed at [facility]     Report Status 09/03/2014 FINAL   Clostridium Difficile by PCR     Status: Abnormal   Collection Time: 09/02/14  2:07 AM  Result Value Ref Range   C difficile by pcr POSITIVE (A) NEGATIVE    Comment: CRITICAL RESULT CALLED TO, READ BACK BY AND VERIFIED WITH: Mirian Mo RN @ [phone] ON 09/02/14 BY C DAVIS   Stool culture     Status: None (Preliminary result)   Collection Time: 09/02/14  2:07 AM  Result Value Ref Range   Specimen Description STOOL    Special Requests NONE    Culture      Culture reincubated for better growth Performed at [facility]    Report Status PENDING   Culture, blood (x 2)     Status: None (Preliminary result)   Collection Time: 09/02/14  5:26 AM  Result Value Ref Range   Specimen Description BLOOD LEFT HAND    Special Requests BOTTLES DRAWN AEROBIC ONLY 1CC    Culture  BLOOD CULTURE RECEIVED NO GROWTH TO DATE CULTURE WILL BE HELD FOR 5 DAYS BEFORE ISSUING A FINAL NEGATIVE REPORT Performed at [facility]    Report Status PENDING   CBC     Status: Abnormal   Collection Time: 09/02/14  5:26 AM  Result Value Ref Range   WBC 12.9 (H) 4.0 - 10.5 K/uL   RBC 3.52 (L) 3.87 - 5.11 MIL/uL   Hemoglobin 9.9 (L) 12.0 - 15.0 g/dL   HCT 29.7 (L) 36.0 - 46.0 %   MCV 84.4 78.0 - 100.0 fL   MCH 28.1 26.0 - 34.0 pg   MCHC 33.3 30.0 - 36.0 g/dL   RDW 16.1 (H) 11.5 - 15.5 %   Platelets 413 (H) 150 - 400 K/uL  Comprehensive metabolic panel     Status: Abnormal   Collection Time: 09/02/14  5:26 AM  Result Value Ref Range   Sodium 136 135 - 145 mmol/L   Potassium 3.7 3.5 - 5.1 mmol/L   Chloride 114 (H) 101 - 111 mmol/L   CO2 13 (L) 22 - 32 mmol/L   Glucose, Bld 160  (H) 65 - 99 mg/dL   BUN 41 (H) 6 - 20 mg/dL   Creatinine, Ser 1.65 (H) 0.44 - 1.00 mg/dL   Calcium 7.1 (L) 8.9 - 10.3 mg/dL   Total Protein 4.9 (L) 6.5 - 8.1 g/dL   Albumin 1.9 (L) 3.5 - 5.0 g/dL   AST 17 15 - 41 U/L   ALT 35 14 - 54 U/L   Alkaline Phosphatase 110 38 - 126 U/L   Total Bilirubin 0.2 (L) 0.3 - 1.2 mg/dL   GFR calc non Af Amer 37 (L) >60 mL/min   GFR calc Af Amer 43 (L) >60 mL/min    Comment: (NOTE) The eGFR has been calculated using the CKD EPI equation. This calculation has not been validated in all clinical situations. eGFR's persistently <60 mL/min signify possible Chronic Kidney Disease.    Anion gap 9 5 - 15  Glucose, capillary     Status: Abnormal   Collection Time: 09/02/14  8:32 AM  Result Value Ref Range   Glucose-Capillary 221 (H) 65 - 99 mg/dL   Comment 1 Notify RN    Comment 2 Document in Chart   Glucose, capillary     Status: Abnormal   Collection Time: 09/02/14 11:47 AM  Result Value Ref Range   Glucose-Capillary 243 (H) 65 - 99 mg/dL   Comment 1 Notify RN    Comment 2 Document in Chart   Glucose, capillary     Status: Abnormal   Collection Time: 09/02/14  5:22 PM  Result Value Ref Range   Glucose-Capillary 149 (H) 65 - 99 mg/dL   Comment 1 Notify RN    Comment 2 Document in Chart   Glucose, capillary     Status: Abnormal   Collection Time: 09/02/14  9:18 PM  Result Value Ref Range   Glucose-Capillary 285 (H) 65 - 99 mg/dL  Glucose, capillary     Status: Abnormal   Collection Time: 09/03/14  2:59 AM  Result Value Ref Range   Glucose-Capillary 300 (H) 65 - 99 mg/dL  Basic metabolic panel     Status: Abnormal   Collection Time: 09/03/14  4:49 AM  Result Value Ref Range   Sodium 137 135 - 145 mmol/L   Potassium 3.4 (L) 3.5 - 5.1 mmol/L   Chloride 112 (H) 101 - 111 mmol/L   CO2 16 (L) 22 -  32 mmol/L   Glucose, Bld 266 (H) 65 - 99 mg/dL   BUN 34 (H) 6 - 20 mg/dL   Creatinine, Ser 0.13 (H) 0.44 - 1.00 mg/dL   Calcium 7.7 (L) 8.9 - 10.3  mg/dL   GFR calc non Af Amer 40 (L) >60 mL/min   GFR calc Af Amer 46 (L) >60 mL/min    Comment: (NOTE) The eGFR has been calculated using the CKD EPI equation. This calculation has not been validated in all clinical situations. eGFR's persistently <60 mL/min signify possible Chronic Kidney Disease.    Anion gap 9 5 - 15  CBC     Status: Abnormal   Collection Time: 09/03/14  4:49 AM  Result Value Ref Range   WBC 9.5 4.0 - 10.5 K/uL   RBC 3.37 (L) 3.87 - 5.11 MIL/uL   Hemoglobin 9.6 (L) 12.0 - 15.0 g/dL   HCT 14.3 (L) 88.8 - 75.7 %   MCV 85.2 78.0 - 100.0 fL   MCH 28.5 26.0 - 34.0 pg   MCHC 33.4 30.0 - 36.0 g/dL   RDW 97.2 (H) 82.0 - 60.1 %   Platelets 448 (H) 150 - 400 K/uL  Glucose, capillary     Status: Abnormal   Collection Time: 09/03/14  7:40 AM  Result Value Ref Range   Glucose-Capillary 105 (H) 65 - 99 mg/dL   Comment 1 Notify RN    Comment 2 Document in Chart   Glucose, capillary     Status: Abnormal   Collection Time: 09/03/14 12:08 PM  Result Value Ref Range   Glucose-Capillary 118 (H) 65 - 99 mg/dL   Comment 1 Notify RN    Comment 2 Document in Chart   Glucose, capillary     Status: Abnormal   Collection Time: 09/03/14  4:53 PM  Result Value Ref Range   Glucose-Capillary 146 (H) 65 - 99 mg/dL   Comment 1 Notify RN    Comment 2 Document in Chart     Dg Foot Complete Left  09/02/2014   CLINICAL DATA:  Diabetic foot ulcer  EXAM: LEFT FOOT - COMPLETE 3+ VIEW  COMPARISON:  05/26/2012  FINDINGS: Three views of the left foot submitted. There is diffuse osteopenia. No acute fracture or subluxation. No bone destruction to suggest osteomyelitis.  IMPRESSION: No acute fracture or subluxation. Diffuse osteopenia. No definite evidence of osteomyelitis   Electronically Signed   By: Natasha Mead M.D.   On: 09/02/2014 13:16   Dg Foot Complete Right  09/02/2014   CLINICAL DATA:  Diabetic foot ulcer  EXAM: RIGHT FOOT COMPLETE - 3+ VIEW  COMPARISON:  06/02/2014  FINDINGS: Three views  of the right foot submitted. There is diffuse osteopenia. There is osteolysis and cortical loss distal aspect of fourth metatarsal and base of proximal phalanx fourth toe. Findings are highly suspicious for osteomyelitis. Clinical correlation is necessary. Further correlation with MRI is recommended.  IMPRESSION: There is diffuse osteopenia. There is osteolysis and cortical loss distal aspect of fourth metatarsal and base of proximal phalanx fourth toe. Findings are highly suspicious for osteomyelitis. Clinical correlation is necessary. Further correlation with MRI is recommended.   Electronically Signed   By: Natasha Mead M.D.   On: 09/02/2014 13:15    Review of Systems  Constitutional: Positive for fever, chills and malaise/fatigue.  HENT: Negative for ear discharge.   Eyes: Negative for blurred vision.  Respiratory: Negative for cough.   Cardiovascular: Negative for chest pain.  Gastrointestinal: Negative for heartburn.  Genitourinary: Positive for urgency and frequency.  Neurological: Positive for weakness. Negative for tingling.       Peripheral neuropathy. Had shoes ordered at one time , paper work problems per pt and never got her diabetic shoes.   Endo/Heme/Allergies: Negative.   Psychiatric/Behavioral: Negative for hallucinations.   Blood pressure 125/65, pulse 73, temperature 99.1 F (37.3 C), temperature source Oral, resp. rate 18, height $RemoveBe'5\' 7"'[id number]$  (1.702 m), weight 59.875 kg (132 lb), last menstrual period 07/13/2014, SpO2 98 %. Physical Exam  Constitutional: She is oriented to person, place, and time.  Thin, pleasant , very talkative.   HENT:  Head: Normocephalic.  Eyes: Pupils are equal, round, and reactive to light.  Neck: Normal range of motion.  Cardiovascular: Normal rate.   Respiratory: Effort normal.  GI: Soft.  Musculoskeletal:  Bilateral plantar foot Wagner ulcer, grade 4 on left foot and grade 2 or 3 on right foot with right leg cellulitis to knee with erythema.    Neurological: She is alert and oriented to person, place, and time.  Skin: There is erythema.  Psychiatric: She has a normal mood and affect. Her behavior is normal.    Assessment/Plan: Will add MRI of right foot to previous already ordered MRI of left foot. Will need surgery on right foot and will need to make sure there is not further extension that seen on plain xray with 4th MTP joint  Infection.      Will plan on possible surgery in a couple days.  Datto C 09/03/2014, 7:57 PM

## 2014-09-03 NOTE — Progress Notes (Signed)
Inpatient Diabetes Program Recommendations  AACE/ADA: New Consensus Statement on Inpatient Glycemic Control (2013)  Target Ranges:  Prepandial:   less than 140 mg/dL      Peak postprandial:   less than 180 mg/dL (1-2 hours)      Critically ill patients:  140 - 180 mg/dL   Basal rates: 2400-0600 @ 0.325 units/hr 0600-1200 @ 0.4 units/hr 1200-1700 @ 0.6 units/hr 1700-2200 @ 0.5 units/hr 2200-2400 @ 0.6 units/hr Total basal of 10.45 units/day  Bolus wizard settings: Insulin to cho ratio: 1:15 Sensitivity factor : 90 Target glucose goal: 150-150 mg/dL  Results for [name], [name] (MRN [id number]) as of 09/03/2014 13:16  Ref. Range 09/03/2014 04:49  Sodium Latest Ref Range: 135-145 mmol/L 137  Potassium Latest Ref Range: 3.5-5.1 mmol/L 3.4 (L)  Chloride Latest Ref Range: 101-111 mmol/L 112 (H)  CO2 Latest Ref Range: 22-32 mmol/L 16 (L)  BUN Latest Ref Range: 6-20 mg/dL 34 (H)  Creatinine Latest Ref Range: 0.44-1.00 mg/dL 1.56 (H)  Calcium Latest Ref Range: 8.9-10.3 mg/dL 7.7 (L)  EGFR (Non-African Amer.) Latest Ref Range: >60 mL/min 40 (L)  EGFR (African American) Latest Ref Range: >60 mL/min 46 (L)  Glucose Latest Ref Range: 65-99 mg/dL 266 (H)  Anion gap Latest Ref Range: 5-15  9   Results for [name], [name] (MRN [id number]) as of 09/03/2014 13:16  Ref. Range 09/02/2014 08:32 09/02/2014 11:47 09/02/2014 17:22 09/02/2014 21:18 09/03/2014 02:59  Glucose-Capillary Latest Ref Range: 65-99 mg/dL 221 (H) 243 (H) 149 (H) 285 (H) 300 (H)   Blood sugars elevated. Will need to bolus more often if blood sugars continue to rise.  Will talk with pt.  Thank you. Lorenda Peck, RD, LDN, CDE Inpatient Diabetes Coordinator [phone]

## 2014-09-03 NOTE — Progress Notes (Signed)
Pt refused p.o Flagyl this morning states it makes her diarrhea worse.

## 2014-09-04 ENCOUNTER — Inpatient Hospital Stay (HOSPITAL_COMMUNITY): Payer: Medicare Other

## 2014-09-04 LAB — CBC
HCT: 30.2 % — ABNORMAL LOW (ref 36.0–46.0)
Hemoglobin: 10 g/dL — ABNORMAL LOW (ref 12.0–15.0)
MCH: 27.9 pg (ref 26.0–34.0)
MCHC: 33.1 g/dL (ref 30.0–36.0)
MCV: 84.4 fL (ref 78.0–100.0)
Platelets: 484 10*3/uL — ABNORMAL HIGH (ref 150–400)
RBC: 3.58 MIL/uL — ABNORMAL LOW (ref 3.87–5.11)
RDW: 16.9 % — AB (ref 11.5–15.5)
WBC: 5.7 10*3/uL (ref 4.0–10.5)

## 2014-09-04 LAB — BASIC METABOLIC PANEL
Anion gap: 4 — ABNORMAL LOW (ref 5–15)
BUN: 18 mg/dL (ref 6–20)
CALCIUM: 7.6 mg/dL — AB (ref 8.9–10.3)
CHLORIDE: 120 mmol/L — AB (ref 101–111)
CO2: 16 mmol/L — ABNORMAL LOW (ref 22–32)
CREATININE: 1.23 mg/dL — AB (ref 0.44–1.00)
GFR calc non Af Amer: 53 mL/min — ABNORMAL LOW (ref >60)
Glucose, Bld: 91 mg/dL (ref 65–99)
Potassium: 4.1 mmol/L (ref 3.5–5.1)
SODIUM: 140 mmol/L (ref 135–145)

## 2014-09-04 LAB — GI PATHOGEN PANEL BY PCR, STOOL
Campylobacter by PCR: NOT DETECTED
Cryptosporidium by PCR: NOT DETECTED
E coli (ETEC) LT/ST: NOT DETECTED
E coli (STEC): NOT DETECTED
E coli 0157 by PCR: NOT DETECTED
G lamblia by PCR: NOT DETECTED
Norovirus GI/GII: NOT DETECTED
ROTAVIRUS A BY PCR: NOT DETECTED
SALMONELLA BY PCR: NOT DETECTED
Shigella by PCR: NOT DETECTED

## 2014-09-04 LAB — GLUCOSE, CAPILLARY
GLUCOSE-CAPILLARY: 111 mg/dL — AB (ref 65–99)
GLUCOSE-CAPILLARY: 125 mg/dL — AB (ref 65–99)
GLUCOSE-CAPILLARY: 350 mg/dL — AB (ref 65–99)
Glucose-Capillary: 182 mg/dL — ABNORMAL HIGH (ref 65–99)
Glucose-Capillary: 251 mg/dL — ABNORMAL HIGH (ref 65–99)

## 2014-09-04 LAB — VANCOMYCIN, TROUGH: VANCOMYCIN TR: 22 ug/mL — AB (ref 10.0–20.0)

## 2014-09-04 MED ORDER — VANCOMYCIN 50 MG/ML ORAL SOLUTION
250.0000 mg | Freq: Four times a day (QID) | ORAL | Status: DC
  Filled 2014-09-04 (×3): qty 5

## 2014-09-04 MED ORDER — VANCOMYCIN HCL IN DEXTROSE 750-5 MG/150ML-% IV SOLN
750.0000 mg | INTRAVENOUS | Status: DC
  Administered 2014-09-04 – 2014-09-06 (×2): 750 mg via INTRAVENOUS
  Filled 2014-09-04 (×2): qty 150

## 2014-09-04 MED ORDER — VANCOMYCIN 50 MG/ML ORAL SOLUTION
500.0000 mg | Freq: Four times a day (QID) | ORAL | Status: DC
  Administered 2014-09-04 – 2014-09-06 (×9): 500 mg via ORAL
  Filled 2014-09-04 (×11): qty 10

## 2014-09-04 NOTE — Consult Note (Signed)
[facility]  Total days of antibiotics 4        Day 4 ceftriaxone        Day 2 oral vanco        Day 4 iv vanco       Reason for Consult: cdiff, diabetic foot osteomyelitis    Referring Physician: Tana Coast  Principal Problem:   Sepsis Active Problems:   Type 1 diabetes mellitus with neurological manifestations, uncontrolled   Charcot foot due to diabetes mellitus   UTI (urinary tract infection)   Osteomyelitis   CKD (chronic kidney disease), stage III   Diabetic ulcer of right foot   AKI (acute kidney injury)   Cellulitis   Nausea and vomiting   Diarrhea    HPI: [name] is a 44 y.o. female Patient is a 44 year old female with type 1 diabetes mellitus c/b diabetic neuropathy, bilateral foot ulcers. She presented to the ED on  6/1 with c/o diarrhea x 4-5 days complicated with abdominal pain, nausea and vomiting. She had recently finished a course of doxycycline roughly 3 weeks ago. She also was at the wound care center the day of admit where it was noted that her left lower leg erythema, with pain. She is followed at wound care center with right foot vac for previous ulcer and left foot 4th MT plantar ulcer. On admit, she alos complained of urinary frequency. In ED, patient was noticed to have tachycardia with heart rate of 130, BP 199/52, WBCs 19.9, creatinine 1.8, glucose 358, UA positive for UTI.  She was started on vanco, ceftriaxone, metronidazole. Since diarrhea did not improve, and also found to have cdifficile +, he was changed to oral vancomycin from metronidazole. SHe underwent bilateral foot mri, which showed myositis, cellulitis to the left foot but on the right foot had evidence of septic arthritis/osteo of 3rd and 4th MT head. Dr. Lorin Mercy consulted for further management. Her leukocytosis has greatly improved but her diarrhea is still causing her problems. She has been needeiing a flexyseal rectal tube. Her oral vanco increased to $RemoveBefo'500mg'[id number]$  QID  today  Past Medical History  Diagnosis Date  . Diabetes mellitus   . Neuropathy in diabetes   . Hypertension   . Hypercholesteremia   . H/O seasonal allergies   . Anemia   . Left foot infection     Allergies:  Allergies  Allergen Reactions  . Ciprofloxacin Itching  . Cleocin [Clindamycin Hcl]     Diarrhea    MEDICATIONS: . calcitRIOL  0.25 mcg Oral Once per day on Mon Wed Fri  . cefTRIAXone (ROCEPHIN)  IV  1 g Intravenous Q24H  . feeding supplement (RESOURCE BREEZE)  1 Container Oral BID BM  . ferrous sulfate  325 mg Oral BID WC  . folic acid  1 mg Oral Daily  . heparin  5,000 Units Subcutaneous 3 times per day  . insulin aspart  10 Units Intravenous Once  . insulin pump   Subcutaneous TID AC, HS, 0200  . ondansetron (ZOFRAN) IV  4 mg Intravenous Once  . pravastatin  20 mg Oral q1800  . pyridoxine  100 mg Oral Daily  . saccharomyces boulardii  500 mg Oral BID  . sodium chloride  3 mL Intravenous Q12H  . vancomycin  500 mg Oral Q6H  . vancomycin  750 mg Intravenous Q24H  . vitamin B-12  100 mcg Oral Daily    History  Substance Use Topics  . Smoking status: Former  Smoker    Quit date: 05/16/2008  . Smokeless tobacco: Never Used  . Alcohol Use: 0.5 oz/week    1 drink(s) per week     Comment: Occasional    History reviewed. No pertinent family history.  Review of Systems  Constitutional: Negative for fever, chills, diaphoresis, activity change, appetite change, fatigue and unexpected weight change.  HENT: Negative for congestion, sore throat, rhinorrhea, sneezing, trouble swallowing and sinus pressure.  Eyes: Negative for photophobia and visual disturbance.  Respiratory: Negative for cough, chest tightness, shortness of breath, wheezing and stridor.  Cardiovascular: Negative for chest pain, palpitations and leg swelling.  Gastrointestinal: + diarrhea. for nausea, vomiting, abdominal pain,  blood in stool, abdominal distention and anal bleeding.  Genitourinary:  Negative for dysuria, hematuria, flank pain and difficulty urinating.  Musculoskeletal: Negative for myalgias, back pain, joint swelling, arthralgias and gait problem.  Skin: + left leg redness and sensitive  Neurological: Negative for dizziness, tremors, weakness and light-headedness.  Hematological: Negative for adenopathy. Does not bruise/bleed easily.  Psychiatric/Behavioral: Negative for behavioral problems, confusion, sleep disturbance, dysphoric mood, decreased concentration and agitation.      OBJECTIVE: Temp:  [98 F (36.7 C)-99.1 F (37.3 C)] 98.4 F (36.9 C) (06/04 1300) Pulse Rate:  [73-81] 74 (06/04 1300) Resp:  [18] 18 (06/04 1300) BP: (125-142)/(65-75) 132/70 mmHg (06/04 1300) SpO2:  [98 %] 98 % (06/04 1300) Weight:  [132 lb (59.875 kg)] 132 lb (59.875 kg) (06/04 1015) Physical Exam  Constitutional:  oriented to person, place, and time. appears well-developed and well-nourished. No distress.  HENT: /AT, PERRLA, no scleral icterus Mouth/Throat: Oropharynx is clear and moist. No oropharyngeal exudate.  Cardiovascular: Normal rate, regular rhythm and normal heart sounds. Exam reveals no gallop and no friction rub.  No murmur heard.  Pulmonary/Chest: Effort normal and breath sounds normal. No respiratory distress.  has no wheezes.  Neck = supple, no nuchal rigidity Abdominal: Soft. Bowel sounds are normal.  exhibits no distension. There is no tenderness.  Lymphadenopathy: no cervical adenopathy. No axillary adenopathy Neurological: alert and oriented to person, place, and time.  Skin: left lower leg still has blanching erythema, sensitive to touch. Left plantar ulcer is 2.3 cm, 0.5 cm deep, clean wound bed Ext: trace edema to left leg Psychiatric: a normal mood and affect.  behavior is normal.    LABS: Results for orders placed or performed during the hospital encounter of 09/01/14 (from the past 48 hour(s))  Glucose, capillary     Status: Abnormal   Collection  Time: 09/02/14  5:22 PM  Result Value Ref Range   Glucose-Capillary 149 (H) 65 - 99 mg/dL   Comment 1 Notify RN    Comment 2 Document in Chart   Glucose, capillary     Status: Abnormal   Collection Time: 09/02/14  9:18 PM  Result Value Ref Range   Glucose-Capillary 285 (H) 65 - 99 mg/dL  Glucose, capillary     Status: Abnormal   Collection Time: 09/03/14  2:59 AM  Result Value Ref Range   Glucose-Capillary 300 (H) 65 - 99 mg/dL  Basic metabolic panel     Status: Abnormal   Collection Time: 09/03/14  4:49 AM  Result Value Ref Range   Sodium 137 135 - 145 mmol/L   Potassium 3.4 (L) 3.5 - 5.1 mmol/L   Chloride 112 (H) 101 - 111 mmol/L   CO2 16 (L) 22 - 32 mmol/L   Glucose, Bld 266 (H) 65 - 99 mg/dL   BUN 34 (  H) 6 - 20 mg/dL   Creatinine, Ser 1.56 (H) 0.44 - 1.00 mg/dL   Calcium 7.7 (L) 8.9 - 10.3 mg/dL   GFR calc non Af Amer 40 (L) >60 mL/min   GFR calc Af Amer 46 (L) >60 mL/min    Comment: (NOTE) The eGFR has been calculated using the CKD EPI equation. This calculation has not been validated in all clinical situations. eGFR's persistently <60 mL/min signify possible Chronic Kidney Disease.    Anion gap 9 5 - 15  CBC     Status: Abnormal   Collection Time: 09/03/14  4:49 AM  Result Value Ref Range   WBC 9.5 4.0 - 10.5 K/uL   RBC 3.37 (L) 3.87 - 5.11 MIL/uL   Hemoglobin 9.6 (L) 12.0 - 15.0 g/dL   HCT 28.7 (L) 36.0 - 46.0 %   MCV 85.2 78.0 - 100.0 fL   MCH 28.5 26.0 - 34.0 pg   MCHC 33.4 30.0 - 36.0 g/dL   RDW 16.6 (H) 11.5 - 15.5 %   Platelets 448 (H) 150 - 400 K/uL  Glucose, capillary     Status: Abnormal   Collection Time: 09/03/14  7:40 AM  Result Value Ref Range   Glucose-Capillary 105 (H) 65 - 99 mg/dL   Comment 1 Notify RN    Comment 2 Document in Chart   Glucose, capillary     Status: Abnormal   Collection Time: 09/03/14 12:08 PM  Result Value Ref Range   Glucose-Capillary 118 (H) 65 - 99 mg/dL   Comment 1 Notify RN    Comment 2 Document in Chart   Glucose,  capillary     Status: Abnormal   Collection Time: 09/03/14  4:53 PM  Result Value Ref Range   Glucose-Capillary 146 (H) 65 - 99 mg/dL   Comment 1 Notify RN    Comment 2 Document in Chart   Glucose, capillary     Status: None   Collection Time: 09/03/14  9:37 PM  Result Value Ref Range   Glucose-Capillary 73 65 - 99 mg/dL  Glucose, capillary     Status: Abnormal   Collection Time: 09/04/14  2:15 AM  Result Value Ref Range   Glucose-Capillary 111 (H) 65 - 99 mg/dL  Basic metabolic panel     Status: Abnormal   Collection Time: 09/04/14  4:56 AM  Result Value Ref Range   Sodium 140 135 - 145 mmol/L   Potassium 4.1 3.5 - 5.1 mmol/L    Comment: DELTA CHECK NOTED REPEATED TO VERIFY NO VISIBLE HEMOLYSIS    Chloride 120 (H) 101 - 111 mmol/L   CO2 16 (L) 22 - 32 mmol/L   Glucose, Bld 91 65 - 99 mg/dL   BUN 18 6 - 20 mg/dL   Creatinine, Ser 1.23 (H) 0.44 - 1.00 mg/dL   Calcium 7.6 (L) 8.9 - 10.3 mg/dL   GFR calc non Af Amer 53 (L) >60 mL/min   GFR calc Af Amer >60 >60 mL/min    Comment: (NOTE) The eGFR has been calculated using the CKD EPI equation. This calculation has not been validated in all clinical situations. eGFR's persistently <60 mL/min signify possible Chronic Kidney Disease.    Anion gap 4 (L) 5 - 15  CBC     Status: Abnormal   Collection Time: 09/04/14  4:56 AM  Result Value Ref Range   WBC 5.7 4.0 - 10.5 K/uL   RBC 3.58 (L) 3.87 - 5.11 MIL/uL   Hemoglobin 10.0 (L) 12.0 -  15.0 g/dL   HCT 30.2 (L) 36.0 - 46.0 %   MCV 84.4 78.0 - 100.0 fL   MCH 27.9 26.0 - 34.0 pg   MCHC 33.1 30.0 - 36.0 g/dL   RDW 16.9 (H) 11.5 - 15.5 %   Platelets 484 (H) 150 - 400 K/uL  Glucose, capillary     Status: Abnormal   Collection Time: 09/04/14  7:37 AM  Result Value Ref Range   Glucose-Capillary 125 (H) 65 - 99 mg/dL  Glucose, capillary     Status: Abnormal   Collection Time: 09/04/14 11:58 AM  Result Value Ref Range   Glucose-Capillary 182 (H) 65 - 99 mg/dL  Vancomycin, trough      Status: Abnormal   Collection Time: 09/04/14  1:41 PM  Result Value Ref Range   Vancomycin Tr 22 (H) 10.0 - 20.0 ug/mL    MICRO: 6/1 urcx = ecoli pan sensitive except R bactrim 6/2 cdifficile + 6/2 blood cx ngtd IMAGING: Mr Foot Right Wo Contrast  09/04/2014   CLINICAL DATA:  Open wound on the plantar aspect of the foot and osteomyelitis suggested on recent plain films.  EXAM: MRI OF THE RIGHT FOREFOOT WITHOUT CONTRAST  TECHNIQUE: Multiplanar, multisequence MR imaging was performed. No intravenous contrast was administered.  COMPARISON:  Radiographs 09/02/2014  FINDINGS: As demonstrated on the radiographs the fourth metatarsal head and fourth proximal phalanx are destroyed by infection. There is significant marrow edema in and around the fourth metatarsal phalangeal joint and adjacent bones. Septic arthritis and osteoarthritis the most likely cause.  There is also septic arthritis and osteomyelitis involving the third metatarsal phalangeal joint and adjacent bones. The other metatarsal phalangeal joints and bony structures are intact.  There is a large open wound on the plantar aspect of the foot with severe cellulitis and myositis. Probable abscess surrounding the fourth metatarsal phalangeal joint.  IMPRESSION: MR findings consistent with septic arthritis and osteomyelitis involving the third and fourth metatarsal phalangeal joints as described above.  Open wound on the plantar aspect of the foot with diffuse cellulitis and myositis. Probable abscess surrounding the fourth metatarsal phalangeal joint.   Electronically Signed   By: Marijo Sanes M.D.   On: 09/04/2014 10:54   Mr Foot Left Wo Contrast  09/04/2014   CLINICAL DATA:  Diabetic foot ulcer on the plantar aspect of the foot near the base of the fifth metatarsal.  EXAM: MRI OF THE LEFT FOREFOOT WITHOUT CONTRAST  TECHNIQUE: Multiplanar, multisequence MR imaging was performed. No intravenous contrast was administered.  COMPARISON:  Radiograph  09/02/2014  FINDINGS: There is an open wound on the plantar aspect of the foot measuring a maximum of 12 mm. This is located near the base of the fourth metatarsal. I do not see a discrete soft tissue abscess. There is mild cellulitis and moderate myositis but no discrete drainable fluid collection. I do not see any definite findings for septic arthritis or osteomyelitis.  Small focal area of marrow edema in the proximal cuboid is likely a stress reaction. I do not think this represents osteomyelitis and I do not see a definite stress fracture.  IMPRESSION: Open wound on the plantar aspect of the foot with focal cellulitis and moderate myositis but no discrete drainable soft tissue abscess.  No MR findings to suggest septic arthritis or osteomyelitis.   Electronically Signed   By: Marijo Sanes M.D.   On: 09/04/2014 11:14    HISTORICAL MICRO/IMAGING  Assessment/Plan:  44yo F with IDDM c/b diabetic neuropathy  and diabetic foot ulcer/osteomyelitis, presented with SIRS, found to  c.difficile and cellulitis of left foot. On broad spectrum  1) asymptomatic bacteriuria= will discontinue ceftriaxone. Patient reports her frequency is from her diabetes. Given that we are trying to treat her c.difficile nad she has received 3 days of ceftriaxone, will not need further treatment 2) cellulitis = continue on IV vancomycin 3) diabetic osteo = will continue on IV vancomycin, will watch for renal changes and dose accordingly 4) c.difficile diarrhea = will treat her with high dose oral vanco $RemoveBe'500mg'[id number]$  QID to see if there is any further response. Ideally want to stop any other offending agents and focus on cdifficile infection. Although in this scenario, cellulitis/osteo also needs treatment. If no response to high dose over the next few days, then can try [place]. [facility] [phone]

## 2014-09-04 NOTE — Progress Notes (Signed)
[facility]                                                                              Patient Demographics  [name], is a 44 y.o. female, DOB - Aug 24, 1970, MD:[id number]  Admit date - 09/01/2014   Admitting Physician Jasa Dundon Krystal Eaton, MD  Outpatient Primary MD for the patient is Hayden Rasmussen., MD  LOS - 3   Chief Complaint  Patient presents with  . Emesis  . Diarrhea       Brief HPI  Patient is a 44 year old female with type 1 diabetes mellitus on insulin pump, diabetic neuropathy, bilateral foot ulcers, left foot ulcer, currently under treatment with a wound VAC Presented to ED from the wound care center. Patient reported that she was having diarrhea for last 4-5 days, She was on doxycycline for the left foot wound, finished 3 weeks ago. She denied any abdominal pain however had nausea and vomiting, multiple episodes this morning. Patient reports that the diarrhea has almost resolved since last night. She went to the wound care center on the day of admission and was also noticed to have left lower extremity erythema and pain which was new. She has a left foot ulcer as well which is being treated.  Patient also reported that for the past 1 week urine has been cloudy, increased frequency and urgency and has been drinking cranberry juice. She reported subjective fevers and chills. In ED, patient was noticed to have tachycardia with heart rate of 130, BP 199/52, WBCs 19.9, creatinine 1.8, glucose 358, UA positive for UTI     Assessment & Plan    Principal Problem:  Sepsis with metabolic acidosis, leukocytosis: Due to left lower extremity cellulitis, right foot osteomyelitis, Escherichia coli UTI UTI and C. difficile colitis - Elevated pro-calcitonin, follow blood cultures, urine culture showed Escherichia coli - Stool for C. difficile positive, no improvement with oral Flagyl, had placed on oral vancomycin, still having significant diarrhea - Placed on  IV vancomycin, for left lower extremity cellulitis, IV Rocephin for UTI,   Active Problems: Left lower extremity cellulitis: Improving  - On IV vancomycin - Doppler ultrasound negative for DVT  - Left foot x-ray shows diffuse osteopenia but no definite evidence of possible pneumonitis  Right foot osteomyelitis, diabetic foot ulcer Right foot x-ray shows ostial lyses and cortical loss distal aspect of fourth metatarsal and base of proximal phalanx fourth toe, suspicious for osteomyelitis. Patient had MRI in 3/16 which had not shown any osteomyelitis at that time - Orthopedics consulted Dr. Lorin Mercy, recommended MRI of the right foot and left foot, will likely need surgery on the right foot - Continue IV vancomycin   Intractable diarrhea; due to C. difficile colitis - No significant improvement with Flagyl, was placed on oral vancomycin however patient is having significant amount of diarrhea - place Flexiseal, ID consult called, discussed with Dr. Graylon Good, recommended increase oral vancomycin to 500mg  qid    Type 1 diabetes mellitus with neurological manifestations, uncontrolled - Patient wants to continue with insulin pump, placed orders Hemoglobin A1c 9.7  Escherichia coli UTI (urinary tract infection) - continue IV Rocephin  Mild AK I on CKD (chronic kidney disease), stage III: Likely due to #1  -Baseline creatinine 1.7, improving today   Code Status: Full code  Family Communication: Discussed in detail with the patient, all imaging results, lab results explained to the patient   Disposition Plan:   Time Spent in minutes  25 minutes  Procedures  None  Consults   Wound care Orthopedics Infectious disease  DVT Prophylaxis  heparin subcutaneous   Medications  Scheduled Meds: . calcitRIOL  0.25 mcg Oral Once per day on Mon Wed Fri  . cefTRIAXone (ROCEPHIN)  IV  1 g Intravenous Q24H  . feeding supplement (RESOURCE BREEZE)  1 Container Oral BID BM  . ferrous  sulfate  325 mg Oral BID WC  . folic acid  1 mg Oral Daily  . heparin  5,000 Units Subcutaneous 3 times per day  . insulin aspart  10 Units Intravenous Once  . insulin pump   Subcutaneous TID AC, HS, 0200  . ondansetron (ZOFRAN) IV  4 mg Intravenous Once  . pravastatin  20 mg Oral q1800  . pyridoxine  100 mg Oral Daily  . saccharomyces boulardii  500 mg Oral BID  . sodium chloride  3 mL Intravenous Q12H  . vancomycin  500 mg Oral Q6H  . vancomycin  1,000 mg Intravenous Q24H  . vitamin B-12  100 mcg Oral Daily   Continuous Infusions: . sodium chloride 100 mL/hr at 09/03/14 2120   PRN Meds:.acetaminophen **OR** acetaminophen, albuterol, alum & mag hydroxide-simeth, diphenhydrAMINE, HYDROcodone-acetaminophen, HYDROmorphone (DILAUDID) injection, ondansetron **OR** ondansetron (ZOFRAN) IV, sodium chloride   Antibiotics   Anti-infectives    Start     Dose/Rate Route Frequency Ordered Stop   09/04/14 1000  vancomycin (VANCOCIN) 50 mg/mL oral solution 250 mg  Status:  Discontinued     250 mg Oral 4 times daily 09/04/14 0749 09/04/14 0754   09/04/14 1000  vancomycin (VANCOCIN) 50 mg/mL oral solution 500 mg     500 mg Oral Every 6 hours 09/04/14 0754     09/03/14 1400  vancomycin (VANCOCIN) 50 mg/mL oral solution 125 mg  Status:  Discontinued     125 mg Oral 4 times daily 09/03/14 1150 09/04/14 0749   09/02/14 1600  cefTRIAXone (ROCEPHIN) 1 g in dextrose 5 % 50 mL IVPB - Premix     1 g 100 mL/hr over 30 Minutes Intravenous Every 24 hours 09/01/14 1708     09/02/14 1400  vancomycin (VANCOCIN) IVPB 1000 mg/200 mL premix     1,000 mg 200 mL/hr over 60 Minutes Intravenous Every 24 hours 09/01/14 1254     09/02/14 0500  metroNIDAZOLE (FLAGYL) tablet 500 mg  Status:  Discontinued     500 mg Oral 3 times per day 09/02/14 0445 09/03/14 1149   09/01/14 1430  cefTRIAXone (ROCEPHIN) 1 g in dextrose 5 % 50 mL IVPB     1 g 100 mL/hr over 30 Minutes Intravenous  Once 09/01/14 1415 09/01/14 1628    09/01/14 1215  vancomycin (VANCOCIN) 1,250 mg in sodium chloride 0.9 % 250 mL IVPB     1,250 mg 166.7 mL/hr over 90 Minutes Intravenous STAT 09/01/14 1213 09/01/14 1404        Subjective:   [name] was seen and examined today. Patient frustrated with intractable diarrhea, understands difficult situation with other infections and C. difficile. Patient denies dizziness, chest pain, shortness of breath, abdominal pain, new weakness, numbess, tingling.  Objective:   Blood pressure 142/75, pulse  73, temperature 98 F (36.7 C), temperature source Oral, resp. rate 18, height 5\' 7"  (1.702 m), weight 59.875 kg (132 lb), last menstrual period 07/13/2014, SpO2 98 %.  Wt Readings from Last 3 Encounters:  09/01/14 59.875 kg (132 lb)  09/04/14 59.875 kg (132 lb)  08/16/14 59.875 kg (132 lb)     Intake/Output Summary (Last 24 hours) at 09/04/14 1040 Last data filed at 09/04/14 0700  Gross per 24 hour  Intake   2800 ml  Output      0 ml  Net   2800 ml    Exam  General: Alert and oriented x 3, NAD  HEENT:  PERRLA, EOMI  Neck: Supple, no JVD, no masses  CVS: S1 S2 clear  Respiratory: CTAB  Abdomen: Soft, nontender, nondistended, + bowel sounds  Ext: no cyanosis clubbing or edema  Neuro: no new deficits  Skin: right foot wound with wound VAC, left foot with ulceration and the plantar aspect  Psych: Appears tired and frustrated, alert and oriented x3    Data Review   Micro Results Recent Results (from the past 240 hour(s))  Urine culture     Status: None   Collection Time: 09/01/14  1:04 PM  Result Value Ref Range Status   Specimen Description URINE, RANDOM  Final   Special Requests NONE  Final   Colony Count   Final    >=100,000 COLONIES/ML Performed at [facility]   Final    ESCHERICHIA COLI Performed at [facility]    Report Status 09/03/2014 FINAL  Final   Organism ID, Bacteria ESCHERICHIA COLI  Final      Susceptibility    Escherichia coli - MIC*    AMPICILLIN 8 SENSITIVE Sensitive     CEFAZOLIN <=4 SENSITIVE Sensitive     CEFTRIAXONE <=1 SENSITIVE Sensitive     CIPROFLOXACIN <=0.25 SENSITIVE Sensitive     GENTAMICIN <=1 SENSITIVE Sensitive     LEVOFLOXACIN 1 SENSITIVE Sensitive     NITROFURANTOIN 64 INTERMEDIATE Intermediate     TOBRAMYCIN <=1 SENSITIVE Sensitive     TRIMETH/SULFA >=320 RESISTANT Resistant     PIP/TAZO <=4 SENSITIVE Sensitive     * ESCHERICHIA COLI  Culture, blood (x 2)     Status: None (Preliminary result)   Collection Time: 09/01/14  6:50 PM  Result Value Ref Range Status   Specimen Description BLOOD LEFT ARM  Final   Special Requests BOTTLES DRAWN AEROBIC ONLY 3CC  Final   Culture   Final           BLOOD CULTURE RECEIVED NO GROWTH TO DATE CULTURE WILL BE HELD FOR 5 DAYS BEFORE ISSUING A FINAL NEGATIVE REPORT Performed at [facility]    Report Status PENDING  Incomplete  Clostridium Difficile by PCR     Status: Abnormal   Collection Time: 09/02/14  2:07 AM  Result Value Ref Range Status   C difficile by pcr POSITIVE (A) NEGATIVE Final    Comment: CRITICAL RESULT CALLED TO, READ BACK BY AND VERIFIED WITH: B Jiles Harold RN @ [phone] ON 09/02/14 BY C DAVIS   Stool culture     Status: None (Preliminary result)   Collection Time: 09/02/14  2:07 AM  Result Value Ref Range Status   Specimen Description STOOL  Final   Special Requests NONE  Final   Culture   Final    Culture reincubated for better growth Performed at [facility]    Report Status  PENDING  Incomplete  Culture, blood (x 2)     Status: None (Preliminary result)   Collection Time: 09/02/14  5:26 AM  Result Value Ref Range Status   Specimen Description BLOOD LEFT HAND  Final   Special Requests BOTTLES DRAWN AEROBIC ONLY 1CC  Final   Culture   Final           BLOOD CULTURE RECEIVED NO GROWTH TO DATE CULTURE WILL BE HELD FOR 5 DAYS BEFORE ISSUING A FINAL NEGATIVE REPORT Performed at [facility]     Report Status PENDING  Incomplete    Radiology Reports Dg Chest Port 1 View  09/01/2014   CLINICAL DATA:  Vomiting and diarrhea, several days duration. Hypertension and diabetes.  EXAM: PORTABLE CHEST - 1 VIEW  COMPARISON:  06/07/2014  FINDINGS: Heart size is normal. Mediastinal shadows are normal. The lungs are clear. No effusions. No free air seen under the diaphragm. No bony abnormality.  IMPRESSION: Normal one-view chest   Electronically Signed   By: Nelson Chimes M.D.   On: 09/01/2014 16:35   Dg Foot Complete Left  09/02/2014   CLINICAL DATA:  Diabetic foot ulcer  EXAM: LEFT FOOT - COMPLETE 3+ VIEW  COMPARISON:  05/26/2012  FINDINGS: Three views of the left foot submitted. There is diffuse osteopenia. No acute fracture or subluxation. No bone destruction to suggest osteomyelitis.  IMPRESSION: No acute fracture or subluxation. Diffuse osteopenia. No definite evidence of osteomyelitis   Electronically Signed   By: Lahoma Crocker M.D.   On: 09/02/2014 13:16   Dg Foot Complete Right  09/02/2014   CLINICAL DATA:  Diabetic foot ulcer  EXAM: RIGHT FOOT COMPLETE - 3+ VIEW  COMPARISON:  06/02/2014  FINDINGS: Three views of the right foot submitted. There is diffuse osteopenia. There is osteolysis and cortical loss distal aspect of fourth metatarsal and base of proximal phalanx fourth toe. Findings are highly suspicious for osteomyelitis. Clinical correlation is necessary. Further correlation with MRI is recommended.  IMPRESSION: There is diffuse osteopenia. There is osteolysis and cortical loss distal aspect of fourth metatarsal and base of proximal phalanx fourth toe. Findings are highly suspicious for osteomyelitis. Clinical correlation is necessary. Further correlation with MRI is recommended.   Electronically Signed   By: Lahoma Crocker M.D.   On: 09/02/2014 13:15    CBC  Recent Labs Lab 09/01/14 1023 09/02/14 0526 09/03/14 0449 09/04/14 0456  WBC 19.9* 12.9* 9.5 5.7  HGB 12.0 9.9* 9.6* 10.0*  HCT  36.6 29.7* 28.7* 30.2*  PLT 539* 413* 448* 484*  MCV 85.1 84.4 85.2 84.4  MCH 27.9 28.1 28.5 27.9  MCHC 32.8 33.3 33.4 33.1  RDW 16.0* 16.1* 16.6* 16.9*  LYMPHSABS 0.4*  --   --   --   MONOABS 0.6  --   --   --   EOSABS 0.0  --   --   --   BASOSABS 0.0  --   --   --     Chemistries   Recent Labs Lab 09/01/14 1023 09/02/14 0526 09/03/14 0449 09/04/14 0456  NA 134* 136 137 140  K 4.0 3.7 3.4* 4.1  CL 109 114* 112* 120*  CO2 12* 13* 16* 16*  GLUCOSE 358* 160* 266* 91  BUN 48* 41* 34* 18  CREATININE 1.88* 1.65* 1.56* 1.23*  CALCIUM 8.0* 7.1* 7.7* 7.6*  AST 31 17  --   --   ALT 55* 35  --   --   ALKPHOS 161* 110  --   --  BILITOT 0.4 0.2*  --   --    ------------------------------------------------------------------------------------------------------------------ estimated creatinine clearance is 56.3 mL/min (by C-G formula based on Cr of 1.23). ------------------------------------------------------------------------------------------------------------------  Recent Labs  09/01/14 1850  HGBA1C 9.7*   ------------------------------------------------------------------------------------------------------------------ No results for input(s): CHOL, HDL, LDLCALC, TRIG, CHOLHDL, LDLDIRECT in the last 72 hours. ------------------------------------------------------------------------------------------------------------------ No results for input(s): TSH, T4TOTAL, T3FREE, THYROIDAB in the last 72 hours.  Invalid input(s): FREET3 ------------------------------------------------------------------------------------------------------------------ No results for input(s): VITAMINB12, FOLATE, FERRITIN, TIBC, IRON, RETICCTPCT in the last 72 hours.  Coagulation profile  Recent Labs Lab 09/01/14 1850  INR 1.65*    No results for input(s): DDIMER in the last 72 hours.  Cardiac Enzymes No results for input(s): CKMB, TROPONINI, MYOGLOBIN in the last 168 hours.  Invalid input(s):  CK ------------------------------------------------------------------------------------------------------------------ Invalid input(s): POCBNP   Recent Labs  09/03/14 0740 09/03/14 1208 09/03/14 1653 09/03/14 2137 09/04/14 0215 09/04/14 [place] 111* 125*     Melika Reder M.D. [facility] 09/04/2014, 10:40 AM  Pager: [phone]   Between 7am to 7pm - call Pager - [phone]  After 7pm go to [email or web address] - password [email or web address]  Call night coverage person covering after 7pm

## 2014-09-04 NOTE — Progress Notes (Signed)
ANTIBIOTIC CONSULT NOTE - INITIAL  Pharmacy Consult for Vancomycin Indication: Cellulitis  Allergies  Allergen Reactions  . Ciprofloxacin Itching  . Cleocin [Clindamycin Hcl]     Diarrhea     Patient Measurements: Height: 5\' 7"  (170.2 cm) Weight: 132 lb (59.875 kg) IBW/kg (Calculated) : 61.6 Height: 5'5" Weight: 60 kg (08/16/14)  Vital Signs: Temp: 98.4 F (36.9 C) (06/04 1300) Temp Source: Oral (06/04 1300) BP: 132/70 mmHg (06/04 1300) Pulse Rate: 74 (06/04 1300) Intake/Output from previous day: 06/03 0701 - 06/04 0700 In: 2800 [P.O.:600; I.V.:2000; IV Piggyback:200] Out: -  Intake/Output from this shift:    Labs:  Recent Labs  09/02/14 0526 09/03/14 0449 09/04/14 0456  WBC 12.9* 9.5 5.7  HGB 9.9* 9.6* 10.0*  PLT 413* 448* 484*  CREATININE 1.65* 1.56* 1.23*   CrCl ~43 ml/min/1.13m2 (normalized)  Estimated Creatinine Clearance: 56.3 mL/min (by C-G formula based on Cr of 1.23).  Recent Labs  09/04/14 1341  VANCOTROUGH 22*     Microbiology: Recent Results (from the past 720 hour(s))  Urine culture     Status: None   Collection Time: 09/01/14  1:04 PM  Result Value Ref Range Status   Specimen Description URINE, RANDOM  Final   Special Requests NONE  Final   Colony Count   Final    >=100,000 COLONIES/ML Performed at [facility]    Culture   Final    ESCHERICHIA COLI Performed at [facility]    Report Status 09/03/2014 FINAL  Final   Organism ID, Bacteria ESCHERICHIA COLI  Final      Susceptibility   Escherichia coli - MIC*    AMPICILLIN 8 SENSITIVE Sensitive     CEFAZOLIN <=4 SENSITIVE Sensitive     CEFTRIAXONE <=1 SENSITIVE Sensitive     CIPROFLOXACIN <=0.25 SENSITIVE Sensitive     GENTAMICIN <=1 SENSITIVE Sensitive     LEVOFLOXACIN 1 SENSITIVE Sensitive     NITROFURANTOIN 64 INTERMEDIATE Intermediate     TOBRAMYCIN <=1 SENSITIVE Sensitive     TRIMETH/SULFA >=320 RESISTANT Resistant     PIP/TAZO <=4 SENSITIVE Sensitive      * ESCHERICHIA COLI  Culture, blood (x 2)     Status: None (Preliminary result)   Collection Time: 09/01/14  6:50 PM  Result Value Ref Range Status   Specimen Description BLOOD LEFT ARM  Final   Special Requests BOTTLES DRAWN AEROBIC ONLY 3CC  Final   Culture   Final           BLOOD CULTURE RECEIVED NO GROWTH TO DATE CULTURE WILL BE HELD FOR 5 DAYS BEFORE ISSUING A FINAL NEGATIVE REPORT Performed at [facility]    Report Status PENDING  Incomplete  Clostridium Difficile by PCR     Status: Abnormal   Collection Time: 09/02/14  2:07 AM  Result Value Ref Range Status   C difficile by pcr POSITIVE (A) NEGATIVE Final    Comment: CRITICAL RESULT CALLED TO, READ BACK BY AND VERIFIED WITH: B BRALLEY RN @ [phone] ON 09/02/14 BY C DAVIS   Stool culture     Status: None (Preliminary result)   Collection Time: 09/02/14  2:07 AM  Result Value Ref Range Status   Specimen Description STOOL  Final   Special Requests NONE  Final   Culture   Final    Culture reincubated for better growth Performed at [facility]    Report Status PENDING  Incomplete  Culture, blood (x 2)     Status:  None (Preliminary result)   Collection Time: 09/02/14  5:26 AM  Result Value Ref Range Status   Specimen Description BLOOD LEFT HAND  Final   Special Requests BOTTLES DRAWN AEROBIC ONLY 1CC  Final   Culture   Final           BLOOD CULTURE RECEIVED NO GROWTH TO DATE CULTURE WILL BE HELD FOR 5 DAYS BEFORE ISSUING A FINAL NEGATIVE REPORT Performed at [facility]    Report Status PENDING  Incomplete    Medical History: Past Medical History  Diagnosis Date  . Diabetes mellitus   . Neuropathy in diabetes   . Hypertension   . Hypercholesteremia   . H/O seasonal allergies   . Anemia   . Left foot infection     Medications:  Scheduled:  . calcitRIOL  0.25 mcg Oral Once per day on Mon Wed Fri  . cefTRIAXone (ROCEPHIN)  IV  1 g Intravenous Q24H  . feeding supplement (RESOURCE BREEZE)   1 Container Oral BID BM  . ferrous sulfate  325 mg Oral BID WC  . folic acid  1 mg Oral Daily  . heparin  5,000 Units Subcutaneous 3 times per day  . insulin aspart  10 Units Intravenous Once  . insulin pump   Subcutaneous TID AC, HS, 0200  . ondansetron (ZOFRAN) IV  4 mg Intravenous Once  . pravastatin  20 mg Oral q1800  . pyridoxine  100 mg Oral Daily  . saccharomyces boulardii  500 mg Oral BID  . sodium chloride  3 mL Intravenous Q12H  . vancomycin  500 mg Oral Q6H  . vancomycin  1,000 mg Intravenous Q24H  . vitamin B-12  100 mcg Oral Daily   Infusions:  . sodium chloride 100 mL/hr at 09/03/14 2120   Assessment: 44 yo female with type 1 DM presents with n/v and LLE pain. Pt followed at wound care center for b/l feet ulcerations. Pharmacy is consulted to dose vancomycin for cellulitis. Ceftriaxone for E coli UTI  D3 Vancomycin 1gm q24 for cellulitis, D4 Rocephin 1gm q24 for UTI  6/4 MRI: L foot with no osteomyelitis, R foot with septic arthritis and osteomyelitis of 3rd and 4th toes (also abscess), plantar wound with myositis   Vancomycin trough today prior to 3rd dose = 22 mcg/ml, not yet at steady state, but level above desired range  Goal of Therapy:  Vancomycin trough 15-20 mcg/ml  Plan:   Reduce Vancomycin to 750mg  IV q24 hr  No change to Rocephin, not renally excreted  Aim for higher trough value with osteomyelitis  Monitor renal function  Minda Ditto PharmD Pager [phone] 09/04/2014, 2:58 PM

## 2014-09-05 LAB — BASIC METABOLIC PANEL
Anion gap: 6 (ref 5–15)
BUN: 17 mg/dL (ref 6–20)
CHLORIDE: 118 mmol/L — AB (ref 101–111)
CO2: 14 mmol/L — ABNORMAL LOW (ref 22–32)
CREATININE: 1.1 mg/dL — AB (ref 0.44–1.00)
Calcium: 7.4 mg/dL — ABNORMAL LOW (ref 8.9–10.3)
Glucose, Bld: 183 mg/dL — ABNORMAL HIGH (ref 65–99)
Potassium: 4.2 mmol/L (ref 3.5–5.1)
Sodium: 138 mmol/L (ref 135–145)

## 2014-09-05 LAB — CBC
HEMATOCRIT: 30.6 % — AB (ref 36.0–46.0)
Hemoglobin: 9.8 g/dL — ABNORMAL LOW (ref 12.0–15.0)
MCH: 27.8 pg (ref 26.0–34.0)
MCHC: 32 g/dL (ref 30.0–36.0)
MCV: 86.9 fL (ref 78.0–100.0)
PLATELETS: 524 10*3/uL — AB (ref 150–400)
RBC: 3.52 MIL/uL — ABNORMAL LOW (ref 3.87–5.11)
RDW: 17 % — ABNORMAL HIGH (ref 11.5–15.5)
WBC: 6.3 10*3/uL (ref 4.0–10.5)

## 2014-09-05 LAB — GLUCOSE, CAPILLARY
GLUCOSE-CAPILLARY: 82 mg/dL (ref 65–99)
GLUCOSE-CAPILLARY: 168 mg/dL — AB (ref 65–99)
GLUCOSE-CAPILLARY: 245 mg/dL — AB (ref 65–99)
Glucose-Capillary: 226 mg/dL — ABNORMAL HIGH (ref 65–99)
Glucose-Capillary: 239 mg/dL — ABNORMAL HIGH (ref 65–99)
Glucose-Capillary: 203 mg/dL — ABNORMAL HIGH (ref 65–99)

## 2014-09-05 NOTE — Progress Notes (Signed)
[facility]                                                                              Patient Demographics  [name], is a 44 y.o. female, DOB - 1970/07/05, [id number]  Admit date - 09/01/2014   Admitting Physician Ripudeep Krystal Eaton, MD  Outpatient Primary MD for the patient is Hayden Rasmussen., MD  LOS - 4   Chief Complaint  Patient presents with  . Emesis  . Diarrhea       Brief HPI  Patient is a 44 year old female with type 1 diabetes mellitus on insulin pump, diabetic neuropathy, bilateral foot ulcers, left foot ulcer, currently under treatment with a wound VAC Presented to ED from the wound care center. Patient reported that she was having diarrhea for last 4-5 days, She was on doxycycline for the left foot wound, finished 3 weeks ago. She denied any abdominal pain however had nausea and vomiting, multiple episodes this morning. Patient reports that the diarrhea has almost resolved since last night. She went to the wound care center on the day of admission and was also noticed to have left lower extremity erythema and pain which was new. She has a left foot ulcer as well which is being treated.  Patient also reported that for the past 1 week urine has been cloudy, increased frequency and urgency and has been drinking cranberry juice. She reported subjective fevers and chills. In ED, patient was noticed to have tachycardia with heart rate of 130, BP 199/52, WBCs 19.9, creatinine 1.8, glucose 358, UA positive for UTI     Assessment & Plan    Principal Problem:  Sepsis with metabolic acidosis, leukocytosis: Due to left lower extremity cellulitis, right foot osteomyelitis, and C. difficile colitis - Elevated pro-calcitonin, blood cultures negative so far, urine culture showed Escherichia coli - Stool for C. difficile positive, continue max dose of oral vancomycin, flaxseed place - Placed on IV vancomycin, for left lower extremity cellulitis  Active  Problems: Left lower extremity cellulitis: Improving  - On IV vancomycin - Doppler ultrasound negative for DVT  - Left foot x-ray shows diffuse osteopenia but no definite evidence of possible pneumonitis  Right foot osteomyelitis, diabetic foot ulcer Right foot x-ray shows ostial lyses and cortical loss distal aspect of fourth metatarsal and base of proximal phalanx fourth toe, suspicious for osteomyelitis. Patient had MRI in 3/16 which had not shown any osteomyelitis at that time - Orthopedics consulted Dr. Lorin Mercy, recommending ray amputation of third and fourth and fifth toe, awaiting patient's decision.  - Continue IV vancomycin  Escherichia coli UTI - Patient has received 3 days of IV antibiotics, Rocephin discontinued   Intractable diarrhea; due to C. difficile colitis - Continue oral vancomycin, appreciate ID recommendations  Acute on on CKD (chronic kidney disease), stage III: Likely due to #1  -Baseline creatinine 1.7, improving today   diabetes mellitus uncontrolled - On insulin pump, patient refuses to have sliding scale insulin or basal insulin during hospitalization, she continues to use her on insulin pump   Code Status: Full code  Family Communication: Discussed in detail with the patient, all imaging  results, lab results explained to the patient   Disposition Plan:   Time Spent in minutes  25 minutes  Procedures  None  Consults   Wound care Orthopedics Infectious disease  DVT Prophylaxis  heparin subcutaneous   Medications  Scheduled Meds: . calcitRIOL  0.25 mcg Oral Once per day on Mon Wed Fri  . feeding supplement (RESOURCE BREEZE)  1 Container Oral BID BM  . ferrous sulfate  325 mg Oral BID WC  . folic acid  1 mg Oral Daily  . heparin  5,000 Units Subcutaneous 3 times per day  . insulin aspart  10 Units Intravenous Once  . insulin pump   Subcutaneous TID AC, HS, 0200  . ondansetron (ZOFRAN) IV  4 mg Intravenous Once  . pravastatin  20 mg  Oral q1800  . pyridoxine  100 mg Oral Daily  . saccharomyces boulardii  500 mg Oral BID  . sodium chloride  3 mL Intravenous Q12H  . vancomycin  500 mg Oral Q6H  . vancomycin  750 mg Intravenous Q24H  . vitamin B-12  100 mcg Oral Daily   Continuous Infusions: . sodium chloride 100 mL/hr at 09/05/14 0620   PRN Meds:.acetaminophen **OR** acetaminophen, albuterol, alum & mag hydroxide-simeth, diphenhydrAMINE, HYDROcodone-acetaminophen, HYDROmorphone (DILAUDID) injection, ondansetron **OR** ondansetron (ZOFRAN) IV, sodium chloride   Antibiotics   Anti-infectives    Start     Dose/Rate Route Frequency Ordered Stop   09/04/14 2200  vancomycin (VANCOCIN) IVPB 750 mg/150 ml premix     750 mg 150 mL/hr over 60 Minutes Intravenous Every 24 hours 09/04/14 1458     09/04/14 1000  vancomycin (VANCOCIN) 50 mg/mL oral solution 250 mg  Status:  Discontinued     250 mg Oral 4 times daily 09/04/14 0749 09/04/14 0754   09/04/14 1000  vancomycin (VANCOCIN) 50 mg/mL oral solution 500 mg     500 mg Oral Every 6 hours 09/04/14 0754     09/03/14 1400  vancomycin (VANCOCIN) 50 mg/mL oral solution 125 mg  Status:  Discontinued     125 mg Oral 4 times daily 09/03/14 1150 09/04/14 0749   09/02/14 1600  cefTRIAXone (ROCEPHIN) 1 g in dextrose 5 % 50 mL IVPB - Premix  Status:  Discontinued     1 g 100 mL/hr over 30 Minutes Intravenous Every 24 hours 09/01/14 1708 09/04/14 1721   09/02/14 1400  vancomycin (VANCOCIN) IVPB 1000 mg/200 mL premix  Status:  Discontinued     1,000 mg 200 mL/hr over 60 Minutes Intravenous Every 24 hours 09/01/14 1254 09/04/14 1456   09/02/14 0500  metroNIDAZOLE (FLAGYL) tablet 500 mg  Status:  Discontinued     500 mg Oral 3 times per day 09/02/14 0445 09/03/14 1149   09/01/14 1430  cefTRIAXone (ROCEPHIN) 1 g in dextrose 5 % 50 mL IVPB     1 g 100 mL/hr over 30 Minutes Intravenous  Once 09/01/14 1415 09/01/14 1628   09/01/14 1215  vancomycin (VANCOCIN) 1,250 mg in sodium chloride 0.9  % 250 mL IVPB     1,250 mg 166.7 mL/hr over 90 Minutes Intravenous STAT 09/01/14 1213 09/01/14 1404        Subjective:   [name] was seen and examined today. very flat affect, wants to continue insulin pump, flexeseal placed for diarrhea Patient denies dizziness, chest pain, shortness of breath, abdominal pain, new weakness, numbess, tingling.  Objective:   Blood pressure 138/65, pulse 73, temperature 98.2 F (36.8 C), temperature source Oral, resp. rate  18, height 5\' 7"  (1.702 m), weight 59.875 kg (132 lb), last menstrual period 07/13/2014, SpO2 97 %.  Wt Readings from Last 3 Encounters:  09/01/14 59.875 kg (132 lb)  09/04/14 59.875 kg (132 lb)  08/16/14 59.875 kg (132 lb)     Intake/Output Summary (Last 24 hours) at 09/05/14 1156 Last data filed at 09/05/14 0700  Gross per 24 hour  Intake   2415 ml  Output      0 ml  Net   2415 ml    Exam  General: Alert and oriented x 3, NAD  HEENT:  PERRLA, EOMI  Neck: Supple, no JVD, no masses  CVS: S1 S2 clear  Respiratory: CTAB  Abdomen: Soft, nontender, nondistended, + bowel sounds  Ext: no cyanosis clubbing or edema  Neuro: no new deficits  Skin: right foot wound with wound VAC, left foot with ulceration and the plantar aspect  Psych: flat affect   Data Review   Micro Results Recent Results (from the past 240 hour(s))  Urine culture     Status: None   Collection Time: 09/01/14  1:04 PM  Result Value Ref Range Status   Specimen Description URINE, RANDOM  Final   Special Requests NONE  Final   Colony Count   Final    >=100,000 COLONIES/ML Performed at [facility]   Final    ESCHERICHIA COLI Performed at [facility]    Report Status 09/03/2014 FINAL  Final   Organism ID, Bacteria ESCHERICHIA COLI  Final      Susceptibility   Escherichia coli - MIC*    AMPICILLIN 8 SENSITIVE Sensitive     CEFAZOLIN <=4 SENSITIVE Sensitive     CEFTRIAXONE <=1 SENSITIVE Sensitive      CIPROFLOXACIN <=0.25 SENSITIVE Sensitive     GENTAMICIN <=1 SENSITIVE Sensitive     LEVOFLOXACIN 1 SENSITIVE Sensitive     NITROFURANTOIN 64 INTERMEDIATE Intermediate     TOBRAMYCIN <=1 SENSITIVE Sensitive     TRIMETH/SULFA >=320 RESISTANT Resistant     PIP/TAZO <=4 SENSITIVE Sensitive     * ESCHERICHIA COLI  Culture, blood (x 2)     Status: None (Preliminary result)   Collection Time: 09/01/14  6:50 PM  Result Value Ref Range Status   Specimen Description BLOOD LEFT ARM  Final   Special Requests BOTTLES DRAWN AEROBIC ONLY 3CC  Final   Culture   Final           BLOOD CULTURE RECEIVED NO GROWTH TO DATE CULTURE WILL BE HELD FOR 5 DAYS BEFORE ISSUING A FINAL NEGATIVE REPORT Performed at [facility]    Report Status PENDING  Incomplete  Clostridium Difficile by PCR     Status: Abnormal   Collection Time: 09/02/14  2:07 AM  Result Value Ref Range Status   C difficile by pcr POSITIVE (A) NEGATIVE Final    Comment: CRITICAL RESULT CALLED TO, READ BACK BY AND VERIFIED WITH: B BRALLEY RN @ [phone] ON 09/02/14 BY C DAVIS   Stool culture     Status: None (Preliminary result)   Collection Time: 09/02/14  2:07 AM  Result Value Ref Range Status   Specimen Description STOOL  Final   Special Requests NONE  Final   Culture   Final    NO SUSPICIOUS COLONIES, CONTINUING TO HOLD Performed at [facility]    Report Status PENDING  Incomplete  Culture, blood (x 2)     Status: None (Preliminary result)  Collection Time: 09/02/14  5:26 AM  Result Value Ref Range Status   Specimen Description BLOOD LEFT HAND  Final   Special Requests BOTTLES DRAWN AEROBIC ONLY 1CC  Final   Culture   Final           BLOOD CULTURE RECEIVED NO GROWTH TO DATE CULTURE WILL BE HELD FOR 5 DAYS BEFORE ISSUING A FINAL NEGATIVE REPORT Performed at [facility]    Report Status PENDING  Incomplete    Radiology Reports Mr Foot Right Wo Contrast  09/04/2014   CLINICAL DATA:  Open wound on the  plantar aspect of the foot and osteomyelitis suggested on recent plain films.  EXAM: MRI OF THE RIGHT FOREFOOT WITHOUT CONTRAST  TECHNIQUE: Multiplanar, multisequence MR imaging was performed. No intravenous contrast was administered.  COMPARISON:  Radiographs 09/02/2014  FINDINGS: As demonstrated on the radiographs the fourth metatarsal head and fourth proximal phalanx are destroyed by infection. There is significant marrow edema in and around the fourth metatarsal phalangeal joint and adjacent bones. Septic arthritis and osteoarthritis the most likely cause.  There is also septic arthritis and osteomyelitis involving the third metatarsal phalangeal joint and adjacent bones. The other metatarsal phalangeal joints and bony structures are intact.  There is a large open wound on the plantar aspect of the foot with severe cellulitis and myositis. Probable abscess surrounding the fourth metatarsal phalangeal joint.  IMPRESSION: MR findings consistent with septic arthritis and osteomyelitis involving the third and fourth metatarsal phalangeal joints as described above.  Open wound on the plantar aspect of the foot with diffuse cellulitis and myositis. Probable abscess surrounding the fourth metatarsal phalangeal joint.   Electronically Signed   By: Marijo Sanes M.D.   On: 09/04/2014 10:54   Mr Foot Left Wo Contrast  09/04/2014   CLINICAL DATA:  Diabetic foot ulcer on the plantar aspect of the foot near the base of the fifth metatarsal.  EXAM: MRI OF THE LEFT FOREFOOT WITHOUT CONTRAST  TECHNIQUE: Multiplanar, multisequence MR imaging was performed. No intravenous contrast was administered.  COMPARISON:  Radiograph 09/02/2014  FINDINGS: There is an open wound on the plantar aspect of the foot measuring a maximum of 12 mm. This is located near the base of the fourth metatarsal. I do not see a discrete soft tissue abscess. There is mild cellulitis and moderate myositis but no discrete drainable fluid collection. I do not  see any definite findings for septic arthritis or osteomyelitis.  Small focal area of marrow edema in the proximal cuboid is likely a stress reaction. I do not think this represents osteomyelitis and I do not see a definite stress fracture.  IMPRESSION: Open wound on the plantar aspect of the foot with focal cellulitis and moderate myositis but no discrete drainable soft tissue abscess.  No MR findings to suggest septic arthritis or osteomyelitis.   Electronically Signed   By: Marijo Sanes M.D.   On: 09/04/2014 11:14   Dg Chest Port 1 View  09/01/2014   CLINICAL DATA:  Vomiting and diarrhea, several days duration. Hypertension and diabetes.  EXAM: PORTABLE CHEST - 1 VIEW  COMPARISON:  06/07/2014  FINDINGS: Heart size is normal. Mediastinal shadows are normal. The lungs are clear. No effusions. No free air seen under the diaphragm. No bony abnormality.  IMPRESSION: Normal one-view chest   Electronically Signed   By: Nelson Chimes M.D.   On: 09/01/2014 16:35   Dg Foot Complete Left  09/02/2014   CLINICAL DATA:  Diabetic foot  ulcer  EXAM: LEFT FOOT - COMPLETE 3+ VIEW  COMPARISON:  05/26/2012  FINDINGS: Three views of the left foot submitted. There is diffuse osteopenia. No acute fracture or subluxation. No bone destruction to suggest osteomyelitis.  IMPRESSION: No acute fracture or subluxation. Diffuse osteopenia. No definite evidence of osteomyelitis   Electronically Signed   By: Lahoma Crocker M.D.   On: 09/02/2014 13:16   Dg Foot Complete Right  09/02/2014   CLINICAL DATA:  Diabetic foot ulcer  EXAM: RIGHT FOOT COMPLETE - 3+ VIEW  COMPARISON:  06/02/2014  FINDINGS: Three views of the right foot submitted. There is diffuse osteopenia. There is osteolysis and cortical loss distal aspect of fourth metatarsal and base of proximal phalanx fourth toe. Findings are highly suspicious for osteomyelitis. Clinical correlation is necessary. Further correlation with MRI is recommended.  IMPRESSION: There is diffuse osteopenia.  There is osteolysis and cortical loss distal aspect of fourth metatarsal and base of proximal phalanx fourth toe. Findings are highly suspicious for osteomyelitis. Clinical correlation is necessary. Further correlation with MRI is recommended.   Electronically Signed   By: Lahoma Crocker M.D.   On: 09/02/2014 13:15    CBC  Recent Labs Lab 09/01/14 1023 09/02/14 0526 09/03/14 0449 09/04/14 0456 09/05/14 0505  WBC 19.9* 12.9* 9.5 5.7 6.3  HGB 12.0 9.9* 9.6* 10.0* 9.8*  HCT 36.6 29.7* 28.7* 30.2* 30.6*  PLT 539* 413* 448* 484* 524*  MCV 85.1 84.4 85.2 84.4 86.9  MCH 27.9 28.1 28.5 27.9 27.8  MCHC 32.8 33.3 33.4 33.1 32.0  RDW 16.0* 16.1* 16.6* 16.9* 17.0*  LYMPHSABS 0.4*  --   --   --   --   MONOABS 0.6  --   --   --   --   EOSABS 0.0  --   --   --   --   BASOSABS 0.0  --   --   --   --     Chemistries   Recent Labs Lab 09/01/14 1023 09/02/14 0526 09/03/14 0449 09/04/14 0456 09/05/14 0505  NA 134* 136 137 140 138  K 4.0 3.7 3.4* 4.1 4.2  CL 109 114* 112* 120* 118*  CO2 12* 13* 16* 16* 14*  GLUCOSE 358* 160* 266* 91 183*  BUN 48* 41* 34* 18 17  CREATININE 1.88* 1.65* 1.56* 1.23* 1.10*  CALCIUM 8.0* 7.1* 7.7* 7.6* 7.4*  AST 31 17  --   --   --   ALT 55* 35  --   --   --   ALKPHOS 161* 110  --   --   --   BILITOT 0.4 0.2*  --   --   --    ------------------------------------------------------------------------------------------------------------------ estimated creatinine clearance is 63 mL/min (by C-G formula based on Cr of 1.1). ------------------------------------------------------------------------------------------------------------------ No results for input(s): HGBA1C in the last 72 hours. ------------------------------------------------------------------------------------------------------------------ No results for input(s): CHOL, HDL, LDLCALC, TRIG, CHOLHDL, LDLDIRECT in the last 72  hours. ------------------------------------------------------------------------------------------------------------------ No results for input(s): TSH, T4TOTAL, T3FREE, THYROIDAB in the last 72 hours.  Invalid input(s): FREET3 ------------------------------------------------------------------------------------------------------------------ No results for input(s): VITAMINB12, FOLATE, FERRITIN, TIBC, IRON, RETICCTPCT in the last 72 hours.  Coagulation profile  Recent Labs Lab 09/01/14 1850  INR 1.65*    No results for input(s): DDIMER in the last 72 hours.  Cardiac Enzymes No results for input(s): CKMB, TROPONINI, MYOGLOBIN in the last 168 hours.  Invalid input(s): CK ------------------------------------------------------------------------------------------------------------------ Invalid input(s): [place]  09/04/14 0737 09/04/14 1158 09/04/14 1657 09/04/14 2144  09/05/14 0211 09/05/14 0744  GLUCAP 125* 182* 350* 251* 168* 56*     RAI,RIPUDEEP M.D. [facility] 09/05/2014, 11:56 AM  Pager: [phone]   Between 7am to 7pm - call Pager - [phone]  After 7pm go to [email or web address] - password [email or web address]  Call night coverage person covering after 7pm

## 2014-09-05 NOTE — Progress Notes (Signed)
Patient ID: [name], female   DOB: 11-Apr-1970, 44 y.o.   MRN: [id number] Discussed with pt bone infection on right foot and ray amputation of 3rd 4th and 5th. She will think about this . Reviewed plain xrays , MRI and reports of both and showed them to her.

## 2014-09-05 NOTE — Progress Notes (Signed)
Patient refusing heparin sub Q. Patient advised of the risk of not having VTE treatment while in hospital. Patient verbalized understanding. Will continue to monitor closely

## 2014-09-06 ENCOUNTER — Telehealth: Payer: Self-pay | Admitting: Endocrinology

## 2014-09-06 DIAGNOSIS — M869 Osteomyelitis, unspecified: Secondary | ICD-10-CM

## 2014-09-06 LAB — GLUCOSE, CAPILLARY
GLUCOSE-CAPILLARY: 204 mg/dL — AB (ref 65–99)
Glucose-Capillary: 205 mg/dL — ABNORMAL HIGH (ref 65–99)
Glucose-Capillary: 209 mg/dL — ABNORMAL HIGH (ref 65–99)

## 2014-09-06 LAB — STOOL CULTURE

## 2014-09-06 MED ORDER — VANCOMYCIN HCL IN DEXTROSE 750-5 MG/150ML-% IV SOLN: 750.0000 mg | INTRAVENOUS | Status: DC

## 2014-09-06 MED ORDER — SACCHAROMYCES BOULARDII 250 MG PO CAPS: 500.0000 mg | ORAL_CAPSULE | Freq: Two times a day (BID) | ORAL | Status: DC

## 2014-09-06 MED ORDER — HEPARIN SODIUM (PORCINE) 5000 UNIT/ML IJ SOLN: 5000.0000 [IU] | Freq: Three times a day (TID) | INTRAMUSCULAR | Status: DC

## 2014-09-06 MED ORDER — VANCOMYCIN 50 MG/ML ORAL SOLUTION: 500.0000 mg | Freq: Four times a day (QID) | ORAL | Status: DC

## 2014-09-06 MED ORDER — BOOST / RESOURCE BREEZE PO LIQD: 1.0000 | Freq: Two times a day (BID) | ORAL | Status: DC

## 2014-09-06 MED ORDER — SALINE SPRAY 0.65 % NA SOLN: 1.0000 | NASAL | Status: DC | PRN

## 2014-09-06 MED ORDER — DIPHENHYDRAMINE HCL 25 MG PO CAPS: 25.0000 mg | ORAL_CAPSULE | Freq: Four times a day (QID) | ORAL | Status: DC | PRN

## 2014-09-06 NOTE — Telephone Encounter (Signed)
Patient stated that she need a referral to Lims preservation at [place], she in the hospital, she doesn't know the phone number.

## 2014-09-06 NOTE — Telephone Encounter (Signed)
Dr. Sarajane Jews just wanted you to be aware, and have you discuss with the patient about the surgery.

## 2014-09-06 NOTE — Telephone Encounter (Signed)
Team health note dated 09/05/14 at 11:47 AM: Caller states she is at [facility] and she is trying to get a 2nd opinion at [facility] regarding her symptoms. The Dr. there wants to remove her toes on Tuesday. She has a wound vac on her foot and the Dr. said there is an infection in the bone.  Dr. Sarajane Jews advised that he will pass along the message to the Endocrinologist MD on call and have them touch base with her tomorrow about this decision and second opinion. Caller informed and voiced understanding. Caller informed and voiced understanding.

## 2014-09-06 NOTE — Progress Notes (Signed)
Report given to Hettie Holstein, RN at [facility]. Pt will be transferred to room [facility] via [place].

## 2014-09-06 NOTE — Telephone Encounter (Signed)
She can proceed with surgery since the bone has infection

## 2014-09-06 NOTE — Telephone Encounter (Signed)
Not clear what the patient is wanting from me?

## 2014-09-06 NOTE — Telephone Encounter (Signed)
Please see below.

## 2014-09-06 NOTE — Progress Notes (Signed)
Patient ID: [name], female   DOB: [birth year], 44 y.o.   MRN: [id number]   Patient seen and examined by me today.  States that she is scheduled for transfer today to [facility] for a second opinion regarding treatment for her right foot.  States that they have a special limb preservation facility.  Again reviewed MRI results with her.  Understands the risks involved with not having surgical intervention.  Voices understanding.  Dr Lorin Mercy advised.

## 2014-09-06 NOTE — Consult Note (Signed)
WOC wound consult note Reason for Consult: NPWT (VAC) dressing change, right plantar foot.   Patient being discharged to another facility today.  NS moist dressing will be applied in anticipation of that discharge.  Wound type: Osteomyelitis with recommended amputation right 3rd-5th metatarsal.  Discussed with Dr Lorin Mercy in detail and would like to be transferred to [facility].  Has been informed of risks of delaying treatment and wishes to proceed with transfer.  NPWT (VAC) will be removed and NS moist dressing added.    Pressure Ulcer POA: Yes  Measurement: 8 cm x 3.5 cm x 0.5 cm with undermining present from 10 to 12 o'clock, extending 1 cm.  Wound bed: Pink, friable and malodorous Drainage (amount, consistency, odor) Minimal serosanguinous.  Foul necrotic odor.  Periwound: Erythema present Dressing procedure/placement/frequency: Cleanse wound with NS and pat gently dry.  Gently fill wound bed with NS moist gauze.  Cover with 4x4 gauze and kerlix/tape.   Will not follow at this time.  Please re-consult if needed.  Domenic Moras RN BSN [place] Pager [phone]

## 2014-09-06 NOTE — Telephone Encounter (Signed)
See note below

## 2014-09-06 NOTE — Discharge Summary (Addendum)
TRANSFER SUMMARY   [name] [id number]  is a 44 y.o. female  Outpatient Primary MD for the patient is Hayden Rasmussen., MD Admission date: 09/01/2014 Transfer Date 09/06/2014 Admitting Physician Ripudeep Krystal Eaton, MD   Place of Transfer:  [facility] Accepting MD : Dr  Konrad Dolores  Mode  [facility]   Condition  Stable   Admission Diagnosis  UTI (lower urinary tract infection) [N39.0] Cellulitis of left leg [L03.116] Sepsis [A41.9] Sepsis, due to unspecified organism [A41.9] Non-intractable vomiting with nausea, vomiting of unspecified type [R11.2]  Discharge Diagnosis     Right foot osteomyelitis   left lower extremity cellulitis with foot ulcer C. difficile colitis with intractable diarrhea Escherichia coli UTI Sepsis with metabolic acidosis, leukocytosis Acute on chronic kidney disease, stage III Diabetes mellitus uncontrolled, on insulin pump    Past Medical History  Diagnosis Date  . Diabetes mellitus   . Neuropathy in diabetes   . Hypertension   . Hypercholesteremia   . H/O seasonal allergies   . Anemia   . Left foot infection     Past Surgical History  Procedure Laterality Date  . Cesarean section    . Eye surgery    . Hemrrhoidectomy    . Peripherally inserted central catheter insertion    . I&d extremity Right 06/10/2014    Procedure: IRRIGATION AND DEBRIDEMENT Right Foot;  Surgeon: Newt Minion, MD;  Location: [place];  Service: Orthopedics;  Laterality: Right;  . Skin split graft Right 06/15/2014    Procedure: SPLIT THICKNESS SKIN GRAFT RIGHT FOOT;  Surgeon: Newt Minion, MD;  Location: [place];  Service: Orthopedics;  Laterality: Right;    Consults  orthopedics, Dr. Lorin Mercy                  Infectious disease, Dr. Saverio Danker Course   History of present illness patient was admitted on 09/01/14 Patient is a 44 year old female with type 1 diabetes mellitus on insulin pump, diabetic neuropathy, bilateral foot ulcers,  left foot ulcer, currently under treatment with a wound VAC Presented to ED from the wound care center. Patient reported that she was having diarrhea for last 4-5 days, She was on doxycycline for the left foot wound, finished 3 weeks ago. She denied any abdominal pain however had nausea and vomiting, multiple episodes this morning. Patient reports that the diarrhea has almost resolved since last night. She went to the wound care center on the day of admission and was also noticed to have left lower extremity erythema and pain which was new. She has a left foot ulcer as well which is being treated.  Patient also reported that for the past 1 week urine has been cloudy, increased frequency and urgency and has been drinking cranberry juice. She reported subjective fevers and chills. In ED, patient was noticed to have tachycardia with heart rate of 130, BP 199/52, WBCs 19.9, creatinine 1.8, glucose 358, UA positive for UTI    Hospital course  Sepsis with metabolic acidosis, leukocytosis: Due to left lower extremity cellulitis, right foot osteomyelitis, and C. difficile colitis - Elevated pro-calcitonin, blood cultures negative so far, urine culture showed Escherichia coli - Stool for C. difficile positive, continue max dose of oral vancomycin, flaxseal place - on IV vancomycin, for left lower extremity cellulitis   Left lower extremity cellulitis: Improving  - On IV vancomycin - Doppler ultrasound negative for DVT  - Left foot x-ray shows diffuse osteopenia but no definite evidence of possible  pneumonitis MRI of the left foot did not show any osteomyelitis.  Right foot osteomyelitis, diabetic foot ulcer Right foot x-ray shows osteomyelitis and cortical loss distal aspect of fourth metatarsal and base of proximal phalanx fourth toe, suspicious for osteomyelitis. Patient had MRI in 3/16 which had not shown any osteomyelitis at that time MRI of the right foot showed septic arthritis and also  myelitis involving the third and fourth metatarsal phalangeal joints, open wound on the plantar aspect of the foot with diffuse cellulitis and myositis. Probable abscess surrounding the fourth metatarsophalangeal joint. Orthopedics was consulted and patient was seen by Dr. Lorin Mercy who recommended ray amputation of third, fourth and fifth toe. Patient however requested for second opinion and transfer to [facility]. I contacted [facility] and discussed in detail with Dr. Konrad Dolores who accepted the patient for transfer. Continue IV vancomycin.  Escherichia coli UTI - Patient has received 3 days of IV antibiotics, Rocephin discontinued (completed treatment) per infectious disease recommendations.   Intractable diarrhea; due to C. difficile colitis - Continue oral vancomycin 500 mgQID, patient is being followed by infectious disease, Dr. Karolee Ohs. Ideally stop other offending and TB Rx and focus on C. difficile however in her scenario, osteomyelitis also needs treatment. Infectious disease recommended that if no response to high-dose vancomycin over the next few days, can try Dificid  Acute on on CKD (chronic kidney disease), stage III: Likely due to #1. Patient presented with a creatinine of 1.8, baseline 1.7. Patient was placed on IV fluid hydration and antiemetics, creatinine has improved to 1.1   diabetes mellitus uncontrolled -  patient is on insulin pump, patient refuses to have sliding scale insulin or basal insulin during hospitalization, she continues to use her on insulin pump  Today   Subjective:   Audrea Fantauzzi today has no headache,no chest abdominal pain,no new weakness tingling or numbness. She is disappointed to hear from orthopedics that she will need toe amputation. Patient requested transfer to [place] to have diarrhea.   Objective:   Blood pressure 139/67, pulse 76, temperature 98.3 F (36.8 C), temperature source Oral, resp. rate 18, height  5\' 7"  (1.702 m), weight 59.875 kg (132 lb), last menstrual period 07/13/2014, SpO2 99 %.  Intake/Output Summary (Last 24 hours) at 09/06/14 1034 Last data filed at 09/06/14 0848  Gross per 24 hour  Intake   3390 ml  Output    504 ml  Net   2886 ml    Exam Awake Alert, Oriented x3, No new F.N deficits, Normal affect HEENT: EOMI, PERLA Neck: Supple,no JVD, No cervical lymphadenopathy appreciated.  Chest: clear to ausculatation, no wheezing, rales and rhonchi CVS: RRR,No murmurs, rubs or gallops Abdomen: Normal bowel sounds, Soft, Non tender, No organomegaly, No rebound or guarding Extremeties: No Cyanosis, Clubbing or edema. Left lower extremity cellulitis has improved significantly, left plantar ulcer, has wound VAC on the right foot  GU: flexiseal +  Data Review  CBC w Diff: Lab Results  Component Value Date   WBC 6.3 09/05/2014   HGB 9.8* 09/05/2014   HCT 30.6* 09/05/2014   PLT 524* 09/05/2014   LYMPHOPCT 2* 09/01/2014   BANDSPCT 8 03/25/2013   MONOPCT 3 09/01/2014   EOSPCT 0 09/01/2014   BASOPCT 0 09/01/2014   CMP: Lab Results  Component Value Date   NA 138 09/05/2014   K 4.2 09/05/2014   CL 118* 09/05/2014   CO2 14* 09/05/2014   BUN 17 09/05/2014   CREATININE 1.10* 09/05/2014  CREATININE 1.11* 05/24/2011   PROT 4.9* 09/02/2014   ALBUMIN 1.9* 09/02/2014   BILITOT 0.2* 09/02/2014   ALKPHOS 110 09/02/2014   AST 17 09/02/2014   ALT 35 09/02/2014  .  Significant Tests   Significant Diagnostic Studies:  Dg Chest Port 1 View  09/01/2014   CLINICAL DATA:  Vomiting and diarrhea, several days duration. Hypertension and diabetes.  EXAM: PORTABLE CHEST - 1 VIEW  COMPARISON:  06/07/2014  FINDINGS: Heart size is normal. Mediastinal shadows are normal. The lungs are clear. No effusions. No free air seen under the diaphragm. No bony abnormality.  IMPRESSION: Normal one-view chest   Electronically Signed   By: Nelson Chimes M.D.   On: 09/01/2014 16:35   Dg Foot Complete  Left  09/02/2014   CLINICAL DATA:  Diabetic foot ulcer  EXAM: LEFT FOOT - COMPLETE 3+ VIEW  COMPARISON:  05/26/2012  FINDINGS: Three views of the left foot submitted. There is diffuse osteopenia. No acute fracture or subluxation. No bone destruction to suggest osteomyelitis.  IMPRESSION: No acute fracture or subluxation. Diffuse osteopenia. No definite evidence of osteomyelitis   Electronically Signed   By: Lahoma Crocker M.D.   On: 09/02/2014 13:16   Dg Foot Complete Right  09/02/2014   CLINICAL DATA:  Diabetic foot ulcer  EXAM: RIGHT FOOT COMPLETE - 3+ VIEW  COMPARISON:  06/02/2014  FINDINGS: Three views of the right foot submitted. There is diffuse osteopenia. There is osteolysis and cortical loss distal aspect of fourth metatarsal and base of proximal phalanx fourth toe. Findings are highly suspicious for osteomyelitis. Clinical correlation is necessary. Further correlation with MRI is recommended.  IMPRESSION: There is diffuse osteopenia. There is osteolysis and cortical loss distal aspect of fourth metatarsal and base of proximal phalanx fourth toe. Findings are highly suspicious for osteomyelitis. Clinical correlation is necessary. Further correlation with MRI is recommended.   Electronically Signed   By: Lahoma Crocker M.D.   On: 09/02/2014 13:15   MRI of the right foot 6/4 FINDINGS: As demonstrated on the radiographs the fourth metatarsal head and fourth proximal phalanx are destroyed by infection. There is significant marrow edema in and around the fourth metatarsal phalangeal joint and adjacent bones. Septic arthritis and osteoarthritis the most likely cause.  There is also septic arthritis and osteomyelitis involving the third metatarsal phalangeal joint and adjacent bones. The other metatarsal phalangeal joints and bony structures are intact.  There is a large open wound on the plantar aspect of the foot with severe cellulitis and myositis. Probable abscess surrounding the fourth metatarsal  phalangeal joint.  IMPRESSION: MR findings consistent with septic arthritis and osteomyelitis involving the third and fourth metatarsal phalangeal joints as described above.  Open wound on the plantar aspect of the foot with diffuse cellulitis and myositis. Probable abscess surrounding the fourth metatarsal phalangeal joint.  MRI of the left foot 6/4 FINDINGS: There is an open wound on the plantar aspect of the foot measuring a maximum of 12 mm. This is located near the base of the fourth metatarsal. I do not see a discrete soft tissue abscess. There is mild cellulitis and moderate myositis but no discrete drainable fluid collection. I do not see any definite findings for septic arthritis or osteomyelitis.  Small focal area of marrow edema in the proximal cuboid is likely a stress reaction. I do not think this represents osteomyelitis and I do not see a definite stress fracture.  IMPRESSION: Open wound on the plantar aspect of the foot with  focal cellulitis and moderate myositis but no discrete drainable soft tissue abscess.  No MR findings to suggest septic arthritis or osteomyelitis.   Scheduled Meds: . calcitRIOL  0.25 mcg Oral Once per day on Mon Wed Fri  . feeding supplement (RESOURCE BREEZE)  1 Container Oral BID BM  . ferrous sulfate  325 mg Oral BID WC  . folic acid  1 mg Oral Daily  . heparin  5,000 Units Subcutaneous 3 times per day  . insulin aspart  10 Units Intravenous Once  . insulin pump   Subcutaneous TID AC, HS, 0200  . ondansetron (ZOFRAN) IV  4 mg Intravenous Once  . pravastatin  20 mg Oral q1800  . pyridoxine  100 mg Oral Daily  . saccharomyces boulardii  500 mg Oral BID  . sodium chloride  3 mL Intravenous Q12H  . vancomycin  500 mg Oral Q6H  . vancomycin  750 mg Intravenous Q24H  . vitamin B-12  100 mcg Oral Daily   Continuous Infusions: . sodium chloride 100 mL/hr at 09/05/14 0620       The risks and  Benefits of transporting  including disability, death and discomfort  were discussed with the patient or health power of attorney and were agreeable to the plan and further management.  Total Time in preparing paper work, todays exam and data evaluation: 35 minutes  Signed: RAI,RIPUDEEP M.D. [facility] 09/06/2014, 10:34 AM    Code query: Patient also had moderate to severe malnutrition, was placed on nutritional supplements.     RAI,RIPUDEEP M.D. [facility] 09/16/2014, 1:10 PM  Pager: [phone]

## 2014-09-06 NOTE — Telephone Encounter (Signed)
Noted  

## 2014-09-07 ENCOUNTER — Encounter (HOSPITAL_COMMUNITY)
Admission: RE | Admit: 2014-09-07 | Payer: Medicaid Other | Source: Ambulatory Visit | Attending: Nephrology | Admitting: Nephrology

## 2014-09-07 NOTE — Telephone Encounter (Signed)
Don't know this, needs to ask PCP

## 2014-09-07 NOTE — Progress Notes (Signed)
Patient no longer to receive Procrit injections at [facility]. Dr. Jason Nest office called to cancel orders and all future appointments for Procrit. MD office stated they called patient and informed her of above.

## 2014-09-08 ENCOUNTER — Encounter (HOSPITAL_COMMUNITY): Payer: Medicaid Other

## 2014-09-08 LAB — CULTURE, BLOOD (ROUTINE X 2)
CULTURE: NO GROWTH
Culture: NO GROWTH

## 2014-09-11 HISTORY — PX: TRANSMETATARSAL AMPUTATION: SHX6197

## 2014-09-14 ENCOUNTER — Encounter (HOSPITAL_COMMUNITY): Payer: Medicaid Other

## 2014-09-15 ENCOUNTER — Encounter (HOSPITAL_COMMUNITY): Payer: Medicaid Other

## 2014-09-20 ENCOUNTER — Ambulatory Visit: Payer: Medicaid Other | Admitting: Endocrinology

## 2014-09-21 ENCOUNTER — Encounter (HOSPITAL_COMMUNITY): Payer: Medicaid Other

## 2014-09-22 ENCOUNTER — Encounter: Payer: Self-pay | Admitting: Internal Medicine

## 2014-09-22 ENCOUNTER — Non-Acute Institutional Stay (SKILLED_NURSING_FACILITY): Payer: Medicare Other | Admitting: Internal Medicine

## 2014-09-22 ENCOUNTER — Encounter (HOSPITAL_COMMUNITY): Payer: Medicaid Other

## 2014-09-22 DIAGNOSIS — N183 Chronic kidney disease, stage 3 unspecified: Secondary | ICD-10-CM

## 2014-09-22 DIAGNOSIS — L97529 Non-pressure chronic ulcer of other part of left foot with unspecified severity: Secondary | ICD-10-CM

## 2014-09-22 DIAGNOSIS — E1041 Type 1 diabetes mellitus with diabetic mononeuropathy: Secondary | ICD-10-CM

## 2014-09-22 DIAGNOSIS — E10621 Type 1 diabetes mellitus with foot ulcer: Secondary | ICD-10-CM

## 2014-09-22 DIAGNOSIS — Z89431 Acquired absence of right foot: Secondary | ICD-10-CM | POA: Diagnosis not present

## 2014-09-22 DIAGNOSIS — L97519 Non-pressure chronic ulcer of other part of right foot with unspecified severity: Secondary | ICD-10-CM

## 2014-09-22 DIAGNOSIS — D509 Iron deficiency anemia, unspecified: Secondary | ICD-10-CM

## 2014-09-22 DIAGNOSIS — A047 Enterocolitis due to Clostridium difficile: Secondary | ICD-10-CM

## 2014-09-22 DIAGNOSIS — I10 Essential (primary) hypertension: Secondary | ICD-10-CM | POA: Diagnosis not present

## 2014-09-22 DIAGNOSIS — E1049 Type 1 diabetes mellitus with other diabetic neurological complication: Secondary | ICD-10-CM

## 2014-09-22 DIAGNOSIS — M869 Osteomyelitis, unspecified: Secondary | ICD-10-CM | POA: Diagnosis not present

## 2014-09-22 DIAGNOSIS — A0472 Enterocolitis due to Clostridium difficile, not specified as recurrent: Secondary | ICD-10-CM

## 2014-09-22 DIAGNOSIS — IMO0002 Reserved for concepts with insufficient information to code with codable children: Secondary | ICD-10-CM

## 2014-09-22 DIAGNOSIS — E1065 Type 1 diabetes mellitus with hyperglycemia: Secondary | ICD-10-CM

## 2014-09-22 NOTE — Progress Notes (Signed)
MRN: [id number] Name: [name]  Sex: female Age: 44 y.o. DOB: [birth year]  [place] #: [name] Facility/Room:513 Level Of Care: SNF Provider: Inocencio Homes D Emergency Contacts: Extended Emergency Contact Information Primary Emergency Contact: Sammie Bench, [place] Phone: [phone] Relation: Sister  Code Status: FULL  Allergies: Ciprofloxacin and Cleocin  Chief Complaint  Patient presents with  . New Admit To SNF    HPI: Patient is 43 y.o. female who is admitted to SNF for OT/PT after being hospitalized for transmetatarsal amputation 2/2 DM type 1.  Past Medical History  Diagnosis Date  . Diabetes mellitus   . Neuropathy in diabetes   . Hypertension   . Hypercholesteremia   . H/O seasonal allergies   . Anemia   . Left foot infection     Past Surgical History  Procedure Laterality Date  . Cesarean section    . Eye surgery    . Hemrrhoidectomy    . Peripherally inserted central catheter insertion    . I&d extremity Right 06/10/2014    Procedure: IRRIGATION AND DEBRIDEMENT Right Foot;  Surgeon: Newt Minion, MD;  Location: [place];  Service: Orthopedics;  Laterality: Right;  . Skin split graft Right 06/15/2014    Procedure: SPLIT THICKNESS SKIN GRAFT RIGHT FOOT;  Surgeon: Newt Minion, MD;  Location: [place];  Service: Orthopedics;  Laterality: Right;      Medication List       This list is accurate as of: 09/22/14 11:59 PM.  Always use your most recent med list.               albuterol 108 (90 BASE) MCG/ACT inhaler  Commonly known as:  PROVENTIL HFA;VENTOLIN HFA  Inhale 2 puffs into the lungs every 6 (six) hours as needed. S.O.B.     calcitRIOL 0.25 MCG capsule  Commonly known as:  ROCALTROL  Take 0.25 mcg by mouth. M,W,F     CALCIUM PO  Take 1 tablet by mouth daily.     diphenhydrAMINE 25 mg capsule  Commonly known as:  BENADRYL  Take 1 capsule (25 mg total) by mouth every 6 (six) hours as  needed for itching.     feeding supplement (RESOURCE BREEZE) Liqd  Take 1 Container by mouth 2 (two) times daily between meals.     ferrous sulfate 325 (65 FE) MG tablet  Take 325 mg by mouth 2 (two) times daily with a meal.     folic acid 1 MG tablet  Commonly known as:  FOLVITE  Take 1 mg by mouth daily.     heparin 5000 UNIT/ML injection  Inject 1 mL (5,000 Units total) into the skin every 8 (eight) hours.     insulin aspart 100 UNIT/ML injection  Commonly known as:  novoLOG  Use max 65 units per day with pump     pravastatin 40 MG tablet  Commonly known as:  PRAVACHOL  TAKE 1/2 TABLETS (20 MG TOTAL) BY MOUTH DAILY.     pyridoxine 100 MG tablet  Commonly known as:  B-6  Take 100 mg by mouth daily.     saccharomyces boulardii 250 MG capsule  Commonly known as:  FLORASTOR  Take 2 capsules (500 mg total) by mouth 2 (two) times daily.     sodium chloride 0.65 % Soln nasal spray  Commonly known as:  OCEAN  Place 1 spray into both nostrils as needed for congestion.  vancomycin 50 mg/mL oral solution  Commonly known as:  VANCOCIN  Take 10 mLs (500 mg total) by mouth every 6 (six) hours.     Vancomycin 750 MG/150ML Soln  Commonly known as:  VANCOCIN  Inject 150 mLs (750 mg total) into the vein daily.     vitamin B-12 100 MCG tablet  Commonly known as:  CYANOCOBALAMIN  Take 100 mcg by mouth daily.        No orders of the defined types were placed in this encounter.    Immunization History  Administered Date(s) Administered  . Influenza Split 02/15/2011  . Pneumococcal Conjugate-13 02/15/2011  . Tdap 08/11/2012    History  Substance Use Topics  . Smoking status: Former Smoker    Quit date: 05/16/2008  . Smokeless tobacco: Never Used  . Alcohol Use: 0.5 oz/week    1 drink(s) per week     Comment: Occasional    Family history is noncontributory    Review of Systems  DATA OBTAINED: from patient, nurse; PT states some non compliance with how she is  using her walker GENERAL:  no fevers, fatigue, appetite changes; pt c/o multiple things but nothing new SKIN: No itching, rash or wounds EYES: No eye pain, redness, discharge EARS: No earache, tinnitus, change in hearing NOSE: No congestion, drainage or bleeding  MOUTH/THROAT: No mouth or tooth pain, No sore throat RESPIRATORY: No cough, wheezing, SOB CARDIAC: No chest pain, palpitations, lower extremity edema  GI: No abdominal pain, No N/V/D or constipation, No heartburn or reflux  GU: No dysuria, frequency or urgency, or incontinence  MUSCULOSKELETAL: No unrelieved bone/joint pain NEUROLOGIC: No headache, dizziness or focal weakness PSYCHIATRIC: No overt anxiety or sadness, No behavior issue.   Filed Vitals:   09/22/14 1627  BP: 141/73  Pulse: 77  Temp: 98.3 F (36.8 C)  Resp: 16    Physical Exam  GENERAL APPEARANCE: Alert, conversant,  No acute distress.  SKIN: No diaphoresis rash HEAD: Normocephalic, atraumatic  EYES: Conjunctiva/lids clear. Pupils round, reactive. EOMs intact.  EARS: External exam WNL, canals clear. Hearing grossly normal.  NOSE: No deformity or discharge.  MOUTH/THROAT: Lips w/o lesions  RESPIRATORY: Breathing is even, unlabored. Lung sounds are clear   CARDIOVASCULAR: Heart RRR no murmurs, rubs or gallops. No peripheral edema.   GASTROINTESTINAL: Abdomen is soft, non-tender, not distended w/ normal bowel sounds. GENITOURINARY: Bladder non tender, not distended  MUSCULOSKELETAL: cast on R foot, post op shoe L foot NEUROLOGIC:  Cranial nerves 2-12 grossly intact. Moves all extremities  PSYCHIATRIC: slt odd affect, no behavioral issues  Patient Active Problem List   Diagnosis Date Noted  . Status post transmetatarsal amputation of right foot 09/26/2014  . Diabetic ulcer of left foot associated with type 1 diabetes mellitus 09/26/2014  . C. difficile diarrhea 09/26/2014  . Sepsis 09/01/2014  . Cellulitis 09/01/2014  . Nausea and vomiting 09/01/2014   . Diarrhea 09/01/2014  . AKI (acute kidney injury) 06/13/2014  . Cellulitis of right foot 06/02/2014  . Diabetic ulcer of right foot 06/02/2014  . DM type 1 causing renal disease 06/13/2013  . Osteomyelitis 06/12/2013  . CKD (chronic kidney disease), stage III 06/12/2013  . UTI (urinary tract infection) 03/25/2013  . CAP (community acquired pneumonia) 03/25/2013  . Acute blood loss anemia 03/25/2013  . Acute renal failure 03/25/2013  . Acidosis 03/25/2013  . Leukocytosis, unspecified 03/25/2013  . DVT prophylaxis 03/25/2013  . Iron deficiency anemia 05/09/2011  . Charcot foot due to diabetes mellitus 05/09/2011  .  Foot ulcer due to secondary DM 05/09/2011  . Type 1 diabetes mellitus with neurological manifestations, uncontrolled   . Hypertension   . Hypercholesteremia     CBC    Component Value Date/Time   WBC 6.3 09/05/2014 0505   RBC 3.52* 09/05/2014 0505   RBC 3.36* 06/13/2013 0450   HGB 9.8* 09/05/2014 0505   HCT 30.6* 09/05/2014 0505   PLT 524* 09/05/2014 0505   MCV 86.9 09/05/2014 0505   LYMPHSABS 0.4* 09/01/2014 1023   MONOABS 0.6 09/01/2014 1023   EOSABS 0.0 09/01/2014 1023   BASOSABS 0.0 09/01/2014 1023    CMP     Component Value Date/Time   NA 138 09/05/2014 0505   K 4.2 09/05/2014 0505   CL 118* 09/05/2014 0505   CO2 14* 09/05/2014 0505   GLUCOSE 183* 09/05/2014 0505   BUN 17 09/05/2014 0505   CREATININE 1.10* 09/05/2014 0505   CREATININE 1.11* 05/24/2011 1044   CALCIUM 7.4* 09/05/2014 0505   CALCIUM 8.5 09/01/2009 2241   PROT 4.9* 09/02/2014 0526   ALBUMIN 1.9* 09/02/2014 0526   AST 17 09/02/2014 0526   ALT 35 09/02/2014 0526   ALKPHOS 110 09/02/2014 0526   BILITOT 0.2* 09/02/2014 0526   GFRNONAA >60 09/05/2014 0505   GFRAA >60 09/05/2014 0505    Assessment and Plan  Diabetic ulcer of right foot Resulting in osteomylitis of toes 34 and 4, s/o I and D for foot abscess, s/p wound vac, leading to surgery.  Osteomyelitis 2/2 foot wound with  abscess, wound vac and surgery by [facility] orthopedists who were the second opinion for the [facility] physicians  Status post transmetatarsal amputation of right foot 2/2 to diabetic foot wound, ulcer, abscess and inevitably osteomylitis leading to surgery. Pt has R foot casted.Pt is on 2 weeks antibiotics, was d/c on keflex.  Diabetic ulcer of left foot associated with type 1 diabetes mellitus MRI of foot showed plantar ulcer, with DJD but no osteo;Plan - off loading sow, 2 weeks abx, PT, WC  C. difficile diarrhea Tx with vancomycin for a week without much improvement, put on dificid and continued vancomycin with resolution  CKD (chronic kidney disease), stage III Felt to be near baseline. GFR -49  Type 1 diabetes mellitus with neurological manifestations, uncontrolled Insulin pump, pt manages, not well controlled;has had education; A1c 9.7  Iron deficiency anemia Most recent Hb 9; treated with iron  Hypertension Started on Lisinopril 5 mg without worsening of renal disease or hyperkalemia    Hennie Duos, MD

## 2014-09-25 IMAGING — CR DG FOOT COMPLETE 3+V*L*
3 series · 3 of 3 positions shown · non-contrast
Comparison: 11/17/2012

CLINICAL DATA: Osteomyelitis

EXAM:
LEFT FOOT - COMPLETE 3+ VIEW

[t foot ap left]
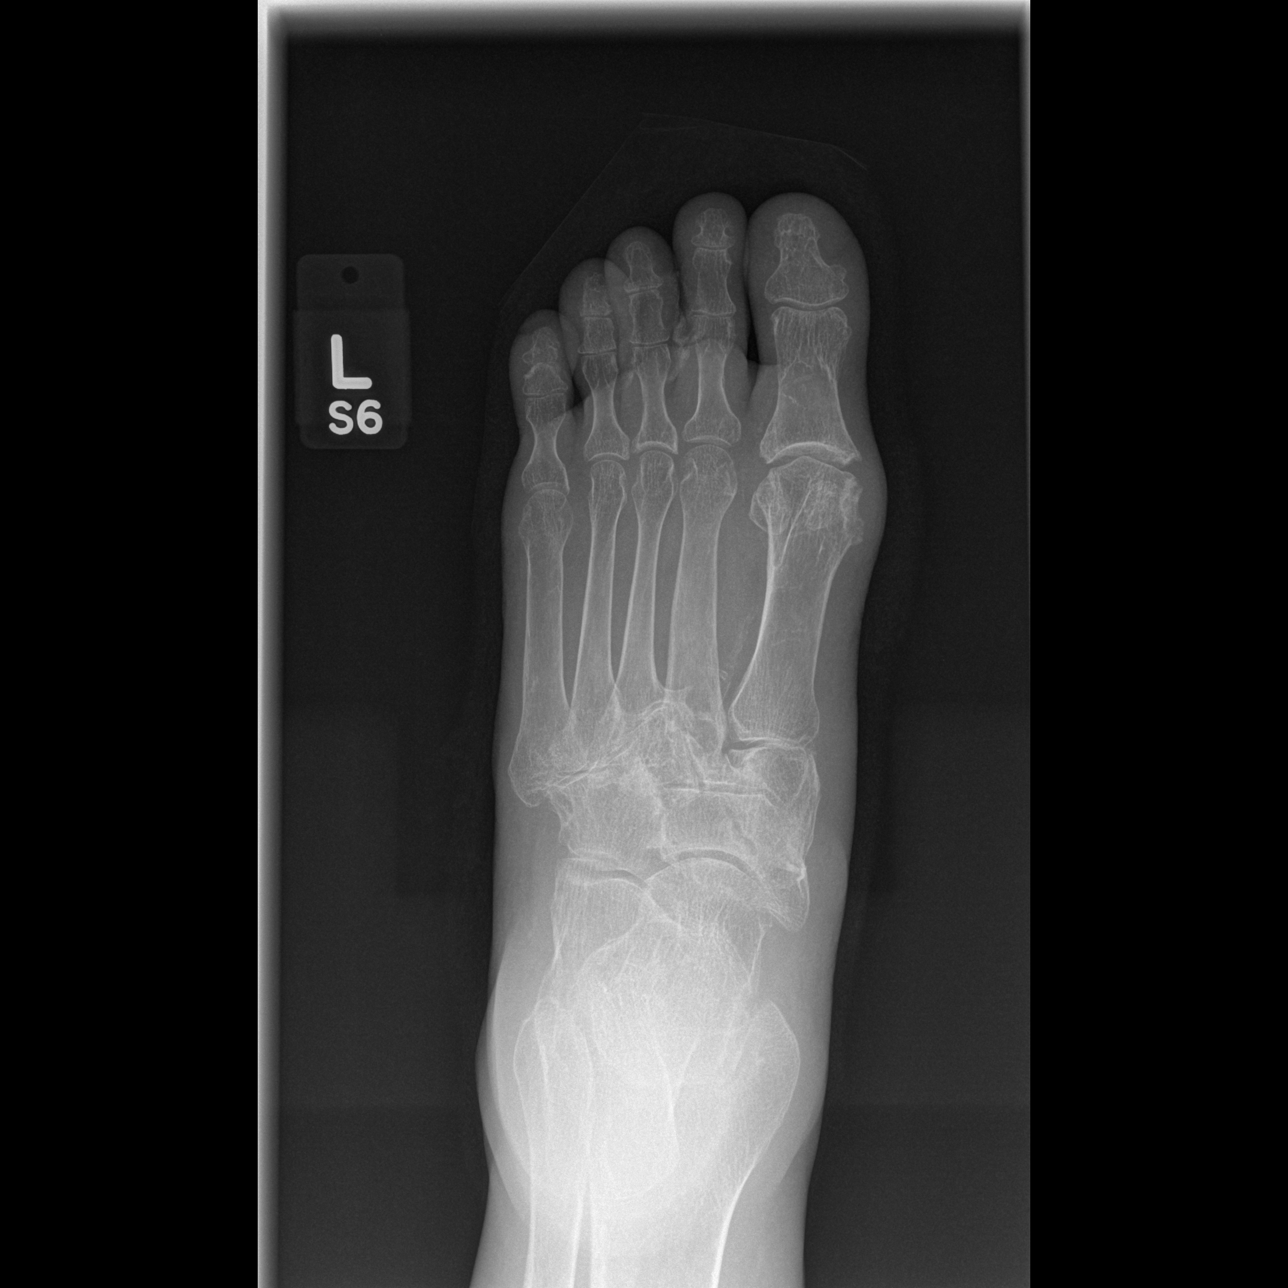

[t foot oblique left]
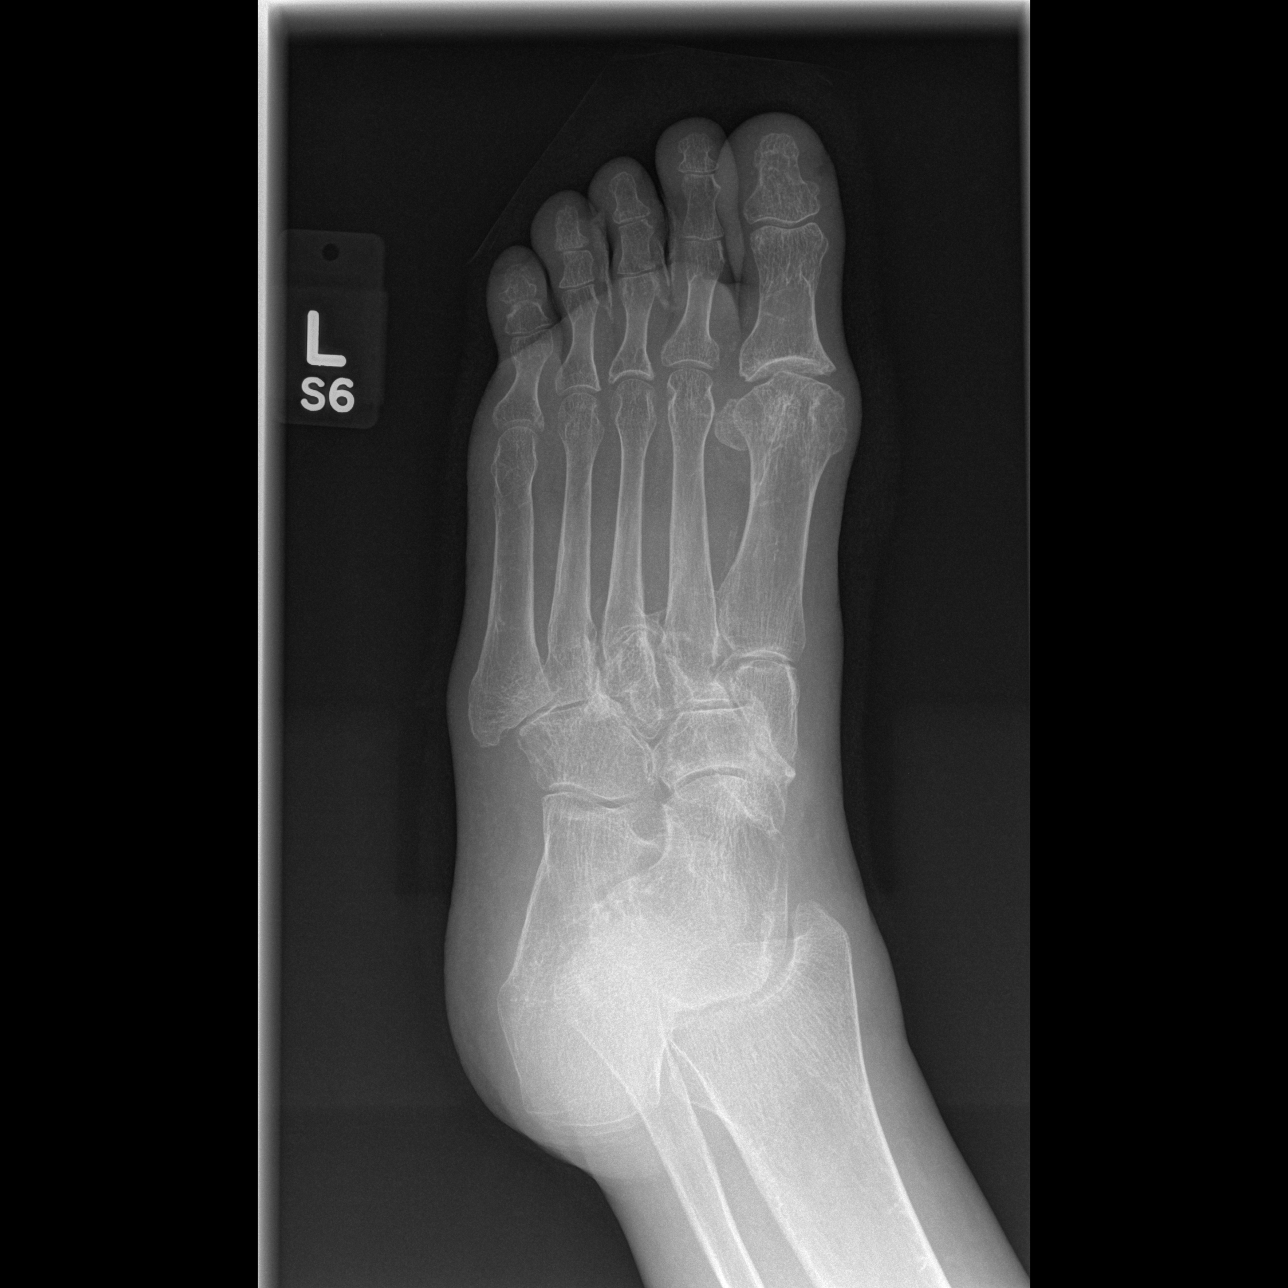

[t foot lat left]
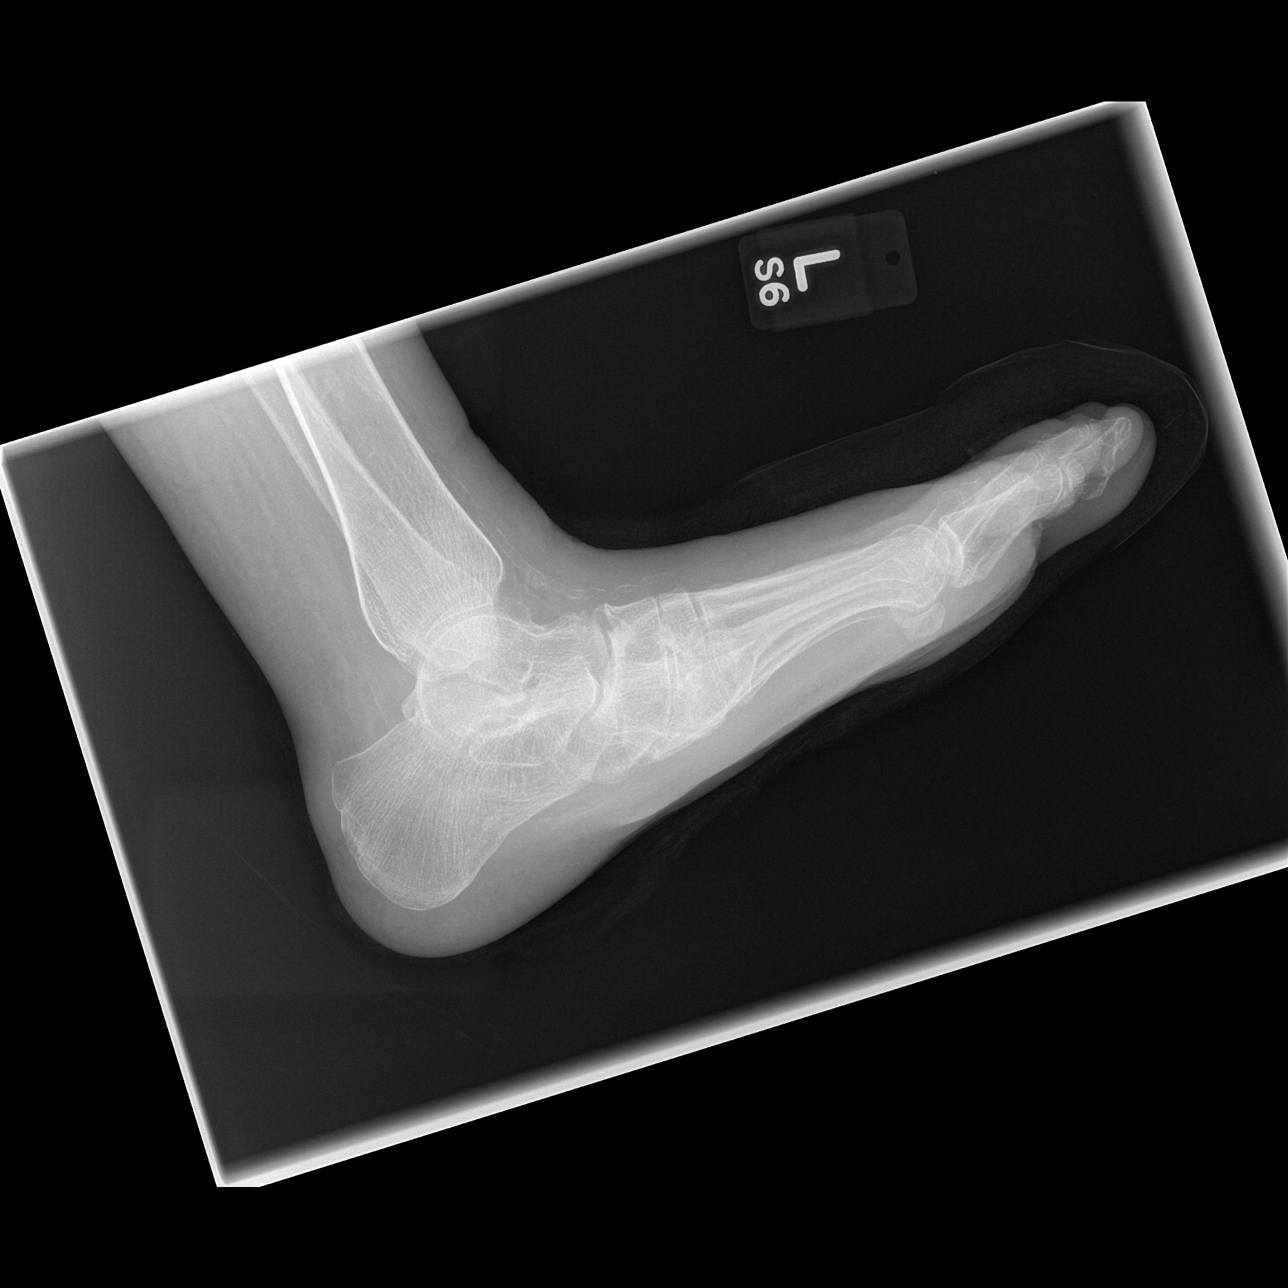

[3 of 3 positions shown; findings below may reference images not displayed]

FINDINGS: Three views of the left foot submitted. There is diffuse osteopenia.
No acute fracture or subluxation. No bony erosion or periosteal
reaction to suggest osteomyelitis. There is flatfoot deformity.
Degenerative changes tarsal region. Atherosclerotic vascular
calcifications.
IMPRESSION: No acute fracture or subluxation. No definite evidence of
osteomyelitis. Flatfoot deformity. Diffuse osteopenia.

## 2014-09-26 ENCOUNTER — Encounter: Payer: Self-pay | Admitting: Internal Medicine

## 2014-09-26 DIAGNOSIS — A0472 Enterocolitis due to Clostridium difficile, not specified as recurrent: Secondary | ICD-10-CM | POA: Insufficient documentation

## 2014-09-26 DIAGNOSIS — Z89431 Acquired absence of right foot: Secondary | ICD-10-CM | POA: Insufficient documentation

## 2014-09-26 DIAGNOSIS — L97529 Non-pressure chronic ulcer of other part of left foot with unspecified severity: Secondary | ICD-10-CM

## 2014-09-26 DIAGNOSIS — E10621 Type 1 diabetes mellitus with foot ulcer: Secondary | ICD-10-CM | POA: Insufficient documentation

## 2014-09-26 HISTORY — DX: Enterocolitis due to Clostridium difficile, not specified as recurrent: A04.72

## 2014-09-26 NOTE — Assessment & Plan Note (Signed)
Started on Lisinopril 5 mg without worsening of renal disease or hyperkalemia

## 2014-09-26 NOTE — Assessment & Plan Note (Signed)
MRI of foot showed plantar ulcer, with DJD but no osteo;Plan - off loading sow, 2 weeks abx, PT, WC

## 2014-09-26 NOTE — Assessment & Plan Note (Signed)
Tx with vancomycin for a week without much improvement, put on dificid and continued vancomycin with resolution

## 2014-09-26 NOTE — Assessment & Plan Note (Signed)
2/2 to diabetic foot wound, ulcer, abscess and inevitably osteomylitis leading to surgery. Pt has R foot casted.Pt is on 2 weeks antibiotics, was d/c on keflex.

## 2014-09-26 NOTE — Assessment & Plan Note (Signed)
Insulin pump, pt manages, not well controlled;has had education; A1c 9.7

## 2014-09-26 NOTE — Assessment & Plan Note (Signed)
Resulting in osteomylitis of toes 34 and 4, s/o I and D for foot abscess, s/p wound vac, leading to surgery.

## 2014-09-26 NOTE — Assessment & Plan Note (Signed)
Most recent Hb 9; treated with iron

## 2014-09-26 NOTE — Assessment & Plan Note (Signed)
Felt to be near baseline. GFR -49

## 2014-09-26 NOTE — Assessment & Plan Note (Signed)
2/2 foot wound with abscess, wound vac and surgery by [facility] orthopedists who were the second opinion for the [facility] physicians

## 2014-09-28 ENCOUNTER — Encounter (HOSPITAL_COMMUNITY): Payer: Medicaid Other

## 2014-09-29 ENCOUNTER — Encounter (HOSPITAL_COMMUNITY): Payer: Medicaid Other

## 2014-09-29 ENCOUNTER — Encounter: Payer: Self-pay | Admitting: Internal Medicine

## 2014-09-29 ENCOUNTER — Non-Acute Institutional Stay (SKILLED_NURSING_FACILITY): Payer: Medicare Other | Admitting: Internal Medicine

## 2014-09-29 DIAGNOSIS — R600 Localized edema: Secondary | ICD-10-CM | POA: Diagnosis not present

## 2014-09-29 NOTE — Progress Notes (Signed)
MRN: [id number] Name: [name]  Sex: female Age: 44 y.o. DOB: 17-Jan-1971  [place] #: [name] Facility/Room:511 Level Of Care: SNF Provider: Inocencio Homes D Emergency Contacts: Extended Emergency Contact Information Primary Emergency Contact: Sammie Bench, [place] Phone: [phone] Relation: Sister  Code Status: FULL  Allergies: Ciprofloxacin and Cleocin  Chief Complaint  Patient presents with  . Acute Visit    HPI: Patient is 44 y.o. female who is being seen for LLE edema for 2 days.  Past Medical History  Diagnosis Date  . Diabetes mellitus   . Neuropathy in diabetes   . Hypertension   . Hypercholesteremia   . H/O seasonal allergies   . Anemia   . Left foot infection     Past Surgical History  Procedure Laterality Date  . Cesarean section    . Eye surgery    . Hemrrhoidectomy    . Peripherally inserted central catheter insertion    . I&d extremity Right 06/10/2014    Procedure: IRRIGATION AND DEBRIDEMENT Right Foot;  Surgeon: Newt Minion, MD;  Location: [facility];  Service: Orthopedics;  Laterality: Right;  . Skin split graft Right 06/15/2014    Procedure: SPLIT THICKNESS SKIN GRAFT RIGHT FOOT;  Surgeon: Newt Minion, MD;  Location: [facility];  Service: Orthopedics;  Laterality: Right;      Medication List       This list is accurate as of: 09/29/14 11:59 PM.  Always use your most recent med list.               albuterol 108 (90 BASE) MCG/ACT inhaler  Commonly known as:  PROVENTIL HFA;VENTOLIN HFA  Inhale 2 puffs into the lungs every 6 (six) hours as needed. S.O.B.     calcitRIOL 0.25 MCG capsule  Commonly known as:  ROCALTROL  Take 0.25 mcg by mouth daily. M,W,F     CALCIUM PO  Take 1 tablet by mouth daily.     diphenhydrAMINE 25 mg capsule  Commonly known as:  BENADRYL  Take 1 capsule (25 mg total) by mouth every 6 (six) hours as needed for itching.     feeding supplement (RESOURCE  BREEZE) Liqd  Take 1 Container by mouth 2 (two) times daily between meals.     ferrous sulfate 325 (65 FE) MG tablet  Take 325 mg by mouth 3 (three) times daily with meals.     folic acid 1 MG tablet  Commonly known as:  FOLVITE  Take 1 mg by mouth daily.     heparin 5000 UNIT/ML injection  Inject 1 mL (5,000 Units total) into the skin every 8 (eight) hours.     insulin aspart 100 UNIT/ML injection  Commonly known as:  novoLOG  Use max 65 units per day with pump     pravastatin 40 MG tablet  Commonly known as:  PRAVACHOL  TAKE 1/2 TABLETS (20 MG TOTAL) BY MOUTH DAILY.     pyridoxine 100 MG tablet  Commonly known as:  B-6  Take 100 mg by mouth daily.     saccharomyces boulardii 250 MG capsule  Commonly known as:  FLORASTOR  Take 2 capsules (500 mg total) by mouth 2 (two) times daily.     sodium chloride 0.65 % Soln nasal spray  Commonly known as:  OCEAN  Place 1 spray into both nostrils as needed for congestion.     vancomycin 50 mg/mL oral solution  Commonly known as:  VANCOCIN  Take 10 mLs (500 mg total) by mouth every 6 (six) hours.     Vancomycin 750 MG/150ML Soln  Commonly known as:  VANCOCIN  Inject 150 mLs (750 mg total) into the vein daily.     vitamin B-12 100 MCG tablet  Commonly known as:  CYANOCOBALAMIN  Take 100 mcg by mouth daily.        No orders of the defined types were placed in this encounter.    Immunization History  Administered Date(s) Administered  . Influenza Split 02/15/2011  . Pneumococcal Conjugate-13 02/15/2011  . Tdap 08/11/2012    History  Substance Use Topics  . Smoking status: Former Smoker    Quit date: 05/16/2008  . Smokeless tobacco: Never Used  . Alcohol Use: 0.5 oz/week    1 drink(s) per week     Comment: Occasional    Review of Systems  DATA OBTAINED: from patient, nurse; per nurse pt has been non compliant with her BP meds and that is why the edema has occurred GENERAL:  no fevers, fatigue, appetite  changes SKIN: No itching, rash HEENT: No complaint RESPIRATORY: No cough, wheezing, SOB CARDIAC: No chest pain, palpitations, +lower extremity edema  GI: No abdominal pain, No N/V/D or constipation, No heartburn or reflux  GU: No dysuria, frequency or urgency, or incontinence  MUSCULOSKELETAL: No unrelieved bone/joint pain NEUROLOGIC: No headache, dizziness  PSYCHIATRIC: No overt anxiety or sadness  Filed Vitals:   09/29/14 2028  BP: 139/75  Pulse: 72  Temp: 97 F (36.1 C)  Resp: 80    Physical Exam  GENERAL APPEARANCE: Alert, conversant, No acute distress  SKIN: No diaphoresis rash HEENT: Unremarkable RESPIRATORY: Breathing is even, unlabored. Lung sounds are clear   CARDIOVASCULAR: Heart RRR no murmurs, rubs or gallops. + peripheral edema BLE , no calf tender or tightness to palpation GASTROINTESTINAL: Abdomen is  not distended  GENITOURINARY: Bladder non tender, not distended  MUSCULOSKELETAL: RLE with post-op cast NEUROLOGIC: Cranial nerves 2-12 grossly intact. Moves all extremities PSYCHIATRIC: Mood and affect appropriate to situation, no behavioral issues  Patient Active Problem List   Diagnosis Date Noted  . Bilateral lower extremity edema 10/04/2014  . Status post transmetatarsal amputation of right foot 09/26/2014  . Diabetic ulcer of left foot associated with type 1 diabetes mellitus 09/26/2014  . C. difficile diarrhea 09/26/2014  . Sepsis 09/01/2014  . Cellulitis 09/01/2014  . Nausea and vomiting 09/01/2014  . Diarrhea 09/01/2014  . AKI (acute kidney injury) 06/13/2014  . Cellulitis of right foot 06/02/2014  . Diabetic ulcer of right foot 06/02/2014  . DM type 1 causing renal disease 06/13/2013  . Osteomyelitis 06/12/2013  . CKD (chronic kidney disease), stage III 06/12/2013  . UTI (urinary tract infection) 03/25/2013  . CAP (community acquired pneumonia) 03/25/2013  . Acute blood loss anemia 03/25/2013  . Acute renal failure 03/25/2013  . Acidosis  03/25/2013  . Leukocytosis, unspecified 03/25/2013  . DVT prophylaxis 03/25/2013  . Iron deficiency anemia 05/09/2011  . Charcot foot due to diabetes mellitus 05/09/2011  . Foot ulcer due to secondary DM 05/09/2011  . Type 1 diabetes mellitus with neurological manifestations, uncontrolled   . Hypertension   . Hypercholesteremia     CBC    Component Value Date/Time   WBC 6.3 09/05/2014 0505   RBC 3.52* 09/05/2014 0505   RBC 3.36* 06/13/2013 0450   HGB 9.8* 09/05/2014 0505   HCT 30.6* 09/05/2014 0505   PLT 524* 09/05/2014 0505  MCV 86.9 09/05/2014 0505   LYMPHSABS 0.4* 09/01/2014 1023   MONOABS 0.6 09/01/2014 1023   EOSABS 0.0 09/01/2014 1023   BASOSABS 0.0 09/01/2014 1023    CMP     Component Value Date/Time   NA 138 09/05/2014 0505   K 4.2 09/05/2014 0505   CL 118* 09/05/2014 0505   CO2 14* 09/05/2014 0505   GLUCOSE 183* 09/05/2014 0505   BUN 17 09/05/2014 0505   CREATININE 1.10* 09/05/2014 0505   CREATININE 1.11* 05/24/2011 1044   CALCIUM 7.4* 09/05/2014 0505   CALCIUM 8.5 09/01/2009 2241   PROT 4.9* 09/02/2014 0526   ALBUMIN 1.9* 09/02/2014 0526   AST 17 09/02/2014 0526   ALT 35 09/02/2014 0526   ALKPHOS 110 09/02/2014 0526   BILITOT 0.2* 09/02/2014 0526   GFRNONAA >60 09/05/2014 0505   GFRAA >60 09/05/2014 0505    Assessment and Plan  Bilateral lower extremity edema Pt says that she has been on lasix in the past but she is not on it now and would like to be. Will put her on lasix daily for 3 days then prn;also considered DVT but pt's exam and no pain work against it. Pt says she has been [place] numerous times. I told her to monitor size and sx and we will also.    Hennie Duos, MD

## 2014-09-30 ENCOUNTER — Non-Acute Institutional Stay (SKILLED_NURSING_FACILITY): Payer: Medicare Other | Admitting: Internal Medicine

## 2014-09-30 ENCOUNTER — Encounter: Payer: Self-pay | Admitting: Internal Medicine

## 2014-09-30 DIAGNOSIS — E1051 Type 1 diabetes mellitus with diabetic peripheral angiopathy without gangrene: Secondary | ICD-10-CM

## 2014-09-30 DIAGNOSIS — I739 Peripheral vascular disease, unspecified: Secondary | ICD-10-CM

## 2014-09-30 DIAGNOSIS — A047 Enterocolitis due to Clostridium difficile: Secondary | ICD-10-CM

## 2014-09-30 DIAGNOSIS — I10 Essential (primary) hypertension: Secondary | ICD-10-CM | POA: Diagnosis not present

## 2014-09-30 DIAGNOSIS — L97509 Non-pressure chronic ulcer of other part of unspecified foot with unspecified severity: Secondary | ICD-10-CM | POA: Diagnosis not present

## 2014-09-30 DIAGNOSIS — E1059 Type 1 diabetes mellitus with other circulatory complications: Secondary | ICD-10-CM

## 2014-09-30 DIAGNOSIS — D509 Iron deficiency anemia, unspecified: Secondary | ICD-10-CM | POA: Diagnosis not present

## 2014-09-30 DIAGNOSIS — E13621 Other specified diabetes mellitus with foot ulcer: Secondary | ICD-10-CM | POA: Diagnosis not present

## 2014-09-30 DIAGNOSIS — Z89431 Acquired absence of right foot: Secondary | ICD-10-CM | POA: Diagnosis not present

## 2014-09-30 DIAGNOSIS — A0472 Enterocolitis due to Clostridium difficile, not specified as recurrent: Secondary | ICD-10-CM

## 2014-09-30 NOTE — Progress Notes (Signed)
Patient ID: [name], female   DOB: [birth year], 44 y.o.   MRN: [id number]         MRN: [id number] Name: [name]  Sex: female Age: 44 y.o. DOB: 1970/09/25  [place] #: [name] Facility/Room:513 Level Of Care: SNF Provider: Wille Celeste Emergency Contacts: Extended Emergency Contact Information Primary Emergency Contact: Sammie Bench, [place] of [place] Phone: [phone] Relation: Sister  Code Status: FULL  Allergies: Ciprofloxacin and Cleocin  Chief Complaint  Patient presents with  . Discharge Note    HPI: Patient is 44 y.o. female who was admitted to SNF for OT/PT after being hospitalized for transmetatarsal amputation 2/2 DM type 1  She apparently has worked with somewhat therapy although apparently there have been some compliance issues per nursing-nonetheless she appears to be stable she will need PT and OT on discharge--he  Tonight she has no complaints area  Dr. Sheppard Coil did see her yesterday for some concerns about lower extremity edema and her Lasix was made routine for 3 days she does have an order for when necessary Lasix 20 mg as well.    Past Medical History  Diagnosis Date  . Diabetes mellitus   . Neuropathy in diabetes   . Hypertension   . Hypercholesteremia   . H/O seasonal allergies   . Anemia   . Left foot infection     Past Surgical History  Procedure Laterality Date  . Cesarean section    . Eye surgery    . Hemrrhoidectomy    . Peripherally inserted central catheter insertion    . I&d extremity Right 06/10/2014    Procedure: IRRIGATION AND DEBRIDEMENT Right Foot;  Surgeon: Newt Minion, MD;  Location: [facility];  Service: Orthopedics;  Laterality: Right;  . Skin split graft Right 06/15/2014    Procedure: SPLIT THICKNESS SKIN GRAFT RIGHT FOOT;  Surgeon: Newt Minion, MD;  Location: [facility];  Service: Orthopedics;  Laterality: Right;      Medication List       This list is  accurate as of: 09/30/14  4:34 PM.  Always use your most recent med list.               albuterol 108 (90 BASE) MCG/ACT inhaler  Commonly known as:  PROVENTIL HFA;VENTOLIN HFA  Inhale 2 puffs into the lungs every 6 (six) hours as needed. S.O.B.     calcitRIOL 0.25 MCG capsule  Commonly known as:  ROCALTROL  Take 0.25 mcg by mouth daily. M,W,F     CALCIUM PO  Take 1 tablet by mouth daily.     feeding supplement (RESOURCE BREEZE) Liqd  Take 1 Container by mouth 2 (two) times daily between meals.     fenoprofen 600 MG Tabs tablet  Commonly known as:  NALFON  Take 600 mg by mouth.     ferrous sulfate 325 (65 FE) MG tablet  Take 325 mg by mouth 3 (three) times daily with meals.     fidaxomicin 200 MG Tabs tablet  Commonly known as:  DIFICID  Take 200 mg by mouth 2 (two) times daily.     folic acid 1 MG tablet  Commonly known as:  FOLVITE  Take 1 mg by mouth daily.     furosemide 20 MG tablet  Commonly known as:  LASIX  Take 20 mg by mouth daily as needed.     lactobacillus acidophilus Tabs tablet  Take  1 tablet by mouth 3 (three) times daily.     lisinopril 5 MG tablet  Commonly known as:  PRINIVIL,ZESTRIL  Take 5 mg by mouth daily.     pravastatin 40 MG tablet  Commonly known as:  PRAVACHOL  TAKE 1/2 TABLETS (20 MG TOTAL) BY MOUTH DAILY.     pyridoxine 100 MG tablet  Commonly known as:  B-6  Take 100 mg by mouth daily.     sodium bicarbonate 650 MG tablet  Take 650 mg by mouth 3 (three) times daily.     vitamin B-12 100 MCG tablet  Commonly known as:  CYANOCOBALAMIN  Take 100 mcg by mouth daily.        Meds ordered this encounter  Medications  . fenoprofen (NALFON) 600 MG TABS tablet    Sig: Take 600 mg by mouth.  . sodium bicarbonate 650 MG tablet    Sig: Take 650 mg by mouth 3 (three) times daily.  . furosemide (LASIX) 20 MG tablet    Sig: Take 20 mg by mouth daily as needed.  Marland Kitchen lisinopril (PRINIVIL,ZESTRIL) 5 MG tablet    Sig: Take 5 mg by mouth  daily.  . fidaxomicin (DIFICID) 200 MG TABS tablet    Sig: Take 200 mg by mouth 2 (two) times daily.  Marland Kitchen lactobacillus acidophilus (BACID) TABS tablet    Sig: Take 1 tablet by mouth 3 (three) times daily.    Immunization History  Administered Date(s) Administered  . Influenza Split 02/15/2011  . Pneumococcal Conjugate-13 02/15/2011  . Tdap 08/11/2012    History  Substance Use Topics  . Smoking status: Former Smoker    Quit date: 05/16/2008  . Smokeless tobacco: Never Used  . Alcohol Use: 0.5 oz/week    1 drink(s) per week     Comment: Occasional    Family history is noncontributory    Review of Systems  DATA OBTAINED: from patient, nurse; PT states some non compliance with how she is using her walker GENERAL:  no fevers, fatigue, appetite changes; SKIN: No itching, rash--does have chronic foot wounds again being treated EYES: No eye pain, redness, discharge EARS: No earache, tinnitus, change in hearing NOSE: No congestion, drainage or bleeding  MOUTH/THROAT: No mouth or tooth pain, No sore throat RESPIRATORY: No cough, wheezing, SOB CARDIAC: No chest pain, palpitations,has mild  lower extremity edema  GI: No abdominal pain, No N/V/D or constipation, No heartburn or reflux  GU: No dysuria, frequency or urgency, or incontinence  MUSCULOSKELETAL: No unrelieved bone/joint pain NEUROLOGIC: No headache, dizziness or focal weakness PSYCHIATRIC: No overt anxiety or sadness, No behavior issue.   Filed Vitals:   09/30/14 1607  BP: 134/74  Pulse: 84  Temp: 98.1 F (36.7 C)  Resp: 18    Physical Exam  GENERAL APPEARANCE: Alert, conversant,  No acute distress.  SKIN: No diaphoresis rash HEAD: Normocephalic, atraumatic  EYES: Conjunctiva/lids clear. Pupils round, reactive. EOMs intact.  EARS: External exam WNL, canals clear. Hearing grossly normal.  NOSE: No deformity or discharge.  MOUTH/THROAT: Lips w/o lesions  RESPIRATORY: Breathing is even, unlabored. Lung sounds  are clear   CARDIOVASCULAR: Heart RRR no murmurs, rubs or gallops. Some mild peripheral edema.   GASTROINTESTINAL: Abdomen is soft, non-tender, not distended w/ normal bowel sounds. G MUSCULOSKELETAL: cast on R foot,wrapping  L foot NEUROLOGIC:  Cranial nerves 2-12 grossly intact. Moves all extremities  PSYCHIATRIC: Pleasant and appropriate, no behavioral issues  Patient Active Problem List   Diagnosis Date Noted  .  Status post transmetatarsal amputation of right foot 09/26/2014  . Diabetic ulcer of left foot associated with type 1 diabetes mellitus 09/26/2014  . C. difficile diarrhea 09/26/2014  . Sepsis 09/01/2014  . Cellulitis 09/01/2014  . Nausea and vomiting 09/01/2014  . Diarrhea 09/01/2014  . AKI (acute kidney injury) 06/13/2014  . Cellulitis of right foot 06/02/2014  . Diabetic ulcer of right foot 06/02/2014  . DM type 1 causing renal disease 06/13/2013  . Osteomyelitis 06/12/2013  . CKD (chronic kidney disease), stage III 06/12/2013  . UTI (urinary tract infection) 03/25/2013  . CAP (community acquired pneumonia) 03/25/2013  . Acute blood loss anemia 03/25/2013  . Acute renal failure 03/25/2013  . Acidosis 03/25/2013  . Leukocytosis, unspecified 03/25/2013  . DVT prophylaxis 03/25/2013  . Iron deficiency anemia 05/09/2011  . Charcot foot due to diabetes mellitus 05/09/2011  . Foot ulcer due to secondary DM 05/09/2011  . Type 1 diabetes mellitus with neurological manifestations, uncontrolled   . Hypertension   . Hypercholesteremia     Labs.  09/18/2014.  Sodium 140 potassium 4.3 BUN 24 creatinine 1.2 BUN 274  WBC 5.6 hemoglobin 8.8 platelets 444  CBC    Component Value Date/Time   WBC 6.3 09/05/2014 0505   RBC 3.52* 09/05/2014 0505   RBC 3.36* 06/13/2013 0450   HGB 9.8* 09/05/2014 0505   HCT 30.6* 09/05/2014 0505   PLT 524* 09/05/2014 0505   MCV 86.9 09/05/2014 0505   LYMPHSABS 0.4* 09/01/2014 1023   MONOABS 0.6 09/01/2014 1023   EOSABS 0.0 09/01/2014  1023   BASOSABS 0.0 09/01/2014 1023    CMP     Component Value Date/Time   NA 138 09/05/2014 0505   K 4.2 09/05/2014 0505   CL 118* 09/05/2014 0505   CO2 14* 09/05/2014 0505   GLUCOSE 183* 09/05/2014 0505   BUN 17 09/05/2014 0505   CREATININE 1.10* 09/05/2014 0505   CREATININE 1.11* 05/24/2011 1044   CALCIUM 7.4* 09/05/2014 0505   CALCIUM 8.5 09/01/2009 2241   PROT 4.9* 09/02/2014 0526   ALBUMIN 1.9* 09/02/2014 0526   AST 17 09/02/2014 0526   ALT 35 09/02/2014 0526   ALKPHOS 110 09/02/2014 0526   BILITOT 0.2* 09/02/2014 0526   GFRNONAA >60 09/05/2014 0505   GFRAA >60 09/05/2014 0505    Assessment and Plan   History of transmetatarsal amputation right foot-secondary to diabetic foot ulcers with history of abscess osteomyelitis she's completed 2 weeks of anti-biotics. -Appears she also has follow-up with foot and ankle specialist on July 5 --she will need followed by PT and OT  History of diabetic ulcer of left foot associated with type 1 diabetes-again she has completed 2 wees of anti-biotics MRI did not show osteomyelitis   History of type 1 diabetes hemoglobin A1c most recently was 9.7-she is on an insulin pump she manages this herself.  History of iron deficiency anemia-she is on iron hemoglobin 8.8 on June 18 will update this before discharge.  Chronic kidney disease-on June 18 creatinine was 1.2 BUN of 24 we will update this before discharge.  History of hypertension-lisinopril apparently was started during her most recent hospitalization most recent blood pressures 134/74-148/76-142/79 will defer aggressive follow-up of this to primary care provider.  History of C. difficile in hospital this was treated with vancomycinDificid was also started apparently helped she continues on the Dificid She has follow-up mid July with digestive health in [place]  Patient will need continued PT and OT for strengthening with her history of foot  wounds with recent   amputation-she is appealing to the insurance company to possibly staying skilled nursing longer however this is somewhat up in the air.  Clinically she appears to be doing well although apparently there are some issues with cooperation physical therapy.  [id number] note greater than 30 minutes spent on this discharge summary-prescriptions have been written            LASSEN, ARLO C,

## 2014-10-01 ENCOUNTER — Encounter (HOSPITAL_COMMUNITY): Payer: Medicaid Other

## 2014-10-01 DIAGNOSIS — R7881 Bacteremia: Secondary | ICD-10-CM

## 2014-10-01 DIAGNOSIS — I82409 Acute embolism and thrombosis of unspecified deep veins of unspecified lower extremity: Secondary | ICD-10-CM

## 2014-10-01 HISTORY — DX: Acute embolism and thrombosis of unspecified deep veins of unspecified lower extremity: I82.409

## 2014-10-01 HISTORY — DX: Methicillin susceptible Staphylococcus aureus infection as the cause of diseases classified elsewhere: R78.81

## 2014-10-04 ENCOUNTER — Encounter: Payer: Self-pay | Admitting: Internal Medicine

## 2014-10-04 DIAGNOSIS — R600 Localized edema: Secondary | ICD-10-CM | POA: Insufficient documentation

## 2014-10-04 NOTE — Assessment & Plan Note (Addendum)
Pt says that she has been on lasix in the past but she is not on it now and would like to be. Will put her on lasix daily for 3 days then prn;also considered DVT but pt's exam and no pain work against it. Pt says she has been [place] numerous times. I told her to monitor size of legs and sx and we will also.

## 2014-10-05 ENCOUNTER — Encounter (HOSPITAL_COMMUNITY): Payer: Medicaid Other

## 2014-10-06 ENCOUNTER — Non-Acute Institutional Stay (SKILLED_NURSING_FACILITY): Payer: Medicare Other | Admitting: Internal Medicine

## 2014-10-06 ENCOUNTER — Encounter (HOSPITAL_COMMUNITY): Payer: Medicaid Other

## 2014-10-06 ENCOUNTER — Encounter: Payer: Self-pay | Admitting: Internal Medicine

## 2014-10-06 DIAGNOSIS — Z89431 Acquired absence of right foot: Secondary | ICD-10-CM | POA: Diagnosis not present

## 2014-10-06 DIAGNOSIS — L97529 Non-pressure chronic ulcer of other part of left foot with unspecified severity: Secondary | ICD-10-CM

## 2014-10-06 DIAGNOSIS — M869 Osteomyelitis, unspecified: Secondary | ICD-10-CM | POA: Diagnosis not present

## 2014-10-06 DIAGNOSIS — E10621 Type 1 diabetes mellitus with foot ulcer: Secondary | ICD-10-CM

## 2014-10-06 DIAGNOSIS — E875 Hyperkalemia: Secondary | ICD-10-CM | POA: Insufficient documentation

## 2014-10-06 DIAGNOSIS — I10 Essential (primary) hypertension: Secondary | ICD-10-CM | POA: Diagnosis not present

## 2014-10-06 DIAGNOSIS — N183 Chronic kidney disease, stage 3 unspecified: Secondary | ICD-10-CM

## 2014-10-06 DIAGNOSIS — D509 Iron deficiency anemia, unspecified: Secondary | ICD-10-CM | POA: Diagnosis not present

## 2014-10-06 DIAGNOSIS — A047 Enterocolitis due to Clostridium difficile: Secondary | ICD-10-CM | POA: Diagnosis not present

## 2014-10-06 DIAGNOSIS — E1041 Type 1 diabetes mellitus with diabetic mononeuropathy: Secondary | ICD-10-CM

## 2014-10-06 DIAGNOSIS — IMO0002 Reserved for concepts with insufficient information to code with codable children: Secondary | ICD-10-CM

## 2014-10-06 DIAGNOSIS — A0472 Enterocolitis due to Clostridium difficile, not specified as recurrent: Secondary | ICD-10-CM

## 2014-10-06 DIAGNOSIS — E1049 Type 1 diabetes mellitus with other diabetic neurological complication: Secondary | ICD-10-CM

## 2014-10-06 DIAGNOSIS — E1065 Type 1 diabetes mellitus with hyperglycemia: Secondary | ICD-10-CM

## 2014-10-06 NOTE — Progress Notes (Addendum)
MRN: [id number] Name: [name]  Sex: female Age: 44 y.o. DOB: 02-28-1971  [place] #: [name] Facility/Room:511 Level Of Care: SNF Provider: Inocencio Homes D Emergency Contacts: Extended Emergency Contact Information Primary Emergency Contact: Sammie Bench, [place] Phone: [phone] Relation: Sister  Code Status: FULL  Allergies: Ciprofloxacin and Cleocin  Chief Complaint  Patient presents with  . Discharge Note    HPI: Patient is 44 y.o. female who was admitted to SNF for OT/PT after a transmetatarsal amputation of R foot 2/2 osteomylitis with Type 1  DM who is being discharged tomorrow, originally it was supposed to be last week, but some new concerns/info are available today and are evaluated.  Past Medical History  Diagnosis Date  . Diabetes mellitus   . Neuropathy in diabetes   . Hypertension   . Hypercholesteremia   . H/O seasonal allergies   . Anemia   . Left foot infection     Past Surgical History  Procedure Laterality Date  . Cesarean section    . Eye surgery    . Hemrrhoidectomy    . Peripherally inserted central catheter insertion    . I&d extremity Right 06/10/2014    Procedure: IRRIGATION AND DEBRIDEMENT Right Foot;  Surgeon: Newt Minion, MD;  Location: [place];  Service: Orthopedics;  Laterality: Right;  . Skin split graft Right 06/15/2014    Procedure: SPLIT THICKNESS SKIN GRAFT RIGHT FOOT;  Surgeon: Newt Minion, MD;  Location: [place];  Service: Orthopedics;  Laterality: Right;      Medication List       This list is accurate as of: 10/06/14  4:30 PM.  Always use your most recent med list.               albuterol 108 (90 BASE) MCG/ACT inhaler  Commonly known as:  PROVENTIL HFA;VENTOLIN HFA  Inhale 2 puffs into the lungs every 6 (six) hours as needed. S.O.B.     calcitRIOL 0.25 MCG capsule  Commonly known as:  ROCALTROL  Take 0.25 mcg by mouth daily. M,W,F     CALCIUM PO  Take 1  tablet by mouth daily.     ferrous sulfate 325 (65 FE) MG tablet  Take 325 mg by mouth 3 (three) times daily with meals.     fidaxomicin 200 MG Tabs tablet  Commonly known as:  DIFICID  Take 200 mg by mouth 2 (two) times daily.     folic acid 1 MG tablet  Commonly known as:  FOLVITE  Take 1 mg by mouth daily.     furosemide 20 MG tablet  Commonly known as:  LASIX  Take 20 mg by mouth daily as needed.     lactobacillus acidophilus Tabs tablet  Take 1 tablet by mouth 3 (three) times daily.     pravastatin 40 MG tablet  Commonly known as:  PRAVACHOL  TAKE 1/2 TABLETS (20 MG TOTAL) BY MOUTH DAILY.     pyridoxine 100 MG tablet  Commonly known as:  B-6  Take 100 mg by mouth daily.     sodium bicarbonate 650 MG tablet  Take 650 mg by mouth 3 (three) times daily.     vitamin B-12 100 MCG tablet  Commonly known as:  CYANOCOBALAMIN  Take 200 mcg by mouth daily.        No orders of the defined types were placed in this encounter.  Immunization History  Administered Date(s) Administered  . Influenza Split 02/15/2011  . Pneumococcal Conjugate-13 02/15/2011  . Tdap 08/11/2012    History  Substance Use Topics  . Smoking status: Former Smoker    Quit date: 05/16/2008  . Smokeless tobacco: Never Used  . Alcohol Use: 0.5 oz/week    1 drink(s) per week     Comment: Occasional    Filed Vitals:   10/06/14 1535  BP: 125/65  Pulse: 53  Temp: 97 F (36.1 C)  Resp: 18    Physical Exam  GENERAL APPEARANCE: Alert, conversant. No acute distress.  HEENT: Unremarkable. RESPIRATORY: Breathing is even, unlabored. Lung sounds are clear   CARDIOVASCULAR: Heart RRR no murmurs, rubs or gallops. trac peripheral edema.  GASTROINTESTINAL: Abdomen is soft, non-tender, not distended w/ normal bowel sounds.  NEUROLOGIC: Cranial nerves 2-12 grossly intact. Moves all extremities  Patient Active Problem List   Diagnosis Date Noted  . Hyperkalemia 10/06/2014  . Bilateral lower  extremity edema 10/04/2014  . Status post transmetatarsal amputation of right foot 09/26/2014  . Diabetic ulcer of left foot associated with type 1 diabetes mellitus 09/26/2014  . C. difficile diarrhea 09/26/2014  . Sepsis 09/01/2014  . Cellulitis 09/01/2014  . Nausea and vomiting 09/01/2014  . Diarrhea 09/01/2014  . AKI (acute kidney injury) 06/13/2014  . Cellulitis of right foot 06/02/2014  . Diabetic ulcer of right foot 06/02/2014  . DM type 1 causing renal disease 06/13/2013  . Osteomyelitis 06/12/2013  . CKD (chronic kidney disease), stage III 06/12/2013  . UTI (urinary tract infection) 03/25/2013  . CAP (community acquired pneumonia) 03/25/2013  . Acute blood loss anemia 03/25/2013  . Acute renal failure 03/25/2013  . Acidosis 03/25/2013  . Leukocytosis, unspecified 03/25/2013  . DVT prophylaxis 03/25/2013  . Iron deficiency anemia 05/09/2011  . Charcot foot due to diabetes mellitus 05/09/2011  . Foot ulcer due to secondary DM 05/09/2011  . Type 1 diabetes mellitus with neurological manifestations, uncontrolled   . Hypertension   . Hypercholesteremia     CBC    Component Value Date/Time   WBC 6.3 09/05/2014 0505   RBC 3.52* 09/05/2014 0505   RBC 3.36* 06/13/2013 0450   HGB 9.8* 09/05/2014 0505   HCT 30.6* 09/05/2014 0505   PLT 524* 09/05/2014 0505   MCV 86.9 09/05/2014 0505   LYMPHSABS 0.4* 09/01/2014 1023   MONOABS 0.6 09/01/2014 1023   EOSABS 0.0 09/01/2014 1023   BASOSABS 0.0 09/01/2014 1023    CMP     Component Value Date/Time   NA 138 09/05/2014 0505   K 4.2 09/05/2014 0505   CL 118* 09/05/2014 0505   CO2 14* 09/05/2014 0505   GLUCOSE 183* 09/05/2014 0505   BUN 17 09/05/2014 0505   CREATININE 1.10* 09/05/2014 0505   CREATININE 1.11* 05/24/2011 1044   CALCIUM 7.4* 09/05/2014 0505   CALCIUM 8.5 09/01/2009 2241   PROT 4.9* 09/02/2014 0526   ALBUMIN 1.9* 09/02/2014 0526   AST 17 09/02/2014 0526   ALT 35 09/02/2014 0526   ALKPHOS 110 09/02/2014 0526    BILITOT 0.2* 09/02/2014 0526   GFRNONAA >60 09/05/2014 0505   GFRAA >60 09/05/2014 0505    Assessment and Plan  Status post transmetatarsal amputation of right foot Pt seen by ortho 7/5 with new post splint replacing cast to R foot. Aquesol Ag to wound R foot QOD. Non weight bearing R foot continues. Santyl to wound L foot daily. Weight bearing for transfers on L foot only in post  op shoe. F/w 2 weks ortho.  Hyperkalemia Unexplained, new. On 7/1 K+ was 5.2, with BUN 53/Cr1.4. On 7/5 K+ was 5.7 with BUN 50/Cr1.4. Pt's lisinopril was d/c and kayaxalate 30 gm given. On 7/6 K+ 5.6 with BUN 46/Cr 1.5. Kayaxalate 45 gm will be given and BMP tomorrow drawn STAT. Pt is being d/c tomorrow, will have nurse draw BMP on Friday, results to PCP. Pt is without sx.   All other problems are stable. Pt is being d/c to home with HH/OT/PT and nursing for her wound care tomorrow.  Hennie Duos, MD

## 2014-10-06 NOTE — Assessment & Plan Note (Addendum)
Unexplained, new. On 7/1 K+ was 5.2, with BUN 53/Cr1.4. On 7/5 K+ was 5.7 with BUN 50/Cr1.4. Pt's lisinopril was d/c and kayaxalate 30 gm given. On 7/6 K+ 5.6 with BUN 46/Cr 1.5. Kayaxalate 45 gm will be given and BMP tomorrow drawn STAT. Pt is being d/c tomorrow, will have nurse draw BMP on Friday, results to PCP. Pt is without sx.

## 2014-10-06 NOTE — Assessment & Plan Note (Signed)
Pt seen by ortho 7/5 with new post splint replacing cast to R foot. Aquesol Ag to wound R foot QOD. Non weight bearing R foot continues. Santyl to wound L foot daily. Weight bearing for transfers on L foot only in post op shoe. F/w 2 weks ortho.

## 2014-10-07 ENCOUNTER — Non-Acute Institutional Stay (SKILLED_NURSING_FACILITY): Payer: Medicare Other | Admitting: Internal Medicine

## 2014-10-07 ENCOUNTER — Encounter: Payer: Self-pay | Admitting: Internal Medicine

## 2014-10-07 DIAGNOSIS — I10 Essential (primary) hypertension: Secondary | ICD-10-CM

## 2014-10-07 DIAGNOSIS — E875 Hyperkalemia: Secondary | ICD-10-CM

## 2014-10-07 DIAGNOSIS — N183 Chronic kidney disease, stage 3 unspecified: Secondary | ICD-10-CM

## 2014-10-07 NOTE — Progress Notes (Signed)
Patient ID: [name], female   DOB: June 09, 1970, 44 y.o.   MRN: [id number] [id number] Name: [name]  Sex: female Age: 44 y.o. DOB: 1970/12/16  [place] #: [name] Facility/Room:511 Level Of Care: SNF Provider: Wille Celeste Emergency Contacts: Extended Emergency Contact Information Primary Emergency Contact: Sammie Bench, [place] of [place] Phone: [phone] Relation: Sister  Code Status: FULL  Allergies: Ciprofloxacin and Cleocin  Chief Complaint  Patient presents with  . Acute Visit    HPI: Patient is 44 y.o. 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  On July 5 was noted her potassium was 5.7 Creat was 1.4-on July 6 after receiving Kayexalate 30 g her potassium was 5.6 creatinine was 1.5-Dr. Sheppard Coil did assess her in gave her 45 g Kayexalate with lab to be drawn today that lab is pending.  In regards to her amputation this is stable she will need continued therapy and wound care as previously ordered by Dr. Sheppard Coil.  Currently she has no complaints she appears to be asymptomatic her vital signs remained stable she is looking forward to getting home.  Of note her lisinopril was discontinued by Dr. Sheppard Coil secondary to her hyperkalemia    Past Medical History  Diagnosis Date  . Diabetes mellitus   . Neuropathy in diabetes   . Hypertension   . Hypercholesteremia   . H/O seasonal allergies   . Anemia   . Left foot infection     Past Surgical History  Procedure Laterality Date  . Cesarean section    . Eye surgery    . Hemrrhoidectomy    . Peripherally inserted central catheter insertion    . I&d extremity Right 06/10/2014   Procedure: IRRIGATION AND DEBRIDEMENT Right Foot;  Surgeon: Newt Minion, MD;  Location: [place];  Service: Orthopedics;  Laterality: Right;  . Skin split graft Right 06/15/2014    Procedure: SPLIT THICKNESS SKIN GRAFT RIGHT FOOT;  Surgeon: Newt Minion, MD;  Location: [place];  Service: Orthopedics;  Laterality: Right;      Medication List       This list is accurate as of: 10/07/14 11:59 PM.  Always use your most recent med list.               albuterol 108 (90 BASE) MCG/ACT inhaler  Commonly known as:  PROVENTIL HFA;VENTOLIN HFA  Inhale 2 puffs into the lungs every 6 (six) hours as needed. S.O.B.     calcitRIOL 0.25 MCG capsule  Commonly known as:  ROCALTROL  Take 0.25 mcg by mouth daily. M,W,F     CALCIUM PO  Take 1 tablet by mouth daily.     ferrous sulfate 325 (65 FE) MG tablet  Take 325 mg by mouth 3 (three) times daily with meals.     fidaxomicin 200 MG Tabs tablet  Commonly known as:  DIFICID  Take 200 mg by mouth 2 (two) times daily.     folic acid 1 MG tablet  Commonly known as:  FOLVITE  Take 1 mg by mouth daily.     furosemide 20 MG tablet  Commonly known as:  LASIX  Take 20 mg by mouth daily as  needed.     lactobacillus acidophilus Tabs tablet  Take 1 tablet by mouth 3 (three) times daily.     pravastatin 40 MG tablet  Commonly known as:  PRAVACHOL  TAKE 1/2 TABLETS (20 MG TOTAL) BY MOUTH DAILY.     pyridoxine 100 MG tablet  Commonly known as:  B-6  Take 100 mg by mouth daily.     sodium bicarbonate 650 MG tablet  Take 650 mg by mouth 3 (three) times daily.     vitamin B-12 100 MCG tablet  Commonly known as:  CYANOCOBALAMIN  Take 200 mcg by mouth daily.        No orders of the defined types were placed in this encounter.    Immunization History  Administered Date(s) Administered  . Influenza Split 02/15/2011  . Pneumococcal Conjugate-13 02/15/2011  . Tdap 08/11/2012    History  Substance Use Topics  . Smoking status: Former Smoker     Quit date: 05/16/2008  . Smokeless tobacco: Never Used  . Alcohol Use: 0.5 oz/week    1 drink(s) per week     Comment: Occasional     Review of systems.  In general no complaints of fever or chills says she feels well.  Skin does not complain of rashes or itching.  Respirate no complaints of shortness breath or cough.  Cardiac does not complain of chest pain has continued trace lower extremity edema this does not appear to be changed. --Does not complain of palpitations   GI does not complain of abdominal pain nausea or vomiting.  Musculoskeletal is status post amputation as noted above will need therapy again at home  Neurologic does not complain of dizziness headache or syncopal-type feelings.     Physical Exam Temperature is 97.0 pulse 66 respirations 17 blood pressure 120/68 GENERAL APPEARANCE: Alert, conversant. No acute distress.  HEENT: Unremarkable. RESPIRATORY: Breathing is even, unlabored. Lung sounds are clear   CARDIOVASCULAR: Heart RRR no murmurs, rubs or gallops. trac peripheral edema.  GASTROINTESTINAL: Abdomen is soft, non-tender, not distended w/ normal bowel sounds.  NEUROLOGIC: Cranial nerves 2-12 grossly intact. Moves all extremities-- foot is currently covered with dry dressing.  Psych she is alert and oriented pleasant and appropriate  Patient Active Problem List   Diagnosis Date Noted  . Hyperkalemia 10/06/2014  . Bilateral lower extremity edema 10/04/2014  . Status post transmetatarsal amputation of right foot 09/26/2014  . Diabetic ulcer of left foot associated with type 1 diabetes mellitus 09/26/2014  . C. difficile diarrhea 09/26/2014  . Sepsis 09/01/2014  . Cellulitis 09/01/2014  . Nausea and vomiting 09/01/2014  . Diarrhea 09/01/2014  . AKI (acute kidney injury) 06/13/2014  . Cellulitis of right foot 06/02/2014  . Diabetic ulcer of right foot 06/02/2014  . DM type 1 causing renal disease 06/13/2013  . Osteomyelitis 06/12/2013  . CKD  (chronic kidney disease), stage III 06/12/2013  . UTI (urinary tract infection) 03/25/2013  . CAP (community acquired pneumonia) 03/25/2013  . Acute blood loss anemia 03/25/2013  . Acute renal failure 03/25/2013  . Acidosis 03/25/2013  . Leukocytosis, unspecified 03/25/2013  . DVT prophylaxis 03/25/2013  . Iron deficiency anemia 05/09/2011  . Charcot foot due to diabetes mellitus 05/09/2011  . Foot ulcer due to secondary DM 05/09/2011  . Type 1 diabetes mellitus with neurological manifestations, uncontrolled   . Hypertension   . Hypercholesteremia    labs.  10/05/2014.  Sodium 131 potassium 5.7 BUN 50 creatinine 1.4.  Again on July 6 creatinine was  1.5 potassium was 5.6.    CBC    Component Value Date/Time   WBC 6.3 09/05/2014 0505   RBC 3.52* 09/05/2014 0505   RBC 3.36* 06/13/2013 0450   HGB 9.8* 09/05/2014 0505   HCT 30.6* 09/05/2014 0505   PLT 524* 09/05/2014 0505   MCV 86.9 09/05/2014 0505   LYMPHSABS 0.4* 09/01/2014 1023   MONOABS 0.6 09/01/2014 1023   EOSABS 0.0 09/01/2014 1023   BASOSABS 0.0 09/01/2014 1023    CMP     Component Value Date/Time   NA 138 09/05/2014 0505   K 4.2 09/05/2014 0505   CL 118* 09/05/2014 0505   CO2 14* 09/05/2014 0505   GLUCOSE 183* 09/05/2014 0505   BUN 17 09/05/2014 0505   CREATININE 1.10* 09/05/2014 0505   CREATININE 1.11* 05/24/2011 1044   CALCIUM 7.4* 09/05/2014 0505   CALCIUM 8.5 09/01/2009 2241   PROT 4.9* 09/02/2014 0526   ALBUMIN 1.9* 09/02/2014 0526   AST 17 09/02/2014 0526   ALT 35 09/02/2014 0526   ALKPHOS 110 09/02/2014 0526   BILITOT 0.2* 09/02/2014 0526   GFRNONAA >60 09/05/2014 0505   GFRAA >60 09/05/2014 0505    Assessment and Plan Hyperkalemia-this is apparently relatively new diagnosis although speaking with patient she says she has been followed nephrology in the past--and I do note she is on sodium bicarbonate-I suspect electrolyte abnormalities with her chronic renal disease is not  unexpected--patient is quite adamant she needs to go home this evening however we have not obtained the laboratory results from the lab yet-would like to make sure this is stable before she goes home-clinically she appears to be stable but this will have to be watched closely in Dr. Sheppard Coil has ordered follow-up when she's discharged so these labs are evaluated by her primary care provider via home health.  History of transmetatarsal amputation right foot-this will need follow-up by wound care in the home health setting as well as orthopedic follow-up which has been arranged.  Hypertension this appears to be stable despite being off the lisinopril recent blood pressures 120/68-125/65-this will warrant follow up by her primary care provider    Addendum.  Later this evening labs were obtained which show potassium is still elevated although trending down at 5.4 BUN is 35 creatinine 1.4 sodium was 133.  Per discussion with nursing staff patient is quite adamant she needs to go home this was discussed with Dr. Eulas Post who is on-call-we'll discharge patient but she will need again labs drawn by home health tomorrow with primary care provider notified of her results  [phone] note greater than 25 minutes spent assessing patient-discussing her status with nursing staff-reviewing her chart-and consulting with on call physician after the lab results became available later this evening.      Donis Pinder C,

## 2014-10-12 ENCOUNTER — Encounter (HOSPITAL_BASED_OUTPATIENT_CLINIC_OR_DEPARTMENT_OTHER): Payer: Self-pay | Admitting: Emergency Medicine

## 2014-10-12 ENCOUNTER — Emergency Department (HOSPITAL_BASED_OUTPATIENT_CLINIC_OR_DEPARTMENT_OTHER): Payer: Medicare Other

## 2014-10-12 ENCOUNTER — Inpatient Hospital Stay (HOSPITAL_BASED_OUTPATIENT_CLINIC_OR_DEPARTMENT_OTHER)
Admission: EM | Admit: 2014-10-12 | Discharge: 2014-10-18 | DRG: 638 | Disposition: A | Discharge status: Other Acute Inpatient Hospital | Payer: Medicare Other | Attending: Internal Medicine | Admitting: Internal Medicine

## 2014-10-12 ENCOUNTER — Encounter (HOSPITAL_COMMUNITY): Payer: Medicaid Other

## 2014-10-12 DIAGNOSIS — A0472 Enterocolitis due to Clostridium difficile, not specified as recurrent: Secondary | ICD-10-CM | POA: Diagnosis present

## 2014-10-12 DIAGNOSIS — R7881 Bacteremia: Secondary | ICD-10-CM | POA: Diagnosis present

## 2014-10-12 DIAGNOSIS — A047 Enterocolitis due to Clostridium difficile: Secondary | ICD-10-CM | POA: Diagnosis present

## 2014-10-12 DIAGNOSIS — E1041 Type 1 diabetes mellitus with diabetic mononeuropathy: Secondary | ICD-10-CM | POA: Diagnosis not present

## 2014-10-12 DIAGNOSIS — E875 Hyperkalemia: Secondary | ICD-10-CM | POA: Diagnosis present

## 2014-10-12 DIAGNOSIS — N183 Chronic kidney disease, stage 3 unspecified: Secondary | ICD-10-CM | POA: Diagnosis present

## 2014-10-12 DIAGNOSIS — B9562 Methicillin resistant Staphylococcus aureus infection as the cause of diseases classified elsewhere: Secondary | ICD-10-CM | POA: Diagnosis present

## 2014-10-12 DIAGNOSIS — E11628 Type 2 diabetes mellitus with other skin complications: Secondary | ICD-10-CM | POA: Diagnosis present

## 2014-10-12 DIAGNOSIS — D509 Iron deficiency anemia, unspecified: Secondary | ICD-10-CM | POA: Diagnosis present

## 2014-10-12 DIAGNOSIS — E440 Moderate protein-calorie malnutrition: Secondary | ICD-10-CM | POA: Diagnosis present

## 2014-10-12 DIAGNOSIS — Z794 Long term (current) use of insulin: Secondary | ICD-10-CM | POA: Diagnosis not present

## 2014-10-12 DIAGNOSIS — I824Z2 Acute embolism and thrombosis of unspecified deep veins of left distal lower extremity: Secondary | ICD-10-CM | POA: Diagnosis present

## 2014-10-12 DIAGNOSIS — Z9641 Presence of insulin pump (external) (internal): Secondary | ICD-10-CM | POA: Diagnosis present

## 2014-10-12 DIAGNOSIS — B958 Unspecified staphylococcus as the cause of diseases classified elsewhere: Secondary | ICD-10-CM | POA: Diagnosis not present

## 2014-10-12 DIAGNOSIS — L97529 Non-pressure chronic ulcer of other part of left foot with unspecified severity: Secondary | ICD-10-CM | POA: Diagnosis present

## 2014-10-12 DIAGNOSIS — E7800 Pure hypercholesterolemia, unspecified: Secondary | ICD-10-CM | POA: Diagnosis present

## 2014-10-12 DIAGNOSIS — E785 Hyperlipidemia, unspecified: Secondary | ICD-10-CM | POA: Diagnosis present

## 2014-10-12 DIAGNOSIS — L97429 Non-pressure chronic ulcer of left heel and midfoot with unspecified severity: Secondary | ICD-10-CM | POA: Diagnosis not present

## 2014-10-12 DIAGNOSIS — E10621 Type 1 diabetes mellitus with foot ulcer: Principal | ICD-10-CM | POA: Diagnosis present

## 2014-10-12 DIAGNOSIS — R509 Fever, unspecified: Secondary | ICD-10-CM | POA: Diagnosis not present

## 2014-10-12 DIAGNOSIS — E1065 Type 1 diabetes mellitus with hyperglycemia: Secondary | ICD-10-CM | POA: Diagnosis present

## 2014-10-12 DIAGNOSIS — IMO0002 Reserved for concepts with insufficient information to code with codable children: Secondary | ICD-10-CM | POA: Diagnosis present

## 2014-10-12 DIAGNOSIS — L089 Local infection of the skin and subcutaneous tissue, unspecified: Secondary | ICD-10-CM

## 2014-10-12 DIAGNOSIS — E1022 Type 1 diabetes mellitus with diabetic chronic kidney disease: Secondary | ICD-10-CM | POA: Diagnosis not present

## 2014-10-12 DIAGNOSIS — Z681 Body mass index (BMI) 19 or less, adult: Secondary | ICD-10-CM

## 2014-10-12 DIAGNOSIS — I82409 Acute embolism and thrombosis of unspecified deep veins of unspecified lower extremity: Secondary | ICD-10-CM | POA: Diagnosis not present

## 2014-10-12 DIAGNOSIS — L03116 Cellulitis of left lower limb: Secondary | ICD-10-CM | POA: Diagnosis present

## 2014-10-12 DIAGNOSIS — D72829 Elevated white blood cell count, unspecified: Secondary | ICD-10-CM

## 2014-10-12 DIAGNOSIS — Z9114 Patient's other noncompliance with medication regimen: Secondary | ICD-10-CM | POA: Diagnosis present

## 2014-10-12 DIAGNOSIS — I82402 Acute embolism and thrombosis of unspecified deep veins of left lower extremity: Secondary | ICD-10-CM | POA: Diagnosis present

## 2014-10-12 DIAGNOSIS — Z87891 Personal history of nicotine dependence: Secondary | ICD-10-CM | POA: Diagnosis not present

## 2014-10-12 DIAGNOSIS — E10649 Type 1 diabetes mellitus with hypoglycemia without coma: Secondary | ICD-10-CM | POA: Diagnosis present

## 2014-10-12 DIAGNOSIS — E46 Unspecified protein-calorie malnutrition: Secondary | ICD-10-CM | POA: Diagnosis present

## 2014-10-12 DIAGNOSIS — E11621 Type 2 diabetes mellitus with foot ulcer: Secondary | ICD-10-CM | POA: Diagnosis present

## 2014-10-12 DIAGNOSIS — E119 Type 2 diabetes mellitus without complications: Secondary | ICD-10-CM | POA: Diagnosis not present

## 2014-10-12 DIAGNOSIS — R52 Pain, unspecified: Secondary | ICD-10-CM

## 2014-10-12 DIAGNOSIS — L97509 Non-pressure chronic ulcer of other part of unspecified foot with unspecified severity: Secondary | ICD-10-CM

## 2014-10-12 DIAGNOSIS — M79672 Pain in left foot: Secondary | ICD-10-CM | POA: Diagnosis not present

## 2014-10-12 DIAGNOSIS — I129 Hypertensive chronic kidney disease with stage 1 through stage 4 chronic kidney disease, or unspecified chronic kidney disease: Secondary | ICD-10-CM | POA: Diagnosis present

## 2014-10-12 DIAGNOSIS — D649 Anemia, unspecified: Secondary | ICD-10-CM | POA: Diagnosis present

## 2014-10-12 DIAGNOSIS — L0390 Cellulitis, unspecified: Secondary | ICD-10-CM

## 2014-10-12 DIAGNOSIS — E1049 Type 1 diabetes mellitus with other diabetic neurological complication: Secondary | ICD-10-CM | POA: Diagnosis present

## 2014-10-12 DIAGNOSIS — Z86718 Personal history of other venous thrombosis and embolism: Secondary | ICD-10-CM | POA: Insufficient documentation

## 2014-10-12 DIAGNOSIS — Z9111 Patient's noncompliance with dietary regimen: Secondary | ICD-10-CM | POA: Diagnosis present

## 2014-10-12 DIAGNOSIS — I8011 Phlebitis and thrombophlebitis of right femoral vein: Secondary | ICD-10-CM | POA: Diagnosis not present

## 2014-10-12 DIAGNOSIS — Z89431 Acquired absence of right foot: Secondary | ICD-10-CM

## 2014-10-12 LAB — CBC WITH DIFFERENTIAL/PLATELET
Basophils Absolute: 0 10*3/uL (ref 0.0–0.1)
Basophils Relative: 0 % (ref 0–1)
EOS ABS: 0 10*3/uL (ref 0.0–0.7)
EOS PCT: 1 % (ref 0–5)
HCT: 36.9 % (ref 36.0–46.0)
HEMOGLOBIN: 11.9 g/dL — AB (ref 12.0–15.0)
LYMPHS ABS: 1 10*3/uL (ref 0.7–4.0)
LYMPHS PCT: 18 % (ref 12–46)
MCH: 28.5 pg (ref 26.0–34.0)
MCHC: 32.2 g/dL (ref 30.0–36.0)
MCV: 88.5 fL (ref 78.0–100.0)
MONOS PCT: 8 % (ref 3–12)
Monocytes Absolute: 0.5 10*3/uL (ref 0.1–1.0)
NEUTROS ABS: 4.2 10*3/uL (ref 1.7–7.7)
Neutrophils Relative %: 73 % (ref 43–77)
PLATELETS: 278 10*3/uL (ref 150–400)
RBC: 4.17 MIL/uL (ref 3.87–5.11)
RDW: 13.6 % (ref 11.5–15.5)
WBC: 5.8 10*3/uL (ref 4.0–10.5)

## 2014-10-12 LAB — COMPREHENSIVE METABOLIC PANEL
ALBUMIN: 3 g/dL — AB (ref 3.5–5.0)
ALT: 94 U/L — ABNORMAL HIGH (ref 14–54)
AST: 37 U/L (ref 15–41)
Alkaline Phosphatase: 146 U/L — ABNORMAL HIGH (ref 38–126)
Anion gap: 8 (ref 5–15)
BUN: 40 mg/dL — AB (ref 6–20)
CALCIUM: 8.1 mg/dL — AB (ref 8.9–10.3)
CHLORIDE: 103 mmol/L (ref 101–111)
CO2: 22 mmol/L (ref 22–32)
CREATININE: 1.69 mg/dL — AB (ref 0.44–1.00)
GFR calc Af Amer: 42 mL/min — ABNORMAL LOW (ref >60)
GFR, EST NON AFRICAN AMERICAN: 36 mL/min — AB (ref >60)
Glucose, Bld: 324 mg/dL — ABNORMAL HIGH (ref 65–99)
Potassium: 5 mmol/L (ref 3.5–5.1)
Sodium: 133 mmol/L — ABNORMAL LOW (ref 135–145)
Total Bilirubin: 0.5 mg/dL (ref 0.3–1.2)
Total Protein: 6.6 g/dL (ref 6.5–8.1)

## 2014-10-12 LAB — CBG MONITORING, ED: Glucose-Capillary: 141 mg/dL — ABNORMAL HIGH (ref 65–99)

## 2014-10-12 MED ORDER — SODIUM CHLORIDE 0.9 % IV SOLN: INTRAVENOUS | Status: DC

## 2014-10-12 MED ORDER — MORPHINE SULFATE 4 MG/ML IJ SOLN
4.0000 mg | Freq: Once | INTRAMUSCULAR | Status: AC
  Administered 2014-10-12: 4 mg via INTRAVENOUS
  Filled 2014-10-12: qty 1

## 2014-10-12 MED ORDER — VANCOMYCIN HCL IN DEXTROSE 1-5 GM/200ML-% IV SOLN
1000.0000 mg | Freq: Once | INTRAVENOUS | Status: AC
  Administered 2014-10-12: 1000 mg via INTRAVENOUS
  Filled 2014-10-12: qty 200

## 2014-10-12 MED ORDER — INSULIN PUMP
Freq: Three times a day (TID) | SUBCUTANEOUS | Status: DC
  Administered 2014-10-13 – 2014-10-14 (×5): via SUBCUTANEOUS
  Administered 2014-10-14 – 2014-10-15 (×4): 1 via SUBCUTANEOUS
  Administered 2014-10-15: 02:00:00 via SUBCUTANEOUS
  Administered 2014-10-15: 1 via SUBCUTANEOUS
  Administered 2014-10-16 (×2): via SUBCUTANEOUS
  Administered 2014-10-16: 3.7 via SUBCUTANEOUS
  Administered 2014-10-16: 02:00:00 via SUBCUTANEOUS
  Administered 2014-10-17: 0.5 via SUBCUTANEOUS
  Administered 2014-10-17: 5.25 via SUBCUTANEOUS
  Administered 2014-10-18: 3.25 via SUBCUTANEOUS
  Administered 2014-10-18 (×2): via SUBCUTANEOUS
  Filled 2014-10-12: qty 1

## 2014-10-12 NOTE — ED Notes (Signed)
Pt ambulated to BR with rollator.

## 2014-10-12 NOTE — ED Notes (Signed)
Patient denies pain and is resting comfortably.  

## 2014-10-12 NOTE — ED Notes (Signed)
Report given to jamie, rn with [facility]

## 2014-10-12 NOTE — ED Notes (Signed)
Patient states that she has seen a doctor since dec for a left foot wound. The patient reports that 2 days ago she started to have chills aches and warmth to her left foot. The patient has reddness and swelling to her left foot.

## 2014-10-12 NOTE — ED Notes (Signed)
Report called to [place] at [facility].

## 2014-10-12 NOTE — ED Provider Notes (Signed)
CSN: [id number]     Arrival date & time 10/12/14  1614 History   First MD Initiated Contact with Patient 10/12/14 1619     Chief Complaint  Patient presents with  . Foot Pain     (Consider location/radiation/quality/duration/timing/severity/associated sxs/prior Treatment) HPI Comments: Patient is a 44 year old female with history of type 1 diabetes. She was recently discharged to rehabilitation from [facility] where she was admitted for a diabetic foot ulcer and required amputation of her distal right foot. She noticed today and ulcer to the bottom of the left foot. She reports feeling fevered and chilled prior to coming here.  Patient is a 44 y.o. female presenting with lower extremity pain. The history is provided by the patient.  Foot Pain This is a recurrent problem. The current episode started yesterday. The problem occurs constantly. The problem has been rapidly worsening. Nothing aggravates the symptoms. Nothing relieves the symptoms. She has tried nothing for the symptoms. The treatment provided no relief.    Past Medical History  Diagnosis Date  . Diabetes mellitus   . Neuropathy in diabetes   . Hypertension   . Hypercholesteremia   . H/O seasonal allergies   . Anemia   . Left foot infection    Past Surgical History  Procedure Laterality Date  . Cesarean section    . Eye surgery    . Hemrrhoidectomy    . Peripherally inserted central catheter insertion    . I&d extremity Right 06/10/2014    Procedure: IRRIGATION AND DEBRIDEMENT Right Foot;  Surgeon: Newt Minion, MD;  Location: [place];  Service: Orthopedics;  Laterality: Right;  . Skin split graft Right 06/15/2014    Procedure: SPLIT THICKNESS SKIN GRAFT RIGHT FOOT;  Surgeon: Newt Minion, MD;  Location: [place];  Service: Orthopedics;  Laterality: Right;   History reviewed. No pertinent family history. History  Substance Use Topics  . Smoking status: Former Smoker    Quit date: 05/16/2008  . Smokeless tobacco: Never  Used  . Alcohol Use: 0.5 oz/week    1 drink(s) per week     Comment: Occasional   OB History    No data available     Review of Systems  All other systems reviewed and are negative.     Allergies  Ciprofloxacin and Cleocin  Home Medications   Prior to Admission medications   Medication Sig Start Date End Date Taking? Authorizing Provider  albuterol (PROVENTIL HFA;VENTOLIN HFA) 108 (90 BASE) MCG/ACT inhaler Inhale 2 puffs into the lungs every 6 (six) hours as needed. S.O.B.    Historical Provider, MD  calcitRIOL (ROCALTROL) 0.25 MCG capsule Take 0.25 mcg by mouth daily. M,W,F    Historical Provider, MD  CALCIUM PO Take 1 tablet by mouth daily.    Historical Provider, MD  ferrous sulfate 325 (65 FE) MG tablet Take 325 mg by mouth 3 (three) times daily with meals.  11/23/13   Historical Provider, MD  fidaxomicin (DIFICID) 200 MG TABS tablet Take 200 mg by mouth 2 (two) times daily.    Historical Provider, MD  folic acid (FOLVITE) 1 MG tablet Take 1 mg by mouth daily.    Historical Provider, MD  furosemide (LASIX) 20 MG tablet Take 20 mg by mouth daily as needed.    Historical Provider, MD  lactobacillus acidophilus (BACID) TABS tablet Take 1 tablet by mouth 3 (three) times daily.    Historical Provider, MD  pravastatin (PRAVACHOL) 40 MG tablet TAKE 1/2 TABLETS (20 MG TOTAL) BY  MOUTH DAILY. 07/26/14   Elayne Snare, MD  pyridoxine (B-6) 100 MG tablet Take 100 mg by mouth daily.    Historical Provider, MD  sodium bicarbonate 650 MG tablet Take 650 mg by mouth 3 (three) times daily.    Historical Provider, MD  vitamin B-12 (CYANOCOBALAMIN) 100 MCG tablet Take 200 mcg by mouth daily.     Historical Provider, MD   BP 139/74 mmHg  Pulse 90  Temp(Src) 99.1 F (37.3 C) (Oral)  Resp 18  Ht 5\' 7"  (1.702 m)  Wt 135 lb (61.236 kg)  BMI 21.14 kg/m2  SpO2 100% Physical Exam  Constitutional: She is oriented to person, place, and time. She appears well-developed and well-nourished. No distress.    HENT:  Head: Normocephalic and atraumatic.  Neck: Normal range of motion. Neck supple.  Cardiovascular: Normal rate and regular rhythm.  Exam reveals no gallop and no friction rub.   No murmur heard. Pulmonary/Chest: Effort normal and breath sounds normal. No respiratory distress. She has no wheezes.  Abdominal: Soft. Bowel sounds are normal. She exhibits no distension. There is no tenderness.  Musculoskeletal: Normal range of motion.  There is a 1 cm round ulcer to the bottom of the left foot along the lateral aspect. There is significant erythema surrounding it.  Neurological: She is alert and oriented to person, place, and time.  Skin: Skin is warm and dry. She is not diaphoretic.  Nursing note and vitals reviewed.        ED Course  Procedures (including critical care time) Labs Review Labs Reviewed  CULTURE, BLOOD (ROUTINE X 2)  CULTURE, BLOOD (ROUTINE X 2)  COMPREHENSIVE METABOLIC PANEL  CBC WITH DIFFERENTIAL/PLATELET    Imaging Review No results found.   EKG Interpretation None      MDM   Final diagnoses:  Ulcer of foot due to diabetes    Patient presents here with a diabetic foot ulcer on her left foot. She recently had an amputation of part of her right foot due to similar issues. She reports chills and Reiter's at home yesterday, however here is afebrile with no white count. Her x-rays do not reveal osteomyelitis. I did obtain blood cultures prior to administering vancomycin. I feel as though she will require admission for repeat doses of IV antibiotics and have discussed this with Dr. Humphrey Rolls who is willing to accept.    Veryl Speak, MD 10/12/14 [id number]

## 2014-10-13 ENCOUNTER — Inpatient Hospital Stay (HOSPITAL_COMMUNITY): Payer: Medicare Other

## 2014-10-13 ENCOUNTER — Encounter (HOSPITAL_COMMUNITY): Payer: Medicaid Other

## 2014-10-13 ENCOUNTER — Encounter (HOSPITAL_COMMUNITY): Payer: Self-pay | Admitting: Internal Medicine

## 2014-10-13 DIAGNOSIS — L03116 Cellulitis of left lower limb: Secondary | ICD-10-CM

## 2014-10-13 DIAGNOSIS — D649 Anemia, unspecified: Secondary | ICD-10-CM | POA: Diagnosis present

## 2014-10-13 DIAGNOSIS — L089 Local infection of the skin and subcutaneous tissue, unspecified: Secondary | ICD-10-CM

## 2014-10-13 DIAGNOSIS — I82409 Acute embolism and thrombosis of unspecified deep veins of unspecified lower extremity: Secondary | ICD-10-CM

## 2014-10-13 DIAGNOSIS — E1065 Type 1 diabetes mellitus with hyperglycemia: Secondary | ICD-10-CM | POA: Diagnosis present

## 2014-10-13 DIAGNOSIS — E11628 Type 2 diabetes mellitus with other skin complications: Secondary | ICD-10-CM | POA: Diagnosis present

## 2014-10-13 LAB — GLUCOSE, CAPILLARY
GLUCOSE-CAPILLARY: 103 mg/dL — AB (ref 65–99)
GLUCOSE-CAPILLARY: 231 mg/dL — AB (ref 65–99)
Glucose-Capillary: 72 mg/dL (ref 65–99)
Glucose-Capillary: 88 mg/dL (ref 65–99)
Glucose-Capillary: 358 mg/dL — ABNORMAL HIGH (ref 65–99)
Glucose-Capillary: 250 mg/dL — ABNORMAL HIGH (ref 65–99)

## 2014-10-13 LAB — COMPREHENSIVE METABOLIC PANEL
ALBUMIN: 2.5 g/dL — AB (ref 3.5–5.0)
ALT: 69 U/L — ABNORMAL HIGH (ref 14–54)
ANION GAP: 10 (ref 5–15)
AST: 25 U/L (ref 15–41)
Alkaline Phosphatase: 125 U/L (ref 38–126)
BUN: 37 mg/dL — ABNORMAL HIGH (ref 6–20)
CHLORIDE: 105 mmol/L (ref 101–111)
CO2: 23 mmol/L (ref 22–32)
CREATININE: 1.56 mg/dL — AB (ref 0.44–1.00)
Calcium: 8.3 mg/dL — ABNORMAL LOW (ref 8.9–10.3)
GFR calc Af Amer: 46 mL/min — ABNORMAL LOW (ref >60)
GFR calc non Af Amer: 40 mL/min — ABNORMAL LOW (ref >60)
Glucose, Bld: 62 mg/dL — ABNORMAL LOW (ref 65–99)
Potassium: 4.7 mmol/L (ref 3.5–5.1)
Sodium: 138 mmol/L (ref 135–145)
Total Bilirubin: 0.3 mg/dL (ref 0.3–1.2)
Total Protein: 5.9 g/dL — ABNORMAL LOW (ref 6.5–8.1)

## 2014-10-13 LAB — MRSA PCR SCREENING: MRSA by PCR: POSITIVE — AB

## 2014-10-13 LAB — CBC WITH DIFFERENTIAL/PLATELET
BASOS ABS: 0 10*3/uL (ref 0.0–0.1)
Basophils Relative: 0 % (ref 0–1)
EOS ABS: 0.1 10*3/uL (ref 0.0–0.7)
EOS PCT: 1 % (ref 0–5)
HCT: 34.4 % — ABNORMAL LOW (ref 36.0–46.0)
HEMOGLOBIN: 11.1 g/dL — AB (ref 12.0–15.0)
Lymphocytes Relative: 32 % (ref 12–46)
Lymphs Abs: 1.5 10*3/uL (ref 0.7–4.0)
MCH: 28.7 pg (ref 26.0–34.0)
MCHC: 32.3 g/dL (ref 30.0–36.0)
MCV: 88.9 fL (ref 78.0–100.0)
MONO ABS: 0.7 10*3/uL (ref 0.1–1.0)
Monocytes Relative: 14 % — ABNORMAL HIGH (ref 3–12)
NEUTROS ABS: 2.4 10*3/uL (ref 1.7–7.7)
NEUTROS PCT: 53 % (ref 43–77)
PLATELETS: 274 10*3/uL (ref 150–400)
RBC: 3.87 MIL/uL (ref 3.87–5.11)
RDW: 14 % (ref 11.5–15.5)
WBC: 4.7 10*3/uL (ref 4.0–10.5)

## 2014-10-13 LAB — SEDIMENTATION RATE: Sed Rate: 53 mm/hr — ABNORMAL HIGH (ref 0–22)

## 2014-10-13 MED ORDER — ENOXAPARIN SODIUM 40 MG/0.4ML ~~LOC~~ SOLN
40.0000 mg | SUBCUTANEOUS | Status: DC
  Administered 2014-10-13 – 2014-10-15 (×3): 40 mg via SUBCUTANEOUS
  Filled 2014-10-13 (×3): qty 0.4

## 2014-10-13 MED ORDER — SODIUM CHLORIDE 0.9 % IJ SOLN
3.0000 mL | Freq: Two times a day (BID) | INTRAMUSCULAR | Status: DC
  Administered 2014-10-13 – 2014-10-18 (×3): 3 mL via INTRAVENOUS

## 2014-10-13 MED ORDER — ONDANSETRON HCL 4 MG/2ML IJ SOLN: 4.0000 mg | Freq: Four times a day (QID) | INTRAMUSCULAR | Status: DC | PRN

## 2014-10-13 MED ORDER — ONDANSETRON HCL 4 MG PO TABS: 4.0000 mg | ORAL_TABLET | Freq: Four times a day (QID) | ORAL | Status: DC | PRN

## 2014-10-13 MED ORDER — VITAMIN B-6 100 MG PO TABS
100.0000 mg | ORAL_TABLET | Freq: Every day | ORAL | Status: DC
  Administered 2014-10-13 – 2014-10-18 (×6): 100 mg via ORAL
  Filled 2014-10-13 (×6): qty 1

## 2014-10-13 MED ORDER — FERROUS SULFATE 325 (65 FE) MG PO TABS
325.0000 mg | ORAL_TABLET | Freq: Three times a day (TID) | ORAL | Status: DC
  Administered 2014-10-13 – 2014-10-18 (×18): 325 mg via ORAL
  Filled 2014-10-13 (×20): qty 1

## 2014-10-13 MED ORDER — CALCITRIOL 0.25 MCG PO CAPS
0.2500 ug | ORAL_CAPSULE | ORAL | Status: DC
  Administered 2014-10-13 – 2014-10-18 (×3): 0.25 ug via ORAL
  Filled 2014-10-13 (×3): qty 1

## 2014-10-13 MED ORDER — PRAVASTATIN SODIUM 20 MG PO TABS
20.0000 mg | ORAL_TABLET | Freq: Every day | ORAL | Status: DC
  Administered 2014-10-13 – 2014-10-18 (×6): 20 mg via ORAL
  Filled 2014-10-13 (×6): qty 1

## 2014-10-13 MED ORDER — VITAMIN B-12 100 MCG PO TABS
200.0000 ug | ORAL_TABLET | Freq: Every day | ORAL | Status: DC
  Administered 2014-10-13 – 2014-10-18 (×6): 200 ug via ORAL
  Filled 2014-10-13 (×6): qty 2

## 2014-10-13 MED ORDER — VANCOMYCIN HCL IN DEXTROSE 750-5 MG/150ML-% IV SOLN
750.0000 mg | INTRAVENOUS | Status: DC
  Administered 2014-10-13: 750 mg via INTRAVENOUS
  Filled 2014-10-13 (×2): qty 150

## 2014-10-13 MED ORDER — ACETAMINOPHEN 650 MG RE SUPP: 650.0000 mg | Freq: Four times a day (QID) | RECTAL | Status: DC | PRN

## 2014-10-13 MED ORDER — ALBUTEROL SULFATE HFA 108 (90 BASE) MCG/ACT IN AERS: 2.0000 | INHALATION_SPRAY | Freq: Four times a day (QID) | RESPIRATORY_TRACT | Status: DC | PRN

## 2014-10-13 MED ORDER — DEXTROSE 5 % IV SOLN
2.0000 g | Freq: Two times a day (BID) | INTRAVENOUS | Status: DC
  Administered 2014-10-13 – 2014-10-14 (×4): 2 g via INTRAVENOUS
  Filled 2014-10-13 (×5): qty 2

## 2014-10-13 MED ORDER — ACETAMINOPHEN 325 MG PO TABS: 650.0000 mg | ORAL_TABLET | Freq: Four times a day (QID) | ORAL | Status: DC | PRN

## 2014-10-13 MED ORDER — FOLIC ACID 1 MG PO TABS
1.0000 mg | ORAL_TABLET | Freq: Every day | ORAL | Status: DC
  Administered 2014-10-13 – 2014-10-18 (×6): 1 mg via ORAL
  Filled 2014-10-13 (×6): qty 1

## 2014-10-13 MED ORDER — MORPHINE SULFATE 2 MG/ML IJ SOLN
1.0000 mg | INTRAMUSCULAR | Status: DC | PRN
  Administered 2014-10-13 – 2014-10-15 (×5): 1 mg via INTRAVENOUS
  Filled 2014-10-13 (×5): qty 1

## 2014-10-13 MED ORDER — SODIUM BICARBONATE 650 MG PO TABS
650.0000 mg | ORAL_TABLET | Freq: Three times a day (TID) | ORAL | Status: DC
  Administered 2014-10-13 – 2014-10-17 (×13): 650 mg via ORAL
  Filled 2014-10-13 (×18): qty 1

## 2014-10-13 MED ORDER — ALBUTEROL SULFATE (2.5 MG/3ML) 0.083% IN NEBU: 2.5000 mg | INHALATION_SOLUTION | Freq: Four times a day (QID) | RESPIRATORY_TRACT | Status: DC | PRN

## 2014-10-13 NOTE — Consult Note (Signed)
WOC wound consult note Reason for Consult:Diabetic ulcer to left plantar foot with development of pain and blistering to left lateral foot.  Patient was recently here and transferred to [facility] and had right transmetatarsal amputation. Has eschar to a lesion on right lateral foot. Patient states she was in rehab until last week.  Wound type:Neuropathic ulcers Pressure Ulcer POA: N/A Measurement:LEft lateral foot blistering- intact 1 cm x 1.5 cm Left plantar foot 1 cm x 0.5 cm x 0.3 cm opening.  Callous present to periwound circumferentially. No erythema noted.  Patient states pain started in one day.  Right foot, resolving surgical incision from transmetatarsal amputation.   Right lateral foot 1 cm x 1 cm eschar Wound bed: Ruddy red left plantar foot Drainage (amount, consistency, odor) None noted.   Periwound:Callous to left plantar foot.  Dressing procedure/placement/frequency:Cleanse ulcers to left plantar and lateral foot.  Cover both with small piece Aquacel AG.  Wrap with kerlix and secure with tape.  Change daily.  Cleanse suture line right foot, lesion right lateral foot with NS and apply strip Aquacel AG.  Wrap with kerlix and secure with tape.  Change daily.   Will not follow at this time.  Please re-consult if needed.  Domenic Moras RN BSN [place] Pager [phone]

## 2014-10-13 NOTE — Progress Notes (Signed)
[facility] PROGRESS NOTE  [name] [id number] DOB: [birth year] DOA: 10/12/2014 PCP: Hayden Rasmussen., MD   Brief narrative 44 year old female with history of type 1 diabetes mellitus on insulin pump (uncontrolled), chronic kidney disease stage III, hyperlipidemia, hypertension, hospitalized one month back for right diabetic foot ulcer and was transferred to [facility] (as per her request) and underwent ray amputation of the right foot and went to rehab from  where she was discharged 2 days back. After returning home patient developed fever with chills and pain in her left leg. She also noticed blister over the left foot. X-ray done in the ED was negative for ostium myelitis of the left foot. Vision had low-grade fever on presentation and admitted for cellulitis of the left foot with concern for osteomyelitis.   Assessment/Plan: Left lower leg cellulitis with diabetic foot ulcer over the left plantar surface On empiric vancomycin and ceftazidime. MRI of the left foot ordered (could not be done earlier as patient had staples in her right foot that need to be removed). Pain control with when necessary morphine. Wound care consulted. Follow blood cultures. Continue supportive care.  seen by Dr Lorin Mercy during last hospitalization. Will consult [facility] if MRI positive.   Type 1 diabetes mellitus On insulin pump. Low blood glucose this morning as patient reports she has not eaten anything since yesterday. Diabetes otherwise uncontrolled with A1c of 9.7.  Left lower leg and calf tenderness Check Doppler lower extremity to rule out DVT.  Chronic kidney disease stage III Renal function at baseline  Dyslipidemia Continue statin  DVT prophylaxis: Subcutaneous   Code Status: Full code  Family Communication: None at bedside  Disposition Plan: inpatient  Consultants:  none  Procedures:  MRI left foot  Antibiotics: IV vancomycin and ceftazidime  7/13  HPI/Subjective: Admission H&P reviewed. Patient complains of pain in her left leg   Objective: Filed Vitals:   10/13/14 0616  BP: 138/71  Pulse: 81  Temp: 98.8 F (37.1 C)  Resp: 16   No intake or output data in the 24 hours ending 10/13/14 1351 Filed Weights   10/12/14 1622 10/12/14 2243  Weight: 61.236 kg (135 lb) 55.6 kg (122 lb 9.2 oz)    Exam:   General:  Middle aged thin built female in no acute distress  HEENT: No pallor, moist oral mucosa  Chest: Clear to auscultation bilaterally, no added sounds  CVS: Normal sinus: Murmurs or gallop  GI: Soft, nondistended, nontender, bowel sounds present Musculoskeletal: Warm, no edema, tender to palpation over left calf, no swelling, ulceration with blister over left plantar area, no discharge, tender to pressure. Clean staples over right foot amputation  CNS: Alert and oriented  Data Reviewed: Basic Metabolic Panel:  Recent Labs Lab 10/12/14 1632 10/13/14 0517  NA 133* 138  K 5.0 4.7  CL 103 105  CO2 22 23  GLUCOSE 324* 62*  BUN 40* 37*  CREATININE 1.69* 1.56*  CALCIUM 8.1* 8.3*   Liver Function Tests:  Recent Labs Lab 10/12/14 1632 10/13/14 0517  AST 37 25  ALT 94* 69*  ALKPHOS 146* 125  BILITOT 0.5 0.3  PROT 6.6 5.9*  ALBUMIN 3.0* 2.5*   No results for input(s): LIPASE, AMYLASE in the last 168 hours. No results for input(s): AMMONIA in the last 168 hours. CBC:  Recent Labs Lab 10/12/14 1632 10/13/14 0517  WBC 5.8 4.7  NEUTROABS 4.2 2.4  HGB 11.9* 11.1*  HCT 36.9 34.4*  MCV 88.5 88.9  PLT 278  274   Cardiac Enzymes: No results for input(s): CKTOTAL, CKMB, CKMBINDEX, TROPONINI in the last 168 hours. BNP (last 3 results) No results for input(s): BNP in the last 8760 hours.  ProBNP (last 3 results) No results for input(s): PROBNP in the last 8760 hours.  CBG:  Recent Labs Lab 10/12/14 2208 10/12/14 2305 10/13/14 0412 10/13/14 0747 10/13/14 1156  GLUCAP 141* 103* 72 88 358*     Recent Results (from the past 240 hour(s))  Culture, blood (routine x 2)     Status: None (Preliminary result)   Collection Time: 10/12/14  4:31 PM  Result Value Ref Range Status   Specimen Description BLOOD RIGHT ARM  Final   Special Requests BOTTLES DRAWN AEROBIC AND ANAEROBIC 5CC EACH  Final   Culture   Final    NO GROWTH < 24 HOURS Performed at [facility]    Report Status PENDING  Incomplete  Culture, blood (routine x 2)     Status: None (Preliminary result)   Collection Time: 10/12/14  5:06 PM  Result Value Ref Range Status   Specimen Description BLOOD LEFT ARM  Final   Special Requests BOTTLES DRAWN AEROBIC AND ANAEROBIC 5CC EACH  Final   Culture   Final    NO GROWTH < 24 HOURS Performed at [facility]    Report Status PENDING  Incomplete  MRSA PCR Screening     Status: Abnormal   Collection Time: 10/13/14  1:05 AM  Result Value Ref Range Status   MRSA by PCR POSITIVE (A) NEGATIVE Final    Comment:        The GeneXpert MRSA Assay (FDA approved for NASAL specimens only), is one component of a comprehensive MRSA colonization surveillance program. It is not intended to diagnose MRSA infection nor to guide or monitor treatment for MRSA infections. RESULT CALLED TO, READ BACK BY AND VERIFIED WITH: CORE,Q/5E @0512  ON 10/13/14 BY KARCZEWSKI,S.      Studies: Dg Foot Complete Left  10/12/2014   CLINICAL DATA:  44 year old female with left foot ulcer and the fourth and fifth metatarsal base  EXAM: LEFT FOOT - COMPLETE 3+ VIEW  COMPARISON:  MRI dated 09/04/2014  FINDINGS: There is diffuse osteopenia. No acute fracture. There is chronic changes and deformity of the middle phalanx of the second toe. A skin ulcer is noted along the plantar aspect of the foot at the tarsometatarsal junction. No soft tissue gas or evidence of bony erosion identified. MRI may provide better evaluation if osteomyelitis is clinically suspected.  IMPRESSION: Skin ulcer at the  plantar aspect of the left foot. No soft tissue gas.  Osteopenia.  No acute fracture or bony erosion.   Electronically Signed   By: Anner Crete M.D.   On: 10/12/2014 17:20    Scheduled Meds: . calcitRIOL  0.25 mcg Oral Q M,W,F  . cefTAZidime (FORTAZ)  IV  2 g Intravenous Q12H  . enoxaparin (LOVENOX) injection  40 mg Subcutaneous Q24H  . ferrous sulfate  325 mg Oral TID WC  . folic acid  1 mg Oral Daily  . insulin pump   Subcutaneous TID AC, HS, 0200  . pravastatin  20 mg Oral q1800  . pyridoxine  100 mg Oral Daily  . sodium bicarbonate  650 mg Oral TID  . sodium chloride  3 mL Intravenous Q12H  . vancomycin  750 mg Intravenous Q24H  . vitamin B-12  200 mcg Oral Daily   Continuous Infusions:     Time  spent: 25 minutes    Louellen Molder  [facility] Pager [phone]. If 7PM-7AM, please contact night-coverage at [email or web address], password [facility] 10/13/2014, 1:51 PM  LOS: 1 day

## 2014-10-13 NOTE — Progress Notes (Signed)
Clinical Social Work  CSW received inappropriate referral for possible assistance with medication. Per consult, patient does not wish to see CSW. CSW is signing off but can be re-consulted if further needs arise.  [place], [place] [phone]

## 2014-10-13 NOTE — Progress Notes (Signed)
UR complete 

## 2014-10-13 NOTE — Progress Notes (Signed)
*  PRELIMINARY RESULTS* Vascular Ultrasound Left lower extremity venous duplex has been completed.  Preliminary findings: negative for DVT.  Landry Mellow, RDMS, RVT  10/13/2014, 3:22 PM

## 2014-10-13 NOTE — Progress Notes (Signed)
ANTIBIOTIC CONSULT NOTE - INITIAL  Pharmacy Consult for vancomycin, ceftazidime Indication: cellulitis, Left diabetic foot ulcer  Allergies  Allergen Reactions  . Ciprofloxacin Itching  . Cleocin [Clindamycin Hcl]     Diarrhea     Patient Measurements: Height: 5\' 7"  (170.2 cm) Weight: 122 lb 9.2 oz (55.6 kg) IBW/kg (Calculated) : 61.6 Adjusted Body Weight:   Vital Signs: Temp: 98.8 F (37.1 C) (07/13 0616) Temp Source: Oral (07/13 0616) BP: 138/71 mmHg (07/13 0616) Pulse Rate: 81 (07/13 0616) Intake/Output from previous day:   Intake/Output from this shift:    Labs:  Recent Labs  10/12/14 1632 10/13/14 0517  WBC 5.8 4.7  HGB 11.9* 11.1*  PLT 278 274  CREATININE 1.69* 1.56*   Estimated Creatinine Clearance: 41.2 mL/min (by C-G formula based on Cr of 1.56). No results for input(s): VANCOTROUGH, VANCOPEAK, VANCORANDOM, GENTTROUGH, GENTPEAK, GENTRANDOM, TOBRATROUGH, TOBRAPEAK, TOBRARND, AMIKACINPEAK, AMIKACINTROU, AMIKACIN in the last 72 hours.   Microbiology: Recent Results (from the past 720 hour(s))  MRSA PCR Screening     Status: Abnormal   Collection Time: 10/13/14  1:05 AM  Result Value Ref Range Status   MRSA by PCR POSITIVE (A) NEGATIVE Final    Comment:        The GeneXpert MRSA Assay (FDA approved for NASAL specimens only), is one component of a comprehensive MRSA colonization surveillance program. It is not intended to diagnose MRSA infection nor to guide or monitor treatment for MRSA infections. RESULT CALLED TO, READ BACK BY AND VERIFIED WITH: CORE,Q/5E @0512  ON 10/13/14 BY KARCZEWSKI,S.     Medical History: Past Medical History  Diagnosis Date  . Diabetes mellitus   . Neuropathy in diabetes   . Hypertension   . Hypercholesteremia   . H/O seasonal allergies   . Anemia   . Left foot infection     Medications:  Anti-infectives    Start     Dose/Rate Route Frequency Ordered Stop   10/13/14 1800  vancomycin (VANCOCIN) IVPB 750 mg/150  ml premix     750 mg 150 mL/hr over 60 Minutes Intravenous Every 24 hours 10/13/14 0002     10/13/14 0015  cefTAZidime (FORTAZ) 2 g in dextrose 5 % 50 mL IVPB     2 g 100 mL/hr over 30 Minutes Intravenous Every 12 hours 10/13/14 0002     10/12/14 1745  vancomycin (VANCOCIN) IVPB 1000 mg/200 mL premix     1,000 mg 200 mL/hr over 60 Minutes Intravenous  Once 10/12/14 1738 10/12/14 1851     Assessment: Patient with left diabetic foot ulcer.  First dose of antibiotics already given.    Goal of Therapy:  Vancomycin trough level 15-20 mcg/ml  Ceftazidime dosed based on patient weight and renal function   Plan:  Measure antibiotic drug levels at steady state Follow up culture results Vancomycin 750mg  iv q24hr Tressie Ellis 2gm iv q12hr  [name] 10/13/2014,7:01 AM

## 2014-10-13 NOTE — Progress Notes (Signed)
This RN received notification that patients nasal swab resulted positive for MRSA. MD notified.

## 2014-10-13 NOTE — Progress Notes (Addendum)
Removed staples from R foot per MD order. Patient tolerated well. Notified Marjory Lies in MRI that staples were removed and that MRI could be scheduled today.

## 2014-10-13 NOTE — Progress Notes (Signed)
Inpatient Diabetes Program Recommendations  AACE/ADA: New Consensus Statement on Inpatient Glycemic Control (2013)  Target Ranges:  Prepandial:   less than 140 mg/dL      Peak postprandial:   less than 180 mg/dL (1-2 hours)      Critically ill patients:  140 - 180 mg/dL   Reason for Visit: Insulin Pump  Diabetes history: Type 1 DM since age [age] years old Outpatient Diabetes medications: Insulin Pump - See settings below Current orders for Inpatient glycemic control: Insulin Pump  Basal rates: 2400-0600 @ 0.325 units/hr 0600-1200 @ 0.4 units/hr 1200-1700 @ 0.6 units/hr 1700-2200 @ 0.5 units/hr 2200-2400 @ 0.6 units/hr Total basal of 10.45 units/day  Bolus wizard settings: Insulin to cho ratio: 1:15 Sensitivity factor : 90 Target glucose goal: 150-150 mg/dL  Results for NEJRA, WINTLE (MRN [id number]) as of 10/13/2014 09:47  Ref. Range 10/12/2014 22:08 10/12/2014 23:05 10/13/2014 04:12 10/13/2014 07:47  Glucose-Capillary Latest Ref Range: 65-99 mg/dL 141 (H) 103 (H) 72 88     Results for ZANAB, WETSEL (MRN [id number]) as of 10/13/2014 09:47  Ref. Range 09/01/2014 18:50  Hemoglobin A1C Latest Ref Range: 4.8-5.6 % 9.7 (H)   Results for CHINA, GAUT (MRN [id number]) as of 10/13/2014 09:47  Ref. Range 10/12/2014 16:32 10/13/2014 05:17  Glucose Latest Ref Range: 65-99 mg/dL 324 (H) 62 (L)   Hypoglycemia this am. Pt states she did not eat yesterday. Signed contract for Insulin Pump. Sees Dr. Dwyane Dee for endo Pt states blood sugars usually < 200 mg/dL at home. Discussed HgbA1C and need to reduce to 7.0%. Questions answered. Will follow. Discussed with RN.  Thank you. Lorenda Peck, RD, LDN, CDE Inpatient Diabetes Coordinator [phone]

## 2014-10-13 NOTE — H&P (Signed)
[facility] History and Physical  SUEANNA WHEELDON [id number] DOB: [birth year] DOA: 10/12/2014  Referring physician: Patient was transferred from [facility]. Dr. Humphrey Rolls was accepting physician. PCP: Hayden Rasmussen., MD  Specialists: Dr. Dwyane Dee. Endocrinologist.  Chief Complaint: Fever chills and left lower extremity pain.  HPI: [name] is a 44 y.o. female with history of diabetes mellitus type 1 on insulin pump, chronic kidney disease, hyperlipidemia and hypertension presents to the ER because of fever and chills and noticed left lower extremity erythema with blister on the sole. Patient has known history of diabetic foot ulcer on the left foot. Patient was admitted last month for right foot cellulitis and osteomyelitis and was eventually transferred to [facility] and had foot amputation. Patient states she was discharged 2 days ago from rehabilitation and following which patient started developing fever and chills with pain in the left lower extremity. Patient noticed blister formation in the left foot. X-rays in the ER does not show anything acute. Patient has been admitted for cellulitis of the lower extremity and further workup. Patient states her blood sugar has been running well. Denies any chest pain or productive cough nausea vomiting or diarrhea. Patient has had previous history of C. difficile during last admission last month.   Review of Systems: As presented in the history of presenting illness, rest negative.  Past Medical History  Diagnosis Date  . Diabetes mellitus   . Neuropathy in diabetes   . Hypertension   . Hypercholesteremia   . H/O seasonal allergies   . Anemia   . Left foot infection    Past Surgical History  Procedure Laterality Date  . Cesarean section    . Eye surgery    . Hemrrhoidectomy    . Peripherally inserted central catheter insertion    . I&d extremity Right 06/10/2014    Procedure: IRRIGATION AND DEBRIDEMENT Right  Foot;  Surgeon: Newt Minion, MD;  Location: [place];  Service: Orthopedics;  Laterality: Right;  . Skin split graft Right 06/15/2014    Procedure: SPLIT THICKNESS SKIN GRAFT RIGHT FOOT;  Surgeon: Newt Minion, MD;  Location: [place];  Service: Orthopedics;  Laterality: Right;   Social History:  reports that she quit smoking about 6 years ago. She has never used smokeless tobacco. She reports that she drinks about 0.5 oz of alcohol per week. She reports that she does not use illicit drugs. Where does patient live home. Can patient participate in ADLs? Yes.  Allergies  Allergen Reactions  . Ciprofloxacin Itching  . Cleocin [Clindamycin Hcl]     Diarrhea     Family History:  Family History  Problem Relation Age of Onset  . Hyperlipidemia Mother       Prior to Admission medications   Medication Sig Start Date End Date Taking? Authorizing Provider  albuterol (PROVENTIL HFA;VENTOLIN HFA) 108 (90 BASE) MCG/ACT inhaler Inhale 2 puffs into the lungs every 6 (six) hours as needed. S.O.B.   Yes Historical Provider, MD  calcitRIOL (ROCALTROL) 0.25 MCG capsule Take 0.25 mcg by mouth every Monday, Wednesday, and Friday. M,W,F   Yes Historical Provider, MD  ferrous sulfate 325 (65 FE) MG tablet Take 325 mg by mouth 3 (three) times daily with meals.  11/23/13  Yes Historical Provider, MD  folic acid (FOLVITE) 1 MG tablet Take 1 mg by mouth daily.   Yes Historical Provider, MD  furosemide (LASIX) 20 MG tablet Take 20 mg by mouth daily as needed for fluid.  Yes Historical Provider, MD  insulin aspart (NOVOLOG) 100 UNIT/ML injection Inject into the skin as directed. Patient has insulin pump   Yes Historical Provider, MD  Insulin Human (INSULIN PUMP) SOLN Inject into the skin. Novolog   Yes Historical Provider, MD  lactobacillus acidophilus (BACID) TABS tablet Take 1 tablet by mouth 3 (three) times daily.   Yes Historical Provider, MD  Multiple Minerals-Vitamins ([employer] CALCIUM 600 PLUS) CHEW Chew 1,200 mg by  mouth daily.   Yes Historical Provider, MD  pravastatin (PRAVACHOL) 40 MG tablet TAKE 1/2 TABLETS (20 MG TOTAL) BY MOUTH DAILY. Patient taking differently: TAKE 1 TABLETS (40 MG TOTAL) BY MOUTH DAILY. 07/26/14  Yes Elayne Snare, MD  pyridoxine (B-6) 100 MG tablet Take 100 mg by mouth daily.   Yes Historical Provider, MD  sodium bicarbonate 650 MG tablet Take 650 mg by mouth 3 (three) times daily.   Yes Historical Provider, MD  vitamin B-12 (CYANOCOBALAMIN) 100 MCG tablet Take 200 mcg by mouth daily.    Yes Historical Provider, MD    Physical Exam: Filed Vitals:   10/12/14 1622 10/12/14 1833 10/12/14 2117 10/12/14 2243  BP: 139/74 126/63 140/68 125/62  Pulse: 90 84 93 89  Temp: 99.1 F (37.3 C)   100.1 F (37.8 C)  TempSrc: Oral   Oral  Resp: 18 16 18 18   Height: 5\' 7"  (1.702 m)   5\' 7"  (1.702 m)  Weight: 61.236 kg (135 lb)   55.6 kg (122 lb 9.2 oz)  SpO2: 100% 100% 99% 98%     General:  Moderately built and nourished.  Eyes: Anicteric no pallor.  ENT: No discharge from the ears eyes nose or mouth.  Neck: No mass felt.  Cardiovascular: S1-S2 heard.  Respiratory: No rhonchi or crepitations.  Abdomen: Soft nontender bowel sounds present.  Skin: Erythema involving the left foot and shin. There is also which appears chronic on the left sole. There is also a small blister in the left lateral aspect of the sole.  Musculoskeletal: Right foot has been amputated and dressed. See skin description.   Psychiatric: Appears normal.  Neurologic: Alert awake oriented to time place and person. Moves all extremities.  Labs on Admission:  Basic Metabolic Panel:  Recent Labs Lab 10/12/14 1632  NA 133*  K 5.0  CL 103  CO2 22  GLUCOSE 324*  BUN 40*  CREATININE 1.69*  CALCIUM 8.1*   Liver Function Tests:  Recent Labs Lab 10/12/14 1632  AST 37  ALT 94*  ALKPHOS 146*  BILITOT 0.5  PROT 6.6  ALBUMIN 3.0*   No results for input(s): LIPASE, AMYLASE in the last 168 hours. No  results for input(s): AMMONIA in the last 168 hours. CBC:  Recent Labs Lab 10/12/14 1632  WBC 5.8  NEUTROABS 4.2  HGB 11.9*  HCT 36.9  MCV 88.5  PLT 278   Cardiac Enzymes: No results for input(s): CKTOTAL, CKMB, CKMBINDEX, TROPONINI in the last 168 hours.  BNP (last 3 results) No results for input(s): BNP in the last 8760 hours.  ProBNP (last 3 results) No results for input(s): PROBNP in the last 8760 hours.  CBG:  Recent Labs Lab 10/12/14 2208 10/12/14 2305  GLUCAP 141* 103*    Radiological Exams on Admission: Dg Foot Complete Left  10/12/2014   CLINICAL DATA:  44 year old female with left foot ulcer and the fourth and fifth metatarsal base  EXAM: LEFT FOOT - COMPLETE 3+ VIEW  COMPARISON:  MRI dated 09/04/2014  FINDINGS: There is  diffuse osteopenia. No acute fracture. There is chronic changes and deformity of the middle phalanx of the second toe. A skin ulcer is noted along the plantar aspect of the foot at the tarsometatarsal junction. No soft tissue gas or evidence of bony erosion identified. MRI may provide better evaluation if osteomyelitis is clinically suspected.  IMPRESSION: Skin ulcer at the plantar aspect of the left foot. No soft tissue gas.  Osteopenia.  No acute fracture or bony erosion.   Electronically Signed   By: Anner Crete M.D.   On: 10/12/2014 17:20     Assessment/Plan Principal Problem:   Cellulitis of left foot Active Problems:   CKD (chronic kidney disease), stage III   Cellulitis   Diabetic foot ulcer   Type 1 diabetes mellitus with hyperglycemia   Chronic anemia   1. Cellulitis of the left lower extremity with diabetic foot ulcer and new blister formation - blood cultures have been obtained. I have ordered sedimentation rate and MRI of the left foot. I have requested wound team consult. Continue with empiric antibiotics vancomycin and cefepime. Further recommendation based on the MRI findings and blood culture results. 2. Diabetes  mellitus type 1 with hyperglycemia - insulin pump management per pharmacy. 3. Hyperlipidemia - continue present medication. 4. Chronic kidney disease stage III - creatinine appears to be at baseline. Closely follow. 5. History of hypertension presently not on any anti-hypertensives - closely follow blood pressure trends. 6. Chronic anemia - follow CBC. 7. 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   DVT ProphylaxisLovenox.  Code Status: Full code.  Family Communication: Discussed with patient.  Disposition Plan: Admit to inpatient.    Deny Chevez N. [facility] Pager [phone].  If 7PM-7AM, please contact night-coverage [email or web address] Password [email or web address] 10/13/2014, 1:09 AM

## 2014-10-14 DIAGNOSIS — E1022 Type 1 diabetes mellitus with diabetic chronic kidney disease: Secondary | ICD-10-CM

## 2014-10-14 DIAGNOSIS — E10621 Type 1 diabetes mellitus with foot ulcer: Principal | ICD-10-CM

## 2014-10-14 DIAGNOSIS — E1065 Type 1 diabetes mellitus with hyperglycemia: Secondary | ICD-10-CM

## 2014-10-14 DIAGNOSIS — L97529 Non-pressure chronic ulcer of other part of left foot with unspecified severity: Secondary | ICD-10-CM

## 2014-10-14 DIAGNOSIS — B958 Unspecified staphylococcus as the cause of diseases classified elsewhere: Secondary | ICD-10-CM

## 2014-10-14 DIAGNOSIS — Z8619 Personal history of other infectious and parasitic diseases: Secondary | ICD-10-CM

## 2014-10-14 DIAGNOSIS — N183 Chronic kidney disease, stage 3 (moderate): Secondary | ICD-10-CM

## 2014-10-14 DIAGNOSIS — L97429 Non-pressure chronic ulcer of left heel and midfoot with unspecified severity: Secondary | ICD-10-CM

## 2014-10-14 LAB — SEDIMENTATION RATE: Sed Rate: 83 mm/hr — ABNORMAL HIGH (ref 0–22)

## 2014-10-14 LAB — GLUCOSE, CAPILLARY
GLUCOSE-CAPILLARY: 221 mg/dL — AB (ref 65–99)
GLUCOSE-CAPILLARY: 207 mg/dL — AB (ref 65–99)
GLUCOSE-CAPILLARY: 48 mg/dL — AB (ref 65–99)
GLUCOSE-CAPILLARY: 96 mg/dL (ref 65–99)
Glucose-Capillary: 230 mg/dL — ABNORMAL HIGH (ref 65–99)
Glucose-Capillary: 82 mg/dL (ref 65–99)

## 2014-10-14 LAB — PREALBUMIN: Prealbumin: 16.4 mg/dL — ABNORMAL LOW (ref 18–38)

## 2014-10-14 LAB — PREGNANCY, URINE: PREG TEST UR: NEGATIVE

## 2014-10-14 LAB — C-REACTIVE PROTEIN: CRP: 8.1 mg/dL — ABNORMAL HIGH (ref <1.0)

## 2014-10-14 MED ORDER — VANCOMYCIN HCL IN DEXTROSE 750-5 MG/150ML-% IV SOLN
750.0000 mg | INTRAVENOUS | Status: DC
  Administered 2014-10-14 – 2014-10-18 (×5): 750 mg via INTRAVENOUS
  Filled 2014-10-14 (×5): qty 150

## 2014-10-14 MED ORDER — MUPIROCIN 2 % EX OINT
TOPICAL_OINTMENT | Freq: Two times a day (BID) | CUTANEOUS | Status: DC
  Administered 2014-10-14 – 2014-10-18 (×8): via NASAL
  Filled 2014-10-14: qty 22

## 2014-10-14 MED ORDER — VANCOMYCIN HCL 500 MG IV SOLR
500.0000 mg | Freq: Two times a day (BID) | INTRAVENOUS | Status: DC
  Filled 2014-10-14: qty 500

## 2014-10-14 MED ORDER — CHLORHEXIDINE GLUCONATE CLOTH 2 % EX PADS
6.0000 | MEDICATED_PAD | Freq: Every day | CUTANEOUS | Status: DC
  Administered 2014-10-14 – 2014-10-17 (×3): 6 via TOPICAL

## 2014-10-14 NOTE — Consult Note (Signed)
[facility]  Date of Admission:  10/12/2014  Date of Consult:  10/14/2014  Reason for Consult: Staph bacteremia Referring Physician: CHAMP  Impression/Recommendation Staph bacteremia DM1 CKD stage 3 LLE cellulitis L calf "knot" Diabetic Foot ulcer C diff previously  Would- Continue vanco pending sensi of Staph Check TTE Repeat BCx Stop ceftaz Have ortho eval pt.  Consider imaging her L calf (MRI?) diabetic foot order set  Thank you so much for this interesting consult,   Johny Sax (pager) [phone] [email or web address]  [name] is an 45 y.o. female.  HPI: 44 yo F with hx DM1, R transmet 09-2014 and C diff 09-2014, adm on 7-13 with 24h of f/c, LLE erythema and blistering on sole of L foot. Pt has had diabetic foot ulcer since 03-2014.  In ED pt had temp 100.1 and WBC 5.8.  She was started on vanco/ceftaz.  MRI was done 7-13 showing: 1. No abscess or osteomyelitis identified. 2. 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 3. Continued skin ulceration plantar to the Lisfranc joint laterally. Surrounding subcutaneous edema and cellulitis is primarily plantar with some mild dorsal extension. Low-level myositis along the plantar foot musculature.  BCx from adm have since shown 1/2 S aureus.   Past Medical History  Diagnosis Date  . Diabetes mellitus   . Neuropathy in diabetes   . Hypertension   . Hypercholesteremia   . H/O seasonal allergies   . Anemia   . Left foot infection     Past Surgical History  Procedure Laterality Date  . Cesarean section    . Eye surgery    . Hemrrhoidectomy    . Peripherally inserted central catheter insertion    . I&d extremity Right 06/10/2014    Procedure: IRRIGATION AND DEBRIDEMENT Right Foot;  Surgeon: Nadara Mustard, MD;  Location: [facility] OR;  Service: Orthopedics;  Laterality: Right;  . Skin split graft Right 06/15/2014   Procedure: SPLIT THICKNESS SKIN GRAFT RIGHT FOOT;  Surgeon: Nadara Mustard, MD;  Location: [facility] OR;  Service: Orthopedics;  Laterality: Right;     Allergies  Allergen Reactions  . Ciprofloxacin Itching  . Cleocin [Clindamycin Hcl]     Diarrhea     Medications:  Scheduled: . calcitRIOL  0.25 mcg Oral Q M,W,F  . cefTAZidime (FORTAZ)  IV  2 g Intravenous Q12H  . Chlorhexidine Gluconate Cloth  6 each Topical Q0600  . enoxaparin (LOVENOX) injection  40 mg Subcutaneous Q24H  . ferrous sulfate  325 mg Oral TID WC  . folic acid  1 mg Oral Daily  . insulin pump   Subcutaneous TID AC, HS, 0200  . mupirocin ointment   Nasal BID  . pravastatin  20 mg Oral q1800  . pyridoxine  100 mg Oral Daily  . sodium bicarbonate  650 mg Oral TID  . sodium chloride  3 mL Intravenous Q12H  . vancomycin  750 mg Intravenous Q24H  . vitamin B-12  200 mcg Oral Daily    Abtx:  Anti-infectives    Start     Dose/Rate Route Frequency Ordered Stop   10/14/14 1800  vancomycin (VANCOCIN) IVPB 750 mg/150 ml premix     750 mg 150 mL/hr over 60 Minutes Intravenous Every 24 hours 10/14/14 1217     10/14/14 1300  vancomycin (VANCOCIN) 500 mg in sodium chloride 0.9 % 100 mL IVPB  Status:  Discontinued     500 mg  100 mL/hr over 60 Minutes Intravenous 2 times daily 10/14/14 1213 10/14/14 1217   10/13/14 1800  vancomycin (VANCOCIN) IVPB 750 mg/150 ml premix  Status:  Discontinued     750 mg 150 mL/hr over 60 Minutes Intravenous Every 24 hours 10/13/14 0002 10/14/14 1213   10/13/14 0015  cefTAZidime (FORTAZ) 2 g in dextrose 5 % 50 mL IVPB     2 g 100 mL/hr over 30 Minutes Intravenous Every 12 hours 10/13/14 0002     10/12/14 1745  vancomycin (VANCOCIN) IVPB 1000 mg/200 mL premix     1,000 mg 200 mL/hr over 60 Minutes Intravenous  Once 10/12/14 1738 10/12/14 1851      Total days of antibiotics 1 vanco          Social History:  reports that she quit smoking about 6 years ago. She has never used smokeless tobacco. She  reports that she drinks about 0.5 oz of alcohol per week. She reports that she does not use illicit drugs.  Family History  Problem Relation Age of Onset  . Hyperlipidemia Mother     General ROS: sugars good at home, has cataracts/gets yearly ophtho, nl BM, nl urination, see hpi.   Blood pressure 129/71, pulse 77, temperature 98.3 F (36.8 C), temperature source Oral, resp. rate 16, height 5\' 7"  (1.702 m), weight 55.6 kg (122 lb 9.2 oz), SpO2 98 %. General appearance: alert, cooperative and no distress Eyes: negative findings: conjunctivae and sclerae normal and pupils equal, round, reactive to light and accomodation Throat: abnormal findings: dentition: poor Neck: no adenopathy and supple, symmetrical, trachea midline Lungs: clear to auscultation bilaterally Heart: regular rate and rhythm Abdomen: normal findings: bowel sounds normal and soft, non-tender Extremities: L foot with ~ 1 cm diameter ulcer, no d/c. non-tender. indurated area on L calf, tender, warm. non-fluctuant.    Results for orders placed or performed during the hospital encounter of 10/12/14 (from the past 48 hour(s))  Culture, blood (routine x 2)     Status: None (Preliminary result)   Collection Time: 10/12/14  4:31 PM  Result Value Ref Range   Specimen Description BLOOD RIGHT ARM    Special Requests BOTTLES DRAWN AEROBIC AND ANAEROBIC 5CC EACH    Culture  Setup Time      GRAM VARIABLE COCCI AEROBIC BOTTLE ONLY CRITICAL RESULT CALLED TO, READ BACK BY AND VERIFIED WITH: N DUNAS 10/13/14 @ 1555 M VESTAL    Culture      STAPHYLOCOCCUS AUREUS Performed at [facility]    Report Status PENDING   Comprehensive metabolic panel     Status: Abnormal   Collection Time: 10/12/14  4:32 PM  Result Value Ref Range   Sodium 133 (L) 135 - 145 mmol/L   Potassium 5.0 3.5 - 5.1 mmol/L   Chloride 103 101 - 111 mmol/L   CO2 22 22 - 32 mmol/L   Glucose, Bld 324 (H) 65 - 99 mg/dL   BUN 40 (H) 6 - 20 mg/dL   Creatinine,  Ser 12/13/14 (H) 0.44 - 1.00 mg/dL   Calcium 8.1 (L) 8.9 - 10.3 mg/dL   Total Protein 6.6 6.5 - 8.1 g/dL   Albumin 3.0 (L) 3.5 - 5.0 g/dL   AST 37 15 - 41 U/L   ALT 94 (H) 14 - 54 U/L   Alkaline Phosphatase 146 (H) 38 - 126 U/L   Total Bilirubin 0.5 0.3 - 1.2 mg/dL   GFR calc non Af Amer 36 (L) >60 mL/min  GFR calc Af Amer 42 (L) >60 mL/min    Comment: (NOTE) The eGFR has been calculated using the CKD EPI equation. This calculation has not been validated in all clinical situations. eGFR's persistently <60 mL/min signify possible Chronic Kidney Disease.    Anion gap 8 5 - 15  CBC with Differential     Status: Abnormal   Collection Time: 10/12/14  4:32 PM  Result Value Ref Range   WBC 5.8 4.0 - 10.5 K/uL   RBC 4.17 3.87 - 5.11 MIL/uL   Hemoglobin 11.9 (L) 12.0 - 15.0 g/dL   HCT 36.9 36.0 - 46.0 %   MCV 88.5 78.0 - 100.0 fL   MCH 28.5 26.0 - 34.0 pg   MCHC 32.2 30.0 - 36.0 g/dL   RDW 13.6 11.5 - 15.5 %   Platelets 278 150 - 400 K/uL   Neutrophils Relative % 73 43 - 77 %   Neutro Abs 4.2 1.7 - 7.7 K/uL   Lymphocytes Relative 18 12 - 46 %   Lymphs Abs 1.0 0.7 - 4.0 K/uL   Monocytes Relative 8 3 - 12 %   Monocytes Absolute 0.5 0.1 - 1.0 K/uL   Eosinophils Relative 1 0 - 5 %   Eosinophils Absolute 0.0 0.0 - 0.7 K/uL   Basophils Relative 0 0 - 1 %   Basophils Absolute 0.0 0.0 - 0.1 K/uL  Culture, blood (routine x 2)     Status: None (Preliminary result)   Collection Time: 10/12/14  5:06 PM  Result Value Ref Range   Specimen Description BLOOD LEFT ARM    Special Requests BOTTLES DRAWN AEROBIC AND ANAEROBIC 5CC EACH    Culture      NO GROWTH 2 DAYS Performed at [facility]    Report Status PENDING   CBG monitoring, ED     Status: Abnormal   Collection Time: 10/12/14 10:08 PM  Result Value Ref Range   Glucose-Capillary 141 (H) 65 - 99 mg/dL  Glucose, capillary     Status: Abnormal   Collection Time: 10/12/14 11:05 PM  Result Value Ref Range   Glucose-Capillary 103  (H) 65 - 99 mg/dL   Comment 1 Notify RN   MRSA PCR Screening     Status: Abnormal   Collection Time: 10/13/14  1:05 AM  Result Value Ref Range   MRSA by PCR POSITIVE (A) NEGATIVE    Comment:        The GeneXpert MRSA Assay (FDA approved for NASAL specimens only), is one component of a comprehensive MRSA colonization surveillance program. It is not intended to diagnose MRSA infection nor to guide or monitor treatment for MRSA infections. RESULT CALLED TO, READ BACK BY AND VERIFIED WITH: CORE,Q/5E $RemoveBeforeD'@0512'[email or web address]$  ON 10/13/14 BY KARCZEWSKI,S.   Glucose, capillary     Status: None   Collection Time: 10/13/14  4:12 AM  Result Value Ref Range   Glucose-Capillary 72 65 - 99 mg/dL  Sedimentation rate     Status: Abnormal   Collection Time: 10/13/14  5:17 AM  Result Value Ref Range   Sed Rate 53 (H) 0 - 22 mm/hr  Comprehensive metabolic panel     Status: Abnormal   Collection Time: 10/13/14  5:17 AM  Result Value Ref Range   Sodium 138 135 - 145 mmol/L   Potassium 4.7 3.5 - 5.1 mmol/L   Chloride 105 101 - 111 mmol/L   CO2 23 22 - 32 mmol/L   Glucose, Bld 62 (L) 65 - 99 mg/dL  BUN 37 (H) 6 - 20 mg/dL   Creatinine, Ser 1.56 (H) 0.44 - 1.00 mg/dL   Calcium 8.3 (L) 8.9 - 10.3 mg/dL   Total Protein 5.9 (L) 6.5 - 8.1 g/dL   Albumin 2.5 (L) 3.5 - 5.0 g/dL   AST 25 15 - 41 U/L   ALT 69 (H) 14 - 54 U/L   Alkaline Phosphatase 125 38 - 126 U/L   Total Bilirubin 0.3 0.3 - 1.2 mg/dL   GFR calc non Af Amer 40 (L) >60 mL/min   GFR calc Af Amer 46 (L) >60 mL/min    Comment: (NOTE) The eGFR has been calculated using the CKD EPI equation. This calculation has not been validated in all clinical situations. eGFR's persistently <60 mL/min signify possible Chronic Kidney Disease.    Anion gap 10 5 - 15  CBC WITH DIFFERENTIAL     Status: Abnormal   Collection Time: 10/13/14  5:17 AM  Result Value Ref Range   WBC 4.7 4.0 - 10.5 K/uL   RBC 3.87 3.87 - 5.11 MIL/uL   Hemoglobin 11.1 (L) 12.0 - 15.0 g/dL    HCT 34.4 (L) 36.0 - 46.0 %   MCV 88.9 78.0 - 100.0 fL   MCH 28.7 26.0 - 34.0 pg   MCHC 32.3 30.0 - 36.0 g/dL   RDW 14.0 11.5 - 15.5 %   Platelets 274 150 - 400 K/uL   Neutrophils Relative % 53 43 - 77 %   Neutro Abs 2.4 1.7 - 7.7 K/uL   Lymphocytes Relative 32 12 - 46 %   Lymphs Abs 1.5 0.7 - 4.0 K/uL   Monocytes Relative 14 (H) 3 - 12 %   Monocytes Absolute 0.7 0.1 - 1.0 K/uL   Eosinophils Relative 1 0 - 5 %   Eosinophils Absolute 0.1 0.0 - 0.7 K/uL   Basophils Relative 0 0 - 1 %   Basophils Absolute 0.0 0.0 - 0.1 K/uL  Glucose, capillary     Status: None   Collection Time: 10/13/14  7:47 AM  Result Value Ref Range   Glucose-Capillary 88 65 - 99 mg/dL  Glucose, capillary     Status: Abnormal   Collection Time: 10/13/14 11:56 AM  Result Value Ref Range   Glucose-Capillary 358 (H) 65 - 99 mg/dL   Comment 1 Notify RN   Glucose, capillary     Status: Abnormal   Collection Time: 10/13/14  4:53 PM  Result Value Ref Range   Glucose-Capillary 231 (H) 65 - 99 mg/dL   Comment 1 Notify RN   Glucose, capillary     Status: Abnormal   Collection Time: 10/13/14  9:50 PM  Result Value Ref Range   Glucose-Capillary 250 (H) 65 - 99 mg/dL  Glucose, capillary     Status: Abnormal   Collection Time: 10/14/14  6:07 AM  Result Value Ref Range   Glucose-Capillary 221 (H) 65 - 99 mg/dL   Comment 1 Notify RN    Comment 2 Document in Chart   Pregnancy, urine     Status: None   Collection Time: 10/14/14  6:58 AM  Result Value Ref Range   Preg Test, Ur NEGATIVE NEGATIVE    Comment:        THE SENSITIVITY OF THIS METHODOLOGY IS >20 mIU/mL.   Glucose, capillary     Status: Abnormal   Collection Time: 10/14/14  7:45 AM  Result Value Ref Range   Glucose-Capillary 207 (H) 65 - 99 mg/dL  Glucose, capillary  Status: Abnormal   Collection Time: 10/14/14  1:24 PM  Result Value Ref Range   Glucose-Capillary 230 (H) 65 - 99 mg/dL   Comment 1 Notify RN       Component Value Date/Time   SDES  BLOOD LEFT ARM 10/12/2014 1706   SPECREQUEST BOTTLES DRAWN AEROBIC AND ANAEROBIC 5CC EACH 10/12/2014 1706   CULT  10/12/2014 1706    NO GROWTH 2 DAYS Performed at [facility] PENDING 10/12/2014 1706   Mr Foot Left Wo Contrast  10/14/2014   CLINICAL DATA:  Plantar diabetic foot ulcer. Pain and blistering. Cellulitis.  EXAM: MRI OF THE LEFT FOREFOOT WITHOUT CONTRAST  TECHNIQUE: Multiplanar, multisequence MR imaging was performed. No intravenous contrast was administered.  COMPARISON:  Multiple exams, including 09/04/2014  FINDINGS: Plantar ulceration in skin disruption below the lateral portion of the Lisfranc joint as on image 11 of series 5 with surrounding edema in the subcutaneous tissues. The underlying peroneus longus tendon appears normal, and no drainable abscess is observed in the foot.  Regional subcutaneous edema tracks primarily along the plantar foot towards the toes, but also with some low-level edema tracking around to the dorsal foot. Also low-level is the edema tracking within the plantar foot musculature.  No significant marrow edema in the forefoot, midfoot, or visualize part of the hindfoot to suggest osteomyelitis. There is some speckled T2 high signal hyperintensities in the bone suggesting osteoporosis of disuse.  The Lisfranc ligament appears intact. There appears to been fusion of the navicular, medial cuneiform, and probably the middle cuneiform. Pes planus noted.  IMPRESSION: 1. No abscess or osteomyelitis identified. 2. 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 3. Continued skin ulceration plantar to the Lisfranc joint laterally. Surrounding subcutaneous edema and cellulitis is primarily plantar with some mild dorsal extension. Low-level myositis along the plantar foot musculature.   Electronically Signed   By: Van Clines M.D.   On: 10/14/2014 07:43   Dg Foot Complete Left  10/12/2014    CLINICAL DATA:  44 year old female with left foot ulcer and the fourth and fifth metatarsal base  EXAM: LEFT FOOT - COMPLETE 3+ VIEW  COMPARISON:  MRI dated 09/04/2014  FINDINGS: There is diffuse osteopenia. No acute fracture. There is chronic changes and deformity of the middle phalanx of the second toe. A skin ulcer is noted along the plantar aspect of the foot at the tarsometatarsal junction. No soft tissue gas or evidence of bony erosion identified. MRI may provide better evaluation if osteomyelitis is clinically suspected.  IMPRESSION: Skin ulcer at the plantar aspect of the left foot. No soft tissue gas.  Osteopenia.  No acute fracture or bony erosion.   Electronically Signed   By: Anner Crete M.D.   On: 10/12/2014 17:20   Recent Results (from the past 240 hour(s))  Culture, blood (routine x 2)     Status: None (Preliminary result)   Collection Time: 10/12/14  4:31 PM  Result Value Ref Range Status   Specimen Description BLOOD RIGHT ARM  Final   Special Requests BOTTLES DRAWN AEROBIC AND ANAEROBIC 5CC EACH  Final   Culture  Setup Time   Final    GRAM VARIABLE COCCI AEROBIC BOTTLE ONLY CRITICAL RESULT CALLED TO, READ BACK BY AND VERIFIED WITH: N DUNAS 10/13/14 @ 1555 M VESTAL    Culture   Final    STAPHYLOCOCCUS AUREUS Performed at [facility]    Report  Status PENDING  Incomplete  Culture, blood (routine x 2)     Status: None (Preliminary result)   Collection Time: 10/12/14  5:06 PM  Result Value Ref Range Status   Specimen Description BLOOD LEFT ARM  Final   Special Requests BOTTLES DRAWN AEROBIC AND ANAEROBIC 5CC EACH  Final   Culture   Final    NO GROWTH 2 DAYS Performed at [facility]    Report Status PENDING  Incomplete  MRSA PCR Screening     Status: Abnormal   Collection Time: 10/13/14  1:05 AM  Result Value Ref Range Status   MRSA by PCR POSITIVE (A) NEGATIVE Final    Comment:        The GeneXpert MRSA Assay (FDA approved for NASAL specimens only),  is one component of a comprehensive MRSA colonization surveillance program. It is not intended to diagnose MRSA infection nor to guide or monitor treatment for MRSA infections. RESULT CALLED TO, READ BACK BY AND VERIFIED WITH: CORE,Q/5E @0512  ON 10/13/14 BY KARCZEWSKI,S.       10/14/2014, 3:32 PM     LOS: 2 days          [facility] Staphylococcus aureus bacteremia   Staphylococcus aureus bacteremia (SAB) is associated with a high rate of complications and mortality.  Specific aspects of clinical management are critical to optimizing the outcome of patients with SAB.  Therefore, the [facility] Antimicrobial Management Team [facility]) has initiated an intervention aimed at improving the management of SAB at [facility].  To do so, Infectious Diseases physicians are providing an evidence-based consult for the management of all patients with SAB.     Yes No Comments  Perform follow-up blood cultures (even if the patient is afebrile) to ensure clearance of bacteremia [x]  []    Remove vascular catheter and obtain follow-up blood cultures after the removal of the catheter []  [x]    Perform echocardiography to evaluate for endocarditis (transthoracic ECHO is 40-50% sensitive, TEE is > 90% sensitive) [x]  []  Please keep in mind, that neither test can definitively EXCLUDE endocarditis, and that should clinical suspicion remain high for endocarditis the patient should then still be treated with an "endocarditis" duration of therapy = 6 weeks  Consult electrophysiologist to evaluate implanted cardiac device (pacemaker, ICD) []  []    Ensure source control []  []  Have all abscesses been drained effectively? Have deep seeded infections (septic joints or osteomyelitis) had appropriate surgical debridement?  Investigate for "metastatic" sites of infection []  []  Does the patient have ANY symptom or physical exam finding that would suggest a deeper infection (back or neck pain  that may be suggestive of vertebral osteomyelitis or epidural abscess, muscle pain that could be a symptom of pyomyositis)?  Keep in mind that for deep seeded infections MRI imaging with contrast is preferred rather than other often insensitive tests such as plain x-rays, especially early in a patient's presentation.  Change antibiotic therapy to __________________ []  []  Beta-lactam antibiotics are preferred for MSSA due to higher cure rates.   If on Vancomycin, goal trough should be 15 - 20 mcg/mL  Estimated duration of IV antibiotic therapy:   []  []  Consult case management for probably prolonged outpatient IV antibiotic therapy

## 2014-10-14 NOTE — Progress Notes (Signed)
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 After returning home patient developed fever with chills and pain in her left leg. She also noticed blister over the left foot. X-ray done in the ED was negative for ostium myelitis of the left foot. Patient had low-grade fever on presentation and admitted for cellulitis of the left foot with concern for osteomyelitis.   Assessment/Plan: Left lower leg cellulitis with diabetic foot ulcer over the left plantar surface Continue empiric vancomycin and ceftazidime. MRI of the left foot ordered negative for abscess or osteomyelitis and shows area of cellulitis.  -Pain control with when necessary morphine. Wound care consult appreciated. Continue daily dressing. Follow blood cultures. Continue supportive care. -PT eval ordered.   Type 1 diabetes mellitus Uncontrolled with last A1C of 9.7. On insulin pump.fsg in 200s.   Left lower leg and calf tenderness  Doppler lower extremity  ruled out DVT.  Chronic kidney disease stage III Renal function at baseline  Dyslipidemia Continue statin  DVT prophylaxis: Subcutaneous heparin  Code Status: Full code  Family Communication: None at bedside  Disposition Plan: Continue inpatient monitoring for now. Physical therapy evaluation pending. Home possibly in next 48 hrs if clinicially improved  Consultants:  none  Procedures:  MRI left foot  Antibiotics: IV vancomycin and ceftazidime 7/13  HPI/Subjective: Patient seen and  examined. Still has pain in her left leg. MRI results reviewed and discussed with patient. Afebrile.  Objective: Filed Vitals:   10/14/14 0604  BP: 129/71  Pulse: 77  Temp: 98.3 F (36.8 C)  Resp: 16    Intake/Output Summary (Last 24 hours) at 10/14/14 1516 Last data filed at 10/14/14 0911  Gross per 24 hour  Intake    720 ml  Output      0 ml  Net    720 ml   Filed Weights   10/12/14 1622 10/12/14 2243  Weight: 61.236 kg (135 lb) 55.6 kg (122 lb 9.2 oz)    Exam:   General:   no acute distress  HEENT:  moist oral mucosa  Chest: Clear to auscultation bilaterally, no added sounds  CVS: Normal S1 and S2, no murmurs  GI: Soft, nondistended, nontender, bowel sounds present Musculoskeletal: Warm, no edema, tender to palpation over left calf, dressing over left foot, staples over  rt foot amputation removed CNS: Alert and oriented   Data Reviewed: Basic Metabolic Panel:  Recent Labs Lab 10/12/14 1632 10/13/14 0517  NA 133* 138  K 5.0 4.7  CL 103 105  CO2 22 23  GLUCOSE 324* 62*  BUN 40* 37*  CREATININE 1.69* 1.56*  CALCIUM 8.1* 8.3*   Liver Function Tests:  Recent Labs Lab 10/12/14 1632 10/13/14 0517  AST 37 25  ALT 94* 69*  ALKPHOS 146* 125  BILITOT 0.5 0.3  PROT 6.6 5.9*  ALBUMIN 3.0* 2.5*   No results for input(s): LIPASE, AMYLASE in the last 168 hours. No results for input(s): AMMONIA in the last 168 hours. CBC:  Recent Labs Lab 10/12/14 1632 10/13/14 0517  WBC 5.8 4.7  NEUTROABS 4.2 2.4  HGB 11.9* 11.1*  HCT 36.9 34.4*  MCV 88.5 88.9  PLT 278 274   Cardiac Enzymes: No results for input(s): CKTOTAL, CKMB, CKMBINDEX, TROPONINI in the last 168 hours. BNP (last 3 results) No results for input(s): BNP in the last 8760 hours.  ProBNP (last 3 results) No results for input(s): PROBNP in the last 8760 hours.  CBG:  Recent Labs Lab 10/13/14 1653 10/13/14 2150 10/14/14 0607 10/14/14 0745 10/14/14 1324  GLUCAP 231* 250* 221* 207*  230*    Recent Results (from the past 240 hour(s))  Culture, blood (routine x 2)     Status: None (Preliminary result)   Collection Time: 10/12/14  4:31 PM  Result Value Ref Range Status   Specimen Description BLOOD RIGHT ARM  Final   Special Requests BOTTLES DRAWN AEROBIC AND ANAEROBIC 5CC EACH  Final   Culture  Setup Time   Final    GRAM VARIABLE COCCI AEROBIC BOTTLE ONLY CRITICAL RESULT CALLED TO, READ BACK BY AND VERIFIED WITH: N DUNAS 10/13/14 @ 1555 M VESTAL    Culture   Final    STAPHYLOCOCCUS AUREUS Performed at [facility]    Report Status PENDING  Incomplete  Culture, blood (routine x 2)     Status: None (Preliminary result)   Collection Time: 10/12/14  5:06 PM  Result Value Ref Range Status   Specimen Description BLOOD LEFT ARM  Final   Special Requests BOTTLES DRAWN AEROBIC AND ANAEROBIC 5CC EACH  Final   Culture   Final    NO GROWTH 2 DAYS Performed at [facility]    Report Status PENDING  Incomplete  MRSA PCR Screening     Status: Abnormal   Collection Time: 10/13/14  1:05 AM  Result Value Ref Range Status   MRSA by PCR POSITIVE (A) NEGATIVE Final    Comment:        The GeneXpert MRSA Assay (FDA approved for NASAL specimens only), is one component of a comprehensive MRSA colonization surveillance program. It is not intended to diagnose MRSA infection nor to guide or monitor treatment for MRSA infections. RESULT CALLED TO, READ BACK BY AND VERIFIED WITH: CORE,Q/5E @0512  ON 10/13/14 BY KARCZEWSKI,S.      Studies: Mr Foot Left Wo Contrast  10/14/2014   CLINICAL DATA:  Plantar diabetic foot ulcer. Pain and blistering. Cellulitis.  EXAM: MRI OF THE LEFT FOREFOOT WITHOUT CONTRAST  TECHNIQUE: Multiplanar, multisequence MR imaging was performed. No intravenous contrast was administered.  COMPARISON:  Multiple exams, including 09/04/2014  FINDINGS: Plantar ulceration in skin disruption below the lateral portion of the Lisfranc joint as on image 11  of series 5 with surrounding edema in the subcutaneous tissues. The underlying peroneus longus tendon appears normal, and no drainable abscess is observed in the foot.  Regional subcutaneous edema tracks primarily along the plantar foot towards the toes, but also with some low-level edema tracking around to the dorsal foot. Also low-level is the edema tracking within the plantar foot musculature.  No significant marrow edema in the forefoot, midfoot, or visualize part of the hindfoot to suggest osteomyelitis. There is some speckled T2 high signal hyperintensities in the bone suggesting osteoporosis of disuse.  The Lisfranc ligament appears intact. There appears to been fusion of the navicular, medial cuneiform, and probably the middle cuneiform. Pes planus noted.  IMPRESSION: 1. No abscess or osteomyelitis identified. 2. 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 3. Continued skin ulceration plantar  to the Lisfranc joint laterally. Surrounding subcutaneous edema and cellulitis is primarily plantar with some mild dorsal extension. Low-level myositis along the plantar foot musculature.   Electronically Signed   By: Van Clines M.D.   On: 10/14/2014 07:43   Dg Foot Complete Left  10/12/2014   CLINICAL DATA:  44 year old female with left foot ulcer and the fourth and fifth metatarsal base  EXAM: LEFT FOOT - COMPLETE 3+ VIEW  COMPARISON:  MRI dated 09/04/2014  FINDINGS: There is diffuse osteopenia. No acute fracture. There is chronic changes and deformity of the middle phalanx of the second toe. A skin ulcer is noted along the plantar aspect of the foot at the tarsometatarsal junction. No soft tissue gas or evidence of bony erosion identified. MRI may provide better evaluation if osteomyelitis is clinically suspected.  IMPRESSION: Skin ulcer at the plantar aspect of the left foot. No soft tissue gas.  Osteopenia.  No acute fracture or bony erosion.    Electronically Signed   By: Anner Crete M.D.   On: 10/12/2014 17:20    Scheduled Meds: . calcitRIOL  0.25 mcg Oral Q M,W,F  . cefTAZidime (FORTAZ)  IV  2 g Intravenous Q12H  . Chlorhexidine Gluconate Cloth  6 each Topical Q0600  . enoxaparin (LOVENOX) injection  40 mg Subcutaneous Q24H  . ferrous sulfate  325 mg Oral TID WC  . folic acid  1 mg Oral Daily  . insulin pump   Subcutaneous TID AC, HS, 0200  . mupirocin ointment   Nasal BID  . pravastatin  20 mg Oral q1800  . pyridoxine  100 mg Oral Daily  . sodium bicarbonate  650 mg Oral TID  . sodium chloride  3 mL Intravenous Q12H  . vancomycin  750 mg Intravenous Q24H  . vitamin B-12  200 mcg Oral Daily   Continuous Infusions:     Time spent: 25 minutes    Aigner Horseman, [place]  [facility] Pager [phone]. If 7PM-7AM, please contact night-coverage at [email or web address], password [facility] 10/14/2014, 3:16 PM  LOS: 2 days

## 2014-10-14 NOTE — Progress Notes (Signed)
Hypoglycemic Event  CBG: 48  Treatment: 2 plums per patient request  Symptoms: None  Follow-up CBG: Time: CBG Result:  82 Possible Reasons for Event:  Not enough intake  Comments/MD notified:    Claris Pong  Remember to initiate Hypoglycemia Order Set & complete

## 2014-10-14 NOTE — Care Management Important Message (Signed)
Important Message  Patient Details IM Letter given to Danielle/RN to give to patientImportant Message  Patient Details  Name: [name] MRN: [id number] Date of Birth: 11/20/1970   Medicare Important Message Given:  Yes-second notification given    Camillo Flaming 10/14/2014, 11:45 AM Name: [name] MRN: [id number] Date of Birth: 02/22/1971   Medicare Important Message Given:  Yes-second notification given    Camillo Flaming 10/14/2014, 11:44 AM

## 2014-10-15 ENCOUNTER — Inpatient Hospital Stay (HOSPITAL_COMMUNITY): Payer: Medicare Other

## 2014-10-15 DIAGNOSIS — B9562 Methicillin resistant Staphylococcus aureus infection as the cause of diseases classified elsewhere: Secondary | ICD-10-CM

## 2014-10-15 DIAGNOSIS — R509 Fever, unspecified: Secondary | ICD-10-CM

## 2014-10-15 DIAGNOSIS — L97429 Non-pressure chronic ulcer of left heel and midfoot with unspecified severity: Secondary | ICD-10-CM

## 2014-10-15 DIAGNOSIS — R7881 Bacteremia: Secondary | ICD-10-CM | POA: Diagnosis present

## 2014-10-15 LAB — HIV ANTIBODY (ROUTINE TESTING W REFLEX): HIV SCREEN 4TH GENERATION: NONREACTIVE

## 2014-10-15 LAB — GLUCOSE, CAPILLARY
Glucose-Capillary: 154 mg/dL — ABNORMAL HIGH (ref 65–99)
Glucose-Capillary: 189 mg/dL — ABNORMAL HIGH (ref 65–99)
Glucose-Capillary: 171 mg/dL — ABNORMAL HIGH (ref 65–99)
Glucose-Capillary: 135 mg/dL — ABNORMAL HIGH (ref 65–99)
Glucose-Capillary: 61 mg/dL — ABNORMAL LOW (ref 65–99)

## 2014-10-15 LAB — CREATININE, SERUM
Creatinine, Ser: 1.34 mg/dL — ABNORMAL HIGH (ref 0.44–1.00)
GFR, EST AFRICAN AMERICAN: 56 mL/min — AB (ref >60)
GFR, EST NON AFRICAN AMERICAN: 48 mL/min — AB (ref >60)

## 2014-10-15 LAB — CULTURE, BLOOD (ROUTINE X 2)

## 2014-10-15 LAB — VANCOMYCIN, TROUGH: VANCOMYCIN TR: 18 ug/mL (ref 10.0–20.0)

## 2014-10-15 LAB — HEMOGLOBIN A1C
Hgb A1c MFr Bld: 8.6 % — ABNORMAL HIGH (ref 4.8–5.6)
Mean Plasma Glucose: 200 mg/dL

## 2014-10-15 LAB — CK: Total CK: 46 U/L (ref 38–234)

## 2014-10-15 MED ORDER — RIVAROXABAN 15 MG PO TABS
15.0000 mg | ORAL_TABLET | Freq: Once | ORAL | Status: AC
  Administered 2014-10-15: 15 mg via ORAL
  Filled 2014-10-15: qty 1

## 2014-10-15 MED ORDER — RIVAROXABAN 15 MG PO TABS
15.0000 mg | ORAL_TABLET | Freq: Two times a day (BID) | ORAL | Status: DC
  Administered 2014-10-16 – 2014-10-18 (×6): 15 mg via ORAL
  Filled 2014-10-15 (×8): qty 1

## 2014-10-15 NOTE — Progress Notes (Signed)
Hypoglycemic Event  CBG: 47 (2357)  Treatment: 8oz juice  Symptoms: tired  Follow-up CBG: Time:0024 CBG Result:59  Possible Reasons for Event: brittle diabetic Comments/MD notified:n/a; hypoglycemia protocol followed.   [name]  Remember to initiate Hypoglycemia Order Set & complete

## 2014-10-15 NOTE — Progress Notes (Signed)
Hypoglycemic Event  CBG: 61 at 2247 (just notified by patient/tech)  Treatment:4 oz given at 2320  Symptoms: tired  Follow-up CBG: Time:2357 CBG Result:47  Possible Reasons for Event: brittle diabetic  Comments/MD notified: n/a hypoglycemia protocol followed.    [name]  Remember to initiate Hypoglycemia Order Set & complete

## 2014-10-15 NOTE — Progress Notes (Signed)
ANTIBIOTIC CONSULT NOTE - Follow-Up  Pharmacy Consult for vancomycin Indication: MRSA bacteremia, diabetic foot infection  Allergies  Allergen Reactions  . Ciprofloxacin Itching  . Cleocin [Clindamycin Hcl]     Diarrhea     Patient Measurements: Height: 5\' 7"  (170.2 cm) Weight: 122 lb 9.2 oz (55.6 kg) IBW/kg (Calculated) : 61.6   Vital Signs: Temp: 97.8 F (36.6 C) (07/15 0613) Temp Source: Oral (07/15 [id number]) BP: 141/73 mmHg (07/15 [id number]) Pulse Rate: 70 (07/15 0613) Intake/Output from previous day: 07/14 0701 - 07/15 0700 In: 960 [P.O.:960] Out: -  Intake/Output from this shift: Total I/O In: -  Out: 700 [Urine:700]  Labs:  Recent Labs  10/12/14 1632 10/13/14 0517  WBC 5.8 4.7  HGB 11.9* 11.1*  PLT 278 274  CREATININE 1.69* 1.56*   Estimated Creatinine Clearance: 41.2 mL/min (by C-G formula based on Cr of 1.56). No results for input(s): VANCOTROUGH, VANCOPEAK, VANCORANDOM, GENTTROUGH, GENTPEAK, GENTRANDOM, TOBRATROUGH, TOBRAPEAK, TOBRARND, AMIKACINPEAK, AMIKACINTROU, AMIKACIN in the last 72 hours.   Microbiology: Recent Results (from the past 720 hour(s))  Culture, blood (routine x 2)     Status: None   Collection Time: 10/12/14  4:31 PM  Result Value Ref Range Status   Specimen Description BLOOD RIGHT ARM  Final   Special Requests BOTTLES DRAWN AEROBIC AND ANAEROBIC 5CC EACH  Final   Culture  Setup Time   Final    GRAM VARIABLE COCCI AEROBIC BOTTLE ONLY CRITICAL RESULT CALLED TO, READ BACK BY AND VERIFIED WITH: N DUNAS 10/13/14 @ 1555 M VESTAL    Culture   Final    METHICILLIN RESISTANT STAPHYLOCOCCUS AUREUS Performed at [facility]    Report Status 10/15/2014 FINAL  Final   Organism ID, Bacteria METHICILLIN RESISTANT STAPHYLOCOCCUS AUREUS  Final      Susceptibility   Methicillin resistant staphylococcus aureus - MIC*    CIPROFLOXACIN >=8 RESISTANT Resistant     ERYTHROMYCIN >=8 RESISTANT Resistant     GENTAMICIN <=0.5 SENSITIVE Sensitive     OXACILLIN >=4 RESISTANT Resistant     TETRACYCLINE <=1 SENSITIVE Sensitive     VANCOMYCIN 1 SENSITIVE Sensitive     TRIMETH/SULFA <=10 SENSITIVE Sensitive     CLINDAMYCIN >=8 RESISTANT Resistant     RIFAMPIN <=0.5 SENSITIVE Sensitive     Inducible Clindamycin NEGATIVE Sensitive     * METHICILLIN RESISTANT STAPHYLOCOCCUS AUREUS  Culture, blood (routine x 2)     Status: None (Preliminary result)   Collection Time: 10/12/14  5:06 PM  Result Value Ref Range Status   Specimen Description BLOOD LEFT ARM  Final   Special Requests BOTTLES DRAWN AEROBIC AND ANAEROBIC 5CC EACH  Final   Culture   Final    NO GROWTH 2 DAYS Performed at [facility]    Report Status PENDING  Incomplete  MRSA PCR Screening     Status: Abnormal   Collection Time: 10/13/14  1:05 AM  Result Value Ref Range Status   MRSA by PCR POSITIVE (A) NEGATIVE Final    Comment:        The GeneXpert MRSA Assay (FDA approved for NASAL specimens only), is one component of a comprehensive MRSA colonization surveillance program. It is not intended to diagnose MRSA infection nor to guide or monitor treatment for MRSA infections. RESULT CALLED TO, READ BACK BY AND VERIFIED WITH: CORE,Q/5E @0512  ON 10/13/14 BY KARCZEWSKI,S.   Blood Cultures x 2 sites     Status: None (Preliminary result)   Collection Time: 10/14/14  4:30  PM  Result Value Ref Range Status   Specimen Description BLOOD LEFT ARM  Final   Special Requests IN PEDIATRIC BOTTLE  2CC  Final   Culture PENDING  Incomplete   Report Status PENDING  Incomplete  Blood Cultures x 2 sites     Status: None (Preliminary result)   Collection Time: 10/14/14  4:35 PM  Result Value Ref Range Status   Specimen Description BLOOD RIGHT HAND  Final   Special Requests IN PEDIATRIC BOTTLE  1CC  Final   Culture PENDING  Incomplete   Report Status PENDING  Incomplete    Medical History: Past Medical History  Diagnosis Date  . Diabetes mellitus   . Neuropathy in diabetes    . Hypertension   . Hypercholesteremia   . H/O seasonal allergies   . Anemia   . Left foot infection     Antimicrobials: 7/12>>vancomycin>> 7/13>>ceftazidime>>7/14  Assessment: 44 y/o F admitted with diabetic foot infection, r/o osteomyelitis. Orders received to begin empiric cefepime and vancomycin with pharmacy dosing assistance  Goal of Therapy:  Vancomycin trough level 15-20 mcg/ml   Today, 10/15/2014:  D#4 vancomycin (now 750 mg q24h after 1 g x 1 dose) Afebrile WBC WNL since admission SCr was elevated, due for recheck Staph aureus isolate in blood now identified as MRSA  Plan:  1. Check vancomycin trough (likely pre-steady-state) and serum creatinine tonight. 2. Follow renal function closely. 3. Await further guidance from ID service regarding planned DOT.  Clayburn Pert, PharmD, BCPS Pager: [phone] 10/15/2014  1:39 PM

## 2014-10-15 NOTE — Progress Notes (Signed)
MRI LLE concerning for DVT in lower leg. Given clinical symptoms with calf tenderness, recent foot surgery and non ambulate status will plan to anticoagulate with xarelto. D/w MRI finding fo doppler result with vascular surgeon Dr Scot Dock who recommends to treat her and follow up with repeat doppler in 3 months.  Discussed MRI finding and plan to anticoagulate with patient.

## 2014-10-15 NOTE — Progress Notes (Signed)
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 After returning home patient developed fever with chills and pain in her left leg. She also noticed blister over the left foot. X-ray done in the ED was negative for ostium myelitis of the left foot. Patient had low-grade fever on presentation and admitted for cellulitis of the left foot with concern for osteomyelitis.   Assessment/Plan: Left lower leg cellulitis with diabetic foot ulcer and MRSA bacteremia Continue empiric vancomycin. Discontinue ceftazidime. MRI of the left foot  negative for abscess or osteomyelitis and shows area of cellulitis. Patient still having severe pain in her left calf. Doppler negative for DVT. I would order MRI of her leg to r/o underlying abscess or tibial osteomyelitis. -2-D echo negative for vegetation. -Pain control with when necessary morphine and Toradol. Wound care consult appreciated. Continue daily dressing. -Follow final blood cultures. Appreciate ID recommendations. -Orthopedics consulted (left message to Dr. Marlou Sa to evaluate her foot ulcer given bacteremia) Continue supportive care. -PT eval ordered.   Type 1 diabetes mellitus Uncontrolled with last A1C of 9.7. On insulin pump.low FSG noted this morning. We'll continue to monitor.   Left lower leg and calf tenderness  Doppler lower extremity  ruled out DVT. We'll check MRI of the leg given persistent calf pain. Check CK.  Chronic kidney disease stage III Renal function at  baseline  Dyslipidemia Continue statin  DVT prophylaxis: Subcutaneous heparin  Code Status: Full code  Family Communication: None at bedside  Disposition Plan: Continue inpatient monitoring for now pending final blood cultures and orthopedic evaluation.Marland Kitchen Physical therapy evaluation pending.   Consultants:  none  Procedures:  MRI left foot  Antibiotics: IV vancomycin7/13 ceftazidime 7/13-7/15  HPI/Subjective: Patient seen and examined. Still has pain in her left calf.. Blood cx growing staph aureus Objective: Filed Vitals:   10/15/14 0613  BP: 141/73  Pulse: 70  Temp: 97.8 F (36.6 C)  Resp: 18    Intake/Output Summary (Last 24 hours) at 10/15/14 1338 Last data filed at 10/15/14 1120  Gross per 24 hour  Intake      0 ml  Output    700 ml  Net   -700 ml   Filed Weights   10/12/14 1622 10/12/14 2243  Weight: 61.236 kg (135 lb) 55.6 kg (122 lb 9.2 oz)    Exam:   General:   no acute distress  HEENT:  moist oral mucosa  Chest: Clear to auscultation bilaterally, no added sounds  CVS: Normal S1 and S2, no murmurs  GI: Soft, nondistended, nontender, bowel sounds present Musculoskeletal: Warm, no edema, tender to palpation over left calf, dressing over left foot, staples over  rt foot amputation removed CNS: Alert and oriented   Data Reviewed: Basic Metabolic Panel:  Recent Labs Lab 10/12/14 1632 10/13/14 0517  NA 133* 138  K 5.0 4.7  CL 103 105  CO2 22 23  GLUCOSE 324* 62*  BUN 40* 37*  CREATININE 1.69* 1.56*  CALCIUM 8.1* 8.3*   Liver Function Tests:  Recent Labs Lab 10/12/14 1632 10/13/14 0517  AST 37 25  ALT 94* 69*  ALKPHOS 146* 125  BILITOT 0.5 0.3  PROT 6.6 5.9*  ALBUMIN 3.0* 2.5*   No results for input(s): LIPASE, AMYLASE in the last 168 hours. No results for input(s): AMMONIA in the last 168 hours. CBC:  Recent Labs Lab 10/12/14 1632 10/13/14 0517  WBC 5.8 4.7  NEUTROABS 4.2 2.4  HGB 11.9* 11.1*  HCT 36.9 34.4*   MCV 88.5 88.9  PLT 278 274   Cardiac Enzymes: No results for input(s): CKTOTAL, CKMB, CKMBINDEX, TROPONINI in the last 168 hours. BNP (last 3 results) No results for input(s): BNP in the last 8760 hours.  ProBNP (last 3 results) No results for input(s): PROBNP in the last 8760 hours.  CBG:  Recent Labs Lab 10/14/14 1713 10/14/14 1756 10/14/14 2238 10/15/14 0202 10/15/14 0744  GLUCAP 48* 82 96 154* 189*    Recent Results (from the past 240 hour(s))  Culture, blood (routine x 2)     Status: None   Collection Time: 10/12/14  4:31 PM  Result Value Ref Range Status   Specimen Description BLOOD RIGHT ARM  Final   Special Requests BOTTLES DRAWN AEROBIC AND ANAEROBIC 5CC EACH  Final   Culture  Setup Time   Final    GRAM VARIABLE COCCI AEROBIC BOTTLE ONLY CRITICAL RESULT CALLED TO, READ BACK BY AND VERIFIED WITH: N DUNAS 10/13/14 @ 1555 M VESTAL    Culture   Final    METHICILLIN RESISTANT STAPHYLOCOCCUS AUREUS Performed at [facility]    Report Status 10/15/2014 FINAL  Final   Organism ID, Bacteria METHICILLIN RESISTANT STAPHYLOCOCCUS AUREUS  Final      Susceptibility   Methicillin resistant staphylococcus aureus - MIC*    CIPROFLOXACIN >=8 RESISTANT Resistant     ERYTHROMYCIN >=8 RESISTANT Resistant     GENTAMICIN <=0.5 SENSITIVE Sensitive     OXACILLIN >=4 RESISTANT Resistant     TETRACYCLINE <=1 SENSITIVE Sensitive     VANCOMYCIN 1 SENSITIVE Sensitive     TRIMETH/SULFA <=10 SENSITIVE Sensitive     CLINDAMYCIN >=8 RESISTANT Resistant     RIFAMPIN <=0.5 SENSITIVE Sensitive     Inducible Clindamycin NEGATIVE Sensitive     * METHICILLIN RESISTANT STAPHYLOCOCCUS AUREUS  Culture, blood (routine x 2)     Status: None (Preliminary result)   Collection Time: 10/12/14  5:06 PM  Result Value Ref Range Status   Specimen Description BLOOD LEFT ARM  Final   Special Requests BOTTLES DRAWN AEROBIC AND ANAEROBIC 5CC EACH  Final   Culture   Final    NO GROWTH 2  DAYS Performed at [facility]    Report Status PENDING  Incomplete  MRSA PCR Screening     Status: Abnormal   Collection Time: 10/13/14  1:05 AM  Result Value Ref Range Status   MRSA by PCR POSITIVE (A) NEGATIVE Final    Comment:        The GeneXpert MRSA Assay (FDA approved for NASAL specimens only), is one component of a comprehensive MRSA colonization surveillance program. It is not intended to diagnose MRSA infection nor to guide or monitor treatment for MRSA infections. RESULT CALLED TO, READ BACK BY AND VERIFIED WITH: CORE,Q/5E @0512  ON 10/13/14 BY KARCZEWSKI,S.   Blood Cultures x 2 sites     Status: None (Preliminary result)   Collection Time: 10/14/14  4:30 PM  Result Value Ref Range Status   Specimen Description BLOOD LEFT ARM  Final   Special Requests IN PEDIATRIC BOTTLE  [facility]  Final   Culture PENDING  Incomplete   Report Status PENDING  Incomplete  Blood Cultures x 2 sites     Status: None (Preliminary result)   Collection Time: 10/14/14  4:35 PM  Result Value Ref Range Status   Specimen Description BLOOD RIGHT HAND  Final   Special Requests IN PEDIATRIC BOTTLE  1CC  Final   Culture PENDING  Incomplete   Report Status PENDING  Incomplete     Studies: Mr Foot Left Wo Contrast  10/14/2014   CLINICAL DATA:  Plantar diabetic foot ulcer. Pain and blistering. Cellulitis.  EXAM: MRI OF THE LEFT FOREFOOT WITHOUT CONTRAST  TECHNIQUE: Multiplanar, multisequence MR imaging was performed. No intravenous contrast was administered.  COMPARISON:  Multiple exams, including 09/04/2014  FINDINGS: Plantar ulceration in skin disruption below the lateral portion of the Lisfranc joint as on image 11 of series 5 with surrounding edema in the subcutaneous tissues. The underlying peroneus longus tendon appears normal, and no drainable abscess is observed in the foot.  Regional subcutaneous edema tracks primarily along the plantar foot towards the toes, but also with some low-level  edema tracking around to the dorsal foot. Also low-level is the edema tracking within the plantar foot musculature.  No significant marrow edema in the forefoot, midfoot, or visualize part of the hindfoot to suggest osteomyelitis. There is some speckled T2 high signal hyperintensities in the bone suggesting osteoporosis of disuse.  The Lisfranc ligament appears intact. There appears to been fusion of the navicular, medial cuneiform, and probably the middle cuneiform. Pes planus noted.  IMPRESSION: 1. No abscess or osteomyelitis identified. 2. 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 3. Continued skin ulceration plantar to the Lisfranc joint laterally. Surrounding subcutaneous edema and cellulitis is primarily plantar with some mild dorsal extension. Low-level myositis along the plantar foot musculature.   Electronically Signed   By: Van Clines M.D.   On: 10/14/2014 07:43    Scheduled Meds: . calcitRIOL  0.25 mcg Oral Q M,W,F  . Chlorhexidine Gluconate Cloth  6 each Topical Q0600  . enoxaparin (LOVENOX) injection  40 mg Subcutaneous Q24H  . ferrous sulfate  325 mg Oral TID WC  . folic acid  1 mg Oral Daily  . insulin pump   Subcutaneous TID AC, HS, 0200  . mupirocin ointment   Nasal BID  . pravastatin  20 mg Oral q1800  . pyridoxine  100 mg Oral Daily  . sodium bicarbonate  650 mg Oral TID  . sodium chloride  3 mL Intravenous Q12H  . vancomycin  750 mg Intravenous Q24H  . vitamin B-12  200 mcg Oral Daily   Continuous Infusions:     Time spent: 25 minutes    Gilmer Kaminsky, [place]  [facility] Pager [phone]. If 7PM-7AM, please contact night-coverage at [email or web address], password [facility] 10/15/2014, 1:38 PM  LOS: 3 days

## 2014-10-15 NOTE — Consult Note (Signed)
Reason for Consult: Left foot infection and ulceration Referring Physician: Dr.Dhungel  [name] is an 44 y.o. female.  HPI: [name] is 44 year old female with several week history of left foot ulceration. She recently had right foot surgery done several months ago. Patient has diabetes which is poorly controlled. She is not really having any foot pain but she does report proximal calf pain. She has had a cath in the foot imaged. She does have some subcutaneous edema in the proximal calf region but nothing drainable with no abscess. MRI of the foot also is obtained and shows no osteomyelitis abscess but does show a little bit of myositis. She is currently admitted to the hospital and is being treated with an biotics for bacteremia and fevers.  Past Medical History  Diagnosis Date  . Diabetes mellitus   . Neuropathy in diabetes   . Hypertension   . Hypercholesteremia   . H/O seasonal allergies   . Anemia   . Left foot infection     Past Surgical History  Procedure Laterality Date  . Cesarean section    . Eye surgery    . Hemrrhoidectomy    . Peripherally inserted central catheter insertion    . I&d extremity Right 06/10/2014    Procedure: IRRIGATION AND DEBRIDEMENT Right Foot;  Surgeon: Newt Minion, MD;  Location: [facility];  Service: Orthopedics;  Laterality: Right;  . Skin split graft Right 06/15/2014    Procedure: SPLIT THICKNESS SKIN GRAFT RIGHT FOOT;  Surgeon: Newt Minion, MD;  Location: [facility];  Service: Orthopedics;  Laterality: Right;    Family History  Problem Relation Age of Onset  . Hyperlipidemia Mother     Social History:  reports that she quit smoking about 6 years ago. She has never used smokeless tobacco. She reports that she drinks about 0.5 oz of alcohol per week. She reports that she does not use illicit drugs.  Allergies:  Allergies  Allergen Reactions  . Ciprofloxacin Itching  . Cleocin [Clindamycin Hcl]     Diarrhea     Medications: I have  reviewed the patient's current medications.  Results for orders placed or performed during the hospital encounter of 10/12/14 (from the past 48 hour(s))  Glucose, capillary     Status: Abnormal   Collection Time: 10/13/14  9:50 PM  Result Value Ref Range   Glucose-Capillary 250 (H) 65 - 99 mg/dL  Glucose, capillary     Status: Abnormal   Collection Time: 10/14/14  6:07 AM  Result Value Ref Range   Glucose-Capillary 221 (H) 65 - 99 mg/dL   Comment 1 Notify RN    Comment 2 Document in Chart   Pregnancy, urine     Status: None   Collection Time: 10/14/14  6:58 AM  Result Value Ref Range   Preg Test, Ur NEGATIVE NEGATIVE    Comment:        THE SENSITIVITY OF THIS METHODOLOGY IS >20 mIU/mL.   Glucose, capillary     Status: Abnormal   Collection Time: 10/14/14  7:45 AM  Result Value Ref Range   Glucose-Capillary 207 (H) 65 - 99 mg/dL  Glucose, capillary     Status: Abnormal   Collection Time: 10/14/14  1:24 PM  Result Value Ref Range   Glucose-Capillary 230 (H) 65 - 99 mg/dL   Comment 1 Notify RN   Blood Cultures x 2 sites     Status: None (Preliminary result)   Collection Time: 10/14/14  4:30 PM  Result Value Ref Range   Specimen Description BLOOD LEFT ARM    Special Requests IN PEDIATRIC BOTTLE  2CC    Culture      NO GROWTH < 24 HOURS Performed at [facility]    Report Status PENDING   Blood Cultures x 2 sites     Status: None (Preliminary result)   Collection Time: 10/14/14  4:35 PM  Result Value Ref Range   Specimen Description BLOOD RIGHT HAND    Special Requests IN PEDIATRIC BOTTLE  1CC    Culture      NO GROWTH < 24 HOURS Performed at [facility]    Report Status PENDING   Hemoglobin A1c     Status: Abnormal   Collection Time: 10/14/14  4:38 PM  Result Value Ref Range   Hgb A1c MFr Bld 8.6 (H) 4.8 - 5.6 %    Comment: (NOTE)         Pre-diabetes: 5.7 - 6.4         Diabetes: >6.4         Glycemic control for adults with diabetes: <7.0    Mean  Plasma Glucose 200 mg/dL    Comment: (NOTE) Performed At: [facility] [address] [place], [place] [id number] Lindon Romp MD ZG:[id number]   HIV antibody     Status: None   Collection Time: 10/14/14  4:38 PM  Result Value Ref Range   HIV Screen 4th Generation wRfx Non Reactive Non Reactive    Comment: (NOTE) Performed At: [facility] [place], [place] [id number] Lindon Romp MD ZL:[id number]   Sedimentation rate     Status: Abnormal   Collection Time: 10/14/14  4:38 PM  Result Value Ref Range   Sed Rate 83 (H) 0 - 22 mm/hr  C-reactive protein     Status: Abnormal   Collection Time: 10/14/14  4:38 PM  Result Value Ref Range   CRP 8.1 (H) <1.0 mg/dL    Comment: Performed at [facility]  Prealbumin     Status: Abnormal   Collection Time: 10/14/14  4:38 PM  Result Value Ref Range   Prealbumin 16.4 (L) 18 - 38 mg/dL    Comment: Performed at [facility]  Glucose, capillary     Status: Abnormal   Collection Time: 10/14/14  5:13 PM  Result Value Ref Range   Glucose-Capillary 48 (L) 65 - 99 mg/dL  Glucose, capillary     Status: None   Collection Time: 10/14/14  5:56 PM  Result Value Ref Range   Glucose-Capillary 82 65 - 99 mg/dL  Glucose, capillary     Status: None   Collection Time: 10/14/14 10:38 PM  Result Value Ref Range   Glucose-Capillary 96 65 - 99 mg/dL  Glucose, capillary     Status: Abnormal   Collection Time: 10/15/14  2:02 AM  Result Value Ref Range   Glucose-Capillary 154 (H) 65 - 99 mg/dL  Glucose, capillary     Status: Abnormal   Collection Time: 10/15/14  7:44 AM  Result Value Ref Range   Glucose-Capillary 189 (H) 65 - 99 mg/dL  Glucose, capillary     Status: Abnormal   Collection Time: 10/15/14  3:24 PM  Result Value Ref Range   Glucose-Capillary 171 (H) 65 - 99 mg/dL  Vancomycin, trough     Status: None   Collection Time: 10/15/14  4:15 PM  Result Value Ref Range   Vancomycin Tr 18 10.0 -  20.0  ug/mL  Creatinine, serum     Status: Abnormal   Collection Time: 10/15/14  4:15 PM  Result Value Ref Range   Creatinine, Ser 1.34 (H) 0.44 - 1.00 mg/dL   GFR calc non Af Amer 48 (L) >60 mL/min   GFR calc Af Amer 56 (L) >60 mL/min    Comment: (NOTE) The eGFR has been calculated using the CKD EPI equation. This calculation has not been validated in all clinical situations. eGFR's persistently <60 mL/min signify possible Chronic Kidney Disease.   CK     Status: None   Collection Time: 10/15/14  4:15 PM  Result Value Ref Range   Total CK 46 38 - 234 U/L    Mr Tibia Fibula Left Wo Contrast  10/15/2014   CLINICAL DATA:  Posterior left mid calf pain. Diabetes. Evaluate for possible cellulitis. Initial encounter.  EXAM: MRI OF LOWER LEFT EXTREMITY WITHOUT CONTRAST  TECHNIQUE: Multiplanar, multisequence MR imaging of the left lower leg was performed. No intravenous contrast was administered.  COMPARISON:  Left foot MRI 10/13/2014.  FINDINGS: Study was performed using the body coil. Both lower legs are included on the axial and coronal images.  There is prominent asymmetric subcutaneous edema posteriorly in the proximal left lower leg. This abnormal T2 signal extends into the medial and lateral heads of the gastrocnemius muscle. The soleus muscle appears normal. There is no focal fluid collection. The visualized Achilles tendon appears normal.  More distally within the left lower leg, there is ill-defined T2 hyperintensity within the musculature of the deep posterior compartment. This abnormal signal surrounds the posterior tibial vein. The vein at this level has altered signal and appears mildly attenuated. DVT cannot be excluded. Minimal hyperintensity is noted within the contralateral deep posterior compartment musculature.  No osseous abnormalities are identified.  IMPRESSION: 1. There is both muscular and subcutaneous T2 signal abnormality in the left calf, likely posttraumatic or inflammatory. No  focal fluid collection or tendon tear identified. 2. Nonspecific lower level T2 hyperintensity more distally within the deep posterior compartment of both lower legs. On the left, this is perivascular in distribution and potentially secondary to underlying DVT. Subacute denervation would be an additional consideration. Doppler ultrasound correlation recommended. 3. No osseous abnormalities. 4. These results will be called to the ordering clinician or representative by the Radiologist Assistant, and communication documented in the PACS or zVision Dashboard.   Electronically Signed   By: Richardean Sale M.D.   On: 10/15/2014 15:15   Mr Foot Left Wo Contrast  10/14/2014   CLINICAL DATA:  Plantar diabetic foot ulcer. Pain and blistering. Cellulitis.  EXAM: MRI OF THE LEFT FOREFOOT WITHOUT CONTRAST  TECHNIQUE: Multiplanar, multisequence MR imaging was performed. No intravenous contrast was administered.  COMPARISON:  Multiple exams, including 09/04/2014  FINDINGS: Plantar ulceration in skin disruption below the lateral portion of the Lisfranc joint as on image 11 of series 5 with surrounding edema in the subcutaneous tissues. The underlying peroneus longus tendon appears normal, and no drainable abscess is observed in the foot.  Regional subcutaneous edema tracks primarily along the plantar foot towards the toes, but also with some low-level edema tracking around to the dorsal foot. Also low-level is the edema tracking within the plantar foot musculature.  No significant marrow edema in the forefoot, midfoot, or visualize part of the hindfoot to suggest osteomyelitis. There is some speckled T2 high signal hyperintensities in the bone suggesting osteoporosis of disuse.  The Lisfranc ligament appears intact. There appears  to been fusion of the navicular, medial cuneiform, and probably the middle cuneiform. Pes planus noted.  IMPRESSION: 1. No abscess or osteomyelitis identified. 2. Pes planus with apparent fusion of the  navicular, medial cuneiform, and probably the middle cuneiform. 3. Continued skin ulceration plantar to the Lisfranc joint laterally. Surrounding subcutaneous edema and cellulitis is primarily plantar with some mild dorsal extension. Low-level myositis along the plantar foot musculature.   Electronically Signed   By: Van Clines M.D.   On: 10/14/2014 07:43    Review of Systems  Constitutional: Positive for fever.  HENT: Negative.   Eyes: Negative.   Respiratory: Negative.   Cardiovascular: Negative.   Gastrointestinal: Negative.   Genitourinary: Negative.   Musculoskeletal: Positive for joint pain.  Skin: Negative.   Neurological: Negative.   Endo/Heme/Allergies: Negative.   Psychiatric/Behavioral: Negative.    Blood pressure 141/73, pulse 70, temperature 97.8 F (36.6 C), temperature source Oral, resp. rate 18, height $RemoveBe'5\' 7"'[id number]$  (1.702 m), weight 55.6 kg (122 lb 9.2 oz), SpO2 97 %. Physical Exam  Constitutional: She appears well-developed.  HENT:  Head: Normocephalic.  Eyes: Pupils are equal, round, and reactive to light.  Neck: Normal range of motion.  Cardiovascular: Normal rate.   Respiratory: Effort normal.  Neurological: She is alert.  Skin: Skin is warm.  Psychiatric: She has a normal mood and affect.   left foot is examined. She has a plantar ulcer partial skin thickness at the mid lateral portion of the foot. No active fluctuance purulence. There is a dry type dressing on top of the small ulceration. Not much in the way of tenderness to palpation around the foot she has pretty reasonable ankle dorsiflexion and plantar flexion. Diminished sensation dorsum plantar aspect of the foot foot is perfused pulses are palpable on the left-hand side cath is examined compartments are soft she does have tenderness and nodule about 1 x 1 cm and saphenous tissue between the medial lateral heads of the gastroc 1 handbreadth below the popliteal crease no fluctuance crepitus of the tissues in  this area. Knee has no effusion. Age of motion is full.  Assessment/Plan: Impression is left foot partial thickness 1 cm ulceration without surrounding osteomyelitis or abscess. No indications for surgery at this time. Continue with IV anabiotic's. She should follow-up with Dr. Sharol Given on release from the hospital. Please call if the clinical situation deteriorates.  [name] 10/15/2014, 6:25 PM

## 2014-10-15 NOTE — Progress Notes (Signed)
ANTIBIOTIC CONSULT NOTE - Follow-Up  Pharmacy Consult for vancomycin Indication: MRSA bacteremia, diabetic foot infection  Labs:  Recent Labs  10/13/14 0517 10/15/14 1615  WBC 4.7  --   HGB 11.1*  --   PLT 274  --   CREATININE 1.56* 1.34*   Estimated Creatinine Clearance: 48 mL/min (by C-G formula based on Cr of 1.34).  Recent Labs  10/15/14 1615  VANCOTROUGH 18     Antimicrobials: 7/12 >> vancomycin >> 7/13 >> ceftazidime >> 7/14  Assessment: 44 y/o F admitted with diabetic foot infection, r/o osteomyelitis. Orders received to begin empiric cefepime and vancomycin with pharmacy dosing assistance.   Blood cultures w/ MRSA  SCr improved (1.1 > 1.69 > 1.56 > 1.34) with CrCl ~ 48 ml/min  Vancomycin trough level = 18 (likely pre steady-state after 3 doses)  Trough level is therapeutic after only 3 doses, but may reflect higher loading dose and SCr is now decreasing.  Plan to continue current dose, but recheck renal function and trough levels as therapy continues.   Goal of Therapy:  Vancomycin trough level 15-20 mcg/ml   Plan:  1. Continue Vancomycin 750mg  IV q24h 2. Follow renal function closely (recheck SCr in AM), and recheck VT as needed. 3. Await further guidance from ID service regarding planned DOT.   Gretta Arab PharmD, BCPS Pager [phone] 10/15/2014 5:33 PM

## 2014-10-15 NOTE — Progress Notes (Signed)
ANTICOAGULATION CONSULT NOTE - Initial Consult  Pharmacy Consult for Xarelto Indication: DVT  Allergies  Allergen Reactions  . Ciprofloxacin Itching  . Cleocin [Clindamycin Hcl]     Diarrhea     Patient Measurements: Height: 5\' 7"  (170.2 cm) Weight: 122 lb 9.2 oz (55.6 kg) IBW/kg (Calculated) : 61.6  Vital Signs:    Labs:  Recent Labs  10/13/14 0517 10/15/14 1615  HGB 11.1*  --   HCT 34.4*  --   PLT 274  --   CREATININE 1.56* 1.34*  CKTOTAL  --  46    Estimated Creatinine Clearance: 48 mL/min (by C-G formula based on Cr of 1.34).   Medical History: Past Medical History  Diagnosis Date  . Diabetes mellitus   . Neuropathy in diabetes   . Hypertension   . Hypercholesteremia   . H/O seasonal allergies   . Anemia   . Left foot infection     Medications:  Scheduled:  . calcitRIOL  0.25 mcg Oral Q M,W,F  . Chlorhexidine Gluconate Cloth  6 each Topical Q0600  . enoxaparin (LOVENOX) injection  40 mg Subcutaneous Q24H  . ferrous sulfate  325 mg Oral TID WC  . folic acid  1 mg Oral Daily  . insulin pump   Subcutaneous TID AC, HS, 0200  . mupirocin ointment   Nasal BID  . pravastatin  20 mg Oral q1800  . pyridoxine  100 mg Oral Daily  . sodium bicarbonate  650 mg Oral TID  . sodium chloride  3 mL Intravenous Q12H  . vancomycin  750 mg Intravenous Q24H  . vitamin B-12  200 mcg Oral Daily   Infusions:    Assessment: 44 y/o F admitted with diabetic foot infection, r/o osteomyelitis. Initially started on Lovenox for VTE prophylaxis, but now MRI of LLE concerning for DVT with clinical symptoms.  Pharmacy is consulted to begin Xarelto.  Today, 10/15/2014  Last Lovenox dose 7/15 at 0936  CBC: Hgb 11.1, Plt 274 (last on 7/13)  Renal function: elevated but improving, SCr 1.34 with CrCl ~ 48 ml/min  Goal of Therapy:  Xarelto per renal function Monitor platelets by anticoagulation protocol: Yes   Plan:   Xarelto 15 mg PO BID with meals x21 days followed by  20mg  PO once daily with the evening meal.  Continue to f/u renal function  Pharmacy to provide patient education prior to discharge.  Gretta Arab PharmD, BCPS Pager [phone] 10/15/2014 7:56 PM

## 2014-10-15 NOTE — Progress Notes (Addendum)
[facility] Infect[facility] Disease  Date of Admission:  10/12/2014  Antibiotics: vancomycin  Subjective: Currently in MRI  Objective: Temp:  [97.8 F (36.6 C)-98.6 F (37 C)] 97.8 F (36.6 C) (07/15 0613) Pulse Rate:  [70-84] 70 (07/15 0613) Resp:  [18] 18 (07/15 0613) BP: (140-145)/(73-74) 141/73 mmHg (07/15 0613) SpO2:  [96 %-100 %] 97 % (07/15 0613)    Lab Results Lab Results  Component Value Date   WBC 4.7 10/13/2014   HGB 11.1* 10/13/2014   HCT 34.4* 10/13/2014   MCV 88.9 10/13/2014   PLT 274 10/13/2014    Lab Results  Component Value Date   CREATININE 1.56* 10/13/2014   BUN 37* 10/13/2014   NA 138 10/13/2014   K 4.7 10/13/2014   CL 105 10/13/2014   CO2 23 10/13/2014    Lab Results  Component Value Date   ALT 69* 10/13/2014   AST 25 10/13/2014   ALKPHOS 125 10/13/2014   BILITOT 0.3 10/13/2014      Microbiology: Recent Results (from the past 240 hour(s))  Culture, blood (routine x 2)     Status: None   Collection Time: 10/12/14  4:31 PM  Result Value Ref Range Status   Specimen Description BLOOD RIGHT ARM  Final   Special Requests BOTTLES DRAWN AEROBIC AND ANAEROBIC 5CC EACH  Final   Culture  Setup Time   Final    GRAM VARIABLE COCCI AEROBIC BOTTLE ONLY CRITICAL RESULT CALLED TO, READ BACK BY AND VERIFIED WITH: N DUNAS 10/13/14 @ 1555 M VESTAL    Culture   Final    METHICILLIN RESISTANT STAPHYLOCOCCUS AUREUS Performed at [facility]    Report Status 10/15/2014 FINAL  Final   Organism ID, Bacteria METHICILLIN RESISTANT STAPHYLOCOCCUS AUREUS  Final      Susceptibility   Methicillin resistant staphylococcus aureus - MIC*    CIPROFLOXACIN >=8 RESISTANT Resistant     ERYTHROMYCIN >=8 RESISTANT Resistant     GENTAMICIN <=0.5 SENSITIVE Sensitive     OXACILLIN >=4 RESISTANT Resistant     TETRACYCLINE <=1 SENSITIVE Sensitive     VANCOMYCIN 1 SENSITIVE Sensitive     TRIMETH/SULFA <=10 SENSITIVE Sensitive     CLINDAMYCIN >=8 RESISTANT  Resistant     RIFAMPIN <=0.5 SENSITIVE Sensitive     Inducible Clindamycin NEGATIVE Sensitive     * METHICILLIN RESISTANT STAPHYLOCOCCUS AUREUS  Culture, blood (routine x 2)     Status: None (Preliminary result)   Collection Time: 10/12/14  5:06 PM  Result Value Ref Range Status   Specimen Description BLOOD LEFT ARM  Final   Special Requests BOTTLES DRAWN AEROBIC AND ANAEROBIC 5CC EACH  Final   Culture   Final    NO GROWTH 2 DAYS Performed at [facility]    Report Status PENDING  Incomplete  MRSA PCR Screening     Status: Abnormal   Collection Time: 10/13/14  1:05 AM  Result Value Ref Range Status   MRSA by PCR POSITIVE (A) NEGATIVE Final    Comment:        The GeneXpert MRSA Assay (FDA approved for NASAL specimens only), is one component of a comprehensive MRSA colonization surveillance program. It is not intended to diagnose MRSA infection nor to guide or monitor treatment for MRSA infections. RESULT CALLED TO, READ BACK BY AND VERIFIED WITH: CORE,Q/5E @0512  ON 10/13/14 BY KARCZEWSKI,S.   Blood Cultures x 2 sites     Status: None (Preliminary result)   Collection Time: 10/14/14  4:30 PM  Result Value Ref Range Status   Specimen Description BLOOD LEFT ARM  Final   Special Requests IN PEDIATRIC BOTTLE  2CC  Final   Culture PENDING  Incomplete   Report Status PENDING  Incomplete  Blood Cultures x 2 sites     Status: None (Preliminary result)   Collection Time: 10/14/14  4:35 PM  Result Value Ref Range Status   Specimen Description BLOOD RIGHT HAND  Final   Special Requests IN PEDIATRIC BOTTLE  1CC  Final   Culture PENDING  Incomplete   Report Status PENDING  Incomplete    Studies/Results: Mr Foot Left Wo Contrast  10/14/2014   CLINICAL DATA:  Plantar diabetic foot ulcer. Pain and blistering. Cellulitis.  EXAM: MRI OF THE LEFT FOREFOOT WITHOUT CONTRAST  TECHNIQUE: Multiplanar, multisequence MR imaging was performed. No intravenous contrast was administered.   COMPARISON:  Multiple exams, including 09/04/2014  FINDINGS: Plantar ulceration in skin disruption below the lateral portion of the Lisfranc joint as on image 11 of series 5 with surrounding edema in the subcutaneous tissues. The underlying peroneus longus tendon appears normal, and no drainable abscess is observed in the foot.  Regional subcutaneous edema tracks primarily along the plantar foot towards the toes, but also with some low-level edema tracking around to the dorsal foot. Also low-level is the edema tracking within the plantar foot musculature.  No significant marrow edema in the forefoot, midfoot, or visualize part of the hindfoot to suggest osteomyelitis. There is some speckled T2 high signal hyperintensities in the bone suggesting osteoporosis of disuse.  The Lisfranc ligament appears intact. There appears to been fusion of the navicular, medial cuneiform, and probably the middle cuneiform. Pes planus noted.  IMPRESSION: 1. No abscess or osteomyelitis identified. 2. 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 3. Continued skin ulceration plantar to the Lisfranc joint laterally. Surrounding subcutaneous edema and cellulitis is primarily plantar with some mild dorsal extension. Low-level myositis along the plantar foot musculature.   Electronically Signed   By: Van Clines M.D.   On: 10/14/2014 07:43    Assessment/Plan:  1) MRSA bacteremia - on vancomycin.  TTE without vegetation.  Repeat blood cultures sent.  Will need at least 2 weeks IV vancomycin.  Would do TEE.  MRI pending.   Dr. Megan Salon will be available over the weekend if needed.    Scharlene Gloss, [place] for Infectious Disease [place] [email or web address] [phone] pager   [phone] cell 10/15/2014, 2:11 PM

## 2014-10-15 NOTE — Progress Notes (Signed)
VASCULAR LAB PRELIMINARY  ARTERIAL  ABI completed: Right ABI is non-compressible. Left ABI pressures are falsely elevated.    RIGHT    LEFT    PRESSURE WAVEFORM  PRESSURE WAVEFORM  BRACHIAL 148 Tri BRACHIAL 145 Tri  DP   DP    AT >255 Tri AT 184 Tri  PT 204 Tri PT 171 Tri  PER   PER    GREAT TOE  NA GREAT TOE  NA    RIGHT LEFT  ABI Non comp Falsely elevated at 1.24     Landry Mellow, RDMS, RVT  10/15/2014, 10:00 AM

## 2014-10-15 NOTE — Progress Notes (Signed)
  Echocardiogram 2D Echocardiogram has been performed.  Bobbye Charleston 10/15/2014, 10:48 AM

## 2014-10-16 ENCOUNTER — Inpatient Hospital Stay (HOSPITAL_COMMUNITY): Payer: Medicare Other

## 2014-10-16 DIAGNOSIS — E119 Type 2 diabetes mellitus without complications: Secondary | ICD-10-CM

## 2014-10-16 DIAGNOSIS — I8011 Phlebitis and thrombophlebitis of right femoral vein: Secondary | ICD-10-CM

## 2014-10-16 DIAGNOSIS — B9561 Methicillin susceptible Staphylococcus aureus infection as the cause of diseases classified elsewhere: Secondary | ICD-10-CM

## 2014-10-16 DIAGNOSIS — I82409 Acute embolism and thrombosis of unspecified deep veins of unspecified lower extremity: Secondary | ICD-10-CM

## 2014-10-16 LAB — BASIC METABOLIC PANEL
ANION GAP: 8 (ref 5–15)
BUN: 47 mg/dL — ABNORMAL HIGH (ref 6–20)
CO2: 18 mmol/L — AB (ref 22–32)
Calcium: 8 mg/dL — ABNORMAL LOW (ref 8.9–10.3)
Chloride: 107 mmol/L (ref 101–111)
Creatinine, Ser: 1.41 mg/dL — ABNORMAL HIGH (ref 0.44–1.00)
GFR calc Af Amer: 52 mL/min — ABNORMAL LOW (ref >60)
GFR calc non Af Amer: 45 mL/min — ABNORMAL LOW (ref >60)
Glucose, Bld: 445 mg/dL — ABNORMAL HIGH (ref 65–99)
Potassium: 5.5 mmol/L — ABNORMAL HIGH (ref 3.5–5.1)
Sodium: 133 mmol/L — ABNORMAL LOW (ref 135–145)

## 2014-10-16 LAB — GLUCOSE, CAPILLARY
GLUCOSE-CAPILLARY: 193 mg/dL — AB (ref 65–99)
Glucose-Capillary: 181 mg/dL — ABNORMAL HIGH (ref 65–99)
Glucose-Capillary: 238 mg/dL — ABNORMAL HIGH (ref 65–99)
Glucose-Capillary: 251 mg/dL — ABNORMAL HIGH (ref 65–99)
Glucose-Capillary: 329 mg/dL — ABNORMAL HIGH (ref 65–99)
Glucose-Capillary: 425 mg/dL — ABNORMAL HIGH (ref 65–99)
Glucose-Capillary: 47 mg/dL — ABNORMAL LOW (ref 65–99)
Glucose-Capillary: 547 mg/dL — ABNORMAL HIGH (ref 65–99)
Glucose-Capillary: 59 mg/dL — ABNORMAL LOW (ref 65–99)
Glucose-Capillary: 87 mg/dL (ref 65–99)

## 2014-10-16 LAB — CLOSTRIDIUM DIFFICILE BY PCR: Toxigenic C. Difficile by PCR: POSITIVE — AB

## 2014-10-16 MED ORDER — SODIUM POLYSTYRENE SULFONATE 15 GM/60ML PO SUSP
30.0000 g | Freq: Once | ORAL | Status: AC
  Administered 2014-10-16: 30 g via ORAL
  Filled 2014-10-16: qty 120

## 2014-10-16 MED ORDER — VANCOMYCIN 50 MG/ML ORAL SOLUTION
125.0000 mg | Freq: Four times a day (QID) | ORAL | Status: DC
  Administered 2014-10-16 – 2014-10-18 (×9): 125 mg via ORAL
  Filled 2014-10-16 (×11): qty 2.5

## 2014-10-16 NOTE — Progress Notes (Signed)
PT Cancellation Note  Patient Details Name: [name] MRN: [id number] DOB: 10/22/1970   Cancelled Treatment:    Reason Eval/Treat Not Completed: Pain limiting ability to participate (pt reports pain due to superficial thrombosis (noted in the left lesser saphenous vein from mid calf to distal thigh))  Pt declines PT today due to increased leg pain however agreeable for therapy to check back another day.  [place],KATHrine E 10/16/2014, 10:48 AM Carmelia Bake, PT, DPT 10/16/2014 Pager: [phone]

## 2014-10-16 NOTE — Discharge Instructions (Signed)
Information on my medicine - XARELTO (rivaroxaban)  This medication education was reviewed with me or my healthcare representative as part of my discharge preparation.  The pharmacist that spoke with me during my hospital stay was:  Clovis Riley, [place]? Xarelto was prescribed to treat blood clots that may have been found in the veins of your legs (deep vein thrombosis) or in your lungs (pulmonary embolism) and to reduce the risk of them occurring again.  What do you need to know about Xarelto? The starting dose is one 15 mg tablet taken TWICE daily with food for the FIRST 21 DAYS then on Saturday November 06, 2014  the dose is changed to one 20 mg tablet taken ONCE A DAY with your evening meal. Day #1 of xarelto 15mg  twice daily dosing was Saturday October 16, 2014  DO NOT stop taking Xarelto without talking to the health care provider who prescribed the medication.  Refill your prescription for 20 mg tablets before you run out.  After discharge, you should have regular check-up appointments with your healthcare provider that is prescribing your Xarelto.  In the future your dose may need to be changed if your kidney function changes by a significant amount.  What do you do if you miss a dose? If you are taking Xarelto TWICE DAILY and you miss a dose, take it as soon as you remember. You may take two 15 mg tablets (total 30 mg) at the same time then resume your regularly scheduled 15 mg twice daily the next day.  If you are taking Xarelto ONCE DAILY and you miss a dose, take it as soon as you remember on the same day then continue your regularly scheduled once daily regimen the next day. Do not take two doses of Xarelto at the same time.   Important Safety Information Xarelto is a blood thinner medicine that can cause bleeding. You should call your healthcare provider right away if you experience any of the following: ? Bleeding from an injury or  your nose that does not stop. ? Unusual colored urine (red or dark brown) or unusual colored stools (red or black). ? Unusual bruising for unknown reasons. ? A serious fall or if you hit your head (even if there is no bleeding).  Some medicines may interact with Xarelto and might increase your risk of bleeding while on Xarelto. To help avoid this, consult your healthcare provider or pharmacist prior to using any new prescription or non-prescription medications, including herbals, vitamins, non-steroidal anti-inflammatory drugs (NSAIDs) and supplements.  This website has more information on Xarelto: [email or web address]

## 2014-10-16 NOTE — Progress Notes (Signed)
Patient ID: [name], female   DOB: 1970/06/14, 44 y.o.   MRN: [id number]         [facility] for Infectious Disease    Date of Admission:  10/12/2014           Day 5 vancomycin  Principal Problem:   Cellulitis of left foot Active Problems:   CKD (chronic kidney disease), stage III   Cellulitis   Diabetic foot ulcer   Type 1 diabetes mellitus with hyperglycemia   Chronic anemia   Staphylococcus aureus bacteremia   . calcitRIOL  0.25 mcg Oral Q M,W,F  . Chlorhexidine Gluconate Cloth  6 each Topical Q0600  . ferrous sulfate  325 mg Oral TID WC  . folic acid  1 mg Oral Daily  . insulin pump   Subcutaneous TID AC, HS, 0200  . mupirocin ointment   Nasal BID  . pravastatin  20 mg Oral q1800  . pyridoxine  100 mg Oral Daily  . Rivaroxaban  15 mg Oral BID WC  . sodium bicarbonate  650 mg Oral TID  . sodium chloride  3 mL Intravenous Q12H  . sodium polystyrene  30 g Oral Once  . vancomycin  750 mg Intravenous Q24H  . vitamin B-12  200 mcg Oral Daily    OBJECTIVE: Blood pressure 145/80, pulse 86, temperature 98.5 F (36.9 C), temperature source Oral, resp. rate 18, height _0  (1.702 m), weight 122 lb 9.2 oz (55.6 kg), SpO2 99 %.  Lab Results Lab Results  Component Value Date   WBC 4.7 10/13/2014   HGB 11.1* 10/13/2014   HCT 34.4* 10/13/2014   MCV 88.9 10/13/2014   PLT 274 10/13/2014    Lab Results  Component Value Date   CREATININE 1.41* 10/16/2014   BUN 47* 10/16/2014   NA 133* 10/16/2014   K 5.5* 10/16/2014   CL 107 10/16/2014   CO2 18* 10/16/2014    Lab Results  Component Value Date   ALT 69* 10/13/2014   AST 25 10/13/2014   ALKPHOS 125 10/13/2014   BILITOT 0.3 10/13/2014     Microbiology: Recent Results (from the past 240 hour(s))  Culture, blood (routine x 2)     Status: None   Collection Time: 10/12/14  4:31 PM  Result Value Ref Range Status   Specimen Description BLOOD RIGHT ARM  Final   Special Requests BOTTLES DRAWN AEROBIC AND  ANAEROBIC 5CC EACH  Final   Culture  Setup Time   Final    GRAM VARIABLE COCCI AEROBIC BOTTLE ONLY CRITICAL RESULT CALLED TO, READ BACK BY AND VERIFIED WITH: N DUNAS 10/13/14 @ 1555 M VESTAL    Culture   Final    METHICILLIN RESISTANT STAPHYLOCOCCUS AUREUS Performed at [facility]    Report Status 10/15/2014 FINAL  Final   Organism ID, Bacteria METHICILLIN RESISTANT STAPHYLOCOCCUS AUREUS  Final      Susceptibility   Methicillin resistant staphylococcus aureus - MIC*    CIPROFLOXACIN >=8 RESISTANT Resistant     ERYTHROMYCIN >=8 RESISTANT Resistant     GENTAMICIN <=0.5 SENSITIVE Sensitive     OXACILLIN >=4 RESISTANT Resistant     TETRACYCLINE <=1 SENSITIVE Sensitive     VANCOMYCIN 1 SENSITIVE Sensitive     TRIMETH/SULFA <=10 SENSITIVE Sensitive     CLINDAMYCIN >=8 RESISTANT Resistant     RIFAMPIN <=0.5 SENSITIVE Sensitive     Inducible Clindamycin NEGATIVE Sensitive     * METHICILLIN RESISTANT STAPHYLOCOCCUS AUREUS  Culture, blood (routine x  2)     Status: None (Preliminary result)   Collection Time: 10/12/14  5:06 PM  Result Value Ref Range Status   Specimen Description BLOOD LEFT ARM  Final   Special Requests BOTTLES DRAWN AEROBIC AND ANAEROBIC 5CC EACH  Final   Culture   Final    NO GROWTH 3 DAYS Performed at [facility]    Report Status PENDING  Incomplete  MRSA PCR Screening     Status: Abnormal   Collection Time: 10/13/14  1:05 AM  Result Value Ref Range Status   MRSA by PCR POSITIVE (A) NEGATIVE Final    Comment:        The GeneXpert MRSA Assay (FDA approved for NASAL specimens only), is one component of a comprehensive MRSA colonization surveillance program. It is not intended to diagnose MRSA infection nor to guide or monitor treatment for MRSA infections. RESULT CALLED TO, READ BACK BY AND VERIFIED WITH: CORE,Q/5E _0  ON 10/13/14 BY KARCZEWSKI,S.   Blood Cultures x 2 sites     Status: None (Preliminary result)   Collection Time: 10/14/14   4:30 PM  Result Value Ref Range Status   Specimen Description BLOOD LEFT ARM  Final   Special Requests IN PEDIATRIC BOTTLE  [place]  Final   Culture   Final    NO GROWTH < 24 HOURS Performed at [facility]    Report Status PENDING  Incomplete  Blood Cultures x 2 sites     Status: None (Preliminary result)   Collection Time: 10/14/14  4:35 PM  Result Value Ref Range Status   Specimen Description BLOOD RIGHT HAND  Final   Special Requests IN PEDIATRIC BOTTLE  [place]  Final   Culture   Final    NO GROWTH < 24 HOURS Performed at [facility]    Report Status PENDING  Incomplete    Assessment: Ms. Bonzo had transient MRSA bacteremia associated with acute left leg cellulitis related to her chronic left foot ulcer. She has no evidence of endocarditis by transthoracic echocardiogram. She will need several weeks (2-3) of IV vancomycin with optimal duration based on her clinical progress. The role of transesophageal echocardiogram in the evaluation of staph aureus bacteremia remains somewhat controversial. The Up-To-Date chapter on staph aureus bacteremia and notes:   "It may be reasonable to forgo TEE for circumstances in which all of the following conditions are met [17,29-31]: ?Nosocomial acquisition of bacteremia ?Sterile follow-up blood cultures within four days after the initial positive culture ?No permanent intracardiac device ?No hemodialysis dependence ?No clinical signs of endocarditis or secondary foci of infection ?Removable focus of infection removed promptly, if present ?Defervescence within 72 hours of initial positive blood culture"  Given that only one of her initial 2 sets of blood cultures were positive and followup cultures are negative at 48 hours it may be reasonable to hold off on TEE.   Plan: 1. Continue vancomycin 2. Await results of final blood cultures 3. We will followup on Monday, 10/18/2014  Michel Bickers, MD [facility] for [facility] [place] [facility] [phone] pager   [phone] cell 10/16/2014, 11:07 AM

## 2014-10-16 NOTE — Progress Notes (Signed)
CRITICAL VALUE ALERT  Critical value received:  Positive c diff  Date of notification:  10/16/14  Time of notification: [id number]  Critical value read back:Yes.    Nurse who received alert:  Hale Bogus.  MD notified (1st page):  Dr. Clementeen Graham  Time of first page: 1856  MD notified (2nd page):  Time of second page:  Responding MD:  Dr. Clementeen Graham  Time MD responded:  [phone]

## 2014-10-16 NOTE — Progress Notes (Signed)
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 After returning home patient developed fever with chills and pain in her left leg. She also noticed blister over the left foot. X-ray done in the ED was negative for ostiomyelitis of the left foot. Patient had low-grade fever on presentation and admitted for cellulitis of the left foot . MRI negative for osteomyelitis but concerning for DVT in left lower leg.   Assessment/Plan: Left lower leg cellulitis with diabetic foot ulcer and MRSA bacteremia Continue empiric vancomycin. Discontinued ceftazidime. MRI of the left foot  negative for abscess or osteomyelitis and shows area of cellulitis.  -2-D echo negative for vegetation. Repeat blood culture without any growth.  Will discuss antibiotic course and need for TEE with ID. -Pain control with when necessary morphine and Toradol. Wound care consult appreciated. Continue daily dressing. -Orthopedics consulted. Recommends no surgical needs an outpatient follow-up with Dr. Sharol Given. (Patient wishes to follow-up with her surgeon and Mikel Cella) Continue supportive care. -PT eval   Type 1 brittle diabetes mellitus  Uncontrolled with last A1C of 8.6.. On insulin pump.inpatient having episodes of hypo-and hyperglycemia. Instructed to reduce her insulin bolus will avoid hypoglycemic episodes.   Left lower leg DVT As seen on MRI of the leg for persistent left calf pain. Initial Doppler was negative for DVT which was repeated again this  morning and she just left lower extremity DVT. Started patient on Xarelto. Will treat for 6 months.  Chronic kidney disease stage III Renal function at baseline  Hyperkalemia Will order Kayexalate.   Dyslipidemia Continue statin  DVT prophylaxis: Subcutaneous heparin  Code Status: Full code  Family Communication: None at bedside  Disposition Plan: Continue inpatient monitoring for now pending final blood cultures and orthopedic evaluation.Marland Kitchen Physical therapy evaluation pending.   Consultants:  none  Procedures:  MRI left foot  Antibiotics: IV vancomycin7/13 ceftazidime 7/13-7/15  HPI/Subjective: Patient seen and examined. Having low blood glucose this morning and given apple juice and subsequently blood glucose was elevated to 500s. Still having left calf pain which is better today.  Objective: Filed Vitals:   10/16/14 0604  BP: 145/80  Pulse: 86  Temp: 98.5 F (36.9 C)  Resp: 18    Intake/Output Summary (Last 24 hours) at 10/16/14 1044 Last data filed at 10/16/14 0615  Gross per 24 hour  Intake    660 ml  Output    700 ml  Net    -40 ml   Filed Weights   10/12/14 1622 10/12/14 2243  Weight: 61.236 kg (135 lb) 55.6 kg (122 lb 9.2 oz)    Exam:   General:   no acute distress  HEENT:  moist oral mucosa  Chest: Clear to auscultation bilaterally, no added sounds  CVS: Normal S1 and S2, no murmurs  GI: Soft, nondistended, nontender, bowel sounds present Musculoskeletal: Warm, no edema, tender to palpation over left calf, dressing over left foot, staples over  rt foot amputation removed CNS: Alert and oriented   Data Reviewed: Basic Metabolic Panel:  Recent Labs Lab 10/12/14 1632 10/13/14 0517 10/15/14 1615 10/16/14  0538  NA 133* 138  --  133*  K 5.0 4.7  --  5.5*  CL 103 105  --  107  CO2 22 23  --  18*  GLUCOSE 324* 62*  --  445*  BUN 40* 37*  --  47*  CREATININE 1.69* 1.56* 1.34* 1.41*  CALCIUM 8.1* 8.3*  --  8.0*   Liver Function  Tests:  Recent Labs Lab 10/12/14 1632 10/13/14 0517  AST 37 25  ALT 94* 69*  ALKPHOS 146* 125  BILITOT 0.5 0.3  PROT 6.6 5.9*  ALBUMIN 3.0* 2.5*   No results for input(s): LIPASE, AMYLASE in the last 168 hours. No results for input(s): AMMONIA in the last 168 hours. CBC:  Recent Labs Lab 10/12/14 1632 10/13/14 0517  WBC 5.8 4.7  NEUTROABS 4.2 2.4  HGB 11.9* 11.1*  HCT 36.9 34.4*  MCV 88.5 88.9  PLT 278 274   Cardiac Enzymes:  Recent Labs Lab 10/15/14 1615  CKTOTAL 46   BNP (last 3 results) No results for input(s): BNP in the last 8760 hours.  ProBNP (last 3 results) No results for input(s): PROBNP in the last 8760 hours.  CBG:  Recent Labs Lab 10/16/14 0024 10/16/14 0054 10/16/14 0226 10/16/14 0743 10/16/14 0935  GLUCAP 59* 87 181* 547* 425*    Recent Results (from the past 240 hour(s))  Culture, blood (routine x 2)     Status: None   Collection Time: 10/12/14  4:31 PM  Result Value Ref Range Status   Specimen Description BLOOD RIGHT ARM  Final   Special Requests BOTTLES DRAWN AEROBIC AND ANAEROBIC 5CC EACH  Final   Culture  Setup Time   Final    GRAM VARIABLE COCCI AEROBIC BOTTLE ONLY CRITICAL RESULT CALLED TO, READ BACK BY AND VERIFIED WITH: N DUNAS 10/13/14 @ 1555 M VESTAL    Culture   Final    METHICILLIN RESISTANT STAPHYLOCOCCUS AUREUS Performed at [facility]    Report Status 10/15/2014 FINAL  Final   Organism ID, Bacteria METHICILLIN RESISTANT STAPHYLOCOCCUS AUREUS  Final      Susceptibility   Methicillin resistant staphylococcus aureus - MIC*    CIPROFLOXACIN >=8 RESISTANT Resistant     ERYTHROMYCIN >=8 RESISTANT Resistant     GENTAMICIN <=0.5 SENSITIVE Sensitive     OXACILLIN >=4 RESISTANT Resistant     TETRACYCLINE <=1 SENSITIVE Sensitive     VANCOMYCIN 1 SENSITIVE Sensitive     TRIMETH/SULFA <=10 SENSITIVE Sensitive     CLINDAMYCIN >=8 RESISTANT Resistant     RIFAMPIN <=0.5 SENSITIVE Sensitive     Inducible Clindamycin  NEGATIVE Sensitive     * METHICILLIN RESISTANT STAPHYLOCOCCUS AUREUS  Culture, blood (routine x 2)     Status: None (Preliminary result)   Collection Time: 10/12/14  5:06 PM  Result Value Ref Range Status   Specimen Description BLOOD LEFT ARM  Final   Special Requests BOTTLES DRAWN AEROBIC AND ANAEROBIC 5CC EACH  Final   Culture   Final    NO GROWTH 3 DAYS Performed at [facility]    Report Status PENDING  Incomplete  MRSA PCR Screening     Status: Abnormal   Collection Time: 10/13/14  1:05 AM  Result Value Ref Range Status   MRSA by PCR POSITIVE (A) NEGATIVE Final    Comment:        The GeneXpert MRSA Assay (FDA approved for NASAL specimens only), is one component of a comprehensive MRSA colonization surveillance program. It  is not intended to diagnose MRSA infection nor to guide or monitor treatment for MRSA infections. RESULT CALLED TO, READ BACK BY AND VERIFIED WITH: CORE,Q/5E @0512  ON 10/13/14 BY KARCZEWSKI,S.   Blood Cultures x 2 sites     Status: None (Preliminary result)   Collection Time: 10/14/14  4:30 PM  Result Value Ref Range Status   Specimen Description BLOOD LEFT ARM  Final   Special Requests IN PEDIATRIC BOTTLE  2CC  Final   Culture   Final    NO GROWTH < 24 HOURS Performed at [facility]    Report Status PENDING  Incomplete  Blood Cultures x 2 sites     Status: None (Preliminary result)   Collection Time: 10/14/14  4:35 PM  Result Value Ref Range Status   Specimen Description BLOOD RIGHT HAND  Final   Special Requests IN PEDIATRIC BOTTLE  [place]  Final   Culture   Final    NO GROWTH < 24 HOURS Performed at [facility]    Report Status PENDING  Incomplete     Studies: Mr Tibia Fibula Left Wo Contrast  10/15/2014   CLINICAL DATA:  Posterior left mid calf pain. Diabetes. Evaluate for possible cellulitis. Initial encounter.  EXAM: MRI OF LOWER LEFT EXTREMITY WITHOUT CONTRAST  TECHNIQUE: Multiplanar, multisequence MR imaging of  the left lower leg was performed. No intravenous contrast was administered.  COMPARISON:  Left foot MRI 10/13/2014.  FINDINGS: Study was performed using the body coil. Both lower legs are included on the axial and coronal images.  There is prominent asymmetric subcutaneous edema posteriorly in the proximal left lower leg. This abnormal T2 signal extends into the medial and lateral heads of the gastrocnemius muscle. The soleus muscle appears normal. There is no focal fluid collection. The visualized Achilles tendon appears normal.  More distally within the left lower leg, there is ill-defined T2 hyperintensity within the musculature of the deep posterior compartment. This abnormal signal surrounds the posterior tibial vein. The vein at this level has altered signal and appears mildly attenuated. DVT cannot be excluded. Minimal hyperintensity is noted within the contralateral deep posterior compartment musculature.  No osseous abnormalities are identified.  IMPRESSION: 1. There is both muscular and subcutaneous T2 signal abnormality in the left calf, likely posttraumatic or inflammatory. No focal fluid collection or tendon tear identified. 2. Nonspecific lower level T2 hyperintensity more distally within the deep posterior compartment of both lower legs. On the left, this is perivascular in distribution and potentially secondary to underlying DVT. Subacute denervation would be an additional consideration. Doppler ultrasound correlation recommended. 3. No osseous abnormalities. 4. These results will be called to the ordering clinician or representative by the Radiologist Assistant, and communication documented in the PACS or zVision Dashboard.   Electronically Signed   By: Richardean Sale M.D.   On: 10/15/2014 15:15    Scheduled Meds: . calcitRIOL  0.25 mcg Oral Q M,W,F  . Chlorhexidine Gluconate Cloth  6 each Topical Q0600  . ferrous sulfate  325 mg Oral TID WC  . folic acid  1 mg Oral Daily  . insulin pump    Subcutaneous TID AC, HS, 0200  . mupirocin ointment   Nasal BID  . pravastatin  20 mg Oral q1800  . pyridoxine  100 mg Oral Daily  . Rivaroxaban  15 mg Oral BID WC  . sodium bicarbonate  650 mg Oral TID  . sodium chloride  3 mL Intravenous Q12H  . sodium polystyrene  30 g Oral Once  . vancomycin  750 mg Intravenous Q24H  . vitamin B-12  200 mcg Oral Daily   Continuous Infusions:     Time spent: 25 minutes    Rayson Rando, [place]  [facility] Pager [phone]. If 7PM-7AM, please contact night-coverage at [email or web address], password [facility] 10/16/2014, 10:44 AM  LOS: 4 days

## 2014-10-16 NOTE — Progress Notes (Signed)
MD made aware of positive  cdiff result. See new order for PO vancomycin. Vwilliams,rn.

## 2014-10-16 NOTE — Progress Notes (Addendum)
Hypoglycemic Event  CBG: 59 (at 0024am)  Treatment: 4oz juice Symptoms:tired  Follow-up CBG: Time:0054 CBG Result:87  Possible Reasons for Event: brittle diabetic  Comments/MD notified:n/a hypoglycemia protocol followed.    [name]  Remember to initiate Hypoglycemia Order Set & complete

## 2014-10-16 NOTE — Progress Notes (Signed)
VASCULAR LAB PRELIMINARY  PRELIMINARY  PRELIMINARY  PRELIMINARY  Left lower extremity venous Doppler completed.    Preliminary report:  There is no DVT noted in the left lower extremity.  There is superficial thrombosis noted in the left lesser saphenous vein from mid calf to distal thigh.  There are multiple enlarged lymph nodes noted in the bilateral groins.  Karthika Glasper, RVT 10/16/2014, 9:55 AM

## 2014-10-17 DIAGNOSIS — A047 Enterocolitis due to Clostridium difficile: Secondary | ICD-10-CM

## 2014-10-17 LAB — GLUCOSE, CAPILLARY
GLUCOSE-CAPILLARY: 211 mg/dL — AB (ref 65–99)
GLUCOSE-CAPILLARY: 284 mg/dL — AB (ref 65–99)
GLUCOSE-CAPILLARY: 283 mg/dL — AB (ref 65–99)
Glucose-Capillary: 207 mg/dL — ABNORMAL HIGH (ref 65–99)
Glucose-Capillary: 265 mg/dL — ABNORMAL HIGH (ref 65–99)

## 2014-10-17 LAB — BASIC METABOLIC PANEL
Anion gap: 7 (ref 5–15)
BUN: 38 mg/dL — AB (ref 6–20)
CO2: 18 mmol/L — ABNORMAL LOW (ref 22–32)
Calcium: 7.7 mg/dL — ABNORMAL LOW (ref 8.9–10.3)
Chloride: 110 mmol/L (ref 101–111)
Creatinine, Ser: 1.35 mg/dL — ABNORMAL HIGH (ref 0.44–1.00)
GFR, EST AFRICAN AMERICAN: 55 mL/min — AB (ref >60)
GFR, EST NON AFRICAN AMERICAN: 48 mL/min — AB (ref >60)
Glucose, Bld: 257 mg/dL — ABNORMAL HIGH (ref 65–99)
Potassium: 4.4 mmol/L (ref 3.5–5.1)
SODIUM: 135 mmol/L (ref 135–145)

## 2014-10-17 LAB — CBC
HEMATOCRIT: 33.8 % — AB (ref 36.0–46.0)
HEMOGLOBIN: 10.7 g/dL — AB (ref 12.0–15.0)
MCH: 28.4 pg (ref 26.0–34.0)
MCHC: 31.7 g/dL (ref 30.0–36.0)
MCV: 89.7 fL (ref 78.0–100.0)
PLATELETS: 386 10*3/uL (ref 150–400)
RBC: 3.77 MIL/uL — AB (ref 3.87–5.11)
RDW: 13.9 % (ref 11.5–15.5)
WBC: 4.8 10*3/uL (ref 4.0–10.5)

## 2014-10-17 LAB — CULTURE, BLOOD (ROUTINE X 2): Culture: NO GROWTH

## 2014-10-17 NOTE — Progress Notes (Signed)
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 After returning home patient developed fever with chills and pain in her left leg. She also noticed blister over the left foot. X-ray done in the ED was negative for ostiomyelitis of the left foot. Patient had low-grade fever on presentation and admitted for cellulitis of the left foot . MRI negative for osteomyelitis but concerning for DVT in left lower leg.   Assessment/Plan: Left lower leg cellulitis with diabetic foot ulcer and MRSA bacteremia Continue empiric vancomycin. As per ID with need at least 2-3 weeks of IV antibiotics.Marland Kitchen MRI of the left foot  negative for abscess or osteomyelitis and shows area of cellulitis.  -2-D echo negative for vegetation. Repeat blood culture without any growth.  -Pain control with when necessary morphine and Toradol. Wound care consult appreciated. Continue daily dressing. -Orthopedics consulted. Recommends no surgical needs an outpatient follow-up with Dr. Sharol Given. (Patient wishes to follow-up with her surgeon and Mikel Cella) Continue supportive care. -PT eval   Type 1 brittle diabetes mellitus  Uncontrolled with last A1C of 8.6.. On insulin pump.inpatient having episodes of hypo-and hyperglycemia. Instructed to reduce her insulin bolus will avoid hypoglycemic episodes. Fingersticks in 200s past 24 hours.   Left lower leg DVT As seen on MRI of the leg for persistent left calf pain. Initial Doppler was negative for DVT which was repeated again  this morning and she just left lower extremity DVT. Started patient on Xarelto. Will treat for 6 months.  C. difficile diarrhea Patient had 3 episodes of diarrhea yesterday which could be following Kayexalate use. For C. difficile sent out was positive. Has not had any diarrhea overnight. Given her recent hospitalization and being on antibiotics we'll treat her for C. difficile with oral vancomycin.  Chronic kidney disease stage IIIA Renal function at baseline.  Hyperkalemia Resolved  Dyslipidemia Continue statin  Protein calorie malnutrition. Nutrition consult  DVT prophylaxis: Subcutaneous heparin  Code Status: Full code  Family Communication: None at bedside  Disposition Plan:  Physical therapy evaluation pending. May need skilled nursing facility. Given need for prolonged IV antibiotic therapy and her underlying CK D stage III which may restrict placement of a PICC line and close wound care management she may benefit from LTAC. I will discuss with social work.   Consultants:  none  Procedures:  MRI left foot  Antibiotics: IV vancomycin7/13-- ceftazidime 7/13-7/15  HPI/Subjective: Patient seen and examined. Blood sugar stable. Still has left calf pain which is slightly improved now. Had 2-3 episodes of diarrhea yesterday which could be following use of kayexalate . However stool for C. difficile was sent out and was positive. Given her recent hospitalization and being on antibiotics would treat her for C. difficile.  Objective: Filed Vitals:   10/17/14 0550  BP: 145/72  Pulse: 77  Temp: 98.1 F (36.7 C)  Resp: 20   No intake or output data in the 24 hours ending 10/17/14 1225 Filed Weights   10/12/14 1622 10/12/14 2243  Weight: 61.236 kg (135 lb) 55.6 kg (122 lb 9.2 oz)    Exam:  General:   no acute distress  HEENT:  moist oral mucosa  Chest: Clear to auscultation bilaterally, no added sounds  CVS: Normal S1 and S2, no murmurs  GI: Soft,  nondistended, nontender, bowel sounds present Musculoskeletal: Warm, no edema, tender to palpation over left calf, dressing over left foot, CNS: Alert and oriented   Data Reviewed: Basic Metabolic Panel:  Recent Labs Lab 10/12/14 1632 10/13/14 0517 10/15/14 1615 10/16/14 0538 10/17/14 0520  NA 133* 138  --  133* 135  K 5.0 4.7  --  5.5* 4.4  CL 103 105  --  107 110  CO2 22 23  --  18* 18*  GLUCOSE 324* 62*  --  445* 257*  BUN 40* 37*  --  47* 38*  CREATININE 1.69* 1.56* 1.34* 1.41* 1.35*  CALCIUM 8.1* 8.3*  --  8.0* 7.7*   Liver Function Tests:  Recent Labs Lab 10/12/14 1632 10/13/14 0517  AST 37 25  ALT 94* 69*  ALKPHOS 146* 125  BILITOT 0.5 0.3  PROT 6.6 5.9*  ALBUMIN 3.0* 2.5*   No results for input(s): LIPASE, AMYLASE in the last 168 hours. No results for input(s): AMMONIA in the last 168 hours. CBC:  Recent Labs Lab 10/12/14 1632 10/13/14 0517 10/17/14 0455  WBC 5.8 4.7 4.8  NEUTROABS 4.2 2.4  --   HGB 11.9* 11.1* 10.7*  HCT 36.9 34.4* 33.8*  MCV 88.5 88.9 89.7  PLT 278 274 386   Cardiac Enzymes:  Recent Labs Lab 10/15/14 1615  CKTOTAL 46   BNP (last 3 results) No results for input(s): BNP in the last 8760 hours.  ProBNP (last 3 results) No results for input(s): PROBNP in the last 8760 hours.  CBG:  Recent Labs Lab 10/16/14 1942 10/16/14 2347 10/17/14 0401 10/17/14 0727 10/17/14 1206  GLUCAP 193* 251* 207* 211* 265*    Recent Results (from the past 240 hour(s))  Culture, blood (routine x 2)     Status: None   Collection Time: 10/12/14  4:31 PM  Result Value Ref Range Status   Specimen Description BLOOD RIGHT ARM  Final   Special Requests BOTTLES DRAWN AEROBIC AND ANAEROBIC 5CC EACH  Final   Culture  Setup Time   Final    GRAM VARIABLE COCCI AEROBIC BOTTLE ONLY CRITICAL RESULT CALLED TO, READ BACK BY AND VERIFIED WITH: N DUNAS 10/13/14 @ 1555 M VESTAL    Culture   Final    METHICILLIN RESISTANT STAPHYLOCOCCUS  AUREUS Performed at [facility]    Report Status 10/15/2014 FINAL  Final   Organism ID, Bacteria METHICILLIN RESISTANT STAPHYLOCOCCUS AUREUS  Final      Susceptibility   Methicillin resistant staphylococcus aureus - MIC*    CIPROFLOXACIN >=8 RESISTANT Resistant     ERYTHROMYCIN >=8 RESISTANT Resistant     GENTAMICIN <=0.5 SENSITIVE Sensitive     OXACILLIN >=4 RESISTANT Resistant     TETRACYCLINE <=1 SENSITIVE Sensitive     VANCOMYCIN 1 SENSITIVE Sensitive     TRIMETH/SULFA <=10 SENSITIVE Sensitive     CLINDAMYCIN >=8 RESISTANT Resistant     RIFAMPIN <=0.5 SENSITIVE Sensitive     Inducible Clindamycin NEGATIVE Sensitive     * METHICILLIN RESISTANT STAPHYLOCOCCUS AUREUS  Culture, blood (routine x 2)     Status: None (Preliminary result)   Collection Time: 10/12/14  5:06 PM  Result Value Ref Range Status   Specimen Description BLOOD LEFT ARM  Final   Special Requests BOTTLES DRAWN AEROBIC AND ANAEROBIC 5CC EACH  Final   Culture   Final    NO GROWTH 4 DAYS Performed at [facility]    Report Status PENDING  Incomplete  MRSA PCR Screening     Status: Abnormal   Collection Time: 10/13/14  1:05 AM  Result Value Ref Range Status   MRSA by PCR POSITIVE (A) NEGATIVE Final    Comment:        The GeneXpert MRSA Assay (FDA approved for NASAL specimens only), is one component of a comprehensive MRSA colonization surveillance program. It is not intended to diagnose MRSA infection nor to guide or monitor treatment for MRSA infections. RESULT CALLED TO, READ BACK BY AND VERIFIED WITH: CORE,Q/5E @0512  ON 10/13/14 BY KARCZEWSKI,S.   Blood Cultures x 2 sites     Status: None (Preliminary result)   Collection Time: 10/14/14  4:30 PM  Result Value Ref Range Status   Specimen Description BLOOD LEFT ARM  Final   Special Requests IN PEDIATRIC BOTTLE  2CC  Final   Culture   Final    NO GROWTH 2 DAYS Performed at [facility]    Report Status PENDING  Incomplete   Blood Cultures x 2 sites     Status: None (Preliminary result)   Collection Time: 10/14/14  4:35 PM  Result Value Ref Range Status   Specimen Description BLOOD RIGHT HAND  Final   Special Requests IN PEDIATRIC BOTTLE  [place]  Final   Culture   Final    NO GROWTH 2 DAYS Performed at [facility]    Report Status PENDING  Incomplete  Clostridium Difficile by PCR (not at [facility])     Status: Abnormal   Collection Time: 10/16/14  5:40 PM  Result Value Ref Range Status   C difficile by pcr POSITIVE (A) NEGATIVE Final    Comment: CRITICAL RESULT CALLED TO, READ BACK BY AND VERIFIED WITH: V.WILLIAMS AT 1851 ON 10/16/14 BY S.VANHOORNE      Studies: Mr Tibia Fibula Left Wo Contrast  10/15/2014   CLINICAL DATA:  Posterior left mid calf pain. Diabetes. Evaluate for possible cellulitis. Initial encounter.  EXAM: MRI OF LOWER LEFT EXTREMITY WITHOUT CONTRAST  TECHNIQUE: Multiplanar, multisequence MR imaging of the left lower leg was performed. No intravenous contrast was administered.  COMPARISON:  Left foot MRI 10/13/2014.  FINDINGS: Study was performed using the body coil. Both lower legs are included on the axial and coronal images.  There is prominent asymmetric subcutaneous edema posteriorly in the proximal left lower leg. This abnormal T2 signal extends into the medial and lateral heads of the gastrocnemius muscle. The soleus muscle appears normal. There is no focal fluid collection. The visualized Achilles tendon appears normal.  More distally within the left lower leg, there is ill-defined T2 hyperintensity within the musculature of the deep posterior compartment. This abnormal signal surrounds the posterior tibial vein. The vein at this level has altered signal and appears mildly attenuated. DVT cannot be excluded. Minimal hyperintensity is noted within the contralateral deep posterior compartment musculature.  No osseous abnormalities are identified.  IMPRESSION: 1. There is both muscular and  subcutaneous T2 signal abnormality in the left calf, likely posttraumatic or inflammatory. No focal fluid collection or tendon tear identified. 2. Nonspecific lower level T2 hyperintensity more distally within the deep posterior compartment of both lower legs. On the left, this is perivascular in distribution and potentially secondary to underlying DVT. Subacute denervation would be an additional consideration. Doppler ultrasound correlation recommended. 3. No osseous abnormalities.  4. These results will be called to the ordering clinician or representative by the Radiologist Assistant, and communication documented in the PACS or zVision Dashboard.   Electronically Signed   By: Richardean Sale M.D.   On: 10/15/2014 15:15    Scheduled Meds: . calcitRIOL  0.25 mcg Oral Q M,W,F  . Chlorhexidine Gluconate Cloth  6 each Topical Q0600  . ferrous sulfate  325 mg Oral TID WC  . folic acid  1 mg Oral Daily  . insulin pump   Subcutaneous TID AC, HS, 0200  . mupirocin ointment   Nasal BID  . pravastatin  20 mg Oral q1800  . pyridoxine  100 mg Oral Daily  . Rivaroxaban  15 mg Oral BID WC  . sodium bicarbonate  650 mg Oral TID  . sodium chloride  3 mL Intravenous Q12H  . vancomycin  125 mg Oral 4 times per day  . vancomycin  750 mg Intravenous Q24H  . vitamin B-12  200 mcg Oral Daily   Continuous Infusions:     Time spent: 25 minutes    Tamia Dial, [place]  [facility] Pager [phone]. If 7PM-7AM, please contact night-coverage at [email or web address], password [facility] 10/17/2014, 12:25 PM  LOS: 5 days

## 2014-10-17 NOTE — Evaluation (Signed)
Physical Therapy Evaluation Patient Details Name: [name] MRN: [id number] DOB: 13-Dec-1970 Today's Date: 10/17/2014   History of Present Illness  [name] is 44 year old female with several adm with L calf pain and  left foot ulceration. She recently had right foot surgery(TMA) done several months ago--pt can't recall when.  doppler = superficial thrombosis left lesser saphenous vein from mid calf to distal thigh; PMHx: poorly controlled DM, HTN   Clinical Impression  Pt admitted with above diagnosis. Pt currently with functional limitations due to the deficits listed below (see PT Problem List). Pt will benefit from skilled PT to increase their independence and safety with mobility to allow discharge to the venue listed below.   Upon arrival pt is in bed naked, and without dressing on her open sore on plantar surface of her L foot; wound dressed by PT with a dry clean dressing to allow for OOB to chair; Pt is limited at time of eval d/t c/o pain in L calf, NWB on RLE since TMA per pt report; She is a fall risk and I have cautioned pt not to get up without assist; pt does report that she has been mobilizing in room by herself and states that she "almost died" trying to amb to bathroom with her rollator; she has diminished insight into her own deficits; recommending SNF vs HHPT depending on progress adn pt compliance; she likely needs some sort of longer term solution given her hx;     Follow Up Recommendations Home health PT;SNF (vs)    Equipment Recommendations       Recommendations for Other Services       Precautions / Restrictions Precautions Precautions: Fall Restrictions Other Position/Activity Restrictions: pt states she is NWB on RLE since TMA; she now has difficulty WBing on LLE d/t thrombosis      Mobility  Bed Mobility Overal bed mobility: Needs Assistance Bed Mobility: Supine to Sit     Supine to sit: Supervision     General bed mobility comments: for safety  only  Transfers Overall transfer level: Needs assistance   Transfers: Lateral/Scoot Transfers          Lateral/Scoot Transfers: Supervision General transfer comment: bed to drop arm chair with supervision for safety after set up, pt WBs on L heel and uses UEs;  Ambulation/Gait             General Gait Details: NT d/t NWB on R and ulcer R foot + pain right calf too great to [employer]  Stairs            Wheelchair Mobility    Modified Rankin (Stroke Patients Only)       Balance Overall balance assessment: Needs assistance             Standing balance comment: standing NT d/t LE precautions/pain--however pt balance is ceratinly impaired d/t R TMA and  at incr risk for falls d/t decr awareness of safety/deficits                             Pertinent Vitals/Pain Pain Assessment: 0-10 Pain Score: 4  Pain Location: L calf Pain Descriptors / Indicators: Dull    Home Living Family/patient expects to be discharged to:: Private residence Living Arrangements: Alone   Type of Home: House Home Access: Stairs to enter Entrance Stairs-Rails: None Entrance Stairs-Number of Steps: 3 Home Layout: Two level;Bed/bath upstairs Home Equipment: Crutches;Walker - 4 wheels;Tub bench;Bedside commode;Wheelchair -  manual      Prior Function Level of Independence: Independent         Comments: Does not work, still driving, not limited with distance; pt was at [facility] prior to this adm, went home for 3days, back to hospital     Hand Dominance        Extremity/Trunk Assessment   Upper Extremity Assessment: Overall WFL for tasks assessed           Lower Extremity Assessment: RLE deficits/detail;LLE deficits/detail RLE Deficits / Details: s/p R TMA; otherwise grossly WFL LLE Deficits / Details: limited df/pf d/t pain; otherwise [facility]     Communication      Cognition Arousal/Alertness: Awake/alert Behavior During Therapy: WFL for tasks  assessed/performed Overall Cognitive Status: Impaired/Different from baseline Area of Impairment: Safety/judgement         Safety/Judgement: Decreased awareness of safety;Decreased awareness of deficits          General Comments      Exercises        Assessment/Plan    PT Assessment Patient needs continued PT services  PT Diagnosis Difficulty walking   PT Problem List Decreased balance;Decreased mobility;Decreased safety awareness  PT Treatment Interventions DME instruction;Gait training;Functional mobility training;Therapeutic activities;Patient/family education   PT Goals (Current goals can be found in the Care Plan section) Acute Rehab PT Goals Patient Stated Goal: does not state PT Goal Formulation: With patient Time For Goal Achievement: 10/31/14 Potential to Achieve Goals: Fair    Frequency Min 2X/week   Barriers to discharge Decreased caregiver support;Inaccessible home environment      Co-evaluation               End of Session   Activity Tolerance: Patient limited by pain Patient left: in chair;with call bell/phone within reach           Time: 1207-1227 PT Time Calculation (min) (ACUTE ONLY): 20 min   Charges:   PT Evaluation $Initial PT Evaluation Tier I: 1 Procedure     PT G CodesKenyon [name] 10/25/2014, 12:39 PM

## 2014-10-18 DIAGNOSIS — Z86718 Personal history of other venous thrombosis and embolism: Secondary | ICD-10-CM | POA: Insufficient documentation

## 2014-10-18 DIAGNOSIS — E440 Moderate protein-calorie malnutrition: Secondary | ICD-10-CM | POA: Diagnosis present

## 2014-10-18 DIAGNOSIS — I82402 Acute embolism and thrombosis of unspecified deep veins of left lower extremity: Secondary | ICD-10-CM | POA: Diagnosis present

## 2014-10-18 DIAGNOSIS — E1041 Type 1 diabetes mellitus with diabetic mononeuropathy: Secondary | ICD-10-CM

## 2014-10-18 LAB — BASIC METABOLIC PANEL
ANION GAP: 7 (ref 5–15)
BUN: 40 mg/dL — ABNORMAL HIGH (ref 6–20)
CHLORIDE: 109 mmol/L (ref 101–111)
CO2: 20 mmol/L — ABNORMAL LOW (ref 22–32)
Calcium: 8.6 mg/dL — ABNORMAL LOW (ref 8.9–10.3)
Creatinine, Ser: 1.46 mg/dL — ABNORMAL HIGH (ref 0.44–1.00)
GFR calc Af Amer: 50 mL/min — ABNORMAL LOW (ref >60)
GFR, EST NON AFRICAN AMERICAN: 43 mL/min — AB (ref >60)
GLUCOSE: 277 mg/dL — AB (ref 65–99)
POTASSIUM: 5.2 mmol/L — AB (ref 3.5–5.1)
Sodium: 136 mmol/L (ref 135–145)

## 2014-10-18 LAB — GLUCOSE, CAPILLARY
GLUCOSE-CAPILLARY: 261 mg/dL — AB (ref 65–99)
Glucose-Capillary: 379 mg/dL — ABNORMAL HIGH (ref 65–99)
Glucose-Capillary: 414 mg/dL — ABNORMAL HIGH (ref 65–99)

## 2014-10-18 MED ORDER — RIVAROXABAN 15 MG PO TABS: 15.0000 mg | ORAL_TABLET | Freq: Two times a day (BID) | ORAL | Status: DC

## 2014-10-18 MED ORDER — VANCOMYCIN 50 MG/ML ORAL SOLUTION: 125.0000 mg | Freq: Four times a day (QID) | ORAL | Status: AC

## 2014-10-18 MED ORDER — HYDROCODONE-ACETAMINOPHEN 10-325 MG PO TABS: 1.0000 | ORAL_TABLET | ORAL | Status: DC | PRN

## 2014-10-18 MED ORDER — VANCOMYCIN HCL IN DEXTROSE 750-5 MG/150ML-% IV SOLN: 750.0000 mg | INTRAVENOUS | Status: AC

## 2014-10-18 NOTE — Care Management Note (Signed)
Case Management Note  Patient Details  Name: [name] MRN: [id number] Date of Birth: 09-20-1970  Subjective/Objective:                 Diabetic foot ulcer and cellutlitis and c-diff   Action/Plan:  ltach   Expected Discharge Date:       [id number]           Expected Discharge Plan:  Long Term Acute Care (LTAC)  In-House Referral:  Clinical Social Work  Discharge planning Services  CM Consult  Post Acute Care Choice:  NA Choice offered to:  NA  DME Arranged:  N/A DME Agency:  NA  HH Arranged:  NA HH Agency:  NA  Status of Service:  In process, will continue to follow  Medicare Important Message Given:  Yes-second notification given Date Medicare IM Given:    Medicare IM give by:    Date Additional Medicare IM Given:    Additional Medicare Important Message give by:     If discussed at [place] of Stay Meetings, dates discussed:    Additional Comments:  Leeroy Cha, RN 10/18/2014, 3:13 PM

## 2014-10-18 NOTE — Progress Notes (Signed)
Date:  October 18, 2014 U.R. performed for needs and level of care. Will continue to follow for Case Management needs.  Velva Harman, RN, BSN, [place]   [phone]

## 2014-10-18 NOTE — Progress Notes (Signed)
ANTIBIOTIC CONSULT NOTE - Follow-Up  Pharmacy Consult for Vancomycin Indication: MRSA bacteremia, diabetic foot infection  Labs:  Recent Labs  10/15/14 1615 10/16/14 0538 10/17/14 0455 10/17/14 0520  WBC  --   --  4.8  --   HGB  --   --  10.7*  --   PLT  --   --  386  --   CREATININE 1.34* 1.41*  --  1.35*   Estimated Creatinine Clearance: 47.6 mL/min (by C-G formula based on Cr of 1.35).  Recent Labs  10/15/14 1615  VANCOTROUGH 18   Assessment: 44 y/o F admitted with diabetic foot infection, r/o osteomyelitis. Orders received to begin empiric cefepime and vancomycin with pharmacy dosing assistance.  7/12 >> Vanc >> 7/13 >> Fortaz >>7/14  7/16 >> PO Vanc >>  7/12 blood: MRSA 1/2 7/14 blood: NGTD MRSA PCR positive >> bactroban 7/16 C. Difficile: POSitive  Dose changes/levels:  7/15: VT = 18 on 750 q24h - cont same.  Today, 10/18/2014: Temp: afebrile WBC: wnl Renal: SCr remains slightly elevated(fluctuating)   Goal of Therapy:  Vancomycin trough level 15-20 mcg/ml  Appropriate antibiotic dosing for renal function; eradication of infection  Plan:  Cont Vanc 750mg  IV q24h Measure Vanc trough this afternoon. Follow up renal fxn, culture results, and clinical course. Await further guidance from ID service regarding planned DOT.  Romeo Rabon, PharmD, pager [phone]. 10/18/2014,9:22 AM.

## 2014-10-18 NOTE — Progress Notes (Signed)
Inpatient Diabetes Program Recommendations  AACE/ADA: New Consensus Statement on Inpatient Glycemic Control (2013)  Target Ranges:  Prepandial:   less than 140 mg/dL      Peak postprandial:   less than 180 mg/dL (1-2 hours)      Critically ill patients:  140 - 180 mg/dL   Reason for Visit: Hyperglycemia  Results for [name], [name] (MRN [id number]) as of 10/18/2014 12:18  Ref. Range 10/17/2014 12:06 10/17/2014 16:59 10/17/2014 21:16 10/18/2014 07:08 10/18/2014 11:31  Glucose-Capillary Latest Ref Range: 65-99 mg/dL 265 (H) 284 (H) 283 (H) 379 (H) 414 (H)     Results for [name], [name] (MRN [id number]) as of 10/18/2014 12:18  Ref. Range 10/17/2014 05:20  Sodium Latest Ref Range: 135-145 mmol/L 135  Potassium Latest Ref Range: 3.5-5.1 mmol/L 4.4  Chloride Latest Ref Range: 101-111 mmol/L 110  CO2 Latest Ref Range: 22-32 mmol/L 18 (L)  BUN Latest Ref Range: 6-20 mg/dL 38 (H)  Creatinine Latest Ref Range: 0.44-1.00 mg/dL 1.35 (H)  Calcium Latest Ref Range: 8.9-10.3 mg/dL 7.7 (L)  EGFR (Non-African Amer.) Latest Ref Range: >60 mL/min 48 (L)  EGFR (African American) Latest Ref Range: >60 mL/min 55 (L)  Glucose Latest Ref Range: 65-99 mg/dL 257 (H)  Anion gap Latest Ref Range: 5-15  7   Pt states she changed her site this am. When asked about her high blood sugars, pt responded "Well, I gave myself insulin, or maybe I didn't." Discussed importance of tight blood sugar control for healing. Reminded of insulin pump policy regarding taking pump off if blood sugar continues to be uncontrolled. Pt states "That's not going to happen."  May be discharged to [facility] this afternoon. Thank you. Lorenda Peck, RD, LDN, CDE Inpatient Diabetes Coordinator [phone]

## 2014-10-18 NOTE — Progress Notes (Signed)
INFECTIOUS DISEASE PROGRESS NOTE  ID: [name] is a 44 y.o. female with  Principal Problem:   Staphylococcus aureus bacteremia Active Problems:   Type 1 diabetes mellitus with neurological manifestations, uncontrolled   Hypercholesteremia   Iron deficiency anemia   CKD (chronic kidney disease), stage III   Status post transmetatarsal amputation of right foot   C. difficile diarrhea   Diabetic foot ulcer   Cellulitis of left foot   Type 1 diabetes mellitus with hyperglycemia   Chronic anemia   Protein-calorie malnutrition   Left leg DVT  Subjective: Without complaints  Abtx:  Anti-infectives    Start     Dose/Rate Route Frequency Ordered Stop   10/18/14 0000  vancomycin (VANCOCIN) 50 mg/mL oral solution     125 mg Oral Every 6 hours 10/18/14 1526 10/29/14 2359   10/18/14 0000  Vancomycin (VANCOCIN) 750 MG/150ML SOLN     750 mg 150 mL/hr over 60 Minutes Intravenous Every 24 hours 10/18/14 1526 11/03/14 2359   10/16/14 2000  vancomycin (VANCOCIN) 50 mg/mL oral solution 125 mg     125 mg Oral 4 times per day 10/16/14 1900     10/14/14 1800  vancomycin (VANCOCIN) IVPB 750 mg/150 ml premix     750 mg 150 mL/hr over 60 Minutes Intravenous Every 24 hours 10/14/14 1217     10/14/14 1300  vancomycin (VANCOCIN) 500 mg in sodium chloride 0.9 % 100 mL IVPB  Status:  Discontinued     500 mg 100 mL/hr over 60 Minutes Intravenous 2 times daily 10/14/14 1213 10/14/14 1217   10/13/14 1800  vancomycin (VANCOCIN) IVPB 750 mg/150 ml premix  Status:  Discontinued     750 mg 150 mL/hr over 60 Minutes Intravenous Every 24 hours 10/13/14 0002 10/14/14 1213   10/13/14 0015  cefTAZidime (FORTAZ) 2 g in dextrose 5 % 50 mL IVPB  Status:  Discontinued     2 g 100 mL/hr over 30 Minutes Intravenous Every 12 hours 10/13/14 0002 10/14/14 1554   10/12/14 1745  vancomycin (VANCOCIN) IVPB 1000 mg/200 mL premix     1,000 mg 200 mL/hr over 60 Minutes Intravenous  Once 10/12/14 1738 10/12/14 1851       Medications:  Scheduled: . calcitRIOL  0.25 mcg Oral Q M,W,F  . Chlorhexidine Gluconate Cloth  6 each Topical Q0600  . ferrous sulfate  325 mg Oral TID WC  . folic acid  1 mg Oral Daily  . insulin pump   Subcutaneous TID AC, HS, 0200  . mupirocin ointment   Nasal BID  . pravastatin  20 mg Oral q1800  . pyridoxine  100 mg Oral Daily  . Rivaroxaban  15 mg Oral BID WC  . sodium bicarbonate  650 mg Oral TID  . sodium chloride  3 mL Intravenous Q12H  . vancomycin  125 mg Oral 4 times per day  . vancomycin  750 mg Intravenous Q24H  . vitamin B-12  200 mcg Oral Daily    Objective: Vital signs in last 24 hours: Temp:  [97.7 F (36.5 C)-98.2 F (36.8 C)] 98.2 F (36.8 C) (07/18 1510) Pulse Rate:  [75-85] 77 (07/18 1510) Resp:  [16-18] 18 (07/18 1510) BP: (142-167)/(77-87) 149/77 mmHg (07/18 1510) SpO2:  [98 %-99 %] 99 % (07/18 1510)   General appearance: alert, cooperative and no distress Extremities: L foot os without d/c. non-tender.   Lab Results  Recent Labs  10/16/14 0538 10/17/14 0455 10/17/14 0520  WBC  --  4.8  --   HGB  --  10.7*  --   HCT  --  33.8*  --   NA 133*  --  135  K 5.5*  --  4.4  CL 107  --  110  CO2 18*  --  18*  BUN 47*  --  38*  CREATININE 1.41*  --  1.35*   Liver Panel No results for input(s): PROT, ALBUMIN, AST, ALT, ALKPHOS, BILITOT, BILIDIR, IBILI in the last 72 hours. Sedimentation Rate No results for input(s): ESRSEDRATE in the last 72 hours. C-Reactive Protein No results for input(s): CRP in the last 72 hours.  Microbiology: Recent Results (from the past 240 hour(s))  Culture, blood (routine x 2)     Status: None   Collection Time: 10/12/14  4:31 PM  Result Value Ref Range Status   Specimen Description BLOOD RIGHT ARM  Final   Special Requests BOTTLES DRAWN AEROBIC AND ANAEROBIC 5CC EACH  Final   Culture  Setup Time   Final    GRAM VARIABLE COCCI AEROBIC BOTTLE ONLY CRITICAL RESULT CALLED TO, READ BACK BY AND VERIFIED  WITH: N DUNAS 10/13/14 @ 1555 M VESTAL    Culture   Final    METHICILLIN RESISTANT STAPHYLOCOCCUS AUREUS Performed at [facility]    Report Status 10/15/2014 FINAL  Final   Organism ID, Bacteria METHICILLIN RESISTANT STAPHYLOCOCCUS AUREUS  Final      Susceptibility   Methicillin resistant staphylococcus aureus - MIC*    CIPROFLOXACIN >=8 RESISTANT Resistant     ERYTHROMYCIN >=8 RESISTANT Resistant     GENTAMICIN <=0.5 SENSITIVE Sensitive     OXACILLIN >=4 RESISTANT Resistant     TETRACYCLINE <=1 SENSITIVE Sensitive     VANCOMYCIN 1 SENSITIVE Sensitive     TRIMETH/SULFA <=10 SENSITIVE Sensitive     CLINDAMYCIN >=8 RESISTANT Resistant     RIFAMPIN <=0.5 SENSITIVE Sensitive     Inducible Clindamycin NEGATIVE Sensitive     * METHICILLIN RESISTANT STAPHYLOCOCCUS AUREUS  Culture, blood (routine x 2)     Status: None   Collection Time: 10/12/14  5:06 PM  Result Value Ref Range Status   Specimen Description BLOOD LEFT ARM  Final   Special Requests BOTTLES DRAWN AEROBIC AND ANAEROBIC 5CC EACH  Final   Culture   Final    NO GROWTH 5 DAYS Performed at [facility]    Report Status 10/17/2014 FINAL  Final  MRSA PCR Screening     Status: Abnormal   Collection Time: 10/13/14  1:05 AM  Result Value Ref Range Status   MRSA by PCR POSITIVE (A) NEGATIVE Final    Comment:        The GeneXpert MRSA Assay (FDA approved for NASAL specimens only), is one component of a comprehensive MRSA colonization surveillance program. It is not intended to diagnose MRSA infection nor to guide or monitor treatment for MRSA infections. RESULT CALLED TO, READ BACK BY AND VERIFIED WITH: CORE,Q/5E @0512  ON 10/13/14 BY KARCZEWSKI,S.   Blood Cultures x 2 sites     Status: None (Preliminary result)   Collection Time: 10/14/14  4:30 PM  Result Value Ref Range Status   Specimen Description BLOOD LEFT ARM  Final   Special Requests IN PEDIATRIC BOTTLE  2CC  Final   Culture   Final    NO GROWTH 4  DAYS Performed at [facility]    Report Status PENDING  Incomplete  Blood Cultures x 2 sites  Status: None (Preliminary result)   Collection Time: 10/14/14  4:35 PM  Result Value Ref Range Status   Specimen Description BLOOD RIGHT HAND  Final   Special Requests IN PEDIATRIC BOTTLE  [place]  Final   Culture   Final    NO GROWTH 4 DAYS Performed at [facility]    Report Status PENDING  Incomplete  Clostridium Difficile by PCR (not at [facility])     Status: Abnormal   Collection Time: 10/16/14  5:40 PM  Result Value Ref Range Status   C difficile by pcr POSITIVE (A) NEGATIVE Final    Comment: CRITICAL RESULT CALLED TO, READ BACK BY AND VERIFIED WITH: V.WILLIAMS AT 1851 ON 10/16/14 BY S.VANHOORNE     Studies/Results: No results found.   Assessment/Plan: MRSA bacteremia DM1 CKD stage 3 LLE cellulitis L calf "knot" Diabetic Foot ulcer C diff previously  Total days of antibiotics: 4 vanco  Would aim for 3 weeks of vanco To [facility].  Needs repeat BCx 1 week off/after anbx complete Available as needed          Bobby Rumpf Infectious Diseases (pager) [phone] [email or web address] 10/18/2014, 4:17 PM  LOS: 6 days

## 2014-10-18 NOTE — Discharge Summary (Signed)
Physician Discharge Summary  [name] [id number] DOB: [birth year] DOA: 10/12/2014  PCP: Hayden Rasmussen., MD  Admit date: 10/12/2014 Discharge date: 10/18/2014  Time spent: 35 minutes  Recommendations for Outpatient Follow-up:  1. Discharge to [facility] for long-term care. 2. Patient is being treated with a total 3 weeks course of IV vancomycin for now, until 11/03/2014 (she should follow-up with infectious disease clinic in 2 weeks) 3. Patient plans to follow-up with her orthopedic surgeon at [facility] (has appointment on 10/22/2014) 4. Complete 2 week course of oral vancomycin on 10/29/2014. 5. Plan on 6 months off anticoagulation for left lower extremity DVT (stop date 04/17/2015)  Discharge Diagnoses:  Principal Problem:   Staphylococcus aureus bacteremia   Active Problems:   Type 1 diabetes mellitus with neurological manifestations, uncontrolled   Cellulitis of left foot DVT of left lower extremity   Diabetic left foot ulcer   C. difficile diarrhea   Hypercholesteremia   Iron deficiency anemia   CKD (chronic kidney disease), stage III   Status post transmetatarsal amputation of right foot   Uncontrolled diabetes mellitus with hyperglycemia   Chronic anemia   Protein-calorie malnutrition   Discharge Condition: Fair  Diet recommendation: Diabetic  Filed Weights   10/12/14 1622 10/12/14 2243  Weight: 61.236 kg (135 lb) 55.6 kg (122 lb 9.2 oz)    History of present illness:  Please refer to admission H&P for details, in brief, 44 year old female with history of type 1 diabetes mellitus on insulin pump (uncontrolled), chronic kidney disease stage III, hyperlipidemia, hypertension, hospitalized one month back for right diabetic foot ulcer and was transferred to [facility] (as per her request) and underwent ray amputation of the right foot and went to rehab from where she was discharged 2 days back. After returning home patient developed fever  with chills and pain in her left leg. She also noticed blister over the left foot. X-ray done in the ED was negative for ostiomyelitis of the left foot. Patient had low-grade fever on presentation and admitted for cellulitis of the left foot . MRI negative for osteomyelitis but concerning for DVT in left lower leg.  Hospital Course:  Left lower leg cellulitis with diabetic foot ulcer and MRSA bacteremia Patient placed on empiric vancomycin and ceftazidime. Following positive blood cultures ceftazidime were discontinued and maintained on IV vancomycin with recommendations for at least 2-3 weeks off IV vancomycin. Follow blood cultures were negative. ID suggested that it was not necessarily for patient to have TEE (since only 1/2 sets of blood culture will positive and negative follow-up blood cultures) - MRI of the left foot negative for abscess or osteomyelitis and shows area of cellulitis.  -2-D echo negative for vegetation. Repeat blood culture without any growth.  -Pain control with when necessary morphine and Toradol. Will discharge on oral Vicodin. Wound care consult appreciated. Continue daily dry dressing. -Orthopedics consulted. Recommends no surgical needs an outpatient follow-up with Dr. Sharol Given. (Patient wishes to follow-up with her surgeon at [facility]) Continue supportive care. -Patient cannot have a PICC line for prolonged IV antibiotic due to underlying chronic kidney disease. Due to her foot ulcer and pain she has difficulty arranging to come to [facility] for daily IV antibiotics.. Discussed with case manager and social worker and patient appears to be eligible for LTAC for prolonged IV antibody, physical therapy needs,,underlying DVT and control diabetes. -Discussed with ID. Plan on treating her for a total 3 weeks of IV vancomycin for now and she should follow-up in  the ID clinic in 2 weeks to further determine antibiotics course.  Type 1 brittle diabetes mellitus Uncontrolled with last  A1C of 8.6.. On insulin pump.inpatient having episodes of hypo-and hyperglycemia. Patient noncompliant with the diet and also using insulin bolus inappropriately. His fingersticks continue to remain high I would recommend discontinuing her insulin pump and monitor with basal bolus and sliding scale insulin.   Left lower leg DVT As seen on MRI of the leg done for persistent left calf pain. Initial Doppler done initially was negative for DVT which was repeated again which was positive left lower extremity DVT. Started patient on Xarelto. Will treat for a total  6 months duration.  C. difficile diarrhea Being treated with oral vancomycin for 2 weeks course. No further diarrhea symptoms.   Chronic kidney disease stage IIIA Renal function at baseline.  Hyperkalemia Resolved  Dyslipidemia Continue statin  Protein calorie malnutrition. Nutrition consult appreciated.    Code Status: Full code  Family Communication: None at bedside  Disposition Plan:  LTAC   Consultants:  none  Procedures:  MRI left foot  Antibiotics: IV vancomycin7/13--until 11/03/2014 should follow-up with ID in 2 weeks) ceftazidime 7/13-7/15 Oral vancomycin 7/15- 7/29   Discharge Exam: Filed Vitals:   10/18/14 1510  BP: 149/77  Pulse: 77  Temp: 98.2 F (36.8 C)  Resp: 18   Gen.: Middle aged thin built female in no acute distress HEENT: No pallor, moist oral mucosa, supple neck Chest: Clear to auscultation bilaterally, no added sound CVS: Normal S1 and S2, no murmurs or gallop GI: Soft, nondistended, nontender, bowel sounds present, has insulin pump Musculoskeletal: Warm, no edema, left upper calf tenderness with a lump palpable, dressing or left foot with clean plantar ulcer,s/p  right mid foot amputation. CNS: Alert and oriented   Discharge Instructions    Current Discharge Medication List    START taking these medications   Details  HYDROcodone-acetaminophen (NORCO) 10-325 MG per  tablet Take 1 tablet by mouth every 4 (four) hours as needed for moderate pain. Qty: 30 tablet, Refills: 0    Rivaroxaban (XARELTO) 15 MG TABS tablet Take 1 tablet (15 mg total) by mouth 2 (two) times daily with a meal. Qty: 30 tablet, Refills: 0    vancomycin (VANCOCIN) 50 mg/mL oral solution Take 2.5 mLs (125 mg total) by mouth every 6 (six) hours. Qty: 120 mL, Refills: 0    Vancomycin (VANCOCIN) 750 MG/150ML SOLN Inject 150 mLs (750 mg total) into the vein daily. Qty: 4000 mL, Refills: 0      CONTINUE these medications which have NOT CHANGED   Details  albuterol (PROVENTIL HFA;VENTOLIN HFA) 108 (90 BASE) MCG/ACT inhaler Inhale 2 puffs into the lungs every 6 (six) hours as needed. S.O.B.    calcitRIOL (ROCALTROL) 0.25 MCG capsule Take 0.25 mcg by mouth every Monday, Wednesday, and Friday. M,W,F    ferrous sulfate 325 (65 FE) MG tablet Take 325 mg by mouth 3 (three) times daily with meals.     folic acid (FOLVITE) 1 MG tablet Take 1 mg by mouth daily.    furosemide (LASIX) 20 MG tablet Take 20 mg by mouth daily as needed for fluid.     Insulin Human (INSULIN PUMP) SOLN Inject into the skin. Novolog    lactobacillus acidophilus (BACID) TABS tablet Take 1 tablet by mouth 3 (three) times daily.    Multiple Minerals-Vitamins ([facility] CALCIUM 600 PLUS) CHEW Chew 1,200 mg by mouth daily.    pravastatin (PRAVACHOL) 40 MG tablet TAKE  1/2 TABLETS (20 MG TOTAL) BY MOUTH DAILY. Qty: 30 tablet, Refills: 2    pyridoxine (B-6) 100 MG tablet Take 100 mg by mouth daily.    sodium bicarbonate 650 MG tablet Take 650 mg by mouth 3 (three) times daily.    vitamin B-12 (CYANOCOBALAMIN) 100 MCG tablet Take 200 mcg by mouth daily.        Allergies  Allergen Reactions  . Ciprofloxacin Itching  . Cleocin [Clindamycin Hcl]     Diarrhea    Follow-up Information    Follow up with Michel Bickers, MD. Schedule an appointment as soon as possible for a visit in 2 weeks.   Specialty:  Infectious  Diseases   Contact information:   [address] [place] [address] [phone]        The results of significant diagnostics from this hospitalization (including imaging, microbiology, ancillary and laboratory) are listed below for reference.    Significant Diagnostic Studies: Mr Tibia Fibula Left Wo Contrast  10/15/2014   CLINICAL DATA:  Posterior left mid calf pain. Diabetes. Evaluate for possible cellulitis. Initial encounter.  EXAM: MRI OF LOWER LEFT EXTREMITY WITHOUT CONTRAST  TECHNIQUE: Multiplanar, multisequence MR imaging of the left lower leg was performed. No intravenous contrast was administered.  COMPARISON:  Left foot MRI 10/13/2014.  FINDINGS: Study was performed using the body coil. Both lower legs are included on the axial and coronal images.  There is prominent asymmetric subcutaneous edema posteriorly in the proximal left lower leg. This abnormal T2 signal extends into the medial and lateral heads of the gastrocnemius muscle. The soleus muscle appears normal. There is no focal fluid collection. The visualized Achilles tendon appears normal.  More distally within the left lower leg, there is ill-defined T2 hyperintensity within the musculature of the deep posterior compartment. This abnormal signal surrounds the posterior tibial vein. The vein at this level has altered signal and appears mildly attenuated. DVT cannot be excluded. Minimal hyperintensity is noted within the contralateral deep posterior compartment musculature.  No osseous abnormalities are identified.  IMPRESSION: 1. There is both muscular and subcutaneous T2 signal abnormality in the left calf, likely posttraumatic or inflammatory. No focal fluid collection or tendon tear identified. 2. Nonspecific lower level T2 hyperintensity more distally within the deep posterior compartment of both lower legs. On the left, this is perivascular in distribution and potentially secondary to underlying DVT. Subacute  denervation would be an additional consideration. Doppler ultrasound correlation recommended. 3. No osseous abnormalities. 4. These results will be called to the ordering clinician or representative by the Radiologist Assistant, and communication documented in the PACS or zVision Dashboard.   Electronically Signed   By: Richardean Sale M.D.   On: 10/15/2014 15:15   Mr Foot Left Wo Contrast  10/14/2014   CLINICAL DATA:  Plantar diabetic foot ulcer. Pain and blistering. Cellulitis.  EXAM: MRI OF THE LEFT FOREFOOT WITHOUT CONTRAST  TECHNIQUE: Multiplanar, multisequence MR imaging was performed. No intravenous contrast was administered.  COMPARISON:  Multiple exams, including 09/04/2014  FINDINGS: Plantar ulceration in skin disruption below the lateral portion of the Lisfranc joint as on image 11 of series 5 with surrounding edema in the subcutaneous tissues. The underlying peroneus longus tendon appears normal, and no drainable abscess is observed in the foot.  Regional subcutaneous edema tracks primarily along the plantar foot towards the toes, but also with some low-level edema tracking around to the dorsal foot. Also low-level is the edema tracking within the plantar foot  musculature.  No significant marrow edema in the forefoot, midfoot, or visualize part of the hindfoot to suggest osteomyelitis. There is some speckled T2 high signal hyperintensities in the bone suggesting osteoporosis of disuse.  The Lisfranc ligament appears intact. There appears to been fusion of the navicular, medial cuneiform, and probably the middle cuneiform. Pes planus noted.  IMPRESSION: 1. No abscess or osteomyelitis identified. 2. 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 3. Continued skin ulceration plantar to the Lisfranc joint laterally. Surrounding subcutaneous edema and cellulitis is primarily plantar with some mild dorsal extension. Low-level myositis along the plantar  foot musculature.   Electronically Signed   By: Van Clines M.D.   On: 10/14/2014 07:43   Dg Foot Complete Left  10/12/2014   CLINICAL DATA:  44 year old female with left foot ulcer and the fourth and fifth metatarsal base  EXAM: LEFT FOOT - COMPLETE 3+ VIEW  COMPARISON:  MRI dated 09/04/2014  FINDINGS: There is diffuse osteopenia. No acute fracture. There is chronic changes and deformity of the middle phalanx of the second toe. A skin ulcer is noted along the plantar aspect of the foot at the tarsometatarsal junction. No soft tissue gas or evidence of bony erosion identified. MRI may provide better evaluation if osteomyelitis is clinically suspected.  IMPRESSION: Skin ulcer at the plantar aspect of the left foot. No soft tissue gas.  Osteopenia.  No acute fracture or bony erosion.   Electronically Signed   By: Anner Crete M.D.   On: 10/12/2014 17:20    Microbiology: Recent Results (from the past 240 hour(s))  Culture, blood (routine x 2)     Status: None   Collection Time: 10/12/14  4:31 PM  Result Value Ref Range Status   Specimen Description BLOOD RIGHT ARM  Final   Special Requests BOTTLES DRAWN AEROBIC AND ANAEROBIC 5CC EACH  Final   Culture  Setup Time   Final    GRAM VARIABLE COCCI AEROBIC BOTTLE ONLY CRITICAL RESULT CALLED TO, READ BACK BY AND VERIFIED WITH: N DUNAS 10/13/14 @ 1555 M VESTAL    Culture   Final    METHICILLIN RESISTANT STAPHYLOCOCCUS AUREUS Performed at [facility]    Report Status 10/15/2014 FINAL  Final   Organism ID, Bacteria METHICILLIN RESISTANT STAPHYLOCOCCUS AUREUS  Final      Susceptibility   Methicillin resistant staphylococcus aureus - MIC*    CIPROFLOXACIN >=8 RESISTANT Resistant     ERYTHROMYCIN >=8 RESISTANT Resistant     GENTAMICIN <=0.5 SENSITIVE Sensitive     OXACILLIN >=4 RESISTANT Resistant     TETRACYCLINE <=1 SENSITIVE Sensitive     VANCOMYCIN 1 SENSITIVE Sensitive     TRIMETH/SULFA <=10 SENSITIVE Sensitive     CLINDAMYCIN  >=8 RESISTANT Resistant     RIFAMPIN <=0.5 SENSITIVE Sensitive     Inducible Clindamycin NEGATIVE Sensitive     * METHICILLIN RESISTANT STAPHYLOCOCCUS AUREUS  Culture, blood (routine x 2)     Status: None   Collection Time: 10/12/14  5:06 PM  Result Value Ref Range Status   Specimen Description BLOOD LEFT ARM  Final   Special Requests BOTTLES DRAWN AEROBIC AND ANAEROBIC 5CC EACH  Final   Culture   Final    NO GROWTH 5 DAYS Performed at [facility]    Report Status 10/17/2014 FINAL  Final  MRSA PCR Screening     Status: Abnormal   Collection Time: 10/13/14  1:05 AM  Result  Value Ref Range Status   MRSA by PCR POSITIVE (A) NEGATIVE Final    Comment:        The GeneXpert MRSA Assay (FDA approved for NASAL specimens only), is one component of a comprehensive MRSA colonization surveillance program. It is not intended to diagnose MRSA infection nor to guide or monitor treatment for MRSA infections. RESULT CALLED TO, READ BACK BY AND VERIFIED WITH: CORE,Q/5E @0512  ON 10/13/14 BY KARCZEWSKI,S.   Blood Cultures x 2 sites     Status: None (Preliminary result)   Collection Time: 10/14/14  4:30 PM  Result Value Ref Range Status   Specimen Description BLOOD LEFT ARM  Final   Special Requests IN PEDIATRIC BOTTLE  2CC  Final   Culture   Final    NO GROWTH 4 DAYS Performed at [facility]    Report Status PENDING  Incomplete  Blood Cultures x 2 sites     Status: None (Preliminary result)   Collection Time: 10/14/14  4:35 PM  Result Value Ref Range Status   Specimen Description BLOOD RIGHT HAND  Final   Special Requests IN PEDIATRIC BOTTLE  F[place]  Final   Culture   Final    NO GROWTH 4 DAYS Performed at [facility]    Report Status PENDING  Incomplete  Clostridium Difficile by PCR (not at [facility])     Status: Abnormal   Collection Time: 10/16/14  5:40 PM  Result Value Ref Range Status   C difficile by pcr POSITIVE (A) NEGATIVE Final    Comment: CRITICAL RESULT  CALLED TO, READ BACK BY AND VERIFIED WITH: V.WILLIAMS AT 1851 ON 10/16/14 BY S.VANHOORNE      Labs: Basic Metabolic Panel:  Recent Labs Lab 10/12/14 1632 10/13/14 0517 10/15/14 1615 10/16/14 0538 10/17/14 0520  NA 133* 138  --  133* 135  K 5.0 4.7  --  5.5* 4.4  CL 103 105  --  107 110  CO2 22 23  --  18* 18*  GLUCOSE 324* 62*  --  445* 257*  BUN 40* 37*  --  47* 38*  CREATININE 1.69* 1.56* 1.34* 1.41* 1.35*  CALCIUM 8.1* 8.3*  --  8.0* 7.7*   Liver Function Tests:  Recent Labs Lab 10/12/14 1632 10/13/14 0517  AST 37 25  ALT 94* 69*  ALKPHOS 146* 125  BILITOT 0.5 0.3  PROT 6.6 5.9*  ALBUMIN 3.0* 2.5*   No results for input(s): LIPASE, AMYLASE in the last 168 hours. No results for input(s): AMMONIA in the last 168 hours. CBC:  Recent Labs Lab 10/12/14 1632 10/13/14 0517 10/17/14 0455  WBC 5.8 4.7 4.8  NEUTROABS 4.2 2.4  --   HGB 11.9* 11.1* 10.7*  HCT 36.9 34.4* 33.8*  MCV 88.5 88.9 89.7  PLT 278 274 386   Cardiac Enzymes:  Recent Labs Lab 10/15/14 1615  CKTOTAL 46   BNP: BNP (last 3 results) No results for input(s): BNP in the last 8760 hours.  ProBNP (last 3 results) No results for input(s): PROBNP in the last 8760 hours.  CBG:  Recent Labs Lab 10/17/14 1206 10/17/14 1659 10/17/14 2116 10/18/14 0708 10/18/14 1131  GLUCAP 265* 284* 283* 379* 414*       Signed:  Laylana Gerwig  [facility] 10/18/2014, 3:30 PM

## 2014-10-18 NOTE — Progress Notes (Signed)
Clinical Social Work  CSW received referral to complete LTAC referral. Patient was discussed during progression meeting with MD and CM present. CM agreeable to complete LTAC referral this morning to determine if patient is eligible. CSW is signing off but available if needed.  [place], [place] [phone]

## 2014-10-19 ENCOUNTER — Encounter (HOSPITAL_COMMUNITY): Payer: Medicaid Other

## 2014-10-19 LAB — CULTURE, BLOOD (ROUTINE X 2)
CULTURE: NO GROWTH
CULTURE: NO GROWTH

## 2014-10-20 ENCOUNTER — Encounter (HOSPITAL_COMMUNITY): Payer: Medicaid Other

## 2014-10-20 ENCOUNTER — Encounter (HOSPITAL_BASED_OUTPATIENT_CLINIC_OR_DEPARTMENT_OTHER): Payer: Medicare Other

## 2014-10-26 ENCOUNTER — Encounter (HOSPITAL_COMMUNITY): Payer: Medicaid Other

## 2014-10-27 ENCOUNTER — Encounter (HOSPITAL_COMMUNITY): Payer: Medicaid Other

## 2014-10-27 IMAGING — CR DG KNEE COMPLETE 4+V*L*
4 series · 4 of 4 positions shown · non-contrast
Comparison: None.

CLINICAL DATA: Fall 3 days ago.  Pain.

EXAM:
LEFT KNEE - COMPLETE 4+ VIEW

[t knee ap left]
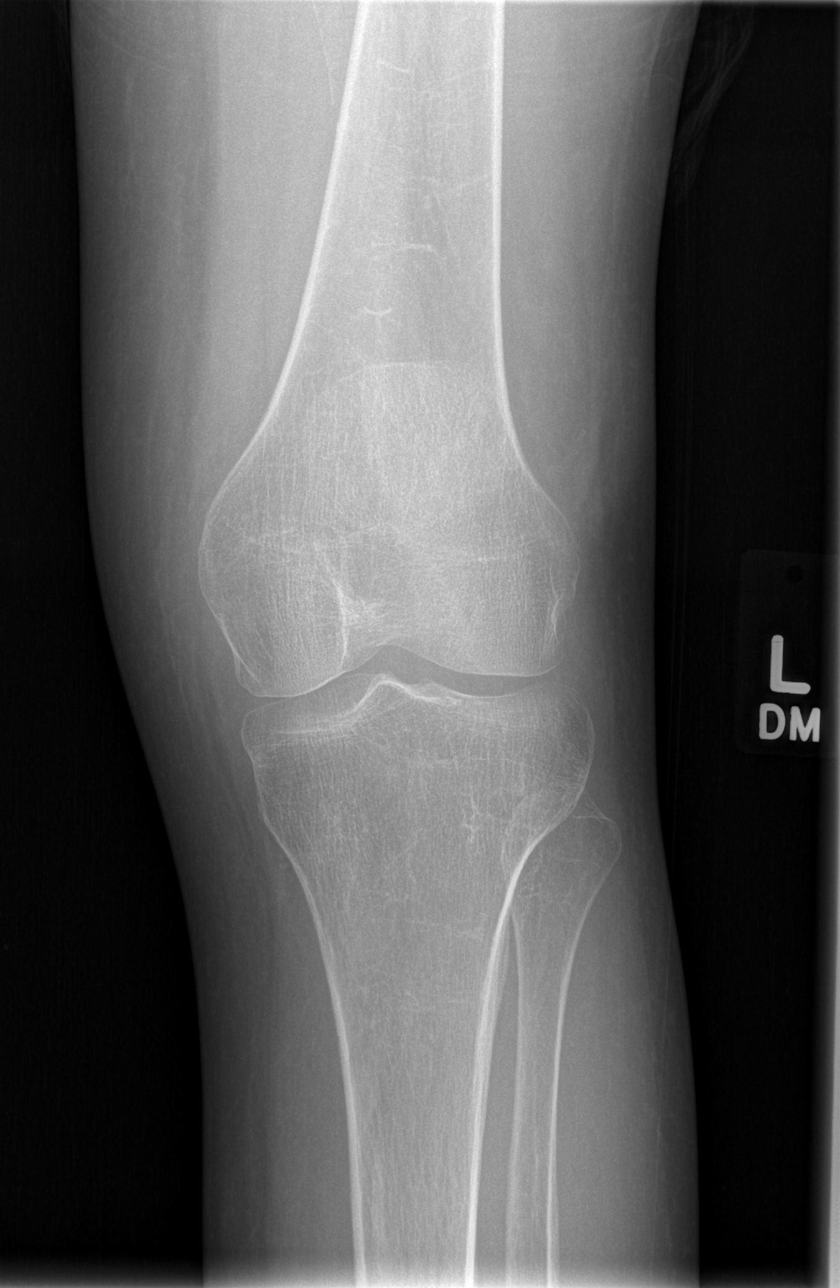

[t knee oblique left (1 of 2)]
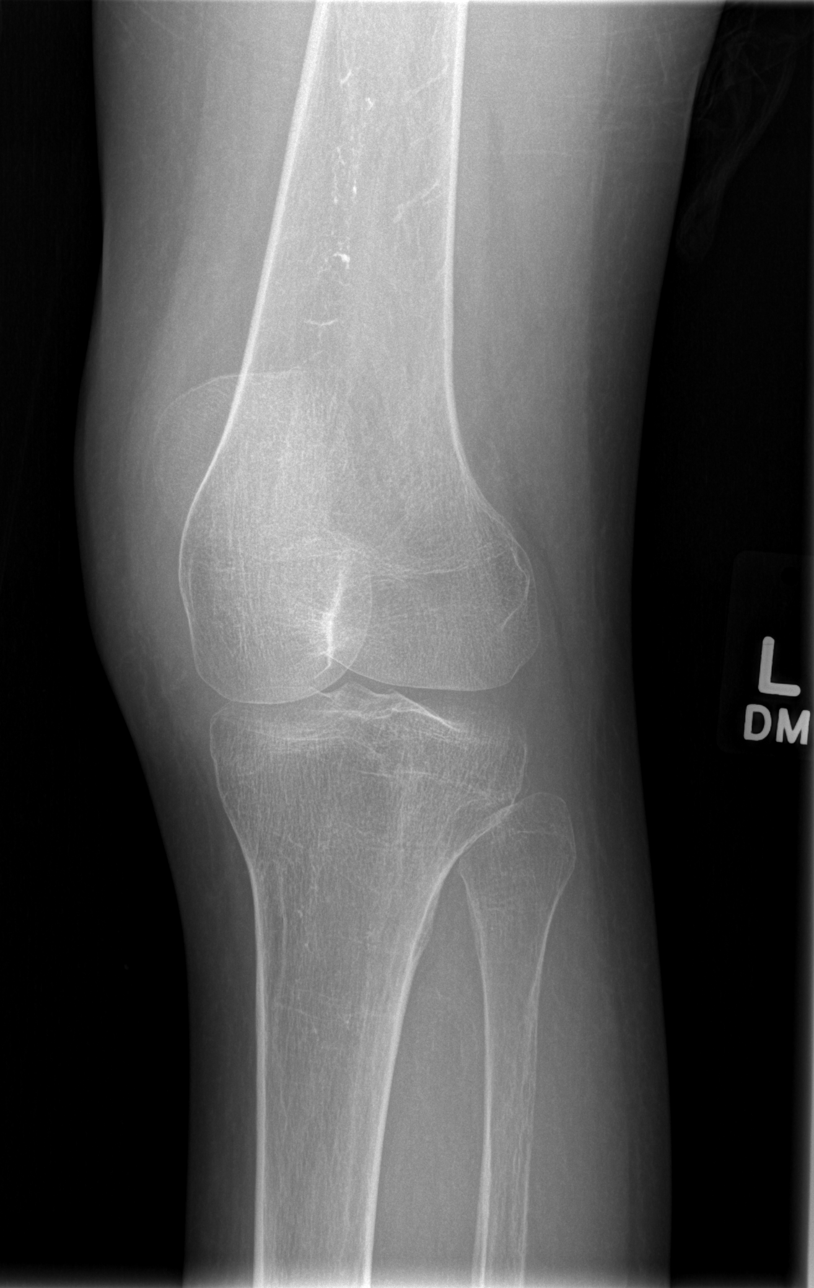

[t knee oblique left (2 of 2)]
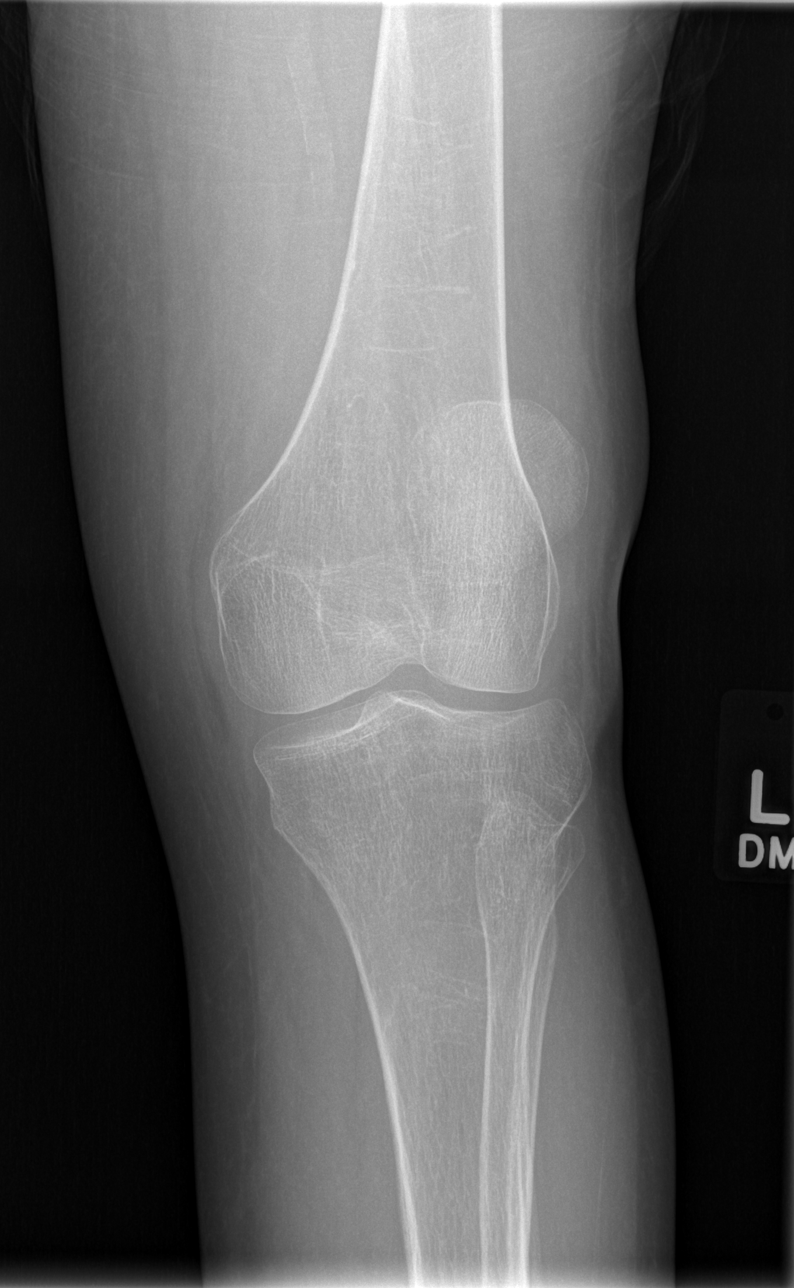

[t knee lat left]
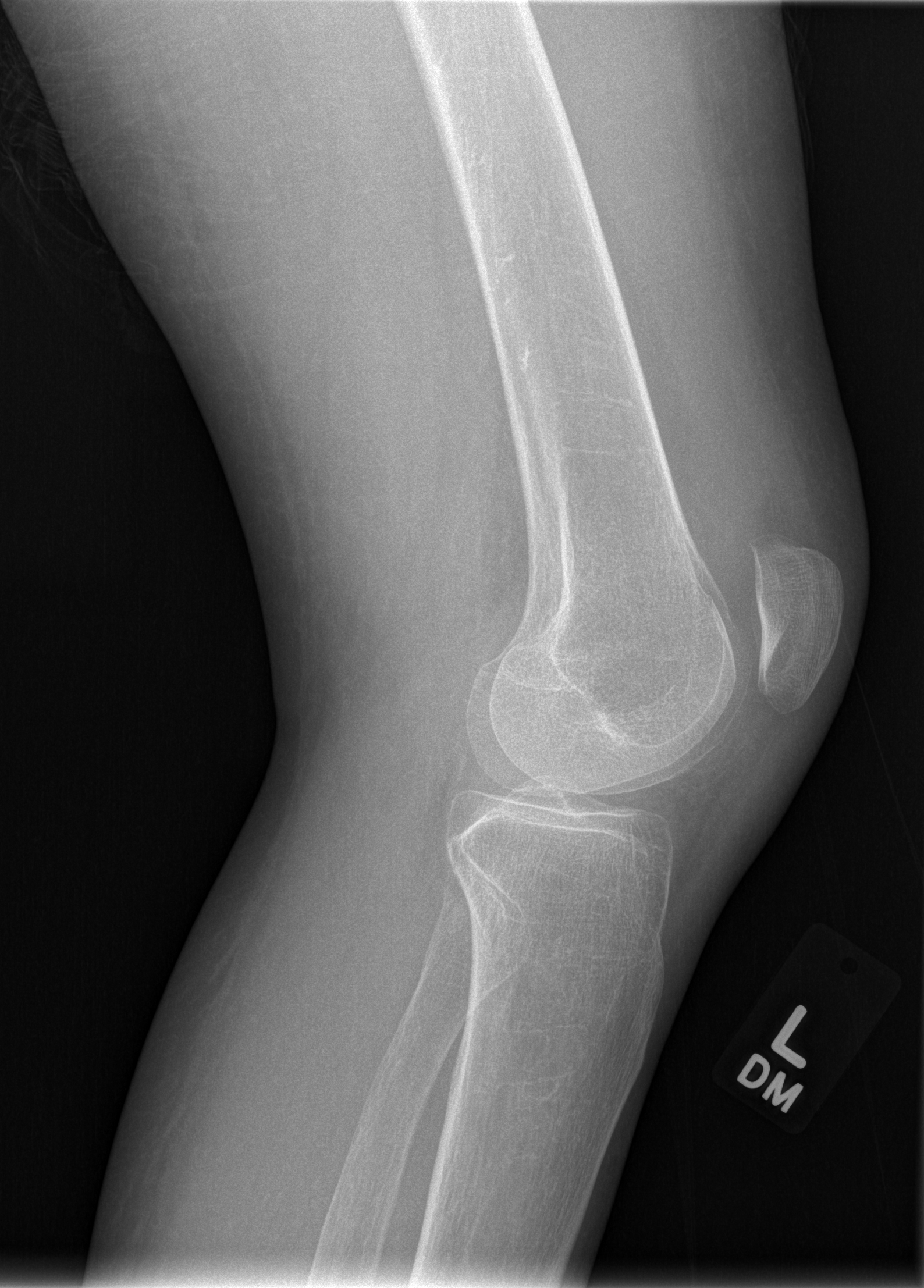

[4 of 4 positions shown; findings below may reference images not displayed]

FINDINGS: Bones are osteopenic.

No fracture or dislocation.

Small joint effusion suspected which may reflect result of internal
derangement. If there is persistent or progressive discomfort, MR
imaging may be considered.
IMPRESSION: No fracture or dislocation.

Joint effusion suspected.  Please see above.

## 2014-10-28 IMAGING — CR DG ANKLE COMPLETE 3+V*L*
3 series · 3 of 3 positions shown · non-contrast
Comparison: 04/30/2013 foot radiography

CLINICAL DATA: Fall with ankle pain.

EXAM:
LEFT ANKLE COMPLETE - 3+ VIEW

[t ankle joint ap left]
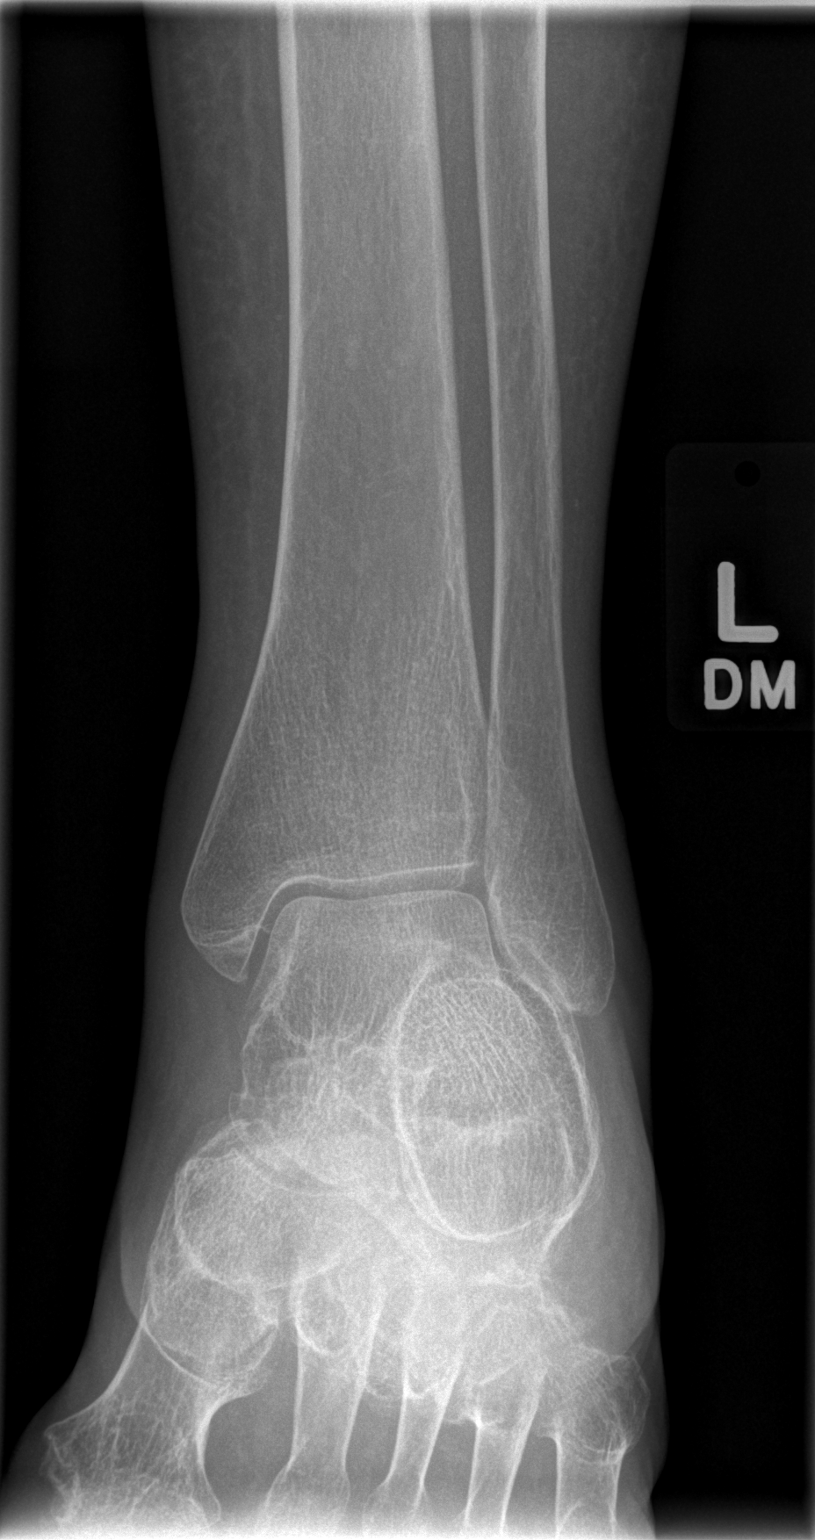

[t ankle joint oblique left]
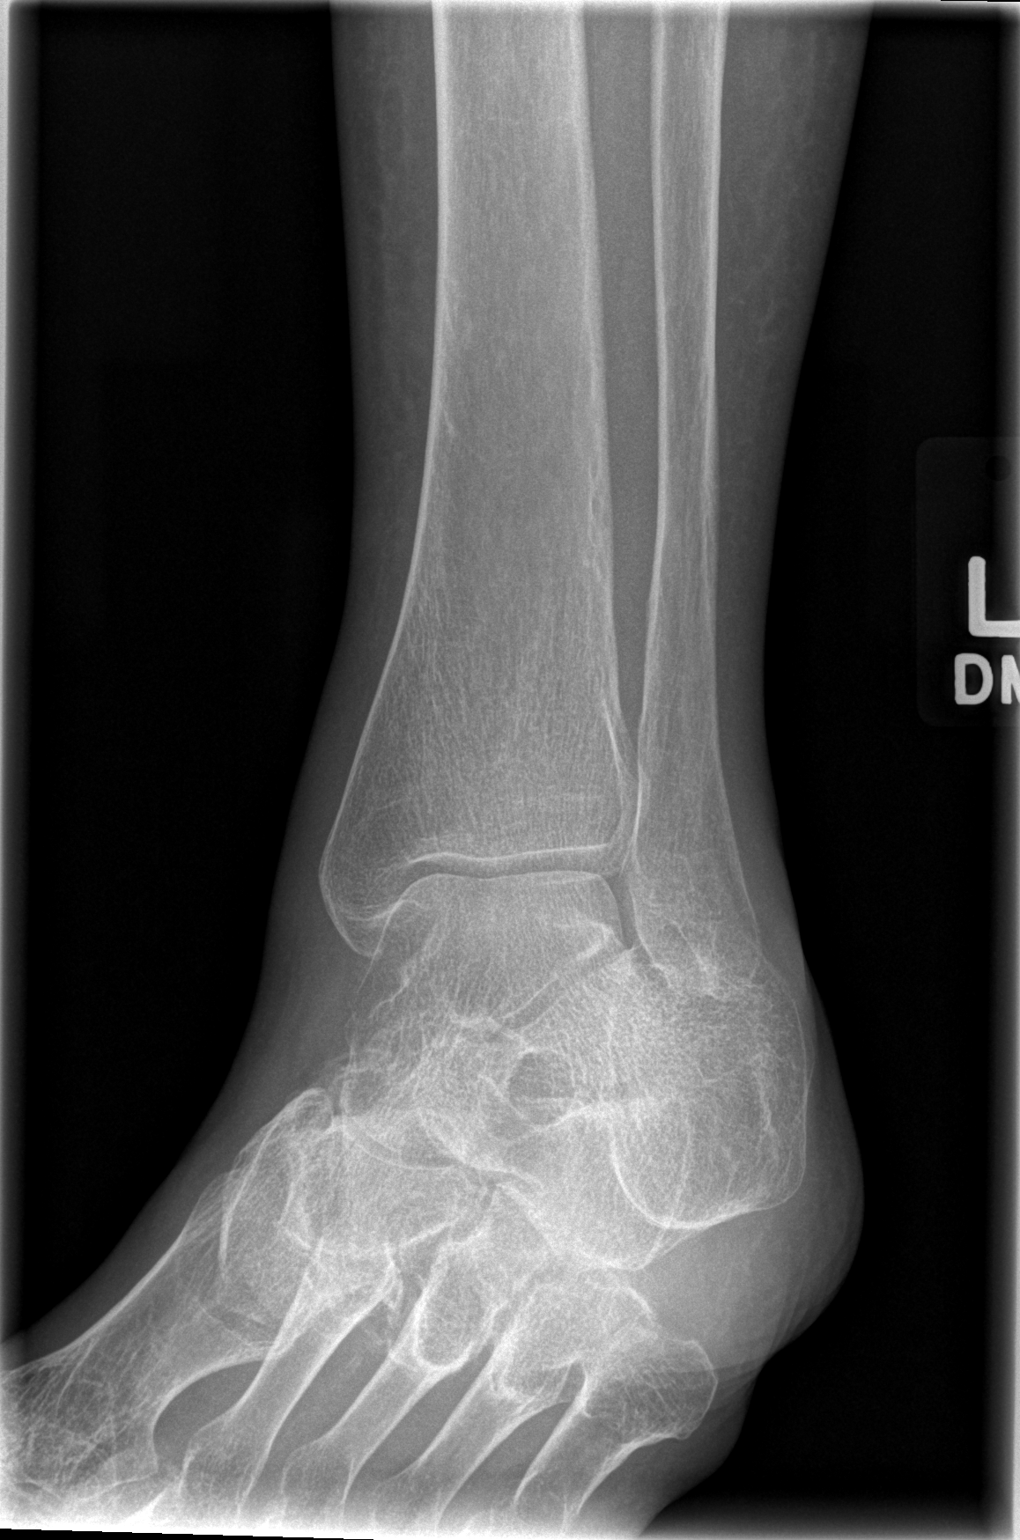

[t ankle joint lat left]
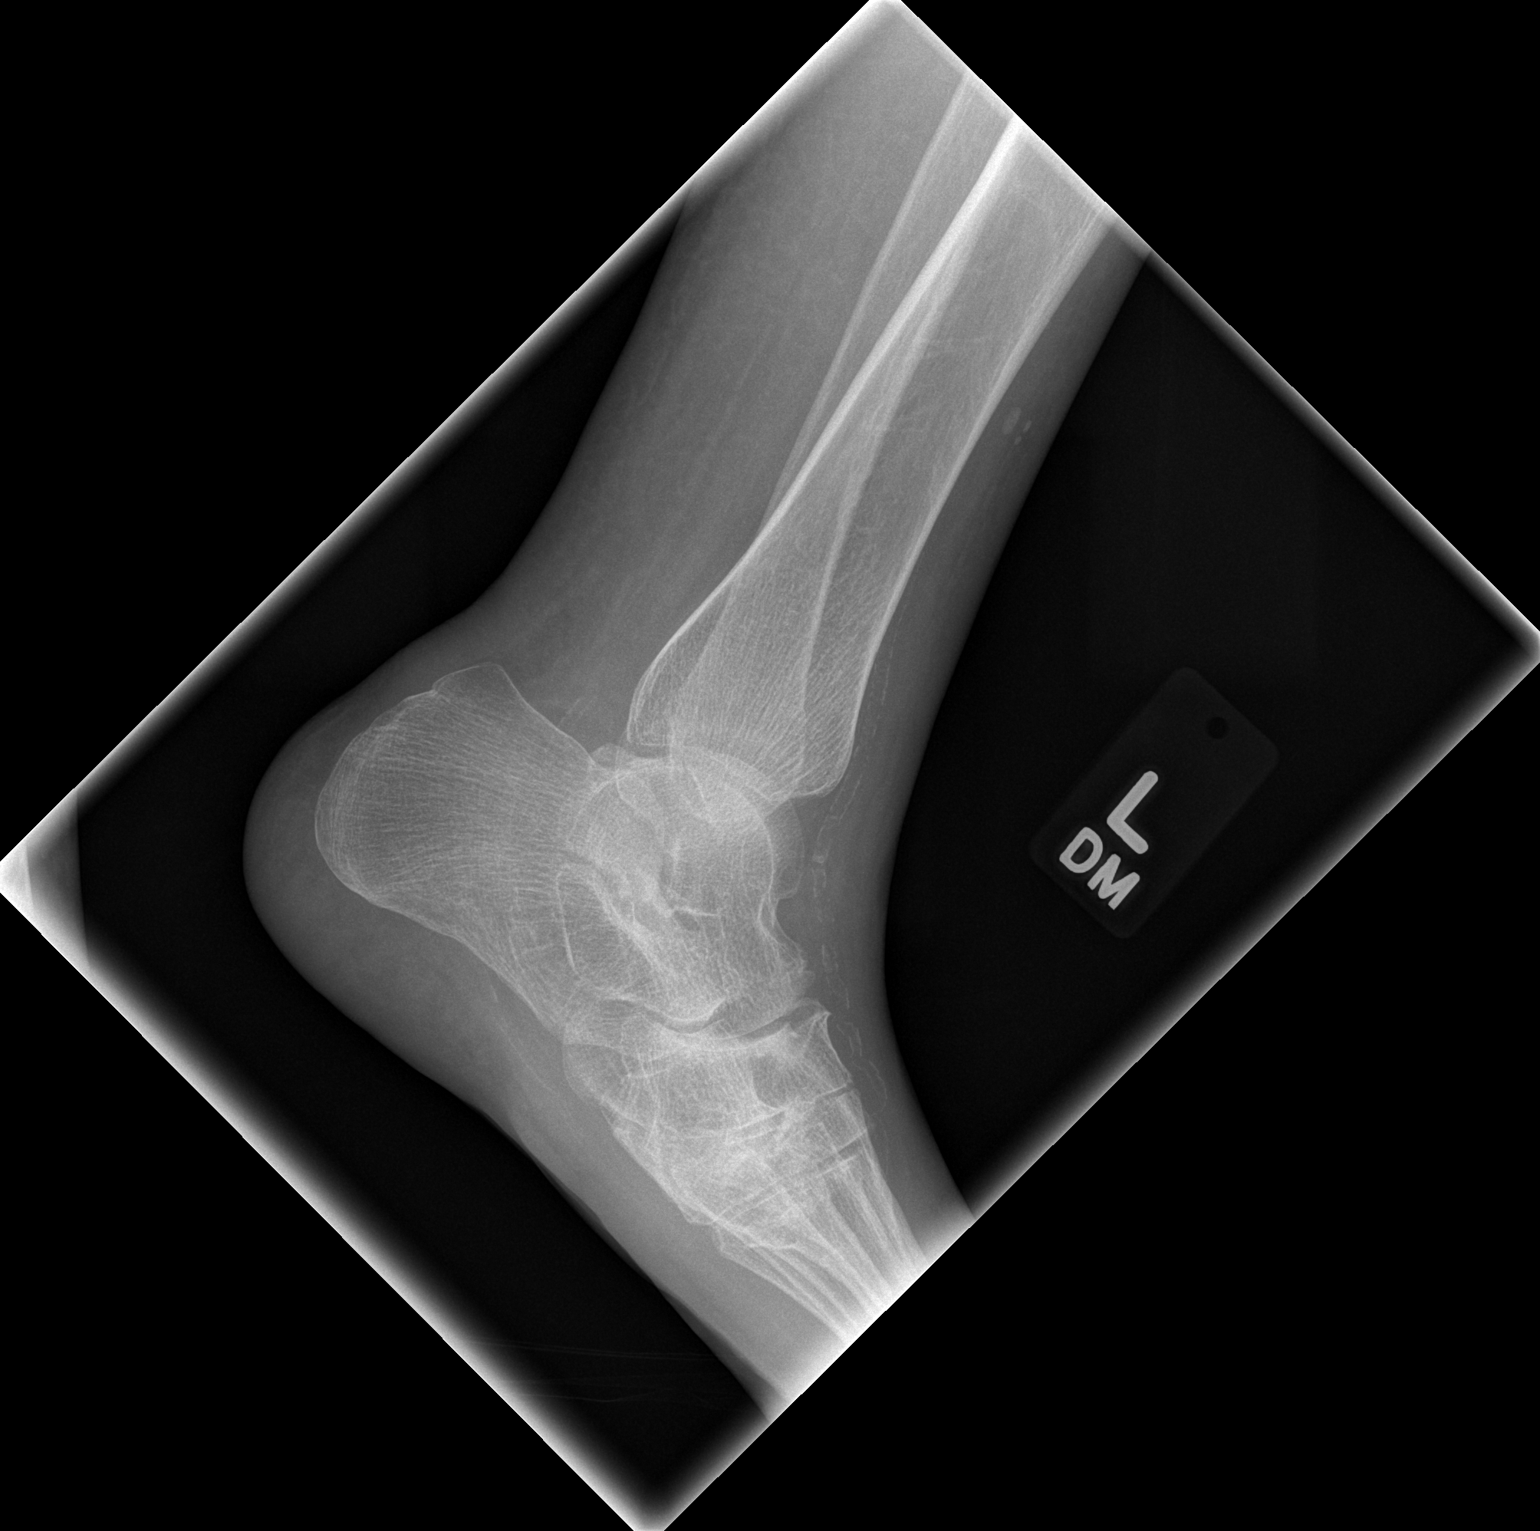

[3 of 3 positions shown; findings below may reference images not displayed]

FINDINGS: No acute fracture or malalignment. The bones are osteopenic and
there is diffuse arterial calcification. Pes planus.
IMPRESSION: No acute osseous abnormality.

## 2014-11-02 ENCOUNTER — Encounter (HOSPITAL_COMMUNITY): Payer: Medicaid Other

## 2014-11-03 ENCOUNTER — Encounter (HOSPITAL_COMMUNITY): Payer: Medicaid Other

## 2014-11-03 ENCOUNTER — Encounter (HOSPITAL_BASED_OUTPATIENT_CLINIC_OR_DEPARTMENT_OTHER): Payer: Medicare Other | Attending: Surgery

## 2014-11-03 DIAGNOSIS — E10621 Type 1 diabetes mellitus with foot ulcer: Secondary | ICD-10-CM | POA: Insufficient documentation

## 2014-11-03 DIAGNOSIS — E10610 Type 1 diabetes mellitus with diabetic neuropathic arthropathy: Secondary | ICD-10-CM | POA: Insufficient documentation

## 2014-11-03 DIAGNOSIS — L97422 Non-pressure chronic ulcer of left heel and midfoot with fat layer exposed: Secondary | ICD-10-CM | POA: Insufficient documentation

## 2014-11-06 IMAGING — CR DG TOE 2ND 2+V*L*
3 series · 3 of 3 positions shown · non-contrast
Comparison: None.

CLINICAL DATA: Diabetic foot

EXAM:
LEFT SECOND TOE

[t toes ap left]
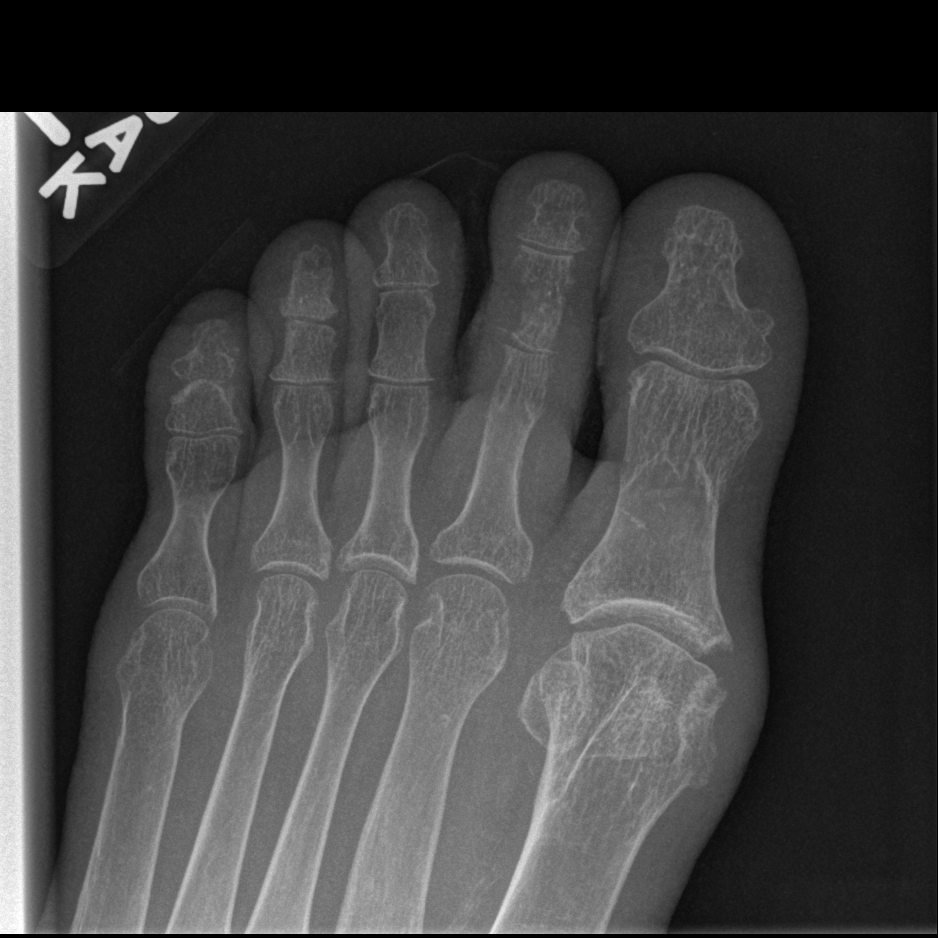

[t toes oblique left]
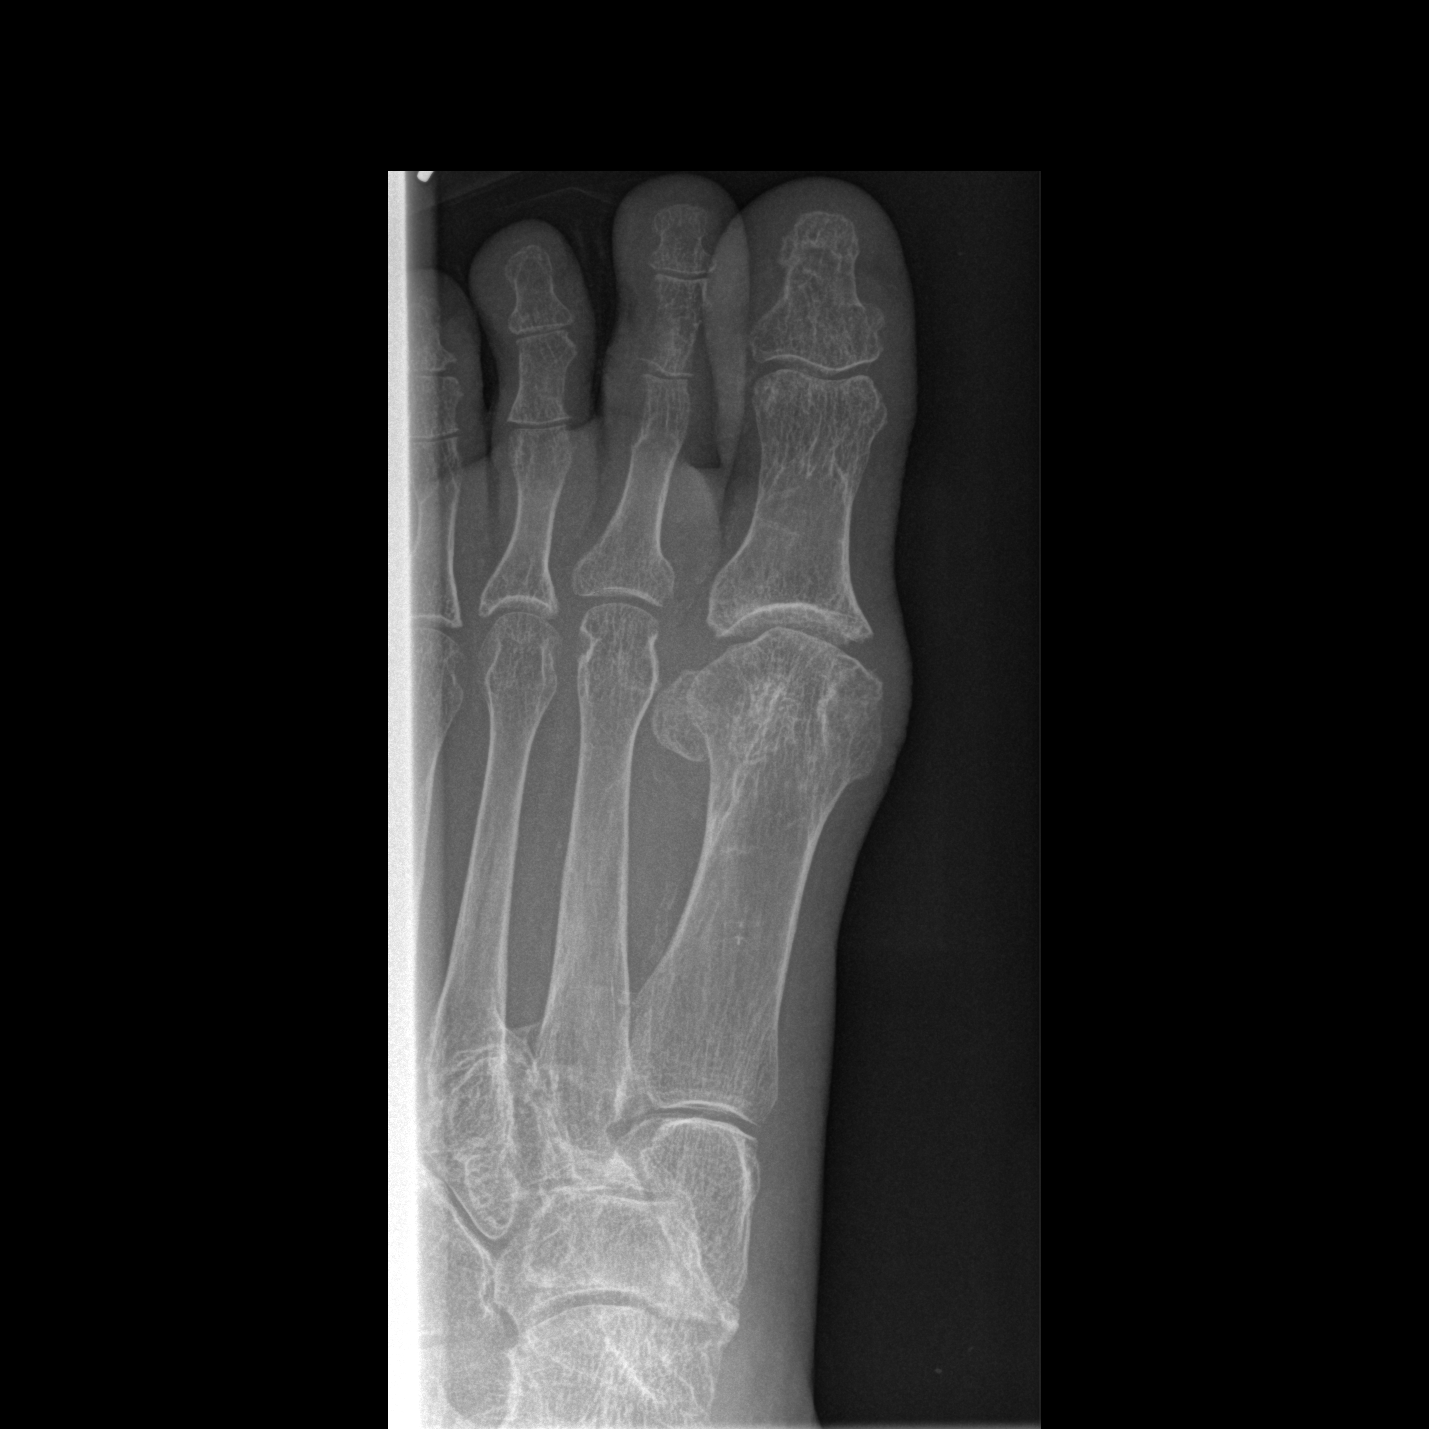

[t toes lateral left]
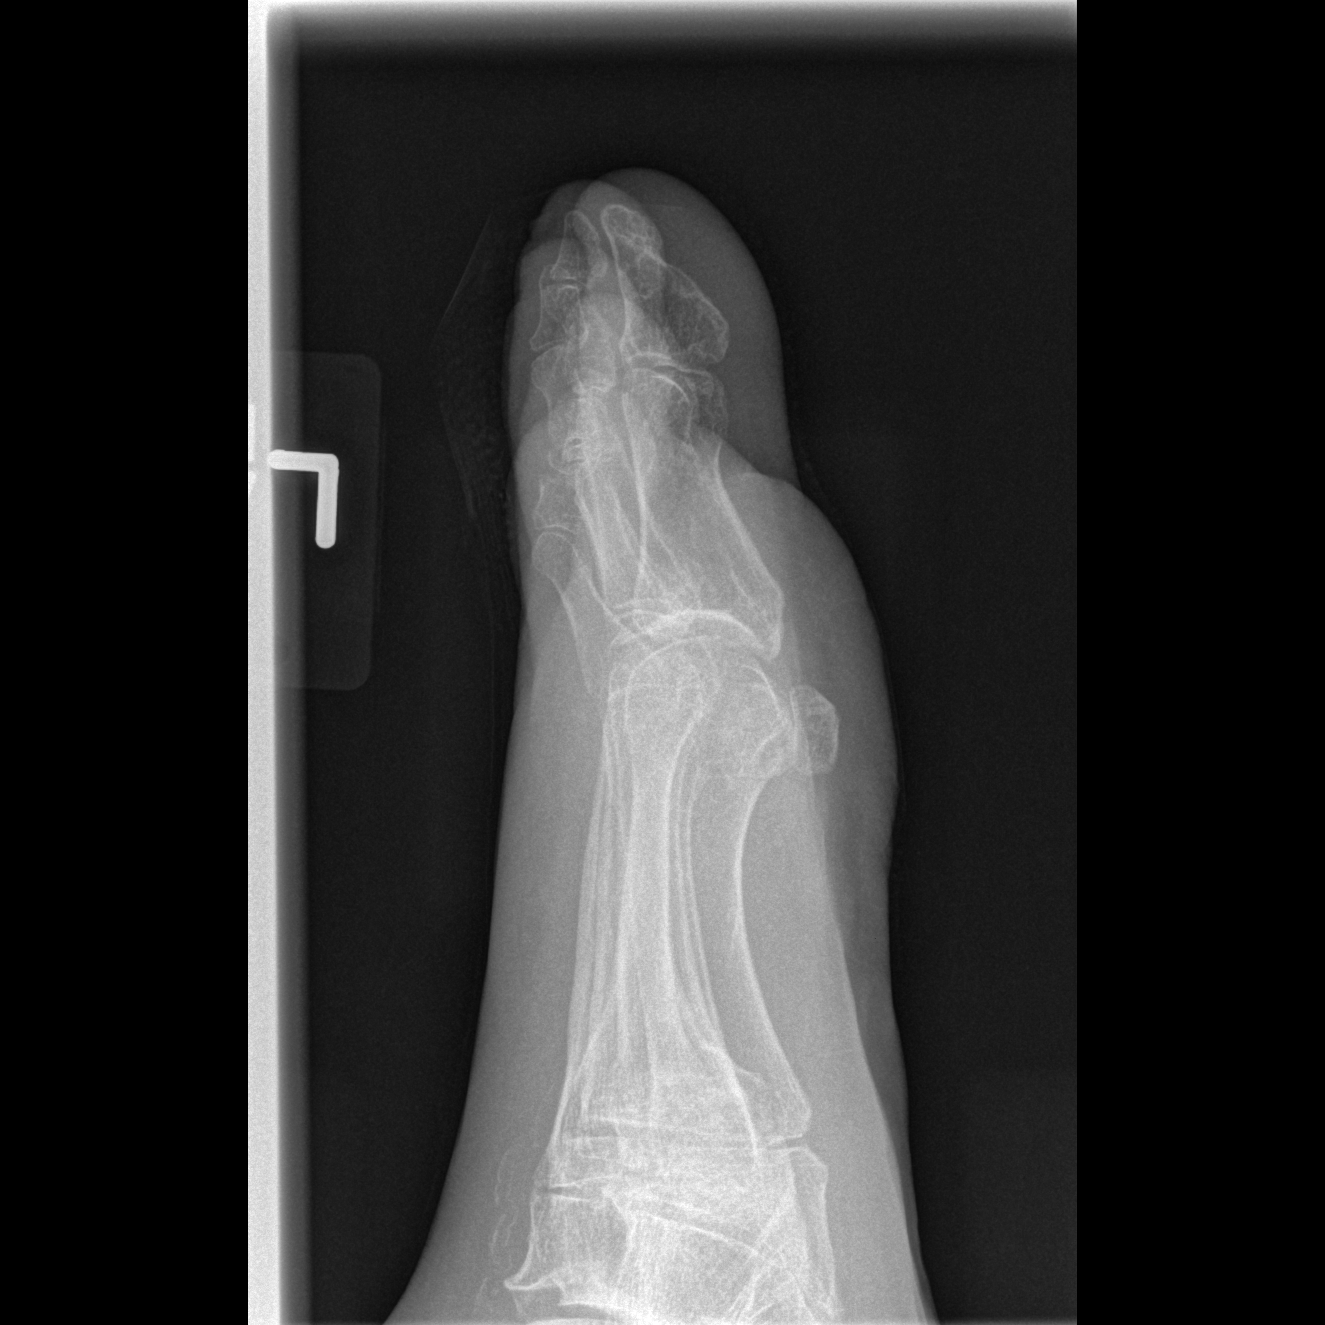

[3 of 3 positions shown; findings below may reference images not displayed]

FINDINGS: There is cortical erosion of the distal portion of the second
proximal phalanx and throughout most of the second middle phalanx
consistent with osteomyelitis. No fracture.

Mild degenerative change in the first MTP.
IMPRESSION: Osteomyelitis of the second proximal and mid phalanges.

## 2014-11-07 IMAGING — MR MR FOOT*L* W/O CM
4 of 6 series · 19 of 40 positions shown · non-contrast
Comparison: None.

CLINICAL DATA: Soft tissue ulceration of the second toe of the left
foot. Abnormal radiographs.

EXAM:
MRI OF THE LEFT FOREFOOT WITHOUT CONTRAST
TECHNIQUE: Multiplanar, multisequence MR imaging was performed. No intravenous
contrast was administered.

[Series 3: T1 · coronal · 4.0mm · 0.29mm/px · 9 of 32 slices shown (1 of 2)]
[im 1/32]
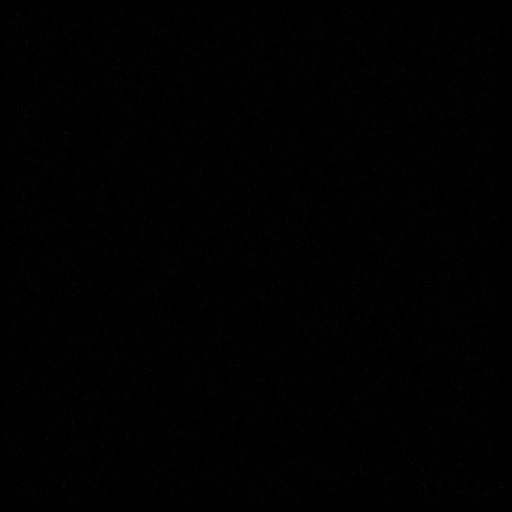
[im 4/32]
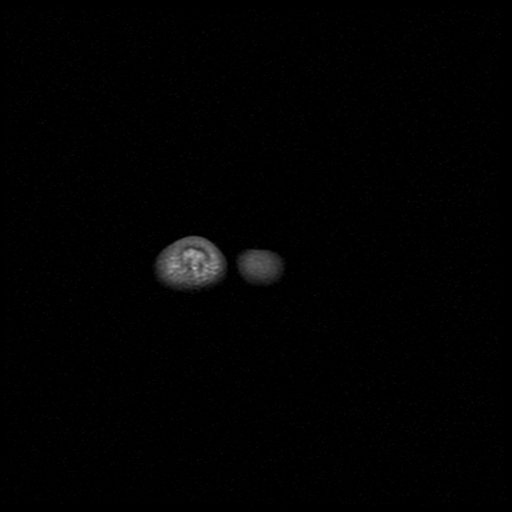
[im 8/32]
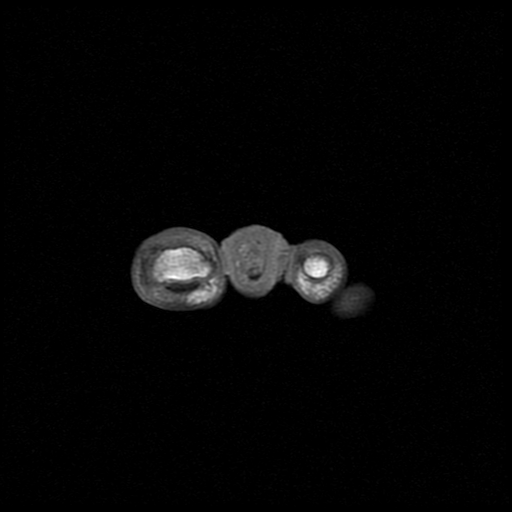
[im 12/32]
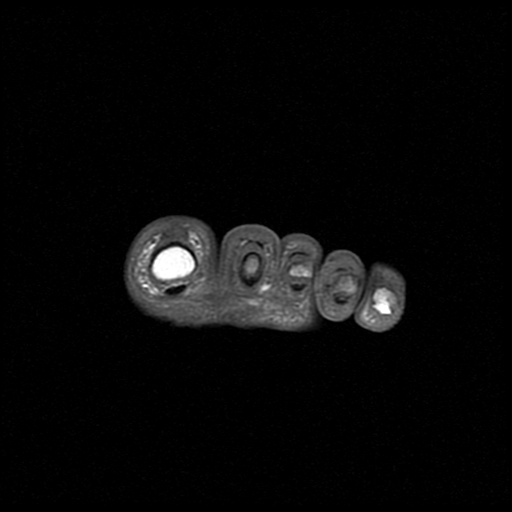
[im 16/32]
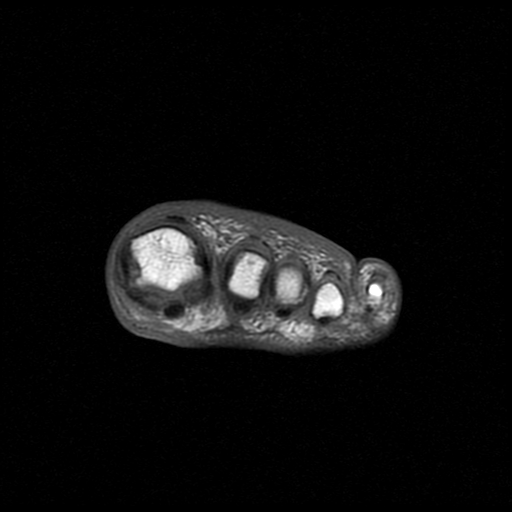
[im 20/32]
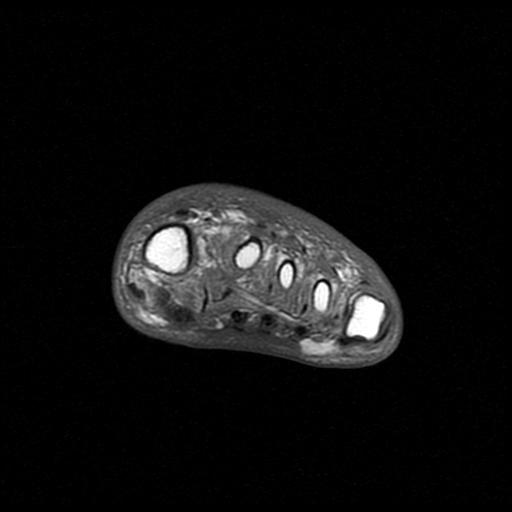
[im 24/32]
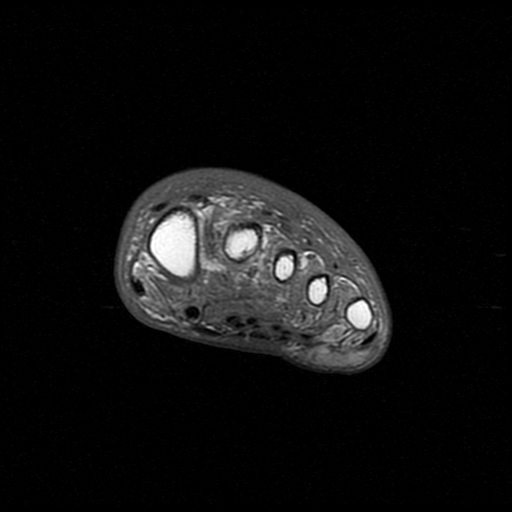
[im 28/32]
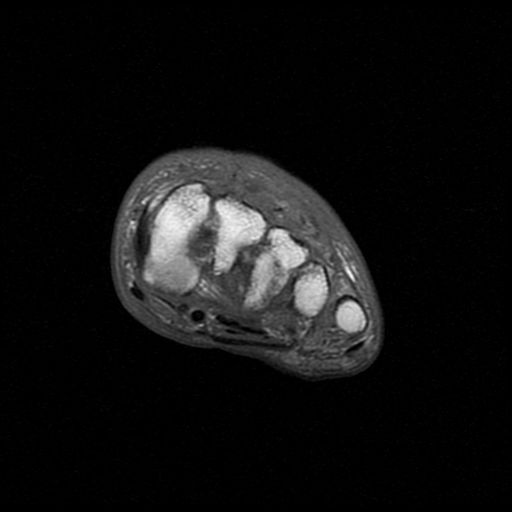
[im 32/32]
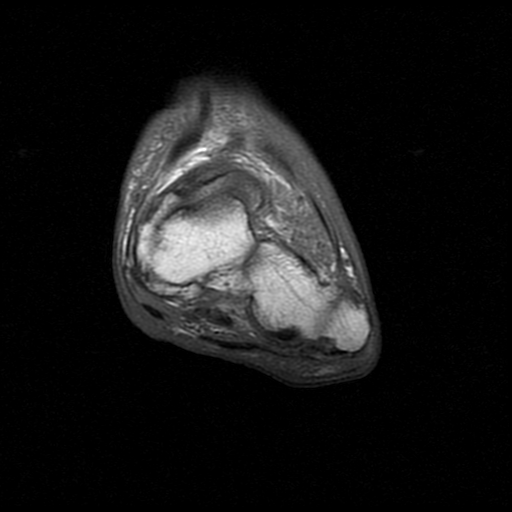

[Series 4: T2 · coronal · 4.0mm · 0.29mm/px · 3 of 32 slices shown]
[im 4/32]
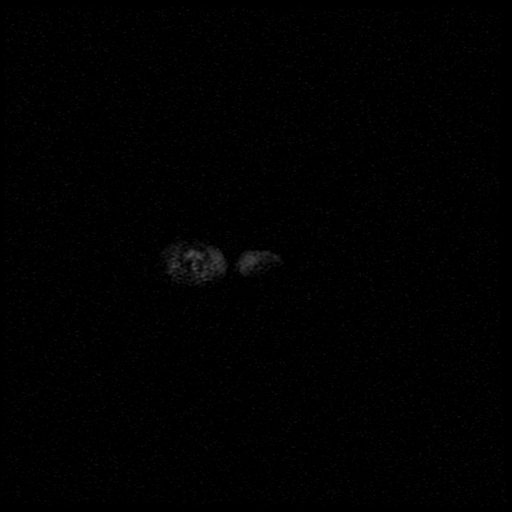
[im 18/32]
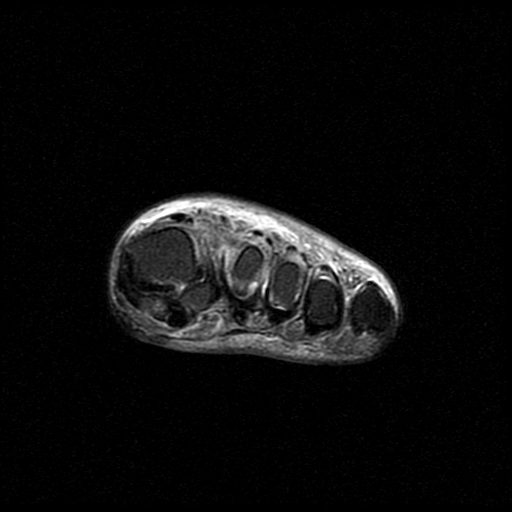
[im 28/32]
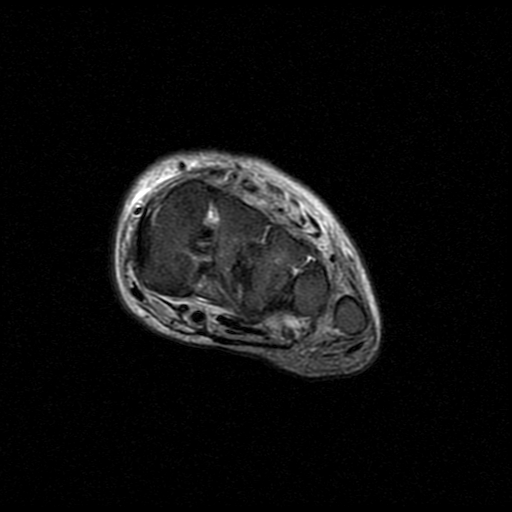

[Series 5: T1 · axial · 4.0mm · 0.31mm/px · z∈[-78,-12]mm · 4 of 15 slices shown (2 of 2)]
[im 1/15]
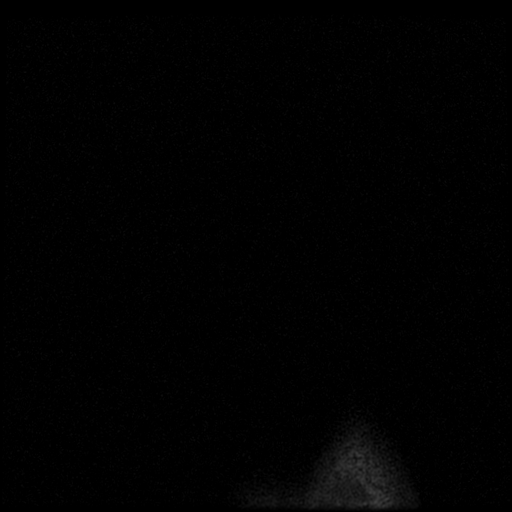
[im 4/15]
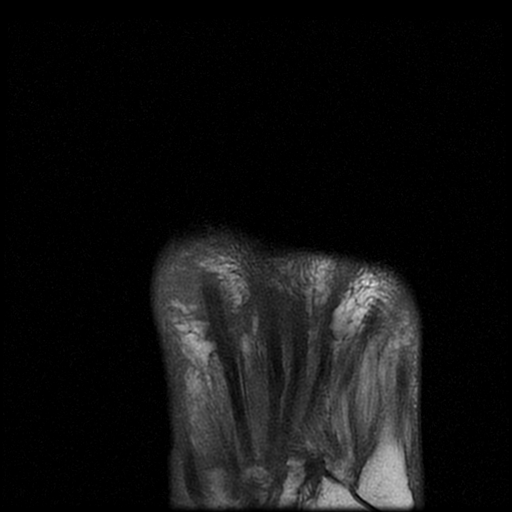
[im 8/15]
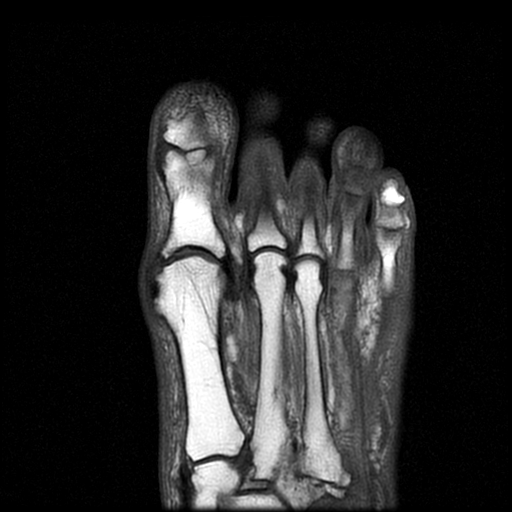
[im 15/15]
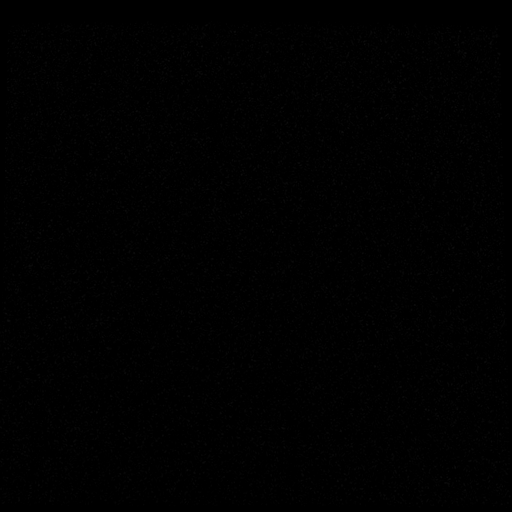

[Series 6: STIR · sagittal · 4.0mm · 0.31mm/px · 3 of 20 slices shown]
[im 4/20]
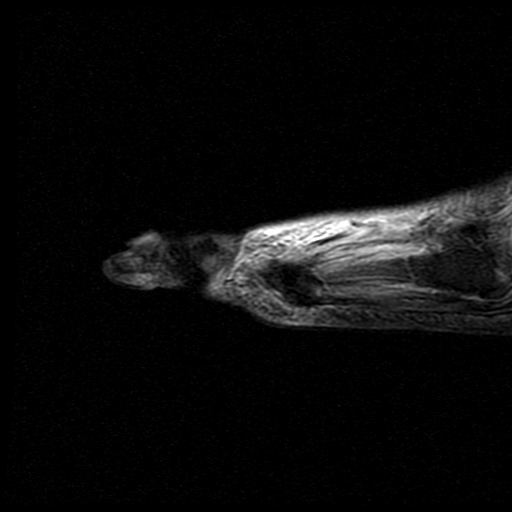
[im 12/20]
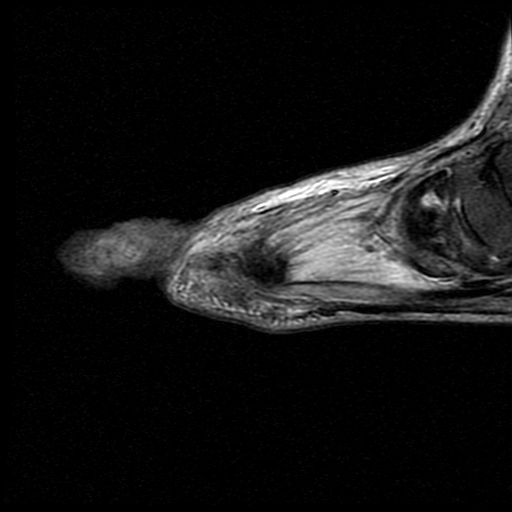
[im 20/20]
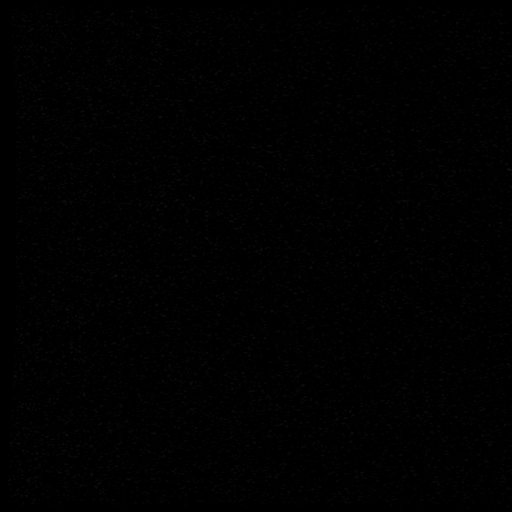

[19 of 40 positions shown; findings below may reference images not displayed]

FINDINGS: There is abnormal edema in the phalangeal bones of the second third
and fourth toes without bone destruction. Edema is most intense
around the PIP joint of the second toe. There is slight degenerative
changes of the first metatarsal phalangeal joint.

There is also subcutaneous edema in the second, third and fourth
toes but most severe in the second toe. No appreciable joint
effusions. Air subcutaneous edema on the dorsum of the foot.
IMPRESSION: 1. Cellulitis of the second third and fourth toes of the left foot.
2. Edema in the bones of those toes without bone destruction or
joint effusion. This edema could be secondary to hyperemia due to
the cellulitis. The findings are not definitive for osteomyelitis.

## 2014-11-09 ENCOUNTER — Encounter (HOSPITAL_COMMUNITY): Payer: Medicaid Other

## 2014-11-10 ENCOUNTER — Encounter (HOSPITAL_COMMUNITY): Payer: Medicaid Other

## 2014-11-16 ENCOUNTER — Encounter (HOSPITAL_COMMUNITY): Payer: Medicaid Other

## 2014-11-17 ENCOUNTER — Encounter (HOSPITAL_COMMUNITY): Payer: Medicaid Other

## 2014-11-17 DIAGNOSIS — E10621 Type 1 diabetes mellitus with foot ulcer: Secondary | ICD-10-CM | POA: Diagnosis not present

## 2014-11-17 DIAGNOSIS — L97422 Non-pressure chronic ulcer of left heel and midfoot with fat layer exposed: Secondary | ICD-10-CM | POA: Diagnosis present

## 2014-11-17 DIAGNOSIS — E10610 Type 1 diabetes mellitus with diabetic neuropathic arthropathy: Secondary | ICD-10-CM | POA: Diagnosis not present

## 2014-11-17 LAB — GLUCOSE, CAPILLARY: Glucose-Capillary: 305 mg/dL — ABNORMAL HIGH (ref 65–99)

## 2014-11-20 IMAGING — CR DG CHEST 2V
2 series · 2 of 2 positions shown · non-contrast
Comparison: 03/25/2013

CLINICAL DATA: Preop for hyperbaric oxygen therapy

EXAM:
CHEST  2 VIEW

[w chest pa]
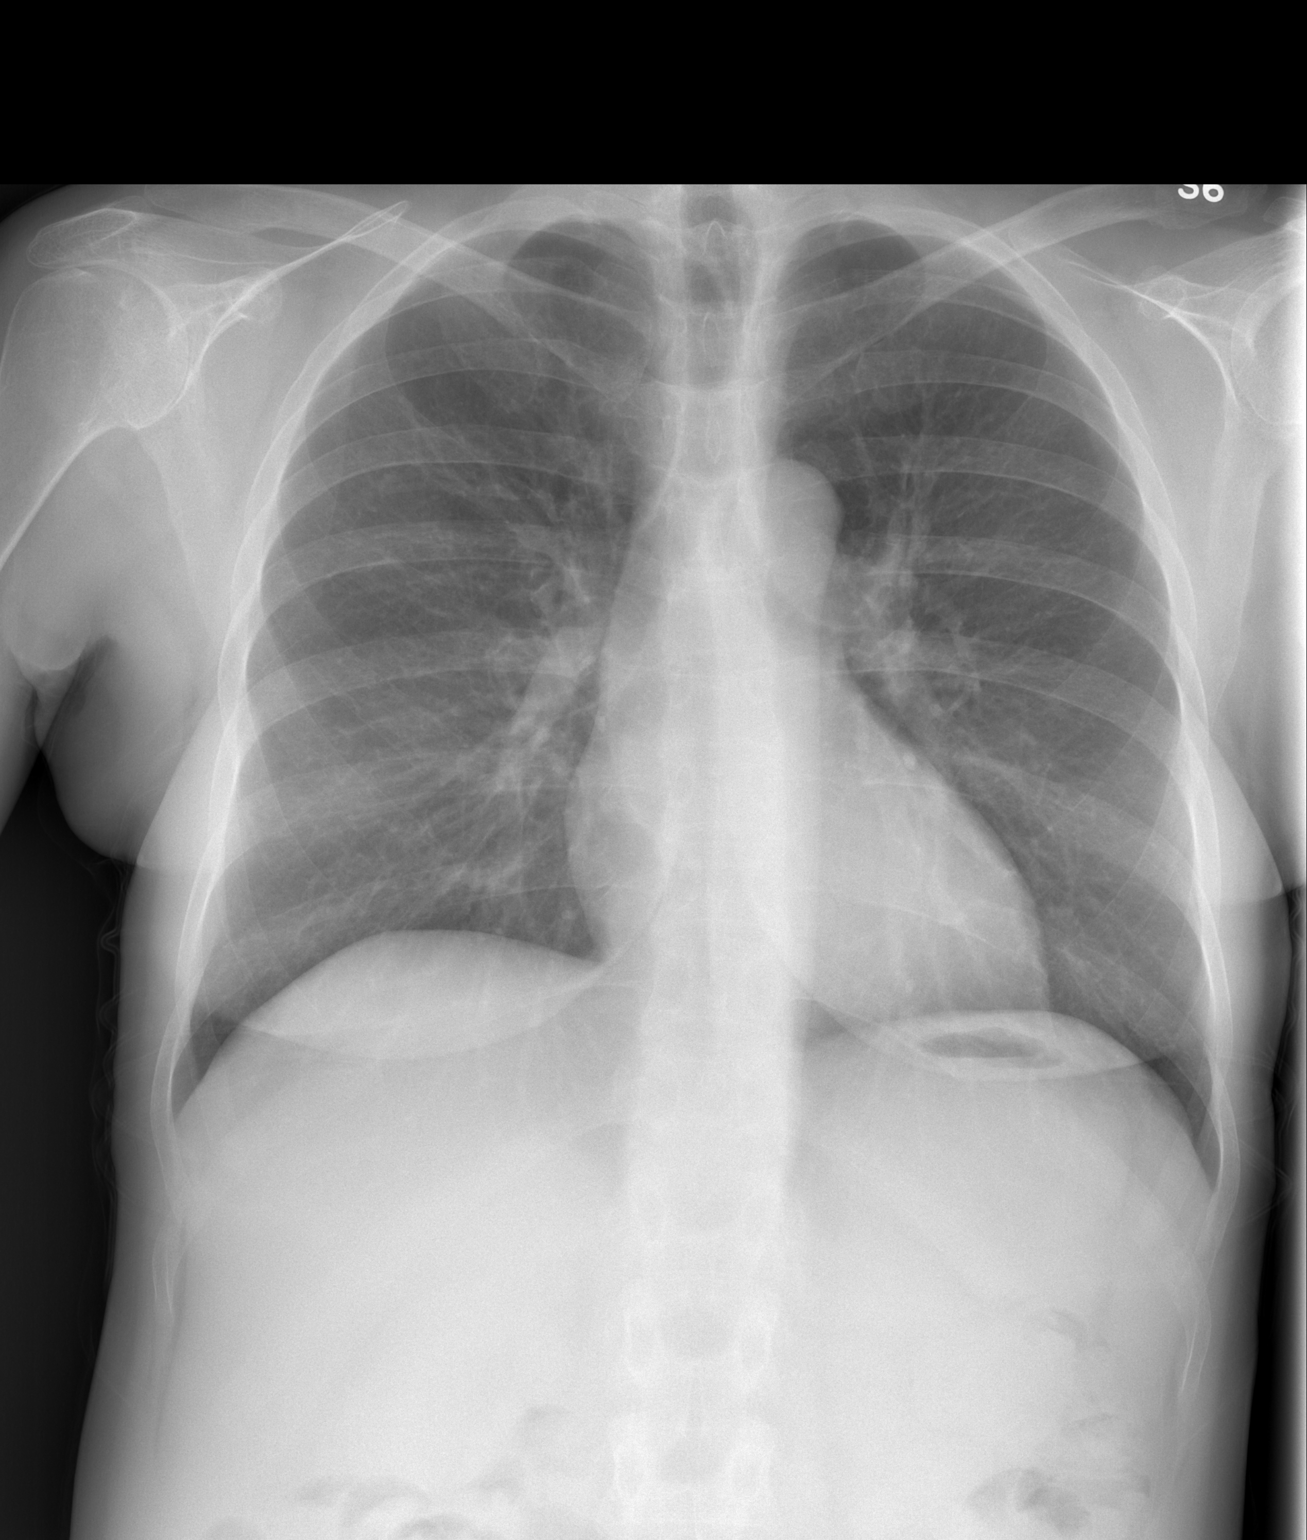

[w chest lat]
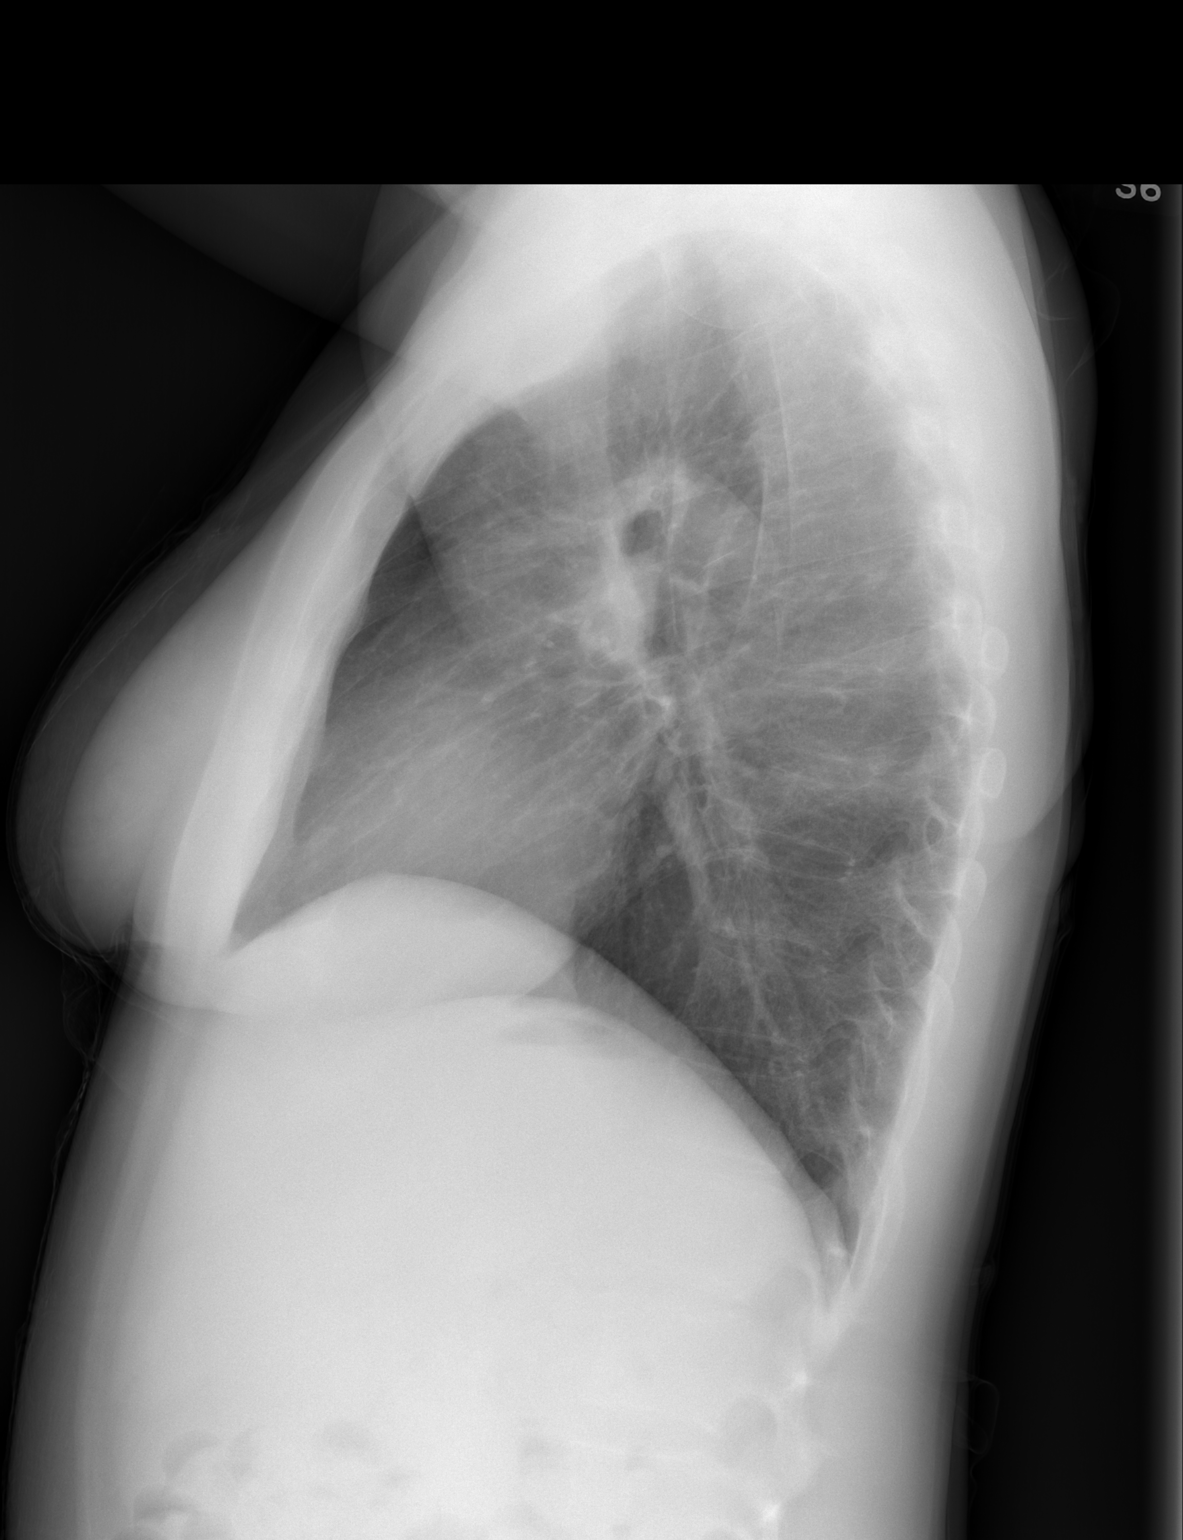

[2 of 2 positions shown; findings below may reference images not displayed]

FINDINGS: Cardiomediastinal silhouette is stable. No acute infiltrate or
pleural effusion. No pulmonary edema. Bony thorax is unremarkable.
IMPRESSION: No active cardiopulmonary disease.

## 2014-11-23 ENCOUNTER — Encounter (HOSPITAL_COMMUNITY): Payer: Medicaid Other

## 2014-11-24 ENCOUNTER — Encounter (HOSPITAL_COMMUNITY): Payer: Medicaid Other

## 2014-11-30 ENCOUNTER — Encounter (HOSPITAL_COMMUNITY): Payer: Medicaid Other

## 2014-12-01 ENCOUNTER — Encounter (HOSPITAL_COMMUNITY): Payer: Medicaid Other

## 2014-12-07 ENCOUNTER — Encounter (HOSPITAL_COMMUNITY): Payer: Medicaid Other

## 2014-12-08 ENCOUNTER — Encounter (HOSPITAL_COMMUNITY): Payer: Medicaid Other

## 2014-12-11 ENCOUNTER — Other Ambulatory Visit: Payer: Self-pay | Admitting: Endocrinology

## 2014-12-13 ENCOUNTER — Telehealth: Payer: Self-pay | Admitting: Endocrinology

## 2014-12-13 NOTE — Telephone Encounter (Signed)
Team health note dated 12/11/14 10:34 PM Chief Complaint Prescription Refill or Medication Request (non symptomatic) Initial Comment Caller States leaving at 11:30 AM today for vacation and needs Rx refilled, no refills left Nurse Assessment Nurse: Venetia Maxon, RN, Manuela Schwartz Date/Time (Eastern Time): 12/11/2014 10:34:29 AM Confirm and document reason for call. If symptomatic, describe symptoms. ---Caller States leaving at 11:30 AM today for vacation [place]. and needs Rx refilled, no refills left She was in hospital for a long time and got out a week ago. She was in hospital for infection in her foot . no fever CBS 150 ac bkfst . She is on abx for the foot. She is out of Novolog uses with pump . [facility] [phone] Has the patient traveled out of the country within the last 30 days? ---No Does the patient require triage? ---Declined Triage Please document clinical information provided and list any resource used. ---RN will call in the medication. advised pt to call back for any symptoms of illness. Caller verbalized understanding. Guidelines Guideline Title Affirmed Question Affirmed Notes Nurse Date/Time (Eastern Time) Disp. Time Eilene Ghazi Time) Disposition Final User 12/11/2014 10:45:16 AM Pharmacy Call Venetia Maxon, RN, Manuela Schwartz Reason: [facility] [phone] called to pharmacist Sharmar advised caller. 12/11/2014 10:45:32 AM Call Completed Venetia Maxon, RN, Manuela Schwartz 12/11/2014 10:44:19 AM Clinical Call Yes Venetia Maxon, RN, Manuela Schwartz After Care Instructions Given Call Event Type User Date / Time Description PLEASE NOTE: All timestamps contained within this report are represented as [place] Standard Time. CONFIDENTIALTY NOTICE: This fax transmission is intended only for the addressee. It contains information that is legally privileged, confidential or otherwise protected from use or disclosure. If you are not the intended recipient, you are strictly prohibited from reviewing, disclosing, copying using or  disseminating any of this information or taking any action in reliance on or regarding this information. If you have received this fax in error, please notify us immediately by telephone so that we can arrange for its return to [place]. Phone: [phone], Toll-Free: [phone], Fax: [phone] Page: 2 of 3 Call Id: [id number] Verbal Orders/Maintenance Medications Medication Refill Route Dosage Regime Duration Admin Instructions User Name Novolog 100 units/ ML max 65 units per 24hrs Yes Subcutaneous Days dispense 30ML for pump no refills Whitaker, RN, Manuela Schwartz Novolog 100 units/ ML max 65 units per 24hrs Yes Subcutaneous Days dispense 30ML for refills

## 2014-12-13 NOTE — Telephone Encounter (Signed)
Patient was suppose to be seen in June for follow up, she needs an appointment before refills given.

## 2014-12-14 ENCOUNTER — Encounter (HOSPITAL_COMMUNITY): Payer: Medicaid Other

## 2014-12-15 ENCOUNTER — Encounter (HOSPITAL_COMMUNITY): Payer: Medicaid Other

## 2014-12-21 ENCOUNTER — Encounter (HOSPITAL_COMMUNITY): Payer: Medicaid Other

## 2014-12-28 ENCOUNTER — Encounter (HOSPITAL_COMMUNITY): Payer: Medicaid Other

## 2014-12-28 DIAGNOSIS — R609 Edema, unspecified: Secondary | ICD-10-CM | POA: Insufficient documentation

## 2014-12-29 ENCOUNTER — Encounter (HOSPITAL_COMMUNITY): Payer: Medicaid Other

## 2015-01-05 ENCOUNTER — Encounter (HOSPITAL_COMMUNITY): Payer: Medicaid Other

## 2015-01-12 ENCOUNTER — Encounter (HOSPITAL_COMMUNITY): Payer: Medicaid Other

## 2015-01-19 ENCOUNTER — Encounter (HOSPITAL_COMMUNITY): Payer: Medicaid Other

## 2015-01-21 ENCOUNTER — Other Ambulatory Visit: Payer: Self-pay | Admitting: *Deleted

## 2015-01-21 ENCOUNTER — Telehealth: Payer: Self-pay | Admitting: Endocrinology

## 2015-01-21 MED ORDER — GABAPENTIN 100 MG PO CAPS: 100.0000 mg | ORAL_CAPSULE | Freq: Three times a day (TID) | ORAL | Status: DC

## 2015-01-21 NOTE — Telephone Encounter (Signed)
Okay to send 

## 2015-01-21 NOTE — Telephone Encounter (Signed)
Patient called and would like a refill on her Rx  Rx: Gabapentin    Pharmacy: [facility]    Thank you

## 2015-01-21 NOTE — Telephone Encounter (Signed)
She did schedule an appointment for 10/26, is it okay to send in a 30 day supply?

## 2015-01-21 NOTE — Telephone Encounter (Signed)
rx sent for a 30 day supply

## 2015-01-26 ENCOUNTER — Ambulatory Visit (INDEPENDENT_AMBULATORY_CARE_PROVIDER_SITE_OTHER): Payer: Medicare Other | Admitting: Endocrinology

## 2015-01-26 ENCOUNTER — Encounter: Payer: Self-pay | Admitting: Endocrinology

## 2015-01-26 ENCOUNTER — Encounter (HOSPITAL_COMMUNITY): Payer: Medicaid Other

## 2015-01-26 VITALS — BP 108/72 | HR 80 | Temp 98.4°F | Resp 14 | Ht 65.0 in | Wt 117.4 lb

## 2015-01-26 DIAGNOSIS — Z23 Encounter for immunization: Secondary | ICD-10-CM | POA: Diagnosis not present

## 2015-01-26 DIAGNOSIS — E1049 Type 1 diabetes mellitus with other diabetic neurological complication: Secondary | ICD-10-CM

## 2015-01-26 DIAGNOSIS — E1065 Type 1 diabetes mellitus with hyperglycemia: Secondary | ICD-10-CM

## 2015-01-26 DIAGNOSIS — IMO0002 Reserved for concepts with insufficient information to code with codable children: Secondary | ICD-10-CM

## 2015-01-26 LAB — URINALYSIS, ROUTINE W REFLEX MICROSCOPIC
Bilirubin Urine: NEGATIVE
Ketones, ur: NEGATIVE
Leukocytes, UA: NEGATIVE
Nitrite: POSITIVE — AB
Total Protein, Urine: 100 — AB
URINE GLUCOSE: 500 — AB
UROBILINOGEN UA: 0.2 (ref 0.0–1.0)
pH: 5.5 (ref 5.0–8.0)

## 2015-01-26 LAB — COMPREHENSIVE METABOLIC PANEL
ALBUMIN: 3.1 g/dL — AB (ref 3.5–5.2)
ALK PHOS: 181 U/L — AB (ref 39–117)
ALT: 48 U/L — ABNORMAL HIGH (ref 0–35)
AST: 32 U/L (ref 0–37)
BUN: 34 mg/dL — ABNORMAL HIGH (ref 6–23)
CALCIUM: 8.7 mg/dL (ref 8.4–10.5)
CHLORIDE: 109 meq/L (ref 96–112)
CO2: 18 mEq/L — ABNORMAL LOW (ref 19–32)
Creatinine, Ser: 1.41 mg/dL — ABNORMAL HIGH (ref 0.40–1.20)
GFR: 43.22 mL/min — ABNORMAL LOW (ref >60.00)
Glucose, Bld: 191 mg/dL — ABNORMAL HIGH (ref 70–99)
POTASSIUM: 4.6 meq/L (ref 3.5–5.1)
Sodium: 136 mEq/L (ref 135–145)
TOTAL PROTEIN: 6.9 g/dL (ref 6.0–8.3)
Total Bilirubin: 0.2 mg/dL (ref 0.2–1.2)

## 2015-01-26 LAB — MICROALBUMIN / CREATININE URINE RATIO
CREATININE, U: 90.8 mg/dL
Microalb Creat Ratio: 78.1 mg/g — ABNORMAL HIGH (ref 0.0–30.0)
Microalb, Ur: 70.9 mg/dL — ABNORMAL HIGH (ref 0.0–1.9)

## 2015-01-26 LAB — POCT GLYCOSYLATED HEMOGLOBIN (HGB A1C): Hemoglobin A1C: 9.3

## 2015-01-26 NOTE — Patient Instructions (Signed)
Check blood sugar at waking up time and before every meal is also bedtime. Also check some readings about 2 hours after a meal  BOLUS for all meals and snacks with carbohydrates. Have only small amounts of snacks and drinks with sugar   Call if blood sugars when you wake up are running below 120 consistently

## 2015-01-26 NOTE — Progress Notes (Signed)
Patient ID: [name], female   DOB: 1970-07-22, 44 y.o.   MRN: [id number]   Reason for Appointment: follow-up of diabetes  History of Present Illness   Diagnosis: Type 1 DIABETES MELITUS  Previous history: She has had long-standing poorly controlled diabetes and typically has poor compliance with self care measures despite periodic diabetes education and periodic followup  INSULIN PUMP regimen:   Basal rate: Midnight = 0.225, 5 AM = 0.25, 12 noon = 0.5. Total basal insulin 8.9 units daily  Carb coverage 1: 18 g and correction 1: 70 with target 150, active insulin 4 hours         Recent history:   She has not been seen in follow-up since 5/16, partly because of her recurrent hospitalization in [place] for foot ulcer and other problems  She has had much higher blood sugars in the last couple of weeks at least She claimed that her blood sugars were very good in the hospital when she was admitted but she was not eating much at all Her basal rates were reduced in the hospital including reduction by 0.1 at midnight  Current blood sugar patterns, difficulties with management and problems identified:  She is checking her blood sugars mostly once a day sporadically  She has only a couple of readings that are below 200 and has most readings in the upper 200 range  She had problem with her cannula being bent on 10/18 and her blood sugars were over 500 all day long and she did not call for assistance.  She thinks her blood sugars are high because she is eating irregularly and frequently eating sweets like ice cream or drinking drinks with sugar without bolusing  She has absolutely no motivation to improve her blood sugar control or follow instructions to check her sugar, give herself boluses before eating or pay attention to her diet.  She has various excuses for not being compliant and complaining of stress from other reasons  FASTING blood sugars are checked only  rarely and only on last Saturday her glucose was relatively better at 93 at about 1 PM; she says she is usually getting up around midday  She is not getting any hypoglycemia following boluses but also does not do a follow-up blood sugar after correction boluses frequently  She thinks her blood sugars were much better in the hospital when she was able to be better compliant with meals and boluses as well as with glucose monitoring Current blood sugar results from pump download: As discussed; above her average glucose on her download his [phone]  Meals: eating at variable times and also having snacks periodically.  Her daily carbohydrate intake is quite variable Physical activity: exercise: none         Dietician visit: Most recent:?          Complications: are: Neuropathy, nephropathy, diabetic foot ulcer      Wt Readings from Last 3 Encounters:  01/26/15 117 lb 6.4 oz (53.252 kg)  10/12/14 122 lb 9.2 oz (55.6 kg)  09/01/14 132 lb (59.875 kg)   Lab Results  Component Value Date   HGBA1C 9.3 01/26/2015   HGBA1C 8.6* 10/14/2014   HGBA1C 9.7* 09/01/2014   Lab Results  Component Value Date   MICROALBUR 108.7* 04/08/2013   LDLCALC 128* 07/31/2013   CREATININE 1.46* 10/18/2014        Medication List       This list is accurate as of: 01/26/15 12:31 PM.  Always  use your most recent med list.               albuterol 108 (90 BASE) MCG/ACT inhaler  Commonly known as:  PROVENTIL HFA;VENTOLIN HFA  Inhale 2 puffs into the lungs every 6 (six) hours as needed. S.O.B.     calcitRIOL 0.25 MCG capsule  Commonly known as:  ROCALTROL  Take 0.25 mcg by mouth every Monday, Wednesday, and Friday. M,W,F     [facility] CALCIUM 600 PLUS Chew  Chew 1,200 mg by mouth daily.     ferrous sulfate 325 (65 FE) MG tablet  Take 325 mg by mouth 3 (three) times daily with meals.     folic acid 1 MG tablet  Commonly known as:  FOLVITE  Take 1 mg by mouth daily.     furosemide 20 MG tablet  Commonly  known as:  LASIX  Take 20 mg by mouth daily as needed for fluid.     gabapentin 100 MG capsule  Commonly known as:  NEURONTIN  Take 1 capsule (100 mg total) by mouth 3 (three) times daily.     HYDROcodone-acetaminophen 10-325 MG tablet  Commonly known as:  NORCO  Take 1 tablet by mouth every 4 (four) hours as needed for moderate pain.     insulin pump Soln  Inject into the skin. Novolog     lactobacillus acidophilus Tabs tablet  Take 1 tablet by mouth 3 (three) times daily.     pravastatin 40 MG tablet  Commonly known as:  PRAVACHOL  TAKE 1/2 TABLETS (20 MG TOTAL) BY MOUTH DAILY.     pyridoxine 100 MG tablet  Commonly known as:  B-6  Take 100 mg by mouth daily.     Rivaroxaban 15 MG Tabs tablet  Commonly known as:  XARELTO  Take 1 tablet (15 mg total) by mouth 2 (two) times daily with a meal.     sodium bicarbonate 650 MG tablet  Take 650 mg by mouth 3 (three) times daily.     vitamin B-12 100 MCG tablet  Commonly known as:  CYANOCOBALAMIN  Take 200 mcg by mouth daily.        Allergies:  Allergies  Allergen Reactions  . Lisinopril Other (See Comments)    Elevated potassium per pt report  . Ciprofloxacin Itching  . Cleocin [Clindamycin Hcl]     Diarrhea     Past Medical History  Diagnosis Date  . Diabetes mellitus   . Neuropathy in diabetes ([place])   . Hypertension   . Hypercholesteremia   . H/O seasonal allergies   . Anemia   . Left foot infection     Past Surgical History  Procedure Laterality Date  . Cesarean section    . Eye surgery    . Hemrrhoidectomy    . Peripherally inserted central catheter insertion    . I&d extremity Right 06/10/2014    Procedure: IRRIGATION AND DEBRIDEMENT Right Foot;  Surgeon: Newt Minion, MD;  Location: [facility];  Service: Orthopedics;  Laterality: Right;  . Skin split graft Right 06/15/2014    Procedure: SPLIT THICKNESS SKIN GRAFT RIGHT FOOT;  Surgeon: Newt Minion, MD;  Location: [facility];  Service: Orthopedics;   Laterality: Right;    Family History  Problem Relation Age of Onset  . Hyperlipidemia Mother     Social History:  reports that she quit smoking about 6 years ago. She has never used smokeless tobacco. She reports that she drinks about 0.5 oz of alcohol  per week. She reports that she does not use illicit drugs.  Review of Systems:  Hypertension/CKD:   Her last creatinine from [facility] was 1.1 Blood pressure is quite normal  Lab Results  Component Value Date   CREATININE 1.46* 10/18/2014   BUN 40* 10/18/2014   NA 136 10/18/2014   K 5.2* 10/18/2014   CL 109 10/18/2014   CO2 20* 10/18/2014     Lipids: Has been  prescribed pravastatin but she does not take this, she wants to avoid taking medications  Lab Results  Component Value Date   CHOL 208* 07/05/2014   HDL 26.10* 07/05/2014   LDLCALC 128* 07/31/2013   LDLDIRECT 112.0 07/05/2014   TRIG 258.0* 07/05/2014   CHOLHDL 8 07/05/2014     She has been treated by nephrologist for anemia and has required iron infusion Has not gone back for follow-up recently  Lab Results  Component Value Date   WBC 4.8 10/17/2014   HGB 10.7* 10/17/2014   HCT 33.8* 10/17/2014   MCV 89.7 10/17/2014   PLT 386 10/17/2014   She has a Charcot foot recently 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     ASSESSMENT/ PLAN:   Diabetes type 1 with poor control and multiple complications    Her diabetes is still poorly controlled  See history of present illness for details of her blood sugar patterns, current management and problems identified. Her insulin was reduced during her hospitalization when she was eating very little and with intercurrent problems with her infection and DVT  Blood sugars are consistently high now including  on waking up, was over 200 today also She is very reluctant to adjust her insulin regimen because of fear of hypoglycemia However she continues to be eating poorly and eating sweets and drinking sugared drinks. She has no motivation to check her sugar consistently or bolus when she needs to as discussed above Discussed with the patient that since her blood sugars are consistently high throughout the day with inadequate boluses we should increase her basal rate to compensate for her hyperglycemia Also since the glucose is high waking up she needs to go back to the previous overnight basal rates that she was having before she was in the hospital Emphasized the fact that she is getting multiple and progressive complication from her diabetes and needs to do better with her control; however she still is quite unmotivated  DIABETIC nephropathy with nephrotic syndrome: Followed by nephrologist and needs to go back  Charcot foot: Followed in [place], will approve diabetic shoes  PLAN:  Basal rates to be as follows: Midnight = 0.275, 5 AM = 0.30, 1 PM = 0.5 Correction factor 1:60 Stressed importance of checking blood sugars 3-4 times a day To check blood sugars after correcting a high reading and also 2 hours after meals She will need to bolus for all carbohydrate intake consistently Will consider reducing her early morning basal if she has readings consistently under 120 Followup in 4 weeks  Counseling time on subjects discussed above is over 50% of today's 25 minute visit  Flu vaccine given  [facility] 01/26/2015, 12:31 PM

## 2015-02-01 ENCOUNTER — Encounter (HOSPITAL_COMMUNITY): Payer: Medicaid Other

## 2015-02-09 ENCOUNTER — Encounter (HOSPITAL_COMMUNITY): Payer: Medicaid Other

## 2015-02-16 ENCOUNTER — Other Ambulatory Visit: Payer: Self-pay | Admitting: *Deleted

## 2015-02-16 ENCOUNTER — Encounter (HOSPITAL_COMMUNITY): Payer: Medicaid Other

## 2015-02-16 ENCOUNTER — Other Ambulatory Visit: Payer: Self-pay | Admitting: Endocrinology

## 2015-02-16 MED ORDER — PRAVASTATIN SODIUM 40 MG PO TABS: ORAL_TABLET | ORAL | Status: DC

## 2015-02-16 NOTE — Telephone Encounter (Signed)
novolog and simvastatin are the only things Dr. Dwyane Dee prescribes, those were sent today.

## 2015-02-16 NOTE — Telephone Encounter (Signed)
Patient stated she need a refill of all her medication, [id number] [address] [place], [place] [phone] (Phone) [phone] (Fax)

## 2015-02-18 ENCOUNTER — Other Ambulatory Visit (INDEPENDENT_AMBULATORY_CARE_PROVIDER_SITE_OTHER): Payer: Medicare Other

## 2015-02-18 DIAGNOSIS — E1065 Type 1 diabetes mellitus with hyperglycemia: Secondary | ICD-10-CM

## 2015-02-18 DIAGNOSIS — IMO0002 Reserved for concepts with insufficient information to code with codable children: Secondary | ICD-10-CM

## 2015-02-18 DIAGNOSIS — E1049 Type 1 diabetes mellitus with other diabetic neurological complication: Secondary | ICD-10-CM | POA: Diagnosis not present

## 2015-02-18 LAB — LIPID PANEL
Cholesterol: 93 mg/dL (ref 0–200)
HDL: 16.1 mg/dL — ABNORMAL LOW (ref >39.00)
LDL CALC: 42 mg/dL (ref 0–99)
NonHDL: 76.4
TRIGLYCERIDES: 172 mg/dL — AB (ref 0.0–149.0)
Total CHOL/HDL Ratio: 6
VLDL: 34.4 mg/dL (ref 0.0–40.0)

## 2015-02-18 LAB — BASIC METABOLIC PANEL
BUN: 42 mg/dL — ABNORMAL HIGH (ref 6–23)
CO2: 17 meq/L — AB (ref 19–32)
Calcium: 8.2 mg/dL — ABNORMAL LOW (ref 8.4–10.5)
Chloride: 112 mEq/L (ref 96–112)
Creatinine, Ser: 1.59 mg/dL — ABNORMAL HIGH (ref 0.40–1.20)
GFR: 37.62 mL/min — ABNORMAL LOW (ref >60.00)
Glucose, Bld: 175 mg/dL — ABNORMAL HIGH (ref 70–99)
POTASSIUM: 4.6 meq/L (ref 3.5–5.1)
SODIUM: 139 meq/L (ref 135–145)

## 2015-02-21 ENCOUNTER — Telehealth: Payer: Self-pay | Admitting: Endocrinology

## 2015-02-21 NOTE — Telephone Encounter (Signed)
They have been faxed to them 3 times already, confirmation received all 3 times.

## 2015-02-21 NOTE — Telephone Encounter (Signed)
Caryl Pina from [facility] need patient most recent medical notes faxed to there office Fax [phone]  Phone #  [phone]

## 2015-02-22 ENCOUNTER — Encounter (HOSPITAL_COMMUNITY): Payer: Medicaid Other

## 2015-02-23 ENCOUNTER — Ambulatory Visit: Payer: Medicare Other | Admitting: Endocrinology

## 2015-02-28 ENCOUNTER — Encounter: Payer: Self-pay | Admitting: Endocrinology

## 2015-02-28 ENCOUNTER — Ambulatory Visit (INDEPENDENT_AMBULATORY_CARE_PROVIDER_SITE_OTHER): Payer: Medicare Other | Admitting: Endocrinology

## 2015-02-28 VITALS — BP 108/64 | HR 92 | Temp 98.1°F | Resp 14 | Ht 65.0 in | Wt 117.8 lb

## 2015-02-28 DIAGNOSIS — E1065 Type 1 diabetes mellitus with hyperglycemia: Secondary | ICD-10-CM

## 2015-02-28 DIAGNOSIS — E1121 Type 2 diabetes mellitus with diabetic nephropathy: Secondary | ICD-10-CM | POA: Diagnosis not present

## 2015-02-28 NOTE — Progress Notes (Signed)
Patient ID: [name], female   DOB: 08-Jan-1971, 44 y.o.   MRN: [id number]   Reason for Appointment: follow-up of diabetes  History of Present Illness   Diagnosis: Type 1 DIABETES MELITUS  Previous history: She has had long-standing poorly controlled diabetes and typically has poor compliance with self care measures despite periodic diabetes education and periodic followup  INSULIN PUMP regimen:   Basal rate: Midnight = 0.275, 5 AM = 0.3, 1p.m. = 0.6  Total basal insulin 10.4 units daily  Carb coverage 1: 18 g and correction 1: 70 with target 150, active insulin 4 hours         Recent history:   On her last visit in 10/16 her basal rates were increased slightly overnight and alsoher higher basal rate in the afternoon was delayed to 1 PM She has had somewhat better blood sugars now overall but no recent fructosamine or A1c available She is however complaining about the tendency to hypoglycemia periodically  Current blood sugar patterns, difficulties with management and problems identified:  She is checking her blood sugars somewhat more frequently than before  Her blood sugar is when she wakes up quite variable; she will sometimes wake up around 10 AM but mostly between 12 noon-2 PM and most of these blood sugars are high.  She has had sporadic HYPOGLYCEMIA a couple weeks ago at 2 PM and also 9 PM on 2 consecutive days and only once in the late afternoon last Friday  Although she is afraid of bolusing for her meals she is trying to do some correction boluses in the evenings and also some coverage for meals in the evenings randomly  She is still not eating properly meals are balanced meals and usually likes to eat more sweets because of lack of appetite for normal foods  On average he is bolusing only 1.4 times a day with a range of 0-3 times per day  Average glucose recently at home is 225, previously 448   Mean values apply above for all meters except median  for One [employer]  PRE-MEAL Fasting Lunch Dinner Bedtime Overall  Glucose range: 118-463  47-461 133-460   Mean/median:     225   Meals: eating at variable times and also having snacks periodically.  Her daily carbohydrate intake is quite variable Physical activity: exercise: none         Dietician visit: Most recent:?          Complications: are: Neuropathy, nephropathy, diabetic foot ulcer      Wt Readings from Last 3 Encounters:  02/28/15 117 lb 12.8 oz (53.434 kg)  01/26/15 117 lb 6.4 oz (53.252 kg)  10/12/14 122 lb 9.2 oz (55.6 kg)   Lab Results  Component Value Date   HGBA1C 9.3 01/26/2015   HGBA1C 8.6* 10/14/2014   HGBA1C 9.7* 09/01/2014   Lab Results  Component Value Date   MICROALBUR 70.9* 01/26/2015   LDLCALC 42 02/18/2015   CREATININE 1.59* 02/18/2015        Medication List       This list is accurate as of: 02/28/15  9:00 PM.  Always use your most recent med list.               albuterol 108 (90 BASE) MCG/ACT inhaler  Commonly known as:  PROVENTIL HFA;VENTOLIN HFA  Inhale 2 puffs into the lungs every 6 (six) hours as needed. S.O.B.     amoxicillin 250 MG capsule  Commonly known as:  AMOXIL  Take 250 mg by mouth 3 (three) times daily.     calcitRIOL 0.25 MCG capsule  Commonly known as:  ROCALTROL  Take 0.25 mcg by mouth every Monday, Wednesday, and Friday. M,W,F     [facility] CALCIUM 600 PLUS Chew  Chew 1,200 mg by mouth daily.     ferrous sulfate 325 (65 FE) MG tablet  Take 325 mg by mouth 3 (three) times daily with meals.     folic acid 1 MG tablet  Commonly known as:  FOLVITE  Take 1 mg by mouth daily.     furosemide 20 MG tablet  Commonly known as:  LASIX  Take 20 mg by mouth daily as needed for fluid.     gabapentin 100 MG capsule  Commonly known as:  NEURONTIN  Take 1 capsule (100 mg total) by mouth 3 (three) times daily.     insulin pump Soln  Inject into the skin. Novolog     lactobacillus acidophilus Tabs tablet  Take 1 tablet by  mouth 3 (three) times daily.     NOVOLOG 100 UNIT/ML injection  Generic drug:  insulin aspart  USE MAX 65 UNITS PER DAY WITH PUMP     pravastatin 40 MG tablet  Commonly known as:  PRAVACHOL  TAKE 1/2 TABLETS (20 MG TOTAL) BY MOUTH DAILY.     pyridoxine 100 MG tablet  Commonly known as:  B-6  Take 100 mg by mouth daily.     Rivaroxaban 15 MG Tabs tablet  Commonly known as:  XARELTO  Take 1 tablet (15 mg total) by mouth 2 (two) times daily with a meal.     sodium bicarbonate 650 MG tablet  Take 650 mg by mouth 3 (three) times daily.     vitamin B-12 100 MCG tablet  Commonly known as:  CYANOCOBALAMIN  Take 200 mcg by mouth daily.        Allergies:  Allergies  Allergen Reactions  . Lisinopril Other (See Comments)    Elevated potassium per pt report  . Ciprofloxacin Itching  . Cleocin [Clindamycin Hcl]     Diarrhea     Past Medical History  Diagnosis Date  . Diabetes mellitus   . Neuropathy in diabetes ([place])   . Hypertension   . Hypercholesteremia   . H/O seasonal allergies   . Anemia   . Left foot infection     Past Surgical History  Procedure Laterality Date  . Cesarean section    . Eye surgery    . Hemrrhoidectomy    . Peripherally inserted central catheter insertion    . I&d extremity Right 06/10/2014    Procedure: IRRIGATION AND DEBRIDEMENT Right Foot;  Surgeon: Newt Minion, MD;  Location: [facility];  Service: Orthopedics;  Laterality: Right;  . Skin split graft Right 06/15/2014    Procedure: SPLIT THICKNESS SKIN GRAFT RIGHT FOOT;  Surgeon: Newt Minion, MD;  Location: [facility];  Service: Orthopedics;  Laterality: Right;    Family History  Problem Relation Age of Onset  . Hyperlipidemia Mother     Social History:  reports that she quit smoking about 6 years ago. She has never used smokeless tobacco. She reports that she drinks about 0.5 oz of alcohol per week. She reports that she does not use illicit drugs.  Review of Systems:  Hypertension/CKD:   Her  last creatinine is. Relatively higher, to be followed by nephrologist Blood pressure is quite normal  Lab Results  Component Value Date  CREATININE 1.59* 02/18/2015   BUN 42* 02/18/2015   NA 139 02/18/2015   K 4.6 02/18/2015   CL 112 02/18/2015   CO2 17* 02/18/2015     Lipids: Has been  prescribed pravastatin lipids are much improved  Lab Results  Component Value Date   CHOL 93 02/18/2015   HDL 16.10* 02/18/2015   LDLCALC 42 02/18/2015   LDLDIRECT 112.0 07/05/2014   TRIG 172.0* 02/18/2015   CHOLHDL 6 02/18/2015     She has been treated by nephrologist for anemia and has required iron infusion   Lab Results  Component Value Date   WBC 4.8 10/17/2014   HGB 10.7* 10/17/2014   HCT 33.8* 10/17/2014   MCV 89.7 10/17/2014   PLT 386 10/17/2014   She has a Charcot foot  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     ASSESSMENT/ PLAN:   Diabetes type 1 with poor control and multiple complications    Her diabetes is still poorly controlled  See history of present illness for details of her blood sugar patterns, current management and problems identified. However on average her blood sugars at home are better, no objective labs available for assessment today Despite sporadic hypoglycemia she has been more compliant with checking her sugar, bolusing for meals and high sugars However she has significantly high blood sugars late at night around bedtime and also frequently when she wakes up. Only occasionally has had good readings in the 12 noon-5 PM time zone and only once has had no unexpected hypoglycemia in the early evening Overall highest blood sugars are after about 7 PM at night until bedtime  DIABETIC nephropathy with nephrotic syndrome: Followed by nephrologist  and creatinine appears somewhat variable but her potassium is okay  Lipids are better controlled with starting pravastatin and will continue   PLAN:  Basal rates to be as follows: Midnight = 0.3.  5 AM = 0.3, 2 PM = 0.5, 5 PM = 0.6 Correction factor 1:60 as before Increase frequency of checking blood sugars 3-4 times a day She will try to enter all her blood sugars in the pump even with the meter that does not blink  To check blood sugars after correcting a high reading and also 2 hours after meals She will need to bolus for all carbohydrate intake with meals and snacks consistently Followup in 6 weeks  Counseling time on subjects discussed above is over 50% of today's 25 minute visit   Ignatz Deis 02/28/2015, 9:00 PM

## 2015-03-02 ENCOUNTER — Encounter (HOSPITAL_COMMUNITY): Payer: Medicaid Other

## 2015-03-09 ENCOUNTER — Encounter (HOSPITAL_COMMUNITY): Payer: Medicaid Other

## 2015-03-16 ENCOUNTER — Encounter (HOSPITAL_COMMUNITY): Payer: Medicaid Other

## 2015-03-23 ENCOUNTER — Encounter (HOSPITAL_COMMUNITY): Payer: Medicaid Other

## 2015-03-27 IMAGING — CR DG KNEE COMPLETE 4+V*L*
4 series · 4 of 4 positions shown · non-contrast
Comparison: 06/21/2013

CLINICAL DATA: Patient fell down steps 1-1/2 weeks ago with knee
injury. Still with distal and medial pain.

EXAM:
LEFT KNEE - COMPLETE 4+ VIEW

[t knee ap left]
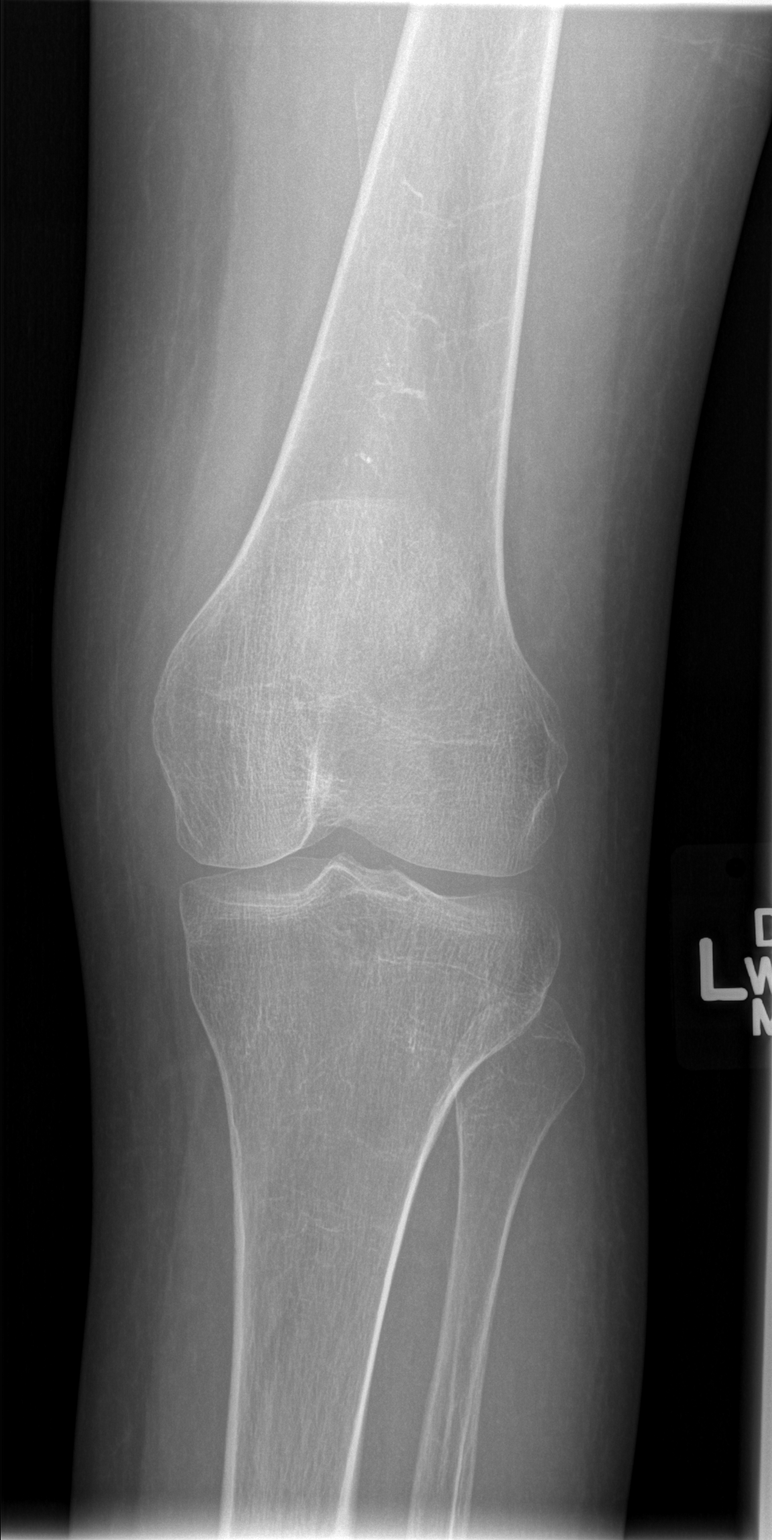

[t knee oblique left (1 of 2)]
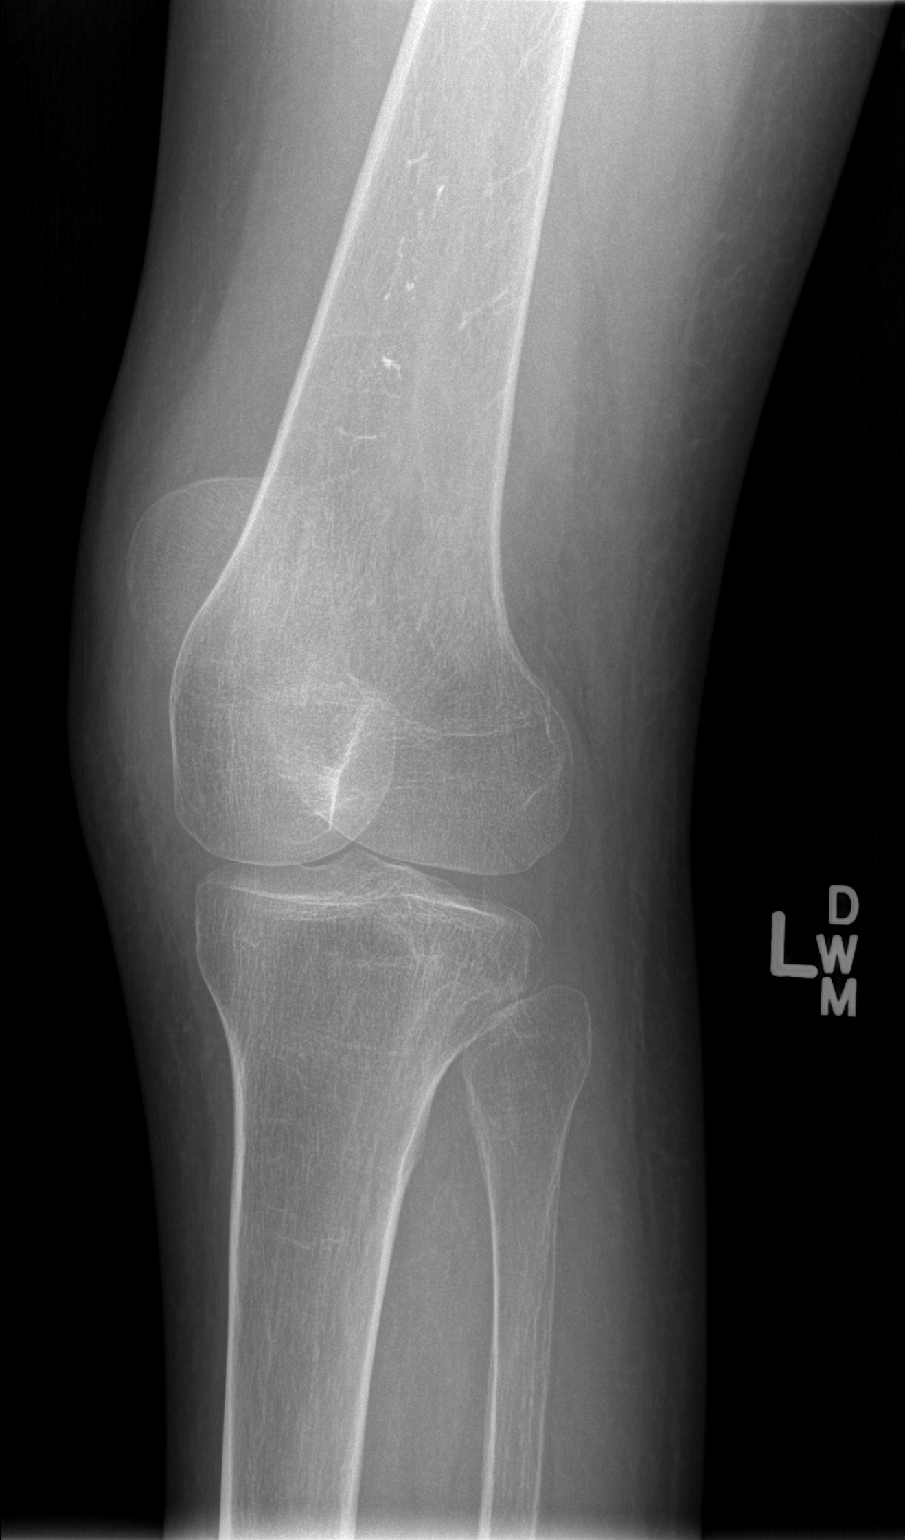

[t knee oblique left (2 of 2)]
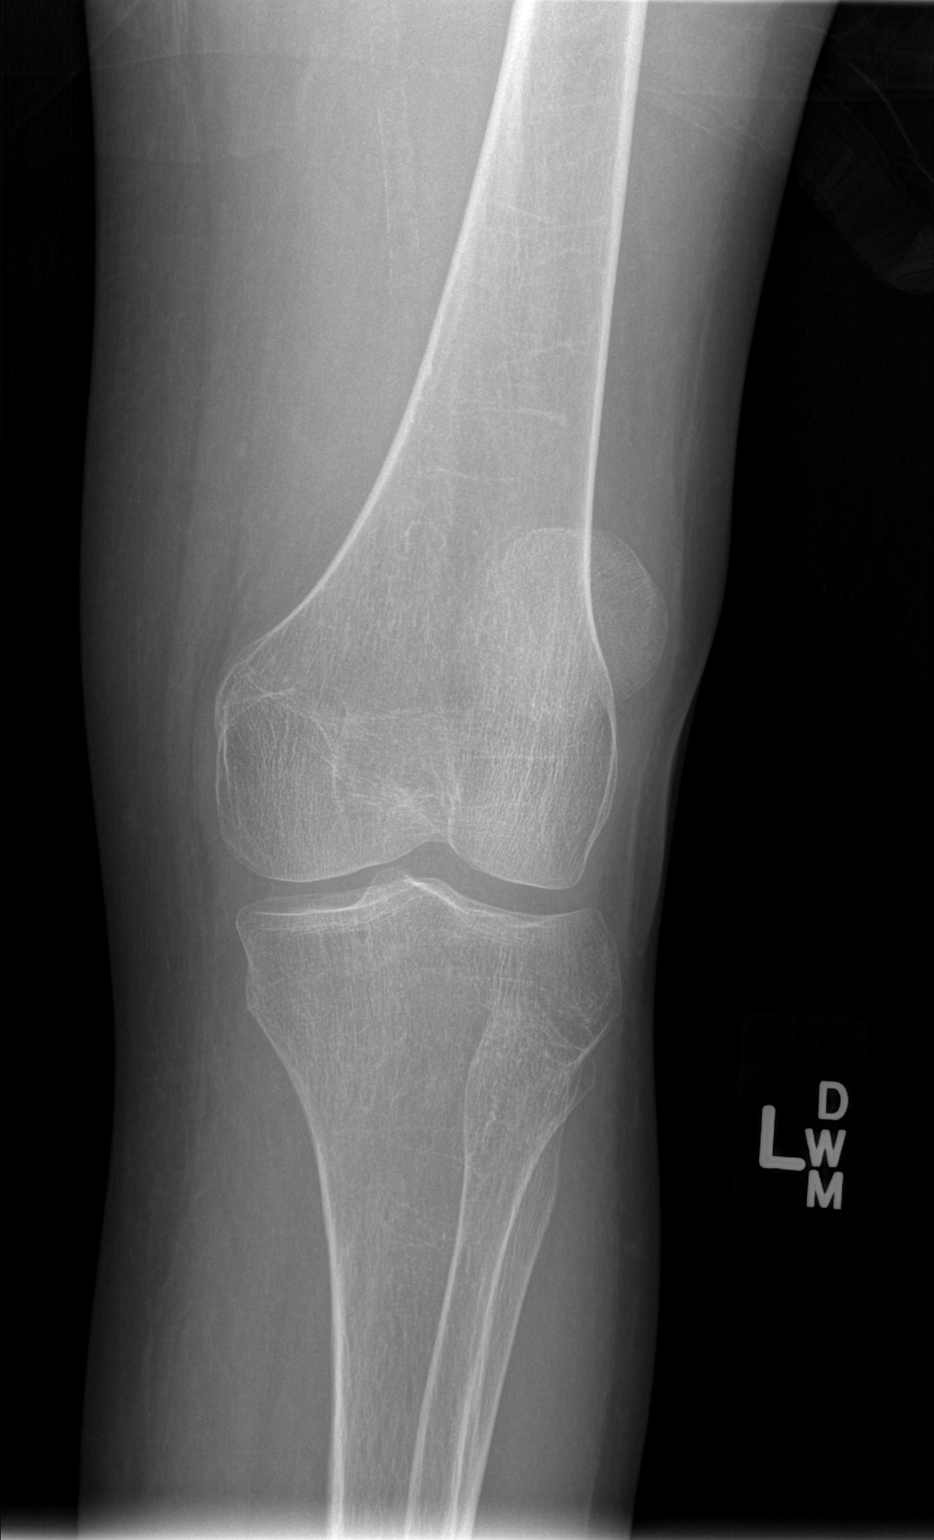

[t knee lat left]
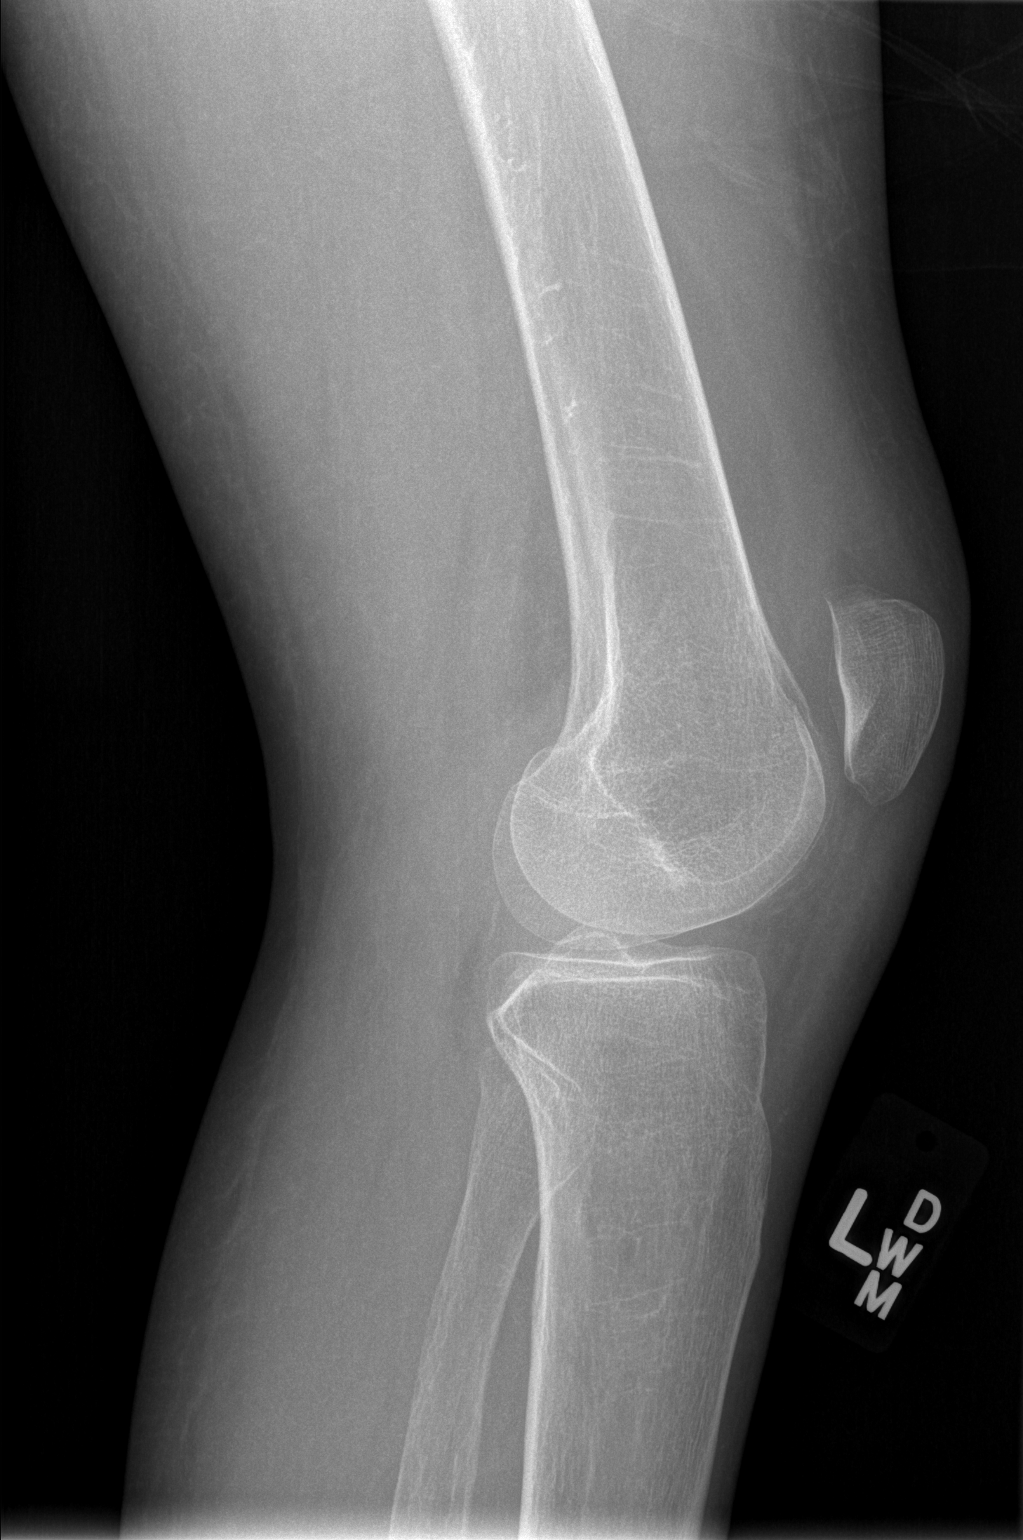

[4 of 4 positions shown; findings below may reference images not displayed]

FINDINGS: There is no evidence of fracture, dislocation, or joint effusion.
There is no evidence of arthropathy or other focal bone abnormality.
Soft tissues are unremarkable.
IMPRESSION: Negative.

## 2015-03-30 ENCOUNTER — Telehealth: Payer: Self-pay | Admitting: Endocrinology

## 2015-03-30 ENCOUNTER — Encounter (HOSPITAL_COMMUNITY): Payer: Medicaid Other

## 2015-03-30 NOTE — Telephone Encounter (Signed)
Please see below, I know it was sent in once, but it has not been scanned into her chart. There is another copy on your desk to sign.

## 2015-03-30 NOTE — Telephone Encounter (Signed)
Jennifer from [facility], stated they have not received anything from our office concerning patient diabetic, Dr Dwyane Dee need to co sign paperwork, and need the certification updated. fax # [phone]

## 2015-03-31 ENCOUNTER — Other Ambulatory Visit: Payer: Self-pay | Admitting: *Deleted

## 2015-03-31 MED ORDER — PRAVASTATIN SODIUM 40 MG PO TABS
ORAL_TABLET | ORAL | Status: DC
Start: 2015-03-31 — End: 2015-11-03

## 2015-03-31 NOTE — Telephone Encounter (Signed)
Sent back to you

## 2015-03-31 NOTE — Telephone Encounter (Signed)
I put it back on your desk and highlighted where you need to sign

## 2015-04-11 ENCOUNTER — Ambulatory Visit: Payer: Medicare Other | Admitting: Endocrinology

## 2015-04-15 ENCOUNTER — Encounter: Payer: Self-pay | Admitting: Endocrinology

## 2015-04-15 ENCOUNTER — Ambulatory Visit (INDEPENDENT_AMBULATORY_CARE_PROVIDER_SITE_OTHER): Payer: Medicare Other | Admitting: Endocrinology

## 2015-04-15 VITALS — BP 120/72 | Temp 98.7°F | Ht 65.0 in | Wt 125.0 lb

## 2015-04-15 DIAGNOSIS — N183 Chronic kidney disease, stage 3 (moderate): Secondary | ICD-10-CM

## 2015-04-15 DIAGNOSIS — E1065 Type 1 diabetes mellitus with hyperglycemia: Secondary | ICD-10-CM | POA: Diagnosis not present

## 2015-04-15 DIAGNOSIS — E7800 Pure hypercholesterolemia, unspecified: Secondary | ICD-10-CM | POA: Diagnosis not present

## 2015-04-15 LAB — HEMOGLOBIN A1C: Hgb A1c MFr Bld: 9.1 % — ABNORMAL HIGH (ref 4.6–6.5)

## 2015-04-15 LAB — COMPREHENSIVE METABOLIC PANEL
ALBUMIN: 3.1 g/dL — AB (ref 3.5–5.2)
ALK PHOS: 191 U/L — AB (ref 39–117)
ALT: 32 U/L (ref 0–35)
AST: 23 U/L (ref 0–37)
BILIRUBIN TOTAL: 0.2 mg/dL (ref 0.2–1.2)
BUN: 36 mg/dL — ABNORMAL HIGH (ref 6–23)
CHLORIDE: 108 meq/L (ref 96–112)
CO2: 17 meq/L — AB (ref 19–32)
CREATININE: 1.46 mg/dL — AB (ref 0.40–1.20)
Calcium: 8.4 mg/dL (ref 8.4–10.5)
GFR: 41.48 mL/min — ABNORMAL LOW (ref >60.00)
Glucose, Bld: 334 mg/dL — ABNORMAL HIGH (ref 70–99)
POTASSIUM: 4.8 meq/L (ref 3.5–5.1)
SODIUM: 133 meq/L — AB (ref 135–145)
Total Protein: 6.9 g/dL (ref 6.0–8.3)

## 2015-04-15 LAB — VITAMIN D 25 HYDROXY (VIT D DEFICIENCY, FRACTURES): VITD: 10.4 ng/mL — ABNORMAL LOW (ref 30.00–100.00)

## 2015-04-15 NOTE — Progress Notes (Signed)
Patient ID: [name], female   DOB: 1970/09/27, 45 y.o.   MRN: [id number]   Reason for Appointment: follow-up of diabetes  History of Present Illness   Diagnosis: Type 1 DIABETES MELITUS  Previous history: She has had long-standing poorly controlled diabetes and typically has poor compliance with self care measures despite periodic diabetes education and periodic followup  INSULIN PUMP regimen:   BASAL = Midnight = 0.3., 2 PM = 0.5, 5 PM = 0.6  Carb coverage 1: 18 g and correction 1: 60 with target 150, active insulin 4 hours         Recent history:   On her last visit in 11/16 her basal rate at midnight was increased slightly and decreased between 1 PM-2 PM Also her correction factor was changed to 1:60 to improve her control A1c still about the same at 9.1   She has had variable blood sugars at all different times again   Current blood sugar patterns, difficulties with management and problems identified:  She is checking her blood sugars on an average about 2-3 times a day  She is checking her blood sugars during the night that times especially when she is symptomatic with high blood sugars  No consistent pattern is seen  Blood sugars are sporadically  Blood sugars are in the target range at all different times including overnight  Blood sugar was normal last night because she did not eat all day otherwise may have higher readings in the evenings usually  HYPERGLYCEMIA is still probably related to her eating unbalanced meals, sweet drinks and not bolusing when she is eating or drinking  She will occasionally have blood sugars over 400 and sometimes may be related to changing her infusion set after about a week  She does not check her blood sugar as a follow-up after doing a correction bolus for significantly high readings and not clear if her correction factor is adequate; however she thinks her target blood sugar needs to be lower  She is entering  her carbohydrates for a meal generally about once a day but at least on 3 days recently not at all  No hypoglycemia is documented except for a reading of 69 at 6 AM  On average she is bolusing 2.3 times a day, somewhat better than before  Average glucose recently at home is  238 about the same as before  Mean values apply above for all meters except median for One Touch  PRE-MEAL  overnight   2-4 PM   8-9 PM  Bedtime Overall  Glucose range: 69-400+   116-400+   114-370     Mean/median:      238+/-120     Meals: eating at variable times and also having snacks periodically.  Her daily carbohydrate intake is quite variable Physical activity: exercise: none         Dietician visit: Most recent:?          Complications: are: Neuropathy, nephropathy, diabetic foot ulcer      Wt Readings from Last 3 Encounters:  04/15/15 125 lb (56.7 kg)  02/28/15 117 lb 12.8 oz (53.434 kg)  01/26/15 117 lb 6.4 oz (53.252 kg)   Lab Results  Component Value Date   HGBA1C 9.1* 04/15/2015   HGBA1C 9.3 01/26/2015   HGBA1C 8.6* 10/14/2014   Lab Results  Component Value Date   MICROALBUR 70.9* 01/26/2015   LDLCALC 42 02/18/2015   CREATININE 1.46* 04/15/2015  Medication List       This list is accurate as of: 04/15/15 11:59 PM.  Always use your most recent med list.               albuterol 108 (90 Base) MCG/ACT inhaler  Commonly known as:  PROVENTIL HFA;VENTOLIN HFA  Inhale 2 puffs into the lungs every 6 (six) hours as needed. Reported on 04/15/2015     amoxicillin 250 MG capsule  Commonly known as:  AMOXIL  Take 250 mg by mouth 3 (three) times daily. Reported on 04/15/2015     calcitRIOL 0.25 MCG capsule  Commonly known as:  ROCALTROL  Take 0.25 mcg by mouth every Monday, Wednesday, and Friday. Reported on 04/15/2015     [facility] CALCIUM 600 PLUS Chew  Chew 1,200 mg by mouth daily. Reported on 04/15/2015     ferrous sulfate 325 (65 FE) MG tablet  Take 325 mg by mouth 3 (three) times  daily with meals. Reported on [id number]     folic acid 1 MG tablet  Commonly known as:  FOLVITE  Take 1 mg by mouth daily. Reported on 04/15/2015     furosemide 20 MG tablet  Commonly known as:  LASIX  Take 20 mg by mouth daily as needed for fluid. Reported on 04/15/2015     gabapentin 100 MG capsule  Commonly known as:  NEURONTIN  Take 1 capsule (100 mg total) by mouth 3 (three) times daily.     insulin pump Soln  Inject into the skin. Reported on 04/15/2015     lactobacillus acidophilus Tabs tablet  Take 1 tablet by mouth 3 (three) times daily. Reported on 04/15/2015     NOVOLOG 100 UNIT/ML injection  Generic drug:  insulin aspart  USE MAX 65 UNITS PER DAY WITH PUMP     pravastatin 40 MG tablet  Commonly known as:  PRAVACHOL  TAKE 1/2 TABLETS (20 MG TOTAL) BY MOUTH DAILY.     pyridoxine 100 MG tablet  Commonly known as:  B-6  Take 100 mg by mouth daily. Reported on 04/15/2015     Rivaroxaban 15 MG Tabs tablet  Commonly known as:  XARELTO  Take 1 tablet (15 mg total) by mouth 2 (two) times daily with a meal.     sodium bicarbonate 650 MG tablet  Take 650 mg by mouth 3 (three) times daily. Reported on 04/15/2015     vitamin B-12 100 MCG tablet  Commonly known as:  CYANOCOBALAMIN  Take 200 mcg by mouth daily. Reported on 04/15/2015        Allergies:  Allergies  Allergen Reactions  . Lisinopril Other (See Comments)    Elevated potassium per pt report  . Ciprofloxacin Itching  . Cleocin [Clindamycin Hcl]     Diarrhea     Past Medical History  Diagnosis Date  . Diabetes mellitus   . Neuropathy in diabetes ([place])   . Hypertension   . Hypercholesteremia   . H/O seasonal allergies   . Anemia   . Left foot infection     Past Surgical History  Procedure Laterality Date  . Cesarean section    . Eye surgery    . Hemrrhoidectomy    . Peripherally inserted central catheter insertion    . I&d extremity Right 06/10/2014    Procedure: IRRIGATION AND DEBRIDEMENT Right  Foot;  Surgeon: Newt Minion, MD;  Location: [facility];  Service: Orthopedics;  Laterality: Right;  . Skin split graft Right 06/15/2014  Procedure: SPLIT THICKNESS SKIN GRAFT RIGHT FOOT;  Surgeon: Newt Minion, MD;  Location: [facility];  Service: Orthopedics;  Laterality: Right;    Family History  Problem Relation Age of Onset  . Hyperlipidemia Mother     Social History:  reports that she quit smoking about 6 years ago. She has never used smokeless tobacco. She reports that she drinks about 0.5 oz of alcohol per week. She reports that she does not use illicit drugs.  Review of Systems:  Hypertension/CKD:   Her last creatinine is as follows: Overall stable Blood pressure is quite normal without any medications  Lab Results  Component Value Date   CREATININE 1.46* 04/15/2015   BUN 36* 04/15/2015   NA 133* 04/15/2015   K 4.8 04/15/2015   CL 108 04/15/2015   CO2 17* 04/15/2015     Lipids: Has been  prescribed pravastatin lipids are much improved  Lab Results  Component Value Date   CHOL 93 02/18/2015   HDL 16.10* 02/18/2015   LDLCALC 42 02/18/2015   LDLDIRECT 112.0 07/05/2014   TRIG 172.0* 02/18/2015   CHOLHDL 6 02/18/2015     She has been treated by nephrologist for anemia and has required iron infusion but has not gone for follow-up   Lab Results  Component Value Date   WBC 4.8 10/17/2014   HGB 10.7* 10/17/2014   HCT 33.8* 10/17/2014   MCV 89.7 10/17/2014   PLT 386 10/17/2014   She has a Charcot foot  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     ASSESSMENT/ PLAN:   Diabetes type 1 with poor control and multiple complications    Her diabetes is still poorly controlled  See history of present illness  for details of her blood sugar patterns, current management and problems identified. She has done a little better with glucose monitoring and trying to take boluses high readings and sometimes 4 carbohydrate intake However because of her variability in blood sugar consistent pattern is identified for making changes in her settings Currently not getting any hypoglycemia and she is somewhat more comfortable trying to address her hyperglycemia better  She wants to go on a vegan diet but discussed that she may not get enough protein and balanced meals and needs guidance other than using nonconventional providers   DIABETIC nephropathy with nephrotic syndrome: Recommend follow-up with nephrologist  Lipids are better controlled with starting pravastatin and will continue   PLAN:    Reduce blood sugar target 120  Also she needs to be consistent with eating balanced meals and snacks and bolusing for all carbohydrate intake as well as avoiding drinks with sugar.  Currently not bolusing at the time of carbohydrate intake and systole  This is usually causing her hyperglycemia  She also needs to change her infusion sets consistently  Consultation with dietitian to be set up  She will make sure and check blood sugars 2 hours after doing correction for high reading  Check vitamin D level because of her renal dysfunction and poor nutritional status  Counseling time on subjects discussed above is over 50% of today's 25 minute visit  There are no Patient Instructions on  file for this visit.   Ravon Mcilhenny 04/16/2015, 2:35 PM    addendum: Vitamin D level is 10.4, start 50,000 units weekly

## 2015-04-15 NOTE — Progress Notes (Signed)
Pre visit review using our clinic review tool, if applicable. No additional management support is needed unless otherwise documented below in the visit note. 

## 2015-04-16 LAB — FRUCTOSAMINE: FRUCTOSAMINE: 289 umol/L — AB (ref 0–285)

## 2015-04-16 NOTE — Addendum Note (Signed)
Addended by: Elayne Snare on: 04/16/2015 02:54 PM   Modules accepted: Orders

## 2015-04-16 NOTE — Progress Notes (Signed)
Quick Note:  Please let patient know that the vitamin D level is extremely low, needs to be on 50,000 units weekly by prescription ______

## 2015-04-18 ENCOUNTER — Telehealth: Payer: Self-pay | Admitting: Endocrinology

## 2015-04-18 ENCOUNTER — Other Ambulatory Visit: Payer: Self-pay | Admitting: *Deleted

## 2015-04-18 MED ORDER — FERROUS SULFATE 325 (65 FE) MG PO TABS: 325.0000 mg | ORAL_TABLET | Freq: Three times a day (TID) | ORAL | Status: DC

## 2015-04-18 MED ORDER — GABAPENTIN 100 MG PO CAPS: 100.0000 mg | ORAL_CAPSULE | Freq: Three times a day (TID) | ORAL | Status: DC

## 2015-04-18 NOTE — Telephone Encounter (Signed)
Patient Name: [name] Gender: Female DOB: Dec 18, 1970 Age: 46 Y 8 M 28 D Return Phone Number: [phone] (Primary) Address: City/State/Zip: [place] [address] Client [facility] Night - Client Client Site [facility] Physician [name], [place] Type Call Caller Name Bryah Liffick Phone Number [phone] Relationship To Patient Self Is this call to report lab results? No Call Type General Information Initial Comment Caller states she needs her prescription re-done General Information Type Other

## 2015-04-18 NOTE — Telephone Encounter (Signed)
Patient need refill  Of Gabapentin  And iron medication (ferrous sulfate) send to  [id number] [address], [place] [phone] (Phone) [phone] (Fax)

## 2015-04-18 NOTE — Telephone Encounter (Signed)
Rx sent 

## 2015-04-19 ENCOUNTER — Other Ambulatory Visit: Payer: Self-pay | Admitting: *Deleted

## 2015-04-19 MED ORDER — VITAMIN D (ERGOCALCIFEROL) 1.25 MG (50000 UNIT) PO CAPS: 50000.0000 [IU] | ORAL_CAPSULE | ORAL | Status: DC

## 2015-04-22 DIAGNOSIS — I739 Peripheral vascular disease, unspecified: Secondary | ICD-10-CM | POA: Insufficient documentation

## 2015-04-27 ENCOUNTER — Encounter (HOSPITAL_BASED_OUTPATIENT_CLINIC_OR_DEPARTMENT_OTHER): Payer: Medicare Other | Attending: Internal Medicine

## 2015-04-27 DIAGNOSIS — L97529 Non-pressure chronic ulcer of other part of left foot with unspecified severity: Secondary | ICD-10-CM | POA: Insufficient documentation

## 2015-04-27 DIAGNOSIS — Z86718 Personal history of other venous thrombosis and embolism: Secondary | ICD-10-CM | POA: Insufficient documentation

## 2015-04-27 DIAGNOSIS — G4730 Sleep apnea, unspecified: Secondary | ICD-10-CM | POA: Insufficient documentation

## 2015-04-27 DIAGNOSIS — E1051 Type 1 diabetes mellitus with diabetic peripheral angiopathy without gangrene: Secondary | ICD-10-CM | POA: Diagnosis not present

## 2015-04-27 DIAGNOSIS — E10621 Type 1 diabetes mellitus with foot ulcer: Secondary | ICD-10-CM | POA: Diagnosis present

## 2015-04-27 DIAGNOSIS — L97521 Non-pressure chronic ulcer of other part of left foot limited to breakdown of skin: Secondary | ICD-10-CM | POA: Insufficient documentation

## 2015-04-27 DIAGNOSIS — E10610 Type 1 diabetes mellitus with diabetic neuropathic arthropathy: Secondary | ICD-10-CM | POA: Diagnosis not present

## 2015-04-27 DIAGNOSIS — M1990 Unspecified osteoarthritis, unspecified site: Secondary | ICD-10-CM | POA: Diagnosis not present

## 2015-04-27 DIAGNOSIS — M86372 Chronic multifocal osteomyelitis, left ankle and foot: Secondary | ICD-10-CM | POA: Insufficient documentation

## 2015-04-27 DIAGNOSIS — Z89431 Acquired absence of right foot: Secondary | ICD-10-CM | POA: Diagnosis not present

## 2015-04-27 DIAGNOSIS — G40909 Epilepsy, unspecified, not intractable, without status epilepticus: Secondary | ICD-10-CM | POA: Diagnosis not present

## 2015-04-27 DIAGNOSIS — D649 Anemia, unspecified: Secondary | ICD-10-CM | POA: Insufficient documentation

## 2015-05-03 HISTORY — PX: FEMORAL-POPLITEAL BYPASS GRAFT: SHX937

## 2015-05-25 ENCOUNTER — Telehealth: Payer: Self-pay | Admitting: Endocrinology

## 2015-05-25 NOTE — Telephone Encounter (Signed)
Pt said that she needs to be back on her BP medications because it is extremely high and she is afraid of what it will do to her.

## 2015-05-25 NOTE — Telephone Encounter (Signed)
Need to have office visit to evaluate her blood pressure. She does not have a follow-up appointment.  If need be she can see her PCP for quicker appointment

## 2015-05-25 NOTE — Telephone Encounter (Signed)
See note below and please advise, Thanks! 

## 2015-05-26 NOTE — Telephone Encounter (Signed)
Left message for patient to return phone call.  

## 2015-05-31 DIAGNOSIS — N184 Chronic kidney disease, stage 4 (severe): Secondary | ICD-10-CM

## 2015-05-31 DIAGNOSIS — N179 Acute kidney failure, unspecified: Secondary | ICD-10-CM | POA: Insufficient documentation

## 2015-06-01 HISTORY — PX: SKIN GRAFT: SHX250

## 2015-06-03 IMAGING — US US RENAL
1 series · 14 of 25 positions shown · non-contrast
Comparison: None.

CLINICAL DATA: Chronic kidney disease, stage III

EXAM:
RENAL/URINARY TRACT ULTRASOUND COMPLETE

[Series 1: us renal · 0.26mm/px · 14 of 42 slices shown]
[im 1/42]
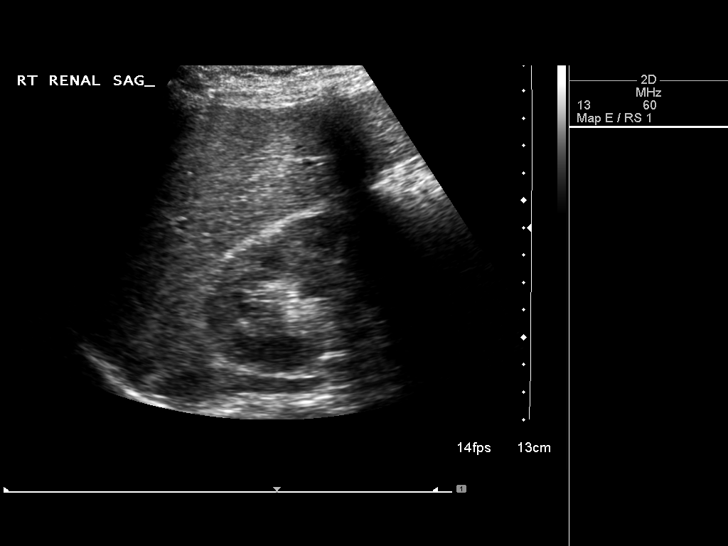
[im 4/42]
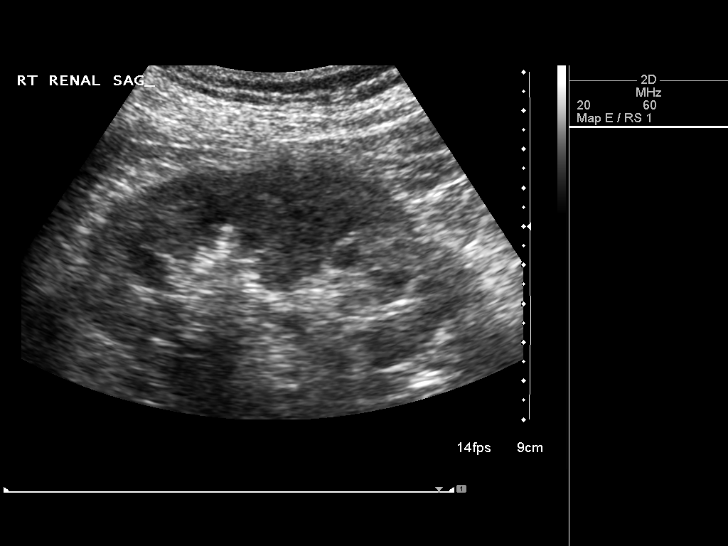
[im 7/42]
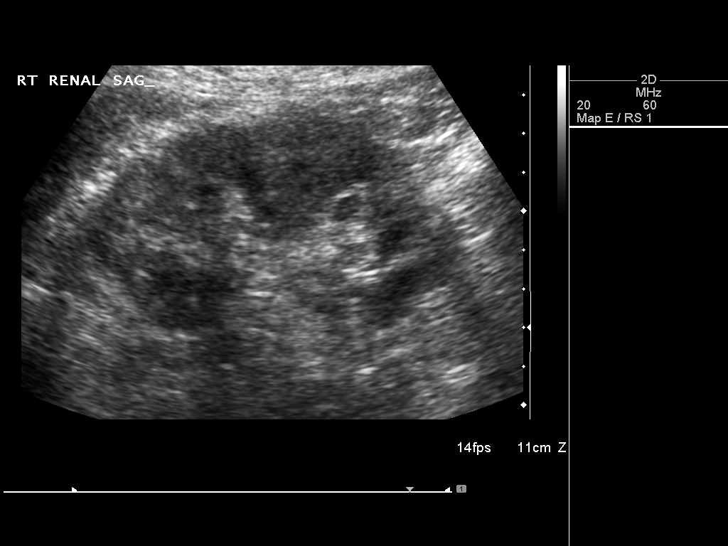
[im 11/42]
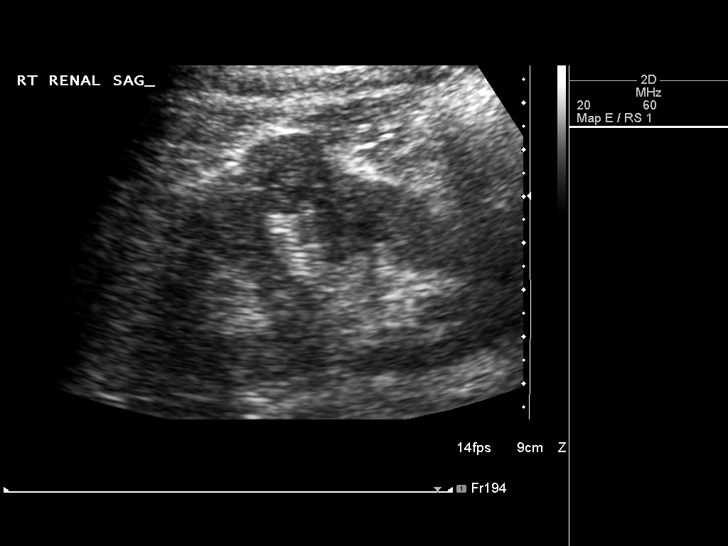
[im 14/42]
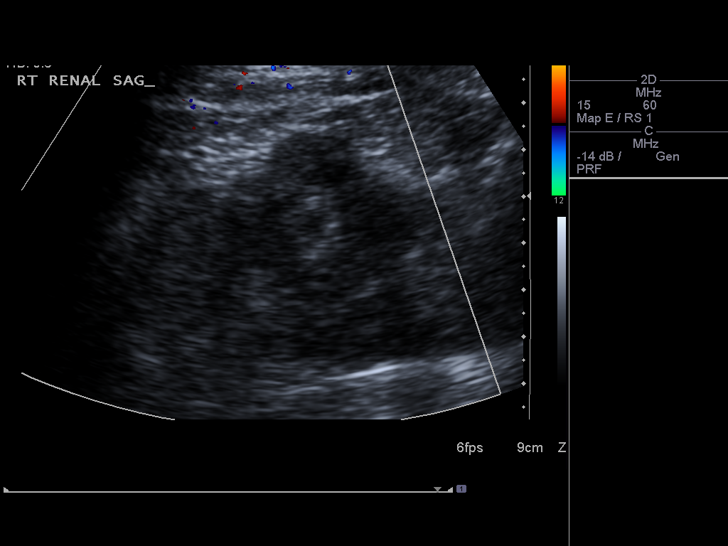
[im 16/42]
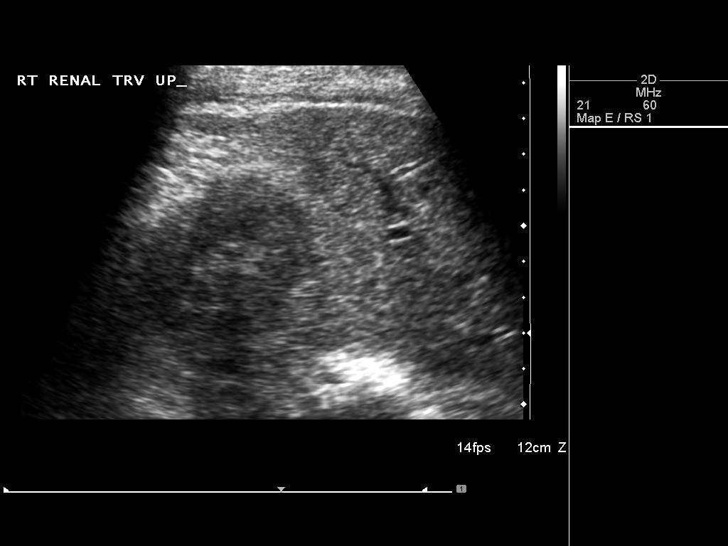
[im 19/42]
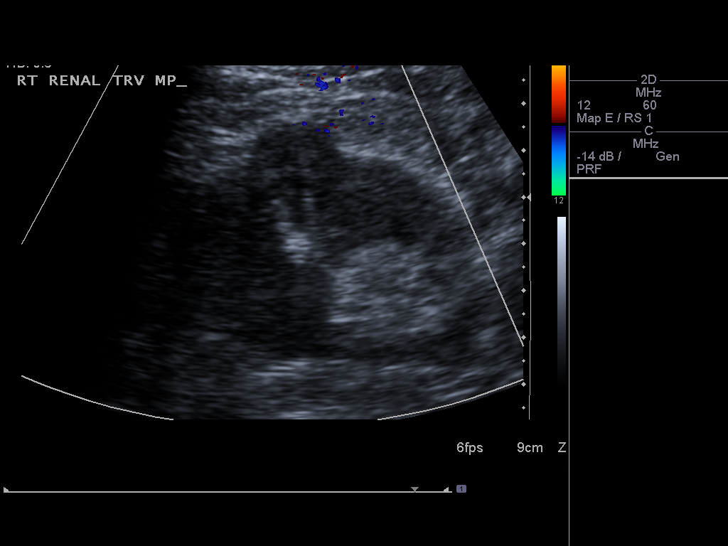
[im 23/42]
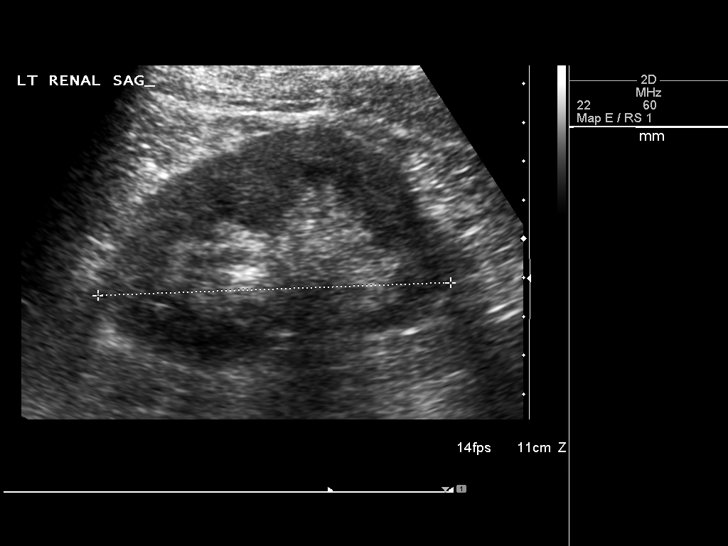
[im 26/42]
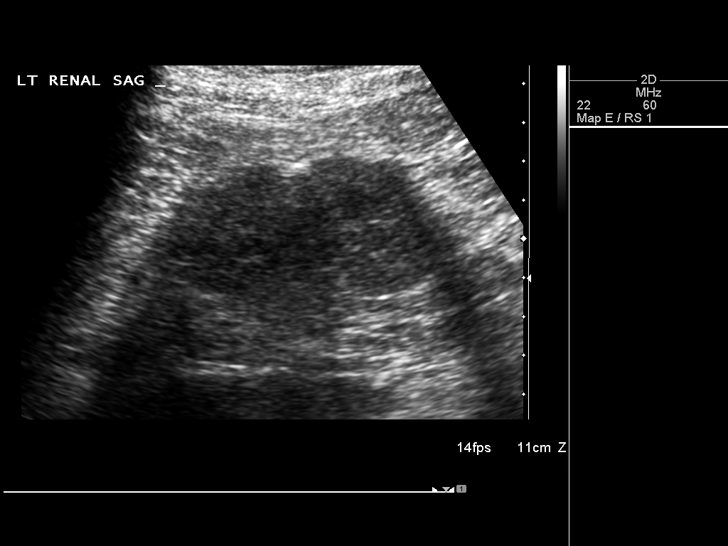
[im 28/42]
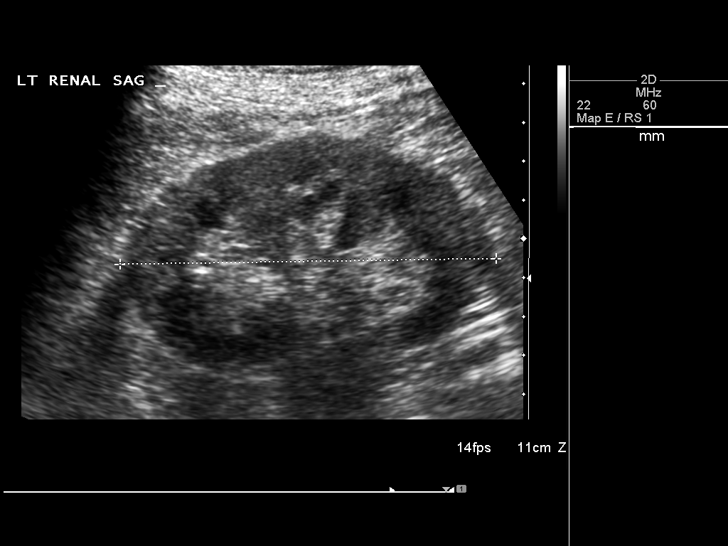
[im 31/42]
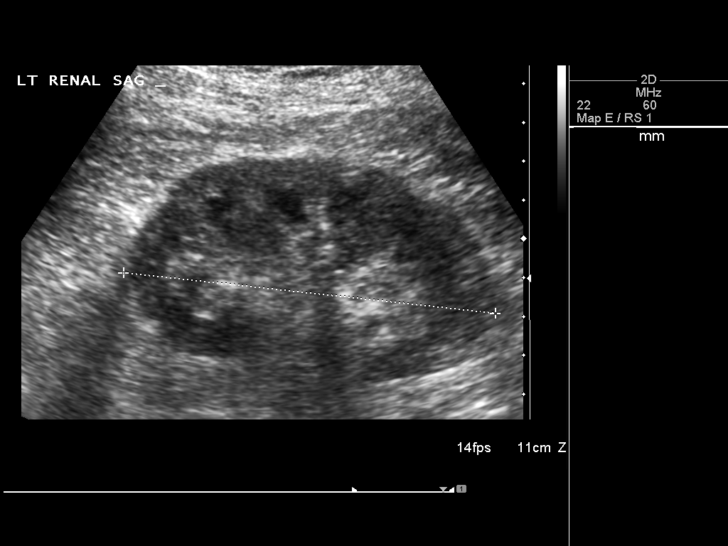
[im 35/42]
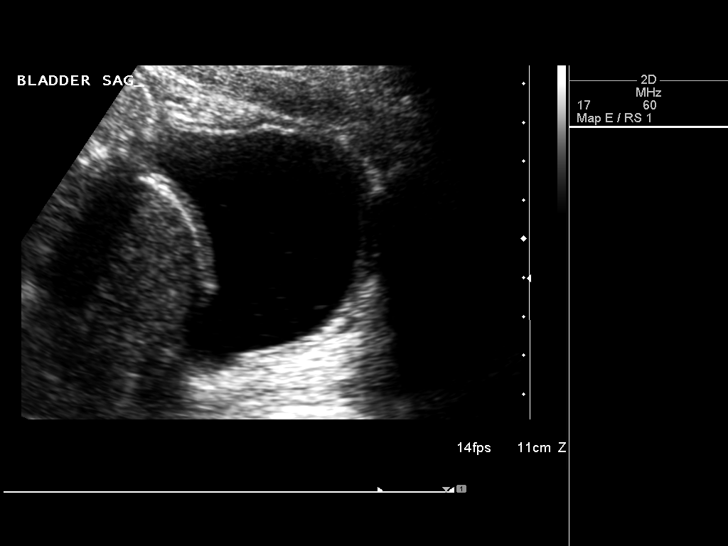
[im 38/42]
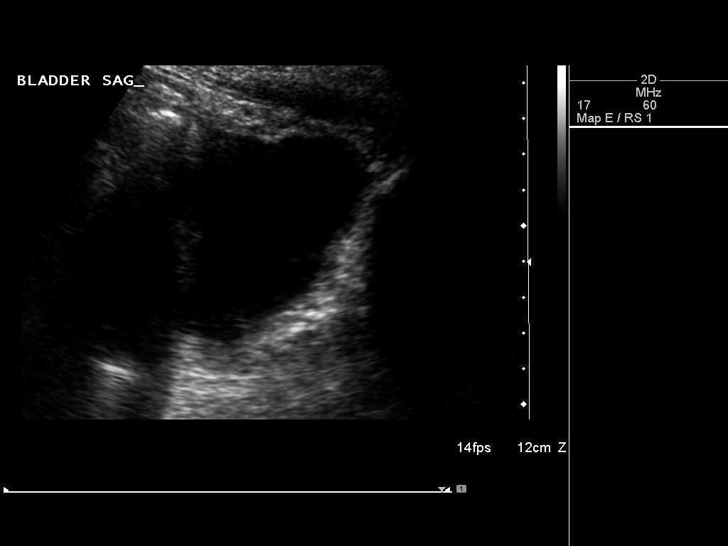
[im 42/42]
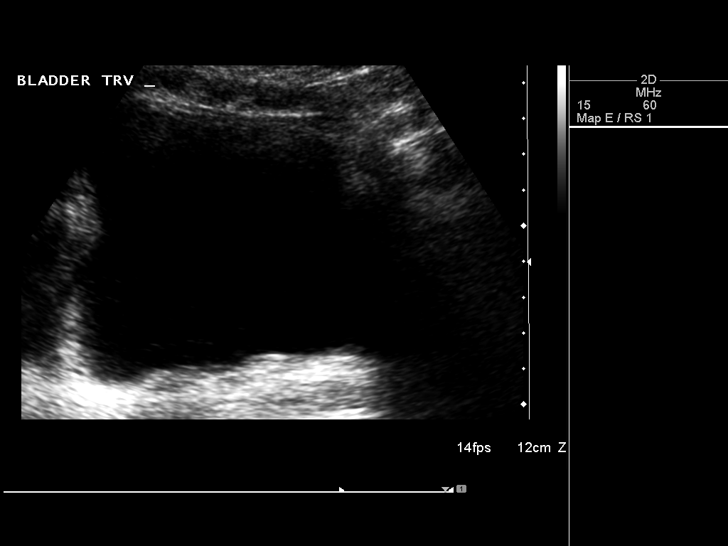

[14 of 25 positions shown; findings below may reference images not displayed]

FINDINGS: Right Kidney:

Length: 10.2 cm. Echogenicity within normal limits. There is a 2 x
1.9 x 1.9 cm right midpole hypoechoic masslike area which is
isoechoic to the remainder of the renal cortex but, more
conspicuous. No hydronephrosis visualized.

Left Kidney:

Length: 9.7 cm. Echogenicity within normal limits. No mass or
hydronephrosis visualized.

Bladder:

Appears normal for degree of bladder distention.
IMPRESSION: 1. No obstructive uropathy.
2. There is a 2 x 1.9 x 1.9 cm right midpole hypoechoic masslike
area which is isoechoic to the remainder of the renal cortex but,
more conspicuous. This may reflect a normal renal lobulation, but a
soft tissue would have a similar appearance. Further
characterization with CT or MRI is recommended.

## 2015-06-29 IMAGING — CT CT ABD-PELV W/O CM
2 of 4 series · 12 of 36 positions shown, 18 images · IV contrast (READICAT)
Comparison: Renal ultrasound, 01/06/2014.  CT, 05/25/2005.

CLINICAL DATA: Possible renal mass seen on ultrasound. Contrast
withheld due to renal dysfunction. No patient complaints. No history
of carcinoma.

EXAM:
CT ABDOMEN AND PELVIS WITHOUT CONTRAST
TECHNIQUE: Multidetector CT imaging of the abdomen and pelvis was performed
following the standard protocol without IV contrast.

[Series 3: abd/pelvis w/o · axial · non-contrast · 0.64mm/px · z∈[-433,-6]mm · 11 of 195 slices shown, 16 images]
[im 12/195  soft-tissue]
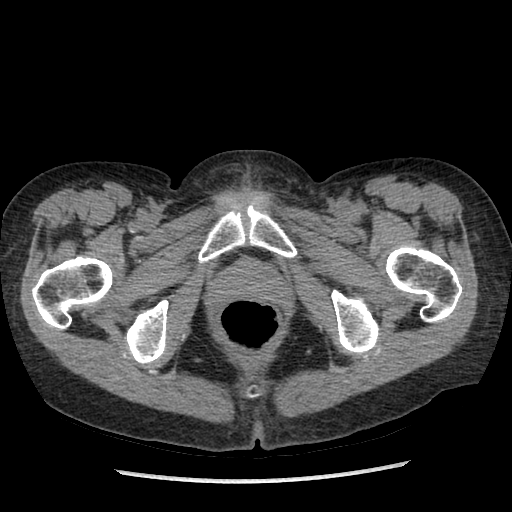
[im 12/195  bone]
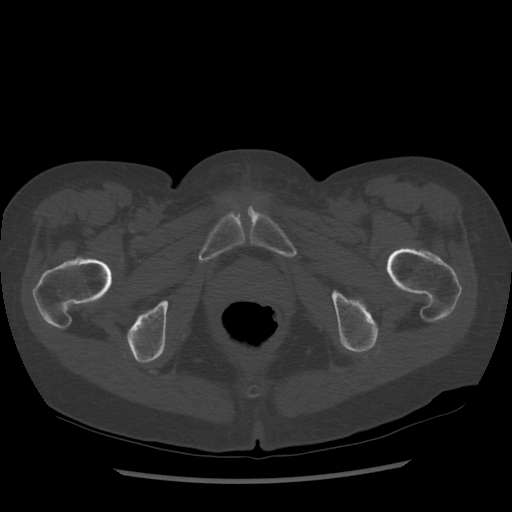
[im 35/195  soft-tissue]
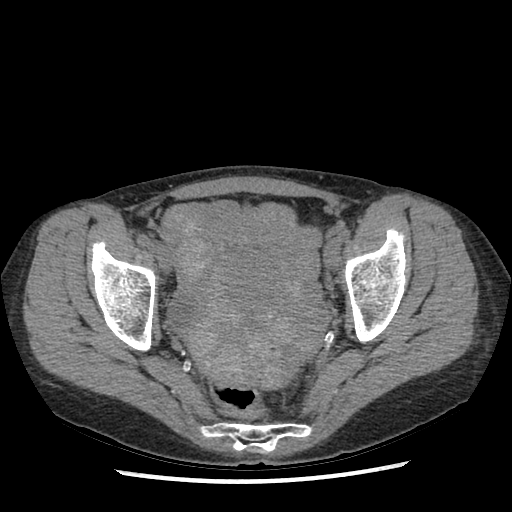
[im 58/195  soft-tissue]
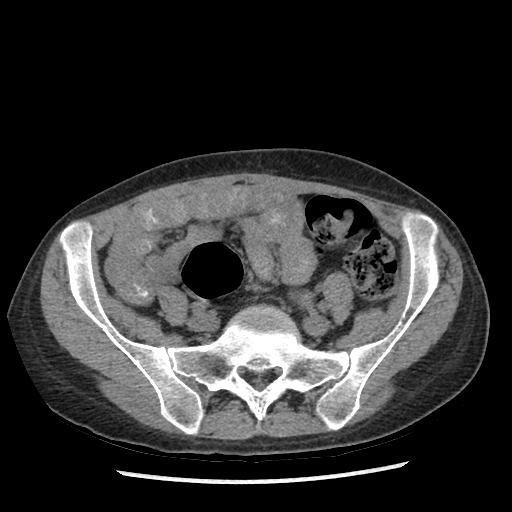
[im 69/195  soft-tissue]
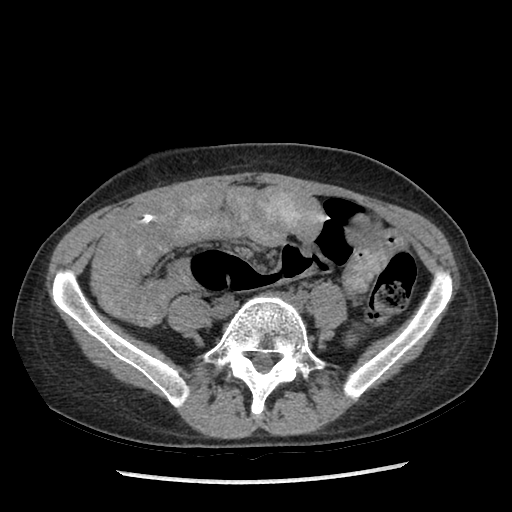
[im 92/195  soft-tissue]
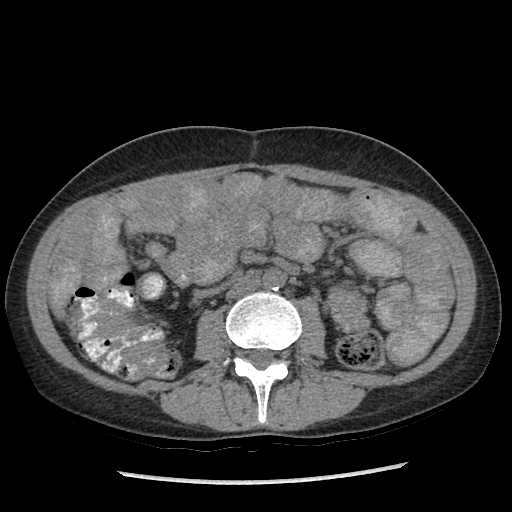
[im 103/195  soft-tissue]
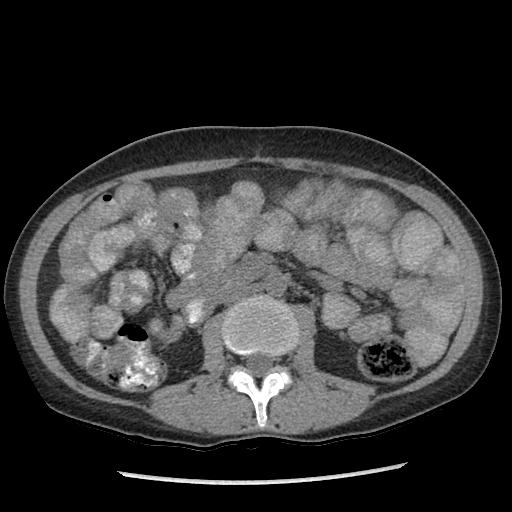
[im 126/195  soft-tissue]
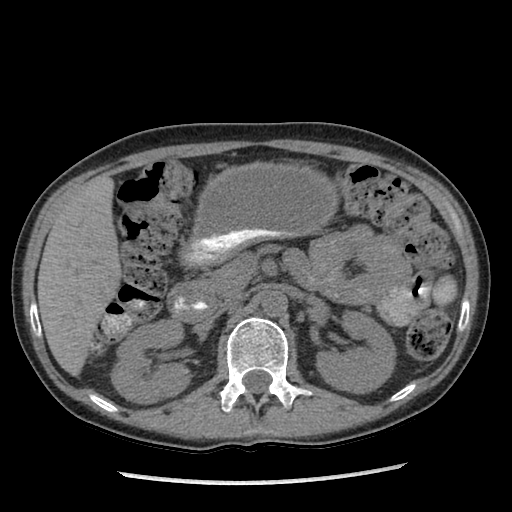
[im 149/195  soft-tissue]
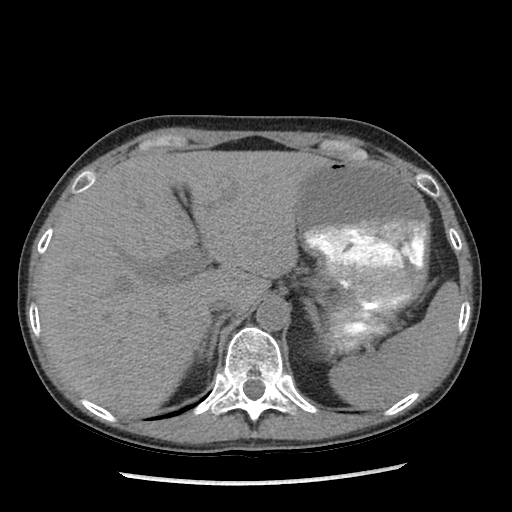
[im 149/195  lung]
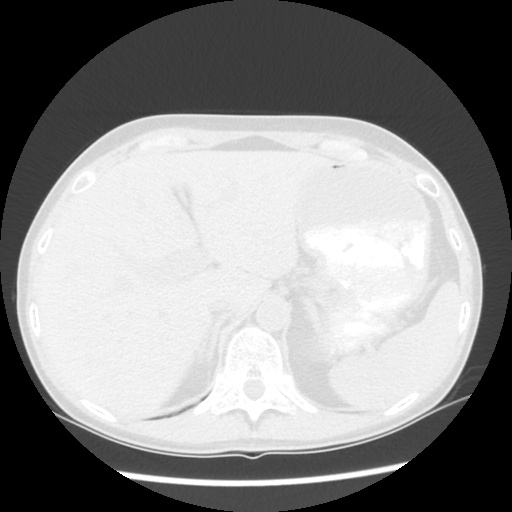
[im 160/195  soft-tissue]
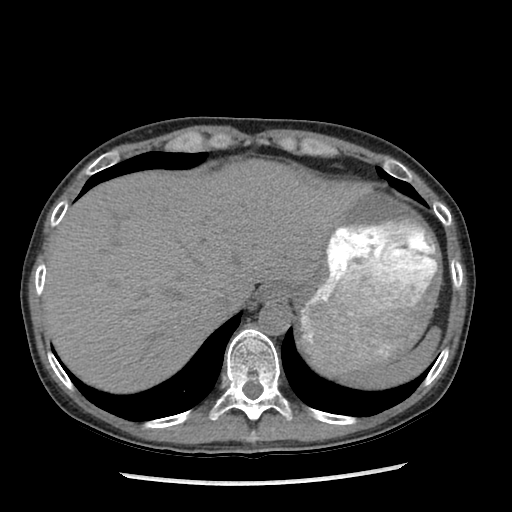
[im 160/195  lung]
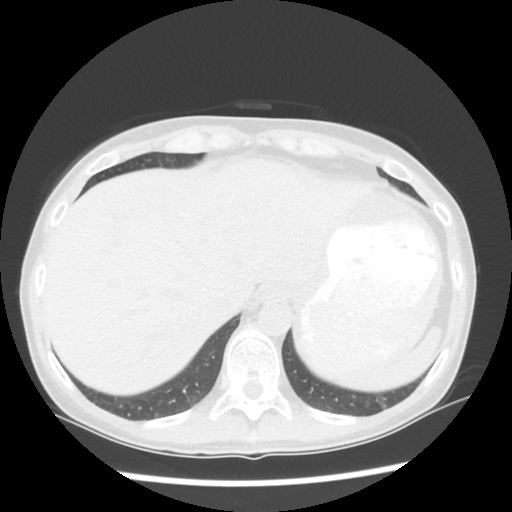
[im 160/195  bone]
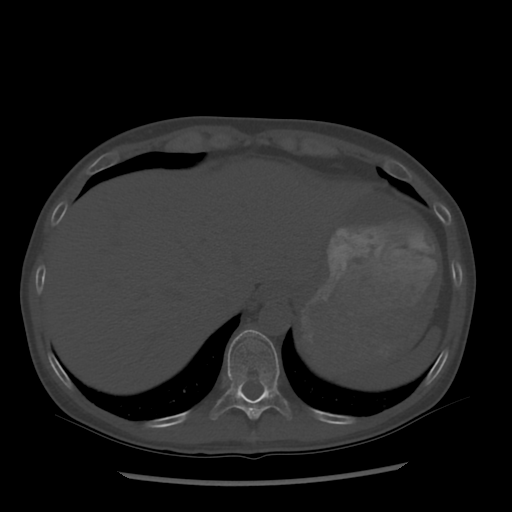
[im 172/195  lung]
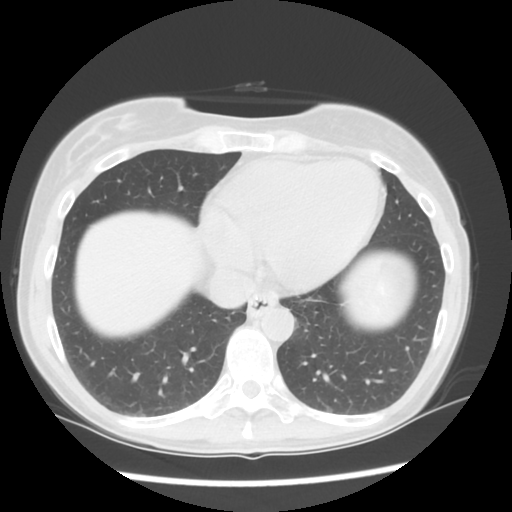
[im 183/195  soft-tissue]
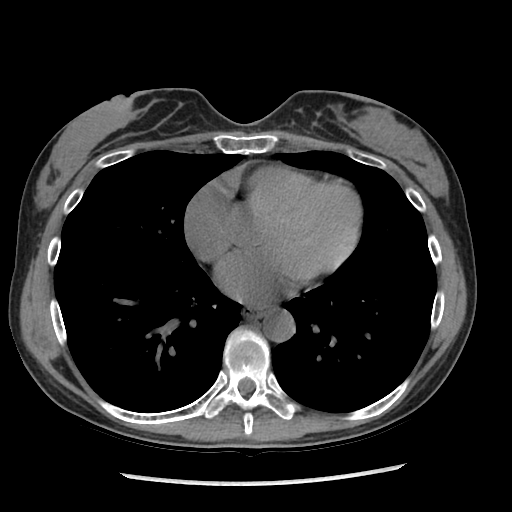
[im 183/195  lung]
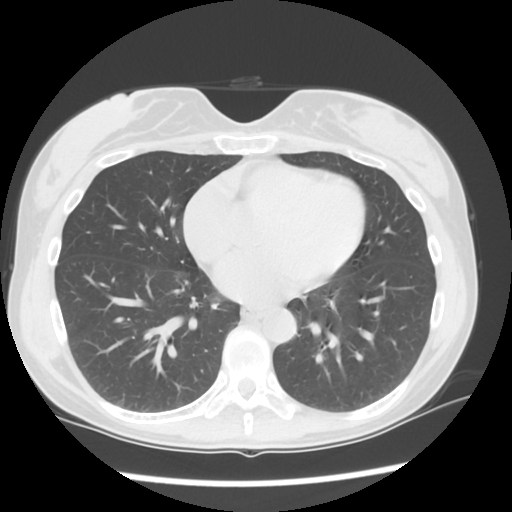

[Series 601: coronal body · coronal · 0.95mm/px · 1 of 100 slices shown, 2 images]
[im 34/100  soft-tissue]
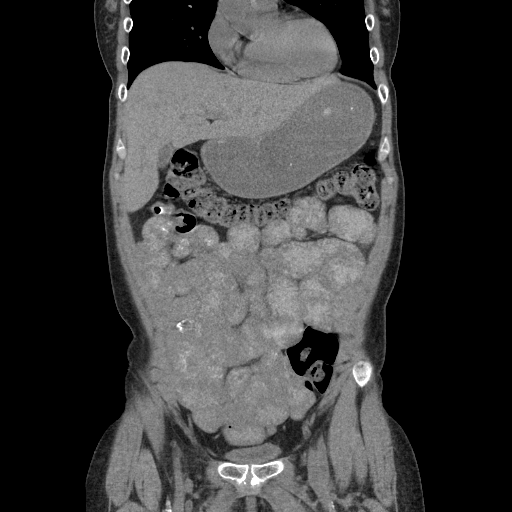
[im 34/100  bone]
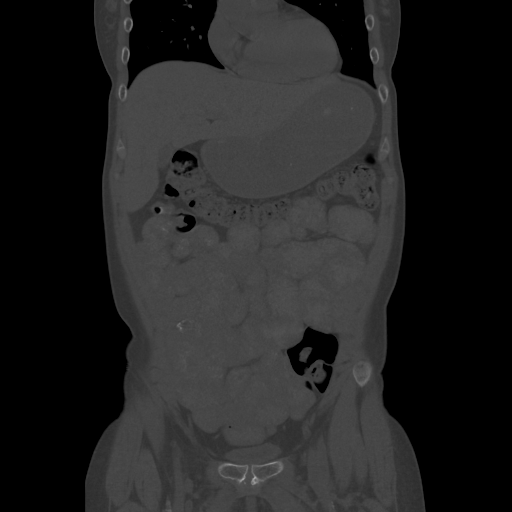

[12 of 36 positions shown; findings below may reference images not displayed]

FINDINGS: There is an indentation along the contour of the lateral midpole the
right kidney with the parenchyma posterior to this appearing
prominent. This has the appearance of scarring, and the combination
of this with the prominent adjacent parenchyma accounts for the
questionable mass noted on ultrasound. There is no CT evidence of a
mass, although this evaluation is somewhat limited by lack of
intravenous contrast.

2 mm nonobstructing stone lies along the midpole of the left kidney.
No other intrarenal stones. No convincing renal masses and no
hydronephrosis. Normal ureters. Bladder is unremarkable.

Clear lung bases. Heart is normal in size. Liver is unremarkable.
There are several splenic calcifications consistent with healed
granuloma. Spleen otherwise unremarkable. Normal gallbladder and
pancreas. No bile duct dilation. No adrenal masses.

Uterus and adnexa are unremarkable.

There are prominent mesenteric lymph nodes seen diffusely. Although
none are pathologically enlarged by size criteria, the overall
number these as well as the prominence is unusual. There is also
mild inguinal adenopathy, the largest a left inguinal node measuring
13 mm in short axis. These nodes have increased in size and number
when compared to the prior CT.

Mild increased stool is noted throughout the colon. Colon is
otherwise unremarkable. Small bowel is unremarkable. No ascites.

Mild loss of disc height at L4-L5.  No other bony abnormality.
IMPRESSION: 1. No evidence of a renal mass on this unenhanced study. The
apparent mass seen sonographically that appears to be normal
parenchyma accentuated by an area of scarring. Scarring is likely
from the pyelonephritis noted throughout much of the right kidney on
the prior CT scan in 0883.
2. Prominent mesenteric lymph nodes, none pathologically enlarged by
size criteria. There are also prominent inguinal lymph nodes with 1
left inguinal node being mildly enlarged. Nodes are more prominent
and numerous than on the prior CT. These may all be reactive.
Lymphoproliferative disorder, however, should be considered.
3. Small nonobstructing stone in the left kidney. Mild disc
degenerative changes at L4-L5. Mild increased stool noted throughout
colon.
4. No acute findings.

## 2015-07-18 ENCOUNTER — Other Ambulatory Visit: Payer: Self-pay | Admitting: *Deleted

## 2015-07-18 MED ORDER — VITAMIN D (ERGOCALCIFEROL) 1.25 MG (50000 UNIT) PO CAPS: 50000.0000 [IU] | ORAL_CAPSULE | ORAL | Status: DC

## 2015-07-24 HISTORY — PX: AMPUTATION TOE: SHX6595

## 2015-07-24 IMAGING — MG MM DIGITAL DIAGNOSTIC BILAT
5 series · 5 of 5 positions shown · non-contrast
Comparison: Prior exam, 12/09/2007

CLINICAL DATA: Right breast mass. Patient palpated this several
weeks ago but does not know if it is new.

EXAM:
DIGITAL DIAGNOSTIC BILATERAL MAMMOGRAM WITH CAD
ULTRASOUND LEFT BREAST

[R CC]
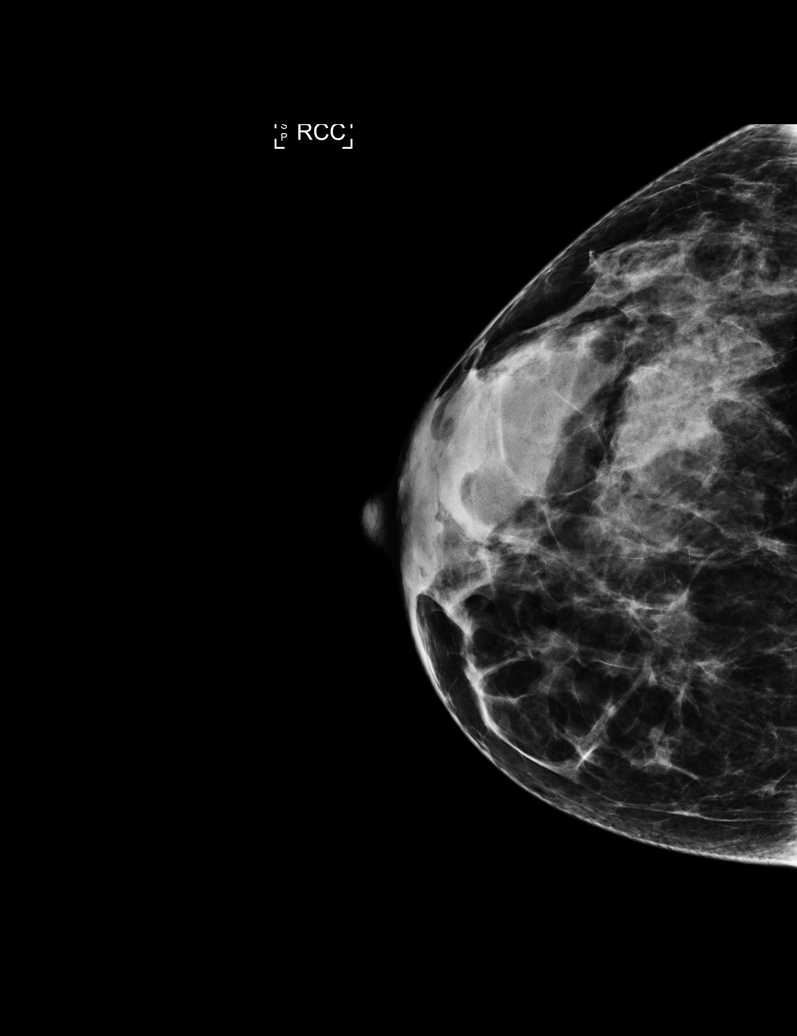

[L MLO]
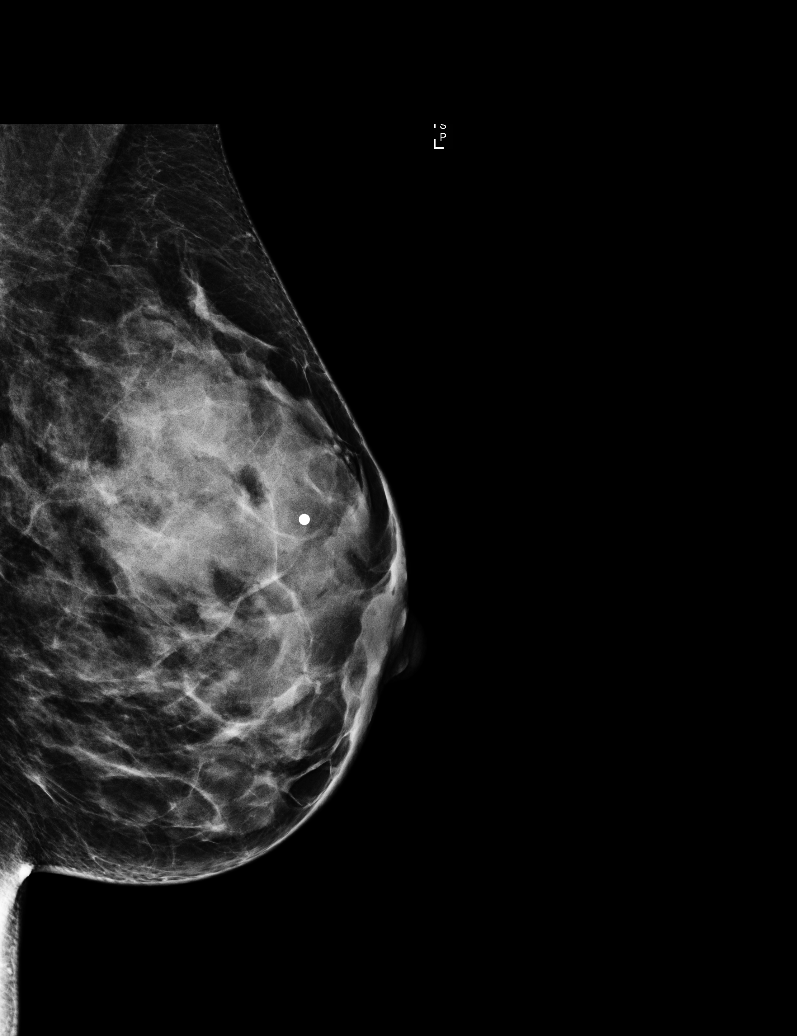

[L CC]
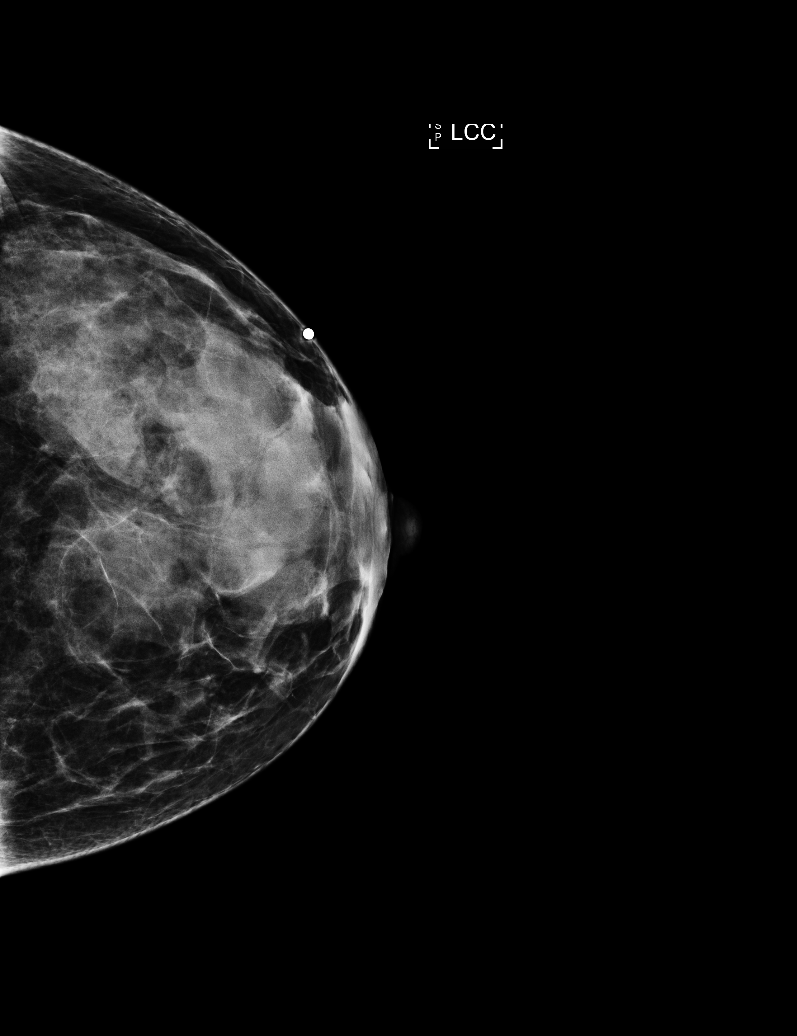

[R MLO]
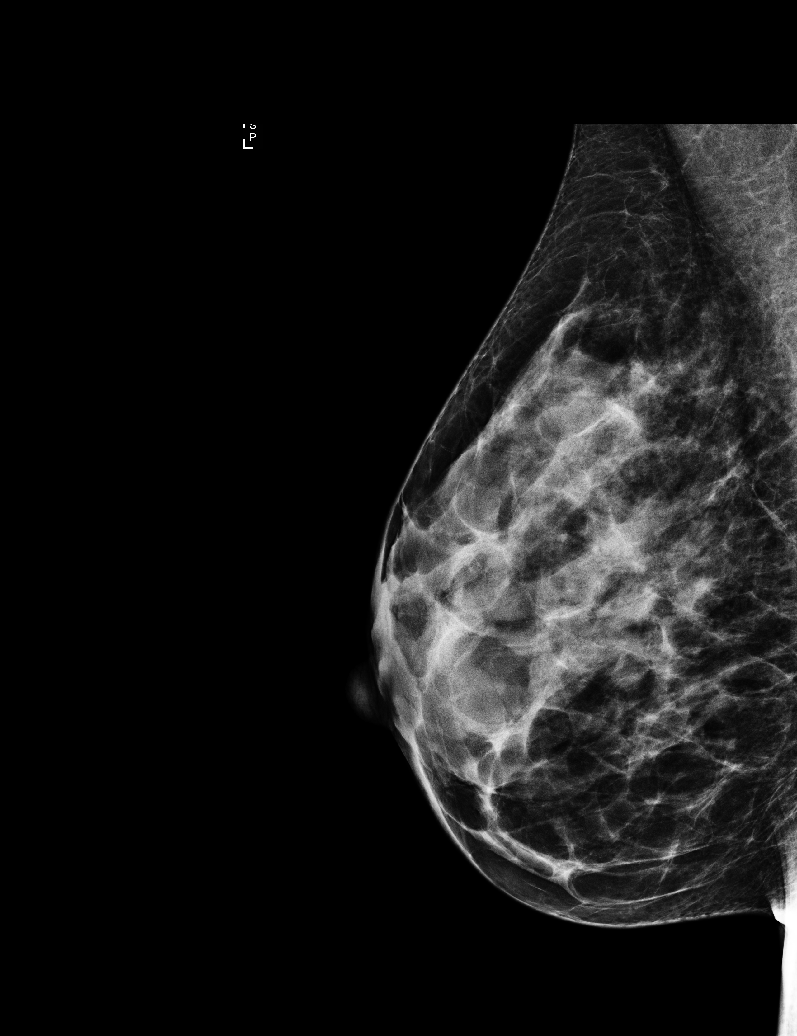

[L TAN]
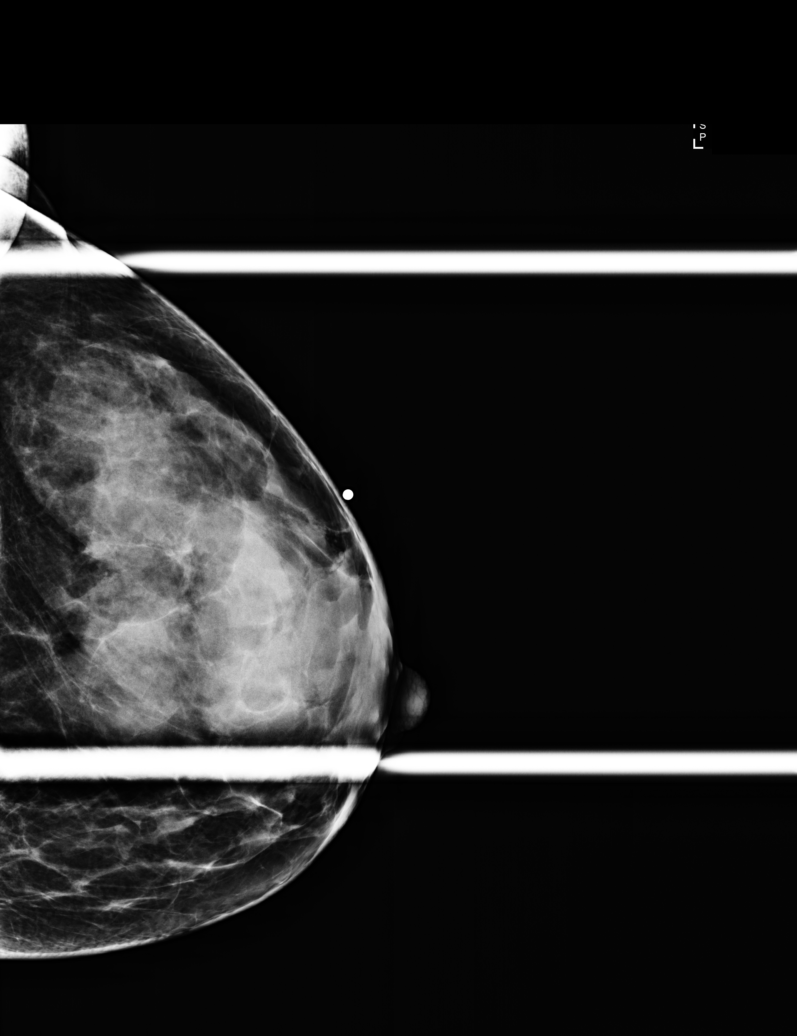

[5 of 5 positions shown; findings below may reference images not displayed]

ACR Breast Density Category c: The breast tissue is heterogeneously
dense, which may obscure small masses.
FINDINGS: There are no discrete masses, areas of architectural distortion or
suspicious calcifications. There is tissue somewhat more dense in
the upper outer left breast that on the right, which is stable from
the prior study.

Mammographic images were processed with CAD.

On physical exam, no discrete masses palpated. Patient is reporting
area of fibroglandular tissue in retroareolar and upper outer left
breast.

Targeted ultrasound is performed, showing dense fibroglandular
tissue on the 12 o'clock position of the left breast and
retroareolar left breast throughout the upper outer lateral left
breast. No discrete mass. No cyst.
IMPRESSION: Normal exam. Patient is palpated dense fibroglandular tissue
throughout much of the left breast. No evidence of malignancy.

RECOMMENDATION:
Screening mammogram in one year.(Code:RG-D-AVN)

I have discussed the findings and recommendations with the patient.
Results were also provided in writing at the conclusion of the
visit. If applicable, a reminder letter will be sent to the patient
regarding the next appointment.

BI-RADS CATEGORY  1: Negative.

## 2015-07-24 IMAGING — US US BREAST*L* LIMITED INC AXILLA
1 series · 1 of 1 positions shown · non-contrast
Comparison: Prior exam, 12/09/2007

CLINICAL DATA: Right breast mass. Patient palpated this several
weeks ago but does not know if it is new.

EXAM:
DIGITAL DIAGNOSTIC BILATERAL MAMMOGRAM WITH CAD
ULTRASOUND LEFT BREAST

[Series 1: us breast*left* limited inc axilla · 1 of 1 slices shown]
[im 1/1]
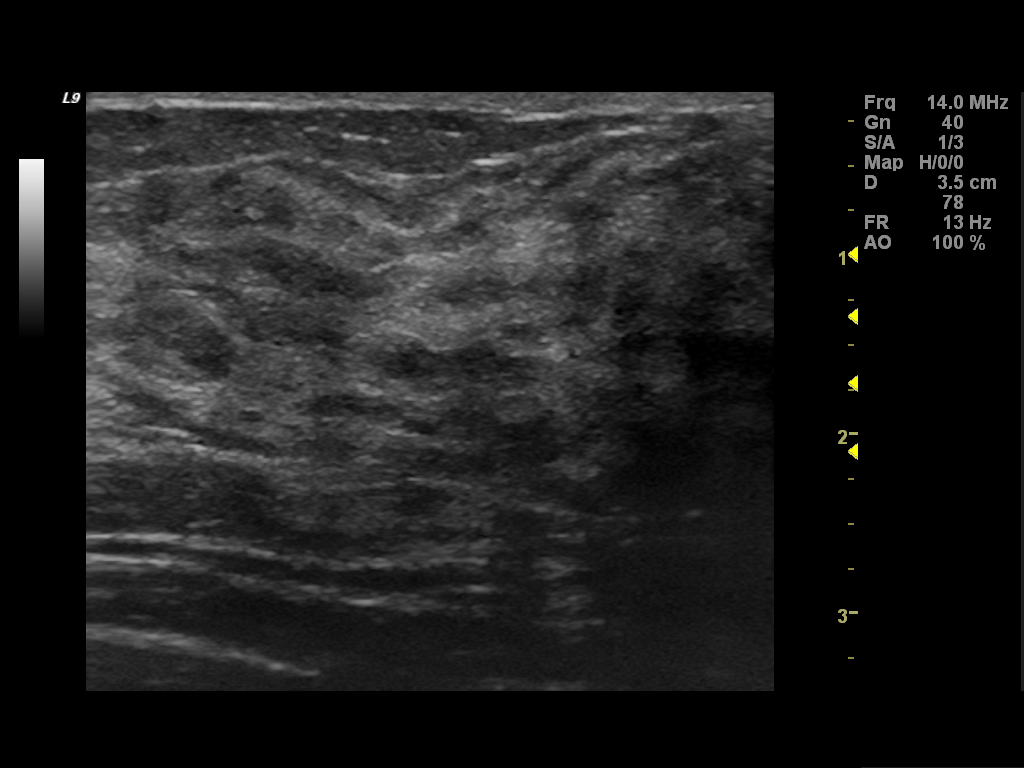

[1 of 1 positions shown; findings below may reference images not displayed]

ACR Breast Density Category c: The breast tissue is heterogeneously
dense, which may obscure small masses.
FINDINGS: There are no discrete masses, areas of architectural distortion or
suspicious calcifications. There is tissue somewhat more dense in
the upper outer left breast that on the right, which is stable from
the prior study.

Mammographic images were processed with CAD.

On physical exam, no discrete masses palpated. Patient is reporting
area of fibroglandular tissue in retroareolar and upper outer left
breast.

Targeted ultrasound is performed, showing dense fibroglandular
tissue on the 12 o'clock position of the left breast and
retroareolar left breast throughout the upper outer lateral left
breast. No discrete mass. No cyst.
IMPRESSION: Normal exam. Patient is palpated dense fibroglandular tissue
throughout much of the left breast. No evidence of malignancy.

RECOMMENDATION:
Screening mammogram in one year.(Code:RG-D-AVN)

I have discussed the findings and recommendations with the patient.
Results were also provided in writing at the conclusion of the
visit. If applicable, a reminder letter will be sent to the patient
regarding the next appointment.

BI-RADS CATEGORY  1: Negative.

## 2015-08-02 ENCOUNTER — Telehealth: Payer: Self-pay | Admitting: Endocrinology

## 2015-08-02 NOTE — Telephone Encounter (Signed)
Dates given to her.

## 2015-08-02 NOTE — Telephone Encounter (Signed)
Candis Musa from  [facility] called she need to know when patient last Flu shot and Pneumo Vaccine was [phone]

## 2015-08-14 IMAGING — CR DG FOOT 2V*L*
2 series · 2 of 2 positions shown · non-contrast
Comparison: 04/30/2013.

CLINICAL DATA: Open wound to the bottom of the LEFT foot. Diabetic
neuropathy.

EXAM:
LEFT FOOT - 2 VIEW

[t foot ap left]
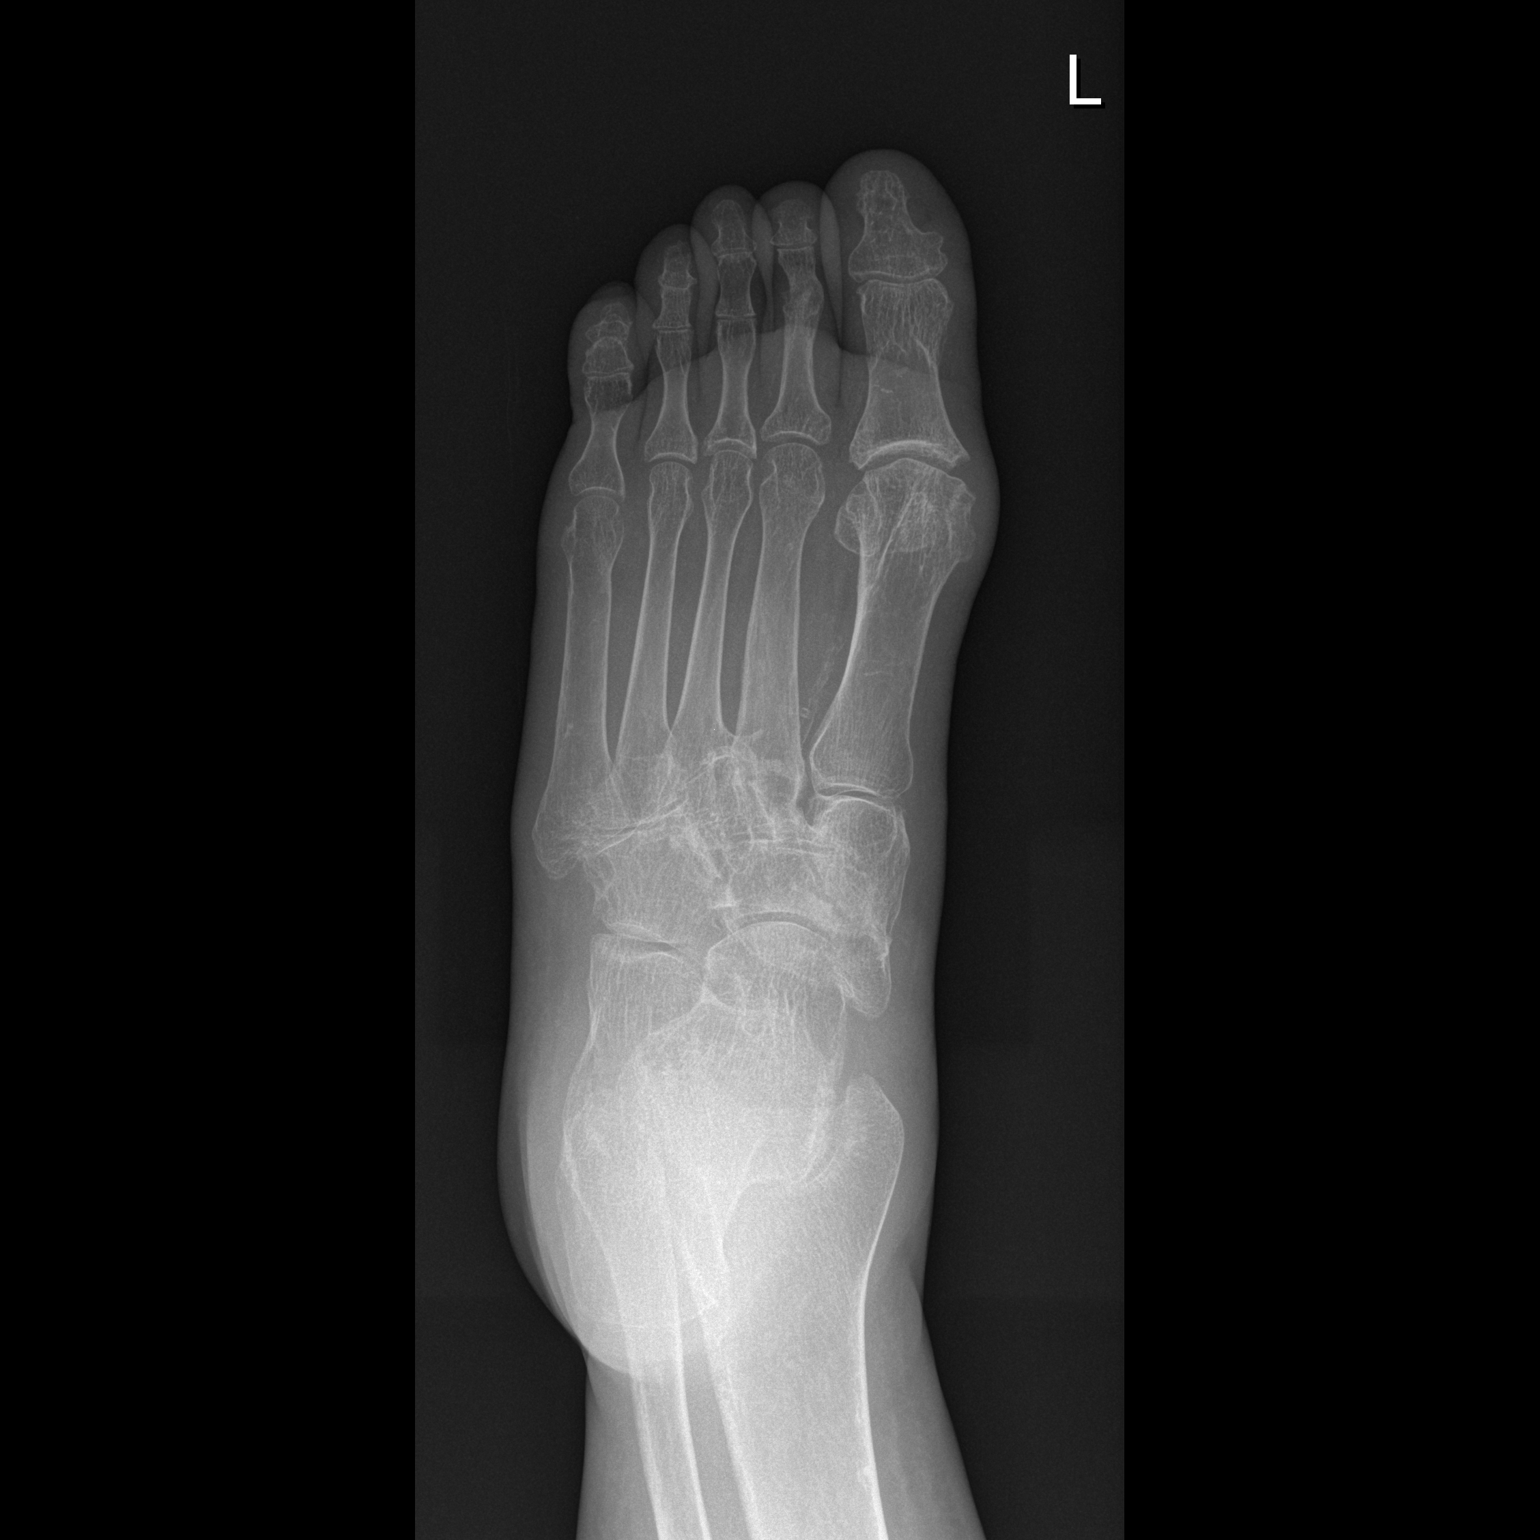

[t foot lat left]
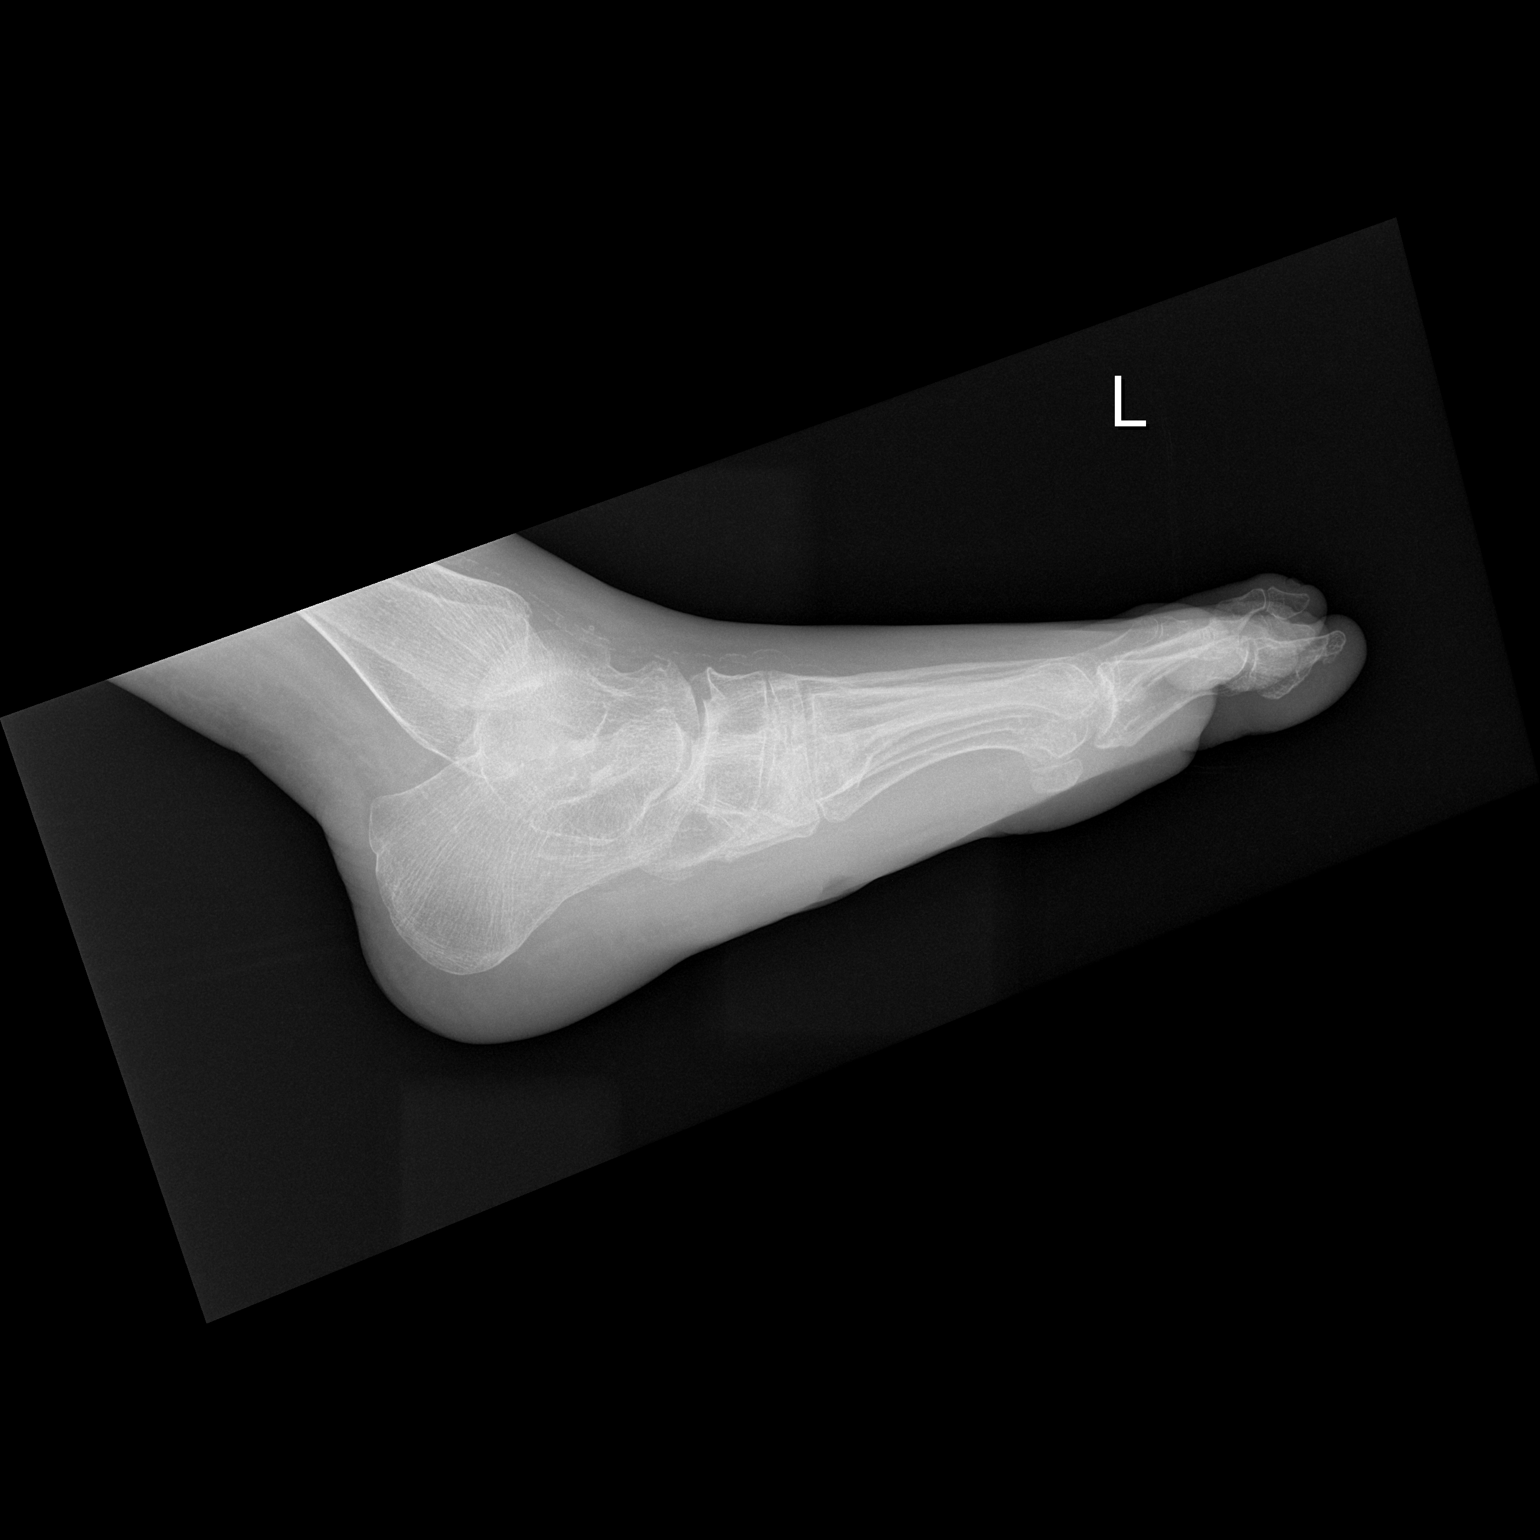

[2 of 2 positions shown; findings below may reference images not displayed]

FINDINGS: Diffuse osteopenia. Vascular calcification. Cellulitic change along
the plantar aspect of the foot, with flatfoot deformity as seen on
the lateral view. No definite changes of osteomyelitis. Indistinct
midfoot osseous changes with remodeling of the metatarsal bases.
IMPRESSION: Chronic osseous changes as described. No definite findings to
suggest active osteomyelitis. Cellulitic change with flatfoot
deformity.

## 2015-08-14 IMAGING — CR DG FOOT COMPLETE 3+V*R*
3 series · 3 of 3 positions shown · non-contrast
Comparison: August 11, 2012

CLINICAL DATA: Recent trauma.  Diabetic neuropathy

EXAM:
RIGHT FOOT COMPLETE - 3+ VIEW

[t foot ap right]
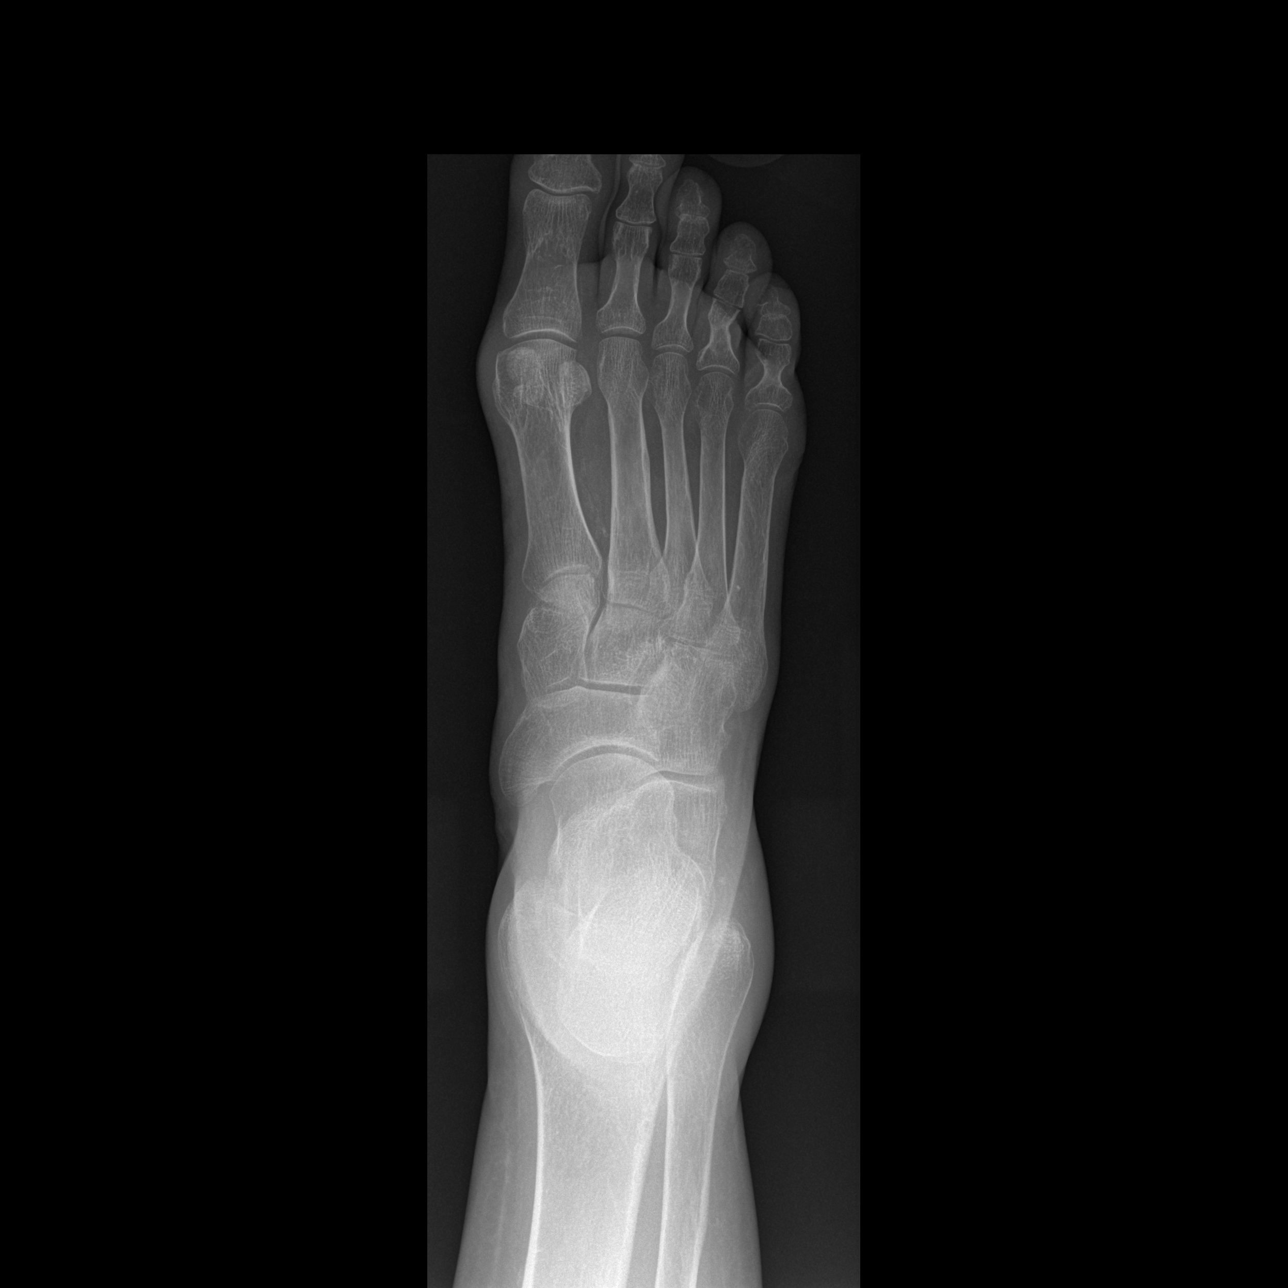

[t foot oblique right]
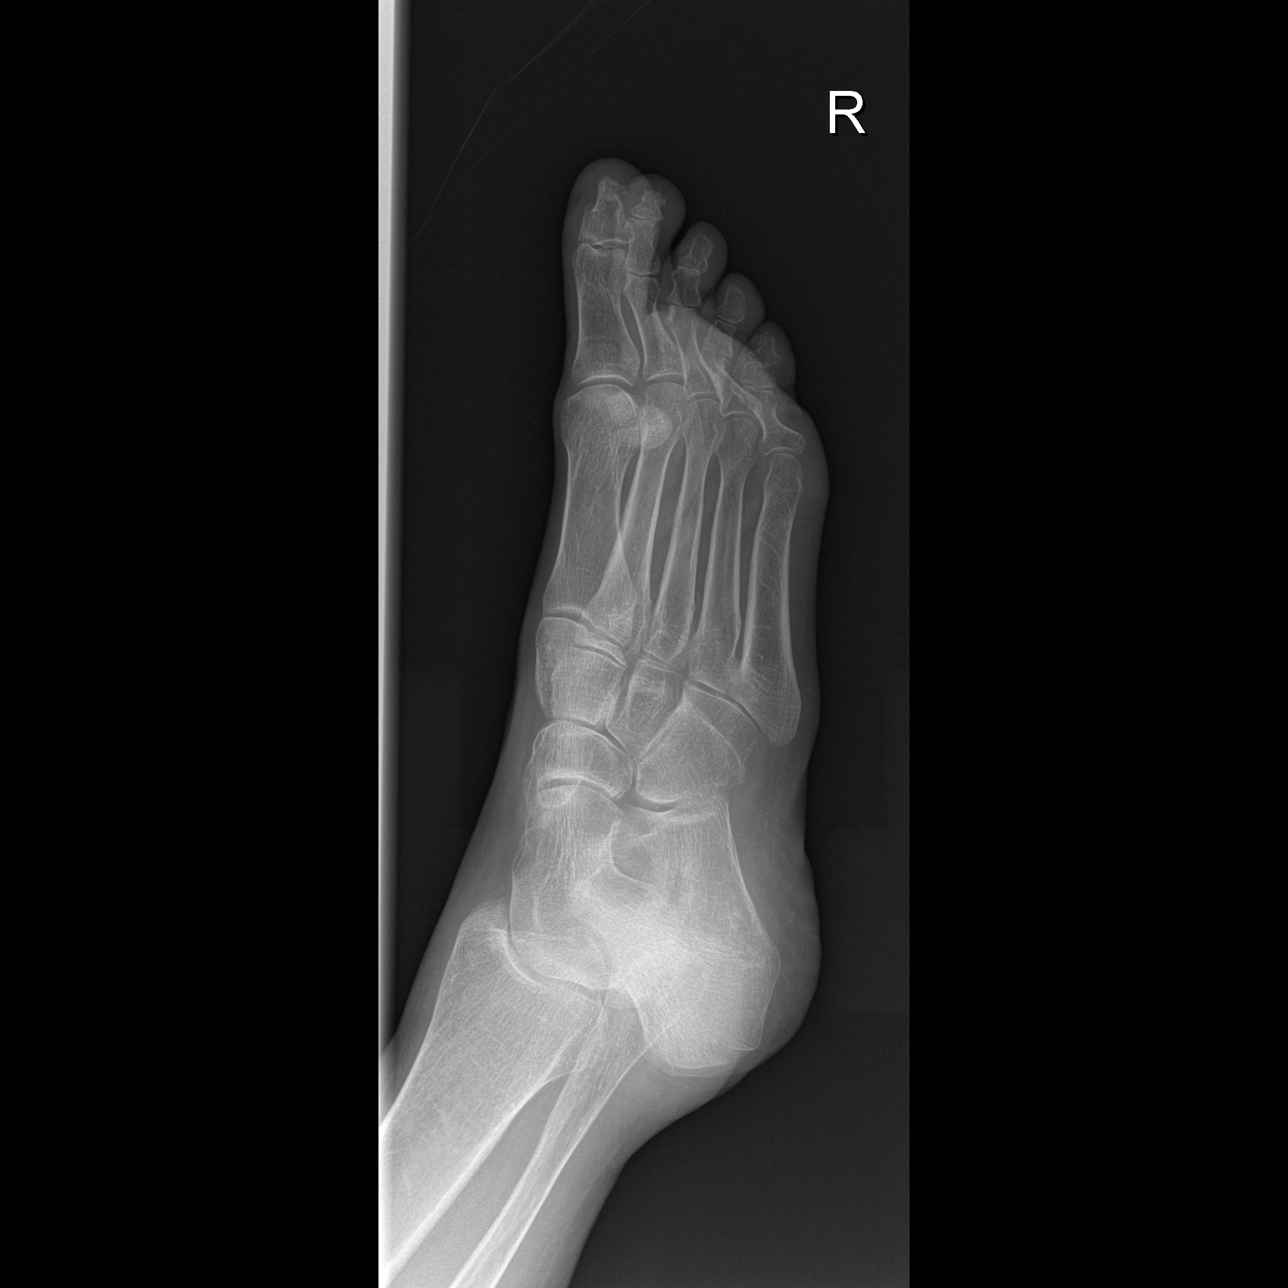

[t foot lat right]
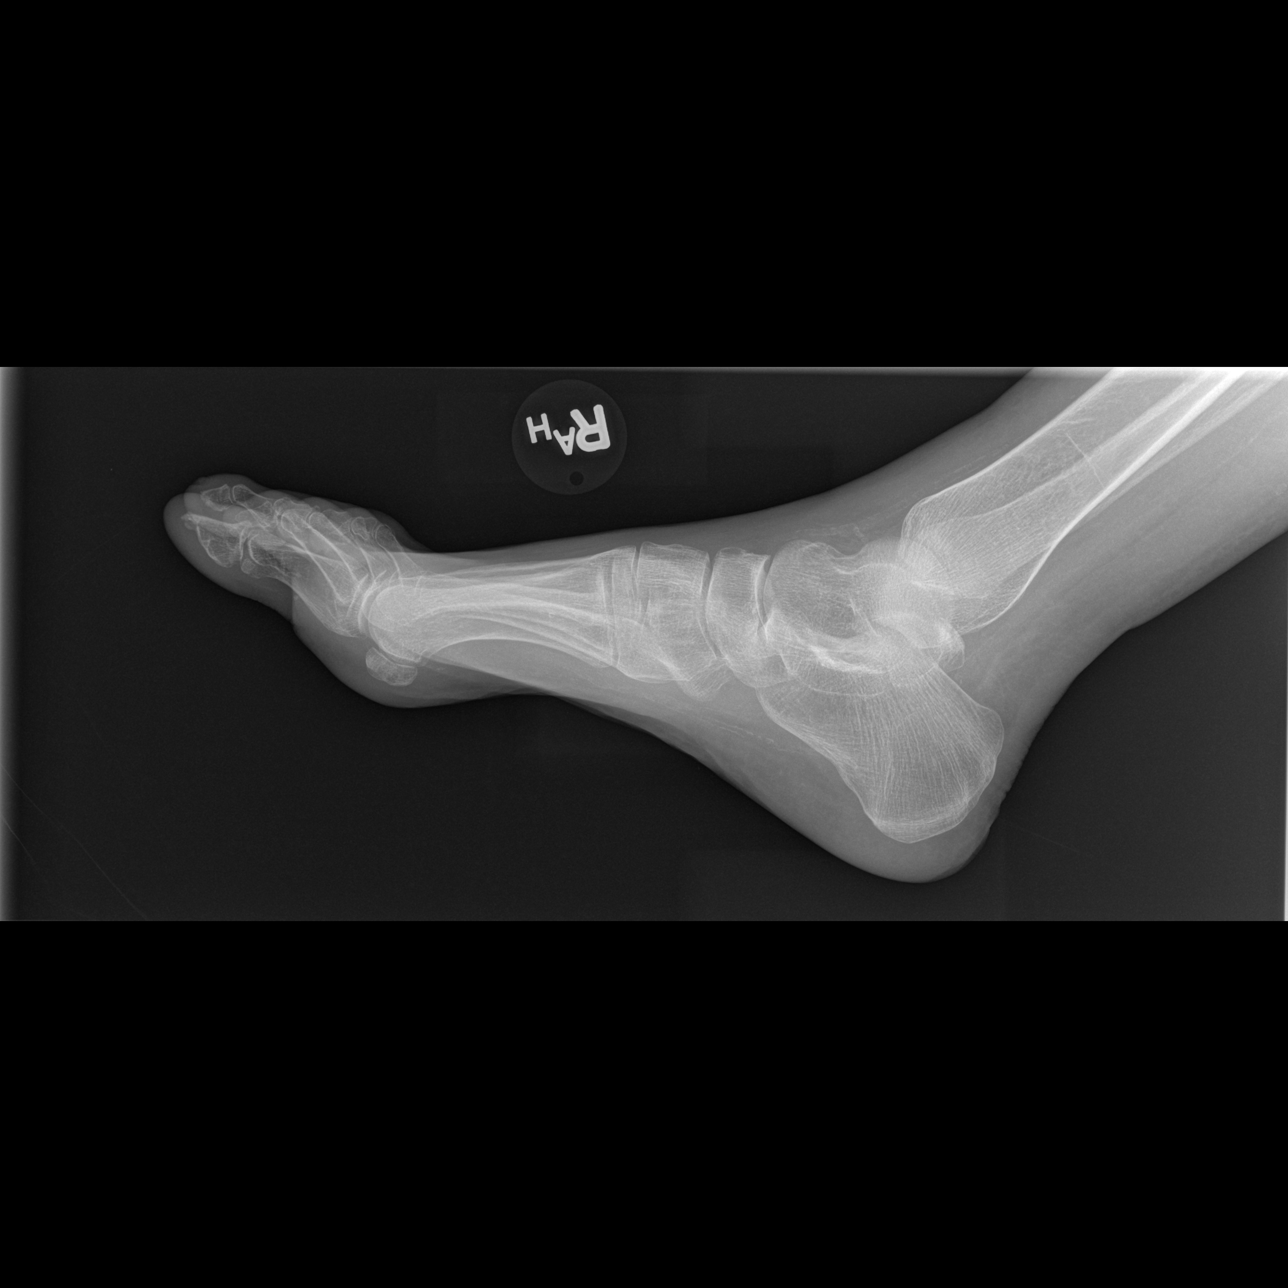

[3 of 3 positions shown; findings below may reference images not displayed]

FINDINGS: Frontal, oblique, and lateral views were obtained. There is a prior
fracture of the fourth proximal phalanx which shows healing compared
to the previous study. There is slight lateral angulation of the
distal aspect of the bone post trauma. There is no apparent acute
fracture or dislocation. Joint spaces appear intact. No erosive
change. No bony destruction. There are foci of arterial vascular
calcification.
IMPRESSION: Prior fracture of the fourth proximal phalanx with remodeling. No
acute fracture or dislocation. No erosive change or bony
destruction. Extensive vascular calcification is consistent with
known diabetes mellitus.

## 2015-09-01 IMAGING — MR MR FOOT*L* W/O CM
4 of 7 series · 19 of 40 positions shown · non-contrast
Comparison: MRI left foot 06/12/2013. Plain films left foot
03/19/2014.

CLINICAL DATA: Chronic diabetic foot ulcer.  Subsequent encounter.

EXAM:
MRI OF THE LEFT FOREFOOT WITHOUT CONTRAST
TECHNIQUE: Multiplanar, multisequence MR imaging was performed. No intravenous
contrast was administered.

[Series 4: T2 · sagittal · 4.0mm · 0.35mm/px · 3 of 21 slices shown (1 of 2)]
[im 5/21]
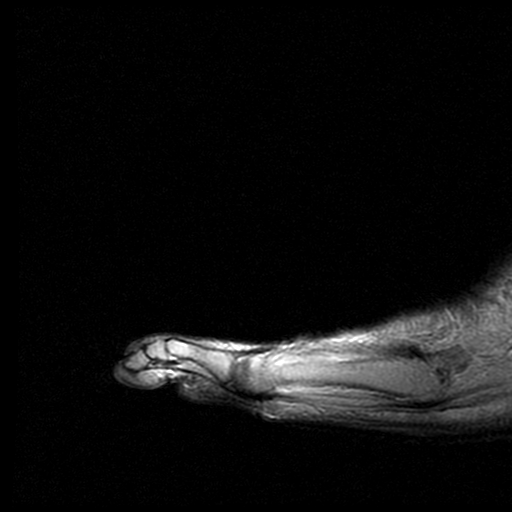
[im 13/21]
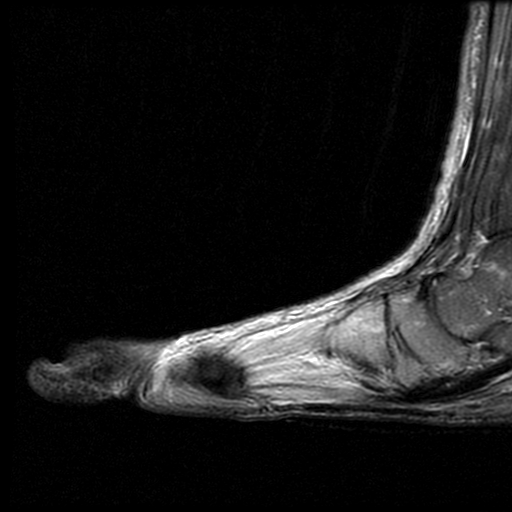
[im 21/21]
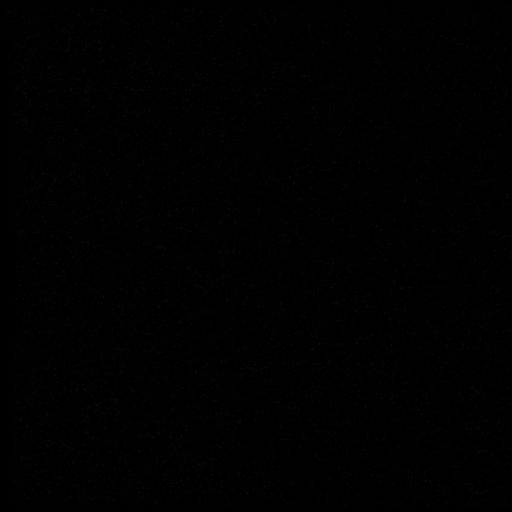

[Series 5: T1 · coronal · 4.0mm · 0.31mm/px · 9 of 34 slices shown (1 of 2)]
[im 1/34]
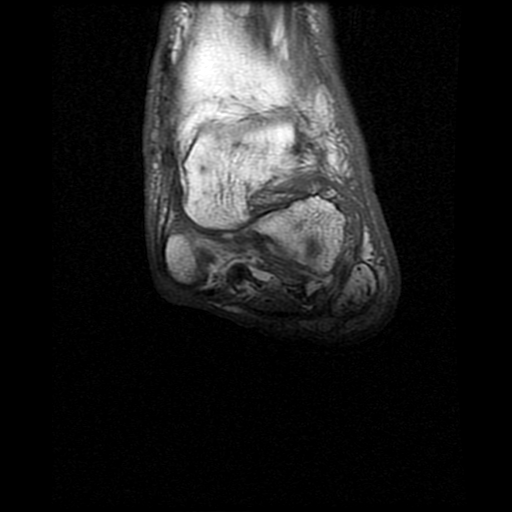
[im 5/34]
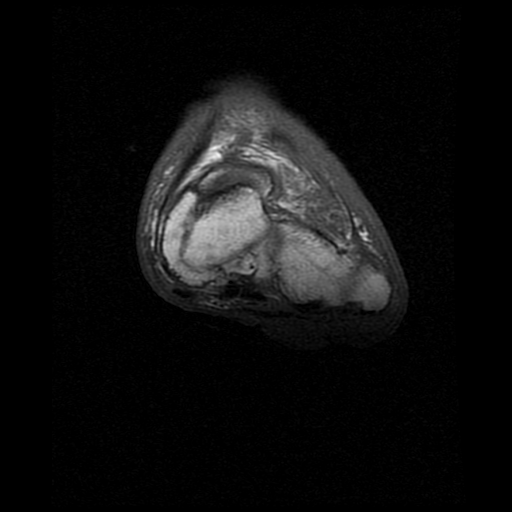
[im 9/34]
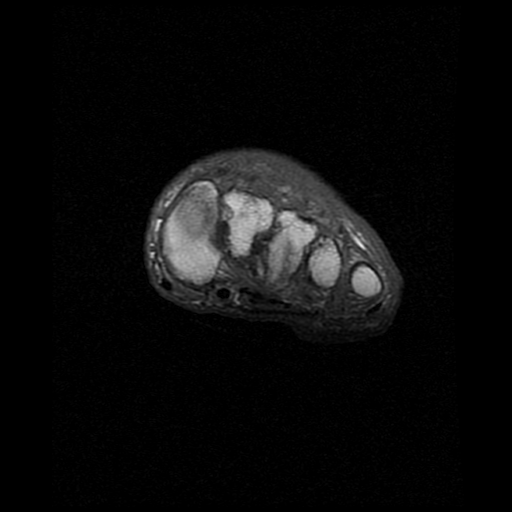
[im 13/34]
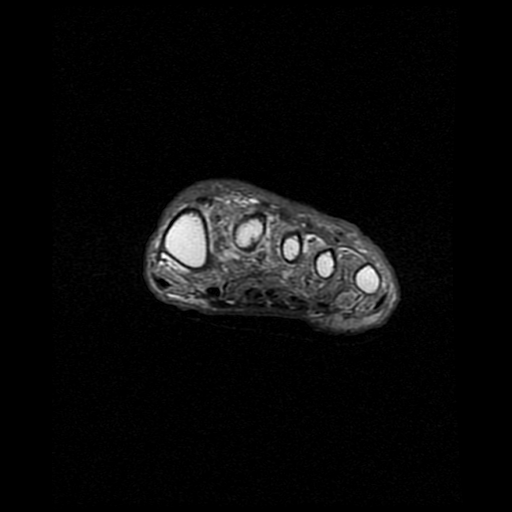
[im 17/34]
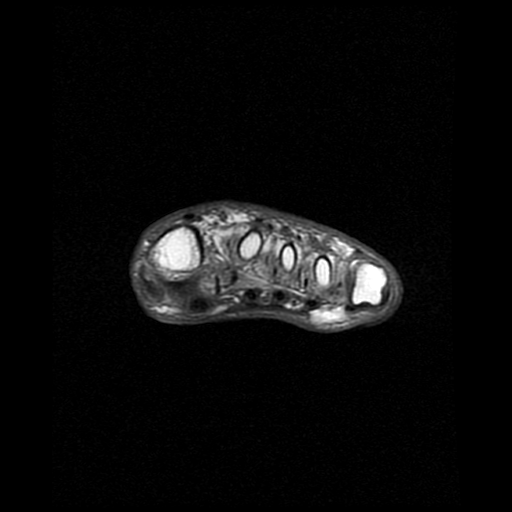
[im 21/34]
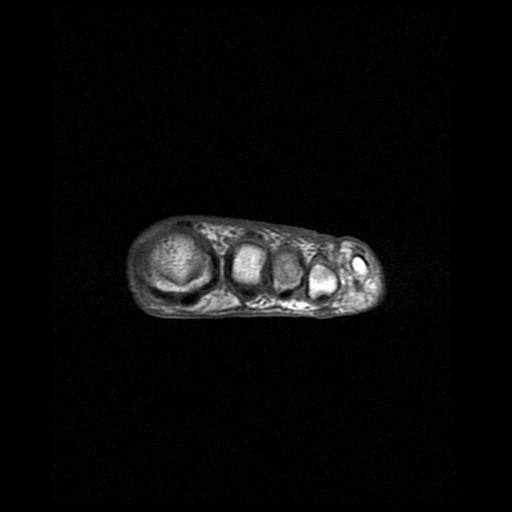
[im 25/34]
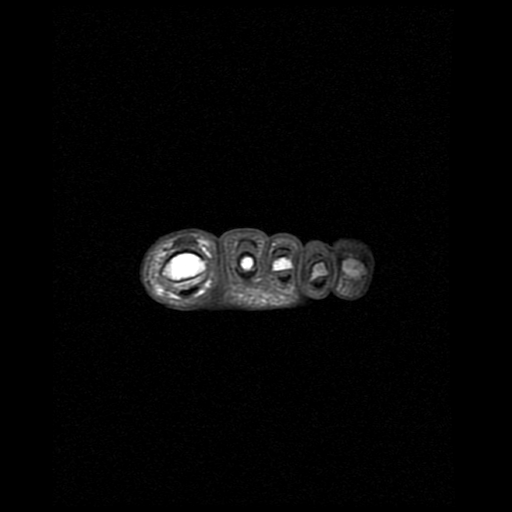
[im 29/34]
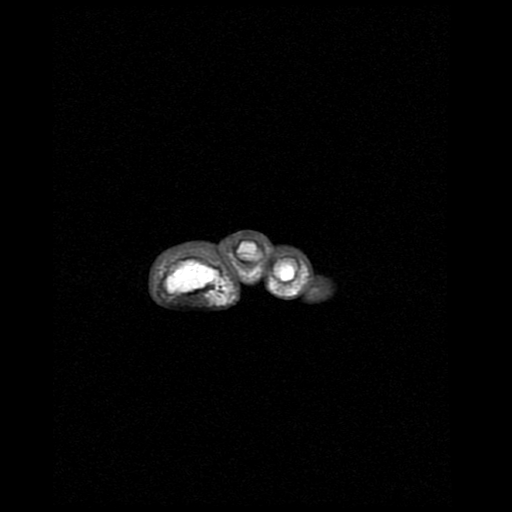
[im 34/34]
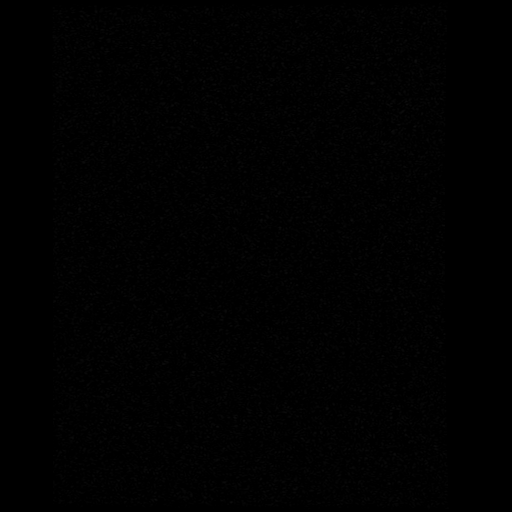

[Series 6: T2 · coronal · 4.0mm · 0.31mm/px · 3 of 34 slices shown (2 of 2)]
[im 5/34]
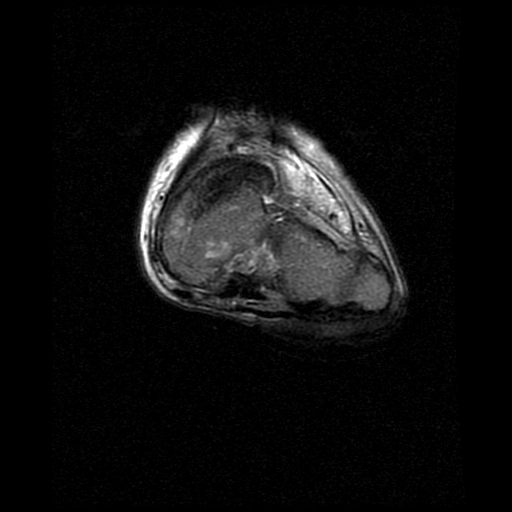
[im 17/34]
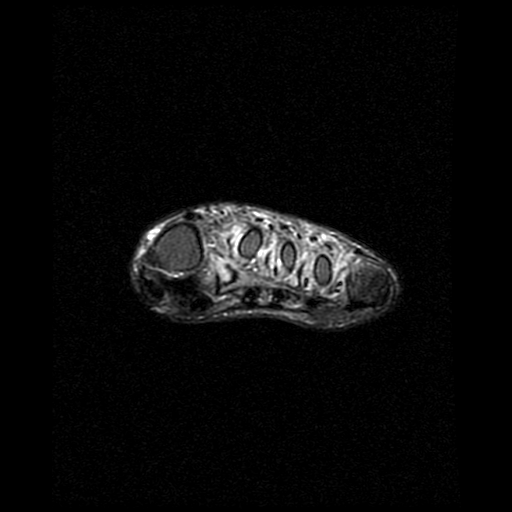
[im 29/34]
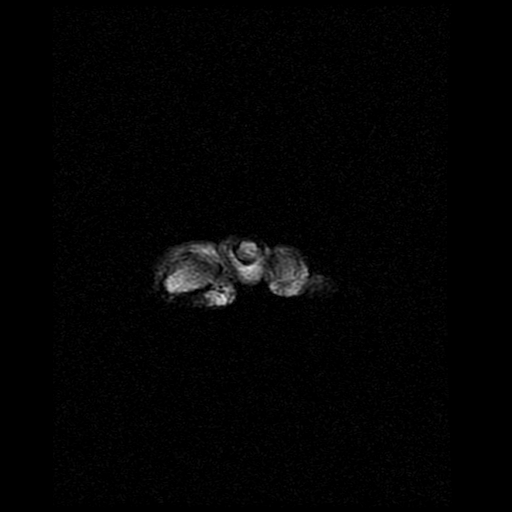

[Series 7: T1 · axial · 4.0mm · 0.31mm/px · z∈[-54,+21]mm · 4 of 16 slices shown (2 of 2)]
[im 1/16]
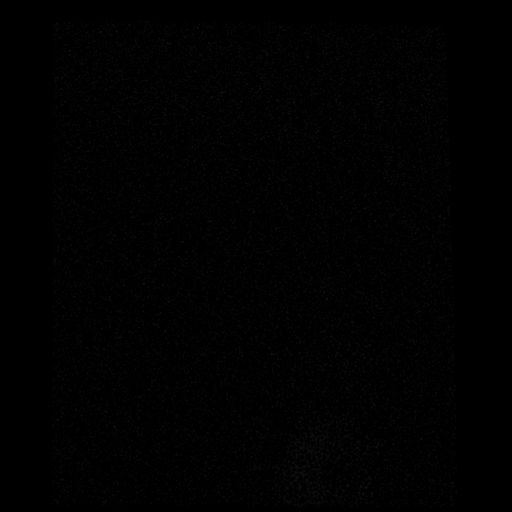
[im 6/16]
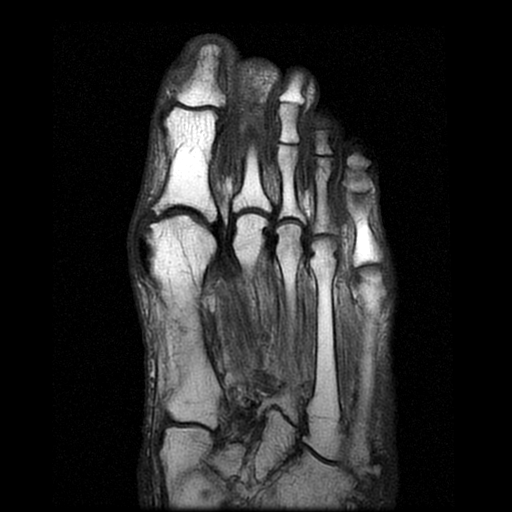
[im 11/16]
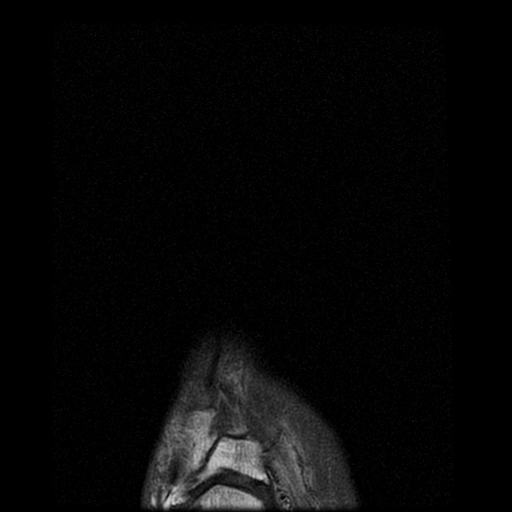
[im 16/16]
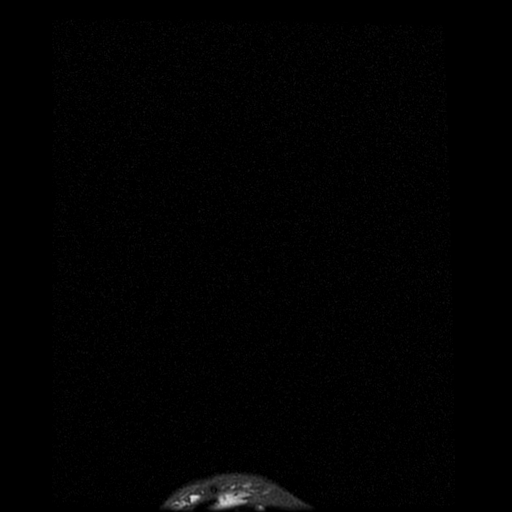

[19 of 40 positions shown; findings below may reference images not displayed]

FINDINGS: There is no focal fluid collection. Mild subcutaneous edema is seen
over the dorsum of the foot. Intrinsic musculature of the foot shows
fatty atrophy. All visualized tendons are intact. Mild marrow edema
seen about the third, fourth and fifth MTP joints and about the PIP
joints of the fourth and fifth toes. No joint effusion is
identified. Marrow edema in the second, third and fourth toes seen
on the comparison MRI has markedly improved. No fracture is
identified. Early Charcot change in the midfoot is noted as seen on
prior plain films.
IMPRESSION: New, mild marrow edema about the third through fifth MTP joints and
PIP joints of the fourth and fifth toes could be due to stress
change or reactive signal change from cellulitis. The appearance is
not typical of osteomyelitis. There is no evidence of septic joint.

Marked improvement in marrow signal abnormality and second, third
and fourth toes since the prior MRI

Mild edema over the dorsum of the foot could be due to dependent
change or cellulitis.

Negative for abscess.

Early Charcot change midfoot.

## 2015-09-13 ENCOUNTER — Telehealth: Payer: Self-pay | Admitting: Endocrinology

## 2015-09-13 NOTE — Telephone Encounter (Signed)
The orthotic company called and said they faxed over some forms that need to be filled out by [place] in order for [place] to bill the insurance for the PT supplies. CB# [phone] Caryl Pina

## 2015-09-19 NOTE — Telephone Encounter (Signed)
I contacted Caryl Pina at [facility] and requested the forms to be sent again. She stated she would re send the forms.

## 2015-09-19 NOTE — Telephone Encounter (Signed)
The [facility] [employer] called, checking up on the forms that was fax over.

## 2015-09-26 ENCOUNTER — Telehealth: Payer: Self-pay | Admitting: Endocrinology

## 2015-09-26 NOTE — Telephone Encounter (Signed)
Forms have been placed on Md's desk. Waiting on signature.

## 2015-09-26 NOTE — Telephone Encounter (Signed)
[facility] calling they faxed over some forms for the diabetic shoes what is the status # [phone]

## 2015-09-29 ENCOUNTER — Telehealth: Payer: Self-pay | Admitting: Endocrinology

## 2015-09-29 NOTE — Telephone Encounter (Signed)
PT returned my call about scheduling appt, [facility] Note: caller stated she is currently in a residential rehab and doesn't know when she will get out.  She recently had lab work done.  May be able to have blood work done at facility if there is something specific he needs.

## 2015-09-29 NOTE — Telephone Encounter (Signed)
May need to have an A1c done.  She can still make an appointment to come and see me from the facility since she is overdue for her visit with her insulin pump

## 2015-09-29 NOTE — Telephone Encounter (Signed)
Caitlin, Could you see below and possibly schedule?

## 2015-09-29 NOTE — Telephone Encounter (Signed)
Called PT again with no answer.  Also, The message I received this morning stated she didn't know when she would be available to come into the office because she is in the residential rehab.

## 2015-10-21 IMAGING — CR DG FOOT COMPLETE 3+V*L*
3 series · 3 of 3 positions shown · non-contrast
Comparison: Left foot series of March 19, 2014

CLINICAL DATA: Nonhealing ulcer along the plantar aspect of the
base of the fifth metatarsal full past 3 months, history of diabetes

EXAM:
LEFT FOOT - COMPLETE 3+ VIEW

[t foot ap left (1 of 2)]
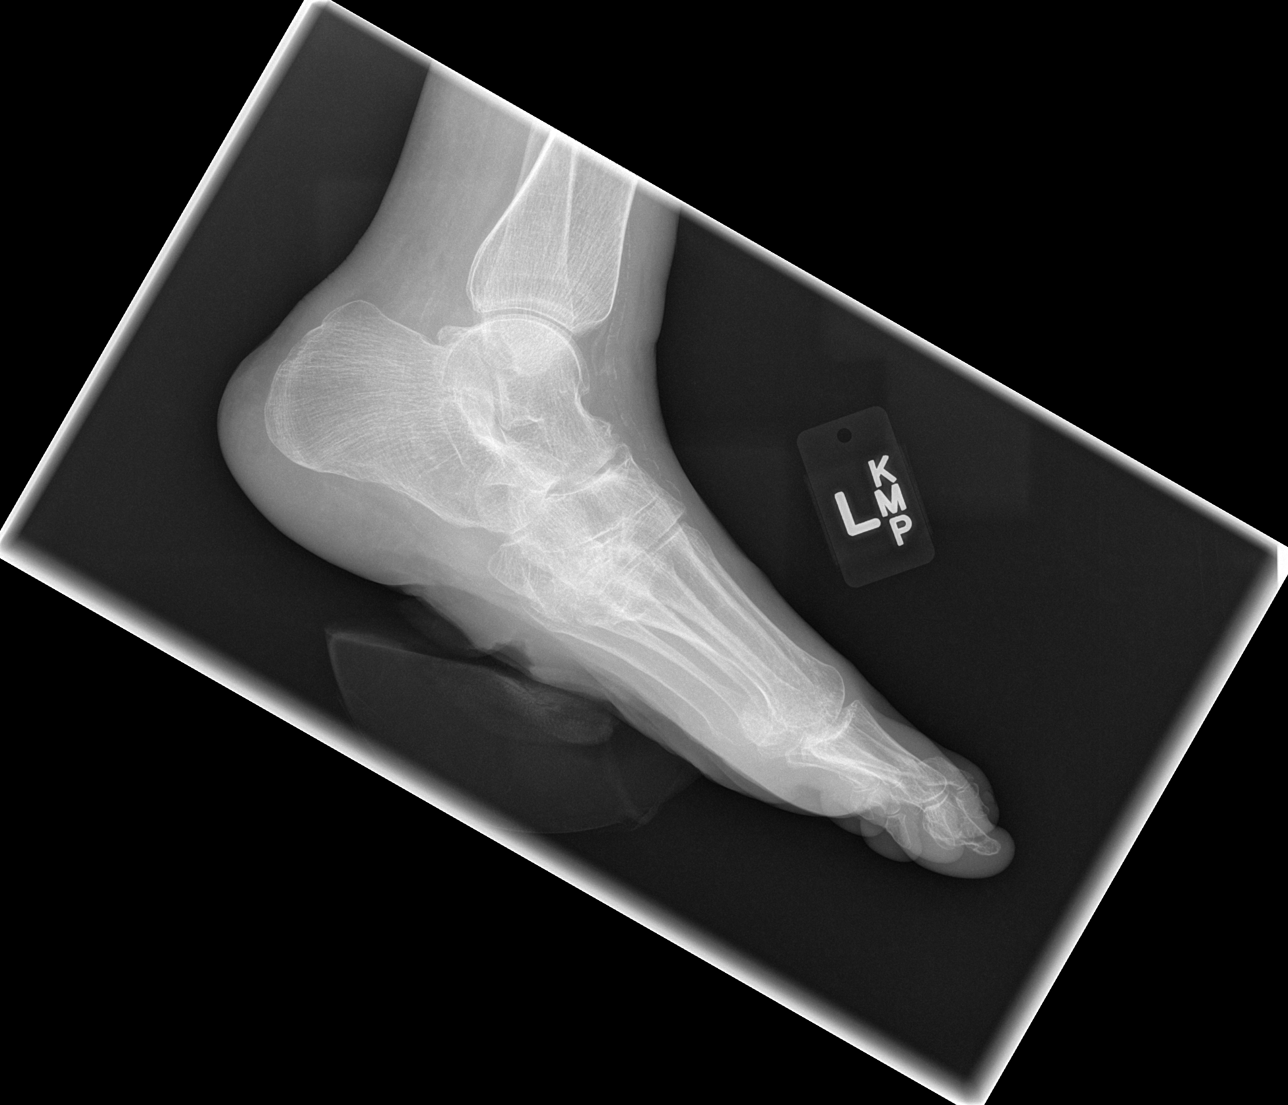

[t foot ap left (2 of 2)]
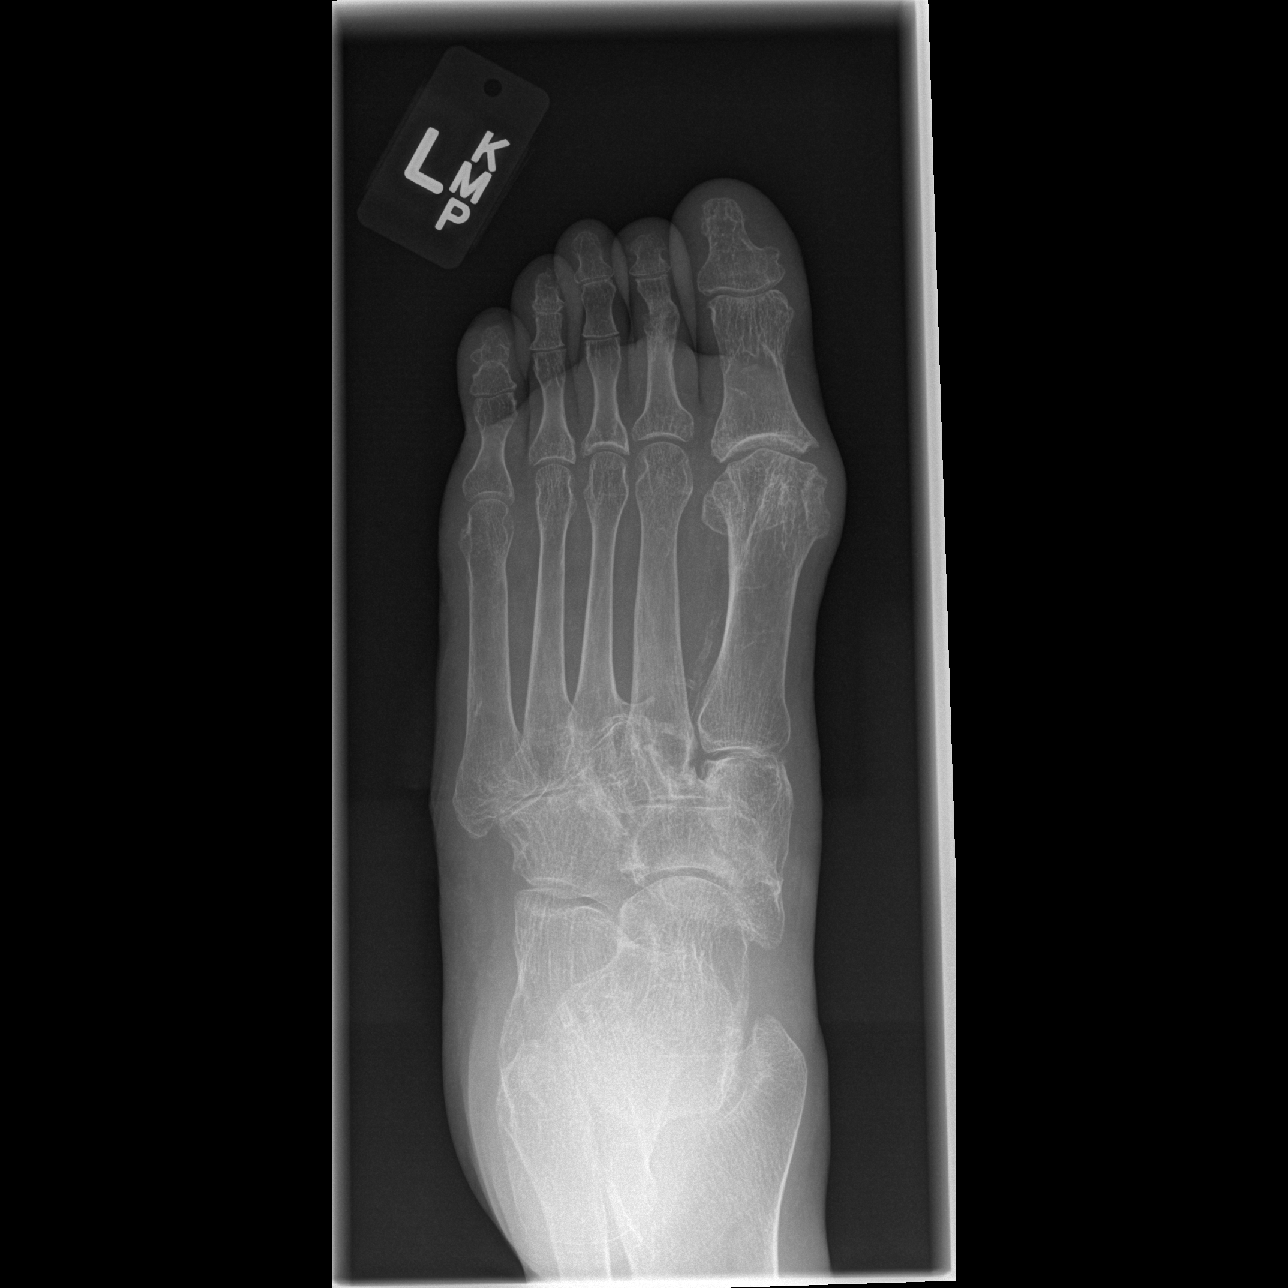

[t foot lat left]
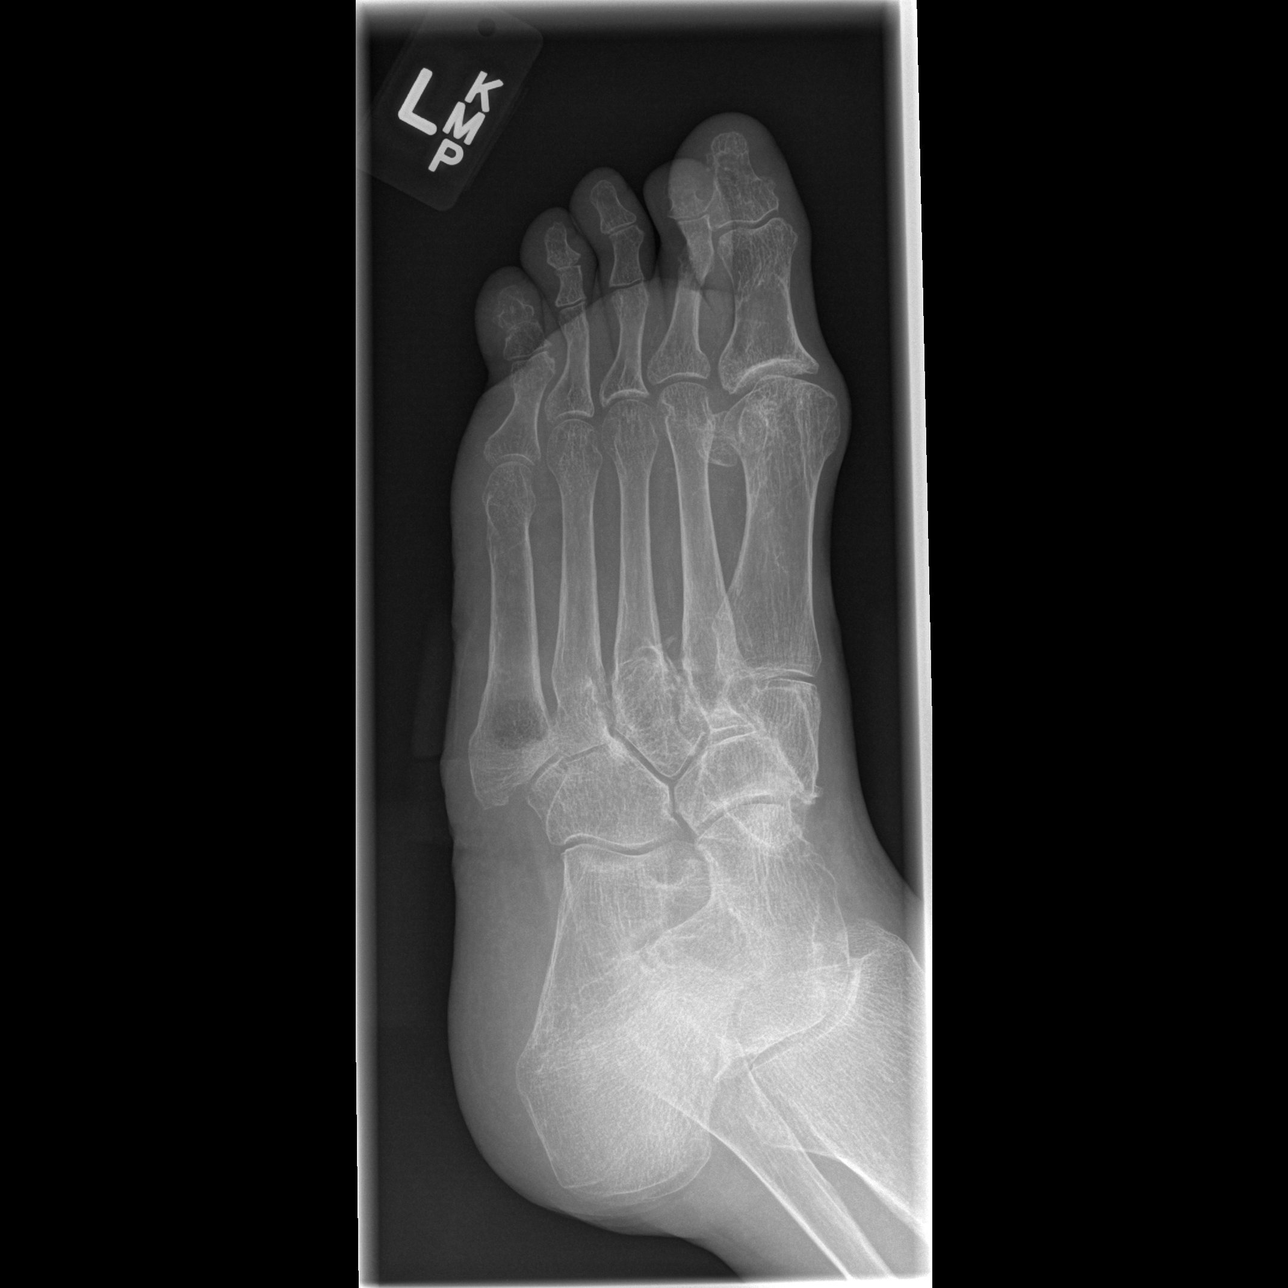

[3 of 3 positions shown; findings below may reference images not displayed]

FINDINGS: There is diffuse osteopenia. There is no acute fracture. The
interphalangeal joint spaces are narrowed diffusely. There is fusion
across the PIP joint of the second toe. There are chronic changes
involving the metatarsal bases of the second through fifth
metatarsal. There is no lytic lesion. There are vascular
calcifications present. There are no soft tissue gas collections.
There is a deep cutaneous ulcer along the plantar aspect of the base
of the fifth metatarsal. There is flatfoot deformity which is
stable.
IMPRESSION: There is a deep cutaneous ulcer overlying the base of the fifth
metatarsal. No osteomyelitis is demonstrated. There are chronic
changes of the tarsometatarsal joints. There is chronic fusion
across the PIP joint of the second toe.

## 2015-10-28 ENCOUNTER — Ambulatory Visit: Payer: Medicare Other | Admitting: Podiatry

## 2015-10-28 IMAGING — CR DG FOOT COMPLETE 3+V*R*
3 series · 3 of 3 positions shown · non-contrast
Comparison: 03/19/2014.

CLINICAL DATA: Subsequent encounter for diffuse right foot pain.
Symptoms are persisting for a few days.

EXAM:
RIGHT FOOT COMPLETE - 3+ VIEW

[t foot ap right]
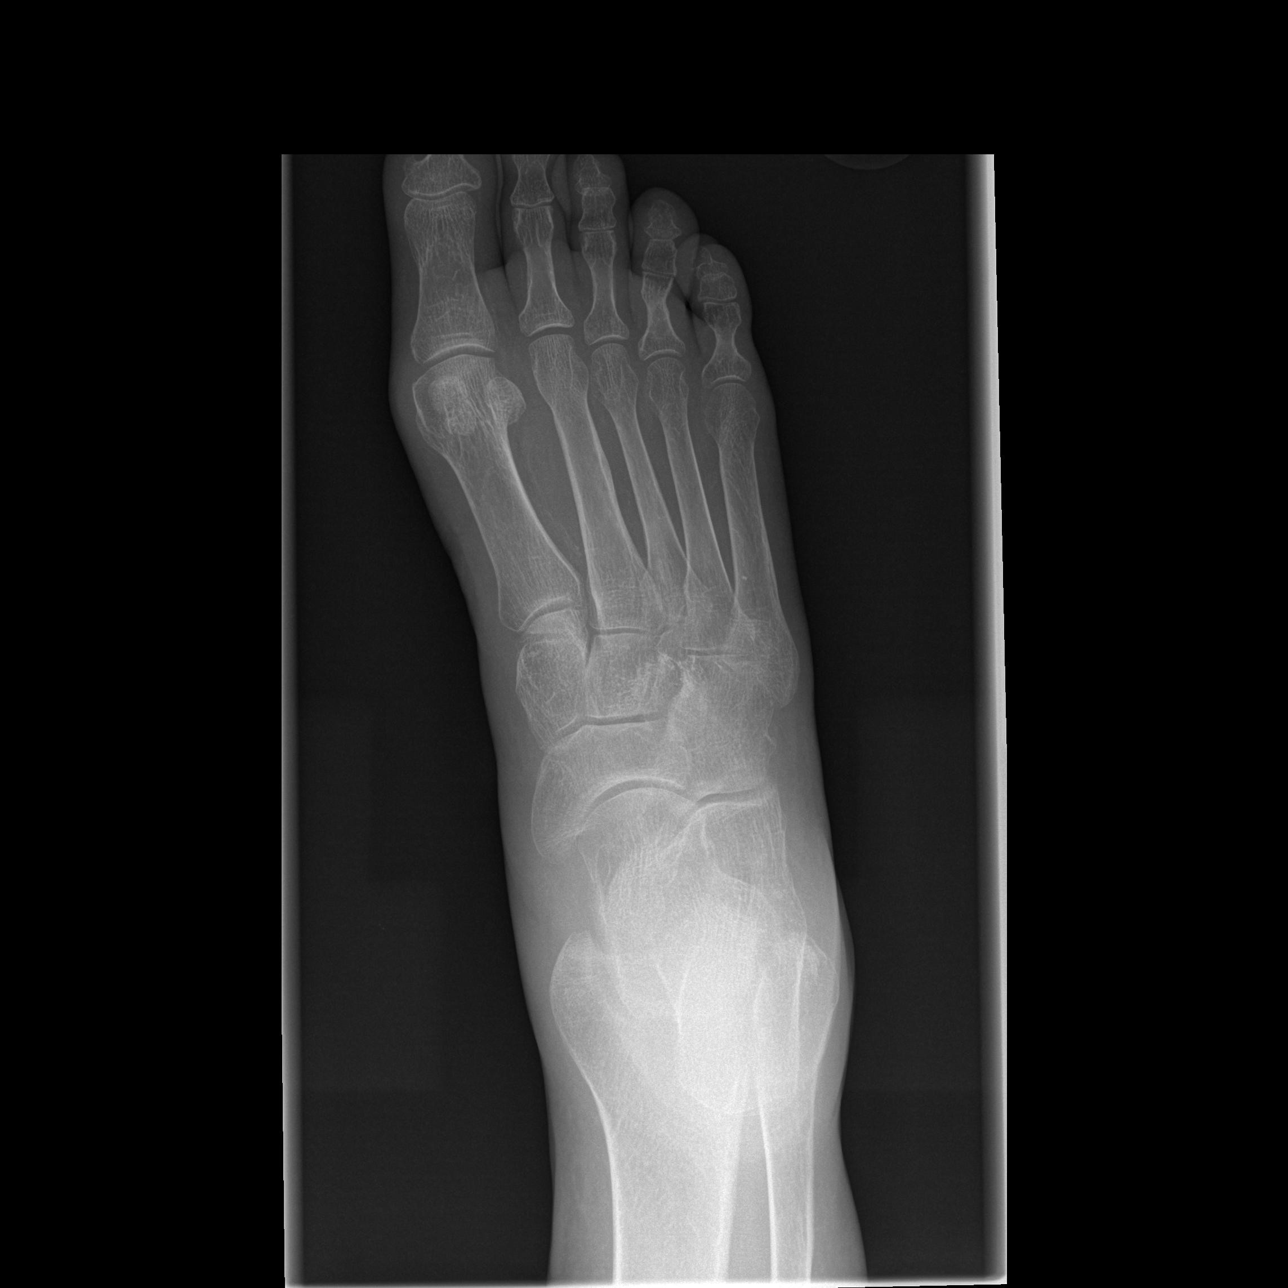

[t foot oblique right]
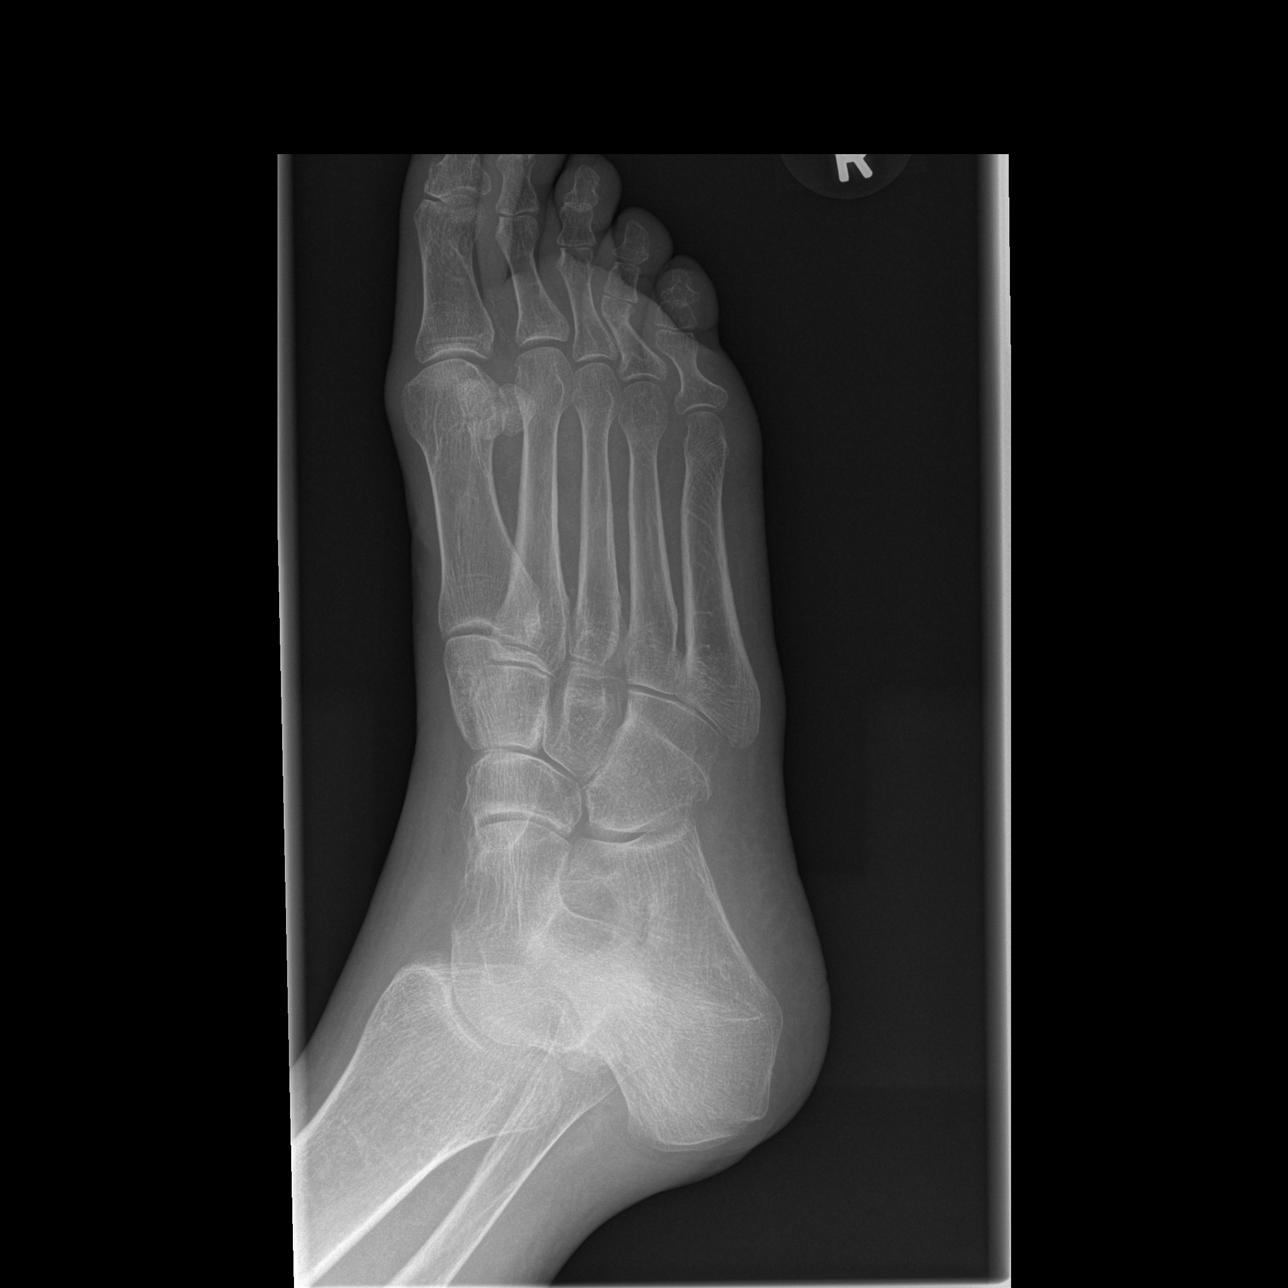

[t foot lat right]
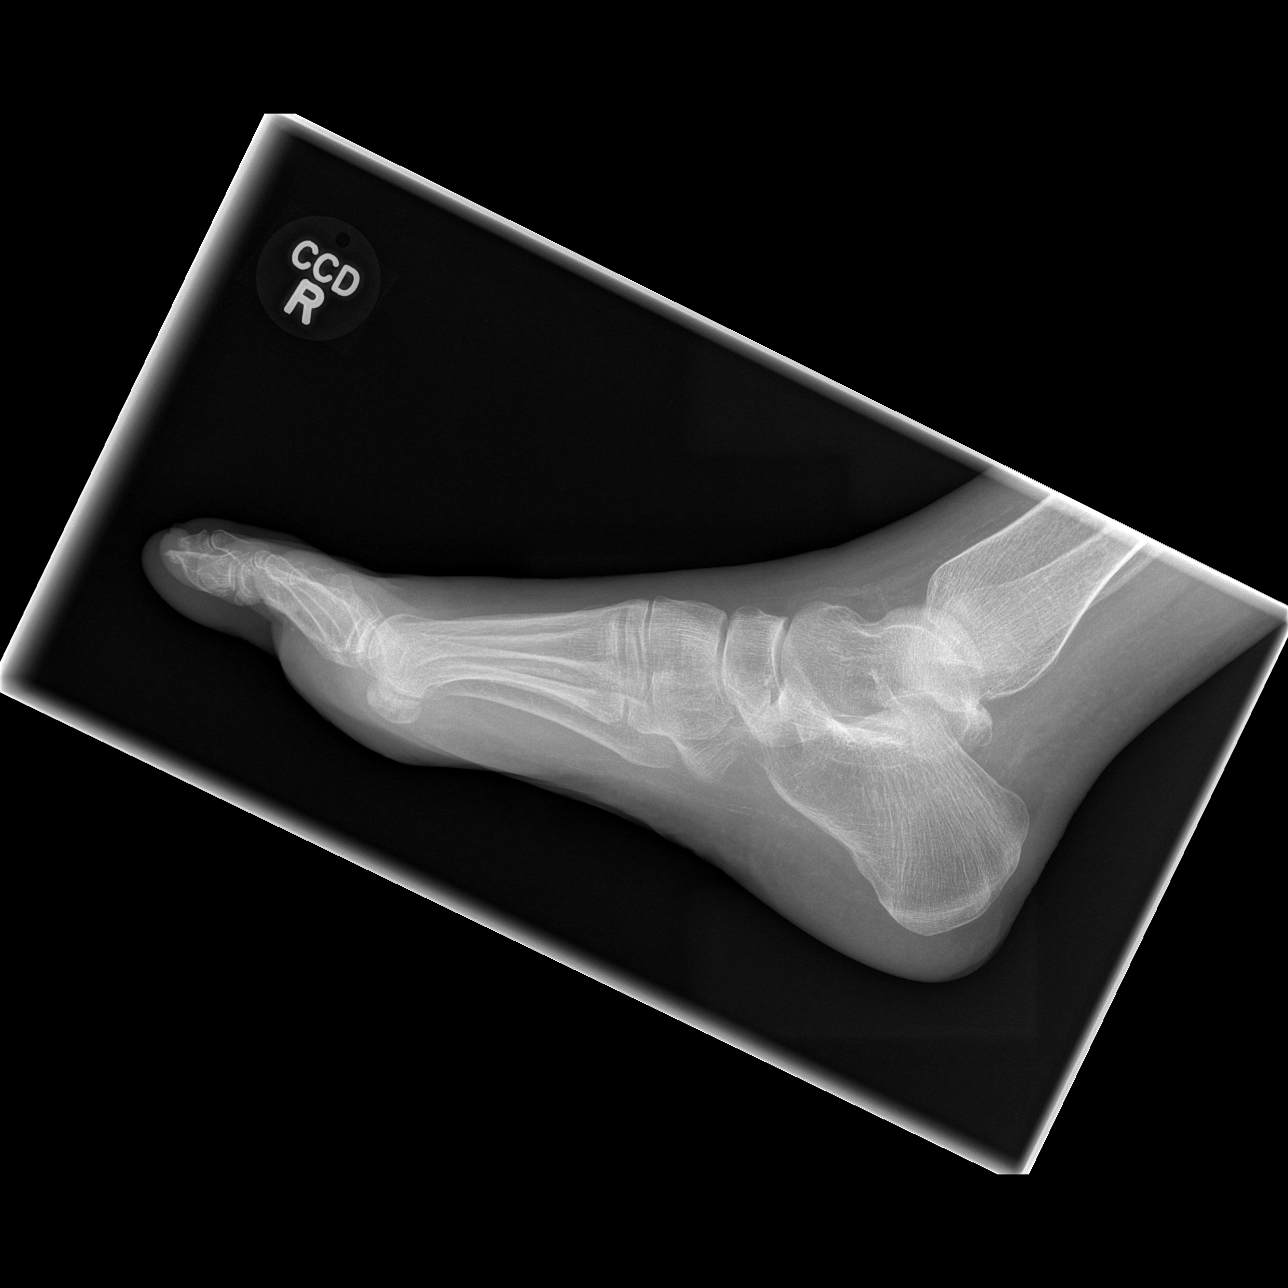

[3 of 3 positions shown; findings below may reference images not displayed]

FINDINGS: No evidence for an acute fracture. Deformity of the fourth toe
proximal phalanx is compatible with remote trauma. No worrisome
lytic or sclerotic osseous abnormality.
IMPRESSION: No acute bony findings.

## 2015-10-30 IMAGING — MR MR FOOT*R* W/O CM
4 of 5 series · 19 of 40 positions shown · non-contrast
Comparison: Radiographs dated 06/02/2014

CLINICAL DATA: Partial-thickness soft tissue ulcer of the plantar
aspect of the right foot under the great toe measuring 1 x 1 cm.
Surrounding erythema extending over the dorsum of the foot.
Cellulitis.

EXAM:
MRI OF THE RIGHT FOREFOOT WITHOUT CONTRAST
TECHNIQUE: Multiplanar, multisequence MR imaging was performed. No intravenous
contrast was administered.

[Series 6: T1 · coronal · 5.0mm · 0.29mm/px · 3 of 30 slices shown (1 of 2)]
[im 3/30]
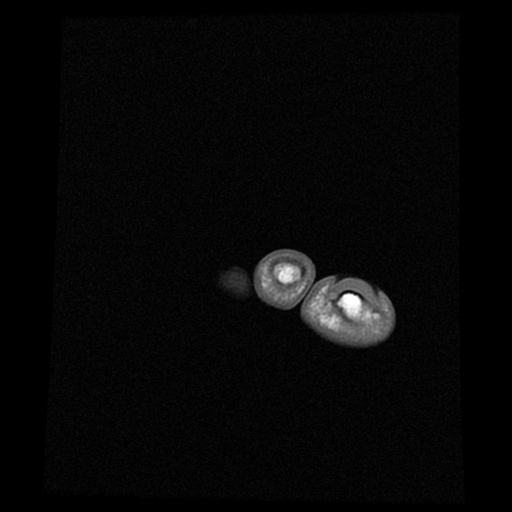
[im 15/30]
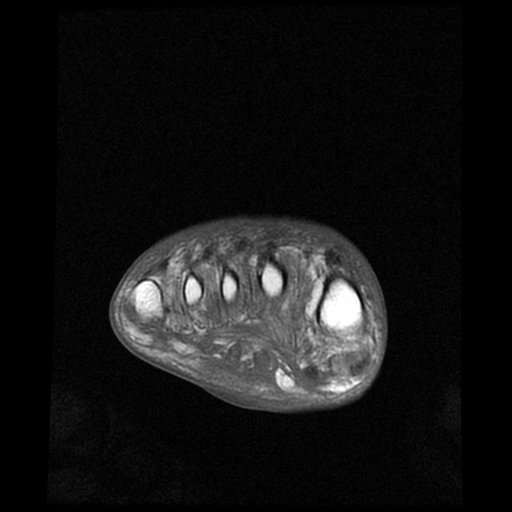
[im 27/30]
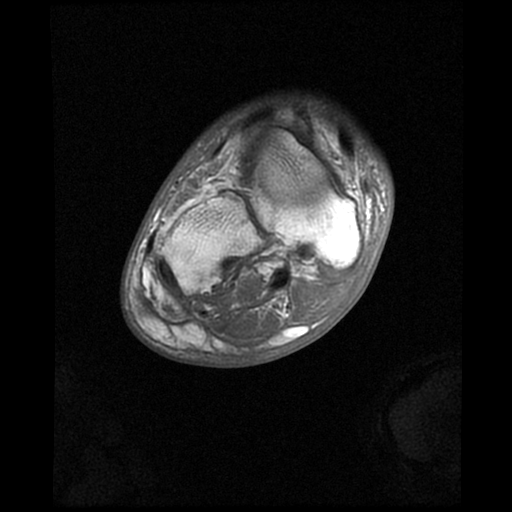

[Series 8: T2 fat-sat · coronal · 5.0mm · 0.29mm/px · 10 of 30 slices shown]
[im 1/30]
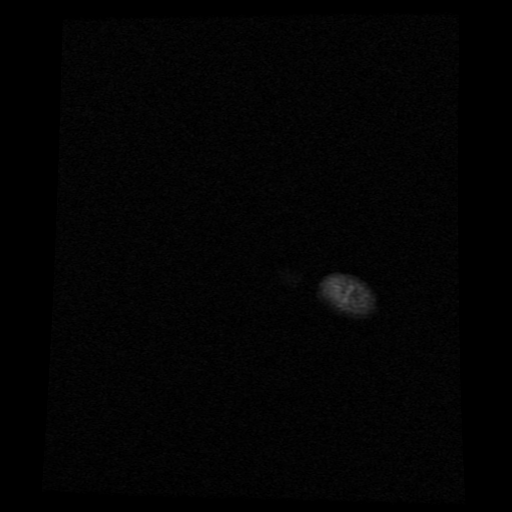
[im 3/30]
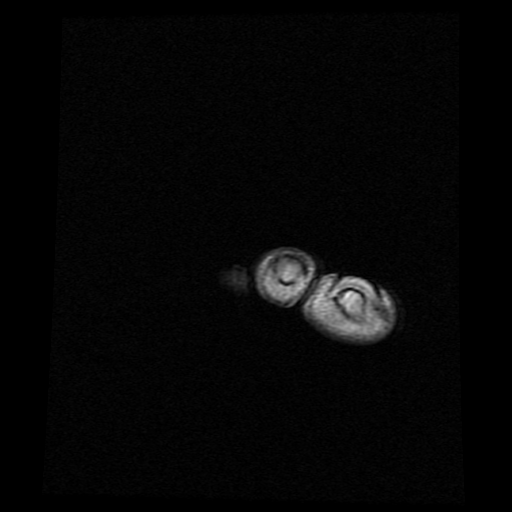
[im 6/30]
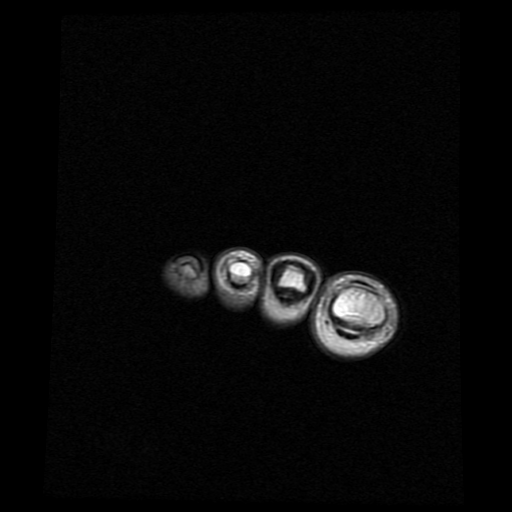
[im 9/30]
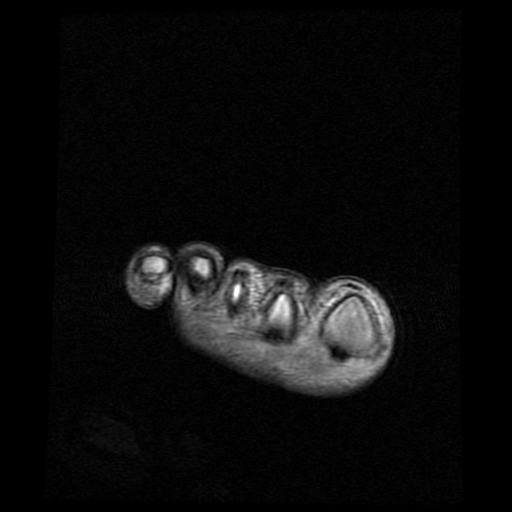
[im 12/30]
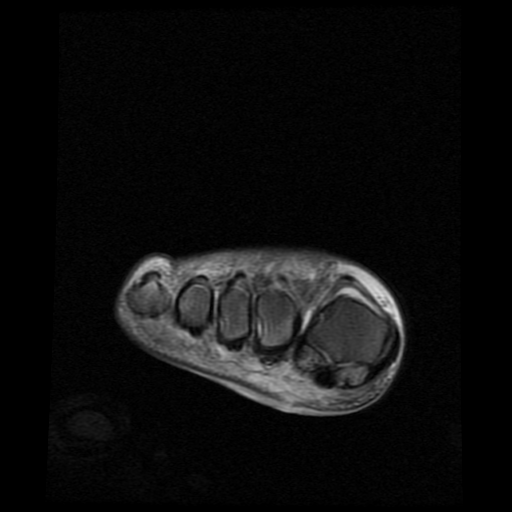
[im 15/30]
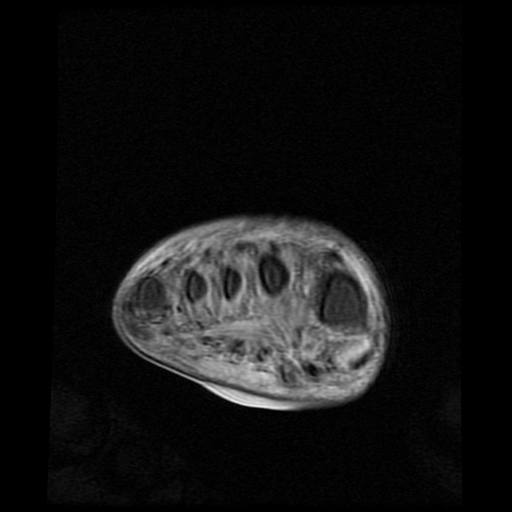
[im 18/30]
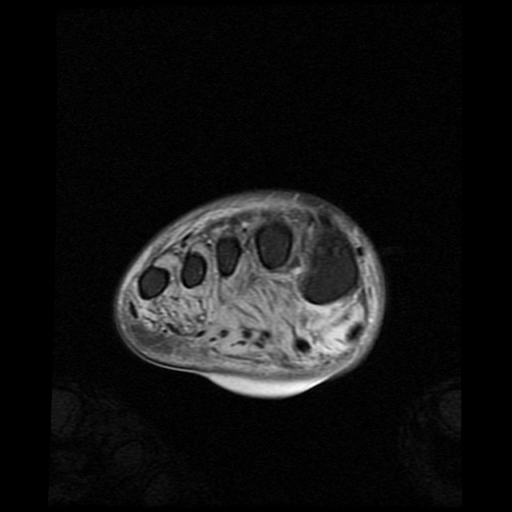
[im 21/30]
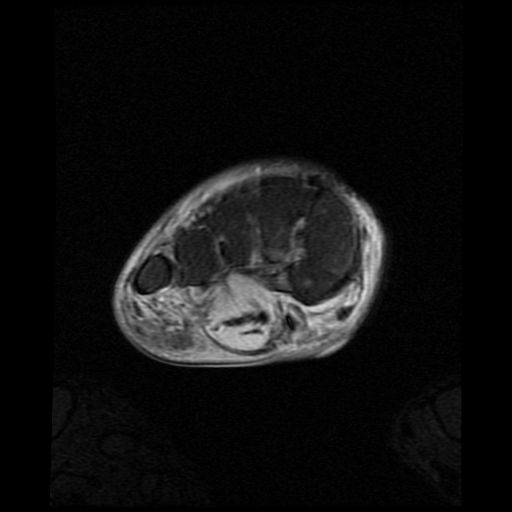
[im 24/30]
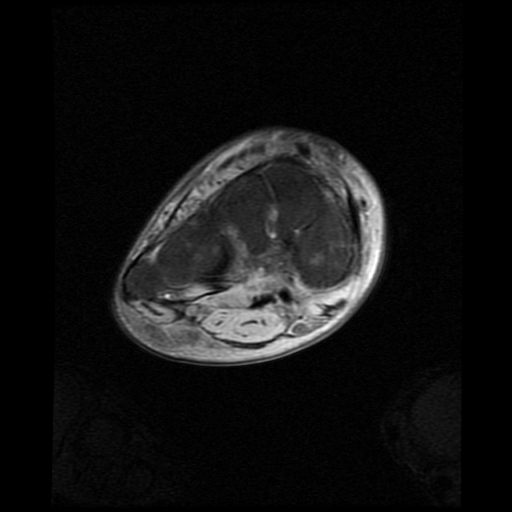
[im 27/30]
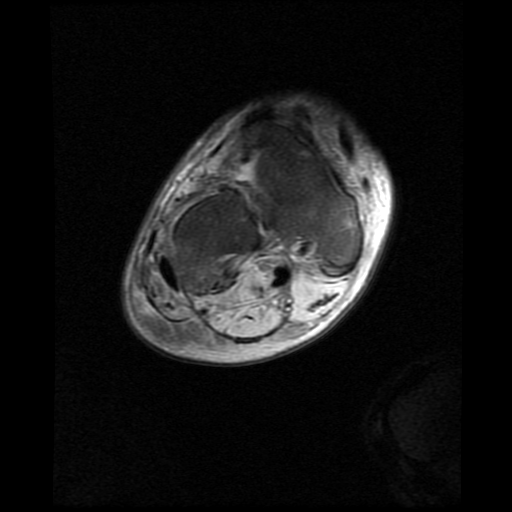

[Series 9: T1 · oblique · 4.0mm · 0.33mm/px · 3 of 18 slices shown (2 of 2)]
[im 4/18]
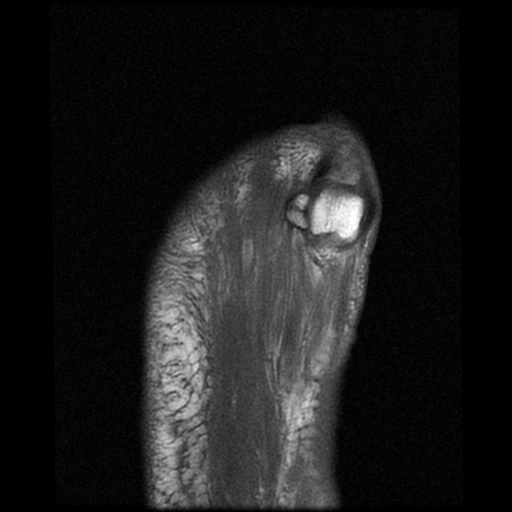
[im 11/18]
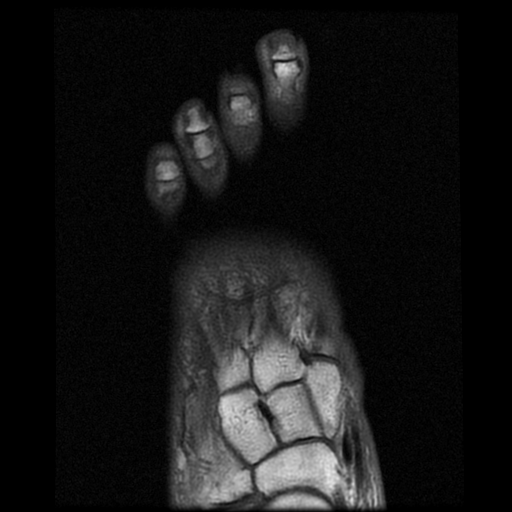
[im 18/18]
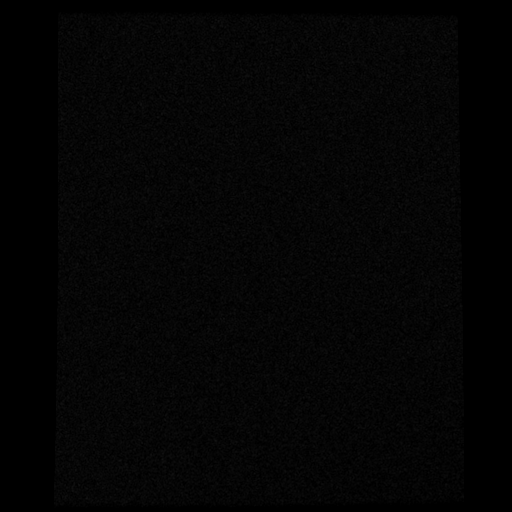

[Series 10: STIR · oblique · 4.0mm · 0.33mm/px · 3 of 18 slices shown]
[im 4/18]
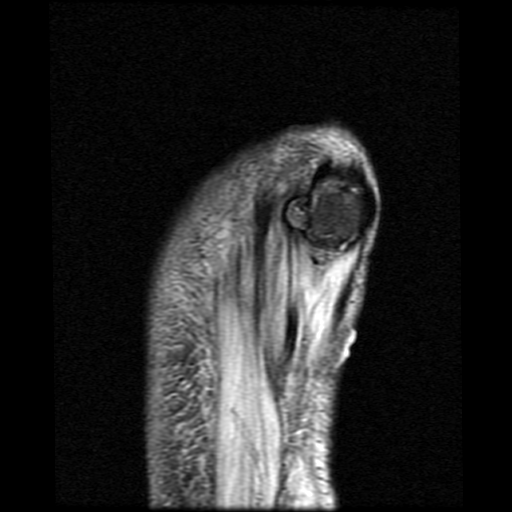
[im 11/18]
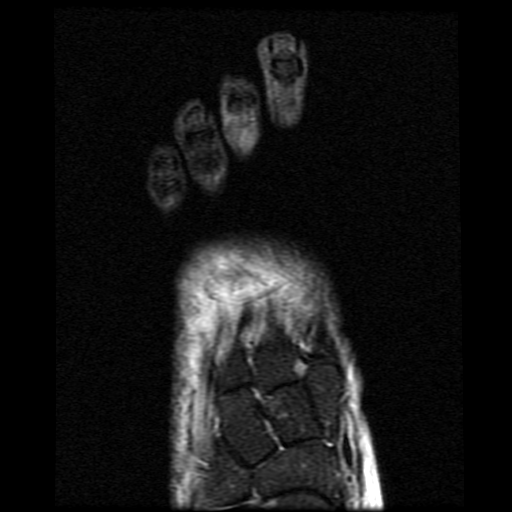
[im 18/18]
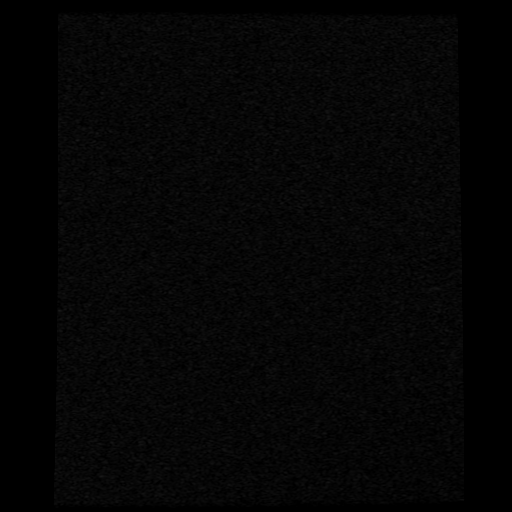

[19 of 40 positions shown; findings below may reference images not displayed]

FINDINGS: There is no osteomyelitis or other significant osseous abnormality.
No soft tissue abscesses. No joint effusions. Tendons appear normal.
There is a blister on the plantar aspect of the forefoot. There is a
tiny area of There is probably there of ulcerations seen on image 12
series 8 at the level of the head of the first metatarsal. No
underlying abscess.

Nonspecific subcutaneous edema of the forefoot.
IMPRESSION: 1. Soft tissue ulceration and prominent blister on the plantar
aspect of the ball of the foot.
2. No underlying abscess, osteomyelitis, or joint infection.

## 2015-11-02 IMAGING — CR DG CHEST 2V
2 series · 2 of 2 positions shown · non-contrast
Comparison: 06/25/2013

CLINICAL DATA: Cough, diabetes, hypertension, hypercholesterolemia,
former smoker

EXAM:
CHEST  2 VIEW

[chest pa]
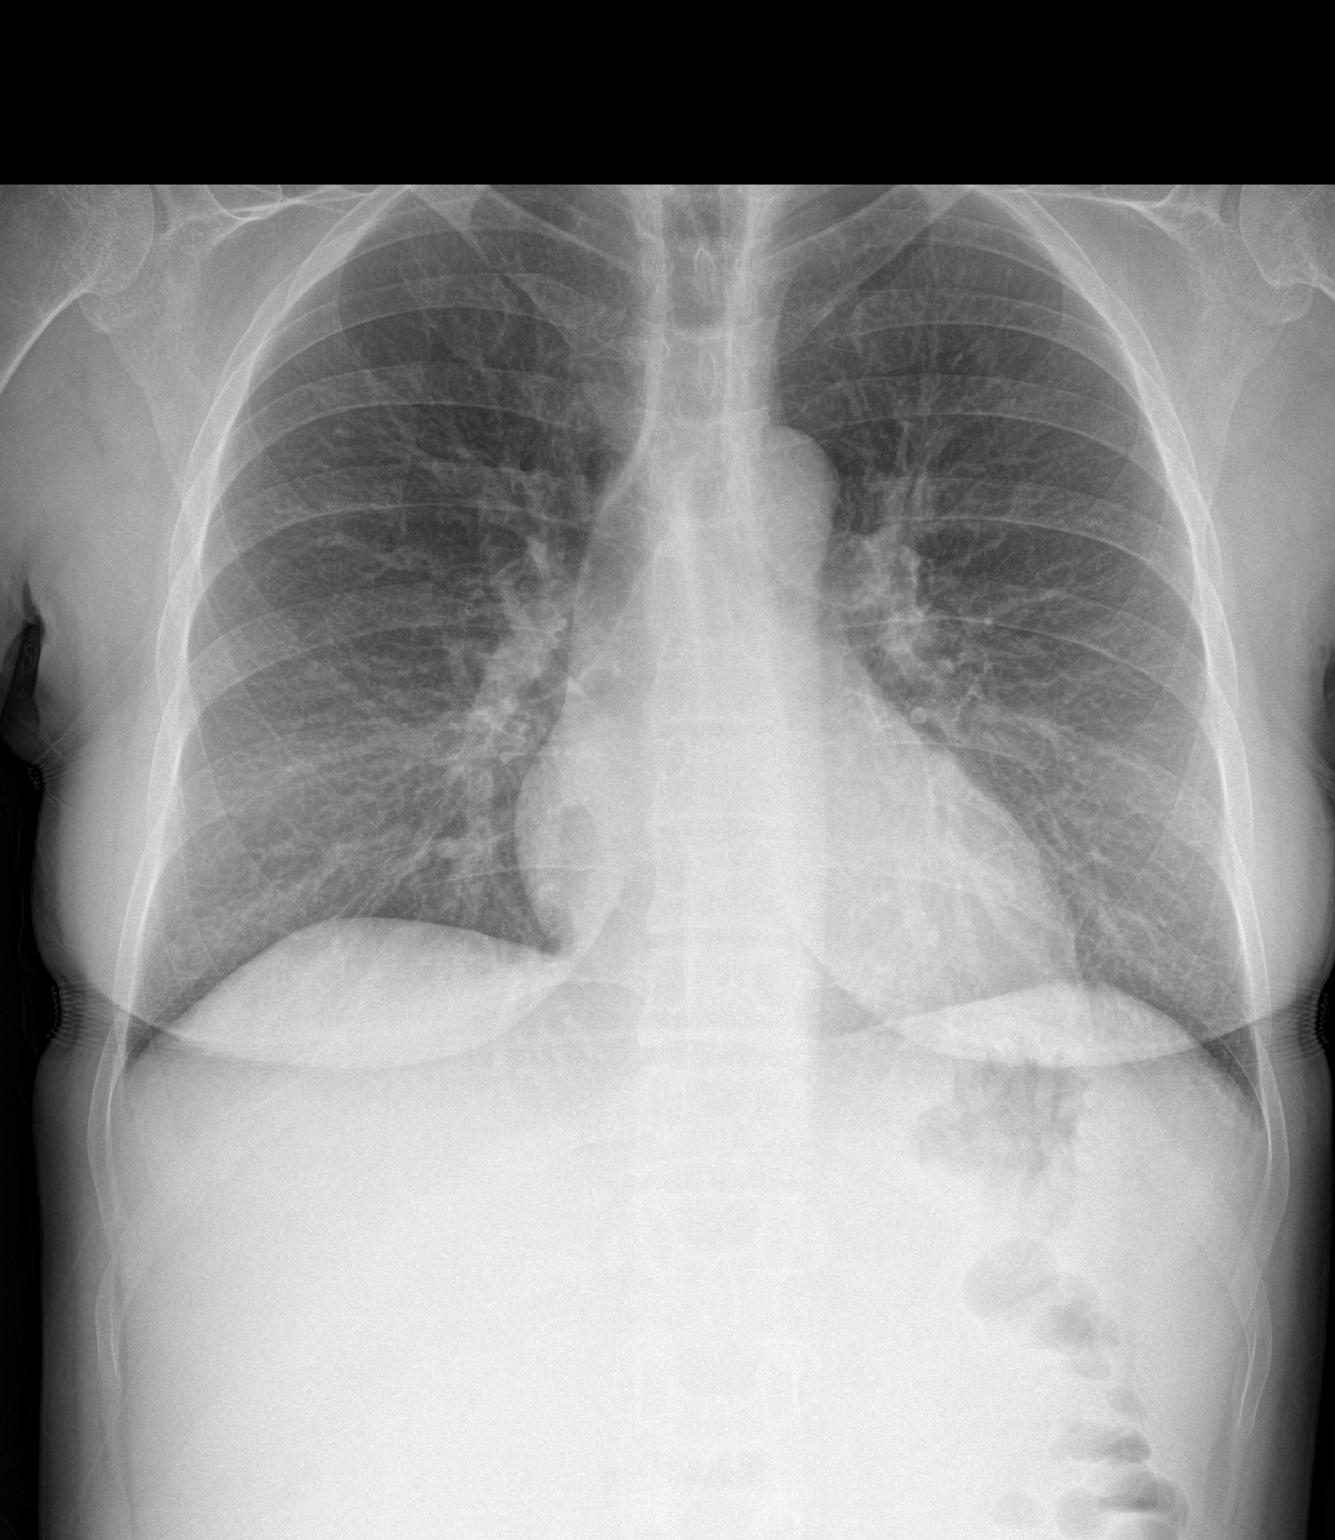

[chest lat]
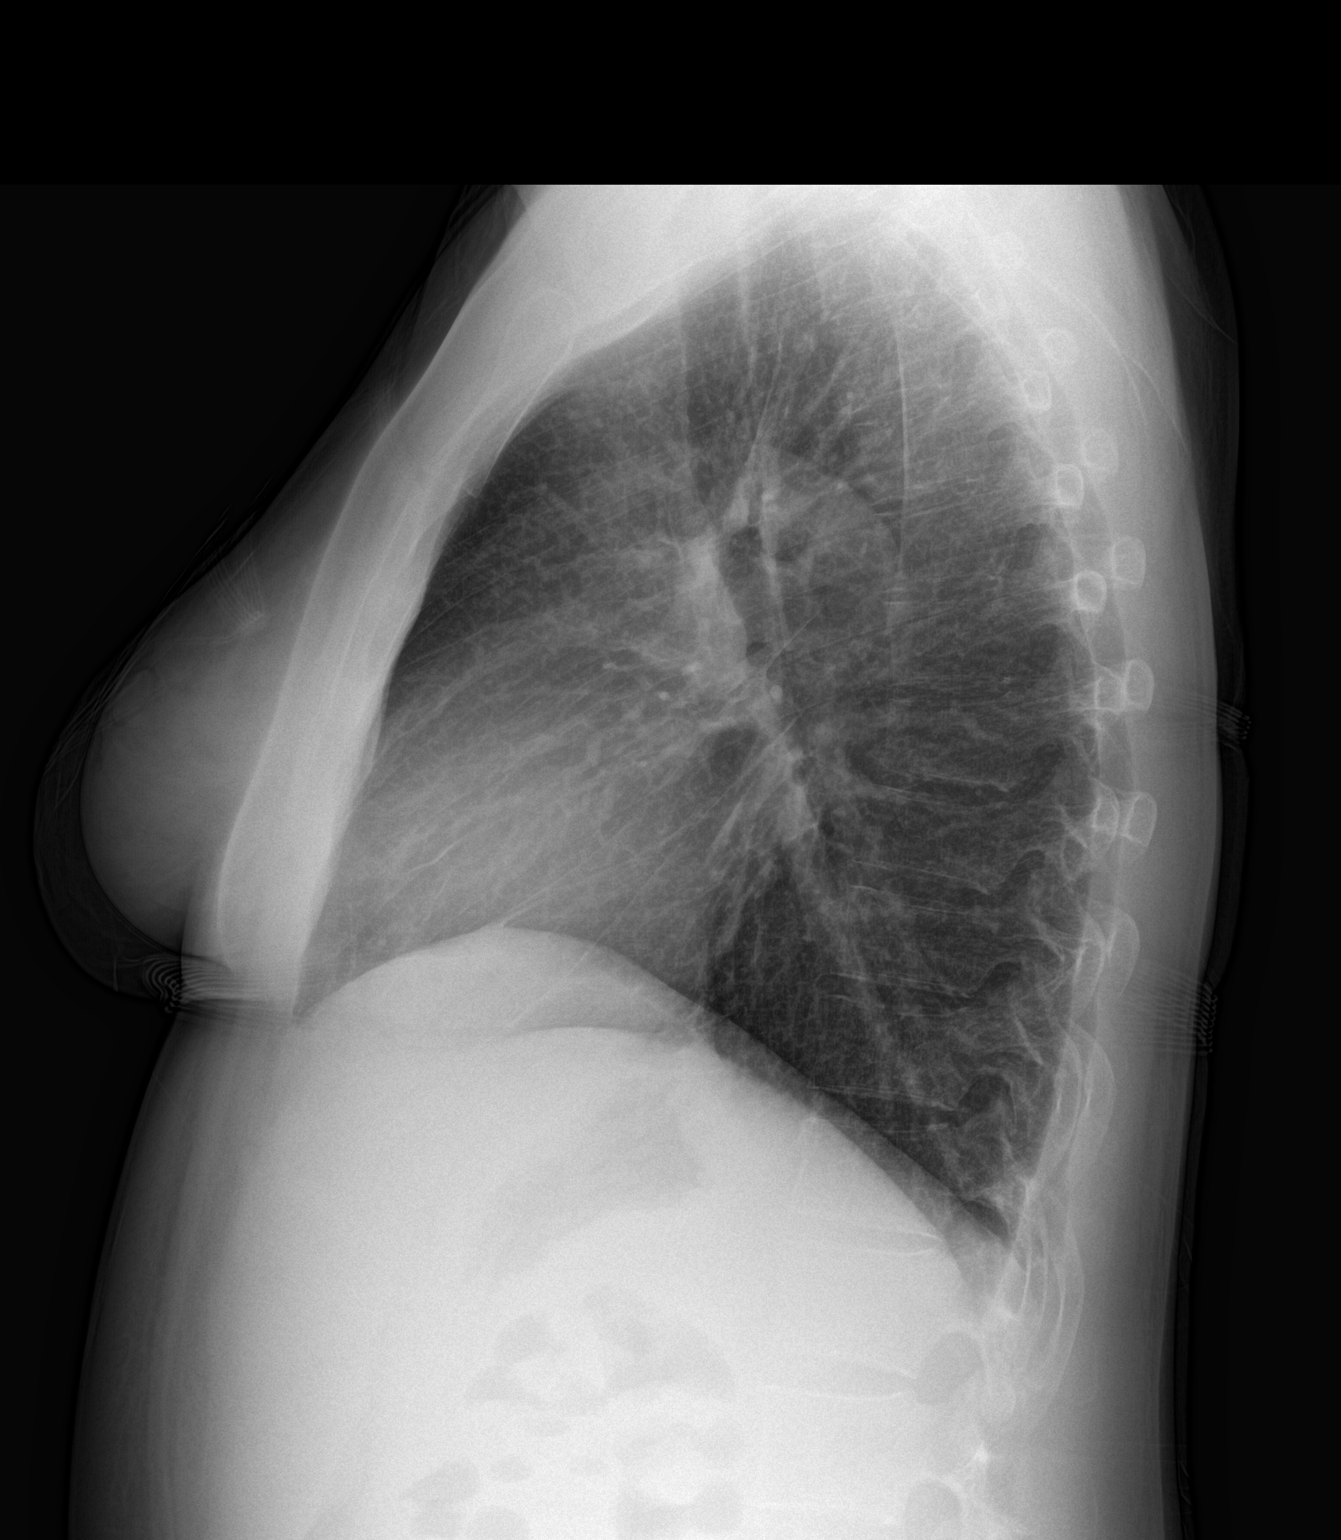

[2 of 2 positions shown; findings below may reference images not displayed]

FINDINGS: Upper normal heart size.

Mediastinal contours and pulmonary vascularity normal.

Minimal chronic peribronchial thickening without infiltrate, pleural
effusion or pneumothorax.

Bones demineralized.
IMPRESSION: Minimal chronic bronchitic changes without acute infiltrate.

## 2015-11-03 ENCOUNTER — Inpatient Hospital Stay (HOSPITAL_BASED_OUTPATIENT_CLINIC_OR_DEPARTMENT_OTHER)
Admission: EM | Admit: 2015-11-03 | Discharge: 2015-11-08 | DRG: 638 | Disposition: A | Discharge status: Home Health | Payer: Medicare Other | Attending: Internal Medicine | Admitting: Internal Medicine

## 2015-11-03 ENCOUNTER — Encounter (HOSPITAL_BASED_OUTPATIENT_CLINIC_OR_DEPARTMENT_OTHER): Payer: Self-pay | Admitting: *Deleted

## 2015-11-03 DIAGNOSIS — Z7901 Long term (current) use of anticoagulants: Secondary | ICD-10-CM

## 2015-11-03 DIAGNOSIS — E7800 Pure hypercholesterolemia, unspecified: Secondary | ICD-10-CM | POA: Diagnosis present

## 2015-11-03 DIAGNOSIS — L03116 Cellulitis of left lower limb: Secondary | ICD-10-CM | POA: Diagnosis present

## 2015-11-03 DIAGNOSIS — B9562 Methicillin resistant Staphylococcus aureus infection as the cause of diseases classified elsewhere: Secondary | ICD-10-CM | POA: Diagnosis present

## 2015-11-03 DIAGNOSIS — Z86718 Personal history of other venous thrombosis and embolism: Secondary | ICD-10-CM

## 2015-11-03 DIAGNOSIS — R011 Cardiac murmur, unspecified: Secondary | ICD-10-CM | POA: Diagnosis present

## 2015-11-03 DIAGNOSIS — Z89431 Acquired absence of right foot: Secondary | ICD-10-CM

## 2015-11-03 DIAGNOSIS — Z794 Long term (current) use of insulin: Secondary | ICD-10-CM | POA: Diagnosis not present

## 2015-11-03 DIAGNOSIS — E10628 Type 1 diabetes mellitus with other skin complications: Principal | ICD-10-CM | POA: Diagnosis present

## 2015-11-03 DIAGNOSIS — L0390 Cellulitis, unspecified: Secondary | ICD-10-CM

## 2015-11-03 DIAGNOSIS — Z6820 Body mass index (BMI) 20.0-20.9, adult: Secondary | ICD-10-CM

## 2015-11-03 DIAGNOSIS — E1065 Type 1 diabetes mellitus with hyperglycemia: Secondary | ICD-10-CM | POA: Diagnosis present

## 2015-11-03 DIAGNOSIS — N183 Chronic kidney disease, stage 3 (moderate): Secondary | ICD-10-CM | POA: Diagnosis present

## 2015-11-03 DIAGNOSIS — I129 Hypertensive chronic kidney disease with stage 1 through stage 4 chronic kidney disease, or unspecified chronic kidney disease: Secondary | ICD-10-CM | POA: Diagnosis present

## 2015-11-03 DIAGNOSIS — L02821 Furuncle of head [any part, except face]: Secondary | ICD-10-CM | POA: Diagnosis present

## 2015-11-03 DIAGNOSIS — Z66 Do not resuscitate: Secondary | ICD-10-CM | POA: Diagnosis present

## 2015-11-03 DIAGNOSIS — Z8614 Personal history of Methicillin resistant Staphylococcus aureus infection: Secondary | ICD-10-CM

## 2015-11-03 DIAGNOSIS — E10621 Type 1 diabetes mellitus with foot ulcer: Secondary | ICD-10-CM | POA: Diagnosis present

## 2015-11-03 DIAGNOSIS — N179 Acute kidney failure, unspecified: Secondary | ICD-10-CM | POA: Diagnosis present

## 2015-11-03 DIAGNOSIS — M79672 Pain in left foot: Secondary | ICD-10-CM | POA: Diagnosis present

## 2015-11-03 DIAGNOSIS — L97529 Non-pressure chronic ulcer of other part of left foot with unspecified severity: Secondary | ICD-10-CM | POA: Diagnosis present

## 2015-11-03 DIAGNOSIS — A4102 Sepsis due to Methicillin resistant Staphylococcus aureus: Secondary | ICD-10-CM

## 2015-11-03 DIAGNOSIS — L739 Follicular disorder, unspecified: Secondary | ICD-10-CM | POA: Diagnosis present

## 2015-11-03 DIAGNOSIS — E876 Hypokalemia: Secondary | ICD-10-CM | POA: Diagnosis present

## 2015-11-03 DIAGNOSIS — E10610 Type 1 diabetes mellitus with diabetic neuropathic arthropathy: Secondary | ICD-10-CM | POA: Diagnosis present

## 2015-11-03 DIAGNOSIS — L02612 Cutaneous abscess of left foot: Secondary | ICD-10-CM | POA: Diagnosis present

## 2015-11-03 DIAGNOSIS — E1040 Type 1 diabetes mellitus with diabetic neuropathy, unspecified: Secondary | ICD-10-CM | POA: Diagnosis present

## 2015-11-03 DIAGNOSIS — L0291 Cutaneous abscess, unspecified: Secondary | ICD-10-CM

## 2015-11-03 DIAGNOSIS — M869 Osteomyelitis, unspecified: Secondary | ICD-10-CM

## 2015-11-03 DIAGNOSIS — Z9641 Presence of insulin pump (external) (internal): Secondary | ICD-10-CM | POA: Diagnosis present

## 2015-11-03 DIAGNOSIS — E875 Hyperkalemia: Secondary | ICD-10-CM | POA: Diagnosis present

## 2015-11-03 DIAGNOSIS — N189 Chronic kidney disease, unspecified: Secondary | ICD-10-CM

## 2015-11-03 DIAGNOSIS — Z79899 Other long term (current) drug therapy: Secondary | ICD-10-CM

## 2015-11-03 DIAGNOSIS — I10 Essential (primary) hypertension: Secondary | ICD-10-CM | POA: Diagnosis present

## 2015-11-03 DIAGNOSIS — E440 Moderate protein-calorie malnutrition: Secondary | ICD-10-CM | POA: Diagnosis present

## 2015-11-03 DIAGNOSIS — E871 Hypo-osmolality and hyponatremia: Secondary | ICD-10-CM | POA: Diagnosis present

## 2015-11-03 DIAGNOSIS — E11610 Type 2 diabetes mellitus with diabetic neuropathic arthropathy: Secondary | ICD-10-CM | POA: Diagnosis present

## 2015-11-03 DIAGNOSIS — E1022 Type 1 diabetes mellitus with diabetic chronic kidney disease: Secondary | ICD-10-CM | POA: Diagnosis present

## 2015-11-03 DIAGNOSIS — L0202 Furuncle of face: Secondary | ICD-10-CM | POA: Diagnosis present

## 2015-11-03 DIAGNOSIS — Z792 Long term (current) use of antibiotics: Secondary | ICD-10-CM

## 2015-11-03 DIAGNOSIS — L089 Local infection of the skin and subcutaneous tissue, unspecified: Secondary | ICD-10-CM

## 2015-11-03 DIAGNOSIS — L02229 Furuncle of trunk, unspecified: Secondary | ICD-10-CM | POA: Diagnosis present

## 2015-11-03 DIAGNOSIS — D649 Anemia, unspecified: Secondary | ICD-10-CM | POA: Diagnosis present

## 2015-11-03 DIAGNOSIS — Z89432 Acquired absence of left foot: Secondary | ICD-10-CM

## 2015-11-03 DIAGNOSIS — L97509 Non-pressure chronic ulcer of other part of unspecified foot with unspecified severity: Secondary | ICD-10-CM

## 2015-11-03 DIAGNOSIS — E13621 Other specified diabetes mellitus with foot ulcer: Secondary | ICD-10-CM | POA: Diagnosis present

## 2015-11-03 DIAGNOSIS — L738 Other specified follicular disorders: Secondary | ICD-10-CM | POA: Diagnosis present

## 2015-11-03 DIAGNOSIS — Z87891 Personal history of nicotine dependence: Secondary | ICD-10-CM

## 2015-11-03 DIAGNOSIS — L02831 Carbuncle of head [any part, except face]: Secondary | ICD-10-CM | POA: Diagnosis not present

## 2015-11-03 DIAGNOSIS — E11628 Type 2 diabetes mellitus with other skin complications: Secondary | ICD-10-CM | POA: Diagnosis present

## 2015-11-03 HISTORY — DX: Type 2 diabetes mellitus with foot ulcer: E11.621

## 2015-11-03 HISTORY — DX: Non-pressure chronic ulcer of other part of right foot with unspecified severity: L97.519

## 2015-11-03 HISTORY — DX: Cellulitis of right lower limb: L03.115

## 2015-11-03 HISTORY — DX: Type 1 diabetes mellitus without complications: E10.9

## 2015-11-03 HISTORY — DX: Enterocolitis due to Clostridium difficile, not specified as recurrent: A04.72

## 2015-11-03 HISTORY — DX: Unspecified cataract: H26.9

## 2015-11-03 HISTORY — DX: Acute embolism and thrombosis of unspecified deep veins of unspecified lower extremity: I82.409

## 2015-11-03 HISTORY — DX: Chronic kidney disease, stage 3 unspecified: N18.30

## 2015-11-03 HISTORY — DX: Bacteremia: R78.81

## 2015-11-03 HISTORY — DX: Chronic kidney disease, stage 3 (moderate): N18.3

## 2015-11-03 LAB — CBC WITH DIFFERENTIAL/PLATELET
Basophils Absolute: 0 10*3/uL (ref 0.0–0.1)
Basophils Relative: 0 %
EOS ABS: 0.1 10*3/uL (ref 0.0–0.7)
EOS PCT: 2 %
HCT: 32.9 % — ABNORMAL LOW (ref 36.0–46.0)
Hemoglobin: 11.1 g/dL — ABNORMAL LOW (ref 12.0–15.0)
Lymphocytes Relative: 16 %
Lymphs Abs: 1.1 10*3/uL (ref 0.7–4.0)
MCH: 27.5 pg (ref 26.0–34.0)
MCHC: 33.7 g/dL (ref 30.0–36.0)
MCV: 81.6 fL (ref 78.0–100.0)
Monocytes Absolute: 0.7 10*3/uL (ref 0.1–1.0)
Monocytes Relative: 11 %
Neutro Abs: 4.8 10*3/uL (ref 1.7–7.7)
Neutrophils Relative %: 71 %
PLATELETS: 457 10*3/uL — AB (ref 150–400)
RBC: 4.03 MIL/uL (ref 3.87–5.11)
RDW: 14.9 % (ref 11.5–15.5)
WBC: 6.8 10*3/uL (ref 4.0–10.5)

## 2015-11-03 LAB — BASIC METABOLIC PANEL
Anion gap: 11 (ref 5–15)
BUN: 59 mg/dL — AB (ref 6–20)
CALCIUM: 8 mg/dL — AB (ref 8.9–10.3)
CO2: 12 mmol/L — ABNORMAL LOW (ref 22–32)
CREATININE: 3.06 mg/dL — AB (ref 0.44–1.00)
Chloride: 107 mmol/L (ref 101–111)
GFR calc Af Amer: 20 mL/min — ABNORMAL LOW (ref >60)
GFR, EST NON AFRICAN AMERICAN: 18 mL/min — AB (ref >60)
Glucose, Bld: 332 mg/dL — ABNORMAL HIGH (ref 65–99)
Potassium: 5.5 mmol/L — ABNORMAL HIGH (ref 3.5–5.1)
SODIUM: 130 mmol/L — AB (ref 135–145)

## 2015-11-03 LAB — SEDIMENTATION RATE: SED RATE: 61 mm/h — AB (ref 0–22)

## 2015-11-03 LAB — C-REACTIVE PROTEIN: CRP: 2.6 mg/dL — AB (ref <1.0)

## 2015-11-03 LAB — GLUCOSE, CAPILLARY: Glucose-Capillary: 118 mg/dL — ABNORMAL HIGH (ref 65–99)

## 2015-11-03 MED ORDER — VITAMIN B-6 100 MG PO TABS
100.0000 mg | ORAL_TABLET | Freq: Every day | ORAL | Status: DC
  Administered 2015-11-04 – 2015-11-08 (×5): 100 mg via ORAL
  Filled 2015-11-03 (×5): qty 1

## 2015-11-03 MED ORDER — SODIUM POLYSTYRENE SULFONATE 15 GM/60ML PO SUSP
30.0000 g | Freq: Once | ORAL | Status: AC
  Administered 2015-11-03: 30 g via ORAL
  Filled 2015-11-03: qty 120

## 2015-11-03 MED ORDER — INSULIN PUMP
SUBCUTANEOUS | Status: DC
  Administered 2015-11-04 – 2015-11-07 (×3): via SUBCUTANEOUS
  Filled 2015-11-03: qty 1

## 2015-11-03 MED ORDER — CALCITRIOL 0.25 MCG PO CAPS
0.2500 ug | ORAL_CAPSULE | ORAL | Status: DC
Start: 2015-11-04 — End: 2015-11-08
  Administered 2015-11-04 – 2015-11-07 (×2): 0.25 ug via ORAL
  Filled 2015-11-03 (×2): qty 1

## 2015-11-03 MED ORDER — ONDANSETRON HCL 4 MG PO TABS: 4.0000 mg | ORAL_TABLET | Freq: Four times a day (QID) | ORAL | Status: DC | PRN

## 2015-11-03 MED ORDER — FOLIC ACID 1 MG PO TABS
1.0000 mg | ORAL_TABLET | Freq: Every day | ORAL | Status: DC
  Administered 2015-11-04 – 2015-11-08 (×5): 1 mg via ORAL
  Filled 2015-11-03 (×5): qty 1

## 2015-11-03 MED ORDER — DOCUSATE SODIUM 100 MG PO CAPS
100.0000 mg | ORAL_CAPSULE | Freq: Two times a day (BID) | ORAL | Status: DC
  Administered 2015-11-06: 100 mg via ORAL
  Filled 2015-11-03 (×9): qty 1

## 2015-11-03 MED ORDER — FERROUS SULFATE 325 (65 FE) MG PO TABS
325.0000 mg | ORAL_TABLET | Freq: Three times a day (TID) | ORAL | Status: DC
  Administered 2015-11-04 – 2015-11-08 (×13): 325 mg via ORAL
  Filled 2015-11-03 (×12): qty 1

## 2015-11-03 MED ORDER — VANCOMYCIN HCL IN DEXTROSE 1-5 GM/200ML-% IV SOLN: 1000.0000 mg | Freq: Once | INTRAVENOUS | Status: DC

## 2015-11-03 MED ORDER — ONDANSETRON HCL 4 MG/2ML IJ SOLN: 4.0000 mg | Freq: Four times a day (QID) | INTRAMUSCULAR | Status: DC | PRN

## 2015-11-03 MED ORDER — ZOLPIDEM TARTRATE 5 MG PO TABS: 5.0000 mg | ORAL_TABLET | Freq: Every evening | ORAL | Status: DC | PRN

## 2015-11-03 MED ORDER — ACETAMINOPHEN 650 MG RE SUPP: 650.0000 mg | Freq: Four times a day (QID) | RECTAL | Status: DC | PRN

## 2015-11-03 MED ORDER — PRAVASTATIN SODIUM 20 MG PO TABS: 40.0000 mg | ORAL_TABLET | Freq: Every day | ORAL | Status: DC

## 2015-11-03 MED ORDER — SACCHAROMYCES BOULARDII 250 MG PO CAPS
250.0000 mg | ORAL_CAPSULE | Freq: Three times a day (TID) | ORAL | Status: DC
  Administered 2015-11-03 – 2015-11-08 (×14): 250 mg via ORAL
  Filled 2015-11-03 (×14): qty 1

## 2015-11-03 MED ORDER — VITAMIN B-12 100 MCG PO TABS
200.0000 ug | ORAL_TABLET | Freq: Every day | ORAL | Status: DC
  Administered 2015-11-04 – 2015-11-08 (×5): 200 ug via ORAL
  Filled 2015-11-03 (×5): qty 2

## 2015-11-03 MED ORDER — INSULIN ASPART 100 UNIT/ML ~~LOC~~ SOLN: 0.0000 [IU] | SUBCUTANEOUS | Status: DC

## 2015-11-03 MED ORDER — LACTATED RINGERS IV SOLN: INTRAVENOUS | Status: DC

## 2015-11-03 MED ORDER — SODIUM CHLORIDE 0.9 % IV SOLN
INTRAVENOUS | Status: DC
  Administered 2015-11-03 – 2015-11-06 (×4): via INTRAVENOUS

## 2015-11-03 MED ORDER — PRAVASTATIN SODIUM 20 MG PO TABS
20.0000 mg | ORAL_TABLET | Freq: Every day | ORAL | Status: DC
  Administered 2015-11-04 – 2015-11-07 (×4): 20 mg via ORAL
  Filled 2015-11-03 (×4): qty 1

## 2015-11-03 MED ORDER — ENOXAPARIN SODIUM 30 MG/0.3ML ~~LOC~~ SOLN
30.0000 mg | SUBCUTANEOUS | Status: DC
  Administered 2015-11-03 – 2015-11-06 (×4): 30 mg via SUBCUTANEOUS
  Filled 2015-11-03 (×4): qty 0.3

## 2015-11-03 MED ORDER — VANCOMYCIN HCL 500 MG IV SOLR
INTRAVENOUS | Status: AC
  Administered 2015-11-03: 1180 mg
  Filled 2015-11-03: qty 1500

## 2015-11-03 MED ORDER — PIPERACILLIN-TAZOBACTAM IN DEX 2-0.25 GM/50ML IV SOLN
2.2500 g | Freq: Four times a day (QID) | INTRAVENOUS | Status: DC
  Administered 2015-11-03 – 2015-11-04 (×2): 2.25 g via INTRAVENOUS
  Filled 2015-11-03 (×3): qty 50

## 2015-11-03 MED ORDER — GABAPENTIN 100 MG PO CAPS
100.0000 mg | ORAL_CAPSULE | Freq: Three times a day (TID) | ORAL | Status: DC
  Administered 2015-11-03 – 2015-11-08 (×14): 100 mg via ORAL
  Filled 2015-11-03 (×14): qty 1

## 2015-11-03 MED ORDER — ACETAMINOPHEN 325 MG PO TABS: 650.0000 mg | ORAL_TABLET | Freq: Four times a day (QID) | ORAL | Status: DC | PRN

## 2015-11-03 MED ORDER — SODIUM CHLORIDE 0.9 % IV BOLUS (SEPSIS)
1000.0000 mL | Freq: Once | INTRAVENOUS | Status: AC
  Administered 2015-11-03: 1000 mL via INTRAVENOUS

## 2015-11-03 MED ORDER — VANCOMYCIN HCL 500 MG IV SOLR: 500.0000 mg | INTRAVENOUS | Status: DC

## 2015-11-03 MED ORDER — VANCOMYCIN HCL 1000 MG IV SOLR
20.0000 mg/kg | Freq: Once | INTRAVENOUS | Status: AC
  Administered 2015-11-03: 1180 mg via INTRAVENOUS
  Filled 2015-11-03: qty 1180

## 2015-11-03 NOTE — ED Notes (Signed)
Attempted report to unit, RN is busy and will call back. Pt leaving with [facility] at this time.

## 2015-11-03 NOTE — ED Provider Notes (Signed)
[facility] DEPT [facility] Provider Note   CSN: [id number] Arrival date & time: 11/03/15  1426  First Provider Contact:  First MD Initiated Contact with Patient 11/03/15 1451        History   Chief Complaint Chief Complaint  Patient presents with  . Wound Infection    HPI [name] is a 45 y.o. female.  The history is provided by the patient.  Leg Pain   This is a recurrent problem. The current episode started 2 days ago. The problem occurs constantly. The problem has been rapidly worsening. The pain is present in the left foot. The quality of the pain is described as aching. The pain is moderate. The symptoms are aggravated by contact. Treatments tried: bactrim last 2 days. The treatment provided no relief.    Past Medical History:  Diagnosis Date  . Anemia   . Diabetes mellitus   . H/O seasonal allergies   . Hypercholesteremia   . Hypertension   . Left foot infection   . Neuropathy in diabetes [facility])     Patient Active Problem List   Diagnosis Date Noted  . Protein-calorie malnutrition ([place]) 10/18/2014  . Left leg DVT ([place]) 10/18/2014  . DVT (deep venous thrombosis) ([place])   . Staphylococcus aureus bacteremia   . Cellulitis of left foot 10/13/2014  . Type 1 diabetes mellitus with hyperglycemia ([place]) 10/13/2014  . Chronic anemia 10/13/2014  . Diabetic foot ulcer ([place]) 10/12/2014  . Hyperkalemia 10/06/2014  . Bilateral lower extremity edema 10/04/2014  . 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 Diabetic ulcer of left foot associated with type 1 diabetes mellitus ([place]) 09/26/2014  . C. difficile diarrhea 09/26/2014  . Sepsis ([place]) 09/01/2014  . Nausea and vomiting 09/01/2014  . Diarrhea 09/01/2014  . AKI (acute kidney injury) ([place]) 06/13/2014  . Cellulitis of right foot 06/02/2014  . Diabetic ulcer of right foot ([place]) 06/02/2014  . DM type 1 causing renal disease ([place]) 06/13/2013  . Osteomyelitis ([place]) 06/12/2013  . CKD  (chronic kidney disease), stage III 06/12/2013  . UTI (urinary tract infection) 03/25/2013  . CAP (community acquired pneumonia) 03/25/2013  . Acute blood loss anemia 03/25/2013  . Acute renal failure ([place]) 03/25/2013  . Acidosis 03/25/2013  . Leukocytosis, unspecified 03/25/2013  . DVT prophylaxis 03/25/2013  . Iron deficiency anemia 05/09/2011  . Charcot foot due to diabetes mellitus ([place]) 05/09/2011  . Foot ulcer due to secondary DM ([place]) 05/09/2011  . Type 1 diabetes mellitus with neurological manifestations, uncontrolled ([place])   . Hypertension   . Hypercholesteremia     Past Surgical History:  Procedure Laterality Date  . CESAREAN SECTION    . EYE SURGERY    . hemrrhoidectomy    . I&D EXTREMITY Right 06/10/2014   Procedure: IRRIGATION AND DEBRIDEMENT Right Foot;  Surgeon: Newt Minion, MD;  Location: [facility];  Service: Orthopedics;  Laterality: Right;  . PERIPHERALLY INSERTED CENTRAL CATHETER INSERTION    . SKIN SPLIT GRAFT Right 06/15/2014   Procedure: SPLIT THICKNESS SKIN GRAFT RIGHT FOOT;  Surgeon: Newt Minion, MD;  Location: [facility];  Service: Orthopedics;  Laterality: Right;    OB History    No data available       Home Medications    Prior to Admission medications   Medication Sig Start Date End Date Taking? Authorizing Provider  sulfamethoxazole-trimethoprim (BACTRIM DS,SEPTRA DS) 800-160 MG tablet Take 1 tablet by mouth 2 (two) times daily.   Yes Historical  Provider, MD  albuterol (PROVENTIL HFA;VENTOLIN HFA) 108 (90 BASE) MCG/ACT inhaler Inhale 2 puffs into the lungs every 6 (six) hours as needed. Reported on 04/15/2015    Historical Provider, MD  calcitRIOL (ROCALTROL) 0.25 MCG capsule Take 0.25 mcg by mouth every Monday, Wednesday, and Friday. Reported on 04/15/2015    Historical Provider, MD  ferrous sulfate 325 (65 FE) MG tablet Take 1 tablet (325 mg total) by mouth 3 (three) times daily with meals. Reported on 04/15/2015 04/18/15   Elayne Snare, MD  folic acid  (FOLVITE) 1 MG tablet Take 1 mg by mouth daily. Reported on 04/15/2015    Historical Provider, MD  furosemide (LASIX) 20 MG tablet Take 20 mg by mouth daily as needed for fluid. Reported on 04/15/2015    Historical Provider, MD  gabapentin (NEURONTIN) 100 MG capsule Take 1 capsule (100 mg total) by mouth 3 (three) times daily. 04/18/15   Elayne Snare, MD  Insulin Human (INSULIN PUMP) SOLN Inject into the skin. Reported on 04/15/2015    Historical Provider, MD  lactobacillus acidophilus (BACID) TABS tablet Take 1 tablet by mouth 3 (three) times daily. Reported on 04/15/2015    Historical Provider, MD  Multiple Minerals-Vitamins ([employer] CALCIUM 600 PLUS) CHEW Chew 1,200 mg by mouth daily. Reported on 04/15/2015    Historical Provider, MD  NOVOLOG 100 UNIT/ML injection USE MAX 65 UNITS PER DAY WITH PUMP 02/16/15   Elayne Snare, MD  pravastatin (PRAVACHOL) 40 MG tablet TAKE 1/2 TABLETS (20 MG TOTAL) BY MOUTH DAILY. Patient not taking: Reported on 04/15/2015 03/31/15   Elayne Snare, MD  pyridoxine (B-6) 100 MG tablet Take 100 mg by mouth daily. Reported on 04/15/2015    Historical Provider, MD  Rivaroxaban (XARELTO) 15 MG TABS tablet Take 1 tablet (15 mg total) by mouth 2 (two) times daily with a meal. Patient not taking: Reported on 04/15/2015 10/18/14   Nishant Dhungel, MD  vitamin B-12 (CYANOCOBALAMIN) 100 MCG tablet Take 200 mcg by mouth daily. Reported on 04/15/2015    Historical Provider, MD  Vitamin D, Ergocalciferol, (DRISDOL) 50000 units CAPS capsule Take 1 capsule (50,000 Units total) by mouth every 7 (seven) days. 07/18/15   Elayne Snare, MD    Family History Family History  Problem Relation Age of Onset  . Hyperlipidemia Mother     Social History Social History  Substance Use Topics  . Smoking status: Former Smoker    Quit date: 05/16/2008  . Smokeless tobacco: Never Used  . Alcohol use 0.5 oz/week    1 Standard drinks or equivalent per week     Comment: Occasional     Allergies   Lisinopril;  Ciprofloxacin; and Cleocin [clindamycin hcl]   Review of Systems Review of Systems  Constitutional: Negative for fever.  Musculoskeletal:       Leg swelling and pain with redness  Skin: Positive for color change.  All other systems reviewed and are negative.    Physical Exam Updated Vital Signs BP 120/67   Pulse 101   Temp 98.7 F (37.1 C)   Resp 18   Ht 5\' 7"  (1.702 m)   Wt 130 lb (59 kg)   SpO2 100%   BMI 20.36 kg/m   Physical Exam  Constitutional: She is oriented to person, place, and time. She appears well-developed and well-nourished. No distress.  HENT:  Head: Normocephalic.  Eyes: Conjunctivae are normal.  Neck: Neck supple. No tracheal deviation present.  Cardiovascular: Normal rate and regular rhythm.   Pulmonary/Chest: Effort normal.  No respiratory distress.  Abdominal: Soft. She exhibits no distension.  Feet:  Left Foot:  Skin Integrity: Positive for skin breakdown (with 2cm area of fluctuance proximal to wound), erythema and warmth.  Neurological: She is alert and oriented to person, place, and time.  Skin: Skin is warm and dry.  Psychiatric: She has a normal mood and affect.  Vitals reviewed.       ED Treatments / Results  Labs (all labs ordered are listed, but only abnormal results are displayed) Labs Reviewed  CBC WITH DIFFERENTIAL/PLATELET - Abnormal; Notable for the following:       Result Value   Hemoglobin 11.1 (*)    HCT 32.9 (*)    Platelets 457 (*)    All other components within normal limits  BASIC METABOLIC PANEL - Abnormal; Notable for the following:    Sodium 130 (*)    Potassium 5.5 (*)    CO2 12 (*)    Glucose, Bld 332 (*)    BUN 59 (*)    Creatinine, Ser 3.06 (*)    Calcium 8.0 (*)    GFR calc non Af Amer 18 (*)    GFR calc Af Amer 20 (*)    All other components within normal limits  SEDIMENTATION RATE - Abnormal; Notable for the following:    Sed Rate 61 (*)    All other components within normal limits  CULTURE,  BLOOD (ROUTINE X 2)  CULTURE, BLOOD (ROUTINE X 2)  C-REACTIVE PROTEIN    EKG  EKG Interpretation None       Radiology No results found.  Procedures Procedures (including critical care time)  Medications Ordered in ED Medications - No data to display   Initial Impression / Assessment and Plan / ED Course  I have reviewed the triage vital signs and the nursing notes.  Pertinent labs & imaging results that were available during my care of the patient were reviewed by me and considered in my medical decision making (see chart for details).  Clinical Course    45 year old female with history of type 1 diabetes, partial limitation of left foot who recently came home from a rehabilitation facility presents with increased redness and swelling of her left foot extending up her left calf. She has an area of fluctuance adjacent to her surgical wound. The toes were amputated by Dr. Neomia Dear, a podiatrist in the area who she states has since dismissed her from his practice.  Patient has had 2 days of Bactrim to help treat the infection and it appears to have progressed. At this point she would likely benefit from IV antibiotics and medical admission after failing outpatient therapy with possible inpatient MRI to further characterize the extent of the infection given her high risk demographics.  Hospitalist was consulted for admission and accepted the patient in transfer to facility capable of caring for patient further.   Final Clinical Impressions(s) / ED Diagnoses   Final diagnoses:  Cellulitis of left lower extremity  Abscess  AKI (acute kidney injury) [facility])    New Prescriptions New Prescriptions   No medications on file     Leo Grosser, MD 11/03/15 1746

## 2015-11-03 NOTE — Progress Notes (Signed)
Pharmacy Antibiotic Follow-up Note  [name] is a 45 y.o. year-old female admitted on 11/03/2015.  The patient is currently on day 1 of Vancomycin & Zosyn for diabetic foot cellulitis.  Assessment/Plan: Vancomycin 1180mg  x1 at [facility], then 500mg  IV every 24 hours.  Goal trough 10-15 mcg/mL.  Zosyn 2.25gm q6hr May need to aim for higher Vanc trough  Temp (24hrs), Avg:98.3 F (36.8 C), Min:97.8 F (36.6 C), Max:98.7 F (37.1 C)   Recent Labs Lab 11/03/15 1525  WBC 6.8    Recent Labs Lab 11/03/15 1525  CREATININE 3.06*   Estimated Creatinine Clearance: 22.1 mL/min (by C-G formula based on SCr of 3.06 mg/dL).    Allergies  Allergen Reactions  . Lisinopril Other (See Comments)    Elevated potassium per pt report  . Ciprofloxacin Itching  . Cleocin [Clindamycin Hcl]     Diarrhea    Antimicrobials this admission: 8/3 Vancomycin >>  8/3 Zosyn >>   Levels/dose changes this admission:  Microbiology results: 8/3 BCx: collected  Thank you for allowing pharmacy to be a part of this patient's care.  Minda Ditto PharmD Pager [phone] 11/03/2015, 9:11 PM

## 2015-11-03 NOTE — ED Notes (Signed)
MD at bedside. 

## 2015-11-03 NOTE — Progress Notes (Addendum)
BP 122/71   Pulse 85   Temp 98.7 F (37.1 C)   Resp 18   Ht 5\' 7"  (1.702 m)   Wt 59 kg (130 lb)   SpO2 100%   BMI 20.36 kg/m    45 y/o with PMH of Diabetes mellitus, with CKD III went [facility]tr[facility]Status post partial amputation on her left foot  Went to [facility] for erythema and redness of her left foot, she has reasonably been on Bactrimwith no improvement so she came into the ED.  She was found to have mild acute renal failure, the patient is on Lasix and she was started on IV fluids in the ED. An mild hyperkalemia EKG is pending at the time of this dictation and Kayexalate was given. She was admitted to [facility].

## 2015-11-03 NOTE — H&P (Signed)
History and Physical    [name] [id number] DOB: 1970/06/22 DOA: 11/03/2015  PCP:  Montine Circle, [address], [place], [place] Consultants:  Justin Mend - nephrology; Dwyane Dee - endocrinology Patient coming from: home - lives with 2 roommates  Chief Complaint: wound infection  HPI: [name] is a 45 y.o. female with medical history significant of type 1 DM with CKD and multiple foot infections s/p multiple amputations.  Had partial left foot amputation starting in February by former podiatrist, Dr. Neomia Dear.  Was discharged to rehab about 2 months ago on [facility].  Home health nursing q2 days, it was fine 2 days ago.  Over the last 2 days she has noticed increasing redness, drainage, and a blister that appears to contain pus.  Difficulty walking on it for the last 2 days as well.  Pressure from ankle to knee.  No fevers.  No other symptoms.  Podiatrist wanted to do foot amputation and she didn't want to do that; went to [place] for second opinion - Dr. Scharlene Corn.  He did not recommend amputation.  She has been discharged from former podiatrist's practice.  She is still in a CAM walker.   Of note, she also saw Dr. Sharol Given in the past and refuses to see him again.  She also developed a lesion on her face last Friday; doctor gave her some topical ointment.  Also has developed 2 lesions on scalp and one in groin.  Finally was given PO Vanc and Bactrim for these lesions 2 days ago.  She has a h/o Staph aureus bacteremia (MRSA) in 7/16.     ED Course: Presented to [facility] with increased redness and swelling of her left foot extending up her left calf with an area of fluctuance adjacent to prior surgical wound.  Failed 2 days of outpatient Bactrim, Vanc    Review of Systems: As per HPI; otherwise 10 point review of systems reviewed and negative.    Ambulatory Status:  Ambulates without assistance; currently hobbling around with boot   Past Medical History:  Diagnosis Date  . Anemia    . Cataracts, both eyes   . Cellulitis of right foot 06/02/2014  . CKD (chronic kidney disease) stage 3, GFR 30-59 ml/min   . DVT (deep venous thrombosis) ([place]) 10/2014  . H/O seasonal allergies   . Hypercholesteremia   . Hypertension   . Left foot infection   . Neuropathy in diabetes ([place])   . Staphylococcus aureus bacteremia 10/2014  . Type 1 diabetes ([place])    onset age 67    Past Surgical History:  Procedure Laterality Date  . CESAREAN SECTION    . EYE SURGERY    . FOOT AMPUTATION THROUGH METATARSAL Left   . hemrrhoidectomy    . I&D EXTREMITY Right 06/10/2014   Procedure: IRRIGATION AND DEBRIDEMENT Right Foot;  Surgeon: Newt Minion, MD;  Location: [facility];  Service: Orthopedics;  Laterality: Right;  . PERIPHERALLY INSERTED CENTRAL CATHETER INSERTION    . SKIN SPLIT GRAFT Right 06/15/2014   Procedure: SPLIT THICKNESS SKIN GRAFT RIGHT FOOT;  Surgeon: Newt Minion, MD;  Location: [facility];  Service: Orthopedics;  Laterality: Right;  . TRANSMETATARSAL AMPUTATION Right     Social History   Social History  . Marital status: Single    Spouse name: N/A  . Number of children: N/A  . Years of education: N/A   Occupational History  . disabled    Social History Main Topics  . Smoking status:  Former Smoker    Quit date: 05/16/2008  . Smokeless tobacco: Never Used  . Alcohol use 0.5 oz/week    1 Standard drinks or equivalent per week     Comment: Occasional  . Drug use: No  . Sexual activity: Yes   Other Topics Concern  . Not on file   Social History Narrative  . No narrative on file    Allergies  Allergen Reactions  . Lisinopril Other (See Comments)    Elevated potassium per pt report  . Ciprofloxacin Itching  . Cleocin [Clindamycin Hcl]     Diarrhea     Family History  Problem Relation Age of Onset  . Hyperlipidemia Mother   . Dementia Mother     Prior to Admission medications   Medication Sig Start Date End Date Taking? Authorizing Provider    sulfamethoxazole-trimethoprim (BACTRIM DS,SEPTRA DS) 800-160 MG tablet Take 1 tablet by mouth 2 (two) times daily.   Yes Historical Provider, MD  albuterol (PROVENTIL HFA;VENTOLIN HFA) 108 (90 BASE) MCG/ACT inhaler Inhale 2 puffs into the lungs every 6 (six) hours as needed. Reported on 04/15/2015    Historical Provider, MD  calcitRIOL (ROCALTROL) 0.25 MCG capsule Take 0.25 mcg by mouth every Monday, Wednesday, and Friday. Reported on 04/15/2015    Historical Provider, MD  ferrous sulfate 325 (65 FE) MG tablet Take 1 tablet (325 mg total) by mouth 3 (three) times daily with meals. Reported on 04/15/2015 04/18/15   Elayne Snare, MD  folic acid (FOLVITE) 1 MG tablet Take 1 mg by mouth daily. Reported on 04/15/2015    Historical Provider, MD  furosemide (LASIX) 20 MG tablet Take 20 mg by mouth daily as needed for fluid. Reported on 04/15/2015    Historical Provider, MD  gabapentin (NEURONTIN) 100 MG capsule Take 1 capsule (100 mg total) by mouth 3 (three) times daily. 04/18/15   Elayne Snare, MD  Insulin Human (INSULIN PUMP) SOLN Inject into the skin. Reported on 04/15/2015    Historical Provider, MD  lactobacillus acidophilus (BACID) TABS tablet Take 1 tablet by mouth 3 (three) times daily. Reported on 04/15/2015    Historical Provider, MD  Multiple Minerals-Vitamins ([employer] CALCIUM 600 PLUS) CHEW Chew 1,200 mg by mouth daily. Reported on 04/15/2015    Historical Provider, MD  NOVOLOG 100 UNIT/ML injection USE MAX 65 UNITS PER DAY WITH PUMP 02/16/15   Elayne Snare, MD  pravastatin (PRAVACHOL) 40 MG tablet TAKE 1/2 TABLETS (20 MG TOTAL) BY MOUTH DAILY. Patient not taking: Reported on 04/15/2015 03/31/15   Elayne Snare, MD  pyridoxine (B-6) 100 MG tablet Take 100 mg by mouth daily. Reported on 04/15/2015    Historical Provider, MD  Rivaroxaban (XARELTO) 15 MG TABS tablet Take 1 tablet (15 mg total) by mouth 2 (two) times daily with a meal. Patient not taking: Reported on 04/15/2015 10/18/14   Nishant Dhungel, MD  vitamin B-12  (CYANOCOBALAMIN) 100 MCG tablet Take 200 mcg by mouth daily. Reported on 04/15/2015    Historical Provider, MD  Vitamin D, Ergocalciferol, (DRISDOL) 50000 units CAPS capsule Take 1 capsule (50,000 Units total) by mouth every 7 (seven) days. 07/18/15   Elayne Snare, MD    Physical Exam: Vitals:   11/03/15 1700 11/03/15 1730 11/03/15 1800 11/03/15 1835  BP: 122/71 121/76 135/80 134/73  Pulse: 85 83 88 88  Resp:  14 23 18   Temp:    97.8 F (36.6 C)  TempSrc:    Oral  SpO2: 100% 100% 100% 100%  Weight:  Height:         General: Appears calm and comfortable and is NAD; was talking on the phone for the first 5-10 minutes I was in the room without even apparently noticing my presence Eyes: EOMI, normal lids, iris ENT:  grossly normal hearing, lips & tongue, mmm Neck:  no LAD, masses or thyromegaly Cardiovascular:  RRR, no r/g. Prominent 99991111 systolic murmur. No LE edema.  Respiratory:  CTA bilaterally, no w/r/r. Normal respiratory effort. Abdomen:  soft, ntnd, NABS Skin: 2-3 cm furuncle/boils located on L cheek, scalp, and mons pubis regions without obvious drainage, suspect MRSA folliculitis Musculoskeletal: s/p R transmetatarsal amputation which is well healed and without concern. L foot is s/p toes and 5th metatarsal amputation with erythema, purluent drainage adjacent to surgical wound which does not appear clean, and with fluid (pus?) filled bullous lesion.  Erythema extends up the calf. Psychiatric:  grossly normal mood and affect, speech fluent and appropriate, AOx3 Neurologic:  CN 2-12 grossly intact, moves all extremities in coordinated fashion, sensation impaired on LLE  Labs on Admission: I have personally reviewed following labs and imaging studies  CBC:  Recent Labs Lab 11/03/15 1525  WBC 6.8  NEUTROABS 4.8  HGB 11.1*  HCT 32.9*  MCV 81.6  PLT A999333*   Basic Metabolic Panel:  Recent Labs Lab 11/03/15 1525  NA 130*  K 5.5*  CL 107  CO2 12*  GLUCOSE 332*  BUN  59*  CREATININE 3.06*  CALCIUM 8.0*   GFR: Estimated Creatinine Clearance: 22.1 mL/min (by C-G formula based on SCr of 3.06 mg/dL). Liver Function Tests: No results for input(s): AST, ALT, ALKPHOS, BILITOT, PROT, ALBUMIN in the last 168 hours. No results for input(s): LIPASE, AMYLASE in the last 168 hours. No results for input(s): AMMONIA in the last 168 hours. Coagulation Profile: No results for input(s): INR, PROTIME in the last 168 hours. Cardiac Enzymes: No results for input(s): CKTOTAL, CKMB, CKMBINDEX, TROPONINI in the last 168 hours. BNP (last 3 results) No results for input(s): PROBNP in the last 8760 hours. HbA1C: No results for input(s): HGBA1C in the last 72 hours. CBG: No results for input(s): GLUCAP in the last 168 hours. Lipid Profile: No results for input(s): CHOL, HDL, LDLCALC, TRIG, CHOLHDL, LDLDIRECT in the last 72 hours. Thyroid Function Tests: No results for input(s): TSH, T4TOTAL, FREET4, T3FREE, THYROIDAB in the last 72 hours. Anemia Panel: No results for input(s): VITAMINB12, FOLATE, FERRITIN, TIBC, IRON, RETICCTPCT in the last 72 hours. Urine analysis:    Component Value Date/Time   COLORURINE YELLOW 01/26/2015 1200   APPEARANCEUR CLOUDY (A) 01/26/2015 1200   LABSPEC >=1.030 (A) 01/26/2015 1200   PHURINE 5.5 01/26/2015 1200   GLUCOSEU 500 (A) 01/26/2015 1200   HGBUR SMALL (A) 01/26/2015 1200   BILIRUBINUR NEGATIVE 01/26/2015 1200   KETONESUR NEGATIVE 01/26/2015 1200   PROTEINUR 100 (A) 09/01/2014 1259   UROBILINOGEN 0.2 01/26/2015 1200   NITRITE POSITIVE (A) 01/26/2015 1200   LEUKOCYTESUR NEGATIVE 01/26/2015 1200    Creatinine Clearance: Estimated Creatinine Clearance: 22.1 mL/min (by C-G formula based on SCr of 3.06 mg/dL).  Sepsis Labs: @LABRCNTIP (procalcitonin:4,lacticidven:4) ) Recent Results (from the past 240 hour(s))  Culture, blood (Routine X 2) w Reflex to ID Panel     Status: None (Preliminary result)   Collection Time: 11/03/15   3:25 PM  Result Value Ref Range Status   Specimen Description BLOOD RIGHT FOREARM  Final   Special Requests BOTTLES DRAWN AEROBIC AND ANAEROBIC [facility] EACH  Final  Culture PENDING  Incomplete   Report Status PENDING  Incomplete  Culture, blood (Routine X 2) w Reflex to ID Panel     Status: None (Preliminary result)   Collection Time: 11/03/15  3:35 PM  Result Value Ref Range Status   Specimen Description BLOOD LEFT FOREARM  Final   Special Requests BOTTLES DRAWN AEROBIC AND ANAEROBIC 5CC EACH  Final   Culture PENDING  Incomplete   Report Status PENDING  Incomplete     Radiological Exams on Admission: No results found.  EKG: Independently reviewed.  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 Sharol Given) who have recommended amptutation -Instead, she sought another opinion from a doctor in [place] (some distant familial connection led her there); she is convinced that if an amputation is recommended here, she will return to that doctor in [place] rather than to proceed with treatment here -Will proceed with Vanc/Zosyn therapy for now, pharmacy to dose -MRI W/WO contrast to further evaluate possible osteo -Based on MRI results, will likely need ortho  consult in AM  Folliculitis -Patient with h/o MRSA bacteremia, now with multiple lesions that appear clinically consistent with MRSA folliculitis -Gen Surg consulted, spoke with Dr. Johney Maine who will see patient tonight or in AM for consideration of I&D -He recommends ongoing abx therapy as ordered and improving glycemic control -With previously undocumented murmur (echo in 7/16 without mention), will request Echo and may need TEE  AKI on CKD -Prior diagnosis of CKD stage III -Prior creatinine 1.46 in 1/17, now 3.06 -Patient has been taking Lasix (?reason) -Will hold Lasix and gently hydrate (received 1L bolus, now 100 cc/hour) -Check BMP tomorrow  Hyponatremia -Likely secondary to volume deficiency, as above  Hyperkalemia -Treated with Kayexalate at [place] [facility] -Mild, no peaked T waves -No further treatment needed, anticipate improvement with hydration -Will follow  Type 1 DM with hyperglycemia  -CO2 is down but no anion gap -Patient has insulin pump and prefers to manage with this  -If unable, may need insulin drip as glucose control will help with infection control (and vice versa) -Diabetes consult requested  Chronic anemia -Appears stable -Will follow  DVT prophylaxis: h/o DVT but no longer needs anticoagulation; Lovenox for DVT prophylaxis only Code Status: DNR - confirmed with patient Family Communication: None present Disposition Plan: To be determined Consults called: Gen Surg, Diabetes coordinator; likely ortho in AM Admission status: Admit to Med Surg   Karmen Bongo MD [facility]  If 7PM-7AM, please contact night-coverage [email or web address] Password [facility]  11/03/2015, 8:41 PM

## 2015-11-03 NOTE — ED Triage Notes (Signed)
Pt sent here by wound care nurse for ? Infection to left foot , amputated toes x 4 months ago

## 2015-11-04 ENCOUNTER — Inpatient Hospital Stay (HOSPITAL_COMMUNITY): Payer: Medicare Other

## 2015-11-04 ENCOUNTER — Encounter (HOSPITAL_COMMUNITY): Payer: Self-pay | Admitting: Surgery

## 2015-11-04 DIAGNOSIS — A4102 Sepsis due to Methicillin resistant Staphylococcus aureus: Secondary | ICD-10-CM

## 2015-11-04 DIAGNOSIS — L738 Other specified follicular disorders: Secondary | ICD-10-CM

## 2015-11-04 DIAGNOSIS — N179 Acute kidney failure, unspecified: Secondary | ICD-10-CM

## 2015-11-04 DIAGNOSIS — D649 Anemia, unspecified: Secondary | ICD-10-CM

## 2015-11-04 DIAGNOSIS — R011 Cardiac murmur, unspecified: Secondary | ICD-10-CM

## 2015-11-04 DIAGNOSIS — E11610 Type 2 diabetes mellitus with diabetic neuropathic arthropathy: Secondary | ICD-10-CM

## 2015-11-04 DIAGNOSIS — L0202 Furuncle of face: Secondary | ICD-10-CM | POA: Diagnosis present

## 2015-11-04 DIAGNOSIS — L02831 Carbuncle of head [any part, except face]: Secondary | ICD-10-CM

## 2015-11-04 DIAGNOSIS — L0390 Cellulitis, unspecified: Secondary | ICD-10-CM

## 2015-11-04 DIAGNOSIS — B9562 Methicillin resistant Staphylococcus aureus infection as the cause of diseases classified elsewhere: Secondary | ICD-10-CM

## 2015-11-04 DIAGNOSIS — L02821 Furuncle of head [any part, except face]: Secondary | ICD-10-CM | POA: Diagnosis present

## 2015-11-04 DIAGNOSIS — L03116 Cellulitis of left lower limb: Secondary | ICD-10-CM

## 2015-11-04 DIAGNOSIS — L02229 Furuncle of trunk, unspecified: Secondary | ICD-10-CM

## 2015-11-04 DIAGNOSIS — N189 Chronic kidney disease, unspecified: Secondary | ICD-10-CM

## 2015-11-04 LAB — GLUCOSE, CAPILLARY
GLUCOSE-CAPILLARY: 288 mg/dL — AB (ref 65–99)
GLUCOSE-CAPILLARY: 246 mg/dL — AB (ref 65–99)
Glucose-Capillary: 123 mg/dL — ABNORMAL HIGH (ref 65–99)
Glucose-Capillary: 209 mg/dL — ABNORMAL HIGH (ref 65–99)
Glucose-Capillary: 300 mg/dL — ABNORMAL HIGH (ref 65–99)

## 2015-11-04 LAB — CBC
HCT: 30.7 % — ABNORMAL LOW (ref 36.0–46.0)
Hemoglobin: 10.2 g/dL — ABNORMAL LOW (ref 12.0–15.0)
MCH: 27.3 pg (ref 26.0–34.0)
MCHC: 33.2 g/dL (ref 30.0–36.0)
MCV: 82.3 fL (ref 78.0–100.0)
PLATELETS: 434 10*3/uL — AB (ref 150–400)
RBC: 3.73 MIL/uL — AB (ref 3.87–5.11)
RDW: 15.5 % (ref 11.5–15.5)
WBC: 4.9 10*3/uL (ref 4.0–10.5)

## 2015-11-04 LAB — HIV ANTIBODY (ROUTINE TESTING W REFLEX): HIV Screen 4th Generation wRfx: NONREACTIVE

## 2015-11-04 LAB — RAPID URINE DRUG SCREEN, HOSP PERFORMED
AMPHETAMINES: NOT DETECTED
BARBITURATES: NOT DETECTED
BENZODIAZEPINES: NOT DETECTED
COCAINE: NOT DETECTED
Opiates: NOT DETECTED
TETRAHYDROCANNABINOL: NOT DETECTED

## 2015-11-04 LAB — BASIC METABOLIC PANEL
Anion gap: 7 (ref 5–15)
BUN: 50 mg/dL — ABNORMAL HIGH (ref 6–20)
CALCIUM: 7.4 mg/dL — AB (ref 8.9–10.3)
CO2: 14 mmol/L — ABNORMAL LOW (ref 22–32)
CREATININE: 2.71 mg/dL — AB (ref 0.44–1.00)
Chloride: 119 mmol/L — ABNORMAL HIGH (ref 101–111)
GFR calc non Af Amer: 20 mL/min — ABNORMAL LOW (ref >60)
GFR, EST AFRICAN AMERICAN: 24 mL/min — AB (ref >60)
Glucose, Bld: 127 mg/dL — ABNORMAL HIGH (ref 65–99)
Potassium: 4.6 mmol/L (ref 3.5–5.1)
SODIUM: 140 mmol/L (ref 135–145)

## 2015-11-04 LAB — ECHOCARDIOGRAM COMPLETE
Height: 67 in
WEIGHTICAEL: 2080 [oz_av]

## 2015-11-04 MED ORDER — VANCOMYCIN HCL IN DEXTROSE 750-5 MG/150ML-% IV SOLN
750.0000 mg | INTRAVENOUS | Status: DC
  Administered 2015-11-04 – 2015-11-08 (×4): 750 mg via INTRAVENOUS
  Filled 2015-11-04 (×5): qty 150

## 2015-11-04 MED ORDER — PIPERACILLIN-TAZOBACTAM 3.375 G IVPB
3.3750 g | Freq: Three times a day (TID) | INTRAVENOUS | Status: DC
  Administered 2015-11-04 – 2015-11-08 (×13): 3.375 g via INTRAVENOUS
  Filled 2015-11-04 (×14): qty 50

## 2015-11-04 NOTE — Progress Notes (Signed)
Patient is refusing to have her AM CBG checked. She states that she is being "poked too much." Per night shift RN, the pump is off after patient reported giving herself 7 units of insulin last night and nursing asking patient to communicate with [place] on dosing herself beforehand. Patient has been rude to nursing and refusing care so far this morning.

## 2015-11-04 NOTE — Care Management Note (Signed)
Case Management Note  Patient Details  Name: [name] MRN: [id number] Date of Birth: 12-Dec-1970  Subjective/Objective: 45 y/o f admitted w/l foot cellulitis. From home, has pcp,pharmacy, 4 wheeled rw,3n1,shower chair. Active w/[facility]-HHRN/PT-rep Malachy Mood rep aware. Await HHC orders,[id number] to fax to [facility] @ d/c.                   Action/Plan:d/c plan home w/HHC.   Expected Discharge Date:                  Expected Discharge Plan:  [place]  In-House Referral:     Discharge planning Services  CM Consult  Post Acute Care Choice:  Home Health (Active w/[facility]-HHRN/PT) Choice offered to:     DME Arranged:    DME Agency:     HH Arranged:    HH Agency:  [facility]  Status of Service:  In process, will continue to follow  If discussed at Long Length of Stay Meetings, dates discussed:    Additional Comments:  Dessa Phi, RN 11/04/2015, 11:14 AM

## 2015-11-04 NOTE — Consult Note (Signed)
[place] Nurse wound consult note Reason for Consult: [place] Nurse and CCS consulted simultaneously for boils (3).  Dr. Johney Maine has seen this morning and believes the areas will resolve with system antibiotics.  Should patient go for another surgical procedure (i.e., foot) during this admission, the team may wish to consider I&D of the boils intraoperatively. Patient not seen today. Wound type: Infectious Pressure Ulcer POA: No WOC nursing team will not follow, but will remain available to this patient, the nursing, surgical and medical teams.  Please re-consult if needed. Thanks, Maudie Flakes, MSN, RN, [place], Ar[facility] Abbott  Pager# [phone]

## 2015-11-04 NOTE — Progress Notes (Signed)
  Echocardiogram 2D Echocardiogram has been performed.  Jennette Dubin 11/04/2015, 1:57 PM

## 2015-11-04 NOTE — Consult Note (Signed)
[name] [name], [name] [address] Phone: [phone] FAX: [place]  1970/12/27 [id number]  CARE TEAM:  PCP: Jackelyn Knife, MD  Outpatient Care Team: Patient Care Team: Jackelyn Knife, MD as PCP - General (Internal Medicine) Elayne Snare, MD as Consulting Physician (Endocrinology)  Inpatient Treatment Team: Treatment Team: Attending Provider: Verlee Monte, MD; Registered Nurse: Claris Pong, RN; Consulting Physician: Nolon Nations, MD; Technician: Wilder Glade, NT; Rounding Team: Erby Pian, RN; Rounding Team: Ian Bushman, MD  This patient is a 45 y.o.female who presents today for surgical evaluation at the request of Dr Lorin Mercy.   Reason for evaluation: Skin boils.  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  Struggled with many skin infections.  Often foot infections.  She has hed toe amputations in the past.  Followed by endocrinology with an insulin pump.  Some chronic kidney disease as well.  Followed by a podiatrist in town.  She had left foot surgery (5th metatarsal ray amputation?) and had some wound breakdown.  Got second opinion in [place].  Local podiatrist fired her from her practice.  I believe she has been seen by Dr. Sharol Given with orthopedic surgery in the past with concerns of need for toe/foot amputations, but patient refused.    The patient has been having having some developing worsening redness and pustules on her scalp, left cheek, and abdomen.  Also some swelling of the left foot.  She recently was in rehabilitation and transitioned to home health care.  Home health nurse noted worsening swelling of left foot.  Concern.  Sent to emergency room.  Admitted.  Surgical consultation requested to see if any of the boils need to be lanced.  Concern of osteomyelitis on left foot.  Past Medical History:  Diagnosis Date  . Anemia   . Cataracts, both eyes   .  Cellulitis of right foot 06/02/2014  . CKD (chronic kidney disease) stage 3, GFR 30-59 ml/min   . DVT (deep venous thrombosis) ([place]) 10/2014  . H/O seasonal allergies   . Hypercholesteremia   . Hypertension   . Left foot infection   . Neuropathy in diabetes ([place])   . Staphylococcus aureus bacteremia 10/2014  . Type 1 diabetes ([place])    onset age 79    Past Surgical History:  Procedure Laterality Date  . CESAREAN SECTION    . EYE SURGERY    . FOOT AMPUTATION THROUGH METATARSAL Left   . hemrrhoidectomy    . I&D EXTREMITY Right 06/10/2014   Procedure: IRRIGATION AND DEBRIDEMENT Right Foot;  Surgeon: Newt Minion, MD;  Location: [facility];  Service: Orthopedics;  Laterality: Right;  . PERIPHERALLY INSERTED CENTRAL CATHETER INSERTION    . SKIN SPLIT GRAFT Right 06/15/2014   Procedure: SPLIT THICKNESS SKIN GRAFT RIGHT FOOT;  Surgeon: Newt Minion, MD;  Location: [facility];  Service: Orthopedics;  Laterality: Right;  . TRANSMETATARSAL AMPUTATION Right     Social History   Social History  . Marital status: Single    Spouse name: N/A  . Number of children: N/A  . Years of education: N/A   Occupational History  . disabled    Social History Main Topics  . Smoking status: Former Smoker    Quit date: 05/16/2008  . Smokeless tobacco: Never Used  . Alcohol use 0.5 oz/week    1 Standard drinks or equivalent per week  Comment: Occasional  . Drug use: No  . Sexual activity: Yes   Other Topics Concern  . Not on file   Social History Narrative  . No narrative on file    Family History  Problem Relation Age of Onset  . Hyperlipidemia Mother   . Dementia Mother     Current Facility-Administered Medications  Medication Dose Route Frequency Provider Last Rate Last Dose  . 0.9 %  sodium chloride infusion   Intravenous Continuous Jonah Blue, MD 125 mL/hr at 11/04/15 0525    . acetaminophen (TYLENOL) tablet 650 mg  650 mg Oral Q6H PRN Jonah Blue, MD       Or  . acetaminophen  (TYLENOL) suppository 650 mg  650 mg Rectal Q6H PRN Jonah Blue, MD      . calcitRIOL (ROCALTROL) capsule 0.25 mcg  0.25 mcg Oral Q M,W,F Jonah Blue, MD      . docusate sodium (COLACE) capsule 100 mg  100 mg Oral BID Jonah Blue, MD      . enoxaparin (LOVENOX) injection 30 mg  30 mg Subcutaneous Q24H Jonah Blue, MD   30 mg at 11/03/15 2111  . ferrous sulfate tablet 325 mg  325 mg Oral TID WC Jonah Blue, MD      . folic acid (FOLVITE) tablet 1 mg  1 mg Oral Daily Jonah Blue, MD      . gabapentin (NEURONTIN) capsule 100 mg  100 mg Oral TID Jonah Blue, MD   100 mg at 11/03/15 2110  . insulin aspart (novoLOG) injection 0-9 Units  0-9 Units Subcutaneous Q4H Jonah Blue, MD      . insulin pump   Subcutaneous Q4H Jonah Blue, MD      . ondansetron [facility]) tablet 4 mg  4 mg Oral Q6H PRN Jonah Blue, MD       Or  . ondansetron [facility]) injection 4 mg  4 mg Intravenous Q6H PRN Jonah Blue, MD      . piperacillin-tazobactam (ZOSYN) IVPB 3.375 g  3.375 g Intravenous Q8H Aleda Grana, RPH      . pravastatin (PRAVACHOL) tablet 20 mg  20 mg Oral q1800 Marinda Elk, MD      . pyridOXINE (VITAMIN B-6) tablet 100 mg  100 mg Oral Daily Jonah Blue, MD      . saccharomyces boulardii [facility]) capsule 250 mg  250 mg Oral TID Jonah Blue, MD   250 mg at 11/03/15 2110  . vancomycin (VANCOCIN) IVPB 750 mg/150 ml premix  750 mg Intravenous Q24H Aleda Grana, RPH      . vitamin B-12 (CYANOCOBALAMIN) tablet 200 mcg  200 mcg Oral Daily Jonah Blue, MD      . zolpidem [facility]) tablet 5 mg  5 mg Oral QHS PRN Jonah Blue, MD         Allergies  Allergen Reactions  . Lisinopril Other (See Comments)    Elevated potassium per pt report  . Ciprofloxacin Itching  . Cleocin [Clindamycin Hcl]     Diarrhea     ROS: Constitutional:  No fevers, chills, sweats.  Weight stable Eyes:  No vision changes, No discharge HENT:  No sore throats, nasal  drainage Lymph: No neck swelling, No bruising easily Pulmonary:  No cough, productive sputum CV: No orthopnea, PND  No exertional chest/neck/shoulder/arm pain. GI:  No personal nor family history of GI/colon cancer, inflammatory bowel disease, irritable bowel syndrome, allergy such as Celiac Sprue, dietary/dairy problems, colitis, ulcers nor gastritis.  No recent  sick contacts/gastroenteritis.  No travel outside the country.  No changes in diet. Renal: No UTIs, No hematuria Genital:  No drainage, bleeding, masses Musculoskeletal: History of toe amputations.  Left foot pain and swelling.  No severe joint pain.  Otherwise decent ROM other major joints Skin:  No sores or lesions.  No rashes Heme/Lymph:  No easy bleeding.  No swollen lymph nodes Neuro: No focal weakness/numbness.  No seizures Psych: No suicidal ideation.  No hallucinations  BP 135/90 (BP Location: Right Arm)   Pulse 95   Temp 99.1 F (37.3 C) (Oral)   Resp 16   Ht 5\' 7"  (1.702 m)   Wt 59 kg (130 lb)   SpO2 99%   BMI 20.36 kg/m   Physical Exam: General: Pt awake/alert/oriented x4 in no major acute distress Eyes: PERRL, normal EOM. Sclera nonicteric Neuro: CN II-XII intact w/o focal sensory/motor deficits. Lymph: No head/neck/groin lymphadenopathy Psych:  No delerium/psychosis/paranoia.  Tearful, avoiding eye contact HENT: Normocephalic, Mucus membranes moist.  No thrush Neck: Supple, No tracheal deviation Chest: No pain.  Good respiratory excursion. CV:  Pulses intact.  Regular rhythm Abdomen: Soft, Nondistended.  Nontender.  No incarcerated hernias. Ext:  SCDs BLE.  No significant edema.  No cyanosis  Skin: No petechiae / purpurea.  She has superficial boils around 15 mm in size.  One on the vertex of her scalp.  Another along the left jaw of her cheek.  One at the mons pubis suprapubic region.    Musculoskeletal: Left foot with erythema and edema.  Lateral foot stellate scabs/wound.  5 x 5 cm area of fluctuance.   Tender.  S/p right transmetatarsal amputation.  No obvious problems with the rest of her joints.     Results:   Labs: Results for orders placed or performed during the hospital encounter of 11/03/15 (from the past 48 hour(s))  CBC with Differential/Platelet     Status: Abnormal   Collection Time: 11/03/15  3:25 PM  Result Value Ref Range   WBC 6.8 4.0 - 10.5 K/uL   RBC 4.03 3.87 - 5.11 MIL/uL   Hemoglobin 11.1 (L) 12.0 - 15.0 g/dL   HCT 01/03/16 (L) 41.8 - 30.4 %   MCV 81.6 78.0 - 100.0 fL   MCH 27.5 26.0 - 34.0 pg   MCHC 33.7 30.0 - 36.0 g/dL   RDW 30.6 31.7 - 92.1 %   Platelets 457 (H) 150 - 400 K/uL   Neutrophils Relative % 71 %   Neutro Abs 4.8 1.7 - 7.7 K/uL   Lymphocytes Relative 16 %   Lymphs Abs 1.1 0.7 - 4.0 K/uL   Monocytes Relative 11 %   Monocytes Absolute 0.7 0.1 - 1.0 K/uL   Eosinophils Relative 2 %   Eosinophils Absolute 0.1 0.0 - 0.7 K/uL   Basophils Relative 0 %   Basophils Absolute 0.0 0.0 - 0.1 K/uL  Basic metabolic panel     Status: Abnormal   Collection Time: 11/03/15  3:25 PM  Result Value Ref Range   Sodium 130 (L) 135 - 145 mmol/L   Potassium 5.5 (H) 3.5 - 5.1 mmol/L    Comment: NO VISIBLE HEMOLYSIS   Chloride 107 101 - 111 mmol/L   CO2 12 (L) 22 - 32 mmol/L   Glucose, Bld 332 (H) 65 - 99 mg/dL   BUN 59 (H) 6 - 20 mg/dL   Creatinine, Ser 01/03/16 (H) 0.44 - 1.00 mg/dL   Calcium 8.0 (L) 8.9 - 10.3 mg/dL   GFR  calc non Af Amer 18 (L) >60 mL/min   GFR calc Af Amer 20 (L) >60 mL/min    Comment: (NOTE) The eGFR has been calculated using the CKD EPI equation. This calculation has not been validated in all clinical situations. eGFR's persistently <60 mL/min signify possible Chronic Kidney Disease.    Anion gap 11 5 - 15  Sedimentation rate     Status: Abnormal   Collection Time: 11/03/15  3:25 PM  Result Value Ref Range   Sed Rate 61 (H) 0 - 22 mm/hr  C-reactive protein     Status: Abnormal   Collection Time: 11/03/15  3:25 PM  Result Value Ref Range    CRP 2.6 (H) <1.0 mg/dL    Comment: Performed at [facility]  Culture, blood (Routine X 2) w Reflex to ID Panel     Status: None (Preliminary result)   Collection Time: 11/03/15  3:25 PM  Result Value Ref Range   Specimen Description BLOOD RIGHT FOREARM    Special Requests BOTTLES DRAWN AEROBIC AND ANAEROBIC 5CC EACH    Culture PENDING    Report Status PENDING   Culture, blood (Routine X 2) w Reflex to ID Panel     Status: None (Preliminary result)   Collection Time: 11/03/15  3:35 PM  Result Value Ref Range   Specimen Description BLOOD LEFT FOREARM    Special Requests BOTTLES DRAWN AEROBIC AND ANAEROBIC 5CC EACH    Culture PENDING    Report Status PENDING   Glucose, capillary     Status: Abnormal   Collection Time: 11/03/15  8:51 PM  Result Value Ref Range   Glucose-Capillary 118 (H) 65 - 99 mg/dL  Glucose, capillary     Status: Abnormal   Collection Time: 11/04/15 12:29 AM  Result Value Ref Range   Glucose-Capillary 209 (H) 65 - 99 mg/dL  Basic metabolic panel     Status: Abnormal   Collection Time: 11/04/15  5:04 AM  Result Value Ref Range   Sodium 140 135 - 145 mmol/L   Potassium 4.6 3.5 - 5.1 mmol/L   Chloride 119 (H) 101 - 111 mmol/L   CO2 14 (L) 22 - 32 mmol/L   Glucose, Bld 127 (H) 65 - 99 mg/dL   BUN 50 (H) 6 - 20 mg/dL   Creatinine, Ser 2.71 (H) 0.44 - 1.00 mg/dL   Calcium 7.4 (L) 8.9 - 10.3 mg/dL   GFR calc non Af Amer 20 (L) >60 mL/min   GFR calc Af Amer 24 (L) >60 mL/min    Comment: (NOTE) The eGFR has been calculated using the CKD EPI equation. This calculation has not been validated in all clinical situations. eGFR's persistently <60 mL/min signify possible Chronic Kidney Disease.    Anion gap 7 5 - 15  CBC     Status: Abnormal   Collection Time: 11/04/15  5:04 AM  Result Value Ref Range   WBC 4.9 4.0 - 10.5 K/uL   RBC 3.73 (L) 3.87 - 5.11 MIL/uL   Hemoglobin 10.2 (L) 12.0 - 15.0 g/dL   HCT 30.7 (L) 36.0 - 46.0 %   MCV 82.3 78.0 - 100.0 fL    MCH 27.3 26.0 - 34.0 pg   MCHC 33.2 30.0 - 36.0 g/dL   RDW 15.5 11.5 - 15.5 %   Platelets 434 (H) 150 - 400 K/uL  Glucose, capillary     Status: Abnormal   Collection Time: 11/04/15  5:13 AM  Result Value Ref Range  Glucose-Capillary 123 (H) 65 - 99 mg/dL  Urine rapid drug screen (hosp performed)     Status: None   Collection Time: 11/04/15  5:41 AM  Result Value Ref Range   Opiates NONE DETECTED NONE DETECTED   Cocaine NONE DETECTED NONE DETECTED   Benzodiazepines NONE DETECTED NONE DETECTED   Amphetamines NONE DETECTED NONE DETECTED   Tetrahydrocannabinol NONE DETECTED NONE DETECTED   Barbiturates NONE DETECTED NONE DETECTED    Comment:        DRUG SCREEN FOR MEDICAL PURPOSES ONLY.  IF CONFIRMATION IS NEEDED FOR ANY PURPOSE, NOTIFY LAB WITHIN 5 DAYS.        LOWEST DETECTABLE LIMITS FOR URINE DRUG SCREEN Drug Class       Cutoff (ng/mL) Amphetamine      1000 Barbiturate      200 Benzodiazepine   782 Tricyclics       956 Opiates          300 Cocaine          300 THC              50     Imaging / Studies: No results found.  Medications / Allergies: per chart  Antibiotics: Anti-infectives    Start     Dose/Rate Route Frequency Ordered Stop   11/04/15 1600  vancomycin (VANCOCIN) 500 mg in sodium chloride 0.9 % 100 mL IVPB  Status:  Discontinued     500 mg 100 mL/hr over 60 Minutes Intravenous Every 24 hours 11/03/15 2107 11/04/15 0837   11/04/15 1600  vancomycin (VANCOCIN) IVPB 750 mg/150 ml premix     750 mg 150 mL/hr over 60 Minutes Intravenous Every 24 hours 11/04/15 0837     11/04/15 1400  piperacillin-tazobactam (ZOSYN) IVPB 3.375 g     3.375 g 12.5 mL/hr over 240 Minutes Intravenous Every 8 hours 11/04/15 0837     11/03/15 2200  piperacillin-tazobactam (ZOSYN) IVPB 2.25 g  Status:  Discontinued     2.25 g 100 mL/hr over 30 Minutes Intravenous Every 6 hours 11/03/15 2109 11/04/15 0837   11/03/15 2015  vancomycin (VANCOCIN) IVPB 1000 mg/200 mL premix   Status:  Discontinued     1,000 mg 200 mL/hr over 60 Minutes Intravenous  Once 11/03/15 2009 11/03/15 2014   11/03/15 1516  vancomycin (VANCOCIN) 500 MG powder    Comments:  Leak, Brandi   : cabinet override      11/03/15 1516 11/03/15 1539   11/03/15 1515  vancomycin (VANCOCIN) 1,180 mg in sodium chloride 0.9 % 100 mL IVPB     20 mg/kg  59 kg 100 mL/hr over 60 Minutes Intravenous  Once 11/03/15 1509 11/03/15 1631      Assessment  [name]  45 y.o. 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  Left  foot swelling and erythema and pus suspicious for deeper soft tissue or bone infection  Plan:  IV antibiotics.  Agree with vancomycin/Zosyn for broad coverage of skin flora.  Consider infectious disease consultation.  Suspect she will need prolonged IV antibiotics  I think these boils are small enough that they will stabilize & resolve with IV antibiotics only.  They are superficial and spontaneously draining.  Should she go to surgery for some other reason and they have not resolved by then, can consider incision and drainage.  We will try and hold off.  She does not have massive cellulitis associated with any of the three locations.  Agree with contact precautions.  Consider MRSA decolonization since she was positive last summer.  I think her major problem fraction is her  left foot.  I recommended orthopedic consultation.  Concerned that that left foot is nonsalvageable.  Dr. Lorin Mercy was planning MRI of the extremity to rule out osteomyelitis.  I think that is a good idea.  We will maintain peripherally available but will hold off on any more aggressive incision and drainage at this time.  Aggressive insulin diabetic control.  Monitor renal function.  -VTE prophylaxis- SCDs, etc  -mobilize as tolerated to help recovery.  Probably nonweightbearing on the left foot for now.  Orthopedic consultation  Patient understandably frustrated.  I agree with the plan to try and bring the infections under control.  Hopefully she will be compliant with our recommendations.   Adin Hector, M.D., F.A.C.S. Gastrointestinal and Minimally Invasive Surgery [facility] [address], [place], [place] [address] [phone] Main / Paging   11/04/2015  Note: Portions of this report may have been transcribed using voice recognition software. Every effort was made to ensure accuracy; however, inadvertent computerized transcription errors may be present.   Any transcriptional errors that result from this process are unintentional.

## 2015-11-04 NOTE — Progress Notes (Signed)
Inpatient Diabetes Program Recommendations  AACE/ADA: New Consensus Statement on Inpatient Glycemic Control (2015)  Target Ranges:  Prepandial:   less than 140 mg/dL      Peak postprandial:   less than 180 mg/dL (1-2 hours)      Critically ill patients:  140 - 180 mg/dL   Lab Results  Component Value Date   GLUCAP 300 (H) 11/04/2015   HGBA1C 9.1 (H) 04/15/2015    Review of Glycemic Control  Diabetes history: DM1 Outpatient Diabetes medications:  Insulin Pump - BASAL = Midnight = 0.3., 2 PM = 0.5, 5 PM = 0.6. Total basal:   Carb coverage 1: 18 g and correction 1: 60 with target 150, active insulin 4 hours Current orders for Inpatient glycemic control: Insulin Pump - same rates as above  Spoke with pt regarding her pump. States she changed her site on 8/3. Pt said she changes her site anywhere from 3-5 days, ususally around 5 days. Would not make eye contact with me at visit. States she is not going back to Dr. Dwyane Dee, wants to change doctors. When asked how she got her Humalog prescription, she said it was through an emergency fund at the drug store. Insurance would not cover the Novolog prescription and Dr. Ronnie Derby office would not change to Humalog without an OV. "I need someone to give me a Humalog prescription." Previous notes indicate pt is non-compliant with diet, bolusing for [facility] and states she doesn't know how to change her insulin pump settings. Inpatient Diabetes Program Recommendations:    Continue with insulin pump. If blood sugars > 200 mg/dL on a regular basis, would d/c insulin pump and begin Lantus and Novolog meal coverage and correction.  Will follow. Thank you. Lorenda Peck, RD, LDN, CDE Inpatient Diabetes Coordinator [phone]

## 2015-11-04 NOTE — Progress Notes (Signed)
PROGRESS NOTE  [name]  [id number] DOB: [birth year] DOA: 11/03/2015 PCP: Jackelyn Knife, MD Outpatient Specialists:  Subjective: Denies any new complaints, seen with Dr Johney Maine at bedside.  Brief Narrative:  [name] is a 45 y.o. female with medical history significant of type 1 DM with CKD and multiple foot infections s/p multiple amputations.  Had partial left foot amputation starting in February by former podiatrist, Dr. Neomia Dear.  Was discharged to rehab about 2 months ago on [facility].  Home health nursing q2 days, it was fine 2 days ago.  Over the last 2 days she has noticed increasing redness, drainage, and a blister that appears to contain pus.  Difficulty walking on it for the last 2 days as well.  Pressure from ankle to knee.  No fevers.  No other symptoms.  Podiatrist wanted to do foot amputation and she didn't want to do that; went to [place] for second opinion - Dr. Scharlene Corn.  He did not recommend amputation.  She has been discharged from former podiatrist's practice.  She is still in a CAM walker.   Of note, she also saw Dr. Sharol Given in the past and refuses to see him again. She also developed a lesion on her face last Friday; doctor gave her some topical ointment.  Also has developed 2 lesions on scalp and one in groin.  Finally was given PO Vanc and Bactrim for these lesions 2 days ago.  She has a h/o Staph aureus bacteremia (MRSA) in 7/16.  Assessment & Plan:   Principal Problem:   Cellulitis of left foot Active Problems:   Hypertension   Hypercholesteremia   Charcot foot due to diabetes mellitus ([place])   Foot ulcer due to secondary DM ([place])   Acute kidney injury superimposed on chronic kidney disease (HCC)   Status post transmetatarsal amputation of right foot (HCC)   Hyperkalemia   Type 1 diabetes mellitus with hyperglycemia (HCC)   Chronic anemia   Protein-calorie malnutrition, moderate (HCC)   Hyponatremia   Bacterial folliculitis  Heart murmur   Boil of scalp   Boil of left cheek   Boil of lower abdomen   MRSA cellulitis   Cellulitis of left foot -Currently with fairly obvious superficial infection of the left foot -However, significant concern for deeper infection -By patient's description, this has been ongoing since February (2016?) and there have been at least 2 doctors (podiatry and Dr. Sharol Given) who have recommended amptutation -Instead, she sought another opinion from a doctor in [place] (some distant familial connection led her there); she is convinced that if an amputation is recommended here, she will return to that doctor in [place] rather than to proceed with treatment here -Will proceed with Vanc/Zosyn therapy for now, pharmacy to dose -MRI W/WO contrast to further evaluate possible osteo -Based on MRI results, will likely need ortho consult in AM  Folliculitis -Patient with h/o MRSA bacteremia, now with multiple lesions that appear clinically consistent with MRSA folliculitis -Gen Surg consulted, spoke with Dr. Johney Maine. -He recommends ongoing abx therapy as ordered and improving glycemic control. -With previously undocumented murmur (echo in 7/16 without mention), will request Echo and may need TEE  AKI on CKD -Prior diagnosis of CKD stage III. -Prior creatinine 1.46 in 1/17, now 3.06 -Patient has been taking Lasix (?reason) -Check BMP tomorrow  Hyponatremia -Likely secondary to volume deficiency, as above  Hyperkalemia -Treated with Kayexalate at [facility] -Mild, no peaked T waves -No further treatment needed, anticipate improvement with hydration -  Will follow  Type 1 DM with hyperglycemia  -CO2 is down but no anion gap -Patient has insulin pump and prefers to manage with this  -If unable, may need insulin drip as glucose control will help with infection control (and vice versa) -Diabetes consult requested  Chronic anemia -Appears stable -Will follow   DVT prophylaxis: SQ  Heparin Code Status: DNR Family Communication:  Disposition Plan:  Diet: Diet Carb Modified Fluid consistency: Thin; Room service appropriate? Yes  Consultants:   Gen Surgery  Procedures:  None  Antimicrobials:   Vanc and Zosyn   Objective: Vitals:   11/03/15 1800 11/03/15 1835 11/03/15 2205 11/04/15 0527  BP: 135/80 134/73 107/74 135/90  Pulse: 88 88 78 95  Resp: 23 18 16 16   Temp:  97.8 F (36.6 C) 98.9 F (37.2 C) 99.1 F (37.3 C)  TempSrc:  Oral Oral Oral  SpO2: 100% 100% 100% 99%  Weight:      Height:        Intake/Output Summary (Last 24 hours) at 11/04/15 1341 Last data filed at 11/04/15 0600  Gross per 24 hour  Intake          1464.17 ml  Output                0 ml  Net          1464.17 ml   Filed Weights   11/03/15 1433  Weight: 59 kg (130 lb)    Examination: General exam: Appears calm and comfortable  Respiratory system: Clear to auscultation. Respiratory effort normal. Cardiovascular system: S1 & S2 heard, RRR. No JVD, murmurs, rubs, gallops or clicks. No pedal edema. Gastrointestinal system: Abdomen is nondistended, soft and nontender. No organomegaly or masses felt. Normal bowel sounds heard. Central nervous system: Alert and oriented. No focal neurological deficits. Extremities: Symmetric 5 x 5 power. Skin: No rashes, lesions or ulcers Psychiatry: Judgement and insight appear normal. Mood & affect appropriate.   Data Reviewed: I have personally reviewed following labs and imaging studies  CBC:  Recent Labs Lab 11/03/15 1525 11/04/15 0504  WBC 6.8 4.9  NEUTROABS 4.8  --   HGB 11.1* 10.2*  HCT 32.9* 30.7*  MCV 81.6 82.3  PLT 457* [id number]*   Basic Metabolic Panel:  Recent Labs Lab 11/03/15 1525 11/04/15 0504  NA 130* 140  K 5.5* 4.6  CL 107 119*  CO2 12* 14*  GLUCOSE 332* 127*  BUN 59* 50*  CREATININE 3.06* 2.71*  CALCIUM 8.0* 7.4*   GFR: Estimated Creatinine Clearance: 24.9 mL/min (by C-G formula based on SCr of 2.71  mg/dL). Liver Function Tests: No results for input(s): AST, ALT, ALKPHOS, BILITOT, PROT, ALBUMIN in the last 168 hours. No results for input(s): LIPASE, AMYLASE in the last 168 hours. No results for input(s): AMMONIA in the last 168 hours. Coagulation Profile: No results for input(s): INR, PROTIME in the last 168 hours. Cardiac Enzymes: No results for input(s): CKTOTAL, CKMB, CKMBINDEX, TROPONINI in the last 168 hours. BNP (last 3 results) No results for input(s): PROBNP in the last 8760 hours. HbA1C: No results for input(s): HGBA1C in the last 72 hours. CBG:  Recent Labs Lab 11/03/15 2051 11/04/15 0029 11/04/15 0513 11/04/15 1127  GLUCAP 118* 209* 123* 300*   Lipid Profile: No results for input(s): CHOL, HDL, LDLCALC, TRIG, CHOLHDL, LDLDIRECT in the last 72 hours. Thyroid Function Tests: No results for input(s): TSH, T4TOTAL, FREET4, T3FREE, THYROIDAB in the last 72 hours. Anemia Panel: No results for input(s):  VITAMINB12, FOLATE, FERRITIN, TIBC, IRON, RETICCTPCT in the last 72 hours. Urine analysis:    Component Value Date/Time   COLORURINE YELLOW 01/26/2015 1200   APPEARANCEUR CLOUDY (A) 01/26/2015 1200   LABSPEC >=1.030 (A) 01/26/2015 1200   PHURINE 5.5 01/26/2015 1200   GLUCOSEU 500 (A) 01/26/2015 1200   HGBUR SMALL (A) 01/26/2015 1200   BILIRUBINUR NEGATIVE 01/26/2015 1200   KETONESUR NEGATIVE 01/26/2015 1200   PROTEINUR 100 (A) 09/01/2014 1259   UROBILINOGEN 0.2 01/26/2015 1200   NITRITE POSITIVE (A) 01/26/2015 1200   LEUKOCYTESUR NEGATIVE 01/26/2015 1200   Sepsis Labs: @LABRCNTIP (procalcitonin:4,lacticidven:4)  ) Recent Results (from the past 240 hour(s))  Culture, blood (Routine X 2) w Reflex to ID Panel     Status: None (Preliminary result)   Collection Time: 11/03/15  3:25 PM  Result Value Ref Range Status   Specimen Description BLOOD RIGHT FOREARM  Final   Special Requests BOTTLES DRAWN AEROBIC AND ANAEROBIC 5CC EACH  Final   Culture   Final    NO  GROWTH < 24 HOURS Performed at [facility]    Report Status PENDING  Incomplete  Culture, blood (Routine X 2) w Reflex to ID Panel     Status: None (Preliminary result)   Collection Time: 11/03/15  3:35 PM  Result Value Ref Range Status   Specimen Description BLOOD LEFT FOREARM  Final   Special Requests BOTTLES DRAWN AEROBIC AND ANAEROBIC 5CC EACH  Final   Culture   Final    NO GROWTH < 24 HOURS Performed at [facility]    Report Status PENDING  Incomplete     Invalid input(s): PROCALCITONIN, [place]   Radiology Studies: No results found.      Scheduled Meds: . calcitRIOL  0.25 mcg Oral Q M,W,F  . docusate sodium  100 mg Oral BID  . enoxaparin (LOVENOX) injection  30 mg Subcutaneous Q24H  . ferrous sulfate  325 mg Oral TID WC  . folic acid  1 mg Oral Daily  . gabapentin  100 mg Oral TID  . insulin aspart  0-9 Units Subcutaneous Q4H  . insulin pump   Subcutaneous Q4H  . piperacillin-tazobactam (ZOSYN)  IV  3.375 g Intravenous Q8H  . pravastatin  20 mg Oral q1800  . pyridoxine  100 mg Oral Daily  . saccharomyces boulardii  250 mg Oral TID  . vancomycin  750 mg Intravenous Q24H  . vitamin B-12  200 mcg Oral Daily   Continuous Infusions: . sodium chloride 125 mL/hr at 11/04/15 0525     LOS: 1 day    Time spent: 35 minutes    Ysabel Stankovich A, MD [facility] Pager [phone]  If 7PM-7AM, please contact night-coverage [email or web address] Password [email or web address] 11/04/2015, 1:41 PM

## 2015-11-04 NOTE — Progress Notes (Signed)
Patient refuses CBG checks and insulin administration during the night and lab draws tomorrow morning. Patient manages insulin pump.

## 2015-11-04 NOTE — Progress Notes (Addendum)
Pharmacy Antibiotic Follow-up Note  [name] is a 45 y.o. year-old female admitted on 11/03/2015.  The patient is currently on day 1 of Vancomycin & Zosyn for diabetic foot cellulitis. Also has "lesions" on face, scalp, and labia per H&P.  Placed on TMP/SMZ and PO vancomycin prior to admission to treat these "lesions."  PO vancomycin does not achieve any appreciable plasma concentrations, thus only used for localized infections of GI tract and should have not been used for this indication.   Today, 11/04/2015:  Renal: CKD, Scr better, Scr in Jan was ~1.5  WBC WNL  No fevers  MRI foot: pending  Assessment/Plan: For improved renal function:  Change vancomycin 750mg  IV q24h for goal trough 15-20 mcg/ml until osteomyelitis ruled out  Vancomycin trough based on length of therapy and renal function  Change zosyn to 3.375gm IV q8h over 4h infusion  Follow cultures, renal function and MRI results  Temp (24hrs), Avg:98.6 F (37 C), Min:97.8 F (36.6 C), Max:99.1 F (37.3 C)   Recent Labs Lab 11/03/15 1525 11/04/15 0504  WBC 6.8 4.9     Recent Labs Lab 11/03/15 1525 11/04/15 0504  CREATININE 3.06* 2.71*   Estimated Creatinine Clearance: 24.9 mL/min (by C-G formula based on SCr of 2.71 mg/dL).    Allergies  Allergen Reactions  . Lisinopril Other (See Comments)    Elevated potassium per pt report  . Ciprofloxacin Itching  . Cleocin [Clindamycin Hcl]     Diarrhea    Antimicrobials this admission: 8/3 Vancomycin >>  8/3 Zosyn >>   Levels/dose changes this admission: 8/4: change vanco to 750mg  q24h for improved renal fx, zosyn to [place]  Microbiology results: 8/3 BCx: pending 8/3 BCx (set #2): pending Thank you for allowing pharmacy to be a part of this patient's care.  Doreene Eland, PharmD, BCPS.   Pager: [phone] 11/04/2015 8:39 AM

## 2015-11-05 DIAGNOSIS — E875 Hyperkalemia: Secondary | ICD-10-CM

## 2015-11-05 DIAGNOSIS — B9562 Methicillin resistant Staphylococcus aureus infection as the cause of diseases classified elsewhere: Secondary | ICD-10-CM

## 2015-11-05 DIAGNOSIS — E7800 Pure hypercholesterolemia, unspecified: Secondary | ICD-10-CM

## 2015-11-05 DIAGNOSIS — E1065 Type 1 diabetes mellitus with hyperglycemia: Secondary | ICD-10-CM

## 2015-11-05 DIAGNOSIS — Z89431 Acquired absence of right foot: Secondary | ICD-10-CM

## 2015-11-05 DIAGNOSIS — L0390 Cellulitis, unspecified: Secondary | ICD-10-CM

## 2015-11-05 DIAGNOSIS — E871 Hypo-osmolality and hyponatremia: Secondary | ICD-10-CM

## 2015-11-05 DIAGNOSIS — R011 Cardiac murmur, unspecified: Secondary | ICD-10-CM

## 2015-11-05 LAB — GLUCOSE, CAPILLARY
GLUCOSE-CAPILLARY: 214 mg/dL — AB (ref 65–99)
Glucose-Capillary: 225 mg/dL — ABNORMAL HIGH (ref 65–99)
Glucose-Capillary: 333 mg/dL — ABNORMAL HIGH (ref 65–99)
Glucose-Capillary: 296 mg/dL — ABNORMAL HIGH (ref 65–99)

## 2015-11-05 LAB — HEMOGLOBIN A1C
HEMOGLOBIN A1C: 8.4 % — AB (ref 4.8–5.6)
MEAN PLASMA GLUCOSE: 194 mg/dL

## 2015-11-05 MED ORDER — INSULIN ASPART 100 UNIT/ML ~~LOC~~ SOLN: 0.0000 [IU] | Freq: Three times a day (TID) | SUBCUTANEOUS | Status: DC

## 2015-11-05 NOTE — Progress Notes (Signed)
Patient ID: [name], female   DOB: [birth year], 45 y.o.   MRN: [id number]  [facility]  Subjective: Patient in bed. Wants info from orthopedics about foot.  MRI results pending.  Complains about drainage from lesions on scalp and face.  Objective: Vital signs in last 24 hours: Temp:  [98.5 F (36.9 C)-99.4 F (37.4 C)] 98.5 F (36.9 C) (08/05 0549) Pulse Rate:  [75-85] 75 (08/05 0549) Resp:  [16-18] 16 (08/05 0549) BP: (117-144)/(54-68) 117/54 (08/05 0549) SpO2:  [98 %-100 %] 98 % (08/05 0549) Last BM Date: 11/04/15  Intake/Output from previous day: 08/04 0701 - 08/05 0700 In: 1347.5 [P.O.:720; I.V.:627.5] Out: -  Intake/Output this shift: No intake/output data recorded.  Physical Exam: HEENT - sclerae clear, mucous membranes moist; two small areas on scalp with erythema, induration, ulcerated, not fluctuent Face - left cheek with draining wound 1.5 cm, erythematous, indurated, ulcerated Abdomen - 2 cm ulcerated lesion RLQ, not fluctuent Neuro - alert & oriented  Lab Results:   Recent Labs  11/03/15 1525 11/04/15 0504  WBC 6.8 4.9  HGB 11.1* 10.2*  HCT 32.9* 30.7*  PLT 457* 434*   BMET  Recent Labs  11/03/15 1525 11/04/15 0504  NA 130* 140  K 5.5* 4.6  CL 107 119*  CO2 12* 14*  GLUCOSE 332* 127*  BUN 59* 50*  CREATININE 3.06* 2.71*  CALCIUM 8.0* 7.4*   PT/INR No results for input(s): LABPROT, INR in the last 72 hours. Comprehensive Metabolic Panel:    Component Value Date/Time   NA 140 11/04/2015 0504   NA 130 (L) 11/03/2015 1525   K 4.6 11/04/2015 0504   K 5.5 (H) 11/03/2015 1525   CL 119 (H) 11/04/2015 0504   CL 107 11/03/2015 1525   CO2 14 (L) 11/04/2015 0504   CO2 12 (L) 11/03/2015 1525   BUN 50 (H) 11/04/2015 0504   BUN 59 (H) 11/03/2015 1525   CREATININE 2.71 (H) 11/04/2015 0504   CREATININE 3.06 (H) 11/03/2015 1525   CREATININE 1.11 (H) 05/24/2011 1044   GLUCOSE 127 (H) 11/04/2015 0504   GLUCOSE 332 (H) 11/03/2015 1525   CALCIUM 7.4 (L) 11/04/2015 0504   CALCIUM 8.0 (L) 11/03/2015 1525   CALCIUM 8.5 09/01/2009 2241   AST 23 04/15/2015 1356   AST 32 01/26/2015 1200   ALT 32 04/15/2015 1356   ALT 48 (H) 01/26/2015 1200   ALKPHOS 191 (H) 04/15/2015 1356   ALKPHOS 181 (H) 01/26/2015 1200   BILITOT 0.2 04/15/2015 1356   BILITOT 0.2 01/26/2015 1200   PROT 6.9 04/15/2015 1356   PROT 6.9 01/26/2015 1200   ALBUMIN 3.1 (L) 04/15/2015 1356   ALBUMIN 3.1 (L) 01/26/2015 1200    Studies/Results: No results found.  Assessment & Plans: Multiple cutaneous abscesses, local cellulitis secondary to MRSA infection  Warm compresses to wounds as needed  IV Vanco, Zosyn  No role for surgical debridement at present  Orthopedics to evaluate left foot, review MRI  General surgery will follow up on Monday 8/7  Earnstine Regal, MD, [facility] Office: [place] 11/05/2015

## 2015-11-05 NOTE — Progress Notes (Signed)
Pt refused AM labs. [name], [employer]

## 2015-11-05 NOTE — Progress Notes (Signed)
PROGRESS NOTE  [name]  [id number] DOB: 1970-05-06 DOA: 11/03/2015 PCP: Jackelyn Knife, MD Outpatient Specialists:  Subjective: Feels OK no new complaints. Blood sugars been checked every 4 hours instead of qAC and HS  Brief Narrative:  [name] is a 45 y.o. female with medical history significant of type 1 DM with CKD and multiple foot infections s/p multiple amputations.  Had partial left foot amputation starting in February by former podiatrist, Dr. Neomia Dear.  Was discharged to rehab about 2 months ago on [facility].  Home health nursing q2 days, it was fine 2 days ago.  Over the last 2 days she has noticed increasing redness, drainage, and a blister that appears to contain pus.  Difficulty walking on it for the last 2 days as well.  Pressure from ankle to knee.  No fevers.  No other symptoms.  Podiatrist wanted to do foot amputation and she didn't want to do that; went to [place] for second opinion - Dr. Scharlene Corn.  He did not recommend amputation.  She has been discharged from former podiatrist's practice.  She is still in a CAM walker.   Of note, she also saw Dr. Sharol Given in the past and refuses to see him again. She also developed a lesion on her face last Friday; doctor gave her some topical ointment.  Also has developed 2 lesions on scalp and one in groin.  Finally was given PO Vanc and Bactrim for these lesions 2 days ago.  She has a h/o Staph aureus bacteremia (MRSA) in 7/16.  Assessment & Plan:   Principal Problem:   Cellulitis of left foot Active Problems:   Hypertension   Hypercholesteremia   Charcot foot due to diabetes mellitus ([place])   Foot ulcer due to secondary DM ([place])   Acute kidney injury superimposed on chronic kidney disease (HCC)   Status post transmetatarsal amputation of right foot (HCC)   Hyperkalemia   Type 1 diabetes mellitus with hyperglycemia (HCC)   Chronic anemia   Protein-calorie malnutrition, moderate (HCC)   DVT of  upper extremity (deep vein thrombosis) (HCC)   Hyponatremia   Bacterial folliculitis   Heart murmur   Boil of scalp   Boil of left cheek   Boil of lower abdomen   MRSA cellulitis   Cellulitis of left foot -Currently with fairly obvious superficial infection of the left foot -However, significant concern for deeper infection -By patient's description, this has been ongoing since February (2016?) and there have been at least 2 doctors (podiatry and Dr. Sharol Given) who have recommended amptutation -Instead, she sought another opinion from a doctor in [place] (some distant familial connection led her there); she is convinced that if an amputation is recommended here, she will return to that doctor in [place] rather than to proceed with treatment here -Will proceed with Vanc/Zosyn therapy for now, pharmacy to dose -MRI W/WO contrast to further evaluate possible osteo -MRI pending ??? Called radiology and they'll call me back.  Folliculitis -Patient with h/o MRSA bacteremia, now with multiple lesions that appear clinically consistent with MRSA folliculitis -Gen Surg consulted, spoke with Dr. Johney Maine. -He recommends ongoing abx therapy as ordered and improving glycemic control. -With previously undocumented murmur (echo in 7/16 without mention), echo did not show any findings suggest fragmentation. -Increase in the LVOT velocity could cause the systolic murmur heard over the aortic area.  AKI on CKD -Prior diagnosis of CKD stage III. -Prior creatinine 1.46 in 1/17, now 3.06 -Patient has been taking  Lasix (?reason) -Check BMP tomorrow  Hyponatremia -Likely secondary to volume deficiency, as above  Hyperkalemia -Treated with Kayexalate at [facility] -Mild, no peaked T waves -No further treatment needed, anticipate improvement with hydration -Will follow  Type 1 DM with hyperglycemia  -CO2 is down but no anion gap -Patient has insulin pump and prefers to manage with this  -If  unable, may need insulin drip as glucose control will help with infection control (and vice versa) -Diabetes consult requested  Chronic anemia -Appears stable -Will follow   DVT prophylaxis: SQ Heparin Code Status: DNR Family Communication:  Disposition Plan:  Diet: Diet Carb Modified Fluid consistency: Thin; Room service appropriate? Yes  Consultants:   Gen Surgery  Procedures:  None  Antimicrobials:   Vanc and Zosyn   Objective: Vitals:   11/04/15 0527 11/04/15 1506 11/04/15 2248 11/05/15 0549  BP: 135/90 (!) 127/56 (!) 144/68 (!) 117/54  Pulse: 95 85 85 75  Resp: 16 18 16 16   Temp: 99.1 F (37.3 C) 99 F (37.2 C) 99.4 F (37.4 C) 98.5 F (36.9 C)  TempSrc: Oral Oral Oral Oral  SpO2: 99% 98% 100% 98%  Weight:      Height:        Intake/Output Summary (Last 24 hours) at 11/05/15 1133 Last data filed at 11/05/15 [id number]  Gross per 24 hour  Intake           1347.5 ml  Output                0 ml  Net           1347.5 ml   Filed Weights   11/03/15 1433  Weight: 59 kg (130 lb)    Examination: General exam: Appears calm and comfortable  Respiratory system: Clear to auscultation. Respiratory effort normal. Cardiovascular system: S1 & S2 heard, RRR. No JVD, murmurs, rubs, gallops or clicks. No pedal edema. Gastrointestinal system: Abdomen is nondistended, soft and nontender. No organomegaly or masses felt. Normal bowel sounds heard. Central nervous system: Alert and oriented. No focal neurological deficits. Extremities: Symmetric 5 x 5 power. Skin: No rashes, lesions or ulcers Psychiatry: Judgement and insight appear normal. Mood & affect appropriate.   Data Reviewed: I have personally reviewed following labs and imaging studies  CBC:  Recent Labs Lab 11/03/15 1525 11/04/15 0504  WBC 6.8 4.9  NEUTROABS 4.8  --   HGB 11.1* 10.2*  HCT 32.9* 30.7*  MCV 81.6 82.3  PLT 457* [id number]*   Basic Metabolic Panel:  Recent Labs Lab 11/03/15 1525 11/04/15 0504   NA 130* 140  K 5.5* 4.6  CL 107 119*  CO2 12* 14*  GLUCOSE 332* 127*  BUN 59* 50*  CREATININE 3.06* 2.71*  CALCIUM 8.0* 7.4*   GFR: Estimated Creatinine Clearance: 24.9 mL/min (by C-G formula based on SCr of 2.71 mg/dL). Liver Function Tests: No results for input(s): AST, ALT, ALKPHOS, BILITOT, PROT, ALBUMIN in the last 168 hours. No results for input(s): LIPASE, AMYLASE in the last 168 hours. No results for input(s): AMMONIA in the last 168 hours. Coagulation Profile: No results for input(s): INR, PROTIME in the last 168 hours. Cardiac Enzymes: No results for input(s): CKTOTAL, CKMB, CKMBINDEX, TROPONINI in the last 168 hours. BNP (last 3 results) No results for input(s): PROBNP in the last 8760 hours. HbA1C:  Recent Labs  11/04/15 0504  HGBA1C 8.4*   CBG:  Recent Labs Lab 11/04/15 0513 11/04/15 1127 11/04/15 1615 11/04/15 2019 11/05/15 0818  GLUCAP 123*  300* 288* 246* 214*   Lipid Profile: No results for input(s): CHOL, HDL, LDLCALC, TRIG, CHOLHDL, LDLDIRECT in the last 72 hours. Thyroid Function Tests: No results for input(s): TSH, T4TOTAL, FREET4, T3FREE, THYROIDAB in the last 72 hours. Anemia Panel: No results for input(s): VITAMINB12, FOLATE, FERRITIN, TIBC, IRON, RETICCTPCT in the last 72 hours. Urine analysis:    Component Value Date/Time   COLORURINE YELLOW 01/26/2015 1200   APPEARANCEUR CLOUDY (A) 01/26/2015 1200   LABSPEC >=1.030 (A) 01/26/2015 1200   PHURINE 5.5 01/26/2015 1200   GLUCOSEU 500 (A) 01/26/2015 1200   HGBUR SMALL (A) 01/26/2015 1200   BILIRUBINUR NEGATIVE 01/26/2015 1200   KETONESUR NEGATIVE 01/26/2015 1200   PROTEINUR 100 (A) 09/01/2014 1259   UROBILINOGEN 0.2 01/26/2015 1200   NITRITE POSITIVE (A) 01/26/2015 1200   LEUKOCYTESUR NEGATIVE 01/26/2015 1200   Sepsis Labs: @LABRCNTIP (procalcitonin:4,lacticidven:4)  ) Recent Results (from the past 240 hour(s))  Culture, blood (Routine X 2) w Reflex to ID Panel     Status: None  (Preliminary result)   Collection Time: 11/03/15  3:25 PM  Result Value Ref Range Status   Specimen Description BLOOD RIGHT FOREARM  Final   Special Requests BOTTLES DRAWN AEROBIC AND ANAEROBIC 5CC EACH  Final   Culture   Final    NO GROWTH 1 DAY Performed at [facility]    Report Status PENDING  Incomplete  Culture, blood (Routine X 2) w Reflex to ID Panel     Status: None (Preliminary result)   Collection Time: 11/03/15  3:35 PM  Result Value Ref Range Status   Specimen Description BLOOD LEFT FOREARM  Final   Special Requests BOTTLES DRAWN AEROBIC AND ANAEROBIC 5CC EACH  Final   Culture   Final    NO GROWTH 1 DAY Performed at [facility]    Report Status PENDING  Incomplete  Culture, blood (routine x 2)     Status: None (Preliminary result)   Collection Time: 11/03/15  8:55 PM  Result Value Ref Range Status   Specimen Description BLOOD RIGHT HAND  Final   Special Requests IN PEDIATRIC BOTTLE [place]  Final   Culture   Final    NO GROWTH < 24 HOURS Performed at [facility]    Report Status PENDING  Incomplete  Culture, blood (routine x 2)     Status: None (Preliminary result)   Collection Time: 11/03/15  9:00 PM  Result Value Ref Range Status   Specimen Description BLOOD LEFT HAND  Final   Special Requests BOTTLES DRAWN AEROBIC ONLY 5CC  Final   Culture   Final    NO GROWTH < 24 HOURS Performed at [facility]    Report Status PENDING  Incomplete     Invalid input(s): PROCALCITONIN, [place]   Radiology Studies: No results found.      Scheduled Meds: . calcitRIOL  0.25 mcg Oral Q M,W,F  . docusate sodium  100 mg Oral BID  . enoxaparin (LOVENOX) injection  30 mg Subcutaneous Q24H  . ferrous sulfate  325 mg Oral TID WC  . folic acid  1 mg Oral Daily  . gabapentin  100 mg Oral TID  . insulin aspart  0-9 Units Subcutaneous Q4H  . insulin pump   Subcutaneous Q4H  . piperacillin-tazobactam (ZOSYN)  IV  3.375 g Intravenous Q8H  .  pravastatin  20 mg Oral q1800  . pyridoxine  100 mg Oral Daily  . saccharomyces boulardii  250 mg Oral TID  .  vancomycin  750 mg Intravenous Q24H  . vitamin B-12  200 mcg Oral Daily   Continuous Infusions: . sodium chloride 75 mL/hr at 11/05/15 0627     LOS: 2 days    Time spent: 35 minutes    Alphonso Gregson A, MD [facility] Pager [phone]  If 7PM-7AM, please contact night-coverage [email or web address] Password [facility] 11/05/2015, 11:33 AM

## 2015-11-06 DIAGNOSIS — I10 Essential (primary) hypertension: Secondary | ICD-10-CM

## 2015-11-06 DIAGNOSIS — L0203 Carbuncle of face: Secondary | ICD-10-CM

## 2015-11-06 DIAGNOSIS — L97509 Non-pressure chronic ulcer of other part of unspecified foot with unspecified severity: Secondary | ICD-10-CM

## 2015-11-06 DIAGNOSIS — E13621 Other specified diabetes mellitus with foot ulcer: Secondary | ICD-10-CM

## 2015-11-06 LAB — GLUCOSE, CAPILLARY
GLUCOSE-CAPILLARY: 178 mg/dL — AB (ref 65–99)
Glucose-Capillary: 220 mg/dL — ABNORMAL HIGH (ref 65–99)
Glucose-Capillary: 317 mg/dL — ABNORMAL HIGH (ref 65–99)
Glucose-Capillary: 135 mg/dL — ABNORMAL HIGH (ref 65–99)

## 2015-11-06 MED ORDER — HYDROMORPHONE HCL 1 MG/ML IJ SOLN
1.0000 mg | INTRAMUSCULAR | Status: DC | PRN
  Administered 2015-11-06: 1 mg via INTRAVENOUS
  Filled 2015-11-06: qty 1

## 2015-11-06 NOTE — Progress Notes (Signed)
Dr Lorin Mercy in to see pt & performed bedside I&D on left foot ulcer. Obtained very small amt puss. Culture sent to lab & wound dressed. Yumiko Alkins, [employer]

## 2015-11-06 NOTE — Consult Note (Signed)
Reason for Consult: Left foot staph infection Referring Physician: Hartford Poli MD  [name] is an 45 y.o. female.  HPI: 45 year old female with past history of multiple diabetic infections with staph bacteremia in the past she has the staff subcutaneous abscess on her cheek one are one on her head and has had previous foot surgeries toe amputation, ray amputation from infection. Patient presents with the pain swelling in her foot pain extensor heel an MRI which shows shows no evidence of deep bone abscess but questionable left subcutaneous abscess lateral aspect of her foot over the cuboid. Past Medical History:  Diagnosis Date  . Anemia   . C. difficile diarrhea 09/26/2014  . Cataracts, both eyes   . Cellulitis of right foot 06/02/2014  . CKD (chronic kidney disease) stage 3, GFR 30-59 ml/min   . Diabetic ulcer of right foot ([place]) 06/02/2014  . DVT (deep venous thrombosis) ([place]) 10/2014  . H/O seasonal allergies   . Hypercholesteremia   . Hypertension   . Left foot infection   . Neuropathy in diabetes ([place])   . Staphylococcus aureus bacteremia 10/2014  . Type 1 diabetes ([place])    onset age 58    Past Surgical History:  Procedure Laterality Date  . CESAREAN SECTION    . EYE SURGERY    . FOOT AMPUTATION THROUGH METATARSAL Left   . hemrrhoidectomy    . I&D EXTREMITY Right 06/10/2014   Procedure: IRRIGATION AND DEBRIDEMENT Right Foot;  Surgeon: Newt Minion, MD;  Location: [place];  Service: Orthopedics;  Laterality: Right;  . PERIPHERALLY INSERTED CENTRAL CATHETER INSERTION    . SKIN SPLIT GRAFT Right 06/15/2014   Procedure: SPLIT THICKNESS SKIN GRAFT RIGHT FOOT;  Surgeon: Newt Minion, MD;  Location: [facility];  Service: Orthopedics;  Laterality: Right;  . TRANSMETATARSAL AMPUTATION Right     Family History  Problem Relation Age of Onset  . Hyperlipidemia Mother   . Dementia Mother     Social History:  reports that she quit smoking about 7 years ago. She has never used smokeless  tobacco. She reports that she drinks about 0.5 oz of alcohol per week . She reports that she does not use drugs.  Allergies:  Allergies  Allergen Reactions  . Lisinopril Other (See Comments)    Elevated potassium per pt report  . Ciprofloxacin Itching  . Cleocin [Clindamycin Hcl]     Diarrhea     Medications: I have reviewed the patient's current medications.  Results for orders placed or performed during the hospital encounter of 11/03/15 (from the past 48 hour(s))  Glucose, capillary     Status: Abnormal   Collection Time: 11/04/15  8:19 PM  Result Value Ref Range   Glucose-Capillary 246 (H) 65 - 99 mg/dL  Glucose, capillary     Status: Abnormal   Collection Time: 11/05/15  8:18 AM  Result Value Ref Range   Glucose-Capillary 214 (H) 65 - 99 mg/dL  Glucose, capillary     Status: Abnormal   Collection Time: 11/05/15 12:08 PM  Result Value Ref Range   Glucose-Capillary 225 (H) 65 - 99 mg/dL  Glucose, capillary     Status: Abnormal   Collection Time: 11/05/15  6:02 PM  Result Value Ref Range   Glucose-Capillary 333 (H) 65 - 99 mg/dL  Glucose, capillary     Status: Abnormal   Collection Time: 11/05/15  9:41 PM  Result Value Ref Range   Glucose-Capillary 296 (H) 65 - 99 mg/dL  Glucose,  capillary     Status: Abnormal   Collection Time: 11/06/15  7:39 AM  Result Value Ref Range   Glucose-Capillary 178 (H) 65 - 99 mg/dL  Glucose, capillary     Status: Abnormal   Collection Time: 11/06/15 12:22 PM  Result Value Ref Range   Glucose-Capillary 220 (H) 65 - 99 mg/dL  Glucose, capillary     Status: Abnormal   Collection Time: 11/06/15  5:44 PM  Result Value Ref Range   Glucose-Capillary 317 (H) 65 - 99 mg/dL    Mr Foot Left Wo Contrast  Result Date: 11/05/2015 CLINICAL DATA:  Diabetic patient with a history of multiple foot infections and amputations. Redness, pain and swelling of the left foot developing over the past 2 days. EXAM: MRI OF THE LEFT FOOT WITHOUT CONTRAST  TECHNIQUE: Multiplanar, multisequence MR imaging was performed. No intravenous contrast was administered. COMPARISON:  MRI of the left foot 10/13/2014. FINDINGS: A fluid collection with septations and mixed signal intensity measuring 2.8 cm long by 1.2 cm transverse by 2.1 cm craniocaudal is seen at the level the calcaneocuboid joint. No other fluid collection is seen. Tendons are intact. Intrinsic musculature the foot is atrophied. Abnormal signal is seen in the distal tibia at the plafond. All imaged bones of the foot demonstrate areas of increased T2 signal. Although there is abnormal signal in the cuboid and fifth metatarsal, these bones are relatively spared. No fracture is identified. No evidence of arthropathy is seen. IMPRESSION: Subcutaneous fluid collection at the level the calcaneocuboid joint could be due to abscess or hematoma. It should be easily amenable to sampling. Marrow edema in all visualized bones the foot pain and the distal tibia is new since the prior MRI and nonspecific. Signal abnormality could be due to multifocal osteomyelitis or stress change. Electronically Signed   By: Inge Rise M.D.   On: 11/05/2015 12:42    Review of Systems  Constitutional: Positive for chills and fever.  Respiratory: Negative.   Cardiovascular: Negative.   Musculoskeletal: Positive for myalgias.  Skin: Positive for rash.  Endo/Heme/Allergies:       Diabetes  Psychiatric/Behavioral: Positive for depression.   Blood pressure (!) 159/72, pulse 91, temperature 99.4 F (37.4 C), temperature source Oral, resp. rate 17, height 5\' 7"  (1.702 m), weight 59 kg (130 lb), SpO2 98 %. Physical Exam  Constitutional: No distress.  HENT:  Head: Normocephalic and atraumatic.  Left cheek staph infection with pustular formation in the middle another area over her hairline at the top of her head  Eyes: Pupils are equal, round, and reactive to light.  Neck: Normal range of motion.  Cardiovascular: Normal  rate.   Respiratory: Effort normal.  GI: Soft.  Musculoskeletal:  Palpable fluctuance laterally the dorsal to the peroneal tendon insertion the base of the fifth and extending over the extensor brevis muscle region.  Neurological: She is alert.  Skin: She is not diaphoretic.    Assessment/Plan: Multiple staph infections air her foot is flexion 1 I recommend debridement at the bedside since she has neuropathy and with palpation only back to the heel and above the ankle does she have complaints of pain with pressure. She agrees to proceed with this outlined plan.  Jenae Tomasello C 11/06/2015, 6:59 PM    addendum: After prepping with Betadine with the help of the floor nurse the [facility] we prepped there with Betadine and opened with a sterile 10 blade. Blunt spreading with tip of hemostats and pickups of bloody drainage was obtained  with the only trace purulence. A culture was obtained. No evidence of the necrotic tissue. Patient state that when she got into the shower she had some drainage from the region and that is why she thinks her is a much left. Dressing was applied and this can be changed daily. [place] [phone]

## 2015-11-06 NOTE — Progress Notes (Addendum)
PROGRESS NOTE  [name]  [id number] DOB: Oct 02, 1970 DOA: 11/03/2015 PCP: Jackelyn Knife, MD Outpatient Specialists:  Subjective: Denies any new complaints, orthopedics consulted, likely to do bedside I&D later today. Patient asked for pain medications, she she is concerned that she will have pain even with the insensate neuropathy.  Brief Narrative:  [name] is a 45 y.o. female with medical history significant of type 1 DM with CKD and multiple foot infections s/p multiple amputations.  Had partial left foot amputation starting in February by former podiatrist, Dr. Neomia Dear.  Was discharged to rehab about 2 months ago on [facility].  Home health nursing q2 days, it was fine 2 days ago.  Over the last 2 days she has noticed increasing redness, drainage, and a blister that appears to contain pus.  Difficulty walking on it for the last 2 days as well.  Pressure from ankle to knee.  No fevers.  No other symptoms.  Podiatrist wanted to do foot amputation and she didn't want to do that; went to [place] for second opinion - Dr. Scharlene Corn.  He did not recommend amputation.  She has been discharged from former podiatrist's practice.  She is still in a CAM walker.   Of note, she also saw Dr. Sharol Given in the past and refuses to see him again. She also developed a lesion on her face last Friday; doctor gave her some topical ointment.  Also has developed 2 lesions on scalp and one in groin.  Finally was given PO Vanc and Bactrim for these lesions 2 days ago.  She has a h/o Staph aureus bacteremia (MRSA) in 7/16.  Assessment & Plan:   Principal Problem:   Cellulitis of left foot Active Problems:   Hypertension   Hypercholesteremia   Charcot foot due to diabetes mellitus ([place])   Foot ulcer due to secondary DM ([place])   Acute kidney injury superimposed on chronic kidney disease (HCC)   Status post transmetatarsal amputation of right foot (HCC)   Hyperkalemia   Type 1  diabetes mellitus with hyperglycemia (HCC)   Chronic anemia   Protein-calorie malnutrition, moderate (HCC)   DVT of upper extremity (deep vein thrombosis) (HCC)   Hyponatremia   Bacterial folliculitis   Heart murmur   Boil of scalp   Boil of left cheek   Boil of lower abdomen   MRSA cellulitis   Cellulitis of left foot -Currently with fairly obvious superficial infection of the left foot -Patient reported that she received oral antibiotics without help, she is trying to avoid any type of amputation. -MRI was done (it was pending for 48 hours for technical difficulties) showed subcutaneous fluid collection. -Discussed with Dr. Lorin Mercy of orthopedics, he will do bedside I&D. -Continue current antibiotics, she is on Zosyn and vancomycin.  Folliculitis -Patient with h/o MRSA bacteremia, now with multiple lesions that appear clinically consistent with MRSA folliculitis -Gen Surg consulted, recommended to continue antibiotics therapy. -With previously undocumented murmur (echo in 7/16 without mention), echo did not show any findings suggest fragmentation. -Increase in the LVOT velocity could cause the systolic murmur heard over the aortic area.  AKI on CKD -Prior diagnosis of CKD stage III. -Prior creatinine 1.46 in 1/17, now 3.06 -Check BMP in a.m.  Hyponatremia -Likely secondary to volume deficiency, as above  Hyperkalemia -Treated with Kayexalate at [facility] -This is resolved with IV fluid hydration.  Type 1 DM with hyperglycemia and diabetic insensate neuropathy -CO2 is down but no anion gap -Patient has insulin  pump and prefers to manage with this  -If unable, may need insulin drip as glucose control will help with infection control (and vice versa) -Diabetes consult requested  Chronic anemia -Appears stable -Will follow  History of DVT -Patient has history of bilateral upper extremity DVT secondary to [facility] line and central venous catheter. -Patient was treated with  anticoagulation for 3 months, per records from [facility] repeat Doppler showed resolution of DVTs. -Hold anticoagulation, I do not think she needs any.   DVT prophylaxis: SQ Heparin Code Status: DNR Family Communication:  Disposition Plan:  Diet: Diet Carb Modified Fluid consistency: Thin; Room service appropriate? Yes  Consultants:   Gen Surgery  Procedures:  None  Antimicrobials:   Vanc and Zosyn   Objective: Vitals:   11/05/15 0549 11/05/15 1406 11/05/15 2145 11/06/15 0539  BP: (!) 117/54 (!) 156/75 (!) 176/79 (!) 151/73  Pulse: 75 83 92 80  Resp: 16 18 18 16   Temp: 98.5 F (36.9 C) 98 F (36.7 C) 98.9 F (37.2 C) 97.7 F (36.5 C)  TempSrc: Oral Oral Oral Axillary  SpO2: 98% 100% 100% 98%  Weight:      Height:        Intake/Output Summary (Last 24 hours) at 11/06/15 1138 Last data filed at 11/06/15 0635  Gross per 24 hour  Intake              825 ml  Output              500 ml  Net              325 ml   Filed Weights   11/03/15 1433  Weight: 59 kg (130 lb)    Examination: General exam: Appears calm and comfortable  Respiratory system: Clear to auscultation. Respiratory effort normal. Cardiovascular system: S1 & S2 heard, RRR. No JVD, murmurs, rubs, gallops or clicks. No pedal edema. Gastrointestinal system: Abdomen is nondistended, soft and nontender. No organomegaly or masses felt. Normal bowel sounds heard. Central nervous system: Alert and oriented. No focal neurological deficits. Extremities: Symmetric 5 x 5 power. Skin: No rashes, lesions or ulcers Psychiatry: Judgement and insight appear normal. Mood & affect appropriate.   Data Reviewed: I have personally reviewed following labs and imaging studies  CBC:  Recent Labs Lab 11/03/15 1525 11/04/15 0504  WBC 6.8 4.9  NEUTROABS 4.8  --   HGB 11.1* 10.2*  HCT 32.9* 30.7*  MCV 81.6 82.3  PLT 457* [id number]*   Basic Metabolic Panel:  Recent Labs Lab 11/03/15 1525 11/04/15 0504  NA 130* 140  K  5.5* 4.6  CL 107 119*  CO2 12* 14*  GLUCOSE 332* 127*  BUN 59* 50*  CREATININE 3.06* 2.71*  CALCIUM 8.0* 7.4*   GFR: Estimated Creatinine Clearance: 24.9 mL/min (by C-G formula based on SCr of 2.71 mg/dL). Liver Function Tests: No results for input(s): AST, ALT, ALKPHOS, BILITOT, PROT, ALBUMIN in the last 168 hours. No results for input(s): LIPASE, AMYLASE in the last 168 hours. No results for input(s): AMMONIA in the last 168 hours. Coagulation Profile: No results for input(s): INR, PROTIME in the last 168 hours. Cardiac Enzymes: No results for input(s): CKTOTAL, CKMB, CKMBINDEX, TROPONINI in the last 168 hours. BNP (last 3 results) No results for input(s): PROBNP in the last 8760 hours. HbA1C:  Recent Labs  11/04/15 0504  HGBA1C 8.4*   CBG:  Recent Labs Lab 11/05/15 0818 11/05/15 1208 11/05/15 1802 11/05/15 2141 11/06/15 0739  GLUCAP 214* 225*  333* 296* 178*   Lipid Profile: No results for input(s): CHOL, HDL, LDLCALC, TRIG, CHOLHDL, LDLDIRECT in the last 72 hours. Thyroid Function Tests: No results for input(s): TSH, T4TOTAL, FREET4, T3FREE, THYROIDAB in the last 72 hours. Anemia Panel: No results for input(s): VITAMINB12, FOLATE, FERRITIN, TIBC, IRON, RETICCTPCT in the last 72 hours. Urine analysis:    Component Value Date/Time   COLORURINE YELLOW 01/26/2015 1200   APPEARANCEUR CLOUDY (A) 01/26/2015 1200   LABSPEC >=1.030 (A) 01/26/2015 1200   PHURINE 5.5 01/26/2015 1200   GLUCOSEU 500 (A) 01/26/2015 1200   HGBUR SMALL (A) 01/26/2015 1200   BILIRUBINUR NEGATIVE 01/26/2015 1200   KETONESUR NEGATIVE 01/26/2015 1200   PROTEINUR 100 (A) 09/01/2014 1259   UROBILINOGEN 0.2 01/26/2015 1200   NITRITE POSITIVE (A) 01/26/2015 1200   LEUKOCYTESUR NEGATIVE 01/26/2015 1200   Sepsis Labs: @LABRCNTIP (procalcitonin:4,lacticidven:4)  ) Recent Results (from the past 240 hour(s))  Culture, blood (Routine X 2) w Reflex to ID Panel     Status: None (Preliminary result)    Collection Time: 11/03/15  3:25 PM  Result Value Ref Range Status   Specimen Description BLOOD RIGHT FOREARM  Final   Special Requests BOTTLES DRAWN AEROBIC AND ANAEROBIC 5CC EACH  Final   Culture   Final    NO GROWTH 2 DAYS Performed at [facility]    Report Status PENDING  Incomplete  Culture, blood (Routine X 2) w Reflex to ID Panel     Status: None (Preliminary result)   Collection Time: 11/03/15  3:35 PM  Result Value Ref Range Status   Specimen Description BLOOD LEFT FOREARM  Final   Special Requests BOTTLES DRAWN AEROBIC AND ANAEROBIC 5CC EACH  Final   Culture   Final    NO GROWTH 2 DAYS Performed at [facility]    Report Status PENDING  Incomplete  Culture, blood (routine x 2)     Status: None (Preliminary result)   Collection Time: 11/03/15  8:55 PM  Result Value Ref Range Status   Specimen Description BLOOD RIGHT HAND  Final   Special Requests IN PEDIATRIC BOTTLE 2CC  Final   Culture   Final    NO GROWTH 1 DAY Performed at [facility]    Report Status PENDING  Incomplete  Culture, blood (routine x 2)     Status: None (Preliminary result)   Collection Time: 11/03/15  9:00 PM  Result Value Ref Range Status   Specimen Description BLOOD LEFT HAND  Final   Special Requests BOTTLES DRAWN AEROBIC ONLY 5CC  Final   Culture   Final    NO GROWTH 1 DAY Performed at [facility]    Report Status PENDING  Incomplete     Invalid input(s): PROCALCITONIN, LACTICACIDVEN   Radiology Studies: Mr Foot Left Wo Contrast  Result Date: 11/05/2015 CLINICAL DATA:  Diabetic patient with a history of multiple foot infections and amputations. Redness, pain and swelling of the left foot developing over the past 2 days. EXAM: MRI OF THE LEFT FOOT WITHOUT CONTRAST TECHNIQUE: Multiplanar, multisequence MR imaging was performed. No intravenous contrast was administered. COMPARISON:  MRI of the left foot 10/13/2014. FINDINGS: A fluid collection with septations  and mixed signal intensity measuring 2.8 cm long by 1.2 cm transverse by 2.1 cm craniocaudal is seen at the level the calcaneocuboid joint. No other fluid collection is seen. Tendons are intact. Intrinsic musculature the foot is atrophied. Abnormal signal is seen in the distal tibia at the plafond.  All imaged bones of the foot demonstrate areas of increased T2 signal. Although there is abnormal signal in the cuboid and fifth metatarsal, these bones are relatively spared. No fracture is identified. No evidence of arthropathy is seen. IMPRESSION: Subcutaneous fluid collection at the level the calcaneocuboid joint could be due to abscess or hematoma. It should be easily amenable to sampling. Marrow edema in all visualized bones the foot pain and the distal tibia is new since the prior MRI and nonspecific. Signal abnormality could be due to multifocal osteomyelitis or stress change. Electronically Signed   By: Inge Rise M.D.   On: 11/05/2015 12:42        Scheduled Meds: . calcitRIOL  0.25 mcg Oral Q M,W,F  . docusate sodium  100 mg Oral BID  . enoxaparin (LOVENOX) injection  30 mg Subcutaneous Q24H  . ferrous sulfate  325 mg Oral TID WC  . folic acid  1 mg Oral Daily  . gabapentin  100 mg Oral TID  . insulin aspart  0-9 Units Subcutaneous TID WC  . insulin pump   Subcutaneous Q4H  . piperacillin-tazobactam (ZOSYN)  IV  3.375 g Intravenous Q8H  . pravastatin  20 mg Oral q1800  . pyridoxine  100 mg Oral Daily  . saccharomyces boulardii  250 mg Oral TID  . vancomycin  750 mg Intravenous Q24H  . vitamin B-12  200 mcg Oral Daily   Continuous Infusions: . sodium chloride 75 mL/hr at 11/06/15 0635     LOS: 3 days    Time spent: 35 minutes    Chaos Carlile A, MD [facility] Pager [phone]  If 7PM-7AM, please contact night-coverage [email or web address] Password [email or web address] 11/06/2015, 11:38 AM

## 2015-11-06 NOTE — Progress Notes (Signed)
Patient ID: [name], female   DOB: 05-25-1970, 45 y.o.   MRN: [id number] Full consult to follow late afternoon. I will return for bedside I and D of sub Q abscess which is likely MRSA like all the other areas she has , face, head, etc. Will do this at about 5 pm . She can eat. With Neuropathy she will not need anesthesia.

## 2015-11-07 DIAGNOSIS — E440 Moderate protein-calorie malnutrition: Secondary | ICD-10-CM

## 2015-11-07 LAB — GLUCOSE, CAPILLARY
GLUCOSE-CAPILLARY: 272 mg/dL — AB (ref 65–99)
GLUCOSE-CAPILLARY: 140 mg/dL — AB (ref 65–99)
GLUCOSE-CAPILLARY: 84 mg/dL (ref 65–99)
Glucose-Capillary: 209 mg/dL — ABNORMAL HIGH (ref 65–99)

## 2015-11-07 LAB — BASIC METABOLIC PANEL
Anion gap: 8 (ref 5–15)
BUN: 30 mg/dL — AB (ref 6–20)
CHLORIDE: 115 mmol/L — AB (ref 101–111)
CO2: 14 mmol/L — AB (ref 22–32)
CREATININE: 1.9 mg/dL — AB (ref 0.44–1.00)
Calcium: 7.9 mg/dL — ABNORMAL LOW (ref 8.9–10.3)
GFR calc Af Amer: 36 mL/min — ABNORMAL LOW (ref >60)
GFR calc non Af Amer: 31 mL/min — ABNORMAL LOW (ref >60)
GLUCOSE: 203 mg/dL — AB (ref 65–99)
POTASSIUM: 5 mmol/L (ref 3.5–5.1)
SODIUM: 137 mmol/L (ref 135–145)

## 2015-11-07 LAB — VANCOMYCIN, TROUGH: VANCOMYCIN TR: 22 ug/mL — AB (ref 15–20)

## 2015-11-07 MED ORDER — INSULIN ASPART 100 UNIT/ML ~~LOC~~ SOLN
0.0000 [IU] | Freq: Three times a day (TID) | SUBCUTANEOUS | Status: DC
  Administered 2015-11-08: 9 [IU] via SUBCUTANEOUS

## 2015-11-07 MED ORDER — ENOXAPARIN SODIUM 40 MG/0.4ML ~~LOC~~ SOLN
40.0000 mg | SUBCUTANEOUS | Status: DC
  Administered 2015-11-07: 40 mg via SUBCUTANEOUS
  Filled 2015-11-07: qty 0.4

## 2015-11-07 MED ORDER — INSULIN LISPRO 100 UNIT/ML CARTRIDGE
10.0000 [IU] | Freq: Once | SUBCUTANEOUS | Status: DC
  Filled 2015-11-07: qty 3

## 2015-11-07 MED ORDER — INSULIN GLARGINE 100 UNIT/ML ~~LOC~~ SOLN
14.0000 [IU] | Freq: Every day | SUBCUTANEOUS | Status: DC
  Filled 2015-11-07 (×2): qty 0.14

## 2015-11-07 MED ORDER — INSULIN ASPART 100 UNIT/ML ~~LOC~~ SOLN
10.0000 [IU] | Freq: Once | SUBCUTANEOUS | Status: DC
  Filled 2015-11-07: qty 1

## 2015-11-07 MED ORDER — INSULIN ASPART 100 UNIT/ML ~~LOC~~ SOLN: 0.0000 [IU] | Freq: Three times a day (TID) | SUBCUTANEOUS | Status: DC

## 2015-11-07 MED ORDER — INSULIN ASPART 100 UNIT/ML ~~LOC~~ SOLN
4.0000 [IU] | Freq: Three times a day (TID) | SUBCUTANEOUS | Status: DC
  Administered 2015-11-08: 4 [IU] via SUBCUTANEOUS

## 2015-11-07 NOTE — Progress Notes (Signed)
Pharmacy Antibiotic Follow-up Note  [name] is a 45 y.o. year-old female admitted on 11/03/2015.  The patient is currently on day 1 of Vancomycin & Zosyn for diabetic foot cellulitis. Also has "lesions" on face, scalp, and labia per H&P.  Placed on TMP/SMZ and PO vancomycin prior to admission to treat these "lesions."  PO vancomycin does not achieve any appreciable plasma concentrations, thus only used for localized infections of GI tract and should have not been used for this indication.   Today, 11/07/2015:  SCr not checked since 8/4, had been improving but will need to re-assess while pt on Vancomycin therapy.  Pt refused BMET 3x this morning.   WBC WNL  No fevers  8/5 MRI foot: abscess & ?multifocal osteo.     Most recent VT supratherapeutic at 22 mcg/mL, drawn appropriately.  However, SCr continues to trend down  Assessment/Plan:  Continue vancomycin 750mg  IV q24h for goal trough 15-20 mcg/ml until osteomyelitis ruled out; will delay next dose until 10a tomorrow d/t high VT  Measure vancomycin trough levels at steady state as indicated  Continue Zosyn 3.375g IV q8h (infuse over 4 hours)  Follow cultures, renal function and MRI results  Temp (24hrs), Avg:98 F (36.7 C), Min:97.7 F (36.5 C), Max:98.2 F (36.8 C)   Recent Labs Lab 11/03/15 1525 11/04/15 0504  WBC 6.8 4.9     Recent Labs Lab 11/03/15 1525 11/04/15 0504 11/07/15 1517  CREATININE 3.06* 2.71* 1.90*   Estimated Creatinine Clearance: 35.6 mL/min (by C-G formula based on SCr of 1.9 mg/dL).    Allergies  Allergen Reactions  . Lisinopril Other (See Comments)    Elevated potassium per pt report  . Ciprofloxacin Itching  . Cleocin [Clindamycin Hcl]     Diarrhea    Antimicrobials this admission: 8/3 Vancomycin >>  8/3 Zosyn >>   Levels/dose changes this admission: 8/4: change vanco to 750mg  q24h for improved renal fx, zosyn to ZEI 8/7 VT = 22; leave current dosing as SCr continues to  improve   Microbiology results: 8/3 BCx: NGTD 8/3 BCx (set #2): NGTD 8/6 Wound Cx (left foot): GPCs in pairs  Thank you for allowing pharmacy to be a part of this patient's care.  Reuel Boom, PharmD, BCPS Pager: [phone] 11/07/2015, 4:01 PM

## 2015-11-07 NOTE — Progress Notes (Signed)
  Subjective: She has lesions on her head, face and Left lower foot.  No sensation in her foot.  I&D of the foot abscess last PM Dr. Lorin Mercy. I took the dressing down and cleaned site, Minimal drainage, still a bit red around the edges of the site.    Objective: Vital signs in last 24 hours: Temp:  [97.7 F (36.5 C)-99.4 F (37.4 C)] 97.7 F (36.5 C) (08/07 0620) Pulse Rate:  [73-91] 73 (08/07 0620) Resp:  [16-17] 16 (08/07 0620) BP: (150-162)/(72-84) 150/76 (08/07 0620) SpO2:  [98 %-99 %] 99 % (08/07 0620) Last BM Date: 11/06/15 1320 PO VOIDED X 8, BM x 2 recorded Afebrile, VSS Glucose 135-317 range MRI 11/04/13:  Subcutaneous fluid collection at the level the calcaneocuboid joint could be due to abscess or hematoma. It should be easily amenable to sampling.  Marrow edema in all visualized bones the foot pain and the distal tibia is new since the prior MRI and nonspecific. Signal abnormality could be due to multifocal osteomyelitis or stress change.  Intake/Output from previous day: 08/06 0701 - 08/07 0700 In: [id number] [P.O.:1320; I.V.:350] Out: -  Intake/Output this shift: No intake/output data recorded.  General appearance: alert, cooperative and no distress Skin: facial site still has some fluid in it, with erythema, the lower foot has had I&D.  site is clean and still erythematous.    Lab Results:  No results for input(s): WBC, HGB, HCT, PLT in the last 72 hours.  BMET No results for input(s): NA, K, CL, CO2, GLUCOSE, BUN, CREATININE, CALCIUM in the last 72 hours. PT/INR No results for input(s): LABPROT, INR in the last 72 hours.  No results for input(s): AST, ALT, ALKPHOS, BILITOT, PROT, ALBUMIN in the last 168 hours.   Lipase     Component Value Date/Time   LIPASE 11 (L) 09/01/2014 1023     Studies/Results: No results found.  Medications: . calcitRIOL  0.25 mcg Oral Q M,W,F  . docusate sodium  100 mg Oral BID  . enoxaparin (LOVENOX) injection  30 mg Subcutaneous  Q24H  . ferrous sulfate  325 mg Oral TID WC  . folic acid  1 mg Oral Daily  . gabapentin  100 mg Oral TID  . insulin aspart  0-9 Units Subcutaneous TID WC  . insulin pump   Subcutaneous Q4H  . piperacillin-tazobactam (ZOSYN)  IV  3.375 g Intravenous Q8H  . pravastatin  20 mg Oral q1800  . pyridoxine  100 mg Oral Daily  . saccharomyces boulardii  250 mg Oral TID  . vancomycin  750 mg Intravenous Q24H  . vitamin B-12  200 mcg Oral Daily    Assessment/Plan Left foot cellulitis Charcot foot disease Prior partial foot amputation transmet on the right, s/p partial left foot and 5th toe amputation Type I diabetes Acute on chronic kidney disease Hypokalemia/Hyponatremia Chronic anemia FEN: CARB Mod ID:  Day 5 antibiotics, day 4 Zosyn/Vancomycin DVT:  Lovenox    Plan:  Ortho/medicine      LOS: 4 days    JENNINGS,[name] 11/07/2015 [phone]  Agree with above. She may need I&D of left check wound - will follow.  Hgb A1C - 8.4 - 11/04/2015 Sees Dr. Dwyane Dee for endocrinology, but has not seen him in over a year.  Alphonsa Overall, MD, [facility] Pager: [phone] Office phone:  [phone]

## 2015-11-07 NOTE — Progress Notes (Signed)
PROGRESS NOTE  [name]  [id number] DOB: 1970-11-17 DOA: 11/03/2015 PCP: No primary care provider on file. Outpatient Specialists:  Subjective: No new complaints, blood glucose continued to be elevated, patient refusing to switch to Lantus insulin while she is in the hospital.  Brief Narrative:  [name] is a 45 y.o. female with medical history significant of type 1 DM with CKD and multiple foot infections s/p multiple amputations.  Had partial left foot amputation starting in February by former podiatrist, [name].  Was discharged to rehab about 2 months ago on [facility].  Home health nursing q2 days, it was fine 2 days ago.  Over the last 2 days she has noticed increasing redness, drainage, and a blister that appears to contain pus.  Difficulty walking on it for the last 2 days as well.  Pressure from ankle to knee.  No fevers.  No other symptoms.  Podiatrist wanted to do foot amputation and she didn't want to do that; went to [place] for second opinion - Dr. Scharlene [name].  He did not recommend amputation.  She has been discharged from former podiatrist's practice.  She is still in a CAM walker.   Of note, she also saw [name] in the past and refuses to see him again. She also developed a lesion on her face last Friday; doctor gave her some topical ointment.  Also has developed 2 lesions on scalp and one in groin.  Finally was [name] PO Vanc and Bactrim for these lesions 2 days ago.  She has a h/o Staph aureus bacteremia (MRSA) in 7/16.  Assessment & Plan:   Principal Problem:   Cellulitis of left foot Active Problems:   Hypertension   Hypercholesteremia   Charcot foot due to diabetes mellitus ([place])   Foot ulcer due to secondary DM ([place])   Acute kidney injury superimposed on chronic kidney disease (HCC)   Status post transmetatarsal amputation of right foot (HCC)   Hyperkalemia   Type 1 diabetes mellitus with hyperglycemia (HCC)   Chronic anemia  Protein-calorie malnutrition, moderate (HCC)   DVT of upper extremity (deep vein thrombosis) (HCC)   Hyponatremia   Bacterial folliculitis   Heart murmur   Boil of scalp   Boil of left cheek   Boil of lower abdomen   MRSA cellulitis   Cellulitis of left foot -Currently with fairly obvious superficial infection of the left foot -Patient reported that she received oral antibiotics without help, she is trying to avoid any type of amputation. -MRI was done (it was pending for 48 hours for technical difficulties) showed subcutaneous fluid collection. -Discussed with Dr. Lorin Mercy of orthopedics, bedside I&D was done yesterday with removal of minimally purulent fluids -Gram stain showed GBC in pairs, culture still pending.  Folliculitis -Patient with h/o MRSA bacteremia, now with multiple lesions that appear clinically consistent with MRSA folliculitis -Gen Surg consulted, recommended to continue antibiotics therapy. -With previously undocumented murmur (echo in 7/16 without mention), echo did not show any findings suggest fragmentation. -Increase in the LVOT velocity could cause the systolic murmur heard over the aortic area.  AKI on CKD -Prior diagnosis of CKD stage III. -Prior creatinine 1.46 in 1/17, now 3.06 -Check BMP in a.m.  Hyponatremia -Likely secondary to volume deficiency, as above  Hyperkalemia -Treated with Kayexalate at [facility] -This is resolved with IV fluid hydration.  Type 1 DM with hyperglycemia and diabetic insensate neuropathy -CO2 is down but no anion gap -Patient has insulin pump and prefers  to manage with this  -Blood sugar not ideally controlled, but patient resisting any change in her insulin regimen, she is managing her insulin pump. -I will increase the SSI dose.  Chronic anemia -Appears stable -Will follow  History of DVT -Patient has history of bilateral upper extremity DVT secondary to [facility] line and central venous catheter. -Patient was treated  with anticoagulation for 3 months, per records from [facility] repeat Doppler showed resolution of DVTs. -Hold anticoagulation, I do not think she needs any.  History of C. difficile -Patient reported that she had C. difficile 3 times before, last time was last summer. -We'll try to pinpoint antibiotics on discharge. Likely doxycycline versus Bactrim.  DVT prophylaxis: SQ Heparin Code Status: DNR Family Communication:  Disposition Plan:  Diet: Diet Carb Modified Fluid consistency: Thin; Room service appropriate? Yes  Consultants:   Gen Surgery  Procedures:  None  Antimicrobials:   Vanc and Zosyn   Objective: Vitals:   11/06/15 0539 11/06/15 1410 11/06/15 2122 11/07/15 0620  BP: (!) 151/73 (!) 159/72 (!) 162/84 (!) 150/76  Pulse: 80 91 78 73  Resp: 16 17 17 16   Temp: 97.7 F (36.5 C) 99.4 F (37.4 C) 98.2 F (36.8 C) 97.7 F (36.5 C)  TempSrc: Axillary Oral Oral Oral  SpO2: 98% 98% 98% 99%  Weight:      Height:        Intake/Output Summary (Last 24 hours) at 11/07/15 1159 Last data filed at 11/07/15 1000  Gross per 24 hour  Intake             1430 ml  Output                0 ml  Net             1430 ml   Filed Weights   11/03/15 1433  Weight: 59 kg (130 lb)    Examination: General exam: Appears calm and comfortable  Respiratory system: Clear to auscultation. Respiratory effort normal. Cardiovascular system: S1 & S2 heard, RRR. No JVD, murmurs, rubs, gallops or clicks. No pedal edema. Gastrointestinal system: Abdomen is nondistended, soft and nontender. No organomegaly or masses felt. Normal bowel sounds heard. Central nervous system: Alert and oriented. No focal neurological deficits. Extremities: Symmetric 5 x 5 power. Skin: No rashes, lesions or ulcers Psychiatry: Judgement and insight appear normal. Mood & affect appropriate.   Data Reviewed: I have personally reviewed following labs and imaging studies  CBC:  Recent Labs Lab 11/03/15 1525  11/04/15 0504  WBC 6.8 4.9  NEUTROABS 4.8  --   HGB 11.1* 10.2*  HCT 32.9* 30.7*  MCV 81.6 82.3  PLT 457* [id number]*   Basic Metabolic Panel:  Recent Labs Lab 11/03/15 1525 11/04/15 0504  NA 130* 140  K 5.5* 4.6  CL 107 119*  CO2 12* 14*  GLUCOSE 332* 127*  BUN 59* 50*  CREATININE 3.06* 2.71*  CALCIUM 8.0* 7.4*   GFR: Estimated Creatinine Clearance: 24.9 mL/min (by C-G formula based on SCr of 2.71 mg/dL). Liver Function Tests: No results for input(s): AST, ALT, ALKPHOS, BILITOT, PROT, ALBUMIN in the last 168 hours. No results for input(s): LIPASE, AMYLASE in the last 168 hours. No results for input(s): AMMONIA in the last 168 hours. Coagulation Profile: No results for input(s): INR, PROTIME in the last 168 hours. Cardiac Enzymes: No results for input(s): CKTOTAL, CKMB, CKMBINDEX, TROPONINI in the last 168 hours. BNP (last 3 results) No results for input(s): PROBNP in the last  8760 hours. HbA1C: No results for input(s): HGBA1C in the last 72 hours. CBG:  Recent Labs Lab 11/06/15 0739 11/06/15 1222 11/06/15 1744 11/06/15 2129 11/07/15 0733  GLUCAP 178* 220* 317* 135* 209*   Lipid Profile: No results for input(s): CHOL, HDL, LDLCALC, TRIG, CHOLHDL, LDLDIRECT in the last 72 hours. Thyroid Function Tests: No results for input(s): TSH, T4TOTAL, FREET4, T3FREE, THYROIDAB in the last 72 hours. Anemia Panel: No results for input(s): VITAMINB12, FOLATE, FERRITIN, TIBC, IRON, RETICCTPCT in the last 72 hours. Urine analysis:    Component Value Date/Time   COLORURINE YELLOW 01/26/2015 1200   APPEARANCEUR CLOUDY (A) 01/26/2015 1200   LABSPEC >=1.030 (A) 01/26/2015 1200   PHURINE 5.5 01/26/2015 1200   GLUCOSEU 500 (A) 01/26/2015 1200   HGBUR SMALL (A) 01/26/2015 1200   BILIRUBINUR NEGATIVE 01/26/2015 1200   KETONESUR NEGATIVE 01/26/2015 1200   PROTEINUR 100 (A) 09/01/2014 1259   UROBILINOGEN 0.2 01/26/2015 1200   NITRITE POSITIVE (A) 01/26/2015 1200   LEUKOCYTESUR  NEGATIVE 01/26/2015 1200   Sepsis Labs: @LABRCNTIP (procalcitonin:4,lacticidven:4)  ) Recent Results (from the past 240 hour(s))  Culture, blood (Routine X 2) w Reflex to ID Panel     Status: None (Preliminary result)   Collection Time: 11/03/15  3:25 PM  Result Value Ref Range Status   Specimen Description BLOOD RIGHT FOREARM  Final   Special Requests BOTTLES DRAWN AEROBIC AND ANAEROBIC 5CC EACH  Final   Culture   Final    NO GROWTH 3 DAYS Performed at [facility]    Report Status PENDING  Incomplete  Culture, blood (Routine X 2) w Reflex to ID Panel     Status: None (Preliminary result)   Collection Time: 11/03/15  3:35 PM  Result Value Ref Range Status   Specimen Description BLOOD LEFT FOREARM  Final   Special Requests BOTTLES DRAWN AEROBIC AND ANAEROBIC 5CC EACH  Final   Culture   Final    NO GROWTH 3 DAYS Performed at [facility]    Report Status PENDING  Incomplete  Culture, blood (routine x 2)     Status: None (Preliminary result)   Collection Time: 11/03/15  8:55 PM  Result Value Ref Range Status   Specimen Description BLOOD RIGHT HAND  Final   Special Requests IN PEDIATRIC BOTTLE [place]  Final   Culture   Final    NO GROWTH 2 DAYS Performed at [facility]    Report Status PENDING  Incomplete  Culture, blood (routine x 2)     Status: None (Preliminary result)   Collection Time: 11/03/15  9:00 PM  Result Value Ref Range Status   Specimen Description BLOOD LEFT HAND  Final   Special Requests BOTTLES DRAWN AEROBIC ONLY 5CC  Final   Culture   Final    NO GROWTH 2 DAYS Performed at [facility]    Report Status PENDING  Incomplete  Aerobic Culture (superficial specimen)     Status: None (Preliminary result)   Collection Time: 11/06/15  6:27 PM  Result Value Ref Range Status   Specimen Description WOUND FOOT LEFT  Final   Special Requests Immunocompromised  Final   Gram Stain   Final    MODERATE WBC PRESENT,BOTH PMN AND  MONONUCLEAR FEW GRAM POSITIVE COCCI IN PAIRS    Culture PENDING  Incomplete   Report Status PENDING  Incomplete     Invalid input(s): PROCALCITONIN, [place]   Radiology Studies: No results found.      Scheduled Meds: .  calcitRIOL  0.25 mcg Oral Q M,W,F  . docusate sodium  100 mg Oral BID  . enoxaparin (LOVENOX) injection  30 mg Subcutaneous Q24H  . ferrous sulfate  325 mg Oral TID WC  . folic acid  1 mg Oral Daily  . gabapentin  100 mg Oral TID  . insulin aspart  0-9 Units Subcutaneous TID WC  . insulin pump   Subcutaneous Q4H  . piperacillin-tazobactam (ZOSYN)  IV  3.375 g Intravenous Q8H  . pravastatin  20 mg Oral q1800  . pyridoxine  100 mg Oral Daily  . saccharomyces boulardii  250 mg Oral TID  . vancomycin  750 mg Intravenous Q24H  . vitamin B-12  200 mcg Oral Daily   Continuous Infusions: . sodium chloride 75 mL/hr at 11/07/15 0634     LOS: 4 days    Time spent: 35 minutes    Angeles Paolucci A, MD [facility] Pager [phone]  If 7PM-7AM, please contact night-coverage [email or web address] Password [email or web address] 11/07/2015, 11:59 AM

## 2015-11-07 NOTE — Progress Notes (Signed)
Pharmacy Antibiotic Follow-up Note  [name] is a 45 y.o. year-old female admitted on 11/03/2015.  The patient is currently on day 1 of Vancomycin & Zosyn for diabetic foot cellulitis. Also has "lesions" on face, scalp, and labia per H&P.  Placed on TMP/SMZ and PO vancomycin prior to admission to treat these "lesions."  PO vancomycin does not achieve any appreciable plasma concentrations, thus only used for localized infections of GI tract and should have not been used for this indication.   Today, 11/07/2015:  SCr not checked since 8/4, had been improving but will need to re-assess while pt on Vancomycin therapy.  Pt refused BMET 3x this morning.   WBC WNL  No fevers  8/5 MRI foot: abscess & ?multifocal osteo.    Assessment/Plan:  Continue vancomycin 750mg  IV q24h for goal trough 15-20 mcg/ml until osteomyelitis ruled out  Vancomycin trough based on length of therapy and renal function  Check BMET and VT this afternoon   Continue Zosyn 3.375g IV q8h (infuse over 4 hours)  Follow cultures, renal function and MRI results  Temp (24hrs), Avg:98.4 F (36.9 C), Min:97.7 F (36.5 C), Max:99.4 F (37.4 C)   Recent Labs Lab 11/03/15 1525 11/04/15 0504  WBC 6.8 4.9     Recent Labs Lab 11/03/15 1525 11/04/15 0504  CREATININE 3.06* 2.71*   Estimated Creatinine Clearance: 24.9 mL/min (by C-G formula based on SCr of 2.71 mg/dL).    Allergies  Allergen Reactions  . Lisinopril Other (See Comments)    Elevated potassium per pt report  . Ciprofloxacin Itching  . Cleocin [Clindamycin Hcl]     Diarrhea    Antimicrobials this admission: 8/3 Vancomycin >>  8/3 Zosyn >>   Levels/dose changes this admission: 8/4: change vanco to 750mg  q24h for improved renal fx, zosyn to [place]  Microbiology results: 8/3 BCx: NGTD 8/3 BCx (set #2): NGTD 8/6 Wound Cx (left foot): collected  Thank you for allowing pharmacy to be a part of this patient's care.  Ralene Bathe, PharmD,  BCPS 11/07/2015, 10:49 AM  Pager: [phone]

## 2015-11-07 NOTE — Care Management Important Message (Signed)
Important Message  Patient Details IM Letter given to Suzanne/Case Manager to present to Patient Name: [name] MRN: [id number] Date of Birth: May 20, 1970   Medicare Important Message Given:  Yes    Camillo Flaming 11/07/2015, 11:12 AMImportant Message  Patient Details  Name: [name] MRN: [id number] Date of Birth: October 09, 1970   Medicare Important Message Given:  Yes    Camillo Flaming 11/07/2015, 11:12 AM

## 2015-11-07 NOTE — Progress Notes (Signed)
Subjective: Doing well.  Pain controlled.    Objective: Vital signs in last 24 hours: Temp:  [97.7 F (36.5 C)-99.4 F (37.4 C)] 97.7 F (36.5 C) (08/07 0620) Pulse Rate:  [73-91] 73 (08/07 0620) Resp:  [16-17] 16 (08/07 0620) BP: (150-162)/(72-84) 150/76 (08/07 0620) SpO2:  [98 %-99 %] 99 % (08/07 0620)  Intake/Output from previous day: 08/06 0701 - 08/07 0700 In: [id number] [P.O.:1320; I.V.:350] Out: -  Intake/Output this shift: No intake/output data recorded.  No results for input(s): HGB in the last 72 hours. No results for input(s): WBC, RBC, HCT, PLT in the last 72 hours. No results for input(s): NA, K, CL, CO2, BUN, CREATININE, GLUCOSE, CALCIUM in the last 72 hours. No results for input(s): LABPT, INR in the last 72 hours.  Exam:  Pleasant female, alert and oriented.  NAD.  Dressing left foot C/D/I.  States was just changed.   Assessment/Plan: Continue present care.  Wound cultures pending.  Gram stain showed few gram pos cocci in pairs.    Trestan Vahle M 11/07/2015, 11:37 AM

## 2015-11-07 NOTE — Progress Notes (Addendum)
Inpatient Diabetes Program Recommendations  AACE/ADA: New Consensus Statement on Inpatient Glycemic Control (2015)  Target Ranges:  Prepandial:   less than 140 mg/dL      Peak postprandial:   less than 180 mg/dL (1-2 hours)      Critically ill patients:  140 - 180 mg/dL   Lab Results  Component Value Date   GLUCAP 272 (H) 11/07/2015   HGBA1C 8.4 (H) 11/04/2015    Review of Glycemic Control  Results for ARAMI, RIDINGER (MRN [id number]) as of 11/07/2015 15:38  Ref. Range 11/06/2015 12:22 11/06/2015 17:44 11/06/2015 21:29 11/07/2015 07:33 11/07/2015 13:56  Glucose-Capillary Latest Ref Range: 65 - 99 mg/dL 220 (H) 317 (H) 135 (H) 209 (H) 272 (H)   Ate nothing at lunch. Does not have insulin pump supplies with her. Explained to pt that she does not meet criteria to keep insulin pump on. Has not changed site since 8/3. Blood sugars are not controlled with pump. Pt is refusing to take off insulin pump. Safest way to control blood sugars is to put on GlucoStabilizer. Pt is Type 1 and at risk for going into DKA.  Inpatient Diabetes Program Recommendations:    IV insulin/GlucoStabilizer for glycemic control. Insulin pump needs to be in suspend.  Paged MD. Discussed with RN. Thank you. Lorenda Peck, RD, LDN, CDE Inpatient Diabetes Coordinator [phone]   Addendum - To keep pt out of DKA, consider Lantus 14 units Q24H, Novolog sensitive tidwc and hs + 4 units tidwc if pt eats > 50% meal.   RV

## 2015-11-08 LAB — CULTURE, BLOOD (ROUTINE X 2)
Culture: NO GROWTH
Culture: NO GROWTH

## 2015-11-08 LAB — BASIC METABOLIC PANEL
ANION GAP: 7 (ref 5–15)
BUN: 31 mg/dL — ABNORMAL HIGH (ref 6–20)
CALCIUM: 7.9 mg/dL — AB (ref 8.9–10.3)
CO2: 15 mmol/L — ABNORMAL LOW (ref 22–32)
Chloride: 113 mmol/L — ABNORMAL HIGH (ref 101–111)
Creatinine, Ser: 2.14 mg/dL — ABNORMAL HIGH (ref 0.44–1.00)
GFR, EST AFRICAN AMERICAN: 31 mL/min — AB (ref >60)
GFR, EST NON AFRICAN AMERICAN: 27 mL/min — AB (ref >60)
Glucose, Bld: 389 mg/dL — ABNORMAL HIGH (ref 65–99)
POTASSIUM: 5.6 mmol/L — AB (ref 3.5–5.1)
Sodium: 135 mmol/L (ref 135–145)

## 2015-11-08 LAB — GLUCOSE, CAPILLARY
GLUCOSE-CAPILLARY: 129 mg/dL — AB (ref 65–99)
GLUCOSE-CAPILLARY: 230 mg/dL — AB (ref 65–99)
Glucose-Capillary: 361 mg/dL — ABNORMAL HIGH (ref 65–99)

## 2015-11-08 MED ORDER — SACCHAROMYCES BOULARDII 250 MG PO CAPS: 250.0000 mg | ORAL_CAPSULE | Freq: Three times a day (TID) | ORAL | 0 refills | Status: DC

## 2015-11-08 MED ORDER — DOXYCYCLINE HYCLATE 100 MG PO TABS: 100.0000 mg | ORAL_TABLET | Freq: Two times a day (BID) | ORAL | 0 refills | Status: DC

## 2015-11-08 NOTE — Progress Notes (Signed)
Discharge instructions reviewed with patient including diet and medications.  Prescriptions send to pharmacy by MD.  Patient verbalized understanding.    Mayrin Schmuck RN

## 2015-11-08 NOTE — Progress Notes (Deleted)
Physician Discharge Summary  [name] [id number] DOB: 08/21/1970 DOA: 11/03/2015  PCP: No primary care provider on file.  Admit date: 11/03/2015 Discharge date: 11/08/2015  Admitted From: Home Disposition: Home  Recommendations for Outpatient Follow-up:  1. Follow up with PCP in 1-2 weeks 2. Please obtain BMP/CBC in one week  Home Health: No Equipment/Devices None  Discharge Condition: Stable  CODE STATUS: Full Diet recommendation:Carb Modified   Brief/Interim Summary: [name] is a 45 y.o. female with medical history significant of type 1 DM with CKD and multiple foot infections s/p multiple amputations.  Had partial left foot amputation starting in February by former podiatrist, Dr. Neomia Dear.  Was discharged to rehab about 2 months ago on [facility].  Home health nursing q2 days, it was fine 2 days ago.  Over the last 2 days she has noticed increasing redness, drainage, and a blister that appears to contain pus.  Difficulty walking on it for the last 2 days as well.  Pressure from ankle to knee.  No fevers.  No other symptoms.  Podiatrist wanted to do foot amputation and she didn't want to do that; went to [place] for second opinion - Dr. Scharlene Corn.  He did not recommend amputation.  She has been discharged from former podiatrist's practice.  She is still in a CAM walker.   Of note, she also saw Dr. Sharol Given in the past and refuses to see him again.  She also developed a lesion on her face last Friday; doctor gave her some topical ointment.  Also has developed 2 lesions on scalp and one in groin.  Finally was given PO Vanc and Bactrim for these lesions 2 days ago.  She has a h/o Staph aureus bacteremia (MRSA) in 7/16.  Discharge Diagnoses:  Principal Problem:   Cellulitis of left foot Active Problems:   Hypertension   Hypercholesteremia   Charcot foot due to diabetes mellitus ([place])   Foot ulcer due to secondary DM ([place])   Acute kidney injury superimposed on  chronic kidney disease (HCC)   Status post transmetatarsal amputation of right foot (HCC)   Hyperkalemia   Type 1 diabetes mellitus with hyperglycemia (HCC)   Chronic anemia   Protein-calorie malnutrition, moderate (HCC)   DVT of upper extremity (deep vein thrombosis) (HCC)   Hyponatremia   Bacterial folliculitis   Heart murmur   Boil of scalp   Boil of left cheek   Boil of lower abdomen   MRSA cellulitis   Cellulitis of left foot -Presented with blister over the lateral side of the left foot, appears to be superficial infection. -Patient reported that she received oral antibiotics without help, she is trying to avoid any type of amputation. -MRI was done (it was pending for 48 hours for technical difficulties) showed subcutaneous fluid collection. -Discussed with Dr. Lorin Mercy of orthopedics, bedside I&D was done yesterday with removal of minimally purulent fluids -Gram stain showed GBC in pairs, culture still pending at the time of discharge. -Discussed briefly with Dr. Baxter Flattery of ID, recommended doxycycline for 2 weeks on discharge.  Folliculitis -Patient with h/o MRSA bacteremia, now with multiple lesions that appear clinically consistent with MRSA folliculitis -Gen Surg consulted, recommended to continue antibiotics therapy. -With previously undocumented murmur (echo in 7/16 without mention), echo did not show any findings suggest vegetation. -Increase in the LVOT velocity could cause the systolic murmur heard over the aortic area.  AKI on CKD -Prior diagnosis of CKD stage III. -Prior creatinine 1.46 in 1/17,  now 3.06 -Patient refusing blood draws. Diffuse BMP this morning and yesterday.  Hyponatremia -Likely secondary to volume deficiency, as above  Hyperkalemia -Treated with Kayexalate at [facility] -This is resolved with IV fluid hydration.  Type 1 DM with hyperglycemia and diabetic insensate neuropathy -Patient has insulin pump and prefers to manage with this  -Blood  sugar was not controlled during the hospital stay, patient was acidotic without anion gap. -Likely this was not in DKA, she was also did not have any symptoms of DKA. She diffuse BMP to be done 3 times. -Instructed to follow-up with her endocrinologist.  Chronic anemia -Appears stable -Will follow  History of DVT -Patient has history of bilateral upper extremity DVT secondary to [facility] line and central venous catheter. -Patient was treated with anticoagulation for 3 months, repeat Doppler on 07/22/2015 showed resolution of the DVT. -Anticoagulation discontinued, these records available at [facility], through [place].  History of C. difficile -Patient reported that she had C. difficile 3 times before, last time was last summer. -We'll try to pinpoint antibiotics on discharge. Likely doxycycline versus Bactrim.  Discharge Instructions  Discharge Instructions    Diet Carb Modified    Complete by:  As directed   Increase activity slowly    Complete by:  As directed       Medication List    STOP taking these medications   apixaban 5 MG Tabs tablet Commonly known as:  ELIQUIS   lactobacillus acidophilus Tabs tablet Replaced by:  saccharomyces boulardii 250 MG capsule   sulfamethoxazole-trimethoprim 800-160 MG tablet Commonly known as:  BACTRIM DS,SEPTRA DS     TAKE these medications   albuterol 108 (90 Base) MCG/ACT inhaler Commonly known as:  PROVENTIL HFA;VENTOLIN HFA Inhale 2 puffs into the lungs every 6 (six) hours as needed. Reported on 04/15/2015   amLODipine 10 MG tablet Commonly known as:  NORVASC Take 10 mg by mouth daily.   calcitRIOL 0.25 MCG capsule Commonly known as:  ROCALTROL Take 0.25 mcg by mouth every Monday, Wednesday, and Friday. Reported on 04/15/2015   cloNIDine 0.1 MG tablet Commonly known as:  CATAPRES Take 0.1 mg by mouth every 8 (eight) hours as needed.   doxycycline 100 MG tablet Commonly known as:  VIBRA-TABS Take 1 tablet  (100 mg total) by mouth 2 (two) times daily.   ferrous sulfate 325 (65 FE) MG tablet Take 1 tablet (325 mg total) by mouth 3 (three) times daily with meals. Reported on [id number]   folic acid 1 MG tablet Commonly known as:  FOLVITE Take 1 mg by mouth daily. Reported on 04/15/2015   furosemide 40 MG tablet Commonly known as:  LASIX Take 40 mg by mouth daily as needed.   gabapentin 300 MG capsule Commonly known as:  NEURONTIN Take 300 mg by mouth every 12 (twelve) hours as needed.   gabapentin 100 MG capsule Commonly known as:  NEURONTIN Take 1 capsule (100 mg total) by mouth 3 (three) times daily.   HYDROcodone-acetaminophen 10-325 MG tablet Commonly known as:  NORCO Take 1 tablet by mouth every 6 (six) hours as needed.   insulin lispro 100 UNIT/ML injection Commonly known as:  HUMALOG Inject into the skin 3 (three) times daily before meals. Per sliding scale   mupirocin ointment 2 % Commonly known as:  BACTROBAN Place 1 application into the nose 2 (two) times daily.   norethindrone 5 MG tablet Commonly known as:  AYGESTIN Take 1 tablet by mouth daily.   NOVOLOG 100 UNIT/ML  injection Generic drug:  insulin aspart USE MAX 65 UNITS PER DAY WITH PUMP   pravastatin 20 MG tablet Commonly known as:  PRAVACHOL Take 1 tablet by mouth daily.   pyridoxine 100 MG tablet Commonly known as:  B-6 Take 100 mg by mouth daily. Reported on 04/15/2015   saccharomyces boulardii 250 MG capsule Commonly known as:  FLORASTOR Take 1 capsule (250 mg total) by mouth 3 (three) times daily. Replaces:  lactobacillus acidophilus Tabs tablet   vitamin B-12 100 MCG tablet Commonly known as:  CYANOCOBALAMIN Take 200 mcg by mouth daily. Reported on 04/15/2015   Vitamin D (Ergocalciferol) 50000 units Caps capsule Commonly known as:  DRISDOL Take 1 capsule (50,000 Units total) by mouth every 7 (seven) days.      Follow-up Information    YATES,MARK C, MD Follow up in 2 week(s).   Specialty:   Orthopedic Surgery Contact information: [address] [place] [address] [phone]          Allergies  Allergen Reactions  . Lisinopril Other (See Comments)    Elevated potassium per pt report  . Ciprofloxacin Itching  . Cleocin [Clindamycin Hcl]     Diarrhea     Consultations:  Gen. surgery.   Orthopedics. Dr. Lorin Mercy.   Procedures/Studies: Mr Foot Left Wo Contrast  Result Date: 11/05/2015 CLINICAL DATA:  Diabetic patient with a history of multiple foot infections and amputations. Redness, pain and swelling of the left foot developing over the past 2 days. EXAM: MRI OF THE LEFT FOOT WITHOUT CONTRAST TECHNIQUE: Multiplanar, multisequence MR imaging was performed. No intravenous contrast was administered. COMPARISON:  MRI of the left foot 10/13/2014. FINDINGS: A fluid collection with septations and mixed signal intensity measuring 2.8 cm long by 1.2 cm transverse by 2.1 cm craniocaudal is seen at the level the calcaneocuboid joint. No other fluid collection is seen. Tendons are intact. Intrinsic musculature the foot is atrophied. Abnormal signal is seen in the distal tibia at the plafond. All imaged bones of the foot demonstrate areas of increased T2 signal. Although there is abnormal signal in the cuboid and fifth metatarsal, these bones are relatively spared. No fracture is identified. No evidence of arthropathy is seen. IMPRESSION: Subcutaneous fluid collection at the level the calcaneocuboid joint could be due to abscess or hematoma. It should be easily amenable to sampling. Marrow edema in all visualized bones the foot pain and the distal tibia is new since the prior MRI and nonspecific. Signal abnormality could be due to multifocal osteomyelitis or stress change. Electronically Signed   By: Inge Rise M.D.   On: 11/05/2015 12:42    (Echo, Carotid, EGD, Colonoscopy, ERCP)    Subjective:   Discharge Exam: Vitals:   11/07/15 2140 11/08/15 0500  BP: (!)  178/88 (!) 157/88  Pulse: 75 77  Resp: 18 18  Temp: 98.4 F (36.9 C) 98.2 F (36.8 C)   Vitals:   11/07/15 0620 11/07/15 1358 11/07/15 2140 11/08/15 0500  BP: (!) 150/76 (!) 165/77 (!) 178/88 (!) 157/88  Pulse: 73 73 75 77  Resp: 16 18 18 18   Temp: 97.7 F (36.5 C) 98.2 F (36.8 C) 98.4 F (36.9 C) 98.2 F (36.8 C)  TempSrc: Oral  Oral Oral  SpO2: 99% 99% 99% 99%  Weight:      Height:        General: Pt is alert, awake, not in acute distress Cardiovascular: RRR, S1/S2 +, no rubs, no gallops Respiratory: CTA bilaterally, no wheezing, no  rhonchi Abdominal: Soft, NT, ND, bowel sounds + Extremities: no edema, no cyanosis    The results of significant diagnostics from this hospitalization (including imaging, microbiology, ancillary and laboratory) are listed below for reference.     Microbiology: Recent Results (from the past 240 hour(s))  Culture, blood (Routine X 2) w Reflex to ID Panel     Status: None   Collection Time: 11/03/15  3:25 PM  Result Value Ref Range Status   Specimen Description BLOOD RIGHT FOREARM  Final   Special Requests BOTTLES DRAWN AEROBIC AND ANAEROBIC 5CC EACH  Final   Culture   Final    NO GROWTH 5 DAYS Performed at [facility]    Report Status 11/08/2015 FINAL  Final  Culture, blood (Routine X 2) w Reflex to ID Panel     Status: None   Collection Time: 11/03/15  3:35 PM  Result Value Ref Range Status   Specimen Description BLOOD LEFT FOREARM  Final   Special Requests BOTTLES DRAWN AEROBIC AND ANAEROBIC 5CC EACH  Final   Culture   Final    NO GROWTH 5 DAYS Performed at [facility]    Report Status 11/08/2015 FINAL  Final  Culture, blood (routine x 2)     Status: None (Preliminary result)   Collection Time: 11/03/15  8:55 PM  Result Value Ref Range Status   Specimen Description BLOOD RIGHT HAND  Final   Special Requests IN PEDIATRIC BOTTLE [place]  Final   Culture   Final    NO GROWTH 4 DAYS Performed at [facility]    Report Status PENDING  Incomplete  Culture, blood (routine x 2)     Status: None (Preliminary result)   Collection Time: 11/03/15  9:00 PM  Result Value Ref Range Status   Specimen Description BLOOD LEFT HAND  Final   Special Requests BOTTLES DRAWN AEROBIC ONLY 5CC  Final   Culture   Final    NO GROWTH 4 DAYS Performed at [facility]    Report Status PENDING  Incomplete  Aerobic Culture (superficial specimen)     Status: None (Preliminary result)   Collection Time: 11/06/15  6:27 PM  Result Value Ref Range Status   Specimen Description WOUND FOOT LEFT  Final   Special Requests Immunocompromised  Final   Gram Stain   Final    MODERATE WBC PRESENT,BOTH PMN AND MONONUCLEAR FEW GRAM POSITIVE COCCI IN PAIRS    Culture PENDING  Incomplete   Report Status PENDING  Incomplete     Labs: BNP (last 3 results) No results for input(s): BNP in the last 8760 hours. Basic Metabolic Panel:  Recent Labs Lab 11/03/15 1525 11/04/15 0504 11/07/15 1517  NA 130* 140 137  K 5.5* 4.6 5.0  CL 107 119* 115*  CO2 12* 14* 14*  GLUCOSE 332* 127* 203*  BUN 59* 50* 30*  CREATININE 3.06* 2.71* 1.90*  CALCIUM 8.0* 7.4* 7.9*   Liver Function Tests: No results for input(s): AST, ALT, ALKPHOS, BILITOT, PROT, ALBUMIN in the last 168 hours. No results for input(s): LIPASE, AMYLASE in the last 168 hours. No results for input(s): AMMONIA in the last 168 hours. CBC:  Recent Labs Lab 11/03/15 1525 11/04/15 0504  WBC 6.8 4.9  NEUTROABS 4.8  --   HGB 11.1* 10.2*  HCT 32.9* 30.7*  MCV 81.6 82.3  PLT 457* 434*   Cardiac Enzymes: No results for input(s): CKTOTAL, CKMB, CKMBINDEX, TROPONINI in the last 168 hours. BNP:  Invalid input(s): POCBNP CBG:  Recent Labs Lab 11/07/15 1758 11/07/15 2138 11/08/15 0138 11/08/15 0742 11/08/15 1200  GLUCAP 140* 84 129* 230* 361*   D-Dimer No results for input(s): DDIMER in the last 72 hours. Hgb A1c No results for input(s): HGBA1C  in the last 72 hours. Lipid Profile No results for input(s): CHOL, HDL, LDLCALC, TRIG, CHOLHDL, LDLDIRECT in the last 72 hours. Thyroid function studies No results for input(s): TSH, T4TOTAL, T3FREE, THYROIDAB in the last 72 hours.  Invalid input(s): FREET3 Anemia work up No results for input(s): VITAMINB12, FOLATE, FERRITIN, TIBC, IRON, RETICCTPCT in the last 72 hours. Urinalysis    Component Value Date/Time   COLORURINE YELLOW 01/26/2015 1200   APPEARANCEUR CLOUDY (A) 01/26/2015 1200   LABSPEC >=1.030 (A) 01/26/2015 1200   PHURINE 5.5 01/26/2015 1200   GLUCOSEU 500 (A) 01/26/2015 1200   HGBUR SMALL (A) 01/26/2015 1200   BILIRUBINUR NEGATIVE 01/26/2015 1200   KETONESUR NEGATIVE 01/26/2015 1200   PROTEINUR 100 (A) 09/01/2014 1259   UROBILINOGEN 0.2 01/26/2015 1200   NITRITE POSITIVE (A) 01/26/2015 1200   LEUKOCYTESUR NEGATIVE 01/26/2015 1200   Sepsis Labs Invalid input(s): PROCALCITONIN,  WBC,  LACTICIDVEN Microbiology Recent Results (from the past 240 hour(s))  Culture, blood (Routine X 2) w Reflex to ID Panel     Status: None   Collection Time: 11/03/15  3:25 PM  Result Value Ref Range Status   Specimen Description BLOOD RIGHT FOREARM  Final   Special Requests BOTTLES DRAWN AEROBIC AND ANAEROBIC 5CC EACH  Final   Culture   Final    NO GROWTH 5 DAYS Performed at [facility]    Report Status 11/08/2015 FINAL  Final  Culture, blood (Routine X 2) w Reflex to ID Panel     Status: None   Collection Time: 11/03/15  3:35 PM  Result Value Ref Range Status   Specimen Description BLOOD LEFT FOREARM  Final   Special Requests BOTTLES DRAWN AEROBIC AND ANAEROBIC 5CC EACH  Final   Culture   Final    NO GROWTH 5 DAYS Performed at [facility]    Report Status 11/08/2015 FINAL  Final  Culture, blood (routine x 2)     Status: None (Preliminary result)   Collection Time: 11/03/15  8:55 PM  Result Value Ref Range Status   Specimen Description BLOOD RIGHT HAND  Final    Special Requests IN PEDIATRIC BOTTLE [place]  Final   Culture   Final    NO GROWTH 4 DAYS Performed at [facility]    Report Status PENDING  Incomplete  Culture, blood (routine x 2)     Status: None (Preliminary result)   Collection Time: 11/03/15  9:00 PM  Result Value Ref Range Status   Specimen Description BLOOD LEFT HAND  Final   Special Requests BOTTLES DRAWN AEROBIC ONLY 5CC  Final   Culture   Final    NO GROWTH 4 DAYS Performed at [facility]    Report Status PENDING  Incomplete  Aerobic Culture (superficial specimen)     Status: None (Preliminary result)   Collection Time: 11/06/15  6:27 PM  Result Value Ref Range Status   Specimen Description WOUND FOOT LEFT  Final   Special Requests Immunocompromised  Final   Gram Stain   Final    MODERATE WBC PRESENT,BOTH PMN AND MONONUCLEAR FEW GRAM POSITIVE COCCI IN PAIRS    Culture PENDING  Incomplete   Report Status PENDING  Incomplete  Time coordinating discharge: Over 30 minutes  SIGNED:   Birdie Hopes, MD  [facility] 11/08/2015, 12:35 PM Pager   If 7PM-7AM, please contact night-coverage [email or web address] Password [email or web address]

## 2015-11-08 NOTE — Progress Notes (Signed)
CM called [facility] to notify of discharge and has faxed per [facility] request orders, H&P, and DC summary.  NO other CM needs were communicated.

## 2015-11-08 NOTE — Progress Notes (Signed)
  Subjective: Foot dressing has not been changed since I did it yesterday.  She keeps scratching her face and the sites in her hair line on her head.  She says they put out pus each AM. But she's cleaning it with had wipes as I'm telling her to quit scratching it if she can.    Objective: Vital signs in last 24 hours: Temp:  [98.2 F (36.8 C)-98.4 F (36.9 C)] 98.2 F (36.8 C) (08/08 0500) Pulse Rate:  [73-77] 77 (08/08 0500) Resp:  [18] 18 (08/08 0500) BP: (157-178)/(77-88) 157/88 (08/08 0500) SpO2:  [99 %] 99 % (08/08 0500) Last BM Date: 11/07/15 960 PO Afebrile, VSS No labs No radiology study today  Intake/Output from previous day: 08/07 0701 - 08/08 0700 In: 1065 [P.O.:960; IV Piggyback:105] Out: -  Intake/Output this shift: Total I/O In: 120 [P.O.:120] Out: -   General appearance: alert, cooperative and no distress Skin: 2 sites in her hair line show the epidermis is gone, same findings with the site on her left cheek.  Foot is clean.    Lab Results:  No results for input(s): WBC, HGB, HCT, PLT in the last 72 hours.  BMET  Recent Labs  11/07/15 1517  NA 137  K 5.0  CL 115*  CO2 14*  GLUCOSE 203*  BUN 30*  CREATININE 1.90*  CALCIUM 7.9*   PT/INR No results for input(s): LABPROT, INR in the last 72 hours.  No results for input(s): AST, ALT, ALKPHOS, BILITOT, PROT, ALBUMIN in the last 168 hours.   Lipase     Component Value Date/Time   LIPASE 11 (L) 09/01/2014 1023     Studies/Results: No results found.  Medications: . calcitRIOL  0.25 mcg Oral Q M,W,F  . docusate sodium  100 mg Oral BID  . enoxaparin (LOVENOX) injection  40 mg Subcutaneous Q24H  . ferrous sulfate  325 mg Oral TID WC  . folic acid  1 mg Oral Daily  . gabapentin  100 mg Oral TID  . insulin aspart  0-9 Units Subcutaneous TID WC  . insulin aspart  4 Units Subcutaneous TID WC  . insulin glargine  14 Units Subcutaneous QHS  . piperacillin-tazobactam (ZOSYN)  IV  3.375 g  Intravenous Q8H  . pravastatin  20 mg Oral q1800  . pyridoxine  100 mg Oral Daily  . saccharomyces boulardii  250 mg Oral TID  . vancomycin  750 mg Intravenous Q24H  . vitamin B-12  200 mcg Oral Daily    Assessment/Plan Left foot cellulitis Charcot foot disease Prior partial foot amputation transmet on the right, s/p partial left foot and 5th toe amputation Type I diabetes Acute on chronic kidney disease Hypokalemia/Hyponatremia Chronic anemia FEN: CARB Mod ID:  Day 5 antibiotics, day 4 Zosyn/Vancomycin completed 11/07/15 Vancomycin only today ordered (no growth on cultures) DVT:  Lovenox   Plan:  Pt for dc today.   I would clean sites with soap and water several times per day.  Try to stop scratching.  Ongoing antibiotics, follow up with PCP and  Dr. Lorin Mercy.  DC summary says they are sending her home on doxycycline stopping Apixaban and Septra.      LOS: 5 days    JENNINGS,[name] 11/08/2015 [phone]  Agree with above. She is going home today. Needs closer DM follow up.  I have stressed to her.  Alphonsa Overall, MD, [facility] Pager: [phone] Office phone:  [phone]

## 2015-11-09 LAB — CULTURE, BLOOD (ROUTINE X 2)
CULTURE: NO GROWTH
Culture: NO GROWTH

## 2015-11-09 LAB — AEROBIC CULTURE  (SUPERFICIAL SPECIMEN)

## 2015-11-09 LAB — AEROBIC CULTURE W GRAM STAIN (SUPERFICIAL SPECIMEN)

## 2015-11-09 NOTE — Discharge Summary (Signed)
[] Hide copied text Physician Discharge Summary  [name] [id number] DOB: 04-25-1970 DOA: 11/03/2015  PCP: No primary care provider on file.  Admit date: 11/03/2015 Discharge date: 11/08/2015  Admitted From: Home Disposition: Home  Recommendations for Outpatient Follow-up:  1. Follow up with PCP in 1-2 weeks 2. Please obtain BMP/CBC in one week  Home Health: No Equipment/Devices None  Discharge Condition: Stable  CODE STATUS: Full Diet recommendation:Carb Modified   Brief/Interim Summary: Nancy L Gallowayis a 45 y.o.femalewith medical history significant of type 1 DM with CKD and multiple foot infections s/p multiple amputations. Had partial left foot amputation starting in February by former podiatrist, Dr. Neomia Dear. Was discharged to rehab about 2 months ago on [facility]. Home health nursing q2 days, it was fine 2 days ago. Over the last 2 days she has noticed increasing redness, drainage, and a blister that appears to contain pus. Difficulty walking on it for the last 2 days as well. Pressure from ankle to knee. No fevers. No other symptoms. Podiatrist wanted to do foot amputation and she didn't want to do that; went to [place] for second opinion - Dr. Scharlene Corn. He did not recommend amputation. She has been discharged from former podiatrist's practice. She is still in a CAM walker. Of note, she also saw Dr. Sharol Given in the past and refuses to see him again.  She also developed a lesion on her face last Friday;doctor gave her some topical ointment. Also has developed2 lesions on scalp and one in groin. Finally was given PO Vanc and Bactrim for these lesions 2 days ago. She has a h/o Staph aureus bacteremia (MRSA) in 7/16.  Discharge Diagnoses:  Principal Problem:   Cellulitis of left foot Active Problems:   Hypertension   Hypercholesteremia   Charcot foot due to diabetes mellitus ([place])   Foot ulcer due to secondary DM ([place])   Acute  kidney injury superimposed on chronic kidney disease (HCC)   Status post transmetatarsal amputation of right foot (HCC)   Hyperkalemia   Type 1 diabetes mellitus with hyperglycemia (HCC)   Chronic anemia   Protein-calorie malnutrition, moderate (HCC)   DVT of upper extremity (deep vein thrombosis) (HCC)   Hyponatremia   Bacterial folliculitis   Heart murmur   Boil of scalp   Boil of left cheek   Boil of lower abdomen   MRSA cellulitis  Cellulitis of left foot -Presented with blister over the lateral side of the left foot, appears to be superficial infection. -Patient reported that she received oral antibiotics without help, she is trying to avoid any type of amputation. -MRI was done (it was pending for 48 hours for technical difficulties) showed subcutaneous fluid collection. -Discussed with Dr. Lorin Mercy of orthopedics, bedside I&D was done yesterday with removal of minimally purulent fluids -Gram stain showed GBC in pairs, culture still pending at the time of discharge. -Discussed briefly with Dr. Baxter Flattery of ID, recommended doxycycline for 2 weeks on discharge.  Folliculitis -Patient with h/o MRSA bacteremia, now with multiple lesions that appear clinically consistent with MRSA folliculitis -Gen Surg consulted, recommended to continue antibiotics therapy. -With previously undocumented murmur (echo in 7/16 without mention), echo did not show any findings suggest vegetation. -Increase in the LVOT velocity could cause the systolic murmur heard over the aortic area.  AKI on CKD -Prior diagnosis of CKD stage III. -Prior creatinine 1.46 in 1/17, now 3.06 -Patient refusing blood draws. Diffuse BMP this morning and yesterday.  Hyponatremia -Likely secondary to volume deficiency, as  above  Hyperkalemia -Treated with Kayexalate at [facility] -This is resolved with IV fluid hydration.  Type 1 DM with hyperglycemia and diabetic insensate neuropathy -Patient has insulin pump and prefers to  manage with this  -Blood sugar was not controlled during the hospital stay, patient was acidotic without anion gap. -Likely this was not in DKA, she was also did not have any symptoms of DKA. She diffuse BMP to be done 3 times. -Instructed to follow-up with her endocrinologist.  Chronic anemia -Appears stable -Will follow  History of DVT -Patient has history of bilateral upper extremity DVT secondary to [facility] line and central venous catheter. -Patient was treated with anticoagulation for 3 months, repeat Doppler on 07/22/2015 showed resolution of the DVT. -Anticoagulation discontinued, these records available at [facility], through [place].  History of C. difficile -Patient reported that she had C. difficile 3 times before, last time was last summer. -We'll try to pinpoint antibiotics on discharge. Likely doxycycline versus Bactrim.  Discharge Instructions      Discharge Instructions    Diet Carb Modified    Complete by:  As directed   Increase activity slowly    Complete by:  As directed       Medication List    STOP taking these medications   apixaban 5 MG Tabs tablet Commonly known as:  ELIQUIS  lactobacillus acidophilus Tabs tablet Replaced by:  saccharomyces boulardii 250 MG capsule  sulfamethoxazole-trimethoprim 800-160 MG tablet Commonly known as:  BACTRIM DS,SEPTRA DS    TAKE these medications   albuterol 108 (90 Base) MCG/ACT inhaler Commonly known as:  PROVENTIL HFA;VENTOLIN HFA Inhale 2 puffs into the lungs every 6 (six) hours as needed. Reported on 04/15/2015  amLODipine 10 MG tablet Commonly known as:  NORVASC Take 10 mg by mouth daily.  calcitRIOL 0.25 MCG capsule Commonly known as:  ROCALTROL Take 0.25 mcg by mouth every Monday, Wednesday, and Friday. Reported on 04/15/2015  cloNIDine 0.1 MG tablet Commonly known as:  CATAPRES Take 0.1 mg by mouth every 8 (eight) hours as needed.  doxycycline 100 MG tablet Commonly known as:   VIBRA-TABS Take 1 tablet (100 mg total) by mouth 2 (two) times daily.  ferrous sulfate 325 (65 FE) MG tablet Take 1 tablet (325 mg total) by mouth 3 (three) times daily with meals. Reported on [id number]  folic acid 1 MG tablet Commonly known as:  FOLVITE Take 1 mg by mouth daily. Reported on 04/15/2015  furosemide 40 MG tablet Commonly known as:  LASIX Take 40 mg by mouth daily as needed.  gabapentin 300 MG capsule Commonly known as:  NEURONTIN Take 300 mg by mouth every 12 (twelve) hours as needed.  gabapentin 100 MG capsule Commonly known as:  NEURONTIN Take 1 capsule (100 mg total) by mouth 3 (three) times daily.  HYDROcodone-acetaminophen 10-325 MG tablet Commonly known as:  NORCO Take 1 tablet by mouth every 6 (six) hours as needed.  insulin lispro 100 UNIT/ML injection Commonly known as:  HUMALOG Inject into the skin 3 (three) times daily before meals. Per sliding scale  mupirocin ointment 2 % Commonly known as:  BACTROBAN Place 1 application into the nose 2 (two) times daily.  norethindrone 5 MG tablet Commonly known as:  AYGESTIN Take 1 tablet by mouth daily.  NOVOLOG 100 UNIT/ML injection Generic drug:  insulin aspart USE MAX 65 UNITS PER DAY WITH PUMP  pravastatin 20 MG tablet Commonly known as:  PRAVACHOL Take 1 tablet by mouth daily.  pyridoxine 100  MG tablet Commonly known as:  B-6 Take 100 mg by mouth daily. Reported on 04/15/2015  saccharomyces boulardii 250 MG capsule Commonly known as:  FLORASTOR Take 1 capsule (250 mg total) by mouth 3 (three) times daily. Replaces:  lactobacillus acidophilus Tabs tablet  vitamin B-12 100 MCG tablet Commonly known as:  CYANOCOBALAMIN Take 200 mcg by mouth daily. Reported on 04/15/2015  Vitamin D (Ergocalciferol) 50000 units Caps capsule Commonly known as:  DRISDOL Take 1 capsule (50,000 Units total) by mouth every 7 (seven) days.        Follow-up Information    YATES,MARK C, MD Follow up in 2 week(s).   Specialty:   Orthopedic Surgery Contact information: [address] [place] [address] [phone]               Allergies  Allergen Reactions  . Lisinopril Other (See Comments)    Elevated potassium per pt report  . Ciprofloxacin Itching  . Cleocin [Clindamycin Hcl]     Diarrhea     Consultations:  Gen. surgery.   Orthopedics. Dr. Lorin Mercy.   Procedures/Studies: Imaging Results   Mr Foot Left Wo Contrast  Result Date: 11/05/2015 CLINICAL DATA:  Diabetic patient with a history of multiple foot infections and amputations. Redness, pain and swelling of the left foot developing over the past 2 days. EXAM: MRI OF THE LEFT FOOT WITHOUT CONTRAST TECHNIQUE: Multiplanar, multisequence MR imaging was performed. No intravenous contrast was administered. COMPARISON:  MRI of the left foot 10/13/2014. FINDINGS: A fluid collection with septations and mixed signal intensity measuring 2.8 cm long by 1.2 cm transverse by 2.1 cm craniocaudal is seen at the level the calcaneocuboid joint. No other fluid collection is seen. Tendons are intact. Intrinsic musculature the foot is atrophied. Abnormal signal is seen in the distal tibia at the plafond. All imaged bones of the foot demonstrate areas of increased T2 signal. Although there is abnormal signal in the cuboid and fifth metatarsal, these bones are relatively spared. No fracture is identified. No evidence of arthropathy is seen. IMPRESSION: Subcutaneous fluid collection at the level the calcaneocuboid joint could be due to abscess or hematoma. It should be easily amenable to sampling. Marrow edema in all visualized bones the foot pain and the distal tibia is new since the prior MRI and nonspecific. Signal abnormality could be due to multifocal osteomyelitis or stress change. Electronically Signed   By: Inge Rise M.D.   On: 11/05/2015 12:42     (Echo, Carotid, EGD, Colonoscopy, ERCP)    Subjective:   Discharge Exam:       Vitals:   11/07/15 2140 11/08/15 0500  BP: (!) 178/88 (!) 157/88  Pulse: 75 77  Resp: 18 18  Temp: 98.4 F (36.9 C) 98.2 F (36.8 C)         Vitals:   11/07/15 0620 11/07/15 1358 11/07/15 2140 11/08/15 0500  BP: (!) 150/76 (!) 165/77 (!) 178/88 (!) 157/88  Pulse: 73 73 75 77  Resp: 16 18 18 18   Temp: 97.7 F (36.5 C) 98.2 F (36.8 C) 98.4 F (36.9 C) 98.2 F (36.8 C)  TempSrc: Oral  Oral Oral  SpO2: 99% 99% 99% 99%  Weight:      Height:        General: Pt is alert, awake, not in acute distress Cardiovascular: RRR, S1/S2 +, no rubs, no gallops Respiratory: CTA bilaterally, no wheezing, no rhonchi Abdominal: Soft, NT, ND, bowel sounds + Extremities: no edema, no cyanosis  The results of significant diagnostics from this hospitalization (including imaging, microbiology, ancillary and laboratory) are listed below for reference.     Microbiology:        Recent Results (from the past 240 hour(s))  Culture, blood (Routine X 2) w Reflex to ID Panel     Status: None   Collection Time: 11/03/15  3:25 PM  Result Value Ref Range Status   Specimen Description BLOOD RIGHT FOREARM  Final   Special Requests BOTTLES DRAWN AEROBIC AND ANAEROBIC 5CC EACH  Final   Culture   Final    NO GROWTH 5 DAYS Performed at [facility]   Report Status 11/08/2015 FINAL  Final  Culture, blood (Routine X 2) w Reflex to ID Panel     Status: None   Collection Time: 11/03/15  3:35 PM  Result Value Ref Range Status   Specimen Description BLOOD LEFT FOREARM  Final   Special Requests BOTTLES DRAWN AEROBIC AND ANAEROBIC 5CC EACH  Final   Culture   Final    NO GROWTH 5 DAYS Performed at [facility]   Report Status 11/08/2015 FINAL  Final  Culture, blood (routine x 2)     Status: None (Preliminary result)   Collection Time: 11/03/15  8:55 PM  Result Value Ref Range Status   Specimen Description BLOOD RIGHT HAND  Final   Special  Requests IN PEDIATRIC BOTTLE [place]  Final   Culture   Final    NO GROWTH 4 DAYS Performed at [facility]   Report Status PENDING  Incomplete  Culture, blood (routine x 2)     Status: None (Preliminary result)   Collection Time: 11/03/15  9:00 PM  Result Value Ref Range Status   Specimen Description BLOOD LEFT HAND  Final   Special Requests BOTTLES DRAWN AEROBIC ONLY 5CC  Final   Culture   Final    NO GROWTH 4 DAYS Performed at [facility]   Report Status PENDING  Incomplete  Aerobic Culture (superficial specimen)     Status: None (Preliminary result)   Collection Time: 11/06/15  6:27 PM  Result Value Ref Range Status   Specimen Description WOUND FOOT LEFT  Final   Special Requests Immunocompromised  Final   Gram Stain   Final    MODERATE WBC PRESENT,BOTH PMN AND MONONUCLEAR FEW GRAM POSITIVE COCCI IN PAIRS   Culture PENDING  Incomplete   Report Status PENDING  Incomplete     Labs: BNP (last 3 results) Recent Labs (within last 365 days)  No results for input(s): BNP in the last 8760 hours.   Basic Metabolic Panel:  Last Labs    Recent Labs Lab 11/03/15 1525 11/04/15 0504 11/07/15 1517  NA 130* 140 137  K 5.5* 4.6 5.0  CL 107 119* 115*  CO2 12* 14* 14*  GLUCOSE 332* 127* 203*  BUN 59* 50* 30*  CREATININE 3.06* 2.71* 1.90*  CALCIUM 8.0* 7.4* 7.9*     Liver Function Tests: Last Labs   No results for input(s): AST, ALT, ALKPHOS, BILITOT, PROT, ALBUMIN in the last 168 hours.   Last Labs   No results for input(s): LIPASE, AMYLASE in the last 168 hours.   Last Labs   No results for input(s): AMMONIA in the last 168 hours.   CBC:  Last Labs    Recent Labs Lab 11/03/15 1525 11/04/15 0504  WBC 6.8 4.9  NEUTROABS 4.8  --   HGB 11.1* 10.2*  HCT 32.9* 30.7*  MCV 81.6 82.3  PLT 457* 434*     Cardiac Enzymes: Last Labs   No results for input(s): CKTOTAL, CKMB, CKMBINDEX, TROPONINI in the last 168  hours.   BNP: Last Labs   Invalid input(s): POCBNP   CBG:  Last Labs    Recent Labs Lab 11/07/15 1758 11/07/15 2138 11/08/15 0138 11/08/15 0742 11/08/15 1200  GLUCAP 140* 84 129* 230* 361*     D-Dimer Recent Labs (last 2 labs)   No results for input(s): DDIMER in the last 72 hours.   Hgb A1c Recent Labs (last 2 labs)   No results for input(s): HGBA1C in the last 72 hours.   Lipid Profile Recent Labs (last 2 labs)   No results for input(s): CHOL, HDL, LDLCALC, TRIG, CHOLHDL, LDLDIRECT in the last 72 hours.   Thyroid function studies  Recent Labs (last 2 labs)   No results for input(s): TSH, T4TOTAL, T3FREE, THYROIDAB in the last 72 hours.  Invalid input(s): FREET3   Anemia work up [employer] (last 2 labs)   No results for input(s): VITAMINB12, FOLATE, FERRITIN, TIBC, IRON, RETICCTPCT in the last 72 hours.   Urinalysis Labs (Brief)          Component Value Date/Time   COLORURINE YELLOW 01/26/2015 1200   APPEARANCEUR CLOUDY (A) 01/26/2015 1200   LABSPEC >=1.030 (A) 01/26/2015 1200   PHURINE 5.5 01/26/2015 1200   GLUCOSEU 500 (A) 01/26/2015 1200   HGBUR SMALL (A) 01/26/2015 1200   BILIRUBINUR NEGATIVE 01/26/2015 1200   KETONESUR NEGATIVE 01/26/2015 1200   PROTEINUR 100 (A) 09/01/2014 1259   UROBILINOGEN 0.2 01/26/2015 1200   NITRITE POSITIVE (A) 01/26/2015 1200   LEUKOCYTESUR NEGATIVE 01/26/2015 1200     Sepsis Labs Last Labs   Invalid input(s): PROCALCITONIN,  WBC,  LACTICIDVEN   Microbiology        Recent Results (from the past 240 hour(s))  Culture, blood (Routine X 2) w Reflex to ID Panel     Status: None   Collection Time: 11/03/15  3:25 PM  Result Value Ref Range Status   Specimen Description BLOOD RIGHT FOREARM  Final   Special Requests BOTTLES DRAWN AEROBIC AND ANAEROBIC 5CC EACH  Final   Culture   Final    NO GROWTH 5 DAYS Performed at [facility]   Report Status 11/08/2015 FINAL  Final  Culture,  blood (Routine X 2) w Reflex to ID Panel     Status: None   Collection Time: 11/03/15  3:35 PM  Result Value Ref Range Status   Specimen Description BLOOD LEFT FOREARM  Final   Special Requests BOTTLES DRAWN AEROBIC AND ANAEROBIC 5CC EACH  Final   Culture   Final    NO GROWTH 5 DAYS Performed at [facility]   Report Status 11/08/2015 FINAL  Final  Culture, blood (routine x 2)     Status: None (Preliminary result)   Collection Time: 11/03/15  8:55 PM  Result Value Ref Range Status   Specimen Description BLOOD RIGHT HAND  Final   Special Requests IN PEDIATRIC BOTTLE [place]  Final   Culture   Final    NO GROWTH 4 DAYS Performed at [facility]   Report Status PENDING  Incomplete  Culture, blood (routine x 2)     Status: None (Preliminary result)   Collection Time: 11/03/15  9:00 PM  Result Value Ref Range Status   Specimen Description BLOOD LEFT HAND  Final   Special Requests BOTTLES DRAWN AEROBIC  ONLY 5CC  Final   Culture   Final    NO GROWTH 4 DAYS Performed at [facility]   Report Status PENDING  Incomplete  Aerobic Culture (superficial specimen)     Status: None (Preliminary result)   Collection Time: 11/06/15  6:27 PM  Result Value Ref Range Status   Specimen Description WOUND FOOT LEFT  Final   Special Requests Immunocompromised  Final   Gram Stain   Final    MODERATE WBC PRESENT,BOTH PMN AND MONONUCLEAR FEW GRAM POSITIVE COCCI IN PAIRS   Culture PENDING  Incomplete   Report Status PENDING  Incomplete     Time coordinating discharge: Over 30 minutes  SIGNED:   Birdie Hopes, MD                     [facility] 11/08/2015, 12:35 PM Pager   If 7PM-7AM, please contact night-coverage [email or web address] Password [email or web address]

## 2015-11-21 ENCOUNTER — Ambulatory Visit (INDEPENDENT_AMBULATORY_CARE_PROVIDER_SITE_OTHER): Payer: Medicare Other | Admitting: Podiatry

## 2015-11-21 ENCOUNTER — Encounter: Payer: Self-pay | Admitting: Podiatry

## 2015-11-21 ENCOUNTER — Ambulatory Visit: Payer: Medicare Other

## 2015-11-21 DIAGNOSIS — R52 Pain, unspecified: Secondary | ICD-10-CM

## 2015-11-21 DIAGNOSIS — Z899 Acquired absence of limb, unspecified: Secondary | ICD-10-CM | POA: Diagnosis not present

## 2015-11-21 DIAGNOSIS — M14672 Charcot's joint, left ankle and foot: Secondary | ICD-10-CM

## 2015-11-21 DIAGNOSIS — S92002S Unspecified fracture of left calcaneus, sequela: Secondary | ICD-10-CM | POA: Diagnosis not present

## 2015-11-22 ENCOUNTER — Encounter: Payer: Self-pay | Admitting: Podiatry

## 2015-11-22 NOTE — Progress Notes (Signed)
Subjective:     Patient ID: [name], female   DOB: 1971-01-20, 45 y.o.   MRN: [id number]  HPI 45 year old female presents the office today for which she states she does not know why she is here. She was told that she is to find a foot doctor. She states that she has Charcot of her left foot she's had multiple foot surgeries on both feet. On the right side she has has a transmetatarsal amputation. Left-sided she's had multiple foot surgeries also resulting in a fifth ray resection as well as apparent complaining of the cuboid. Also hallux amputation the left foot. She appears very frustrated today and does not want to be in the office and she states that she wants to get out very quickly today.  She has previously seen by Dr. Sharol Given and also by physicians at [facility]. She states that she was dismissed from the practice at [place]. She is also been seen by doctors  in [place] and [place] for this. She is also been in contact with the company in regards to a custom shoe and insert the left foot. She told me today that she does not like to listen to doctors and she does not want to be seen by foot doctor in [place].  Review of Systems  All other systems reviewed and are negative.      Objective:   Physical Exam General: AAO x3, NAD *Exam is limited as she would not thicker sock off on the right foot*  Dermatological: Incisional lateral aspect of the left foot appears well coapted how there is a central area appears bloody drainage in the superficial opening. There is not appear any probing, undermining or time. There is no edema, erythema, increase in warmth.   Vascular: Dorsalis Pedis artery and Posterior Tibial artery pedal pulses are palpable with immedate capillary fill time. There is no pain with calf compression, swelling, warmth, erythema.   Neruologic: Decreased sensation with Derrel Nip monofilament  Musculoskeletal: Equinus is present. She has difficulty in  dorsiflexion of her left foot. There is no specific area of tenderness. Transmetatarsal amputation apparent on the right foot.  Wearing a cam boot on the left foot. She states that she wears this herself for stability but nobody is managing her in the cam boot.     Assessment:     Multiple amputations, Charcot left foot    Plan:     -Treatment options discussed including all alternatives, risks, and complications -Etiology of symptoms were discussed -X-rays were obtained and reviewed with the patient. Radiolucent line in the posterior aspect of the calcaneus concerning for possible fracture. Osteopenia is present.. -At this time I recommended MRI for left foot however she states she just had one done. On when to try to obtain records. Now continue to wear the CAM boot. She'll likely need have some chondral brace and/or custom insert on the left foot as well as an insert on the right to the toe filler. We'll be in contact once every see the records. I discussed their daily foot inspection of ears any problems or concerns to call immediately for follow-up.  Celesta Gentile, DPM

## 2015-12-02 ENCOUNTER — Other Ambulatory Visit (INDEPENDENT_AMBULATORY_CARE_PROVIDER_SITE_OTHER): Payer: Medicare Other

## 2015-12-02 DIAGNOSIS — E1065 Type 1 diabetes mellitus with hyperglycemia: Secondary | ICD-10-CM | POA: Diagnosis not present

## 2015-12-02 DIAGNOSIS — N183 Chronic kidney disease, stage 3 (moderate): Secondary | ICD-10-CM

## 2015-12-02 LAB — MICROALBUMIN / CREATININE URINE RATIO
CREATININE, U: 53.4 mg/dL
MICROALB UR: 89.4 mg/dL — AB (ref 0.0–1.9)
MICROALB/CREAT RATIO: 167.4 mg/g — AB (ref 0.0–30.0)

## 2015-12-02 LAB — LIPID PANEL
CHOL/HDL RATIO: 7
Cholesterol: 182 mg/dL (ref 0–200)
HDL: 27.8 mg/dL — AB (ref >39.00)
LDL Cholesterol: 117 mg/dL — ABNORMAL HIGH (ref 0–99)
NONHDL: 154.61
TRIGLYCERIDES: 186 mg/dL — AB (ref 0.0–149.0)
VLDL: 37.2 mg/dL (ref 0.0–40.0)

## 2015-12-02 LAB — BASIC METABOLIC PANEL
BUN: 57 mg/dL — AB (ref 6–23)
CHLORIDE: 109 meq/L (ref 96–112)
CO2: 19 meq/L (ref 19–32)
CREATININE: 1.96 mg/dL — AB (ref 0.40–1.20)
Calcium: 7.8 mg/dL — ABNORMAL LOW (ref 8.4–10.5)
GFR: 29.44 mL/min — ABNORMAL LOW (ref >60.00)
Glucose, Bld: 372 mg/dL — ABNORMAL HIGH (ref 70–99)
POTASSIUM: 5.2 meq/L — AB (ref 3.5–5.1)
Sodium: 135 mEq/L (ref 135–145)

## 2015-12-03 LAB — FRUCTOSAMINE: Fructosamine: 414 umol/L — ABNORMAL HIGH (ref 0–285)

## 2015-12-03 LAB — PARATHYROID HORMONE, INTACT (NO CA): PTH: 227 pg/mL — ABNORMAL HIGH (ref 15–65)

## 2015-12-07 ENCOUNTER — Encounter: Payer: Self-pay | Admitting: Endocrinology

## 2015-12-07 ENCOUNTER — Other Ambulatory Visit: Payer: Self-pay | Admitting: *Deleted

## 2015-12-07 ENCOUNTER — Ambulatory Visit (INDEPENDENT_AMBULATORY_CARE_PROVIDER_SITE_OTHER): Payer: Medicare Other | Admitting: Endocrinology

## 2015-12-07 VITALS — BP 118/78 | HR 92 | Temp 97.8°F | Resp 14 | Ht 67.0 in | Wt 137.6 lb

## 2015-12-07 DIAGNOSIS — E1065 Type 1 diabetes mellitus with hyperglycemia: Secondary | ICD-10-CM | POA: Diagnosis not present

## 2015-12-07 MED ORDER — PRAVASTATIN SODIUM 40 MG PO TABS: 40.0000 mg | ORAL_TABLET | Freq: Every day | ORAL | 1 refills | Status: DC

## 2015-12-07 MED ORDER — GLUCOSE BLOOD VI STRP: ORAL_STRIP | 3 refills | Status: DC

## 2015-12-07 NOTE — Patient Instructions (Signed)
Low potassium diet  Stop all juices and hi sugar fruits  Check 4x daily  Calcitriol daily

## 2015-12-07 NOTE — Progress Notes (Signed)
Patient ID: [name], female   DOB: Dec 19, 1970, 45 y.o.   MRN: [id number]   Reason for Appointment: follow-up of diabetes  History of Present Illness   Diagnosis: Type 1 DIABETES MELITUS  Previous history: She has had long-standing poorly controlled diabetes and typically has poor compliance with self care measures despite periodic diabetes education and periodic followup  INSULIN PUMP regimen:   BASAL = Midnight = 0.3., 2 PM = 0.5, 5 PM = 0.6  Carb coverage 1: 15 g and correction 1: 60 with target 120, active insulin 4 hours         Recent history:   Her A1c last month was better than usual at 8.4%, previously usually over 9% However has not been seen in follow-up since January   Fructosamine indicates overall high readings  Current blood sugar patterns, difficulties with management and problems identified:  She is checking her blood sugars very sporadically and now she thinks this is because her insulin pump supplier will not cover the test strips.  She says for the last 2-3 weeks she has been getting more food available and she is eating at will.  Also eating large amounts of fruits along with drinking juice  She will not BOLUS right at the mealtime frequently and may do it only later when the blood sugar goes up  OVERNIGHT blood sugars are fairly consistently over 250  Overall average blood sugar is nearly 400  Does not usually checked readings after doing a correction  Frequently not checking blood sugars right at her mealtimes when she is entering carbohydrate  Mean values apply above for all meters except median for One Touch  PRE-MEAL Fasting Lunch Dinner Bedtime Overall  Glucose range: 2 45-400+  340-400+  ?   252-400+    Mean/median:     389    Meals: eating at variable times and also having snacks periodically.  Her daily carbohydrate intake is quite variable Physical activity: exercise: none         Dietician visit: Most recent:?   Complications: are: Neuropathy, nephropathy, diabetic foot ulcer      Wt Readings from Last 3 Encounters:  12/07/15 137 lb 9.6 oz (62.4 kg)  11/03/15 130 lb (59 kg)  04/15/15 125 lb (56.7 kg)   Lab Results  Component Value Date   HGBA1C 8.4 (H) 11/04/2015   HGBA1C 9.1 (H) 04/15/2015   HGBA1C 9.3 01/26/2015   Lab Results  Component Value Date   MICROALBUR 89.4 (H) 12/02/2015   LDLCALC 117 (H) 12/02/2015   CREATININE 1.96 (H) 12/02/2015        Medication List       Accurate as of 12/07/15 11:51 AM. Always use your most recent med list.          albuterol 108 (90 Base) MCG/ACT inhaler Commonly known as:  PROVENTIL HFA;VENTOLIN HFA Inhale 2 puffs into the lungs every 6 (six) hours as needed. Reported on 04/15/2015   amLODipine 10 MG tablet Commonly known as:  NORVASC Take 10 mg by mouth daily.   calcitRIOL 0.25 MCG capsule Commonly known as:  ROCALTROL Take 0.25 mcg by mouth every Monday, Wednesday, and Friday. Reported on 04/15/2015   ferrous sulfate 325 (65 FE) MG tablet Take 1 tablet (325 mg total) by mouth 3 (three) times daily with meals. Reported on [id number]   folic acid 1 MG tablet Commonly known as:  FOLVITE Take 1 mg by mouth daily. Reported on 04/15/2015   furosemide  40 MG tablet Commonly known as:  LASIX Take 40 mg by mouth daily as needed.   gabapentin 300 MG capsule Commonly known as:  NEURONTIN Take 300 mg by mouth every 12 (twelve) hours as needed.   gabapentin 100 MG capsule Commonly known as:  NEURONTIN Take 1 capsule (100 mg total) by mouth 3 (three) times daily.   HYDROcodone-acetaminophen 10-325 MG tablet Commonly known as:  NORCO Take 1 tablet by mouth every 6 (six) hours as needed.   insulin lispro 100 UNIT/ML injection Commonly known as:  HUMALOG Inject into the skin 3 (three) times daily before meals. Per sliding scale   mupirocin ointment 2 % Commonly known as:  BACTROBAN Place 1 application into the nose 2 (two) times  daily.   norethindrone 5 MG tablet Commonly known as:  AYGESTIN Take 1 tablet by mouth daily.   NOVOLOG 100 UNIT/ML injection Generic drug:  insulin aspart USE MAX 65 UNITS PER DAY WITH PUMP   pravastatin 20 MG tablet Commonly known as:  PRAVACHOL Take 1 tablet by mouth daily.   pyridoxine 100 MG tablet Commonly known as:  B-6 Take 100 mg by mouth daily. Reported on 04/15/2015   vitamin B-12 100 MCG tablet Commonly known as:  CYANOCOBALAMIN Take 200 mcg by mouth daily. Reported on 04/15/2015   Vitamin D (Ergocalciferol) 50000 units Caps capsule Commonly known as:  DRISDOL Take 1 capsule (50,000 Units total) by mouth every 7 (seven) days.       Allergies:  Allergies  Allergen Reactions  . Lisinopril Other (See Comments)    Elevated potassium per pt report  . Ciprofloxacin Itching  . Cleocin [Clindamycin Hcl]     Diarrhea     Past Medical History:  Diagnosis Date  . Anemia   . C. difficile diarrhea 09/26/2014  . Cataracts, both eyes   . Cellulitis of right foot 06/02/2014  . CKD (chronic kidney disease) stage 3, GFR 30-59 ml/min   . Diabetic ulcer of right foot ([place]) 06/02/2014  . DVT (deep venous thrombosis) ([place]) 10/2014  . H/O seasonal allergies   . Hypercholesteremia   . Hypertension   . Left foot infection   . Neuropathy in diabetes ([place])   . Staphylococcus aureus bacteremia 10/2014  . Type 1 diabetes ([place])    onset age 66    Past Surgical History:  Procedure Laterality Date  . CESAREAN SECTION    . EYE SURGERY    . FOOT AMPUTATION THROUGH METATARSAL Left   . hemrrhoidectomy    . I&D EXTREMITY Right 06/10/2014   Procedure: IRRIGATION AND DEBRIDEMENT Right Foot;  Surgeon: Newt Minion, MD;  Location: [facility];  Service: Orthopedics;  Laterality: Right;  . PERIPHERALLY INSERTED CENTRAL CATHETER INSERTION    . SKIN SPLIT GRAFT Right 06/15/2014   Procedure: SPLIT THICKNESS SKIN GRAFT RIGHT FOOT;  Surgeon: Newt Minion, MD;  Location: [facility];  Service:  Orthopedics;  Laterality: Right;  . TRANSMETATARSAL AMPUTATION Right     Family History  Problem Relation Age of Onset  . Hyperlipidemia Mother   . Dementia Mother     Social History:  reports that she quit smoking about 7 years ago. She has never used smokeless tobacco. She reports that she drinks about 0.5 oz of alcohol per week . She reports that she does not use drugs.  Review of Systems:  Hypertension/CKD:   Her last creatinine is as follows: Overall stable Blood pressure is quite normal without any medications  Potassium is  high, she is consuming a lot of high potassium foods like bananas and drinking juice  Lab Results  Component Value Date   CREATININE 1.96 (H) 12/02/2015   BUN 57 (H) 12/02/2015   NA 135 12/02/2015   K 5.2 (H) 12/02/2015   CL 109 12/02/2015   CO2 19 12/02/2015     Lipids: Has been  prescribed pravastatin, She thinks she is taking this regularly but her LDL is over 100   Lab Results  Component Value Date   CHOL 182 12/02/2015   HDL 27.80 (L) 12/02/2015   LDLCALC 117 (H) 12/02/2015   LDLDIRECT 112.0 07/05/2014   TRIG 186.0 (H) 12/02/2015   CHOLHDL 7 12/02/2015     She has been treated by nephrologist for anemia and has required iron infusion but has not gone for follow-up   Lab Results  Component Value Date   WBC 4.9 11/04/2015   HGB 10.2 (L) 11/04/2015   HCT 30.7 (L) 11/04/2015   MCV 82.3 11/04/2015   PLT 434 (H) 11/04/2015   She has a Charcot foot  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     ASSESSMENT/ PLAN:   Diabetes type 1 with poor control and multiple complications    Her diabetes is persistently poorly controlled mostly from noncompliance  especially recently  See history of present illness for details of her blood sugar patterns, current management and problems identified  She is again not motivated and totally going off her diet recently Compliance with glucose monitoring, boluses and types of meals is poor.  DIABETIC nephropathy with nephrotic syndrome: Recommend to set up follow-up with nephrologist PTH level is significantly high and she is taking calcitriol 3 times a week  Lipids are not is well-controlled with 20 mg pravastatin   PLAN:    Basal rate 0.4 at midnight  Also she needs to be consistent with eating balanced meals and snacks and bolusing for all carbohydrate intake.  She needs to check her sugar at the time of her meal   If the blood sugar is high she needs to do a follow-up reading after doing a correction  She will try to cut the test strips from the local drugstore  Avoid juices and a lot of fruit  She also needs to change her infusion sets consistently  Review level of control in 1 month  Until seen by nephrologist increase calcitriol to daily  Counseling time on subjects discussed above is over 50% of today's 25 minute visit  Patient Instructions  Low potassium diet  Stop all juices and hi sugar fruits  Check 4x daily  Calcitriol daily    Taetum Flewellen 12/07/2015, 11:51 AM    addendum: Vitamin D level is 10.4, start 50,000 units weekly

## 2015-12-08 ENCOUNTER — Ambulatory Visit: Payer: Medicare Other | Admitting: Endocrinology

## 2015-12-23 ENCOUNTER — Telehealth: Payer: Self-pay | Admitting: Endocrinology

## 2015-12-30 ENCOUNTER — Telehealth: Payer: Self-pay | Admitting: Endocrinology

## 2015-12-30 NOTE — Telephone Encounter (Signed)
Helene Kelp from [facility] calling on the status of patient Diabetic packet. Please advise  [phone]

## 2015-12-30 NOTE — Telephone Encounter (Signed)
[facility] is calling on the status of patient Diabetic packet. Please advise [phone]

## 2016-01-05 NOTE — Telephone Encounter (Signed)
This has been sent back to them several times already.

## 2016-01-09 ENCOUNTER — Other Ambulatory Visit: Payer: Self-pay | Admitting: *Deleted

## 2016-01-09 MED ORDER — PRAVASTATIN SODIUM 40 MG PO TABS: 40.0000 mg | ORAL_TABLET | Freq: Every day | ORAL | 0 refills | Status: DC

## 2016-01-16 ENCOUNTER — Other Ambulatory Visit: Payer: Medicare Other

## 2016-01-19 ENCOUNTER — Ambulatory Visit: Payer: Medicare Other | Admitting: Endocrinology

## 2016-01-19 ENCOUNTER — Encounter: Payer: Self-pay | Admitting: *Deleted

## 2016-01-24 NOTE — Telephone Encounter (Signed)
Patient needs appointment before Dr. Dwyane Dee said he would fill out the form

## 2016-01-24 NOTE — Telephone Encounter (Signed)
Patient is calling on the status of the brace for her foot from  [facility]. Please advise

## 2016-01-27 ENCOUNTER — Other Ambulatory Visit: Payer: Self-pay | Admitting: Endocrinology

## 2016-01-27 NOTE — Telephone Encounter (Signed)
Patient requesting Humalog, Novolog is on patient med list. Deny or refill? Please advise. Thank you!

## 2016-01-28 IMAGING — DX DG FOOT COMPLETE 3+V*L*
1 series · 1 of 1 positions shown · non-contrast
Comparison: 05/26/2012

CLINICAL DATA: Diabetic foot ulcer

EXAM:
LEFT FOOT - COMPLETE 3+ VIEW

[foot lat]
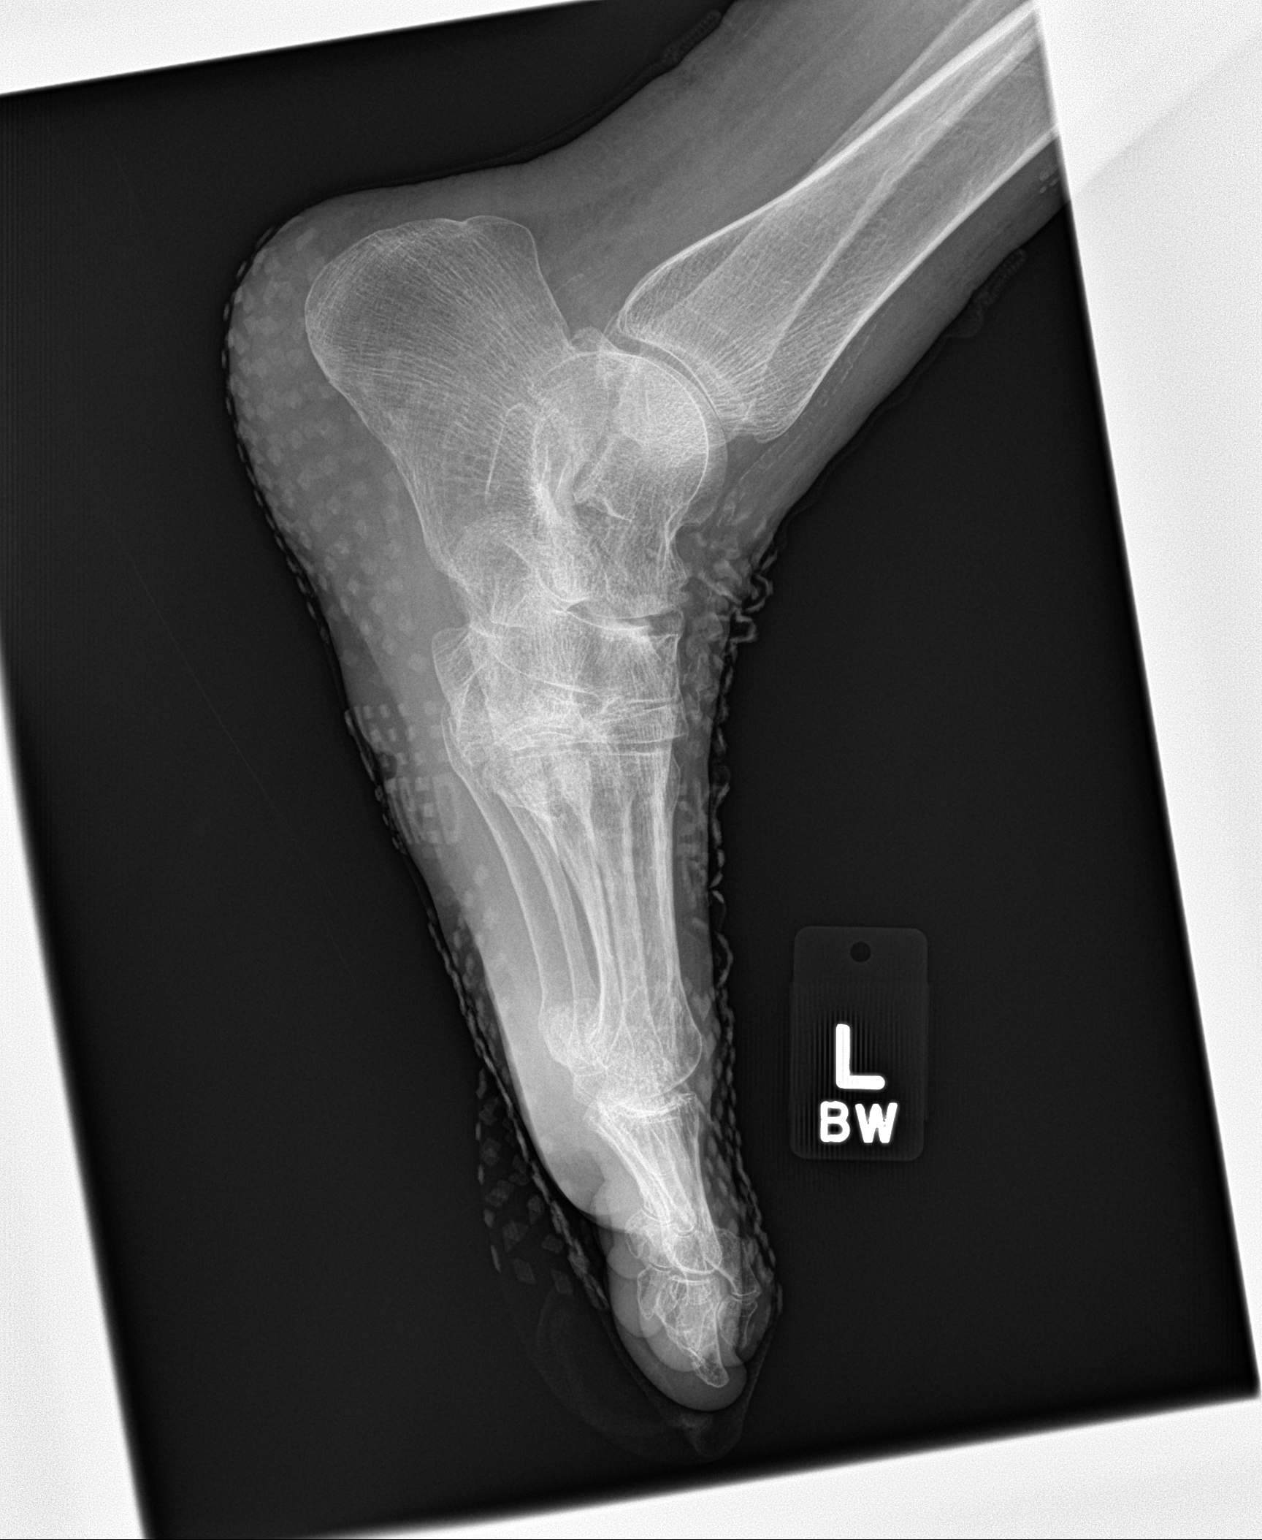

[1 of 1 positions shown; findings below may reference images not displayed]

FINDINGS: Three views of the left foot submitted. There is diffuse osteopenia.
No acute fracture or subluxation. No bone destruction to suggest
osteomyelitis.
IMPRESSION: No acute fracture or subluxation. Diffuse osteopenia. No definite
evidence of osteomyelitis

## 2016-01-28 IMAGING — DX DG FOOT COMPLETE 3+V*L*
1 series · 1 of 1 positions shown · non-contrast
Comparison: 05/26/2012

CLINICAL DATA: Diabetic foot ulcer

EXAM:
LEFT FOOT - COMPLETE 3+ VIEW

[foot ap]
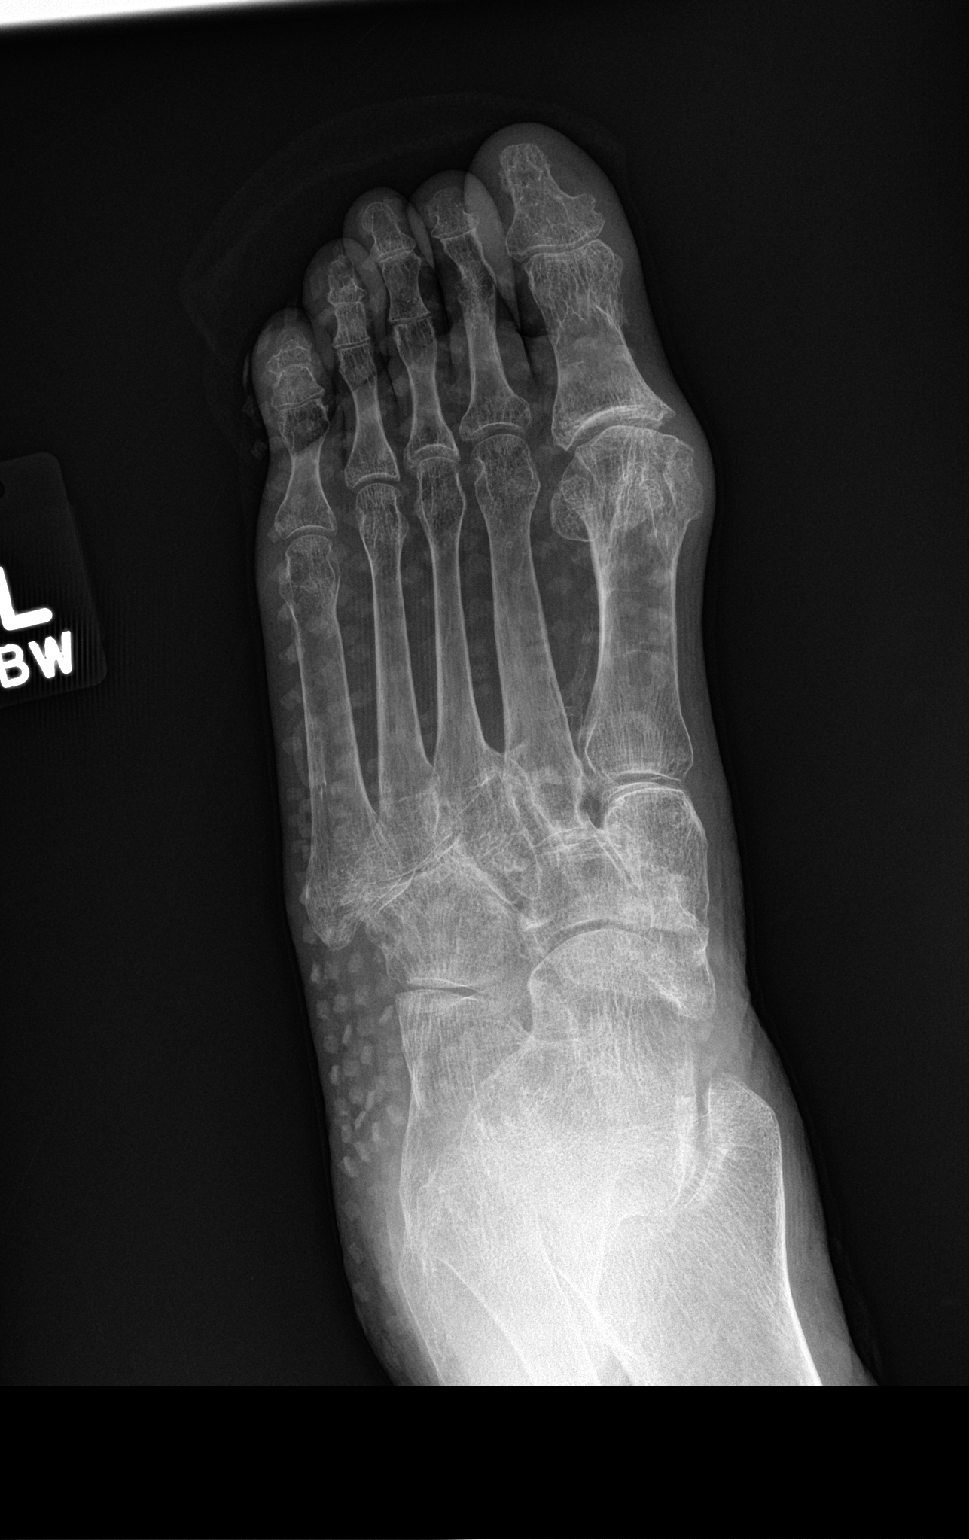

[1 of 1 positions shown; findings below may reference images not displayed]

FINDINGS: Three views of the left foot submitted. There is diffuse osteopenia.
No acute fracture or subluxation. No bone destruction to suggest
osteomyelitis.
IMPRESSION: No acute fracture or subluxation. Diffuse osteopenia. No definite
evidence of osteomyelitis

## 2016-01-28 IMAGING — DX DG FOOT COMPLETE 3+V*R*
3 series · 3 of 3 positions shown · non-contrast
Comparison: 06/02/2014

CLINICAL DATA: Diabetic foot ulcer

EXAM:
RIGHT FOOT COMPLETE - 3+ VIEW

[foot ap]
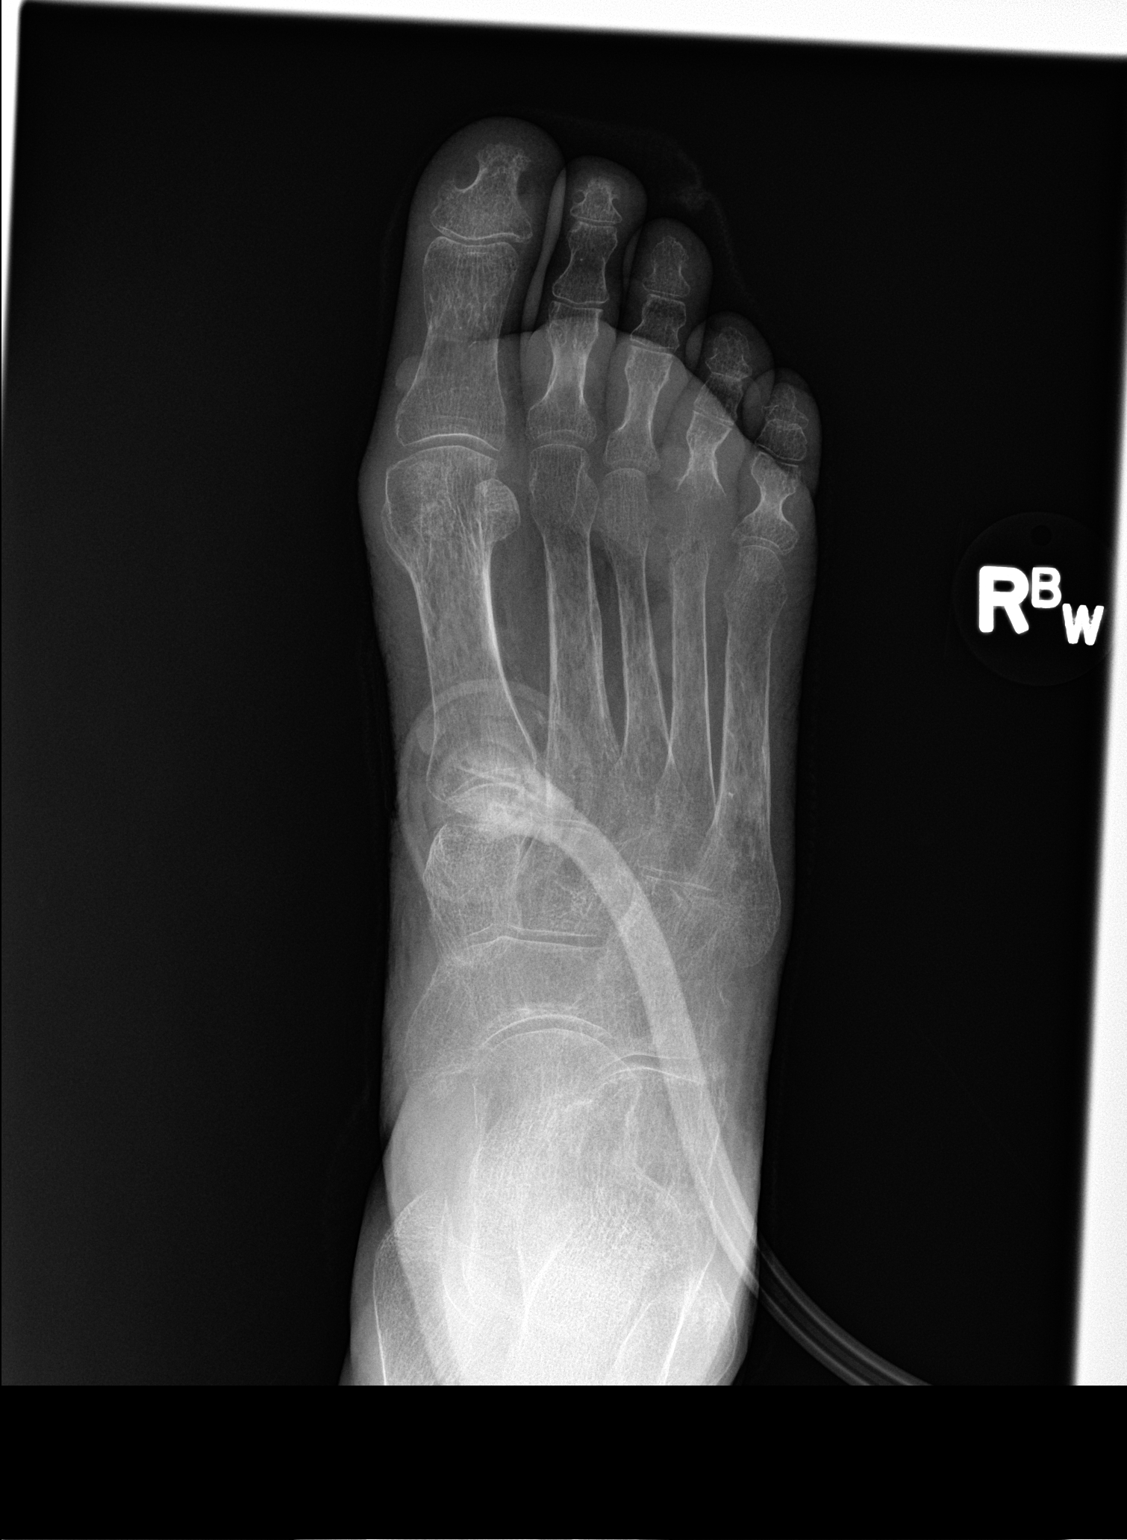

[foot lat]
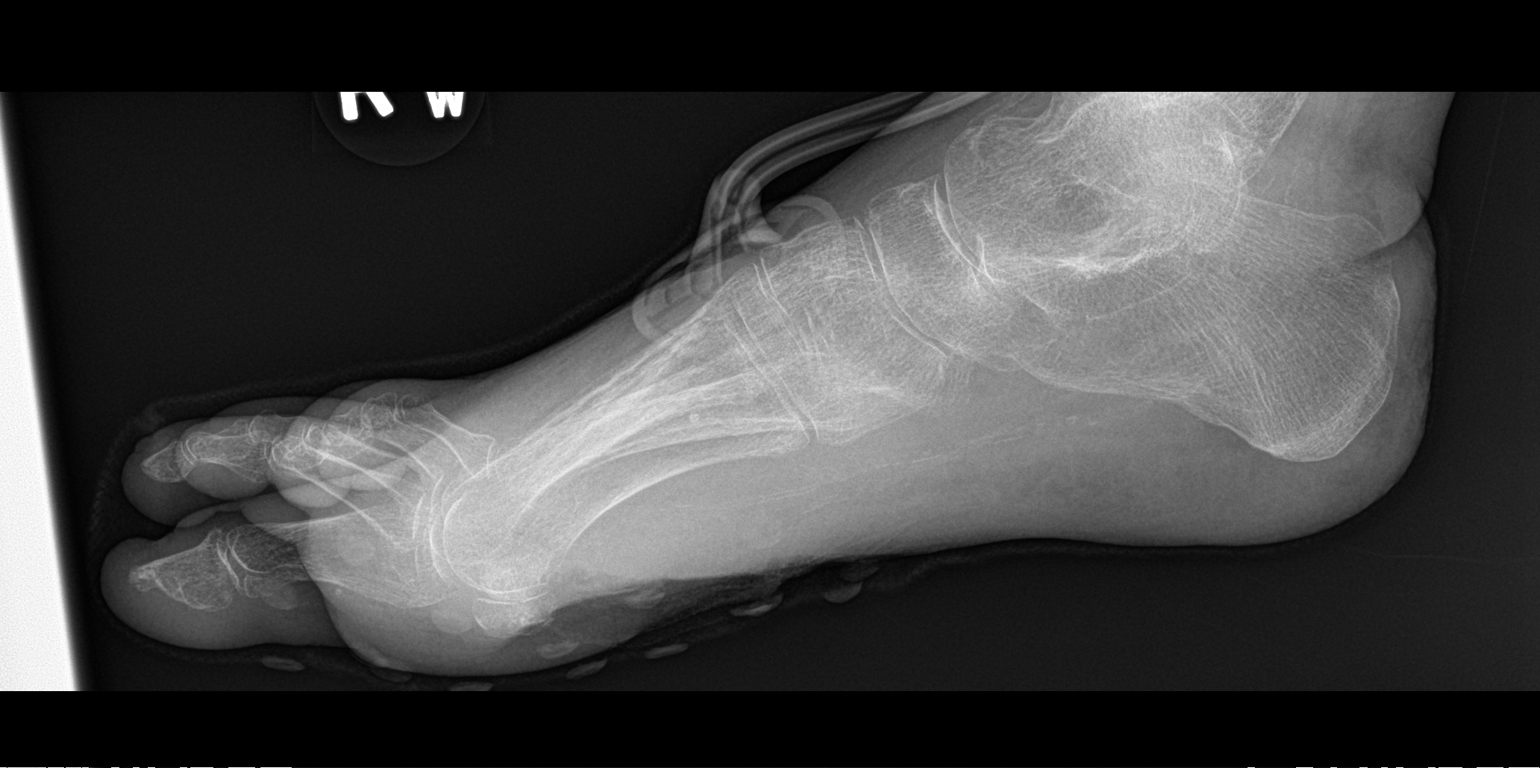

[foot obl]
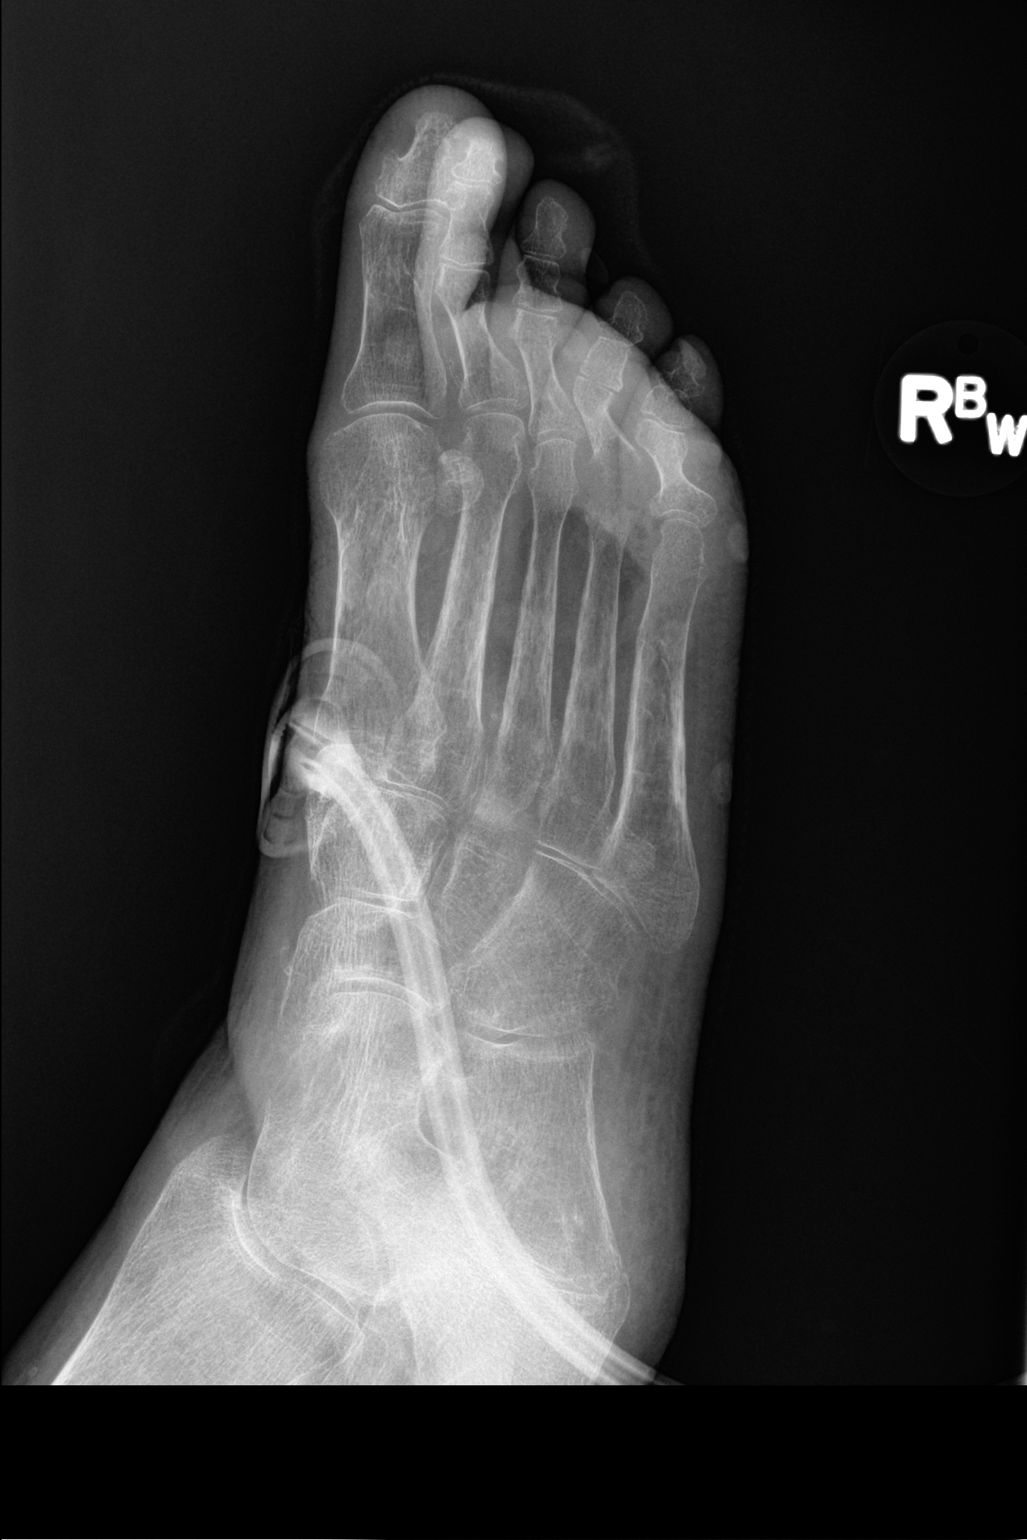

[3 of 3 positions shown; findings below may reference images not displayed]

FINDINGS: Three views of the right foot submitted. There is diffuse
osteopenia. There is osteolysis and cortical loss distal aspect of
fourth metatarsal and base of proximal phalanx fourth toe. Findings
are highly suspicious for osteomyelitis. Clinical correlation is
necessary. Further correlation with MRI is recommended.
IMPRESSION: There is diffuse osteopenia. There is osteolysis and cortical loss
distal aspect of fourth metatarsal and base of proximal phalanx
fourth toe. Findings are highly suspicious for osteomyelitis.
Clinical correlation is necessary. Further correlation with MRI is
recommended.

## 2016-01-29 NOTE — Telephone Encounter (Signed)
She is on an insulin pump with NovoLog, please deny

## 2016-01-30 ENCOUNTER — Other Ambulatory Visit: Payer: Self-pay

## 2016-01-30 IMAGING — MR MR FOOT*R* W/O CM
4 of 6 series · 19 of 40 positions shown · non-contrast
Comparison: Radiographs 09/02/2014

CLINICAL DATA: Open wound on the plantar aspect of the foot and
osteomyelitis suggested on recent plain films.

EXAM:
MRI OF THE RIGHT FOREFOOT WITHOUT CONTRAST
TECHNIQUE: Multiplanar, multisequence MR imaging was performed. No intravenous
contrast was administered.

[Series 3: T1 · coronal · 4.0mm · 0.29mm/px · 9 of 34 slices shown (1 of 2)]
[im 1/34]
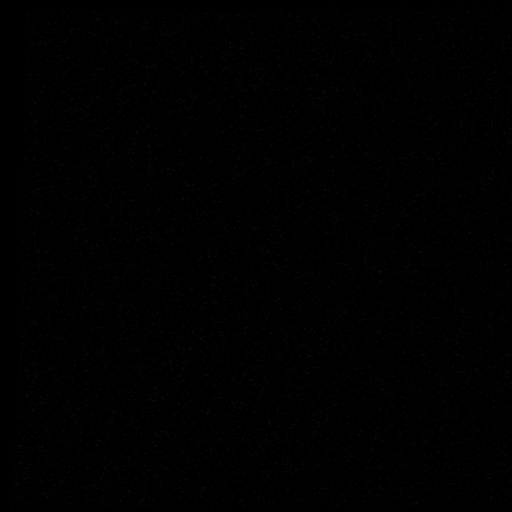
[im 5/34]
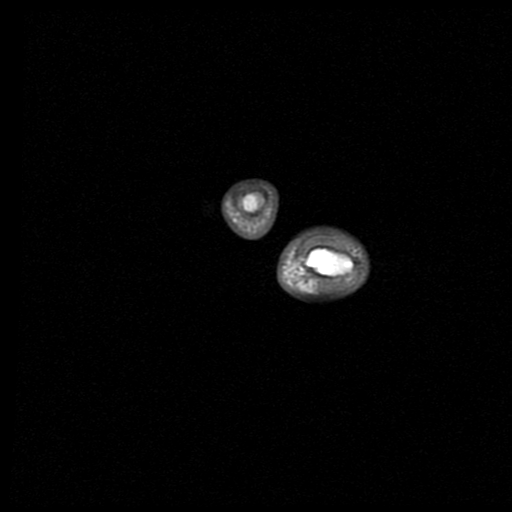
[im 9/34]
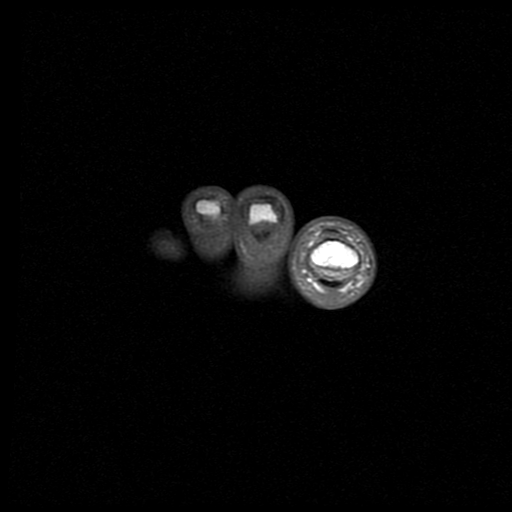
[im 13/34]
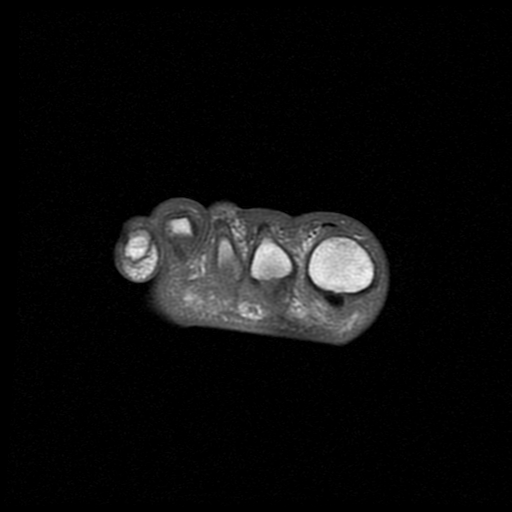
[im 17/34]
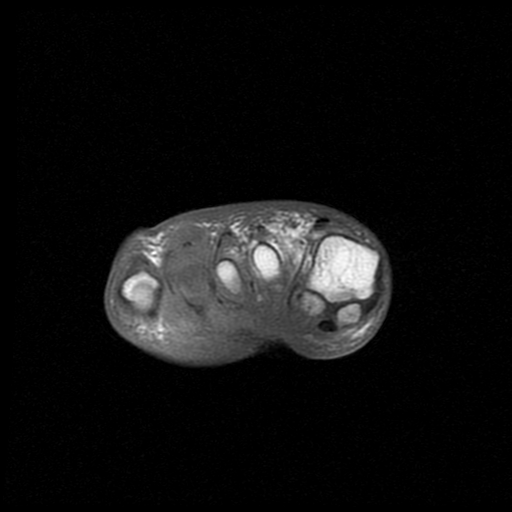
[im 21/34]
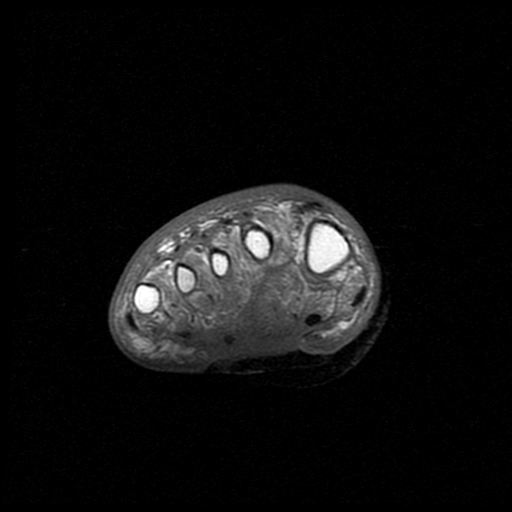
[im 25/34]
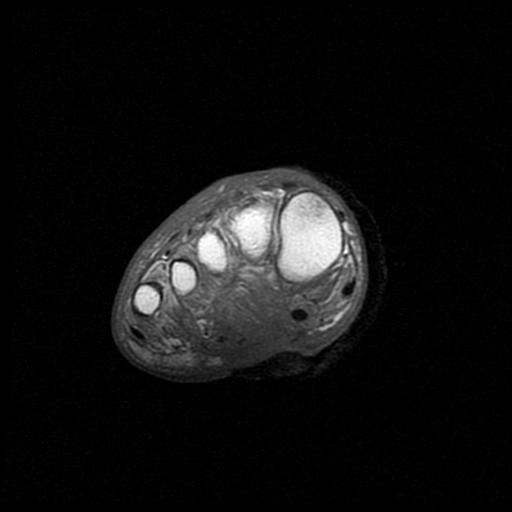
[im 29/34]
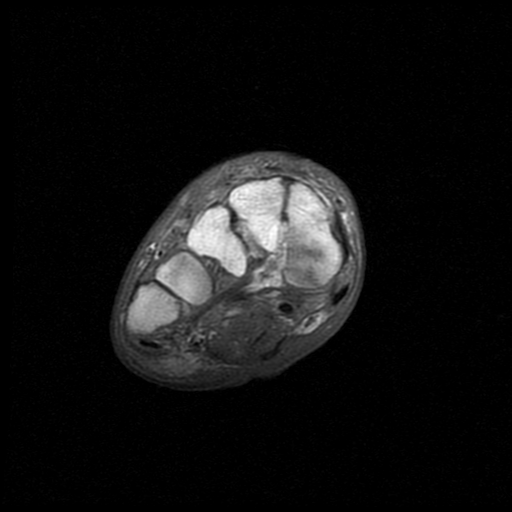
[im 34/34]
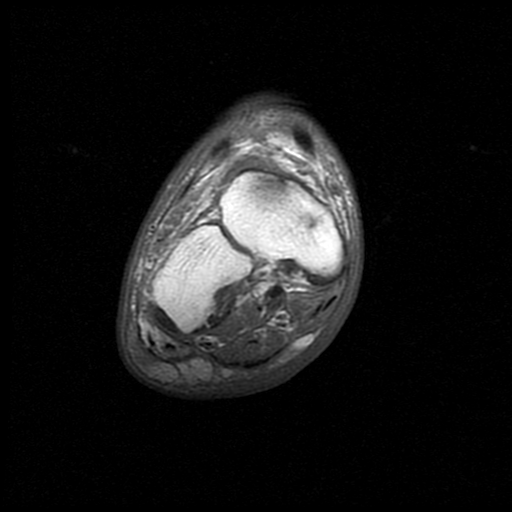

[Series 4: T2 · coronal · 4.0mm · 0.29mm/px · 3 of 34 slices shown (1 of 2)]
[im 5/34]
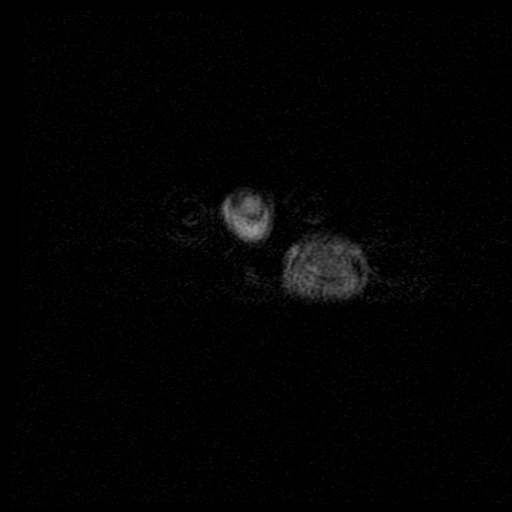
[im 17/34]
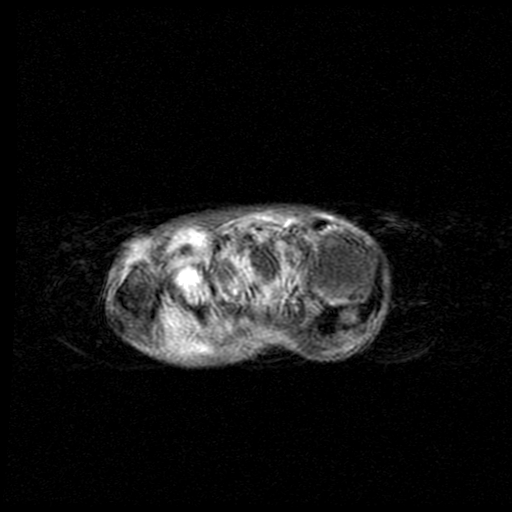
[im 29/34]
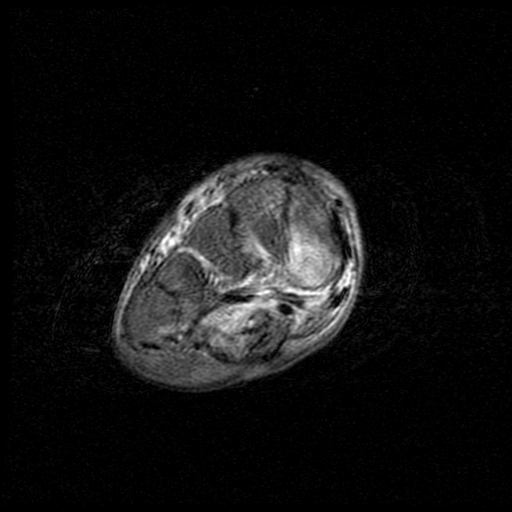

[Series 5: T1 · axial · 4.0mm · 0.29mm/px · z∈[-93,-22]mm · 4 of 16 slices shown (2 of 2)]
[im 1/16]
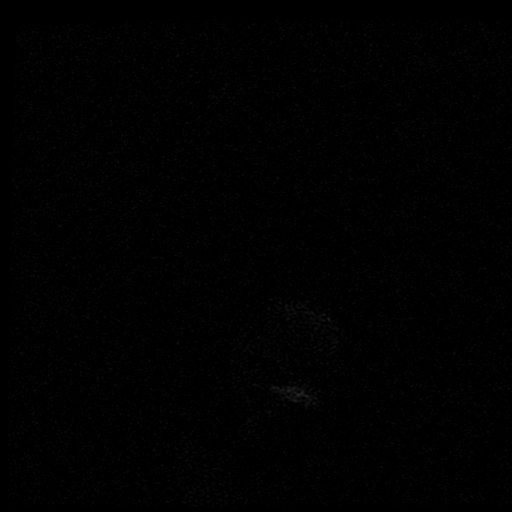
[im 6/16]
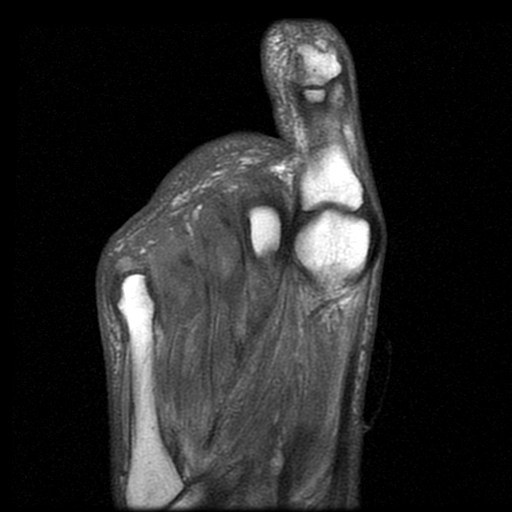
[im 11/16]
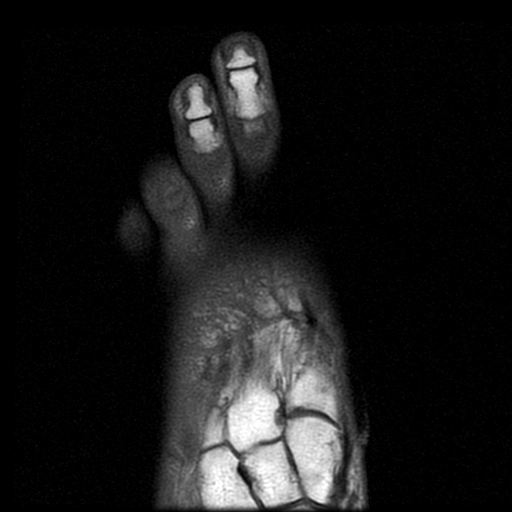
[im 16/16]
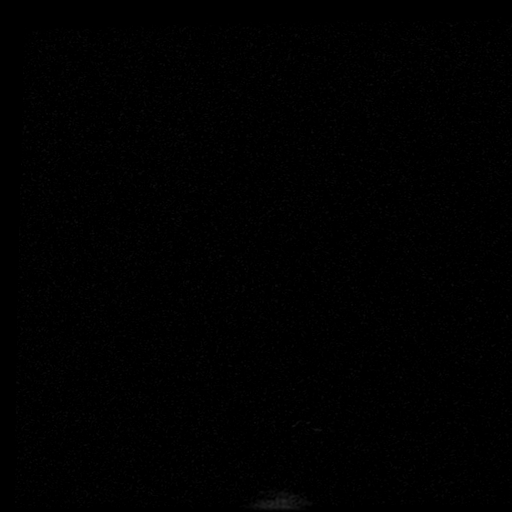

[Series 6: T2 · coronal · 4.0mm · 0.29mm/px · 3 of 34 slices shown (2 of 2)]
[im 5/34]
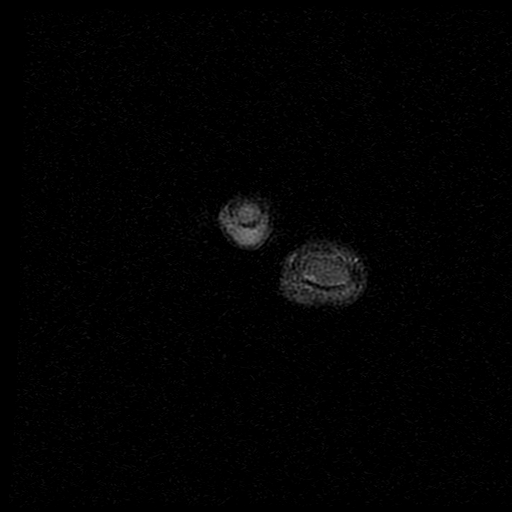
[im 17/34]
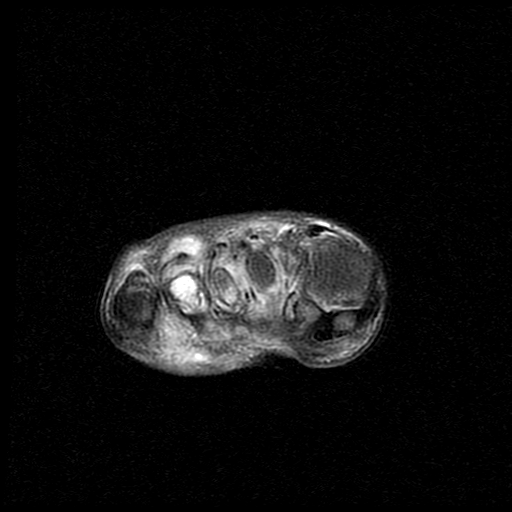
[im 29/34]
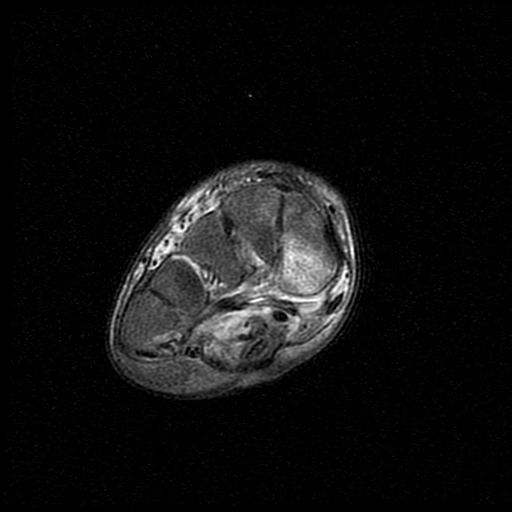

[19 of 40 positions shown; findings below may reference images not displayed]

FINDINGS: As demonstrated on the radiographs the fourth metatarsal head and
fourth proximal phalanx are destroyed by infection. There is
significant marrow edema in and around the fourth metatarsal
phalangeal joint and adjacent bones. Septic arthritis and
osteoarthritis the most likely cause.

There is also septic arthritis and osteomyelitis involving the third
metatarsal phalangeal joint and adjacent bones. The other metatarsal
phalangeal joints and bony structures are intact.

There is a large open wound on the plantar aspect of the foot with
severe cellulitis and myositis. Probable abscess surrounding the
fourth metatarsal phalangeal joint.
IMPRESSION: MR findings consistent with septic arthritis and osteomyelitis
involving the third and fourth metatarsal phalangeal joints as
described above.

Open wound on the plantar aspect of the foot with diffuse cellulitis
and myositis. Probable abscess surrounding the fourth metatarsal
phalangeal joint.

## 2016-01-30 IMAGING — MR MR FOOT*L* W/O CM
4 of 5 series · 19 of 40 positions shown · non-contrast
Comparison: Radiograph 09/02/2014

CLINICAL DATA: Diabetic foot ulcer on the plantar aspect of the
foot near the base of the fifth metatarsal.

EXAM:
MRI OF THE LEFT FOREFOOT WITHOUT CONTRAST
TECHNIQUE: Multiplanar, multisequence MR imaging was performed. No intravenous
contrast was administered.

[Series 2: T1 · coronal · 4.0mm · 0.29mm/px · 9 of 33 slices shown (1 of 2)]
[im 1/33]
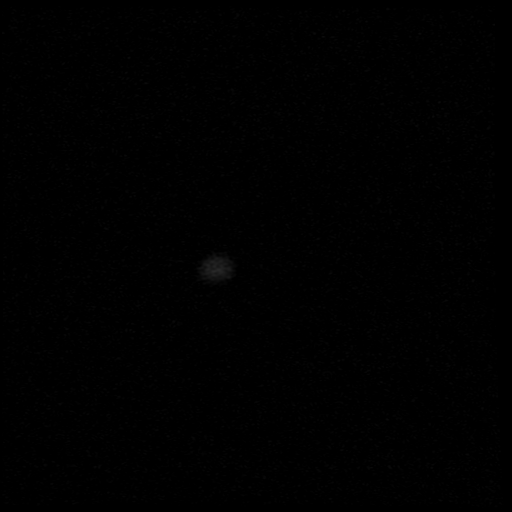
[im 6/33]
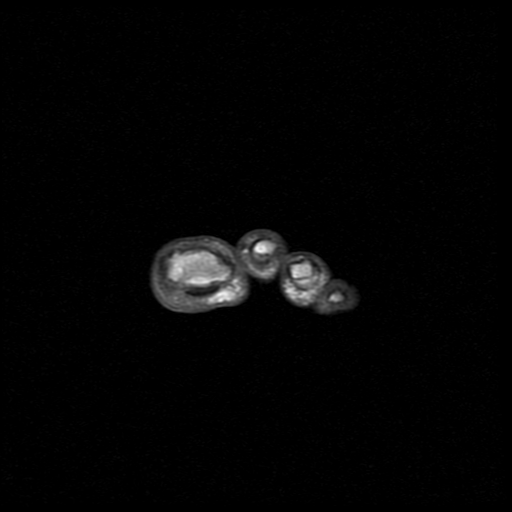
[im 9/33]
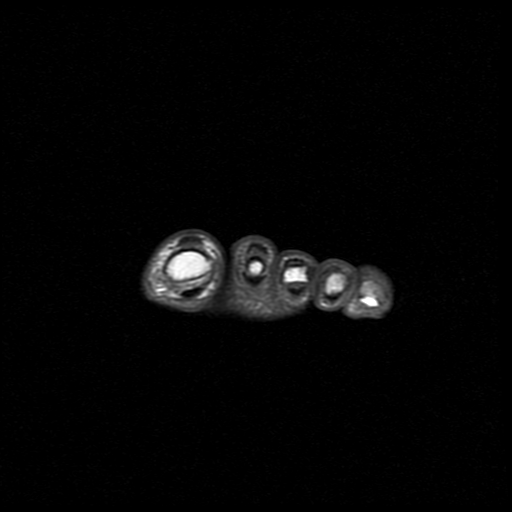
[im 15/33]
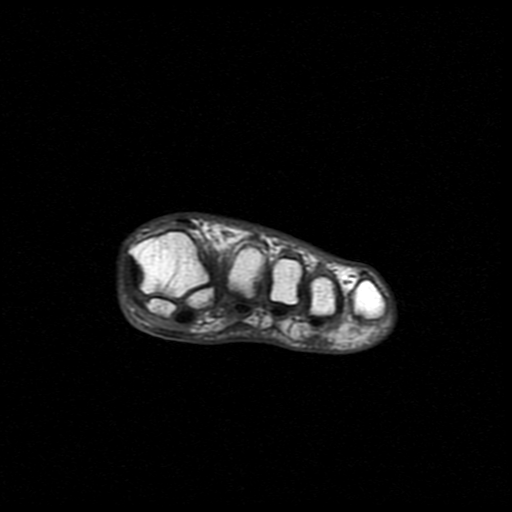
[im 18/33]
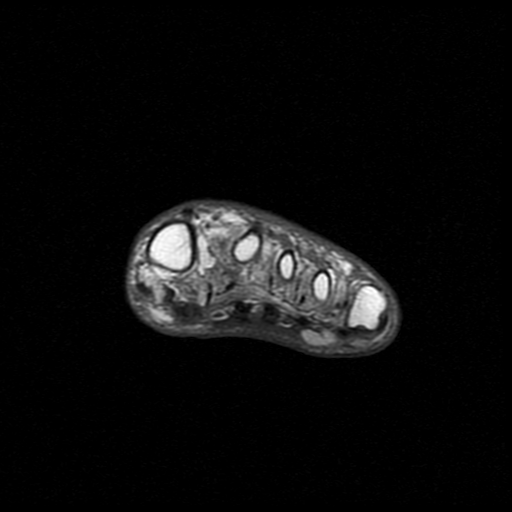
[im 24/33]
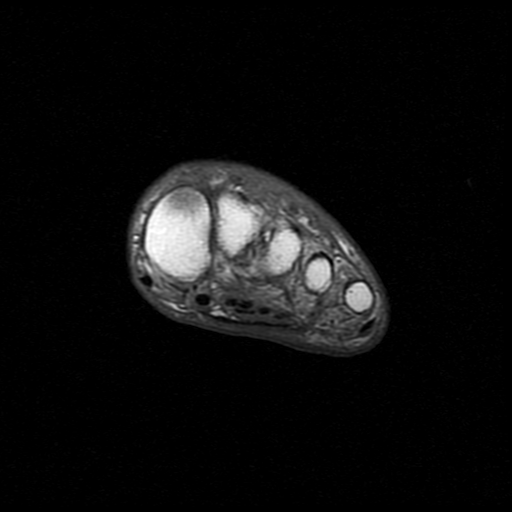
[im 27/33]
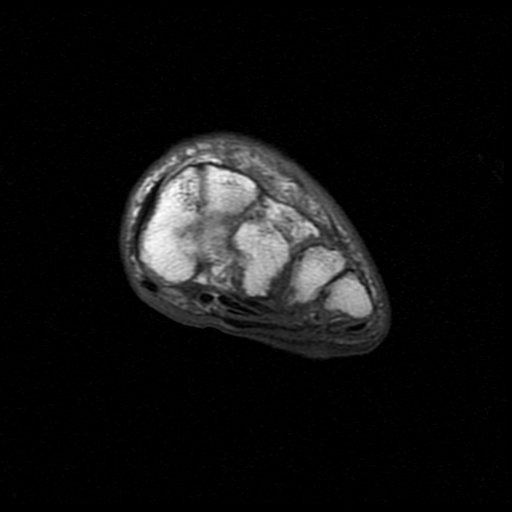
[im 30/33]
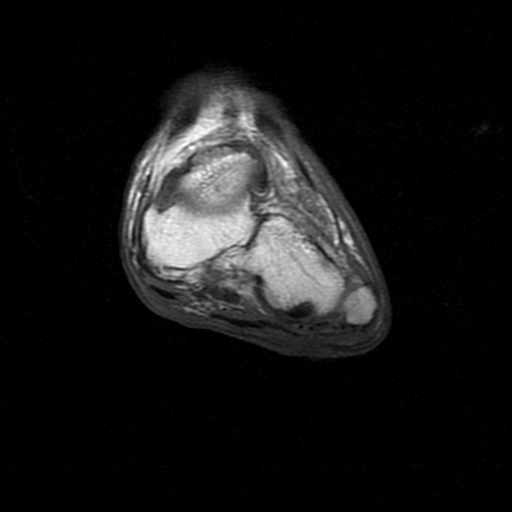
[im 33/33]
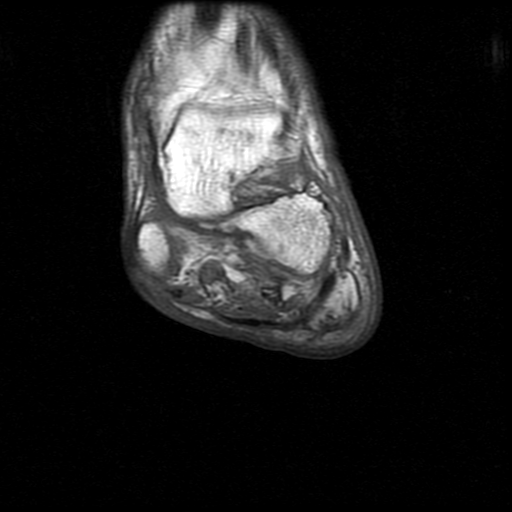

[Series 3: T2 · coronal · 4.0mm · 0.29mm/px · 3 of 33 slices shown]
[im 4/33]
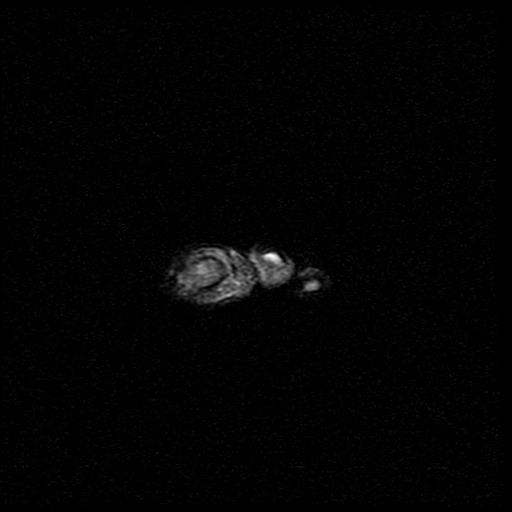
[im 17/33]
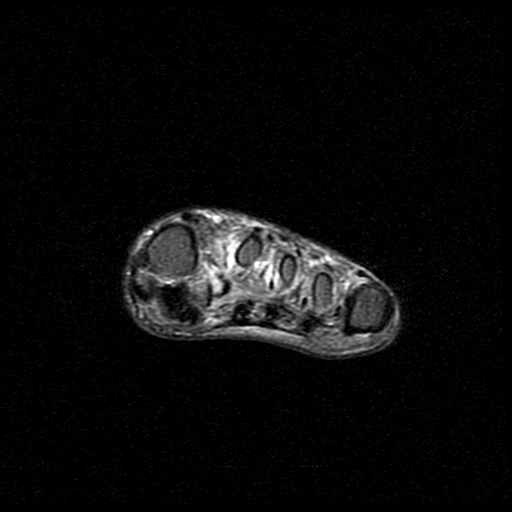
[im 29/33]
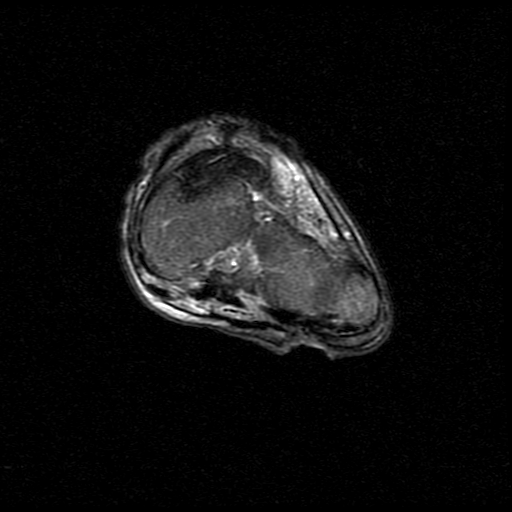

[Series 4: T1 · axial · 4.0mm · 0.31mm/px · z∈[-65,+7]mm · 4 of 16 slices shown (2 of 2)]
[im 1/16]
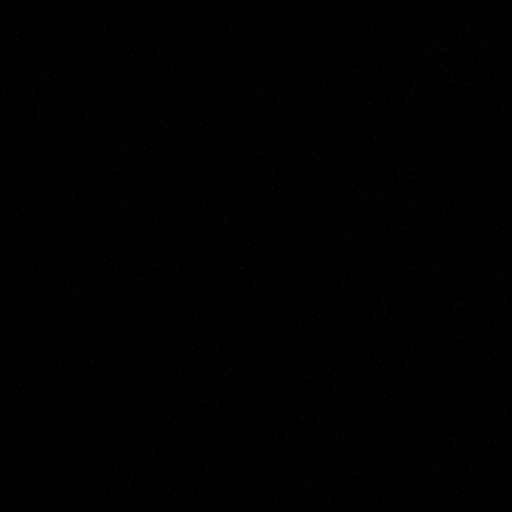
[im 4/16]
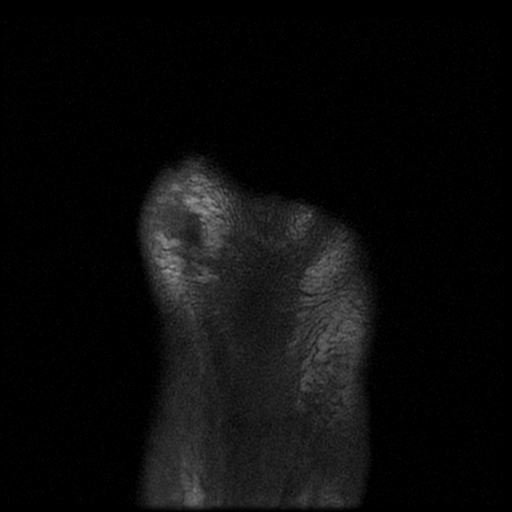
[im 8/16]
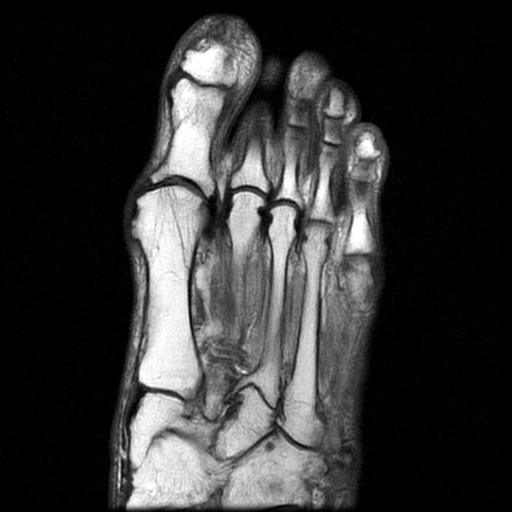
[im 16/16]
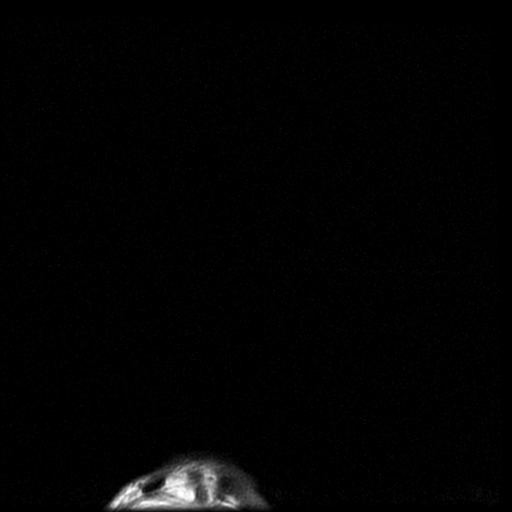

[Series 5: STIR · axial · 4.0mm · 0.31mm/px · z∈[-65,+7]mm · 3 of 16 slices shown]
[im 1/16]
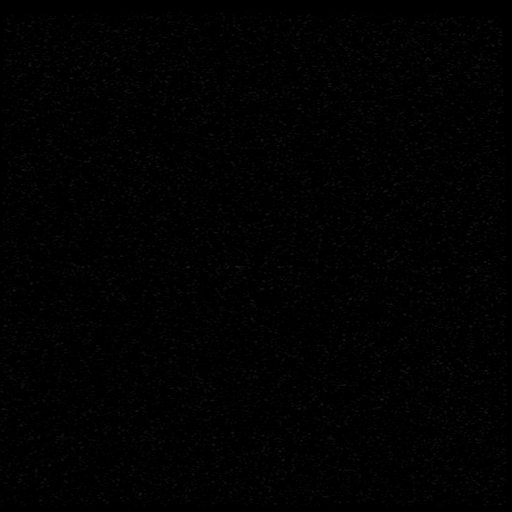
[im 8/16]
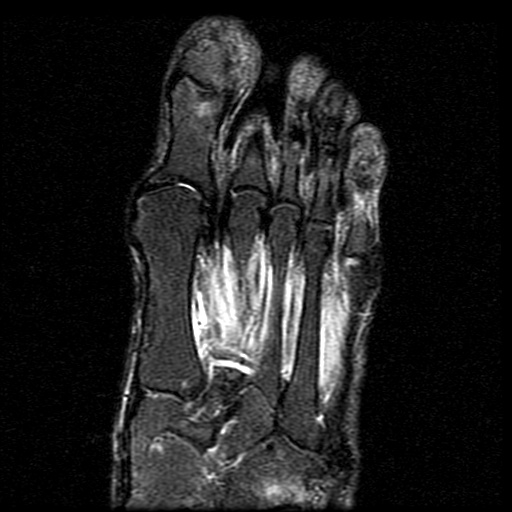
[im 16/16]
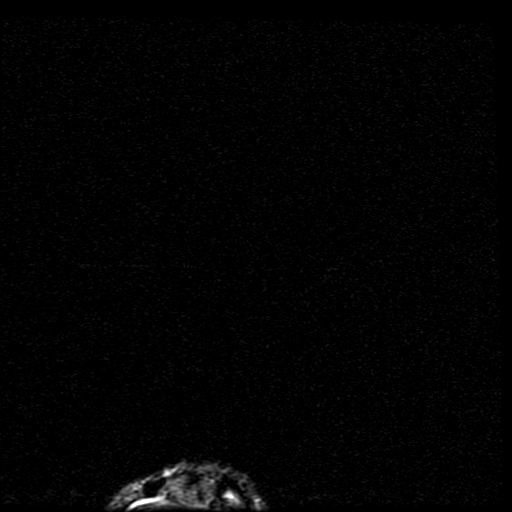

[19 of 40 positions shown; findings below may reference images not displayed]

FINDINGS: There is an open wound on the plantar aspect of the foot measuring a
maximum of 12 mm. This is located near the base of the fourth
metatarsal. I do not see a discrete soft tissue abscess. There is
mild cellulitis and moderate myositis but no discrete drainable
fluid collection. I do not see any definite findings for septic
arthritis or osteomyelitis.

Small focal area of marrow edema in the proximal cuboid is likely a
stress reaction. I do not think this represents osteomyelitis and I
do not see a definite stress fracture.
IMPRESSION: Open wound on the plantar aspect of the foot with focal cellulitis
and moderate myositis but no discrete drainable soft tissue abscess.

No MR findings to suggest septic arthritis or osteomyelitis.

## 2016-01-30 NOTE — Telephone Encounter (Signed)
Attempted to contact pharmacy to advise them to no longer fill the Humalog, as [name] asked to deny. I will try again later. Discontinued medication on patient med list. Only Novolog on there for now.

## 2016-02-09 ENCOUNTER — Ambulatory Visit: Payer: Medicare Other | Admitting: Endocrinology

## 2016-02-09 ENCOUNTER — Telehealth: Payer: Self-pay | Admitting: Endocrinology

## 2016-02-09 NOTE — Telephone Encounter (Signed)
Pt came by for her appointment but was late, she rescheduled for 02/22/16.  She stated that she has not gotten any of the Calcitrol refilled which she was told she needed to take every week M-W-F.  She was very upset that was not done along with her foot brace not being done either. Please advise and call patient back.

## 2016-02-10 NOTE — Telephone Encounter (Signed)
That should be done either by nephrologist or PCP, not my prescription

## 2016-02-10 NOTE — Telephone Encounter (Signed)
Per note from 10/24 note has been placed stating foot brace cannot be completed until the pt is seen.  She was seen for an appt on 12/07/15 and has been scheduled to 11/22.  Is calcitrol ok to refill?

## 2016-02-10 NOTE — Telephone Encounter (Signed)
LM with Dr. Ronnie Derby recommendations asked for call back if needed

## 2016-02-22 ENCOUNTER — Ambulatory Visit (INDEPENDENT_AMBULATORY_CARE_PROVIDER_SITE_OTHER): Payer: Medicare Other | Admitting: Endocrinology

## 2016-02-22 ENCOUNTER — Encounter: Payer: Self-pay | Admitting: Endocrinology

## 2016-02-22 VITALS — BP 102/70 | HR 85 | Ht 67.0 in | Wt 136.8 lb

## 2016-02-22 DIAGNOSIS — E1121 Type 2 diabetes mellitus with diabetic nephropathy: Secondary | ICD-10-CM

## 2016-02-22 DIAGNOSIS — IMO0002 Reserved for concepts with insufficient information to code with codable children: Secondary | ICD-10-CM

## 2016-02-22 DIAGNOSIS — E1049 Type 1 diabetes mellitus with other diabetic neurological complication: Secondary | ICD-10-CM | POA: Diagnosis not present

## 2016-02-22 DIAGNOSIS — E7800 Pure hypercholesterolemia, unspecified: Secondary | ICD-10-CM

## 2016-02-22 DIAGNOSIS — E1065 Type 1 diabetes mellitus with hyperglycemia: Secondary | ICD-10-CM | POA: Diagnosis not present

## 2016-02-22 LAB — POCT GLYCOSYLATED HEMOGLOBIN (HGB A1C): Hemoglobin A1C: 12

## 2016-02-22 NOTE — Progress Notes (Signed)
Patient ID: [name], female   DOB: [birth year], 45 y.o.   MRN: [id number]   Reason for Appointment: follow-up of diabetes  History of Present Illness   Diagnosis: Type 1 DIABETES MELITUS  Previous history: She has had long-standing poorly controlled diabetes and typically has poor compliance with self care measures despite periodic diabetes education and periodic followup  INSULIN PUMP regimen:   BASAL = 0.25  Carb coverage 1: 15 g and correction 1: 60 with target 120, active insulin 4 hours         Recent history:   Previous A1c was better than usual at 8.4%, now 12% She did not follow-up for her one-month visit as scheduled    Current blood sugar patterns, difficulties with management and problems identified:  She apparently got a new pump about a week or 2 after her last visit and did not know how to program it and has pertinent laboratory basal rate of 0.2 only instead of a basal rate range from 0.4-0.6  Her A1c is higher than usual  She is having blood sugars mostly over 400 throughout the day  Only occasionally has a little better reading in the early morning hours or before noon  Blood sugars on usually not improving with boluses for meals and correction; on an average is getting 3 boluses a day ranging from 1-5  She is mostly eating 1 meal a day either in the early afternoon or late evening  She does tend that she is cutting back on drinking juices  She has changed her infusion set only once in the last 2 weeks according to her record  Mean values apply above for all meters except median for One Touch  PRE-MEAL Fasting Lunch Dinner Bedtime Overall  Glucose range: 212-400+   359-400+  368-100+    Mean/median:     452    Meals: eating at variable times and also having snacks periodically.  Her daily carbohydrate intake is quite variable Physical activity: exercise: none         Dietician visit: Most recent:?          Complications: are:  Neuropathy, nephropathy, diabetic foot ulcer      Wt Readings from Last 3 Encounters:  02/24/16 140 lb (63.5 kg)  02/22/16 136 lb 12.8 oz (62.1 kg)  12/07/15 137 lb 9.6 oz (62.4 kg)   Lab Results  Component Value Date   HGBA1C 12.0 02/22/2016   HGBA1C 8.4 (H) 11/04/2015   HGBA1C 9.1 (H) 04/15/2015   Lab Results  Component Value Date   MICROALBUR 89.4 (H) 12/02/2015   LDLCALC 117 (H) 12/02/2015   CREATININE 1.96 (H) 12/02/2015    Other problems addressed:: See review of systems    Medication List       Accurate as of 02/22/16 11:59 PM. Always use your most recent med list.          albuterol 108 (90 Base) MCG/ACT inhaler Commonly known as:  PROVENTIL HFA;VENTOLIN HFA Inhale 2 puffs into the lungs every 6 (six) hours as needed. Reported on 04/15/2015   amLODipine 10 MG tablet Commonly known as:  NORVASC Take 10 mg by mouth daily.   calcitRIOL 0.25 MCG capsule Commonly known as:  ROCALTROL Take 0.25 mcg by mouth daily. Reported on 04/15/2015   ferrous sulfate 325 (65 FE) MG tablet Take 1 tablet (325 mg total) by mouth 3 (three) times daily with meals. Reported on 8/88/9169   folic acid 1 MG tablet  Commonly known as:  FOLVITE Take 1 mg by mouth daily. Reported on 04/15/2015   furosemide 40 MG tablet Commonly known as:  LASIX Take 40 mg by mouth daily as needed.   gabapentin 300 MG capsule Commonly known as:  NEURONTIN Take 300 mg by mouth every 12 (twelve) hours as needed.   gabapentin 100 MG capsule Commonly known as:  NEURONTIN Take 1 capsule (100 mg total) by mouth 3 (three) times daily.   glucose blood test strip Commonly known as:  BAYER CONTOUR NEXT TEST Use as instructed to check blood sugar 4 times per day dx code E10.65   HYDROcodone-acetaminophen 10-325 MG tablet Commonly known as:  NORCO Take 1 tablet by mouth every 6 (six) hours as needed.   mupirocin ointment 2 % Commonly known as:  BACTROBAN Place 1 application into the nose 2 (two) times  daily.   norethindrone 5 MG tablet Commonly known as:  AYGESTIN Take 1 tablet by mouth daily.   NOVOLOG 100 UNIT/ML injection Generic drug:  insulin aspart USE MAX 65 UNITS PER DAY WITH PUMP   pravastatin 40 MG tablet Commonly known as:  PRAVACHOL Take 1 tablet (40 mg total) by mouth daily.   pyridoxine 100 MG tablet Commonly known as:  B-6 Take 100 mg by mouth daily. Reported on 04/15/2015   vitamin B-12 100 MCG tablet Commonly known as:  CYANOCOBALAMIN Take 200 mcg by mouth daily. Reported on 04/15/2015   Vitamin D (Ergocalciferol) 50000 units Caps capsule Commonly known as:  DRISDOL Take 1 capsule (50,000 Units total) by mouth every 7 (seven) days.       Allergies:  Allergies  Allergen Reactions  . Lisinopril Other (See Comments)    Elevated potassium per pt report  . Ciprofloxacin Itching  . Cleocin [Clindamycin Hcl]     Diarrhea     Past Medical History:  Diagnosis Date  . Anemia   . C. difficile diarrhea 09/26/2014  . Cataracts, both eyes   . Cellulitis of right foot 06/02/2014  . CKD (chronic kidney disease) stage 3, GFR 30-59 ml/min   . Diabetic ulcer of right foot ([place]) 06/02/2014  . DVT (deep venous thrombosis) ([place]) 10/2014  . H/O seasonal allergies   . Hypercholesteremia   . Hypertension   . Left foot infection   . Neuropathy in diabetes ([place])   . Staphylococcus aureus bacteremia 10/2014  . Type 1 diabetes ([place])    onset age 2    Past Surgical History:  Procedure Laterality Date  . CESAREAN SECTION    . EYE SURGERY    . FOOT AMPUTATION THROUGH METATARSAL Left   . hemrrhoidectomy    . I&D EXTREMITY Right 06/10/2014   Procedure: IRRIGATION AND DEBRIDEMENT Right Foot;  Surgeon: Newt Minion, MD;  Location: [facility];  Service: Orthopedics;  Laterality: Right;  . PERIPHERALLY INSERTED CENTRAL CATHETER INSERTION    . SKIN SPLIT GRAFT Right 06/15/2014   Procedure: SPLIT THICKNESS SKIN GRAFT RIGHT FOOT;  Surgeon: Newt Minion, MD;  Location: [facility];   Service: Orthopedics;  Laterality: Right;  . TRANSMETATARSAL AMPUTATION Right     Family History  Problem Relation Age of Onset  . Hyperlipidemia Mother   . Dementia Mother     Social History:  reports that she quit smoking about 7 years ago. She has never used smokeless tobacco. She reports that she drinks about 0.5 oz of alcohol per week . She reports that she does not use drugs.  Review of Systems:  Hypertension/CKD:    Followed by nephrologist, seen about a week ago Blood pressure is normal without any medications  Potassium Has been previously high, she has recently seen nephrologist but labs not available   Lab Results  Component Value Date   CREATININE 1.96 (H) 12/02/2015   BUN 57 (H) 12/02/2015   NA 135 12/02/2015   K 5.2 (H) 12/02/2015   CL 109 12/02/2015   CO2 19 12/02/2015     Lipids: Has been  prescribed pravastatin, She thinks she is taking this regularly More recently but previously was not and last LDL was over 100   Lab Results  Component Value Date   CHOL 182 12/02/2015   HDL 27.80 (L) 12/02/2015   LDLCALC 117 (H) 12/02/2015   LDLDIRECT 112.0 07/05/2014   TRIG 186.0 (H) 12/02/2015   CHOLHDL 7 12/02/2015     She has been treated by nephrologist for anemia and has required iron infusion    Lab Results  Component Value Date   WBC 4.9 11/04/2015   HGB 10.2 (L) 11/04/2015   HCT 30.7 (L) 11/04/2015   MCV 82.3 11/04/2015   PLT 434 (H) 11/04/2015   She has a Charcot foot  Also has had amputation of Left fifth toe which was infected    Examination:   BP 102/70   Pulse 85   Ht 5\' 7"  (1.702 m)   Wt 136 lb 12.8 oz (62.1 kg) Comment: Has "boot" on left foot, weighs 5 lbs.  SpO2 98%   BMI 21.43 kg/m   Body mass index is 21.43 kg/m.   Diabetic Foot Exam - Simple   Simple Foot Form Diabetic Foot exam was performed with the following findings:  Yes 02/22/2016  3:39 PM  Visual Inspection See comments:  Yes Sensation Testing See comments:   Yes Pulse Check See comments:  Yes Comments No ulcerations or skin breakdown Right transmetatarsal amputation present Left fifth toes amputated Left foot has flattening of the arch and prominence of the proximal bones on the plantar surface Monofilament sensation absent in the feet bilaterally Posterior tibialis pulses not felt because of edema Left dorsalis pedis pulses normal    2+ lower leg edema present  ASSESSMENT/ PLAN:   Diabetes type 1 with poor control and multiple complications    Her diabetes is persistently poorly controlled mostly from noncompliance with all measures of self-care especially recently A1c is now 12  See history of present illness for details of her blood sugar patterns, current management and problems identified She is being treated here with the comprehensive plan of care for her diabetes  Her blood sugars are recently poorly controlled because of her setting a basal rate of 0.25 only and did not ask for help with programming her pump which was replaced Not motivated to get her blood sugars down and appears to be not at all concerned about her persistent blood sugars near 400 This is despite having multiple diabetic complications  She is again not motivated and totally going off her diet recently Compliance with glucose monitoring, boluses and types of meals is poor.  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  The patient would benefit from diabetic foot wear in order to Protect her feet  EDEMA: This is related to nephrotic syndrome, encouraged her to start diuretics as prescribed by nephrologist which she has not so far  Lipids are not is well-controlled as of last visit but  she thinks she was previously noncompliant with pravastatin and will need to make sure lipids are rechecked   PLAN:    Basal rate 0.4 at midnight, 0.5 at 12  noon and 0.6 at 5 PM, the basal rates were programmed for the patient today and shown to her  Also she needs to be consistent with eating balanced meals and snacks and bolusing for all carbohydrate intake.  More consistent follow-up in the office  She needs to check her sugar at the time of her meal and somewhat of low out later to help with assessing the bolus settings  Needs to bolus for all blood sugars over 200 at least  If the blood sugar is high she needs to do a follow-up reading after doing a correction  Call if starting to have any low blood sugars  She also needs to change her infusion sets consistently twice a week instead of once every week or longer  Review level of control in 6 weeks but also needs to see the nurse educator in 3 weeks for day-to-day management and also learning how to program her pump  Do not think she is a candidate for the 670 pump for a while because blood sugars are hardly ever below 300, however may be a candidate for a continuous glucose monitor   Counseling time on subjects discussed above is over 50% of today's 25 minute visit  There are no Patient Instructions on file for this visit.   Candita Borenstein 02/24/2016, 3:33 PM    addendum: Vitamin D level is 10.4, start 50,000 units weekly

## 2016-02-24 ENCOUNTER — Emergency Department (HOSPITAL_BASED_OUTPATIENT_CLINIC_OR_DEPARTMENT_OTHER)
Admission: EM | Admit: 2016-02-24 | Discharge: 2016-02-24 | Disposition: A | Discharge status: Home / Self Care | Payer: Medicare Other | Attending: Emergency Medicine | Admitting: Emergency Medicine

## 2016-02-24 ENCOUNTER — Emergency Department (HOSPITAL_BASED_OUTPATIENT_CLINIC_OR_DEPARTMENT_OTHER): Payer: Medicare Other

## 2016-02-24 ENCOUNTER — Encounter (HOSPITAL_BASED_OUTPATIENT_CLINIC_OR_DEPARTMENT_OTHER): Payer: Self-pay

## 2016-02-24 DIAGNOSIS — Z87891 Personal history of nicotine dependence: Secondary | ICD-10-CM | POA: Insufficient documentation

## 2016-02-24 DIAGNOSIS — E1022 Type 1 diabetes mellitus with diabetic chronic kidney disease: Secondary | ICD-10-CM | POA: Insufficient documentation

## 2016-02-24 DIAGNOSIS — R0602 Shortness of breath: Secondary | ICD-10-CM | POA: Diagnosis not present

## 2016-02-24 DIAGNOSIS — L03211 Cellulitis of face: Secondary | ICD-10-CM | POA: Diagnosis not present

## 2016-02-24 DIAGNOSIS — N183 Chronic kidney disease, stage 3 (moderate): Secondary | ICD-10-CM | POA: Diagnosis not present

## 2016-02-24 DIAGNOSIS — M62830 Muscle spasm of back: Secondary | ICD-10-CM | POA: Diagnosis not present

## 2016-02-24 DIAGNOSIS — Z79899 Other long term (current) drug therapy: Secondary | ICD-10-CM | POA: Diagnosis not present

## 2016-02-24 DIAGNOSIS — I129 Hypertensive chronic kidney disease with stage 1 through stage 4 chronic kidney disease, or unspecified chronic kidney disease: Secondary | ICD-10-CM | POA: Insufficient documentation

## 2016-02-24 DIAGNOSIS — R21 Rash and other nonspecific skin eruption: Secondary | ICD-10-CM | POA: Diagnosis present

## 2016-02-24 MED ORDER — DOXYCYCLINE HYCLATE 100 MG PO CAPS: 100.0000 mg | ORAL_CAPSULE | Freq: Two times a day (BID) | ORAL | 0 refills | Status: DC

## 2016-02-24 MED ORDER — DIAZEPAM 5 MG PO TABS
5.0000 mg | ORAL_TABLET | Freq: Once | ORAL | Status: AC
  Administered 2016-02-24: 5 mg via ORAL
  Filled 2016-02-24: qty 1

## 2016-02-24 MED ORDER — DOXYCYCLINE HYCLATE 100 MG PO TABS
100.0000 mg | ORAL_TABLET | Freq: Once | ORAL | Status: AC
  Administered 2016-02-24: 100 mg via ORAL
  Filled 2016-02-24: qty 1

## 2016-02-24 MED ORDER — CYCLOBENZAPRINE HCL 10 MG PO TABS: 10.0000 mg | ORAL_TABLET | Freq: Three times a day (TID) | ORAL | 0 refills | Status: DC | PRN

## 2016-02-24 MED FILL — CYCLOBENZAPRINE 10 MG TAB: 10 | 3 days supply | Qty: 10 | Fill #0

## 2016-02-24 MED FILL — DOXYCYCLINE HYCLATE 100 MG: 100 | 7 days supply | Qty: 14 | Fill #0

## 2016-02-24 NOTE — ED Notes (Signed)
Patient returned from xray with tech via stretcher at this time.

## 2016-02-24 NOTE — ED Notes (Signed)
Patient reports feeling drowsy from the medication and nurse has instructed the patient to stay to sleep for safe driving. Patient agrees and will rest until able to drive home. Patient reports no pain when lying still. Patient denies any other needs at this time.

## 2016-02-24 NOTE — ED Triage Notes (Signed)
Patient woke up from sleep with left sided thoracic back pain. Patient drove herself and took an oxycode, antibiotic and muscle relaxer at 2300.

## 2016-02-24 NOTE — ED Provider Notes (Signed)
[facility] DEPT [facility] Provider Note   CSN: [id number] Arrival date & time: 02/24/16  0400     History   Chief Complaint Chief Complaint  Patient presents with  . Back Pain    HPI [name] is a 45 y.o. female.  The history is provided by the patient.  Back Pain   This is a new problem. The current episode started more than 2 days ago. The problem occurs daily. The problem has been rapidly worsening. The pain is associated with no known injury. The quality of the pain is described as stabbing. The pain is severe. The symptoms are aggravated by bending, twisting and certain positions. Pertinent negatives include no chest pain, no fever, no bowel incontinence, no bladder incontinence and no weakness. Treatments tried: oxycodone, "antibiotic" "muscle relaxant" The treatment provided no relief.  Patient with h/o poorly controlled diabetes presents for 2 complaints:  1. She report rash to chin for past several days, she thinks "it's MRSA" She has had this previously.  2 she reports onset of upper back pain about 3 days ago.  It was gradual in onset, then became abruptly worse tonight. She took multiple medications at home for this pain including pain medication, muscle relaxant and an unknown antibiotic.   No trauma She reports pain is worse with movement/palpation and breathing No anterior chest pain  no hemoptysis   Past Medical History:  Diagnosis Date  . Anemia   . C. difficile diarrhea 09/26/2014  . Cataracts, both eyes   . Cellulitis of right foot 06/02/2014  . CKD (chronic kidney disease) stage 3, GFR 30-59 ml/min   . Diabetic ulcer of right foot ([place]) 06/02/2014  . DVT (deep venous thrombosis) ([place]) 10/2014  . H/O seasonal allergies   . Hypercholesteremia   . Hypertension   . Left foot infection   . Neuropathy in diabetes ([place])   . Staphylococcus aureus bacteremia 10/2014  . Type 1 diabetes [facility])    onset age 73    Patient Active Problem List   Diagnosis  Date Noted  . Boil of scalp 11/04/2015  . Boil of left cheek 11/04/2015  . Boil of lower abdomen 11/04/2015  . MRSA cellulitis 11/04/2015  . Hyponatremia 11/03/2015  . Bacterial folliculitis 53/97/6734  . Heart murmur 11/03/2015  . Protein-calorie malnutrition, moderate ([place]) 10/18/2014  . Left leg DVT ([place]) 10/18/2014  . DVT of upper extremity (deep vein thrombosis) ([place])   . Staphylococcus aureus bacteremia   . Cellulitis of left foot 10/13/2014  . Type 1 diabetes mellitus with hyperglycemia ([place]) 10/13/2014  . Chronic anemia 10/13/2014  . Hyperkalemia 10/06/2014  . Bilateral lower extremity edema 10/04/2014  . 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 Diabetic ulcer of left foot associated with type 1 diabetes mellitus ([place]) 09/26/2014  . Acute kidney injury superimposed on chronic kidney disease ([place]) 06/13/2014  . DM type 1 causing renal disease ([place]) 06/13/2013  . Osteomyelitis ([place]) 06/12/2013  . CKD (chronic kidney disease), stage III 06/12/2013  . UTI (urinary tract infection) 03/25/2013  . CAP (community acquired pneumonia) 03/25/2013  . Acute blood loss anemia 03/25/2013  . Acidosis 03/25/2013  . Leukocytosis, unspecified 03/25/2013  . Iron deficiency anemia 05/09/2011  . Charcot foot due to diabetes mellitus ([place]) 05/09/2011  . Foot ulcer due to secondary DM ([place]) 05/09/2011  . Type 1 diabetes mellitus with neurological manifestations, uncontrolled ([place])   . Hypertension   . Hypercholesteremia     Past Surgical  History:  Procedure Laterality Date  . CESAREAN SECTION    . EYE SURGERY    . FOOT AMPUTATION THROUGH METATARSAL Left   . hemrrhoidectomy    . I&D EXTREMITY Right 06/10/2014   Procedure: IRRIGATION AND DEBRIDEMENT Right Foot;  Surgeon: Newt Minion, MD;  Location: [facility];  Service: Orthopedics;  Laterality: Right;  . PERIPHERALLY INSERTED CENTRAL CATHETER INSERTION    . SKIN SPLIT GRAFT Right 06/15/2014   Procedure: SPLIT  THICKNESS SKIN GRAFT RIGHT FOOT;  Surgeon: Newt Minion, MD;  Location: [facility];  Service: Orthopedics;  Laterality: Right;  . TRANSMETATARSAL AMPUTATION Right     OB History    No data available       Home Medications    Prior to Admission medications   Medication Sig Start Date End Date Taking? Authorizing Provider  albuterol (PROVENTIL HFA;VENTOLIN HFA) 108 (90 BASE) MCG/ACT inhaler Inhale 2 puffs into the lungs every 6 (six) hours as needed. Reported on 04/15/2015    Historical Provider, MD  amLODipine (NORVASC) 10 MG tablet Take 10 mg by mouth daily.    Historical Provider, MD  calcitRIOL (ROCALTROL) 0.25 MCG capsule Take 0.25 mcg by mouth daily. Reported on 04/15/2015    Historical Provider, MD  ferrous sulfate 325 (65 FE) MG tablet Take 1 tablet (325 mg total) by mouth 3 (three) times daily with meals. Reported on 04/15/2015 04/18/15   Elayne Snare, MD  folic acid (FOLVITE) 1 MG tablet Take 1 mg by mouth daily. Reported on 04/15/2015    Historical Provider, MD  furosemide (LASIX) 40 MG tablet Take 40 mg by mouth daily as needed.    Historical Provider, MD  gabapentin (NEURONTIN) 100 MG capsule Take 1 capsule (100 mg total) by mouth 3 (three) times daily. Patient not taking: Reported on 02/22/2016 04/18/15   Elayne Snare, MD  gabapentin (NEURONTIN) 300 MG capsule Take 300 mg by mouth every 12 (twelve) hours as needed.    Historical Provider, MD  glucose blood (BAYER CONTOUR NEXT TEST) test strip Use as instructed to check blood sugar 4 times per day dx code E10.65 Patient not taking: Reported on 02/22/2016 12/07/15   Elayne Snare, MD  HYDROcodone-acetaminophen (NORCO) 10-325 MG tablet Take 1 tablet by mouth every 6 (six) hours as needed.    Historical Provider, MD  mupirocin ointment (BACTROBAN) 2 % Place 1 application into the nose 2 (two) times daily.    Historical Provider, MD  norethindrone (AYGESTIN) 5 MG tablet Take 1 tablet by mouth daily. 10/17/15   Historical Provider, MD  NOVOLOG 100  UNIT/ML injection USE MAX 65 UNITS PER DAY WITH PUMP 02/16/15   Elayne Snare, MD  pravastatin (PRAVACHOL) 40 MG tablet Take 1 tablet (40 mg total) by mouth daily. 01/09/16   Elayne Snare, MD  pyridoxine (B-6) 100 MG tablet Take 100 mg by mouth daily. Reported on 04/15/2015    Historical Provider, MD  vitamin B-12 (CYANOCOBALAMIN) 100 MCG tablet Take 200 mcg by mouth daily. Reported on 04/15/2015    Historical Provider, MD  Vitamin D, Ergocalciferol, (DRISDOL) 50000 units CAPS capsule Take 1 capsule (50,000 Units total) by mouth every 7 (seven) days. 07/18/15   Elayne Snare, MD    Family History Family History  Problem Relation Age of Onset  . Hyperlipidemia Mother   . Dementia Mother     Social History Social History  Substance Use Topics  . Smoking status: Former Smoker    Quit date: 05/16/2008  . Smokeless tobacco:  Never Used  . Alcohol use 0.5 oz/week    1 Standard drinks or equivalent per week     Comment: Occasional     Allergies   Lisinopril; Ciprofloxacin; and Cleocin [clindamycin hcl]   Review of Systems Review of Systems  Constitutional: Negative for fever.  Respiratory: Positive for shortness of breath.        Denies hemoptysis   Cardiovascular: Negative for chest pain.  Gastrointestinal: Negative for bowel incontinence.  Genitourinary: Negative for bladder incontinence.  Musculoskeletal: Positive for back pain.  Neurological: Negative for weakness.  All other systems reviewed and are negative.    Physical Exam Updated Vital Signs BP 149/91 (BP Location: Left Arm)   Pulse 92   Temp 98.5 F (36.9 C)   Resp 17   Ht 5\' 7"  (1.702 m)   Wt 63.5 kg   SpO2 100%   BMI 21.93 kg/m   Physical Exam CONSTITUTIONAL: appears chronically ill, anxious HEAD: Normocephalic/atraumatic EYES: EOMI ENMT: Mucous membranes moist, area of cellulitis to chin, no fluctuance noted  NECK: supple no meningeal signs SPINE/BACK:entire spine nontender, exquisite tenderness to left  para-thoracic region.  No rash, no erythema noted CV: S1/S2 noted, no murmurs/rubs/gallops noted LUNGS: Lungs are clear to auscultation bilaterally, no apparent distress ABDOMEN: soft, nontender GU:no cva tenderness NEURO: Pt is awake/alert/appropriate, moves all extremitiesx4. EXTREMITIES: pulses normal/equal, full ROM, left LE in walking boot SKIN: warm, color normal PSYCH: anxious  ED Treatments / Results  Labs (all labs ordered are listed, but only abnormal results are displayed) Labs Reviewed - No data to display  EKG  EKG Interpretation None       Radiology Dg Chest 2 View  Result Date: 02/24/2016 CLINICAL DATA:  Left posterior chest pain for 2 days. Ex-smoker. Possible injury during sex. EXAM: CHEST  2 VIEW COMPARISON:  09/01/2014 FINDINGS: The heart size and mediastinal contours are within normal limits. Both lungs are clear. The visualized skeletal structures are unremarkable. IMPRESSION: No active cardiopulmonary disease. Electronically Signed   By: Lucienne Capers M.D.   On: 02/24/2016 04:47    Procedures Procedures (including critical care time)  Medications Ordered in ED Medications  doxycycline (VIBRA-TABS) tablet 100 mg (not administered)  diazepam (VALIUM) tablet 5 mg (5 mg Oral Given 02/24/16 0506)     Initial Impression / Assessment and Plan / ED Course  I have reviewed the triage vital signs and the nursing notes.  Pertinent labs & imaging results that were available during my care of the patient were reviewed by me and considered in my medical decision making (see chart for details).  Clinical Course    4:28 AM Pt here for rash to chin (likely cellulitis) as well as a-traumatic back pain It is very reproducible/positional.  She took meds at home that did not relieve symptoms then drove to the ED. She may be having spasms, valium ordered Will also order CXR Pt reports she has been wearing boot to left foot "because my doctor won't clear me" but  denies any new issue with this at this time 5:23 AM CXR negative Pt feels some improvement Her pain is clearly reproducible on exam, and worse with certain positions She reports now that she was doing heavy lifting/moving furniture during the day.  She already had some pain prior to this, but this may have exacerbated her pain.  She seems to have episodes of spasms during our conversation I doubt PE (no hypoxia, no hemoptysis,pain is reproducible and positional) No signs of any  spinal involvement in this pain No signs of acute cardiopulmonary emergency Will treat for cellulitis of chin as well  6:36 AM Pt resting comfortably. She appears more comfortable but still has some pain with movement It has been >2hrs since meds given She appears appropriate for discharge Advised to f/u with PCP next week if no improvement We discussed strict return precautions  Final Clinical Impressions(s) / ED Diagnoses   Final diagnoses:  Cellulitis of chin  Muscle spasm of back    New Prescriptions New Prescriptions   CYCLOBENZAPRINE (FLEXERIL) 10 MG TABLET    Take 1 tablet (10 mg total) by mouth 3 (three) times daily as needed for muscle spasms.   DOXYCYCLINE (VIBRAMYCIN) 100 MG CAPSULE    Take 1 capsule (100 mg total) by mouth 2 (two) times daily. One po bid x 7 days     Ripley Fraise, MD 02/24/16 [phone]

## 2016-03-07 ENCOUNTER — Encounter: Payer: Medicare Other | Attending: Endocrinology | Admitting: Nutrition

## 2016-03-07 DIAGNOSIS — Z713 Dietary counseling and surveillance: Secondary | ICD-10-CM | POA: Insufficient documentation

## 2016-03-07 DIAGNOSIS — E1065 Type 1 diabetes mellitus with hyperglycemia: Secondary | ICD-10-CM

## 2016-03-07 NOTE — Progress Notes (Signed)
Patient reports that she has not been testing or taking insulin before meals.  Since seeing dr. Dwyane Dee, she has done "much better", only forgetting her bolus 1 time yesterday.   She did not bring her meter with her, but says the last 2 FBSs, have been 132, and 109.   We reviewed the need to take insulin, even if not testing, and she says that she knows, this, but just "forgets, or gets busy, and does not remember to do it.  Suggested she not eat anything, unless bolusing first.  She agreed to try this.   Also discussed the importance of testing blood sugars-- to correct the high readings.  She agreed to test more frequently--suggested ac and HS

## 2016-03-08 IMAGING — CR DG FOOT COMPLETE 3+V*L*
3 series · 3 of 3 positions shown · non-contrast
Comparison: MRI dated 09/04/2014

CLINICAL DATA: 43-year-old female with left foot ulcer and the
fourth and fifth metatarsal base

EXAM:
LEFT FOOT - COMPLETE 3+ VIEW

[t foot ap left]
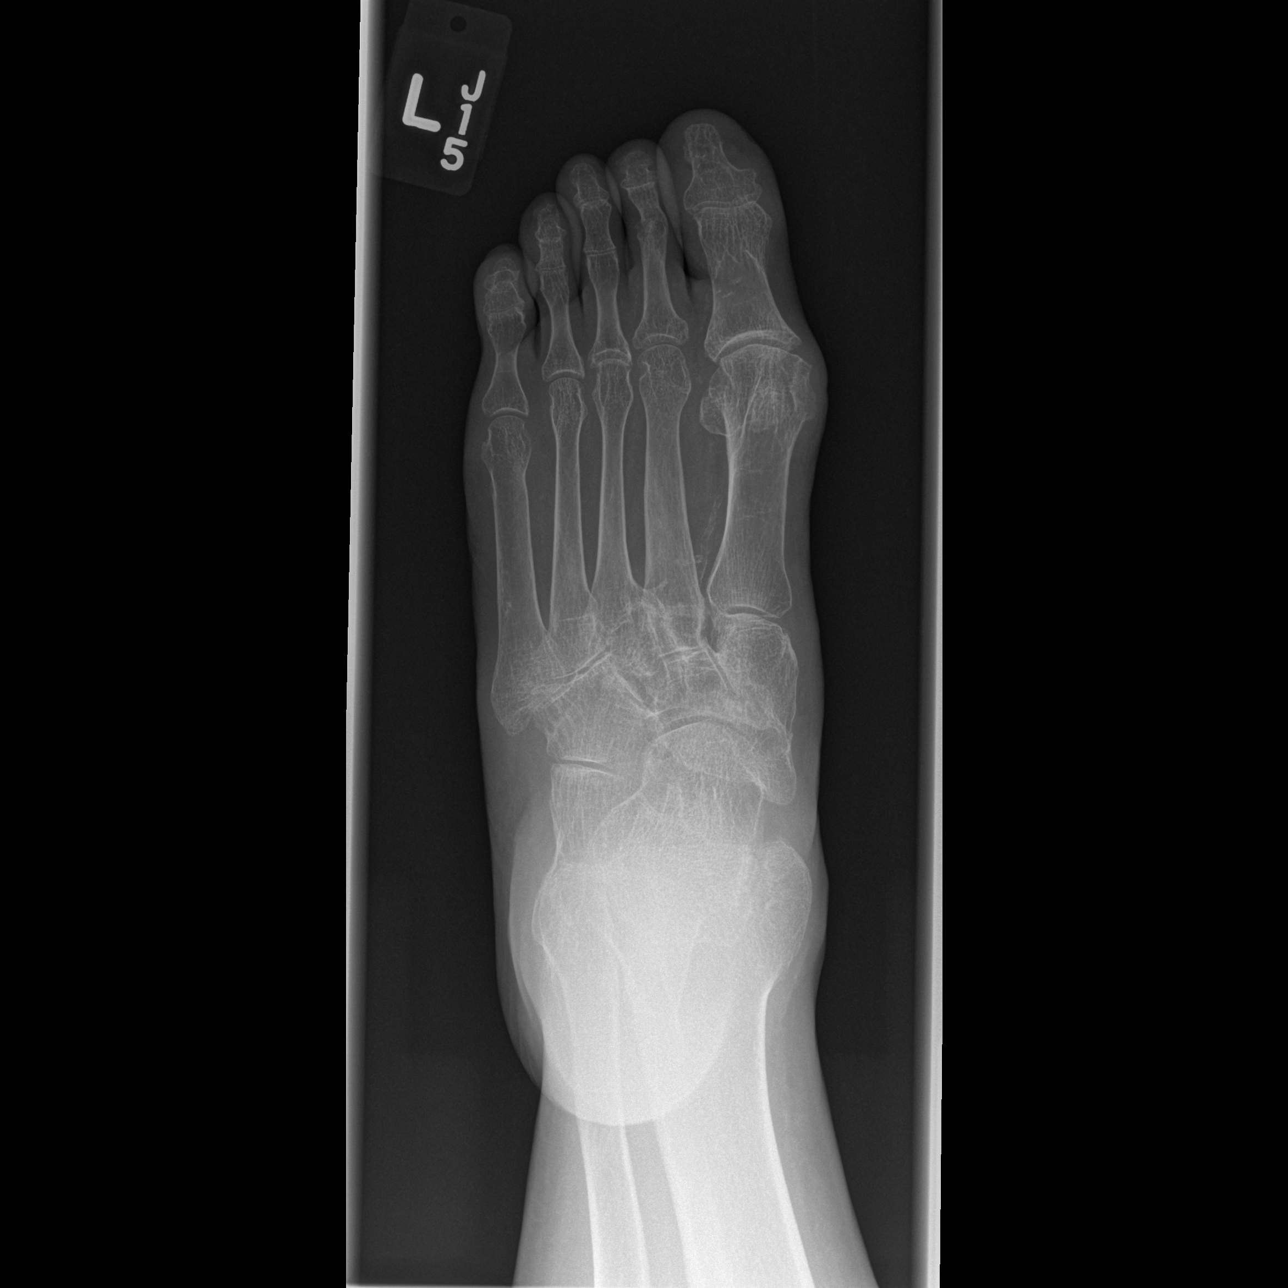

[t foot oblique left]
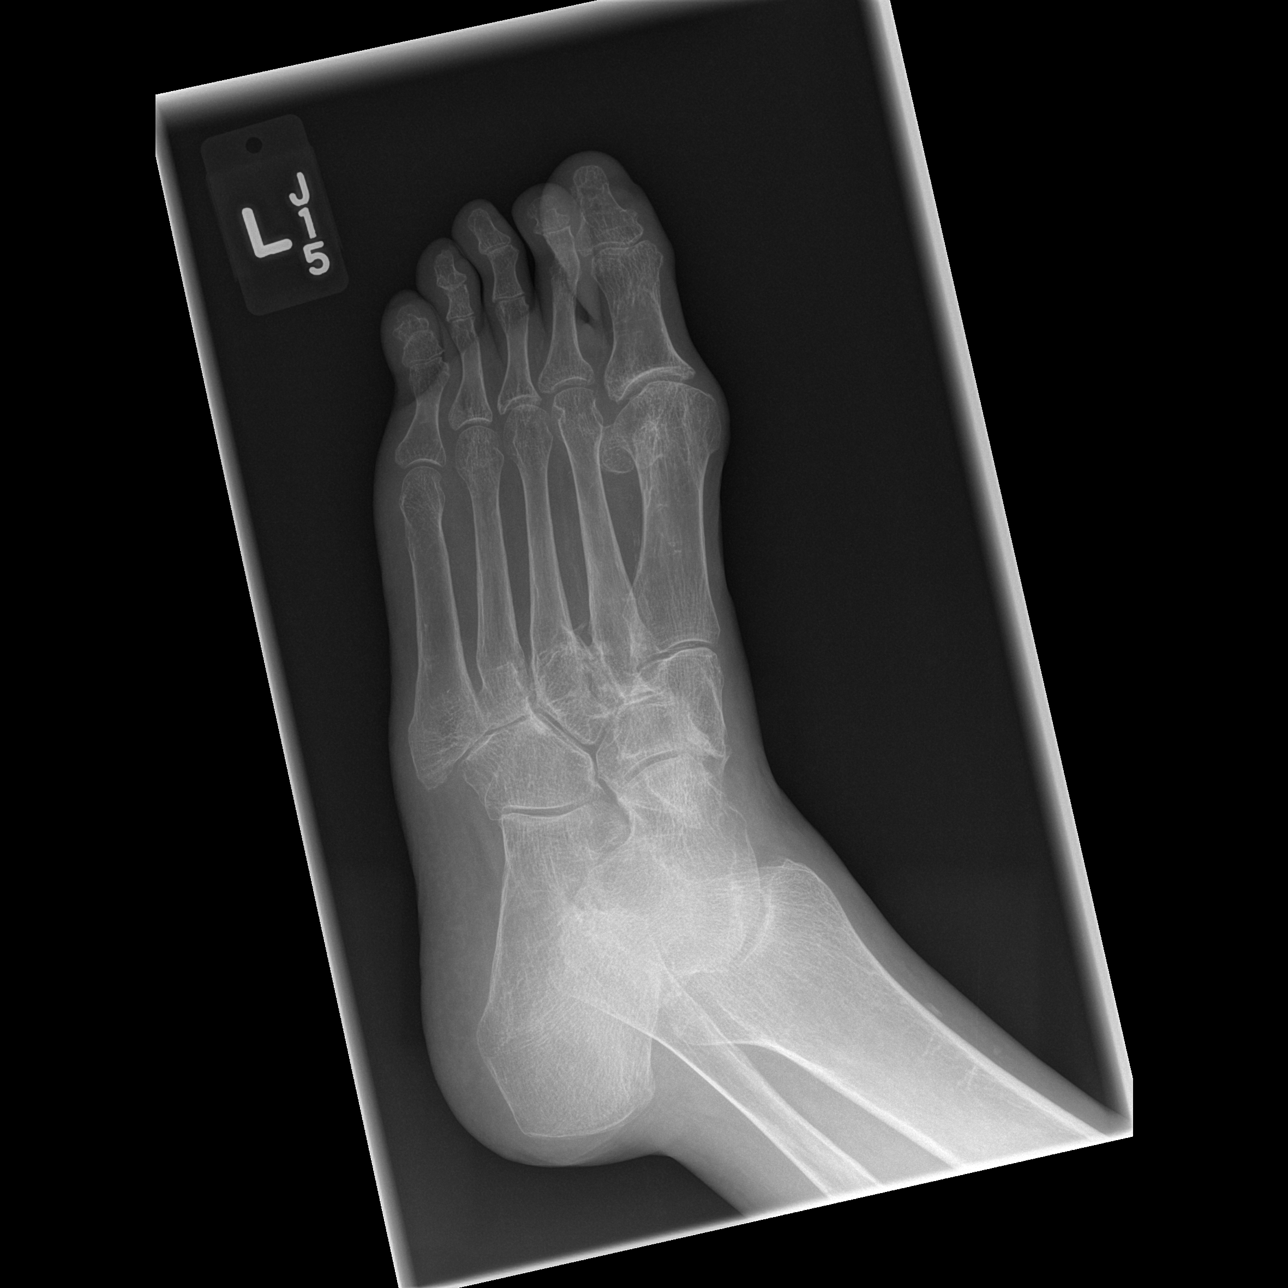

[t foot lat left]
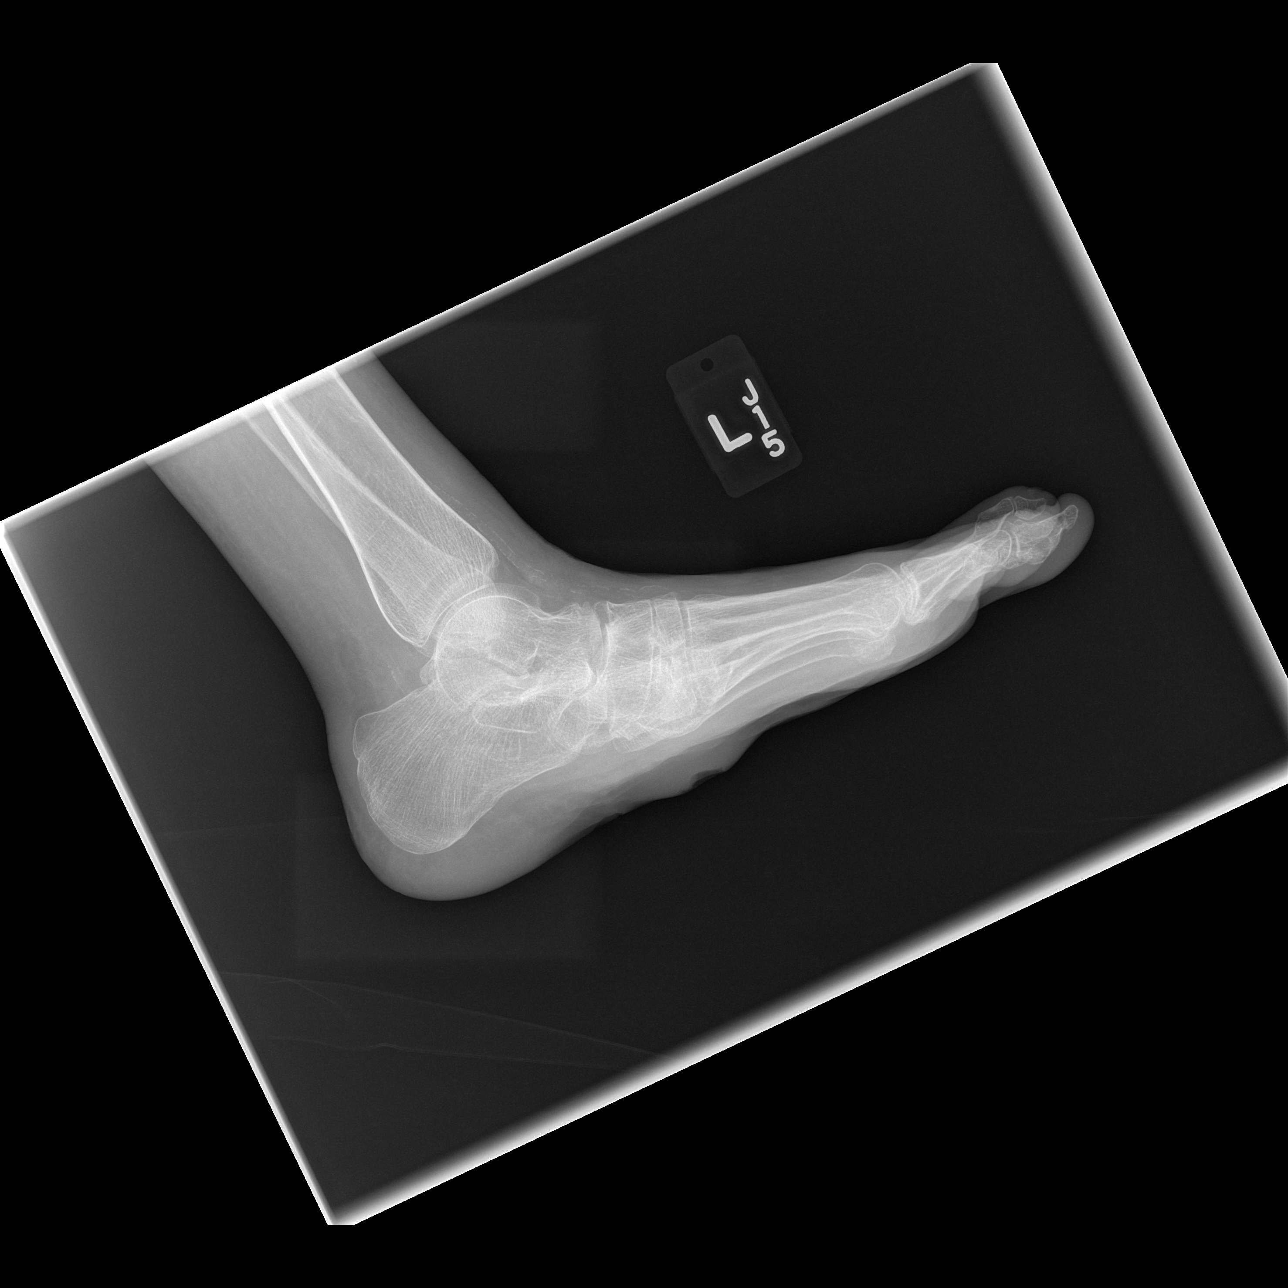

[3 of 3 positions shown; findings below may reference images not displayed]

FINDINGS: There is diffuse osteopenia. No acute fracture. There is chronic
changes and deformity of the middle phalanx of the second toe. A
skin ulcer is noted along the plantar aspect of the foot at the
tarsometatarsal junction. No soft tissue gas or evidence of bony
erosion identified. MRI may provide better evaluation if
osteomyelitis is clinically suspected.
IMPRESSION: Skin ulcer at the plantar aspect of the left foot. No soft tissue
gas.

Osteopenia.  No acute fracture or bony erosion.

## 2016-03-09 IMAGING — MR MR FOOT*L* W/O CM
4 of 5 series · 19 of 40 positions shown · non-contrast
Comparison: Multiple exams, including 09/04/2014

CLINICAL DATA: Plantar diabetic foot ulcer. Pain and blistering.
Cellulitis.

EXAM:
MRI OF THE LEFT FOREFOOT WITHOUT CONTRAST
TECHNIQUE: Multiplanar, multisequence MR imaging was performed. No intravenous
contrast was administered.

[Series 5: T1 · coronal · 4.0mm · 0.29mm/px · 9 of 38 slices shown (1 of 2)]
[im 1/38]
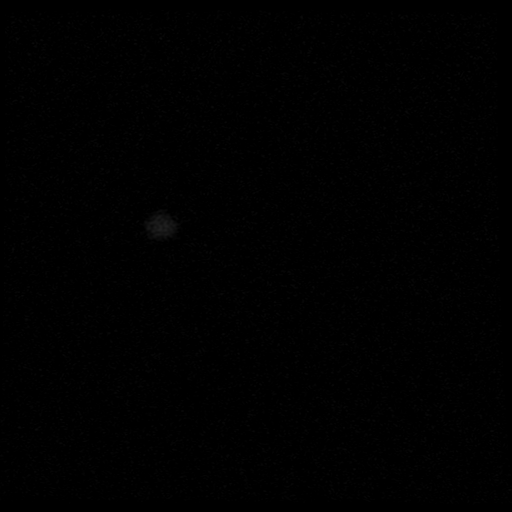
[im 7/38]
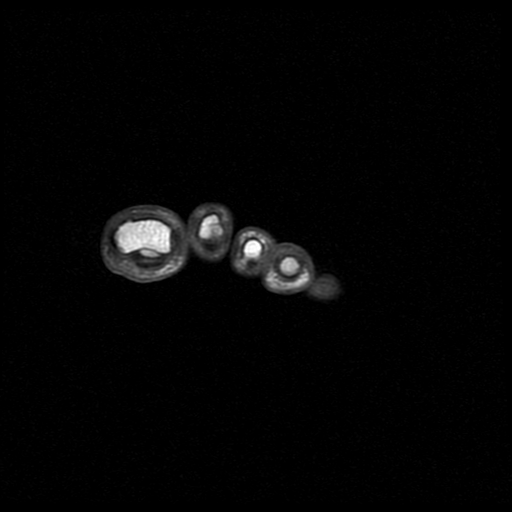
[im 11/38]
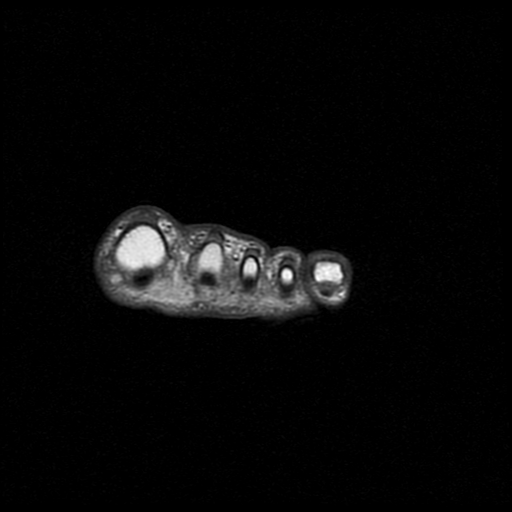
[im 17/38]
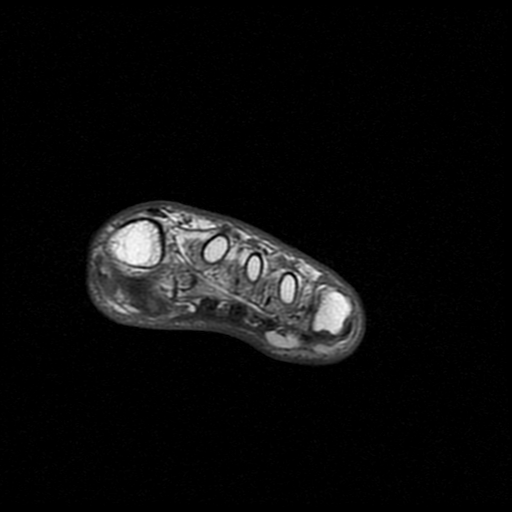
[im 21/38]
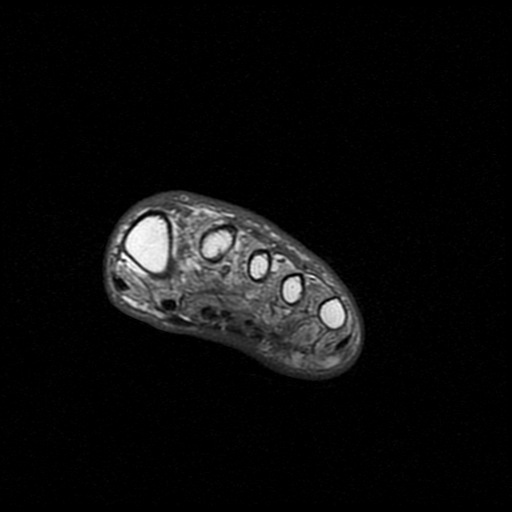
[im 27/38]
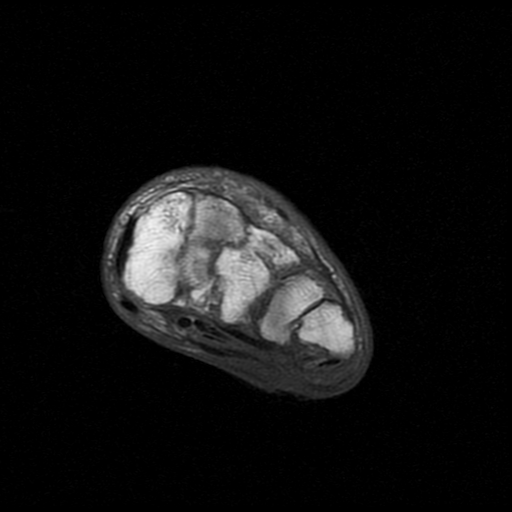
[im 31/38]
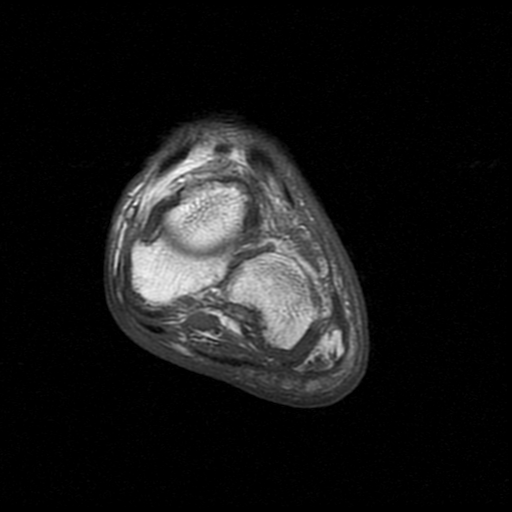
[im 34/38]
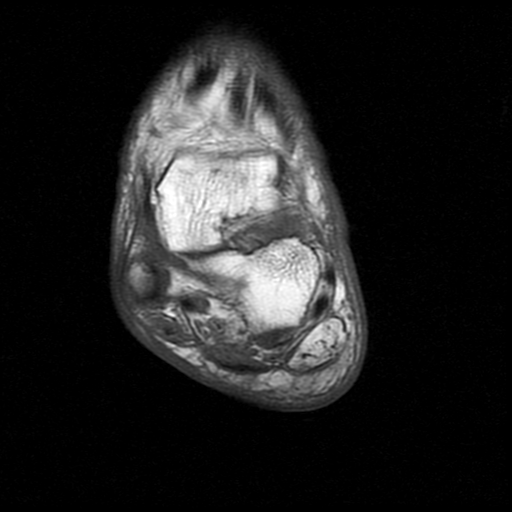
[im 38/38]
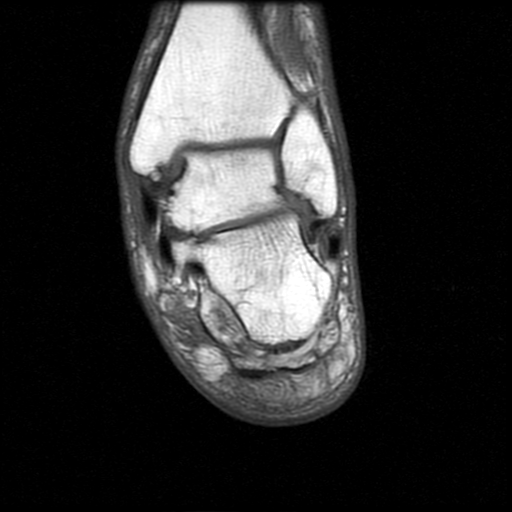

[Series 6: T2 · coronal · 4.0mm · 0.29mm/px · 3 of 38 slices shown]
[im 7/38]
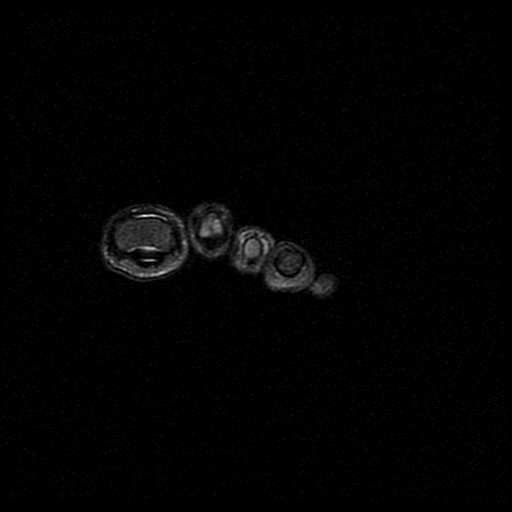
[im 21/38]
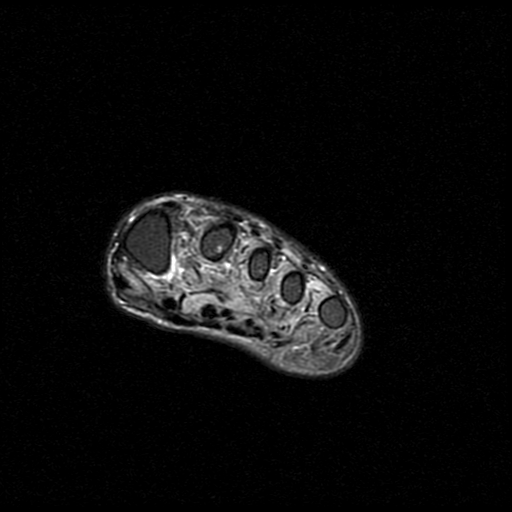
[im 34/38]
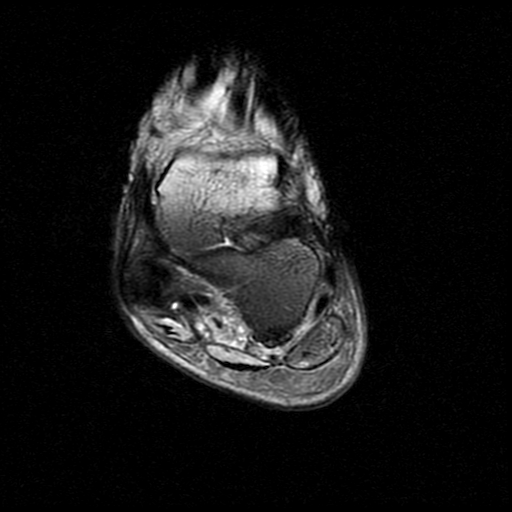

[Series 7: T1 · axial · 4.0mm · 0.39mm/px · z∈[-101,-39]mm · 4 of 15 slices shown (2 of 2)]
[im 1/15]
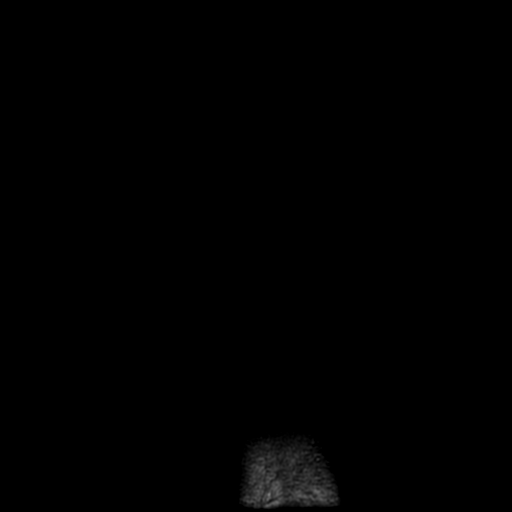
[im 4/15]
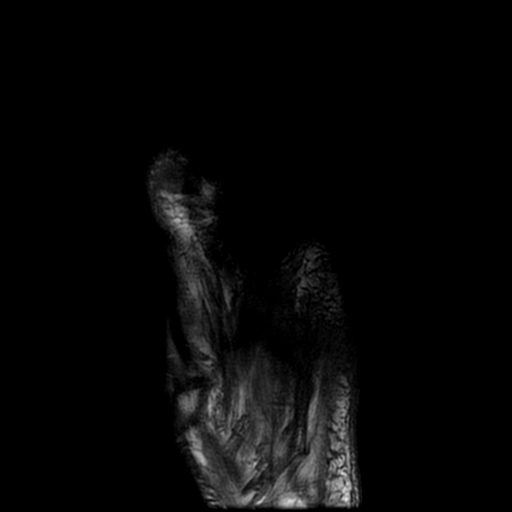
[im 8/15]
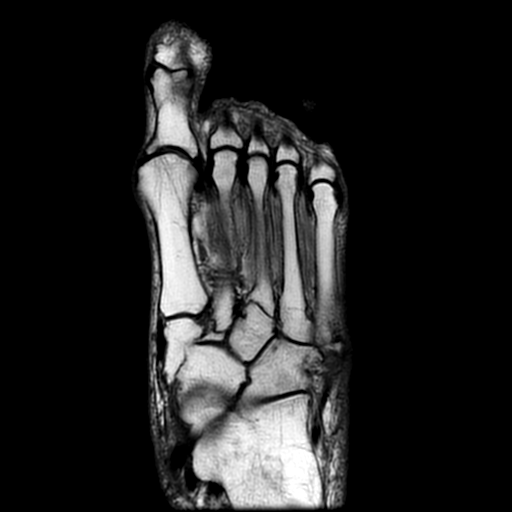
[im 15/15]
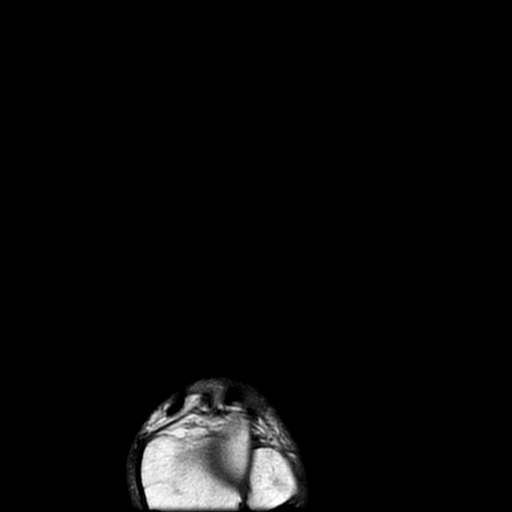

[Series 8: STIR · axial · 4.0mm · 0.39mm/px · z∈[-101,-39]mm · 3 of 15 slices shown]
[im 1/15]
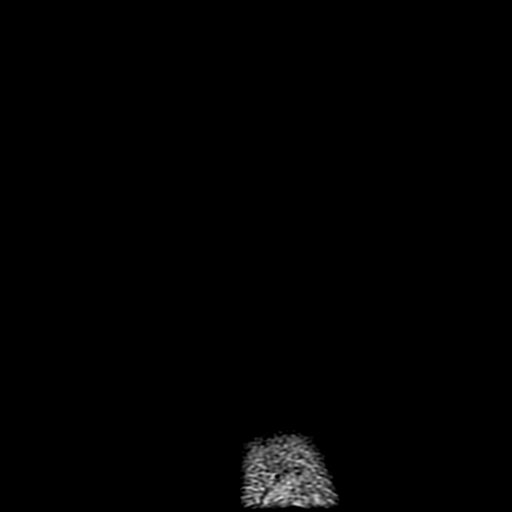
[im 8/15]
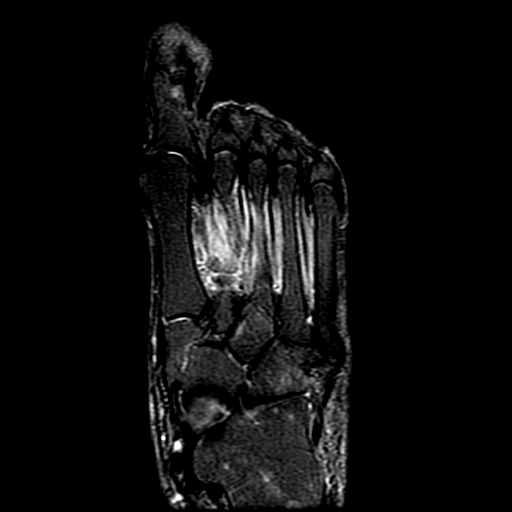
[im 15/15]
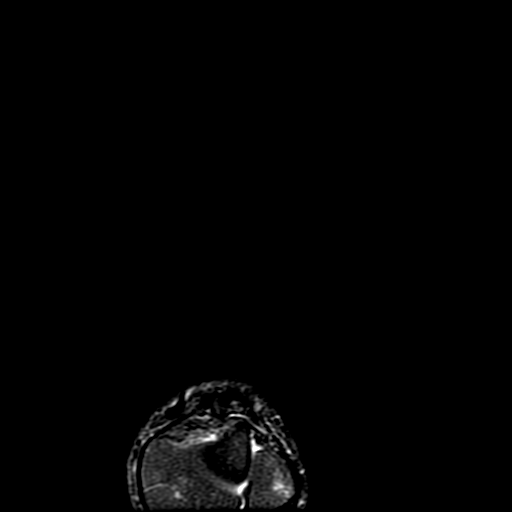

[19 of 40 positions shown; findings below may reference images not displayed]

FINDINGS: Plantar ulceration in skin disruption below the lateral portion of
the Lisfranc joint as on image 11 of series 5 with surrounding edema
in the subcutaneous tissues. The underlying peroneus longus tendon
appears normal, and no drainable abscess is observed in the foot.

Regional subcutaneous edema tracks primarily along the plantar foot
towards the toes, but also with some low-level edema tracking around
to the dorsal foot. Also low-level is the edema tracking within the
plantar foot musculature.

No significant marrow edema in the forefoot, midfoot, or visualize
part of the hindfoot to suggest osteomyelitis. There is some
speckled T2 high signal hyperintensities in the bone suggesting
osteoporosis of disuse.

The Lisfranc ligament appears intact. There appears to been fusion
of the navicular, medial cuneiform, and probably the middle
cuneiform. Pes planus noted.
IMPRESSION: 1. No abscess or osteomyelitis identified.
2. Pes planus with apparent fusion of the navicular, medial
cuneiform, and probably the middle cuneiform.
3. Continued skin ulceration plantar to the Lisfranc joint
laterally. Surrounding subcutaneous edema and cellulitis is
primarily plantar with some mild dorsal extension. Low-level
myositis along the plantar foot musculature.

## 2016-03-11 ENCOUNTER — Emergency Department (HOSPITAL_BASED_OUTPATIENT_CLINIC_OR_DEPARTMENT_OTHER)
Admission: EM | Admit: 2016-03-11 | Discharge: 2016-03-11 | Disposition: A | Discharge status: Home / Self Care | Payer: Medicare Other | Attending: Emergency Medicine | Admitting: Emergency Medicine

## 2016-03-11 ENCOUNTER — Encounter (HOSPITAL_BASED_OUTPATIENT_CLINIC_OR_DEPARTMENT_OTHER): Payer: Self-pay | Admitting: Emergency Medicine

## 2016-03-11 ENCOUNTER — Emergency Department (HOSPITAL_BASED_OUTPATIENT_CLINIC_OR_DEPARTMENT_OTHER): Payer: Medicare Other

## 2016-03-11 DIAGNOSIS — N183 Chronic kidney disease, stage 3 (moderate): Secondary | ICD-10-CM | POA: Diagnosis not present

## 2016-03-11 DIAGNOSIS — I129 Hypertensive chronic kidney disease with stage 1 through stage 4 chronic kidney disease, or unspecified chronic kidney disease: Secondary | ICD-10-CM | POA: Diagnosis not present

## 2016-03-11 DIAGNOSIS — Z794 Long term (current) use of insulin: Secondary | ICD-10-CM | POA: Insufficient documentation

## 2016-03-11 DIAGNOSIS — L03116 Cellulitis of left lower limb: Secondary | ICD-10-CM | POA: Diagnosis not present

## 2016-03-11 DIAGNOSIS — Z79899 Other long term (current) drug therapy: Secondary | ICD-10-CM | POA: Diagnosis not present

## 2016-03-11 DIAGNOSIS — Z87891 Personal history of nicotine dependence: Secondary | ICD-10-CM | POA: Diagnosis not present

## 2016-03-11 DIAGNOSIS — M79672 Pain in left foot: Secondary | ICD-10-CM | POA: Diagnosis present

## 2016-03-11 DIAGNOSIS — E1022 Type 1 diabetes mellitus with diabetic chronic kidney disease: Secondary | ICD-10-CM | POA: Diagnosis not present

## 2016-03-11 LAB — CBC WITH DIFFERENTIAL/PLATELET
BASOS ABS: 0 10*3/uL (ref 0.0–0.1)
Basophils Relative: 1 %
EOS PCT: 3 %
Eosinophils Absolute: 0.2 10*3/uL (ref 0.0–0.7)
HCT: 37 % (ref 36.0–46.0)
Hemoglobin: 12.3 g/dL (ref 12.0–15.0)
LYMPHS ABS: 1.5 10*3/uL (ref 0.7–4.0)
LYMPHS PCT: 17 %
MCH: 28.3 pg (ref 26.0–34.0)
MCHC: 33.2 g/dL (ref 30.0–36.0)
MCV: 85.3 fL (ref 78.0–100.0)
MONO ABS: 0.6 10*3/uL (ref 0.1–1.0)
Monocytes Relative: 6 %
Neutro Abs: 6.5 10*3/uL (ref 1.7–7.7)
Neutrophils Relative %: 73 %
PLATELETS: 418 10*3/uL — AB (ref 150–400)
RBC: 4.34 MIL/uL (ref 3.87–5.11)
RDW: 12.5 % (ref 11.5–15.5)
WBC: 8.9 10*3/uL (ref 4.0–10.5)

## 2016-03-11 LAB — BASIC METABOLIC PANEL
Anion gap: 10 (ref 5–15)
BUN: 58 mg/dL — AB (ref 6–20)
CALCIUM: 8.2 mg/dL — AB (ref 8.9–10.3)
CO2: 19 mmol/L — ABNORMAL LOW (ref 22–32)
CREATININE: 2.23 mg/dL — AB (ref 0.44–1.00)
Chloride: 108 mmol/L (ref 101–111)
GFR calc Af Amer: 30 mL/min — ABNORMAL LOW (ref >60)
GFR, EST NON AFRICAN AMERICAN: 26 mL/min — AB (ref >60)
GLUCOSE: 143 mg/dL — AB (ref 65–99)
Potassium: 5 mmol/L (ref 3.5–5.1)
Sodium: 137 mmol/L (ref 135–145)

## 2016-03-11 IMAGING — MR MR [PERSON_NAME] LOW W/O CM*L*
4 of 7 series · 18 of 40 positions shown · non-contrast
Comparison: Left foot MRI 10/13/2014.

CLINICAL DATA: Posterior left mid calf pain. Diabetes. Evaluate for
possible cellulitis. Initial encounter.

EXAM:
MRI OF LOWER LEFT EXTREMITY WITHOUT CONTRAST
TECHNIQUE: Multiplanar, multisequence MR imaging of the left lower leg was
performed. No intravenous contrast was administered.

[Series 3: T1 · axial · 5.0mm · 0.55mm/px · z∈[-314,+29]mm · 4 of 62 slices shown (1 of 2)]
[im 1/62]
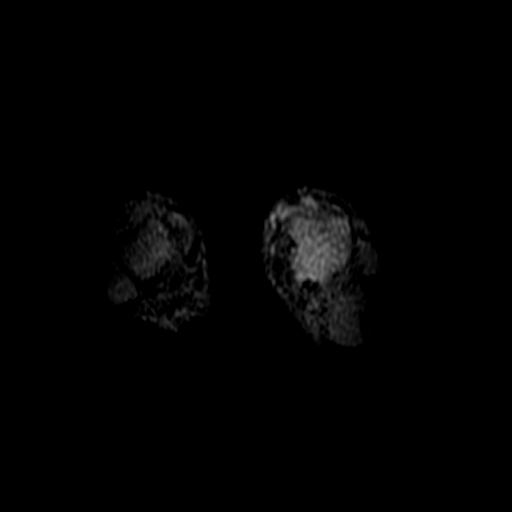
[im 8/62]
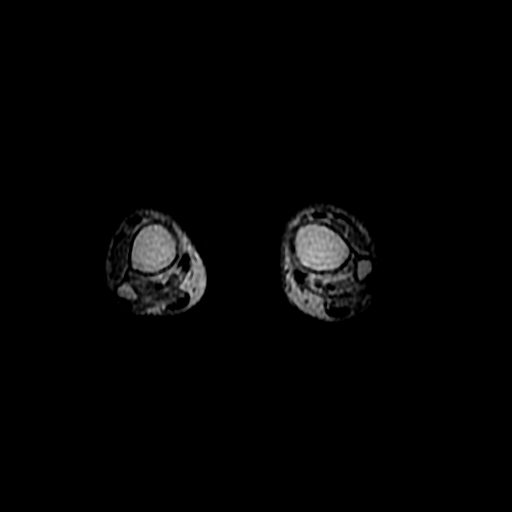
[im 31/62]
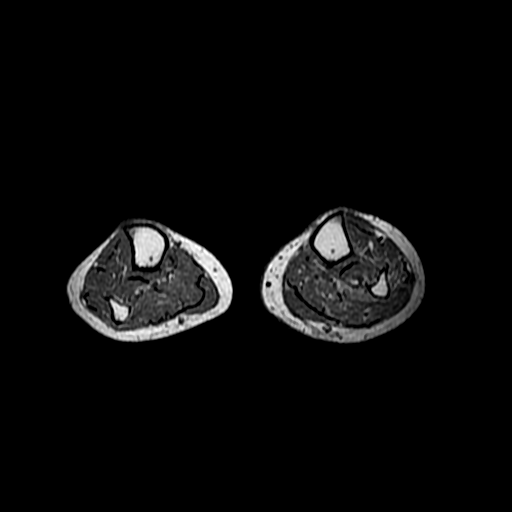
[im 54/62]
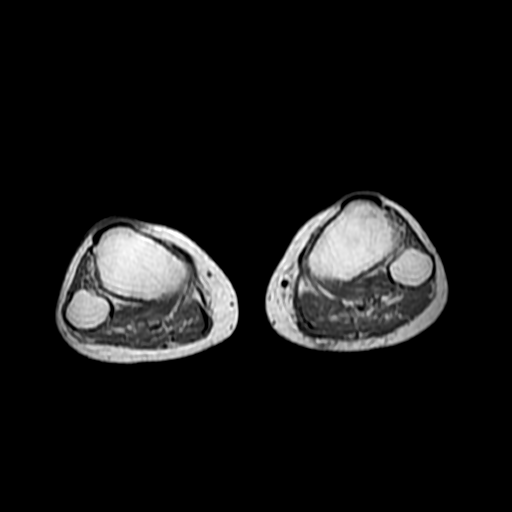

[Series 5: T2 fat-sat · axial · 5.0mm · 0.55mm/px · z∈[-314,+81]mm · 8 of 62 slices shown (1 of 2)]
[im 1/62]
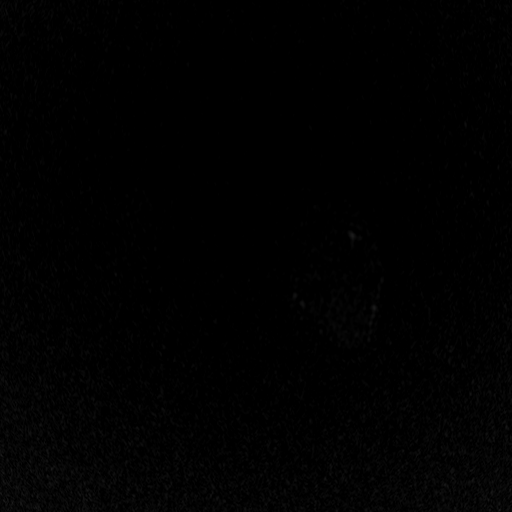
[im 9/62]
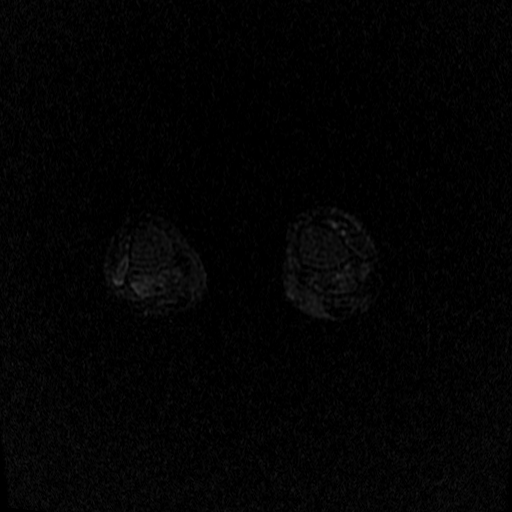
[im 18/62]
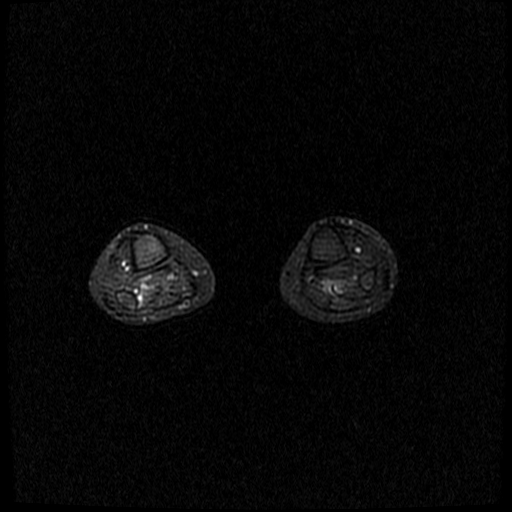
[im 27/62]
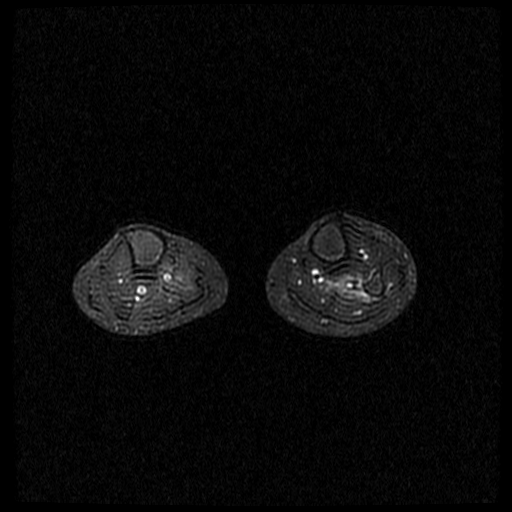
[im 35/62]
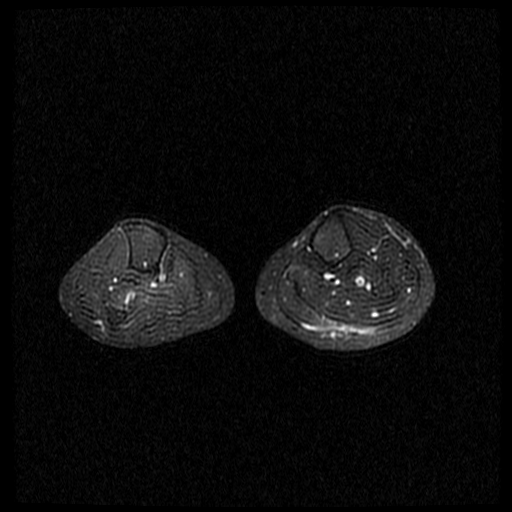
[im 44/62]
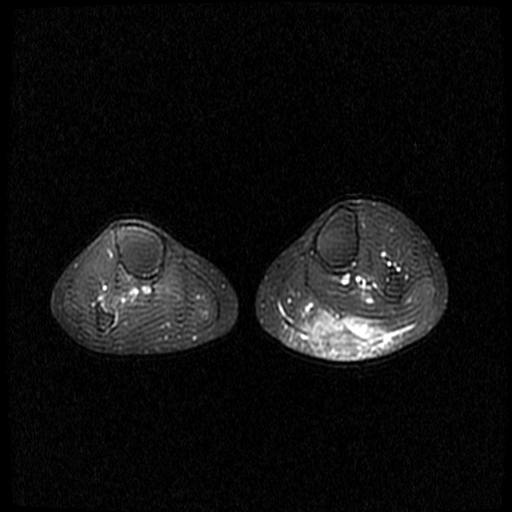
[im 53/62]
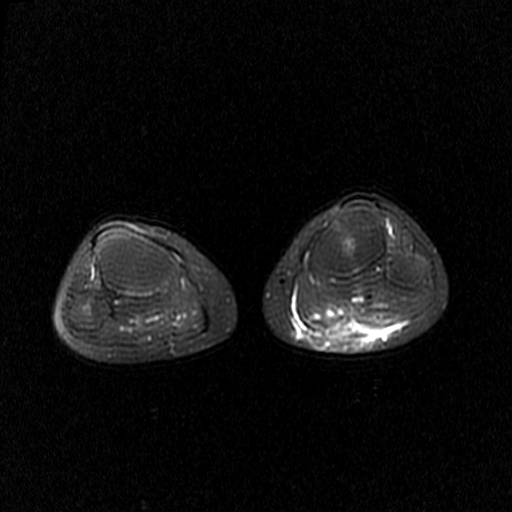
[im 62/62]
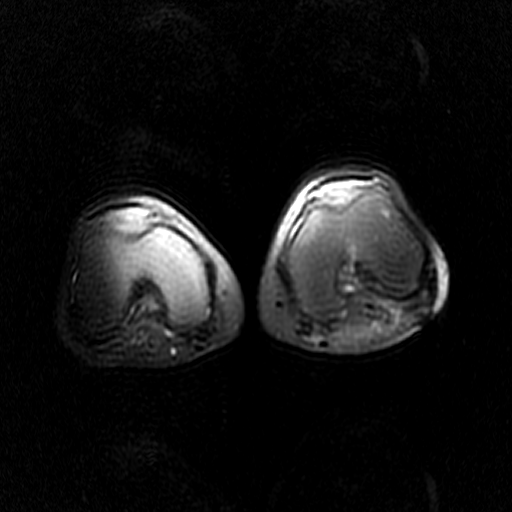

[Series 6: T1 · coronal · 4.0mm · 0.78mm/px · 3 of 32 slices shown (2 of 2)]
[im 1/32]
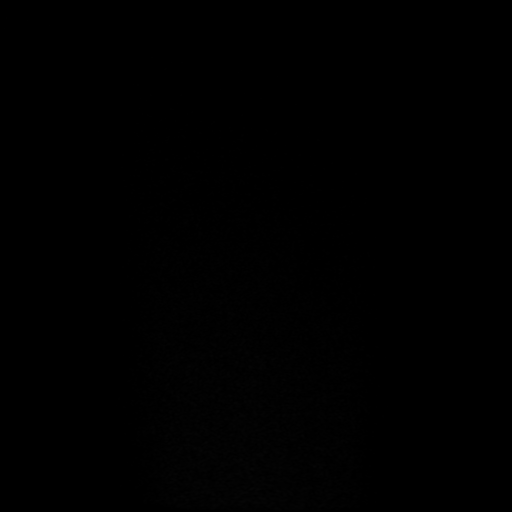
[im 21/32]
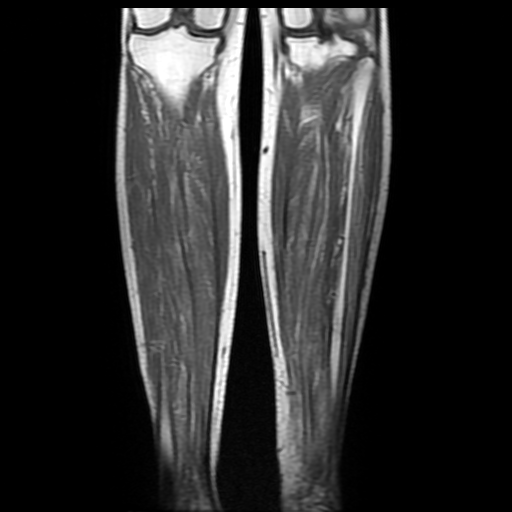
[im 32/32]
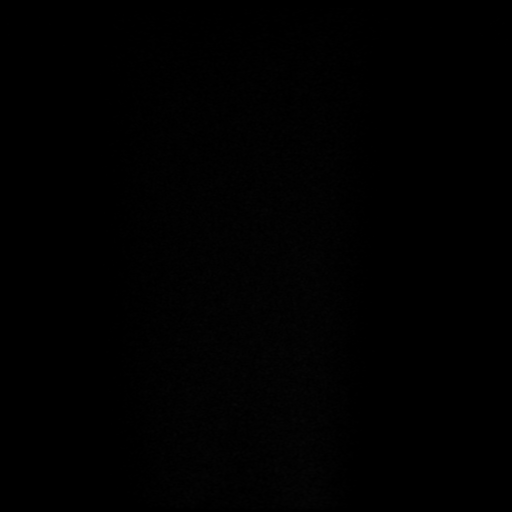

[Series 9: T2 fat-sat · sagittal · 5.0mm · 0.70mm/px · 3 of 22 slices shown (2 of 2)]
[im 1/22]
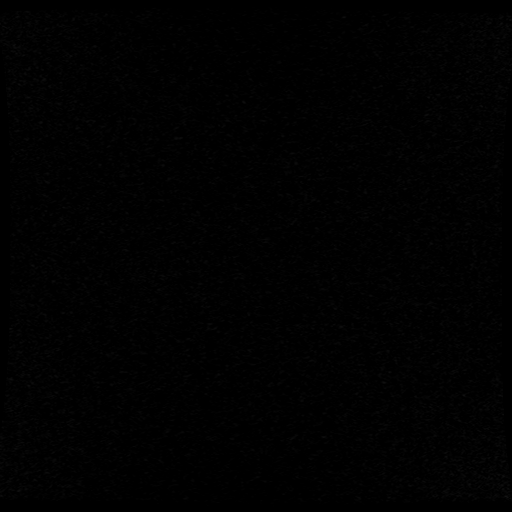
[im 11/22]
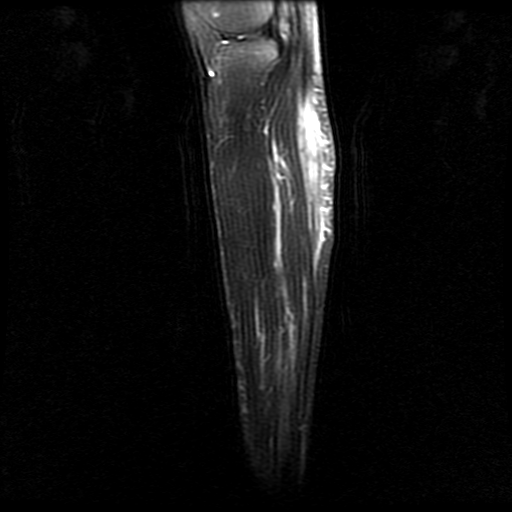
[im 22/22]
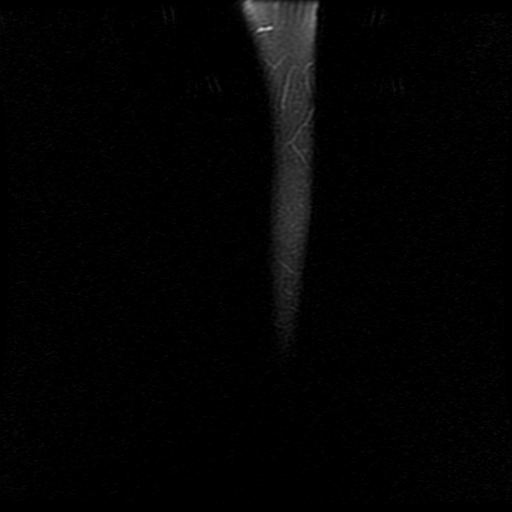

[18 of 40 positions shown; findings below may reference images not displayed]

FINDINGS: Study was performed using the body coil. Both lower legs are
included on the axial and coronal images.

There is prominent asymmetric subcutaneous edema posteriorly in the
proximal left lower leg. This abnormal T2 signal extends into the
medial and lateral heads of the gastrocnemius muscle. The soleus
muscle appears normal. There is no focal fluid collection. The
visualized Achilles tendon appears normal.

More distally within the left lower leg, there is ill-defined T2
hyperintensity within the musculature of the deep posterior
compartment. This abnormal signal surrounds the posterior tibial
vein. The vein at this level has altered signal and appears mildly
attenuated. DVT cannot be excluded. Minimal hyperintensity is noted
within the contralateral deep posterior compartment musculature.

No osseous abnormalities are identified.
IMPRESSION: 1. There is both muscular and subcutaneous T2 signal abnormality in
the left calf, likely posttraumatic or inflammatory. No focal fluid
collection or tendon tear identified.
2. Nonspecific lower level T2 hyperintensity more distally within
the deep posterior compartment of both lower legs. On the left, this
is perivascular in distribution and potentially secondary to
underlying DVT. Subacute denervation would be an additional
consideration. Doppler ultrasound correlation recommended.
3. No osseous abnormalities.
4. These results will be called to the ordering clinician or
representative by the Radiologist Assistant, and communication
documented in the PACS or zVision Dashboard.

## 2016-03-11 MED ORDER — DOXYCYCLINE HYCLATE 100 MG PO CAPS: 100.0000 mg | ORAL_CAPSULE | Freq: Two times a day (BID) | ORAL | 0 refills | Status: DC

## 2016-03-11 MED ORDER — DOXYCYCLINE HYCLATE 100 MG PO TABS
100.0000 mg | ORAL_TABLET | Freq: Two times a day (BID) | ORAL | Status: DC
  Administered 2016-03-11: 100 mg via ORAL
  Filled 2016-03-11: qty 1

## 2016-03-11 NOTE — Discharge Instructions (Signed)
Continue warm soaks on her foot. Follow up with your primary doctor in next few days to have the wound rechecked. Monitor for fever and increasing redness or other worsening symptoms.

## 2016-03-11 NOTE — ED Notes (Signed)
All previous notes and vital signs taken and entered by Apolonio Schneiders, RN, documented under user CW in error.

## 2016-03-11 NOTE — ED Triage Notes (Signed)
Pt c/o bilateral foot pain and diabetic ulcer to left foot.  Pt states "it feels like my bones are cracking as I walk."

## 2016-03-11 NOTE — ED Provider Notes (Signed)
[facility] Provider Note   CSN: [id number] Arrival date & time: 03/11/16  1702 By signing my name below, I, [name], attest that this documentation has been prepared under the direction and in the presence of Dorie Rank, MD. Electronically Signed: Georgette [name], ED Scribe. 03/11/16. 6:26 PM.  History   Chief Complaint Chief Complaint  Patient presents with  . Foot Pain   HPI Comments: [name] is a 45 y.o. female with h/o CKD, DVT, HTN, MRSA, neuropathy, and DM who presents to the Emergency Department complaining of bilateral foot pain onset a couple days ago. She states "it feels like my bones are cracking as I walk". Pt also notes she has a wound to her left foot with purulent drainage. Since draining, she has had increased pain in her left foot radiating up towards her knees. Pt has not taken any OTC medications PTA. She denies fever, chills, or any other associated symptoms.   The history is provided by the patient. No language interpreter was used.    Past Medical History:  Diagnosis Date  . Anemia   . C. difficile diarrhea 09/26/2014  . Cataracts, both eyes   . Cellulitis of right foot 06/02/2014  . CKD (chronic kidney disease) stage 3, GFR 30-59 ml/min   . Diabetic ulcer of right foot ([place]) 06/02/2014  . DVT (deep venous thrombosis) ([place]) 10/2014  . H/O seasonal allergies   . Hypercholesteremia   . Hypertension   . Left foot infection   . Neuropathy in diabetes ([place])   . Staphylococcus aureus bacteremia 10/2014  . Type 1 diabetes [facility])    onset age 9    Patient Active Problem List   Diagnosis Date Noted  . Boil of scalp 11/04/2015  . Boil of left cheek 11/04/2015  . Boil of lower abdomen 11/04/2015  . MRSA cellulitis 11/04/2015  . Hyponatremia 11/03/2015  . Bacterial folliculitis 63/78/5885  . Heart murmur 11/03/2015  . Protein-calorie malnutrition, moderate ([place]) 10/18/2014  . Left leg DVT ([place]) 10/18/2014  . DVT of upper extremity (deep vein  thrombosis) ([place])   . Staphylococcus aureus bacteremia   . Cellulitis of left foot 10/13/2014  . Type 1 diabetes mellitus with hyperglycemia ([place]) 10/13/2014  . Chronic anemia 10/13/2014  . Hyperkalemia 10/06/2014  . Bilateral lower extremity edema 10/04/2014  . 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 Diabetic ulcer of left foot associated with type 1 diabetes mellitus ([place]) 09/26/2014  . Acute kidney injury superimposed on chronic kidney disease ([place]) 06/13/2014  . DM type 1 causing renal disease ([place]) 06/13/2013  . Osteomyelitis ([place]) 06/12/2013  . CKD (chronic kidney disease), stage III 06/12/2013  . UTI (urinary tract infection) 03/25/2013  . CAP (community acquired pneumonia) 03/25/2013  . Acute blood loss anemia 03/25/2013  . Acidosis 03/25/2013  . Leukocytosis, unspecified 03/25/2013  . Iron deficiency anemia 05/09/2011  . Charcot foot due to diabetes mellitus ([place]) 05/09/2011  . Foot ulcer due to secondary DM ([place]) 05/09/2011  . Type 1 diabetes mellitus with neurological manifestations, uncontrolled ([place])   . Hypertension   . Hypercholesteremia     Past Surgical History:  Procedure Laterality Date  . CESAREAN SECTION    . EYE SURGERY    . FOOT AMPUTATION THROUGH METATARSAL Left   . hemrrhoidectomy    . I&D EXTREMITY Right 06/10/2014   Procedure: IRRIGATION AND DEBRIDEMENT Right Foot;  Surgeon: Newt Minion, MD;  Location: [facility];  Service: Orthopedics;  Laterality: Right;  . PERIPHERALLY INSERTED CENTRAL CATHETER INSERTION    . SKIN SPLIT GRAFT Right 06/15/2014   Procedure: SPLIT THICKNESS SKIN GRAFT RIGHT FOOT;  Surgeon: Newt Minion, MD;  Location: [facility];  Service: Orthopedics;  Laterality: Right;  . TRANSMETATARSAL AMPUTATION Right     OB History    No data available       Home Medications    Prior to Admission medications   Medication Sig Start Date End Date Taking? Authorizing Provider  albuterol (PROVENTIL HFA;VENTOLIN  HFA) 108 (90 BASE) MCG/ACT inhaler Inhale 2 puffs into the lungs every 6 (six) hours as needed. Reported on 04/15/2015   Yes Historical Provider, MD  amLODipine (NORVASC) 10 MG tablet Take 10 mg by mouth daily.   Yes Historical Provider, MD  calcitRIOL (ROCALTROL) 0.25 MCG capsule Take 0.25 mcg by mouth daily. Reported on 04/15/2015   Yes Historical Provider, MD  ferrous sulfate 325 (65 FE) MG tablet Take 1 tablet (325 mg total) by mouth 3 (three) times daily with meals. Reported on 04/15/2015 04/18/15  Yes Elayne Snare, MD  folic acid (FOLVITE) 1 MG tablet Take 1 mg by mouth daily. Reported on 04/15/2015   Yes Historical Provider, MD  furosemide (LASIX) 40 MG tablet Take 40 mg by mouth daily as needed.   Yes Historical Provider, MD  gabapentin (NEURONTIN) 100 MG capsule Take 1 capsule (100 mg total) by mouth 3 (three) times daily. 04/18/15  Yes Elayne Snare, MD  gabapentin (NEURONTIN) 300 MG capsule Take 300 mg by mouth every 12 (twelve) hours as needed.   Yes Historical Provider, MD  glucose blood (BAYER CONTOUR NEXT TEST) test strip Use as instructed to check blood sugar 4 times per day dx code E10.65 12/07/15  Yes Elayne Snare, MD  mupirocin ointment (BACTROBAN) 2 % Place 1 application into the nose 2 (two) times daily.   Yes Historical Provider, MD  norethindrone (AYGESTIN) 5 MG tablet Take 1 tablet by mouth daily. 10/17/15  Yes Historical Provider, MD  NOVOLOG 100 UNIT/ML injection USE MAX 65 UNITS PER DAY WITH PUMP 02/16/15  Yes Elayne Snare, MD  pravastatin (PRAVACHOL) 40 MG tablet Take 1 tablet (40 mg total) by mouth daily. 01/09/16  Yes Elayne Snare, MD  pyridoxine (B-6) 100 MG tablet Take 100 mg by mouth daily. Reported on 04/15/2015   Yes Historical Provider, MD  vitamin B-12 (CYANOCOBALAMIN) 100 MCG tablet Take 200 mcg by mouth daily. Reported on 04/15/2015   Yes Historical Provider, MD  vitamin C (ASCORBIC ACID) 500 MG tablet Take 500 mg by mouth daily.   Yes Historical Provider, MD  Vitamin D,  Ergocalciferol, (DRISDOL) 50000 units CAPS capsule Take 1 capsule (50,000 Units total) by mouth every 7 (seven) days. 07/18/15  Yes Elayne Snare, MD  cyclobenzaprine (FLEXERIL) 10 MG tablet Take 1 tablet (10 mg total) by mouth 3 (three) times daily as needed for muscle spasms. 02/24/16   Ripley Fraise, MD  doxycycline (VIBRAMYCIN) 100 MG capsule Take 1 capsule (100 mg total) by mouth 2 (two) times daily. 03/11/16   Dorie Rank, MD  HYDROcodone-acetaminophen (NORCO) 10-325 MG tablet Take 1 tablet by mouth every 6 (six) hours as needed.    Historical Provider, MD    Family History Family History  Problem Relation Age of Onset  . Hyperlipidemia Mother   . Dementia Mother     Social History Social History  Substance Use Topics  . Smoking status: Former Smoker    Quit date: 05/16/2008  .  Smokeless tobacco: Never Used  . Alcohol use 0.5 oz/week    1 Standard drinks or equivalent per week     Comment: Occasional     Allergies   Lisinopril; Ciprofloxacin; and Cleocin [clindamycin hcl]   Review of Systems Review of Systems  Constitutional: Negative for chills and fever.  Musculoskeletal: Positive for arthralgias and myalgias.  All other systems reviewed and are negative.    Physical Exam Updated Vital Signs BP 120/75 (BP Location: Right Arm)   Pulse 90   Temp 98.5 F (36.9 C) (Oral)   Resp 19   Ht 5\' 7"  (1.702 m)   Wt 61.2 kg   LMP 01/15/2016 (Exact Date)   SpO2 97%   BMI 21.14 kg/m   Physical Exam  Constitutional: She appears well-developed and well-nourished. No distress.  HENT:  Head: Normocephalic and atraumatic.  Right Ear: External ear normal.  Left Ear: External ear normal.  Eyes: Conjunctivae are normal. Right eye exhibits no discharge. Left eye exhibits no discharge. No scleral icterus.  Neck: Neck supple. No tracheal deviation present.  Cardiovascular: Normal rate.   Pulmonary/Chest: Effort normal. No stridor. No respiratory distress.  Abdominal: She exhibits  no distension.  Musculoskeletal: She exhibits no edema or deformity.  Right foot s/p amputation. Distal mid foot, no erythema, no deformity. Skin is warm and well perfused. Left foot amputation of several toes; small 2-64mm ulceration wound lateral aspect left foot with mild erythema surrounding area with small amount of purulence expressed with palpation. No lymphangitic streaking, no cyanosis.   Neurological: She is alert. Cranial nerve deficit: no gross deficits.  Skin: Skin is warm and dry. No rash noted.  Psychiatric: She has a normal mood and affect.  Nursing note and vitals reviewed.    ED Treatments / Results  DIAGNOSTIC STUDIES: Oxygen Saturation is 98% on RA, normal by my interpretation.    COORDINATION OF CARE: 6:26 PM Discussed treatment plan with pt at bedside and pt agreed to plan.  Labs (all labs ordered are listed, but only abnormal results are displayed) Labs Reviewed  CBC WITH DIFFERENTIAL/PLATELET - Abnormal; Notable for the following:       Result Value   Platelets 418 (*)    All other components within normal limits  BASIC METABOLIC PANEL - Abnormal; Notable for the following:    CO2 19 (*)    Glucose, Bld 143 (*)    BUN 58 (*)    Creatinine, Ser 2.23 (*)    Calcium 8.2 (*)    GFR calc non Af Amer 26 (*)    GFR calc Af Amer 30 (*)    All other components within normal limits     Radiology Dg Foot Complete Left  Result Date: 03/11/2016 CLINICAL DATA:  Foot pain, no known injury, initial encounter EXAM: LEFT FOOT - COMPLETE 3+ VIEW COMPARISON:  10/12/2014 FINDINGS: There is been interval resection of the fifth toe including the fifth metatarsal. The distal phalanx of the first toe has been removed as well. Mild osteopenia is seen. No acute fracture or dislocation is noted. Mild soft tissue swelling is seen. IMPRESSION: Postsurgical changes. Soft tissue swelling without acute bony abnormality. Electronically Signed   By: Inez Catalina M.D.   On: 03/11/2016  18:57   Dg Foot Complete Right  Result Date: 03/11/2016 CLINICAL DATA:  Bilateral foot pain EXAM: RIGHT FOOT COMPLETE - 3+ VIEW COMPARISON:  09/02/2014 FINDINGS: There is been transmetatarsal amputation of the right foot. The residual metatarsals appear within normal  limits. No bony erosion to suggest osteomyelitis is noted. No acute fracture is seen. IMPRESSION: Postsurgical changes. No findings to suggest osteomyelitis are seen. Electronically Signed   By: Inez Catalina M.D.   On: 03/11/2016 18:57    Procedures Procedures (including critical care time)  Medications Ordered in ED Medications  doxycycline (VIBRA-TABS) tablet 100 mg (not administered)     Initial Impression / Assessment and Plan / ED Course  I have reviewed the triage vital signs and the nursing notes.  Pertinent labs & imaging results that were available during my care of the patient were reviewed by me and considered in my medical decision making (see chart for details).  Clinical Course as of Mar 12 2007  Nancy Fetter Mar 11, 2016  1937 Fluctuating renal insufficiency compared to previous labs.  CBC normal.  [JK]  1937 No acute findings on xray.  Will treat with oral abx, warm soaks.  Follow up with PCP  [JK]    Clinical Course User Index [JK] Dorie Rank, MD    Labs are reassuring.  Suspect early cellulitis.  Will start on doxy to cover MRSA considering there purulent drainage at the small wound.  DIscussed close follow up with PCP  Final Clinical Impressions(s) / ED Diagnoses   Final diagnoses:  Cellulitis of left lower extremity    New Prescriptions New Prescriptions   DOXYCYCLINE (VIBRAMYCIN) 100 MG CAPSULE    Take 1 capsule (100 mg total) by mouth 2 (two) times daily.   I personally performed the services described in this documentation, which was scribed in my presence.  The recorded information has been reviewed and is accurate.     Dorie Rank, MD 03/11/16 2009

## 2016-03-13 ENCOUNTER — Other Ambulatory Visit: Payer: Self-pay | Admitting: Endocrinology

## 2016-03-14 ENCOUNTER — Other Ambulatory Visit: Payer: Self-pay | Admitting: Endocrinology

## 2016-03-14 NOTE — Patient Instructions (Signed)
Test blood sugar before all meals and at bedtime Take insulin before all meals

## 2016-03-23 DIAGNOSIS — F419 Anxiety disorder, unspecified: Secondary | ICD-10-CM | POA: Insufficient documentation

## 2016-03-23 DIAGNOSIS — F424 Excoriation (skin-picking) disorder: Secondary | ICD-10-CM | POA: Insufficient documentation

## 2016-04-04 ENCOUNTER — Ambulatory Visit (INDEPENDENT_AMBULATORY_CARE_PROVIDER_SITE_OTHER): Payer: Medicare Other | Admitting: Podiatry

## 2016-04-04 ENCOUNTER — Ambulatory Visit (INDEPENDENT_AMBULATORY_CARE_PROVIDER_SITE_OTHER): Payer: Medicare Other

## 2016-04-04 DIAGNOSIS — L02612 Cutaneous abscess of left foot: Secondary | ICD-10-CM | POA: Diagnosis not present

## 2016-04-04 DIAGNOSIS — E0843 Diabetes mellitus due to underlying condition with diabetic autonomic (poly)neuropathy: Secondary | ICD-10-CM

## 2016-04-04 DIAGNOSIS — R52 Pain, unspecified: Secondary | ICD-10-CM

## 2016-04-04 DIAGNOSIS — L03119 Cellulitis of unspecified part of limb: Principal | ICD-10-CM

## 2016-04-04 DIAGNOSIS — L03116 Cellulitis of left lower limb: Secondary | ICD-10-CM | POA: Diagnosis not present

## 2016-04-04 DIAGNOSIS — E1143 Type 2 diabetes mellitus with diabetic autonomic (poly)neuropathy: Secondary | ICD-10-CM

## 2016-04-04 DIAGNOSIS — E08621 Diabetes mellitus due to underlying condition with foot ulcer: Secondary | ICD-10-CM

## 2016-04-04 DIAGNOSIS — I70245 Atherosclerosis of native arteries of left leg with ulceration of other part of foot: Secondary | ICD-10-CM

## 2016-04-04 DIAGNOSIS — L97509 Non-pressure chronic ulcer of other part of unspecified foot with unspecified severity: Secondary | ICD-10-CM

## 2016-04-04 DIAGNOSIS — L02619 Cutaneous abscess of unspecified foot: Secondary | ICD-10-CM

## 2016-04-04 MED ORDER — DOXYCYCLINE HYCLATE 100 MG PO TABS: 100.0000 mg | ORAL_TABLET | Freq: Two times a day (BID) | ORAL | 0 refills | Status: DC

## 2016-04-08 LAB — WOUND CULTURE
GRAM STAIN: NONE SEEN
Gram Stain: NONE SEEN

## 2016-04-09 ENCOUNTER — Telehealth: Payer: Self-pay | Admitting: *Deleted

## 2016-04-09 ENCOUNTER — Ambulatory Visit (INDEPENDENT_AMBULATORY_CARE_PROVIDER_SITE_OTHER): Payer: Medicare Other | Admitting: Podiatry

## 2016-04-09 DIAGNOSIS — E0843 Diabetes mellitus due to underlying condition with diabetic autonomic (poly)neuropathy: Secondary | ICD-10-CM

## 2016-04-09 DIAGNOSIS — Z22322 Carrier or suspected carrier of Methicillin resistant Staphylococcus aureus: Secondary | ICD-10-CM

## 2016-04-09 DIAGNOSIS — I70245 Atherosclerosis of native arteries of left leg with ulceration of other part of foot: Secondary | ICD-10-CM

## 2016-04-09 DIAGNOSIS — E1143 Type 2 diabetes mellitus with diabetic autonomic (poly)neuropathy: Secondary | ICD-10-CM

## 2016-04-09 DIAGNOSIS — E08621 Diabetes mellitus due to underlying condition with foot ulcer: Secondary | ICD-10-CM

## 2016-04-09 DIAGNOSIS — L02612 Cutaneous abscess of left foot: Secondary | ICD-10-CM | POA: Diagnosis not present

## 2016-04-09 DIAGNOSIS — L97509 Non-pressure chronic ulcer of other part of unspecified foot with unspecified severity: Secondary | ICD-10-CM

## 2016-04-09 MED ORDER — SULFAMETHOXAZOLE-TRIMETHOPRIM 800-160 MG PO TABS: 1.0000 | ORAL_TABLET | Freq: Two times a day (BID) | ORAL | 1 refills | Status: DC

## 2016-04-09 NOTE — Patient Instructions (Signed)
Pre-Operative Instructions  Congratulations, you have decided to take an important step to improving your quality of life.  You can be assured that the doctors of [facility] will be with you every step of the way.  1. Plan to be at the surgery center/hospital at least 1 (one) hour prior to your scheduled time unless otherwise directed by the surgical center/hospital staff.  You must have a responsible adult accompany you, remain during the surgery and drive you home.  Make sure you have directions to the surgical center/hospital and know how to get there on time. 2. For hospital based surgery you will need to obtain a history and physical form from your family physician within 1 month prior to the date of surgery- we will give you a form for you primary physician.  3. We make every effort to accommodate the date you request for surgery.  There are however, times where surgery dates or times have to be moved.  We will contact you as soon as possible if a change in schedule is required.   4. No Aspirin/Ibuprofen for one week before surgery.  If you are on aspirin, any non-steroidal anti-inflammatory medications (Mobic, Aleve, Ibuprofen) you should stop taking it 7 days prior to your surgery.  You make take Tylenol  For pain prior to surgery.  5. Medications- If you are taking daily heart and blood pressure medications, seizure, reflux, allergy, asthma, anxiety, pain or diabetes medications, make sure the surgery center/hospital is aware before the day of surgery so they may notify you which medications to take or avoid the day of surgery. 6. No food or drink after midnight the night before surgery unless directed otherwise by surgical center/hospital staff. 7. No alcoholic beverages 24 hours prior to surgery.  No smoking 24 hours prior to or 24 hours after surgery. 8. Wear loose pants or shorts- loose enough to fit over bandages, boots, and casts. 9. No slip on shoes, sneakers are best. 10. Bring  your boot with you to the surgery center/hospital.  Also bring crutches or a walker if your physician has prescribed it for you.  If you do not have this equipment, it will be provided for you after surgery. 11. If you have not been contracted by the surgery center/hospital by the day before your surgery, call to confirm the date and time of your surgery. 12. Leave-time from work may vary depending on the type of surgery you have.  Appropriate arrangements should be made prior to surgery with your employer. 13. Prescriptions will be provided immediately following surgery by your doctor.  Have these filled as soon as possible after surgery and take the medication as directed. 14. Remove nail polish on the operative foot. 15. Wash the night before surgery.  The night before surgery wash the foot and leg well with the antibacterial soap provided and water paying special attention to beneath the toenails and in between the toes.  Rinse thoroughly with water and dry well with a towel.  Perform this wash unless told not to do so by your physician.  Enclosed: 1 Ice pack (please put in freezer the night before surgery)   1 Hibiclens skin cleaner   Pre-op Instructions  If you have any questions regarding the instructions, do not hesitate to call our office.  [place]: [address] [place], [place] [address] [phone]  [place]: [address] [place], [place] [address] [phone]  [place]: [address]  [place], [place] [address] [phone]   Dr.   Norman Regal DPM, Dr. Matthew Wagoner DPM, Dr. M. Todd Hyatt DPM, Dr. Titorya Stover DPM 

## 2016-04-09 NOTE — Telephone Encounter (Addendum)
Informed pt [name] reviewed the wound culture results and she is +for MRSA, and he would like to change her antibiotic and for her to make an appt to come in as soon as possible. Transferred pt to schedulers. 04/17/2016-Pt asked if she could walk on her foot for her surgery appt tomorrow. I told pt if [name] had her non-weight bearing then she needed to remain that way until he gave orders. Pt states she is using a Rollinator and how was she to drive, she has a stick shift. I told her she needed to get someone to bring her. 04/24/2016-Pt states she has been vomiting since Friday, and is not sure if the antibiotic is causing the problem. I told pt to stop the Bactrim DS, eat light meals of crackers, toast, white rice and ginger ale until able to keep food down. I told pt I would inform [name] and call again. Pt states the has a medication for nausea that she can put under her tongue and will try that today, I told her that was a good idea. Pt also requested refill of her pain medication which she is out of and not taking. Left message informing pt of [name] orders and to pick up the Vicodin 10/325 rx in the [place] office. 04/26/2016-Pt states [name] refilled the wrong medication. I spoke with pt and she states what he prescribed is not the same as before and not strong enough. I told pt I reviewed her clinicals and saw no other narcotic pain medication, but would inform [name]. Pt states the other one may have come from [place]. 04/27/2016-I informed pt of [name] 04/26/2016 7:14pm statement. Pt states she doesn't have the bottle for the medication she wanted, because she often pour similar medications into the same bottle. I told pt that was not a good idea. Pt states understanding and will talk with [name] at next appt. 05/17/2016-Pt states [name] had wanted her to have dressing changes every other day, has he ordered home health care. 05/21/2016-Tara Roxana Hires states pt  refused home health care visits and dressing changes. I spoke with pt she states she has home health care with [facility] [phone]. Left message for Hardie Shackleton - [facility] to call for wound care orders. Estill Bamberg - [facility] states fax orders to [phone]. Faxed handwritten orders of 05/18/2016 on prescription to [facility]. 05/23/2016-[facility] request refill of Doxycyline and Sulfa. 05/24/2016-Left message informing pt of [name] orders 05/23/2016 and requested pt call and make an appt between now and her appt with [facility]. 05/29/2016-Pt states she is thinks she is having symptoms of C-Diff. I asked pt to explain her symptoms and she states she had diarrhea on Friday and it stopped Saturday, then began Sunday. I asked pt if she had other symptoms than diarrhea and she denied other gastrointestinal symptoms. I told her if symptoms worsened if bloating, pain and bloody diarrhea she should go to ER or her PCP. Pt states understanding. 06/05/2016-[name] states needs pathology results and x-ray results. I spoke with [facility] she states they have no results for pt's surgery 04/11/2016.

## 2016-04-09 NOTE — Progress Notes (Signed)
Subjective: Patient presents today for follow-up evaluation of an ulceration to the lateral aspect of the patient's left ankle and foot. Patient was last seen 04/04/2016 at which time cultures were taken. Cultures came back positive for MRSA. Patient was contacted this a.m. by the office for an emergent office visit. Patient presents today for further treatment and evaluation   Objective/Physical Exam General: The patient is alert and oriented x3 in no acute distress.  Dermatology: Ulceration noted to the lateral aspects of the patient's left foot with a draining sinus. Ulceration measures [phone] cm (LxWxD).  To the noted ulceration there is no eschar there is no granulation tissue. There is an extensive amount of slough fibrin and necrotic tissue. There is a heavy amount of serous drainage noted. No malodor. Periwound integrity is erythematous.  Vascular: Palpable pedal pulses bilaterally. No edema or erythema noted. Capillary refill within normal limits.  Neurological: Epicritic and protective threshold grossly intact bilaterally.   Musculoskeletal Exam: Range of motion within normal limits to all pedal and ankle joints bilateral. Muscle strength 5/5 in all groups bilateral.   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 #2 today dressings were changed. We discussed that the patient requires surgical intervention. Authorization for surgery initiated today. Surgery will consist of incision and drainage left foot. Exostectomy of the cuboid and fifth metatarsal left foot. Placement of antibiotic beads left foot abscess. #3 the patient was told benefits as well as possible side effects the surgery. The patient  consented for surgical correction. No guarantees were expressed or implied. #4 the patient will remain on long term oral Bactrim DS 6 weeks, despite known history of C. Difficile. #5 return to clinic 1 week postop   Edrick Kins, DPM [facility]  Dr. Edrick Kins, [address]                                        [place], [place] [address]                Office [phone]  Fax [phone]

## 2016-04-10 ENCOUNTER — Encounter (HOSPITAL_COMMUNITY): Payer: Self-pay | Admitting: *Deleted

## 2016-04-10 ENCOUNTER — Telehealth: Payer: Self-pay | Admitting: *Deleted

## 2016-04-10 NOTE — Telephone Encounter (Signed)
"  I'm scheduled for surgery today but I don't know where I'm going, what time do I need to be there, when to stop eating."  You should hear something from them today.  "How long will it take for me to recuperate?  (We got disconnected.)    I called short stay and informed Jan that patient has not received a call about instructions.  She stated they were making calls as we speak.  I asked her when patients are supposed to arrive for surgery.  She stated they have to be there 2.5 hours prior to surgery time.  I called me back after I had made several attempts.  I informed her that she should hear from someone from [facility] soon.  I informed her it normally takes 4-6 weeks for recovery.  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  I asked her about the physical form and she stated that her doctor was supposed to fax it today.

## 2016-04-10 NOTE — Progress Notes (Signed)
[name] is a Type I diabetic and has an Insulin pump.  I asked patient when Dr [name] told her to stop eating, she said midnight and that she was going to stop Insulin pump at that time.  I told the patient that I would speak to the anesthesiologist and call her back.  I spoke with Dr Massage and he said that patient should be NPO at 0700 after a light breakfast.  I called [name] back and she said that she would eat some yogurt and she would decrease pump by 20 % at midnight.  I instructed patient to check CBG when she gets up in am and every 2 hours until she arrives.  Patient said that she does not have Glucose Gel or tablets where she is staying.  I instructed patient that if CBG is less than 7- to drink 1/2 cup of clear juice and recheck CBG in 15 minutes and to call Pre Op.  Patient vpiced understanding.  I asked patient to bring supplies for Insulin Pump.  I notified Bethena Roys, diabetic coordinator.

## 2016-04-11 ENCOUNTER — Ambulatory Visit (HOSPITAL_COMMUNITY): Payer: Medicare Other | Admitting: Certified Registered Nurse Anesthetist

## 2016-04-11 ENCOUNTER — Ambulatory Visit (HOSPITAL_COMMUNITY)
Admission: RE | Admit: 2016-04-11 | Discharge: 2016-04-11 | Disposition: A | Discharge status: Home / Self Care | Payer: Medicare Other | Source: Ambulatory Visit | Attending: Podiatry | Admitting: Podiatry

## 2016-04-11 ENCOUNTER — Encounter: Payer: Self-pay | Admitting: Podiatry

## 2016-04-11 ENCOUNTER — Encounter (HOSPITAL_COMMUNITY): Payer: Self-pay | Admitting: *Deleted

## 2016-04-11 ENCOUNTER — Encounter (HOSPITAL_COMMUNITY)
Admission: RE | Disposition: A | Discharge status: Home / Self Care | Payer: Self-pay | Source: Ambulatory Visit | Attending: Podiatry

## 2016-04-11 ENCOUNTER — Ambulatory Visit (HOSPITAL_COMMUNITY): Payer: Medicare Other

## 2016-04-11 DIAGNOSIS — Z22322 Carrier or suspected carrier of Methicillin resistant Staphylococcus aureus: Secondary | ICD-10-CM | POA: Diagnosis not present

## 2016-04-11 DIAGNOSIS — Z1629 Resistance to other single specified antibiotic: Secondary | ICD-10-CM | POA: Insufficient documentation

## 2016-04-11 DIAGNOSIS — I129 Hypertensive chronic kidney disease with stage 1 through stage 4 chronic kidney disease, or unspecified chronic kidney disease: Secondary | ICD-10-CM | POA: Insufficient documentation

## 2016-04-11 DIAGNOSIS — M869 Osteomyelitis, unspecified: Secondary | ICD-10-CM | POA: Diagnosis present

## 2016-04-11 DIAGNOSIS — Z1623 Resistance to quinolones and fluoroquinolones: Secondary | ICD-10-CM | POA: Insufficient documentation

## 2016-04-11 DIAGNOSIS — Z794 Long term (current) use of insulin: Secondary | ICD-10-CM | POA: Diagnosis not present

## 2016-04-11 DIAGNOSIS — M2022 Hallux rigidus, left foot: Secondary | ICD-10-CM | POA: Diagnosis not present

## 2016-04-11 DIAGNOSIS — M868X7 Other osteomyelitis, ankle and foot: Secondary | ICD-10-CM | POA: Diagnosis not present

## 2016-04-11 DIAGNOSIS — Z09 Encounter for follow-up examination after completed treatment for conditions other than malignant neoplasm: Secondary | ICD-10-CM

## 2016-04-11 DIAGNOSIS — M84872 Other disorders of continuity of bone, left ankle and foot: Secondary | ICD-10-CM | POA: Diagnosis not present

## 2016-04-11 DIAGNOSIS — Z1639 Resistance to other specified antimicrobial drug: Secondary | ICD-10-CM | POA: Diagnosis not present

## 2016-04-11 DIAGNOSIS — J449 Chronic obstructive pulmonary disease, unspecified: Secondary | ICD-10-CM | POA: Diagnosis not present

## 2016-04-11 DIAGNOSIS — E1022 Type 1 diabetes mellitus with diabetic chronic kidney disease: Secondary | ICD-10-CM | POA: Diagnosis not present

## 2016-04-11 DIAGNOSIS — N183 Chronic kidney disease, stage 3 (moderate): Secondary | ICD-10-CM | POA: Diagnosis not present

## 2016-04-11 DIAGNOSIS — I739 Peripheral vascular disease, unspecified: Secondary | ICD-10-CM | POA: Diagnosis not present

## 2016-04-11 DIAGNOSIS — Z1611 Resistance to penicillins: Secondary | ICD-10-CM | POA: Diagnosis not present

## 2016-04-11 DIAGNOSIS — B9562 Methicillin resistant Staphylococcus aureus infection as the cause of diseases classified elsewhere: Secondary | ICD-10-CM | POA: Insufficient documentation

## 2016-04-11 DIAGNOSIS — Z87891 Personal history of nicotine dependence: Secondary | ICD-10-CM | POA: Diagnosis not present

## 2016-04-11 DIAGNOSIS — M1990 Unspecified osteoarthritis, unspecified site: Secondary | ICD-10-CM | POA: Insufficient documentation

## 2016-04-11 DIAGNOSIS — L02612 Cutaneous abscess of left foot: Secondary | ICD-10-CM | POA: Insufficient documentation

## 2016-04-11 HISTORY — PX: INCISION AND DRAINAGE: SHX5863

## 2016-04-11 HISTORY — DX: Peripheral vascular disease, unspecified: I73.9

## 2016-04-11 HISTORY — PX: EXOSTECTECTOMY TOE: SHX6618

## 2016-04-11 HISTORY — DX: Cardiac murmur, unspecified: R01.1

## 2016-04-11 HISTORY — DX: Polyneuropathy, unspecified: G62.9

## 2016-04-11 HISTORY — DX: Personal history of other medical treatment: Z92.89

## 2016-04-11 LAB — CBC
HCT: 39.2 % (ref 36.0–46.0)
Hemoglobin: 12.7 g/dL (ref 12.0–15.0)
MCH: 27.7 pg (ref 26.0–34.0)
MCHC: 32.4 g/dL (ref 30.0–36.0)
MCV: 85.6 fL (ref 78.0–100.0)
Platelets: 446 10*3/uL — ABNORMAL HIGH (ref 150–400)
RBC: 4.58 MIL/uL (ref 3.87–5.11)
RDW: 13.5 % (ref 11.5–15.5)
WBC: 10.9 10*3/uL — ABNORMAL HIGH (ref 4.0–10.5)

## 2016-04-11 LAB — BASIC METABOLIC PANEL
Anion gap: 8 (ref 5–15)
BUN: 35 mg/dL — AB (ref 6–20)
CO2: 19 mmol/L — ABNORMAL LOW (ref 22–32)
CREATININE: 2.43 mg/dL — AB (ref 0.44–1.00)
Calcium: 8.2 mg/dL — ABNORMAL LOW (ref 8.9–10.3)
Chloride: 109 mmol/L (ref 101–111)
GFR calc Af Amer: 27 mL/min — ABNORMAL LOW (ref >60)
GFR, EST NON AFRICAN AMERICAN: 23 mL/min — AB (ref >60)
Glucose, Bld: 191 mg/dL — ABNORMAL HIGH (ref 65–99)
Potassium: 4.7 mmol/L (ref 3.5–5.1)
SODIUM: 136 mmol/L (ref 135–145)

## 2016-04-11 LAB — GLUCOSE, CAPILLARY
GLUCOSE-CAPILLARY: 192 mg/dL — AB (ref 65–99)
Glucose-Capillary: 182 mg/dL — ABNORMAL HIGH (ref 65–99)

## 2016-04-11 LAB — HCG, SERUM, QUALITATIVE: PREG SERUM: NEGATIVE

## 2016-04-11 SURGERY — INCISION AND DRAINAGE
Anesthesia: General | Laterality: Left

## 2016-04-11 MED ORDER — LIDOCAINE HCL 2 % IJ SOLN
INTRAMUSCULAR | Status: DC | PRN
  Administered 2016-04-11: 9 mL

## 2016-04-11 MED ORDER — EPHEDRINE 5 MG/ML INJ
INTRAVENOUS | Status: AC
  Filled 2016-04-11: qty 10

## 2016-04-11 MED ORDER — EPHEDRINE SULFATE 50 MG/ML IJ SOLN
INTRAMUSCULAR | Status: DC | PRN
  Administered 2016-04-11: 5 mg via INTRAVENOUS

## 2016-04-11 MED ORDER — FENTANYL CITRATE (PF) 100 MCG/2ML IJ SOLN
INTRAMUSCULAR | Status: AC
  Filled 2016-04-11: qty 4

## 2016-04-11 MED ORDER — HYDROMORPHONE HCL 1 MG/ML IJ SOLN: 0.2500 mg | INTRAMUSCULAR | Status: DC | PRN

## 2016-04-11 MED ORDER — LIDOCAINE HCL (CARDIAC) 20 MG/ML IV SOLN
INTRAVENOUS | Status: DC | PRN
  Administered 2016-04-11: 60 mg via INTRAVENOUS

## 2016-04-11 MED ORDER — PHENYLEPHRINE HCL 10 MG/ML IJ SOLN
INTRAMUSCULAR | Status: DC | PRN
  Administered 2016-04-11 (×2): 40 ug via INTRAVENOUS
  Administered 2016-04-11: 80 ug via INTRAVENOUS
  Administered 2016-04-11: 120 ug via INTRAVENOUS
  Administered 2016-04-11: 80 ug via INTRAVENOUS
  Administered 2016-04-11: 40 ug via INTRAVENOUS

## 2016-04-11 MED ORDER — VANCOMYCIN HCL 1000 MG IV SOLR
INTRAVENOUS | Status: DC | PRN
  Administered 2016-04-11: 1000 mg

## 2016-04-11 MED ORDER — CEFAZOLIN SODIUM-DEXTROSE 2-3 GM-% IV SOLR
INTRAVENOUS | Status: DC | PRN
  Administered 2016-04-11: 2 g via INTRAVENOUS

## 2016-04-11 MED ORDER — TOBRAMYCIN SULFATE 1.2 G IJ SOLR
INTRAMUSCULAR | Status: AC
  Filled 2016-04-11: qty 1.2

## 2016-04-11 MED ORDER — ONDANSETRON HCL 4 MG/2ML IJ SOLN
INTRAMUSCULAR | Status: DC | PRN
Start: 2016-04-11 — End: 2016-04-11
  Administered 2016-04-11: 4 mg via INTRAVENOUS

## 2016-04-11 MED ORDER — PROMETHAZINE HCL 25 MG/ML IJ SOLN: 6.2500 mg | INTRAMUSCULAR | Status: DC | PRN

## 2016-04-11 MED ORDER — CEFAZOLIN SODIUM-DEXTROSE 2-4 GM/100ML-% IV SOLN
INTRAVENOUS | Status: AC
  Filled 2016-04-11: qty 100

## 2016-04-11 MED ORDER — SODIUM CHLORIDE 0.9 % IV SOLN
INTRAVENOUS | Status: DC
  Administered 2016-04-11: 16:00:00 via INTRAVENOUS
  Administered 2016-04-11: 25 mL/h via INTRAVENOUS

## 2016-04-11 MED ORDER — ONDANSETRON HCL 4 MG/2ML IJ SOLN
INTRAMUSCULAR | Status: AC
  Filled 2016-04-11: qty 2

## 2016-04-11 MED ORDER — VANCOMYCIN HCL 1000 MG IV SOLR
INTRAVENOUS | Status: AC
  Filled 2016-04-11: qty 1000

## 2016-04-11 MED ORDER — PROPOFOL 10 MG/ML IV BOLUS
INTRAVENOUS | Status: DC | PRN
  Administered 2016-04-11: 150 mg via INTRAVENOUS

## 2016-04-11 MED ORDER — DIPHENHYDRAMINE HCL 50 MG/ML IJ SOLN
INTRAMUSCULAR | Status: DC | PRN
  Administered 2016-04-11: 12.5 mg via INTRAVENOUS

## 2016-04-11 MED ORDER — FENTANYL CITRATE (PF) 100 MCG/2ML IJ SOLN
INTRAMUSCULAR | Status: DC | PRN
  Administered 2016-04-11: 50 ug via INTRAVENOUS

## 2016-04-11 MED ORDER — SUCCINYLCHOLINE CHLORIDE 200 MG/10ML IV SOSY
PREFILLED_SYRINGE | INTRAVENOUS | Status: AC
  Filled 2016-04-11: qty 10

## 2016-04-11 MED ORDER — TOBRAMYCIN SULFATE 1.2 G IJ SOLR
INTRAMUSCULAR | Status: DC | PRN
  Administered 2016-04-11: 1.2 g

## 2016-04-11 MED ORDER — MIDAZOLAM HCL 5 MG/5ML IJ SOLN
INTRAMUSCULAR | Status: DC | PRN
  Administered 2016-04-11: 2 mg via INTRAVENOUS

## 2016-04-11 MED ORDER — PROPOFOL 10 MG/ML IV BOLUS
INTRAVENOUS | Status: AC
  Filled 2016-04-11: qty 20

## 2016-04-11 MED ORDER — LIDOCAINE HCL 2 % IJ SOLN
INTRAMUSCULAR | Status: AC
  Filled 2016-04-11: qty 20

## 2016-04-11 MED ORDER — BUPIVACAINE HCL (PF) 0.5 % IJ SOLN
INTRAMUSCULAR | Status: AC
  Filled 2016-04-11: qty 30

## 2016-04-11 MED ORDER — MIDAZOLAM HCL 2 MG/2ML IJ SOLN
INTRAMUSCULAR | Status: AC
  Filled 2016-04-11: qty 2

## 2016-04-11 MED ORDER — BUPIVACAINE HCL (PF) 0.5 % IJ SOLN
INTRAMUSCULAR | Status: DC | PRN
  Administered 2016-04-11: 9 mL

## 2016-04-11 SURGICAL SUPPLY — 37 items
BANDAGE ACE 4X5 VEL STRL LF (GAUZE/BANDAGES/DRESSINGS) ×1 IMPLANT
BLADE OSCIL/SAGITTAL W/10 ST (BLADE) ×1 IMPLANT
BLADE SURG ROTATE 9660 (MISCELLANEOUS) IMPLANT
BNDG GAUZE ELAST 4 BULKY (GAUZE/BANDAGES/DRESSINGS) ×1 IMPLANT
BONE CEMENT PALACOSE (Orthopedic Implant) ×2 IMPLANT
CANISTER SUCTION 2500CC (MISCELLANEOUS) ×2 IMPLANT
CEMENT BONE PALACOSE (Orthopedic Implant) IMPLANT
COVER SURGICAL LIGHT HANDLE (MISCELLANEOUS) ×2 IMPLANT
CUFF TOURNIQUET SINGLE 18IN (TOURNIQUET CUFF) ×1 IMPLANT
DRAPE HALF SHEET 40X57 (DRAPES) IMPLANT
DRAPE INCISE IOBAN 66X45 STRL (DRAPES) IMPLANT
DRAPE ORTHO SPLIT 77X108 STRL (DRAPES)
DRAPE SURG ORHT 6 SPLT 77X108 (DRAPES) IMPLANT
DRESSING ADAPTIC 1/2  N-ADH (PACKING) ×1 IMPLANT
DRSG ADAPTIC 3X8 NADH LF (GAUZE/BANDAGES/DRESSINGS) IMPLANT
DRSG PAD ABDOMINAL 8X10 ST (GAUZE/BANDAGES/DRESSINGS) ×1 IMPLANT
ELECT REM PT RETURN 9FT ADLT (ELECTROSURGICAL) ×2
ELECTRODE REM PT RTRN 9FT ADLT (ELECTROSURGICAL) ×1 IMPLANT
GAUZE SPONGE 4X4 12PLY STRL (GAUZE/BANDAGES/DRESSINGS) ×1 IMPLANT
GAUZE XEROFORM 1X8 LF (GAUZE/BANDAGES/DRESSINGS) ×1 IMPLANT
GOWN STRL REUS W/ TWL LRG LVL3 (GOWN DISPOSABLE) ×2 IMPLANT
GOWN STRL REUS W/TWL LRG LVL3 (GOWN DISPOSABLE) ×4
HANDPIECE INTERPULSE COAX TIP (DISPOSABLE)
KIT BASIN OR (CUSTOM PROCEDURE TRAY) ×2 IMPLANT
KIT ROOM TURNOVER OR (KITS) ×2 IMPLANT
NS IRRIG 1000ML POUR BTL (IV SOLUTION) ×2 IMPLANT
PACK GENERAL/GYN (CUSTOM PROCEDURE TRAY) ×2 IMPLANT
PAD ARMBOARD 7.5X6 YLW CONV (MISCELLANEOUS) ×4 IMPLANT
SET HNDPC FAN SPRY TIP SCT (DISPOSABLE) IMPLANT
STOCKINETTE TUBULAR SYNTH 4IN (CAST SUPPLIES) ×1 IMPLANT
SUT PDS II 0 TP-1 LOOPED 60 (SUTURE) ×1 IMPLANT
SUT SILK 4 0 PS 2 (SUTURE) IMPLANT
SUT VIC AB 5-0 PS2 18 (SUTURE) IMPLANT
SYSTEM BONE CEMENT MIXING (KITS) ×1 IMPLANT
TOWEL OR 17X24 6PK STRL BLUE (TOWEL DISPOSABLE) IMPLANT
TOWEL OR 17X26 10 PK STRL BLUE (TOWEL DISPOSABLE) ×2 IMPLANT
UNDERPAD 30X30 (UNDERPADS AND DIAPERS) IMPLANT

## 2016-04-11 NOTE — OR Nursing (Cosign Needed)
Discharge instructions were given to pt and pt friend. Print out of doctors instructions were given to pt and script was given to pt for pain. Pt denied any pain while in PACU and denied any need to help get dressed. Both pt and friend denied any questions regarding discharge instructions. Pt was wheeled out to car and assisted into car.

## 2016-04-11 NOTE — Anesthesia Procedure Notes (Signed)
Procedure Name: LMA Insertion Date/Time: 04/11/2016 3:58 PM Performed by: Carney Living Pre-anesthesia Checklist: Emergency Drugs available, Patient identified, Suction available, Patient being monitored and Timeout performed Patient Re-evaluated:Patient Re-evaluated prior to inductionOxygen Delivery Method: Circle system utilized Preoxygenation: Pre-oxygenation with 100% oxygen Intubation Type: IV induction LMA: LMA inserted LMA Size: 4.0 Number of attempts: 1 Placement Confirmation: positive ETCO2 and breath sounds checked- equal and bilateral Tube secured with: Tape

## 2016-04-11 NOTE — Transfer of Care (Signed)
Immediate Anesthesia Transfer of Care Note  Patient: ENIYA CANNADY  Procedure(s) Performed: Procedure(s): INCISION AND DRAINAGE A DEEP COMPLICATED WOUND OF LEFT FOOT (Left) EXOSTECTECTOMY CUBOID AND 5TH METATARSAL AND PLACEMENT OF ANTIBIOTIC BEADS (Left)  Patient Location: PACU  Anesthesia Type:General  Level of Consciousness: awake, alert , oriented and patient cooperative  Airway & Oxygen Therapy: Patient Spontanous Breathing and Patient connected to nasal cannula oxygen  Post-op Assessment: Report given to RN and Post -op Vital signs reviewed and stable  Post vital signs: Reviewed and stable  Last Vitals:  Vitals:   04/11/16 1325  BP: (!) 177/82  Pulse: 82  Resp: 20  Temp: 37.5 C    Last Pain:  Vitals:   04/11/16 1325  TempSrc: Oral         Complications: No apparent anesthesia complications

## 2016-04-11 NOTE — Anesthesia Postprocedure Evaluation (Signed)
Anesthesia Post Note  Patient: [name]  Procedure(s) Performed: Procedure(s) (LRB): INCISION AND DRAINAGE A DEEP COMPLICATED WOUND OF LEFT FOOT (Left) EXOSTECTECTOMY CUBOID AND 5TH METATARSAL AND PLACEMENT OF ANTIBIOTIC BEADS (Left)  Patient location during evaluation: PACU Anesthesia Type: General Level of consciousness: sedated Pain management: pain level controlled Vital Signs Assessment: post-procedure vital signs reviewed and stable Respiratory status: spontaneous breathing and respiratory function stable Cardiovascular status: stable Anesthetic complications: no       Last Vitals:  Vitals:   04/11/16 1734 04/11/16 1749  BP: (!) 143/76 (!) 153/79  Pulse: 91 92  Resp: 15 19  Temp: 36.7 C     Last Pain:  Vitals:   04/11/16 1325  TempSrc: Oral                 Raivyn Kabler DANIEL

## 2016-04-11 NOTE — Anesthesia Preprocedure Evaluation (Addendum)
Anesthesia Evaluation  Patient identified by MRN, date of birth, ID band Patient awake    Reviewed: Allergy & Precautions, NPO status , Patient's Chart, lab work & pertinent test results  History of Anesthesia Complications Negative for: history of anesthetic complications  Airway Mallampati: II  TM Distance: >3 FB Neck ROM: Full    Dental  (+) Chipped, Dental Advisory Given, Teeth Intact   Pulmonary COPD,  COPD inhaler, former smoker,    breath sounds clear to auscultation       Cardiovascular hypertension, Pt. on medications (-) angina+ Peripheral Vascular Disease   Rhythm:Regular Rate:Normal     Neuro/Psych negative neurological ROS     GI/Hepatic negative GI ROS, Neg liver ROS,   Endo/Other  diabetes, Type 1, Insulin Dependent  Renal/GU Renal InsufficiencyRenal disease (creat 4.94, K+ 4.0)     Musculoskeletal  (+) Arthritis , Osteoarthritis,    Abdominal   Peds  Hematology  (+) Blood dyscrasia (Hb 7.1), anemia ,   Anesthesia Other Findings Neuropathy in diabetes ([place])   Hypertension     Type 1 diabetes  .... BS 192 Neuropathy ( History of blood transfusion   CKD (chronic kidney disease) stage 3, GFR 30-59 ml/min  Reproductive/Obstetrics LMP 05/20/14, pt declines pregnancy testing, says no exposure/relations                           Anesthesia Physical  Anesthesia Plan  ASA: III  Anesthesia Plan: General   Post-op Pain Management:    Induction: Intravenous  Airway Management Planned: LMA  Additional Equipment:   Intra-op Plan:   Post-operative Plan: Extubation in OR  Informed Consent: I have reviewed the patients History and Physical, chart, labs and discussed the procedure including the risks, benefits and alternatives for the proposed anesthesia with the patient or authorized representative who has indicated his/her understanding and acceptance.   Dental advisory  given  Plan Discussed with: CRNA, Surgeon and Anesthesiologist  Anesthesia Plan Comments:        Anesthesia Quick Evaluation

## 2016-04-11 NOTE — Brief Op Note (Signed)
04/11/2016  5:37 PM  PATIENT:  [name]  46 y.o. female  PRE-OPERATIVE DIAGNOSIS:  OSTEOMYELITIS LEFT FOOT. ABSCESS LEFT FOOT.   POST-OPERATIVE DIAGNOSIS:  SAME PROCEDURE:  INCISION AND DRAINAGE LEFT FOOT. EXOSTECTOMY METATARSAL LEFT. EXOSTECTOMY CUBOID LEFT. APPLICATION OF ANTIBIOTIC BEADS LEFT.   SURGEON:  Surgeon(s) and Role:    * Edrick Kins, DPM - Primary  PHYSICIAN ASSISTANT:   ASSISTANTS: none   ANESTHESIA:   local and IV sedation  EBL:  Total I/O In: 550 [I.V.:550] Out: 20 [Blood:20]  BLOOD ADMINISTERED:none  DRAINS: none   LOCAL MEDICATIONS USED:  BUPIVICAINE  and XYLOCAINE   SPECIMEN:  Source of Specimen:  Deep wound culture. Bone biopsy.   DISPOSITION OF SPECIMEN:  PATHOLOGY  COUNTS:  YES  TOURNIQUET:   Total Tourniquet Time Documented: Calf (Left) - 58 minutes Total: Calf (Left) - 58 minutes   DICTATION: .Viviann Spare Dictation  PLAN OF CARE: Discharge to home after PACU  PATIENT DISPOSITION:  PACU - hemodynamically stable.   Delay start of Pharmacological VTE agent (>24hrs) due to surgical blood loss or risk of bleeding: not applicable

## 2016-04-11 NOTE — H&P (Signed)
anhpupdate

## 2016-04-12 ENCOUNTER — Ambulatory Visit: Payer: Medicare Other | Admitting: Endocrinology

## 2016-04-12 ENCOUNTER — Encounter (HOSPITAL_COMMUNITY): Payer: Self-pay | Admitting: Podiatry

## 2016-04-13 ENCOUNTER — Telehealth: Payer: Self-pay

## 2016-04-13 NOTE — Telephone Encounter (Signed)
LVM for patient to call with questions or concerns regarding post op status 

## 2016-04-14 LAB — AEROBIC CULTURE W GRAM STAIN (SUPERFICIAL SPECIMEN)

## 2016-04-14 LAB — AEROBIC CULTURE  (SUPERFICIAL SPECIMEN)

## 2016-04-15 NOTE — Op Note (Signed)
04/11/2016  5:37 PM  PATIENT:  [name]  46 y.o. female  PRE-OPERATIVE DIAGNOSIS:  OSTEOMYELITIS LEFT FOOT. ABSCESS LEFT FOOT.   POST-OPERATIVE DIAGNOSIS:  SAME  PROCEDURE:   #1 INCISION AND DRAINAGE LEFT FOOT.  #2 EXOSTECTOMY METATARSAL LEFT.  #3 EXOSTECTOMY CUBOID LEFT #4 APPLICATION OF ANTIBIOTIC BEADS LEFT.   SURGEON:  Surgeon(s) and Role:    * Edrick Kins, DPM - Primary  PHYSICIAN ASSISTANT: none  ASSISTANTS: none   ANESTHESIA:   local and IV sedation  EBL:  Total I/O In: 550 [I.V.:550] Out: 20 [Blood:20]  BLOOD ADMINISTERED:none  DRAINS: none   LOCAL MEDICATIONS USED:  BUPIVICAINE  and XYLOCAINE   SPECIMEN:   Source of Specimen:  Deep wound culture. Bone biopsy.   DISPOSITION OF SPECIMEN:  PATHOLOGY  COUNTS:  YES  TOURNIQUET:   Total Tourniquet Time Documented: Calf (Left) - 58 minutes Total: Calf (Left) - 58 minutes  JUSTIFICATION FOR PROCEDURE: This is a 46 year old female who presents as outpatient to the [facility] or surgical correction of osteomyelitis and abscess to the left foot. The patient has had multiple surgeries to the bilateral feet secondary to diabetes mellitus. Surgery on the left foot consisting of fifth ray amputation. Patient was last seen in the office on January 2018 at which time cultures were taken and were positive for MRSA. Osteomyelitis also evident on radiographic exam. Decision was made for surgical exostectomy and incision and drainage of an abscess which developed over the lateral aspect of the left foot. The patient was told benefits as well as possible side effects of surgery. The patient consented for surgical correction. The patient consent form was reviewed. All patient questions were answered. No guarantees were expressed or implied. The patient and the surgeon boson the patient consent form with the witness present and placed in the patient's chart.  PROCEDURE IN DETAIL: The patient  was brought to the operating room and placed in the operating table in supine position at which time an aseptic scrub and drape were performed about the patient's left lower extremity after anesthesia was induced as described above. The left ankle tourniquet was inflated to a pressure of 250 mg mercury and attention was directed to the lateral aspect of the patient's left ankle where procedure #1 commenced.  PROCEDURE #1: Incision and drainage left foot A 5 cm linear longitudinal skin incision was made and planned overlying the cuboid and fourth metatarsal base of the left foot. Incision was carried down the level subcutaneous tissue which which time blunt dissection was utilized to perforate all deep tissue abscesses. Also noted within the incision site were several nonabsorbable suture likely contributing to the infection and abscess. All suture material was removed and sent to pathology. All deep tissue abscesses were explored and all purulent drainage was expressed from within the incision site. Excisional debridement of all of the necrotic nonviable tissue down to healthy bleeding viable tissue was performed as well. The incision site was then copiously irrigated with normal saline and pulse lavage in preparation for procedure #2.  PROCEDURE #2: Exostectomy cuboid left foot Within the incision site previously mentioned in procedure #1, attention was directed to the cuboid bone of the left foot. Sharp tissue dissection was utilized to expose the underlying cuboid and all bony protuberances and bone spurs relating to the cuboid were shaved down using a #38 blade on a sagittal saw. The portion of bone was then resected and sent to pathology. The cuboid bone was then  visually inspected to ensure that there were no bony protuberances or sharp osseous structures that would contribute to an overlying ulcer or pressure point.  PROCEDURE #3: Exostectomy fourth metatarsal left foot Attention was then directed  within the incision site. She mentioned procedure #1 to the base of the fourth metatarsal of the left foot. Again all bony protuberances using sharp osseous structures relating to the fourth metatarsal were resected away using #38 blade on a sagittal saw. The portion of bone was removed in toto and placing sterile specimen container and sent to pathology. Visual inspection of the fourth metatarsal was performed to ensure that there were no bony protuberances or remaining exostoses relating to the fourth metatarsal that would contribute to an overlying ulceration or pressure point.  PROCEDURE #4: Placement of antibiotic beads left foot After procedure #3 the incision site was again copiously irrigated with normal saline and dried in preparation for placement of antibiotic beads to the left foot. Nonabsorbable polymethylmethacrylate beads were utilized in combination with 600 milligrams of vancomycin powder and 600 milligrams of tobramycin powder. The polymethylmethacrylate beads were placed on a nonabsorbable nylon suture. Approximately 5 antibiotic beads were placed within the incision site and primary closure was obtained over the beads. Superficial skin edges were reapproximated using 3-0 Prolene suture. The beads will stay in place for approximately 4 weeks all the patient is on oral antibiotic therapy as well. Antibiotic PMMA beads will be taken out in office.  Dry sterile compressive dressings were then applied to all previously mentioned incision sites about the patient's left lower extremity. The left ankle tourniquet which used for hemostasis was deflated. All normal neurovascular responses including pink color and warmth returned all the digits of the patient's left lower extremity. The patient was then transferred from the operating room to the recovery room having tolerated the procedure and anesthesia well. All vital signs are stable. After brief stay in the recovery room the patient was discharged  to her home with prescriptions for adequate analgesic care for at home use. The patient is currently on an antibiotic regimen consisting of Bactrim DS twice a day. Cultures and sensitivities were taken in the office setting and cultures were sensitive to Bactrim. Patient will be on Bactrim for approximately 6 weeks in combination with the antibiotic beads which were placed today.  Weightbearing as tolerated in the postoperative shoe. Keep dressings clean dry and intact. Follow-up in the office in 1 week.  PLAN OF CARE: Discharge to home after PACU  PATIENT DISPOSITION:  PACU - hemodynamically stable.   Delay start of Pharmacological VTE agent (>24hrs) due to surgical blood loss or risk of bleeding: not applicable   Edrick Kins, DPM [facility]  Dr. Edrick Kins, [address]                                        [place], [place] [address]                Office [phone]  Fax [phone]

## 2016-04-15 NOTE — Progress Notes (Signed)
Subjective:  Patient presents today for evaluation of an abscess to the lateral aspect of the left foot. Patient states that she's had multiple surgeries on both feet secondary to diabetes mellitus. On the right foot she has a transmetatarsal amputation. Left lower extremity is had multiple foot surgeries including a fifth ray resection. Patient appears very frustrated today regarding her previous surgeries. Patient is currently on doxycycline prescribed by her primary care physician.  Patient has previously been seen by Dr. Sharol Given and also by physicians in [facility]. She states that she was dismissed from the practice of [place]. She has also been seen by doctors in [place] in [place] for this. She is also been in contact with accompanying regards to her custom shoe insert of the left foot.    Objective/Physical Exam General: The patient is alert and oriented x3 in no acute distress.  Dermatology:  Draining Ulceration noted to the lateral aspect of the left foot with a deep wound abscess. Ulceration measures [phone] cm (LxWxD). Serous drainage noted. There is no eschar. There does appear to be an underlying abscess to the overlying draining sinus.  Vascular: Palpable pedal pulses bilaterally. No edema or erythema noted. Capillary refill within normal limits.  Neurological: Epicritic and protective threshold absent bilaterally.   Musculoskeletal Exam: Range of motion within normal limits to all pedal and ankle joints bilateral. Muscle strength 5/5 in all groups bilateral.   Radiographic Exam:  Evidence of fifth ray resection left foot. Osteolytic changes noted to the fourth metatarsal and cuboid left foot.    Assessment: #1 ulceration left lateral foot with underlying abscess 3 weeks secondary to diabetes mellitus #2 history of transmetatarsal amputation right foot #3 history of fifth ray amputation foot   Plan of Care:  #1 Patient was evaluated. #2 incision and drainage  of the abscess was performed in the office today. Upon incision and drainage there was underlying suture which was visualized and part of the nonabsorbable suture was removed.  #3 cultures taken today. #4 refill prescription for doxycycline 100 mg #5 home health care orders placed today #6 return to clinic in 2 weeks   Edrick Kins, DPM [facility]  Dr. Edrick Kins, [place]                                        [place],  [address]                Office [phone]  Fax [phone]

## 2016-04-17 LAB — AEROBIC/ANAEROBIC CULTURE W GRAM STAIN (SURGICAL/DEEP WOUND): Gram Stain: NONE SEEN

## 2016-04-17 LAB — ANAEROBIC CULTURE

## 2016-04-18 ENCOUNTER — Encounter: Payer: Medicare Other | Admitting: Podiatry

## 2016-04-20 ENCOUNTER — Ambulatory Visit (INDEPENDENT_AMBULATORY_CARE_PROVIDER_SITE_OTHER): Payer: Medicare Other

## 2016-04-20 ENCOUNTER — Ambulatory Visit (INDEPENDENT_AMBULATORY_CARE_PROVIDER_SITE_OTHER): Payer: Self-pay | Admitting: Podiatry

## 2016-04-20 ENCOUNTER — Encounter: Payer: Self-pay | Admitting: Podiatry

## 2016-04-20 VITALS — Temp 98.5°F

## 2016-04-20 DIAGNOSIS — Z22322 Carrier or suspected carrier of Methicillin resistant Staphylococcus aureus: Secondary | ICD-10-CM

## 2016-04-20 DIAGNOSIS — L02612 Cutaneous abscess of left foot: Secondary | ICD-10-CM

## 2016-04-20 DIAGNOSIS — Z9889 Other specified postprocedural states: Secondary | ICD-10-CM

## 2016-04-22 ENCOUNTER — Other Ambulatory Visit: Payer: Self-pay | Admitting: Endocrinology

## 2016-04-24 ENCOUNTER — Other Ambulatory Visit: Payer: Self-pay | Admitting: Endocrinology

## 2016-04-24 MED ORDER — HYDROCODONE-ACETAMINOPHEN 10-325 MG PO TABS: 1.0000 | ORAL_TABLET | Freq: Three times a day (TID) | ORAL | 0 refills | Status: DC | PRN

## 2016-04-24 NOTE — Telephone Encounter (Signed)
I haven't seen the patient since I did surgery on her since 04/11/16. I need her to follow up in the office asap.  Also, She has been on Bactrim DS since 04/09/16, I'm not sure if the N&V is from the bactrim since she has been on it already for 12 days and just now having a reaction. If she needs, she can discontinue Bactrim, and she needs to come in to the office for postop.  If she needs an Rx for pain, we can give her Vicodin10/325 #30.  Thanks, [name]

## 2016-04-25 ENCOUNTER — Ambulatory Visit (INDEPENDENT_AMBULATORY_CARE_PROVIDER_SITE_OTHER): Payer: Self-pay | Admitting: Podiatry

## 2016-04-25 DIAGNOSIS — Z9889 Other specified postprocedural states: Secondary | ICD-10-CM

## 2016-04-26 NOTE — Telephone Encounter (Signed)
If I recall (I feel like we talked about this on our last office visit) she is the one that requested Hydrocodone. Make sure she is aware that Norco is the same as Hydrocodone. If it isn't working, she can come in on her next office visit and we can get her something different.  Thanks, [name]

## 2016-04-26 NOTE — Progress Notes (Signed)
Subjective: [name] is a 46 y.o. is seen today in office s/p left foot I&D, exostectomy, wound debridement and placement of antibiotic beads preformed by [name]. They state their pain is controlled. She is still taking bactrim. She has been in the surgical boot.  Denies any systemic complaints such as fevers, chills, nausea, vomiting. No calf pain, chest pain, shortness of breath.   Objective: General: No acute distress, AAOx3  DP/PT pulses palpable 2/4, CRT < 3 sec to all digits.  Protective sensation intact. Motor function intact.  Right foot: Incision is well coapted without any evidence of dehiscence and only a small amount of serosanguineous drainage is expressed. There is no pus. There is no significant surrounding erythema and there is no ascending cellulitis. There is no fluctuance or crepitus. There is no malodor. No other areas of tenderness to bilateral lower extremities.  No other open lesions or pre-ulcerative lesions.  No pain with calf compression, swelling, warmth, erythema.   Assessment and Plan:  Status post left foot exostectomy, wound debridement, abx beads, doing well with no complications   -Treatment options discussed including all alternatives, risks, and complications -X-rays were obtained and reviewed with the patient. Antibiotic beads are present. There is no soft tissue emphysema. There is no evidence of acute osteomyelitis -Betadine with pain over the incision followed by Xeroform and a dry sterile dressing. Keep dressing clean, dry, intact -Continue with surgical boot. -Continue antibiotics as directed. -Ice/elevation -Pain medication as needed. -Monitor for any clinical signs or symptoms of infection and DVT/PE and directed to call the office immediately should any occur or go to the ER. -Follow-up in 1 week with [name] or sooner if any problems arise. In the meantime, encouraged to call the office with any questions, concerns, change in  symptoms.   Celesta Gentile, DPM

## 2016-04-30 ENCOUNTER — Ambulatory Visit: Payer: Medicare Other | Admitting: Podiatry

## 2016-05-01 ENCOUNTER — Telehealth: Payer: Self-pay | Admitting: Endocrinology

## 2016-05-01 ENCOUNTER — Other Ambulatory Visit: Payer: Self-pay

## 2016-05-01 ENCOUNTER — Other Ambulatory Visit: Payer: Medicare Other

## 2016-05-01 NOTE — Telephone Encounter (Signed)
Please call in rx for the diabetic supplies to [facility]

## 2016-05-02 ENCOUNTER — Encounter: Payer: Self-pay | Admitting: Podiatry

## 2016-05-02 ENCOUNTER — Other Ambulatory Visit (INDEPENDENT_AMBULATORY_CARE_PROVIDER_SITE_OTHER): Payer: Medicare Other

## 2016-05-02 ENCOUNTER — Ambulatory Visit (INDEPENDENT_AMBULATORY_CARE_PROVIDER_SITE_OTHER): Payer: Medicare Other

## 2016-05-02 ENCOUNTER — Other Ambulatory Visit: Payer: Self-pay

## 2016-05-02 ENCOUNTER — Ambulatory Visit (INDEPENDENT_AMBULATORY_CARE_PROVIDER_SITE_OTHER): Payer: Medicare Other | Admitting: Podiatry

## 2016-05-02 VITALS — Temp 96.9°F

## 2016-05-02 DIAGNOSIS — I70245 Atherosclerosis of native arteries of left leg with ulceration of other part of foot: Secondary | ICD-10-CM

## 2016-05-02 DIAGNOSIS — E1065 Type 1 diabetes mellitus with hyperglycemia: Secondary | ICD-10-CM

## 2016-05-02 DIAGNOSIS — L02612 Cutaneous abscess of left foot: Secondary | ICD-10-CM

## 2016-05-02 DIAGNOSIS — Z22322 Carrier or suspected carrier of Methicillin resistant Staphylococcus aureus: Secondary | ICD-10-CM

## 2016-05-02 LAB — COMPREHENSIVE METABOLIC PANEL
ALT: 60 U/L — AB (ref 0–35)
AST: 27 U/L (ref 0–37)
Albumin: 3.3 g/dL — ABNORMAL LOW (ref 3.5–5.2)
Alkaline Phosphatase: 198 U/L — ABNORMAL HIGH (ref 39–117)
BUN: 63 mg/dL — ABNORMAL HIGH (ref 6–23)
CO2: 16 meq/L — AB (ref 19–32)
Calcium: 7.5 mg/dL — ABNORMAL LOW (ref 8.4–10.5)
Chloride: 109 mEq/L (ref 96–112)
Creatinine, Ser: 2.69 mg/dL — ABNORMAL HIGH (ref 0.40–1.20)
GFR: 20.39 mL/min — AB (ref >60.00)
GLUCOSE: 399 mg/dL — AB (ref 70–99)
POTASSIUM: 6.4 meq/L — AB (ref 3.5–5.1)
Sodium: 134 mEq/L — ABNORMAL LOW (ref 135–145)
Total Bilirubin: 0.2 mg/dL (ref 0.2–1.2)
Total Protein: 6.4 g/dL (ref 6.0–8.3)

## 2016-05-02 LAB — LIPID PANEL
CHOL/HDL RATIO: 5
Cholesterol: 139 mg/dL (ref 0–200)
HDL: 26.3 mg/dL — AB (ref >39.00)
LDL Cholesterol: 78 mg/dL (ref 0–99)
NONHDL: 112.43
Triglycerides: 173 mg/dL — ABNORMAL HIGH (ref 0.0–149.0)
VLDL: 34.6 mg/dL (ref 0.0–40.0)

## 2016-05-02 LAB — HEMOGLOBIN A1C: HEMOGLOBIN A1C: 11 % — AB (ref 4.6–6.5)

## 2016-05-03 ENCOUNTER — Telehealth: Payer: Self-pay

## 2016-05-03 NOTE — Telephone Encounter (Signed)
Left a voice mail with instructions and requested a call back if she had any questions

## 2016-05-03 NOTE — Progress Notes (Signed)
Subjective:     Patient ID: [name], female   DOB: Feb 03, 1971, 46 y.o.   MRN: [id number]  HPI patient presents stating that her left foot seems to be doing okay after having IND and that the stitches are in place be to replace and will stay in for 1 more week   Review of Systems     Objective:   Physical Exam Neurovascular status unchanged with patient found to have incision on the dorsal lateral aspect left foot that is open but is localized with no proximal edema erythema drainage noted. There was no odor emitting from the wound and no type and drainage that could be cultured    Assessment:     Doing well I and D left lateral dorsal foot with no indications of active infection    Plan:     Cleaning the edges with sterile equipment flushed the area gently and then reapplied sterile dressing. Reappoint to see [name] next week

## 2016-05-03 NOTE — Progress Notes (Signed)
Subjective: Patient presents today status post incision and drainage with debridement of bone and placement of antibiotic beads left foot. Date of surgery 04/11/2016. Patient culture results were positive for MRSA. Patient is unable to take IV antibiotics due to increased risk of thrombophlebitis and blood clots.  Objective: Surgical incision site appears stable. Anabolic beads are intact. Minimal drainage noted.  Assessment: Status post incision and drainage with exostectomy and placement of antibiotic beads left foot. Date of surgery 04/11/2016  Plan of care: Continue Bactrim. The patient did have a complaint of some nausea and vomiting, however she is not sure whether or not nausea and vomiting is coming from the Bactrim. Recommend continuing Bactrim. Patient continues to have nausea and vomiting we will switch her to clindamycin. Prescription for hydrocodone Return to clinic in 1 week

## 2016-05-03 NOTE — Telephone Encounter (Signed)
Notify patient that she needs to contact nephrologist today because of the significantly high potassium, have already faxed the report to them

## 2016-05-03 NOTE — Telephone Encounter (Signed)
Called patient to inform her of the lab results and I left a voice mail for her to return my call

## 2016-05-03 NOTE — Telephone Encounter (Signed)
Critical lab (stat) results lab [phone]  [facility] called 05/02/16  @ 7:50  Potassium 6.4(3.5- 5.1)              -

## 2016-05-07 ENCOUNTER — Telehealth: Payer: Self-pay

## 2016-05-07 ENCOUNTER — Other Ambulatory Visit: Payer: Self-pay | Admitting: Endocrinology

## 2016-05-07 NOTE — Telephone Encounter (Signed)
She should absolutely not take lisinopril which causes high potassium, she should only get blood pressure medications from the nephrologist or myself.

## 2016-05-07 NOTE — Telephone Encounter (Signed)
Spoke to patient about what was noted and she stated an understanding

## 2016-05-07 NOTE — Telephone Encounter (Signed)
Patient stated she was taking lisinopril because her OBGYN told her to take it and she feels this is why her potassium was elevated- patient stated that Dr. Dwyane Dee took her off this medication and she is now confused on whether she should continue to take it or not please advise

## 2016-05-08 ENCOUNTER — Telehealth: Payer: Self-pay | Admitting: Endocrinology

## 2016-05-08 NOTE — Telephone Encounter (Signed)
[facility] called to follow up on some paperwork they faxed over for the patient.

## 2016-05-09 NOTE — Progress Notes (Signed)
DOS 01.10.2018 Incision and drainage left foot. Exostectomy left foot. Placement of antibiotic beads left foot. Any other indicated procedure.

## 2016-05-10 NOTE — Telephone Encounter (Signed)
This was given to [facility]

## 2016-05-11 ENCOUNTER — Other Ambulatory Visit: Payer: Self-pay | Admitting: Podiatry

## 2016-05-14 ENCOUNTER — Ambulatory Visit (INDEPENDENT_AMBULATORY_CARE_PROVIDER_SITE_OTHER): Payer: Self-pay | Admitting: Podiatry

## 2016-05-14 ENCOUNTER — Telehealth: Payer: Self-pay | Admitting: *Deleted

## 2016-05-14 ENCOUNTER — Other Ambulatory Visit: Payer: Self-pay | Admitting: Endocrinology

## 2016-05-14 ENCOUNTER — Telehealth: Payer: Self-pay | Admitting: Nutrition

## 2016-05-14 DIAGNOSIS — Z22322 Carrier or suspected carrier of Methicillin resistant Staphylococcus aureus: Secondary | ICD-10-CM

## 2016-05-14 DIAGNOSIS — L02612 Cutaneous abscess of left foot: Secondary | ICD-10-CM

## 2016-05-14 DIAGNOSIS — Z9889 Other specified postprocedural states: Secondary | ICD-10-CM

## 2016-05-14 MED ORDER — HYDROCODONE-ACETAMINOPHEN 10-325 MG PO TABS: 1.0000 | ORAL_TABLET | Freq: Three times a day (TID) | ORAL | 0 refills | Status: DC | PRN

## 2016-05-14 NOTE — Progress Notes (Signed)
   Subjective:  Patient presents today status post incision and drainage with placement of antibiotic beads to the left foot. Date of surgery 04/11/2016. Patient is a history of multiple amputations. Patient has a transmetatarsal amputation right foot. Left lower extremity has had multiple foot surgeries including fifth ray resection.    Objective/Physical Exam General: The patient is alert and oriented x3 in no acute distress.  Dermatology: Necrotic and fibrotic tissue is also noted overlying the surgical incision site which was left open for future removal of antibiotic beads which are going to be performed today. After removal of antibiotic beads, there is an ulceration approximately [phone] cm (LxWxD). Wound base appears to be granular and healthy with bleeding. Periwound integrity is intact. There is no malodor or purulent drainage noted.  Vascular: Diminished pedal pulses bilateral. No edema or erythema noted.  Neurological: Epicritic and protective threshold absent bilateral   Musculoskeletal Exam: Transmetatarsal amputation right foot. History of nausea fifth ray amputation left foot.   Assessment: #1 status post incision and drainage with exostectomy and placement of anabolic beads left foot. 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 #2 today the anabolic beads were removed. All sutures were removed. The beads were left intact for approximately 4 weeks. #3 patient is positive for osteomyelitis. IV antibiotic therapy is not an option as the patient has a history of blood clots. Continue Bactrim as tolerated based on cultures and sensitivities. Patient states that she does have nausea if she takes Bactrim during the day. Continue Bactrim at nighttime and doxycycline during the day. #4 today the patient was asking about hyperbaric oxygen therapy. The patient may  be an excellent candidate. Today we will refer the patient to the wound care center for possible hyperbaric oxygen therapy and treatment. #5 prescription for Vicodin 10/325 mg #6 return to clinic when necessary   Edrick Kins, DPM [facility]  Dr. Edrick Kins, [place] [address]                                        [place], [place] [address]                Office [phone]  Fax [phone]

## 2016-05-14 NOTE — Telephone Encounter (Signed)
Patient notified that we need fasting lab work for pump.  She was told that her FBS needs to be below 225.  She agreed to test fasting, and call us when she can come in

## 2016-05-14 NOTE — Telephone Encounter (Addendum)
-----   Message from Edrick Kins, DPM sent at 05/14/2016 11:25 AM EST ----- Regarding: Refer to [facility] Please refer patient to wound care center with Dr. Con Memos or Dellia Nims.  Possible Hyperbaric Oxygen Therapy  Dx : ulcer left lateral foot. Diabetes mellitus.   Thanks, [name]. Faxed required referral form, clinicals and demographics to [facility].

## 2016-05-15 ENCOUNTER — Encounter: Payer: Self-pay | Admitting: Endocrinology

## 2016-05-15 ENCOUNTER — Ambulatory Visit (INDEPENDENT_AMBULATORY_CARE_PROVIDER_SITE_OTHER): Payer: Medicare Other | Admitting: Endocrinology

## 2016-05-15 VITALS — BP 158/80 | HR 92 | Ht 67.0 in | Wt 131.0 lb

## 2016-05-15 DIAGNOSIS — E7800 Pure hypercholesterolemia, unspecified: Secondary | ICD-10-CM | POA: Diagnosis not present

## 2016-05-15 DIAGNOSIS — I70245 Atherosclerosis of native arteries of left leg with ulceration of other part of foot: Secondary | ICD-10-CM | POA: Diagnosis not present

## 2016-05-15 DIAGNOSIS — E875 Hyperkalemia: Secondary | ICD-10-CM

## 2016-05-15 DIAGNOSIS — I10 Essential (primary) hypertension: Secondary | ICD-10-CM | POA: Diagnosis not present

## 2016-05-15 DIAGNOSIS — E1065 Type 1 diabetes mellitus with hyperglycemia: Secondary | ICD-10-CM

## 2016-05-15 MED ORDER — AMLODIPINE BESYLATE 2.5 MG PO TABS: 2.5000 mg | ORAL_TABLET | Freq: Every day | ORAL | 3 refills | Status: DC

## 2016-05-15 NOTE — Telephone Encounter (Signed)
Alex from [facility] is calling on the status of paper work, she need last office note, labs, and a C peptide, and a fasting blood Glucose.  Fax # [phone]

## 2016-05-15 NOTE — Progress Notes (Signed)
Patient ID: [name], female   DOB: May 13, 1970, 46 y.o.   MRN: [id number]   Reason for Appointment: follow-up of diabetes  History of Present Illness   Diagnosis: Type 1 DIABETES MELITUS  Previous history: She has had long-standing poorly controlled diabetes and typically has poor compliance with self care measures despite periodic diabetes education and periodic followup  INSULIN PUMP regimen:   BASAL rate 0.4 at midnight, 0.5 at 12 noon and 0.6 at 5 PM  Carb coverage 1: 15 g and correction 1: 60 with target 120, active insulin 4 hours         Recent history:   Recent A1c was 11%    Current blood sugar patterns, difficulties with management and problems identified:  She had an increase in her basal rates done on her last visit  However her blood sugars are appearing to be higher  She is again not motivated to take care of her diabetes in anyway with checking her blood sugars, monitoring before eating, bolusing on time or watching her diet.  She is eating very high carbohydrate meals and drinking a lot of milk or juice without much protein  She has a couple of days without any boluses  She thinks she is trying to bolus mostly after eating when the blood sugar goes up  Also changing her infusion set every 5-7 days now instead of every 3-4 days  She says she lost her meter and is not checking sugars are often because of this  Blood sugars are usually markedly increased later at night probably after eating and drinking but are still relatively high when she first checks her blood sugar before noon  Mean values apply above for all meters except median for One Touch  PRE-MEAL Fasting Lunch Dinner Bedtime Overall  Glucose range: 268-400+    3:15-400+    Mean/median:     438    Meals: eating at variable times and also having snacks periodically.  Her daily carbohydrate intake is quite variable Physical activity: exercise: none         Dietician visit:  Most recent:?          Complications: are: Neuropathy, nephropathy, diabetic foot ulcer      Wt Readings from Last 3 Encounters:  05/15/16 131 lb (59.4 kg)  04/11/16 125 lb (56.7 kg)  03/11/16 135 lb (61.2 kg)   Lab Results  Component Value Date   HGBA1C 11.0 (H) 05/02/2016   HGBA1C 12.0 02/22/2016   HGBA1C 8.4 (H) 11/04/2015   Lab Results  Component Value Date   MICROALBUR 89.4 (H) 12/02/2015   LDLCALC 78 05/02/2016   CREATININE 2.69 (H) 05/02/2016    Other problems addressed:: See review of systems  Allergies as of 05/15/2016      Reactions   Cleocin [clindamycin Hcl] Diarrhea   Diarrhea    Lisinopril Other (See Comments)   Elevated potassium per pt report   Ciprofloxacin Itching      Medication List       Accurate as of 05/15/16 12:18 PM. Always use your most recent med list.          albuterol 108 (90 Base) MCG/ACT inhaler Commonly known as:  PROVENTIL HFA;VENTOLIN HFA Inhale 2 puffs into the lungs every 6 (six) hours as needed. Reported on 04/15/2015   amLODipine 2.5 MG tablet Commonly known as:  NORVASC Take 1 tablet (2.5 mg total) by mouth daily.   aspirin 325 MG tablet Take 325 mg  by mouth daily.   calcitRIOL 0.5 MCG capsule Commonly known as:  ROCALTROL Take 1 capsule by mouth daily.   CALCIUM 600+D 600-400 MG-UNIT tablet Generic drug:  Calcium Carbonate-Vitamin D Take 1 tablet by mouth daily.   doxycycline 100 MG tablet Commonly known as:  VIBRA-TABS TAKE 1 TABLET BY MOUTH TWICE A DAY   FLUoxetine 20 MG capsule Commonly known as:  PROZAC Take 1 capsule by mouth daily.   folic acid 578 MCG tablet Commonly known as:  FOLVITE Take 400 mcg by mouth daily.   furosemide 40 MG tablet Commonly known as:  LASIX Take 40 mg by mouth daily.   glucose blood test strip Commonly known as:  BAYER CONTOUR NEXT TEST Use as instructed to check blood sugar 4 times per day dx code E10.65   HUMALOG 100 UNIT/ML injection Generic drug:  insulin  lispro CALL MD IF <70, IF 151-200 =2UNITS,201-250 =4 UNITS,251-300 =6 UNITS, 301-350 =8 UNITS,351-400 =10 U   HYDROcodone-acetaminophen 10-325 MG tablet Commonly known as:  NORCO Take 1 tablet by mouth every 8 (eight) hours as needed.   norethindrone 5 MG tablet Commonly known as:  AYGESTIN Take 1 tablet by mouth See admin instructions. Take 1 tablet daily for 14 days and off 14 days.Marland Kitchen   oxyCODONE-acetaminophen 5-325 MG tablet Commonly known as:  PERCOCET/ROXICET Take 1 tablet by mouth every 6 (six) hours as needed for severe pain.   pravastatin 40 MG tablet Commonly known as:  PRAVACHOL TAKE 1 TABLET (40 MG TOTAL) BY MOUTH DAILY.   pyridoxine 100 MG tablet Commonly known as:  B-6 Take 100 mg by mouth daily. Reported on 04/15/2015   sulfamethoxazole-trimethoprim 800-160 MG tablet Commonly known as:  BACTRIM DS,SEPTRA DS Take 1 tablet by mouth 2 (two) times daily.   vitamin C 500 MG tablet Commonly known as:  ASCORBIC ACID Take 500 mg by mouth daily.   Vitamin D (Ergocalciferol) 50000 units Caps capsule Commonly known as:  DRISDOL TAKE ONE CAPSULE BY MOUTH ONCE A WEEK EVERY EVENING EVERY SUNDAY       Allergies:  Allergies  Allergen Reactions  . Cleocin [Clindamycin Hcl] Diarrhea    Diarrhea   . Lisinopril Other (See Comments)    Elevated potassium per pt report  . Ciprofloxacin Itching    Past Medical History:  Diagnosis Date  . Anemia   . C. difficile diarrhea 09/26/2014  . Cataracts, both eyes   . Cellulitis of right foot 06/02/2014  . CKD (chronic kidney disease) stage 3, GFR 30-59 ml/min    sees Dr Justin Mend- Stage IV  . Diabetic ulcer of right foot ([facility]) 06/02/2014  . DVT (deep venous thrombosis) ([facility]) 10/2014   one in each one in leg- PICC line   . H/O seasonal allergies   . Heart murmur    told once when she was pregnant- no furter mention- was in notes 11/2015- had Echo 8 /4/17  . History of blood transfusion   . Hypercholesteremia   . Hypertension   . Left  foot infection   . Neuropathy ([facility])    feet  . Neuropathy in diabetes ([facility])   . Peripheral vascular disease ([place] [facility])   . Staphylococcus aureus bacteremia 10/2014  . Type 1 diabetes ([facility])    onset age 25    Past Surgical History:  Procedure Laterality Date  . AMPUTATION TOE Left 07/24/2015   5th toe and Hallux amputation  . CESAREAN SECTION    . EXOSTECTECTOMY TOE Left 04/11/2016   Procedure:  EXOSTECTECTOMY CUBOID AND 5TH METATARSAL AND PLACEMENT OF ANTIBIOTIC BEADS;  Surgeon: Edrick Kins, DPM;  Location: [facility];  Service: Podiatry;  Laterality: Left;  . EYE SURGERY Bilateral    [place]  . FEMORAL-POPLITEAL BYPASS GRAFT Left 05/03/2015  . FOOT AMPUTATION THROUGH METATARSAL Right 2016  . hemrrhoidectomy    . I&D EXTREMITY Right 06/10/2014   Procedure: IRRIGATION AND DEBRIDEMENT Right Foot;  Surgeon: Newt Minion, MD;  Location: [facility];  Service: Orthopedics;  Laterality: Right;  . INCISION AND DRAINAGE Left 04/11/2016   Procedure: INCISION AND DRAINAGE A DEEP COMPLICATED WOUND OF LEFT FOOT;  Surgeon: Edrick Kins, DPM;  Location: [facility];  Service: Podiatry;  Laterality: Left;  . PERIPHERALLY INSERTED CENTRAL CATHETER INSERTION    . SKIN GRAFT Right 06/15/2014  . SKIN GRAFT Left 06/2015   foot   . SKIN SPLIT GRAFT Right 06/15/2014   Procedure: SPLIT THICKNESS SKIN GRAFT RIGHT FOOT;  Surgeon: Newt Minion, MD;  Location: [facility];  Service: Orthopedics;  Laterality: Right;  . TRANSMETATARSAL AMPUTATION Right   . TRANSMETATARSAL AMPUTATION Right 09/11/2014    Family History  Problem Relation Age of Onset  . Hyperlipidemia Mother   . Dementia Mother     Social History:  reports that she quit smoking about 8 years ago. She quit after 15.00 years of use. She has never used smokeless tobacco. She reports that she does not drink alcohol or use drugs.  Review of Systems:  Hypertension/CKD:    Followed by nephrologist, Does not have an upcoming appointment so Blood pressure is now  significantly higher compared to before She was told to take lisinopril by her gynecologist and her potassium was significantly high She has stopped taking this and was given instructions on low potassium diet She apparently had labs done for nephrologist yesterday, results not available    Lab Results  Component Value Date   CREATININE 2.69 (H) 05/02/2016   BUN 63 (H) 05/02/2016   NA 134 (L) 05/02/2016   K 6.4 (HH) 05/02/2016   CL 109 05/02/2016   CO2 16 (L) 05/02/2016     Lipids: Has been  prescribed pravastatin, She thinks she is taking this regularly  LDL controlled   Lab Results  Component Value Date   CHOL 139 05/02/2016   HDL 26.30 (L) 05/02/2016   LDLCALC 78 05/02/2016   LDLDIRECT 112.0 07/05/2014   TRIG 173.0 (H) 05/02/2016   CHOLHDL 5 05/02/2016       She has been treated by nephrologist for anemia and has required iron infusion    Lab Results  Component Value Date   WBC 10.9 (H) 04/11/2016   HGB 12.7 04/11/2016   HCT 39.2 04/11/2016   MCV 85.6 04/11/2016   PLT 446 (H) 04/11/2016    She has a Charcot foot And has had amputation of Left fifth toe which was infected She is now going to the wound care center for her left foot, infection appears to be improved  She has vitamin D deficiency and was given 50,000 units weekly   Examination:   BP (!) 158/80   Pulse 92   Ht 5\' 7"  (1.702 m)   Wt 131 lb (59.4 kg)   SpO2 98%   BMI 20.52 kg/m   Body mass index is 20.52 kg/m.   No lower leg edema present Repeat blood pressure was 144/80  ASSESSMENT/ PLAN:   Diabetes type 1 with poor control and multiple complications    Her diabetes  is persistently poorly controlled mostly from noncompliance with all measures of self-care A1c is 11%  See history of present illness for details of her blood sugar patterns, current management and problems identified  She is getting hypoglycemic partly because of inadequate insulin and also very high carbohydrate  intake Also not bolusing before eating or drinking high carbohydrate foods and drinks  Compliance with glucose monitoring, and changing infusion set is also inadequate  DIABETIC nephropathy with nephrotic syndrome: List edema recently  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   Renal dysfunction and hyperkalemia: Now followed by nephrologist  PLAN:    Basal rate 0.5 at midnight, 0.6 at 12 noon and 0.7 at 5 PM, the basal rates were programmed for the patient today and shown to her  Carbohydrate coverage 1: 12 and sensitivity 1:40  Follow-up with diabetes educator  She will call her pump company and tried to cut the freestyle [place] sensor.  Discussed how this would help her control and explained to her how to use this in general  She will also try to modify her diet with less carbohydrate and more protein  Start 2.5 mg amlodipine for  hypertension, follow-up with nephrologist also for her CKD and hyperkalemia   Counseling time on subjects discussed above is over 50% of today's 25 minute visit  There are no Patient Instructions on file for this visit.   [name] 05/15/2016, 12:18 PM

## 2016-05-17 ENCOUNTER — Other Ambulatory Visit: Payer: Self-pay | Admitting: Podiatry

## 2016-05-17 NOTE — Telephone Encounter (Signed)
Pt called again asking if she should change the bandage herself until she hears back about home health woundcare.

## 2016-05-18 ENCOUNTER — Other Ambulatory Visit (INDEPENDENT_AMBULATORY_CARE_PROVIDER_SITE_OTHER): Payer: Medicare Other

## 2016-05-18 DIAGNOSIS — E1065 Type 1 diabetes mellitus with hyperglycemia: Secondary | ICD-10-CM | POA: Diagnosis not present

## 2016-05-18 LAB — BASIC METABOLIC PANEL
BUN: 53 mg/dL — ABNORMAL HIGH (ref 6–23)
CALCIUM: 8 mg/dL — AB (ref 8.4–10.5)
CHLORIDE: 111 meq/L (ref 96–112)
CO2: 16 meq/L — AB (ref 19–32)
Creatinine, Ser: 2.42 mg/dL — ABNORMAL HIGH (ref 0.40–1.20)
GFR: 23.03 mL/min — ABNORMAL LOW (ref >60.00)
Glucose, Bld: 210 mg/dL — ABNORMAL HIGH (ref 70–99)
POTASSIUM: 4.7 meq/L (ref 3.5–5.1)
SODIUM: 133 meq/L — AB (ref 135–145)

## 2016-05-18 NOTE — Telephone Encounter (Signed)
Patient needs a referral to wound care center to manage care.  In the meantime, we can have home health care provide dressing changes.   ORDERS:  Home health dressing changes 3x/week x 6 weeks.  Dx : s/p incision and drainage left foot. Diabetes mellitus.   - cleanse with normal saline.  - dress with aquacel ag, 4x4 gauze, large kerlex, lightly wrapped ace.   Thanks, [name]

## 2016-05-18 NOTE — Telephone Encounter (Signed)
Thank you :)

## 2016-05-20 LAB — FRUCTOSAMINE: FRUCTOSAMINE: 363 umol/L — AB (ref 0–285)

## 2016-05-20 LAB — C-PEPTIDE: C-Peptide: <0.1 ng/mL — ABNORMAL LOW (ref 1.1–4.4)

## 2016-05-21 NOTE — Telephone Encounter (Signed)
C-peptide level is available but her blood sugar was 210.  In 2015 her C-peptide was done with a blood sugar of 54. [name]: Please find out this is acceptable

## 2016-05-21 NOTE — Telephone Encounter (Signed)
Can see Dr [name] for weekly dressing changes until set up with Wound Care center but needs referral ASAP

## 2016-05-22 NOTE — Telephone Encounter (Signed)
done

## 2016-05-23 ENCOUNTER — Emergency Department (HOSPITAL_BASED_OUTPATIENT_CLINIC_OR_DEPARTMENT_OTHER): Payer: Medicare Other

## 2016-05-23 ENCOUNTER — Emergency Department (HOSPITAL_BASED_OUTPATIENT_CLINIC_OR_DEPARTMENT_OTHER)
Admission: EM | Admit: 2016-05-23 | Discharge: 2016-05-23 | Disposition: A | Discharge status: Home / Self Care | Payer: Medicare Other | Attending: Emergency Medicine | Admitting: Emergency Medicine

## 2016-05-23 ENCOUNTER — Encounter (HOSPITAL_BASED_OUTPATIENT_CLINIC_OR_DEPARTMENT_OTHER): Payer: Self-pay | Admitting: *Deleted

## 2016-05-23 DIAGNOSIS — Z87891 Personal history of nicotine dependence: Secondary | ICD-10-CM | POA: Diagnosis not present

## 2016-05-23 DIAGNOSIS — W010XXA Fall on same level from slipping, tripping and stumbling without subsequent striking against object, initial encounter: Secondary | ICD-10-CM | POA: Insufficient documentation

## 2016-05-23 DIAGNOSIS — Y939 Activity, unspecified: Secondary | ICD-10-CM | POA: Insufficient documentation

## 2016-05-23 DIAGNOSIS — S2232XA Fracture of one rib, left side, initial encounter for closed fracture: Secondary | ICD-10-CM | POA: Diagnosis not present

## 2016-05-23 DIAGNOSIS — Y929 Unspecified place or not applicable: Secondary | ICD-10-CM | POA: Diagnosis not present

## 2016-05-23 DIAGNOSIS — E1022 Type 1 diabetes mellitus with diabetic chronic kidney disease: Secondary | ICD-10-CM | POA: Diagnosis not present

## 2016-05-23 DIAGNOSIS — Z79899 Other long term (current) drug therapy: Secondary | ICD-10-CM | POA: Insufficient documentation

## 2016-05-23 DIAGNOSIS — I129 Hypertensive chronic kidney disease with stage 1 through stage 4 chronic kidney disease, or unspecified chronic kidney disease: Secondary | ICD-10-CM | POA: Diagnosis not present

## 2016-05-23 DIAGNOSIS — Y999 Unspecified external cause status: Secondary | ICD-10-CM | POA: Diagnosis not present

## 2016-05-23 DIAGNOSIS — Z7982 Long term (current) use of aspirin: Secondary | ICD-10-CM | POA: Diagnosis not present

## 2016-05-23 DIAGNOSIS — N183 Chronic kidney disease, stage 3 (moderate): Secondary | ICD-10-CM | POA: Insufficient documentation

## 2016-05-23 DIAGNOSIS — R0781 Pleurodynia: Secondary | ICD-10-CM

## 2016-05-23 DIAGNOSIS — S299XXA Unspecified injury of thorax, initial encounter: Secondary | ICD-10-CM | POA: Diagnosis present

## 2016-05-23 MED ORDER — OXYCODONE-ACETAMINOPHEN 5-325 MG PO TABS
1.0000 | ORAL_TABLET | Freq: Once | ORAL | Status: DC
  Filled 2016-05-23: qty 1

## 2016-05-23 NOTE — ED Notes (Signed)
Patient transported to X-ray 

## 2016-05-23 NOTE — Discharge Instructions (Signed)
As discussed, please follow-up with your primary care provider for refill of pain medications.  Return to the emergency department if you experience shortness of breath, chest pain, or any other concerning symptoms.

## 2016-05-23 NOTE — Telephone Encounter (Signed)
No, no additional antibiotics at the moment. Does she have an appt with Wound Care yet? If she cannot get into wound care in the next week or so we need to get her back into the office for follow up. Thanks, [name]

## 2016-05-23 NOTE — ED Provider Notes (Signed)
[place] DEPT [facility] Provider Note   CSN: [id number] Arrival date & time: 05/23/16  1607  By signing my name below, I, Dora Sims, attest that this documentation has been prepared under the direction and in the presence of Avie Echevaria, PA-C. Electronically Signed: Dora Sims, Scribe. 05/23/2016. 6:26 PM.  History   Chief Complaint Chief Complaint  Patient presents with  . Chest Pain    rib pain     The history is provided by the patient. No language interpreter was used.    HPI Comments: [name] is a 46 y.o. female with PMHx including HTN, hypercholesteremia, DVT, diabetic neuropathy on insulin, and CKD who presents to the Emergency Department complaining of constant, severe, left rib pain for a couple of days. Pt reports she has experienced similar pain intermittently for a few months and states she has "slipped" several ribs in the past. Pt has a h/o multiple rib fractures as well. She states her pain is worse with deep inhalation, general movement, and applied pressure to her left rib. Pt was seen by her chiropractor earlier today and was advised to come to the ED for x-ray. No medications or treatments tried since onset. She has tried pain medications and muscle relaxants in the past with minimal improvement of her rib pain. She notes she takes all of her regular medications as prescribed. Pt denies chest wall pain, SOB, cough, fever, chills, or any other associated symptoms.  Past Medical History:  Diagnosis Date  . Anemia   . C. difficile diarrhea 09/26/2014  . Cataracts, both eyes   . Cellulitis of right foot 06/02/2014  . CKD (chronic kidney disease) stage 3, GFR 30-59 ml/min    sees Dr Justin Mend- Stage IV  . Diabetic ulcer of right foot ([place]) 06/02/2014  . DVT (deep venous thrombosis) ([place]) 10/2014   one in each one in leg- PICC line   . H/O seasonal allergies   . Heart murmur    told once when she was pregnant- no furter mention- was in notes 11/2015- had  Echo 8 /4/17  . History of blood transfusion   . Hypercholesteremia   . Hypertension   . Left foot infection   . Neuropathy (HCC)    feet  . Neuropathy in diabetes ([place])   . Peripheral vascular disease ([place])   . Staphylococcus aureus bacteremia 10/2014  . Type 1 diabetes [facility])    onset age 42    Patient Active Problem List   Diagnosis Date Noted  . Boil of scalp 11/04/2015  . Boil of left cheek 11/04/2015  . Boil of lower abdomen 11/04/2015  . MRSA cellulitis 11/04/2015  . Hyponatremia 11/03/2015  . Bacterial folliculitis 15/17/6160  . Heart murmur 11/03/2015  . Protein-calorie malnutrition, moderate ([place]) 10/18/2014  . Left leg DVT ([place]) 10/18/2014  . DVT of upper extremity (deep vein thrombosis) ([place])   . Staphylococcus aureus bacteremia   . Cellulitis of left foot 10/13/2014  . Type 1 diabetes mellitus with hyperglycemia ([place]) 10/13/2014  . Chronic anemia 10/13/2014  . Hyperkalemia 10/06/2014  . Bilateral lower extremity edema 10/04/2014  . Status post transmetatarsal amputation of right foot ([place]) 09/26/2014  . Diabetic ulcer of left foot associated with type 1 diabetes mellitus ([place]) 09/26/2014  . Acute kidney injury superimposed on chronic kidney disease ([place]) 06/13/2014  . DM type 1 causing renal disease ([place]) 06/13/2013  . Osteomyelitis ([place]) 06/12/2013  . CKD (chronic kidney disease), stage III 06/12/2013  . UTI (urinary tract infection)  03/25/2013  . CAP (community acquired pneumonia) 03/25/2013  . Acute blood loss anemia 03/25/2013  . Acidosis 03/25/2013  . Leukocytosis, unspecified 03/25/2013  . Iron deficiency anemia 05/09/2011  . Charcot foot due to diabetes mellitus ([place]) 05/09/2011  . Foot ulcer due to secondary DM ([place]) 05/09/2011  . Type 1 diabetes mellitus with neurological manifestations, uncontrolled ([place])   . Hypertension   . Hypercholesteremia     Past Surgical History:  Procedure Laterality Date  . AMPUTATION TOE Left 07/24/2015   5th toe  and Hallux amputation  . CESAREAN SECTION    . EXOSTECTECTOMY TOE Left 04/11/2016   Procedure: EXOSTECTECTOMY CUBOID AND 5TH METATARSAL AND PLACEMENT OF ANTIBIOTIC BEADS;  Surgeon: Edrick Kins, DPM;  [facility];  Service: Podiatry;  Laterality: Left;  . EYE SURGERY Bilateral    [place]  . FEMORAL-POPLITEAL BYPASS GRAFT Left 05/03/2015  . FOOT AMPUTATION THROUGH METATARSAL Right 2016  . hemrrhoidectomy    . I&D EXTREMITY Right 06/10/2014   Procedure: IRRIGATION AND DEBRIDEMENT Right Foot;  Surgeon: Newt Minion, MD;  Location: [name];  Service: Orthopedics;  Laterality: Right;  . INCISION AND DRAINAGE Left 04/11/2016   Procedure: INCISION AND DRAINAGE A DEEP COMPLICATED WOUND OF LEFT FOOT;  Surgeon: Edrick Kins, DPM;  Location: [place];  Service: Podiatry;  Laterality: Left;  . PERIPHERALLY INSERTED CENTRAL CATHETER INSERTION    . SKIN GRAFT Right 06/15/2014  . SKIN GRAFT Left 06/2015   foot   . SKIN SPLIT GRAFT Right 06/15/2014   Procedure: SPLIT THICKNESS SKIN GRAFT RIGHT FOOT;  Surgeon: Newt Minion, MD;  Location: [facility];  Service: Orthopedics;  Laterality: Right;  . TRANSMETATARSAL AMPUTATION Right   . TRANSMETATARSAL AMPUTATION Right 09/11/2014    OB History    Gravida Para Term Preterm AB Living   1             SAB TAB Ectopic Multiple Live Births                   Home Medications    Prior to Admission medications   Medication Sig Start Date End Date Taking? Authorizing Provider  albuterol (PROVENTIL HFA;VENTOLIN HFA) 108 (90 BASE) MCG/ACT inhaler Inhale 2 puffs into the lungs every 6 (six) hours as needed. Reported on 04/15/2015    Historical Provider, MD  amLODipine (NORVASC) 2.5 MG tablet Take 1 tablet (2.5 mg total) by mouth daily. 05/15/16   Elayne Snare, MD  aspirin 325 MG tablet Take 325 mg by mouth daily.    Historical Provider, MD  calcitRIOL (ROCALTROL) 0.5 MCG capsule Take 1 capsule by mouth daily. 03/13/16   Historical Provider, MD  Calcium Carbonate-Vitamin D  (CALCIUM 600+D) 600-400 MG-UNIT tablet Take 1 tablet by mouth daily.    Historical Provider, MD  doxycycline (VIBRA-TABS) 100 MG tablet TAKE 1 TABLET BY MOUTH TWICE A DAY 05/11/16   Edrick Kins, DPM  FLUoxetine (PROZAC) 20 MG capsule Take 1 capsule by mouth daily. 03/23/16   Historical Provider, MD  folic acid (FOLVITE) 638 MCG tablet Take 400 mcg by mouth daily.    Historical Provider, MD  furosemide (LASIX) 40 MG tablet Take 40 mg by mouth daily.     Historical Provider, MD  glucose blood (BAYER CONTOUR NEXT TEST) test strip Use as instructed to check blood sugar 4 times per day dx code E10.65 12/07/15   Elayne Snare, MD  HUMALOG 100 UNIT/ML injection CALL MD IF <70, IF 151-200 =2UNITS,201-250 =4  UNITS,251-300 =6 UNITS, 301-350 =8 UNITS,351-400 =10 U 04/24/16   Elayne Snare, MD  HYDROcodone-acetaminophen (NORCO) 10-325 MG tablet Take 1 tablet by mouth every 8 (eight) hours as needed. 05/14/16   Edrick Kins, DPM  norethindrone (AYGESTIN) 5 MG tablet Take 1 tablet by mouth See admin instructions. Take 1 tablet daily for 14 days and off 14 days.Marland Kitchen    Historical Provider, MD  oxyCODONE-acetaminophen (PERCOCET/ROXICET) 5-325 MG tablet Take 1 tablet by mouth every 6 (six) hours as needed for severe pain. 04/11/16   Edrick Kins, DPM  pravastatin (PRAVACHOL) 40 MG tablet TAKE 1 TABLET (40 MG TOTAL) BY MOUTH DAILY. 05/07/16   Elayne Snare, MD  pyridoxine (B-6) 100 MG tablet Take 100 mg by mouth daily. Reported on 04/15/2015    Historical Provider, MD  sulfamethoxazole-trimethoprim (BACTRIM DS,SEPTRA DS) 800-160 MG tablet Take 1 tablet by mouth 2 (two) times daily. 04/09/16   Edrick Kins, DPM  vitamin C (ASCORBIC ACID) 500 MG tablet Take 500 mg by mouth daily.    Historical Provider, MD  Vitamin D, Ergocalciferol, (DRISDOL) 50000 units CAPS capsule TAKE ONE CAPSULE BY MOUTH ONCE A WEEK EVERY EVENING EVERY SUNDAY 04/23/16   Elayne Snare, MD    Family History Family History  Problem Relation Age of Onset  .  Hyperlipidemia Mother   . Dementia Mother     Social History Social History  Substance Use Topics  . Smoking status: Former Smoker    Years: 15.00    Quit date: 05/16/2008  . Smokeless tobacco: Never Used  . Alcohol use No     Comment: Occasional     Allergies   Cleocin [clindamycin hcl]; Lisinopril; and Ciprofloxacin   Review of Systems Review of Systems  Constitutional: Negative for chills and fever.  HENT: Negative for congestion.   Respiratory: Negative for cough, chest tightness, shortness of breath and wheezing.   Cardiovascular: Positive for chest pain (rib pain).  Gastrointestinal: Negative for nausea and vomiting.  Musculoskeletal: Negative for neck stiffness.  Skin: Negative for color change and pallor.    Physical Exam Updated Vital Signs BP 154/86 (BP Location: Right Arm)   Pulse 79   Temp 98.2 F (36.8 C) (Oral)   Resp 16   Ht 5\' 7"  (1.702 m)   Wt 59 kg   LMP 05/16/2016   SpO2 100%   BMI 20.36 kg/m   Physical Exam  Constitutional: She is oriented to person, place, and time. She appears well-developed and well-nourished. No distress.  Patient is afebrile, non-toxic appearing, sitting comfortably in chair in no acute distress.  HENT:  Head: Normocephalic and atraumatic.  Eyes: Conjunctivae and EOM are normal.  Neck: Normal range of motion. Neck supple. No tracheal deviation present.  Cardiovascular: Normal rate, regular rhythm and normal heart sounds.   Pulmonary/Chest: Effort normal and breath sounds normal. No respiratory distress. She has no wheezes. She has no rales. She exhibits no tenderness.  Musculoskeletal: Normal range of motion. She exhibits tenderness.  Tenderness to the left 10th rib posteriorly.  Neurological: She is alert and oriented to person, place, and time.  Skin: Skin is warm and dry. No pallor.  Psychiatric: She has a normal mood and affect. Her behavior is normal.  Nursing note and vitals reviewed.    ED Treatments /  Results  Labs (all labs ordered are listed, but only abnormal results are displayed) Labs Reviewed - No data to display  EKG  EKG Interpretation None  Radiology Dg Ribs Unilateral W/chest Left  Result Date: 05/23/2016 CLINICAL DATA:  Left lower rib pain for 1 month after moving heavy furniture. Initial encounter. EXAM: LEFT RIBS AND CHEST - 3+ VIEW COMPARISON:  02/24/2016 FINDINGS: Mildly displaced fracture of the left posterior ninth rib is seen. No other definite rib fractures identified. There is no evidence of pneumothorax or pleural effusion. Both lungs are clear. Heart size and mediastinal contours are within normal limits. IMPRESSION: Left posterior ninth rib fracture. No evidence of pneumothorax or hemothorax. Electronically Signed   By: Earle Gell M.D.   On: 05/23/2016 18:34    Procedures Procedures (including critical care time)  DIAGNOSTIC STUDIES: Oxygen Saturation is 100% on RA, normal by my interpretation.    COORDINATION OF CARE: 6:38 PM Discussed treatment plan with pt at bedside and pt agreed to plan.  Medications Ordered in ED Medications  oxyCODONE-acetaminophen (PERCOCET/ROXICET) 5-325 MG per tablet 1 tablet (1 tablet Oral Not Given 05/23/16 1909)     Initial Impression / Assessment and Plan / ED Course  I have reviewed the triage vital signs and the nursing notes.  Pertinent labs & imaging results that were available during my care of the patient were reviewed by me and considered in my medical decision making (see chart for details).     Patient presents with posterior rib tenderness on the left side. X-ray shows ninth rib fracture on the left. Lungs are clear and equal bilaterally with no abnormal sounds. No hemothorax or pneumothorax. No evidence of acute cardiopulmonary process.  She was prescribed hydrocodone 9 days ago. Advised patient to contact her PCP for refills of pain medications. She explained that she has given her medication to a dog  shelter as she didn't think it was going to work for her. Provided education on the importance of maintaining deep breaths to avoid atelectasis.   Discharge home with close PCP follow-up for refill of pain medications.  Patient was discussed with Dr. Rex Kras who  agrees with assessment and plan.  Discussed strict return precautions. Patient was advised to return to the emergency department if experiencing any new or worsening symptoms. Patient clearly understood instructions and agreed with discharge plan.  Final Clinical Impressions(s) / ED Diagnoses   Final diagnoses:  Closed fracture of one rib of left side, initial encounter    New Prescriptions Discharge Medication List as of 05/23/2016  7:22 PM     I personally performed the services described in this documentation, which was scribed in my presence. The recorded information has been reviewed and is accurate.    Emeline General, PA-C 05/23/16 2014    Sharlett Iles, MD 05/26/16 [phone]

## 2016-05-23 NOTE — ED Triage Notes (Signed)
Pt c/o left rib pain x 1 day , denies injury

## 2016-05-26 ENCOUNTER — Other Ambulatory Visit: Payer: Self-pay | Admitting: Endocrinology

## 2016-05-28 ENCOUNTER — Encounter: Payer: Self-pay | Admitting: Podiatry

## 2016-05-28 ENCOUNTER — Telehealth: Payer: Self-pay | Admitting: Endocrinology

## 2016-05-28 ENCOUNTER — Ambulatory Visit (INDEPENDENT_AMBULATORY_CARE_PROVIDER_SITE_OTHER): Payer: Medicare Other | Admitting: Podiatry

## 2016-05-28 VITALS — BP 126/72 | HR 98

## 2016-05-28 DIAGNOSIS — I70245 Atherosclerosis of native arteries of left leg with ulceration of other part of foot: Secondary | ICD-10-CM

## 2016-05-28 DIAGNOSIS — Z22322 Carrier or suspected carrier of Methicillin resistant Staphylococcus aureus: Secondary | ICD-10-CM

## 2016-05-28 DIAGNOSIS — E08621 Diabetes mellitus due to underlying condition with foot ulcer: Secondary | ICD-10-CM | POA: Diagnosis not present

## 2016-05-28 DIAGNOSIS — Z9889 Other specified postprocedural states: Secondary | ICD-10-CM

## 2016-05-28 DIAGNOSIS — L97515 Non-pressure chronic ulcer of other part of right foot with muscle involvement without evidence of necrosis: Secondary | ICD-10-CM | POA: Diagnosis not present

## 2016-05-28 MED ORDER — HYDROCODONE-ACETAMINOPHEN 10-325 MG PO TABS: 1.0000 | ORAL_TABLET | Freq: Three times a day (TID) | ORAL | 0 refills | Status: DC | PRN

## 2016-05-28 NOTE — Telephone Encounter (Signed)
Following up on paperwork that was refaxed on 2/21 to be corrected.  Also has questions about office notes and lab results.  Please call back at your earliest convenience.  [phone]

## 2016-05-30 NOTE — Telephone Encounter (Signed)
[name] calling on the status of paper work faxed on 21st need correction made.  [phone]

## 2016-06-03 NOTE — Progress Notes (Signed)
   Subjective:  Patient presents today status post incision and drainage with placement of antibiotic beads to the left foot. Date of surgery 04/11/2016. Patient is a history of multiple amputations. Patient has a transmetatarsal amputation right foot. Left lower extremity has had multiple foot surgeries including fifth ray resection.    Objective/Physical Exam General: The patient is alert and oriented x3 in no acute distress.  Dermatology: Necrotic and fibrotic tissue is also noted overlying the surgical incision site which was left open for future removal of antibiotic beads which are going to be performed today. After removal of antibiotic beads, there is an ulceration approximately [phone] cm (LxWxD). Wound base appears to be granular and healthy with bleeding. Periwound integrity is intact. There is no malodor or purulent drainage noted. There does appear to be deep tissue possible muscle-tendon ligament with exposed bone.  Vascular: Diminished pedal pulses bilateral. No edema or erythema noted.  Neurological: Epicritic and protective threshold absent bilateral   Musculoskeletal Exam: Transmetatarsal amputation right foot. History of nausea fifth ray amputation left foot.   Assessment: #1 status post incision and drainage with exostectomy and placement of antibiotic beads left foot. 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 #2 medically necessary excisional debridement including muscle and deep fascial tissue was performed using a tissue nipper. Excisional debridement of all necrotic nonviable tissue down to healthy bleeding viable tissue was performed with post-debridement measurements same as pre-. #3 the wound was cleansed with normal saline and dry sterile dressings applied. #4 continue home health dressing changes 2 times per week #5 patient has an  appointment scheduled with the wound care center at [facility] on 06/05/2016 of the care of Dr. Con Memos. Patient would be a great candidate for hyperbarics due to recurrent osteomyelitis. #6 continue antibiotic treatment #7 return to clinic when necessary  Edrick Kins, DPM [facility]  Dr. Edrick Kins, [address]                                        [place], [place] [address]                Office [phone]  Fax [phone]

## 2016-06-05 ENCOUNTER — Telehealth: Payer: Self-pay | Admitting: Endocrinology

## 2016-06-05 ENCOUNTER — Encounter (HOSPITAL_BASED_OUTPATIENT_CLINIC_OR_DEPARTMENT_OTHER): Payer: Medicare Other | Attending: Surgery

## 2016-06-05 DIAGNOSIS — L97522 Non-pressure chronic ulcer of other part of left foot with fat layer exposed: Secondary | ICD-10-CM | POA: Diagnosis not present

## 2016-06-05 DIAGNOSIS — Z89422 Acquired absence of other left toe(s): Secondary | ICD-10-CM | POA: Diagnosis not present

## 2016-06-05 DIAGNOSIS — G4730 Sleep apnea, unspecified: Secondary | ICD-10-CM | POA: Diagnosis not present

## 2016-06-05 DIAGNOSIS — Z794 Long term (current) use of insulin: Secondary | ICD-10-CM | POA: Insufficient documentation

## 2016-06-05 DIAGNOSIS — Z89431 Acquired absence of right foot: Secondary | ICD-10-CM | POA: Diagnosis not present

## 2016-06-05 DIAGNOSIS — Z7982 Long term (current) use of aspirin: Secondary | ICD-10-CM | POA: Insufficient documentation

## 2016-06-05 DIAGNOSIS — I10 Essential (primary) hypertension: Secondary | ICD-10-CM | POA: Insufficient documentation

## 2016-06-05 DIAGNOSIS — Z79891 Long term (current) use of opiate analgesic: Secondary | ICD-10-CM | POA: Diagnosis not present

## 2016-06-05 DIAGNOSIS — E1065 Type 1 diabetes mellitus with hyperglycemia: Secondary | ICD-10-CM | POA: Diagnosis not present

## 2016-06-05 DIAGNOSIS — E10621 Type 1 diabetes mellitus with foot ulcer: Secondary | ICD-10-CM | POA: Diagnosis present

## 2016-06-05 DIAGNOSIS — M86372 Chronic multifocal osteomyelitis, left ankle and foot: Secondary | ICD-10-CM | POA: Insufficient documentation

## 2016-06-05 DIAGNOSIS — E1051 Type 1 diabetes mellitus with diabetic peripheral angiopathy without gangrene: Secondary | ICD-10-CM | POA: Insufficient documentation

## 2016-06-05 DIAGNOSIS — Z79899 Other long term (current) drug therapy: Secondary | ICD-10-CM | POA: Insufficient documentation

## 2016-06-05 DIAGNOSIS — Z86718 Personal history of other venous thrombosis and embolism: Secondary | ICD-10-CM | POA: Insufficient documentation

## 2016-06-05 DIAGNOSIS — E1040 Type 1 diabetes mellitus with diabetic neuropathy, unspecified: Secondary | ICD-10-CM | POA: Diagnosis not present

## 2016-06-05 DIAGNOSIS — L97512 Non-pressure chronic ulcer of other part of right foot with fat layer exposed: Secondary | ICD-10-CM | POA: Diagnosis not present

## 2016-06-05 DIAGNOSIS — Z95820 Peripheral vascular angioplasty status with implants and grafts: Secondary | ICD-10-CM | POA: Insufficient documentation

## 2016-06-05 NOTE — Telephone Encounter (Signed)
[name] is needing verification that the patient was fasting at the time of labs and she had some other things to verify

## 2016-06-06 ENCOUNTER — Other Ambulatory Visit: Payer: Self-pay

## 2016-06-06 NOTE — Telephone Encounter (Signed)
Called [facility], submitted lab orders again with MD signature. After speaking with [name], patient does not test 4 times daily, asked what next steps would be. Faxed today.

## 2016-06-08 ENCOUNTER — Emergency Department (HOSPITAL_COMMUNITY)
Admission: EM | Admit: 2016-06-08 | Discharge: 2016-06-08 | Disposition: A | Discharge status: Home / Self Care | Payer: Medicare Other | Attending: Emergency Medicine | Admitting: Emergency Medicine

## 2016-06-08 DIAGNOSIS — I129 Hypertensive chronic kidney disease with stage 1 through stage 4 chronic kidney disease, or unspecified chronic kidney disease: Secondary | ICD-10-CM | POA: Diagnosis not present

## 2016-06-08 DIAGNOSIS — Z87891 Personal history of nicotine dependence: Secondary | ICD-10-CM | POA: Insufficient documentation

## 2016-06-08 DIAGNOSIS — N183 Chronic kidney disease, stage 3 (moderate): Secondary | ICD-10-CM | POA: Diagnosis not present

## 2016-06-08 DIAGNOSIS — Z7982 Long term (current) use of aspirin: Secondary | ICD-10-CM | POA: Insufficient documentation

## 2016-06-08 DIAGNOSIS — Z79899 Other long term (current) drug therapy: Secondary | ICD-10-CM | POA: Insufficient documentation

## 2016-06-08 DIAGNOSIS — E10649 Type 1 diabetes mellitus with hypoglycemia without coma: Secondary | ICD-10-CM | POA: Insufficient documentation

## 2016-06-08 DIAGNOSIS — E1040 Type 1 diabetes mellitus with diabetic neuropathy, unspecified: Secondary | ICD-10-CM | POA: Diagnosis not present

## 2016-06-08 DIAGNOSIS — E161 Other hypoglycemia: Secondary | ICD-10-CM

## 2016-06-08 LAB — CBC WITH DIFFERENTIAL/PLATELET
BASOS ABS: 0 10*3/uL (ref 0.0–0.1)
Basophils Relative: 0 %
EOS ABS: 0.2 10*3/uL (ref 0.0–0.7)
EOS PCT: 2 %
HCT: 39.8 % (ref 36.0–46.0)
Hemoglobin: 13.1 g/dL (ref 12.0–15.0)
LYMPHS PCT: 12 %
Lymphs Abs: 1.1 10*3/uL (ref 0.7–4.0)
MCH: 28.8 pg (ref 26.0–34.0)
MCHC: 32.9 g/dL (ref 30.0–36.0)
MCV: 87.5 fL (ref 78.0–100.0)
Monocytes Absolute: 0.3 10*3/uL (ref 0.1–1.0)
Monocytes Relative: 3 %
Neutro Abs: 7.8 10*3/uL — ABNORMAL HIGH (ref 1.7–7.7)
Neutrophils Relative %: 83 %
PLATELETS: 511 10*3/uL — AB (ref 150–400)
RBC: 4.55 MIL/uL (ref 3.87–5.11)
RDW: 15 % (ref 11.5–15.5)
WBC: 9.3 10*3/uL (ref 4.0–10.5)

## 2016-06-08 LAB — COMPREHENSIVE METABOLIC PANEL
ALT: 90 U/L — ABNORMAL HIGH (ref 14–54)
AST: 38 U/L (ref 15–41)
Albumin: 3.1 g/dL — ABNORMAL LOW (ref 3.5–5.0)
Alkaline Phosphatase: 175 U/L — ABNORMAL HIGH (ref 38–126)
Anion gap: 10 (ref 5–15)
BILIRUBIN TOTAL: 0.5 mg/dL (ref 0.3–1.2)
BUN: 60 mg/dL — AB (ref 6–20)
CO2: 15 mmol/L — ABNORMAL LOW (ref 22–32)
Calcium: 7.5 mg/dL — ABNORMAL LOW (ref 8.9–10.3)
Chloride: 111 mmol/L (ref 101–111)
Creatinine, Ser: 2.35 mg/dL — ABNORMAL HIGH (ref 0.44–1.00)
GFR calc Af Amer: 28 mL/min — ABNORMAL LOW (ref >60)
GFR, EST NON AFRICAN AMERICAN: 24 mL/min — AB (ref >60)
Glucose, Bld: 245 mg/dL — ABNORMAL HIGH (ref 65–99)
POTASSIUM: 4.6 mmol/L (ref 3.5–5.1)
Sodium: 136 mmol/L (ref 135–145)
TOTAL PROTEIN: 6.6 g/dL (ref 6.5–8.1)

## 2016-06-08 LAB — URINALYSIS, ROUTINE W REFLEX MICROSCOPIC
BILIRUBIN URINE: NEGATIVE
Glucose, UA: 150 mg/dL — AB
Hgb urine dipstick: NEGATIVE
KETONES UR: NEGATIVE mg/dL
LEUKOCYTES UA: NEGATIVE
NITRITE: NEGATIVE
PH: 5 (ref 5.0–8.0)
Protein, ur: 100 mg/dL — AB
Specific Gravity, Urine: 1.015 (ref 1.005–1.030)

## 2016-06-08 LAB — CBG MONITORING, ED
GLUCOSE-CAPILLARY: 46 mg/dL — AB (ref 65–99)
Glucose-Capillary: 94 mg/dL (ref 65–99)
Glucose-Capillary: 229 mg/dL — ABNORMAL HIGH (ref 65–99)
Glucose-Capillary: 219 mg/dL — ABNORMAL HIGH (ref 65–99)

## 2016-06-08 LAB — PREGNANCY, URINE: Preg Test, Ur: NEGATIVE

## 2016-06-08 NOTE — Telephone Encounter (Signed)
Patient is calling on the status of her pump, please advise

## 2016-06-08 NOTE — Discharge Instructions (Signed)
Please read and follow all provided instructions.  Your diagnoses today include:  1. Hypoglycemic reaction     Tests performed today include:  Blood counts and electrolytes  Urine test - negative pregnancy, no infection  Vital signs. See below for your results today.   Medications prescribed:   None  Take any prescribed medications only as directed.  Home care instructions:  Follow any educational materials contained in this packet.  BE VERY CAREFUL not to take multiple medicines containing Tylenol (also called acetaminophen). Doing so can lead to an overdose which can damage your liver and cause liver failure and possibly death.   Follow-up instructions: Please follow-up with your primary care provider to discuss this episode and your insulin regimen.   Return instructions:   Please return to the Emergency Department if you experience worsening symptoms.   Please return if you have any other emergent concerns.  Additional Information:  Your vital signs today were: BP 116/72 (BP Location: Right Arm)    Pulse 89    Resp 18    LMP 05/16/2016    SpO2 98%  If your blood pressure (BP) was elevated above 135/85 this visit, please have this repeated by your doctor within one month. --------------

## 2016-06-08 NOTE — ED Notes (Signed)
Bed: PP89 Expected date:  Expected time:  Means of arrival:  Comments: EMS- 46yo F, hypoglycemia/AMS

## 2016-06-08 NOTE — ED Notes (Signed)
One unsuccessful attempt at peripheral iv.

## 2016-06-08 NOTE — ED Triage Notes (Signed)
Per EMS, pt is from home and has been sick for the past several days and taking doxycycline. Pt is a type 1 diabetic. Pt room mate called after finding pt disoriented with slurred speech. Pt took oral glucose pills, peanut butter, cookies, and orange juice. Pt reported removing insulin pump prior to EMS arrival.

## 2016-06-08 NOTE — ED Provider Notes (Signed)
[facility] DEPT Provider Note   CSN: [id number] Arrival date & time: 06/08/16  0867     History   Chief Complaint No chief complaint on file.   HPI [name] is a 46 y.o. female.  Patient with history of insulin-dependent diabetes -- presents with complaint of hypoglycemic episode. Patient has been on doxycycline for the past several days for URI sx. She was having diarrhea last night. Patient states she was using the restroom and began feeling very lightheaded. She was able to make it to her bed and pull out her insulin pump. EMS was called by her roommate. EMS gave oral glucose tablets, peanut butter, cookies, orange juice. Patient's symptoms improved. She denies any recent fever, vomiting, chest pain, or abdominal pain. No skin rashes. Patient has history of amputations due to osteomyelitis. The onset of this condition was acute. The course is constant. Aggravating factors: none.      Past Medical History:  Diagnosis Date  . Anemia   . C. difficile diarrhea 09/26/2014  . Cataracts, both eyes   . Cellulitis of right foot 06/02/2014  . CKD (chronic kidney disease) stage 3, GFR 30-59 ml/min    sees Dr Justin Mend- Stage IV  . Diabetic ulcer of right foot ([place]) 06/02/2014  . DVT (deep venous thrombosis) ([place]) 10/2014   one in each one in leg- PICC line   . H/O seasonal allergies   . Heart murmur    told once when she was pregnant- no furter mention- was in notes 11/2015- had Echo 8 /4/17  . History of blood transfusion   . Hypercholesteremia   . Hypertension   . Left foot infection   . Neuropathy (HCC)    feet  . Neuropathy in diabetes ([place])   . Peripheral vascular disease ([place])   . Staphylococcus aureus bacteremia 10/2014  . Type 1 diabetes [facility])    onset age 89    Patient Active Problem List   Diagnosis Date Noted  . Boil of scalp 11/04/2015  . Boil of left cheek 11/04/2015  . Boil of lower abdomen 11/04/2015  . MRSA cellulitis 11/04/2015  . Hyponatremia  11/03/2015  . Bacterial folliculitis 61/95/0932  . Heart murmur 11/03/2015  . Protein-calorie malnutrition, moderate ([place]) 10/18/2014  . Left leg DVT ([place]) 10/18/2014  . DVT of upper extremity (deep vein thrombosis) ([place])   . Staphylococcus aureus bacteremia   . Cellulitis of left foot 10/13/2014  . Type 1 diabetes mellitus with hyperglycemia ([place]) 10/13/2014  . Chronic anemia 10/13/2014  . Hyperkalemia 10/06/2014  . Bilateral lower extremity edema 10/04/2014  . Status post transmetatarsal amputation of right foot ([place]) 09/26/2014  . Diabetic ulcer of left foot associated with type 1 diabetes mellitus ([place]) 09/26/2014  . Acute kidney injury superimposed on chronic kidney disease ([place]) 06/13/2014  . DM type 1 causing renal disease ([place]) 06/13/2013  . Osteomyelitis ([place]) 06/12/2013  . CKD (chronic kidney disease), stage III 06/12/2013  . UTI (urinary tract infection) 03/25/2013  . CAP (community acquired pneumonia) 03/25/2013  . Acute blood loss anemia 03/25/2013  . Acidosis 03/25/2013  . Leukocytosis, unspecified 03/25/2013  . Iron deficiency anemia 05/09/2011  . Charcot foot due to diabetes mellitus ([place]) 05/09/2011  . Foot ulcer due to secondary DM ([place]) 05/09/2011  . Type 1 diabetes mellitus with neurological manifestations, uncontrolled ([place])   . Hypertension   . Hypercholesteremia     Past Surgical History:  Procedure Laterality Date  . AMPUTATION TOE Left 07/24/2015   5th  toe and Hallux amputation  . CESAREAN SECTION    . EXOSTECTECTOMY TOE Left 04/11/2016   Procedure: EXOSTECTECTOMY CUBOID AND 5TH METATARSAL AND PLACEMENT OF ANTIBIOTIC BEADS;  Surgeon: Edrick Kins, DPM;  Location: [facility];  Service: Podiatry;  Laterality: Left;  . EYE SURGERY Bilateral    [place]azer  . FEMORAL-POPLITEAL BYPASS GRAFT Left 05/03/2015  . FOOT AMPUTATION THROUGH METATARSAL Right 2016  . hemrrhoidectomy    . I&D EXTREMITY Right 06/10/2014   Procedure: IRRIGATION AND DEBRIDEMENT Right Foot;   Surgeon: Newt Minion, MD;  Location: [facility];  Service: Orthopedics;  Laterality: Right;  . INCISION AND DRAINAGE Left 04/11/2016   Procedure: INCISION AND DRAINAGE A DEEP COMPLICATED WOUND OF LEFT FOOT;  Surgeon: Edrick Kins, DPM;  Location: [facility];  Service: Podiatry;  Laterality: Left;  . PERIPHERALLY INSERTED CENTRAL CATHETER INSERTION    . SKIN GRAFT Right 06/15/2014  . SKIN GRAFT Left 06/2015   foot   . SKIN SPLIT GRAFT Right 06/15/2014   Procedure: SPLIT THICKNESS SKIN GRAFT RIGHT FOOT;  Surgeon: Newt Minion, MD;  Location: [facility];  Service: Orthopedics;  Laterality: Right;  . TRANSMETATARSAL AMPUTATION Right   . TRANSMETATARSAL AMPUTATION Right 09/11/2014    OB History    Gravida Para Term Preterm AB Living   1             SAB TAB Ectopic Multiple Live Births                   Home Medications    Prior to Admission medications   Medication Sig Start Date End Date Taking? Authorizing Provider  albuterol (PROVENTIL HFA;VENTOLIN HFA) 108 (90 BASE) MCG/ACT inhaler Inhale 2 puffs into the lungs every 6 (six) hours as needed. Reported on 04/15/2015    Historical Provider, MD  amLODipine (NORVASC) 2.5 MG tablet Take 1 tablet (2.5 mg total) by mouth daily. 05/15/16   Elayne Snare, MD  aspirin 325 MG tablet Take 325 mg by mouth daily.    Historical Provider, MD  calcitRIOL (ROCALTROL) 0.5 MCG capsule Take 1 capsule by mouth daily. 03/13/16   Historical Provider, MD  Calcium Carbonate-Vitamin D (CALCIUM 600+D) 600-400 MG-UNIT tablet Take 1 tablet by mouth daily.    Historical Provider, MD  doxycycline (VIBRA-TABS) 100 MG tablet TAKE 1 TABLET BY MOUTH TWICE A DAY 05/11/16   Edrick Kins, DPM  FLUoxetine (PROZAC) 20 MG capsule Take 1 capsule by mouth daily. 03/23/16   Historical Provider, MD  folic acid (FOLVITE) 119 MCG tablet Take 400 mcg by mouth daily.    Historical Provider, MD  furosemide (LASIX) 40 MG tablet Take 40 mg by mouth daily.     Historical Provider, MD  glucose blood (BAYER  CONTOUR NEXT TEST) test strip Use as instructed to check blood sugar 4 times per day dx code E10.65 12/07/15   Elayne Snare, MD  HUMALOG 100 UNIT/ML injection CALL MD IF <70, IF 151-200 =2UNITS,201-250 =4 UNITS,251-300 =6 UNITS, 301-350 =8 UNITS,351-400 =10 U 04/24/16   Elayne Snare, MD  HYDROcodone-acetaminophen (NORCO) 10-325 MG tablet Take 1 tablet by mouth every 8 (eight) hours as needed. 05/28/16   Edrick Kins, DPM  norethindrone (AYGESTIN) 5 MG tablet Take 1 tablet by mouth See admin instructions. Take 1 tablet daily for 14 days and off 14 days.Marland Kitchen    Historical Provider, MD  oxyCODONE-acetaminophen (PERCOCET/ROXICET) 5-325 MG tablet Take 1 tablet by mouth every 6 (six) hours as needed for severe pain.  04/11/16   Edrick Kins, DPM  pravastatin (PRAVACHOL) 40 MG tablet TAKE 1 TABLET (40 MG TOTAL) BY MOUTH DAILY. 05/07/16   Elayne Snare, MD  pyridoxine (B-6) 100 MG tablet Take 100 mg by mouth daily. Reported on 04/15/2015    Historical Provider, MD  sulfamethoxazole-trimethoprim (BACTRIM DS,SEPTRA DS) 800-160 MG tablet Take 1 tablet by mouth 2 (two) times daily. 04/09/16   Edrick Kins, DPM  vitamin C (ASCORBIC ACID) 500 MG tablet Take 500 mg by mouth daily.    Historical Provider, MD  Vitamin D, Ergocalciferol, (DRISDOL) 50000 units CAPS capsule TAKE ONE CAPSULE BY MOUTH EVERY SUNDAY EVENING 05/28/16   Elayne Snare, MD    Family History Family History  Problem Relation Age of Onset  . Hyperlipidemia Mother   . Dementia Mother     Social History Social History  Substance Use Topics  . Smoking status: Former Smoker    Years: 15.00    Quit date: 05/16/2008  . Smokeless tobacco: Never Used  . Alcohol use No     Comment: Occasional     Allergies   Cleocin [clindamycin hcl]; Lisinopril; and Ciprofloxacin   Review of Systems Review of Systems  Constitutional: Negative for fever.  HENT: Negative for rhinorrhea and sore throat.   Eyes: Negative for redness.  Respiratory: Negative for cough.     Cardiovascular: Negative for chest pain.  Gastrointestinal: Positive for diarrhea. Negative for abdominal pain, nausea and vomiting.  Genitourinary: Negative for dysuria.  Musculoskeletal: Negative for myalgias.  Skin: Negative for rash.  Neurological: Positive for light-headedness. Negative for headaches.  Psychiatric/Behavioral: Positive for confusion.     Physical Exam Updated Vital Signs BP 114/66 (BP Location: Left Arm)   Pulse 92   Resp 18   LMP 05/16/2016   SpO2 100%   Physical Exam  Constitutional: She appears well-developed and well-nourished.  HENT:  Head: Normocephalic and atraumatic.  Eyes: Conjunctivae are normal. Right eye exhibits no discharge. Left eye exhibits no discharge.  Neck: Normal range of motion. Neck supple.  Cardiovascular: Normal rate, regular rhythm and normal heart sounds.   Pulmonary/Chest: Effort normal and breath sounds normal.  Abdominal: Soft. There is no tenderness.  Neurological: She is alert.  Skin: Skin is warm and dry.  Psychiatric: She has a normal mood and affect.  Nursing note and vitals reviewed.    ED Treatments / Results  Labs (all labs ordered are listed, but only abnormal results are displayed) Labs Reviewed  COMPREHENSIVE METABOLIC PANEL - Abnormal; Notable for the following:       Result Value   CO2 15 (*)    Glucose, Bld 245 (*)    BUN 60 (*)    Creatinine, Ser 2.35 (*)    Calcium 7.5 (*)    Albumin 3.1 (*)    ALT 90 (*)    Alkaline Phosphatase 175 (*)    GFR calc non Af Amer 24 (*)    GFR calc Af Amer 28 (*)    All other components within normal limits  CBC WITH DIFFERENTIAL/PLATELET - Abnormal; Notable for the following:    Platelets 511 (*)    Neutro Abs 7.8 (*)    All other components within normal limits  URINALYSIS, ROUTINE W REFLEX MICROSCOPIC - Abnormal; Notable for the following:    APPearance HAZY (*)    Glucose, UA 150 (*)    Protein, ur 100 (*)    Bacteria, UA RARE (*)    Squamous Epithelial /  LPF  6-30 (*)    All other components within normal limits  CBG MONITORING, ED - Abnormal; Notable for the following:    Glucose-Capillary 46 (*)    All other components within normal limits  CBG MONITORING, ED - Abnormal; Notable for the following:    Glucose-Capillary 229 (*)    All other components within normal limits  CBG MONITORING, ED - Abnormal; Notable for the following:    Glucose-Capillary 219 (*)    All other components within normal limits  PREGNANCY, URINE  CBG MONITORING, ED    Procedures Procedures (including critical care time)  Medications Ordered in ED Medications - No data to display   Initial Impression / Assessment and Plan / ED Course  I have reviewed the triage vital signs and the nursing notes.  Pertinent labs & imaging results that were available during my care of the patient were reviewed by me and considered in my medical decision making (see chart for details).     Patient seen and examined. Discussed with Dr. Ralene Bathe.    Vital signs reviewed and are as follows: BP 138/79 (BP Location: Right Arm)   Pulse 104   Resp 18   LMP 05/16/2016   SpO2 100%   Patient has eaten and drank in ED with stabilization in blood sugar. She is feeling back to her baseline. She will follow-up with her doctor regarding her insulin regimen.  Encouraged patient to eat well. Return to the emergency department with worsening symptoms.   Final Clinical Impressions(s) / ED Diagnoses   Final diagnoses:  Hypoglycemic reaction   Patient with episode of hyperglycemia, resolving upon arrival to the emergency department and stabilized during several hours of monitoring. Labs are at the patient's baseline. No further evaluation or workup felt necessary at this time. Patient appears well and will restart her insulin upon returning home.  New Prescriptions Discharge Medication List as of 06/08/2016  1:57 PM       Carlisle Cater, PA-C 06/08/16 [place],  MD 06/10/16 [phone]

## 2016-06-08 NOTE — ED Notes (Signed)
Pt provided orange juice, peanut butter, cheese stick, and crackers.

## 2016-06-09 ENCOUNTER — Encounter (HOSPITAL_COMMUNITY): Payer: Self-pay | Admitting: Emergency Medicine

## 2016-06-09 ENCOUNTER — Emergency Department (HOSPITAL_COMMUNITY)
Admission: EM | Admit: 2016-06-09 | Discharge: 2016-06-09 | Disposition: A | Discharge status: Home / Self Care | Payer: Medicare Other | Attending: Emergency Medicine | Admitting: Emergency Medicine

## 2016-06-09 DIAGNOSIS — Z87891 Personal history of nicotine dependence: Secondary | ICD-10-CM | POA: Insufficient documentation

## 2016-06-09 DIAGNOSIS — N183 Chronic kidney disease, stage 3 (moderate): Secondary | ICD-10-CM | POA: Diagnosis not present

## 2016-06-09 DIAGNOSIS — Z7982 Long term (current) use of aspirin: Secondary | ICD-10-CM | POA: Insufficient documentation

## 2016-06-09 DIAGNOSIS — I129 Hypertensive chronic kidney disease with stage 1 through stage 4 chronic kidney disease, or unspecified chronic kidney disease: Secondary | ICD-10-CM | POA: Diagnosis not present

## 2016-06-09 DIAGNOSIS — E1022 Type 1 diabetes mellitus with diabetic chronic kidney disease: Secondary | ICD-10-CM | POA: Insufficient documentation

## 2016-06-09 DIAGNOSIS — E10649 Type 1 diabetes mellitus with hypoglycemia without coma: Secondary | ICD-10-CM | POA: Diagnosis not present

## 2016-06-09 DIAGNOSIS — E162 Hypoglycemia, unspecified: Secondary | ICD-10-CM

## 2016-06-09 LAB — CBC WITH DIFFERENTIAL/PLATELET
BASOS PCT: 0 %
Basophils Absolute: 0 10*3/uL (ref 0.0–0.1)
EOS ABS: 0.5 10*3/uL (ref 0.0–0.7)
Eosinophils Relative: 7 %
HCT: 38.5 % (ref 36.0–46.0)
Hemoglobin: 12.6 g/dL (ref 12.0–15.0)
Lymphocytes Relative: 14 %
Lymphs Abs: 1.1 10*3/uL (ref 0.7–4.0)
MCH: 28.7 pg (ref 26.0–34.0)
MCHC: 32.7 g/dL (ref 30.0–36.0)
MCV: 87.7 fL (ref 78.0–100.0)
MONO ABS: 0.2 10*3/uL (ref 0.1–1.0)
MONOS PCT: 3 %
NEUTROS PCT: 76 %
Neutro Abs: 6.2 10*3/uL (ref 1.7–7.7)
Platelets: 540 10*3/uL — ABNORMAL HIGH (ref 150–400)
RBC: 4.39 MIL/uL (ref 3.87–5.11)
RDW: 15.2 % (ref 11.5–15.5)
WBC: 8.1 10*3/uL (ref 4.0–10.5)

## 2016-06-09 LAB — BLOOD GAS, ARTERIAL
ACID-BASE DEFICIT: 15.7 mmol/L — AB (ref 0.0–2.0)
Bicarbonate: 10.1 mmol/L — ABNORMAL LOW (ref 20.0–28.0)
FIO2: 0.21
O2 SAT: 96.8 %
PO2 ART: 111 mmHg — AB (ref 83.0–108.0)
Patient temperature: 98.6
pCO2 arterial: 24.2 mmHg — ABNORMAL LOW (ref 32.0–48.0)
pH, Arterial: 7.244 — ABNORMAL LOW (ref 7.350–7.450)

## 2016-06-09 LAB — URINALYSIS, ROUTINE W REFLEX MICROSCOPIC
BILIRUBIN URINE: NEGATIVE
Glucose, UA: >=500 mg/dL — AB
HGB URINE DIPSTICK: NEGATIVE
Ketones, ur: NEGATIVE mg/dL
Leukocytes, UA: NEGATIVE
NITRITE: NEGATIVE
PH: 5 (ref 5.0–8.0)
Protein, ur: 100 mg/dL — AB
SPECIFIC GRAVITY, URINE: 1.013 (ref 1.005–1.030)

## 2016-06-09 LAB — CBG MONITORING, ED: Glucose-Capillary: 286 mg/dL — ABNORMAL HIGH (ref 65–99)

## 2016-06-09 LAB — BASIC METABOLIC PANEL
Anion gap: 8 (ref 5–15)
BUN: 58 mg/dL — ABNORMAL HIGH (ref 6–20)
CALCIUM: 7.2 mg/dL — AB (ref 8.9–10.3)
CO2: 13 mmol/L — ABNORMAL LOW (ref 22–32)
CREATININE: 2.18 mg/dL — AB (ref 0.44–1.00)
Chloride: 113 mmol/L — ABNORMAL HIGH (ref 101–111)
GFR, EST AFRICAN AMERICAN: 30 mL/min — AB (ref >60)
GFR, EST NON AFRICAN AMERICAN: 26 mL/min — AB (ref >60)
Glucose, Bld: 179 mg/dL — ABNORMAL HIGH (ref 65–99)
Potassium: 4.4 mmol/L (ref 3.5–5.1)
SODIUM: 134 mmol/L — AB (ref 135–145)

## 2016-06-09 NOTE — ED Provider Notes (Signed)
[facility] DEPT Provider Note   CSN: [id number] Arrival date & time: 06/09/16  1337     History   Chief Complaint Chief Complaint  Patient presents with  . Hypoglycemia    HPI [name] is a 46 y.o. female.  HPI   46 year old type I diabetic with insulin pump and also with sliding scale insulin presents to the emergency department secondary rest secondary to hypoglycemia. Patient states that her blood sugar yesterday morning was 44 however she still will talk with that we're she came in for evaluation she had stable blood sugars here was sent home to follow-up with her primary doctor. Happen again today worse 50 but she had decreased responsiveness so came in for evaluation again. She took her insulin pump out prior to coming. She states that she had her insulin regimen increased last week. She also has a recent osteomyelitis that she just finished doxycycline for of her left foot. No problems with this. She does not have any chest pain, fever, nausea or vomiting. She once again took her insulin pump off prior to coming today. No other associated or modifying symptoms.  Past Medical History:  Diagnosis Date  . Anemia   . C. difficile diarrhea 09/26/2014  . Cataracts, both eyes   . Cellulitis of right foot 06/02/2014  . CKD (chronic kidney disease) stage 3, GFR 30-59 ml/min    sees Dr Justin Mend- Stage IV  . Diabetic ulcer of right foot ([place]) 06/02/2014  . DVT (deep venous thrombosis) ([place]) 10/2014   one in each one in leg- PICC line   . H/O seasonal allergies   . Heart murmur    told once when she was pregnant- no furter mention- was in notes 11/2015- had Echo 8 /4/17  . History of blood transfusion   . Hypercholesteremia   . Hypertension   . Left foot infection   . Neuropathy (HCC)    feet  . Neuropathy in diabetes ([place])   . Peripheral vascular disease ([place])   . Staphylococcus aureus bacteremia 10/2014  . Type 1 diabetes [facility])    onset age 89    Patient Active  Problem List   Diagnosis Date Noted  . Boil of scalp 11/04/2015  . Boil of left cheek 11/04/2015  . Boil of lower abdomen 11/04/2015  . MRSA cellulitis 11/04/2015  . Hyponatremia 11/03/2015  . Bacterial folliculitis 16/09/3708  . Heart murmur 11/03/2015  . Protein-calorie malnutrition, moderate ([place]) 10/18/2014  . Left leg DVT ([place]) 10/18/2014  . DVT of upper extremity (deep vein thrombosis) ([place])   . Staphylococcus aureus bacteremia   . Cellulitis of left foot 10/13/2014  . Type 1 diabetes mellitus with hyperglycemia ([place]) 10/13/2014  . Chronic anemia 10/13/2014  . Hyperkalemia 10/06/2014  . Bilateral lower extremity edema 10/04/2014  . Status post transmetatarsal amputation of right foot ([place]) 09/26/2014  . Diabetic ulcer of left foot associated with type 1 diabetes mellitus ([place]) 09/26/2014  . Acute kidney injury superimposed on chronic kidney disease ([place]) 06/13/2014  . DM type 1 causing renal disease ([place]) 06/13/2013  . Osteomyelitis ([place]) 06/12/2013  . CKD (chronic kidney disease), stage III 06/12/2013  . UTI (urinary tract infection) 03/25/2013  . CAP (community acquired pneumonia) 03/25/2013  . Acute blood loss anemia 03/25/2013  . Acidosis 03/25/2013  . Leukocytosis, unspecified 03/25/2013  . Iron deficiency anemia 05/09/2011  . Charcot foot due to diabetes mellitus ([place]) 05/09/2011  . Foot ulcer due to secondary DM ([place]) 05/09/2011  . Type  1 diabetes mellitus with neurological manifestations, uncontrolled ([place])   . Hypertension   . Hypercholesteremia     Past Surgical History:  Procedure Laterality Date  . AMPUTATION TOE Left 07/24/2015   5th toe and Hallux amputation  . CESAREAN SECTION    . EXOSTECTECTOMY TOE Left 04/11/2016   Procedure: EXOSTECTECTOMY CUBOID AND 5TH METATARSAL AND PLACEMENT OF ANTIBIOTIC BEADS;  Surgeon: Edrick Kins, DPM;  Location: [facility];  Service: Podiatry;  Laterality: Left;  . EYE SURGERY Bilateral    [place]  . FEMORAL-POPLITEAL BYPASS  GRAFT Left 05/03/2015  . FOOT AMPUTATION THROUGH METATARSAL Right 2016  . hemrrhoidectomy    . I&D EXTREMITY Right 06/10/2014   Procedure: IRRIGATION AND DEBRIDEMENT Right Foot;  Surgeon: Newt Minion, MD;  Location: [facility];  Service: Orthopedics;  Laterality: Right;  . INCISION AND DRAINAGE Left 04/11/2016   Procedure: INCISION AND DRAINAGE A DEEP COMPLICATED WOUND OF LEFT FOOT;  Surgeon: Edrick Kins, DPM;  Location: [facility];  Service: Podiatry;  Laterality: Left;  . PERIPHERALLY INSERTED CENTRAL CATHETER INSERTION    . SKIN GRAFT Right 06/15/2014  . SKIN GRAFT Left 06/2015   foot   . SKIN SPLIT GRAFT Right 06/15/2014   Procedure: SPLIT THICKNESS SKIN GRAFT RIGHT FOOT;  Surgeon: Newt Minion, MD;  Location: [facility];  Service: Orthopedics;  Laterality: Right;  . TRANSMETATARSAL AMPUTATION Right   . TRANSMETATARSAL AMPUTATION Right 09/11/2014    OB History    Gravida Para Term Preterm AB Living   1             SAB TAB Ectopic Multiple Live Births                   Home Medications    Prior to Admission medications   Medication Sig Start Date End Date Taking? Authorizing Provider  albuterol (PROVENTIL HFA;VENTOLIN HFA) 108 (90 BASE) MCG/ACT inhaler Inhale 2 puffs into the lungs every 6 (six) hours as needed for wheezing or shortness of breath. Reported on 04/15/2015   Yes Historical Provider, MD  amLODipine (NORVASC) 2.5 MG tablet Take 1 tablet (2.5 mg total) by mouth daily. 05/15/16  Yes Elayne Snare, MD  aspirin 325 MG tablet Take 325 mg by mouth daily.   Yes Historical Provider, MD  calcitRIOL (ROCALTROL) 0.5 MCG capsule Take 1 capsule by mouth daily. 03/13/16  Yes Historical Provider, MD  Calcium-Phosphorus-Vitamin D (CALCIUM GUMMIES) [phone] MG-MG-UNIT CHEW Chew 2 each by mouth daily.   Yes Historical Provider, MD  CINNAMON PO Take 1 tablet by mouth daily.   Yes Historical Provider, MD  Cyanocobalamin (VITAMIN B12 PO) Take 1 each by mouth daily.   Yes Historical Provider, MD    folic acid (FOLVITE) 1 MG tablet Take 1 mg by mouth daily.   Yes Historical Provider, MD  furosemide (LASIX) 40 MG tablet Take 40 mg by mouth daily as needed for fluid.    Yes Historical Provider, MD  HUMALOG 100 UNIT/ML injection CALL MD IF <70, IF 151-200 =2UNITS,201-250 =4 UNITS,251-300 =6 UNITS, 301-350 =8 UNITS,351-400 =10 U Patient taking differently: Use's with insulin pump 04/24/16  Yes Elayne Snare, MD  Insulin Human (INSULIN PUMP) SOLN Inject into the skin. 15 carbs = 1 unit, every 50 sugars = 1 unit   Yes Historical Provider, MD  Multiple Minerals-Vitamins (NUTRA-SUPPORT BONE) CAPS Take 1 each by mouth daily.   Yes Historical Provider, MD  Multiple Vitamins-Minerals (VISION PLUS) CAPS Take 1 each by mouth daily.  Yes Historical Provider, MD  pravastatin (PRAVACHOL) 40 MG tablet TAKE 1 TABLET (40 MG TOTAL) BY MOUTH DAILY. 05/07/16  Yes Elayne Snare, MD  pyridoxine (B-6) 100 MG tablet Take 100 mg by mouth daily. Reported on 04/15/2015   Yes Historical Provider, MD  vitamin C (ASCORBIC ACID) 500 MG tablet Take 500 mg by mouth daily.   Yes Historical Provider, MD  Vitamin D, Ergocalciferol, (DRISDOL) 50000 units CAPS capsule TAKE ONE CAPSULE BY MOUTH EVERY SUNDAY EVENING 05/28/16  Yes Elayne Snare, MD  glucose blood (BAYER CONTOUR NEXT TEST) test strip Use as instructed to check blood sugar 4 times per day dx code E10.65 12/07/15   Elayne Snare, MD  norethindrone (AYGESTIN) 5 MG tablet Take 1 tablet by mouth See admin instructions. Take 1 tablet daily for 14 days and off 14 days.Marland Kitchen    Historical Provider, MD    Family History Family History  Problem Relation Age of Onset  . Hyperlipidemia Mother   . Dementia Mother     Social History Social History  Substance Use Topics  . Smoking status: Former Smoker    Years: 15.00    Quit date: 05/16/2008  . Smokeless tobacco: Never Used  . Alcohol use No     Comment: Occasional     Allergies   Cleocin [clindamycin hcl]; Lisinopril; and  Ciprofloxacin   Review of Systems Review of Systems  All other systems reviewed and are negative.    Physical Exam Updated Vital Signs BP 131/91 (BP Location: Right Arm)   Pulse 103   Temp 97.9 F (36.6 C) (Oral)   Resp 14   Ht 5\' 7"  (1.702 m)   Wt 130 lb (59 kg)   LMP 05/16/2016   SpO2 100%   BMI 20.36 kg/m   Physical Exam  Constitutional: She appears well-developed and well-nourished.  HENT:  Head: Normocephalic and atraumatic.  Eyes: Conjunctivae and EOM are normal.  Neck: Normal range of motion.  Cardiovascular: Normal rate and regular rhythm.   Pulmonary/Chest: No stridor. No respiratory distress.  Abdominal: Soft. She exhibits no distension.  Neurological: She is alert.  Skin: Skin is warm and dry.  Well healing wound with packing in place to left lateral foot. No surrounding erythema, ttp, warmth or swelling  Nursing note and vitals reviewed.    ED Treatments / Results  Labs (all labs ordered are listed, but only abnormal results are displayed) Labs Reviewed  CBC WITH DIFFERENTIAL/PLATELET - Abnormal; Notable for the following:       Result Value   Platelets 540 (*)    All other components within normal limits  BASIC METABOLIC PANEL - Abnormal; Notable for the following:    Sodium 134 (*)    Chloride 113 (*)    CO2 13 (*)    Glucose, Bld 179 (*)    BUN 58 (*)    Creatinine, Ser 2.18 (*)    Calcium 7.2 (*)    GFR calc non Af Amer 26 (*)    GFR calc Af Amer 30 (*)    All other components within normal limits  URINALYSIS, ROUTINE W REFLEX MICROSCOPIC - Abnormal; Notable for the following:    Glucose, UA >=500 (*)    Protein, ur 100 (*)    Bacteria, UA RARE (*)    Squamous Epithelial / LPF 0-5 (*)    All other components within normal limits  BLOOD GAS, ARTERIAL - Abnormal; Notable for the following:    pH, Arterial 7.244 (*)  pCO2 arterial 24.2 (*)    pO2, Arterial 111 (*)    Bicarbonate 10.1 (*)    Acid-base deficit 15.7 (*)    All other  components within normal limits  CBG MONITORING, ED - Abnormal; Notable for the following:    Glucose-Capillary 286 (*)    All other components within normal limits    EKG  EKG Interpretation None       Radiology No results found.  Procedures Procedures (including critical care time)  Medications Ordered in ED Medications - No data to display   Initial Impression / Assessment and Plan / ED Course  I have reviewed the triage vital signs and the nursing notes.  Pertinent labs & imaging results that were available during my care of the patient were reviewed by me and considered in my medical decision making (see chart for details).    Suspect hypoglycemia secondary to changes in her insulin regimen. We'll suggest that she turns her insulin pump off at night and restarting in the morning as she has not had issues with DKA and hyperglycemia is obviously better than hypoglycemia. Otherwise will follow up with endocrinologist on Monday. We will check labs here to make sure that they are similar to yesterday. Multiple hours of observation, cbg appropriate. Plan for dc home with decreased insulin pump time and endocrine follow up.   Final Clinical Impressions(s) / ED Diagnoses   Final diagnoses:  Hypoglycemia      Merrily Pew, MD 06/10/16 1259

## 2016-06-09 NOTE — ED Notes (Signed)
Bed: WA07 Expected date:  Expected time:  Means of arrival:  Comments: 46 yo hypoglycemia

## 2016-06-09 NOTE — ED Triage Notes (Signed)
Per EMS, patient is from home.  Pt is type 1 diabetic.  Pt's room mate found the patient unconscious with a CBG of 50 per EMS. EMS administered IM glucagon in left deltoid.  During transport, patient is now A & O x4.     BP: 124/86 HR: 88 R:16 O2:  98% on room air  CBG: 74 after glucagon

## 2016-06-12 ENCOUNTER — Encounter (HOSPITAL_COMMUNITY): Payer: Self-pay | Admitting: Emergency Medicine

## 2016-06-12 ENCOUNTER — Emergency Department (HOSPITAL_COMMUNITY): Payer: Medicare Other

## 2016-06-12 ENCOUNTER — Emergency Department (HOSPITAL_COMMUNITY)
Admission: EM | Admit: 2016-06-12 | Discharge: 2016-06-12 | Disposition: A | Discharge status: Home / Self Care | Payer: Medicare Other | Attending: Emergency Medicine | Admitting: Emergency Medicine

## 2016-06-12 DIAGNOSIS — M79672 Pain in left foot: Secondary | ICD-10-CM | POA: Diagnosis not present

## 2016-06-12 DIAGNOSIS — Z5321 Procedure and treatment not carried out due to patient leaving prior to being seen by health care provider: Secondary | ICD-10-CM | POA: Insufficient documentation

## 2016-06-12 NOTE — ED Notes (Signed)
Pt stated that she only needed the x-ray of foot so that it would be available for the doctor she is seeing tomorrow at [facility].  Pt advised that she her doctor should be able to view the x-ray.  Pt stated that she wanted to leave.  Pt LWBS

## 2016-06-12 NOTE — ED Triage Notes (Signed)
Pt c/o L foot pain and infection - states this is an ongoing issue for 2 years. Pt reports some of the foot has already been removed and she currently has a hole. Sees orthopedic surgeon ([name]) with [facility] and has taken multiple courses of antibiotics for it. Pt here for an x-ray to see if it has gone into her bone. Denies fevers. Pt states she doesn't want anything but an x-ray.

## 2016-06-12 NOTE — ED Notes (Signed)
Patient refusing blood work, states she is only here for an x-ray.

## 2016-06-13 ENCOUNTER — Telehealth: Payer: Self-pay | Admitting: Nutrition

## 2016-06-13 ENCOUNTER — Encounter: Payer: Medicare Other | Attending: Endocrinology | Admitting: Nutrition

## 2016-06-13 DIAGNOSIS — Z713 Dietary counseling and surveillance: Secondary | ICD-10-CM | POA: Insufficient documentation

## 2016-06-13 DIAGNOSIS — E10621 Type 1 diabetes mellitus with foot ulcer: Secondary | ICD-10-CM | POA: Diagnosis not present

## 2016-06-13 DIAGNOSIS — E1021 Type 1 diabetes mellitus with diabetic nephropathy: Secondary | ICD-10-CM

## 2016-06-13 DIAGNOSIS — E1065 Type 1 diabetes mellitus with hyperglycemia: Secondary | ICD-10-CM | POA: Insufficient documentation

## 2016-06-13 NOTE — Telephone Encounter (Signed)
Cordia came into the office saying she has been in the ER 3X in the last few days, with low blood sugars Friday at 11AM was not able to drink anything.  No bolus given that morning.  When she got back from ER at 4PM, blood sugar was over 400.  Did a correction bolus and blood sugar dropped at 10PM to 41.  She had  also eaten a [place] sandwich with 6 ounces of Coke, at [employer] that night with no bolus. On Saturday, blood sugar at 1PM dropped low and she was unresponsive.  Took her to the ER, Tuesday night, ate snack food from 9PM to MN, with no bolus.  At HS blood sugar was 199.  Fbs today was 71.   Basal rate decreased per Dr. Ronnie Derby voice/written to MN: 0.35 (from 0.5), 12PM: 0.45 (from 0.6)., and 3:30PM: 0.35 (from 0.5) ISF changed from 40 to 55.

## 2016-06-13 NOTE — Telephone Encounter (Signed)
Note reviewed and is correct

## 2016-06-14 NOTE — Progress Notes (Signed)
See Telephone encounter to [name]-06/13/16 Pt experiencing many lows. Dropping low despite not giving boluses when eating.  One low due to a correction dose when her blood sugar was 400.   Basal rates:MN: 0.5, 12PM: 0.6, 3:30PM: 0.5.   Per Dr. Ronnie Derby written order, Basal rate changed to MN: 0.35, 12PM: 0.6, 3:30PM: 0.5.  Her ISF was also changed from 40 to 55.    She was told that because her basal rate was reduced, she will need to bolus for meals eaten, before she eats.  She reported good understanding of this.   She had no final questions.

## 2016-06-15 ENCOUNTER — Ambulatory Visit: Payer: Medicare Other | Admitting: Endocrinology

## 2016-06-18 NOTE — Telephone Encounter (Signed)
Patient no showed today's appt. Please advise on how to follow up. °A. No follow up necessary. °B. Follow up urgent. Contact patient immediately. °C. Follow up necessary. Contact patient and schedule visit in ___ days. °D. Follow up advised. Contact patient and schedule visit in ____weeks. ° °

## 2016-06-18 NOTE — Telephone Encounter (Signed)
She is scheduled for the 27th

## 2016-06-19 ENCOUNTER — Other Ambulatory Visit: Payer: Self-pay | Admitting: Endocrinology

## 2016-06-19 DIAGNOSIS — E1065 Type 1 diabetes mellitus with hyperglycemia: Secondary | ICD-10-CM

## 2016-06-20 ENCOUNTER — Other Ambulatory Visit: Payer: Self-pay | Admitting: Surgery

## 2016-06-20 ENCOUNTER — Ambulatory Visit (HOSPITAL_COMMUNITY)
Admission: RE | Admit: 2016-06-20 | Discharge: 2016-06-20 | Disposition: A | Discharge status: Home / Self Care | Payer: Medicare Other | Source: Ambulatory Visit | Attending: Surgery | Admitting: Surgery

## 2016-06-20 DIAGNOSIS — E10621 Type 1 diabetes mellitus with foot ulcer: Secondary | ICD-10-CM | POA: Diagnosis not present

## 2016-06-20 DIAGNOSIS — L97512 Non-pressure chronic ulcer of other part of right foot with fat layer exposed: Secondary | ICD-10-CM | POA: Insufficient documentation

## 2016-06-20 DIAGNOSIS — M868X9 Other osteomyelitis, unspecified sites: Secondary | ICD-10-CM

## 2016-06-20 DIAGNOSIS — Z9889 Other specified postprocedural states: Secondary | ICD-10-CM | POA: Diagnosis not present

## 2016-06-20 DIAGNOSIS — E11621 Type 2 diabetes mellitus with foot ulcer: Secondary | ICD-10-CM | POA: Insufficient documentation

## 2016-06-22 ENCOUNTER — Telehealth: Payer: Self-pay | Admitting: Endocrinology

## 2016-06-22 ENCOUNTER — Other Ambulatory Visit (INDEPENDENT_AMBULATORY_CARE_PROVIDER_SITE_OTHER): Payer: Medicare Other

## 2016-06-22 DIAGNOSIS — E1065 Type 1 diabetes mellitus with hyperglycemia: Secondary | ICD-10-CM | POA: Diagnosis not present

## 2016-06-22 LAB — BASIC METABOLIC PANEL
BUN: 46 mg/dL — ABNORMAL HIGH (ref 6–23)
CHLORIDE: 111 meq/L (ref 96–112)
CO2: 9 meq/L — AB (ref 19–32)
Calcium: 6.2 mg/dL — ABNORMAL LOW (ref 8.4–10.5)
Creatinine, Ser: 2.19 mg/dL — ABNORMAL HIGH (ref 0.40–1.20)
GFR: 25.84 mL/min — ABNORMAL LOW (ref >60.00)
GLUCOSE: 589 mg/dL — AB (ref 70–99)
Potassium: 4.1 mEq/L (ref 3.5–5.1)
SODIUM: 129 meq/L — AB (ref 135–145)

## 2016-06-22 NOTE — Telephone Encounter (Signed)
Patient was notified on voicemail that her blood sugar was 589, advised her to check sugars 4 times a day, take extra boluses for high readings and avoid foods and drinks that are high in carbohydrate and sugar.  She will call back on Monday to report her blood sugar readings for further adjustment Calcium is low, not clear if this is related to her renal function, forwarded labs to her nephrologist

## 2016-06-23 LAB — FRUCTOSAMINE: Fructosamine: 316 umol/L — ABNORMAL HIGH (ref 0–285)

## 2016-06-23 NOTE — Progress Notes (Signed)
Please call patient about her blood sugar being very high and was left a message on her voicemail reminding her to improve her diet and take all boluses as directed

## 2016-06-24 ENCOUNTER — Emergency Department (HOSPITAL_COMMUNITY)
Admission: EM | Admit: 2016-06-24 | Discharge: 2016-06-24 | Disposition: A | Discharge status: Home / Self Care | Payer: Medicare Other | Attending: Emergency Medicine | Admitting: Emergency Medicine

## 2016-06-24 ENCOUNTER — Encounter (HOSPITAL_COMMUNITY): Payer: Self-pay | Admitting: Emergency Medicine

## 2016-06-24 DIAGNOSIS — E1022 Type 1 diabetes mellitus with diabetic chronic kidney disease: Secondary | ICD-10-CM | POA: Diagnosis not present

## 2016-06-24 DIAGNOSIS — N289 Disorder of kidney and ureter, unspecified: Secondary | ICD-10-CM | POA: Insufficient documentation

## 2016-06-24 DIAGNOSIS — Z7982 Long term (current) use of aspirin: Secondary | ICD-10-CM | POA: Diagnosis not present

## 2016-06-24 DIAGNOSIS — R220 Localized swelling, mass and lump, head: Secondary | ICD-10-CM | POA: Diagnosis present

## 2016-06-24 DIAGNOSIS — Z87891 Personal history of nicotine dependence: Secondary | ICD-10-CM | POA: Insufficient documentation

## 2016-06-24 DIAGNOSIS — N183 Chronic kidney disease, stage 3 (moderate): Secondary | ICD-10-CM | POA: Insufficient documentation

## 2016-06-24 DIAGNOSIS — I129 Hypertensive chronic kidney disease with stage 1 through stage 4 chronic kidney disease, or unspecified chronic kidney disease: Secondary | ICD-10-CM | POA: Insufficient documentation

## 2016-06-24 DIAGNOSIS — K130 Diseases of lips: Secondary | ICD-10-CM | POA: Diagnosis not present

## 2016-06-24 DIAGNOSIS — Z79899 Other long term (current) drug therapy: Secondary | ICD-10-CM | POA: Insufficient documentation

## 2016-06-24 LAB — CBC WITH DIFFERENTIAL/PLATELET
BASOS ABS: 0 10*3/uL (ref 0.0–0.1)
BASOS PCT: 1 %
Eosinophils Absolute: 0.4 10*3/uL (ref 0.0–0.7)
Eosinophils Relative: 6 %
HEMATOCRIT: 36.2 % (ref 36.0–46.0)
HEMOGLOBIN: 11.6 g/dL — AB (ref 12.0–15.0)
LYMPHS PCT: 21 %
Lymphs Abs: 1.4 10*3/uL (ref 0.7–4.0)
MCH: 28.4 pg (ref 26.0–34.0)
MCHC: 32 g/dL (ref 30.0–36.0)
MCV: 88.5 fL (ref 78.0–100.0)
MONO ABS: 0.5 10*3/uL (ref 0.1–1.0)
Monocytes Relative: 7 %
NEUTROS ABS: 4.3 10*3/uL (ref 1.7–7.7)
NEUTROS PCT: 65 %
Platelets: 527 10*3/uL — ABNORMAL HIGH (ref 150–400)
RBC: 4.09 MIL/uL (ref 3.87–5.11)
RDW: 15.2 % (ref 11.5–15.5)
WBC: 6.6 10*3/uL (ref 4.0–10.5)

## 2016-06-24 LAB — COMPREHENSIVE METABOLIC PANEL
ALBUMIN: 2.4 g/dL — AB (ref 3.5–5.0)
ALK PHOS: 208 U/L — AB (ref 38–126)
ALT: 52 U/L (ref 14–54)
ANION GAP: 14 (ref 5–15)
AST: 26 U/L (ref 15–41)
BILIRUBIN TOTAL: 0.4 mg/dL (ref 0.3–1.2)
BUN: 50 mg/dL — ABNORMAL HIGH (ref 6–20)
CALCIUM: 6.7 mg/dL — AB (ref 8.9–10.3)
CO2: 8 mmol/L — AB (ref 22–32)
Chloride: 117 mmol/L — ABNORMAL HIGH (ref 101–111)
Creatinine, Ser: 3.03 mg/dL — ABNORMAL HIGH (ref 0.44–1.00)
GFR calc non Af Amer: 18 mL/min — ABNORMAL LOW (ref >60)
GFR, EST AFRICAN AMERICAN: 20 mL/min — AB (ref >60)
GLUCOSE: 167 mg/dL — AB (ref 65–99)
POTASSIUM: 4.2 mmol/L (ref 3.5–5.1)
SODIUM: 139 mmol/L (ref 135–145)
TOTAL PROTEIN: 5.9 g/dL — AB (ref 6.5–8.1)

## 2016-06-24 LAB — CBG MONITORING, ED: Glucose-Capillary: 170 mg/dL — ABNORMAL HIGH (ref 65–99)

## 2016-06-24 MED ORDER — CEPHALEXIN 500 MG PO CAPS: 500.0000 mg | ORAL_CAPSULE | Freq: Four times a day (QID) | ORAL | 0 refills | Status: DC

## 2016-06-24 MED ORDER — SULFAMETHOXAZOLE-TRIMETHOPRIM 800-160 MG PO TABS: 1.0000 | ORAL_TABLET | Freq: Two times a day (BID) | ORAL | 0 refills | Status: AC

## 2016-06-24 MED ORDER — LIDOCAINE HCL 2 % IJ SOLN
10.0000 mL | Freq: Once | INTRAMUSCULAR | Status: DC
  Filled 2016-06-24: qty 20

## 2016-06-24 NOTE — Discharge Instructions (Signed)
Apply bacitracin topically twice a day. Take bactrim and keflex as prescribed until all gone. Follow up if not improving in 2 days. With Psychiatric nurse.  Return if worsening.   Your lab work today showed that your renal function is slightly worse than baseline, your calcium is low, chloride and CO2 low. Please follow up for recheck with your family doctor.

## 2016-06-24 NOTE — ED Notes (Signed)
ED Provider at bedside. 

## 2016-06-24 NOTE — ED Notes (Signed)
Unable to find patient.

## 2016-06-24 NOTE — ED Triage Notes (Signed)
Pt c/o infection to lip with swelling. Pt seen by PMD for same but was told she needed to go to a Psychiatric nurse. Pt reports pus coming from wound to lip. Pt has tried bacitracin. Pt also c/o diarrhea for 1 week but has since stopped after taking the antibiotic.

## 2016-06-24 NOTE — ED Provider Notes (Signed)
[name] Provider Note   CSN: [id number] Arrival date & time: 06/24/16  1154     History   Chief Complaint Chief Complaint  Patient presents with  . Oral Swelling    HPI [name] is a 46 y.o. female.  HPI [name] is a 46 y.o. female with history of type 1 diabetes, chronic kidney disease, DVT, hypertension, anemia, history of C. difficile, presents to emergency department complaining of lower lip infection. Patient states her lip began to swell proximal and 5 days ago. She started taking doxycycline which she had left over from a different infection. She went to see her doctor 2 days ago, at that time she was switched to Bactrim because she began having diarrhea. She was told to go to emergency department for her swelling but she decided to wait it out 2 more days. She states she has been applying warm compresses and she did have some drainage from the lip. She continues to have swelling and redness to the lip. Denies any fever or chills. No nausea or vomiting. No other complaints.  Past Medical History:  Diagnosis Date  . Anemia   . C. difficile diarrhea 09/26/2014  . Cataracts, both eyes   . Cellulitis of right foot 06/02/2014  . CKD (chronic kidney disease) stage 3, GFR 30-59 ml/min    sees Dr Justin Mend- Stage IV  . Diabetic ulcer of right foot ([place]) 06/02/2014  . DVT (deep venous thrombosis) ([place]) 10/2014   one in each one in leg- PICC line   . H/O seasonal allergies   . Heart murmur    told once when she was pregnant- no furter mention- was in notes 11/2015- had Echo 8 /4/17  . History of blood transfusion   . Hypercholesteremia   . Hypertension   . Left foot infection   . Neuropathy (HCC)    feet  . Neuropathy in diabetes ([place])   . Peripheral vascular disease ([place])   . Staphylococcus aureus bacteremia 10/2014  . Type 1 diabetes [facility])    onset age 21    Patient Active Problem List   Diagnosis Date Noted  . Boil of scalp 11/04/2015  . Boil of  left cheek 11/04/2015  . Boil of lower abdomen 11/04/2015  . MRSA cellulitis 11/04/2015  . Hyponatremia 11/03/2015  . Bacterial folliculitis 02/58/5277  . Heart murmur 11/03/2015  . Protein-calorie malnutrition, moderate ([place]) 10/18/2014  . Left leg DVT ([place]) 10/18/2014  . DVT of upper extremity (deep vein thrombosis) ([place])   . Staphylococcus aureus bacteremia   . Cellulitis of left foot 10/13/2014  . Type 1 diabetes mellitus with hyperglycemia ([place]) 10/13/2014  . Chronic anemia 10/13/2014  . Hyperkalemia 10/06/2014  . Bilateral lower extremity edema 10/04/2014  . 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 Diabetic ulcer of left foot associated with type 1 diabetes mellitus ([place]) 09/26/2014  . Acute kidney injury superimposed on chronic kidney disease ([place]) 06/13/2014  . DM type 1 causing renal disease ([place]) 06/13/2013  . Osteomyelitis ([place]) 06/12/2013  . CKD (chronic kidney disease), stage III 06/12/2013  . UTI (urinary tract infection) 03/25/2013  . CAP (community acquired pneumonia) 03/25/2013  . Acute blood loss anemia 03/25/2013  . Acidosis 03/25/2013  . Leukocytosis, unspecified 03/25/2013  . Iron deficiency anemia 05/09/2011  . Charcot foot due to diabetes mellitus ([place]) 05/09/2011  . Foot ulcer due to secondary DM ([place]) 05/09/2011  . Type 1 diabetes mellitus with neurological manifestations, uncontrolled (  [place])   . Hypertension   . Hypercholesteremia     Past Surgical History:  Procedure Laterality Date  . AMPUTATION TOE Left 07/24/2015   5th toe and Hallux amputation  . CESAREAN SECTION    . EXOSTECTECTOMY TOE Left 04/11/2016   Procedure: EXOSTECTECTOMY CUBOID AND 5TH METATARSAL AND PLACEMENT OF ANTIBIOTIC BEADS;  Surgeon: Edrick Kins, DPM;  Location: [facility];  Service: Podiatry;  Laterality: Left;  . EYE SURGERY Bilateral    [place]  . FEMORAL-POPLITEAL BYPASS GRAFT Left 05/03/2015  . FOOT AMPUTATION THROUGH METATARSAL Right 2016  .  hemrrhoidectomy    . I&D EXTREMITY Right 06/10/2014   Procedure: IRRIGATION AND DEBRIDEMENT Right Foot;  Surgeon: Newt Minion, MD;  Location: [facility];  Service: Orthopedics;  Laterality: Right;  . INCISION AND DRAINAGE Left 04/11/2016   Procedure: INCISION AND DRAINAGE A DEEP COMPLICATED WOUND OF LEFT FOOT;  Surgeon: Edrick Kins, DPM;  Location: [facility];  Service: Podiatry;  Laterality: Left;  . PERIPHERALLY INSERTED CENTRAL CATHETER INSERTION    . SKIN GRAFT Right 06/15/2014  . SKIN GRAFT Left 06/2015   foot   . SKIN SPLIT GRAFT Right 06/15/2014   Procedure: SPLIT THICKNESS SKIN GRAFT RIGHT FOOT;  Surgeon: Newt Minion, MD;  Location: [facility];  Service: Orthopedics;  Laterality: Right;  . TRANSMETATARSAL AMPUTATION Right   . TRANSMETATARSAL AMPUTATION Right 09/11/2014    OB History    Gravida Para Term Preterm AB Living   1             SAB TAB Ectopic Multiple Live Births                   Home Medications    Prior to Admission medications   Medication Sig Start Date End Date Taking? Authorizing Provider  albuterol (PROVENTIL HFA;VENTOLIN HFA) 108 (90 BASE) MCG/ACT inhaler Inhale 2 puffs into the lungs every 6 (six) hours as needed for wheezing or shortness of breath. Reported on 04/15/2015    Historical Provider, MD  amLODipine (NORVASC) 2.5 MG tablet Take 1 tablet (2.5 mg total) by mouth daily. 05/15/16   Elayne Snare, MD  aspirin 325 MG tablet Take 325 mg by mouth daily.    Historical Provider, MD  calcitRIOL (ROCALTROL) 0.5 MCG capsule Take 1 capsule by mouth daily. 03/13/16   Historical Provider, MD  Calcium-Phosphorus-Vitamin D (CALCIUM GUMMIES) [phone] MG-MG-UNIT CHEW Chew 2 each by mouth daily.    Historical Provider, MD  CINNAMON PO Take 1 tablet by mouth daily.    Historical Provider, MD  Cyanocobalamin (VITAMIN B12 PO) Take 1 each by mouth daily.    Historical Provider, MD  folic acid (FOLVITE) 1 MG tablet Take 1 mg by mouth daily.    Historical Provider, MD  furosemide  (LASIX) 40 MG tablet Take 40 mg by mouth daily as needed for fluid.     Historical Provider, MD  glucose blood (BAYER CONTOUR NEXT TEST) test strip Use as instructed to check blood sugar 4 times per day dx code E10.65 12/07/15   Elayne Snare, MD  HUMALOG 100 UNIT/ML injection CALL MD IF <70, IF 151-200 =2UNITS,201-250 =4 UNITS,251-300 =6 UNITS, 301-350 =8 UNITS,351-400 =10 U 06/19/16   Elayne Snare, MD  Insulin Human (INSULIN PUMP) SOLN Inject into the skin. 15 carbs = 1 unit, every 50 sugars = 1 unit    Historical Provider, MD  Multiple Minerals-Vitamins (NUTRA-SUPPORT BONE) CAPS Take 1 each by mouth daily.    Historical Provider,  MD  Multiple Vitamins-Minerals (VISION PLUS) CAPS Take 1 each by mouth daily.    Historical Provider, MD  norethindrone (AYGESTIN) 5 MG tablet Take 1 tablet by mouth See admin instructions. Take 1 tablet daily for 14 days and off 14 days.Marland Kitchen    Historical Provider, MD  pravastatin (PRAVACHOL) 40 MG tablet TAKE 1 TABLET (40 MG TOTAL) BY MOUTH DAILY. 05/07/16   Elayne Snare, MD  pyridoxine (B-6) 100 MG tablet Take 100 mg by mouth daily. Reported on 04/15/2015    Historical Provider, MD  vitamin C (ASCORBIC ACID) 500 MG tablet Take 500 mg by mouth daily.    Historical Provider, MD  Vitamin D, Ergocalciferol, (DRISDOL) 50000 units CAPS capsule TAKE ONE CAPSULE BY MOUTH EVERY SUNDAY EVENING 05/28/16   Elayne Snare, MD    Family History Family History  Problem Relation Age of Onset  . Hyperlipidemia Mother   . Dementia Mother     Social History Social History  Substance Use Topics  . Smoking status: Former Smoker    Years: 15.00    Quit date: 05/16/2008  . Smokeless tobacco: Never Used  . Alcohol use No     Comment: Occasional     Allergies   Cleocin [clindamycin hcl]; Lisinopril; and Ciprofloxacin   Review of Systems Review of Systems  Constitutional: Negative for chills and fever.  HENT: Positive for facial swelling.   Respiratory: Negative for cough, chest tightness and  shortness of breath.   Cardiovascular: Negative for chest pain, palpitations and leg swelling.  Gastrointestinal: Negative for abdominal pain, diarrhea, nausea and vomiting.  Musculoskeletal: Negative for arthralgias, myalgias, neck pain and neck stiffness.  Skin: Positive for wound. Negative for rash.  Neurological: Negative for dizziness, weakness and headaches.  All other systems reviewed and are negative.    Physical Exam Updated Vital Signs BP (!) 141/85   Pulse 81   Temp 98.2 F (36.8 C)   Resp 16   Ht 5\' 7"  (1.702 m)   Wt 56.7 kg   LMP 06/10/2016   SpO2 100%   Breastfeeding? No   BMI 19.58 kg/m   Physical Exam  Constitutional: She is oriented to person, place, and time. She appears well-developed and well-nourished. No distress.  HENT:  Head: Normocephalic.  Erythema and swelling to the lower lip with central scabbing with yellow crusty scab. Diffuse ttp over the lip. Normal dentition.   Eyes: Conjunctivae are normal.  Neck: Neck supple.  Cardiovascular: Normal rate, regular rhythm and normal heart sounds.   Pulmonary/Chest: Effort normal and breath sounds normal. No respiratory distress. She has no wheezes. She has no rales.  Musculoskeletal: She exhibits no edema.  Neurological: She is alert and oriented to person, place, and time.  Skin: Skin is warm and dry.  Psychiatric: She has a normal mood and affect. Her behavior is normal.  Nursing note and vitals reviewed.    ED Treatments / Results  Labs (all labs ordered are listed, but only abnormal results are displayed) Labs Reviewed  COMPREHENSIVE METABOLIC PANEL - Abnormal; Notable for the following:       Result Value   Chloride 117 (*)    CO2 8 (*)    Glucose, Bld 167 (*)    BUN 50 (*)    Creatinine, Ser 3.03 (*)    Calcium 6.7 (*)    Total Protein 5.9 (*)    Albumin 2.4 (*)    Alkaline Phosphatase 208 (*)    GFR calc non Af Amer 18 (*)  GFR calc Af Amer 20 (*)    All other components within  normal limits  CBC WITH DIFFERENTIAL/PLATELET - Abnormal; Notable for the following:    Hemoglobin 11.6 (*)    Platelets 527 (*)    All other components within normal limits  CBG MONITORING, ED - Abnormal; Notable for the following:    Glucose-Capillary 170 (*)    All other components within normal limits    EKG  EKG Interpretation None       Radiology No results found.  Procedures Procedures (including critical care time)  INCISION AND DRAINAGE Performed by: Jeannett Senior A Consent: Verbal consent obtained. Risks and benefits: risks, benefits and alternatives were discussed Type: abscess  Body area: lower lip  Anesthesia: local infiltration  Incision was made with a scalpel.  Local anesthetic: lidocaine 2% wo epinephrine  Anesthetic total: 2 ml  Complexity: complex Blunt dissection to break up loculations  Drainage: none  Patient tolerance: Patient tolerated the procedure well with no immediate complications.    Medications Ordered in ED Medications  lidocaine (XYLOCAINE) 2 % (with pres) injection 200 mg (not administered)     Initial Impression / Assessment and Plan / ED Course  I have reviewed the triage vital signs and the nursing notes.  Pertinent labs & imaging results that were available during my care of the patient were reviewed by me and considered in my medical decision making (see chart for details).     A shunt in the emergency department with cellulitis and swelling. She is afebrile, nontoxic appearing. There is no surrounding cellulitis. I discussed patient with Dr. Wilburn Cornelia who advised [place] to do I and D continue antibiotics. Incision and drainage attempted with no purulent drainage. Discussed with Dr. Rex Kras who was inpatient as well. Blood work was significant electrolyte abnormalities and creatinine of 3, she will need close follow-up with family doctor. We will add Keflex to her Bactrim that she is on right now. I do not think she  needs admission given the worsening symptoms over the last 2 days while on Bactrim and normal vital signs. Advised to continue warm compresses, continue antibiotics, will add keflex, refer to Dr. Migdalia Dk. Discussed with Dr. Rex Kras who has seen pt and agreed.  Patient voiced understanding and agrees to the plan. Otherwise follow-up with family doctor in 2 days.  Vitals:   06/24/16 1315 06/24/16 1415 06/24/16 1445 06/24/16 1545  BP: 133/78 (!) 141/83 (!) 141/85 129/80  Pulse:  90 81 80  Resp:  18 16 18   Temp:      SpO2:  95% 100% 100%  Weight:      Height:         Final Clinical Impressions(s) / ED Diagnoses   Final diagnoses:  Cellulitis of skin of lip  Renal insufficiency    New Prescriptions New Prescriptions   CEPHALEXIN (KEFLEX) 500 MG CAPSULE    Take 1 capsule (500 mg total) by mouth 4 (four) times daily.   SULFAMETHOXAZOLE-TRIMETHOPRIM (BACTRIM DS,SEPTRA DS) 800-160 MG TABLET    Take 1 tablet by mouth 2 (two) times daily.     Jeannett Senior, PA-C 06/24/16 [place], MD 06/26/16 2026

## 2016-06-26 ENCOUNTER — Ambulatory Visit (INDEPENDENT_AMBULATORY_CARE_PROVIDER_SITE_OTHER): Payer: Medicare Other | Admitting: Endocrinology

## 2016-06-26 ENCOUNTER — Encounter: Payer: Self-pay | Admitting: Endocrinology

## 2016-06-26 VITALS — BP 114/68 | HR 90 | Ht 67.0 in | Wt 125.0 lb

## 2016-06-26 DIAGNOSIS — K3184 Gastroparesis: Secondary | ICD-10-CM | POA: Diagnosis not present

## 2016-06-26 DIAGNOSIS — E1043 Type 1 diabetes mellitus with diabetic autonomic (poly)neuropathy: Secondary | ICD-10-CM | POA: Diagnosis not present

## 2016-06-26 DIAGNOSIS — E1065 Type 1 diabetes mellitus with hyperglycemia: Secondary | ICD-10-CM

## 2016-06-26 DIAGNOSIS — I70245 Atherosclerosis of native arteries of left leg with ulceration of other part of foot: Secondary | ICD-10-CM

## 2016-06-26 MED ORDER — METOCLOPRAMIDE HCL 5 MG PO TABS: 5.0000 mg | ORAL_TABLET | Freq: Three times a day (TID) | ORAL | 1 refills | Status: DC

## 2016-06-26 NOTE — Progress Notes (Addendum)
Patient ID: [name], female   DOB: 25-Jul-1970, 46 y.o.   MRN: [id number]   Reason for Appointment: follow-up of diabetes  History of Present Illness   Diagnosis: Type 1 DIABETES MELITUS  Previous history: She has had long-standing poorly controlled diabetes and typically has poor compliance with self care measures despite periodic diabetes education and periodic followup  INSULIN PUMP regimen:   BASAL rate 0.35 at midnight, 0.45 at 12 noon and 0.35 at 3:30 PM  Carb coverage 1: 17 g and correction 1: 55 with target 120, active insulin 6 hours         Recent history:   Her last A1c was 11%  Current blood sugar patterns, difficulties with management and problems identified:  She had an increase in her basal rates done on her last visit because of persistent her blood sugars  However this month she has been having increasing problems with nausea, vomiting and diarrhea and her intake has decreased  This was causing low blood sugars including about 3 visits by the EMS  She was seen by the nurse educator last Thursday and her basal rates were reduced significantly: However she did again have a low sugar that afternoon  trips to the  However her blood sugars are appearing to be higher  She is again not motivated to take care of her diabetes in anyway with checking her blood sugars, monitoring before eating, bolusing on time or watching her diet.  She is eating very high carbohydrate meals and drinking a lot of milk or juice without much protein  She has a couple of days without any boluses  She thinks she is trying to bolus mostly after eating when the blood sugar goes up  Also changing her infusion set every 5-7 days now instead of every 3-4 days  She is using a Generic monitor as  says she lost her meter and is checking sugars 4-5x daily now    Blood sugars are usually markedly increased later at night probably after eating and drinking but are still  relatively high when she first checks her blood sugar before noon   Blood sugars are reviewed from her home record checking about 5 times a day on average  PRE-MEAL Fasting Lunch Dinner Overnight  Overall  Glucose range: 130-574  79-235  106-361  63, 284    Mean/median:        POST-MEAL PC Breakfast PC Lunch Late evening   Glucose range:   178-353   Mean/median:       Meals: eating at variable times and also having snacks periodically.  Her daily carbohydrate intake is quite variable Physical activity: exercise: none         Dietician visit: Most recent:?          Complications: are: Neuropathy, nephropathy, diabetic foot ulcer      Wt Readings from Last 3 Encounters:  06/26/16 125 lb (56.7 kg)  06/24/16 125 lb (56.7 kg)  06/09/16 130 lb (59 kg)   Lab Results  Component Value Date   HGBA1C 11.0 (H) 05/02/2016   HGBA1C 12.0 02/22/2016   HGBA1C 8.4 (H) 11/04/2015   Lab Results  Component Value Date   MICROALBUR 89.4 (H) 12/02/2015   LDLCALC 78 05/02/2016   CREATININE 3.03 (H) 06/24/2016    Other problems addressed:: See review of systems  Allergies as of 06/26/2016      Reactions   Cleocin [clindamycin Hcl] Diarrhea   Diarrhea  Lisinopril Other (See Comments)   Elevated potassium per pt report   Ciprofloxacin Itching      Medication List       Accurate as of 06/26/16  9:11 PM. Always use your most recent med list.          albuterol 108 (90 Base) MCG/ACT inhaler Commonly known as:  PROVENTIL HFA;VENTOLIN HFA Inhale 2 puffs into the lungs every 6 (six) hours as needed for wheezing or shortness of breath. Reported on 04/15/2015   amLODipine 2.5 MG tablet Commonly known as:  NORVASC Take 1 tablet (2.5 mg total) by mouth daily.   aspirin 325 MG tablet Take 325 mg by mouth daily.   calcitRIOL 0.5 MCG capsule Commonly known as:  ROCALTROL Take 1 capsule by mouth daily.   CALCIUM GUMMIES 250-100-500 MG-MG-UNIT Chew Generic drug:   Calcium-Phosphorus-Vitamin D Chew 2 each by mouth daily.   cephALEXin 500 MG capsule Commonly known as:  KEFLEX Take 1 capsule (500 mg total) by mouth 4 (four) times daily.   CINNAMON PO Take 1 tablet by mouth daily.   folic acid 1 MG tablet Commonly known as:  FOLVITE Take 1 mg by mouth daily.   furosemide 40 MG tablet Commonly known as:  LASIX Take 40 mg by mouth daily as needed for fluid.   glucose blood test strip Commonly known as:  BAYER CONTOUR NEXT TEST Use as instructed to check blood sugar 4 times per day dx code E10.65   HUMALOG 100 UNIT/ML injection Generic drug:  insulin lispro CALL MD IF <70, IF 151-200 =2UNITS,201-250 =4 UNITS,251-300 =6 UNITS, 301-350 =8 UNITS,351-400 =10 U   insulin pump Soln Inject into the skin. 15 carbs = 1 unit, every 50 sugars = 1 unit   metoCLOPramide 5 MG tablet Commonly known as:  REGLAN Take 1 tablet (5 mg total) by mouth 3 (three) times daily before meals.   norethindrone 5 MG tablet Commonly known as:  AYGESTIN Take 1 tablet by mouth See admin instructions. Take 1 tablet daily for 14 days and off 14 days.Philbert Riser BONE Caps Take 1 each by mouth daily.   ondansetron 4 MG disintegrating tablet Commonly known as:  ZOFRAN-ODT DISSOLVE 1 TABLET ON TONGUE EVERY 8 HOURS AS NEEDED FOR NAUSEA   pravastatin 40 MG tablet Commonly known as:  PRAVACHOL TAKE 1 TABLET (40 MG TOTAL) BY MOUTH DAILY.   pyridoxine 100 MG tablet Commonly known as:  B-6 Take 100 mg by mouth daily. Reported on 04/15/2015   sulfamethoxazole-trimethoprim 800-160 MG tablet Commonly known as:  BACTRIM DS,SEPTRA DS Take 1 tablet by mouth 2 (two) times daily.   VISION PLUS Caps Take 1 each by mouth daily.   VITAMIN B12 PO Take 1 each by mouth daily.   vitamin C 500 MG tablet Commonly known as:  ASCORBIC ACID Take 500 mg by mouth daily.   Vitamin D (Ergocalciferol) 50000 units Caps capsule Commonly known as:  DRISDOL TAKE ONE CAPSULE BY MOUTH  EVERY SUNDAY EVENING       Allergies:  Allergies  Allergen Reactions  . Cleocin [Clindamycin Hcl] Diarrhea    Diarrhea   . Lisinopril Other (See Comments)    Elevated potassium per pt report  . Ciprofloxacin Itching    Past Medical History:  Diagnosis Date  . Anemia   . C. difficile diarrhea 09/26/2014  . Cataracts, both eyes   . Cellulitis of right foot 06/02/2014  . CKD (chronic kidney disease) stage 3, GFR 30-59 ml/min  sees Dr Justin Mend- Stage IV  . Diabetic ulcer of right foot ([place]) 06/02/2014  . DVT (deep venous thrombosis) ([place]) 10/2014   one in each one in leg- PICC line   . H/O seasonal allergies   . Heart murmur    told once when she was pregnant- no furter mention- was in notes 11/2015- had Echo 8 /4/17  . History of blood transfusion   . Hypercholesteremia   . Hypertension   . Left foot infection   . Neuropathy ([facility])    feet  . Neuropathy in diabetes ([place])   . Peripheral vascular disease ([place])   . Staphylococcus aureus bacteremia 10/2014  . Type 1 diabetes ([place])    onset age 68    Past Surgical History:  Procedure Laterality Date  . AMPUTATION TOE Left 07/24/2015   5th toe and Hallux amputation  . CESAREAN SECTION    . EXOSTECTECTOMY TOE Left 04/11/2016   Procedure: EXOSTECTECTOMY CUBOID AND 5TH METATARSAL AND PLACEMENT OF ANTIBIOTIC BEADS;  Surgeon: Edrick Kins, DPM;  Location: [facility];  Service: Podiatry;  Laterality: Left;  . EYE SURGERY Bilateral    [place]  . FEMORAL-POPLITEAL BYPASS GRAFT Left 05/03/2015  . FOOT AMPUTATION THROUGH METATARSAL Right 2016  . hemrrhoidectomy    . I&D EXTREMITY Right 06/10/2014   Procedure: IRRIGATION AND DEBRIDEMENT Right Foot;  Surgeon: Newt Minion, MD;  Location: [facility];  Service: Orthopedics;  Laterality: Right;  . INCISION AND DRAINAGE Left 04/11/2016   Procedure: INCISION AND DRAINAGE A DEEP COMPLICATED WOUND OF LEFT FOOT;  Surgeon: Edrick Kins, DPM;  Location: [facility];  Service: Podiatry;  Laterality: Left;  .  PERIPHERALLY INSERTED CENTRAL CATHETER INSERTION    . SKIN GRAFT Right 06/15/2014  . SKIN GRAFT Left 06/2015   foot   . SKIN SPLIT GRAFT Right 06/15/2014   Procedure: SPLIT THICKNESS SKIN GRAFT RIGHT FOOT;  Surgeon: Newt Minion, MD;  Location: [facility];  Service: Orthopedics;  Laterality: Right;  . TRANSMETATARSAL AMPUTATION Right   . TRANSMETATARSAL AMPUTATION Right 09/11/2014    Family History  Problem Relation Age of Onset  . Hyperlipidemia Mother   . Dementia Mother     Social History:  reports that she quit smoking about 8 years ago. She quit after 15.00 years of use. She has never used smokeless tobacco. She reports that she does not drink alcohol or use drugs.  Review of Systems:  Nausea and vomiting and diarrhea:  She is having mostly nausea and vomiting recently and is not able to keep some of her meals down She has been taking Zofran without relief including sublingual  She has less diarrhea recently, has not needed treatment currently  Hypertension/CKD:    Hypertension better controlled with amlodipine 2.5 mg Followed by nephrologist, Does not have an upcoming appointment   Her creatinine was much higher when she went to the emergency room although previously about 2.1 Also has significantly decreased CO2 levels but did not hyperkalemia currently   Lab Results  Component Value Date   CREATININE 3.03 (H) 06/24/2016   BUN 50 (H) 06/24/2016   NA 139 06/24/2016   K 4.2 06/24/2016   CL 117 (H) 06/24/2016   CO2 8 (L) 06/24/2016     Lipids: Has been  prescribed pravastatin, She thinks she is taking this regularly  LDL controlled   Lab Results  Component Value Date   CHOL 139 05/02/2016   HDL 26.30 (L) 05/02/2016   LDLCALC 78 05/02/2016  LDLDIRECT 112.0 07/05/2014   TRIG 173.0 (H) 05/02/2016   CHOLHDL 5 05/02/2016       She has been treated by nephrologist for anemia and has required iron infusion    Lab Results  Component Value Date   WBC 6.6  06/24/2016   HGB 11.6 (L) 06/24/2016   HCT 36.2 06/24/2016   MCV 88.5 06/24/2016   PLT 527 (H) 06/24/2016    She has a Charcot foot And has had amputation of Left fifth toe which was infected She is now going to the wound care center for her left foot, infection appears to be improved  She has vitamin D deficiency and was given 50,000 units weekly   Examination:   BP 114/68   Pulse 90   Ht 5\' 7"  (1.702 m)   Wt 125 lb (56.7 kg)   LMP 06/10/2016   SpO2 97%   BMI 19.58 kg/m   Body mass index is 19.58 kg/m.     ASSESSMENT/ PLAN:   Diabetes type 1 with poor control and multiple complications    Her diabetes is persistently poorly controlled Partly from noncompliance with all measures of self-care A1c is 11% as of 1/18  See history of present illness for details of her blood sugar patterns, current management and problems identified  More recently however she has had variable blood sugar patterns With using a Generic monitor not clear if her readings are accurate but they are much lower this month, most likely because of her significant GI problems and decrease intake overall  She has had severe hypoglycemia about 3 times this month, including once after reducing her basal rate last week However lowest blood sugar in the last few days has been 63 at 3 AM  DIABETIC nephropathy with nephrotic syndrome: Her last creatinine was significantly higher and she has low CO2 Recommended urgent follow-up with nephrologist  Nausea and vomiting not relieved by Zofran: May have gastroparesis, currently untreated and has not discussed with PCP  PLAN:    Basal rate 0.3 at midnight until 4 AM and then 0.425 until 3:30 PM   No change in carbohydrate ratio, reassess bolus settings on the next visit including active insulin  She will be seen by nurse educator today and she will try to get a Contour link glucose meter  Reminded her not to suspend pump for long periods of time unless needed  for low sugars  Discussed that if she is able to document checking blood sugars 4 times a day she can get the continuous glucose monitoring from [employer]  Trial of REGLAN 5 mg up to twice a day for probable gastroparesis and follow-up with PCP, may need gastroenterology consultation  She needs to call the nephrologist for follow-up   Counseling time on subjects discussed above is over 50% of today's 25 minute visit  Patient Instructions  Start using the contour link meter Please bring your meter also when you come in for your follow-up Check blood sugars at least 4 times a day Do not suspend the pump unless blood sugar is low or low normal  Try using METOCLOPRAMIDE 5 mg tablet 30 minutes before meals, do not exceed 2 tablets per day, may cause shakiness or dizziness  Follow-up with kidney doctor/nephrologist as soon as possible  Follow-up with your primary care doctor for stomach issues    Thelton Graca 06/26/2016, 9:11 PM

## 2016-06-26 NOTE — Patient Instructions (Signed)
Start using the contour link meter Please bring your meter also when you come in for your follow-up Check blood sugars at least 4 times a day Do not suspend the pump unless blood sugar is low or low normal  Try using METOCLOPRAMIDE 5 mg tablet 30 minutes before meals, do not exceed 2 tablets per day, may cause shakiness or dizziness  Follow-up with kidney doctor/nephrologist as soon as possible  Follow-up with your primary care doctor for stomach issues

## 2016-06-27 DIAGNOSIS — E10621 Type 1 diabetes mellitus with foot ulcer: Secondary | ICD-10-CM | POA: Diagnosis not present

## 2016-06-28 ENCOUNTER — Emergency Department (HOSPITAL_BASED_OUTPATIENT_CLINIC_OR_DEPARTMENT_OTHER)
Admission: EM | Admit: 2016-06-28 | Discharge: 2016-06-28 | Disposition: A | Discharge status: Home / Self Care | Payer: Medicare Other | Attending: Emergency Medicine | Admitting: Emergency Medicine

## 2016-06-28 ENCOUNTER — Encounter (HOSPITAL_BASED_OUTPATIENT_CLINIC_OR_DEPARTMENT_OTHER): Payer: Self-pay

## 2016-06-28 DIAGNOSIS — E1022 Type 1 diabetes mellitus with diabetic chronic kidney disease: Secondary | ICD-10-CM | POA: Diagnosis not present

## 2016-06-28 DIAGNOSIS — Z79899 Other long term (current) drug therapy: Secondary | ICD-10-CM | POA: Diagnosis not present

## 2016-06-28 DIAGNOSIS — N183 Chronic kidney disease, stage 3 (moderate): Secondary | ICD-10-CM | POA: Diagnosis not present

## 2016-06-28 DIAGNOSIS — R112 Nausea with vomiting, unspecified: Secondary | ICD-10-CM | POA: Diagnosis not present

## 2016-06-28 DIAGNOSIS — Z87891 Personal history of nicotine dependence: Secondary | ICD-10-CM | POA: Diagnosis not present

## 2016-06-28 DIAGNOSIS — R197 Diarrhea, unspecified: Secondary | ICD-10-CM | POA: Insufficient documentation

## 2016-06-28 DIAGNOSIS — Z7982 Long term (current) use of aspirin: Secondary | ICD-10-CM | POA: Diagnosis not present

## 2016-06-28 DIAGNOSIS — I129 Hypertensive chronic kidney disease with stage 1 through stage 4 chronic kidney disease, or unspecified chronic kidney disease: Secondary | ICD-10-CM | POA: Insufficient documentation

## 2016-06-28 DIAGNOSIS — R109 Unspecified abdominal pain: Secondary | ICD-10-CM | POA: Diagnosis not present

## 2016-06-28 DIAGNOSIS — Z794 Long term (current) use of insulin: Secondary | ICD-10-CM | POA: Diagnosis not present

## 2016-06-28 LAB — CBC WITH DIFFERENTIAL/PLATELET
BASOS PCT: 0 %
Basophils Absolute: 0 10*3/uL (ref 0.0–0.1)
EOS ABS: 0.2 10*3/uL (ref 0.0–0.7)
Eosinophils Relative: 4 %
HEMATOCRIT: 37.8 % (ref 36.0–46.0)
HEMOGLOBIN: 13.2 g/dL (ref 12.0–15.0)
Lymphocytes Relative: 28 %
Lymphs Abs: 1.4 10*3/uL (ref 0.7–4.0)
MCH: 29.3 pg (ref 26.0–34.0)
MCHC: 34.9 g/dL (ref 30.0–36.0)
MCV: 83.8 fL (ref 78.0–100.0)
Monocytes Absolute: 0.4 10*3/uL (ref 0.1–1.0)
Monocytes Relative: 9 %
NEUTROS ABS: 2.9 10*3/uL (ref 1.7–7.7)
NEUTROS PCT: 59 %
Platelets: 503 10*3/uL — ABNORMAL HIGH (ref 150–400)
RBC: 4.51 MIL/uL (ref 3.87–5.11)
RDW: 14.1 % (ref 11.5–15.5)
WBC: 4.9 10*3/uL (ref 4.0–10.5)

## 2016-06-28 LAB — COMPREHENSIVE METABOLIC PANEL
ALBUMIN: 2.6 g/dL — AB (ref 3.5–5.0)
ALK PHOS: 283 U/L — AB (ref 38–126)
ALT: 63 U/L — AB (ref 14–54)
ANION GAP: 14 (ref 5–15)
AST: 72 U/L — AB (ref 15–41)
BILIRUBIN TOTAL: 0.6 mg/dL (ref 0.3–1.2)
BUN: 46 mg/dL — AB (ref 6–20)
CHLORIDE: 113 mmol/L — AB (ref 101–111)
CO2: 9 mmol/L — AB (ref 22–32)
Calcium: 7.2 mg/dL — ABNORMAL LOW (ref 8.9–10.3)
Creatinine, Ser: 3.2 mg/dL — ABNORMAL HIGH (ref 0.44–1.00)
GFR calc Af Amer: 19 mL/min — ABNORMAL LOW (ref >60)
GFR calc non Af Amer: 17 mL/min — ABNORMAL LOW (ref >60)
GLUCOSE: 339 mg/dL — AB (ref 65–99)
POTASSIUM: 4.6 mmol/L (ref 3.5–5.1)
SODIUM: 136 mmol/L (ref 135–145)
TOTAL PROTEIN: 6.6 g/dL (ref 6.5–8.1)

## 2016-06-28 LAB — C DIFFICILE QUICK SCREEN W PCR REFLEX
C DIFFICILE (CDIFF) INTERP: NOT DETECTED
C DIFFICILE (CDIFF) TOXIN: NEGATIVE
C Diff antigen: NEGATIVE

## 2016-06-28 LAB — OCCULT BLOOD X 1 CARD TO LAB, STOOL: FECAL OCCULT BLD: NEGATIVE

## 2016-06-28 LAB — LIPASE, BLOOD: Lipase: 39 U/L (ref 11–51)

## 2016-06-28 MED ORDER — METRONIDAZOLE 500 MG PO TABS: 500.0000 mg | ORAL_TABLET | Freq: Three times a day (TID) | ORAL | 0 refills | Status: DC

## 2016-06-28 MED ORDER — SODIUM CHLORIDE 0.9 % IV BOLUS (SEPSIS)
1000.0000 mL | Freq: Once | INTRAVENOUS | Status: AC
  Administered 2016-06-28: 1000 mL via INTRAVENOUS

## 2016-06-28 NOTE — Discharge Instructions (Signed)
Please call your primary care provider tomorrow morning to schedule a follow-up appointment. Please take all of your antibiotics until finished!  Return to ER for new or worsening symptoms, any additional concerns.

## 2016-06-28 NOTE — ED Notes (Signed)
Patient complains of bright red rectal bleeding x2 days. Pt reports she had a hypoglycemia episode about a month ago and has had N/V/D ever since. Pt reports her insulin dosage was changed Monday. Pt has been on Cephalexin since 06/24/2016 and sulfamethoxazole since 06/18/2016.

## 2016-06-28 NOTE — ED Notes (Signed)
Pt comfortable, denies any pain at this time, requesting food

## 2016-06-28 NOTE — ED Notes (Signed)
Attempt x 1 right hand for IV start unsuccessful. Blood obtained and sent to lab

## 2016-06-28 NOTE — ED Notes (Signed)
ED Provider at bedside. 

## 2016-06-28 NOTE — ED Notes (Signed)
Pt has tried taking zofran and pepto-bismol with no relief, has not taken any today.

## 2016-06-28 NOTE — ED Notes (Signed)
Pt states she wants to be tested for C-Diff

## 2016-06-28 NOTE — ED Provider Notes (Signed)
[facility] DEPT [facility] Provider Note   CSN: [id number] Arrival date & time: 06/28/16  1713  By signing my name below, I, [name], attest that this documentation has been prepared under the direction and in the presence of non-physician practitioner, Ozella Almond Ward, PA-C. Electronically Signed: Jeanell [name], Scribe. 06/28/2016. 6:07 PM.  History   Chief Complaint Chief Complaint  Patient presents with  . Diarrhea   The history is provided by the patient. No language interpreter was used.   HPI Comments: [name] is a 46 y.o. female with a PMHx of C. Difficile in 2016 who presents to the Emergency Department complaining of diarrhea for the last month. Initially non-bloody, but noticed blood in stool 2 days ago. She has been having associated nausea and intermittent vomiting for the past month. For past 2 weeks, she had non-stop liquid green stool. She has been on antibiotics for the past 2 weeks for abscess to the lip (doxycyline, then bactrim/keflex). She just got off her antibiotics prior to her symptom onset a months ago for abscess to left foot.  She further reports associated abdominal pain (mild until exacerbated by eating). She denies any fever, cough, congestion, back pain, urinary symptoms.    PCP: [facility]  Past Medical History:  Diagnosis Date  . Anemia   . C. difficile diarrhea 09/26/2014  . Cataracts, both eyes   . Cellulitis of right foot 06/02/2014  . CKD (chronic kidney disease) stage 3, GFR 30-59 ml/min    sees Dr Justin Mend- Stage IV  . Diabetic ulcer of right foot ([place]) 06/02/2014  . DVT (deep venous thrombosis) ([place]) 10/2014   one in each one in leg- PICC line   . H/O seasonal allergies   . Heart murmur    told once when she was pregnant- no furter mention- was in notes 11/2015- had Echo 8 /4/17  . History of blood transfusion   . Hypercholesteremia   . Hypertension   . Left foot infection   . Neuropathy (HCC)    feet  .  Neuropathy in diabetes ([place])   . Peripheral vascular disease ([place])   . Staphylococcus aureus bacteremia 10/2014  . Type 1 diabetes [facility])    onset age 89    Patient Active Problem List   Diagnosis Date Noted  . Boil of scalp 11/04/2015  . Boil of left cheek 11/04/2015  . Boil of lower abdomen 11/04/2015  . MRSA cellulitis 11/04/2015  . Hyponatremia 11/03/2015  . Bacterial folliculitis 86/76/7209  . Heart murmur 11/03/2015  . Protein-calorie malnutrition, moderate ([place]) 10/18/2014  . Left leg DVT ([place]) 10/18/2014  . DVT of upper extremity (deep vein thrombosis) ([place])   . Staphylococcus aureus bacteremia   . Cellulitis of left foot 10/13/2014  . Type 1 diabetes mellitus with hyperglycemia ([place]) 10/13/2014  . Chronic anemia 10/13/2014  . Hyperkalemia 10/06/2014  . Bilateral lower extremity edema 10/04/2014  . Status post transmetatarsal amputation of right foot ([place]) 09/26/2014  . Diabetic ulcer of left foot associated with type 1 diabetes mellitus ([place]) 09/26/2014  . Acute kidney injury superimposed on chronic kidney disease ([place]) 06/13/2014  . DM type 1 causing renal disease ([place]) 06/13/2013  . Osteomyelitis ([place]) 06/12/2013  . CKD (chronic kidney disease), stage III 06/12/2013  . UTI (urinary tract infection) 03/25/2013  . CAP (community acquired pneumonia) 03/25/2013  . Acute blood loss anemia 03/25/2013  . Acidosis 03/25/2013  . Leukocytosis, unspecified 03/25/2013  . Iron deficiency anemia 05/09/2011  .  Charcot foot due to diabetes mellitus ([place]) 05/09/2011  . Foot ulcer due to secondary DM ([place]) 05/09/2011  . Type 1 diabetes mellitus with neurological manifestations, uncontrolled ([place])   . Hypertension   . Hypercholesteremia     Past Surgical History:  Procedure Laterality Date  . AMPUTATION TOE Left 07/24/2015   5th toe and Hallux amputation  . CESAREAN SECTION    . EXOSTECTECTOMY TOE Left 04/11/2016   Procedure: EXOSTECTECTOMY CUBOID AND 5TH METATARSAL AND  PLACEMENT OF ANTIBIOTIC BEADS;  Surgeon: Edrick Kins, DPM;  Location: [facility];  Service: Podiatry;  Laterality: Left;  . EYE SURGERY Bilateral    [place]  . FEMORAL-POPLITEAL BYPASS GRAFT Left 05/03/2015  . FOOT AMPUTATION THROUGH METATARSAL Right 2016  . hemrrhoidectomy    . I&D EXTREMITY Right 06/10/2014   Procedure: IRRIGATION AND DEBRIDEMENT Right Foot;  Surgeon: Newt Minion, MD;  Location: [facility];  Service: Orthopedics;  Laterality: Right;  . INCISION AND DRAINAGE Left 04/11/2016   Procedure: INCISION AND DRAINAGE A DEEP COMPLICATED WOUND OF LEFT FOOT;  Surgeon: Edrick Kins, DPM;  Location: [facility];  Service: Podiatry;  Laterality: Left;  . PERIPHERALLY INSERTED CENTRAL CATHETER INSERTION    . SKIN GRAFT Right 06/15/2014  . SKIN GRAFT Left 06/2015   foot   . SKIN SPLIT GRAFT Right 06/15/2014   Procedure: SPLIT THICKNESS SKIN GRAFT RIGHT FOOT;  Surgeon: Newt Minion, MD;  Location: [facility];  Service: Orthopedics;  Laterality: Right;  . TRANSMETATARSAL AMPUTATION Right   . TRANSMETATARSAL AMPUTATION Right 09/11/2014    OB History    Gravida Para Term Preterm AB Living   1             SAB TAB Ectopic Multiple Live Births                   Home Medications    Prior to Admission medications   Medication Sig Start Date End Date Taking? Authorizing Provider  albuterol (PROVENTIL HFA;VENTOLIN HFA) 108 (90 BASE) MCG/ACT inhaler Inhale 2 puffs into the lungs every 6 (six) hours as needed for wheezing or shortness of breath. Reported on 04/15/2015    Historical Provider, MD  amLODipine (NORVASC) 2.5 MG tablet Take 1 tablet (2.5 mg total) by mouth daily. 05/15/16   Elayne Snare, MD  aspirin 325 MG tablet Take 325 mg by mouth daily.    Historical Provider, MD  calcitRIOL (ROCALTROL) 0.5 MCG capsule Take 1 capsule by mouth daily. 03/13/16   Historical Provider, MD  Calcium-Phosphorus-Vitamin D (CALCIUM GUMMIES) [phone] MG-MG-UNIT CHEW Chew 2 each by mouth daily.    Historical Provider, MD    cephALEXin (KEFLEX) 500 MG capsule Take 1 capsule (500 mg total) by mouth 4 (four) times daily. 06/24/16   Tatyana Kirichenko, PA-C  CINNAMON PO Take 1 tablet by mouth daily.    Historical Provider, MD  Cyanocobalamin (VITAMIN B12 PO) Take 1 each by mouth daily.    Historical Provider, MD  folic acid (FOLVITE) 1 MG tablet Take 1 mg by mouth daily.    Historical Provider, MD  furosemide (LASIX) 40 MG tablet Take 40 mg by mouth daily as needed for fluid.     Historical Provider, MD  glucose blood (BAYER CONTOUR NEXT TEST) test strip Use as instructed to check blood sugar 4 times per day dx code E10.65 12/07/15   Elayne Snare, MD  HUMALOG 100 UNIT/ML injection CALL MD IF <70, IF 151-200 =2UNITS,201-250 =4 UNITS,251-300 =6 UNITS, 301-350 =8  UDJSH,702-637 =10 U 06/19/16   Elayne Snare, MD  Insulin Human (INSULIN PUMP) SOLN Inject into the skin. 15 carbs = 1 unit, every 50 sugars = 1 unit    Historical Provider, MD  metoCLOPramide (REGLAN) 5 MG tablet Take 1 tablet (5 mg total) by mouth 3 (three) times daily before meals. 06/26/16   Elayne Snare, MD  metroNIDAZOLE (FLAGYL) 500 MG tablet Take 1 tablet (500 mg total) by mouth 3 (three) times daily. 06/28/16   Ozella Almond Ward, PA-C  Multiple Minerals-Vitamins (NUTRA-SUPPORT BONE) CAPS Take 1 each by mouth daily.    Historical Provider, MD  Multiple Vitamins-Minerals (VISION PLUS) CAPS Take 1 each by mouth daily.    Historical Provider, MD  norethindrone (AYGESTIN) 5 MG tablet Take 1 tablet by mouth See admin instructions. Take 1 tablet daily for 14 days and off 14 days.Marland Kitchen    Historical Provider, MD  ondansetron (ZOFRAN-ODT) 4 MG disintegrating tablet DISSOLVE 1 TABLET ON TONGUE EVERY 8 HOURS AS NEEDED FOR NAUSEA 04/23/16   Historical Provider, MD  pravastatin (PRAVACHOL) 40 MG tablet TAKE 1 TABLET (40 MG TOTAL) BY MOUTH DAILY. 05/07/16   Elayne Snare, MD  pyridoxine (B-6) 100 MG tablet Take 100 mg by mouth daily. Reported on 04/15/2015    Historical Provider, MD   sulfamethoxazole-trimethoprim (BACTRIM DS,SEPTRA DS) 800-160 MG tablet Take 1 tablet by mouth 2 (two) times daily. 06/24/16 07/01/16  Tatyana Kirichenko, PA-C  vitamin C (ASCORBIC ACID) 500 MG tablet Take 500 mg by mouth daily.    Historical Provider, MD  Vitamin D, Ergocalciferol, (DRISDOL) 50000 units CAPS capsule TAKE ONE CAPSULE BY MOUTH EVERY SUNDAY EVENING 05/28/16   Elayne Snare, MD    Family History Family History  Problem Relation Age of Onset  . Hyperlipidemia Mother   . Dementia Mother     Social History Social History  Substance Use Topics  . Smoking status: Former Smoker    Years: 15.00    Quit date: 05/16/2008  . Smokeless tobacco: Never Used  . Alcohol use 0.5 oz/week    1 Standard drinks or equivalent per week     Comment: Occasional     Allergies   Cleocin [clindamycin hcl]; Lisinopril; and Ciprofloxacin   Review of Systems Review of Systems  Constitutional: Negative for fever.  Respiratory: Negative for cough.   Gastrointestinal: Positive for blood in stool, diarrhea, nausea and vomiting.  All other systems reviewed and are negative.    Physical Exam Updated Vital Signs BP 110/80 (BP Location: Left Arm)   Pulse 88   Temp 98.3 F (36.8 C) (Oral)   Resp 16   Ht 5\' 7"  (1.702 m)   Wt 128 lb (58.1 kg)   LMP 06/10/2016   SpO2 100%   BMI 20.05 kg/m   Physical Exam  Constitutional: She is oriented to person, place, and time. She appears well-developed and well-nourished. No distress.  Non-toxic appearing.   HENT:  Head: Normocephalic and atraumatic.  Cardiovascular: Normal rate, regular rhythm and normal heart sounds.   No murmur heard. Pulmonary/Chest: Effort normal and breath sounds normal. No respiratory distress.  Abdominal: Soft. Bowel sounds are normal. She exhibits no distension.  No abdominal or CVA tenderness.  Musculoskeletal: She exhibits no edema.  Neurological: She is alert and oriented to person, place, and time.  Skin: Skin is warm  and dry.  Nursing note and vitals reviewed.    ED Treatments / Results  DIAGNOSTIC STUDIES: Oxygen Saturation is 100% on RA, normal by my  interpretation.    COORDINATION OF CARE: 6:11 PM- Pt advised of plan for treatment and pt agrees.  Labs (all labs ordered are listed, but only abnormal results are displayed) Labs Reviewed  CBC WITH DIFFERENTIAL/PLATELET - Abnormal; Notable for the following:       Result Value   Platelets 503 (*)    All other components within normal limits  COMPREHENSIVE METABOLIC PANEL - Abnormal; Notable for the following:    Chloride 113 (*)    CO2 9 (*)    Glucose, Bld 339 (*)    BUN 46 (*)    Creatinine, Ser 3.20 (*)    Calcium 7.2 (*)    Albumin 2.6 (*)    AST 72 (*)    ALT 63 (*)    Alkaline Phosphatase 283 (*)    GFR calc non Af Amer 17 (*)    GFR calc Af Amer 19 (*)    All other components within normal limits  GASTROINTESTINAL PANEL BY PCR, STOOL (REPLACES STOOL CULTURE)  C DIFFICILE QUICK SCREEN W PCR REFLEX  LIPASE, BLOOD  OCCULT BLOOD X 1 CARD TO LAB, STOOL  URINALYSIS, ROUTINE W REFLEX MICROSCOPIC    EKG  EKG Interpretation None       Radiology No results found.  Procedures Procedures (including critical care time)  Medications Ordered in ED Medications  sodium chloride 0.9 % bolus 1,000 mL (1,000 mLs Intravenous New Bag/Given 06/28/16 1930)     Initial Impression / Assessment and Plan / ED Course  I have reviewed the triage vital signs and the nursing notes.  Pertinent labs & imaging results that were available during my care of the patient were reviewed by me and considered in my medical decision making (see chart for details).    CONITA AMENTA is a 46 y.o. female who presents to ED for Diarrhea 1 month, with blood noted over the last 2 days. She has been on multiple antibiotics over the last month. Patient does have a history of C. difficile and states that her symptoms seem very similar. On exam, patient  has no abdominal or CVA tenderness. She is afebrile, nontoxic appearing and hemodynamically stable. CBC reassuringly normal white count and he chained each. CMP reviewed with attending, Dr. Stark Jock- She does have slight elevations in liver labs, but otherwise appears baseline. Hemoccult sample is negative. GI panel and C. difficile PCR obtained. Given the length diarrhea along with a history of C. difficile and been on multiple antibiotics recently, will start patient on Flagyl 3 times a day for possible C. difficile. Discussed risks and benefits of this with patient who agrees on starting treatment now. She agrees to follow up with PCP early next week for recheck. Return precautions discussed. Patient feels better after fluids. All questions answered.   Patient discussed with Dr. Stark Jock who agrees with treatment plan.   Final Clinical Impressions(s) / ED Diagnoses   Final diagnoses:  Diarrhea, unspecified type    New Prescriptions New Prescriptions   METRONIDAZOLE (FLAGYL) 500 MG TABLET    Take 1 tablet (500 mg total) by mouth 3 (three) times daily.   I personally performed the services described in this documentation, which was scribed in my presence. The recorded information has been reviewed and is accurate.     [facility] Ward, PA-C 06/28/16 2012    Veryl Speak, MD 06/28/16 2115

## 2016-06-28 NOTE — ED Notes (Signed)
IV attempted in Left Anti site, with ultrasound, blood return noted, but unable to thread IV catheter

## 2016-06-28 NOTE — ED Triage Notes (Signed)
c/o n/v/d x 2 weeks-blood in toilet x 2 days-NAD-steady gait

## 2016-06-29 LAB — GASTROINTESTINAL PANEL BY PCR, STOOL (REPLACES STOOL CULTURE)

## 2016-07-04 ENCOUNTER — Encounter (HOSPITAL_BASED_OUTPATIENT_CLINIC_OR_DEPARTMENT_OTHER): Payer: Medicare Other | Attending: Surgery

## 2016-07-04 DIAGNOSIS — G4730 Sleep apnea, unspecified: Secondary | ICD-10-CM | POA: Insufficient documentation

## 2016-07-04 DIAGNOSIS — E10621 Type 1 diabetes mellitus with foot ulcer: Secondary | ICD-10-CM | POA: Insufficient documentation

## 2016-07-04 DIAGNOSIS — L97522 Non-pressure chronic ulcer of other part of left foot with fat layer exposed: Secondary | ICD-10-CM | POA: Diagnosis not present

## 2016-07-04 DIAGNOSIS — L97512 Non-pressure chronic ulcer of other part of right foot with fat layer exposed: Secondary | ICD-10-CM | POA: Insufficient documentation

## 2016-07-04 DIAGNOSIS — E1051 Type 1 diabetes mellitus with diabetic peripheral angiopathy without gangrene: Secondary | ICD-10-CM | POA: Diagnosis not present

## 2016-07-04 DIAGNOSIS — Z86718 Personal history of other venous thrombosis and embolism: Secondary | ICD-10-CM | POA: Diagnosis not present

## 2016-07-04 DIAGNOSIS — E1040 Type 1 diabetes mellitus with diabetic neuropathy, unspecified: Secondary | ICD-10-CM | POA: Diagnosis not present

## 2016-07-05 ENCOUNTER — Other Ambulatory Visit: Payer: Self-pay | Admitting: Endocrinology

## 2016-07-11 DIAGNOSIS — E10621 Type 1 diabetes mellitus with foot ulcer: Secondary | ICD-10-CM | POA: Diagnosis not present

## 2016-07-18 DIAGNOSIS — E10621 Type 1 diabetes mellitus with foot ulcer: Secondary | ICD-10-CM | POA: Diagnosis not present

## 2016-07-21 ENCOUNTER — Other Ambulatory Visit: Payer: Self-pay | Admitting: Endocrinology

## 2016-07-25 DIAGNOSIS — E10621 Type 1 diabetes mellitus with foot ulcer: Secondary | ICD-10-CM | POA: Diagnosis not present

## 2016-08-01 ENCOUNTER — Other Ambulatory Visit: Payer: Self-pay | Admitting: Surgery

## 2016-08-01 ENCOUNTER — Encounter (HOSPITAL_BASED_OUTPATIENT_CLINIC_OR_DEPARTMENT_OTHER): Payer: Medicare Other | Attending: Surgery

## 2016-08-01 ENCOUNTER — Ambulatory Visit (HOSPITAL_COMMUNITY)
Admission: RE | Admit: 2016-08-01 | Discharge: 2016-08-01 | Disposition: A | Discharge status: Home / Self Care | Payer: Medicare Other | Source: Ambulatory Visit | Attending: Surgery | Admitting: Surgery

## 2016-08-01 ENCOUNTER — Other Ambulatory Visit (HOSPITAL_COMMUNITY)
Admission: RE | Admit: 2016-08-01 | Discharge: 2016-08-01 | Disposition: A | Discharge status: Home / Self Care | Payer: Medicare Other | Source: Other Acute Inpatient Hospital | Attending: Surgery | Admitting: Surgery

## 2016-08-01 DIAGNOSIS — E1051 Type 1 diabetes mellitus with diabetic peripheral angiopathy without gangrene: Secondary | ICD-10-CM | POA: Insufficient documentation

## 2016-08-01 DIAGNOSIS — M79671 Pain in right foot: Secondary | ICD-10-CM | POA: Insufficient documentation

## 2016-08-01 DIAGNOSIS — Z89431 Acquired absence of right foot: Secondary | ICD-10-CM | POA: Insufficient documentation

## 2016-08-01 DIAGNOSIS — R52 Pain, unspecified: Secondary | ICD-10-CM

## 2016-08-01 DIAGNOSIS — I10 Essential (primary) hypertension: Secondary | ICD-10-CM | POA: Diagnosis not present

## 2016-08-01 DIAGNOSIS — E10621 Type 1 diabetes mellitus with foot ulcer: Secondary | ICD-10-CM | POA: Diagnosis not present

## 2016-08-01 DIAGNOSIS — E1040 Type 1 diabetes mellitus with diabetic neuropathy, unspecified: Secondary | ICD-10-CM | POA: Insufficient documentation

## 2016-08-01 DIAGNOSIS — Z86718 Personal history of other venous thrombosis and embolism: Secondary | ICD-10-CM | POA: Insufficient documentation

## 2016-08-01 DIAGNOSIS — L97522 Non-pressure chronic ulcer of other part of left foot with fat layer exposed: Secondary | ICD-10-CM | POA: Insufficient documentation

## 2016-08-01 DIAGNOSIS — G4730 Sleep apnea, unspecified: Secondary | ICD-10-CM | POA: Diagnosis not present

## 2016-08-01 DIAGNOSIS — B9689 Other specified bacterial agents as the cause of diseases classified elsewhere: Secondary | ICD-10-CM | POA: Diagnosis not present

## 2016-08-01 DIAGNOSIS — L97512 Non-pressure chronic ulcer of other part of right foot with fat layer exposed: Secondary | ICD-10-CM | POA: Diagnosis not present

## 2016-08-02 ENCOUNTER — Other Ambulatory Visit: Payer: Self-pay

## 2016-08-02 ENCOUNTER — Encounter: Payer: Self-pay | Admitting: Podiatry

## 2016-08-02 ENCOUNTER — Ambulatory Visit (INDEPENDENT_AMBULATORY_CARE_PROVIDER_SITE_OTHER): Payer: Medicare Other | Admitting: Podiatry

## 2016-08-02 DIAGNOSIS — E08621 Diabetes mellitus due to underlying condition with foot ulcer: Secondary | ICD-10-CM

## 2016-08-02 DIAGNOSIS — I70245 Atherosclerosis of native arteries of left leg with ulceration of other part of foot: Secondary | ICD-10-CM

## 2016-08-02 DIAGNOSIS — Z899 Acquired absence of limb, unspecified: Secondary | ICD-10-CM

## 2016-08-02 DIAGNOSIS — L97515 Non-pressure chronic ulcer of other part of right foot with muscle involvement without evidence of necrosis: Secondary | ICD-10-CM | POA: Diagnosis not present

## 2016-08-02 MED ORDER — AMLODIPINE BESYLATE 2.5 MG PO TABS: 2.5000 mg | ORAL_TABLET | Freq: Every day | ORAL | 1 refills | Status: DC

## 2016-08-03 NOTE — Progress Notes (Signed)
Subjective:    Patient ID: [name], female   DOB: 46 y.o.   MRN: [id number]   HPI patient presents stating she pulled some skin off her right plantar midfoot and has developed irritation and is developed breakdown of tissue. She's had a previous transmetatarsal amputation and was seen at the wound care center and had dressing applied yesterday    ROS      Objective:  Physical Exam No change in neurovascular status with an approximate 2 center by 2 cm plantar ulceration right that is superficial with no subcutaneous deep exposure with probing and no bone exposure with no proximal edema erythema or drainage noted.    Assessment:    Plantar ulceration right that has occurred secondary to trauma in poor care by the patient. Patient does not have indications of proximal infection currently     Plan:     Reviewed condition and I'm going to refer to [name] who has taken care of this in the past. I did go ahead today I flushed the area applied Iodosorb with sterile dressing I applied complete dressing to the area and instructed on leaving this on until she is seen back on Monday and I dispensed air fracture walker to try to take pressure off of the foot. I do think long-term she will require a toe filler which would be beneficial and she will probably require some form of skin graft over this area to heal it. I did explain to her that the possibility for proximal amputation is always a possibility and that if she should develop any proximal edema erythema or any systemic signs of infection she is to go straight to the hospital

## 2016-08-04 ENCOUNTER — Other Ambulatory Visit: Payer: Self-pay | Admitting: Endocrinology

## 2016-08-04 LAB — AEROBIC CULTURE W GRAM STAIN (SUPERFICIAL SPECIMEN)

## 2016-08-04 LAB — AEROBIC CULTURE  (SUPERFICIAL SPECIMEN)

## 2016-08-06 ENCOUNTER — Encounter: Payer: Self-pay | Admitting: Podiatry

## 2016-08-06 ENCOUNTER — Ambulatory Visit (INDEPENDENT_AMBULATORY_CARE_PROVIDER_SITE_OTHER): Payer: Medicare Other | Admitting: Podiatry

## 2016-08-06 DIAGNOSIS — E0842 Diabetes mellitus due to underlying condition with diabetic polyneuropathy: Secondary | ICD-10-CM | POA: Diagnosis not present

## 2016-08-06 DIAGNOSIS — L97512 Non-pressure chronic ulcer of other part of right foot with fat layer exposed: Secondary | ICD-10-CM

## 2016-08-06 DIAGNOSIS — I70235 Atherosclerosis of native arteries of right leg with ulceration of other part of foot: Secondary | ICD-10-CM

## 2016-08-07 ENCOUNTER — Telehealth: Payer: Self-pay | Admitting: Podiatry

## 2016-08-07 NOTE — Telephone Encounter (Signed)
Patient saw [name] yesterday and he debrided an ulcer. She stated she is in some type of way today and is requesting a refill on her pain prescription.

## 2016-08-08 DIAGNOSIS — E10621 Type 1 diabetes mellitus with foot ulcer: Secondary | ICD-10-CM | POA: Diagnosis not present

## 2016-08-08 MED ORDER — HYDROCODONE-ACETAMINOPHEN 5-325 MG PO TABS: ORAL_TABLET | ORAL | 0 refills | Status: DC

## 2016-08-08 NOTE — Telephone Encounter (Signed)
Left message informing pt she could pick up the hydrocodone rx [name] ordered in the [place] office.

## 2016-08-08 NOTE — Progress Notes (Signed)
   Subjective:  Patient is a 46 year old female with PMHx of DM presenting today for evaluation of an ulcer to the right foot. She was prescribed Amoxicillin and Bactrim by her wound care doctor which makes her feel nauseated and causes diarrhea. She states her doctor wants her to have surgery to debride the wound. Patient presents today for further treatment and evaluation   Objective/Physical Exam General: The patient is alert and oriented x3 in no acute distress.  Dermatology:  Wound #1 noted to the right foot amputation stump plantar measuring 2.5 x 2.5 x 0.2 cm and medial 1.5 x 2.0 x 0.2 cm (LxWxD).   To the noted ulceration(s), there is no eschar. There is a moderate amount of slough, fibrin, and necrotic tissue noted. Granulation tissue and wound base is red. There is a minimal amount of serosanguineous drainage noted. There is no exposed bone muscle-tendon ligament or joint. There is no malodor. Periwound integrity is intact. Skin is warm, dry and supple bilateral lower extremities.  Vascular: Palpable pedal pulses bilaterally. No edema or erythema noted. Capillary refill within normal limits.  Neurological: Epicritic and protective threshold absent bilaterally.   Musculoskeletal Exam: Range of motion within normal limits to all pedal and ankle joints bilateral. Muscle strength 5/5 in all groups bilateral.   Assessment: #1 right foot ulcers secondary to diabetes mellitus #2 diabetes mellitus w/ peripheral neuropathy   Plan of Care:  #1 Patient was evaluated. #2 medically necessary excisional debridement including subcutaneous tissue was performed using a tissue nipper and a chisel blade. Excisional debridement of all the necrotic nonviable tissue down to healthy bleeding viable tissue was performed with post-debridement measurements same as pre-. #3 the wound was cleansed and dry sterile dressing applied. #4 pt has appt with wound care doctor, Dr. Con Memos, on Wednesday 08/08/16. #5  follow up with wound care #6 patient is to return to clinic when necessary.   Edrick Kins, DPM [facility]  Dr. Edrick Kins, [address]                                        [place], [place] [address]                Office [phone]  Fax [phone]

## 2016-08-08 NOTE — Telephone Encounter (Signed)
This is a [facility] patient. Please prescribe Vicodin 5/325mg  #20 q8h prn pain. Thanks, [name]

## 2016-08-15 ENCOUNTER — Other Ambulatory Visit: Payer: Self-pay

## 2016-08-15 DIAGNOSIS — E10621 Type 1 diabetes mellitus with foot ulcer: Secondary | ICD-10-CM | POA: Diagnosis not present

## 2016-08-15 MED ORDER — PRAVASTATIN SODIUM 40 MG PO TABS: 40.0000 mg | ORAL_TABLET | Freq: Every day | ORAL | 0 refills | Status: DC

## 2016-08-16 ENCOUNTER — Other Ambulatory Visit: Payer: Self-pay | Admitting: Endocrinology

## 2016-08-18 ENCOUNTER — Other Ambulatory Visit: Payer: Self-pay | Admitting: Endocrinology

## 2016-08-22 DIAGNOSIS — E10621 Type 1 diabetes mellitus with foot ulcer: Secondary | ICD-10-CM | POA: Diagnosis not present

## 2016-08-29 ENCOUNTER — Ambulatory Visit (INDEPENDENT_AMBULATORY_CARE_PROVIDER_SITE_OTHER): Payer: Medicare Other

## 2016-08-29 ENCOUNTER — Ambulatory Visit (INDEPENDENT_AMBULATORY_CARE_PROVIDER_SITE_OTHER): Payer: Medicare Other | Admitting: Podiatry

## 2016-08-29 DIAGNOSIS — I70245 Atherosclerosis of native arteries of left leg with ulceration of other part of foot: Secondary | ICD-10-CM

## 2016-08-29 DIAGNOSIS — L02612 Cutaneous abscess of left foot: Secondary | ICD-10-CM

## 2016-08-29 DIAGNOSIS — L97515 Non-pressure chronic ulcer of other part of right foot with muscle involvement without evidence of necrosis: Secondary | ICD-10-CM

## 2016-08-29 DIAGNOSIS — E0842 Diabetes mellitus due to underlying condition with diabetic polyneuropathy: Secondary | ICD-10-CM

## 2016-08-29 DIAGNOSIS — L97512 Non-pressure chronic ulcer of other part of right foot with fat layer exposed: Secondary | ICD-10-CM

## 2016-08-30 ENCOUNTER — Telehealth: Payer: Self-pay | Admitting: *Deleted

## 2016-08-30 NOTE — Progress Notes (Signed)
   Subjective:  Patient is a 46 year old female with PMHx of DM presenting today for follow up evaluation of an ulcer to the right foot. She reports a new ulcer present to the left dorsal foot now.   Objective/Physical Exam General: The patient is alert and oriented x3 in no acute distress.  Dermatology:  Wound #1 noted to the right TMA stump measuring 3.0 x 3.0 x 0.2 cm.  Wound #2 noted to the right dorsal TMA stump measuring 1.5 x 1.5 x 0.1 cm.  Wound #3 noted to the left plantar foot measuring 1.0 x 2.5 x 0.2 cm.  To the noted ulceration(s), there is no eschar. There is a moderate amount of slough, fibrin, and necrotic tissue noted. Granulation tissue and wound base is red. There is a minimal amount of serosanguineous drainage noted. There is no exposed bone muscle-tendon ligament or joint. There is no malodor. Periwound integrity is intact. Skin is warm, dry and supple bilateral lower extremities.  Vascular: Palpable pedal pulses bilaterally. No edema or erythema noted. Capillary refill within normal limits.  Neurological: Epicritic and protective threshold absent bilaterally.   Musculoskeletal Exam: Range of motion within normal limits to all pedal and ankle joints bilateral. Muscle strength 5/5 in all groups bilateral.   Radiographic Exam:  Normal osseous mineralization. Joint spaces preserved. No fracture/dislocation/boney destruction. Evidence of partial fifth ray amputation noted with ulcer overlying the lateral aspect of the foot Stable transmetatarsal amputation noted to the right foot  Assessment: #1 bilateral foot ulcers secondary to diabetes mellitus #2 diabetes mellitus w/ peripheral neuropathy   Plan of Care:  #1 Patient was evaluated. X-Rays reviewed. #2 medically necessary excisional debridement including muscle and deep fascial tissue was performed using a tissue nipper and a chisel blade. Excisional debridement of all the necrotic nonviable tissue down to healthy  bleeding viable tissue was performed with post-debridement measurements same as pre-. #3 The wound was cleansed and dry sterile dressing and Prisma applied. #4 authorization for skin graft, Integra primatrix. #5 Culture taken today.  #6 Return to clinic in 2 weeks.    Edrick Kins, DPM [facility]  Dr. Edrick Kins, [address]                                        [place], [place] [address]                Office [phone]  Fax [phone]

## 2016-08-30 NOTE — Telephone Encounter (Addendum)
-----   Message from Tomi Bamberger, [place] sent at 08/29/2016 10:34 AM EDT ----- Regarding: skin graft  Can you please get authorization for Integra Primatrix Graft for patient?  Thanks. 08/30/2016-To be faxed required form, clinicals and demographics to [employer], once clinicals are available for 08/29/2016.09/05/2016-Faxed required form, clinicals and demographics to [employer].09/06/2016-Received Patient Insurance Benefit Verification Results from [place] reviewed and states pt would pay $200.00/application.09/07/2016-Left message requesting call back concerning orders from [name]. Left message informing pt of cost to her for each Integra PriMatrix graft application with 10 grafts possible, and to call me to let me know she had received this message and if she would like to begin graft process. Pt states she has $800.00/month and asked if she could make payments. I transferred to [place] to discuss. [place] states pt has [employer] and the coverage will help with cost and pt has been informed of the payment plan and does want to begin PriMatrix therapy.09/10/2016-Faxed and Emailed 1st PriMatrix order to [employer]. 09/24/2016-Faxed orders to [employer] - PriMatrix for two [phone] meshed 3.0 x 3.0cm for appt 10/08/2016 and 10/22/2016.10/16/2016-Left message for [place] for pt to have daily wound care, cleanse wound with wound cleanse, dry, paint area with betadine, cover with 4 x 4 gauze, kerlix and an ace wrap daily.12/20/2016-Left message stating Plan for 12/18/2016 ordered betadine to both ulcers, the wet to dry dressings daily, and to inform if continuing [place] with [facility].

## 2016-09-05 ENCOUNTER — Encounter (HOSPITAL_BASED_OUTPATIENT_CLINIC_OR_DEPARTMENT_OTHER): Payer: Medicare Other

## 2016-09-10 ENCOUNTER — Ambulatory Visit (INDEPENDENT_AMBULATORY_CARE_PROVIDER_SITE_OTHER): Payer: Medicare Other | Admitting: Podiatry

## 2016-09-10 DIAGNOSIS — L97512 Non-pressure chronic ulcer of other part of right foot with fat layer exposed: Secondary | ICD-10-CM

## 2016-09-10 DIAGNOSIS — I70245 Atherosclerosis of native arteries of left leg with ulceration of other part of foot: Secondary | ICD-10-CM

## 2016-09-10 DIAGNOSIS — E0842 Diabetes mellitus due to underlying condition with diabetic polyneuropathy: Secondary | ICD-10-CM

## 2016-09-10 DIAGNOSIS — I70235 Atherosclerosis of native arteries of right leg with ulceration of other part of foot: Secondary | ICD-10-CM

## 2016-09-10 MED ORDER — GABAPENTIN 300 MG PO CAPS: 300.0000 mg | ORAL_CAPSULE | Freq: Two times a day (BID) | ORAL | 0 refills | Status: DC

## 2016-09-19 NOTE — Progress Notes (Signed)
   Subjective:  Patient is a 46 year old female with PMHx of DM presenting today for follow up evaluation of ulcers to bilateral feet. She states her condition is unchanged. She has no new complaints today.    Objective/Physical Exam General: The patient is alert and oriented x3 in no acute distress.  Dermatology:  Wound #1 noted to the right amputation stump measuring 2.0 x 3.0 x 0.2 cm.  Wound #2 noted to the left lateral foot measuring 0.8 x 2.3 x 0.2 cm.  To the noted ulceration(s), there is no eschar. There is a moderate amount of slough, fibrin, and necrotic tissue noted. Granulation tissue and wound base is red. There is a minimal amount of serosanguineous drainage noted. There is no exposed bone muscle-tendon ligament or joint. There is no malodor. Periwound integrity is intact. Skin is warm, dry and supple bilateral lower extremities.  Vascular: Palpable pedal pulses bilaterally. No edema or erythema noted. Capillary refill within normal limits.  Neurological: Epicritic and protective threshold absent bilaterally.   Musculoskeletal Exam: Range of motion within normal limits to all pedal and ankle joints bilateral. Muscle strength 5/5 in all groups bilateral.   Assessment: #1 bilateral foot ulcers secondary to diabetes mellitus #2 diabetes mellitus w/ peripheral neuropathy   Plan of Care:  #1 Patient was evaluated. #2 medically necessary excisional debridement including muscle and deep fascial tissue was performed using a tissue nipper and a chisel blade. Excisional debridement of all the necrotic nonviable tissue down to healthy bleeding viable tissue was performed with post-debridement measurements same as pre-. #3 The wound was cleansed and dry sterile dressing applied. #4 prescription for gabapentin 300 mg #90 given to patient. #5 return to clinic in 2 weeks for Primatrix application.    Edrick Kins, DPM [facility]  Dr. Edrick Kins, [address]                                        [place], [place] [address]                Office [phone]  Fax [phone]

## 2016-09-24 ENCOUNTER — Ambulatory Visit: Payer: Medicare Other | Admitting: Podiatry

## 2016-09-28 ENCOUNTER — Encounter (HOSPITAL_BASED_OUTPATIENT_CLINIC_OR_DEPARTMENT_OTHER): Payer: Self-pay | Admitting: Emergency Medicine

## 2016-09-28 ENCOUNTER — Emergency Department (HOSPITAL_BASED_OUTPATIENT_CLINIC_OR_DEPARTMENT_OTHER)
Admission: EM | Admit: 2016-09-28 | Discharge: 2016-09-29 | Disposition: A | Discharge status: Home / Self Care | Payer: Medicare Other | Attending: Emergency Medicine | Admitting: Emergency Medicine

## 2016-09-28 DIAGNOSIS — W19XXXA Unspecified fall, initial encounter: Secondary | ICD-10-CM

## 2016-09-28 DIAGNOSIS — L97519 Non-pressure chronic ulcer of other part of right foot with unspecified severity: Secondary | ICD-10-CM | POA: Insufficient documentation

## 2016-09-28 DIAGNOSIS — I129 Hypertensive chronic kidney disease with stage 1 through stage 4 chronic kidney disease, or unspecified chronic kidney disease: Secondary | ICD-10-CM | POA: Insufficient documentation

## 2016-09-28 DIAGNOSIS — Z794 Long term (current) use of insulin: Secondary | ICD-10-CM | POA: Insufficient documentation

## 2016-09-28 DIAGNOSIS — E10621 Type 1 diabetes mellitus with foot ulcer: Secondary | ICD-10-CM | POA: Insufficient documentation

## 2016-09-28 DIAGNOSIS — Z7902 Long term (current) use of antithrombotics/antiplatelets: Secondary | ICD-10-CM | POA: Diagnosis not present

## 2016-09-28 DIAGNOSIS — R0789 Other chest pain: Secondary | ICD-10-CM | POA: Insufficient documentation

## 2016-09-28 DIAGNOSIS — E1022 Type 1 diabetes mellitus with diabetic chronic kidney disease: Secondary | ICD-10-CM | POA: Insufficient documentation

## 2016-09-28 DIAGNOSIS — Z87891 Personal history of nicotine dependence: Secondary | ICD-10-CM | POA: Insufficient documentation

## 2016-09-28 DIAGNOSIS — Z79899 Other long term (current) drug therapy: Secondary | ICD-10-CM | POA: Insufficient documentation

## 2016-09-28 DIAGNOSIS — M79604 Pain in right leg: Secondary | ICD-10-CM | POA: Diagnosis present

## 2016-09-28 DIAGNOSIS — R0781 Pleurodynia: Secondary | ICD-10-CM

## 2016-09-28 DIAGNOSIS — N183 Chronic kidney disease, stage 3 (moderate): Secondary | ICD-10-CM | POA: Insufficient documentation

## 2016-09-28 DIAGNOSIS — L03115 Cellulitis of right lower limb: Secondary | ICD-10-CM

## 2016-09-28 NOTE — ED Triage Notes (Signed)
Fell in gym x 5 days ago  Injury to rt ankle and knee.  Leaned against a car and felt something pop and now has pain in left rib cage

## 2016-09-29 ENCOUNTER — Emergency Department (HOSPITAL_BASED_OUTPATIENT_CLINIC_OR_DEPARTMENT_OTHER): Payer: Medicare Other

## 2016-09-29 LAB — CBG MONITORING, ED: Glucose-Capillary: 260 mg/dL — ABNORMAL HIGH (ref 65–99)

## 2016-09-29 MED ORDER — SULFAMETHOXAZOLE-TRIMETHOPRIM 800-160 MG PO TABS
1.0000 | ORAL_TABLET | Freq: Once | ORAL | Status: AC
  Administered 2016-09-29: 1 via ORAL
  Filled 2016-09-29: qty 1

## 2016-09-29 MED ORDER — CEPHALEXIN 250 MG PO CAPS
500.0000 mg | ORAL_CAPSULE | Freq: Once | ORAL | Status: AC
  Administered 2016-09-29: 500 mg via ORAL
  Filled 2016-09-29: qty 2

## 2016-09-29 MED ORDER — PROBIOTIC PO CAPS: 1.0000 | ORAL_CAPSULE | Freq: Every day | ORAL | 1 refills | Status: DC

## 2016-09-29 MED ORDER — SULFAMETHOXAZOLE-TRIMETHOPRIM 800-160 MG PO TABS: 1.0000 | ORAL_TABLET | Freq: Two times a day (BID) | ORAL | 0 refills | Status: AC

## 2016-09-29 MED ORDER — TRAMADOL HCL 50 MG PO TABS: 50.0000 mg | ORAL_TABLET | Freq: Four times a day (QID) | ORAL | 0 refills | Status: DC | PRN

## 2016-09-29 MED ORDER — CEPHALEXIN 500 MG PO CAPS
500.0000 mg | ORAL_CAPSULE | Freq: Four times a day (QID) | ORAL | 0 refills | Status: DC
Start: 2016-09-29 — End: 2016-12-24

## 2016-09-29 NOTE — ED Notes (Signed)
Patient transported to X-ray 

## 2016-09-29 NOTE — ED Provider Notes (Signed)
TIME SEEN: 12:26 AM  CHIEF COMPLAINT: Right leg pain, left rib pain  HPI: Patient is a 46 year old female with history of hypertension, hyperlipidemia, insulin-dependent diabetes, chronic kidney disease who presents the emergency department with complaints of right leg pain. She states that several days ago she tripped and fell at the gym falling onto her right knee. She did not strike her head or lose consciousness. She states since that time she has had discomfort throughout the right knee into the right foot. She states it is hard for her to tell where she is hurting from because of chronic neuropathy in this leg. She does have an ulcer to the bottom of the right foot that has been there for over 2 months and she has been followed by wound care and podiatry for this. Has follow-up with podiatry on July 11. Denies any drainage from this wound. Denies any fevers, chills, nausea, vomiting or diarrhea. States she did notice that the area around the right ankle seemed warm to touch today. No calf swelling or calf tenderness. She previously has had DVT.   Patient also reports that today she leaned over the console in her car and felt a pop in the left ribs. Has had pain to palpation over this area. No shortness of breath or cough. No other chest pain. States she has had "cracked ribs" before.  ROS: See HPI Constitutional: no fever  Eyes: no drainage  ENT: no runny nose   Cardiovascular:  Left lateral chest wall pain  Resp: no SOB  GI: no vomiting GU: no dysuria Integumentary: no rash  Allergy: no hives  Musculoskeletal: no leg swelling  Neurological: no slurred speech ROS otherwise negative  PAST MEDICAL HISTORY/PAST SURGICAL HISTORY:  Past Medical History:  Diagnosis Date  . Anemia   . C. difficile diarrhea 09/26/2014  . Cataracts, both eyes   . Cellulitis of right foot 06/02/2014  . CKD (chronic kidney disease) stage 3, GFR 30-59 ml/min    sees Dr Justin Mend- Stage IV  . Diabetic ulcer of right  foot ([place]) 06/02/2014  . DVT (deep venous thrombosis) ([place]) 10/2014   one in each one in leg- PICC line   . H/O seasonal allergies   . Heart murmur    told once when she was pregnant- no furter mention- was in notes 11/2015- had Echo 8 /4/17  . History of blood transfusion   . Hypercholesteremia   . Hypertension   . Left foot infection   . Neuropathy    feet  . Neuropathy in diabetes ([place])   . Peripheral vascular disease ([place])   . Staphylococcus aureus bacteremia 10/2014  . Type 1 diabetes ([place])    onset age 75    MEDICATIONS:  Prior to Admission medications   Medication Sig Start Date End Date Taking? Authorizing Provider  albuterol (PROVENTIL HFA;VENTOLIN HFA) 108 (90 BASE) MCG/ACT inhaler Inhale 2 puffs into the lungs every 6 (six) hours as needed for wheezing or shortness of breath. Reported on 04/15/2015    [provider]  amLODipine (NORVASC) 2.5 MG tablet Take 1 tablet (2.5 mg total) by mouth daily. 08/02/16   Elayne Snare, MD  amoxicillin-clavulanate (AUGMENTIN) 875-125 MG tablet  08/01/16   [provider]  aspirin 325 MG tablet Take 325 mg by mouth daily.    [provider]  calcitRIOL (ROCALTROL) 0.5 MCG capsule Take 1 capsule by mouth daily. 03/13/16   [provider]  Calcium-Phosphorus-Vitamin D (CALCIUM GUMMIES) [phone] MG-MG-UNIT CHEW Chew 2  each by mouth daily.    [provider]  CINNAMON PO Take 1 tablet by mouth daily.    [provider]  Cyanocobalamin (VITAMIN B12 PO) Take 1 each by mouth daily.    [provider]  folic acid (FOLVITE) 1 MG tablet Take 1 mg by mouth daily.    [provider]  furosemide (LASIX) 40 MG tablet Take 40 mg by mouth daily as needed for fluid.     [provider]  gabapentin (NEURONTIN) 300 MG capsule Take 1 capsule (300 mg total) by mouth 2 (two) times daily. 09/10/16   Edrick Kins, DPM  glucose blood (BAYER CONTOUR NEXT TEST) test strip Use as instructed to  check blood sugar 4 times per day dx code E10.65 12/07/15   Elayne Snare, MD  HUMALOG 100 UNIT/ML injection CALL MD IF <70, IF 151-200 =2UNITS,201-250 =4 UNITS,251-300 =6 UNITS, 301-350 =8 UNITS,351-400 =10 U 08/16/16   Elayne Snare, MD  HYDROcodone-acetaminophen (NORCO/VICODIN) 5-325 MG tablet Take one tablet every 8 hours prn foot pain. 08/08/16   Edrick Kins, DPM  Insulin Human (INSULIN PUMP) SOLN Inject into the skin. 15 carbs = 1 unit, every 50 sugars = 1 unit    [provider]  metoCLOPramide (REGLAN) 5 MG tablet Take 1 tablet (5 mg total) by mouth 3 (three) times daily before meals. 06/26/16   Elayne Snare, MD  metroNIDAZOLE (FLAGYL) 500 MG tablet Take 1 tablet (500 mg total) by mouth 3 (three) times daily. 06/28/16   Iley Deignan, Ozella Almond, PA-C  Multiple Minerals-Vitamins (NUTRA-SUPPORT BONE) CAPS Take 1 each by mouth daily.    [provider]  Multiple Vitamins-Minerals (VISION PLUS) CAPS Take 1 each by mouth daily.    [provider]  norethindrone (AYGESTIN) 5 MG tablet Take 1 tablet by mouth See admin instructions. Take 1 tablet daily for 14 days and off 14 days..    [provider]  ondansetron (ZOFRAN-ODT) 4 MG disintegrating tablet DISSOLVE 1 TABLET ON TONGUE EVERY 8 HOURS AS NEEDED FOR NAUSEA 04/23/16   [provider]  pravastatin (PRAVACHOL) 40 MG tablet Take 1 tablet (40 mg total) by mouth daily. 08/15/16   Elayne Snare, MD  pyridoxine (B-6) 100 MG tablet Take 100 mg by mouth daily. Reported on 04/15/2015    [provider]  sulfamethoxazole-trimethoprim (BACTRIM DS,SEPTRA DS) 800-160 MG tablet Take 1 tablet by mouth. Takes at night 08/01/16   [provider]  vitamin C (ASCORBIC ACID) 500 MG tablet Take 500 mg by mouth daily.    [provider]  Vitamin D, Ergocalciferol, (DRISDOL) 50000 units CAPS capsule TAKE ONE CAPSULE BY MOUTH EVERY SUNDAY EVENING 07/05/16   Elayne Snare, MD    ALLERGIES:  Allergies  Allergen Reactions   . Cleocin [Clindamycin Hcl] Diarrhea    Diarrhea   . Lisinopril Other (See Comments)    Elevated potassium per pt report  . Amoxicillin Diarrhea and Nausea Only  . Bactrim [Sulfamethoxazole-Trimethoprim] Diarrhea and Nausea Only  . Ciprofloxacin Itching    SOCIAL HISTORY:  Social History  Substance Use Topics  . Smoking status: Former Smoker    Years: 15.00    Quit date: 05/16/2008  . Smokeless tobacco: Never Used  . Alcohol use 0.5 oz/week    1 Standard drinks or equivalent per week     Comment: Occasional    FAMILY HISTORY: Family History  Problem Relation Age of Onset  . Hyperlipidemia Mother   . Dementia Mother  EXAM: BP 122/83 (BP Location: Left Arm)   Pulse 90   Temp 98.6 F (37 C) (Oral)   Resp 20   Ht 5\' 7"  (1.702 m)   Wt 125 kg (275 lb 9.2 oz)   SpO2 100%   BMI 43.16 kg/m  CONSTITUTIONAL: Alert and oriented and responds appropriately to questions. Well-appearing; well-nourished; GCS 15, Patient is in no distress HEAD: Normocephalic; atraumatic EYES: Conjunctivae clear, PERRL, EOMI ENT: normal nose; no rhinorrhea; moist mucous membranes; pharynx without lesions noted; no dental injury; no septal hematoma NECK: Supple, no meningismus, no LAD; no midline spinal tenderness, step-off or deformity; trachea midline CARD: RRR; S1 and S2 appreciated; no murmurs, no clicks, no rubs, no gallops RESP: Normal chest excursion without splinting or tachypnea; breath sounds clear and equal bilaterally; no wheezes, no rhonchi, no rales; no hypoxia or respiratory distress CHEST:  chest wall stable, no crepitus or ecchymosis or deformity, tender to palpation over the lower left lateral chest wall, no flail chest ABD/GI: Normal bowel sounds; non-distended; soft, non-tender, no rebound, no guarding; no ecchymosis or other lesions noted; insulin pump is attached in the left lower quadrant PELVIS:  stable, nontender to palpation BACK:  The back appears normal and is non-tender  to palpation, there is no CVA tenderness; no midline spinal tenderness, step-off or deformity EXT: Patient is status post transmetatarsal amputation of the right foot. She does have some bony tenderness over the right knee, right proximal tibia, right ankle diffusely and right proximal foot. There is a very mild amount of erythema noted to the distal right tibia area with very minimal warmth. There is no fluctuance or induration noted. She has palpable 2+ DP pulses bilaterally. There is a 3 x 3 cm ulcer that does not appear to go to the bone noted to the medial aspect of the sole of the right foot with no surrounding erythema, warmth and no drainage. Normal ROM in all joints; otherwise extremities are non-tender to palpation; no edema; normal capillary refill; no cyanosis, no bony tenderness or bony deformity of patient's extremities, no joint effusion, compartments are soft, extremities are warm and well-perfused, no ecchymosis SKIN: Normal color for age and race; warm NEURO: Moves all extremities equally, diminished sensation in bilateral lower extremity is from her neuropathy that is chronic, no other sensory deficit, normal speech, cranial nerves II through XII intact, normal gait PSYCH: The patient's mood and manner are appropriate. Grooming and personal hygiene are appropriate.  MEDICAL DECISION MAKING: Patient here with mechanical fall that occurred several days ago complaining of right leg pain. Compartments are soft. She is neurovascularly intact distally. She does have a chronic ulcer to the bottom of this foot that does not appear infected but she does have some erythema and very mild warmth noted just proximal to the right ankle over the anterior shin. Discussed with her that this could be the beginning of cellulitis and I have recommended oral antibiotics especially given she is a diabetic and she does have a wound at this foot. We will discharge her with Bactrim and Keflex. She is comfortable  with this plan. She has podiatry follow-up scheduled on July 11. X-rays of this lower extremity are unremarkable and show no bony injury or any kind of bony destruction in the foot. There is no other sign of trauma from this fall on exam. Will discharge with tramadol. Blood glucose is in the 200s. Recommended close glucose control at home.  She has an insulin pump.   Patient  also complaining of left rib pain. X-ray show no acute abnormality. No pneumothorax or pneumonia. Her lungs are clear and she has no shortness of breath or hypoxia. Have advised her to continue Tylenol, tramadol for pain control. I do not think this is ACS, PE or dissection. I do feel she is safe to be discharged home.    At this time, I do not feel there is any life-threatening condition present. I have reviewed and discussed all results (EKG, imaging, lab, urine as appropriate) and exam findings with patient/family. I have reviewed nursing notes and appropriate previous records.  I feel the patient is safe to be discharged home without further emergent workup and can continue workup as an outpatient as needed. Discussed usual and customary return precautions. Patient/family verbalize understanding and are comfortable with this plan.  Outpatient follow-up has been provided if needed. All questions have been answered.       Cristella Stiver, Delice Bison, DO 09/29/16 [phone]

## 2016-09-29 NOTE — Discharge Instructions (Addendum)
You may alternate Tylenol 1000 mg every 6 hours as needed for pain. ° °

## 2016-10-02 ENCOUNTER — Other Ambulatory Visit: Payer: Medicare Other

## 2016-10-05 ENCOUNTER — Other Ambulatory Visit: Payer: Self-pay | Admitting: Endocrinology

## 2016-10-05 ENCOUNTER — Other Ambulatory Visit (INDEPENDENT_AMBULATORY_CARE_PROVIDER_SITE_OTHER): Payer: Medicare Other

## 2016-10-05 DIAGNOSIS — E1065 Type 1 diabetes mellitus with hyperglycemia: Secondary | ICD-10-CM | POA: Diagnosis not present

## 2016-10-05 LAB — COMPREHENSIVE METABOLIC PANEL
ALT: 70 U/L — AB (ref 0–35)
AST: 48 U/L — ABNORMAL HIGH (ref 0–37)
Albumin: 3.4 g/dL — ABNORMAL LOW (ref 3.5–5.2)
Alkaline Phosphatase: 239 U/L — ABNORMAL HIGH (ref 39–117)
BILIRUBIN TOTAL: 0.2 mg/dL (ref 0.2–1.2)
BUN: 60 mg/dL — ABNORMAL HIGH (ref 6–23)
CO2: 14 meq/L — AB (ref 19–32)
Calcium: 7.6 mg/dL — ABNORMAL LOW (ref 8.4–10.5)
Chloride: 105 mEq/L (ref 96–112)
Creatinine, Ser: 2.93 mg/dL — ABNORMAL HIGH (ref 0.40–1.20)
GFR: 18.44 mL/min — AB (ref >60.00)
GLUCOSE: 358 mg/dL — AB (ref 70–99)
POTASSIUM: 5.1 meq/L (ref 3.5–5.1)
Sodium: 128 mEq/L — ABNORMAL LOW (ref 135–145)
Total Protein: 6.8 g/dL (ref 6.0–8.3)

## 2016-10-05 LAB — MICROALBUMIN / CREATININE URINE RATIO
CREATININE, U: 75.1 mg/dL
MICROALB/CREAT RATIO: 186 mg/g — AB (ref 0.0–30.0)
Microalb, Ur: 139.6 mg/dL — ABNORMAL HIGH (ref 0.0–1.9)

## 2016-10-05 LAB — HEMOGLOBIN A1C: HEMOGLOBIN A1C: 11.6 % — AB (ref 4.6–6.5)

## 2016-10-08 ENCOUNTER — Encounter: Payer: Self-pay | Admitting: Endocrinology

## 2016-10-08 ENCOUNTER — Ambulatory Visit (INDEPENDENT_AMBULATORY_CARE_PROVIDER_SITE_OTHER): Payer: Medicare Other | Admitting: Endocrinology

## 2016-10-08 ENCOUNTER — Telehealth: Payer: Self-pay

## 2016-10-08 VITALS — BP 122/82 | HR 94 | Ht 67.0 in | Wt 122.8 lb

## 2016-10-08 DIAGNOSIS — I70235 Atherosclerosis of native arteries of right leg with ulceration of other part of foot: Secondary | ICD-10-CM | POA: Diagnosis not present

## 2016-10-08 DIAGNOSIS — N184 Chronic kidney disease, stage 4 (severe): Secondary | ICD-10-CM

## 2016-10-08 DIAGNOSIS — E1065 Type 1 diabetes mellitus with hyperglycemia: Secondary | ICD-10-CM

## 2016-10-08 DIAGNOSIS — E1022 Type 1 diabetes mellitus with diabetic chronic kidney disease: Secondary | ICD-10-CM

## 2016-10-08 DIAGNOSIS — E7800 Pure hypercholesterolemia, unspecified: Secondary | ICD-10-CM

## 2016-10-08 NOTE — Progress Notes (Signed)
Patient ID: [name], female   DOB: 1970-09-24, 46 y.o.   MRN: [id number]   Reason for Appointment: follow-up of diabetes  History of Present Illness   Diagnosis: Type 1 DIABETES MELITUS  Previous history: She has had long-standing poorly controlled diabetes and typically has poor compliance with self care measures despite periodic diabetes education and periodic followup  INSULIN PUMP regimen:   BASAL rate 0.35 at midnight, 0.45 at 12 noon and 0.35 at 3:30 PM  Carb coverage 1: 17 g and correction 1: 55 with target 120, active insulin 6 hours         Recent history:   Her last A1c was 11% and is now 11.6  Current blood sugar patterns, difficulties with management and problems identified:  She has not been seen for over 3 months  Blood sugars are again poorly controlled with fairly consistent hyperglycemia throughout the day except rarely when her blood sugar may come down below 300  She has not had any pump supplies over the last week as she ran out and is only taking Humalog injections  Although she is cutting back on juices and other soft drinks she is still drinking a lot of milk  She tries to bolus periodically for high sugars but even with correction doses her blood sugars only rarely come down below 300; does not have enough readings after her boluses to see how her correction doses are working  Also not putting in her carbohydrate intake regularly and sometimes arbitrarily giving boluses  She does not have any plan meals and may at times eating a lot of carbohydrates also  Some of her meals may be high fat with cheese   Mean values apply above for all meters except median for One Touch  PRE-MEAL Fasting Lunch Dinner Bedtime Overall  Glucose range: 66-400 +   236-400 +   284-400 +    Mean/median:     424+/-122     Meals: eating at variable times and also having snacks periodically.  Her daily carbohydrate intake is quite variable Physical  activity: exercise: none         Dietician visit: Most recent:?          Complications: are: Neuropathy, nephropathy, diabetic foot ulcer      Wt Readings from Last 3 Encounters:  10/08/16 122 lb 12.8 oz (55.7 kg)  09/28/16 275 lb 9.2 oz (125 kg)  06/28/16 128 lb (58.1 kg)   Lab Results  Component Value Date   HGBA1C 11.6 (H) 10/05/2016   HGBA1C 11.0 (H) 05/02/2016   HGBA1C 12.0 02/22/2016   Lab Results  Component Value Date   MICROALBUR 139.6 (H) 10/05/2016   LDLCALC 78 05/02/2016   CREATININE 2.93 (H) 10/05/2016    Other problems addressed:: See review of systems  Allergies as of 10/08/2016      Reactions   Cleocin [clindamycin Hcl] Diarrhea   Diarrhea    Lisinopril Other (See Comments)   Elevated potassium per pt report   Amoxicillin Diarrhea, Nausea Only   Bactrim [sulfamethoxazole-trimethoprim] Diarrhea, Nausea Only   Ciprofloxacin Itching      Medication List       Accurate as of 10/08/16  9:30 AM. Always use your most recent med list.          albuterol 108 (90 Base) MCG/ACT inhaler Commonly known as:  PROVENTIL HFA;VENTOLIN HFA Inhale 2 puffs into the lungs every 6 (six) hours as needed for wheezing or shortness of  breath. Reported on 04/15/2015   amLODipine 2.5 MG tablet Commonly known as:  NORVASC Take 1 tablet (2.5 mg total) by mouth daily.   aspirin 325 MG tablet Take 325 mg by mouth daily.   calcitRIOL 0.5 MCG capsule Commonly known as:  ROCALTROL Take 1 capsule by mouth daily.   CALCIUM GUMMIES 250-100-500 MG-MG-UNIT Chew Generic drug:  Calcium-Phosphorus-Vitamin D Chew 2 each by mouth daily.   cephALEXin 500 MG capsule Commonly known as:  KEFLEX Take 1 capsule (500 mg total) by mouth 4 (four) times daily.   CINNAMON PO Take 1 tablet by mouth daily.   folic acid 1 MG tablet Commonly known as:  FOLVITE Take 1 mg by mouth daily.   furosemide 40 MG tablet Commonly known as:  LASIX Take 40 mg by mouth daily as needed for fluid.     gabapentin 300 MG capsule Commonly known as:  NEURONTIN Take 1 capsule (300 mg total) by mouth 2 (two) times daily.   glucose blood test strip Commonly known as:  BAYER CONTOUR NEXT TEST Use as instructed to check blood sugar 4 times per day dx code E10.65   HUMALOG 100 UNIT/ML injection Generic drug:  insulin lispro CALL MD IF <70, IF 151-200 =2UNITS,201-250 =4 UNITS,251-300 =6 UNITS, 301-350 =8 UNITS,351-400 =10 U   insulin pump Soln Inject into the skin. 15 carbs = 1 unit, every 50 sugars = 1 unit   metoCLOPramide 5 MG tablet Commonly known as:  REGLAN Take 1 tablet (5 mg total) by mouth 3 (three) times daily before meals.   norethindrone 5 MG tablet Commonly known as:  AYGESTIN Take 1 tablet by mouth See admin instructions. Take 1 tablet daily for 14 days and off 14 days.Philbert Riser BONE Caps Take 1 each by mouth daily.   ondansetron 4 MG disintegrating tablet Commonly known as:  ZOFRAN-ODT DISSOLVE 1 TABLET ON TONGUE EVERY 8 HOURS AS NEEDED FOR NAUSEA   pravastatin 40 MG tablet Commonly known as:  PRAVACHOL Take 1 tablet (40 mg total) by mouth daily.   Probiotic Caps Take 1 capsule by mouth daily.   pyridoxine 100 MG tablet Commonly known as:  B-6 Take 100 mg by mouth daily. Reported on 04/15/2015   traMADol 50 MG tablet Commonly known as:  ULTRAM Take 1 tablet (50 mg total) by mouth every 6 (six) hours as needed.   VISION PLUS Caps Take 1 each by mouth daily.   VITAMIN B12 PO Take 1 each by mouth daily.   vitamin C 500 MG tablet Commonly known as:  ASCORBIC ACID Take 500 mg by mouth daily.   Vitamin D (Ergocalciferol) 50000 units Caps capsule Commonly known as:  DRISDOL TAKE ONE CAPSULE BY MOUTH EVERY SUNDAY EVENING       Allergies:  Allergies  Allergen Reactions  . Cleocin [Clindamycin Hcl] Diarrhea    Diarrhea   . Lisinopril Other (See Comments)    Elevated potassium per pt report  . Amoxicillin Diarrhea and Nausea Only  . Bactrim  [Sulfamethoxazole-Trimethoprim] Diarrhea and Nausea Only  . Ciprofloxacin Itching    Past Medical History:  Diagnosis Date  . Anemia   . C. difficile diarrhea 09/26/2014  . Cataracts, both eyes   . Cellulitis of right foot 06/02/2014  . CKD (chronic kidney disease) stage 3, GFR 30-59 ml/min    sees Dr Justin Mend- Stage IV  . Diabetic ulcer of right foot ([place]) 06/02/2014  . DVT (deep venous thrombosis) ([place]) 10/2014   one in  each one in leg- PICC line   . H/O seasonal allergies   . Heart murmur    told once when she was pregnant- no furter mention- was in notes 11/2015- had Echo 8 /4/17  . History of blood transfusion   . Hypercholesteremia   . Hypertension   . Left foot infection   . Neuropathy    feet  . Neuropathy in diabetes ([place])   . Peripheral vascular disease ([place])   . Staphylococcus aureus bacteremia 10/2014  . Type 1 diabetes ([place])    onset age 79    Past Surgical History:  Procedure Laterality Date  . AMPUTATION TOE Left 07/24/2015   5th toe and Hallux amputation  . CESAREAN SECTION    . EXOSTECTECTOMY TOE Left 04/11/2016   Procedure: EXOSTECTECTOMY CUBOID AND 5TH METATARSAL AND PLACEMENT OF ANTIBIOTIC BEADS;  Surgeon: Edrick Kins, DPM;  Location: [facility];  Service: Podiatry;  Laterality: Left;  . EYE SURGERY Bilateral    [place]  . FEMORAL-POPLITEAL BYPASS GRAFT Left 05/03/2015  . FOOT AMPUTATION THROUGH METATARSAL Right 2016  . hemrrhoidectomy    . I&D EXTREMITY Right 06/10/2014   Procedure: IRRIGATION AND DEBRIDEMENT Right Foot;  Surgeon: Newt Minion, MD;  Location: [place] [facility];  Service: Orthopedics;  Laterality: Right;  . INCISION AND DRAINAGE Left 04/11/2016   Procedure: INCISION AND DRAINAGE A DEEP COMPLICATED WOUND OF LEFT FOOT;  Surgeon: Edrick Kins, DPM;  Location: [facility];  Service: Podiatry;  Laterality: Left;  . PERIPHERALLY INSERTED CENTRAL CATHETER INSERTION    . SKIN GRAFT Right 06/15/2014  . SKIN GRAFT Left 06/2015   foot   . SKIN SPLIT GRAFT Right 06/15/2014    Procedure: SPLIT THICKNESS SKIN GRAFT RIGHT FOOT;  Surgeon: Newt Minion, MD;  Location: [facility];  Service: Orthopedics;  Laterality: Right;  . TRANSMETATARSAL AMPUTATION Right   . TRANSMETATARSAL AMPUTATION Right 09/11/2014    Family History  Problem Relation Age of Onset  . Hyperlipidemia Mother   . Dementia Mother     Social History:  reports that she quit smoking about 8 years ago. She quit after 15.00 years of use. She has never used smokeless tobacco. She reports that she drinks about 0.5 oz of alcohol per week . She reports that she does not use drugs.  Review of Systems:   Hypertension/CKD:    Hypertension  controlled with amlodipine 2.5 mg  Also has consistently decreased CO2 levels but did not have recurrence of hyperkalemia recently Also has mild hypocalcemia   Lab Results  Component Value Date   CREATININE 2.93 (H) 10/05/2016   BUN 60 (H) 10/05/2016   NA 128 (L) 10/05/2016   K 5.1 10/05/2016   CL 105 10/05/2016   CO2 14 (L) 10/05/2016     Lipids: Has been  prescribed pravastatin, She thinks she is taking this regularly  LDL controlled   Lab Results  Component Value Date   CHOL 139 05/02/2016   HDL 26.30 (L) 05/02/2016   LDLCALC 78 05/02/2016   LDLDIRECT 112.0 07/05/2014   TRIG 173.0 (H) 05/02/2016   CHOLHDL 5 05/02/2016       She has been treated by nephrologist for anemia Previously   Lab Results  Component Value Date   WBC 4.9 06/28/2016   HGB 13.2 06/28/2016   HCT 37.8 06/28/2016   MCV 83.8 06/28/2016   PLT 503 (H) 06/28/2016    She has a Charcot foot And has had amputation of Left fifth toe which  was infected She is now going to the podiatrist with some continued infection and recently on antibiotics also   Abnormal liver function tests: Etiology unclear, has not reported to PCP for this  Previously had nausea and was given Reglan, does not have any symptoms recently except rarely   Examination:   BP 122/82   Pulse 94   Ht 5\' 7"   (1.702 m)   Wt 122 lb 12.8 oz (55.7 kg)   SpO2 98%   BMI 19.23 kg/m   Body mass index is 19.23 kg/m.     ASSESSMENT/ PLAN:   Diabetes type 1 with poor control and multiple complications    Her diabetes is persistently poorly controlled Partly from noncompliance with all measures of self-care A1c is 11.6 and has been mostly over 11%   See history of present illness for details of her blood sugar patterns, current management and problems identified  She is getting inadequate insulin both basal and bolus but she has not been using her insulin pump in the last week and has not been regular with her follow-up Her diet is still usually not balanced and getting significant carbohydrate without consistent coverage with boluses Not bolusing daily also With her blood sugars being mostly over 400 and only occasionally coming down in the 200+ range with correction she does need significantly increased basal delivery She has been previously reluctant to increase basal rates because of fear of hypoglycemia  She will resume her insulin pump, hopefully he will get her supplies this week otherwise may need Lantus also  DIABETIC nephropathy with nephrotic syndrome: Her last creatinine was still significantly high and she has progression of her renal insufficiency Recommended urgent follow-up with nephrologist   PLAN:    Basal rate 0.4 at midnight, 0.5 at 8 AM, 0.55 at 6 PM  Carbohydrate coverage 1:15 and sensitivity 1:50 with target 120  Restart insulin pump right away when getting supplies  Check blood sugar at least 3-4 times a day regularly even if not eating and make sure she checks her blood sugars after doing correction boluses  Cut back on milk and other hyperglycemic index foods and drinks  She needs to call the nephrologist for follow-up, emphasized the need for regular care  Follow-up with PCP regarding abnormal liver functions Vitamin D supplementation deferred to  nephrologist  Counseling time on subjects discussed in assessment and plan sections is over 50% of today's 25 minute visit  There are no Patient Instructions on file for this visit.   Briauna Gilmartin 10/08/2016, 9:30 AM

## 2016-10-08 NOTE — Telephone Encounter (Signed)
[place] for patient and spoke to Judson Roch and she stated that her pump supplies are being shipped out tomorrow.

## 2016-10-10 ENCOUNTER — Ambulatory Visit (INDEPENDENT_AMBULATORY_CARE_PROVIDER_SITE_OTHER): Payer: Medicare Other | Admitting: Podiatry

## 2016-10-10 DIAGNOSIS — L97522 Non-pressure chronic ulcer of other part of left foot with fat layer exposed: Secondary | ICD-10-CM

## 2016-10-10 DIAGNOSIS — L97512 Non-pressure chronic ulcer of other part of right foot with fat layer exposed: Secondary | ICD-10-CM

## 2016-10-10 DIAGNOSIS — E0842 Diabetes mellitus due to underlying condition with diabetic polyneuropathy: Secondary | ICD-10-CM | POA: Diagnosis not present

## 2016-10-10 DIAGNOSIS — I70245 Atherosclerosis of native arteries of left leg with ulceration of other part of foot: Secondary | ICD-10-CM

## 2016-10-10 DIAGNOSIS — I70235 Atherosclerosis of native arteries of right leg with ulceration of other part of foot: Secondary | ICD-10-CM

## 2016-10-12 NOTE — Progress Notes (Signed)
   Subjective:  Patient with a history of diabetes mellitus and previous partial foot amputations presents today for follow-up evaluation of ulcers to bilateral feet. She states that there has not been much change in her ulcerations lately. She continues had an ulcer to the lateral aspect of the midfoot left lower extremity as well as an ulceration to the transmetatarsal amputation stump of the right lower extremity. Patient denies any significant changes since last visit.   Objective/Physical Exam General: The patient is alert and oriented x3 in no acute distress.  Dermatology:  Wound #1 noted to the lateral aspect of the left midfoot measuring approximately [phone] cm (LxWxD).   Wound #2 is noted to the right transmetatarsal amputation stump measuring approximately [phone] cm (LxWxD).   To the noted ulceration(s), there is no eschar. There is a moderate amount of slough, fibrin, and necrotic tissue noted. Granulation tissue and wound base is red. There is a minimal amount of serosanguineous drainage noted. There is no exposed bone muscle-tendon ligament or joint. There is no malodor. Periwound integrity is intact. Skin is warm, dry and supple bilateral lower extremities.  Vascular:  No edema or erythema noted. Capillary refill within normal limits.  Neurological: Epicritic and protective threshold absent bilaterally.   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 #2 wound was cleansed with normal saline #3 application of 9 cm of Integra primatrix skin graft was applied to the ulcers to a total wound measurement area of 3 cm. Please note this Is medically necessary. The patient has had the wound for greater than 6 weeks and is felt all conservative  measurements of treatment and satisfactory alleviation of symptoms for the patient. #4 the graft was fixated with Steri-Strips. Dry sterile dressings were applied. #5 a postoperative shoe was dispensed today for the right lower extremity. Continued immobilization cam boot to the left lower extremity #6 the patient just recently finished antibiotics Bactrim DS. No further need for antibiotics at the moment. #7 return to clinic in 2 weeks  Edrick Kins, DPM [facility]  Dr. Edrick Kins, [address]                                        [place], [place] [address]                Office [phone]  Fax [phone]

## 2016-10-14 ENCOUNTER — Encounter: Payer: Self-pay | Admitting: Podiatry

## 2016-10-15 ENCOUNTER — Ambulatory Visit (INDEPENDENT_AMBULATORY_CARE_PROVIDER_SITE_OTHER): Payer: Medicare Other

## 2016-10-15 ENCOUNTER — Ambulatory Visit (INDEPENDENT_AMBULATORY_CARE_PROVIDER_SITE_OTHER): Payer: Medicare Other | Admitting: Podiatry

## 2016-10-15 DIAGNOSIS — I70235 Atherosclerosis of native arteries of right leg with ulceration of other part of foot: Secondary | ICD-10-CM

## 2016-10-15 DIAGNOSIS — I70245 Atherosclerosis of native arteries of left leg with ulceration of other part of foot: Secondary | ICD-10-CM

## 2016-10-15 DIAGNOSIS — L97512 Non-pressure chronic ulcer of other part of right foot with fat layer exposed: Secondary | ICD-10-CM | POA: Diagnosis not present

## 2016-10-15 DIAGNOSIS — L97522 Non-pressure chronic ulcer of other part of left foot with fat layer exposed: Secondary | ICD-10-CM

## 2016-10-15 DIAGNOSIS — E0842 Diabetes mellitus due to underlying condition with diabetic polyneuropathy: Secondary | ICD-10-CM

## 2016-10-15 NOTE — Progress Notes (Signed)
   Subjective:  Patient with history of diabetes mellitus and multiple lateral foot amputations presents today for ulcers to the bilateral feet. Patient was last seen 10/10/2016 at which time skin graft was applied to the bilateral ulcers. Today she states that the significant amount of drainage with odor. Patient presents today for further treatment and evaluation   Objective/Physical Exam General: The patient is alert and oriented x3 in no acute distress.  Dermatology:  Wound #1 noted to the lateral aspect of the left midfoot measuring approximately [phone] cm (LxWxD).   Wound #2 is noted to the right transmetatarsal amputation stump measuring approximately [phone] cm (LxWxD).   To the noted ulceration(s), there is no eschar. There is a minimal amount of slough, fibrin, and necrotic tissue noted. Granulation tissue and wound base is red. There is a heavy amount of serosanguineous drainage noted. There is no exposed bone muscle-tendon ligament or joint. There is no malodor. Periwound integrity is macerated due to the heavy drainage. Skin is warm, dry and supple bilateral lower extremities.  Vascular:  No edema or erythema noted. Capillary refill within normal limits.  Neurological: Epicritic and protective threshold absent bilaterally.   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 #2 wound was cleansed with normal saline #3 medically necessary excisional debridement including subcutaneous tissues performed using a tissue nipper. Excisional debridement of all necrotic nonviable tissue down to healthy bleeding viable tissue was performed with post-debridement measurements same as pre-. #4 dry sterile dressings  were applied. #5 cultures were taken of the bilateral foot ulcers #6 patient is to continue Bactrim DS. She was prescribed some of the weekend when she called the on-call service at her office #7 continue daily dressing changes Betadine wet-to-dry dressings #8 orders for wound care supplies were placed today #9 return to clinic in 2 weeks  Edrick Kins, DPM [facility]  Dr. Edrick Kins, [address]                                        [place], [place] [address]                Office [phone]  Fax [phone]

## 2016-10-17 ENCOUNTER — Telehealth: Payer: Self-pay | Admitting: Endocrinology

## 2016-10-17 NOTE — Telephone Encounter (Signed)
This has been faxed to [facility]

## 2016-10-17 NOTE — Telephone Encounter (Signed)
[facility] needs the last labs for the patient faxed over to them at [phone] number).

## 2016-10-18 LAB — WOUND CULTURE
GRAM STAIN: NONE SEEN
GRAM STAIN: NONE SEEN
GRAM STAIN: NONE SEEN
Gram Stain: NONE SEEN

## 2016-10-22 ENCOUNTER — Telehealth: Payer: Self-pay | Admitting: Podiatry

## 2016-10-22 NOTE — Telephone Encounter (Signed)
I was calling because my kidney doctor told me to only take 1 antibiotic pill a day instead of 2 like [name] prescribed. I don't know if that changes anything.

## 2016-10-24 ENCOUNTER — Ambulatory Visit (INDEPENDENT_AMBULATORY_CARE_PROVIDER_SITE_OTHER): Payer: Medicare Other | Admitting: Podiatry

## 2016-10-24 DIAGNOSIS — L97512 Non-pressure chronic ulcer of other part of right foot with fat layer exposed: Secondary | ICD-10-CM

## 2016-10-24 DIAGNOSIS — L97522 Non-pressure chronic ulcer of other part of left foot with fat layer exposed: Secondary | ICD-10-CM

## 2016-10-24 DIAGNOSIS — I70235 Atherosclerosis of native arteries of right leg with ulceration of other part of foot: Secondary | ICD-10-CM

## 2016-10-24 DIAGNOSIS — E0842 Diabetes mellitus due to underlying condition with diabetic polyneuropathy: Secondary | ICD-10-CM

## 2016-10-24 DIAGNOSIS — I70245 Atherosclerosis of native arteries of left leg with ulceration of other part of foot: Secondary | ICD-10-CM

## 2016-10-25 ENCOUNTER — Telehealth: Payer: Self-pay | Admitting: Endocrinology

## 2016-10-25 NOTE — Telephone Encounter (Signed)
Patient says she was supposed to good blood sugar reading device from [facility], but has not heard anything back about it.  Thank you,  -LL

## 2016-10-25 NOTE — Telephone Encounter (Signed)
Called patient and she stated that she spoke with Vaughan Basta about getting a continuous glucose monitor and wanted to know the status of this.  Vaughan Basta, please advise? Thank you!

## 2016-10-27 NOTE — Progress Notes (Signed)
   Subjective:  Patient with history of diabetes mellitus and multiple lateral foot amputations presents today for ulcers to the bilateral feet. Patient presents today for follow-up treatment and evaluation and she feels that the ulcer in her right foot is throbbing and aching. Since last visit patient also states that her kidney doctor told her she can only take 1 antibiotics per day due to impaired renal function.  Objective/Physical Exam General: The patient is alert and oriented x3 in no acute distress.  Dermatology:  Wound #1 noted to the lateral aspect of the left midfoot measuring approximately [phone] cm (LxWxD).   Wound #2 is noted to the right transmetatarsal amputation stump measuring approximately 001.001.001.001 cm (LxWxD).   To the noted ulceration(s), there is no eschar. There is a minimal amount of slough, fibrin, and necrotic tissue noted. Granulation tissue and wound base is red. There is a heavy amount of serosanguineous drainage noted. There is no exposed bone muscle-tendon ligament or joint. There is no malodor. Periwound integrity is macerated due to the heavy drainage. Skin is warm, dry and supple bilateral lower extremities.  Vascular:  No edema or erythema noted. Capillary refill within normal limits.  Neurological: Epicritic and protective threshold absent bilaterally.   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 #2 wound was cleansed with normal saline #3 medically necessary excisional debridement including subcutaneous tissues performed using a tissue nipper. Excisional debridement of all necrotic nonviable tissue down to healthy bleeding viable tissue was performed with post-debridement  measurements same as pre-. #4 dry sterile dressings were applied. #5 as per the kidney doctor the patient is okay to take 1 antibiotics per day of Bactrim DS. #6 continue daily home health dressing changes at home. #7 return to clinic in 2 weeks  Edrick Kins, DPM [facility]  Dr. Edrick Kins, [address]                                        [place], [place] [address]                Office [phone]  Fax [phone]

## 2016-10-29 NOTE — Telephone Encounter (Signed)
Message left on machine to call me reference to CGM ordered. Pt. Immediately called me back saying that she wants the CGM with the alerts/alarms.  She did not care which brand.   Message left on Roxanne's phone to order the Guardian CGM for her pump.

## 2016-11-04 NOTE — Addendum Note (Signed)
Addendum  created 11/04/16 1027 by Duane Boston, MD   Sign clinical note

## 2016-11-04 NOTE — Interval H&P Note (Signed)
Anesthesia H&P Update: History and Physical Exam reviewed; patient is OK for planned anesthetic and procedure. ? ?

## 2016-11-05 ENCOUNTER — Telehealth: Payer: Self-pay

## 2016-11-05 NOTE — Telephone Encounter (Signed)
Faxed her pump supplies form from [facility] back to them today since it was filled out for her supplies.

## 2016-11-06 ENCOUNTER — Other Ambulatory Visit (INDEPENDENT_AMBULATORY_CARE_PROVIDER_SITE_OTHER): Payer: Medicare Other

## 2016-11-06 DIAGNOSIS — E1065 Type 1 diabetes mellitus with hyperglycemia: Secondary | ICD-10-CM

## 2016-11-06 LAB — COMPREHENSIVE METABOLIC PANEL
ALT: 84 U/L — ABNORMAL HIGH (ref 0–35)
AST: 107 U/L — ABNORMAL HIGH (ref 0–37)
Albumin: 3.3 g/dL — ABNORMAL LOW (ref 3.5–5.2)
Alkaline Phosphatase: 221 U/L — ABNORMAL HIGH (ref 39–117)
BUN: 52 mg/dL — AB (ref 6–23)
CHLORIDE: 107 meq/L (ref 96–112)
CO2: 16 meq/L — AB (ref 19–32)
Calcium: 6.7 mg/dL — ABNORMAL LOW (ref 8.4–10.5)
Creatinine, Ser: 2.74 mg/dL — ABNORMAL HIGH (ref 0.40–1.20)
GFR: 19.92 mL/min — ABNORMAL LOW (ref >60.00)
GLUCOSE: 278 mg/dL — AB (ref 70–99)
POTASSIUM: 4.6 meq/L (ref 3.5–5.1)
SODIUM: 134 meq/L — AB (ref 135–145)
TOTAL PROTEIN: 6.7 g/dL (ref 6.0–8.3)
Total Bilirubin: 0.2 mg/dL (ref 0.2–1.2)

## 2016-11-07 ENCOUNTER — Ambulatory Visit (INDEPENDENT_AMBULATORY_CARE_PROVIDER_SITE_OTHER): Payer: Medicare Other | Admitting: Podiatry

## 2016-11-07 DIAGNOSIS — E0842 Diabetes mellitus due to underlying condition with diabetic polyneuropathy: Secondary | ICD-10-CM

## 2016-11-07 DIAGNOSIS — L97512 Non-pressure chronic ulcer of other part of right foot with fat layer exposed: Secondary | ICD-10-CM | POA: Diagnosis not present

## 2016-11-07 DIAGNOSIS — I70245 Atherosclerosis of native arteries of left leg with ulceration of other part of foot: Secondary | ICD-10-CM

## 2016-11-07 DIAGNOSIS — I70235 Atherosclerosis of native arteries of right leg with ulceration of other part of foot: Secondary | ICD-10-CM

## 2016-11-07 DIAGNOSIS — L02612 Cutaneous abscess of left foot: Secondary | ICD-10-CM

## 2016-11-07 DIAGNOSIS — L97522 Non-pressure chronic ulcer of other part of left foot with fat layer exposed: Secondary | ICD-10-CM

## 2016-11-07 LAB — FRUCTOSAMINE: FRUCTOSAMINE: 360 umol/L — AB (ref 0–285)

## 2016-11-09 ENCOUNTER — Ambulatory Visit: Payer: Medicare Other | Admitting: Endocrinology

## 2016-11-09 ENCOUNTER — Telehealth: Payer: Self-pay | Admitting: Endocrinology

## 2016-11-09 DIAGNOSIS — Z0289 Encounter for other administrative examinations: Secondary | ICD-10-CM

## 2016-11-09 NOTE — Telephone Encounter (Signed)
Patient no showed today's appt. Please advise on how to follow up. °A. No follow up necessary. °B. Follow up urgent. Contact patient immediately. °C. Follow up necessary. Contact patient and schedule visit in ___ days. °D. Follow up advised. Contact patient and schedule visit in ____weeks. ° °

## 2016-11-12 ENCOUNTER — Encounter: Payer: Self-pay | Admitting: Endocrinology

## 2016-11-12 ENCOUNTER — Ambulatory Visit (INDEPENDENT_AMBULATORY_CARE_PROVIDER_SITE_OTHER): Payer: Medicare Other | Admitting: Endocrinology

## 2016-11-12 VITALS — BP 138/84 | HR 76 | Ht 67.0 in | Wt 131.8 lb

## 2016-11-12 DIAGNOSIS — I70235 Atherosclerosis of native arteries of right leg with ulceration of other part of foot: Secondary | ICD-10-CM

## 2016-11-12 DIAGNOSIS — E1065 Type 1 diabetes mellitus with hyperglycemia: Secondary | ICD-10-CM | POA: Diagnosis not present

## 2016-11-12 DIAGNOSIS — N184 Chronic kidney disease, stage 4 (severe): Secondary | ICD-10-CM | POA: Diagnosis not present

## 2016-11-12 DIAGNOSIS — E1022 Type 1 diabetes mellitus with diabetic chronic kidney disease: Secondary | ICD-10-CM

## 2016-11-12 NOTE — Patient Instructions (Signed)
Take Calcitriol

## 2016-11-12 NOTE — Progress Notes (Signed)
Patient ID: [name], female   DOB: Jan 09, 1971, 46 y.o.   MRN: [id number]   Reason for Appointment: follow-up of diabetes  History of Present Illness   Diagnosis: Type 1 DIABETES MELITUS  Previous history: She has had long-standing poorly controlled diabetes and typically has poor compliance with self care measures despite periodic diabetes education and periodic followup  INSULIN PUMP regimen:   BASAL rate 0.4 at midnight, 0.5 at 8 AM, 0.55 at 6 PM   Carb coverage 1: 15 g and correction 1: 50 with target 120, active insulin 6 hours         Recent history:   Her last A1c was 11.6  Current blood sugar patterns, difficulties with management and problems identified:  Blood sugars are again poorly controlled although on an average better than on her last visit  She is still not motivated to check her sugars enough  She is not bolusing when she is eating and she is eating randomly and mostly in the evenings  She says that if she takes more than 7 units of bolus at the time she will feel shaky but with the 7 unit bolus she does not appear to have any reactions  She is not entering her carbohydrates consistently and has done it only sporadically in the evenings between 5-8 PM and 1 at bedtime  Her diet is been inconsistent and she will eat fruits, cheese or yogurt at various times  She has done a few readings within 6 hours of her bolus and occasionally may have low normal readings with these boluses  However last night glucose was 61 after her bolus but not clear if she had eaten at that time also  She will not check her blood sugar at the time of her bolus for a meal usually and not clear if some of her boluses are related to food or correction  She is not changing her infusion set consistently and has not done it for 5 days now, blood sugar was over 400 yesterday  Still has periodic readings over 400 randomly but mostly at bedtime probably from not bolusing  for her evening meal  She was sometimes suspend her pump for several hours, occasionally when sugar is low normal   Mean values apply above for all meters except median for One Touch  PRE-MEAL Fasting Lunch Dinner Bedtime Overall  Glucose range: 40-400 +    61-400 +    Mean/median:     294+/-151    Meals: eating at variable times and also having snacks periodically.  Her daily carbohydrate intake is quite variable Physical activity: exercise: none         Dietician visit: Most recent:?          Complications: are: Neuropathy, nephropathy, diabetic foot ulcer      Wt Readings from Last 3 Encounters:  11/12/16 131 lb 12.8 oz (59.8 kg)  10/08/16 122 lb 12.8 oz (55.7 kg)  09/28/16 275 lb 9.2 oz (125 kg)   Lab Results  Component Value Date   HGBA1C 11.6 (H) 10/05/2016   HGBA1C 11.0 (H) 05/02/2016   HGBA1C 12.0 02/22/2016   Lab Results  Component Value Date   MICROALBUR 139.6 (H) 10/05/2016   LDLCALC 78 05/02/2016   CREATININE 2.74 (H) 11/06/2016    Other problems addressed:: See review of systems   Allergies as of 11/12/2016      Reactions   Cleocin [clindamycin Hcl] Diarrhea   Diarrhea  Lisinopril Other (See Comments)   Elevated potassium per pt report   Amoxicillin Diarrhea, Nausea Only   Bactrim [sulfamethoxazole-trimethoprim] Diarrhea, Nausea Only   Ciprofloxacin Itching      Medication List       Accurate as of 11/12/16  8:53 PM. Always use your most recent med list.          albuterol 108 (90 Base) MCG/ACT inhaler Commonly known as:  PROVENTIL HFA;VENTOLIN HFA Inhale 2 puffs into the lungs every 6 (six) hours as needed for wheezing or shortness of breath. Reported on 04/15/2015   amLODipine 2.5 MG tablet Commonly known as:  NORVASC Take 1 tablet (2.5 mg total) by mouth daily.   aspirin 325 MG tablet Take 325 mg by mouth daily.   calcitRIOL 0.5 MCG capsule Commonly known as:  ROCALTROL Take 1 capsule by mouth daily.   CALCIUM GUMMIES 250-100-500  MG-MG-UNIT Chew Generic drug:  Calcium-Phosphorus-Vitamin D Chew 2 each by mouth daily.   cephALEXin 500 MG capsule Commonly known as:  KEFLEX Take 1 capsule (500 mg total) by mouth 4 (four) times daily.   CINNAMON PO Take 1 tablet by mouth daily.   folic acid 1 MG tablet Commonly known as:  FOLVITE Take 1 mg by mouth daily.   furosemide 40 MG tablet Commonly known as:  LASIX Take 40 mg by mouth daily as needed for fluid.   gabapentin 300 MG capsule Commonly known as:  NEURONTIN Take 1 capsule (300 mg total) by mouth 2 (two) times daily.   glucose blood test strip Commonly known as:  BAYER CONTOUR NEXT TEST Use as instructed to check blood sugar 4 times per day dx code E10.65   HUMALOG 100 UNIT/ML injection Generic drug:  insulin lispro CALL MD IF <70, IF 151-200 =2UNITS,201-250 =4 UNITS,251-300 =6 UNITS, 301-350 =8 UNITS,351-400 =10 U   insulin pump Soln Inject into the skin. 15 carbs = 1 unit, every 50 sugars = 1 unit   metoCLOPramide 5 MG tablet Commonly known as:  REGLAN Take 1 tablet (5 mg total) by mouth 3 (three) times daily before meals.   norethindrone 5 MG tablet Commonly known as:  AYGESTIN Take 1 tablet by mouth See admin instructions. Take 1 tablet daily for 14 days and off 14 days.Philbert Riser BONE Caps Take 1 each by mouth daily.   ondansetron 4 MG disintegrating tablet Commonly known as:  ZOFRAN-ODT DISSOLVE 1 TABLET ON TONGUE EVERY 8 HOURS AS NEEDED FOR NAUSEA   pravastatin 40 MG tablet Commonly known as:  PRAVACHOL Take 1 tablet (40 mg total) by mouth daily.   Probiotic Caps Take 1 capsule by mouth daily.   pyridoxine 100 MG tablet Commonly known as:  B-6 Take 100 mg by mouth daily. Reported on 04/15/2015   traMADol 50 MG tablet Commonly known as:  ULTRAM Take 1 tablet (50 mg total) by mouth every 6 (six) hours as needed.   VISION PLUS Caps Take 1 each by mouth daily.   VITAMIN B12 PO Take 1 each by mouth daily.   vitamin C  500 MG tablet Commonly known as:  ASCORBIC ACID Take 500 mg by mouth daily.       Allergies:  Allergies  Allergen Reactions  . Cleocin [Clindamycin Hcl] Diarrhea    Diarrhea   . Lisinopril Other (See Comments)    Elevated potassium per pt report  . Amoxicillin Diarrhea and Nausea Only  . Bactrim [Sulfamethoxazole-Trimethoprim] Diarrhea and Nausea Only  . Ciprofloxacin Itching  Past Medical History:  Diagnosis Date  . Anemia   . C. difficile diarrhea 09/26/2014  . Cataracts, both eyes   . Cellulitis of right foot 06/02/2014  . CKD (chronic kidney disease) stage 3, GFR 30-59 ml/min    sees Dr Justin Mend- Stage IV  . Diabetic ulcer of right foot ([place]) 06/02/2014  . DVT (deep venous thrombosis) ([place]) 10/2014   one in each one in leg- PICC line   . H/O seasonal allergies   . Heart murmur    told once when she was pregnant- no furter mention- was in notes 11/2015- had Echo 8 /4/17  . History of blood transfusion   . Hypercholesteremia   . Hypertension   . Left foot infection   . Neuropathy    feet  . Neuropathy in diabetes ([place])   . Peripheral vascular disease ([place])   . Staphylococcus aureus bacteremia 10/2014  . Type 1 diabetes ([place])    onset age 20    Past Surgical History:  Procedure Laterality Date  . AMPUTATION TOE Left 07/24/2015   5th toe and Hallux amputation  . CESAREAN SECTION    . EXOSTECTECTOMY TOE Left 04/11/2016   Procedure: EXOSTECTECTOMY CUBOID AND 5TH METATARSAL AND PLACEMENT OF ANTIBIOTIC BEADS;  Surgeon: Edrick Kins, DPM;  Location: [facility];  Service: Podiatry;  Laterality: Left;  . EYE SURGERY Bilateral    [place]  . FEMORAL-POPLITEAL BYPASS GRAFT Left 05/03/2015  . FOOT AMPUTATION THROUGH METATARSAL Right 2016  . hemrrhoidectomy    . I&D EXTREMITY Right 06/10/2014   Procedure: IRRIGATION AND DEBRIDEMENT Right Foot;  Surgeon: Newt Minion, MD;  Location: [facility];  Service: Orthopedics;  Laterality: Right;  . INCISION AND DRAINAGE Left 04/11/2016   Procedure:  INCISION AND DRAINAGE A DEEP COMPLICATED WOUND OF LEFT FOOT;  Surgeon: Edrick Kins, DPM;  Location: [facility];  Service: Podiatry;  Laterality: Left;  . PERIPHERALLY INSERTED CENTRAL CATHETER INSERTION    . SKIN GRAFT Right 06/15/2014  . SKIN GRAFT Left 06/2015   foot   . SKIN SPLIT GRAFT Right 06/15/2014   Procedure: SPLIT THICKNESS SKIN GRAFT RIGHT FOOT;  Surgeon: Newt Minion, MD;  Location: [facility];  Service: Orthopedics;  Laterality: Right;  . TRANSMETATARSAL AMPUTATION Right   . TRANSMETATARSAL AMPUTATION Right 09/11/2014    Family History  Problem Relation Age of Onset  . Hyperlipidemia Mother   . Dementia Mother     Social History:  reports that she quit smoking about 8 years ago. She quit after 15.00 years of use. She has never used smokeless tobacco. She reports that she drinks about 0.5 oz of alcohol per week . She reports that she does not use drugs.  Review of Systems:   Hypertension/CKD:    Hypertension  controlled with amlodipine 2.5 mg  She has seen her nephrologist recently She does have hypocalcemia but she does not take her calcitriol regularly  Also has consistently decreased CO2 levels but did not have recurrence of hyperkalemia recently   Lab Results  Component Value Date   CREATININE 2.74 (H) 11/06/2016   BUN 52 (H) 11/06/2016   NA 134 (L) 11/06/2016   K 4.6 11/06/2016   CL 107 11/06/2016   CO2 16 (L) 11/06/2016     Lipids: Has been  prescribed pravastatin, She thinks she is taking this regularly  LDL controlled As of 1/18   Lab Results  Component Value Date   CHOL 139 05/02/2016   HDL 26.30 (L) 05/02/2016  LDLCALC 78 05/02/2016   LDLDIRECT 112.0 07/05/2014   TRIG 173.0 (H) 05/02/2016   CHOLHDL 5 05/02/2016       Has not had any follow-up for anemia, may have had this done through nephrologist  Lab Results  Component Value Date   WBC 4.9 06/28/2016   HGB 13.2 06/28/2016   HCT 37.8 06/28/2016   MCV 83.8 06/28/2016   PLT 503 (H)  06/28/2016    She has a Charcot foot And has had amputation of Left fifth toe which was infected She is now going to the podiatrist with some continued infection and recently off her Bactrim on the advice of day nephrologist     Examination:   BP 138/84   Pulse 76   Ht 5\' 7"  (1.702 m)   Wt 131 lb 12.8 oz (59.8 kg)   SpO2 98%   BMI 20.64 kg/m   Body mass index is 20.64 kg/m.     ASSESSMENT/ PLAN:   Diabetes type 1 with poor control and multiple complications    Her diabetes is persistently poorly controlled mostly from noncompliance with all measures of self-care A1c is 11.6 on the last visit and has been mostly over 11%   See history of present illness for details of her blood sugar patterns, current management and problems identified  She is getting inadequate insulin both basal and bolus but she has not been using her insulin pump in the last week and has not been regular with her follow-up Her diet is still usually not balanced and getting significant carbohydrate without consistent coverage with boluses Not bolusing daily also With her blood sugars being mostly over 400 and only occasionally coming down in the 200+ range with correction she does need significantly increased basal delivery She has been previously reluctant to increase basal rates because of fear of hypoglycemia  She will resume her insulin pump, hopefully he will get her supplies this week otherwise may need Lantus also  DIABETIC nephropathy with nephrotic syndrome: Now followed by nephrologist  Hypocalcemia: She is not taking her calcitriol regularly, currently asymptomatic   PLAN:    Basal rate 0.45 between 12 no0n--4 PM and then 0.6  Sensitivity 1:60 and target 150  She needs to start entering her carbohydrate for every meal and large snack in the pump so that she can bolus appropriately  She will check her blood sugars at time of the bolus for meals  More frequent glucose monitoring, also  to check blood sugars in follow-up when blood sugars are high  Change infusion set consistently twice a week  She will call if she has any unexpected hypoglycemia  Follow-up with PCP regarding multiple general issues  Renal insufficiency and vitamin D deficiency: Discussed that she needs to take her calcitriol consistently because of lack of active vitamin D and resulting mild hypocalcemia  Counseling time on subjects discussed in assessment and plan sections is over 50% of today's 25 minute visit  Patient Instructions  Take Calcitriol     Emit Kuenzel 11/12/2016, 8:53 PM

## 2016-11-13 NOTE — Progress Notes (Signed)
   Subjective:  Patient with history of diabetes mellitus and multiple lateral foot amputations presents today for ulcers to the bilateral feet. The patient is been changing the dressings daily at home. She is concerned about some right lower extremity warmth which began just a few days prior.   Objective/Physical Exam General: The patient is alert and oriented x3 in no acute distress.  Dermatology:  Wound #1 noted to the lateral aspect of the left midfoot measuring approximately [phone] cm (LxWxD).   Wound #2 is noted to the right transmetatarsal amputation stump measuring approximately  2.52.50.3cm (LxWxD).   To the noted ulceration(s), there is no eschar. There is a minimal amount of slough, fibrin, and necrotic tissue noted. Granulation tissue and wound base is red. There is a heavy amount of serosanguineous drainage noted. There is no exposed bone muscle-tendon ligament or joint. There is no malodor. Periwound integrity is macerated due to the heavy drainage. Skin is warm, dry and supple bilateral lower extremities.  Vascular:  No edema or erythema noted. Capillary refill within normal limits.There is no significant changes in temperature compared to the contralateral limb of the right lower extremity   Neurological: Epicritic and protective threshold absent bilaterally.   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 continue daily dressing changes at home  #2 wound was cleansed with normal saline #3 medically necessary excisional debridement includinmuscle and deep fasciales performed using a tissue nipper. Excisional debridement of all necrotic nonviable tissue down to healthy bleeding viable tissue was  performed with post-debridement measurements same as pre-. #4 dry sterile dressings were applied. #5 as per the kidney doctor the patient is okay to take 1 antibiotics per day of Bactrim DS. continue Bactrim daily  #6 today were going to arrange an appointment with Liliane Channel, Pedorthist for custom molded insoles and diabetic shoes. The patient may benefit from bilateral crow boots. At the moment recommended the patient continue postoperative shoe and cam boot to the bilateral lower extremities  #7 return to clinic in 2 weeks  Edrick Kins, DPM [facility]  Dr. Edrick Kins, [address]                                        [place], [place] [address]                Office [phone]  Fax [phone]

## 2016-11-28 ENCOUNTER — Ambulatory Visit (INDEPENDENT_AMBULATORY_CARE_PROVIDER_SITE_OTHER): Payer: Medicare Other | Admitting: Podiatry

## 2016-11-28 DIAGNOSIS — L97522 Non-pressure chronic ulcer of other part of left foot with fat layer exposed: Secondary | ICD-10-CM

## 2016-11-28 DIAGNOSIS — L97512 Non-pressure chronic ulcer of other part of right foot with fat layer exposed: Secondary | ICD-10-CM | POA: Diagnosis not present

## 2016-11-28 DIAGNOSIS — I70245 Atherosclerosis of native arteries of left leg with ulceration of other part of foot: Secondary | ICD-10-CM

## 2016-11-28 DIAGNOSIS — I70235 Atherosclerosis of native arteries of right leg with ulceration of other part of foot: Secondary | ICD-10-CM

## 2016-11-28 DIAGNOSIS — E0842 Diabetes mellitus due to underlying condition with diabetic polyneuropathy: Secondary | ICD-10-CM

## 2016-11-28 NOTE — Progress Notes (Signed)
   Subjective:  Patient with history of diabetes mellitus and multiple lateral foot amputations presents today for ulcers to the bilateral feet.  Patient states that her home health nurse is currently out of town and she is changing the dressings on her own. She states that she feels very well and denies any new complaints. She is been wearing the immobilization cam boot and postoperative shoe daily.   Objective/Physical Exam General: The patient is alert and oriented x3 in no acute distress.  Dermatology:  Wound #1 noted to the lateral aspect of the left midfoot measuring approximately [phone] cm (LxWxD).   Wound #2 is noted to the right transmetatarsal amputation stump measuring approximately  2.32.30.2cm (LxWxD).   To the noted ulceration(s), there is no eschar. There is a minimal amount of slough, fibrin, and necrotic tissue noted. Granulation tissue and wound base is red. There is Minimal serosanguineous drainage noted. There is no exposed bone muscle-tendon ligament or joint. There is no malodor. Periwound integrity is  Callus.Skin is warm, dry and supple bilateral lower extremities.  Vascular:  No edema or erythema noted. Capillary refill within normal limits.There is no significant changes in temperature compared to the contralateral limb of the right lower extremity   Neurological: Epicritic and protective threshold absent bilaterally.   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 continue daily dressing changes at home  #2 wound was cleansed with normal saline #3 medically necessary excisional debridement includinmuscle and deep fasciales performed using a tissue nipper. Excisional debridement of all  necrotic nonviable tissue down to healthy bleeding viable tissue was performed with post-debridement measurements same as pre-. #4 dry sterile dressings were applied. #5  Patient can discontinue taking oral antibiotic. Today there is no sign of infection or heavy drainage.  #6 continue postoperative shoe and cam boot to the bilateral lower extremities  #7  Appointment with Liliane Channel or Benjie Karvonen, pedorthist,  For  Possible crow boots for diabetic shoes with Plastizote insoles  #8 return to clinic in 3 weeks  Edrick Kins, DPM [facility]  Dr. Edrick Kins, [address]                                        [address], [place] [address]                Office [phone]  Fax [phone]

## 2016-12-05 ENCOUNTER — Ambulatory Visit: Payer: Medicare Other

## 2016-12-12 ENCOUNTER — Emergency Department (HOSPITAL_BASED_OUTPATIENT_CLINIC_OR_DEPARTMENT_OTHER): Payer: Medicare Other

## 2016-12-12 ENCOUNTER — Encounter (HOSPITAL_BASED_OUTPATIENT_CLINIC_OR_DEPARTMENT_OTHER): Payer: Self-pay

## 2016-12-12 ENCOUNTER — Emergency Department (HOSPITAL_BASED_OUTPATIENT_CLINIC_OR_DEPARTMENT_OTHER)
Admission: EM | Admit: 2016-12-12 | Discharge: 2016-12-13 | Disposition: A | Discharge status: Home / Self Care | Payer: Medicare Other | Attending: Emergency Medicine | Admitting: Emergency Medicine

## 2016-12-12 DIAGNOSIS — I129 Hypertensive chronic kidney disease with stage 1 through stage 4 chronic kidney disease, or unspecified chronic kidney disease: Secondary | ICD-10-CM | POA: Diagnosis not present

## 2016-12-12 DIAGNOSIS — M79604 Pain in right leg: Secondary | ICD-10-CM | POA: Diagnosis present

## 2016-12-12 DIAGNOSIS — R509 Fever, unspecified: Secondary | ICD-10-CM | POA: Diagnosis not present

## 2016-12-12 DIAGNOSIS — N183 Chronic kidney disease, stage 3 (moderate): Secondary | ICD-10-CM | POA: Diagnosis not present

## 2016-12-12 DIAGNOSIS — Z7902 Long term (current) use of antithrombotics/antiplatelets: Secondary | ICD-10-CM | POA: Diagnosis not present

## 2016-12-12 DIAGNOSIS — Z794 Long term (current) use of insulin: Secondary | ICD-10-CM | POA: Diagnosis not present

## 2016-12-12 DIAGNOSIS — R102 Pelvic and perineal pain: Secondary | ICD-10-CM | POA: Diagnosis not present

## 2016-12-12 DIAGNOSIS — N760 Acute vaginitis: Secondary | ICD-10-CM | POA: Insufficient documentation

## 2016-12-12 DIAGNOSIS — M79605 Pain in left leg: Secondary | ICD-10-CM | POA: Insufficient documentation

## 2016-12-12 DIAGNOSIS — L03115 Cellulitis of right lower limb: Secondary | ICD-10-CM | POA: Diagnosis not present

## 2016-12-12 DIAGNOSIS — R1030 Lower abdominal pain, unspecified: Secondary | ICD-10-CM

## 2016-12-12 DIAGNOSIS — E1040 Type 1 diabetes mellitus with diabetic neuropathy, unspecified: Secondary | ICD-10-CM | POA: Insufficient documentation

## 2016-12-12 DIAGNOSIS — Z7982 Long term (current) use of aspirin: Secondary | ICD-10-CM | POA: Insufficient documentation

## 2016-12-12 DIAGNOSIS — Z87891 Personal history of nicotine dependence: Secondary | ICD-10-CM | POA: Diagnosis not present

## 2016-12-12 DIAGNOSIS — B9689 Other specified bacterial agents as the cause of diseases classified elsewhere: Secondary | ICD-10-CM

## 2016-12-12 LAB — CBC WITH DIFFERENTIAL/PLATELET
BASOS PCT: 0 %
Basophils Absolute: 0 10*3/uL (ref 0.0–0.1)
Eosinophils Absolute: 0.1 10*3/uL (ref 0.0–0.7)
Eosinophils Relative: 1 %
HEMATOCRIT: 29.5 % — AB (ref 36.0–46.0)
HEMOGLOBIN: 9.8 g/dL — AB (ref 12.0–15.0)
LYMPHS ABS: 0.7 10*3/uL (ref 0.7–4.0)
LYMPHS PCT: 5 %
MCH: 27.5 pg (ref 26.0–34.0)
MCHC: 33.2 g/dL (ref 30.0–36.0)
MCV: 82.9 fL (ref 78.0–100.0)
MONO ABS: 0.9 10*3/uL (ref 0.1–1.0)
MONOS PCT: 6 %
NEUTROS ABS: 11.7 10*3/uL — AB (ref 1.7–7.7)
NEUTROS PCT: 88 %
Platelets: 469 10*3/uL — ABNORMAL HIGH (ref 150–400)
RBC: 3.56 MIL/uL — ABNORMAL LOW (ref 3.87–5.11)
RDW: 13.8 % (ref 11.5–15.5)
WBC: 13.4 10*3/uL — ABNORMAL HIGH (ref 4.0–10.5)

## 2016-12-12 LAB — COMPREHENSIVE METABOLIC PANEL
ALBUMIN: 2.9 g/dL — AB (ref 3.5–5.0)
ALT: 44 U/L (ref 14–54)
ANION GAP: 9 (ref 5–15)
AST: 31 U/L (ref 15–41)
Alkaline Phosphatase: 239 U/L — ABNORMAL HIGH (ref 38–126)
BUN: 46 mg/dL — ABNORMAL HIGH (ref 6–20)
CHLORIDE: 103 mmol/L (ref 101–111)
CO2: 18 mmol/L — AB (ref 22–32)
Calcium: 7 mg/dL — ABNORMAL LOW (ref 8.9–10.3)
Creatinine, Ser: 2.02 mg/dL — ABNORMAL HIGH (ref 0.44–1.00)
GFR calc Af Amer: 33 mL/min — ABNORMAL LOW (ref >60)
GFR calc non Af Amer: 29 mL/min — ABNORMAL LOW (ref >60)
GLUCOSE: 175 mg/dL — AB (ref 65–99)
POTASSIUM: 5.5 mmol/L — AB (ref 3.5–5.1)
SODIUM: 130 mmol/L — AB (ref 135–145)
Total Bilirubin: 0.5 mg/dL (ref 0.3–1.2)
Total Protein: 6.9 g/dL (ref 6.5–8.1)

## 2016-12-12 LAB — URINALYSIS, MICROSCOPIC (REFLEX)
RBC / HPF: NONE SEEN RBC/hpf (ref 0–5)
WBC UA: NONE SEEN WBC/hpf (ref 0–5)

## 2016-12-12 LAB — WET PREP, GENITAL
Sperm: NONE SEEN
TRICH WET PREP: NONE SEEN
YEAST WET PREP: NONE SEEN

## 2016-12-12 LAB — I-STAT CG4 LACTIC ACID, ED: Lactic Acid, Venous: 1.33 mmol/L (ref 0.5–1.9)

## 2016-12-12 LAB — URINALYSIS, ROUTINE W REFLEX MICROSCOPIC
Bilirubin Urine: NEGATIVE
Ketones, ur: NEGATIVE mg/dL
LEUKOCYTES UA: NEGATIVE
Nitrite: NEGATIVE
SPECIFIC GRAVITY, URINE: 1.025 (ref 1.005–1.030)
pH: 6 (ref 5.0–8.0)

## 2016-12-12 LAB — LIPASE, BLOOD: Lipase: 31 U/L (ref 11–51)

## 2016-12-12 LAB — CBG MONITORING, ED: Glucose-Capillary: 165 mg/dL — ABNORMAL HIGH (ref 65–99)

## 2016-12-12 LAB — PREGNANCY, URINE: PREG TEST UR: NEGATIVE

## 2016-12-12 MED ORDER — DOXYCYCLINE HYCLATE 100 MG PO CAPS: 100.0000 mg | ORAL_CAPSULE | Freq: Two times a day (BID) | ORAL | 0 refills | Status: AC

## 2016-12-12 MED ORDER — CEFTRIAXONE SODIUM 250 MG IJ SOLR
250.0000 mg | Freq: Once | INTRAMUSCULAR | Status: AC
  Administered 2016-12-13: 250 mg via INTRAMUSCULAR
  Filled 2016-12-12: qty 250

## 2016-12-12 MED ORDER — ACETAMINOPHEN 325 MG PO TABS
650.0000 mg | ORAL_TABLET | Freq: Once | ORAL | Status: AC
  Administered 2016-12-12: 650 mg via ORAL
  Filled 2016-12-12: qty 2

## 2016-12-12 MED ORDER — DOXYCYCLINE HYCLATE 100 MG PO TABS
100.0000 mg | ORAL_TABLET | Freq: Once | ORAL | Status: AC
  Administered 2016-12-12: 100 mg via ORAL
  Filled 2016-12-12: qty 1

## 2016-12-12 MED ORDER — METRONIDAZOLE 500 MG PO TABS: 500.0000 mg | ORAL_TABLET | Freq: Two times a day (BID) | ORAL | 0 refills | Status: DC

## 2016-12-12 MED ORDER — AZITHROMYCIN 250 MG PO TABS
1000.0000 mg | ORAL_TABLET | Freq: Once | ORAL | Status: AC
  Administered 2016-12-13: 1000 mg via ORAL
  Filled 2016-12-12: qty 4

## 2016-12-12 NOTE — ED Notes (Signed)
Pt states she has not been told she cant take tylenol-tylenol given for fever

## 2016-12-12 NOTE — Discharge Instructions (Signed)
You may have diarrhea from the antibiotics.  It is very important that you continue to take the antibiotics even if you get diarrhea unless a medical professional tells you that you may stop taking them.  If you stop too early the bacteria you are being treated for will become stronger and you may need different, more powerful antibiotics that have more side effects and worsening diarrhea.  Please stay well hydrated and consider probiotics as they may decrease the severity of your diarrhea.  Please be aware that if you take any hormonal contraception (birth control pills, nexplanon, the ring, etc) that your birth control will not work while you are taking antibiotics and you need to use back up protection as directed on the birth control medication information insert.   Today you have been treated for gonorrhea and chlamydia.  The test to determine if you have these will take a few days. They will only call you if your tests come back positive, no news is good news. In the result that your tests are positive you have already been treated.  Today your diagnosed with bacterial vaginosis and received a prescription for metronidazole also known as Flagyl. It is very important that you do not consume any alcohol while taking this medication as it will cause you to become violently ill.

## 2016-12-12 NOTE — ED Triage Notes (Signed)
C/o bilat eg pain x 6 months-wearing post op trpy shoes to both feet-also c/o lower abd pain x 1 week-nausea-NAD-steady gait-states she is unsure if she can take fever meds due to renal disorder

## 2016-12-12 NOTE — ED Notes (Signed)
CBG is 165

## 2016-12-12 NOTE — ED Notes (Signed)
ED Provider at bedside. 

## 2016-12-12 NOTE — ED Provider Notes (Signed)
[facility] DEPT Provider Note   CSN: [id number] Arrival date & time: 12/12/16  1927     History   Chief Complaint Chief Complaint  Patient presents with  . Leg Problem  . Abdominal Pain    HPI [name] is a 46 y.o. female with a history of type 1 diabetes, neuropathy, diabetic ulcers in her bilateral feet, CKD stage III, DVT and blood clots who presents today for evaluation of generally not feeling well, nausea, and increased pain in her bilateral feet. She reports that she has only had blood clots when she has had a PICC line, she has not had a PICC line for "a while."  She reports that she sees [name] for her bilateral feet, and Dr. Dwyane Dee for her DM1.  She has had multiple partial amputations of both of her feet.  She has a wound care nurse who comes multiple times a week to help her change the wounds on her feet. She reports that she has no feeling in her feet.  She also reports that for the past week she has had some pelvic pain, denies any urinary symptoms, no burning/stinging with urination.  HPI  Past Medical History:  Diagnosis Date  . Anemia   . C. difficile diarrhea 09/26/2014  . Cataracts, both eyes   . Cellulitis of right foot 06/02/2014  . CKD (chronic kidney disease) stage 3, GFR 30-59 ml/min    sees Dr Justin Mend- Stage IV  . Diabetic ulcer of right foot ([place]) 06/02/2014  . DVT (deep venous thrombosis) ([place]) 10/2014   one in each one in leg- PICC line   . H/O seasonal allergies   . Heart murmur    told once when she was pregnant- no furter mention- was in notes 11/2015- had Echo 8 /4/17  . History of blood transfusion   . Hypercholesteremia   . Hypertension   . Left foot infection   . Neuropathy    feet  . Neuropathy in diabetes ([place])   . Peripheral vascular disease ([place])   . Staphylococcus aureus bacteremia 10/2014  . Type 1 diabetes [facility])    onset age 28    Patient Active Problem List   Diagnosis Date Noted  . Boil of scalp 11/04/2015  . Boil of  left cheek 11/04/2015  . Boil of lower abdomen 11/04/2015  . MRSA cellulitis 11/04/2015  . Hyponatremia 11/03/2015  . Bacterial folliculitis 62/83/1517  . Heart murmur 11/03/2015  . Protein-calorie malnutrition, moderate ([place]) 10/18/2014  . Left leg DVT ([place]) 10/18/2014  . DVT of upper extremity (deep vein thrombosis) ([place])   . Staphylococcus aureus bacteremia   . Cellulitis of left foot 10/13/2014  . Type 1 diabetes mellitus with hyperglycemia ([place]) 10/13/2014  . Chronic anemia 10/13/2014  . Hyperkalemia 10/06/2014  . Bilateral lower extremity edema 10/04/2014  . Status post transmetatarsal amputation of right foot ([place]) 09/26/2014  . Diabetic ulcer of left foot associated with type 1 diabetes mellitus ([place]) 09/26/2014  . Acute kidney injury superimposed on chronic kidney disease ([place]) 06/13/2014  . DM type 1 causing renal disease ([place]) 06/13/2013  . Osteomyelitis ([place]) 06/12/2013  . CKD (chronic kidney disease), stage III 06/12/2013  . UTI (urinary tract infection) 03/25/2013  . CAP (community acquired pneumonia) 03/25/2013  . Acute blood loss anemia 03/25/2013  . Acidosis 03/25/2013  . Leukocytosis, unspecified 03/25/2013  . Iron deficiency anemia 05/09/2011  . Charcot foot due to diabetes mellitus ([place]) 05/09/2011  . Foot ulcer due to secondary  DM ([place]) 05/09/2011  . Type 1 diabetes mellitus with neurological manifestations, uncontrolled ([place])   . Hypertension   . Hypercholesteremia     Past Surgical History:  Procedure Laterality Date  . AMPUTATION TOE Left 07/24/2015   5th toe and Hallux amputation  . CESAREAN SECTION    . EXOSTECTECTOMY TOE Left 04/11/2016   Procedure: EXOSTECTECTOMY CUBOID AND 5TH METATARSAL AND PLACEMENT OF ANTIBIOTIC BEADS;  Surgeon: Edrick Kins, DPM;  Location: [facility];  Service: Podiatry;  Laterality: Left;  . EYE SURGERY Bilateral    [place]  . FEMORAL-POPLITEAL BYPASS GRAFT Left 05/03/2015  . FOOT AMPUTATION THROUGH METATARSAL Right 2016  .  hemrrhoidectomy    . I&D EXTREMITY Right 06/10/2014   Procedure: IRRIGATION AND DEBRIDEMENT Right Foot;  Surgeon: Newt Minion, MD;  Location: [facility];  Service: Orthopedics;  Laterality: Right;  . INCISION AND DRAINAGE Left 04/11/2016   Procedure: INCISION AND DRAINAGE A DEEP COMPLICATED WOUND OF LEFT FOOT;  Surgeon: Edrick Kins, DPM;  Location: [facility];  Service: Podiatry;  Laterality: Left;  . PERIPHERALLY INSERTED CENTRAL CATHETER INSERTION    . SKIN GRAFT Right 06/15/2014  . SKIN GRAFT Left 06/2015   foot   . SKIN SPLIT GRAFT Right 06/15/2014   Procedure: SPLIT THICKNESS SKIN GRAFT RIGHT FOOT;  Surgeon: Newt Minion, MD;  Location: [facility];  Service: Orthopedics;  Laterality: Right;  . TRANSMETATARSAL AMPUTATION Right   . TRANSMETATARSAL AMPUTATION Right 09/11/2014    OB History    Gravida Para Term Preterm AB Living   1             SAB TAB Ectopic Multiple Live Births                   Home Medications    Prior to Admission medications   Medication Sig Start Date End Date Taking? Authorizing Provider  albuterol (PROVENTIL HFA;VENTOLIN HFA) 108 (90 BASE) MCG/ACT inhaler Inhale 2 puffs into the lungs every 6 (six) hours as needed for wheezing or shortness of breath. Reported on 04/15/2015    [provider]  amLODipine (NORVASC) 2.5 MG tablet Take 1 tablet (2.5 mg total) by mouth daily. 08/02/16   Elayne Snare, MD  aspirin 325 MG tablet Take 325 mg by mouth daily.    [provider]  calcitRIOL (ROCALTROL) 0.5 MCG capsule Take 1 capsule by mouth daily. 03/13/16   [provider]  Calcium-Phosphorus-Vitamin D (CALCIUM GUMMIES) [phone] MG-MG-UNIT CHEW Chew 2 each by mouth daily.    [provider]  cephALEXin (KEFLEX) 500 MG capsule Take 1 capsule (500 mg total) by mouth 4 (four) times daily. Patient not taking: Reported on 10/08/2016 09/29/16   Ward, Delice Bison, DO  CINNAMON PO Take 1 tablet by mouth daily.    [provider]  Cyanocobalamin  (VITAMIN B12 PO) Take 1 each by mouth daily.    [provider]  doxycycline (VIBRAMYCIN) 100 MG capsule Take 1 capsule (100 mg total) by mouth 2 (two) times daily. 12/12/16 12/19/16  Lorin Glass, PA-C  folic acid (FOLVITE) 1 MG tablet Take 1 mg by mouth daily.    [provider]  furosemide (LASIX) 40 MG tablet Take 40 mg by mouth daily as needed for fluid.     [provider]  gabapentin (NEURONTIN) 300 MG capsule Take 1 capsule (300 mg total) by mouth 2 (two) times daily. Patient not taking: Reported on 11/12/2016 09/10/16   Edrick Kins, DPM  glucose blood (BAYER CONTOUR NEXT TEST) test strip Use as instructed to check blood sugar 4 times per day dx code E10.65 12/07/15   Elayne Snare, MD  HUMALOG 100 UNIT/ML injection CALL MD IF <70, IF 151-200 =2UNITS,201-250 =4 UNITS,251-300 =6 UNITS, 301-350 =8 UNITS,351-400 =10 U 08/16/16   Elayne Snare, MD  Insulin Human (INSULIN PUMP) SOLN Inject into the skin. 15 carbs = 1 unit, every 50 sugars = 1 unit    [provider]  metoCLOPramide (REGLAN) 5 MG tablet Take 1 tablet (5 mg total) by mouth 3 (three) times daily before meals. Patient not taking: Reported on 10/08/2016 06/26/16   Elayne Snare, MD  metroNIDAZOLE (FLAGYL) 500 MG tablet Take 1 tablet (500 mg total) by mouth 2 (two) times daily. 12/12/16   Lorin Glass, PA-C  Multiple Minerals-Vitamins (NUTRA-SUPPORT BONE) CAPS Take 1 each by mouth daily.    [provider]  Multiple Vitamins-Minerals (VISION PLUS) CAPS Take 1 each by mouth daily.    [provider]  norethindrone (AYGESTIN) 5 MG tablet Take 1 tablet by mouth See admin instructions. Take 1 tablet daily for 14 days and off 14 days..    [provider]  ondansetron (ZOFRAN-ODT) 4 MG disintegrating tablet DISSOLVE 1 TABLET ON TONGUE EVERY 8 HOURS AS NEEDED FOR NAUSEA 04/23/16   [provider]  pravastatin (PRAVACHOL) 40 MG tablet Take 1 tablet (40 mg total) by mouth  daily. 08/15/16   Elayne Snare, MD  Probiotic CAPS Take 1 capsule by mouth daily. Patient not taking: Reported on 10/08/2016 09/29/16   Ward, Delice Bison, DO  pyridoxine (B-6) 100 MG tablet Take 100 mg by mouth daily. Reported on 04/15/2015    [provider]  traMADol (ULTRAM) 50 MG tablet Take 1 tablet (50 mg total) by mouth every 6 (six) hours as needed. Patient not taking: Reported on 11/12/2016 09/29/16   Ward, Delice Bison, DO  vitamin C (ASCORBIC ACID) 500 MG tablet Take 500 mg by mouth daily.    [provider]    Family History Family History  Problem Relation Age of Onset  . Hyperlipidemia Mother   . Dementia Mother     Social History Social History  Substance Use Topics  . Smoking status: Former Smoker    Years: 15.00    Quit date: 05/16/2008  . Smokeless tobacco: Never Used  . Alcohol use Yes     Comment: Occasional     Allergies   Cleocin [clindamycin hcl]; Lisinopril; Amoxicillin; Bactrim [sulfamethoxazole-trimethoprim]; and Ciprofloxacin   Review of Systems Review of Systems  Constitutional: Positive for fatigue and fever. Negative for chills.  HENT: Negative for ear pain and sore throat.   Eyes: Negative for pain and visual disturbance.  Respiratory: Negative for cough and shortness of breath.   Cardiovascular: Negative for chest pain and palpitations.  Gastrointestinal: Positive for abdominal pain and nausea. Negative for vomiting.  Genitourinary: Positive for pelvic pain. Negative for difficulty urinating, dysuria, frequency, hematuria, urgency, vaginal bleeding and vaginal pain.  Musculoskeletal: Negative for arthralgias, back pain and neck pain.  Skin: Negative for color change and rash.  Neurological: Negative for seizures and syncope.  All other systems reviewed and are negative.    Physical Exam Updated Vital Signs BP 122/74   Pulse 82   Temp 99.1 F (37.3 C) (Oral)   Resp 18   Ht 5\' 7"  (1.702 m)   Wt 56.7 kg (125 lb)   LMP  11/18/2016   SpO2 96%  BMI 19.58 kg/m   Physical Exam  Constitutional: She is oriented to person, place, and time. She appears well-developed and well-nourished. No distress.  HENT:  Head: Normocephalic and atraumatic.  Mouth/Throat: Oropharynx is clear and moist.  Eyes: Conjunctivae are normal.  Neck: Normal range of motion. Neck supple.  Cardiovascular: Normal rate, regular rhythm, normal heart sounds and intact distal pulses.  Exam reveals no friction rub.   No murmur heard. Pulmonary/Chest: Effort normal and breath sounds normal. No respiratory distress. She has no wheezes. She has no rales.  Abdominal: Soft. Bowel sounds are normal. She exhibits no distension. There is no tenderness.  Genitourinary: Vagina normal and uterus normal. Pelvic exam was performed with patient supine. There is no rash on the right labia. There is no rash on the left labia. Uterus is not tender. Cervix exhibits no motion tenderness and no discharge. Right adnexum displays no mass, no tenderness and no fullness. Left adnexum displays no mass, no tenderness and no fullness. No erythema or bleeding in the vagina. No vaginal discharge found.  Musculoskeletal: Normal range of motion. She exhibits no edema.  Neurological: She is alert and oriented to person, place, and time. A sensory deficit (Decreased sensation over her bilateral feet, patient states this is consistent with her normal and unchanged.) is present.  Skin: Skin is warm and dry.  Chronic appearing ulcers on bilateral feet area and there are multiple partial amputations to both feet.  There is redness along bilateral lower extremities and warmth. There is more redness and warmth on the right lower extremity than the left. +1 pitting edema bilaterally.  Psychiatric: She has a normal mood and affect.  Nursing note and vitals reviewed.    ED Treatments / Results  Labs (all labs ordered are listed, but only abnormal results are displayed) Labs Reviewed    WET PREP, GENITAL - Abnormal; Notable for the following:       Result Value   Clue Cells Wet Prep HPF POC PRESENT (*)    WBC, Wet Prep HPF POC MODERATE (*)    All other components within normal limits  COMPREHENSIVE METABOLIC PANEL - Abnormal; Notable for the following:    Sodium 130 (*)    Potassium 5.5 (*)    CO2 18 (*)    Glucose, Bld 175 (*)    BUN 46 (*)    Creatinine, Ser 2.02 (*)    Calcium 7.0 (*)    Albumin 2.9 (*)    Alkaline Phosphatase 239 (*)    GFR calc non Af Amer 29 (*)    GFR calc Af Amer 33 (*)    All other components within normal limits  CBC WITH DIFFERENTIAL/PLATELET - Abnormal; Notable for the following:    WBC 13.4 (*)    RBC 3.56 (*)    Hemoglobin 9.8 (*)    HCT 29.5 (*)    Platelets 469 (*)    Neutro Abs 11.7 (*)    All other components within normal limits  URINALYSIS, ROUTINE W REFLEX MICROSCOPIC - Abnormal; Notable for the following:    APPearance CLOUDY (*)    Glucose, UA >=500 (*)    Hgb urine dipstick SMALL (*)    Protein, ur >300 (*)    All other components within normal limits  URINALYSIS, MICROSCOPIC (REFLEX) - Abnormal; Notable for the following:    Bacteria, UA FEW (*)    Squamous Epithelial / LPF 6-30 (*)    All other components within normal limits  CBG MONITORING,  ED - Abnormal; Notable for the following:    Glucose-Capillary 165 (*)    All other components within normal limits  CULTURE, BLOOD (SINGLE)  URINE CULTURE  LIPASE, BLOOD  PREGNANCY, URINE  I-STAT CG4 LACTIC ACID, ED  GC/CHLAMYDIA PROBE AMP ([place]) NOT AT [facility]    EKG  EKG Interpretation None       Radiology Dg Foot Complete Left  Result Date: 12/12/2016 CLINICAL DATA:  Bilateral foot wound infection with right foot pain greater than left. No known injury. EXAM: LEFT FOOT - COMPLETE 3+ VIEW COMPARISON:  10/15/2016 FINDINGS: Diffuse osteopenia of the left foot with amputation of the fifth ray and distal phalanx of the great toe. Ankylosed appearance of the  second PIP joint. Osteoarthritic joint space narrowing and sclerosis at the base of the second, third and fourth metatarsals. Osteoarthritic sclerosis and joint space narrowing of the tarsal bones of the midfoot. Rocker bottom configuration of the midfoot with soft tissue ulcerations and swelling along the plantar aspect of the mid foot. Forefoot soft tissue swelling is also noted. No radiographic evidence of osteomyelitis or acute fracture. IMPRESSION: 1. Soft tissue swelling with plantar midfoot soft tissue ulcerations. 2. Osteopenia of the left foot without radiographic evidence of osteomyelitis. 3. Generalized osteopenia with midfoot osteoarthritis. 4. Amputation of the fifth ray and distal phalanx of the great toe. 5. Ankylosed second PIP joint. Electronically Signed   By: Ashley Royalty M.D.   On: 12/12/2016 23:05   Dg Foot Complete Right  Result Date: 12/12/2016 CLINICAL DATA:  Right foot infection. EXAM: RIGHT FOOT COMPLETE - 3+ VIEW COMPARISON:  10/15/2016 and 09/29/2016 FINDINGS: Generalized osteopenia with amputation at the base of the first through fifth metatarsals. Soft tissue ulceration along the plantar aspect of the midfoot at the base of the metatarsals. No frank bone destruction. IMPRESSION: 1. Soft tissue ulceration along the plantar aspect of the foot at the base of the metatarsals. 2. Transmetatarsal amputation of the first through fifth rays. 3. No radiographic evidence of acute osteomyelitis. Electronically Signed   By: Ashley Royalty M.D.   On: 12/12/2016 23:08    Procedures Procedures (including critical care time)  Medications Ordered in ED Medications  acetaminophen (TYLENOL) tablet 650 mg (650 mg Oral Given 12/12/16 1948)  doxycycline (VIBRA-TABS) tablet 100 mg (100 mg Oral Given 12/12/16 2332)  cefTRIAXone (ROCEPHIN) injection 250 mg (250 mg Intramuscular Given 12/13/16 0002)  azithromycin (ZITHROMAX) tablet 1,000 mg (1,000 mg Oral Given 12/13/16 0002)     Initial Impression /  Assessment and Plan / ED Course  I have reviewed the triage vital signs and the nursing notes.  Pertinent labs & imaging results that were available during my care of the patient were reviewed by me and considered in my medical decision making (see chart for details).    [name] presents for evaluation of leg and abdominal pain. Her leg pain appears to be consistent withcellulitis, most likely from her bilateral diabetic foot wounds, ver the wounds themselves appear to be not infected.  X-rays were obtained of her bilateral  Feet which did not show any obvious osteomyelitis. Due to the bilateral aspect, I have a low suspicion for DVT. While patient has a history of DVT she has only had blood clots while she has had a PICC line in place, and has not had a PICC line in recently.  She will be treated with doxycycline for her cellulitis. Based on her history MRSA was considered, and based on her  kidney function shows to treat with doxycycline over Bactrim area.  Initially she was tachycardic and febrile, however repeat vitals showed that that resolved without intervention. Blood cultures were drawn. Lactic acid was not elevated.  Regarding her abdominal pain, it is suprapubic/pelvic in nature.  Pregnancy test was negative. Urine was obtained which showed glucose, small blood, over 300 protein, negative for nitrites, leuks. Microscopic showed 6-30 squamous epithelial cells along with few bacteria. I feel like this most likely represents contamination, however urine culture was sent.  Pelvic exam was performed without obvious tenderness. Due to timeline and history low suspicion for ovarian torsion.  There was no fullness to either adnexa, low suspicion for TOA.  Wet prep positive for white blood cells, and clue cells.  Gonorrhea chlamydia swabs were obtained, patient is aware that these may take multiple days to result.  Due to patient's history, we will treat for gonorrhea chlamydia. We will also  treat her for BV with Flagyl. She has been advised not to drink alcohol while on this medication.  Upon reevaluation patient's abdomen remains soft,no obvious rebound tenderness, or rigidity.     At this time there does not appear to be any evidence of an acute emergency medical condition and the patient appears stable for discharge with appropriate outpatient follow up.Diagnosis was discussed with patient who verbalizes understanding and is agreeable to discharge. Pt case discussed with Dr. Regenia Skeeter who agrees with my plan.     Final Clinical Impressions(s) / ED Diagnoses   Final diagnoses:  BV (bacterial vaginosis)  Pelvic pain in female  Fever, unspecified fever cause  Lower abdominal pain  Cellulitis of right lower extremity    New Prescriptions Discharge Medication List as of 12/12/2016 11:59 PM    START taking these medications   Details  doxycycline (VIBRAMYCIN) 100 MG capsule Take 1 capsule (100 mg total) by mouth 2 (two) times daily., Starting Wed 12/12/2016, Until Wed 12/19/2016, Print    metroNIDAZOLE (FLAGYL) 500 MG tablet Take 1 tablet (500 mg total) by mouth 2 (two) times daily., Starting Wed 12/12/2016, Print         Lorin Glass, PA-C 12/14/16 Dierdre Highman    Sherwood Gambler, MD 12/14/16 [phone]

## 2016-12-14 LAB — GC/CHLAMYDIA PROBE AMP (~~LOC~~) NOT AT ARMC
CHLAMYDIA, DNA PROBE: NEGATIVE
NEISSERIA GONORRHEA: NEGATIVE

## 2016-12-15 ENCOUNTER — Other Ambulatory Visit: Payer: Self-pay | Admitting: Endocrinology

## 2016-12-15 LAB — URINE CULTURE

## 2016-12-16 ENCOUNTER — Telehealth: Payer: Self-pay | Admitting: Emergency Medicine

## 2016-12-16 NOTE — Telephone Encounter (Signed)
Post ED Visit - Positive Culture Follow-up  Culture report reviewed by antimicrobial stewardship pharmacist:  []  Elenor Quinones, Pharm.D. []  Heide Guile, Pharm.D., BCPS AQ-ID []  Parks Neptune, Pharm.D., BCPS []  Alycia Rossetti, Pharm.D., BCPS []  [place], Pharm.D., BCPS, AAHIVP []  Legrand Como, Pharm.D., BCPS, AAHIVP []  Salome Arnt, PharmD, BCPS []  Dimitri Ped, PharmD, BCPS []  Vincenza Hews, PharmD, BCPS Jimmy Footman PharmD  Positive urine culture Treated with doxycycline and metronidazole, organism sensitive to the same and no further patient follow-up is required at this time.  Hazle Nordmann 12/16/2016, 12:12 PM

## 2016-12-17 ENCOUNTER — Telehealth: Payer: Self-pay | Admitting: Podiatry

## 2016-12-17 LAB — CULTURE, BLOOD (SINGLE)
CULTURE: NO GROWTH
Special Requests: ADEQUATE

## 2016-12-17 NOTE — Telephone Encounter (Addendum)
Left message informing pt that she would benefit from coming in on 12/18/2016 rather than waiting until 12/19/2016 to see [name], and I would have schedulers contact to schedule, continue the care recommended by the ED.12/20/2016-Faxed orders from 12/18/2016 for betadine to ulcers bilateral, then wet to dry dressings daily to Interim.

## 2016-12-17 NOTE — Telephone Encounter (Signed)
I have a nickel sized wound on my foot that I went to the ER about. Now, I have a blister that runs all the way across my foot. I'm not exactly sure what I'm supposed to do. I was supposed to come in on 19 September but I rescheduled it because I was supposed to go out of town which I'm not doing now. Can someone please call me at [phone].

## 2016-12-18 ENCOUNTER — Encounter: Payer: Self-pay | Admitting: Podiatry

## 2016-12-18 ENCOUNTER — Ambulatory Visit (INDEPENDENT_AMBULATORY_CARE_PROVIDER_SITE_OTHER): Payer: Medicare Other | Admitting: Podiatry

## 2016-12-18 ENCOUNTER — Other Ambulatory Visit: Payer: Self-pay | Admitting: Podiatry

## 2016-12-18 VITALS — BP 71/53 | HR 89 | Temp 97.6°F | Resp 18

## 2016-12-18 DIAGNOSIS — L97514 Non-pressure chronic ulcer of other part of right foot with necrosis of bone: Secondary | ICD-10-CM

## 2016-12-18 DIAGNOSIS — I70235 Atherosclerosis of native arteries of right leg with ulceration of other part of foot: Secondary | ICD-10-CM | POA: Diagnosis not present

## 2016-12-18 DIAGNOSIS — L97522 Non-pressure chronic ulcer of other part of left foot with fat layer exposed: Secondary | ICD-10-CM | POA: Diagnosis not present

## 2016-12-18 DIAGNOSIS — M86071 Acute hematogenous osteomyelitis, right ankle and foot: Secondary | ICD-10-CM

## 2016-12-18 MED ORDER — DOXYCYCLINE HYCLATE 100 MG PO TABS: 100.0000 mg | ORAL_TABLET | Freq: Two times a day (BID) | ORAL | 0 refills | Status: DC

## 2016-12-19 ENCOUNTER — Ambulatory Visit: Payer: Medicare Other | Admitting: Podiatry

## 2016-12-19 LAB — CBC WITH DIFFERENTIAL/PLATELET
BASOS ABS: 0 10*3/uL (ref 0.0–0.2)
BASOS: 0 %
EOS (ABSOLUTE): 0.2 10*3/uL (ref 0.0–0.4)
EOS: 2 %
HEMATOCRIT: 33.1 % — AB (ref 34.0–46.6)
HEMOGLOBIN: 10.2 g/dL — AB (ref 11.1–15.9)
IMMATURE GRANS (ABS): 0 10*3/uL (ref 0.0–0.1)
Immature Granulocytes: 1 %
LYMPHS ABS: 1.3 10*3/uL (ref 0.7–3.1)
LYMPHS: 15 %
MCH: 26.4 pg — AB (ref 26.6–33.0)
MCHC: 30.8 g/dL — AB (ref 31.5–35.7)
MCV: 86 fL (ref 79–97)
MONOCYTES: 6 %
Monocytes Absolute: 0.5 10*3/uL (ref 0.1–0.9)
NEUTROS ABS: 6.5 10*3/uL (ref 1.4–7.0)
Neutrophils: 76 %
Platelets: 627 10*3/uL — ABNORMAL HIGH (ref 150–379)
RBC: 3.86 x10E6/uL (ref 3.77–5.28)
RDW: 14.7 % (ref 12.3–15.4)
WBC: 8.4 10*3/uL (ref 3.4–10.8)

## 2016-12-19 LAB — C-REACTIVE PROTEIN: CRP: 17.3 mg/L — AB (ref 0.0–4.9)

## 2016-12-20 ENCOUNTER — Encounter: Payer: Self-pay | Admitting: Podiatry

## 2016-12-20 ENCOUNTER — Other Ambulatory Visit: Payer: Self-pay | Admitting: Endocrinology

## 2016-12-20 NOTE — Progress Notes (Signed)
Subjective: [name] presents the office today for concerns of a wound to the right foot. She states that last week and she went to the emergency room as she was not feeling well and the wound appear to be getting worse. She was started on antibiotics which she states that she's been taking since going on antibiotics she is feeling much better and the redness to her right foot is gained better. She states that she needed to come in as there was some skin around the wound that was peeling off that should have trimmed. [place] walking. She denies any drainage or pus coming from the wound on the right foot she states the wound in the left foot is doing well. She states "I don't know what happened" in regards to the wound on the right. Denies any systemic complaints such as fevers, chills, nausea, vomiting. No acute changes since last appointment, and no other complaints at this time.   Objective: AAO x3, NAD DP/PT pulses palpable bilaterally, CRT less than 3 seconds Sensation decreased with Simms Weinstein monofilament Previous transmetatarsal amputation of the right foot. There is a wound to the distal medial aspect of the foot measuring 2.5 x 2.5 x 0.4 cm. Along the distal aspect of the wound there is an area with exposed metatarsal head measuring 0.5 x 0.4 cm primary able to probe directed on the metatarsal head. Metatarsal appears to be hard in nature. There is no drainage or pus expressed that overall the wound appears to be fibro-granular. There does be recent faint erythema to the right foot however there is no areas of fluctuance or crepitus. There is no ascending cellulitis. There is no malodor. Wound on the left foot on the midfoot measuring about 1.5 x 1.5 x 0.2 cm. There is no swelling erythema, ascending synovitis. There is no questions or crepitus. There is no malodor. No open lesions or pre-ulcerative lesions.  No pain with calf  compression, swelling, warmth, erythema            Assessment: Right foot wound with apparent improving cellulitis however with likely osteomyelitis; left foot chronic ulceration  Plan: -All treatment options discussed with the patient including all alternatives, risks, complications.  -Recommended new x-rays which she declined she had these in the emergency room last week. -Sharply debrided wounds bilaterally today without any complications. Unfortunately there was exposed bone to the distal aspect of the wound on the right foot. We'll continue Betadine wet-to-dry dressing changes for now.  -I ordered blood work including CBC, ESR, CRP. She was hasn't on getting this done but ESR and CRP were not done in the emergency room at [facility] to get this. -Continue with surgical boot and offloading bilaterally. -Continue doxycycline- this was sent to her pharmacy -Discussed that there is any worsening symptoms she is to report back to the emergency room. Discussed likely further surgical intervention on the right foot. I will have her follow-up with Dr. Evans on Monday for further evaluation. However is any worsening before then to go to the emergency room. -Patient encouraged to call the office with any questions, concerns, change in symptoms.   Matthew Wagoner, DPM  

## 2016-12-21 ENCOUNTER — Telehealth: Payer: Self-pay | Admitting: Podiatry

## 2016-12-21 NOTE — Telephone Encounter (Signed)
Hi this is [place]. Somebody just called and my phone has been acting up wouldn't let me answer. I didn't know what you guys called about. Please call me back at [phone]. Thank you.

## 2016-12-24 ENCOUNTER — Ambulatory Visit (INDEPENDENT_AMBULATORY_CARE_PROVIDER_SITE_OTHER): Payer: Medicare Other | Admitting: Podiatry

## 2016-12-24 ENCOUNTER — Other Ambulatory Visit: Payer: Self-pay | Admitting: Endocrinology

## 2016-12-24 ENCOUNTER — Encounter: Payer: Self-pay | Admitting: Podiatry

## 2016-12-24 ENCOUNTER — Ambulatory Visit (INDEPENDENT_AMBULATORY_CARE_PROVIDER_SITE_OTHER): Payer: Medicare Other

## 2016-12-24 ENCOUNTER — Telehealth: Payer: Self-pay | Admitting: Endocrinology

## 2016-12-24 DIAGNOSIS — L97512 Non-pressure chronic ulcer of other part of right foot with fat layer exposed: Secondary | ICD-10-CM

## 2016-12-24 DIAGNOSIS — L97522 Non-pressure chronic ulcer of other part of left foot with fat layer exposed: Secondary | ICD-10-CM | POA: Diagnosis not present

## 2016-12-24 DIAGNOSIS — I70245 Atherosclerosis of native arteries of left leg with ulceration of other part of foot: Secondary | ICD-10-CM

## 2016-12-24 DIAGNOSIS — E0842 Diabetes mellitus due to underlying condition with diabetic polyneuropathy: Secondary | ICD-10-CM

## 2016-12-24 DIAGNOSIS — I70235 Atherosclerosis of native arteries of right leg with ulceration of other part of foot: Secondary | ICD-10-CM

## 2016-12-24 MED ORDER — GLUCOSE BLOOD VI STRP: ORAL_STRIP | 3 refills | Status: DC

## 2016-12-24 NOTE — Progress Notes (Signed)
   Subjective:  Patient with a history of uncontrolled diabetes mellitus presents today for follow-up treatment and evaluation regarding bilateral foot ulcerations. Patient has noticed significant deterioration of the ulceration sites. She did state that she didn't excessive amount of walking at the [employer] which may have caused the ulcerations to worsen. She recently presented to the emergency department for acute cellulitis and she was treated with Flagyl and doxycycline. She followed up with my associate Dr. Jacqualyn Posey and follows up today for further treatment and evaluation   Objective/Physical Exam General: The patient is alert and oriented x3 in no acute distress.  Dermatology:  Wound #1 noted to the lateral aspect of the left midfoot measuring approximately [phone] cm (LxWxD).  To the noted ulceration(s), there is no eschar. There is a minimal amount of slough, fibrin, and necrotic tissue noted. Granulation tissue and wound base is red. There is Minimal serosanguineous drainage noted. There is no exposed bone muscle-tendon ligament or joint. There is no malodor. Periwound integrity is  Callus.Skin is warm, dry and supple bilateral lower extremities.  Wound #2 is noted to the right transmetatarsal amputation stump measuring approximately  2.32.30.2cm (LxWxD).  To the noted ulceration wound #2 there is exposed bone. There is a moderate amount of serosanguineous drainage noted. Negative for malodor. No proximal streaking. Periwound integrity is intact. Wound base is granular and red.  Vascular:  No edema or erythema noted. Capillary refill within normal limits.There is no significant changes in temperature compared to the contralateral limb of the right lower extremity   Neurological: Epicritic and protective threshold absent bilaterally.   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 continue daily dressing changes at home  #2 wound was cleansed with normal saline #3 medically necessary excisional debridement includinmuscle and deep fasciales performed using a tissue nipper. Excisional debridement of all necrotic nonviable tissue down to healthy bleeding viable tissue was performed with post-debridement measurements same as pre-. #4 dry sterile dressings were applied. #5  due to the given history of acute cellulitis deep wound cultures were taken today and sent to pathology for culture and sensitivity #6 due to bone exposure were going to order an MRI of the right transmetatarsal amputation stump to determine bone marrow edema and possible osteomyelitis #7 return to clinic in 3 weeks to review MRI results. Patient understands that she may require additional surgery more proximal amputation.  Edrick Kins, DPM [facility]  Dr. Edrick Kins, [address]                                        [place], [place] [address]                Office [phone]  Fax [phone]

## 2016-12-24 NOTE — Telephone Encounter (Signed)
I have resent this prescription to the pharmacy with the correct Dx Code.

## 2016-12-24 NOTE — Telephone Encounter (Signed)
Called patient and left a voice message to let her know that I have resent the prescription for her Contour Next test strips with the correct diagnosis code so she should be able to get them now.

## 2016-12-24 NOTE — Telephone Encounter (Signed)
Patient called in reference to CONTOUR NEXT TEST test strip. Pharmacy stated the 10 digit code wasn't on there for them to [employer]. Please call pharmacy and advise.

## 2016-12-25 ENCOUNTER — Encounter: Payer: Self-pay | Admitting: Endocrinology

## 2016-12-25 ENCOUNTER — Other Ambulatory Visit: Payer: Self-pay | Admitting: Family Medicine

## 2016-12-25 ENCOUNTER — Other Ambulatory Visit: Payer: Self-pay

## 2016-12-25 DIAGNOSIS — M86171 Other acute osteomyelitis, right ankle and foot: Secondary | ICD-10-CM

## 2016-12-25 DIAGNOSIS — L97514 Non-pressure chronic ulcer of other part of right foot with necrosis of bone: Secondary | ICD-10-CM

## 2016-12-25 DIAGNOSIS — Z1239 Encounter for other screening for malignant neoplasm of breast: Secondary | ICD-10-CM

## 2016-12-25 NOTE — Telephone Encounter (Signed)
Patient calling to notify that [name] is the one who advised her that she would assist you with getting a new meter that is supposed to check her bs constantly? Does not require finger prick.  Please call back to discuss.  Ty,  -LL

## 2016-12-28 LAB — WOUND CULTURE
MICRO NUMBER: 81054278
SPECIMEN QUALITY: ADEQUATE

## 2017-01-01 ENCOUNTER — Ambulatory Visit
Admission: RE | Admit: 2017-01-01 | Discharge: 2017-01-01 | Disposition: A | Discharge status: Home / Self Care | Payer: Medicare Other | Source: Ambulatory Visit | Attending: Podiatry | Admitting: Podiatry

## 2017-01-01 DIAGNOSIS — M86171 Other acute osteomyelitis, right ankle and foot: Secondary | ICD-10-CM

## 2017-01-01 DIAGNOSIS — L97514 Non-pressure chronic ulcer of other part of right foot with necrosis of bone: Secondary | ICD-10-CM

## 2017-01-02 ENCOUNTER — Ambulatory Visit
Admission: RE | Admit: 2017-01-02 | Discharge: 2017-01-02 | Disposition: A | Discharge status: Home / Self Care | Payer: Medicare Other | Source: Ambulatory Visit | Attending: Family Medicine | Admitting: Family Medicine

## 2017-01-02 ENCOUNTER — Other Ambulatory Visit: Payer: Self-pay

## 2017-01-02 DIAGNOSIS — Z1239 Encounter for other screening for malignant neoplasm of breast: Secondary | ICD-10-CM

## 2017-01-02 MED ORDER — PRAVASTATIN SODIUM 40 MG PO TABS: 40.0000 mg | ORAL_TABLET | Freq: Every day | ORAL | 1 refills | Status: DC

## 2017-01-07 ENCOUNTER — Telehealth: Payer: Self-pay | Admitting: Podiatry

## 2017-01-07 NOTE — Telephone Encounter (Signed)
I was calling to see if [name] had looked at the MRI I had done and what i'm supposed to do by the results of the MRI. If you would, please call me back at [phone]. Thank you.

## 2017-01-08 NOTE — Telephone Encounter (Signed)
You can read MRI impression to patient, we will discuss it in detail on next scheduled appt.  [name]

## 2017-01-08 NOTE — Telephone Encounter (Signed)
Okay that's fine

## 2017-01-08 NOTE — Telephone Encounter (Signed)
I read pt MRI Impression as directed by [name] and pt requested earlier appt to discuss possible surgery and or antibiotic therapy.

## 2017-01-09 ENCOUNTER — Ambulatory Visit (INDEPENDENT_AMBULATORY_CARE_PROVIDER_SITE_OTHER): Payer: Medicare Other | Admitting: Podiatry

## 2017-01-09 DIAGNOSIS — L97512 Non-pressure chronic ulcer of other part of right foot with fat layer exposed: Secondary | ICD-10-CM

## 2017-01-09 DIAGNOSIS — I70245 Atherosclerosis of native arteries of left leg with ulceration of other part of foot: Secondary | ICD-10-CM

## 2017-01-09 DIAGNOSIS — I70235 Atherosclerosis of native arteries of right leg with ulceration of other part of foot: Secondary | ICD-10-CM

## 2017-01-09 DIAGNOSIS — L97522 Non-pressure chronic ulcer of other part of left foot with fat layer exposed: Secondary | ICD-10-CM | POA: Diagnosis not present

## 2017-01-09 DIAGNOSIS — E0842 Diabetes mellitus due to underlying condition with diabetic polyneuropathy: Secondary | ICD-10-CM

## 2017-01-09 NOTE — Progress Notes (Signed)
   Subjective:  Patient with a history of uncontrolled diabetes mellitus presents today for follow-up treatment and evaluation regarding bilateral foot ulcerations. Patient believes that the right foot ulceration has improved somewhat and the ulcers are currently stable. Last visit on 12/24/2016 MRI was ordered of the right foot to rule out any sign or evidence of osteomyelitis. She presents today for follow-up evaluation and to review the MRI results  Objective/Physical Exam General: The patient is alert and oriented x3 in no acute distress.  Dermatology:  Wound #1 noted to the lateral aspect of the left midfoot measuring approximately 1.3 1.2 0.2 cm (LxWxD).  To the noted ulceration(s), there is no eschar. There is a minimal amount of slough, fibrin, and necrotic tissue noted. Granulation tissue and wound base is red. There is Minimal serosanguineous drainage noted. There is no exposed bone muscle-tendon ligament or joint. There is no malodor. Periwound integrity is  Callus.Skin is warm, dry and supple bilateral lower extremities.  Wound #2 is noted to the right transmetatarsal amputation stump measuring approximately  2.02.0 0.2cm (LxWxD). There is some undermining noted to the distal aspect of the ulceration site which does extend to the level of possible bone. Depth of the undermining is partially 0.8 cm To the noted ulceration wound #2 there is exposed bone. There is a moderate amount of serosanguineous drainage noted. Negative for malodor. No proximal streaking. Periwound integrity is intact. Wound base is granular and red.  Vascular:  No edema or erythema noted. Capillary refill within normal limits.There is no significant changes in temperature compared to the contralateral limb of the right lower extremity   Neurological: Epicritic and protective threshold absent bilaterally.   Musculoskeletal Exam: History of right transmetatarsal amputation and left partial fifth ray  amputation  Assessment: #1 bilateral foot ulcerations secondary to diabetes mellitus #2 diabetes mellitus w/ peripheral neuropathy #3 history ofpartial foot amputations bilateral #4 osteomyelitis right foot  Plan of Care:  #1 Patient was evaluated. continue daily dressing changes at home  #2 wound was cleansed with normal saline #3 medically necessary excisional debridement includinmuscle and deep fasciales performed using a tissue nipper. Excisional debridement of all necrotic nonviable tissue down to healthy bleeding viable tissue was performed with post-debridement measurements same as pre-. #4 dry sterile dressings were applied. #5  today we discussed the MRI results and I explained to patient that she currently does have osteomyelitis to the distal aspect of the first metatarsal. The patient would like to pursue conservative treatment. We discussed conservative versus surgical management and the patient would rather not have any additional surgery. I expanded the patient will likely need infectious disease consultation and referral to the wound care center for possible hyperbaric oxygen and additional wound care. #6 referral placed for infectious disease consult and referral to the wound care center #7 return to clinic in 3 weeks. Continue daily dressing changes at home  Edrick Kins, DPM [facility]  Dr. Edrick Kins, [address]                                        [place], [place] [address]                Office [phone]  Fax [phone]

## 2017-01-10 ENCOUNTER — Telehealth: Payer: Self-pay | Admitting: Podiatry

## 2017-01-10 ENCOUNTER — Telehealth: Payer: Self-pay | Admitting: *Deleted

## 2017-01-10 DIAGNOSIS — L97522 Non-pressure chronic ulcer of other part of left foot with fat layer exposed: Secondary | ICD-10-CM

## 2017-01-10 DIAGNOSIS — L97512 Non-pressure chronic ulcer of other part of right foot with fat layer exposed: Secondary | ICD-10-CM

## 2017-01-10 NOTE — Telephone Encounter (Addendum)
-----   Message from Edrick Kins, DPM sent at 01/09/2017  5:51 PM EDT ----- Regarding: Referral to wound care center and infectious disease Please place referral for infectious disease and wound care center, Dr. Con Memos or Dellia Nims  Dx: Osteomyelitis first metatarsal right foot as per MRI  Thanks, [name]. 01/10/2017-Faxed required forms, clinicals and demographics to [facility].

## 2017-01-10 NOTE — Telephone Encounter (Signed)
Left voicemail for pt to call to schedule an appt to pick up braces.Marland Kitchen

## 2017-01-11 ENCOUNTER — Other Ambulatory Visit (INDEPENDENT_AMBULATORY_CARE_PROVIDER_SITE_OTHER): Payer: Medicare Other

## 2017-01-11 DIAGNOSIS — E1065 Type 1 diabetes mellitus with hyperglycemia: Secondary | ICD-10-CM

## 2017-01-11 LAB — COMPREHENSIVE METABOLIC PANEL
ALT: 50 U/L — ABNORMAL HIGH (ref 0–35)
AST: 34 U/L (ref 0–37)
Albumin: 3.3 g/dL — ABNORMAL LOW (ref 3.5–5.2)
Alkaline Phosphatase: 223 U/L — ABNORMAL HIGH (ref 39–117)
BUN: 59 mg/dL — ABNORMAL HIGH (ref 6–23)
CALCIUM: 6.8 mg/dL — AB (ref 8.4–10.5)
CHLORIDE: 107 meq/L (ref 96–112)
CO2: 20 meq/L (ref 19–32)
Creatinine, Ser: 2.22 mg/dL — ABNORMAL HIGH (ref 0.40–1.20)
GFR: 25.37 mL/min — AB (ref >60.00)
GLUCOSE: 109 mg/dL — AB (ref 70–99)
Potassium: 4.6 mEq/L (ref 3.5–5.1)
Sodium: 136 mEq/L (ref 135–145)
Total Bilirubin: 0.2 mg/dL (ref 0.2–1.2)
Total Protein: 6.9 g/dL (ref 6.0–8.3)

## 2017-01-11 LAB — LIPID PANEL
CHOL/HDL RATIO: 6
Cholesterol: 164 mg/dL (ref 0–200)
HDL: 25.5 mg/dL — AB (ref >39.00)
NONHDL: 138.59
TRIGLYCERIDES: 282 mg/dL — AB (ref 0.0–149.0)
VLDL: 56.4 mg/dL — AB (ref 0.0–40.0)

## 2017-01-11 LAB — HEMOGLOBIN A1C: HEMOGLOBIN A1C: 10.1 % — AB (ref 4.6–6.5)

## 2017-01-11 LAB — LDL CHOLESTEROL, DIRECT: Direct LDL: 93 mg/dL

## 2017-01-11 NOTE — Telephone Encounter (Signed)
Patient came in re: Blood Glucose Meter that attaches to her body. Please call patient to discuss [phone]

## 2017-01-14 ENCOUNTER — Ambulatory Visit (INDEPENDENT_AMBULATORY_CARE_PROVIDER_SITE_OTHER): Payer: Medicare Other | Admitting: Endocrinology

## 2017-01-14 ENCOUNTER — Encounter: Payer: Self-pay | Admitting: Endocrinology

## 2017-01-14 ENCOUNTER — Ambulatory Visit: Payer: Medicare Other | Admitting: Podiatry

## 2017-01-14 ENCOUNTER — Other Ambulatory Visit: Payer: Self-pay

## 2017-01-14 VITALS — BP 146/88 | HR 92 | Ht 67.0 in | Wt 132.0 lb

## 2017-01-14 DIAGNOSIS — N184 Chronic kidney disease, stage 4 (severe): Secondary | ICD-10-CM

## 2017-01-14 DIAGNOSIS — Z23 Encounter for immunization: Secondary | ICD-10-CM | POA: Diagnosis not present

## 2017-01-14 DIAGNOSIS — I70235 Atherosclerosis of native arteries of right leg with ulceration of other part of foot: Secondary | ICD-10-CM

## 2017-01-14 DIAGNOSIS — E1022 Type 1 diabetes mellitus with diabetic chronic kidney disease: Secondary | ICD-10-CM | POA: Diagnosis not present

## 2017-01-14 DIAGNOSIS — E1065 Type 1 diabetes mellitus with hyperglycemia: Secondary | ICD-10-CM

## 2017-01-14 MED ORDER — FREESTYLE LIBRE SENSOR SYSTEM MISC: 4 refills | Status: DC

## 2017-01-14 MED ORDER — FREESTYLE LIBRE READER DEVI: 1.0000 | 1 refills | Status: DC

## 2017-01-14 NOTE — Patient Instructions (Addendum)
Check sugar at each meal and 2 hrs later  Take Calitriol daily as also Pravastatin  Use infusion set for 3 days  tAKE AMLODIPINE DAILY

## 2017-01-14 NOTE — Progress Notes (Signed)
Patient ID: [name], female   DOB: 08/14/1970, 46 y.o.   MRN: [id number]   Reason for Appointment: follow-up of diabetes  History of Present Illness   Diagnosis: Type 1 DIABETES MELITUS  Previous history: She has had long-standing poorly controlled diabetes and typically has poor compliance with self care measures despite periodic diabetes education and periodic followup  INSULIN PUMP regimen:   BASAL rate 0.4 at midnight, 0.5 at 8 AM, 0.55 at 6 PM  0.45 between 12 noon--4 PM and then 0.6   Carb coverage 1: 15 g and correction 1: 50 with target 120, active insulin 6 hours         Recent history:   Her last A1c was 11.6 and is now 10.1  She is checking her blood sugars up to 4 times a day, more recently and limited by painful finger sticks  Current blood sugar patterns, difficulties with management and problems identified:  Blood sugars are fairly erratic with no consistent pattern  However HIGHEST blood sugars on a consistent basis are usually before her bedtime  This may be related to her not doing any BOLUSES frequently at her evening meal  Also has variable eating and sleeping times  HYPOGLYCEMIA has occurred only 2 or 3 times and she is not always symptomatic  Low blood sugar episodes: her blood sugar was 49 the day after she had persistent high readings and took extra insulin for correction; another episode was around 8-9 PM without any apparent bolus preceding that and she thinks this is from her not eating during the day  Most of her sugars at suppertime are probably high before eating however  Blood sugars when she occasionally checks them at waking up time in the early afternoon are either normal or variably high  She was sometimes suspend her pump for up to several hours, occasionally when sugar is low normal or low but is not doing this as much   She says she do not take insulin bolus more than 70 units at a time because she thinks it makes  it come down too quickly  Mean values apply above for all meters except median for One Touch  PRE-MEAL Fasting Lunch Dinner Bedtime Overall  Glucose range:  49-340    43-397   166-400+    Mean/median:     337      Meals: eating at variable times and also having snacks periodically.  Her daily carbohydrate intake is quite variable Physical activity: exercise: none         Dietician visit: Most recent:?          Complications: are: Neuropathy, nephropathy, diabetic foot ulcer      Wt Readings from Last 3 Encounters:  01/14/17 132 lb (59.9 kg)  12/12/16 125 lb (56.7 kg)  11/12/16 131 lb 12.8 oz (59.8 kg)   Lab Results  Component Value Date   HGBA1C 10.1 (H) 01/11/2017   HGBA1C 11.6 (H) 10/05/2016   HGBA1C 11.0 (H) 05/02/2016   Lab Results  Component Value Date   MICROALBUR 139.6 (H) 10/05/2016   LDLCALC 78 05/02/2016   CREATININE 2.22 (H) 01/11/2017    Other problems addressed:: See review of systems   Allergies as of 01/14/2017      Reactions   Cleocin [clindamycin Hcl] Diarrhea   Diarrhea    Lisinopril Other (See Comments)   Elevated potassium per pt report   Amoxicillin Diarrhea, Nausea Only   Bactrim [sulfamethoxazole-trimethoprim] Diarrhea, Nausea Only  Ciprofloxacin Itching      Medication List       Accurate as of 01/14/17  2:52 PM. Always use your most recent med list.          albuterol 108 (90 Base) MCG/ACT inhaler Commonly known as:  PROVENTIL HFA;VENTOLIN HFA Inhale 2 puffs into the lungs every 6 (six) hours as needed for wheezing or shortness of breath. Reported on 04/15/2015   amLODipine 2.5 MG tablet Commonly known as:  NORVASC Take 1 tablet (2.5 mg total) by mouth daily.   aspirin 325 MG tablet Take 325 mg by mouth daily.   calcitRIOL 0.5 MCG capsule Commonly known as:  ROCALTROL Take 1 capsule by mouth daily.   CALCIUM GUMMIES 250-100-500 MG-MG-UNIT Chew Generic drug:  Calcium-Phosphorus-Vitamin D Chew 2 each by mouth daily.     CINNAMON PO Take 1 tablet by mouth daily.   doxycycline 100 MG tablet Commonly known as:  VIBRA-TABS Take 1 tablet (100 mg total) by mouth 2 (two) times daily.   folic acid 1 MG tablet Commonly known as:  FOLVITE Take 1 mg by mouth daily.   FREESTYLE LIBRE READER Devi 1 Device by Does not apply route as directed.   FREESTYLE LIBRE SENSOR SYSTEM Misc Use 1 sensor every 10 days   furosemide 40 MG tablet Commonly known as:  LASIX Take 40 mg by mouth daily as needed for fluid.   gabapentin 300 MG capsule Commonly known as:  NEURONTIN Take 1 capsule (300 mg total) by mouth 2 (two) times daily.   glucose blood test strip Commonly known as:  CONTOUR NEXT TEST USE AS INSTRUCTED TO CHECK BLOOD SUGAR 4 TIMES DAILY Dx Code E10.65   HUMALOG 100 UNIT/ML injection Generic drug:  insulin lispro CALL MD IF <70, IF 151-200 =2UNITS,201-250 =4 UNITS,251-300 =6 UNITS, 301-350 =8 UNITS,351-400 =10 U   insulin pump Soln Inject into the skin. 15 carbs = 1 unit, every 50 sugars = 1 unit   metoCLOPramide 5 MG tablet Commonly known as:  REGLAN Take 1 tablet (5 mg total) by mouth 3 (three) times daily before meals.   metroNIDAZOLE 500 MG tablet Commonly known as:  FLAGYL Take 1 tablet (500 mg total) by mouth 2 (two) times daily.   norethindrone 5 MG tablet Commonly known as:  AYGESTIN Take 1 tablet by mouth See admin instructions. Take 1 tablet daily for 14 days and off 14 days.Philbert Riser BONE Caps Take 1 each by mouth daily.   ondansetron 4 MG disintegrating tablet Commonly known as:  ZOFRAN-ODT DISSOLVE 1 TABLET ON TONGUE EVERY 8 HOURS AS NEEDED FOR NAUSEA   pravastatin 40 MG tablet Commonly known as:  PRAVACHOL Take 1 tablet (40 mg total) by mouth daily.   Probiotic Caps Take 1 capsule by mouth daily.   pyridoxine 100 MG tablet Commonly known as:  B-6 Take 100 mg by mouth daily. Reported on 04/15/2015   traMADol 50 MG tablet Commonly known as:  ULTRAM Take 1 tablet  (50 mg total) by mouth every 6 (six) hours as needed.   VISION PLUS Caps Take 1 each by mouth daily.   VITAMIN B12 PO Take 1 each by mouth daily.   vitamin C 500 MG tablet Commonly known as:  ASCORBIC ACID Take 500 mg by mouth daily.       Allergies:  Allergies  Allergen Reactions  . Cleocin [Clindamycin Hcl] Diarrhea    Diarrhea   . Lisinopril Other (See Comments)    Elevated  potassium per pt report  . Amoxicillin Diarrhea and Nausea Only  . Bactrim [Sulfamethoxazole-Trimethoprim] Diarrhea and Nausea Only  . Ciprofloxacin Itching    Past Medical History:  Diagnosis Date  . Anemia   . C. difficile diarrhea 09/26/2014  . Cataracts, both eyes   . Cellulitis of right foot 06/02/2014  . CKD (chronic kidney disease) stage 3, GFR 30-59 ml/min (HCC)    sees Dr Justin Mend- Stage IV  . Diabetic ulcer of right foot ([place]) 06/02/2014  . DVT (deep venous thrombosis) ([place]) 10/2014   one in each one in leg- PICC line   . H/O seasonal allergies   . Heart murmur    told once when she was pregnant- no furter mention- was in notes 11/2015- had Echo 8 /4/17  . History of blood transfusion   . Hypercholesteremia   . Hypertension   . Left foot infection   . Neuropathy    feet  . Neuropathy in diabetes ([place])   . Peripheral vascular disease ([place])   . Staphylococcus aureus bacteremia 10/2014  . Type 1 diabetes ([place])    onset age 16    Past Surgical History:  Procedure Laterality Date  . AMPUTATION TOE Left 07/24/2015   5th toe and Hallux amputation  . CESAREAN SECTION    . EXOSTECTECTOMY TOE Left 04/11/2016   Procedure: EXOSTECTECTOMY CUBOID AND 5TH METATARSAL AND PLACEMENT OF ANTIBIOTIC BEADS;  Surgeon: Edrick Kins, DPM;  Location: [place];  Service: Podiatry;  Laterality: Left;  . EYE SURGERY Bilateral    [place]  . FEMORAL-POPLITEAL BYPASS GRAFT Left 05/03/2015  . FOOT AMPUTATION THROUGH METATARSAL Right 2016  . hemrrhoidectomy    . I&D EXTREMITY Right 06/10/2014   Procedure: IRRIGATION  AND DEBRIDEMENT Right Foot;  Surgeon: Newt Minion, MD;  Location: [facility];  Service: Orthopedics;  Laterality: Right;  . INCISION AND DRAINAGE Left 04/11/2016   Procedure: INCISION AND DRAINAGE A DEEP COMPLICATED WOUND OF LEFT FOOT;  Surgeon: Edrick Kins, DPM;  Location: [facility];  Service: Podiatry;  Laterality: Left;  . PERIPHERALLY INSERTED CENTRAL CATHETER INSERTION    . SKIN GRAFT Right 06/15/2014  . SKIN GRAFT Left 06/2015   foot   . SKIN SPLIT GRAFT Right 06/15/2014   Procedure: SPLIT THICKNESS SKIN GRAFT RIGHT FOOT;  Surgeon: Newt Minion, MD;  Location: [facility];  Service: Orthopedics;  Laterality: Right;  . TRANSMETATARSAL AMPUTATION Right   . TRANSMETATARSAL AMPUTATION Right 09/11/2014    Family History  Problem Relation Age of Onset  . Hyperlipidemia Mother   . Dementia Mother     Social History:  reports that she quit smoking about 8 years ago. She quit after 15.00 years of use. She has never used smokeless tobacco. She reports that she drinks alcohol. She reports that she does not use drugs.  Review of Systems:   Hypertension/CKD:    Hypertension  Treated with amlodipine 2.5 mg, likely she is taking this irregularly and not clear why her blood pressure has been fluctuating, higher than usual in the office today  BP Readings from Last 3 Encounters:  01/14/17 (!) 146/88  12/18/16 (!) 71/53  12/12/16 122/74     She has seen her nephrologist She does have hypocalcemia but she does not take her calcitriol regularly Despite reminders No cramping in her muscles Calcium is low along with her albumin  Lab Results  Component Value Date   CALCIUM 6.8 (L) 01/11/2017   PHOS 3.5 08/18/2014  Lab Results  Component Value Date   CREATININE 2.22 (H) 01/11/2017   BUN 59 (H) 01/11/2017   NA 136 01/11/2017   K 4.6 01/11/2017   CL 107 01/11/2017   CO2 20 01/11/2017     Lipids: Has been  prescribed pravastatin, Again she did not take this regularly and recently ran out  but is going to get the prescription filled today Triglycerides are high LDL controlled    Lab Results  Component Value Date   CHOL 164 01/11/2017   HDL 25.50 (L) 01/11/2017   LDLCALC 78 05/02/2016   LDLDIRECT 93.0 01/11/2017   TRIG 282.0 (H) 01/11/2017   CHOLHDL 6 01/11/2017      Chronic anemia:  Lab Results  Component Value Date   WBC 8.4 12/18/2016   HGB 10.2 (L) 12/18/2016   HCT 33.1 (L) 12/18/2016   MCV 86 12/18/2016   PLT 627 (H) 12/18/2016    She has a Charcot foot And has had amputation of Left fifth toe which was infected She is now going to the podiatrist with some continued Bone infection  She was told she cannot get into the bariatric chamber because of her high sugars    Examination:   BP (!) 146/88 (Cuff Size: Normal)   Pulse 92   Ht 5\' 7"  (1.702 m)   Wt 132 lb (59.9 kg)   LMP 12/20/2016   SpO2 97%   BMI 20.67 kg/m   Body mass index is 20.67 kg/m.     ASSESSMENT/ PLAN:   Diabetes type 1 with poor control and multiple complications    Her diabetes is persistently poorly controlled mostly from noncompliance with all measures of self-care  A1c is over 10%  See history of present illness for details of her blood sugar patterns, current management and problems identified  Her blood sugars are somewhat labile and difficult to get a consistent pattern Also has unexpected low sugars at times As discussed above most of her high readings are related to an adequate boluses and probably not getting enough basal rate with consistently high readings late in the evenings Blood sugars may be lower when she wakes up She is reluctant to increase frequency of her monitoring much because of painful fingersticks and is awaiting her libre sensor which was prescribed again today  Will adjust her basal rates accordingly She will need to bolus consistently at all meals  DIABETIC nephropathy with nephrotic syndrome: Now followed by nephrologist Creatinine  clearance currently 25  Hypocalcemia: She is not taking her calcitriol regularly,  asymptomatic   PLAN:    Basal rate midnight = 0.55, 3 AM = 0.4, 4 PM = 0.55 and 9 PM = 0.65  Sensitivity 1:60 as before  She will check her blood sugars at time of the bolus for meals consistently  Change infusion set consistently every 3 days and she needs to check her infusion set if she has persistently high readings  She should benefit from another session with the nurse educator but she refuses and basic education given today by CMA also  HYPERTENSION: She needs to take her amlodipine daily  Renal insufficiency and vitamin D deficiency:  Follow-up with nephrologist She needs to take calcitriol daily  Influenza vaccine given  Counseling time on subjects discussed in assessment and plan sections is over 50% of today's 25 minute visit  Patient Instructions  Check sugar at each meal and 2 hrs later  Take Calitriol daily as also Pravastatin  Use infusion set for  3 days  tAKE AMLODIPINE DAILY    [name] 01/14/2017, 2:52 PM

## 2017-01-15 ENCOUNTER — Other Ambulatory Visit: Payer: Self-pay

## 2017-01-15 ENCOUNTER — Ambulatory Visit (INDEPENDENT_AMBULATORY_CARE_PROVIDER_SITE_OTHER): Payer: Medicare Other | Admitting: Orthotics

## 2017-01-15 DIAGNOSIS — Z899 Acquired absence of limb, unspecified: Secondary | ICD-10-CM | POA: Diagnosis not present

## 2017-01-15 DIAGNOSIS — L97509 Non-pressure chronic ulcer of other part of unspecified foot with unspecified severity: Secondary | ICD-10-CM | POA: Diagnosis not present

## 2017-01-15 DIAGNOSIS — L97512 Non-pressure chronic ulcer of other part of right foot with fat layer exposed: Secondary | ICD-10-CM | POA: Diagnosis not present

## 2017-01-15 DIAGNOSIS — M14672 Charcot's joint, left ankle and foot: Secondary | ICD-10-CM | POA: Diagnosis not present

## 2017-01-15 DIAGNOSIS — E1051 Type 1 diabetes mellitus with diabetic peripheral angiopathy without gangrene: Secondary | ICD-10-CM

## 2017-01-15 DIAGNOSIS — E0842 Diabetes mellitus due to underlying condition with diabetic polyneuropathy: Secondary | ICD-10-CM | POA: Diagnosis not present

## 2017-01-15 DIAGNOSIS — E13621 Other specified diabetes mellitus with foot ulcer: Secondary | ICD-10-CM | POA: Diagnosis not present

## 2017-01-15 DIAGNOSIS — L02612 Cutaneous abscess of left foot: Secondary | ICD-10-CM

## 2017-01-15 DIAGNOSIS — I70235 Atherosclerosis of native arteries of right leg with ulceration of other part of foot: Secondary | ICD-10-CM

## 2017-01-15 DIAGNOSIS — L97522 Non-pressure chronic ulcer of other part of left foot with fat layer exposed: Secondary | ICD-10-CM | POA: Diagnosis not present

## 2017-01-15 DIAGNOSIS — E11610 Type 2 diabetes mellitus with diabetic neuropathic arthropathy: Secondary | ICD-10-CM | POA: Diagnosis not present

## 2017-01-15 MED ORDER — AMLODIPINE BESYLATE 2.5 MG PO TABS: 2.5000 mg | ORAL_TABLET | Freq: Every day | ORAL | 2 refills | Status: DC

## 2017-01-16 ENCOUNTER — Encounter (HOSPITAL_BASED_OUTPATIENT_CLINIC_OR_DEPARTMENT_OTHER): Payer: Medicare Other

## 2017-01-16 ENCOUNTER — Ambulatory Visit (INDEPENDENT_AMBULATORY_CARE_PROVIDER_SITE_OTHER): Payer: Medicare Other | Admitting: Internal Medicine

## 2017-01-16 ENCOUNTER — Encounter: Payer: Self-pay | Admitting: Internal Medicine

## 2017-01-16 DIAGNOSIS — M86671 Other chronic osteomyelitis, right ankle and foot: Secondary | ICD-10-CM | POA: Diagnosis not present

## 2017-01-16 DIAGNOSIS — I70235 Atherosclerosis of native arteries of right leg with ulceration of other part of foot: Secondary | ICD-10-CM

## 2017-01-16 DIAGNOSIS — E1051 Type 1 diabetes mellitus with diabetic peripheral angiopathy without gangrene: Secondary | ICD-10-CM | POA: Insufficient documentation

## 2017-01-16 MED ORDER — CIPROFLOXACIN HCL 500 MG PO TABS: 500.0000 mg | ORAL_TABLET | Freq: Two times a day (BID) | ORAL | 0 refills | Status: DC

## 2017-01-16 NOTE — Progress Notes (Addendum)
[facility] for Infectious Disease  Reason for Consult: Diabetic foot ulcer with osteomyelitis of left first metatarsal Referring Physician: 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 For obvious reasons she would like to try to cure this infection without having to have further surgery. 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 Recent cultures from both feet have grown MRSA, Pseudomonas and mixed organisms. I talked to her about treatment options. She has chronic renal insufficiency and has had problems with upper arm extremities when she has had PICC lines in both her right and left arms. Her allergy list indicates that she previously had a low severity reaction to ciprofloxacin with itching. She does not recall this being very severe. I will give her a test dose of ciprofloxacin tonight. If she tolerates that then I will consider adding oral linezolid and metronidazole. This 3 drug oral regimen is highly bioavailable and will offer good coverage of MRSA, Pseudomonas and anaerobes. If she does not tolerate ciprofloxacin she will need IV antibiotic therapy. In that case I will ask interventional radiology to place a central venous line to try to reduce the risk of recurrent DVT and treat her with IV cefepime plus oral linezolid and metronidazole..  Plan: 1. Administer a test dose of ciprofloxacin tonight 2. I will call her tomorrow   Addendum: She started ciprofloxacin last night and has tolerated it well so far. I will go ahead and add oral linezolid and metronidazole to her regimen. She will follow-up with me on 01/31/2017.  Patient Active Problem List   Diagnosis Date Noted  . Chronic osteomyelitis of right foot ([place]) 01/16/2017    Priority: High  . Status post transmetatarsal amputation of right foot ([place]) 09/26/2014    Priority: High  .  Foot ulcer due to secondary DM ([place]) 05/09/2011    Priority: High  . Diabetes mellitus type 1 with peripheral artery disease ([place]) 01/16/2017  . Heart murmur 11/03/2015  . Protein-calorie malnutrition, moderate ([place]) 10/18/2014  . History of DVT (deep vein thrombosis)   . Chronic anemia 10/13/2014  . DM type 1 causing renal disease ([place]) 06/13/2013  . CKD (chronic kidney disease), stage III ([place]) 06/12/2013  . Charcot foot due to diabetes mellitus ([place]) 05/09/2011  . Type 1 diabetes mellitus with neurological manifestations, uncontrolled ([place])   . Hypertension   . Hypercholesteremia     Patient's Medications  New Prescriptions   CIPROFLOXACIN (CIPRO) 500 MG TABLET    Take 1 tablet (500 mg total) by mouth 2 (two) times daily.  Previous Medications   ALBUTEROL (PROVENTIL HFA;VENTOLIN HFA) 108 (90 BASE) MCG/ACT INHALER    Inhale 2 puffs into the lungs every 6 (six) hours as needed for wheezing or shortness of breath. Reported on 04/15/2015   AMLODIPINE (NORVASC) 2.5 MG TABLET    Take 1 tablet (2.5 mg total) by mouth daily.   ASPIRIN 325 MG TABLET    Take 325 mg by mouth daily.   CALCITRIOL (ROCALTROL) 0.5 MCG CAPSULE    Take 1 capsule by mouth daily.   CALCIUM-PHOSPHORUS-VITAMIN D (CALCIUM GUMMIES) [id number] MG-MG-UNIT CHEW    Chew 2 each by mouth daily.   CINNAMON PO    Take 1 tablet by mouth daily.   CONTINUOUS BLOOD GLUC RECEIVER (FREESTYLE LIBRE READER) DEVI    1 Device by Does not  apply route as directed.   CONTINUOUS BLOOD GLUC SENSOR (FREESTYLE LIBRE SENSOR SYSTEM) MISC    Use 1 sensor every 10 days   CYANOCOBALAMIN (VITAMIN B12 PO)    Take 1 each by mouth daily.   FOLIC ACID (FOLVITE) 1 MG TABLET    Take 1 mg by mouth daily.   FUROSEMIDE (LASIX) 40 MG TABLET    Take 40 mg by mouth daily as needed for fluid.    GABAPENTIN (NEURONTIN) 300 MG CAPSULE    Take 1 capsule (300 mg total) by mouth 2 (two) times daily.   GLUCOSE BLOOD (CONTOUR NEXT TEST) TEST STRIP    USE AS INSTRUCTED TO  CHECK BLOOD SUGAR 4 TIMES DAILY Dx Code E10.65   HUMALOG 100 UNIT/ML INJECTION    CALL MD IF <70, IF 151-200 =2UNITS,201-250 =4 UNITS,251-300 =6 UNITS, 301-350 =8 UNITS,351-400 =10 U   INSULIN HUMAN (INSULIN PUMP) SOLN    Inject into the skin. 15 carbs = 1 unit, every 50 sugars = 1 unit   MULTIPLE MINERALS-VITAMINS (NUTRA-SUPPORT BONE) CAPS    Take 1 each by mouth daily.   MULTIPLE VITAMINS-MINERALS (VISION PLUS) CAPS    Take 1 each by mouth daily.   NORETHINDRONE (AYGESTIN) 5 MG TABLET    Take 1 tablet by mouth See admin instructions. Take 1 tablet daily for 14 days and off 14 days.Marland Kitchen   ONDANSETRON (ZOFRAN-ODT) 4 MG DISINTEGRATING TABLET    DISSOLVE 1 TABLET ON TONGUE EVERY 8 HOURS AS NEEDED FOR NAUSEA   PRAVASTATIN (PRAVACHOL) 40 MG TABLET    Take 1 tablet (40 mg total) by mouth daily.   PROBIOTIC CAPS    Take 1 capsule by mouth daily.   PYRIDOXINE (B-6) 100 MG TABLET    Take 100 mg by mouth daily. Reported on 04/15/2015   TRAMADOL (ULTRAM) 50 MG TABLET    Take 1 tablet (50 mg total) by mouth every 6 (six) hours as needed.   VITAMIN C (ASCORBIC ACID) 500 MG TABLET    Take 500 mg by mouth daily.  Modified Medications   No medications on file  Discontinued Medications   DOXYCYCLINE (VIBRA-TABS) 100 MG TABLET    Take 1 tablet (100 mg total) by mouth 2 (two) times daily.   METOCLOPRAMIDE (REGLAN) 5 MG TABLET    Take 1 tablet (5 mg total) by mouth 3 (three) times daily before meals.   METRONIDAZOLE (FLAGYL) 500 MG TABLET    Take 1 tablet (500 mg total) by mouth 2 (two) times daily.    HPI: [name] is a 46 y.o. female with poorly controlled type 1 diabetes, peripheral neuropathy, Charcot arthropathy and peripheral artery disease who was had multiple diabetic foot infections and prior surgeries. We have seen her previously in 2013 and again in 2016 when she had foot infections and MRSA bacteremia. Since that time she has undergone right transmetatarsal amputation and left fifth ray  amputation. About one year ago she developed left foot abscess and osteomyelitis and underwent incision and drainage in January. Operative cultures grew MRSA. She's been on multiple rounds of antibiotics and finally healed that wound. She had a bland plantar ulcer under her right transmetatarsal stump. That has been debrided serially over the past 6 months. Cultures obtained in July showed multiple organisms. She tells me that in September after standing on her feet at the [facility] for long periods of time she developed a large blister over the end of her right transmetatarsal stump that became infected.  Wound culture obtained on 12/24/2016 grew Pseudomonas. She developed visible bone of the first metatarsal stump in the ulcer bed. An MRI of her right foot obtained on 01/01/2017 showed "soft tissue ulcer along the plantar aspect of the first metatarsal base with osteomyelitis of the first metatarsal stump. Mild marrow edema in the second metatarsal stump without cortical destruction which likely reflects reactive marrow edema secondary to adjacent inflammation." She overslept this morning and missed her visit at the wound center. She has been using medahoney and CBD oil on her plantar ulcer.  Review of Systems: Review of Systems  Constitutional: Negative for chills, diaphoresis, fever, malaise/fatigue and weight loss.  HENT: Negative for sore throat.   Respiratory: Negative for cough, sputum production and shortness of breath.   Cardiovascular: Negative for chest pain.  Gastrointestinal: Negative for abdominal pain, diarrhea, heartburn, nausea and vomiting.  Genitourinary: Negative for dysuria and frequency.  Musculoskeletal: Negative for joint pain and myalgias.       She has very little sensation in her feet.  Skin: Negative for rash.  Neurological: Positive for sensory change. Negative for dizziness, focal weakness and headaches.      Past Medical History:  Diagnosis Date  . Anemia   .  C. difficile diarrhea 09/26/2014  . Cataracts, both eyes   . Cellulitis of right foot 06/02/2014  . CKD (chronic kidney disease) stage 3, GFR 30-59 ml/min (HCC)    sees Dr Justin Mend- Stage IV  . Diabetic ulcer of right foot ([place]) 06/02/2014  . DVT (deep venous thrombosis) ([place]) 10/2014   one in each one in leg- PICC line   . H/O seasonal allergies   . Heart murmur    told once when she was pregnant- no furter mention- was in notes 11/2015- had Echo 8 /4/17  . History of blood transfusion   . Hypercholesteremia   . Hypertension   . Left foot infection   . Neuropathy    feet  . Neuropathy in diabetes ([place])   . Peripheral vascular disease ([place])   . Staphylococcus aureus bacteremia 10/2014  . Type 1 diabetes [facility])    onset age 22    Social History  Substance Use Topics  . Smoking status: Former Smoker    Years: 15.00    Quit date: 05/16/2008  . Smokeless tobacco: Never Used  . Alcohol use Yes     Comment: Occasional    Family History  Problem Relation Age of Onset  . Hyperlipidemia Mother   . Dementia Mother    Allergies  Allergen Reactions  . Cleocin [Clindamycin Hcl] Diarrhea    Diarrhea   . Lisinopril Other (See Comments)    Elevated potassium per pt report  . Amoxicillin Diarrhea and Nausea Only  . Bactrim [Sulfamethoxazole-Trimethoprim] Diarrhea and Nausea Only  . Ciprofloxacin Itching    OBJECTIVE: Vitals:   01/16/17 1442  BP: (!) 152/87  Pulse: 89  Temp: 98.9 F (37.2 C)  TempSrc: Oral  Weight: 131 lb (59.4 kg)  Height: 5\' 7"  (1.702 m)   Body mass index is 20.52 kg/m.   Physical Exam  Constitutional: She is oriented to person, place, and time.  She is in good spirits. She is very talkative.  HENT:  Mouth/Throat: No oropharyngeal exudate.  Cardiovascular: Normal rate and regular rhythm.   No murmur heard. Pulmonary/Chest: Effort normal and breath sounds normal.  Abdominal: Soft. There is no tenderness.  Musculoskeletal:  Healed incisions from previous  right transmetatarsal amputation and left fifth metatarsal ray  amputation. There is a plantar ulcer on her right foot under the first metatarsal stump measuring 2.5 x 1.5 cm. There is good red granulation tissue across the base of the ulcer. Bone is palpable. There is no drainage or odor. There is no surrounding cellulitis or fluctuance.  Neurological: She is alert and oriented to person, place, and time.  Skin: No rash noted.  Multiple small scars from previous boils. Some tattoos.  Psychiatric: Mood and affect normal.    Microbiology: No results found for this or any previous visit (from the past 240 hour(s)).  Michel Bickers, MD [facility] [phone] pager   [phone] cell 01/16/2017, 4:04 PM

## 2017-01-17 LAB — C-REACTIVE PROTEIN: CRP: 10.6 mg/L — AB (ref <8.0)

## 2017-01-17 LAB — SEDIMENTATION RATE: SED RATE: 67 mm/h — AB (ref 0–20)

## 2017-01-18 MED ORDER — LINEZOLID 600 MG PO TABS: 600.0000 mg | ORAL_TABLET | Freq: Two times a day (BID) | ORAL | 0 refills | Status: DC

## 2017-01-18 MED ORDER — METRONIDAZOLE 500 MG PO TABS: 500.0000 mg | ORAL_TABLET | Freq: Three times a day (TID) | ORAL | 1 refills | Status: DC

## 2017-01-18 NOTE — Addendum Note (Signed)
Addended by: Michel Bickers on: 01/18/2017 03:42 PM   Modules accepted: Orders

## 2017-01-23 ENCOUNTER — Telehealth: Payer: Self-pay | Admitting: *Deleted

## 2017-01-23 NOTE — Telephone Encounter (Signed)
Patient called stating the pharmacy did not receive the two antibiotic Rxs that were sent to [facility]. Flagyl  and linezolid phoned to [facility] pharmacist. Myrtis Hopping CMA

## 2017-01-28 ENCOUNTER — Encounter: Payer: Self-pay | Admitting: Podiatry

## 2017-01-28 ENCOUNTER — Ambulatory Visit (INDEPENDENT_AMBULATORY_CARE_PROVIDER_SITE_OTHER): Payer: Medicare Other | Admitting: Podiatry

## 2017-01-28 DIAGNOSIS — E0842 Diabetes mellitus due to underlying condition with diabetic polyneuropathy: Secondary | ICD-10-CM

## 2017-01-28 DIAGNOSIS — I70245 Atherosclerosis of native arteries of left leg with ulceration of other part of foot: Secondary | ICD-10-CM

## 2017-01-28 DIAGNOSIS — L97522 Non-pressure chronic ulcer of other part of left foot with fat layer exposed: Secondary | ICD-10-CM

## 2017-01-28 DIAGNOSIS — I70235 Atherosclerosis of native arteries of right leg with ulceration of other part of foot: Secondary | ICD-10-CM

## 2017-01-28 DIAGNOSIS — L97512 Non-pressure chronic ulcer of other part of right foot with fat layer exposed: Secondary | ICD-10-CM

## 2017-01-30 ENCOUNTER — Other Ambulatory Visit: Payer: Self-pay | Admitting: *Deleted

## 2017-01-30 ENCOUNTER — Telehealth: Payer: Self-pay | Admitting: *Deleted

## 2017-01-30 DIAGNOSIS — M86671 Other chronic osteomyelitis, right ankle and foot: Secondary | ICD-10-CM

## 2017-01-30 MED ORDER — CIPROFLOXACIN HCL 500 MG PO TABS: 500.0000 mg | ORAL_TABLET | Freq: Two times a day (BID) | ORAL | 0 refills | Status: DC

## 2017-01-30 NOTE — Telephone Encounter (Signed)
Patient called stating she only has one more dose of the cipro. Please advise if a refill should be sent. Also the Linezolid needs a prior authorization. Authorization submitted to Cover my meds. Myrtis Hopping

## 2017-01-30 NOTE — Telephone Encounter (Signed)
She needs to be on oral ciprofloxacin, linezolid and metronidazole together. Please send in a refill on the ciprofloxacin and let her know that we will try to sort out a prior authorization for linezolid at her visit with me tomorrow.

## 2017-01-30 NOTE — Telephone Encounter (Signed)
Left patient a voice mail stating an Rx was sent to her pharmacy.

## 2017-01-30 NOTE — Progress Notes (Signed)
Subjective:  Patient with a history of uncontrolled diabetes mellitus presents today for follow-up treatment and evaluation regarding bilateral foot ulcerations. She states she has been evaluated by infectious disease regarding the wounds. She reports applying Medihoney to the areas. She has an appointment with the wound care center on 02/28/2017. She is here for further evaluation and treatment.    Past Medical History:  Diagnosis Date  . Anemia   . C. difficile diarrhea 09/26/2014  . Cataracts, both eyes   . Cellulitis of right foot 06/02/2014  . CKD (chronic kidney disease) stage 3, GFR 30-59 ml/min (HCC)    sees Dr Justin Mend- Stage IV  . Diabetic ulcer of right foot ([place]) 06/02/2014  . DVT (deep venous thrombosis) ([place]) 10/2014   one in each one in leg- PICC line   . H/O seasonal allergies   . Heart murmur    told once when she was pregnant- no furter mention- was in notes 11/2015- had Echo 8 /4/17  . History of blood transfusion   . Hypercholesteremia   . Hypertension   . Left foot infection   . Neuropathy    feet  . Neuropathy in diabetes ([place])   . Peripheral vascular disease ([place])   . Staphylococcus aureus bacteremia 10/2014  . Type 1 diabetes ([place])    onset age 63     Objective/Physical Exam General: The patient is alert and oriented x3 in no acute distress.  Dermatology:  Wound #1 noted to the lateral aspect of the left midfoot measuring approximately 1.3 1.2 0.2 cm (LxWxD).  To the noted ulceration(s), there is no eschar. There is a minimal amount of slough, fibrin, and necrotic tissue noted. Granulation tissue and wound base is red. There is Minimal serosanguineous drainage noted. There is no exposed bone muscle-tendon ligament or joint. There is no malodor. Periwound integrity is  Callus.Skin is warm, dry and supple bilateral lower extremities.  Wound #2 is noted to the right transmetatarsal amputation stump measuring approximately  2.02.0 0.2cm (LxWxD). There is some  undermining noted to the distal aspect of the ulceration site which does extend to the level of possible bone. Depth of the undermining is partially 0.8 cm To the noted ulceration wound #2 there is exposed bone. There is a moderate amount of serosanguineous drainage noted. Negative for malodor. No proximal streaking. Periwound integrity is intact. Wound base is granular and red.  Vascular:  No edema or erythema noted. Capillary refill within normal limits.There is no significant changes in temperature compared to the contralateral limb of the right lower extremity   Neurological: Epicritic and protective threshold absent bilaterally.   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  #2 medically necessary excisional debridement including subcutaneous tissue was performed using a tissue nipper and a chisel blade. Excisional debridement of all the necrotic nonviable tissue down to healthy bleeding viable tissue was performed with post-debridement measurements same as pre-. #3 the wound was cleansed and dry sterile dressing applied. #4 Continue antibiotics as per infection disease, Dr. Megan Salon. #5 Pt has appt at the [facility] on 02/28/2017. #6 Continue using Medihoney at home. #7 Return to clinic in 2 weeks.   Edrick Kins, DPM [facility]  Dr. Edrick Kins,  DPM    [name]                                        [place], [place] [address]                Office [phone]  Fax [phone]

## 2017-01-31 ENCOUNTER — Encounter: Payer: Self-pay | Admitting: Internal Medicine

## 2017-01-31 ENCOUNTER — Ambulatory Visit (INDEPENDENT_AMBULATORY_CARE_PROVIDER_SITE_OTHER): Payer: Medicare Other | Admitting: Internal Medicine

## 2017-01-31 DIAGNOSIS — I70235 Atherosclerosis of native arteries of right leg with ulceration of other part of foot: Secondary | ICD-10-CM | POA: Diagnosis not present

## 2017-01-31 DIAGNOSIS — M86671 Other chronic osteomyelitis, right ankle and foot: Secondary | ICD-10-CM

## 2017-01-31 NOTE — Assessment & Plan Note (Signed)
She most likely has polymicrobial, subacute to chronic osteomyelitis of the right first metatarsal stump. Recent cultures have grown MRSA and Pseudomonas. She is tolerating her current antibiotics. She is aware that ciprofloxacin has the potential to accentuate the effects of insulin and cause hypoglycemia. She monitors her sugars frequently and has not had any problems to date. I will have her continue them in follow-up in 6 weeks. I talked to her about the importance of better glycemic control offloading of pressure and wound care as well.

## 2017-01-31 NOTE — Progress Notes (Signed)
[facility] for Infectious Disease  Patient Active Problem List   Diagnosis Date Noted  . Chronic osteomyelitis of right foot ([place]) 01/16/2017    Priority: High  . Status post transmetatarsal amputation of right foot ([place]) 09/26/2014    Priority: High  . Foot ulcer due to secondary DM ([place]) 05/09/2011    Priority: High  . Diabetes mellitus type 1 with peripheral artery disease ([place]) 01/16/2017  . Heart murmur 11/03/2015  . Protein-calorie malnutrition, moderate ([place]) 10/18/2014  . History of DVT (deep vein thrombosis)   . Chronic anemia 10/13/2014  . DM type 1 causing renal disease ([place]) 06/13/2013  . CKD (chronic kidney disease), stage III ([place]) 06/12/2013  . Charcot foot due to diabetes mellitus ([place]) 05/09/2011  . Type 1 diabetes mellitus with neurological manifestations, uncontrolled ([place])   . Hypertension   . Hypercholesteremia     Patient's Medications  New Prescriptions   No medications on file  Previous Medications   ALBUTEROL (PROVENTIL HFA;VENTOLIN HFA) 108 (90 BASE) MCG/ACT INHALER    Inhale 2 puffs into the lungs every 6 (six) hours as needed for wheezing or shortness of breath. Reported on 04/15/2015   AMLODIPINE (NORVASC) 2.5 MG TABLET    Take 1 tablet (2.5 mg total) by mouth daily.   ASPIRIN 325 MG TABLET    Take 325 mg by mouth daily.   CALCITRIOL (ROCALTROL) 0.5 MCG CAPSULE    Take 1 capsule by mouth daily.   CALCIUM-PHOSPHORUS-VITAMIN D (CALCIUM GUMMIES) [id number] MG-MG-UNIT CHEW    Chew 2 each by mouth daily.   CINNAMON PO    Take 1 tablet by mouth daily.   CIPROFLOXACIN (CIPRO) 500 MG TABLET    Take 1 tablet (500 mg total) by mouth 2 (two) times daily.   CONTINUOUS BLOOD GLUC RECEIVER (FREESTYLE LIBRE READER) DEVI    1 Device by Does not apply route as directed.   CONTINUOUS BLOOD GLUC SENSOR (FREESTYLE LIBRE SENSOR SYSTEM) MISC    Use 1 sensor every 10 days   CYANOCOBALAMIN (VITAMIN B12 PO)    Take 1 each by mouth daily.   FOLIC ACID  (FOLVITE) 1 MG TABLET    Take 1 mg by mouth daily.   FUROSEMIDE (LASIX) 40 MG TABLET    Take 40 mg by mouth daily as needed for fluid.    GABAPENTIN (NEURONTIN) 300 MG CAPSULE    Take 1 capsule (300 mg total) by mouth 2 (two) times daily.   GLUCOSE BLOOD (CONTOUR NEXT TEST) TEST STRIP    USE AS INSTRUCTED TO CHECK BLOOD SUGAR 4 TIMES DAILY Dx Code E10.65   HUMALOG 100 UNIT/ML INJECTION    CALL MD IF <70, IF 151-200 =2UNITS,201-250 =4 UNITS,251-300 =6 UNITS, 301-350 =8 UNITS,351-400 =10 U   INSULIN HUMAN (INSULIN PUMP) SOLN    Inject into the skin. 15 carbs = 1 unit, every 50 sugars = 1 unit   LINEZOLID (ZYVOX) 600 MG TABLET    Take 1 tablet (600 mg total) by mouth 2 (two) times daily.   METRONIDAZOLE (FLAGYL) 500 MG TABLET    Take 1 tablet (500 mg total) by mouth 3 (three) times daily.   MULTIPLE MINERALS-VITAMINS (NUTRA-SUPPORT BONE) CAPS    Take 1 each by mouth daily.   MULTIPLE VITAMINS-MINERALS (VISION PLUS) CAPS    Take 1 each by mouth daily.   NORETHINDRONE (AYGESTIN) 5 MG TABLET    Take 1 tablet by mouth See admin instructions. Take 1 tablet  daily for 14 days and off 14 days.Marland Kitchen   ONDANSETRON (ZOFRAN-ODT) 4 MG DISINTEGRATING TABLET    DISSOLVE 1 TABLET ON TONGUE EVERY 8 HOURS AS NEEDED FOR NAUSEA   PRAVASTATIN (PRAVACHOL) 40 MG TABLET    Take 1 tablet (40 mg total) by mouth daily.   PROBIOTIC CAPS    Take 1 capsule by mouth daily.   PYRIDOXINE (B-6) 100 MG TABLET    Take 100 mg by mouth daily. Reported on 04/15/2015   TRAMADOL (ULTRAM) 50 MG TABLET    Take 1 tablet (50 mg total) by mouth every 6 (six) hours as needed.   VITAMIN C (ASCORBIC ACID) 500 MG TABLET    Take 500 mg by mouth daily.  Modified Medications   No medications on file  Discontinued Medications   No medications on file    Subjective: [name] is in for her routine follow-up visit. She is a 46 year old female with poorly controlled type 1 diabetes, peripheral neuropathy, Charcot arthropathy and peripheral artery disease who  was had multiple diabetic foot infections and prior surgeries. We have seen her previously in 2013 and again in 2016 when she had foot infections and MRSA bacteremia. Since that time she has undergone right transmetatarsal amputation and left fifth ray amputation. About one year ago she developed left foot abscess and osteomyelitis and underwent incision and drainage in January. Operative cultures grew MRSA. She's been on multiple rounds of antibiotics and finally healed that wound. She had a bland plantar ulcer under her right transmetatarsal stump. That has been debrided serially over the past 6 months. Cultures obtained in July showed multiple organisms.   She tells me that in September after standing on her feet at the [facility] for long periods of time she developed a large blister over the end of her right transmetatarsal stump that became infected. Wound culture obtained on 12/24/2016 grew Pseudomonas. She developed visible bone of the first metatarsal stump in the ulcer bed. An MRI of her right foot obtained on 01/01/2017 showed "soft tissue ulcer along the plantar aspect of the first metatarsal base with osteomyelitis of the first metatarsal stump. Mild marrow edema in the second metatarsal stump without cortical destruction which likely reflects reactive marrow edema secondary to adjacent inflammation."  I saw her on 01/16/2017 and recommended starting oral ciprofloxacin to see if she can tolerate it (it had been listed on her allergy list). Fortunately she has had no problems tolerating it and she was able to start on metronidazole the following day. I recommended also starting oral linezolid but that required prior authorization and she was only able to started yesterday. She had been using Medihoney and CBD oil on the ulcer but stopped using the CBD oil recently. She believed that the CBD oil caused increased drainage from the ulcer. She is tolerating her antibiotics well. She does have some  intermittent nausea and vomiting that is long-standing. She has not had any problems with hypoglycemia.  Review of Systems: Review of Systems  Constitutional: Negative for chills, diaphoresis, fever, malaise/fatigue and weight loss.  HENT: Negative for sore throat.   Respiratory: Negative for cough, sputum production and shortness of breath.   Cardiovascular: Negative for chest pain.  Gastrointestinal: Positive for nausea and vomiting. Negative for abdominal pain, diarrhea and heartburn.  Genitourinary: Negative for dysuria and frequency.  Musculoskeletal: Negative for joint pain and myalgias.  Skin: Negative for rash.  Neurological: Positive for sensory change. Negative for dizziness and headaches.    Past Medical  History:  Diagnosis Date  . Anemia   . C. difficile diarrhea 09/26/2014  . Cataracts, both eyes   . Cellulitis of right foot 06/02/2014  . CKD (chronic kidney disease) stage 3, GFR 30-59 ml/min (HCC)    sees Dr Justin Mend- Stage IV  . Diabetic ulcer of right foot ([place]) 06/02/2014  . DVT (deep venous thrombosis) ([place]) 10/2014   one in each one in leg- PICC line   . H/O seasonal allergies   . Heart murmur    told once when she was pregnant- no furter mention- was in notes 11/2015- had Echo 8 /4/17  . History of blood transfusion   . Hypercholesteremia   . Hypertension   . Left foot infection   . Neuropathy    feet  . Neuropathy in diabetes ([place])   . Peripheral vascular disease ([place])   . Staphylococcus aureus bacteremia 10/2014  . Type 1 diabetes [facility])    onset age 59    Social History  Substance Use Topics  . Smoking status: Former Smoker    Years: 15.00    Quit date: 05/16/2008  . Smokeless tobacco: Never Used  . Alcohol use Yes     Comment: Occasional    Family History  Problem Relation Age of Onset  . Hyperlipidemia Mother   . Dementia Mother     Allergies  Allergen Reactions  . Cleocin [Clindamycin Hcl] Diarrhea    Diarrhea   . Lisinopril Other (See  Comments)    Elevated potassium per pt report  . Amoxicillin Diarrhea and Nausea Only  . Bactrim [Sulfamethoxazole-Trimethoprim] Diarrhea and Nausea Only  . Ciprofloxacin Itching    Objective: Vitals:   01/31/17 1409  BP: (!) 142/96  Pulse: 96  Temp: 98.3 F (36.8 C)  TempSrc: Oral  Weight: 130 lb (59 kg)  Height: 5\' 7"  (1.702 m)   Body mass index is 20.36 kg/m.  Physical Exam  Constitutional: She is oriented to person, place, and time.  She is in good spirits.  Musculoskeletal:  The plantar ulcer under her right first metatarsal stump is largely unchanged over the past 2 weeks. There is no surrounding cellulitis or fluctuance. There is a small amount of serosanguineous dressing on her gauze dressing. There is no odor. I do not see any exposed bone but [name] recent note mentions a small amount of tunneling down to the metatarsal.  Neurological: She is alert and oriented to person, place, and time.  Skin: No rash noted.  Psychiatric: Mood and affect normal.    Lab Results    Problem List Items Addressed This Visit      High   Chronic osteomyelitis of right foot ([place])    She most likely has polymicrobial, subacute to chronic osteomyelitis of the right first metatarsal stump. Recent cultures have grown MRSA and Pseudomonas. She is tolerating her current antibiotics. She is aware that ciprofloxacin has the potential to accentuate the effects of insulin and cause hypoglycemia. She monitors her sugars frequently and has not had any problems to date. I will have her continue them in follow-up in 6 weeks. I talked to her about the importance of better glycemic control offloading of pressure and wound care as well.          Michel Bickers, MD [facility] for [facility] [phone] pager   [phone] cell 01/31/2017, 2:50 PM

## 2017-02-11 ENCOUNTER — Ambulatory Visit (INDEPENDENT_AMBULATORY_CARE_PROVIDER_SITE_OTHER): Payer: Medicare Other

## 2017-02-11 ENCOUNTER — Encounter: Payer: Self-pay | Admitting: Podiatry

## 2017-02-11 ENCOUNTER — Ambulatory Visit (INDEPENDENT_AMBULATORY_CARE_PROVIDER_SITE_OTHER): Payer: Medicare Other | Admitting: Podiatry

## 2017-02-11 DIAGNOSIS — L97519 Non-pressure chronic ulcer of other part of right foot with unspecified severity: Secondary | ICD-10-CM

## 2017-02-11 DIAGNOSIS — L97529 Non-pressure chronic ulcer of other part of left foot with unspecified severity: Secondary | ICD-10-CM

## 2017-02-11 DIAGNOSIS — L97512 Non-pressure chronic ulcer of other part of right foot with fat layer exposed: Secondary | ICD-10-CM

## 2017-02-11 DIAGNOSIS — L97522 Non-pressure chronic ulcer of other part of left foot with fat layer exposed: Secondary | ICD-10-CM

## 2017-02-11 DIAGNOSIS — I70245 Atherosclerosis of native arteries of left leg with ulceration of other part of foot: Secondary | ICD-10-CM

## 2017-02-11 DIAGNOSIS — I70235 Atherosclerosis of native arteries of right leg with ulceration of other part of foot: Secondary | ICD-10-CM

## 2017-02-11 DIAGNOSIS — E0842 Diabetes mellitus due to underlying condition with diabetic polyneuropathy: Secondary | ICD-10-CM

## 2017-02-11 LAB — HM DIABETES FOOT EXAM

## 2017-02-12 NOTE — Progress Notes (Signed)
Patient dispensed crow walker (L) and partial foot walker (R); patient was able to don and all indications are good fit and function.

## 2017-02-13 NOTE — Progress Notes (Signed)
Subjective:  Patient with a history of uncontrolled diabetes mellitus presents today for follow-up treatment and evaluation regarding bilateral foot ulcerations. She reports applying Medihoney as directed and is still on IV antibiotics per Dr. Megan Salon with infectious disease. She is here for further evaluation and treatment.    Past Medical History:  Diagnosis Date  . Anemia   . C. difficile diarrhea 09/26/2014  . Cataracts, both eyes   . Cellulitis of right foot 06/02/2014  . CKD (chronic kidney disease) stage 3, GFR 30-59 ml/min (HCC)    sees Dr Justin Mend- Stage IV  . Diabetic ulcer of right foot ([place]) 06/02/2014  . DVT (deep venous thrombosis) ([place]) 10/2014   one in each one in leg- PICC line   . H/O seasonal allergies   . Heart murmur    told once when she was pregnant- no furter mention- was in notes 11/2015- had Echo 8 /4/17  . History of blood transfusion   . Hypercholesteremia   . Hypertension   . Left foot infection   . Neuropathy    feet  . Neuropathy in diabetes ([place])   . Peripheral vascular disease ([place])   . Staphylococcus aureus bacteremia 10/2014  . Type 1 diabetes ([place])    onset age 18     Objective/Physical Exam General: The patient is alert and oriented x3 in no acute distress.  Dermatology:  Wound #1 noted to the lateral aspect of the left midfoot measuring approximately 0.7 x 0.7 x 0.2 cm (LxWxD).  To the noted ulceration(s), there is no eschar. There is a minimal amount of slough, fibrin, and necrotic tissue noted. Granulation tissue and wound base is red. There is Minimal serosanguineous drainage noted. There is no exposed bone muscle-tendon ligament or joint. There is no malodor. Periwound integrity is  Callus.Skin is warm, dry and supple bilateral lower extremities.  Wound #2 is noted to the right transmetatarsal amputation stump measuring approximately  2.0  1.1 x 0.3 cm (LxWxD). There is some undermining noted to the distal aspect of the ulceration site which  does extend to the level of possible bone. Depth of the undermining is partially 0.8 cm To the noted ulceration wound #2 there is exposed bone. There is a moderate amount of serosanguineous drainage noted. Negative for malodor. No proximal streaking. Periwound integrity is intact. Wound base is granular and red.  Vascular:  No edema or erythema noted. Capillary refill within normal limits.There is no significant changes in temperature compared to the contralateral limb of the right lower extremity   Neurological: Epicritic and protective threshold absent bilaterally.   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 Joint spaces preserved. No fracture/dislocation/boney destruction.   Assessment: #1 bilateral foot ulcerations secondary to diabetes mellitus #2 diabetes mellitus w/ peripheral neuropathy #3 history of partial foot amputations bilateral #4 osteomyelitis right foot  Plan of Care:  #1 Patient was evaluated. X-Rays reviewed.  #2 medically necessary excisional debridement including subcutaneous tissue was performed using a tissue nipper and a chisel blade. Excisional debridement of all the necrotic nonviable tissue down to healthy bleeding viable tissue was performed with post-debridement measurements same as pre-. #3 the wound was cleansed and dry sterile dressing applied. #4 Continue antibiotics as per infection disease, Dr. Megan Salon. #5 Pt has appt at the [facility] on 02/28/2017 to pursue possible hyperbarics. #6 Continue using Medihoney at home. #7 Return to clinic when necessary if we  need to pursue skin grafting.   Edrick Kins, DPM [facility]  Dr. Edrick Kins, [address]                                        [place], [place] [address]                Office [phone]  Fax [phone]

## 2017-02-19 ENCOUNTER — Encounter (HOSPITAL_BASED_OUTPATIENT_CLINIC_OR_DEPARTMENT_OTHER): Payer: Medicare Other

## 2017-02-28 ENCOUNTER — Ambulatory Visit (INDEPENDENT_AMBULATORY_CARE_PROVIDER_SITE_OTHER): Payer: Medicare Other | Admitting: Internal Medicine

## 2017-02-28 ENCOUNTER — Encounter: Payer: Self-pay | Admitting: Internal Medicine

## 2017-02-28 ENCOUNTER — Encounter (HOSPITAL_BASED_OUTPATIENT_CLINIC_OR_DEPARTMENT_OTHER): Payer: Medicare Other | Attending: Internal Medicine

## 2017-02-28 ENCOUNTER — Other Ambulatory Visit: Payer: Self-pay

## 2017-02-28 DIAGNOSIS — M86371 Chronic multifocal osteomyelitis, right ankle and foot: Secondary | ICD-10-CM | POA: Insufficient documentation

## 2017-02-28 DIAGNOSIS — E1022 Type 1 diabetes mellitus with diabetic chronic kidney disease: Secondary | ICD-10-CM | POA: Insufficient documentation

## 2017-02-28 DIAGNOSIS — M86671 Other chronic osteomyelitis, right ankle and foot: Secondary | ICD-10-CM

## 2017-02-28 DIAGNOSIS — E10621 Type 1 diabetes mellitus with foot ulcer: Secondary | ICD-10-CM | POA: Insufficient documentation

## 2017-02-28 DIAGNOSIS — E1065 Type 1 diabetes mellitus with hyperglycemia: Secondary | ICD-10-CM | POA: Diagnosis not present

## 2017-02-28 DIAGNOSIS — E1051 Type 1 diabetes mellitus with diabetic peripheral angiopathy without gangrene: Secondary | ICD-10-CM | POA: Diagnosis not present

## 2017-02-28 DIAGNOSIS — I129 Hypertensive chronic kidney disease with stage 1 through stage 4 chronic kidney disease, or unspecified chronic kidney disease: Secondary | ICD-10-CM | POA: Diagnosis not present

## 2017-02-28 DIAGNOSIS — N189 Chronic kidney disease, unspecified: Secondary | ICD-10-CM | POA: Diagnosis not present

## 2017-02-28 DIAGNOSIS — E10610 Type 1 diabetes mellitus with diabetic neuropathic arthropathy: Secondary | ICD-10-CM | POA: Insufficient documentation

## 2017-02-28 DIAGNOSIS — Z95820 Peripheral vascular angioplasty status with implants and grafts: Secondary | ICD-10-CM | POA: Diagnosis not present

## 2017-02-28 DIAGNOSIS — L97422 Non-pressure chronic ulcer of left heel and midfoot with fat layer exposed: Secondary | ICD-10-CM | POA: Insufficient documentation

## 2017-02-28 DIAGNOSIS — G4730 Sleep apnea, unspecified: Secondary | ICD-10-CM | POA: Insufficient documentation

## 2017-02-28 DIAGNOSIS — E1069 Type 1 diabetes mellitus with other specified complication: Secondary | ICD-10-CM | POA: Insufficient documentation

## 2017-02-28 DIAGNOSIS — Z89431 Acquired absence of right foot: Secondary | ICD-10-CM | POA: Insufficient documentation

## 2017-02-28 DIAGNOSIS — Z87891 Personal history of nicotine dependence: Secondary | ICD-10-CM | POA: Insufficient documentation

## 2017-02-28 DIAGNOSIS — Z86718 Personal history of other venous thrombosis and embolism: Secondary | ICD-10-CM | POA: Diagnosis not present

## 2017-02-28 DIAGNOSIS — L97412 Non-pressure chronic ulcer of right heel and midfoot with fat layer exposed: Secondary | ICD-10-CM | POA: Insufficient documentation

## 2017-02-28 DIAGNOSIS — E1040 Type 1 diabetes mellitus with diabetic neuropathy, unspecified: Secondary | ICD-10-CM | POA: Diagnosis not present

## 2017-02-28 DIAGNOSIS — Z89422 Acquired absence of other left toe(s): Secondary | ICD-10-CM | POA: Insufficient documentation

## 2017-02-28 DIAGNOSIS — Z7984 Long term (current) use of oral hypoglycemic drugs: Secondary | ICD-10-CM | POA: Insufficient documentation

## 2017-02-28 DIAGNOSIS — I70235 Atherosclerosis of native arteries of right leg with ulceration of other part of foot: Secondary | ICD-10-CM | POA: Diagnosis not present

## 2017-02-28 MED ORDER — FREESTYLE LIBRE READER DEVI: 1.0000 | 1 refills | Status: DC

## 2017-02-28 MED ORDER — FREESTYLE LIBRE SENSOR SYSTEM MISC: 4 refills | Status: DC

## 2017-02-28 NOTE — Progress Notes (Signed)
[facility] for Infectious Disease  Patient Active Problem List   Diagnosis Date Noted  . Chronic osteomyelitis of right foot ([place]) 01/16/2017    Priority: High  . Status post transmetatarsal amputation of right foot ([place]) 09/26/2014    Priority: High  . Foot ulcer due to secondary DM ([place]) 05/09/2011    Priority: High  . Diabetes mellitus type 1 with peripheral artery disease ([place]) 01/16/2017  . Heart murmur 11/03/2015  . Protein-calorie malnutrition, moderate ([place]) 10/18/2014  . History of DVT (deep vein thrombosis)   . Chronic anemia 10/13/2014  . DM type 1 causing renal disease ([place]) 06/13/2013  . CKD (chronic kidney disease), stage III ([place]) 06/12/2013  . Charcot foot due to diabetes mellitus ([place]) 05/09/2011  . Type 1 diabetes mellitus with neurological manifestations, uncontrolled ([place])   . Hypertension   . Hypercholesteremia       Medication List        Accurate as of 02/28/17  3:27 PM. Always use your most recent med list.          albuterol 108 (90 Base) MCG/ACT inhaler Commonly known as:  PROVENTIL HFA;VENTOLIN HFA   amLODipine 2.5 MG tablet Commonly known as:  NORVASC Take 1 tablet (2.5 mg total) by mouth daily.   aspirin 325 MG tablet   calcitRIOL 0.5 MCG capsule Commonly known as:  ROCALTROL   CALCIUM GUMMIES 250-100-500 MG-MG-UNIT Chew Generic drug:  Calcium-Phosphorus-Vitamin D   CINNAMON PO   folic acid 1 MG tablet Commonly known as:  FOLVITE   FREESTYLE LIBRE READER Devi 1 Device by Does not apply route as directed.   FREESTYLE LIBRE SENSOR SYSTEM Misc Use 1 sensor every 10 days   furosemide 40 MG tablet Commonly known as:  LASIX   gabapentin 300 MG capsule Commonly known as:  NEURONTIN Take 1 capsule (300 mg total) by mouth 2 (two) times daily.   glucose blood test strip Commonly known as:  CONTOUR NEXT TEST USE AS INSTRUCTED TO CHECK BLOOD SUGAR 4 TIMES DAILY Dx Code E10.65   HUMALOG 100 UNIT/ML  injection Generic drug:  insulin lispro CALL MD IF <70, IF 151-200 =2UNITS,201-250 =4 UNITS,251-300 =6 UNITS, 301-350 =8 UNITS,351-400 =10 U   insulin pump Soln   norethindrone 5 MG tablet Commonly known as:  AYGESTIN   NUTRA-SUPPORT BONE Caps   ondansetron 4 MG disintegrating tablet Commonly known as:  ZOFRAN-ODT   pravastatin 40 MG tablet Commonly known as:  PRAVACHOL Take 1 tablet (40 mg total) by mouth daily.   Probiotic Caps Take 1 capsule by mouth daily.   pyridoxine 100 MG tablet Commonly known as:  B-6   traMADol 50 MG tablet Commonly known as:  ULTRAM Take 1 tablet (50 mg total) by mouth every 6 (six) hours as needed.   VISION PLUS Caps   VITAMIN B12 PO   vitamin C 500 MG tablet Commonly known as:  ASCORBIC ACID       Subjective: [name] is in for her routine follow-up visit. She has now been on ciprofloxacin and metronidazole for 6 weeks and linezolid for 4 weeks for her right foot osteomyelitis involving the first metatarsal stump. She has had a bad taste in her mouth with some nausea since restarting metronidazole. She has developed a rash on her face and back since starting linezolid. Other than that she has tolerated her antibiotics well. She was seen at the wound Center this morning and had her wounds redressed.  A home nurse continues to come out and change dressings several times each week. She has pictures and has been told that her wounds are improving. Notes indicate that there is no longer any exposed bone. She continues to use medihoney and CBD oil on the wounds.  Review of Systems: Review of Systems  Constitutional: Negative for chills, diaphoresis and fever.  Gastrointestinal: Positive for nausea. Negative for abdominal pain, diarrhea and vomiting.  Skin: Positive for itching and rash.    Past Medical History:  Diagnosis Date  . Anemia   . C. difficile diarrhea 09/26/2014  . Cataracts, both eyes   . Cellulitis of right foot 06/02/2014  . CKD  (chronic kidney disease) stage 3, GFR 30-59 ml/min (HCC)    sees Dr Justin Mend- Stage IV  . Diabetic ulcer of right foot ([place]) 06/02/2014  . DVT (deep venous thrombosis) ([place]) 10/2014   one in each one in leg- PICC line   . H/O seasonal allergies   . Heart murmur    told once when she was pregnant- no furter mention- was in notes 11/2015- had Echo 8 /4/17  . History of blood transfusion   . Hypercholesteremia   . Hypertension   . Left foot infection   . Neuropathy    feet  . Neuropathy in diabetes ([place])   . Peripheral vascular disease ([place])   . Staphylococcus aureus bacteremia 10/2014  . Type 1 diabetes [facility])    onset age 88    Social History   Tobacco Use  . Smoking status: Former Smoker    Years: 15.00    Last attempt to quit: 05/16/2008    Years since quitting: 8.7  . Smokeless tobacco: Never Used  Substance Use Topics  . Alcohol use: Yes    Comment: Occasional  . Drug use: No    Family History  Problem Relation Age of Onset  . Hyperlipidemia Mother   . Dementia Mother     Allergies  Allergen Reactions  . Cleocin [Clindamycin Hcl] Diarrhea    Diarrhea   . Lisinopril Other (See Comments)    Elevated potassium per pt report  . Amoxicillin Diarrhea and Nausea Only  . Bactrim [Sulfamethoxazole-Trimethoprim] Diarrhea and Nausea Only  . Ciprofloxacin Itching    Objective: Vitals:   02/28/17 1423  BP: (!) 162/93  Pulse: 97  Temp: 98.8 F (37.1 C)  TempSrc: Oral  Weight: 125 lb (56.7 kg)  Height: 5\' 7"  (1.702 m)   Body mass index is 19.58 kg/m.  Physical Exam  Constitutional: She is oriented to person, place, and time.  She is talkative and in good spirits as usual.  Musculoskeletal:  I did not reexamine her foot today as her wounds were just redressed.  Neurological: She is alert and oriented to person, place, and time.  Skin: Rash noted.  She has a papular and occasionally pustular rash on her forehead and back.  Psychiatric: Mood and affect normal.     Lab Results Sed Rate  Date Value  01/16/2017 67 mm/h (H)  11/03/2015 61 mm/hr (H)  10/14/2014 83 mm/hr (H)   CRP  Date Value  01/16/2017 10.6 mg/L (H)  12/18/2016 17.3 mg/L (H)  11/03/2015 2.6 mg/dL (H)  10/14/2014 8.1 mg/dL (H)     Problem List Items Addressed This Visit      High   Chronic osteomyelitis of right foot ([place])    Her right foot is improving following 6 weeks of broad empiric antibiotic therapy targeting MRSA, Pseudomonas and  anaerobes. I'm hopeful that her osteomyelitis has been cured. She is probably having some side effect of her antibiotics. I will repeat her blood work today and have her stop antibiotics now. She will follow-up here in 2 weeks.      Relevant Orders   C-reactive protein   Sedimentation rate   CBC   Basic metabolic panel       Michel Bickers, MD [facility] for [place] [phone] pager   [phone] cell 02/28/2017, 3:27 PM

## 2017-02-28 NOTE — Assessment & Plan Note (Signed)
Her right foot is improving following 6 weeks of broad empiric antibiotic therapy targeting MRSA, Pseudomonas and anaerobes. I'm hopeful that her osteomyelitis has been cured. She is probably having some side effect of her antibiotics. I will repeat her blood work today and have her stop antibiotics now. She will follow-up here in 2 weeks.

## 2017-03-01 LAB — CBC
HEMATOCRIT: 34.8 % — AB (ref 35.0–45.0)
Hemoglobin: 11 g/dL — ABNORMAL LOW (ref 11.7–15.5)
MCH: 26.6 pg — AB (ref 27.0–33.0)
MCHC: 31.6 g/dL — AB (ref 32.0–36.0)
MCV: 84.1 fL (ref 80.0–100.0)
MPV: 11.6 fL (ref 7.5–12.5)
Platelets: 297 10*3/uL (ref 140–400)
RBC: 4.14 10*6/uL (ref 3.80–5.10)
RDW: 15.7 % — AB (ref 11.0–15.0)
WBC: 7.1 10*3/uL (ref 3.8–10.8)

## 2017-03-01 LAB — BASIC METABOLIC PANEL
BUN / CREAT RATIO: 15 (calc) (ref 6–22)
BUN: 39 mg/dL — AB (ref 7–25)
CALCIUM: 8 mg/dL — AB (ref 8.6–10.2)
CO2: 16 mmol/L — AB (ref 20–32)
Chloride: 104 mmol/L (ref 98–110)
Creat: 2.59 mg/dL — ABNORMAL HIGH (ref 0.50–1.10)
Glucose, Bld: 328 mg/dL — ABNORMAL HIGH (ref 65–99)
POTASSIUM: 5.9 mmol/L — AB (ref 3.5–5.3)
SODIUM: 133 mmol/L — AB (ref 135–146)

## 2017-03-01 LAB — C-REACTIVE PROTEIN: CRP: 0.6 mg/L (ref <8.0)

## 2017-03-01 LAB — SEDIMENTATION RATE: Sed Rate: 45 mm/h — ABNORMAL HIGH (ref 0–20)

## 2017-03-14 ENCOUNTER — Ambulatory Visit (INDEPENDENT_AMBULATORY_CARE_PROVIDER_SITE_OTHER): Payer: Medicare Other | Admitting: Internal Medicine

## 2017-03-14 DIAGNOSIS — I70235 Atherosclerosis of native arteries of right leg with ulceration of other part of foot: Secondary | ICD-10-CM | POA: Diagnosis not present

## 2017-03-14 DIAGNOSIS — M86671 Other chronic osteomyelitis, right ankle and foot: Secondary | ICD-10-CM | POA: Diagnosis present

## 2017-03-14 NOTE — Progress Notes (Signed)
[facility] for Infectious Disease  Patient Active Problem List   Diagnosis Date Noted  . Chronic osteomyelitis of right foot ([place]) 01/16/2017    Priority: High  . Status post transmetatarsal amputation of right foot ([place]) 09/26/2014    Priority: High  . Foot ulcer due to secondary DM ([place]) 05/09/2011    Priority: High  . Diabetes mellitus type 1 with peripheral artery disease ([place]) 01/16/2017  . Heart murmur 11/03/2015  . Protein-calorie malnutrition, moderate ([place]) 10/18/2014  . History of DVT (deep vein thrombosis)   . Chronic anemia 10/13/2014  . DM type 1 causing renal disease ([place]) 06/13/2013  . CKD (chronic kidney disease), stage III ([place]) 06/12/2013  . Charcot foot due to diabetes mellitus ([place]) 05/09/2011  . Type 1 diabetes mellitus with neurological manifestations, uncontrolled ([place])   . Hypertension   . Hypercholesteremia       Medication List        Accurate as of 03/14/17  2:33 PM. Always use your most recent med list.          albuterol 108 (90 Base) MCG/ACT inhaler Commonly known as:  PROVENTIL HFA;VENTOLIN HFA   amLODipine 2.5 MG tablet Commonly known as:  NORVASC Take 1 tablet (2.5 mg total) by mouth daily.   aspirin 325 MG tablet   calcitRIOL 0.5 MCG capsule Commonly known as:  ROCALTROL   CALCIUM GUMMIES 250-100-500 MG-MG-UNIT Chew Generic drug:  Calcium-Phosphorus-Vitamin D   CINNAMON PO   folic acid 1 MG tablet Commonly known as:  FOLVITE   FREESTYLE LIBRE READER Devi 1 Device by Does not apply route as directed.   FREESTYLE LIBRE SENSOR SYSTEM Misc Use 1 sensor every 10 days   furosemide 40 MG tablet Commonly known as:  LASIX   gabapentin 300 MG capsule Commonly known as:  NEURONTIN Take 1 capsule (300 mg total) by mouth 2 (two) times daily.   glucose blood test strip Commonly known as:  CONTOUR NEXT TEST USE AS INSTRUCTED TO CHECK BLOOD SUGAR 4 TIMES DAILY Dx Code E10.65   HUMALOG 100 UNIT/ML  injection Generic drug:  insulin lispro CALL MD IF <70, IF 151-200 =2UNITS,201-250 =4 UNITS,251-300 =6 UNITS, 301-350 =8 UNITS,351-400 =10 U   insulin pump Soln   norethindrone 5 MG tablet Commonly known as:  AYGESTIN   NUTRA-SUPPORT BONE Caps   ondansetron 4 MG disintegrating tablet Commonly known as:  ZOFRAN-ODT   pravastatin 40 MG tablet Commonly known as:  PRAVACHOL Take 1 tablet (40 mg total) by mouth daily.   Probiotic Caps Take 1 capsule by mouth daily.   pyridoxine 100 MG tablet Commonly known as:  B-6   traMADol 50 MG tablet Commonly known as:  ULTRAM Take 1 tablet (50 mg total) by mouth every 6 (six) hours as needed.   VISION PLUS Caps   VITAMIN B12 PO   vitamin C 500 MG tablet Commonly known as:  ASCORBIC ACID       Subjective: [name] is in for her routine follow-up visit. She completed 6 weeks of antibiotic therapy with ciprofloxacin, metronidazole and linezolid on 02/28/2017 for her right foot osteomyelitis. Last week she noted some swelling and redness of her right leg from her ankle to her knee and restarted the linezolid on her own. Her home nurse continues to come out regularly to inspect the wound on the plantar surface of her right foot. She has been told that it is improving. She has not been back  to her podiatrist or the wound center since her last visit here. She continues to use medihoney and a CBD oil on her wound.  Review of Systems: Review of Systems  Constitutional: Negative for chills, diaphoresis and fever.  Eyes: Negative for blurred vision.  Gastrointestinal: Negative for abdominal pain, diarrhea, nausea and vomiting.  Musculoskeletal: Negative for joint pain.    Past Medical History:  Diagnosis Date  . Anemia   . C. difficile diarrhea 09/26/2014  . Cataracts, both eyes   . Cellulitis of right foot 06/02/2014  . CKD (chronic kidney disease) stage 3, GFR 30-59 ml/min (HCC)    sees Dr Justin Mend- Stage IV  . Diabetic ulcer of right foot  ([place]) 06/02/2014  . DVT (deep venous thrombosis) ([place]) 10/2014   one in each one in leg- PICC line   . H/O seasonal allergies   . Heart murmur    told once when she was pregnant- no furter mention- was in notes 11/2015- had Echo 8 /4/17  . History of blood transfusion   . Hypercholesteremia   . Hypertension   . Left foot infection   . Neuropathy    feet  . Neuropathy in diabetes ([place])   . Peripheral vascular disease ([place])   . Staphylococcus aureus bacteremia 10/2014  . Type 1 diabetes [facility])    onset age 55    Social History   Tobacco Use  . Smoking status: Former Smoker    Years: 15.00    Last attempt to quit: 05/16/2008    Years since quitting: 8.8  . Smokeless tobacco: Never Used  Substance Use Topics  . Alcohol use: Yes    Comment: Occasional  . Drug use: No    Family History  Problem Relation Age of Onset  . Hyperlipidemia Mother   . Dementia Mother     Allergies  Allergen Reactions  . Cleocin [Clindamycin Hcl] Diarrhea    Diarrhea   . Lisinopril Other (See Comments)    Elevated potassium per pt report  . Amoxicillin Diarrhea and Nausea Only  . Bactrim [Sulfamethoxazole-Trimethoprim] Diarrhea and Nausea Only  . Ciprofloxacin Itching    Objective: Vitals:   03/14/17 1405  BP: (!) 164/88  Pulse: 98  Temp: (!) 97.3 F (36.3 C)  Weight: 130 lb (59 kg)   Body mass index is 20.36 kg/m.  Physical Exam  Constitutional: She is oriented to person, place, and time.  She is in good spirits. Her facial rash has resolved.  Musculoskeletal:  The ulcer on the plantar surface of her right foot under her first metatarsal head continues to heal. There is now only a small area of granulation tissue exposed that is about the size of a pencil eraser. There is no evidence of active cellulitis of her lower leg.  Neurological: She is alert and oriented to person, place, and time.  Skin: No rash noted.  Psychiatric: Mood and affect normal.    Lab Results    Problem  List Items Addressed This Visit      High   Chronic osteomyelitis of right foot ([place])    I am hopeful that her osteomyelitis has been cured. I instructed her to stop linezolid now. She will follow-up in one week.          Michel Bickers, MD [facility] for [facility] [phone] pager   [phone] cell 03/14/2017, 2:33 PM

## 2017-03-14 NOTE — Assessment & Plan Note (Signed)
I am hopeful that her osteomyelitis has been cured. I instructed her to stop linezolid now. She will follow-up in one week.

## 2017-03-15 ENCOUNTER — Other Ambulatory Visit (INDEPENDENT_AMBULATORY_CARE_PROVIDER_SITE_OTHER): Payer: Medicare Other

## 2017-03-15 ENCOUNTER — Other Ambulatory Visit: Payer: Self-pay | Admitting: Endocrinology

## 2017-03-15 DIAGNOSIS — E1065 Type 1 diabetes mellitus with hyperglycemia: Secondary | ICD-10-CM

## 2017-03-15 LAB — BASIC METABOLIC PANEL
BUN: 40 mg/dL — AB (ref 6–23)
CALCIUM: 6.9 mg/dL — AB (ref 8.4–10.5)
CO2: 19 mEq/L (ref 19–32)
Chloride: 106 mEq/L (ref 96–112)
Creatinine, Ser: 2.29 mg/dL — ABNORMAL HIGH (ref 0.40–1.20)
GFR: 24.46 mL/min — AB (ref >60.00)
Glucose, Bld: 234 mg/dL — ABNORMAL HIGH (ref 70–99)
POTASSIUM: 4.9 meq/L (ref 3.5–5.1)
SODIUM: 135 meq/L (ref 135–145)

## 2017-03-16 LAB — FRUCTOSAMINE: Fructosamine: 357 umol/L — ABNORMAL HIGH (ref 0–285)

## 2017-03-18 ENCOUNTER — Ambulatory Visit: Payer: Medicare Other | Admitting: Endocrinology

## 2017-03-19 ENCOUNTER — Other Ambulatory Visit: Payer: Self-pay

## 2017-03-21 ENCOUNTER — Ambulatory Visit (INDEPENDENT_AMBULATORY_CARE_PROVIDER_SITE_OTHER): Payer: Medicare Other | Admitting: Internal Medicine

## 2017-03-21 ENCOUNTER — Encounter: Payer: Self-pay | Admitting: Internal Medicine

## 2017-03-21 DIAGNOSIS — I70235 Atherosclerosis of native arteries of right leg with ulceration of other part of foot: Secondary | ICD-10-CM

## 2017-03-21 DIAGNOSIS — M86671 Other chronic osteomyelitis, right ankle and foot: Secondary | ICD-10-CM

## 2017-03-21 NOTE — Progress Notes (Signed)
[facility] for Infectious Disease  Patient Active Problem List   Diagnosis Date Noted  . Chronic osteomyelitis of right foot ([place]) 01/16/2017    Priority: High  . Status post transmetatarsal amputation of right foot ([place]) 09/26/2014    Priority: High  . Foot ulcer due to secondary DM ([place]) 05/09/2011    Priority: High  . Diabetes mellitus type 1 with peripheral artery disease ([place]) 01/16/2017  . Heart murmur 11/03/2015  . Protein-calorie malnutrition, moderate ([place]) 10/18/2014  . History of DVT (deep vein thrombosis)   . Chronic anemia 10/13/2014  . DM type 1 causing renal disease ([place]) 06/13/2013  . CKD (chronic kidney disease), stage III ([place]) 06/12/2013  . Charcot foot due to diabetes mellitus ([place]) 05/09/2011  . Type 1 diabetes mellitus with neurological manifestations, uncontrolled ([place])   . Hypertension   . Hypercholesteremia       Medication List        Accurate as of 03/21/17  2:36 PM. Always use your most recent med list.          albuterol 108 (90 Base) MCG/ACT inhaler Commonly known as:  PROVENTIL HFA;VENTOLIN HFA   amLODipine 2.5 MG tablet Commonly known as:  NORVASC Take 1 tablet (2.5 mg total) by mouth daily.   aspirin 325 MG tablet   calcitRIOL 0.5 MCG capsule Commonly known as:  ROCALTROL   CALCIUM GUMMIES 250-100-500 MG-MG-UNIT Chew Generic drug:  Calcium-Phosphorus-Vitamin D   CINNAMON PO   folic acid 1 MG tablet Commonly known as:  FOLVITE   FREESTYLE LIBRE READER Devi 1 Device by Does not apply route as directed.   FREESTYLE LIBRE SENSOR SYSTEM Misc Use 1 sensor every 10 days   furosemide 40 MG tablet Commonly known as:  LASIX   gabapentin 300 MG capsule Commonly known as:  NEURONTIN Take 1 capsule (300 mg total) by mouth 2 (two) times daily.   glucose blood test strip Commonly known as:  CONTOUR NEXT TEST USE AS INSTRUCTED TO CHECK BLOOD SUGAR 4 TIMES DAILY Dx Code E10.65   HUMALOG 100 UNIT/ML  injection Generic drug:  insulin lispro CALL MD IF <70, IF 151-200 =2UNITS,201-250 =4 UNITS,251-300 =6 UNITS, 301-350 =8 UNITS,351-400 =10 U   insulin pump Soln   norethindrone 5 MG tablet Commonly known as:  AYGESTIN   NUTRA-SUPPORT BONE Caps   ondansetron 4 MG disintegrating tablet Commonly known as:  ZOFRAN-ODT   pravastatin 40 MG tablet Commonly known as:  PRAVACHOL Take 1 tablet (40 mg total) by mouth daily.   Probiotic Caps Take 1 capsule by mouth daily.   pyridoxine 100 MG tablet Commonly known as:  B-6   traMADol 50 MG tablet Commonly known as:  ULTRAM Take 1 tablet (50 mg total) by mouth every 6 (six) hours as needed.   VISION PLUS Caps   VITAMIN B12 PO   vitamin C 500 MG tablet Commonly known as:  ASCORBIC ACID       Subjective: [name] is in for her routine follow-up visit. She completed 6 weeks of antibiotic therapy with ciprofloxacin, metronidazole and linezolid on 02/28/2017 for her right foot osteomyelitis. She noted some swelling and redness of her right leg from her ankle to her knee and restarted the linezolid on her own.  I had her stop it on 03/14/2017.    Her home nurse continues to come out regularly to inspect the wound on the plantar surface of her right foot. She has been told  that it is improving. She has not been back to her podiatrist or the wound center since her last visit here. She continues to use medihoney and a CBD oil on her wound.  This morning she used a pumice stone on the callus around her plantar wound and has had some slight bleeding on her bandage.  Review of Systems: Review of Systems  Constitutional: Negative for chills, diaphoresis and fever.  Eyes: Negative for blurred vision.  Gastrointestinal: Negative for abdominal pain, diarrhea, nausea and vomiting.  Musculoskeletal: Negative for joint pain.    Past Medical History:  Diagnosis Date  . Anemia   . C. difficile diarrhea 09/26/2014  . Cataracts, both eyes   .  Cellulitis of right foot 06/02/2014  . CKD (chronic kidney disease) stage 3, GFR 30-59 ml/min (HCC)    sees Dr Justin Mend- Stage IV  . Diabetic ulcer of right foot ([place]) 06/02/2014  . DVT (deep venous thrombosis) ([place]) 10/2014   one in each one in leg- PICC line   . H/O seasonal allergies   . Heart murmur    told once when she was pregnant- no furter mention- was in notes 11/2015- had Echo 8 /4/17  . History of blood transfusion   . Hypercholesteremia   . Hypertension   . Left foot infection   . Neuropathy    feet  . Neuropathy in diabetes ([place])   . Peripheral vascular disease ([place])   . Staphylococcus aureus bacteremia 10/2014  . Type 1 diabetes [facility])    onset age 65    Social History   Tobacco Use  . Smoking status: Former Smoker    Years: 15.00    Last attempt to quit: 05/16/2008    Years since quitting: 8.8  . Smokeless tobacco: Never Used  Substance Use Topics  . Alcohol use: Yes    Comment: Occasional  . Drug use: No    Family History  Problem Relation Age of Onset  . Hyperlipidemia Mother   . Dementia Mother     Allergies  Allergen Reactions  . Cleocin [Clindamycin Hcl] Diarrhea    Diarrhea   . Lisinopril Other (See Comments)    Elevated potassium per pt report  . Amoxicillin Diarrhea and Nausea Only  . Bactrim [Sulfamethoxazole-Trimethoprim] Diarrhea and Nausea Only  . Ciprofloxacin Itching    Objective: Vitals:   03/21/17 1422  BP: (!) 158/80  Pulse: 93  Temp: 98.1 F (36.7 C)  TempSrc: Oral  Weight: 129 lb (58.5 kg)  Height: 5\' 7"  (1.702 m)   Body mass index is 20.2 kg/m.  Physical Exam  Constitutional: She is oriented to person, place, and time.  She is in good spirits. Her facial rash has resolved.  Musculoskeletal:  The ulcer on the plantar surface of her right foot under her first metatarsal head continues to heal. There is now only a small area of granulation tissue exposed that is about the size of a pencil eraser. There is no evidence of  active cellulitis of her lower leg.  The surrounding callus is abraded by the pumice stone.  There is some blood on the gauze dressing.  There is no other drainage or odor.  Neurological: She is alert and oriented to person, place, and time.  Skin: No rash noted.  Psychiatric: Mood and affect normal.    Lab Results    Problem List Items Addressed This Visit      High   Chronic osteomyelitis of right foot ([place])  She continues to improve after completing about 7 weeks of anti-therapy for right foot osteomyelitis.  She will follow-up here in 6 weeks.          Nancy Bickers, MD [facility] for [facility] [phone] pager   [phone] cell 03/21/2017, 2:36 PM

## 2017-03-21 NOTE — Assessment & Plan Note (Signed)
She continues to improve after completing about 7 weeks of anti-therapy for right foot osteomyelitis.  She will follow-up here in 6 weeks.

## 2017-03-31 IMAGING — MR MR FOOT*L* W/O CM
4 of 6 series · 19 of 40 positions shown · non-contrast
Comparison: MRI of the left foot 10/13/2014.

CLINICAL DATA: Diabetic patient with a history of multiple foot
infections and amputations. Redness, pain and swelling of the left
foot developing over the past 2 days.

EXAM:
MRI OF THE LEFT FOOT WITHOUT CONTRAST
TECHNIQUE: Multiplanar, multisequence MR imaging was performed. No intravenous
contrast was administered.

[Series 3: T1 · coronal · 4.0mm · 0.29mm/px · 9 of 36 slices shown (1 of 2)]
[im 1/36]
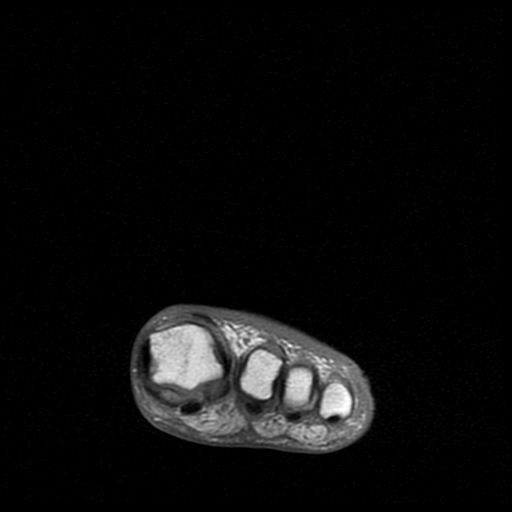
[im 5/36]
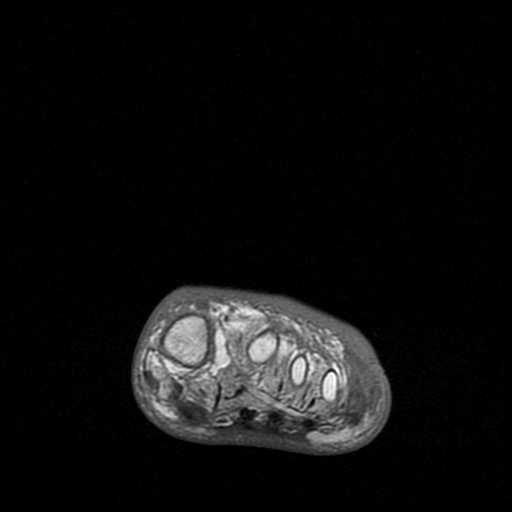
[im 9/36]
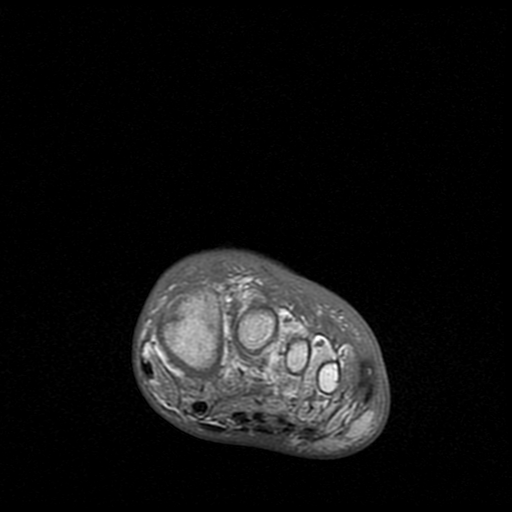
[im 14/36]
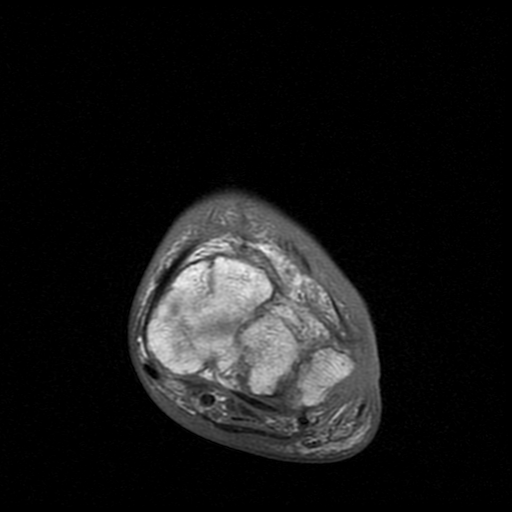
[im 18/36]
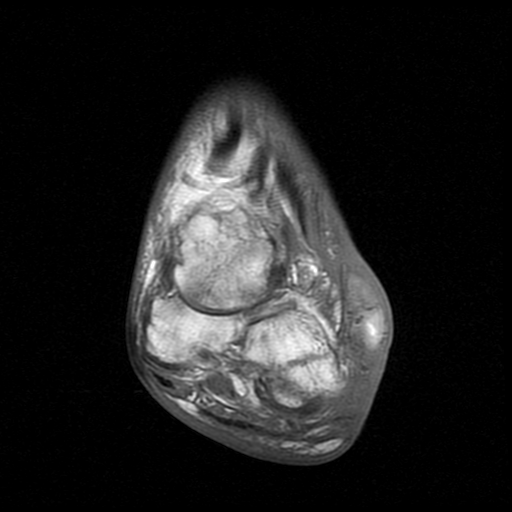
[im 22/36]
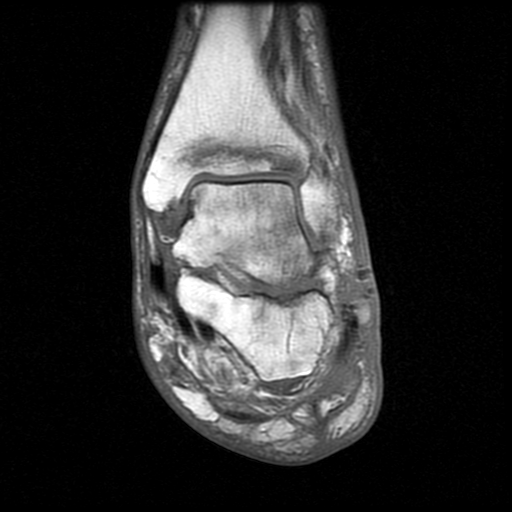
[im 27/36]
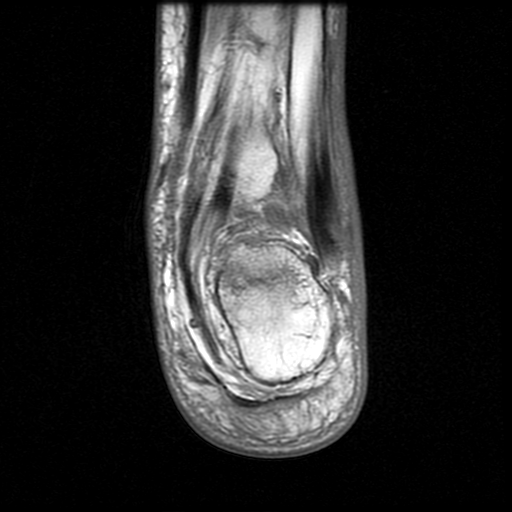
[im 31/36]
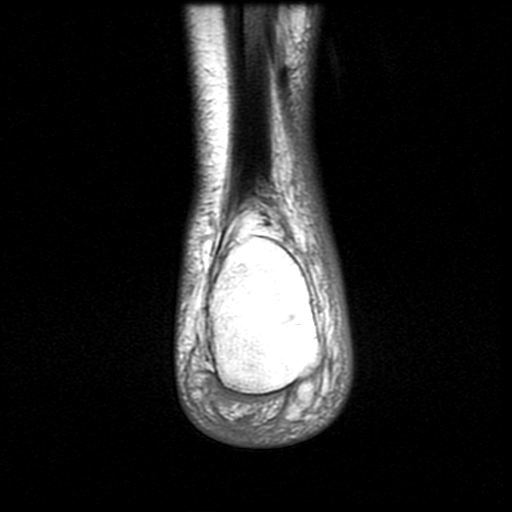
[im 36/36]
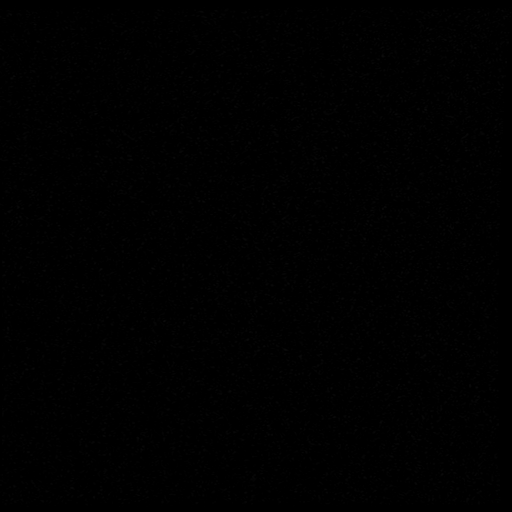

[Series 4: **ax t1fs if · coronal · 4.0mm · 0.29mm/px · 3 of 36 slices shown]
[im 5/36]
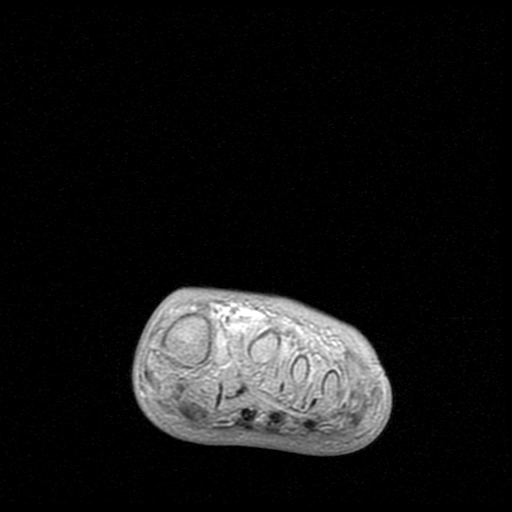
[im 18/36]
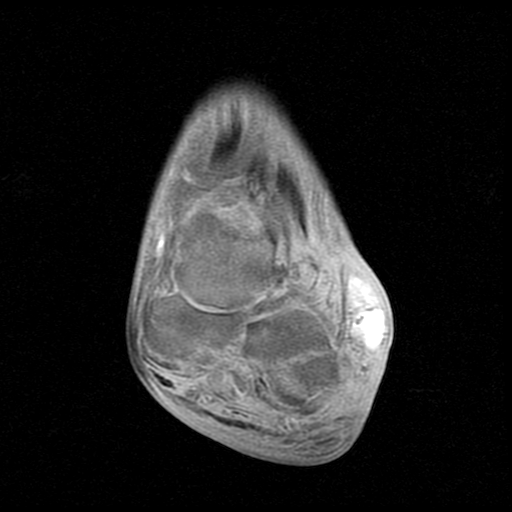
[im 31/36]
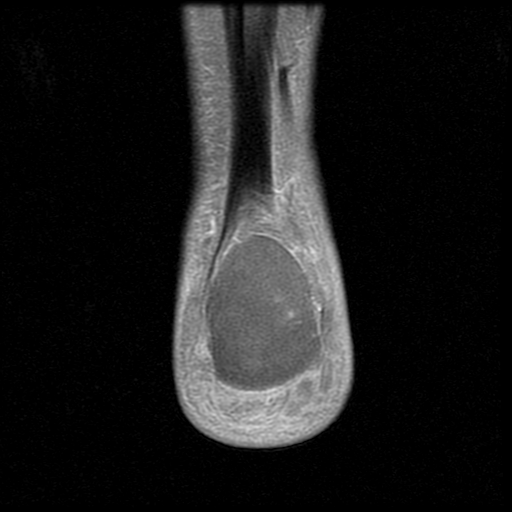

[Series 5: T2 · coronal · 4.0mm · 0.29mm/px · 3 of 36 slices shown]
[im 6/36]
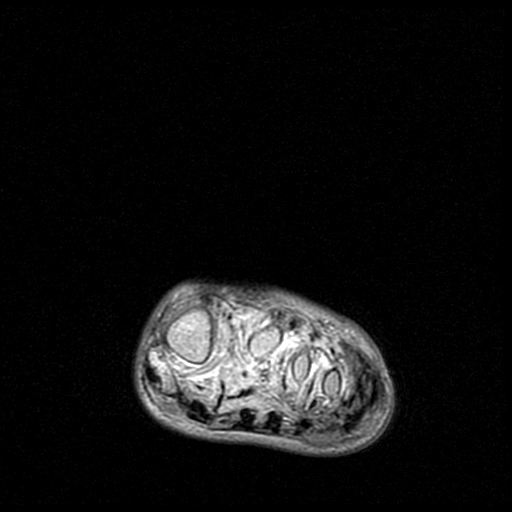
[im 21/36]
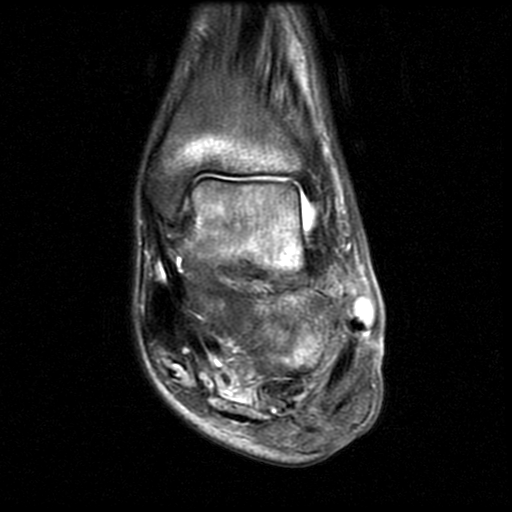
[im 31/36]
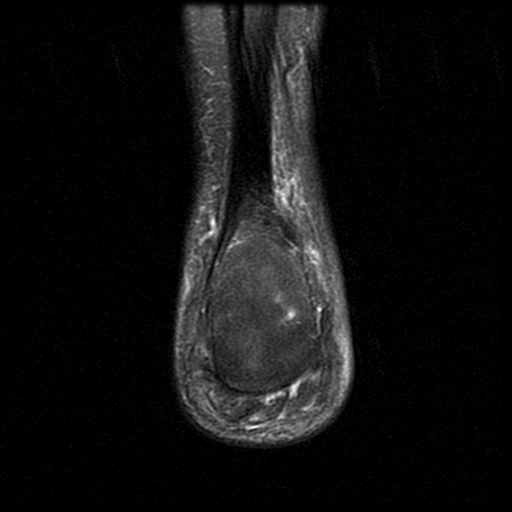

[Series 6: T1 · axial · 4.0mm · 0.33mm/px · z∈[-82,+19]mm · 4 of 22 slices shown (2 of 2)]
[im 1/22]
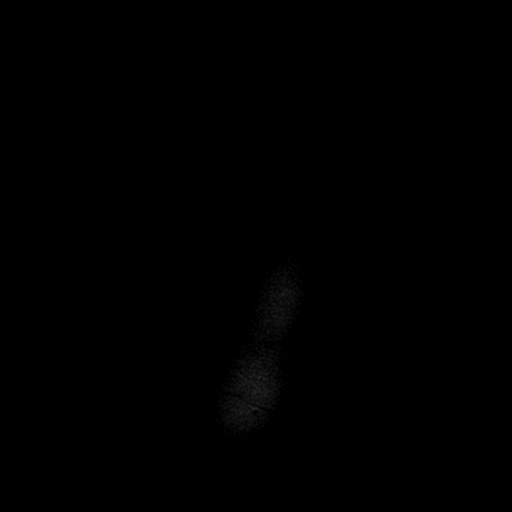
[im 6/22]
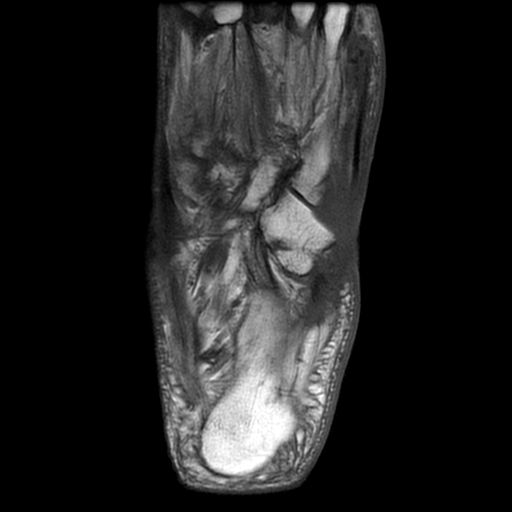
[im 11/22]
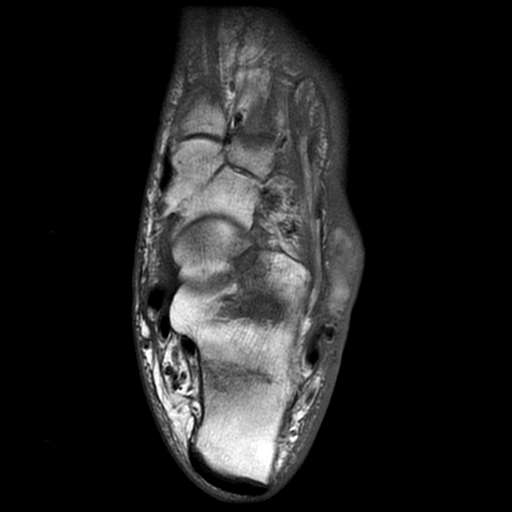
[im 22/22]
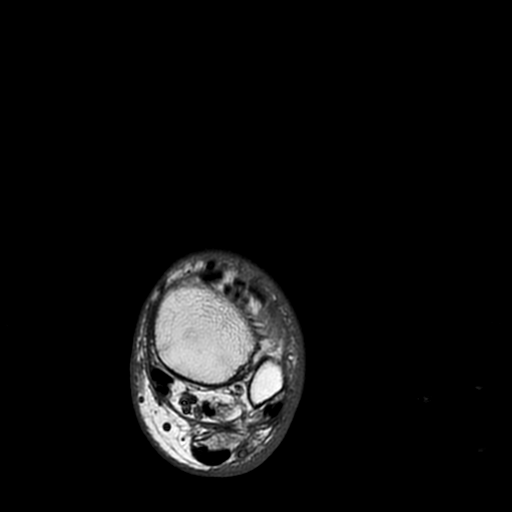

[19 of 40 positions shown; findings below may reference images not displayed]

FINDINGS: A fluid collection with septations and mixed signal intensity
measuring 2.8 cm long by 1.2 cm transverse by 2.1 cm craniocaudal is
seen at the level the calcaneocuboid joint. No other fluid
collection is seen. Tendons are intact. Intrinsic musculature the
foot is atrophied.

Abnormal signal is seen in the distal tibia at the plafond. All
imaged bones of the foot demonstrate areas of increased T2 signal.
Although there is abnormal signal in the cuboid and fifth
metatarsal, these bones are relatively spared. No fracture is
identified. No evidence of arthropathy is seen.
IMPRESSION: Subcutaneous fluid collection at the level the calcaneocuboid joint
could be due to abscess or hematoma. It should be easily amenable to
sampling.

Marrow edema in all visualized bones the foot pain and the distal
tibia is new since the prior MRI and nonspecific. Signal abnormality
could be due to multifocal osteomyelitis or stress change.

## 2017-04-03 ENCOUNTER — Telehealth: Payer: Self-pay

## 2017-04-03 NOTE — Telephone Encounter (Signed)
Left vm requesting patient call back to discuss note below

## 2017-04-03 NOTE — Telephone Encounter (Signed)
Patient having extreme diabetic nerve pain and she states it is to the point she can not walk at times- patient wants to know if this is treated by Dr. Dwyane Dee since he treats her diabetes- please call patient with his advice and she is having a problem with her phone so she may not be able to answer it but she will call right back

## 2017-04-03 NOTE — Telephone Encounter (Signed)
She is taking gabapentin 300 mg twice a day, she can increase this to up to 2 capsules 4 times a day as needed

## 2017-04-03 NOTE — Telephone Encounter (Signed)
Please advise 

## 2017-04-03 NOTE — Telephone Encounter (Signed)
Patient has still not received her Freestyle Elenor Legato can you please find out what has happened with this she ordered this in August through [facility] she also came in to the office and spoke to Rocky about this and was told paperwork was sent- please call this patient with the status of this

## 2017-04-08 ENCOUNTER — Ambulatory Visit (INDEPENDENT_AMBULATORY_CARE_PROVIDER_SITE_OTHER): Payer: Medicare Other | Admitting: Endocrinology

## 2017-04-08 ENCOUNTER — Encounter: Payer: Self-pay | Admitting: Endocrinology

## 2017-04-08 VITALS — BP 124/80 | HR 95 | Ht 67.0 in | Wt 121.0 lb

## 2017-04-08 DIAGNOSIS — E1065 Type 1 diabetes mellitus with hyperglycemia: Secondary | ICD-10-CM

## 2017-04-08 LAB — POCT GLYCOSYLATED HEMOGLOBIN (HGB A1C): Hemoglobin A1C: 10.6

## 2017-04-08 MED ORDER — PEN NEEDLES 32G X 4 MM MISC: 1.0000 | Freq: Two times a day (BID) | 1 refills | Status: DC

## 2017-04-08 MED ORDER — INSULIN GLARGINE 100 UNIT/ML SOLOSTAR PEN: PEN_INJECTOR | SUBCUTANEOUS | 1 refills | Status: DC

## 2017-04-08 NOTE — Patient Instructions (Addendum)
Lantus 12 in and 8 at nite  Humalog as before

## 2017-04-08 NOTE — Progress Notes (Signed)
Patient ID: [name], female   DOB: 1970/11/19, 47 y.o.   MRN: [id number]   Reason for Appointment: follow-up of diabetes  History of Present Illness   Diagnosis: Type 1 DIABETES MELITUS  Previous history: She has had long-standing poorly controlled diabetes and typically has poor compliance with self care measures despite periodic diabetes education and periodic followup  INSULIN PUMP regimen:    Basal rate midnight = 0.55, 3 AM = 0.4, 4 PM = 0.55 and 9 PM = 0.65   Carb coverage 1: 15 g and correction 1: 60 with target 120, active insulin 6 hours         Recent history:   Her last A1c was 10.1 and is now 10.6  She is checking her blood sugars up to 4 times a day,  limited by painful finger sticks  Current blood sugar patterns, difficulties with management and problems identified:  Blood sugars are poorly controlled especially since her pump stopped functioning about 7 days ago  With her not having a functional insulin pump she has markedly increased blood sugars throughout the day now and she did not ask for a prescription for Lantus  Currently using Humalog only as needed for high sugars and carbohydrate coverage.  She appears to be able to take correction doses fairly accurately based on 1:50 ratio, however has persistently high readings throughout the day now  Even prior to her pump malfunctioning her blood sugars had been extremely variable at all times although relatively lower on waking up late morning and midday and occasionally better later at night also  She thinks that more recently she is trying to watch her diet and eat small meals and avoid any sweets and drinks with sugar cause of her high sugars  She is not able to get the freestyle [place], not clear why she is having difficulties with the supplier   GLUCOSE readings from Contour monitor download:  AVERAGE for the last month 382, range 79-500+ with no specific pattern   Meals: eating at  variable times and also having snacks periodically.  Her daily carbohydrate intake is quite variable Physical activity: exercise: none         Dietician visit: Most recent:?          Complications: are: Neuropathy, nephropathy, diabetic foot ulcer      Wt Readings from Last 3 Encounters:  04/08/17 121 lb (54.9 kg)  03/21/17 129 lb (58.5 kg)  03/14/17 130 lb (59 kg)   Lab Results  Component Value Date   HGBA1C 10.6 04/08/2017   HGBA1C 10.1 (H) 01/11/2017   HGBA1C 11.6 (H) 10/05/2016   Lab Results  Component Value Date   MICROALBUR 139.6 (H) 10/05/2016   LDLCALC 78 05/02/2016   CREATININE 2.29 (H) 03/15/2017    Other problems addressed:: See review of systems   Allergies as of 04/08/2017      Reactions   Cleocin [clindamycin Hcl] Diarrhea   Diarrhea    Lisinopril Other (See Comments)   Elevated potassium per pt report   Amoxicillin Diarrhea, Nausea Only   Bactrim [sulfamethoxazole-trimethoprim] Diarrhea, Nausea Only   Ciprofloxacin Itching      Medication List        Accurate as of 04/08/17  8:46 PM. Always use your most recent med list.          albuterol 108 (90 Base) MCG/ACT inhaler Commonly known as:  PROVENTIL HFA;VENTOLIN HFA Inhale 2 puffs into the lungs every 6 (six) hours  as needed for wheezing or shortness of breath. Reported on 04/15/2015   amLODipine 2.5 MG tablet Commonly known as:  NORVASC Take 1 tablet (2.5 mg total) by mouth daily.   aspirin 325 MG tablet Take 325 mg by mouth daily.   calcitRIOL 0.5 MCG capsule Commonly known as:  ROCALTROL Take 1 capsule by mouth daily.   CALCIUM GUMMIES 250-100-500 MG-MG-UNIT Chew Generic drug:  Calcium-Phosphorus-Vitamin D Chew 2 each by mouth daily.   CINNAMON PO Take 1 tablet by mouth daily.   folic acid 1 MG tablet Commonly known as:  FOLVITE Take 1 mg by mouth daily.   FREESTYLE LIBRE READER Devi 1 Device by Does not apply route as directed.   FREESTYLE LIBRE SENSOR SYSTEM Misc Use 1 sensor  every 10 days   furosemide 40 MG tablet Commonly known as:  LASIX Take 40 mg by mouth daily as needed for fluid.   gabapentin 300 MG capsule Commonly known as:  NEURONTIN Take 1 capsule (300 mg total) by mouth 2 (two) times daily.   glucose blood test strip Commonly known as:  CONTOUR NEXT TEST USE AS INSTRUCTED TO CHECK BLOOD SUGAR 4 TIMES DAILY Dx Code E10.65   HUMALOG 100 UNIT/ML injection Generic drug:  insulin lispro CALL MD IF <70, IF 151-200 =2UNITS,201-250 =4 UNITS,251-300 =6 UNITS, 301-350 =8 UNITS,351-400 =10 U   Insulin Glargine 100 UNIT/ML Solostar Pen Commonly known as:  LANTUS SOLOSTAR 12 units in the morning and 8 units at night   insulin pump Soln Inject into the skin. 15 carbs = 1 unit, every 50 sugars = 1 unit   norethindrone 5 MG tablet Commonly known as:  AYGESTIN Take 1 tablet by mouth See admin instructions. Take 1 tablet daily for 14 days and off 14 days.Philbert Riser BONE Caps Take 1 each by mouth daily.   ondansetron 4 MG disintegrating tablet Commonly known as:  ZOFRAN-ODT DISSOLVE 1 TABLET ON TONGUE EVERY 8 HOURS AS NEEDED FOR NAUSEA   Pen Needles 32G X 4 MM Misc 1 Device by Does not apply route 2 (two) times daily.   pravastatin 40 MG tablet Commonly known as:  PRAVACHOL Take 1 tablet (40 mg total) by mouth daily.   Probiotic Caps Take 1 capsule by mouth daily.   pyridoxine 100 MG tablet Commonly known as:  B-6 Take 100 mg by mouth daily. Reported on 04/15/2015   traMADol 50 MG tablet Commonly known as:  ULTRAM Take 1 tablet (50 mg total) by mouth every 6 (six) hours as needed.   VISION PLUS Caps Take 1 each by mouth daily.   VITAMIN B12 PO Take 1 each by mouth daily.   vitamin C 500 MG tablet Commonly known as:  ASCORBIC ACID Take 500 mg by mouth daily.       Allergies:  Allergies  Allergen Reactions  . Cleocin [Clindamycin Hcl] Diarrhea    Diarrhea   . Lisinopril Other (See Comments)    Elevated potassium per pt  report  . Amoxicillin Diarrhea and Nausea Only  . Bactrim [Sulfamethoxazole-Trimethoprim] Diarrhea and Nausea Only  . Ciprofloxacin Itching    Past Medical History:  Diagnosis Date  . Anemia   . C. difficile diarrhea 09/26/2014  . Cataracts, both eyes   . Cellulitis of right foot 06/02/2014  . CKD (chronic kidney disease) stage 3, GFR 30-59 ml/min (HCC)    sees Dr Justin Mend- Stage IV  . Diabetic ulcer of right foot ([place]) 06/02/2014  . DVT (deep venous  thrombosis) ([place]) 10/2014   one in each one in leg- PICC line   . H/O seasonal allergies   . Heart murmur    told once when she was pregnant- no furter mention- was in notes 11/2015- had Echo 8 /4/17  . History of blood transfusion   . Hypercholesteremia   . Hypertension   . Left foot infection   . Neuropathy    feet  . Neuropathy in diabetes ([place])   . Peripheral vascular disease ([place])   . Staphylococcus aureus bacteremia 10/2014  . Type 1 diabetes ([place])    onset age 65    Past Surgical History:  Procedure Laterality Date  . AMPUTATION TOE Left 07/24/2015   5th toe and Hallux amputation  . CESAREAN SECTION    . EXOSTECTECTOMY TOE Left 04/11/2016   Procedure: EXOSTECTECTOMY CUBOID AND 5TH METATARSAL AND PLACEMENT OF ANTIBIOTIC BEADS;  Surgeon: Edrick Kins, DPM;  Location: [facility];  Service: Podiatry;  Laterality: Left;  . EYE SURGERY Bilateral    [place]  . FEMORAL-POPLITEAL BYPASS GRAFT Left 05/03/2015  . FOOT AMPUTATION THROUGH METATARSAL Right 2016  . hemrrhoidectomy    . I&D EXTREMITY Right 06/10/2014   Procedure: IRRIGATION AND DEBRIDEMENT Right Foot;  Surgeon: Newt Minion, MD;  Location: [facility];  Service: Orthopedics;  Laterality: Right;  . INCISION AND DRAINAGE Left 04/11/2016   Procedure: INCISION AND DRAINAGE A DEEP COMPLICATED WOUND OF LEFT FOOT;  Surgeon: Edrick Kins, DPM;  Location: [facility];  Service: Podiatry;  Laterality: Left;  . PERIPHERALLY INSERTED CENTRAL CATHETER INSERTION    . SKIN GRAFT Right 06/15/2014  . SKIN  GRAFT Left 06/2015   foot   . SKIN SPLIT GRAFT Right 06/15/2014   Procedure: SPLIT THICKNESS SKIN GRAFT RIGHT FOOT;  Surgeon: Newt Minion, MD;  Location: [facility];  Service: Orthopedics;  Laterality: Right;  . TRANSMETATARSAL AMPUTATION Right   . TRANSMETATARSAL AMPUTATION Right 09/11/2014    Family History  Problem Relation Age of Onset  . Hyperlipidemia Mother   . Dementia Mother     Social History:  reports that she quit smoking about 8 years ago. She quit after 15.00 years of use. she has never used smokeless tobacco. She reports that she drinks alcohol. She reports that she does not use drugs.  Review of Systems:   Hypertension/CKD:    Hypertension  Treated with amlodipine 2.5 mg  BP Readings from Last 3 Encounters:  04/08/17 124/80  03/21/17 (!) 158/80  03/14/17 (!) 164/88     Calcium is low along with her albumin, again does not take calcitriol regularly  Lab Results  Component Value Date   CALCIUM 6.9 (L) 03/15/2017   PHOS 3.5 08/18/2014     Lab Results  Component Value Date   CREATININE 2.29 (H) 03/15/2017   BUN 40 (H) 03/15/2017   NA 135 03/15/2017   K 4.9 03/15/2017   CL 106 03/15/2017   CO2 19 03/15/2017     Lipids: Has been  prescribed pravastatin Triglycerides are high LDL controlled    Lab Results  Component Value Date   CHOL 164 01/11/2017   HDL 25.50 (L) 01/11/2017   LDLCALC 78 05/02/2016   LDLDIRECT 93.0 01/11/2017   TRIG 282.0 (H) 01/11/2017   CHOLHDL 6 01/11/2017      Chronic anemia:  Lab Results  Component Value Date   WBC 7.1 02/28/2017   HGB 11.0 (L) 02/28/2017   HCT 34.8 (L) 02/28/2017   MCV 84.1 02/28/2017  PLT 297 02/28/2017    She has a Charcot foot And has had amputation of Left fifth toe which was infected She is going to the podiatrist with  continued nonhealing ulcers Also followed by infectious disease for osteoarthritis but she does not think she has had antibiotics recently  She had called a few days ago  about sharp pains in her feet and legs and was told to increase Neurontin However she said that she is using a cream-containing possibly lidocaine previously prescribed for hemorrhoids with relief  She is asking about intermittent diarrhea for the last 2 weeks    Examination:   BP 124/80   Pulse 95   Ht 5\' 7"  (1.702 m)   Wt 121 lb (54.9 kg)   SpO2 99%   BMI 18.95 kg/m   Body mass index is 18.95 kg/m.  Exam not indicated   ASSESSMENT/ PLAN:   Diabetes type 1 with poor control and multiple complications    Her diabetes is persistently poorly controlled mostly from noncompliance with all measures of self-care  A1c is over 10% again  See history of present illness for details of her blood sugar patterns, current management and problems identified  Her blood sugars are poorly controlled with significantly high readings at all times even when she was using insulin pump Recently because of taking the difficulty she is not able to use the pump and not clear when she will be to get a replacement  Blood sugars in the last few days are consistently high throughout the day but only once or twice below 300 with her taking only Humalog as needed   DIABETIC nephropathy with nephrotic syndrome: followed by nephrologist Creatinine clearance currently 25    PLAN:    Lantus 12 units in the morning and 8 in the evening  Given her a flowsheet to titrate her evening Lantus to get morning sugars down to at least 150 or below  She will also increase her morning Lantus at suppertime readings are consistently high  Continue taking Humalog using the pump for calculation based on carbohydrate intake and blood sugar readings  She needs to follow-up with PCP if she has consistent diarrhea and evaluation for C. difficile  Meanwhile she can take Imodium as needed    Counseling time on subjects discussed in assessment and plan sections is over 50% of today's 25 minute visit  Patient  Instructions  Lantus 12 in and 8 at nite  Humalog as before     Elayne Snare 04/08/2017, 8:46 PM

## 2017-04-19 ENCOUNTER — Telehealth: Payer: Self-pay | Admitting: Endocrinology

## 2017-04-19 NOTE — Telephone Encounter (Signed)
Patient has some questions about her pump, her warranty is up and need a new pump. Please advise

## 2017-04-22 NOTE — Telephone Encounter (Signed)
Patient stated she has her new pump and do not know how to use her settings, please advise

## 2017-04-23 NOTE — Telephone Encounter (Signed)
Pt is stating she has new pump and needs help with her settings.   Please advise [phone]

## 2017-04-23 NOTE — Telephone Encounter (Signed)
Appt. Scheduled for Monday next week

## 2017-04-29 ENCOUNTER — Encounter: Payer: Medicare Other | Admitting: Nutrition

## 2017-05-02 ENCOUNTER — Ambulatory Visit (INDEPENDENT_AMBULATORY_CARE_PROVIDER_SITE_OTHER): Payer: Medicare Other | Admitting: Internal Medicine

## 2017-05-02 DIAGNOSIS — M86671 Other chronic osteomyelitis, right ankle and foot: Secondary | ICD-10-CM

## 2017-05-02 NOTE — Assessment & Plan Note (Signed)
The right foot osteomyelitis has been cured.  She will need to continue local wound care for her plantar ulcers.  I believe the redness of her left leg is due to venous insufficiency rather than cellulitis.  I asked her to get a new thermometer and take her temperature because of her recent chills.  I will see her back in 1 week.

## 2017-05-02 NOTE — Progress Notes (Signed)
[facility] for Infectious Disease  Patient Active Problem List   Diagnosis Date Noted  . Chronic osteomyelitis of right foot ([place]) 01/16/2017    Priority: High  . Status post transmetatarsal amputation of right foot ([place]) 09/26/2014    Priority: High  . Foot ulcer due to secondary DM ([place]) 05/09/2011    Priority: High  . Diabetes mellitus type 1 with peripheral artery disease ([place]) 01/16/2017  . Heart murmur 11/03/2015  . Protein-calorie malnutrition, moderate ([place]) 10/18/2014  . History of DVT (deep vein thrombosis)   . Chronic anemia 10/13/2014  . DM type 1 causing renal disease ([place]) 06/13/2013  . CKD (chronic kidney disease), stage III ([place]) 06/12/2013  . Charcot foot due to diabetes mellitus ([place]) 05/09/2011  . Type 1 diabetes mellitus with neurological manifestations, uncontrolled ([place])   . Hypertension   . Hypercholesteremia     Patient's Medications  New Prescriptions   No medications on file  Previous Medications   ALBUTEROL (PROVENTIL HFA;VENTOLIN HFA) 108 (90 BASE) MCG/ACT INHALER    Inhale 2 puffs into the lungs every 6 (six) hours as needed for wheezing or shortness of breath. Reported on 04/15/2015   AMLODIPINE (NORVASC) 2.5 MG TABLET    Take 1 tablet (2.5 mg total) by mouth daily.   ASPIRIN 325 MG TABLET    Take 325 mg by mouth daily.   CALCITRIOL (ROCALTROL) 0.5 MCG CAPSULE    Take 1 capsule by mouth daily.   CALCIUM-PHOSPHORUS-VITAMIN D (CALCIUM GUMMIES) 998-338-250 MG-MG-UNIT CHEW    Chew 2 each by mouth daily.   CINNAMON PO    Take 1 tablet by mouth daily.   CONTINUOUS BLOOD GLUC RECEIVER (FREESTYLE LIBRE READER) DEVI    1 Device by Does not apply route as directed.   CONTINUOUS BLOOD GLUC SENSOR (FREESTYLE LIBRE SENSOR SYSTEM) MISC    Use 1 sensor every 10 days   CYANOCOBALAMIN (VITAMIN B12 PO)    Take 1 each by mouth daily.   FOLIC ACID (FOLVITE) 1 MG TABLET    Take 1 mg by mouth daily.   FUROSEMIDE (LASIX) 40 MG TABLET    Take 40 mg by  mouth daily as needed for fluid.    GABAPENTIN (NEURONTIN) 300 MG CAPSULE    Take 1 capsule (300 mg total) by mouth 2 (two) times daily.   GLUCOSE BLOOD (CONTOUR NEXT TEST) TEST STRIP    USE AS INSTRUCTED TO CHECK BLOOD SUGAR 4 TIMES DAILY Dx Code E10.65   HUMALOG 100 UNIT/ML INJECTION    CALL MD IF <70, IF 151-200 =2UNITS,201-250 =4 UNITS,251-300 =6 UNITS, 301-350 =8 UNITS,351-400 =10 U   INSULIN GLARGINE (LANTUS SOLOSTAR) 100 UNIT/ML SOLOSTAR PEN    12 units in the morning and 8 units at night   INSULIN HUMAN (INSULIN PUMP) SOLN    Inject into the skin. 15 carbs = 1 unit, every 50 sugars = 1 unit   INSULIN PEN NEEDLE (PEN NEEDLES) 32G X 4 MM MISC    1 Device by Does not apply route 2 (two) times daily.   MULTIPLE MINERALS-VITAMINS (NUTRA-SUPPORT BONE) CAPS    Take 1 each by mouth daily.   MULTIPLE VITAMINS-MINERALS (VISION PLUS) CAPS    Take 1 each by mouth daily.   NORETHINDRONE (AYGESTIN) 5 MG TABLET    Take 1 tablet by mouth See admin instructions. Take 1 tablet daily for 14 days and off 14 days.Marland Kitchen   ONDANSETRON (ZOFRAN-ODT) 4 MG DISINTEGRATING TABLET  DISSOLVE 1 TABLET ON TONGUE EVERY 8 HOURS AS NEEDED FOR NAUSEA   PRAVASTATIN (PRAVACHOL) 40 MG TABLET    Take 1 tablet (40 mg total) by mouth daily.   PROBIOTIC CAPS    Take 1 capsule by mouth daily.   PYRIDOXINE (B-6) 100 MG TABLET    Take 100 mg by mouth daily. Reported on 04/15/2015   TRAMADOL (ULTRAM) 50 MG TABLET    Take 1 tablet (50 mg total) by mouth every 6 (six) hours as needed.   VITAMIN C (ASCORBIC ACID) 500 MG TABLET    Take 500 mg by mouth daily.  Modified Medications   No medications on file  Discontinued Medications   No medications on file    Subjective: Jonet is in for her routine follow-up visit. She completed 6 weeks of antibiotic therapy with ciprofloxacin, metronidazole and linezolid on 02/28/2017 for her right foot osteomyelitis. She noted some swelling and redness of her left leg from her ankle to her knee recently.     Her home nurse continues to come out regularly to inspect the wound on the plantar surface of her right foot. She has been told that it is improving. She continues to use medihoney and a CBD oil on her wound.    Review of Systems: Review of Systems  Constitutional: Positive for chills. Negative for diaphoresis and fever.  Eyes: Negative for blurred vision.  Gastrointestinal: Negative for abdominal pain, diarrhea, nausea and vomiting.  Musculoskeletal: Negative for joint pain.    Past Medical History:  Diagnosis Date  . Anemia   . C. difficile diarrhea 09/26/2014  . Cataracts, both eyes   . Cellulitis of right foot 06/02/2014  . CKD (chronic kidney disease) stage 3, GFR 30-59 ml/min (HCC)    sees Dr Justin Mend- Stage IV  . Diabetic ulcer of right foot ([place]) 06/02/2014  . DVT (deep venous thrombosis) ([place]) 10/2014   one in each one in leg- PICC line   . H/O seasonal allergies   . Heart murmur    told once when she was pregnant- no furter mention- was in notes 11/2015- had Echo 8 /4/17  . History of blood transfusion   . Hypercholesteremia   . Hypertension   . Left foot infection   . Neuropathy    feet  . Neuropathy in diabetes ([place])   . Peripheral vascular disease ([place])   . Staphylococcus aureus bacteremia 10/2014  . Type 1 diabetes [facility])    onset age 89    Social History   Tobacco Use  . Smoking status: Former Smoker    Years: 15.00    Last attempt to quit: 05/16/2008    Years since quitting: 8.9  . Smokeless tobacco: Never Used  Substance Use Topics  . Alcohol use: Yes    Comment: Occasional  . Drug use: No    Family History  Problem Relation Age of Onset  . Hyperlipidemia Mother   . Dementia Mother     Allergies  Allergen Reactions  . Cleocin [Clindamycin Hcl] Diarrhea    Diarrhea   . Lisinopril Other (See Comments)    Elevated potassium per pt report  . Amoxicillin Diarrhea and Nausea Only  . Bactrim [Sulfamethoxazole-Trimethoprim] Diarrhea and Nausea Only  .  Ciprofloxacin Itching    Objective: Vitals:   05/02/17 1422  BP: (!) 181/92  Pulse: (!) 105  Temp: 99.1 F (37.3 C)  TempSrc: Oral  Weight: 127 lb (57.6 kg)  Height: 5\' 7"  (1.702 m)   Body  mass index is 19.89 kg/m.  Physical Exam  Constitutional: She is oriented to person, place, and time.  She is in good spirits.   Musculoskeletal:  The ulcer on the plantar surface of her right foot under her first metatarsal head continues to heal. There is now only a small area of granulation tissue exposed that is about the size of a dime. There is no evidence of active cellulitis of her lower leg.  The surrounding callus is abraded by the pumice stone.  There is some blood on the gauze dressing.  There is no other drainage or odor.  There is a bland ulcer on the plantar surface of her left foot without evidence of active infection.  She has some swelling and redness above her left ankle.  There is a superficial scratch on the left side of her calf from where her boot rubbed recently.  Neurological: She is alert and oriented to person, place, and time.  Skin: No rash noted.  Psychiatric: Mood and affect normal.    Lab Results    Problem List Items Addressed This Visit      High   Chronic osteomyelitis of right foot ([place])    The right foot osteomyelitis has been cured.  She will need to continue local wound care for her plantar ulcers.  I believe the redness of her left leg is due to venous insufficiency rather than cellulitis.  I asked her to get a new thermometer and take her temperature because of her recent chills.  I will see her back in 1 week.          Michel Bickers, MD [facility] [phone] pager   [phone] cell 05/02/2017, 3:39 PM

## 2017-05-03 ENCOUNTER — Encounter: Payer: Self-pay | Admitting: Internal Medicine

## 2017-05-04 ENCOUNTER — Other Ambulatory Visit: Payer: Self-pay

## 2017-05-04 ENCOUNTER — Encounter: Payer: Self-pay | Admitting: Internal Medicine

## 2017-05-04 ENCOUNTER — Emergency Department (HOSPITAL_BASED_OUTPATIENT_CLINIC_OR_DEPARTMENT_OTHER): Payer: Medicare Other

## 2017-05-04 ENCOUNTER — Other Ambulatory Visit: Payer: Self-pay | Admitting: Endocrinology

## 2017-05-04 ENCOUNTER — Encounter: Payer: Self-pay | Admitting: Podiatry

## 2017-05-04 ENCOUNTER — Inpatient Hospital Stay (HOSPITAL_BASED_OUTPATIENT_CLINIC_OR_DEPARTMENT_OTHER)
Admission: EM | Admit: 2017-05-04 | Discharge: 2017-05-10 | DRG: 570 | Disposition: A | Discharge status: Home / Self Care | Payer: Medicare Other | Attending: Internal Medicine | Admitting: Internal Medicine

## 2017-05-04 ENCOUNTER — Encounter (HOSPITAL_BASED_OUTPATIENT_CLINIC_OR_DEPARTMENT_OTHER): Payer: Self-pay | Admitting: *Deleted

## 2017-05-04 DIAGNOSIS — Z681 Body mass index (BMI) 19 or less, adult: Secondary | ICD-10-CM

## 2017-05-04 DIAGNOSIS — E1029 Type 1 diabetes mellitus with other diabetic kidney complication: Secondary | ICD-10-CM | POA: Diagnosis present

## 2017-05-04 DIAGNOSIS — E1051 Type 1 diabetes mellitus with diabetic peripheral angiopathy without gangrene: Secondary | ICD-10-CM | POA: Diagnosis present

## 2017-05-04 DIAGNOSIS — E7800 Pure hypercholesterolemia, unspecified: Secondary | ICD-10-CM | POA: Diagnosis present

## 2017-05-04 DIAGNOSIS — E1021 Type 1 diabetes mellitus with diabetic nephropathy: Secondary | ICD-10-CM

## 2017-05-04 DIAGNOSIS — L97425 Non-pressure chronic ulcer of left heel and midfoot with muscle involvement without evidence of necrosis: Secondary | ICD-10-CM | POA: Diagnosis not present

## 2017-05-04 DIAGNOSIS — E11628 Type 2 diabetes mellitus with other skin complications: Secondary | ICD-10-CM | POA: Diagnosis present

## 2017-05-04 DIAGNOSIS — L02612 Cutaneous abscess of left foot: Secondary | ICD-10-CM | POA: Diagnosis present

## 2017-05-04 DIAGNOSIS — N184 Chronic kidney disease, stage 4 (severe): Secondary | ICD-10-CM | POA: Diagnosis present

## 2017-05-04 DIAGNOSIS — S90822D Blister (nonthermal), left foot, subsequent encounter: Secondary | ICD-10-CM | POA: Diagnosis not present

## 2017-05-04 DIAGNOSIS — E1036 Type 1 diabetes mellitus with diabetic cataract: Secondary | ICD-10-CM | POA: Diagnosis present

## 2017-05-04 DIAGNOSIS — E785 Hyperlipidemia, unspecified: Secondary | ICD-10-CM | POA: Diagnosis present

## 2017-05-04 DIAGNOSIS — E440 Moderate protein-calorie malnutrition: Secondary | ICD-10-CM | POA: Diagnosis present

## 2017-05-04 DIAGNOSIS — E860 Dehydration: Secondary | ICD-10-CM | POA: Diagnosis present

## 2017-05-04 DIAGNOSIS — Z89431 Acquired absence of right foot: Secondary | ICD-10-CM

## 2017-05-04 DIAGNOSIS — E11610 Type 2 diabetes mellitus with diabetic neuropathic arthropathy: Secondary | ICD-10-CM | POA: Diagnosis not present

## 2017-05-04 DIAGNOSIS — I10 Essential (primary) hypertension: Secondary | ICD-10-CM | POA: Diagnosis not present

## 2017-05-04 DIAGNOSIS — Z87891 Personal history of nicotine dependence: Secondary | ICD-10-CM

## 2017-05-04 DIAGNOSIS — L97529 Non-pressure chronic ulcer of other part of left foot with unspecified severity: Secondary | ICD-10-CM | POA: Diagnosis present

## 2017-05-04 DIAGNOSIS — Z7982 Long term (current) use of aspirin: Secondary | ICD-10-CM | POA: Diagnosis not present

## 2017-05-04 DIAGNOSIS — I96 Gangrene, not elsewhere classified: Secondary | ICD-10-CM | POA: Diagnosis present

## 2017-05-04 DIAGNOSIS — Z881 Allergy status to other antibiotic agents status: Secondary | ICD-10-CM | POA: Diagnosis not present

## 2017-05-04 DIAGNOSIS — J302 Other seasonal allergic rhinitis: Secondary | ICD-10-CM | POA: Diagnosis present

## 2017-05-04 DIAGNOSIS — B9689 Other specified bacterial agents as the cause of diseases classified elsewhere: Secondary | ICD-10-CM | POA: Diagnosis not present

## 2017-05-04 DIAGNOSIS — E1140 Type 2 diabetes mellitus with diabetic neuropathy, unspecified: Secondary | ICD-10-CM | POA: Diagnosis not present

## 2017-05-04 DIAGNOSIS — E10628 Type 1 diabetes mellitus with other skin complications: Secondary | ICD-10-CM | POA: Diagnosis present

## 2017-05-04 DIAGNOSIS — E10621 Type 1 diabetes mellitus with foot ulcer: Secondary | ICD-10-CM | POA: Diagnosis present

## 2017-05-04 DIAGNOSIS — L089 Local infection of the skin and subcutaneous tissue, unspecified: Secondary | ICD-10-CM | POA: Diagnosis present

## 2017-05-04 DIAGNOSIS — L03116 Cellulitis of left lower limb: Secondary | ICD-10-CM | POA: Diagnosis present

## 2017-05-04 DIAGNOSIS — N183 Chronic kidney disease, stage 3 unspecified: Secondary | ICD-10-CM | POA: Diagnosis present

## 2017-05-04 DIAGNOSIS — Z8614 Personal history of Methicillin resistant Staphylococcus aureus infection: Secondary | ICD-10-CM

## 2017-05-04 DIAGNOSIS — Z888 Allergy status to other drugs, medicaments and biological substances status: Secondary | ICD-10-CM

## 2017-05-04 DIAGNOSIS — M869 Osteomyelitis, unspecified: Secondary | ICD-10-CM | POA: Diagnosis present

## 2017-05-04 DIAGNOSIS — E1069 Type 1 diabetes mellitus with other specified complication: Secondary | ICD-10-CM | POA: Diagnosis present

## 2017-05-04 DIAGNOSIS — E10649 Type 1 diabetes mellitus with hypoglycemia without coma: Secondary | ICD-10-CM | POA: Diagnosis not present

## 2017-05-04 DIAGNOSIS — E11621 Type 2 diabetes mellitus with foot ulcer: Secondary | ICD-10-CM | POA: Diagnosis present

## 2017-05-04 DIAGNOSIS — Z794 Long term (current) use of insulin: Secondary | ICD-10-CM | POA: Diagnosis not present

## 2017-05-04 DIAGNOSIS — Z882 Allergy status to sulfonamides status: Secondary | ICD-10-CM | POA: Diagnosis not present

## 2017-05-04 DIAGNOSIS — E1052 Type 1 diabetes mellitus with diabetic peripheral angiopathy with gangrene: Secondary | ICD-10-CM | POA: Diagnosis not present

## 2017-05-04 DIAGNOSIS — Z79899 Other long term (current) drug therapy: Secondary | ICD-10-CM | POA: Diagnosis not present

## 2017-05-04 DIAGNOSIS — I129 Hypertensive chronic kidney disease with stage 1 through stage 4 chronic kidney disease, or unspecified chronic kidney disease: Secondary | ICD-10-CM | POA: Diagnosis present

## 2017-05-04 DIAGNOSIS — L0390 Cellulitis, unspecified: Secondary | ICD-10-CM | POA: Diagnosis present

## 2017-05-04 DIAGNOSIS — D649 Anemia, unspecified: Secondary | ICD-10-CM | POA: Diagnosis present

## 2017-05-04 DIAGNOSIS — X58XXXD Exposure to other specified factors, subsequent encounter: Secondary | ICD-10-CM | POA: Diagnosis not present

## 2017-05-04 DIAGNOSIS — L97509 Non-pressure chronic ulcer of other part of unspecified foot with unspecified severity: Secondary | ICD-10-CM

## 2017-05-04 DIAGNOSIS — A480 Gas gangrene: Secondary | ICD-10-CM | POA: Diagnosis present

## 2017-05-04 DIAGNOSIS — R7881 Bacteremia: Secondary | ICD-10-CM | POA: Diagnosis not present

## 2017-05-04 DIAGNOSIS — L97528 Non-pressure chronic ulcer of other part of left foot with other specified severity: Secondary | ICD-10-CM | POA: Diagnosis not present

## 2017-05-04 DIAGNOSIS — E875 Hyperkalemia: Secondary | ICD-10-CM | POA: Diagnosis present

## 2017-05-04 DIAGNOSIS — B951 Streptococcus, group B, as the cause of diseases classified elsewhere: Secondary | ICD-10-CM | POA: Diagnosis not present

## 2017-05-04 LAB — COMPREHENSIVE METABOLIC PANEL
ALBUMIN: 2.8 g/dL — AB (ref 3.5–5.0)
ALK PHOS: 300 U/L — AB (ref 38–126)
ALT: 41 U/L (ref 14–54)
ANION GAP: 13 (ref 5–15)
AST: 34 U/L (ref 15–41)
BUN: 49 mg/dL — ABNORMAL HIGH (ref 6–20)
CO2: 19 mmol/L — AB (ref 22–32)
Calcium: 8.3 mg/dL — ABNORMAL LOW (ref 8.9–10.3)
Chloride: 102 mmol/L (ref 101–111)
Creatinine, Ser: 2.66 mg/dL — ABNORMAL HIGH (ref 0.44–1.00)
GFR calc non Af Amer: 21 mL/min — ABNORMAL LOW (ref >60)
GFR, EST AFRICAN AMERICAN: 24 mL/min — AB (ref >60)
GLUCOSE: 243 mg/dL — AB (ref 65–99)
POTASSIUM: 5.5 mmol/L — AB (ref 3.5–5.1)
SODIUM: 134 mmol/L — AB (ref 135–145)
Total Bilirubin: 0.3 mg/dL (ref 0.3–1.2)
Total Protein: 7.8 g/dL (ref 6.5–8.1)

## 2017-05-04 LAB — CBC WITH DIFFERENTIAL/PLATELET
BASOS ABS: 0 10*3/uL (ref 0.0–0.1)
BASOS PCT: 0 %
Eosinophils Absolute: 0.2 10*3/uL (ref 0.0–0.7)
Eosinophils Relative: 1 %
HEMATOCRIT: 29.8 % — AB (ref 36.0–46.0)
HEMOGLOBIN: 9.8 g/dL — AB (ref 12.0–15.0)
LYMPHS PCT: 5 %
Lymphs Abs: 1 10*3/uL (ref 0.7–4.0)
MCH: 26.7 pg (ref 26.0–34.0)
MCHC: 32.9 g/dL (ref 30.0–36.0)
MCV: 81.2 fL (ref 78.0–100.0)
MONOS PCT: 5 %
Monocytes Absolute: 0.9 10*3/uL (ref 0.1–1.0)
NEUTROS ABS: 16.8 10*3/uL — AB (ref 1.7–7.7)
NEUTROS PCT: 89 %
Platelets: 539 10*3/uL — ABNORMAL HIGH (ref 150–400)
RBC: 3.67 MIL/uL — ABNORMAL LOW (ref 3.87–5.11)
RDW: 16.3 % — ABNORMAL HIGH (ref 11.5–15.5)
WBC: 18.9 10*3/uL — ABNORMAL HIGH (ref 4.0–10.5)

## 2017-05-04 LAB — GLUCOSE, CAPILLARY: Glucose-Capillary: 416 mg/dL — ABNORMAL HIGH (ref 65–99)

## 2017-05-04 LAB — I-STAT CG4 LACTIC ACID, ED: LACTIC ACID, VENOUS: 1.76 mmol/L (ref 0.5–1.9)

## 2017-05-04 MED ORDER — CEFEPIME HCL 1 G IJ SOLR
INTRAMUSCULAR | Status: AC
  Administered 2017-05-05: 01:00:00
  Filled 2017-05-04: qty 1

## 2017-05-04 MED ORDER — VANCOMYCIN HCL IN DEXTROSE 1-5 GM/200ML-% IV SOLN
1000.0000 mg | Freq: Once | INTRAVENOUS | Status: AC
  Administered 2017-05-04: 1000 mg via INTRAVENOUS
  Filled 2017-05-04: qty 200

## 2017-05-04 MED ORDER — ONDANSETRON HCL 4 MG/2ML IJ SOLN
4.0000 mg | Freq: Once | INTRAMUSCULAR | Status: AC
  Administered 2017-05-04: 4 mg via INTRAVENOUS
  Filled 2017-05-04: qty 2

## 2017-05-04 MED ORDER — ACETAMINOPHEN 325 MG PO TABS
650.0000 mg | ORAL_TABLET | Freq: Once | ORAL | Status: AC
  Administered 2017-05-04: 650 mg via ORAL
  Filled 2017-05-04: qty 2

## 2017-05-04 MED ORDER — SODIUM CHLORIDE 0.9 % IV BOLUS (SEPSIS)
1000.0000 mL | Freq: Once | INTRAVENOUS | Status: AC
  Administered 2017-05-04: 1000 mL via INTRAVENOUS

## 2017-05-04 MED ORDER — DEXTROSE 5 % IV SOLN
1.0000 g | Freq: Once | INTRAVENOUS | Status: AC
  Administered 2017-05-04: 1 g via INTRAVENOUS

## 2017-05-04 NOTE — ED Notes (Signed)
Alert, NAD, calm, interactive, resps e/u, speaking in clear complete sentences, no dyspnea noted, skin W&D, VSS, mentions hunger, (denies: pain, sob, nausea, dizziness or visual changes). Pending transport.

## 2017-05-04 NOTE — Progress Notes (Signed)
Pt arrived via [facility] from [facility] , happy & talkative, new left foot cellulitis ( purple blister) on posterior - lateral aspect of left foot where there is also a pre existing diabetic wound to the bottom of same foot  Rt foot has no toes with a diabetic ulcer on the plantar aspect.

## 2017-05-04 NOTE — ED Provider Notes (Signed)
[facility]PART[facility] Provider Note   CSN: [id number] Arrival date & time: 05/04/17  1625     History   Chief Complaint Chief Complaint  Patient presents with  . Wound Check    HPI [name] is a 47 y.o. female.  Patient is a 47 year old female who presents with left foot pain and fevers.  She has a history of Charcot joints.  She also has diabetes, hypertension, hyperlipidemia, chronic kidney disease and history of recently treated osteomyelitis in the right foot.  She completed treatment of Cipro, Flagyl and linezolid on December 29, 2016.  She also has had prior infections in her left foot.  She developed a small blister 3 days ago to her left foot and is been rapidly progressing.  She now has swelling to her lower leg with warmth and erythema.  She states she has a fever of 103 yesterday.  She is been having shaking chills.  She also had some associated nausea and vomiting.  She has a residual cough from a recent URI but states that that has improved.  She has no abdominal pain.  She has some intermittent diarrhea.  She started taking some Cipro that she had at home yesterday.  She is followed by Dr. Megan Salon with infectious disease.      Past Medical History:  Diagnosis Date  . Anemia   . C. difficile diarrhea 09/26/2014  . Cataracts, both eyes   . Cellulitis of right foot 06/02/2014  . CKD (chronic kidney disease) stage 3, GFR 30-59 ml/min (HCC)    sees Dr Justin Mend- Stage IV  . Diabetic ulcer of right foot ([place]) 06/02/2014  . DVT (deep venous thrombosis) ([place]) 10/2014   one in each one in leg- PICC line   . H/O seasonal allergies   . Heart murmur    told once when she was pregnant- no furter mention- was in notes 11/2015- had Echo 8 /4/17  . History of blood transfusion   . Hypercholesteremia   . Hypertension   . Left foot infection   . Neuropathy    feet  . Neuropathy in diabetes ([place])   . Peripheral vascular disease ([place])   . Staphylococcus  aureus bacteremia 10/2014  . Type 1 diabetes [facility])    onset age 68    Patient Active Problem List   Diagnosis Date Noted  . Chronic osteomyelitis of right foot ([place]) 01/16/2017  . Diabetes mellitus type 1 with peripheral artery disease ([place]) 01/16/2017  . Heart murmur 11/03/2015  . Protein-calorie malnutrition, moderate ([place]) 10/18/2014  . History of DVT (deep vein thrombosis)   . Chronic anemia 10/13/2014  . 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 DM type 1 causing renal disease ([place]) 06/13/2013  . CKD (chronic kidney disease), stage III ([place]) 06/12/2013  . Charcot foot due to diabetes mellitus ([place]) 05/09/2011  . Foot ulcer due to secondary DM ([place]) 05/09/2011  . Type 1 diabetes mellitus with neurological manifestations, uncontrolled ([place])   . Hypertension   . Hypercholesteremia     Past Surgical History:  Procedure Laterality Date  . AMPUTATION TOE Left 07/24/2015   5th toe and Hallux amputation  . CESAREAN SECTION    . EXOSTECTECTOMY TOE Left 04/11/2016   Procedure: EXOSTECTECTOMY CUBOID AND 5TH METATARSAL AND PLACEMENT OF ANTIBIOTIC BEADS;  Surgeon: Edrick Kins, DPM;  Location: [place];  Service: Podiatry;  Laterality: Left;  . EYE SURGERY Bilateral    [place]  .  FEMORAL-POPLITEAL BYPASS GRAFT Left 05/03/2015  . FOOT AMPUTATION THROUGH METATARSAL Right 2016  . hemrrhoidectomy    . I&D EXTREMITY Right 06/10/2014   Procedure: IRRIGATION AND DEBRIDEMENT Right Foot;  Surgeon: Newt Minion, MD;  Location: [place];  Service: Orthopedics;  Laterality: Right;  . INCISION AND DRAINAGE Left 04/11/2016   Procedure: INCISION AND DRAINAGE A DEEP COMPLICATED WOUND OF LEFT FOOT;  Surgeon: Edrick Kins, DPM;  Location: [place];  Service: Podiatry;  Laterality: Left;  . PERIPHERALLY INSERTED CENTRAL CATHETER INSERTION    . SKIN GRAFT Right 06/15/2014  . SKIN GRAFT Left 06/2015   foot   . SKIN SPLIT GRAFT Right 06/15/2014   Procedure: SPLIT THICKNESS SKIN GRAFT  RIGHT FOOT;  Surgeon: Newt Minion, MD;  Location: [place];  Service: Orthopedics;  Laterality: Right;  . TRANSMETATARSAL AMPUTATION Right   . TRANSMETATARSAL AMPUTATION Right 09/11/2014    OB History    Gravida Para Term Preterm AB Living   1             SAB TAB Ectopic Multiple Live Births                   Home Medications    Prior to Admission medications   Medication Sig Start Date End Date Taking? Authorizing Provider  albuterol (PROVENTIL HFA;VENTOLIN HFA) 108 (90 BASE) MCG/ACT inhaler Inhale 2 puffs into the lungs every 6 (six) hours as needed for wheezing or shortness of breath. Reported on 04/15/2015    [provider]  amLODipine (NORVASC) 2.5 MG tablet Take 1 tablet (2.5 mg total) by mouth daily. 01/15/17   Elayne Snare, MD  aspirin 325 MG tablet Take 325 mg by mouth daily.    [provider]  calcitRIOL (ROCALTROL) 0.5 MCG capsule Take 1 capsule by mouth daily. 03/13/16   [provider]  Calcium-Phosphorus-Vitamin D (CALCIUM GUMMIES) [phone] MG-MG-UNIT CHEW Chew 2 each by mouth daily.    [provider]  CINNAMON PO Take 1 tablet by mouth daily.    [provider]  Continuous Blood Gluc Receiver (FREESTYLE LIBRE READER) DEVI 1 Device by Does not apply route as directed. 02/28/17   Elayne Snare, MD  Continuous Blood Gluc Sensor (FREESTYLE LIBRE SENSOR SYSTEM) MISC Use 1 sensor every 10 days 02/28/17   Elayne Snare, MD  CONTOUR NEXT TEST test strip USE AS INSTRUCTED TO CHECK BLOOD SUGAR 4 TIMES A DAY. DX E10.65 05/04/17   Elayne Snare, MD  Cyanocobalamin (VITAMIN B12 PO) Take 1 each by mouth daily.    [provider]  folic acid (FOLVITE) 1 MG tablet Take 1 mg by mouth daily.    [provider]  furosemide (LASIX) 40 MG tablet Take 40 mg by mouth daily as needed for fluid.     [provider]  gabapentin (NEURONTIN) 300 MG capsule Take 1 capsule (300 mg total) by mouth 2 (two) times daily. 09/10/16   Edrick Kins, DPM  HUMALOG 100 UNIT/ML injection CALL MD IF <70, IF 151-200 =2UNITS,201-250 =4 UNITS,251-300 =6 UNITS, 301-350 =8 UNITS,351-400 =10 U 05/04/17   Elayne Snare, MD  Insulin Glargine (LANTUS SOLOSTAR) 100 UNIT/ML Solostar Pen 12 units in the morning and 8 units at night 04/08/17   Elayne Snare, MD  Insulin Human (INSULIN PUMP) SOLN Inject into the skin. 15 carbs = 1 unit, every 50 sugars = 1 unit    [provider]  Insulin Pen Needle (PEN NEEDLES) 32G X 4 MM  MISC 1 Device by Does not apply route 2 (two) times daily. 04/08/17   Elayne Snare, MD  Multiple Minerals-Vitamins (NUTRA-SUPPORT BONE) CAPS Take 1 each by mouth daily.    [provider]  Multiple Vitamins-Minerals (VISION PLUS) CAPS Take 1 each by mouth daily.    [provider]  norethindrone (AYGESTIN) 5 MG tablet Take 1 tablet by mouth See admin instructions. Take 1 tablet daily for 14 days and off 14 days..    [provider]  ondansetron (ZOFRAN-ODT) 4 MG disintegrating tablet DISSOLVE 1 TABLET ON TONGUE EVERY 8 HOURS AS NEEDED FOR NAUSEA 04/23/16   [provider]  pravastatin (PRAVACHOL) 40 MG tablet Take 1 tablet (40 mg total) by mouth daily. 01/02/17   Elayne Snare, MD  Probiotic CAPS Take 1 capsule by mouth daily. 09/29/16   Ward, Delice Bison, DO  pyridoxine (B-6) 100 MG tablet Take 100 mg by mouth daily. Reported on 04/15/2015    [provider]  traMADol (ULTRAM) 50 MG tablet Take 1 tablet (50 mg total) by mouth every 6 (six) hours as needed. Patient not taking: Reported on 05/02/2017 09/29/16   Ward, Delice Bison, DO  vitamin C (ASCORBIC ACID) 500 MG tablet Take 500 mg by mouth daily.    [provider]    Family History Family History  Problem Relation Age of Onset  . Hyperlipidemia Mother   . Dementia Mother     Social History Social History   Tobacco Use  . Smoking status: Former Smoker    Years: 15.00    Last attempt to quit: 05/16/2008    Years since quitting:  8.9  . Smokeless tobacco: Never Used  Substance Use Topics  . Alcohol use: Yes    Comment: Occasional  . Drug use: No     Allergies   Cleocin [clindamycin hcl]; Lisinopril; Amoxicillin; Bactrim [sulfamethoxazole-trimethoprim]; and Ciprofloxacin   Review of Systems Review of Systems  Constitutional: Positive for chills and fever. Negative for diaphoresis and fatigue.  HENT: Negative for congestion, rhinorrhea and sneezing.   Eyes: Negative.   Respiratory: Positive for cough. Negative for chest tightness and shortness of breath.   Cardiovascular: Positive for leg swelling. Negative for chest pain.  Gastrointestinal: Positive for nausea and vomiting. Negative for abdominal pain, blood in stool and diarrhea.  Genitourinary: Negative for difficulty urinating, flank pain, frequency and hematuria.  Musculoskeletal: Negative for arthralgias and back pain.  Skin: Positive for color change and wound. Negative for rash.  Neurological: Negative for dizziness, speech difficulty, weakness, numbness and headaches.     Physical Exam Updated Vital Signs BP 130/66 (BP Location: Left Arm)   Pulse 96   Temp 99.2 F (37.3 C) (Oral)   Resp 18   Ht 5\' 7"  (1.702 m)   Wt 57.6 kg (127 lb)   LMP 04/02/2017 (Approximate)   SpO2 100%   BMI 19.89 kg/m   Physical Exam  Constitutional: She is oriented to person, place, and time. She appears well-developed and well-nourished.  HENT:  Head: Normocephalic and atraumatic.  Eyes: Pupils are equal, round, and reactive to light.  Neck: Normal range of motion. Neck supple.  Cardiovascular: Normal rate, regular rhythm and normal heart sounds.  Pulmonary/Chest: Effort normal and breath sounds normal. No respiratory distress. She has no wheezes. She has no rales. She exhibits no tenderness.  Abdominal: Soft. Bowel sounds are normal. There is no tenderness. There is no rebound and no guarding.  Musculoskeletal: Normal range of motion. She exhibits edema.  Patient has a large 3 cm dark colored ulcer to the plantar surface of her left foot.  There is also a healing wound more to the central plantar area of her foot.  There is some mild swelling of the foot and swelling of the lower leg with warmth and erythema extending to the mid calf.  No drainage from the foot is noted.  Lymphadenopathy:    She has no cervical adenopathy.  Neurological: She is alert and oriented to person, place, and time.  Skin: Skin is warm and dry. No rash noted.  Psychiatric: She has a normal mood and affect.     ED Treatments / Results  Labs (all labs ordered are listed, but only abnormal results are displayed) Labs Reviewed  COMPREHENSIVE METABOLIC PANEL - Abnormal; Notable for the following components:      Result Value   Sodium 134 (*)    Potassium 5.5 (*)    CO2 19 (*)    Glucose, Bld 243 (*)    BUN 49 (*)    Creatinine, Ser 2.66 (*)    Calcium 8.3 (*)    Albumin 2.8 (*)    Alkaline Phosphatase 300 (*)    GFR calc non Af Amer 21 (*)    GFR calc Af Amer 24 (*)    All other components within normal limits  CBC WITH DIFFERENTIAL/PLATELET - Abnormal; Notable for the following components:   WBC 18.9 (*)    RBC 3.67 (*)    Hemoglobin 9.8 (*)    HCT 29.8 (*)    RDW 16.3 (*)    Platelets 539 (*)    Neutro Abs 16.8 (*)    All other components within normal limits  CULTURE, BLOOD (ROUTINE X 2)  CULTURE, BLOOD (ROUTINE X 2)  URINALYSIS, ROUTINE W REFLEX MICROSCOPIC  I-STAT CG4 LACTIC ACID, ED  I-STAT CG4 LACTIC ACID, ED    EKG  EKG Interpretation  Date/Time:  Saturday May 04 2017 18:22:20 EST Ventricular Rate:  90 PR Interval:    QRS Duration: 79 QT Interval:  352 QTC Calculation: 431 R Axis:   84 Text Interpretation:  Sinus rhythm Borderline short PR interval Baseline wander in lead(s) V5 since last tracing no significant change Confirmed by Malvin Johns [phone]) on 05/04/2017 6:47:04 PM       Radiology Dg Chest 2 View  Result Date:  05/04/2017 CLINICAL DATA:  Patient reports fever of 103.2 last night with nausea and vomiting. EXAM: CHEST  2 VIEW COMPARISON:  09/29/2016 FINDINGS: The heart size and mediastinal contours are within normal limits. Both lungs are clear. Chronic right fourth rib fracture. No acute osseous appearing abnormality. IMPRESSION: No active cardiopulmonary disease. Electronically Signed   By: Ashley Royalty M.D.   On: 05/04/2017 18:38   Dg Foot Complete Left  Result Date: 05/04/2017 CLINICAL DATA:  New blister EXAM: LEFT FOOT - COMPLETE 3+ VIEW COMPARISON:  Numerous priors dating back through 10/15/2014. FINDINGS: Diffuse osteopenia of the left foot with slight rocker bottom appearance of the midfoot on the lateral view. 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 Soft tissue swelling is seen along the lateral aspect of the forefoot with soft tissue emphysema at the base of the fourth toe. There is a soft tissue ulceration along the plantar lateral aspect of the midfoot. No loss of cortical margins to suggest osteomyelitis. Midfoot osteoarthritis is stable. Bony protuberance along the posteromedial aspect of the navicular is also stable.  Generalized soft tissue swelling about the malleoli and included distal leg. No acute fracture or suspicious osseous lesions. IMPRESSION: 1. Soft tissue ulceration along the plantar lateral aspect of the midfoot with soft tissue emphysema along the plantar lateral aspect of the forefoot and extending along the fourth digit. 2. No radiographic manifestation of osteomyelitis. 3. 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 4. 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 Electronically Signed   By: Ashley Royalty M.D.   On: 05/04/2017 18:49    Procedures Procedures (including critical care time)  Medications Ordered in ED Medications  vancomycin (VANCOCIN) IVPB 1000 mg/200 mL premix (not  administered)  ceFEPIme (MAXIPIME) 1 g in dextrose 5 % 50 mL IVPB (not administered)  sodium chloride 0.9 % bolus 1,000 mL (not administered)     Initial Impression / Assessment and Plan / ED Course  I have reviewed the triage vital signs and the nursing notes.  Pertinent labs & imaging results that were available during my care of the patient were reviewed by me and considered in my medical decision making (see chart for details).    Patient presents with worsening ulcer to the plantar surface of her left foot in association with cellulitis of the left leg.  She had a fever yesterday of 103.  Her white count is elevated to 19,000.  Her lactate is normal and she has no other suggestions of sepsis.  I spoke with Dr. Johnnye Sima with infectious disease who recommended antibiotics of cefepime and vancomycin.  I will consult the hospitalist for admission.  Her creatinine is elevated but only mildly up from her baseline values.  Her hemoglobin is similar to prior values.  Her K is mildly elevated and she was given IVFs.  I spoke with Dr. Maudie Mercury who has accepted her for transfer to [facility]  Final Clinical Impressions(s) / ED Diagnoses   Final diagnoses:  Cellulitis of left foot    ED Discharge Orders    None       Malvin Johns, MD 05/04/17 2011

## 2017-05-04 NOTE — ED Notes (Signed)
Pt speaking on phone. No changes. VSS. Given meal. IVF/meds infusing.

## 2017-05-04 NOTE — ED Notes (Signed)
Patient transported to X-ray 

## 2017-05-04 NOTE — H&P (Addendum)
PCP:  [facility] Endo: Dr Dwyane Dee ID: Dr Megan Salon Surgery: Dr Merril Abbe Vein MD: Dr Vella Kohler   Chief Complaint:  Diabetic foot ulcer  HPI: this is a 47 y/o with h/o DM and Diabetic foot ulcer and OM d/c antibiotics approx 1 month ago. Saw ID on Tuesday. Friday saw a blister on left foot above wound. Today blister is very large. She denies pain. There is now increased redness around calf and ankles. Yesterday Tmax 103.2. She nausea and vomiting. She reports chills.   She has B/L LE ulcers which are cared for by visiting home wound nurse. intrum home health. She is seen M/W/F. She has a h/o MRSA wound infection.  She has a recent h/o MRSA osteomyelitis. She was treated with linezoid, cipro and flagyl for 7 weeks. Antibiotics d/ced approx 6 weeks ago  History provided by patient who is alert and oriented  Review of Systems:  The patient denies anorexia, fever, chills, weight loss, blister left foot, vision loss, decreased hearing, hoarseness, chest pain, syncope, dyspnea on exertion, peripheral edema, balance deficits, hemoptysis, abdominal pain, melena,nausea, vomiting, hematochezia, severe indigestion/heartburn, hematuria, incontinence, genital sores, muscle weakness, suspicious skin lesions, transient blindness, difficulty walking, depression, unusual weight change, abnormal bleeding, enlarged lymph nodes, angioedema, and breast masses.  Past Medical History: Past Medical History:  Diagnosis Date  . Anemia   . C. difficile diarrhea 09/26/2014  . Cataracts, both eyes   . Cellulitis of right foot 06/02/2014  . CKD (chronic kidney disease) stage 3, GFR 30-59 ml/min (HCC)    sees Dr Justin Mend- Stage IV  . Diabetic ulcer of right foot ([place]) 06/02/2014  . DVT (deep venous thrombosis) ([place]) 10/2014   one in each one in leg- PICC line   . H/O seasonal allergies   . Heart murmur    told once when she was pregnant- no furter mention- was in notes 11/2015- had Echo 8 /4/17  . History of blood  transfusion   . Hypercholesteremia   . Hypertension   . Left foot infection   . Neuropathy    feet  . Neuropathy in diabetes ([place])   . Peripheral vascular disease ([place])   . Staphylococcus aureus bacteremia 10/2014  . Type 1 diabetes ([place])    onset age 44   Past Surgical History:  Procedure Laterality Date  . AMPUTATION TOE Left 07/24/2015   5th toe and Hallux amputation  . CESAREAN SECTION    . EXOSTECTECTOMY TOE Left 04/11/2016   Procedure: EXOSTECTECTOMY CUBOID AND 5TH METATARSAL AND PLACEMENT OF ANTIBIOTIC BEADS;  Surgeon: Edrick Kins, DPM;  Location: [facility];  Service: Podiatry;  Laterality: Left;  . EYE SURGERY Bilateral    [place]  . FEMORAL-POPLITEAL BYPASS GRAFT Left 05/03/2015  . FOOT AMPUTATION THROUGH METATARSAL Right 2016  . hemrrhoidectomy    . I&D EXTREMITY Right 06/10/2014   Procedure: IRRIGATION AND DEBRIDEMENT Right Foot;  Surgeon: Newt Minion, MD;  Location: [facility];  Service: Orthopedics;  Laterality: Right;  . INCISION AND DRAINAGE Left 04/11/2016   Procedure: INCISION AND DRAINAGE A DEEP COMPLICATED WOUND OF LEFT FOOT;  Surgeon: Edrick Kins, DPM;  Location: [facility];  Service: Podiatry;  Laterality: Left;  . PERIPHERALLY INSERTED CENTRAL CATHETER INSERTION    . SKIN GRAFT Right 06/15/2014  . SKIN GRAFT Left 06/2015   foot   . SKIN SPLIT GRAFT Right 06/15/2014   Procedure: SPLIT THICKNESS SKIN GRAFT RIGHT FOOT;  Surgeon: Newt Minion, MD;  Location: [facility];  Service: Orthopedics;  Laterality: Right;  . TRANSMETATARSAL AMPUTATION Right   . TRANSMETATARSAL AMPUTATION Right 09/11/2014    Medications: Prior to Admission medications   Medication Sig Start Date End Date Taking? Authorizing Provider  albuterol (PROVENTIL HFA;VENTOLIN HFA) 108 (90 BASE) MCG/ACT inhaler Inhale 2 puffs into the lungs every 6 (six) hours as needed for wheezing or shortness of breath. Reported on 04/15/2015    [provider]  amLODipine (NORVASC) 2.5 MG tablet Take 1 tablet (2.5 mg  total) by mouth daily. 01/15/17   Elayne Snare, MD  aspirin 325 MG tablet Take 325 mg by mouth daily.    [provider]  calcitRIOL (ROCALTROL) 0.5 MCG capsule Take 1 capsule by mouth daily. 03/13/16   [provider]  Calcium-Phosphorus-Vitamin D (CALCIUM GUMMIES) [phone] MG-MG-UNIT CHEW Chew 2 each by mouth daily.    [provider]  CINNAMON PO Take 1 tablet by mouth daily.    [provider]  Continuous Blood Gluc Receiver (FREESTYLE LIBRE READER) DEVI 1 Device by Does not apply route as directed. 02/28/17   Elayne Snare, MD  Continuous Blood Gluc Sensor (FREESTYLE LIBRE SENSOR SYSTEM) MISC Use 1 sensor every 10 days 02/28/17   Elayne Snare, MD  CONTOUR NEXT TEST test strip USE AS INSTRUCTED TO CHECK BLOOD SUGAR 4 TIMES A DAY. DX E10.65 05/04/17   Elayne Snare, MD  Cyanocobalamin (VITAMIN B12 PO) Take 1 each by mouth daily.    [provider]  folic acid (FOLVITE) 1 MG tablet Take 1 mg by mouth daily.    [provider]  furosemide (LASIX) 40 MG tablet Take 40 mg by mouth daily as needed for fluid.     [provider]  gabapentin (NEURONTIN) 300 MG capsule Take 1 capsule (300 mg total) by mouth 2 (two) times daily. 09/10/16   Edrick Kins, DPM  HUMALOG 100 UNIT/ML injection CALL MD IF <70, IF 151-200 =2UNITS,201-250 =4 UNITS,251-300 =6 UNITS, 301-350 =8 UNITS,351-400 =10 U 05/04/17   Elayne Snare, MD  Insulin Glargine (LANTUS SOLOSTAR) 100 UNIT/ML Solostar Pen 12 units in the morning and 8 units at night 04/08/17   Elayne Snare, MD  Insulin Human (INSULIN PUMP) SOLN Inject into the skin. 15 carbs = 1 unit, every 50 sugars = 1 unit    [provider]  Insulin Pen Needle (PEN NEEDLES) 32G X 4 MM MISC 1 Device by Does not apply route 2 (two) times daily. 04/08/17   Elayne Snare, MD  Multiple Minerals-Vitamins (NUTRA-SUPPORT BONE) CAPS Take 1 each by mouth daily.    [provider]  Multiple Vitamins-Minerals (VISION PLUS) CAPS  Take 1 each by mouth daily.    [provider]  norethindrone (AYGESTIN) 5 MG tablet Take 1 tablet by mouth See admin instructions. Take 1 tablet daily for 14 days and off 14 days..    [provider]  ondansetron (ZOFRAN-ODT) 4 MG disintegrating tablet DISSOLVE 1 TABLET ON TONGUE EVERY 8 HOURS AS NEEDED FOR NAUSEA 04/23/16   [provider]  pravastatin (PRAVACHOL) 40 MG tablet Take 1 tablet (40 mg total) by mouth daily. 01/02/17   Elayne Snare, MD  Probiotic CAPS Take 1 capsule by mouth daily. 09/29/16   Ward, Delice Bison, DO  pyridoxine (B-6) 100 MG tablet Take 100 mg by mouth daily. Reported on 04/15/2015    [provider]  traMADol (ULTRAM) 50 MG tablet Take 1 tablet (50 mg total) by mouth every 6 (six) hours as needed. Patient not  taking: Reported on 05/02/2017 09/29/16   Ward, Delice Bison, DO  vitamin C (ASCORBIC ACID) 500 MG tablet Take 500 mg by mouth daily.    [provider]    Allergies:   Allergies  Allergen Reactions  . Cleocin [Clindamycin Hcl] Diarrhea    Diarrhea   . Lisinopril Other (See Comments)    Elevated potassium per pt report  . Amoxicillin Diarrhea and Nausea Only  . Bactrim [Sulfamethoxazole-Trimethoprim] Diarrhea and Nausea Only  . Ciprofloxacin Itching    Social History:  reports that she quit smoking about 8 years ago. She quit after 15.00 years of use. she has never used smokeless tobacco. She reports that she drinks alcohol. She reports that she does not use drugs.  Family History: Family History  Problem Relation Age of Onset  . Hyperlipidemia Mother   . Dementia Mother     Physical Exam: Vitals:   05/04/17 2145 05/04/17 2200 05/04/17 2215 05/04/17 2311  BP: 119/74 121/65 125/63 138/80  Pulse: 91 89 87 93  Resp: 14 19 17 15   Temp:    98.4 F (36.9 C)  TempSrc:    Oral  SpO2: 97% 100% 98% 97%  Weight:      Height:        General:  Alert and oriented times three, well developed and nourished, no acute  distress Eyes: PERRLA, pink conjunctiva, no scleral icterus ENT: Moist oral mucosa, neck supple, no thyromegaly Lungs: clear to ascultation, no wheeze, no crackles, no use of accessory muscles Cardiovascular: regular rate and rhythm, no regurgitation, no gallops, no murmurs. No carotid bruits, no JVD Abdomen: soft, positive BS, non-tender, non-distended, no organomegaly, not an acute abdomen GU: not examined Neuro: CN II - XII grossly intact, sensation intact Musculoskeletal: strength 5/5 all extremities, no clubbing, cyanosis or edema,  edema, erythema, very large blister extending from 5th digit to mid sole on sole of left foot, purple Right foot open heeling transmet amputation. Does not appear infected Skin: no rash, no subcutaneous crepitation, no decubitus Psych: appropriate patient   Labs on Admission:  Recent Labs    05/04/17 1848  NA 134*  K 5.5*  CL 102  CO2 19*  GLUCOSE 243*  BUN 49*  CREATININE 2.66*  CALCIUM 8.3*   Recent Labs    05/04/17 1848  AST 34  ALT 41  ALKPHOS 300*  BILITOT 0.3  PROT 7.8  ALBUMIN 2.8*   No results for input(s): LIPASE, AMYLASE in the last 72 hours. Recent Labs    05/04/17 1848  WBC 18.9*  NEUTROABS 16.8*  HGB 9.8*  HCT 29.8*  MCV 81.2  PLT 539*   No results for input(s): CKTOTAL, CKMB, CKMBINDEX, TROPONINI in the last 72 hours. Invalid input(s): POCBNP No results for input(s): DDIMER in the last 72 hours. No results for input(s): HGBA1C in the last 72 hours. No results for input(s): CHOL, HDL, LDLCALC, TRIG, CHOLHDL, LDLDIRECT in the last 72 hours. No results for input(s): TSH, T4TOTAL, T3FREE, THYROIDAB in the last 72 hours.  Invalid input(s): FREET3 No results for input(s): VITAMINB12, FOLATE, FERRITIN, TIBC, IRON, RETICCTPCT in the last 72 hours.  Micro Results: No results found for this or any previous visit (from the past 240 hour(s)).   Radiological Exams on Admission: Dg Chest 2 View  Result Date:  05/04/2017 CLINICAL DATA:  Patient reports fever of 103.2 last night with nausea and vomiting. EXAM: CHEST  2 VIEW COMPARISON:  09/29/2016 FINDINGS: The heart size and mediastinal contours are  within normal limits. Both lungs are clear. Chronic right fourth rib fracture. No acute osseous appearing abnormality. IMPRESSION: No active cardiopulmonary disease. Electronically Signed   By: Ashley Royalty M.D.   On: 05/04/2017 18:38   Dg Foot Complete Left  Result Date: 05/04/2017 CLINICAL DATA:  New blister EXAM: LEFT FOOT - COMPLETE 3+ VIEW COMPARISON:  Numerous priors dating back through 10/15/2014. FINDINGS: Diffuse osteopenia of the left foot with slight rocker bottom appearance of the midfoot on the lateral view. 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 Soft tissue swelling is seen along the lateral aspect of the forefoot with soft tissue emphysema at the base of the fourth toe. There is a soft tissue ulceration along the plantar lateral aspect of the midfoot. No loss of cortical margins to suggest osteomyelitis. Midfoot osteoarthritis is stable. Bony protuberance along the posteromedial aspect of the navicular is also stable. Generalized soft tissue swelling about the malleoli and included distal leg. No acute fracture or suspicious osseous lesions. IMPRESSION: 1. Soft tissue ulceration along the plantar lateral aspect of the midfoot with soft tissue emphysema along the plantar lateral aspect of the forefoot and extending along the fourth digit. 2. No radiographic manifestation of osteomyelitis. 3. 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 4. 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 Electronically Signed   By: Ashley Royalty M.D.   On: 05/04/2017 18:49    Assessment/Plan Present on Admission: . Cellulitis -Admit to MedSurg -Cultures x2 collected -Resume discontinue IV antibiotics vancomycin and cefepime,  pharmacy to dose -MRI left lower extremity -Orthopedic consult placed  Acute on chronic kidney disease -Gentle IV fluid dehydration, BMP in a.m.  Hyperkalemia -Due to increased creatinine  -IV fluid hydration and Kayexalate.  Repeat BMP in a.m.  . 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 Peripheral vascular disease -Status post femoropopliteal bypass  Peripheral neuropathy -Aware  . Chronic anemia -At baseline  Cataracts -Patient scheduled for surgery on Tuesday.  Drops continued, will defer to a.m. team who will decide discharged date if patient should continue her eye drops.  She may not be able to make her appointment for her cataract removal  Anselmo Reihl 05/04/2017, 11:34 PM

## 2017-05-04 NOTE — ED Notes (Signed)
[facility] here for transport.  

## 2017-05-04 NOTE — ED Triage Notes (Signed)
Pt reports she sees infectious for chronic foot wound, had an appt with them this week. Saw homecare nurse yesterday and wound had a new "bubble" and nurse advised her to start taking antibiotics again (Cipro that she was Rx'ed from [facility]). Pt reports temp of 103.2 last night with n/v. States today fever is down but blistered area on left foot is larger

## 2017-05-04 NOTE — Progress Notes (Signed)
47 yo female with hx of dm2, has celllulitis of the foot and fever, ED requests admission for fever/ cellulitis

## 2017-05-05 DIAGNOSIS — E11621 Type 2 diabetes mellitus with foot ulcer: Secondary | ICD-10-CM | POA: Diagnosis present

## 2017-05-05 DIAGNOSIS — L97509 Non-pressure chronic ulcer of other part of unspecified foot with unspecified severity: Secondary | ICD-10-CM

## 2017-05-05 LAB — CBC
HCT: 25.9 % — ABNORMAL LOW (ref 36.0–46.0)
Hemoglobin: 8.4 g/dL — ABNORMAL LOW (ref 12.0–15.0)
MCH: 27.1 pg (ref 26.0–34.0)
MCHC: 32.4 g/dL (ref 30.0–36.0)
MCV: 83.5 fL (ref 78.0–100.0)
PLATELETS: 463 10*3/uL — AB (ref 150–400)
RBC: 3.1 MIL/uL — AB (ref 3.87–5.11)
RDW: 16.8 % — AB (ref 11.5–15.5)
WBC: 12.7 10*3/uL — AB (ref 4.0–10.5)

## 2017-05-05 LAB — BASIC METABOLIC PANEL
Anion gap: 8 (ref 5–15)
BUN: 45 mg/dL — AB (ref 6–20)
CALCIUM: 7.3 mg/dL — AB (ref 8.9–10.3)
CO2: 19 mmol/L — ABNORMAL LOW (ref 22–32)
CREATININE: 2.58 mg/dL — AB (ref 0.44–1.00)
Chloride: 105 mmol/L (ref 101–111)
GFR calc non Af Amer: 21 mL/min — ABNORMAL LOW (ref >60)
GFR, EST AFRICAN AMERICAN: 25 mL/min — AB (ref >60)
Glucose, Bld: 240 mg/dL — ABNORMAL HIGH (ref 65–99)
Potassium: 4.5 mmol/L (ref 3.5–5.1)
SODIUM: 132 mmol/L — AB (ref 135–145)

## 2017-05-05 LAB — URIC ACID: URIC ACID, SERUM: 7.1 mg/dL — AB (ref 2.3–6.6)

## 2017-05-05 LAB — OSMOLALITY: Osmolality: 301 mOsm/kg — ABNORMAL HIGH (ref 275–295)

## 2017-05-05 LAB — GLUCOSE, CAPILLARY
GLUCOSE-CAPILLARY: 362 mg/dL — AB (ref 65–99)
GLUCOSE-CAPILLARY: 75 mg/dL (ref 65–99)
GLUCOSE-CAPILLARY: 71 mg/dL (ref 65–99)
Glucose-Capillary: 187 mg/dL — ABNORMAL HIGH (ref 65–99)
Glucose-Capillary: 175 mg/dL — ABNORMAL HIGH (ref 65–99)

## 2017-05-05 LAB — CREATININE, URINE, RANDOM: CREATININE, URINE: 25.66 mg/dL

## 2017-05-05 LAB — HIV ANTIBODY (ROUTINE TESTING W REFLEX): HIV Screen 4th Generation wRfx: NONREACTIVE

## 2017-05-05 LAB — OSMOLALITY, URINE: Osmolality, Ur: 372 mOsm/kg (ref 300–900)

## 2017-05-05 LAB — SODIUM, URINE, RANDOM: Sodium, Ur: 99 mmol/L

## 2017-05-05 MED ORDER — FOLIC ACID 1 MG PO TABS
1.0000 mg | ORAL_TABLET | Freq: Every day | ORAL | Status: DC
  Administered 2017-05-05 – 2017-05-10 (×6): 1 mg via ORAL
  Filled 2017-05-05 (×6): qty 1

## 2017-05-05 MED ORDER — PRAVASTATIN SODIUM 40 MG PO TABS
40.0000 mg | ORAL_TABLET | Freq: Every day | ORAL | Status: DC
  Administered 2017-05-05 – 2017-05-10 (×6): 40 mg via ORAL
  Filled 2017-05-05 (×6): qty 1

## 2017-05-05 MED ORDER — ACETAMINOPHEN 650 MG RE SUPP: 650.0000 mg | Freq: Four times a day (QID) | RECTAL | Status: DC | PRN

## 2017-05-05 MED ORDER — ACETAMINOPHEN 325 MG PO TABS
650.0000 mg | ORAL_TABLET | Freq: Four times a day (QID) | ORAL | Status: DC | PRN
  Administered 2017-05-05: 650 mg via ORAL
  Filled 2017-05-05: qty 2

## 2017-05-05 MED ORDER — POLYETHYLENE GLYCOL 3350 17 G PO PACK: 17.0000 g | PACK | Freq: Every day | ORAL | Status: DC | PRN

## 2017-05-05 MED ORDER — PREDNISOLONE ACETATE 1 % OP SUSP
1.0000 [drp] | Freq: Four times a day (QID) | OPHTHALMIC | Status: DC
  Filled 2017-05-05: qty 5

## 2017-05-05 MED ORDER — INSULIN GLARGINE 100 UNIT/ML ~~LOC~~ SOLN
8.0000 [IU] | Freq: Two times a day (BID) | SUBCUTANEOUS | Status: DC
  Administered 2017-05-05 (×3): 8 [IU] via SUBCUTANEOUS
  Filled 2017-05-05 (×4): qty 0.08

## 2017-05-05 MED ORDER — INSULIN ASPART 100 UNIT/ML ~~LOC~~ SOLN
0.0000 [IU] | Freq: Every day | SUBCUTANEOUS | Status: DC
  Administered 2017-05-05: 5 [IU] via SUBCUTANEOUS

## 2017-05-05 MED ORDER — RISAQUAD PO CAPS
1.0000 | ORAL_CAPSULE | Freq: Every day | ORAL | Status: DC
  Administered 2017-05-05 – 2017-05-10 (×6): 1 via ORAL
  Filled 2017-05-05 (×6): qty 1

## 2017-05-05 MED ORDER — DM-GUAIFENESIN ER 30-600 MG PO TB12
1.0000 | ORAL_TABLET | Freq: Two times a day (BID) | ORAL | Status: DC | PRN
  Administered 2017-05-05: 1 via ORAL
  Filled 2017-05-05: qty 1

## 2017-05-05 MED ORDER — DEXTROSE 5 % IV SOLN
1.0000 g | INTRAVENOUS | Status: DC
  Administered 2017-05-05 – 2017-05-09 (×5): 1 g via INTRAVENOUS
  Filled 2017-05-05 (×5): qty 1

## 2017-05-05 MED ORDER — KETOROLAC TROMETHAMINE 0.5 % OP SOLN
1.0000 [drp] | Freq: Four times a day (QID) | OPHTHALMIC | Status: DC
  Administered 2017-05-05 (×4): 1 [drp] via OPHTHALMIC
  Filled 2017-05-05: qty 5

## 2017-05-05 MED ORDER — ASPIRIN 325 MG PO TABS
325.0000 mg | ORAL_TABLET | Freq: Every day | ORAL | Status: DC
  Administered 2017-05-05 – 2017-05-10 (×5): 325 mg via ORAL
  Filled 2017-05-05 (×5): qty 1

## 2017-05-05 MED ORDER — SODIUM CHLORIDE 0.9 % IV SOLN
INTRAVENOUS | Status: DC
  Administered 2017-05-05 (×2): via INTRAVENOUS

## 2017-05-05 MED ORDER — SODIUM POLYSTYRENE SULFONATE PO POWD
30.0000 g | Freq: Once | ORAL | Status: AC
  Administered 2017-05-05: 30 g via ORAL
  Filled 2017-05-05: qty 30

## 2017-05-05 MED ORDER — ONDANSETRON HCL 4 MG PO TABS: 4.0000 mg | ORAL_TABLET | Freq: Four times a day (QID) | ORAL | Status: DC | PRN

## 2017-05-05 MED ORDER — SODIUM CHLORIDE 0.9 % IV SOLN
INTRAVENOUS | Status: DC
  Administered 2017-05-05 (×2): via INTRAVENOUS

## 2017-05-05 MED ORDER — INSULIN ASPART 100 UNIT/ML ~~LOC~~ SOLN
0.0000 [IU] | Freq: Three times a day (TID) | SUBCUTANEOUS | Status: DC
  Administered 2017-05-05 (×2): 2 [IU] via SUBCUTANEOUS
  Administered 2017-05-07: 4 [IU] via SUBCUTANEOUS
  Administered 2017-05-07: 3 [IU] via SUBCUTANEOUS

## 2017-05-05 MED ORDER — CYCLOPENTOLATE HCL 1 % OP SOLN
1.0000 [drp] | Freq: Three times a day (TID) | OPHTHALMIC | Status: DC
Start: 2017-05-05 — End: 2017-05-10
  Administered 2017-05-05 (×3): 1 [drp] via OPHTHALMIC
  Filled 2017-05-05 (×2): qty 2

## 2017-05-05 MED ORDER — MORPHINE SULFATE (PF) 4 MG/ML IV SOLN: 2.0000 mg | INTRAVENOUS | Status: DC | PRN

## 2017-05-05 MED ORDER — SODIUM POLYSTYRENE SULFONATE 15 GM/60ML PO SUSP
30.0000 g | Freq: Once | ORAL | Status: DC
  Administered 2017-05-05: 30 g via ORAL
  Filled 2017-05-05: qty 120

## 2017-05-05 MED ORDER — AMLODIPINE BESYLATE 5 MG PO TABS
2.5000 mg | ORAL_TABLET | Freq: Every day | ORAL | Status: DC
  Administered 2017-05-05 – 2017-05-09 (×5): 2.5 mg via ORAL
  Filled 2017-05-05 (×5): qty 1

## 2017-05-05 MED ORDER — FUROSEMIDE 40 MG PO TABS: 40.0000 mg | ORAL_TABLET | Freq: Every day | ORAL | Status: DC | PRN

## 2017-05-05 MED ORDER — POLYMYXIN B-TRIMETHOPRIM 10000-0.1 UNIT/ML-% OP SOLN
1.0000 [drp] | Freq: Four times a day (QID) | OPHTHALMIC | Status: DC
  Administered 2017-05-05 (×4): 1 [drp] via OPHTHALMIC
  Filled 2017-05-05: qty 10

## 2017-05-05 MED ORDER — BRIMONIDINE TARTRATE 0.2 % OP SOLN
1.0000 [drp] | Freq: Two times a day (BID) | OPHTHALMIC | Status: DC
  Administered 2017-05-05 (×3): 1 [drp] via OPHTHALMIC
  Filled 2017-05-05: qty 5

## 2017-05-05 MED ORDER — ONDANSETRON HCL 4 MG/2ML IJ SOLN: 4.0000 mg | Freq: Four times a day (QID) | INTRAMUSCULAR | Status: DC | PRN

## 2017-05-05 MED ORDER — VANCOMYCIN HCL IN DEXTROSE 750-5 MG/150ML-% IV SOLN
750.0000 mg | INTRAVENOUS | Status: DC
  Filled 2017-05-05: qty 150

## 2017-05-05 MED ORDER — CALCITRIOL 0.5 MCG PO CAPS
0.5000 ug | ORAL_CAPSULE | Freq: Every day | ORAL | Status: DC
  Administered 2017-05-05 – 2017-05-10 (×6): 0.5 ug via ORAL
  Filled 2017-05-05 (×6): qty 1

## 2017-05-05 MED ORDER — ENOXAPARIN SODIUM 30 MG/0.3ML ~~LOC~~ SOLN
30.0000 mg | Freq: Every day | SUBCUTANEOUS | Status: DC
  Administered 2017-05-05 – 2017-05-09 (×4): 30 mg via SUBCUTANEOUS
  Filled 2017-05-05 (×5): qty 0.3

## 2017-05-05 NOTE — Progress Notes (Signed)
Pharmacy Antibiotic Note  [name] is a 47 y.o. female admitted on 05/04/2017 with Wound infection.  Pharmacy has been consulted for Vancomycin, cefepime dosing.  Plan: Vancomycin 1gm iv x1, then 750mg  iv q48hr  Goal AUC = 400 - 500 for all indications, except meningitis (goal AUC > 500 and Cmin 15-20 mcg/mL)  Cefepime 1gm iv x1, then 1gm iv q24hr  Height: 5\' 7"  (170.2 cm) Weight: 127 lb (57.6 kg) IBW/kg (Calculated) : 61.6  Temp (24hrs), Avg:99.1 F (37.3 C), Min:98.3 F (36.8 C), Max:100.3 F (37.9 C)  Recent Labs  Lab 05/04/17 1848 05/04/17 1902  WBC 18.9*  --   CREATININE 2.66*  --   LATICACIDVEN  --  1.76    Estimated Creatinine Clearance: 24.3 mL/min (A) (by C-G formula based on SCr of 2.66 mg/dL (H)).    Allergies  Allergen Reactions  . Cleocin [Clindamycin Hcl] Diarrhea    Diarrhea   . Lisinopril Other (See Comments)    Elevated potassium per pt report  . Amoxicillin Diarrhea and Nausea Only    Has patient had a PCN reaction causing immediate rash, facial/tongue/throat swelling, SOB or lightheadedness with hypotension: No Has patient had a PCN reaction causing severe rash involving mucus membranes or skin necrosis: No Has patient had a PCN reaction that required hospitalization: No Has patient had a PCN reaction occurring within the last 10 years: No If all of the above answers are "NO", then may proceed with Cephalosporin use.   . Bactrim [Sulfamethoxazole-Trimethoprim] Diarrhea and Nausea Only  . Ciprofloxacin Itching    Antimicrobials this admission: Vancomycin 05/04/2017 >> Cefepime 05/04/2017 >>   Dose adjustments this admission: -  Microbiology results: pending  Thank you for allowing pharmacy to be a part of this patient's care.  [name] 05/05/2017 5:27 AM

## 2017-05-05 NOTE — Consult Note (Signed)
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  CKD IV Poor dentition  Plan: 1. Continue vanco/cefepime 2. F/u BCx 3. Surgical f/u 4. Image her L foot- MRI pending 5. Needs improved DM control (she is currently not happy about her diet and has  ordered wings from [employer]) 6. Continued WOC f/u 7. Check HIV and Hep C 8. Needs dental f/u  Comment- She is improving (no fever, WBC better) on her current anbx. Fortunately Dr Megan Salon, who follows her in ID clinic will be assumming her care in AM.   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 acidophilus  1 capsule Oral Daily  . amLODipine  2.5 mg Oral Daily  . aspirin  325 mg Oral Daily  . brimonidine  1 drop Right Eye BID  . calcitRIOL  0.5 mcg Oral Daily  . cyclopentolate  1 drop Right Eye TID  . enoxaparin (LOVENOX) injection  30 mg Subcutaneous QHS  . folic acid  1 mg Oral Daily  . insulin aspart  0-5 Units Subcutaneous QHS  . insulin aspart  0-9 Units Subcutaneous TID WC  . insulin glargine  8 Units Subcutaneous BID  . ketorolac  1 drop Right Eye QID  . pravastatin  40 mg Oral Daily  . [START ON 05/07/2017] prednisoLONE acetate  1 drop Right Eye QID  . trimethoprim-polymyxin b  1 drop Both Eyes QID    HPI:  [name] is a 47 y.o. female with hx of uncontrolled DM, multiple prev diabetic foot infections and surgeries, seen in ID on 11-1 after developing a R transmet stump. Her wound Cx grew Pseudomonas and a MRI showed osteomyelitis. She had cipro (10-17), flagyl (10-18) and zyvox added 10-31).  She completed 6-7 weeks of anbx on 11-29 and was seen in ID f/u 12-20. She was felt to be doing well.   She was seen in ID on 1-31 and was noted to have swelling and redness of her LLE. This was felt to be due to venous insufficiency. She did was to f/u in 1 week.   She came to ED on 2-2 after noting a blister on her L foot on 2-1. She had temp to 103.2 as well as n/v.  I discussed her case with ED and she was started on vanco/cefepime.   She had had f/u with home nurse during this time.   Review of Systems: Review of Systems  Constitutional: Positive for fever. Negative for chills.  Eyes:       Decreased vision, pending cataract surgery.   Respiratory: Negative for cough and shortness of breath.   Gastrointestinal: Positive for constipation and diarrhea.  Genitourinary: Negative for dysuria.  Neurological: Positive for sensory change.    Past Medical History:  Diagnosis Date  . Anemia   . C. difficile diarrhea 09/26/2014  . Cataracts, both eyes   . Cellulitis of right foot 06/02/2014  . CKD (chronic kidney disease) stage 3, GFR 30-59 ml/min (HCC)    sees Dr Justin Mend- Stage IV  . Diabetic ulcer of right foot ([place]) 06/02/2014  . DVT (deep venous thrombosis) ([place]) 10/2014   one in each one in leg- PICC line   . H/O seasonal allergies   . Heart murmur    told once when she was pregnant- no furter mention- was in notes 11/2015- had Echo 8 /4/17  . History of blood transfusion   . Hypercholesteremia   . Hypertension   . Left foot infection   . Neuropathy    feet  . Neuropathy in diabetes ([place])   . Peripheral vascular disease ([place])   . Staphylococcus aureus bacteremia 10/2014  . Type 1  diabetes [facility])    onset age 23    Social History   Tobacco Use  . Smoking status: Former Smoker    Years: 15.00    Last attempt to quit: 05/16/2008    Years since quitting: 8.9  . Smokeless tobacco: Never Used  Substance Use Topics  . Alcohol use: Yes    Comment: Occasional  . Drug use: No    Family History  Problem Relation Age of Onset  . Hyperlipidemia Mother   . Dementia Mother      Medications:  Scheduled: . acidophilus  1 capsule Oral Daily  . amLODipine  2.5 mg Oral Daily  . aspirin  325 mg Oral Daily  . brimonidine  1 drop Right Eye BID  . calcitRIOL  0.5 mcg Oral Daily  . cyclopentolate  1 drop Right Eye TID  . enoxaparin (LOVENOX) injection  30 mg Subcutaneous QHS  . folic acid  1 mg Oral Daily  . insulin aspart  0-5 Units Subcutaneous QHS  . insulin aspart  0-9 Units Subcutaneous TID WC  . insulin glargine  8 Units Subcutaneous BID  . ketorolac  1 drop Right Eye QID  . pravastatin  40 mg Oral Daily  . [START ON 05/07/2017] prednisoLONE acetate  1 drop Right Eye QID  . trimethoprim-polymyxin b  1 drop Both Eyes QID    Abtx:  Anti-infectives (From admission, onward)   Start     Dose/Rate Route Frequency Ordered Stop   05/06/17 2200  vancomycin (VANCOCIN) IVPB 750 mg/150 ml premix     750 mg 150 mL/hr over 60 Minutes Intravenous Every 48 hours 05/05/17 0527     05/05/17 2000  ceFEPIme (MAXIPIME) 1 g in dextrose 5 % 50 mL IVPB     1 g 100 mL/hr over 30 Minutes Intravenous Every 24 hours 05/05/17 0528     05/04/17 2032  ceFEPIme (MAXIPIME) 1 g injection  Status:  Discontinued    Comments:  [name]   : cabinet override      05/04/17 2032 05/05/17 0059   05/04/17 1945  vancomycin (VANCOCIN) IVPB 1000 mg/200 mL premix     1,000 mg 200 mL/hr over 60 Minutes Intravenous  Once 05/04/17 1944 05/04/17 2224   05/04/17 1945  ceFEPIme (MAXIPIME) 1 g in dextrose 5 % 50 mL IVPB     1 g 100 mL/hr over 30 Minutes Intravenous  Once 05/04/17 1944 05/04/17 2123          OBJECTIVE: Blood pressure (!) 120/59,  pulse 82, temperature 98.5 F (36.9 C), temperature source Oral, resp. rate 18, height '5\' 7"'$  (1.702 m), weight 57.6 kg (127 lb), last menstrual period 04/02/2017, SpO2 100 %.  Physical Exam  Constitutional: She is oriented to person, place, and time and well-developed, well-nourished, and in no distress. No distress.  HENT:  Mouth/Throat: Abnormal dentition. Dental caries present. No oropharyngeal exudate.  Eyes: EOM are normal. Pupils are equal, round, and reactive to light.  Neck: Neck supple.  Cardiovascular: Normal rate, regular rhythm and normal heart sounds.  Pulmonary/Chest: Effort normal and breath sounds normal.  Abdominal: Soft. Bowel sounds are normal. There is no tenderness. There is no rebound.  Musculoskeletal: She exhibits edema.  Lymphadenopathy:    She has no cervical adenopathy.  Neurological: She is alert and oriented to person, place, and time.  Skin: She is not diaphoretic.     Psychiatric: Mood and affect normal.   Media Information   Document Information   Photos    05/05/2017 16:34  Attached To:  Hospital Encounter on 05/04/17  Source Information   Deatra Mcmahen, Doroteo Bradford, MD  [id number]   Media Information   Document Information   Photos    05/05/2017 16:34  Attached To:  Hospital Encounter on 05/04/17  Source Information   Campbell Riches, MD  [id number]       Lab Results Results for orders placed or performed during the hospital encounter of 05/04/17 (from the past 48 hour(s))  Blood Culture (routine x 2)     Status: None (Preliminary result)   Collection Time: 05/04/17  6:00 PM  Result Value Ref Range   Specimen Description      BLOOD LEFT HAND Performed at [facility], [place] [address] [place], [place] [address]    Special Requests      IN PEDIATRIC BOTTLE Blood Culture adequate volume Performed at [facility], [address] [place], [place]  [address]    Culture PENDING    Report Status PENDING   Comprehensive metabolic panel     Status: Abnormal   Collection Time: 05/04/17  6:48 PM  Result Value Ref Range   Sodium 134 (L) 135 - 145 mmol/L   Potassium 5.5 (H) 3.5 - 5.1 mmol/L   Chloride 102 101 - 111 mmol/L   CO2 19 (L) 22 - 32 mmol/L   Glucose, Bld 243 (H) 65 - 99 mg/dL   BUN 49 (H) 6 - 20 mg/dL   Creatinine, Ser 2.66 (H) 0.44 - 1.00 mg/dL   Calcium 8.3 (L) 8.9 - 10.3 mg/dL   Total Protein 7.8 6.5 - 8.1 g/dL   Albumin 2.8 (L) 3.5 - 5.0 g/dL   AST 34 15 - 41 U/L   ALT 41 14 - 54 U/L   Alkaline Phosphatase 300 (H) 38 - 126 U/L   Total Bilirubin 0.3 0.3 - 1.2 mg/dL   GFR calc non Af Amer 21 (L) >60 mL/min   GFR calc Af Amer 24 (L) >60 mL/min    Comment: (NOTE) The eGFR has been calculated using the CKD EPI equation. This calculation has not been validated in all clinical situations. eGFR's persistently <60 mL/min signify possible Chronic Kidney Disease.    Anion gap 13 5 - 15    Comment: Performed at [facility], [address] [place], [place] [address]  CBC WITH DIFFERENTIAL     Status: Abnormal   Collection Time: 05/04/17  6:48 PM  Result Value Ref Range   WBC 18.9 (H) 4.0 - 10.5 K/uL   RBC 3.67 (L) 3.87 - 5.11 MIL/uL   Hemoglobin 9.8 (L) 12.0 - 15.0 g/dL   HCT 29.8 (L) 36.0 - 46.0 %   MCV 81.2 78.0 - 100.0 fL   MCH 26.7 26.0 - 34.0 pg   MCHC 32.9 30.0 - 36.0 g/dL   RDW 16.3 (H) 11.5 - 15.5 %   Platelets 539 (H) 150 - 400 K/uL   Neutrophils Relative % 89 %   Neutro Abs 16.8 (H) 1.7 - 7.7 K/uL   Lymphocytes Relative 5 %   Lymphs Abs 1.0 0.7 - 4.0 K/uL   Monocytes Relative 5 %   Monocytes Absolute 0.9 0.1 - 1.0 K/uL   Eosinophils Relative 1 %   Eosinophils Absolute 0.2 0.0 - 0.7 K/uL   Basophils Relative 0 %   Basophils Absolute 0.0 0.0 - 0.1 K/uL    Comment: Performed at [facility], [address] [place], [place] [address]  Blood Culture (routine x 2)     Status: None  (Preliminary result)   Collection Time: 05/04/17  6:50 PM  Result Value Ref Range   Specimen Description      BLOOD LEFT ARM Performed at [facility], [address], [place], [place] [address]    Special Requests      BOTTLES DRAWN AEROBIC AND ANAEROBIC Blood Culture adequate volume Performed at [facility], [address] [place], [place] [address]    Culture PENDING    Report Status PENDING   I-Stat CG4 Lactic Acid, ED  (not at  [facility])     Status: None   Collection Time: 05/04/17  7:02 PM  Result Value Ref Range   Lactic Acid, Venous 1.76 0.5 - 1.9 mmol/L  Glucose, capillary     Status: Abnormal   Collection Time: 05/04/17 11:16 PM  Result Value Ref Range   Glucose-Capillary 416 (H) 65 - 99 mg/dL  Glucose, capillary     Status: Abnormal   Collection Time: 05/05/17  1:24 AM  Result Value Ref Range   Glucose-Capillary 362 (H) 65 - 99 mg/dL  Uric acid     Status: Abnormal   Collection Time: 05/05/17  6:00 AM  Result Value Ref Range   Uric Acid, Serum 7.1 (H) 2.3 - 6.6 mg/dL    Comment: Performed at [facility], [place] [address], [place], [place] [address]  Basic metabolic panel     Status: Abnormal   Collection Time: 05/05/17  6:09 AM  Result Value Ref Range   Sodium 132 (L) 135 - 145 mmol/L   Potassium 4.5 3.5 - 5.1 mmol/L   Chloride 105 101 - 111 mmol/L   CO2 19 (L) 22 - 32 mmol/L   Glucose, Bld 240 (H) 65 - 99 mg/dL   BUN 45 (H) 6 - 20 mg/dL   Creatinine, Ser 2.58 (H) 0.44 - 1.00 mg/dL   Calcium 7.3 (L) 8.9 - 10.3 mg/dL   GFR calc non Af Amer 21 (L) >60 mL/min   GFR calc Af Amer 25 (L) >60 mL/min    Comment: (NOTE) The eGFR has been calculated using the CKD EPI equation. This calculation has not been validated in all clinical situations. eGFR's persistently <60 mL/min signify possible Chronic Kidney Disease.    Anion gap 8 5 - 15    Comment: Performed at [facility], [place] [address] [place],  [place]  [address]  CBC     Status: Abnormal   Collection Time: 05/05/17  6:09 AM  Result Value Ref Range   WBC 12.7 (H) 4.0 - 10.5 K/uL   RBC 3.10 (L) 3.87 - 5.11 MIL/uL   Hemoglobin 8.4 (L) 12.0 - 15.0 g/dL   HCT 25.9 (L) 36.0 - 46.0 %   MCV 83.5 78.0 - 100.0 fL   MCH 27.1 26.0 - 34.0 pg   MCHC 32.4 30.0 - 36.0 g/dL   RDW 16.8 (H) 11.5 - 15.5 %   Platelets 463 (H) 150 - 400 K/uL    Comment: Performed at [facility], [place] [address] [place], [place] [address]  Glucose, capillary     Status: Abnormal   Collection Time: 05/05/17  7:18 AM  Result Value Ref Range   Glucose-Capillary 187 (H) 65 - 99 mg/dL  Osmolality     Status: Abnormal   Collection Time: 05/05/17  9:29 AM  Result Value Ref Range   Osmolality 301 (H) 275 - 295 mOsm/kg    Comment: Performed at [place] [address], [place], [place] [address]  Creatinine, urine, random     Status: None   Collection Time: 05/05/17  9:45 AM  Result Value Ref Range   Creatinine, Urine 25.66 mg/dL    Comment: Performed at [place] [address], [place], [place] [address]  Osmolality, urine     Status: None   Collection Time: 05/05/17  9:45 AM  Result Value Ref Range   Osmolality, Ur 372 300 - 900 mOsm/kg    Comment: Performed at [place] [address], [place], [place] [address]  Sodium, urine, random     Status: None   Collection Time: 05/05/17  9:45 AM  Result Value Ref Range   Sodium, Ur 99 mmol/L    Comment: Performed at [place] [address], [place], [place] [address]  Glucose, capillary     Status: None   Collection Time: 05/05/17 11:29 AM  Result Value Ref Range   Glucose-Capillary 75 65 - 99 mg/dL      Component Value Date/Time   SDES  05/04/2017 1850    BLOOD LEFT ARM Performed at [facility], [place] [address], [place], [place] [address]    SPECREQUEST  05/04/2017 1850    BOTTLES DRAWN AEROBIC AND ANAEROBIC Blood Culture adequate volume Performed at  [facility], [address] [place],  [address]    CULT PENDING 05/04/2017 1850   REPTSTATUS PENDING 05/04/2017 1850   Dg Chest 2 View  Result Date: 05/04/2017 CLINICAL DATA:  Patient reports fever of 103.2 last night with nausea and vomiting. EXAM: CHEST  2 VIEW COMPARISON:  09/29/2016 FINDINGS: The heart size and mediastinal contours are within normal limits. Both lungs are clear. Chronic right fourth rib fracture. No acute osseous appearing abnormality. IMPRESSION: No active cardiopulmonary disease. Electronically Signed   By: Ashley Royalty M.D.   On: 05/04/2017 18:38   Dg Foot Complete Left  Result Date: 05/04/2017 CLINICAL DATA:  New blister EXAM: LEFT FOOT - COMPLETE 3+ VIEW COMPARISON:  Numerous priors dating back through 10/15/2014. FINDINGS: Diffuse osteopenia of the left foot with slight rocker bottom appearance of the midfoot on the lateral view. 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 Soft tissue swelling is seen along the lateral aspect of the forefoot with  soft tissue emphysema at the base of the fourth toe. There is a soft tissue ulceration along the plantar lateral aspect of the midfoot. No loss of cortical margins to suggest osteomyelitis. Midfoot osteoarthritis is stable. Bony protuberance along the posteromedial aspect of the navicular is also stable. Generalized soft tissue swelling about the malleoli and included distal leg. No acute fracture or suspicious osseous lesions. IMPRESSION: 1. Soft tissue ulceration along the plantar lateral aspect of the midfoot with soft tissue emphysema along the plantar lateral aspect of the forefoot and extending along the fourth digit. 2. No radiographic manifestation of osteomyelitis. 3. 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 4. 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 Electronically Signed   By: Ashley Royalty M.D.   On:  05/04/2017 18:49   Recent Results (from the past 240 hour(s))  Blood Culture (routine x 2)     Status: None (Preliminary result)   Collection Time: 05/04/17  6:00 PM  Result Value Ref Range Status   Specimen Description   Final    BLOOD LEFT HAND Performed at [facility], [place] [address], [place], [place] [address]    Special Requests   Final    IN PEDIATRIC BOTTLE Blood Culture adequate volume Performed at [facility], [address] [place], [place] [address]    Culture PENDING  Incomplete   Report Status PENDING  Incomplete  Blood Culture (routine x 2)     Status: None (Preliminary result)   Collection Time: 05/04/17  6:50 PM  Result Value Ref Range Status   Specimen Description   Final    BLOOD LEFT ARM Performed at [facility], [place] [address], [place], [place] [address]    Special Requests   Final    BOTTLES DRAWN AEROBIC AND ANAEROBIC Blood Culture adequate volume Performed at [facility], [address] [place], [place] [address]    Culture PENDING  Incomplete   Report Status PENDING  Incomplete    Microbiology: Recent Results (from the past 240 hour(s))  Blood Culture (routine x 2)     Status: None (Preliminary result)   Collection Time: 05/04/17  6:00 PM  Result Value Ref Range Status   Specimen Description   Final    BLOOD LEFT HAND Performed at [facility], [place] [address], [place], [place] [address]    Special Requests   Final    IN PEDIATRIC BOTTLE Blood Culture adequate volume Performed at [facility], [address] [place], [place] [address]    Culture PENDING  Incomplete   Report Status PENDING  Incomplete  Blood Culture (routine x 2)     Status: None (Preliminary result)   Collection Time: 05/04/17  6:50 PM  Result Value Ref Range Status   Specimen Description   Final    BLOOD LEFT ARM Performed at [facility], [place] [address] [place], [place] [address]    Special Requests   Final      BOTTLES DRAWN AEROBIC AND ANAEROBIC Blood Culture adequate volume Performed at [facility], [address] [place], [place] [address]    Culture PENDING  Incomplete   Report Status PENDING  Incomplete    Radiographs and labs were personally reviewed by me.   Bobby Rumpf, MD [facility] [facility] [phone] 05/05/2017, 4:10 PM

## 2017-05-05 NOTE — Consult Note (Signed)
[place] Nurse wound consult note Reason for Consult: Bialteral plantar aspect neuropathic wounds.  Has been seen by outpatient [facility] in the past, but nt in the past month. Is hopeful to be a candidate for HBOT-for the right foot wound. This wound (right first met head) has been managed by IP. New onset blood filled blister on left foot is concerning, note also full thickness wound below. Wound type:Neuropathic, infectious Pressure Injury POA: NA Measurement: Left foot with 7cm x 8cm blood filled blister on the metatarsal head region. Distal to this blister is a full thickness wound at the midfoot measuring 2.4cm x 3cm x 0.2cm with surrounding callous. No drainage. Right foot first met head with 3cm round calloused area with full thickness wound in center measuring 1cm x 0.6cm x 0.4cm. Red wound bed. Scant drainage. Wound bed:See above Drainage (amount, consistency, odor) See above Periwound:intact, dry Dressing procedure/placement/frequency: Patient is well versed in wound care products: requests iodosorb, manuka honey and collagen with silver (Promogram).  Unfortunately, we do not carry any of those products in the acute care setting.  She is amenable to anything except silver; I will begin twice daily antimicrobial care with xeroform gauze dressings.  Suggest consultation with orthopedics; if you agree, please order/arrange.  Note:  Patient does not wish to see Dr. Eather Colas. Also would suggest notifying IP that patient is in house. She alst saw Dr. Megan Salon on 05/02/17.  [place] nursing team will not follow, but will remain available to this patient, the nursing and medical teams.  Please re-consult if needed. Thanks, Maudie Flakes, MSN, RN, [place], [facility]  Pager# [phone]

## 2017-05-05 NOTE — Progress Notes (Addendum)
@IPLOG @        PROGRESS NOTE                                                                                                                                                                                                             Patient Demographics:    [name], is a 47 y.o. female, DOB - Feb 14, 1971, UEA:[id number]  Admit date - 05/04/2017   Admitting Physician Jani Gravel, MD  Outpatient Primary MD for the patient is Patient, No Pcp Per  LOS - 1  Chief Complaint  Patient presents with  . 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   Subjective:    [name] today has, No headache, No chest pain, No abdominal pain - No Nausea, No new weakness tingling or numbness, No Cough - SOB. Mild L foot pain.   Assessment  & Plan :     1.  Left diabetic foot ulcer and cellulitis with sepsis - recently finished antibiotics for right foot infection and ulcer, MRI pending, have consulted podiatrist [name] who knows the patient well, will consult ID in the a.m. as well, for now continue empiric IV vancomycin cefepime, sepsis physiology has resolved patient appears nontoxic, continue supportive care, likely will require OR visit tomorrow.  2.  Poorly controlled DM type I.  Recent A1c greater than 10, currently on Lantus and sliding scale will continue to monitor.  Lab Results  Component Value Date   HGBA1C 10.6 04/08/2017   CBG (last 3)  Recent Labs    05/05/17 0124 05/05/17 0718 05/05/17 1129  GLUCAP 362* 187* 75     3.  Dyslipidemia.  Continue statin.  4.  Hypertension.  On Norvasc continue to monitor.  5.   CKD 5  - Baseline creatinine close to she is close to baseline.  Monitor.     Diet : Diet Carb Modified Fluid consistency: Thin; Room service appropriate? Yes    Family Communication  :  None  Code Status :  Full  Disposition Plan  :  TBD  Consults  : Podiatry, ID  Procedures  :    MRI of the left foot pending.  DVT Prophylaxis  :  Lovenox    Lab Results  Component Value Date   PLT 463 (H) 05/05/2017    Inpatient Medications  Scheduled Meds: . acidophilus  1 capsule Oral Daily  . amLODipine  2.5 mg Oral Daily  . aspirin  325 mg Oral Daily  . brimonidine  1 drop Right Eye BID  . calcitRIOL  0.5 mcg Oral Daily  . cyclopentolate  1 drop Right Eye TID  . enoxaparin (LOVENOX) injection  30 mg Subcutaneous QHS  . folic acid  1 mg Oral Daily  . insulin aspart  0-5 Units Subcutaneous QHS  . insulin aspart  0-9 Units Subcutaneous TID WC  . insulin glargine  8 Units Subcutaneous BID  . ketorolac  1 drop Right Eye QID  . pravastatin  40 mg Oral Daily  . [START ON 05/07/2017] prednisoLONE acetate  1 drop Right Eye QID  . trimethoprim-polymyxin b  1 drop Both Eyes QID   Continuous Infusions: . sodium chloride    . ceFEPime (MAXIPIME) IV    . [START ON 05/06/2017] vancomycin     PRN Meds:.acetaminophen **OR** acetaminophen, morphine injection, ondansetron **OR** ondansetron (ZOFRAN) IV, polyethylene glycol  Antibiotics  :    Anti-infectives (From admission, onward)   Start     Dose/Rate Route Frequency Ordered Stop   05/06/17 2200  vancomycin (VANCOCIN) IVPB 750 mg/150 ml premix     750 mg 150 mL/hr over 60 Minutes Intravenous Every 48 hours 05/05/17 0527     05/05/17 2000  ceFEPIme (MAXIPIME) 1 g in dextrose 5 % 50 mL IVPB     1 g 100 mL/hr over 30 Minutes Intravenous Every 24 hours 05/05/17 0528     05/04/17 2032  ceFEPIme (MAXIPIME) 1 g injection  Status:  Discontinued    Comments:  [name]   : cabinet override      05/04/17 2032 05/05/17 0059   05/04/17 1945   vancomycin (VANCOCIN) IVPB 1000 mg/200 mL premix     1,000 mg 200 mL/hr over 60 Minutes Intravenous  Once 05/04/17 1944 05/04/17 2224   05/04/17 1945  ceFEPIme (MAXIPIME) 1 g in dextrose 5 % 50 mL IVPB     1 g 100 mL/hr over 30 Minutes Intravenous  Once 05/04/17 1944 05/04/17 2123         Objective:   Vitals:   05/04/17 2215 05/04/17 2311 05/05/17 0524 05/05/17 1100  BP: 125/63 138/80 120/65 (!) 142/81  Pulse: 87 93 79 82  Resp: 17 15 17 18   Temp:  98.4 F (36.9 C) 98.3 F (36.8 C) 98.7 F (37.1 C)  TempSrc:  Oral Oral Oral  SpO2: 98% 97% 99% 100%  Weight:      Height:        Wt Readings from Last 3 Encounters:  05/04/17 57.6 kg (127 lb)  05/02/17 57.6 kg (127 lb)  04/08/17 54.9 kg (121 lb)     Intake/Output Summary (Last 24 hours) at 05/05/2017 1303 Last data filed at 05/05/2017 1222 Gross per 24 hour  Intake 1510 ml  Output -  Net 1510 ml     Physical Exam  Awake Alert, Oriented X 3, No new F.N deficits, somewhat dramatic affect [place].AT,PERRAL Supple Neck,No JVD, No cervical lymphadenopathy appriciated.  Symmetrical Chest wall movement, Good air movement bilaterally, CTAB RRR,No Gallops,Rubs or new Murmurs, No Parasternal Heave +ve B.Sounds, Abd Soft, No tenderness, No organomegaly appriciated, No rebound - guarding or rigidity. No Cyanosis, Clubbing or edema, No new Rash or bruise R foot  trans metatarsal amputation and healing Ulcer, L foot doral-lateral large blistering Ulcer with cellulitis.    Data Review:    CBC Recent Labs  Lab 05/04/17 1848 05/05/17 0609  WBC 18.9* 12.7*  HGB 9.8* 8.4*  HCT 29.8* 25.9*  PLT 539* 463*  MCV 81.2 83.5  MCH 26.7 27.1  MCHC 32.9 32.4  RDW 16.3* 16.8*  LYMPHSABS 1.0  --   MONOABS 0.9  --   EOSABS 0.2  --   BASOSABS 0.0  --     Chemistries  Recent Labs  Lab 05/04/17 1848 05/05/17 0609  NA 134* 132*  K 5.5* 4.5  CL 102 105  CO2 19* 19*  GLUCOSE 243* 240*  BUN 49* 45*  CREATININE 2.66* 2.58*  CALCIUM  8.3* 7.3*  AST 34  --   ALT 41  --   ALKPHOS 300*  --   BILITOT 0.3  --    ------------------------------------------------------------------------------------------------------------------ No results for input(s): CHOL, HDL, LDLCALC, TRIG, CHOLHDL, LDLDIRECT in the last 72 hours.  Lab Results  Component Value Date   HGBA1C 10.6 04/08/2017   ------------------------------------------------------------------------------------------------------------------ No results for input(s): TSH, T4TOTAL, T3FREE, THYROIDAB in the last 72 hours.  Invalid input(s): FREET3 ------------------------------------------------------------------------------------------------------------------ No results for input(s): VITAMINB12, FOLATE, FERRITIN, TIBC, IRON, RETICCTPCT in the last 72 hours.  Coagulation profile No results for input(s): INR, PROTIME in the last 168 hours.  No results for input(s): DDIMER in the last 72 hours.  Cardiac Enzymes No results for input(s): CKMB, TROPONINI, MYOGLOBIN in the last 168 hours.  Invalid input(s): CK ------------------------------------------------------------------------------------------------------------------ No results found for: BNP  Micro Results Recent Results (from the past 240 hour(s))  Blood Culture (routine x 2)     Status: None (Preliminary result)   Collection Time: 05/04/17  6:00 PM  Result Value Ref Range Status   Specimen Description   Final    BLOOD LEFT HAND Performed at [facility], [place] [address], [place], [name] [address]    Special Requests   Final    IN PEDIATRIC BOTTLE Blood Culture adequate volume Performed at [facility], [address] [place], [place] [address]    Culture PENDING  Incomplete   Report Status PENDING  Incomplete  Blood Culture (routine x 2)     Status: None (Preliminary result)   Collection Time: 05/04/17  6:50 PM  Result Value Ref Range Status   Specimen Description   Final    BLOOD LEFT  ARM Performed at [facility], [place] [address], [place], [place] [address]    Special Requests   Final    BOTTLES DRAWN AEROBIC AND ANAEROBIC Blood Culture adequate volume Performed at [facility], [address] [place], [place] [address]    Culture PENDING  Incomplete   Report Status PENDING  Incomplete    Radiology Reports Dg Chest 2 View  Result Date: 05/04/2017 CLINICAL DATA:  Patient reports fever of 103.2 last night with nausea and vomiting. EXAM: CHEST  2 VIEW COMPARISON:  09/29/2016 FINDINGS: The heart size and mediastinal contours are within normal limits. Both lungs are clear. Chronic right fourth rib fracture. No acute osseous appearing abnormality. IMPRESSION: No active cardiopulmonary disease. Electronically Signed   By: Ashley Royalty M.D.   On: 05/04/2017 18:38   Dg Foot Complete Left  Result Date: 05/04/2017 CLINICAL DATA:  New blister EXAM: LEFT FOOT - COMPLETE 3+ VIEW COMPARISON:  Numerous priors dating back through 10/15/2014. FINDINGS: Diffuse osteopenia of the  left foot with slight rocker bottom appearance of the midfoot on the lateral view. 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 Soft tissue swelling is seen along the lateral aspect of the forefoot with soft tissue emphysema at the base of the fourth toe. There is a soft tissue ulceration along the plantar lateral aspect of the midfoot. No loss of cortical margins to suggest osteomyelitis. Midfoot osteoarthritis is stable. Bony protuberance along the posteromedial aspect of the navicular is also stable. Generalized soft tissue swelling about the malleoli and included distal leg. No acute fracture or suspicious osseous lesions. IMPRESSION: 1. Soft tissue ulceration along the plantar lateral aspect of the midfoot with soft tissue emphysema along the plantar lateral aspect of the forefoot and extending along the fourth digit. 2. No radiographic manifestation of  osteomyelitis. 3. 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 4. 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 Electronically Signed   By: Ashley Royalty M.D.   On: 05/04/2017 18:49    Time Spent in minutes  30   Lala Lund M.D on 05/05/2017 at 1:03 PM  Between 7am to 7pm - Pager - [phone] ( page via [email or web address], text pages only, please mention full 10 digit call back number). After 7pm go to [email or web address] - password [email or web address]

## 2017-05-06 ENCOUNTER — Inpatient Hospital Stay (HOSPITAL_COMMUNITY): Payer: Medicare Other

## 2017-05-06 ENCOUNTER — Encounter (HOSPITAL_COMMUNITY)
Admission: EM | Disposition: A | Discharge status: Home / Self Care | Payer: Self-pay | Source: Home / Self Care | Attending: Internal Medicine

## 2017-05-06 ENCOUNTER — Other Ambulatory Visit: Payer: Self-pay

## 2017-05-06 ENCOUNTER — Inpatient Hospital Stay (HOSPITAL_COMMUNITY): Payer: Medicare Other | Admitting: Certified Registered"

## 2017-05-06 ENCOUNTER — Encounter (HOSPITAL_COMMUNITY): Payer: Self-pay | Admitting: *Deleted

## 2017-05-06 DIAGNOSIS — L03116 Cellulitis of left lower limb: Principal | ICD-10-CM

## 2017-05-06 DIAGNOSIS — X58XXXD Exposure to other specified factors, subsequent encounter: Secondary | ICD-10-CM

## 2017-05-06 DIAGNOSIS — Z888 Allergy status to other drugs, medicaments and biological substances status: Secondary | ICD-10-CM

## 2017-05-06 DIAGNOSIS — S90822D Blister (nonthermal), left foot, subsequent encounter: Secondary | ICD-10-CM

## 2017-05-06 DIAGNOSIS — Z881 Allergy status to other antibiotic agents status: Secondary | ICD-10-CM

## 2017-05-06 DIAGNOSIS — A480 Gas gangrene: Secondary | ICD-10-CM

## 2017-05-06 HISTORY — PX: IRRIGATION AND DEBRIDEMENT ABSCESS: SHX5252

## 2017-05-06 LAB — GLUCOSE, CAPILLARY
GLUCOSE-CAPILLARY: 100 mg/dL — AB (ref 65–99)
GLUCOSE-CAPILLARY: 28 mg/dL — AB (ref 65–99)
GLUCOSE-CAPILLARY: 168 mg/dL — AB (ref 65–99)
GLUCOSE-CAPILLARY: 105 mg/dL — AB (ref 65–99)
Glucose-Capillary: 37 mg/dL — CL (ref 65–99)
Glucose-Capillary: 163 mg/dL — ABNORMAL HIGH (ref 65–99)
Glucose-Capillary: 144 mg/dL — ABNORMAL HIGH (ref 65–99)
Glucose-Capillary: 127 mg/dL — ABNORMAL HIGH (ref 65–99)
Glucose-Capillary: 127 mg/dL — ABNORMAL HIGH (ref 65–99)

## 2017-05-06 LAB — CBC
HEMATOCRIT: 26.9 % — AB (ref 36.0–46.0)
Hemoglobin: 8.7 g/dL — ABNORMAL LOW (ref 12.0–15.0)
MCH: 26.6 pg (ref 26.0–34.0)
MCHC: 32.3 g/dL (ref 30.0–36.0)
MCV: 82.3 fL (ref 78.0–100.0)
PLATELETS: 522 10*3/uL — AB (ref 150–400)
RBC: 3.27 MIL/uL — ABNORMAL LOW (ref 3.87–5.11)
RDW: 16.8 % — ABNORMAL HIGH (ref 11.5–15.5)
WBC: 12.1 10*3/uL — AB (ref 4.0–10.5)

## 2017-05-06 LAB — IRON AND TIBC
IRON: 9 ug/dL — AB (ref 28–170)
SATURATION RATIOS: 3 % — AB (ref 10.4–31.8)
TIBC: 263 ug/dL (ref 250–450)
UIBC: 254 ug/dL

## 2017-05-06 LAB — BASIC METABOLIC PANEL
ANION GAP: 8 (ref 5–15)
BUN: 40 mg/dL — ABNORMAL HIGH (ref 6–20)
CALCIUM: 7.4 mg/dL — AB (ref 8.9–10.3)
CO2: 20 mmol/L — AB (ref 22–32)
Chloride: 108 mmol/L (ref 101–111)
Creatinine, Ser: 2.16 mg/dL — ABNORMAL HIGH (ref 0.44–1.00)
GFR calc Af Amer: 31 mL/min — ABNORMAL LOW (ref >60)
GFR, EST NON AFRICAN AMERICAN: 26 mL/min — AB (ref >60)
GLUCOSE: 105 mg/dL — AB (ref 65–99)
Potassium: 4.4 mmol/L (ref 3.5–5.1)
Sodium: 136 mmol/L (ref 135–145)

## 2017-05-06 LAB — MRSA PCR SCREENING: MRSA by PCR: NEGATIVE

## 2017-05-06 LAB — HIV ANTIBODY (ROUTINE TESTING W REFLEX): HIV SCREEN 4TH GENERATION: NONREACTIVE

## 2017-05-06 LAB — FERRITIN: FERRITIN: 67 ng/mL (ref 11–307)

## 2017-05-06 LAB — RETICULOCYTES
RBC.: 3.23 MIL/uL — ABNORMAL LOW (ref 3.87–5.11)
RETIC CT PCT: 1.3 % (ref 0.4–3.1)
Retic Count, Absolute: 42 10*3/uL (ref 19.0–186.0)

## 2017-05-06 LAB — FOLATE: FOLATE: 3.7 ng/mL — AB (ref >5.9)

## 2017-05-06 LAB — VITAMIN B12: Vitamin B-12: 457 pg/mL (ref 180–914)

## 2017-05-06 LAB — HCG, SERUM, QUALITATIVE: Preg, Serum: NEGATIVE

## 2017-05-06 SURGERY — IRRIGATION AND DEBRIDEMENT ABSCESS
Anesthesia: General | Laterality: Left

## 2017-05-06 MED ORDER — ONDANSETRON HCL 4 MG/2ML IJ SOLN
INTRAMUSCULAR | Status: AC
  Filled 2017-05-06: qty 2

## 2017-05-06 MED ORDER — HYDROCODONE-ACETAMINOPHEN 5-325 MG PO TABS
1.0000 | ORAL_TABLET | ORAL | Status: DC | PRN
  Administered 2017-05-07 (×3): 1 via ORAL
  Filled 2017-05-06 (×3): qty 1

## 2017-05-06 MED ORDER — CHLORHEXIDINE GLUCONATE 4 % EX LIQD
60.0000 mL | Freq: Once | CUTANEOUS | Status: DC
  Filled 2017-05-06: qty 60

## 2017-05-06 MED ORDER — INSULIN GLARGINE 100 UNIT/ML ~~LOC~~ SOLN: 8.0000 [IU] | Freq: Every day | SUBCUTANEOUS | Status: DC

## 2017-05-06 MED ORDER — SODIUM CHLORIDE 0.9 % IR SOLN
Status: DC | PRN
  Administered 2017-05-06: 3000 mL

## 2017-05-06 MED ORDER — LACTATED RINGERS IV SOLN
INTRAVENOUS | Status: DC
  Administered 2017-05-06: 19:00:00 via INTRAVENOUS

## 2017-05-06 MED ORDER — OXYCODONE-ACETAMINOPHEN 5-325 MG PO TABS
1.0000 | ORAL_TABLET | ORAL | Status: DC | PRN
  Administered 2017-05-09: 2 via ORAL
  Filled 2017-05-06: qty 2

## 2017-05-06 MED ORDER — PROPOFOL 10 MG/ML IV BOLUS
INTRAVENOUS | Status: DC | PRN
  Administered 2017-05-06: 140 mg via INTRAVENOUS

## 2017-05-06 MED ORDER — FENTANYL CITRATE (PF) 100 MCG/2ML IJ SOLN: 25.0000 ug | INTRAMUSCULAR | Status: DC | PRN

## 2017-05-06 MED ORDER — SODIUM CHLORIDE 0.9% FLUSH
3.0000 mL | Freq: Two times a day (BID) | INTRAVENOUS | Status: DC
  Administered 2017-05-07 – 2017-05-10 (×6): 3 mL via INTRAVENOUS

## 2017-05-06 MED ORDER — PROPOFOL 10 MG/ML IV BOLUS
INTRAVENOUS | Status: AC
  Filled 2017-05-06: qty 20

## 2017-05-06 MED ORDER — SODIUM CHLORIDE 0.9 % IV SOLN: 250.0000 mL | INTRAVENOUS | Status: DC | PRN

## 2017-05-06 MED ORDER — LIDOCAINE HCL (CARDIAC) 20 MG/ML IV SOLN
INTRAVENOUS | Status: DC | PRN
  Administered 2017-05-06: 100 mg via INTRAVENOUS

## 2017-05-06 MED ORDER — ONDANSETRON HCL 4 MG/2ML IJ SOLN
INTRAMUSCULAR | Status: DC | PRN
  Administered 2017-05-06: 4 mg via INTRAVENOUS

## 2017-05-06 MED ORDER — FENTANYL CITRATE (PF) 250 MCG/5ML IJ SOLN
INTRAMUSCULAR | Status: AC
  Filled 2017-05-06: qty 5

## 2017-05-06 MED ORDER — MIDAZOLAM HCL 2 MG/2ML IJ SOLN
INTRAMUSCULAR | Status: AC
  Filled 2017-05-06: qty 2

## 2017-05-06 MED ORDER — DEXTROSE-NACL 5-0.45 % IV SOLN
INTRAVENOUS | Status: AC
  Administered 2017-05-06: 11:00:00 via INTRAVENOUS

## 2017-05-06 MED ORDER — FENTANYL CITRATE (PF) 100 MCG/2ML IJ SOLN
INTRAMUSCULAR | Status: DC | PRN
  Administered 2017-05-06: 50 ug via INTRAVENOUS

## 2017-05-06 MED ORDER — LACTATED RINGERS IV SOLN
INTRAVENOUS | Status: DC | PRN
  Administered 2017-05-06: 20:00:00 via INTRAVENOUS

## 2017-05-06 MED ORDER — PROMETHAZINE HCL 25 MG PO TABS
12.5000 mg | ORAL_TABLET | ORAL | Status: DC | PRN
Start: 2017-05-06 — End: 2017-05-10

## 2017-05-06 MED ORDER — INSULIN GLARGINE 100 UNIT/ML ~~LOC~~ SOLN
6.0000 [IU] | Freq: Every day | SUBCUTANEOUS | Status: DC
  Filled 2017-05-06 (×2): qty 0.06

## 2017-05-06 MED ORDER — DEXTROSE 50 % IV SOLN
INTRAVENOUS | Status: AC
  Administered 2017-05-06: 25 mL via INTRAVENOUS
  Filled 2017-05-06: qty 50

## 2017-05-06 MED ORDER — INSULIN GLARGINE 100 UNIT/ML ~~LOC~~ SOLN
4.0000 [IU] | Freq: Once | SUBCUTANEOUS | Status: DC
  Filled 2017-05-06: qty 0.04

## 2017-05-06 MED ORDER — MIDAZOLAM HCL 5 MG/5ML IJ SOLN
INTRAMUSCULAR | Status: DC | PRN
  Administered 2017-05-06: 1 mg via INTRAVENOUS

## 2017-05-06 MED ORDER — SODIUM CHLORIDE 0.9% FLUSH
3.0000 mL | INTRAVENOUS | Status: DC | PRN
  Administered 2017-05-08: 3 mL via INTRAVENOUS
  Filled 2017-05-06: qty 3

## 2017-05-06 MED ORDER — DEXTROSE 50 % IV SOLN
25.0000 mL | Freq: Once | INTRAVENOUS | Status: AC
  Administered 2017-05-06: 25 mL via INTRAVENOUS

## 2017-05-06 SURGICAL SUPPLY — 27 items
BLADE HEX COATED 2.75 (ELECTRODE) ×2 IMPLANT
BNDG COHESIVE 4X5 TAN STRL (GAUZE/BANDAGES/DRESSINGS) ×1 IMPLANT
BNDG COHESIVE 6X5 TAN STRL LF (GAUZE/BANDAGES/DRESSINGS) ×2 IMPLANT
COTTON STERILE ROLL (GAUZE/BANDAGES/DRESSINGS) ×1 IMPLANT
COVER SURGICAL LIGHT HANDLE (MISCELLANEOUS) ×2 IMPLANT
DRAPE U-SHAPE 47X51 STRL (DRAPES) ×2 IMPLANT
DRSG ADAPTIC 3X8 NADH LF (GAUZE/BANDAGES/DRESSINGS) ×1 IMPLANT
DURAPREP 26ML APPLICATOR (WOUND CARE) ×2 IMPLANT
ELECT REM PT RETURN 15FT ADLT (MISCELLANEOUS) ×2 IMPLANT
GAUZE SPONGE 4X4 12PLY STRL (GAUZE/BANDAGES/DRESSINGS) ×2 IMPLANT
GLOVE BIOGEL PI IND STRL 8.5 (GLOVE) ×1 IMPLANT
GLOVE BIOGEL PI INDICATOR 8.5 (GLOVE) ×3
GLOVE SURG ORTHO 9.0 STRL STRW (GLOVE) ×2 IMPLANT
HANDPIECE INTERPULSE COAX TIP (DISPOSABLE) ×2
KIT BASIN OR (CUSTOM PROCEDURE TRAY) ×2 IMPLANT
MANIFOLD NEPTUNE II (INSTRUMENTS) ×2 IMPLANT
NS IRRIG 1000ML POUR BTL (IV SOLUTION) ×2 IMPLANT
PACK ORTHO EXTREMITY (CUSTOM PROCEDURE TRAY) ×2 IMPLANT
PADDING CAST COTTON 6X4 STRL (CAST SUPPLIES) ×1 IMPLANT
POSITIONER SURGICAL ARM (MISCELLANEOUS) ×1 IMPLANT
SET HNDPC FAN SPRY TIP SCT (DISPOSABLE) ×1 IMPLANT
SPONGE LAP 18X18 X RAY DECT (DISPOSABLE) ×1 IMPLANT
STOCKINETTE 8 INCH (MISCELLANEOUS) ×1 IMPLANT
SWAB COLLECTION DEVICE MRSA (MISCELLANEOUS) ×2 IMPLANT
SWAB CULTURE ESWAB REG 1ML (MISCELLANEOUS) ×2 IMPLANT
UNDERPAD 30X30 (UNDERPADS AND DIAPERS) ×2 IMPLANT
WATER STERILE IRR 1000ML POUR (IV SOLUTION) ×2 IMPLANT

## 2017-05-06 NOTE — Progress Notes (Signed)
CRITICAL VALUE ALERT  Critical Value:  CBG 37  Date & Time Notied: 05/06/2017 0728  Provider Notified: Candiss Norse  Orders Received/Actions taken: Patient given Salli Real  Will recheck CBG in 15 mins.

## 2017-05-06 NOTE — Transfer of Care (Signed)
Immediate Anesthesia Transfer of Care Note  Patient: [name]  Procedure(s) Performed: IRRIGATION AND DEBRIDEMENT ABSCESS LEFT FOOT (Left )  Patient Location: PACU  Anesthesia Type:General  Level of Consciousness: awake, alert , oriented and patient cooperative  Airway & Oxygen Therapy: Patient Spontanous Breathing and Patient connected to face mask oxygen  Post-op Assessment: Report given to RN, Post -op Vital signs reviewed and stable and Patient moving all extremities X 4  Post vital signs: stable  Last Vitals:  Vitals:   05/06/17 0535 05/06/17 1930  BP: (!) 142/70 (!) 171/86  Pulse: 82 80  Resp: 14 20  Temp: 36.9 C 36.9 C  SpO2: 98% 100%    Last Pain:  Vitals:   05/06/17 1930  TempSrc: Oral  PainSc:          Complications: No apparent anesthesia complications

## 2017-05-06 NOTE — H&P (View-Only) (Signed)
   PODIATRY CONSULTATION    Patient Demographics:    [name], is a 47 y.o. female, DOB - 1970/11/12, EXH:[id number]  Admit date - 05/04/2017   Admitting Physician Jani Gravel, MD  Outpatient Primary MD for the patient is Patient, No Pcp Per     HPI: 47 year old female with an established history with a [facility] and history of recurrent diabetic foot infections with amputation  admitted to [facility] on 05/04/2017.  Patient has been on IV antibiotics and currently being managed by infectious disease.  Podiatry was consulted for evaluation and likely surgical intervention regarding incision and drainage of the left foot.  Past Medical History:  Diagnosis Date  . Anemia   . C. difficile diarrhea 09/26/2014  . Cataracts, both eyes   . Cellulitis of right foot 06/02/2014  . CKD (chronic kidney disease) stage 3, GFR 30-59 ml/min (HCC)    sees Dr Justin Mend- Stage IV  . Diabetic ulcer of right foot ([place]) 06/02/2014  . DVT (deep venous thrombosis) ([place]) 10/2014   one in each one in leg- PICC line   . H/O seasonal allergies   . Heart murmur    told once when she was pregnant- no furter mention- was in notes 11/2015- had Echo 8 /4/17  . History of blood transfusion   . Hypercholesteremia   . Hypertension   . Left foot infection   . Neuropathy    feet  . Neuropathy in diabetes ([place])   . Peripheral vascular disease ([place])   . Staphylococcus aureus bacteremia 10/2014  . Type 1 diabetes ([place])    onset age 34     Physical Exam: Large hemorrhagic bullous lesion noted encompassing the entire plantar midfoot and forefoot of the left foot.  Blister appears to be intact.  There is some localized cellulitis and edema and erythema to the foot.  Patient has peripheral neuropathy and is insensate.  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 -I informed the patient that she will likely need surgical incision and drainage and debridement and irrigation of the large abscess to the left foot. -MRI ordered today is still pending. -Continue IV antibiotics as per infectious disease -Orders placed for surgical incision and drainage with debridement.  Surgery tentatively scheduled for today after 5:30 PM.  Preoperative orders placed  Edrick Kins, DPM [facility]  Dr. Edrick Kins, DPM    [address], [place] [address]                Office [phone]  Fax [phone]

## 2017-05-06 NOTE — Anesthesia Preprocedure Evaluation (Signed)
Anesthesia Evaluation  Patient identified by MRN, date of birth, ID band Patient awake    Reviewed: Allergy & Precautions, NPO status , Patient's Chart, lab work & pertinent test results  History of Anesthesia Complications Negative for: history of anesthetic complications  Airway Mallampati: II  TM Distance: >3 FB Neck ROM: Full    Dental  (+) Chipped, Dental Advisory Given, Teeth Intact   Pulmonary COPD,  COPD inhaler, former smoker,    breath sounds clear to auscultation       Cardiovascular hypertension, Pt. on medications (-) angina+ Peripheral Vascular Disease   Rhythm:Regular Rate:Normal     Neuro/Psych negative neurological ROS     GI/Hepatic negative GI ROS, Neg liver ROS,   Endo/Other  diabetes, Type 1, Insulin Dependent  Renal/GU Renal InsufficiencyRenal disease (creat 4.94, K+ 4.0)     Musculoskeletal  (+) Arthritis , Osteoarthritis,    Abdominal   Peds  Hematology  (+) Blood dyscrasia (Hb 7.1), anemia ,   Anesthesia Other Findings Neuropathy in diabetes ([place])   Hypertension     Type 1 diabetes  .... BS 192 Neuropathy ( History of blood transfusion   CKD (chronic kidney disease) stage 3, GFR 30-59 ml/min  Reproductive/Obstetrics                             Anesthesia Physical  Anesthesia Plan  ASA: III  Anesthesia Plan: General   Post-op Pain Management:    Induction: Intravenous  PONV Risk Score and Plan: 2 and Ondansetron and Treatment may vary due to age or medical condition  Airway Management Planned: LMA  Additional Equipment:   Intra-op Plan:   Post-operative Plan: Extubation in OR  Informed Consent: I have reviewed the patients History and Physical, chart, labs and discussed the procedure including the risks, benefits and alternatives for the proposed anesthesia with the patient or authorized representative who has indicated his/her understanding and  acceptance.   Dental advisory given  Plan Discussed with: CRNA, Surgeon and Anesthesiologist  Anesthesia Plan Comments:         Anesthesia Quick Evaluation

## 2017-05-06 NOTE — Anesthesia Procedure Notes (Signed)
Procedure Name: LMA Insertion Performed by: Lissa Morales, CRNA Pre-anesthesia Checklist: Patient identified, Emergency Drugs available, Suction available and Patient being monitored Patient Re-evaluated:Patient Re-evaluated prior to induction Oxygen Delivery Method: Circle system utilized Preoxygenation: Pre-oxygenation with 100% oxygen Induction Type: IV induction Ventilation: Mask ventilation without difficulty LMA: LMA with gastric port inserted LMA Size: 4.0 Tube type: Oral Number of attempts: 1 Airway Equipment and Method: Stylet,  Oral airway and Video-laryngoscopy Placement Confirmation: positive ETCO2 Tube secured with: Tape Dental Injury: Teeth and Oropharynx as per pre-operative assessment

## 2017-05-06 NOTE — Consult Note (Addendum)
   PODIATRY CONSULTATION    Patient Demographics:    [name], is a 47 y.o. female, DOB - Jul 16, 1970, SNK:[id number]  Admit date - 05/04/2017   Admitting Physician Nancy Gravel, MD  Outpatient Primary MD for the patient is Patient, No Pcp Per     HPI: 47 year old female with an established history with a [facility] and history of recurrent diabetic foot infections with amputation  admitted to [facility] on 05/04/2017.  Patient has been on IV antibiotics and currently being managed by infectious disease.  Podiatry was consulted for evaluation and likely surgical intervention regarding incision and drainage of the left foot.  Past Medical History:  Diagnosis Date  . Anemia   . C. difficile diarrhea 09/26/2014  . Cataracts, both eyes   . Cellulitis of right foot 06/02/2014  . CKD (chronic kidney disease) stage 3, GFR 30-59 ml/min (HCC)    sees Dr [name]- Stage IV  . Diabetic ulcer of right foot ([place]) 06/02/2014  . DVT (deep venous thrombosis) ([place]) 10/2014   one in each one in leg- PICC line   . H/O seasonal allergies   . Heart murmur    told once when she was pregnant- no furter mention- was in notes 11/2015- had Echo 8 /4/17  . History of blood transfusion   . Hypercholesteremia   . Hypertension   . Left foot infection   . Neuropathy    feet  . Neuropathy in diabetes ([place])   . Peripheral vascular disease ([place])   . Staphylococcus aureus bacteremia 10/2014  . Type 1 diabetes ([place])    onset age 64     Physical Exam: Large hemorrhagic bullous lesion noted encompassing the entire plantar midfoot and forefoot of the left foot.  Blister appears to be intact.  There is some localized cellulitis and edema and erythema to the foot.  Patient has peripheral neuropathy and is insensate.  Assessment: -Poorly controlled type I diabetic -History of recurrent diabetic foot ulcerations with history of amputation -Cellulitis left foot with large hemorrhagic bullous  lesion-left plantar foot -Gas gangrene left foot  Plan of Care:  -Patient was evaluated this a.m. -I informed the patient that she will likely need surgical incision and drainage and debridement and irrigation of the large abscess to the left foot. Patient understands she is at risk of proximal amputation. Today we will proceed with incision and drainage without amputation at the moment. If the infection does not improve and MRI indicates osteomyelitis we may need to return to the OR for amputation depending on findings from MRI -MRI ordered today scheduled for later in the day. -Continue IV antibiotics as per infectious disease -Orders placed for surgical incision and drainage with debridement.  Surgery tentatively scheduled for today after 5:30 PM.  Preoperative orders placed  [name], DPM [facility]  Dr. Edrick [name], DPM    [address], [place] [address]                Office [phone]  Fax [phone]

## 2017-05-06 NOTE — Progress Notes (Signed)
Inpatient Diabetes Program Recommendations  AACE/ADA: New Consensus Statement on Inpatient Glycemic Control (2015)  Target Ranges:  Prepandial:   less than 140 mg/dL      Peak postprandial:   less than 180 mg/dL (1-2 hours)      Critically ill patients:  140 - 180 mg/dL   Lab Results  Component Value Date   GLUCAP 100 (H) 05/06/2017   HGBA1C 10.6 04/08/2017    Review of Glycemic Control Results for KIEREN, RICCI (MRN [id number]) as of 05/06/2017 11:20  Ref. Range 05/05/2017 21:35 05/06/2017 07:26 05/06/2017 07:45 05/06/2017 08:18 05/06/2017 11:16  Glucose-Capillary Latest Ref Range: 65 - 99 mg/dL 71 37 (LL) 28 (LL) 100 (H) 168 (H)   Diabetes history: Type 1 DM Outpatient Diabetes medications: Humalog SSI, Lantus 12 Units in AM, 8 Units in PM Current orders for Inpatient glycemic control: Novolog 0-9 TID, Lantus 6 Units QHS  Inpatient Diabetes Program Recommendations:    Noted hypoglycemic event and thus changes made to Lantus 6 Units QHS.   Also, of note this patient was seeing Dr Dwyane Dee, endocrinologist and was previously on an insulin pump. She did not know how to use and changes settings. However, settings were as follows per note in Aug of 2017:   Insulin Pump - BASAL = Midnight = 0.3., 2 PM = 0.5, 5 PM = 0.6. Total basal:  Carb coverage 1: 18 g and correction 1: 60 with target 150, active insulin 4 hours Given these settings and her hypo event, will need to verify what she was actually taking at home. However, in the meantime would also recommend starting her on a custom correction scale:  Novolog Custom Correction Scale CBG <70 - implement hypoglycemia protocol   70-120- 0 121-150 - 0 151-200 - 1 201-250 - 2 251-300 - 3 301-350 - 4 351-400 - 5 >400- call MD  Thanks, Bronson Curb, MSN, RNC-OB Diabetes Coordinator [phone] (8a-5p)

## 2017-05-06 NOTE — Brief Op Note (Signed)
05/06/2017  9:24 PM  PATIENT:  [name]  47 y.o. female  PRE-OPERATIVE DIAGNOSIS:  LEFT FOOT ABCESS  POST-OPERATIVE DIAGNOSIS:  abscess left foot  PROCEDURE:  Procedure(s): IRRIGATION AND DEBRIDEMENT ABSCESS LEFT FOOT (Left)  SURGEON:  Surgeon(s) and Role:    Edrick Kins, DPM - Primary  PHYSICIAN ASSISTANT:   ASSISTANTS: none   ANESTHESIA:   general  EBL:  14mL  BLOOD ADMINISTERED:none  DRAINS: none   LOCAL MEDICATIONS USED:  NONE  SPECIMEN:  Source of Specimen:  Culture left foot abscess/gas gangrene  DISPOSITION OF SPECIMEN:  PATHOLOGY  COUNTS:  YES  TOURNIQUET:  No tourniquet utilized   DICTATION: .Sales executive  PLAN OF CARE: Admit to inpatient   PATIENT DISPOSITION:  PACU - hemodynamically stable.   Delay start of Pharmacological VTE agent (>24hrs) due to surgical blood loss or risk of bleeding: not applicable  Edrick Kins, DPM [facility]  Dr. Edrick Kins, DPM    [address], [place] [address]                Office [phone]  Fax [phone]

## 2017-05-06 NOTE — Care Management Note (Signed)
Case Management Note  Patient Details  Name: [name] MRN: [id number] Date of Birth: [birth year]  Subjective/Objective:                  Cellulitis of the left foot  Action/Plan: Date: May 06, 2017 Velva Harman, BSN, [place], [place] Chart and notes review for patient progress and needs. Will follow for case management and discharge needs. No cm or discharge needs present at time of this review. Next review date: [id number]  Expected Discharge Date:                  Expected Discharge Plan:  Home/Self Care  In-House Referral:     Discharge planning Services  CM Consult  Post Acute Care Choice:    Choice offered to:     DME Arranged:    DME Agency:     HH Arranged:    HH Agency:     Status of Service:  In process, will continue to follow  If discussed at Long Length of Stay Meetings, dates discussed:    Additional Comments:  Leeroy Cha, RN 05/06/2017, 10:22 AM

## 2017-05-06 NOTE — Interval H&P Note (Signed)
History and Physical Interval Note:  05/06/2017 5:23 PM  [name]  has presented today for surgery, with the diagnosis of LEFT FOOT ABCESS  The various methods of treatment have been discussed with the patient and family. After consideration of risks, benefits and other options for treatment, the patient has consented to  Procedure(s): IRRIGATION AND DEBRIDEMENT ABSCESS LEFT FOOT (Left) as a surgical intervention .  The patient's history has been reviewed, patient examined, no change in status, stable for surgery.  I have reviewed the patient's chart and labs.  Questions were answered to the patient's satisfaction.     Edrick Kins

## 2017-05-06 NOTE — Progress Notes (Signed)
[facility] Infusion Coordinator will follow with ID team in the event home IV ABX are needed at DC.  If patient discharges after hours, please call [phone].   Larry Sierras 05/06/2017, 11:05 AM

## 2017-05-06 NOTE — Progress Notes (Addendum)
CRITICAL VALUE ALERT  Critical Value:  CBG 28  Date & Time Notied:  05/06/2017 0747  Provider Notified: Dr. Candiss Norse 07:48  Orders Received/Actions taken: Given 12.5grams of D5.

## 2017-05-06 NOTE — Progress Notes (Signed)
OT Cancellation Note  Patient Details Name: [name] MRN: [id number] DOB: 06/29/1970   Cancelled Treatment:    Reason Eval/Treat Not Completed: Other (comment)  Medical issues which prohibited therapy--low blood sugar this a.m and pt scheduled for I&D today. Will hold OT today and check back on tomorrow. Thanks.     Mickel Baas Barrett, [place] 05/06/2017, 1:38 PM

## 2017-05-06 NOTE — Progress Notes (Signed)
PT Cancellation Note  Patient Details Name: [name] MRN: [id number] DOB: 1970-06-04   Cancelled Treatment:    Reason Eval/Treat Not Completed: Medical issues which prohibited therapy--low blood sugar this a.m and pt scheduled for I&D today. Will hold PT today and check back on tomorrow. Thanks.    Weston Anna, MPT Pager: [phone]

## 2017-05-06 NOTE — Progress Notes (Signed)
Patient ID: [name], female   DOB: 11-21-1970, 47 y.o.   MRN: [id number]          [facility]  Date of Admission:  05/04/2017           Day 2 vancomycin        Day 2 cefepime ASSESSMENT: She has developed acute left foot infection.  I will continue current antibiotics pending the results of MRI and incision and drainage.  PLAN: 1. Continue current antibiotics 2. 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 acidophilus  1 capsule Oral Daily  . amLODipine  2.5 mg Oral Daily  . aspirin  325 mg Oral Daily  . brimonidine  1 drop Right Eye BID  . calcitRIOL  0.5 mcg Oral Daily  . cyclopentolate  1 drop Right Eye TID  . enoxaparin (LOVENOX) injection  30 mg Subcutaneous QHS  . folic acid  1 mg Oral Daily  . insulin aspart  0-9 Units Subcutaneous TID WC  . insulin glargine  6 Units Subcutaneous QHS  . ketorolac  1 drop Right Eye QID  . pravastatin  40 mg Oral Daily  . [START ON 05/07/2017] prednisoLONE acetate  1 drop Right Eye QID  . trimethoprim-polymyxin b  1 drop Both Eyes QID   Continuous Infusions: . ceFEPime (MAXIPIME) IV Stopped (05/05/17 2009)  . dextrose 5 % and 0.45% NaCl 75 mL/hr at 05/06/17 1106  . vancomycin     PRN Meds:.acetaminophen **OR** acetaminophen, dextromethorphan-guaiFENesin, morphine injection, ondansetron **OR** ondansetron (ZOFRAN) IV, polyethylene glycol   SUBJECTIVE: [name] was in to see me in my clinic on 05/02/2017.  She had no signs of active infection in either foot.  The following morning she woke and had developed an acute hemorrhagic  blister on the bottom of her left foot.  She says she had a fever of 103.2 degrees at home.  She denies any known injury to her left foot.  She was admitted on 05/04/2017.  She was started on empiric antibiotic therapy and has an MRI scheduled for later this afternoon.  Surgery is planned at this evening.  Review of Systems: Review of Systems  Constitutional: Positive for chills and fever. Negative for diaphoresis.    Allergies  Allergen Reactions  . Cleocin [Clindamycin Hcl] Diarrhea    Diarrhea   . Lisinopril Other (See Comments)    Elevated potassium per pt report  . Amoxicillin Diarrhea and Nausea Only    Has patient had a PCN reaction causing immediate rash, facial/tongue/throat swelling, SOB or lightheadedness with hypotension: No Has patient had a PCN reaction causing severe rash involving mucus membranes or skin necrosis: No Has patient had a PCN reaction that required hospitalization: No Has patient had a PCN reaction occurring within the last 10 years: No If all of the above answers are "NO", then may proceed with Cephalosporin use.   . Bactrim [Sulfamethoxazole-Trimethoprim] Diarrhea and Nausea Only  . Ciprofloxacin Itching    OBJECTIVE: Vitals:   05/05/17 1100 05/05/17 1322 05/05/17 2137 05/06/17 0535  BP: (!) 142/81 (!) 120/59 (!) 156/73 (!) 142/70  Pulse: 82 82 86 82  Resp: 18 18 17 14   Temp: 98.7 F (37.1 C) 98.5 F (36.9 C) 99.1 F (37.3 C) 98.5 F (36.9 C)  TempSrc: Oral Oral Oral Oral  SpO2: 100% 100% 100% 98%  Weight:      Height:       Body mass index is 19.89 kg/m.  Physical Exam  Constitutional:  She is drowsy.  Her left foot is wrapped in a clean, dry gauze dressing.    Lab Results Lab Results  Component Value Date   WBC 12.1 (H) 05/06/2017   HGB 8.7 (L) 05/06/2017   HCT 26.9 (L) 05/06/2017   MCV 82.3 05/06/2017   PLT 522 (H) 05/06/2017    Lab Results  Component Value Date   CREATININE 2.16 (H) 05/06/2017   BUN 40 (H) 05/06/2017   NA  136 05/06/2017   K 4.4 05/06/2017   CL 108 05/06/2017   CO2 20 (L) 05/06/2017    Lab Results  Component Value Date   ALT 41 05/04/2017   AST 34 05/04/2017   ALKPHOS 300 (H) 05/04/2017   BILITOT 0.3 05/04/2017     Microbiology: Recent Results (from the past 240 hour(s))  Blood Culture (routine x 2)     Status: None (Preliminary result)   Collection Time: 05/04/17  6:00 PM  Result Value Ref Range Status   Specimen Description   Final    BLOOD LEFT HAND Performed at [facility], [address], [place], [place] [address]    Special Requests   Final    IN PEDIATRIC BOTTLE Blood Culture adequate volume Performed at [facility], [address] [place], [place] [address]    Culture PENDING  Incomplete   Report Status PENDING  Incomplete  Blood Culture (routine x 2)     Status: None (Preliminary result)   Collection Time: 05/04/17  6:50 PM  Result Value Ref Range Status   Specimen Description   Final    BLOOD LEFT ARM Performed at [facility], [place] [address], [place], [place] [address]    Special Requests   Final    BOTTLES DRAWN AEROBIC AND ANAEROBIC Blood Culture adequate volume Performed at [facility], [address] [place], [place] [address]    Culture PENDING  Incomplete   Report Status PENDING  Incomplete  MRSA PCR Screening     Status: None   Collection Time: 05/06/17  6:14 AM  Result Value Ref Range Status   MRSA by PCR NEGATIVE NEGATIVE Final    Comment:        The GeneXpert MRSA Assay (FDA approved for NASAL specimens only), is one component of a comprehensive MRSA colonization surveillance program. It is not intended to diagnose MRSA infection nor to guide or monitor treatment for MRSA infections. Performed at [facility], [place] [address] [place], [place] [address]     Michel Bickers, [facility] [phone] pager   [phone]  cell 05/06/2017, 1:37 PM

## 2017-05-06 NOTE — Progress Notes (Signed)
Spoke with RN at 220... Rn stated pt was going in for sx at 5pm today. Told RN we could do pt NOW only if someone could bring her. RN stated the MRI can wait until after sx even if it was done tomorrow. RN called back at 65 saying surgeon wants MRI before sx and that she would bring her now.

## 2017-05-06 NOTE — Op Note (Signed)
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  Incision and drainage left foot  Surgeon: Edrick Kins DPM  Anesthesia: General anesthesia.    Hemostasis: None EBL: 20 mL Materials: None Injectables: None Pathology: Deep wound culture sent to pathology for culture and sensitivity  Condition: The patient tolerated the procedure and anesthesia well. No complications noted or reported   Justification for procedure: The patient is a 47 y.o. 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 The patient was told benefits as well as possible side effects of the surgery. The patient consented for surgical correction. The patient consent form was reviewed. All patient questions were answered. No guarantees were expressed or implied. The patient and the surgeon both signed the patient consent form with the witness present and placed in the patient's chart. The patient understands that she is at high risk for a below-knee amputation or proximal amputation due to the nature and aggressiveness of her condition consisting of poorly controlled diabetes and gas gangrene.  Procedure in Detail: The patient was brought to the operating room, placed in the operating table in the supine position at which time an aseptic scrub and drape were performed about the patient's respective lower extremity after anesthesia was induced as described above. Attention was then directed to the surgical area where procedure number one commenced.  Procedure #1: Incision and drainage left foot Prior to incision the hemorrhagic blister to the plantar aspect of the foot was the removed in toto.   All epidermal and dermal skin was removed using curved Metzenbaum scissors.  After removal of the  dermal layer of skin the underlying ulcerations were visualized.  Incision was made beginning at the fourth digit of the left foot and carried proximal approximately to the level of the calcaneocuboid joint.  Heavy amount of purulent drainage was noted from multiple abscesses and compartments of the foot.  Heavy amount of nonviable, purulent tissue with strong malodor was noted. Excisional debridement of all the necrotic nonviable tissue down to healthier tissue was performed and exploration of all compartments of the foot were performed using a combination of sharp and blunt soft tissue dissection.  Deep tissue pockets extended proximally across the plantar aspect of the foot to the medial neurovascular bundle concerning for possible gas gangrene along the medial neurovascular bundle and posterior tibial tendon sheath.  At this time deep wound cultures were taken and sent to pathology for culture and sensitivity.  Pulse lavage in combination with 3 L of normal saline was utilized for copious irrigation of the foot wound. After excisional debridement of all the necrotic nonviable tissue and pulse lavage the foot wound was left open and packed with saline soaked wet-to-dry dressing.  Dry sterile dressings were then applied to all previously mentioned incision sites about the patient's lower extremity.   The patient was then transferred from the operating room to the recovery room having tolerated the procedure and anesthesia well. All vital signs are stable. After a brief stay in the recovery room the patient was readmitted to inpatient room with adequate prescriptions for analgesia. Verbal as well as written instructions were provided for the patient regarding wound care. The patient is to  keep the dressings clean dry and intact until they are to follow surgeon [name] in the office upon discharge.    Surgical Impression: Due to the heavy amount of purulent drainage and necrotic nonviable tissue with a gas gangrene extending proximal and encompassing the entire plantar aspect of the foot the patient will likely need below-knee amputation.  I do not believe that the condition of the foot is able to repair and heal given the patient's long-standing history of ulcerations with multiple partial amputations.  The patient would likely benefit from below-knee amputation.  Recommend vascular or orthopedic consultation.   Edrick Kins, DPM [facility]  Dr. Edrick Kins, [address]                                        [place], [place] [address]                Office [phone]  Fax [phone]

## 2017-05-06 NOTE — H&P (Signed)
Anesthesia H&P Update: History and Physical Exam reviewed; patient is OK for planned anesthetic and procedure. ? ?

## 2017-05-06 NOTE — Progress Notes (Signed)
Patient will only need Insulin coverage if CBG is above 175 per MD.

## 2017-05-06 NOTE — Progress Notes (Signed)
@IPLOG @        PROGRESS NOTE                                                                                                                                                                                                             Patient Demographics:    [name], is a 47 y.o. female, DOB - 18-Mar-1971, TML:[id number]  Admit date - 05/04/2017   Admitting Physician Jani Gravel, MD  Outpatient Primary MD for the patient is Patient, No Pcp Per  LOS - 2  Chief Complaint  Patient presents with  . 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   Subjective:   Patient in bed, appears comfortable, denies any headache, no fever, no chest pain or pressure, no shortness of breath , no abdominal pain. No focal weakness.   Assessment  & Plan :     1.  Left diabetic foot ulcer and cellulitis with sepsis - recently finished antibiotics for right foot infection and ulcer few weeks ago, MRI pending, cultures so far unrevealing, for now continue empiric IV vancomycin cefepime, sepsis physiology has resolved patient appears nontoxic, continue supportive care, consulted podiatry and ID likely to OR later on 05/06/2017 for incision and drainage.  2.  Poorly controlled DM type I.  Recent A1c greater than 10.5, currently on Lantus and sliding scale , CBG slightly low this morning, Lantus dose adjusted, question compliance at home as she is requiring very low doses of insulin while here, while n.p.o. will place her on gentle D5 drip to avoid hypoglycemic events in the OR, CBG every 2 while n.p.o.  Lab Results  Component Value Date   HGBA1C 10.6  04/08/2017   CBG (last 3)  Recent Labs    05/06/17 0726 05/06/17 0745 05/06/17 0818  GLUCAP 37* 28* 100*     3.  Dyslipidemia.  Continue statin.  4.  Hypertension.  On Norvasc continue to monitor.  5.  CKD 5  - Baseline creatinine close to 1.7, she is close to baseline.  Monitor.     Diet : Diet NPO time specified Except for: Sips with Meds    Family Communication  :  None  Code Status :  Full  Disposition Plan  :  TBD  Consults  : Podiatry, ID  Procedures  :    MRI of the left foot -   DVT Prophylaxis  :  Lovenox    Lab Results  Component Value Date   PLT 522 (H) 05/06/2017    Inpatient Medications  Scheduled Meds: . acidophilus  1 capsule Oral Daily  . amLODipine  2.5 mg Oral Daily  . aspirin  325 mg Oral Daily  . brimonidine  1 drop Right Eye BID  . calcitRIOL  0.5 mcg Oral Daily  . cyclopentolate  1 drop Right Eye TID  . enoxaparin (LOVENOX) injection  30 mg Subcutaneous QHS  . folic acid  1 mg Oral Daily  . insulin aspart  0-5 Units Subcutaneous QHS  . insulin aspart  0-9 Units Subcutaneous TID WC  . [START ON 05/07/2017] insulin glargine  8 Units Subcutaneous Daily  . ketorolac  1 drop Right Eye QID  . pravastatin  40 mg Oral Daily  . [START ON 05/07/2017] prednisoLONE acetate  1 drop Right Eye QID  . trimethoprim-polymyxin b  1 drop Both Eyes QID   Continuous Infusions: . ceFEPime (MAXIPIME) IV Stopped (05/05/17 2009)  . dextrose 5 % and 0.45% NaCl    . vancomycin     PRN Meds:.acetaminophen **OR** acetaminophen, dextromethorphan-guaiFENesin, morphine injection, ondansetron **OR** ondansetron (ZOFRAN) IV, polyethylene glycol  Antibiotics  :    Anti-infectives (From admission, onward)   Start     Dose/Rate Route Frequency Ordered Stop   05/06/17 2200  vancomycin (VANCOCIN) IVPB 750 mg/150 ml premix     750 mg 150 mL/hr over 60 Minutes Intravenous Every 48 hours 05/05/17 0527     05/05/17 2000  ceFEPIme (MAXIPIME) 1 g in dextrose 5 % 50 mL  IVPB     1 g 100 mL/hr over 30 Minutes Intravenous Every 24 hours 05/05/17 0528     05/04/17 2032  ceFEPIme (MAXIPIME) 1 g injection  Status:  Discontinued    Comments:  [name]   : cabinet override      05/04/17 2032 05/05/17 0059   05/04/17 1945  vancomycin (VANCOCIN) IVPB 1000 mg/200 mL premix     1,000 mg 200 mL/hr over 60 Minutes Intravenous  Once 05/04/17 1944 05/04/17 2224   05/04/17 1945  ceFEPIme (MAXIPIME) 1 g in dextrose 5 % 50 mL IVPB     1 g 100 mL/hr over 30 Minutes Intravenous  Once 05/04/17 1944 05/04/17 2123         Objective:   Vitals:   05/05/17 1100 05/05/17 1322 05/05/17 2137 05/06/17 0535  BP: (!) 142/81 (!) 120/59 (!) 156/73 (!) 142/70  Pulse: 82 82 86 82  Resp: 18 18 17 14   Temp: 98.7 F (37.1 C) 98.5 F (36.9 C) 99.1 F (37.3 C) 98.5 F (36.9 C)  TempSrc: Oral Oral Oral Oral  SpO2: 100% 100% 100% 98%  Weight:      Height:        Wt Readings from Last 3 Encounters:  05/04/17 57.6 kg (127 lb)  05/02/17 57.6 kg (127 lb)  04/08/17 54.9 kg (121 lb)     Intake/Output Summary (Last 24 hours) at 05/06/2017 1034 Last data filed at 05/06/2017 0948 Gross per 24 hour  Intake 1605 ml  Output 301 ml  Net 1304 ml     Physical Exam  Awake Alert, Oriented X 3, No new F.N deficits, Normal affect [place].AT,PERRAL Supple Neck,No  JVD, No cervical lymphadenopathy appriciated.  Symmetrical Chest wall movement, Good air movement bilaterally, CTAB RRR,No Gallops, Rubs or new Murmurs, No Parasternal Heave +ve B.Sounds, Abd Soft, No tenderness, No organomegaly appriciated, No rebound - guarding or rigidity. R foot trans metatarsal amputation and healing Ulcer, L foot doral-lateral large blistering Ulcer with cellulitis.    Data Review:    CBC Recent Labs  Lab 05/04/17 1848 05/05/17 0609 05/06/17 0813  WBC 18.9* 12.7* 12.1*  HGB 9.8* 8.4* 8.7*  HCT 29.8* 25.9* 26.9*  PLT 539* 463* 522*  MCV 81.2 83.5 82.3  MCH 26.7 27.1 26.6  MCHC 32.9 32.4 32.3   RDW 16.3* 16.8* 16.8*  LYMPHSABS 1.0  --   --   MONOABS 0.9  --   --   EOSABS 0.2  --   --   BASOSABS 0.0  --   --     Chemistries  Recent Labs  Lab 05/04/17 1848 05/05/17 0609 05/06/17 0813  NA 134* 132* 136  K 5.5* 4.5 4.4  CL 102 105 108  CO2 19* 19* 20*  GLUCOSE 243* 240* 105*  BUN 49* 45* 40*  CREATININE 2.66* 2.58* 2.16*  CALCIUM 8.3* 7.3* 7.4*  AST 34  --   --   ALT 41  --   --   ALKPHOS 300*  --   --   BILITOT 0.3  --   --    ------------------------------------------------------------------------------------------------------------------ No results for input(s): CHOL, HDL, LDLCALC, TRIG, CHOLHDL, LDLDIRECT in the last 72 hours.  Lab Results  Component Value Date   HGBA1C 10.6 04/08/2017   ------------------------------------------------------------------------------------------------------------------ No results for input(s): TSH, T4TOTAL, T3FREE, THYROIDAB in the last 72 hours.  Invalid input(s): FREET3 ------------------------------------------------------------------------------------------------------------------ Recent Labs    05/06/17 0813  RETICCTPCT 1.3    Coagulation profile No results for input(s): INR, PROTIME in the last 168 hours.  No results for input(s): DDIMER in the last 72 hours.  Cardiac Enzymes No results for input(s): CKMB, TROPONINI, MYOGLOBIN in the last 168 hours.  Invalid input(s): CK ------------------------------------------------------------------------------------------------------------------ No results found for: BNP  Micro Results Recent Results (from the past 240 hour(s))  Blood Culture (routine x 2)     Status: None (Preliminary result)   Collection Time: 05/04/17  6:00 PM  Result Value Ref Range Status   Specimen Description   Final    BLOOD LEFT HAND Performed at [facility], [place] [address] [place], [place] [address]    Special Requests   Final    IN PEDIATRIC BOTTLE Blood Culture adequate  volume Performed at [facility], [address] [place], [place] [address]    Culture PENDING  Incomplete   Report Status PENDING  Incomplete  Blood Culture (routine x 2)     Status: None (Preliminary result)   Collection Time: 05/04/17  6:50 PM  Result Value Ref Range Status   Specimen Description   Final    BLOOD LEFT ARM Performed at [facility], [place] [address], [place], [place] [address]    Special Requests   Final    BOTTLES DRAWN AEROBIC AND ANAEROBIC Blood Culture adequate volume Performed at [facility], [address] [place], [place] [address]    Culture PENDING  Incomplete   Report Status PENDING  Incomplete  MRSA PCR Screening     Status: None   Collection Time: 05/06/17  6:14 AM  Result Value Ref Range Status   MRSA by PCR NEGATIVE NEGATIVE Final  Comment:        The GeneXpert MRSA Assay (FDA approved for NASAL specimens only), is one component of a comprehensive MRSA colonization surveillance program. It is not intended to diagnose MRSA infection nor to guide or monitor treatment for MRSA infections. Performed at [facility], [place] [address], [place], [place] [address]     Radiology Reports Dg Chest 2 View  Result Date: 05/04/2017 CLINICAL DATA:  Patient reports fever of 103.2 last night with nausea and vomiting. EXAM: CHEST  2 VIEW COMPARISON:  09/29/2016 FINDINGS: The heart size and mediastinal contours are within normal limits. Both lungs are clear. Chronic right fourth rib fracture. No acute osseous appearing abnormality. IMPRESSION: No active cardiopulmonary disease. Electronically Signed   By: Ashley Royalty M.D.   On: 05/04/2017 18:38   Dg Foot Complete Left  Result Date: 05/04/2017 CLINICAL DATA:  New blister EXAM: LEFT FOOT - COMPLETE 3+ VIEW COMPARISON:  Numerous priors dating back through 10/15/2014. FINDINGS: Diffuse osteopenia of the left foot with slight rocker bottom appearance of the midfoot on  the lateral view. 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 Soft tissue swelling is seen along the lateral aspect of the forefoot with soft tissue emphysema at the base of the fourth toe. There is a soft tissue ulceration along the plantar lateral aspect of the midfoot. No loss of cortical margins to suggest osteomyelitis. Midfoot osteoarthritis is stable. Bony protuberance along the posteromedial aspect of the navicular is also stable. Generalized soft tissue swelling about the malleoli and included distal leg. No acute fracture or suspicious osseous lesions. IMPRESSION: 1. Soft tissue ulceration along the plantar lateral aspect of the midfoot with soft tissue emphysema along the plantar lateral aspect of the forefoot and extending along the fourth digit. 2. No radiographic manifestation of osteomyelitis. 3. 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 4. 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 Electronically Signed   By: Ashley Royalty M.D.   On: 05/04/2017 18:49    Time Spent in minutes  30   Lala Lund M.D on 05/06/2017 at 10:34 AM  Between 7am to 7pm - Pager - [phone] ( page via [email or web address], text pages only, please mention full 10 digit call back number). After 7pm go to [email or web address] - password [facility]

## 2017-05-07 ENCOUNTER — Encounter (HOSPITAL_COMMUNITY): Payer: Self-pay | Admitting: Podiatry

## 2017-05-07 DIAGNOSIS — B9689 Other specified bacterial agents as the cause of diseases classified elsewhere: Secondary | ICD-10-CM

## 2017-05-07 DIAGNOSIS — L089 Local infection of the skin and subcutaneous tissue, unspecified: Secondary | ICD-10-CM

## 2017-05-07 DIAGNOSIS — E11628 Type 2 diabetes mellitus with other skin complications: Secondary | ICD-10-CM

## 2017-05-07 LAB — BASIC METABOLIC PANEL
ANION GAP: 11 (ref 5–15)
BUN: 35 mg/dL — ABNORMAL HIGH (ref 6–20)
CALCIUM: 7.2 mg/dL — AB (ref 8.9–10.3)
CO2: 18 mmol/L — AB (ref 22–32)
CREATININE: 2.17 mg/dL — AB (ref 0.44–1.00)
Chloride: 107 mmol/L (ref 101–111)
GFR, EST AFRICAN AMERICAN: 30 mL/min — AB (ref >60)
GFR, EST NON AFRICAN AMERICAN: 26 mL/min — AB (ref >60)
Glucose, Bld: 226 mg/dL — ABNORMAL HIGH (ref 65–99)
Potassium: 5.3 mmol/L — ABNORMAL HIGH (ref 3.5–5.1)
SODIUM: 136 mmol/L (ref 135–145)

## 2017-05-07 LAB — GLUCOSE, CAPILLARY
GLUCOSE-CAPILLARY: 302 mg/dL — AB (ref 65–99)
GLUCOSE-CAPILLARY: 38 mg/dL — AB (ref 65–99)
GLUCOSE-CAPILLARY: 352 mg/dL — AB (ref 65–99)
Glucose-Capillary: 211 mg/dL — ABNORMAL HIGH (ref 65–99)
Glucose-Capillary: 36 mg/dL — CL (ref 65–99)
Glucose-Capillary: 46 mg/dL — ABNORMAL LOW (ref 65–99)
Glucose-Capillary: 89 mg/dL (ref 65–99)

## 2017-05-07 LAB — HEPATITIS C ANTIBODY: HCV Ab: <0.1 s/co ratio (ref 0.0–0.9)

## 2017-05-07 LAB — CBC
HEMATOCRIT: 27 % — AB (ref 36.0–46.0)
Hemoglobin: 8.4 g/dL — ABNORMAL LOW (ref 12.0–15.0)
MCH: 26.3 pg (ref 26.0–34.0)
MCHC: 31.1 g/dL (ref 30.0–36.0)
MCV: 84.4 fL (ref 78.0–100.0)
Platelets: 558 10*3/uL — ABNORMAL HIGH (ref 150–400)
RBC: 3.2 MIL/uL — ABNORMAL LOW (ref 3.87–5.11)
RDW: 16.7 % — AB (ref 11.5–15.5)
WBC: 15.2 10*3/uL — AB (ref 4.0–10.5)

## 2017-05-07 MED ORDER — INSULIN GLARGINE 100 UNIT/ML ~~LOC~~ SOLN
5.0000 [IU] | Freq: Every day | SUBCUTANEOUS | Status: DC
  Administered 2017-05-08: 5 [IU] via SUBCUTANEOUS
  Filled 2017-05-07 (×2): qty 0.05

## 2017-05-07 MED ORDER — GUAIFENESIN 100 MG/5ML PO SOLN
10.0000 mL | ORAL | Status: DC | PRN
Start: 2017-05-07 — End: 2017-05-10
  Administered 2017-05-07: 200 mg via ORAL
  Filled 2017-05-07: qty 10

## 2017-05-07 MED ORDER — VANCOMYCIN HCL IN DEXTROSE 1-5 GM/200ML-% IV SOLN: 1000.0000 mg | INTRAVENOUS | Status: DC

## 2017-05-07 MED ORDER — FUROSEMIDE 10 MG/ML IJ SOLN
20.0000 mg | Freq: Once | INTRAMUSCULAR | Status: AC
  Administered 2017-05-07: 20 mg via INTRAVENOUS
  Filled 2017-05-07: qty 2

## 2017-05-07 MED ORDER — INSULIN ASPART 100 UNIT/ML ~~LOC~~ SOLN
5.0000 [IU] | Freq: Once | SUBCUTANEOUS | Status: AC
  Administered 2017-05-07: 5 [IU] via SUBCUTANEOUS

## 2017-05-07 MED ORDER — INSULIN GLARGINE 100 UNIT/ML ~~LOC~~ SOLN
5.0000 [IU] | Freq: Two times a day (BID) | SUBCUTANEOUS | Status: DC
  Administered 2017-05-07: 5 [IU] via SUBCUTANEOUS
  Filled 2017-05-07 (×2): qty 0.05

## 2017-05-07 MED ORDER — LORATADINE 10 MG PO TABS
10.0000 mg | ORAL_TABLET | Freq: Every day | ORAL | Status: DC
  Administered 2017-05-07 – 2017-05-10 (×4): 10 mg via ORAL
  Filled 2017-05-07 (×4): qty 1

## 2017-05-07 MED ORDER — SODIUM POLYSTYRENE SULFONATE PO POWD
15.0000 g | Freq: Once | ORAL | Status: AC
  Administered 2017-05-07: 15 g via ORAL
  Filled 2017-05-07: qty 15

## 2017-05-07 MED ORDER — INSULIN ASPART 100 UNIT/ML ~~LOC~~ SOLN
0.0000 [IU] | Freq: Three times a day (TID) | SUBCUTANEOUS | Status: DC
  Administered 2017-05-08: 1 [IU] via SUBCUTANEOUS
  Administered 2017-05-08: 0 [IU] via SUBCUTANEOUS
  Administered 2017-05-08: 6 [IU] via SUBCUTANEOUS
  Administered 2017-05-09 – 2017-05-10 (×2): 5 [IU] via SUBCUTANEOUS
  Administered 2017-05-10: 1 [IU] via SUBCUTANEOUS

## 2017-05-07 NOTE — Progress Notes (Signed)
Patient is refusing kayexalate at this time. She stated she will drink it later.

## 2017-05-07 NOTE — Anesthesia Postprocedure Evaluation (Signed)
Anesthesia Post Note  Patient: [name]  Procedure(s) Performed: IRRIGATION AND DEBRIDEMENT ABSCESS LEFT FOOT (Left )     Patient location during evaluation: PACU Anesthesia Type: General Level of consciousness: awake and alert Pain management: pain level controlled Vital Signs Assessment: post-procedure vital signs reviewed and stable Respiratory status: spontaneous breathing, nonlabored ventilation, respiratory function stable and patient connected to nasal cannula oxygen Cardiovascular status: blood pressure returned to baseline and stable Postop Assessment: no apparent nausea or vomiting Anesthetic complications: no    Last Vitals:  Vitals:   05/06/17 2216 05/07/17 0358  BP: (!) 162/79 139/67  Pulse: 77 63  Resp: 18 20  Temp: 36.8 C 37.5 C  SpO2: 100% 96%    Last Pain:  Vitals:   05/07/17 0830  TempSrc:   PainSc: [place]

## 2017-05-07 NOTE — Progress Notes (Signed)
Hypoglycemic Event  CBG: 38  Treatment: carb snack  Symptoms: none  Follow-up CBG: Time: 1635 CBG Result:46  Possible Reasons for Event:   Comments/MD notified: yes    Lezlie Octave

## 2017-05-07 NOTE — Progress Notes (Signed)
Pharmacy Antibiotic Note  [name] is a 47 y.o. female admitted on 05/04/2017 with Wound infection.  Pharmacy has been consulted for Vancomycin, cefepime dosing on 2/2  Plan: Day 3 Abx's 1) With improving Scr, will adjust vancomycin from 750mg  IV q48 to 1g IV q48 (goal AUC 400-500) 2) Continue cefepime 1g IV q24 for CrCl 11-29 ml/min   Height: 5\' 7"  (170.2 cm) Weight: 127 lb (57.6 kg) IBW/kg (Calculated) : 61.6  Temp (24hrs), Avg:98.6 F (37 C), Min:98.3 F (36.8 C), Max:99.5 F (37.5 C)  Recent Labs  Lab 05/04/17 1848 05/04/17 1902 05/05/17 0609 05/06/17 0813 05/07/17 0650  WBC 18.9*  --  12.7* 12.1* 15.2*  CREATININE 2.66*  --  2.58* 2.16* 2.17*  LATICACIDVEN  --  1.76  --   --   --     Estimated Creatinine Clearance: 29.8 mL/min (A) (by C-G formula based on SCr of 2.17 mg/dL (H)).    Allergies  Allergen Reactions  . Cleocin [Clindamycin Hcl] Diarrhea    Diarrhea   . Lisinopril Other (See Comments)    Elevated potassium per pt report  . Amoxicillin Diarrhea and Nausea Only    Has patient had a PCN reaction causing immediate rash, facial/tongue/throat swelling, SOB or lightheadedness with hypotension: No Has patient had a PCN reaction causing severe rash involving mucus membranes or skin necrosis: No Has patient had a PCN reaction that required hospitalization: No Has patient had a PCN reaction occurring within the last 10 years: No If all of the above answers are "NO", then may proceed with Cephalosporin use.   . Bactrim [Sulfamethoxazole-Trimethoprim] Diarrhea and Nausea Only  . Ciprofloxacin Itching    Antimicrobials this admission: Vancomycin 05/04/2017 >> Cefepime 05/04/2017 >>   Microbiology results: 2/2 blood: ngtd 2/4 MRSA PCR: negative 2/4 abscess left foot: pending   Thank you for allowing pharmacy to be a part of this patient's care.  Adrian Saran, PharmD, BCPS 05/07/2017 12:02 PM

## 2017-05-07 NOTE — Progress Notes (Signed)
Patient refused kayexalate at this time. Patient again stated she will drink it later.

## 2017-05-07 NOTE — Progress Notes (Signed)
OT Cancellation Note  Patient Details Name: [name] MRN: [id number] DOB: 1970-08-29   Cancelled Treatment:    Reason Eval/Treat Not Completed: Patient declined, no reason specified  Pt wanted to sleep. Will check back next day   Kari Baars, [place] B[place]y Pries 05/07/2017, 1:28 PM

## 2017-05-07 NOTE — Evaluation (Addendum)
Physical Therapy Evaluation Patient Details Name: [name] MRN: [id number] DOB: Aug 15, 1970 Today's Date: 05/07/2017   History of Present Illness  47 y.o. female admitted with gangrenous wound L foot, s/p I&D 05/06/17. Pt reports she has a wound on R foot as well.  PMH of R transmetatarsal amputation, poorly controlled DM, HTN, DVT.   Clinical Impression  Pt admitted with above diagnosis. Pt currently with functional limitations due to the deficits listed below (see PT Problem List). Pt is independently able to pivot on RLE to bedside commode. Did not attempt ambulation as weightbearing status has not yet been clarified. Pt is concerned about too much weightbearing on RLE due to a wound there also. RN to request WB status from surgeon.  Instructed pt in BLE exercises to be done independently for strengthening.  Pt will benefit from skilled PT to increase their independence and safety with mobility to allow discharge to the venue listed below.       Follow Up Recommendations Other (comment)(TBD -depending on progress/medical plan (pt may have amputation)) Pt has a WC with elevating leg rests at home, home is WC accessible.  She has a roommate that would assist with shopping and other needs. At present level, pt could DC home with WC. DC plan may change if she has further surgery.    Equipment Recommendations  None recommended by PT    Recommendations for Other Services       Precautions / Restrictions Precautions Precautions: Fall Restrictions Other Position/Activity Restrictions: WB status not in orders, will request MD clarification, pt stated she also has a wound on R foot that she doesn't want to put much weight on      Mobility  Bed Mobility Overal bed mobility: Modified Independent             General bed mobility comments: independent with rail  Transfers Overall transfer level: Modified independent               General transfer comment: pt has been  independently pivoting to/from bedside commode, WBing on RLE only  Ambulation/Gait             General Gait Details: unable -WB status not clarified, pt doesn't want to WB on R foot very much due to pre-existing wound there, she feels hopping would be too much WB on that wound  Stairs            Wheelchair Mobility    Modified Rankin (Stroke Patients Only)       Balance   Sitting-balance support: No upper extremity supported;Feet unsupported Sitting balance-Leahy Scale: Good                                       Pertinent Vitals/Pain Pain Assessment: 0-10 Pain Score: 4  Pain Location: L foot when in dependent position Pain Descriptors / Indicators: Sore Pain Intervention(s): Limited activity within patient's tolerance;Monitored during session;Premedicated before session    Home Living Family/patient expects to be discharged to:: Private residence Living Arrangements: Non-relatives/Friends(3 roommates) Available Help at Discharge: Friend(s);Available PRN/intermittently;Family Type of Home: House Home Access: Ramped entrance     Home Layout: Two level;Able to live on main level with bedroom/bathroom Home Equipment: [name] - 2 wheels;Walker - 4 wheels;Wheelchair - [employer];Tub bench;Crutches      Prior Function Level of Independence: Independent         Comments: worked out  at gym frequently PTA     Hand Dominance        Extremity/Trunk Assessment        Lower Extremity Assessment Lower Extremity Assessment: RLE deficits/detail;LLE deficits/detail RLE Deficits / Details: sensation to light touch absent B feet, L shin also numb, B knee ext +4/5, 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 so she has very limited ankle PF/DF AROM RLE Sensation: history of peripheral neuropathy;decreased light touch LLE Deficits / Details: transmet amputation, can actively PF/DF ankle LLE Sensation: history of peripheral neuropathy;decreased  light touch       Communication   Communication: No difficulties  Cognition Arousal/Alertness: Awake/alert Behavior During Therapy: WFL for tasks assessed/performed Overall Cognitive Status: Within Functional Limits for tasks assessed                                        General Comments      Exercises General Exercises - Lower Extremity Ankle Circles/Pumps: AROM;Right;10 reps Quad Sets: AROM;Both;5 reps Gluteal Sets: AROM;5 reps;Both Long Arc Quad: AROM;Both;10 reps;Seated   Assessment/Plan    PT Assessment Patient needs continued PT services  PT Problem List Decreased mobility;Decreased activity tolerance;Pain       PT Treatment Interventions Functional mobility training;Therapeutic activities;Therapeutic exercise    PT Goals (Current goals can be found in the Care Plan section)  Acute Rehab PT Goals Patient Stated Goal: return home, would like to be able to work out again PT Goal Formulation: With patient Time For Goal Achievement: 05/21/17 Potential to Achieve Goals: Good    Frequency Min 3X/week   Barriers to discharge        Co-evaluation               AM-PAC PT "6 Clicks" Daily Activity  Outcome Measure Difficulty turning over in bed (including adjusting bedclothes, sheets and blankets)?: None Difficulty moving from lying on back to sitting on the side of the bed? : None Difficulty sitting down on and standing up from a chair with arms (e.g., wheelchair, bedside commode, etc,.)?: A Little Help needed moving to and from a bed to chair (including a wheelchair)?: None Help needed walking in hospital room?: Total Help needed climbing 3-5 steps with a railing? : Total 6 Click Score: 17    End of Session   Activity Tolerance: Patient limited by pain Patient left: in bed;with call bell/phone within reach Nurse Communication: Mobility status PT Visit Diagnosis: Difficulty in walking, not elsewhere classified (R26.2);Pain Pain -  Right/Left: Left Pain - part of body: Ankle and joints of foot    Time: 1101-1120 PT Time Calculation (min) (ACUTE ONLY): 19 min   Charges:   PT Evaluation $PT Eval Low Complexity: 1 Low     PT G Codes:          Philomena Doheny 05/07/2017, 11:41 AM [phone]

## 2017-05-07 NOTE — Progress Notes (Signed)
Hypoglycemic Event  CBG: 46  Treatment: carb snack  Symptoms: none  Follow-up CBG: Time:1651 CBG Result:36  Possible Reasons for Event:   Comments/MD notified:yes    [name]

## 2017-05-07 NOTE — Progress Notes (Signed)
@IPLOG @        PROGRESS NOTE                                                                                                                                                                                                             Patient Demographics:    [name], is a 46 y.o. female, DOB - 10-Apr-1970, POE:[id number]  Admit date - 05/04/2017   Admitting Physician Jani Gravel, MD  Outpatient Primary MD for the patient is Patient, No Pcp Per  LOS - 3  Chief Complaint  Patient presents with  . 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   Subjective:   Patient in bed, appears comfortable, denies any headache, no fever, no chest pain or pressure, no shortness of breath , no abdominal pain. No focal weakness.    Assessment  & Plan :     1.  Left diabetic foot ulcer and cellulitis with sepsis - recently finished antibiotics for right foot infection and ulcer few weeks ago, MRI noted with ? Osteo around the 4th metatarsal, cultures so far unrevealing, for now continue empiric IV vancomycin cefepime, sepsis physiology has resolved patient appears nontoxic, continue supportive care, consulted podiatry and ID she is status post incision and drainage by [name] podiatrist on 05/06/2017, await his follow-up and comment on osteomyelitis.  I need to provide opinion on MRI findings as well.  2.  Poorly controlled DM type I.  Recent A1c greater than 10.5, currently on Lantus and sliding scale , Xilin dose further adjusted on 05/07/2017 note she is an extremely brittle diabetic and easily drops below 70, be cautious with titrating  insulin.  Lab Results  Component Value Date   HGBA1C 10.6 04/08/2017   CBG (last 3)  Recent Labs    05/06/17 2148 05/07/17 0742 05/07/17 1119  GLUCAP 105* 211* 302*     3.  Dyslipidemia.  Continue statin.  4.  Hypertension.  On Norvasc continue to monitor.  5.  CKD 5  - Baseline creatinine close to 1.7, she is close to baseline.  Monitor.     Diet : Diet Carb Modified Fluid consistency: Thin; Room service appropriate? Yes  Family Communication  :  None  Code Status :  Full  Disposition Plan  :  TBD  Consults  : Podiatry, ID  Procedures  :    Incision and drainage of left foot blister/abscess by [name] podiatrist on 05/06/2017.  MRI of the left foot - 1. Abscess adjacent to the fourth metatarsal head and neck. Suspect small abscess also surrounding the fourth flexor tendon. 2. Inflammatory phlegmon and gas in the soft tissues along the lateral aspect of the fourth phalanx without discrete drainable abscess. 3. Findings worrisome for osteomyelitis involving the fourth proximal and middle phalanges and also the third and fourth metatarsals. 4. Large skin blister on the plantar aspect of the foot with adjacent gas in the soft tissues. 5. Cellulitis and myofasciitis.  DVT Prophylaxis  :  Lovenox    Lab Results  Component Value Date   PLT 558 (H) 05/07/2017    Inpatient Medications  Scheduled Meds: . acidophilus  1 capsule Oral Daily  . amLODipine  2.5 mg Oral Daily  . aspirin  325 mg Oral Daily  . brimonidine  1 drop Right Eye BID  . calcitRIOL  0.5 mcg Oral Daily  . cyclopentolate  1 drop Right Eye TID  . enoxaparin (LOVENOX) injection  30 mg Subcutaneous QHS  . folic acid  1 mg Oral Daily  . insulin aspart  0-9 Units Subcutaneous TID WC  . insulin glargine  5 Units Subcutaneous BID  . ketorolac  1 drop Right Eye QID  . loratadine  10 mg Oral Daily  . pravastatin  40 mg Oral Daily  . prednisoLONE acetate  1 drop Right Eye QID  . sodium  chloride flush  3 mL Intravenous Q12H  . sodium polystyrene  15 g Oral Once  . trimethoprim-polymyxin b  1 drop Both Eyes QID   Continuous Infusions: . sodium chloride    . ceFEPime (MAXIPIME) IV 0 g (05/05/17 2009)  . [START ON 05/08/2017] vancomycin     PRN Meds:.sodium chloride, acetaminophen **OR** acetaminophen, guaiFENesin, HYDROcodone-acetaminophen, morphine injection, ondansetron **OR** ondansetron (ZOFRAN) IV, oxyCODONE-acetaminophen, polyethylene glycol, promethazine, sodium chloride flush  Antibiotics  :    Anti-infectives (From admission, onward)   Start     Dose/Rate Route Frequency Ordered Stop   05/08/17 1800  vancomycin (VANCOCIN) IVPB 1000 mg/200 mL premix     1,000 mg 200 mL/hr over 60 Minutes Intravenous Every 48 hours 05/07/17 1207     05/06/17 2200  vancomycin (VANCOCIN) IVPB 750 mg/150 ml premix  Status:  Discontinued     750 mg 150 mL/hr over 60 Minutes Intravenous Every 48 hours 05/05/17 0527 05/07/17 1207   05/05/17 2000  ceFEPIme (MAXIPIME) 1 g in dextrose 5 % 50 mL IVPB     1 g 100 mL/hr over 30 Minutes Intravenous Every 24 hours 05/05/17 0528     05/04/17 2032  ceFEPIme (MAXIPIME) 1 g injection  Status:  Discontinued    Comments:  [name]   : cabinet override      05/04/17 2032 05/05/17 0059   05/04/17 1945  vancomycin (VANCOCIN) IVPB 1000 mg/200 mL premix     1,000 mg 200 mL/hr over 60 Minutes Intravenous  Once 05/04/17 1944 05/04/17 2224   05/04/17 1945  ceFEPIme (MAXIPIME) 1 g in dextrose 5 % 50 mL IVPB     1 g 100 mL/hr over 30 Minutes Intravenous  Once 05/04/17 1944 05/04/17 2123         Objective:   Vitals:  05/06/17 2145 05/06/17 2200 05/06/17 2216 05/07/17 0358  BP: (!) 153/84 (!) 149/81 (!) 162/79 139/67  Pulse: 79 75 77 63  Resp: 15 19 18 20   Temp:  98.5 F (36.9 C) 98.3 F (36.8 C) 99.5 F (37.5 C)  TempSrc:    Oral  SpO2: 100% 98% 100% 96%  Weight:      Height:        Wt Readings from Last 3 Encounters:  05/06/17  57.6 kg (127 lb)  05/02/17 57.6 kg (127 lb)  04/08/17 54.9 kg (121 lb)     Intake/Output Summary (Last 24 hours) at 05/07/2017 1234 Last data filed at 05/06/2017 2135 Gross per 24 hour  Intake 892.5 ml  Output 5 ml  Net 887.5 ml     Physical Exam  Awake Alert, Oriented X 3, No new F.N deficits, Normal affect [place].AT,PERRAL Supple Neck,No JVD, No cervical lymphadenopathy appriciated.  Symmetrical Chest wall movement, Good air movement bilaterally, CTAB RRR,No Gallops, Rubs or new Murmurs, No Parasternal Heave +ve B.Sounds, Abd Soft, No tenderness, No organomegaly appriciated, No rebound - guarding or rigidity. R foot trans metatarsal amputation healing ulcer on the bandage, left foot blister has been incision and drained currently under bandage no surrounding cellulitis.    Data Review:    CBC Recent Labs  Lab 05/04/17 1848 05/05/17 0609 05/06/17 0813 05/07/17 0650  WBC 18.9* 12.7* 12.1* 15.2*  HGB 9.8* 8.4* 8.7* 8.4*  HCT 29.8* 25.9* 26.9* 27.0*  PLT 539* 463* 522* 558*  MCV 81.2 83.5 82.3 84.4  MCH 26.7 27.1 26.6 26.3  MCHC 32.9 32.4 32.3 31.1  RDW 16.3* 16.8* 16.8* 16.7*  LYMPHSABS 1.0  --   --   --   MONOABS 0.9  --   --   --   EOSABS 0.2  --   --   --   BASOSABS 0.0  --   --   --     Chemistries  Recent Labs  Lab 05/04/17 1848 05/05/17 0609 05/06/17 0813 05/07/17 0650  NA 134* 132* 136 136  K 5.5* 4.5 4.4 5.3*  CL 102 105 108 107  CO2 19* 19* 20* 18*  GLUCOSE 243* 240* 105* 226*  BUN 49* 45* 40* 35*  CREATININE 2.66* 2.58* 2.16* 2.17*  CALCIUM 8.3* 7.3* 7.4* 7.2*  AST 34  --   --   --   ALT 41  --   --   --   ALKPHOS 300*  --   --   --   BILITOT 0.3  --   --   --    ------------------------------------------------------------------------------------------------------------------ No results for input(s): CHOL, HDL, LDLCALC, TRIG, CHOLHDL, LDLDIRECT in the last 72 hours.  Lab Results  Component Value Date   HGBA1C 10.6 04/08/2017    ------------------------------------------------------------------------------------------------------------------ No results for input(s): TSH, T4TOTAL, T3FREE, THYROIDAB in the last 72 hours.  Invalid input(s): FREET3 ------------------------------------------------------------------------------------------------------------------ Recent Labs    05/06/17 0813  VITAMINB12 457  FOLATE 3.7*  FERRITIN 67  TIBC 263  IRON 9*  RETICCTPCT 1.3    Coagulation profile No results for input(s): INR, PROTIME in the last 168 hours.  No results for input(s): DDIMER in the last 72 hours.  Cardiac Enzymes No results for input(s): CKMB, TROPONINI, MYOGLOBIN in the last 168 hours.  Invalid input(s): CK ------------------------------------------------------------------------------------------------------------------ No results found for: BNP  Micro Results Recent Results (from the past 240 hour(s))  Blood Culture (routine x 2)     Status: None (Preliminary result)   Collection  Time: 05/04/17  6:00 PM  Result Value Ref Range Status   Specimen Description   Final    BLOOD LEFT HAND Performed at [facility], [place] [address] [place], [place] [address]    Special Requests   Final    IN PEDIATRIC BOTTLE Blood Culture adequate volume Performed at [facility], [address] [place], [place] [address]    Culture   Final    NO GROWTH 1 DAY Performed at [facility], [place] [address] [place], [place] [address]    Report Status PENDING  Incomplete  Blood Culture (routine x 2)     Status: None (Preliminary result)   Collection Time: 05/04/17  6:50 PM  Result Value Ref Range Status   Specimen Description   Final    BLOOD LEFT ARM Performed at [facility], [place] [address], [place], [place] [address]    Special Requests   Final    BOTTLES DRAWN AEROBIC AND ANAEROBIC Blood Culture adequate volume Performed at [facility], [address] [place], [place] [address]    Culture   Final    NO GROWTH 1 DAY Performed at [facility], [place] [address], [place], [place] [address]    Report Status PENDING  Incomplete  MRSA PCR Screening     Status: None   Collection Time: 05/06/17  6:14 AM  Result Value Ref Range Status   MRSA by PCR NEGATIVE NEGATIVE Final    Comment:        The GeneXpert MRSA Assay (FDA approved for NASAL specimens only), is one component of a comprehensive MRSA colonization surveillance program. It is not intended to diagnose MRSA infection nor to guide or monitor treatment for MRSA infections. Performed at [facility], [place] [address], [place], [place] [address]   Aerobic/Anaerobic Culture (surgical/deep wound)     Status: None (Preliminary result)   Collection Time: 05/06/17  9:03 PM  Result Value Ref Range Status   Specimen Description   Final    ABSCESS LEFT FOOT Performed at [place] [address], [place], [place] [address]    Special Requests   Final    NONE Performed at [facility], [place] [address] [place], [place] [address]    Gram Stain   Final    ABUNDANT WBC PRESENT,BOTH PMN AND MONONUCLEAR MODERATE GRAM POSITIVE COCCI FEW GRAM NEGATIVE RODS Performed at [facility], [place] [address], [place],  [address]    Culture PENDING  Incomplete   Report Status PENDING  Incomplete    Radiology Reports Dg Chest 2 View  Result Date: 05/04/2017 CLINICAL DATA:  Patient reports fever of 103.2 last night with nausea and vomiting. EXAM: CHEST  2 VIEW COMPARISON:  09/29/2016 FINDINGS: The heart size and mediastinal contours are within normal limits. Both lungs are clear. Chronic right fourth rib fracture. No acute osseous appearing abnormality. IMPRESSION: No active cardiopulmonary disease. Electronically Signed   By: Ashley Royalty M.D.   On: 05/04/2017 18:38   Mr Foot Left Wo Contrast  Result Date: 05/06/2017 CLINICAL DATA:  Large  hemorrhagic blister on the plantar aspect of the foot. Abnormal x-rays EXAM: MRI OF THE LEFT FOOT WITHOUT CONTRAST TECHNIQUE: Multiplanar, multisequence MR imaging of the left foot was performed. No intravenous contrast was administered. COMPARISON:  Radiographs 05/04/2017 FINDINGS: Abnormal T1 and T2 signal intensity in the mid and proximal phalanges of  the fourth digit along with mild edema like signal abnormality in the third and fourth metatarsals. Findings worrisome for osteomyelitis. The fifth ray has been resected. There is fluid/phlegmon and gas in the soft tissues adjacent to the fourth phalanx. There is also the fluid collection adjacent to the fourth metatarsal head and neck which measures 18.4 x 15.0 x 11.5 mm and is most consistent with an abscess. On the plantar aspect of the foot is a large skin blister. Along its distal aspect there appears to be an open wound. There is significant gas in the plantar soft tissues. There is also signal abnormality surrounding the flexor tendon of the fourth toe with a probable small abscess and gas. Diffuse cellulitis and myofasciitis. IMPRESSION: 1. Abscess adjacent to the fourth metatarsal head and neck. Suspect small abscess also surrounding the fourth flexor tendon. 2. Inflammatory phlegmon and gas in the soft tissues along the lateral aspect of the fourth phalanx without discrete drainable abscess. 3. Findings worrisome for osteomyelitis involving the fourth proximal and middle phalanges and also the third and fourth metatarsals. 4. Large skin blister on the plantar aspect of the foot with adjacent gas in the soft tissues. 5. Cellulitis and myofasciitis. Electronically Signed   By: Marijo Sanes M.D.   On: 05/06/2017 15:17   Dg Foot Complete Left  Result Date: 05/04/2017 CLINICAL DATA:  New blister EXAM: LEFT FOOT - COMPLETE 3+ VIEW COMPARISON:  Numerous priors dating back through 10/15/2014. FINDINGS: Diffuse osteopenia of the left foot with slight rocker  bottom appearance of the midfoot on the lateral view. 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 Soft tissue swelling is seen along the lateral aspect of the forefoot with soft tissue emphysema at the base of the fourth toe. There is a soft tissue ulceration along the plantar lateral aspect of the midfoot. No loss of cortical margins to suggest osteomyelitis. Midfoot osteoarthritis is stable. Bony protuberance along the posteromedial aspect of the navicular is also stable. Generalized soft tissue swelling about the malleoli and included distal leg. No acute fracture or suspicious osseous lesions. IMPRESSION: 1. Soft tissue ulceration along the plantar lateral aspect of the midfoot with soft tissue emphysema along the plantar lateral aspect of the forefoot and extending along the fourth digit. 2. No radiographic manifestation of osteomyelitis. 3. 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 4. 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 Electronically Signed   By: Ashley Royalty M.D.   On: 05/04/2017 18:49    Time Spent in minutes  30   Lala Lund M.D on 05/07/2017 at 12:34 PM  Between 7am to 7pm - Pager - [phone] ( page via [email or web address], text pages only, please mention full 10 digit call back number). After 7pm go to [email or web address] - password [email or web address]

## 2017-05-07 NOTE — Progress Notes (Signed)
Patient ID: RAIA AMICO, female   DOB: 1971/01/06, 47 y.o.   MRN: [id number]         [facility]  Date of Admission:  05/04/2017           Day 3 vancomycin        Day 3 cefepime ASSESSMENT: She has a severe, deep left foot infection with polymicrobial flora as expected.  I will continue current antibiotics pending final cultures and surgical evaluation.  PLAN: 1. Continue current antibiotics pending final cultures 2. 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 acidophilus  1 capsule Oral Daily  . amLODipine  2.5 mg Oral Daily  . aspirin  325 mg Oral Daily  . brimonidine  1 drop Right Eye BID  . calcitRIOL  0.5 mcg Oral Daily  . cyclopentolate  1 drop Right Eye TID  . enoxaparin (LOVENOX) injection  30 mg Subcutaneous QHS  . folic acid  1 mg Oral Daily  . insulin aspart  0-9 Units Subcutaneous TID WC  . insulin glargine  5 Units Subcutaneous BID  . ketorolac  1 drop Right Eye QID  . loratadine  10 mg Oral Daily  . pravastatin  40 mg Oral Daily  . prednisoLONE acetate  1 drop Right Eye QID  . sodium chloride flush  3 mL Intravenous Q12H  . sodium polystyrene  15 g Oral Once  . trimethoprim-polymyxin b  1 drop Both Eyes QID   Continuous Infusions: . sodium chloride    . ceFEPime (MAXIPIME) IV 0 g (05/05/17 2009)  . [START ON 05/08/2017] vancomycin     PRN Meds:.sodium chloride, acetaminophen **OR** acetaminophen, guaiFENesin, HYDROcodone-acetaminophen, morphine injection, ondansetron **OR** ondansetron (ZOFRAN) IV, oxyCODONE-acetaminophen, polyethylene glycol, promethazine, sodium  chloride flush   SUBJECTIVE: Her left foot MRI revealed extensive abscess with gas in the tissue and probable osteomyelitis.  Operative findings confirmed extensive deep, necrotizing infection along the entire plantar surface of her foot.  [name] has recommended consideration of below-knee amputation.  Operative Gram stain shows gram-positive cocci and gram are pending.  [place] is in surprisingly good spirits.  Review of Systems: Review of Systems  Constitutional: Positive for chills and fever. Negative for diaphoresis.    Allergies  Allergen Reactions  . Cleocin [Clindamycin Hcl] Diarrhea    Diarrhea   . Lisinopril Other (See Comments)    Elevated potassium per pt report  . Amoxicillin Diarrhea and Nausea Only    Has patient had a PCN reaction causing immediate rash, facial/tongue/throat swelling, SOB or lightheadedness with hypotension: No Has patient had a PCN reaction causing severe rash involving mucus membranes or skin necrosis: No Has patient had a PCN reaction that required hospitalization: No Has patient had a PCN reaction occurring within the last 10 years: No If all of the above answers are "NO", then may proceed with Cephalosporin use.   . Bactrim [Sulfamethoxazole-Trimethoprim] Diarrhea and Nausea Only  . Ciprofloxacin Itching    OBJECTIVE: Vitals:   05/06/17 2200 05/06/17 2216 05/07/17 0358 05/07/17 1004  BP: (!) 149/81 (!) 162/79 139/67 (!) 148/70  Pulse: 75  77 63 68  Resp: 19 18 20 18   Temp: 98.5 F (36.9 C) 98.3 F (36.8 C) 99.5 F (37.5 C) 99 F (37.2 C)  TempSrc:   Oral Oral  SpO2: 98% 100% 96% 98%  Weight:      Height:       Body mass index is 19.89 kg/m.  Physical Exam  Constitutional:  She is smiling and in good spirits.  She is sitting up on the side of her bed.  Musculoskeletal:  Her foot is dressed.    Lab Results Lab Results  Component Value Date   WBC 15.2 (H) 05/07/2017   HGB 8.4 (L) 05/07/2017   HCT 27.0 (L) 05/07/2017   MCV  84.4 05/07/2017   PLT 558 (H) 05/07/2017    Lab Results  Component Value Date   CREATININE 2.17 (H) 05/07/2017   BUN 35 (H) 05/07/2017   NA 136 05/07/2017   K 5.3 (H) 05/07/2017   CL 107 05/07/2017   CO2 18 (L) 05/07/2017    Lab Results  Component Value Date   ALT 41 05/04/2017   AST 34 05/04/2017   ALKPHOS 300 (H) 05/04/2017   BILITOT 0.3 05/04/2017     Microbiology: Recent Results (from the past 240 hour(s))  Blood Culture (routine x 2)     Status: None (Preliminary result)   Collection Time: 05/04/17  6:00 PM  Result Value Ref Range Status   Specimen Description   Final    BLOOD LEFT HAND Performed at [facility], [place] [address] [place], [place] [address]    Special Requests   Final    IN PEDIATRIC BOTTLE Blood Culture adequate volume Performed at [facility], [place] [place], [place] [address]    Culture   Final    NO GROWTH 2 DAYS Performed at [facility], [place] [address] [place], [place] [address]    Report Status PENDING  Incomplete  Blood Culture (routine x 2)     Status: None (Preliminary result)   Collection Time: 05/04/17  6:50 PM  Result Value Ref Range Status   Specimen Description   Final    BLOOD LEFT ARM Performed at [facility], [place] [address], [place], [place] [address]    Special Requests   Final    BOTTLES DRAWN AEROBIC AND ANAEROBIC Blood Culture adequate volume Performed at [facility], [place] [place], [place] [address]    Culture   Final    NO GROWTH 2 DAYS Performed at [facility], [place] [address], [place], [place] [address]    Report Status PENDING  Incomplete  MRSA PCR Screening     Status: None   Collection Time: 05/06/17  6:14 AM  Result Value Ref Range Status   MRSA by PCR NEGATIVE NEGATIVE Final    Comment:        The GeneXpert MRSA Assay (FDA approved for NASAL specimens only), is one component of a comprehensive MRSA colonization surveillance program. It  is not intended to diagnose MRSA infection nor to guide or monitor treatment for MRSA infections. Performed at [facility], [place] [address], [place], [place] [address]   Aerobic/Anaerobic Culture (surgical/deep wound)     Status: None (Preliminary result)   Collection Time: 05/06/17  9:03 PM  Result Value Ref Range Status   Specimen Description   Final    ABSCESS LEFT FOOT Performed at [facility], [place] [address], [place], [place] [address]    Special Requests   Final    NONE Performed at [facility], [place] [address] [place], [place] [address]    Gram Stain   Final    ABUNDANT WBC PRESENT,BOTH PMN AND MONONUCLEAR MODERATE GRAM POSITIVE COCCI FEW GRAM NEGATIVE RODS Performed at [facility], [place] [address] [place], [place] [address]    Culture PENDING  Incomplete   Report Status PENDING  Incomplete    Michel Bickers, MD [facility] [phone] pager   [phone] cell 05/07/2017, 3:16 PM

## 2017-05-07 NOTE — Progress Notes (Signed)
Patient encouraged by MD and RN to get out of bed and sit in chair. Patient refusing to get out of bed and into chair.  Patient given incentive spirometry and shown how to use.  Patient returned demonstration.

## 2017-05-07 NOTE — Care Management Important Message (Signed)
Important Message  Patient Details  Name: [name] MRN: [id number] Date of Birth: February 29, 1971   Medicare Important Message Given:  Yes    Kerin Salen 05/07/2017, 10:55 AMImportant Message  Patient Details  Name: [name] MRN: [id number] Date of Birth: 02-28-1971   Medicare Important Message Given:  Yes    Kerin Salen 05/07/2017, 10:55 AM

## 2017-05-07 NOTE — Progress Notes (Signed)
   HPI: 47 year old female with a history of uncontrolled diabetes mellitus status post incision and drainage left foot abscess on 05/06/2017 presents today follow-up treatment and evaluation.  Today I had a very detailed discussion with the patient regarding the need for likely below-knee amputation.  Patient is status post incision and drainage of the left foot and there was an extensive amount of necrotic purulent drainage with gas gangrene to the soft tissues of the foot.  MRI is consistent with osteomyelitis.    Past Medical History:  Diagnosis Date  . Anemia   . C. difficile diarrhea 09/26/2014  . Cataracts, both eyes   . Cellulitis of right foot 06/02/2014  . CKD (chronic kidney disease) stage 3, GFR 30-59 ml/min (HCC)    sees Dr Justin Mend- Stage IV  . Diabetic ulcer of right foot ([place]) 06/02/2014  . DVT (deep venous thrombosis) ([place]) 10/2014   one in each one in leg- PICC line   . H/O seasonal allergies   . Heart murmur    told once when she was pregnant- no furter mention- was in notes 11/2015- had Echo 8 /4/17  . History of blood transfusion   . Hypercholesteremia   . Hypertension   . Left foot infection   . Neuropathy    feet  . Neuropathy in diabetes ([place])   . Peripheral vascular disease ([place])   . Staphylococcus aureus bacteremia 10/2014  . Type 1 diabetes ([place])    onset age 82     Physical Exam: General: The patient is alert and oriented x3 in no acute distress. Dressings are intact.  No strikethrough noted.  No malodor noted.  Patient appears to be in good spirits.  Assessment: -Status post incision and drainage gas gangrene left foot -Osteomyelitis left foot   Plan of Care:  -Patient evaluated today.  Today we had a very lengthy and detailed discussion regarding the benefits and disadvantages of below-knee amputation.  Patient understands that a below-knee amputation would be in her best interest due to the extensive amount of necrotic tissue and acute nature of the  infection.  Patient understands and agrees.  Recommended referral earlier today with the hospitalist for below-knee amputation and consultation for vascular or orthopedics.  Podiatry will sign off now at this time.   Edrick Kins, DPM [facility]  Dr. Edrick Kins, DPM    [address], [place] [address]                Office [phone]  Fax [phone]

## 2017-05-07 NOTE — Progress Notes (Signed)
Hypoglycemic Event  CBG: 36  Treatment: carb snack  Symptoms: none  Follow-up CBG: Time:1746 CBG Result:89  Possible Reasons for Event:   Comments/MD notified:yes    [name]

## 2017-05-08 DIAGNOSIS — M869 Osteomyelitis, unspecified: Secondary | ICD-10-CM

## 2017-05-08 DIAGNOSIS — I96 Gangrene, not elsewhere classified: Secondary | ICD-10-CM | POA: Diagnosis present

## 2017-05-08 DIAGNOSIS — E440 Moderate protein-calorie malnutrition: Secondary | ICD-10-CM

## 2017-05-08 DIAGNOSIS — Z882 Allergy status to sulfonamides status: Secondary | ICD-10-CM

## 2017-05-08 DIAGNOSIS — E1021 Type 1 diabetes mellitus with diabetic nephropathy: Secondary | ICD-10-CM

## 2017-05-08 DIAGNOSIS — N183 Chronic kidney disease, stage 3 (moderate): Secondary | ICD-10-CM

## 2017-05-08 DIAGNOSIS — E11610 Type 2 diabetes mellitus with diabetic neuropathic arthropathy: Secondary | ICD-10-CM

## 2017-05-08 DIAGNOSIS — E1140 Type 2 diabetes mellitus with diabetic neuropathy, unspecified: Secondary | ICD-10-CM

## 2017-05-08 DIAGNOSIS — E1051 Type 1 diabetes mellitus with diabetic peripheral angiopathy without gangrene: Secondary | ICD-10-CM

## 2017-05-08 LAB — CBC
HCT: 23.3 % — ABNORMAL LOW (ref 36.0–46.0)
Hemoglobin: 7.4 g/dL — ABNORMAL LOW (ref 12.0–15.0)
MCH: 26.4 pg (ref 26.0–34.0)
MCHC: 31.8 g/dL (ref 30.0–36.0)
MCV: 83.2 fL (ref 78.0–100.0)
PLATELETS: 498 10*3/uL — AB (ref 150–400)
RBC: 2.8 MIL/uL — AB (ref 3.87–5.11)
RDW: 16.3 % — ABNORMAL HIGH (ref 11.5–15.5)
WBC: 7.5 10*3/uL (ref 4.0–10.5)

## 2017-05-08 LAB — BASIC METABOLIC PANEL
ANION GAP: 9 (ref 5–15)
BUN: 44 mg/dL — ABNORMAL HIGH (ref 6–20)
CALCIUM: 7 mg/dL — AB (ref 8.9–10.3)
CO2: 19 mmol/L — ABNORMAL LOW (ref 22–32)
Chloride: 105 mmol/L (ref 101–111)
Creatinine, Ser: 2.1 mg/dL — ABNORMAL HIGH (ref 0.44–1.00)
GFR calc Af Amer: 32 mL/min — ABNORMAL LOW (ref >60)
GFR, EST NON AFRICAN AMERICAN: 27 mL/min — AB (ref >60)
Glucose, Bld: 151 mg/dL — ABNORMAL HIGH (ref 65–99)
POTASSIUM: 4.4 mmol/L (ref 3.5–5.1)
SODIUM: 133 mmol/L — AB (ref 135–145)

## 2017-05-08 LAB — GLUCOSE, CAPILLARY
GLUCOSE-CAPILLARY: 57 mg/dL — AB (ref 65–99)
GLUCOSE-CAPILLARY: 454 mg/dL — AB (ref 65–99)
GLUCOSE-CAPILLARY: 157 mg/dL — AB (ref 65–99)
GLUCOSE-CAPILLARY: 380 mg/dL — AB (ref 65–99)
Glucose-Capillary: 230 mg/dL — ABNORMAL HIGH (ref 65–99)
Glucose-Capillary: 374 mg/dL — ABNORMAL HIGH (ref 65–99)

## 2017-05-08 MED ORDER — VANCOMYCIN HCL IN DEXTROSE 1-5 GM/200ML-% IV SOLN
1000.0000 mg | INTRAVENOUS | Status: DC
  Administered 2017-05-08: 1000 mg via INTRAVENOUS
  Filled 2017-05-08: qty 200

## 2017-05-08 MED ORDER — INSULIN ASPART 100 UNIT/ML ~~LOC~~ SOLN
4.0000 [IU] | Freq: Once | SUBCUTANEOUS | Status: AC
  Administered 2017-05-08: 4 [IU] via SUBCUTANEOUS

## 2017-05-08 MED ORDER — INSULIN ASPART 100 UNIT/ML ~~LOC~~ SOLN
8.0000 [IU] | Freq: Once | SUBCUTANEOUS | Status: AC
  Administered 2017-05-08: 7 [IU] via SUBCUTANEOUS

## 2017-05-08 MED ORDER — CEFAZOLIN SODIUM-DEXTROSE 2-4 GM/100ML-% IV SOLN
2.0000 g | INTRAVENOUS | Status: DC
  Filled 2017-05-08: qty 100

## 2017-05-08 NOTE — Progress Notes (Signed)
Inpatient Diabetes Program Recommendations  AACE/ADA: New Consensus Statement on Inpatient Glycemic Control (2015)  Target Ranges:  Prepandial:   less than 140 mg/dL      Peak postprandial:   less than 180 mg/dL (1-2 hours)      Critically ill patients:  140 - 180 mg/dL   Lab Results  Component Value Date   GLUCAP 230 (H) 05/08/2017   HGBA1C 10.6 04/08/2017    Review of Glycemic Control Results for AILYNN, GOW (MRN [id number]) as of 05/08/2017 13:03  Ref. Range 05/07/2017 16:35 05/07/2017 16:51 05/07/2017 17:22 05/07/2017 17:46 05/07/2017 21:41  Glucose-Capillary Latest Ref Range: 65 - 99 mg/dL 46 (L) 36 (LL) 57 (L) 89 352 (H)    Diabetes history: Type 1 DM Outpatient Diabetes medications: Humalog SSI, Lantus 12 Units in AM, 8 Units in PM Current orders for Inpatient glycemic control: Novolog 0-8 TID, Lantus 5 Units QHS   Noted hypoglycemic event and orders that were changed for Novolog 0-8 Units TID custom correction scale. Patient is sensitive to insulin, as correction in previous pump settings were 1:60 with target of 150 mg/dL. No recommendations at this time, given custom scale change. Will watch.   Thanks, Bronson Curb, MSN, RNC-OB Diabetes Coordinator [phone] (8a-5p)

## 2017-05-08 NOTE — Consult Note (Signed)
ORTHOPAEDIC CONSULTATION  REQUESTING PHYSICIAN: Eugenie Filler, MD  Chief Complaint: Left diabetic foot infection, osteomyelitis  HPI: [name] is a 47 y.o. female who has poorly controlled DM that was admitted to the hospital for diabetic foot infection.  She underwent I&D by podiatry on 05/06/17.  Podiatrist felt her left foot was not salvageable.  Ortho consulted for evaluation for BKA.    Past Medical History:  Diagnosis Date  . Anemia   . C. difficile diarrhea 09/26/2014  . Cataracts, both eyes   . Cellulitis of right foot 06/02/2014  . CKD (chronic kidney disease) stage 3, GFR 30-59 ml/min (HCC)    sees Dr Justin Mend- Stage IV  . Diabetic ulcer of right foot ([place]) 06/02/2014  . DVT (deep venous thrombosis) ([place]) 10/2014   one in each one in leg- PICC line   . H/O seasonal allergies   . Heart murmur    told once when she was pregnant- no furter mention- was in notes 11/2015- had Echo 8 /4/17  . History of blood transfusion   . Hypercholesteremia   . Hypertension   . Left foot infection   . Neuropathy    feet  . Neuropathy in diabetes ([place])   . Peripheral vascular disease ([place])   . Staphylococcus aureus bacteremia 10/2014  . Type 1 diabetes ([place])    onset age 32   Past Surgical History:  Procedure Laterality Date  . AMPUTATION TOE Left 07/24/2015   5th toe and Hallux amputation  . CESAREAN SECTION    . EXOSTECTECTOMY TOE Left 04/11/2016   Procedure: EXOSTECTECTOMY CUBOID AND 5TH METATARSAL AND PLACEMENT OF ANTIBIOTIC BEADS;  Surgeon: Edrick Kins, DPM;  Location: [facility];  Service: Podiatry;  Laterality: Left;  . EYE SURGERY Bilateral    [place]  . FEMORAL-POPLITEAL BYPASS GRAFT Left 05/03/2015  . FOOT AMPUTATION THROUGH METATARSAL Right 2016  . hemrrhoidectomy    . I&D EXTREMITY Right 06/10/2014   Procedure: IRRIGATION AND DEBRIDEMENT Right Foot;  Surgeon: Newt Minion, MD;  Location: [facility];  Service: Orthopedics;  Laterality: Right;  . INCISION AND DRAINAGE Left  04/11/2016   Procedure: INCISION AND DRAINAGE A DEEP COMPLICATED WOUND OF LEFT FOOT;  Surgeon: Edrick Kins, DPM;  Location: [facility];  Service: Podiatry;  Laterality: Left;  . IRRIGATION AND DEBRIDEMENT ABSCESS Left 05/06/2017   Procedure: IRRIGATION AND DEBRIDEMENT ABSCESS LEFT FOOT;  Surgeon: Edrick Kins, DPM;  Location: [facility];  Service: Podiatry;  Laterality: Left;  . PERIPHERALLY INSERTED CENTRAL CATHETER INSERTION    . SKIN GRAFT Right 06/15/2014  . SKIN GRAFT Left 06/2015   foot   . SKIN SPLIT GRAFT Right 06/15/2014   Procedure: SPLIT THICKNESS SKIN GRAFT RIGHT FOOT;  Surgeon: Newt Minion, MD;  Location: [facility];  Service: Orthopedics;  Laterality: Right;  . TRANSMETATARSAL AMPUTATION Right   . TRANSMETATARSAL AMPUTATION Right 09/11/2014   Social History   Socioeconomic History  . Marital status: Single    Spouse name: None  . Number of children: None  . Years of education: None  . Highest education level: None  Social Needs  . Financial resource strain: None  . Food insecurity - worry: None  . Food insecurity - inability: None  . Transportation needs - medical: None  . Transportation needs - non-medical: None  Occupational History  . Occupation: disabled  Tobacco Use  . Smoking status: Former Smoker    Years: 15.00    Last attempt to quit:  05/16/2008    Years since quitting: 8.9  . Smokeless tobacco: Never Used  Substance and Sexual Activity  . Alcohol use: Yes    Comment: Occasional  . Drug use: No  . Sexual activity: Yes    Birth control/protection: Pill  Other Topics Concern  . None  Social History Narrative  . None   Family History  Problem Relation Age of Onset  . Hyperlipidemia Mother   . Dementia Mother    - negative except otherwise stated in the family history section Allergies  Allergen Reactions  . Cleocin [Clindamycin Hcl] Diarrhea    Diarrhea   . Lisinopril Other (See Comments)    Elevated potassium per pt report  . Amoxicillin Diarrhea and  Nausea Only    Has patient had a PCN reaction causing immediate rash, facial/tongue/throat swelling, SOB or lightheadedness with hypotension: No Has patient had a PCN reaction causing severe rash involving mucus membranes or skin necrosis: No Has patient had a PCN reaction that required hospitalization: No Has patient had a PCN reaction occurring within the last 10 years: No If all of the above answers are "NO", then may proceed with Cephalosporin use.   . Bactrim [Sulfamethoxazole-Trimethoprim] Diarrhea and Nausea Only  . Ciprofloxacin Itching   Prior to Admission medications   Medication Sig Start Date End Date Taking? Authorizing Provider  albuterol (PROVENTIL HFA;VENTOLIN HFA) 108 (90 BASE) MCG/ACT inhaler Inhale 2 puffs into the lungs every 6 (six) hours as needed for wheezing or shortness of breath. Reported on 04/15/2015   Yes [provider]  amLODipine (NORVASC) 2.5 MG tablet Take 1 tablet (2.5 mg total) by mouth daily. 01/15/17  Yes Elayne Snare, MD  aspirin 325 MG tablet Take 325 mg by mouth daily.   Yes [provider]  calcitRIOL (ROCALTROL) 0.5 MCG capsule Take 0.5 mcg by mouth daily.  03/13/16  Yes [provider]  Calcium-Phosphorus-Vitamin D (CALCIUM GUMMIES) [phone] MG-MG-UNIT CHEW Chew 2 each by mouth daily.   Yes [provider]  CINNAMON PO Take 1 tablet by mouth daily.   Yes [provider]  ciprofloxacin (CIPRO) 500 MG tablet Take 500 mg by mouth 2 (two) times daily.   Yes [provider]  Continuous Blood Gluc Receiver (FREESTYLE LIBRE READER) DEVI 1 Device by Does not apply route as directed. 02/28/17  Yes Elayne Snare, MD  Continuous Blood Gluc Sensor (FREESTYLE LIBRE SENSOR SYSTEM) MISC Use 1 sensor every 10 days 02/28/17  Yes Elayne Snare, MD  CONTOUR NEXT TEST test strip USE AS INSTRUCTED TO CHECK BLOOD SUGAR 4 TIMES A DAY. DX E10.65 05/04/17  Yes Elayne Snare, MD  folic acid (FOLVITE) 1 MG tablet Take 1 mg by mouth  daily.   Yes [provider]  furosemide (LASIX) 40 MG tablet Take 40 mg by mouth daily as needed for fluid.    Yes [provider]  gabapentin (NEURONTIN) 300 MG capsule Take 1 capsule (300 mg total) by mouth 2 (two) times daily. Patient taking differently: Take 300 mg by mouth 2 (two) times daily as needed (nerve pain).  09/10/16  Yes Edrick Kins, DPM  HUMALOG 100 UNIT/ML injection CALL MD IF <70, IF 151-200 =2UNITS,201-250 =4 UNITS,251-300 =6 UNITS, 301-350 =8 UNITS,351-400 =10 U 05/04/17  Yes Elayne Snare, MD  Insulin Glargine (LANTUS SOLOSTAR) 100 UNIT/ML Solostar Pen 12 units in the morning and 8 units at night Patient taking differently: Inject 8-12 Units into the skin 2 (two) times daily. 12 units in the  morning and 8 units at night 04/08/17  Yes Elayne Snare, MD  Insulin Pen Needle (PEN NEEDLES) 32G X 4 MM MISC 1 Device by Does not apply route 2 (two) times daily. 04/08/17  Yes Elayne Snare, MD  ketorolac (ACULAR) 0.5 % ophthalmic solution Place 1 drop into the right eye 4 (four) times daily. Start 1 week prior to surgery scheduled for 02/05 03/28/17  Yes [provider]  ondansetron (ZOFRAN-ODT) 4 MG disintegrating tablet DISSOLVE 1 TABLET ON TONGUE EVERY 8 HOURS AS NEEDED FOR NAUSEA 04/23/16  Yes [provider]  pravastatin (PRAVACHOL) 40 MG tablet Take 1 tablet (40 mg total) by mouth daily. 01/02/17  Yes Elayne Snare, MD  pyridoxine (B-6) 100 MG tablet Take 100 mg by mouth daily. Reported on 04/15/2015   Yes [provider]  vitamin C (ASCORBIC ACID) 500 MG tablet Take 500 mg by mouth daily.   Yes [provider]  brimonidine (ALPHAGAN) 0.2 % ophthalmic solution Place 1 drop into the right eye 2 (two) times daily. Surgery scheduled for 02/05, meds to be started 02/03 03/28/17   [provider]  cyclopentolate (CYCLODRYL,CYCLOGYL) 1 % ophthalmic solution Place 1 drop into the right eye 3 (three) times daily. Surgery scheduled for 02/05,  meds to be started 02/04 03/28/17   [provider]  prednisoLONE acetate (PRED FORTE) 1 % ophthalmic suspension Place 1 drop into the right eye 4 (four) times daily. Surgery scheduled for 02/05, meds to be started 02/05 after surgery 03/28/17   [provider]  Probiotic CAPS Take 1 capsule by mouth daily. Patient not taking: Reported on 05/04/2017 09/29/16   Ward, Delice Bison, DO  traMADol (ULTRAM) 50 MG tablet Take 1 tablet (50 mg total) by mouth every 6 (six) hours as needed. Patient not taking: Reported on 05/02/2017 09/29/16   Ward, Delice Bison, DO  trimethoprim-polymyxin b (POLYTRIM) ophthalmic solution Place 1 drop into both eyes 4 (four) times daily. Surgery scheduled for 02/05, meds to be started 02/03 03/28/17   [provider]   No results found. - pertinent xrays, CT, MRI studies were reviewed and independently interpreted  Positive ROS: All other systems have been reviewed and were otherwise negative with the exception of those mentioned in the HPI and as above.  Physical Exam: General: Alert, no acute distress Cardiovascular: No pedal edema Respiratory: No cyanosis, no use of accessory musculature GI: No organomegaly, abdomen is soft and non-tender Skin: No lesions in the area of chief complaint Neurologic: Sensation intact distally Psychiatric: Patient is competent for consent with normal mood and affect Lymphatic: No axillary or cervical lymphadenopathy  MUSCULOSKELETAL:  Necrotic skin, muscle, tendon and exposed metatarsals       Assessment: Left diabetic foot infection, osteomyelitis, necrosis  Plan: - discussed with patient that foot is not salvageable at this point - MRI is c/w osteomyelitis - recommendation is for BKA - briefly discussed that Ertl procedure gives no benefit over standard BKA but patient is welcome to pursue this operation in [place] if she so desires - I have tentatively [facility] - appreciate the patient's and hospitalist's willingness to transfer once bed is available - NPO after midnight.  Thank you for the consult and the opportunity to see [name]. Eduard Roux, MD [place] 4:04 PM

## 2017-05-08 NOTE — Progress Notes (Signed)
OT Cancellation Note  Patient Details Name: [name] MRN: [id number] DOB: 1971/02/28   Cancelled Treatment:    Reason Eval/Treat Not Completed: Fatigue/lethargy limiting ability to participate  Betsy Pries 05/08/2017, 3:10 PM

## 2017-05-08 NOTE — Progress Notes (Signed)
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  Patient noted to have a gangrenous left foot with osteomyelitis of the left foot.  Patient seen in consultation by ID and podiatry.  Patient underwent incision and drainage with cultures taken per podiatry.  Podiatry recommending consultation with orthopedics for BKA. Orthopedics requesting patient transferred to [facility] in anticipation for surgery on 05/09/2017.     Assessment & Plan:   Principal Problem:   Gangrene of left foot (HCC) Active Problems:   Osteomyelitis of left foot (HCC)   Hypertension   Hypercholesteremia   CKD (chronic kidney disease), stage III (HCC)   DM type 1 causing renal disease (HCC)   Cellulitis of left foot   Chronic anemia   Protein-calorie malnutrition, moderate (HCC)   Diabetes mellitus type 1 with peripheral artery disease (HCC)   Cellulitis   Diabetic foot ulcer (HCC)   Diabetic foot infection ([place])  #1 left gangrenous foot/osteomyelitis left foot/cellulitis left foot/left foot cellulitis Patient presented initially with cellulitis/left diabetic foot ulcer noted to have a gangrene his left foot with osteomyelitis of the left foot noted on MRI of the fourth metatarsal.  Patient status post incision and drainage per [name], podiatry 05/06/2017 with preliminary results of polymicrobial flora with final cultures pending.  Patient currently empirically on IV vancomycin and IV cefepime per ID recommendations.  Podiatry recommending orthopedics consultation for BKA.   Spoke with Dr.Xu of orthopedics who will assess the patient and requesting patient be transferred to [facility] for probable surgery on 05/09/2017.  2.  Poorly controlled diabetes mellitus type 1 Hemoglobin A1c 10.5.  Patient noted to be a brittle diabetic and has had some hypoglycemic spells during the hospitalization.  CBGs currently ranging from 157-454.  Continue current dose of Lantus and sliding scale insulin.  3.  Hyperlipidemia Continue statin.  4.  Hypertension Continue current regimen of Norvasc 2.5 mg daily.  5.  Moderate protein calorie malnutrition Nutritional supplementation.  6.  Chronic kidney disease stage III Stable.     DVT prophylaxis: Lovenox  Code Status: Full Family Communication: Updated patient.  No family at bedside. Disposition Plan: Transfer to [facility] per orthopedics for probable amputation tomorrow 05/09/2017   Consultants:   Podiatry: [name] 05/06/2017  Infectious disease: Dr. Johnnye Sima 05/05/2017  Wound care: Cristy Hilts, RN 05/05/2017  Orthopedics: Dr.Xu pending for 05/08/2017  Procedures:   Chest x-ray 05/04/2017  Plain films of the left foot 05/05/2017  MRI left foot 05/06/2017  Incision and drainage left foot per [name], podiatry 05/06/2017  Antimicrobials:   IV cefepime 05/04/2017  IV vancomycin 05/04/2017   Subjective: She is sitting up in bed.  Denies any chest pain or shortness of breath.  Multiple questions about potential amputation.  Objective: Vitals:   05/08/17 0157 05/08/17 0531 05/08/17 0741 05/08/17 1156  BP: 137/70 129/62 137/63 (!) 151/77  Pulse: 86 79 78 89  Resp: 18 18 18 16   Temp: 99.1 F (37.3 C) 98.5 F (36.9 C) 98.7 F (37.1 C) 98.6 F (37 C)  TempSrc: Oral  Oral Oral Oral  SpO2: 96% 98% 97% 100%  Weight:      Height:        Intake/Output Summary (Last 24 hours) at 05/08/2017 1516 Last data filed at 05/07/2017 1619 Gross per 24 hour  Intake 120 ml  Output -  Net 120 ml   Filed Weights     05/04/17 1645 05/06/17 1930  Weight: 57.6 kg (127 lb) 57.6 kg (127 lb)    Examination:  General exam: Appears calm and comfortable  Respiratory system: Clear to auscultation. Respiratory effort normal. Cardiovascular system: S1 & S2 heard, RRR. No JVD, murmurs, rubs, gallops or clicks. No pedal edema. Gastrointestinal system: Abdomen is nondistended, soft and nontender. No organomegaly or masses felt. Normal bowel sounds heard. Central nervous system: Alert and oriented. No focal neurological deficits. Extremities: Left foot wrapped. Skin: No rashes, lesions or ulcers Psychiatry: Judgement and insight appear normal. Mood & affect appropriate.     Data Reviewed: I have personally reviewed following labs and imaging studies  CBC: Recent Labs  Lab 05/04/17 1848 05/05/17 0609 05/06/17 0813 05/07/17 0650 05/08/17 0612  WBC 18.9* 12.7* 12.1* 15.2* 7.5  NEUTROABS 16.8*  --   --   --   --   HGB 9.8* 8.4* 8.7* 8.4* 7.4*  HCT 29.8* 25.9* 26.9* 27.0* 23.3*  MCV 81.2 83.5 82.3 84.4 83.2  PLT 539* 463* 522* 558* 025*   Basic Metabolic Panel: Recent Labs  Lab 05/04/17 1848 05/05/17 0609 05/06/17 0813 05/07/17 0650 05/08/17 0612  NA 134* 132* 136 136 133*  K 5.5* 4.5 4.4 5.3* 4.4  CL 102 105 108 107 105  CO2 19* 19* 20* 18* 19*  GLUCOSE 243* 240* 105* 226* 151*  BUN 49* 45* 40* 35* 44*  CREATININE 2.66* 2.58* 2.16* 2.17* 2.10*  CALCIUM 8.3* 7.3* 7.4* 7.2* 7.0*   GFR: Estimated Creatinine Clearance: 30.8 mL/min (A) (by C-G formula based on SCr of 2.1 mg/dL (H)). Liver Function Tests: Recent Labs  Lab 05/04/17 1848  AST 34  ALT 41  ALKPHOS 300*  BILITOT 0.3  PROT 7.8  ALBUMIN 2.8*   No results for input(s): LIPASE, AMYLASE in the last 168 hours. No results for input(s): AMMONIA in the last 168 hours. Coagulation Profile: No results for input(s): INR, PROTIME in the last 168 hours. Cardiac Enzymes: No results for input(s): CKTOTAL, CKMB, CKMBINDEX, TROPONINI in  the last 168 hours. BNP (last 3 results) No results for input(s): PROBNP in the last 8760 hours. HbA1C: No results for input(s): HGBA1C in the last 72 hours. CBG: Recent Labs  Lab 05/07/17 1746 05/07/17 2141 05/08/17 0156 05/08/17 0739 05/08/17 1155  GLUCAP 89 352* 454* 157* 230*   Lipid Profile: No results for input(s): CHOL, HDL, LDLCALC, TRIG, CHOLHDL, LDLDIRECT in the last 72 hours. Thyroid Function Tests: No results for input(s): TSH, T4TOTAL, FREET4, T3FREE, THYROIDAB in the last 72 hours. Anemia Panel: Recent Labs    05/06/17 0813  VITAMINB12 457  FOLATE 3.7*  FERRITIN 67  TIBC 263  IRON 9*  RETICCTPCT 1.3   Sepsis Labs: Recent Labs  Lab 05/04/17 1902  LATICACIDVEN 1.76    Recent Results (from the past 240 hour(s))  Blood Culture (routine x 2)     Status: None (Preliminary result)   Collection Time: 05/04/17  6:00 PM  Result Value Ref Range Status   Specimen Description   Final    BLOOD LEFT HAND Performed at [facility], [place] [address] [place], [place] [address]  Special Requests   Final    IN PEDIATRIC BOTTLE Blood Culture adequate volume Performed at [facility], [place] [place], [place] [address]    Culture   Final    NO GROWTH 2 DAYS Performed at [facility], [place] [address], [place], [place] [address]    Report Status PENDING  Incomplete  Blood Culture (routine x 2)     Status: None (Preliminary result)   Collection Time: 05/04/17  6:50 PM  Result Value Ref Range Status   Specimen Description   Final    BLOOD LEFT ARM Performed at [facility], [place] [address], [place], [place] [address]    Special Requests   Final    BOTTLES DRAWN AEROBIC AND ANAEROBIC Blood Culture adequate volume Performed at [facility], [place] [place], [place] [address]    Culture   Final    NO GROWTH 2 DAYS Performed at [facility], [place] [address] [place], [place] [address]    Report Status  PENDING  Incomplete  MRSA PCR Screening     Status: None   Collection Time: 05/06/17  6:14 AM  Result Value Ref Range Status   MRSA by PCR NEGATIVE NEGATIVE Final    Comment:        The GeneXpert MRSA Assay (FDA approved for NASAL specimens only), is one component of a comprehensive MRSA colonization surveillance program. It is not intended to diagnose MRSA infection nor to guide or monitor treatment for MRSA infections. Performed at [facility], [place] [address], [place], [place] [address]   Aerobic/Anaerobic Culture (surgical/deep wound)     Status: None (Preliminary result)   Collection Time: 05/06/17  9:03 PM  Result Value Ref Range Status   Specimen Description   Final    ABSCESS LEFT FOOT Performed at [facility] [address] [place], [place] [address]    Special Requests   Final    NONE Performed at [facility], [place] [address] [place], [place] [address]    Gram Stain   Final    ABUNDANT WBC PRESENT,BOTH PMN AND MONONUCLEAR MODERATE GRAM POSITIVE COCCI FEW GRAM NEGATIVE RODS    Culture   Final    MODERATE GROUP B STREP(S.AGALACTIAE)ISOLATED TESTING AGAINST S. AGALACTIAE NOT ROUTINELY PERFORMED DUE TO PREDICTABILITY OF AMP/PEN/VAN SUSCEPTIBILITY. Performed at [facility], [place] [address] [place],  [address]    Report Status PENDING  Incomplete         Radiology Studies: No results found.      Scheduled Meds: . acidophilus  1 capsule Oral Daily  . amLODipine  2.5 mg Oral Daily  . aspirin  325 mg Oral Daily  . brimonidine  1 drop Right Eye BID  . calcitRIOL  0.5 mcg Oral Daily  . cyclopentolate  1 drop Right Eye TID  . enoxaparin (LOVENOX) injection  30 mg Subcutaneous QHS  . folic acid  1 mg Oral Daily  . insulin aspart  0-8 Units Subcutaneous TID WC  . insulin glargine  5 Units Subcutaneous Daily  . ketorolac  1 drop Right Eye QID  . loratadine  10 mg Oral Daily  .  pravastatin  40 mg Oral Daily  . prednisoLONE acetate  1 drop Right Eye QID  . sodium chloride flush  3 mL Intravenous Q12H  . trimethoprim-polymyxin b  1 drop Both Eyes QID   Continuous Infusions: .  sodium chloride    . ceFEPime (MAXIPIME) IV Stopped (05/07/17 2045)  . vancomycin       LOS: 4 days    Time spent: 35 mins    Irine Seal, MD [facility] Pager [phone]  If 7PM-7AM, please contact night-coverage [email or web address] Password [email or web address] 05/08/2017, 3:16 PM

## 2017-05-08 NOTE — Progress Notes (Signed)
Patient ID: [name], female   DOB: November 06, 1970, 47 y.o.   MRN: [id number]         [facility]  Date of Admission:  05/04/2017           Day 4 vancomycin        Day 4 cefepime ASSESSMENT: She has a severe, deep left foot infection with polymicrobial flora as expected.  I will continue current antibiotics pending final cultures and surgical evaluation.  She has competing concerns about undergoing BKA and the risk of a sending infection leading to more extensive surgeries.  I encouraged her to speak with our surgeons here about the pros and cons of BKA before making any final decisions about what she wants to do.  I have reiterated to her that she has a very extensive infection that flared up shortly after stopping a longer course of brought her antibiotics for her right foot infection.  I told her that the chance of curing that infection without surgery is very small.  I also told her that even if we were able to cure it without surgery she is still left with poorly controlled diabetes, neuropathy and Charcot arthropathy that puts her at increased risk for repeating this pattern of infection over and over.  PLAN: 1. Continue current antibiotics pending final cultures 2. I will follow-up tomorrow   Principal Problem:   Cellulitis of left foot Active Problems:   Hypertension   Hypercholesteremia   CKD (chronic kidney disease), stage III (HCC)   DM type 1 causing renal disease (HCC)   Chronic anemia   Protein-calorie malnutrition, moderate (HCC)   Diabetes mellitus type 1 with peripheral artery disease (HCC)   Cellulitis   Diabetic foot ulcer (HCC)   Diabetic foot infection ([place])   Scheduled Meds: . acidophilus  1 capsule Oral Daily  . amLODipine  2.5 mg Oral Daily  . aspirin  325 mg Oral Daily  . brimonidine  1 drop Right Eye BID  . calcitRIOL  0.5 mcg Oral Daily  . cyclopentolate  1 drop Right Eye TID  . enoxaparin (LOVENOX) injection  30 mg  Subcutaneous QHS  . folic acid  1 mg Oral Daily  . insulin aspart  0-8 Units Subcutaneous TID WC  . insulin glargine  5 Units Subcutaneous Daily  . ketorolac  1 drop Right Eye QID  . loratadine  10 mg Oral Daily  . pravastatin  40 mg Oral Daily  . prednisoLONE acetate  1 drop Right Eye QID  . sodium chloride flush  3 mL Intravenous Q12H  . trimethoprim-polymyxin b  1 drop Both Eyes QID   Continuous Infusions: . sodium chloride    . ceFEPime (MAXIPIME) IV Stopped (05/07/17 2045)  . vancomycin     PRN Meds:.sodium chloride, acetaminophen **OR** acetaminophen, guaiFENesin, HYDROcodone-acetaminophen, morphine injection, ondansetron **OR** ondansetron (ZOFRAN) IV, oxyCODONE-acetaminophen, polyethylene glycol, promethazine, sodium chloride flush   SUBJECTIVE: She tells me that she kind of "freaked out yesterday and cried and cried".  She has been talking to her father about her options and tells me that she is interested in getting a second opinion from a Dr. Carollee Leitz (?) in [place].  Review of Systems: Review of Systems  Constitutional: Negative for chills, diaphoresis and fever.  Gastrointestinal: Negative for abdominal pain, diarrhea, nausea and vomiting.  Musculoskeletal: Negative for joint pain.    Allergies  Allergen Reactions  . Cleocin [Clindamycin Hcl] Diarrhea    Diarrhea   . Lisinopril  Other (See Comments)    Elevated potassium per pt report  . Amoxicillin Diarrhea and Nausea Only    Has patient had a PCN reaction causing immediate rash, facial/tongue/throat swelling, SOB or lightheadedness with hypotension: No Has patient had a PCN reaction causing severe rash involving mucus membranes or skin necrosis: No Has patient had a PCN reaction that required hospitalization: No Has patient had a PCN reaction occurring within the last 10 years: No If all of the above answers are "NO", then may proceed with Cephalosporin use.   . Bactrim [Sulfamethoxazole-Trimethoprim]  Diarrhea and Nausea Only  . Ciprofloxacin Itching    OBJECTIVE: Vitals:   05/07/17 2147 05/08/17 0157 05/08/17 0531 05/08/17 0741  BP: (!) 155/76 137/70 129/62 137/63  Pulse: 95 86 79 78  Resp: 18 18 18 18   Temp: 98.9 F (37.2 C) 99.1 F (37.3 C) 98.5 F (36.9 C) 98.7 F (37.1 C)  TempSrc: Oral Oral Oral Oral  SpO2: 100% 96% 98% 97%  Weight:      Height:       Body mass index is 19.89 kg/m.  Physical Exam  Constitutional:  She is in good spirits.    Musculoskeletal:  Her foot is dressed.    Lab Results Lab Results  Component Value Date   WBC 7.5 05/08/2017   HGB 7.4 (L) 05/08/2017   HCT 23.3 (L) 05/08/2017   MCV 83.2 05/08/2017   PLT 498 (H) 05/08/2017    Lab Results  Component Value Date   CREATININE 2.10 (H) 05/08/2017   BUN 44 (H) 05/08/2017   NA 133 (L) 05/08/2017   K 4.4 05/08/2017   CL 105 05/08/2017   CO2 19 (L) 05/08/2017    Lab Results  Component Value Date   ALT 41 05/04/2017   AST 34 05/04/2017   ALKPHOS 300 (H) 05/04/2017   BILITOT 0.3 05/04/2017     Microbiology: Recent Results (from the past 240 hour(s))  Blood Culture (routine x 2)     Status: None (Preliminary result)   Collection Time: 05/04/17  6:00 PM  Result Value Ref Range Status   Specimen Description   Final    BLOOD LEFT HAND Performed at [facility], [address], [place], [place] [address]    Special Requests   Final    IN PEDIATRIC BOTTLE Blood Culture adequate volume Performed at [facility], [address] [place], [place] [address]    Culture   Final    NO GROWTH 2 DAYS Performed at [facility], [place] [address], [place], [place] [address]    Report Status PENDING  Incomplete  Blood Culture (routine x 2)     Status: None (Preliminary result)   Collection Time: 05/04/17  6:50 PM  Result Value Ref Range Status   Specimen Description   Final    BLOOD LEFT ARM Performed at [facility], [place] [address], [place], [place] [address]     Special Requests   Final    BOTTLES DRAWN AEROBIC AND ANAEROBIC Blood Culture adequate volume Performed at [facility], [address] [place], [place] [address]    Culture   Final    NO GROWTH 2 DAYS Performed at [facility], [place] [address], [place], [place] [address]    Report Status PENDING  Incomplete  MRSA PCR Screening     Status: None   Collection Time: 05/06/17  6:14 AM  Result Value Ref  Range Status   MRSA by PCR NEGATIVE NEGATIVE Final    Comment:        The GeneXpert MRSA Assay (FDA approved for NASAL specimens only), is one component of a comprehensive MRSA colonization surveillance program. It is not intended to diagnose MRSA infection nor to guide or monitor treatment for MRSA infections. Performed at [facility], [place] [address] [place], [place] [address]   Aerobic/Anaerobic Culture (surgical/deep wound)     Status: None (Preliminary result)   Collection Time: 05/06/17  9:03 PM  Result Value Ref Range Status   Specimen Description   Final    ABSCESS LEFT FOOT Performed at [place] [address], [place], [place] [address]    Special Requests   Final    NONE Performed at [facility], [place] [address], [place], [place] [address]    Gram Stain   Final    ABUNDANT WBC PRESENT,BOTH PMN AND MONONUCLEAR MODERATE GRAM POSITIVE COCCI FEW GRAM NEGATIVE RODS Performed at [facility], [place] [address], [place], [place] [address]    Culture PENDING  Incomplete   Report Status PENDING  Incomplete    Michel Bickers, MD [facility] [place] [facility] [phone] pager   [phone] cell 05/08/2017, 11:13 AM

## 2017-05-09 ENCOUNTER — Ambulatory Visit: Payer: Medicare Other | Admitting: Internal Medicine

## 2017-05-09 ENCOUNTER — Encounter (HOSPITAL_COMMUNITY): Payer: Self-pay | Admitting: Certified Registered Nurse Anesthetist

## 2017-05-09 ENCOUNTER — Encounter (HOSPITAL_COMMUNITY)
Admission: EM | Disposition: A | Discharge status: Home / Self Care | Payer: Self-pay | Source: Home / Self Care | Attending: Internal Medicine

## 2017-05-09 LAB — GLUCOSE, CAPILLARY
GLUCOSE-CAPILLARY: 372 mg/dL — AB (ref 65–99)
GLUCOSE-CAPILLARY: 432 mg/dL — AB (ref 65–99)
Glucose-Capillary: 189 mg/dL — ABNORMAL HIGH (ref 65–99)
Glucose-Capillary: 173 mg/dL — ABNORMAL HIGH (ref 65–99)
Glucose-Capillary: 189 mg/dL — ABNORMAL HIGH (ref 65–99)

## 2017-05-09 LAB — CBC
HCT: 22.2 % — ABNORMAL LOW (ref 36.0–46.0)
Hemoglobin: 7.4 g/dL — ABNORMAL LOW (ref 12.0–15.0)
MCH: 27 pg (ref 26.0–34.0)
MCHC: 33.3 g/dL (ref 30.0–36.0)
MCV: 81 fL (ref 78.0–100.0)
PLATELETS: 560 10*3/uL — AB (ref 150–400)
RBC: 2.74 MIL/uL — AB (ref 3.87–5.11)
RDW: 16.3 % — ABNORMAL HIGH (ref 11.5–15.5)
WBC: 8.3 10*3/uL (ref 4.0–10.5)

## 2017-05-09 LAB — BASIC METABOLIC PANEL
ANION GAP: 9 (ref 5–15)
BUN: 48 mg/dL — AB (ref 6–20)
CHLORIDE: 106 mmol/L (ref 101–111)
CO2: 19 mmol/L — ABNORMAL LOW (ref 22–32)
Calcium: 6.7 mg/dL — ABNORMAL LOW (ref 8.9–10.3)
Creatinine, Ser: 2.25 mg/dL — ABNORMAL HIGH (ref 0.44–1.00)
GFR calc Af Amer: 29 mL/min — ABNORMAL LOW (ref >60)
GFR, EST NON AFRICAN AMERICAN: 25 mL/min — AB (ref >60)
Glucose, Bld: 414 mg/dL — ABNORMAL HIGH (ref 65–99)
POTASSIUM: 3.9 mmol/L (ref 3.5–5.1)
SODIUM: 134 mmol/L — AB (ref 135–145)

## 2017-05-09 SURGERY — AMPUTATION BELOW KNEE
Anesthesia: Choice | Site: Leg Lower | Laterality: Left

## 2017-05-09 MED ORDER — INSULIN GLARGINE 100 UNIT/ML ~~LOC~~ SOLN
10.0000 [IU] | Freq: Every day | SUBCUTANEOUS | Status: DC
  Administered 2017-05-09 – 2017-05-10 (×2): 10 [IU] via SUBCUTANEOUS
  Filled 2017-05-09 (×2): qty 0.1

## 2017-05-09 MED ORDER — PROPOFOL 10 MG/ML IV BOLUS
INTRAVENOUS | Status: AC
  Filled 2017-05-09: qty 20

## 2017-05-09 MED ORDER — PROPOFOL 1000 MG/100ML IV EMUL
INTRAVENOUS | Status: AC
  Filled 2017-05-09: qty 100

## 2017-05-09 MED ORDER — PROPOFOL 10 MG/ML IV BOLUS
INTRAVENOUS | Status: AC
Start: 2017-05-09 — End: 2017-05-09
  Filled 2017-05-09: qty 20

## 2017-05-09 MED ORDER — INSULIN ASPART 100 UNIT/ML ~~LOC~~ SOLN
8.0000 [IU] | Freq: Once | SUBCUTANEOUS | Status: DC
Start: 2017-05-09 — End: 2017-05-10

## 2017-05-09 MED ORDER — MIDAZOLAM HCL 2 MG/2ML IJ SOLN
INTRAMUSCULAR | Status: AC
  Filled 2017-05-09: qty 2

## 2017-05-09 MED ORDER — SODIUM CHLORIDE 0.9 % IV SOLN
INTRAVENOUS | Status: DC
  Administered 2017-05-09: 12:00:00 via INTRAVENOUS

## 2017-05-09 MED ORDER — FENTANYL CITRATE (PF) 250 MCG/5ML IJ SOLN
INTRAMUSCULAR | Status: AC
  Filled 2017-05-09: qty 5

## 2017-05-09 NOTE — Progress Notes (Signed)
I had a discussion with the patient, her sister, and her father at bedside today regarding patient's desire to undergo an Ertl amputation.  In my opinion I do not feel that this gives any clinical benefit to her ultimate function or ability to wear a prosthetic limb.  The patient's father's friend apparently had this procedure done and did very well afterwards.  The patient has been in touch with the orthopedic surgeon's office in [place] and the particular surgeon is out of the country until the end of this month.  However according to the patient, prior to the patient's desired procedure she would have to undergo rehabilitation to improve her upper body strength for a month or so.  There was mention that I could do a below the knee amputation preliminarily so that the other orthopedic surgeon could do the definitive surgery later but because I am unfamiliar with the Ertl procedure I would not want to perform an inadequate preliminary surgery and thus potentially preclude her ability to have the Ertl procedure.  I feel that if the patient strongly desires to have the Ertl procedure then the next best option is to treat her with IV antibiotics and local dressing changes until she can be evaluated by the orthopedic surgeon in [place].  However she understands that there is the potential that she could become septic in the meantime and if that becomes the case then we would need to do the below the knee amputation more urgently.  The patient and her family are all in agreement with the plan and they are well aware of the potential risks of delaying surgery today.  We will have her transferred back to the floor.  I will go ahead and sign off for now.  If the patient changes her mind and wishes to go through with a below-the-knee amputation with me, the next available time I can do it is next Wednesday.  Azucena Cecil, MD [facility] [phone] 6:10 PM

## 2017-05-09 NOTE — Progress Notes (Signed)
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  Patient noted to have a gangrenous left foot with osteomyelitis of the left foot.  Patient seen in consultation by ID and podiatry.  Patient underwent incision and drainage with cultures taken per podiatry.  Podiatry recommending consultation with orthopedics for BKA. Orthopedics requesting patient transferred to [facility] in anticipation for surgery on 05/09/2017.     Assessment & Plan:   Principal Problem:   Gangrene of left foot (HCC) Active Problems:   Osteomyelitis of left foot (HCC)   Hypertension   Hypercholesteremia   CKD (chronic kidney disease), stage III (HCC)   DM type 1 causing renal disease (HCC)   Cellulitis of left foot   Chronic anemia   Protein-calorie malnutrition, moderate (HCC)   Diabetes mellitus type 1 with peripheral artery disease (HCC)   Cellulitis   Diabetic foot ulcer (HCC)   Diabetic foot infection ([place])  #1 left gangrenous foot/osteomyelitis left foot/cellulitis left foot/left foot cellulitis Patient presented initially with cellulitis/left diabetic foot ulcer noted to have a gangrene his left foot with osteomyelitis of the left foot noted on MRI of the fourth metatarsal.  Patient status post incision and drainage per [name], podiatry 05/06/2017 with preliminary results of polymicrobial flora with final cultures pending.  Patient currently empirically on IV vancomycin and IV cefepime per ID recommendations.  Podiatry recommending orthopedics consultation for BKA.   Patient has been assessed by Dr.Xu of orthopedics who feels patient foot is not salvageable at this point, MRI consistent with osteomyelitis recommendation is for BKA.  Orthopedics requesting patient be transferred to [facility] for probable surgery today 05/09/2017.  Patient to be transferred to [facility].  2.  Poorly controlled diabetes mellitus type 1 Hemoglobin A1c 10.5.  Patient noted to be a brittle diabetic and has had some hypoglycemic spells during the hospitalization.  CBGs currently ranging from 372-432.  Will increase current dose of Lantus to 10 units daily.  Continue sliding scale.  Monitor closely as patient is n.p.o.  3.  Hyperlipidemia Continue statin.  Outpatient follow-up.  4.  Hypertension Continue current regimen of Norvasc 2.5 mg daily.  5.  Moderate protein calorie malnutrition Nutritional supplementation.  6.  Chronic kidney disease stage III Stable.  Place on gentle hydration while n.p.o.   DVT prophylaxis: Lovenox  Code Status: Full Family Communication: Updated patient.  No family at bedside. Disposition Plan: Transfer to [facility] per orthopedics for probable amputation today 05/09/2017   Consultants:   Podiatry: [name] 05/06/2017  Infectious disease: Dr. Johnnye Sima 05/05/2017  Wound care: Cristy Hilts, RN 05/05/2017  Orthopedics: Dr.Xu 05/08/2017  Procedures:   Chest x-ray 05/04/2017  Plain films of the left foot 05/05/2017  MRI left foot 05/06/2017  Incision and drainage left foot per [name], podiatry 05/06/2017  Antimicrobials:   IV cefepime 05/04/2017  IV vancomycin 05/04/2017   Subjective: Patient sleeping however easily arousable.  Patient still with some questions and concerns about BKA procedure.  No chest pain.  No shortness of breath.  Patient trying to decide  as to whether to try to go to [place] for the surgery and whether she will be okay to go on oral antibiotics.  Patient in agreement to being transferred to  [facility] today in anticipation for surgery.  Objective: Vitals:   05/08/17 0741 05/08/17 1156 05/08/17 2300 05/09/17 0655  BP: 137/63 (!) 151/77 (!) 149/74 (!) 147/73  Pulse: 78 89 91 82  Resp: 18 16 16 18   Temp: 98.7 F (37.1 C) 98.6 F (37 C) 98.4 F (36.9 C) 98.6 F (37 C)  TempSrc: Oral Oral Oral Oral  SpO2: 97% 100% 98% 91%  Weight:      Height:        Intake/Output Summary (Last 24 hours) at 05/09/2017 0802 Last data filed at 05/09/2017 0300 Gross per 24 hour  Intake 803 ml  Output -  Net 803 ml   Filed Weights   05/04/17 1645 05/06/17 1930  Weight: 57.6 kg (127 lb) 57.6 kg (127 lb)    Examination:  General exam: Sleeping.  Easily arousable. Respiratory system: Clear to auscultation.  No wounds, no crackles, no rhonchi.  Respiratory effort normal. Cardiovascular system: Regular rate and rhythm no murmurs rubs or gallops.  No JVD.  No lower extremity edema.  Gastrointestinal system: Abdomen is soft, nontender, nondistended, positive bowel sounds.  No hepatosplenomegaly.   Central nervous system: Alert and oriented. No focal neurological deficits. Extremities: Left foot wrapped. Skin: No rashes, lesions or ulcers Psychiatry: Judgement and insight appear normal. Mood & affect appropriate.     Data Reviewed: I have personally reviewed following labs and imaging studies  CBC: Recent Labs  Lab 05/04/17 1848 05/05/17 0609 05/06/17 0813 05/07/17 0650 05/08/17 0612 05/09/17 0621  WBC 18.9* 12.7* 12.1* 15.2* 7.5 8.3  NEUTROABS 16.8*  --   --   --   --   --   HGB 9.8* 8.4* 8.7* 8.4* 7.4* 7.4*  HCT 29.8* 25.9* 26.9* 27.0* 23.3* 22.2*  MCV 81.2 83.5 82.3 84.4 83.2 81.0  PLT 539* 463* 522* 558* 498* 588*   Basic Metabolic Panel: Recent Labs  Lab 05/05/17 0609 05/06/17 0813 05/07/17 0650 05/08/17 0612 05/09/17 0621  NA 132* 136 136 133* 134*  K 4.5 4.4 5.3* 4.4 3.9  CL 105 108 107 105 106  CO2 19* 20* 18* 19* 19*  GLUCOSE 240* 105* 226* 151* 414*  BUN  45* 40* 35* 44* 48*  CREATININE 2.58* 2.16* 2.17* 2.10* 2.25*  CALCIUM 7.3* 7.4* 7.2* 7.0* 6.7*   GFR: Estimated Creatinine Clearance: 28.7 mL/min (A) (by C-G formula based on SCr of 2.25 mg/dL (H)). Liver Function Tests: Recent Labs  Lab 05/04/17 1848  AST 34  ALT 41  ALKPHOS 300*  BILITOT 0.3  PROT 7.8  ALBUMIN 2.8*   No results for input(s): LIPASE, AMYLASE in the last 168 hours. No results for input(s): AMMONIA in the last 168 hours. Coagulation Profile: No results for input(s): INR, PROTIME in the last 168 hours. Cardiac Enzymes: No results for input(s): CKTOTAL, CKMB, CKMBINDEX, TROPONINI in the last 168 hours. BNP (last 3 results) No results for input(s): PROBNP in the last 8760 hours. HbA1C: No results for input(s): HGBA1C in the last 72 hours. CBG: Recent Labs  Lab 05/08/17 0739 05/08/17 1155 05/08/17 1711 05/08/17 2113 05/09/17 0328  GLUCAP 157* 230* 374* 380* 372*   Lipid Profile: No results for input(s): CHOL, HDL, LDLCALC, TRIG, CHOLHDL, LDLDIRECT in the last 72 hours. Thyroid Function Tests: No results for input(s): TSH, T4TOTAL, FREET4, T3FREE,  THYROIDAB in the last 72 hours. Anemia Panel: Recent Labs    05/06/17 0813  VITAMINB12 457  FOLATE 3.7*  FERRITIN 67  TIBC 263  IRON 9*  RETICCTPCT 1.3   Sepsis Labs: Recent Labs  Lab 05/04/17 1902  LATICACIDVEN 1.76    Recent Results (from the past 240 hour(s))  Blood Culture (routine x 2)     Status: None (Preliminary result)   Collection Time: 05/04/17  6:00 PM  Result Value Ref Range Status   Specimen Description   Final    BLOOD LEFT HAND Performed at [facility], [place] [address], [place], [place] [address]    Special Requests   Final    IN PEDIATRIC BOTTLE Blood Culture adequate volume Performed at [facility], [place] [address] [place], [place] [address]    Culture   Final    NO GROWTH 3 DAYS Performed at [facility], [place] [address], [place], [place]  [address]    Report Status PENDING  Incomplete  Blood Culture (routine x 2)     Status: None (Preliminary result)   Collection Time: 05/04/17  6:50 PM  Result Value Ref Range Status   Specimen Description   Final    BLOOD LEFT ARM Performed at [facility], [place] [address] [place], [place] [address]    Special Requests   Final    BOTTLES DRAWN AEROBIC AND ANAEROBIC Blood Culture adequate volume Performed at [facility], [address] [place], [place] [address]    Culture   Final    NO GROWTH 3 DAYS Performed at [facility], [place] [address], [place], [place] [address]    Report Status PENDING  Incomplete  MRSA PCR Screening     Status: None   Collection Time: 05/06/17  6:14 AM  Result Value Ref Range Status   MRSA by PCR NEGATIVE NEGATIVE Final    Comment:        The GeneXpert MRSA Assay (FDA approved for NASAL specimens only), is one component of a comprehensive MRSA colonization surveillance program. It is not intended to diagnose MRSA infection nor to guide or monitor treatment for MRSA infections. Performed at [facility], [place] [address] [place], [place] [address]   Aerobic/Anaerobic Culture (surgical/deep wound)     Status: None (Preliminary result)   Collection Time: 05/06/17  9:03 PM  Result Value Ref Range Status   Specimen Description   Final    ABSCESS LEFT FOOT Performed at [place] [address] [place], [place] [address]    Special Requests   Final    NONE Performed at [facility], [place] [address] [place], [place] [address]    Gram Stain   Final    ABUNDANT WBC PRESENT,BOTH PMN AND MONONUCLEAR MODERATE GRAM POSITIVE COCCI FEW GRAM NEGATIVE RODS    Culture   Final    MODERATE GROUP B STREP(S.AGALACTIAE)ISOLATED TESTING AGAINST S. AGALACTIAE NOT ROUTINELY PERFORMED DUE TO PREDICTABILITY OF AMP/PEN/VAN SUSCEPTIBILITY. Performed at [facility], [place] [address], [place], [place] [address]    Report Status PENDING  Incomplete         Radiology Studies: No results found.      Scheduled Meds: . acidophilus  1 capsule Oral Daily  . amLODipine  2.5 mg Oral Daily  . aspirin  325 mg Oral Daily  . brimonidine  1 drop Right Eye BID  .  calcitRIOL  0.5 mcg Oral Daily  . cyclopentolate  1 drop Right Eye TID  . enoxaparin (LOVENOX) injection  30 mg Subcutaneous QHS  . folic acid  1 mg Oral Daily  . insulin aspart  0-8 Units Subcutaneous TID WC  . insulin glargine  5 Units Subcutaneous Daily  . ketorolac  1 drop Right Eye QID  . loratadine  10 mg Oral Daily  . pravastatin  40 mg Oral Daily  . prednisoLONE acetate  1 drop Right Eye QID  . sodium chloride flush  3 mL Intravenous Q12H  . trimethoprim-polymyxin b  1 drop Both Eyes QID   Continuous Infusions: . sodium chloride    . sodium chloride    .  ceFAZolin (ANCEF) IV    . ceFEPime (MAXIPIME) IV Stopped (05/08/17 2130)  . vancomycin Stopped (05/08/17 1627)     LOS: 5 days    Time spent: 35 mins    Irine Seal, MD [facility] Pager [phone]  If 7PM-7AM, please contact night-coverage [email or web address] Password [email or web address] 05/09/2017, 8:02 AM

## 2017-05-09 NOTE — Progress Notes (Signed)
PT Cancellation Note  Patient Details Name: MALINA GEERS MRN: [id number] DOB: 12/05/1970   Cancelled Treatment:    Reason Eval/Treat Not Completed: Medical issues which prohibited therapy(pt to transfer to [facility] today for BKA. MD, please re-order PT following surgery. )   Philomena Doheny 05/09/2017, 11:45 AM [phone]

## 2017-05-09 NOTE — Progress Notes (Signed)
Patient ID: [name], female   DOB: December 16, 1970, 47 y.o.   MRN: [id number]          [facility] for Infectious Disease    Date of Admission:  05/04/2017    Day 5 vancomycin        Day 5 cefepime  [name] was transferred to [facility] this afternoon in anticipation of possible left BKA tomorrow.  Her nurse told me that [name] is still undecided about whether or not to have surgery here or get a second opinion about the ERTL procedure done by an orthopedic surgeon in [place].  Operative Gram stain here shows mixed bacteria.  Cultures have only grown group B strep so far but final cultures are pending.  She has grown Pseudomonas, MRSA, Serratia and mixed bacterial flora from recent cultures of her right foot.  I will continue current antibiotics pending final cultures and her decision about surgery.  I will ask my partner, Dr. Tommy Medal, to see her at [facility] tomorrow.         Michel Bickers, MD [facility] for Infectious [place] [facility] [phone] pager   [phone] cell 05/09/2017, 3:22 PM

## 2017-05-09 NOTE — Progress Notes (Signed)
Inpatient Diabetes Program Recommendations  AACE/ADA: New Consensus Statement on Inpatient Glycemic Control (2015)  Target Ranges:  Prepandial:   less than 140 mg/dL      Peak postprandial:   less than 180 mg/dL (1-2 hours)      Critically ill patients:  140 - 180 mg/dL   Lab Results  Component Value Date   GLUCAP 432 (H) 05/09/2017   HGBA1C 10.6 04/08/2017    Review of Glycemic Control Results for [name], [name] (MRN [id number]) as of 05/09/2017 09:11  Ref. Range 05/08/2017 17:11 05/08/2017 21:13 05/09/2017 03:28 05/09/2017 06:21 05/09/2017 08:09  Glucose-Capillary Latest Ref Range: 65 - 99 mg/dL 374 (H) 380 (H) 372 (H)  432 (H)   Diabetes history:Type 1 DM Outpatient Diabetes medications:Humalog SSI, Lantus 12 Units in AM, 8 Units in PM Current orders for Inpatient glycemic control:Novolog 0-8 TID, Lantus 10 Units QHS, Novolog 8 Units x1   Inpatient Diabetes Program Recommendations:    Noted increases to blood sugars and increase made to Lantus to 10 units QD. Also, of note is the Novolog 8 Units x1, patient refused. Given previous blood sugar pattern, this would have caused hypoglycemic event. Please when ordering Novolog consider low doses, as 1 Units drops 60mg /dL!  Noted patient is to be transferred to [facility]. Could consider IV insulin during surgery for improved control for prei-op.   Thanks, Bronson Curb, MSN, RNC-OB Diabetes Coordinator [phone] (8a-5p)

## 2017-05-09 NOTE — Progress Notes (Signed)
OT Cancellation/Discharge Note  Patient Details Name: [name] MRN: [id number] DOB: Nov 07, 1970   Cancelled Treatment:    Reason Eval/Treat Not Completed: Medical issues which prohibited therapy.  Pt with plan for BKA today and transfer to [facility].  MD, please reorder OT post surgery.  Thank, you.    [place], OTR/L [phone]    Lucille Passy M 05/09/2017, 9:28 AM

## 2017-05-10 ENCOUNTER — Telehealth: Payer: Self-pay | Admitting: Endocrinology

## 2017-05-10 ENCOUNTER — Encounter: Payer: Self-pay | Admitting: Podiatry

## 2017-05-10 DIAGNOSIS — I96 Gangrene, not elsewhere classified: Secondary | ICD-10-CM

## 2017-05-10 DIAGNOSIS — E7800 Pure hypercholesterolemia, unspecified: Secondary | ICD-10-CM

## 2017-05-10 DIAGNOSIS — E1052 Type 1 diabetes mellitus with diabetic peripheral angiopathy with gangrene: Secondary | ICD-10-CM

## 2017-05-10 DIAGNOSIS — I10 Essential (primary) hypertension: Secondary | ICD-10-CM

## 2017-05-10 DIAGNOSIS — E11628 Type 2 diabetes mellitus with other skin complications: Secondary | ICD-10-CM

## 2017-05-10 DIAGNOSIS — D649 Anemia, unspecified: Secondary | ICD-10-CM

## 2017-05-10 DIAGNOSIS — L089 Local infection of the skin and subcutaneous tissue, unspecified: Secondary | ICD-10-CM

## 2017-05-10 DIAGNOSIS — B951 Streptococcus, group B, as the cause of diseases classified elsewhere: Secondary | ICD-10-CM

## 2017-05-10 DIAGNOSIS — L97528 Non-pressure chronic ulcer of other part of left foot with other specified severity: Secondary | ICD-10-CM

## 2017-05-10 DIAGNOSIS — R7881 Bacteremia: Secondary | ICD-10-CM

## 2017-05-10 DIAGNOSIS — E10621 Type 1 diabetes mellitus with foot ulcer: Secondary | ICD-10-CM

## 2017-05-10 LAB — CULTURE, BLOOD (ROUTINE X 2)
CULTURE: NO GROWTH
Culture: NO GROWTH
SPECIAL REQUESTS: ADEQUATE
Special Requests: ADEQUATE

## 2017-05-10 LAB — BASIC METABOLIC PANEL
ANION GAP: 10 (ref 5–15)
BUN: 43 mg/dL — ABNORMAL HIGH (ref 6–20)
CALCIUM: 7.2 mg/dL — AB (ref 8.9–10.3)
CHLORIDE: 111 mmol/L (ref 101–111)
CO2: 17 mmol/L — AB (ref 22–32)
CREATININE: 2.15 mg/dL — AB (ref 0.44–1.00)
GFR calc non Af Amer: 27 mL/min — ABNORMAL LOW (ref >60)
GFR, EST AFRICAN AMERICAN: 31 mL/min — AB (ref >60)
Glucose, Bld: 261 mg/dL — ABNORMAL HIGH (ref 65–99)
Potassium: 4.3 mmol/L (ref 3.5–5.1)
SODIUM: 138 mmol/L (ref 135–145)

## 2017-05-10 LAB — CBC WITH DIFFERENTIAL/PLATELET
BASOS ABS: 0 10*3/uL (ref 0.0–0.1)
BASOS PCT: 0 %
EOS ABS: 0.2 10*3/uL (ref 0.0–0.7)
Eosinophils Relative: 2 %
HEMATOCRIT: 25.3 % — AB (ref 36.0–46.0)
HEMOGLOBIN: 8.2 g/dL — AB (ref 12.0–15.0)
Lymphocytes Relative: 21 %
Lymphs Abs: 1.7 10*3/uL (ref 0.7–4.0)
MCH: 26.7 pg (ref 26.0–34.0)
MCHC: 32.4 g/dL (ref 30.0–36.0)
MCV: 82.4 fL (ref 78.0–100.0)
Monocytes Absolute: 0.8 10*3/uL (ref 0.1–1.0)
Monocytes Relative: 10 %
NEUTROS ABS: 5.4 10*3/uL (ref 1.7–7.7)
NEUTROS PCT: 67 %
Platelets: 603 10*3/uL — ABNORMAL HIGH (ref 150–400)
RBC: 3.07 MIL/uL — ABNORMAL LOW (ref 3.87–5.11)
RDW: 16.4 % — AB (ref 11.5–15.5)
WBC: 8.1 10*3/uL (ref 4.0–10.5)

## 2017-05-10 LAB — GLUCOSE, CAPILLARY
GLUCOSE-CAPILLARY: 216 mg/dL — AB (ref 65–99)
Glucose-Capillary: 345 mg/dL — ABNORMAL HIGH (ref 65–99)

## 2017-05-10 MED ORDER — AMLODIPINE BESYLATE 5 MG PO TABS: 5.0000 mg | ORAL_TABLET | Freq: Every day | ORAL | 0 refills | Status: DC

## 2017-05-10 MED ORDER — INSULIN GLARGINE 100 UNIT/ML SOLOSTAR PEN: 12.0000 [IU] | PEN_INJECTOR | Freq: Every day | SUBCUTANEOUS | 0 refills | Status: DC

## 2017-05-10 MED ORDER — CIPROFLOXACIN HCL 250 MG PO TABS: 250.0000 mg | ORAL_TABLET | Freq: Two times a day (BID) | ORAL | 0 refills | Status: DC

## 2017-05-10 MED ORDER — LEVOFLOXACIN 500 MG PO TABS
500.0000 mg | ORAL_TABLET | Freq: Once | ORAL | Status: AC
  Administered 2017-05-10: 500 mg via ORAL
  Filled 2017-05-10: qty 1

## 2017-05-10 MED ORDER — AMLODIPINE BESYLATE 5 MG PO TABS
5.0000 mg | ORAL_TABLET | Freq: Every day | ORAL | Status: DC
  Administered 2017-05-10: 5 mg via ORAL
  Filled 2017-05-10: qty 1

## 2017-05-10 MED ORDER — LEVOFLOXACIN 250 MG PO TABS: 250.0000 mg | ORAL_TABLET | Freq: Two times a day (BID) | ORAL | 0 refills | Status: AC

## 2017-05-10 MED ORDER — LORATADINE 10 MG PO TABS: 10.0000 mg | ORAL_TABLET | Freq: Every day | ORAL | 0 refills | Status: DC

## 2017-05-10 MED ORDER — OXYCODONE-ACETAMINOPHEN 5-325 MG PO TABS: 1.0000 | ORAL_TABLET | ORAL | 0 refills | Status: DC | PRN

## 2017-05-10 MED ORDER — METRONIDAZOLE 500 MG PO TABS: 500.0000 mg | ORAL_TABLET | Freq: Three times a day (TID) | ORAL | 0 refills | Status: DC

## 2017-05-10 MED ORDER — CIPROFLOXACIN HCL 500 MG PO TABS: 500.0000 mg | ORAL_TABLET | Freq: Once | ORAL | Status: DC

## 2017-05-10 MED ORDER — METRONIDAZOLE 500 MG PO TABS: 500.0000 mg | ORAL_TABLET | Freq: Three times a day (TID) | ORAL | 0 refills | Status: AC

## 2017-05-10 NOTE — Progress Notes (Signed)
Date: May 10, 2017 Velva Harman, BSN, [place], [place] Chart and notes review for patient progress and needs. Poss surg for amputation of the toe and area affected/iv abxs. [facility]. Will follow for case management and discharge needs. No cm or discharge needs present at time of this review. Next review date: [id number]

## 2017-05-10 NOTE — Progress Notes (Signed)
Patient given discharge instructions, and verbalized an understanding of all discharge instructions.  Patient agrees with discharge plan, and is being discharged in stable medical condition.  Patient given transportation via wheelchair. 

## 2017-05-10 NOTE — Progress Notes (Signed)
Inpatient Diabetes Program Recommendations  AACE/ADA: New Consensus Statement on Inpatient Glycemic Control (2015)  Target Ranges:  Prepandial:   less than 140 mg/dL      Peak postprandial:   less than 180 mg/dL (1-2 hours)      Critically ill patients:  140 - 180 mg/dL   Called by RN per patient request. Patient received new insulin pump in the mail 2 weeks ago and wanted to know if DM Coordinator could set up her pump with insulin pump settings from her Endocrinology office. See in chart where she has already tried to get insulin pump settings posted in [place].   Spoke with patient that there is a specific pump trainer who she needs to make an appointment with in her Endocrinologist office that will not only program her pump, but also show her how to navigate and operate her pump.   Told patient that the inpatient DM Coordinators know enough to assist patients in retrieving settings for inpatient dosing, however we are not experts in the full operation of insulin pumps as there are many models.  Thanks,  Tama Headings RN, MSN, [facility] Inpatient Diabetes Coordinator Team Pager [phone] (8a-5p)

## 2017-05-10 NOTE — Discharge Summary (Signed)
Physician Discharge Summary  [name] SEG:[id number] DOB: 12/28/1970 DOA: 05/04/2017  PCP: Patient, No Pcp Per  Admit date: 05/04/2017 Discharge date: 05/10/2017  Time spent: 60 minutes  Recommendations for Outpatient Follow-up:  1. Follow-up with Dr. Megan Salon, infectious disease in 1 week. 2. Follow-up with [name], podiatry in 1 week. 3. Follow-up with orthopedic surgeon in [place] as soon as able to.   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  Diet recommendation: Carb modified diet.  Filed Weights   05/04/17 1645 05/06/17 1930 05/09/17 1407  Weight: 57.6 kg (127 lb) 57.6 kg (127 lb) 57.6 kg (127 lb)    History of present illness:  Per Dr. Claria Dice  this is a 47 y/o with h/o DM and Diabetic foot ulcer and OM d/c antibiotics approx 1 month ago. Saw ID on Tuesday. Friday saw a blister on left foot above wound. On day of admission,  blister was very large. She denied pain. There was now increased redness around calf and ankles. The day prior to admission, Tmax 103.2. She had nausea and vomiting. She reported chills.   She has had B/L LE ulcers which are cared for by visiting home wound nurse. intrum home health. She is seen M/W/F. She has a h/o MRSA wound infection.  She has had a recent h/o MRSA osteomyelitis. She was treated with linezoid, cipro and flagyl for 7 weeks. Antibiotics d/ced approx 6 weeks ago  History provided by patient who is alert and oriented    Hospital Course:   #1 left gangrenous foot/osteomyelitis left foot/cellulitis left foot/left foot cellulitis Patient presented initially with cellulitis/left diabetic foot ulcer noted to have a gangrenous left foot with osteomyelitis of the left foot noted on MRI of the fourth metatarsal.  Patient status post incision and drainage per [name], podiatry 05/06/2017 with preliminary results of polymicrobial flora with final cultures pending.  Patient maintained empirically on IV vancomycin and IV cefepime per ID recommendations.  Podiatry recommended orthopedics consultation for BKA.  Patient had been assessed by Dr.Xu of orthopedics who feels patient foot is not salvageable at this point, MRI consistent with osteomyelitis recommendation is for BKA.  Orthopedics requesting patient be transferred to [facility] for probable surgery 05/09/2017.  Patient was transferred to [facility] to the operating room at which point in time patient decided against BKA and had wanted to undergo an Ertl amputation by orthopedic surgeons office in [place].  Orthopedics spoke with patient extensively about postponing BKA and knowing the potential that she could become septic in the meantime.  It was noted that if patient became septic in the meantime she will need to undergo a BKA more urgently.  Patient was aware of potential risk of delaying surgery and transferred back to the floor.  Patient will be discharged on a 1 month supply of Levaquin and Flagyl as recommended by infectious diseases.  Patient be discharged on wet-to-dry dressing changes daily and will follow up with [name], podiatry 1  week post discharge in the interim while awaiting to undergo Ertl amputation in [place]..  2.  Poorly controlled diabetes mellitus type 1 Hemoglobin A1c 10.5.  Patient noted to be a brittle diabetic and has had some hypoglycemic spells during the hospitalization.  Patient's Lantus doses were adjusted during the hospitalization  secondary to some hypoglycemic episodes.  Patient was on Lantus 10 units daily as well as sliding scale insulin.  Patient be discharged home on Lantus 12 units daily as well as her meal coverage insulin.  Outpatient follow-up.  3.  Hyperlipidemia Patient maintained on home regimen of statin.   4.  Hypertension Patient was on home regimen of Norvasc 2.5 mg daily which was uptitrated to 5 mg daily for better blood pressure control.    5.  Moderate protein calorie malnutrition Nutritional supplementation.  6.  Chronic kidney disease stage III Stable throughout the hospitalization.      Procedures:  Chest x-ray 05/04/2017  Plain films of the left foot 05/05/2017  MRI left foot 05/06/2017  Incision and drainage left foot per [name], podiatry 05/06/2017      Consultations:  Podiatry: [name] 05/06/2017  Infectious disease: Dr. Johnnye Sima 05/05/2017  Wound care: Cristy Hilts, RN 05/05/2017  Orthopedics: Dr.Xu 05/08/2017      Discharge Exam: Vitals:   05/09/17 2358 05/10/17 0447  BP: (!) 148/71 (!) 158/82  Pulse: 87 81  Resp: 17 18  Temp: 98.8 F (37.1 C) 98.6 F (37 C)  SpO2: 97% 100%    General: NAD Cardiovascular: RRR Respiratory: CTAB  Discharge Instructions   Discharge Instructions    Diet Carb Modified   Complete by:  As directed    Increase activity slowly   Complete by:  As directed      Allergies as of 05/10/2017      Reactions   Cleocin [clindamycin Hcl] Diarrhea   Diarrhea    Lisinopril Other (See Comments)   Elevated potassium per pt report   Amoxicillin Diarrhea, Nausea Only   Has patient had a PCN reaction causing immediate rash, facial/tongue/throat swelling, SOB or lightheadedness with hypotension: No Has patient had a PCN reaction causing severe rash involving mucus membranes or skin necrosis: No Has patient had a PCN reaction that required hospitalization: No Has patient had a PCN reaction occurring within the last 10 years: No If all  of the above answers are "NO", then may proceed with Cephalosporin use.   Bactrim [sulfamethoxazole-trimethoprim] Diarrhea, Nausea Only      Medication List    STOP taking these medications   ciprofloxacin 500 MG tablet Commonly known as:  CIPRO   traMADol 50 MG tablet Commonly known as:  ULTRAM     TAKE these medications   albuterol 108 (90 Base) MCG/ACT inhaler Commonly known as:  PROVENTIL HFA;VENTOLIN HFA Inhale 2 puffs into the lungs every 6 (six) hours as needed for wheezing or shortness of breath. Reported on 04/15/2015   amLODipine 5 MG tablet Commonly known as:  NORVASC Take 1 tablet (5 mg total) by mouth daily. What changed:    medication strength  how much to take   aspirin 325 MG tablet Take 325 mg by mouth daily.   brimonidine 0.2 % ophthalmic solution Commonly known as:  ALPHAGAN Place 1 drop into the right eye 2 (two) times daily. Surgery scheduled for 02/05, meds to be started 02/03   calcitRIOL 0.5 MCG capsule Commonly known as:  ROCALTROL Take 0.5 mcg by mouth daily.   CALCIUM GUMMIES [phone]  MG-MG-UNIT Chew Generic drug:  Calcium-Phosphorus-Vitamin D Chew 2 each by mouth daily.   CINNAMON PO Take 1 tablet by mouth daily.   CONTOUR NEXT TEST test strip Generic drug:  glucose blood USE AS INSTRUCTED TO CHECK BLOOD SUGAR 4 TIMES A DAY. DX E10.65   cyclopentolate 1 % ophthalmic solution Commonly known as:  CYCLODRYL,CYCLOGYL Place 1 drop into the right eye 3 (three) times daily. Surgery scheduled for 02/05, meds to be started 95/62   folic acid 1 MG tablet Commonly known as:  FOLVITE Take 1 mg by mouth daily.   FREESTYLE LIBRE READER Devi 1 Device by Does not apply route as directed.   FREESTYLE LIBRE SENSOR SYSTEM Misc Use 1 sensor every 10 days   furosemide 40 MG tablet Commonly known as:  LASIX Take 40 mg by mouth daily as needed for fluid.   gabapentin 300 MG capsule Commonly known as:  NEURONTIN Take 1 capsule (300 mg total)  by mouth 2 (two) times daily. What changed:    when to take this  reasons to take this   HUMALOG 100 UNIT/ML injection Generic drug:  insulin lispro CALL MD IF <70, IF 151-200 =2UNITS,201-250 =4 UNITS,251-300 =6 UNITS, 301-350 =8 UNITS,351-400 =10 U   Insulin Glargine 100 UNIT/ML Solostar Pen Commonly known as:  LANTUS SOLOSTAR Inject 12 Units into the skin daily at 10 pm. What changed:    how much to take  how to take this  when to take this  additional instructions   ketorolac 0.5 % ophthalmic solution Commonly known as:  ACULAR Place 1 drop into the right eye 4 (four) times daily. Start 1 week prior to surgery scheduled for 02/05   levofloxacin 250 MG tablet Commonly known as:  LEVAQUIN Take 1 tablet (250 mg total) by mouth 2 (two) times daily.   loratadine 10 MG tablet Commonly known as:  CLARITIN Take 1 tablet (10 mg total) by mouth daily. Start taking on:  05/11/2017   metroNIDAZOLE 500 MG tablet Commonly known as:  FLAGYL Take 1 tablet (500 mg total) by mouth 3 (three) times daily.   ondansetron 4 MG disintegrating tablet Commonly known as:  ZOFRAN-ODT DISSOLVE 1 TABLET ON TONGUE EVERY 8 HOURS AS NEEDED FOR NAUSEA   oxyCODONE-acetaminophen 5-325 MG tablet Commonly known as:  PERCOCET/ROXICET Take 1-2 tablets by mouth every 4 (four) hours as needed for severe pain.   Pen Needles 32G X 4 MM Misc 1 Device by Does not apply route 2 (two) times daily.   pravastatin 40 MG tablet Commonly known as:  PRAVACHOL Take 1 tablet (40 mg total) by mouth daily.   prednisoLONE acetate 1 % ophthalmic suspension Commonly known as:  PRED FORTE Place 1 drop into the right eye 4 (four) times daily. Surgery scheduled for 02/05, meds to be started 02/05 after surgery   Probiotic Caps Take 1 capsule by mouth daily.   pyridoxine 100 MG tablet Commonly known as:  B-6 Take 100 mg by mouth daily. Reported on 04/15/2015   trimethoprim-polymyxin b ophthalmic solution Commonly  known as:  POLYTRIM Place 1 drop into both eyes 4 (four) times daily. Surgery scheduled for 02/05, meds to be started 02/03   vitamin C 500 MG tablet Commonly known as:  ASCORBIC ACID Take 500 mg by mouth daily.      Allergies  Allergen Reactions  . Cleocin [Clindamycin Hcl] Diarrhea    Diarrhea   . Lisinopril Other (See Comments)    Elevated potassium per pt report  .  Amoxicillin Diarrhea and Nausea Only    Has patient had a PCN reaction causing immediate rash, facial/tongue/throat swelling, SOB or lightheadedness with hypotension: No Has patient had a PCN reaction causing severe rash involving mucus membranes or skin necrosis: No Has patient had a PCN reaction that required hospitalization: No Has patient had a PCN reaction occurring within the last 10 years: No If all of the above answers are "NO", then may proceed with Cephalosporin use.   . Bactrim [Sulfamethoxazole-Trimethoprim] Diarrhea and Nausea Only   Follow-up Information    Michel Bickers, MD. Schedule an appointment as soon as possible for a visit in 1 week(s).   Specialty:  Infectious Diseases Contact information: [address] [place] [address] [phone]        Edrick Kins, DPM. Schedule an appointment as soon as possible for a visit in 1 week(s).   Specialty:  Podiatry Contact information: [address] [place] [address] [phone]        MD Hindman. Schedule an appointment as soon as possible for a visit.   Why:  F/U AS SOON AS POSSIBLE           The results of significant diagnostics from this hospitalization (including imaging, microbiology, ancillary and laboratory) are listed below for reference.    Significant Diagnostic Studies: Dg Chest 2 View  Result Date: 05/04/2017 CLINICAL DATA:  Patient reports fever of 103.2 last night with nausea and vomiting. EXAM: CHEST  2 VIEW COMPARISON:  09/29/2016 FINDINGS: The heart size and mediastinal  contours are within normal limits. Both lungs are clear. Chronic right fourth rib fracture. No acute osseous appearing abnormality. IMPRESSION: No active cardiopulmonary disease. Electronically Signed   By: Ashley Royalty M.D.   On: 05/04/2017 18:38   Mr Foot Left Wo Contrast  Result Date: 05/06/2017 CLINICAL DATA:  Large hemorrhagic blister on the plantar aspect of the foot. Abnormal x-rays EXAM: MRI OF THE LEFT FOOT WITHOUT CONTRAST TECHNIQUE: Multiplanar, multisequence MR imaging of the left foot was performed. No intravenous contrast was administered. COMPARISON:  Radiographs 05/04/2017 FINDINGS: Abnormal T1 and T2 signal intensity in the mid and proximal phalanges of the fourth digit along with mild edema like signal abnormality in the third and fourth metatarsals. Findings worrisome for osteomyelitis. The fifth ray has been resected. There is fluid/phlegmon and gas in the soft tissues adjacent to the fourth phalanx. There is also the fluid collection adjacent to the fourth metatarsal head and neck which measures 18.4 x 15.0 x 11.5 mm and is most consistent with an abscess. On the plantar aspect of the foot is a large skin blister. Along its distal aspect there appears to be an open wound. There is significant gas in the plantar soft tissues. There is also signal abnormality surrounding the flexor tendon of the fourth toe with a probable small abscess and gas. Diffuse cellulitis and myofasciitis. IMPRESSION: 1. Abscess adjacent to the fourth metatarsal head and neck. Suspect small abscess also surrounding the fourth flexor tendon. 2. Inflammatory phlegmon and gas in the soft tissues along the lateral aspect of the fourth phalanx without discrete drainable abscess. 3. Findings worrisome for osteomyelitis involving the fourth proximal and middle phalanges and also the third and fourth metatarsals. 4. Large skin blister on the plantar aspect of the foot with adjacent gas in the soft tissues. 5. Cellulitis and  myofasciitis. Electronically Signed   By: Marijo Sanes M.D.   On: 05/06/2017 15:17  Dg Foot Complete Left  Result Date: 05/04/2017 CLINICAL DATA:  New blister EXAM: LEFT FOOT - COMPLETE 3+ VIEW COMPARISON:  Numerous priors dating back through 10/15/2014. FINDINGS: Diffuse osteopenia of the left foot with slight rocker bottom appearance of the midfoot on the lateral view. 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 Soft tissue swelling is seen along the lateral aspect of the forefoot with soft tissue emphysema at the base of the fourth toe. There is a soft tissue ulceration along the plantar lateral aspect of the midfoot. No loss of cortical margins to suggest osteomyelitis. Midfoot osteoarthritis is stable. Bony protuberance along the posteromedial aspect of the navicular is also stable. Generalized soft tissue swelling about the malleoli and included distal leg. No acute fracture or suspicious osseous lesions. IMPRESSION: 1. Soft tissue ulceration along the plantar lateral aspect of the midfoot with soft tissue emphysema along the plantar lateral aspect of the forefoot and extending along the fourth digit. 2. No radiographic manifestation of osteomyelitis. 3. 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 4. 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 Electronically Signed   By: Ashley Royalty M.D.   On: 05/04/2017 18:49    Microbiology: Recent Results (from the past 240 hour(s))  Blood Culture (routine x 2)     Status: None   Collection Time: 05/04/17  6:00 PM  Result Value Ref Range Status   Specimen Description   Final    BLOOD LEFT HAND Performed at [facility], [address], [place], [place] [address]    Special Requests   Final    IN PEDIATRIC BOTTLE Blood Culture adequate volume Performed at [facility], [address] [place], [place] [address]    Culture   Final    NO  GROWTH 5 DAYS Performed at [facility], [place] [address], [place], [place] [address]    Report Status 05/10/2017 FINAL  Final  Blood Culture (routine x 2)     Status: None   Collection Time: 05/04/17  6:50 PM  Result Value Ref Range Status   Specimen Description   Final    BLOOD LEFT ARM Performed at [facility], [place] [address] [place], [place] [address]    Special Requests   Final    BOTTLES DRAWN AEROBIC AND ANAEROBIC Blood Culture adequate volume Performed at [facility], [address] [place], [place] [address]    Culture   Final    NO GROWTH 5 DAYS Performed at [facility], [place] [address] [place], [place] [address]    Report Status 05/10/2017 FINAL  Final  MRSA PCR Screening     Status: None   Collection Time: 05/06/17  6:14 AM  Result Value Ref Range Status   MRSA by PCR NEGATIVE NEGATIVE Final    Comment:        The GeneXpert MRSA Assay (FDA approved for NASAL specimens only), is one component of a comprehensive MRSA colonization surveillance program. It is not intended to diagnose MRSA infection nor to guide or monitor treatment for MRSA infections. Performed at [facility], [place] [address] [place], [place] [address]   Aerobic/Anaerobic Culture (surgical/deep wound)     Status: None (Preliminary result)   Collection Time: 05/06/17  9:03 PM  Result Value Ref Range Status   Specimen Description   Final  ABSCESS LEFT FOOT Performed at [place] [address] [place], [place] [address]    Special Requests   Final    NONE Performed at [facility], [place] [address], [place], [place] [address]    Gram Stain   Final    ABUNDANT WBC PRESENT,BOTH PMN AND MONONUCLEAR MODERATE GRAM POSITIVE COCCI FEW GRAM NEGATIVE RODS    Culture   Final    MODERATE GROUP B STREP(S.AGALACTIAE)ISOLATED TESTING AGAINST S. AGALACTIAE NOT ROUTINELY PERFORMED DUE TO PREDICTABILITY OF  AMP/PEN/VAN SUSCEPTIBILITY. HOLDING FOR POSSIBLE ANAEROBE Performed at [facility], [place] [address], [place], [place] [address]    Report Status PENDING  Incomplete     Labs: Basic Metabolic Panel: Recent Labs  Lab 05/06/17 0813 05/07/17 0650 05/08/17 0612 05/09/17 0621 05/10/17 0953  NA 136 136 133* 134* 138  K 4.4 5.3* 4.4 3.9 4.3  CL 108 107 105 106 111  CO2 20* 18* 19* 19* 17*  GLUCOSE 105* 226* 151* 414* 261*  BUN 40* 35* 44* 48* 43*  CREATININE 2.16* 2.17* 2.10* 2.25* 2.15*  CALCIUM 7.4* 7.2* 7.0* 6.7* 7.2*   Liver Function Tests: Recent Labs  Lab 05/04/17 1848  AST 34  ALT 41  ALKPHOS 300*  BILITOT 0.3  PROT 7.8  ALBUMIN 2.8*   No results for input(s): LIPASE, AMYLASE in the last 168 hours. No results for input(s): AMMONIA in the last 168 hours. CBC: Recent Labs  Lab 05/04/17 1848  05/06/17 0813 05/07/17 0650 05/08/17 0612 05/09/17 0621 05/10/17 0953  WBC 18.9*   < > 12.1* 15.2* 7.5 8.3 8.1  NEUTROABS 16.8*  --   --   --   --   --  5.4  HGB 9.8*   < > 8.7* 8.4* 7.4* 7.4* 8.2*  HCT 29.8*   < > 26.9* 27.0* 23.3* 22.2* 25.3*  MCV 81.2   < > 82.3 84.4 83.2 81.0 82.4  PLT 539*   < > 522* 558* 498* 560* 603*   < > = values in this interval not displayed.   Cardiac Enzymes: No results for input(s): CKTOTAL, CKMB, CKMBINDEX, TROPONINI in the last 168 hours. BNP: BNP (last 3 results) No results for input(s): BNP in the last 8760 hours.  ProBNP (last 3 results) No results for input(s): PROBNP in the last 8760 hours.  CBG: Recent Labs  Lab 05/09/17 1149 05/09/17 1405 05/09/17 2022 05/10/17 0724 05/10/17 1114  GLUCAP 189* 173* 189* 345* 216*       Signed:  Irine Seal MD.  [facility] 05/10/2017, 3:19 PM

## 2017-05-10 NOTE — Telephone Encounter (Signed)
Called patient and she stated that she does not know how to change her pump settings and she has called and left a VM on [name]'s VM. I will try to see if Vaughan Basta can schedule her to come in to show her how to change the settings.

## 2017-05-10 NOTE — Discharge Instructions (Signed)
NWB L LE

## 2017-05-10 NOTE — Telephone Encounter (Signed)
Patient asking if you could send her, her insulin pump settings to her my chart.

## 2017-05-10 NOTE — Progress Notes (Signed)
Pharmacy Antibiotic Note  [name] is a 47 y.o. female admitted on 05/04/2017 with osteomyelitis, gangrene of left foot.  She recently completed 6 wks of antibiotics & has had previous wound cx growth during 2018 of pseudomonas, staph, MRSA & Serratia.   Pharmacy has been consulted for Vancomycin, cefepime dosing.  Patient needs amputation, however currently has chosen to continue IV antibiotics until can be evaluated by surgeon in [place] for Ertl procedure.  ID following for recommendations.   05/10/2017:  Day 6 antibiotics  Afebrile, no leukocytosis  Scr increased to 2.25 yesterday, est CrCl ~72ml/min  Wound cx growing Group B Strep  Plan: 1) Continue Vancomycin 1g IV q48 (goal AUC 400-500) 2) Continue cefepime 1g IV q24 for CrCl 11-29 ml/min 3) Monitor renal function and cx data 4) Will plan to check Vancomycin levels with Sunday's dose    Height: 5\' 7"  (170.2 cm) Weight: 127 lb (57.6 kg) IBW/kg (Calculated) : 61.6  Temp (24hrs), Avg:98.6 F (37 C), Min:98.4 F (36.9 C), Max:98.8 F (37.1 C)  Recent Labs  Lab 05/04/17 1902 05/05/17 0609 05/06/17 0813 05/07/17 0650 05/08/17 0612 05/09/17 0621  WBC  --  12.7* 12.1* 15.2* 7.5 8.3  CREATININE  --  2.58* 2.16* 2.17* 2.10* 2.25*  LATICACIDVEN 1.76  --   --   --   --   --     Estimated Creatinine Clearance: 28.7 mL/min (A) (by C-G formula based on SCr of 2.25 mg/dL (H)).    Allergies  Allergen Reactions  . Cleocin [Clindamycin Hcl] Diarrhea    Diarrhea   . Lisinopril Other (See Comments)    Elevated potassium per pt report  . Amoxicillin Diarrhea and Nausea Only    Has patient had a PCN reaction causing immediate rash, facial/tongue/throat swelling, SOB or lightheadedness with hypotension: No Has patient had a PCN reaction causing severe rash involving mucus membranes or skin necrosis: No Has patient had a PCN reaction that required hospitalization: No Has patient had a PCN reaction occurring within the  last 10 years: No If all of the above answers are "NO", then may proceed with Cephalosporin use.   . Bactrim [Sulfamethoxazole-Trimethoprim] Diarrhea and Nausea Only    Antimicrobials this admission: Vancomycin 05/04/2017 >> Cefepime 05/04/2017 >>   Microbiology results:  2/2 BCx: ngtd 2/4 MRSA PCR: negative 2/4 abscess left foot: mod group B strep  Thank you for allowing pharmacy to be a part of this patient's care.  Netta Cedars, PharmD, BCPS Pager: [phone] 05/10/2017 7:24 AM

## 2017-05-10 NOTE — Progress Notes (Addendum)
Patient ID: [name], female   DOB: 02/08/1971, 47 y.o.   MRN: [id number]         [facility]  Date of Admission:  05/04/2017           Day 6 vancomycin        Day 6 cefepime ASSESSMENT: She has a severe polymicrobial left diabetic foot infection and needs BKA. I discussed antibiotic options with her and will send her home on oral levofloxacin and metronidazole.  These have good bioavailability and she has tolerated them well in the past.  She is aware that she could have progression of her infection while on any IV antibiotic regimen given the delay in BKA.   PLAN: 1. Change antibiotics to oral levofloxacin 500 mg loading dose then 250 mg twice daily and metronidazole 500 mg 3 times daily (will prescribe 1 month of therapy) 2. 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 acidophilus  1 capsule Oral Daily  . amLODipine  5 mg Oral Daily  . aspirin  325 mg Oral Daily  . brimonidine  1 drop Right Eye BID  . calcitRIOL  0.5 mcg Oral Daily  . cyclopentolate  1 drop Right Eye TID  . enoxaparin (LOVENOX) injection  30 mg Subcutaneous QHS  . folic acid  1 mg Oral Daily  . insulin aspart  0-8 Units Subcutaneous TID WC  . insulin aspart  8 Units Subcutaneous Once  . insulin glargine  10 Units Subcutaneous Daily  . ketorolac  1 drop Right Eye QID  . loratadine  10 mg Oral Daily  . pravastatin  40 mg Oral Daily  . prednisoLONE acetate  1 drop Right Eye QID  . sodium chloride  flush  3 mL Intravenous Q12H  . trimethoprim-polymyxin b  1 drop Both Eyes QID   Continuous Infusions: . sodium chloride    . sodium chloride 75 mL/hr at 05/09/17 1202  . ceFEPime (MAXIPIME) IV Stopped (05/09/17 2025)  . vancomycin Stopped (05/08/17 1627)   PRN Meds:.sodium chloride, acetaminophen **OR** acetaminophen, guaiFENesin, HYDROcodone-acetaminophen, morphine injection, ondansetron **OR** ondansetron (ZOFRAN) IV, oxyCODONE-acetaminophen, polyethylene glycol, promethazine, sodium chloride flush   SUBJECTIVE: After transfer to [facility] yesterday she made the decision that she did not want to undergo BKA here.  She prefers to arrange follow-up in [place] where she can undergo the ERTL procedure.  She does not want to have a PICC line because of concerns about recurrent blood clots that she has had in the past.  Operative cultures are growing group B strep and anaerobes.    Review of Systems: Review of Systems  Constitutional: Negative for chills, diaphoresis and fever.  Gastrointestinal: Negative for abdominal pain, diarrhea, nausea and vomiting.  Musculoskeletal: Negative for joint pain.    Allergies  Allergen Reactions  . Cleocin [Clindamycin Hcl] Diarrhea    Diarrhea   . Lisinopril Other (See Comments)    Elevated potassium per pt report  .  Amoxicillin Diarrhea and Nausea Only    Has patient had a PCN reaction causing immediate rash, facial/tongue/throat swelling, SOB or lightheadedness with hypotension: No Has patient had a PCN reaction causing severe rash involving mucus membranes or skin necrosis: No Has patient had a PCN reaction that required hospitalization: No Has patient had a PCN reaction occurring within the last 10 years: No If all of the above answers are "NO", then may proceed with Cephalosporin use.   . Bactrim [Sulfamethoxazole-Trimethoprim] Diarrhea and Nausea Only    OBJECTIVE: Vitals:   05/09/17 1407 05/09/17 2024 05/09/17 2358 05/10/17 0447   BP: (!) 192/81 (!) 152/76 (!) 148/71 (!) 158/82  Pulse: 79 86 87 81  Resp: 18 19 17 18   Temp: 98.4 F (36.9 C) 98.4 F (36.9 C) 98.8 F (37.1 C) 98.6 F (37 C)  TempSrc: Oral Oral Oral Oral  SpO2: 99% 100% 97% 100%  Weight: 127 lb (57.6 kg)     Height: 5\' 7"  (1.702 m)      Body mass index is 19.89 kg/m.  Physical Exam  Constitutional:  She is in good spirits.  She is eating breakfast.  Musculoskeletal:  Her foot is dressed.    Lab Results Lab Results  Component Value Date   WBC 8.3 05/09/2017   HGB 7.4 (L) 05/09/2017   HCT 22.2 (L) 05/09/2017   MCV 81.0 05/09/2017   PLT 560 (H) 05/09/2017    Lab Results  Component Value Date   CREATININE 2.25 (H) 05/09/2017   BUN 48 (H) 05/09/2017   NA 134 (L) 05/09/2017   K 3.9 05/09/2017   CL 106 05/09/2017   CO2 19 (L) 05/09/2017    Lab Results  Component Value Date   ALT 41 05/04/2017   AST 34 05/04/2017   ALKPHOS 300 (H) 05/04/2017   BILITOT 0.3 05/04/2017     Microbiology: Recent Results (from the past 240 hour(s))  Blood Culture (routine x 2)     Status: None (Preliminary result)   Collection Time: 05/04/17  6:00 PM  Result Value Ref Range Status   Specimen Description   Final    BLOOD LEFT HAND Performed at [facility], [address] [place], [place] [address]    Special Requests   Final    IN PEDIATRIC BOTTLE Blood Culture adequate volume Performed at [facility], [address] [place], [place] [address]    Culture   Final    NO GROWTH 4 DAYS Performed at [facility], [place] [address], [place], [place] [address]    Report Status PENDING  Incomplete  Blood Culture (routine x 2)     Status: None (Preliminary result)   Collection Time: 05/04/17  6:50 PM  Result Value Ref Range Status   Specimen Description   Final    BLOOD LEFT ARM Performed at [facility], [place] [address], [place], [place] [address]    Special Requests   Final    BOTTLES DRAWN AEROBIC AND ANAEROBIC  Blood Culture adequate volume Performed at [facility], [place]., [place], [place] [address]    Culture   Final    NO GROWTH 4 DAYS Performed at [facility], [place] [address], [place], [place] [address]    Report Status PENDING  Incomplete  MRSA PCR Screening     Status: None   Collection Time: 05/06/17  6:14 AM  Result Value Ref Range Status   MRSA  by PCR NEGATIVE NEGATIVE Final    Comment:        The GeneXpert MRSA Assay (FDA approved for NASAL specimens only), is one component of a comprehensive MRSA colonization surveillance program. It is not intended to diagnose MRSA infection nor to guide or monitor treatment for MRSA infections. Performed at [facility], [place] [address], [place], [place] [address]   Aerobic/Anaerobic Culture (surgical/deep wound)     Status: None (Preliminary result)   Collection Time: 05/06/17  9:03 PM  Result Value Ref Range Status   Specimen Description   Final    ABSCESS LEFT FOOT Performed at [place] [address], [place], [place] [address]    Special Requests   Final    NONE Performed at [facility], [place] [address] [place], [place] [address]    Gram Stain   Final    ABUNDANT WBC PRESENT,BOTH PMN AND MONONUCLEAR MODERATE GRAM POSITIVE COCCI FEW GRAM NEGATIVE RODS    Culture   Final    MODERATE GROUP B STREP(S.AGALACTIAE)ISOLATED TESTING AGAINST S. AGALACTIAE NOT ROUTINELY PERFORMED DUE TO PREDICTABILITY OF AMP/PEN/VAN SUSCEPTIBILITY. HOLDING FOR POSSIBLE ANAEROBE Performed at [facility], [place] [address], [place],  [address]    Report Status PENDING  Incomplete    Michel Bickers, MD [facility] [place] [facility] [phone] pager   [phone] cell 05/10/2017, 10:09 AM

## 2017-05-10 NOTE — Telephone Encounter (Signed)
   Basal rate midnight = 0.55, 3 AM = 0.4, 4 PM = 0.55 and 9 PM = 0.65   Carb coverage 1: 15 g and correction 1: 60 with target 120, active insulin 6 hours

## 2017-05-10 NOTE — Telephone Encounter (Signed)
Please see message. °

## 2017-05-12 LAB — AEROBIC/ANAEROBIC CULTURE W GRAM STAIN (SURGICAL/DEEP WOUND)

## 2017-05-12 LAB — AEROBIC/ANAEROBIC CULTURE (SURGICAL/DEEP WOUND)

## 2017-05-13 ENCOUNTER — Encounter: Payer: Self-pay | Admitting: Internal Medicine

## 2017-05-13 ENCOUNTER — Other Ambulatory Visit: Payer: Self-pay | Admitting: Endocrinology

## 2017-05-13 ENCOUNTER — Telehealth: Payer: Self-pay

## 2017-05-13 DIAGNOSIS — E1065 Type 1 diabetes mellitus with hyperglycemia: Secondary | ICD-10-CM

## 2017-05-13 NOTE — Telephone Encounter (Signed)
Yeah just get her an appt to come in and follow up.  Thanks, [name]

## 2017-05-13 NOTE — Telephone Encounter (Signed)
Message left on machine for open appts. For tomorrowl  Told her to call me if she can make any one of those.

## 2017-05-13 NOTE — Telephone Encounter (Signed)
Pt. Scheduled for tomorrow at 2:30PM

## 2017-05-13 NOTE — Telephone Encounter (Signed)
Spoke with patient.  There are several issues. Please see phone note dated 05-13-17

## 2017-05-13 NOTE — Telephone Encounter (Signed)
[name], can you schedule [name] to come in and learn how to change her pump settings? She stated that she has forgotten how to adjust and she also was currently in the hospital and she was not sure if her settings were correct. Please see previous note from Dr. Dwyane Dee. Thank you!

## 2017-05-13 NOTE — Telephone Encounter (Signed)
Patient is calling regarding orders for wound care nurse.  She was receiving wound care services prior to hospital admission and requested orders to resume after discharge.   Patient is also confused about which antibiotic she should be taking. She was discharged on levaquin but Cipro was called to pharmacy and she was told to stop the levaquin . She is unsure where the new antibiotics came from . I asked her to check the prescription bottle and it was prescribed by Dr .Grandville Silos the hospitalist.    Patient wants to make sure Dr Megan Salon is aware of the antibiotic change and confirm this is the correct medication for her situation. I will forward message to Dr Megan Salon for review and return call to patient.  1.  Is Cipro the correct antibiotic? 2. Is Dr Megan Salon the correct physician for wound care orders?      Laverle Patter RN

## 2017-05-14 ENCOUNTER — Other Ambulatory Visit (INDEPENDENT_AMBULATORY_CARE_PROVIDER_SITE_OTHER): Payer: Medicare Other

## 2017-05-14 ENCOUNTER — Telehealth: Payer: Self-pay

## 2017-05-14 ENCOUNTER — Encounter: Payer: Medicare Other | Attending: Endocrinology | Admitting: Nutrition

## 2017-05-14 ENCOUNTER — Inpatient Hospital Stay: Payer: Medicare Other | Admitting: Internal Medicine

## 2017-05-14 ENCOUNTER — Encounter (HOSPITAL_BASED_OUTPATIENT_CLINIC_OR_DEPARTMENT_OTHER): Payer: Medicare Other | Attending: Internal Medicine

## 2017-05-14 DIAGNOSIS — Z87891 Personal history of nicotine dependence: Secondary | ICD-10-CM | POA: Diagnosis not present

## 2017-05-14 DIAGNOSIS — M86171 Other acute osteomyelitis, right ankle and foot: Secondary | ICD-10-CM | POA: Diagnosis not present

## 2017-05-14 DIAGNOSIS — L97419 Non-pressure chronic ulcer of right heel and midfoot with unspecified severity: Secondary | ICD-10-CM | POA: Diagnosis not present

## 2017-05-14 DIAGNOSIS — I129 Hypertensive chronic kidney disease with stage 1 through stage 4 chronic kidney disease, or unspecified chronic kidney disease: Secondary | ICD-10-CM | POA: Diagnosis not present

## 2017-05-14 DIAGNOSIS — D631 Anemia in chronic kidney disease: Secondary | ICD-10-CM | POA: Diagnosis not present

## 2017-05-14 DIAGNOSIS — N183 Chronic kidney disease, stage 3 unspecified: Secondary | ICD-10-CM

## 2017-05-14 DIAGNOSIS — I96 Gangrene, not elsewhere classified: Secondary | ICD-10-CM | POA: Insufficient documentation

## 2017-05-14 DIAGNOSIS — Z794 Long term (current) use of insulin: Secondary | ICD-10-CM | POA: Insufficient documentation

## 2017-05-14 DIAGNOSIS — E1052 Type 1 diabetes mellitus with diabetic peripheral angiopathy with gangrene: Secondary | ICD-10-CM | POA: Diagnosis not present

## 2017-05-14 DIAGNOSIS — Z86718 Personal history of other venous thrombosis and embolism: Secondary | ICD-10-CM | POA: Insufficient documentation

## 2017-05-14 DIAGNOSIS — E1040 Type 1 diabetes mellitus with diabetic neuropathy, unspecified: Secondary | ICD-10-CM | POA: Diagnosis not present

## 2017-05-14 DIAGNOSIS — Z89431 Acquired absence of right foot: Secondary | ICD-10-CM | POA: Insufficient documentation

## 2017-05-14 DIAGNOSIS — E1036 Type 1 diabetes mellitus with diabetic cataract: Secondary | ICD-10-CM | POA: Insufficient documentation

## 2017-05-14 DIAGNOSIS — E10621 Type 1 diabetes mellitus with foot ulcer: Secondary | ICD-10-CM | POA: Insufficient documentation

## 2017-05-14 DIAGNOSIS — E1022 Type 1 diabetes mellitus with diabetic chronic kidney disease: Secondary | ICD-10-CM | POA: Diagnosis not present

## 2017-05-14 DIAGNOSIS — E1069 Type 1 diabetes mellitus with other specified complication: Secondary | ICD-10-CM | POA: Diagnosis not present

## 2017-05-14 DIAGNOSIS — L97426 Non-pressure chronic ulcer of left heel and midfoot with bone involvement without evidence of necrosis: Secondary | ICD-10-CM | POA: Insufficient documentation

## 2017-05-14 DIAGNOSIS — Z713 Dietary counseling and surveillance: Secondary | ICD-10-CM | POA: Diagnosis present

## 2017-05-14 DIAGNOSIS — E1065 Type 1 diabetes mellitus with hyperglycemia: Secondary | ICD-10-CM | POA: Insufficient documentation

## 2017-05-14 DIAGNOSIS — N189 Chronic kidney disease, unspecified: Secondary | ICD-10-CM | POA: Insufficient documentation

## 2017-05-14 DIAGNOSIS — G4730 Sleep apnea, unspecified: Secondary | ICD-10-CM | POA: Insufficient documentation

## 2017-05-14 LAB — COMPREHENSIVE METABOLIC PANEL
ALT: 31 U/L (ref 0–35)
AST: 21 U/L (ref 0–37)
Albumin: 3.1 g/dL — ABNORMAL LOW (ref 3.5–5.2)
Alkaline Phosphatase: 365 U/L — ABNORMAL HIGH (ref 39–117)
BUN: 37 mg/dL — ABNORMAL HIGH (ref 6–23)
CALCIUM: 7.9 mg/dL — AB (ref 8.4–10.5)
CHLORIDE: 102 meq/L (ref 96–112)
CO2: 21 mEq/L (ref 19–32)
CREATININE: 2.23 mg/dL — AB (ref 0.40–1.20)
GFR: 25.2 mL/min — ABNORMAL LOW (ref >60.00)
Glucose, Bld: 368 mg/dL — ABNORMAL HIGH (ref 70–99)
POTASSIUM: 4.4 meq/L (ref 3.5–5.1)
Sodium: 134 mEq/L — ABNORMAL LOW (ref 135–145)
Total Bilirubin: 0.3 mg/dL (ref 0.2–1.2)
Total Protein: 7.3 g/dL (ref 6.0–8.3)

## 2017-05-14 NOTE — Telephone Encounter (Signed)
I will address these problems during her visit today.

## 2017-05-14 NOTE — Telephone Encounter (Signed)
Pt is aware she needs to call to see where she can get these filled for 4 times a day.

## 2017-05-14 NOTE — Telephone Encounter (Signed)
Pt has an appt with Christean Grief today and will be getting a sensor so the testing strip frequency will change at that time

## 2017-05-14 NOTE — Progress Notes (Signed)
I tried to show her how to put in settings, but patient said she will watch me do this, but does not want to do this herself.  Settings entered: Basal rate midnight = 0.55, 3 AM = 0.4, 4 PM = 0.55 and 9 PM = 0.65  Carb coverage 1: 15 g and correction 1: 60with target 120, active insulin 6 hours She had a cartridge filled, and she put it into the pump and primed it, and attached it to her upper left abdoment without any difficulty.  She reported that she had given her Lantus this morning at 9AM, but that her blood sugar was over 400. She did a correction injection in the waiting room, but does not remember how much this was for.  She was told that she should monitor her blood sugar readings q 2 hours for the next 12 hours, and she agreed to do this. If blood sugars drop, she can have a small meal of 30 carbs without a bolus.   She had no final questions.

## 2017-05-14 NOTE — Telephone Encounter (Signed)
Pt is on Medicare which only allows 3x a day testing, but pt is on the pump which means she needs to check 4x a day. Called pt to ask if she can get her test strips from where she gets her pump supplies. Pt did not answer. LMTCB

## 2017-05-15 LAB — VITAMIN D 25 HYDROXY (VIT D DEFICIENCY, FRACTURES): VITD: 14.01 ng/mL — ABNORMAL LOW (ref 30.00–100.00)

## 2017-05-15 LAB — FRUCTOSAMINE: Fructosamine: 410 umol/L — ABNORMAL HIGH (ref 0–285)

## 2017-05-15 NOTE — Telephone Encounter (Signed)
Patient did not come for scheduled appointment and has not rescheduled.  How would you like to proceed?

## 2017-05-15 NOTE — Patient Instructions (Addendum)
Test blood sugars q 2 hours until tomorrow morning.   No more Lantus insulin  Call if questions.

## 2017-05-16 DIAGNOSIS — E10621 Type 1 diabetes mellitus with foot ulcer: Secondary | ICD-10-CM | POA: Diagnosis not present

## 2017-05-16 LAB — GLUCOSE, CAPILLARY
GLUCOSE-CAPILLARY: 177 mg/dL — AB (ref 65–99)
GLUCOSE-CAPILLARY: 155 mg/dL — AB (ref 65–99)

## 2017-05-17 DIAGNOSIS — E10621 Type 1 diabetes mellitus with foot ulcer: Secondary | ICD-10-CM | POA: Diagnosis not present

## 2017-05-17 LAB — GLUCOSE, CAPILLARY
GLUCOSE-CAPILLARY: 223 mg/dL — AB (ref 65–99)
GLUCOSE-CAPILLARY: 136 mg/dL — AB (ref 65–99)

## 2017-05-20 ENCOUNTER — Other Ambulatory Visit: Payer: Medicare Other

## 2017-05-20 DIAGNOSIS — E10621 Type 1 diabetes mellitus with foot ulcer: Secondary | ICD-10-CM | POA: Diagnosis not present

## 2017-05-20 LAB — GLUCOSE, CAPILLARY
Glucose-Capillary: 188 mg/dL — ABNORMAL HIGH (ref 65–99)
Glucose-Capillary: 276 mg/dL — ABNORMAL HIGH (ref 65–99)

## 2017-05-20 NOTE — Telephone Encounter (Signed)
Please advise 

## 2017-05-21 ENCOUNTER — Telehealth: Payer: Self-pay

## 2017-05-21 DIAGNOSIS — E10621 Type 1 diabetes mellitus with foot ulcer: Secondary | ICD-10-CM | POA: Diagnosis not present

## 2017-05-21 LAB — GLUCOSE, CAPILLARY
GLUCOSE-CAPILLARY: 240 mg/dL — AB (ref 65–99)
GLUCOSE-CAPILLARY: 167 mg/dL — AB (ref 65–99)

## 2017-05-21 NOTE — Telephone Encounter (Signed)
Left message for patient to call our office for update on status of [place] referral for wound care and reschedule visit with Dr Megan Salon is needed.  She can be worked into schedule on Wednesday @ 9:45 am.    Laverle Patter, RN

## 2017-05-21 NOTE — Telephone Encounter (Signed)
Called patient to offer work in appointment with Dr Megan Salon on tomorrow as work in .  No answer/  Laverle Patter, RN

## 2017-05-22 ENCOUNTER — Encounter: Payer: Self-pay | Admitting: Endocrinology

## 2017-05-22 ENCOUNTER — Telehealth: Payer: Self-pay | Admitting: Endocrinology

## 2017-05-22 ENCOUNTER — Ambulatory Visit (INDEPENDENT_AMBULATORY_CARE_PROVIDER_SITE_OTHER): Payer: Medicare Other | Admitting: Endocrinology

## 2017-05-22 VITALS — BP 138/90 | HR 91 | Ht 67.0 in | Wt 129.0 lb

## 2017-05-22 DIAGNOSIS — E1121 Type 2 diabetes mellitus with diabetic nephropathy: Secondary | ICD-10-CM | POA: Diagnosis not present

## 2017-05-22 DIAGNOSIS — E1065 Type 1 diabetes mellitus with hyperglycemia: Secondary | ICD-10-CM

## 2017-05-22 DIAGNOSIS — E559 Vitamin D deficiency, unspecified: Secondary | ICD-10-CM | POA: Diagnosis not present

## 2017-05-22 DIAGNOSIS — E10621 Type 1 diabetes mellitus with foot ulcer: Secondary | ICD-10-CM | POA: Diagnosis not present

## 2017-05-22 LAB — GLUCOSE, CAPILLARY
GLUCOSE-CAPILLARY: 251 mg/dL — AB (ref 65–99)
Glucose-Capillary: 238 mg/dL — ABNORMAL HIGH (ref 65–99)

## 2017-05-22 MED ORDER — ERGOCALCIFEROL 1.25 MG (50000 UT) PO CAPS: 50000.0000 [IU] | ORAL_CAPSULE | ORAL | 2 refills | Status: DC

## 2017-05-22 NOTE — Telephone Encounter (Signed)
Patient want her medication sent to [id number] [name],  [address]

## 2017-05-22 NOTE — Progress Notes (Signed)
Patient ID: [name], female   DOB: 1970/12/07, 47 y.o.   MRN: [id number]   Reason for Appointment: follow-up of diabetes  History of Present Illness   Diagnosis: Type 1 DIABETES MELITUS  Previous history: She has had long-standing poorly controlled diabetes and typically has poor compliance with self care measures despite periodic diabetes education and periodic followup  INSULIN PUMP regimen:    Basal rate midnight = 0.55, 3 AM = 0.4, 4 PM = 0.55 and 9 PM = 0.65   Carb coverage 1: 15 g and correction 1: 60 with target 120, active insulin 6 hours         Recent history:   Her last A1c was 10.6 in January  She is checking her blood sugars up to 6 times a day,  limited by painful finger sticks  Current blood sugar patterns, difficulties with management and problems identified:  She was able to get her new pump only about a week ago  Her settings are the same as before  She is now going for hyperbaric treatments of her foot infection and checking blood sugars more in the mornings earlier and recently throughout the day and night also  Her blood sugars are consistently high with only one reading below 200 since  HIGHEST blood sugars are late at night but some in the late afternoon also  She disconnects her pump when she is going to her hyperbaric treatment center but yesterday she forgot to put back on for another few hours  Also she claims that her blood Sugar goes down when she is in the hyperbaric treatment she has not checked her sugar after coming back lately  Also despite taking boluses for high readings of blood sugars generally continue to be above target  Her sensitivity is now 1:60 with her target of 120  Her morning readings are THE lowest of the day still close to 200 and was about 300 this morning  She does not think she is eating late at night or during the night  She is not able to get the freestyle Libre even though she thinks she has  been approved   GLUCOSE readings from insulin pump download:  Mean values apply above for all meters except median for One Touch  PRE-MEAL Fasting Lunch Dinner Bedtime Overall  Glucose range: 192-400+  387  364-400+  212-400+    Mean/median:     387    POST-MEAL PC Breakfast PC Lunch PC Dinner  Glucose range:   ?    Mean/median:         Meals: eating at variable times and also having snacks periodically.  Her daily carbohydrate intake is quite variable Physical activity: exercise: none         Dietician visit: Most recent:?          Complications: are: Neuropathy, nephropathy, diabetic foot ulcer      Wt Readings from Last 3 Encounters:  05/22/17 129 lb (58.5 kg)  05/09/17 127 lb (57.6 kg)  05/02/17 127 lb (57.6 kg)   Lab Results  Component Value Date   HGBA1C 10.6 04/08/2017   HGBA1C 10.1 (H) 01/11/2017   HGBA1C 11.6 (H) 10/05/2016   Lab Results  Component Value Date   MICROALBUR 139.6 (H) 10/05/2016   LDLCALC 78 05/02/2016   CREATININE 2.23 (H) 05/14/2017    Other problems addressed:: See review of systems   Allergies as of 05/22/2017      Reactions   Cleocin [  clindamycin Hcl] Diarrhea   Diarrhea    Lisinopril Other (See Comments)   Elevated potassium per pt report   Amoxicillin Diarrhea, Nausea Only   Has patient had a PCN reaction causing immediate rash, facial/tongue/throat swelling, SOB or lightheadedness with hypotension: No Has patient had a PCN reaction causing severe rash involving mucus membranes or skin necrosis: No Has patient had a PCN reaction that required hospitalization: No Has patient had a PCN reaction occurring within the last 10 years: No If all of the above answers are "NO", then may proceed with Cephalosporin use.   Bactrim [sulfamethoxazole-trimethoprim] Diarrhea, Nausea Only      Medication List        Accurate as of 05/22/17  1:42 PM. Always use your most recent med list.          albuterol 108 (90 Base) MCG/ACT  inhaler Commonly known as:  PROVENTIL HFA;VENTOLIN HFA Inhale 2 puffs into the lungs every 6 (six) hours as needed for wheezing or shortness of breath. Reported on 04/15/2015   amLODipine 5 MG tablet Commonly known as:  NORVASC Take 1 tablet (5 mg total) by mouth daily.   aspirin 325 MG tablet Take 325 mg by mouth daily.   brimonidine 0.2 % ophthalmic solution Commonly known as:  ALPHAGAN Place 1 drop into the right eye 2 (two) times daily. Surgery scheduled for 02/05, meds to be started 02/03   calcitRIOL 0.5 MCG capsule Commonly known as:  ROCALTROL Take 0.5 mcg by mouth daily.   CALCIUM GUMMIES 250-100-500 MG-MG-UNIT Chew Generic drug:  Calcium-Phosphorus-Vitamin D Chew 2 each by mouth daily.   CINNAMON PO Take 1 tablet by mouth daily.   CONTOUR NEXT TEST test strip Generic drug:  glucose blood USE AS INSTRUCTED TO CHECK BLOOD SUGAR 4 TIMES A DAY. DX E10.65   cyclopentolate 1 % ophthalmic solution Commonly known as:  CYCLODRYL,CYCLOGYL Place 1 drop into the right eye 3 (three) times daily. Surgery scheduled for 02/05, meds to be started 56/21   folic acid 1 MG tablet Commonly known as:  FOLVITE Take 1 mg by mouth daily.   FREESTYLE LIBRE READER Devi 1 Device by Does not apply route as directed.   FREESTYLE LIBRE SENSOR SYSTEM Misc Use 1 sensor every 10 days   furosemide 40 MG tablet Commonly known as:  LASIX Take 40 mg by mouth daily as needed for fluid.   gabapentin 300 MG capsule Commonly known as:  NEURONTIN Take 1 capsule (300 mg total) by mouth 2 (two) times daily.   HUMALOG 100 UNIT/ML injection Generic drug:  insulin lispro CALL MD IF <70, IF 151-200 =2UNITS,201-250 =4 UNITS,251-300 =6 UNITS, 301-350 =8 UNITS,351-400 =10 U   Insulin Glargine 100 UNIT/ML Solostar Pen Commonly known as:  LANTUS SOLOSTAR Inject 12 Units into the skin daily at 10 pm.   ketorolac 0.5 % ophthalmic solution Commonly known as:  ACULAR Place 1 drop into the right eye 4  (four) times daily. Start 1 week prior to surgery scheduled for 02/05   levofloxacin 250 MG tablet Commonly known as:  LEVAQUIN Take 1 tablet (250 mg total) by mouth 2 (two) times daily.   loratadine 10 MG tablet Commonly known as:  CLARITIN Take 1 tablet (10 mg total) by mouth daily.   metroNIDAZOLE 500 MG tablet Commonly known as:  FLAGYL Take 1 tablet (500 mg total) by mouth 3 (three) times daily.   ondansetron 4 MG disintegrating tablet Commonly known as:  ZOFRAN-ODT DISSOLVE 1 TABLET ON TONGUE  EVERY 8 HOURS AS NEEDED FOR NAUSEA   oxyCODONE-acetaminophen 5-325 MG tablet Commonly known as:  PERCOCET/ROXICET Take 1-2 tablets by mouth every 4 (four) hours as needed for severe pain.   Pen Needles 32G X 4 MM Misc 1 Device by Does not apply route 2 (two) times daily.   pravastatin 40 MG tablet Commonly known as:  PRAVACHOL Take 1 tablet (40 mg total) by mouth daily.   prednisoLONE acetate 1 % ophthalmic suspension Commonly known as:  PRED FORTE Place 1 drop into the right eye 4 (four) times daily. Surgery scheduled for 02/05, meds to be started 02/05 after surgery   Probiotic Caps Take 1 capsule by mouth daily.   pyridoxine 100 MG tablet Commonly known as:  B-6 Take 100 mg by mouth daily. Reported on 04/15/2015   trimethoprim-polymyxin b ophthalmic solution Commonly known as:  POLYTRIM Place 1 drop into both eyes 4 (four) times daily. Surgery scheduled for 02/05, meds to be started 02/03   vitamin C 500 MG tablet Commonly known as:  ASCORBIC ACID Take 500 mg by mouth daily.       Allergies:  Allergies  Allergen Reactions  . Cleocin [Clindamycin Hcl] Diarrhea    Diarrhea   . Lisinopril Other (See Comments)    Elevated potassium per pt report  . Amoxicillin Diarrhea and Nausea Only    Has patient had a PCN reaction causing immediate rash, facial/tongue/throat swelling, SOB or lightheadedness with hypotension: No Has patient had a PCN reaction causing severe rash  involving mucus membranes or skin necrosis: No Has patient had a PCN reaction that required hospitalization: No Has patient had a PCN reaction occurring within the last 10 years: No If all of the above answers are "NO", then may proceed with Cephalosporin use.   . Bactrim [Sulfamethoxazole-Trimethoprim] Diarrhea and Nausea Only    Past Medical History:  Diagnosis Date  . Anemia   . C. difficile diarrhea 09/26/2014  . Cataracts, both eyes   . Cellulitis of right foot 06/02/2014  . CKD (chronic kidney disease) stage 3, GFR 30-59 ml/min (HCC)    sees Dr Justin Mend- Stage IV  . Diabetic ulcer of right foot ([place]) 06/02/2014  . DVT (deep venous thrombosis) ([place]) 10/2014   one in each one in leg- PICC line   . H/O seasonal allergies   . Heart murmur    told once when she was pregnant- no furter mention- was in notes 11/2015- had Echo 8 /4/17  . History of blood transfusion   . Hypercholesteremia   . Hypertension   . Left foot infection   . Neuropathy    feet  . Neuropathy in diabetes ([place])   . Peripheral vascular disease ([place])   . Staphylococcus aureus bacteremia 10/2014  . Type 1 diabetes ([place])    onset age 61    Past Surgical History:  Procedure Laterality Date  . AMPUTATION TOE Left 07/24/2015   5th toe and Hallux amputation  . CESAREAN SECTION    . EXOSTECTECTOMY TOE Left 04/11/2016   Procedure: EXOSTECTECTOMY CUBOID AND 5TH METATARSAL AND PLACEMENT OF ANTIBIOTIC BEADS;  Surgeon: Edrick Kins, DPM;  Location: [facility];  Service: Podiatry;  Laterality: Left;  . EYE SURGERY Bilateral    [place]  . FEMORAL-POPLITEAL BYPASS GRAFT Left 05/03/2015  . FOOT AMPUTATION THROUGH METATARSAL Right 2016  . hemrrhoidectomy    . I&D EXTREMITY Right 06/10/2014   Procedure: IRRIGATION AND DEBRIDEMENT Right Foot;  Surgeon: Newt Minion, MD;  [facility]  OR;  Service: Orthopedics;  Laterality: Right;  . INCISION AND DRAINAGE Left 04/11/2016   Procedure: INCISION AND DRAINAGE A DEEP COMPLICATED WOUND OF LEFT  FOOT;  Surgeon: Edrick Kins, DPM;  Location: [facility];  Service: Podiatry;  Laterality: Left;  . IRRIGATION AND DEBRIDEMENT ABSCESS Left 05/06/2017   Procedure: IRRIGATION AND DEBRIDEMENT ABSCESS LEFT FOOT;  Surgeon: Edrick Kins, DPM;  Location: [facility];  Service: Podiatry;  Laterality: Left;  . PERIPHERALLY INSERTED CENTRAL CATHETER INSERTION    . SKIN GRAFT Right 06/15/2014  . SKIN GRAFT Left 06/2015   foot   . SKIN SPLIT GRAFT Right 06/15/2014   Procedure: SPLIT THICKNESS SKIN GRAFT RIGHT FOOT;  Surgeon: Newt Minion, MD;  Location: [facility];  Service: Orthopedics;  Laterality: Right;  . TRANSMETATARSAL AMPUTATION Right   . TRANSMETATARSAL AMPUTATION Right 09/11/2014    Family History  Problem Relation Age of Onset  . Hyperlipidemia Mother   . Dementia Mother     Social History:  reports that she quit smoking about 9 years ago. She quit after 15.00 years of use. she has never used smokeless tobacco. She reports that she drinks alcohol. She reports that she does not use drugs.  Review of Systems:   Hypertension/CKD:    Hypertension  Treated with amlodipine 5 mg, now followed by nephrologist  BP Readings from Last 3 Encounters:  05/22/17 138/90  05/10/17 (!) 158/82  05/02/17 (!) 181/92     Calcium is low but corrected calcium appears to be nearly normal She is taking calcitriol now Also has vitamin D deficiency   Lab Results  Component Value Date   CALCIUM 7.9 (L) 05/14/2017   PHOS 3.5 08/18/2014     Lab Results  Component Value Date   CREATININE 2.23 (H) 05/14/2017   BUN 37 (H) 05/14/2017   NA 134 (L) 05/14/2017   K 4.4 05/14/2017   CL 102 05/14/2017   CO2 21 05/14/2017     Lipids: Has been  treated with pravastatin Triglycerides are high LDL controlled    Lab Results  Component Value Date   CHOL 164 01/11/2017   HDL 25.50 (L) 01/11/2017   LDLCALC 78 05/02/2016   LDLDIRECT 93.0 01/11/2017   TRIG 282.0 (H) 01/11/2017   CHOLHDL 6 01/11/2017        Chronic anemia, followed by nephrologist and PCP:  Lab Results  Component Value Date   WBC 8.1 05/10/2017   HGB 8.2 (L) 05/10/2017   HCT 25.3 (L) 05/10/2017   MCV 82.4 05/10/2017   PLT 603 (H) 05/10/2017    She has a Charcot foot And has had amputation of Left fifth toe which was infected She is going to the podiatrist with  continued nonhealing ulcers Currently getting hyperbaric treatment since last Thursday    Examination:   BP 138/90 (BP Location: Left Arm, Patient Position: Sitting, Cuff Size: Normal)   Pulse 91   Ht 5\' 7"  (1.702 m)   Wt 129 lb (58.5 kg)   SpO2 99%   BMI 20.20 kg/m   Body mass index is 20.2 kg/m.  Exam not indicated   ASSESSMENT/ PLAN:   Diabetes type 1 with poor control and multiple complications    Her diabetes is persistently poorly controlled mostly from noncompliance with all measures of self-care  A1c is over 10% again as of 1/19  See history of present illness for details of her blood sugar patterns, current management and problems identified  Even with starting back on  her insulin pump recently her blood sugars are averaging nearly 400 She only has some good readings on waking up but higher readings the rest of the day either related to inadequate bolusing, possibly inadequate carbohydrate ratio and also an adequate correction factor and relatively low basal rate She is however not having any excessive swings in her blood sugar and no recent hypoglycemia She does stop her pump when going into hyperbaric chamber but not clear what her blood sugars do with this Yesterday she did not resume her pump for several hours after coming out  She is very compliant with her blood sugar testing but is still not able to get her sensor from the supplier for unknown reasons  DIABETIC nephropathy with nephrotic syndrome: followed by nephrologist    PLAN:     She will call the supplier to get her Freestyle libre Sensor  She needs to check her  blood sugar before going for her hyperbaric treatment and if blood sugar is over 200 try to corrected before suspending her pump  Resume her pump when she finishes her treatment and not delay  Carbohydrate coverage 1:50  Basal rate that should be increased at midnight up to 0.6, from 8 AM-4 PM = 0.5 and after 4 PM = 0.65  Blood sugar target 140  She will need to cover all meals and snacks adequately with boluses with entering carbohydrates  Restart vitamin D for vitamin D deficiency  There are no Patient Instructions on file for this visit.   Elayne Snare 05/22/2017, 1:42 PM  Counseling time on management subjects discussed in assessment and plan sections is over 50% of today's 25 minute visit

## 2017-05-22 NOTE — Patient Instructions (Signed)
If 250 before hyperbaric do correction  Suspend as little as possible

## 2017-05-23 ENCOUNTER — Telehealth: Payer: Self-pay | Admitting: Endocrinology

## 2017-05-23 DIAGNOSIS — E10621 Type 1 diabetes mellitus with foot ulcer: Secondary | ICD-10-CM | POA: Diagnosis not present

## 2017-05-23 LAB — GLUCOSE, CAPILLARY
GLUCOSE-CAPILLARY: 112 mg/dL — AB (ref 65–99)
Glucose-Capillary: 202 mg/dL — ABNORMAL HIGH (ref 65–99)
Glucose-Capillary: 471 mg/dL — ABNORMAL HIGH (ref 65–99)

## 2017-05-23 NOTE — Telephone Encounter (Signed)
Please advise on below I know you seen her recently and may know the exacts for these questions.

## 2017-05-23 NOTE — Telephone Encounter (Signed)
Continuous Blood Gluc Receiver (FREESTYLE LIBRE READER) [place] has a few questions about patient.   Did patient purchase her libra through medicare or was it an out of pocket purchase?   Is the patient currently on a insulin pump?    it states she is taking her blood sugar up to four times a day, needs to say taken four times a day.  And how many times they are taking insulin injections daily.    [phone] [facility].  Pt account number for reference  [id number]

## 2017-05-24 DIAGNOSIS — E10621 Type 1 diabetes mellitus with foot ulcer: Secondary | ICD-10-CM | POA: Diagnosis not present

## 2017-05-24 LAB — GLUCOSE, CAPILLARY
GLUCOSE-CAPILLARY: 189 mg/dL — AB (ref 65–99)
Glucose-Capillary: 526 mg/dL (ref 65–99)

## 2017-05-24 NOTE — Telephone Encounter (Signed)
Attempted to reach patient via phone regarding next steps and her trip to see the specialist in [place] .

## 2017-05-24 NOTE — Telephone Encounter (Signed)
Insulin submitted to the [facility] on [address].

## 2017-05-27 DIAGNOSIS — E10621 Type 1 diabetes mellitus with foot ulcer: Secondary | ICD-10-CM | POA: Diagnosis not present

## 2017-05-27 LAB — GLUCOSE, CAPILLARY
GLUCOSE-CAPILLARY: 262 mg/dL — AB (ref 65–99)
GLUCOSE-CAPILLARY: 179 mg/dL — AB (ref 65–99)

## 2017-05-28 DIAGNOSIS — E10621 Type 1 diabetes mellitus with foot ulcer: Secondary | ICD-10-CM | POA: Diagnosis not present

## 2017-05-28 LAB — GLUCOSE, CAPILLARY
GLUCOSE-CAPILLARY: 284 mg/dL — AB (ref 65–99)
Glucose-Capillary: 271 mg/dL — ABNORMAL HIGH (ref 65–99)

## 2017-05-28 NOTE — Telephone Encounter (Signed)
Phone went straight to voice mail.  Message left to call me with questions concerning [name] and the [employer] sensor.

## 2017-05-29 ENCOUNTER — Ambulatory Visit (INDEPENDENT_AMBULATORY_CARE_PROVIDER_SITE_OTHER): Payer: Medicare Other | Admitting: Podiatry

## 2017-05-29 ENCOUNTER — Ambulatory Visit (INDEPENDENT_AMBULATORY_CARE_PROVIDER_SITE_OTHER): Payer: Medicare Other

## 2017-05-29 DIAGNOSIS — L97519 Non-pressure chronic ulcer of other part of right foot with unspecified severity: Secondary | ICD-10-CM

## 2017-05-29 DIAGNOSIS — I70235 Atherosclerosis of native arteries of right leg with ulceration of other part of foot: Secondary | ICD-10-CM

## 2017-05-29 DIAGNOSIS — L97529 Non-pressure chronic ulcer of other part of left foot with unspecified severity: Secondary | ICD-10-CM

## 2017-05-29 DIAGNOSIS — E0843 Diabetes mellitus due to underlying condition with diabetic autonomic (poly)neuropathy: Secondary | ICD-10-CM

## 2017-05-29 DIAGNOSIS — E08621 Diabetes mellitus due to underlying condition with foot ulcer: Secondary | ICD-10-CM

## 2017-05-29 DIAGNOSIS — I70245 Atherosclerosis of native arteries of left leg with ulceration of other part of foot: Secondary | ICD-10-CM | POA: Diagnosis not present

## 2017-05-29 DIAGNOSIS — L03116 Cellulitis of left lower limb: Secondary | ICD-10-CM | POA: Diagnosis not present

## 2017-05-29 DIAGNOSIS — E10621 Type 1 diabetes mellitus with foot ulcer: Secondary | ICD-10-CM | POA: Diagnosis not present

## 2017-05-29 LAB — GLUCOSE, CAPILLARY
GLUCOSE-CAPILLARY: 401 mg/dL — AB (ref 65–99)
GLUCOSE-CAPILLARY: 230 mg/dL — AB (ref 65–99)

## 2017-05-29 NOTE — Telephone Encounter (Signed)
No call  Back from [facility].  Message left on her answering machine that Mistina is currently on an iinsulin pump, and testing 4 times/day, and purchased this through medicare.  She was given my number to call if more questions.Marland Kitchen

## 2017-05-29 NOTE — Patient Instructions (Signed)
Pre-Operative Instructions  Congratulations, you have decided to take an important step towards improving your quality of life.  You can be assured that the doctors and staff at [facility] will be with you every step of the way.  Here are some important things you should know:  1. Plan to be at the surgery center/hospital at least 1 (one) hour prior to your scheduled time, unless otherwise directed by the surgical center/hospital staff.  You must have a responsible adult accompany you, remain during the surgery and drive you home.  Make sure you have directions to the surgical center/hospital to ensure you arrive on time. 2. If you are having surgery at [facility] or [facility], you will need a copy of your medical history and physical form from your family physician within one month prior to the date of surgery. We will give you a form for your primary physician to complete.  3. We make every effort to accommodate the date you request for surgery.  However, there are times where surgery dates or times have to be moved.  We will contact you as soon as possible if a change in schedule is required.   4. No aspirin/ibuprofen for one week before surgery.  If you are on aspirin, any non-steroidal anti-inflammatory medications (Mobic, Aleve, Ibuprofen) should not be taken seven (7) days prior to your surgery.  You make take Tylenol for pain prior to surgery.  5. Medications - If you are taking daily heart and blood pressure medications, seizure, reflux, allergy, asthma, anxiety, pain or diabetes medications, make sure you notify the surgery center/hospital before the day of surgery so they can tell you which medications you should take or avoid the day of surgery. 6. No food or drink after midnight the night before surgery unless directed otherwise by surgical center/hospital staff. 7. No alcoholic beverages 24-hours prior to surgery.  No smoking 24-hours prior or 24-hours after  surgery. 8. Wear loose pants or shorts. They should be loose enough to fit over bandages, boots, and casts. 9. Don't wear slip-on shoes. Sneakers are preferred. 10. Bring your boot with you to the surgery center/hospital.  Also bring crutches or a walker if your physician has prescribed it for you.  If you do not have this equipment, it will be provided for you after surgery. 11. If you have not been contacted by the surgery center/hospital by the day before your surgery, call to confirm the date and time of your surgery. 12. Leave-time from work may vary depending on the type of surgery you have.  Appropriate arrangements should be made prior to surgery with your employer. 13. Prescriptions will be provided immediately following surgery by your doctor.  Fill these as soon as possible after surgery and take the medication as directed. Pain medications will not be refilled on weekends and must be approved by the doctor. 14. Remove nail polish on the operative foot and avoid getting pedicures prior to surgery. 15. Wash the night before surgery.  The night before surgery wash the foot and leg well with water and the antibacterial soap provided. Be sure to pay special attention to beneath the toenails and in between the toes.  Wash for at least three (3) minutes. Rinse thoroughly with water and dry well with a towel.  Perform this wash unless told not to do so by your physician.  Enclosed: 1 Ice pack (please put in freezer the night before surgery)   1 Hibiclens skin cleaner     Pre-op instructions  If you have any questions regarding the instructions, please do not hesitate to call our office.  [place]: [address], [place], [place] [address] -- [phone]  [place]: [address] [place], [place] [address] -- [phone]  [place]: [address]  [place], [place] [address] -- [phone]  [place]: [name], [address], [place], [place] [address] -- [phone]  Website:  [email or web address] 

## 2017-05-30 ENCOUNTER — Telehealth: Payer: Self-pay | Admitting: Dietician

## 2017-05-30 DIAGNOSIS — E10621 Type 1 diabetes mellitus with foot ulcer: Secondary | ICD-10-CM | POA: Diagnosis not present

## 2017-05-30 LAB — GLUCOSE, CAPILLARY
GLUCOSE-CAPILLARY: 314 mg/dL — AB (ref 65–99)
GLUCOSE-CAPILLARY: 216 mg/dL — AB (ref 65–99)

## 2017-05-30 NOTE — Telephone Encounter (Signed)
Brief Nutrition Note Called patient and left a message regarding coverage information for the FreeStyle Libre 14 day sensors.  Mary from [facility] called to state that the 14 day sensors are covered but if she bought the reader through Medicare then she needs to ask the site where she purchased this for an upgraded reader for the 14 sensors.  If she did not purchase this through Medicare then one can be requested.    Patient is to call Stanton Kidney at [facility] with any questions regarding this [phone].  OR the [facility] Endocrinology office.  Antonieta Iba, RD, LDN, CDE

## 2017-05-31 ENCOUNTER — Encounter (HOSPITAL_BASED_OUTPATIENT_CLINIC_OR_DEPARTMENT_OTHER): Payer: Medicare Other | Attending: Internal Medicine

## 2017-05-31 DIAGNOSIS — E10621 Type 1 diabetes mellitus with foot ulcer: Secondary | ICD-10-CM | POA: Diagnosis present

## 2017-05-31 DIAGNOSIS — E1051 Type 1 diabetes mellitus with diabetic peripheral angiopathy without gangrene: Secondary | ICD-10-CM | POA: Diagnosis not present

## 2017-05-31 DIAGNOSIS — L97524 Non-pressure chronic ulcer of other part of left foot with necrosis of bone: Secondary | ICD-10-CM | POA: Diagnosis not present

## 2017-05-31 DIAGNOSIS — Z86718 Personal history of other venous thrombosis and embolism: Secondary | ICD-10-CM | POA: Diagnosis not present

## 2017-05-31 DIAGNOSIS — G4730 Sleep apnea, unspecified: Secondary | ICD-10-CM | POA: Diagnosis not present

## 2017-05-31 DIAGNOSIS — E1052 Type 1 diabetes mellitus with diabetic peripheral angiopathy with gangrene: Secondary | ICD-10-CM | POA: Insufficient documentation

## 2017-05-31 DIAGNOSIS — I10 Essential (primary) hypertension: Secondary | ICD-10-CM | POA: Diagnosis not present

## 2017-05-31 DIAGNOSIS — L97511 Non-pressure chronic ulcer of other part of right foot limited to breakdown of skin: Secondary | ICD-10-CM | POA: Diagnosis not present

## 2017-05-31 DIAGNOSIS — M86072 Acute hematogenous osteomyelitis, left ankle and foot: Secondary | ICD-10-CM | POA: Diagnosis not present

## 2017-05-31 DIAGNOSIS — A480 Gas gangrene: Secondary | ICD-10-CM | POA: Diagnosis not present

## 2017-05-31 DIAGNOSIS — E1040 Type 1 diabetes mellitus with diabetic neuropathy, unspecified: Secondary | ICD-10-CM | POA: Diagnosis not present

## 2017-05-31 DIAGNOSIS — E1069 Type 1 diabetes mellitus with other specified complication: Secondary | ICD-10-CM | POA: Insufficient documentation

## 2017-05-31 LAB — GLUCOSE, CAPILLARY
GLUCOSE-CAPILLARY: 269 mg/dL — AB (ref 65–99)
Glucose-Capillary: 171 mg/dL — ABNORMAL HIGH (ref 65–99)

## 2017-06-03 ENCOUNTER — Ambulatory Visit (INDEPENDENT_AMBULATORY_CARE_PROVIDER_SITE_OTHER): Payer: Medicare Other | Admitting: Internal Medicine

## 2017-06-03 ENCOUNTER — Encounter: Payer: Self-pay | Admitting: Internal Medicine

## 2017-06-03 DIAGNOSIS — L03116 Cellulitis of left lower limb: Secondary | ICD-10-CM | POA: Diagnosis present

## 2017-06-03 DIAGNOSIS — E10621 Type 1 diabetes mellitus with foot ulcer: Secondary | ICD-10-CM | POA: Diagnosis not present

## 2017-06-03 LAB — GLUCOSE, CAPILLARY
GLUCOSE-CAPILLARY: 286 mg/dL — AB (ref 65–99)
GLUCOSE-CAPILLARY: 204 mg/dL — AB (ref 65–99)

## 2017-06-03 NOTE — Assessment & Plan Note (Signed)
I am not sure at what level the transmetatarsal amputation is going to be performed but it appears that it would be very difficult to remove all necrotic, infected tissue and leave her with a stable foot.  She will continue current antibiotics through her planned surgery.

## 2017-06-03 NOTE — Progress Notes (Signed)
[facility]  Patient Active Problem List   Diagnosis Date Noted  . Cellulitis of left foot 10/13/2014    Priority: High  . Status post transmetatarsal amputation of right foot ([place]) 09/26/2014    Priority: High  . Foot ulcer due to secondary DM ([place]) 05/09/2011    Priority: High  . Gangrene of left foot ([place]) 05/08/2017  . Osteomyelitis of left foot ([place]) 05/08/2017  . Diabetic foot infection ([place])   . Diabetic foot ulcer ([place]) 05/05/2017  . Cellulitis 05/04/2017  . Diabetes mellitus type 1 with peripheral artery disease ([place]) 01/16/2017  . Heart murmur 11/03/2015  . Protein-calorie malnutrition, moderate ([place]) 10/18/2014  . History of DVT (deep vein thrombosis)   . Chronic anemia 10/13/2014  . DM type 1 causing renal disease ([place]) 06/13/2013  . CKD (chronic kidney disease), stage III ([place]) 06/12/2013  . Charcot foot due to diabetes mellitus ([place]) 05/09/2011  . Type 1 diabetes mellitus with neurological manifestations, uncontrolled ([place])   . Hypertension   . Hypercholesteremia     Patient's Medications  New Prescriptions   No medications on file  Previous Medications   ALBUTEROL (PROVENTIL HFA;VENTOLIN HFA) 108 (90 BASE) MCG/ACT INHALER    Inhale 2 puffs into the lungs every 6 (six) hours as needed for wheezing or shortness of breath. Reported on 04/15/2015   AMLODIPINE (NORVASC) 5 MG TABLET    Take 1 tablet (5 mg total) by mouth daily.   ASPIRIN 325 MG TABLET    Take 325 mg by mouth daily.   BRIMONIDINE (ALPHAGAN) 0.2 % OPHTHALMIC SOLUTION    Place 1 drop into the right eye 2 (two) times daily. Surgery scheduled for 02/05, meds to be started 02/03   CALCITRIOL (ROCALTROL) 0.5 MCG CAPSULE    Take 0.5 mcg by mouth daily.    CALCIUM-PHOSPHORUS-VITAMIN D (CALCIUM GUMMIES) [phone] MG-MG-UNIT CHEW    Chew 2 each by mouth daily.   CINNAMON PO    Take 1 tablet by mouth daily.   CONTINUOUS BLOOD GLUC RECEIVER (FREESTYLE LIBRE READER) DEVI     1 Device by Does not apply route as directed.   CONTINUOUS BLOOD GLUC SENSOR (FREESTYLE LIBRE SENSOR SYSTEM) MISC    Use 1 sensor every 10 days   CONTOUR NEXT TEST TEST STRIP    USE AS INSTRUCTED TO CHECK BLOOD SUGAR 4 TIMES A DAY. DX E10.65   CYCLOPENTOLATE (CYCLODRYL,CYCLOGYL) 1 % OPHTHALMIC SOLUTION    Place 1 drop into the right eye 3 (three) times daily. Surgery scheduled for 02/05, meds to be started 02/04   ERGOCALCIFEROL (VITAMIN D2) 50000 UNITS CAPSULE    Take 1 capsule (50,000 Units total) by mouth once a week.   FOLIC ACID (FOLVITE) 1 MG TABLET    Take 1 mg by mouth daily.   FUROSEMIDE (LASIX) 40 MG TABLET    Take 40 mg by mouth daily as needed for fluid.    GABAPENTIN (NEURONTIN) 300 MG CAPSULE    Take 1 capsule (300 mg total) by mouth 2 (two) times daily.   HUMALOG 100 UNIT/ML INJECTION    CALL MD IF <70, IF 151-200 =2UNITS,201-250 =4 UNITS,251-300 =6 UNITS, 301-350 =8 UNITS,351-400 =10 U   INSULIN GLARGINE (LANTUS SOLOSTAR) 100 UNIT/ML SOLOSTAR PEN    Inject 12 Units into the skin daily at 10 pm.   INSULIN PEN NEEDLE (PEN NEEDLES) 32G X 4 MM MISC    1 Device by Does not apply  route 2 (two) times daily.   KETOROLAC (ACULAR) 0.5 % OPHTHALMIC SOLUTION    Place 1 drop into the right eye 4 (four) times daily. Start 1 week prior to surgery scheduled for 02/05   LEVOFLOXACIN (LEVAQUIN) 250 MG TABLET    Take 1 tablet (250 mg total) by mouth 2 (two) times daily.   LORATADINE (CLARITIN) 10 MG TABLET    Take 1 tablet (10 mg total) by mouth daily.   METRONIDAZOLE (FLAGYL) 500 MG TABLET    Take 1 tablet (500 mg total) by mouth 3 (three) times daily.   ONDANSETRON (ZOFRAN-ODT) 4 MG DISINTEGRATING TABLET    DISSOLVE 1 TABLET ON TONGUE EVERY 8 HOURS AS NEEDED FOR NAUSEA   OXYCODONE-ACETAMINOPHEN (PERCOCET/ROXICET) 5-325 MG TABLET    Take 1-2 tablets by mouth every 4 (four) hours as needed for severe pain.   PRAVASTATIN (PRAVACHOL) 40 MG TABLET    Take 1 tablet (40 mg total) by mouth daily.    PREDNISOLONE ACETATE (PRED FORTE) 1 % OPHTHALMIC SUSPENSION    Place 1 drop into the right eye 4 (four) times daily. Surgery scheduled for 02/05, meds to be started 02/05 after surgery   PROBIOTIC CAPS    Take 1 capsule by mouth daily.   PYRIDOXINE (B-6) 100 MG TABLET    Take 100 mg by mouth daily. Reported on 04/15/2015   TRIMETHOPRIM-POLYMYXIN B (POLYTRIM) OPHTHALMIC SOLUTION    Place 1 drop into both eyes 4 (four) times daily. Surgery scheduled for 02/05, meds to be started 02/03   VITAMIN C (ASCORBIC ACID) 500 MG TABLET    Take 500 mg by mouth daily.  Modified Medications   No medications on file  Discontinued Medications   No medications on file    Subjective: Cindee is in for her hospital follow-up visit.  She was hospitalized recently with acute onset of severe left foot infection.  While hospitalized she refused BKA and wanted to go to [place] where an orthopedic surgeon was going to perform an ERTL amputation that she had heard about.  Since leaving the hospital she has decided to undergo transmetatarsal amputation here by Dr. Merril Abbe.  She is currently also being seen at the wound center and undergoing hyperbaric oxygen therapy.  She believes her surgery is scheduled for 06/10/2017.  She has had no problems tolerating her levofloxacin or metronidazole.  Review of Systems: Review of Systems  Constitutional: Negative for chills, diaphoresis and fever.  Gastrointestinal: Negative for abdominal pain, diarrhea, nausea and vomiting.  Musculoskeletal: Negative for joint pain.    Past Medical History:  Diagnosis Date  . Anemia   . C. difficile diarrhea 09/26/2014  . Cataracts, both eyes   . Cellulitis of right foot 06/02/2014  . CKD (chronic kidney disease) stage 3, GFR 30-59 ml/min (HCC)    sees Dr Justin Mend- Stage IV  . Diabetic ulcer of right foot ([place]) 06/02/2014  . DVT (deep venous thrombosis) ([place]) 10/2014   one in each one in leg- PICC line   . H/O seasonal allergies   . Heart  murmur    told once when she was pregnant- no furter mention- was in notes 11/2015- had Echo 8 /4/17  . History of blood transfusion   . Hypercholesteremia   . Hypertension   . Left foot infection   . Neuropathy    feet  . Neuropathy in diabetes ([place])   . Peripheral vascular disease ([place])   . Staphylococcus aureus bacteremia 10/2014  . Type 1 diabetes ([place])  onset age 37    Social History   Tobacco Use  . Smoking status: Former Smoker    Years: 15.00    Last attempt to quit: 05/16/2008    Years since quitting: 9.0  . Smokeless tobacco: Never Used  Substance Use Topics  . Alcohol use: Yes    Comment: Occasional  . Drug use: No    Family History  Problem Relation Age of Onset  . Hyperlipidemia Mother   . Dementia Mother     Allergies  Allergen Reactions  . Cleocin [Clindamycin Hcl] Diarrhea    Diarrhea   . Lisinopril Other (See Comments)    Elevated potassium per pt report  . Amoxicillin Diarrhea and Nausea Only    Has patient had a PCN reaction causing immediate rash, facial/tongue/throat swelling, SOB or lightheadedness with hypotension: No Has patient had a PCN reaction causing severe rash involving mucus membranes or skin necrosis: No Has patient had a PCN reaction that required hospitalization: No Has patient had a PCN reaction occurring within the last 10 years: No If all of the above answers are "NO", then may proceed with Cephalosporin use.   . Bactrim [Sulfamethoxazole-Trimethoprim] Diarrhea and Nausea Only    Objective: Vitals:   06/03/17 1405  BP: (!) 152/83  Pulse: (!) 103  Temp: 98.1 F (36.7 C)  TempSrc: Oral  Weight: 127 lb (57.6 kg)  Height: 5\' 7"  (1.702 m)   Body mass index is 19.89 kg/m.  Physical Exam  Constitutional: She is oriented to person, place, and time. No distress.  Musculoskeletal:  Her left fifth toe is surgically absent.  She has a large necrotic wound laterally and on the plantar surface of her foot.  The open wound  extends well back into the midfoot.  Neurological: She is alert and oriented to person, place, and time.  Psychiatric: Mood and affect normal.    Lab Results    Problem List Items Addressed This Visit      High   Cellulitis of left foot    I am not sure at what level the transmetatarsal amputation is going to be performed but it appears that it would be very difficult to remove all necrotic, infected tissue and leave her with a stable foot.  She will continue current antibiotics through her planned surgery.          Michel Bickers, MD [facility] [phone] pager   [phone] cell 06/03/2017, 2:42 PM

## 2017-06-04 DIAGNOSIS — E10621 Type 1 diabetes mellitus with foot ulcer: Secondary | ICD-10-CM | POA: Diagnosis not present

## 2017-06-04 LAB — GLUCOSE, CAPILLARY
Glucose-Capillary: 429 mg/dL — ABNORMAL HIGH (ref 65–99)
Glucose-Capillary: 419 mg/dL — ABNORMAL HIGH (ref 65–99)

## 2017-06-05 ENCOUNTER — Telehealth: Payer: Self-pay | Admitting: *Deleted

## 2017-06-05 DIAGNOSIS — E10621 Type 1 diabetes mellitus with foot ulcer: Secondary | ICD-10-CM | POA: Diagnosis not present

## 2017-06-05 LAB — GLUCOSE, CAPILLARY
GLUCOSE-CAPILLARY: 192 mg/dL — AB (ref 65–99)
Glucose-Capillary: 209 mg/dL — ABNORMAL HIGH (ref 65–99)

## 2017-06-05 NOTE — Telephone Encounter (Signed)
"  I'm calling to see when my surgery is scheduled."  We have you scheduled for surgery on March 28 at [facility], [address].  "So what is this pre-operative appointment?"  You have an appointment on March 11 with [name] at [facility] [place].  "Okay, I just wanted to make sure because I didn't want to give my daddy the wrong date.  He's going to fly in to help me."

## 2017-06-06 DIAGNOSIS — E10621 Type 1 diabetes mellitus with foot ulcer: Secondary | ICD-10-CM | POA: Diagnosis not present

## 2017-06-06 LAB — GLUCOSE, CAPILLARY
GLUCOSE-CAPILLARY: 245 mg/dL — AB (ref 65–99)
GLUCOSE-CAPILLARY: 331 mg/dL — AB (ref 65–99)

## 2017-06-06 NOTE — Progress Notes (Signed)
HPI: 47 year old female with a history of uncontrolled diabetes mellitus and previous amputations Presents the office today for follow-up evaluation regarding left foot cellulitis. Patient was recently discharged from [facility].  While inpatient below-knee amputation was recommended to the left lower extremity.  The patient changed her mind last minute and wants to do everything that she can to salvage her foot instead of undergo the below-knee amputation.  Incision and drainage with excisional debridement was performed and patient and she presents today for follow-up treatment evaluation  Past Medical History:  Diagnosis Date  . Anemia   . C. difficile diarrhea 09/26/2014  . Cataracts, both eyes   . Cellulitis of right foot 06/02/2014  . CKD (chronic kidney disease) stage 3, GFR 30-59 ml/min (HCC)    sees Dr Justin Mend- Stage IV  . Diabetic ulcer of right foot ([place]) 06/02/2014  . DVT (deep venous thrombosis) ([place]) 10/2014   one in each one in leg- PICC line   . H/O seasonal allergies   . Heart murmur    told once when she was pregnant- no furter mention- was in notes 11/2015- had Echo 8 /4/17  . History of blood transfusion   . Hypercholesteremia   . Hypertension   . Left foot infection   . Neuropathy    feet  . Neuropathy in diabetes ([place])   . Peripheral vascular disease ([place])   . Staphylococcus aureus bacteremia 10/2014  . Type 1 diabetes ([place])    onset age 28        Physical Exam: General: The patient is alert and oriented x3 in no acute distress.  Dermatology: Ulcer noted to the right foot measuring 0.5 x 0.5 x 0.2 cm.  To the noted ulceration there is no eschar.  There is a minimal amount of slough fibrin necrotic tissue noted.  Wound base is red.  Granulation tissue is red.  There is no exposed bone muscle tendon ligament or joint.  Periwound integrity is intact.  Wound #2 noted to the left footMeasures approximately 9.0 x 5.0 x 0.5 cm.  To the noted ulceration there is  an extensive amount of slough fibrin necrotic tissue noted.  Wound base is intermittent granular tissue with necrotic tissue.  There is exposed bone noted to the ulceration site.  Periwound integrity is intact.  There is no malodor.  Vascular: Palpable pedal pulses bilaterally. No edema or erythema noted. Capillary refill within normal limits.  Neurological: Epicritic and protective threshold absent bilaterally.   Musculoskeletal Exam: History of prior multiple amputations.  Transmetatarsal amputation right foot.  Multiple toe amputations left foot.  Assessment: -History of transmetatarsal amputation right foot. -Ulcer left foot secondary to diabetes mellitus -Ulcer right foot secondary to diabetes mellitus -Diabetes mellitus, uncontrolled with peripheral neuropathy   Plan of Care:  - Patient evaluated.  -Today I explained to the patient that if she wants to salvage her foot the best option for her left lower extremity would be a transmetatarsal amputation.  Primary closure would likely not be achieved and the patient would require wound VAC therapy and wound care.  Dr. Dellia Nims at the wound care center believes that the foot is possibly salvageable.  Patient understands she continues to be at high risk for below-knee amputation and this is simply a salvage request for the patient.  Patient understands all possible complications and details regarding the surgery.  All patient questions were answered.  No guarantees were expressed or implied. -Authorization for surgery was initiated today.  Surgery will consist of transmetatarsal amputation left foot.  Excisional debridement of ulcer left foot.  Tendo Achilles lengthening left foot.  Possible application of wound VAC left foot. -The patient has an appointment with Dr. Megan Salon, infectious disease on 06/03/2017.  Currently she is taking Levaquin daily -Patient is also currently seeing Dr. Dellia Nims at the wound care center 2 times per week for wound care.   Postoperatively she will continue wound care at the wound care center -Medically necessary excisional debridement including subcutaneous tissue was performed to the right foot ulceration.  Excisional debridement of all necrotic nonviable tissue down to healthy bleeding viable tissue was performed with post debridement measurement same as pre-. -Return to clinic 1 week postop    Edrick Kins, DPM [facility]  Dr. Edrick Kins, DPM    [address], [place] [address]                Office [phone]  Fax [phone]

## 2017-06-07 DIAGNOSIS — E10621 Type 1 diabetes mellitus with foot ulcer: Secondary | ICD-10-CM | POA: Diagnosis not present

## 2017-06-07 LAB — GLUCOSE, CAPILLARY
GLUCOSE-CAPILLARY: 433 mg/dL — AB (ref 65–99)
Glucose-Capillary: 389 mg/dL — ABNORMAL HIGH (ref 65–99)

## 2017-06-10 ENCOUNTER — Encounter: Payer: Self-pay | Admitting: Podiatry

## 2017-06-10 ENCOUNTER — Ambulatory Visit (INDEPENDENT_AMBULATORY_CARE_PROVIDER_SITE_OTHER): Payer: Medicare Other | Admitting: Podiatry

## 2017-06-10 DIAGNOSIS — E08621 Diabetes mellitus due to underlying condition with foot ulcer: Secondary | ICD-10-CM | POA: Diagnosis not present

## 2017-06-10 DIAGNOSIS — L97519 Non-pressure chronic ulcer of other part of right foot with unspecified severity: Secondary | ICD-10-CM

## 2017-06-10 DIAGNOSIS — E10621 Type 1 diabetes mellitus with foot ulcer: Secondary | ICD-10-CM | POA: Diagnosis not present

## 2017-06-10 DIAGNOSIS — L97529 Non-pressure chronic ulcer of other part of left foot with unspecified severity: Secondary | ICD-10-CM

## 2017-06-10 DIAGNOSIS — I70245 Atherosclerosis of native arteries of left leg with ulceration of other part of foot: Secondary | ICD-10-CM | POA: Diagnosis not present

## 2017-06-10 DIAGNOSIS — E0843 Diabetes mellitus due to underlying condition with diabetic autonomic (poly)neuropathy: Secondary | ICD-10-CM

## 2017-06-10 LAB — GLUCOSE, CAPILLARY
Glucose-Capillary: 184 mg/dL — ABNORMAL HIGH (ref 65–99)
Glucose-Capillary: 247 mg/dL — ABNORMAL HIGH (ref 65–99)

## 2017-06-10 NOTE — Progress Notes (Signed)
HPI: 47 year old female with a history of uncontrolled diabetes mellitus and previous amputations Presents the office today for follow-up evaluation regarding an ulceration to the left foot.  Patient was last seen on 05/29/2017 at which time surgery was scheduled for a transmetatarsal amputation with possible application of a wound VAC.  The patient has been going to hyperbaric oxygen treatment daily at the [facility] wound care center under the direction of Dr. Dellia Nims.  Patient states that since the visit on 05/29/2017 there is been significant improvement of the ulceration.  She presents today for consultation regarding her surgery.  The patient would not like to undergo transmetatarsal amputation and instead would like to pursue salvage of the foot.  She presents today for further treatment and evaluation  Past Medical History:  Diagnosis Date  . Anemia   . C. difficile diarrhea 09/26/2014  . Cataracts, both eyes   . Cellulitis of right foot 06/02/2014  . CKD (chronic kidney disease) stage 3, GFR 30-59 ml/min (HCC)    sees Dr Justin Mend- Stage IV  . Diabetic ulcer of right foot ([place]) 06/02/2014  . DVT (deep venous thrombosis) ([place]) 10/2014   one in each one in leg- PICC line   . H/O seasonal allergies   . Heart murmur    told once when she was pregnant- no furter mention- was in notes 11/2015- had Echo 8 /4/17  . History of blood transfusion   . Hypercholesteremia   . Hypertension   . Left foot infection   . Neuropathy    feet  . Neuropathy in diabetes ([place])   . Peripheral vascular disease ([place])   . Staphylococcus aureus bacteremia 10/2014  . Type 1 diabetes ([place])    onset age 46     Physical Exam: General: The patient is alert and oriented x3 in no acute distress.  Dermatology: Ulcer noted to the right foot measuring 0.2 x 0.2 x 0.2 cm.  To the noted ulceration there is no eschar.  There is a minimal amount of slough fibrin necrotic tissue noted.  Wound base is red.  Granulation tissue is  red.  There is no exposed bone muscle tendon ligament or joint.  Periwound integrity is intact.  Wound #2 noted to the left footMeasures approximately 9.0 x 5.0 x 0.5 cm.  To the noted ulceration there is an extensive amount of slough fibrin necrotic tissue noted.  Wound base is intermittent granular tissue with necrotic tissue.  There is exposed bone noted to the ulceration site.  Periwound integrity is intact.  There is no malodor.  Vascular: Palpable pedal pulses bilaterally. No edema or erythema noted. Capillary refill within normal limits.  Neurological: Epicritic and protective threshold absent bilaterally.   Musculoskeletal Exam: History of prior multiple amputations.  Transmetatarsal amputation right foot.  Multiple toe amputations left foot.  Assessment: -History of transmetatarsal amputation right foot. -Ulcer left foot secondary to diabetes mellitus -Ulcer right foot secondary to diabetes mellitus -Diabetes mellitus, uncontrolled with peripheral neuropathy   Plan of Care:  - Patient evaluated.  -Today we discussed in detail the possible treatment options and surgical interventions regarding the patient's left foot condition.  Currently I feel that salvage of the foot through surgical debridement and application of skin graft vs. transmetatarsal amputation with tendo Achilles lengthening vs. below-knee amputation are all viable options given the patient's condition.  The patient would really like to prevent further amputation and pursue salvage of the left foot.  The patient understands that there are  no guarantees regarding limb salvage.  Today we will modify the authorization for surgery and currently not undergo foot amputation. -Authorization for surgery was reinitiated and adjusted today.  Surgery will consist of excisional debridement of ulcer with application of skin graft.  This graft will be medically necessary.  We will attempt to use [place] Skin graft.  Surgery is  currently scheduled for June 27, 2017 at [facility] hospital -Continue management at the [facility] wound care center under the care of Dr. Dellia Nims - Continue hyperbaric oxygen treatment -Continue infectious disease management with Dr. Megan Salon -Return to clinic 1 week postop   Edrick Kins, DPM [facility]  Dr. Edrick Kins, DPM    [address], [place] [address]                Office [phone]  Fax [phone]

## 2017-06-11 DIAGNOSIS — E10621 Type 1 diabetes mellitus with foot ulcer: Secondary | ICD-10-CM | POA: Diagnosis not present

## 2017-06-12 DIAGNOSIS — E10621 Type 1 diabetes mellitus with foot ulcer: Secondary | ICD-10-CM | POA: Diagnosis not present

## 2017-06-12 LAB — GLUCOSE, CAPILLARY
GLUCOSE-CAPILLARY: 186 mg/dL — AB (ref 65–99)
Glucose-Capillary: 217 mg/dL — ABNORMAL HIGH (ref 65–99)
Glucose-Capillary: 280 mg/dL — ABNORMAL HIGH (ref 65–99)
Glucose-Capillary: 153 mg/dL — ABNORMAL HIGH (ref 65–99)

## 2017-06-13 DIAGNOSIS — E10621 Type 1 diabetes mellitus with foot ulcer: Secondary | ICD-10-CM | POA: Diagnosis not present

## 2017-06-13 LAB — GLUCOSE, CAPILLARY
GLUCOSE-CAPILLARY: 134 mg/dL — AB (ref 65–99)
Glucose-Capillary: 96 mg/dL (ref 65–99)

## 2017-06-14 DIAGNOSIS — E10621 Type 1 diabetes mellitus with foot ulcer: Secondary | ICD-10-CM | POA: Diagnosis not present

## 2017-06-14 LAB — GLUCOSE, CAPILLARY
GLUCOSE-CAPILLARY: 257 mg/dL — AB (ref 65–99)
GLUCOSE-CAPILLARY: 238 mg/dL — AB (ref 65–99)

## 2017-06-17 DIAGNOSIS — E10621 Type 1 diabetes mellitus with foot ulcer: Secondary | ICD-10-CM | POA: Diagnosis not present

## 2017-06-17 LAB — GLUCOSE, CAPILLARY: Glucose-Capillary: 210 mg/dL — ABNORMAL HIGH (ref 65–99)

## 2017-06-18 DIAGNOSIS — E10621 Type 1 diabetes mellitus with foot ulcer: Secondary | ICD-10-CM | POA: Diagnosis not present

## 2017-06-18 LAB — GLUCOSE, CAPILLARY
Glucose-Capillary: 262 mg/dL — ABNORMAL HIGH (ref 65–99)
Glucose-Capillary: 313 mg/dL — ABNORMAL HIGH (ref 65–99)
Glucose-Capillary: 348 mg/dL — ABNORMAL HIGH (ref 65–99)

## 2017-06-19 DIAGNOSIS — E10621 Type 1 diabetes mellitus with foot ulcer: Secondary | ICD-10-CM | POA: Diagnosis not present

## 2017-06-19 LAB — GLUCOSE, CAPILLARY
GLUCOSE-CAPILLARY: 117 mg/dL — AB (ref 65–99)
GLUCOSE-CAPILLARY: 451 mg/dL — AB (ref 65–99)
Glucose-Capillary: 205 mg/dL — ABNORMAL HIGH (ref 65–99)

## 2017-06-20 ENCOUNTER — Telehealth: Payer: Self-pay | Admitting: *Deleted

## 2017-06-20 DIAGNOSIS — E10621 Type 1 diabetes mellitus with foot ulcer: Secondary | ICD-10-CM | POA: Diagnosis not present

## 2017-06-20 LAB — GLUCOSE, CAPILLARY
GLUCOSE-CAPILLARY: 291 mg/dL — AB (ref 65–99)
Glucose-Capillary: 314 mg/dL — ABNORMAL HIGH (ref 65–99)

## 2017-06-20 NOTE — Telephone Encounter (Signed)
"  We haven't received a history and physical form from her primary care doctor."  I will check on it.    I attempted to call the patient.  We need her to have her history and physical form completed per [name] Gauss at [facility].  I was also informed by [name] Gauss, Pre-admit nurse, that her glucose levels were extremely high, (483 mg/dl).  She will need to get that under control as well.  She will not be able to have surgery with her levels being so high. I asked Ms. Goins to give me a call back.  I attempted to call the patient again.  I left her a message to call me back tomorrow.

## 2017-06-21 ENCOUNTER — Telehealth: Payer: Self-pay | Admitting: Endocrinology

## 2017-06-21 DIAGNOSIS — E10621 Type 1 diabetes mellitus with foot ulcer: Secondary | ICD-10-CM | POA: Diagnosis not present

## 2017-06-21 LAB — GLUCOSE, CAPILLARY: Glucose-Capillary: 262 mg/dL — ABNORMAL HIGH (ref 65–99)

## 2017-06-21 NOTE — Telephone Encounter (Signed)
Patient has a question about her [place] sensor. Please advise

## 2017-06-23 ENCOUNTER — Encounter: Payer: Self-pay | Admitting: Podiatry

## 2017-06-24 ENCOUNTER — Other Ambulatory Visit: Payer: Self-pay | Admitting: *Deleted

## 2017-06-24 DIAGNOSIS — E10621 Type 1 diabetes mellitus with foot ulcer: Secondary | ICD-10-CM | POA: Diagnosis not present

## 2017-06-24 LAB — GLUCOSE, CAPILLARY
GLUCOSE-CAPILLARY: 374 mg/dL — AB (ref 65–99)
GLUCOSE-CAPILLARY: 189 mg/dL — AB (ref 65–99)
GLUCOSE-CAPILLARY: 204 mg/dL — AB (ref 65–99)

## 2017-06-24 MED ORDER — INSULIN GLARGINE 100 UNIT/ML SOLOSTAR PEN: 12.0000 [IU] | PEN_INJECTOR | Freq: Every day | SUBCUTANEOUS | 0 refills | Status: DC

## 2017-06-24 NOTE — Telephone Encounter (Signed)
Patient called today and stated she was told Medicare would pay for her to get a libre with 14 day sensors but she does not know how- could you please guide this patient on how or where to order this so that Medicare will cover it?

## 2017-06-24 NOTE — Telephone Encounter (Signed)
I attempted to call the patient again.  She has sent [name] an email in [place] stating she hasn't heard from anyone about the surgery.  I am going to respond to the email.  Hopefully I will get a response.  I responded to the patient's email in [place].  I asked her to give me a call.  I also informed her of the date and time of surgery.  I asked her to contact [name] Gauss, Pre-Admit Nurse, as well as myself

## 2017-06-25 DIAGNOSIS — E10621 Type 1 diabetes mellitus with foot ulcer: Secondary | ICD-10-CM | POA: Diagnosis not present

## 2017-06-25 LAB — GLUCOSE, CAPILLARY
GLUCOSE-CAPILLARY: 191 mg/dL — AB (ref 65–99)
Glucose-Capillary: 209 mg/dL — ABNORMAL HIGH (ref 65–99)

## 2017-06-25 NOTE — Telephone Encounter (Signed)
"  We need orders for her surgery.  Surgery is scheduled for Thursday.  We have not received her history and physical either."

## 2017-06-26 ENCOUNTER — Encounter: Payer: Self-pay | Admitting: *Deleted

## 2017-06-26 ENCOUNTER — Encounter (HOSPITAL_BASED_OUTPATIENT_CLINIC_OR_DEPARTMENT_OTHER): Payer: Self-pay

## 2017-06-26 ENCOUNTER — Other Ambulatory Visit: Payer: Self-pay

## 2017-06-26 DIAGNOSIS — E10621 Type 1 diabetes mellitus with foot ulcer: Secondary | ICD-10-CM | POA: Diagnosis not present

## 2017-06-26 LAB — GLUCOSE, CAPILLARY
Glucose-Capillary: 142 mg/dL — ABNORMAL HIGH (ref 65–99)
Glucose-Capillary: 127 mg/dL — ABNORMAL HIGH (ref 65–99)

## 2017-06-26 NOTE — Progress Notes (Signed)
Spoke with:  Chriselda NPO:  No food after midnight/Clear liquids until 8:00AM DOS Arrival time: 1215p Labs: UPT, Istat4 AM medications: None Pre op orders: Yes Ride home: Guadelupe Sabin (Friend) [phone]

## 2017-06-26 NOTE — Pre-Procedure Instructions (Signed)
Spoke with Delydia, H&P is being reviewed at this time by [name].  She will call me back once he has reviewed.

## 2017-06-26 NOTE — Telephone Encounter (Signed)
I received a progress note from Dr. Mellody Drown.

## 2017-06-26 NOTE — Telephone Encounter (Signed)
"  I am calling to see if you have the patient's history and physical form."  We just received it from Dr. Mellody Drown.  I gave it to [name] to review.  I will let you know which direction we will go once he reviews it.  "Okay give me a call back at this number and leave a voicemail if I don't answer.  The voicemail may say [name] Gauss, I am filling in for."  I called Garnette Czech Surgery Scheduler, and canceled patient's surgery for tomorrow due to patient's glucose not being under control.  I also call and left a message for Tamiko that we have canceled patient's surgery for tomorrow due to her not getting medical clearance.  Her glucose levels are not stable.  She needs Vascular, Endocrinology, and Nephrology clearance.  I left the patient a message that we were going to cancel her appointment for tomorrow because she needs to get medical clearance.  I asked her to call me back if she has questions.  I also asked her to call and schedule an appointment with [name] for follow-up.  I also sent her a message via [place].

## 2017-06-26 NOTE — Telephone Encounter (Signed)
"  I just got my primary care physician to do my physical today.  He doesn't understand why you need since I have already been pre-authorized to have surgery like on the 11th.  Any ways, I don't know exactly what you guys needed from him but labwork was coming from my kidney doctor and my Endocrinologist, he filled out the paperwork and sent it to you.  He was supposed to have sent it to you.  That's all I can do.  I hope you guys get it, hope I get to do my surgery because I have been waiting on this for a while."  I attempted to return her call.  I left her a message to call me back.  I have not received a history and physical form.  I am not sure if she had them to send it to the surgical center.

## 2017-06-27 ENCOUNTER — Ambulatory Visit (HOSPITAL_BASED_OUTPATIENT_CLINIC_OR_DEPARTMENT_OTHER): Admission: RE | Admit: 2017-06-27 | Payer: Medicare Other | Source: Ambulatory Visit | Admitting: Podiatry

## 2017-06-27 ENCOUNTER — Telehealth: Payer: Self-pay | Admitting: *Deleted

## 2017-06-27 ENCOUNTER — Encounter: Payer: Self-pay | Admitting: Podiatry

## 2017-06-27 HISTORY — DX: Sleep apnea, unspecified: G47.30

## 2017-06-27 HISTORY — DX: Unspecified osteoarthritis, unspecified site: M19.90

## 2017-06-27 HISTORY — DX: Bronchitis, not specified as acute or chronic: J40

## 2017-06-27 SURGERY — IRRIGATION AND DEBRIDEMENT FOOT
Anesthesia: Monitor Anesthesia Care | Laterality: Left

## 2017-06-27 NOTE — Telephone Encounter (Signed)
I am calling to let you know the decision has been made not to do the surgery.  [name] spoke to [facility] and they both agree surgery is too risky without taking precautions.  They want you to have some other tests done.  (Patient hung up on me.)

## 2017-06-27 NOTE — Telephone Encounter (Signed)
"  I am calling in regards to the surgery I was supposed to have today at 2:15pm with [name].  The surgery was canceled due to the PCP not clearing me for surgery.  I talked to the PCP and he said I don't understand why they didn't let you do surgery because I didn't say you couldn't go.  I talked to [name] last night because he was the doctor on call and he said if I could get an email consent saying it's okay for me to do surgery even if it's at high risk, I can still do my surgery today at 2:15pm.  I've called everybody and tried to make everything happen that's supposed to happen.  I don't know what's going on.  I need to have someone give me a call to let me know if the doctor calls.  I know he can't talk to [name] because he's in surgery all day.  So someone needs to get this information and make this happen for me.  I've gone through too much work to not have the surgery today.

## 2017-06-27 NOTE — Telephone Encounter (Signed)
"  I have been freaking out about this whole surgery being canceled.  I talked to [name] last night.  He said if I could get clearance from the primary care physician for the surgery this morning I could still get it done.  I called the doctor.  He doesn't know what's going on because he put in his note that he was hesitant about clearing me for surgery but I should still be able to do the surgery.  It's up to [name] whether I have it or not.  He's going to send me an email giving me clearance for a high risk, I guess surgery.  Please give me a call.  I'll be in the hyperbaric oxygen tank from 10 am to 12:30 pm."  "I'm calling again.  I'm not having hyperbaric today because my 30 days was up.  I really need to get this figured out to see if I'm going to have surgery at 2:15 pm today.  Please give me a call back as soon as you get in the office."  I am returning your call.  As we speak, surgery is still canceled.  [name] canceled because you are at risk to have surgery.  It was recommended that you get clearance from your Endocrinologist, Kidney doctor and also Vascular clearance.  "I talked to [name] last night and he said if I got a note that said I was at high risk but I can still have surgery he would do it."  I am not aware of this, I am not able to do anything without [name]' order.  Dr. Mellody Drown has called asking to speak to Dr.Evans.  "He can't call [name] because he's in surgery all day."  I am going to get in contact with [name] and get him to call him.  Once [name] speaks to him, he will let me know what to do.  Until then, my hands are tied.  "Well keep me informed.  I'm waiting here at the hospital until I hear something.  It doesn't make since for me to leave and have to come back."

## 2017-06-27 NOTE — Telephone Encounter (Signed)
[name], [name] states he saw pt 2 days ago, and had sent a note stating pt is a high risk for surgical intervention with multiple high risk diagnosis/problems, and has not been seen in office for over 1 years. Mellody Drown, [place] states pt informed him that [name] has said if he would send a note stating she was high risk, that [name] would perform the surgery. Mellody Drown, [place] states he would like to speak with [name] concerning pt, [phone].

## 2017-07-02 ENCOUNTER — Encounter (HOSPITAL_BASED_OUTPATIENT_CLINIC_OR_DEPARTMENT_OTHER): Payer: Medicare Other | Attending: Internal Medicine

## 2017-07-02 DIAGNOSIS — Z86718 Personal history of other venous thrombosis and embolism: Secondary | ICD-10-CM | POA: Insufficient documentation

## 2017-07-02 DIAGNOSIS — E10621 Type 1 diabetes mellitus with foot ulcer: Secondary | ICD-10-CM | POA: Diagnosis not present

## 2017-07-02 DIAGNOSIS — E1040 Type 1 diabetes mellitus with diabetic neuropathy, unspecified: Secondary | ICD-10-CM | POA: Diagnosis not present

## 2017-07-02 DIAGNOSIS — E1052 Type 1 diabetes mellitus with diabetic peripheral angiopathy with gangrene: Secondary | ICD-10-CM | POA: Diagnosis present

## 2017-07-02 DIAGNOSIS — G4730 Sleep apnea, unspecified: Secondary | ICD-10-CM | POA: Insufficient documentation

## 2017-07-02 DIAGNOSIS — M86072 Acute hematogenous osteomyelitis, left ankle and foot: Secondary | ICD-10-CM | POA: Insufficient documentation

## 2017-07-02 DIAGNOSIS — E1051 Type 1 diabetes mellitus with diabetic peripheral angiopathy without gangrene: Secondary | ICD-10-CM | POA: Diagnosis not present

## 2017-07-02 DIAGNOSIS — L97519 Non-pressure chronic ulcer of other part of right foot with unspecified severity: Secondary | ICD-10-CM | POA: Insufficient documentation

## 2017-07-02 DIAGNOSIS — L97525 Non-pressure chronic ulcer of other part of left foot with muscle involvement without evidence of necrosis: Secondary | ICD-10-CM | POA: Insufficient documentation

## 2017-07-02 DIAGNOSIS — A480 Gas gangrene: Secondary | ICD-10-CM | POA: Diagnosis not present

## 2017-07-03 ENCOUNTER — Encounter: Payer: Medicare Other | Admitting: Podiatry

## 2017-07-03 ENCOUNTER — Ambulatory Visit (INDEPENDENT_AMBULATORY_CARE_PROVIDER_SITE_OTHER): Payer: Medicare Other | Admitting: Internal Medicine

## 2017-07-03 ENCOUNTER — Encounter: Payer: Self-pay | Admitting: Internal Medicine

## 2017-07-03 ENCOUNTER — Other Ambulatory Visit: Payer: Self-pay

## 2017-07-03 DIAGNOSIS — I70235 Atherosclerosis of native arteries of right leg with ulceration of other part of foot: Secondary | ICD-10-CM | POA: Diagnosis not present

## 2017-07-03 DIAGNOSIS — L089 Local infection of the skin and subcutaneous tissue, unspecified: Secondary | ICD-10-CM | POA: Diagnosis not present

## 2017-07-03 DIAGNOSIS — E11628 Type 2 diabetes mellitus with other skin complications: Secondary | ICD-10-CM

## 2017-07-03 NOTE — Assessment & Plan Note (Signed)
She has a severe, deep diabetic infection of her left foot but it is improving slowly.  I am a little concerned about her being on levofloxacin and metronidazole for so long but agree that she will continue both for now.  I have warned her to notify me immediately if she is concerned about any potential new side effects.  She will follow-up here in 1 month.

## 2017-07-03 NOTE — Progress Notes (Signed)
[facility] for Infectious Disease  Patient Active Problem List   Diagnosis Date Noted  . Diabetic infection of left foot ([place]) 10/13/2014    Priority: High  . Status post transmetatarsal amputation of right foot ([place]) 09/26/2014    Priority: High  . 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 Gangrene of left foot ([place]) 05/08/2017  . Osteomyelitis of left foot ([place]) 05/08/2017  . Diabetic foot infection ([place])   . Diabetic foot ulcer ([place]) 05/05/2017  . Cellulitis 05/04/2017  . Diabetes mellitus type 1 with peripheral artery disease ([place]) 01/16/2017  . Heart murmur 11/03/2015  . Protein-calorie malnutrition, moderate ([place]) 10/18/2014  . History of DVT (deep vein thrombosis)   . Chronic anemia 10/13/2014  . DM type 1 causing renal disease ([place]) 06/13/2013  . CKD (chronic kidney disease), stage III ([place]) 06/12/2013  . Charcot foot due to diabetes mellitus ([place]) 05/09/2011  . Type 1 diabetes mellitus with neurological manifestations, uncontrolled ([place])   . Hypertension   . Hypercholesteremia     Patient's Medications  New Prescriptions   No medications on file  Previous Medications   ALBUTEROL (PROVENTIL HFA;VENTOLIN HFA) 108 (90 BASE) MCG/ACT INHALER    Inhale 2 puffs into the lungs every 6 (six) hours as needed for wheezing or shortness of breath. Reported on 04/15/2015   AMLODIPINE (NORVASC) 5 MG TABLET    Take 1 tablet (5 mg total) by mouth daily.   ASPIRIN 81 MG TABLET    Take 81 mg by mouth every evening.    CALCITRIOL (ROCALTROL) 0.5 MCG CAPSULE    Take 0.5 mcg by mouth every evening.    CALCIUM-PHOSPHORUS-VITAMIN D (CALCIUM GUMMIES) [id number] MG-MG-UNIT CHEW    Chew 2 each by mouth every evening.    CINNAMON PO    Take 1 tablet by mouth every evening.    CONTINUOUS BLOOD GLUC RECEIVER (FREESTYLE LIBRE READER) DEVI    1 Device by Does not apply route as directed.   CONTINUOUS BLOOD GLUC SENSOR (FREESTYLE LIBRE SENSOR SYSTEM) MISC     Use 1 sensor every 10 days   CONTOUR NEXT TEST TEST STRIP    USE AS INSTRUCTED TO CHECK BLOOD SUGAR 4 TIMES A DAY. DX E10.65   ERGOCALCIFEROL (VITAMIN D2) 50000 UNITS CAPSULE    Take 1 capsule (50,000 Units total) by mouth once a week.   FOLIC ACID (FOLVITE) 1 MG TABLET    Take 1 mg by mouth every evening.    FUROSEMIDE (LASIX) 40 MG TABLET    Take 40 mg by mouth daily as needed for fluid.    HUMALOG 100 UNIT/ML INJECTION    CALL MD IF <70, IF 151-200 =2UNITS,201-250 =4 UNITS,251-300 =6 UNITS, 301-350 =8 UNITS,351-400 =10 U   INSULIN PEN NEEDLE (PEN NEEDLES) 32G X 4 MM MISC    1 Device by Does not apply route 2 (two) times daily.   LEVOFLOXACIN (LEVAQUIN) 500 MG TABLET    TAKE 1 TABLET BY MOUTH EVERY DAY FOR 2 MORE WEEKS   LORATADINE (CLARITIN) 10 MG TABLET    Take 1 tablet (10 mg total) by mouth daily.   METRONIDAZOLE (FLAGYL) 500 MG TABLET    TAKE 1 TABLET BY MOUTH 3 TIMES A DAY   NORETHINDRONE (AYGESTIN) 5 MG TABLET    Take by mouth.   ONDANSETRON (ZOFRAN-ODT) 4 MG DISINTEGRATING TABLET    DISSOLVE 1 TABLET ON TONGUE EVERY 8 HOURS  AS NEEDED FOR NAUSEA   PRAVASTATIN (PRAVACHOL) 40 MG TABLET    Take 1 tablet (40 mg total) by mouth daily.   PYRIDOXINE (B-6) 100 MG TABLET    Take 100 mg by mouth daily. Reported on 04/15/2015   VITAMIN C (ASCORBIC ACID) 500 MG TABLET    Take 500 mg by mouth daily.  Modified Medications   No medications on file  Discontinued Medications   GABAPENTIN (NEURONTIN) 300 MG CAPSULE    Take 1 capsule (300 mg total) by mouth 2 (two) times daily.    Subjective: Danuta is in for her routine follow-up visit.  She continues to take levofloxacin and metronidazole for her left foot infection she has now been on treatment for about 60 days.  She denies any problems tolerating her antibiotics.  She completed 30 days of hyperbaric oxygen therapy.  She is pleased with the progress she has made.  She did not agreed to BKA when she was in the hospital.  She had discussed the  possibility of a transmetatarsal amputation with her podiatrist, [name] but given recent improvement the plan had been changed to debridement and skin grafting.  That had been scheduled for 06/27/2017 but she was not given clearance by her primary care provider so that surgery was canceled.  She has been working with Dr. Dossie Der at the wound center.  She has been told that he might do debridement and grafting there and that she might get approval to restart hyperbaric oxygen therapy.  Review of Systems: Review of Systems  Constitutional: Negative for chills, diaphoresis and fever.  Gastrointestinal: Negative for abdominal pain, diarrhea, nausea and vomiting.  Musculoskeletal: Positive for joint pain.  Neurological: Positive for sensory change.    Past Medical History:  Diagnosis Date  . Anemia   . Arthritis   . Bronchitis   . C. difficile diarrhea 09/26/2014  . Cataracts, both eyes   . Cellulitis of right foot 06/02/2014  . CKD (chronic kidney disease) stage 3, GFR 30-59 ml/min ([facility])    sees Dr Justin Mend- Stage IV  . Diabetic ulcer of right foot ([place]) 06/02/2014  . DVT (deep venous thrombosis) ([place]) 10/2014   one in each one in leg- PICC line , one in right and left arm  . H/O seasonal allergies   . Heart murmur    told once when she was pregnant- no furter mention- was in notes 11/2015- had Echo 8 /4/17  . History of blood transfusion   . Hypercholesteremia   . Hypertension   . Left foot infection   . Neuropathy    feet  . Neuropathy in diabetes ([place])   . Peripheral vascular disease ([place])   . Sleep apnea    not able to use CPAP  . Staphylococcus aureus bacteremia 10/2014  . Type 1 diabetes [facility])    onset age 62    Social History   Tobacco Use  . Smoking status: Former Smoker    Years: 15.00    Last attempt to quit: 05/16/2008    Years since quitting: 9.1  . Smokeless tobacco: Never Used  Substance Use Topics  . Alcohol use: Yes    Comment: Occasional  . Drug use:  No    Family History  Problem Relation Age of Onset  . Hyperlipidemia Mother   . Dementia Mother     Allergies  Allergen Reactions  . Cleocin [Clindamycin Hcl] Diarrhea    Diarrhea   . Lisinopril Other (See Comments)  Elevated potassium per pt report  . Amoxicillin Diarrhea and Nausea Only    Has patient had a PCN reaction causing immediate rash, facial/tongue/throat swelling, SOB or lightheadedness with hypotension: No Has patient had a PCN reaction causing severe rash involving mucus membranes or skin necrosis: No Has patient had a PCN reaction that required hospitalization: No Has patient had a PCN reaction occurring within the last 10 years: No If all of the above answers are "NO", then may proceed with Cephalosporin use.   . Bactrim [Sulfamethoxazole-Trimethoprim] Diarrhea and Nausea Only    Objective: Vitals:   07/03/17 1416  BP: (!) 148/88  Pulse: 89  Temp: 98.3 F (36.8 C)  TempSrc: Oral  Weight: 125 lb (56.7 kg)  Height: 5\' 7"  (1.702 m)   Body mass index is 19.58 kg/m.  Physical Exam  Constitutional: She is oriented to person, place, and time.  She is talkative and in good spirits as usual.  Musculoskeletal:  Right foot: She has 1 very superficial 10 mm ulcer under her first metatarsal stump.  There is no odor or drainage.  Left foot her fifth toe and ray is surgically absent.  Wound has shown significant improvement but is still open from the midfoot distally.  There is some drainage on the dressing.  There is no odor or surrounding cellulitis.  There is no exposed bone.  Neurological: She is alert and oriented to person, place, and time.  Skin: No rash noted.  Psychiatric: Mood and affect normal.    Lab Results    Problem List Items Addressed This Visit      High   Diabetic infection of left foot ([place])    She has a severe, deep diabetic infection of her left foot but it is improving slowly.  I am a little concerned about her being on levofloxacin  and metronidazole for so long but agree that she will continue both for now.  I have warned her to notify me immediately if she is concerned about any potential new side effects.  She will follow-up here in 1 month.      Relevant Medications   metroNIDAZOLE (FLAGYL) 500 MG tablet       Michel Bickers, MD [facility] for Infectious [place] [phone] pager   [phone] cell 07/03/2017, 2:45 PM

## 2017-07-03 NOTE — Telephone Encounter (Signed)
Pump was downloaded and paperwork was filled out and put on Dr. Ronnie Derby desk to sign for [place] 14 day sensor.

## 2017-07-04 ENCOUNTER — Encounter: Payer: Self-pay | Admitting: Podiatry

## 2017-07-04 ENCOUNTER — Encounter: Payer: Self-pay | Admitting: Endocrinology

## 2017-07-07 ENCOUNTER — Other Ambulatory Visit: Payer: Self-pay | Admitting: Endocrinology

## 2017-07-08 ENCOUNTER — Encounter: Payer: Self-pay | Admitting: Endocrinology

## 2017-07-09 ENCOUNTER — Telehealth: Payer: Self-pay | Admitting: *Deleted

## 2017-07-09 DIAGNOSIS — E1052 Type 1 diabetes mellitus with diabetic peripheral angiopathy with gangrene: Secondary | ICD-10-CM | POA: Diagnosis not present

## 2017-07-09 LAB — GLUCOSE, CAPILLARY: Glucose-Capillary: 172 mg/dL — ABNORMAL HIGH (ref 65–99)

## 2017-07-09 NOTE — Telephone Encounter (Signed)
I left the patient a message to give me a call back.  I informed her we received clearance from Endocrinology.

## 2017-07-10 ENCOUNTER — Encounter: Payer: Medicare Other | Admitting: Podiatry

## 2017-07-10 DIAGNOSIS — E1052 Type 1 diabetes mellitus with diabetic peripheral angiopathy with gangrene: Secondary | ICD-10-CM | POA: Diagnosis not present

## 2017-07-10 LAB — GLUCOSE, CAPILLARY
Glucose-Capillary: 254 mg/dL — ABNORMAL HIGH (ref 65–99)
Glucose-Capillary: 164 mg/dL — ABNORMAL HIGH (ref 65–99)
Glucose-Capillary: 200 mg/dL — ABNORMAL HIGH (ref 65–99)

## 2017-07-11 DIAGNOSIS — E1052 Type 1 diabetes mellitus with diabetic peripheral angiopathy with gangrene: Secondary | ICD-10-CM | POA: Diagnosis not present

## 2017-07-11 LAB — GLUCOSE, CAPILLARY
Glucose-Capillary: 240 mg/dL — ABNORMAL HIGH (ref 65–99)
Glucose-Capillary: 287 mg/dL — ABNORMAL HIGH (ref 65–99)

## 2017-07-12 DIAGNOSIS — E1052 Type 1 diabetes mellitus with diabetic peripheral angiopathy with gangrene: Secondary | ICD-10-CM | POA: Diagnosis not present

## 2017-07-12 LAB — GLUCOSE, CAPILLARY
GLUCOSE-CAPILLARY: 159 mg/dL — AB (ref 65–99)
Glucose-Capillary: 259 mg/dL — ABNORMAL HIGH (ref 65–99)

## 2017-07-13 ENCOUNTER — Encounter: Payer: Self-pay | Admitting: Podiatry

## 2017-07-15 ENCOUNTER — Telehealth: Payer: Self-pay

## 2017-07-15 DIAGNOSIS — E1052 Type 1 diabetes mellitus with diabetic peripheral angiopathy with gangrene: Secondary | ICD-10-CM | POA: Diagnosis not present

## 2017-07-15 LAB — GLUCOSE, CAPILLARY
Glucose-Capillary: 230 mg/dL — ABNORMAL HIGH (ref 65–99)
Glucose-Capillary: 197 mg/dL — ABNORMAL HIGH (ref 65–99)

## 2017-07-15 NOTE — Telephone Encounter (Signed)
Attempted to call patient, had to leave voice mail informing her to call office in the morning and speak with nurse about whats going on.  She may need to be evaluated.

## 2017-07-16 ENCOUNTER — Other Ambulatory Visit: Payer: Self-pay | Admitting: *Deleted

## 2017-07-16 DIAGNOSIS — E1052 Type 1 diabetes mellitus with diabetic peripheral angiopathy with gangrene: Secondary | ICD-10-CM | POA: Diagnosis not present

## 2017-07-16 LAB — GLUCOSE, CAPILLARY
Glucose-Capillary: 213 mg/dL — ABNORMAL HIGH (ref 65–99)
Glucose-Capillary: 329 mg/dL — ABNORMAL HIGH (ref 65–99)

## 2017-07-16 MED ORDER — INSULIN GLARGINE 100 UNIT/ML SOLOSTAR PEN: PEN_INJECTOR | SUBCUTANEOUS | 1 refills | Status: DC

## 2017-07-17 DIAGNOSIS — E1052 Type 1 diabetes mellitus with diabetic peripheral angiopathy with gangrene: Secondary | ICD-10-CM | POA: Diagnosis not present

## 2017-07-17 LAB — GLUCOSE, CAPILLARY
GLUCOSE-CAPILLARY: 144 mg/dL — AB (ref 65–99)
GLUCOSE-CAPILLARY: 232 mg/dL — AB (ref 65–99)

## 2017-07-18 DIAGNOSIS — E1052 Type 1 diabetes mellitus with diabetic peripheral angiopathy with gangrene: Secondary | ICD-10-CM | POA: Diagnosis not present

## 2017-07-18 LAB — GLUCOSE, CAPILLARY
Glucose-Capillary: 417 mg/dL — ABNORMAL HIGH (ref 65–99)
Glucose-Capillary: 280 mg/dL — ABNORMAL HIGH (ref 65–99)

## 2017-07-19 DIAGNOSIS — E1052 Type 1 diabetes mellitus with diabetic peripheral angiopathy with gangrene: Secondary | ICD-10-CM | POA: Diagnosis not present

## 2017-07-19 LAB — GLUCOSE, CAPILLARY
Glucose-Capillary: 140 mg/dL — ABNORMAL HIGH (ref 65–99)
Glucose-Capillary: 246 mg/dL — ABNORMAL HIGH (ref 65–99)

## 2017-07-21 IMAGING — DX DG CHEST 2V
2 series · 2 of 2 positions shown · non-contrast
Comparison: 09/01/2014

CLINICAL DATA: Left posterior chest pain for 2 days. Ex-smoker.
Possible injury during sex.

EXAM:
CHEST  2 VIEW

[chest pa]
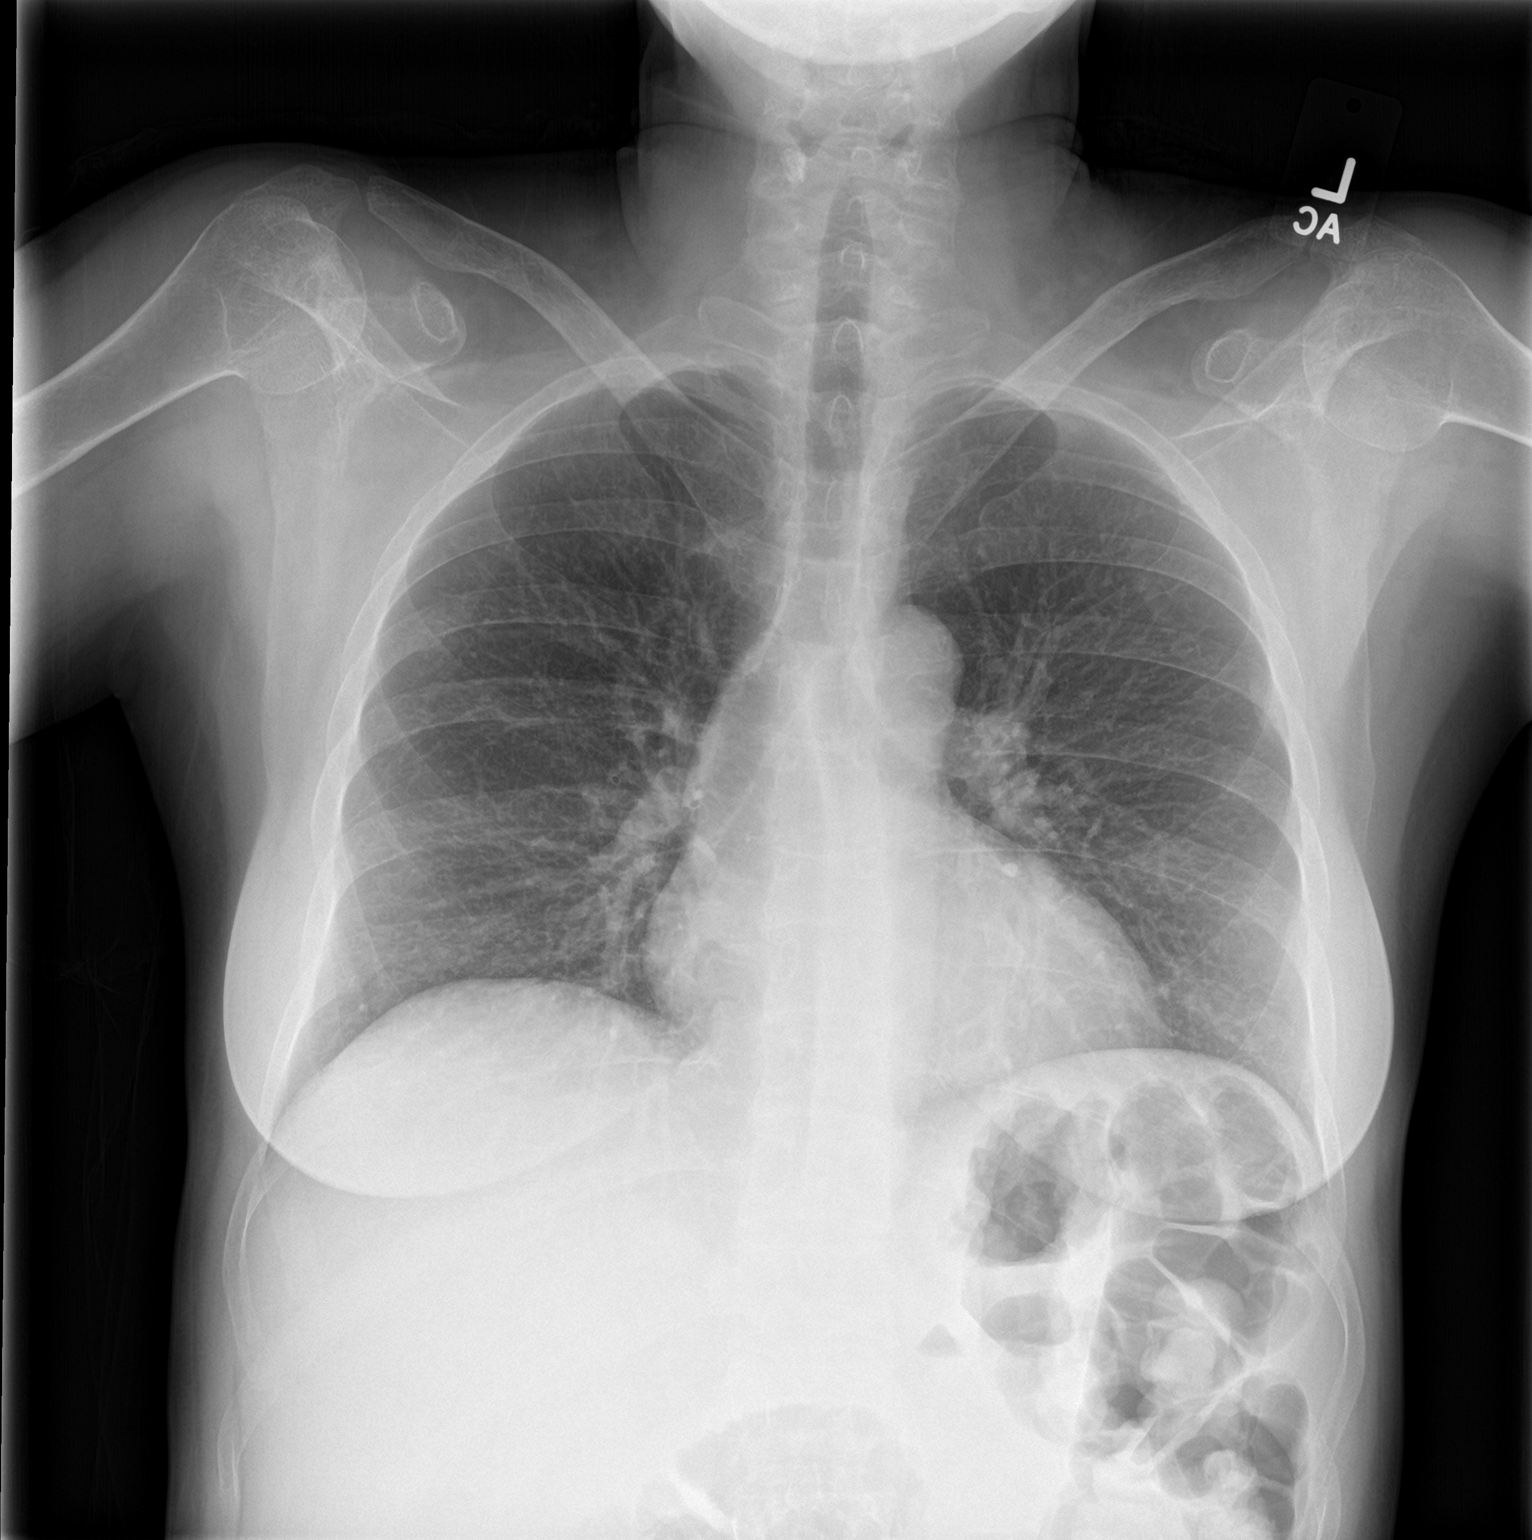

[chest lat]
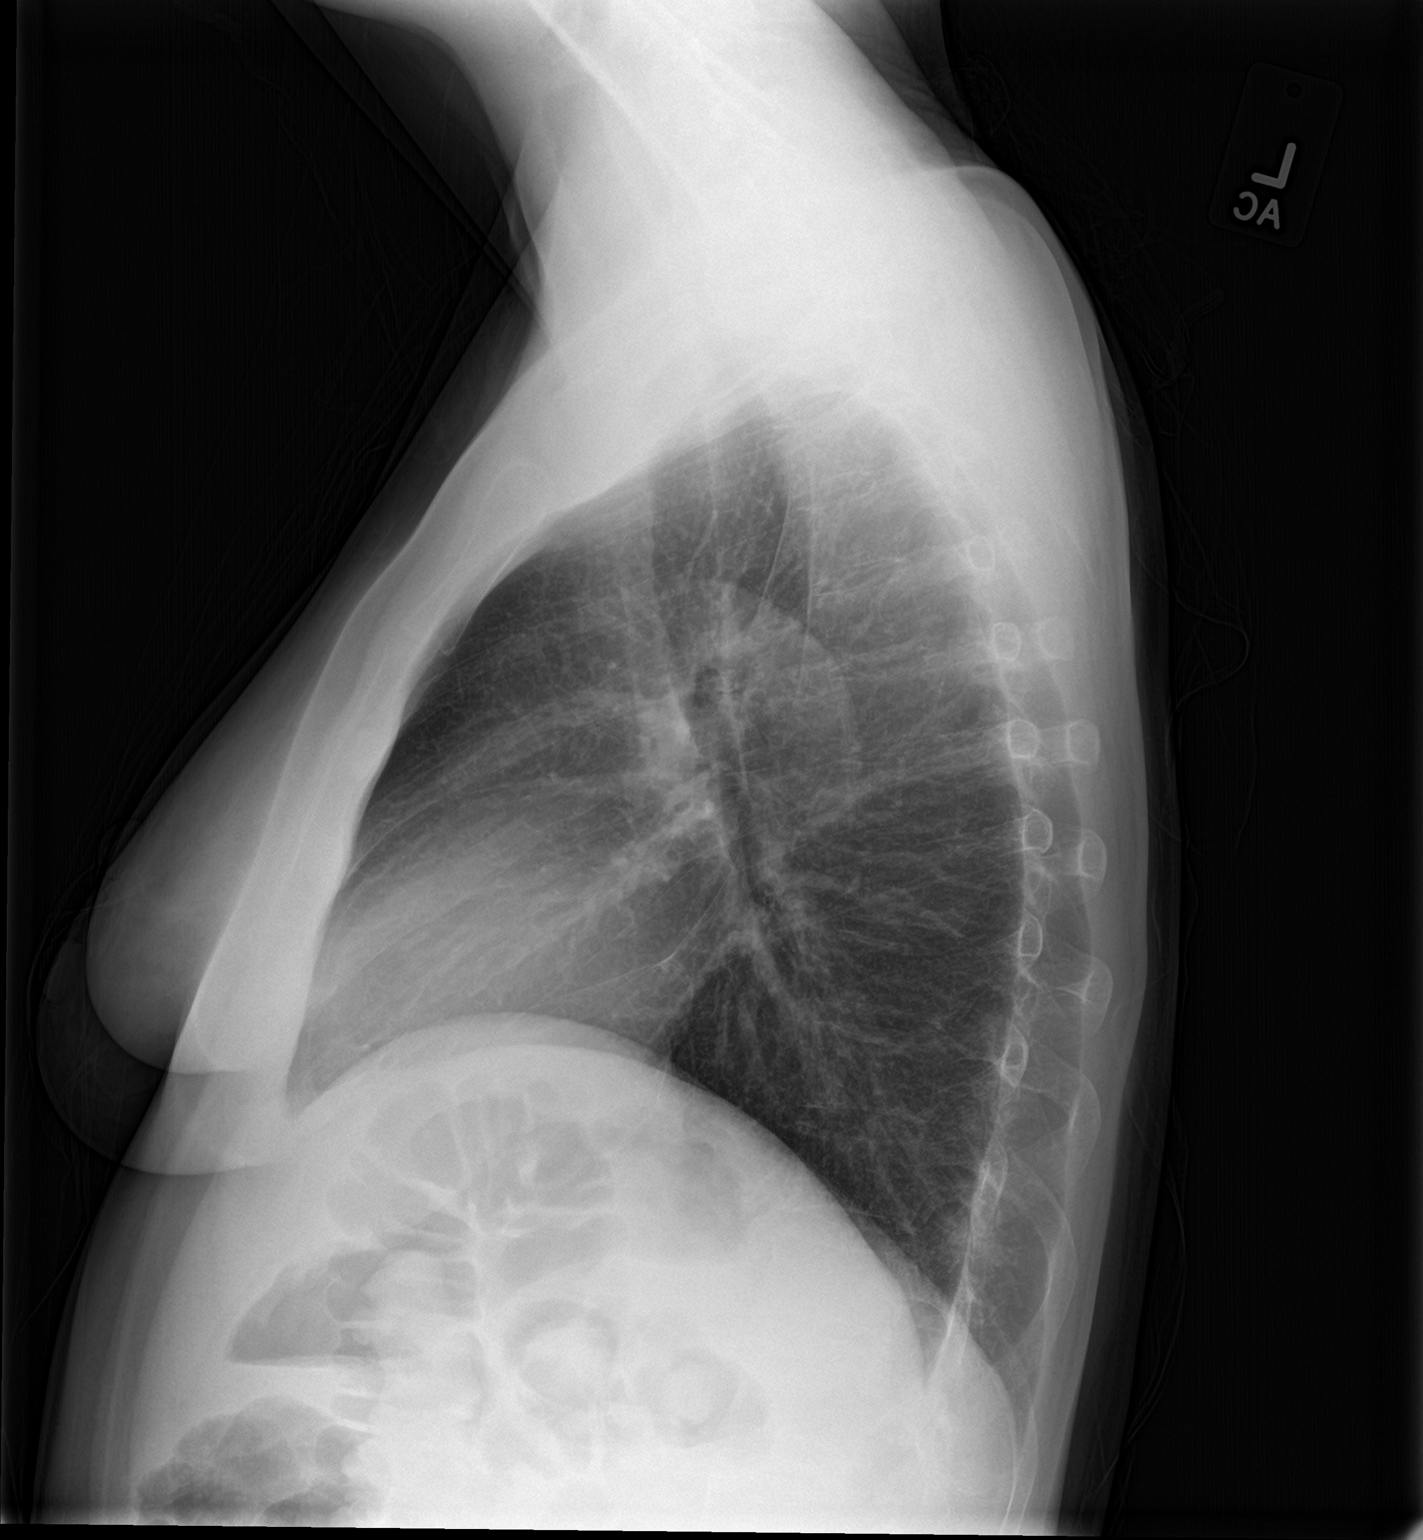

[2 of 2 positions shown; findings below may reference images not displayed]

FINDINGS: The heart size and mediastinal contours are within normal limits.
Both lungs are clear. The visualized skeletal structures are
unremarkable.
IMPRESSION: No active cardiopulmonary disease.

## 2017-07-22 DIAGNOSIS — E1052 Type 1 diabetes mellitus with diabetic peripheral angiopathy with gangrene: Secondary | ICD-10-CM | POA: Diagnosis not present

## 2017-07-22 LAB — GLUCOSE, CAPILLARY
GLUCOSE-CAPILLARY: 333 mg/dL — AB (ref 65–99)
Glucose-Capillary: 475 mg/dL — ABNORMAL HIGH (ref 65–99)

## 2017-07-23 ENCOUNTER — Other Ambulatory Visit (INDEPENDENT_AMBULATORY_CARE_PROVIDER_SITE_OTHER): Payer: Medicare Other

## 2017-07-23 DIAGNOSIS — E1065 Type 1 diabetes mellitus with hyperglycemia: Secondary | ICD-10-CM

## 2017-07-23 DIAGNOSIS — E1052 Type 1 diabetes mellitus with diabetic peripheral angiopathy with gangrene: Secondary | ICD-10-CM | POA: Diagnosis not present

## 2017-07-23 LAB — GLUCOSE, RANDOM: GLUCOSE: 365 mg/dL — AB (ref 70–99)

## 2017-07-23 LAB — GLUCOSE, CAPILLARY
Glucose-Capillary: 398 mg/dL — ABNORMAL HIGH (ref 65–99)
Glucose-Capillary: 406 mg/dL — ABNORMAL HIGH (ref 65–99)

## 2017-07-24 DIAGNOSIS — E1052 Type 1 diabetes mellitus with diabetic peripheral angiopathy with gangrene: Secondary | ICD-10-CM | POA: Diagnosis not present

## 2017-07-24 LAB — GLUCOSE, CAPILLARY
GLUCOSE-CAPILLARY: 125 mg/dL — AB (ref 65–99)
GLUCOSE-CAPILLARY: 329 mg/dL — AB (ref 65–99)
Glucose-Capillary: 150 mg/dL — ABNORMAL HIGH (ref 65–99)

## 2017-07-24 LAB — FRUCTOSAMINE: Fructosamine: 379 umol/L — ABNORMAL HIGH (ref 0–285)

## 2017-07-25 ENCOUNTER — Encounter (HOSPITAL_COMMUNITY): Payer: Medicare Other

## 2017-07-25 DIAGNOSIS — E1052 Type 1 diabetes mellitus with diabetic peripheral angiopathy with gangrene: Secondary | ICD-10-CM | POA: Diagnosis not present

## 2017-07-25 LAB — GLUCOSE, CAPILLARY
GLUCOSE-CAPILLARY: 289 mg/dL — AB (ref 65–99)
GLUCOSE-CAPILLARY: 212 mg/dL — AB (ref 65–99)

## 2017-07-26 ENCOUNTER — Ambulatory Visit (INDEPENDENT_AMBULATORY_CARE_PROVIDER_SITE_OTHER): Payer: Medicare Other | Admitting: Endocrinology

## 2017-07-26 ENCOUNTER — Encounter: Payer: Self-pay | Admitting: Endocrinology

## 2017-07-26 VITALS — BP 100/64 | HR 81 | Ht 67.0 in | Wt 122.6 lb

## 2017-07-26 DIAGNOSIS — E1065 Type 1 diabetes mellitus with hyperglycemia: Secondary | ICD-10-CM | POA: Diagnosis not present

## 2017-07-26 DIAGNOSIS — I70235 Atherosclerosis of native arteries of right leg with ulceration of other part of foot: Secondary | ICD-10-CM | POA: Diagnosis not present

## 2017-07-26 DIAGNOSIS — E7800 Pure hypercholesterolemia, unspecified: Secondary | ICD-10-CM

## 2017-07-26 DIAGNOSIS — E1052 Type 1 diabetes mellitus with diabetic peripheral angiopathy with gangrene: Secondary | ICD-10-CM | POA: Diagnosis not present

## 2017-07-26 LAB — GLUCOSE, CAPILLARY
Glucose-Capillary: 160 mg/dL — ABNORMAL HIGH (ref 65–99)
Glucose-Capillary: 206 mg/dL — ABNORMAL HIGH (ref 65–99)

## 2017-07-26 NOTE — Patient Instructions (Addendum)
Do not suspend pump at [employer] for all Carbs  Compare Libre to Contour

## 2017-07-26 NOTE — Progress Notes (Signed)
Patient ID: [name], female   DOB: Jan 17, 1971, 47 y.o.   MRN: [id number]   Reason for Appointment: follow-up of diabetes  History of Present Illness   Diagnosis: Type 1 DIABETES MELITUS  Previous history: She has had long-standing poorly controlled diabetes and typically has poor compliance with self care measures despite periodic diabetes education and periodic followup  INSULIN PUMP regimen:    Basal rate midnight = 0.55, 3 AM = 0.4, 4 PM = 0.55 and 9 PM = 0.65   Carb coverage 1: 15 g and correction 1: 60 with target 120, active insulin 6 hours         Recent history:   Her last A1c was 10.6 in January fructosamine is now 379  She is checking her blood sugars 4 times a day,  limited by painful finger sticks  Current blood sugar patterns, difficulties with management and problems identified:  She was able to get her freestyle libre sensor but because of her vision difficulties she has not been able to figure out how to use it  Also not sure if all of her sugar readings are being entered in the pump since she did not bring her monitor to verify this has relatively infrequent boluses  She says she forgets to bolus when she is eating especially in the evening has on an average 2 boluses per day but occasionally 1 or none  She still does not know how to program her pump  Also when she has HIGH blood sugars she is not able to bolus more than 10 units because of the limitation in the settings  She claims that she has HYPOGLYCEMIA or low blood sugars waking up frequently but currently not documented consistently in her pump download  She does have a low sugar documented after doing a correction bolus at 2 AM for blood sugar over 500  Because of tendency to lower sugars when she wakes up and the need for having her sugars over 130 prior to her hyperbaric treatment she will either not bolusing of late at night to suspend her pump overnight she has done 3 times  recently  However she will occasionally forget to start back her pump and did not do so yesterday until 7 PM  Blood sugars are fairly consistently high whenever she is checking them with only occasional relatively good readings around 8 AM and once evening  She thinks that blood sugars come down when she is in hyperbaric chamber and this is despite spending the pump; she is made to drink large boost at the wound care center if her blood sugar is below 130 and this will spike her blood sugar up   GLUCOSE readings from insulin pump download:  Mean values apply above for all meters except median for One Touch  PRE-MEAL Fasting Lunch  5-9 PM Bedtime Overall  Glucose range:  58-400+   75-400+  119-400+   Mean/median:      352+/-168   Meals: eating at variable times and also having snacks periodically.  Her daily carbohydrate intake is quite variable Physical activity: exercise: none         Dietician visit: Most recent:?          Complications: are: Neuropathy, nephropathy, diabetic foot ulcer      Wt Readings from Last 3 Encounters:  07/26/17 122 lb 9.6 oz (55.6 kg)  07/03/17 125 lb (56.7 kg)  06/03/17 127 lb (57.6 kg)   Lab Results  Component  Value Date   HGBA1C 10.6 04/08/2017   HGBA1C 10.1 (H) 01/11/2017   HGBA1C 11.6 (H) 10/05/2016   Lab Results  Component Value Date   MICROALBUR 139.6 (H) 10/05/2016   LDLCALC 78 05/02/2016   CREATININE 2.23 (H) 05/14/2017   Lab on 07/23/2017  Component Date Value Ref Range Status  . Glucose, Bld 07/23/2017 365* 70 - 99 mg/dL Final  . Fructosamine 07/23/2017 379* 0 - 285 umol/L Final   Comment: Published reference interval for apparently healthy subjects between age 48 and 68 is 44 - 285 umol/L and in a poorly controlled diabetic population is 228 - 563 umol/L with a mean of 396 umol/L.     Other problems addressed:: See review of systems   Allergies as of 07/26/2017      Reactions   Cleocin [clindamycin Hcl] Diarrhea    Diarrhea    Lisinopril Other (See Comments)   Elevated potassium per pt report   Amoxicillin Diarrhea, Nausea Only   Has patient had a PCN reaction causing immediate rash, facial/tongue/throat swelling, SOB or lightheadedness with hypotension: No Has patient had a PCN reaction causing severe rash involving mucus membranes or skin necrosis: No Has patient had a PCN reaction that required hospitalization: No Has patient had a PCN reaction occurring within the last 10 years: No If all of the above answers are "NO", then may proceed with Cephalosporin use.   Bactrim [sulfamethoxazole-trimethoprim] Diarrhea, Nausea Only      Medication List        Accurate as of 07/26/17  2:25 PM. Always use your most recent med list.          albuterol 108 (90 Base) MCG/ACT inhaler Commonly known as:  PROVENTIL HFA;VENTOLIN HFA Inhale 2 puffs into the lungs every 6 (six) hours as needed for wheezing or shortness of breath. Reported on 04/15/2015   amLODipine 5 MG tablet Commonly known as:  NORVASC Take 1 tablet (5 mg total) by mouth daily.   aspirin 81 MG tablet Take 81 mg by mouth every evening.   calcitRIOL 0.5 MCG capsule Commonly known as:  ROCALTROL Take 0.5 mcg by mouth every evening.   CALCIUM GUMMIES 250-100-500 MG-MG-UNIT Chew Generic drug:  Calcium-Phosphorus-Vitamin D Chew 2 each by mouth every evening.   CINNAMON PO Take 1 tablet by mouth every evening.   CONTOUR NEXT TEST test strip Generic drug:  glucose blood USE AS INSTRUCTED TO CHECK BLOOD SUGAR 4 TIMES A DAY. DX E10.65   ergocalciferol 50000 units capsule Commonly known as:  VITAMIN D2 Take 1 capsule (50,000 Units total) by mouth once a week.   folic acid 1 MG tablet Commonly known as:  FOLVITE Take 1 mg by mouth every evening.   FREESTYLE LIBRE READER Devi 1 Device by Does not apply route as directed.   FREESTYLE LIBRE SENSOR SYSTEM Misc Use 1 sensor every 10 days   furosemide 40 MG tablet Commonly known as:   LASIX Take 40 mg by mouth daily as needed for fluid.   HUMALOG 100 UNIT/ML injection Generic drug:  insulin lispro CALL MD IF <70, IF 151-200 =2UNITS,201-250 =4 UNITS,251-300 =6 UNITS, 301-350 =8 UNITS,351-400 =10 U   levofloxacin 500 MG tablet Commonly known as:  LEVAQUIN TAKE 1 TABLET BY MOUTH EVERY DAY FOR 2 MORE WEEKS   loratadine 10 MG tablet Commonly known as:  CLARITIN Take 1 tablet (10 mg total) by mouth daily.   metroNIDAZOLE 500 MG tablet Commonly known as:  FLAGYL TAKE 1 TABLET BY  MOUTH 3 TIMES A DAY   norethindrone 5 MG tablet Commonly known as:  AYGESTIN Take by mouth.   ondansetron 4 MG disintegrating tablet Commonly known as:  ZOFRAN-ODT DISSOLVE 1 TABLET ON TONGUE EVERY 8 HOURS AS NEEDED FOR NAUSEA   Pen Needles 32G X 4 MM Misc 1 Device by Does not apply route 2 (two) times daily.   pravastatin 40 MG tablet Commonly known as:  PRAVACHOL TAKE 1 TABLET BY MOUTH EVERY DAY   pyridoxine 100 MG tablet Commonly known as:  B-6 Take 100 mg by mouth daily. Reported on 04/15/2015   vitamin C 500 MG tablet Commonly known as:  ASCORBIC ACID Take 500 mg by mouth daily.       Allergies:  Allergies  Allergen Reactions  . Cleocin [Clindamycin Hcl] Diarrhea    Diarrhea   . Lisinopril Other (See Comments)    Elevated potassium per pt report  . Amoxicillin Diarrhea and Nausea Only    Has patient had a PCN reaction causing immediate rash, facial/tongue/throat swelling, SOB or lightheadedness with hypotension: No Has patient had a PCN reaction causing severe rash involving mucus membranes or skin necrosis: No Has patient had a PCN reaction that required hospitalization: No Has patient had a PCN reaction occurring within the last 10 years: No If all of the above answers are "NO", then may proceed with Cephalosporin use.   . Bactrim [Sulfamethoxazole-Trimethoprim] Diarrhea and Nausea Only    Past Medical History:  Diagnosis Date  . Anemia   . Arthritis   .  Bronchitis   . C. difficile diarrhea 09/26/2014  . Cataracts, both eyes   . Cellulitis of right foot 06/02/2014  . CKD (chronic kidney disease) stage 3, GFR 30-59 ml/min (HCC)    sees Dr Justin Mend- Stage IV  . Diabetic ulcer of right foot ([place]) 06/02/2014  . DVT (deep venous thrombosis) ([place]) 10/2014   one in each one in leg- PICC line , one in right and left arm  . H/O seasonal allergies   . Heart murmur    told once when she was pregnant- no furter mention- was in notes 11/2015- had Echo 8 /4/17  . History of blood transfusion   . Hypercholesteremia   . Hypertension   . Left foot infection   . Neuropathy    feet  . Neuropathy in diabetes ([place])   . Peripheral vascular disease ([place])   . Sleep apnea    not able to use CPAP  . Staphylococcus aureus bacteremia 10/2014  . Type 1 diabetes ([place])    onset age 11    Past Surgical History:  Procedure Laterality Date  . AMPUTATION TOE Left 07/24/2015   5th toe and Hallux amputation  . CESAREAN SECTION    . EXOSTECTECTOMY TOE Left 04/11/2016   Procedure: EXOSTECTECTOMY CUBOID AND 5TH METATARSAL AND PLACEMENT OF ANTIBIOTIC BEADS;  Surgeon: Edrick Kins, DPM;  Location: [facility];  Service: Podiatry;  Laterality: Left;  . EYE SURGERY Bilateral    [facility]  . FEMORAL-POPLITEAL BYPASS GRAFT Left 05/03/2015  . FOOT AMPUTATION THROUGH METATARSAL Right 2016  . hemrrhoidectomy    . I&D EXTREMITY Right 06/10/2014   Procedure: IRRIGATION AND DEBRIDEMENT Right Foot;  Surgeon: Newt Minion, MD;  Location: [facility];  Service: Orthopedics;  Laterality: Right;  . INCISION AND DRAINAGE Left 04/11/2016   Procedure: INCISION AND DRAINAGE A DEEP COMPLICATED WOUND OF LEFT FOOT;  Surgeon: Edrick Kins, DPM;  Location: [facility];  Service: Podiatry;  Laterality: Left;  . IRRIGATION AND DEBRIDEMENT ABSCESS Left 05/06/2017   Procedure: IRRIGATION AND DEBRIDEMENT ABSCESS LEFT FOOT;  Surgeon: Edrick Kins, DPM;  Location: [facility];  Service: Podiatry;  Laterality: Left;  . PERIPHERALLY  INSERTED CENTRAL CATHETER INSERTION    . SKIN GRAFT Right 06/15/2014  . SKIN GRAFT Left 06/2015   foot   . SKIN SPLIT GRAFT Right 06/15/2014   Procedure: SPLIT THICKNESS SKIN GRAFT RIGHT FOOT;  Surgeon: Newt Minion, MD;  Location: [facility];  Service: Orthopedics;  Laterality: Right;  . TRANSMETATARSAL AMPUTATION Right   . TRANSMETATARSAL AMPUTATION Right 09/11/2014    Family History  Problem Relation Age of Onset  . Hyperlipidemia Mother   . Dementia Mother     Social History:  reports that she quit smoking about 9 years ago. She quit after 15.00 years of use. She has never used smokeless tobacco. She reports that she drinks alcohol. She reports that she does not use drugs.  Review of Systems:   Hypertension/CKD:     Hypertension  Treated with amlodipine 5 mg, followed by nephrologist  BP Readings from Last 3 Encounters:  07/26/17 100/64  07/03/17 (!) 148/88  06/03/17 (!) 152/83    She is taking calcitriol from nephrologist  Also has vitamin D deficiency   Lab Results  Component Value Date   CALCIUM 7.9 (L) 05/14/2017   PHOS 3.5 08/18/2014     Lab Results  Component Value Date   CREATININE 2.23 (H) 05/14/2017   BUN 37 (H) 05/14/2017   NA 134 (L) 05/14/2017   K 4.4 05/14/2017   CL 102 05/14/2017   CO2 21 05/14/2017     Lipids: Has been  treated with pravastatin Triglycerides are high LDL as follows   Lab Results  Component Value Date   CHOL 164 01/11/2017   HDL 25.50 (L) 01/11/2017   LDLCALC 78 05/02/2016   LDLDIRECT 93.0 01/11/2017   TRIG 282.0 (H) 01/11/2017   CHOLHDL 6 01/11/2017      Chronic anemia, followed by nephrologist and PCP:  Lab Results  Component Value Date   WBC 8.1 05/10/2017   HGB 8.2 (L) 05/10/2017   HCT 25.3 (L) 05/10/2017   MCV 82.4 05/10/2017   PLT 603 (H) 05/10/2017    She has a Charcot foot And has had amputation of Left fifth toe which was infected She is going to the podiatrist with  continued nonhealing  ulcers Currently getting hyperbaric treatment and will be doing so for another 2-1/2 weeks  She wants a muscle relaxant for cramps that she has occasionally, does not know the name of the medication and she thinks her PCP refuses to prescribe this   Examination:   BP 100/64 (BP Location: Left Arm, Patient Position: Sitting, Cuff Size: Normal)   Pulse 81   Ht 5\' 7"  (1.702 m)   Wt 122 lb 9.6 oz (55.6 kg)   SpO2 96%   BMI 19.20 kg/m   Body mass index is 19.2 kg/m.      ASSESSMENT/ PLAN:   Diabetes type 1 with poor control and multiple complications    Her diabetes is persistently poorly controlled mostly from noncompliance with all measures of self-care  See history of present illness for detailed discussion of current diabetes management, blood sugar patterns and problems identified   Blood sugars are averaging 352 No recent A1c available  See history of present illness for details of her blood sugar patterns, current management and problems identified  She  is not bolusing and managing her diabetes as discussed her visits She is also suspending her pump for long hours almost daily and not resuming it She is also suspending the pump because of not being able to take it in for her hyperbaric treatments    DIABETIC nephropathy with nephrotic syndrome: followed by nephrologist    PLAN:     She was trained on how to do the [employer] today  Sensitivity will be changed to 1: 70 between 10 PM-8 AM  Will need to bolus consistently for all carbohydrate intake at all times of the day  REDUCE basal rate down to 0.4 at 8 AM until 12 noon  Increase basal rate at 12 noon up to 0.55  Increase basal rate to 0.75 starting at 4 PM  She will not suspend the pump unless she is going for hyperbaric treatment or she is getting a low blood sugar that she is not able to treat  Needs to check blood sugars frequently throughout the day when she starts using the [facility]  and bring to [place] for reading  Patient Instructions  Do not suspend pump at nite  Bolus for all Carbs  Compare Libre to Contour       Counseling time on subjects discussed in assessment and plan sections is over 50% of today's 25 minute visit  Elayne Snare 07/26/2017, 2:25 PM

## 2017-07-29 ENCOUNTER — Encounter (HOSPITAL_COMMUNITY)
Admission: RE | Admit: 2017-07-29 | Discharge: 2017-07-29 | Disposition: A | Discharge status: Home / Self Care | Payer: Medicare Other | Source: Ambulatory Visit | Attending: Nephrology | Admitting: Nephrology

## 2017-07-29 ENCOUNTER — Encounter (HOSPITAL_COMMUNITY): Payer: Self-pay

## 2017-07-29 DIAGNOSIS — D631 Anemia in chronic kidney disease: Secondary | ICD-10-CM | POA: Diagnosis present

## 2017-07-29 DIAGNOSIS — N184 Chronic kidney disease, stage 4 (severe): Secondary | ICD-10-CM | POA: Diagnosis present

## 2017-07-29 DIAGNOSIS — E1052 Type 1 diabetes mellitus with diabetic peripheral angiopathy with gangrene: Secondary | ICD-10-CM | POA: Diagnosis not present

## 2017-07-29 LAB — FERRITIN: Ferritin: 11 ng/mL (ref 11–307)

## 2017-07-29 LAB — GLUCOSE, CAPILLARY
GLUCOSE-CAPILLARY: 186 mg/dL — AB (ref 65–99)
Glucose-Capillary: 166 mg/dL — ABNORMAL HIGH (ref 65–99)

## 2017-07-29 LAB — IRON AND TIBC
IRON: 28 ug/dL (ref 28–170)
Saturation Ratios: 9 % — ABNORMAL LOW (ref 10.4–31.8)
TIBC: 300 ug/dL (ref 250–450)
UIBC: 272 ug/dL

## 2017-07-29 LAB — HEMOGLOBIN: HEMOGLOBIN: 10.4 g/dL — AB (ref 12.0–15.0)

## 2017-07-29 MED ORDER — EPOETIN ALFA 10000 UNIT/ML IJ SOLN
10000.0000 [IU] | INTRAMUSCULAR | Status: DC
  Administered 2017-07-29: 10000 [IU] via SUBCUTANEOUS
  Filled 2017-07-29: qty 1

## 2017-07-29 NOTE — Discharge Instructions (Signed)
Epoetin Alfa injection / procrit What is this medicine? EPOETIN ALFA (e POE e tin AL fa) helps your body make more red blood cells. This medicine is used to treat anemia caused by chronic kidney failure, cancer chemotherapy, or HIV-therapy. It may also be used before surgery if you have anemia. This medicine may be used for other purposes; ask your health care provider or pharmacist if you have questions. COMMON BRAND NAME(S): Epogen, Procrit What should I tell my health care provider before I take this medicine? They need to know if you have any of these conditions: -blood clotting disorders -cancer patient not on chemotherapy -cystic fibrosis -heart disease, such as angina or heart failure -hemoglobin level of 12 g/dL or greater -high blood pressure -low levels of folate, iron, or vitamin B12 -seizures -an unusual or allergic reaction to erythropoietin, albumin, benzyl alcohol, hamster proteins, other medicines, foods, dyes, or preservatives -pregnant or trying to get pregnant -breast-feeding How should I use this medicine? This medicine is for injection into a vein or under the skin. It is usually given by a health care professional in a hospital or clinic setting. If you get this medicine at home, you will be taught how to prepare and give this medicine. Use exactly as directed. Take your medicine at regular intervals. Do not take your medicine more often than directed. It is important that you put your used needles and syringes in a special sharps container. Do not put them in a trash can. If you do not have a sharps container, call your pharmacist or healthcare provider to get one. A special MedGuide will be given to you by the pharmacist with each prescription and refill. Be sure to read this information carefully each time. Talk to your pediatrician regarding the use of this medicine in children. While this drug may be prescribed for selected conditions, precautions do  apply. Overdosage: If you think you have taken too much of this medicine contact a poison control center or emergency room at once. NOTE: This medicine is only for you. Do not share this medicine with others. What if I miss a dose? If you miss a dose, take it as soon as you can. If it is almost time for your next dose, take only that dose. Do not take double or extra doses. What may interact with this medicine? Do not take this medicine with any of the following medications: -darbepoetin alfa This list may not describe all possible interactions. Give your health care provider a list of all the medicines, herbs, non-prescription drugs, or dietary supplements you use. Also tell them if you smoke, drink alcohol, or use illegal drugs. Some items may interact with your medicine. What should I watch for while using this medicine? Your condition will be monitored carefully while you are receiving this medicine. You may need blood work done while you are taking this medicine. What side effects may I notice from receiving this medicine? Side effects that you should report to your doctor or health care professional as soon as possible: -allergic reactions like skin rash, itching or hives, swelling of the face, lips, or tongue -breathing problems -changes in vision -chest pain -confusion, trouble speaking or understanding -feeling faint or lightheaded, falls -high blood pressure -muscle aches or pains -pain, swelling, warmth in the leg -rapid weight gain -severe headaches -sudden numbness or weakness of the face, arm or leg -trouble walking, dizziness, loss of balance or coordination -seizures (convulsions) -swelling of the ankles, feet, hands -unusually weak  or tired Side effects that usually do not require medical attention (report to your doctor or health care professional if they continue or are bothersome): -diarrhea -fever, chills (flu-like symptoms) -headaches -nausea,  vomiting -redness, stinging, or swelling at site where injected This list may not describe all possible side effects. Call your doctor for medical advice about side effects. You may report side effects to FDA at 1-800-FDA-1088. Where should I keep my medicine? Keep out of the reach of children. Store in a refrigerator between 2 and 8 degrees C (36 and 46 degrees F). Do not freeze or shake. Throw away any unused portion if using a single-dose vial. Multi-dose vials can be kept in the refrigerator for up to 21 days after the initial dose. Throw away unused medicine. NOTE: This sheet is a summary. It may not cover all possible information. If you have questions about this medicine, talk to your doctor, pharmacist, or health care provider.  2018 Elsevier/Gold Standard (2015-11-07 19:42:31)

## 2017-07-30 DIAGNOSIS — E1052 Type 1 diabetes mellitus with diabetic peripheral angiopathy with gangrene: Secondary | ICD-10-CM | POA: Diagnosis not present

## 2017-07-30 LAB — GLUCOSE, CAPILLARY
GLUCOSE-CAPILLARY: 186 mg/dL — AB (ref 65–99)
GLUCOSE-CAPILLARY: 194 mg/dL — AB (ref 65–99)

## 2017-07-31 ENCOUNTER — Other Ambulatory Visit: Payer: Self-pay | Admitting: Endocrinology

## 2017-07-31 ENCOUNTER — Encounter (HOSPITAL_BASED_OUTPATIENT_CLINIC_OR_DEPARTMENT_OTHER): Payer: Medicare Other | Attending: Physician Assistant

## 2017-07-31 DIAGNOSIS — G4730 Sleep apnea, unspecified: Secondary | ICD-10-CM | POA: Insufficient documentation

## 2017-07-31 DIAGNOSIS — E1036 Type 1 diabetes mellitus with diabetic cataract: Secondary | ICD-10-CM | POA: Diagnosis not present

## 2017-07-31 DIAGNOSIS — Z86718 Personal history of other venous thrombosis and embolism: Secondary | ICD-10-CM | POA: Diagnosis not present

## 2017-07-31 DIAGNOSIS — L97425 Non-pressure chronic ulcer of left heel and midfoot with muscle involvement without evidence of necrosis: Secondary | ICD-10-CM | POA: Diagnosis not present

## 2017-07-31 DIAGNOSIS — M86172 Other acute osteomyelitis, left ankle and foot: Secondary | ICD-10-CM | POA: Insufficient documentation

## 2017-07-31 DIAGNOSIS — I96 Gangrene, not elsewhere classified: Secondary | ICD-10-CM | POA: Diagnosis not present

## 2017-07-31 DIAGNOSIS — I10 Essential (primary) hypertension: Secondary | ICD-10-CM | POA: Diagnosis not present

## 2017-07-31 DIAGNOSIS — E10621 Type 1 diabetes mellitus with foot ulcer: Secondary | ICD-10-CM | POA: Diagnosis present

## 2017-07-31 DIAGNOSIS — Z89431 Acquired absence of right foot: Secondary | ICD-10-CM | POA: Diagnosis not present

## 2017-07-31 DIAGNOSIS — E1069 Type 1 diabetes mellitus with other specified complication: Secondary | ICD-10-CM | POA: Insufficient documentation

## 2017-07-31 DIAGNOSIS — L97412 Non-pressure chronic ulcer of right heel and midfoot with fat layer exposed: Secondary | ICD-10-CM | POA: Diagnosis not present

## 2017-07-31 DIAGNOSIS — E1040 Type 1 diabetes mellitus with diabetic neuropathy, unspecified: Secondary | ICD-10-CM | POA: Insufficient documentation

## 2017-07-31 DIAGNOSIS — E1052 Type 1 diabetes mellitus with diabetic peripheral angiopathy with gangrene: Secondary | ICD-10-CM | POA: Insufficient documentation

## 2017-07-31 LAB — GLUCOSE, CAPILLARY
GLUCOSE-CAPILLARY: 245 mg/dL — AB (ref 65–99)
Glucose-Capillary: 216 mg/dL — ABNORMAL HIGH (ref 65–99)

## 2017-08-01 DIAGNOSIS — E10621 Type 1 diabetes mellitus with foot ulcer: Secondary | ICD-10-CM | POA: Diagnosis not present

## 2017-08-01 LAB — GLUCOSE, CAPILLARY
GLUCOSE-CAPILLARY: 274 mg/dL — AB (ref 65–99)
Glucose-Capillary: 214 mg/dL — ABNORMAL HIGH (ref 65–99)

## 2017-08-02 DIAGNOSIS — E10621 Type 1 diabetes mellitus with foot ulcer: Secondary | ICD-10-CM | POA: Diagnosis not present

## 2017-08-02 LAB — GLUCOSE, CAPILLARY
Glucose-Capillary: 213 mg/dL — ABNORMAL HIGH (ref 65–99)
Glucose-Capillary: 249 mg/dL — ABNORMAL HIGH (ref 65–99)

## 2017-08-05 DIAGNOSIS — E10621 Type 1 diabetes mellitus with foot ulcer: Secondary | ICD-10-CM | POA: Diagnosis not present

## 2017-08-05 LAB — GLUCOSE, CAPILLARY
GLUCOSE-CAPILLARY: 332 mg/dL — AB (ref 65–99)
Glucose-Capillary: 306 mg/dL — ABNORMAL HIGH (ref 65–99)
Glucose-Capillary: 185 mg/dL — ABNORMAL HIGH (ref 65–99)

## 2017-08-06 ENCOUNTER — Other Ambulatory Visit (HOSPITAL_BASED_OUTPATIENT_CLINIC_OR_DEPARTMENT_OTHER): Payer: Self-pay | Admitting: Internal Medicine

## 2017-08-06 ENCOUNTER — Ambulatory Visit (HOSPITAL_COMMUNITY)
Admission: RE | Admit: 2017-08-06 | Discharge: 2017-08-06 | Disposition: A | Discharge status: Home / Self Care | Payer: Medicare Other | Source: Ambulatory Visit | Attending: Internal Medicine | Admitting: Internal Medicine

## 2017-08-06 DIAGNOSIS — R937 Abnormal findings on diagnostic imaging of other parts of musculoskeletal system: Secondary | ICD-10-CM | POA: Diagnosis not present

## 2017-08-06 DIAGNOSIS — E10621 Type 1 diabetes mellitus with foot ulcer: Secondary | ICD-10-CM | POA: Diagnosis not present

## 2017-08-06 DIAGNOSIS — M79671 Pain in right foot: Secondary | ICD-10-CM | POA: Insufficient documentation

## 2017-08-06 LAB — GLUCOSE, CAPILLARY
Glucose-Capillary: 219 mg/dL — ABNORMAL HIGH (ref 65–99)
Glucose-Capillary: 238 mg/dL — ABNORMAL HIGH (ref 65–99)

## 2017-08-06 IMAGING — DX DG FOOT COMPLETE 3+V*L*
3 series · 3 of 3 positions shown · non-contrast
Comparison: 10/12/2014

CLINICAL DATA: Foot pain, no known injury, initial encounter

EXAM:
LEFT FOOT - COMPLETE 3+ VIEW

[foot ap]
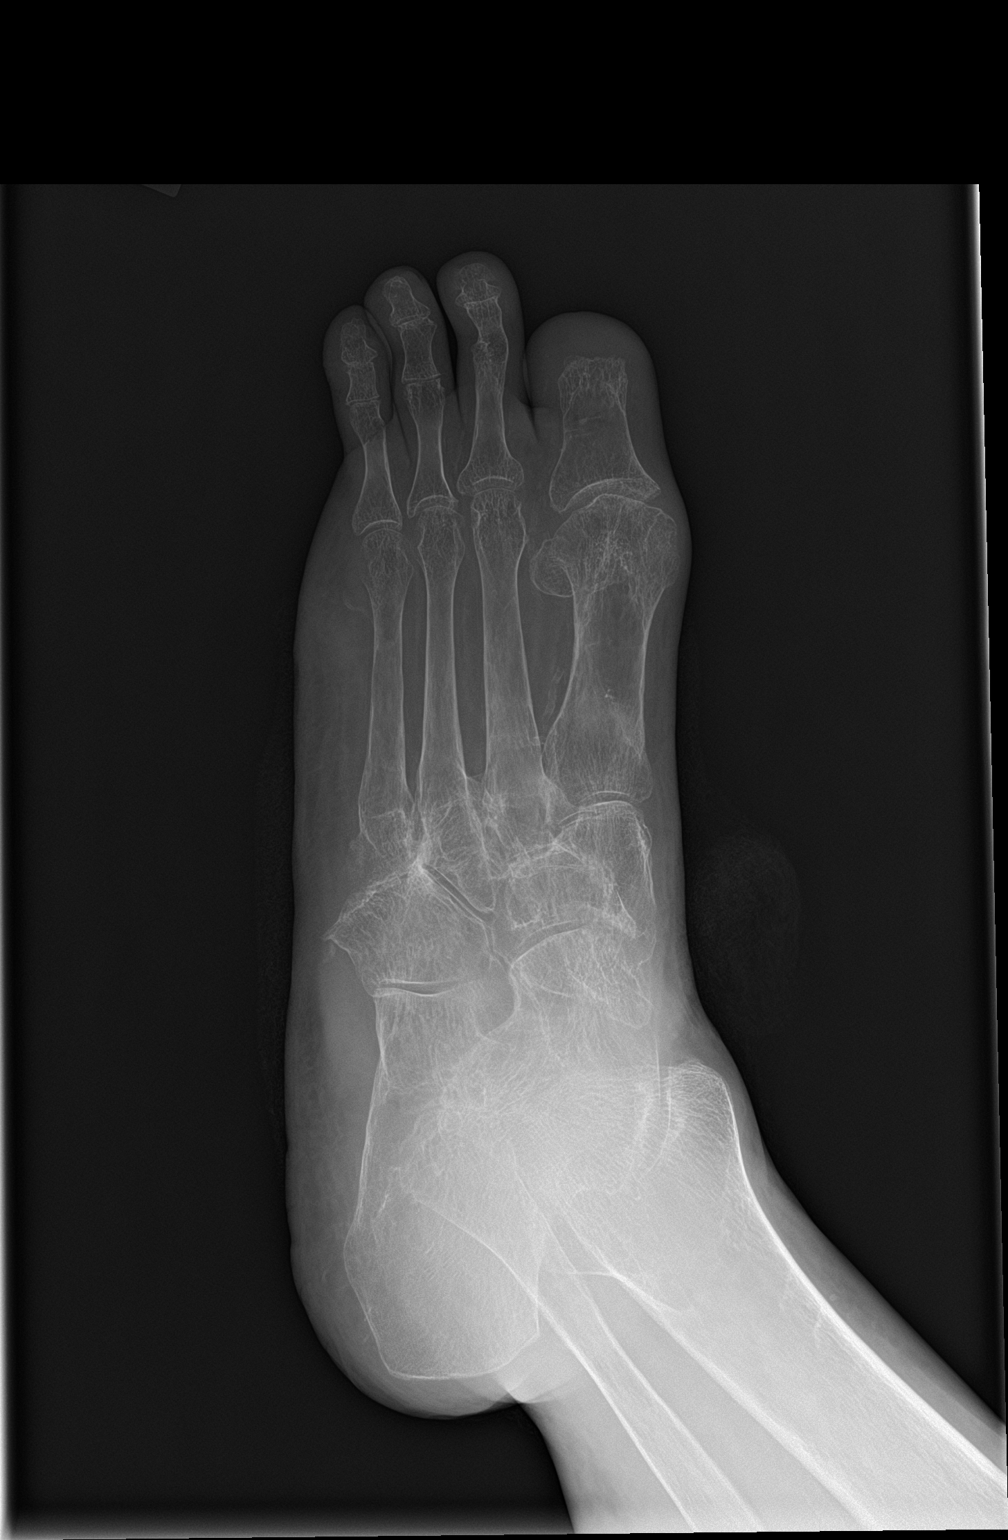

[foot obl]
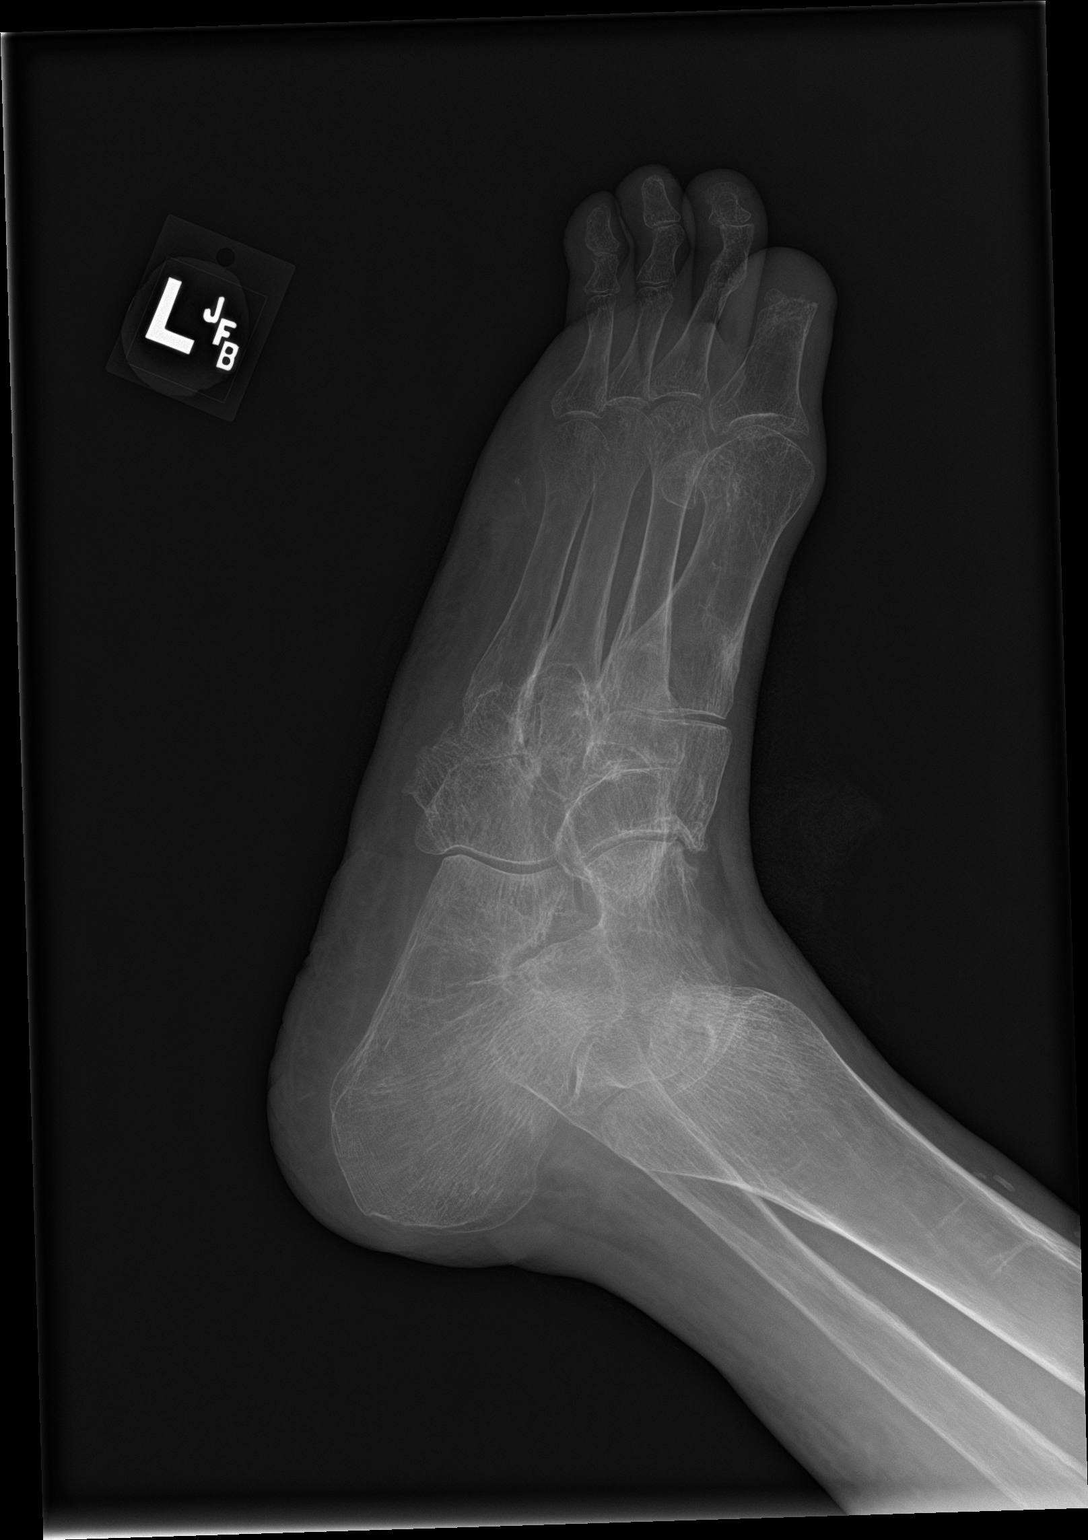

[foot lat]
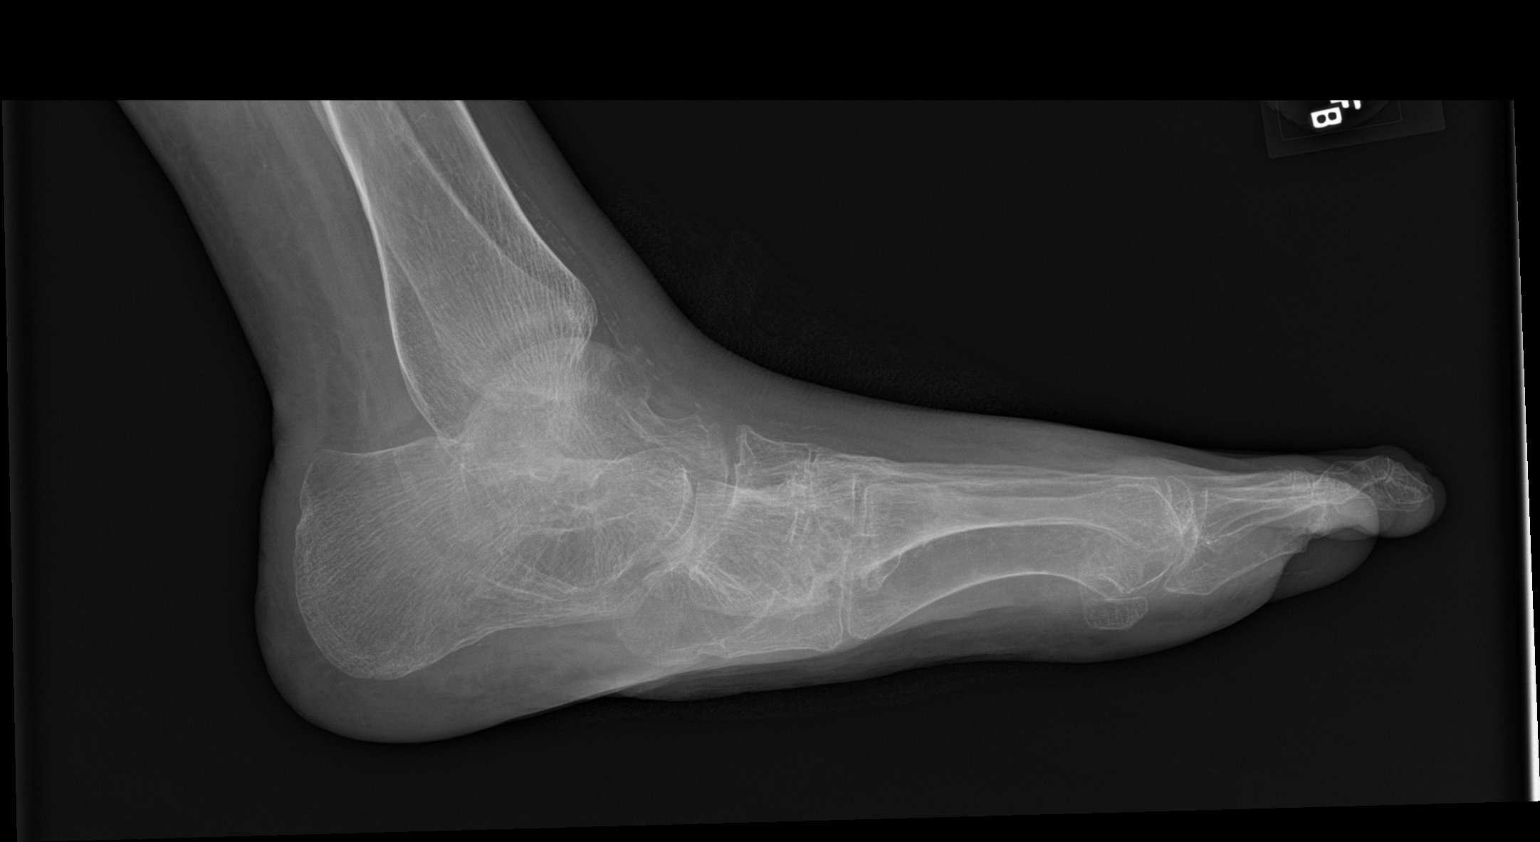

[3 of 3 positions shown; findings below may reference images not displayed]

FINDINGS: There is been interval resection of the fifth toe including the
fifth metatarsal. The distal phalanx of the first toe has been
removed as well. Mild osteopenia is seen. No acute fracture or
dislocation is noted. Mild soft tissue swelling is seen.
IMPRESSION: Postsurgical changes.

Soft tissue swelling without acute bony abnormality.

## 2017-08-07 ENCOUNTER — Other Ambulatory Visit: Payer: Self-pay | Admitting: Endocrinology

## 2017-08-07 DIAGNOSIS — E10621 Type 1 diabetes mellitus with foot ulcer: Secondary | ICD-10-CM | POA: Diagnosis not present

## 2017-08-07 LAB — GLUCOSE, CAPILLARY
GLUCOSE-CAPILLARY: 245 mg/dL — AB (ref 65–99)
GLUCOSE-CAPILLARY: 253 mg/dL — AB (ref 65–99)

## 2017-08-08 ENCOUNTER — Ambulatory Visit (INDEPENDENT_AMBULATORY_CARE_PROVIDER_SITE_OTHER): Payer: Medicare Other | Admitting: Internal Medicine

## 2017-08-08 DIAGNOSIS — E11628 Type 2 diabetes mellitus with other skin complications: Secondary | ICD-10-CM

## 2017-08-08 DIAGNOSIS — L089 Local infection of the skin and subcutaneous tissue, unspecified: Secondary | ICD-10-CM

## 2017-08-08 DIAGNOSIS — I70235 Atherosclerosis of native arteries of right leg with ulceration of other part of foot: Secondary | ICD-10-CM

## 2017-08-08 DIAGNOSIS — E10621 Type 1 diabetes mellitus with foot ulcer: Secondary | ICD-10-CM | POA: Diagnosis not present

## 2017-08-08 LAB — GLUCOSE, CAPILLARY: GLUCOSE-CAPILLARY: 234 mg/dL — AB (ref 65–99)

## 2017-08-08 NOTE — Assessment & Plan Note (Signed)
She has now completed 3 months of antibiotic therapy.  She has no exposed bone and the wound looks healthy.  I will have her stop antibiotic therapy now and follow-up in 4 weeks.  I would like her to follow-up with [name] to reconsider skin grafting.

## 2017-08-08 NOTE — Progress Notes (Signed)
[facility]  Patient Active Problem List   Diagnosis Date Noted  . Diabetic infection of left foot ([place]) 10/13/2014    Priority: High  . Status post transmetatarsal amputation of right foot ([place]) 09/26/2014    Priority: High  . Foot ulcer due to secondary DM ([place]) 05/09/2011    Priority: High  . Gangrene of left foot ([place]) 05/08/2017  . Osteomyelitis of left foot ([place]) 05/08/2017  . Diabetic foot infection ([place])   . Diabetic foot ulcer ([place]) 05/05/2017  . Cellulitis 05/04/2017  . Diabetes mellitus type 1 with peripheral artery disease ([place]) 01/16/2017  . Heart murmur 11/03/2015  . Protein-calorie malnutrition, moderate ([place]) 10/18/2014  . History of DVT (deep vein thrombosis)   . Chronic anemia 10/13/2014  . DM type 1 causing renal disease ([place]) 06/13/2013  . CKD (chronic kidney disease), stage III ([place]) 06/12/2013  . Charcot foot due to diabetes mellitus ([place]) 05/09/2011  . Type 1 diabetes mellitus with neurological manifestations, uncontrolled ([place])   . Hypertension   . Hypercholesteremia     Patient's Medications  New Prescriptions   No medications on file  Previous Medications   ALBUTEROL (PROVENTIL HFA;VENTOLIN HFA) 108 (90 BASE) MCG/ACT INHALER    Inhale 2 puffs into the lungs every 6 (six) hours as needed for wheezing or shortness of breath. Reported on 04/15/2015   AMLODIPINE (NORVASC) 5 MG TABLET    Take 1 tablet (5 mg total) by mouth daily.   ASPIRIN 81 MG TABLET    Take 81 mg by mouth every evening.    CALCITRIOL (ROCALTROL) 0.5 MCG CAPSULE    Take 0.5 mcg by mouth every evening.    CALCIUM-PHOSPHORUS-VITAMIN D (CALCIUM GUMMIES) [id number] MG-MG-UNIT CHEW    Chew 2 each by mouth every evening.    CINNAMON PO    Take 1 tablet by mouth every evening.    CONTINUOUS BLOOD GLUC RECEIVER (FREESTYLE LIBRE READER) DEVI    1 Device by Does not apply route as directed.   CONTINUOUS BLOOD GLUC SENSOR (FREESTYLE LIBRE SENSOR SYSTEM) MISC     Use 1 sensor every 10 days   CONTOUR NEXT TEST TEST STRIP    USE AS INSTRUCTED TO CHECK BLOOD SUGAR 4 TIMES A DAY. DX G83.66   FOLIC ACID (FOLVITE) 1 MG TABLET    Take 1 mg by mouth every evening.    FUROSEMIDE (LASIX) 40 MG TABLET    Take 40 mg by mouth daily as needed for fluid.    HUMALOG 100 UNIT/ML INJECTION    CALL MD IF <70, IF 151-200 =2UNITS,201-250 =4 UNITS,251-300 =6 UNITS, 301-350 =8 UNITS,351-400 =10 U   INSULIN PEN NEEDLE (PEN NEEDLES) 32G X 4 MM MISC    1 Device by Does not apply route 2 (two) times daily.   LORATADINE (CLARITIN) 10 MG TABLET    Take 1 tablet (10 mg total) by mouth daily.   NORETHINDRONE (AYGESTIN) 5 MG TABLET    Take by mouth.   ONDANSETRON (ZOFRAN-ODT) 4 MG DISINTEGRATING TABLET    DISSOLVE 1 TABLET ON TONGUE EVERY 8 HOURS AS NEEDED FOR NAUSEA   PRAVASTATIN (PRAVACHOL) 40 MG TABLET    TAKE 1 TABLET BY MOUTH EVERY DAY   PYRIDOXINE (B-6) 100 MG TABLET    Take 100 mg by mouth daily. Reported on 04/15/2015   VITAMIN C (ASCORBIC ACID) 500 MG TABLET    Take 500 mg by mouth daily.   VITAMIN D,  ERGOCALCIFEROL, (DRISDOL) 50000 UNITS CAPS CAPSULE    TAKE ONE CAPSULE BY MOUTH ONE TIME PER WEEK  Modified Medications   No medications on file  Discontinued Medications   LEVOFLOXACIN (LEVAQUIN) 500 MG TABLET    TAKE 1 TABLET BY MOUTH EVERY DAY FOR 2 MORE WEEKS   METRONIDAZOLE (FLAGYL) 500 MG TABLET    TAKE 1 TABLET BY MOUTH 3 TIMES A DAY    Subjective: [name] is in for her routine follow-up visit.  She continues to take levofloxacin and metronidazole for her left foot infection she has now been on treatment for about 90 days.  She denies any problems tolerating her antibiotics.  She completed 30 days of hyperbaric oxygen therapy.  She is pleased with the progress she has made.  She did not agreed to BKA when she was in the hospital.  She had discussed the possibility of a transmetatarsal amputation with her podiatrist, [name] but given recent improvement the plan  had been changed to debridement and skin grafting.  That had been scheduled for 06/27/2017 but she was not given clearance by her primary care provider so that surgery was canceled.  She has been working with Dr. Dossie Der at the wound center.  She has been back on hyperbaric oxygen therapy for the last 2 weeks.  She has now completed a total of 6 weeks of therapy.  Review of Systems: Review of Systems  Constitutional: Negative for chills, diaphoresis and fever.  Gastrointestinal: Negative for abdominal pain, diarrhea, nausea and vomiting.  Musculoskeletal: Positive for joint pain.  Neurological: Positive for sensory change.    Past Medical History:  Diagnosis Date  . Anemia   . Arthritis   . Bronchitis   . C. difficile diarrhea 09/26/2014  . Cataracts, both eyes   . Cellulitis of right foot 06/02/2014  . CKD (chronic kidney disease) stage 3, GFR 30-59 ml/min (HCC)    sees Dr Justin Mend- Stage IV  . Diabetic ulcer of right foot ([place]) 06/02/2014  . DVT (deep venous thrombosis) ([place]) 10/2014   one in each one in leg- PICC line , one in right and left arm  . H/O seasonal allergies   . Heart murmur    told once when she was pregnant- no furter mention- was in notes 11/2015- had Echo 8 /4/17  . History of blood transfusion   . Hypercholesteremia   . Hypertension   . Left foot infection   . Neuropathy    feet  . Neuropathy in diabetes ([place])   . Peripheral vascular disease ([place])   . Sleep apnea    not able to use CPAP  . Staphylococcus aureus bacteremia 10/2014  . Type 1 diabetes [facility])    onset age 55    Social History   Tobacco Use  . Smoking status: Former Smoker    Years: 15.00    Last attempt to quit: 05/16/2008    Years since quitting: 9.2  . Smokeless tobacco: Never Used  Substance Use Topics  . Alcohol use: Yes    Comment: Occasional  . Drug use: No    Family History  Problem Relation Age of Onset  . Hyperlipidemia Mother   . Dementia Mother     Allergies  Allergen  Reactions  . Cleocin [Clindamycin Hcl] Diarrhea    Diarrhea   . Lisinopril Other (See Comments)    Elevated potassium per pt report  . Amoxicillin Diarrhea and Nausea Only    Has patient had a PCN  reaction causing immediate rash, facial/tongue/throat swelling, SOB or lightheadedness with hypotension: No Has patient had a PCN reaction causing severe rash involving mucus membranes or skin necrosis: No Has patient had a PCN reaction that required hospitalization: No Has patient had a PCN reaction occurring within the last 10 years: No If all of the above answers are "NO", then may proceed with Cephalosporin use.   . Bactrim [Sulfamethoxazole-Trimethoprim] Diarrhea and Nausea Only    Objective: Vitals:   08/08/17 0839  BP: 126/79  Pulse: 87  Temp: (!) 97.5 F (36.4 C)  SpO2: 99%  Weight: 125 lb 12.8 oz (57.1 kg)  Height: 5\' 7"  (1.702 m)   Body mass index is 19.7 kg/m.  Physical Exam  Constitutional: She is oriented to person, place, and time.  She is talkative and in good spirits as usual.  Musculoskeletal:  Right foot: The superficial ulcer under her first metatarsal stump is almost completely healed.  There is no odor or drainage.  Left foot her fifth toe and ray is surgically absent.  Wound has shown significant improvement but is still open from the midfoot distally.  There is some drainage on the dressing.  There is no odor or surrounding cellulitis.  There is no exposed bone.  Neurological: She is alert and oriented to person, place, and time.  Skin: No rash noted.  Psychiatric: Mood and affect normal.    Lab Results    Problem List Items Addressed This Visit      High   Diabetic infection of left foot [facility])    She has now completed 3 months of antibiotic therapy.  She has no exposed bone and the wound looks healthy.  I will have her stop antibiotic therapy now and follow-up in 4 weeks.  I would like her to follow-up with [name] to reconsider skin grafting.            Michel Bickers, MD [facility] for [facility] [phone] pager   [phone] cell 08/08/2017, 9:08 AM

## 2017-08-09 DIAGNOSIS — E10621 Type 1 diabetes mellitus with foot ulcer: Secondary | ICD-10-CM | POA: Diagnosis not present

## 2017-08-09 LAB — GLUCOSE, CAPILLARY
GLUCOSE-CAPILLARY: 280 mg/dL — AB (ref 65–99)
GLUCOSE-CAPILLARY: 111 mg/dL — AB (ref 65–99)
Glucose-Capillary: 125 mg/dL — ABNORMAL HIGH (ref 65–99)
Glucose-Capillary: 150 mg/dL — ABNORMAL HIGH (ref 65–99)
Glucose-Capillary: 282 mg/dL — ABNORMAL HIGH (ref 65–99)
Glucose-Capillary: 282 mg/dL — ABNORMAL HIGH (ref 65–99)

## 2017-08-12 ENCOUNTER — Other Ambulatory Visit: Payer: Self-pay

## 2017-08-12 ENCOUNTER — Telehealth: Payer: Self-pay | Admitting: Emergency Medicine

## 2017-08-12 ENCOUNTER — Encounter (HOSPITAL_COMMUNITY): Payer: Medicare Other

## 2017-08-12 DIAGNOSIS — E10621 Type 1 diabetes mellitus with foot ulcer: Secondary | ICD-10-CM | POA: Diagnosis not present

## 2017-08-12 LAB — GLUCOSE, CAPILLARY
GLUCOSE-CAPILLARY: 130 mg/dL — AB (ref 65–99)
GLUCOSE-CAPILLARY: 174 mg/dL — AB (ref 65–99)

## 2017-08-12 MED ORDER — GLUCOSE BLOOD VI STRP: ORAL_STRIP | 3 refills | Status: DC

## 2017-08-12 NOTE — Telephone Encounter (Signed)
Called patient back and sent new rx to [facility] on [address] for test strips.

## 2017-08-12 NOTE — Telephone Encounter (Signed)
Patient called and stated she is still having problems getting her test strips from the pharmacy and would like you to give her a call back thanks.

## 2017-08-13 ENCOUNTER — Other Ambulatory Visit: Payer: Self-pay

## 2017-08-13 DIAGNOSIS — E10621 Type 1 diabetes mellitus with foot ulcer: Secondary | ICD-10-CM | POA: Diagnosis not present

## 2017-08-13 LAB — GLUCOSE, CAPILLARY
GLUCOSE-CAPILLARY: 503 mg/dL — AB (ref 65–99)
GLUCOSE-CAPILLARY: 479 mg/dL — AB (ref 65–99)

## 2017-08-13 MED ORDER — CALCITRIOL 0.5 MCG PO CAPS: ORAL_CAPSULE | ORAL | 2 refills | Status: AC

## 2017-08-14 DIAGNOSIS — E10621 Type 1 diabetes mellitus with foot ulcer: Secondary | ICD-10-CM | POA: Diagnosis not present

## 2017-08-14 LAB — GLUCOSE, CAPILLARY
GLUCOSE-CAPILLARY: 164 mg/dL — AB (ref 65–99)
Glucose-Capillary: 226 mg/dL — ABNORMAL HIGH (ref 65–99)

## 2017-08-15 DIAGNOSIS — E10621 Type 1 diabetes mellitus with foot ulcer: Secondary | ICD-10-CM | POA: Diagnosis not present

## 2017-08-15 LAB — GLUCOSE, CAPILLARY
GLUCOSE-CAPILLARY: 250 mg/dL — AB (ref 65–99)
Glucose-Capillary: 198 mg/dL — ABNORMAL HIGH (ref 65–99)

## 2017-08-19 DIAGNOSIS — E10621 Type 1 diabetes mellitus with foot ulcer: Secondary | ICD-10-CM | POA: Diagnosis not present

## 2017-08-19 LAB — GLUCOSE, CAPILLARY
Glucose-Capillary: 442 mg/dL — ABNORMAL HIGH (ref 65–99)
Glucose-Capillary: 275 mg/dL — ABNORMAL HIGH (ref 65–99)

## 2017-08-20 DIAGNOSIS — E10621 Type 1 diabetes mellitus with foot ulcer: Secondary | ICD-10-CM | POA: Diagnosis not present

## 2017-08-20 LAB — GLUCOSE, CAPILLARY
GLUCOSE-CAPILLARY: 233 mg/dL — AB (ref 65–99)
Glucose-Capillary: 229 mg/dL — ABNORMAL HIGH (ref 65–99)

## 2017-08-27 ENCOUNTER — Encounter (HOSPITAL_COMMUNITY)
Admission: RE | Admit: 2017-08-27 | Discharge: 2017-08-27 | Disposition: A | Discharge status: Home / Self Care | Payer: Medicare Other | Source: Ambulatory Visit | Attending: Nephrology | Admitting: Nephrology

## 2017-08-27 ENCOUNTER — Encounter (HOSPITAL_COMMUNITY): Payer: Self-pay

## 2017-08-27 DIAGNOSIS — D631 Anemia in chronic kidney disease: Secondary | ICD-10-CM | POA: Insufficient documentation

## 2017-08-27 DIAGNOSIS — N184 Chronic kidney disease, stage 4 (severe): Secondary | ICD-10-CM | POA: Diagnosis present

## 2017-08-27 DIAGNOSIS — E10621 Type 1 diabetes mellitus with foot ulcer: Secondary | ICD-10-CM | POA: Diagnosis not present

## 2017-08-27 LAB — IRON AND TIBC
Iron: 16 ug/dL — ABNORMAL LOW (ref 28–170)
Saturation Ratios: 5 % — ABNORMAL LOW (ref 10.4–31.8)
TIBC: 351 ug/dL (ref 250–450)
UIBC: 335 ug/dL

## 2017-08-27 LAB — HEMOGLOBIN: Hemoglobin: 10.4 g/dL — ABNORMAL LOW (ref 12.0–15.0)

## 2017-08-27 LAB — FERRITIN: Ferritin: 20 ng/mL (ref 11–307)

## 2017-08-27 MED ORDER — EPOETIN ALFA 10000 UNIT/ML IJ SOLN
10000.0000 [IU] | INTRAMUSCULAR | Status: DC
  Administered 2017-08-27: 10000 [IU] via SUBCUTANEOUS
  Filled 2017-08-27: qty 1

## 2017-09-03 ENCOUNTER — Encounter (HOSPITAL_BASED_OUTPATIENT_CLINIC_OR_DEPARTMENT_OTHER): Payer: Medicare Other | Attending: Internal Medicine

## 2017-09-03 ENCOUNTER — Other Ambulatory Visit (HOSPITAL_COMMUNITY)
Admit: 2017-09-03 | Discharge: 2017-09-03 | Disposition: A | Discharge status: Home / Self Care | Payer: Medicare Other | Source: Ambulatory Visit | Attending: Internal Medicine | Admitting: Internal Medicine

## 2017-09-03 ENCOUNTER — Ambulatory Visit (HOSPITAL_COMMUNITY)
Admission: RE | Admit: 2017-09-03 | Discharge: 2017-09-03 | Disposition: A | Discharge status: Home / Self Care | Payer: Medicare Other | Source: Ambulatory Visit | Attending: Internal Medicine | Admitting: Internal Medicine

## 2017-09-03 ENCOUNTER — Other Ambulatory Visit (HOSPITAL_BASED_OUTPATIENT_CLINIC_OR_DEPARTMENT_OTHER): Payer: Self-pay | Admitting: Internal Medicine

## 2017-09-03 DIAGNOSIS — L97522 Non-pressure chronic ulcer of other part of left foot with fat layer exposed: Secondary | ICD-10-CM | POA: Insufficient documentation

## 2017-09-03 DIAGNOSIS — M86172 Other acute osteomyelitis, left ankle and foot: Secondary | ICD-10-CM | POA: Insufficient documentation

## 2017-09-03 DIAGNOSIS — L97529 Non-pressure chronic ulcer of other part of left foot with unspecified severity: Secondary | ICD-10-CM | POA: Insufficient documentation

## 2017-09-03 DIAGNOSIS — A480 Gas gangrene: Secondary | ICD-10-CM | POA: Diagnosis not present

## 2017-09-03 DIAGNOSIS — E1052 Type 1 diabetes mellitus with diabetic peripheral angiopathy with gangrene: Secondary | ICD-10-CM | POA: Insufficient documentation

## 2017-09-03 DIAGNOSIS — Z89429 Acquired absence of other toe(s), unspecified side: Secondary | ICD-10-CM | POA: Diagnosis not present

## 2017-09-03 DIAGNOSIS — L97524 Non-pressure chronic ulcer of other part of left foot with necrosis of bone: Secondary | ICD-10-CM

## 2017-09-03 DIAGNOSIS — L97512 Non-pressure chronic ulcer of other part of right foot with fat layer exposed: Secondary | ICD-10-CM | POA: Diagnosis not present

## 2017-09-03 DIAGNOSIS — B9562 Methicillin resistant Staphylococcus aureus infection as the cause of diseases classified elsewhere: Secondary | ICD-10-CM | POA: Insufficient documentation

## 2017-09-03 DIAGNOSIS — E10621 Type 1 diabetes mellitus with foot ulcer: Secondary | ICD-10-CM | POA: Insufficient documentation

## 2017-09-04 ENCOUNTER — Other Ambulatory Visit (HOSPITAL_BASED_OUTPATIENT_CLINIC_OR_DEPARTMENT_OTHER): Payer: Self-pay | Admitting: Internal Medicine

## 2017-09-04 DIAGNOSIS — E10621 Type 1 diabetes mellitus with foot ulcer: Secondary | ICD-10-CM

## 2017-09-04 DIAGNOSIS — L97509 Non-pressure chronic ulcer of other part of unspecified foot with unspecified severity: Principal | ICD-10-CM

## 2017-09-06 ENCOUNTER — Ambulatory Visit (HOSPITAL_COMMUNITY)
Admission: RE | Admit: 2017-09-06 | Discharge: 2017-09-06 | Disposition: A | Discharge status: Home / Self Care | Payer: Medicare Other | Source: Ambulatory Visit | Attending: Internal Medicine | Admitting: Internal Medicine

## 2017-09-06 DIAGNOSIS — E1069 Type 1 diabetes mellitus with other specified complication: Secondary | ICD-10-CM | POA: Diagnosis not present

## 2017-09-06 DIAGNOSIS — E10621 Type 1 diabetes mellitus with foot ulcer: Secondary | ICD-10-CM | POA: Insufficient documentation

## 2017-09-06 DIAGNOSIS — L97509 Non-pressure chronic ulcer of other part of unspecified foot with unspecified severity: Secondary | ICD-10-CM | POA: Insufficient documentation

## 2017-09-06 DIAGNOSIS — M868X7 Other osteomyelitis, ankle and foot: Secondary | ICD-10-CM | POA: Insufficient documentation

## 2017-09-06 LAB — AEROBIC CULTURE  (SUPERFICIAL SPECIMEN)

## 2017-09-06 LAB — AEROBIC CULTURE W GRAM STAIN (SUPERFICIAL SPECIMEN)

## 2017-09-06 IMAGING — CR DG FOOT COMPLETE 3+V*L*
3 series · 3 of 3 positions shown · non-contrast
Comparison: Left foot radiographs 03/11/2016

CLINICAL DATA: Osteomyelitis of the left foot.

EXAM:
LEFT FOOT - COMPLETE 3+ VIEW

[AP]
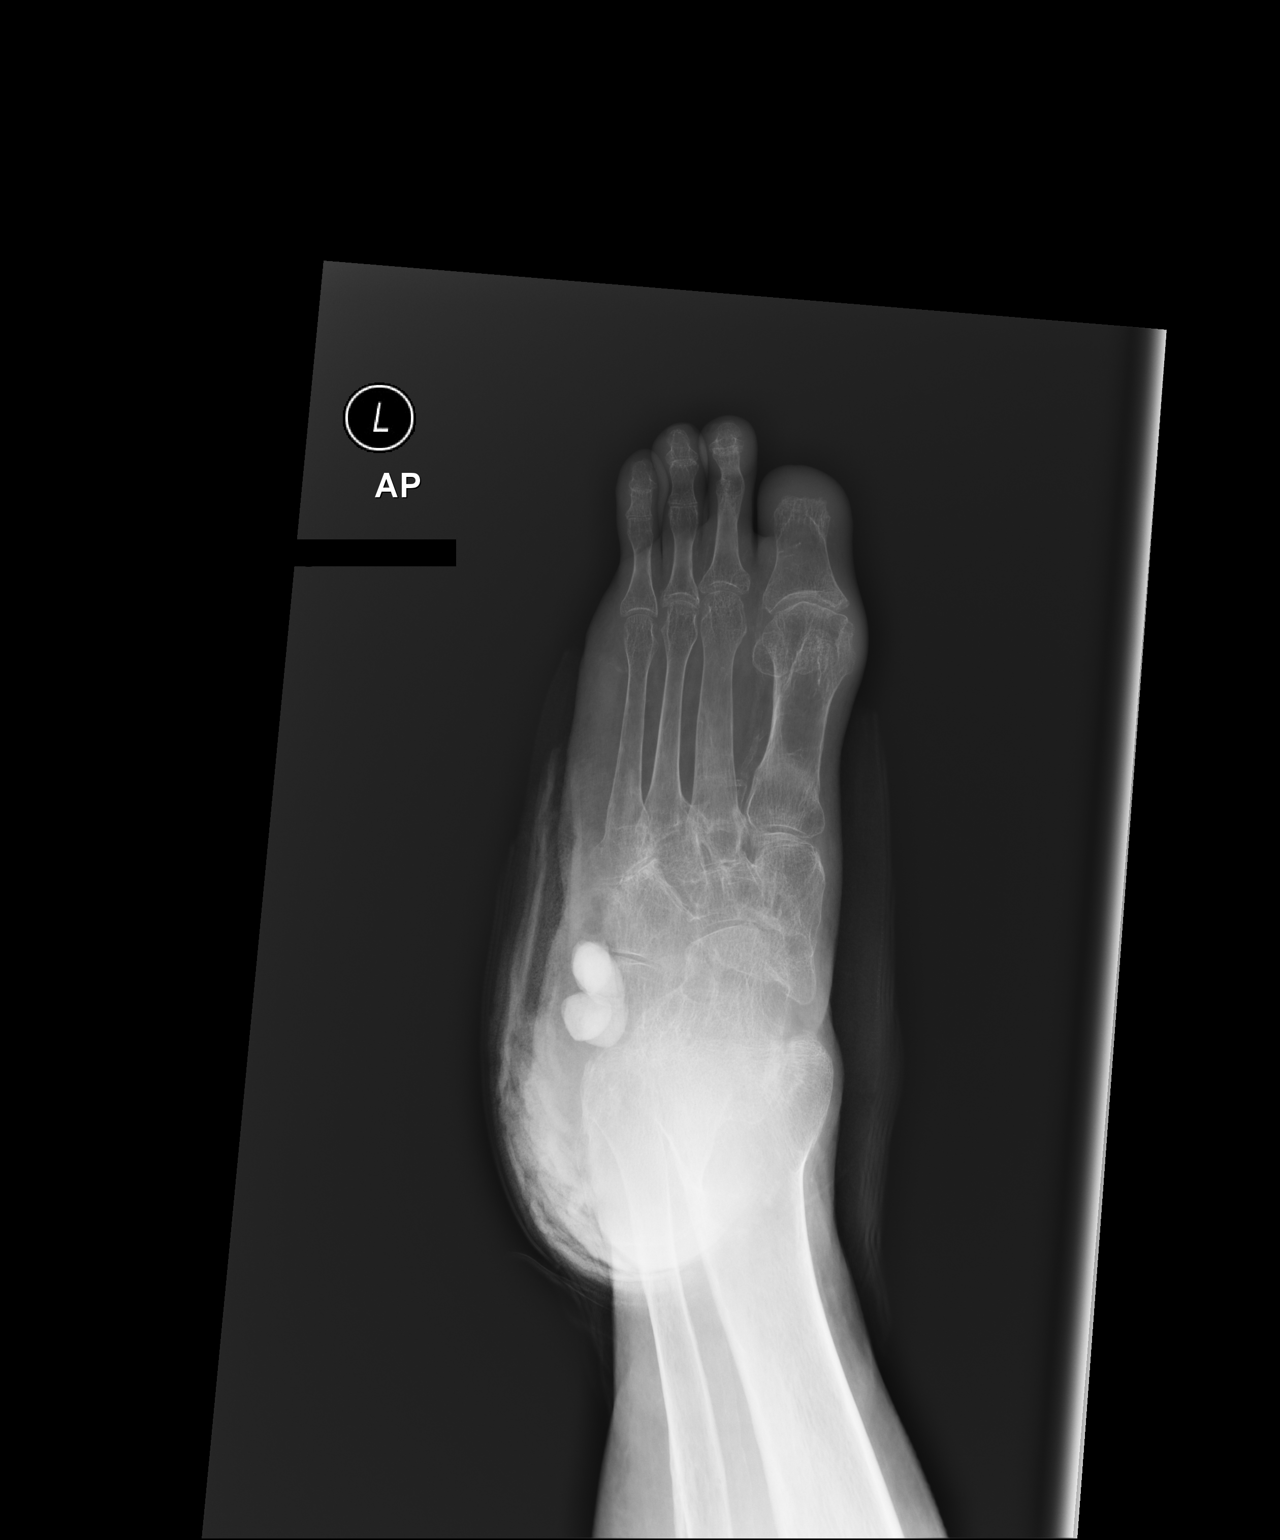

[ap obl int rot]
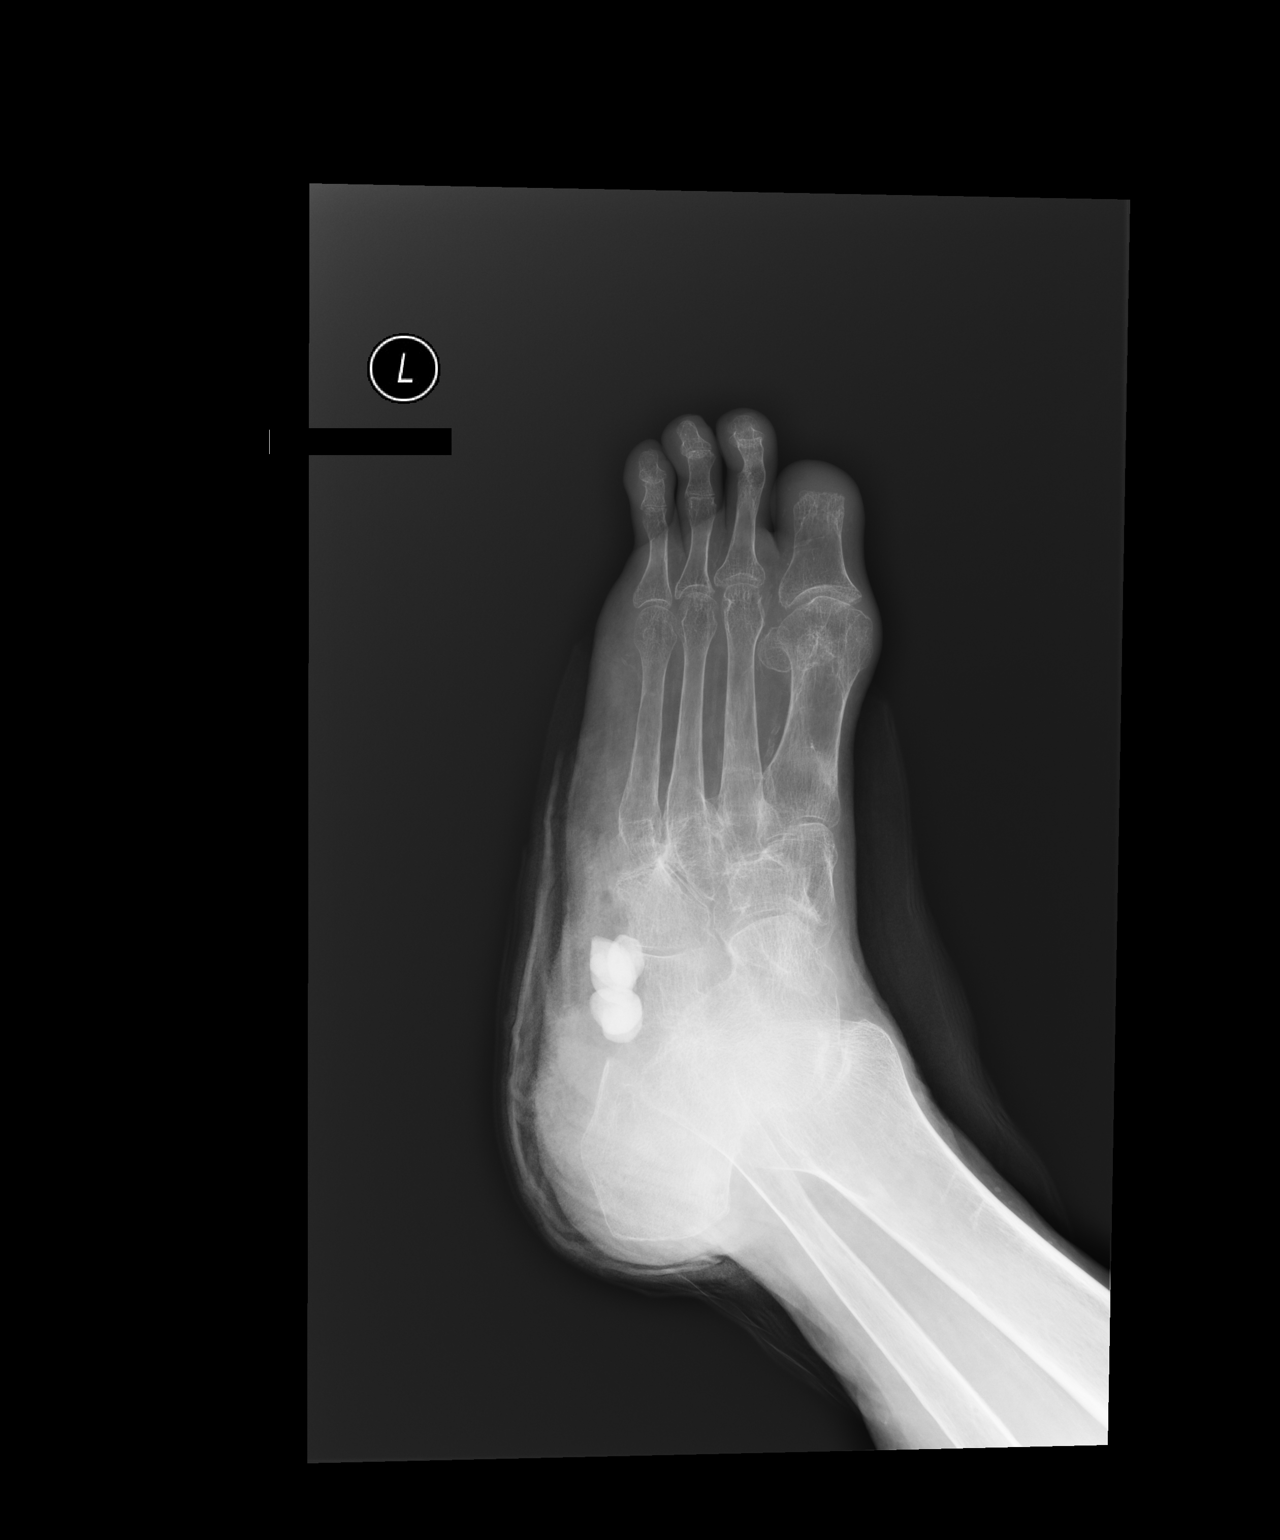

[lateral]
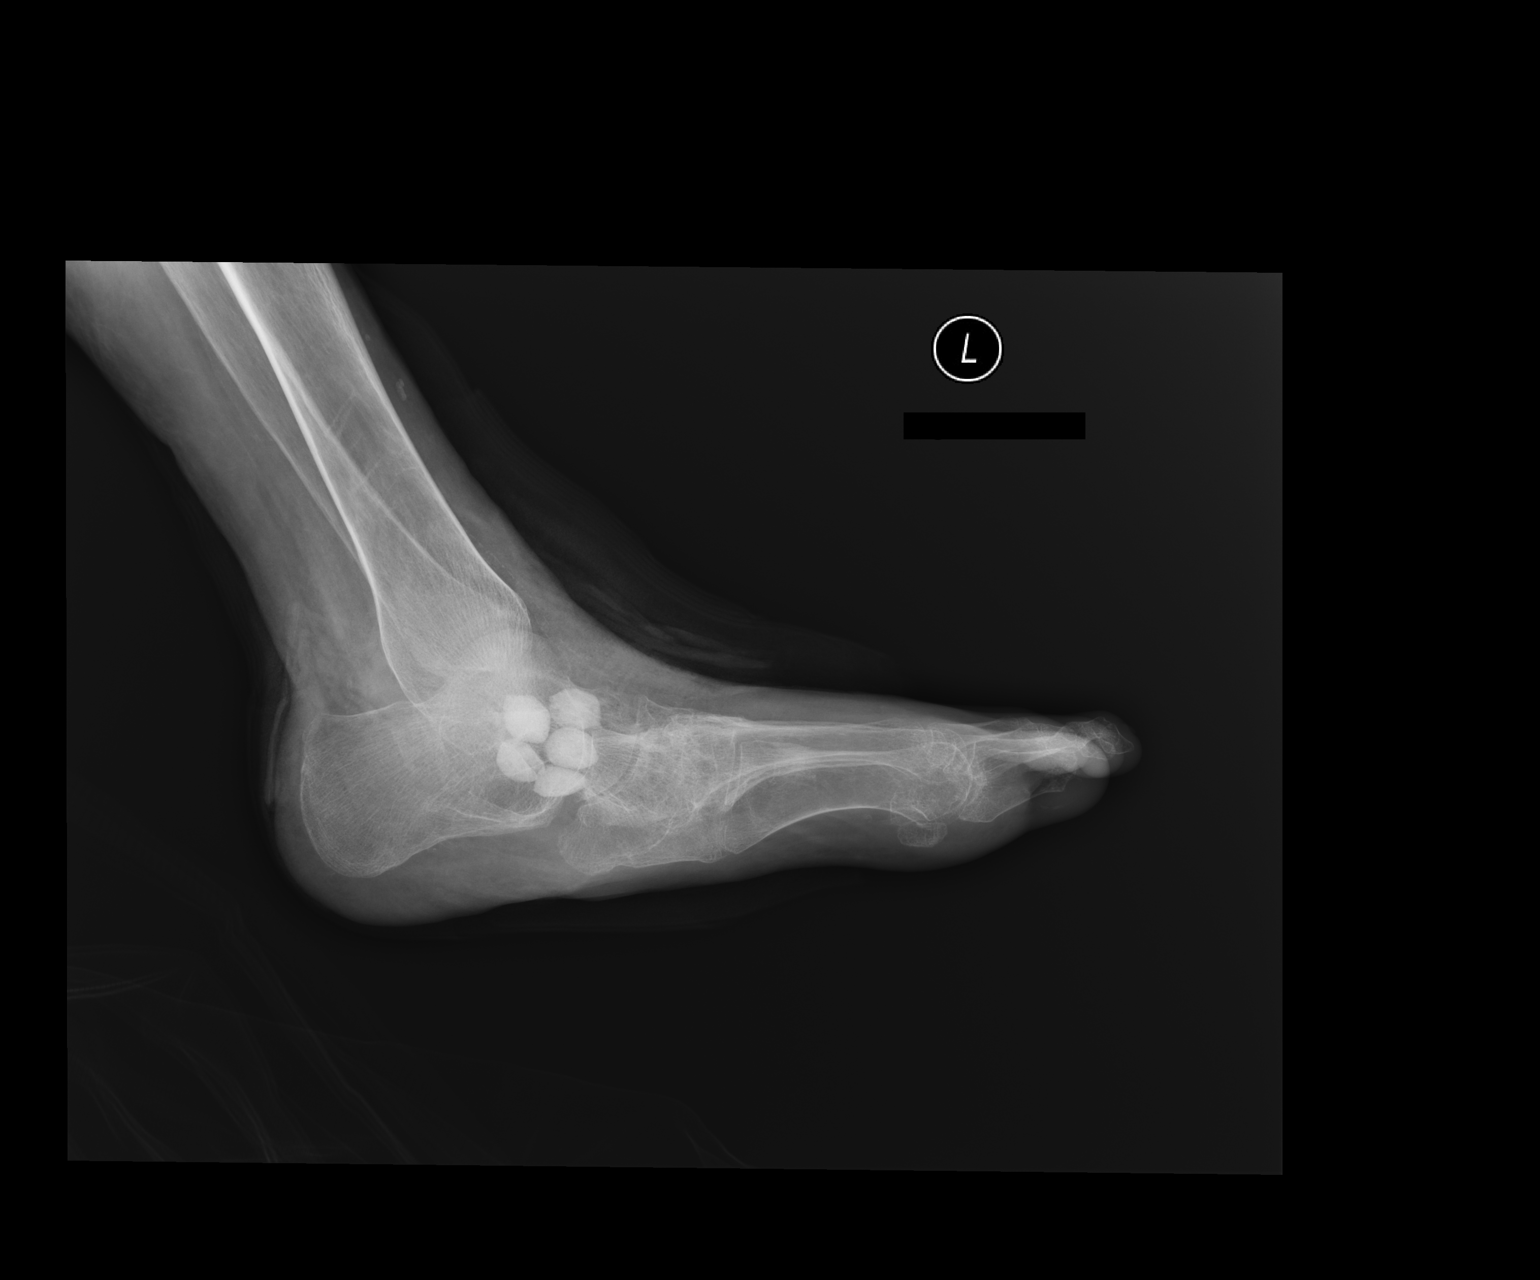

[3 of 3 positions shown; findings below may reference images not displayed]

FINDINGS: Postsurgical changes are evident at the cuboid. Osteopenia is
present. Antibiotic beads are in place.
IMPRESSION: Partial resection of the cuboid and placement of antibiotic beads.

## 2017-09-09 ENCOUNTER — Telehealth: Payer: Self-pay | Admitting: Endocrinology

## 2017-09-09 ENCOUNTER — Encounter: Payer: Self-pay | Admitting: Endocrinology

## 2017-09-09 ENCOUNTER — Ambulatory Visit (HOSPITAL_COMMUNITY): Payer: Medicare Other

## 2017-09-09 NOTE — Telephone Encounter (Signed)
Please advise 

## 2017-09-09 NOTE — Telephone Encounter (Signed)
Pt called and left detailed voicemail with MD message. Pt urged to call back if she has any future questions

## 2017-09-09 NOTE — Telephone Encounter (Signed)
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 Is there something I can do about this some kind of medicine I can take and there's something wrong with my body it is itching all over for no reason and I'm tearing my skin apart scratching it to death not sure what to do about that    Please advise to see if Dwyane Dee will see this patient for these reasons.

## 2017-09-09 NOTE — Telephone Encounter (Signed)
Her swelling is likely to be from diabetic neuropathy and there is no effective medicine for this.  She will need to check with her PCP about the itching

## 2017-09-10 ENCOUNTER — Encounter: Payer: Self-pay | Admitting: Internal Medicine

## 2017-09-10 ENCOUNTER — Ambulatory Visit (INDEPENDENT_AMBULATORY_CARE_PROVIDER_SITE_OTHER): Payer: Medicare Other | Admitting: Internal Medicine

## 2017-09-10 ENCOUNTER — Encounter (HOSPITAL_COMMUNITY): Payer: Self-pay

## 2017-09-10 ENCOUNTER — Encounter (HOSPITAL_COMMUNITY)
Admission: RE | Admit: 2017-09-10 | Discharge: 2017-09-10 | Disposition: A | Discharge status: Home / Self Care | Payer: Medicare Other | Source: Ambulatory Visit | Attending: Nephrology | Admitting: Nephrology

## 2017-09-10 DIAGNOSIS — L089 Local infection of the skin and subcutaneous tissue, unspecified: Secondary | ICD-10-CM

## 2017-09-10 DIAGNOSIS — E11628 Type 2 diabetes mellitus with other skin complications: Secondary | ICD-10-CM | POA: Diagnosis present

## 2017-09-10 DIAGNOSIS — I70235 Atherosclerosis of native arteries of right leg with ulceration of other part of foot: Secondary | ICD-10-CM | POA: Diagnosis not present

## 2017-09-10 DIAGNOSIS — D631 Anemia in chronic kidney disease: Secondary | ICD-10-CM | POA: Diagnosis present

## 2017-09-10 DIAGNOSIS — N184 Chronic kidney disease, stage 4 (severe): Secondary | ICD-10-CM | POA: Diagnosis not present

## 2017-09-10 DIAGNOSIS — E10621 Type 1 diabetes mellitus with foot ulcer: Secondary | ICD-10-CM | POA: Diagnosis not present

## 2017-09-10 LAB — HEMOGLOBIN: Hemoglobin: 10.5 g/dL — ABNORMAL LOW (ref 12.0–15.0)

## 2017-09-10 MED ORDER — SULFAMETHOXAZOLE-TRIMETHOPRIM 800-160 MG PO TABS
1.0000 | ORAL_TABLET | Freq: Two times a day (BID) | ORAL | 1 refills | Status: DC
Start: 2017-09-10 — End: 2017-10-16

## 2017-09-10 MED ORDER — EPOETIN ALFA 10000 UNIT/ML IJ SOLN
10000.0000 [IU] | INTRAMUSCULAR | Status: DC
  Administered 2017-09-10: 10000 [IU] via SUBCUTANEOUS
  Filled 2017-09-10: qty 1

## 2017-09-10 MED ORDER — METRONIDAZOLE 500 MG PO TABS: 500.0000 mg | ORAL_TABLET | Freq: Three times a day (TID) | ORAL | 1 refills | Status: DC

## 2017-09-10 NOTE — Assessment & Plan Note (Signed)
I am very pessimistic that we will be able to cure her infection and salvage her foot.  I would like her to get back to see [name], her podiatrist, as soon as possible to renew the discussion about surgical options.  I recommended PICC placement and broad IV antibiotics but she refuses that.  She tells me that every time she has had a PICC she gets a blood clot.  She is also worried that she has 3 cats and 2 dogs at home and "everything is covered with hair and dirt".  Her MRSA isolate is now resistant to doxycycline and fluoroquinolone antibiotics.  I will start her on oral trimethoprim sulfamethoxazole and metronidazole and have her stop delafloxacin.  She has baseline chronic renal insufficiency.  I will check lab work today and see her back in 1 week.

## 2017-09-10 NOTE — Progress Notes (Signed)
[facility] for [facility]ious [facility]  Patient Active Problem List   Diagnosis Date Noted  . Diabetic infection of left foot ([place]) 10/13/2014    Priority: High  . Status post transmetatarsal amputation of right foot ([place]) 09/26/2014    Priority: High  . Foot ulcer due to secondary DM ([place]) 05/09/2011    Priority: High  . Gangrene of left foot ([place]) 05/08/2017  . Osteomyelitis of left foot ([place]) 05/08/2017  . Diabetic foot infection ([place])   . Diabetic foot ulcer ([place]) 05/05/2017  . Cellulitis 05/04/2017  . Diabetes mellitus type 1 with peripheral artery disease ([place]) 01/16/2017  . Heart murmur 11/03/2015  . Protein-calorie malnutrition, moderate ([place]) 10/18/2014  . History of DVT (deep vein thrombosis)   . Chronic anemia 10/13/2014  . DM type 1 causing renal disease ([place]) 06/13/2013  . CKD (chronic kidney disease), stage III ([place]) 06/12/2013  . Charcot foot due to diabetes mellitus ([place]) 05/09/2011  . Type 1 diabetes mellitus with neurological manifestations, uncontrolled ([place])   . Hypertension   . Hypercholesteremia     Patient's Medications  New Prescriptions   METRONIDAZOLE (FLAGYL) 500 MG TABLET    Take 1 tablet (500 mg total) by mouth 3 (three) times daily.   SULFAMETHOXAZOLE-TRIMETHOPRIM (BACTRIM DS,SEPTRA DS) 800-160 MG TABLET    Take 1 tablet by mouth 2 (two) times daily.  Previous Medications   ALBUTEROL (PROVENTIL HFA;VENTOLIN HFA) 108 (90 BASE) MCG/ACT INHALER    Inhale 2 puffs into the lungs every 6 (six) hours as needed for wheezing or shortness of breath. Reported on 04/15/2015   AMLODIPINE (NORVASC) 5 MG TABLET    Take 1 tablet (5 mg total) by mouth daily.   ASPIRIN 81 MG TABLET    Take 81 mg by mouth every evening.    CALCITRIOL (ROCALTROL) 0.5 MCG CAPSULE    Take 1 capsule daily   CALCIUM-PHOSPHORUS-VITAMIN D (CALCIUM GUMMIES) 250-100-500 MG-MG-UNIT CHEW    Chew 2 each by mouth every evening.    CINNAMON PO    Take 1 tablet by mouth every evening.      CONTINUOUS BLOOD GLUC RECEIVER (FREESTYLE LIBRE READER) DEVI    1 Device by Does not apply route as directed.   CONTINUOUS BLOOD GLUC SENSOR (FREESTYLE LIBRE SENSOR SYSTEM) MISC    Use 1 sensor every 10 days   DELAFLOXACIN MEGLUMINE (BAXDELA) 450 MG TABS    Take 450 mg by mouth 2 (two) times daily.   FOLIC ACID (FOLVITE) 1 MG TABLET    Take 1 mg by mouth every evening.    FUROSEMIDE (LASIX) 40 MG TABLET    Take 40 mg by mouth daily as needed for fluid.    GLUCOSE BLOOD (CONTOUR NEXT TEST) TEST STRIP    USE AS INSTRUCTED TO CHECK BLOOD SUGAR 4 TIMES A DAY. DX E10.65   HUMALOG 100 UNIT/ML INJECTION    CALL MD IF <70, IF 151-200 =2UNITS,201-250 =4 UNITS,251-300 =6 UNITS, 301-350 =8 UNITS,351-400 =10 U   INSULIN PEN NEEDLE (PEN NEEDLES) 32G X 4 MM MISC    1 Device by Does not apply route 2 (two) times daily.   LORATADINE (CLARITIN) 10 MG TABLET    Take 1 tablet (10 mg total) by mouth daily.   NORETHINDRONE (AYGESTIN) 5 MG TABLET    Take by mouth.   ONDANSETRON (ZOFRAN-ODT) 4 MG DISINTEGRATING TABLET    DISSOLVE 1 TABLET ON TONGUE EVERY 8 HOURS AS NEEDED FOR NAUSEA  PRAVASTATIN (PRAVACHOL) 40 MG TABLET    TAKE 1 TABLET BY MOUTH EVERY DAY   PYRIDOXINE (B-6) 100 MG TABLET    Take 100 mg by mouth daily. Reported on 04/15/2015   VITAMIN C (ASCORBIC ACID) 500 MG TABLET    Take 500 mg by mouth daily.   VITAMIN D, ERGOCALCIFEROL, (DRISDOL) 50000 UNITS CAPS CAPSULE    TAKE ONE CAPSULE BY MOUTH ONE TIME PER WEEK  Modified Medications   No medications on file  Discontinued Medications   No medications on file    Subjective: [name] is in for her routine follow-up visit.  She completed 3 months of oral levofloxacin and metronidazole 1 month ago.  Recently she began to notice some swelling on the dorsum of her left foot between her third and fourth toes.  She tells me that Dr. Dellia Nims incised the area at the wound center.  A culture on 09/03/2017 grew MRSA.  She underwent an MRI on 09/06/2016 which showed  evidence of third metatarsal osteomyelitis and soft tissue infection.  She has been having increased, serous drainage from her plantar wound.  Dr. Dellia Nims recently gave her samples of delafloxacin.  Review of Systems: Review of Systems  Constitutional: Negative for chills, diaphoresis and fever.  Gastrointestinal: Negative for abdominal pain, diarrhea, nausea and vomiting.  Musculoskeletal: Negative for joint pain.    Past Medical History:  Diagnosis Date  . Anemia   . Arthritis   . Bronchitis   . C. difficile diarrhea 09/26/2014  . Cataracts, both eyes   . Cellulitis of right foot 06/02/2014  . CKD (chronic kidney disease) stage 3, GFR 30-59 ml/min (HCC)    sees Dr Justin Mend- Stage IV  . Diabetic ulcer of right foot ([place]) 06/02/2014  . DVT (deep venous thrombosis) ([place]) 10/2014   one in each one in leg- PICC line , one in right and left arm  . H/O seasonal allergies   . Heart murmur    told once when she was pregnant- no furter mention- was in notes 11/2015- had Echo 8 /4/17  . History of blood transfusion   . Hypercholesteremia   . Hypertension   . Left foot infection   . Neuropathy    feet  . Neuropathy in diabetes ([place])   . Peripheral vascular disease ([place])   . Sleep apnea    not able to use CPAP  . Staphylococcus aureus bacteremia 10/2014  . Type 1 diabetes [facility])    onset age 65    Social History   Tobacco Use  . Smoking status: Former Smoker    Years: 15.00    Last attempt to quit: 05/16/2008    Years since quitting: 9.3  . Smokeless tobacco: Never Used  Substance Use Topics  . Alcohol use: Yes    Comment: Occasional  . Drug use: No    Family History  Problem Relation Age of Onset  . Hyperlipidemia Mother   . Dementia Mother     Allergies  Allergen Reactions  . Cleocin [Clindamycin Hcl] Diarrhea    Diarrhea   . Lisinopril Other (See Comments)    Elevated potassium per pt report  . Amoxicillin Diarrhea and Nausea Only    Has patient had a PCN reaction causing  immediate rash, facial/tongue/throat swelling, SOB or lightheadedness with hypotension: No Has patient had a PCN reaction causing severe rash involving mucus membranes or skin necrosis: No Has patient had a PCN reaction that required hospitalization: No Has patient had a PCN reaction occurring  within the last 10 years: No If all of the above answers are "NO", then may proceed with Cephalosporin use.   . Bactrim [Sulfamethoxazole-Trimethoprim] Diarrhea and Nausea Only    Objective: Vitals:   09/10/17 1109  BP: (!) 173/100  Pulse: 84  Temp: 98.1 F (36.7 C)  TempSrc: Oral  Weight: 125 lb (56.7 kg)  Height: 5\' 7"  (1.702 m)   Body mass index is 19.58 kg/m.  Physical Exam  Constitutional: She is oriented to person, place, and time.  She is in no distress other than being upset and frustrated about recent developments.  Musculoskeletal:  Her left fifth toe and metatarsal ray are surgically absent.  The large, shaggy plantar wound persist and is looking worse today.  She has increased serous drainage from the wound.  Neurological: She is alert and oriented to person, place, and time.        Left foot MRI 09/06/2017  IMPRESSION: 1. Findings are consistent with progressive left foot infection with ill-defined soft tissue abscesses laterally, extensor tenosynovitis and osteomyelitis. 2. The findings of osteomyelitis are definitive within the 3rd metatarsal shaft and head and bases of the 3rd and 4th proximal phalanges. There are less specific T2 marrow changes in the 2nd and 4th metatarsal heads without cortical destruction.   Electronically Signed   By: Richardean Sale M.D.   On: 09/06/2017 16:30    Problem List Items Addressed This Visit      High   Diabetic infection of left foot ([place])    I am very pessimistic that we will be able to cure her infection and salvage her foot.  I would like her to get back to see [name], her podiatrist, as soon as possible to renew  the discussion about surgical options.  I recommended PICC placement and broad IV antibiotics but she refuses that.  She tells me that every time she has had a PICC she gets a blood clot.  She is also worried that she has 3 cats and 2 dogs at home and "everything is covered with hair and dirt".  Her MRSA isolate is now resistant to doxycycline and fluoroquinolone antibiotics.  I will start her on oral trimethoprim sulfamethoxazole and metronidazole and have her stop delafloxacin.  She has baseline chronic renal insufficiency.  I will check lab work today and see her back in 1 week.      Relevant Medications   sulfamethoxazole-trimethoprim (BACTRIM DS,SEPTRA DS) 800-160 MG tablet   metroNIDAZOLE (FLAGYL) 500 MG tablet   Other Relevant Orders   CBC   Basic metabolic panel   C-reactive protein   Sedimentation rate       Michel Bickers, MD [facility] for [facility] [phone] pager   [phone] cell 09/10/2017, 12:47 PM

## 2017-09-11 LAB — BASIC METABOLIC PANEL
BUN / CREAT RATIO: 18 (calc) (ref 6–22)
BUN: 47 mg/dL — ABNORMAL HIGH (ref 7–25)
CALCIUM: 7.8 mg/dL — AB (ref 8.6–10.2)
CHLORIDE: 98 mmol/L (ref 98–110)
CO2: 16 mmol/L — AB (ref 20–32)
Creat: 2.58 mg/dL — ABNORMAL HIGH (ref 0.50–1.10)
GLUCOSE: 238 mg/dL — AB (ref 65–99)
POTASSIUM: 5.1 mmol/L (ref 3.5–5.3)
Sodium: 129 mmol/L — ABNORMAL LOW (ref 135–146)

## 2017-09-11 LAB — CBC
HCT: 34.2 % — ABNORMAL LOW (ref 35.0–45.0)
Hemoglobin: 10.7 g/dL — ABNORMAL LOW (ref 11.7–15.5)
MCH: 24.7 pg — AB (ref 27.0–33.0)
MCHC: 31.3 g/dL — AB (ref 32.0–36.0)
MCV: 79 fL — AB (ref 80.0–100.0)
MPV: 10 fL (ref 7.5–12.5)
Platelets: 601 10*3/uL — ABNORMAL HIGH (ref 140–400)
RBC: 4.33 10*6/uL (ref 3.80–5.10)
RDW: 15.8 % — ABNORMAL HIGH (ref 11.0–15.0)
WBC: 6.4 10*3/uL (ref 3.8–10.8)

## 2017-09-11 LAB — C-REACTIVE PROTEIN: CRP: 3.6 mg/L (ref <8.0)

## 2017-09-11 LAB — SEDIMENTATION RATE: Sed Rate: 79 mm/h — ABNORMAL HIGH (ref 0–20)

## 2017-09-15 DIAGNOSIS — M86172 Other acute osteomyelitis, left ankle and foot: Secondary | ICD-10-CM

## 2017-09-15 DIAGNOSIS — L97519 Non-pressure chronic ulcer of other part of right foot with unspecified severity: Secondary | ICD-10-CM

## 2017-09-15 DIAGNOSIS — L97529 Non-pressure chronic ulcer of other part of left foot with unspecified severity: Secondary | ICD-10-CM

## 2017-09-15 DIAGNOSIS — E08622 Diabetes mellitus due to underlying condition with other skin ulcer: Secondary | ICD-10-CM

## 2017-09-18 ENCOUNTER — Ambulatory Visit (INDEPENDENT_AMBULATORY_CARE_PROVIDER_SITE_OTHER): Payer: Medicare Other | Admitting: Podiatry

## 2017-09-18 ENCOUNTER — Other Ambulatory Visit: Payer: Self-pay

## 2017-09-18 DIAGNOSIS — M86172 Other acute osteomyelitis, left ankle and foot: Secondary | ICD-10-CM | POA: Diagnosis not present

## 2017-09-18 DIAGNOSIS — L97529 Non-pressure chronic ulcer of other part of left foot with unspecified severity: Secondary | ICD-10-CM | POA: Diagnosis not present

## 2017-09-18 DIAGNOSIS — L97519 Non-pressure chronic ulcer of other part of right foot with unspecified severity: Secondary | ICD-10-CM | POA: Diagnosis not present

## 2017-09-18 DIAGNOSIS — E08621 Diabetes mellitus due to underlying condition with foot ulcer: Secondary | ICD-10-CM | POA: Diagnosis not present

## 2017-09-18 NOTE — Progress Notes (Signed)
HPI: 47 year old female with a history of multiple amputations and infections presents today for evaluation of a new finding regarding osteomyelitis to the left foot.  Patient has been managed at the [facility] wound care facility and she was doing well however she did develop some osteomyelitis to the left third and fourth rays of the foot.  She presents today for surgical consultation regarding amputation  Past Medical History:  Diagnosis Date  . Anemia   . Arthritis   . Bronchitis   . C. difficile diarrhea 09/26/2014  . Cataracts, both eyes   . Cellulitis of right foot 06/02/2014  . CKD (chronic kidney disease) stage 3, GFR 30-59 ml/min (HCC)    sees Dr Justin Mend- Stage IV  . Diabetic ulcer of right foot ([place]) 06/02/2014  . DVT (deep venous thrombosis) ([place]) 10/2014   one in each one in leg- PICC line , one in right and left arm  . H/O seasonal allergies   . Heart murmur    told once when she was pregnant- no furter mention- was in notes 11/2015- had Echo 8 /4/17  . History of blood transfusion   . Hypercholesteremia   . Hypertension   . Left foot infection   . Neuropathy    feet  . Neuropathy in diabetes ([place])   . Peripheral vascular disease ([place])   . Sleep apnea    not able to use CPAP  . Staphylococcus aureus bacteremia 10/2014  . Type 1 diabetes ([place])    onset age 10     Physical Exam: General: The patient is alert and oriented x3 in no acute distress.  Dermatology: Skin is warm, dry and supple bilateral lower extremities.  Chronic ulcer noted to the plantar aspect of the left foot measuring approximately 3.0 x 2.0 x 0.2 cm.  Wound base appears granular with no significant amount of drainage.  No malodor noted.  Periwound integrity is intact.  Vascular: Palpable pedal pulses bilaterally. No edema or erythema noted. Capillary refill within normal limits.  Neurological: Epicritic and protective threshold absent bilaterally.   Musculoskeletal Exam: Prior transmetatarsal  amputation right foot without any complications of the moment.  History of fifth ray amputation left foot.  Is a bit  MRI IMPRESSION 09/06/2017: 1. Findings are consistent with progressive left foot infection with ill-defined soft tissue abscesses laterally, extensor tenosynovitis and osteomyelitis. 2. The findings of osteomyelitis are definitive within the 3rd metatarsal shaft and head and bases of the 3rd and 4th proximal phalanges. There are less specific T2 marrow changes in the 2nd and 4th metatarsal heads without cortical destruction.  Assessment: 1. Osteomyelitis left foot 2. Diabetic foot ulcer left foot  Plan of Care:  1. Patient evaluated.  2. Today we discussed the conservative versus surgical management of the presenting pathology. The patient opts for surgical management. All possible complications and details of the procedure were explained. All patient questions were answered. No guarantees were expressed or implied. 3. Authorization for surgery was initiated today. Surgery will consist of transmetatarsal amputation left foot with incision and drainage left foot. 4.  Continue oral antibiotics as per infectious disease Dr. Megan Salon.  Oral antibiotics are Bactrim and Flagyl. 5.  Patient may require IV antibiotics postsurgically. 6.  Return to clinic 1 week postop         Edrick Kins, DPM [facility]  Dr. Edrick Kins, DPM    [address]  [place], [place] [address]                Office [phone]  Fax [phone]

## 2017-09-18 NOTE — Patient Instructions (Signed)
Pre-Operative Instructions  Congratulations, you have decided to take an important step towards improving your quality of life.  You can be assured that the doctors and staff at [facility] will be with you every step of the way.  Here are some important things you should know:  1. Plan to be at the surgery center/hospital at least 1 (one) hour prior to your scheduled time, unless otherwise directed by the surgical center/hospital staff.  You must have a responsible adult accompany you, remain during the surgery and drive you home.  Make sure you have directions to the surgical center/hospital to ensure you arrive on time. 2. If you are having surgery at [facility] or [facility] hospitals, you will need a copy of your medical history and physical form from your family physician within one month prior to the date of surgery. We will give you a form for your primary physician to complete.  3. We make every effort to accommodate the date you request for surgery.  However, there are times where surgery dates or times have to be moved.  We will contact you as soon as possible if a change in schedule is required.   4. No aspirin/ibuprofen for one week before surgery.  If you are on aspirin, any non-steroidal anti-inflammatory medications (Mobic, Aleve, Ibuprofen) should not be taken seven (7) days prior to your surgery.  You make take Tylenol for pain prior to surgery.  5. Medications - If you are taking daily heart and blood pressure medications, seizure, reflux, allergy, asthma, anxiety, pain or diabetes medications, make sure you notify the surgery center/hospital before the day of surgery so they can tell you which medications you should take or avoid the day of surgery. 6. No food or drink after midnight the night before surgery unless directed otherwise by surgical center/hospital staff. 7. No alcoholic beverages 24-hours prior to surgery.  No smoking 24-hours prior or 24-hours after  surgery. 8. Wear loose pants or shorts. They should be loose enough to fit over bandages, boots, and casts. 9. Don't wear slip-on shoes. Sneakers are preferred. 10. Bring your boot with you to the surgery center/hospital.  Also bring crutches or a walker if your physician has prescribed it for you.  If you do not have this equipment, it will be provided for you after surgery. 11. If you have not been contacted by the surgery center/hospital by the day before your surgery, call to confirm the date and time of your surgery. 12. Leave-time from work may vary depending on the type of surgery you have.  Appropriate arrangements should be made prior to surgery with your employer. 13. Prescriptions will be provided immediately following surgery by your doctor.  Fill these as soon as possible after surgery and take the medication as directed. Pain medications will not be refilled on weekends and must be approved by the doctor. 14. Remove nail polish on the operative foot and avoid getting pedicures prior to surgery. 15. Wash the night before surgery.  The night before surgery wash the foot and leg well with water and the antibacterial soap provided. Be sure to pay special attention to beneath the toenails and in between the toes.  Wash for at least three (3) minutes. Rinse thoroughly with water and dry well with a towel.  Perform this wash unless told not to do so by your physician.  Enclosed: 1 Ice pack (please put in freezer the night before surgery)   1 Hibiclens skin cleaner     Pre-op instructions  If you have any questions regarding the instructions, please do not hesitate to call our office.  [place]: [address], [place], [place] [address] -- [phone]  [place]: [address] [place], [place] [address] -- [phone]  [place]: [address]  [place], [place] [address] -- [phone]  [place]: [name], [address], [place], [place] [address] -- [phone]  Website:  [email or web address] 

## 2017-09-19 ENCOUNTER — Encounter: Payer: Self-pay | Admitting: Internal Medicine

## 2017-09-19 ENCOUNTER — Ambulatory Visit (INDEPENDENT_AMBULATORY_CARE_PROVIDER_SITE_OTHER): Payer: Medicare Other | Admitting: Internal Medicine

## 2017-09-19 DIAGNOSIS — I70235 Atherosclerosis of native arteries of right leg with ulceration of other part of foot: Secondary | ICD-10-CM

## 2017-09-19 DIAGNOSIS — E11628 Type 2 diabetes mellitus with other skin complications: Secondary | ICD-10-CM | POA: Diagnosis present

## 2017-09-19 DIAGNOSIS — E10621 Type 1 diabetes mellitus with foot ulcer: Secondary | ICD-10-CM | POA: Diagnosis not present

## 2017-09-19 DIAGNOSIS — L089 Local infection of the skin and subcutaneous tissue, unspecified: Secondary | ICD-10-CM

## 2017-09-19 NOTE — Assessment & Plan Note (Signed)
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  I certainly support transmetatarsal amputation.  She is still very worried about complications if she were to have a PICC line placed.  I told her that given the improvement she has had so far on oral antibiotics I would simply continue them with antiemetics as needed.  She will follow-up here in about 1 month following surgery.

## 2017-09-19 NOTE — Progress Notes (Signed)
[facility] for Infectious Disease  Patient Active Problem List   Diagnosis Date Noted  . Diabetic infection of left foot ([place]) 10/13/2014    Priority: High  . Status post transmetatarsal amputation of right foot ([place]) 09/26/2014    Priority: High  . Foot ulcer due to secondary DM ([place]) 05/09/2011    Priority: High  . Gangrene of left foot ([place]) 05/08/2017  . Osteomyelitis of left foot ([place]) 05/08/2017  . Diabetic foot infection ([place])   . Diabetic foot ulcer ([place]) 05/05/2017  . Cellulitis 05/04/2017  . Diabetes mellitus type 1 with peripheral artery disease ([place]) 01/16/2017  . Anxiety 03/23/2016  . Heart murmur 11/03/2015  . Acute renal failure superimposed on stage 4 chronic kidney disease ([place]) 05/31/2015  . Protein-calorie malnutrition, moderate ([place] ) 10/18/2014  . History of DVT (deep vein thrombosis)   . Chronic anemia 10/13/2014  . DM type 1 causing renal disease ([place]) 06/13/2013  . CKD (chronic kidney disease), stage III ([place]) 06/12/2013  . Charcot foot due to diabetes mellitus ([place]) 05/09/2011  . Type 1 diabetes mellitus with neurological manifestations, uncontrolled ([place])   . Hypertension   . Hypercholesteremia     Patient's Medications  New Prescriptions   No medications on file  Previous Medications   ALBUTEROL (PROVENTIL HFA;VENTOLIN HFA) 108 (90 BASE) MCG/ACT INHALER    Inhale 2 puffs into the lungs every 6 (six) hours as needed for wheezing or shortness of breath. Reported on 04/15/2015   AMLODIPINE (NORVASC) 5 MG TABLET    Take 1 tablet (5 mg total) by mouth daily.   ASPIRIN 81 MG TABLET    Take 81 mg by mouth every evening.    CALCITRIOL (ROCALTROL) 0.5 MCG CAPSULE    Take 1 capsule daily   CALCIUM-PHOSPHORUS-VITAMIN D (CALCIUM GUMMIES) 250-100-500 MG-MG-UNIT CHEW    Chew 2 each by mouth every evening.    CINNAMON PO    Take 1 tablet by mouth every evening.    CONTINUOUS BLOOD GLUC RECEIVER (FREESTYLE LIBRE READER) DEVI    1 Device by Does not  apply route as directed.   CONTINUOUS BLOOD GLUC SENSOR (FREESTYLE LIBRE SENSOR SYSTEM) MISC    Use 1 sensor every 10 days   DELAFLOXACIN MEGLUMINE (BAXDELA) 450 MG TABS    Take 450 mg by mouth 2 (two) times daily.   FOLIC ACID (FOLVITE) 1 MG TABLET    Take 1 mg by mouth every evening.    FUROSEMIDE (LASIX) 40 MG TABLET    Take 40 mg by mouth daily as needed for fluid.    GLUCOSE BLOOD (CONTOUR NEXT TEST) TEST STRIP    USE AS INSTRUCTED TO CHECK BLOOD SUGAR 4 TIMES A DAY. DX E10.65   HUMALOG 100 UNIT/ML INJECTION    CALL MD IF <70, IF 151-200 =2UNITS,201-250 =4 UNITS,251-300 =6 UNITS, 301-350 =8 UNITS,351-400 =10 U   INSULIN PEN NEEDLE (PEN NEEDLES) 32G X 4 MM MISC    1 Device by Does not apply route 2 (two) times daily.   KETOROLAC (ACULAR) 0.5 % OPHTHALMIC SOLUTION    PLACE 1 DROP IN RIGHT EYE FOUR TIMES A DAY. START 1 WEEK PRIOR TO SURGERY   LORATADINE (CLARITIN) 10 MG TABLET    Take 1 tablet (10 mg total) by mouth daily.   METRONIDAZOLE (FLAGYL) 500 MG TABLET    Take 1 tablet (500 mg total) by mouth 3 (three) times daily.   NORETHINDRONE (AYGESTIN) 5 MG TABLET  Take by mouth.   ONDANSETRON (ZOFRAN-ODT) 4 MG DISINTEGRATING TABLET    DISSOLVE 1 TABLET ON TONGUE EVERY 8 HOURS AS NEEDED FOR NAUSEA   PRAVASTATIN (PRAVACHOL) 40 MG TABLET    TAKE 1 TABLET BY MOUTH EVERY DAY   PYRIDOXINE (B-6) 100 MG TABLET    Take 100 mg by mouth daily. Reported on 04/15/2015   SULFAMETHOXAZOLE-TRIMETHOPRIM (BACTRIM DS,SEPTRA DS) 800-160 MG TABLET    Take 1 tablet by mouth 2 (two) times daily.   VITAMIN B-12 (V-R VITAMIN B-12) 500 MCG TABLET    Take by mouth.   VITAMIN C (ASCORBIC ACID) 500 MG TABLET    Take 500 mg by mouth daily.   VITAMIN D, ERGOCALCIFEROL, (DRISDOL) 50000 UNITS CAPS CAPSULE    TAKE ONE CAPSULE BY MOUTH ONE TIME PER WEEK  Modified Medications   No medications on file  Discontinued Medications   No medications on file    Subjective: [name] is in for her routine follow-up visit.  She started  on oral trimethoprim sulfamethoxazole and metronidazole 2 weeks ago for her severe left foot infection with osteomyelitis.  She has had some nausea but has been able to take her medication.  She met with her surgeon, [name] and is planning transmetatarsal amputation in mid July.  Review of Systems: Review of Systems  Constitutional: Negative for chills, diaphoresis and fever.  Gastrointestinal: Positive for nausea and vomiting. Negative for abdominal pain and diarrhea.    Past Medical History:  Diagnosis Date  . Anemia   . Arthritis   . Bronchitis   . C. difficile diarrhea 09/26/2014  . Cataracts, both eyes   . Cellulitis of right foot 06/02/2014  . CKD (chronic kidney disease) stage 3, GFR 30-59 ml/min (HCC)    sees Dr Justin Mend- Stage IV  . Diabetic ulcer of right foot ([place]) 06/02/2014  . DVT (deep venous thrombosis) ([place]) 10/2014   one in each one in leg- PICC line , one in right and left arm  . H/O seasonal allergies   . Heart murmur    told once when she was pregnant- no furter mention- was in notes 11/2015- had Echo 8 /4/17  . History of blood transfusion   . Hypercholesteremia   . Hypertension   . Left foot infection   . Neuropathy    feet  . Neuropathy in diabetes ([place] [place])   . Peripheral vascular disease ([place])   . Sleep apnea    not able to use CPAP  . Staphylococcus aureus bacteremia 10/2014  . Type 1 diabetes [facility])    onset age 9    Social History   Tobacco Use  . Smoking status: Former Smoker    Years: 15.00    Last attempt to quit: 05/16/2008    Years since quitting: 9.3  . Smokeless tobacco: Never Used  Substance Use Topics  . Alcohol use: Yes    Comment: Occasional  . Drug use: No    Family History  Problem Relation Age of Onset  . Hyperlipidemia Mother   . Dementia Mother     Allergies  Allergen Reactions  . Cleocin [Clindamycin Hcl] Diarrhea    Diarrhea   . Lisinopril Other (See Comments)    Elevated potassium per pt report  . Amoxicillin Diarrhea  and Nausea Only    Has patient had a PCN reaction causing immediate rash, facial/tongue/throat swelling, SOB or lightheadedness with hypotension: No Has patient had a PCN reaction causing severe rash involving mucus membranes or skin necrosis:  No Has patient had a PCN reaction that required hospitalization: No Has patient had a PCN reaction occurring within the last 10 years: No If all of the above answers are "NO", then may proceed with Cephalosporin use.   . Bactrim [Sulfamethoxazole-Trimethoprim] Diarrhea and Nausea Only    Objective: Vitals:   09/19/17 1104  BP: (!) 146/89  Pulse: 89  Temp: 98.3 F (36.8 C)  TempSrc: Oral   There is no height or weight on file to calculate BMI.  Physical Exam  Constitutional:  As usual she is cheerful and in very good spirits.  Musculoskeletal:  She has had dramatic improvement in her left foot wound over the past 2 weeks.        Lab Results    Problem List Items Addressed This Visit      High   Diabetic infection of left foot ([employer])    She has had significant healing of her left foot wound but I am sure that she still has significant metatarsal osteomyelitis.  I certainly support transmetatarsal amputation.  She is still very worried about complications if she were to have a PICC line placed.  I told her that given the improvement she has had so far on oral antibiotics I would simply continue them with antiemetics as needed.  She will follow-up here in about 1 month following surgery.          Michel Bickers, MD [facility] [place] [facility] [phone] pager   [phone] cell 09/19/2017, 11:14 AM

## 2017-09-21 ENCOUNTER — Encounter: Payer: Self-pay | Admitting: Internal Medicine

## 2017-09-21 DIAGNOSIS — M869 Osteomyelitis, unspecified: Secondary | ICD-10-CM

## 2017-09-24 ENCOUNTER — Ambulatory Visit (HOSPITAL_COMMUNITY)
Admission: RE | Admit: 2017-09-24 | Discharge: 2017-09-24 | Disposition: A | Discharge status: Home / Self Care | Payer: Medicare Other | Source: Ambulatory Visit | Attending: Internal Medicine | Admitting: Internal Medicine

## 2017-09-24 ENCOUNTER — Encounter (HOSPITAL_COMMUNITY): Payer: Self-pay | Admitting: Interventional Radiology

## 2017-09-24 ENCOUNTER — Telehealth: Payer: Self-pay | Admitting: Pharmacist

## 2017-09-24 ENCOUNTER — Other Ambulatory Visit: Payer: Self-pay | Admitting: Internal Medicine

## 2017-09-24 ENCOUNTER — Encounter (HOSPITAL_COMMUNITY)
Admission: RE | Admit: 2017-09-24 | Discharge: 2017-09-24 | Disposition: A | Discharge status: Home / Self Care | Payer: Medicare Other | Source: Ambulatory Visit | Attending: Internal Medicine | Admitting: Internal Medicine

## 2017-09-24 DIAGNOSIS — Z89431 Acquired absence of right foot: Secondary | ICD-10-CM | POA: Diagnosis not present

## 2017-09-24 DIAGNOSIS — N184 Chronic kidney disease, stage 4 (severe): Secondary | ICD-10-CM | POA: Diagnosis not present

## 2017-09-24 DIAGNOSIS — I878 Other specified disorders of veins: Secondary | ICD-10-CM | POA: Insufficient documentation

## 2017-09-24 DIAGNOSIS — N189 Chronic kidney disease, unspecified: Secondary | ICD-10-CM | POA: Insufficient documentation

## 2017-09-24 DIAGNOSIS — R269 Unspecified abnormalities of gait and mobility: Secondary | ICD-10-CM | POA: Diagnosis not present

## 2017-09-24 DIAGNOSIS — Z4781 Encounter for orthopedic aftercare following surgical amputation: Secondary | ICD-10-CM | POA: Diagnosis not present

## 2017-09-24 DIAGNOSIS — L03119 Cellulitis of unspecified part of limb: Secondary | ICD-10-CM | POA: Diagnosis not present

## 2017-09-24 DIAGNOSIS — M869 Osteomyelitis, unspecified: Secondary | ICD-10-CM

## 2017-09-24 HISTORY — PX: IR US GUIDE VASC ACCESS RIGHT: IMG2390

## 2017-09-24 HISTORY — PX: IR FLUORO GUIDE CV LINE RIGHT: IMG2283

## 2017-09-24 MED ORDER — HEPARIN SOD (PORK) LOCK FLUSH 100 UNIT/ML IV SOLN: 250.0000 [IU] | INTRAVENOUS | Status: DC | PRN

## 2017-09-24 MED ORDER — LIDOCAINE HCL (PF) 1 % IJ SOLN
INTRAMUSCULAR | Status: DC | PRN
  Administered 2017-09-24: 5 mL

## 2017-09-24 MED ORDER — LIDOCAINE HCL 1 % IJ SOLN
INTRAMUSCULAR | Status: AC
  Filled 2017-09-24: qty 20

## 2017-09-24 MED ORDER — HEPARIN SOD (PORK) LOCK FLUSH 100 UNIT/ML IV SOLN
INTRAVENOUS | Status: AC
  Filled 2017-09-24: qty 5

## 2017-09-24 MED ORDER — HEPARIN SOD (PORK) LOCK FLUSH 100 UNIT/ML IV SOLN
INTRAVENOUS | Status: AC
  Administered 2017-09-24: 250 [IU]
  Filled 2017-09-24: qty 5

## 2017-09-24 MED ORDER — VANCOMYCIN HCL IN DEXTROSE 1-5 GM/200ML-% IV SOLN
1000.0000 mg | INTRAVENOUS | Status: AC
  Administered 2017-09-24: 1000 mg via INTRAVENOUS
  Filled 2017-09-24: qty 200

## 2017-09-24 NOTE — Procedures (Signed)
Pre procedural Diagnosis: Poor venous access Post Procedural Diagnosis: Same  Successful placement of right IJ approach single lumen 20 cm tunneled CVC with tip at the superior caval-atrial junction.    EBL: None  No immediate post procedural complication.  The PICC line is ready for immediate use.  Ronny Bacon, MD Pager #: [phone]

## 2017-09-24 NOTE — Telephone Encounter (Signed)
Patient was set to receive IV vancomycin through her PICC line for MRSA foot osteomyelitis. Patient has CKD stage III at baseline with most recent SCr at 2.58 (CrCl ~25 ml/min). Discussed with Dr. Megan Salon and will see if Daptomycin is covered under her insurance.  Talked with Jeani Hawking at [facility] and Daptomycin is covered.  Will do 8 mg/kg q48hr (CrCl is <30 so must decrease to q48h). So patient will receive Daptomycin 450 mg IV q48hr. Dr. Megan Salon aware and agrees with plan.

## 2017-09-24 NOTE — Telephone Encounter (Signed)
I am in full agreement with this plan.

## 2017-09-26 ENCOUNTER — Encounter (HOSPITAL_COMMUNITY)
Admission: RE | Admit: 2017-09-26 | Discharge: 2017-09-26 | Disposition: A | Discharge status: Home / Self Care | Payer: Medicare Other | Source: Ambulatory Visit | Attending: Nephrology | Admitting: Nephrology

## 2017-09-26 ENCOUNTER — Encounter (HOSPITAL_COMMUNITY): Payer: Self-pay

## 2017-09-26 ENCOUNTER — Other Ambulatory Visit (INDEPENDENT_AMBULATORY_CARE_PROVIDER_SITE_OTHER): Payer: Medicare Other

## 2017-09-26 DIAGNOSIS — E7800 Pure hypercholesterolemia, unspecified: Secondary | ICD-10-CM | POA: Diagnosis not present

## 2017-09-26 DIAGNOSIS — E1065 Type 1 diabetes mellitus with hyperglycemia: Secondary | ICD-10-CM

## 2017-09-26 DIAGNOSIS — E10621 Type 1 diabetes mellitus with foot ulcer: Secondary | ICD-10-CM | POA: Diagnosis not present

## 2017-09-26 DIAGNOSIS — N184 Chronic kidney disease, stage 4 (severe): Secondary | ICD-10-CM | POA: Diagnosis not present

## 2017-09-26 LAB — LIPID PANEL
CHOL/HDL RATIO: 8
CHOLESTEROL: 176 mg/dL (ref 0–200)
HDL: 22.6 mg/dL — AB (ref >39.00)
Triglycerides: 465 mg/dL — ABNORMAL HIGH (ref 0.0–149.0)

## 2017-09-26 LAB — COMPREHENSIVE METABOLIC PANEL
ALK PHOS: 143 U/L — AB (ref 39–117)
ALT: 27 U/L (ref 0–35)
AST: 21 U/L (ref 0–37)
Albumin: 3.3 g/dL — ABNORMAL LOW (ref 3.5–5.2)
BUN: 45 mg/dL — ABNORMAL HIGH (ref 6–23)
CHLORIDE: 108 meq/L (ref 96–112)
CO2: 15 meq/L — AB (ref 19–32)
Calcium: 7.6 mg/dL — ABNORMAL LOW (ref 8.4–10.5)
Creatinine, Ser: 3.19 mg/dL — ABNORMAL HIGH (ref 0.40–1.20)
GFR: 16.64 mL/min — AB (ref >60.00)
GLUCOSE: 275 mg/dL — AB (ref 70–99)
POTASSIUM: 4.3 meq/L (ref 3.5–5.1)
SODIUM: 132 meq/L — AB (ref 135–145)
TOTAL PROTEIN: 6.7 g/dL (ref 6.0–8.3)
Total Bilirubin: 0.2 mg/dL (ref 0.2–1.2)

## 2017-09-26 LAB — HEMOGLOBIN: Hemoglobin: 8.8 g/dL — ABNORMAL LOW (ref 12.0–15.0)

## 2017-09-26 LAB — IRON AND TIBC
Iron: 25 ug/dL — ABNORMAL LOW (ref 28–170)
Saturation Ratios: 8 % — ABNORMAL LOW (ref 10.4–31.8)
TIBC: 327 ug/dL (ref 250–450)
UIBC: 302 ug/dL

## 2017-09-26 LAB — HEMOGLOBIN A1C: Hgb A1c MFr Bld: 10.8 % — ABNORMAL HIGH (ref 4.6–6.5)

## 2017-09-26 LAB — FERRITIN: Ferritin: 9 ng/mL — ABNORMAL LOW (ref 11–307)

## 2017-09-26 LAB — LDL CHOLESTEROL, DIRECT: Direct LDL: 78 mg/dL

## 2017-09-26 MED ORDER — EPOETIN ALFA 10000 UNIT/ML IJ SOLN
10000.0000 [IU] | INTRAMUSCULAR | Status: DC
  Administered 2017-09-26: 10000 [IU] via SUBCUTANEOUS
  Filled 2017-09-26: qty 1

## 2017-09-27 ENCOUNTER — Other Ambulatory Visit: Payer: Medicare Other

## 2017-09-29 ENCOUNTER — Encounter: Payer: Self-pay | Admitting: Internal Medicine

## 2017-09-30 ENCOUNTER — Telehealth: Payer: Self-pay

## 2017-09-30 ENCOUNTER — Ambulatory Visit: Payer: Medicare Other | Admitting: Endocrinology

## 2017-09-30 ENCOUNTER — Telehealth: Payer: Self-pay | Admitting: Pharmacist

## 2017-09-30 NOTE — Telephone Encounter (Signed)
[name] does her own dressing changes.  She can wait and get blood work when she gets back to [place].  Thanks.

## 2017-09-30 NOTE — Telephone Encounter (Signed)
Sounds great.  Thank you. 

## 2017-09-30 NOTE — Telephone Encounter (Signed)
Just spoke with [name] again at [facility]. She is leaving on the 2nd and coming back on the 10th which is within the window for stability, so [name] is sending out all of the daptomycin for that time.  Do you want her to get dressing changes and labs while she is gone or wait until she is back next week? If so, triage will need to coordinate that for her [facility] does not do this) at [facility]. Just let me know and I'll send to triage.

## 2017-09-30 NOTE — Telephone Encounter (Signed)
Amy, Pharmacist from [facility], called me to tell me that she got a voicemail from the patient asking Rafael Gonzalez to send Daptomycin x 10-14 days to her house because she is going out of town and leaving at [employer].  Amy states they cannot send that much as it is not stable for that long. Amy tried to call patient back twice but has not heard back from her yet.  She wanted [place] to know and asked if we had another plan? Will route to Dr. Megan Salon.

## 2017-09-30 NOTE — Telephone Encounter (Signed)
Unfortunately, we cannot come up with a reasonable alternative plan without talking to [facility] and figuring out what she is doing.  Please try to contact her and ask what her plans are.

## 2017-09-30 NOTE — Telephone Encounter (Signed)
Patent walked into clinic with questions regarding oral antibiotic and IV medications.  She wants to know if she should continue oral bactrim and flagyl with IV antibiotics.   Per patient Email on 09-30-17 Dr Megan Salon responded she should continue oral antibiotics along with IV daptomycin .    Laverle Patter, RN

## 2017-10-02 ENCOUNTER — Other Ambulatory Visit: Payer: Self-pay | Admitting: Pharmacist

## 2017-10-02 NOTE — Progress Notes (Signed)
[facility] pharmacy lab review  

## 2017-10-03 ENCOUNTER — Other Ambulatory Visit: Payer: Self-pay | Admitting: Endocrinology

## 2017-10-10 ENCOUNTER — Encounter (HOSPITAL_COMMUNITY): Payer: Medicare Other

## 2017-10-11 ENCOUNTER — Other Ambulatory Visit: Payer: Self-pay | Admitting: Pharmacist

## 2017-10-11 NOTE — Progress Notes (Signed)
[facility] pharmacy lab review  

## 2017-10-15 ENCOUNTER — Encounter: Payer: Self-pay | Admitting: Podiatry

## 2017-10-15 ENCOUNTER — Encounter: Payer: Self-pay | Admitting: Internal Medicine

## 2017-10-16 ENCOUNTER — Other Ambulatory Visit: Payer: Self-pay | Admitting: Internal Medicine

## 2017-10-16 DIAGNOSIS — L089 Local infection of the skin and subcutaneous tissue, unspecified: Principal | ICD-10-CM

## 2017-10-16 DIAGNOSIS — E11628 Type 2 diabetes mellitus with other skin complications: Secondary | ICD-10-CM

## 2017-10-16 MED ORDER — SULFAMETHOXAZOLE-TRIMETHOPRIM 800-160 MG PO TABS: 1.0000 | ORAL_TABLET | Freq: Two times a day (BID) | ORAL | 1 refills | Status: DC

## 2017-10-18 ENCOUNTER — Other Ambulatory Visit: Payer: Self-pay | Admitting: Pharmacist

## 2017-10-18 ENCOUNTER — Other Ambulatory Visit: Payer: Self-pay | Admitting: Internal Medicine

## 2017-10-18 LAB — CBC WITH DIFFERENTIAL/PLATELET
Basophils Absolute: 22 cells/uL (ref 0–200)
Basophils Relative: 0.4 %
EOS ABS: 269 {cells}/uL (ref 15–500)
Eosinophils Relative: 4.8 %
HCT: 29.9 % — ABNORMAL LOW (ref 35.0–45.0)
HEMOGLOBIN: 9.1 g/dL — AB (ref 11.7–15.5)
Lymphs Abs: 1680 cells/uL (ref 850–3900)
MCH: 24.9 pg — AB (ref 27.0–33.0)
MCHC: 30.4 g/dL — AB (ref 32.0–36.0)
MCV: 81.7 fL (ref 80.0–100.0)
MONOS PCT: 5.6 %
MPV: 10.1 fL (ref 7.5–12.5)
NEUTROS ABS: 3315 {cells}/uL (ref 1500–7800)
Neutrophils Relative %: 59.2 %
Platelets: 451 10*3/uL — ABNORMAL HIGH (ref 140–400)
RBC: 3.66 10*6/uL — ABNORMAL LOW (ref 3.80–5.10)
RDW: 18 % — ABNORMAL HIGH (ref 11.0–15.0)
TOTAL LYMPHOCYTE: 30 %
WBC mixed population: 314 cells/uL (ref 200–950)
WBC: 5.6 10*3/uL (ref 3.8–10.8)

## 2017-10-18 LAB — CK: Total CK: 65 U/L (ref 29–143)

## 2017-10-18 LAB — BASIC METABOLIC PANEL WITH GFR
BUN/Creatinine Ratio: 12 (calc) (ref 6–22)
BUN: 46 mg/dL — AB (ref 7–25)
CALCIUM: 8.3 mg/dL — AB (ref 8.6–10.2)
CO2: 17 mmol/L — ABNORMAL LOW (ref 20–32)
CREATININE: 3.89 mg/dL — AB (ref 0.50–1.10)
Chloride: 105 mmol/L (ref 98–110)
GFR, EST AFRICAN AMERICAN: 15 mL/min/{1.73_m2} — AB (ref >=60)
GFR, EST NON AFRICAN AMERICAN: 13 mL/min/{1.73_m2} — AB (ref >=60)
Glucose, Bld: 324 mg/dL — ABNORMAL HIGH (ref 65–139)
Potassium: 5.5 mmol/L — ABNORMAL HIGH (ref 3.5–5.3)
Sodium: 134 mmol/L — ABNORMAL LOW (ref 135–146)

## 2017-10-18 LAB — SEDIMENTATION RATE: Sed Rate: 72 mm/h — ABNORMAL HIGH (ref 0–20)

## 2017-10-18 LAB — C-REACTIVE PROTEIN: CRP: 0.6 mg/L (ref <8.0)

## 2017-10-18 IMAGING — CR DG RIBS W/ CHEST 3+V*L*
3 series · 3 of 3 positions shown · non-contrast
Comparison: 02/24/2016

CLINICAL DATA: Left lower rib pain for 1 month after moving heavy
furniture. Initial encounter.

EXAM:
LEFT RIBS AND CHEST - 3+ VIEW

[w chest pa]
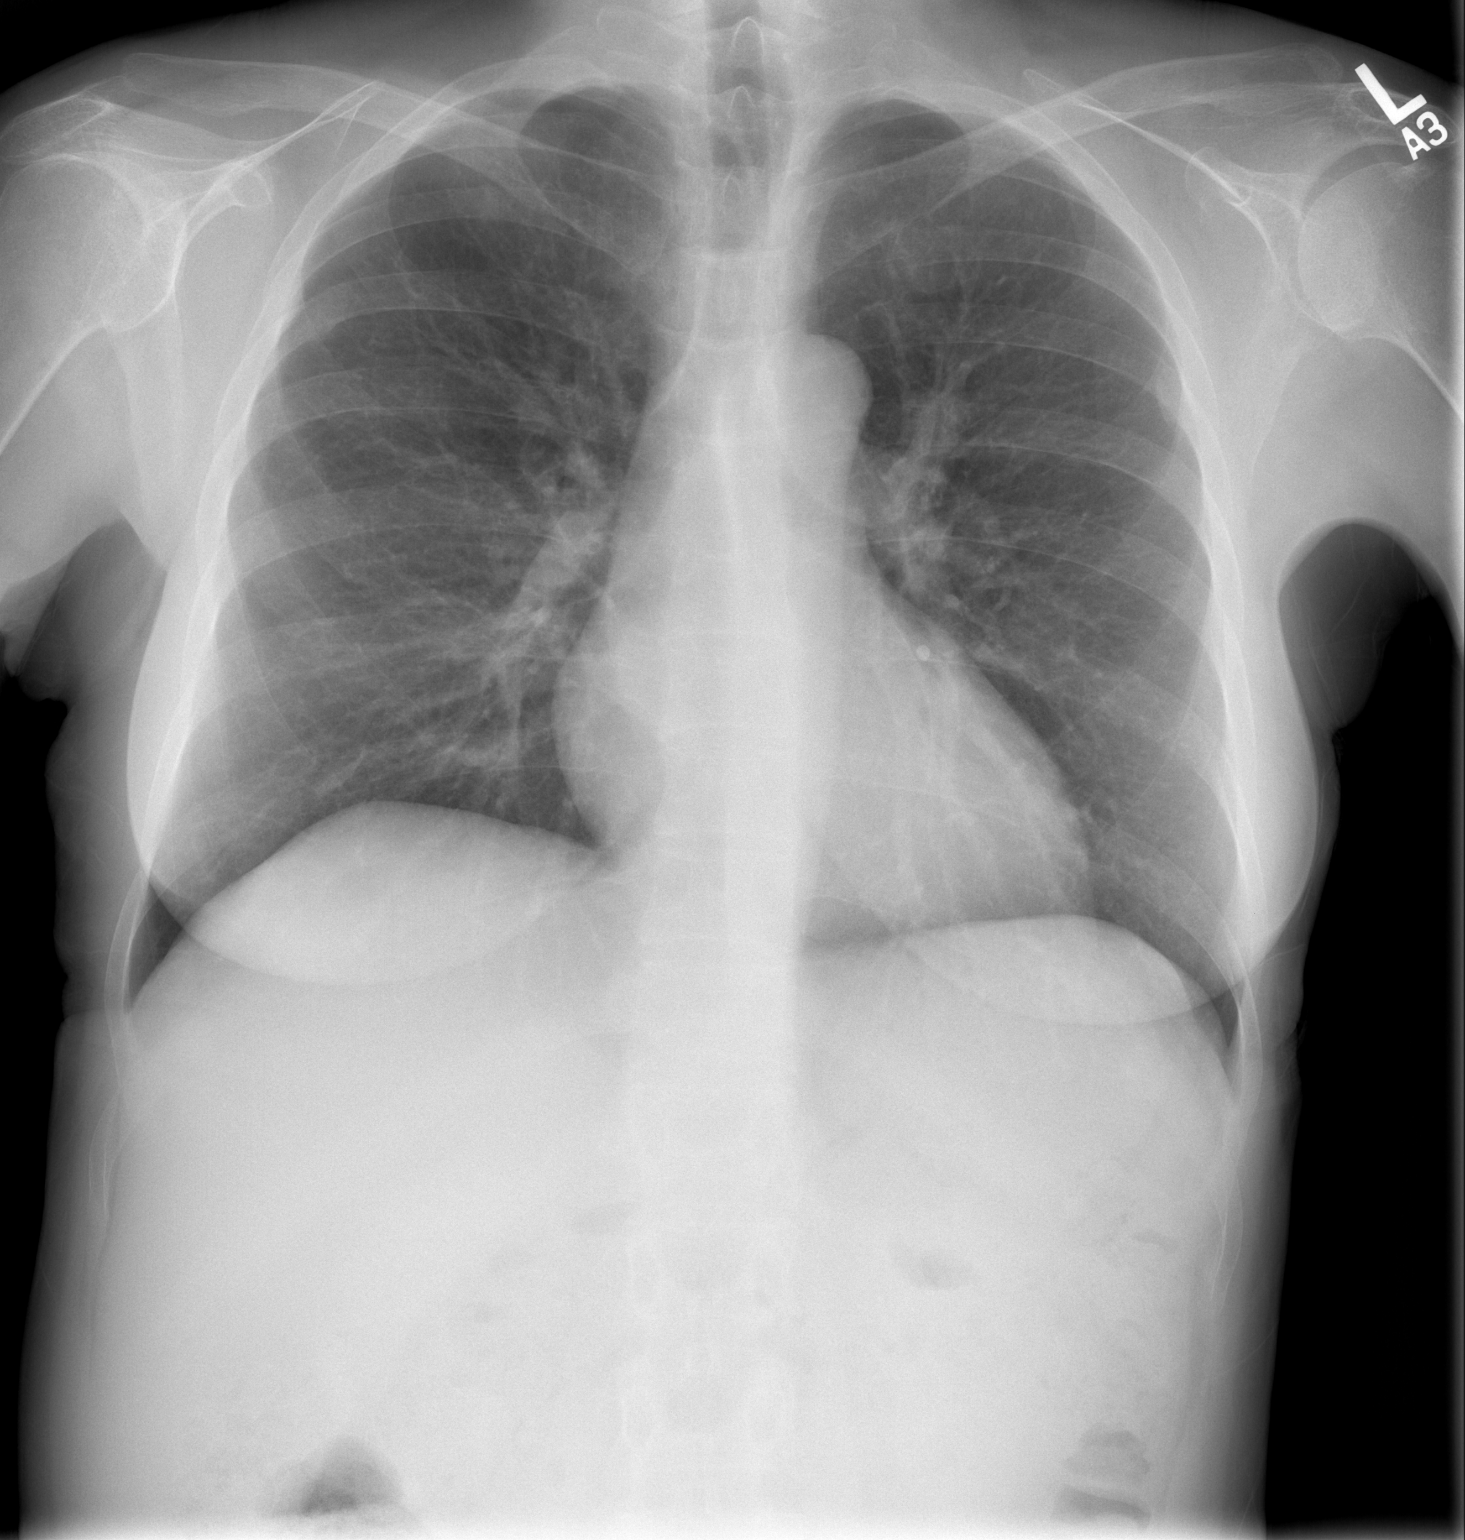

[w ribs ap/pa upper left]
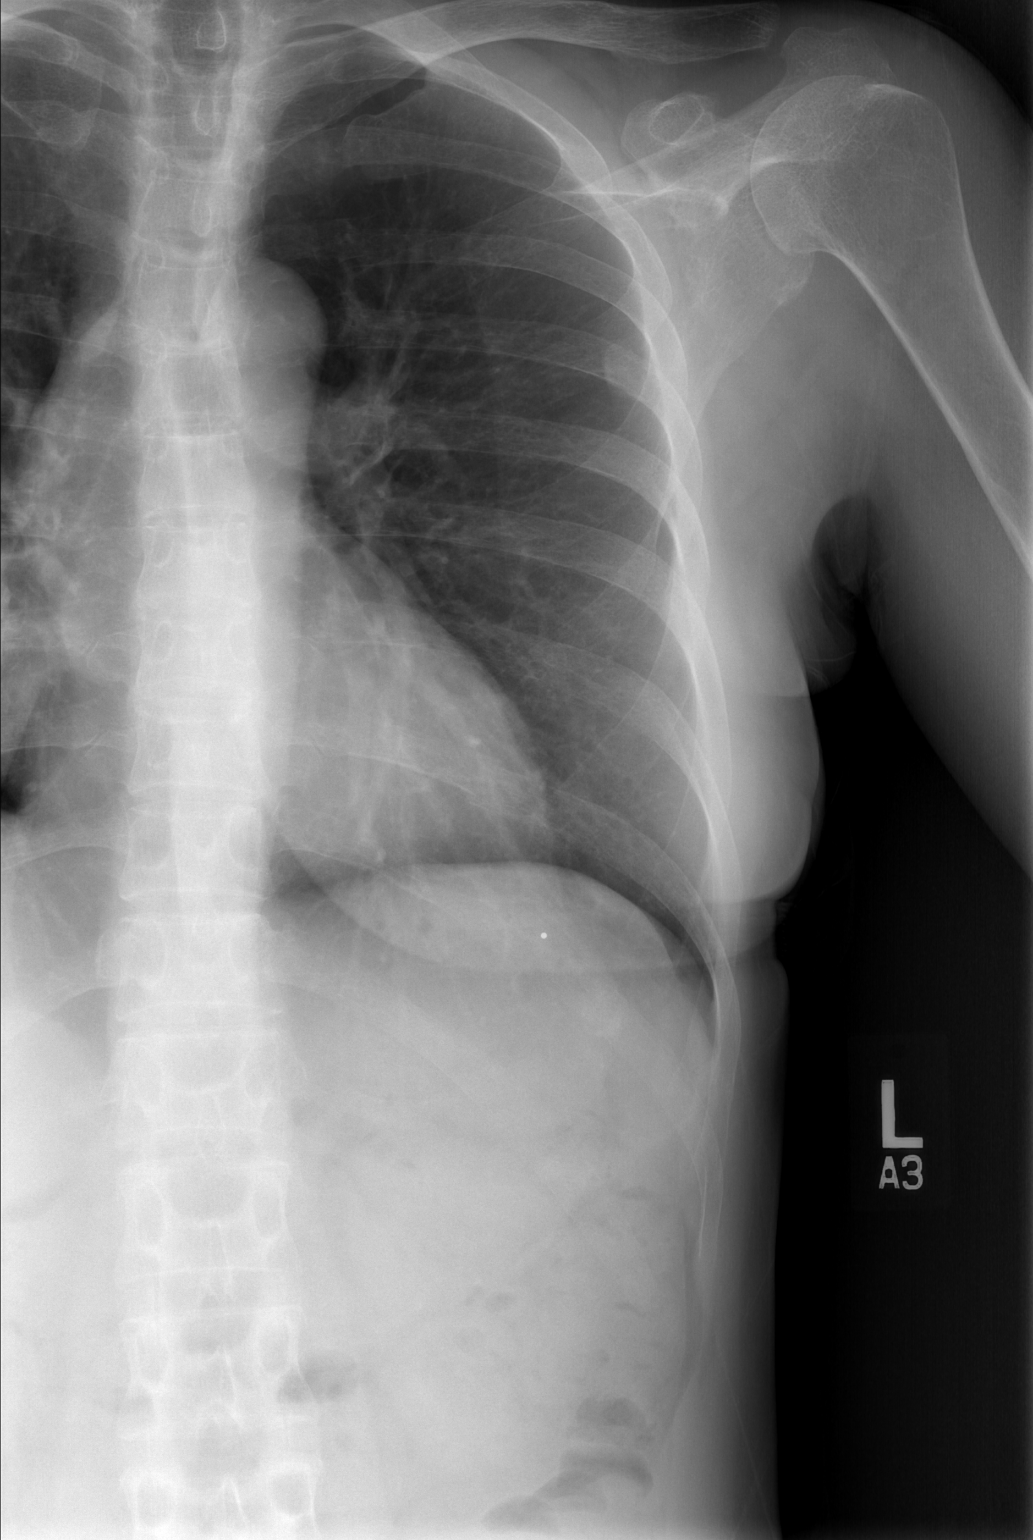

[w ribs oblique left]
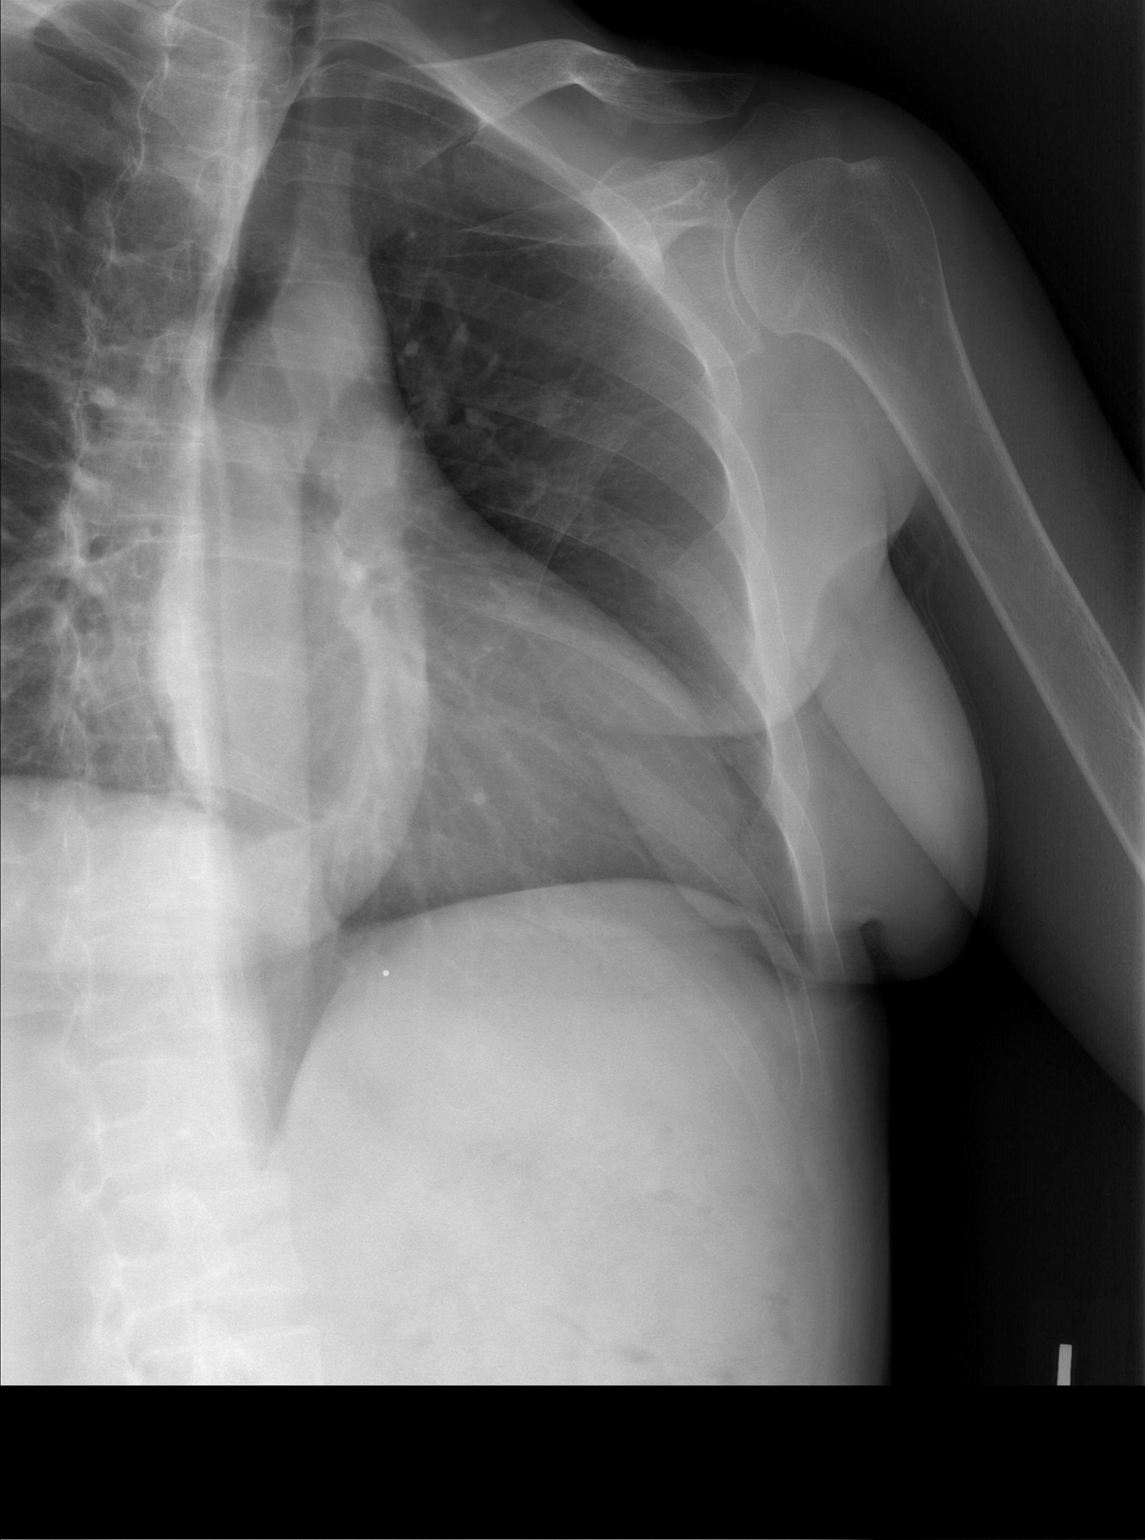

[3 of 3 positions shown; findings below may reference images not displayed]

FINDINGS: Mildly displaced fracture of the left posterior ninth rib is seen.
No other definite rib fractures identified.

There is no evidence of pneumothorax or pleural effusion. Both lungs
are clear. Heart size and mediastinal contours are within normal
limits.
IMPRESSION: Left posterior ninth rib fracture. No evidence of pneumothorax or
hemothorax.

## 2017-10-18 MED ORDER — FERUMOXYTOL INJECTION 510 MG/17 ML: 510.0000 mg | INTRAVENOUS | Status: DC

## 2017-10-18 MED ORDER — SODIUM CHLORIDE 0.9 % IV SOLN: INTRAVENOUS | Status: DC

## 2017-10-18 NOTE — Progress Notes (Signed)
[facility] pharmacy lab review  

## 2017-10-21 ENCOUNTER — Encounter (HOSPITAL_BASED_OUTPATIENT_CLINIC_OR_DEPARTMENT_OTHER): Payer: Medicare Other | Attending: Internal Medicine

## 2017-10-21 DIAGNOSIS — A480 Gas gangrene: Secondary | ICD-10-CM | POA: Diagnosis not present

## 2017-10-21 DIAGNOSIS — G4730 Sleep apnea, unspecified: Secondary | ICD-10-CM | POA: Diagnosis not present

## 2017-10-21 DIAGNOSIS — E1052 Type 1 diabetes mellitus with diabetic peripheral angiopathy with gangrene: Secondary | ICD-10-CM | POA: Diagnosis not present

## 2017-10-21 DIAGNOSIS — I10 Essential (primary) hypertension: Secondary | ICD-10-CM | POA: Insufficient documentation

## 2017-10-21 DIAGNOSIS — E1069 Type 1 diabetes mellitus with other specified complication: Secondary | ICD-10-CM | POA: Insufficient documentation

## 2017-10-21 DIAGNOSIS — E1040 Type 1 diabetes mellitus with diabetic neuropathy, unspecified: Secondary | ICD-10-CM | POA: Insufficient documentation

## 2017-10-21 DIAGNOSIS — E10621 Type 1 diabetes mellitus with foot ulcer: Secondary | ICD-10-CM | POA: Diagnosis present

## 2017-10-21 DIAGNOSIS — B9562 Methicillin resistant Staphylococcus aureus infection as the cause of diseases classified elsewhere: Secondary | ICD-10-CM | POA: Insufficient documentation

## 2017-10-21 DIAGNOSIS — Z86718 Personal history of other venous thrombosis and embolism: Secondary | ICD-10-CM | POA: Insufficient documentation

## 2017-10-21 DIAGNOSIS — M86172 Other acute osteomyelitis, left ankle and foot: Secondary | ICD-10-CM | POA: Diagnosis not present

## 2017-10-21 DIAGNOSIS — L97524 Non-pressure chronic ulcer of other part of left foot with necrosis of bone: Secondary | ICD-10-CM | POA: Insufficient documentation

## 2017-10-21 LAB — BASIC METABOLIC PANEL WITH GFR
BUN/Creatinine Ratio: 11 (calc) (ref 6–22)
BUN: 37 mg/dL — AB (ref 7–25)
CO2: 19 mmol/L — AB (ref 20–32)
Calcium: 8.2 mg/dL — ABNORMAL LOW
Chloride: 105 mmol/L (ref 98–110)
Creat: 3.33 mg/dL — ABNORMAL HIGH
Glucose, Bld: 305 mg/dL — ABNORMAL HIGH (ref 65–139)
Potassium: 5.3 mmol/L (ref 3.5–5.3)
SODIUM: 131 mmol/L — AB (ref 135–146)

## 2017-10-21 NOTE — Progress Notes (Signed)
Approval given by Erline Levine with Dr Jason Nest office to receive both procrit and feraheme on same day.  Discussed insurance approval.

## 2017-10-22 ENCOUNTER — Other Ambulatory Visit: Payer: Self-pay | Admitting: Internal Medicine

## 2017-10-23 ENCOUNTER — Other Ambulatory Visit: Payer: Self-pay | Admitting: Pharmacist

## 2017-10-23 NOTE — Progress Notes (Signed)
[facility] pharmacy lab review  

## 2017-10-24 ENCOUNTER — Encounter (HOSPITAL_COMMUNITY): Payer: Self-pay

## 2017-10-24 ENCOUNTER — Encounter (HOSPITAL_COMMUNITY)
Admission: RE | Admit: 2017-10-24 | Discharge: 2017-10-24 | Disposition: A | Discharge status: Home / Self Care | Payer: Medicare Other | Source: Ambulatory Visit | Attending: Nephrology | Admitting: Nephrology

## 2017-10-24 ENCOUNTER — Telehealth: Payer: Self-pay | Admitting: *Deleted

## 2017-10-24 ENCOUNTER — Encounter: Payer: Self-pay | Admitting: Family

## 2017-10-24 DIAGNOSIS — N184 Chronic kidney disease, stage 4 (severe): Secondary | ICD-10-CM | POA: Diagnosis not present

## 2017-10-24 DIAGNOSIS — D631 Anemia in chronic kidney disease: Secondary | ICD-10-CM | POA: Insufficient documentation

## 2017-10-24 LAB — HEMOGLOBIN: Hemoglobin: 10.2 g/dL — ABNORMAL LOW (ref 12.0–15.0)

## 2017-10-24 LAB — IRON AND TIBC
IRON: 83 ug/dL (ref 28–170)
Saturation Ratios: 24 % (ref 10.4–31.8)
TIBC: 347 ug/dL (ref 250–450)
UIBC: 264 ug/dL

## 2017-10-24 LAB — FERRITIN: FERRITIN: 12 ng/mL (ref 11–307)

## 2017-10-24 MED ORDER — EPOETIN ALFA 10000 UNIT/ML IJ SOLN
10000.0000 [IU] | INTRAMUSCULAR | Status: DC
  Administered 2017-10-24: 10000 [IU] via SUBCUTANEOUS
  Filled 2017-10-24 (×2): qty 1

## 2017-10-24 MED ORDER — SODIUM CHLORIDE 0.9 % IV SOLN
510.0000 mg | INTRAVENOUS | Status: DC
  Administered 2017-10-24: 510 mg via INTRAVENOUS
  Filled 2017-10-24: qty 17

## 2017-10-24 MED ORDER — SODIUM CHLORIDE 0.9 % IV SOLN
INTRAVENOUS | Status: AC
  Administered 2017-10-24: 09:00:00 via INTRAVENOUS

## 2017-10-24 NOTE — Telephone Encounter (Signed)
Spoke with Amy at [place] per Marya Amsler, daptomycin dose was changed to 6mg /kg q 48 hours. Landis Gandy, RN

## 2017-10-24 NOTE — Telephone Encounter (Signed)
  Thanks Brinkley. I'm gonna let [employer]ry Pr[employer] cover my inbox tomorrow too

## 2017-10-24 NOTE — Progress Notes (Signed)
First dose of feraheme given.  Pt tolerated well. Pt was observed for 30 minutes after infusion.  Once [email or web address], procrit was given sq.  Pt d/c via her wheelchair.  Pt was given d/c instructions on feraheme and iron rich diet.

## 2017-10-24 NOTE — Discharge Instructions (Signed)
Iron-Rich Diet Iron is a mineral that helps your body to produce hemoglobin. Hemoglobin is a protein in your red blood cells that carries oxygen to your body's tissues. Eating too little iron may cause you to feel weak and tired, and it can increase your risk for infection. Eating enough iron is necessary for your body's metabolism, muscle function, and nervous system. Iron is naturally found in many foods. It can also be added to foods or fortified in foods. There are two types of dietary iron:  Heme iron. Heme iron is absorbed by the body more easily than nonheme iron. Heme iron is found in meat, poultry, and fish.  Nonheme iron. Nonheme iron is found in dietary supplements, iron-fortified grains, beans, and vegetables.  You may need to follow an iron-rich diet if:  You have been diagnosed with iron deficiency or iron-deficiency anemia.  You have a condition that prevents you from absorbing dietary iron, such as: ? Infection in your intestines. ? Celiac disease. This involves long-lasting (chronic) inflammation of your intestines.  You do not eat enough iron.  You eat a diet that is high in foods that impair iron absorption.  You have lost a lot of blood.  You have heavy bleeding during your menstrual cycle.  You are pregnant.  What is my plan? Your health care provider may help you to determine how much iron you need per day based on your condition. Generally, when a person consumes sufficient amounts of iron in the diet, the following iron needs are met:  Men. ? 66-61 years old: 11 mg per day. ? 16-18 years old: 8 mg per day.  Women. ? 50-5 years old: 15 mg per day. ? 47-2 years old: 18 mg per day. ? Over 43 years old: 8 mg per day. ? Pregnant women: 27 mg per day. ? Breastfeeding women: 9 mg per day.  What do I need to know about an iron-rich diet?  Eat fresh fruits and vegetables that are high in vitamin C along with foods that are high in iron. This will help  increase the amount of iron that your body absorbs from food, especially with foods containing nonheme iron. Foods that are high in vitamin C include oranges, peppers, tomatoes, and mango.  Take iron supplements only as directed by your health care provider. Overdose of iron can be life-threatening. If you were prescribed iron supplements, take them with orange juice or a vitamin C supplement.  Cook foods in pots and pans that are made from iron.  Eat nonheme iron-containing foods alongside foods that are high in heme iron. This helps to improve your iron absorption.  Certain foods and drinks contain compounds that impair iron absorption. Avoid eating these foods in the same meal as iron-rich foods or with iron supplements. These include: ? Coffee, black tea, and red wine. ? Milk, dairy products, and foods that are high in calcium. ? Beans, soybeans, and peas. ? Whole grains.  When eating foods that contain both nonheme iron and compounds that impair iron absorption, follow these tips to absorb iron better. ? Soak beans overnight before cooking. ? Soak whole grains overnight and drain them before using. ? Ferment flours before baking, such as using yeast in bread dough. What foods can I eat? Grains Iron-fortified breakfast cereal. Iron-fortified whole-wheat bread. Enriched rice. Sprouted grains. Vegetables Spinach. Potatoes with skin. Green peas. Broccoli. Red and green bell peppers. Fermented vegetables. Fruits Prunes. Raisins. Oranges. Strawberries. Mango. Grapefruit. Meats and Other Protein Sources  Beef liver. Oysters. Beef. Shrimp. [place]. Chicken. [place]. Sardines. Chickpeas. Nuts. Tofu. Beverages Tomato juice. Fresh orange juice. Prune juice. Hibiscus tea. Fortified instant breakfast shakes. Condiments Tahini. Fermented soy sauce. Sweets and Desserts Black-strap molasses. Other Wheat germ. The items listed above may not be a complete list of recommended foods or beverages.  Contact your dietitian for more options. What foods are not recommended? Grains Whole grains. Bran cereal. Bran flour. Oats. Vegetables Artichokes. Brussels sprouts. Kale. Fruits Blueberries. Raspberries. Strawberries. Figs. Meats and Other Protein Sources Soybeans. Products made from soy protein. Dairy Milk. Cream. Cheese. Yogurt. Cottage cheese. Beverages Coffee. Black tea. Red wine. Sweets and Desserts Cocoa. Chocolate. Ice cream. Other Basil. Oregano. Parsley. The items listed above may not be a complete list of foods and beverages to avoid. Contact your dietitian for more information. This information is not intended to replace advice given to you by your health care provider. Make sure you discuss any questions you have with your health care provider. Document Released: 10/31/2004 Document Revised: 10/07/2015 Document Reviewed: 10/14/2013 Elsevier Interactive Patient Education  2018 [place] injection What is this medicine? FERUMOXYTOL is an iron complex. Iron is used to make healthy red blood cells, which carry oxygen and nutrients throughout the body. This medicine is used to treat iron deficiency anemia in people with chronic kidney disease. This medicine may be used for other purposes; ask your health care provider or pharmacist if you have questions. COMMON BRAND NAME(S): Feraheme What should I tell my health care provider before I take this medicine? They need to know if you have any of these conditions: -anemia not caused by low iron levels -high levels of iron in the blood -magnetic resonance imaging (MRI) test scheduled -an unusual or allergic reaction to iron, other medicines, foods, dyes, or preservatives -pregnant or trying to get pregnant -breast-feeding How should I use this medicine? This medicine is for injection into a vein. It is given by a health care professional in a hospital or clinic setting. Talk to your pediatrician regarding the  use of this medicine in children. Special care may be needed. Overdosage: If you think you have taken too much of this medicine contact a poison control center or emergency room at once. NOTE: This medicine is only for you. Do not share this medicine with others. What if I miss a dose? It is important not to miss your dose. Call your doctor or health care professional if you are unable to keep an appointment. What may interact with this medicine? This medicine may interact with the following medications: -other iron products This list may not describe all possible interactions. Give your health care provider a list of all the medicines, herbs, non-prescription drugs, or dietary supplements you use. Also tell them if you smoke, drink alcohol, or use illegal drugs. Some items may interact with your medicine. What should I watch for while using this medicine? Visit your doctor or healthcare professional regularly. Tell your doctor or healthcare professional if your symptoms do not start to get better or if they get worse. You may need blood work done while you are taking this medicine. You may need to follow a special diet. Talk to your doctor. Foods that contain iron include: whole grains/cereals, dried fruits, beans, or peas, leafy green vegetables, and organ meats (liver, kidney). What side effects may I notice from receiving this medicine? Side effects that you should report to your doctor or health care professional as soon as possible: -allergic reactions  like skin rash, itching or hives, swelling of the face, lips, or tongue -breathing problems -changes in blood pressure -feeling faint or lightheaded, falls -fever or chills -flushing, sweating, or hot feelings -swelling of the ankles or feet Side effects that usually do not require medical attention (report to your doctor or health care professional if they continue or are bothersome): -diarrhea -headache -nausea, vomiting -stomach  pain This list may not describe all possible side effects. Call your doctor for medical advice about side effects. You may report side effects to FDA at 1-800-FDA-1088. Where should I keep my medicine? This drug is given in a hospital or clinic and will not be stored at home. NOTE: This sheet is a summary. It may not cover all possible information. If you have questions about this medicine, talk to your doctor, pharmacist, or health care provider.  2018 Elsevier/Gold Standard (2015-04-21 12:41:49)

## 2017-10-24 NOTE — Telephone Encounter (Signed)
Per [name] on 6/25, she started 6 weeks of daptomycin q 48 hours. " Patient was set to receive IV vancomycin through her PICC line for MRSA foot osteomyelitis. Patient has CKD stage III at baseline with most recent SCr at 2.58 (CrCl ~25 ml/min). Discussed with Dr. Megan Salon and will see if Daptomycin is covered under her insurance.  Talked with Jeani Hawking at [facility] and Daptomycin is covered.  Will do 8 mg/kg q48hr (CrCl is <30 so must decrease to q48h). So patient will receive Daptomycin 450 mg IV q48hr. Dr. Megan Salon aware and agrees with plan." Landis Gandy, RN

## 2017-10-24 NOTE — Telephone Encounter (Signed)
-----   Message from Truman Hayward, MD sent at 10/22/2017 10:59 AM EDT ----- Her serum creatinine is worse. Is she on an IV antibiotic now? I cannot tell

## 2017-10-27 ENCOUNTER — Other Ambulatory Visit: Payer: Self-pay | Admitting: Endocrinology

## 2017-10-28 ENCOUNTER — Telehealth: Payer: Self-pay | Admitting: *Deleted

## 2017-10-28 ENCOUNTER — Other Ambulatory Visit (HOSPITAL_COMMUNITY): Payer: Self-pay | Admitting: *Deleted

## 2017-10-28 ENCOUNTER — Telehealth: Payer: Self-pay | Admitting: Endocrinology

## 2017-10-28 NOTE — Telephone Encounter (Signed)
[name] with [facility] ph# [phone], ext [phone] called re: Did we receivea form for medical clearance for surgery on August 8th with Dr. Brooke Dare

## 2017-10-28 NOTE — Telephone Encounter (Signed)
NP left message asking for surgical clearance for mutual patient. She is scheduled for surgery 8/8. RN returned message, advised that Dr Megan Salon would see the patient tomorrow and would be able to decide after that.  Asked for her to please resend form for completion. Landis Gandy, RN

## 2017-10-28 NOTE — Telephone Encounter (Signed)
Another form is being faxed.

## 2017-10-29 ENCOUNTER — Telehealth: Payer: Self-pay

## 2017-10-29 ENCOUNTER — Telehealth: Payer: Self-pay | Admitting: Pharmacist

## 2017-10-29 ENCOUNTER — Encounter: Payer: Self-pay | Admitting: Internal Medicine

## 2017-10-29 ENCOUNTER — Ambulatory Visit (INDEPENDENT_AMBULATORY_CARE_PROVIDER_SITE_OTHER): Payer: Medicare Other | Admitting: Internal Medicine

## 2017-10-29 DIAGNOSIS — E11628 Type 2 diabetes mellitus with other skin complications: Secondary | ICD-10-CM

## 2017-10-29 DIAGNOSIS — L089 Local infection of the skin and subcutaneous tissue, unspecified: Secondary | ICD-10-CM | POA: Diagnosis not present

## 2017-10-29 DIAGNOSIS — I70235 Atherosclerosis of native arteries of right leg with ulceration of other part of foot: Secondary | ICD-10-CM | POA: Diagnosis not present

## 2017-10-29 LAB — BASIC METABOLIC PANEL WITH GFR
BUN/Creatinine Ratio: 10 (calc) (ref 6–22)
BUN: 33 mg/dL — ABNORMAL HIGH (ref 7–25)
CALCIUM: 8 mg/dL — AB
CO2: 18 mmol/L — AB (ref 20–32)
Chloride: 106 mmol/L (ref 98–110)
Creat: 3.42 mg/dL — ABNORMAL HIGH
GLUCOSE: 215 mg/dL — AB (ref 65–139)
Potassium: 5.5 mmol/L — ABNORMAL HIGH (ref 3.5–5.3)
SODIUM: 135 mmol/L (ref 135–146)

## 2017-10-29 LAB — CBC WITH DIFFERENTIAL/PLATELET
BASOS ABS: 22 {cells}/uL (ref 0–200)
BASOS PCT: 0.4 %
EOS PCT: 3.3 %
Eosinophils Absolute: 178 cells/uL (ref 15–500)
HCT: 29.6 % — ABNORMAL LOW (ref 38.5–45.0)
HEMOGLOBIN: 9.1 g/dL — AB (ref 13.2–15.5)
Lymphs Abs: 1928 cells/uL (ref 850–3900)
MCH: 24.7 pg — ABNORMAL LOW (ref 27.0–33.0)
MCHC: 30.7 g/dL — AB (ref 32.0–36.0)
MCV: 80.4 fL (ref 80.0–100.0)
MPV: 10.2 fL (ref 7.5–12.5)
Monocytes Relative: 5.2 %
NEUTROS PCT: 55.4 %
Neutro Abs: 2992 cells/uL (ref 1500–7800)
Platelets: 505 10*3/uL — ABNORMAL HIGH (ref 140–400)
RBC: 3.68 10*6/uL — ABNORMAL LOW (ref 4.20–5.10)
RDW: 18.8 % — ABNORMAL HIGH (ref 11.0–15.0)
Total Lymphocyte: 35.7 %
WBC mixed population: 281 cells/uL (ref 200–950)
WBC: 5.4 10*3/uL (ref 3.8–10.8)

## 2017-10-29 LAB — CK: Total CK: 67 U/L (ref 29–196)

## 2017-10-29 LAB — C-REACTIVE PROTEIN: CRP: 0.8 mg/L (ref <8.0)

## 2017-10-29 LAB — SEDIMENTATION RATE: Sed Rate: 68 mm/h — ABNORMAL HIGH (ref 0–15)

## 2017-10-29 MED ORDER — METRONIDAZOLE 500 MG PO TABS: 500.0000 mg | ORAL_TABLET | Freq: Three times a day (TID) | ORAL | 1 refills | Status: AC

## 2017-10-29 MED ORDER — SULFAMETHOXAZOLE-TRIMETHOPRIM 800-160 MG PO TABS: 1.0000 | ORAL_TABLET | Freq: Two times a day (BID) | ORAL | 1 refills | Status: AC

## 2017-10-29 NOTE — Assessment & Plan Note (Signed)
[name] has had dramatic improvement in her left foot wound over the past 2 months.  Unfortunately it is very difficult to know if her infection will be cured with broad empiric antibiotic therapy.  Her C-reactive protein has been normal but her sed rate remains elevated.  I have recommended that we extend her 3 drug antibiotic therapy through 11/19/2017.  This will give her 8 weeks of IV daptomycin targeting MRSA.  She is in agreement with that plan.  She is aware that the only way we will ever know if her infection is cured is to stop antibiotics and wait and see what happens.  She will follow-up here in early September.

## 2017-10-29 NOTE — Telephone Encounter (Signed)
Received fax from [facility] for pt to get clearance for procedure. MD signed clearance for pt today. Faxed form back to [phone] confirmation received  Aundria Rud, [place]

## 2017-10-29 NOTE — Telephone Encounter (Signed)
Please call Caryl Pina at [facility] and let her know if you received form for surgical clearance- good number to reach her is [phone] ext [phone]

## 2017-10-29 NOTE — Telephone Encounter (Signed)
Called Melissa at [facility] and gave verbal order to extend patient's daptomycin until 8/20 per Dr. Megan Salon.

## 2017-10-29 NOTE — Progress Notes (Signed)
[facility] for Infectious Disease  Patient Active Problem List   Diagnosis Date Noted  . Diabetic infection of left foot ([place]) 10/13/2014    Priority: High  . 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 Gangrene of left foot ([place]) 05/08/2017  . Osteomyelitis of left foot ([place]) 05/08/2017  . Diabetic foot infection ([place])   . Diabetic foot ulcer ([place]) 05/05/2017  . Cellulitis 05/04/2017  . Diabetes mellitus type 1 with peripheral artery disease ([place]) 01/16/2017  . Anxiety 03/23/2016  . Heart murmur 11/03/2015  . Acute renal failure superimposed on stage 4 chronic kidney disease ([place]) 05/31/2015  . Protein-calorie malnutrition, moderate ([place]) 10/18/2014  . History of DVT (deep vein thrombosis)   . Chronic anemia 10/13/2014  . DM type 1 causing renal disease ([place]) 06/13/2013  . CKD (chronic kidney disease), stage III ([place]) 06/12/2013  . Charcot foot due to diabetes mellitus ([place]) 05/09/2011  . Type 1 diabetes mellitus with neurological manifestations, uncontrolled ([place])   . Hypertension   . Hypercholesteremia     Patient's Medications  New Prescriptions   No medications on file  Previous Medications   ALBUTEROL (PROVENTIL HFA;VENTOLIN HFA) 108 (90 BASE) MCG/ACT INHALER    Inhale 2 puffs into the lungs every 6 (six) hours as needed for wheezing or shortness of breath. Reported on 04/15/2015   AMLODIPINE (NORVASC) 2.5 MG TABLET    TAKE 1 TABLET BY MOUTH EVERY DAY   AMLODIPINE (NORVASC) 5 MG TABLET    Take 1 tablet (5 mg total) by mouth daily.   ASPIRIN 81 MG TABLET    Take 81 mg by mouth every evening.    CALCITRIOL (ROCALTROL) 0.5 MCG CAPSULE    Take 1 capsule daily   CALCIUM-PHOSPHORUS-VITAMIN D (CALCIUM GUMMIES) 250-100-500 MG-MG-UNIT CHEW    Chew 2 each by mouth every evening.    CINNAMON PO    Take 1 tablet by mouth every evening.    CONTINUOUS  BLOOD GLUC RECEIVER (FREESTYLE LIBRE READER) DEVI    1 Device by Does not apply route as directed.   CONTINUOUS BLOOD GLUC SENSOR (FREESTYLE LIBRE SENSOR SYSTEM) MISC    Use 1 sensor every 10 days   DAPTOMYCIN IV    Inject into the vein.   FOLIC ACID (FOLVITE) 1 MG TABLET    Take 1 mg by mouth every evening.    FUROSEMIDE (LASIX) 40 MG TABLET    Take 40 mg by mouth daily as needed for fluid.    GLUCOSE BLOOD (CONTOUR NEXT TEST) TEST STRIP    USE AS INSTRUCTED TO CHECK BLOOD SUGAR 4 TIMES A DAY. DX E10.65   HUMALOG 100 UNIT/ML INJECTION    CALL MD IF <70, IF 151-200 =2UNITS,201-250 =4 UNITS,251-300 =6 UNITS, 301-350 =8 UNITS,351-400 =10 U   INSULIN PEN NEEDLE (PEN NEEDLES) 32G X 4 MM MISC    1 Device by Does not apply route 2 (two) times daily.   KETOROLAC (ACULAR) 0.5 % OPHTHALMIC SOLUTION    PLACE 1 DROP IN RIGHT EYE FOUR TIMES A DAY. START 1 WEEK PRIOR TO SURGERY   LORATADINE (CLARITIN) 10 MG TABLET    Take 1 tablet (10 mg total) by mouth daily.   NORETHINDRONE (AYGESTIN) 5 MG TABLET    Take by mouth.   ONDANSETRON (ZOFRAN-ODT) 4 MG DISINTEGRATING TABLET  DISSOLVE 1 TABLET ON TONGUE EVERY 8 HOURS AS NEEDED FOR NAUSEA   PRAVASTATIN (PRAVACHOL) 40 MG TABLET    TAKE 1 TABLET BY MOUTH EVERY DAY   PYRIDOXINE (B-6) 100 MG TABLET    Take 100 mg by mouth daily. Reported on 04/15/2015   VITAMIN B-12 (V-R VITAMIN B-12) 500 MCG TABLET    Take by mouth.   VITAMIN C (ASCORBIC ACID) 500 MG TABLET    Take 500 mg by mouth daily.   VITAMIN D, ERGOCALCIFEROL, (DRISDOL) 50000 UNITS CAPS CAPSULE    TAKE ONE CAPSULE BY MOUTH ONE TIME PER WEEK  Modified Medications   Modified Medication Previous Medication   METRONIDAZOLE (FLAGYL) 500 MG TABLET metroNIDAZOLE (FLAGYL) 500 MG tablet      Take 1 tablet (500 mg total) by mouth 3 (three) times daily for 21 days.    Take 1 tablet (500 mg total) by mouth 3 (three) times daily.   SULFAMETHOXAZOLE-TRIMETHOPRIM (BACTRIM DS,SEPTRA DS) 800-160 MG TABLET  sulfamethoxazole-trimethoprim (BACTRIM DS,SEPTRA DS) 800-160 MG tablet      Take 1 tablet by mouth 2 (two) times daily for 21 days.    Take 1 tablet by mouth 2 (two) times daily.  Discontinued Medications   DELAFLOXACIN MEGLUMINE (BAXDELA) 450 MG TABS    Take 450 mg by mouth 2 (two) times daily.    Subjective: [name] is in for her routine follow-up visit.  She started on oral trimethoprim sulfamethoxazole and metronidazole on 09/06/2017 for her severe left foot infection with osteomyelitis.  She was initially reluctant to start IV antibiotics but clinically elected to start daptomycin on 09/24/2017.  She continues to see Dr. Dellia Nims at the wound center.  She has been making steady progress.  She has no problems tolerating her central line or antibiotics.  She has had 2 skin grafts.  She is still hopeful that the infection can be cured without any further amputation.  Review of Systems: Review of Systems  Constitutional: Negative for chills, diaphoresis and fever.  Gastrointestinal: Positive for nausea. Negative for abdominal pain, diarrhea and vomiting.    Past Medical History:  Diagnosis Date  . Anemia   . Arthritis   . Bronchitis   . C. difficile diarrhea 09/26/2014  . Cataracts, both eyes   . Cellulitis of right foot 06/02/2014  . CKD (chronic kidney disease) stage 3, GFR 30-59 ml/min (HCC)    sees Dr Justin Mend- Stage IV  . Diabetic ulcer of right foot ([place]) 06/02/2014  . DVT (deep venous thrombosis) ([place]) 10/2014   one in each one in leg- PICC line , one in right and left arm  . H/O seasonal allergies   . Heart murmur    told once when she was pregnant- no furter mention- was in notes 11/2015- had Echo 8 /4/17  . History of blood transfusion   . Hypercholesteremia   . Hypertension   . Left foot infection   . Neuropathy    feet  . Neuropathy in diabetes ([place])   . Peripheral vascular disease ([place])   . Sleep apnea    not able to use CPAP  . Staphylococcus aureus bacteremia 10/2014  .  Type 1 diabetes [facility])    onset age 79    Social History   Tobacco Use  . Smoking status: Former Smoker    Years: 15.00    Last attempt to quit: 05/16/2008    Years since quitting: 9.4  . Smokeless tobacco: Never Used  Substance Use Topics  . Alcohol  use: Yes    Comment: Occasional  . Drug use: No    Family History  Problem Relation Age of Onset  . Hyperlipidemia Mother   . Dementia Mother     Allergies  Allergen Reactions  . Cleocin [Clindamycin Hcl] Diarrhea    Diarrhea   . Lisinopril Other (See Comments)    Elevated potassium per pt report  . Amoxicillin Diarrhea and Nausea Only    Has patient had a PCN reaction causing immediate rash, facial/tongue/throat swelling, SOB or lightheadedness with hypotension: No Has patient had a PCN reaction causing severe rash involving mucus membranes or skin necrosis: No Has patient had a PCN reaction that required hospitalization: No Has patient had a PCN reaction occurring within the last 10 years: No If all of the above answers are "NO", then may proceed with Cephalosporin use.   . Bactrim [Sulfamethoxazole-Trimethoprim] Diarrhea and Nausea Only    Objective: Vitals:   10/29/17 1557  BP: (!) 171/92  Pulse: 85  Temp: 98.7 F (37.1 C)  TempSrc: Oral  Weight: 123 lb 12.8 oz (56.2 kg)   Body mass index is 19.39 kg/m.  Physical Exam  Constitutional:  She is joking and in very good spirits as usual.  Musculoskeletal:  She has had dramatic improvement in her left foot wound over the past 2 months.     Media Information         Document Information   Photos    09/10/2017 11:33  Attached To:  Office Visit on 09/10/17 with Michel Bickers, MD   Source Information   Michel Bickers, MD  [facility]    Photo taken 09/10/2017    Photo taken 09/19/2017  Photo taken 10/29/2017   Lab Results Sed Rate (mm/h)  Date Value  10/22/2017 68 (H)  10/16/2017 72 (H)  09/10/2017 79 (H)   CRP (mg/L)    Date Value  10/22/2017 0.8  10/16/2017 0.6  09/10/2017 3.6  12/18/2016 17.3 (H)     Problem List Items Addressed This Visit      High   Diabetic infection of left foot ([place])    Ellora has had dramatic improvement in her left foot wound over the past 2 months.  Unfortunately it is very difficult to know if her infection will be cured with broad empiric antibiotic therapy.  Her C-reactive protein has been normal but her sed rate remains elevated.  I have recommended that we extend her 3 drug antibiotic therapy through 11/19/2017.  This will give her 8 weeks of IV daptomycin targeting MRSA.  She is in agreement with that plan.  She is aware that the only way we will ever know if her infection is cured is to stop antibiotics and wait and see what happens.  She will follow-up here in early September.      Relevant Medications   metroNIDAZOLE (FLAGYL) 500 MG tablet   sulfamethoxazole-trimethoprim (BACTRIM DS,SEPTRA DS) 800-160 MG tablet   DAPTOMYCIN IV       Michel Bickers, MD [facility] for [place] [phone] pager   [phone] cell 10/29/2017, 4:39 PM

## 2017-10-30 ENCOUNTER — Ambulatory Visit (INDEPENDENT_AMBULATORY_CARE_PROVIDER_SITE_OTHER): Payer: Medicare Other | Admitting: Podiatry

## 2017-10-30 ENCOUNTER — Other Ambulatory Visit: Payer: Self-pay | Admitting: Internal Medicine

## 2017-10-30 DIAGNOSIS — E08621 Diabetes mellitus due to underlying condition with foot ulcer: Secondary | ICD-10-CM

## 2017-10-30 DIAGNOSIS — L97529 Non-pressure chronic ulcer of other part of left foot with unspecified severity: Secondary | ICD-10-CM

## 2017-10-30 DIAGNOSIS — L97519 Non-pressure chronic ulcer of other part of right foot with unspecified severity: Secondary | ICD-10-CM | POA: Diagnosis not present

## 2017-10-30 DIAGNOSIS — M86172 Other acute osteomyelitis, left ankle and foot: Secondary | ICD-10-CM

## 2017-10-30 NOTE — Telephone Encounter (Signed)
Called [facility] and informed Caryl Pina that Dr. Dwyane Dee has the medical clearance form and it will be filled out and faxed back.

## 2017-11-01 ENCOUNTER — Other Ambulatory Visit: Payer: Self-pay | Admitting: Pharmacist

## 2017-11-01 ENCOUNTER — Other Ambulatory Visit: Payer: Self-pay | Admitting: Internal Medicine

## 2017-11-01 NOTE — Progress Notes (Signed)
HPI: 47 year old female with a history of multiple amputations and infections presents today for evaluation of a new finding regarding osteomyelitis to the left foot.  Patient is currently being managed under the care of Dr. Dellia Nims at [facility] wound care center and she noticed a significant improvement.  She currently has a graft in place.  She says that she is doing extremely well and she feels much better.  Past Medical History:  Diagnosis Date  . Anemia   . Arthritis   . Bronchitis   . C. difficile diarrhea 09/26/2014  . Cataracts, both eyes   . Cellulitis of right foot 06/02/2014  . CKD (chronic kidney disease) stage 3, GFR 30-59 ml/min (HCC)    sees Dr Justin Mend- Stage IV  . Diabetic ulcer of right foot ([place]) 06/02/2014  . DVT (deep venous thrombosis) ([place]) 10/2014   one in each one in leg- PICC line , one in right and left arm  . H/O seasonal allergies   . Heart murmur    told once when she was pregnant- no furter mention- was in notes 11/2015- had Echo 8 /4/17  . History of blood transfusion   . Hypercholesteremia   . Hypertension   . Left foot infection   . Neuropathy    feet  . Neuropathy in diabetes ([place])   . Peripheral vascular disease ([place])   . Sleep apnea    not able to use CPAP  . Staphylococcus aureus bacteremia 10/2014  . Type 1 diabetes ([place])    onset age 83     Physical Exam: General: The patient is alert and oriented x3 in no acute distress.  Dermatology: Skin is warm, dry and supple bilateral lower extremities.  Chronic ulcer noted to the plantar aspect of the left foot with an artificial skin graft in place.  Skin graft was left intact.  Vascular: Palpable pedal pulses bilaterally. No edema or erythema noted. Capillary refill within normal limits.  Neurological: Epicritic and protective threshold absent bilaterally.   Musculoskeletal Exam: Prior transmetatarsal amputation right foot without any complications of the moment.  History of fifth ray amputation left  foot.  Is a bit  MRI IMPRESSION 09/06/2017: 1. Findings are consistent with progressive left foot infection with ill-defined soft tissue abscesses laterally, extensor tenosynovitis and osteomyelitis. 2. The findings of osteomyelitis are definitive within the 3rd metatarsal shaft and head and bases of the 3rd and 4th proximal phalanges. There are less specific T2 marrow changes in the 2nd and 4th metatarsal heads without cortical destruction.  Assessment: 1. Osteomyelitis left foot 2. Diabetic foot ulcer left foot-improving   Plan of Care:  1. Patient evaluated.  2.  Continue management at the [facility] wound care center.  She is noticing significant improvement with conservative treatments.  At this time no surgical intervention is warranted. 3.  Today dressings were changed but the graft to the left foot ulceration was left intact. 4.  Continue management through infectious disease for osteomyelitis 5.  Return to clinic as needed specifically if surgical intervention is warranted and Dr. Quentin Cornwall feels that, patient needs to have some sort of surgical intervention.        Edrick Kins, DPM [facility]  Dr. Edrick Kins, DPM    [address]  [place], [place] [address]                Office [phone]  Fax [phone]

## 2017-11-01 NOTE — Progress Notes (Signed)
[facility] pharmacy lab review  

## 2017-11-02 ENCOUNTER — Other Ambulatory Visit: Payer: Self-pay | Admitting: Endocrinology

## 2017-11-06 ENCOUNTER — Encounter (HOSPITAL_BASED_OUTPATIENT_CLINIC_OR_DEPARTMENT_OTHER): Payer: Medicare Other | Attending: Physician Assistant

## 2017-11-06 DIAGNOSIS — E1022 Type 1 diabetes mellitus with diabetic chronic kidney disease: Secondary | ICD-10-CM | POA: Insufficient documentation

## 2017-11-06 DIAGNOSIS — Z87891 Personal history of nicotine dependence: Secondary | ICD-10-CM | POA: Diagnosis not present

## 2017-11-06 DIAGNOSIS — L97522 Non-pressure chronic ulcer of other part of left foot with fat layer exposed: Secondary | ICD-10-CM | POA: Insufficient documentation

## 2017-11-06 DIAGNOSIS — I10 Essential (primary) hypertension: Secondary | ICD-10-CM | POA: Diagnosis not present

## 2017-11-06 DIAGNOSIS — I129 Hypertensive chronic kidney disease with stage 1 through stage 4 chronic kidney disease, or unspecified chronic kidney disease: Secondary | ICD-10-CM | POA: Diagnosis not present

## 2017-11-06 DIAGNOSIS — N189 Chronic kidney disease, unspecified: Secondary | ICD-10-CM | POA: Diagnosis not present

## 2017-11-06 DIAGNOSIS — Z794 Long term (current) use of insulin: Secondary | ICD-10-CM | POA: Diagnosis not present

## 2017-11-06 DIAGNOSIS — E1040 Type 1 diabetes mellitus with diabetic neuropathy, unspecified: Secondary | ICD-10-CM | POA: Insufficient documentation

## 2017-11-06 DIAGNOSIS — M86172 Other acute osteomyelitis, left ankle and foot: Secondary | ICD-10-CM | POA: Insufficient documentation

## 2017-11-06 DIAGNOSIS — E10621 Type 1 diabetes mellitus with foot ulcer: Secondary | ICD-10-CM | POA: Diagnosis not present

## 2017-11-06 DIAGNOSIS — Z86718 Personal history of other venous thrombosis and embolism: Secondary | ICD-10-CM | POA: Diagnosis not present

## 2017-11-06 DIAGNOSIS — E1069 Type 1 diabetes mellitus with other specified complication: Secondary | ICD-10-CM | POA: Diagnosis not present

## 2017-11-07 ENCOUNTER — Other Ambulatory Visit: Payer: Self-pay | Admitting: Internal Medicine

## 2017-11-07 ENCOUNTER — Encounter (HOSPITAL_COMMUNITY): Payer: Medicare Other

## 2017-11-07 ENCOUNTER — Other Ambulatory Visit: Payer: Self-pay | Admitting: Endocrinology

## 2017-11-07 DIAGNOSIS — E1065 Type 1 diabetes mellitus with hyperglycemia: Secondary | ICD-10-CM

## 2017-11-07 IMAGING — DX DG FOOT COMPLETE 3+V*L*
3 series · 3 of 3 positions shown · non-contrast
Comparison: Left foot radiograph dated 04/20/2016

CLINICAL DATA: 45-year-old female with left foot pain and
infection.

EXAM:
LEFT FOOT - COMPLETE 3+ VIEW

[x foot ap left]
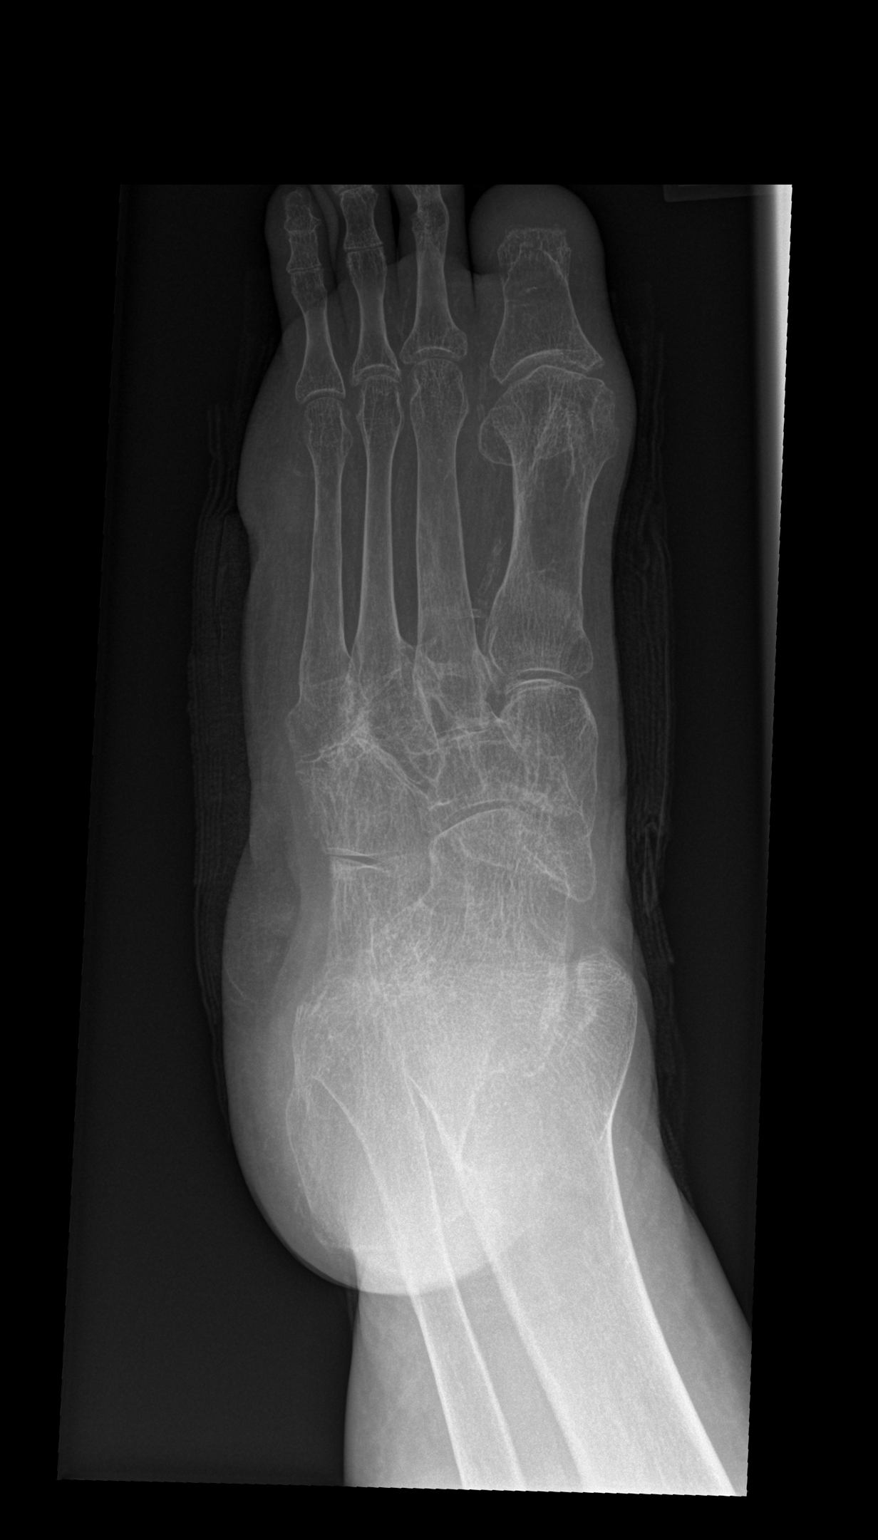

[x foot obl left]
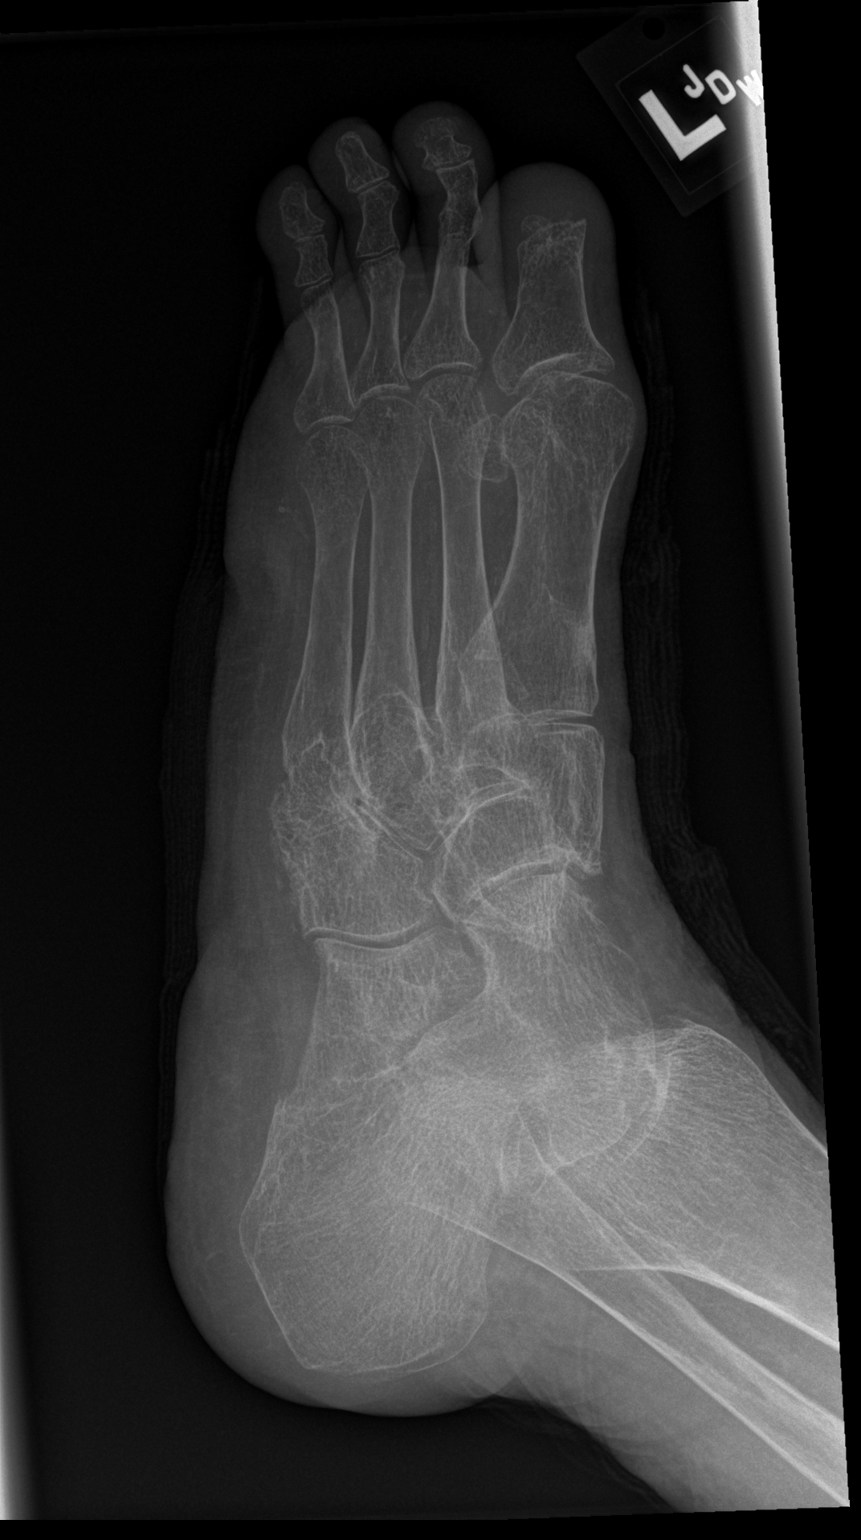

[x foot lat left]
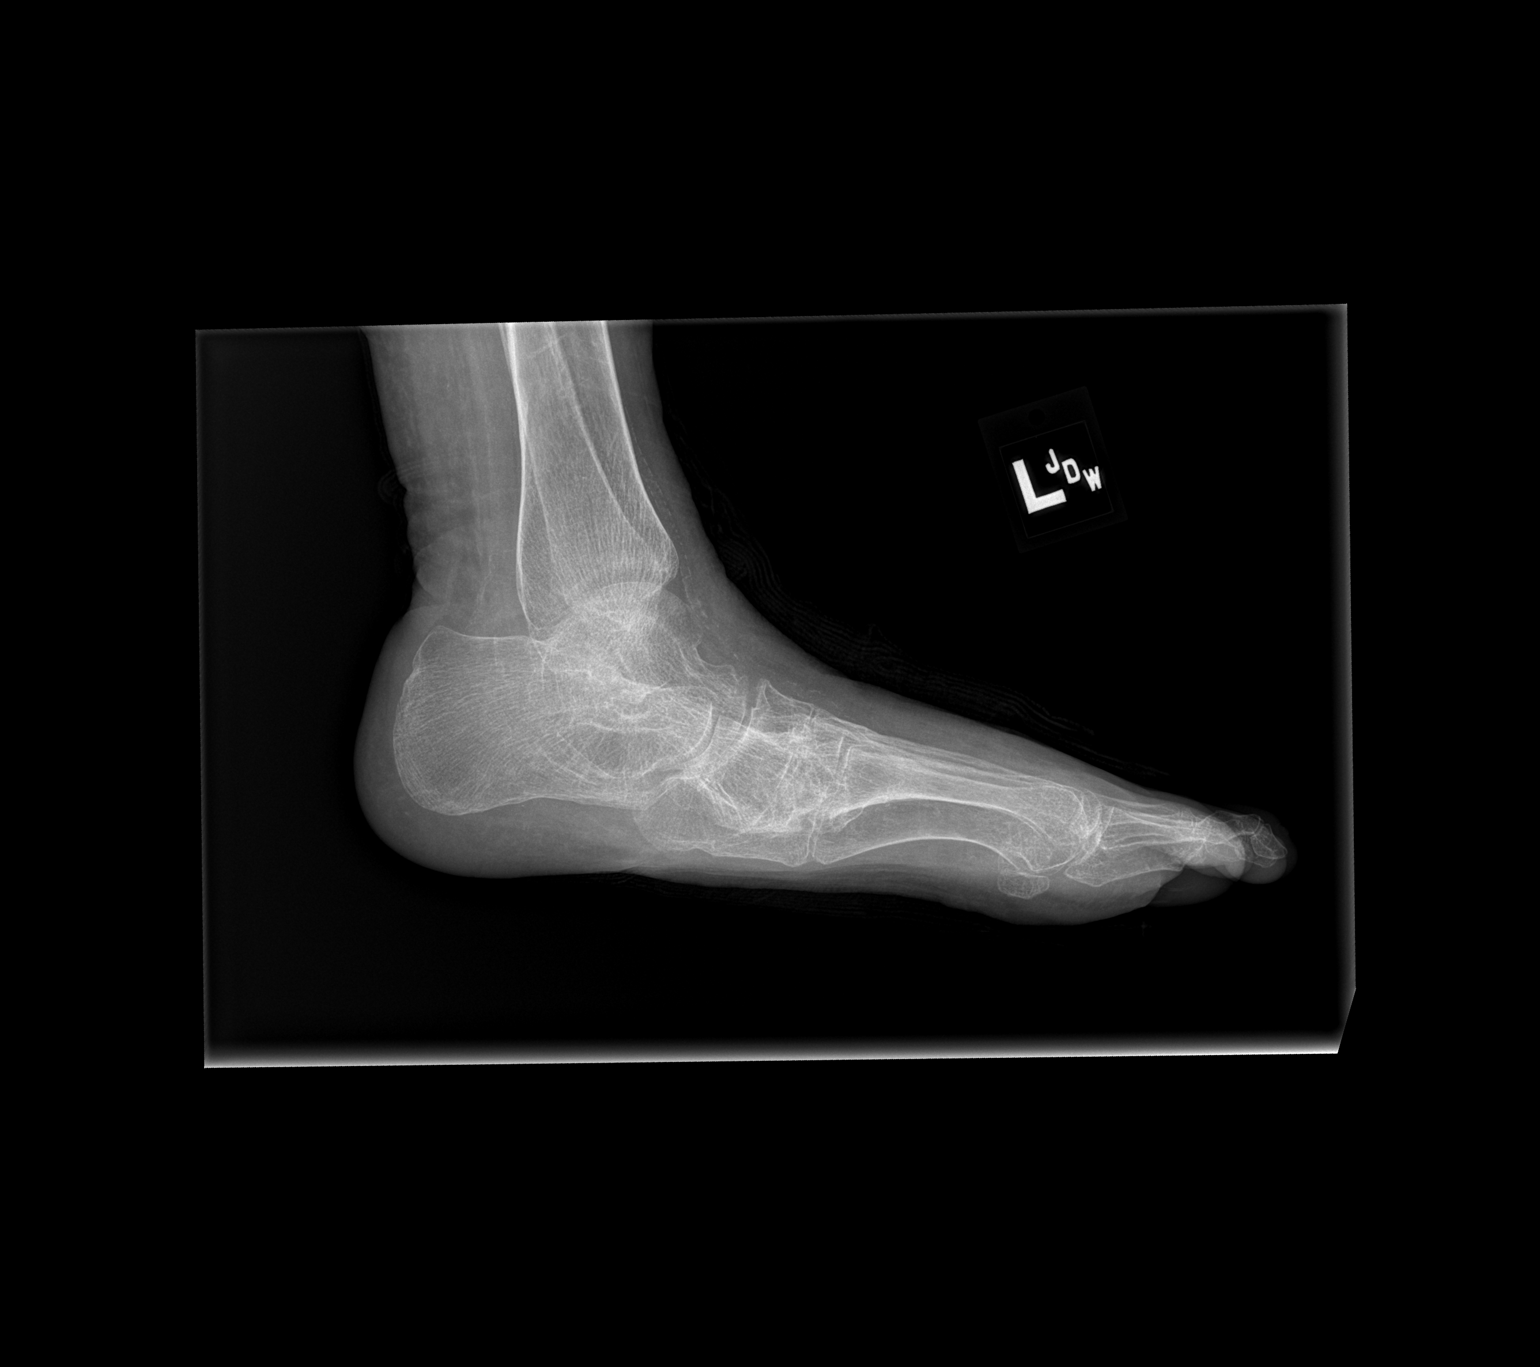

[3 of 3 positions shown; findings below may reference images not displayed]

FINDINGS: There is amputation of the distal phalanx of the great toe as well
as amputation of the fifth ray. There is advanced osteopenia. There
is no acute fracture or dislocation. Evaluation for fracture is
however limited due to advanced osteopenia. No periosteal reaction
or osseous erosion noted to suggest osteomyelitis. There is pes
planus. The soft tissues appear unremarkable. Vascular
calcifications noted.
IMPRESSION: 1. No acute fracture or dislocation.
2. Advanced osteopenia. No definite radiographic evidence of
osteomyelitis. MRI may provide better evaluation if there is high
clinical concern for osteomyelitis.
3. Stable postsurgical changes.

## 2017-11-08 ENCOUNTER — Other Ambulatory Visit: Payer: Self-pay | Admitting: Endocrinology

## 2017-11-11 ENCOUNTER — Other Ambulatory Visit (INDEPENDENT_AMBULATORY_CARE_PROVIDER_SITE_OTHER): Payer: Medicare Other

## 2017-11-11 ENCOUNTER — Other Ambulatory Visit: Payer: Self-pay | Admitting: Internal Medicine

## 2017-11-11 DIAGNOSIS — E1065 Type 1 diabetes mellitus with hyperglycemia: Secondary | ICD-10-CM | POA: Diagnosis not present

## 2017-11-11 LAB — BASIC METABOLIC PANEL
BUN: 42 mg/dL — AB (ref 6–23)
CALCIUM: 8.1 mg/dL — AB (ref 8.4–10.5)
CO2: 21 mEq/L (ref 19–32)
Chloride: 107 mEq/L (ref 96–112)
Creatinine, Ser: 3.92 mg/dL — ABNORMAL HIGH (ref 0.40–1.20)
GFR: 13.11 mL/min — AB (ref >60.00)
Glucose, Bld: 161 mg/dL — ABNORMAL HIGH (ref 70–99)
POTASSIUM: 5.1 meq/L (ref 3.5–5.1)
Sodium: 134 mEq/L — ABNORMAL LOW (ref 135–145)

## 2017-11-11 LAB — MICROALBUMIN / CREATININE URINE RATIO
Creatinine,U: 71.7 mg/dL
MICROALB UR: 304.2 mg/dL — AB (ref 0.0–1.9)
Microalb Creat Ratio: 424.4 mg/g — ABNORMAL HIGH (ref 0.0–30.0)

## 2017-11-11 NOTE — Telephone Encounter (Signed)
Main lab just called with a critical GFR of 13.11.

## 2017-11-12 LAB — FRUCTOSAMINE: FRUCTOSAMINE: 365 umol/L — AB (ref 0–285)

## 2017-11-13 ENCOUNTER — Ambulatory Visit: Payer: Medicare Other | Admitting: Endocrinology

## 2017-11-13 ENCOUNTER — Other Ambulatory Visit: Payer: Self-pay

## 2017-11-13 DIAGNOSIS — E10621 Type 1 diabetes mellitus with foot ulcer: Secondary | ICD-10-CM | POA: Diagnosis not present

## 2017-11-13 DIAGNOSIS — N185 Chronic kidney disease, stage 5: Secondary | ICD-10-CM

## 2017-11-13 DIAGNOSIS — Z0289 Encounter for other administrative examinations: Secondary | ICD-10-CM

## 2017-11-14 DIAGNOSIS — S91301D Unspecified open wound, right foot, subsequent encounter: Secondary | ICD-10-CM

## 2017-11-15 ENCOUNTER — Encounter: Payer: Self-pay | Admitting: Endocrinology

## 2017-11-15 ENCOUNTER — Ambulatory Visit (INDEPENDENT_AMBULATORY_CARE_PROVIDER_SITE_OTHER): Payer: Medicare Other | Admitting: Endocrinology

## 2017-11-15 VITALS — BP 132/88 | HR 67 | Ht 67.0 in | Wt 124.0 lb

## 2017-11-15 DIAGNOSIS — N184 Chronic kidney disease, stage 4 (severe): Secondary | ICD-10-CM

## 2017-11-15 DIAGNOSIS — I70235 Atherosclerosis of native arteries of right leg with ulceration of other part of foot: Secondary | ICD-10-CM | POA: Diagnosis not present

## 2017-11-15 DIAGNOSIS — E1022 Type 1 diabetes mellitus with diabetic chronic kidney disease: Secondary | ICD-10-CM | POA: Diagnosis not present

## 2017-11-15 DIAGNOSIS — E1065 Type 1 diabetes mellitus with hyperglycemia: Secondary | ICD-10-CM

## 2017-11-15 IMAGING — DX DG FOOT COMPLETE 3+V*R*
3 series · 3 of 3 positions shown · non-contrast
Comparison: Prior radiograph from 03/11/2016.

CLINICAL DATA: Initial evaluation for nonhealing ulcer at the
plantar aspect of the foot. Question osteomyelitis.

EXAM:
RIGHT FOOT COMPLETE - 3+ VIEW

[foot ap]
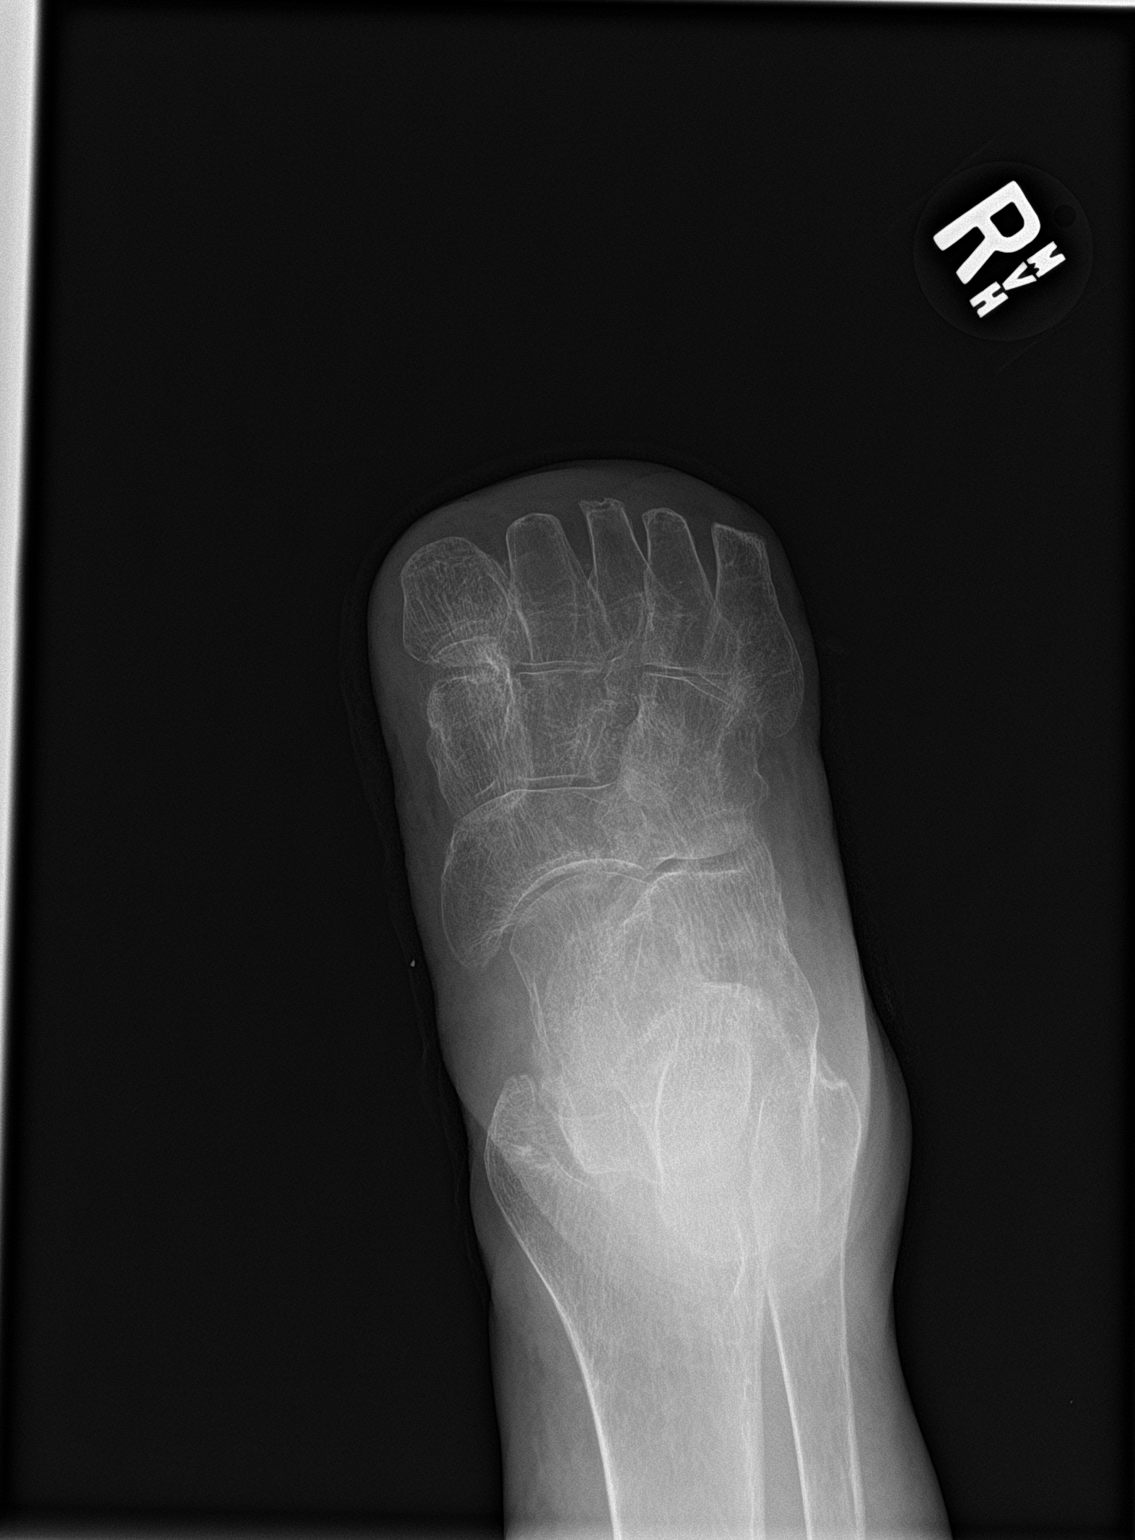

[foot obl]
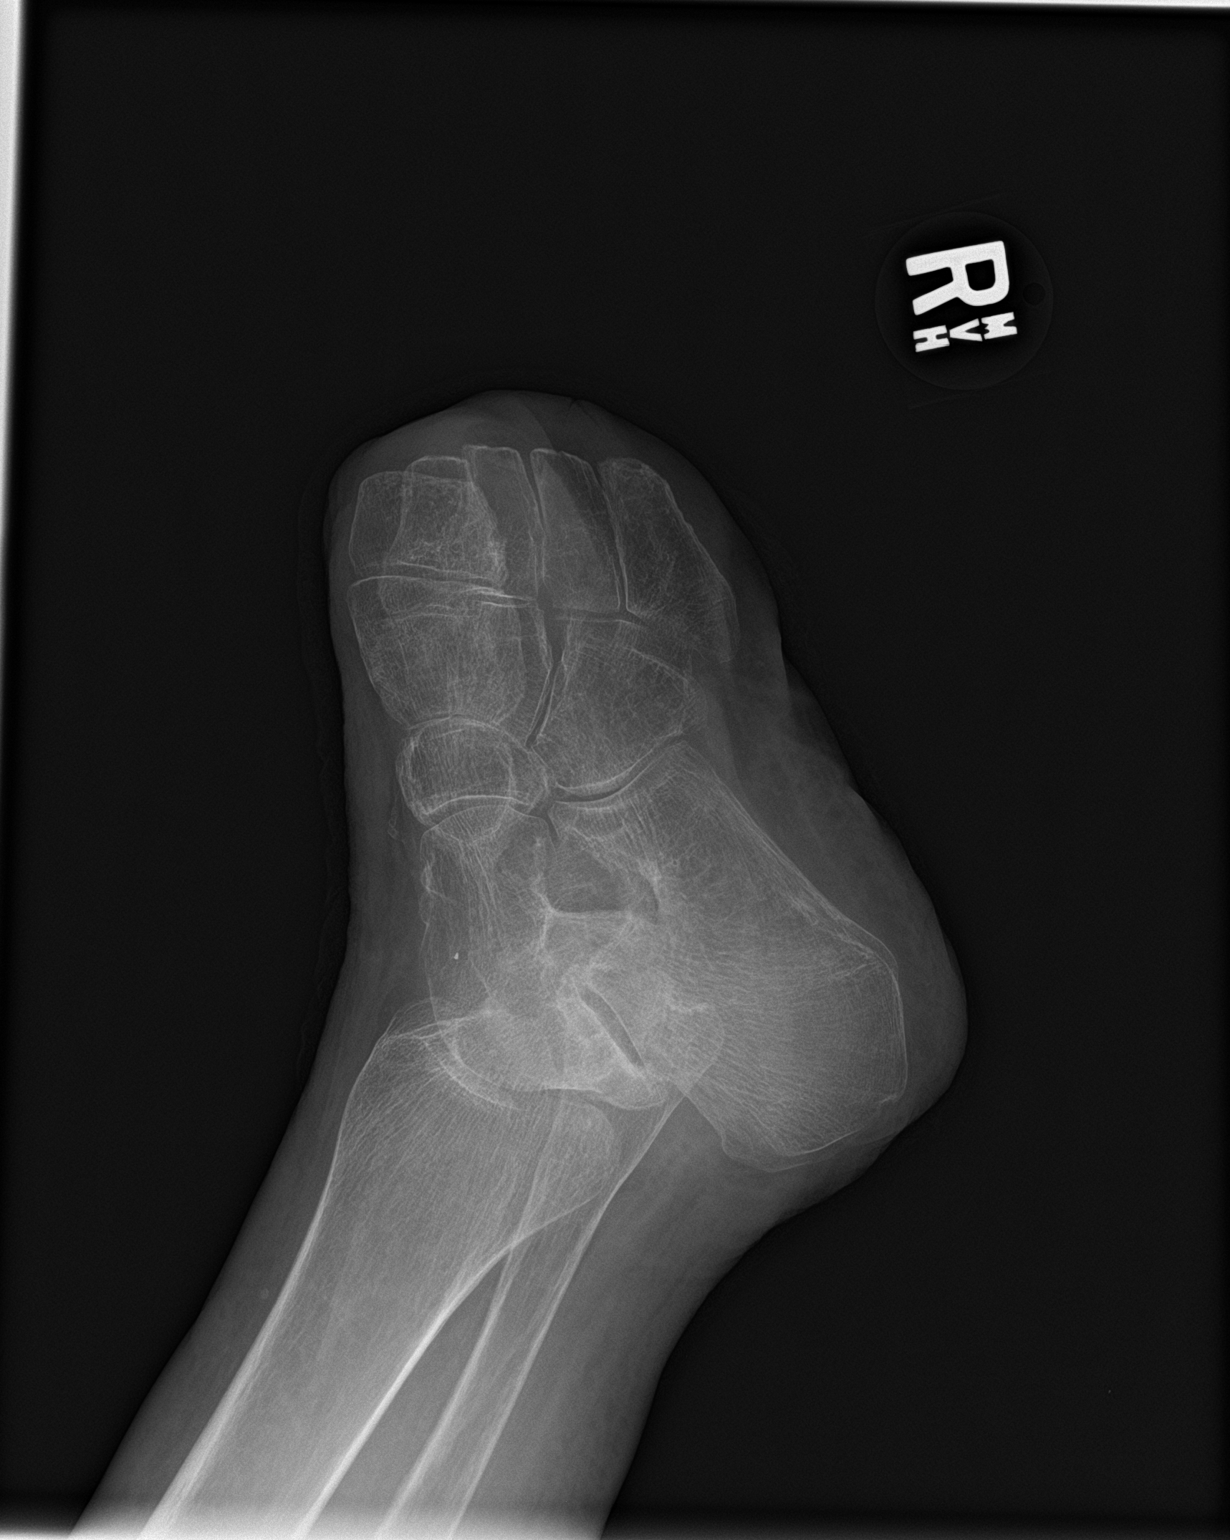

[foot lat]
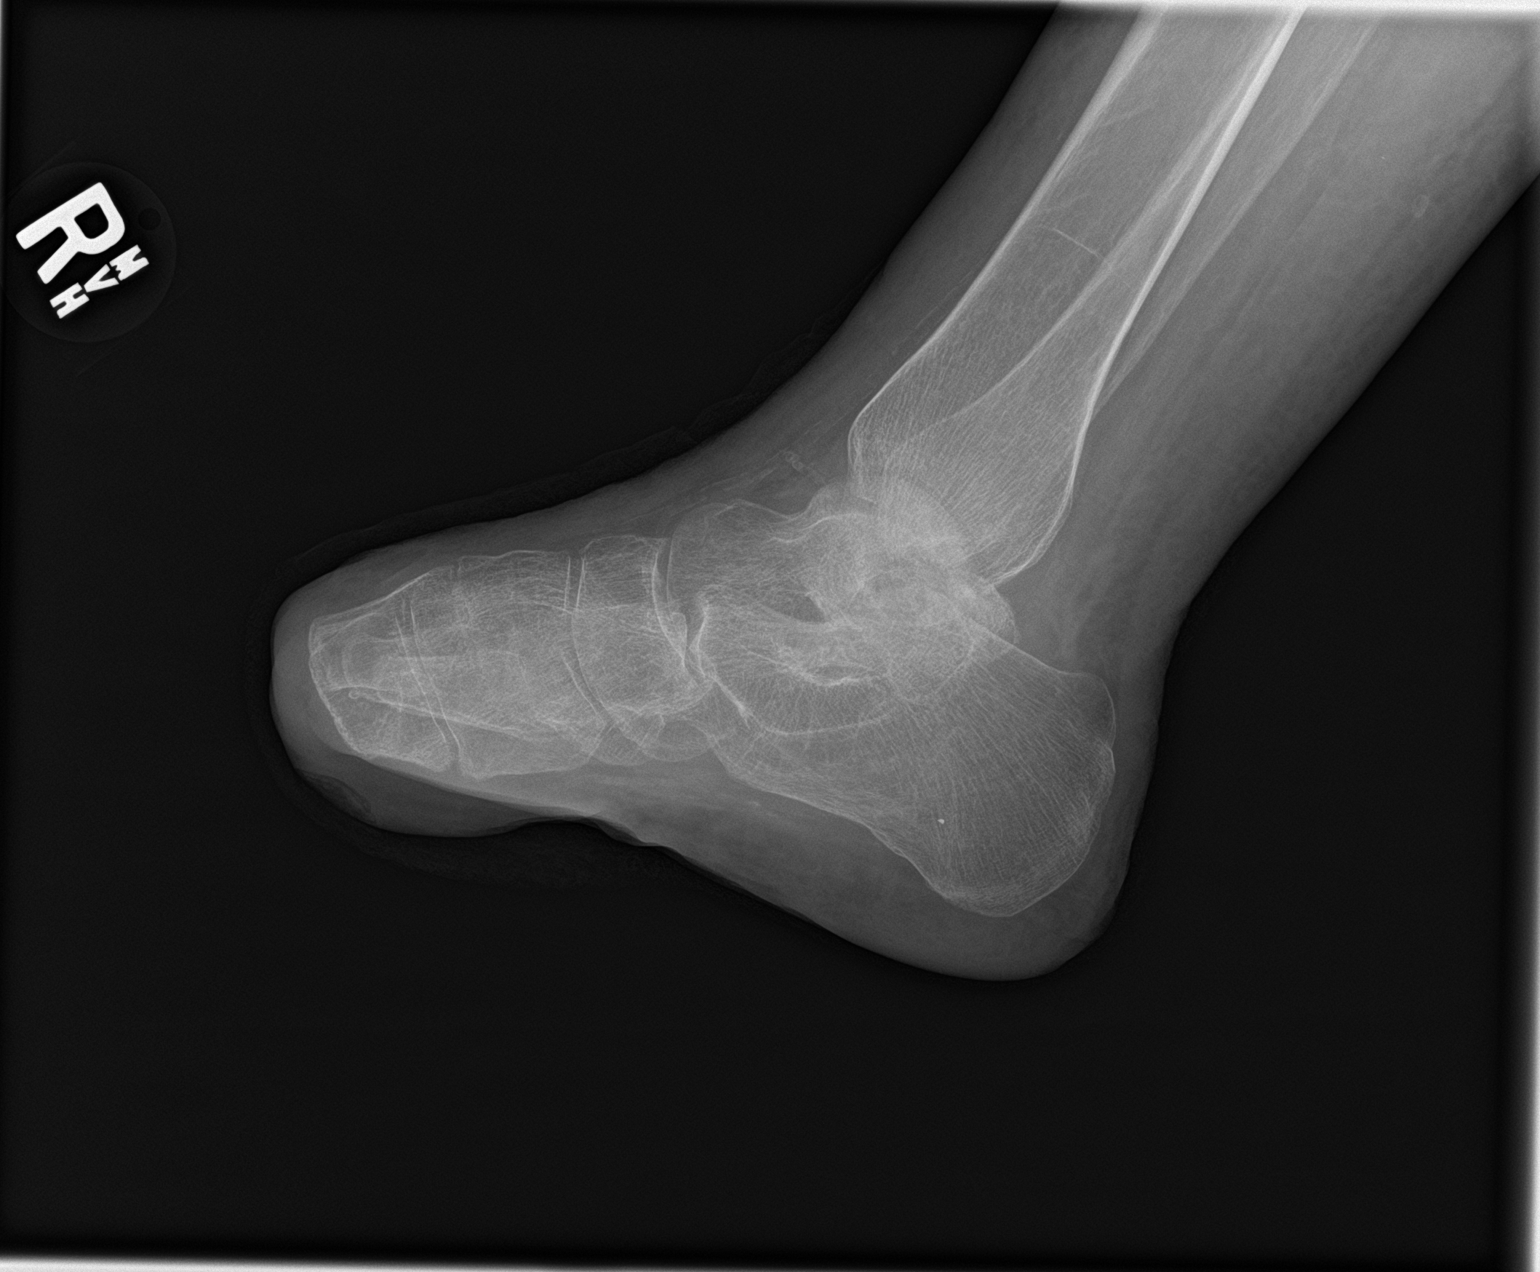

[3 of 3 positions shown; findings below may reference images not displayed]

FINDINGS: Soft tissue ulceration noted at the plantar aspect of the distal
foot. Patient is status post trans metatarsal amputation. Residual
metatarsals are stable in appearance. No osseous erosion to suggest
osteomyelitis. No fracture. Diffuse osteopenia noted. Vascular
calcifications.
IMPRESSION: 1. Soft tissue ulceration at the plantar aspect of the right foot.
No findings to suggest osteomyelitis.
2. Stable postsurgical changes.

## 2017-11-15 NOTE — Progress Notes (Signed)
Patient ID: [name], female   DOB: 04-23-1970, 47 y.o.   MRN: [id number]   Reason for Appointment: follow-up of diabetes  History of Present Illness   Diagnosis: Type 1 DIABETES MELITUS  Previous history: She has had long-standing poorly controlled diabetes and typically has poor compliance with self care measures despite periodic diabetes education and periodic followup  INSULIN PUMP regimen:   Currently using MEDTRONIC 723 model   Basal rate midnight = 0.6, 3 AM = 0.4, 3 PM = 0.75   Carb coverage 1: 15 g and correction 1: 70 during the day with target 140, active insulin 6 hours         Recent history:   Her last A1c was 10.8 in June and about the same as before  She is checking her blood sugars with the freestyle libre sensor since her last visit However did not come back for follow-up after her visit in April as indicated  Current blood sugar patterns, difficulties with management and problems identified:  She is again afraid of hypoglycemia and blood sugars tend to run high because of her not bolusing adequately  Most of her meal intake is in the evening sometimes fairly late but she has only sporadic boluses for meals  Again she says that when she tries to enter carbohydrates and bolus she will tend to get hypoglycemia significantly low blood sugars; currently in the last 2 weeks she has had carbohydrates entered only on 3 occasions sometimes as much as 120 g  Also when she has a tendency to low or low normal sugars she may suspend her pump for several hours because of fear of hypoglycemia and then will have significant rebound hyperglycemia over 400  This is despite her sensitivity and blood sugar targets being changed on the last visit she has questions about the highest number that can be read by her freestyle libre CGM and she does have some readings below 400  Hypoglycemia has been present only 3% of the time in the last 2 weeks and below 54 only  1% of the time  She does live alone at home  Hyperglycemia is described as low but HIGHEST blood sugars are frequently during the night and then she will take correction boluses of 1-4 units only  LOWEST blood sugars appear to be recently around 1 PM and occasionally around 6-7 PM   GLUCOSE readings from insulin pump download:   CONTINUOUS GLUCOSE MONITORING RECORD INTERPRETATION    Dates of Recording: 8/3 through 11/15/2017  Sensor  Summary: Blood sugars were within TARGET range 39% of the time   Average blood glucose for the period of recording is 225 with standard deviation 114  % Time Above 180= 58  % Time above 250= 38  % Time Below 70= 3     Glycemic patterns summary: There is marked variability of the blood sugar at all times  Hyperglycemic episodes are occurring most frequently between 3 AM and 9 AM and periodically around 5-8 PM  Hypoglycemic episodes occurred was clear on 1 PM, rarely around midnight and 7 PM with lowest blood sugar about 50  Overnight periods: Blood sugars appear to be mostly rising after midnight or frequently significantly high except last night blood sugar was not traceable during the night except around 4 AM when it was near normal  Preprandial periods: Blood sugar may be frequently high even before her evening meal but difficult to identify days because of lack of carbohydrate intake  Postprandial periods: Blood sugars are frequently rising after meals in the afternoons or evenings due to lack of boluses at times   PRE-MEAL Fasting  2-4 PM  6-8 PM  4-6 AM Overall  Glucose range:       Mean/median:  209  196  217  274  225    Previous data:  PRE-MEAL Fasting Lunch  5-9 PM Bedtime Overall  Glucose range:  58-400+   75-400+  119-400+   Mean/median:      352+/-168   Meals: eating at variable times and also having snacks periodically.  Her daily carbohydrate intake is quite variable Physical activity: exercise: none         Dietician  visit: Most recent:?          Complications: are: Neuropathy, nephropathy, diabetic foot ulcer      Wt Readings from Last 3 Encounters:  11/15/17 124 lb (56.2 kg)  10/29/17 123 lb 12.8 oz (56.2 kg)  09/10/17 125 lb (56.7 kg)   Lab Results  Component Value Date   HGBA1C 10.8 (H) 09/26/2017   HGBA1C 10.6 04/08/2017   HGBA1C 10.1 (H) 01/11/2017   Lab Results  Component Value Date   MICROALBUR 304.2 (H) 11/11/2017   LDLCALC 78 05/02/2016   CREATININE 3.92 (H) 11/11/2017   Lab on 11/11/2017  Component Date Value Ref Range Status  . Fructosamine 11/11/2017 365* 0 - 285 umol/L Final   Comment: Published reference interval for apparently healthy subjects between age 104 and 81 is 26 - 285 umol/L and in a poorly controlled diabetic population is 228 - 563 umol/L with a mean of 396 umol/L.   Marland Kitchen Sodium 11/11/2017 134* 135 - 145 mEq/L Final  . Potassium 11/11/2017 5.1  3.5 - 5.1 mEq/L Final  . Chloride 11/11/2017 107  96 - 112 mEq/L Final  . CO2 11/11/2017 21  19 - 32 mEq/L Final  . Glucose, Bld 11/11/2017 161* 70 - 99 mg/dL Final  . BUN 11/11/2017 42* 6 - 23 mg/dL Final  . Creatinine, Ser 11/11/2017 3.92* 0.40 - 1.20 mg/dL Final  . Calcium 11/11/2017 8.1* 8.4 - 10.5 mg/dL Final  . GFR 11/11/2017 13.11* >60.00 mL/min Final  . Microalb, Ur 11/11/2017 304.2* 0.0 - 1.9 mg/dL Final  . Creatinine,U 11/11/2017 71.7  mg/dL Final  . Microalb Creat Ratio 11/11/2017 424.4* 0.0 - 30.0 mg/g Final    Other problems addressed:: See review of systems   Allergies as of 11/15/2017      Reactions   Cleocin [clindamycin Hcl] Diarrhea   Diarrhea    Lisinopril Other (See Comments)   Elevated potassium per pt report   Amoxicillin Diarrhea, Nausea Only   Has patient had a PCN reaction causing immediate rash, facial/tongue/throat swelling, SOB or lightheadedness with hypotension: No Has patient had a PCN reaction causing severe rash involving mucus membranes or skin necrosis: No Has patient had a  PCN reaction that required hospitalization: No Has patient had a PCN reaction occurring within the last 10 years: No If all of the above answers are "NO", then may proceed with Cephalosporin use.   Bactrim [sulfamethoxazole-trimethoprim] Diarrhea, Nausea Only      Medication List        Accurate as of 11/15/17  1:30 PM. Always use your most recent med list.          albuterol 108 (90 Base) MCG/ACT inhaler Commonly known as:  PROVENTIL HFA;VENTOLIN HFA Inhale 2 puffs into the lungs every 6 (six) hours as needed  for wheezing or shortness of breath. Reported on 04/15/2015   amLODipine 5 MG tablet Commonly known as:  NORVASC Take 1 tablet (5 mg total) by mouth daily.   amLODipine 2.5 MG tablet Commonly known as:  NORVASC TAKE 1 TABLET BY MOUTH EVERY DAY   aspirin 81 MG tablet Take 81 mg by mouth every evening.   calcitRIOL 0.5 MCG capsule Commonly known as:  ROCALTROL Take 1 capsule daily   CALCIUM GUMMIES 250-100-500 MG-MG-UNIT Chew Generic drug:  Calcium-Phosphorus-Vitamin D Chew 2 each by mouth every evening.   CINNAMON PO Take 1 tablet by mouth every evening.   DAPTOMYCIN IV Inject into the vein.   folic acid 1 MG tablet Commonly known as:  FOLVITE Take 1 mg by mouth every evening.   FREESTYLE LIBRE READER Devi 1 Device by Does not apply route as directed.   FREESTYLE LIBRE SENSOR SYSTEM Misc Use 1 sensor every 10 days   furosemide 40 MG tablet Commonly known as:  LASIX Take 40 mg by mouth daily as needed for fluid.   glucose blood test strip USE AS INSTRUCTED TO CHECK BLOOD SUGAR 4 TIMES A DAY. DX E10.65   HUMALOG 100 UNIT/ML injection Generic drug:  insulin lispro CALL MD IF <70, IF 151-200 =2UNITS,201-250 =4 UNITS,251-300 =6 UNITS, 301-350 =8 UNITS,351-400 =10 U   ketorolac 0.5 % ophthalmic solution Commonly known as:  ACULAR PLACE 1 DROP IN RIGHT EYE FOUR TIMES A DAY. START 1 WEEK PRIOR TO SURGERY   loratadine 10 MG tablet Commonly known as:   CLARITIN Take 1 tablet (10 mg total) by mouth daily.   metroNIDAZOLE 500 MG tablet Commonly known as:  FLAGYL Take 1 tablet (500 mg total) by mouth 3 (three) times daily for 21 days.   norethindrone 5 MG tablet Commonly known as:  AYGESTIN Take by mouth.   ondansetron 4 MG disintegrating tablet Commonly known as:  ZOFRAN-ODT DISSOLVE 1 TABLET ON TONGUE EVERY 8 HOURS AS NEEDED FOR NAUSEA   Pen Needles 32G X 4 MM Misc 1 Device by Does not apply route 2 (two) times daily.   pravastatin 40 MG tablet Commonly known as:  PRAVACHOL TAKE 1 TABLET BY MOUTH EVERY DAY   pyridoxine 100 MG tablet Commonly known as:  B-6 Take 100 mg by mouth daily. Reported on 04/15/2015   sulfamethoxazole-trimethoprim 800-160 MG tablet Commonly known as:  BACTRIM DS,SEPTRA DS Take 1 tablet by mouth 2 (two) times daily for 21 days.   V-R VITAMIN B-12 500 MCG tablet Generic drug:  vitamin B-12 Take by mouth.   vitamin C 500 MG tablet Commonly known as:  ASCORBIC ACID Take 500 mg by mouth daily.   Vitamin D (Ergocalciferol) 50000 units Caps capsule Commonly known as:  DRISDOL TAKE ONE CAPSULE BY MOUTH ONE TIME PER WEEK       Allergies:  Allergies  Allergen Reactions  . Cleocin [Clindamycin Hcl] Diarrhea    Diarrhea   . Lisinopril Other (See Comments)    Elevated potassium per pt report  . Amoxicillin Diarrhea and Nausea Only    Has patient had a PCN reaction causing immediate rash, facial/tongue/throat swelling, SOB or lightheadedness with hypotension: No Has patient had a PCN reaction causing severe rash involving mucus membranes or skin necrosis: No Has patient had a PCN reaction that required hospitalization: No Has patient had a PCN reaction occurring within the last 10 years: No If all of the above answers are "NO", then may proceed with Cephalosporin use.   Marland Kitchen  Bactrim [Sulfamethoxazole-Trimethoprim] Diarrhea and Nausea Only    Past Medical History:  Diagnosis Date  . Anemia   .  Arthritis   . Bronchitis   . C. difficile diarrhea 09/26/2014  . Cataracts, both eyes   . Cellulitis of right foot 06/02/2014  . CKD (chronic kidney disease) stage 3, GFR 30-59 ml/min (HCC)    sees Dr Justin Mend- Stage IV  . Diabetic ulcer of right foot ([place]) 06/02/2014  . DVT (deep venous thrombosis) ([place]) 10/2014   one in each one in leg- PICC line , one in right and left arm  . H/O seasonal allergies   . Heart murmur    told once when she was pregnant- no furter mention- was in notes 11/2015- had Echo 8 /4/17  . History of blood transfusion   . Hypercholesteremia   . Hypertension   . Left foot infection   . Neuropathy    feet  . Neuropathy in diabetes ([place])   . Peripheral vascular disease ([place])   . Sleep apnea    not able to use CPAP  . Staphylococcus aureus bacteremia 10/2014  . Type 1 diabetes ([place])    onset age 11    Past Surgical History:  Procedure Laterality Date  . AMPUTATION TOE Left 07/24/2015   5th toe and Hallux amputation  . CESAREAN SECTION    . EXOSTECTECTOMY TOE Left 04/11/2016   Procedure: EXOSTECTECTOMY CUBOID AND 5TH METATARSAL AND PLACEMENT OF ANTIBIOTIC BEADS;  Surgeon: Edrick Kins, DPM;  Location: [facility];  Service: Podiatry;  Laterality: Left;  . EYE SURGERY Bilateral    [place]  . FEMORAL-POPLITEAL BYPASS GRAFT Left 05/03/2015  . FOOT AMPUTATION THROUGH METATARSAL Right 2016  . hemrrhoidectomy    . I&D EXTREMITY Right 06/10/2014   Procedure: IRRIGATION AND DEBRIDEMENT Right Foot;  Surgeon: Newt Minion, MD;  Location: [facility];  Service: Orthopedics;  Laterality: Right;  . INCISION AND DRAINAGE Left 04/11/2016   Procedure: INCISION AND DRAINAGE A DEEP COMPLICATED WOUND OF LEFT FOOT;  Surgeon: Edrick Kins, DPM;  Location: [facility];  Service: Podiatry;  Laterality: Left;  . IR FLUORO GUIDE CV LINE RIGHT  09/24/2017  . IR US GUIDE VASC ACCESS RIGHT  09/24/2017  . IRRIGATION AND DEBRIDEMENT ABSCESS Left 05/06/2017   Procedure: IRRIGATION AND DEBRIDEMENT ABSCESS LEFT FOOT;   Surgeon: Edrick Kins, DPM;  Location: [facility];  Service: Podiatry;  Laterality: Left;  . PERIPHERALLY INSERTED CENTRAL CATHETER INSERTION    . SKIN GRAFT Right 06/15/2014  . SKIN GRAFT Left 06/2015   foot   . SKIN SPLIT GRAFT Right 06/15/2014   Procedure: SPLIT THICKNESS SKIN GRAFT RIGHT FOOT;  Surgeon: Newt Minion, MD;  Location: [facility];  Service: Orthopedics;  Laterality: Right;  . TRANSMETATARSAL AMPUTATION Right   . TRANSMETATARSAL AMPUTATION Right 09/11/2014    Family History  Problem Relation Age of Onset  . Hyperlipidemia Mother   . Dementia Mother     Social History:  reports that she quit smoking about 9 years ago. She quit after 15.00 years of use. She has never used smokeless tobacco. She reports that she drinks alcohol. She reports that she does not use drugs.  Review of Systems:   Hypertension/CKD:     Hypertension  Treated with amlodipine 5 mg, followed by nephrologist  BP Readings from Last 3 Encounters:  11/15/17 132/88  10/29/17 (!) 171/92  10/24/17 (!) 167/90    She is taking calcitriol from nephrologist  Also has vitamin  D deficiency   Lab Results  Component Value Date   CALCIUM 8.1 (L) 11/11/2017   PHOS 3.5 08/18/2014     Lab Results  Component Value Date   CREATININE 3.92 (H) 11/11/2017   BUN 42 (H) 11/11/2017   NA 134 (L) 11/11/2017   K 5.1 11/11/2017   CL 107 11/11/2017   CO2 21 11/11/2017     Lipids: Has been  treated with pravastatin Triglycerides are high LDL as follows   Lab Results  Component Value Date   CHOL 176 09/26/2017   HDL 22.60 (L) 09/26/2017   LDLCALC 78 05/02/2016   LDLDIRECT 78.0 09/26/2017   TRIG (H) 09/26/2017    465.0 Triglyceride is over 400; calculations on Lipids are invalid.   CHOLHDL 8 09/26/2017      Chronic anemia, followed by nephrologist and PCP:  Lab Results  Component Value Date   WBC 5.4 10/22/2017   HGB 10.2 (L) 10/24/2017   HCT 29.6 (L) 10/22/2017   MCV 80.4 10/22/2017   PLT 505  (H) 10/22/2017    She has a Charcot foot And has had amputation of Left fifth toe  She is going to the podiatrist with recent healing of ulcers and presence of a skin graft     Examination:   BP 132/88 (BP Location: Left Arm, Patient Position: Sitting, Cuff Size: Normal)   Pulse 67   Ht 5\' 7"  (1.702 m)   Wt 124 lb (56.2 kg)   SpO2 97%   BMI 19.42 kg/m   Body mass index is 19.42 kg/m.      ASSESSMENT/ PLAN:   Diabetes type 1 with poor control and multiple complications    Her diabetes is persistently poorly controlled mostly from noncompliance with all measures of self-care  See history of present illness for detailed discussion of current diabetes management, blood sugar patterns and problems identified  Last A1c was still over 10%  Although she is still having poor control with her CGM her average blood sugar is in the low 200s, previously was higher Blood sugars are averaging 352 She may be benefiting more from using the CGM with better awareness of when she is having higher and lowered blood sugars  However because of tendency to periodic hypoglycemia and her fear of low sugars while living alone she is not bolusing adequately for meals and high sugars and also suspending her pump for long periods of time when blood sugars are relatively low With this she is using her bolus insulin only about 36% of the time compared to her total insulin regimen  DIABETIC nephropathy with nephrotic syndrome: followed by nephrologist    PLAN:     Discussed that if we can reduce her mealtime coverage, raise her blood sugar target and correction amounts of boluses she will have less tendency to hypoglycemia and she will need to start bolusing very consistently for all meals, carbohydrate snacks and high sugars throughout the day  She will avoid suspending the pump for long periods of time  Needs consistently balanced meals  She will need to follow-up with diabetes educator to  review her  Pump settings were changed in the office since she has visual difficulties  Her basal rate at 3 AM will be increased up to 0.5 until 6 AM  Her basal rate will be reduced from 11 AM until 2 PM down to 0.30  Carbohydrate coverage 1: 20 instead of 15  Sensitivity 1: 80 overnight and 1: 70 from 8 AM  until 10 PM  Blood sugar target 150  There are no Patient Instructions on file for this visit.   Counseling time on subjects discussed in assessment and plan sections is over 50% of today's 25 minute visit   Elayne Snare 11/15/2017, 1:30 PM

## 2017-11-18 ENCOUNTER — Encounter: Payer: Self-pay | Admitting: *Deleted

## 2017-11-18 ENCOUNTER — Ambulatory Visit (INDEPENDENT_AMBULATORY_CARE_PROVIDER_SITE_OTHER)
Admission: RE | Admit: 2017-11-18 | Discharge: 2017-11-18 | Disposition: A | Discharge status: Home / Self Care | Payer: Medicare Other | Source: Ambulatory Visit | Attending: Vascular Surgery | Admitting: Vascular Surgery

## 2017-11-18 ENCOUNTER — Ambulatory Visit (INDEPENDENT_AMBULATORY_CARE_PROVIDER_SITE_OTHER): Payer: Medicare Other | Admitting: Vascular Surgery

## 2017-11-18 ENCOUNTER — Other Ambulatory Visit: Payer: Self-pay

## 2017-11-18 ENCOUNTER — Other Ambulatory Visit: Payer: Self-pay | Admitting: *Deleted

## 2017-11-18 ENCOUNTER — Encounter: Payer: Self-pay | Admitting: Vascular Surgery

## 2017-11-18 ENCOUNTER — Ambulatory Visit (HOSPITAL_COMMUNITY)
Admission: RE | Admit: 2017-11-18 | Discharge: 2017-11-18 | Disposition: A | Discharge status: Home / Self Care | Payer: Medicare Other | Source: Ambulatory Visit | Attending: Vascular Surgery | Admitting: Vascular Surgery

## 2017-11-18 VITALS — BP 169/86 | HR 74 | Resp 20 | Ht 67.0 in | Wt 124.0 lb

## 2017-11-18 DIAGNOSIS — I10 Essential (primary) hypertension: Secondary | ICD-10-CM | POA: Insufficient documentation

## 2017-11-18 DIAGNOSIS — N185 Chronic kidney disease, stage 5: Secondary | ICD-10-CM

## 2017-11-18 DIAGNOSIS — E119 Type 2 diabetes mellitus without complications: Secondary | ICD-10-CM | POA: Insufficient documentation

## 2017-11-18 DIAGNOSIS — Z87891 Personal history of nicotine dependence: Secondary | ICD-10-CM | POA: Insufficient documentation

## 2017-11-18 DIAGNOSIS — Z01818 Encounter for other preprocedural examination: Secondary | ICD-10-CM | POA: Insufficient documentation

## 2017-11-18 DIAGNOSIS — I70235 Atherosclerosis of native arteries of right leg with ulceration of other part of foot: Secondary | ICD-10-CM

## 2017-11-18 DIAGNOSIS — N184 Chronic kidney disease, stage 4 (severe): Secondary | ICD-10-CM

## 2017-11-18 NOTE — Progress Notes (Signed)
Patient name: [name] MRN: [id number] DOB: 1970/06/07 Sex: female   REASON FOR CONSULT:    To evaluate for an AV fistula.  This was a "urgent" appointment requested by Dr. Edrick Oh.  HPI:   [name] is a pleasant 47 y.o. female, who is referred for evaluation of an AV fistula.  I have reviewed the records from the referring office.  The patient was seen on 11/08/2017.  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  Patient was euvolemic with stable electrolytes.  Most recent GFR in March was 22.  On my history, the patient complains of fatigue, some anorexia, and some fluid retention.  She has a history of diabetes and hypertension.  She has had a chronic wound on her left foot which has been healing with aggressive outpatient wound care including a split-thickness skin graft, intravenous antibiotics via a PICC line, and hyperbaric oxygen.  She has a dressing on her foot and did not want me to look at her wound today.  She assures me that the wound has almost healed.  She is scheduled to have her PICC line out tomorrow.  Past Medical History:  Diagnosis Date  . Anemia   . Arthritis   . Bronchitis   . C. difficile diarrhea 09/26/2014  . Cataracts, both eyes   . Cellulitis of right foot 06/02/2014  . CKD (chronic kidney disease) stage 3, GFR 30-59 ml/min (HCC)    sees Dr Justin Mend- Stage IV  . Diabetic ulcer of right foot ([place]) 06/02/2014  . DVT (deep venous thrombosis) ([place]) 10/2014   one in each one in leg- PICC line , one in right and left arm  . H/O seasonal allergies   . Heart murmur    told once when she was pregnant- no furter mention- was in notes 11/2015- had Echo 8 /4/17  . History of blood transfusion   . Hypercholesteremia   . Hypertension   . Left foot infection   . Neuropathy    feet  . Neuropathy in diabetes ([place])   . Peripheral vascular disease ([place])   . Sleep apnea    not able to use CPAP  .  Staphylococcus aureus bacteremia 10/2014  . Type 1 diabetes ([place])    onset age 31    Family History  Problem Relation Age of Onset  . Hyperlipidemia Mother   . Dementia Mother     SOCIAL HISTORY: Social History   Socioeconomic History  . Marital status: Single    Spouse name: Not on file  . Number of children: Not on file  . Years of education: Not on file  . Highest education level: Not on file  Occupational History  . Occupation: disabled  Social Needs  . Financial resource strain: Not on file  . Food insecurity:    Worry: Not on file    Inability: Not on file  . Transportation needs:    Medical: Not on file    Non-medical: Not on file  Tobacco Use  . Smoking status: Former Smoker    Years: 15.00    Last attempt to quit: 05/16/2008    Years since quitting: 9.5  . Smokeless tobacco: Never Used  Substance and Sexual Activity  . Alcohol use: Yes    Comment: Occasional  . Drug use: No  . Sexual activity: Yes    Birth control/protection: Pill  Lifestyle  . Physical activity:    Days  per week: Not on file    Minutes per session: Not on file  . Stress: Not on file  Relationships  . Social connections:    Talks on phone: Not on file    Gets together: Not on file    Attends religious service: Not on file    Active member of club or organization: Not on file    Attends meetings of clubs or organizations: Not on file    Relationship status: Not on file  . Intimate partner violence:    Fear of current or ex partner: Not on file    Emotionally abused: Not on file    Physically abused: Not on file    Forced sexual activity: Not on file  Other Topics Concern  . Not on file  Social History Narrative  . Not on file    Allergies  Allergen Reactions  . Cleocin [Clindamycin Hcl] Diarrhea    Diarrhea   . Lisinopril Other (See Comments)    Elevated potassium per pt report  . Amoxicillin Diarrhea and Nausea Only    Has patient had a PCN reaction causing immediate  rash, facial/tongue/throat swelling, SOB or lightheadedness with hypotension: No Has patient had a PCN reaction causing severe rash involving mucus membranes or skin necrosis: No Has patient had a PCN reaction that required hospitalization: No Has patient had a PCN reaction occurring within the last 10 years: No If all of the above answers are "NO", then may proceed with Cephalosporin use.   . Bactrim [Sulfamethoxazole-Trimethoprim] Diarrhea and Nausea Only    Current Outpatient Medications  Medication Sig Dispense Refill  . albuterol (PROVENTIL HFA;VENTOLIN HFA) 108 (90 BASE) MCG/ACT inhaler Inhale 2 puffs into the lungs every 6 (six) hours as needed for wheezing or shortness of breath. Reported on 04/15/2015    . amLODipine (NORVASC) 2.5 MG tablet TAKE 1 TABLET BY MOUTH EVERY DAY 30 tablet 0  . amLODipine (NORVASC) 5 MG tablet Take 1 tablet (5 mg total) by mouth daily. (Patient taking differently: Take 5 mg by mouth every evening. ) 30 tablet 0  . aspirin 81 MG tablet Take 81 mg by mouth every evening.     . calcitRIOL (ROCALTROL) 0.5 MCG capsule Take 1 capsule daily 90 capsule 2  . Calcium-Phosphorus-Vitamin D (CALCIUM GUMMIES) [phone] MG-MG-UNIT CHEW Chew 2 each by mouth every evening.     Marland Kitchen CINNAMON PO Take 1 tablet by mouth every evening.     . Continuous Blood Gluc Receiver (FREESTYLE LIBRE READER) DEVI 1 Device by Does not apply route as directed. 1 Device 1  . Continuous Blood Gluc Sensor (FREESTYLE LIBRE SENSOR SYSTEM) MISC Use 1 sensor every 10 days 3 each 4  . DAPTOMYCIN IV Inject into the vein.    . folic acid (FOLVITE) 1 MG tablet Take 1 mg by mouth every evening.     . furosemide (LASIX) 40 MG tablet Take 40 mg by mouth daily as needed for fluid.     Marland Kitchen glucose blood (CONTOUR NEXT TEST) test strip USE AS INSTRUCTED TO CHECK BLOOD SUGAR 4 TIMES A DAY. DX E10.65 150 each 3  . HUMALOG 100 UNIT/ML injection CALL MD IF <70, IF 151-200 =2UNITS,201-250 =4 UNITS,251-300 =6 UNITS,  301-350 =8 UNITS,351-400 =10 U 30 mL 0  . Insulin Pen Needle (PEN NEEDLES) 32G X 4 MM MISC 1 Device by Does not apply route 2 (two) times daily. 50 each 1  . ketorolac (ACULAR) 0.5 % ophthalmic solution PLACE 1 DROP IN  RIGHT EYE FOUR TIMES A DAY. START 1 WEEK PRIOR TO SURGERY  4  . loratadine (CLARITIN) 10 MG tablet Take 1 tablet (10 mg total) by mouth daily. (Patient taking differently: Take 10 mg by mouth daily as needed for allergies. ) 30 tablet 0  . metroNIDAZOLE (FLAGYL) 500 MG tablet Take 1 tablet (500 mg total) by mouth 3 (three) times daily for 21 days. 90 tablet 1  . norethindrone (AYGESTIN) 5 MG tablet Take by mouth.    . ondansetron (ZOFRAN-ODT) 4 MG disintegrating tablet DISSOLVE 1 TABLET ON TONGUE EVERY 8 HOURS AS NEEDED FOR NAUSEA  0  . pravastatin (PRAVACHOL) 40 MG tablet TAKE 1 TABLET BY MOUTH EVERY DAY 90 tablet 1  . pyridoxine (B-6) 100 MG tablet Take 100 mg by mouth daily. Reported on 04/15/2015    . sulfamethoxazole-trimethoprim (BACTRIM DS,SEPTRA DS) 800-160 MG tablet Take 1 tablet by mouth 2 (two) times daily for 21 days. 60 tablet 1  . vitamin B-12 (V-R VITAMIN B-12) 500 MCG tablet Take by mouth.    . vitamin C (ASCORBIC ACID) 500 MG tablet Take 500 mg by mouth daily.    . Vitamin D, Ergocalciferol, (DRISDOL) 50000 units CAPS capsule TAKE ONE CAPSULE BY MOUTH ONE TIME PER WEEK 12 capsule 0   No current facility-administered medications for this visit.     REVIEW OF SYSTEMS:  [X]  denotes positive finding, [ ]  denotes negative finding Cardiac  Comments:  Chest pain or chest pressure:    Shortness of breath upon exertion:    Short of breath when lying flat:    Irregular heart rhythm:        Vascular    Pain in calf, thigh, or hip brought on by ambulation:    Pain in feet at night that wakes you up from your sleep:     Blood clot in your veins:    Leg swelling:         Pulmonary    Oxygen at home:    Productive cough:     Wheezing:         Neurologic    Sudden  weakness in arms or legs:     Sudden numbness in arms or legs:     Sudden onset of difficulty speaking or slurred speech:    Temporary loss of vision in one eye:     Problems with dizziness:         Gastrointestinal    Blood in stool:     Vomited blood:         Genitourinary    Burning when urinating:     Blood in urine:        Psychiatric    Major depression:         Hematologic    Bleeding problems:    Problems with blood clotting too easily:        Skin    Rashes or ulcers:        Constitutional    Fever or chills:     PHYSICAL EXAM:   Vitals:   11/18/17 1134  BP: (!) 169/86  Pulse: 74  Resp: 20  SpO2: 97%  Weight: 124 lb (56.2 kg)  Height: 5\' 7"  (1.702 m)    GENERAL: The patient is a well-nourished female, in no acute distress. The vital signs are documented above. CARDIAC: There is a regular rate and rhythm.  VASCULAR: She has palpable radial pulses bilaterally. I do not see any usable surface veins. I  did look at her veins with the SonoSite myself and did not see any usable veins. PULMONARY: There is good air exchange bilaterally without wheezing or rales. ABDOMEN: Soft and non-tender with normal pitched bowel sounds.  MUSCULOSKELETAL: There are no major deformities or cyanosis. NEUROLOGIC: No focal weakness or paresthesias are detected. SKIN: There are no ulcers or rashes noted. PSYCHIATRIC: The patient has a normal affect.  DATA:    UPPER EXTREMITY ARTERIAL DUPLEX: I have independently interpreted her upper extremity arterial duplex scan.  On the right side there is a triphasic radial and ulnar waveform.  The brachial artery measures 0.44 cm in diameter.  On the left side there is a triphasic radial and ulnar waveform.  The brachial artery measures 0.42 cm in diameter.  UPPER EXTREMITY VEIN MAP: I have independently interpreted her upper extremity vein map.  On the right side the forearm cephalic vein looks small.  The upper arm cephalic vein looks  potentially usable.  The basilic vein on the right looks marginal.  On the left side the forearm and upper arm cephalic vein looks small.  The basilic vein on the left looks small.  MEDICAL ISSUES:   STAGE V CHRONIC KIDNEY DISEASE: Based on her vein map, she does not appear to be a candidate for an AV fistula.  I have discussed the case with Dr. Edrick Oh on the phone and he would like [place] to proceed with placement of an AV graft if an AV fistula is not possible. I have explained the indications for placement of an AV fistula or AV graft. I've explained that if at all possible we will place an AV fistula.  I have reviewed the risks of placement of an AV fistula including but not limited to: failure of the fistula to mature, need for subsequent interventions, and thrombosis. In addition I have reviewed the potential complications of placement of an AV graft. These risks include, but are not limited to, graft thrombosis, graft infection, wound healing problems, bleeding, arm swelling, and steal syndrome. All the patient's questions were answered and they are agreeable to proceed with surgery.  She is scheduled to have her PICC line out later this week.  She would like to schedule for surgery on 12/03/2017.   Deitra Mayo [facility] [phone]

## 2017-11-19 ENCOUNTER — Other Ambulatory Visit: Payer: Self-pay | Admitting: Internal Medicine

## 2017-11-21 ENCOUNTER — Encounter (HOSPITAL_COMMUNITY): Payer: Medicare Other

## 2017-11-21 NOTE — Telephone Encounter (Signed)
The patient called to get the order for PICC removal.  Called Dr Megan Salon and he advised she can have the PICC removed as of today.  Called [facility] to give verbal to D/C the PICC today 11/21/17.

## 2017-11-22 ENCOUNTER — Encounter (HOSPITAL_COMMUNITY): Payer: Self-pay | Admitting: Student

## 2017-11-22 ENCOUNTER — Ambulatory Visit (HOSPITAL_COMMUNITY)
Admission: RE | Admit: 2017-11-22 | Discharge: 2017-11-22 | Disposition: A | Discharge status: Home / Self Care | Payer: Medicare Other | Source: Ambulatory Visit | Attending: Internal Medicine | Admitting: Internal Medicine

## 2017-11-22 ENCOUNTER — Other Ambulatory Visit: Payer: Self-pay | Admitting: Internal Medicine

## 2017-11-22 DIAGNOSIS — E1022 Type 1 diabetes mellitus with diabetic chronic kidney disease: Secondary | ICD-10-CM | POA: Insufficient documentation

## 2017-11-22 DIAGNOSIS — Z880 Allergy status to penicillin: Secondary | ICD-10-CM | POA: Diagnosis not present

## 2017-11-22 DIAGNOSIS — E7800 Pure hypercholesterolemia, unspecified: Secondary | ICD-10-CM | POA: Insufficient documentation

## 2017-11-22 DIAGNOSIS — L089 Local infection of the skin and subcutaneous tissue, unspecified: Secondary | ICD-10-CM | POA: Insufficient documentation

## 2017-11-22 DIAGNOSIS — Z86718 Personal history of other venous thrombosis and embolism: Secondary | ICD-10-CM | POA: Insufficient documentation

## 2017-11-22 DIAGNOSIS — Z881 Allergy status to other antibiotic agents status: Secondary | ICD-10-CM | POA: Insufficient documentation

## 2017-11-22 DIAGNOSIS — Z794 Long term (current) use of insulin: Secondary | ICD-10-CM | POA: Diagnosis not present

## 2017-11-22 DIAGNOSIS — Z888 Allergy status to other drugs, medicaments and biological substances status: Secondary | ICD-10-CM | POA: Diagnosis not present

## 2017-11-22 DIAGNOSIS — Z452 Encounter for adjustment and management of vascular access device: Secondary | ICD-10-CM | POA: Insufficient documentation

## 2017-11-22 DIAGNOSIS — Z79899 Other long term (current) drug therapy: Secondary | ICD-10-CM | POA: Diagnosis not present

## 2017-11-22 DIAGNOSIS — E1040 Type 1 diabetes mellitus with diabetic neuropathy, unspecified: Secondary | ICD-10-CM | POA: Diagnosis not present

## 2017-11-22 DIAGNOSIS — N183 Chronic kidney disease, stage 3 (moderate): Secondary | ICD-10-CM | POA: Diagnosis not present

## 2017-11-22 DIAGNOSIS — E11628 Type 2 diabetes mellitus with other skin complications: Secondary | ICD-10-CM

## 2017-11-22 DIAGNOSIS — M1990 Unspecified osteoarthritis, unspecified site: Secondary | ICD-10-CM | POA: Diagnosis not present

## 2017-11-22 DIAGNOSIS — Z87891 Personal history of nicotine dependence: Secondary | ICD-10-CM | POA: Diagnosis not present

## 2017-11-22 DIAGNOSIS — H269 Unspecified cataract: Secondary | ICD-10-CM | POA: Insufficient documentation

## 2017-11-22 DIAGNOSIS — Z8619 Personal history of other infectious and parasitic diseases: Secondary | ICD-10-CM | POA: Insufficient documentation

## 2017-11-22 DIAGNOSIS — Z7982 Long term (current) use of aspirin: Secondary | ICD-10-CM | POA: Diagnosis not present

## 2017-11-22 DIAGNOSIS — E10628 Type 1 diabetes mellitus with other skin complications: Secondary | ICD-10-CM | POA: Diagnosis not present

## 2017-11-22 DIAGNOSIS — Z8249 Family history of ischemic heart disease and other diseases of the circulatory system: Secondary | ICD-10-CM | POA: Diagnosis not present

## 2017-11-22 HISTORY — PX: IR REMOVAL TUN CV CATH W/O FL: IMG2289

## 2017-11-22 MED ORDER — LIDOCAINE HCL 1 % IJ SOLN
INTRAMUSCULAR | Status: AC
  Filled 2017-11-22: qty 20

## 2017-11-22 MED ORDER — LIDOCAINE HCL (PF) 1 % IJ SOLN
INTRAMUSCULAR | Status: DC | PRN
  Administered 2017-11-22: 10 mL

## 2017-11-22 MED ORDER — CHLORHEXIDINE GLUCONATE 4 % EX LIQD
CUTANEOUS | Status: AC
  Filled 2017-11-22: qty 15

## 2017-11-26 ENCOUNTER — Ambulatory Visit (HOSPITAL_COMMUNITY)
Admission: RE | Admit: 2017-11-26 | Discharge: 2017-11-26 | Disposition: A | Discharge status: Home / Self Care | Payer: Medicare Other | Source: Ambulatory Visit | Attending: Nephrology | Admitting: Nephrology

## 2017-11-26 DIAGNOSIS — D509 Iron deficiency anemia, unspecified: Secondary | ICD-10-CM | POA: Insufficient documentation

## 2017-11-26 LAB — CBC WITH DIFFERENTIAL/PLATELET
BASOS ABS: 18 {cells}/uL (ref 0–200)
BASOS PCT: 0.4 %
BASOS PCT: 0.4 %
Basophils Absolute: 18 cells/uL (ref 0–200)
Basophils Absolute: 27 cells/uL (ref 0–200)
Basophils Absolute: 32 cells/uL (ref 0–200)
Basophils Relative: 0.3 %
Basophils Relative: 0.6 %
EOS ABS: 214 {cells}/uL (ref 15–500)
EOS PCT: 3.5 %
EOS PCT: 2.8 %
Eosinophils Absolute: 108 cells/uL (ref 15–500)
Eosinophils Absolute: 129 cells/uL (ref 15–500)
Eosinophils Absolute: 238 cells/uL (ref 15–500)
Eosinophils Relative: 2 %
Eosinophils Relative: 3.5 %
HCT: 29 % — ABNORMAL LOW (ref 38.5–45.0)
HCT: 30.7 % — ABNORMAL LOW (ref 35.0–45.0)
HEMATOCRIT: 29.6 % — AB (ref 35.0–45.0)
HEMATOCRIT: 27.6 % — AB (ref 35.0–45.0)
HEMOGLOBIN: 8.5 g/dL — AB (ref 11.7–15.5)
HEMOGLOBIN: 9 g/dL — AB (ref 13.2–15.5)
Hemoglobin: 9 g/dL — ABNORMAL LOW (ref 11.7–15.5)
Hemoglobin: 9.4 g/dL — ABNORMAL LOW (ref 11.7–15.5)
LYMPHS ABS: 1639 {cells}/uL (ref 850–3900)
LYMPHS ABS: 1495 {cells}/uL (ref 850–3900)
Lymphs Abs: 1674 cells/uL (ref 850–3900)
Lymphs Abs: 1964 cells/uL (ref 850–3900)
MCH: 24.2 pg — ABNORMAL LOW (ref 27.0–33.0)
MCH: 25.5 pg — ABNORMAL LOW (ref 27.0–33.0)
MCH: 26.9 pg — ABNORMAL LOW (ref 27.0–33.0)
MCH: 24.4 pg — AB (ref 27.0–33.0)
MCHC: 30.4 g/dL — ABNORMAL LOW (ref 32.0–36.0)
MCHC: 30.6 g/dL — ABNORMAL LOW (ref 32.0–36.0)
MCHC: 30.8 g/dL — ABNORMAL LOW (ref 32.0–36.0)
MCHC: 31 g/dL — AB (ref 32.0–36.0)
MCV: 79.6 fL — ABNORMAL LOW (ref 80.0–100.0)
MCV: 79.7 fL — ABNORMAL LOW (ref 80.0–100.0)
MCV: 82.9 fL (ref 80.0–100.0)
MCV: 86.6 fL (ref 80.0–100.0)
MONOS PCT: 8.2 %
MPV: 10 fL (ref 7.5–12.5)
MPV: 9.9 fL (ref 7.5–12.5)
MPV: 9.9 fL (ref 7.5–12.5)
MPV: 11.1 fL (ref 7.5–12.5)
Monocytes Relative: 6.5 %
Monocytes Relative: 7.7 %
Monocytes Relative: 8.3 %
NEUTROS PCT: 55.8 %
NEUTROS PCT: 58.7 %
NEUTROS PCT: 57.8 %
Neutro Abs: 2659 cells/uL (ref 1500–7800)
Neutro Abs: 3170 cells/uL (ref 1500–7800)
Neutro Abs: 3404 cells/uL (ref 1500–7800)
Neutro Abs: 4332 cells/uL (ref 1500–7800)
Neutrophils Relative %: 63.7 %
PLATELETS: 597 10*3/uL — AB (ref 140–400)
Platelets: 360 10*3/uL (ref 140–400)
Platelets: 540 10*3/uL — ABNORMAL HIGH (ref 140–400)
Platelets: 557 10*3/uL — ABNORMAL HIGH (ref 140–400)
RBC: 3.33 10*6/uL — ABNORMAL LOW (ref 3.80–5.10)
RBC: 3.85 10*6/uL (ref 3.80–5.10)
RBC: 3.72 10*6/uL — AB (ref 3.80–5.10)
RBC: 3.35 10*6/uL — AB (ref 4.20–5.10)
RDW: 21.1 % — ABNORMAL HIGH (ref 11.0–15.0)
RDW: 17 % — AB (ref 11.0–15.0)
RDW: 17.5 % — AB (ref 11.0–15.0)
RDW: 21.8 % — AB (ref 11.0–15.0)
TOTAL LYMPHOCYTE: 32.2 %
Total Lymphocyte: 24.1 %
Total Lymphocyte: 31 %
Total Lymphocyte: 32.5 %
WBC mixed population: 299 cells/uL (ref 200–950)
WBC mixed population: 416 cells/uL (ref 200–950)
WBC mixed population: 500 cells/uL (ref 200–950)
WBC mixed population: 564 cells/uL (ref 200–950)
WBC: 6.1 10*3/uL (ref 3.8–10.8)
WBC: 5.4 10*3/uL (ref 3.8–10.8)
WBC: 6.8 10*3/uL (ref 3.8–10.8)
WBC: 4.6 10*3/uL (ref 3.8–10.8)

## 2017-11-26 LAB — BASIC METABOLIC PANEL WITH GFR
BUN / CREAT RATIO: 16 (calc) (ref 6–22)
BUN / CREAT RATIO: 12 (calc) (ref 6–22)
BUN/Creatinine Ratio: 11 (calc) (ref 6–22)
BUN/Creatinine Ratio: 14 (calc) (ref 6–22)
BUN/Creatinine Ratio: 14 (calc) (ref 6–22)
BUN: 43 mg/dL — ABNORMAL HIGH (ref 7–25)
BUN: 44 mg/dL — ABNORMAL HIGH (ref 7–25)
BUN: 45 mg/dL — ABNORMAL HIGH (ref 7–25)
BUN: 49 mg/dL — ABNORMAL HIGH (ref 7–25)
BUN: 45 mg/dL — AB (ref 7–25)
CALCIUM: 5.9 mg/dL — AB (ref 8.6–10.2)
CALCIUM: 7.2 mg/dL — AB (ref 8.6–10.2)
CALCIUM: 7.7 mg/dL — AB (ref 8.6–10.2)
CHLORIDE: 108 mmol/L (ref 98–110)
CO2: <10 mmol/L — ABNORMAL LOW (ref 20–32)
CO2: 14 mmol/L — AB (ref 20–32)
CO2: 14 mmol/L — AB (ref 20–32)
CO2: 16 mmol/L — AB (ref 20–32)
CO2: 16 mmol/L — AB (ref 20–32)
CREATININE: 3.81 mg/dL — AB (ref 0.50–1.10)
Calcium: 7.2 mg/dL — ABNORMAL LOW
Calcium: 7.7 mg/dL — ABNORMAL LOW (ref 8.6–10.2)
Chloride: 109 mmol/L (ref 98–110)
Chloride: 103 mmol/L (ref 98–110)
Chloride: 108 mmol/L (ref 98–110)
Chloride: 115 mmol/L — ABNORMAL HIGH (ref 98–110)
Creat: 3.1 mg/dL — ABNORMAL HIGH (ref 0.50–1.10)
Creat: 3.14 mg/dL — ABNORMAL HIGH (ref 0.50–1.10)
Creat: 3.33 mg/dL — ABNORMAL HIGH (ref 0.50–1.10)
Creat: 3.87 mg/dL — ABNORMAL HIGH
GFR, EST AFRICAN AMERICAN: 20 mL/min/{1.73_m2} — AB (ref >=60)
GFR, EST NON AFRICAN AMERICAN: 16 mL/min/{1.73_m2} — AB (ref >=60)
GFR, EST NON AFRICAN AMERICAN: 13 mL/min/{1.73_m2} — AB (ref >=60)
GFR, Est African American: 16 mL/min/{1.73_m2} — ABNORMAL LOW (ref >=60)
GFR, Est African American: 18 mL/min/{1.73_m2} — ABNORMAL LOW (ref >=60)
GFR, Est African American: 20 mL/min/{1.73_m2} — ABNORMAL LOW (ref >=60)
GFR, Est Non African American: 17 mL/min/{1.73_m2} — ABNORMAL LOW (ref >=60)
GFR, Est Non African American: 17 mL/min/{1.73_m2} — ABNORMAL LOW (ref >=60)
GLUCOSE: 186 mg/dL — AB (ref 65–139)
GLUCOSE: 453 mg/dL — AB (ref 65–139)
Glucose, Bld: 373 mg/dL — ABNORMAL HIGH (ref 65–139)
Glucose, Bld: 160 mg/dL — ABNORMAL HIGH (ref 65–139)
Glucose, Bld: 186 mg/dL — ABNORMAL HIGH (ref 65–139)
POTASSIUM: 4.2 mmol/L (ref 3.5–5.3)
POTASSIUM: 5.7 mmol/L — AB (ref 3.5–5.3)
POTASSIUM: 6.3 mmol/L — AB (ref 3.5–5.3)
Potassium: 5.1 mmol/L (ref 3.5–5.3)
Potassium: 5.6 mmol/L — ABNORMAL HIGH (ref 3.5–5.3)
SODIUM: 132 mmol/L — AB (ref 135–146)
SODIUM: 137 mmol/L (ref 135–146)
Sodium: 135 mmol/L (ref 135–146)
Sodium: 132 mmol/L — ABNORMAL LOW (ref 135–146)
Sodium: 130 mmol/L — ABNORMAL LOW (ref 135–146)

## 2017-11-26 LAB — SEDIMENTATION RATE
SED RATE: 58 mm/h — AB (ref 0–20)
SED RATE: 46 mm/h — AB (ref 0–15)
Sed Rate: 68 mm/h — ABNORMAL HIGH (ref 0–20)
Sed Rate: 74 mm/h — ABNORMAL HIGH (ref 0–20)

## 2017-11-26 LAB — IRON AND TIBC
Iron: 50 ug/dL (ref 28–170)
SATURATION RATIOS: 19 % (ref 10.4–31.8)
TIBC: 258 ug/dL (ref 250–450)
UIBC: 208 ug/dL

## 2017-11-26 LAB — CK
CK TOTAL: 82 U/L (ref 29–143)
CK TOTAL: 68 U/L (ref 29–143)
Total CK: 59 U/L (ref 29–143)
Total CK: 85 U/L (ref 29–196)

## 2017-11-26 LAB — SPECIMEN COMPROMISED

## 2017-11-26 LAB — POTASSIUM
POTASSIUM: 4.6 mmol/L (ref 3.5–5.3)
Potassium: 6.1 mmol/L — ABNORMAL HIGH (ref 3.5–5.3)

## 2017-11-26 LAB — C-REACTIVE PROTEIN
CRP: 0.4 mg/L (ref <8.0)
CRP: 0.6 mg/L (ref <8.0)
CRP: 1.1 mg/L (ref <8.0)
CRP: 0.8 mg/L (ref <8.0)

## 2017-11-26 LAB — HEMOGLOBIN: Hemoglobin: 10.8 g/dL — ABNORMAL LOW (ref 12.0–15.0)

## 2017-11-26 LAB — FERRITIN: Ferritin: 36 ng/mL (ref 11–307)

## 2017-11-26 MED ORDER — EPOETIN ALFA 10000 UNIT/ML IJ SOLN
10000.0000 [IU] | INTRAMUSCULAR | Status: DC
  Administered 2017-11-26: 10000 [IU] via SUBCUTANEOUS
  Filled 2017-11-26: qty 1

## 2017-11-26 MED ORDER — SODIUM CHLORIDE 0.9 % IV SOLN
INTRAVENOUS | Status: AC
  Administered 2017-11-26: 14:00:00 via INTRAVENOUS

## 2017-11-26 MED ORDER — SODIUM CHLORIDE 0.9 % IV SOLN
510.0000 mg | INTRAVENOUS | Status: AC
  Administered 2017-11-26: 510 mg via INTRAVENOUS
  Filled 2017-11-26: qty 17

## 2017-11-26 NOTE — Progress Notes (Signed)
Patient received Feraheme via PIV. Observed for at least 30 minutes post infusion. Tolerated well and vitals were stable. Lab draws were done per order.  Procrit sub-cutaneous injection given since Blood pressure and Hemoglobin level met the parameters as ordered. Discharge instructions given, patient verbalized understanding. Patient alert, oriented and left in a wheel chair at the time of discharge.  

## 2017-11-26 NOTE — Discharge Instructions (Signed)
Ferumoxytol injection °What is this medicine? °FERUMOXYTOL is an iron complex. Iron is used to make healthy red blood cells, which carry oxygen and nutrients throughout the body. This medicine is used to treat iron deficiency anemia in people with chronic kidney disease. °This medicine may be used for other purposes; ask your health care provider or pharmacist if you have questions. °COMMON BRAND NAME(S): Feraheme °What should I tell my health care provider before I take this medicine? °They need to know if you have any of these conditions: °-anemia not caused by low iron levels °-high levels of iron in the blood °-magnetic resonance imaging (MRI) test scheduled °-an unusual or allergic reaction to iron, other medicines, foods, dyes, or preservatives °-pregnant or trying to get pregnant °-breast-feeding °How should I use this medicine? °This medicine is for injection into a vein. It is given by a health care professional in a hospital or clinic setting. °Talk to your pediatrician regarding the use of this medicine in children. Special care may be needed. °Overdosage: If you think you have taken too much of this medicine contact a poison control center or emergency room at once. °NOTE: This medicine is only for you. Do not share this medicine with others. °What if I miss a dose? °It is important not to miss your dose. Call your doctor or health care professional if you are unable to keep an appointment. °What may interact with this medicine? °This medicine may interact with the following medications: °-other iron products °This list may not describe all possible interactions. Give your health care provider a list of all the medicines, herbs, non-prescription drugs, or dietary supplements you use. Also tell them if you smoke, drink alcohol, or use illegal drugs. Some items may interact with your medicine. °What should I watch for while using this medicine? °Visit your doctor or healthcare professional regularly. Tell  your doctor or healthcare professional if your symptoms do not start to get better or if they get worse. You may need blood work done while you are taking this medicine. °You may need to follow a special diet. Talk to your doctor. Foods that contain iron include: whole grains/cereals, dried fruits, beans, or peas, leafy green vegetables, and organ meats (liver, kidney). °What side effects may I notice from receiving this medicine? °Side effects that you should report to your doctor or health care professional as soon as possible: °-allergic reactions like skin rash, itching or hives, swelling of the face, lips, or tongue °-breathing problems °-changes in blood pressure °-feeling faint or lightheaded, falls °-fever or chills °-flushing, sweating, or hot feelings °-swelling of the ankles or feet °Side effects that usually do not require medical attention (report to your doctor or health care professional if they continue or are bothersome): °-diarrhea °-headache °-nausea, vomiting °-stomach pain °This list may not describe all possible side effects. Call your doctor for medical advice about side effects. You may report side effects to FDA at 1-800-FDA-1088. °Where should I keep my medicine? °This drug is given in a hospital or clinic and will not be stored at home. °NOTE: This sheet is a summary. It may not cover all possible information. If you have questions about this medicine, talk to your doctor, pharmacist, or health care provider. °© 2018 Elsevier/Gold Standard (2015-04-21 12:41:49) ° ° °Epoetin Alfa injection °What is this medicine? °EPOETIN ALFA (e POE e tin AL fa) helps your body make more red blood cells. This medicine is used to treat anemia caused by chronic kidney   failure, cancer chemotherapy, or HIV-therapy. It may also be used before surgery if you have anemia. °This medicine may be used for other purposes; ask your health care provider or pharmacist if you have questions. °COMMON BRAND NAME(S):  Epogen, Procrit °What should I tell my health care provider before I take this medicine? °They need to know if you have any of these conditions: °-blood clotting disorders °-cancer patient not on chemotherapy °-cystic fibrosis °-heart disease, such as angina or heart failure °-hemoglobin level of 12 g/dL or greater °-high blood pressure °-low levels of folate, iron, or vitamin B12 °-seizures °-an unusual or allergic reaction to erythropoietin, albumin, benzyl alcohol, hamster proteins, other medicines, foods, dyes, or preservatives °-pregnant or trying to get pregnant °-breast-feeding °How should I use this medicine? °This medicine is for injection into a vein or under the skin. It is usually given by a health care professional in a hospital or clinic setting. °If you get this medicine at home, you will be taught how to prepare and give this medicine. Use exactly as directed. Take your medicine at regular intervals. Do not take your medicine more often than directed. °It is important that you put your used needles and syringes in a special sharps container. Do not put them in a trash can. If you do not have a sharps container, call your pharmacist or healthcare provider to get one. °A special MedGuide will be given to you by the pharmacist with each prescription and refill. Be sure to read this information carefully each time. °Talk to your pediatrician regarding the use of this medicine in children. While this drug may be prescribed for selected conditions, precautions do apply. °Overdosage: If you think you have taken too much of this medicine contact a poison control center or emergency room at once. °NOTE: This medicine is only for you. Do not share this medicine with others. °What if I miss a dose? °If you miss a dose, take it as soon as you can. If it is almost time for your next dose, take only that dose. Do not take double or extra doses. °What may interact with this medicine? °Do not take this medicine with  any of the following medications: °-darbepoetin alfa °This list may not describe all possible interactions. Give your health care provider a list of all the medicines, herbs, non-prescription drugs, or dietary supplements you use. Also tell them if you smoke, drink alcohol, or use illegal drugs. Some items may interact with your medicine. °What should I watch for while using this medicine? °Your condition will be monitored carefully while you are receiving this medicine. °You may need blood work done while you are taking this medicine. °What side effects may I notice from receiving this medicine? °Side effects that you should report to your doctor or health care professional as soon as possible: °-allergic reactions like skin rash, itching or hives, swelling of the face, lips, or tongue °-breathing problems °-changes in vision °-chest pain °-confusion, trouble speaking or understanding °-feeling faint or lightheaded, falls °-high blood pressure °-muscle aches or pains °-pain, swelling, warmth in the leg °-rapid weight gain °-severe headaches °-sudden numbness or weakness of the face, arm or leg °-trouble walking, dizziness, loss of balance or coordination °-seizures (convulsions) °-swelling of the ankles, feet, hands °-unusually weak or tired °Side effects that usually do not require medical attention (report to your doctor or health care professional if they continue or are bothersome): °-diarrhea °-fever, chills (flu-like symptoms) °-headaches °-nausea, vomiting °-redness, stinging,   or swelling at site where injected °This list may not describe all possible side effects. Call your doctor for medical advice about side effects. You may report side effects to FDA at 1-800-FDA-1088. °Where should I keep my medicine? °Keep out of the reach of children. °Store in a refrigerator between 2 and 8 degrees C (36 and 46 degrees F). Do not freeze or shake. Throw away any unused portion if using a single-dose vial. Multi-dose  vials can be kept in the refrigerator for up to 21 days after the initial dose. Throw away unused medicine. °NOTE: This sheet is a summary. It may not cover all possible information. If you have questions about this medicine, talk to your doctor, pharmacist, or health care provider. °© 2018 Elsevier/Gold Standard (2015-11-07 19:42:31) ° °

## 2017-11-27 ENCOUNTER — Encounter (HOSPITAL_COMMUNITY)
Admission: RE | Admit: 2017-11-27 | Payer: Medicare Other | Source: Ambulatory Visit | Attending: Nephrology | Admitting: Nephrology

## 2017-11-27 DIAGNOSIS — N921 Excessive and frequent menstruation with irregular cycle: Secondary | ICD-10-CM | POA: Insufficient documentation

## 2017-11-27 DIAGNOSIS — N184 Chronic kidney disease, stage 4 (severe): Secondary | ICD-10-CM | POA: Insufficient documentation

## 2017-11-27 DIAGNOSIS — D631 Anemia in chronic kidney disease: Secondary | ICD-10-CM | POA: Insufficient documentation

## 2017-11-29 ENCOUNTER — Encounter (HOSPITAL_COMMUNITY): Payer: Self-pay | Admitting: *Deleted

## 2017-11-29 NOTE — Progress Notes (Signed)
Called pt for pre-op call and she states "I'm not having that surgery and I've trying to cancel it on My Chart". I told her that she would need to call Dr. Nicole Cella office and let them know. She states "I don't know their number and I'm not able to write it down". I told her that I would call his office, but she should expect a call from them to verify that she is cancelling. I spoke with Zigmund Daniel, RN at Dr. Nicole Cella office and told her that pt was cancelling.

## 2017-11-29 NOTE — Progress Notes (Signed)
[name] at Dr. Nicole Cella office to see if pt had called her. She stated that the phones have been turned off for the weekend. She states she will call pt and then adjust the schedule.

## 2017-11-29 NOTE — Progress Notes (Signed)
[name] at Dr. Nicole Cella office at 4:50 pm because pt was still on OR schedule. She stated that she had talked to Dr. Jason Nest nurse, Catalina Antigua and he was supposed to call pt and tell her she has to have the surgery. I called Dr. Jason Nest office and Catalina Antigua had already left for the day and Amber the receptionist did not see any type of note in pt's chart as to whether he had called her. I called pt back, she did not answer and left her a message to call me back as soon as she got the message. She just returned my call and stated that she needs to reschedule her surgery. I told her to please call Zigmund Daniel at Dr. Nicole Cella office right now and tell her. She stated she has Kay's number.

## 2017-11-29 NOTE — Progress Notes (Signed)
Pt is still on the schedule, but with a later start time. I called pt back, she states she did not hear from [facility] and she states she is still not having surgery on Tuesday. She needs to reschedule because she has 2 other appointments for that day.

## 2017-12-03 ENCOUNTER — Other Ambulatory Visit: Payer: Self-pay | Admitting: Endocrinology

## 2017-12-03 ENCOUNTER — Encounter (HOSPITAL_COMMUNITY): Payer: Self-pay | Admitting: Certified Registered"

## 2017-12-03 ENCOUNTER — Ambulatory Visit (HOSPITAL_COMMUNITY): Admission: RE | Admit: 2017-12-03 | Payer: Medicare Other | Source: Ambulatory Visit | Admitting: Vascular Surgery

## 2017-12-03 SURGERY — ARTERIOVENOUS (AV) FISTULA CREATION
Anesthesia: Monitor Anesthesia Care | Laterality: Left

## 2017-12-03 MED ORDER — ONDANSETRON HCL 4 MG/2ML IJ SOLN
INTRAMUSCULAR | Status: AC
  Filled 2017-12-03: qty 2

## 2017-12-03 MED ORDER — FENTANYL CITRATE (PF) 250 MCG/5ML IJ SOLN
INTRAMUSCULAR | Status: AC
  Filled 2017-12-03: qty 5

## 2017-12-03 MED ORDER — HEPARIN SODIUM (PORCINE) 1000 UNIT/ML IJ SOLN
INTRAMUSCULAR | Status: AC
  Filled 2017-12-03: qty 2

## 2017-12-03 MED ORDER — MIDAZOLAM HCL 2 MG/2ML IJ SOLN
INTRAMUSCULAR | Status: AC
  Filled 2017-12-03: qty 2

## 2017-12-05 ENCOUNTER — Encounter (HOSPITAL_BASED_OUTPATIENT_CLINIC_OR_DEPARTMENT_OTHER): Payer: Medicare Other | Attending: Internal Medicine

## 2017-12-05 DIAGNOSIS — L97412 Non-pressure chronic ulcer of right heel and midfoot with fat layer exposed: Secondary | ICD-10-CM | POA: Insufficient documentation

## 2017-12-05 DIAGNOSIS — Z86718 Personal history of other venous thrombosis and embolism: Secondary | ICD-10-CM | POA: Diagnosis not present

## 2017-12-05 DIAGNOSIS — E10621 Type 1 diabetes mellitus with foot ulcer: Secondary | ICD-10-CM | POA: Insufficient documentation

## 2017-12-05 DIAGNOSIS — E1051 Type 1 diabetes mellitus with diabetic peripheral angiopathy without gangrene: Secondary | ICD-10-CM | POA: Diagnosis not present

## 2017-12-05 DIAGNOSIS — L97422 Non-pressure chronic ulcer of left heel and midfoot with fat layer exposed: Secondary | ICD-10-CM | POA: Insufficient documentation

## 2017-12-05 DIAGNOSIS — E1040 Type 1 diabetes mellitus with diabetic neuropathy, unspecified: Secondary | ICD-10-CM | POA: Insufficient documentation

## 2017-12-05 DIAGNOSIS — G4730 Sleep apnea, unspecified: Secondary | ICD-10-CM | POA: Insufficient documentation

## 2017-12-09 ENCOUNTER — Ambulatory Visit: Payer: Medicare Other | Admitting: Internal Medicine

## 2017-12-10 ENCOUNTER — Ambulatory Visit: Payer: Medicare Other | Admitting: Gynecology

## 2017-12-10 DIAGNOSIS — Z0289 Encounter for other administrative examinations: Secondary | ICD-10-CM

## 2017-12-12 DIAGNOSIS — E10621 Type 1 diabetes mellitus with foot ulcer: Secondary | ICD-10-CM | POA: Diagnosis not present

## 2017-12-17 ENCOUNTER — Encounter: Payer: Medicare Other | Attending: Endocrinology | Admitting: Nutrition

## 2017-12-17 DIAGNOSIS — E1029 Type 1 diabetes mellitus with other diabetic kidney complication: Secondary | ICD-10-CM

## 2017-12-17 DIAGNOSIS — R809 Proteinuria, unspecified: Secondary | ICD-10-CM

## 2017-12-17 DIAGNOSIS — Z713 Dietary counseling and surveillance: Secondary | ICD-10-CM | POA: Diagnosis not present

## 2017-12-17 DIAGNOSIS — E1065 Type 1 diabetes mellitus with hyperglycemia: Secondary | ICD-10-CM | POA: Insufficient documentation

## 2017-12-17 NOTE — Patient Instructions (Signed)
Bolus for a meals and snacks eaten.

## 2017-12-17 NOTE — Progress Notes (Signed)
Patient reports that she is bolusing for all meals, and putting in the blood sugar readings from her [place] sensor.  She is not however, bolusing for snacks.  She reports that she is not dropping low after doing a corrections bolus. CGM download shows average readings for last 2 weeks is 283.  CGM showed to Dr. Dwyane Dee, and basal rate adjustments were made to her pump, per his written instructions.   Basal rate:  2PM-8PM changed from 0.75 to 0.90.  No other changes made. Stressed the need to bolus for all food eaten, and discussed the importance of getting her blood sugars down for wound healing to occur.   She had no final questions.

## 2017-12-19 DIAGNOSIS — E10621 Type 1 diabetes mellitus with foot ulcer: Secondary | ICD-10-CM | POA: Diagnosis not present

## 2017-12-26 DIAGNOSIS — E10621 Type 1 diabetes mellitus with foot ulcer: Secondary | ICD-10-CM | POA: Diagnosis not present

## 2017-12-27 ENCOUNTER — Other Ambulatory Visit: Payer: Self-pay | Admitting: Endocrinology

## 2017-12-27 IMAGING — CR DG FOOT COMPLETE 3+V*R*
3 series · 3 of 3 positions shown · non-contrast
Comparison: 06/20/2016

CLINICAL DATA: Soft tissue wound with possible bony changes

EXAM:
RIGHT FOOT COMPLETE - 3+ VIEW

[t foot ap right]
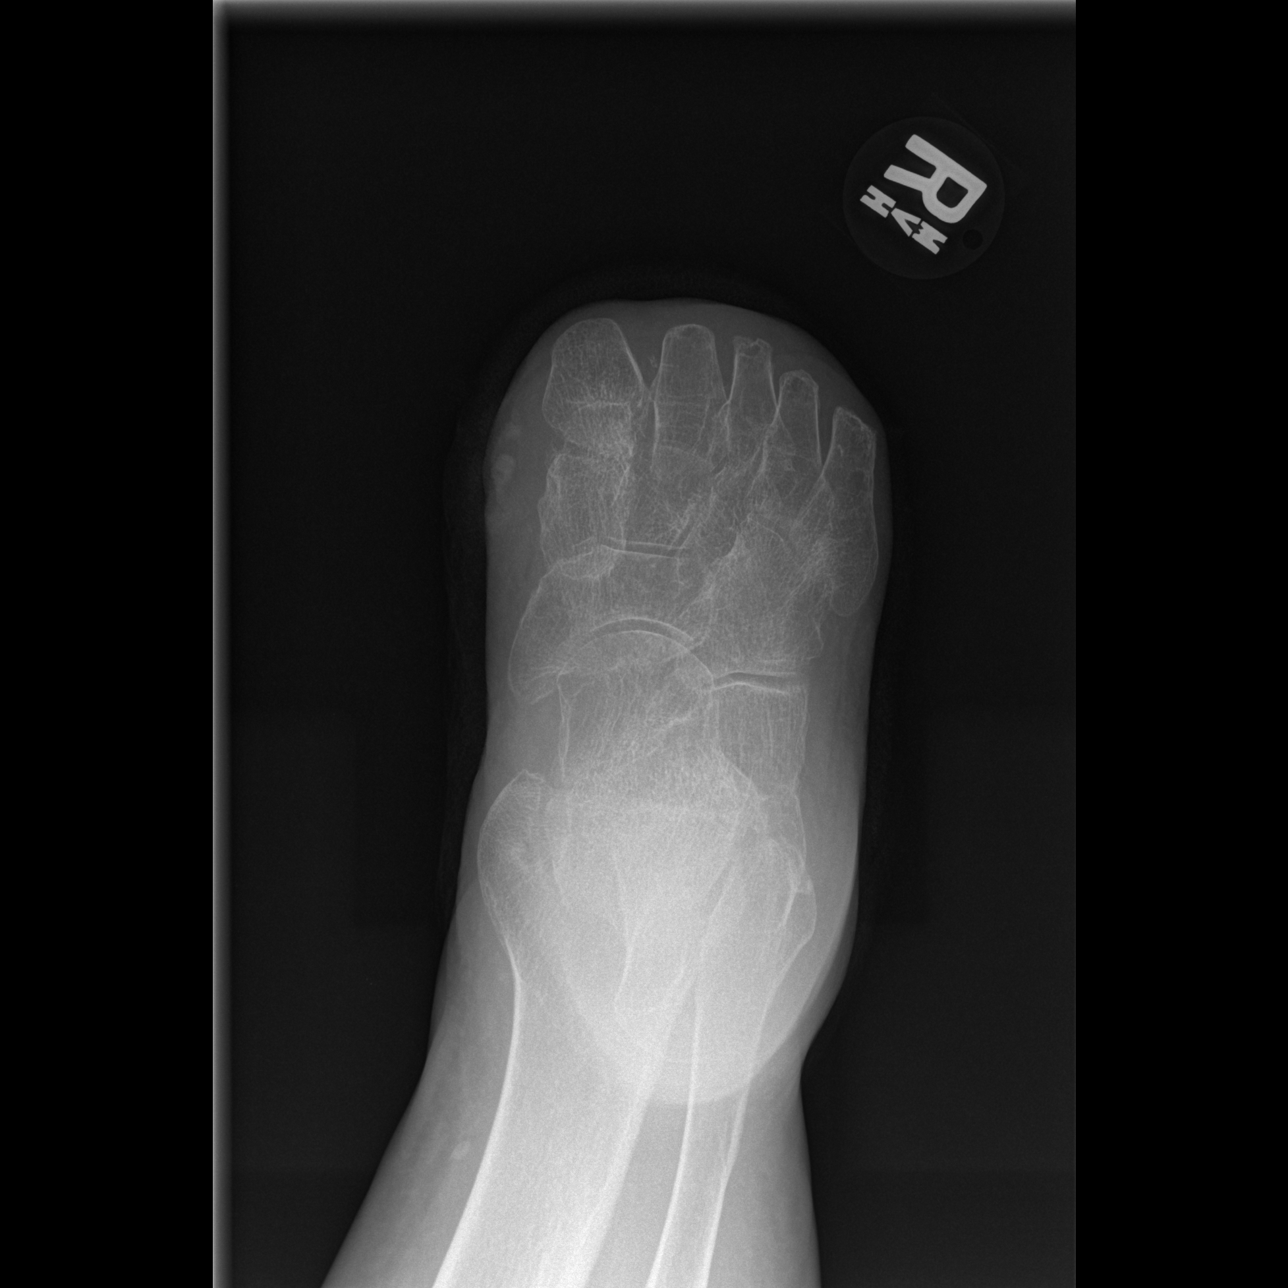

[t foot oblique right]
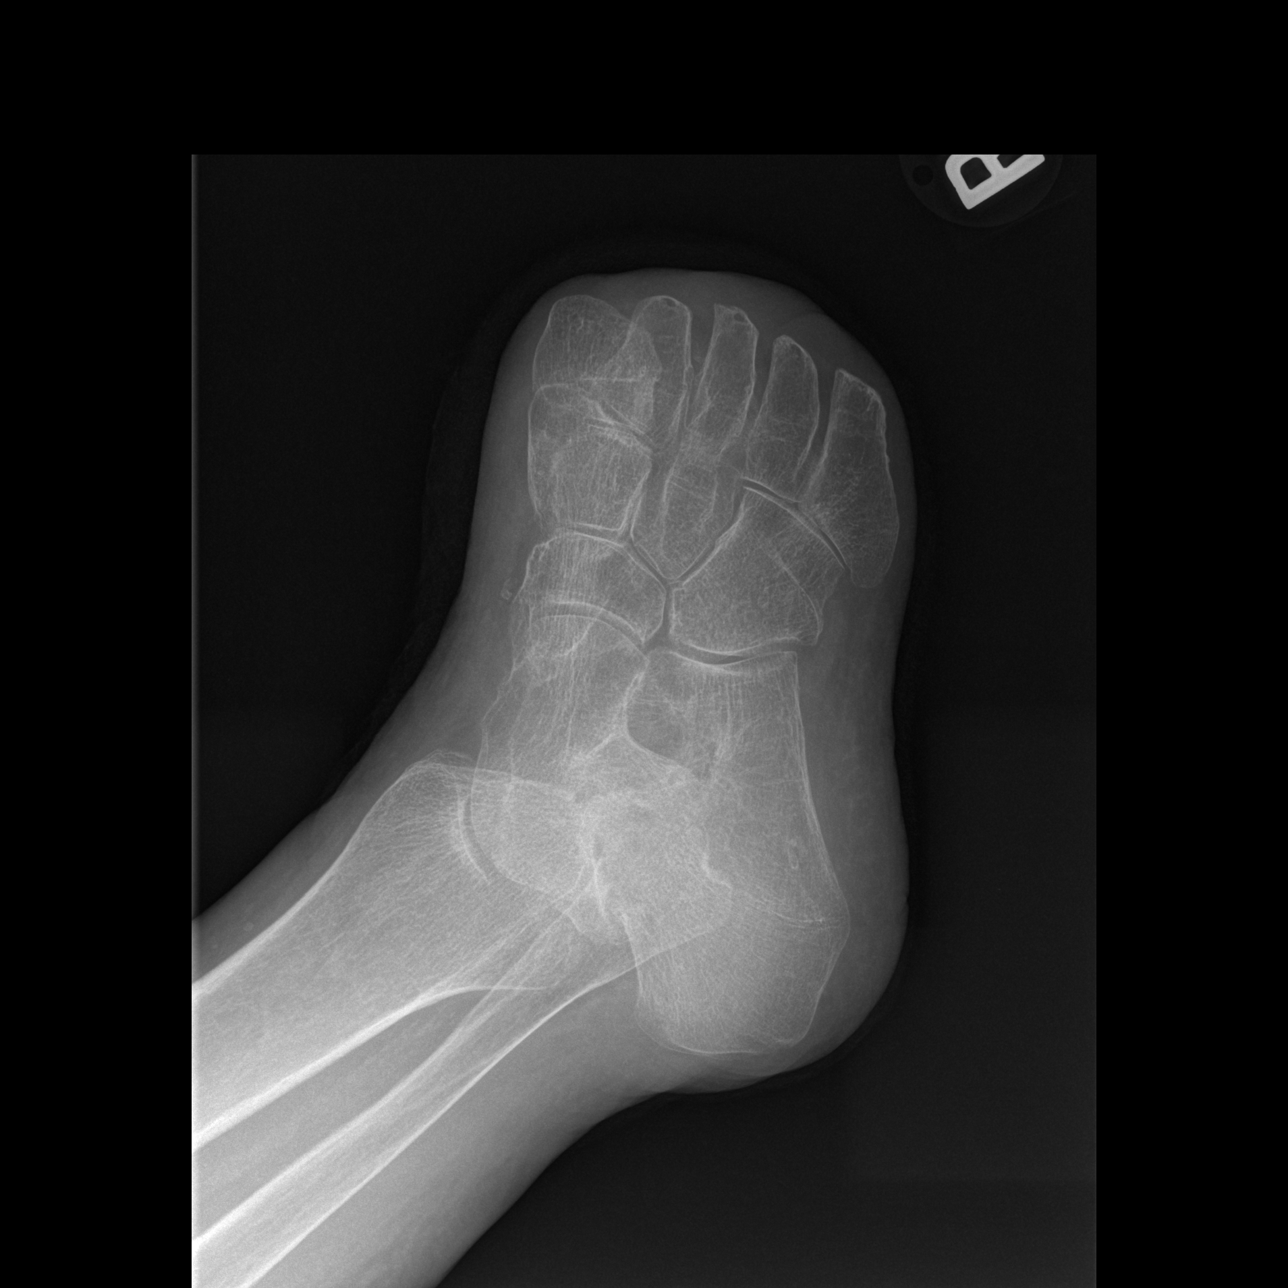

[t foot lat right]
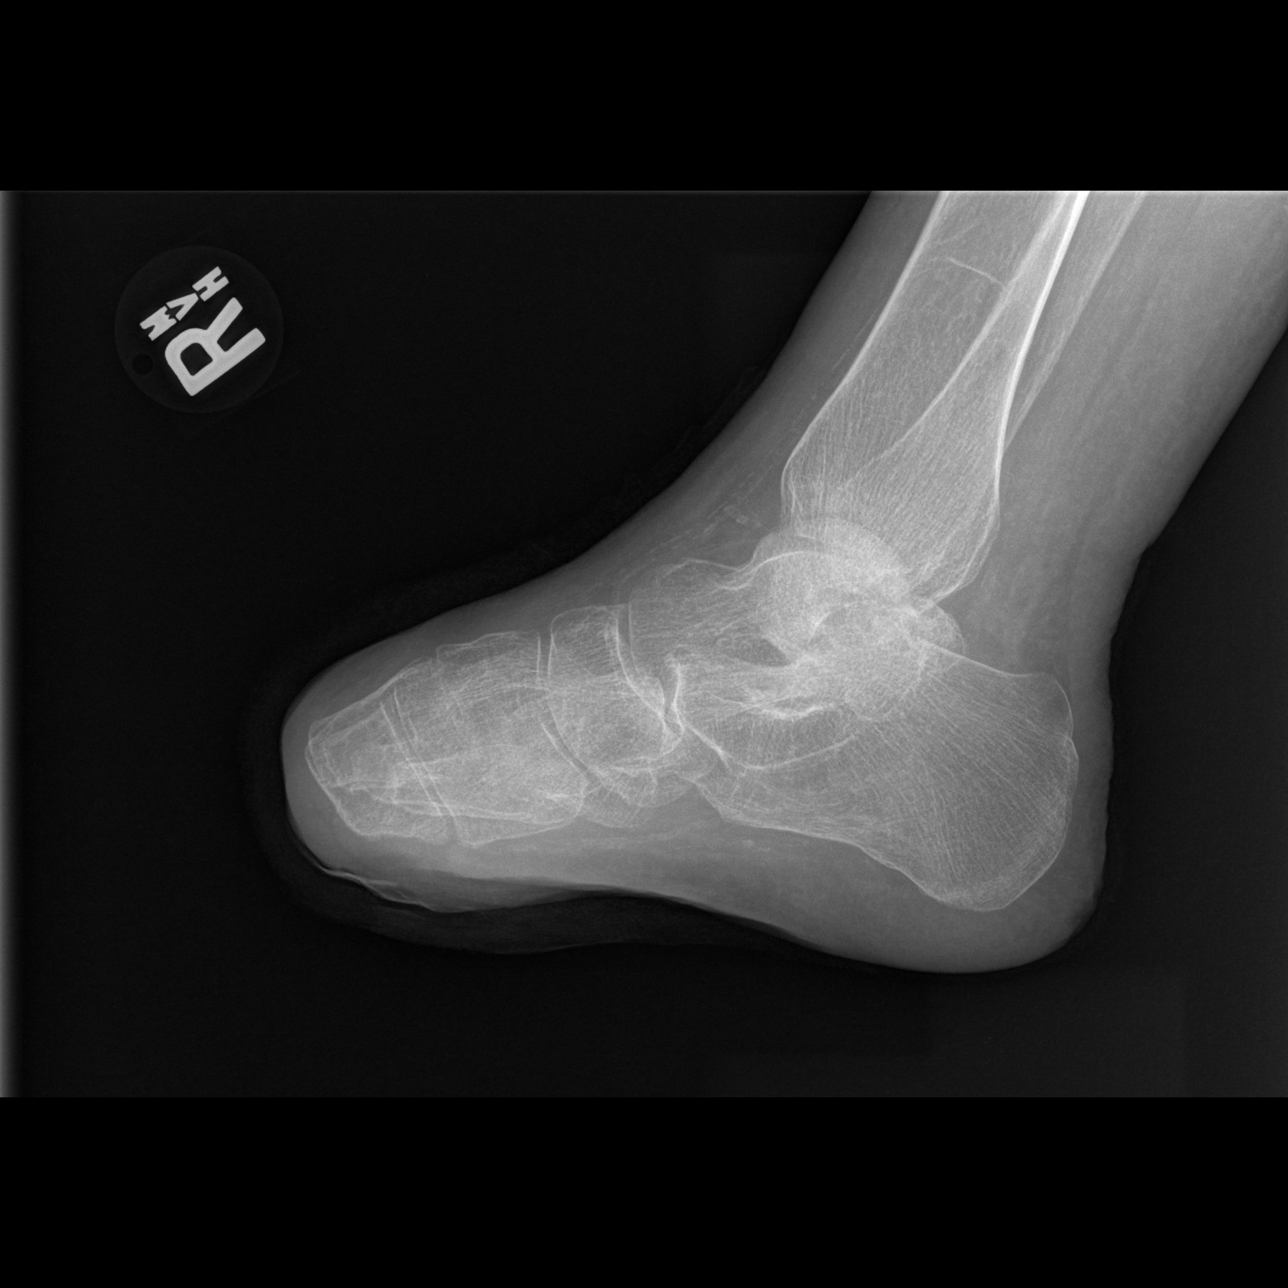

[3 of 3 positions shown; findings below may reference images not displayed]

FINDINGS: Transmetatarsal amputation is noted. No bony destruction is noted to
suggest osteomyelitis.
IMPRESSION: No evidence of osteomyelitis.

Postsurgical changes are seen. MRI would be more sensitive for
possible bony changes.

## 2017-12-31 ENCOUNTER — Ambulatory Visit (INDEPENDENT_AMBULATORY_CARE_PROVIDER_SITE_OTHER): Payer: Medicare Other | Admitting: Internal Medicine

## 2017-12-31 ENCOUNTER — Encounter: Payer: Self-pay | Admitting: Internal Medicine

## 2017-12-31 DIAGNOSIS — I70235 Atherosclerosis of native arteries of right leg with ulceration of other part of foot: Secondary | ICD-10-CM

## 2017-12-31 DIAGNOSIS — E11628 Type 2 diabetes mellitus with other skin complications: Secondary | ICD-10-CM

## 2017-12-31 DIAGNOSIS — L089 Local infection of the skin and subcutaneous tissue, unspecified: Secondary | ICD-10-CM | POA: Diagnosis not present

## 2017-12-31 NOTE — Progress Notes (Signed)
[place] for Infectious Disease  Patient Active Problem List   Diagnosis Date Noted  . Diabetic infection of left foot ([place]) 10/13/2014    Priority: High  . Status post transmetatarsal amputation of right foot ([place]) 09/26/2014    Priority: High  . Foot ulcer due to secondary DM ([place]) 05/09/2011    Priority: High  . Gangrene of left foot ([place]) 05/08/2017  . Osteomyelitis of left foot ([place]) 05/08/2017  . Diabetic foot infection ([place])   . Diabetic foot ulcer ([place]) 05/05/2017  . Cellulitis 05/04/2017  . Diabetes mellitus type 1 with peripheral artery disease ([place]) 01/16/2017  . Anxiety 03/23/2016  . Heart murmur 11/03/2015  . Acute renal failure superimposed on stage 4 chronic kidney disease ([place]) 05/31/2015  . Protein-calorie malnutrition, moderate ([place]) 10/18/2014  . History of DVT (deep vein thrombosis)   . Chronic anemia 10/13/2014  . DM type 1 causing renal disease ([place]) 06/13/2013  . CKD (chronic kidney disease), stage III ([place]) 06/12/2013  . Charcot foot due to diabetes mellitus ([place]) 05/09/2011  . Type 1 diabetes mellitus with neurological manifestations, uncontrolled ([place])   . Hypertension   . Hypercholesteremia     Patient's Medications  New Prescriptions   No medications on file  Previous Medications   ALBUTEROL (PROVENTIL HFA;VENTOLIN HFA) 108 (90 BASE) MCG/ACT INHALER    Inhale 2 puffs into the lungs every 6 (six) hours as needed for wheezing or shortness of breath. Reported on 04/15/2015   AMLODIPINE (NORVASC) 2.5 MG TABLET    TAKE 1 TABLET BY MOUTH EVERY DAY   AMLODIPINE (NORVASC) 5 MG TABLET    Take 1 tablet (5 mg total) by mouth daily.   ASPIRIN 81 MG TABLET    Take 81 mg by mouth every evening.    CALCITRIOL (ROCALTROL) 0.5 MCG CAPSULE    Take 1 capsule daily   CALCIUM-PHOSPHORUS-VITAMIN D (CALCIUM GUMMIES) 250-100-500 MG-MG-UNIT CHEW    Chew 2 each by mouth every evening.    CINNAMON PO    Take 1 tablet by mouth every evening.    CONTINUOUS  BLOOD GLUC RECEIVER (FREESTYLE LIBRE READER) DEVI    1 Device by Does not apply route as directed.   CONTINUOUS BLOOD GLUC SENSOR (FREESTYLE LIBRE SENSOR SYSTEM) MISC    Use 1 sensor every 10 days   FOLIC ACID (FOLVITE) 1 MG TABLET    Take 1 mg by mouth every evening.    FUROSEMIDE (LASIX) 40 MG TABLET    Take 40 mg by mouth daily as needed for fluid.    GLUCOSE BLOOD (CONTOUR NEXT TEST) TEST STRIP    USE AS INSTRUCTED TO CHECK BLOOD SUGAR 4 TIMES A DAY. DX E10.65   HUMALOG 100 UNIT/ML INJECTION    CALL MD IF <70, IF 151-200 =2UNITS,201-250 =4 UNITS,251-300 =6 UNITS, 301-350 =8 UNITS,351-400 =10 U   INSULIN PEN NEEDLE (PEN NEEDLES) 32G X 4 MM MISC    1 Device by Does not apply route 2 (two) times daily.   KETOROLAC (ACULAR) 0.5 % OPHTHALMIC SOLUTION    PLACE 1 DROP IN RIGHT EYE FOUR TIMES A DAY. START 1 WEEK PRIOR TO SURGERY   LORATADINE (CLARITIN) 10 MG TABLET    Take 1 tablet (10 mg total) by mouth daily.   NORETHINDRONE (AYGESTIN) 5 MG TABLET    Take by mouth.   ONDANSETRON (ZOFRAN-ODT) 4 MG DISINTEGRATING TABLET    DISSOLVE 1 TABLET ON TONGUE EVERY 8 HOURS AS NEEDED  FOR NAUSEA   PRAVASTATIN (PRAVACHOL) 40 MG TABLET    TAKE 1 TABLET BY MOUTH EVERY DAY   PYRIDOXINE (B-6) 100 MG TABLET    Take 100 mg by mouth daily. Reported on 04/15/2015   VITAMIN B-12 (V-R VITAMIN B-12) 500 MCG TABLET    Take by mouth.   VITAMIN C (ASCORBIC ACID) 500 MG TABLET    Take 500 mg by mouth daily.   VITAMIN D, ERGOCALCIFEROL, (DRISDOL) 50000 UNITS CAPS CAPSULE    TAKE ONE CAPSULE BY MOUTH ONE TIME PER WEEK  Modified Medications   No medications on file  Discontinued Medications   No medications on file    Subjective: Biviana is in for her routine follow-up visit.  Her daptomycin was stopped and her PICC was removed at the end of August.  She was supposed to stop taking her oral trimethoprim sulfamethoxazole and metronidazole at that same time but continued taking them until several days ago.  She has had no  problems tolerating her antibiotics.  She recently developed some superficial "blood blisters" on both feet after walking barefoot at her pool.  Review of Systems: Review of Systems  Constitutional: Negative for chills, diaphoresis and fever.  Gastrointestinal: Negative for abdominal pain, diarrhea, nausea and vomiting.  Neurological: Positive for sensory change.    Past Medical History:  Diagnosis Date  . Anemia   . Arthritis   . Bronchitis   . C. difficile diarrhea 09/26/2014  . Cataracts, both eyes   . Cellulitis of right foot 06/02/2014  . CKD (chronic kidney disease) stage 3, GFR 30-59 ml/min (HCC)    sees Dr Justin Mend- Stage IV  . Diabetic ulcer of right foot ([place]) 06/02/2014  . DVT (deep venous thrombosis) ([place]) 10/2014   one in each one in leg- PICC line , one in right and left arm  . H/O seasonal allergies   . Heart murmur    told once when she was pregnant- no furter mention- was in notes 11/2015- had Echo 8 /4/17  . History of blood transfusion   . Hypercholesteremia   . Hypertension   . Left foot infection   . Neuropathy    feet  . Neuropathy in diabetes ([place])   . Peripheral vascular disease ([place])   . Sleep apnea    not able to use CPAP  . Staphylococcus aureus bacteremia 10/2014  . Type 1 diabetes [facility])    onset age 7    Social History   Tobacco Use  . Smoking status: Former Smoker    Years: 15.00    Last attempt to quit: 05/16/2008    Years since quitting: 9.6  . Smokeless tobacco: Never Used  Substance Use Topics  . Alcohol use: Yes    Comment: Occasional  . Drug use: No    Family History  Problem Relation Age of Onset  . Hyperlipidemia Mother   . Dementia Mother     Allergies  Allergen Reactions  . Cleocin [Clindamycin Hcl] Diarrhea  . Lisinopril Other (See Comments)    Elevated potassium per pt report  . Amoxicillin Diarrhea, Nausea Only and Other (See Comments)    Has patient had a PCN reaction causing immediate rash, facial/tongue/throat  swelling, SOB or lightheadedness with hypotension: No Has patient had a PCN reaction causing severe rash involving mucus membranes or skin necrosis: No Has patient had a PCN reaction that required hospitalization: No Has patient had a PCN reaction occurring within the last 10 years: No If all of the  above answers are "NO", then may proceed with Cephalosporin use.   . Bactrim [Sulfamethoxazole-Trimethoprim] Diarrhea and Nausea Only    Objective: Vitals:   12/31/17 1448  BP: 126/75  Pulse: 88  Temp: 98.5 F (36.9 C)  Weight: 115 lb (52.2 kg)  Height: 5\' 2"  (1.575 m)   Body mass index is 21.03 kg/m.  Physical Exam  Constitutional:  She is in good spirits as usual.  Musculoskeletal:  She has some superficial dried blood on the bottom of both feet.  Wounds on her right foot.  Her left foot wounds continue to look much better over the past few months.  There is no evidence of any active infection.      Problem List Items Addressed This Visit      High   Diabetic infection of left foot ([place])    I will observe off of antibiotics for now and see her back in 6 weeks.  I encouraged her to be very very careful with foot care.          Michel Bickers, MD [facility] [phone] pager   [phone] cell 12/31/2017, 3:01 PM

## 2017-12-31 NOTE — Assessment & Plan Note (Signed)
I will observe off of antibiotics for now and see her back in 6 weeks.  I encouraged her to be very very careful with foot care.

## 2018-01-02 ENCOUNTER — Encounter (HOSPITAL_BASED_OUTPATIENT_CLINIC_OR_DEPARTMENT_OTHER): Payer: Medicare Other | Attending: Internal Medicine

## 2018-01-02 DIAGNOSIS — L97422 Non-pressure chronic ulcer of left heel and midfoot with fat layer exposed: Secondary | ICD-10-CM | POA: Insufficient documentation

## 2018-01-02 DIAGNOSIS — I10 Essential (primary) hypertension: Secondary | ICD-10-CM | POA: Insufficient documentation

## 2018-01-02 DIAGNOSIS — Z89422 Acquired absence of other left toe(s): Secondary | ICD-10-CM | POA: Insufficient documentation

## 2018-01-02 DIAGNOSIS — E1040 Type 1 diabetes mellitus with diabetic neuropathy, unspecified: Secondary | ICD-10-CM | POA: Insufficient documentation

## 2018-01-02 DIAGNOSIS — E10621 Type 1 diabetes mellitus with foot ulcer: Secondary | ICD-10-CM | POA: Diagnosis present

## 2018-01-02 DIAGNOSIS — E1051 Type 1 diabetes mellitus with diabetic peripheral angiopathy without gangrene: Secondary | ICD-10-CM | POA: Insufficient documentation

## 2018-01-02 DIAGNOSIS — G4730 Sleep apnea, unspecified: Secondary | ICD-10-CM | POA: Insufficient documentation

## 2018-01-02 DIAGNOSIS — L97412 Non-pressure chronic ulcer of right heel and midfoot with fat layer exposed: Secondary | ICD-10-CM | POA: Diagnosis not present

## 2018-01-02 DIAGNOSIS — L97522 Non-pressure chronic ulcer of other part of left foot with fat layer exposed: Secondary | ICD-10-CM | POA: Diagnosis not present

## 2018-01-02 DIAGNOSIS — Z86718 Personal history of other venous thrombosis and embolism: Secondary | ICD-10-CM | POA: Insufficient documentation

## 2018-01-04 ENCOUNTER — Other Ambulatory Visit: Payer: Self-pay | Admitting: Endocrinology

## 2018-01-10 DIAGNOSIS — E10621 Type 1 diabetes mellitus with foot ulcer: Secondary | ICD-10-CM | POA: Diagnosis not present

## 2018-01-13 ENCOUNTER — Telehealth: Payer: Self-pay | Admitting: Internal Medicine

## 2018-01-13 ENCOUNTER — Other Ambulatory Visit (INDEPENDENT_AMBULATORY_CARE_PROVIDER_SITE_OTHER): Payer: Medicare Other

## 2018-01-13 DIAGNOSIS — E1065 Type 1 diabetes mellitus with hyperglycemia: Secondary | ICD-10-CM | POA: Diagnosis not present

## 2018-01-13 LAB — BASIC METABOLIC PANEL
BUN: 69 mg/dL — ABNORMAL HIGH (ref 6–23)
CALCIUM: 7 mg/dL — AB (ref 8.4–10.5)
CO2: 19 mEq/L (ref 19–32)
CREATININE: 3.47 mg/dL — AB (ref 0.40–1.20)
Chloride: 102 mEq/L (ref 96–112)
GFR: 15.08 mL/min — AB (ref >60.00)
Glucose, Bld: 548 mg/dL (ref 70–99)
Potassium: 5.3 mEq/L — ABNORMAL HIGH (ref 3.5–5.1)
Sodium: 130 mEq/L — ABNORMAL LOW (ref 135–145)

## 2018-01-13 NOTE — Telephone Encounter (Signed)
Called by answering service about critical lab  Serum glucose 548 and potassium 5.3  Called patient.  Her insulin pump had run out of insulin this morning .  She has refilled it and  Most recent CbG  At 5:30 pm was 250.  Advised her to take one dose of kayexelate  To managed borderline hyperkalemia and follow up with dr Dwyane Dee as planned on 10/16.

## 2018-01-13 NOTE — Telephone Encounter (Signed)
Thanks, will follow

## 2018-01-14 ENCOUNTER — Telehealth: Payer: Self-pay | Admitting: Endocrinology

## 2018-01-14 ENCOUNTER — Other Ambulatory Visit: Payer: Self-pay | Admitting: Endocrinology

## 2018-01-14 DIAGNOSIS — R5381 Other malaise: Secondary | ICD-10-CM

## 2018-01-14 DIAGNOSIS — Z1231 Encounter for screening mammogram for malignant neoplasm of breast: Secondary | ICD-10-CM

## 2018-01-14 LAB — HEMOGLOBIN A1C: HEMOGLOBIN A1C: 10.1 % — AB (ref 4.6–6.5)

## 2018-01-14 NOTE — Telephone Encounter (Signed)
Patient is requesting Dr. Dwyane Dee send a diagnostic order for patient to have a mammogram due to dense tissue sent to [facility] fax# [phone]

## 2018-01-14 NOTE — Telephone Encounter (Signed)
Called and spoke to [facility] and she stated she gave critical lab result to the MD on call last night

## 2018-01-14 NOTE — Telephone Encounter (Signed)
Per [facility] "Caller states she has critical lab result." [facility] [phone]

## 2018-01-14 NOTE — Telephone Encounter (Signed)
Please advise 

## 2018-01-14 NOTE — Telephone Encounter (Signed)
This has to be done by her PCP

## 2018-01-15 ENCOUNTER — Encounter: Payer: Self-pay | Admitting: Endocrinology

## 2018-01-15 ENCOUNTER — Ambulatory Visit (INDEPENDENT_AMBULATORY_CARE_PROVIDER_SITE_OTHER): Payer: Medicare Other | Admitting: Endocrinology

## 2018-01-15 VITALS — BP 124/82 | HR 92 | Ht 62.0 in | Wt 123.0 lb

## 2018-01-15 DIAGNOSIS — I70235 Atherosclerosis of native arteries of right leg with ulceration of other part of foot: Secondary | ICD-10-CM

## 2018-01-15 DIAGNOSIS — E1165 Type 2 diabetes mellitus with hyperglycemia: Secondary | ICD-10-CM | POA: Diagnosis not present

## 2018-01-15 DIAGNOSIS — E875 Hyperkalemia: Secondary | ICD-10-CM | POA: Diagnosis not present

## 2018-01-15 NOTE — Patient Instructions (Signed)
Check blood sugars on waking up 3 days a week  Also check blood sugars about 2 hours after meals and do this after different meals by rotation  Recommended blood sugar levels on waking up are 90-130 and about 2 hours after meal is 130-160  Please bring your blood sugar monitor to each visit, thank you   

## 2018-01-15 NOTE — Progress Notes (Signed)
Patient ID: [name], female   DOB: Oct 02, 1970, 47 y.o.   MRN: [id number]   Reason for Appointment: follow-up of diabetes  History of Present Illness   Diagnosis: Type 1 DIABETES MELITUS  Previous history: She has had long-standing poorly controlled diabetes and typically has poor compliance with self care measures despite periodic diabetes education and periodic followup  INSULIN PUMP regimen:   Currently using MEDTRONIC 723 model   Basal rate midnight = 0.6, 3 AM = 0.5, 6 AM = 0.4, 11 AM = 0.3, 2 PM = 0.9 and 8 PM = 0.75   Carb coverage 1: 20 g and correction 1: 70 during the day and 80 from 10 PM-8 AM Glucose target 150, active insulin 5 hours         Recent history:   Her last A1c was 10.8 in June and and now 10.1   Current blood sugar patterns, difficulties with management and problems identified:   Her blood sugars are recently worse than on the last visit when she was having 58% of her readings above 180 and now it is 70%  Blood sugars are extremely labile at all different times based on her compliance with diet, bolusing and pump management  OVERNIGHT blood sugars are usually significantly high although may come down to variable extent if she takes a correction bolus late at night including a couple of low blood sugars  If she gets a low blood sugar during the night she will suspend her pump for several hours before going to sleep resulting in very high sugars the next day  She is again afraid of hypoglycemia and will not bolus adequately for meals especially with her first meal in the early afternoon  Also does not bolus consistently for high readings with occasionally no boluses all day long  On an average HIGHEST blood sugars are in the afternoons between 2-6 PM  MEDIAN reading is the highest around 3 PM  She has had hypoglycemia as above on 3/9 following correction doses and minimal mild occasional low sugars in the evenings  Today she  had a smoothie with yogurt and a vegetarian burger but her blood sugar went up 150 points because she did not bolus adequately  Also not always bolusing before eating   GLUCOSE readings from insulin pump and freestyle Libre download:  CGM use % of time  90  Average and SD  267+/-128  Time in range       28 %  % Time Above 180  70  % Time above 250  48  % Time Below target  2   AVERAGE blood sugar at any given time of the day by CGM ranges between 239 after 6 a.m. and 307 after 4 p.m.   Meals: eating at variable times and also having snacks periodically.  Her daily carbohydrate intake is quite variable Physical activity: exercise: none         Dietician visit: Most recent:?          Complications: are: Neuropathy, nephropathy, diabetic foot ulcer      Wt Readings from Last 3 Encounters:  01/15/18 123 lb (55.8 kg)  12/31/17 115 lb (52.2 kg)  11/18/17 124 lb (56.2 kg)   Lab Results  Component Value Date   HGBA1C 10.1 (H) 01/13/2018   HGBA1C 10.8 (H) 09/26/2017   HGBA1C 10.6 04/08/2017   Lab Results  Component Value Date   MICROALBUR 304.2 (H) 11/11/2017   LDLCALC 78 05/02/2016  CREATININE 3.47 (H) 01/13/2018   Lab on 01/13/2018  Component Date Value Ref Range Status  . Sodium 01/13/2018 130* 135 - 145 mEq/L Final  . Potassium 01/13/2018 5.3* 3.5 - 5.1 mEq/L Final  . Chloride 01/13/2018 102  96 - 112 mEq/L Final  . CO2 01/13/2018 19  19 - 32 mEq/L Final  . Glucose, Bld 01/13/2018 548* 70 - 99 mg/dL Final  . BUN 01/13/2018 69* 6 - 23 mg/dL Final  . Creatinine, Ser 01/13/2018 3.47* 0.40 - 1.20 mg/dL Final  . Calcium 01/13/2018 7.0* 8.4 - 10.5 mg/dL Final  . GFR 01/13/2018 15.08* >60.00 mL/min Final  . Hgb A1c MFr Bld 01/13/2018 10.1* 4.6 - 6.5 % Final   Glycemic Control Guidelines for People with Diabetes:Non Diabetic:  <6%Goal of Therapy: <7%Additional Action Suggested:  >8%     Other problems addressed:: See review of systems   Allergies as of 01/15/2018       Reactions   Cleocin [clindamycin Hcl] Diarrhea   Lisinopril Other (See Comments)   Elevated potassium per pt report   Amoxicillin Diarrhea, Nausea Only, Other (See Comments)   Has patient had a PCN reaction causing immediate rash, facial/tongue/throat swelling, SOB or lightheadedness with hypotension: No Has patient had a PCN reaction causing severe rash involving mucus membranes or skin necrosis: No Has patient had a PCN reaction that required hospitalization: No Has patient had a PCN reaction occurring within the last 10 years: No If all of the above answers are "NO", then may proceed with Cephalosporin use.   Bactrim [sulfamethoxazole-trimethoprim] Diarrhea, Nausea Only      Medication List        Accurate as of 01/15/18  8:59 PM. Always use your most recent med list.          albuterol 108 (90 Base) MCG/ACT inhaler Commonly known as:  PROVENTIL HFA;VENTOLIN HFA Inhale 2 puffs into the lungs every 6 (six) hours as needed for wheezing or shortness of breath. Reported on 04/15/2015   amLODipine 5 MG tablet Commonly known as:  NORVASC Take 1 tablet (5 mg total) by mouth daily.   amLODipine 2.5 MG tablet Commonly known as:  NORVASC TAKE 1 TABLET BY MOUTH EVERY DAY   aspirin 81 MG tablet Take 81 mg by mouth every evening.   calcitRIOL 0.5 MCG capsule Commonly known as:  ROCALTROL Take 1 capsule daily   CALCIUM GUMMIES 250-100-500 MG-MG-UNIT Chew Generic drug:  Calcium-Phosphorus-Vitamin D Chew 2 each by mouth every evening.   CINNAMON PO Take 1 tablet by mouth every evening.   folic acid 1 MG tablet Commonly known as:  FOLVITE Take 1 mg by mouth every evening.   FREESTYLE LIBRE READER Devi 1 Device by Does not apply route as directed.   FREESTYLE LIBRE SENSOR SYSTEM Misc Use 1 sensor every 10 days   furosemide 40 MG tablet Commonly known as:  LASIX Take 40 mg by mouth daily as needed for fluid.   glucose blood test strip USE AS INSTRUCTED TO CHECK BLOOD  SUGAR 4 TIMES A DAY. DX E10.65   HUMALOG 100 UNIT/ML injection Generic drug:  insulin lispro CALL MD IF <70, IF 151-200 =2UNITS,201-250 =4 UNITS,251-300 =6 UNITS, 301-350 =8 UNITS,351-400 =10 U   ketorolac 0.5 % ophthalmic solution Commonly known as:  ACULAR PLACE 1 DROP IN RIGHT EYE FOUR TIMES A DAY. START 1 WEEK PRIOR TO SURGERY   loratadine 10 MG tablet Commonly known as:  CLARITIN Take 1 tablet (10 mg total) by mouth  daily.   norethindrone 5 MG tablet Commonly known as:  AYGESTIN Take by mouth.   ondansetron 4 MG disintegrating tablet Commonly known as:  ZOFRAN-ODT DISSOLVE 1 TABLET ON TONGUE EVERY 8 HOURS AS NEEDED FOR NAUSEA   Pen Needles 32G X 4 MM Misc 1 Device by Does not apply route 2 (two) times daily.   pravastatin 40 MG tablet Commonly known as:  PRAVACHOL TAKE 1 TABLET BY MOUTH EVERY DAY   pyridoxine 100 MG tablet Commonly known as:  B-6 Take 100 mg by mouth daily. Reported on 04/15/2015   V-R VITAMIN B-12 500 MCG tablet Generic drug:  vitamin B-12 Take by mouth.   vitamin C 500 MG tablet Commonly known as:  ASCORBIC ACID Take 500 mg by mouth daily.   Vitamin D (Ergocalciferol) 50000 units Caps capsule Commonly known as:  DRISDOL TAKE ONE CAPSULE BY MOUTH ONE TIME PER WEEK       Allergies:  Allergies  Allergen Reactions  . Cleocin [Clindamycin Hcl] Diarrhea  . Lisinopril Other (See Comments)    Elevated potassium per pt report  . Amoxicillin Diarrhea, Nausea Only and Other (See Comments)    Has patient had a PCN reaction causing immediate rash, facial/tongue/throat swelling, SOB or lightheadedness with hypotension: No Has patient had a PCN reaction causing severe rash involving mucus membranes or skin necrosis: No Has patient had a PCN reaction that required hospitalization: No Has patient had a PCN reaction occurring within the last 10 years: No If all of the above answers are "NO", then may proceed with Cephalosporin use.   . Bactrim  [Sulfamethoxazole-Trimethoprim] Diarrhea and Nausea Only    Past Medical History:  Diagnosis Date  . Anemia   . Arthritis   . Bronchitis   . C. difficile diarrhea 09/26/2014  . Cataracts, both eyes   . Cellulitis of right foot 06/02/2014  . CKD (chronic kidney disease) stage 3, GFR 30-59 ml/min (HCC)    sees Dr Justin Mend- Stage IV  . Diabetic ulcer of right foot ([place]) 06/02/2014  . DVT (deep venous thrombosis) ([place]) 10/2014   one in each one in leg- PICC line , one in right and left arm  . H/O seasonal allergies   . Heart murmur    told once when she was pregnant- no furter mention- was in notes 11/2015- had Echo 8 /4/17  . History of blood transfusion   . Hypercholesteremia   . Hypertension   . Left foot infection   . Neuropathy    feet  . Neuropathy in diabetes ([place])   . Peripheral vascular disease ([place])   . Sleep apnea    not able to use CPAP  . Staphylococcus aureus bacteremia 10/2014  . Type 1 diabetes ([place])    onset age 47    Past Surgical History:  Procedure Laterality Date  . AMPUTATION TOE Left 07/24/2015   5th toe and Hallux amputation  . CESAREAN SECTION    . EXOSTECTECTOMY TOE Left 04/11/2016   Procedure: EXOSTECTECTOMY CUBOID AND 5TH METATARSAL AND PLACEMENT OF ANTIBIOTIC BEADS;  Surgeon: Edrick Kins, DPM;  Location: [place];  Service: Podiatry;  Laterality: Left;  . EYE SURGERY Bilateral    [place]  . FEMORAL-POPLITEAL BYPASS GRAFT Left 05/03/2015  . FOOT AMPUTATION THROUGH METATARSAL Right 2016  . hemrrhoidectomy    . I&D EXTREMITY Right 06/10/2014   Procedure: IRRIGATION AND DEBRIDEMENT Right Foot;  Surgeon: Newt Minion, MD;  Location: [place];  Service: Orthopedics;  Laterality: Right;  .  INCISION AND DRAINAGE Left 04/11/2016   Procedure: INCISION AND DRAINAGE A DEEP COMPLICATED WOUND OF LEFT FOOT;  Surgeon: Edrick Kins, DPM;  Location: [place];  Service: Podiatry;  Laterality: Left;  . IR FLUORO GUIDE CV LINE RIGHT  09/24/2017  . IR REMOVAL TUN CV CATH W/O FL   11/22/2017  . IR US GUIDE VASC ACCESS RIGHT  09/24/2017  . IRRIGATION AND DEBRIDEMENT ABSCESS Left 05/06/2017   Procedure: IRRIGATION AND DEBRIDEMENT ABSCESS LEFT FOOT;  Surgeon: Edrick Kins, DPM;  Location: [facility];  Service: Podiatry;  Laterality: Left;  . PERIPHERALLY INSERTED CENTRAL CATHETER INSERTION    . SKIN GRAFT Right 06/15/2014  . SKIN GRAFT Left 06/2015   foot   . SKIN SPLIT GRAFT Right 06/15/2014   Procedure: SPLIT THICKNESS SKIN GRAFT RIGHT FOOT;  Surgeon: Newt Minion, MD;  Location: [place];  Service: Orthopedics;  Laterality: Right;  . TRANSMETATARSAL AMPUTATION Right   . TRANSMETATARSAL AMPUTATION Right 09/11/2014    Family History  Problem Relation Age of Onset  . Hyperlipidemia Mother   . Dementia Mother     Social History:  reports that she quit smoking about 9 years ago. She quit after 15.00 years of use. She has never used smokeless tobacco. She reports that she drinks alcohol. She reports that she does not use drugs.  Review of Systems:   Hypertension/CKD:     Hypertension  Treated with amlodipine 2.5 mg, followed by nephrologist  BP Readings from Last 3 Encounters:  01/15/18 124/82  12/31/17 126/75  11/26/17 (!) 155/86   She tends to have high potassium at times, recent labs were done with a very high blood sugar  Lab Results  Component Value Date   K 5.3 (H) 01/13/2018   Renal function is consistently worse, she says she is trying to restrict protein  Lab Results  Component Value Date   CREATININE 3.47 (H) 01/13/2018   CREATININE 3.87 (H) 11/11/2017   CREATININE 3.92 (H) 11/11/2017    She is taking calcitriol from nephrologist  Also has vitamin D deficiency   Lab Results  Component Value Date   CALCIUM 7.0 (L) 01/13/2018   PHOS 3.5 08/18/2014     Lipids: Has been  treated with pravastatin Triglycerides are high on the last measurement LDL as follows   Lab Results  Component Value Date   CHOL 176 09/26/2017   HDL 22.60 (L)  09/26/2017   LDLCALC 78 05/02/2016   LDLDIRECT 78.0 09/26/2017   TRIG (H) 09/26/2017    465.0 Triglyceride is over 400; calculations on Lipids are invalid.   CHOLHDL 8 09/26/2017      Chronic anemia, followed by nephrologist and PCP:  Lab Results  Component Value Date   WBC 4.6 11/11/2017   HGB 10.8 (L) 11/26/2017   HCT 29.0 (L) 11/11/2017   MCV 86.6 11/11/2017   PLT 360 11/11/2017    She has a Charcot foot And has had amputation of Left fifth toe       Examination:   BP 124/82   Pulse 92   Ht 5\' 2"  (1.575 m)   Wt 123 lb (55.8 kg)   LMP 12/21/2017   SpO2 99%   BMI 22.50 kg/m   Body mass index is 22.5 kg/m.      ASSESSMENT/ PLAN:   Diabetes type 1 with poor control and multiple complications    Her diabetes is persistently poorly controlled mostly from noncompliance with all measures of self-care  See history  of present illness for detailed discussion of current diabetes management, blood sugar patterns and problems identified  A1c is still over 10%  Blood sugars are still poorly controlled because of her inadequate bolusing for her carbohydrate intake, likely inadequate basal rate especially in the midday and afternoon but also from her suspending her pump for prolonged periods of time when she has a low blood sugar She does periodically tend to have hypoglycemia which is mostly related to correction doses for high readings  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: followed by nephrologist    PLAN:     Showed the patient how to use the temporary basal feature which she can reduce her basal rate to a minimum amount of 1% for a couple of hours when she has hypoglycemia instead of suspending completely for several hours  Correction factor will be changed to 1: 90 overnight and 1: 80 during the day  Her basal rate will be increased up to 0.4 at 11 AM and 0.9 at 12:30 PM  She needs to bolus consistently with every carbohydrate  intake regardless of blood sugar  She will use calorie Edison Pace to calculate her carbohydrates more accurately  She does need more diabetes education but she usually does not show up for her appointments  Avoid simple sugars and have more balanced foods with some protein  Patient Instructions  Check blood sugars on waking up 3 days a week  Also check blood sugars about 2 hours after meals and do this after different meals by rotation  Recommended blood sugar levels on waking up are 90-130 and about 2 hours after meal is 130-160  Please bring your blood sugar monitor to each visit, thank you      Counseling time on subjects discussed in assessment and plan sections is over 50% of today's 25 minute visit   Elayne Snare 01/15/2018, 8:59 PM

## 2018-01-22 ENCOUNTER — Other Ambulatory Visit: Payer: Self-pay | Admitting: Endocrinology

## 2018-01-24 ENCOUNTER — Other Ambulatory Visit: Payer: Self-pay | Admitting: Endocrinology

## 2018-02-07 ENCOUNTER — Telehealth: Payer: Self-pay | Admitting: Endocrinology

## 2018-02-07 NOTE — Telephone Encounter (Signed)
Patient called to advise that RX should be sent to [facility] on [address].  Also thought her RX was for Novalog not Humalog.

## 2018-02-07 NOTE — Telephone Encounter (Signed)
Brief Nutrition Note Patient called reporting that she is now having more problems with low blood sugar now that her insulin has been adjusted and requested an urgent appointment with myself. Called patient's cell and she was not available.  Left message for her to call the Nutrition office [phone] to be scheduled or for questions regarding this.  Antonieta Iba, RD, LDN, CDE

## 2018-02-07 NOTE — Telephone Encounter (Signed)
Patient is requesting an urgent appointment with Antonieta Iba regarding her insulin and diet.  Per patient her insulin was adjusted and now her diet and insulin are not working well together and she is having low BS.

## 2018-02-10 ENCOUNTER — Other Ambulatory Visit (HOSPITAL_COMMUNITY)
Admission: RE | Admit: 2018-02-10 | Discharge: 2018-02-10 | Disposition: A | Discharge status: Home / Self Care | Payer: Medicare Other | Source: Other Acute Inpatient Hospital | Attending: Internal Medicine | Admitting: Internal Medicine

## 2018-02-10 ENCOUNTER — Encounter (HOSPITAL_BASED_OUTPATIENT_CLINIC_OR_DEPARTMENT_OTHER): Payer: Self-pay

## 2018-02-10 ENCOUNTER — Encounter (HOSPITAL_BASED_OUTPATIENT_CLINIC_OR_DEPARTMENT_OTHER): Payer: Medicare Other | Attending: Internal Medicine

## 2018-02-10 DIAGNOSIS — I10 Essential (primary) hypertension: Secondary | ICD-10-CM | POA: Insufficient documentation

## 2018-02-10 DIAGNOSIS — Z86718 Personal history of other venous thrombosis and embolism: Secondary | ICD-10-CM | POA: Diagnosis not present

## 2018-02-10 DIAGNOSIS — L97511 Non-pressure chronic ulcer of other part of right foot limited to breakdown of skin: Secondary | ICD-10-CM | POA: Diagnosis present

## 2018-02-10 DIAGNOSIS — E1051 Type 1 diabetes mellitus with diabetic peripheral angiopathy without gangrene: Secondary | ICD-10-CM | POA: Insufficient documentation

## 2018-02-10 DIAGNOSIS — L97512 Non-pressure chronic ulcer of other part of right foot with fat layer exposed: Secondary | ICD-10-CM | POA: Diagnosis not present

## 2018-02-10 DIAGNOSIS — E10621 Type 1 diabetes mellitus with foot ulcer: Secondary | ICD-10-CM | POA: Insufficient documentation

## 2018-02-10 DIAGNOSIS — B961 Klebsiella pneumoniae [K. pneumoniae] as the cause of diseases classified elsewhere: Secondary | ICD-10-CM | POA: Diagnosis not present

## 2018-02-10 DIAGNOSIS — E1040 Type 1 diabetes mellitus with diabetic neuropathy, unspecified: Secondary | ICD-10-CM | POA: Diagnosis not present

## 2018-02-10 DIAGNOSIS — L97524 Non-pressure chronic ulcer of other part of left foot with necrosis of bone: Secondary | ICD-10-CM | POA: Diagnosis present

## 2018-02-10 DIAGNOSIS — L97522 Non-pressure chronic ulcer of other part of left foot with fat layer exposed: Secondary | ICD-10-CM | POA: Diagnosis not present

## 2018-02-10 DIAGNOSIS — B964 Proteus (mirabilis) (morganii) as the cause of diseases classified elsewhere: Secondary | ICD-10-CM | POA: Insufficient documentation

## 2018-02-10 DIAGNOSIS — G4730 Sleep apnea, unspecified: Secondary | ICD-10-CM | POA: Insufficient documentation

## 2018-02-11 ENCOUNTER — Ambulatory Visit: Payer: Medicare Other | Admitting: Internal Medicine

## 2018-02-13 LAB — AEROBIC CULTURE  (SUPERFICIAL SPECIMEN)

## 2018-02-13 LAB — AEROBIC CULTURE W GRAM STAIN (SUPERFICIAL SPECIMEN)

## 2018-02-17 ENCOUNTER — Other Ambulatory Visit: Payer: Self-pay | Admitting: Endocrinology

## 2018-02-17 DIAGNOSIS — E10621 Type 1 diabetes mellitus with foot ulcer: Secondary | ICD-10-CM | POA: Diagnosis not present

## 2018-02-24 DIAGNOSIS — E10621 Type 1 diabetes mellitus with foot ulcer: Secondary | ICD-10-CM | POA: Diagnosis not present

## 2018-02-24 IMAGING — CR DG ANKLE COMPLETE 3+V*R*
3 series · 3 of 3 positions shown · non-contrast
Comparison: None.

CLINICAL DATA: Fell in gym 5 days ago, with persistent lower
extremity pain.

EXAM:
RIGHT ANKLE - COMPLETE 3+ VIEW

[t ankle joint ap right]
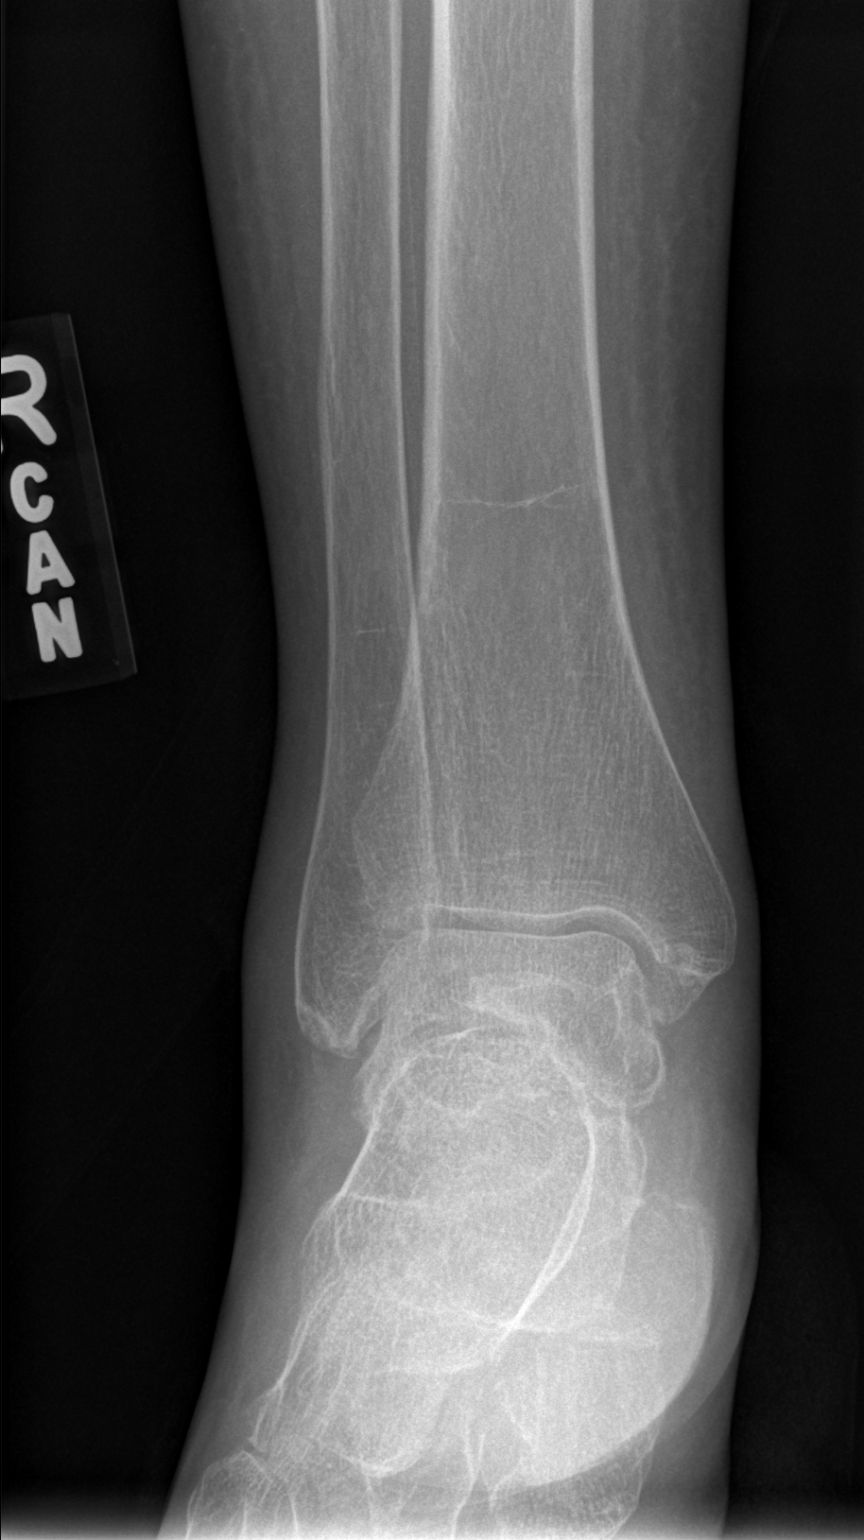

[t ankle joint oblique right]
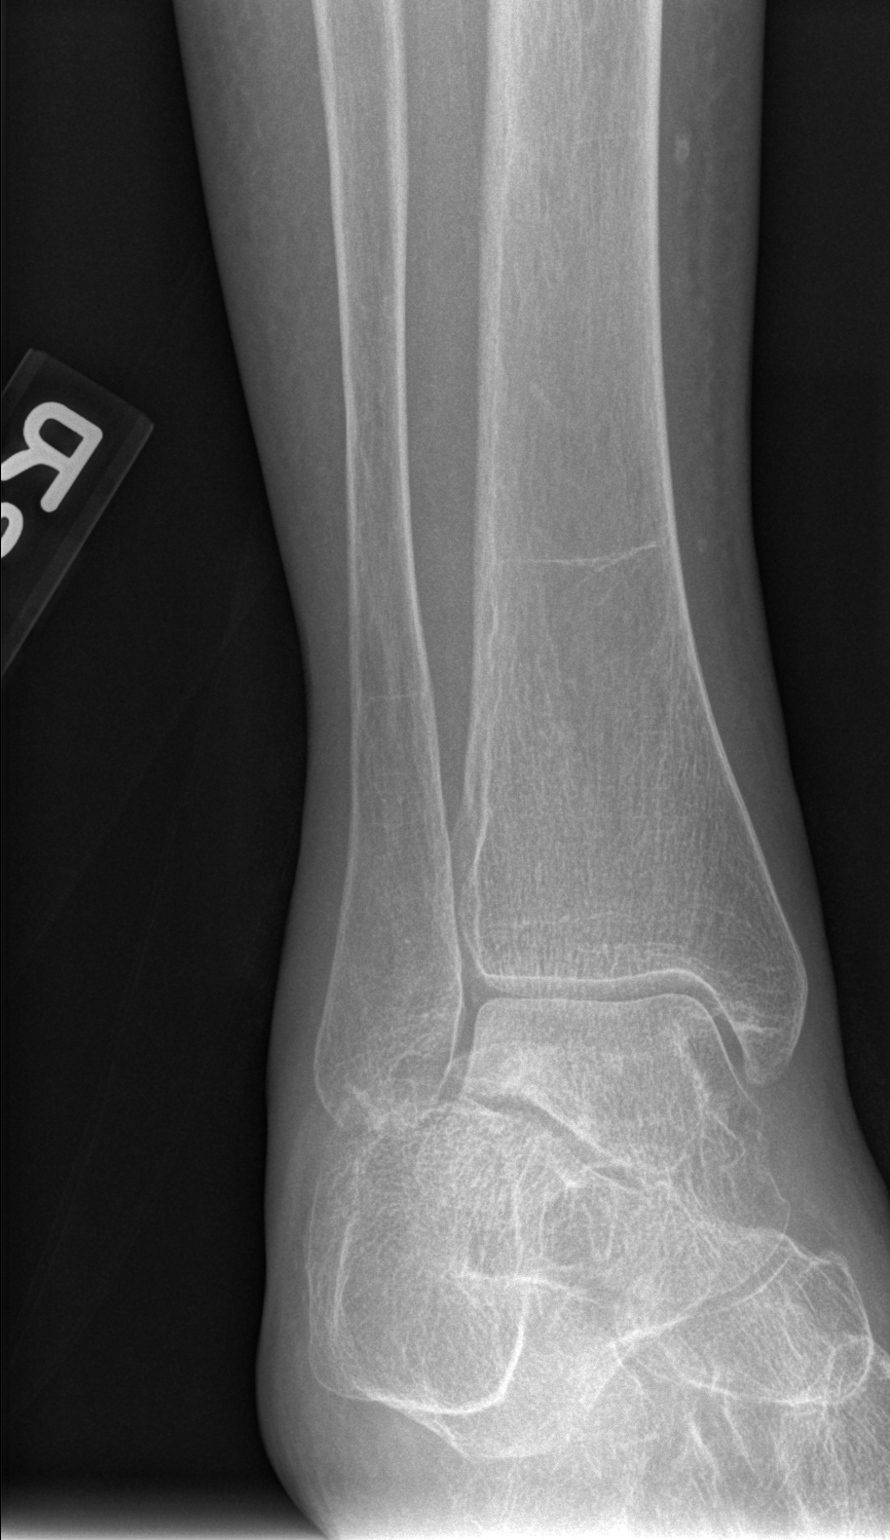

[t ankle joint lat right]
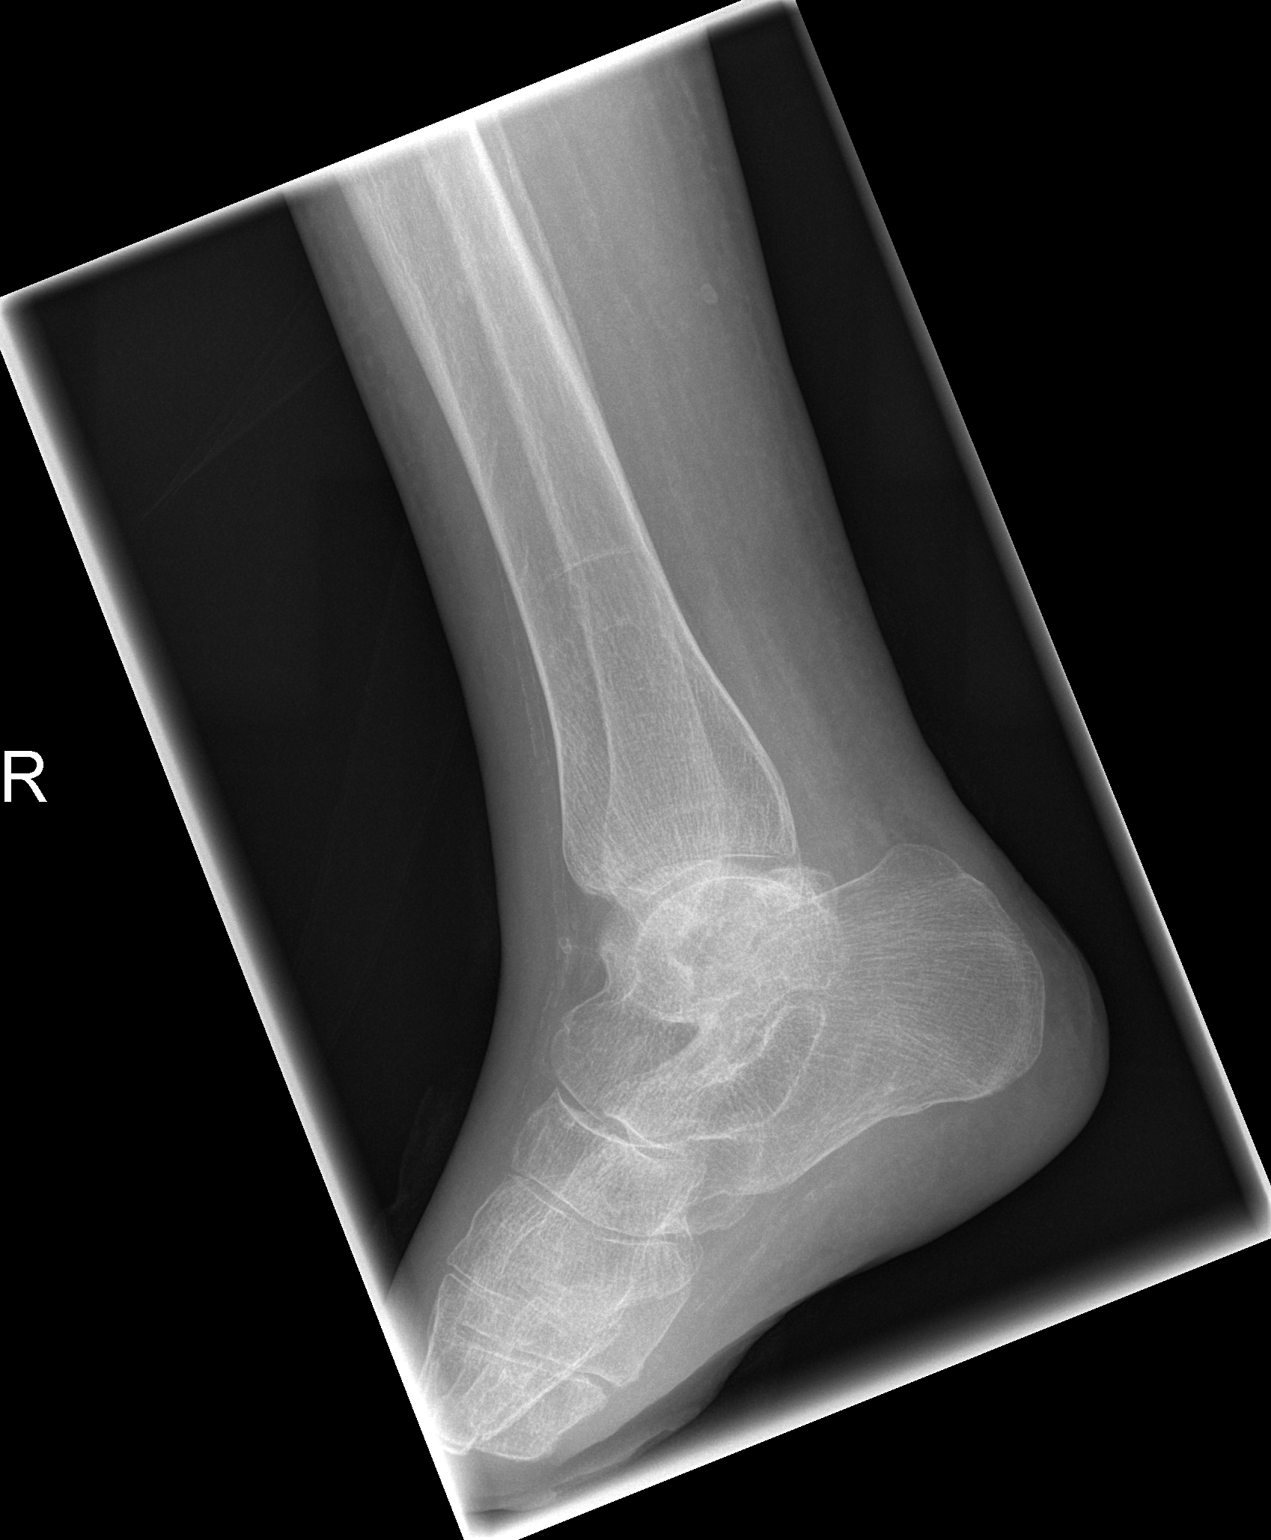

[3 of 3 positions shown; findings below may reference images not displayed]

FINDINGS: There is no evidence of fracture, dislocation, or joint effusion.
There is no evidence of arthropathy or other focal bone abnormality.
Soft tissues are unremarkable. Prior transmetatarsal amputation.
IMPRESSION: Negative.

## 2018-02-24 IMAGING — CR DG TIBIA/FIBULA 2V*R*
2 series · 2 of 2 positions shown · non-contrast
Comparison: None.

CLINICAL DATA: Fell in gym 5 days ago, with persistent lower
extremity pain.

EXAM:
RIGHT TIBIA AND FIBULA - 2 VIEW

[t tib/fib ap right]
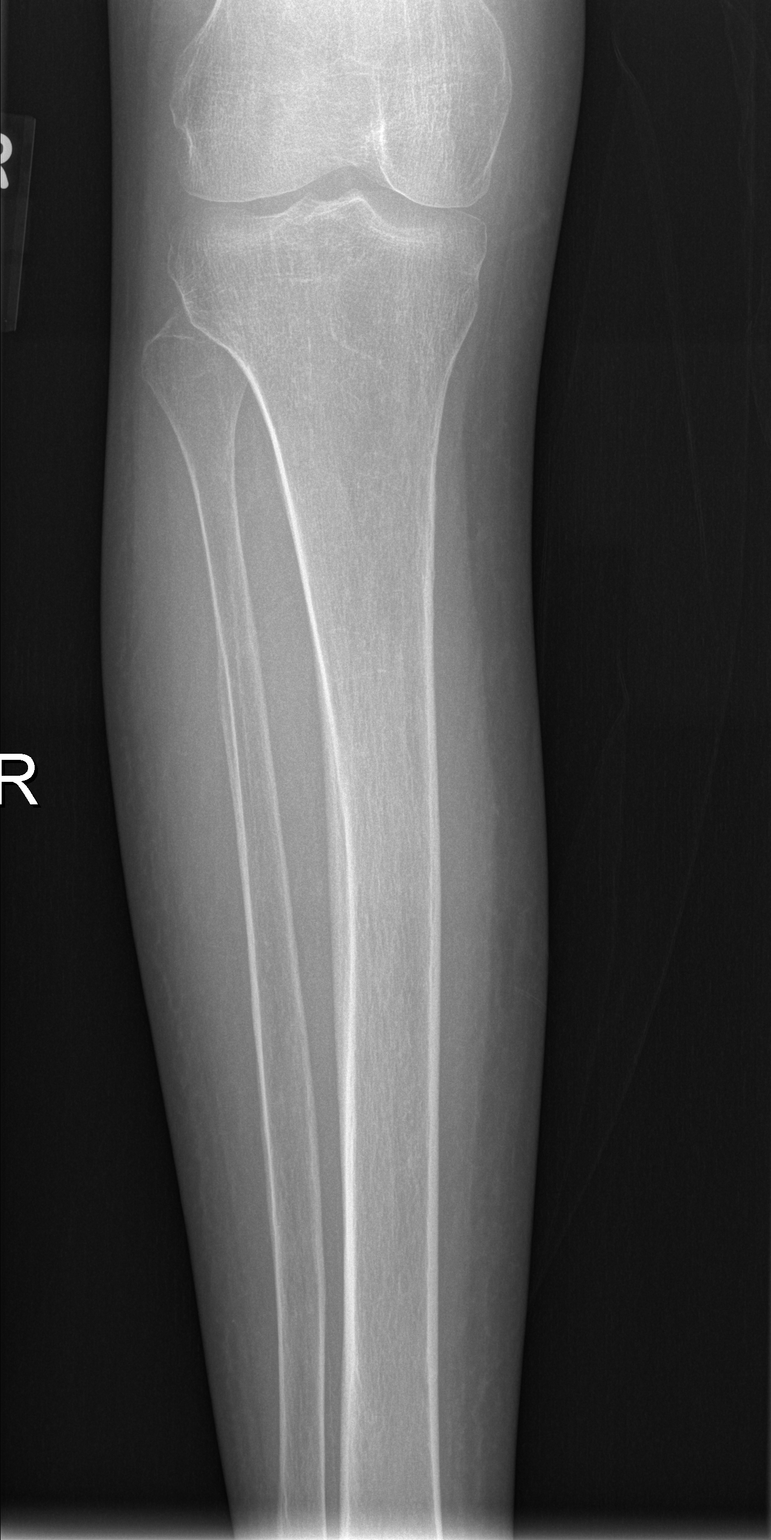

[t tib/fib lat right]
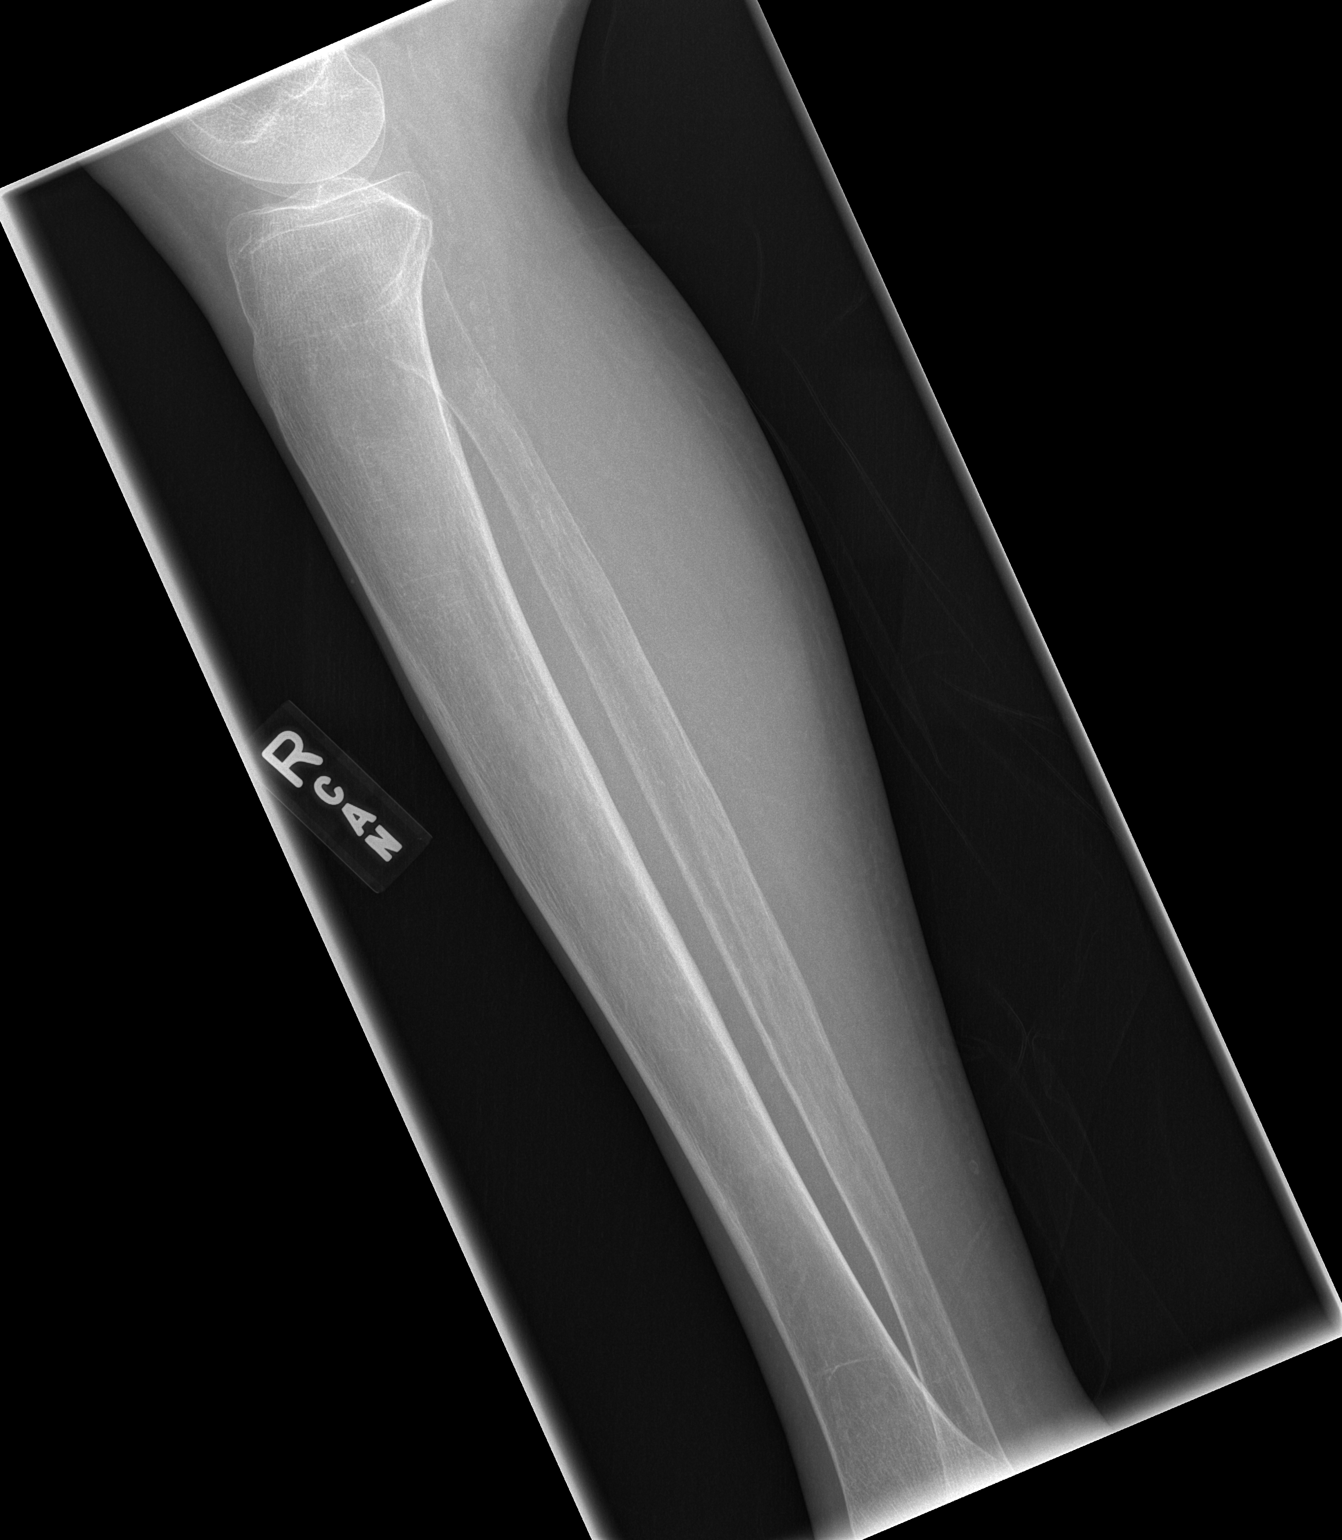

[2 of 2 positions shown; findings below may reference images not displayed]

FINDINGS: There is no evidence of fracture or other focal bone lesions. Soft
tissues are unremarkable.
IMPRESSION: Negative.

## 2018-02-24 IMAGING — CR DG RIBS W/ CHEST 3+V*L*
4 series · 4 of 4 positions shown · non-contrast
Comparison: 05/23/2016

CLINICAL DATA: Left rib pain. Felt something pop when leaning
against a [REDACTED].

EXAM:
LEFT RIBS AND CHEST - 3+ VIEW

[w chest pa]
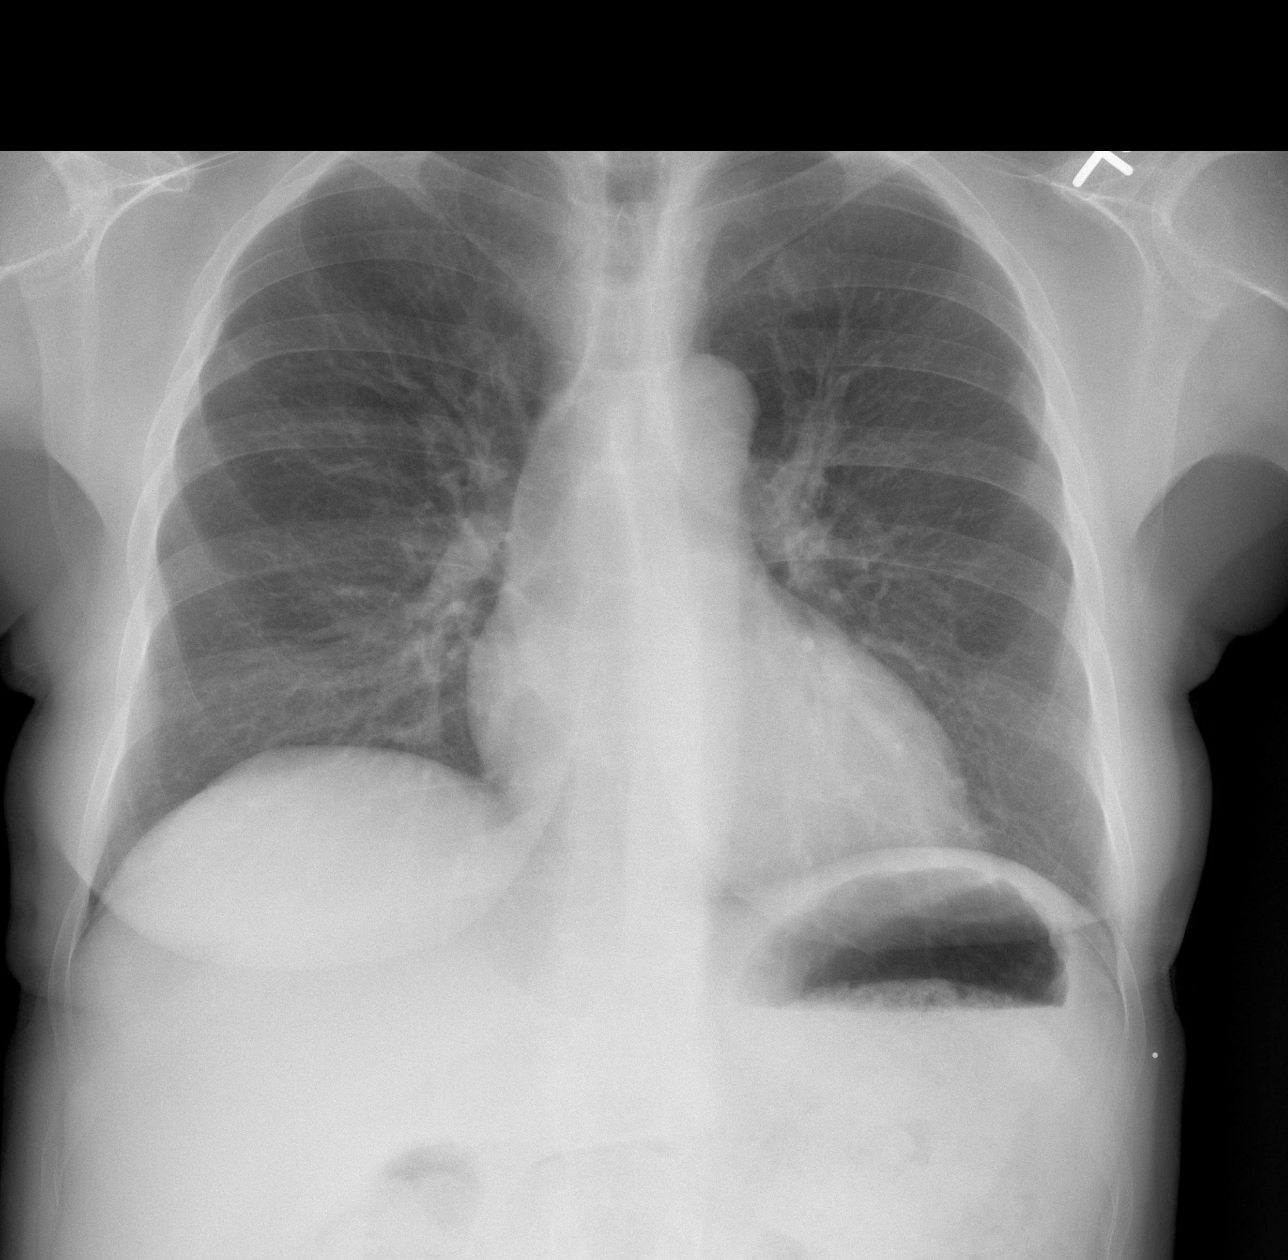

[w ribs ap/pa upper left]
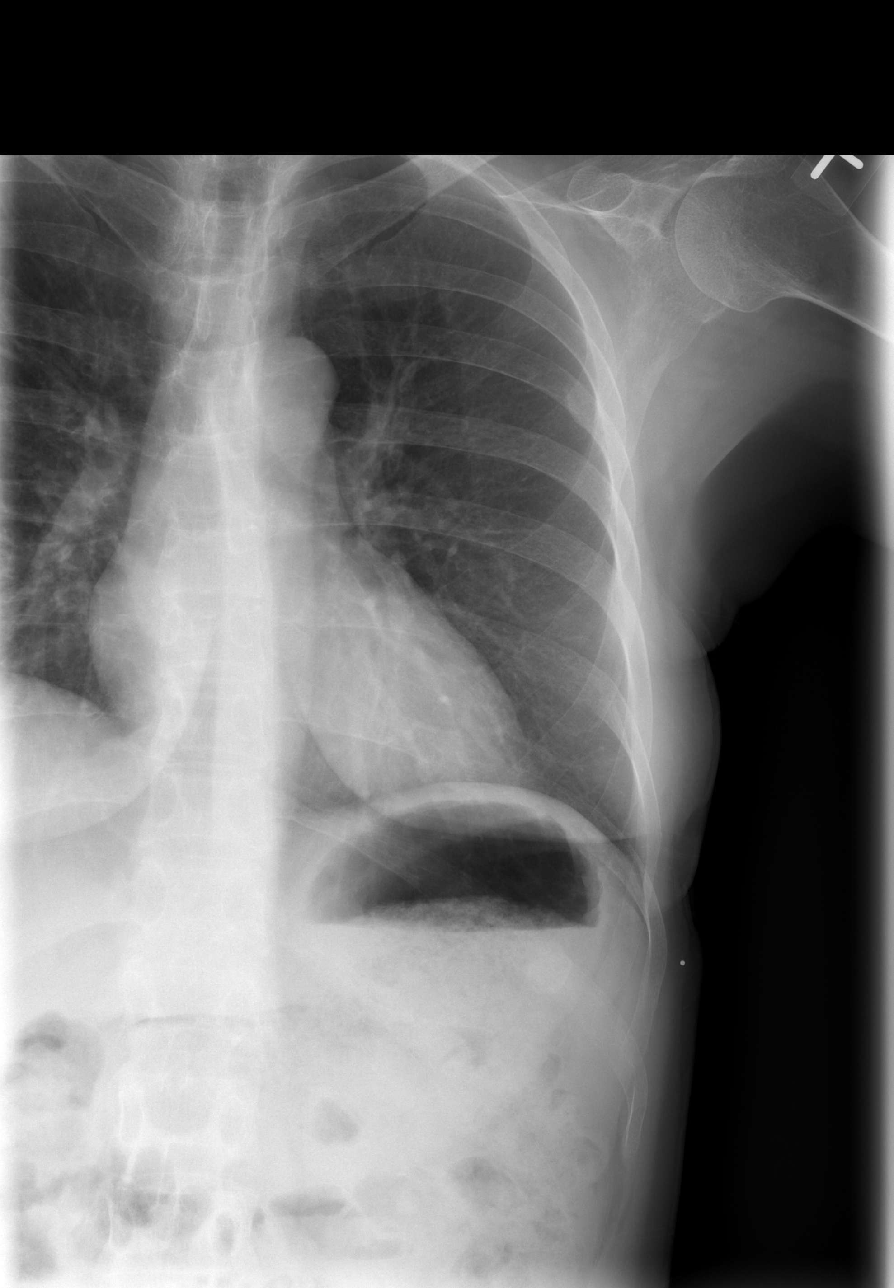

[w ribs ap/pa lower left]
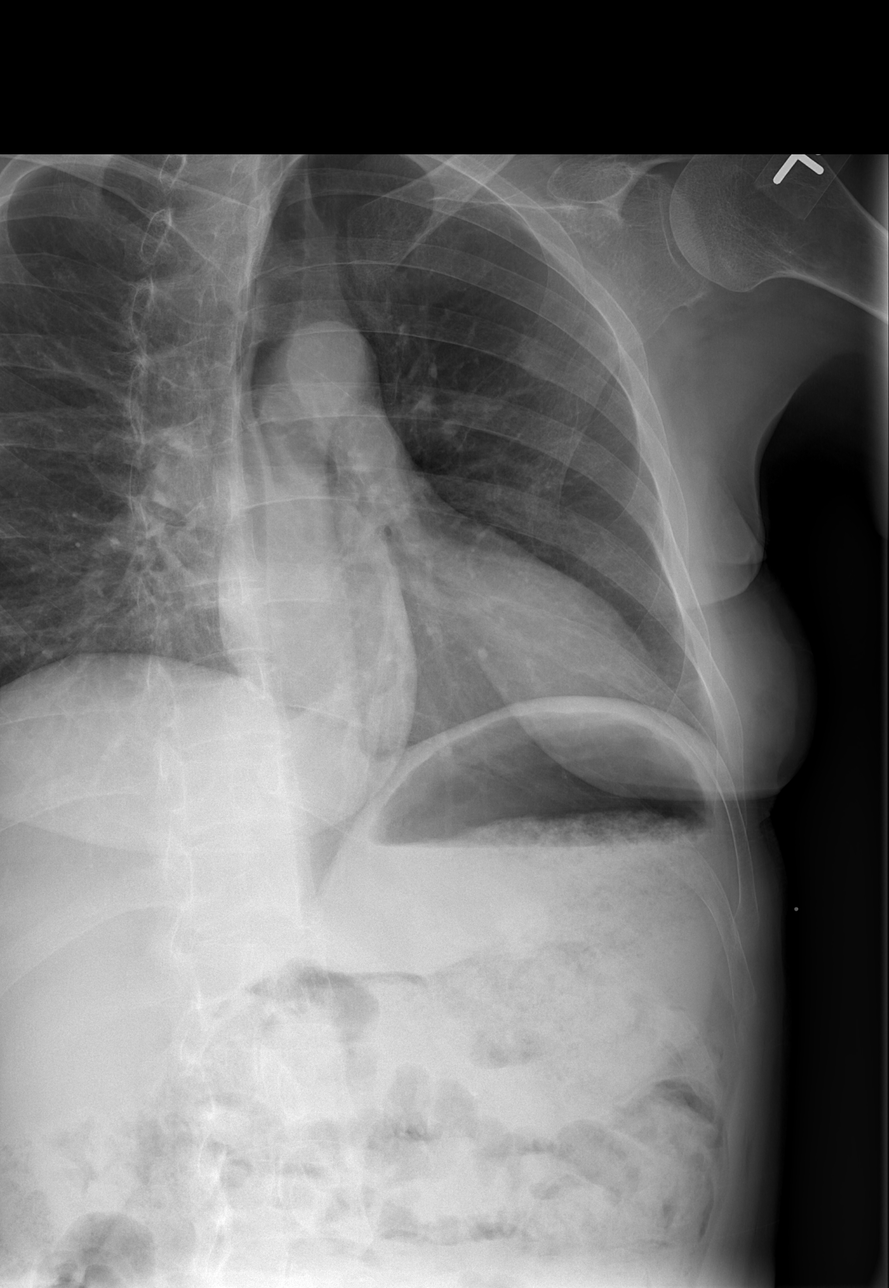

[w ribs oblique left]
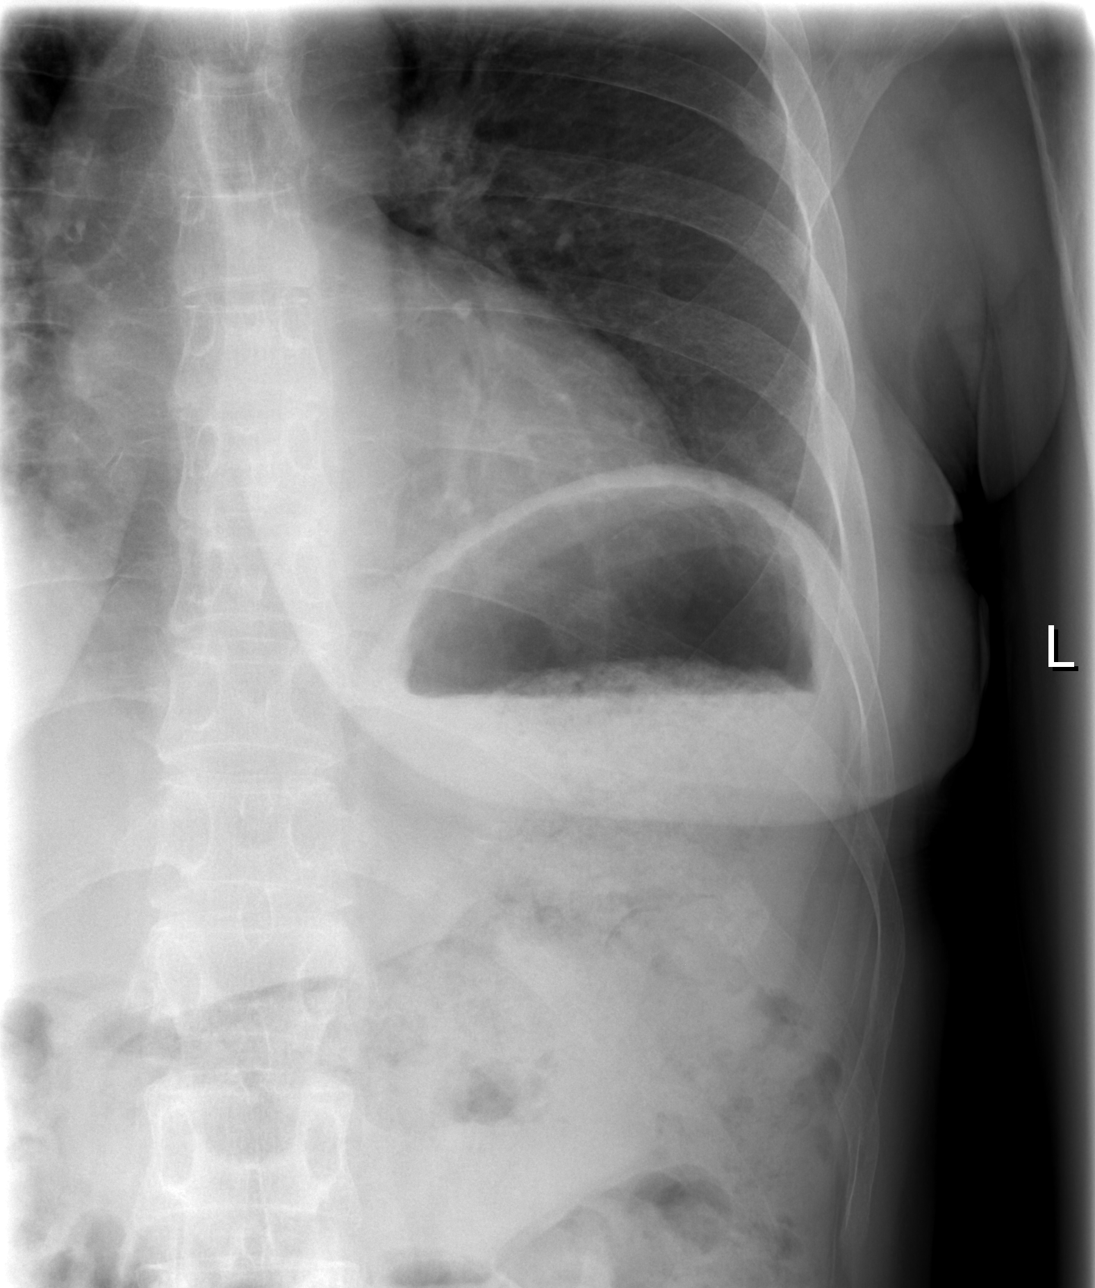

[4 of 4 positions shown; findings below may reference images not displayed]

FINDINGS: Remote healed rib fracture deformities. No evidence of acute
fracture. The lungs are clear. No pneumothorax. No effusion. Normal
pulmonary vasculature. Normal hilar and mediastinal contours. Normal
heart size.
IMPRESSION: Negative.

## 2018-02-24 IMAGING — CR DG KNEE COMPLETE 4+V*R*
4 series · 4 of 4 positions shown · non-contrast
Comparison: None.

CLINICAL DATA: Fell in gym 5 days ago, with persistent lower
extremity pain.

EXAM:
RIGHT KNEE - COMPLETE 4+ VIEW

[t knee ap right]
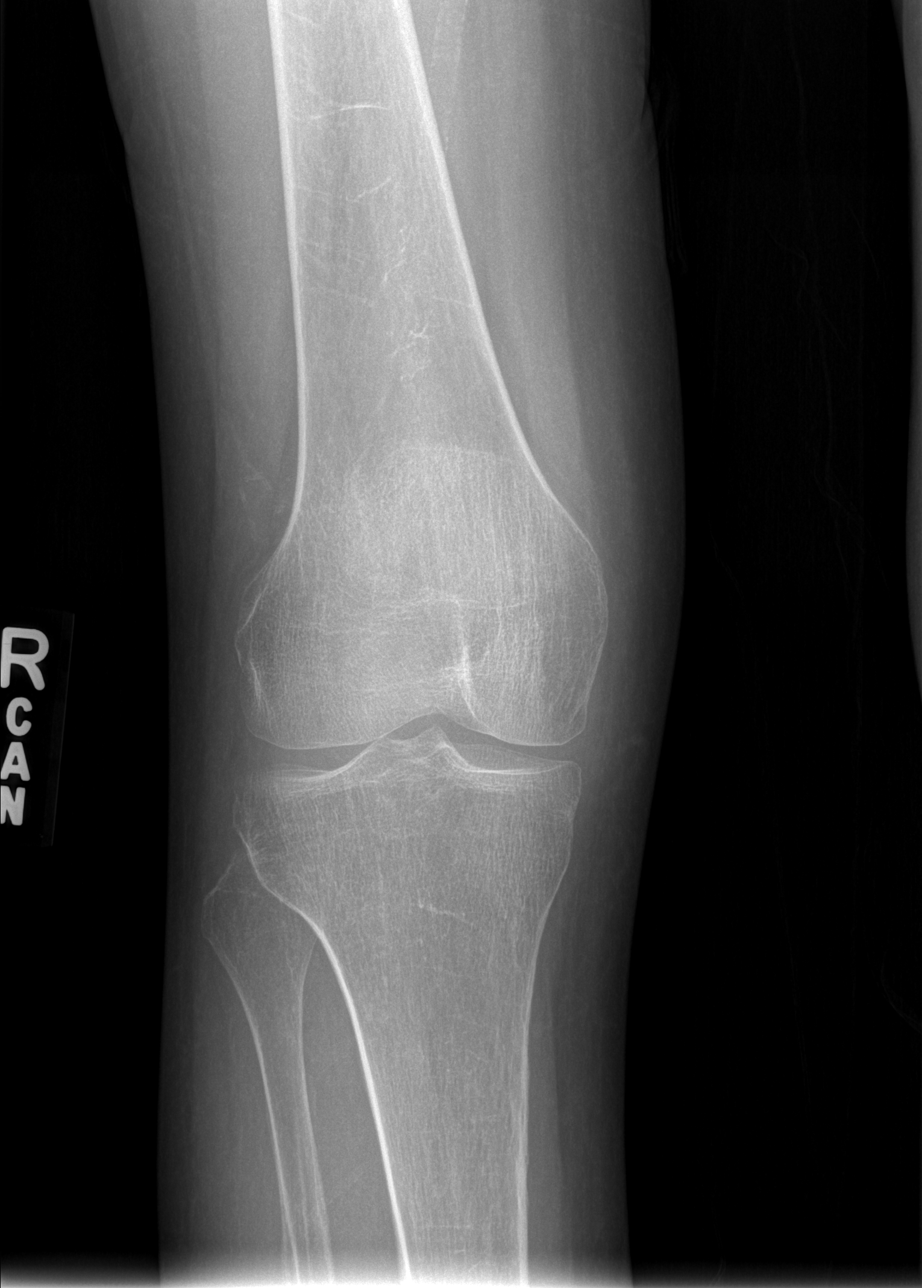

[t knee oblique right (1 of 2)]
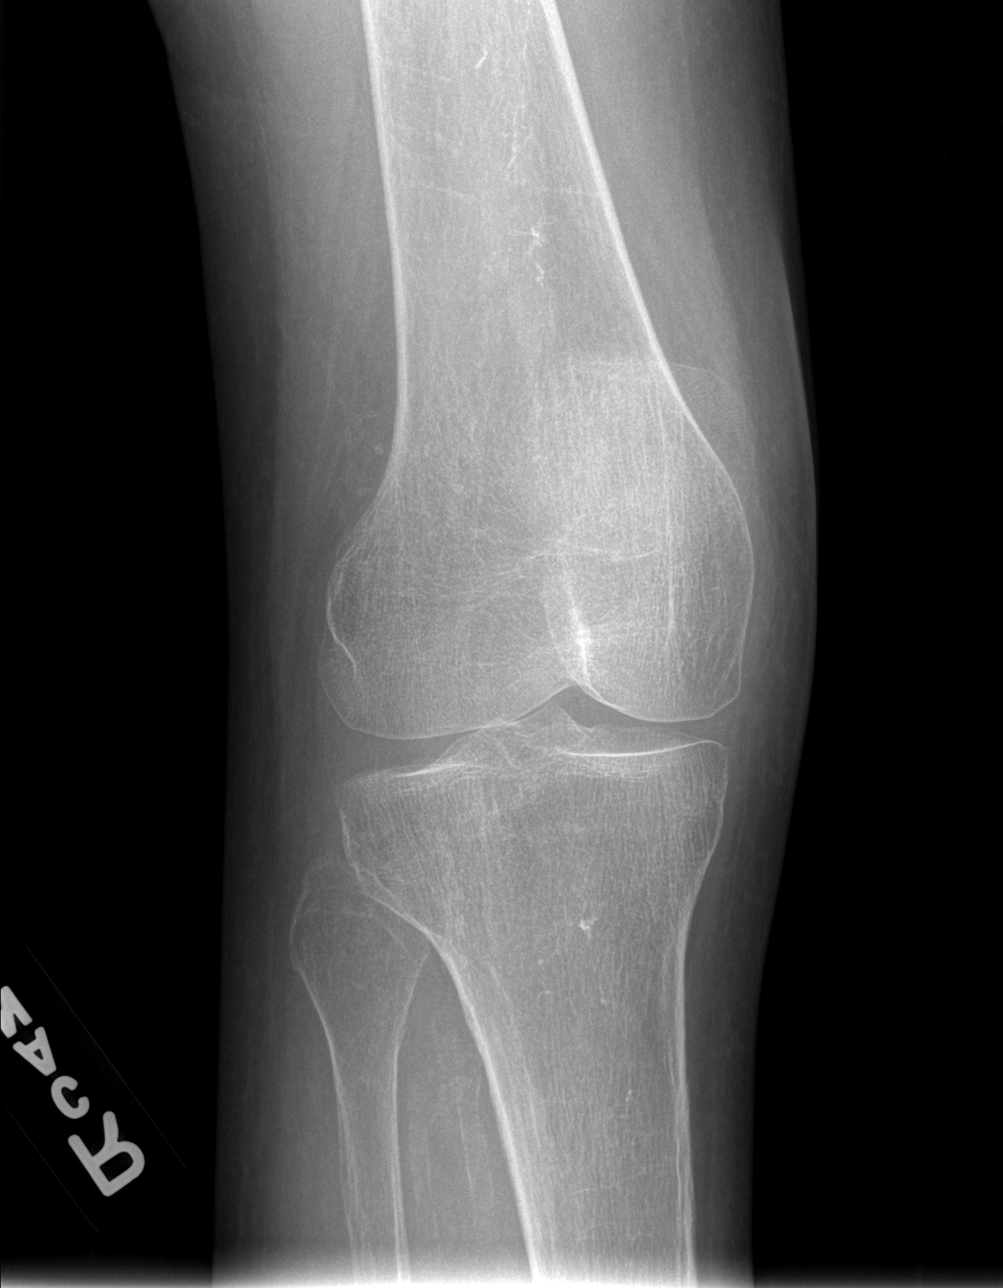

[t knee oblique right (2 of 2)]
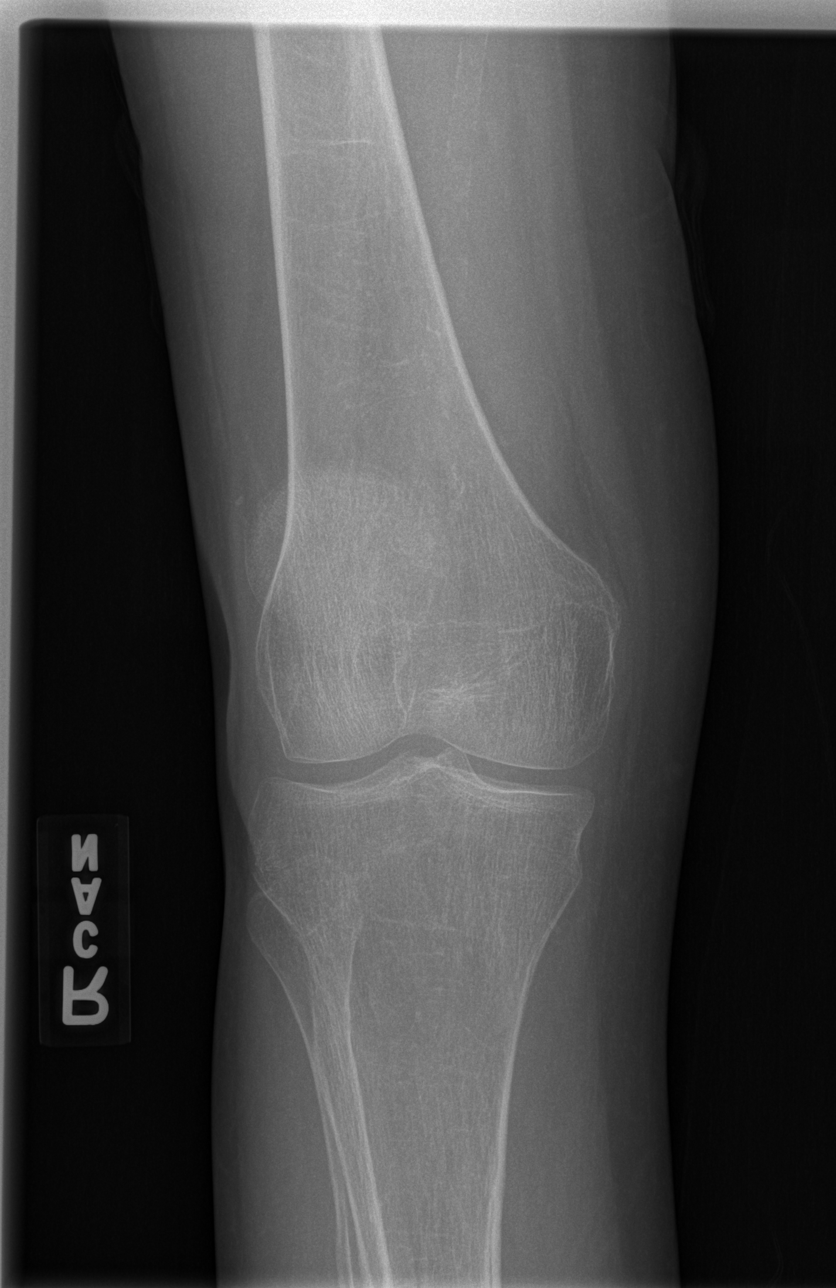

[t knee lat right]
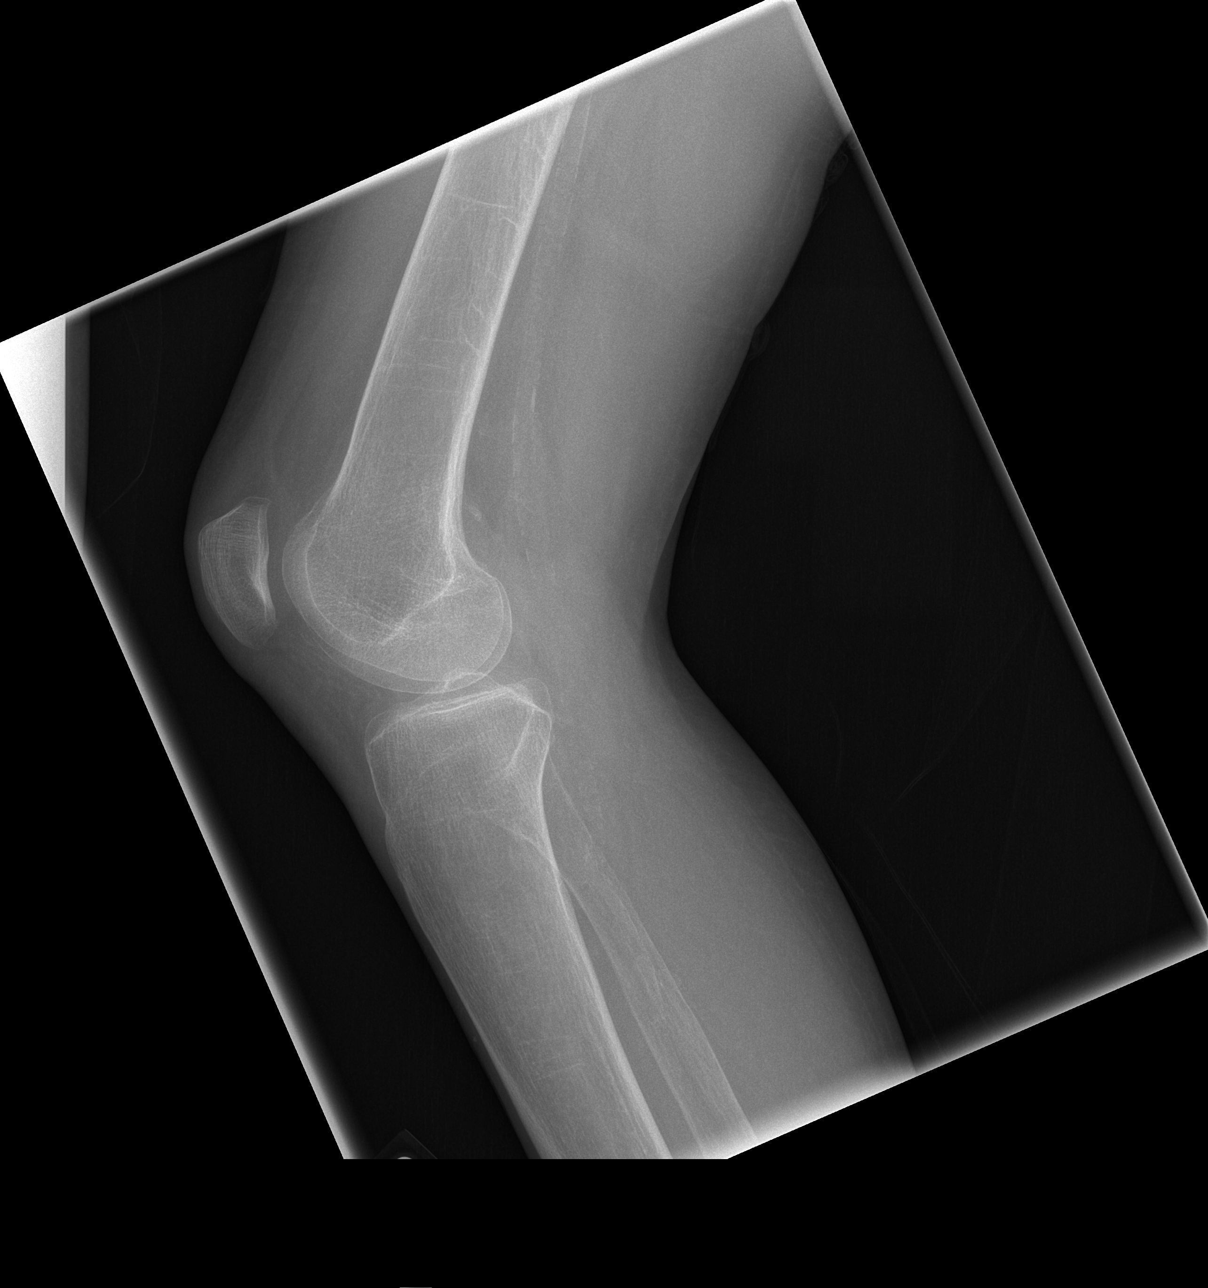

[4 of 4 positions shown; findings below may reference images not displayed]

FINDINGS: No evidence of fracture, dislocation, or joint effusion. No evidence
of arthropathy or other focal bone abnormality. Soft tissues are
unremarkable.
IMPRESSION: Negative.

## 2018-03-04 ENCOUNTER — Encounter (HOSPITAL_BASED_OUTPATIENT_CLINIC_OR_DEPARTMENT_OTHER): Payer: Medicare Other | Attending: Internal Medicine

## 2018-03-04 DIAGNOSIS — L97522 Non-pressure chronic ulcer of other part of left foot with fat layer exposed: Secondary | ICD-10-CM | POA: Insufficient documentation

## 2018-03-04 DIAGNOSIS — G4730 Sleep apnea, unspecified: Secondary | ICD-10-CM | POA: Diagnosis not present

## 2018-03-04 DIAGNOSIS — E1040 Type 1 diabetes mellitus with diabetic neuropathy, unspecified: Secondary | ICD-10-CM | POA: Diagnosis not present

## 2018-03-04 DIAGNOSIS — L97412 Non-pressure chronic ulcer of right heel and midfoot with fat layer exposed: Secondary | ICD-10-CM | POA: Diagnosis not present

## 2018-03-04 DIAGNOSIS — E10621 Type 1 diabetes mellitus with foot ulcer: Secondary | ICD-10-CM | POA: Insufficient documentation

## 2018-03-04 DIAGNOSIS — Z86718 Personal history of other venous thrombosis and embolism: Secondary | ICD-10-CM | POA: Insufficient documentation

## 2018-03-04 DIAGNOSIS — I10 Essential (primary) hypertension: Secondary | ICD-10-CM | POA: Insufficient documentation

## 2018-03-04 DIAGNOSIS — E1051 Type 1 diabetes mellitus with diabetic peripheral angiopathy without gangrene: Secondary | ICD-10-CM | POA: Diagnosis not present

## 2018-03-09 ENCOUNTER — Other Ambulatory Visit: Payer: Self-pay | Admitting: Endocrinology

## 2018-03-11 DIAGNOSIS — E10621 Type 1 diabetes mellitus with foot ulcer: Secondary | ICD-10-CM | POA: Diagnosis not present

## 2018-03-12 ENCOUNTER — Other Ambulatory Visit: Payer: Self-pay | Admitting: Endocrinology

## 2018-03-18 DIAGNOSIS — E10621 Type 1 diabetes mellitus with foot ulcer: Secondary | ICD-10-CM | POA: Diagnosis not present

## 2018-03-19 ENCOUNTER — Other Ambulatory Visit (HOSPITAL_COMMUNITY)
Admission: RE | Admit: 2018-03-19 | Discharge: 2018-03-19 | Disposition: A | Discharge status: Home / Self Care | Payer: Medicare Other | Source: Other Acute Inpatient Hospital | Attending: Internal Medicine | Admitting: Internal Medicine

## 2018-03-19 DIAGNOSIS — E10621 Type 1 diabetes mellitus with foot ulcer: Secondary | ICD-10-CM | POA: Diagnosis present

## 2018-03-21 ENCOUNTER — Ambulatory Visit (INDEPENDENT_AMBULATORY_CARE_PROVIDER_SITE_OTHER): Payer: Medicare Other | Admitting: Endocrinology

## 2018-03-21 VITALS — BP 134/78 | HR 77 | Wt 125.6 lb

## 2018-03-21 DIAGNOSIS — E1065 Type 1 diabetes mellitus with hyperglycemia: Secondary | ICD-10-CM

## 2018-03-21 DIAGNOSIS — E1022 Type 1 diabetes mellitus with diabetic chronic kidney disease: Secondary | ICD-10-CM | POA: Diagnosis not present

## 2018-03-21 DIAGNOSIS — N184 Chronic kidney disease, stage 4 (severe): Secondary | ICD-10-CM

## 2018-03-21 DIAGNOSIS — I70235 Atherosclerosis of native arteries of right leg with ulceration of other part of foot: Secondary | ICD-10-CM

## 2018-03-21 DIAGNOSIS — E7800 Pure hypercholesterolemia, unspecified: Secondary | ICD-10-CM

## 2018-03-21 NOTE — Progress Notes (Signed)
Patient ID: [name], female   DOB: 1970/06/08, 47 y.o.   MRN: [id number]   Reason for Appointment: follow-up of diabetes  History of Present Illness   Diagnosis: Type 1 DIABETES MELITUS  Previous history: She has had long-standing poorly controlled diabetes and typically has poor compliance with self care measures despite periodic diabetes education and periodic followup  INSULIN PUMP regimen:   Currently using MEDTRONIC 723 model   Basal rate midnight = 0.6, 3 AM = 0.5, 6 AM = 0.4, 11 AM = 0.3, 2 PM = 0.9 and 8 PM = 0.75   Carb coverage 1: 20 g and correction 1: 70 during the day and 80 from 10 PM-8 AM Glucose target 150, active insulin 5 hours         Recent history:   Her last A1c was  10.1 and about the same as before   Current blood sugar patterns, difficulties with management and problems identified:   Her blood sugars are again very poorly controlled  Because of her hyperglycemia her basal rates were increased between 11 AM and 8 PM on the last visit  However she thinks that she has a tendency to low sugars overnight which is mostly after midnight over the last couple of months but did not report this  She is frequently suspending her pump because of fear of hypoglycemia overnight and this will then cause her blood sugars to rise over 400  Also because of fear of hypoglycemia she is only rarely bolusing for her meals or high sugars  As discussed in the description of her CGM patterns below her blood sugars are the highest still between noon-midnight  She has some tendency to blood sugars decreasing significantly before 6 AM and at times before about 12 noon without without correction boluses  On an average her bolus wizard events are less than 1 time per day and at least on 4 of her recent days has no boluses at all  She has been managed suspending her pump for as much as 14 hours/day  Some of her variability in blood sugars may be partly  related to renal dysfunction and altered kinetics of insulin    CONTINUOUS GLUCOSE MONITORING RECORD INTERPRETATION    Dates of Recording: 12/7 through 12/20  Sensor description: Freestyle libre  Results statistics:   CGM use % of time  78  Average and SD  224+/-103  Time in range    33    %  % Time Above 180  62  % Time above 250  34  % Time Below target  5    Glycemic patterns summary: By comparison her hyperglycemia is less in the last 2 weeks compared to her previous CGM recordings with the most significant hyperglycemia between noon and midnight along with significant variability transient low sugars are occasionally around noon or 6 PM  Hyperglycemic episodes are occurring on a daily basis with most prominent increase in blood sugar between noon and 6 PM with considerable variability  Hypoglycemic episodes occurred on 4 of her last 2 weeks recording at about noon-2 PM, around 6 PM and once transiently around 3 AM  Overnight periods: Blood sugars are quite variable but average blood sugar at least over 200 throughout the night depending on her glucose at bedtime and whether she is suspending her pump or not  Preprandial periods: Difficult to assess since she is not eating any consistent meals at any consistent times blood sugar at her  first meal is usually averaging about 191  Postprandial periods: Blood sugars are significantly higher in the afternoon after eating because of lack of boluses usually  AVERAGE blood sugar at any given time of the day by CGM ranges between the lowest level of 185 from 6-8 AM and the highest 276 between 4-6 PM 239 after 6 a.m. and 307 after 4 p.m.Marland Kitchen Blood sugar is below 200 only between 6 AM-12 noon overall   Meals: eating at variable times and also having snacks periodically.  Her daily carbohydrate intake is quite variable Physical activity: exercise: none         Dietician visit: Most recent:?          Complications: are: Neuropathy,  nephropathy, diabetic foot ulcer      Wt Readings from Last 3 Encounters:  03/21/18 125 lb 9.6 oz (57 kg)  01/15/18 123 lb (55.8 kg)  12/31/17 115 lb (52.2 kg)   Lab Results  Component Value Date   HGBA1C 10.1 (H) 01/13/2018   HGBA1C 10.8 (H) 09/26/2017   HGBA1C 10.6 04/08/2017   Lab Results  Component Value Date   MICROALBUR 304.2 (H) 11/11/2017   LDLCALC 78 05/02/2016   CREATININE 3.47 (H) 01/13/2018   Hospital Outpatient Visit on 03/19/2018  Component Date Value Ref Range Status  . Specimen Description 03/19/2018    Final                   Value:WOUND LEFT FOOT Performed at [facility], [place] [address], [place], [place] [address]   . Special Requests 03/19/2018    Final                   Value:NONE Performed at [facility], [place] [address], [place], [place] [address]   . Gram Stain 03/19/2018    Final                   Value:RARE WBC PRESENT, PREDOMINANTLY MONONUCLEAR ABUNDANT GRAM POSITIVE COCCI   . Culture 03/19/2018    Final                   Value:CULTURE REINCUBATED FOR BETTER GROWTH Performed at [facility], [place] [address], [place], [place] [address]   . Report Status 03/19/2018 PENDING   Incomplete    Other problems addressed:: See review of systems   Allergies as of 03/21/2018      Reactions   Cleocin [clindamycin Hcl] Diarrhea   Lisinopril Other (See Comments)   Elevated potassium per pt report   Amoxicillin Diarrhea, Nausea Only, Other (See Comments)   Has patient had a PCN reaction causing immediate rash, facial/tongue/throat swelling, SOB or lightheadedness with hypotension: No Has patient had a PCN reaction causing severe rash involving mucus membranes or skin necrosis: No Has patient had a PCN reaction that required hospitalization: No Has patient had a PCN reaction occurring within the last 10 years: No If all of the above answers are "NO", then may proceed with Cephalosporin use.   Bactrim  [sulfamethoxazole-trimethoprim] Diarrhea, Nausea Only      Medication List       Accurate as of March 21, 2018  1:49 PM. Always use your most recent med list.        albuterol 108 (90 Base) MCG/ACT inhaler Commonly known as:  PROVENTIL HFA;VENTOLIN HFA Inhale 2 puffs into the lungs every 6 (six) hours as needed for wheezing or shortness of breath. Reported on  04/15/2015   amLODipine 5 MG tablet Commonly known as:  NORVASC Take 1 tablet (5 mg total) by mouth daily.   aspirin 81 MG tablet Take 81 mg by mouth every evening.   BD PEN NEEDLE NANO U/F 32G X 4 MM Misc Generic drug:  Insulin Pen Needle USE 1 DEVICE BY DOES NOT APPLY ROUTE 2 (TWO) TIMES DAILY.   calcitRIOL 0.5 MCG capsule Commonly known as:  ROCALTROL Take 1 capsule daily   CALCIUM GUMMIES 250-100-500 MG-MG-UNIT Chew Generic drug:  Calcium-Phosphorus-Vitamin D Chew 2 each by mouth every evening.   CINNAMON PO Take 1 tablet by mouth every evening.   folic acid 1 MG tablet Commonly known as:  FOLVITE Take 1 mg by mouth every evening.   FREESTYLE LIBRE READER Devi 1 Device by Does not apply route as directed.   FREESTYLE LIBRE SENSOR SYSTEM Misc Use 1 sensor every 10 days   furosemide 40 MG tablet Commonly known as:  LASIX Take 40 mg by mouth daily as needed for fluid.   HUMALOG 100 UNIT/ML injection Generic drug:  insulin lispro CALL MD IF <70, IF 151-200 =2UNITS,201-250 =4 UNITS,251-300 =6 UNITS, 301-350 =8 UNITS,351-400 =10 U   ketorolac 0.5 % ophthalmic solution Commonly known as:  ACULAR PLACE 1 DROP IN RIGHT EYE FOUR TIMES A DAY. START 1 WEEK PRIOR TO SURGERY   loratadine 10 MG tablet Commonly known as:  CLARITIN Take 1 tablet (10 mg total) by mouth daily.   norethindrone 5 MG tablet Commonly known as:  AYGESTIN Take by mouth.   ondansetron 4 MG disintegrating tablet Commonly known as:  ZOFRAN-ODT DISSOLVE 1 TABLET ON TONGUE EVERY 8 HOURS AS NEEDED FOR NAUSEA   pravastatin 40 MG  tablet Commonly known as:  PRAVACHOL TAKE 1 TABLET BY MOUTH EVERY DAY   pyridoxine 100 MG tablet Commonly known as:  B-6 Take 100 mg by mouth daily. Reported on 04/15/2015   V-R VITAMIN B-12 500 MCG tablet Generic drug:  vitamin B-12 Take by mouth.   vitamin C 500 MG tablet Commonly known as:  ASCORBIC ACID Take 500 mg by mouth daily.   Vitamin D (Ergocalciferol) 1.25 MG (50000 UT) Caps capsule Commonly known as:  DRISDOL TAKE ONE CAPSULE BY MOUTH ONE TIME PER WEEK       Allergies:  Allergies  Allergen Reactions  . Cleocin [Clindamycin Hcl] Diarrhea  . Lisinopril Other (See Comments)    Elevated potassium per pt report  . Amoxicillin Diarrhea, Nausea Only and Other (See Comments)    Has patient had a PCN reaction causing immediate rash, facial/tongue/throat swelling, SOB or lightheadedness with hypotension: No Has patient had a PCN reaction causing severe rash involving mucus membranes or skin necrosis: No Has patient had a PCN reaction that required hospitalization: No Has patient had a PCN reaction occurring within the last 10 years: No If all of the above answers are "NO", then may proceed with Cephalosporin use.   . Bactrim [Sulfamethoxazole-Trimethoprim] Diarrhea and Nausea Only    Past Medical History:  Diagnosis Date  . Anemia   . Arthritis   . Bronchitis   . C. difficile diarrhea 09/26/2014  . Cataracts, both eyes   . Cellulitis of right foot 06/02/2014  . CKD (chronic kidney disease) stage 3, GFR 30-59 ml/min (HCC)    sees Dr Justin Mend- Stage IV  . Diabetic ulcer of right foot ([place]) 06/02/2014  . DVT (deep venous thrombosis) ([place]) 10/2014   one in each one in leg- PICC line ,  one in right and left arm  . H/O seasonal allergies   . Heart murmur    told once when she was pregnant- no furter mention- was in notes 11/2015- had Echo 8 /4/17  . History of blood transfusion   . Hypercholesteremia   . Hypertension   . Left foot infection   . Neuropathy    feet  .  Neuropathy in diabetes ([place])   . Peripheral vascular disease ([place])   . Sleep apnea    not able to use CPAP  . Staphylococcus aureus bacteremia 10/2014  . Type 1 diabetes ([place])    onset age 14    Past Surgical History:  Procedure Laterality Date  . AMPUTATION TOE Left 07/24/2015   5th toe and Hallux amputation  . CESAREAN SECTION    . EXOSTECTECTOMY TOE Left 04/11/2016   Procedure: EXOSTECTECTOMY CUBOID AND 5TH METATARSAL AND PLACEMENT OF ANTIBIOTIC BEADS;  Surgeon: Edrick Kins, DPM;  Location: [facility];  Service: Podiatry;  Laterality: Left;  . EYE SURGERY Bilateral    [place]  . FEMORAL-POPLITEAL BYPASS GRAFT Left 05/03/2015  . FOOT AMPUTATION THROUGH METATARSAL Right 2016  . hemrrhoidectomy    . I&D EXTREMITY Right 06/10/2014   Procedure: IRRIGATION AND DEBRIDEMENT Right Foot;  Surgeon: Newt Minion, MD;  Location: [facility];  Service: Orthopedics;  Laterality: Right;  . INCISION AND DRAINAGE Left 04/11/2016   Procedure: INCISION AND DRAINAGE A DEEP COMPLICATED WOUND OF LEFT FOOT;  Surgeon: Edrick Kins, DPM;  Location: [facility];  Service: Podiatry;  Laterality: Left;  . IR FLUORO GUIDE CV LINE RIGHT  09/24/2017  . IR REMOVAL TUN CV CATH W/O FL  11/22/2017  . IR US GUIDE VASC ACCESS RIGHT  09/24/2017  . IRRIGATION AND DEBRIDEMENT ABSCESS Left 05/06/2017   Procedure: IRRIGATION AND DEBRIDEMENT ABSCESS LEFT FOOT;  Surgeon: Edrick Kins, DPM;  Location: [facility];  Service: Podiatry;  Laterality: Left;  . PERIPHERALLY INSERTED CENTRAL CATHETER INSERTION    . SKIN GRAFT Right 06/15/2014  . SKIN GRAFT Left 06/2015   foot   . SKIN SPLIT GRAFT Right 06/15/2014   Procedure: SPLIT THICKNESS SKIN GRAFT RIGHT FOOT;  Surgeon: Newt Minion, MD;  Location: [facility];  Service: Orthopedics;  Laterality: Right;  . TRANSMETATARSAL AMPUTATION Right   . TRANSMETATARSAL AMPUTATION Right 09/11/2014    Family History  Problem Relation Age of Onset  . Hyperlipidemia Mother   . Dementia Mother     Social History:   reports that she quit smoking about 9 years ago. She quit after 15.00 years of use. She has never used smokeless tobacco. She reports current alcohol use. She reports that she does not use drugs.  Review of Systems:   Hypertension/CKD:     Hypertension  Treated with amlodipine 2.5 mg, followed by nephrologist  BP Readings from Last 3 Encounters:  03/21/18 134/78  01/15/18 124/82  12/31/17 126/75   She tends to have high potassium at times, recent labs were done with a very high blood sugar  Lab Results  Component Value Date   K 5.3 (H) 01/13/2018   Renal function is consistently significantly high and she has not gone to have her fistula inserted yet Also not regular with her follow-up with nephrologist   Lab Results  Component Value Date   CREATININE 3.47 (H) 01/13/2018   CREATININE 3.87 (H) 11/11/2017   CREATININE 3.92 (H) 11/11/2017    She is taking calcitriol from nephrologist  Also has vitamin  D deficiency   Lab Results  Component Value Date   CALCIUM 7.0 (L) 01/13/2018   PHOS 3.5 08/18/2014     Lipids: Has been  treated with pravastatin Triglycerides are high on the last measurement LDL as follows   Lab Results  Component Value Date   CHOL 176 09/26/2017   HDL 22.60 (L) 09/26/2017   LDLCALC 78 05/02/2016   LDLDIRECT 78.0 09/26/2017   TRIG (H) 09/26/2017    465.0 Triglyceride is over 400; calculations on Lipids are invalid.   CHOLHDL 8 09/26/2017      Chronic anemia, followed by nephrologist and PCP:  Lab Results  Component Value Date   WBC 4.6 11/11/2017   HGB 10.8 (L) 11/26/2017   HCT 29.0 (L) 11/11/2017   MCV 86.6 11/11/2017   PLT 360 11/11/2017    She has a Charcot foot, has had amputation of Left fifth toe       Examination:   BP 134/78 (BP Location: Left Arm, Patient Position: Sitting, Cuff Size: Normal)   Pulse 77   Wt 125 lb 9.6 oz (57 kg)   SpO2 97%   BMI 22.97 kg/m   Body mass index is 22.97 kg/m.      ASSESSMENT/  PLAN:   Diabetes type 1 with poor control and multiple complications    Her diabetes is persistently poorly controlled related to inadequate insulin most of the time  See history of present illness for detailed discussion of current diabetes management, blood sugar patterns and problems identified  A1c on the last measurement was still over 10%  She is still getting inadequate basal insulin especially in the afternoons and early evening However difficult to judge her insulin requirements because of her frequently suspending the pump for several hours at a time This also causes marked variability in her blood sugars as outlined above She may have randomly decreasing blood sugars towards normal or slightly low readings either around noon or about 6 PM For this reason she will not take a bolus for her meals for fear of hypoglycemia and also not taking any consistent boluses even during the day when she is awake She wants to have her basal rates lowered to her previous settings Still not able to program her pump herself  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  She is to suspend only for a couple of hours if blood sugars are below 100  Basal rate will be reduced at midnight down to 0.5, at 3 AM down to 0.4 and at 11:30 AM down to 0.8 until 10 PM.  Changes were made in the office today  Again reminded to have balanced meals and not high carbohydrate intake  To take consistent correction boluses as programmed on the pump since her correction factor is only 1: 80 and target 150  More consistent follow-up  Need to follow-up with her nephrologist regularly also  There are no Patient Instructions on file for this visit.   Counseling time on subjects discussed  in assessment and plan sections is over 50% of today's 25 minute visit   Elayne Snare 03/21/2018, 1:49 PM

## 2018-03-23 ENCOUNTER — Encounter: Payer: Self-pay | Admitting: Endocrinology

## 2018-03-23 LAB — AEROBIC CULTURE W GRAM STAIN (SUPERFICIAL SPECIMEN)

## 2018-03-23 LAB — AEROBIC CULTURE  (SUPERFICIAL SPECIMEN)

## 2018-03-24 DIAGNOSIS — E10621 Type 1 diabetes mellitus with foot ulcer: Secondary | ICD-10-CM | POA: Diagnosis not present

## 2018-04-01 DIAGNOSIS — E10621 Type 1 diabetes mellitus with foot ulcer: Secondary | ICD-10-CM | POA: Diagnosis not present

## 2018-04-08 ENCOUNTER — Other Ambulatory Visit: Payer: Self-pay | Admitting: Endocrinology

## 2018-04-08 ENCOUNTER — Encounter (HOSPITAL_BASED_OUTPATIENT_CLINIC_OR_DEPARTMENT_OTHER): Payer: Medicare Other | Attending: Internal Medicine

## 2018-04-08 DIAGNOSIS — G4730 Sleep apnea, unspecified: Secondary | ICD-10-CM | POA: Diagnosis not present

## 2018-04-08 DIAGNOSIS — Z86718 Personal history of other venous thrombosis and embolism: Secondary | ICD-10-CM | POA: Diagnosis not present

## 2018-04-08 DIAGNOSIS — I10 Essential (primary) hypertension: Secondary | ICD-10-CM | POA: Insufficient documentation

## 2018-04-08 DIAGNOSIS — E1040 Type 1 diabetes mellitus with diabetic neuropathy, unspecified: Secondary | ICD-10-CM | POA: Insufficient documentation

## 2018-04-08 DIAGNOSIS — N6320 Unspecified lump in the left breast, unspecified quadrant: Secondary | ICD-10-CM

## 2018-04-08 DIAGNOSIS — L97412 Non-pressure chronic ulcer of right heel and midfoot with fat layer exposed: Secondary | ICD-10-CM | POA: Diagnosis not present

## 2018-04-08 DIAGNOSIS — E10621 Type 1 diabetes mellitus with foot ulcer: Secondary | ICD-10-CM | POA: Insufficient documentation

## 2018-04-08 DIAGNOSIS — E1051 Type 1 diabetes mellitus with diabetic peripheral angiopathy without gangrene: Secondary | ICD-10-CM | POA: Insufficient documentation

## 2018-04-08 DIAGNOSIS — N6310 Unspecified lump in the right breast, unspecified quadrant: Secondary | ICD-10-CM

## 2018-04-08 DIAGNOSIS — L97422 Non-pressure chronic ulcer of left heel and midfoot with fat layer exposed: Secondary | ICD-10-CM | POA: Insufficient documentation

## 2018-04-09 ENCOUNTER — Other Ambulatory Visit: Payer: Self-pay | Admitting: Endocrinology

## 2018-04-09 NOTE — Telephone Encounter (Signed)
Pt called and detailed voicemail left with her explaining to call ordering provider (dr. Cardell Peach) and have him refill it.

## 2018-04-09 NOTE — Telephone Encounter (Signed)
You did not prescribe this medication. Do you want to refill this?

## 2018-04-09 NOTE — Telephone Encounter (Signed)
Needs to be done by previous prescribing physician or nephrologist

## 2018-04-15 DIAGNOSIS — E10621 Type 1 diabetes mellitus with foot ulcer: Secondary | ICD-10-CM | POA: Diagnosis not present

## 2018-04-17 ENCOUNTER — Other Ambulatory Visit: Payer: Medicare Other

## 2018-04-17 ENCOUNTER — Ambulatory Visit
Admission: RE | Admit: 2018-04-17 | Discharge: 2018-04-17 | Disposition: A | Discharge status: Home / Self Care | Payer: Medicare Other | Source: Ambulatory Visit | Attending: Endocrinology | Admitting: Endocrinology

## 2018-04-17 ENCOUNTER — Ambulatory Visit: Payer: Medicare Other

## 2018-04-17 DIAGNOSIS — N6310 Unspecified lump in the right breast, unspecified quadrant: Secondary | ICD-10-CM

## 2018-04-22 DIAGNOSIS — E10621 Type 1 diabetes mellitus with foot ulcer: Secondary | ICD-10-CM | POA: Diagnosis not present

## 2018-04-29 ENCOUNTER — Other Ambulatory Visit (HOSPITAL_COMMUNITY)
Admission: RE | Admit: 2018-04-29 | Discharge: 2018-04-29 | Disposition: A | Discharge status: Home / Self Care | Payer: Medicare Other | Source: Other Acute Inpatient Hospital | Attending: Internal Medicine | Admitting: Internal Medicine

## 2018-04-29 DIAGNOSIS — E10621 Type 1 diabetes mellitus with foot ulcer: Secondary | ICD-10-CM | POA: Diagnosis not present

## 2018-04-29 DIAGNOSIS — S91302A Unspecified open wound, left foot, initial encounter: Secondary | ICD-10-CM | POA: Diagnosis present

## 2018-05-01 ENCOUNTER — Other Ambulatory Visit (HOSPITAL_COMMUNITY): Payer: Self-pay | Admitting: Internal Medicine

## 2018-05-01 ENCOUNTER — Other Ambulatory Visit: Payer: Self-pay | Admitting: Internal Medicine

## 2018-05-01 DIAGNOSIS — L98499 Non-pressure chronic ulcer of skin of other sites with unspecified severity: Secondary | ICD-10-CM

## 2018-05-02 LAB — AEROBIC CULTURE W GRAM STAIN (SUPERFICIAL SPECIMEN)

## 2018-05-04 ENCOUNTER — Other Ambulatory Visit: Payer: Self-pay | Admitting: Endocrinology

## 2018-05-05 ENCOUNTER — Ambulatory Visit (HOSPITAL_COMMUNITY)
Admission: RE | Admit: 2018-05-05 | Discharge: 2018-05-05 | Disposition: A | Discharge status: Home / Self Care | Payer: Medicare Other | Source: Ambulatory Visit | Attending: Internal Medicine | Admitting: Internal Medicine

## 2018-05-05 DIAGNOSIS — L98499 Non-pressure chronic ulcer of skin of other sites with unspecified severity: Secondary | ICD-10-CM | POA: Insufficient documentation

## 2018-05-06 ENCOUNTER — Encounter (HOSPITAL_BASED_OUTPATIENT_CLINIC_OR_DEPARTMENT_OTHER): Payer: Medicare Other | Attending: Internal Medicine

## 2018-05-06 DIAGNOSIS — I10 Essential (primary) hypertension: Secondary | ICD-10-CM | POA: Insufficient documentation

## 2018-05-06 DIAGNOSIS — L97522 Non-pressure chronic ulcer of other part of left foot with fat layer exposed: Secondary | ICD-10-CM | POA: Insufficient documentation

## 2018-05-06 DIAGNOSIS — B9561 Methicillin susceptible Staphylococcus aureus infection as the cause of diseases classified elsewhere: Secondary | ICD-10-CM | POA: Diagnosis not present

## 2018-05-06 DIAGNOSIS — L03116 Cellulitis of left lower limb: Secondary | ICD-10-CM | POA: Diagnosis not present

## 2018-05-06 DIAGNOSIS — E1051 Type 1 diabetes mellitus with diabetic peripheral angiopathy without gangrene: Secondary | ICD-10-CM | POA: Diagnosis not present

## 2018-05-06 DIAGNOSIS — E10621 Type 1 diabetes mellitus with foot ulcer: Secondary | ICD-10-CM | POA: Insufficient documentation

## 2018-05-06 DIAGNOSIS — Z86718 Personal history of other venous thrombosis and embolism: Secondary | ICD-10-CM | POA: Diagnosis not present

## 2018-05-06 DIAGNOSIS — G4730 Sleep apnea, unspecified: Secondary | ICD-10-CM | POA: Insufficient documentation

## 2018-05-06 DIAGNOSIS — E1040 Type 1 diabetes mellitus with diabetic neuropathy, unspecified: Secondary | ICD-10-CM | POA: Diagnosis not present

## 2018-05-06 DIAGNOSIS — L97512 Non-pressure chronic ulcer of other part of right foot with fat layer exposed: Secondary | ICD-10-CM | POA: Diagnosis not present

## 2018-05-07 ENCOUNTER — Telehealth: Payer: Self-pay | Admitting: Endocrinology

## 2018-05-07 NOTE — Telephone Encounter (Signed)
Called patient left her a message to call us and schedule an appointment with you, labs, and that it would need to be on a  Monday, Tuesday, or Wednesday.

## 2018-05-09 IMAGING — DX DG FOOT COMPLETE 3+V*L*
3 series · 3 of 3 positions shown · non-contrast
Comparison: 10/15/2016

CLINICAL DATA: Bilateral foot wound infection with right foot pain
greater than left. No known injury.

EXAM:
LEFT FOOT - COMPLETE 3+ VIEW

[foot ap]
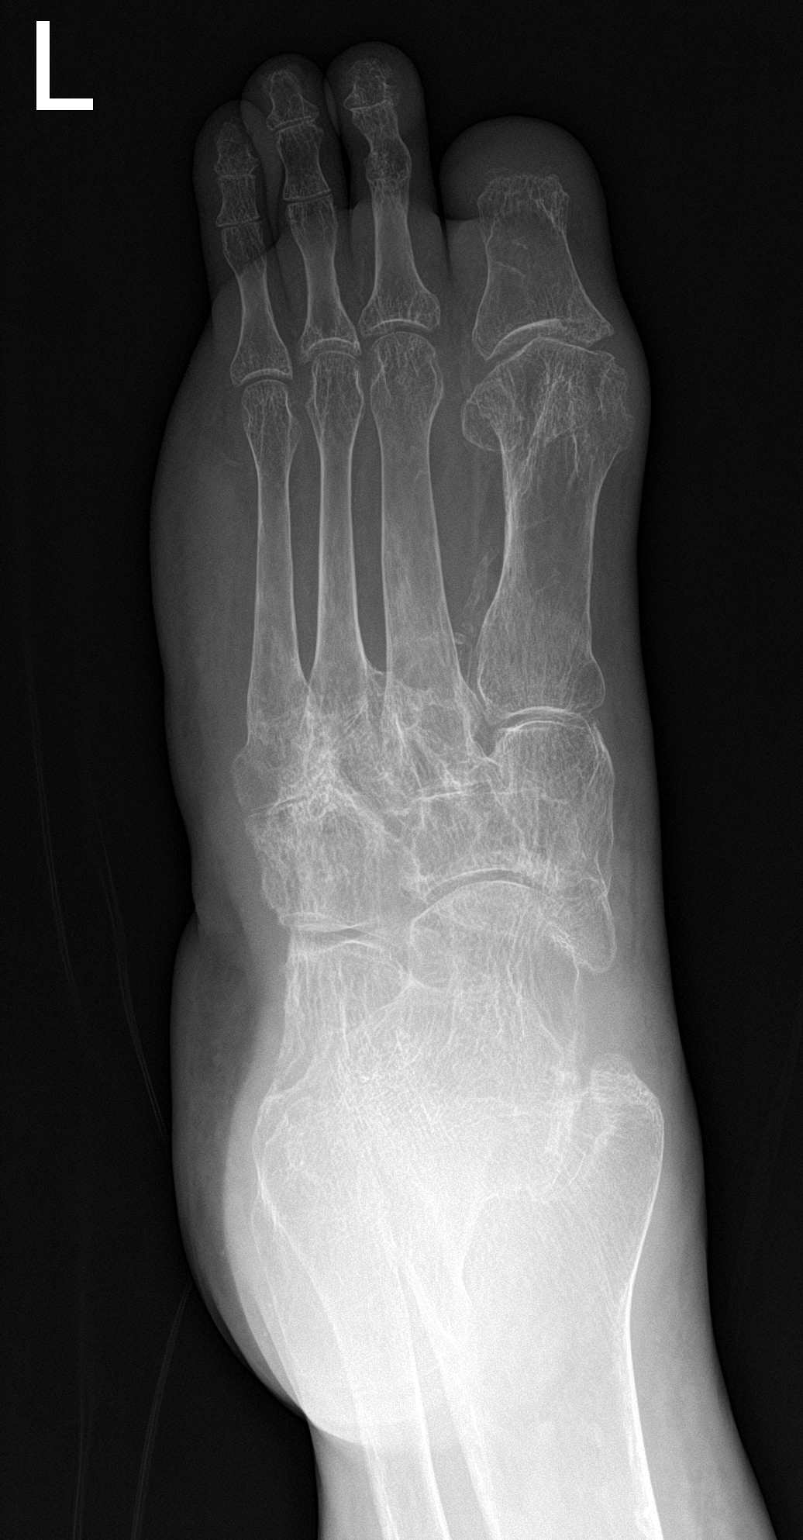

[foot obl]
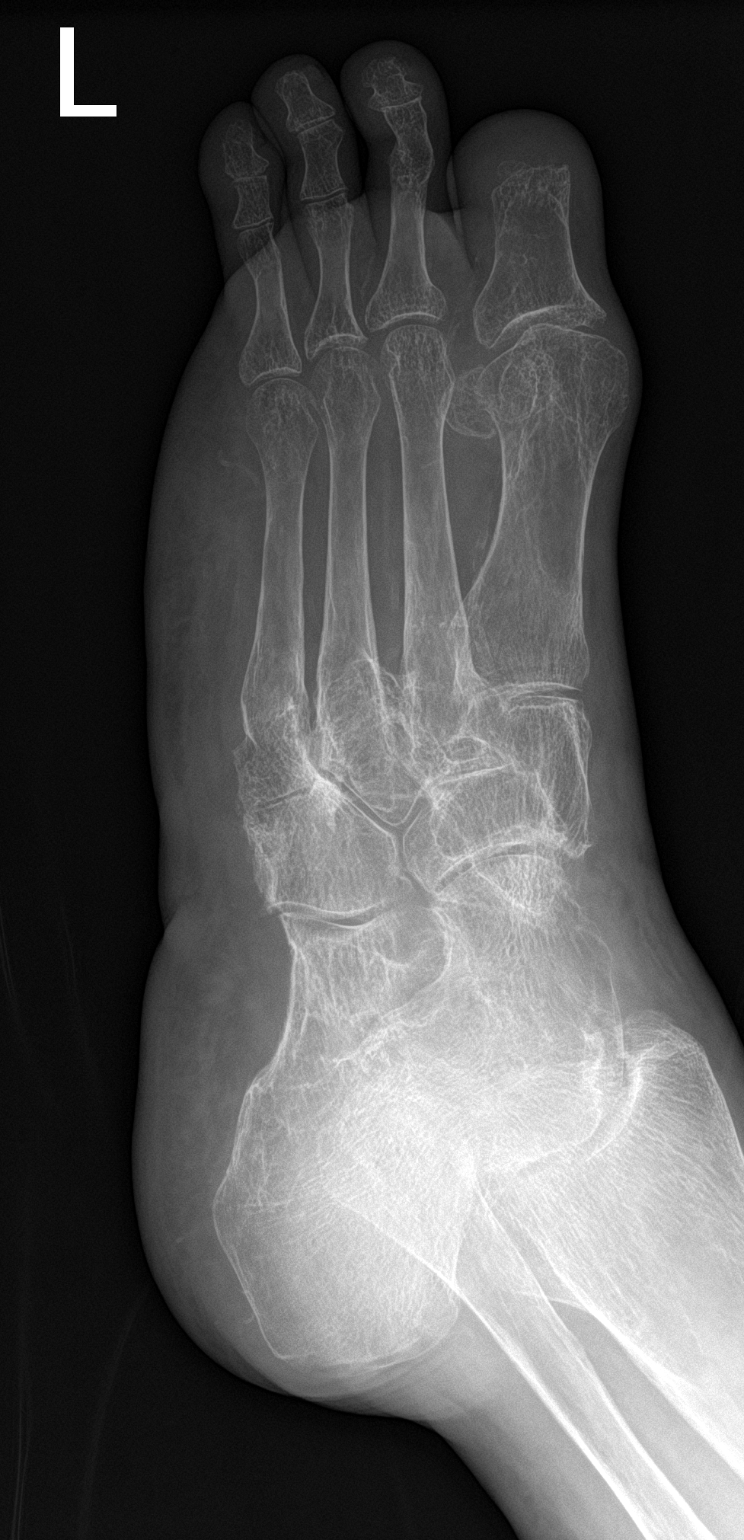

[foot lat]
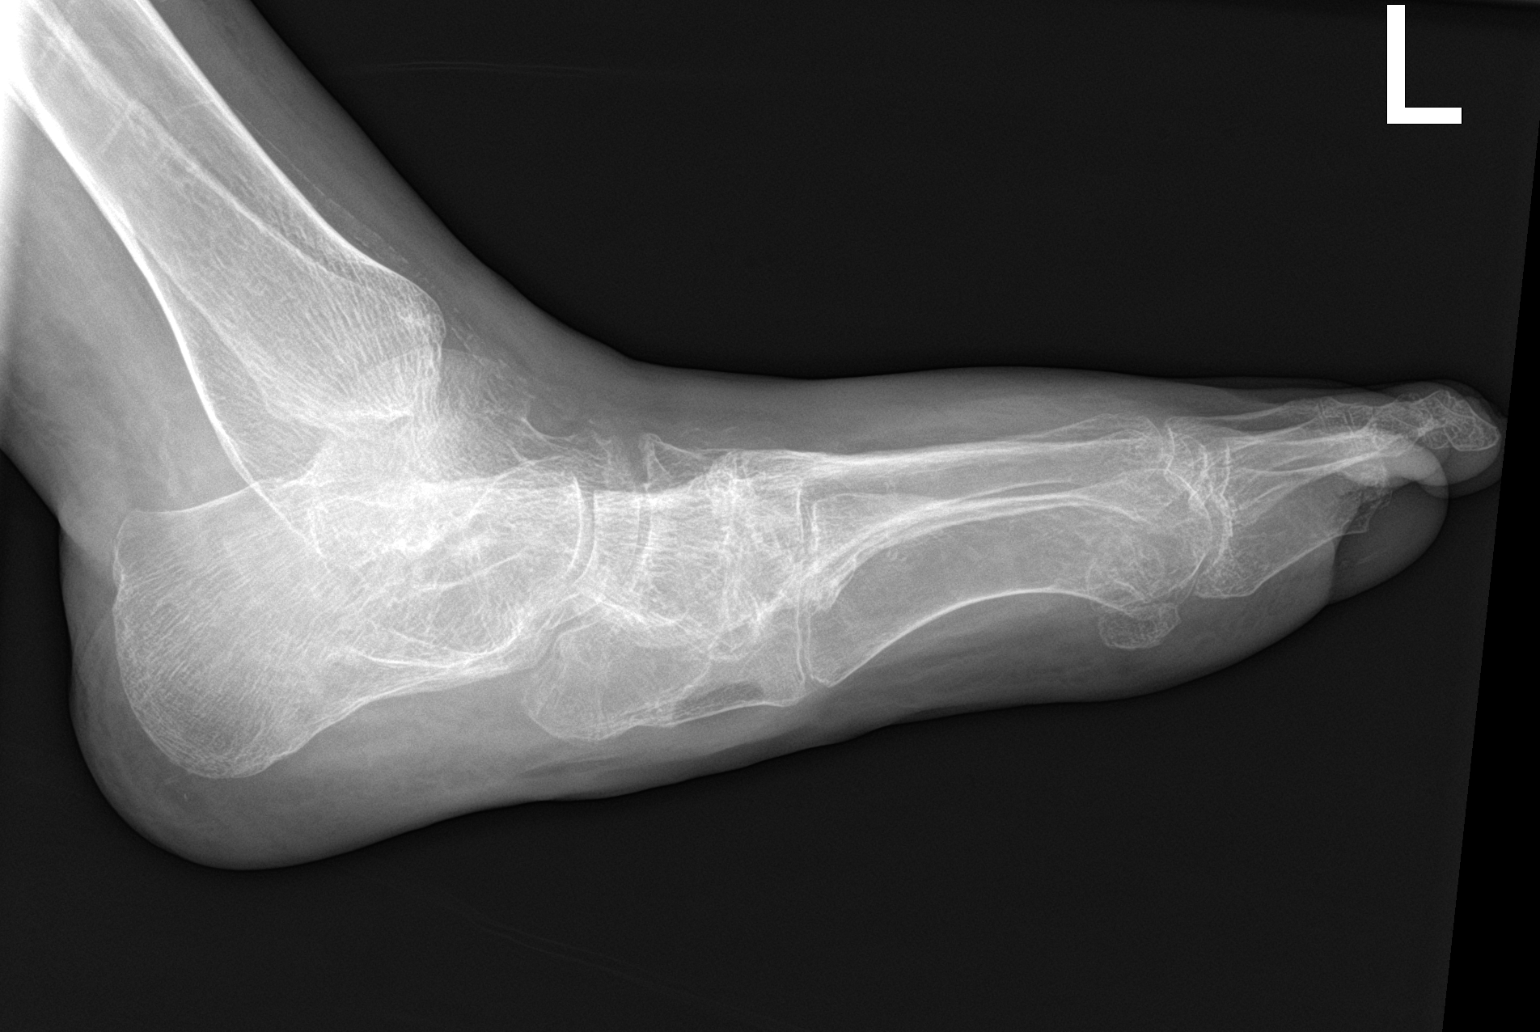

[3 of 3 positions shown; findings below may reference images not displayed]

FINDINGS: Diffuse osteopenia of the left foot with amputation of the fifth ray
and distal phalanx of the great toe. Ankylosed appearance of the
second PIP joint. Osteoarthritic joint space narrowing and sclerosis
at the base of the second, third and fourth metatarsals.
Osteoarthritic sclerosis and joint space narrowing of the tarsal
bones of the midfoot.

[place]o configuration of the midfoot with soft tissue
ulcerations and swelling along the plantar aspect of the mid foot.
Forefoot soft tissue swelling is also noted. No radiographic
evidence of osteomyelitis or acute fracture.
IMPRESSION: 1. Soft tissue swelling with plantar midfoot soft tissue
ulcerations.
2. Osteopenia of the left foot without radiographic evidence of
osteomyelitis.
3. Generalized osteopenia with midfoot osteoarthritis.
4. Amputation of the fifth ray and distal phalanx of the great toe.
5. Ankylosed second PIP joint.

## 2018-05-13 DIAGNOSIS — E10621 Type 1 diabetes mellitus with foot ulcer: Secondary | ICD-10-CM | POA: Diagnosis not present

## 2018-05-20 DIAGNOSIS — E10621 Type 1 diabetes mellitus with foot ulcer: Secondary | ICD-10-CM | POA: Diagnosis not present

## 2018-05-27 DIAGNOSIS — E10621 Type 1 diabetes mellitus with foot ulcer: Secondary | ICD-10-CM | POA: Diagnosis not present

## 2018-05-28 ENCOUNTER — Other Ambulatory Visit: Payer: Self-pay | Admitting: Endocrinology

## 2018-05-29 IMAGING — MR MR FOOT*R* W/O CM
4 of 5 series · 28 of 40 positions shown · non-contrast
Comparison: None.

CLINICAL DATA: Swelling of the foot.  Prior amputation.  The

EXAM:
MRI OF THE RIGHT FOREFOOT WITHOUT CONTRAST
TECHNIQUE: Multiplanar, multisequence MR imaging of the ankle was performed. No
intravenous contrast was administered.

[Series 6: ti short axis · coronal · right · 4.0mm · 0.31mm/px · 8 of 34 slices shown]
[im 1/34]
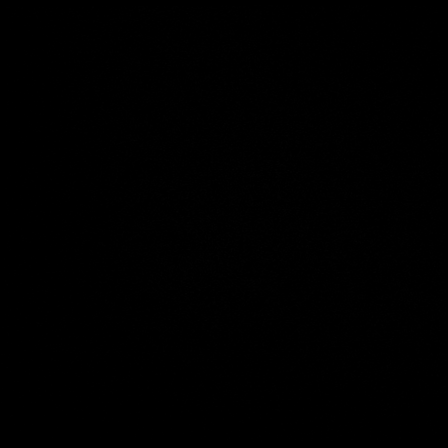
[im 5/34]
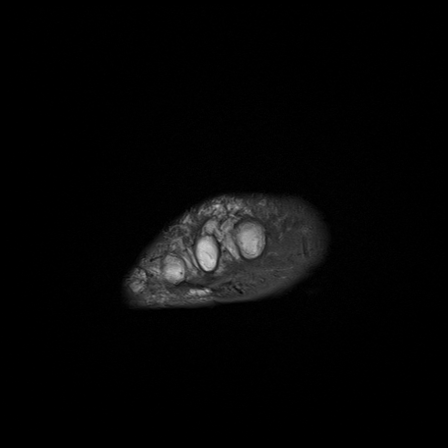
[im 9/34]
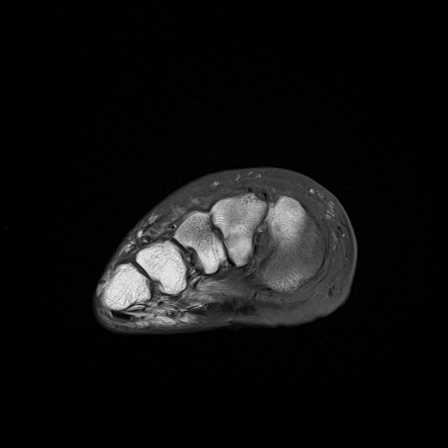
[im 13/34]
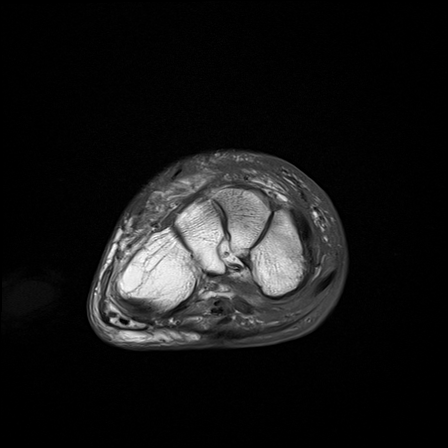
[im 17/34]
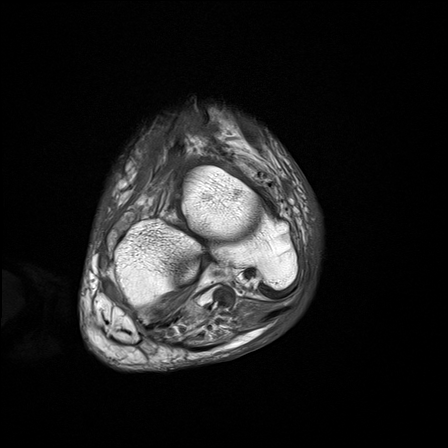
[im 21/34]
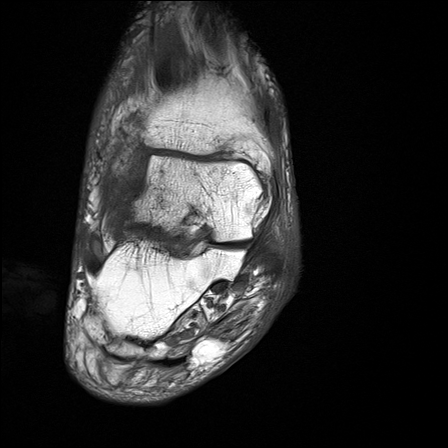
[im 25/34]
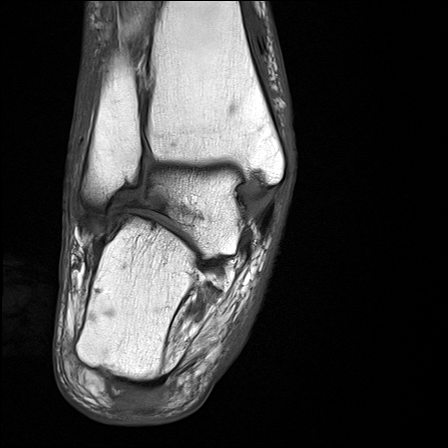
[im 29/34]
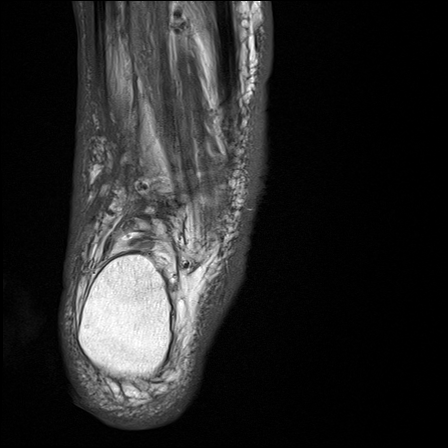

[Series 7: T2 fat-sat · coronal · right · 4.0mm · 0.31mm/px · 9 of 34 slices shown]
[im 1/34]
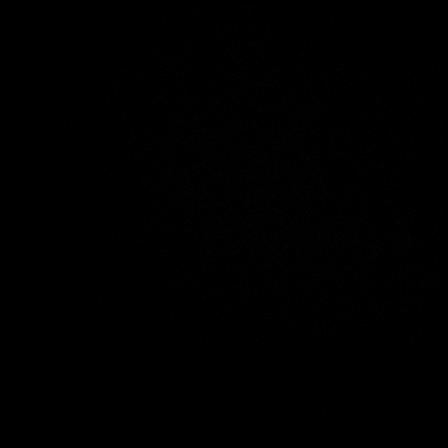
[im 5/34]
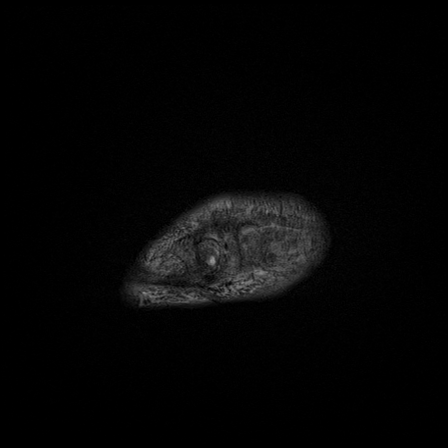
[im 9/34]
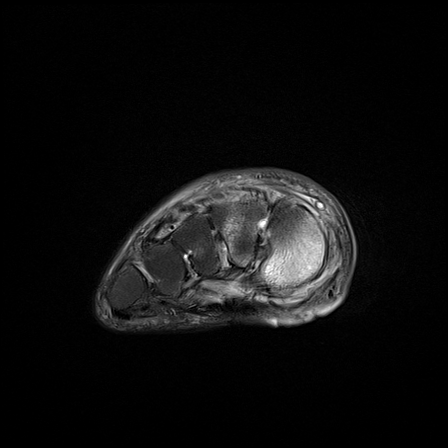
[im 13/34]
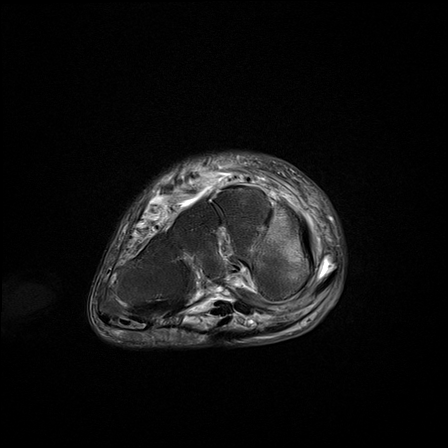
[im 17/34]
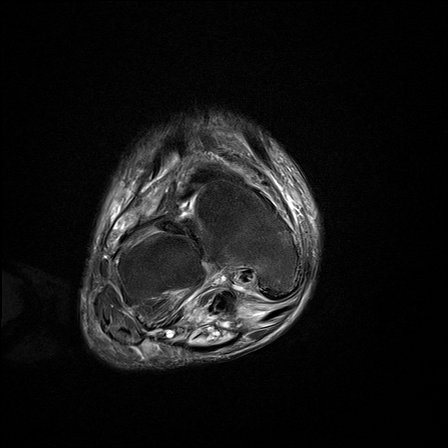
[im 21/34]
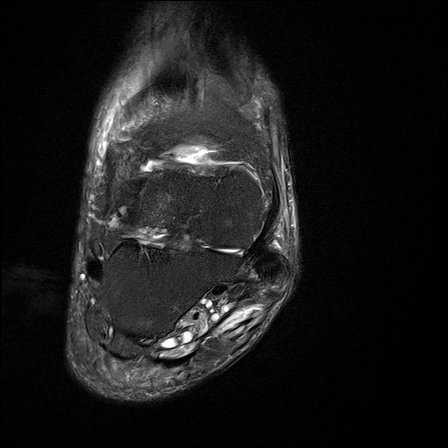
[im 25/34]
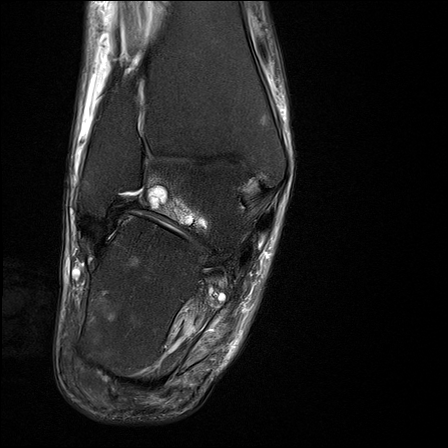
[im 29/34]
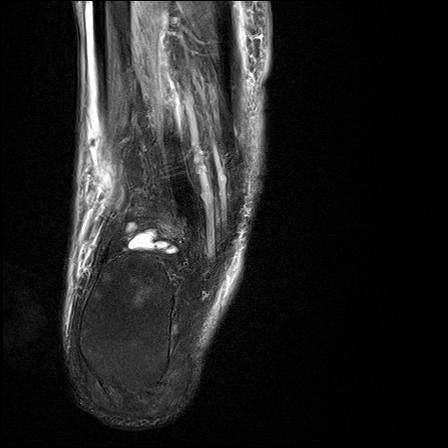
[im 34/34]
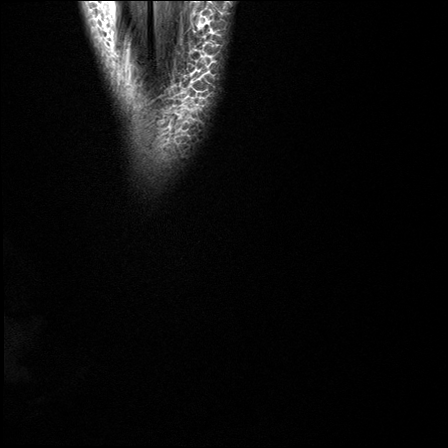

[Series 8: T1 · axial · right · 3.0mm · 0.47mm/px · z∈[-159,-71]mm · 8 of 29 slices shown]
[im 1/29]
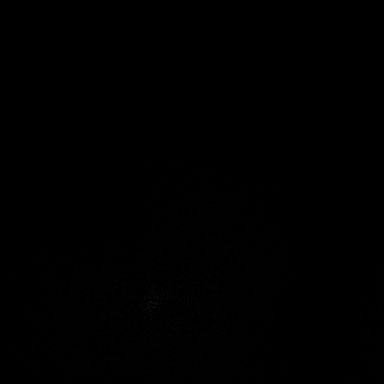
[im 5/29]
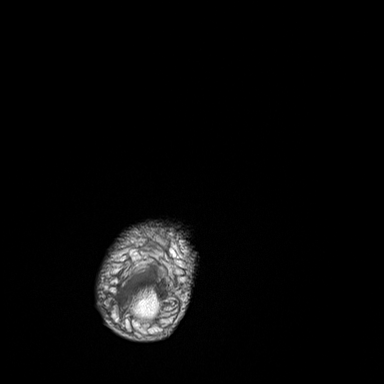
[im 9/29]
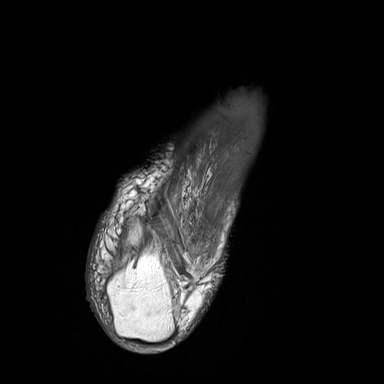
[im 13/29]
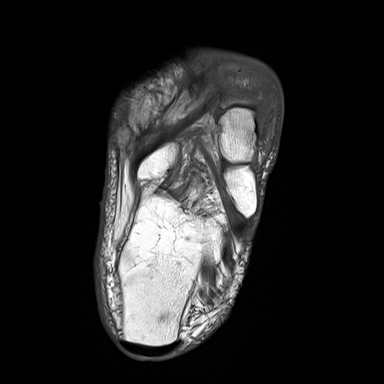
[im 17/29]
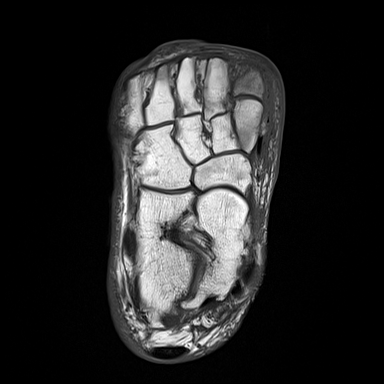
[im 21/29]
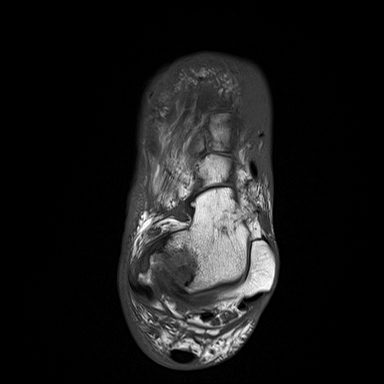
[im 25/29]
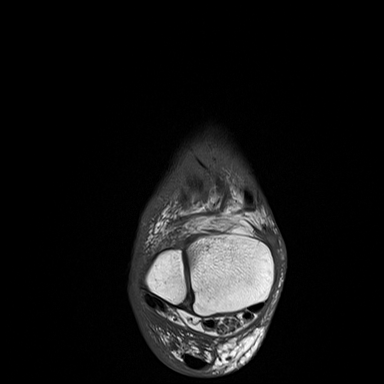
[im 29/29]
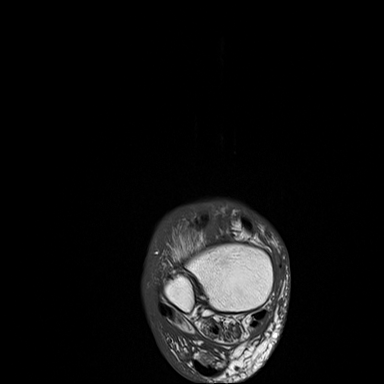

[Series 9: STIR · axial · right · 3.0mm · 0.47mm/px · z∈[-147,-83]mm · 3 of 29 slices shown]
[im 5/29]
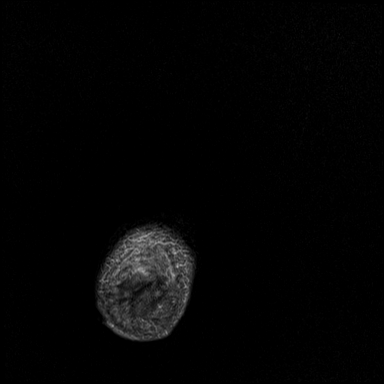
[im 17/29]
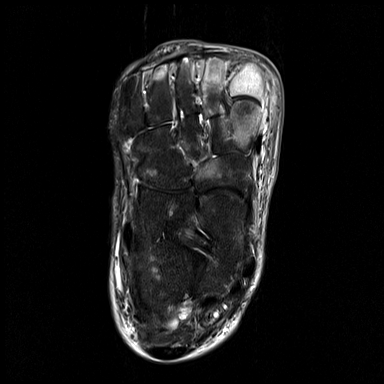
[im 25/29]
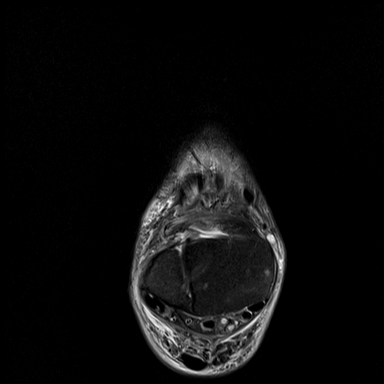

[28 of 40 positions shown; findings below may reference images not displayed]

FINDINGS: TENDONS

Peroneal: Peroneal longus tendon intact. Peroneal brevis intact.

Posteromedial: Posterior tibial tendon intact. Flexor hallucis
longus tendon intact. Flexor digitorum longus tendon intact.

Anterior: Tibialis anterior tendon intact. Extensor hallucis longus
tendon intact Extensor digitorum longus tendon intact.

Achilles:  Intact.

Plantar Fascia: Intact.

LIGAMENTS

Lateral: Anterior talofibular ligament intact. Calcaneofibular
ligament intact. Posterior talofibular ligament intact. Anterior and
posterior tibiofibular ligaments intact.

Medial: Deltoid ligament intact. Spring ligament intact.

Ankle Joint: Small joint effusion. Normal ankle mortise. No chondral
defect.

Subtalar Joints/Sinus Tarsi: Subchondral marrow edema with overlying
high-grade partial-thickness cartilage loss involving the posterior
subtalar joint along the talar surface.

Bones and soft tissue: Prior transmetatarsal amputation. Soft tissue
wound along the plantar aspect of the first metatarsal base.
Cortical regularity and severe marrow edema in the first metatarsal
stump concerning for osteomyelitis. Mild marrow edema in the second
metatarsal stump without cortical destruction which may reflect
reactive edema. Mild marrow edema in the distal lateral aspect of
the navicular. No fluid collection or hematoma.
IMPRESSION: 1. Soft tissue ulcer along the plantar aspect of the first
metatarsal base with osteomyelitis of the first metatarsal stump.
2. Mild marrow edema in the second metatarsal stump without cortical
destruction which likely reflects reactive marrow edema secondary to
adjacent inflammation.

## 2018-05-30 IMAGING — MG 2D DIGITAL SCREENING BILATERAL MAMMOGRAM WITH CAD AND ADJUNCT TO
9 of 12 series · 9 of 28 positions shown · non-contrast
Comparison: Previous exam(s).

CLINICAL DATA: Screening.

EXAM:
2D DIGITAL SCREENING BILATERAL MAMMOGRAM WITH CAD AND ADJUNCT TOMO

[R CC synth-2D]
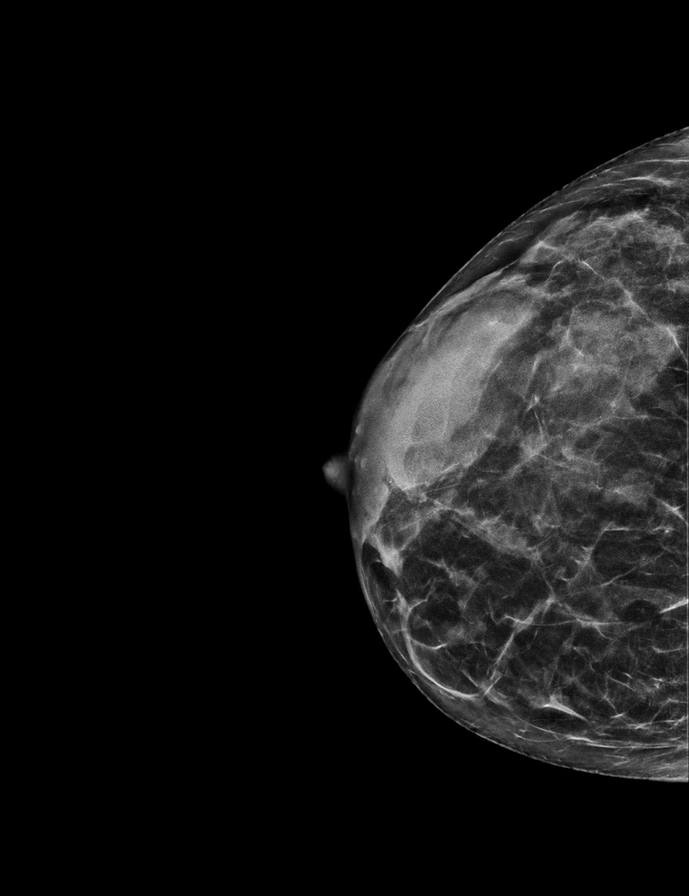

[R CC]
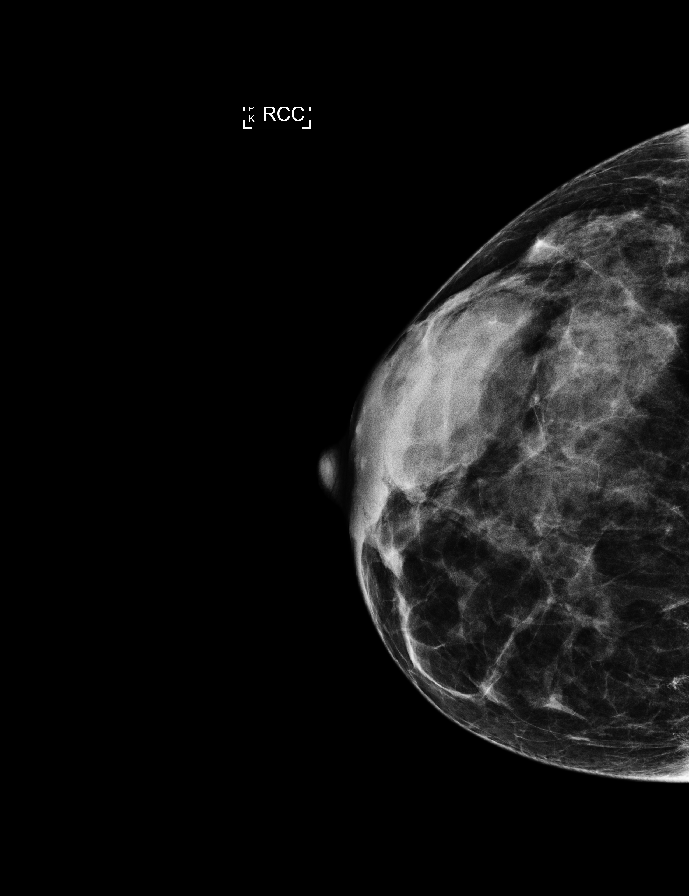

[L CC]
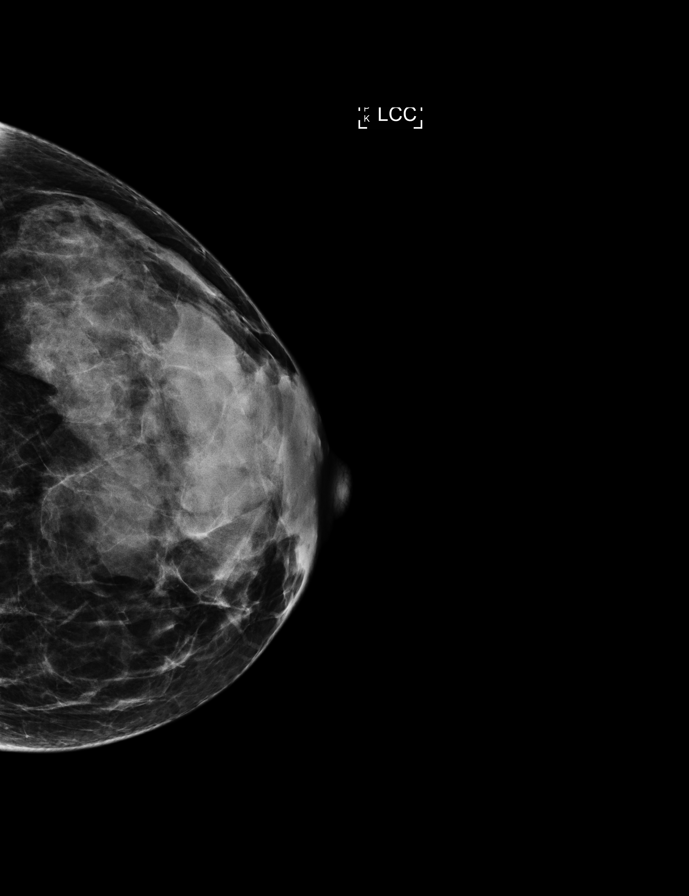

[L MLO synth-2D]
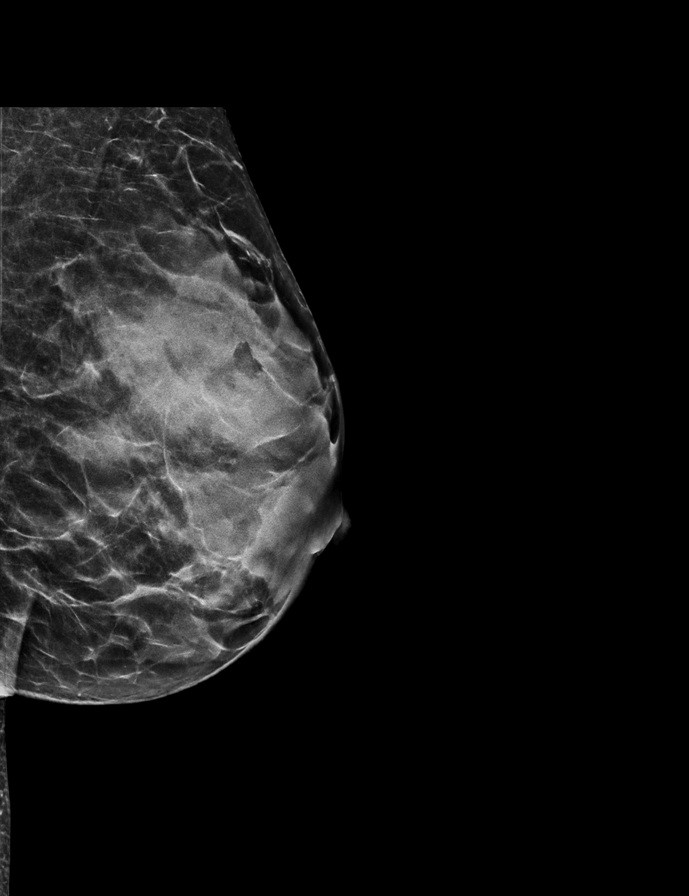

[L MLO]
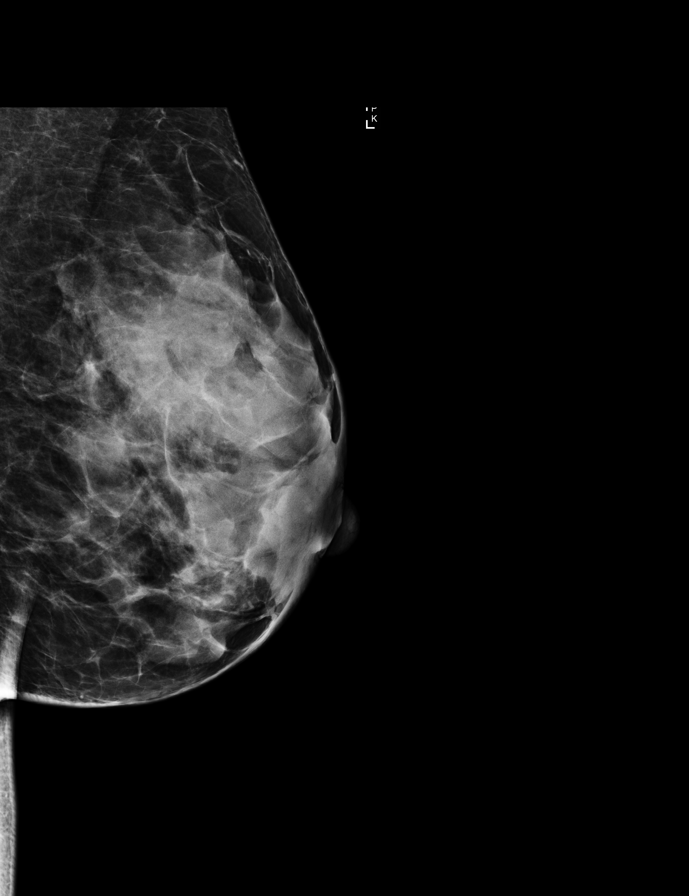

[L CC synth-2D]
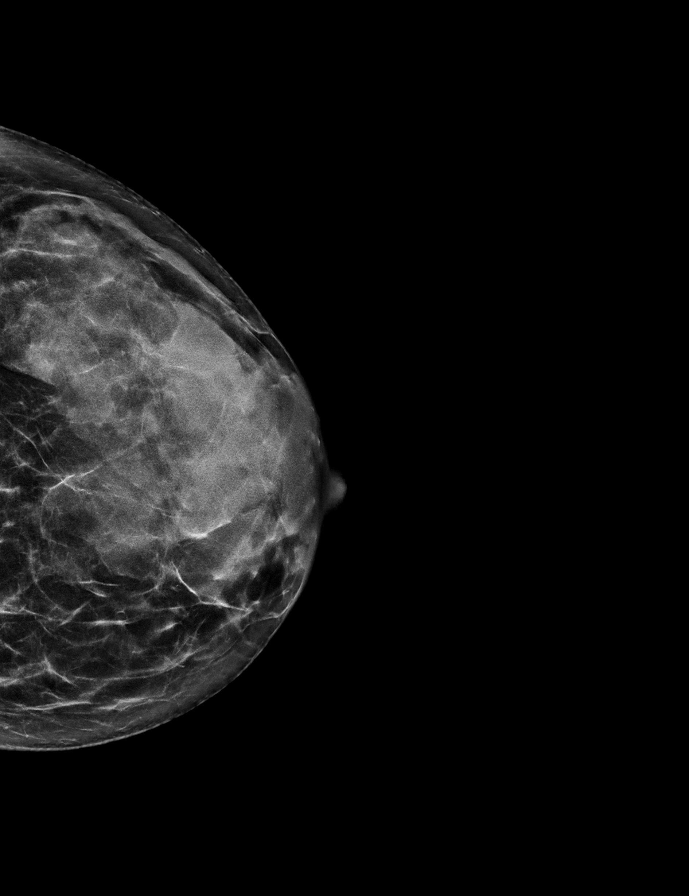

[R MLO]
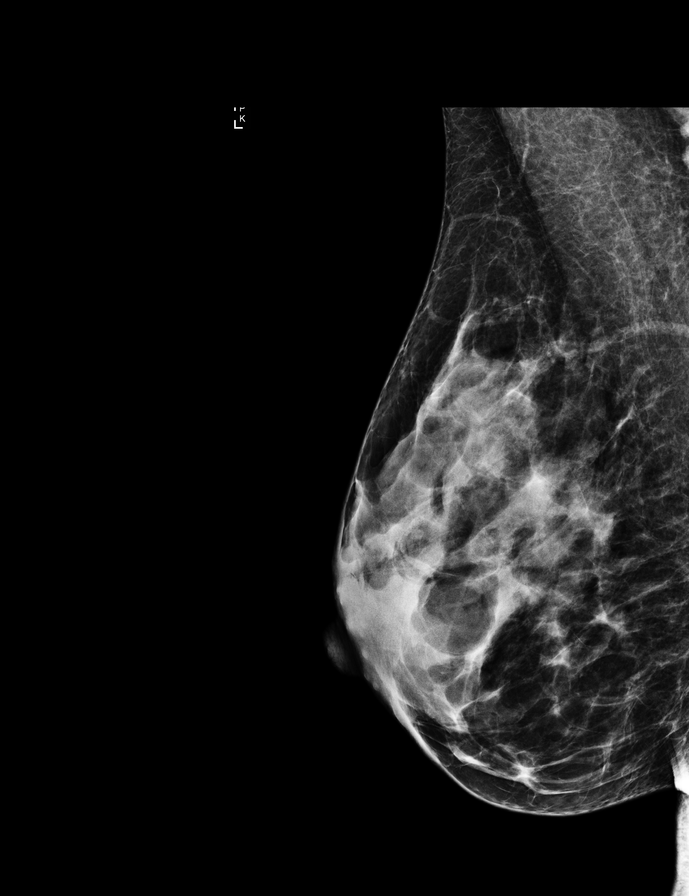

[R MLO synth-2D]
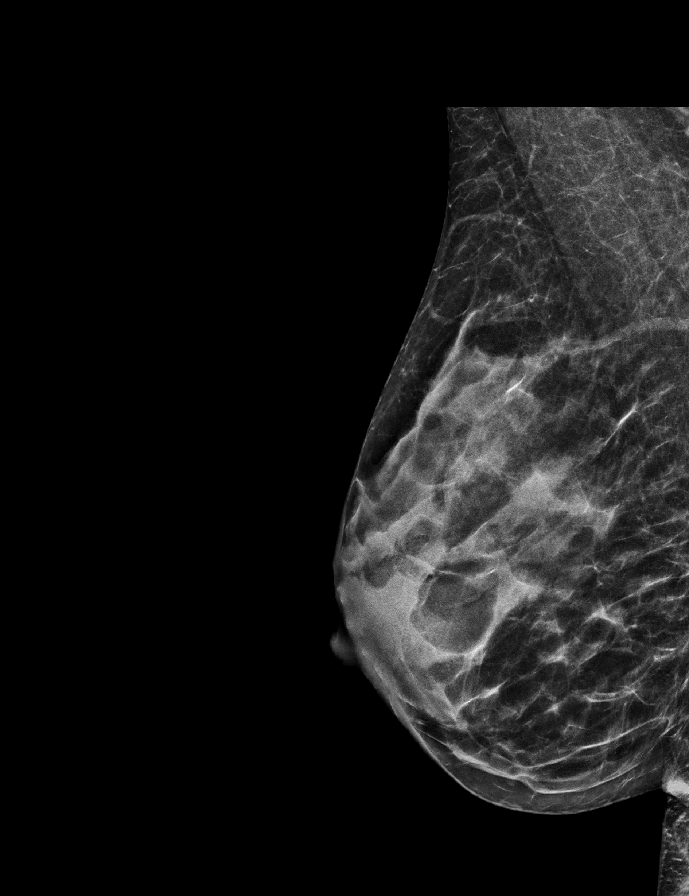

[L MLO tomo · tomo slice 27/52.0]
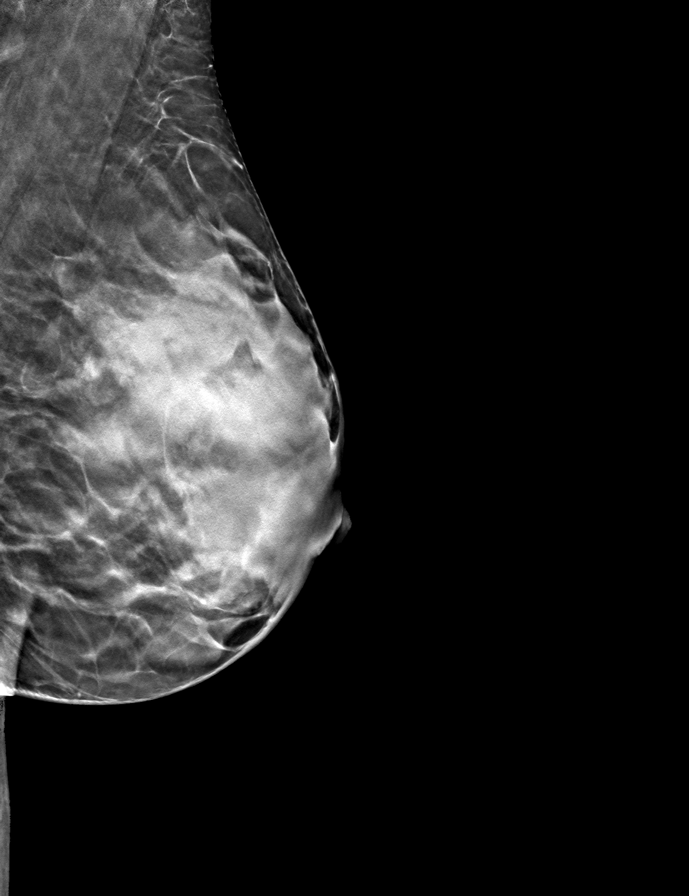

[9 of 28 positions shown; findings below may reference images not displayed]

ACR Breast Density Category c: The breast tissue is heterogeneously
dense, which may obscure small masses.
FINDINGS: There are no findings suspicious for malignancy. Images were
processed with CAD.
IMPRESSION: No mammographic evidence of malignancy. A result letter of this
screening mammogram will be mailed directly to the patient.

RECOMMENDATION:
Screening mammogram in one year. (Code:[id number])

BI-RADS CATEGORY  1: Negative.

## 2018-06-03 ENCOUNTER — Encounter (HOSPITAL_BASED_OUTPATIENT_CLINIC_OR_DEPARTMENT_OTHER): Payer: Medicare Other | Attending: Internal Medicine

## 2018-06-03 DIAGNOSIS — L84 Corns and callosities: Secondary | ICD-10-CM | POA: Insufficient documentation

## 2018-06-03 DIAGNOSIS — E1040 Type 1 diabetes mellitus with diabetic neuropathy, unspecified: Secondary | ICD-10-CM | POA: Insufficient documentation

## 2018-06-03 DIAGNOSIS — E10622 Type 1 diabetes mellitus with other skin ulcer: Secondary | ICD-10-CM | POA: Insufficient documentation

## 2018-06-03 DIAGNOSIS — L97412 Non-pressure chronic ulcer of right heel and midfoot with fat layer exposed: Secondary | ICD-10-CM | POA: Insufficient documentation

## 2018-06-03 DIAGNOSIS — Z86718 Personal history of other venous thrombosis and embolism: Secondary | ICD-10-CM | POA: Insufficient documentation

## 2018-06-03 DIAGNOSIS — I10 Essential (primary) hypertension: Secondary | ICD-10-CM | POA: Diagnosis not present

## 2018-06-03 DIAGNOSIS — B952 Enterococcus as the cause of diseases classified elsewhere: Secondary | ICD-10-CM | POA: Insufficient documentation

## 2018-06-03 DIAGNOSIS — G4730 Sleep apnea, unspecified: Secondary | ICD-10-CM | POA: Insufficient documentation

## 2018-06-03 DIAGNOSIS — L97512 Non-pressure chronic ulcer of other part of right foot with fat layer exposed: Secondary | ICD-10-CM | POA: Diagnosis not present

## 2018-06-03 DIAGNOSIS — L97522 Non-pressure chronic ulcer of other part of left foot with fat layer exposed: Secondary | ICD-10-CM | POA: Diagnosis not present

## 2018-06-03 DIAGNOSIS — E1051 Type 1 diabetes mellitus with diabetic peripheral angiopathy without gangrene: Secondary | ICD-10-CM | POA: Diagnosis not present

## 2018-06-04 ENCOUNTER — Other Ambulatory Visit (HOSPITAL_COMMUNITY)
Admission: RE | Admit: 2018-06-04 | Discharge: 2018-06-04 | Disposition: A | Discharge status: Home / Self Care | Payer: Medicare Other | Source: Other Acute Inpatient Hospital | Attending: Internal Medicine | Admitting: Internal Medicine

## 2018-06-04 DIAGNOSIS — M722 Plantar fascial fibromatosis: Secondary | ICD-10-CM | POA: Diagnosis present

## 2018-06-10 ENCOUNTER — Telehealth: Payer: Self-pay | Admitting: Endocrinology

## 2018-06-10 LAB — AEROBIC CULTURE  (SUPERFICIAL SPECIMEN)

## 2018-06-10 LAB — AEROBIC CULTURE W GRAM STAIN (SUPERFICIAL SPECIMEN)

## 2018-06-10 NOTE — Telephone Encounter (Signed)
Will you do or does she need to see PCP?

## 2018-06-10 NOTE — Telephone Encounter (Signed)
Patient requests that when she gets her labs done that extra labs be done at the same time since it is difficult for patient. Patient would like to be tested for any bad things she may have gotten (e.g. STD, etc). Please call patient at ph# [phone] to advise.

## 2018-06-10 NOTE — Telephone Encounter (Signed)
Called and left detailed voicemail and stated that PCP would have to order additional labs if she would like any more blood work to be drawn.

## 2018-06-10 NOTE — Telephone Encounter (Signed)
She will have to get the lab order from her PCP for it to be done with diagnoses

## 2018-06-12 ENCOUNTER — Other Ambulatory Visit (INDEPENDENT_AMBULATORY_CARE_PROVIDER_SITE_OTHER): Payer: Medicare Other

## 2018-06-12 ENCOUNTER — Other Ambulatory Visit: Payer: Self-pay

## 2018-06-12 DIAGNOSIS — E1165 Type 2 diabetes mellitus with hyperglycemia: Secondary | ICD-10-CM | POA: Diagnosis not present

## 2018-06-12 DIAGNOSIS — E1065 Type 1 diabetes mellitus with hyperglycemia: Secondary | ICD-10-CM | POA: Diagnosis not present

## 2018-06-12 DIAGNOSIS — E7800 Pure hypercholesterolemia, unspecified: Secondary | ICD-10-CM

## 2018-06-12 DIAGNOSIS — E10622 Type 1 diabetes mellitus with other skin ulcer: Secondary | ICD-10-CM | POA: Diagnosis not present

## 2018-06-12 LAB — COMPREHENSIVE METABOLIC PANEL
ALT: 45 U/L — ABNORMAL HIGH (ref 0–35)
AST: 32 U/L (ref 0–37)
Albumin: 3.8 g/dL (ref 3.5–5.2)
Alkaline Phosphatase: 235 U/L — ABNORMAL HIGH (ref 39–117)
BUN: 86 mg/dL (ref 6–23)
CHLORIDE: 102 meq/L (ref 96–112)
CO2: 19 meq/L (ref 19–32)
Calcium: 7.6 mg/dL — ABNORMAL LOW (ref 8.4–10.5)
Creatinine, Ser: 3.85 mg/dL — ABNORMAL HIGH (ref 0.40–1.20)
GFR: 12.57 mL/min — CL (ref >60.00)
Glucose, Bld: 162 mg/dL — ABNORMAL HIGH (ref 70–99)
Potassium: 4.2 mEq/L (ref 3.5–5.1)
Sodium: 137 mEq/L (ref 135–145)
Total Bilirubin: 0.3 mg/dL (ref 0.2–1.2)
Total Protein: 7.5 g/dL (ref 6.0–8.3)

## 2018-06-12 LAB — GLUCOSE, RANDOM: GLUCOSE: 162 mg/dL — AB (ref 70–99)

## 2018-06-12 LAB — LIPID PANEL
CHOL/HDL RATIO: 6
Cholesterol: 152 mg/dL (ref 0–200)
HDL: 24.4 mg/dL — ABNORMAL LOW (ref >39.00)
NONHDL: 127.13
Triglycerides: 222 mg/dL — ABNORMAL HIGH (ref 0.0–149.0)
VLDL: 44.4 mg/dL — ABNORMAL HIGH (ref 0.0–40.0)

## 2018-06-12 LAB — LDL CHOLESTEROL, DIRECT: Direct LDL: 82 mg/dL

## 2018-06-12 LAB — HEMOGLOBIN A1C: Hgb A1c MFr Bld: 10.3 % — ABNORMAL HIGH (ref 4.6–6.5)

## 2018-06-17 ENCOUNTER — Other Ambulatory Visit: Payer: Medicare Other

## 2018-06-17 DIAGNOSIS — E10622 Type 1 diabetes mellitus with other skin ulcer: Secondary | ICD-10-CM | POA: Diagnosis not present

## 2018-06-24 ENCOUNTER — Other Ambulatory Visit: Payer: Self-pay

## 2018-06-24 DIAGNOSIS — E10622 Type 1 diabetes mellitus with other skin ulcer: Secondary | ICD-10-CM | POA: Diagnosis not present

## 2018-06-25 ENCOUNTER — Ambulatory Visit (INDEPENDENT_AMBULATORY_CARE_PROVIDER_SITE_OTHER): Payer: Medicare Other | Admitting: Endocrinology

## 2018-06-25 ENCOUNTER — Encounter: Payer: Self-pay | Admitting: Endocrinology

## 2018-06-25 ENCOUNTER — Encounter: Payer: Medicare Other | Attending: Endocrinology | Admitting: Nutrition

## 2018-06-25 ENCOUNTER — Other Ambulatory Visit: Payer: Self-pay | Admitting: Endocrinology

## 2018-06-25 VITALS — BP 140/70 | HR 90 | Temp 99.0°F | Ht 62.0 in | Wt 131.4 lb

## 2018-06-25 DIAGNOSIS — E1065 Type 1 diabetes mellitus with hyperglycemia: Secondary | ICD-10-CM

## 2018-06-25 DIAGNOSIS — E1021 Type 1 diabetes mellitus with diabetic nephropathy: Secondary | ICD-10-CM | POA: Diagnosis present

## 2018-06-25 NOTE — Progress Notes (Addendum)
Patient ID: [name], female   DOB: [birth year], 48 y.o.   MRN: [id number]   Reason for Appointment: follow-up of diabetes  History of Present Illness   Diagnosis: Type 1 DIABETES MELITUS  Previous history: She has had long-standing poorly controlled diabetes and typically has poor compliance with self care measures despite periodic diabetes education and periodic followup  INSULIN PUMP regimen:   Currently using MEDTRONIC 723 model   Basal rates: Midnight to 3 AM = 0.5, at 3 AM = 0.4, 6 AM = 0.4 and at 11:30 AM = 0.8 until 10 PM, 10 PM = 0.75   Carb coverage 1: 20 g and correction 1: 70 during the day and 80 from 10 PM-8 AM Glucose target 150, active insulin 4 hours         Recent history:   Her last A1c was 10.3, previously 10.1 and about the same as before   Current blood sugar patterns, difficulties with management and problems identified:   Her blood sugars are consistently very poorly controlled  She again claims that she has had hypoglycemic episodes requiring EMS to visit her  She was told to review doing the temporary basal with the nurse educator but she did not make an appointment  As before she will suspend her pump for several hours if her sugar is getting low causing her to have marked hyperglycemia most of the time  However she appears to be doing this sometimes even when her blood sugar is not low only because she is afraid of getting low when she is going to sleep  Most of the time she is trying to eat before she is going to sleep  Has only minimal boluses, mostly when her blood sugars are persistently high and has entered carbohydrates only rarely for bolus  She reports that she had passed out from hypoglycemia twice around 4-5 PM in the last couple of weeks and once after about 1 or 2 AM.  She said that she has a roommate who does not know anything about treating low blood sugars  Currently her sensitivity is 1: 80 and she thinks  that even 5 units of correction doses will not bring her blood sugar down  She has not had her CGM active for the last 2 weeks However previous blood sugar patterns:  AVERAGE glucose 250.  Will be 22% readings within range and 2% below 70  Hypoglycemic tendency present mostly between 6 AM and 1 AM  Persistently high blood sugars between 1 AM and 3 PM Variability index 45%    Meals: eating at variable times and also having snacks periodically.  Her daily carbohydrate intake is quite variable Physical activity: exercise: none         Dietician visit: Most recent:?          Complications: are: Neuropathy, nephropathy, diabetic foot ulcer      Wt Readings from Last 3 Encounters:  06/25/18 131 lb 6.4 oz (59.6 kg)  03/21/18 125 lb 9.6 oz (57 kg)  01/15/18 123 lb (55.8 kg)   Lab Results  Component Value Date   HGBA1C 10.3 (H) 06/12/2018   HGBA1C 10.1 (H) 01/13/2018   HGBA1C 10.8 (H) 09/26/2017   Lab Results  Component Value Date   MICROALBUR 304.2 (H) 11/11/2017   LDLCALC 78 05/02/2016   CREATININE 3.85 (H) 06/12/2018   No visits with results within 1 Week(s) from this visit.  Latest known visit with results is:  Lab on  06/12/2018  Component Date Value Ref Range Status  . Cholesterol 06/12/2018 152  0 - 200 mg/dL Final   ATP III Classification       Desirable:  < 200 mg/dL               Borderline High:  200 - 239 mg/dL          High:  > = 240 mg/dL  . Triglycerides 06/12/2018 222.0* 0.0 - 149.0 mg/dL Final   Normal:  <150 mg/dLBorderline High:  150 - 199 mg/dL  . HDL 06/12/2018 24.40* >39.00 mg/dL Final  . VLDL 06/12/2018 44.4* 0.0 - 40.0 mg/dL Final  . Total CHOL/HDL Ratio 06/12/2018 6   Final                  Men          Women1/2 Average Risk     3.4          3.3Average Risk          5.0          4.42X Average Risk          9.6          7.13X Average Risk          15.0          11.0                      . NonHDL 06/12/2018 127.13   Final   NOTE:  Non-HDL goal should be  30 mg/dL higher than patient's LDL goal (i.e. LDL goal of < 70 mg/dL, would have non-HDL goal of < 100 mg/dL)  . Glucose, Bld 06/12/2018 162* 70 - 99 mg/dL Final  . Sodium 06/12/2018 137  135 - 145 mEq/L Final  . Potassium 06/12/2018 4.2  3.5 - 5.1 mEq/L Final  . Chloride 06/12/2018 102  96 - 112 mEq/L Final  . CO2 06/12/2018 19  19 - 32 mEq/L Final  . Glucose, Bld 06/12/2018 162* 70 - 99 mg/dL Final  . BUN 06/12/2018 86* 6 - 23 mg/dL Final  . Creatinine, Ser 06/12/2018 3.85* 0.40 - 1.20 mg/dL Final  . Total Bilirubin 06/12/2018 0.3  0.2 - 1.2 mg/dL Final  . Alkaline Phosphatase 06/12/2018 235* 39 - 117 U/L Final  . AST 06/12/2018 32  0 - 37 U/L Final  . ALT 06/12/2018 45* 0 - 35 U/L Final  . Total Protein 06/12/2018 7.5  6.0 - 8.3 g/dL Final  . Albumin 06/12/2018 3.8  3.5 - 5.2 g/dL Final  . Calcium 06/12/2018 7.6* 8.4 - 10.5 mg/dL Final  . GFR 06/12/2018 12.57* >60.00 mL/min Final  . Hgb A1c MFr Bld 06/12/2018 10.3* 4.6 - 6.5 % Final   Glycemic Control Guidelines for People with Diabetes:Non Diabetic:  <6%Goal of Therapy: <7%Additional Action Suggested:  >8%   . Direct LDL 06/12/2018 82.0  mg/dL Final   Optimal:  <100 mg/dLNear or Above Optimal:  100-129 mg/dLBorderline High:  130-159 mg/dLHigh:  160-189 mg/dLVery High:  >190 mg/dL    Other problems addressed:: See review of systems   Allergies as of 06/25/2018      Reactions   Cleocin [clindamycin Hcl] Diarrhea   Lisinopril Other (See Comments)   Elevated potassium per pt report   Amoxicillin Diarrhea, Nausea Only, Other (See Comments)   Has patient had a PCN reaction causing immediate rash, facial/tongue/throat swelling, SOB or lightheadedness with hypotension: No Has patient had a  PCN reaction causing severe rash involving mucus membranes or skin necrosis: No Has patient had a PCN reaction that required hospitalization: No Has patient had a PCN reaction occurring within the last 10 years: No If all of the above answers are  "NO", then may proceed with Cephalosporin use.   Bactrim [sulfamethoxazole-trimethoprim] Diarrhea, Nausea Only      Medication List       Accurate as of June 25, 2018  3:23 PM. Always use your most recent med list.        albuterol 108 (90 Base) MCG/ACT inhaler Commonly known as:  PROVENTIL HFA;VENTOLIN HFA Inhale 2 puffs into the lungs every 6 (six) hours as needed for wheezing or shortness of breath. Reported on 04/15/2015   amLODipine 5 MG tablet Commonly known as:  NORVASC Take 1 tablet (5 mg total) by mouth daily.   aspirin 81 MG tablet Take 81 mg by mouth every evening.   BD Pen Needle Nano U/F 32G X 4 MM Misc Generic drug:  Insulin Pen Needle USE AS DIRECTED TWICE A DAY   calcitRIOL 0.5 MCG capsule Commonly known as:  ROCALTROL Take 1 capsule daily   Calcium Gummies 250-100-500 MG-MG-UNIT Chew Generic drug:  Calcium-Phosphorus-Vitamin D Chew 2 each by mouth every evening.   CINNAMON PO Take 1 tablet by mouth every evening.   folic acid 1 MG tablet Commonly known as:  FOLVITE Take 1 mg by mouth every evening.   FreeStyle Libre Reader Devi 1 Device by Does not apply route as directed.   FreeStyle [employer] Misc Use 1 sensor every 10 days   furosemide 40 MG tablet Commonly known as:  LASIX Take 40 mg by mouth daily as needed for fluid.   HumaLOG 100 UNIT/ML injection Generic drug:  insulin lispro CALL MD IF <70, IF 151-200 =2UNITS,201-250 =4 UNITS,251-300 =6 UNITS, 301-350 =8 UNITS,351-400 =10 U   ketorolac 0.5 % ophthalmic solution Commonly known as:  ACULAR PLACE 1 DROP IN RIGHT EYE FOUR TIMES A DAY. START 1 WEEK PRIOR TO SURGERY   loratadine 10 MG tablet Commonly known as:  CLARITIN Take 1 tablet (10 mg total) by mouth daily.   norethindrone 5 MG tablet Commonly known as:  AYGESTIN Take by mouth.   ondansetron 4 MG disintegrating tablet Commonly known as:  ZOFRAN-ODT DISSOLVE 1 TABLET ON TONGUE EVERY 8 HOURS AS NEEDED FOR NAUSEA    pravastatin 40 MG tablet Commonly known as:  PRAVACHOL TAKE 1 TABLET BY MOUTH EVERY DAY   pyridoxine 100 MG tablet Commonly known as:  B-6 Take 100 mg by mouth daily. Reported on 04/15/2015   V-R VITAMIN B-12 500 MCG tablet Generic drug:  vitamin B-12 Take by mouth.   vitamin C 500 MG tablet Commonly known as:  ASCORBIC ACID Take 500 mg by mouth daily.   Vitamin D (Ergocalciferol) 1.25 MG (50000 UT) Caps capsule Commonly known as:  DRISDOL TAKE ONE CAPSULE BY MOUTH ONE TIME PER WEEK       Allergies:  Allergies  Allergen Reactions  . Cleocin [Clindamycin Hcl] Diarrhea  . Lisinopril Other (See Comments)    Elevated potassium per pt report  . Amoxicillin Diarrhea, Nausea Only and Other (See Comments)    Has patient had a PCN reaction causing immediate rash, facial/tongue/throat swelling, SOB or lightheadedness with hypotension: No Has patient had a PCN reaction causing severe rash involving mucus membranes or skin necrosis: No Has patient had a PCN reaction that required hospitalization: No Has patient had  a PCN reaction occurring within the last 10 years: No If all of the above answers are "NO", then may proceed with Cephalosporin use.   . Bactrim [Sulfamethoxazole-Trimethoprim] Diarrhea and Nausea Only    Past Medical History:  Diagnosis Date  . Anemia   . Arthritis   . Bronchitis   . C. difficile diarrhea 09/26/2014  . Cataracts, both eyes   . Cellulitis of right foot 06/02/2014  . CKD (chronic kidney disease) stage 3, GFR 30-59 ml/min (HCC)    sees Dr Justin Mend- Stage IV  . Diabetic ulcer of right foot ([place]) 06/02/2014  . DVT (deep venous thrombosis) ([place]) 10/2014   one in each one in leg- PICC line , one in right and left arm  . H/O seasonal allergies   . Heart murmur    told once when she was pregnant- no furter mention- was in notes 11/2015- had Echo 8 /4/17  . History of blood transfusion   . Hypercholesteremia   . Hypertension   . Left foot infection   .  Neuropathy    feet  . Neuropathy in diabetes ([place])   . Peripheral vascular disease ([place])   . Sleep apnea    not able to use CPAP  . Staphylococcus aureus bacteremia 10/2014  . Type 1 diabetes ([place])    onset age 20    Past Surgical History:  Procedure Laterality Date  . AMPUTATION TOE Left 07/24/2015   5th toe and Hallux amputation  . CESAREAN SECTION    . EXOSTECTECTOMY TOE Left 04/11/2016   Procedure: EXOSTECTECTOMY CUBOID AND 5TH METATARSAL AND PLACEMENT OF ANTIBIOTIC BEADS;  Surgeon: Edrick Kins, DPM;  Location: [facility];  Service: Podiatry;  Laterality: Left;  . EYE SURGERY Bilateral    [place]  . FEMORAL-POPLITEAL BYPASS GRAFT Left 05/03/2015  . FOOT AMPUTATION THROUGH METATARSAL Right 2016  . hemrrhoidectomy    . I&D EXTREMITY Right 06/10/2014   Procedure: IRRIGATION AND DEBRIDEMENT Right Foot;  Surgeon: Newt Minion, MD;  Location: [facility];  Service: Orthopedics;  Laterality: Right;  . INCISION AND DRAINAGE Left 04/11/2016   Procedure: INCISION AND DRAINAGE A DEEP COMPLICATED WOUND OF LEFT FOOT;  Surgeon: Edrick Kins, DPM;  Location: [facility];  Service: Podiatry;  Laterality: Left;  . IR FLUORO GUIDE CV LINE RIGHT  09/24/2017  . IR REMOVAL TUN CV CATH W/O FL  11/22/2017  . IR US GUIDE VASC ACCESS RIGHT  09/24/2017  . IRRIGATION AND DEBRIDEMENT ABSCESS Left 05/06/2017   Procedure: IRRIGATION AND DEBRIDEMENT ABSCESS LEFT FOOT;  Surgeon: Edrick Kins, DPM;  Location: [facility];  Service: Podiatry;  Laterality: Left;  . PERIPHERALLY INSERTED CENTRAL CATHETER INSERTION    . SKIN GRAFT Right 06/15/2014  . SKIN GRAFT Left 06/2015   foot   . SKIN SPLIT GRAFT Right 06/15/2014   Procedure: SPLIT THICKNESS SKIN GRAFT RIGHT FOOT;  Surgeon: Newt Minion, MD;  Location: [facility];  Service: Orthopedics;  Laterality: Right;  . TRANSMETATARSAL AMPUTATION Right   . TRANSMETATARSAL AMPUTATION Right 09/11/2014    Family History  Problem Relation Age of Onset  . Hyperlipidemia Mother   . Dementia Mother      Social History:  reports that she quit smoking about 10 years ago. She quit after 15.00 years of use. She has never used smokeless tobacco. She reports current alcohol use. She reports that she does not use drugs.  Review of Systems:   Hypertension/CKD:     Hypertension  Treated with  amlodipine 5 mg, followed by PCP  BP Readings from Last 3 Encounters:  06/25/18 140/70  03/21/18 134/78  01/15/18 124/82   She tends to have high potassium at times  Lab Results  Component Value Date   K 4.2 06/12/2018   Renal function is consistently significantly high and relatively worse again Also not regular with her follow-up with nephrologist, last seen in 10/19 apparently   Lab Results  Component Value Date   CREATININE 3.85 (H) 06/12/2018   CREATININE 3.47 (H) 01/13/2018   CREATININE 3.87 (H) 11/11/2017    She is taking calcitriol from nephrologist  Also has vitamin D deficiency   Lab Results  Component Value Date   CALCIUM 7.6 (L) 06/12/2018   PHOS 3.5 08/18/2014     Lipids: Has been  treated with pravastatin Triglycerides are high on the last measurement LDL as follows   Lab Results  Component Value Date   CHOL 152 06/12/2018   HDL 24.40 (L) 06/12/2018   LDLCALC 78 05/02/2016   LDLDIRECT 82.0 06/12/2018   TRIG 222.0 (H) 06/12/2018   CHOLHDL 6 06/12/2018      Chronic anemia, followed by nephrologist and PCP:  Lab Results  Component Value Date   WBC 4.6 11/11/2017   HGB 10.8 (L) 11/26/2017   HCT 29.0 (L) 11/11/2017   MCV 86.6 11/11/2017   PLT 360 11/11/2017    She has a Charcot foot, has had amputation of Left fifth toe       Examination:   BP 140/70 (BP Location: Left Arm, Patient Position: Sitting, Cuff Size: Normal)   Pulse 90   Temp 99 F (37.2 C) (Oral)   Ht 5\' 2"  (1.575 m)   Wt 131 lb 6.4 oz (59.6 kg)   SpO2 99%   BMI 24.03 kg/m   Body mass index is 24.03 kg/m.      ASSESSMENT/ PLAN:   Diabetes type 1 with poor control and  multiple complications    Her diabetes is persistently poorly controlled related to inadequate insulin most of the time  See history of present illness for detailed discussion of current diabetes management, blood sugar patterns and problems identified  A1c is still over 10%  She is still having marked hyperglycemia with random episodes of hypoglycemia also Most of her problems are related to getting hypoglycemic episodes which are severe and the patient is being afraid of keeping her blood sugar under better control She does not do any boluses when she needs to and also rarely covering her meals  Also since she is not able to get any family member or friend to help her deal with the severe hypoglycemia difficult to treat her adequately  From history most of her low sugars tend to be in the late afternoon when her basal rate is 0.8 at the highest level Also did have one low sugar during the night and not clear why Apparently not able to get a new insulin pump because of cost Also not able to see her CGM recording for the last 2 weeks since she ran out of her sensor which fell off  Also since she is suspending her pump for several hours frequently he tends to have periodically blood sugars above 400 and causing some insulin resistance with this also  Still not able to program her pump herself  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   Counseling  time on subjects discussed in assessment and plan sections is over 50% of today's 25 minute visit   Elayne Snare 06/25/2018, 3:23 PM   ADDENDUM:  Patient is not suspending her pump and only using temporary basal as instructed by nurse educator.  This was confirmed by telephone call by nurse educator today  Elayne Snare 07/02/18

## 2018-06-26 ENCOUNTER — Other Ambulatory Visit: Payer: Self-pay | Admitting: Endocrinology

## 2018-06-26 ENCOUNTER — Other Ambulatory Visit: Payer: Self-pay

## 2018-06-26 DIAGNOSIS — E1065 Type 1 diabetes mellitus with hyperglycemia: Secondary | ICD-10-CM

## 2018-06-26 NOTE — Patient Instructions (Signed)
To put the pump in temp basal rate:  Go to basal, set temp rate, time: 2 hours, rate: decrease to 0, start

## 2018-06-26 NOTE — Progress Notes (Addendum)
Patient was again shown how to make changes to her basal rates.  She made the changes with instruction from me.  New settings changes made:  Basal rate: MN: 0.4, 11:30-3PM: 0.4, 3PM-10PM: 0.6, 10PM-MN 0.75,   ISF: 8AM-MN: 70 We discussed why she should just put her pump in a temp basal rate instead of shutting off pump all night.  She reported good understanding of this.  She was shown how to do this and she re demonstrated the procedure herself.  She will put the pump in a temp basal rate for 2 hours of 0%.  She reported that she can do this, and will do this when her blood sugar drops low.  Strongly encouraged her to do this.  She reported good understanding of the need for this.

## 2018-06-30 ENCOUNTER — Telehealth: Payer: Self-pay | Admitting: Endocrinology

## 2018-06-30 NOTE — Telephone Encounter (Signed)
Patient stated she tried to get her testing supplies and the insurance denied her having these. She stated that Dr Dwyane Dee let them know she is not being compliant and is cutting her pump off .  Patient is requesting a call back to find out what she needs to do

## 2018-07-01 ENCOUNTER — Other Ambulatory Visit (HOSPITAL_COMMUNITY)
Admission: RE | Admit: 2018-07-01 | Discharge: 2018-07-01 | Disposition: A | Discharge status: Home / Self Care | Payer: Medicare Other | Source: Other Acute Inpatient Hospital | Attending: Internal Medicine | Admitting: Internal Medicine

## 2018-07-01 DIAGNOSIS — E10622 Type 1 diabetes mellitus with other skin ulcer: Secondary | ICD-10-CM | POA: Diagnosis not present

## 2018-07-01 DIAGNOSIS — E10621 Type 1 diabetes mellitus with foot ulcer: Secondary | ICD-10-CM | POA: Diagnosis present

## 2018-07-01 NOTE — Telephone Encounter (Signed)
Have attempted to call [facility] with no answers at all either times.

## 2018-07-03 ENCOUNTER — Ambulatory Visit: Payer: Medicare Other | Admitting: Obstetrics and Gynecology

## 2018-07-05 LAB — AEROBIC/ANAEROBIC CULTURE W GRAM STAIN (SURGICAL/DEEP WOUND): Gram Stain: NONE SEEN

## 2018-07-05 LAB — AEROBIC/ANAEROBIC CULTURE (SURGICAL/DEEP WOUND)

## 2018-07-08 ENCOUNTER — Ambulatory Visit: Payer: Medicare Other | Admitting: Endocrinology

## 2018-07-12 ENCOUNTER — Other Ambulatory Visit: Payer: Self-pay | Admitting: Endocrinology

## 2018-07-16 ENCOUNTER — Ambulatory Visit: Payer: Medicare Other | Admitting: Obstetrics and Gynecology

## 2018-07-16 NOTE — Telephone Encounter (Signed)
A rep from [facility] healthcare called and stated that they have not received the updated chart notes. Rep's name was Helene Kelp.  Helene Kelp was asked to verify the date of the last fax that was received. I was placed on hold for approx 4-5 minutes, at which time Helene Kelp stated that her computer would not allow her to access the faxes at this time. Helene Kelp was then informed that we do have faxed on 07/04/2018 that were sent to them and fax confirmations were received and are being stored with the faxes that were sent.  Helene Kelp was informed that an addendum was made to the chart note to meet Medicare guidelines, and my concern was that whomever received the fax on the third did not read all of the note again and notice the addendum was made on page 10.  This fax was sent again with attention set to [facility] and faxed to her personal fax number of [phone].

## 2018-07-16 NOTE — Telephone Encounter (Signed)
[facility] [employer] is still stating that we have not sent the info, I see that we have and we cannot get in touch with them either. Told pt that at this point we can keep faxing and calling them but until they answer or give [place] an alternate fax this will not work.   Pt is calling them back to see if she can get an alternate fax for [place]

## 2018-07-18 ENCOUNTER — Encounter (HOSPITAL_BASED_OUTPATIENT_CLINIC_OR_DEPARTMENT_OTHER): Payer: Medicare Other | Attending: Internal Medicine

## 2018-07-18 ENCOUNTER — Ambulatory Visit (HOSPITAL_BASED_OUTPATIENT_CLINIC_OR_DEPARTMENT_OTHER): Payer: Medicare Other

## 2018-07-18 DIAGNOSIS — M79661 Pain in right lower leg: Secondary | ICD-10-CM | POA: Insufficient documentation

## 2018-07-18 DIAGNOSIS — G4730 Sleep apnea, unspecified: Secondary | ICD-10-CM | POA: Diagnosis not present

## 2018-07-18 DIAGNOSIS — E10621 Type 1 diabetes mellitus with foot ulcer: Secondary | ICD-10-CM | POA: Insufficient documentation

## 2018-07-18 DIAGNOSIS — L97422 Non-pressure chronic ulcer of left heel and midfoot with fat layer exposed: Secondary | ICD-10-CM | POA: Insufficient documentation

## 2018-07-18 DIAGNOSIS — E1040 Type 1 diabetes mellitus with diabetic neuropathy, unspecified: Secondary | ICD-10-CM | POA: Diagnosis not present

## 2018-07-18 DIAGNOSIS — Z86718 Personal history of other venous thrombosis and embolism: Secondary | ICD-10-CM | POA: Insufficient documentation

## 2018-07-18 DIAGNOSIS — I80239 Phlebitis and thrombophlebitis of unspecified tibial vein: Secondary | ICD-10-CM | POA: Insufficient documentation

## 2018-07-18 DIAGNOSIS — I10 Essential (primary) hypertension: Secondary | ICD-10-CM | POA: Diagnosis not present

## 2018-07-22 DIAGNOSIS — E10621 Type 1 diabetes mellitus with foot ulcer: Secondary | ICD-10-CM | POA: Diagnosis not present

## 2018-07-23 ENCOUNTER — Other Ambulatory Visit: Payer: Self-pay

## 2018-07-23 ENCOUNTER — Ambulatory Visit (HOSPITAL_COMMUNITY)
Admission: RE | Admit: 2018-07-23 | Discharge: 2018-07-23 | Disposition: A | Discharge status: Home / Self Care | Payer: Medicare Other | Source: Ambulatory Visit | Attending: Family | Admitting: Family

## 2018-07-23 ENCOUNTER — Other Ambulatory Visit (HOSPITAL_COMMUNITY): Payer: Self-pay | Admitting: Internal Medicine

## 2018-07-23 DIAGNOSIS — L97919 Non-pressure chronic ulcer of unspecified part of right lower leg with unspecified severity: Secondary | ICD-10-CM

## 2018-07-24 ENCOUNTER — Telehealth: Payer: Self-pay

## 2018-07-24 ENCOUNTER — Telehealth: Payer: Self-pay | Admitting: Endocrinology

## 2018-07-24 NOTE — Telephone Encounter (Signed)
Called [facility] [employer] to see why addendum in chart notes were not accepted and they stated that the addendum was made more that 48 hours after the visit date, which is not in line with Medicare guidelines. Pt would have to be seen for another visit with all pertinent info documented in the new note. Note should include  -using pump and/or CGM -checking blood sugar 4 or more times daily. -that she uses pump continuously and does not turn off at night.  Would you like to schedule her for another visit?

## 2018-07-24 NOTE — Telephone Encounter (Signed)
Patient has called stating that [employer] reached out to her stating that the paperwork the received from [place] on 07/16/18 is incorrect and will be refaxing [place] today. Patient states they only have 6 days of supplies left.  Please Advise, Thanks

## 2018-07-24 NOTE — Telephone Encounter (Signed)
She is already scheduled for 5/6 which is adequate.

## 2018-07-29 ENCOUNTER — Emergency Department (HOSPITAL_COMMUNITY): Payer: Medicare Other

## 2018-07-29 ENCOUNTER — Other Ambulatory Visit: Payer: Self-pay

## 2018-07-29 ENCOUNTER — Encounter (HOSPITAL_COMMUNITY): Payer: Self-pay

## 2018-07-29 ENCOUNTER — Inpatient Hospital Stay (HOSPITAL_COMMUNITY)
Admission: EM | Admit: 2018-07-29 | Discharge: 2018-08-09 | DRG: 853 | Disposition: A | Discharge status: Home Health | Payer: Medicare Other | Attending: Family Medicine | Admitting: Family Medicine

## 2018-07-29 DIAGNOSIS — Z8619 Personal history of other infectious and parasitic diseases: Secondary | ICD-10-CM

## 2018-07-29 DIAGNOSIS — Z992 Dependence on renal dialysis: Secondary | ICD-10-CM

## 2018-07-29 DIAGNOSIS — N186 End stage renal disease: Secondary | ICD-10-CM

## 2018-07-29 DIAGNOSIS — E785 Hyperlipidemia, unspecified: Secondary | ICD-10-CM | POA: Diagnosis present

## 2018-07-29 DIAGNOSIS — L97509 Non-pressure chronic ulcer of other part of unspecified foot with unspecified severity: Secondary | ICD-10-CM | POA: Diagnosis not present

## 2018-07-29 DIAGNOSIS — L97519 Non-pressure chronic ulcer of other part of right foot with unspecified severity: Secondary | ICD-10-CM | POA: Diagnosis not present

## 2018-07-29 DIAGNOSIS — E1049 Type 1 diabetes mellitus with other diabetic neurological complication: Secondary | ICD-10-CM | POA: Diagnosis not present

## 2018-07-29 DIAGNOSIS — B9561 Methicillin susceptible Staphylococcus aureus infection as the cause of diseases classified elsewhere: Secondary | ICD-10-CM | POA: Diagnosis not present

## 2018-07-29 DIAGNOSIS — N185 Chronic kidney disease, stage 5: Secondary | ICD-10-CM | POA: Diagnosis not present

## 2018-07-29 DIAGNOSIS — M869 Osteomyelitis, unspecified: Secondary | ICD-10-CM

## 2018-07-29 DIAGNOSIS — Z794 Long term (current) use of insulin: Secondary | ICD-10-CM | POA: Diagnosis not present

## 2018-07-29 DIAGNOSIS — R34 Anuria and oliguria: Secondary | ICD-10-CM | POA: Diagnosis present

## 2018-07-29 DIAGNOSIS — E1065 Type 1 diabetes mellitus with hyperglycemia: Secondary | ICD-10-CM | POA: Diagnosis present

## 2018-07-29 DIAGNOSIS — A4102 Sepsis due to Methicillin resistant Staphylococcus aureus: Secondary | ICD-10-CM | POA: Diagnosis present

## 2018-07-29 DIAGNOSIS — N179 Acute kidney failure, unspecified: Secondary | ICD-10-CM | POA: Diagnosis present

## 2018-07-29 DIAGNOSIS — R011 Cardiac murmur, unspecified: Secondary | ICD-10-CM | POA: Diagnosis not present

## 2018-07-29 DIAGNOSIS — E872 Acidosis: Secondary | ICD-10-CM

## 2018-07-29 DIAGNOSIS — E1022 Type 1 diabetes mellitus with diabetic chronic kidney disease: Secondary | ICD-10-CM | POA: Diagnosis present

## 2018-07-29 DIAGNOSIS — Z7982 Long term (current) use of aspirin: Secondary | ICD-10-CM | POA: Diagnosis not present

## 2018-07-29 DIAGNOSIS — L03115 Cellulitis of right lower limb: Secondary | ICD-10-CM | POA: Diagnosis present

## 2018-07-29 DIAGNOSIS — I120 Hypertensive chronic kidney disease with stage 5 chronic kidney disease or end stage renal disease: Secondary | ICD-10-CM | POA: Diagnosis present

## 2018-07-29 DIAGNOSIS — D631 Anemia in chronic kidney disease: Secondary | ICD-10-CM

## 2018-07-29 DIAGNOSIS — Z881 Allergy status to other antibiotic agents status: Secondary | ICD-10-CM

## 2018-07-29 DIAGNOSIS — E10319 Type 1 diabetes mellitus with unspecified diabetic retinopathy without macular edema: Secondary | ICD-10-CM | POA: Diagnosis present

## 2018-07-29 DIAGNOSIS — I10 Essential (primary) hypertension: Secondary | ICD-10-CM | POA: Diagnosis not present

## 2018-07-29 DIAGNOSIS — Z8614 Personal history of Methicillin resistant Staphylococcus aureus infection: Secondary | ICD-10-CM

## 2018-07-29 DIAGNOSIS — Z89419 Acquired absence of unspecified great toe: Secondary | ICD-10-CM | POA: Diagnosis not present

## 2018-07-29 DIAGNOSIS — N2581 Secondary hyperparathyroidism of renal origin: Secondary | ICD-10-CM | POA: Diagnosis present

## 2018-07-29 DIAGNOSIS — M86671 Other chronic osteomyelitis, right ankle and foot: Secondary | ICD-10-CM | POA: Diagnosis present

## 2018-07-29 DIAGNOSIS — R6520 Severe sepsis without septic shock: Secondary | ICD-10-CM | POA: Diagnosis not present

## 2018-07-29 DIAGNOSIS — H547 Unspecified visual loss: Secondary | ICD-10-CM | POA: Diagnosis present

## 2018-07-29 DIAGNOSIS — Z978 Presence of other specified devices: Secondary | ICD-10-CM | POA: Diagnosis not present

## 2018-07-29 DIAGNOSIS — N184 Chronic kidney disease, stage 4 (severe): Secondary | ICD-10-CM | POA: Diagnosis not present

## 2018-07-29 DIAGNOSIS — Z883 Allergy status to other anti-infective agents status: Secondary | ICD-10-CM

## 2018-07-29 DIAGNOSIS — E1051 Type 1 diabetes mellitus with diabetic peripheral angiopathy without gangrene: Secondary | ICD-10-CM | POA: Diagnosis present

## 2018-07-29 DIAGNOSIS — Z95828 Presence of other vascular implants and grafts: Secondary | ICD-10-CM

## 2018-07-29 DIAGNOSIS — E10628 Type 1 diabetes mellitus with other skin complications: Secondary | ICD-10-CM | POA: Diagnosis not present

## 2018-07-29 DIAGNOSIS — F419 Anxiety disorder, unspecified: Secondary | ICD-10-CM | POA: Diagnosis present

## 2018-07-29 DIAGNOSIS — A419 Sepsis, unspecified organism: Secondary | ICD-10-CM | POA: Diagnosis not present

## 2018-07-29 DIAGNOSIS — Z20828 Contact with and (suspected) exposure to other viral communicable diseases: Secondary | ICD-10-CM | POA: Diagnosis present

## 2018-07-29 DIAGNOSIS — Z89421 Acquired absence of other right toe(s): Secondary | ICD-10-CM

## 2018-07-29 DIAGNOSIS — Z89412 Acquired absence of left great toe: Secondary | ICD-10-CM | POA: Diagnosis not present

## 2018-07-29 DIAGNOSIS — E10649 Type 1 diabetes mellitus with hypoglycemia without coma: Secondary | ICD-10-CM | POA: Diagnosis not present

## 2018-07-29 DIAGNOSIS — E1040 Type 1 diabetes mellitus with diabetic neuropathy, unspecified: Secondary | ICD-10-CM | POA: Diagnosis present

## 2018-07-29 DIAGNOSIS — L02415 Cutaneous abscess of right lower limb: Secondary | ICD-10-CM | POA: Diagnosis present

## 2018-07-29 DIAGNOSIS — G4730 Sleep apnea, unspecified: Secondary | ICD-10-CM | POA: Diagnosis present

## 2018-07-29 DIAGNOSIS — Z8349 Family history of other endocrine, nutritional and metabolic diseases: Secondary | ICD-10-CM

## 2018-07-29 DIAGNOSIS — E1069 Type 1 diabetes mellitus with other specified complication: Secondary | ICD-10-CM | POA: Diagnosis present

## 2018-07-29 DIAGNOSIS — M86171 Other acute osteomyelitis, right ankle and foot: Secondary | ICD-10-CM

## 2018-07-29 DIAGNOSIS — Z79899 Other long term (current) drug therapy: Secondary | ICD-10-CM

## 2018-07-29 DIAGNOSIS — Z9641 Presence of insulin pump (external) (internal): Secondary | ICD-10-CM | POA: Diagnosis present

## 2018-07-29 DIAGNOSIS — B9562 Methicillin resistant Staphylococcus aureus infection as the cause of diseases classified elsewhere: Secondary | ICD-10-CM | POA: Diagnosis not present

## 2018-07-29 DIAGNOSIS — J302 Other seasonal allergic rhinitis: Secondary | ICD-10-CM | POA: Diagnosis present

## 2018-07-29 DIAGNOSIS — E10621 Type 1 diabetes mellitus with foot ulcer: Secondary | ICD-10-CM | POA: Diagnosis not present

## 2018-07-29 DIAGNOSIS — Z86718 Personal history of other venous thrombosis and embolism: Secondary | ICD-10-CM | POA: Diagnosis not present

## 2018-07-29 DIAGNOSIS — M65871 Other synovitis and tenosynovitis, right ankle and foot: Secondary | ICD-10-CM | POA: Diagnosis present

## 2018-07-29 DIAGNOSIS — R7881 Bacteremia: Secondary | ICD-10-CM | POA: Diagnosis not present

## 2018-07-29 DIAGNOSIS — E1021 Type 1 diabetes mellitus with diabetic nephropathy: Secondary | ICD-10-CM | POA: Diagnosis not present

## 2018-07-29 DIAGNOSIS — M86071 Acute hematogenous osteomyelitis, right ankle and foot: Secondary | ICD-10-CM

## 2018-07-29 DIAGNOSIS — Z8631 Personal history of diabetic foot ulcer: Secondary | ICD-10-CM | POA: Diagnosis not present

## 2018-07-29 DIAGNOSIS — L089 Local infection of the skin and subcutaneous tissue, unspecified: Secondary | ICD-10-CM | POA: Diagnosis not present

## 2018-07-29 DIAGNOSIS — D638 Anemia in other chronic diseases classified elsewhere: Secondary | ICD-10-CM | POA: Diagnosis present

## 2018-07-29 DIAGNOSIS — N189 Chronic kidney disease, unspecified: Secondary | ICD-10-CM

## 2018-07-29 DIAGNOSIS — IMO0002 Reserved for concepts with insufficient information to code with codable children: Secondary | ICD-10-CM | POA: Diagnosis present

## 2018-07-29 DIAGNOSIS — Z888 Allergy status to other drugs, medicaments and biological substances status: Secondary | ICD-10-CM

## 2018-07-29 DIAGNOSIS — Z89429 Acquired absence of other toe(s), unspecified side: Secondary | ICD-10-CM | POA: Diagnosis not present

## 2018-07-29 DIAGNOSIS — E1029 Type 1 diabetes mellitus with other diabetic kidney complication: Secondary | ICD-10-CM | POA: Diagnosis present

## 2018-07-29 DIAGNOSIS — E878 Other disorders of electrolyte and fluid balance, not elsewhere classified: Secondary | ICD-10-CM | POA: Diagnosis present

## 2018-07-29 DIAGNOSIS — Z87891 Personal history of nicotine dependence: Secondary | ICD-10-CM

## 2018-07-29 LAB — URINALYSIS, ROUTINE W REFLEX MICROSCOPIC
Bilirubin Urine: NEGATIVE
Glucose, UA: >=500 mg/dL — AB
Ketones, ur: NEGATIVE mg/dL
Nitrite: POSITIVE — AB
Protein, ur: >=300 mg/dL — AB
Specific Gravity, Urine: 1.01 (ref 1.005–1.030)
pH: 5 (ref 5.0–8.0)

## 2018-07-29 LAB — COMPREHENSIVE METABOLIC PANEL
ALT: 18 U/L (ref 0–44)
AST: 16 U/L (ref 15–41)
Albumin: 2.1 g/dL — ABNORMAL LOW (ref 3.5–5.0)
Alkaline Phosphatase: 249 U/L — ABNORMAL HIGH (ref 38–126)
Anion gap: 14 (ref 5–15)
BUN: 101 mg/dL — ABNORMAL HIGH (ref 6–20)
CO2: <7 mmol/L — ABNORMAL LOW (ref 22–32)
Calcium: 6.2 mg/dL — CL (ref 8.9–10.3)
Chloride: 113 mmol/L — ABNORMAL HIGH (ref 98–111)
Creatinine, Ser: 7 mg/dL — ABNORMAL HIGH (ref 0.44–1.00)
GFR calc Af Amer: 7 mL/min — ABNORMAL LOW (ref >60)
GFR calc non Af Amer: 6 mL/min — ABNORMAL LOW (ref >60)
Glucose, Bld: 61 mg/dL — ABNORMAL LOW (ref 70–99)
Potassium: 4.5 mmol/L (ref 3.5–5.1)
Sodium: 133 mmol/L — ABNORMAL LOW (ref 135–145)
Total Bilirubin: 0.4 mg/dL (ref 0.3–1.2)
Total Protein: 6.3 g/dL — ABNORMAL LOW (ref 6.5–8.1)

## 2018-07-29 LAB — SARS CORONAVIRUS 2 BY RT PCR (HOSPITAL ORDER, PERFORMED IN ~~LOC~~ HOSPITAL LAB): SARS Coronavirus 2: NEGATIVE

## 2018-07-29 LAB — CBC WITH DIFFERENTIAL/PLATELET
Abs Immature Granulocytes: 0.21 10*3/uL — ABNORMAL HIGH (ref 0.00–0.07)
Basophils Absolute: 0 10*3/uL (ref 0.0–0.1)
Basophils Relative: 0 %
Eosinophils Absolute: 0 10*3/uL (ref 0.0–0.5)
Eosinophils Relative: 0 %
HCT: 26.8 % — ABNORMAL LOW (ref 36.0–46.0)
Hemoglobin: 8.5 g/dL — ABNORMAL LOW (ref 12.0–15.0)
Immature Granulocytes: 1 %
Lymphocytes Relative: 2 %
Lymphs Abs: 0.5 10*3/uL — ABNORMAL LOW (ref 0.7–4.0)
MCH: 28 pg (ref 26.0–34.0)
MCHC: 31.7 g/dL (ref 30.0–36.0)
MCV: 88.2 fL (ref 80.0–100.0)
Monocytes Absolute: 1.1 10*3/uL — ABNORMAL HIGH (ref 0.1–1.0)
Monocytes Relative: 5 %
Neutro Abs: 22.8 10*3/uL — ABNORMAL HIGH (ref 1.7–7.7)
Neutrophils Relative %: 92 %
Platelets: 528 10*3/uL — ABNORMAL HIGH (ref 150–400)
RBC: 3.04 MIL/uL — ABNORMAL LOW (ref 3.87–5.11)
RDW: 14.8 % (ref 11.5–15.5)
WBC: 24.7 10*3/uL — ABNORMAL HIGH (ref 4.0–10.5)
nRBC: 0.1 % (ref 0.0–0.2)

## 2018-07-29 LAB — SEDIMENTATION RATE: Sed Rate: 107 mm/hr — ABNORMAL HIGH (ref 0–22)

## 2018-07-29 LAB — C-REACTIVE PROTEIN: CRP: 29 mg/dL — ABNORMAL HIGH (ref <1.0)

## 2018-07-29 LAB — LACTIC ACID, PLASMA: Lactic Acid, Venous: 1.6 mmol/L (ref 0.5–1.9)

## 2018-07-29 MED ORDER — VANCOMYCIN HCL IN DEXTROSE 1-5 GM/200ML-% IV SOLN
1000.0000 mg | Freq: Once | INTRAVENOUS | Status: AC
  Administered 2018-07-29: 1000 mg via INTRAVENOUS
  Filled 2018-07-29: qty 200

## 2018-07-29 MED ORDER — ACETAMINOPHEN 650 MG RE SUPP: 650.0000 mg | Freq: Four times a day (QID) | RECTAL | Status: DC | PRN

## 2018-07-29 MED ORDER — DOCUSATE SODIUM 100 MG PO CAPS
100.0000 mg | ORAL_CAPSULE | Freq: Two times a day (BID) | ORAL | Status: DC
  Filled 2018-07-29 (×9): qty 1

## 2018-07-29 MED ORDER — MORPHINE SULFATE (PF) 4 MG/ML IV SOLN
4.0000 mg | Freq: Once | INTRAVENOUS | Status: DC
  Filled 2018-07-29: qty 1

## 2018-07-29 MED ORDER — VANCOMYCIN VARIABLE DOSE PER UNSTABLE RENAL FUNCTION (PHARMACIST DOSING): Status: DC

## 2018-07-29 MED ORDER — SODIUM BICARBONATE 8.4 % IV SOLN
INTRAVENOUS | Status: AC
  Administered 2018-07-30 (×2): via INTRAVENOUS
  Filled 2018-07-29 (×2): qty 150

## 2018-07-29 MED ORDER — SODIUM CHLORIDE 0.9 % IV SOLN
INTRAVENOUS | Status: DC
  Administered 2018-07-29: 18:00:00 via INTRAVENOUS

## 2018-07-29 MED ORDER — PRAVASTATIN SODIUM 40 MG PO TABS
40.0000 mg | ORAL_TABLET | Freq: Every day | ORAL | Status: DC
  Administered 2018-07-30 – 2018-08-01 (×3): 40 mg via ORAL
  Filled 2018-07-29 (×3): qty 1

## 2018-07-29 MED ORDER — ACETAMINOPHEN 325 MG PO TABS
650.0000 mg | ORAL_TABLET | Freq: Four times a day (QID) | ORAL | Status: DC | PRN
  Administered 2018-08-06: 650 mg via ORAL
  Filled 2018-07-29: qty 2

## 2018-07-29 MED ORDER — GUAIFENESIN-CODEINE 100-10 MG/5ML PO SOLN
10.0000 mL | Freq: Once | ORAL | Status: AC
  Administered 2018-07-29: 16:00:00 10 mL via ORAL
  Filled 2018-07-29: qty 10

## 2018-07-29 MED ORDER — INSULIN ASPART 100 UNIT/ML ~~LOC~~ SOLN
0.0000 [IU] | SUBCUTANEOUS | Status: DC
  Administered 2018-07-30: 1.1 [IU] via SUBCUTANEOUS

## 2018-07-29 MED ORDER — AMLODIPINE BESYLATE 5 MG PO TABS
5.0000 mg | ORAL_TABLET | Freq: Every day | ORAL | Status: DC
  Administered 2018-07-30 – 2018-08-08 (×7): 5 mg via ORAL
  Filled 2018-07-29 (×10): qty 1

## 2018-07-29 MED ORDER — OXYCODONE HCL 5 MG PO TABS
5.0000 mg | ORAL_TABLET | Freq: Four times a day (QID) | ORAL | Status: DC | PRN
  Administered 2018-07-30 – 2018-07-31 (×3): 5 mg via ORAL
  Filled 2018-07-29 (×3): qty 1

## 2018-07-29 MED ORDER — DEXTROSE 5 % IV SOLN
500.0000 mg | INTRAVENOUS | Status: DC
  Filled 2018-07-29: qty 0.5

## 2018-07-29 MED ORDER — SODIUM CHLORIDE 0.9% FLUSH
3.0000 mL | Freq: Two times a day (BID) | INTRAVENOUS | Status: DC
  Administered 2018-07-30 – 2018-08-04 (×7): 3 mL via INTRAVENOUS

## 2018-07-29 MED ORDER — LORATADINE 10 MG PO TABS
10.0000 mg | ORAL_TABLET | Freq: Every day | ORAL | Status: DC
  Filled 2018-07-29 (×8): qty 1

## 2018-07-29 MED ORDER — HEPARIN SODIUM (PORCINE) 5000 UNIT/ML IJ SOLN
5000.0000 [IU] | Freq: Three times a day (TID) | INTRAMUSCULAR | Status: DC
  Administered 2018-07-30 – 2018-08-08 (×23): 5000 [IU] via SUBCUTANEOUS
  Filled 2018-07-29 (×25): qty 1

## 2018-07-29 MED ORDER — SODIUM CHLORIDE 0.9 % IV SOLN
2.0000 g | Freq: Once | INTRAVENOUS | Status: AC
  Administered 2018-07-29: 18:00:00 2 g via INTRAVENOUS
  Filled 2018-07-29: qty 2

## 2018-07-29 MED ORDER — BENZONATATE 100 MG PO CAPS
100.0000 mg | ORAL_CAPSULE | Freq: Three times a day (TID) | ORAL | Status: DC | PRN
  Administered 2018-07-30 (×2): 100 mg via ORAL
  Filled 2018-07-29 (×2): qty 1

## 2018-07-29 MED ORDER — SENNA 8.6 MG PO TABS
1.0000 | ORAL_TABLET | Freq: Two times a day (BID) | ORAL | Status: DC
  Filled 2018-07-29 (×9): qty 1

## 2018-07-29 MED ORDER — ALBUTEROL SULFATE (2.5 MG/3ML) 0.083% IN NEBU
3.0000 mL | INHALATION_SOLUTION | Freq: Four times a day (QID) | RESPIRATORY_TRACT | Status: DC | PRN
  Filled 2018-07-29: qty 3

## 2018-07-29 MED ORDER — ACETAMINOPHEN 325 MG PO TABS
650.0000 mg | ORAL_TABLET | Freq: Once | ORAL | Status: AC
  Administered 2018-07-29: 650 mg via ORAL
  Filled 2018-07-29: qty 2

## 2018-07-29 NOTE — ED Provider Notes (Signed)
Patient signed out to me by Dr. Wilson Singer pending her MRI of her right foot.  Those results were consistent with osteomyelitis with some pus.  He had ordered antibiotics prior to leaving.  Case discussed with Dr. Alma Friendly from orthopedics who wants patient to go to [facility].  Patient also has evidence of renal failure and this was communicated to the hospitalist who will admit the patient   Lacretia Leigh, MD 07/29/18 2148

## 2018-07-29 NOTE — ED Notes (Signed)
Pt transported to MRI 

## 2018-07-29 NOTE — ED Notes (Signed)
Urology at bedside. Pt in NAD

## 2018-07-29 NOTE — ED Notes (Signed)
Date and time results received: 07/29/18 2125 (use smartphrase ".now" to insert current time)  Test: Calcium Critical Value: 6.2  Name of Provider Notified: Dr. Zenia Resides  Orders Received? Or Actions Taken?: Orders Received - See Orders for details

## 2018-07-29 NOTE — ED Triage Notes (Addendum)
Patient reports that she has had a fever x 5 days,  non productive cough, diarrhea, and nausea.  Patient was seen at the wound care center for the right ankle. Patient states she has been on doxycycline x 1 week. Patient continues to have swelling, pain of the right ankle. Patient states, while at the wound care center she stood up and felt a "pop" in her right ankle.

## 2018-07-29 NOTE — ED Notes (Signed)
Provider, Dr. Zenia Resides, performed venipuncture in the femoral vein on the right side to collect blood for admission to hospital. Pt tolerated well, pressure applied and dressed.

## 2018-07-29 NOTE — Progress Notes (Signed)
Pharmacy Antibiotic Note  [name] is a 48 y.o. female admitted on 07/29/2018 with osteomyeltis.  Pharmacy has been consulted for cefepime and vancomycin dosing.  Plan: Cefepime 2 Gm x1 then 500 mg IV q24h Vancomycin 1 Gm x1 F/u scr/level for additional dose F/u cultures  Height: 5\' 8"  (172.7 cm) Weight: 125 lb (56.7 kg) IBW/kg (Calculated) : 63.9  Temp (24hrs), Avg:98.8 F (37.1 C), Min:98.1 F (36.7 C), Max:100.1 F (37.8 C)  Recent Labs  Lab 07/29/18 1557 07/29/18 1802  WBC 24.7*  --   CREATININE  --  7.00*  LATICACIDVEN 1.6  --     Estimated Creatinine Clearance: 9 mL/min (A) (by C-G formula based on SCr of 7 mg/dL (H)).    Allergies  Allergen Reactions  . Cleocin [Clindamycin Hcl] Diarrhea  . Lisinopril Other (See Comments)    Elevated potassium per pt report  . Amoxicillin Diarrhea, Nausea Only and Other (See Comments)    Has patient had a PCN reaction causing immediate rash, facial/tongue/throat swelling, SOB or lightheadedness with hypotension: No Has patient had a PCN reaction causing severe rash involving mucus membranes or skin necrosis: No Has patient had a PCN reaction that required hospitalization: No Has patient had a PCN reaction occurring within the last 10 years: No If all of the above answers are "NO", then may proceed with Cephalosporin use.   . Bactrim [Sulfamethoxazole-Trimethoprim] Diarrhea and Nausea Only    Antimicrobials this admission: 4/28 cefepime >>  4/28 vancomycin >>   Dose adjustments this admission:   Microbiology results:  BCx:   UCx:    Sputum:    MRSA PCR:   Thank you for allowing pharmacy to be a part of this patient's care.  Dorrene German 07/29/2018 10:44 PM

## 2018-07-29 NOTE — ED Notes (Signed)
Pt resting. Meds given per MAR. Pt cleared to eat and drink so I gave her a sandwich and ice water. Awaiting transfer

## 2018-07-29 NOTE — ED Notes (Signed)
IV team was only able to obtain enough blood-work for a CBC, CMP, and lactic.

## 2018-07-29 NOTE — ED Provider Notes (Signed)
[place] DEPT Provider Note   CSN: [id number] Arrival date & time: 07/29/18  1444    History   Chief Complaint Chief Complaint  Patient presents with   Cough    HPI [name] is a 48 y.o. female.     HPI   47yF with multiple complaints. Increasing R foot pain, redness, swelling and fever. She has a past history of DM, PVD, osteomyelitis and amputations for infectious reasons. She is followed at the wound center. Placed on doxycyline about a week ago but symptoms progressing. Had [place] on 07/23/18 of RLE that was negative for DVT. She also reports a cough. No dyspnea. Diarrhea w/o blood. Nausea. No vomiting.   Past Medical History:  Diagnosis Date   Anemia    Arthritis    Bronchitis    C. difficile diarrhea 09/26/2014   Cataracts, both eyes    Cellulitis of right foot 06/02/2014   CKD (chronic kidney disease) stage 3, GFR 30-59 ml/min [facility])    sees Dr Justin Mend- Stage IV   Diabetic ulcer of right foot ([place]) 06/02/2014   DVT (deep venous thrombosis) ([place]) 10/2014   one in each one in leg- PICC line , one in right and left arm   H/O seasonal allergies    Heart murmur    told once when she was pregnant- no furter mention- was in notes 11/2015- had Echo 8 /4/17   History of blood transfusion    Hypercholesteremia    Hypertension    Left foot infection    Neuropathy    feet   Neuropathy in diabetes [facility])    Peripheral vascular disease ([place])    Sleep apnea    not able to use CPAP   Staphylococcus aureus bacteremia 10/2014   Type 1 diabetes [facility])    onset age 46    Patient Active Problem List   Diagnosis Date Noted   Gangrene of left foot ([place]) 05/08/2017   Osteomyelitis of left foot ([place]) 05/08/2017   Diabetic foot infection ([place])    Diabetic foot ulcer ([place]) 05/05/2017   Cellulitis 05/04/2017   Diabetes mellitus type 1 with peripheral artery disease ([place]) 01/16/2017   Anxiety 03/23/2016   Heart murmur  11/03/2015   Acute renal failure superimposed on stage 4 chronic kidney disease ([place]) 05/31/2015   Protein-calorie malnutrition, moderate ([place]) 10/18/2014   History of DVT (deep vein thrombosis)    Diabetic infection of left foot ([place]) 10/13/2014   Chronic anemia 10/13/2014   Status post transmetatarsal amputation of right foot ([place]) 09/26/2014   DM type 1 causing renal disease ([place]) 06/13/2013   CKD (chronic kidney disease), stage III ([place]) 06/12/2013   Charcot foot due to diabetes mellitus ([place]) 05/09/2011   Foot ulcer due to secondary DM ([place]) 05/09/2011   Type 1 diabetes mellitus with neurological manifestations, uncontrolled ([place])    Hypertension    Hypercholesteremia     Past Surgical History:  Procedure Laterality Date   AMPUTATION TOE Left 07/24/2015   5th toe and Hallux amputation   CESAREAN SECTION     EXOSTECTECTOMY TOE Left 04/11/2016   Procedure: EXOSTECTECTOMY CUBOID AND 5TH METATARSAL AND PLACEMENT OF ANTIBIOTIC BEADS;  Surgeon: Edrick Kins, DPM;  Location: [place];  Service: Podiatry;  Laterality: Left;   EYE SURGERY Bilateral    [place]   FEMORAL-POPLITEAL BYPASS GRAFT Left 05/03/2015   FOOT AMPUTATION THROUGH METATARSAL Right 2016   hemrrhoidectomy     I&D EXTREMITY Right 06/10/2014  Procedure: IRRIGATION AND DEBRIDEMENT Right Foot;  Surgeon: Newt Minion, MD;  Location: [place];  Service: Orthopedics;  Laterality: Right;   INCISION AND DRAINAGE Left 04/11/2016   Procedure: INCISION AND DRAINAGE A DEEP COMPLICATED WOUND OF LEFT FOOT;  Surgeon: Edrick Kins, DPM;  Location: [place];  Service: Podiatry;  Laterality: Left;   IR FLUORO GUIDE CV LINE RIGHT  09/24/2017   IR REMOVAL TUN CV CATH W/O FL  11/22/2017   IR US GUIDE VASC ACCESS RIGHT  09/24/2017   IRRIGATION AND DEBRIDEMENT ABSCESS Left 05/06/2017   Procedure: IRRIGATION AND DEBRIDEMENT ABSCESS LEFT FOOT;  Surgeon: Edrick Kins, DPM;  Location: [facility];  Service: Podiatry;  Laterality: Left;     PERIPHERALLY INSERTED CENTRAL CATHETER INSERTION     SKIN GRAFT Right 06/15/2014   SKIN GRAFT Left 06/2015   foot    SKIN SPLIT GRAFT Right 06/15/2014   Procedure: SPLIT THICKNESS SKIN GRAFT RIGHT FOOT;  Surgeon: Newt Minion, MD;  Location: [place];  Service: Orthopedics;  Laterality: Right;   TRANSMETATARSAL AMPUTATION Right    TRANSMETATARSAL AMPUTATION Right 09/11/2014     OB History    Gravida  1   Para      Term      Preterm      AB      Living        SAB      TAB      Ectopic      Multiple      Live Births               Home Medications    Prior to Admission medications   Medication Sig Start Date End Date Taking? Authorizing Provider  albuterol (PROVENTIL HFA;VENTOLIN HFA) 108 (90 BASE) MCG/ACT inhaler Inhale 2 puffs into the lungs every 6 (six) hours as needed for wheezing or shortness of breath. Reported on 04/15/2015    [provider]  amLODipine (NORVASC) 5 MG tablet Take 1 tablet (5 mg total) by mouth daily. Patient taking differently: Take 5 mg by mouth every evening.  05/10/17   Eugenie Filler, MD  aspirin 81 MG tablet Take 81 mg by mouth every evening.     [provider]  BD PEN NEEDLE NANO U/F 32G X 4 MM MISC USE AS DIRECTED TWICE A DAY 06/25/18   Elayne Snare, MD  calcitRIOL (ROCALTROL) 0.5 MCG capsule Take 1 capsule daily 08/13/17   Elayne Snare, MD  Calcium-Phosphorus-Vitamin D (CALCIUM GUMMIES) [phone] MG-MG-UNIT CHEW Chew 2 each by mouth every evening.     [provider]  CINNAMON PO Take 1 tablet by mouth every evening.     [provider]  Continuous Blood Gluc Receiver (FREESTYLE LIBRE READER) DEVI 1 Device by Does not apply route as directed. 02/28/17   Elayne Snare, MD  Continuous Blood Gluc Sensor (FREESTYLE LIBRE SENSOR SYSTEM) MISC Use 1 sensor every 10 days 02/28/17   Elayne Snare, MD  folic acid (FOLVITE) 1 MG tablet Take 1 mg by mouth every evening.     [provider]   furosemide (LASIX) 40 MG tablet Take 40 mg by mouth daily as needed for fluid.     [provider]  HUMALOG 100 UNIT/ML injection CALL MD IF <70, IF 151-200 =2UNITS,201-250 =4 UNITS,251-300 =6 UNITS, 301-350 =8 UNITS,351-400 =10 U 05/28/18   Elayne Snare, MD  ketorolac (ACULAR) 0.5 % ophthalmic solution PLACE 1 DROP IN RIGHT EYE FOUR TIMES A  DAY. START 1 WEEK PRIOR TO SURGERY 08/20/17   [provider]  loratadine (CLARITIN) 10 MG tablet Take 1 tablet (10 mg total) by mouth daily. Patient taking differently: Take 10 mg by mouth daily as needed for allergies.  05/11/17   Eugenie Filler, MD  norethindrone (AYGESTIN) 5 MG tablet Take by mouth. 06/26/17   [provider]  ondansetron (ZOFRAN-ODT) 4 MG disintegrating tablet DISSOLVE 1 TABLET ON TONGUE EVERY 8 HOURS AS NEEDED FOR NAUSEA 04/23/16   [provider]  pravastatin (PRAVACHOL) 40 MG tablet TAKE 1 TABLET BY MOUTH EVERY DAY 07/12/18   Elayne Snare, MD  pyridoxine (B-6) 100 MG tablet Take 100 mg by mouth daily. Reported on 04/15/2015    [provider]  vitamin B-12 (V-R VITAMIN B-12) 500 MCG tablet Take by mouth.    [provider]  vitamin C (ASCORBIC ACID) 500 MG tablet Take 500 mg by mouth daily.    [provider]  Vitamin D, Ergocalciferol, (DRISDOL) 50000 units CAPS capsule TAKE ONE CAPSULE BY MOUTH ONE TIME PER WEEK 11/02/17   Elayne Snare, MD    Family History Family History  Problem Relation Age of Onset   Hyperlipidemia Mother    Dementia Mother     Social History Social History   Tobacco Use   Smoking status: Former Smoker    Years: 15.00    Last attempt to quit: 05/16/2008    Years since quitting: 10.2   Smokeless tobacco: Never Used  Substance Use Topics   Alcohol use: Yes    Comment: Occasional   Drug use: No     Allergies   Cleocin [clindamycin hcl]; Lisinopril; Amoxicillin; and Bactrim [sulfamethoxazole-trimethoprim]   Review of Systems Review of  Systems  All systems reviewed and negative, other than as noted in HPI.  Physical Exam Updated Vital Signs BP (!) 148/76 (BP Location: Left Arm)    Pulse (!) 123    Temp 100.1 F (37.8 C) (Oral)    Resp 20    Ht 5\' 8"  (1.727 m)    Wt 56.7 kg    LMP 06/28/2018    SpO2 100%    BMI 19.01 kg/m   Physical Exam Vitals signs and nursing note reviewed.  Constitutional:      General: She is not in acute distress.    Appearance: She is well-developed.  HENT:     Head: Normocephalic and atraumatic.  Eyes:     General:        Right eye: No discharge.        Left eye: No discharge.     Conjunctiva/sclera: Conjunctivae normal.  Neck:     Musculoskeletal: Neck supple.  Cardiovascular:     Rate and Rhythm: Regular rhythm. Tachycardia present.     Heart sounds: Normal heart sounds. No murmur. No friction rub. No gallop.   Pulmonary:     Effort: Pulmonary effort is normal. No respiratory distress.     Breath sounds: Normal breath sounds.  Abdominal:     General: There is no distension.     Palpations: Abdomen is soft.     Tenderness: There is no abdominal tenderness.  Musculoskeletal:     Comments: Partial r foot amputation. No open wounds. Swelling, erythema, tenderness of foot, ankle and distal shin.   Skin:    General: Skin is warm and dry.  Neurological:     Mental Status: She is alert.  Psychiatric:        Behavior: Behavior  normal.        Thought Content: Thought content normal.      ED Treatments / Results  Labs (all labs ordered are listed, but only abnormal results are displayed) Labs Reviewed  SEDIMENTATION RATE - Abnormal; Notable for the following components:      Result Value   Sed Rate 107 (*)    All other components within normal limits  C-REACTIVE PROTEIN - Abnormal; Notable for the following components:   CRP 29.0 (*)    All other components within normal limits  URINALYSIS, ROUTINE W REFLEX MICROSCOPIC - Abnormal; Notable for the following components:    APPearance HAZY (*)    Glucose, UA >=500 (*)    Hgb urine dipstick SMALL (*)    Protein, ur >=300 (*)    Nitrite POSITIVE (*)    Leukocytes,Ua SMALL (*)    Bacteria, UA MANY (*)    All other components within normal limits  CBC WITH DIFFERENTIAL/PLATELET - Abnormal; Notable for the following components:   WBC 24.7 (*)    RBC 3.04 (*)    Hemoglobin 8.5 (*)    HCT 26.8 (*)    Platelets 528 (*)    Neutro Abs 22.8 (*)    Lymphs Abs 0.5 (*)    Monocytes Absolute 1.1 (*)    Abs Immature Granulocytes 0.21 (*)    All other components within normal limits  COMPREHENSIVE METABOLIC PANEL - Abnormal; Notable for the following components:   Sodium 133 (*)    Chloride 113 (*)    CO2 <7 (*)    Glucose, Bld 61 (*)    BUN 101 (*)    Creatinine, Ser 7.00 (*)    Calcium 6.2 (*)    Total Protein 6.3 (*)    Albumin 2.1 (*)    Alkaline Phosphatase 249 (*)    GFR calc non Af Amer 6 (*)    GFR calc Af Amer 7 (*)    All other components within normal limits  HEMOGLOBIN A1C - Abnormal; Notable for the following components:   Hgb A1c MFr Bld 10.4 (*)    All other components within normal limits  APTT - Abnormal; Notable for the following components:   aPTT 42 (*)    All other components within normal limits  PROTIME-INR - Abnormal; Notable for the following components:   Prothrombin Time 17.2 (*)    INR 1.4 (*)    All other components within normal limits  BASIC METABOLIC PANEL - Abnormal; Notable for the following components:   CO2 7 (*)    Glucose, Bld 147 (*)    BUN 102 (*)    Creatinine, Ser 7.23 (*)    Calcium 6.8 (*)    GFR calc non Af Amer 6 (*)    GFR calc Af Amer 7 (*)    Anion gap 19 (*)    All other components within normal limits  CBC - Abnormal; Notable for the following components:   WBC 27.0 (*)    RBC 2.93 (*)    Hemoglobin 8.2 (*)    HCT 25.3 (*)    Platelets 597 (*)    All other components within normal limits  MAGNESIUM - Abnormal; Notable for the following  components:   Magnesium 1.3 (*)    All other components within normal limits  PHOSPHORUS - Abnormal; Notable for the following components:   Phosphorus 5.9 (*)    All other components within normal limits  IRON AND TIBC - Abnormal; Notable  for the following components:   Iron 7 (*)    TIBC 209 (*)    Saturation Ratios 3 (*)    All other components within normal limits  GLUCOSE, CAPILLARY - Abnormal; Notable for the following components:   Glucose-Capillary 145 (*)    All other components within normal limits  GLUCOSE, CAPILLARY - Abnormal; Notable for the following components:   Glucose-Capillary 135 (*)    All other components within normal limits  SARS CORONAVIRUS 2 (HOSPITAL ORDER, [facility])  CULTURE, BLOOD (ROUTINE X 2)  CULTURE, BLOOD (ROUTINE X 2)  C DIFFICILE QUICK SCREEN W PCR REFLEX  RESPIRATORY PANEL BY PCR  LACTIC ACID, PLASMA  TSH  FERRITIN  VITAMIN B12  LACTIC ACID, PLASMA  HIV ANTIBODY (ROUTINE TESTING W REFLEX)  COMPREHENSIVE METABOLIC PANEL  BLOOD GAS, VENOUS  NA AND K (SODIUM & POTASSIUM), RAND UR  PROTEIN / CREATININE RATIO, URINE  CALCIUM, IONIZED  FOLATE RBC    EKG None  Radiology US Renal  Result Date: 07/30/2018 CLINICAL DATA:  Initial evaluation for acute renal injury. History of diabetes. EXAM: RENAL / URINARY TRACT ULTRASOUND COMPLETE COMPARISON:  Prior CT from 02/01/2014 FINDINGS: Right Kidney: Renal measurements: 9.0 x 4.4 x 4.6 cm = volume: 94.9 mL. Increased echogenicity within the renal parenchyma. No mass or hydronephrosis visualized. Left Kidney: Renal measurements: 8.3 x 4.5 x 4.7 cm = volume: 93.9 mL. Increased echogenicity within the renal parenchyma. No mass or hydronephrosis visualized. Bladder: Mild circumferential bladder wall thickening, likely related incomplete distension. IMPRESSION: 1. Increased echogenicity within the renal parenchyma, compatible with medical renal disease. 2. No hydronephrosis.  Electronically Signed   By: Jeannine Boga M.D.   On: 07/30/2018 01:37   Mr Ankle Right Wo Contrast  Result Date: 07/29/2018 CLINICAL DATA:  Pain and swelling of the right ankle. Previous amputation of the foot at the level of the bases of the metatarsals. EXAM: MRI OF THE RIGHT ANKLE WITHOUT CONTRAST TECHNIQUE: Multiplanar, multisequence MR imaging of the ankle was performed. No intravenous contrast was administered. COMPARISON:  MRI dated 05/05/2018 and radiographs dated 08/06/2017 FINDINGS: TENDONS Peroneal: There is a tiny amount of fluid in the peroneus longus tendon sheath, unchanged since the prior study. Posteromedial: There is increased fluid in the sheath of the posterior tibialis tendon just distal to the tip of the medial malleolus but this is minimal. Anterior: There is tenosynovitis involving the stumps of the tibialis anterior tendon and extensor hallucis longus tendon. The fluid, likely pus, extends from the stumps of the tendons into the first tarsal metatarsal joint and around the base of the first metatarsal. Achilles: Normal. Plantar Fascia: Normal. LIGAMENTS Lateral: Intact. Medial: Intact. CARTILAGE Ankle Joint: No significant abnormality. Subtalar Joints/Sinus Tarsi: Progressive arthritis of the posterior facets of the subtalar joint. Ligaments of the sinus tarsi are intact. Bones: The patient has developed patchy mottled signal abnormalities in bases of the first and second metatarsal stumps and to a lesser degree in the bases of the third, fourth and fifth metatarsal stumps. There is also new patchy signal abnormality in the medial and middle cuneiforms as well as in the navicular and to a lesser degree in the lateral cuneiform and cuboid. Other: Diffuse subcutaneous edema around the ankle and in the foot. IMPRESSION: 1. Findings consistent with infectious tenosynovitis of the anterior tibialis tendon and extensor hallucis longus tendon with pus extending into the first tarsal  metatarsal joint. Probable osteomyelitis involving the medial cuneiform and bases of  the first and second metatarsals. I suspect there is less severe osteomyelitis involving the other metatarsal bases, the middle and lateral cuneiforms, and the navicular and possibly the cuboid. Electronically Signed   By: Lorriane Shire M.D.   On: 07/29/2018 20:27   Dg Chest Portable 1 View  Result Date: 07/29/2018 CLINICAL DATA:  Fever for 5 days, nonproductive cough, diarrhea, nausea EXAM: PORTABLE CHEST 1 VIEW COMPARISON:  Portable exam 1518 hours compared to 05/04/2017 FINDINGS: Normal heart size, mediastinal contours, and pulmonary vascularity. Peribronchial thickening, new. No acute infiltrate, pleural effusion, or pneumothorax. Bones appear demineralized. IMPRESSION: Bronchitic changes without infiltrate. Electronically Signed   By: Lavonia Dana M.D.   On: 07/29/2018 16:15    Procedures Procedures (including critical care time)  Medications Ordered in ED Medications - No data to display   Initial Impression / Assessment and Plan / ED Course  I have reviewed the triage vital signs and the nursing notes.  Pertinent labs & imaging results that were available during my care of the patient were reviewed by me and considered in my medical decision making (see chart for details).    Suspect cellulitis RLE/foot. Will admit for IV abx because of progression despite doxycycline. Will MR to evaluate for possible osteomyelitis given her history. Blood cultures and CPR/Sed rate. Also cough. o2 sats fine. No increased WOB. Will check for possible COVID given the current climate although clinically less likely. CXR with no focal infiltrate.  Nonbloody diarrhea w/o abdominal tenderness. Hx of cdiff and recent abx. Will check cdfiff. Hydration. Symptomatic tx.   INOCENCIA MURTAUGH was evaluated in Emergency Department on 07/30/2018 for the symptoms described in the history of present illness. She was evaluated in the  context of the global COVID-19 pandemic, which necessitated consideration that the patient might be at risk for infection with the SARS-CoV-2 virus that causes COVID-19. Institutional protocols and algorithms that pertain to the evaluation of patients at risk for COVID-19 are in a state of rapid change based on information released by regulatory bodies including the CDC and federal and state organizations. These policies and algorithms were followed during the patient's care in the ED.   Final Clinical Impressions(s) / ED Diagnoses   Final diagnoses:  Cellulitis of right foot  Other acute osteomyelitis of right foot [facility])    ED Discharge Orders    None       Virgel Manifold, MD 07/30/18 [phone]

## 2018-07-29 NOTE — ED Notes (Signed)
[facility] called for pt transport

## 2018-07-29 NOTE — ED Notes (Addendum)
Pt very difficult stick, unable to obtain blood work at this time

## 2018-07-29 NOTE — H&P (Signed)
History and Physical    [name] QHK:[id number] DOB: 09/06/1970 DOA: 07/29/2018  PCP: Loyola Mast, PA-C Patient coming from: Home  I have personally briefly reviewed patient's old medical records in [place]  Chief Complaint: Right foot swelling and pain  HPI: [name] is a 48 y.o. female with medical history significant for poorly-controlled type 1 DM with history of multiple diabetic foot wounds requiring multiple amputations, hypertension and hyperlipidemia presenting from [place] with pain and swelling in her right lower extremity despite 1 week of doxycycline therapy. She denies any recent trauma to that extremity. She apparently had an Duplex ultrasound performed on 4/22 which was negative for DVT. She endorses subjective fever and chills. She states that the redness and swelling have worsened over the last week. She also complains of dry cough since Saturday. She denies sick contacts or recent travel. She endorses a history of seasonal allergies. Review of systems also positive for nausea and frothy urine. She denies decrease in urine output or change in urine color. She denies any foreign ingestions, denies grain alcohol or wood alcohol ingestion. She denies any new medications. No NSAID use.  ED Course: In the ED, Tmax was 100.60F, HRmax 123. She was otherwise normotensive and saturating comfortably on room air. MRI of the right ankle demonstrated tenosynovitis of the anterior tibialis and extensor hallicus longus with likely osteomyelitis of the first and second metatarsals. Labs notable for WBC 24.7 (92% PMN, 1% bands), Hgb 8.5, platelets 528, CO2 <7, BUN 101, Cr 7.00 (basline 3.5-4.0), Ca 6.2 (7.7 corrected), albumin 2.1, alkaline phosphatase 249, ESR 107, CRP 29, lactic acid 1.6. SARS Co-V 2 testing negative. CXR showed bronchitic changes without focal opacities or infiltrate. Orthopedic Surgery was consulted and recommended admission to [facility] for  operative management of tenosynovitis and osteomyelitis. She was treated with vancomycin and cefepime after blood cultures were drawn.  Review of Systems: As per HPI otherwise 10 point review of systems negative.   Past Medical History:  Diagnosis Date   Anemia    Arthritis    Bronchitis    C. difficile diarrhea 09/26/2014   Cataracts, both eyes    Cellulitis of right foot 06/02/2014   CKD (chronic kidney disease) stage 3, GFR 30-59 ml/min (HCC)    sees Dr Justin Mend- Stage IV   Diabetic ulcer of right foot ([place]) 06/02/2014   DVT (deep venous thrombosis) ([place]) 10/2014   one in each one in leg- PICC line , one in right and left arm   H/O seasonal allergies    Heart murmur    told once when she was pregnant- no furter mention- was in notes 11/2015- had Echo 8 /4/17   History of blood transfusion    Hypercholesteremia    Hypertension    Left foot infection    Neuropathy    feet   Neuropathy in diabetes ([place])    Peripheral vascular disease ([place])    Sleep apnea    not able to use CPAP   Staphylococcus aureus bacteremia 10/2014   Type 1 diabetes [facility])    onset age 36    Past Surgical History:  Procedure Laterality Date   AMPUTATION TOE Left 07/24/2015   5th toe and Hallux amputation   CESAREAN SECTION     EXOSTECTECTOMY TOE Left 04/11/2016   Procedure: EXOSTECTECTOMY CUBOID AND 5TH METATARSAL AND PLACEMENT OF ANTIBIOTIC BEADS;  Surgeon: Edrick Kins, DPM;  Location: [facility];  Service: Podiatry;  Laterality: Left;  EYE SURGERY Bilateral    [place]   FEMORAL-POPLITEAL BYPASS GRAFT Left 05/03/2015   FOOT AMPUTATION THROUGH METATARSAL Right 2016   hemrrhoidectomy     I&D EXTREMITY Right 06/10/2014   Procedure: IRRIGATION AND DEBRIDEMENT Right Foot;  Surgeon: Newt Minion, MD;  Location: [place];  Service: Orthopedics;  Laterality: Right;   INCISION AND DRAINAGE Left 04/11/2016   Procedure: INCISION AND DRAINAGE A DEEP COMPLICATED WOUND OF LEFT FOOT;  Surgeon: Edrick Kins, DPM;  Location: [place] [facility];  Service: Podiatry;  Laterality: Left;   IR FLUORO GUIDE CV LINE RIGHT  09/24/2017   IR REMOVAL TUN CV CATH W/O FL  11/22/2017   IR US GUIDE VASC ACCESS RIGHT  09/24/2017   IRRIGATION AND DEBRIDEMENT ABSCESS Left 05/06/2017   Procedure: IRRIGATION AND DEBRIDEMENT ABSCESS LEFT FOOT;  Surgeon: Edrick Kins, DPM;  Location: [facility];  Service: Podiatry;  Laterality: Left;   PERIPHERALLY INSERTED CENTRAL CATHETER INSERTION     SKIN GRAFT Right 06/15/2014   SKIN GRAFT Left 06/2015   foot    SKIN SPLIT GRAFT Right 06/15/2014   Procedure: SPLIT THICKNESS SKIN GRAFT RIGHT FOOT;  Surgeon: Newt Minion, MD;  Location: [facility];  Service: Orthopedics;  Laterality: Right;   TRANSMETATARSAL AMPUTATION Right    TRANSMETATARSAL AMPUTATION Right 09/11/2014     reports that she quit smoking about 10 years ago. She quit after 15.00 years of use. She has never used smokeless tobacco. She reports current alcohol use. She reports that she does not use drugs.  Allergies  Allergen Reactions   Cleocin [Clindamycin Hcl] Diarrhea   Lisinopril Other (See Comments)    Elevated potassium per pt report   Amoxicillin Diarrhea, Nausea Only and Other (See Comments)    Has patient had a PCN reaction causing immediate rash, facial/tongue/throat swelling, SOB or lightheadedness with hypotension: No Has patient had a PCN reaction causing severe rash involving mucus membranes or skin necrosis: No Has patient had a PCN reaction that required hospitalization: No Has patient had a PCN reaction occurring within the last 10 years: No If all of the above answers are "NO", then may proceed with Cephalosporin use.    Bactrim [Sulfamethoxazole-Trimethoprim] Diarrhea and Nausea Only    Family History  Problem Relation Age of Onset   Hyperlipidemia Mother    Dementia Mother     Prior to Admission medications   Medication Sig Start Date End Date Taking? Authorizing Provider  albuterol  (PROVENTIL HFA;VENTOLIN HFA) 108 (90 BASE) MCG/ACT inhaler Inhale 2 puffs into the lungs every 6 (six) hours as needed for wheezing or shortness of breath. Reported on 04/15/2015    [provider]  amLODipine (NORVASC) 5 MG tablet Take 1 tablet (5 mg total) by mouth daily. Patient taking differently: Take 5 mg by mouth every evening.  05/10/17   Eugenie Filler, MD  aspirin 81 MG tablet Take 81 mg by mouth every evening.     [provider]  BD PEN NEEDLE NANO U/F 32G X 4 MM MISC USE AS DIRECTED TWICE A DAY 06/25/18   Elayne Snare, MD  calcitRIOL (ROCALTROL) 0.5 MCG capsule Take 1 capsule daily 08/13/17   Elayne Snare, MD  Calcium-Phosphorus-Vitamin D (CALCIUM GUMMIES) [phone] MG-MG-UNIT CHEW Chew 2 each by mouth every evening.     [provider]  CINNAMON PO Take 1 tablet by mouth every evening.     [provider]  Continuous Blood Gluc Receiver (FREESTYLE LIBRE READER) DEVI 1  Device by Does not apply route as directed. 02/28/17   Elayne Snare, MD  Continuous Blood Gluc Sensor (FREESTYLE LIBRE SENSOR SYSTEM) MISC Use 1 sensor every 10 days 02/28/17   Elayne Snare, MD  folic acid (FOLVITE) 1 MG tablet Take 1 mg by mouth every evening.     [provider]  furosemide (LASIX) 40 MG tablet Take 40 mg by mouth daily as needed for fluid.     [provider]  HUMALOG 100 UNIT/ML injection CALL MD IF <70, IF 151-200 =2UNITS,201-250 =4 UNITS,251-300 =6 UNITS, 301-350 =8 UNITS,351-400 =10 U 05/28/18   Elayne Snare, MD  ketorolac (ACULAR) 0.5 % ophthalmic solution PLACE 1 DROP IN RIGHT EYE FOUR TIMES A DAY. START 1 WEEK PRIOR TO SURGERY 08/20/17   [provider]  loratadine (CLARITIN) 10 MG tablet Take 1 tablet (10 mg total) by mouth daily. Patient taking differently: Take 10 mg by mouth daily as needed for allergies.  05/11/17   Eugenie Filler, MD  norethindrone (AYGESTIN) 5 MG tablet Take by mouth. 06/26/17   [provider]    ondansetron (ZOFRAN-ODT) 4 MG disintegrating tablet DISSOLVE 1 TABLET ON TONGUE EVERY 8 HOURS AS NEEDED FOR NAUSEA 04/23/16   [provider]  pravastatin (PRAVACHOL) 40 MG tablet TAKE 1 TABLET BY MOUTH EVERY DAY 07/12/18   Elayne Snare, MD  pyridoxine (B-6) 100 MG tablet Take 100 mg by mouth daily. Reported on 04/15/2015    [provider]  vitamin B-12 (V-R VITAMIN B-12) 500 MCG tablet Take by mouth.    [provider]  vitamin C (ASCORBIC ACID) 500 MG tablet Take 500 mg by mouth daily.    [provider]  Vitamin D, Ergocalciferol, (DRISDOL) 50000 units CAPS capsule TAKE ONE CAPSULE BY MOUTH ONE TIME PER WEEK 11/02/17   Elayne Snare, MD    Physical Exam: Vitals:   07/29/18 1455 07/29/18 1456 07/29/18 1950 07/29/18 2159  BP: (!) 148/76  132/80 136/79  Pulse:   100 99  Resp: '20  18 18  '$ Temp:   98.1 F (36.7 C) 98.2 F (36.8 C)  TempSrc:   Oral Oral  SpO2: 100%  100% 99%  Weight:  56.7 kg    Height:  '5\' 8"'$  (1.727 m)      Constitutional: NAD, calm, comfortable Eyes: PERRL, lids and conjunctivae normal ENMT: Mucous membranes are moist. Posterior pharynx clear of any exudate or lesions. Neck: normal, supple, no masses Respiratory: clear to auscultation bilaterally, no wheezing, no crackles. Normal respiratory effort. Cardiovascular: Tachycardic, no murmurs / rubs / gallops. 2+ pitting edema of RLE. 1+ pedal pulses. Abdomen: no tenderness, no masses palpated. Bowel sounds positive.  Musculoskeletal: no clubbing / cyanosis. S/p partial amputation of right foot and left 4th and 5th metatarsals. Good ROM, no contractures. Normal muscle tone.  Skin: Erythema and swelling of RLE, no rash or petechiae Neurologic: CN 2-12 grossly intact. Pinprick sensation diminished in BLE. Strength 5/5 in all 4.  Psychiatric: Normal judgment and insight. Alert and oriented x 3. Normal mood.    Labs on Admission: I have personally reviewed following labs and imaging  studies  CBC: Recent Labs  Lab 07/29/18 1557  WBC 24.7*  NEUTROABS 22.8*  HGB 8.5*  HCT 26.8*  MCV 88.2  PLT 194*   Basic Metabolic Panel: Recent Labs  Lab 07/29/18 1802  NA 133*  K 4.5  CL 113*  CO2 <7*  GLUCOSE 61*  BUN 101*  CREATININE 7.00*  CALCIUM 6.2*  GFR: Estimated Creatinine Clearance: 9 mL/min (A) (by C-G formula based on SCr of 7 mg/dL (H)). Liver Function Tests: Recent Labs  Lab 07/29/18 1802  AST 16  ALT 18  ALKPHOS 249*  BILITOT 0.4  PROT 6.3*  ALBUMIN 2.1*   No results for input(s): LIPASE, AMYLASE in the last 168 hours. No results for input(s): AMMONIA in the last 168 hours. Coagulation Profile: No results for input(s): INR, PROTIME in the last 168 hours. Cardiac Enzymes: No results for input(s): CKTOTAL, CKMB, CKMBINDEX, TROPONINI in the last 168 hours. BNP (last 3 results) No results for input(s): PROBNP in the last 8760 hours. HbA1C: No results for input(s): HGBA1C in the last 72 hours. CBG: No results for input(s): GLUCAP in the last 168 hours. Lipid Profile: No results for input(s): CHOL, HDL, LDLCALC, TRIG, CHOLHDL, LDLDIRECT in the last 72 hours. Thyroid Function Tests: No results for input(s): TSH, T4TOTAL, FREET4, T3FREE, THYROIDAB in the last 72 hours. Anemia Panel: No results for input(s): VITAMINB12, FOLATE, FERRITIN, TIBC, IRON, RETICCTPCT in the last 72 hours. Urine analysis:    Component Value Date/Time   COLORURINE YELLOW 07/29/2018 1524   APPEARANCEUR HAZY (A) 07/29/2018 1524   LABSPEC 1.010 07/29/2018 1524   PHURINE 5.0 07/29/2018 1524   GLUCOSEU >=500 (A) 07/29/2018 1524   GLUCOSEU 500 (A) 01/26/2015 1200   HGBUR SMALL (A) 07/29/2018 1524   BILIRUBINUR NEGATIVE 07/29/2018 1524   KETONESUR NEGATIVE 07/29/2018 1524   PROTEINUR >=300 (A) 07/29/2018 1524   UROBILINOGEN 0.2 01/26/2015 1200   NITRITE POSITIVE (A) 07/29/2018 1524   LEUKOCYTESUR SMALL (A) 07/29/2018 1524    Radiological Exams on Admission: Mr  Ankle Right Wo Contrast  Result Date: 07/29/2018 CLINICAL DATA:  Pain and swelling of the right ankle. Previous amputation of the foot at the level of the bases of the metatarsals. EXAM: MRI OF THE RIGHT ANKLE WITHOUT CONTRAST TECHNIQUE: Multiplanar, multisequence MR imaging of the ankle was performed. No intravenous contrast was administered. COMPARISON:  MRI dated 05/05/2018 and radiographs dated 08/06/2017 FINDINGS: TENDONS Peroneal: There is a tiny amount of fluid in the peroneus longus tendon sheath, unchanged since the prior study. Posteromedial: There is increased fluid in the sheath of the posterior tibialis tendon just distal to the tip of the medial malleolus but this is minimal. Anterior: There is tenosynovitis involving the stumps of the tibialis anterior tendon and extensor hallucis longus tendon. The fluid, likely pus, extends from the stumps of the tendons into the first tarsal metatarsal joint and around the base of the first metatarsal. Achilles: Normal. Plantar Fascia: Normal. LIGAMENTS Lateral: Intact. Medial: Intact. CARTILAGE Ankle Joint: No significant abnormality. Subtalar Joints/Sinus Tarsi: Progressive arthritis of the posterior facets of the subtalar joint. Ligaments of the sinus tarsi are intact. Bones: The patient has developed patchy mottled signal abnormalities in bases of the first and second metatarsal stumps and to a lesser degree in the bases of the third, fourth and fifth metatarsal stumps. There is also new patchy signal abnormality in the medial and middle cuneiforms as well as in the navicular and to a lesser degree in the lateral cuneiform and cuboid. Other: Diffuse subcutaneous edema around the ankle and in the foot. IMPRESSION: 1. Findings consistent with infectious tenosynovitis of the anterior tibialis tendon and extensor hallucis longus tendon with pus extending into the first tarsal metatarsal joint. Probable osteomyelitis involving the medial cuneiform and bases of the  first and second metatarsals. I suspect there is less severe osteomyelitis involving the other metatarsal  bases, the middle and lateral cuneiforms, and the navicular and possibly the cuboid. Electronically Signed   By: Lorriane Shire M.D.   On: 07/29/2018 20:27   Dg Chest Portable 1 View  Result Date: 07/29/2018 CLINICAL DATA:  Fever for 5 days, nonproductive cough, diarrhea, nausea EXAM: PORTABLE CHEST 1 VIEW COMPARISON:  Portable exam 1518 hours compared to 05/04/2017 FINDINGS: Normal heart size, mediastinal contours, and pulmonary vascularity. Peribronchial thickening, new. No acute infiltrate, pleural effusion, or pneumothorax. Bones appear demineralized. IMPRESSION: Bronchitic changes without infiltrate. Electronically Signed   By: Lavonia Dana M.D.   On: 07/29/2018 16:15    Assessment/Plan Active Problems:   Osteomyelitis of right foot [facility])  [name] presents with sepsis 2/2 tenosynovitis and osteomyelitis of the right lower extremity. She has a prior history of MRSA wound infection with multiple amputations. In addition, she has acute on chronic kidney injury of uncertain etiology with profound anion gap metabolic acidosis, presumably secondary to worsening azotemia along with hyperchloremia. Anion gap is 13 (17 when corrected for albumin). Mentation is intact and electrolytes are normal, so there is no acute indication for dialysis at this time, however, it is certainly likely that this patient will require dialysis. Her complaint of frothy urine raises concern for possible nephrotic range proteinuria. This can occur in the setting of poorly controlled diabetes, however, additional etiologies of diabetic nephropathy should be considered if urine protein to creatinine ratio is elevated. Lastly, her dry cough with bronchitic changes is most likely due to UACS related to seasonal allergies. SARS Co-V 2 testing is negative. Given her low grade temperature and systemic symptoms, would be prudent to  obtain RVP.  Sepsis 2/2 tenosynovitis and osteomyelitis of the right lower extremity - Orthopedic Surgery consulted, plan for operative management - Continue vancomycin and cefepime - Follow up culture data - Check coags - Pain control, bowel regimen - Trend fever curve  AKI on CKD Anion gap metabolic acidosis - Nephrology consult in AM - Start D5-sodium bicarb infusion - Obtain VBG, monitor CO2 on BMP closely - Repeat lactic acid to ensure not rising - Obtain urine studies, renal ultrasound - Hold home Lasix - No acute indication for dialysis at this time - Strict I/O, daily weights - Avoid nephrotoxins - Renally dose medications for CrCl <10 - Daily BMP, Mg, PO4  Hypocalcemia - Low level hypocalcemia does not require correct in setting of renal disease - Check PO4 levels as above - Consider checking PTH and Vit D levels if clinically indicated  Dry cough - Abx as above - Check RVP - Can use albuterol HFA Q4H PRN - Tessalon Perles PRN - Continue home Zyrtec  Poorly controlled DM1 - NPO at midnight - D5 containing fluids as above - SSI Q4H, will need to resume basal insulin post-operatively - Check A1c  Chronic anemia of renal disease - No signs or sxs of active bleeding - Check iron profile, B12, folate - Check coags as above - Goal Hgb >7  HTN - Continue home amlodipine - Holding home Lasix  HLD - Continue home statin  DVT prophylaxis: SQH Code Status: Full Disposition Plan: Home in 3-5 days Consults called: Orthopedic Surgery (Dr. Mechele Dawley) Admission status: Progressive   Rogene Meth Sharene Butters MD [facility]  If 7PM-7AM, please contact night-coverage [email or web address] Password [facility]  07/29/2018, 10:43 PM

## 2018-07-29 NOTE — ED Notes (Signed)
Addressed kidney function with provider before initiating vancomycin. Pt cleared to receive medication via nephrology.

## 2018-07-30 ENCOUNTER — Telehealth: Payer: Self-pay | Admitting: *Deleted

## 2018-07-30 ENCOUNTER — Encounter (HOSPITAL_COMMUNITY): Payer: Self-pay | Admitting: General Practice

## 2018-07-30 ENCOUNTER — Inpatient Hospital Stay (HOSPITAL_COMMUNITY): Payer: Medicare Other

## 2018-07-30 DIAGNOSIS — L03115 Cellulitis of right lower limb: Secondary | ICD-10-CM

## 2018-07-30 DIAGNOSIS — R6520 Severe sepsis without septic shock: Secondary | ICD-10-CM

## 2018-07-30 DIAGNOSIS — A419 Sepsis, unspecified organism: Secondary | ICD-10-CM

## 2018-07-30 DIAGNOSIS — M86671 Other chronic osteomyelitis, right ankle and foot: Secondary | ICD-10-CM

## 2018-07-30 LAB — PROTIME-INR
INR: 1.4 — ABNORMAL HIGH (ref 0.8–1.2)
Prothrombin Time: 17.2 seconds — ABNORMAL HIGH (ref 11.4–15.2)

## 2018-07-30 LAB — RESPIRATORY PANEL BY PCR

## 2018-07-30 LAB — BASIC METABOLIC PANEL
Anion gap: 19 — ABNORMAL HIGH (ref 5–15)
BUN: 102 mg/dL — ABNORMAL HIGH (ref 6–20)
CO2: 7 mmol/L — ABNORMAL LOW (ref 22–32)
Calcium: 6.8 mg/dL — ABNORMAL LOW (ref 8.9–10.3)
Chloride: 109 mmol/L (ref 98–111)
Creatinine, Ser: 7.23 mg/dL — ABNORMAL HIGH (ref 0.44–1.00)
GFR calc Af Amer: 7 mL/min — ABNORMAL LOW (ref >60)
GFR calc non Af Amer: 6 mL/min — ABNORMAL LOW (ref >60)
Glucose, Bld: 147 mg/dL — ABNORMAL HIGH (ref 70–99)
Potassium: 4.5 mmol/L (ref 3.5–5.1)
Sodium: 135 mmol/L (ref 135–145)

## 2018-07-30 LAB — CBC
HCT: 25.3 % — ABNORMAL LOW (ref 36.0–46.0)
Hemoglobin: 8.2 g/dL — ABNORMAL LOW (ref 12.0–15.0)
MCH: 28 pg (ref 26.0–34.0)
MCHC: 32.4 g/dL (ref 30.0–36.0)
MCV: 86.3 fL (ref 80.0–100.0)
Platelets: 597 10*3/uL — ABNORMAL HIGH (ref 150–400)
RBC: 2.93 MIL/uL — ABNORMAL LOW (ref 3.87–5.11)
RDW: 14.3 % (ref 11.5–15.5)
WBC: 27 10*3/uL — ABNORMAL HIGH (ref 4.0–10.5)
nRBC: 0 % (ref 0.0–0.2)

## 2018-07-30 LAB — HEMOGLOBIN A1C
Hgb A1c MFr Bld: 10.4 % — ABNORMAL HIGH (ref 4.8–5.6)
Mean Plasma Glucose: 251.78 mg/dL

## 2018-07-30 LAB — GLUCOSE, CAPILLARY
Glucose-Capillary: 145 mg/dL — ABNORMAL HIGH (ref 70–99)
Glucose-Capillary: 135 mg/dL — ABNORMAL HIGH (ref 70–99)
Glucose-Capillary: 228 mg/dL — ABNORMAL HIGH (ref 70–99)
Glucose-Capillary: 237 mg/dL — ABNORMAL HIGH (ref 70–99)
Glucose-Capillary: 258 mg/dL — ABNORMAL HIGH (ref 70–99)
Glucose-Capillary: 106 mg/dL — ABNORMAL HIGH (ref 70–99)
Glucose-Capillary: 151 mg/dL — ABNORMAL HIGH (ref 70–99)

## 2018-07-30 LAB — IRON AND TIBC
Iron: 7 ug/dL — ABNORMAL LOW (ref 28–170)
Saturation Ratios: 3 % — ABNORMAL LOW (ref 10.4–31.8)
TIBC: 209 ug/dL — ABNORMAL LOW (ref 250–450)
UIBC: 202 ug/dL

## 2018-07-30 LAB — FERRITIN: Ferritin: 232 ng/mL (ref 11–307)

## 2018-07-30 LAB — MAGNESIUM: Magnesium: 1.3 mg/dL — ABNORMAL LOW (ref 1.7–2.4)

## 2018-07-30 LAB — PHOSPHORUS: Phosphorus: 5.9 mg/dL — ABNORMAL HIGH (ref 2.5–4.6)

## 2018-07-30 LAB — VITAMIN B12: Vitamin B-12: 788 pg/mL (ref 180–914)

## 2018-07-30 LAB — APTT: aPTT: 42 seconds — ABNORMAL HIGH (ref 24–36)

## 2018-07-30 LAB — TSH: TSH: 2.021 u[IU]/mL (ref 0.350–4.500)

## 2018-07-30 LAB — MRSA PCR SCREENING: MRSA by PCR: POSITIVE — AB

## 2018-07-30 MED ORDER — INSULIN PUMP
Freq: Three times a day (TID) | SUBCUTANEOUS | Status: DC
  Administered 2018-07-30: 21:00:00 via SUBCUTANEOUS
  Administered 2018-07-30: 17:00:00 1 via SUBCUTANEOUS
  Administered 2018-07-30: 0.5 via SUBCUTANEOUS
  Administered 2018-07-31: via SUBCUTANEOUS

## 2018-07-30 MED ORDER — MUPIROCIN 2 % EX OINT
1.0000 "application " | TOPICAL_OINTMENT | Freq: Two times a day (BID) | CUTANEOUS | Status: AC
  Administered 2018-07-30 – 2018-08-03 (×9): 1 via NASAL
  Filled 2018-07-30 (×3): qty 22

## 2018-07-30 MED ORDER — CHLORHEXIDINE GLUCONATE CLOTH 2 % EX PADS
6.0000 | MEDICATED_PAD | Freq: Every day | CUTANEOUS | Status: AC
  Administered 2018-07-30 – 2018-08-01 (×4): 6 via TOPICAL

## 2018-07-30 MED ORDER — CALCIUM CARBONATE ANTACID 500 MG PO CHEW
1.0000 | CHEWABLE_TABLET | Freq: Two times a day (BID) | ORAL | Status: DC | PRN
  Administered 2018-07-30 – 2018-08-01 (×3): 200 mg via ORAL
  Filled 2018-07-30 (×3): qty 1

## 2018-07-30 MED ORDER — GUAIFENESIN-DM 100-10 MG/5ML PO SYRP
5.0000 mL | ORAL_SOLUTION | ORAL | Status: DC | PRN
  Administered 2018-07-30: 21:00:00 5 mL via ORAL
  Filled 2018-07-30: qty 5

## 2018-07-30 MED ORDER — SODIUM CHLORIDE 0.9 % IV SOLN
1.0000 g | INTRAVENOUS | Status: DC
  Administered 2018-07-30 – 2018-08-02 (×4): 1 g via INTRAVENOUS
  Filled 2018-07-30 (×5): qty 1

## 2018-07-30 NOTE — Progress Notes (Signed)
I spoke with patient at bedside today about her findings.  She has had prior left foot surgery by [name] of podiatry and she would like him to take care of her new right foot and ankle problems.  I have personally discussed with [name] and he is agreeable to assuming care for her.  I will sign off at this time.  Azucena Cecil, MD [place] 11:23 AM

## 2018-07-30 NOTE — Telephone Encounter (Signed)
"  I'm trying to get a hold of [name].  I'm desperately in need of his help.  I'm supposed to have surgery tomorrow morning.  He did such a phenomenal job on my other foot.  I was really hoping he'd be able to do this surgery for me.  Both of my wounds are completely healed up but something was hiding inside my foot.  Now my ankle is surrounded by pus.  Please give me a call."  "They're actually doing surgery today.  They have transferred me to [facility].  I need your help so bad.  Thank you."

## 2018-07-30 NOTE — Progress Notes (Signed)
Inpatient Diabetes Program Recommendations  AACE/ADA: New Consensus Statement on Inpatient Glycemic Control (2015)  Target Ranges:  Prepandial:   less than 140 mg/dL      Peak postprandial:   less than 180 mg/dL (1-2 hours)      Critically ill patients:  140 - 180 mg/dL   Lab Results  Component Value Date   GLUCAP 106 (H) 07/30/2018   HGBA1C 10.4 (H) 07/30/2018    Review of Glycemic Control  Diabetes history: DM1 Outpatient Diabetes medications: insulin pump Current orders for Inpatient glycemic control: insulin pump  HgbA1C - 10.4% - hx poor glycemic control Type 1 since age 48 Blood sugars today - 135-258 mg/dL  [employer] 723 Basal rate - MN-3am - 0.5 3 am - 0.4 6 am - 0.4 11:30 am - 0.8 10 pm - 0.75  Bolus rate - CHO ratio 1:20 CF 70 during the day and 80 from 10 PM-8 AM Target 150, active insulin 4 hours  Humalog injection s/s: 151-200 - 2 units 201-250 - 4 units  251-300 - 6 units 301-350 - 8 units 351-400 - 10 units  Inpatient Diabetes Program Recommendations:     Continue with insulin pump. RN to do insulin pump assessment and have pt sign insulin pump contract.  Will follow up with pt in am.  Thank you. Lorenda Peck, RD, LDN, CDE Inpatient Diabetes Coordinator [phone]

## 2018-07-30 NOTE — Consult Note (Signed)
Reason for Consult:Suspected right foot infection Referring Physician: Cruzita Lederer MD  [name] is an 48 y.o. female.  HPI: 48 yo diabteic female who presents with a history of multiple foot infections requiring amputations bilaterally who now has swelling and erythema on her right foot where she has had a transmetatarsal amputation.  She said that this was done in [place] at the [facility] previously. The right foot has been getting worse and she presented to [facility] ED where she ws evaluated with an MRI which showed osteo of the midfoot and ascending tenosynovitis of the tibialis anterior.  Past Medical History:  Diagnosis Date  . Anemia   . Arthritis   . Bronchitis   . C. difficile diarrhea 09/26/2014  . Cataracts, both eyes   . Cellulitis of right foot 06/02/2014  . CKD (chronic kidney disease) stage 3, GFR 30-59 ml/min (HCC)    sees Dr Justin Mend- Stage IV  . Diabetic ulcer of right foot ([place]) 06/02/2014  . DVT (deep venous thrombosis) ([place]) 10/2014   one in each one in leg- PICC line , one in right and left arm  . H/O seasonal allergies   . Heart murmur    told once when she was pregnant- no furter mention- was in notes 11/2015- had Echo 8 /4/17  . History of blood transfusion   . Hypercholesteremia   . Hypertension   . Left foot infection   . Neuropathy    feet  . Neuropathy in diabetes ([place])   . Peripheral vascular disease ([place])   . Sleep apnea    not able to use CPAP  . Staphylococcus aureus bacteremia 10/2014  . Type 1 diabetes ([place])    onset age 37    Past Surgical History:  Procedure Laterality Date  . AMPUTATION TOE Left 07/24/2015   5th toe and Hallux amputation  . CESAREAN SECTION    . EXOSTECTECTOMY TOE Left 04/11/2016   Procedure: EXOSTECTECTOMY CUBOID AND 5TH METATARSAL AND PLACEMENT OF ANTIBIOTIC BEADS;  Surgeon: Edrick Kins, DPM;  Location: [place];  Service: Podiatry;  Laterality: Left;  . EYE SURGERY Bilateral    [place]  . FEMORAL-POPLITEAL  BYPASS GRAFT Left 05/03/2015  . FOOT AMPUTATION THROUGH METATARSAL Right 2016  . hemrrhoidectomy    . I&D EXTREMITY Right 06/10/2014   Procedure: IRRIGATION AND DEBRIDEMENT Right Foot;  Surgeon: Newt Minion, MD;  Location: [facility] [facility];  Service: Orthopedics;  Laterality: Right;  . INCISION AND DRAINAGE Left 04/11/2016   Procedure: INCISION AND DRAINAGE A DEEP COMPLICATED WOUND OF LEFT FOOT;  Surgeon: Edrick Kins, DPM;  Location: [place];  Service: Podiatry;  Laterality: Left;  . IR FLUORO GUIDE CV LINE RIGHT  09/24/2017  . IR REMOVAL TUN CV CATH W/O FL  11/22/2017  . IR US GUIDE VASC ACCESS RIGHT  09/24/2017  . IRRIGATION AND DEBRIDEMENT ABSCESS Left 05/06/2017   Procedure: IRRIGATION AND DEBRIDEMENT ABSCESS LEFT FOOT;  Surgeon: Edrick Kins, DPM;  Location[facility];  Service: Podiatry;  Laterality: Left;  . PERIPHERALLY INSERTED CENTRAL CATHETER INSERTION    . SKIN GRAFT Right 06/15/2014  . SKIN GRAFT Left 06/2015   foot   . SKIN SPLIT GRAFT Right 06/15/2014   Procedure: SPLIT THICKNESS SKIN GRAFT RIGHT FOOT;  Surgeon: Newt Minion, MD;  Location: [facility];  Service: Orthopedics;  Laterality: Right;  . TRANSMETATARSAL AMPUTATION Right   . TRANSMETATARSAL AMPUTATION Right 09/11/2014    Family History  Problem Relation Age of  Onset  . Hyperlipidemia Mother   . Dementia Mother     Social History:  reports that she quit smoking about 10 years ago. She quit after 15.00 years of use. She has never used smokeless tobacco. She reports current alcohol use. She reports that she does not use drugs.  Allergies:  Allergies  Allergen Reactions  . Cleocin [Clindamycin Hcl] Diarrhea  . Lisinopril Other (See Comments)    Elevated potassium per pt report  . Amoxicillin Diarrhea, Nausea Only and Other (See Comments)    Has patient had a PCN reaction causing immediate rash, facial/tongue/throat swelling, SOB or lightheadedness with hypotension: No Has patient had a PCN reaction causing severe rash involving  mucus membranes or skin necrosis: No Has patient had a PCN reaction that required hospitalization: No Has patient had a PCN reaction occurring within the last 10 years: No If all of the above answers are "NO", then may proceed with Cephalosporin use.   . Bactrim [Sulfamethoxazole-Trimethoprim] Diarrhea and Nausea Only    Medications: I have reviewed the patient's current medications.  Results for orders placed or performed during the hospital encounter of 07/29/18 (from the past 48 hour(s))  C-reactive protein     Status: Abnormal   Collection Time: 07/29/18  3:24 PM  Result Value Ref Range   CRP 29.0 (H) <1.0 mg/dL    Comment: Performed at [facility], [place] [address], [place], [place] [address]  Urinalysis, Routine w reflex microscopic     Status: Abnormal   Collection Time: 07/29/18  3:24 PM  Result Value Ref Range   Color, Urine YELLOW YELLOW   APPearance HAZY (A) CLEAR   Specific Gravity, Urine 1.010 1.005 - 1.030   pH 5.0 5.0 - 8.0   Glucose, UA >=500 (A) NEGATIVE mg/dL   Hgb urine dipstick SMALL (A) NEGATIVE   Bilirubin Urine NEGATIVE NEGATIVE   Ketones, ur NEGATIVE NEGATIVE mg/dL   Protein, ur >=300 (A) NEGATIVE mg/dL   Nitrite POSITIVE (A) NEGATIVE   Leukocytes,Ua SMALL (A) NEGATIVE   RBC / HPF 0-5 0 - 5 RBC/hpf   WBC, UA 21-50 0 - 5 WBC/hpf   Bacteria, UA MANY (A) NONE SEEN   Squamous Epithelial / LPF 0-5 0 - 5    Comment: Performed at [facility], [place] [address] [place], [place] [address]  Sedimentation rate     Status: Abnormal   Collection Time: 07/29/18  3:57 PM  Result Value Ref Range   Sed Rate 107 (H) 0 - 22 mm/hr    Comment: Performed at [facility], [place] [name] [place], [place] [address]  Lactic acid, plasma     Status: None   Collection Time: 07/29/18  3:57 PM  Result Value Ref Range   Lactic Acid, Venous 1.6 0.5 - 1.9 mmol/L    Comment: Performed at [facility], [place]  [address], [place], [place] [address]  CBC with Differential     Status: Abnormal   Collection Time: 07/29/18  3:57 PM  Result Value Ref Range   WBC 24.7 (H) 4.0 - 10.5 K/uL   RBC 3.04 (L) 3.87 - 5.11 MIL/uL   Hemoglobin 8.5 (L) 12.0 - 15.0 g/dL   HCT 26.8 (L) 36.0 - 46.0 %   MCV 88.2 80.0 - 100.0 fL   MCH 28.0 26.0 - 34.0 pg   MCHC 31.7 30.0 - 36.0 g/dL   RDW 14.8 11.5 - 15.5 %   Platelets 528 (H) 150 -  400 K/uL   nRBC 0.1 0.0 - 0.2 %   Neutrophils Relative % 92 %   Neutro Abs 22.8 (H) 1.7 - 7.7 K/uL   Lymphocytes Relative 2 %   Lymphs Abs 0.5 (L) 0.7 - 4.0 K/uL   Monocytes Relative 5 %   Monocytes Absolute 1.1 (H) 0.1 - 1.0 K/uL   Eosinophils Relative 0 %   Eosinophils Absolute 0.0 0.0 - 0.5 K/uL   Basophils Relative 0 %   Basophils Absolute 0.0 0.0 - 0.1 K/uL   Immature Granulocytes 1 %   Abs Immature Granulocytes 0.21 (H) 0.00 - 0.07 K/uL    Comment: Performed at [facility], [place] [address] [place], [place] [address]  SARS Coronavirus 2 [facility] order, Performed in [facility] hospital lab)     Status: None   Collection Time: 07/29/18  3:57 PM  Result Value Ref Range   SARS Coronavirus 2 NEGATIVE NEGATIVE    Comment: (NOTE) If result is NEGATIVE SARS-CoV-2 target nucleic acids are NOT DETECTED. The SARS-CoV-2 RNA is generally detectable in upper and lower  respiratory specimens during the acute phase of infection. The lowest  concentration of SARS-CoV-2 viral copies this assay can detect is 250  copies / mL. A negative result does not preclude SARS-CoV-2 infection  and should not be used as the sole basis for treatment or other  patient management decisions.  A negative result may occur with  improper specimen collection / handling, submission of specimen other  than nasopharyngeal swab, presence of viral mutation(s) within the  areas targeted by this assay, and inadequate number of viral copies  (<250 copies / mL). A negative result must be combined  with clinical  observations, patient history, and epidemiological information. If result is POSITIVE SARS-CoV-2 target nucleic acids are DETECTED. The SARS-CoV-2 RNA is generally detectable in upper and lower  respiratory specimens dur ing the acute phase of infection.  Positive  results are indicative of active infection with SARS-CoV-2.  Clinical  correlation with patient history and other diagnostic information is  necessary to determine patient infection status.  Positive results do  not rule out bacterial infection or co-infection with other viruses. If result is PRESUMPTIVE POSTIVE SARS-CoV-2 nucleic acids MAY BE PRESENT.   A presumptive positive result was obtained on the submitted specimen  and confirmed on repeat testing.  While 2019 novel coronavirus  (SARS-CoV-2) nucleic acids may be present in the submitted sample  additional confirmatory testing may be necessary for epidemiological  and / or clinical management purposes  to differentiate between  SARS-CoV-2 and other Sarbecovirus currently known to infect humans.  If clinically indicated additional testing with an alternate test  methodology [phone]) is advised. The SARS-CoV-2 RNA is generally  detectable in upper and lower respiratory sp ecimens during the acute  phase of infection. The expected result is Negative. Fact Sheet for Patients:  StrictlyIdeas.no Fact Sheet for Healthcare Providers: BankingDealers.co.za This test is not yet approved or cleared by the [place] FDA and has been authorized for detection and/or diagnosis of SARS-CoV-2 by FDA under an Emergency Use Authorization (EUA).  This EUA will remain in effect (meaning this test can be used) for the duration of the COVID-19 declaration under Section 564(b)(1) of the Act, 21 U.S.C. section 360bbb-3(b)(1), unless the authorization is terminated or revoked sooner. Performed at [facility], [place] [address] [place], [place] [address]   Comprehensive metabolic panel     Status: Abnormal   Collection  Time: 07/29/18  6:02 PM  Result Value Ref Range   Sodium 133 (L) 135 - 145 mmol/L   Potassium 4.5 3.5 - 5.1 mmol/L   Chloride 113 (H) 98 - 111 mmol/L   CO2 <7 (L) 22 - 32 mmol/L    Comment: REPEATED TO VERIFY   Glucose, Bld 61 (L) 70 - 99 mg/dL   BUN 101 (H) 6 - 20 mg/dL   Creatinine, Ser 7.00 (H) 0.44 - 1.00 mg/dL   Calcium 6.2 (LL) 8.9 - 10.3 mg/dL    Comment: CRITICAL RESULT CALLED TO, READ BACK BY AND VERIFIED WITH: RN WRIGHT V AT 2123 07/29/18 CRUICKSHANK A    Total Protein 6.3 (L) 6.5 - 8.1 g/dL   Albumin 2.1 (L) 3.5 - 5.0 g/dL   AST 16 15 - 41 U/L   ALT 18 0 - 44 U/L   Alkaline Phosphatase 249 (H) 38 - 126 U/L   Total Bilirubin 0.4 0.3 - 1.2 mg/dL   GFR calc non Af Amer 6 (L) >60 mL/min   GFR calc Af Amer 7 (L) >60 mL/min   Anion gap 14.0 5 - 15    Comment: Performed at [facility], [place] [address], [place], [place] [address]  Glucose, capillary     Status: Abnormal   Collection Time: 07/30/18  1:00 AM  Result Value Ref Range   Glucose-Capillary 145 (H) 70 - 99 mg/dL  TSH     Status: None   Collection Time: 07/30/18  1:04 AM  Result Value Ref Range   TSH 2.021 0.350 - 4.500 uIU/mL    Comment: Performed by a 3rd Generation assay with a functional sensitivity of <=0.01 uIU/mL. Performed at [facility], [place] [address], [place], [place] [address]   APTT     Status: Abnormal   Collection Time: 07/30/18  1:04 AM  Result Value Ref Range   aPTT 42 (H) 24 - 36 seconds    Comment:        IF BASELINE aPTT IS ELEVATED, SUGGEST PATIENT RISK ASSESSMENT BE USED TO DETERMINE APPROPRIATE ANTICOAGULANT THERAPY. Performed at [facility], [place] [address] [place], [place] [address]   Protime-INR     Status: Abnormal   Collection Time: 07/30/18  1:04 AM  Result Value Ref Range   Prothrombin Time 17.2 (H) 11.4 - 15.2 seconds    INR 1.4 (H) 0.8 - 1.2    Comment: (NOTE) INR goal varies based on device and disease states. Performed at [facility], [place] [address] [place], [place] [address]   Basic metabolic panel     Status: Abnormal   Collection Time: 07/30/18  1:04 AM  Result Value Ref Range   Sodium 135 135 - 145 mmol/L   Potassium 4.5 3.5 - 5.1 mmol/L   Chloride 109 98 - 111 mmol/L   CO2 7 (L) 22 - 32 mmol/L   Glucose, Bld 147 (H) 70 - 99 mg/dL   BUN 102 (H) 6 - 20 mg/dL   Creatinine, Ser 7.23 (H) 0.44 - 1.00 mg/dL   Calcium 6.8 (L) 8.9 - 10.3 mg/dL   GFR calc non Af Amer 6 (L) >60 mL/min   GFR calc Af Amer 7 (L) >60 mL/min   Anion gap 19 (H) 5 - 15    Comment: Performed at [place] [facility] [address] [place], [place] [address]  CBC     Status: Abnormal   Collection Time: 07/30/18  1:04 AM  Result Value Ref Range   WBC 27.0 (H) 4.0 - 10.5 K/uL   RBC 2.93 (L) 3.87 - 5.11 MIL/uL   Hemoglobin 8.2 (L) 12.0 - 15.0 g/dL   HCT 25.3 (L) 36.0 - 46.0 %   MCV 86.3 80.0 - 100.0 fL   MCH 28.0 26.0 - 34.0 pg   MCHC 32.4 30.0 - 36.0 g/dL   RDW 14.3 11.5 - 15.5 %   Platelets 597 (H) 150 - 400 K/uL   nRBC 0.0 0.0 - 0.2 %    Comment: Performed at [facility] [address], [place], [place] [address]  Magnesium     Status: Abnormal   Collection Time: 07/30/18  1:04 AM  Result Value Ref Range   Magnesium 1.3 (L) 1.7 - 2.4 mg/dL    Comment: Performed at [facility] [address], [place], [place] [address]  Phosphorus     Status: Abnormal   Collection Time: 07/30/18  1:04 AM  Result Value Ref Range   Phosphorus 5.9 (H) 2.5 - 4.6 mg/dL    Comment: Performed at [facility] [address] [place], [place] [address]  Iron and TIBC     Status: Abnormal   Collection Time: 07/30/18  1:04 AM  Result Value Ref Range   Iron 7 (L) 28 - 170 ug/dL   TIBC 209 (L) 250 - 450 ug/dL   Saturation Ratios 3 (L) 10.4 - 31.8 %   UIBC 202 ug/dL    Comment: Performed at [facility] [place], [place] [address] [place], [place] [address]  Ferritin     Status: None   Collection Time: 07/30/18  1:04 AM  Result Value Ref Range   Ferritin 232 11 - 307 ng/mL    Comment: Performed at [facility] [address] [place], [place] [address]  Vitamin B12     Status: None   Collection Time: 07/30/18  1:04 AM  Result Value Ref Range   Vitamin B-12 788 180 - 914 pg/mL    Comment: (NOTE) This assay is not validated for testing neonatal or myeloproliferative syndrome specimens for Vitamin B12 levels. Performed at [facility], [place] [address], [place], [place] [address]   Hemoglobin A1c     Status: Abnormal   Collection Time: 07/30/18  1:10 AM  Result Value Ref Range   Hgb A1c MFr Bld 10.4 (H) 4.8 - 5.6 %    Comment: (NOTE) Pre diabetes:          5.7%-6.4% Diabetes:              >6.4% Glycemic control for   <7.0% adults with diabetes    Mean Plasma Glucose 251.78 mg/dL    Comment: Performed at [facility] [address], [place], [place] [address]  Glucose, capillary     Status: Abnormal   Collection Time: 07/30/18  5:16 AM  Result Value Ref Range   Glucose-Capillary 135 (H) 70 - 99 mg/dL    US Renal  Result Date: 07/30/2018 CLINICAL DATA:  Initial evaluation for acute renal injury. History of diabetes. EXAM: RENAL / URINARY TRACT ULTRASOUND COMPLETE COMPARISON:  Prior CT from 02/01/2014 FINDINGS: Right Kidney: Renal measurements: 9.0 x 4.4 x 4.6 cm = volume: 94.9 mL. Increased echogenicity within the renal parenchyma. No mass or hydronephrosis visualized. Left Kidney: Renal measurements: 8.3 x 4.5 x 4.7 cm = volume: 93.9 mL. Increased echogenicity within the renal parenchyma. No mass or  hydronephrosis visualized. Bladder: Mild circumferential bladder wall thickening, likely related incomplete distension. IMPRESSION: 1. Increased echogenicity within the renal parenchyma, compatible with medical renal disease. 2. No hydronephrosis. Electronically Signed   By:  Jeannine Boga M.D.   On: 07/30/2018 01:37   Mr Ankle Right Wo Contrast  Result Date: 07/29/2018 CLINICAL DATA:  Pain and swelling of the right ankle. Previous amputation of the foot at the level of the bases of the metatarsals. EXAM: MRI OF THE RIGHT ANKLE WITHOUT CONTRAST TECHNIQUE: Multiplanar, multisequence MR imaging of the ankle was performed. No intravenous contrast was administered. COMPARISON:  MRI dated 05/05/2018 and radiographs dated 08/06/2017 FINDINGS: TENDONS Peroneal: There is a tiny amount of fluid in the peroneus longus tendon sheath, unchanged since the prior study. Posteromedial: There is increased fluid in the sheath of the posterior tibialis tendon just distal to the tip of the medial malleolus but this is minimal. Anterior: There is tenosynovitis involving the stumps of the tibialis anterior tendon and extensor hallucis longus tendon. The fluid, likely pus, extends from the stumps of the tendons into the first tarsal metatarsal joint and around the base of the first metatarsal. Achilles: Normal. Plantar Fascia: Normal. LIGAMENTS Lateral: Intact. Medial: Intact. CARTILAGE Ankle Joint: No significant abnormality. Subtalar Joints/Sinus Tarsi: Progressive arthritis of the posterior facets of the subtalar joint. Ligaments of the sinus tarsi are intact. Bones: The patient has developed patchy mottled signal abnormalities in bases of the first and second metatarsal stumps and to a lesser degree in the bases of the third, fourth and fifth metatarsal stumps. There is also new patchy signal abnormality in the medial and middle cuneiforms as well as in the navicular and to a lesser degree in the lateral cuneiform and cuboid. Other: Diffuse subcutaneous edema around the ankle and in the foot. IMPRESSION: 1. Findings consistent with infectious tenosynovitis of the anterior tibialis tendon and extensor hallucis longus tendon with pus extending into the first tarsal metatarsal joint. Probable  osteomyelitis involving the medial cuneiform and bases of the first and second metatarsals. I suspect there is less severe osteomyelitis involving the other metatarsal bases, the middle and lateral cuneiforms, and the navicular and possibly the cuboid. Electronically Signed   By: Lorriane Shire M.D.   On: 07/29/2018 20:27   Dg Chest Portable 1 View  Result Date: 07/29/2018 CLINICAL DATA:  Fever for 5 days, nonproductive cough, diarrhea, nausea EXAM: PORTABLE CHEST 1 VIEW COMPARISON:  Portable exam 1518 hours compared to 05/04/2017 FINDINGS: Normal heart size, mediastinal contours, and pulmonary vascularity. Peribronchial thickening, new. No acute infiltrate, pleural effusion, or pneumothorax. Bones appear demineralized. IMPRESSION: Bronchitic changes without infiltrate. Electronically Signed   By: Lavonia Dana M.D.   On: 07/29/2018 16:15    ROS Blood pressure 130/71, pulse 98, temperature (!) 100.8 F (38.2 C), resp. rate (!) 24, height 5\' 8"  (1.727 m), weight 57.7 kg, last menstrual period 06/28/2018, SpO2 100 %. Physical Exam  Healthy appearing female sitting up in bed in no apparent distress. AAO Bilateral UEs with pain free AROM. Right LE, moderate swelling below the knee with some erythema anteriorly. Pulses are faint. Midfoot amputation well healed with no drainage. Insensate below the ankle. Left foot with evidence of lateral ray amputations of the 4th and 5th toes.   Assessment/Plan: 48 yo diabetic female who presents with osteo and tenosynovitis of her right LE in the setting of prior transmet amputation. I discussed with her that she will likely require further surgery to eradicate the infection and  she expresses an understanding of this.  I have spoken with Dr Erlinda Hong who has seen her previously and has agreed to see her for further ortho management.  Continue broad spectum IV antibiotics Medical work-up including renal US continuing  Augustin Schooling 07/30/2018, 7:28 AM

## 2018-07-30 NOTE — Progress Notes (Addendum)
Received to room 5W15 from [name] via EMS at 2340. Assisted to bed and positioned for comfort. Oriented to room, bed and unit.

## 2018-07-30 NOTE — Progress Notes (Signed)
Pharmacy Antibiotic Note  [name] is a 48 y.o. female admitted on 07/29/2018 with osteomyelitis.  Pharmacy initially consulted for cefepime and vancomycin dosing. Now to change cefepime to meropenem for h/o ESBL infection.  Noted pt in acute renal failure.  Plan: Meropenem 1gm IV q24h Vancomycin dosing based on renal function F/u cultures, SCr, UOP, and pt's clinical condition Vanc levels prn  Height: 5\' 8"  (172.7 cm) Weight: 127 lb 3.3 oz (57.7 kg) IBW/kg (Calculated) : 63.9  Temp (24hrs), Avg:98.9 F (37.2 C), Min:97.7 F (36.5 C), Max:100.8 F (38.2 C)  Recent Labs  Lab 07/29/18 1557 07/29/18 1802 07/30/18 0104  WBC 24.7*  --  27.0*  CREATININE  --  7.00* 7.23*  LATICACIDVEN 1.6  --   --     Estimated Creatinine Clearance: 8.9 mL/min (A) (by C-G formula based on SCr of 7.23 mg/dL (H)).    Allergies  Allergen Reactions  . Cleocin [Clindamycin Hcl] Diarrhea  . Lisinopril Other (See Comments)    Elevated potassium per pt report  . Amoxicillin Diarrhea, Nausea Only and Other (See Comments)    Has patient had a PCN reaction causing immediate rash, facial/tongue/throat swelling, SOB or lightheadedness with hypotension: No Has patient had a PCN reaction causing severe rash involving mucus membranes or skin necrosis: No Has patient had a PCN reaction that required hospitalization: No Has patient had a PCN reaction occurring within the last 10 years: No If all of the above answers are "NO", then may proceed with Cephalosporin use.   . Bactrim [Sulfamethoxazole-Trimethoprim] Diarrhea and Nausea Only    Antimicrobials this admission: 4/28 cefepime x1 4/29 meropenem>>  4/28 vancomycin >>   Microbiology results: 4/28 Covid negative 4/28 BCx:  Cdiff PCR:   Thank you for allowing pharmacy to be a part of this patient's care.  Sherlon Handing, PharmD, BCPS Clinical pharmacist  **Pharmacist phone directory can now be found on [email or web address] ([email or web address]).  Listed under [place]. 07/30/2018 6:54 AM

## 2018-07-30 NOTE — Progress Notes (Signed)
PROGRESS NOTE  [name] ESP:[id number] DOB: 12/05/1970 DOA: 07/29/2018 PCP: Loyola Mast, PA-C   LOS: 1 day   Brief Narrative / Interim history: 48 year old female with history of poorly controlled diabetes, multiple diabetic foot wants requiring multiple amputations, hypertension, hyperlipidemia who came into wound care on 4/28 with worsening right foot pain as well as fever and chills.  She was suspected to have an infection about a week ago and was placed on outpatient doxycycline but despite that her right foot continued to get worse.  She underwent an MRI in the ED which showed tenosynovitis/osteomyelitis, orthopedic surgery was consulted and patient was admitted to the hospital.  Subjective: He is doing well this morning, complains of chills, does complain of right foot pain but appreciates a little bit improvement compared to yesterday.  No abdominal pain, no nausea or vomiting  Assessment & Plan: Active Problems:   Osteomyelitis of right foot (HCC)  Principal Problem Sepsis due to tenosynovitis and osteomyelitis of the right lower foot -Orthopedic surgery consulted, discussed with Dr. Erlinda Hong, patient would prefer to have her care by [name] with podiatry, orthopedic surgery discussed directly with podiatry and they will assume care.  We will go ahead and let her eat today, make n.p.o. after midnight -Patient was placed on vancomycin and cefepime, given history of ESBL infection in that wound will change to meropenem.  Remains with significant leukocytosis white count 20 7K and remains febrile  Active Problems Acute kidney injury on chronic kidney disease stage IV /metabolic acidosis -Baseline creatinine about 3.5, patient is seeing Dr. Justin Mend in office but has not seen him in a long time.  Creatinine is in the 7 range and she is acidotic with a bicarb of 7.  Patient was placed on bicarb infusion, continue.  If does not respond we will consult nephrology -Ultrasound without  hydronephrosis  Chronic cough -Antibiotics as above, she was checked for Covid which was negative -Symptomatic management  Poorly controlled diabetes -Continue her home insulin pump,   Chronic anemia of renal disease -No signs of bleeding, monitor hemoglobin  Hypertension -Continue home amlodipine, hold Lasix  Hyperlipidemia -Continue statin   Scheduled Meds: . amLODipine  5 mg Oral QHS  . docusate sodium  100 mg Oral BID  . heparin  5,000 Units Subcutaneous Q8H  . insulin pump   Subcutaneous TID AC, HS, 0200  . loratadine  10 mg Oral QHS  . pravastatin  40 mg Oral Daily  . senna  1 tablet Oral BID  . sodium chloride flush  3 mL Intravenous Q12H  . vancomycin variable dose per unstable renal function (pharmacist dosing)   Does not apply See admin instructions   Continuous Infusions: . meropenem (MERREM) IV 1 g (07/30/18 0910)  .  sodium bicarbonate  infusion 1000 mL 100 mL/hr at 07/30/18 0946   PRN Meds:.acetaminophen **OR** acetaminophen, albuterol, benzonatate, oxyCODONE  DVT prophylaxis: heparin Code Status: Full code Family Communication: no family at bedside  Disposition Plan: home when ready   Consultants:   Podiatry   Procedures:   None   Antimicrobials:  Vancomycin / Meropenem 4/29 >>   Objective: Vitals:   07/30/18 0000 07/30/18 0003 07/30/18 0528 07/30/18 0828  BP: 130/71     Pulse: 98     Resp: (!) 24     Temp:  97.7 F (36.5 C) (!) 100.8 F (38.2 C) 98.9 F (37.2 C)  TempSrc:    Oral  SpO2: 100%     Weight:  Height:        Intake/Output Summary (Last 24 hours) at 07/30/2018 1122 Last data filed at 07/29/2018 1910 Gross per 24 hour  Intake 100 ml  Output -  Net 100 ml   Filed Weights   07/29/18 1456 07/29/18 2357  Weight: 56.7 kg 57.7 kg    Examination:  Constitutional: NAD Eyes: PERRL, lids and conjunctivae normal ENMT: Mucous membranes are moist. Respiratory: clear to auscultation bilaterally, no wheezing, no  crackles. Normal respiratory effort.  Cardiovascular: Regular rate and rhythm, no murmurs / rubs / gallops. No LE edema. 2+ pedal pulses.  Abdomen: no tenderness. Bowel sounds positive.  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 Strength 5/5 in all 4.  Psychiatric: Normal judgment and insight. Alert and oriented x 3. Normal mood.    Data Reviewed: I have independently reviewed following labs and imaging studies   CBC: Recent Labs  Lab 07/29/18 1557 07/30/18 0104  WBC 24.7* 27.0*  NEUTROABS 22.8*  --   HGB 8.5* 8.2*  HCT 26.8* 25.3*  MCV 88.2 86.3  PLT 528* 161*   Basic Metabolic Panel: Recent Labs  Lab 07/29/18 1802 07/30/18 0104  NA 133* 135  K 4.5 4.5  CL 113* 109  CO2 <7* 7*  GLUCOSE 61* 147*  BUN 101* 102*  CREATININE 7.00* 7.23*  CALCIUM 6.2* 6.8*  MG  --  1.3*  PHOS  --  5.9*   GFR: Estimated Creatinine Clearance: 8.9 mL/min (A) (by C-G formula based on SCr of 7.23 mg/dL (H)). Liver Function Tests: Recent Labs  Lab 07/29/18 1802  AST 16  ALT 18  ALKPHOS 249*  BILITOT 0.4  PROT 6.3*  ALBUMIN 2.1*   No results for input(s): LIPASE, AMYLASE in the last 168 hours. No results for input(s): AMMONIA in the last 168 hours. Coagulation Profile: Recent Labs  Lab 07/30/18 0104  INR 1.4*   Cardiac Enzymes: No results for input(s): CKTOTAL, CKMB, CKMBINDEX, TROPONINI in the last 168 hours. BNP (last 3 results) No results for input(s): PROBNP in the last 8760 hours. HbA1C: Recent Labs    07/30/18 0110  HGBA1C 10.4*   CBG: Recent Labs  Lab 07/30/18 0100 07/30/18 0516 07/30/18 0826 07/30/18 0935  GLUCAP 145* 135* 228* 237*   Lipid Profile: No results for input(s): CHOL, HDL, LDLCALC, TRIG, CHOLHDL, LDLDIRECT in the last 72 hours. Thyroid Function Tests: Recent Labs    07/30/18 0104  TSH 2.021   Anemia Panel: Recent Labs    07/30/18 0104  VITAMINB12 788  FERRITIN  232  TIBC 209*  IRON 7*   Urine analysis:    Component Value Date/Time   COLORURINE YELLOW 07/29/2018 1524   APPEARANCEUR HAZY (A) 07/29/2018 1524   LABSPEC 1.010 07/29/2018 1524   PHURINE 5.0 07/29/2018 1524   GLUCOSEU >=500 (A) 07/29/2018 1524   GLUCOSEU 500 (A) 01/26/2015 1200   HGBUR SMALL (A) 07/29/2018 1524   BILIRUBINUR NEGATIVE 07/29/2018 [place] 07/29/2018 1524   PROTEINUR >=300 (A) 07/29/2018 1524   UROBILINOGEN 0.2 01/26/2015 1200   NITRITE POSITIVE (A) 07/29/2018 1524   LEUKOCYTESUR SMALL (A) 07/29/2018 1524   Sepsis Labs: Invalid input(s): PROCALCITONIN, LACTICIDVEN  Recent Results (from the past 240 hour(s))  SARS Coronavirus 2 [facility] order, Performed in [place] hospital lab)     Status: None   Collection Time: 07/29/18  3:57 PM  Result Value Ref Range Status   SARS Coronavirus  2 NEGATIVE NEGATIVE Final    Comment: (NOTE) If result is NEGATIVE SARS-CoV-2 target nucleic acids are NOT DETECTED. The SARS-CoV-2 RNA is generally detectable in upper and lower  respiratory specimens during the acute phase of infection. The lowest  concentration of SARS-CoV-2 viral copies this assay can detect is 250  copies / mL. A negative result does not preclude SARS-CoV-2 infection  and should not be used as the sole basis for treatment or other  patient management decisions.  A negative result may occur with  improper specimen collection / handling, submission of specimen other  than nasopharyngeal swab, presence of viral mutation(s) within the  areas targeted by this assay, and inadequate number of viral copies  (<250 copies / mL). A negative result must be combined with clinical  observations, patient history, and epidemiological information. If result is POSITIVE SARS-CoV-2 target nucleic acids are DETECTED. The SARS-CoV-2 RNA is generally detectable in upper and lower  respiratory specimens dur ing the acute phase of infection.  Positive   results are indicative of active infection with SARS-CoV-2.  Clinical  correlation with patient history and other diagnostic information is  necessary to determine patient infection status.  Positive results do  not rule out bacterial infection or co-infection with other viruses. If result is PRESUMPTIVE POSTIVE SARS-CoV-2 nucleic acids MAY BE PRESENT.   A presumptive positive result was obtained on the submitted specimen  and confirmed on repeat testing.  While 2019 novel coronavirus  (SARS-CoV-2) nucleic acids may be present in the submitted sample  additional confirmatory testing may be necessary for epidemiological  and / or clinical management purposes  to differentiate between  SARS-CoV-2 and other Sarbecovirus currently known to infect humans.  If clinically indicated additional testing with an alternate test  methodology [phone]) is advised. The SARS-CoV-2 RNA is generally  detectable in upper and lower respiratory sp ecimens during the acute  phase of infection. The expected result is Negative. Fact Sheet for Patients:  StrictlyIdeas.no Fact Sheet for Healthcare Providers: BankingDealers.co.za This test is not yet approved or cleared by the [place] FDA and has been authorized for detection and/or diagnosis of SARS-CoV-2 by FDA under an Emergency Use Authorization (EUA).  This EUA will remain in effect (meaning this test can be used) for the duration of the COVID-19 declaration under Section 564(b)(1) of the Act, 21 U.S.C. section 360bbb-3(b)(1), unless the authorization is terminated or revoked sooner. Performed at [facility], [place] [address], [place], [place] [address]   MRSA PCR Screening     Status: Abnormal   Collection Time: 07/30/18  8:28 AM  Result Value Ref Range Status   MRSA by PCR POSITIVE (A) NEGATIVE Final    Comment:        The GeneXpert MRSA Assay (FDA approved for NASAL specimens  only), is one component of a comprehensive MRSA colonization surveillance program. It is not intended to diagnose MRSA infection nor to guide or monitor treatment for MRSA infections. RESULT CALLED TO, READ BACK BY AND VERIFIED WITH: A. Lovena Le RN 11:15 07/30/18 ([email or web address]) Performed at [facility], [place] [address] [place], [place] [address]       Radiology Studies: US Renal  Result Date: 07/30/2018 CLINICAL DATA:  Initial evaluation for acute renal injury. History of diabetes. EXAM: RENAL / URINARY TRACT ULTRASOUND COMPLETE COMPARISON:  Prior CT from 02/01/2014 FINDINGS: Right Kidney: Renal measurements: 9.0 x 4.4 x 4.6 cm = volume: 94.9 mL. Increased echogenicity within the renal parenchyma. No  mass or hydronephrosis visualized. Left Kidney: Renal measurements: 8.3 x 4.5 x 4.7 cm = volume: 93.9 mL. Increased echogenicity within the renal parenchyma. No mass or hydronephrosis visualized. Bladder: Mild circumferential bladder wall thickening, likely related incomplete distension. IMPRESSION: 1. Increased echogenicity within the renal parenchyma, compatible with medical renal disease. 2. No hydronephrosis. Electronically Signed   By: Jeannine Boga M.D.   On: 07/30/2018 01:37   Mr Ankle Right Wo Contrast  Result Date: 07/29/2018 CLINICAL DATA:  Pain and swelling of the right ankle. Previous amputation of the foot at the level of the bases of the metatarsals. EXAM: MRI OF THE RIGHT ANKLE WITHOUT CONTRAST TECHNIQUE: Multiplanar, multisequence MR imaging of the ankle was performed. No intravenous contrast was administered. COMPARISON:  MRI dated 05/05/2018 and radiographs dated 08/06/2017 FINDINGS: TENDONS Peroneal: There is a tiny amount of fluid in the peroneus longus tendon sheath, unchanged since the prior study. Posteromedial: There is increased fluid in the sheath of the posterior tibialis tendon just distal to the tip of the medial malleolus but this is minimal. Anterior: There is  tenosynovitis involving the stumps of the tibialis anterior tendon and extensor hallucis longus tendon. The fluid, likely pus, extends from the stumps of the tendons into the first tarsal metatarsal joint and around the base of the first metatarsal. Achilles: Normal. Plantar Fascia: Normal. LIGAMENTS Lateral: Intact. Medial: Intact. CARTILAGE Ankle Joint: No significant abnormality. Subtalar Joints/Sinus Tarsi: Progressive arthritis of the posterior facets of the subtalar joint. Ligaments of the sinus tarsi are intact. Bones: The patient has developed patchy mottled signal abnormalities in bases of the first and second metatarsal stumps and to a lesser degree in the bases of the third, fourth and fifth metatarsal stumps. There is also new patchy signal abnormality in the medial and middle cuneiforms as well as in the navicular and to a lesser degree in the lateral cuneiform and cuboid. Other: Diffuse subcutaneous edema around the ankle and in the foot. IMPRESSION: 1. Findings consistent with infectious tenosynovitis of the anterior tibialis tendon and extensor hallucis longus tendon with pus extending into the first tarsal metatarsal joint. Probable osteomyelitis involving the medial cuneiform and bases of the first and second metatarsals. I suspect there is less severe osteomyelitis involving the other metatarsal bases, the middle and lateral cuneiforms, and the navicular and possibly the cuboid. Electronically Signed   By: Lorriane Shire M.D.   On: 07/29/2018 20:27   Dg Chest Portable 1 View  Result Date: 07/29/2018 CLINICAL DATA:  Fever for 5 days, nonproductive cough, diarrhea, nausea EXAM: PORTABLE CHEST 1 VIEW COMPARISON:  Portable exam 1518 hours compared to 05/04/2017 FINDINGS: Normal heart size, mediastinal contours, and pulmonary vascularity. Peribronchial thickening, new. No acute infiltrate, pleural effusion, or pneumothorax. Bones appear demineralized. IMPRESSION: Bronchitic changes without  infiltrate. Electronically Signed   By: Lavonia Dana M.D.   On: 07/29/2018 16:15   Dg Foot Complete Right  Result Date: 07/30/2018 CLINICAL DATA:  Osteomyelitis.  Right foot infection. EXAM: RIGHT FOOT COMPLETE - 3+ VIEW COMPARISON:  None. FINDINGS: The patient has had forefoot amputation at the level of the bases of the metatarsals. There is a small area of the erosion of the cortex of stump of the fifth metatarsal seen only on the lateral view. There is soft tissue swelling of the stump and on the dorsum of the foot and anterior aspect of the ankle. No fractures. IMPRESSION: Possible focal osteomyelitis of the stump of the fifth metatarsal. Soft tissue swelling. Electronically Signed  By: Lorriane Shire M.D.   On: 07/30/2018 08:47    Marzetta Board, MD, PhD [facility]  Contact via  [email or web address]  [place] P: [phone]  F: [phone]

## 2018-07-30 NOTE — Progress Notes (Signed)
CRITICAL VALUE STICKER  CRITICAL VALUE: Positive MRSA via PCR  RECEIVER (on-site recipient of call):  Harlow Asa RN   DATE & TIME NOTIFIED: 11:10  MD NOTIFIED:  Dr. Cruzita Lederer  TIME OF NOTIFICATION:  11:24  RESPONSE:  Will follow standing protocol for CHG and Bactroban to decolonize via MRSA positive order set.

## 2018-07-31 ENCOUNTER — Encounter (HOSPITAL_COMMUNITY)
Admission: EM | Disposition: A | Discharge status: Home Health | Payer: Self-pay | Source: Home / Self Care | Attending: Internal Medicine

## 2018-07-31 ENCOUNTER — Encounter (HOSPITAL_COMMUNITY): Payer: Self-pay | Admitting: *Deleted

## 2018-07-31 ENCOUNTER — Inpatient Hospital Stay (HOSPITAL_COMMUNITY): Payer: Medicare Other | Admitting: Anesthesiology

## 2018-07-31 DIAGNOSIS — N185 Chronic kidney disease, stage 5: Secondary | ICD-10-CM

## 2018-07-31 DIAGNOSIS — M86671 Other chronic osteomyelitis, right ankle and foot: Secondary | ICD-10-CM

## 2018-07-31 DIAGNOSIS — L03115 Cellulitis of right lower limb: Secondary | ICD-10-CM

## 2018-07-31 HISTORY — PX: I & D EXTREMITY: SHX5045

## 2018-07-31 LAB — BASIC METABOLIC PANEL
Anion gap: 17 — ABNORMAL HIGH (ref 5–15)
BUN: 109 mg/dL — ABNORMAL HIGH (ref 6–20)
CO2: 12 mmol/L — ABNORMAL LOW (ref 22–32)
Calcium: 5.6 mg/dL — CL (ref 8.9–10.3)
Chloride: 103 mmol/L (ref 98–111)
Creatinine, Ser: 7.84 mg/dL — ABNORMAL HIGH (ref 0.44–1.00)
GFR calc Af Amer: 6 mL/min — ABNORMAL LOW (ref >60)
GFR calc non Af Amer: 6 mL/min — ABNORMAL LOW (ref >60)
Glucose, Bld: 137 mg/dL — ABNORMAL HIGH (ref 70–99)
Potassium: 4.4 mmol/L (ref 3.5–5.1)
Sodium: 132 mmol/L — ABNORMAL LOW (ref 135–145)

## 2018-07-31 LAB — CBC
HCT: 20.3 % — ABNORMAL LOW (ref 36.0–46.0)
Hemoglobin: 6.8 g/dL — CL (ref 12.0–15.0)
MCH: 27.9 pg (ref 26.0–34.0)
MCHC: 33.5 g/dL (ref 30.0–36.0)
MCV: 83.2 fL (ref 80.0–100.0)
Platelets: 576 10*3/uL — ABNORMAL HIGH (ref 150–400)
RBC: 2.44 MIL/uL — ABNORMAL LOW (ref 3.87–5.11)
RDW: 14.4 % (ref 11.5–15.5)
WBC: 21.5 10*3/uL — ABNORMAL HIGH (ref 4.0–10.5)
nRBC: 0.2 % (ref 0.0–0.2)

## 2018-07-31 LAB — POCT I-STAT 4, (NA,K, GLUC, HGB,HCT)
Glucose, Bld: 136 mg/dL — ABNORMAL HIGH (ref 70–99)
HCT: 24 % — ABNORMAL LOW (ref 36.0–46.0)
Hemoglobin: 8.2 g/dL — ABNORMAL LOW (ref 12.0–15.0)
Potassium: 4.2 mmol/L (ref 3.5–5.1)
Sodium: 133 mmol/L — ABNORMAL LOW (ref 135–145)

## 2018-07-31 LAB — IRON AND TIBC
Iron: 27 ug/dL — ABNORMAL LOW (ref 28–170)
Saturation Ratios: 16 % (ref 10.4–31.8)
TIBC: 171 ug/dL — ABNORMAL LOW (ref 250–450)
UIBC: 144 ug/dL

## 2018-07-31 LAB — COMPREHENSIVE METABOLIC PANEL
ALT: 19 U/L (ref 0–44)
AST: 27 U/L (ref 15–41)
Albumin: 1.8 g/dL — ABNORMAL LOW (ref 3.5–5.0)
Alkaline Phosphatase: 198 U/L — ABNORMAL HIGH (ref 38–126)
Anion gap: 16 — ABNORMAL HIGH (ref 5–15)
BUN: 111 mg/dL — ABNORMAL HIGH (ref 6–20)
CO2: 10 mmol/L — ABNORMAL LOW (ref 22–32)
Calcium: 5.4 mg/dL — CL (ref 8.9–10.3)
Chloride: 103 mmol/L (ref 98–111)
Creatinine, Ser: 7.74 mg/dL — ABNORMAL HIGH (ref 0.44–1.00)
GFR calc Af Amer: 7 mL/min — ABNORMAL LOW (ref >60)
GFR calc non Af Amer: 6 mL/min — ABNORMAL LOW (ref >60)
Glucose, Bld: 85 mg/dL (ref 70–99)
Potassium: 6.1 mmol/L — ABNORMAL HIGH (ref 3.5–5.1)
Sodium: 129 mmol/L — ABNORMAL LOW (ref 135–145)
Total Bilirubin: 1.8 mg/dL — ABNORMAL HIGH (ref 0.3–1.2)
Total Protein: 5.4 g/dL — ABNORMAL LOW (ref 6.5–8.1)

## 2018-07-31 LAB — GLUCOSE, CAPILLARY
Glucose-Capillary: 122 mg/dL — ABNORMAL HIGH (ref 70–99)
Glucose-Capillary: 129 mg/dL — ABNORMAL HIGH (ref 70–99)
Glucose-Capillary: 133 mg/dL — ABNORMAL HIGH (ref 70–99)
Glucose-Capillary: 143 mg/dL — ABNORMAL HIGH (ref 70–99)
Glucose-Capillary: 172 mg/dL — ABNORMAL HIGH (ref 70–99)
Glucose-Capillary: 202 mg/dL — ABNORMAL HIGH (ref 70–99)
Glucose-Capillary: 242 mg/dL — ABNORMAL HIGH (ref 70–99)
Glucose-Capillary: 65 mg/dL — ABNORMAL LOW (ref 70–99)
Glucose-Capillary: 95 mg/dL (ref 70–99)

## 2018-07-31 LAB — HIV ANTIBODY (ROUTINE TESTING W REFLEX): HIV Screen 4th Generation wRfx: NONREACTIVE

## 2018-07-31 LAB — FOLATE RBC
Folate, Hemolysate: 327 ng/mL
Folate, RBC: 1409 ng/mL (ref >498)
Hematocrit: 23.2 % — ABNORMAL LOW (ref 34.0–46.6)

## 2018-07-31 LAB — PREPARE RBC (CROSSMATCH)

## 2018-07-31 LAB — VANCOMYCIN, RANDOM: Vancomycin Rm: 16

## 2018-07-31 LAB — CALCIUM, IONIZED: Calcium, Ionized, Serum: 3.6 mg/dL — ABNORMAL LOW (ref 4.5–5.6)

## 2018-07-31 SURGERY — IRRIGATION AND DEBRIDEMENT EXTREMITY
Anesthesia: General | Laterality: Right

## 2018-07-31 MED ORDER — INSULIN ASPART 100 UNIT/ML ~~LOC~~ SOLN: 3.0000 [IU] | Freq: Three times a day (TID) | SUBCUTANEOUS | Status: DC

## 2018-07-31 MED ORDER — MIDAZOLAM HCL 2 MG/2ML IJ SOLN
INTRAMUSCULAR | Status: AC
  Filled 2018-07-31: qty 2

## 2018-07-31 MED ORDER — SODIUM BICARBONATE 8.4 % IV SOLN
INTRAVENOUS | Status: DC
  Administered 2018-07-31 – 2018-08-02 (×3): via INTRAVENOUS
  Filled 2018-07-31 (×10): qty 150

## 2018-07-31 MED ORDER — SUCCINYLCHOLINE CHLORIDE 200 MG/10ML IV SOSY
PREFILLED_SYRINGE | INTRAVENOUS | Status: AC
  Filled 2018-07-31: qty 10

## 2018-07-31 MED ORDER — BUPIVACAINE HCL (PF) 0.5 % IJ SOLN
INTRAMUSCULAR | Status: DC | PRN
  Administered 2018-07-31: 10 mL

## 2018-07-31 MED ORDER — INSULIN ASPART 100 UNIT/ML ~~LOC~~ SOLN
0.0000 [IU] | Freq: Every day | SUBCUTANEOUS | Status: DC
  Administered 2018-07-31: 2 [IU] via SUBCUTANEOUS
  Administered 2018-08-03: 4 [IU] via SUBCUTANEOUS

## 2018-07-31 MED ORDER — VANCOMYCIN HCL IN DEXTROSE 1-5 GM/200ML-% IV SOLN
1000.0000 mg | Freq: Once | INTRAVENOUS | Status: AC
  Administered 2018-07-31: 13:00:00 1000 mg via INTRAVENOUS
  Filled 2018-07-31: qty 200

## 2018-07-31 MED ORDER — BUPIVACAINE HCL (PF) 0.5 % IJ SOLN
INTRAMUSCULAR | Status: AC
  Filled 2018-07-31: qty 30

## 2018-07-31 MED ORDER — OXYCODONE HCL 5 MG PO TABS
5.0000 mg | ORAL_TABLET | ORAL | Status: AC | PRN
  Administered 2018-08-01 – 2018-08-05 (×2): 5 mg via ORAL
  Administered 2018-08-06: 10 mg via ORAL
  Filled 2018-07-31: qty 2
  Filled 2018-07-31 (×2): qty 1
  Filled 2018-07-31: qty 2

## 2018-07-31 MED ORDER — DARBEPOETIN ALFA 200 MCG/0.4ML IJ SOSY
200.0000 ug | PREFILLED_SYRINGE | INTRAMUSCULAR | Status: DC
  Filled 2018-07-31 (×2): qty 0.4

## 2018-07-31 MED ORDER — ONDANSETRON HCL 4 MG/2ML IJ SOLN
INTRAMUSCULAR | Status: AC
  Filled 2018-07-31: qty 2

## 2018-07-31 MED ORDER — INSULIN ASPART 100 UNIT/ML ~~LOC~~ SOLN
0.0000 [IU] | Freq: Three times a day (TID) | SUBCUTANEOUS | Status: DC
  Administered 2018-08-01 – 2018-08-02 (×2): 3 [IU] via SUBCUTANEOUS
  Administered 2018-08-03: 18:00:00 4 [IU] via SUBCUTANEOUS

## 2018-07-31 MED ORDER — CALCIUM ACETATE (PHOS BINDER) 667 MG PO CAPS
1334.0000 mg | ORAL_CAPSULE | Freq: Three times a day (TID) | ORAL | Status: DC
  Administered 2018-08-01 – 2018-08-09 (×10): 1334 mg via ORAL
  Filled 2018-07-31 (×13): qty 2

## 2018-07-31 MED ORDER — SODIUM CHLORIDE 0.9 % IR SOLN
Status: DC | PRN
  Administered 2018-07-31: 1

## 2018-07-31 MED ORDER — PROPOFOL 10 MG/ML IV BOLUS
INTRAVENOUS | Status: DC | PRN
  Administered 2018-07-31: 170 mg via INTRAVENOUS

## 2018-07-31 MED ORDER — LIDOCAINE 2% (20 MG/ML) 5 ML SYRINGE
INTRAMUSCULAR | Status: DC | PRN
  Administered 2018-07-31: 80 mg via INTRAVENOUS

## 2018-07-31 MED ORDER — SODIUM BICARBONATE 8.4 % IV SOLN
INTRAVENOUS | Status: DC
  Filled 2018-07-31 (×3): qty 1000

## 2018-07-31 MED ORDER — SODIUM CHLORIDE 0.9 % IV SOLN
INTRAVENOUS | Status: DC | PRN
  Administered 2018-07-31: 18:00:00 via INTRAVENOUS

## 2018-07-31 MED ORDER — PHENYLEPHRINE HCL (PRESSORS) 10 MG/ML IV SOLN
INTRAVENOUS | Status: DC | PRN
  Administered 2018-07-31 (×4): 80 ug via INTRAVENOUS

## 2018-07-31 MED ORDER — SUCCINYLCHOLINE CHLORIDE 20 MG/ML IJ SOLN
INTRAMUSCULAR | Status: DC | PRN
  Administered 2018-07-31: 140 mg via INTRAVENOUS

## 2018-07-31 MED ORDER — ONDANSETRON HCL 4 MG/2ML IJ SOLN
INTRAMUSCULAR | Status: DC | PRN
  Administered 2018-07-31 (×2): 4 mg via INTRAVENOUS

## 2018-07-31 MED ORDER — FENTANYL CITRATE (PF) 100 MCG/2ML IJ SOLN
INTRAMUSCULAR | Status: DC | PRN
  Administered 2018-07-31: 50 ug via INTRAVENOUS
  Administered 2018-07-31: 100 ug via INTRAVENOUS

## 2018-07-31 MED ORDER — LIDOCAINE 2% (20 MG/ML) 5 ML SYRINGE
INTRAMUSCULAR | Status: AC
  Filled 2018-07-31: qty 5

## 2018-07-31 MED ORDER — FENTANYL CITRATE (PF) 250 MCG/5ML IJ SOLN
INTRAMUSCULAR | Status: AC
  Filled 2018-07-31: qty 5

## 2018-07-31 MED ORDER — SODIUM CHLORIDE 0.9% IV SOLUTION
Freq: Once | INTRAVENOUS | Status: AC
  Administered 2018-07-31: 1000 mL via INTRAVENOUS

## 2018-07-31 MED ORDER — INSULIN GLARGINE 100 UNIT/ML ~~LOC~~ SOLN
12.0000 [IU] | Freq: Every day | SUBCUTANEOUS | Status: DC
  Administered 2018-07-31 – 2018-08-01 (×2): 12 [IU] via SUBCUTANEOUS
  Filled 2018-07-31 (×6): qty 0.12

## 2018-07-31 MED ORDER — OXYCODONE HCL 5 MG PO TABS
10.0000 mg | ORAL_TABLET | Freq: Once | ORAL | Status: DC
  Filled 2018-07-31: qty 2

## 2018-07-31 MED ORDER — MIDAZOLAM HCL 5 MG/5ML IJ SOLN
INTRAMUSCULAR | Status: DC | PRN
  Administered 2018-07-31: 2 mg via INTRAVENOUS

## 2018-07-31 SURGICAL SUPPLY — 55 items
BANDAGE ACE 4X5 VEL STRL LF (GAUZE/BANDAGES/DRESSINGS) IMPLANT
BANDAGE ELASTIC 4 VELCRO ST LF (GAUZE/BANDAGES/DRESSINGS) ×1 IMPLANT
BLADE SAW SGTL 83.5X18.5 (BLADE) ×2 IMPLANT
BLADE SURG 15 STRL LF DISP TIS (BLADE) ×3 IMPLANT
BLADE SURG 15 STRL SS (BLADE) ×6
BNDG CMPR 9X4 STRL LF SNTH (GAUZE/BANDAGES/DRESSINGS) ×1
BNDG COHESIVE 6X5 TAN STRL LF (GAUZE/BANDAGES/DRESSINGS) IMPLANT
BNDG ESMARK 4X9 LF (GAUZE/BANDAGES/DRESSINGS) ×2 IMPLANT
BNDG GAUZE ELAST 4 BULKY (GAUZE/BANDAGES/DRESSINGS) ×3 IMPLANT
BOWL SMART MIX CTS (DISPOSABLE) IMPLANT
CHLORAPREP W/TINT 26ML (MISCELLANEOUS) ×1 IMPLANT
COVER SURGICAL LIGHT HANDLE (MISCELLANEOUS) ×2 IMPLANT
COVER WAND RF STERILE (DRAPES) ×2 IMPLANT
CUFF TOURN SGL QUICK 18X4 (TOURNIQUET CUFF) ×1 IMPLANT
CUFF TOURNIQUET SINGLE 18IN (TOURNIQUET CUFF) ×2 IMPLANT
CUFF TOURNIQUET SINGLE 34IN LL (TOURNIQUET CUFF) ×2 IMPLANT
DRAPE C-ARM MINI 42X72 WSTRAPS (DRAPES) ×2 IMPLANT
DRAPE U-SHAPE 47X51 STRL (DRAPES) ×2 IMPLANT
DRSG PAD ABDOMINAL 8X10 ST (GAUZE/BANDAGES/DRESSINGS) ×2 IMPLANT
ELECT CAUTERY BLADE 6.4 (BLADE) ×2 IMPLANT
ELECT REM PT RETURN 9FT ADLT (ELECTROSURGICAL)
ELECTRODE REM PT RTRN 9FT ADLT (ELECTROSURGICAL) IMPLANT
GAUZE SPONGE 4X4 12PLY STRL (GAUZE/BANDAGES/DRESSINGS) ×2 IMPLANT
GAUZE XEROFORM 1X8 LF (GAUZE/BANDAGES/DRESSINGS) ×2 IMPLANT
GLOVE BIO SURGEON STRL SZ8 (GLOVE) ×2 IMPLANT
GLOVE BIOGEL PI IND STRL 8 (GLOVE) ×1 IMPLANT
GLOVE BIOGEL PI INDICATOR 8 (GLOVE) ×1
GOWN STRL REUS W/ TWL LRG LVL3 (GOWN DISPOSABLE) ×2 IMPLANT
GOWN STRL REUS W/TWL LRG LVL3 (GOWN DISPOSABLE) ×4
HANDPIECE INTERPULSE COAX TIP (DISPOSABLE) ×2
KIT BASIN OR (CUSTOM PROCEDURE TRAY) ×2 IMPLANT
KIT TURNOVER KIT B (KITS) ×2 IMPLANT
MANIFOLD NEPTUNE II (INSTRUMENTS) ×2 IMPLANT
NDL HYPO 25GX1X1/2 BEV (NEEDLE) ×1 IMPLANT
NEEDLE HYPO 25GX1X1/2 BEV (NEEDLE) ×2 IMPLANT
NS IRRIG 1000ML POUR BTL (IV SOLUTION) ×2 IMPLANT
PACK ORTHO EXTREMITY (CUSTOM PROCEDURE TRAY) ×2 IMPLANT
PAD ARMBOARD 7.5X6 YLW CONV (MISCELLANEOUS) ×4 IMPLANT
PAD CAST 4YDX4 CTTN HI CHSV (CAST SUPPLIES) ×1 IMPLANT
PADDING CAST COTTON 4X4 STRL (CAST SUPPLIES) ×2
PADDING CAST COTTON 6X4 STRL (CAST SUPPLIES) ×2 IMPLANT
SCRUB BETADINE 4OZ XXX (MISCELLANEOUS) ×2 IMPLANT
SET HNDPC FAN SPRY TIP SCT (DISPOSABLE) IMPLANT
SOL PREP POV-IOD 4OZ 10% (MISCELLANEOUS) ×2 IMPLANT
STAPLER VISISTAT 35W (STAPLE) ×2 IMPLANT
STOCKINETTE IMPERVIOUS 9X36 MD (GAUZE/BANDAGES/DRESSINGS) ×2 IMPLANT
SUT PROLENE 3 0 PS 2 (SUTURE) ×2 IMPLANT
SWAB COLLECTION DEVICE MRSA (MISCELLANEOUS) ×2 IMPLANT
SWAB CULTURE LIQ STUART DBL (MISCELLANEOUS) ×2 IMPLANT
SWAB CULTURE LIQUID MINI MALE (MISCELLANEOUS) ×2 IMPLANT
SYR CONTROL 10ML LL (SYRINGE) ×2 IMPLANT
TOWEL OR 17X24 6PK STRL BLUE (TOWEL DISPOSABLE) ×2 IMPLANT
TOWEL OR 17X26 10 PK STRL BLUE (TOWEL DISPOSABLE) ×2 IMPLANT
TUBE CONNECTING 12X1/4 (SUCTIONS) ×2 IMPLANT
YANKAUER SUCT BULB TIP NO VENT (SUCTIONS) ×2 IMPLANT

## 2018-07-31 NOTE — Consult Note (Signed)
Referring Provider: No ref. provider found Primary Care Physician:  Loyola Mast, PA-C Primary Nephrologist:  Dr. Justin Mend  Reason for Consultation: Management of stage V chronic kidney disease, assessment and treatment of anemia, evaluation for secondary hyperparathyroidism, maintenance of euvolemia and metabolic acidosis  HPI: This is a 48 year old female with poorly controlled diabetes she has stage V chronic kidney disease she was evaluated in August 2019 at [facility] by myself.  She was lost to follow-up.  She was scheduled to see vein and vascular surgery for placement of AV fistula and kidney options class.  She never attended appointments.  She presents here with suspected tenosynovitis and osteomyelitis of right lower foot.  She has been evaluated by Dr. Erlinda Hong and is planned for debridement.  Last creatinine was 3.05 November 2018.    Blood pressure 119/66 pulse 94 temperature 99.3 T-max 100 O2 sats 98% room air   no recorded urine output weight 61.9 kg  Medications meropenem 1 g every 24 hours, vancomycin per pharmacy, amlodipine 5 mg daily, pravastatin 40 mg daily insulin pump, Claritin 10 mg nightly  Renal ultrasound 07/30/2018 increased echogenicity no hydronephrosis MRI ankle right consistent with infectious tenosynovitis anterior tibialis tendon extensor hallucis longus tendon with pus extending into the first metatarsal tarsal joint.  Osteomyelitis medial cuneiform and base of first and second metatarsals  Chest x-ray 07/29/2018 bronchitic changes without infiltrate  Sodium 132 potassium 4.4 chloride 103 CO2 12 glucose 137 BUN 109 creatinine 7.84 calcium 5.6.  Albumin 1.8 AST 27 ALT 19.  WBC 21.5 hemoglobin 6.8 platelets 576 hemoglobin A1c 10.4 TSH 2.02 urinalysis greater than 500 glucose greater than 300 protein positive nitrites hazy appearing urine.  WBCs 21-50 per high-powered film no RBCs  IV sodium bicarbonate   Past Medical History:  Diagnosis Date  . Anemia    . Arthritis   . Bronchitis   . C. difficile diarrhea 09/26/2014  . Cataracts, both eyes   . Cellulitis of right foot 06/02/2014  . CKD (chronic kidney disease) stage 3, GFR 30-59 ml/min (HCC)    sees Dr Justin Mend- Stage IV  . Diabetic ulcer of right foot ([place]) 06/02/2014  . DVT (deep venous thrombosis) ([place]) 10/2014   one in each one in leg- PICC line , one in right and left arm  . H/O seasonal allergies   . Heart murmur    told once when she was pregnant- no furter mention- was in notes 11/2015- had Echo 8 /4/17  . History of blood transfusion   . Hypercholesteremia   . Hypertension   . Left foot infection   . Neuropathy    feet  . Neuropathy in diabetes ([place])   . Peripheral vascular disease ([place])   . Sleep apnea    not able to use CPAP  . Staphylococcus aureus bacteremia 10/2014  . Type 1 diabetes ([place])    onset age 64    Past Surgical History:  Procedure Laterality Date  . AMPUTATION TOE Left 07/24/2015   5th toe and Hallux amputation  . CESAREAN SECTION    . EXOSTECTECTOMY TOE Left 04/11/2016   Procedure: EXOSTECTECTOMY CUBOID AND 5TH METATARSAL AND PLACEMENT OF ANTIBIOTIC BEADS;  Surgeon: Edrick Kins, DPM;  Location: [facility];  Service: Podiatry;  Laterality: Left;  . EYE SURGERY Bilateral    [place]  . FEMORAL-POPLITEAL BYPASS GRAFT Left 05/03/2015  . FOOT AMPUTATION THROUGH METATARSAL Right 2016  . hemrrhoidectomy    . I&D EXTREMITY Right 06/10/2014   Procedure: IRRIGATION  AND DEBRIDEMENT Right Foot;  Surgeon: Newt Minion, MD;  Location: [facility];  Service: Orthopedics;  Laterality: Right;  . INCISION AND DRAINAGE Left 04/11/2016   Procedure: INCISION AND DRAINAGE A DEEP COMPLICATED WOUND OF LEFT FOOT;  Surgeon: Edrick Kins, DPM;  Location: [facility];  Service: Podiatry;  Laterality: Left;  . IR FLUORO GUIDE CV LINE RIGHT  09/24/2017  . IR REMOVAL TUN CV CATH W/O FL  11/22/2017  . IR US GUIDE VASC ACCESS RIGHT  09/24/2017  . IRRIGATION AND DEBRIDEMENT ABSCESS Left 05/06/2017    Procedure: IRRIGATION AND DEBRIDEMENT ABSCESS LEFT FOOT;  Surgeon: Edrick Kins, DPM;  Location: [facility];  Service: Podiatry;  Laterality: Left;  . PERIPHERALLY INSERTED CENTRAL CATHETER INSERTION    . SKIN GRAFT Right 06/15/2014  . SKIN GRAFT Left 06/2015   foot   . SKIN SPLIT GRAFT Right 06/15/2014   Procedure: SPLIT THICKNESS SKIN GRAFT RIGHT FOOT;  Surgeon: Newt Minion, MD;  Location: [facility];  Service: Orthopedics;  Laterality: Right;  . TRANSMETATARSAL AMPUTATION Right   . TRANSMETATARSAL AMPUTATION Right 09/11/2014    Prior to Admission medications   Medication Sig Start Date End Date Taking? Authorizing Provider  albuterol (PROVENTIL HFA;VENTOLIN HFA) 108 (90 BASE) MCG/ACT inhaler Inhale 2 puffs into the lungs every 6 (six) hours as needed for wheezing or shortness of breath. Reported on 04/15/2015    [provider]  amLODipine (NORVASC) 5 MG tablet Take 1 tablet (5 mg total) by mouth daily. Patient taking differently: Take 5 mg by mouth every evening.  05/10/17   Eugenie Filler, MD  aspirin 81 MG tablet Take 81 mg by mouth every evening.     [provider]  BD PEN NEEDLE NANO U/F 32G X 4 MM MISC USE AS DIRECTED TWICE A DAY 06/25/18   Elayne Snare, MD  calcitRIOL (ROCALTROL) 0.5 MCG capsule Take 1 capsule daily 08/13/17   Elayne Snare, MD  Calcium-Phosphorus-Vitamin D (CALCIUM GUMMIES) [phone] MG-MG-UNIT CHEW Chew 2 each by mouth every evening.     [provider]  CINNAMON PO Take 1 tablet by mouth every evening.     [provider]  Continuous Blood Gluc Receiver (FREESTYLE LIBRE READER) DEVI 1 Device by Does not apply route as directed. 02/28/17   Elayne Snare, MD  Continuous Blood Gluc Sensor (FREESTYLE LIBRE SENSOR SYSTEM) MISC Use 1 sensor every 10 days 02/28/17   Elayne Snare, MD  folic acid (FOLVITE) 1 MG tablet Take 1 mg by mouth every evening.     [provider]  furosemide (LASIX) 40 MG tablet Take 40 mg by mouth daily as  needed for fluid.     [provider]  HUMALOG 100 UNIT/ML injection CALL MD IF <70, IF 151-200 =2UNITS,201-250 =4 UNITS,251-300 =6 UNITS, 301-350 =8 UNITS,351-400 =10 U 05/28/18   Elayne Snare, MD  ketorolac (ACULAR) 0.5 % ophthalmic solution PLACE 1 DROP IN RIGHT EYE FOUR TIMES A DAY. START 1 WEEK PRIOR TO SURGERY 08/20/17   [provider]  loratadine (CLARITIN) 10 MG tablet Take 1 tablet (10 mg total) by mouth daily. Patient taking differently: Take 10 mg by mouth daily as needed for allergies.  05/11/17   Eugenie Filler, MD  norethindrone (AYGESTIN) 5 MG tablet Take by mouth. 06/26/17   [provider]  ondansetron (ZOFRAN-ODT) 4 MG disintegrating tablet DISSOLVE 1 TABLET ON TONGUE EVERY 8 HOURS AS NEEDED FOR NAUSEA 04/23/16   [provider]  pravastatin (  PRAVACHOL) 40 MG tablet TAKE 1 TABLET BY MOUTH EVERY DAY 07/12/18   Elayne Snare, MD  pyridoxine (B-6) 100 MG tablet Take 100 mg by mouth daily. Reported on 04/15/2015    [provider]  vitamin B-12 (V-R VITAMIN B-12) 500 MCG tablet Take by mouth.    [provider]  vitamin C (ASCORBIC ACID) 500 MG tablet Take 500 mg by mouth daily.    [provider]  Vitamin D, Ergocalciferol, (DRISDOL) 50000 units CAPS capsule TAKE ONE CAPSULE BY MOUTH ONE TIME PER WEEK 11/02/17   Elayne Snare, MD    Current Facility-Administered Medications  Medication Dose Route Frequency Provider Last Rate Last Dose  . 0.9 %  sodium chloride infusion (Manually program via Guardrails IV Fluids)   Intravenous Once Kirby-Graham, Karsten Fells, NP      . acetaminophen (TYLENOL) tablet 650 mg  650 mg Oral Q6H PRN Bennie Pierini, MD       Or  . acetaminophen (TYLENOL) suppository 650 mg  650 mg Rectal Q6H PRN Bennie Pierini, MD      . albuterol (PROVENTIL) (2.5 MG/3ML) 0.083% nebulizer solution 3 mL  3 mL Inhalation Q6H PRN Bennie Pierini, MD      . amLODipine (NORVASC) tablet 5 mg  5 mg Oral QHS Bennie Pierini, MD   5 mg at 07/30/18 2112  . benzonatate (TESSALON) capsule 100 mg  100 mg Oral TID PRN Bennie Pierini, MD   100 mg at 07/30/18 1556  . calcium carbonate (TUMS - dosed in mg elemental calcium) chewable tablet 200 mg of elemental calcium  1 tablet Oral BID PRN Caren Griffins, MD   200 mg of elemental calcium at 07/30/18 2118  . Chlorhexidine Gluconate Cloth 2 % PADS 6 each  6 each Topical Q0600 Caren Griffins, MD   6 each at 07/31/18 [phone]  . docusate sodium (COLACE) capsule 100 mg  100 mg Oral BID Bennie Pierini, MD      . guaiFENesin-dextromethorphan [facility] DM) 100-10 MG/5ML syrup 5 mL  5 mL Oral Q4H PRN Caren Griffins, MD   5 mL at 07/30/18 2113  . heparin injection 5,000 Units  5,000 Units Subcutaneous Q8H Bennie Pierini, MD   5,000 Units at 07/31/18 [phone]  . insulin pump   Subcutaneous TID AC, HS, 0200 Gherghe, Costin M, MD      . loratadine (CLARITIN) tablet 10 mg  10 mg Oral QHS Schertz, Michele Mcalpine, MD      . meropenem [facility]) 1 g in sodium chloride 0.9 % 100 mL IVPB  1 g Intravenous Q24H Franky Macho, RPH 200 mL/hr at 07/31/18 1029 1 g at 07/31/18 1029  . mupirocin ointment (BACTROBAN) 2 % 1 application  1 application Nasal BID Caren Griffins, MD   1 application at 02/72/53 1032  . oxyCODONE (Oxy IR/ROXICODONE) immediate release tablet 5 mg  5 mg Oral Q6H PRN Bennie Pierini, MD   5 mg at 07/30/18 1656  . pravastatin (PRAVACHOL) tablet 40 mg  40 mg Oral Daily Bennie Pierini, MD   40 mg at 07/31/18 1022  . senna (SENOKOT) tablet 8.6 mg  1 tablet Oral BID Bennie Pierini, MD      . sodium bicarbonate 150 mEq in dextrose 5 % 1,000 mL infusion   Intravenous Continuous Caren Griffins, MD 100 mL/hr at 07/31/18 1030    . sodium chloride flush (NS) 0.9 % injection 3 mL  3 mL Intravenous Q12H Bennie Pierini, MD   3 mL at 07/30/18 0859  . vancomycin (VANCOCIN) IVPB 1000 mg/200 mL premix  1,000 mg Intravenous Once Caren Griffins, MD      .  vancomycin variable dose per unstable renal function (pharmacist dosing)   Does not apply See admin instructions Dorrene German, [facility]        Allergies as of 07/29/2018 - Review Complete 07/29/2018  Allergen Reaction Noted  . Cleocin [clindamycin hcl] Diarrhea 03/18/2012  . Lisinopril Other (See Comments) 01/26/2015  . Amoxicillin Diarrhea, Nausea Only, and Other (See Comments) 08/06/2016  . Bactrim [sulfamethoxazole-trimethoprim] Diarrhea and Nausea Only 08/06/2016    Family History  Problem Relation Age of Onset  . Hyperlipidemia Mother   . Dementia Mother     Social History   Socioeconomic History  . Marital status: Single    Spouse name: Not on file  . Number of children: Not on file  . Years of education: Not on file  . Highest education level: Not on file  Occupational History  . Occupation: disabled  Social Needs  . Financial resource strain: Not on file  . Food insecurity:    Worry: Not on file    Inability: Not on file  . Transportation needs:    Medical: Not on file    Non-medical: Not on file  Tobacco Use  . Smoking status: Former Smoker    Years: 15.00    Last attempt to quit: 05/16/2008    Years since quitting: 10.2  . Smokeless tobacco: Never Used  Substance and Sexual Activity  . Alcohol use: Yes    Comment: Occasional  . Drug use: No  . Sexual activity: Yes    Birth control/protection: Pill  Lifestyle  . Physical activity:    Days per week: Not on file    Minutes per session: Not on file  . Stress: Not on file  Relationships  . Social connections:    Talks on phone: Not on file    Gets together: Not on file    Attends religious service: Not on file    Active member of club or organization: Not on file    Attends meetings of clubs or organizations: Not on file    Relationship status: Not on file  . Intimate partner violence:    Fear of current or ex partner: Not on file    Emotionally abused: Not on file    Physically abused: Not on file     Forced sexual activity: Not on file  Other Topics Concern  . Not on file  Social History Narrative  . Not on file    Review of Systems: Gen: Fevers and chills no weight loss lower extremity swelling HEENT: History of retinopathy complains of visual loss CV: Denies chest pain, angina, palpitations, syncope, orthopnea, PND, peripheral edema, and claudication. Resp: Denies dyspnea at rest, dyspnea with exercise, cough, sputum, wheezing, coughing up blood, and pleurisy. GI: Denies vomiting blood, jaundice, and fecal incontinence.   Denies dysphagia or odynophagia. GU : Denies urinary burning, blood in urine, urinary frequency, urinary hesitancy, nocturnal urination, and urinary incontinence.  No renal calculi. MS: MRI consistent with osteomyelitis and tenosynovitis Derm: Amputation right trans-met Psych: Denies depression, anxiety, memory loss, suicidal ideation, hallucinations, paranoia, and confusion. Heme: Denies bruising, bleeding, and enlarged lymph nodes. Neuro: History of peripheral neuropathy Endocrine history of uncontrolled diabetes no Thyroid disease.  No Adrenal disease.  Physical Exam: Vital signs in last 24  hours: Temp:  [97.8 F (36.6 C)-100 F (37.8 C)] 99.3 F (37.4 C) (04/30 1124) Pulse Rate:  [90-100] 94 (04/30 1125) Resp:  [13-22] 17 (04/30 1125) BP: (103-125)/(57-83) 119/66 (04/30 1125) SpO2:  [96 %-100 %] 99 % (04/30 1125) Weight:  [61.9 kg] 61.9 kg (04/30 0448) Last BM Date: 07/28/18 General:   Ill-appearing lady nondistressed Head:  Normocephalic and atraumatic. Eyes:  Sclera clear, no icterus.  Pale mucous membranes Ears:  Normal auditory acuity. Nose:  No deformity, discharge,  or lesions. Mouth:  No deformity or lesions, dentition normal. Neck:  Supple; no masses or thyromegaly. JVP not elevated Lungs:  Clear throughout to auscultation.   No wheezes, crackles, or rhonchi. No acute distress. Heart:  Regular rate and rhythm; no murmurs, clicks, rubs,   or gallops. Abdomen:  Soft, nontender and nondistended. No masses, hepatosplenomegaly or hernias noted. Normal bowel sounds, without guarding, and without rebound.   Msk: Right trans-met.  Skin shiny taut red tender Pulses:  No carotid, renal, femoral bruits. DP and PT symmetrical and equal Extremities: Foot edema 1-2+ Neurologic:  Alert and  oriented x4;  grossly normal neurologically. Skin: Right amputation.  Left plantar aspect of foot with dressing Cervical Nodes:  No significant cervical adenopathy. Psych:  Alert and cooperative. Normal mood and affect.  Intake/Output from previous day: 04/29 0701 - 04/30 0700 In: 1591.6 [P.O.:660; I.V.:931.6] Out: -  Intake/Output this shift: No intake/output data recorded.  Lab Results: Recent Labs    07/29/18 1557 07/30/18 0104 07/31/18 0300  WBC 24.7* 27.0* 21.5*  HGB 8.5* 8.2* 6.8*  HCT 26.8* 25.3* 20.3*  PLT 528* 597* 576*   BMET Recent Labs    07/30/18 0104 07/31/18 0300 07/31/18 0651  NA 135 129* 132*  K 4.5 6.1* 4.4  CL 109 103 103  CO2 7* 10* 12*  GLUCOSE 147* 85 137*  BUN 102* 111* 109*  CREATININE 7.23* 7.74* 7.84*  CALCIUM 6.8* 5.4* 5.6*  PHOS 5.9*  --   --    LFT Recent Labs    07/31/18 0300  PROT 5.4*  ALBUMIN 1.8*  AST 27  ALT 19  ALKPHOS 198*  BILITOT 1.8*   PT/INR Recent Labs    07/30/18 0104  LABPROT 17.2*  INR 1.4*   Hepatitis Panel No results for input(s): HEPBSAG, HCVAB, HEPAIGM, HEPBIGM in the last 72 hours.  Studies/Results: US Renal  Result Date: 07/30/2018 CLINICAL DATA:  Initial evaluation for acute renal injury. History of diabetes. EXAM: RENAL / URINARY TRACT ULTRASOUND COMPLETE COMPARISON:  Prior CT from 02/01/2014 FINDINGS: Right Kidney: Renal measurements: 9.0 x 4.4 x 4.6 cm = volume: 94.9 mL. Increased echogenicity within the renal parenchyma. No mass or hydronephrosis visualized. Left Kidney: Renal measurements: 8.3 x 4.5 x 4.7 cm = volume: 93.9 mL. Increased echogenicity  within the renal parenchyma. No mass or hydronephrosis visualized. Bladder: Mild circumferential bladder wall thickening, likely related incomplete distension. IMPRESSION: 1. Increased echogenicity within the renal parenchyma, compatible with medical renal disease. 2. No hydronephrosis. Electronically Signed   By: Jeannine Boga M.D.   On: 07/30/2018 01:37   Mr Ankle Right Wo Contrast  Result Date: 07/29/2018 CLINICAL DATA:  Pain and swelling of the right ankle. Previous amputation of the foot at the level of the bases of the metatarsals. EXAM: MRI OF THE RIGHT ANKLE WITHOUT CONTRAST TECHNIQUE: Multiplanar, multisequence MR imaging of the ankle was performed. No intravenous contrast was administered. COMPARISON:  MRI dated 05/05/2018 and radiographs dated 08/06/2017 FINDINGS: TENDONS Peroneal:  There is a tiny amount of fluid in the peroneus longus tendon sheath, unchanged since the prior study. Posteromedial: There is increased fluid in the sheath of the posterior tibialis tendon just distal to the tip of the medial malleolus but this is minimal. Anterior: There is tenosynovitis involving the stumps of the tibialis anterior tendon and extensor hallucis longus tendon. The fluid, likely pus, extends from the stumps of the tendons into the first tarsal metatarsal joint and around the base of the first metatarsal. Achilles: Normal. Plantar Fascia: Normal. LIGAMENTS Lateral: Intact. Medial: Intact. CARTILAGE Ankle Joint: No significant abnormality. Subtalar Joints/Sinus Tarsi: Progressive arthritis of the posterior facets of the subtalar joint. Ligaments of the sinus tarsi are intact. Bones: The patient has developed patchy mottled signal abnormalities in bases of the first and second metatarsal stumps and to a lesser degree in the bases of the third, fourth and fifth metatarsal stumps. There is also new patchy signal abnormality in the medial and middle cuneiforms as well as in the navicular and to a lesser  degree in the lateral cuneiform and cuboid. Other: Diffuse subcutaneous edema around the ankle and in the foot. IMPRESSION: 1. Findings consistent with infectious tenosynovitis of the anterior tibialis tendon and extensor hallucis longus tendon with pus extending into the first tarsal metatarsal joint. Probable osteomyelitis involving the medial cuneiform and bases of the first and second metatarsals. I suspect there is less severe osteomyelitis involving the other metatarsal bases, the middle and lateral cuneiforms, and the navicular and possibly the cuboid. Electronically Signed   By: Lorriane Shire M.D.   On: 07/29/2018 20:27   Dg Chest Portable 1 View  Result Date: 07/29/2018 CLINICAL DATA:  Fever for 5 days, nonproductive cough, diarrhea, nausea EXAM: PORTABLE CHEST 1 VIEW COMPARISON:  Portable exam 1518 hours compared to 05/04/2017 FINDINGS: Normal heart size, mediastinal contours, and pulmonary vascularity. Peribronchial thickening, new. No acute infiltrate, pleural effusion, or pneumothorax. Bones appear demineralized. IMPRESSION: Bronchitic changes without infiltrate. Electronically Signed   By: Lavonia Dana M.D.   On: 07/29/2018 16:15   Dg Foot Complete Right  Result Date: 07/30/2018 CLINICAL DATA:  Osteomyelitis.  Right foot infection. EXAM: RIGHT FOOT COMPLETE - 3+ VIEW COMPARISON:  None. FINDINGS: The patient has had forefoot amputation at the level of the bases of the metatarsals. There is a small area of the erosion of the cortex of stump of the fifth metatarsal seen only on the lateral view. There is soft tissue swelling of the stump and on the dorsum of the foot and anterior aspect of the ankle. No fractures. IMPRESSION: Possible focal osteomyelitis of the stump of the fifth metatarsal. Soft tissue swelling. Electronically Signed   By: Lorriane Shire M.D.   On: 07/30/2018 08:47    Assessment/Plan:  Stage V chronic kidney disease.  There does not appear to be any absolute immediate  indication for dialysis at this time.  This is secondary to diabetic nephropathy.  She had advanced disease in August 2019 she did not attend kidney options class and she did not get an AV fistula placed as recommended.  I do believe that she will need to start dialysis during this hospitalization.  She will need placement of PermCath and possible creation of AV fistula.  This may be difficult to accomplish during this hospitalization as elective surgery is not being scheduled.  We will discuss with [facility].  Anemia.  We will check iron studies she is status post transfusion for hemoglobin 6.8 we will also  start her on darbepoetin 200 mcg weekly  Bones will check PTH and evaluate bone mineral.  Calcium is low but does correct for albumin.  Last phosphorus was 5.9 we will startcalcium based binders  Metabolic acidosis continue IV bicarbonate.100 cc/hr   Left foot tenosynovitis with frank pus and forefoot osteomyelitis followed by orthopedics appreciate input  Vancomycin and Meropenum  Hyperlipidemia continues on pravastatin   Diabetes mellitus per primary team  Visual loss history of retinopathy recommend ophthalmology consultation   LOS: 2 Sherril Croon '@TODAY''@12'$ :37 PM

## 2018-07-31 NOTE — Anesthesia Postprocedure Evaluation (Signed)
Anesthesia Post Note  Patient: [name]  Procedure(s) Performed: IRRIGATION AND DEBRIDEMENT EXTREMITY (Right )     Patient location during evaluation: PACU Anesthesia Type: General Level of consciousness: awake Pain management: pain level controlled Vital Signs Assessment: post-procedure vital signs reviewed and stable Respiratory status: spontaneous breathing Cardiovascular status: stable Postop Assessment: no apparent nausea or vomiting Anesthetic complications: no    Last Vitals:  Vitals:   07/31/18 1934 07/31/18 1949  BP: 107/61 111/67  Pulse: 94 92  Resp: 18 (!) 28  Temp:  (!) 36.3 C  SpO2: 94% 94%    Last Pain:  Vitals:   07/31/18 1949  TempSrc:   PainSc: Asleep                 Raylin Winer

## 2018-07-31 NOTE — Progress Notes (Signed)
CRITICAL VALUE ALERT  Critical Value: potassium 6.1   Date & Time Notied: 07/31/18 @ 2984  Provider Notified: Baltazar Najjar   Orders Received/Actions taken:  awiating response

## 2018-07-31 NOTE — Consult Note (Addendum)
[facility] NOTE   MRN : [id number]  Reason for Consult: ESRD Referring Physician: Dr. Justin Mend  History of Present Illness: 48 y/o with history of CKD stage V with history of poorly controlled DM.  She has been referred to [place] in the past for AV fistula verses graft placement, but did not show up for appointments.  We have been consulted for access placement and TDC placement.  She has a right LE wound consistant with osteomyelitis, fever, and leukocytosis.  The plan is for Dr Erlinda Hong to debride the wound.    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     Current Facility-Administered Medications  Medication Dose Route Frequency Provider Last Rate Last Dose  . 0.9 %  sodium chloride infusion (Manually program via Guardrails IV Fluids)   Intravenous Once Kirby-Graham, Karsten Fells, NP      . acetaminophen (TYLENOL) tablet 650 mg  650 mg Oral Q6H PRN Bennie Pierini, MD       Or  . acetaminophen (TYLENOL) suppository 650 mg  650 mg Rectal Q6H PRN Bennie Pierini, MD      . albuterol (PROVENTIL) (2.5 MG/3ML) 0.083% nebulizer solution 3 mL  3 mL Inhalation Q6H PRN Bennie Pierini, MD      . amLODipine (NORVASC) tablet 5 mg  5 mg Oral QHS Bennie Pierini, MD   5 mg at 07/30/18 2112  . benzonatate (TESSALON) capsule 100 mg  100 mg Oral TID PRN Bennie Pierini, MD   100 mg at 07/30/18 1556  . calcium acetate (PHOSLO) capsule 1,334 mg  1,334 mg Oral TID WC Edrick Oh, MD      . calcium carbonate (TUMS - dosed in mg elemental calcium) chewable tablet 200 mg of elemental calcium  1 tablet Oral BID PRN Caren Griffins, MD   200 mg of elemental calcium at 07/30/18 2118  . Chlorhexidine Gluconate Cloth 2 % PADS 6 each  6 each Topical Q0600 Caren Griffins, MD   6 each at 07/31/18 [id number]  . Darbepoetin Alfa (ARANESP) injection 200 mcg  200 mcg Subcutaneous Q Thu-1800 Edrick Oh, MD      . docusate sodium (COLACE) capsule 100 mg  100 mg Oral BID Bennie Pierini, MD      . guaiFENesin-dextromethorphan [facility] DM) 100-10 MG/5ML syrup 5 mL  5 mL Oral Q4H PRN Caren Griffins, MD   5 mL at 07/30/18 2113  . heparin injection 5,000 Units  5,000 Units Subcutaneous Q8H Bennie Pierini, MD   5,000 Units at 07/31/18 [id number]  . insulin pump   Subcutaneous TID AC, HS, 0200 Gherghe, Costin M, MD      . loratadine (CLARITIN) tablet 10 mg  10 mg Oral QHS Schertz, Michele Mcalpine, MD      . meropenem [facility]) 1 g in sodium chloride 0.9 % 100 mL IVPB  1 g Intravenous Q24H Franky Macho, RPH 200 mL/hr at 07/31/18 1029 1 g at 07/31/18 1029  . mupirocin ointment (BACTROBAN) 2 % 1 application  1 application Nasal BID Caren Griffins, MD   1 application at 76/73/41 1032  . oxyCODONE (Oxy IR/ROXICODONE) immediate release tablet 5 mg  5 mg Oral Q6H PRN Bennie Pierini, MD   5 mg at 07/30/18 1656  . pravastatin (PRAVACHOL) tablet 40 mg  40 mg Oral Daily Schertz,  Michele Mcalpine, MD   40 mg at 07/31/18 1022  . senna (SENOKOT) tablet 8.6 mg  1 tablet Oral BID Bennie Pierini, MD      . sodium bicarbonate 150 mEq in dextrose 5 % 1,000 mL infusion   Intravenous Continuous Caren Griffins, MD 100 mL/hr at 07/31/18 1030    . sodium chloride flush (NS) 0.9 % injection 3 mL  3 mL Intravenous Q12H Bennie Pierini, MD   3 mL at 07/30/18 0859  . vancomycin (VANCOCIN) IVPB 1000 mg/200 mL premix  1,000 mg Intravenous Once Caren Griffins, MD 200 mL/hr at 07/31/18 1250 1,000 mg at 07/31/18 1250  . vancomycin variable dose per unstable renal function (pharmacist dosing)   Does not apply See admin instructions Dorrene German, RPH        Pt meds include: Statin :Yes Betablocker: No ASA: Yes Other anticoagulants/antiplatelets: none  Past Medical History:  Diagnosis Date  . Anemia   . Arthritis   . Bronchitis   . C. difficile diarrhea 09/26/2014  . Cataracts, both eyes   .  Cellulitis of right foot 06/02/2014  . CKD (chronic kidney disease) stage 3, GFR 30-59 ml/min (HCC)    sees Dr Justin Mend- Stage IV  . Diabetic ulcer of right foot ([place]) 06/02/2014  . DVT (deep venous thrombosis) ([place]) 10/2014   one in each one in leg- PICC line , one in right and left arm  . H/O seasonal allergies   . Heart murmur    told once when she was pregnant- no furter mention- was in notes 11/2015- had Echo 8 /4/17  . History of blood transfusion   . Hypercholesteremia   . Hypertension   . Left foot infection   . Neuropathy    feet  . Neuropathy in diabetes ([place])   . Peripheral vascular disease ([place])   . Sleep apnea    not able to use CPAP  . Staphylococcus aureus bacteremia 10/2014  . Type 1 diabetes ([place])    onset age 65    Past Surgical History:  Procedure Laterality Date  . AMPUTATION TOE Left 07/24/2015   5th toe and Hallux amputation  . CESAREAN SECTION    . EXOSTECTECTOMY TOE Left 04/11/2016   Procedure: EXOSTECTECTOMY CUBOID AND 5TH METATARSAL AND PLACEMENT OF ANTIBIOTIC BEADS;  Surgeon: Edrick Kins, DPM;  Location: [facility];  Service: Podiatry;  Laterality: Left;  . EYE SURGERY Bilateral    [place]  . FEMORAL-POPLITEAL BYPASS GRAFT Left 05/03/2015  . FOOT AMPUTATION THROUGH METATARSAL Right 2016  . hemrrhoidectomy    . I&D EXTREMITY Right 06/10/2014   Procedure: IRRIGATION AND DEBRIDEMENT Right Foot;  Surgeon: Newt Minion, MD;  Location: [facility];  Service: Orthopedics;  Laterality: Right;  . INCISION AND DRAINAGE Left 04/11/2016   Procedure: INCISION AND DRAINAGE A DEEP COMPLICATED WOUND OF LEFT FOOT;  Surgeon: Edrick Kins, DPM;  Location: [facility];  Service: Podiatry;  Laterality: Left;  . IR FLUORO GUIDE CV LINE RIGHT  09/24/2017  . IR REMOVAL TUN CV CATH W/O FL  11/22/2017  . IR US GUIDE VASC ACCESS RIGHT  09/24/2017  . IRRIGATION AND DEBRIDEMENT ABSCESS Left 05/06/2017   Procedure: IRRIGATION AND DEBRIDEMENT ABSCESS LEFT FOOT;  Surgeon: Edrick Kins, DPM;  Location: [facility];  Service: Podiatry;  Laterality: Left;  . PERIPHERALLY INSERTED CENTRAL CATHETER INSERTION    . SKIN GRAFT Right 06/15/2014  . SKIN GRAFT Left 06/2015   foot   .  SKIN SPLIT GRAFT Right 06/15/2014   Procedure: SPLIT THICKNESS SKIN GRAFT RIGHT FOOT;  Surgeon: Newt Minion, MD;  Location: [facility];  Service: Orthopedics;  Laterality: Right;  . TRANSMETATARSAL AMPUTATION Right   . TRANSMETATARSAL AMPUTATION Right 09/11/2014    Social History Social History   Tobacco Use  . Smoking status: Former Smoker    Years: 15.00    Last attempt to quit: 05/16/2008    Years since quitting: 10.2  . Smokeless tobacco: Never Used  Substance Use Topics  . Alcohol use: Yes    Comment: Occasional  . Drug use: No    Family History Family History  Problem Relation Age of Onset  . Hyperlipidemia Mother   . Dementia Mother     Allergies  Allergen Reactions  . Cleocin [Clindamycin Hcl] Diarrhea  . Lisinopril Other (See Comments)    Elevated potassium per pt report  . Amoxicillin Diarrhea, Nausea Only and Other (See Comments)    Has patient had a PCN reaction causing immediate rash, facial/tongue/throat swelling, SOB or lightheadedness with hypotension: No Has patient had a PCN reaction causing severe rash involving mucus membranes or skin necrosis: No Has patient had a PCN reaction that required hospitalization: No Has patient had a PCN reaction occurring within the last 10 years: No If all of the above answers are "NO", then may proceed with Cephalosporin use.   . Bactrim [Sulfamethoxazole-Trimethoprim] Diarrhea and Nausea Only     REVIEW OF SYSTEMS  General: [ ]  Weight loss, [ x] Fever, [ ]  chills Neurologic: [ ]  Dizziness, [ ]  Blackouts, [ ]  Seizure [ ]  Stroke, [ ]  "Mini stroke", [ ]  Slurred speech, [ ]  Temporary blindness; [ ]  weakness in arms or legs, [ ]  Hoarseness [ ]  Dysphagia [x]  Cardiac: [ ]  Chest pain/pressure, [ ]  Shortness of breath at rest [ ]  Shortness of breath with  exertion, [ ]  Atrial fibrillation or irregular heartbeat  Vascular: [ ]  Pain in legs with walking, [ ]  Pain in legs at rest, [ ]  Pain in legs at night,  [x ] Non-healing ulcer, [x ] Hx Blood clot in vein/DVT,   Pulmonary: [ ]  Home oxygen, [ ]  Productive cough, [ ]  Coughing up blood, [ ]  Asthma,  [ ]  Wheezing [ ]  COPD Musculoskeletal:  [ ]  Arthritis, [ ]  Low back pain, [ ]  Joint pain Hematologic: [ ]  Easy Bruising, [x ] Anemia; [ ]  Hepatitis Gastrointestinal: [ ]  Blood in stool, [ ]  Gastroesophageal Reflux/heartburn, Urinary: x[ ]  chronic Kidney disease, [ ]  on HD - [ ]  MWF or [ ]  TTHS, [ ]  Burning with urination, [ ]  Difficulty urinating Skin: [ ]  Rashes, x[ ]  Wounds Psychological: [ ]  Anxiety, [ ]  Depression  Physical Examination Vitals:   07/31/18 0448 07/31/18 1054 07/31/18 1124 07/31/18 1125  BP:  125/83  119/66  Pulse:  100  94  Resp:  20  17  Temp:  100 F (37.8 C) 99.3 F (37.4 C)   TempSrc:  Oral Oral   SpO2:  98%  99%  Weight: 61.9 kg     Height:       Body mass index is 20.75 kg/m.  General:  WDWN in NAD HENT: WNL Eyes: Pupils equal Pulmonary: normal non-labored breathing , without Rales, rhonchi,  wheezing Cardiac: RRR, without  Murmurs, rubs or gallops; Abdomen: soft, NT, no masses Skin: no rashes, ulcers noted;  no Gangrene , positive cellulitis right foot; positive right foot open wounds;   Vascular  Exam/Pulses:Palpable brachial pulses B UE   Musculoskeletal: no muscle wasting or atrophy; right foot TMA edema  Neurologic: A&O X 3; Appropriate Affect ;  SENSATION: normal; MOTOR FUNCTION: 5/5 Symmetric Speech is fluent/normal   Significant Diagnostic Studies: CBC Lab Results  Component Value Date   WBC 21.5 (H) 07/31/2018   HGB 6.8 (LL) 07/31/2018   HCT 20.3 (L) 07/31/2018   MCV 83.2 07/31/2018   PLT 576 (H) 07/31/2018    BMET    Component Value Date/Time   NA 132 (L) 07/31/2018 0651   K 4.4 07/31/2018 0651   CL 103 07/31/2018 0651   CO2 12  (L) 07/31/2018 0651   GLUCOSE 137 (H) 07/31/2018 0651   BUN 109 (H) 07/31/2018 0651   CREATININE 7.84 (H) 07/31/2018 0651   CREATININE 3.87 (H) 11/11/2017 1830   CALCIUM 5.6 (LL) 07/31/2018 0651   CALCIUM 8.5 09/01/2009 2241   GFRNONAA 6 (L) 07/31/2018 0651   GFRNONAA 13 (L) 11/01/2017 1715   GFRAA 6 (L) 07/31/2018 0651   GFRAA 16 (L) 11/01/2017 1715   Estimated Creatinine Clearance: 8.8 mL/min (A) (by C-G formula based on SCr of 7.84 mg/dL (H)).  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  Plan for OR tonight.    She is right hand dominant and would prefer to have access in her left UE if possible.  At this time we recommend she have a temp catheter placed until her fevers and leukocytosis has improved before placing a TDC and fistula verses graft in there left UE.     Roxy Horseman 07/31/2018 1:17 PM   I have seen and evaluated the patient. I agree with the PA note as documented above. 48 yo F with poorly controlled DM and CKD stage V that vascular has been asked to place AVF and TDC.  Patient right handed, no previous access.  Last vein mapping in 2019 showed right cephalic above elbow and basilic options.  Would prefer left arm access.  Repeat vein mapping pending.  Has been febrile, WBC now 22+ from right TMA infection and going to OR with podiatry.  Discussed with Dr. Justin Mend plan for IR consult and temporary catheter for now.  We can follow and place [facility] and fistula vs graft next week when foot infection resolves.   Marty Heck, MD [facility] [place] Office: [phone] Pager: [phone]

## 2018-07-31 NOTE — Progress Notes (Signed)
PROGRESS NOTE  [name]:[id number] DOB: 1970-10-28 DOA: 07/29/2018 PCP: Loyola Mast, PA-C   LOS: 2 days   Brief Narrative / Interim history: 48 year old female with history of poorly controlled diabetes, multiple diabetic foot wants requiring multiple amputations, hypertension, hyperlipidemia who came into wound care on 4/28 with worsening right foot pain as well as fever and chills.  She was suspected to have an infection about a week ago and was placed on outpatient doxycycline but despite that her right foot continued to get worse.  She underwent an MRI in the ED which showed tenosynovitis/osteomyelitis, orthopedic surgery was consulted and patient was admitted to the hospital.  Subjective: States that her foot pain has improved but at times it gets worse.  Denies any chest pain, denies any shortness of breath.  Reports decreased urine output overnight.  Assessment & Plan: Active Problems:   Osteomyelitis of right foot (HCC)  Principal Problem Sepsis due to tenosynovitis and osteomyelitis of the right lower foot -Orthopedic surgery consulted, discussed with Dr. Erlinda Hong, patient would prefer to have her care by [name] with podiatry, orthopedic surgery discussed directly with podiatry and they will assume care.   -Plan for operative debridement later today -Placed on vancomycin and meropenem, continue, she still has a temp of 100 this morning and tachycardic at times.  She will need source control as her sepsis physiology persists  Active Problems Acute kidney injury on chronic kidney disease stage IV /metabolic acidosis -Baseline creatinine about 3.5, patient is seeing Dr. Justin Mend in office but has not seen him in a long time.  Creatinine was in the 7 range on admission and bicarb of 7, she was placed on sodium bicarb infusion -Renal ultrasound without evidence of hydronephrosis -Unfortunately renal function continues to get worse, likely in the setting of poorly controlled  diabetes as well as sepsis, consulted nephrology today  Chronic cough -Antibiotics as above, she was checked for Covid which was negative -Symptomatic management  Poorly controlled diabetes -Continue her home insulin pump, this educator consulted  Chronic anemia of renal disease -No signs of bleeding however her hemoglobin dipped down to 6.8 which is likely multifactorial also in the setting of sepsis along with her renal disease.  Transfuse unit of packed red blood cells perioperative  Hypertension -Continue home amlodipine, hold Lasix  Hyperlipidemia -Continue statin   Scheduled Meds:  sodium chloride   Intravenous Once   amLODipine  5 mg Oral QHS   Chlorhexidine Gluconate Cloth  6 each Topical Q0600   docusate sodium  100 mg Oral BID   heparin  5,000 Units Subcutaneous Q8H   insulin pump   Subcutaneous TID AC, HS, 0200   loratadine  10 mg Oral QHS   mupirocin ointment  1 application Nasal BID   pravastatin  40 mg Oral Daily   senna  1 tablet Oral BID   sodium chloride flush  3 mL Intravenous Q12H   vancomycin variable dose per unstable renal function (pharmacist dosing)   Does not apply See admin instructions   Continuous Infusions:  meropenem (MERREM) IV 1 g (07/31/18 1029)    sodium bicarbonate  infusion 1000 mL 100 mL/hr at 07/31/18 1030   vancomycin     PRN Meds:.acetaminophen **OR** acetaminophen, albuterol, benzonatate, calcium carbonate, guaiFENesin-dextromethorphan, oxyCODONE  DVT prophylaxis: heparin Code Status: Full code Family Communication: no family at bedside  Disposition Plan: home when ready   Consultants:   Podiatry   Procedures:   None   Antimicrobials:  Vancomycin / Meropenem 4/29 >>   Objective: Vitals:   07/31/18 0448 07/31/18 1054 07/31/18 1124 07/31/18 1125  BP:  125/83  119/66  Pulse:  100  94  Resp:  20  17  Temp:  100 F (37.8 C) 99.3 F (37.4 C)   TempSrc:  Oral Oral   SpO2:  98%  99%  Weight: 61.9 kg      Height:        Intake/Output Summary (Last 24 hours) at 07/31/2018 1206 Last data filed at 07/31/2018 0522 Gross per 24 hour  Intake 1531.59 ml  Output --  Net 1531.59 ml   Filed Weights   07/29/18 1456 07/29/18 2357 07/31/18 0448  Weight: 56.7 kg 57.7 kg 61.9 kg    Examination:  Constitutional: NAD, calm, comfortable Vitals:   07/31/18 0448 07/31/18 1054 07/31/18 1124 07/31/18 1125  BP:  125/83  119/66  Pulse:  100  94  Resp:  20  17  Temp:  100 F (37.8 C) 99.3 F (37.4 C)   TempSrc:  Oral Oral   SpO2:  98%  99%  Weight: 61.9 kg     Height:       Eyes: PERRL, lids and conjunctivae normal ENMT: Mucous membranes are moist.  Neck: normal, supple Respiratory: clear to auscultation bilaterally, no wheezing, no crackles. Normal respiratory effort. No accessory muscle use.  Cardiovascular: Regular rate and rhythm, no murmurs / rubs / gallops. No extremity edema. 2+ pedal pulses.  Abdomen: no tenderness, no masses palpated. Bowel sounds positive.  Skin: no rashes, lesions, ulcers. S/p all toes amputation right foot Neurologic: CN 2-12 grossly intact. Strength 5/5 in all 4.  Psychiatric: Normal judgment and insight. Alert and oriented x 3. Normal mood.   Data Reviewed: I have independently reviewed following labs and imaging studies   CBC: Recent Labs  Lab 07/29/18 1557 07/30/18 0104 07/31/18 0300  WBC 24.7* 27.0* 21.5*  NEUTROABS 22.8*  --   --   HGB 8.5* 8.2* 6.8*  HCT 26.8* 25.3* 20.3*  MCV 88.2 86.3 83.2  PLT 528* 597* 947*   Basic Metabolic Panel: Recent Labs  Lab 07/29/18 1802 07/30/18 0104 07/31/18 0300 07/31/18 0651  NA 133* 135 129* 132*  K 4.5 4.5 6.1* 4.4  CL 113* 109 103 103  CO2 <7* 7* 10* 12*  GLUCOSE 61* 147* 85 137*  BUN 101* 102* 111* 109*  CREATININE 7.00* 7.23* 7.74* 7.84*  CALCIUM 6.2* 6.8* 5.4* 5.6*  MG  --  1.3*  --   --   PHOS  --  5.9*  --   --    GFR: Estimated Creatinine Clearance: 8.8 mL/min (A) (by C-G formula based on  SCr of 7.84 mg/dL (H)). Liver Function Tests: Recent Labs  Lab 07/29/18 1802 07/31/18 0300  AST 16 27  ALT 18 19  ALKPHOS 249* 198*  BILITOT 0.4 1.8*  PROT 6.3* 5.4*  ALBUMIN 2.1* 1.8*   No results for input(s): LIPASE, AMYLASE in the last 168 hours. No results for input(s): AMMONIA in the last 168 hours. Coagulation Profile: Recent Labs  Lab 07/30/18 0104  INR 1.4*   Cardiac Enzymes: No results for input(s): CKTOTAL, CKMB, CKMBINDEX, TROPONINI in the last 168 hours. BNP (last 3 results) No results for input(s): PROBNP in the last 8760 hours. HbA1C: Recent Labs    07/30/18 0110  HGBA1C 10.4*   CBG: Recent Labs  Lab 07/30/18 2011 07/31/18 0006 07/31/18 0420 07/31/18 0602 07/31/18 0801  GLUCAP 151* 129* 65*  95 122*   Lipid Profile: No results for input(s): CHOL, HDL, LDLCALC, TRIG, CHOLHDL, LDLDIRECT in the last 72 hours. Thyroid Function Tests: Recent Labs    07/30/18 0104  TSH 2.021   Anemia Panel: Recent Labs    07/30/18 0104  VITAMINB12 788  FERRITIN 232  TIBC 209*  IRON 7*   Urine analysis:    Component Value Date/Time   COLORURINE YELLOW 07/29/2018 1524   APPEARANCEUR HAZY (A) 07/29/2018 1524   LABSPEC 1.010 07/29/2018 1524   PHURINE 5.0 07/29/2018 1524   GLUCOSEU >=500 (A) 07/29/2018 1524   GLUCOSEU 500 (A) 01/26/2015 1200   HGBUR SMALL (A) 07/29/2018 1524   BILIRUBINUR NEGATIVE 07/29/2018 [place] 07/29/2018 1524   PROTEINUR >=300 (A) 07/29/2018 1524   UROBILINOGEN 0.2 01/26/2015 1200   NITRITE POSITIVE (A) 07/29/2018 1524   LEUKOCYTESUR SMALL (A) 07/29/2018 1524   Sepsis Labs: Invalid input(s): PROCALCITONIN, LACTICIDVEN  Recent Results (from the past 240 hour(s))  Blood culture (routine x 2)     Status: None (Preliminary result)   Collection Time: 07/29/18  3:24 PM  Result Value Ref Range Status   Specimen Description   Final    BLOOD BLOOD RIGHT FOREARM Performed at [facility]  [address], [place], [place] [address]    Special Requests   Final    BOTTLES DRAWN AEROBIC AND ANAEROBIC Blood Culture adequate volume Performed at [facility] [address] [place], [place] [address]    Culture   Final    NO GROWTH 2 DAYS Performed at [facility] [address], [place], [place] [address]    Report Status PENDING  Incomplete  SARS Coronavirus 2 [facility] order, Performed in [facility] hospital lab)     Status: None   Collection Time: 07/29/18  3:57 PM  Result Value Ref Range Status   SARS Coronavirus 2 NEGATIVE NEGATIVE Final    Comment: (NOTE) If result is NEGATIVE SARS-CoV-2 target nucleic acids are NOT DETECTED. The SARS-CoV-2 RNA is generally detectable in upper and lower  respiratory specimens during the acute phase of infection. The lowest  concentration of SARS-CoV-2 viral copies this assay can detect is 250  copies / mL. A negative result does not preclude SARS-CoV-2 infection  and should not be used as the sole basis for treatment or other  patient management decisions.  A negative result may occur with  improper specimen collection / handling, submission of specimen other  than nasopharyngeal swab, presence of viral mutation(s) within the  areas targeted by this assay, and inadequate number of viral copies  (<250 copies / mL). A negative result must be combined with clinical  observations, patient history, and epidemiological information. If result is POSITIVE SARS-CoV-2 target nucleic acids are DETECTED. The SARS-CoV-2 RNA is generally detectable in upper and lower  respiratory specimens dur ing the acute phase of infection.  Positive  results are indicative of active infection with SARS-CoV-2.  Clinical  correlation with patient history and other diagnostic information is  necessary to determine patient infection status.  Positive results do  not rule out bacterial infection or co-infection with other viruses. If result  is PRESUMPTIVE POSTIVE SARS-CoV-2 nucleic acids MAY BE PRESENT.   A presumptive positive result was obtained on the submitted specimen  and confirmed on repeat testing.  While 2019 novel coronavirus  (SARS-CoV-2) nucleic acids may be present in the submitted sample  additional confirmatory testing may be necessary for epidemiological  and / or clinical management purposes  to differentiate between  SARS-CoV-2 and other Sarbecovirus currently known to infect humans.  If clinically indicated additional testing with an alternate test  methodology [phone]) is advised. The SARS-CoV-2 RNA is generally  detectable in upper and lower respiratory sp ecimens during the acute  phase of infection. The expected result is Negative. Fact Sheet for Patients:  StrictlyIdeas.no Fact Sheet for Healthcare Providers: BankingDealers.co.za This test is not yet approved or cleared by the [place] FDA and has been authorized for detection and/or diagnosis of SARS-CoV-2 by FDA under an Emergency Use Authorization (EUA).  This EUA will remain in effect (meaning this test can be used) for the duration of the COVID-19 declaration under Section 564(b)(1) of the Act, 21 U.S.C. section 360bbb-3(b)(1), unless the authorization is terminated or revoked sooner. Performed at [facility], [place] [address], [place], [place] [address]   MRSA PCR Screening     Status: Abnormal   Collection Time: 07/30/18  8:28 AM  Result Value Ref Range Status   MRSA by PCR POSITIVE (A) NEGATIVE Final    Comment:        The GeneXpert MRSA Assay (FDA approved for NASAL specimens only), is one component of a comprehensive MRSA colonization surveillance program. It is not intended to diagnose MRSA infection nor to guide or monitor treatment for MRSA infections. RESULT CALLED TO, READ BACK BY AND VERIFIED WITH: A. Lovena Le RN 11:15 07/30/18 ([email or web address]) Performed at  [facility], [place] [address], [place], [place] [address]   Respiratory Panel by PCR     Status: None   Collection Time: 07/30/18  8:29 AM  Result Value Ref Range Status   Adenovirus NOT DETECTED NOT DETECTED Final   Coronavirus 229E NOT DETECTED NOT DETECTED Final    Comment: (NOTE) The Coronavirus on the Respiratory Panel, DOES NOT test for the novel  Coronavirus (2019 nCoV)    Coronavirus HKU1 NOT DETECTED NOT DETECTED Final   Coronavirus NL63 NOT DETECTED NOT DETECTED Final   Coronavirus OC43 NOT DETECTED NOT DETECTED Final   Metapneumovirus NOT DETECTED NOT DETECTED Final   Rhinovirus / Enterovirus NOT DETECTED NOT DETECTED Final   Influenza A NOT DETECTED NOT DETECTED Final   Influenza B NOT DETECTED NOT DETECTED Final   Parainfluenza Virus 1 NOT DETECTED NOT DETECTED Final   Parainfluenza Virus 2 NOT DETECTED NOT DETECTED Final   Parainfluenza Virus 3 NOT DETECTED NOT DETECTED Final   Parainfluenza Virus 4 NOT DETECTED NOT DETECTED Final   Respiratory Syncytial Virus NOT DETECTED NOT DETECTED Final   Bordetella pertussis NOT DETECTED NOT DETECTED Final   Chlamydophila pneumoniae NOT DETECTED NOT DETECTED Final   Mycoplasma pneumoniae NOT DETECTED NOT DETECTED Final    Comment: Performed at [facility], [place]. [address], [place], [place] [address]      Radiology Studies: US Renal  Result Date: 07/30/2018 CLINICAL DATA:  Initial evaluation for acute renal injury. History of diabetes. EXAM: RENAL / URINARY TRACT ULTRASOUND COMPLETE COMPARISON:  Prior CT from 02/01/2014 FINDINGS: Right Kidney: Renal measurements: 9.0 x 4.4 x 4.6 cm = volume: 94.9 mL. Increased echogenicity within the renal parenchyma. No mass or hydronephrosis visualized. Left Kidney: Renal measurements: 8.3 x 4.5 x 4.7 cm = volume: 93.9 mL. Increased echogenicity within the renal parenchyma. No mass or hydronephrosis visualized. Bladder: Mild circumferential bladder wall thickening, likely related  incomplete distension. IMPRESSION: 1. Increased echogenicity within the renal parenchyma, compatible with medical renal  disease. 2. No hydronephrosis. Electronically Signed   By: Jeannine Boga M.D.   On: 07/30/2018 01:37   Mr Ankle Right Wo Contrast  Result Date: 07/29/2018 CLINICAL DATA:  Pain and swelling of the right ankle. Previous amputation of the foot at the level of the bases of the metatarsals. EXAM: MRI OF THE RIGHT ANKLE WITHOUT CONTRAST TECHNIQUE: Multiplanar, multisequence MR imaging of the ankle was performed. No intravenous contrast was administered. COMPARISON:  MRI dated 05/05/2018 and radiographs dated 08/06/2017 FINDINGS: TENDONS Peroneal: There is a tiny amount of fluid in the peroneus longus tendon sheath, unchanged since the prior study. Posteromedial: There is increased fluid in the sheath of the posterior tibialis tendon just distal to the tip of the medial malleolus but this is minimal. Anterior: There is tenosynovitis involving the stumps of the tibialis anterior tendon and extensor hallucis longus tendon. The fluid, likely pus, extends from the stumps of the tendons into the first tarsal metatarsal joint and around the base of the first metatarsal. Achilles: Normal. Plantar Fascia: Normal. LIGAMENTS Lateral: Intact. Medial: Intact. CARTILAGE Ankle Joint: No significant abnormality. Subtalar Joints/Sinus Tarsi: Progressive arthritis of the posterior facets of the subtalar joint. Ligaments of the sinus tarsi are intact. Bones: The patient has developed patchy mottled signal abnormalities in bases of the first and second metatarsal stumps and to a lesser degree in the bases of the third, fourth and fifth metatarsal stumps. There is also new patchy signal abnormality in the medial and middle cuneiforms as well as in the navicular and to a lesser degree in the lateral cuneiform and cuboid. Other: Diffuse subcutaneous edema around the ankle and in the foot. IMPRESSION: 1. Findings  consistent with infectious tenosynovitis of the anterior tibialis tendon and extensor hallucis longus tendon with pus extending into the first tarsal metatarsal joint. Probable osteomyelitis involving the medial cuneiform and bases of the first and second metatarsals. I suspect there is less severe osteomyelitis involving the other metatarsal bases, the middle and lateral cuneiforms, and the navicular and possibly the cuboid. Electronically Signed   By: Lorriane Shire M.D.   On: 07/29/2018 20:27   Dg Chest Portable 1 View  Result Date: 07/29/2018 CLINICAL DATA:  Fever for 5 days, nonproductive cough, diarrhea, nausea EXAM: PORTABLE CHEST 1 VIEW COMPARISON:  Portable exam 1518 hours compared to 05/04/2017 FINDINGS: Normal heart size, mediastinal contours, and pulmonary vascularity. Peribronchial thickening, new. No acute infiltrate, pleural effusion, or pneumothorax. Bones appear demineralized. IMPRESSION: Bronchitic changes without infiltrate. Electronically Signed   By: Lavonia Dana M.D.   On: 07/29/2018 16:15   Dg Foot Complete Right  Result Date: 07/30/2018 CLINICAL DATA:  Osteomyelitis.  Right foot infection. EXAM: RIGHT FOOT COMPLETE - 3+ VIEW COMPARISON:  None. FINDINGS: The patient has had forefoot amputation at the level of the bases of the metatarsals. There is a small area of the erosion of the cortex of stump of the fifth metatarsal seen only on the lateral view. There is soft tissue swelling of the stump and on the dorsum of the foot and anterior aspect of the ankle. No fractures. IMPRESSION: Possible focal osteomyelitis of the stump of the fifth metatarsal. Soft tissue swelling. Electronically Signed   By: Lorriane Shire M.D.   On: 07/30/2018 08:47    Marzetta Board, MD, PhD [facility]  Contact via  [email or web address]  [place] P: [phone]  F: [phone]

## 2018-07-31 NOTE — Progress Notes (Signed)
CRITICAL VALUE ALERT  Critical Value:  Calcium 5.4      Date & Time Notied:  4/30 /20 1027  Provider Notified: Tylene Fantasia, NP    Orders Received/Actions taken: awaiting orders

## 2018-07-31 NOTE — Progress Notes (Addendum)
Pharmacy Antibiotic Note  [name] is a 48 y.o. female admitted on 07/29/2018 with osteomyeltis.  Pharmacy has been consulted for Meropenem and Vancomycin dosing.  Vancomycin random level this AM = 16  Cultures negative to date  Plan: Meropenem 1 gram iv Q 24 hours Repeat Vancomycin 1 gram iv x 1 today F/u scr/level for additional dose F/u cultures  Height: 5\' 8"  (172.7 cm) Weight: 136 lb 7.4 oz (61.9 kg) IBW/kg (Calculated) : 63.9  Temp (24hrs), Avg:98.6 F (37 C), Min:97.8 F (36.6 C), Max:99.6 F (37.6 C)  Recent Labs  Lab 07/29/18 1557 07/29/18 1802 07/30/18 0104 07/31/18 0300 07/31/18 0651  WBC 24.7*  --  27.0* 21.5*  --   CREATININE  --  7.00* 7.23* 7.74* 7.84*  LATICACIDVEN 1.6  --   --   --   --   VANCORANDOM  --   --   --  16  --     Estimated Creatinine Clearance: 8.8 mL/min (A) (by C-G formula based on SCr of 7.84 mg/dL (H)).    Allergies  Allergen Reactions  . Cleocin [Clindamycin Hcl] Diarrhea  . Lisinopril Other (See Comments)    Elevated potassium per pt report  . Amoxicillin Diarrhea, Nausea Only and Other (See Comments)    Has patient had a PCN reaction causing immediate rash, facial/tongue/throat swelling, SOB or lightheadedness with hypotension: No Has patient had a PCN reaction causing severe rash involving mucus membranes or skin necrosis: No Has patient had a PCN reaction that required hospitalization: No Has patient had a PCN reaction occurring within the last 10 years: No If all of the above answers are "NO", then may proceed with Cephalosporin use.   . Bactrim [Sulfamethoxazole-Trimethoprim] Diarrhea and Nausea Only      Thank you for allowing pharmacy to be a part of this patient's care. Anette Guarneri, PharmD [phone] 07/31/2018 9:15 AM

## 2018-07-31 NOTE — Anesthesia Preprocedure Evaluation (Addendum)
Anesthesia Evaluation  Patient identified by MRN, date of birth, ID band Patient awake    Reviewed: Allergy & Precautions, NPO status , Patient's Chart, lab work & pertinent test results  Airway Mallampati: II  TM Distance: >3 FB Neck ROM: Full    Dental no notable dental hx.    Pulmonary sleep apnea , former smoker,    Pulmonary exam normal breath sounds clear to auscultation       Cardiovascular hypertension, Pt. on medications + Peripheral Vascular Disease and + DVT  Normal cardiovascular exam Rhythm:Regular Rate:Normal     Neuro/Psych neg Seizures Anxiety  Neuromuscular disease    GI/Hepatic negative GI ROS, Neg liver ROS,   Endo/Other  diabetes, Poorly Controlled, Insulin Dependent  Renal/GU CRFRenal disease     Musculoskeletal negative musculoskeletal ROS (+)   Abdominal   Peds  Hematology  (+) anemia , HLD   Anesthesia Other Findings Osteomyelitis  Right foot infection  Reproductive/Obstetrics                            Anesthesia Physical Anesthesia Plan  ASA: III  Anesthesia Plan: General   Post-op Pain Management:    Induction: Intravenous and Rapid sequence  PONV Risk Score and Plan: 3 and Ondansetron, Dexamethasone, Midazolam and Treatment may vary due to age or medical condition  Airway Management Planned: Oral ETT  Additional Equipment:   Intra-op Plan:   Post-operative Plan: Extubation in OR  Informed Consent: I have reviewed the patients History and Physical, chart, labs and discussed the procedure including the risks, benefits and alternatives for the proposed anesthesia with the patient or authorized representative who has indicated his/her understanding and acceptance.     Dental advisory given  Plan Discussed with: CRNA  Anesthesia Plan Comments:         Anesthesia Quick Evaluation

## 2018-07-31 NOTE — Consult Note (Signed)
Podiatry Consultation  HPI: 48 year old female h/o uncontrolled type 1 DM, h/o multiple diabetic foot wounds requiring multiple amputations was seen at bedside evening of 07/30/2018 for evaluation of cellulitis with abscess to the right foot.I was contacted by Dr. Erlinda Hong, the patient is requesting that I manage her current infection to the right foot.  MRI taken on 07/29/2018 consistent with purulent abscess and cellulitis to the right foot.  Findings also consistent with osteomyelitis, likely chronic, diffusely throughout the foot.  At one point she was scheduled for below-knee amputation however she refused amputation at that time.  Past Medical History:  Diagnosis Date  . Anemia   . Arthritis   . Bronchitis   . C. difficile diarrhea 09/26/2014  . Cataracts, both eyes   . Cellulitis of right foot 06/02/2014  . CKD (chronic kidney disease) stage 3, GFR 30-59 ml/min (HCC)    sees Dr Justin Mend- Stage IV  . Diabetic ulcer of right foot ([place]) 06/02/2014  . DVT (deep venous thrombosis) ([place]) 10/2014   one in each one in leg- PICC line , one in right and left arm  . H/O seasonal allergies   . Heart murmur    told once when she was pregnant- no furter mention- was in notes 11/2015- had Echo 8 /4/17  . History of blood transfusion   . Hypercholesteremia   . Hypertension   . Left foot infection   . Neuropathy    feet  . Neuropathy in diabetes ([place])   . Peripheral vascular disease ([place])   . Sleep apnea    not able to use CPAP  . Staphylococcus aureus bacteremia 10/2014  . Type 1 diabetes ([place])    onset age 64     Physical Exam: General: The patient is alert and oriented x3 in no acute distress.  Dermatology: no open wounds noted to the right foot or anklewith exception of some superficial sloughing of the skin to the dorsum of the right ankle localized to 2 small areas.  Vascular: Heavy erythema and edema noted right foot and ankle  Neurological: Epicritic and protective threshold absent  bilaterally.   Musculoskeletal Exam: transmetatarsal amputation right foot   MRI impression 07/29/2018:  1. Findings consistent with infectious tenosynovitis of the anterior tibialis tendon and extensor hallucis longus tendon with pus extending into the first tarsal metatarsal joint. Probable osteomyelitis involving the medial cuneiform and bases of the first and second metatarsals. I suspect there is less severe osteomyelitis involving the other metatarsal bases, the middle and lateral cuneiforms, and the navicular and possibly the cuboid.  Assessment: 1. Localized abscess right foot 2.  Chronic osteomyelitis diffusely throughout the right foot 3. Type 1 DM - poorly controlled 4.  Cellulitis right foot and ankle   Plan of Care:  1. Patient evaluated. X-Rays and MRI reviewed.  2. Patient scheduled for surgical incision and drainage to the right foot and ankle scheduled tentatively for 5 PM today, 07/31/2018.  Nothing by mouth after full breakfast 3. Regarding osteomyelitis, the patient is high risk for below-knee amputation.  Patient is currently not amenable to that treatment plan at the moment.  She will continue limb salvage consisting of I&D only. 4. I will continue to follow while inpatient       Edrick Kins, DPM [facility]  Dr. Edrick Kins, DPM    [address]  [place], [place] [address]                Office [phone]  Fax [phone]

## 2018-07-31 NOTE — Op Note (Signed)
OPERATIVE REPORT Patient name: [name] MRN: [id number] DOB: 1970/11/01  DOS:  07/31/2018 @ 1914  Preop Dx: abscess RT foot. Osteomyelitis RT foot. Cellulitis RT foot Postop Dx: same  Procedure:  1. Incision and drainage RT foot abscess  Surgeon: Edrick Kins DPM  Anesthesia: General anesthesia  Hemostasis: None EBL: 50 mL Materials: none Injectables: 73mL 0.5% bupivicaine plain Pathology: Cultures RT foot abscess sent for aerobic, anaerobic, and gram stain  Condition: The patient tolerated the procedure and anesthesia well. No complications noted or reported   Justification for procedure: The patient is a 48 y.o. Type I poorly controlled diabetic who presents today for surgical correction of cellulitis with an abscess to the right foot. The patient consented for surgical correction. The patient consent form was reviewed. All patient questions were answered. No guarantees were expressed or implied. The patient and the surgeon both signed the patient consent form with the witness present and placed in the patient's chart.   Procedure in Detail: The patient was brought to the operating room, placed in the operating table in the supine position at which time an aseptic scrub and drape were performed about the patient's respective lower extremity after anesthesia was induced as described above. Attention was then directed to the surgical area where procedure number one commenced.  Procedure #1: Incision and drainage right foot A 10 cm curvilinear skin incision was planned and made over the dorsal medial aspect of the patient's right foot stump and ankle.  After careful sharp dissection into the subcutaneous tissue there is an extensive amount of purulent drainage noted.  Blunt soft tissue dissection was utilized to expose all compartments of the foot and any purulent drainage was expressed.  There was an extensive amount of necrotic nonviable tissue noted secondary to active  infection.  Necrotic portions of bone noted consistent with MRI findings of osteomyelitis.  Purulent drainage and infection traveled proximal above the level of the ankle joint to the distal one third of the leg.  Blunt dissection was utilized, also opening the ankle joint and draining any purulent drainage.  Deep abscess cultures were taken and sent to pathology for culture and sensitivities.  After appropriate blunt dissection with expression of any purulent drainage pulse lavage was utilized for copious irrigation using 3 L of normal saline. After copious irrigation saline wet to dry gauze and dry sterile dressing was applied to the lower extremity.  Incision was left open for drainage.  Foot was dressed with 4 x 4 gauze, large Kerlix, ABD pad, and 4" Ace wrap.  The patient was then transferred from the operating room to the recovery room having tolerated the procedure and anesthesia well. All vital signs are stable. After a brief stay in the recovery room the patient was readmitted to inpatient room with adequate prescriptions for analgesia.   Intraoperative Impression Patient would greatly benefit from below-knee amputation.  Necrotic tissue was extensive with extensive portions of necrotic bone and osteomyelitis as well, consistent with MRI findings.  Based on intraoperative findings I highly doubt the extremity is salvageable at this point. I plan on seeing the patient tomorrow afternoon to discuss the intraoperative findings and recommend below-knee amputation.  Edrick Kins, DPM [facility]  Dr. Edrick Kins, [address]  [place], [place] [address]                Office [phone]  Fax [phone]

## 2018-07-31 NOTE — Progress Notes (Signed)
Inpatient Diabetes Program Recommendations  AACE/ADA: New Consensus Statement on Inpatient Glycemic Control (2015)  Target Ranges:  Prepandial:   less than 140 mg/dL      Peak postprandial:   less than 180 mg/dL (1-2 hours)      Critically ill patients:  140 - 180 mg/dL   Lab Results  Component Value Date   GLUCAP 143 (H) 07/31/2018   HGBA1C 10.4 (H) 07/30/2018    Review of Glycemic Control  Pt took insulin pump off prior to I & D d/t not being able to read screen. Pt stated she needs her eye medicine to see clearly. Spoke with RN.  Total basal from insulin pump - 15.8 units  Inpatient Diabetes Program Recommendations:     Lantus 12 units QHS Novolog 0-9 units tidwc and hs Novolog 3 units tidwc if pt eats > 50% meal  Continue to follow.  Thank you. Lorenda Peck, RD, LDN, CDE Inpatient Diabetes Coordinator [phone]

## 2018-07-31 NOTE — Progress Notes (Signed)
CRITICAL VALUE ALERT  Critical Value: HgB 6.8   Date & Time Notied:  07/31/18 @ 0352  Provider Notified: KIRBY  Orders Received/Actions taken: Awaiting resonse

## 2018-07-31 NOTE — Anesthesia Procedure Notes (Signed)
Procedure Name: Intubation Date/Time: 07/31/2018 6:25 PM Performed by: Lance Coon, CRNA Pre-anesthesia Checklist: Patient identified, Emergency Drugs available, Suction available, Patient being monitored and Timeout performed Patient Re-evaluated:Patient Re-evaluated prior to induction Oxygen Delivery Method: Circle system utilized Preoxygenation: Pre-oxygenation with 100% oxygen Induction Type: IV induction, Rapid sequence and Cricoid Pressure applied Laryngoscope Size: Miller and 3 Grade View: Grade I Tube type: Oral Tube size: 7.0 mm Number of attempts: 1 Airway Equipment and Method: Stylet Placement Confirmation: ETT inserted through vocal cords under direct vision,  positive ETCO2 and breath sounds checked- equal and bilateral Secured at: 22 cm Tube secured with: Tape Dental Injury: Teeth and Oropharynx as per pre-operative assessment

## 2018-07-31 NOTE — Brief Op Note (Signed)
07/31/2018  7:12 PM  PATIENT:  [name]  48 y.o. female  PRE-OPERATIVE DIAGNOSIS:  right foot abcess  POST-OPERATIVE DIAGNOSIS:  right foot abcess  PROCEDURE:  Procedure(s): IRRIGATION AND DEBRIDEMENT EXTREMITY (Right)  SURGEON:  Surgeon(s) and Role:    Edrick Kins, DPM - Primary  PHYSICIAN ASSISTANT:   ASSISTANTS: none   ANESTHESIA:   general  EBL:  60mL   BLOOD ADMINISTERED:none  DRAINS: none   LOCAL MEDICATIONS USED:  BUPIVICAINE   SPECIMEN:  Source of Specimen:  Culture abscess - aerobic/anaerobic/gram stain  DISPOSITION OF SPECIMEN:  PATHOLOGY  COUNTS:  YES  TOURNIQUET:  * No tourniquets in log *  DICTATION: .Dragon Dictation  PLAN OF CARE: Admit to inpatient   PATIENT DISPOSITION:  PACU - hemodynamically stable.   Delay start of Pharmacological VTE agent (>24hrs) due to surgical blood loss or risk of bleeding: not applicable

## 2018-07-31 NOTE — Transfer of Care (Signed)
Immediate Anesthesia Transfer of Care Note  Patient: [name]  Procedure(s) Performed: IRRIGATION AND DEBRIDEMENT EXTREMITY (Right )  Patient Location: PACU  Anesthesia Type:General  Level of Consciousness: sedated and patient cooperative  Airway & Oxygen Therapy: Patient connected to nasal cannula oxygen  Post-op Assessment: Report given to RN and Post -op Vital signs reviewed and stable  Post vital signs: Reviewed and stable  Last Vitals:  Vitals Value Taken Time  BP 104/67 07/31/2018  7:19 PM  Temp    Pulse 96 07/31/2018  7:22 PM  Resp 18 07/31/2018  7:22 PM  SpO2 95 % 07/31/2018  7:22 PM  Vitals shown include unvalidated device data.  Last Pain:  Vitals:   07/31/18 1413  TempSrc: Oral  PainSc:          Complications: none

## 2018-07-31 NOTE — Progress Notes (Signed)
Patient expressed difficulty seeing and using insulin pump. Patient is not able to use the pump herself currently. Insulin pump was removed for surgery.

## 2018-07-31 NOTE — H&P (View-Only) (Signed)
Podiatry Consultation  HPI: 48 year old female h/o uncontrolled type 1 DM, h/o multiple diabetic foot wounds requiring multiple amputations was seen at bedside evening of 07/30/2018 for evaluation of cellulitis with abscess to the right foot.I was contacted by Dr. Erlinda Hong, the patient is requesting that I manage her current infection to the right foot.  MRI taken on 07/29/2018 consistent with purulent abscess and cellulitis to the right foot.  Findings also consistent with osteomyelitis, likely chronic, diffusely throughout the foot.  At one point she was scheduled for below-knee amputation however she refused amputation at that time.  Past Medical History:  Diagnosis Date  . Anemia   . Arthritis   . Bronchitis   . C. difficile diarrhea 09/26/2014  . Cataracts, both eyes   . Cellulitis of right foot 06/02/2014  . CKD (chronic kidney disease) stage 3, GFR 30-59 ml/min (HCC)    sees Dr Justin Mend- Stage IV  . Diabetic ulcer of right foot ([place]) 06/02/2014  . DVT (deep venous thrombosis) ([place]) 10/2014   one in each one in leg- PICC line , one in right and left arm  . H/O seasonal allergies   . Heart murmur    told once when she was pregnant- no furter mention- was in notes 11/2015- had Echo 8 /4/17  . History of blood transfusion   . Hypercholesteremia   . Hypertension   . Left foot infection   . Neuropathy    feet  . Neuropathy in diabetes ([place])   . Peripheral vascular disease ([place])   . Sleep apnea    not able to use CPAP  . Staphylococcus aureus bacteremia 10/2014  . Type 1 diabetes ([place])    onset age 63     Physical Exam: General: The patient is alert and oriented x3 in no acute distress.  Dermatology: no open wounds noted to the right foot or anklewith exception of some superficial sloughing of the skin to the dorsum of the right ankle localized to 2 small areas.  Vascular: Heavy erythema and edema noted right foot and ankle  Neurological: Epicritic and protective threshold absent  bilaterally.   Musculoskeletal Exam: transmetatarsal amputation right foot   MRI impression 07/29/2018:  1. Findings consistent with infectious tenosynovitis of the anterior tibialis tendon and extensor hallucis longus tendon with pus extending into the first tarsal metatarsal joint. Probable osteomyelitis involving the medial cuneiform and bases of the first and second metatarsals. I suspect there is less severe osteomyelitis involving the other metatarsal bases, the middle and lateral cuneiforms, and the navicular and possibly the cuboid.  Assessment: 1. Localized abscess right foot 2.  Chronic osteomyelitis diffusely throughout the right foot 3. Type 1 DM - poorly controlled 4.  Cellulitis right foot and ankle   Plan of Care:  1. Patient evaluated. X-Rays and MRI reviewed.  2. Patient scheduled for surgical incision and drainage to the right foot and ankle scheduled tentatively for 5 PM today, 07/31/2018.  Nothing by mouth after full breakfast 3. Regarding osteomyelitis, the patient is high risk for below-knee amputation.  Patient is currently not amenable to that treatment plan at the moment.  She will continue limb salvage consisting of I&D only. 4. I will continue to follow while inpatient       Edrick Kins, DPM [facility]  Dr. Edrick Kins, DPM    [address]  [place], [place] [address]                Office [phone]  Fax [phone]

## 2018-07-31 NOTE — Interval H&P Note (Signed)
History and Physical Interval Note:  07/31/2018 4:26 PM  [name]  has presented today for surgery, with the diagnosis of right foot.  The various methods of treatment have been discussed with the patient and family. After consideration of risks, benefits and other options for treatment, the patient has consented to  Procedure(s): IRRIGATION AND DEBRIDEMENT EXTREMITY (Right) as a surgical intervention.  The patient's history has been reviewed, patient examined, no change in status, stable for surgery.  I have reviewed the patient's chart and labs.  Questions were answered to the patient's satisfaction.     Edrick Kins

## 2018-07-31 NOTE — Progress Notes (Signed)
[name] paged about pt HgB  6.8

## 2018-08-01 ENCOUNTER — Encounter (HOSPITAL_COMMUNITY): Payer: Self-pay | Admitting: Diagnostic Radiology

## 2018-08-01 ENCOUNTER — Inpatient Hospital Stay (HOSPITAL_COMMUNITY): Payer: Medicare Other

## 2018-08-01 DIAGNOSIS — Z872 Personal history of diseases of the skin and subcutaneous tissue: Secondary | ICD-10-CM

## 2018-08-01 DIAGNOSIS — N185 Chronic kidney disease, stage 5: Secondary | ICD-10-CM

## 2018-08-01 DIAGNOSIS — Z87891 Personal history of nicotine dependence: Secondary | ICD-10-CM

## 2018-08-01 DIAGNOSIS — Z89422 Acquired absence of other left toe(s): Secondary | ICD-10-CM

## 2018-08-01 DIAGNOSIS — E1051 Type 1 diabetes mellitus with diabetic peripheral angiopathy without gangrene: Secondary | ICD-10-CM

## 2018-08-01 DIAGNOSIS — Z8619 Personal history of other infectious and parasitic diseases: Secondary | ICD-10-CM

## 2018-08-01 HISTORY — PX: IR FLUORO GUIDE CV LINE RIGHT: IMG2283

## 2018-08-01 HISTORY — PX: IR US GUIDE VASC ACCESS RIGHT: IMG2390

## 2018-08-01 LAB — BLOOD CULTURE ID PANEL (REFLEXED)

## 2018-08-01 LAB — CBC
HCT: 25.2 % — ABNORMAL LOW (ref 36.0–46.0)
Hemoglobin: 8.6 g/dL — ABNORMAL LOW (ref 12.0–15.0)
MCH: 28.4 pg (ref 26.0–34.0)
MCHC: 34.1 g/dL (ref 30.0–36.0)
MCV: 83.2 fL (ref 80.0–100.0)
Platelets: 516 10*3/uL — ABNORMAL HIGH (ref 150–400)
RBC: 3.03 MIL/uL — ABNORMAL LOW (ref 3.87–5.11)
RDW: 13.8 % (ref 11.5–15.5)
WBC: 20.3 10*3/uL — ABNORMAL HIGH (ref 4.0–10.5)
nRBC: 0.1 % (ref 0.0–0.2)

## 2018-08-01 LAB — RENAL FUNCTION PANEL
Albumin: 1.8 g/dL — ABNORMAL LOW (ref 3.5–5.0)
Anion gap: 20 — ABNORMAL HIGH (ref 5–15)
BUN: 108 mg/dL — ABNORMAL HIGH (ref 6–20)
CO2: 12 mmol/L — ABNORMAL LOW (ref 22–32)
Calcium: 5.5 mg/dL — CL (ref 8.9–10.3)
Chloride: 103 mmol/L (ref 98–111)
Creatinine, Ser: 7.84 mg/dL — ABNORMAL HIGH (ref 0.44–1.00)
GFR calc Af Amer: 6 mL/min — ABNORMAL LOW (ref >60)
GFR calc non Af Amer: 6 mL/min — ABNORMAL LOW (ref >60)
Glucose, Bld: 156 mg/dL — ABNORMAL HIGH (ref 70–99)
Phosphorus: 7.1 mg/dL — ABNORMAL HIGH (ref 2.5–4.6)
Potassium: 4.2 mmol/L (ref 3.5–5.1)
Sodium: 135 mmol/L (ref 135–145)

## 2018-08-01 LAB — BPAM RBC
Blood Product Expiration Date: 202005042359
ISSUE DATE / TIME: 202004301056
Unit Type and Rh: 600

## 2018-08-01 LAB — TYPE AND SCREEN
ABO/RH(D): AB NEG
Antibody Screen: NEGATIVE
Unit division: 0

## 2018-08-01 LAB — GLUCOSE, CAPILLARY
Glucose-Capillary: 116 mg/dL — ABNORMAL HIGH (ref 70–99)
Glucose-Capillary: 121 mg/dL — ABNORMAL HIGH (ref 70–99)
Glucose-Capillary: 170 mg/dL — ABNORMAL HIGH (ref 70–99)
Glucose-Capillary: 198 mg/dL — ABNORMAL HIGH (ref 70–99)
Glucose-Capillary: 204 mg/dL — ABNORMAL HIGH (ref 70–99)
Glucose-Capillary: 220 mg/dL — ABNORMAL HIGH (ref 70–99)

## 2018-08-01 LAB — PARATHYROID HORMONE, INTACT (NO CA): PTH: 399 pg/mL — ABNORMAL HIGH (ref 15–65)

## 2018-08-01 MED ORDER — FENTANYL CITRATE (PF) 100 MCG/2ML IJ SOLN
INTRAMUSCULAR | Status: DC | PRN
  Administered 2018-08-01: 25 ug via INTRAVENOUS

## 2018-08-01 MED ORDER — DIPHENHYDRAMINE HCL 25 MG PO CAPS: 25.0000 mg | ORAL_CAPSULE | Freq: Four times a day (QID) | ORAL | Status: DC | PRN

## 2018-08-01 MED ORDER — SODIUM CHLORIDE 0.9% FLUSH
3.0000 mL | Freq: Two times a day (BID) | INTRAVENOUS | Status: DC
  Administered 2018-08-02 – 2018-08-07 (×7): 3 mL via INTRAVENOUS

## 2018-08-01 MED ORDER — VANCOMYCIN HCL IN DEXTROSE 750-5 MG/150ML-% IV SOLN
750.0000 mg | Freq: Once | INTRAVENOUS | Status: AC
  Administered 2018-08-01: 750 mg via INTRAVENOUS
  Filled 2018-08-01: qty 150

## 2018-08-01 MED ORDER — CHLORHEXIDINE GLUCONATE CLOTH 2 % EX PADS: 6.0000 | MEDICATED_PAD | Freq: Every day | CUTANEOUS | Status: DC

## 2018-08-01 MED ORDER — FENTANYL CITRATE (PF) 100 MCG/2ML IJ SOLN
INTRAMUSCULAR | Status: AC
  Filled 2018-08-01: qty 2

## 2018-08-01 MED ORDER — PRAVASTATIN SODIUM 40 MG PO TABS
40.0000 mg | ORAL_TABLET | Freq: Every day | ORAL | Status: DC
  Administered 2018-08-02 – 2018-08-09 (×7): 40 mg via ORAL
  Filled 2018-08-01 (×7): qty 1

## 2018-08-01 MED ORDER — DIPHENHYDRAMINE HCL 50 MG/ML IJ SOLN
25.0000 mg | Freq: Once | INTRAMUSCULAR | Status: AC
  Administered 2018-08-01: 25 mg via INTRAVENOUS
  Filled 2018-08-01: qty 1

## 2018-08-01 MED ORDER — SODIUM CHLORIDE 0.9 % IV SOLN
250.0000 mL | INTRAVENOUS | Status: DC | PRN
  Administered 2018-08-04: 19:00:00 via INTRAVENOUS

## 2018-08-01 MED ORDER — CALCIUM GLUCONATE-NACL 2-0.675 GM/100ML-% IV SOLN
2.0000 g | Freq: Once | INTRAVENOUS | Status: AC
  Administered 2018-08-01: 2000 mg via INTRAVENOUS
  Filled 2018-08-01: qty 100

## 2018-08-01 MED ORDER — LIDOCAINE HCL 1 % IJ SOLN
INTRAMUSCULAR | Status: DC | PRN
  Administered 2018-08-01: 10 mL

## 2018-08-01 MED ORDER — LIDOCAINE HCL 1 % IJ SOLN
INTRAMUSCULAR | Status: AC
  Filled 2018-08-01: qty 20

## 2018-08-01 MED ORDER — SODIUM CHLORIDE 0.9% FLUSH: 3.0000 mL | INTRAVENOUS | Status: DC | PRN

## 2018-08-01 MED ORDER — HEPARIN SODIUM (PORCINE) 1000 UNIT/ML IJ SOLN
INTRAMUSCULAR | Status: AC
  Administered 2018-08-01: 2.6 mL
  Filled 2018-08-01: qty 1

## 2018-08-01 MED ORDER — SODIUM CHLORIDE 0.9 % IV SOLN
500.0000 mg | INTRAVENOUS | Status: DC
  Filled 2018-08-01: qty 10

## 2018-08-01 MED ORDER — ONDANSETRON HCL 4 MG/2ML IJ SOLN
4.0000 mg | Freq: Four times a day (QID) | INTRAMUSCULAR | Status: DC | PRN
  Administered 2018-08-04: 4 mg via INTRAVENOUS
  Filled 2018-08-01 (×3): qty 2

## 2018-08-01 MED ORDER — CALCITRIOL 0.25 MCG PO CAPS
0.5000 ug | ORAL_CAPSULE | Freq: Every day | ORAL | Status: DC
  Administered 2018-08-02 – 2018-08-09 (×7): 0.5 ug via ORAL
  Filled 2018-08-01 (×7): qty 2

## 2018-08-01 NOTE — Progress Notes (Signed)
   Patient Status: [facility] - In-pt  Assessment and Plan: Patient in need of venous access.   Temporary dialysis catheter placement  ______________________________________________________________________   History of Present Illness: [name] is a 48 y.o. 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   Review of Systems: A 12 point ROS discussed and pertinent positives are indicated in the HPI above.  All other systems are negative.   Vital Signs: BP 111/67   Pulse 93   Temp 99.7 F (37.6 C) (Oral)   Resp (!) 28   Ht 5\' 8"  (1.727 m)   Wt 139 lb 12.4 oz (63.4 kg)   LMP 06/28/2018   SpO2 94%   BMI 21.25 kg/m   Physical Exam Vitals signs reviewed.  Musculoskeletal: Normal range of motion.  Skin:    General: Skin is warm and dry.  Neurological:     Mental Status: She is alert and oriented to person, place, and time.  Psychiatric:        Mood and Affect: Mood normal.        Behavior: Behavior normal.        Thought Content: Thought content normal.        Judgment: Judgment normal.      Imaging reviewed.   Labs:  COAGS: Recent Labs    07/30/18 0104  INR 1.4*  APTT 42*    BMP: Recent Labs    07/30/18 0104 07/31/18 0300 07/31/18 0651 07/31/18 1629 08/01/18 0249  NA 135 129* 132* 133* 135  K 4.5 6.1* 4.4 4.2 4.2  CL 109 103 103  --  103  CO2 7* 10* 12*  --  12*  GLUCOSE 147* 85 137* 136* 156*  BUN 102* 111* 109*  --  108*  CALCIUM 6.8* 5.4* 5.6*  --  5.5*  CREATININE 7.23* 7.74* 7.84*  --  7.84*  GFRNONAA 6* 6* 6*  --  6*  GFRAA 7* 7* 6*  --  6*   Scheduled for temporary dialysis catheter placement Pt is aware of procedure benefits and risks  Including but not limited to-- Infection; bleeding; vessel damage Agreeable to proceed  Consent signed and in chart  Electronically Signed: Lavonia Drafts, PA-C 08/01/2018, 2:00 PM   I spent a total of 15 minutes in face to face in clinical consultation, greater than 50% of which was counseling/coordinating care for venous access.

## 2018-08-01 NOTE — Progress Notes (Signed)
PHARMACY - PHYSICIAN COMMUNICATION CRITICAL VALUE ALERT - BLOOD CULTURE IDENTIFICATION (BCID)  [name] is an 48 y.o. female who presented to [facility] on 07/29/2018 with a chief complaint of fever/chills, with osteo of right foot.    Assessment: 1/4 MRSA, remains broadly covered for osteo now s/p I&D.    Name of physician (or Provider) Contacted: [facility]  Current antibiotics: Vancomycin, meropenem  Changes to prescribed antibiotics recommended:  No change at this time, f/u BCx results  No results found for this or any previous visit.  Bertis Ruddy 08/01/2018  6:43 AM

## 2018-08-01 NOTE — Consult Note (Signed)
[facility] for Infectious Disease    Date of Admission:  07/29/2018     Total days of antibiotics 4               Reason for Consult: MRSA Bacteremia   Referring Provider: [facility] / [employer] Primary Care Provider: Loyola Mast, PA-C   Assessment/Plan:  Ms. 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 S/p incision and drainage with multiple areas of infection with specimens obtained positive for Staphylococcus aureus and blood cultures with MRSA bacteremia.  Diabetic foot infection / osteomyelitis s/p incision and drainage - POD #1 from irrigation and debridement with [name] recommendation for BKA and will discuss. Has had multiple infections in the past with MRSA as well as polymicrobial with previously listed organisms. Will continue with vancomycin and meropenem pending culture results as she has had ESBL Klebsiella in the past. Wound care and further surgical intervention per [name].   MRSA Bacteremia - Blood cultures positive in 1/4 bottles. On vancomycin. Most likely source of infection to be her osteomyelitis and diabetic foot infection. Check TTE to rule out endocarditis. Recheck blood culture to ensure clearance of infection.  Type 1 diabetes - Poorly controlled with most recent A1c of 10.4. Will continue to be a contributing factor to re-current wounds, difficulty with wound healing, and continued infections.    Acute Kidney Injury on chronic kidney disease Stage IV - New acute kidney injury with need for starting dialysis likely the result of progressive diabetic nephropathy. Plan for IR temporary HD cath and initiation of dialysis. Nephrology following.    Principal Problem:   Osteomyelitis of right foot ([place]) Active Problems:   Type 1 diabetes mellitus with  neurological manifestations, uncontrolled ([place])   DM type 1 causing renal disease ([place])   . amLODipine  5 mg Oral QHS  . calcitRIOL  0.5 mcg Oral Daily  . calcium acetate  1,334 mg Oral TID WC  . Chlorhexidine Gluconate Cloth  6 each Topical Q0600  . darbepoetin (ARANESP) injection - NON-DIALYSIS  200 mcg Subcutaneous Q Thu-1800  . docusate sodium  100 mg Oral BID  . heparin  5,000 Units Subcutaneous Q8H  . insulin aspart  0-5 Units Subcutaneous QHS  . insulin aspart  0-9 Units Subcutaneous TID WC  . insulin aspart  3 Units Subcutaneous TID WC  . insulin glargine  12 Units Subcutaneous QHS  . loratadine  10 mg Oral QHS  . mupirocin ointment  1 application Nasal BID  . oxyCODONE  10 mg Oral Once  . senna  1 tablet Oral BID  . sodium chloride flush  3 mL Intravenous Q12H  . sodium chloride flush  3 mL Intravenous Q12H     HPI: BERNARD DONAHOO is a 48 y.o. 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 Diff (June 2016), CKD Stage III, and osteomyelitis resulting in multiple amputations who was admitted with right foot pain, redness, swelling and fever.  Ms. Coryell is known to the ID service having having had multiple diabetic foot infections starting in 2013 with MRSA infection with recurrent infection in 2016. Since that time she has had multiple cultures performed which include the following:  January 2018 >> left foot wound; MRSA, Serratia Marcescens, Streptococcus Viridans, Veillonella species  and Streptococcus Intermedius May 2018 >>right foot - Serratia marcescens  November 2019 >>  Left foot - ESBL producing Klebsiella Pneumoniae and Proteus mirabilis November 2019 >> right foot - ESBL producing Klebsiella pnuemoniae and Proteus mirabilis December 2019 >> left foot - MRSA, ESBL producing Klebsiella pneumoniae, and Enterococcus faecalis January 2020 >> left foot; MSSA  March 2020 >>right foot;  MRSA, Serratia marcescens, and Enterococcus faecalis.   Ms. Roadcap was last seen in the ID office on 12/31/17 at the completion of treatment and was monitored off antibiotics. The current episode started about 3 weeks ago and she was seen by Dr. Dellia Nims of wound care with redness and swelling of her right foot. She was placed on doxycycline which she completed as prescribed with no significant improvements. MRI of the right ankle on 07/29/18 with infectious tenosynovitis of the anterior tibialis tendon and extensor hallicus longus with pus extending into the first tarsal-metatarsal joint. There was probable osteomyelitis involving the cuneiform and bases of the first and second metatarsals.   [name] performed incision and drainage of her right foot abscess with extensive amounts of purulent drainage and necrotic tissue. The ankle joint also contained purulent drainage with specimens being sent for culture. Overall impress is that Ms. Cancel would benefit from a below-knee amputation.   Ms. Demont is POD#1 and her blood cultures drawn on admission were positive in 1/4 bottles for MRSA. Surgical specimens obtained with gram stain showing gram positive cocci and culture with Staphylococcus aureus. She has been afebrile in the past 24 hours and leukocytosis appears to be improving. [name] has plan to discuss BKA this afternoon.  Ms. Mccreadie has also indicated that she is having decreased vision that is slowly improving. She has previously used Medihoney on her wounds to help them heal.   Review of Systems: Review of Systems  Constitutional: Negative for chills, fever and weight loss.  Respiratory: Negative for cough, shortness of breath and wheezing.   Cardiovascular: Negative for chest pain.  Gastrointestinal: Negative for abdominal pain, constipation, diarrhea, nausea and vomiting.  Skin: Negative for rash.     Past Medical History:  Diagnosis Date  . Anemia   . Arthritis   .  Bronchitis   . C. difficile diarrhea 09/26/2014  . Cataracts, both eyes   . Cellulitis of right foot 06/02/2014  . CKD (chronic kidney disease) stage 3, GFR 30-59 ml/min (HCC)    sees Dr Justin Mend- Stage IV  . Diabetic ulcer of right foot ([place]) 06/02/2014  . DVT (deep venous thrombosis) ([place]) 10/2014   one in each one in leg- PICC line , one in right and left arm  . H/O seasonal allergies   . Heart murmur    told once when she was pregnant- no furter mention- was in notes 11/2015- had Echo 8 /4/17  . History of blood transfusion   . Hypercholesteremia   . Hypertension   . Left foot infection   . Neuropathy    feet  . Neuropathy in diabetes ([place])   . Peripheral vascular disease ([place])   . Sleep apnea    not able to use CPAP  . Staphylococcus aureus bacteremia 10/2014  . Type 1 diabetes [facility])    onset age 59    Social History   Tobacco Use  . Smoking status: Former Smoker    Years: 15.00    Last attempt to quit: 05/16/2008    Years since quitting: 10.2  . Smokeless tobacco: Never Used  Substance  Use Topics  . Alcohol use: Yes    Comment: Occasional  . Drug use: No    Family History  Problem Relation Age of Onset  . Hyperlipidemia Mother   . Dementia Mother     Allergies  Allergen Reactions  . Cleocin [Clindamycin Hcl] Diarrhea  . Lisinopril Other (See Comments)    Elevated potassium per pt report  . Amoxicillin Diarrhea, Nausea Only and Other (See Comments)    Has patient had a PCN reaction causing immediate rash, facial/tongue/throat swelling, SOB or lightheadedness with hypotension: No Has patient had a PCN reaction causing severe rash involving mucus membranes or skin necrosis: No Has patient had a PCN reaction that required hospitalization: No Has patient had a PCN reaction occurring within the last 10 years: No If all of the above answers are "NO", then may proceed with Cephalosporin use.   . Bactrim [Sulfamethoxazole-Trimethoprim] Diarrhea and Nausea Only     OBJECTIVE: Blood pressure 111/67, pulse 93, temperature 99.7 F (37.6 C), temperature source Oral, resp. rate (!) 28, height 5\' 8"  (1.727 m), weight 63.4 kg, last menstrual period 06/28/2018, SpO2 94 %.  Physical Exam Constitutional:      General: She is not in acute distress.    Appearance: She is well-developed.  Cardiovascular:     Rate and Rhythm: Normal rate and regular rhythm.     Heart sounds: Murmur present.  Pulmonary:     Effort: Pulmonary effort is normal.     Breath sounds: Normal breath sounds.  Musculoskeletal:     Comments: Left foot with previous surgical amputations and right foot with surgical dressing in place that is clean and dry.   Skin:    General: Skin is warm and dry.  Neurological:     Mental Status: She is alert and oriented to person, place, and time.  Psychiatric:        Mood and Affect: Mood normal.     Lab Results Lab Results  Component Value Date   WBC 20.3 (H) 08/01/2018   HGB 8.6 (L) 08/01/2018   HCT 25.2 (L) 08/01/2018   MCV 83.2 08/01/2018   PLT 516 (H) 08/01/2018    Lab Results  Component Value Date   CREATININE 7.84 (H) 08/01/2018   BUN 108 (H) 08/01/2018   NA 135 08/01/2018   K 4.2 08/01/2018   CL 103 08/01/2018   CO2 12 (L) 08/01/2018    Lab Results  Component Value Date   ALT 19 07/31/2018   AST 27 07/31/2018   ALKPHOS 198 (H) 07/31/2018   BILITOT 1.8 (H) 07/31/2018     Microbiology: Recent Results (from the past 240 hour(s))  Blood culture (routine x 2)     Status: None (Preliminary result)   Collection Time: 07/29/18  3:24 PM  Result Value Ref Range Status   Specimen Description   Final    BLOOD BLOOD RIGHT FOREARM Performed at [facility], [place] [address], [place], [place] [address]    Special Requests   Final    BOTTLES DRAWN AEROBIC AND ANAEROBIC Blood Culture adequate volume Performed at [place] [address] [place], [place] [address]    Culture  Setup Time    Final    GRAM POSITIVE COCCI ANAEROBIC BOTTLE ONLY CRITICAL RESULT CALLED TO, READ BACK BY AND VERIFIED WITH: Alinda Dooms, AT [phone] 08/01/18 BY Rush Landmark Performed at [facility], [place] [address], [place], [place] [address]    Culture Lonell Grandchild  POSITIVE COCCI  Final   Report Status PENDING  Incomplete  Blood Culture ID Panel (Reflexed)     Status: Abnormal   Collection Time: 07/29/18  3:24 PM  Result Value Ref Range Status   Enterococcus species NOT DETECTED NOT DETECTED Final   Listeria monocytogenes NOT DETECTED NOT DETECTED Final   Staphylococcus species DETECTED (A) NOT DETECTED Final    Comment: CRITICAL RESULT CALLED TO, READ BACK BY AND VERIFIED WITH: Derry Lory PHARMD, AT [phone] 08/01/18 BY D. VANHOOK    Staphylococcus aureus (BCID) DETECTED (A) NOT DETECTED Final    Comment: Methicillin (oxacillin)-resistant Staphylococcus aureus (MRSA). MRSA is predictably resistant to beta-lactam antibiotics (except ceftaroline). Preferred therapy is vancomycin unless clinically contraindicated. Patient requires contact precautions if  hospitalized. CRITICAL RESULT CALLED TO, READ BACK BY AND VERIFIED WITH: Derry Lory PHARMD, AT [phone] 08/01/18 BY D. VANHOOK    Methicillin resistance DETECTED (A) NOT DETECTED Final    Comment: CRITICAL RESULT CALLED TO, READ BACK BY AND VERIFIED WITH: Derry Lory PHARMD, AT [phone] 08/01/18 BY D. VANHOOK    Streptococcus species NOT DETECTED NOT DETECTED Final   Streptococcus agalactiae NOT DETECTED NOT DETECTED Final   Streptococcus pneumoniae NOT DETECTED NOT DETECTED Final   Streptococcus pyogenes NOT DETECTED NOT DETECTED Final   Acinetobacter baumannii NOT DETECTED NOT DETECTED Final   Enterobacteriaceae species NOT DETECTED NOT DETECTED Final   Enterobacter cloacae complex NOT DETECTED NOT DETECTED Final   Escherichia coli NOT DETECTED NOT DETECTED Final   Klebsiella oxytoca NOT DETECTED NOT DETECTED Final   Klebsiella pneumoniae NOT DETECTED NOT DETECTED Final    Proteus species NOT DETECTED NOT DETECTED Final   Serratia marcescens NOT DETECTED NOT DETECTED Final   Haemophilus influenzae NOT DETECTED NOT DETECTED Final   Neisseria meningitidis NOT DETECTED NOT DETECTED Final   Pseudomonas aeruginosa NOT DETECTED NOT DETECTED Final   Candida albicans NOT DETECTED NOT DETECTED Final   Candida glabrata NOT DETECTED NOT DETECTED Final   Candida krusei NOT DETECTED NOT DETECTED Final   Candida parapsilosis NOT DETECTED NOT DETECTED Final   Candida tropicalis NOT DETECTED NOT DETECTED Final    Comment: Performed at [facility], [place]. [address] [place], [place] [address]  SARS Coronavirus 2 [facility] order, Performed in [facility] hospital lab)     Status: None   Collection Time: 07/29/18  3:57 PM  Result Value Ref Range Status   SARS Coronavirus 2 NEGATIVE NEGATIVE Final    Comment: (NOTE) If result is NEGATIVE SARS-CoV-2 target nucleic acids are NOT DETECTED. The SARS-CoV-2 RNA is generally detectable in upper and lower  respiratory specimens during the acute phase of infection. The lowest  concentration of SARS-CoV-2 viral copies this assay can detect is 250  copies / mL. A negative result does not preclude SARS-CoV-2 infection  and should not be used as the sole basis for treatment or other  patient management decisions.  A negative result may occur with  improper specimen collection / handling, submission of specimen other  than nasopharyngeal swab, presence of viral mutation(s) within the  areas targeted by this assay, and inadequate number of viral copies  (<250 copies / mL). A negative result must be combined with clinical  observations, patient history, and epidemiological information. If result is POSITIVE SARS-CoV-2 target nucleic acids are DETECTED. The SARS-CoV-2 RNA is generally detectable in upper and lower  respiratory specimens dur ing the acute phase of infection.  Positive  results are indicative of active infection with  SARS-CoV-2.  Clinical  correlation with patient history and other diagnostic information is  necessary to determine patient infection status.  Positive results do  not rule out bacterial infection or co-infection with other viruses. If result is PRESUMPTIVE POSTIVE SARS-CoV-2 nucleic acids MAY BE PRESENT.   A presumptive positive result was obtained on the submitted specimen  and confirmed on repeat testing.  While 2019 novel coronavirus  (SARS-CoV-2) nucleic acids may be present in the submitted sample  additional confirmatory testing may be necessary for epidemiological  and / or clinical management purposes  to differentiate between  SARS-CoV-2 and other Sarbecovirus currently known to infect humans.  If clinically indicated additional testing with an alternate test  methodology [phone]) is advised. The SARS-CoV-2 RNA is generally  detectable in upper and lower respiratory sp ecimens during the acute  phase of infection. The expected result is Negative. Fact Sheet for Patients:  StrictlyIdeas.no Fact Sheet for Healthcare Providers: BankingDealers.co.za This test is not yet approved or cleared by the [place] FDA and has been authorized for detection and/or diagnosis of SARS-CoV-2 by FDA under an Emergency Use Authorization (EUA).  This EUA will remain in effect (meaning this test can be used) for the duration of the COVID-19 declaration under Section 564(b)(1) of the Act, 21 U.S.C. section 360bbb-3(b)(1), unless the authorization is terminated or revoked sooner. Performed at [facility], [place] [address] [place], [place] [address]   MRSA PCR Screening     Status: Abnormal   Collection Time: 07/30/18  8:28 AM  Result Value Ref Range Status   MRSA by PCR POSITIVE (A) NEGATIVE Final    Comment:        The GeneXpert MRSA Assay (FDA approved for NASAL specimens only), is one component of a comprehensive MRSA  colonization surveillance program. It is not intended to diagnose MRSA infection nor to guide or monitor treatment for MRSA infections. RESULT CALLED TO, READ BACK BY AND VERIFIED WITH: A. Lovena Le RN 11:15 07/30/18 ([email or web address]) Performed at [facility], [place] [address], [place], [place] [address]   Respiratory Panel by PCR     Status: None   Collection Time: 07/30/18  8:29 AM  Result Value Ref Range Status   Adenovirus NOT DETECTED NOT DETECTED Final   Coronavirus 229E NOT DETECTED NOT DETECTED Final    Comment: (NOTE) The Coronavirus on the Respiratory Panel, DOES NOT test for the novel  Coronavirus (2019 nCoV)    Coronavirus HKU1 NOT DETECTED NOT DETECTED Final   Coronavirus NL63 NOT DETECTED NOT DETECTED Final   Coronavirus OC43 NOT DETECTED NOT DETECTED Final   Metapneumovirus NOT DETECTED NOT DETECTED Final   Rhinovirus / Enterovirus NOT DETECTED NOT DETECTED Final   Influenza A NOT DETECTED NOT DETECTED Final   Influenza B NOT DETECTED NOT DETECTED Final   Parainfluenza Virus 1 NOT DETECTED NOT DETECTED Final   Parainfluenza Virus 2 NOT DETECTED NOT DETECTED Final   Parainfluenza Virus 3 NOT DETECTED NOT DETECTED Final   Parainfluenza Virus 4 NOT DETECTED NOT DETECTED Final   Respiratory Syncytial Virus NOT DETECTED NOT DETECTED Final   Bordetella pertussis NOT DETECTED NOT DETECTED Final   Chlamydophila pneumoniae NOT DETECTED NOT DETECTED Final   Mycoplasma pneumoniae NOT DETECTED NOT DETECTED Final    Comment: Performed at [facility], [place]. [address], [place], [place] [address]  Aerobic/Anaerobic Culture (surgical/deep wound)     Status: None (Preliminary result)   Collection Time: 07/31/18  6:52 PM  Result Value Ref Range Status   Specimen Description ABSCESS RIGHT FOOT  Final   Special Requests NONE  Final   Gram Stain   Final    ABUNDANT WBC PRESENT,BOTH PMN AND MONONUCLEAR ABUNDANT GRAM POSITIVE COCCI Performed at [facility], [place] [address], [place], [place] [address]    Culture MODERATE STAPHYLOCOCCUS AUREUS  Final   Report Status PENDING  Incomplete     Terri Piedra, NP [facility] [phone] Pager  08/01/2018  2:46 PM

## 2018-08-01 NOTE — Procedures (Signed)
Interventional Radiology Procedure:   Indications: ESRD and needing dialysis  Procedure: Placement of non-tunneled dialysis catheter  Findings: Right jugular Temp cath.  Tip at SVC/RA junction.    Complications: none     EBL: less than 10 ml  Plan: Catheter is ready to use.      Nancy Tomey R. Nancy Pancoast, MD  Pager: [phone]

## 2018-08-01 NOTE — Progress Notes (Signed)
Attempted echo again in PM.  Patient was not available due to another exam taking place in Interventional Radiology.  Will attempt later

## 2018-08-01 NOTE — Progress Notes (Addendum)
Pharmacy Antibiotic Note  [name] is a 48 y.o. female admitted on 07/29/2018 with osteomyeltis and MRSA bacteremia Transitioning from Vancomycin (last dose 430) to Daptomycin with worsening renal function and progression to possible hemodialysis - now back to vancomycin with HD   Plan: Continue Meropenem 1 gram iv Q 24 hours Vancomycin 750 mg iv x 1 dose after HD today Check vancomycin random level in AM Repeat blood cultures ordered   Height: 5\' 8"  (172.7 cm) Weight: 139 lb 12.4 oz (63.4 kg) IBW/kg (Calculated) : 63.9  Temp (24hrs), Avg:98.8 F (37.1 C), Min:97.3 F (36.3 C), Max:99.7 F (37.6 C)  Recent Labs  Lab 07/29/18 1557 07/29/18 1802 07/30/18 0104 07/31/18 0300 07/31/18 0651 08/01/18 0249  WBC 24.7*  --  27.0* 21.5*  --  20.3*  CREATININE  --  7.00* 7.23* 7.74* 7.84* 7.84*  LATICACIDVEN 1.6  --   --   --   --   --   VANCORANDOM  --   --   --  16  --   --     Estimated Creatinine Clearance: 9 mL/min (A) (by C-G formula based on SCr of 7.84 mg/dL (H)).    Allergies  Allergen Reactions  . Cleocin [Clindamycin Hcl] Diarrhea  . Lisinopril Other (See Comments)    Elevated potassium per pt report  . Amoxicillin Diarrhea, Nausea Only and Other (See Comments)    Has patient had a PCN reaction causing immediate rash, facial/tongue/throat swelling, SOB or lightheadedness with hypotension: No Has patient had a PCN reaction causing severe rash involving mucus membranes or skin necrosis: No Has patient had a PCN reaction that required hospitalization: No Has patient had a PCN reaction occurring within the last 10 years: No If all of the above answers are "NO", then may proceed with Cephalosporin use.   . Bactrim [Sulfamethoxazole-Trimethoprim] Diarrhea and Nausea Only      Thank you for allowing pharmacy to be a part of this patient's care. Anette Guarneri, PharmD [phone] 08/01/2018 11:06 AM

## 2018-08-01 NOTE — Progress Notes (Signed)
Bilateral upper extremity vein mapping completed. Preliminary results in Chart review CV Proc. [facility], [place] 08/01/2018, 4:45 PM

## 2018-08-01 NOTE — Progress Notes (Signed)
Pt c/o pain in right leg 8/10. Schorr, NP paged and order for oxy 10 mg once received.  When Rn walked in room to administer pain medication, pt asleep. Will continue to monitor pt.

## 2018-08-01 NOTE — Progress Notes (Signed)
CRITICAL VALUE ALERT  Critical Value:  CALCIUM 5.5  Date & Time Notied: 08/01/18 @0403   Provider Notified:SCHOOR   Orders Received/Actions taken: AWAITING RESPONSE

## 2018-08-01 NOTE — Progress Notes (Addendum)
Inpatient Diabetes Program Recommendations  AACE/ADA: New Consensus Statement on Inpatient Glycemic Control (2015)  Target Ranges:  Prepandial:   less than 140 mg/dL      Peak postprandial:   less than 180 mg/dL (1-2 hours)      Critically ill patients:  140 - 180 mg/dL   Results for DEMYAH, SMYRE" (MRN [id number]) as of 08/01/2018 08:47  Ref. Range 07/31/2018 20:38 07/31/2018 23:18 08/01/2018 00:28 08/01/2018 04:14 08/01/2018 08:36  Glucose-Capillary Latest Ref Range: 70 - 99 mg/dL 202 (H) 242 (H) 220 (H) 121 (H) 198 (H)   Review of Glycemic Control  Diabetes history: DM1 Outpatient Diabetes medications: insulin pump Current orders for Inpatient glycemic control: Lantus 12 units qhs, Novolog 0-9 units tid, Novolog 0-5 units qhs, Novolog 3 units tid meal coverage  HgbA1C - 10.4% - hx poor glycemic control Type 1 since age 48   Medtr[employer] 723 Basal rate - MN-3am - 0.5 3 am - 0.4 6 am - 0.4 11:30 am - 0.8 10 pm - 0.75  Total basal in 24 hour period 15.8 units  Bolus rate - CHO ratio 1:20 CF 70 during the day and 80 from 10 PM-8 AM Target 150, active insulin 4 hours  Inpatient Diabetes Program Recommendations:    Fasting glucose 198 mg/dl. Patient has more basal insulin via her insulin pump. Consider increasing Lantus to 15 units qhs.  Noted Novolog not given this am. High at lunchtime.   Thanks,  Tama Headings RN, MSN, BC-ADM Inpatient Diabetes Coordinator Team Pager [phone] (8a-5p)

## 2018-08-01 NOTE — Progress Notes (Signed)
Went to see patient today PM and she is currently having a procedure performed. Keep dressings CDI and I will follow up tomorrow 08/02/2018 for postoperative care.   Edrick Kins, DPM [facility]  Dr. Edrick Kins, DPM    [address], [place] [address]                Office [phone]  Fax [phone]

## 2018-08-01 NOTE — Progress Notes (Signed)
On call nurse practitioner [facility] notified about pt calcium level

## 2018-08-01 NOTE — Progress Notes (Signed)
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 S/p I&D of foot abscess yesterday  . VVS deferred on [facility] given active infection; needs Temp HD cath . Renal function no improvement with hydration; labs reviewed . BCx + for MRSA  04/30 0701 - 05/01 0700 In: 828 [I.V.:600; Blood:315] Out: 305 [Urine:300; Blood:5]  Filed Weights   07/29/18 2357 07/31/18 0448 08/01/18 0700  Weight: 57.7 kg 61.9 kg 63.4 kg    Scheduled Meds: . amLODipine  5 mg Oral QHS  . calcium acetate  1,334 mg Oral TID WC  . Chlorhexidine Gluconate Cloth  6 each Topical Q0600  . darbepoetin (ARANESP) injection - NON-DIALYSIS  200 mcg Subcutaneous Q Thu-1800  . docusate sodium  100 mg Oral BID  . heparin  5,000 Units Subcutaneous Q8H  . insulin aspart  0-5 Units Subcutaneous QHS  . insulin aspart  0-9 Units Subcutaneous TID WC  . insulin aspart  3 Units Subcutaneous TID WC  . insulin glargine  12 Units Subcutaneous QHS  . loratadine  10 mg Oral QHS  . mupirocin ointment  1 application Nasal BID  . oxyCODONE  10 mg Oral Once  . senna  1 tablet Oral BID  . sodium chloride flush  3 mL Intravenous Q12H  . sodium chloride flush  3 mL Intravenous Q12H   Continuous Infusions: . sodium chloride    . DAPTOmycin (CUBICIN)  IV    . meropenem (MERREM) IV 1 g (08/01/18 0945)  .  sodium bicarbonate  infusion 1000 mL 100 mL/hr at 07/31/18 1030   PRN Meds:.sodium chloride, acetaminophen **OR** acetaminophen, albuterol, benzonatate, calcium carbonate, guaiFENesin-dextromethorphan, ondansetron (ZOFRAN) IV, oxyCODONE, sodium chloride flush  Current Labs: reviewed  Results for MIKAYLA, CHIUSANO" (MRN [id number]) as of 08/01/2018 12:14  Ref. Range 07/31/2018 16:41  Saturation Ratios Latest Ref Range: 10.4 - 31.8 % 16  Results for TAKIRA, SHERRIN" (MRN [id number]) as of 08/01/2018 12:14  Ref. Range 07/31/2018 16:41  PTH, Intact  Latest Ref Range: 15 - 65 pg/mL 399 (H)    Physical Exam:  Blood pressure 111/67, pulse 93, temperature 99.7 F (37.6 C), temperature source Oral, resp. rate (!) 28, height 5\' 8"  (1.727 m), weight 63.4 kg, last menstrual period 06/28/2018, SpO2 94 %. NAD RRR no mgr CTAB R Foot bandaged No rashes/lesions elsewhwere nonfocal S/nt/nd  A 1. New ESRD, Diabetic Nephropathy 2. DM1 3. R Foot abscess s/p I&D 07/31/18; dapto and meropenem 4. MRSA bacteremia; daptomycin 5. 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 Anemia, normocytic 8. HTN on amlodipine  P . Consult IR for Temp HD cath . Start HD post HD Cath: 2K 3.5Ca 2.5 Qb 250, No UF, Tight heparin . Next HD tomorrow if rec HD today . CLIP pt . Start Calcitriol 0.77mcg qTx . Cont PhosLo . Follow Ca . No role for Fe with active infection . Cont ESA, started 4/30 . [facility] pendng improvement in infectious issues . Medication Issues; o Preferred narcotic agents for pain control are hydromorphone, fentanyl, and methadone. Morphine should not be used.  o Baclofen should be avoided o Avoid oral sodium phosphate and magnesium citrate based laxatives / bowel preps    Pearson Grippe MD 08/01/2018, 12:12 PM  Recent Labs  Lab 07/30/18 0104 07/31/18 0300 07/31/18 0651 07/31/18 1629 08/01/18 0249  NA 135 129* 132*  133* 135  K 4.5 6.1* 4.4 4.2 4.2  CL 109 103 103  --  103  CO2 7* 10* 12*  --  12*  GLUCOSE 147* 85 137* 136* 156*  BUN 102* 111* 109*  --  108*  CREATININE 7.23* 7.74* 7.84*  --  7.84*  CALCIUM 6.8* 5.4* 5.6*  --  5.5*  PHOS 5.9*  --   --   --  7.1*   Recent Labs  Lab 07/29/18 1557 07/30/18 0104  07/31/18 0300 07/31/18 1629 08/01/18 0249  WBC 24.7* 27.0*  --  21.5*  --  20.3*  NEUTROABS 22.8*  --   --   --   --   --   HGB 8.5* 8.2*  --  6.8* 8.2* 8.6*  HCT 26.8* 25.3*   < > 20.3* 24.0* 25.2*  MCV 88.2 86.3  --  83.2  --  83.2  PLT 528* 597*  --   576*  --  516*   < > = values in this interval not displayed.

## 2018-08-01 NOTE — Progress Notes (Signed)
Patient transported to HD dept for HD tx. AAO x4 ,in no discomforts. Report given to HD RN.

## 2018-08-01 NOTE — Progress Notes (Signed)
Attempted echo.  Patient was being attended to by nurse and required cleaning.  Will return later

## 2018-08-01 NOTE — Progress Notes (Signed)
Renal Navigator notified by Dr. Cristal Generous to start referral process for OP HD seat, however, Renal Navigator has not had opportunity to speak with patient given procedures today. Referral to be initiated once Renal Navigator is able to speak with patient. Renal Navigator is following closely for patient availability. Patient was in Vascular Laboratory and is not in IR.   Alphonzo Cruise Renal Navigator [phone]

## 2018-08-01 NOTE — Progress Notes (Signed)
PROGRESS NOTE  [name] JJO:[id number] DOB: May 21, 1970 DOA: 07/29/2018 PCP: Loyola Mast, PA-C   LOS: 3 days   Brief Narrative / Interim history: 48 year old female with history of poorly controlled diabetes, multiple diabetic foot wants requiring multiple amputations, hypertension, hyperlipidemia who came into wound care on 4/28 with worsening right foot pain as well as fever and chills.  She was suspected to have an infection about a week ago and was placed on outpatient doxycycline but despite that her right foot continued to get worse.  She underwent an MRI in the ED which showed tenosynovitis/osteomyelitis, orthopedic surgery was consulted and patient was admitted to the hospital. Patient was taken to the OR on 4/30.  Due to progressive renal failure nephrology was consulted and looks like she will require dialysis.  Vascular surgery also consulted.  Blood cultures grew MRSA and ID was consulted as well  Subjective: She feels better postop, left foot pain.  She actually has a lot of energy this morning.  She denies any chest pain, no abdominal pain, no nausea or vomiting.  Assessment & Plan: Active Problems:   Osteomyelitis of right foot (HCC)  Principal Problem Sepsis due to tenosynovitis and osteomyelitis of the right lower foot as well as MRSA bacteremia -Podiatry, [name] consulted.  Patient was taken to the operating room on 4/30 for debridement, however given extensive infection it appears that she may benefit from below the knee amputation.  This is to be discussed with Dr. Orpha Bur patient -She was initially placed on vancomycin and meropenem based on prior culture data, however blood cultures on 08/01/2018 grew MRSA.  Infectious disease was consulted.  Patient changed to Cubicin along with meropenem on 5/1 -White count improving to 20 K today  Active Problems Acute kidney injury on chronic kidney disease stage IV /metabolic acidosis -Baseline creatinine about 3.5,  patient is seeing Dr. Justin Mend in office but has not seen him in a long time.  -Nephrology consulted due to worsening creatinine and is now plans to undergo dialysis.  Vascular surgery consulted as well.  Chronic cough -Antibiotics as above, she was checked for Covid which was negative -Symptomatic management  Poorly controlled diabetes -Patient states that she ran out of supplies and they were not shipped to her in time and that is why she is poorly controlled.  Chronic anemia of renal disease -No signs of bleeding however her hemoglobin dipped down to 6.8 which is likely multifactorial also in the setting of sepsis along with her renal disease.  -She was transfused a unit of packed red blood cells on 4/30, hemoglobin stable this morning  Hypertension -Continue home amlodipine, hold Lasix  Hyperlipidemia -Continue statin   Scheduled Meds: . amLODipine  5 mg Oral QHS  . calcium acetate  1,334 mg Oral TID WC  . Chlorhexidine Gluconate Cloth  6 each Topical Q0600  . darbepoetin (ARANESP) injection - NON-DIALYSIS  200 mcg Subcutaneous Q Thu-1800  . docusate sodium  100 mg Oral BID  . heparin  5,000 Units Subcutaneous Q8H  . insulin aspart  0-5 Units Subcutaneous QHS  . insulin aspart  0-9 Units Subcutaneous TID WC  . insulin aspart  3 Units Subcutaneous TID WC  . insulin glargine  12 Units Subcutaneous QHS  . loratadine  10 mg Oral QHS  . mupirocin ointment  1 application Nasal BID  . oxyCODONE  10 mg Oral Once  . senna  1 tablet Oral BID  . sodium chloride flush  3 mL  Intravenous Q12H  . sodium chloride flush  3 mL Intravenous Q12H   Continuous Infusions: . sodium chloride    . DAPTOmycin (CUBICIN)  IV    . meropenem (MERREM) IV 1 g (08/01/18 0945)  .  sodium bicarbonate  infusion 1000 mL 100 mL/hr at 07/31/18 1030   PRN Meds:.sodium chloride, acetaminophen **OR** acetaminophen, albuterol, benzonatate, calcium carbonate, guaiFENesin-dextromethorphan, oxyCODONE, sodium chloride  flush  DVT prophylaxis: heparin Code Status: Full code Family Communication: no family at bedside  Disposition Plan: home when ready   Consultants:   Podiatry   Procedures:   None   Antimicrobials:  Vancomycin 4.29>> 5/1  Daptomycin 5/1 >>  Meropenem 4/29 >>   Objective: Vitals:   07/31/18 2031 08/01/18 0410 08/01/18 0523 08/01/18 0700  BP:      Pulse:   93   Resp:      Temp: 98.8 F (37.1 C) 99.4 F (37.4 C) 99.7 F (37.6 C)   TempSrc: Oral Oral Oral   SpO2:      Weight:    63.4 kg  Height:        Intake/Output Summary (Last 24 hours) at 08/01/2018 1154 Last data filed at 08/01/2018 0000 Gross per 24 hour  Intake 915 ml  Output 305 ml  Net 610 ml   Filed Weights   07/29/18 2357 07/31/18 0448 08/01/18 0700  Weight: 57.7 kg 61.9 kg 63.4 kg    Examination:  Constitutional: She is in no distress Vitals:   07/31/18 2031 08/01/18 0410 08/01/18 0523 08/01/18 0700  BP:      Pulse:   93   Resp:      Temp: 98.8 F (37.1 C) 99.4 F (37.4 C) 99.7 F (37.6 C)   TempSrc: Oral Oral Oral   SpO2:      Weight:    63.4 kg  Height:       Eyes: No scleral icterus ENMT: Moist membranes Neck: normal, supple Respiratory: Clear to auscultation bilaterally, no wheezing, no crackles.  Normal respiratory effort Cardiovascular: Regular rate and rhythm, no murmurs appreciated.  No peripheral edema Abdomen: Soft, nontender, nondistended, bowel sounds positive Skin: No rash seen, right foot wrapped Neurologic: No focal deficits Psychiatric: Normal judgment and insight. Alert and oriented x 3. Normal mood.   Data Reviewed: I have independently reviewed following labs and imaging studies   CBC: Recent Labs  Lab 07/29/18 1557 07/30/18 0104 07/30/18 0356 07/31/18 0300 07/31/18 1629 08/01/18 0249  WBC 24.7* 27.0*  --  21.5*  --  20.3*  NEUTROABS 22.8*  --   --   --   --   --   HGB 8.5* 8.2*  --  6.8* 8.2* 8.6*  HCT 26.8* 25.3* 23.2* 20.3* 24.0* 25.2*  MCV 88.2  86.3  --  83.2  --  83.2  PLT 528* 597*  --  576*  --  962*   Basic Metabolic Panel: Recent Labs  Lab 07/29/18 1802 07/30/18 0104 07/31/18 0300 07/31/18 0651 07/31/18 1629 08/01/18 0249  NA 133* 135 129* 132* 133* 135  K 4.5 4.5 6.1* 4.4 4.2 4.2  CL 113* 109 103 103  --  103  CO2 <7* 7* 10* 12*  --  12*  GLUCOSE 61* 147* 85 137* 136* 156*  BUN 101* 102* 111* 109*  --  108*  CREATININE 7.00* 7.23* 7.74* 7.84*  --  7.84*  CALCIUM 6.2* 6.8* 5.4* 5.6*  --  5.5*  MG  --  1.3*  --   --   --   --  PHOS  --  5.9*  --   --   --  7.1*   GFR: Estimated Creatinine Clearance: 9 mL/min (A) (by C-G formula based on SCr of 7.84 mg/dL (H)). Liver Function Tests: Recent Labs  Lab 07/29/18 1802 07/31/18 0300 08/01/18 0249  AST 16 27  --   ALT 18 19  --   ALKPHOS 249* 198*  --   BILITOT 0.4 1.8*  --   PROT 6.3* 5.4*  --   ALBUMIN 2.1* 1.8* 1.8*   No results for input(s): LIPASE, AMYLASE in the last 168 hours. No results for input(s): AMMONIA in the last 168 hours. Coagulation Profile: Recent Labs  Lab 07/30/18 0104  INR 1.4*   Cardiac Enzymes: No results for input(s): CKTOTAL, CKMB, CKMBINDEX, TROPONINI in the last 168 hours. BNP (last 3 results) No results for input(s): PROBNP in the last 8760 hours. HbA1C: Recent Labs    07/30/18 0110  HGBA1C 10.4*   CBG: Recent Labs  Lab 07/31/18 2318 08/01/18 0028 08/01/18 0414 08/01/18 0836 08/01/18 1138  GLUCAP 242* 220* 121* 198* 204*   Lipid Profile: No results for input(s): CHOL, HDL, LDLCALC, TRIG, CHOLHDL, LDLDIRECT in the last 72 hours. Thyroid Function Tests: Recent Labs    07/30/18 0104  TSH 2.021   Anemia Panel: Recent Labs    07/30/18 0104 07/31/18 1641  VITAMINB12 788  --   FERRITIN 232  --   TIBC 209* 171*  IRON 7* 27*   Urine analysis:    Component Value Date/Time   COLORURINE YELLOW 07/29/2018 1524   APPEARANCEUR HAZY (A) 07/29/2018 1524   LABSPEC 1.010 07/29/2018 1524   PHURINE 5.0 07/29/2018  1524   GLUCOSEU >=500 (A) 07/29/2018 1524   GLUCOSEU 500 (A) 01/26/2015 1200   HGBUR SMALL (A) 07/29/2018 1524   BILIRUBINUR NEGATIVE 07/29/2018 1524   KETONESUR NEGATIVE 07/29/2018 1524   PROTEINUR >=300 (A) 07/29/2018 1524   UROBILINOGEN 0.2 01/26/2015 1200   NITRITE POSITIVE (A) 07/29/2018 1524   LEUKOCYTESUR SMALL (A) 07/29/2018 1524   Sepsis Labs: Invalid input(s): PROCALCITONIN, LACTICIDVEN  Recent Results (from the past 240 hour(s))  Blood culture (routine x 2)     Status: None (Preliminary result)   Collection Time: 07/29/18  3:24 PM  Result Value Ref Range Status   Specimen Description   Final    BLOOD BLOOD RIGHT FOREARM Performed at [facility] [address] [place], [place] [address]    Special Requests   Final    BOTTLES DRAWN AEROBIC AND ANAEROBIC Blood Culture adequate volume Performed at [facility] [address] [place], [place] [address]    Culture  Setup Time   Final    GRAM POSITIVE COCCI ANAEROBIC BOTTLE ONLY CRITICAL RESULT CALLED TO, READ BACK BY AND VERIFIED WITH: Alinda Dooms, AT 4081 08/01/18 BY Rush Landmark Performed at [facility], [place] [address], [place], [place] [address]    Culture GRAM POSITIVE COCCI  Final   Report Status PENDING  Incomplete  Blood Culture ID Panel (Reflexed)     Status: Abnormal   Collection Time: 07/29/18  3:24 PM  Result Value Ref Range Status   Enterococcus species NOT DETECTED NOT DETECTED Final   Listeria monocytogenes NOT DETECTED NOT DETECTED Final   Staphylococcus species DETECTED (A) NOT DETECTED Final    Comment: CRITICAL RESULT CALLED TO, READ BACK BY AND VERIFIED WITH: Derry Lory PHARMD, AT [id number] 08/01/18 BY D. VANHOOK    Staphylococcus aureus (  BCID) DETECTED (A) NOT DETECTED Final    Comment: Methicillin (oxacillin)-resistant Staphylococcus aureus (MRSA). MRSA is predictably resistant to beta-lactam antibiotics (except ceftaroline). Preferred therapy is vancomycin  unless clinically contraindicated. Patient requires contact precautions if  hospitalized. CRITICAL RESULT CALLED TO, READ BACK BY AND VERIFIED WITH: Derry Lory PHARMD, AT [phone] 08/01/18 BY D. VANHOOK    Methicillin resistance DETECTED (A) NOT DETECTED Final    Comment: CRITICAL RESULT CALLED TO, READ BACK BY AND VERIFIED WITH: Derry Lory PHARMD, AT [phone] 08/01/18 BY D. VANHOOK    Streptococcus species NOT DETECTED NOT DETECTED Final   Streptococcus agalactiae NOT DETECTED NOT DETECTED Final   Streptococcus pneumoniae NOT DETECTED NOT DETECTED Final   Streptococcus pyogenes NOT DETECTED NOT DETECTED Final   Acinetobacter baumannii NOT DETECTED NOT DETECTED Final   Enterobacteriaceae species NOT DETECTED NOT DETECTED Final   Enterobacter cloacae complex NOT DETECTED NOT DETECTED Final   Escherichia coli NOT DETECTED NOT DETECTED Final   Klebsiella oxytoca NOT DETECTED NOT DETECTED Final   Klebsiella pneumoniae NOT DETECTED NOT DETECTED Final   Proteus species NOT DETECTED NOT DETECTED Final   Serratia marcescens NOT DETECTED NOT DETECTED Final   Haemophilus influenzae NOT DETECTED NOT DETECTED Final   Neisseria meningitidis NOT DETECTED NOT DETECTED Final   Pseudomonas aeruginosa NOT DETECTED NOT DETECTED Final   Candida albicans NOT DETECTED NOT DETECTED Final   Candida glabrata NOT DETECTED NOT DETECTED Final   Candida krusei NOT DETECTED NOT DETECTED Final   Candida parapsilosis NOT DETECTED NOT DETECTED Final   Candida tropicalis NOT DETECTED NOT DETECTED Final    Comment: Performed at [facility], [place]. [address], [place], [place] [address]  SARS Coronavirus 2 [facility] order, Performed in [facility] hospital lab)     Status: None   Collection Time: 07/29/18  3:57 PM  Result Value Ref Range Status   SARS Coronavirus 2 NEGATIVE NEGATIVE Final    Comment: (NOTE) If result is NEGATIVE SARS-CoV-2 target nucleic acids are NOT DETECTED. The SARS-CoV-2 RNA is generally detectable in  upper and lower  respiratory specimens during the acute phase of infection. The lowest  concentration of SARS-CoV-2 viral copies this assay can detect is 250  copies / mL. A negative result does not preclude SARS-CoV-2 infection  and should not be used as the sole basis for treatment or other  patient management decisions.  A negative result may occur with  improper specimen collection / handling, submission of specimen other  than nasopharyngeal swab, presence of viral mutation(s) within the  areas targeted by this assay, and inadequate number of viral copies  (<250 copies / mL). A negative result must be combined with clinical  observations, patient history, and epidemiological information. If result is POSITIVE SARS-CoV-2 target nucleic acids are DETECTED. The SARS-CoV-2 RNA is generally detectable in upper and lower  respiratory specimens dur ing the acute phase of infection.  Positive  results are indicative of active infection with SARS-CoV-2.  Clinical  correlation with patient history and other diagnostic information is  necessary to determine patient infection status.  Positive results do  not rule out bacterial infection or co-infection with other viruses. If result is PRESUMPTIVE POSTIVE SARS-CoV-2 nucleic acids MAY BE PRESENT.   A presumptive positive result was obtained on the submitted specimen  and confirmed on repeat testing.  While 2019 novel coronavirus  (SARS-CoV-2) nucleic acids may be present in the submitted sample  additional confirmatory testing may be necessary for epidemiological  and /  or clinical management purposes  to differentiate between  SARS-CoV-2 and other Sarbecovirus currently known to infect humans.  If clinically indicated additional testing with an alternate test  methodology [phone]) is advised. The SARS-CoV-2 RNA is generally  detectable in upper and lower respiratory sp ecimens during the acute  phase of infection. The expected result is  Negative. Fact Sheet for Patients:  StrictlyIdeas.no Fact Sheet for Healthcare Providers: BankingDealers.co.za This test is not yet approved or cleared by the [place] FDA and has been authorized for detection and/or diagnosis of SARS-CoV-2 by FDA under an Emergency Use Authorization (EUA).  This EUA will remain in effect (meaning this test can be used) for the duration of the COVID-19 declaration under Section 564(b)(1) of the Act, 21 U.S.C. section 360bbb-3(b)(1), unless the authorization is terminated or revoked sooner. Performed at [facility], [place] [address], [place], [place] [address]   MRSA PCR Screening     Status: Abnormal   Collection Time: 07/30/18  8:28 AM  Result Value Ref Range Status   MRSA by PCR POSITIVE (A) NEGATIVE Final    Comment:        The GeneXpert MRSA Assay (FDA approved for NASAL specimens only), is one component of a comprehensive MRSA colonization surveillance program. It is not intended to diagnose MRSA infection nor to guide or monitor treatment for MRSA infections. RESULT CALLED TO, READ BACK BY AND VERIFIED WITH: A. Lovena Le RN 11:15 07/30/18 ([email or web address]) Performed at [facility], [place] [address], [place], [place] [address]   Respiratory Panel by PCR     Status: None   Collection Time: 07/30/18  8:29 AM  Result Value Ref Range Status   Adenovirus NOT DETECTED NOT DETECTED Final   Coronavirus 229E NOT DETECTED NOT DETECTED Final    Comment: (NOTE) The Coronavirus on the Respiratory Panel, DOES NOT test for the novel  Coronavirus (2019 nCoV)    Coronavirus HKU1 NOT DETECTED NOT DETECTED Final   Coronavirus NL63 NOT DETECTED NOT DETECTED Final   Coronavirus OC43 NOT DETECTED NOT DETECTED Final   Metapneumovirus NOT DETECTED NOT DETECTED Final   Rhinovirus / Enterovirus NOT DETECTED NOT DETECTED Final   Influenza A NOT DETECTED NOT DETECTED Final   Influenza B NOT  DETECTED NOT DETECTED Final   Parainfluenza Virus 1 NOT DETECTED NOT DETECTED Final   Parainfluenza Virus 2 NOT DETECTED NOT DETECTED Final   Parainfluenza Virus 3 NOT DETECTED NOT DETECTED Final   Parainfluenza Virus 4 NOT DETECTED NOT DETECTED Final   Respiratory Syncytial Virus NOT DETECTED NOT DETECTED Final   Bordetella pertussis NOT DETECTED NOT DETECTED Final   Chlamydophila pneumoniae NOT DETECTED NOT DETECTED Final   Mycoplasma pneumoniae NOT DETECTED NOT DETECTED Final    Comment: Performed at [facility], [place]. [address] [place], [place] [address]  Aerobic/Anaerobic Culture (surgical/deep wound)     Status: None (Preliminary result)   Collection Time: 07/31/18  6:52 PM  Result Value Ref Range Status   Specimen Description ABSCESS RIGHT FOOT  Final   Special Requests NONE  Final   Gram Stain   Final    ABUNDANT WBC PRESENT,BOTH PMN AND MONONUCLEAR ABUNDANT GRAM POSITIVE COCCI Performed at [facility], [address] [place], [place] [address]    Culture MODERATE STAPHYLOCOCCUS AUREUS  Final   Report Status PENDING  Incomplete      Radiology Studies: No results found.  Marzetta Board, MD, PhD [facility]  Contact via  [email or web address]  [place] P: [phone]  F: [phone]

## 2018-08-02 ENCOUNTER — Other Ambulatory Visit (HOSPITAL_COMMUNITY): Payer: Medicare Other

## 2018-08-02 LAB — RENAL FUNCTION PANEL
Albumin: 1.5 g/dL — ABNORMAL LOW (ref 3.5–5.0)
Anion gap: 12 (ref 5–15)
BUN: 44 mg/dL — ABNORMAL HIGH (ref 6–20)
CO2: 26 mmol/L (ref 22–32)
Calcium: 7 mg/dL — ABNORMAL LOW (ref 8.9–10.3)
Chloride: 100 mmol/L (ref 98–111)
Creatinine, Ser: 4.31 mg/dL — ABNORMAL HIGH (ref 0.44–1.00)
GFR calc Af Amer: 13 mL/min — ABNORMAL LOW (ref >60)
GFR calc non Af Amer: 12 mL/min — ABNORMAL LOW (ref >60)
Glucose, Bld: 171 mg/dL — ABNORMAL HIGH (ref 70–99)
Phosphorus: 4.1 mg/dL (ref 2.5–4.6)
Potassium: 3.1 mmol/L — ABNORMAL LOW (ref 3.5–5.1)
Sodium: 138 mmol/L (ref 135–145)

## 2018-08-02 LAB — GLUCOSE, CAPILLARY
Glucose-Capillary: 103 mg/dL — ABNORMAL HIGH (ref 70–99)
Glucose-Capillary: 104 mg/dL — ABNORMAL HIGH (ref 70–99)
Glucose-Capillary: 165 mg/dL — ABNORMAL HIGH (ref 70–99)
Glucose-Capillary: 171 mg/dL — ABNORMAL HIGH (ref 70–99)
Glucose-Capillary: 215 mg/dL — ABNORMAL HIGH (ref 70–99)
Glucose-Capillary: 61 mg/dL — ABNORMAL LOW (ref 70–99)
Glucose-Capillary: 69 mg/dL — ABNORMAL LOW (ref 70–99)

## 2018-08-02 LAB — VANCOMYCIN, RANDOM: Vancomycin Rm: 32

## 2018-08-02 MED ORDER — DEXTROSE 50 % IV SOLN
INTRAVENOUS | Status: AC
  Administered 2018-08-02: 25 mL
  Filled 2018-08-02: qty 50

## 2018-08-02 MED ORDER — INSULIN ASPART 100 UNIT/ML ~~LOC~~ SOLN
1.0000 [IU] | Freq: Three times a day (TID) | SUBCUTANEOUS | Status: DC
  Administered 2018-08-02: 1 [IU] via SUBCUTANEOUS

## 2018-08-02 MED ORDER — SODIUM CHLORIDE 0.9 % IV SOLN
500.0000 mg | INTRAVENOUS | Status: DC
  Filled 2018-08-02: qty 0.5

## 2018-08-02 MED ORDER — VANCOMYCIN VARIABLE DOSE PER UNSTABLE RENAL FUNCTION (PHARMACIST DOSING): Status: DC

## 2018-08-02 MED ORDER — SODIUM CHLORIDE 0.9% FLUSH
10.0000 mL | Freq: Two times a day (BID) | INTRAVENOUS | Status: DC
  Administered 2018-08-03 – 2018-08-07 (×4): 10 mL

## 2018-08-02 MED ORDER — HEPARIN SODIUM (PORCINE) 1000 UNIT/ML IJ SOLN
INTRAMUSCULAR | Status: AC
  Filled 2018-08-02: qty 1

## 2018-08-02 NOTE — Progress Notes (Signed)
PROGRESS NOTE  [name] OIT:[id number] DOB: 28-Jul-1970 DOA: 07/29/2018  PCP: Loyola Mast, PA-C  HPI: s/p I&D right lower extremity. DOS: 07/31/2018. Patient seen while in dialysis. Denies pain to RLE. Patient states she is not feeling well overall. Dressings have been clean, dry, and intact since surgery.   Past Medical History:  Diagnosis Date  . Anemia   . Arthritis   . Bronchitis   . C. difficile diarrhea 09/26/2014  . Cataracts, both eyes   . Cellulitis of right foot 06/02/2014  . CKD (chronic kidney disease) stage 3, GFR 30-59 ml/min (HCC)    sees Dr Justin Mend- Stage IV  . Diabetic ulcer of right foot ([place]) 06/02/2014  . DVT (deep venous thrombosis) ([place]) 10/2014   one in each one in leg- PICC line , one in right and left arm  . H/O seasonal allergies   . Heart murmur    told once when she was pregnant- no furter mention- was in notes 11/2015- had Echo 8 /4/17  . History of blood transfusion   . Hypercholesteremia   . Hypertension   . Left foot infection   . Neuropathy    feet  . Neuropathy in diabetes ([place])   . Peripheral vascular disease ([place])   . Sleep apnea    not able to use CPAP  . Staphylococcus aureus bacteremia 10/2014  . Type 1 diabetes ([place])    onset age 42   CBC    Component Value Date/Time   WBC 20.3 (H) 08/01/2018 0249   RBC 3.03 (L) 08/01/2018 0249   HGB 8.6 (L) 08/01/2018 0249   HGB 10.2 (L) 12/18/2016 1544   HCT 25.2 (L) 08/01/2018 0249   HCT 23.2 (L) 07/30/2018 0356   PLT 516 (H) 08/01/2018 0249   PLT 627 (H) 12/18/2016 1544   MCV 83.2 08/01/2018 0249   MCV 86 12/18/2016 1544   MCH 28.4 08/01/2018 0249   MCHC 34.1 08/01/2018 0249   RDW 13.8 08/01/2018 0249   RDW 14.7 12/18/2016 1544   LYMPHSABS 0.5 (L) 07/29/2018 1557   LYMPHSABS 1.3 12/18/2016 1544   MONOABS 1.1 (H) 07/29/2018 1557   EOSABS 0.0 07/29/2018 1557   EOSABS 0.2 12/18/2016 1544   BASOSABS 0.0 07/29/2018 1557   BASOSABS 0.0 12/18/2016 1544     Physical Exam:  Negative for active bleeding. Extensive necrotic tissue noted. No malodor. Erythema and Edema continue to persist to the RLE.    Plan of Care:  - spoke with patient directly and recommended BKA, at which time she refuses and would like to continue to pursue limb salvage. Patient has a history of healing wounds and infection in the past and she believes she can heal again despite informing her of +MRSA, osteomyelitis, and multiple comorbidities.  - If patient wants to continue to pursue limb salvage, she will require addt'l wash out with delayed partial primary closure. We will add her onto the OR schedule for Monday, 08/04/2018 PM.  - Once discharged, return to [place] for likely hyperbarics and wound care.      Edrick Kins, DPM [facility]  Dr. Edrick Kins, DPM    [address], [place] [address]  Office [phone]  Fax [phone]

## 2018-08-02 NOTE — Progress Notes (Signed)
Currently out of the room. She has MRSA bacteremia.   I agree that she needs a line holiday. I will stop meropenem.  Will continue to follow. Thayer Headings, MD

## 2018-08-02 NOTE — Plan of Care (Signed)
  Problem: Health Behavior/Discharge Planning: Goal: Ability to manage health-related needs will improve Outcome: Progressing   Problem: Clinical Measurements: Goal: Ability to maintain clinical measurements within normal limits will improve Outcome: Progressing   

## 2018-08-02 NOTE — Progress Notes (Signed)
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 Temp HD cath yesterday with IR . HD#1 yesterday 2.5H, 0.2L UF . RCID following, will need line free holiday given MRSA bacteremia . Pt w/o complaint; sleepy  05/01 0701 - 05/02 0700 In: 1674.9 [I.V.:1377.7; IV Piggyback:297.3] Out: 200   Filed Weights   08/01/18 1934 08/01/18 2214 08/02/18 0558  Weight: 62.2 kg 62.1 kg 60.7 kg    Scheduled Meds: . amLODipine  5 mg Oral QHS  . calcitRIOL  0.5 mcg Oral Daily  . calcium acetate  1,334 mg Oral TID WC  . Chlorhexidine Gluconate Cloth  6 each Topical Q0600  . darbepoetin (ARANESP) injection - NON-DIALYSIS  200 mcg Subcutaneous Q Thu-1800  . docusate sodium  100 mg Oral BID  . heparin  5,000 Units Subcutaneous Q8H  . insulin aspart  0-5 Units Subcutaneous QHS  . insulin aspart  0-9 Units Subcutaneous TID WC  . insulin aspart  3 Units Subcutaneous TID WC  . insulin glargine  12 Units Subcutaneous QHS  . loratadine  10 mg Oral QHS  . mupirocin ointment  1 application Nasal BID  . oxyCODONE  10 mg Oral Once  . pravastatin  40 mg Oral Daily  . senna  1 tablet Oral BID  . sodium chloride flush  10-40 mL Intracatheter Q12H  . sodium chloride flush  3 mL Intravenous Q12H  . sodium chloride flush  3 mL Intravenous Q12H   Continuous Infusions: . sodium chloride    . meropenem (MERREM) IV 1 g (08/02/18 0830)  .  sodium bicarbonate  infusion 1000 mL 100 mL/hr at 08/02/18 0300   PRN Meds:.sodium chloride, acetaminophen **OR** acetaminophen, albuterol, benzonatate, calcium carbonate, diphenhydrAMINE, fentaNYL, guaiFENesin-dextromethorphan, lidocaine, ondansetron (ZOFRAN) IV, oxyCODONE, sodium chloride flush  Current Labs: reviewed  Results for DELSY, ETZKORN" (MRN [id number]) as of 08/01/2018 12:14  Ref. Range 07/31/2018 16:41  Saturation Ratios Latest Ref Range: 10.4 - 31.8 % 16  Results for ASHWIKA, FREELS" (MRN [id number]) as of 08/01/2018 12:14  Ref. Range 07/31/2018 16:41  PTH, Intact Latest Ref Range: 15 - 65 pg/mL 399 (H)    Physical Exam:  Blood pressure (!) 173/90, pulse (!) 104, temperature 98.8 F (37.1 C), temperature source Oral, resp. rate 14, height 5\' 8"  (1.727 m), weight 60.7 kg, last menstrual period 06/28/2018, SpO2 94 %. NAD RRR no mgr CTAB R Foot bandaged No rashes/lesions elsewhwere nonfocal S/nt/nd  A 1. New ESRD, Diabetic Nephropathy; s/p Temp IJ HD cath 5/1 with IR 2. DM1 3. R Foot abscess s/p I&D 07/31/18; vanc and meropenem; RCID following 4. MRSA bacteremia; RCID following; Vanc 5. 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 Anemia, normocytic 8. HTN on amlodipine  P . HD#2 today: 4K; 3.5Ca 3h Qb 300, 1L UF, Tight heparin . CLIP in process . No role for Fe with active infection . Cont ESA, started 4/30 . VVS following for [facility] and AV Access pendng improvement in infectious issues . Will plan for removing Temp HD cath and having line free period prior to placing [facility] . Medication Issues; o Preferred narcotic agents for pain control are hydromorphone, fentanyl, and methadone. Morphine should not be used.  o Baclofen should be avoided o Avoid oral sodium phosphate and magnesium citrate based laxatives / bowel preps    Pearson Grippe MD 08/02/2018, 10:12 AM  Recent Labs  Lab 07/30/18 0104  07/31/18 0651 07/31/18 1629 08/01/18 0249 08/02/18 0400  NA 135   < > 132* 133* 135 138  K 4.5   < > 4.4 4.2 4.2 3.1*  CL 109   < > 103  --  103 100  CO2 7*   < > 12*  --  12* 26  GLUCOSE 147*   < > 137* 136* 156* 171*  BUN 102*   < > 109*  --  108* 44*  CREATININE 7.23*   < > 7.84*  --  7.84* 4.31*  CALCIUM 6.8*   < > 5.6*  --  5.5* 7.0*  PHOS 5.9*  --   --   --  7.1* 4.1   < > = values in this interval not displayed.   Recent Labs  Lab 07/29/18 1557 07/30/18 0104   07/31/18 0300 07/31/18 1629 08/01/18 0249  WBC 24.7* 27.0*  --  21.5*  --  20.3*  NEUTROABS 22.8*  --   --   --   --   --   HGB 8.5* 8.2*  --  6.8* 8.2* 8.6*  HCT 26.8* 25.3*   < > 20.3* 24.0* 25.2*  MCV 88.2 86.3  --  83.2  --  83.2  PLT 528* 597*  --  576*  --  516*   < > = values in this interval not displayed.

## 2018-08-02 NOTE — Progress Notes (Signed)
PROGRESS NOTE  [name] BZJ:[id number] DOB: 14-Apr-1970 DOA: 07/29/2018 PCP: Loyola Mast, PA-C   LOS: 4 days   Brief Narrative / Interim history: 48 year old female with history of poorly controlled diabetes, multiple diabetic foot wants requiring multiple amputations, hypertension, hyperlipidemia who came into wound care on 4/28 with worsening right foot pain as well as fever and chills.  She was suspected to have an infection about a week ago and was placed on outpatient doxycycline but despite that her right foot continued to get worse.  She underwent an MRI in the ED which showed tenosynovitis/osteomyelitis, orthopedic surgery was consulted and patient was admitted to the hospital. Patient was taken to the OR on 4/30.  Due to progressive renal failure nephrology was consulted and looks like she will require dialysis.  Vascular surgery also consulted.  Blood cultures grew MRSA and ID was consulted as well  Subjective: No significant complaints this morning, she is "sleepy".  She denies any shortness of breath, denies any chest pain, denies any abdominal pain, nausea or vomiting.  She is intermittently coughing.  Assessment & Plan: Principal Problem:   Osteomyelitis of right foot ([place]) Active Problems:   Type 1 diabetes mellitus with neurological manifestations, uncontrolled ([place])   DM type 1 causing renal disease ([place])  Principal Problem Sepsis due to tenosynovitis and osteomyelitis of the right lower foot as well as MRSA bacteremia -Podiatry, [name] consulted.  Patient was taken to the operating room on 4/30 for debridement, however given extensive infection it appears that she may benefit from below the knee amputation.  This is to be discussed by [name] directly with patient  -She was initially placed on vancomycin and meropenem based on prior culture data. Blood cultures on 08/01/2018 grew MRSA.  Infectious disease was consulted.  -Sepsis physiology resolved  Active  Problems Acute kidney injury on chronic kidney disease stage IV /metabolic acidosis -Baseline creatinine about 3.5, patient is seeing Dr. Justin Mend in office but has not seen him in a long time.  -Nephrology consulted due to worsening creatinine and is now plans to undergo dialysis.  She got temporary cath and underwent dialysis on 5/1 and repeat dialysis on 5/2.  Plans are in place for patient to potentially get dialysis again on 5/4 then the temporary dialysis line to be removed to allow a line holiday and surveillance cultures to be obtained.  We will coordinate with nephrology and ID at that time  Chronic cough -Antibiotics as above, she was checked for Covid which was negative -Symptomatic management  Poorly controlled diabetes -Patient states that she ran out of supplies and they were not shipped to her in time and that is why she is poorly controlled. -She also has poor vision and cannot manipulate her pump.  She was transitioned to Lantus and sliding scale, fasting was good was slightly hypoglycemic today prelunch and will decrease the scheduled insulin  Chronic anemia of renal disease -No signs of bleeding however her hemoglobin dipped down to 6.8 which is likely multifactorial also in the setting of sepsis along with her renal disease.  -She was transfused a unit of packed red blood cells on 4/30, hemoglobin has remained stable. -Repeat CBC tomorrow.  Hypertension -Continue home amlodipine, hold Lasix  Hyperlipidemia -Continue statin   Scheduled Meds: . amLODipine  5 mg Oral QHS  . calcitRIOL  0.5 mcg Oral Daily  . calcium acetate  1,334 mg Oral TID WC  . Chlorhexidine Gluconate Cloth  6 each Topical Q0600  .  darbepoetin (ARANESP) injection - NON-DIALYSIS  200 mcg Subcutaneous Q Thu-1800  . docusate sodium  100 mg Oral BID  . heparin  5,000 Units Subcutaneous Q8H  . insulin aspart  0-5 Units Subcutaneous QHS  . insulin aspart  0-9 Units Subcutaneous TID WC  . insulin aspart  3  Units Subcutaneous TID WC  . insulin glargine  12 Units Subcutaneous QHS  . loratadine  10 mg Oral QHS  . mupirocin ointment  1 application Nasal BID  . oxyCODONE  10 mg Oral Once  . pravastatin  40 mg Oral Daily  . senna  1 tablet Oral BID  . sodium chloride flush  10-40 mL Intracatheter Q12H  . sodium chloride flush  3 mL Intravenous Q12H  . sodium chloride flush  3 mL Intravenous Q12H   Continuous Infusions: . sodium chloride    . meropenem (MERREM) IV 1 g (08/02/18 0830)  .  sodium bicarbonate  infusion 1000 mL 100 mL/hr at 08/02/18 0300   PRN Meds:.sodium chloride, acetaminophen **OR** acetaminophen, albuterol, benzonatate, calcium carbonate, diphenhydrAMINE, fentaNYL, guaiFENesin-dextromethorphan, lidocaine, ondansetron (ZOFRAN) IV, oxyCODONE, sodium chloride flush  DVT prophylaxis: heparin Code Status: Full code Family Communication: no family at bedside  Disposition Plan: home when ready   Consultants:   Podiatry   Procedures:   None   Antimicrobials:  Vancomycin 4.29>>  Meropenem 4/29 >>   Objective: Vitals:   08/01/18 2214 08/01/18 2329 08/02/18 0100 08/02/18 0558  BP: (!) 174/97 (!) 173/90    Pulse: (!) 109 (!) 111 (!) 104   Resp:  14 14   Temp: 99.6 F (37.6 C) 98.8 F (37.1 C)    TempSrc: Oral Oral    SpO2:  98% 94%   Weight: 62.1 kg   60.7 kg  Height:        Intake/Output Summary (Last 24 hours) at 08/02/2018 1215 Last data filed at 08/02/2018 0300 Gross per 24 hour  Intake 1674.91 ml  Output 200 ml  Net 1474.91 ml   Filed Weights   08/01/18 1934 08/01/18 2214 08/02/18 0558  Weight: 62.2 kg 62.1 kg 60.7 kg    Examination:  Constitutional: NAD Vitals:   08/01/18 2214 08/01/18 2329 08/02/18 0100 08/02/18 0558  BP: (!) 174/97 (!) 173/90    Pulse: (!) 109 (!) 111 (!) 104   Resp:  14 14   Temp: 99.6 F (37.6 C) 98.8 F (37.1 C)    TempSrc: Oral Oral    SpO2:  98% 94%   Weight: 62.1 kg   60.7 kg  Height:       Eyes: No icterus ENMT:  Moist mucous membranes Neck: normal, supple Respiratory: Clear to auscultation bilaterally without wheezing or crackles, normal respiratory effort Cardiovascular: R rate and rhythm, no murmurs Abdomen: Soft, NT, ND, BS positive Skin: No rashes seen Neurologic: Nonfocal, equal strength Psychiatric: Normal judgment and insight. Alert and oriented x 3. Normal mood.   Data Reviewed: I have independently reviewed following labs and imaging studies   CBC: Recent Labs  Lab 07/29/18 1557 07/30/18 0104 07/30/18 0356 07/31/18 0300 07/31/18 1629 08/01/18 0249  WBC 24.7* 27.0*  --  21.5*  --  20.3*  NEUTROABS 22.8*  --   --   --   --   --   HGB 8.5* 8.2*  --  6.8* 8.2* 8.6*  HCT 26.8* 25.3* 23.2* 20.3* 24.0* 25.2*  MCV 88.2 86.3  --  83.2  --  83.2  PLT 528* 597*  --  576*  --  025*   Basic Metabolic Panel: Recent Labs  Lab 07/30/18 0104 07/31/18 0300 07/31/18 0651 07/31/18 1629 08/01/18 0249 08/02/18 0400  NA 135 129* 132* 133* 135 138  K 4.5 6.1* 4.4 4.2 4.2 3.1*  CL 109 103 103  --  103 100  CO2 7* 10* 12*  --  12* 26  GLUCOSE 147* 85 137* 136* 156* 171*  BUN 102* 111* 109*  --  108* 44*  CREATININE 7.23* 7.74* 7.84*  --  7.84* 4.31*  CALCIUM 6.8* 5.4* 5.6*  --  5.5* 7.0*  MG 1.3*  --   --   --   --   --   PHOS 5.9*  --   --   --  7.1* 4.1   GFR: Estimated Creatinine Clearance: 15.6 mL/min (A) (by C-G formula based on SCr of 4.31 mg/dL (H)). Liver Function Tests: Recent Labs  Lab 07/29/18 1802 07/31/18 0300 08/01/18 0249 08/02/18 0400  AST 16 27  --   --   ALT 18 19  --   --   ALKPHOS 249* 198*  --   --   BILITOT 0.4 1.8*  --   --   PROT 6.3* 5.4*  --   --   ALBUMIN 2.1* 1.8* 1.8* 1.5*   No results for input(s): LIPASE, AMYLASE in the last 168 hours. No results for input(s): AMMONIA in the last 168 hours. Coagulation Profile: Recent Labs  Lab 07/30/18 0104  INR 1.4*   Cardiac Enzymes: No results for input(s): CKTOTAL, CKMB, CKMBINDEX, TROPONINI in the  last 168 hours. BNP (last 3 results) No results for input(s): PROBNP in the last 8760 hours. HbA1C: No results for input(s): HGBA1C in the last 72 hours. CBG: Recent Labs  Lab 08/01/18 1138 08/01/18 1727 08/01/18 2306 08/02/18 0801 08/02/18 1213  GLUCAP 204* 116* 170* 103* 69*   Lipid Profile: No results for input(s): CHOL, HDL, LDLCALC, TRIG, CHOLHDL, LDLDIRECT in the last 72 hours. Thyroid Function Tests: No results for input(s): TSH, T4TOTAL, FREET4, T3FREE, THYROIDAB in the last 72 hours. Anemia Panel: Recent Labs    07/31/18 1641  TIBC 171*  IRON 27*   Urine analysis:    Component Value Date/Time   COLORURINE YELLOW 07/29/2018 1524   APPEARANCEUR HAZY (A) 07/29/2018 1524   LABSPEC 1.010 07/29/2018 1524   PHURINE 5.0 07/29/2018 1524   GLUCOSEU >=500 (A) 07/29/2018 1524   GLUCOSEU 500 (A) 01/26/2015 1200   HGBUR SMALL (A) 07/29/2018 1524   BILIRUBINUR NEGATIVE 07/29/2018 1524   KETONESUR NEGATIVE 07/29/2018 1524   PROTEINUR >=300 (A) 07/29/2018 1524   UROBILINOGEN 0.2 01/26/2015 1200   NITRITE POSITIVE (A) 07/29/2018 1524   LEUKOCYTESUR SMALL (A) 07/29/2018 1524   Sepsis Labs: Invalid input(s): PROCALCITONIN, LACTICIDVEN  Recent Results (from the past 240 hour(s))  Blood culture (routine x 2)     Status: Abnormal (Preliminary result)   Collection Time: 07/29/18  3:24 PM  Result Value Ref Range Status   Specimen Description   Final    BLOOD BLOOD RIGHT FOREARM Performed at [facility] [address] [place], [place] [address]    Special Requests   Final    BOTTLES DRAWN AEROBIC AND ANAEROBIC Blood Culture adequate volume Performed at [facility] [address] [place], [place] [address]    Culture  Setup Time   Final    GRAM POSITIVE COCCI IN BOTH AEROBIC AND ANAEROBIC BOTTLES CRITICAL RESULT CALLED TO, READ BACK BY AND VERIFIED WITH: J.  Collene Mares, AT [phone] 08/01/18 BY Rush Landmark Performed at [facility], [place] [address], [place], [place] [address]    Culture STAPHYLOCOCCUS AUREUS (A)  Final   Report Status PENDING  Incomplete  Blood Culture ID Panel (Reflexed)     Status: Abnormal   Collection Time: 07/29/18  3:24 PM  Result Value Ref Range Status   Enterococcus species NOT DETECTED NOT DETECTED Final   Listeria monocytogenes NOT DETECTED NOT DETECTED Final   Staphylococcus species DETECTED (A) NOT DETECTED Final    Comment: CRITICAL RESULT CALLED TO, READ BACK BY AND VERIFIED WITH: Derry Lory PHARMD, AT [phone] 08/01/18 BY D. VANHOOK    Staphylococcus aureus (BCID) DETECTED (A) NOT DETECTED Final    Comment: Methicillin (oxacillin)-resistant Staphylococcus aureus (MRSA). MRSA is predictably resistant to beta-lactam antibiotics (except ceftaroline). Preferred therapy is vancomycin unless clinically contraindicated. Patient requires contact precautions if  hospitalized. CRITICAL RESULT CALLED TO, READ BACK BY AND VERIFIED WITH: Derry Lory PHARMD, AT [phone] 08/01/18 BY D. VANHOOK    Methicillin resistance DETECTED (A) NOT DETECTED Final    Comment: CRITICAL RESULT CALLED TO, READ BACK BY AND VERIFIED WITH: Derry Lory PHARMD, AT [phone] 08/01/18 BY D. VANHOOK    Streptococcus species NOT DETECTED NOT DETECTED Final   Streptococcus agalactiae NOT DETECTED NOT DETECTED Final   Streptococcus pneumoniae NOT DETECTED NOT DETECTED Final   Streptococcus pyogenes NOT DETECTED NOT DETECTED Final   Acinetobacter baumannii NOT DETECTED NOT DETECTED Final   Enterobacteriaceae species NOT DETECTED NOT DETECTED Final   Enterobacter cloacae complex NOT DETECTED NOT DETECTED Final   Escherichia coli NOT DETECTED NOT DETECTED Final   Klebsiella oxytoca NOT DETECTED NOT DETECTED Final   Klebsiella pneumoniae NOT DETECTED NOT DETECTED Final   Proteus species NOT DETECTED NOT DETECTED Final   Serratia marcescens NOT DETECTED NOT DETECTED Final   Haemophilus influenzae NOT DETECTED NOT DETECTED Final   Neisseria  meningitidis NOT DETECTED NOT DETECTED Final   Pseudomonas aeruginosa NOT DETECTED NOT DETECTED Final   Candida albicans NOT DETECTED NOT DETECTED Final   Candida glabrata NOT DETECTED NOT DETECTED Final   Candida krusei NOT DETECTED NOT DETECTED Final   Candida parapsilosis NOT DETECTED NOT DETECTED Final   Candida tropicalis NOT DETECTED NOT DETECTED Final    Comment: Performed at [facility], [place], [place], [place] [address]  SARS Coronavirus 2 [facility] order, Performed in [facility] hospital lab)     Status: None   Collection Time: 07/29/18  3:57 PM  Result Value Ref Range Status   SARS Coronavirus 2 NEGATIVE NEGATIVE Final    Comment: (NOTE) If result is NEGATIVE SARS-CoV-2 target nucleic acids are NOT DETECTED. The SARS-CoV-2 RNA is generally detectable in upper and lower  respiratory specimens during the acute phase of infection. The lowest  concentration of SARS-CoV-2 viral copies this assay can detect is 250  copies / mL. A negative result does not preclude SARS-CoV-2 infection  and should not be used as the sole basis for treatment or other  patient management decisions.  A negative result may occur with  improper specimen collection / handling, submission of specimen other  than nasopharyngeal swab, presence of viral mutation(s) within the  areas targeted by this assay, and inadequate number of viral copies  (<250 copies / mL). A negative result must be combined with clinical  observations, patient history, and epidemiological information. If result is POSITIVE SARS-CoV-2 target nucleic acids are DETECTED. The SARS-CoV-2 RNA  is generally detectable in upper and lower  respiratory specimens dur ing the acute phase of infection.  Positive  results are indicative of active infection with SARS-CoV-2.  Clinical  correlation with patient history and other diagnostic information is  necessary to determine patient infection status.  Positive results do  not  rule out bacterial infection or co-infection with other viruses. If result is PRESUMPTIVE POSTIVE SARS-CoV-2 nucleic acids MAY BE PRESENT.   A presumptive positive result was obtained on the submitted specimen  and confirmed on repeat testing.  While 2019 novel coronavirus  (SARS-CoV-2) nucleic acids may be present in the submitted sample  additional confirmatory testing may be necessary for epidemiological  and / or clinical management purposes  to differentiate between  SARS-CoV-2 and other Sarbecovirus currently known to infect humans.  If clinically indicated additional testing with an alternate test  methodology [phone]) is advised. The SARS-CoV-2 RNA is generally  detectable in upper and lower respiratory sp ecimens during the acute  phase of infection. The expected result is Negative. Fact Sheet for Patients:  StrictlyIdeas.no Fact Sheet for Healthcare Providers: BankingDealers.co.za This test is not yet approved or cleared by the [place] FDA and has been authorized for detection and/or diagnosis of SARS-CoV-2 by FDA under an Emergency Use Authorization (EUA).  This EUA will remain in effect (meaning this test can be used) for the duration of the COVID-19 declaration under Section 564(b)(1) of the Act, 21 U.S.C. section 360bbb-3(b)(1), unless the authorization is terminated or revoked sooner. Performed at [facility], [place] [address], [place], [place] [address]   MRSA PCR Screening     Status: Abnormal   Collection Time: 07/30/18  8:28 AM  Result Value Ref Range Status   MRSA by PCR POSITIVE (A) NEGATIVE Final    Comment:        The GeneXpert MRSA Assay (FDA approved for NASAL specimens only), is one component of a comprehensive MRSA colonization surveillance program. It is not intended to diagnose MRSA infection nor to guide or monitor treatment for MRSA infections. RESULT CALLED TO, READ BACK BY  AND VERIFIED WITH: A. Lovena Le RN 11:15 07/30/18 ([email or web address]) Performed at [facility], [place] [address], [place],  [address]   Respiratory Panel by PCR     Status: None   Collection Time: 07/30/18  8:29 AM  Result Value Ref Range Status   Adenovirus NOT DETECTED NOT DETECTED Final   Coronavirus 229E NOT DETECTED NOT DETECTED Final    Comment: (NOTE) The Coronavirus on the Respiratory Panel, DOES NOT test for the novel  Coronavirus (2019 nCoV)    Coronavirus HKU1 NOT DETECTED NOT DETECTED Final   Coronavirus NL63 NOT DETECTED NOT DETECTED Final   Coronavirus OC43 NOT DETECTED NOT DETECTED Final   Metapneumovirus NOT DETECTED NOT DETECTED Final   Rhinovirus / Enterovirus NOT DETECTED NOT DETECTED Final   Influenza A NOT DETECTED NOT DETECTED Final   Influenza B NOT DETECTED NOT DETECTED Final   Parainfluenza Virus 1 NOT DETECTED NOT DETECTED Final   Parainfluenza Virus 2 NOT DETECTED NOT DETECTED Final   Parainfluenza Virus 3 NOT DETECTED NOT DETECTED Final   Parainfluenza Virus 4 NOT DETECTED NOT DETECTED Final   Respiratory Syncytial Virus NOT DETECTED NOT DETECTED Final   Bordetella pertussis NOT DETECTED NOT DETECTED Final   Chlamydophila pneumoniae NOT DETECTED NOT DETECTED Final   Mycoplasma pneumoniae NOT DETECTED NOT DETECTED Final    Comment: Performed at [facility], [address] [place], [place] [address]  Aerobic/Anaerobic Culture (surgical/deep wound)     Status: None (Preliminary result)   Collection Time: 07/31/18  6:52 PM  Result Value Ref Range Status   Specimen Description ABSCESS RIGHT FOOT  Final   Special Requests NONE  Final   Gram Stain   Final    ABUNDANT WBC PRESENT,BOTH PMN AND MONONUCLEAR ABUNDANT GRAM POSITIVE COCCI Performed at [facility], [address], [place], [place] [address]    Culture   Final    MODERATE METHICILLIN RESISTANT STAPHYLOCOCCUS AUREUS NO ANAEROBES ISOLATED; CULTURE IN PROGRESS FOR 5 DAYS    Report  Status PENDING  Incomplete   Organism ID, Bacteria METHICILLIN RESISTANT STAPHYLOCOCCUS AUREUS  Final      Susceptibility   Methicillin resistant staphylococcus aureus - MIC*    CIPROFLOXACIN >=8 RESISTANT Resistant     ERYTHROMYCIN >=8 RESISTANT Resistant     GENTAMICIN <=0.5 SENSITIVE Sensitive     OXACILLIN >=4 RESISTANT Resistant     TETRACYCLINE >=16 RESISTANT Resistant     VANCOMYCIN 1 SENSITIVE Sensitive     TRIMETH/SULFA <=10 SENSITIVE Sensitive     CLINDAMYCIN RESISTANT Resistant     RIFAMPIN <=0.5 SENSITIVE Sensitive     Inducible Clindamycin POSITIVE Resistant     * MODERATE METHICILLIN RESISTANT STAPHYLOCOCCUS AUREUS  Culture, blood (routine x 2)     Status: None (Preliminary result)   Collection Time: 08/01/18 10:50 AM  Result Value Ref Range Status   Specimen Description BLOOD LEFT HAND  Final   Special Requests   Final    BOTTLES DRAWN AEROBIC AND ANAEROBIC Blood Culture adequate volume   Culture   Final    NO GROWTH < 24 HOURS Performed at [facility], [address] [place], [place] [address]    Report Status PENDING  Incomplete  Culture, blood (routine x 2)     Status: None (Preliminary result)   Collection Time: 08/01/18 11:00 AM  Result Value Ref Range Status   Specimen Description BLOOD RIGHT HAND  Final   Special Requests   Final    AEROBIC BOTTLE ONLY Blood Culture results may not be optimal due to an inadequate volume of blood received in culture bottles   Culture   Final    NO GROWTH < 24 HOURS Performed at [facility], [place] [address], [place], [place] [address]    Report Status PENDING  Incomplete      Radiology Studies: Ir Fluoro Guide Cv Line Right  Result Date: 08/01/2018 INDICATION: Chronic kidney disease and needing dialysis. EXAM: FLUOROSCOPIC AND ULTRASOUND GUIDED PLACEMENT OF A NON-TUNNELED DIALYSIS CATHETER Physician: Stephan Minister. Henn, MD MEDICATIONS: None ANESTHESIA/SEDATION: None FLUOROSCOPY TIME:  Fluoroscopy Time: 6 seconds,  0.3 mGy COMPLICATIONS: None immediate. PROCEDURE: The procedure was explained to the patient. The risks and benefits of the procedure were discussed and the patient's questions were addressed. Informed consent was obtained from the patient. The patient was placed supine on the interventional table. Ultrasound confirmed a patent right internal jugular vein. Ultrasound images were obtained for documentation. The right side of the neck was prepped and draped in a sterile fashion. The right neck was anesthetized with 1% lidocaine. Maximal barrier sterile technique was utilized including caps, mask, sterile gowns, sterile gloves, sterile drape, hand hygiene and skin antiseptic. A small incision was made with #11 blade scalpel. A 21 gauge needle directed into the right internal jugular vein with ultrasound guidance. A micropuncture dilator set was  placed. A 16 cm Mahurkar catheter was selected. The catheter was advanced over a wire and positioned at the superior cavoatrial junction. Fluoroscopic images were obtained for documentation. Both dialysis lumens were found to aspirate and flush well. The proper amount of heparin was flushed in both lumens. The central venous lumen was flushed with normal saline. Catheter was sutured to skin. FINDINGS: Catheter tip at the superior cavoatrial junction. IMPRESSION: Successful placement of a right jugular non-tunneled dialysis catheter using ultrasound and fluoroscopic guidance. Electronically Signed   By: Markus Daft M.D.   On: 08/01/2018 17:22   Ir US Guide Vasc Access Right  Result Date: 08/01/2018 INDICATION: Chronic kidney disease and needing dialysis. EXAM: FLUOROSCOPIC AND ULTRASOUND GUIDED PLACEMENT OF A NON-TUNNELED DIALYSIS CATHETER Physician: Stephan Minister. Henn, MD MEDICATIONS: None ANESTHESIA/SEDATION: None FLUOROSCOPY TIME:  Fluoroscopy Time: 6 seconds, 0.3 mGy COMPLICATIONS: None immediate. PROCEDURE: The procedure was explained to the patient. The risks and benefits of  the procedure were discussed and the patient's questions were addressed. Informed consent was obtained from the patient. The patient was placed supine on the interventional table. Ultrasound confirmed a patent right internal jugular vein. Ultrasound images were obtained for documentation. The right side of the neck was prepped and draped in a sterile fashion. The right neck was anesthetized with 1% lidocaine. Maximal barrier sterile technique was utilized including caps, mask, sterile gowns, sterile gloves, sterile drape, hand hygiene and skin antiseptic. A small incision was made with #11 blade scalpel. A 21 gauge needle directed into the right internal jugular vein with ultrasound guidance. A micropuncture dilator set was placed. A 16 cm Mahurkar catheter was selected. The catheter was advanced over a wire and positioned at the superior cavoatrial junction. Fluoroscopic images were obtained for documentation. Both dialysis lumens were found to aspirate and flush well. The proper amount of heparin was flushed in both lumens. The central venous lumen was flushed with normal saline. Catheter was sutured to skin. FINDINGS: Catheter tip at the superior cavoatrial junction. IMPRESSION: Successful placement of a right jugular non-tunneled dialysis catheter using ultrasound and fluoroscopic guidance. Electronically Signed   By: Markus Daft M.D.   On: 08/01/2018 17:22   Vas [place] Upper Ext Vein Mapping (pre-op Avf)  Result Date: 08/01/2018 UPPER EXTREMITY VEIN MAPPING  Indications: Pre-access. Performing Technologist: Toma Copier RVS  Examination Guidelines: A complete evaluation includes B-mode imaging, spectral Doppler, color Doppler, and power Doppler as needed of all accessible portions of each vessel. Bilateral testing is considered an integral part of a complete examination. Limited examinations for reoccurring indications may be performed as noted.  +-----------------+-------------+----------+------------------------------+ Right Cephalic   Diameter (cm)Depth (cm)           Findings            +-----------------+-------------+----------+------------------------------+ Shoulder             0.19        0.46                                  +-----------------+-------------+----------+------------------------------+ Prox upper arm       0.21        0.54                                  +-----------------+-------------+----------+------------------------------+ Mid upper arm        0.26  0.58                                  +-----------------+-------------+----------+------------------------------+ Dist upper arm       0.17        0.35                                  +-----------------+-------------+----------+------------------------------+ Antecubital fossa    0.17        0.32                                  +-----------------+-------------+----------+------------------------------+ Prox forearm         0.25        0.32                                  +-----------------+-------------+----------+------------------------------+ Mid forearm                             branching and Chronic thrombus +-----------------+-------------+----------+------------------------------+ Dist forearm                                    not visualized         +-----------------+-------------+----------+------------------------------+ Wrist                                           not visualized         +-----------------+-------------+----------+------------------------------+ +-----------------+-------------+----------+---------+ Right Basilic    Diameter (cm)Depth (cm)Findings  +-----------------+-------------+----------+---------+ Mid upper arm        0.54        0.73             +-----------------+-------------+----------+---------+ Dist upper arm       0.26        0.53              +-----------------+-------------+----------+---------+ Antecubital fossa    0.15        1.05             +-----------------+-------------+----------+---------+ Prox forearm         0.11        0.63   branching +-----------------+-------------+----------+---------+ +-----------------+-------------+----------+--------------+ Left Cephalic    Diameter (cm)Depth (cm)   Findings    +-----------------+-------------+----------+--------------+ Shoulder             0.23        0.58                  +-----------------+-------------+----------+--------------+ Prox upper arm       0.17        0.52                  +-----------------+-------------+----------+--------------+ Mid upper arm        0.21        0.65                  +-----------------+-------------+----------+--------------+ Dist upper arm       0.21        0.44                  +-----------------+-------------+----------+--------------+  Antecubital fossa                       not visualized +-----------------+-------------+----------+--------------+ Prox forearm                            not visualized +-----------------+-------------+----------+--------------+ Mid forearm                             not visualized +-----------------+-------------+----------+--------------+ Dist forearm                            not visualized +-----------------+-------------+----------+--------------+ Wrist                                   not visualized +-----------------+-------------+----------+--------------+ +-----------------+------------+---------+------------------------------------+ Left Basilic       Diameter    Depth                Findings                                    (cm)      (cm)                                        +-----------------+------------+---------+------------------------------------+ Mid upper arm        0.21      0.53                                         +-----------------+------------+---------+------------------------------------+ Dist upper arm       0.15      0.49                                        +-----------------+------------+---------+------------------------------------+ Antecubital fossa                                not visualized            +-----------------+------------+---------+------------------------------------+ Prox forearm                               not visualized and IV site                                                        dressinging              +-----------------+------------+---------+------------------------------------+ Mid forearm                           not visualized and IV site dressing  +-----------------+------------+---------+------------------------------------+ Distal forearm  not visualized            +-----------------+------------+---------+------------------------------------+ Elbow                                            not visualized            +-----------------+------------+---------+------------------------------------+ Wrist                                            not visualized            +-----------------+------------+---------+------------------------------------+ *See table(s) above for measurements and observations.  Diagnosing physician:    Preliminary     Marzetta Board, MD, PhD [facility]  Contact via  [email or web address]  [place] P: [phone]  F: [phone]

## 2018-08-02 NOTE — Progress Notes (Signed)
Pharmacy Antibiotic Note  [name] is a 48 y.o. female admitted on 07/29/2018 with osteomyeltis and MRSA bacteremia Transitioning from Vancomycin (last dose 430) to Daptomycin with worsening renal function and progression to possible hemodialysis - now back to vancomycin with HD. Pt received 750mg  Vanc after HD yesterday. Pt to return to HD today. Pre-HD level = 32   Plan: Change Meropenem 500mg  IV Q 24 hours - HD dosing Follow HD schedule and how pt tolerates HD today If tolerates full HD session today would expect Vanc post-HD level to be 18 Re-check vanc level prior to next dose  Height: 5\' 8"  (172.7 cm) Weight: 133 lb 13.1 oz (60.7 kg) IBW/kg (Calculated) : 63.9  Temp (24hrs), Avg:99 F (37.2 C), Min:98.6 F (37 C), Max:99.6 F (37.6 C)  Recent Labs  Lab 07/29/18 1557  07/30/18 0104 07/31/18 0300 07/31/18 0651 08/01/18 0249 08/02/18 0400  WBC 24.7*  --  27.0* 21.5*  --  20.3*  --   CREATININE  --    < > 7.23* 7.74* 7.84* 7.84* 4.31*  LATICACIDVEN 1.6  --   --   --   --   --   --   VANCORANDOM  --   --   --  16  --   --  32   < > = values in this interval not displayed.    Estimated Creatinine Clearance: 15.6 mL/min (A) (by C-G formula based on SCr of 4.31 mg/dL (H)).    Allergies  Allergen Reactions  . Cleocin [Clindamycin Hcl] Diarrhea  . Lisinopril Other (See Comments)    Elevated potassium per pt report  . Amoxicillin Diarrhea, Nausea Only and Other (See Comments)    Has patient had a PCN reaction causing immediate rash, facial/tongue/throat swelling, SOB or lightheadedness with hypotension: No Has patient had a PCN reaction causing severe rash involving mucus membranes or skin necrosis: No Has patient had a PCN reaction that required hospitalization: No Has patient had a PCN reaction occurring within the last 10 years: No If all of the above answers are "NO", then may proceed with Cephalosporin use.   . Bactrim [Sulfamethoxazole-Trimethoprim] Diarrhea  and Nausea Only      Thank you for allowing pharmacy to be a part of this patient's care. Juanell Fairly, PharmD PGY1 Pharmacy Resident 08/02/2018 12:25 PM

## 2018-08-03 ENCOUNTER — Inpatient Hospital Stay (HOSPITAL_COMMUNITY): Payer: Medicare Other

## 2018-08-03 DIAGNOSIS — R7881 Bacteremia: Secondary | ICD-10-CM

## 2018-08-03 DIAGNOSIS — E1021 Type 1 diabetes mellitus with diabetic nephropathy: Secondary | ICD-10-CM

## 2018-08-03 DIAGNOSIS — I10 Essential (primary) hypertension: Secondary | ICD-10-CM

## 2018-08-03 LAB — RENAL FUNCTION PANEL
Albumin: 1.4 g/dL — ABNORMAL LOW (ref 3.5–5.0)
Anion gap: 13 (ref 5–15)
BUN: 22 mg/dL — ABNORMAL HIGH (ref 6–20)
CO2: 25 mmol/L (ref 22–32)
Calcium: 6.5 mg/dL — ABNORMAL LOW (ref 8.9–10.3)
Chloride: 100 mmol/L (ref 98–111)
Creatinine, Ser: 3.22 mg/dL — ABNORMAL HIGH (ref 0.44–1.00)
GFR calc Af Amer: 19 mL/min — ABNORMAL LOW (ref >60)
GFR calc non Af Amer: 16 mL/min — ABNORMAL LOW (ref >60)
Glucose, Bld: 355 mg/dL — ABNORMAL HIGH (ref 70–99)
Phosphorus: 4.4 mg/dL (ref 2.5–4.6)
Potassium: 3.9 mmol/L (ref 3.5–5.1)
Sodium: 138 mmol/L (ref 135–145)

## 2018-08-03 LAB — CBC
HCT: 26.5 % — ABNORMAL LOW (ref 36.0–46.0)
Hemoglobin: 8.6 g/dL — ABNORMAL LOW (ref 12.0–15.0)
MCH: 27.8 pg (ref 26.0–34.0)
MCHC: 32.5 g/dL (ref 30.0–36.0)
MCV: 85.8 fL (ref 80.0–100.0)
Platelets: 384 10*3/uL (ref 150–400)
RBC: 3.09 MIL/uL — ABNORMAL LOW (ref 3.87–5.11)
RDW: 14.2 % (ref 11.5–15.5)
WBC: 11.5 10*3/uL — ABNORMAL HIGH (ref 4.0–10.5)
nRBC: 0 % (ref 0.0–0.2)

## 2018-08-03 LAB — GLUCOSE, CAPILLARY
Glucose-Capillary: 441 mg/dL — ABNORMAL HIGH (ref 70–99)
Glucose-Capillary: 425 mg/dL — ABNORMAL HIGH (ref 70–99)
Glucose-Capillary: 350 mg/dL — ABNORMAL HIGH (ref 70–99)
Glucose-Capillary: 373 mg/dL — ABNORMAL HIGH (ref 70–99)
Glucose-Capillary: 307 mg/dL — ABNORMAL HIGH (ref 70–99)

## 2018-08-03 MED ORDER — INSULIN ASPART 100 UNIT/ML ~~LOC~~ SOLN
3.0000 [IU] | Freq: Once | SUBCUTANEOUS | Status: AC
  Administered 2018-08-03: 3 [IU] via SUBCUTANEOUS

## 2018-08-03 MED ORDER — INSULIN ASPART 100 UNIT/ML ~~LOC~~ SOLN
4.0000 [IU] | Freq: Once | SUBCUTANEOUS | Status: AC
  Administered 2018-08-03: 4 [IU] via SUBCUTANEOUS

## 2018-08-03 MED ORDER — INSULIN ASPART 100 UNIT/ML ~~LOC~~ SOLN: 12.0000 [IU] | Freq: Once | SUBCUTANEOUS | Status: DC

## 2018-08-03 NOTE — Plan of Care (Signed)
  Problem: Clinical Measurements: Goal: Ability to maintain clinical measurements within normal limits will improve Outcome: Not Progressing Note:  Pt. noncompliant and refusing treatments and meds.

## 2018-08-03 NOTE — Progress Notes (Signed)
Pt BG is 350. Pt refused the ordered dose of 7 units on sliding scale. Arrien, MD notified and diabetes coordinator consult order placed.

## 2018-08-03 NOTE — Progress Notes (Signed)
*  PRELIMINARY RESULTS* Echocardiogram 2D Echocardiogram LIMITED has been performed.  Leavy Cella 08/03/2018, 4:51 PM

## 2018-08-03 NOTE — Progress Notes (Signed)
Pt. refusing meds; stating that the staff and doctors do not know what they are doing; hard for staff to do scheduled duties for pt. due to her being on the phone constantly; and wants to know why she isn't on an antibiotic. RN explained to pt. that she needs to discuss this with her Dr. in the morning during rounds; did explain to pt. that she was on Meropenem, but not now, and Vancomycin IV is ordered depending on renal function per Rx. Charge nurse Ronalee Belts talked with pt.

## 2018-08-03 NOTE — Progress Notes (Signed)
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 HD#2 yesterday: 1L UF  05/02 0701 - 05/03 0700 In: 1338.7 [P.O.:480; I.V.:858.7] Out: 1404 [Urine:400]  Filed Weights   08/01/18 2214 08/02/18 0558 08/02/18 1542  Weight: 62.1 kg 60.7 kg 59.7 kg    Scheduled Meds: . amLODipine  5 mg Oral QHS  . calcitRIOL  0.5 mcg Oral Daily  . calcium acetate  1,334 mg Oral TID WC  . Chlorhexidine Gluconate Cloth  6 each Topical Q0600  . darbepoetin (ARANESP) injection - NON-DIALYSIS  200 mcg Subcutaneous Q Thu-1800  . docusate sodium  100 mg Oral BID  . heparin  5,000 Units Subcutaneous Q8H  . insulin aspart  0-5 Units Subcutaneous QHS  . insulin aspart  0-9 Units Subcutaneous TID WC  . insulin aspart  1 Units Subcutaneous TID WC  . insulin glargine  12 Units Subcutaneous QHS  . loratadine  10 mg Oral QHS  . mupirocin ointment  1 application Nasal BID  . oxyCODONE  10 mg Oral Once  . pravastatin  40 mg Oral Daily  . senna  1 tablet Oral BID  . sodium chloride flush  10-40 mL Intracatheter Q12H  . sodium chloride flush  3 mL Intravenous Q12H  . sodium chloride flush  3 mL Intravenous Q12H  . vancomycin variable dose per unstable renal function (pharmacist dosing)   Does not apply See admin instructions   Continuous Infusions: . sodium chloride    .  sodium bicarbonate  infusion 1000 mL 100 mL/hr at 08/02/18 0300   PRN Meds:.sodium chloride, acetaminophen **OR** acetaminophen, albuterol, benzonatate, calcium carbonate, diphenhydrAMINE, fentaNYL, guaiFENesin-dextromethorphan, lidocaine, ondansetron (ZOFRAN) IV, oxyCODONE, sodium chloride flush  Current Labs: reviewed  Results for [name], [name]" (MRN [id number]) as of 08/01/2018 12:14  Ref. Range 07/31/2018 16:41  Saturation Ratios Latest Ref Range: 10.4 - 31.8 % 16  Results for [name], [name]" (MRN [id number]) as of 08/01/2018 12:14  Ref. Range  07/31/2018 16:41  PTH, Intact Latest Ref Range: 15 - 65 pg/mL 399 (H)    Physical Exam:  Blood pressure (!) 142/73, pulse 74, temperature 98.9 F (37.2 C), resp. rate (!) 27, height 5\' 8"  (1.727 m), weight 59.7 kg, last menstrual period 06/28/2018, SpO2 100 %. NAD RRR no mgr CTAB R Foot bandaged No rashes/lesions elsewhwere nonfocal S/nt/nd  A 1. New ESRD, Diabetic Nephropathy; s/p Temp IJ HD cath 5/1 with IR: s/p HD#1 and HD#2 2. DM1 3. R Foot abscess s/p I&D 07/31/18; vanc; [facility] following 4. MRSA bacteremia; [facility] following; Vanc 5. 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 Anemia, normocytic 8. HTN on amlodipine  P . HD#3 today: 3K; 2.5Ca 3.5h Qb 300, 1L UF, Tight heparin . Suggest removal of HD cath tomororw post HD, try to hold HD until 5/7 . CLIP in process . No role for Fe with active infection . Cont ESA, started 4/30 . [facility] following for [facility] and AV Access pendng improvement in infectious issues . Medication Issues; o Preferred narcotic agents for pain control are hydromorphone, fentanyl, and methadone. Morphine should not be used.  o Baclofen should be avoided o Avoid oral sodium phosphate and magnesium citrate based laxatives / bowel preps    Pearson Grippe MD 08/03/2018, 11:36 AM  Recent Labs  Lab 08/01/18 0249 08/02/18 0400 08/03/18 0440  NA 135 138 138  K 4.2  3.1* 3.9  CL 103 100 100  CO2 12* 26 25  GLUCOSE 156* 171* 355*  BUN 108* 44* 22*  CREATININE 7.84* 4.31* 3.22*  CALCIUM 5.5* 7.0* 6.5*  PHOS 7.1* 4.1 4.4   Recent Labs  Lab 07/29/18 1557  07/31/18 0300 07/31/18 1629 08/01/18 0249 08/03/18 0440  WBC 24.7*   < > 21.5*  --  20.3* 11.5*  NEUTROABS 22.8*  --   --   --   --   --   HGB 8.5*   < > 6.8* 8.2* 8.6* 8.6*  HCT 26.8*   < > 20.3* 24.0* 25.2* 26.5*  MCV 88.2   < > 83.2  --  83.2 85.8  PLT 528*   < > 576*  --  516* 384   < > = values in this interval not displayed.

## 2018-08-03 NOTE — Progress Notes (Signed)
Received verbal order from [place], MD to administer 4 units of insulin to correct pt's BG of 373.

## 2018-08-03 NOTE — Progress Notes (Signed)
Pt BG 425 this am and Arrien, MD ordered 12 units. Pt refused the 12 units and stated her pump calculated to give 4 units for BG of 425. Valine Drozdowski, RN entered the BG of 425 into pump and pump did notify to administer 4 units. Dr Cathlean Sauer ordered one time dose of 4 units to correct BG this am.

## 2018-08-03 NOTE — Progress Notes (Signed)
Page returned from [place], MD. Verbal order received to administer 3 units to cover BG of 350 per insulin pump calculation. Will continue to monitor and keep Arrien, MD updated with BG status after 3 units is administered. Education given to pt concerning high blood glucose levels and importance of the correct amount of insulin.

## 2018-08-03 NOTE — Progress Notes (Signed)
PROGRESS NOTE    [name]  KTG:[id number] DOB: [birth year] DOA: 07/29/2018 PCP: Annye English    Brief Narrative:  48 year old female who presents with right foot edema and pain.  She does have significant past medical history of poorly controlled type 1 diabetes mellitus, diabetic foot, status post multiple amputations, hypertension and dyslipidemia.  Presented with 1 week history of worsening right lower extremity pain, erythema and edema, refractive to oral biotic therapy with doxycycline.  Positive fevers and chills.  On her initial physical examination her temperature was 100.1, heart rate 123, blood pressure 148/76, respiratory rate 20, oxygen saturation 100.  She had moist mucous membranes, her lungs are clear to auscultation bilaterally, heart S1-S2 present tachycardic, the abdomen was soft nontender, her right lower extremity was edematous, positive erythema.  Sodium 133, potassium 4.5, chloride 113, bicarb less than 7, glucose 61, BUN 101, creatinine 7.0, anion gap 14, white count 24.7, hemoglobin 8.5, hematocrit 26.8, platelets 528. MRI of the right ankle showed tenosynovitis of the anterior tibialis tendon and extensor hallucis longus tendon plus extending into the first tarsal joint. Osteomyelitis involving the medial cuneiform and bases of the first and second metatarsals.    Patient was admitted to the hospital with working diagnosis of sepsis due to right foot tenosynovitis/osteomyelitis.  Complicated by acute kidney injury chronic kidney disease.  Patient underwent incision and drainage of right foot abscess on April 30.  Her blood cultures grew MRSA.  She had progressive renal failure, underwent hemodialysis catheter placement and proceeded with renal replacement therapy.  Assessment & Plan:   Principal Problem:   Osteomyelitis of right foot ([place]) Active Problems:   Type 1 diabetes mellitus with neurological manifestations, uncontrolled ([place])   DM type 1  causing renal disease ([place])  1. Sepsis due to right foot tenosynovitis and osteomyelitis. MRSA bacteremia. Follow up blood cultures from 05.01 with no growth. Patient has been afebrile and improved leukocytosis (down to 11,5 from 20,3). Will continue IV vancomycin per pharmacy protocol. Recommended BKA but patient declines procedure for now. Plan for OR in am per orthopedics. Plan for [facility] catheter removal on Monday, line holiday.   2. 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 Today with no signs of volume overload, no clinical uremia, no hyperkalemia or significant acidosis., Patient has a HD catheter in place, had HD on 05/1 and 05/2. Documented urine output over last 24 H is 400 ml. Follow with nephrology for further recommendations, patient will need outpatient HD unit. Continue Phoslo, calcitriol, and darbepoetin, Will discontinue sodium bicarb infusion, bicarb today is 25.   3. T1DM. Patient at home on insulin pump. Her glucose has been elevated but patient declining high dose of insulin. Will continue sliding scale for now along with basal insulin of 12 units and 3 units of pre-meal insulin. Patient is tolerating po well. Has ophthalmology appointment in am that needs to be rescheduled. Fasting glucose this am 355.  4. HTN. Continue blood pressure control with amlodipine.    DVT prophylaxis: heparin   Code Status: full Family Communication: no family at the bedside Disposition Plan/ discharge barriers: pending outpatient HD unit and further wound care.   Body mass index is 20.01 kg/m. Malnutrition Type:      Malnutrition Characteristics:      Nutrition Interventions:     RN Pressure Injury Documentation:     Consultants:     Procedures:     Antimicrobials:   IV vancomycin     Subjective: Patient  continue to have right foot pain, moderate in intensity, no nausea or vomiting, no chest pain or dyspnea. Has been declining high doses of insulin,.   Objective: Vitals:   08/02/18  1530 08/02/18 1542 08/02/18 2209 08/03/18 0209  BP: 133/67 (!) 131/59 139/82 (!) 148/79  Pulse: 90 92 72 74  Resp:  14 19 15   Temp:  99 F (37.2 C) 99 F (37.2 C) 98.9 F (37.2 C)  TempSrc:  Oral    SpO2:  95% 100% 98%  Weight:  59.7 kg    Height:        Intake/Output Summary (Last 24 hours) at 08/03/2018 9702 Last data filed at 08/03/2018 0400 Gross per 24 hour  Intake 1338.73 ml  Output 1404 ml  Net -65.27 ml   Filed Weights   08/01/18 2214 08/02/18 0558 08/02/18 1542  Weight: 62.1 kg 60.7 kg 59.7 kg    Examination:   General: Not in pain or dyspnea  Neurology: Awake and alert, non focal  E ENT: mild pallor, no icterus, oral mucosa moist Cardiovascular: No JVD. S1-S2 present, rhythmic, no gallops, rubs, or murmurs. No lower extremity edema. Pulmonary: positive breath sounds bilaterally, adequate air movement, no wheezing, rhonchi or rales. Gastrointestinal. Abdomen with no organomegaly, non tender, no rebound or guarding Skin.right foot wound with dressing in place.  Musculoskeletal: right foot with dressing in place.      Data Reviewed: I have personally reviewed following labs and imaging studies  CBC: Recent Labs  Lab 07/29/18 1557 07/30/18 0104 07/30/18 0356 07/31/18 0300 07/31/18 1629 08/01/18 0249 08/03/18 0440  WBC 24.7* 27.0*  --  21.5*  --  20.3* 11.5*  NEUTROABS 22.8*  --   --   --   --   --   --   HGB 8.5* 8.2*  --  6.8* 8.2* 8.6* 8.6*  HCT 26.8* 25.3* 23.2* 20.3* 24.0* 25.2* 26.5*  MCV 88.2 86.3  --  83.2  --  83.2 85.8  PLT 528* 597*  --  576*  --  516* 637   Basic Metabolic Panel: Recent Labs  Lab 07/30/18 0104 07/31/18 0300 07/31/18 0651 07/31/18 1629 08/01/18 0249 08/02/18 0400 08/03/18 0440  NA 135 129* 132* 133* 135 138 138  K 4.5 6.1* 4.4 4.2 4.2 3.1* 3.9  CL 109 103 103  --  103 100 100  CO2 7* 10* 12*  --  12* 26 25  GLUCOSE 147* 85 137* 136* 156* 171* 355*  BUN 102* 111* 109*  --  108* 44* 22*  CREATININE 7.23* 7.74* 7.84*   --  7.84* 4.31* 3.22*  CALCIUM 6.8* 5.4* 5.6*  --  5.5* 7.0* 6.5*  MG 1.3*  --   --   --   --   --   --   PHOS 5.9*  --   --   --  7.1* 4.1 4.4   GFR: Estimated Creatinine Clearance: 20.6 mL/min (A) (by C-G formula based on SCr of 3.22 mg/dL (H)). Liver Function Tests: Recent Labs  Lab 07/29/18 1802 07/31/18 0300 08/01/18 0249 08/02/18 0400 08/03/18 0440  AST 16 27  --   --   --   ALT 18 19  --   --   --   ALKPHOS 249* 198*  --   --   --   BILITOT 0.4 1.8*  --   --   --   PROT 6.3* 5.4*  --   --   --   ALBUMIN 2.1* 1.8* 1.8*  1.5* 1.4*   No results for input(s): LIPASE, AMYLASE in the last 168 hours. No results for input(s): AMMONIA in the last 168 hours. Coagulation Profile: Recent Labs  Lab 07/30/18 0104  INR 1.4*   Cardiac Enzymes: No results for input(s): CKTOTAL, CKMB, CKMBINDEX, TROPONINI in the last 168 hours. BNP (last 3 results) No results for input(s): PROBNP in the last 8760 hours. HbA1C: No results for input(s): HGBA1C in the last 72 hours. CBG: Recent Labs  Lab 08/02/18 2223 08/02/18 2233 08/02/18 2357 08/03/18 0808 08/03/18 0852  GLUCAP 104* 165* 171* 441* 425*   Lipid Profile: No results for input(s): CHOL, HDL, LDLCALC, TRIG, CHOLHDL, LDLDIRECT in the last 72 hours. Thyroid Function Tests: No results for input(s): TSH, T4TOTAL, FREET4, T3FREE, THYROIDAB in the last 72 hours. Anemia Panel: Recent Labs    07/31/18 1641  TIBC 171*  IRON 27*      Radiology Studies: I have reviewed all of the imaging during this hospital visit personally     Scheduled Meds: . amLODipine  5 mg Oral QHS  . calcitRIOL  0.5 mcg Oral Daily  . calcium acetate  1,334 mg Oral TID WC  . Chlorhexidine Gluconate Cloth  6 each Topical Q0600  . darbepoetin (ARANESP) injection - NON-DIALYSIS  200 mcg Subcutaneous Q Thu-1800  . docusate sodium  100 mg Oral BID  . heparin  5,000 Units Subcutaneous Q8H  . insulin aspart  0-5 Units Subcutaneous QHS  . insulin aspart   0-9 Units Subcutaneous TID WC  . insulin aspart  1 Units Subcutaneous TID WC  . insulin aspart  12 Units Subcutaneous Once  . insulin glargine  12 Units Subcutaneous QHS  . loratadine  10 mg Oral QHS  . mupirocin ointment  1 application Nasal BID  . oxyCODONE  10 mg Oral Once  . pravastatin  40 mg Oral Daily  . senna  1 tablet Oral BID  . sodium chloride flush  10-40 mL Intracatheter Q12H  . sodium chloride flush  3 mL Intravenous Q12H  . sodium chloride flush  3 mL Intravenous Q12H  . vancomycin variable dose per unstable renal function (pharmacist dosing)   Does not apply See admin instructions   Continuous Infusions: . sodium chloride    .  sodium bicarbonate  infusion 1000 mL 100 mL/hr at 08/02/18 0300     LOS: 5 days        Britton Bera Gerome Apley, MD

## 2018-08-03 NOTE — Progress Notes (Signed)
Pt BG 441. Re-checked BG 425. Arrien, MD notified.

## 2018-08-03 NOTE — Progress Notes (Signed)
Around 1050 Nurse Tech noted cbg - 61, pt minimally responsive and will not eat or drink. 1/2 amp D50 given with minimal improvement, 2nd half of D50 given. Blood sugars improved however pt refuses to eat, drink or take po medications. Page to Dr Silas [place] on call MD with immediate call back MD aware and orders Lantus held and aware pt is refusing to take all 2200  medications

## 2018-08-03 NOTE — Progress Notes (Signed)
Pt BG 373. Insulin sliding scale order is for 9 units. Pt refused and stated the pump calculated she needs 4 units for BG of 373. Arrien, MD notified.

## 2018-08-03 NOTE — Progress Notes (Signed)
BG 225. MD ordered 12 units. Pt refused 12 units and stated her pump says to only give 4 units for correction of BG of 225. Arrien, MD notified.

## 2018-08-04 ENCOUNTER — Telehealth: Payer: Self-pay

## 2018-08-04 ENCOUNTER — Inpatient Hospital Stay (HOSPITAL_COMMUNITY): Payer: Medicare Other | Admitting: Certified Registered Nurse Anesthetist

## 2018-08-04 ENCOUNTER — Encounter (HOSPITAL_COMMUNITY)
Admission: EM | Disposition: A | Discharge status: Home Health | Payer: Self-pay | Source: Home / Self Care | Attending: Internal Medicine

## 2018-08-04 DIAGNOSIS — L089 Local infection of the skin and subcutaneous tissue, unspecified: Secondary | ICD-10-CM

## 2018-08-04 DIAGNOSIS — E1069 Type 1 diabetes mellitus with other specified complication: Secondary | ICD-10-CM

## 2018-08-04 DIAGNOSIS — Z992 Dependence on renal dialysis: Secondary | ICD-10-CM

## 2018-08-04 DIAGNOSIS — E1040 Type 1 diabetes mellitus with diabetic neuropathy, unspecified: Secondary | ICD-10-CM

## 2018-08-04 DIAGNOSIS — Z881 Allergy status to other antibiotic agents status: Secondary | ICD-10-CM

## 2018-08-04 DIAGNOSIS — H547 Unspecified visual loss: Secondary | ICD-10-CM

## 2018-08-04 DIAGNOSIS — D631 Anemia in chronic kidney disease: Secondary | ICD-10-CM | POA: Insufficient documentation

## 2018-08-04 DIAGNOSIS — E1022 Type 1 diabetes mellitus with diabetic chronic kidney disease: Secondary | ICD-10-CM

## 2018-08-04 DIAGNOSIS — E10621 Type 1 diabetes mellitus with foot ulcer: Secondary | ICD-10-CM

## 2018-08-04 DIAGNOSIS — B9561 Methicillin susceptible Staphylococcus aureus infection as the cause of diseases classified elsewhere: Secondary | ICD-10-CM

## 2018-08-04 DIAGNOSIS — N189 Chronic kidney disease, unspecified: Secondary | ICD-10-CM | POA: Insufficient documentation

## 2018-08-04 DIAGNOSIS — R011 Cardiac murmur, unspecified: Secondary | ICD-10-CM

## 2018-08-04 DIAGNOSIS — N2581 Secondary hyperparathyroidism of renal origin: Secondary | ICD-10-CM | POA: Insufficient documentation

## 2018-08-04 DIAGNOSIS — M86671 Other chronic osteomyelitis, right ankle and foot: Secondary | ICD-10-CM

## 2018-08-04 DIAGNOSIS — D689 Coagulation defect, unspecified: Secondary | ICD-10-CM | POA: Insufficient documentation

## 2018-08-04 DIAGNOSIS — M869 Osteomyelitis, unspecified: Secondary | ICD-10-CM

## 2018-08-04 DIAGNOSIS — Z888 Allergy status to other drugs, medicaments and biological substances status: Secondary | ICD-10-CM

## 2018-08-04 DIAGNOSIS — R7881 Bacteremia: Secondary | ICD-10-CM

## 2018-08-04 DIAGNOSIS — B9562 Methicillin resistant Staphylococcus aureus infection as the cause of diseases classified elsewhere: Secondary | ICD-10-CM

## 2018-08-04 DIAGNOSIS — L97509 Non-pressure chronic ulcer of other part of unspecified foot with unspecified severity: Secondary | ICD-10-CM

## 2018-08-04 DIAGNOSIS — E10628 Type 1 diabetes mellitus with other skin complications: Secondary | ICD-10-CM

## 2018-08-04 DIAGNOSIS — N186 End stage renal disease: Secondary | ICD-10-CM

## 2018-08-04 HISTORY — PX: I & D EXTREMITY: SHX5045

## 2018-08-04 LAB — CULTURE, BLOOD (ROUTINE X 2): Special Requests: ADEQUATE

## 2018-08-04 LAB — RENAL FUNCTION PANEL
Albumin: 1.5 g/dL — ABNORMAL LOW (ref 3.5–5.0)
Anion gap: 16 — ABNORMAL HIGH (ref 5–15)
BUN: 33 mg/dL — ABNORMAL HIGH (ref 6–20)
CO2: 25 mmol/L (ref 22–32)
Calcium: 6.3 mg/dL — CL (ref 8.9–10.3)
Chloride: 94 mmol/L — ABNORMAL LOW (ref 98–111)
Creatinine, Ser: 4.25 mg/dL — ABNORMAL HIGH (ref 0.44–1.00)
GFR calc Af Amer: 14 mL/min — ABNORMAL LOW (ref >60)
GFR calc non Af Amer: 12 mL/min — ABNORMAL LOW (ref >60)
Glucose, Bld: 302 mg/dL — ABNORMAL HIGH (ref 70–99)
Phosphorus: 4 mg/dL (ref 2.5–4.6)
Potassium: 4.1 mmol/L (ref 3.5–5.1)
Sodium: 135 mmol/L (ref 135–145)

## 2018-08-04 LAB — CBC WITH DIFFERENTIAL/PLATELET
Abs Immature Granulocytes: 0.55 10*3/uL — ABNORMAL HIGH (ref 0.00–0.07)
Basophils Absolute: 0 10*3/uL (ref 0.0–0.1)
Basophils Relative: 0 %
Eosinophils Absolute: 0.3 10*3/uL (ref 0.0–0.5)
Eosinophils Relative: 2 %
HCT: 27 % — ABNORMAL LOW (ref 36.0–46.0)
Hemoglobin: 8.8 g/dL — ABNORMAL LOW (ref 12.0–15.0)
Immature Granulocytes: 5 %
Lymphocytes Relative: 13 %
Lymphs Abs: 1.5 10*3/uL (ref 0.7–4.0)
MCH: 27.4 pg (ref 26.0–34.0)
MCHC: 32.6 g/dL (ref 30.0–36.0)
MCV: 84.1 fL (ref 80.0–100.0)
Monocytes Absolute: 1.1 10*3/uL — ABNORMAL HIGH (ref 0.1–1.0)
Monocytes Relative: 10 %
Neutro Abs: 7.8 10*3/uL — ABNORMAL HIGH (ref 1.7–7.7)
Neutrophils Relative %: 70 %
Platelets: 445 10*3/uL — ABNORMAL HIGH (ref 150–400)
RBC: 3.21 MIL/uL — ABNORMAL LOW (ref 3.87–5.11)
RDW: 13.9 % (ref 11.5–15.5)
WBC: 11.2 10*3/uL — ABNORMAL HIGH (ref 4.0–10.5)
nRBC: 0 % (ref 0.0–0.2)

## 2018-08-04 LAB — GLUCOSE, CAPILLARY
Glucose-Capillary: 456 mg/dL — ABNORMAL HIGH (ref 70–99)
Glucose-Capillary: 449 mg/dL — ABNORMAL HIGH (ref 70–99)
Glucose-Capillary: 173 mg/dL — ABNORMAL HIGH (ref 70–99)
Glucose-Capillary: 212 mg/dL — ABNORMAL HIGH (ref 70–99)
Glucose-Capillary: 279 mg/dL — ABNORMAL HIGH (ref 70–99)

## 2018-08-04 LAB — HEPATITIS B CORE ANTIBODY, TOTAL: Hep B Core Total Ab: NEGATIVE

## 2018-08-04 LAB — HEPATITIS B SURFACE ANTIGEN: Hepatitis B Surface Ag: NEGATIVE

## 2018-08-04 LAB — HEPATITIS B SURFACE ANTIBODY,QUALITATIVE: Hep B S Ab: NONREACTIVE

## 2018-08-04 SURGERY — IRRIGATION AND DEBRIDEMENT EXTREMITY
Anesthesia: General | Laterality: Right

## 2018-08-04 MED ORDER — FENTANYL CITRATE (PF) 100 MCG/2ML IJ SOLN
INTRAMUSCULAR | Status: DC | PRN
  Administered 2018-08-04: 50 ug via INTRAVENOUS

## 2018-08-04 MED ORDER — DEXAMETHASONE SODIUM PHOSPHATE 10 MG/ML IJ SOLN
INTRAMUSCULAR | Status: AC
  Filled 2018-08-04: qty 1

## 2018-08-04 MED ORDER — INSULIN ASPART 100 UNIT/ML ~~LOC~~ SOLN
4.0000 [IU] | SUBCUTANEOUS | Status: AC
  Administered 2018-08-04: 4 [IU] via SUBCUTANEOUS

## 2018-08-04 MED ORDER — HYDROMORPHONE HCL 1 MG/ML IJ SOLN
0.2500 mg | INTRAMUSCULAR | Status: DC | PRN
  Administered 2018-08-04 (×2): 0.5 mg via INTRAVENOUS

## 2018-08-04 MED ORDER — ONDANSETRON HCL 4 MG/2ML IJ SOLN
INTRAMUSCULAR | Status: AC
  Filled 2018-08-04: qty 2

## 2018-08-04 MED ORDER — INSULIN ASPART 100 UNIT/ML ~~LOC~~ SOLN
0.0000 [IU] | Freq: Three times a day (TID) | SUBCUTANEOUS | Status: DC
  Administered 2018-08-04: 5 [IU] via SUBCUTANEOUS
  Administered 2018-08-05: 2 [IU] via SUBCUTANEOUS

## 2018-08-04 MED ORDER — SODIUM CHLORIDE 0.9 % IR SOLN
Status: DC | PRN
  Administered 2018-08-04: 1000 mL

## 2018-08-04 MED ORDER — PROMETHAZINE HCL 25 MG/ML IJ SOLN: 6.2500 mg | INTRAMUSCULAR | Status: DC | PRN

## 2018-08-04 MED ORDER — SUCCINYLCHOLINE CHLORIDE 200 MG/10ML IV SOSY
PREFILLED_SYRINGE | INTRAVENOUS | Status: AC
  Filled 2018-08-04: qty 10

## 2018-08-04 MED ORDER — INSULIN ASPART 100 UNIT/ML ~~LOC~~ SOLN
2.0000 [IU] | Freq: Three times a day (TID) | SUBCUTANEOUS | Status: DC
  Administered 2018-08-05: 2 [IU] via SUBCUTANEOUS

## 2018-08-04 MED ORDER — SODIUM CHLORIDE 0.9 % IR SOLN
Status: DC | PRN
  Administered 2018-08-04: 3000 mL

## 2018-08-04 MED ORDER — SUCCINYLCHOLINE CHLORIDE 200 MG/10ML IV SOSY
PREFILLED_SYRINGE | INTRAVENOUS | Status: DC | PRN
  Administered 2018-08-04: 80 mg via INTRAVENOUS

## 2018-08-04 MED ORDER — INSULIN ASPART 100 UNIT/ML ~~LOC~~ SOLN: 0.0000 [IU] | Freq: Every day | SUBCUTANEOUS | Status: DC

## 2018-08-04 MED ORDER — INSULIN ASPART 100 UNIT/ML ~~LOC~~ SOLN
4.0000 [IU] | Freq: Once | SUBCUTANEOUS | Status: AC
  Administered 2018-08-04: 4 [IU] via SUBCUTANEOUS

## 2018-08-04 MED ORDER — PROPOFOL 10 MG/ML IV BOLUS
INTRAVENOUS | Status: AC
  Filled 2018-08-04: qty 20

## 2018-08-04 MED ORDER — PROPOFOL 10 MG/ML IV BOLUS
INTRAVENOUS | Status: DC | PRN
  Administered 2018-08-04: 140 mg via INTRAVENOUS

## 2018-08-04 MED ORDER — INSULIN ASPART 100 UNIT/ML ~~LOC~~ SOLN: 10.0000 [IU] | Freq: Once | SUBCUTANEOUS | Status: DC

## 2018-08-04 MED ORDER — LIDOCAINE 2% (20 MG/ML) 5 ML SYRINGE
INTRAMUSCULAR | Status: AC
  Filled 2018-08-04: qty 10

## 2018-08-04 MED ORDER — LIDOCAINE 2% (20 MG/ML) 5 ML SYRINGE
INTRAMUSCULAR | Status: DC | PRN
  Administered 2018-08-04: 30 mg via INTRAVENOUS

## 2018-08-04 MED ORDER — BUPIVACAINE HCL (PF) 0.5 % IJ SOLN
INTRAMUSCULAR | Status: AC
  Filled 2018-08-04: qty 30

## 2018-08-04 MED ORDER — FENTANYL CITRATE (PF) 250 MCG/5ML IJ SOLN
INTRAMUSCULAR | Status: AC
  Filled 2018-08-04: qty 5

## 2018-08-04 MED ORDER — VANCOMYCIN HCL IN DEXTROSE 500-5 MG/100ML-% IV SOLN
500.0000 mg | Freq: Once | INTRAVENOUS | Status: AC
  Administered 2018-08-04: 17:00:00 500 mg via INTRAVENOUS
  Filled 2018-08-04 (×2): qty 100

## 2018-08-04 MED ORDER — LIDOCAINE HCL 2 % IJ SOLN
INTRAMUSCULAR | Status: AC
  Filled 2018-08-04: qty 20

## 2018-08-04 MED ORDER — VANCOMYCIN HCL IN DEXTROSE 500-5 MG/100ML-% IV SOLN
INTRAVENOUS | Status: AC
  Administered 2018-08-04: 500 mg via INTRAVENOUS
  Filled 2018-08-04: qty 100

## 2018-08-04 MED ORDER — OXYCODONE HCL 5 MG/5ML PO SOLN: 5.0000 mg | Freq: Once | ORAL | Status: DC | PRN

## 2018-08-04 MED ORDER — HYDROMORPHONE HCL 1 MG/ML IJ SOLN
INTRAMUSCULAR | Status: AC
  Filled 2018-08-04: qty 1

## 2018-08-04 MED ORDER — OXYCODONE HCL 5 MG PO TABS: 5.0000 mg | ORAL_TABLET | Freq: Once | ORAL | Status: DC | PRN

## 2018-08-04 MED ORDER — INSULIN ASPART 100 UNIT/ML ~~LOC~~ SOLN
5.0000 [IU] | Freq: Once | SUBCUTANEOUS | Status: AC
  Administered 2018-08-04: 5 [IU] via SUBCUTANEOUS

## 2018-08-04 MED ORDER — HEPARIN SODIUM (PORCINE) 1000 UNIT/ML IJ SOLN
INTRAMUSCULAR | Status: AC
  Filled 2018-08-04: qty 3

## 2018-08-04 MED ORDER — ONDANSETRON HCL 4 MG/2ML IJ SOLN
INTRAMUSCULAR | Status: DC | PRN
  Administered 2018-08-04: 4 mg via INTRAVENOUS

## 2018-08-04 SURGICAL SUPPLY — 43 items
BANDAGE ACE 4X5 VEL STRL LF (GAUZE/BANDAGES/DRESSINGS) ×1 IMPLANT
BLADE SURG 15 STRL LF DISP TIS (BLADE) ×3 IMPLANT
BLADE SURG 15 STRL SS (BLADE) ×6
BNDG CMPR 9X4 STRL LF SNTH (GAUZE/BANDAGES/DRESSINGS) ×1
BNDG ESMARK 4X9 LF (GAUZE/BANDAGES/DRESSINGS) ×2 IMPLANT
BNDG GAUZE ELAST 4 BULKY (GAUZE/BANDAGES/DRESSINGS) ×3 IMPLANT
CANISTER WOUNDNEG PRESSURE 500 (CANNISTER) ×1 IMPLANT
CHLORAPREP W/TINT 26ML (MISCELLANEOUS) ×2 IMPLANT
COVER SURGICAL LIGHT HANDLE (MISCELLANEOUS) ×2 IMPLANT
COVER WAND RF STERILE (DRAPES) ×2 IMPLANT
DRAPE U-SHAPE 47X51 STRL (DRAPES) ×2 IMPLANT
DRSG VAC ATS MED SENSATRAC (GAUZE/BANDAGES/DRESSINGS) ×1 IMPLANT
ELECT CAUTERY BLADE 6.4 (BLADE) ×2 IMPLANT
ELECT REM PT RETURN 9FT ADLT (ELECTROSURGICAL)
ELECTRODE REM PT RTRN 9FT ADLT (ELECTROSURGICAL) IMPLANT
GAUZE SPONGE 4X4 12PLY STRL (GAUZE/BANDAGES/DRESSINGS) ×2 IMPLANT
GLOVE BIO SURGEON STRL SZ8 (GLOVE) ×2 IMPLANT
GLOVE BIOGEL PI IND STRL 8 (GLOVE) ×1 IMPLANT
GLOVE BIOGEL PI INDICATOR 8 (GLOVE) ×1
GOWN STRL REUS W/ TWL LRG LVL3 (GOWN DISPOSABLE) ×2 IMPLANT
GOWN STRL REUS W/TWL LRG LVL3 (GOWN DISPOSABLE) ×4
HANDPIECE INTERPULSE COAX TIP (DISPOSABLE)
JET LAVAGE IRRISEPT WOUND (IRRIGATION / IRRIGATOR) ×2
KIT BASIN OR (CUSTOM PROCEDURE TRAY) ×2 IMPLANT
KIT TURNOVER KIT B (KITS) ×2 IMPLANT
LAVAGE JET IRRISEPT WOUND (IRRIGATION / IRRIGATOR) IMPLANT
MANIFOLD NEPTUNE II (INSTRUMENTS) ×2 IMPLANT
NS IRRIG 1000ML POUR BTL (IV SOLUTION) ×2 IMPLANT
PACK ORTHO EXTREMITY (CUSTOM PROCEDURE TRAY) ×2 IMPLANT
PAD ARMBOARD 7.5X6 YLW CONV (MISCELLANEOUS) ×4 IMPLANT
PAD CAST 4YDX4 CTTN HI CHSV (CAST SUPPLIES) ×1 IMPLANT
PADDING CAST COTTON 4X4 STRL (CAST SUPPLIES) ×2
SCRUB BETADINE 4OZ XXX (MISCELLANEOUS) ×2 IMPLANT
SET HNDPC FAN SPRY TIP SCT (DISPOSABLE) IMPLANT
SOL PREP POV-IOD 4OZ 10% (MISCELLANEOUS) ×2 IMPLANT
STAPLER VISISTAT 35W (STAPLE) ×2 IMPLANT
STOCKINETTE IMPERVIOUS 9X36 MD (GAUZE/BANDAGES/DRESSINGS) ×2 IMPLANT
SWAB CULTURE LIQ STUART DBL (MISCELLANEOUS) ×2 IMPLANT
SWAB CULTURE LIQUID MINI MALE (MISCELLANEOUS) ×2 IMPLANT
TOWEL OR 17X24 6PK STRL BLUE (TOWEL DISPOSABLE) ×2 IMPLANT
TOWEL OR 17X26 10 PK STRL BLUE (TOWEL DISPOSABLE) ×2 IMPLANT
TUBE CONNECTING 12X1/4 (SUCTIONS) ×2 IMPLANT
YANKAUER SUCT BULB TIP NO VENT (SUCTIONS) ×2 IMPLANT

## 2018-08-04 NOTE — Telephone Encounter (Signed)
Pt called this office and stated that she has been admitted to the hospital since Tuesday the 28'th. Pt stated that she has not been using her pump because she cannot see very well right now because she missed her appt to get shots in her eyes r/t the hospitalization. She stated that she has been comatose once r/t low blood sugars. Pt stated that "this hospital is trying to kill me. My blood sugar is over 400 and they want to give me 20 units of insulin. If they do this, I will die." Pt was informed and reassured that any doctor has her health and wellbeing as a top priority and they will never intentionally over medicate. Pt stated that she has been refusing the insulin being offered based of the treating physicians sliding scale orders. Pt wants Dr. Dwyane Dee to provide orders for blood sugars. Pt was informed that the treating physician must first place an order stating that Dr. Dwyane Dee is permitted to do this, and even then it is at Dr. Ronnie Derby discretion as to whether he will give orders rather than allowing the treating physician to continue to prescribe insulin orders. Pt verbalized understanding of this and she was informed that Dr. Dwyane Dee would be informed of her request and will be called back with his decision. In the mean time, pt was advised to follow the instructions of the treating physician.

## 2018-08-04 NOTE — Progress Notes (Signed)
[facility] NEPHROLOGY PROGRESS NOTE  Assessment/ Plan: Pt is a 48 y.o. 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 Plan for dialysis today. -Evaluated by VS, plan for AV graft and permanent tunneled catheter on Wednesday, need to do dialysis after the surgery on Wednesday.   -Plan to remove temporary catheter today after dialysis  #Anemia of chronic disease: Continue ESA.  No iron because of sepsis.  #Right foot abscess status post I&D on 4/30, plan for OR today.  #Secondary hyperparathyroidism: Phosphorus level acceptable, calcium low.  Continue calcitriol, Tums and calcium acetate.  Monitor labs.  #Hypertension: On amlodipine.  Blood pressure acceptable  Subjective: Seen and examined at bedside.  Plan for OR noted.  Looks fatigued.  Denied nausea, vomiting, chest pain, shortness of breath. Objective Vital signs in last 24 hours: Vitals:   08/03/18 2001 08/03/18 2359 08/04/18 0359 08/04/18 0806  BP:    (!) 149/77  Pulse:    87  Resp:    (!) 22  Temp: 98.8 F (37.1 C) 99.1 F (37.3 C) 98.7 F (37.1 C) 98.8 F (37.1 C)  TempSrc: Oral Oral Oral Oral  SpO2:    99%  Weight:      Height:       Weight change:   Intake/Output Summary (Last 24 hours) at 08/04/2018 1309 Last data filed at 08/03/2018 1810 Gross per 24 hour  Intake 180 ml  Output -  Net 180 ml       Labs: Basic Metabolic Panel: Recent Labs  Lab 08/02/18 0400 08/03/18 0440 08/04/18 0416  NA 138 138 135  K 3.1* 3.9 4.1  CL 100 100 94*  CO2 26 25 25   GLUCOSE 171* 355* 302*  BUN 44* 22* 33*  CREATININE 4.31* 3.22* 4.25*  CALCIUM 7.0* 6.5* 6.3*  PHOS 4.1 4.4 4.0   Liver Function Tests: Recent Labs  Lab 07/29/18 1802 07/31/18 0300  08/02/18 0400 08/03/18 0440 08/04/18 0416  AST 16 27  --   --   --    --   ALT 18 19  --   --   --   --   ALKPHOS 249* 198*  --   --   --   --   BILITOT 0.4 1.8*  --   --   --   --   PROT 6.3* 5.4*  --   --   --   --   ALBUMIN 2.1* 1.8*   < > 1.5* 1.4* 1.5*   < > = values in this interval not displayed.   No results for input(s): LIPASE, AMYLASE in the last 168 hours. No results for input(s): AMMONIA in the last 168 hours. CBC: Recent Labs  Lab 07/29/18 1557 07/30/18 0104  07/31/18 0300  08/01/18 0249 08/03/18 0440 08/04/18 0416  WBC 24.7* 27.0*  --  21.5*  --  20.3* 11.5* 11.2*  NEUTROABS 22.8*  --   --   --   --   --   --  7.8*  HGB 8.5* 8.2*  --  6.8*   < > 8.6* 8.6* 8.8*  HCT 26.8* 25.3*   < > 20.3*   < > 25.2* 26.5* 27.0*  MCV 88.2 86.3  --  83.2  --  83.2 85.8 84.1  PLT 528* 597*  --  576*  --  516*  384 445*   < > = values in this interval not displayed.   Cardiac Enzymes: No results for input(s): CKTOTAL, CKMB, CKMBINDEX, TROPONINI in the last 168 hours. CBG: Recent Labs  Lab 08/03/18 1205 08/03/18 1735 08/03/18 2100 08/04/18 0801 08/04/18 1127  GLUCAP 350* 373* 307* 456* 449*    Iron Studies: No results for input(s): IRON, TIBC, TRANSFERRIN, FERRITIN in the last 72 hours. Studies/Results: No results found.  Medications: Infusions: . sodium chloride      Scheduled Medications: . amLODipine  5 mg Oral QHS  . calcitRIOL  0.5 mcg Oral Daily  . calcium acetate  1,334 mg Oral TID WC  . darbepoetin (ARANESP) injection - NON-DIALYSIS  200 mcg Subcutaneous Q Thu-1800  . heparin  5,000 Units Subcutaneous Q8H  . insulin aspart  0-5 Units Subcutaneous TID WC  . insulin aspart  0-5 Units Subcutaneous QHS  . insulin aspart  2 Units Subcutaneous TID WC  . insulin aspart  5 Units Subcutaneous Once  . insulin glargine  12 Units Subcutaneous QHS  . loratadine  10 mg Oral QHS  . oxyCODONE  10 mg Oral Once  . pravastatin  40 mg Oral Daily  . sodium chloride flush  10-40 mL Intracatheter Q12H  . sodium chloride flush  3 mL  Intravenous Q12H  . sodium chloride flush  3 mL Intravenous Q12H  . vancomycin  500 mg Intravenous Once  . vancomycin variable dose per unstable renal function (pharmacist dosing)   Does not apply See admin instructions    have reviewed scheduled and prn medications.  Physical Exam: General:NAD, comfortable Heart:RRR, s1s2 nl Lungs:clear b/l, no crackle Abdomen:soft, Non-tender, non-distended Extremities:No edema Dialysis Access: Temporary dialysis catheter.   Prasad  08/04/2018,1:09 PM  LOS: 6 days

## 2018-08-04 NOTE — H&P (View-Only) (Signed)
PROGRESS NOTE    JALYNN WADDELL  VQQ:[id number] DOB: 04/18/1970 DOA: 07/29/2018 PCP: Annye English    Brief Narrative:  48 year old female who presents with right foot edema and pain.  She does have significant past medical history of poorly controlled type 1 diabetes mellitus, diabetic foot, status post multiple amputations, hypertension and dyslipidemia.  Presented with 1 week history of worsening right lower extremity pain, erythema and edema, refractive to oral biotic therapy with doxycycline.  Positive fevers and chills.  On her initial physical examination her temperature was 100.1, heart rate 123, blood pressure 148/76, respiratory rate 20, oxygen saturation 100.  She had moist mucous membranes, her lungs are clear to auscultation bilaterally, heart S1-S2 present tachycardic, the abdomen was soft nontender, her right lower extremity was edematous, positive erythema.  Sodium 133, potassium 4.5, chloride 113, bicarb less than 7, glucose 61, BUN 101, creatinine 7.0, anion gap 14, white count 24.7, hemoglobin 8.5, hematocrit 26.8, platelets 528. MRI of the right ankle showed tenosynovitis of the anterior tibialis tendon and extensor hallucis longus tendon plus extending into the first tarsal joint. Osteomyelitis involving the medial cuneiform and bases of the first and second metatarsals.    Patient was admitted to the hospital with working diagnosis of sepsis due to right foot tenosynovitis/osteomyelitis.  Complicated by acute kidney injury chronic kidney disease.  Patient underwent incision and drainage of right foot abscess on April 30.  Her blood cultures grew MRSA.  She had progressive renal failure, underwent hemodialysis catheter placement and proceeded with renal replacement therapy.   Assessment & Plan:   Principal Problem:   Osteomyelitis of right foot ([place]) Active Problems:   Type 1 diabetes mellitus with neurological manifestations, uncontrolled ([place])   DM type 1  causing renal disease ([place])  1. Sepsis due to right foot tenosynovitis and osteomyelitis. MRSA bacteremia. Follow up blood cultures from 05.01 with no growth/ patient has declined BKA. Continue IV vancomycin per pharmacy protocol. Plan for HD catheter removal as part of catheter holiday, will place a tunnel catheter on 05.06.20.   2. 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 Plan for HD today, per nephrology recommendations.  On Phoslo, calcitriol, and darbepoetin. Her K today is 4,1 and serum bicarbonate at 25.   3. T1DM. Patient at home on insulin pump. Patient is very sensitive to insulin, now with modified insulin sliding scale continue basal insulin of 12 units and 3 units of pre-meal insulin. Her fasting glucose today is 302 and capillary glucose has been 307, 456 and 449. She has been declining higher doses of insulin.   4. HTN. On amlodipine, for blood pressure control.     DVT prophylaxis: heparin   Code Status: full Family Communication: no family at the bedside Disposition Plan/ discharge barriers: pending outpatient HD unit and further wound care.   Body mass index is 20.01 kg/m. Malnutrition Type:      Malnutrition Characteristics:      Nutrition Interventions:     RN Pressure Injury Documentation:     Consultants:   Nephrology  Podiatry   Procedures:   I&D right lower extremity.   Antimicrobials:   IV vancomycin.     Subjective: Patient has been declining insulin sliding scale, tolerating po well, nausea or vomiting. No significant pain on her right foot. No chest pain or dyspnea.   Objective: Vitals:   08/03/18 2001 08/03/18 2359 08/04/18 0359 08/04/18 0806  BP:    (!) 149/77  Pulse:    87  Resp:    (!) 22  Temp: 98.8 F (37.1 C) 99.1 F (37.3 C) 98.7 F (37.1 C) 98.8 F (37.1 C)  TempSrc: Oral Oral Oral Oral  SpO2:    99%  Weight:      Height:        Intake/Output Summary (Last 24 hours) at 08/04/2018 0909 Last data filed at 08/03/2018 1810  Gross per 24 hour  Intake 582 ml  Output -  Net 582 ml   Filed Weights   08/01/18 2214 08/02/18 0558 08/02/18 1542  Weight: 62.1 kg 60.7 kg 59.7 kg    Examination:   General: deconditioned  Neurology: Awake and alert, non focal  E ENT: no pallor, no icterus, oral mucosa moist Cardiovascular: No JVD. S1-S2 present, rhythmic, no gallops, rubs, or murmurs. Trace non pitting lower extremity edema. Pulmonary: positive breath sounds bilaterally, adequate air movement, no wheezing, rhonchi or rales. Gastrointestinal. Abdomen with no organomegaly, non tender, no rebound or guarding Skin. No rashes Musculoskeletal: right foot with dressing in place.  Right IJ HD catheter in place.     Data Reviewed: I have personally reviewed following labs and imaging studies  CBC: Recent Labs  Lab 07/29/18 1557 07/30/18 0104  07/31/18 0300 07/31/18 1629 08/01/18 0249 08/03/18 0440 08/04/18 0416  WBC 24.7* 27.0*  --  21.5*  --  20.3* 11.5* 11.2*  NEUTROABS 22.8*  --   --   --   --   --   --  7.8*  HGB 8.5* 8.2*  --  6.8* 8.2* 8.6* 8.6* 8.8*  HCT 26.8* 25.3*   < > 20.3* 24.0* 25.2* 26.5* 27.0*  MCV 88.2 86.3  --  83.2  --  83.2 85.8 84.1  PLT 528* 597*  --  576*  --  516* 384 445*   < > = values in this interval not displayed.   Basic Metabolic Panel: Recent Labs  Lab 07/30/18 0104  07/31/18 0651 07/31/18 1629 08/01/18 0249 08/02/18 0400 08/03/18 0440 08/04/18 0416  NA 135   < > 132* 133* 135 138 138 135  K 4.5   < > 4.4 4.2 4.2 3.1* 3.9 4.1  CL 109   < > 103  --  103 100 100 94*  CO2 7*   < > 12*  --  12* 26 25 25   GLUCOSE 147*   < > 137* 136* 156* 171* 355* 302*  BUN 102*   < > 109*  --  108* 44* 22* 33*  CREATININE 7.23*   < > 7.84*  --  7.84* 4.31* 3.22* 4.25*  CALCIUM 6.8*   < > 5.6*  --  5.5* 7.0* 6.5* 6.3*  MG 1.3*  --   --   --   --   --   --   --   PHOS 5.9*  --   --   --  7.1* 4.1 4.4 4.0   < > = values in this interval not displayed.   GFR: Estimated Creatinine  Clearance: 15.6 mL/min (A) (by C-G formula based on SCr of 4.25 mg/dL (H)). Liver Function Tests: Recent Labs  Lab 07/29/18 1802 07/31/18 0300 08/01/18 0249 08/02/18 0400 08/03/18 0440 08/04/18 0416  AST 16 27  --   --   --   --   ALT 18 19  --   --   --   --   ALKPHOS 249* 198*  --   --   --   --   BILITOT 0.4 1.8*  --   --   --   --  PROT 6.3* 5.4*  --   --   --   --   ALBUMIN 2.1* 1.8* 1.8* 1.5* 1.4* 1.5*   No results for input(s): LIPASE, AMYLASE in the last 168 hours. No results for input(s): AMMONIA in the last 168 hours. Coagulation Profile: Recent Labs  Lab 07/30/18 0104  INR 1.4*   Cardiac Enzymes: No results for input(s): CKTOTAL, CKMB, CKMBINDEX, TROPONINI in the last 168 hours. BNP (last 3 results) No results for input(s): PROBNP in the last 8760 hours. HbA1C: No results for input(s): HGBA1C in the last 72 hours. CBG: Recent Labs  Lab 08/03/18 0852 08/03/18 1205 08/03/18 1735 08/03/18 2100 08/04/18 0801  GLUCAP 425* 350* 373* 307* 456*   Lipid Profile: No results for input(s): CHOL, HDL, LDLCALC, TRIG, CHOLHDL, LDLDIRECT in the last 72 hours. Thyroid Function Tests: No results for input(s): TSH, T4TOTAL, FREET4, T3FREE, THYROIDAB in the last 72 hours. Anemia Panel: No results for input(s): VITAMINB12, FOLATE, FERRITIN, TIBC, IRON, RETICCTPCT in the last 72 hours.    Radiology Studies: I have reviewed all of the imaging during this hospital visit personally     Scheduled Meds: . amLODipine  5 mg Oral QHS  . calcitRIOL  0.5 mcg Oral Daily  . calcium acetate  1,334 mg Oral TID WC  . darbepoetin (ARANESP) injection - NON-DIALYSIS  200 mcg Subcutaneous Q Thu-1800  . heparin  5,000 Units Subcutaneous Q8H  . insulin aspart  0-5 Units Subcutaneous QHS  . insulin aspart  0-9 Units Subcutaneous TID WC  . insulin aspart  1 Units Subcutaneous TID WC  . insulin aspart  10 Units Subcutaneous Once  . insulin glargine  12 Units Subcutaneous QHS  .  loratadine  10 mg Oral QHS  . mupirocin ointment  1 application Nasal BID  . oxyCODONE  10 mg Oral Once  . pravastatin  40 mg Oral Daily  . sodium chloride flush  10-40 mL Intracatheter Q12H  . sodium chloride flush  3 mL Intravenous Q12H  . sodium chloride flush  3 mL Intravenous Q12H  . vancomycin variable dose per unstable renal function (pharmacist dosing)   Does not apply See admin instructions   Continuous Infusions: . sodium chloride       LOS: 6 days        Shuree Brossart Gerome Apley, MD

## 2018-08-04 NOTE — Transfer of Care (Signed)
Immediate Anesthesia Transfer of Care Note  Patient: [name]  Procedure(s) Performed: IRRIGATION AND DEBRIDEMENT FOOT (Right )  Patient Location: PACU  Anesthesia Type:General  Level of Consciousness: awake, alert  and oriented  Airway & Oxygen Therapy: Patient Spontanous Breathing  Post-op Assessment: Report given to RN  Post vital signs: Reviewed and stable  Last Vitals:  Vitals Value Taken Time  BP 156/87 08/04/2018  8:18 PM  Temp    Pulse 80 08/04/2018  8:19 PM  Resp 24 08/04/2018  8:19 PM  SpO2 98 % 08/04/2018  8:19 PM  Vitals shown include unvalidated device data.  Last Pain:  Vitals:   08/04/18 1821  TempSrc: Oral  PainSc: 0-No pain      Patients Stated Pain Goal: 2 (51/89/84 2103)  Complications: No apparent anesthesia complications

## 2018-08-04 NOTE — Progress Notes (Signed)
Responded to call from Education officer, museum for a consult with PT. PT was alert and seemingly irritated. PT was short and her behavior suggested that she did not want to talk. She came off irritated with her experience and was not engagingly open for spiritual support. However, I offered ministry of presence and stated that Chaplain is available upon request.  Colonel Bald  [phone]

## 2018-08-04 NOTE — Anesthesia Preprocedure Evaluation (Addendum)
Anesthesia Evaluation  Patient identified by MRN, date of birth, ID band Patient awake    Reviewed: Allergy & Precautions, H&P , NPO status , Patient's Chart, lab work & pertinent test results  Airway Mallampati: II  TM Distance: >3 FB Neck ROM: Full    Dental no notable dental hx. (+) Teeth Intact, Dental Advisory Given   Pulmonary sleep apnea , former smoker,    Pulmonary exam normal breath sounds clear to auscultation       Cardiovascular hypertension, Pt. on medications + Peripheral Vascular Disease  Normal cardiovascular exam Rhythm:Regular Rate:Normal     Neuro/Psych Anxiety negative neurological ROS     GI/Hepatic negative GI ROS, Neg liver ROS,   Endo/Other  diabetes, Type 1, Insulin Dependent  Renal/GU ESRFRenal disease  negative genitourinary   Musculoskeletal negative musculoskeletal ROS (+)   Abdominal   Peds negative pediatric ROS (+)  Hematology  (+) Blood dyscrasia, anemia ,   Anesthesia Other Findings   Reproductive/Obstetrics negative OB ROS                            Anesthesia Physical Anesthesia Plan  ASA: III  Anesthesia Plan: General   Post-op Pain Management:    Induction: Intravenous and Rapid sequence  PONV Risk Score and Plan: 3 and Ondansetron, Treatment may vary due to age or medical condition and Midazolam  Airway Management Planned: Oral ETT  Additional Equipment:   Intra-op Plan:   Post-operative Plan: Extubation in OR  Informed Consent: I have reviewed the patients History and Physical, chart, labs and discussed the procedure including the risks, benefits and alternatives for the proposed anesthesia with the patient or authorized representative who has indicated his/her understanding and acceptance.     Dental advisory given  Plan Discussed with: CRNA and Surgeon  Anesthesia Plan Comments:         Anesthesia Quick Evaluation

## 2018-08-04 NOTE — Progress Notes (Signed)
Pt is refusing scheduled insulin and coverage. Pt stated, "I have my own insulin pump that tells me how much I need and y'all don't know what I need because you are trying to give me too much." I explained to her that her pump could be inaccurate because it was set to give 3.4 units for a CBG of 456. She stated that her sugars are "always high" after she eats and the pump helps. Paged MD and diabetes coordinator. Will continue to monitor.

## 2018-08-04 NOTE — Telephone Encounter (Signed)
Pt was made aware that Dr. Dwyane Dee cannot visit her at the hospital during initial phone conversation and she is understanding of this.

## 2018-08-04 NOTE — Progress Notes (Signed)
DBIV consult: Pt is in dialysis. Spoke with Vincente Liberty, HD RN: she reports they have orders to draw Istat when treatment is done. RN made aware.

## 2018-08-04 NOTE — Progress Notes (Addendum)
PROGRESS NOTE    [name]  NUU:[id number] DOB: Feb 14, 1971 DOA: 07/29/2018 PCP: Annye English    Brief Narrative:  48 year old female who presents with right foot edema and pain.  She does have significant past medical history of poorly controlled type 1 diabetes mellitus, diabetic foot, status post multiple amputations, hypertension and dyslipidemia.  Presented with 1 week history of worsening right lower extremity pain, erythema and edema, refractive to oral biotic therapy with doxycycline.  Positive fevers and chills.  On her initial physical examination her temperature was 100.1, heart rate 123, blood pressure 148/76, respiratory rate 20, oxygen saturation 100.  She had moist mucous membranes, her lungs are clear to auscultation bilaterally, heart S1-S2 present tachycardic, the abdomen was soft nontender, her right lower extremity was edematous, positive erythema.  Sodium 133, potassium 4.5, chloride 113, bicarb less than 7, glucose 61, BUN 101, creatinine 7.0, anion gap 14, white count 24.7, hemoglobin 8.5, hematocrit 26.8, platelets 528. MRI of the right ankle showed tenosynovitis of the anterior tibialis tendon and extensor hallucis longus tendon plus extending into the first tarsal joint. Osteomyelitis involving the medial cuneiform and bases of the first and second metatarsals.    Patient was admitted to the hospital with working diagnosis of sepsis due to right foot tenosynovitis/osteomyelitis.  Complicated by acute kidney injury chronic kidney disease.  Patient underwent incision and drainage of right foot abscess on April 30.  Her blood cultures grew MRSA.  She had progressive renal failure, underwent hemodialysis catheter placement and proceeded with renal replacement therapy.   Assessment & Plan:   Principal Problem:   Osteomyelitis of right foot ([place]) Active Problems:   Type 1 diabetes mellitus with neurological manifestations, uncontrolled ([place])   DM type 1  causing renal disease ([place])  1. Sepsis due to right foot tenosynovitis and osteomyelitis. MRSA bacteremia. Follow up blood cultures from 05.01 with no growth/ patient has declined BKA. Continue IV vancomycin per pharmacy protocol. Plan for HD catheter removal as part of catheter holiday, will place a tunnel catheter on 05.06.20.   2. 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 Plan for HD today, per nephrology recommendations.  On Phoslo, calcitriol, and darbepoetin. Her K today is 4,1 and serum bicarbonate at 25.   3. T1DM. Patient at home on insulin pump. Patient is very sensitive to insulin, now with modified insulin sliding scale continue basal insulin of 12 units and 3 units of pre-meal insulin. Her fasting glucose today is 302 and capillary glucose has been 307, 456 and 449. She has been declining higher doses of insulin.   4. HTN. On amlodipine, for blood pressure control.     DVT prophylaxis: heparin   Code Status: full Family Communication: no family at the bedside Disposition Plan/ discharge barriers: pending outpatient HD unit and further wound care.   Body mass index is 20.01 kg/m. Malnutrition Type:      Malnutrition Characteristics:      Nutrition Interventions:     RN Pressure Injury Documentation:     Consultants:   Nephrology  Podiatry   Procedures:   I&D right lower extremity.   Antimicrobials:   IV vancomycin.     Subjective: Patient has been declining insulin sliding scale, tolerating po well, nausea or vomiting. No significant pain on her right foot. No chest pain or dyspnea.   Objective: Vitals:   08/03/18 2001 08/03/18 2359 08/04/18 0359 08/04/18 0806  BP:    (!) 149/77  Pulse:    87  Resp:    (!) 22  Temp: 98.8 F (37.1 C) 99.1 F (37.3 C) 98.7 F (37.1 C) 98.8 F (37.1 C)  TempSrc: Oral Oral Oral Oral  SpO2:    99%  Weight:      Height:        Intake/Output Summary (Last 24 hours) at 08/04/2018 0909 Last data filed at 08/03/2018 1810  Gross per 24 hour  Intake 582 ml  Output -  Net 582 ml   Filed Weights   08/01/18 2214 08/02/18 0558 08/02/18 1542  Weight: 62.1 kg 60.7 kg 59.7 kg    Examination:   General: deconditioned  Neurology: Awake and alert, non focal  E ENT: no pallor, no icterus, oral mucosa moist Cardiovascular: No JVD. S1-S2 present, rhythmic, no gallops, rubs, or murmurs. Trace non pitting lower extremity edema. Pulmonary: positive breath sounds bilaterally, adequate air movement, no wheezing, rhonchi or rales. Gastrointestinal. Abdomen with no organomegaly, non tender, no rebound or guarding Skin. No rashes Musculoskeletal: right foot with dressing in place.  Right IJ HD catheter in place.     Data Reviewed: I have personally reviewed following labs and imaging studies  CBC: Recent Labs  Lab 07/29/18 1557 07/30/18 0104  07/31/18 0300 07/31/18 1629 08/01/18 0249 08/03/18 0440 08/04/18 0416  WBC 24.7* 27.0*  --  21.5*  --  20.3* 11.5* 11.2*  NEUTROABS 22.8*  --   --   --   --   --   --  7.8*  HGB 8.5* 8.2*  --  6.8* 8.2* 8.6* 8.6* 8.8*  HCT 26.8* 25.3*   < > 20.3* 24.0* 25.2* 26.5* 27.0*  MCV 88.2 86.3  --  83.2  --  83.2 85.8 84.1  PLT 528* 597*  --  576*  --  516* 384 445*   < > = values in this interval not displayed.   Basic Metabolic Panel: Recent Labs  Lab 07/30/18 0104  07/31/18 0651 07/31/18 1629 08/01/18 0249 08/02/18 0400 08/03/18 0440 08/04/18 0416  NA 135   < > 132* 133* 135 138 138 135  K 4.5   < > 4.4 4.2 4.2 3.1* 3.9 4.1  CL 109   < > 103  --  103 100 100 94*  CO2 7*   < > 12*  --  12* 26 25 25   GLUCOSE 147*   < > 137* 136* 156* 171* 355* 302*  BUN 102*   < > 109*  --  108* 44* 22* 33*  CREATININE 7.23*   < > 7.84*  --  7.84* 4.31* 3.22* 4.25*  CALCIUM 6.8*   < > 5.6*  --  5.5* 7.0* 6.5* 6.3*  MG 1.3*  --   --   --   --   --   --   --   PHOS 5.9*  --   --   --  7.1* 4.1 4.4 4.0   < > = values in this interval not displayed.   GFR: Estimated Creatinine  Clearance: 15.6 mL/min (A) (by C-G formula based on SCr of 4.25 mg/dL (H)). Liver Function Tests: Recent Labs  Lab 07/29/18 1802 07/31/18 0300 08/01/18 0249 08/02/18 0400 08/03/18 0440 08/04/18 0416  AST 16 27  --   --   --   --   ALT 18 19  --   --   --   --   ALKPHOS 249* 198*  --   --   --   --   BILITOT 0.4 1.8*  --   --   --   --  PROT 6.3* 5.4*  --   --   --   --   ALBUMIN 2.1* 1.8* 1.8* 1.5* 1.4* 1.5*   No results for input(s): LIPASE, AMYLASE in the last 168 hours. No results for input(s): AMMONIA in the last 168 hours. Coagulation Profile: Recent Labs  Lab 07/30/18 0104  INR 1.4*   Cardiac Enzymes: No results for input(s): CKTOTAL, CKMB, CKMBINDEX, TROPONINI in the last 168 hours. BNP (last 3 results) No results for input(s): PROBNP in the last 8760 hours. HbA1C: No results for input(s): HGBA1C in the last 72 hours. CBG: Recent Labs  Lab 08/03/18 0852 08/03/18 1205 08/03/18 1735 08/03/18 2100 08/04/18 0801  GLUCAP 425* 350* 373* 307* 456*   Lipid Profile: No results for input(s): CHOL, HDL, LDLCALC, TRIG, CHOLHDL, LDLDIRECT in the last 72 hours. Thyroid Function Tests: No results for input(s): TSH, T4TOTAL, FREET4, T3FREE, THYROIDAB in the last 72 hours. Anemia Panel: No results for input(s): VITAMINB12, FOLATE, FERRITIN, TIBC, IRON, RETICCTPCT in the last 72 hours.    Radiology Studies: I have reviewed all of the imaging during this hospital visit personally     Scheduled Meds: . amLODipine  5 mg Oral QHS  . calcitRIOL  0.5 mcg Oral Daily  . calcium acetate  1,334 mg Oral TID WC  . darbepoetin (ARANESP) injection - NON-DIALYSIS  200 mcg Subcutaneous Q Thu-1800  . heparin  5,000 Units Subcutaneous Q8H  . insulin aspart  0-5 Units Subcutaneous QHS  . insulin aspart  0-9 Units Subcutaneous TID WC  . insulin aspart  1 Units Subcutaneous TID WC  . insulin aspart  10 Units Subcutaneous Once  . insulin glargine  12 Units Subcutaneous QHS  .  loratadine  10 mg Oral QHS  . mupirocin ointment  1 application Nasal BID  . oxyCODONE  10 mg Oral Once  . pravastatin  40 mg Oral Daily  . sodium chloride flush  10-40 mL Intracatheter Q12H  . sodium chloride flush  3 mL Intravenous Q12H  . sodium chloride flush  3 mL Intravenous Q12H  . vancomycin variable dose per unstable renal function (pharmacist dosing)   Does not apply See admin instructions   Continuous Infusions: . sodium chloride       LOS: 6 days        Mauricio Gerome Apley, MD

## 2018-08-04 NOTE — Progress Notes (Signed)
Pt's CBG is 456. Paged MD. Awaiting orders.

## 2018-08-04 NOTE — H&P (View-Only) (Signed)
[facility]  Subjective  - Very frustrated about her health and the big changes with her needing so many procedures.   Objective (!) 149/77 87 98.8 F (37.1 C) (Oral) (!) 22 99%  Intake/Output Summary (Last 24 hours) at 08/04/2018 1114 Last data filed at 08/03/2018 1810 Gross per 24 hour  Intake 402 ml  Output -  Net 402 ml    B UE with palpable radial pulses Right IJ temp catheter in place Right foot/TMA with infected wound leukocytosis improved WBC now 11.2  Assessment/Planning: Vein mapping shows no reasonable vein for fistula creation. Plan for left AV graft placement and exchange of temp cath for [facility] Wed. 09/2018 with Dr. Donnetta Hutching  I called the HD coordinator in the dialysis lab and asked if they could do HD in the afternoon after surgery. 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 DPM  Roxy Horseman 08/04/2018 11:14 AM --  Laboratory Lab Results: Recent Labs    08/03/18 0440 08/04/18 0416  WBC 11.5* 11.2*  HGB 8.6* 8.8*  HCT 26.5* 27.0*  PLT 384 445*   BMET Recent Labs    08/03/18 0440 08/04/18 0416  NA 138 135  K 3.9 4.1  CL 100 94*  CO2 25 25  GLUCOSE 355* 302*  BUN 22* 33*  CREATININE 3.22* 4.25*  CALCIUM 6.5* 6.3*    COAG Lab Results  Component Value Date   INR 1.4 (H) 07/30/2018   INR 1.65 (H) 09/01/2014   INR 1.07 03/25/2013   No results found for: PTT  I have examined the patient, reviewed and agree with above.  White blood cell count has improved.  Difficult problem overall.  Patient is very upset about multiple issues including renal failure.  I discussed the very temporary nature of her right IJ catheter and the importance of converting this to a tunneled catheter now that it is safe.  Also recommend long-term arm access.  Her vein map shows extremely small cephalic and basilic veins bilaterally making it very  unlikely she would be a fistula candidate.  She is right-hand dominant and prefers left arm access.  I have recommended left arm access.  Splane that we would look at this with ultrasound at surgery but in all likelihood will require prosthetic Gore-Tex graft.  We have scheduled this for Wednesday she agrees currently.  Curt Jews, MD 08/04/2018 12:31 PM

## 2018-08-04 NOTE — Anesthesia Procedure Notes (Signed)
Procedure Name: Intubation Date/Time: 08/04/2018 7:29 PM Performed by: Eligha Bridegroom, CRNA Pre-anesthesia Checklist: Patient identified, Emergency Drugs available, Suction available, Patient being monitored and Timeout performed Patient Re-evaluated:Patient Re-evaluated prior to induction Oxygen Delivery Method: Circle system utilized Preoxygenation: Pre-oxygenation with 100% oxygen Induction Type: IV induction Laryngoscope Size: Mac and 3 Grade View: Grade II Tube type: Oral Tube size: 7.0 mm Number of attempts: 1 Airway Equipment and Method: Stylet Placement Confirmation: ETT inserted through vocal cords under direct vision,  positive ETCO2 and breath sounds checked- equal and bilateral Secured at: 21 cm Tube secured with: Tape Dental Injury: Teeth and Oropharynx as per pre-operative assessment

## 2018-08-04 NOTE — Telephone Encounter (Signed)
Noted.  Currently not going to the hospital and cannot participate in her care but her hospitalist can consult me on the phone

## 2018-08-04 NOTE — Progress Notes (Signed)
Renal Navigator called patient to discuss referral for OP HD. Patient sounded irritable, but willing to discuss plans. She states frustration with her medical situation and feels like she is being told to "sit in the corner and die." Throughout our discussion, it became very apparent that patient is feeling that all news is negative and that she is having a difficult time processing so much difficult diagnoses all at once. She also comments that "I have no friends and family," when Renal Navigator asked about transportation to OP HD. Renal Navigator suggested a visit from [place] so she isn't so alone in this experience. Patient was receptive. Renal Navigator called Spiritual Care department to request visit to patient. Renal Navigator questions whether a Palliative Care consult could help support patient in coordinating a discussion of her various diagnoses and help patient focus on improving her quality of life, given her new limitations, to hopefully find some relief from the symptoms and stress of these serious illnesses she is facing. Renal Navigator plans to discuss with team. Supportive brief counseling offered to patient while she spoke about how much has changed in her life in such a short amount of time and how much she wants to get out of the hospital and get back home. Renal Navigator completed referral to [facility] for OP HD. Renal Navigator requests latest seat possible as patient states "I sleep all day and I'm up all night." Renal Navigator informed patient that her request will be made, but that the seat is based on availability at the clinic. Renal Navigator explained that the latest seat time is typically an 11am-12pm start time. Patient was dissatisfied with this, but stated understanding.  Since patient does not have a natural support system to help with transportation, Renal Navigator will assist with SCAT application. Patient does not want to use SCAT because she knows  she has to be available up to an hour and a half prior to her appointment before and after, but again, stated understanding.  Renal Navigator will follow closely until an OP HD seat is secured and until discharge to assist with a smooth transition from hospital to OP HD clinic start.  Alphonzo Cruise Renal Navigator [phone]

## 2018-08-04 NOTE — Anesthesia Postprocedure Evaluation (Signed)
Anesthesia Post Note  Patient: [name]  Procedure(s) Performed: IRRIGATION AND DEBRIDEMENT FOOT with placement of wound vac (Right )     Patient location during evaluation: PACU Anesthesia Type: General Level of consciousness: awake and alert Pain management: pain level controlled Vital Signs Assessment: post-procedure vital signs reviewed and stable Respiratory status: spontaneous breathing, nonlabored ventilation and respiratory function stable Cardiovascular status: blood pressure returned to baseline and stable Postop Assessment: no apparent nausea or vomiting Anesthetic complications: no    Last Vitals:  Vitals:   08/04/18 1800 08/04/18 1821  BP: 138/72 (!) 161/82  Pulse: 78 80  Resp:  20  Temp:  36.8 C  SpO2:  98%    Last Pain:  Vitals:   08/04/18 1821  TempSrc: Oral  PainSc: 0-No pain                 Declyn Delsol,W. EDMOND

## 2018-08-04 NOTE — Brief Op Note (Signed)
07/29/2018 - 08/04/2018  8:16 PM  PATIENT:  [name]  48 y.o. female  PRE-OPERATIVE DIAGNOSIS:  infected right foot. Status post incision and drainage right foot  POST-OPERATIVE DIAGNOSIS:  same PROCEDURE:  Procedure(s): IRRIGATION AND DEBRIDEMENT FOOT (Right)  SURGEON:  Surgeon(s) and Role:    Edrick Kins, DPM - Primary  PHYSICIAN ASSISTANT: none  ASSISTANTS: none   ANESTHESIA:   IV sedation  EBL:  0 mL   BLOOD ADMINISTERED:none  DRAINS: none   LOCAL MEDICATIONS USED:  NONE  SPECIMEN:  No Specimen  DISPOSITION OF SPECIMEN:  N/A  COUNTS:  YES  TOURNIQUET:  * No tourniquets in log *  DICTATION: .Dragon Dictation  PLAN OF CARE: Admit to inpatient   PATIENT DISPOSITION:  PACU - hemodynamically stable.   Delay start of Pharmacological VTE agent (>24hrs) due to surgical blood loss or risk of bleeding: not applicable  Edrick Kins, DPM [facility]  Dr. Edrick Kins, DPM    [address] [place], [place] [address]                Office [phone]  Fax [phone]

## 2018-08-04 NOTE — Progress Notes (Addendum)
Inpatient Diabetes Program Recommendations  AACE/ADA: New Consensus Statement on Inpatient Glycemic Control (2015)  Target Ranges:  Prepandial:   less than 140 mg/dL      Peak postprandial:   less than 180 mg/dL (1-2 hours)      Critically ill patients:  140 - 180 mg/dL   Lab Results  Component Value Date   GLUCAP 456 (H) 08/04/2018   HGBA1C 10.4 (H) 07/30/2018    Review of Glycemic Control  Diabetes history: DM 1, Sees Dr. Dwyane Dee Outpatient Diabetes medications: Insulin pump   Inpatient Diabetes Program Recommendations:    Spoke with patient over the phone at RN request. Patient refusing insulin here. She mentions it is way too much ordered. Patient expresses discontent and wishes to use her settings per Dr. Dwyane Dee as he is her Endocrinology. Patient also reports discontent on how she is unable to get insulin pump supplies since her pump is off and how paperwork is not being filled out correctly outpatient. Patient reporting so "its all my problem now since I can't get the paperwork filled out right by my doctor."  Patient per settings is very sensitive to insulin    Basal rates:  Midnight to 3 AM = 0.5,  at 3 AM = 0.4 6 AM = 0.4  11:30 AM = 0.8 until 10 PM 10 PM = 0.75   Total basal insulin in 24 hours 14.8 units.   Carb coverage 1: 20 g  Correction 1: 70 during the day and 80 from 10 PM-8 AM Glucose target 150 Active insulin time 4 hours  Based on her insulin pump settings and request. Would recommend patient getting 4 units of Novolog now.   Also consider Novolog Custom Correction starting at 150 mg/dl. Novolog 2 units tid meal coverage.  -Custom Novolog correction scale 0-5 units       151-200  1 unit      201-250  2 units      251-300  3 units      301-350  4 units      351-400  5 units  Agree with Basal insulin dose for now. If still elevated tomorrow may go up to 14 units.  Sent Page to Dr. Cathlean Sauer to discuss plan of care for patient. Waiting call back.  Also sent Secure chat message.  Discussed plan of care with Dr. Cathlean Sauer. Per MD will adjust insulin SQ regimen to closely fit insulin pump settings. Will order Novolog 0-5 units custom correction scale, Novolog 3 units tid meal coverage. Will also order Novolog 4 units to give now for glucose > 400. Basal insulin to remain at current dose.   Noted patient refused basal insulin last night.  Thanks,  Tama Headings RN, MSN, BC-ADM Inpatient Diabetes Coordinator Team Pager [phone] (8a-5p)

## 2018-08-04 NOTE — Progress Notes (Addendum)
[facility]  Subjective  - Very frustrated about her health and the big changes with her needing so many procedures.   Objective (!) 149/77 87 98.8 F (37.1 C) (Oral) (!) 22 99%  Intake/Output Summary (Last 24 hours) at 08/04/2018 1114 Last data filed at 08/03/2018 1810 Gross per 24 hour  Intake 402 ml  Output -  Net 402 ml    B UE with palpable radial pulses Right IJ temp catheter in place Right foot/TMA with infected wound leukocytosis improved WBC now 11.2  Assessment/Planning: Vein mapping shows no reasonable vein for fistula creation. Plan for left AV graft placement and exchange of temp cath for [facility] Wed. 09/2018 with Dr. Donnetta Hutching  I called the HD coordinator in the dialysis lab and asked if they could do HD in the afternoon after surgery. 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 DPM  Roxy Horseman 08/04/2018 11:14 AM --  Laboratory Lab Results: Recent Labs    08/03/18 0440 08/04/18 0416  WBC 11.5* 11.2*  HGB 8.6* 8.8*  HCT 26.5* 27.0*  PLT 384 445*   BMET Recent Labs    08/03/18 0440 08/04/18 0416  NA 138 135  K 3.9 4.1  CL 100 94*  CO2 25 25  GLUCOSE 355* 302*  BUN 22* 33*  CREATININE 3.22* 4.25*  CALCIUM 6.5* 6.3*    COAG Lab Results  Component Value Date   INR 1.4 (H) 07/30/2018   INR 1.65 (H) 09/01/2014   INR 1.07 03/25/2013   No results found for: PTT  I have examined the patient, reviewed and agree with above.  White blood cell count has improved.  Difficult problem overall.  Patient is very upset about multiple issues including renal failure.  I discussed the very temporary nature of her right IJ catheter and the importance of converting this to a tunneled catheter now that it is safe.  Also recommend long-term arm access.  Her vein map shows extremely small cephalic and basilic veins bilaterally making it very  unlikely she would be a fistula candidate.  She is right-hand dominant and prefers left arm access.  I have recommended left arm access.  Splane that we would look at this with ultrasound at surgery but in all likelihood will require prosthetic Gore-Tex graft.  We have scheduled this for Wednesday she agrees currently.  Curt Jews, MD 08/04/2018 12:31 PM

## 2018-08-04 NOTE — Interval H&P Note (Signed)
History and Physical Interval Note:  08/04/2018 5:19 PM  [name]  has presented today for surgery, with the diagnosis of infected right foot.  The various methods of treatment have been discussed with the patient and family. After consideration of risks, benefits and other options for treatment, the patient has consented to  Procedure(s): IRRIGATION AND DEBRIDEMENT FOOT (Right) as a surgical intervention.  The patient's history has been reviewed, patient examined, no change in status, stable for surgery.  I have reviewed the patient's chart and labs.  Questions were answered to the patient's satisfaction.     Edrick Kins

## 2018-08-04 NOTE — Progress Notes (Signed)
Patient did not receive dinner since she was in hemodialysis and then sent for an I&D.   Patient given 4 units of humalog tonight per NP.

## 2018-08-04 NOTE — Progress Notes (Signed)
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 S/p incision and drainage with multiple areas of infection with specimens obtained positive for Staphylococcus aureus and blood cultures with MRSA bacteremia.  Diabetic foot infection s/p incision and drainage - Planned back to the OR for repeat I/D today. Refusing recommended BKA. Wound is unlikely to heal with antibiotics and poorly controlled blood sugars with ultimate result likely to be BKA. Continue wound care per [name].   MRSA bacteremia - Discussed that she is receiving her vancomycin in combination with dialysis. Repeat blood cultures from 5/1 remain without growth to date. TTE results are pending. Agree with line holiday following HD to ensure clearance as she may have had bacteremia when line was inserted. Most likely source of bacteremia appears to be her foot/ankle. She will need a prolonged course of antibiotics likely with HD pending outcomes. Continue vancomycin.   Type 1 diabetes complicated with nephropathy and neuropathy - Remains poorly controlled with less than optimal adherence and cooperation from [name]. Discussed importance of improving blood sugars to help with wound healing. Continued management per primary team.   New ESRD - Started on dialysis with plans for establishing for [facility] and AV access pending improvement/treatment of MSRSA infection. Appreciate nephrology's assistance with line holiday following dialysis today.   Therapeutic Drug Monitoring - Continue to monitor vancomycin peak/trough per pharmacy protocols.    Principal Problem:   Osteomyelitis of right foot ([place]) Active  Problems:   Type 1 diabetes mellitus with neurological manifestations, uncontrolled ([place])   DM type 1 causing renal disease ([place])   . amLODipine  5 mg Oral QHS  . calcitRIOL  0.5 mcg Oral Daily  . calcium acetate  1,334 mg Oral TID WC  . darbepoetin (ARANESP) injection - NON-DIALYSIS  200 mcg Subcutaneous Q Thu-1800  . heparin  5,000 Units Subcutaneous Q8H  . insulin aspart  0-5 Units Subcutaneous TID WC  . insulin aspart  0-5 Units Subcutaneous QHS  . insulin aspart  2 Units Subcutaneous TID WC  . insulin glargine  12 Units Subcutaneous QHS  . loratadine  10 mg Oral QHS  . oxyCODONE  10 mg Oral Once  . pravastatin  40 mg Oral Daily  . sodium chloride flush  10-40 mL Intracatheter Q12H  . sodium chloride flush  3 mL Intravenous Q12H  . sodium chloride flush  3 mL Intravenous Q12H  . vancomycin variable dose per unstable renal function (pharmacist dosing)   Does not apply See admin instructions    SUBJECTIVE:   Afebrile overnight with no acute events. Blood glucose remains elevated with most recent of 302. Hemodialysis completed yesterday with plan for line holiday following dialysis today.   [name] is frustrated and expresses concern regarding all of the recent changes in the last week having gone from walking around the grocery store to now requiring dialysis, recommended BKA, and being blind. She is concerned that she is not getting her antibiotics as she needs. Feeling "crappy" today.   Allergies  Allergen Reactions  . Cleocin [Clindamycin Hcl] Diarrhea  . Lisinopril Other (See Comments)    Elevated potassium per pt report  .  Amoxicillin Diarrhea, Nausea Only and Other (See Comments)    Has patient had a PCN reaction causing immediate rash, facial/tongue/throat swelling, SOB or lightheadedness with hypotension: No Has patient had a PCN reaction causing severe rash involving mucus membranes or skin necrosis: No Has patient had a PCN reaction that required  hospitalization: No Has patient had a PCN reaction occurring within the last 10 years: No If all of the above answers are "NO", then may proceed with Cephalosporin use.   . Bactrim [Sulfamethoxazole-Trimethoprim] Diarrhea and Nausea Only     Review of Systems: Review of Systems  Constitutional: Negative for chills, fever and weight loss.  Respiratory: Negative for cough, shortness of breath and wheezing.   Cardiovascular: Negative for chest pain and leg swelling.  Gastrointestinal: Negative for abdominal pain, constipation, diarrhea, nausea and vomiting.  Skin: Negative for rash.      OBJECTIVE: Vitals:   08/03/18 2001 08/03/18 2359 08/04/18 0359 08/04/18 0806  BP:    (!) 149/77  Pulse:    87  Resp:    (!) 22  Temp: 98.8 F (37.1 C) 99.1 F (37.3 C) 98.7 F (37.1 C) 98.8 F (37.1 C)  TempSrc: Oral Oral Oral Oral  SpO2:    99%  Weight:      Height:       Body mass index is 20.01 kg/m.  Physical Exam Constitutional:      General: She is not in acute distress.    Appearance: She is well-developed.     Comments: Lying in bed with head of bed elevated. Frustrated.   Cardiovascular:     Rate and Rhythm: Normal rate and regular rhythm.     Heart sounds: Murmur present.     Comments: Dialysis catheter in right neck appears with dressing that is clean and dry and without evidence of infection.  Pulmonary:     Effort: Pulmonary effort is normal.     Breath sounds: Normal breath sounds. No wheezing or rhonchi.  Chest:     Chest wall: No tenderness.  Musculoskeletal:     Comments: Dressing on right lower extremity clean and dry.  Skin:    General: Skin is warm and dry.  Neurological:     Mental Status: She is alert and oriented to person, place, and time.     Lab Results Lab Results  Component Value Date   WBC 11.2 (H) 08/04/2018   HGB 8.8 (L) 08/04/2018   HCT 27.0 (L) 08/04/2018   MCV 84.1 08/04/2018   PLT 445 (H) 08/04/2018    Lab Results  Component Value  Date   CREATININE 4.25 (H) 08/04/2018   BUN 33 (H) 08/04/2018   NA 135 08/04/2018   K 4.1 08/04/2018   CL 94 (L) 08/04/2018   CO2 25 08/04/2018    Lab Results  Component Value Date   ALT 19 07/31/2018   AST 27 07/31/2018   ALKPHOS 198 (H) 07/31/2018   BILITOT 1.8 (H) 07/31/2018     Microbiology: Recent Results (from the past 240 hour(s))  Blood culture (routine x 2)     Status: Abnormal (Preliminary result)   Collection Time: 07/29/18  3:24 PM  Result Value Ref Range Status   Specimen Description   Final    BLOOD BLOOD RIGHT FOREARM Performed at [facility], [place] [address] [place], [place] [address]    Special Requests   Final    BOTTLES DRAWN AEROBIC AND ANAEROBIC Blood Culture adequate volume Performed at [facility], [place] [address] [place], [place] [address]    Culture  Setup Time   Final    GRAM POSITIVE COCCI IN BOTH AEROBIC AND ANAEROBIC BOTTLES CRITICAL RESULT CALLED TO, READ BACK BY AND VERIFIED WITH: Alinda Dooms, AT 1017 08/01/18 BY Rush Landmark Performed at [facility], [place] [address], [place], [place] [address]    Culture METHICILLIN RESISTANT STAPHYLOCOCCUS AUREUS (A)  Final   Report Status PENDING  Incomplete   Organism ID, Bacteria METHICILLIN RESISTANT STAPHYLOCOCCUS AUREUS  Final      Susceptibility   Methicillin resistant staphylococcus aureus - MIC*    CIPROFLOXACIN >=8 RESISTANT Resistant     ERYTHROMYCIN >=8 RESISTANT Resistant     GENTAMICIN <=0.5 SENSITIVE Sensitive     OXACILLIN >=4 RESISTANT Resistant     TETRACYCLINE >=16 RESISTANT Resistant     VANCOMYCIN 1 SENSITIVE Sensitive     TRIMETH/SULFA <=10 SENSITIVE Sensitive     CLINDAMYCIN RESISTANT Resistant     RIFAMPIN <=0.5 SENSITIVE Sensitive     Inducible Clindamycin POSITIVE Resistant     * METHICILLIN RESISTANT STAPHYLOCOCCUS AUREUS  Blood Culture ID Panel (Reflexed)     Status: Abnormal   Collection Time: 07/29/18  3:24 PM  Result Value  Ref Range Status   Enterococcus species NOT DETECTED NOT DETECTED Final   Listeria monocytogenes NOT DETECTED NOT DETECTED Final   Staphylococcus species DETECTED (A) NOT DETECTED Final    Comment: CRITICAL RESULT CALLED TO, READ BACK BY AND VERIFIED WITH: Derry Lory PHARMD, AT 8527 08/01/18 BY D. VANHOOK    Staphylococcus aureus (BCID) DETECTED (A) NOT DETECTED Final    Comment: Methicillin (oxacillin)-resistant Staphylococcus aureus (MRSA). MRSA is predictably resistant to beta-lactam antibiotics (except ceftaroline). Preferred therapy is vancomycin unless clinically contraindicated. Patient requires contact precautions if  hospitalized. CRITICAL RESULT CALLED TO, READ BACK BY AND VERIFIED WITH: Derry Lory PHARMD, AT [phone] 08/01/18 BY D. VANHOOK    Methicillin resistance DETECTED (A) NOT DETECTED Final    Comment: CRITICAL RESULT CALLED TO, READ BACK BY AND VERIFIED WITH: Derry Lory PHARMD, AT [phone] 08/01/18 BY D. VANHOOK    Streptococcus species NOT DETECTED NOT DETECTED Final   Streptococcus agalactiae NOT DETECTED NOT DETECTED Final   Streptococcus pneumoniae NOT DETECTED NOT DETECTED Final   Streptococcus pyogenes NOT DETECTED NOT DETECTED Final   Acinetobacter baumannii NOT DETECTED NOT DETECTED Final   Enterobacteriaceae species NOT DETECTED NOT DETECTED Final   Enterobacter cloacae complex NOT DETECTED NOT DETECTED Final   Escherichia coli NOT DETECTED NOT DETECTED Final   Klebsiella oxytoca NOT DETECTED NOT DETECTED Final   Klebsiella pneumoniae NOT DETECTED NOT DETECTED Final   Proteus species NOT DETECTED NOT DETECTED Final   Serratia marcescens NOT DETECTED NOT DETECTED Final   Haemophilus influenzae NOT DETECTED NOT DETECTED Final   Neisseria meningitidis NOT DETECTED NOT DETECTED Final   Pseudomonas aeruginosa NOT DETECTED NOT DETECTED Final   Candida albicans NOT DETECTED NOT DETECTED Final   Candida glabrata NOT DETECTED NOT DETECTED Final   Candida krusei NOT DETECTED NOT  DETECTED Final   Candida parapsilosis NOT DETECTED NOT DETECTED Final   Candida tropicalis NOT DETECTED NOT DETECTED Final    Comment: Performed at [facility], [place]. [address], [place], [place] [address]  SARS Coronavirus 2 [facility] order, Performed in [facility] hospital lab)     Status: None   Collection Time: 07/29/18  3:57 PM  Result Value Ref Range Status   SARS  Coronavirus 2 NEGATIVE NEGATIVE Final    Comment: (NOTE) If result is NEGATIVE SARS-CoV-2 target nucleic acids are NOT DETECTED. The SARS-CoV-2 RNA is generally detectable in upper and lower  respiratory specimens during the acute phase of infection. The lowest  concentration of SARS-CoV-2 viral copies this assay can detect is 250  copies / mL. A negative result does not preclude SARS-CoV-2 infection  and should not be used as the sole basis for treatment or other  patient management decisions.  A negative result may occur with  improper specimen collection / handling, submission of specimen other  than nasopharyngeal swab, presence of viral mutation(s) within the  areas targeted by this assay, and inadequate number of viral copies  (<250 copies / mL). A negative result must be combined with clinical  observations, patient history, and epidemiological information. If result is POSITIVE SARS-CoV-2 target nucleic acids are DETECTED. The SARS-CoV-2 RNA is generally detectable in upper and lower  respiratory specimens dur ing the acute phase of infection.  Positive  results are indicative of active infection with SARS-CoV-2.  Clinical  correlation with patient history and other diagnostic information is  necessary to determine patient infection status.  Positive results do  not rule out bacterial infection or co-infection with other viruses. If result is PRESUMPTIVE POSTIVE SARS-CoV-2 nucleic acids MAY BE PRESENT.   A presumptive positive result was obtained on the submitted specimen  and confirmed on repeat  testing.  While 2019 novel coronavirus  (SARS-CoV-2) nucleic acids may be present in the submitted sample  additional confirmatory testing may be necessary for epidemiological  and / or clinical management purposes  to differentiate between  SARS-CoV-2 and other Sarbecovirus currently known to infect humans.  If clinically indicated additional testing with an alternate test  methodology [phone]) is advised. The SARS-CoV-2 RNA is generally  detectable in upper and lower respiratory sp ecimens during the acute  phase of infection. The expected result is Negative. Fact Sheet for Patients:  StrictlyIdeas.no Fact Sheet for Healthcare Providers: BankingDealers.co.za This test is not yet approved or cleared by the [place] FDA and has been authorized for detection and/or diagnosis of SARS-CoV-2 by FDA under an Emergency Use Authorization (EUA).  This EUA will remain in effect (meaning this test can be used) for the duration of the COVID-19 declaration under Section 564(b)(1) of the Act, 21 U.S.C. section 360bbb-3(b)(1), unless the authorization is terminated or revoked sooner. Performed at [facility], [place] [address], [place], [place] [address]   MRSA PCR Screening     Status: Abnormal   Collection Time: 07/30/18  8:28 AM  Result Value Ref Range Status   MRSA by PCR POSITIVE (A) NEGATIVE Final    Comment:        The GeneXpert MRSA Assay (FDA approved for NASAL specimens only), is one component of a comprehensive MRSA colonization surveillance program. It is not intended to diagnose MRSA infection nor to guide or monitor treatment for MRSA infections. RESULT CALLED TO, READ BACK BY AND VERIFIED WITH: A. Lovena Le RN 11:15 07/30/18 ([email or web address]) Performed at [facility], [place] [address] [place], [place] [address]   Respiratory Panel by PCR     Status: None   Collection Time: 07/30/18  8:29 AM  Result Value Ref  Range Status   Adenovirus NOT DETECTED NOT DETECTED Final   Coronavirus 229E NOT DETECTED NOT DETECTED Final    Comment: (NOTE) The Coronavirus on the Respiratory Panel, DOES NOT test for the novel  Coronavirus (2019 nCoV)    Coronavirus HKU1 NOT DETECTED NOT DETECTED Final   Coronavirus NL63 NOT DETECTED NOT DETECTED Final   Coronavirus OC43 NOT DETECTED NOT DETECTED Final   Metapneumovirus NOT DETECTED NOT DETECTED Final   Rhinovirus / Enterovirus NOT DETECTED NOT DETECTED Final   Influenza A NOT DETECTED NOT DETECTED Final   Influenza B NOT DETECTED NOT DETECTED Final   Parainfluenza Virus 1 NOT DETECTED NOT DETECTED Final   Parainfluenza Virus 2 NOT DETECTED NOT DETECTED Final   Parainfluenza Virus 3 NOT DETECTED NOT DETECTED Final   Parainfluenza Virus 4 NOT DETECTED NOT DETECTED Final   Respiratory Syncytial Virus NOT DETECTED NOT DETECTED Final   Bordetella pertussis NOT DETECTED NOT DETECTED Final   Chlamydophila pneumoniae NOT DETECTED NOT DETECTED Final   Mycoplasma pneumoniae NOT DETECTED NOT DETECTED Final    Comment: Performed at [facility], [place] [address] [place], [place] [address]  Aerobic/Anaerobic Culture (surgical/deep wound)     Status: None (Preliminary result)   Collection Time: 07/31/18  6:52 PM  Result Value Ref Range Status   Specimen Description ABSCESS RIGHT FOOT  Final   Special Requests NONE  Final   Gram Stain   Final    ABUNDANT WBC PRESENT,BOTH PMN AND MONONUCLEAR ABUNDANT GRAM POSITIVE COCCI Performed at [facility], [address], [place], [place] [address]    Culture   Final    MODERATE METHICILLIN RESISTANT STAPHYLOCOCCUS AUREUS NO ANAEROBES ISOLATED; CULTURE IN PROGRESS FOR 5 DAYS    Report Status PENDING  Incomplete   Organism ID, Bacteria METHICILLIN RESISTANT STAPHYLOCOCCUS AUREUS  Final      Susceptibility   Methicillin resistant staphylococcus aureus - MIC*    CIPROFLOXACIN >=8 RESISTANT Resistant     ERYTHROMYCIN >=8  RESISTANT Resistant     GENTAMICIN <=0.5 SENSITIVE Sensitive     OXACILLIN >=4 RESISTANT Resistant     TETRACYCLINE >=16 RESISTANT Resistant     VANCOMYCIN 1 SENSITIVE Sensitive     TRIMETH/SULFA <=10 SENSITIVE Sensitive     CLINDAMYCIN RESISTANT Resistant     RIFAMPIN <=0.5 SENSITIVE Sensitive     Inducible Clindamycin POSITIVE Resistant     * MODERATE METHICILLIN RESISTANT STAPHYLOCOCCUS AUREUS  Culture, blood (routine x 2)     Status: None (Preliminary result)   Collection Time: 08/01/18 10:50 AM  Result Value Ref Range Status   Specimen Description BLOOD LEFT HAND  Final   Special Requests   Final    BOTTLES DRAWN AEROBIC AND ANAEROBIC Blood Culture adequate volume   Culture   Final    NO GROWTH 3 DAYS Performed at [facility], [address], [place], [place] [address]    Report Status PENDING  Incomplete  Culture, blood (routine x 2)     Status: None (Preliminary result)   Collection Time: 08/01/18 11:00 AM  Result Value Ref Range Status   Specimen Description BLOOD RIGHT HAND  Final   Special Requests   Final    AEROBIC BOTTLE ONLY Blood Culture results may not be optimal due to an inadequate volume of blood received in culture bottles   Culture   Final    NO GROWTH 3 DAYS Performed at [facility], [place] [address] [place], [place] [address]    Report Status PENDING  Incomplete     Terri Piedra, [place] [facility] [phone] Pager  08/04/2018  9:59 AM

## 2018-08-05 ENCOUNTER — Encounter (HOSPITAL_COMMUNITY): Payer: Self-pay | Admitting: Podiatry

## 2018-08-05 DIAGNOSIS — E1065 Type 1 diabetes mellitus with hyperglycemia: Secondary | ICD-10-CM

## 2018-08-05 DIAGNOSIS — L97519 Non-pressure chronic ulcer of other part of right foot with unspecified severity: Secondary | ICD-10-CM

## 2018-08-05 DIAGNOSIS — E1049 Type 1 diabetes mellitus with other diabetic neurological complication: Secondary | ICD-10-CM

## 2018-08-05 DIAGNOSIS — Z978 Presence of other specified devices: Secondary | ICD-10-CM

## 2018-08-05 LAB — AEROBIC/ANAEROBIC CULTURE W GRAM STAIN (SURGICAL/DEEP WOUND)

## 2018-08-05 LAB — POCT I-STAT EG7
Acid-Base Excess: 7 mmol/L — ABNORMAL HIGH (ref 0.0–2.0)
Bicarbonate: 30.9 mmol/L — ABNORMAL HIGH (ref 20.0–28.0)
Calcium, Ion: 1.01 mmol/L — ABNORMAL LOW (ref 1.15–1.40)
HCT: 28 % — ABNORMAL LOW (ref 36.0–46.0)
Hemoglobin: 9.5 g/dL — ABNORMAL LOW (ref 12.0–15.0)
O2 Saturation: 54 %
Potassium: 3.4 mmol/L — ABNORMAL LOW (ref 3.5–5.1)
Sodium: 135 mmol/L (ref 135–145)
TCO2: 32 mmol/L (ref 22–32)
pCO2, Ven: 42.7 mmHg — ABNORMAL LOW (ref 44.0–60.0)
pH, Ven: 7.468 — ABNORMAL HIGH (ref 7.250–7.430)
pO2, Ven: 27 mmHg — CL (ref 32.0–45.0)

## 2018-08-05 LAB — RENAL FUNCTION PANEL
Albumin: 1.5 g/dL — ABNORMAL LOW (ref 3.5–5.0)
Anion gap: 17 — ABNORMAL HIGH (ref 5–15)
BUN: 19 mg/dL (ref 6–20)
CO2: 22 mmol/L (ref 22–32)
Calcium: 6.9 mg/dL — ABNORMAL LOW (ref 8.9–10.3)
Chloride: 95 mmol/L — ABNORMAL LOW (ref 98–111)
Creatinine, Ser: 3.1 mg/dL — ABNORMAL HIGH (ref 0.44–1.00)
GFR calc Af Amer: 20 mL/min — ABNORMAL LOW (ref >60)
GFR calc non Af Amer: 17 mL/min — ABNORMAL LOW (ref >60)
Glucose, Bld: 324 mg/dL — ABNORMAL HIGH (ref 70–99)
Phosphorus: 3.7 mg/dL (ref 2.5–4.6)
Potassium: 4.2 mmol/L (ref 3.5–5.1)
Sodium: 134 mmol/L — ABNORMAL LOW (ref 135–145)

## 2018-08-05 LAB — GLUCOSE, CAPILLARY
Glucose-Capillary: 373 mg/dL — ABNORMAL HIGH (ref 70–99)
Glucose-Capillary: 386 mg/dL — ABNORMAL HIGH (ref 70–99)
Glucose-Capillary: 214 mg/dL — ABNORMAL HIGH (ref 70–99)
Glucose-Capillary: 120 mg/dL — ABNORMAL HIGH (ref 70–99)

## 2018-08-05 LAB — AEROBIC/ANAEROBIC CULTURE (SURGICAL/DEEP WOUND)

## 2018-08-05 MED ORDER — NEPRO/CARBSTEADY PO LIQD
237.0000 mL | Freq: Three times a day (TID) | ORAL | Status: DC
  Administered 2018-08-05 – 2018-08-08 (×3): 237 mL via ORAL

## 2018-08-05 MED ORDER — INSULIN PUMP
Freq: Three times a day (TID) | SUBCUTANEOUS | Status: DC
  Administered 2018-08-05 (×2): via SUBCUTANEOUS
  Administered 2018-08-06: 1.1 via SUBCUTANEOUS
  Administered 2018-08-08 – 2018-08-09 (×3): via SUBCUTANEOUS
  Filled 2018-08-05: qty 1

## 2018-08-05 NOTE — Anesthesia Preprocedure Evaluation (Addendum)
Anesthesia Evaluation  Patient identified by MRN, date of birth, ID band Patient awake    Reviewed: Allergy & Precautions, H&P , NPO status , Patient's Chart, lab work & pertinent test results  History of Anesthesia Complications Negative for: history of anesthetic complications  Airway Mallampati: II  TM Distance: >3 FB Neck ROM: Full    Dental no notable dental hx. (+) Dental Advisory Given   Pulmonary sleep apnea , former smoker,    Pulmonary exam normal        Cardiovascular hypertension, Pt. on medications + Peripheral Vascular Disease  Normal cardiovascular exam     Neuro/Psych Anxiety negative neurological ROS     GI/Hepatic negative GI ROS, Neg liver ROS,   Endo/Other  diabetes, Type 1, Insulin Dependent  Renal/GU ESRFRenal disease  negative genitourinary   Musculoskeletal negative musculoskeletal ROS (+)   Abdominal   Peds negative pediatric ROS (+)  Hematology  (+) Blood dyscrasia, anemia ,   Anesthesia Other Findings   Reproductive/Obstetrics negative OB ROS                            Anesthesia Physical  Anesthesia Plan  ASA: III  Anesthesia Plan: General   Post-op Pain Management:    Induction: Intravenous  PONV Risk Score and Plan: 3 and Ondansetron, Dexamethasone and Diphenhydramine  Airway Management Planned: LMA  Additional Equipment:   Intra-op Plan:   Post-operative Plan: Extubation in OR  Informed Consent: I have reviewed the patients History and Physical, chart, labs and discussed the procedure including the risks, benefits and alternatives for the proposed anesthesia with the patient or authorized representative who has indicated his/her understanding and acceptance.     Dental advisory given  Plan Discussed with: CRNA, Anesthesiologist and Surgeon  Anesthesia Plan Comments:        Anesthesia Quick Evaluation

## 2018-08-05 NOTE — Telephone Encounter (Signed)
Please see if Vaughan Basta has some samples

## 2018-08-05 NOTE — Telephone Encounter (Signed)
Called pt again and informed her that Dr. Dwyane Dee would be willing to consult with the hospitalist. Pt is currently scheduled for an office visit tomorrow. Pt has not had insulin pump on in over 1 week due to being hospitalized.  Can you use inpatient bloodsugar readings and do telephone visit, or do you prefer to postpone?

## 2018-08-05 NOTE — Telephone Encounter (Signed)
Message left on machine that I have 3 cartridges and 10 infusion sets for her to use.

## 2018-08-05 NOTE — Telephone Encounter (Signed)
I was called by the hospital doctor yesterday evening and I recommended that she start back on the pump last night.  They can get the diabetes educator at the hospital to watch her. She will need to postpone her appointment here until she has been on the pump for couple weeks

## 2018-08-05 NOTE — Progress Notes (Signed)
Inpatient Diabetes Program Recommendations  AACE/ADA: New Consensus Statement on Inpatient Glycemic Control (2015)  Target Ranges:  Prepandial:   less than 140 mg/dL      Peak postprandial:   less than 180 mg/dL (1-2 hours)      Critically ill patients:  140 - 180 mg/dL   Results for [name], [name]" (MRN [id number]) as of 08/05/2018 09:16  Ref. Range 08/04/2018 08:01 08/04/2018 11:27 08/04/2018 18:58 08/04/2018 20:46 08/04/2018 22:10 08/05/2018 07:49  Glucose-Capillary Latest Ref Range: 70 - 99 mg/dL 456 (H) 449 (H) 173 (H) 212 (H) 279 (H) 373 (H)   Review of Glycemic Control  Spoke with patient over the phone. Noted she refused Lantus dose last night. Glucose trend back up this morning. Dr. Dwyane Dee recommending patient be placed back on her pump. Spoke with patient about restarting her insulin pump. Patient states "she can not see her insulin pump." however patient may go into DKA if she continues to refuse the basal insulin.   Patient could possibly restart pump to her basal settings and [place] use a correction scale mirroring her insulin pump settings to bolus her.  Staff could assist patient in placing her CBG in her pump and watching her bolus herself. Nursing staff are not familiar with insulin pumps. There maybe some Nursing staff that do not feel comfortable with this option.  Dr. Cathlean Sauer to discuss with patient today when he rounds.  Thanks,  Tama Headings RN, MSN, BC-ADM Inpatient Diabetes Coordinator Team Pager [phone] (8a-5p)

## 2018-08-05 NOTE — Progress Notes (Signed)
   Vein mapping shows no reasonable vein for fistula creation. Plan for left AV graft placement and exchange of temp cath for [facility] Wed. 09/2018 with Dr. Donnetta Hutching  NPO past [facility] notified of OR plan 08/06/2018  Roxy Horseman PA-C

## 2018-08-05 NOTE — Progress Notes (Signed)
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 S/p incision and drainage with multiple areas of infection with specimens obtained positive for Staphylococcus aureus and blood cultures with MRSA bacteremia.  Diabetic foot infection status post incision and drainage -postop day #1 from repeat I&D with wound VAC now in place.  Surgical specimens obtained from right foot with Gram stain showing gram-positive cocci and culture reintubated for better growth.  This is most likely be MRSA.  Continue current dose of vancomycin with dialysis.  MRSA bacteremia -repeat cultures from 5/1 remain without growth to date.  TTE results remain pending.  She does continue to have a temporary dialysis catheter located in her right neck.  Fortunately her repeat cultures from 5/1 remain without growth.  Plan for catheter exchange on 5/6.  Continue to monitor cultures.  Type 1 diabetes complicated with nephropathy and neuropathy -primary team working with outpatient endocrinologist and insulin pump restarted.  Continue management per primary team.  New end-stage renal disease -vein and vascular surgery and nephrology following with plans for continued outpatient dialysis.  She expresses great frustration with her change in nutritional intake to a renal diet with fluid restriction of 1200 cc.  Therapeutic drug monitoring -stable and no adverse side effects at present.  Continue management per pharmacy protocol.     Principal Problem:   Osteomyelitis of right foot ([place]) Active Problems:   Type 1 diabetes mellitus with neurological manifestations, uncontrolled ([place])   DM type 1 causing renal  disease ([place])   . amLODipine  5 mg Oral QHS  . calcitRIOL  0.5 mcg Oral Daily  . calcium acetate  1,334 mg Oral TID WC  . darbepoetin (ARANESP) injection - NON-DIALYSIS  200 mcg Subcutaneous Q Thu-1800  . heparin  5,000 Units Subcutaneous Q8H  . insulin aspart  0-5 Units Subcutaneous TID WC  . insulin aspart  0-5 Units Subcutaneous QHS  . insulin aspart  2 Units Subcutaneous TID WC  . insulin pump   Subcutaneous TID AC, HS, 0200  . loratadine  10 mg Oral QHS  . oxyCODONE  10 mg Oral Once  . pravastatin  40 mg Oral Daily  . sodium chloride flush  10-40 mL Intracatheter Q12H  . sodium chloride flush  3 mL Intravenous Q12H  . sodium chloride flush  3 mL Intravenous Q12H  . vancomycin variable dose per unstable renal function (pharmacist dosing)   Does not apply See admin instructions    SUBJECTIVE:  Afebrile overnight with no acute events.  Remains significantly frustrated with continued changes and plans for care.  Expresses frustration regarding change in nutritional intake to renal diet.  Continues to feel "crappy".  Allergies  Allergen Reactions  . Cleocin [Clindamycin Hcl] Diarrhea  . Lisinopril Other (See Comments)    Elevated potassium per pt report  . Amoxicillin Diarrhea, Nausea Only and Other (See Comments)    Has patient had a PCN reaction causing immediate rash, facial/tongue/throat swelling, SOB or lightheadedness with hypotension: No Has patient had a PCN reaction causing severe rash involving mucus membranes or skin necrosis: No Has patient had a PCN reaction that required hospitalization: No Has patient had a  PCN reaction occurring within the last 10 years: No If all of the above answers are "NO", then may proceed with Cephalosporin use.   . Bactrim [Sulfamethoxazole-Trimethoprim] Diarrhea and Nausea Only     Review of Systems: Review of Systems  Constitutional: Positive for malaise/fatigue. Negative for chills, fever and weight loss.  Respiratory: Negative  for cough, shortness of breath and wheezing.   Cardiovascular: Negative for chest pain.  Gastrointestinal: Negative for abdominal pain, constipation, diarrhea, nausea and vomiting.  Skin: Negative for rash.     OBJECTIVE: Vitals:   08/04/18 2328 08/05/18 0200 08/05/18 0600 08/05/18 1000  BP: (!) 163/83 134/70 130/74 (!) 154/80  Pulse:  83 83 89  Resp:  15 16 18   Temp:  98.2 F (36.8 C) 98.6 F (37 C)   TempSrc:  Oral Oral   SpO2:  94% 95% 97%  Weight:      Height:       Body mass index is 19.37 kg/m.  Physical Exam Constitutional:      General: She is not in acute distress.    Appearance: She is well-developed.     Comments: Lying in bed with head of bed elevated; frustrated/agitated  Cardiovascular:     Rate and Rhythm: Normal rate and regular rhythm.     Heart sounds: Normal heart sounds.     Comments: Temporary dialysis catheter located in right neck. Pulmonary:     Effort: Pulmonary effort is normal.     Breath sounds: Normal breath sounds.  Musculoskeletal:     Comments: Surgical dressing on right lower extremity appears clean and dry with wound VAC present.  Current suction is at appropriate level with primarily sanguinous drainage in the canister.  Skin:    General: Skin is warm and dry.  Neurological:     Mental Status: She is alert.     Lab Results Lab Results  Component Value Date   WBC 11.2 (H) 08/04/2018   HGB 9.5 (L) 08/04/2018   HCT 28.0 (L) 08/04/2018   MCV 84.1 08/04/2018   PLT 445 (H) 08/04/2018    Lab Results  Component Value Date   CREATININE 3.10 (H) 08/05/2018   BUN 19 08/05/2018   NA 134 (L) 08/05/2018   K 4.2 08/05/2018   CL 95 (L) 08/05/2018   CO2 22 08/05/2018    Lab Results  Component Value Date   ALT 19 07/31/2018   AST 27 07/31/2018   ALKPHOS 198 (H) 07/31/2018   BILITOT 1.8 (H) 07/31/2018     Microbiology: Recent Results (from the past 240 hour(s))  Blood culture (routine x 2)     Status: Abnormal   Collection  Time: 07/29/18  3:24 PM  Result Value Ref Range Status   Specimen Description   Final    BLOOD BLOOD RIGHT FOREARM Performed at [facility], [place] [address] [place], [place] [address]    Special Requests   Final    BOTTLES DRAWN AEROBIC AND ANAEROBIC Blood Culture adequate volume Performed at [place] [address], [place], [place] [address]    Culture  Setup Time   Final    GRAM POSITIVE COCCI IN BOTH AEROBIC AND ANAEROBIC BOTTLES CRITICAL RESULT CALLED TO, READ BACK BY AND VERIFIED WITH: Alinda Dooms, AT 0349 08/01/18 BY Rush Landmark Performed at [facility], [place] [address] [place], [place] [address]    Culture METHICILLIN RESISTANT STAPHYLOCOCCUS AUREUS (A)  Final   Report Status 08/04/2018 FINAL  Final   Organism ID, Bacteria METHICILLIN RESISTANT STAPHYLOCOCCUS AUREUS  Final      Susceptibility   Methicillin resistant staphylococcus aureus - MIC*    CIPROFLOXACIN >=8 RESISTANT Resistant     ERYTHROMYCIN >=8 RESISTANT Resistant     GENTAMICIN <=0.5 SENSITIVE Sensitive     OXACILLIN >=4 RESISTANT Resistant     TETRACYCLINE >=16 RESISTANT Resistant     VANCOMYCIN 1 SENSITIVE Sensitive     TRIMETH/SULFA <=10 SENSITIVE Sensitive     CLINDAMYCIN RESISTANT Resistant     RIFAMPIN <=0.5 SENSITIVE Sensitive     Inducible Clindamycin POSITIVE Resistant     * METHICILLIN RESISTANT STAPHYLOCOCCUS AUREUS  Blood Culture ID Panel (Reflexed)     Status: Abnormal   Collection Time: 07/29/18  3:24 PM  Result Value Ref Range Status   Enterococcus species NOT DETECTED NOT DETECTED Final   Listeria monocytogenes NOT DETECTED NOT DETECTED Final   Staphylococcus species DETECTED (A) NOT DETECTED Final    Comment: CRITICAL RESULT CALLED TO, READ BACK BY AND VERIFIED WITH: Derry Lory PHARMD, AT [phone] 08/01/18 BY D. VANHOOK    Staphylococcus aureus (BCID) DETECTED (A) NOT DETECTED Final    Comment: Methicillin (oxacillin)-resistant Staphylococcus  aureus (MRSA). MRSA is predictably resistant to beta-lactam antibiotics (except ceftaroline). Preferred therapy is vancomycin unless clinically contraindicated. Patient requires contact precautions if  hospitalized. CRITICAL RESULT CALLED TO, READ BACK BY AND VERIFIED WITH: Derry Lory PHARMD, AT [phone] 08/01/18 BY D. VANHOOK    Methicillin resistance DETECTED (A) NOT DETECTED Final    Comment: CRITICAL RESULT CALLED TO, READ BACK BY AND VERIFIED WITH: Derry Lory PHARMD, AT [phone] 08/01/18 BY D. VANHOOK    Streptococcus species NOT DETECTED NOT DETECTED Final   Streptococcus agalactiae NOT DETECTED NOT DETECTED Final   Streptococcus pneumoniae NOT DETECTED NOT DETECTED Final   Streptococcus pyogenes NOT DETECTED NOT DETECTED Final   Acinetobacter baumannii NOT DETECTED NOT DETECTED Final   Enterobacteriaceae species NOT DETECTED NOT DETECTED Final   Enterobacter cloacae complex NOT DETECTED NOT DETECTED Final   Escherichia coli NOT DETECTED NOT DETECTED Final   Klebsiella oxytoca NOT DETECTED NOT DETECTED Final   Klebsiella pneumoniae NOT DETECTED NOT DETECTED Final   Proteus species NOT DETECTED NOT DETECTED Final   Serratia marcescens NOT DETECTED NOT DETECTED Final   Haemophilus influenzae NOT DETECTED NOT DETECTED Final   Neisseria meningitidis NOT DETECTED NOT DETECTED Final   Pseudomonas aeruginosa NOT DETECTED NOT DETECTED Final   Candida albicans NOT DETECTED NOT DETECTED Final   Candida glabrata NOT DETECTED NOT DETECTED Final   Candida krusei NOT DETECTED NOT DETECTED Final   Candida parapsilosis NOT DETECTED NOT DETECTED Final   Candida tropicalis NOT DETECTED NOT DETECTED Final    Comment: Performed at [facility], [place]. [address] [place], [place] [address]  SARS Coronavirus 2 [facility] order, Performed in [facility] hospital lab)     Status: None   Collection Time: 07/29/18  3:57 PM  Result Value Ref Range Status   SARS Coronavirus 2 NEGATIVE NEGATIVE Final    Comment:  (NOTE) If result is NEGATIVE SARS-CoV-2 target nucleic acids are NOT DETECTED. The SARS-CoV-2 RNA is generally detectable in upper and lower  respiratory specimens during the acute phase of infection. The lowest  concentration of SARS-CoV-2 viral copies this assay can detect is 250  copies / mL. A negative result does not preclude SARS-CoV-2 infection  and should not be used as the sole basis for treatment or other  patient management decisions.  A negative result may occur with  improper specimen collection / handling, submission of specimen other  than nasopharyngeal swab, presence of viral mutation(s) within the  areas targeted by this assay, and inadequate number of viral copies  (<250 copies / mL). A negative result must be combined with clinical  observations, patient history, and epidemiological information. If result is POSITIVE SARS-CoV-2 target nucleic acids are DETECTED. The SARS-CoV-2 RNA is generally detectable in upper and lower  respiratory specimens dur ing the acute phase of infection.  Positive  results are indicative of active infection with SARS-CoV-2.  Clinical  correlation with patient history and other diagnostic information is  necessary to determine patient infection status.  Positive results do  not rule out bacterial infection or co-infection with other viruses. If result is PRESUMPTIVE POSTIVE SARS-CoV-2 nucleic acids MAY BE PRESENT.   A presumptive positive result was obtained on the submitted specimen  and confirmed on repeat testing.  While 2019 novel coronavirus  (SARS-CoV-2) nucleic acids may be present in the submitted sample  additional confirmatory testing may be necessary for epidemiological  and / or clinical management purposes  to differentiate between  SARS-CoV-2 and other Sarbecovirus currently known to infect humans.  If clinically indicated additional testing with an alternate test  methodology [phone]) is advised. The SARS-CoV-2 RNA is  generally  detectable in upper and lower respiratory sp ecimens during the acute  phase of infection. The expected result is Negative. Fact Sheet for Patients:  StrictlyIdeas.no Fact Sheet for Healthcare Providers: BankingDealers.co.za This test is not yet approved or cleared by the [place] FDA and has been authorized for detection and/or diagnosis of SARS-CoV-2 by FDA under an Emergency Use Authorization (EUA).  This EUA will remain in effect (meaning this test can be used) for the duration of the COVID-19 declaration under Section 564(b)(1) of the Act, 21 U.S.C. section 360bbb-3(b)(1), unless the authorization is terminated or revoked sooner. Performed at [facility], [place] [address] [place], [place] [address]   MRSA PCR Screening     Status: Abnormal   Collection Time: 07/30/18  8:28 AM  Result Value Ref Range Status   MRSA by PCR POSITIVE (A) NEGATIVE Final    Comment:        The GeneXpert MRSA Assay (FDA approved for NASAL specimens only), is one component of a comprehensive MRSA colonization surveillance program. It is not intended to diagnose MRSA infection nor to guide or monitor treatment for MRSA infections. RESULT CALLED TO, READ BACK BY AND VERIFIED WITH: A. Lovena Le RN 11:15 07/30/18 ([email or web address]) Performed at [facility], [place] [address] [place], [place] [address]   Respiratory Panel by PCR     Status: None   Collection Time: 07/30/18  8:29 AM  Result Value Ref Range Status   Adenovirus NOT DETECTED NOT DETECTED Final   Coronavirus 229E NOT DETECTED NOT DETECTED Final    Comment: (NOTE) The Coronavirus on the Respiratory Panel, DOES NOT test for the novel  Coronavirus (2019 nCoV)    Coronavirus HKU1 NOT DETECTED NOT DETECTED Final   Coronavirus NL63 NOT DETECTED NOT DETECTED Final   Coronavirus OC43 NOT DETECTED NOT DETECTED Final   Metapneumovirus NOT DETECTED NOT DETECTED Final    Rhinovirus / Enterovirus NOT DETECTED NOT DETECTED Final   Influenza A NOT DETECTED NOT DETECTED Final   Influenza B NOT DETECTED NOT DETECTED Final   Parainfluenza Virus 1 NOT DETECTED NOT DETECTED Final   Parainfluenza  Virus 2 NOT DETECTED NOT DETECTED Final   Parainfluenza Virus 3 NOT DETECTED NOT DETECTED Final   Parainfluenza Virus 4 NOT DETECTED NOT DETECTED Final   Respiratory Syncytial Virus NOT DETECTED NOT DETECTED Final   Bordetella pertussis NOT DETECTED NOT DETECTED Final   Chlamydophila pneumoniae NOT DETECTED NOT DETECTED Final   Mycoplasma pneumoniae NOT DETECTED NOT DETECTED Final    Comment: Performed at [facility], [place] [address], [place], [place] [address]  Fungus Culture With Stain     Status: None (Preliminary result)   Collection Time: 07/31/18  6:52 PM  Result Value Ref Range Status   Fungus Stain Final report  Final    Comment: (NOTE) Performed At: [facility] [place], [place] [id number] Rush Farmer MD NO:[id number]    Fungus (Mycology) Culture PENDING  Incomplete   Fungal Source ABSCESS  Final    Comment: RIGHT FOOT Performed at [facility], [place] [address], [place], [place] [address]   Aerobic/Anaerobic Culture (surgical/deep wound)     Status: None (Preliminary result)   Collection Time: 07/31/18  6:52 PM  Result Value Ref Range Status   Specimen Description ABSCESS RIGHT FOOT  Final   Special Requests NONE  Final   Gram Stain   Final    ABUNDANT WBC PRESENT,BOTH PMN AND MONONUCLEAR ABUNDANT GRAM POSITIVE COCCI Performed at [facility], [address] [place], [place] [address]    Culture   Final    MODERATE METHICILLIN RESISTANT STAPHYLOCOCCUS AUREUS NO ANAEROBES ISOLATED; CULTURE IN PROGRESS FOR 5 DAYS    Report Status PENDING  Incomplete   Organism ID, Bacteria METHICILLIN RESISTANT STAPHYLOCOCCUS AUREUS  Final      Susceptibility   Methicillin resistant staphylococcus aureus - MIC*     CIPROFLOXACIN >=8 RESISTANT Resistant     ERYTHROMYCIN >=8 RESISTANT Resistant     GENTAMICIN <=0.5 SENSITIVE Sensitive     OXACILLIN >=4 RESISTANT Resistant     TETRACYCLINE >=16 RESISTANT Resistant     VANCOMYCIN 1 SENSITIVE Sensitive     TRIMETH/SULFA <=10 SENSITIVE Sensitive     CLINDAMYCIN RESISTANT Resistant     RIFAMPIN <=0.5 SENSITIVE Sensitive     Inducible Clindamycin POSITIVE Resistant     * MODERATE METHICILLIN RESISTANT STAPHYLOCOCCUS AUREUS  Fungus Culture Result     Status: None   Collection Time: 07/31/18  6:52 PM  Result Value Ref Range Status   Result 1 Comment  Final    Comment: (NOTE) KOH/Calcofluor preparation:  no fungus observed. Performed At: [facility] [place], [place] [id number] Rush Farmer MD WS:[id number]   Culture, blood (routine x 2)     Status: None (Preliminary result)   Collection Time: 08/01/18 10:50 AM  Result Value Ref Range Status   Specimen Description BLOOD LEFT HAND  Final   Special Requests   Final    BOTTLES DRAWN AEROBIC AND ANAEROBIC Blood Culture adequate volume   Culture   Final    NO GROWTH 4 DAYS Performed at [facility], [address] [place], [place] [address]    Report Status PENDING  Incomplete  Culture, blood (routine x 2)     Status: None (Preliminary result)   Collection Time: 08/01/18 11:00 AM  Result Value Ref Range Status   Specimen Description BLOOD RIGHT HAND  Final   Special Requests   Final    AEROBIC BOTTLE ONLY Blood Culture results may not be optimal due to an  inadequate volume of blood received in culture bottles   Culture   Final    NO GROWTH 4 DAYS Performed at [facility], [place] [address], [place], [place] [address]    Report Status PENDING  Incomplete  Aerobic/Anaerobic Culture (surgical/deep wound)     Status: None (Preliminary result)   Collection Time: 08/04/18  8:19 PM  Result Value Ref Range Status   Specimen Description WOUND RIGHT FOOT  Final   Special  Requests NONE  Final   Gram Stain   Final    RARE WBC PRESENT, PREDOMINANTLY MONONUCLEAR FEW GRAM POSITIVE COCCI Performed at [facility], [address] [place],  [address]    Culture PENDING  Incomplete   Report Status PENDING  Incomplete     Terri Piedra, NP [facility] [phone] Pager  08/05/2018  10:22 AM

## 2018-08-05 NOTE — Progress Notes (Signed)
[place] [name] PROGRESS NOTE  Assessment/ Plan: Pt is a 48 y.o. 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 S/p HD yesterday, tolerated well.  -Evaluated by [facility], plan for AV graft and TDC placement tomorrow, need to do dialysis after the surgery on Wednesday. I will order HD in am.  #Anemia of chronic disease: Continue ESA.  No iron because of sepsis.  #Right foot abscess status post I&D on 4/30 and on 08/04/2018. On IV vancomycin per ID.    #Secondary hyperparathyroidism: Monitor electrolytes, albumin level is low.  Continue calcitriol, Tums and calcium acetate.    #Hypertension: On amlodipine.  Blood pressure acceptable  Subjective: Seen and examined at bedside.  Denies chest pain, shortness of breath, nausea vomiting. Objective Vital signs in last 24 hours: Vitals:   08/04/18 2328 08/05/18 0200 08/05/18 0600 08/05/18 1000  BP: (!) 163/83 134/70 130/74 (!) 154/80  Pulse:  83 83 89  Resp:  15 16 18   Temp:  98.2 F (36.8 C) 98.6 F (37 C)   TempSrc:  Oral Oral   SpO2:  94% 95% 97%  Weight:      Height:       Weight change:   Intake/Output Summary (Last 24 hours) at 08/05/2018 1126 Last data filed at 08/05/2018 0814 Gross per 24 hour  Intake 423 ml  Output 2117 ml  Net -1694 ml       Labs: Basic Metabolic Panel: Recent Labs  Lab 08/03/18 0440 08/04/18 0416 08/04/18 1826 08/05/18 0412  NA 138 135 135 134*  K 3.9 4.1 3.4* 4.2  CL 100 94*  --  95*  CO2 25 25  --  22  GLUCOSE 355* 302*  --  324*  BUN 22* 33*  --  19  CREATININE 3.22* 4.25*  --  3.10*  CALCIUM 6.5* 6.3*  --  6.9*  PHOS 4.4 4.0  --  3.7   Liver Function Tests: Recent Labs  Lab 07/29/18 1802 07/31/18 0300  08/03/18 0440 08/04/18 0416 08/05/18 0412  AST 16 27  --   --   --   --   ALT 18  19  --   --   --   --   ALKPHOS 249* 198*  --   --   --   --   BILITOT 0.4 1.8*  --   --   --   --   PROT 6.3* 5.4*  --   --   --   --   ALBUMIN 2.1* 1.8*   < > 1.4* 1.5* 1.5*   < > = values in this interval not displayed.   No results for input(s): LIPASE, AMYLASE in the last 168 hours. No results for input(s): AMMONIA in the last 168 hours. CBC: Recent Labs  Lab 07/29/18 1557 07/30/18 0104  07/31/18 0300  08/01/18 0249 08/03/18 0440 08/04/18 0416 08/04/18 1826  WBC 24.7* 27.0*  --  21.5*  --  20.3* 11.5* 11.2*  --   NEUTROABS 22.8*  --   --   --   --   --   --  7.8*  --   HGB 8.5* 8.2*  --  6.8*   < > 8.6* 8.6* 8.8* 9.5*  HCT 26.8* 25.3*   < > 20.3*   < > 25.2* 26.5* 27.0* 28.0*  MCV  88.2 86.3  --  83.2  --  83.2 85.8 84.1  --   PLT 528* 597*  --  576*  --  516* 384 445*  --    < > = values in this interval not displayed.   Cardiac Enzymes: No results for input(s): CKTOTAL, CKMB, CKMBINDEX, TROPONINI in the last 168 hours. CBG: Recent Labs  Lab 08/04/18 1127 08/04/18 1858 08/04/18 2046 08/04/18 2210 08/05/18 0749  GLUCAP 449* 173* 212* 279* 373*    Iron Studies: No results for input(s): IRON, TIBC, TRANSFERRIN, FERRITIN in the last 72 hours. Studies/Results: No results found.  Medications: Infusions: . sodium chloride      Scheduled Medications: . amLODipine  5 mg Oral QHS  . calcitRIOL  0.5 mcg Oral Daily  . calcium acetate  1,334 mg Oral TID WC  . darbepoetin (ARANESP) injection - NON-DIALYSIS  200 mcg Subcutaneous Q Thu-1800  . heparin  5,000 Units Subcutaneous Q8H  . insulin aspart  0-5 Units Subcutaneous QHS  . insulin pump   Subcutaneous TID AC, HS, 0200  . loratadine  10 mg Oral QHS  . oxyCODONE  10 mg Oral Once  . pravastatin  40 mg Oral Daily  . sodium chloride flush  10-40 mL Intracatheter Q12H  . sodium chloride flush  3 mL Intravenous Q12H  . sodium chloride flush  3 mL Intravenous Q12H  . vancomycin variable dose per unstable renal  function (pharmacist dosing)   Does not apply See admin instructions    have reviewed scheduled and prn medications.  Physical Exam: General:NAD, comfortable Heart:RRR, s1s2 nl no rubs Lungs: Clear bilateral, no crackles or wheezing Abdomen:soft, Non-tender, non-distended Extremities:No edema  Dron Tanna Furry 08/05/2018,11:26 AM  LOS: 7 days

## 2018-08-05 NOTE — Progress Notes (Addendum)
Initial Nutrition Assessment   RD working remotely.  DOCUMENTATION CODES:   Not applicable  INTERVENTION:  Provide Nepro Shake po TID, each supplement provides 425 kcal and 19 grams protein.  Encourage adequate PO intake.   Diet education regarding renal diet attached to discharge instructions.  NUTRITION DIAGNOSIS:   Increased nutrient needs related to wound healing, chronic illness(CKD5 on HD) as evidenced by estimated needs.  GOAL:   Patient will meet greater than or equal to 90% of their needs  MONITOR:   PO intake, Supplement acceptance, Labs, Weight trends, I & O's, Skin  REASON FOR ASSESSMENT:   (Wound healing)    ASSESSMENT:   48 year old female who presents with right foot edema and pain. Pt with past medical history of poorly controlled type 1 diabetes mellitus, diabetic foot, status post multiple amputations, hypertension and dyslipidemia. Pt with sepsis due to right foot tenosynovitis and osteomyelitis. MRSA bacteremia. 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   4/30- I&D R extremity 5/1- placement non-tunneled dialysis catheter 5/4- I&D R foot with placement of wound VAC  Per MD, plan for AV graft and TDC placement tomorrow. RD contacted pt via inpatient room phone. Pt reports n/v and dislike of current renal diet. Meal completion has been varied from 15-100%. Pt does report eating well PTA with no other difficulties. No significant weight loss per weight records. Pt requesting renal diet education information. RD to attach diet handout "Chronic Kidney Disease Stage 5 Nutrition Therapy" from the Academy of [employer] and Dietetics Manual. RD to order nutritional supplements to aid in caloric and protein needs.   Unable to complete Nutrition-Focused physical exam at this time.   Labs and medications reviewed. CBG 173-449 mg/dL.  Insulin pump TID with meals.   Diet Order:   Diet Order            Diet NPO time specified Except for: Sips with Meds  Diet  effective midnight        Diet renal with fluid restriction Fluid restriction: 1200 mL Fluid; Room service appropriate? Yes; Fluid consistency: Thin  Diet effective now              EDUCATION NEEDS:   Education needs have been addressed  Skin:  Skin Assessment: Skin Integrity Issues: Skin Integrity Issues:: Incisions, Wound VAC Wound Vac: R foot Incisions: R foot, leg  Last BM:  5/4  Height:   Ht Readings from Last 1 Encounters:  07/29/18 5\' 8"  (1.727 m)    Weight:   Wt Readings from Last 1 Encounters:  08/04/18 57.8 kg    Ideal Body Weight:  63.6 kg  BMI:  Body mass index is 19.37 kg/m.  Estimated Nutritional Needs:   Kcal:  1850-2050  Protein:  90-105 grams  Fluid:  1.2 L/day    Corrin Parker, MS, RD, LDN Pager # [phone] After hours/ weekend pager # [phone]

## 2018-08-05 NOTE — Progress Notes (Signed)
PROGRESS NOTE    [name]  OMV:[id number] DOB: July 23, 1970 DOA: 07/29/2018 PCP: [name]    Brief Narrative:  48 year old female who presents with right foot edema and pain. She does have significant past medical history of poorly controlled type 1 diabetes mellitus, diabetic foot, status post multiple amputations, hypertension and dyslipidemia. Presented with 1 week history of worsening right lower extremity pain,erythema and edema, refractive to oral biotic therapy with doxycycline. Positive fevers and chills. On her initial physical examination her temperature was 100.1, heart rate 123, blood pressure 148/76, respiratory rate 20, oxygen saturation 100. She had moist mucous membranes, her lungs are clear to auscultation bilaterally, heart S1-S2 present tachycardic, the abdomen was soft nontender, her right lower extremity was edematous, positive erythema. Sodium 133, potassium 4.5, chloride 113, bicarb less than 7, glucose 61, BUN 101, creatinine 7.0, anion gap 14, white count 24.7, hemoglobin 8.5, hematocrit 26.8, platelets 528.MRI of the right ankle showed tenosynovitis of the anterior tibialis tendon and extensor hallucis longus tendon plus extending into the first tarsal joint. Osteomyelitis involving the medial cuneiform and bases of the first and second metatarsals.  Patient was admitted to the hospital with working diagnosis of sepsis due to right foot tenosynovitis/osteomyelitis. Complicated by acute kidney injury chronic kidney disease.  Patient underwent incision and drainage of right foot abscess on April 30. Her blood cultures grew MRSA. She had progressive renal failure, underwent hemodialysis catheter placement and proceeded with renal replacement therapy.  Repeat I&D on 08/04/18 per Dr. Amalia [name].   Patient will have tunneled HD catheter placed on 08/06/18, continue vancomycin IV per HD. I contacted Dr. Baird [name] ophthalmology who will see the patient  once she gets discharge.  Currently using her insulin pump for glucose control, needs help due to decreased vision.    Assessment & Plan:   Principal Problem:   Osteomyelitis of right foot ([place]) Active Problems:   Type 1 diabetes mellitus with neurological manifestations, uncontrolled ([place])   DM type 1 causing renal disease ([place])  1. Sepsis due to right foot tenosynovitis and osteomyelitis.MRSA bacteremia. Follow up blood cultures from 05.01 with no growth/ patient has declined BKA. IV vancomycin per pharmacy protocol on HD. Tolerating well. Patient had second I&D yesterday, continue local wound care. Pain is well controlled with fentanyl and oxycodone. Patient will be discharge to SNF when stable. Will need total of 6 weeks of IV vancomycin.   2. 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 for tunneled HD catheter in am, then continue hemodialysis, M-W-F schedule, will need outpatient HD unit.  Continue with Phoslo, calcitriol, and darbepoetin, for metabolic bone disease.  3. T1DM. Patient at home on insulin pump. her insulin pump is now in place, she will need help from nursing for bolus insulin, she is tolerating po well, no nausea or vomiting. I spoke with her ophthalmologist Dr [name], and he will see the patient at discharge, last eye procedure per his report was in 2019. Per Mrs., [name] request I did contacted ophthalmology on call, with recommendations that any procedure will have to be as outpatient. For now will continue close monitor, continue to work on hyperglycemia.      4. HTN. Continue with amlodipine.     DVT prophylaxis:heparin Code Status:full Family Communication:no family at the bedside Disposition Plan/ discharge barriers:pending outpatient HD unit and further wound care.   Body mass index is 19.37 kg/m. Malnutrition Type:  Nutrition Problem: Increased nutrient needs Etiology: wound healing, chronic illness(CKD5 on HD)  Malnutrition Characteristics:   Signs/Symptoms: estimated needs   Nutrition Interventions:  Interventions: Nepro shake  RN Pressure Injury Documentation:     Consultants:   Nephrology   ID  Podiatry   Procedures:   Right foot I&D x2. Wound vac  Antimicrobials:   IV vancomycin     Subjective: Patient with no nausea or vomiting, no chest pain or foot pain, continue complain of decreased vision, no nausea or vomiting.   Objective: Vitals:   08/05/18 0200 08/05/18 0600 08/05/18 1000 08/05/18 1319  BP: 134/70 130/74 (!) 154/80 (!) 161/83  Pulse: 83 83 89 84  Resp: 15 16 18 18   Temp: 98.2 F (36.8 C) 98.6 F (37 C)  97.7 F (36.5 C)  TempSrc: Oral Oral  Oral  SpO2: 94% 95% 97% 97%  Weight:      Height:        Intake/Output Summary (Last 24 hours) at 08/05/2018 1647 Last data filed at 08/05/2018 1300 Gross per 24 hour  Intake 723 ml  Output 2117 ml  Net -1394 ml   Filed Weights   08/02/18 1542 08/04/18 1515 08/04/18 1821  Weight: 59.7 kg 60.1 kg 57.8 kg    Examination:   General: Not in pain or dyspnea. Deconditioned  Neurology: Awake and alert, non focal  E ENT: mild pallor, no icterus, oral mucosa moist Cardiovascular: No JVD. S1-S2 present, rhythmic, no gallops, rubs, or murmurs. Trace lower extremity edema. Pulmonary: positive breath sounds bilaterally, adequate air movement, no wheezing, rhonchi or rales. Gastrointestinal. Abdomen with no organomegaly, non tender, no rebound or guarding Skin. Right foot wound with wound vac in place.  Musculoskeletal: no joint deformities     Data Reviewed: I have personally reviewed following labs and imaging studies  CBC: Recent Labs  Lab 07/30/18 0104  07/31/18 0300 07/31/18 1629 08/01/18 0249 08/03/18 0440 08/04/18 0416 08/04/18 1826  WBC 27.0*  --  21.5*  --  20.3* 11.5* 11.2*  --   NEUTROABS  --   --   --   --   --   --  7.8*  --   HGB 8.2*  --  6.8* 8.2* 8.6* 8.6* 8.8* 9.5*  HCT 25.3*   < > 20.3* 24.0* 25.2* 26.5* 27.0* 28.0*   MCV 86.3  --  83.2  --  83.2 85.8 84.1  --   PLT 597*  --  576*  --  516* 384 445*  --    < > = values in this interval not displayed.   Basic Metabolic Panel: Recent Labs  Lab 07/30/18 0104  08/01/18 0249 08/02/18 0400 08/03/18 0440 08/04/18 0416 08/04/18 1826 08/05/18 0412  NA 135   < > 135 138 138 135 135 134*  K 4.5   < > 4.2 3.1* 3.9 4.1 3.4* 4.2  CL 109   < > 103 100 100 94*  --  95*  CO2 7*   < > 12* 26 25 25   --  22  GLUCOSE 147*   < > 156* 171* 355* 302*  --  324*  BUN 102*   < > 108* 44* 22* 33*  --  19  CREATININE 7.23*   < > 7.84* 4.31* 3.22* 4.25*  --  3.10*  CALCIUM 6.8*   < > 5.5* 7.0* 6.5* 6.3*  --  6.9*  MG 1.3*  --   --   --   --   --   --   --   PHOS 5.9*  --  7.1* 4.1 4.4 4.0  --  3.7   < > = values in this interval not displayed.   GFR: Estimated Creatinine Clearance: 20.7 mL/min (A) (by C-G formula based on SCr of 3.1 mg/dL (H)). Liver Function Tests: Recent Labs  Lab 07/29/18 1802 07/31/18 0300 08/01/18 0249 08/02/18 0400 08/03/18 0440 08/04/18 0416 08/05/18 0412  AST 16 27  --   --   --   --   --   ALT 18 19  --   --   --   --   --   ALKPHOS 249* 198*  --   --   --   --   --   BILITOT 0.4 1.8*  --   --   --   --   --   PROT 6.3* 5.4*  --   --   --   --   --   ALBUMIN 2.1* 1.8* 1.8* 1.5* 1.4* 1.5* 1.5*   No results for input(s): LIPASE, AMYLASE in the last 168 hours. No results for input(s): AMMONIA in the last 168 hours. Coagulation Profile: Recent Labs  Lab 07/30/18 0104  INR 1.4*   Cardiac Enzymes: No results for input(s): CKTOTAL, CKMB, CKMBINDEX, TROPONINI in the last 168 hours. BNP (last 3 results) No results for input(s): PROBNP in the last 8760 hours. HbA1C: No results for input(s): HGBA1C in the last 72 hours. CBG: Recent Labs  Lab 08/04/18 1858 08/04/18 2046 08/04/18 2210 08/05/18 0749 08/05/18 1154  GLUCAP 173* 212* 279* 373* 386*   Lipid Profile: No results for input(s): CHOL, HDL, LDLCALC, TRIG, CHOLHDL,  LDLDIRECT in the last 72 hours. Thyroid Function Tests: No results for input(s): TSH, T4TOTAL, FREET4, T3FREE, THYROIDAB in the last 72 hours. Anemia Panel: No results for input(s): VITAMINB12, FOLATE, FERRITIN, TIBC, IRON, RETICCTPCT in the last 72 hours.    Radiology Studies: I have reviewed all of the imaging during this hospital visit personally     Scheduled Meds: . amLODipine  5 mg Oral QHS  . calcitRIOL  0.5 mcg Oral Daily  . calcium acetate  1,334 mg Oral TID WC  . darbepoetin (ARANESP) injection - NON-DIALYSIS  200 mcg Subcutaneous Q Thu-1800  . feeding supplement (NEPRO CARB STEADY)  237 mL Oral TID WC  . heparin  5,000 Units Subcutaneous Q8H  . insulin pump   Subcutaneous TID AC, HS, 0200  . loratadine  10 mg Oral QHS  . oxyCODONE  10 mg Oral Once  . pravastatin  40 mg Oral Daily  . sodium chloride flush  10-40 mL Intracatheter Q12H  . sodium chloride flush  3 mL Intravenous Q12H  . sodium chloride flush  3 mL Intravenous Q12H  . vancomycin variable dose per unstable renal function (pharmacist dosing)   Does not apply See admin instructions   Continuous Infusions: . sodium chloride       LOS: 7 days        Fredie Majano Gerome Apley, MD

## 2018-08-05 NOTE — Telephone Encounter (Signed)
Do you have any samples so this patient can get back on her pump and return in a couple of weeks for her office visit?

## 2018-08-05 NOTE — Telephone Encounter (Signed)
Pt needs an appt sooner than two weeks because she needs proper documentation for medicare in order to receive pump supplies. At this point, she has no pump supplies.

## 2018-08-05 NOTE — Progress Notes (Signed)
Patient refused IV insertion. Says she has access in her neck and does not need a peripheral. MD notified. Also attempted to get consent form signed for procedure in the morning. Patient said she would sign it tomorrow morning, refused to sign now. Has appeared very agitated throughout the shift, emotional support provided. Will continue to monitor.

## 2018-08-06 ENCOUNTER — Encounter (HOSPITAL_COMMUNITY)
Admission: EM | Disposition: A | Discharge status: Home Health | Payer: Self-pay | Source: Home / Self Care | Attending: Internal Medicine

## 2018-08-06 ENCOUNTER — Ambulatory Visit: Payer: Medicare Other | Admitting: Endocrinology

## 2018-08-06 ENCOUNTER — Other Ambulatory Visit (HOSPITAL_COMMUNITY): Payer: Self-pay | Admitting: *Deleted

## 2018-08-06 ENCOUNTER — Inpatient Hospital Stay (HOSPITAL_COMMUNITY): Payer: Medicare Other

## 2018-08-06 ENCOUNTER — Inpatient Hospital Stay (HOSPITAL_COMMUNITY): Payer: Medicare Other | Admitting: Anesthesiology

## 2018-08-06 ENCOUNTER — Encounter (HOSPITAL_COMMUNITY): Payer: Self-pay | Admitting: Certified Registered Nurse Anesthetist

## 2018-08-06 DIAGNOSIS — A4102 Sepsis due to Methicillin resistant Staphylococcus aureus: Principal | ICD-10-CM

## 2018-08-06 DIAGNOSIS — N185 Chronic kidney disease, stage 5: Secondary | ICD-10-CM

## 2018-08-06 DIAGNOSIS — N186 End stage renal disease: Secondary | ICD-10-CM

## 2018-08-06 HISTORY — PX: INSERTION OF DIALYSIS CATHETER: SHX1324

## 2018-08-06 HISTORY — PX: AV FISTULA PLACEMENT: SHX1204

## 2018-08-06 LAB — RENAL FUNCTION PANEL
Albumin: 1.5 g/dL — ABNORMAL LOW (ref 3.5–5.0)
Anion gap: 15 (ref 5–15)
BUN: 22 mg/dL — ABNORMAL HIGH (ref 6–20)
CO2: 26 mmol/L (ref 22–32)
Calcium: 6.8 mg/dL — ABNORMAL LOW (ref 8.9–10.3)
Chloride: 94 mmol/L — ABNORMAL LOW (ref 98–111)
Creatinine, Ser: 3.87 mg/dL — ABNORMAL HIGH (ref 0.44–1.00)
GFR calc Af Amer: 15 mL/min — ABNORMAL LOW (ref >60)
GFR calc non Af Amer: 13 mL/min — ABNORMAL LOW (ref >60)
Glucose, Bld: 88 mg/dL (ref 70–99)
Phosphorus: 4.2 mg/dL (ref 2.5–4.6)
Potassium: 4 mmol/L (ref 3.5–5.1)
Sodium: 135 mmol/L (ref 135–145)

## 2018-08-06 LAB — CBC
HCT: 26.5 % — ABNORMAL LOW (ref 36.0–46.0)
Hemoglobin: 8.3 g/dL — ABNORMAL LOW (ref 12.0–15.0)
MCH: 27.5 pg (ref 26.0–34.0)
MCHC: 31.3 g/dL (ref 30.0–36.0)
MCV: 87.7 fL (ref 80.0–100.0)
Platelets: 314 10*3/uL (ref 150–400)
RBC: 3.02 MIL/uL — ABNORMAL LOW (ref 3.87–5.11)
RDW: 13.6 % (ref 11.5–15.5)
WBC: 13.9 10*3/uL — ABNORMAL HIGH (ref 4.0–10.5)
nRBC: 0 % (ref 0.0–0.2)

## 2018-08-06 LAB — CULTURE, BLOOD (ROUTINE X 2)
Culture: NO GROWTH
Culture: NO GROWTH
Special Requests: ADEQUATE

## 2018-08-06 LAB — ECHOCARDIOGRAM LIMITED
Height: 68 in
Weight: 2105.83 oz

## 2018-08-06 LAB — GLUCOSE, CAPILLARY
Glucose-Capillary: 86 mg/dL (ref 70–99)
Glucose-Capillary: 78 mg/dL (ref 70–99)
Glucose-Capillary: 88 mg/dL (ref 70–99)
Glucose-Capillary: 160 mg/dL — ABNORMAL HIGH (ref 70–99)
Glucose-Capillary: 260 mg/dL — ABNORMAL HIGH (ref 70–99)
Glucose-Capillary: 86 mg/dL (ref 70–99)
Glucose-Capillary: 68 mg/dL — ABNORMAL LOW (ref 70–99)
Glucose-Capillary: 80 mg/dL (ref 70–99)
Glucose-Capillary: 172 mg/dL — ABNORMAL HIGH (ref 70–99)

## 2018-08-06 SURGERY — INSERTION OF DIALYSIS CATHETER
Anesthesia: General | Site: Chest | Laterality: Right

## 2018-08-06 MED ORDER — FENTANYL CITRATE (PF) 250 MCG/5ML IJ SOLN
INTRAMUSCULAR | Status: DC | PRN
  Administered 2018-08-06 (×2): 50 ug via INTRAVENOUS
  Administered 2018-08-06 (×2): 25 ug via INTRAVENOUS

## 2018-08-06 MED ORDER — HEPARIN SODIUM (PORCINE) 1000 UNIT/ML DIALYSIS
1000.0000 [IU] | INTRAMUSCULAR | Status: DC | PRN
  Filled 2018-08-06: qty 1

## 2018-08-06 MED ORDER — HEPARIN SODIUM (PORCINE) 1000 UNIT/ML DIALYSIS
20.0000 [IU]/kg | INTRAMUSCULAR | Status: DC | PRN
  Filled 2018-08-06: qty 2

## 2018-08-06 MED ORDER — HEPARIN SODIUM (PORCINE) 1000 UNIT/ML IJ SOLN
INTRAMUSCULAR | Status: AC
  Filled 2018-08-06: qty 4

## 2018-08-06 MED ORDER — FENTANYL CITRATE (PF) 250 MCG/5ML IJ SOLN
INTRAMUSCULAR | Status: AC
  Filled 2018-08-06: qty 5

## 2018-08-06 MED ORDER — SODIUM CHLORIDE 0.9 % IV SOLN: 100.0000 mL | INTRAVENOUS | Status: DC | PRN

## 2018-08-06 MED ORDER — SUCCINYLCHOLINE CHLORIDE 200 MG/10ML IV SOSY
PREFILLED_SYRINGE | INTRAVENOUS | Status: DC | PRN
  Administered 2018-08-06: 120 mg via INTRAVENOUS

## 2018-08-06 MED ORDER — CHLORHEXIDINE GLUCONATE CLOTH 2 % EX PADS
6.0000 | MEDICATED_PAD | Freq: Every day | CUTANEOUS | Status: DC
  Administered 2018-08-06: 6 via TOPICAL

## 2018-08-06 MED ORDER — HEPARIN SODIUM (PORCINE) 1000 UNIT/ML DIALYSIS: 1000.0000 [IU] | INTRAMUSCULAR | Status: DC | PRN

## 2018-08-06 MED ORDER — PENTAFLUOROPROP-TETRAFLUOROETH EX AERO: 1.0000 "application " | INHALATION_SPRAY | CUTANEOUS | Status: DC | PRN

## 2018-08-06 MED ORDER — PROMETHAZINE HCL 25 MG/ML IJ SOLN: 6.2500 mg | INTRAMUSCULAR | Status: DC | PRN

## 2018-08-06 MED ORDER — KETAMINE HCL 10 MG/ML IJ SOLN
INTRAMUSCULAR | Status: DC | PRN
  Administered 2018-08-06: 10 mg via INTRAVENOUS
  Administered 2018-08-06: 25 mg via INTRAVENOUS

## 2018-08-06 MED ORDER — DEXTROSE 50 % IV SOLN
INTRAVENOUS | Status: AC
  Administered 2018-08-06: 50 mL
  Filled 2018-08-06: qty 50

## 2018-08-06 MED ORDER — PROPOFOL 10 MG/ML IV BOLUS
INTRAVENOUS | Status: DC | PRN
  Administered 2018-08-06: 150 mg via INTRAVENOUS

## 2018-08-06 MED ORDER — VANCOMYCIN HCL IN DEXTROSE 500-5 MG/100ML-% IV SOLN
INTRAVENOUS | Status: AC
  Administered 2018-08-06: 500 mg via INTRAVENOUS
  Filled 2018-08-06: qty 100

## 2018-08-06 MED ORDER — SODIUM CHLORIDE 0.9 % IV SOLN
INTRAVENOUS | Status: DC | PRN
  Administered 2018-08-06: 09:00:00 via INTRAVENOUS

## 2018-08-06 MED ORDER — FENTANYL CITRATE (PF) 100 MCG/2ML IJ SOLN: 25.0000 ug | INTRAMUSCULAR | Status: DC | PRN

## 2018-08-06 MED ORDER — LIDOCAINE-PRILOCAINE 2.5-2.5 % EX CREA: 1.0000 "application " | TOPICAL_CREAM | CUTANEOUS | Status: DC | PRN

## 2018-08-06 MED ORDER — VANCOMYCIN HCL IN DEXTROSE 1-5 GM/200ML-% IV SOLN
INTRAVENOUS | Status: AC
  Filled 2018-08-06: qty 200

## 2018-08-06 MED ORDER — ACETAMINOPHEN 500 MG PO TABS: 1000.0000 mg | ORAL_TABLET | Freq: Once | ORAL | Status: DC

## 2018-08-06 MED ORDER — SODIUM CHLORIDE 0.9 % IV SOLN
INTRAVENOUS | Status: DC | PRN
  Administered 2018-08-06: 25 ug/min via INTRAVENOUS

## 2018-08-06 MED ORDER — LIDOCAINE HCL (PF) 1 % IJ SOLN: 5.0000 mL | INTRAMUSCULAR | Status: DC | PRN

## 2018-08-06 MED ORDER — SODIUM CHLORIDE 0.9 % IV SOLN
INTRAVENOUS | Status: AC
  Filled 2018-08-06: qty 1.2

## 2018-08-06 MED ORDER — HEPARIN SODIUM (PORCINE) 1000 UNIT/ML IJ SOLN
INTRAMUSCULAR | Status: AC
  Filled 2018-08-06: qty 1

## 2018-08-06 MED ORDER — VANCOMYCIN HCL IN DEXTROSE 500-5 MG/100ML-% IV SOLN
500.0000 mg | Freq: Once | INTRAVENOUS | Status: AC
  Administered 2018-08-06: 17:00:00 500 mg via INTRAVENOUS

## 2018-08-06 MED ORDER — ONDANSETRON HCL 4 MG/2ML IJ SOLN
INTRAMUSCULAR | Status: AC
  Filled 2018-08-06: qty 2

## 2018-08-06 MED ORDER — SODIUM CHLORIDE 0.9 % IV SOLN
INTRAVENOUS | Status: DC | PRN
  Administered 2018-08-06: 500 mL

## 2018-08-06 MED ORDER — HEPARIN SODIUM (PORCINE) 1000 UNIT/ML IJ SOLN
INTRAMUSCULAR | Status: DC | PRN
  Administered 2018-08-06: 3400 [IU]

## 2018-08-06 MED ORDER — ONDANSETRON HCL 4 MG/2ML IJ SOLN
INTRAMUSCULAR | Status: DC | PRN
  Administered 2018-08-06: 4 mg via INTRAVENOUS

## 2018-08-06 MED ORDER — OXYCODONE HCL 5 MG PO TABS
ORAL_TABLET | ORAL | Status: AC
  Filled 2018-08-06: qty 2

## 2018-08-06 MED ORDER — HYDROMORPHONE HCL 1 MG/ML IJ SOLN: 0.2500 mg | INTRAMUSCULAR | Status: DC | PRN

## 2018-08-06 MED ORDER — POVIDONE-IODINE 7.5 % EX SOLN: Freq: Once | CUTANEOUS | Status: DC

## 2018-08-06 MED ORDER — ALTEPLASE 2 MG IJ SOLR
2.0000 mg | Freq: Once | INTRAMUSCULAR | Status: DC | PRN
  Filled 2018-08-06: qty 2

## 2018-08-06 MED ORDER — ALTEPLASE 2 MG IJ SOLR: 2.0000 mg | Freq: Once | INTRAMUSCULAR | Status: DC | PRN

## 2018-08-06 MED ORDER — LIDOCAINE-PRILOCAINE 2.5-2.5 % EX CREA
1.0000 "application " | TOPICAL_CREAM | CUTANEOUS | Status: DC | PRN
  Filled 2018-08-06: qty 5

## 2018-08-06 MED ORDER — 0.9 % SODIUM CHLORIDE (POUR BTL) OPTIME
TOPICAL | Status: DC | PRN
  Administered 2018-08-06: 1000 mL

## 2018-08-06 MED ORDER — PHENYLEPHRINE 40 MCG/ML (10ML) SYRINGE FOR IV PUSH (FOR BLOOD PRESSURE SUPPORT)
PREFILLED_SYRINGE | INTRAVENOUS | Status: DC | PRN
  Administered 2018-08-06: 120 ug via INTRAVENOUS

## 2018-08-06 MED ORDER — SUCCINYLCHOLINE CHLORIDE 200 MG/10ML IV SOSY
PREFILLED_SYRINGE | INTRAVENOUS | Status: AC
  Filled 2018-08-06: qty 10

## 2018-08-06 MED ORDER — VANCOMYCIN HCL IN DEXTROSE 500-5 MG/100ML-% IV SOLN: 500.0000 mg | INTRAVENOUS | Status: DC

## 2018-08-06 MED ORDER — MIDAZOLAM HCL 2 MG/2ML IJ SOLN
INTRAMUSCULAR | Status: DC | PRN
  Administered 2018-08-06: 2 mg via INTRAVENOUS

## 2018-08-06 MED ORDER — MIDAZOLAM HCL 2 MG/2ML IJ SOLN
INTRAMUSCULAR | Status: AC
  Filled 2018-08-06: qty 2

## 2018-08-06 MED ORDER — PHENYLEPHRINE 40 MCG/ML (10ML) SYRINGE FOR IV PUSH (FOR BLOOD PRESSURE SUPPORT)
PREFILLED_SYRINGE | INTRAVENOUS | Status: AC
  Filled 2018-08-06: qty 10

## 2018-08-06 MED ORDER — VANCOMYCIN HCL 1000 MG IV SOLR
INTRAVENOUS | Status: DC | PRN
  Administered 2018-08-06: 1000 mg via INTRAVENOUS

## 2018-08-06 MED ORDER — KETAMINE HCL 50 MG/5ML IJ SOSY
PREFILLED_SYRINGE | INTRAMUSCULAR | Status: AC
  Filled 2018-08-06: qty 5

## 2018-08-06 MED ORDER — OXYCODONE HCL 5 MG PO TABS
5.0000 mg | ORAL_TABLET | Freq: Four times a day (QID) | ORAL | Status: DC | PRN
  Administered 2018-08-06 – 2018-08-07 (×2): 5 mg via ORAL
  Filled 2018-08-06 (×2): qty 1

## 2018-08-06 MED ORDER — LIDOCAINE HCL (PF) 1 % IJ SOLN
5.0000 mL | INTRAMUSCULAR | Status: DC | PRN
  Filled 2018-08-06: qty 5

## 2018-08-06 SURGICAL SUPPLY — 62 items
ADH SKN CLS APL DERMABOND .7 (GAUZE/BANDAGES/DRESSINGS) ×4
ARMBAND PINK RESTRICT EXTREMIT (MISCELLANEOUS) ×6 IMPLANT
BAG BANDED W/RUBBER/TAPE 36X54 (MISCELLANEOUS) ×1 IMPLANT
BAG DECANTER FOR FLEXI CONT (MISCELLANEOUS) ×3 IMPLANT
BAG EQP BAND 135X91 W/RBR TAPE (MISCELLANEOUS) ×2
BIOPATCH RED 1 DISK 7.0 (GAUZE/BANDAGES/DRESSINGS) ×3 IMPLANT
CANISTER SUCT 3000ML PPV (MISCELLANEOUS) ×3 IMPLANT
CANNULA VESSEL 3MM 2 BLNT TIP (CANNULA) ×3 IMPLANT
CATH PALINDROME RT-P 15FX19CM (CATHETERS) IMPLANT
CATH PALINDROME RT-P 15FX23CM (CATHETERS) ×1 IMPLANT
CATH PALINDROME RT-P 15FX28CM (CATHETERS) IMPLANT
CATH PALINDROME RT-P 15FX55CM (CATHETERS) IMPLANT
CLIP LIGATING EXTRA MED SLVR (CLIP) ×3 IMPLANT
CLIP LIGATING EXTRA SM BLUE (MISCELLANEOUS) ×3 IMPLANT
COVER DOME SNAP 22 D (MISCELLANEOUS) ×1 IMPLANT
COVER PROBE W GEL 5X96 (DRAPES) ×3 IMPLANT
COVER SURGICAL LIGHT HANDLE (MISCELLANEOUS) ×3 IMPLANT
COVER WAND RF STERILE (DRAPES) ×3 IMPLANT
DECANTER SPIKE VIAL GLASS SM (MISCELLANEOUS) ×3 IMPLANT
DERMABOND ADVANCED (GAUZE/BANDAGES/DRESSINGS) ×2
DERMABOND ADVANCED .7 DNX12 (GAUZE/BANDAGES/DRESSINGS) ×2 IMPLANT
DRAPE C-ARM 42X72 X-RAY (DRAPES) ×3 IMPLANT
DRAPE CHEST BREAST 15X10 FENES (DRAPES) ×3 IMPLANT
ELECT REM PT RETURN 9FT ADLT (ELECTROSURGICAL) ×3
ELECTRODE REM PT RTRN 9FT ADLT (ELECTROSURGICAL) ×2 IMPLANT
GAUZE 4X4 16PLY RFD (DISPOSABLE) ×4 IMPLANT
GAUZE SPONGE 4X4 12PLY STRL (GAUZE/BANDAGES/DRESSINGS) ×3 IMPLANT
GAUZE SPONGE 4X4 12PLY STRL LF (GAUZE/BANDAGES/DRESSINGS) ×1 IMPLANT
GLOVE BIOGEL PI IND STRL 7.5 (GLOVE) IMPLANT
GLOVE BIOGEL PI INDICATOR 7.5 (GLOVE) ×2
GLOVE SS BIOGEL STRL SZ 7.5 (GLOVE) ×2 IMPLANT
GLOVE SUPERSENSE BIOGEL SZ 7.5 (GLOVE) ×1
GOWN STRL REUS W/ TWL LRG LVL3 (GOWN DISPOSABLE) ×6 IMPLANT
GOWN STRL REUS W/TWL LRG LVL3 (GOWN DISPOSABLE) ×9
KIT BASIN OR (CUSTOM PROCEDURE TRAY) ×3 IMPLANT
KIT TURNOVER KIT B (KITS) ×3 IMPLANT
NDL 18GX1X1/2 (RX/OR ONLY) (NEEDLE) ×2 IMPLANT
NDL HYPO 25GX1X1/2 BEV (NEEDLE) ×2 IMPLANT
NEEDLE 18GX1X1/2 (RX/OR ONLY) (NEEDLE) ×3 IMPLANT
NEEDLE 22X1 1/2 (OR ONLY) (NEEDLE) IMPLANT
NEEDLE HYPO 25GX1X1/2 BEV (NEEDLE) ×3 IMPLANT
NS IRRIG 1000ML POUR BTL (IV SOLUTION) ×3 IMPLANT
PACK CV ACCESS (CUSTOM PROCEDURE TRAY) ×3 IMPLANT
PACK SURGICAL SETUP 50X90 (CUSTOM PROCEDURE TRAY) ×3 IMPLANT
PAD ARMBOARD 7.5X6 YLW CONV (MISCELLANEOUS) ×6 IMPLANT
SOAP 2 % CHG 4 OZ (WOUND CARE) ×3 IMPLANT
SUT ETHILON 3 0 PS 1 (SUTURE) ×3 IMPLANT
SUT PROLENE 6 0 CC (SUTURE) ×6 IMPLANT
SUT SILK 2 0 PERMA HAND 18 BK (SUTURE) IMPLANT
SUT VIC AB 3-0 SH 27 (SUTURE) ×6
SUT VIC AB 3-0 SH 27X BRD (SUTURE) ×4 IMPLANT
SUT VICRYL 4-0 PS2 18IN ABS (SUTURE) ×3 IMPLANT
SYR 10ML LL (SYRINGE) ×3 IMPLANT
SYR 20CC LL (SYRINGE) ×3 IMPLANT
SYR 5ML LL (SYRINGE) ×6 IMPLANT
SYR CONTROL 10ML LL (SYRINGE) ×3 IMPLANT
SYR TOOMEY 50ML (SYRINGE) IMPLANT
TAPE CLOTH SURG 4X10 WHT LF (GAUZE/BANDAGES/DRESSINGS) ×1 IMPLANT
TOWEL GREEN STERILE (TOWEL DISPOSABLE) ×6 IMPLANT
TOWEL GREEN STERILE FF (TOWEL DISPOSABLE) ×3 IMPLANT
UNDERPAD 30X30 (UNDERPADS AND DIAPERS) ×3 IMPLANT
WATER STERILE IRR 1000ML POUR (IV SOLUTION) ×3 IMPLANT

## 2018-08-06 NOTE — Progress Notes (Signed)
Patient is still refusing to sign consent form this morning for graft placement. Patient says she will sign the consent form once she gets to the OR. Patient has had CHG baths and has been NPO since midnight.

## 2018-08-06 NOTE — Procedures (Signed)
Patient was seen on dialysis and the procedure was supervised.  BFR 400  Via RIJ BP is 141/76 . S/p RIJ TDC and left AVF today by Dr. Donnetta Hutching. Patient appears to be tolerating treatment well. Outpatient HD already arranged. Patient is ok to discharge from renal perspective.  Jonathan Kirkendoll Tanna Furry 08/06/2018

## 2018-08-06 NOTE — Progress Notes (Signed)
Pt's insulin pump has the following basal rate settings:  0000-1100 0.4 units/hour 1130-1500 = 0.8 units/hour 1500- 2200 = 0.6 units/hour 2200- 0000 = 0.75 units/hour

## 2018-08-06 NOTE — Progress Notes (Signed)
PROGRESS NOTE    [name]  EUM:[id number] DOB: [birth year] DOA: 07/29/2018 PCP: Loyola Mast, PA-C      Brief Narrative:  [name] is a 48 y.o. F with T1DM, previous partial foot amputations, and HTN who presented with right foot swelling from wound clinic, found in the ER to have tachycardia, leukocytosis 20K, and MRI with tenosynovitis extending into the right tarsal joint as well as osteomyelitis of the medial cuneiform and bases of 1st and 2nd metatarsals as well as acute renal failure with Cr 7.0 mg/dL and BUN 101.      Assessment & Plan:  Acute renal failure S/p brachiobasilic left fistula Persistent oliguric renal failure.   -Monitor I/Os -Daily FRP -Consult Nephrology, appreciate cares -Calcitriol, PhosLo, Aranesp per nephrology   Sepsis from right foot diabetic foot infection, MRSA bacteremia Presented with tachycardia, leukocytosis and organ failure.  Source right foot.  MRSA bacteremia Likely source right foot. Initial cultures 4/28 -- 1/1 +MRSA Repeat cultures 5/1 -- NGTD x2 No vascular catheter or implanted devices present (HD cath placed after 5/1) Echo pending No back or neck pain, no other metastatic sources suspected at this time.  -Consult ID -Continue vancomycin, dosed per pharmacy [facility] regarding missing Echo report  Right foot osteomyelitis of the medial cuneiform, first and second metatarsals Tenosynovitis -[facility] -Maintain Wound vac  Type 1 diabetes Glucoses very labile, poorly controlled. -CBG TID AC and HS -Continue insulin via home insulin pump -Continue pravastatin -Hold aspirin for now, resume at d/c  Hypertension Blood pressure well controlled -Continue amlodipine -Hold Lasix  Other medications -Continue antihistamine -Hold oral hormone   SARS-CoV-2 testing was obtained for screening purposes.  COVID ruled out.      MDM and disposition: The below labs and imaging reports were  reviewed and summarized above.  Medication management as above.  The patient was admitted with acute renal failure and sepsis from [place].  She remains in oliguric renal failure.  This is an acute illness that poses threat to bodily function and life.    She had a fistula placed today.  We will work with nephrology to arrange outpatient dialysis, so that she may begin to progress towards discharge.     DVT prophylaxis: Heparin Code Status: FULL Family Communication: sister by phone    Consultants:   ID Nephrology  Podiatry  Vascular surgery  Procedures:   Doppler US right LE  Vein mapping LUE  Echocardiogram  Fistula placement, left, brachiobasilic     Antimicrobials:   Cefepime x1  Vancomycin 4/28 >>  Meropenem 4/29 >> 5/2     Subjective: Still confused postoperatively.  No new leg swelling.  No respiratory distress.  No vomiting, diarrhea.  Objective: Vitals:   08/06/18 1105 08/06/18 1110 08/06/18 1115 08/06/18 1239  BP: 128/66   135/70  Pulse: 80 77 77 79  Resp: 13 16 20 18   Temp:    98.2 F (36.8 C)  TempSrc:    Oral  SpO2: 98% 98% 98% 95%  Weight:      Height:        Intake/Output Summary (Last 24 hours) at 08/06/2018 1402 Last data filed at 08/06/2018 1018 Gross per 24 hour  Intake 450 ml  Output 25 ml  Net 425 ml   Filed Weights   08/02/18 1542 08/04/18 1515 08/04/18 1821  Weight: 59.7 kg 60.1 kg 57.8 kg    Examination: General appearance:  adult female, sleepy post-op, no acute distress.   HEENT:  Anicteric, conjunctiva pink, lids and lashes normal. No nasal deformity, discharge, epistaxis.  Lips moist, dentition normal, OP dry, no oral lesions, hearing normal.   Skin: Warm and dry.  No jaundice.  No suspicious rashes or lesions. Cardiac: Tachycardic, regular, nl S1-S2, no murmurs appreciated.  Capillary refill is brisk.  JVP not visible.  Trace right pitting LE edema.  Radial pulses 2+ and symmetric. Respiratory: Normal respiratory  rate and rhythm.  CTAB without rales or wheezes. Abdomen: Abdomen soft.  no TTP. No ascites, distension, hepatosplenomegaly.   MSK: No deformities or effusions, right midfoot in wound vac.   Neuro: Awake but sleepy.  EOMI, moves all extremities. Psych: Sensorium somnolent post-op hut responding to questions, attention diminished Affect blunted.    Data Reviewed: I have personally reviewed following labs and imaging studies:  CBC: Recent Labs  Lab 07/31/18 0300  08/01/18 0249 08/03/18 0440 08/04/18 0416 08/04/18 1826 08/06/18 0432  WBC 21.5*  --  20.3* 11.5* 11.2*  --  13.9*  NEUTROABS  --   --   --   --  7.8*  --   --   HGB 6.8*   < > 8.6* 8.6* 8.8* 9.5* 8.3*  HCT 20.3*   < > 25.2* 26.5* 27.0* 28.0* 26.5*  MCV 83.2  --  83.2 85.8 84.1  --  87.7  PLT 576*  --  516* 384 445*  --  314   < > = values in this interval not displayed.   Basic Metabolic Panel: Recent Labs  Lab 08/02/18 0400 08/03/18 0440 08/04/18 0416 08/04/18 1826 08/05/18 0412 08/06/18 0432  NA 138 138 135 135 134* 135  K 3.1* 3.9 4.1 3.4* 4.2 4.0  CL 100 100 94*  --  95* 94*  CO2 26 25 25   --  22 26  GLUCOSE 171* 355* 302*  --  324* 88  BUN 44* 22* 33*  --  19 22*  CREATININE 4.31* 3.22* 4.25*  --  3.10* 3.87*  CALCIUM 7.0* 6.5* 6.3*  --  6.9* 6.8*  PHOS 4.1 4.4 4.0  --  3.7 4.2   GFR: Estimated Creatinine Clearance: 16.6 mL/min (A) (by C-G formula based on SCr of 3.87 mg/dL (H)). Liver Function Tests: Recent Labs  Lab 07/31/18 0300  08/02/18 0400 08/03/18 0440 08/04/18 0416 08/05/18 0412 08/06/18 0432  AST 27  --   --   --   --   --   --   ALT 19  --   --   --   --   --   --   ALKPHOS 198*  --   --   --   --   --   --   BILITOT 1.8*  --   --   --   --   --   --   PROT 5.4*  --   --   --   --   --   --   ALBUMIN 1.8*   < > 1.5* 1.4* 1.5* 1.5* 1.5*   < > = values in this interval not displayed.   No results for input(s): LIPASE, AMYLASE in the last 168 hours. No results for input(s):  AMMONIA in the last 168 hours. Coagulation Profile: No results for input(s): INR, PROTIME in the last 168 hours. Cardiac Enzymes: No results for input(s): CKTOTAL, CKMB, CKMBINDEX, TROPONINI in the last 168 hours. BNP (last 3 results) No results for input(s): PROBNP in the last 8760 hours. HbA1C: No results for input(s): HGBA1C  in the last 72 hours. CBG: Recent Labs  Lab 08/06/18 0224 08/06/18 0631 08/06/18 0807 08/06/18 1036 08/06/18 1243  GLUCAP 86 78 88 160* 260*   Lipid Profile: No results for input(s): CHOL, HDL, LDLCALC, TRIG, CHOLHDL, LDLDIRECT in the last 72 hours. Thyroid Function Tests: No results for input(s): TSH, T4TOTAL, FREET4, T3FREE, THYROIDAB in the last 72 hours. Anemia Panel: No results for input(s): VITAMINB12, FOLATE, FERRITIN, TIBC, IRON, RETICCTPCT in the last 72 hours. Urine analysis:    Component Value Date/Time   COLORURINE YELLOW 07/29/2018 1524   APPEARANCEUR HAZY (A) 07/29/2018 1524   LABSPEC 1.010 07/29/2018 1524   PHURINE 5.0 07/29/2018 1524   GLUCOSEU >=500 (A) 07/29/2018 1524   GLUCOSEU 500 (A) 01/26/2015 1200   HGBUR SMALL (A) 07/29/2018 1524   BILIRUBINUR NEGATIVE 07/29/2018 1524   KETONESUR NEGATIVE 07/29/2018 1524   PROTEINUR >=300 (A) 07/29/2018 1524   UROBILINOGEN 0.2 01/26/2015 1200   NITRITE POSITIVE (A) 07/29/2018 1524   LEUKOCYTESUR SMALL (A) 07/29/2018 1524   Sepsis Labs: @LABRCNTIP (procalcitonin:4,lacticacidven:4)  ) Recent Results (from the past 240 hour(s))  Blood culture (routine x 2)     Status: Abnormal   Collection Time: 07/29/18  3:24 PM  Result Value Ref Range Status   Specimen Description   Final    BLOOD BLOOD RIGHT FOREARM Performed at [facility], [place] [address] [place], [place] [address]    Special Requests   Final    BOTTLES DRAWN AEROBIC AND ANAEROBIC Blood Culture adequate volume Performed at [place] [address], [place], [place] [address]    Culture   Setup Time   Final    GRAM POSITIVE COCCI IN BOTH AEROBIC AND ANAEROBIC BOTTLES CRITICAL RESULT CALLED TO, READ BACK BY AND VERIFIED WITH: Alinda Dooms, AT [id number] 08/01/18 BY Rush Landmark Performed at [facility], [place] [address], [place], [place] [address]    Culture METHICILLIN RESISTANT STAPHYLOCOCCUS AUREUS (A)  Final   Report Status 08/04/2018 FINAL  Final   Organism ID, Bacteria METHICILLIN RESISTANT STAPHYLOCOCCUS AUREUS  Final      Susceptibility   Methicillin resistant staphylococcus aureus - MIC*    CIPROFLOXACIN >=8 RESISTANT Resistant     ERYTHROMYCIN >=8 RESISTANT Resistant     GENTAMICIN <=0.5 SENSITIVE Sensitive     OXACILLIN >=4 RESISTANT Resistant     TETRACYCLINE >=16 RESISTANT Resistant     VANCOMYCIN 1 SENSITIVE Sensitive     TRIMETH/SULFA <=10 SENSITIVE Sensitive     CLINDAMYCIN RESISTANT Resistant     RIFAMPIN <=0.5 SENSITIVE Sensitive     Inducible Clindamycin POSITIVE Resistant     * METHICILLIN RESISTANT STAPHYLOCOCCUS AUREUS  Blood Culture ID Panel (Reflexed)     Status: Abnormal   Collection Time: 07/29/18  3:24 PM  Result Value Ref Range Status   Enterococcus species NOT DETECTED NOT DETECTED Final   Listeria monocytogenes NOT DETECTED NOT DETECTED Final   Staphylococcus species DETECTED (A) NOT DETECTED Final    Comment: CRITICAL RESULT CALLED TO, READ BACK BY AND VERIFIED WITH: Derry Lory PHARMD, AT [phone] 08/01/18 BY D. VANHOOK    Staphylococcus aureus (BCID) DETECTED (A) NOT DETECTED Final    Comment: Methicillin (oxacillin)-resistant Staphylococcus aureus (MRSA). MRSA is predictably resistant to beta-lactam antibiotics (except ceftaroline). Preferred therapy is vancomycin unless clinically contraindicated. Patient requires contact precautions if  hospitalized. CRITICAL RESULT CALLED TO, READ BACK BY AND VERIFIED WITH: Derry Lory [place], AT [phone] 08/01/18 BY D. Victoriano Lain  Methicillin resistance DETECTED (A) NOT DETECTED Final    Comment: CRITICAL RESULT  CALLED TO, READ BACK BY AND VERIFIED WITH: Derry Lory PHARMD, AT [phone] 08/01/18 BY D. VANHOOK    Streptococcus species NOT DETECTED NOT DETECTED Final   Streptococcus agalactiae NOT DETECTED NOT DETECTED Final   Streptococcus pneumoniae NOT DETECTED NOT DETECTED Final   Streptococcus pyogenes NOT DETECTED NOT DETECTED Final   Acinetobacter baumannii NOT DETECTED NOT DETECTED Final   Enterobacteriaceae species NOT DETECTED NOT DETECTED Final   Enterobacter cloacae complex NOT DETECTED NOT DETECTED Final   Escherichia coli NOT DETECTED NOT DETECTED Final   Klebsiella oxytoca NOT DETECTED NOT DETECTED Final   Klebsiella pneumoniae NOT DETECTED NOT DETECTED Final   Proteus species NOT DETECTED NOT DETECTED Final   Serratia marcescens NOT DETECTED NOT DETECTED Final   Haemophilus influenzae NOT DETECTED NOT DETECTED Final   Neisseria meningitidis NOT DETECTED NOT DETECTED Final   Pseudomonas aeruginosa NOT DETECTED NOT DETECTED Final   Candida albicans NOT DETECTED NOT DETECTED Final   Candida glabrata NOT DETECTED NOT DETECTED Final   Candida krusei NOT DETECTED NOT DETECTED Final   Candida parapsilosis NOT DETECTED NOT DETECTED Final   Candida tropicalis NOT DETECTED NOT DETECTED Final    Comment: Performed at [facility], [place]. [address], [place], [place] [address]  SARS Coronavirus 2 [facility] order, Performed in [facility] hospital lab)     Status: None   Collection Time: 07/29/18  3:57 PM  Result Value Ref Range Status   SARS Coronavirus 2 NEGATIVE NEGATIVE Final    Comment: (NOTE) If result is NEGATIVE SARS-CoV-2 target nucleic acids are NOT DETECTED. The SARS-CoV-2 RNA is generally detectable in upper and lower  respiratory specimens during the acute phase of infection. The lowest  concentration of SARS-CoV-2 viral copies this assay can detect is 250  copies / mL. A negative result does not preclude SARS-CoV-2 infection  and should not be used as the sole basis for treatment  or other  patient management decisions.  A negative result may occur with  improper specimen collection / handling, submission of specimen other  than nasopharyngeal swab, presence of viral mutation(s) within the  areas targeted by this assay, and inadequate number of viral copies  (<250 copies / mL). A negative result must be combined with clinical  observations, patient history, and epidemiological information. If result is POSITIVE SARS-CoV-2 target nucleic acids are DETECTED. The SARS-CoV-2 RNA is generally detectable in upper and lower  respiratory specimens dur ing the acute phase of infection.  Positive  results are indicative of active infection with SARS-CoV-2.  Clinical  correlation with patient history and other diagnostic information is  necessary to determine patient infection status.  Positive results do  not rule out bacterial infection or co-infection with other viruses. If result is PRESUMPTIVE POSTIVE SARS-CoV-2 nucleic acids MAY BE PRESENT.   A presumptive positive result was obtained on the submitted specimen  and confirmed on repeat testing.  While 2019 novel coronavirus  (SARS-CoV-2) nucleic acids may be present in the submitted sample  additional confirmatory testing may be necessary for epidemiological  and / or clinical management purposes  to differentiate between  SARS-CoV-2 and other Sarbecovirus currently known to infect humans.  If clinically indicated additional testing with an alternate test  methodology [phone]) is advised. The SARS-CoV-2 RNA is generally  detectable in upper and lower respiratory sp ecimens during the acute  phase of infection. The expected result is Negative.  Fact Sheet for Patients:  StrictlyIdeas.no Fact Sheet for Healthcare Providers: BankingDealers.co.za This test is not yet approved or cleared by the [place] FDA and has been authorized for detection and/or diagnosis of  SARS-CoV-2 by FDA under an Emergency Use Authorization (EUA).  This EUA will remain in effect (meaning this test can be used) for the duration of the COVID-19 declaration under Section 564(b)(1) of the Act, 21 U.S.C. section 360bbb-3(b)(1), unless the authorization is terminated or revoked sooner. Performed at [facility], [place] [address] [place], [place] [address]   MRSA PCR Screening     Status: Abnormal   Collection Time: 07/30/18  8:28 AM  Result Value Ref Range Status   MRSA by PCR POSITIVE (A) NEGATIVE Final    Comment:        The GeneXpert MRSA Assay (FDA approved for NASAL specimens only), is one component of a comprehensive MRSA colonization surveillance program. It is not intended to diagnose MRSA infection nor to guide or monitor treatment for MRSA infections. RESULT CALLED TO, READ BACK BY AND VERIFIED WITH: A. Lovena Le RN 11:15 07/30/18 ([email or web address]) Performed at [facility], [place] [address], [place], [place] [address]   Respiratory Panel by PCR     Status: None   Collection Time: 07/30/18  8:29 AM  Result Value Ref Range Status   Adenovirus NOT DETECTED NOT DETECTED Final   Coronavirus 229E NOT DETECTED NOT DETECTED Final    Comment: (NOTE) The Coronavirus on the Respiratory Panel, DOES NOT test for the novel  Coronavirus (2019 nCoV)    Coronavirus HKU1 NOT DETECTED NOT DETECTED Final   Coronavirus NL63 NOT DETECTED NOT DETECTED Final   Coronavirus OC43 NOT DETECTED NOT DETECTED Final   Metapneumovirus NOT DETECTED NOT DETECTED Final   Rhinovirus / Enterovirus NOT DETECTED NOT DETECTED Final   Influenza A NOT DETECTED NOT DETECTED Final   Influenza B NOT DETECTED NOT DETECTED Final   Parainfluenza Virus 1 NOT DETECTED NOT DETECTED Final   Parainfluenza Virus 2 NOT DETECTED NOT DETECTED Final   Parainfluenza Virus 3 NOT DETECTED NOT DETECTED Final   Parainfluenza Virus 4 NOT DETECTED NOT DETECTED Final   Respiratory Syncytial Virus NOT  DETECTED NOT DETECTED Final   Bordetella pertussis NOT DETECTED NOT DETECTED Final   Chlamydophila pneumoniae NOT DETECTED NOT DETECTED Final   Mycoplasma pneumoniae NOT DETECTED NOT DETECTED Final    Comment: Performed at [facility], [place]. [address], [place], [place] [address]  Fungus Culture With Stain     Status: None (Preliminary result)   Collection Time: 07/31/18  6:52 PM  Result Value Ref Range Status   Fungus Stain Final report  Final    Comment: (NOTE) Performed At: [facility] [place], [place] [id number] Rush Farmer MD QM:[id number]    Fungus (Mycology) Culture PENDING  Incomplete   Fungal Source ABSCESS  Final    Comment: RIGHT FOOT Performed at [facility], [place] [address] [place], [place] [address]   Aerobic/Anaerobic Culture (surgical/deep wound)     Status: None   Collection Time: 07/31/18  6:52 PM  Result Value Ref Range Status   Specimen Description ABSCESS RIGHT FOOT  Final   Special Requests NONE  Final   Gram Stain   Final    ABUNDANT WBC PRESENT,BOTH PMN AND MONONUCLEAR ABUNDANT GRAM POSITIVE COCCI    Culture   Final    MODERATE METHICILLIN RESISTANT STAPHYLOCOCCUS AUREUS NO ANAEROBES ISOLATED Performed at  [facility], [place] [address], [place], [place] [address]    Report Status 08/05/2018 FINAL  Final   Organism ID, Bacteria METHICILLIN RESISTANT STAPHYLOCOCCUS AUREUS  Final      Susceptibility   Methicillin resistant staphylococcus aureus - MIC*    CIPROFLOXACIN >=8 RESISTANT Resistant     ERYTHROMYCIN >=8 RESISTANT Resistant     GENTAMICIN <=0.5 SENSITIVE Sensitive     OXACILLIN >=4 RESISTANT Resistant     TETRACYCLINE >=16 RESISTANT Resistant     VANCOMYCIN 1 SENSITIVE Sensitive     TRIMETH/SULFA <=10 SENSITIVE Sensitive     CLINDAMYCIN RESISTANT Resistant     RIFAMPIN <=0.5 SENSITIVE Sensitive     Inducible Clindamycin POSITIVE Resistant     * MODERATE METHICILLIN RESISTANT STAPHYLOCOCCUS AUREUS    Fungus Culture Result     Status: None   Collection Time: 07/31/18  6:52 PM  Result Value Ref Range Status   Result 1 Comment  Final    Comment: (NOTE) KOH/Calcofluor preparation:  no fungus observed. Performed At: [facility] [place], [place] [id number] Rush Farmer MD VE:[id number]   Culture, blood (routine x 2)     Status: None   Collection Time: 08/01/18 10:50 AM  Result Value Ref Range Status   Specimen Description BLOOD LEFT HAND  Final   Special Requests   Final    BOTTLES DRAWN AEROBIC AND ANAEROBIC Blood Culture adequate volume   Culture   Final    NO GROWTH 5 DAYS Performed at [facility], [address], [place], [place] [address]    Report Status 08/06/2018 FINAL  Final  Culture, blood (routine x 2)     Status: None   Collection Time: 08/01/18 11:00 AM  Result Value Ref Range Status   Specimen Description BLOOD RIGHT HAND  Final   Special Requests   Final    AEROBIC BOTTLE ONLY Blood Culture results may not be optimal due to an inadequate volume of blood received in culture bottles   Culture   Final    NO GROWTH 5 DAYS Performed at [facility], [place] [address], [place], [place] [address]    Report Status 08/06/2018 FINAL  Final  Aerobic/Anaerobic Culture (surgical/deep wound)     Status: None (Preliminary result)   Collection Time: 08/04/18  8:19 PM  Result Value Ref Range Status   Specimen Description WOUND RIGHT FOOT  Final   Special Requests NONE  Final   Gram Stain   Final    RARE WBC PRESENT, PREDOMINANTLY MONONUCLEAR FEW GRAM POSITIVE COCCI Performed at [facility], [place] [address], [place],  [address]    Culture   Final    MODERATE STAPHYLOCOCCUS AUREUS SUSCEPTIBILITIES TO FOLLOW NO ANAEROBES ISOLATED; CULTURE IN PROGRESS FOR 5 DAYS    Report Status PENDING  Incomplete         Radiology Studies: Dg Chest Port 1 View  Result Date: 08/06/2018 CLINICAL DATA:  Status post placement of a  dialysis catheter 08/01/2018. EXAM: PORTABLE CHEST 1 VIEW COMPARISON:  Fluoroscopic spot view of the upper chest 08/01/2018. Single-view of the chest 07/29/2018. FINDINGS: Right IJ approach double lumen central venous catheter tip projects in the right atrium. Lungs are clear. No pneumothorax. Heart size is normal. No pleural effusion. No focal bony abnormality. IMPRESSION: Dialysis catheter tip projects in the right atrium. Negative for pneumothorax or acute disease. Electronically Signed   By: Inge Rise M.D.   On: 08/06/2018 11:42  Scheduled Meds:  amLODipine  5 mg Oral QHS   calcitRIOL  0.5 mcg Oral Daily   calcium acetate  1,334 mg Oral TID WC   Chlorhexidine Gluconate Cloth  6 each Topical Q0600   darbepoetin (ARANESP) injection - NON-DIALYSIS  200 mcg Subcutaneous Q Thu-1800   feeding supplement (NEPRO CARB STEADY)  237 mL Oral TID WC   heparin  5,000 Units Subcutaneous Q8H   insulin pump   Subcutaneous TID AC, HS, 0200   loratadine  10 mg Oral QHS   oxyCODONE  10 mg Oral Once   pravastatin  40 mg Oral Daily   sodium chloride flush  10-40 mL Intracatheter Q12H   sodium chloride flush  3 mL Intravenous Q12H   sodium chloride flush  3 mL Intravenous Q12H   [START ON 08/08/2018] vancomycin  500 mg Intravenous Q M,W,F-HD   vancomycin variable dose per unstable renal function (pharmacist dosing)   Does not apply See admin instructions   Continuous Infusions:  sodium chloride     sodium chloride     sodium chloride     sodium chloride     sodium chloride       LOS: 8 days    Time spent: 35 minutes    Edwin Dada, MD [facility] 08/06/2018, 2:02 PM     Please page through [place]:  [email or web address] Password [email or web address] If 7PM-7AM, please contact night-coverage

## 2018-08-06 NOTE — Progress Notes (Signed)
Patient has been accepted at [facility] for OP HD seat with a schedule of TTS at 12:15pm. She needs to arrive at 11:45am on her first day of treatment to sign paperwork.   [facility] [address] [place],  [address] [phone]  Patient is currently in HD and, therefore, Renal Navigator is unable to reach her. Renal Navigator will follow up with patient at a later time. Renal Navigator notified Dr. Bhandari/Nephrology of OP HD seat schedule.  Alphonzo Cruise Renal Navigator [phone]

## 2018-08-06 NOTE — Op Note (Signed)
    OPERATIVE REPORT  DATE OF SURGERY: 08/06/2018  PATIENT: [name], 48 y.o. female MRN: [id number]  DOB: 12-25-1970  PRE-OPERATIVE DIAGNOSIS: End-stage renal disease  POST-OPERATIVE DIAGNOSIS:  Same  PROCEDURE: #1 change temporary to tunneled right IJ hemodialysis catheter, #2 left brachiobasilic fistula  SURGEON:  Curt Jews, M.D.  PHYSICIAN ASSISTANT: Liana Crocker, PA-C  ANESTHESIA: General  EBL: per anesthesia record  Total I/O In: 450 [I.V.:450] Out: 25 [Blood:25]  BLOOD ADMINISTERED: none  DRAINS: none  SPECIMEN: none  COUNTS CORRECT:  YES  PATIENT DISPOSITION:  PACU - hemodynamically stable  PROCEDURE DETAILS: Patient was taken the operative placed was with area of the right and left neck and chest prepped draped usual sterile fashion.  Patient did have an existing temporary catheter.  A guidewire was passed through this and this was down to the level of the right atrium was confirmed with fluoroscopy.  The existing temporary catheter was removed.  Dilator and peel-away sheath was passed over the guidewire and a 23 cm catheter positions of the distal right atrium.  The peel-away sheath was removed.  The catheter was brought through a subcutaneous tunnel through a separate stab incision and the 2 lm ports were attached.  Both lumens flushed and aspirated easily and were locked 1000/cc heparin.  Catheter secured the skin with 3-0 nylon stitch and the entry site was closed with 4 subcuticular Vicryl stitch  Attention was then turned to the left arm.  SonoSite ultrasound was used to visualize the veins.  The patient had extremely small cephalic vein.  Did have a moderate-sized basilic vein.  Incision was made between the level of the brachial artery and the basilic vein.  The vein was of moderate size and had the tributary branches ligated with 3-0 and 4-0 silk ties and divided.  The vein was ligated distally and brought to approximation of the brachial artery.   The patient had a very small brachial artery.  The artery was occluded proximally distally and was opened with 11 blade sent longitudinally with Potts scissors.  The vein was cut to the proper length spatulated and sewn end-to-side to the artery with a running 6-0 Prolene suture.  Clamps removed and thrill was noted.  Wounds irrigated with saline.  Hemostasis electrocautery.  The wounds were closed with 3-0 Vicryl in the subcutaneous and subcuticular tissue.  Sterile dressing was applied and the patient was transferred to the recovery room in stable condition   Rosetta Posner, M.D., [facility] 08/06/2018 10:48 AM

## 2018-08-06 NOTE — Progress Notes (Signed)
Patient alert and oriented X4, patient blood sugar is 68, patient had not eaten dinner and refused to eat. Offer snack patient refused, patient drunk 240 ml of orange juice and refused D50. Patient stated the orange juice is enough for her and she doesn't need anything else. Educated patient about sugar dropping and her need to eat something. Patient refused. Patient use inappropriate language toward me and asked me to leave her alone. Educated patient and report to charge nurse.

## 2018-08-06 NOTE — Interval H&P Note (Signed)
History and Physical Interval Note:  08/06/2018 8:05 AM  [name]  has presented today for surgery, with the diagnosis of END STAGE RENAL DISEASE FOR HEMODIALYSIS ACCESS.  The various methods of treatment have been discussed with the patient and family. After consideration of risks, benefits and other options for treatment, the patient has consented to  Procedure(s): INSERTION OF TUNNELED DIALYSIS CATHETER (N/A) INSERTION OF ARTERIOVENOUS (AV) GORE-TEX GRAFT LEFT ARM (Left) as a surgical intervention.  The patient's history has been reviewed, patient examined, no change in status, stable for surgery.  I have reviewed the patient's chart and labs.  Questions were answered to the patient's satisfaction.     Curt Jews

## 2018-08-06 NOTE — Op Note (Addendum)
   OPERATIVE REPORT Patient name: [name] MRN: [id number] DOB: [birth year]  DOS:  08/04/2018  Preop Dx: s/p incision and drainage of abscess right.  Cellulitis right lower extremity.  Osteomyelitis right foot.  Postop Dx: same  Procedure:  1.  Irrigation and debridement right lower extremity  Surgeon: Edrick Kins DPM  Anesthesia: Monitored anesthesia care  Hemostasis: None EBL: Minimal mL Materials: None Injectables: None Pathology: Deep wound cultures taken for aerobic/anaerobic/Gram stain  Condition: The patient tolerated the procedure and anesthesia well. No complications noted or reported   Justification for procedure: The patient is a 48 y.o. female T1DM poorly controlled with multiple comorbidities who presents today for surgical irrigation and debridement. The patient was told benefits as well as possible side effects of the surgery. The patient consented for surgical correction. The patient consent form was reviewed. All patient questions were answered. No guarantees were expressed or implied. The patient and the surgeon both signed the patient consent form with the witness present and placed in the patient's chart.   Procedure in Detail: The patient was brought to the operating room, placed in the operating table in the supine position at which time an aseptic scrub and drape were performed about the patient's respective lower extremity after anesthesia was induced as described above. Attention was then directed to the surgical area where procedure number one commenced.  Procedure #1: Irrigation and debridement right lower extremity  The open wound/incision site from prior incision and drainage was evaluated intraoperatively and all necrotic nonviable debris and tissue was sharply dissected away.  Debridement performed was down to the level of bone with necrotic portions of bone also removed.  Any purulent drainage was expressed from the open wound site. 3 L of  normal saline was utilized with pulse lavage for copious irrigation.  At that time the open wound/incision site was evaluated and the skin edges were not pliable enough for any primary closure.  Intraoperative decision was made to apply negative pressure wound VAC to the open wound.  Wound VAC was applied in the standard manner followed by dry sterile dressings.  The patient was then transferred from the operating room to the recovery room having tolerated the procedure and anesthesia well. All vital signs are stable. After a brief stay in the recovery room the patient was readmitted to inpatient room. Instructions were provided for the patient regarding wound care.    Orders placed for wound care, wound VAC dressing changes MWF while inpatient.  Upon discharge the patient will require arrangement of home health wound VAC dressing changes 2x / week.   Edrick Kins, DPM [facility]  Dr. Edrick Kins, [place]                                        [place], [place] [address]                Office [phone]  Fax [phone]

## 2018-08-06 NOTE — Transfer of Care (Signed)
Immediate Anesthesia Transfer of Care Note  Patient: EULOGIA DISMORE  Procedure(s) Performed: INSERTION OF TUNNELED DIALYSIS CATHETER (Right Chest) INSERTION OF BASCILIC VEN TRANSPOSITION 1ST STAGE (Left Arm Upper)  Patient Location: PACU  Anesthesia Type:General  Level of Consciousness: awake, alert  and oriented  Airway & Oxygen Therapy: Patient Spontanous Breathing and Patient connected to face mask oxygen  Post-op Assessment: Report given to RN and Post -op Vital signs reviewed and stable  Post vital signs: Reviewed and stable  Last Vitals:  Vitals Value Taken Time  BP 116/87   Temp    Pulse 67 08/06/2018 10:40 AM  Resp 9 08/06/2018 10:40 AM  SpO2 100 % 08/06/2018 10:40 AM  Vitals shown include unvalidated device data.  Last Pain:  Vitals:   08/06/18 0745  TempSrc:   PainSc: 0-No pain      Patients Stated Pain Goal: 0 (53/79/43 2761)  Complications: No apparent anesthesia complications

## 2018-08-06 NOTE — Progress Notes (Addendum)
Patient called out asking for pain meds. This RN went in to assess patient. Patient will not give a number to rate her pain, just says "it's hurting bad, I don't know it's bad" complaining of burning and discomfort in R foot/leg. PRN oxycodone given.   Will continue to monitor.

## 2018-08-06 NOTE — Anesthesia Postprocedure Evaluation (Signed)
Anesthesia Post Note  Patient: [name]  Procedure(s) Performed: INSERTION OF TUNNELED DIALYSIS CATHETER (Right Chest) INSERTION OF BASCILIC VEN TRANSPOSITION 1ST STAGE (Left Arm Upper)     Patient location during evaluation: PACU Anesthesia Type: General Level of consciousness: sedated Pain management: pain level controlled Vital Signs Assessment: post-procedure vital signs reviewed and stable Respiratory status: spontaneous breathing and respiratory function stable Cardiovascular status: stable Postop Assessment: no apparent nausea or vomiting Anesthetic complications: no    Last Vitals:  Vitals:   08/06/18 1110 08/06/18 1115  BP:    Pulse: 77 77  Resp: 16 20  Temp:    SpO2: 98% 98%    Last Pain:  Vitals:   08/06/18 1115  TempSrc:   PainSc: 0-No pain                 Arryana Tolleson DANIEL

## 2018-08-06 NOTE — Anesthesia Procedure Notes (Signed)
Procedure Name: Intubation Date/Time: 08/06/2018 8:43 AM Performed by: Alain Marion, CRNA Pre-anesthesia Checklist: Patient identified, Emergency Drugs available, Suction available and Patient being monitored Patient Re-evaluated:Patient Re-evaluated prior to induction Oxygen Delivery Method: Circle System Utilized Preoxygenation: Pre-oxygenation with 100% oxygen Induction Type: IV induction and Rapid sequence Laryngoscope Size: Miller and 2 Grade View: Grade I Tube type: Oral Tube size: 7.0 mm Number of attempts: 1 Airway Equipment and Method: Stylet and Oral airway Placement Confirmation: ETT inserted through vocal cords under direct vision,  positive ETCO2 and breath sounds checked- equal and bilateral Secured at: 21 cm Tube secured with: Tape Dental Injury: Teeth and Oropharynx as per pre-operative assessment

## 2018-08-07 ENCOUNTER — Encounter (HOSPITAL_COMMUNITY): Payer: Self-pay | Admitting: Vascular Surgery

## 2018-08-07 ENCOUNTER — Telehealth: Payer: Self-pay | Admitting: Podiatry

## 2018-08-07 DIAGNOSIS — Z8614 Personal history of Methicillin resistant Staphylococcus aureus infection: Secondary | ICD-10-CM

## 2018-08-07 DIAGNOSIS — Z89419 Acquired absence of unspecified great toe: Secondary | ICD-10-CM

## 2018-08-07 DIAGNOSIS — Z8631 Personal history of diabetic foot ulcer: Secondary | ICD-10-CM

## 2018-08-07 DIAGNOSIS — Z89429 Acquired absence of other toe(s), unspecified side: Secondary | ICD-10-CM

## 2018-08-07 LAB — GLUCOSE, CAPILLARY
Glucose-Capillary: 110 mg/dL — ABNORMAL HIGH (ref 70–99)
Glucose-Capillary: 117 mg/dL — ABNORMAL HIGH (ref 70–99)
Glucose-Capillary: 160 mg/dL — ABNORMAL HIGH (ref 70–99)
Glucose-Capillary: 163 mg/dL — ABNORMAL HIGH (ref 70–99)
Glucose-Capillary: 278 mg/dL — ABNORMAL HIGH (ref 70–99)

## 2018-08-07 MED ORDER — CHLORHEXIDINE GLUCONATE CLOTH 2 % EX PADS: 6.0000 | MEDICATED_PAD | Freq: Every day | CUTANEOUS | Status: DC

## 2018-08-07 MED ORDER — DARBEPOETIN ALFA 200 MCG/0.4ML IJ SOSY
200.0000 ug | PREFILLED_SYRINGE | INTRAMUSCULAR | Status: DC
  Administered 2018-08-08: 10:00:00 200 ug via INTRAVENOUS
  Filled 2018-08-07: qty 0.4

## 2018-08-07 NOTE — Telephone Encounter (Signed)
I called [facility] - D. Ezequiel Ganser and informed that pt was a hospital admit and surgery was performed in hospital and that nursing was calling for paperwork to be completed for wound vac ordered in-hospital. D[facility] states hospital or [place] would take care of the paperwork.

## 2018-08-07 NOTE — Progress Notes (Signed)
   PROGRESS NOTE : PODIATRY S/p irrigation and debridement. DOS: 08/04/2018  Patient was visited this p.m. after recently returning from dialysis.  Negative pressure wound VAC is intact to the open incision site to the right ankle.  No leaking observed.  At this time we will continue wound VAC dressing changes to see if we can develop granulation tissue within the open wound of the right ankle.    S/p irrigation and debridement RT ankle Abscess RT ankle Cellulitis RT ankle Osteomyelitis RT ankle  - Wound care orders were placed postoperatively for wound VAC changes MWF.   - Patient will need to be discharged with home wound VAC.  Orders placed with case management. - No further inpatient irrigation and debridement planned at the moment. - Again, the patient would benefit from below-knee amputation however she is not agreeable to this treatment option. - Upon discharge, recommend wound care management at the [facility] wound care center, since she is well-established there   Edrick Kins, DPM [facility]  Dr. Edrick Kins, DPM    [address], [place] [address]                Office [phone]  Fax [phone]

## 2018-08-07 NOTE — Discharge Instructions (Signed)
Carbohydrate Counting for Diabetes Mellitus, Adult  Carbohydrate counting is a method of keeping track of how many carbohydrates you eat. Eating carbohydrates naturally increases the amount of sugar (glucose) in the blood. Counting how many carbohydrates you eat helps keep your blood glucose within normal limits, which helps you manage your diabetes (diabetes mellitus). It is important to know how many carbohydrates you can safely have in each meal. This is different for every person. A diet and nutrition specialist (registered dietitian) can help you make a meal plan and calculate how many carbohydrates you should have at each meal and snack. Carbohydrates are found in the following foods:  Grains, such as breads and cereals.  Dried beans and soy products.  Starchy vegetables, such as potatoes, peas, and corn.  Fruit and fruit juices.  Milk and yogurt.  Sweets and snack foods, such as cake, cookies, candy, chips, and soft drinks. How do I count carbohydrates? There are two ways to count carbohydrates in food. You can use either of the methods or a combination of both. Reading "Nutrition Facts" on packaged food The "Nutrition Facts" list is included on the labels of almost all packaged foods and beverages in the [place] It includes:  The serving size.  Information about nutrients in each serving, including the grams (g) of carbohydrate per serving. To use the Nutrition Facts":  Decide how many servings you will have.  Multiply the number of servings by the number of carbohydrates per serving.  The resulting number is the total amount of carbohydrates that you will be having. Learning standard serving sizes of other foods When you eat carbohydrate foods that are not packaged or do not include "Nutrition Facts" on the label, you need to measure the servings in order to count the amount of carbohydrates:  Measure the foods that you will eat with a food scale or measuring cup, if  needed.  Decide how many standard-size servings you will eat.  Multiply the number of servings by 15. Most carbohydrate-rich foods have about 15 g of carbohydrates per serving. ? For example, if you eat 8 oz (170 g) of strawberries, you will have eaten 2 servings and 30 g of carbohydrates (2 servings x 15 g = 30 g).  For foods that have more than one food mixed, such as soups and casseroles, you must count the carbohydrates in each food that is included. The following list contains standard serving sizes of common carbohydrate-rich foods. Each of these servings has about 15 g of carbohydrates:   hamburger bun or  English muffin.   oz (15 mL) syrup.   oz (14 g) jelly.  1 slice of bread.  1 six-inch tortilla.  3 oz (85 g) cooked rice or pasta.  4 oz (113 g) cooked dried beans.  4 oz (113 g) starchy vegetable, such as peas, corn, or potatoes.  4 oz (113 g) hot cereal.  4 oz (113 g) mashed potatoes or  of a large baked potato.  4 oz (113 g) canned or frozen fruit.  4 oz (120 mL) fruit juice.  4-6 crackers.  6 chicken nuggets.  6 oz (170 g) unsweetened dry cereal.  6 oz (170 g) plain fat-free yogurt or yogurt sweetened with artificial sweeteners.  8 oz (240 mL) milk.  8 oz (170 g) fresh fruit or one small piece of fruit.  24 oz (680 g) popped popcorn. Example of carbohydrate counting Sample meal  3 oz (85 g) chicken breast.  6 oz (170 g)  brown rice.  4 oz (113 g) corn.  8 oz (240 mL) milk.  8 oz (170 g) strawberries with sugar-free whipped topping. Carbohydrate calculation 1. Identify the foods that contain carbohydrates: ? Rice. ? Corn. ? Milk. ? Strawberries. 2. Calculate how many servings you have of each food: ? 2 servings rice. ? 1 serving corn. ? 1 serving milk. ? 1 serving strawberries. 3. Multiply each number of servings by 15 g: ? 2 servings rice x 15 g = 30 g. ? 1 serving corn x 15 g = 15 g. ? 1 serving milk x 15 g = 15 g. ? 1  serving strawberries x 15 g = 15 g. 4. Add together all of the amounts to find the total grams of carbohydrates eaten: ? 30 g + 15 g + 15 g + 15 g = 75 g of carbohydrates total. Summary  Carbohydrate counting is a method of keeping track of how many carbohydrates you eat.  Eating carbohydrates naturally increases the amount of sugar (glucose) in the blood.  Counting how many carbohydrates you eat helps keep your blood glucose within normal limits, which helps you manage your diabetes.  A diet and nutrition specialist (registered dietitian) can help you make a meal plan and calculate how many carbohydrates you should have at each meal and snack. This information is not intended to replace advice given to you by your health care provider. Make sure you discuss any questions you have with your health care provider. Document Released: 03/19/2005 Document Revised: 09/26/2016 Document Reviewed: 08/31/2015 Elsevier Interactive Patient Education  2019 [place], Adult Foot care is an important part of your health. Noticing and addressing any potential problems early is the best way to prevent future foot problems. How to care for your [place] your feet daily with warm water and mild soap. Do not use hot water. Then, pat your feet and the areas between your toes until they are completely dry. Do not soak your feet as this can dry your skin.  Trim your toenails straight across. Do not dig under them or around the cuticle. File the edges of your nails with an emery board or nail file.  On the skin on your feet and on dry, brittle nails, apply a moisturizing lotion or petroleum jelly that is unscented and does not contain alcohol. Do not apply lotion between your toes. Shoes and Socks   Wear clean socks or stockings every day. Make sure they are not too tight.  Wear shoes that fit properly and have enough cushioning. To break in new shoes, wear them for just a few  hours a day. This prevents you from injuring your feet. Always look in your shoes before you put them on to be sure there are no objects inside. Wounds, Scrapes, Corns, and Calluses  Check your feet daily for blisters, cuts, and redness. If you cannot see the bottom of your feet, use a mirror or ask someone for help.  Do not cut corns or calluses. Do not try to remove them with medicine.  If you find a minor scrape, cut, or break in the skin on your feet, keep it and the skin around it clean and dry. These areas may be cleaned with mild soap and water. Do not clean the area with peroxide, alcohol, or iodine.  If you have a wound, scrape, corn, or callus on your foot, look at it several times a day to make sure it  is healing and is not infected. Check for: ? More redness, swelling, or pain. ? More fluid or blood. ? Warmth. ? Pus or a bad smell. General Instructions  Do not cross your legs. That may decrease the blood flow to your feet.  Do not use heating pads or hot water bottles on your feet. They may burn your skin. If you have lost feeling in your feet or legs, you may not know it is happening until it is too late.  Make sure your health care provider does a complete foot exam at least annually or more often if you have foot problems. If you have foot problems, report any cuts, sores, or bruises to your health care provider immediately. Contact a health care provider if:  You have a medical condition that increases your risk of infection and you have any cuts, sores, or bruises on your feet.  You have an injury that is not healing.  You notice redness on your legs or feet.  You feel burning or tingling in your legs or feet.  You have pain or cramps in your legs or feet.  Your legs or feet are numb.  Your feet always feel cold.  You have pain around a toenail. Get help right away if:  You have a wound, scrape, corn, or callus on your foot and: ? You have more redness,  swelling, or pain. ? You have more fluid or blood. ? Your wound, scrape, corn, or callus feels warm to the touch. ? You have pus or a bad smell coming from the wound, scrape, corn, or callus. ? You have a fever.  You have a red line going up your leg. This information is not intended to replace advice given to you by your health care provider. Make sure you discuss any questions you have with your health care provider. Document Released: 08/26/2015 Document Revised: 11/08/2015 Document Reviewed: 08/26/2015 Elsevier Interactive Patient Education  2019 [place]. Hyperglycemia Hyperglycemia occurs when the level of sugar (glucose) in the blood is too high. Glucose is a type of sugar that provides the body's main source of energy. Certain hormones (insulin and glucagon) control the level of glucose in the blood. Insulin lowers blood glucose, and glucagon increases blood glucose. Hyperglycemia can result from having too little insulin in the bloodstream, or from the body not responding normally to insulin. Hyperglycemia occurs most often in people who have diabetes (diabetes mellitus), but it can happen in people who do not have diabetes. It can develop quickly, and it can be life-threatening if it causes you to become severely dehydrated (diabetic ketoacidosis or hyperglycemic hyperosmolar state). Severe hyperglycemia is a medical emergency. What are the causes? If you have diabetes, hyperglycemia may be caused by:  Diabetes medicine.  Medicines that increase blood glucose or affect your diabetes control.  Not eating enough, or not eating often enough.  Changes in physical activity level.  Being sick or having an infection. If you have prediabetes or undiagnosed diabetes:  Hyperglycemia may be caused by those conditions. If you do not have diabetes, hyperglycemia may be caused by:  Certain medicines, including steroid medicines, beta-blockers, epinephrine, and thiazide  diuretics.  Stress.  Serious illness.  Surgery.  Diseases of the pancreas.  Infection. What increases the risk? Hyperglycemia is more likely to develop in people who have risk factors for diabetes, such as:  Having a family member with diabetes.  Having a gene for type 1 diabetes that is passed from parent to  child (inherited).  Living in an area with cold weather conditions.  Exposure to certain viruses.  Certain conditions in which the body's disease-fighting (immune) system attacks itself (autoimmune disorders).  Being overweight or obese.  Having an inactive (sedentary) lifestyle.  Having been diagnosed with insulin resistance.  Having a history of prediabetes, gestational diabetes, or polycystic ovarian syndrome (PCOS).  Being of American-Indian, African-American, Hispanic/Latino, or Asian/Pacific Islander descent. What are the signs or symptoms? Hyperglycemia may not cause any symptoms. If you do have symptoms, they may include early warning signs, such as:  Increased thirst.  Hunger.  Feeling very tired.  Needing to urinate more often than usual.  Blurry vision. Other symptoms may develop if hyperglycemia gets worse, such as:  Dry mouth.  Loss of appetite.  Fruity-smelling breath.  Weakness.  Unexpected or rapid weight gain or weight loss.  Tingling or numbness in the hands or feet.  Headache.  Skin that does not quickly return to normal after being lightly pinched and released (poor skin turgor).  Abdominal pain.  Cuts or bruises that are slow to heal. How is this diagnosed? Hyperglycemia is diagnosed with a blood test to measure your blood glucose level. This blood test is usually done while you are having symptoms. Your health care provider may also do a physical exam and review your medical history. You may have more tests to determine the cause of your hyperglycemia, such as:  A fasting blood glucose (FBG) test. You will not be allowed  to eat (you will fast) for at least 8 hours before a blood sample is taken.  An A1c (hemoglobin A1c) blood test. This provides information about blood glucose control over the previous 2-3 months.  An oral glucose tolerance test (OGTT). This measures your blood glucose at two times: ? After fasting. This is your baseline blood glucose level. ? Two hours after drinking a beverage that contains glucose. How is this treated? Treatment depends on the cause of your hyperglycemia. Treatment may include:  Taking medicine to regulate your blood glucose levels. If you take insulin or other diabetes medicines, your medicine or dosage may be adjusted.  Lifestyle changes, such as exercising more, eating healthier foods, or losing weight.  Treating an illness or infection, if this caused your hyperglycemia.  Checking your blood glucose more often.  Stopping or reducing steroid medicines, if these caused your hyperglycemia. If your hyperglycemia becomes severe and it results in hyperglycemic hyperosmolar state, you must be hospitalized and given IV fluids. Follow these instructions at home:  General instructions  Take over-the-counter and prescription medicines only as told by your health care provider.  Do not use any products that contain nicotine or tobacco, such as cigarettes and e-cigarettes. If you need help quitting, ask your health care provider.  Limit alcohol intake to no more than 1 drink per day for nonpregnant women and 2 drinks per day for men. One drink equals 12 oz of beer, 5 oz of wine, or 1 oz of hard liquor.  Learn to manage stress. If you need help with this, ask your health care provider.  Keep all follow-up visits as told by your health care provider. This is important. Eating and drinking   Maintain a healthy weight.  Exercise regularly, as directed by your health care provider.  Stay hydrated, especially when you exercise, get sick, or spend time in hot  temperatures.  Eat healthy foods, such as: ? Lean proteins. ? Complex carbohydrates. ? Fresh fruits and vegetables. ?  Low-fat dairy products. ? Healthy fats.  Drink enough fluid to keep your urine clear or pale yellow. If you have diabetes:  Make sure you know the symptoms of hyperglycemia.  Follow your diabetes management plan, as told by your health care provider. Make sure you: ? Take your insulin and medicines as directed. ? Follow your exercise plan. ? Follow your meal plan. Eat on time, and do not skip meals. ? Check your blood glucose as often as directed. Make sure to check your blood glucose before and after exercise. If you exercise longer or in a different way than usual, check your blood glucose more often. ? Follow your sick day plan whenever you cannot eat or drink normally. Make this plan in advance with your health care provider.  Share your diabetes management plan with people in your workplace, school, and household.  Check your urine for ketones when you are ill and as told by your health care provider.  Carry a medical alert card or wear medical alert jewelry. Contact a health care provider if:  Your blood glucose is at or above 240 mg/dL (13.3 mmol/L) for 2 days in a row.  You have problems keeping your blood glucose in your target range.  You have frequent episodes of hyperglycemia. Get help right away if:  You have difficulty breathing.  You have a change in how you think, feel, or act (mental status).  You have nausea or vomiting that does not go away. These symptoms may represent a serious problem that is an emergency. Do not wait to see if the symptoms will go away. Get medical help right away. Call your local emergency services (911 in the [place] Do not drive yourself to the hospital. Summary  Hyperglycemia occurs when the level of sugar (glucose) in the blood is too high.  Hyperglycemia is diagnosed with a blood test to measure your blood  glucose level. This blood test is usually done while you are having symptoms. Your health care provider may also do a physical exam and review your medical history.  If you have diabetes, follow your diabetes management plan as told by your health care provider.  Contact your health care provider if you have problems keeping your blood glucose in your target range. This information is not intended to replace advice given to you by your health care provider. Make sure you discuss any questions you have with your health care provider. Document Released: 09/12/2000 Document Revised: 12/05/2015 Document Reviewed: 12/05/2015 Elsevier Interactive Patient Education  2019 [place]. Chronic Kidney Disease Stage 5 Nutrition Therapy When your kidneys are not working well, it is time to change what you eat and drink so that your body will work well and you will feel better. This meal plan will help control the levels of sodium, potassium, and phosphorus in your body; it will prevent bone loss; and it will preserve good nutrition status for you.   Too much sodium (salt) in your diet may make it harder to control blood pressure, increase your thirst, and make your body retain too much fluid.  High blood levels of phosphorus may be an early sign that your kidney failure is affecting your bones.   If blood levels of potassium become too high, you will need to eat fewer high potassium fruits and vegetables.  This helps to protect your heart. Extra fluid is normally removed by the kidneys. Because your kidneys are not working, you must limit how much you drink. If your body  retains fluids, your ankles and feet may swell. Fluid may also build up in your lungs, and cause shortness of breath and other more serious problems. Tips Meal Planning Tips  Stick to the foods and portions shown on your meal plan.  Plan menus based on what you usually eat. Add snacks as desired.  Take your phosphorus binder as  directed, usually with every meal and snack.  Carefully plan the fluids that you eat and drink to avoid having too much. When measuring fluids, include all liquids (water and anything that melts into a liquid at room temperature).  Plan ahead for special occasion meals and for when you eat at restaurants so you can choose the foods that are best for you.  Develop simple, quick menus for days that you cant cook. Foods Recommended Adapted from [facility]al Diet. Maurie Boettcher. A Healthy Food Guide for [employer] on Dialysis. [place], [place]: American Dietetic Association Renal Practice Group; 2002. Protein Choices Animal Protein Foods You may have 4-5 ounces of protein at meals. One serving is:  Beef (1 ounce)  Egg replacements ( cup)  Eggs (1 large)  Fish (1 ounce)  Lamb (1 ounce)  Pork (1 ounce)  Poultry (1 ounce)  Shellfish (1 ounce)  Veal (1 ounce)  [employer] Food (1 ounce) Fruit and Vegetable Choices The following three charts list fruits and vegetables that have low, medium, and high potassium content. Balance the foods you eat from all three lists so you dont get too much potassium. Note: Unless otherwise stated, all servings are  cup. Low-Potassium (less than 150 milligrams) Fruit, Fruit Juices, and Vegetables You may have 3-4 servings each day.   Fruits Vegetables  Apples (1) Apple juice Applesauce Apricot nectar Blackberries Blueberries Cranberries Cranberry juice and cranberry juice cocktails Fruit cocktail Gooseberries Grape juice Grapes Lemons Lemon juice Limes Lime juice Papaya nectar Peaches (canned) and peach nectar Pears (canned) and pear nectar Pineapples Plums (1) Raspberries Strawberries Tangerines (1) Watermelon Alfalfa sprouts Bamboo shoots (canned) Bean sprouts Beets (canned) Cabbage Carrots Cauliflower Corn Cucumber Endive Eggplant Green beans Lettuce (all types, 1 cup) Mushrooms (raw) Onions Radishes Water chestnuts  (canned) Watercress    Medium-Potassium (150-250 milligrams) Fruit, Fruit Juices, and Vegetables You may have 1-2 servings each day. Fruits Vegetables  Cantaloupe Cherries Figs (2 whole) Grapefruit Grapefruit juice Mango and mango nectar Papayas Peaches (fresh) Pears (fresh) Rhubarb Asparagus Broccoli Celery Kale Mixed vegetables Peas Peppers Summer squash Turnips Zucchini  High-Potassium (more than 250 milligrams) Fruits, Fruit Juices, and Vegetables You may have 1-2 servings each day. Fruits Vegetables  Apricots (3) Bananas (1 small) Dates ( cup) Honeydew melon Kiwifruit Nectarine Orange (1) Orange juice Prune juice Prunes (5) Raisins Artichokes Avocado Bamboo shoots (fresh, raw) Beets (fresh) Brussels sprouts [place] (such as beet, collard, and mustard) Kohlrabi Mushrooms (cooked) Okra Parsnips Potatoes Pumpkins [place]ag[place] Spinach Sweet potatoes Tomatoes Tomato sauce or puree Tomato juice or V-8 juice, low sodium Wax beans Winter squashes Yams  Dairy and Other High-Phosphorus Choices Limit your food choices from the list below of high-phosphorus foods. You may have  1 serving of high-phosphorus food choices each day. Biscuits, muffins (1 small) Cake (1 slice, 2  2-inch piece) Cheese (1 ounce) Condensed and evaporated milk ( cup) Cooked, dried beans and peas ( cup) Cottage cheese ( cup) Granola, oatmeal ( cup) Ice milk or ice cream ( cup) Light cream or half-and-half ( cup) Milk, all kinds ( cup) Milkshake ( cup) Non-dairy milk replacements (1 cup) Nut butters (  2 tablespoons) Nuts ( cup) Organ meats (1 ounce) Pancakes, waffles (1; 4-inch diameter) Pudding, custard ( cup) Sardines (1 ounce) Soy milk (1 cup) Tofu ( cup) Tortillas, corn (2 6-inch diameter) Vegetarian meat replacements (such as Garden burgers and Boca burgers) (2 ounces) Whole wheat cereal, bran cereals ( cup) Yogurt, plain or fruit-flavored (  cup)  Bread, Cereal and Grain Choices You may have 8-10 choices each day. Breads and Rolls Bagel ( small) Bread, all kinds (1 slice or 1 ounce) Bun, hamburger or hot dog type () Cornbread, homemade (1 piece or 2 ounces) [place] pastry or sweet roll ( small) Dinner roll or hard roll (1 small) Doughnut (1 small) English muffin () Pita or pocket bread (; 6-inch diameter) Tortilla, flour (1; 6-inch diameter)  Cereals and Grains Low-sodium dry cereals (such as Corn Pops, Sugar Smacks, puffed wheat, or puffed rice) (1 cup or 1 ounce) Cooked cereals (such as cream of rice, cream of wheat, farina, Malt-o-Meal) ( cup) Grits, cooked ( cup) Pasta, cooked (such as noodles, macaroni, spaghetti) ( cup) Rice, cooked ( cup)  Crackers and Snacks Crackers, unsalted (4; 2-inch crackers) Graham crackers (3 squares) Melba toast (3 oblong pieces) Popcorn, unsalted (1 cups popped) Pretzels, unsalted sticks or rings ( ounce or 10 sticks) Tortilla chips, unsalted ( ounce or 9 chips)  Desserts Sugar cookies (4) Shortbread cookies (4) Sugar wafers (4) Vanilla wafers (10)  Grain Foods with Added Sodium and Phosphorus Biscuits, muffins (1 small) Cake (1/20 round cake, or 2 x 2 inch square) Cornbread from mix (1 piece or 2 ounces) Fruit pie (1/8th of a pie) Oatmeal ( cup) Pancakes, waffles (1, 4-inch diameter) Pretzels, salted sticks or rings (ounce or 10 sticks) Dry cereals, most brands (cup) RyKrisp (3 crackers) Sandwich cookie (4) Whole-wheat cereals, bran cereals ( cup)  Fluid Choices Note: Fluids are any drink or food that is liquid at room temperature. You may have 5 cups of fluid each day. Clear or fruit-flavored drinks that have bubbles Coffee Fruit drinks Fruit or vegetable juice Ice cream, frozen yogurt, sherbet Milk, non-dairy creamers Popsicles, juice bars Soup Tea Water Jell-O  Foods Not Recommended Adapted from [employer] Renal Diet Lanae Boast [place]. A Healthy Food Guide  for [employer] on Dialysis. [place], [place]: American Dietetic Association Renal Practice Group; 2002.) Avoid eating too much of these foods:  Foods that are high in sodium (salt), potassium, and/or phosphorus  High-potassium fruits and vegetables. Note: If your blood potassium level is high, it will be especially important to limit these foods.  High-phosphorus foods  Whole wheat products--check with your dietitian before eating these, especially if your blood phosphorus is high. Sodium (Salt)  Dont add salt to your food in cooking or at the table.  Read labels and choose foods with 300 milligrams (mg) of sodium per serving or less.  Dont eat the following foods:  o Table salt and salt blends o Soy sauce, other Asian sauces o Convenience foods (such as frozen dinners, canned or dried soups, stews, casseroles, and deli meals) o Fast foods (commercial hamburgers, pizzas, and tacos) o Cured or processed meats and cheeses o Salted snack foods  Vegetables that are canned or processed with salt Chronic Kidney Disease Stage 5 Sample 1-Day Menu View Nutrient Info  Breakfast 1/2 cup cranberry juice 1 egg 2 slice toast 2 teaspoons jam 1 cup coffee  Lunch 2 slices whole-wheat bread 3 ounces sliced [place] 2 leaves lettuce 2 teaspoons mayonnaise 1/2 cup cucumber slices 1 tablespoon oil and  vinegar dressing 1 medium apple 1 cup lemonade  Evening Meal 3 ounces broiled fish 1/2 cup rice 1/2 cup green beans 1 cup tossed salad 1 tablespoon oil and vinegar dressing 1 dinner roll 2 teaspoons margarine 1/2 cup sliced peaches 1 cup tea, iced  Evening Snack 1 slice pound cake 1/2 cup gelatin dessert   Nancy Parker, MS, RD, LDN Clinical Dietitian Office phone # [phone]     [facility]  Discharge Instructions  AV Fistula or Graft Surgery for Dialysis Access  Please refer to the following instructions for your post-procedure care. Your surgeon  or physician assistant will discuss any changes with you.  Activity  You may drive the day following your surgery, if you are comfortable and no longer taking prescription pain medication. Resume full activity as the soreness in your incision resolves.  Bathing/Showering  You may shower after you go home. Keep your incision dry for 48 hours. Do not soak in a bathtub, hot tub, or swim until the incision heals completely. You may not shower if you have a hemodialysis catheter.  Incision Care  Clean your incision with mild soap and water after 48 hours. Pat the area dry with a clean towel. You do not need a bandage unless otherwise instructed. Do not apply any ointments or creams to your incision. You may have skin glue on your incision. Do not peel it off. It will come off on its own in about one week. Your arm may swell a bit after surgery. To reduce swelling use pillows to elevate your arm so it is above your heart. Your doctor will tell you if you need to lightly wrap your arm with an ACE bandage.  Diet  Resume your normal diet. There are not special food restrictions following this procedure. In order to heal from your surgery, it is CRITICAL to get adequate nutrition. Your body requires vitamins, minerals, and protein. Vegetables are the best source of vitamins and minerals. Vegetables also provide the perfect balance of protein. Processed food has little nutritional value, so try to avoid this.  Medications  Resume taking all of your medications. If your incision is causing pain, you may take over-the counter pain relievers such as acetaminophen (Tylenol). If you were prescribed a stronger pain medication, please be aware these medications can cause nausea and constipation. Prevent nausea by taking the medication with a snack or meal. Avoid constipation by drinking plenty of fluids and eating foods with high amount of fiber, such as fruits, vegetables, and grains.  Do not take Tylenol if you  are taking prescription pain medications.  Follow up Your surgeon may want to see you in the office following your access surgery. If so, this will be arranged at the time of your surgery.  Please call us immediately for any of the following conditions:  Increased pain, redness, drainage (pus) from your incision site Fever of 101 degrees or higher Severe or worsening pain at your incision site Hand pain or numbness.  Reduce your risk of vascular disease:  Stop smoking. If you would like help, call [facility] at 1-800-QUIT-NOW (367)201-9644) or [place] [facility] [place] your cholesterol Maintain a desired weight Control your diabetes Keep your blood pressure down  Dialysis  It will take several weeks to several months for your new dialysis access to be ready for use. Your surgeon will determine when it is okay to use it. Your nephrologist will continue to direct your dialysis. You can  continue to use your Permcath until your new access is ready for use.   08/06/2018 DAMYIAH MOXLEY [id number] 1971/01/27  Surgeon(s): Early, Arvilla Meres, MD  Procedure(s): INSERTION OF TUNNELED DIALYSIS CATHETER Creation of left 1st stage basilic vein transposition  x Do not stick fistula for 12 weeks    If you have any questions, please call the office at [phone].

## 2018-08-07 NOTE — Progress Notes (Signed)
PROGRESS NOTE    [name]  GGE:[id number] DOB: Apr 02, 1971 DOA: 07/29/2018 PCP: Loyola Mast, PA-C      Brief Narrative:  [name] is a 48 y.o. F with T1DM, previous partial foot amputations, and HTN who presented with right foot swelling from wound clinic, found in the ER to have tachycardia, leukocytosis 20K, and MRI with tenosynovitis extending into the right tarsal joint as well as osteomyelitis of the medial cuneiform and bases of 1st and 2nd metatarsals as well as acute renal failure with Cr 7.0 mg/dL and BUN 101.      Assessment & Plan:  Acute renal failure S/p brachiobasilic left fistula No UOP documented yesterday.  Has been placed to [facility] in [place]. -Monitor I/Os -Check  renal function panel daily, refused today -Consult Nephrology, appreciate cares -Calcitriol, PhosLo, Aranesp per nephrology   Sepsis from right foot diabetic foot infection, MRSA bacteremia Presented with tachycardia, leukocytosis and organ failure.  Source right foot.  MRSA bacteremia Likely source right foot. Initial cultures 4/28 -- 1/1 +MRSA Repeat cultures 5/1 -- NGTD x2 No vascular catheter or implanted devices present (HD cath first placed 5/1, at which time cultures were negative) Echo shows no vegetation No back or neck pain, no other metastatic sources suspected at this time.  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  Glucoses labile. Improved today. -Check CBG TID AC and HS -Coached on use of home insulin pump via home insulin pump  -Continue pravastatin -Hold aspirin for now, resume at d/c  Hypertension BP normal -Continue amlodipine -D/c Lasix  Other medications -Continue antihistamine -Hold oral hormone   SARS-CoV-2 testing was obtained for screening purposes.  COVID ruled out.      MDM and disposition: The below labs and imaging reports were reviewed and summarized above.  Medication management as above.  The patient was admitted with acute renal failure and sepsis from a diabetic foot infection.  She remains in oliguric renal failure, and is now been placed to a dialysis center in community.  We will have her work with physical therapy, and progress towards discharge in the next 24 to 48 hours.     DVT prophylaxis: Heparin Code Status: FULL Family Communication: sister by phone    Consultants:   ID Nephrology  Podiatry  Vascular surgery  Procedures:   Doppler US right LE  Vein mapping LUE  Echocardiogram  Fistula placement, left, brachiobasilic     Antimicrobials:   Cefepime x1  Vancomycin 4/28 >>  Meropenem 4/29 >> 5/2     Subjective: Hypoglycemia overnight, very irritable about how she was treated.  No change in right leg strangling or pain.  No new respiratory distress, cough, rashes, joint pains, neck pain, headache.  She is still blind.  No fever.  Objective: Vitals:   08/06/18 1730 08/06/18 1754 08/06/18 2023 08/07/18 0038  BP: (!) 144/71 125/65 127/64 128/61  Pulse: 74 72 78 83  Resp:  16    Temp:  98.2 F (36.8 C) 98.9 F (37.2 C) 99 F (37.2 C)  TempSrc:  Oral    SpO2:  96% 98% 97%  Weight:  55.4 kg    Height:        Intake/Output Summary (Last 24 hours) at 08/07/2018 1202 Last data filed at 08/07/2018 0900 Gross per 24 hour  Intake 343.33 ml  Output 1000 ml  Net -656.67 ml   Filed Weights   08/04/18 1821 08/06/18 1352 08/06/18 1754  Weight: 57.8 kg 56.9 kg 55.4 kg    Examination: General appearance: Adult female, lying in bed, no acute  distress, interactive. HEENT: Anicteric, conjunctival pink, lids and lashes normal.  No nasal deformity, discharge, or epistaxis.  Lips moist, dentition normal, oropharynx moist, no oral lesions.  Hearing normal. Skin: Skin warm and dry, no suspicious rashes or lesions, slight redness of the right leg to mid shin, slightly tender.   Cardiac: Cardiac, regular, no murmurs appreciated.  JVP not visible, trace right pitting lower extremity edema. Respiratory: Normal respiratory rate and rhythm, lungs clear without rales or wheezes. Abdomen: Abdomen soft without tenderness palpation, ascites, distention, or hepatosplenomegaly. MSK: The right foot is wrapped, has redness and swelling above the bandage.  There is a wound VAC applied to the right foot.  Left foot has several partial foot amputations. Neuro: Awake and alert, extraocular movements intact, moves all extremities with normal strength and coordination, sensation intact light touch, speech fluent. Psych: Term intact and responding to questions, attention normal, affect blunted, judgment and insight appear slightly magical.       Data Reviewed: I have personally reviewed following labs and imaging studies:  CBC: Recent Labs  Lab 08/01/18 0249 08/03/18 0440 08/04/18 0416 08/04/18 1826 08/06/18 0432  WBC 20.3* 11.5* 11.2*  --  13.9*  NEUTROABS  --   --  7.8*  --   --   HGB 8.6* 8.6* 8.8* 9.5* 8.3*  HCT 25.2* 26.5* 27.0* 28.0* 26.5*  MCV 83.2 85.8 84.1  --  87.7  PLT 516* 384 445*  --  144   Basic Metabolic Panel: Recent Labs  Lab 08/02/18 0400 08/03/18 0440 08/04/18 0416 08/04/18 1826 08/05/18 0412 08/06/18 0432  NA 138 138 135 135 134* 135  K 3.1* 3.9 4.1 3.4* 4.2 4.0  CL 100 100 94*  --  95* 94*  CO2 26 25 25   --  22 26  GLUCOSE 171* 355* 302*  --  324* 88  BUN 44* 22* 33*  --  19 22*  CREATININE 4.31* 3.22* 4.25*  --  3.10* 3.87*  CALCIUM 7.0* 6.5* 6.3*  --  6.9* 6.8*  PHOS 4.1 4.4 4.0  --  3.7 4.2   GFR:  Estimated Creatinine Clearance: 15.9 mL/min (A) (by C-G formula based on SCr of 3.87 mg/dL (H)). Liver Function Tests: Recent Labs  Lab 08/02/18 0400 08/03/18 0440 08/04/18 0416 08/05/18 0412 08/06/18 0432  ALBUMIN 1.5* 1.4* 1.5* 1.5* 1.5*   No results for input(s): LIPASE, AMYLASE in the last 168 hours. No results for input(s): AMMONIA in the last 168 hours. Coagulation Profile: No results for input(s): INR, PROTIME in the last 168 hours. Cardiac Enzymes: No results for input(s): CKTOTAL, CKMB, CKMBINDEX, TROPONINI in the last 168 hours. BNP (last 3 results) No results for input(s): PROBNP in the last 8760 hours. HbA1C: No results for input(s): HGBA1C in the last 72 hours. CBG: Recent Labs  Lab 08/06/18 2012 08/06/18 2053 08/06/18 2301 08/07/18 0036 08/07/18 0809  GLUCAP 68* 80 172* 163* 117*   Lipid Profile: No results for input(s): CHOL, HDL, LDLCALC, TRIG, CHOLHDL, LDLDIRECT in the last 72 hours. Thyroid Function Tests: No results  for input(s): TSH, T4TOTAL, FREET4, T3FREE, THYROIDAB in the last 72 hours. Anemia Panel: No results for input(s): VITAMINB12, FOLATE, FERRITIN, TIBC, IRON, RETICCTPCT in the last 72 hours. Urine analysis:    Component Value Date/Time   COLORURINE YELLOW 07/29/2018 1524   APPEARANCEUR HAZY (A) 07/29/2018 1524   LABSPEC 1.010 07/29/2018 1524   PHURINE 5.0 07/29/2018 1524   GLUCOSEU >=500 (A) 07/29/2018 1524   GLUCOSEU 500 (A) 01/26/2015 1200   HGBUR SMALL (A) 07/29/2018 1524   BILIRUBINUR NEGATIVE 07/29/2018 1524   KETONESUR NEGATIVE 07/29/2018 1524   PROTEINUR >=300 (A) 07/29/2018 1524   UROBILINOGEN 0.2 01/26/2015 1200   NITRITE POSITIVE (A) 07/29/2018 1524   LEUKOCYTESUR SMALL (A) 07/29/2018 1524   Sepsis Labs: @LABRCNTIP (procalcitonin:4,lacticacidven:4)  ) Recent Results (from the past 240 hour(s))  Blood culture (routine x 2)     Status: Abnormal   Collection Time: 07/29/18  3:24 PM  Result Value Ref Range Status    Specimen Description   Final    BLOOD BLOOD RIGHT FOREARM Performed at [facility], [place] [address], [place], [place] [address]    Special Requests   Final    BOTTLES DRAWN AEROBIC AND ANAEROBIC Blood Culture adequate volume Performed at [place] [address] [place], [place] [address]    Culture  Setup Time   Final    GRAM POSITIVE COCCI IN BOTH AEROBIC AND ANAEROBIC BOTTLES CRITICAL RESULT CALLED TO, READ BACK BY AND VERIFIED WITH: Alinda Dooms, AT 1194 08/01/18 BY Rush Landmark Performed at [facility], [place] [address], [place],  [address]    Culture METHICILLIN RESISTANT STAPHYLOCOCCUS AUREUS (A)  Final   Report Status 08/04/2018 FINAL  Final   Organism ID, Bacteria METHICILLIN RESISTANT STAPHYLOCOCCUS AUREUS  Final      Susceptibility   Methicillin resistant staphylococcus aureus - MIC*    CIPROFLOXACIN >=8 RESISTANT Resistant     ERYTHROMYCIN >=8 RESISTANT Resistant     GENTAMICIN <=0.5 SENSITIVE Sensitive     OXACILLIN >=4 RESISTANT Resistant     TETRACYCLINE >=16 RESISTANT Resistant     VANCOMYCIN 1 SENSITIVE Sensitive     TRIMETH/SULFA <=10 SENSITIVE Sensitive     CLINDAMYCIN RESISTANT Resistant     RIFAMPIN <=0.5 SENSITIVE Sensitive     Inducible Clindamycin POSITIVE Resistant     * METHICILLIN RESISTANT STAPHYLOCOCCUS AUREUS  Blood Culture ID Panel (Reflexed)     Status: Abnormal   Collection Time: 07/29/18  3:24 PM  Result Value Ref Range Status   Enterococcus species NOT DETECTED NOT DETECTED Final   Listeria monocytogenes NOT DETECTED NOT DETECTED Final   Staphylococcus species DETECTED (A) NOT DETECTED Final    Comment: CRITICAL RESULT CALLED TO, READ BACK BY AND VERIFIED WITH: Derry Lory PHARMD, AT 1448 08/01/18 BY D. VANHOOK    Staphylococcus aureus (BCID) DETECTED (A) NOT DETECTED Final    Comment: Methicillin (oxacillin)-resistant Staphylococcus aureus (MRSA). MRSA is predictably resistant to beta-lactam  antibiotics (except ceftaroline). Preferred therapy is vancomycin unless clinically contraindicated. Patient requires contact precautions if  hospitalized. CRITICAL RESULT CALLED TO, READ BACK BY AND VERIFIED WITH: Derry Lory PHARMD, AT [phone] 08/01/18 BY D. VANHOOK    Methicillin resistance DETECTED (A) NOT DETECTED Final    Comment: CRITICAL RESULT CALLED TO, READ BACK BY AND VERIFIED WITH: Derry Lory PHARMD, AT [phone] 08/01/18 BY D. VANHOOK    Streptococcus species NOT DETECTED NOT DETECTED Final   Streptococcus agalactiae NOT DETECTED NOT DETECTED Final  Streptococcus pneumoniae NOT DETECTED NOT DETECTED Final   Streptococcus pyogenes NOT DETECTED NOT DETECTED Final   Acinetobacter baumannii NOT DETECTED NOT DETECTED Final   Enterobacteriaceae species NOT DETECTED NOT DETECTED Final   Enterobacter cloacae complex NOT DETECTED NOT DETECTED Final   Escherichia coli NOT DETECTED NOT DETECTED Final   Klebsiella oxytoca NOT DETECTED NOT DETECTED Final   Klebsiella pneumoniae NOT DETECTED NOT DETECTED Final   Proteus species NOT DETECTED NOT DETECTED Final   Serratia marcescens NOT DETECTED NOT DETECTED Final   Haemophilus influenzae NOT DETECTED NOT DETECTED Final   Neisseria meningitidis NOT DETECTED NOT DETECTED Final   Pseudomonas aeruginosa NOT DETECTED NOT DETECTED Final   Candida albicans NOT DETECTED NOT DETECTED Final   Candida glabrata NOT DETECTED NOT DETECTED Final   Candida krusei NOT DETECTED NOT DETECTED Final   Candida parapsilosis NOT DETECTED NOT DETECTED Final   Candida tropicalis NOT DETECTED NOT DETECTED Final    Comment: Performed at [facility], [place] [address], [place], [place] [address]  SARS Coronavirus 2 [facility] order, Performed in [facility] hospital lab)     Status: None   Collection Time: 07/29/18  3:57 PM  Result Value Ref Range Status   SARS Coronavirus 2 NEGATIVE NEGATIVE Final    Comment: (NOTE) If result is NEGATIVE SARS-CoV-2 target nucleic acids  are NOT DETECTED. The SARS-CoV-2 RNA is generally detectable in upper and lower  respiratory specimens during the acute phase of infection. The lowest  concentration of SARS-CoV-2 viral copies this assay can detect is 250  copies / mL. A negative result does not preclude SARS-CoV-2 infection  and should not be used as the sole basis for treatment or other  patient management decisions.  A negative result may occur with  improper specimen collection / handling, submission of specimen other  than nasopharyngeal swab, presence of viral mutation(s) within the  areas targeted by this assay, and inadequate number of viral copies  (<250 copies / mL). A negative result must be combined with clinical  observations, patient history, and epidemiological information. If result is POSITIVE SARS-CoV-2 target nucleic acids are DETECTED. The SARS-CoV-2 RNA is generally detectable in upper and lower  respiratory specimens dur ing the acute phase of infection.  Positive  results are indicative of active infection with SARS-CoV-2.  Clinical  correlation with patient history and other diagnostic information is  necessary to determine patient infection status.  Positive results do  not rule out bacterial infection or co-infection with other viruses. If result is PRESUMPTIVE POSTIVE SARS-CoV-2 nucleic acids MAY BE PRESENT.   A presumptive positive result was obtained on the submitted specimen  and confirmed on repeat testing.  While 2019 novel coronavirus  (SARS-CoV-2) nucleic acids may be present in the submitted sample  additional confirmatory testing may be necessary for epidemiological  and / or clinical management purposes  to differentiate between  SARS-CoV-2 and other Sarbecovirus currently known to infect humans.  If clinically indicated additional testing with an alternate test  methodology [phone]) is advised. The SARS-CoV-2 RNA is generally  detectable in upper and lower respiratory sp ecimens  during the acute  phase of infection. The expected result is Negative. Fact Sheet for Patients:  StrictlyIdeas.no Fact Sheet for Healthcare Providers: BankingDealers.co.za This test is not yet approved or cleared by the [place] FDA and has been authorized for detection and/or diagnosis of SARS-CoV-2 by FDA under an Emergency Use Authorization (EUA).  This EUA will remain in effect (meaning  this test can be used) for the duration of the COVID-19 declaration under Section 564(b)(1) of the Act, 21 U.S.C. section 360bbb-3(b)(1), unless the authorization is terminated or revoked sooner. Performed at [facility], [place] [address], [place], [place] [address]   MRSA PCR Screening     Status: Abnormal   Collection Time: 07/30/18  8:28 AM  Result Value Ref Range Status   MRSA by PCR POSITIVE (A) NEGATIVE Final    Comment:        The GeneXpert MRSA Assay (FDA approved for NASAL specimens only), is one component of a comprehensive MRSA colonization surveillance program. It is not intended to diagnose MRSA infection nor to guide or monitor treatment for MRSA infections. RESULT CALLED TO, READ BACK BY AND VERIFIED WITH: A. Lovena Le RN 11:15 07/30/18 ([email or web address]) Performed at [facility], [place] [address], [place], [place] [address]   Respiratory Panel by PCR     Status: None   Collection Time: 07/30/18  8:29 AM  Result Value Ref Range Status   Adenovirus NOT DETECTED NOT DETECTED Final   Coronavirus 229E NOT DETECTED NOT DETECTED Final    Comment: (NOTE) The Coronavirus on the Respiratory Panel, DOES NOT test for the novel  Coronavirus (2019 nCoV)    Coronavirus HKU1 NOT DETECTED NOT DETECTED Final   Coronavirus NL63 NOT DETECTED NOT DETECTED Final   Coronavirus OC43 NOT DETECTED NOT DETECTED Final   Metapneumovirus NOT DETECTED NOT DETECTED Final   Rhinovirus / Enterovirus NOT DETECTED NOT DETECTED Final    Influenza A NOT DETECTED NOT DETECTED Final   Influenza B NOT DETECTED NOT DETECTED Final   Parainfluenza Virus 1 NOT DETECTED NOT DETECTED Final   Parainfluenza Virus 2 NOT DETECTED NOT DETECTED Final   Parainfluenza Virus 3 NOT DETECTED NOT DETECTED Final   Parainfluenza Virus 4 NOT DETECTED NOT DETECTED Final   Respiratory Syncytial Virus NOT DETECTED NOT DETECTED Final   Bordetella pertussis NOT DETECTED NOT DETECTED Final   Chlamydophila pneumoniae NOT DETECTED NOT DETECTED Final   Mycoplasma pneumoniae NOT DETECTED NOT DETECTED Final    Comment: Performed at [facility], [place]. [address] [place], [place] [address]  Fungus Culture With Stain     Status: None (Preliminary result)   Collection Time: 07/31/18  6:52 PM  Result Value Ref Range Status   Fungus Stain Final report  Final    Comment: (NOTE) Performed At: [facility] [place], [place] [id number] Rush Farmer MD VQ:[id number]    Fungus (Mycology) Culture PENDING  Incomplete   Fungal Source ABSCESS  Final    Comment: RIGHT FOOT Performed at [facility], [place] [address], [place], [place] [address]   Aerobic/Anaerobic Culture (surgical/deep wound)     Status: None   Collection Time: 07/31/18  6:52 PM  Result Value Ref Range Status   Specimen Description ABSCESS RIGHT FOOT  Final   Special Requests NONE  Final   Gram Stain   Final    ABUNDANT WBC PRESENT,BOTH PMN AND MONONUCLEAR ABUNDANT GRAM POSITIVE COCCI    Culture   Final    MODERATE METHICILLIN RESISTANT STAPHYLOCOCCUS AUREUS NO ANAEROBES ISOLATED Performed at [facility], [place] [address], [place],  [address]    Report Status 08/05/2018 FINAL  Final   Organism ID, Bacteria METHICILLIN RESISTANT STAPHYLOCOCCUS AUREUS  Final      Susceptibility   Methicillin resistant staphylococcus aureus - MIC*    CIPROFLOXACIN >=8 RESISTANT Resistant  ERYTHROMYCIN >=8 RESISTANT Resistant     GENTAMICIN <=0.5 SENSITIVE  Sensitive     OXACILLIN >=4 RESISTANT Resistant     TETRACYCLINE >=16 RESISTANT Resistant     VANCOMYCIN 1 SENSITIVE Sensitive     TRIMETH/SULFA <=10 SENSITIVE Sensitive     CLINDAMYCIN RESISTANT Resistant     RIFAMPIN <=0.5 SENSITIVE Sensitive     Inducible Clindamycin POSITIVE Resistant     * MODERATE METHICILLIN RESISTANT STAPHYLOCOCCUS AUREUS  Fungus Culture Result     Status: None   Collection Time: 07/31/18  6:52 PM  Result Value Ref Range Status   Result 1 Comment  Final    Comment: (NOTE) KOH/Calcofluor preparation:  no fungus observed. Performed At: [facility] [place], [place] [id number] Rush Farmer MD HM:[id number]   Culture, blood (routine x 2)     Status: None   Collection Time: 08/01/18 10:50 AM  Result Value Ref Range Status   Specimen Description BLOOD LEFT HAND  Final   Special Requests   Final    BOTTLES DRAWN AEROBIC AND ANAEROBIC Blood Culture adequate volume   Culture   Final    NO GROWTH 5 DAYS Performed at [facility], [address], [place], [place] [address]    Report Status 08/06/2018 FINAL  Final  Culture, blood (routine x 2)     Status: None   Collection Time: 08/01/18 11:00 AM  Result Value Ref Range Status   Specimen Description BLOOD RIGHT HAND  Final   Special Requests   Final    AEROBIC BOTTLE ONLY Blood Culture results may not be optimal due to an inadequate volume of blood received in culture bottles   Culture   Final    NO GROWTH 5 DAYS Performed at [facility], [place] [address] [place], [place] [address]    Report Status 08/06/2018 FINAL  Final  Aerobic/Anaerobic Culture (surgical/deep wound)     Status: None (Preliminary result)   Collection Time: 08/04/18  8:19 PM  Result Value Ref Range Status   Specimen Description WOUND RIGHT FOOT  Final   Special Requests NONE  Final   Gram Stain   Final    RARE WBC PRESENT, PREDOMINANTLY MONONUCLEAR FEW GRAM POSITIVE COCCI Performed at [facility], [place] [address], [place], [place] [address]    Culture   Final    MODERATE METHICILLIN RESISTANT STAPHYLOCOCCUS AUREUS NO ANAEROBES ISOLATED; CULTURE IN PROGRESS FOR 5 DAYS    Report Status PENDING  Incomplete   Organism ID, Bacteria METHICILLIN RESISTANT STAPHYLOCOCCUS AUREUS  Final      Susceptibility   Methicillin resistant staphylococcus aureus - MIC*    CIPROFLOXACIN >=8 RESISTANT Resistant     ERYTHROMYCIN >=8 RESISTANT Resistant     GENTAMICIN <=0.5 SENSITIVE Sensitive     OXACILLIN >=4 RESISTANT Resistant     TETRACYCLINE >=16 RESISTANT Resistant     VANCOMYCIN <=0.5 SENSITIVE Sensitive     TRIMETH/SULFA <=10 SENSITIVE Sensitive     CLINDAMYCIN RESISTANT Resistant     RIFAMPIN <=0.5 SENSITIVE Sensitive     Inducible Clindamycin POSITIVE Resistant     * MODERATE METHICILLIN RESISTANT STAPHYLOCOCCUS AUREUS         Radiology Studies: Dg Chest Port 1 View  Result Date: 08/06/2018 CLINICAL DATA:  Status post placement of a dialysis catheter 08/01/2018. EXAM: PORTABLE CHEST 1 VIEW COMPARISON:  Fluoroscopic spot view of the upper chest 08/01/2018. Single-view of the chest 07/29/2018. FINDINGS: Right IJ approach double  lumen central venous catheter tip projects in the right atrium. Lungs are clear. No pneumothorax. Heart size is normal. No pleural effusion. No focal bony abnormality. IMPRESSION: Dialysis catheter tip projects in the right atrium. Negative for pneumothorax or acute disease. Electronically Signed   By: Inge Rise M.D.   On: 08/06/2018 11:42        Scheduled Meds: . amLODipine  5 mg Oral QHS  . calcitRIOL  0.5 mcg Oral Daily  . calcium acetate  1,334 mg Oral TID WC  . Chlorhexidine Gluconate Cloth  6 each Topical Q0600  . darbepoetin (ARANESP) injection - NON-DIALYSIS  200 mcg Subcutaneous Q Thu-1800  . feeding supplement (NEPRO CARB STEADY)  237 mL Oral TID WC  . heparin  5,000 Units Subcutaneous Q8H  . insulin pump   Subcutaneous TID AC, HS,  0200  . loratadine  10 mg Oral QHS  . oxyCODONE  10 mg Oral Once  . pravastatin  40 mg Oral Daily  . sodium chloride flush  10-40 mL Intracatheter Q12H  . sodium chloride flush  3 mL Intravenous Q12H  . vancomycin variable dose per unstable renal function (pharmacist dosing)   Does not apply See admin instructions   Continuous Infusions: . sodium chloride       LOS: 9 days    Time spent: 25 minutes    Edwin Dada, MD [facility] 08/07/2018, 12:02 PM     Please page through [place]:  [email or web address] Password [email or web address] If 7PM-7AM, please contact night-coverage

## 2018-08-07 NOTE — Progress Notes (Addendum)
[facility]  Subjective  - Comfortable after left arm surgery.   Objective 128/61 83 99 F (37.2 C) 16 97%  Intake/Output Summary (Last 24 hours) at 08/07/2018 0757 Last data filed at 08/06/2018 1808 Gross per 24 hour  Intake 553.33 ml  Output 1025 ml  Net -471.67 ml    Left BC fistula with palpable thrill and palpable distal radial pulse  Right TDC intact dressing clean and dry  Assessment/Planning: POD # 1 #1 change temporary to tunneled right IJ hemodialysis catheter, #2 left brachiobasilic fistula  F/U with Dr. Donnetta Hutching in 6 weeks for fistula duplex   Roxy Horseman 08/07/2018 7:57 AM --  Laboratory Lab Results: Recent Labs    08/04/18 1826 08/06/18 0432  WBC  --  13.9*  HGB 9.5* 8.3*  HCT 28.0* 26.5*  PLT  --  314   BMET Recent Labs    08/05/18 0412 08/06/18 0432  NA 134* 135  K 4.2 4.0  CL 95* 94*  CO2 22 26  GLUCOSE 324* 88  BUN 19 22*  CREATININE 3.10* 3.87*  CALCIUM 6.9* 6.8*    COAG Lab Results  Component Value Date   INR 1.4 (H) 07/30/2018   INR 1.65 (H) 09/01/2014   INR 1.07 03/25/2013   No results found for: PTT  I have seen and evaluated the patient. I agree with the PA note as documented above. POD#1 s/p TDC and left brachiobasilic fistula.  Good thrill.  Palpable radial pulse at wrist.  Will sign off and arrange f/u in 4-6 weeks for fistula duplex and evaluation for 2nd stage fistula.  Marty Heck, MD [facility] Office: [phone] Pager: [phone]

## 2018-08-07 NOTE — TOC Progression Note (Addendum)
Transition of Care [facility]) - Progression Note    Patient Details  Name: [name] MRN: [id number] Date of Birth: 06/16/1970  Transition of Care [facility]) CM/SW Contact  Sharin Mons, RN Phone Number: 08/07/2018, 3:50 PM  Clinical Narrative:     NCM received consult:Patient will need to be discharged with a home wound VAC unit. Patient will also require home health nurse wound VAC changes 2x/week x 6 weeks.    NCM discussed with pt d/c need. Offered choice for home health services. [facility] selected. Referral made to [facility]. Referral made to [facility] for wound vac, [phone], fax [phone]. NCM faxed demographics and wound op note. NCM called and spoke with Mendel Ryder @ Dr.Brent's office Office [phone], needing insurance Authorization form completed for vac. Mendel Ryder to speak with Dr.Brent's nurse and f/u with NCM informing of route taken to get forms completed. NCM to leave forms on front  of chart @ nursing station.  Expected Discharge Plan: [place] Barriers to Discharge: Continued Medical Work up  Expected Discharge Plan and Services Expected Discharge Plan: [place]                                               Social Determinants of Health (SDOH) Interventions    Readmission Risk Interventions No flowsheet data found.

## 2018-08-07 NOTE — Progress Notes (Signed)
pts blood glucose 278 when RN asked pt how much insulin she was giving herself on her pump pt stated "I don't remember and I wont be eating lunch today"  When RN explained to pt that she needs to let nurse know how much insulin  she is giving herself as it is part on the insulin pump contract, pt replied "Oh well". Nurse educated pt on the terms of the insulin pump contract and safety with using pump in hospital pt still refused to let nurse know the amount of insulin she gave herself on her pump. Dr. Loleta Books notified

## 2018-08-07 NOTE — Progress Notes (Signed)
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 S/p incision and drainage with multiple areas of infection with specimens obtained positive for Staphylococcus aureus and blood cultures with MRSA bacteremia.  Diabetic foot infection/osteomyelitis status post incision and drainage - surgical specimen is showing Staphylococcus aureus with sensitivities pending although likely MRSA given previous history and MRSA bacteremia.  Podiatry continuing to recommend below-knee amputation and will continue wound VAC and referral to wound care center at discharge in the meantime.  Discussed plan of care and ongoing treatment. Will need at least 6 weeks of therapy with plan for vancomycin with dialysis with end date set for 09/12/18.   MRSA bacteremia - repeat cultures on 5/1 without growth to date.  Previously inserted temporary dialysis catheter removed yesterday.  Has remained afebrile. Most likely source remains her right foot. Plan to continue vancomycin until 09/12/18.   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 Discussed importance of continued control to reduce further risk of complicated wound healing and end organ damage. Continue management per primary team and endocrinology.   New ESRD - Tunneled catheter and brachiobasilic fistula placed yesterday with plans for continued outpatient dialysis.  Therapeutic Drug Monitoring - Continue vancomycin per pharmacy protocol with dialysis. No adverse side effects.    Principal Problem:   Osteomyelitis of right foot ([employer]) Active Problems:   Type 1 diabetes  mellitus with neurological manifestations, uncontrolled ([place])   DM type 1 causing renal disease ([place])   . amLODipine  5 mg Oral QHS  . calcitRIOL  0.5 mcg Oral Daily  . calcium acetate  1,334 mg Oral TID WC  . Chlorhexidine Gluconate Cloth  6 each Topical Q0600  . darbepoetin (ARANESP) injection - NON-DIALYSIS  200 mcg Subcutaneous Q Thu-1800  . feeding supplement (NEPRO CARB STEADY)  237 mL Oral TID WC  . heparin  5,000 Units Subcutaneous Q8H  . insulin pump   Subcutaneous TID AC, HS, 0200  . loratadine  10 mg Oral QHS  . oxyCODONE  10 mg Oral Once  . pravastatin  40 mg Oral Daily  . sodium chloride flush  10-40 mL Intracatheter Q12H  . sodium chloride flush  3 mL Intravenous Q12H  . vancomycin variable dose per unstable renal function (pharmacist dosing)   Does not apply See admin instructions    SUBJECTIVE:  Afebrile with stable white blood cell count of 13.9.  No acute events overnight. She is fine and will be better when she goes home.   Allergies  Allergen Reactions  . Cleocin [Clindamycin Hcl] Diarrhea  . Lisinopril Other (See Comments)    Elevated potassium per pt report  . Amoxicillin Diarrhea, Nausea Only and Other (See Comments)    Has patient had a PCN reaction causing immediate rash, facial/tongue/throat swelling, SOB or lightheadedness with hypotension: No Has patient had a PCN reaction causing severe rash involving mucus membranes or skin necrosis: No Has patient had a PCN reaction that required hospitalization: No Has patient had a PCN reaction occurring within the last 10 years: No If all of the above answers are "NO", then may proceed with  Cephalosporin use.   . Bactrim [Sulfamethoxazole-Trimethoprim] Diarrhea and Nausea Only     Review of Systems: Review of Systems  Constitutional: Negative for chills, fever and weight loss.  Respiratory: Negative for cough, shortness of breath and wheezing.   Cardiovascular: Negative for chest pain and leg swelling.   Gastrointestinal: Negative for abdominal pain, constipation, diarrhea, nausea and vomiting.  Skin: Negative for rash.      OBJECTIVE: Vitals:   08/06/18 1730 08/06/18 1754 08/06/18 2023 08/07/18 0038  BP: (!) 144/71 125/65 127/64 128/61  Pulse: 74 72 78 83  Resp:  16    Temp:  98.2 F (36.8 C) 98.9 F (37.2 C) 99 F (37.2 C)  TempSrc:  Oral    SpO2:  96% 98% 97%  Weight:  55.4 kg    Height:       Body mass index is 18.57 kg/m.  Physical Exam Constitutional:      General: She is not in acute distress.    Appearance: She is well-developed.     Comments: Lying in bed with head of bed elevated; frustrated.  Cardiovascular:     Rate and Rhythm: Normal rate and regular rhythm.     Heart sounds: Normal heart sounds.  Pulmonary:     Effort: Pulmonary effort is normal.     Breath sounds: Normal breath sounds.  Skin:    General: Skin is warm and dry.  Neurological:     Mental Status: She is alert and oriented to person, place, and time.  Psychiatric:     Comments: She has a frustrated mood.     Lab Results Lab Results  Component Value Date   WBC 13.9 (H) 08/06/2018   HGB 8.3 (L) 08/06/2018   HCT 26.5 (L) 08/06/2018   MCV 87.7 08/06/2018   PLT 314 08/06/2018    Lab Results  Component Value Date   CREATININE 3.87 (H) 08/06/2018   BUN 22 (H) 08/06/2018   NA 135 08/06/2018   K 4.0 08/06/2018   CL 94 (L) 08/06/2018   CO2 26 08/06/2018    Lab Results  Component Value Date   ALT 19 07/31/2018   AST 27 07/31/2018   ALKPHOS 198 (H) 07/31/2018   BILITOT 1.8 (H) 07/31/2018     Microbiology: Recent Results (from the past 240 hour(s))  Blood culture (routine x 2)     Status: Abnormal   Collection Time: 07/29/18  3:24 PM  Result Value Ref Range Status   Specimen Description   Final    BLOOD BLOOD RIGHT FOREARM Performed at [facility], [place] [address], [place], [place] [address]    Special Requests   Final    BOTTLES DRAWN AEROBIC AND  ANAEROBIC Blood Culture adequate volume Performed at [facility] [address], [place], [place] [address]    Culture  Setup Time   Final    GRAM POSITIVE COCCI IN BOTH AEROBIC AND ANAEROBIC BOTTLES CRITICAL RESULT CALLED TO, READ BACK BY AND VERIFIED WITH: Alinda Dooms, AT [phone] 08/01/18 BY Rush Landmark Performed at [facility], [place] [address] [place], [place] [address]    Culture METHICILLIN RESISTANT STAPHYLOCOCCUS AUREUS (A)  Final   Report Status 08/04/2018 FINAL  Final   Organism ID, Bacteria METHICILLIN RESISTANT STAPHYLOCOCCUS AUREUS  Final      Susceptibility   Methicillin resistant staphylococcus aureus - MIC*    CIPROFLOXACIN >=8 RESISTANT Resistant     ERYTHROMYCIN >=8 RESISTANT Resistant  GENTAMICIN <=0.5 SENSITIVE Sensitive     OXACILLIN >=4 RESISTANT Resistant     TETRACYCLINE >=16 RESISTANT Resistant     VANCOMYCIN 1 SENSITIVE Sensitive     TRIMETH/SULFA <=10 SENSITIVE Sensitive     CLINDAMYCIN RESISTANT Resistant     RIFAMPIN <=0.5 SENSITIVE Sensitive     Inducible Clindamycin POSITIVE Resistant     * METHICILLIN RESISTANT STAPHYLOCOCCUS AUREUS  Blood Culture ID Panel (Reflexed)     Status: Abnormal   Collection Time: 07/29/18  3:24 PM  Result Value Ref Range Status   Enterococcus species NOT DETECTED NOT DETECTED Final   Listeria monocytogenes NOT DETECTED NOT DETECTED Final   Staphylococcus species DETECTED (A) NOT DETECTED Final    Comment: CRITICAL RESULT CALLED TO, READ BACK BY AND VERIFIED WITH: Derry Lory PHARMD, AT [phone] 08/01/18 BY D. VANHOOK    Staphylococcus aureus (BCID) DETECTED (A) NOT DETECTED Final    Comment: Methicillin (oxacillin)-resistant Staphylococcus aureus (MRSA). MRSA is predictably resistant to beta-lactam antibiotics (except ceftaroline). Preferred therapy is vancomycin unless clinically contraindicated. Patient requires contact precautions if  hospitalized. CRITICAL RESULT CALLED TO, READ BACK BY AND VERIFIED  WITH: Derry Lory PHARMD, AT [phone] 08/01/18 BY D. VANHOOK    Methicillin resistance DETECTED (A) NOT DETECTED Final    Comment: CRITICAL RESULT CALLED TO, READ BACK BY AND VERIFIED WITH: Derry Lory PHARMD, AT [phone] 08/01/18 BY D. VANHOOK    Streptococcus species NOT DETECTED NOT DETECTED Final   Streptococcus agalactiae NOT DETECTED NOT DETECTED Final   Streptococcus pneumoniae NOT DETECTED NOT DETECTED Final   Streptococcus pyogenes NOT DETECTED NOT DETECTED Final   Acinetobacter baumannii NOT DETECTED NOT DETECTED Final   Enterobacteriaceae species NOT DETECTED NOT DETECTED Final   Enterobacter cloacae complex NOT DETECTED NOT DETECTED Final   Escherichia coli NOT DETECTED NOT DETECTED Final   Klebsiella oxytoca NOT DETECTED NOT DETECTED Final   Klebsiella pneumoniae NOT DETECTED NOT DETECTED Final   Proteus species NOT DETECTED NOT DETECTED Final   Serratia marcescens NOT DETECTED NOT DETECTED Final   Haemophilus influenzae NOT DETECTED NOT DETECTED Final   Neisseria meningitidis NOT DETECTED NOT DETECTED Final   Pseudomonas aeruginosa NOT DETECTED NOT DETECTED Final   Candida albicans NOT DETECTED NOT DETECTED Final   Candida glabrata NOT DETECTED NOT DETECTED Final   Candida krusei NOT DETECTED NOT DETECTED Final   Candida parapsilosis NOT DETECTED NOT DETECTED Final   Candida tropicalis NOT DETECTED NOT DETECTED Final    Comment: Performed at [facility], [place] [address], [place], [place] [address]  SARS Coronavirus 2 [facility] order, Performed in [facility] hospital lab)     Status: None   Collection Time: 07/29/18  3:57 PM  Result Value Ref Range Status   SARS Coronavirus 2 NEGATIVE NEGATIVE Final    Comment: (NOTE) If result is NEGATIVE SARS-CoV-2 target nucleic acids are NOT DETECTED. The SARS-CoV-2 RNA is generally detectable in upper and lower  respiratory specimens during the acute phase of infection. The lowest  concentration of SARS-CoV-2 viral copies this assay can  detect is 250  copies / mL. A negative result does not preclude SARS-CoV-2 infection  and should not be used as the sole basis for treatment or other  patient management decisions.  A negative result may occur with  improper specimen collection / handling, submission of specimen other  than nasopharyngeal swab, presence of viral mutation(s) within the  areas targeted by this assay, and inadequate number of viral copies  (<250  copies / mL). A negative result must be combined with clinical  observations, patient history, and epidemiological information. If result is POSITIVE SARS-CoV-2 target nucleic acids are DETECTED. The SARS-CoV-2 RNA is generally detectable in upper and lower  respiratory specimens dur ing the acute phase of infection.  Positive  results are indicative of active infection with SARS-CoV-2.  Clinical  correlation with patient history and other diagnostic information is  necessary to determine patient infection status.  Positive results do  not rule out bacterial infection or co-infection with other viruses. If result is PRESUMPTIVE POSTIVE SARS-CoV-2 nucleic acids MAY BE PRESENT.   A presumptive positive result was obtained on the submitted specimen  and confirmed on repeat testing.  While 2019 novel coronavirus  (SARS-CoV-2) nucleic acids may be present in the submitted sample  additional confirmatory testing may be necessary for epidemiological  and / or clinical management purposes  to differentiate between  SARS-CoV-2 and other Sarbecovirus currently known to infect humans.  If clinically indicated additional testing with an alternate test  methodology [phone]) is advised. The SARS-CoV-2 RNA is generally  detectable in upper and lower respiratory sp ecimens during the acute  phase of infection. The expected result is Negative. Fact Sheet for Patients:  StrictlyIdeas.no Fact Sheet for Healthcare Providers:  BankingDealers.co.za This test is not yet approved or cleared by the [place] FDA and has been authorized for detection and/or diagnosis of SARS-CoV-2 by FDA under an Emergency Use Authorization (EUA).  This EUA will remain in effect (meaning this test can be used) for the duration of the COVID-19 declaration under Section 564(b)(1) of the Act, 21 U.S.C. section 360bbb-3(b)(1), unless the authorization is terminated or revoked sooner. Performed at [facility], [place] [address] [place], [place] [address]   MRSA PCR Screening     Status: Abnormal   Collection Time: 07/30/18  8:28 AM  Result Value Ref Range Status   MRSA by PCR POSITIVE (A) NEGATIVE Final    Comment:        The GeneXpert MRSA Assay (FDA approved for NASAL specimens only), is one component of a comprehensive MRSA colonization surveillance program. It is not intended to diagnose MRSA infection nor to guide or monitor treatment for MRSA infections. RESULT CALLED TO, READ BACK BY AND VERIFIED WITH: A. Lovena Le RN 11:15 07/30/18 ([email or web address]) Performed at [facility], [place] [address], [place], [place] [address]   Respiratory Panel by PCR     Status: None   Collection Time: 07/30/18  8:29 AM  Result Value Ref Range Status   Adenovirus NOT DETECTED NOT DETECTED Final   Coronavirus 229E NOT DETECTED NOT DETECTED Final    Comment: (NOTE) The Coronavirus on the Respiratory Panel, DOES NOT test for the novel  Coronavirus (2019 nCoV)    Coronavirus HKU1 NOT DETECTED NOT DETECTED Final   Coronavirus NL63 NOT DETECTED NOT DETECTED Final   Coronavirus OC43 NOT DETECTED NOT DETECTED Final   Metapneumovirus NOT DETECTED NOT DETECTED Final   Rhinovirus / Enterovirus NOT DETECTED NOT DETECTED Final   Influenza A NOT DETECTED NOT DETECTED Final   Influenza B NOT DETECTED NOT DETECTED Final   Parainfluenza Virus 1 NOT DETECTED NOT DETECTED Final   Parainfluenza Virus 2 NOT DETECTED  NOT DETECTED Final   Parainfluenza Virus 3 NOT DETECTED NOT DETECTED Final   Parainfluenza Virus 4 NOT DETECTED NOT DETECTED Final   Respiratory Syncytial Virus NOT DETECTED NOT DETECTED Final   Bordetella pertussis NOT DETECTED NOT  DETECTED Final   Chlamydophila pneumoniae NOT DETECTED NOT DETECTED Final   Mycoplasma pneumoniae NOT DETECTED NOT DETECTED Final    Comment: Performed at [facility], [place] [address], [place], [place] [address]  Fungus Culture With Stain     Status: None (Preliminary result)   Collection Time: 07/31/18  6:52 PM  Result Value Ref Range Status   Fungus Stain Final report  Final    Comment: (NOTE) Performed At: [facility] [place], [place] [id number] Rush Farmer MD [id number]    Fungus (Mycology) Culture PENDING  Incomplete   Fungal Source ABSCESS  Final    Comment: RIGHT FOOT Performed at [facility], [place] [address], [place], [place] [address]   Aerobic/Anaerobic Culture (surgical/deep wound)     Status: None   Collection Time: 07/31/18  6:52 PM  Result Value Ref Range Status   Specimen Description ABSCESS RIGHT FOOT  Final   Special Requests NONE  Final   Gram Stain   Final    ABUNDANT WBC PRESENT,BOTH PMN AND MONONUCLEAR ABUNDANT GRAM POSITIVE COCCI    Culture   Final    MODERATE METHICILLIN RESISTANT STAPHYLOCOCCUS AUREUS NO ANAEROBES ISOLATED Performed at [facility], [place] [address] [place], [place] [address]    Report Status 08/05/2018 FINAL  Final   Organism ID, Bacteria METHICILLIN RESISTANT STAPHYLOCOCCUS AUREUS  Final      Susceptibility   Methicillin resistant staphylococcus aureus - MIC*    CIPROFLOXACIN >=8 RESISTANT Resistant     ERYTHROMYCIN >=8 RESISTANT Resistant     GENTAMICIN <=0.5 SENSITIVE Sensitive     OXACILLIN >=4 RESISTANT Resistant     TETRACYCLINE >=16 RESISTANT Resistant     VANCOMYCIN 1 SENSITIVE Sensitive     TRIMETH/SULFA <=10 SENSITIVE Sensitive     CLINDAMYCIN  RESISTANT Resistant     RIFAMPIN <=0.5 SENSITIVE Sensitive     Inducible Clindamycin POSITIVE Resistant     * MODERATE METHICILLIN RESISTANT STAPHYLOCOCCUS AUREUS  Fungus Culture Result     Status: None   Collection Time: 07/31/18  6:52 PM  Result Value Ref Range Status   Result 1 Comment  Final    Comment: (NOTE) KOH/Calcofluor preparation:  no fungus observed. Performed At: [facility] [place], [place] [id number] Rush Farmer MD JT:[id number]   Culture, blood (routine x 2)     Status: None   Collection Time: 08/01/18 10:50 AM  Result Value Ref Range Status   Specimen Description BLOOD LEFT HAND  Final   Special Requests   Final    BOTTLES DRAWN AEROBIC AND ANAEROBIC Blood Culture adequate volume   Culture   Final    NO GROWTH 5 DAYS Performed at [facility], [address] [place], [place] [address]    Report Status 08/06/2018 FINAL  Final  Culture, blood (routine x 2)     Status: None   Collection Time: 08/01/18 11:00 AM  Result Value Ref Range Status   Specimen Description BLOOD RIGHT HAND  Final   Special Requests   Final    AEROBIC BOTTLE ONLY Blood Culture results may not be optimal due to an inadequate volume of blood received in culture bottles   Culture   Final    NO GROWTH 5 DAYS Performed at [facility], [place] [address], [place], [place] [address]    Report Status 08/06/2018 FINAL  Final  Aerobic/Anaerobic Culture (surgical/deep wound)     Status: None (  Preliminary result)   Collection Time: 08/04/18  8:19 PM  Result Value Ref Range Status   Specimen Description WOUND RIGHT FOOT  Final   Special Requests NONE  Final   Gram Stain   Final    RARE WBC PRESENT, PREDOMINANTLY MONONUCLEAR FEW GRAM POSITIVE COCCI Performed at [facility], [place] [address], [place], [place] [address]    Culture   Final    MODERATE METHICILLIN RESISTANT STAPHYLOCOCCUS AUREUS NO ANAEROBES ISOLATED; CULTURE IN PROGRESS FOR 5 DAYS     Report Status PENDING  Incomplete   Organism ID, Bacteria METHICILLIN RESISTANT STAPHYLOCOCCUS AUREUS  Final      Susceptibility   Methicillin resistant staphylococcus aureus - MIC*    CIPROFLOXACIN >=8 RESISTANT Resistant     ERYTHROMYCIN >=8 RESISTANT Resistant     GENTAMICIN <=0.5 SENSITIVE Sensitive     OXACILLIN >=4 RESISTANT Resistant     TETRACYCLINE >=16 RESISTANT Resistant     VANCOMYCIN <=0.5 SENSITIVE Sensitive     TRIMETH/SULFA <=10 SENSITIVE Sensitive     CLINDAMYCIN RESISTANT Resistant     RIFAMPIN <=0.5 SENSITIVE Sensitive     Inducible Clindamycin POSITIVE Resistant     * MODERATE METHICILLIN RESISTANT STAPHYLOCOCCUS AUREUS     Terri Piedra, NP [facility] Infectious [facility] [phone] Pager  08/07/2018  11:11 AM

## 2018-08-07 NOTE — Progress Notes (Signed)
Renal Navigator spoke with Clinic Manager at [facility] to discuss transportation difficulties patient has stated and potential barrier for her to get to clinic on Friday to sign paperwork and Saturday for first treatment as is customary for most Saturday starts. Clinic Manager will make exception to accommodate patient and states patient can come early on Saturday to sign paperwork before her first treatment. She needs to arrive to the clinic at 11:30am. Renal Navigator was appreciative. Renal Navigator spoke with OP HD clinic MSW/Tess G to inform of transportation barriers.  Renal Navigator spoke with CSW/N. [place] who has submitted SCAT application for patient. It may take time to process. Renal Navigator called patient to inform her of OP HD schedule and that she needs to be at OP HD clinic at 11:30am on Saturday for paperwork and first treatment. Renal Navigator asked if patient can think of anyone to take her to her first appointment until SCAT can be processed, even though patient informed Renal Navigator earlier in the week that she has "no family and friends." She states her sister can take her to appointment.  Patient is cleared for discharge from OP HD standpoint. OP HD aware of discharge plan.  Alphonzo Cruise Renal Navigator [phone]

## 2018-08-07 NOTE — Progress Notes (Signed)
CSW faxed in [place] application for patient to have transportation to dialysis from home. Patient given follow up instructions.    Nancy Locus Adekunle Rohrbach LCSW [phone]

## 2018-08-07 NOTE — Telephone Encounter (Signed)
Nurse case manager from [facility] called stating that patient has had surgery and is going to be released with a wound vac but she needed some forms filled out in order for the patient to have the wound vac.

## 2018-08-07 NOTE — Progress Notes (Signed)
Inpatient Diabetes Program Recommendations  AACE/ADA: New Consensus Statement on Inpatient Glycemic Control (2015)  Target Ranges:  Prepandial:   less than 140 mg/dL      Peak postprandial:   less than 180 mg/dL (1-2 hours)      Critically ill patients:  140 - 180 mg/dL   Lab Results  Component Value Date   GLUCAP 278 (H) 08/07/2018   HGBA1C 10.4 (H) 07/30/2018    Review of Glycemic Control Results for LACHANDA, BUCZEK" (MRN [id number]) as of 08/07/2018 12:33  Ref. Range 08/07/2018 00:36 08/07/2018 08:09 08/07/2018 12:15  Glucose-Capillary Latest Ref Range: 70 - 99 mg/dL 163 (H) 117 (H) 278 (H)   Diabetes history: Type 1 DM, sees Dr Dwyane Dee Outpatient Diabetes medications: insulin pump Current orders for Inpatient glycemic control: insulin pump  Inpatient Diabetes Program Recommendations:    RN called to inform me that patient would no longer tell her in insulin dosages being bolused per the pump. There is question whether she can see the dosages vs. Forgetting. Discussed plan of care with Dr Loleta Books. Difficulty determining long term plan for patient's glycemic control. Discussed recommendations.   Noted patient's postprandial was 278 mg/dL. Consider adding Novolog 0-5 units custom scale correction starting at 150 mg/dL. Novolog Custom Correction Scale CBG <70 - implement hypoglycemia protocol   70-120- 0 121-150 - 0 151-200 - 1 201-250 - 2 251-300 - 3 301-350 - 4 351-400 - 5 >400- call MD  Spoke with patient regarding role as diabetic coordinator and could help with supplies, insulin pump etc. Confirmed she is typically seen by Dr Dwyane Dee and Leonia Reader. Patient is aware of additional pump supplies to pick up at the office.  Briefly discussed the postprandial value of 278 mg/dL and patient states, "I bolused myself what my pump told me, I don't know how much it is and I am not looking back in my pump." Patient usually corrects instead of boluses with meals. Fears going low.  Plans to set up appointment with Dr Dwyane Dee soon.  Reviewed that my goal was to help patient so that we can work together while inpatient. "I took care of it and I don't need your help." Reviewed some of the goals listed on 3/25 note per Dwyane Dee and [place]. Inquired what patient's goals were. "I don't have any." Reviewed the importance of glycemic control, impact of blood sugars to wounds, vascular changes and long term comorbidites.  When reviewed long term goals for management, patient is not interested. Mentioned useful tools that may serve as reminders for her at home, especially when paired with family support. "I don't need a list or reminding, I have got my pump and I don't want it." Reminded patient of the importance of informing staff of amount bolused. Patient became very argumentative. "I aint doing that." Reviewed that the goal was to help her and this was listed in the insulin pump contract. Suggested the nurse be with her while she was bolusing next time, so that we could see amounts. Patient hung up the phone.   Unfortunately, this situation is quite difficult. Patient is not willing to remove insulin pump and is not willing to report boluses of insulin.  If blood sugars exceed 200-250's mg/dL, difficulty with vision, lack of reporting dosages, and if patient continues inpatient course, I question the use of the insulin pump while inpatient. Will continue to follow trends.   Thanks, Bronson Curb, MSN, RNC-OB Diabetes Coordinator [phone] (8a-5p)

## 2018-08-07 NOTE — Progress Notes (Signed)
[facility] PROGRESS NOTE  Assessment/ Plan: Pt is a 48 y.o. 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 Donnetta Hutching on 08/06/2018.  -Last dialysis yesterday, tolerated well.  Plan for next treatment tomorrow if patient remains in the hospital. -The outpatient dialysis arranged at [facility] SW, TTS schedule.  If patient is being discharged today it is ok for next dialysis on Saturday.  #Anemia of chronic disease: Continue ESA.  No iron because of sepsis.  #MRSA bacteremia and right foot abscess status post I&D on 4/30 and on 08/04/2018.  Plan to continue IV vancomycin for 6 weeks with end date set for 09/12/2018.  We will arrange for IV antibiotics at dialysis.  #Secondary hyperparathyroidism: Calcium is low and phosphorus acceptable.  Continue calcitriol, Tums and calcium acetate.    #Hypertension: On amlodipine.  Blood pressure acceptable.  #Hype 1 diabetes: Per primary care team.  Subjective: Seen and examined at bedside.  Had dialysis catheter exchange and AV fistula created yesterday.  Denies chest pain, shortness of breath, nausea or vomiting.  Objective Vital signs in last 24 hours: Vitals:   08/06/18 1730 08/06/18 1754 08/06/18 2023 08/07/18 0038  BP: (!) 144/71 125/65 127/64 128/61  Pulse: 74 72 78 83  Resp:  16    Temp:  98.2 F (36.8 C) 98.9 F (37.2 C) 99 F (37.2 C)  TempSrc:  Oral    SpO2:  96% 98% 97%  Weight:  55.4 kg    Height:       Weight change:   Intake/Output Summary (Last 24 hours) at 08/07/2018 1312 Last data filed at 08/07/2018 1259 Gross per 24 hour  Intake 583.33 ml  Output 1000 ml  Net -416.67 ml       Labs: Basic Metabolic Panel: Recent Labs  Lab 08/04/18 0416 08/04/18 1826 08/05/18 0412 08/06/18 0432  NA 135 135 134* 135   K 4.1 3.4* 4.2 4.0  CL 94*  --  95* 94*  CO2 25  --  22 26  GLUCOSE 302*  --  324* 88  BUN 33*  --  19 22*  CREATININE 4.25*  --  3.10* 3.87*  CALCIUM 6.3*  --  6.9* 6.8*  PHOS 4.0  --  3.7 4.2   Liver Function Tests: Recent Labs  Lab 08/04/18 0416 08/05/18 0412 08/06/18 0432  ALBUMIN 1.5* 1.5* 1.5*   No results for input(s): LIPASE, AMYLASE in the last 168 hours. No results for input(s): AMMONIA in the last 168 hours. CBC: Recent Labs  Lab 08/01/18 0249 08/03/18 0440 08/04/18 0416 08/04/18 1826 08/06/18 0432  WBC 20.3* 11.5* 11.2*  --  13.9*  NEUTROABS  --   --  7.8*  --   --   HGB 8.6* 8.6* 8.8* 9.5* 8.3*  HCT 25.2* 26.5* 27.0* 28.0* 26.5*  MCV 83.2 85.8 84.1  --  87.7  PLT 516* 384 445*  --  314   Cardiac Enzymes: No results for input(s): CKTOTAL, CKMB, CKMBINDEX, TROPONINI in the last 168 hours. CBG: Recent Labs  Lab 08/06/18 2053 08/06/18 2301 08/07/18 0036 08/07/18 0809 08/07/18 1215  GLUCAP 80 172* 163* 117* 278*    Iron Studies: No results for input(s): IRON, TIBC, TRANSFERRIN, FERRITIN in the last 72 hours. Studies/Results: Dg Chest Port 1 View  Result Date: 08/06/2018 CLINICAL DATA:  Status  post placement of a dialysis catheter 08/01/2018. EXAM: PORTABLE CHEST 1 VIEW COMPARISON:  Fluoroscopic spot view of the upper chest 08/01/2018. Single-view of the chest 07/29/2018. FINDINGS: Right IJ approach double lumen central venous catheter tip projects in the right atrium. Lungs are clear. No pneumothorax. Heart size is normal. No pleural effusion. No focal bony abnormality. IMPRESSION: Dialysis catheter tip projects in the right atrium. Negative for pneumothorax or acute disease. Electronically Signed   By: Inge Rise M.D.   On: 08/06/2018 11:42    Medications: Infusions: . sodium chloride      Scheduled Medications: . amLODipine  5 mg Oral QHS  . calcitRIOL  0.5 mcg Oral Daily  . calcium acetate  1,334 mg Oral TID WC  . Chlorhexidine  Gluconate Cloth  6 each Topical Q0600  . darbepoetin (ARANESP) injection - NON-DIALYSIS  200 mcg Subcutaneous Q Thu-1800  . feeding supplement (NEPRO CARB STEADY)  237 mL Oral TID WC  . heparin  5,000 Units Subcutaneous Q8H  . insulin pump   Subcutaneous TID AC, HS, 0200  . loratadine  10 mg Oral QHS  . oxyCODONE  10 mg Oral Once  . pravastatin  40 mg Oral Daily  . sodium chloride flush  10-40 mL Intracatheter Q12H  . sodium chloride flush  3 mL Intravenous Q12H  . vancomycin variable dose per unstable renal function (pharmacist dosing)   Does not apply See admin instructions    have reviewed scheduled and prn medications.  Physical Exam: General: Not in distress Heart:RRR, s1s2 nl no rubs Lungs: Clear bilateral, no wheezing or crackle Abdomen:soft, Non-tender, non-distended Extremities:No edema Dialysis Access: Right IJ TDC, left AV fistula clean with positive bruit.  Ashtyn Meland Prasad Liberty Seto 08/07/2018,1:12 PM  LOS: 9 days

## 2018-08-08 ENCOUNTER — Telehealth: Payer: Self-pay | Admitting: Vascular Surgery

## 2018-08-08 LAB — CBC
HCT: 26.7 % — ABNORMAL LOW (ref 36.0–46.0)
Hemoglobin: 8.1 g/dL — ABNORMAL LOW (ref 12.0–15.0)
MCH: 27.4 pg (ref 26.0–34.0)
MCHC: 30.3 g/dL (ref 30.0–36.0)
MCV: 90.2 fL (ref 80.0–100.0)
Platelets: 319 10*3/uL (ref 150–400)
RBC: 2.96 MIL/uL — ABNORMAL LOW (ref 3.87–5.11)
RDW: 13.7 % (ref 11.5–15.5)
WBC: 13.9 10*3/uL — ABNORMAL HIGH (ref 4.0–10.5)
nRBC: 0 % (ref 0.0–0.2)

## 2018-08-08 LAB — RENAL FUNCTION PANEL
Albumin: 1.6 g/dL — ABNORMAL LOW (ref 3.5–5.0)
Anion gap: 10 (ref 5–15)
BUN: 17 mg/dL (ref 6–20)
CO2: 25 mmol/L (ref 22–32)
Calcium: 6.7 mg/dL — ABNORMAL LOW (ref 8.9–10.3)
Chloride: 102 mmol/L (ref 98–111)
Creatinine, Ser: 3.81 mg/dL — ABNORMAL HIGH (ref 0.44–1.00)
GFR calc Af Amer: 16 mL/min — ABNORMAL LOW (ref >60)
GFR calc non Af Amer: 13 mL/min — ABNORMAL LOW (ref >60)
Glucose, Bld: 107 mg/dL — ABNORMAL HIGH (ref 70–99)
Phosphorus: 4.6 mg/dL (ref 2.5–4.6)
Potassium: 4.6 mmol/L (ref 3.5–5.1)
Sodium: 137 mmol/L (ref 135–145)

## 2018-08-08 LAB — GLUCOSE, CAPILLARY
Glucose-Capillary: 136 mg/dL — ABNORMAL HIGH (ref 70–99)
Glucose-Capillary: 136 mg/dL — ABNORMAL HIGH (ref 70–99)
Glucose-Capillary: 280 mg/dL — ABNORMAL HIGH (ref 70–99)
Glucose-Capillary: 366 mg/dL — ABNORMAL HIGH (ref 70–99)
Glucose-Capillary: 442 mg/dL — ABNORMAL HIGH (ref 70–99)

## 2018-08-08 MED ORDER — DARBEPOETIN ALFA 200 MCG/0.4ML IJ SOSY
PREFILLED_SYRINGE | INTRAMUSCULAR | Status: AC
  Administered 2018-08-08: 200 ug via INTRAVENOUS
  Filled 2018-08-08: qty 0.4

## 2018-08-08 MED ORDER — HEPARIN SODIUM (PORCINE) 1000 UNIT/ML IJ SOLN
INTRAMUSCULAR | Status: AC
  Administered 2018-08-08: 3400 [IU] via INTRAVENOUS_CENTRAL
  Filled 2018-08-08: qty 4

## 2018-08-08 MED ORDER — HEPARIN SODIUM (PORCINE) 1000 UNIT/ML DIALYSIS
1000.0000 [IU] | INTRAMUSCULAR | Status: DC | PRN
  Filled 2018-08-08: qty 1

## 2018-08-08 MED ORDER — SODIUM CHLORIDE 0.9 % IV SOLN: 100.0000 mL | INTRAVENOUS | Status: DC | PRN

## 2018-08-08 MED ORDER — PENTAFLUOROPROP-TETRAFLUOROETH EX AERO: 1.0000 "application " | INHALATION_SPRAY | CUTANEOUS | Status: DC | PRN

## 2018-08-08 MED ORDER — ALTEPLASE 2 MG IJ SOLR
2.0000 mg | Freq: Once | INTRAMUSCULAR | Status: DC | PRN
  Filled 2018-08-08: qty 2

## 2018-08-08 MED ORDER — HEPARIN SODIUM (PORCINE) 1000 UNIT/ML DIALYSIS
1000.0000 [IU] | Freq: Once | INTRAMUSCULAR | Status: AC
  Administered 2018-08-08: 3400 [IU] via INTRAVENOUS_CENTRAL
  Filled 2018-08-08: qty 1

## 2018-08-08 MED ORDER — LIDOCAINE-PRILOCAINE 2.5-2.5 % EX CREA
1.0000 "application " | TOPICAL_CREAM | CUTANEOUS | Status: DC | PRN
  Filled 2018-08-08: qty 5

## 2018-08-08 MED ORDER — VANCOMYCIN HCL IN DEXTROSE 500-5 MG/100ML-% IV SOLN
500.0000 mg | INTRAVENOUS | Status: AC
  Administered 2018-08-08: 500 mg via INTRAVENOUS

## 2018-08-08 MED ORDER — VANCOMYCIN HCL IN DEXTROSE 500-5 MG/100ML-% IV SOLN: 500.0000 mg | INTRAVENOUS | Status: DC

## 2018-08-08 MED ORDER — CALCIUM ACETATE (PHOS BINDER) 667 MG PO CAPS: 1334.0000 mg | ORAL_CAPSULE | Freq: Three times a day (TID) | ORAL | 0 refills | Status: AC

## 2018-08-08 MED ORDER — LIDOCAINE HCL (PF) 1 % IJ SOLN
5.0000 mL | INTRAMUSCULAR | Status: DC | PRN
  Filled 2018-08-08: qty 5

## 2018-08-08 MED ORDER — VANCOMYCIN HCL IN DEXTROSE 500-5 MG/100ML-% IV SOLN
INTRAVENOUS | Status: AC
  Administered 2018-08-08: 500 mg via INTRAVENOUS
  Filled 2018-08-08: qty 100

## 2018-08-08 MED FILL — CALCIUM ACETATE 667 MG CAP: 667 | 30 days supply | Qty: 180 | Fill #0

## 2018-08-08 NOTE — Telephone Encounter (Signed)
sch appt lvm mld ltr 09/16/2018 9am Dialysis duplex 940am p./o MD

## 2018-08-08 NOTE — Progress Notes (Signed)
PT Cancellation Note  Patient Details Name: [name] MRN: [id number] DOB: 1971-01-29   Cancelled Treatment:    Reason Eval/Treat Not Completed: PT screened, no needs identified, will sign off Pt talking on the phone and PT waited as not to interrupt. Once pt acknowledged PT in room, explained role of PT. Pt became very short with PT stating "I've been up and moving just fine, I don't need PT" and proceeded with her phone call. Will sign off at this time as pt declining PT services. If needs change, please re-consult.   Leighton Ruff, PT, DPT  Acute Rehabilitation Services  Pager: [phone] Office: [phone]  Rudean Hitt 08/08/2018, 12:33 PM

## 2018-08-08 NOTE — Telephone Encounter (Signed)
-----   Message from Gabriel Earing, [place] sent at 08/06/2018 10:21 AM EDT ----- S/p left 1st stage BVT and TDC placement 08/06/2018.  F/u in 6 weeks with duplex with Dr. Donnetta Hutching.  Thanks

## 2018-08-08 NOTE — Progress Notes (Signed)
Pharmacy Antibiotic Note  [name] is a 48 y.o. female admitted on 07/29/2018 with Osteomyelitis/MRSA bacteremia.  Pharmacy has been consulted for vancomycin dosing. Patient is new ESRD and has been initiated on HD this admission. Due to irregular schedule patient has been doses after every HD. Patient has been CLIP'd to a TTS schedule.   Patient with HD today. Refused random vanc level this AM, but will redose patient after HD today as has been tolerating HD.   Plan: Vancomycin 500mg  IV after HD today.  Will follow up the start of the patient on a regular HD schedule to enter maintenance dosing.  If discharged, would continue Vancomycin 500mg  after each HD until end date of 09/12/18   Height: 5\' 8"  (172.7 cm) Weight: 126 lb 5.2 oz (57.3 kg) IBW/kg (Calculated) : 63.9  Temp (24hrs), Avg:98.9 F (37.2 C), Min:98.9 F (37.2 C), Max:98.9 F (37.2 C)  Recent Labs  Lab 08/02/18 0400 08/03/18 0440 08/04/18 0416 08/05/18 0412 08/06/18 0432 08/08/18 0708  WBC  --  11.5* 11.2*  --  13.9* 13.9*  CREATININE 4.31* 3.22* 4.25* 3.10* 3.87* 3.81*  VANCORANDOM 32  --   --   --   --   --     Estimated Creatinine Clearance: 16.7 mL/min (A) (by C-G formula based on SCr of 3.81 mg/dL (H)).    Allergies  Allergen Reactions  . Cleocin [Clindamycin Hcl] Diarrhea  . Lisinopril Other (See Comments)    Elevated potassium per pt report  . Amoxicillin Diarrhea, Nausea Only and Other (See Comments)    Has patient had a PCN reaction causing immediate rash, facial/tongue/throat swelling, SOB or lightheadedness with hypotension: No Has patient had a PCN reaction causing severe rash involving mucus membranes or skin necrosis: No Has patient had a PCN reaction that required hospitalization: No Has patient had a PCN reaction occurring within the last 10 years: No If all of the above answers are "NO", then may proceed with Cephalosporin use.   . Bactrim [Sulfamethoxazole-Trimethoprim] Diarrhea and  Nausea Only    Sincere Berlanga A. Levada Dy, PharmD, [place] Please utilize Amion for appropriate phone number to reach the unit pharmacist ([place])   08/08/2018 8:10 AM

## 2018-08-08 NOTE — TOC Progression Note (Addendum)
Transition of Care [facility]) - Progression Note    Patient Details  Name: [name] MRN: [id number] Date of Birth: 04-06-1970  [facility]) CM/SW Contact  Jacalyn Lefevre Edson Snowball, RN Phone Number: 08/08/2018, 10:39 AM  Clinical Narrative:    [id number] [employer] application signed and wound description completed. [employer] has all documentation needed. Await insurance authorization. Tried to call patient no answer. Also called Nurse.  [place] to Dr [name] at [place] form faxed for signature Fax [phone] . Awaiting return fax  NCM covering today.   Patient for discharge today.   Spoke with Dawn with [employer], she needs home [employer] [facility] paperwork signed and wound measurements and description. Also need HHRN orders and face to face , want to clarify twice weekly dressing changes and amount of suction.....125 Hgmm continuous ? Left a message at Dr [name] office for [place] .   Butch Penny with [place] aware of above.  Expected Discharge Plan: [place] Barriers to Discharge: Continued Medical Work up  Expected Discharge Plan and Services Expected Discharge Plan: [place]         Expected Discharge Date: 08/08/18                                     Social Determinants of Health (SDOH) Interventions    Readmission Risk Interventions No flowsheet data found.

## 2018-08-08 NOTE — TOC Transition Note (Signed)
Transition of Care [facility]) - CM/SW Discharge Note   Patient Details  Name: [name] MRN: [id number] Date of Birth: 01-25-1971  [facility] of [facility]) CM/SW Contact:  Marilu Favre, RN Phone Number: 08/08/2018, 5:00 PM   Clinical Narrative:     Patient active with [facility] , and wants to continue with [facility]. Sarah at [facility] aware patient waiting on [facility] delivery and has assess to [facility] and can pull orders/ information.    Final next level of care: [place] Barriers to Discharge: Continued Medical Work up   Patient Goals and CMS Choice Patient states their goals for this hospitalization and ongoing recovery are:: to go home CMS Medicare.gov Compare Post Acute Care list provided to:: Patient Choice offered to / list presented to : Patient  Discharge Placement                       Discharge Plan and Services                                     Social Determinants of Health (SDOH) Interventions     Readmission Risk Interventions No flowsheet data found.

## 2018-08-08 NOTE — Discharge Summary (Signed)
Physician Discharge Summary  GWENETTA DEVOS LPN:[id number] DOB: June 07, 1970 DOA: 07/29/2018  PCP: Loyola Mast, PA-C  Admit date: 07/29/2018 Discharge date: 08/08/2018  Admitted From: Home  Disposition:  Home   Recommendations for Outpatient Follow-up:  1. Follow up with Podiatry in 1-2 weeks 2. Follow up with ID in 4 weeks 3. Continue Vancomycin with dialysis for 6 weeks, end date 6/12 4. Please obtain Weekly CBC and BMP as well as every other week ESR and CRP with dialysis 5. Follow up with vascular surgery as needed 6. Follow up with Dr. Dwyane Dee as per previous schedule     Home Health: Yes for wound vac  Equipment/Devices: [employer] wound vac, wheelchair  Discharge Condition: Fair  CODE STATUS: FULL Diet recommendation: Diabetic  Brief/Interim Summary: Mrs. Goldberger is a 48 y.o. F with T1DM, previous partial foot amputations, and HTN who presented with right foot swelling from wound clinic, found in the ER to have tachycardia, leukocytosis 20K, and MRI with tenosynovitis extending into the right tarsal joint as well as osteomyelitis of the medial cuneiform and bases of 1st and 2nd metatarsals as well as acute renal failure with Cr 7.0 mg/dL and BUN 101.       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  She was oliguric and experienced no recovery of renal function during her hospital stay.  A brachiobasilic left fistula was placed.    Has been clipped to [facility] in [name].   Sepsis from right foot diabetic foot infection, MRSA bacteremia Presented with tachycardia, leukocytosis and organ failure.  Source right foot.  Amputation was recommended, patient prefers trial medical therapy.  She had 2 serial debridements of the infection, including debridement of bone, and wound vac  placed.  Blood and wound cultures growing MRSA only.   MRSA bacteremia This was noted on 1 of 4 blood cultures.  Likely source right foot. Initial cultures 4/28 -- 1 of 1 +MRSA Repeat cultures 5/1 -- no growth No vascular catheter or implanted devices present at the time of positive blood cultures (HD cath first placed 5/1, at which time cultures were negative) Echo showed no vegetation No back or neck pain, no other metastatic sources suspected. -Will have vancomycin with HD for 6 weeks ending 6/12  Type 1 diabetes with retinopathy  Hypertension    SARS-CoV-2 testing was obtained for screening purposes.  COVID ruled out.           Discharge Instructions  Discharge Instructions    Diet - low sodium heart healthy   Complete by:  As directed    Discharge instructions   Complete by:  As directed    From Dr. Loleta Books: You were admitted for infection of the right foot.  Your blood cultures and both of your cultures obtained during surgery (of the infected tissue) were growing MRSA, a form of staph bacteria.  This is treated with vancomycin.  Because the infection was in bone, you will need to take antibiotics for at least 6 weeks. You will need to follow up with [name] in the next 2 weeks and an infectious disease expert, Terri Piedra, in early June (see date and time in To Do section below) to make sure the infection is getting better.  Please understand, medical and topical treatments alone (like antibiotics and creams and wound vac and careful cleaning  of the wound) may not be successful to treat this infection, and it may be that amputation of the foot is the only treatment that will cure the infection.  If that were the case, the current treatments will not improve the infection, and you will need to re-consider amputation.   In the meantime, careful management of your diabetes is crucial.  Your infection will heal better if your blood sugars stay less than 180  mg/dL. -Continue your insulin regimen as prescribed by Dr. Dwyane Dee -Be strict in avoiding carbohydrates (pastas, breads and any sweets) -If your blood sugar is high, consider adding the following sliding scale:  Novolog Custom Correction Scale CBG <70 - implement hypoglycemia protocol   70-120- 0 121-150 - 0 151-200 - 1 201-250 - 2 251-300 - 3 301-350 - 4 351-400 - 5 >400- call MD    Your dialysis will be on Tuesdays, Thursdays and Saturdays, starting tomorrow 5/9 at 11:45AM.   Increase activity slowly   Complete by:  As directed      Allergies as of 08/08/2018      Reactions   Cleocin [clindamycin Hcl] Diarrhea   Lisinopril Other (See Comments)   Elevated potassium per pt report   Amoxicillin Diarrhea, Nausea Only, Other (See Comments)   Has patient had a PCN reaction causing immediate rash, facial/tongue/throat swelling, SOB or lightheadedness with hypotension: No Has patient had a PCN reaction causing severe rash involving mucus membranes or skin necrosis: No Has patient had a PCN reaction that required hospitalization: No Has patient had a PCN reaction occurring within the last 10 years: No If all of the above answers are "NO", then may proceed with Cephalosporin use.   Bactrim [sulfamethoxazole-trimethoprim] Diarrhea, Nausea Only      Medication List    STOP taking these medications   furosemide 40 MG tablet Commonly known as:  LASIX   loratadine 10 MG tablet Commonly known as:  CLARITIN   norethindrone 5 MG tablet Commonly known as:  AYGESTIN     TAKE these medications   albuterol 108 (90 Base) MCG/ACT inhaler Commonly known as:  VENTOLIN HFA Inhale 2 puffs into the lungs every 6 (six) hours as needed for wheezing or shortness of breath. Reported on 04/15/2015   amLODipine 5 MG tablet Commonly known as:  NORVASC Take 1 tablet (5 mg total) by mouth daily. What changed:  when to take this   aspirin 81 MG tablet Take 81 mg by mouth every evening.   BD Pen  Needle Nano U/F 32G X 4 MM Misc Generic drug:  Insulin Pen Needle USE AS DIRECTED TWICE A DAY   calcitRIOL 0.5 MCG capsule Commonly known as:  ROCALTROL Take 1 capsule daily What changed:    how much to take  how to take this  when to take this  additional instructions   calcium acetate 667 MG capsule Commonly known as:  PHOSLO Take 2 capsules (1,334 mg total) by mouth 3 (three) times daily with meals.   CINNAMON PO Take 1 tablet by mouth every evening.   folic acid 1 MG tablet Commonly known as:  FOLVITE Take 1 mg by mouth every evening.   FreeStyle Libre Reader Devi 1 Device by Does not apply route as directed.   FreeStyle Optician, dispensing Use 1 sensor every 10 days   HumaLOG 100 UNIT/ML injection Generic drug:  insulin lispro CALL MD IF <70, IF 151-200 =2UNITS,201-250 =4 UNITS,251-300 =6 UNITS, 301-350 =8 UNITS,351-400 =10 U What changed:  See the new instructions.   pravastatin 40 MG tablet Commonly known as:  PRAVACHOL TAKE 1 TABLET BY MOUTH EVERY DAY   pyridoxine 100 MG tablet Commonly known as:  B-6 Take 100 mg by mouth daily. Reported on 04/15/2015   TURMERIC PO Take 1 capsule by mouth daily.   vancomycin 500-5 MG/100ML-% IVPB Commonly known as:  VANCOCIN Inject 100 mLs (500 mg total) into the vein every Monday, Wednesday, and Friday with hemodialysis.   Vitamin D (Ergocalciferol) 1.25 MG (50000 UT) Caps capsule Commonly known as:  DRISDOL TAKE ONE CAPSULE BY MOUTH ONE TIME PER WEEK      Follow-up Information    Early, Arvilla Meres, MD In 6 weeks.   Specialties:  Vascular Surgery, Cardiology Why:  Office will call you to arrange your appt (sent) Contact information: [place] [address] [phone]        [place] Circle, FNP Follow up.   Specialties:  Family Medicine, Infectious Diseases Why:  09/09/18 at 9:45am. If you are unable to make this appointment please call our office to reschedule. This may also be an  e-visit.  Contact information: [place] [address] [phone]        [facility] Follow up.   Specialty:  [place] Why:  home health services arranged       Edrick Kins, DPM Follow up.   Specialty:  Podiatry Why:  Call the foot surgeon, [name], to arrange a follow up appointment Contact information: [address] [place] [address] [phone]          Allergies  Allergen Reactions  . Cleocin [Clindamycin Hcl] Diarrhea  . Lisinopril Other (See Comments)    Elevated potassium per pt report  . Amoxicillin Diarrhea, Nausea Only and Other (See Comments)    Has patient had a PCN reaction causing immediate rash, facial/tongue/throat swelling, SOB or lightheadedness with hypotension: No Has patient had a PCN reaction causing severe rash involving mucus membranes or skin necrosis: No Has patient had a PCN reaction that required hospitalization: No Has patient had a PCN reaction occurring within the last 10 years: No If all of the above answers are "NO", then may proceed with Cephalosporin use.   . Bactrim [Sulfamethoxazole-Trimethoprim] Diarrhea and Nausea Only    Consultations:  Infectious disease  Podiatry  Nephrology    Procedures/Studies: US Renal  Result Date: 07/30/2018 CLINICAL DATA:  Initial evaluation for acute renal injury. History of diabetes. EXAM: RENAL / URINARY TRACT ULTRASOUND COMPLETE COMPARISON:  Prior CT from 02/01/2014 FINDINGS: Right Kidney: Renal measurements: 9.0 x 4.4 x 4.6 cm = volume: 94.9 mL. Increased echogenicity within the renal parenchyma. No mass or hydronephrosis visualized. Left Kidney: Renal measurements: 8.3 x 4.5 x 4.7 cm = volume: 93.9 mL. Increased echogenicity within the renal parenchyma. No mass or hydronephrosis visualized. Bladder: Mild circumferential bladder wall thickening, likely related incomplete distension. IMPRESSION: 1. Increased  echogenicity within the renal parenchyma, compatible with medical renal disease. 2. No hydronephrosis. Electronically Signed   By: Jeannine Boga M.D.   On: 07/30/2018 01:37   Mr Ankle Right Wo Contrast  Result Date: 07/29/2018 CLINICAL DATA:  Pain and swelling of the right ankle. Previous amputation of the foot at the level of the bases of the metatarsals. EXAM: MRI OF THE RIGHT ANKLE WITHOUT CONTRAST TECHNIQUE: Multiplanar, multisequence MR imaging of the ankle was performed. No intravenous contrast was administered. COMPARISON:  MRI dated 05/05/2018 and radiographs dated 08/06/2017 FINDINGS: TENDONS Peroneal: There is a tiny amount of fluid in the peroneus longus tendon sheath, unchanged since the prior study. Posteromedial: There is increased fluid in the sheath of the posterior tibialis tendon just distal to the tip of the medial malleolus but this is minimal. Anterior: There is tenosynovitis involving the stumps of the tibialis anterior tendon and extensor hallucis longus tendon. The fluid, likely pus, extends from the stumps of the tendons into the first tarsal metatarsal joint and around the base of the first metatarsal. Achilles: Normal. Plantar Fascia: Normal. LIGAMENTS Lateral: Intact. Medial: Intact. CARTILAGE Ankle Joint: No significant abnormality. Subtalar Joints/Sinus Tarsi: Progressive arthritis of the posterior facets of the subtalar joint. Ligaments of the sinus tarsi are intact. Bones: The patient has developed patchy mottled signal abnormalities in bases of the first and second metatarsal stumps and to a lesser degree in the bases of the third, fourth and fifth metatarsal stumps. There is also new patchy signal abnormality in the medial and middle cuneiforms as well as in the navicular and to a lesser degree in the lateral cuneiform and cuboid. Other: Diffuse subcutaneous edema around the ankle and in the foot. IMPRESSION: 1. Findings consistent with infectious tenosynovitis of the  anterior tibialis tendon and extensor hallucis longus tendon with pus extending into the first tarsal metatarsal joint. Probable osteomyelitis involving the medial cuneiform and bases of the first and second metatarsals. I suspect there is less severe osteomyelitis involving the other metatarsal bases, the middle and lateral cuneiforms, and the navicular and possibly the cuboid. Electronically Signed   By: Lorriane Shire M.D.   On: 07/29/2018 20:27   Ir Fluoro Guide Cv Line Right  Result Date: 08/01/2018 INDICATION: Chronic kidney disease and needing dialysis. EXAM: FLUOROSCOPIC AND ULTRASOUND GUIDED PLACEMENT OF A NON-TUNNELED DIALYSIS CATHETER Physician: Stephan Minister. Henn, MD MEDICATIONS: None ANESTHESIA/SEDATION: None FLUOROSCOPY TIME:  Fluoroscopy Time: 6 seconds, 0.3 mGy COMPLICATIONS: None immediate. PROCEDURE: The procedure was explained to the patient. The risks and benefits of the procedure were discussed and the patient's questions were addressed. Informed consent was obtained from the patient. The patient was placed supine on the interventional table. Ultrasound confirmed a patent right internal jugular vein. Ultrasound images were obtained for documentation. The right side of the neck was prepped and draped in a sterile fashion. The right neck was anesthetized with 1% lidocaine. Maximal barrier sterile technique was utilized including caps, mask, sterile gowns, sterile gloves, sterile drape, hand hygiene and skin antiseptic. A small incision was made with #11 blade scalpel. A 21 gauge needle directed into the right internal jugular vein with ultrasound guidance. A micropuncture dilator set was placed. A 16 cm Mahurkar catheter was selected. The catheter was advanced over a wire and positioned at the superior cavoatrial junction. Fluoroscopic images were obtained for documentation. Both dialysis lumens were found to aspirate and flush well. The proper amount of heparin was flushed in both lumens. The  central venous lumen was flushed with normal saline. Catheter was sutured to skin. FINDINGS: Catheter tip at the superior cavoatrial junction. IMPRESSION: Successful placement of a right jugular non-tunneled dialysis catheter using ultrasound and fluoroscopic guidance. Electronically Signed   By: Markus Daft M.D.   On: 08/01/2018 17:22   Ir US Guide Vasc Access Right  Result Date: 08/01/2018 INDICATION: Chronic kidney disease and needing dialysis. EXAM: FLUOROSCOPIC AND ULTRASOUND GUIDED PLACEMENT OF A NON-TUNNELED DIALYSIS CATHETER Physician: Stephan Minister. Anselm Pancoast, MD MEDICATIONS: None ANESTHESIA/SEDATION: None FLUOROSCOPY  TIME:  Fluoroscopy Time: 6 seconds, 0.3 mGy COMPLICATIONS: None immediate. PROCEDURE: The procedure was explained to the patient. The risks and benefits of the procedure were discussed and the patient's questions were addressed. Informed consent was obtained from the patient. The patient was placed supine on the interventional table. Ultrasound confirmed a patent right internal jugular vein. Ultrasound images were obtained for documentation. The right side of the neck was prepped and draped in a sterile fashion. The right neck was anesthetized with 1% lidocaine. Maximal barrier sterile technique was utilized including caps, mask, sterile gowns, sterile gloves, sterile drape, hand hygiene and skin antiseptic. A small incision was made with #11 blade scalpel. A 21 gauge needle directed into the right internal jugular vein with ultrasound guidance. A micropuncture dilator set was placed. A 16 cm Mahurkar catheter was selected. The catheter was advanced over a wire and positioned at the superior cavoatrial junction. Fluoroscopic images were obtained for documentation. Both dialysis lumens were found to aspirate and flush well. The proper amount of heparin was flushed in both lumens. The central venous lumen was flushed with normal saline. Catheter was sutured to skin. FINDINGS: Catheter tip at the superior  cavoatrial junction. IMPRESSION: Successful placement of a right jugular non-tunneled dialysis catheter using ultrasound and fluoroscopic guidance. Electronically Signed   By: Markus Daft M.D.   On: 08/01/2018 17:22   Dg Chest Port 1 View  Result Date: 08/06/2018 CLINICAL DATA:  Status post placement of a dialysis catheter 08/01/2018. EXAM: PORTABLE CHEST 1 VIEW COMPARISON:  Fluoroscopic spot view of the upper chest 08/01/2018. Single-view of the chest 07/29/2018. FINDINGS: Right IJ approach double lumen central venous catheter tip projects in the right atrium. Lungs are clear. No pneumothorax. Heart size is normal. No pleural effusion. No focal bony abnormality. IMPRESSION: Dialysis catheter tip projects in the right atrium. Negative for pneumothorax or acute disease. Electronically Signed   By: Inge Rise M.D.   On: 08/06/2018 11:42   Dg Chest Portable 1 View  Result Date: 07/29/2018 CLINICAL DATA:  Fever for 5 days, nonproductive cough, diarrhea, nausea EXAM: PORTABLE CHEST 1 VIEW COMPARISON:  Portable exam 1518 hours compared to 05/04/2017 FINDINGS: Normal heart size, mediastinal contours, and pulmonary vascularity. Peribronchial thickening, new. No acute infiltrate, pleural effusion, or pneumothorax. Bones appear demineralized. IMPRESSION: Bronchitic changes without infiltrate. Electronically Signed   By: Lavonia Dana M.D.   On: 07/29/2018 16:15   Dg Foot Complete Right  Result Date: 07/30/2018 CLINICAL DATA:  Osteomyelitis.  Right foot infection. EXAM: RIGHT FOOT COMPLETE - 3+ VIEW COMPARISON:  None. FINDINGS: The patient has had forefoot amputation at the level of the bases of the metatarsals. There is a small area of the erosion of the cortex of stump of the fifth metatarsal seen only on the lateral view. There is soft tissue swelling of the stump and on the dorsum of the foot and anterior aspect of the ankle. No fractures. IMPRESSION: Possible focal osteomyelitis of the stump of the fifth  metatarsal. Soft tissue swelling. Electronically Signed   By: Lorriane Shire M.D.   On: 07/30/2018 08:47   Vas [place] Lower Extremity Venous (dvt)  Result Date: 07/23/2018  Lower Venous Study Indications: Edema, Swelling, redness, and ulceration.  Performing Technologist: Delorise Shiner RVT  Examination Guidelines: A complete evaluation includes B-mode imaging, spectral Doppler, color Doppler, and power Doppler as needed of all accessible portions of each vessel. Bilateral testing is considered an integral part of a complete examination. Limited examinations for reoccurring  indications may be performed as noted.  +---------+---------------+---------+-----------+----------+-------+ RIGHT    CompressibilityPhasicitySpontaneityPropertiesSummary +---------+---------------+---------+-----------+----------+-------+ CFV      Full           Yes      Yes                          +---------+---------------+---------+-----------+----------+-------+ SFJ      Full                    Yes                          +---------+---------------+---------+-----------+----------+-------+ FV Prox  Full                    Yes                          +---------+---------------+---------+-----------+----------+-------+ FV Mid   Full                    Yes                          +---------+---------------+---------+-----------+----------+-------+ FV DistalFull                    Yes                          +---------+---------------+---------+-----------+----------+-------+ PFV      Full                    Yes                          +---------+---------------+---------+-----------+----------+-------+ POP      Full                    Yes                          +---------+---------------+---------+-----------+----------+-------+ PTV      Full                                                 +---------+---------------+---------+-----------+----------+-------+ PERO     Full                                                  +---------+---------------+---------+-----------+----------+-------+ GSV      Full                                                 +---------+---------------+---------+-----------+----------+-------+ SSV      Full                                                 +---------+---------------+---------+-----------+----------+-------+ Enlarged, irregular , vascularized  lymph nodes seen at the inguinal crease.  +----+---------------+---------+-----------+----------+-------+ LEFTCompressibilityPhasicitySpontaneityPropertiesSummary +----+---------------+---------+-----------+----------+-------+ CFV Full           Yes                                   +----+---------------+---------+-----------+----------+-------+     Summary: Right: There is no evidence of deep vein thrombosis in the lower extremity.There is no evidence of superficial venous thrombosis. No cystic structure found in the popliteal fossa. Left: No evidence of common femoral vein obstruction.  *See table(s) above for measurements and observations. Electronically signed by Deitra Mayo MD on 07/23/2018 at 3:57:25 PM.    Final    Vas [place] Upper Ext Vein Mapping (pre-op Avf)  Result Date: 08/02/2018 UPPER EXTREMITY VEIN MAPPING  Indications: Pre-access. Performing Technologist: Toma Copier RVS  Examination Guidelines: A complete evaluation includes B-mode imaging, spectral Doppler, color Doppler, and power Doppler as needed of all accessible portions of each vessel. Bilateral testing is considered an integral part of a complete examination. Limited examinations for reoccurring indications may be performed as noted. +-----------------+-------------+----------+------------------------------+ Right Cephalic   Diameter (cm)Depth (cm)           Findings            +-----------------+-------------+----------+------------------------------+ Shoulder             0.19         0.46                                  +-----------------+-------------+----------+------------------------------+ Prox upper arm       0.21        0.54                                  +-----------------+-------------+----------+------------------------------+ Mid upper arm        0.26        0.58                                  +-----------------+-------------+----------+------------------------------+ Dist upper arm       0.17        0.35                                  +-----------------+-------------+----------+------------------------------+ Antecubital fossa    0.17        0.32                                  +-----------------+-------------+----------+------------------------------+ Prox forearm         0.25        0.32                                  +-----------------+-------------+----------+------------------------------+ Mid forearm                             branching and Chronic thrombus +-----------------+-------------+----------+------------------------------+ Dist forearm  not visualized         +-----------------+-------------+----------+------------------------------+ Wrist                                           not visualized         +-----------------+-------------+----------+------------------------------+ +-----------------+-------------+----------+---------+ Right Basilic    Diameter (cm)Depth (cm)Findings  +-----------------+-------------+----------+---------+ Mid upper arm        0.54        0.73             +-----------------+-------------+----------+---------+ Dist upper arm       0.26        0.53             +-----------------+-------------+----------+---------+ Antecubital fossa    0.15        1.05             +-----------------+-------------+----------+---------+ Prox forearm         0.11        0.63   branching +-----------------+-------------+----------+---------+  +-----------------+-------------+----------+--------------+ Left Cephalic    Diameter (cm)Depth (cm)   Findings    +-----------------+-------------+----------+--------------+ Shoulder             0.23        0.58                  +-----------------+-------------+----------+--------------+ Prox upper arm       0.17        0.52                  +-----------------+-------------+----------+--------------+ Mid upper arm        0.21        0.65                  +-----------------+-------------+----------+--------------+ Dist upper arm       0.21        0.44                  +-----------------+-------------+----------+--------------+ Antecubital fossa                       not visualized +-----------------+-------------+----------+--------------+ Prox forearm                            not visualized +-----------------+-------------+----------+--------------+ Mid forearm                             not visualized +-----------------+-------------+----------+--------------+ Dist forearm                            not visualized +-----------------+-------------+----------+--------------+ Wrist                                   not visualized +-----------------+-------------+----------+--------------+ +-----------------+------------+---------+------------------------------------+ Left Basilic       Diameter    Depth                Findings                                    (cm)      (cm)                                        +-----------------+------------+---------+------------------------------------+  Mid upper arm        0.21      0.53                                        +-----------------+------------+---------+------------------------------------+ Dist upper arm       0.15      0.49                                        +-----------------+------------+---------+------------------------------------+ Antecubital fossa                                 not visualized            +-----------------+------------+---------+------------------------------------+ Prox forearm                               not visualized and IV site                                                        dressinging              +-----------------+------------+---------+------------------------------------+ Mid forearm                           not visualized and IV site dressing  +-----------------+------------+---------+------------------------------------+ Distal forearm                                   not visualized            +-----------------+------------+---------+------------------------------------+ Elbow                                            not visualized            +-----------------+------------+---------+------------------------------------+ Wrist                                            not visualized            +-----------------+------------+---------+------------------------------------+ *See table(s) above for measurements and observations.  Diagnosing physician: Curt Jews MD Electronically signed by Curt Jews MD on 08/02/2018 at 1:52:26 PM.    Final        Subjective: Irritated to be in the hospital, but no new complaints.  No fever, no vomiting.  No respiratory distress.  Discharge Exam: Vitals:   08/08/18 1100 08/08/18 1549  BP: 131/83 (!) 145/75  Pulse: 88 89  Resp: 17 17  Temp: 98.7 F (37.1 C) 98.9 F (37.2 C)  SpO2: 98% 100%   Vitals:   08/08/18 1000 08/08/18 1030 08/08/18 1100 08/08/18 1549  BP: (!) 159/75 134/81 131/83 (!) 145/75  Pulse: 79 86 88 89  Resp:   17 17  Temp:   98.7  F (37.1 C) 98.9 F (37.2 C)  TempSrc:   Oral Oral  SpO2:   98% 100%  Weight:   53.6 kg   Height:        General: Pt is alert, awake, not in acute distress, lying in bed, interactive Cardiovascular: RRR, nl S1-S2, no murmurs appreciated.   No LE edema.   Respiratory: Normal respiratory rate and rhythm.  CTAB  without rales or wheezes. Abdominal: Abdomen soft and non-tender.  No distension or HSM.   Neuro/Psych: Moves upper and lower extremities with normal strength and coordination.  Judgment and insight appear normal.   The results of significant diagnostics from this hospitalization (including imaging, microbiology, ancillary and laboratory) are listed below for reference.     Microbiology: Recent Results (from the past 240 hour(s))  MRSA PCR Screening     Status: Abnormal   Collection Time: 07/30/18  8:28 AM  Result Value Ref Range Status   MRSA by PCR POSITIVE (A) NEGATIVE Final    Comment:        The GeneXpert MRSA Assay (FDA approved for NASAL specimens only), is one component of a comprehensive MRSA colonization surveillance program. It is not intended to diagnose MRSA infection nor to guide or monitor treatment for MRSA infections. RESULT CALLED TO, READ BACK BY AND VERIFIED WITH: A. Lovena Le RN 11:15 07/30/18 ([email or web address]) Performed at [facility], [place] [address], [place], [place] [address]   Respiratory Panel by PCR     Status: None   Collection Time: 07/30/18  8:29 AM  Result Value Ref Range Status   Adenovirus NOT DETECTED NOT DETECTED Final   Coronavirus 229E NOT DETECTED NOT DETECTED Final    Comment: (NOTE) The Coronavirus on the Respiratory Panel, DOES NOT test for the novel  Coronavirus (2019 nCoV)    Coronavirus HKU1 NOT DETECTED NOT DETECTED Final   Coronavirus NL63 NOT DETECTED NOT DETECTED Final   Coronavirus OC43 NOT DETECTED NOT DETECTED Final   Metapneumovirus NOT DETECTED NOT DETECTED Final   Rhinovirus / Enterovirus NOT DETECTED NOT DETECTED Final   Influenza A NOT DETECTED NOT DETECTED Final   Influenza B NOT DETECTED NOT DETECTED Final   Parainfluenza Virus 1 NOT DETECTED NOT DETECTED Final   Parainfluenza Virus 2 NOT DETECTED NOT DETECTED Final   Parainfluenza Virus 3 NOT DETECTED NOT DETECTED Final   Parainfluenza Virus 4 NOT DETECTED NOT  DETECTED Final   Respiratory Syncytial Virus NOT DETECTED NOT DETECTED Final   Bordetella pertussis NOT DETECTED NOT DETECTED Final   Chlamydophila pneumoniae NOT DETECTED NOT DETECTED Final   Mycoplasma pneumoniae NOT DETECTED NOT DETECTED Final    Comment: Performed at [facility], [place]. [address], [place], [place] [address]  Fungus Culture With Stain     Status: None (Preliminary result)   Collection Time: 07/31/18  6:52 PM  Result Value Ref Range Status   Fungus Stain Final report  Final    Comment: (NOTE) Performed At: [facility] [place], [place] [id number] Rush Farmer MD SH:[id number]    Fungus (Mycology) Culture PENDING  Incomplete   Fungal Source ABSCESS  Final    Comment: RIGHT FOOT Performed at [facility], [place] [address], [place], [place] [address]   Aerobic/Anaerobic Culture (surgical/deep wound)     Status: None   Collection Time: 07/31/18  6:52 PM  Result Value Ref Range Status   Specimen Description ABSCESS RIGHT FOOT  Final   Special Requests NONE  Final   Gram Stain   Final    ABUNDANT WBC PRESENT,BOTH PMN AND MONONUCLEAR ABUNDANT GRAM POSITIVE COCCI    Culture   Final    MODERATE METHICILLIN RESISTANT STAPHYLOCOCCUS AUREUS NO ANAEROBES ISOLATED Performed at [facility], [address] [place], [place] [address]    Report Status 08/05/2018 FINAL  Final   Organism ID, Bacteria METHICILLIN RESISTANT STAPHYLOCOCCUS AUREUS  Final      Susceptibility   Methicillin resistant staphylococcus aureus - MIC*    CIPROFLOXACIN >=8 RESISTANT Resistant     ERYTHROMYCIN >=8 RESISTANT Resistant     GENTAMICIN <=0.5 SENSITIVE Sensitive     OXACILLIN >=4 RESISTANT Resistant     TETRACYCLINE >=16 RESISTANT Resistant     VANCOMYCIN 1 SENSITIVE Sensitive     TRIMETH/SULFA <=10 SENSITIVE Sensitive     CLINDAMYCIN RESISTANT Resistant     RIFAMPIN <=0.5 SENSITIVE Sensitive     Inducible Clindamycin POSITIVE Resistant     *  MODERATE METHICILLIN RESISTANT STAPHYLOCOCCUS AUREUS  Fungus Culture Result     Status: None   Collection Time: 07/31/18  6:52 PM  Result Value Ref Range Status   Result 1 Comment  Final    Comment: (NOTE) KOH/Calcofluor preparation:  no fungus observed. Performed At: [facility] [place], [place] [id number] Rush Farmer MD ZD:[id number]   Culture, blood (routine x 2)     Status: None   Collection Time: 08/01/18 10:50 AM  Result Value Ref Range Status   Specimen Description BLOOD LEFT HAND  Final   Special Requests   Final    BOTTLES DRAWN AEROBIC AND ANAEROBIC Blood Culture adequate volume   Culture   Final    NO GROWTH 5 DAYS Performed at [facility], [address] [place], [place] [address]    Report Status 08/06/2018 FINAL  Final  Culture, blood (routine x 2)     Status: None   Collection Time: 08/01/18 11:00 AM  Result Value Ref Range Status   Specimen Description BLOOD RIGHT HAND  Final   Special Requests   Final    AEROBIC BOTTLE ONLY Blood Culture results may not be optimal due to an inadequate volume of blood received in culture bottles   Culture   Final    NO GROWTH 5 DAYS Performed at [facility], [place] [address] [place], [place] [address]    Report Status 08/06/2018 FINAL  Final  Aerobic/Anaerobic Culture (surgical/deep wound)     Status: None (Preliminary result)   Collection Time: 08/04/18  8:19 PM  Result Value Ref Range Status   Specimen Description WOUND RIGHT FOOT  Final   Special Requests NONE  Final   Gram Stain   Final    RARE WBC PRESENT, PREDOMINANTLY MONONUCLEAR FEW GRAM POSITIVE COCCI Performed at [facility], [place] [address], [place],  [address]    Culture   Final    MODERATE METHICILLIN RESISTANT STAPHYLOCOCCUS AUREUS NO ANAEROBES ISOLATED; CULTURE IN PROGRESS FOR 5 DAYS    Report Status PENDING  Incomplete   Organism ID, Bacteria METHICILLIN RESISTANT STAPHYLOCOCCUS AUREUS  Final       Susceptibility   Methicillin resistant staphylococcus aureus - MIC*    CIPROFLOXACIN >=8 RESISTANT Resistant     ERYTHROMYCIN >=8 RESISTANT Resistant     GENTAMICIN <=0.5 SENSITIVE Sensitive     OXACILLIN >=4 RESISTANT Resistant     TETRACYCLINE >=16 RESISTANT Resistant     VANCOMYCIN <=0.5 SENSITIVE  Sensitive     TRIMETH/SULFA <=10 SENSITIVE Sensitive     CLINDAMYCIN RESISTANT Resistant     RIFAMPIN <=0.5 SENSITIVE Sensitive     Inducible Clindamycin POSITIVE Resistant     * MODERATE METHICILLIN RESISTANT STAPHYLOCOCCUS AUREUS     Labs: BNP (last 3 results) No results for input(s): BNP in the last 8760 hours. Basic Metabolic Panel: Recent Labs  Lab 08/03/18 0440 08/04/18 0416 08/04/18 1826 08/05/18 0412 08/06/18 0432 08/08/18 0708  NA 138 135 135 134* 135 137  K 3.9 4.1 3.4* 4.2 4.0 4.6  CL 100 94*  --  95* 94* 102  CO2 25 25  --  _0 GLUCOSE 355* 302*  --  324* 88 107*  BUN 22* 33*  --  19 22* 17  CREATININE 3.22* 4.25*  --  3.10* 3.87* 3.81*  CALCIUM 6.5* 6.3*  --  6.9* 6.8* 6.7*  PHOS 4.4 4.0  --  3.7 4.2 4.6   Liver Function Tests: Recent Labs  Lab 08/03/18 0440 08/04/18 0416 08/05/18 0412 08/06/18 0432 08/08/18 0708  ALBUMIN 1.4* 1.5* 1.5* 1.5* 1.6*   No results for input(s): LIPASE, AMYLASE in the last 168 hours. No results for input(s): AMMONIA in the last 168 hours. CBC: Recent Labs  Lab 08/03/18 0440 08/04/18 0416 08/04/18 1826 08/06/18 0432 08/08/18 0708  WBC 11.5* 11.2*  --  13.9* 13.9*  NEUTROABS  --  7.8*  --   --   --   HGB 8.6* 8.8* 9.5* 8.3* 8.1*  HCT 26.5* 27.0* 28.0* 26.5* 26.7*  MCV 85.8 84.1  --  87.7 90.2  PLT 384 445*  --  314 319   Cardiac Enzymes: No results for input(s): CKTOTAL, CKMB, CKMBINDEX, TROPONINI in the last 168 hours. BNP: Invalid input(s): POCBNP CBG: Recent Labs  Lab 08/07/18 1721 08/07/18 2005 08/08/18 0049 08/08/18 1222 08/08/18 1551  GLUCAP 110* 160* 136* 136* 280*   D-Dimer No results for  input(s): DDIMER in the last 72 hours. Hgb A1c No results for input(s): HGBA1C in the last 72 hours. Lipid Profile No results for input(s): CHOL, HDL, LDLCALC, TRIG, CHOLHDL, LDLDIRECT in the last 72 hours. Thyroid function studies No results for input(s): TSH, T4TOTAL, T3FREE, THYROIDAB in the last 72 hours.  Invalid input(s): FREET3 Anemia work up No results for input(s): VITAMINB12, FOLATE, FERRITIN, TIBC, IRON, RETICCTPCT in the last 72 hours. Urinalysis    Component Value Date/Time   COLORURINE YELLOW 07/29/2018 1524   APPEARANCEUR HAZY (A) 07/29/2018 1524   LABSPEC 1.010 07/29/2018 1524   PHURINE 5.0 07/29/2018 1524   GLUCOSEU >=500 (A) 07/29/2018 1524   GLUCOSEU 500 (A) 01/26/2015 1200   HGBUR SMALL (A) 07/29/2018 1524   BILIRUBINUR NEGATIVE 07/29/2018 1524   KETONESUR NEGATIVE 07/29/2018 1524   PROTEINUR >=300 (A) 07/29/2018 1524   UROBILINOGEN 0.2 01/26/2015 1200   NITRITE POSITIVE (A) 07/29/2018 1524   LEUKOCYTESUR SMALL (A) 07/29/2018 1524   Sepsis Labs Invalid input(s): PROCALCITONIN,  WBC,  LACTICIDVEN Microbiology Recent Results (from the past 240 hour(s))  MRSA PCR Screening     Status: Abnormal   Collection Time: 07/30/18  8:28 AM  Result Value Ref Range Status   MRSA by PCR POSITIVE (A) NEGATIVE Final    Comment:        The GeneXpert MRSA Assay (FDA approved for NASAL specimens only), is one component of a comprehensive MRSA colonization surveillance program. It is not intended to diagnose MRSA infection nor to guide or monitor treatment for  MRSA infections. RESULT CALLED TO, READ BACK BY AND VERIFIED WITH: A. Lovena Le RN 11:15 07/30/18 ([email or web address]) Performed at [facility], [place] [address] [place], [place] [address]   Respiratory Panel by PCR     Status: None   Collection Time: 07/30/18  8:29 AM  Result Value Ref Range Status   Adenovirus NOT DETECTED NOT DETECTED Final   Coronavirus 229E NOT DETECTED NOT DETECTED Final    Comment: (NOTE) The  Coronavirus on the Respiratory Panel, DOES NOT test for the novel  Coronavirus (2019 nCoV)    Coronavirus HKU1 NOT DETECTED NOT DETECTED Final   Coronavirus NL63 NOT DETECTED NOT DETECTED Final   Coronavirus OC43 NOT DETECTED NOT DETECTED Final   Metapneumovirus NOT DETECTED NOT DETECTED Final   Rhinovirus / Enterovirus NOT DETECTED NOT DETECTED Final   Influenza A NOT DETECTED NOT DETECTED Final   Influenza B NOT DETECTED NOT DETECTED Final   Parainfluenza Virus 1 NOT DETECTED NOT DETECTED Final   Parainfluenza Virus 2 NOT DETECTED NOT DETECTED Final   Parainfluenza Virus 3 NOT DETECTED NOT DETECTED Final   Parainfluenza Virus 4 NOT DETECTED NOT DETECTED Final   Respiratory Syncytial Virus NOT DETECTED NOT DETECTED Final   Bordetella pertussis NOT DETECTED NOT DETECTED Final   Chlamydophila pneumoniae NOT DETECTED NOT DETECTED Final   Mycoplasma pneumoniae NOT DETECTED NOT DETECTED Final    Comment: Performed at [facility], [place]. [address], [place], [place] [address]  Fungus Culture With Stain     Status: None (Preliminary result)   Collection Time: 07/31/18  6:52 PM  Result Value Ref Range Status   Fungus Stain Final report  Final    Comment: (NOTE) Performed At: [facility] [place], [place] [id number] Rush Farmer MD [id number]    Fungus (Mycology) Culture PENDING  Incomplete   Fungal Source ABSCESS  Final    Comment: RIGHT FOOT Performed at [facility], [place] [address], [place], [place] [address]   Aerobic/Anaerobic Culture (surgical/deep wound)     Status: None   Collection Time: 07/31/18  6:52 PM  Result Value Ref Range Status   Specimen Description ABSCESS RIGHT FOOT  Final   Special Requests NONE  Final   Gram Stain   Final    ABUNDANT WBC PRESENT,BOTH PMN AND MONONUCLEAR ABUNDANT GRAM POSITIVE COCCI    Culture   Final    MODERATE METHICILLIN RESISTANT STAPHYLOCOCCUS AUREUS NO ANAEROBES ISOLATED Performed at [facility], [place] [address], [place], [place] [address]    Report Status 08/05/2018 FINAL  Final   Organism ID, Bacteria METHICILLIN RESISTANT STAPHYLOCOCCUS AUREUS  Final      Susceptibility   Methicillin resistant staphylococcus aureus - MIC*    CIPROFLOXACIN >=8 RESISTANT Resistant     ERYTHROMYCIN >=8 RESISTANT Resistant     GENTAMICIN <=0.5 SENSITIVE Sensitive     OXACILLIN >=4 RESISTANT Resistant     TETRACYCLINE >=16 RESISTANT Resistant     VANCOMYCIN 1 SENSITIVE Sensitive     TRIMETH/SULFA <=10 SENSITIVE Sensitive     CLINDAMYCIN RESISTANT Resistant     RIFAMPIN <=0.5 SENSITIVE Sensitive     Inducible Clindamycin POSITIVE Resistant     * MODERATE METHICILLIN RESISTANT STAPHYLOCOCCUS AUREUS  Fungus Culture Result     Status: None   Collection Time: 07/31/18  6:52 PM  Result Value Ref Range Status   Result 1 Comment  Final    Comment: (NOTE) KOH/Calcofluor preparation:  no fungus  observed. Performed At: [facility] [place], [place] [id number] Rush Farmer MD BK:[id number]   Culture, blood (routine x 2)     Status: None   Collection Time: 08/01/18 10:50 AM  Result Value Ref Range Status   Specimen Description BLOOD LEFT HAND  Final   Special Requests   Final    BOTTLES DRAWN AEROBIC AND ANAEROBIC Blood Culture adequate volume   Culture   Final    NO GROWTH 5 DAYS Performed at [facility], [address], [place], [place] [address]    Report Status 08/06/2018 FINAL  Final  Culture, blood (routine x 2)     Status: None   Collection Time: 08/01/18 11:00 AM  Result Value Ref Range Status   Specimen Description BLOOD RIGHT HAND  Final   Special Requests   Final    AEROBIC BOTTLE ONLY Blood Culture results may not be optimal due to an inadequate volume of blood received in culture bottles   Culture   Final    NO GROWTH 5 DAYS Performed at [facility], [place] [address], [place], [place] [address]    Report Status 08/06/2018 FINAL  Final   Aerobic/Anaerobic Culture (surgical/deep wound)     Status: None (Preliminary result)   Collection Time: 08/04/18  8:19 PM  Result Value Ref Range Status   Specimen Description WOUND RIGHT FOOT  Final   Special Requests NONE  Final   Gram Stain   Final    RARE WBC PRESENT, PREDOMINANTLY MONONUCLEAR FEW GRAM POSITIVE COCCI Performed at [facility], [place] [address], [place], [place] [address]    Culture   Final    MODERATE METHICILLIN RESISTANT STAPHYLOCOCCUS AUREUS NO ANAEROBES ISOLATED; CULTURE IN PROGRESS FOR 5 DAYS    Report Status PENDING  Incomplete   Organism ID, Bacteria METHICILLIN RESISTANT STAPHYLOCOCCUS AUREUS  Final      Susceptibility   Methicillin resistant staphylococcus aureus - MIC*    CIPROFLOXACIN >=8 RESISTANT Resistant     ERYTHROMYCIN >=8 RESISTANT Resistant     GENTAMICIN <=0.5 SENSITIVE Sensitive     OXACILLIN >=4 RESISTANT Resistant     TETRACYCLINE >=16 RESISTANT Resistant     VANCOMYCIN <=0.5 SENSITIVE Sensitive     TRIMETH/SULFA <=10 SENSITIVE Sensitive     CLINDAMYCIN RESISTANT Resistant     RIFAMPIN <=0.5 SENSITIVE Sensitive     Inducible Clindamycin POSITIVE Resistant     * MODERATE METHICILLIN RESISTANT STAPHYLOCOCCUS AUREUS     Time coordinating discharge: 40 minutes      SIGNED:   Edwin Dada, MD  [facility] 08/08/2018, 3:58 PM

## 2018-08-08 NOTE — Progress Notes (Signed)
[facility]    NEPHROLOGY PROGRESS NOTE  SUBJECTIVE: Denies any chest pain or shortness of breath.  No acute events.  Denies fevers, chills, skin rash, arthralgias or myalgias.  Patient upset about the length of her dialysis.  All other review of systems are negative.   OBJECTIVE:  Vitals:   08/08/18 1030 08/08/18 1100  BP: 134/81 131/83  Pulse: 86 88  Resp:  17  Temp:  98.7 F (37.1 C)  SpO2:  98%    Intake/Output Summary (Last 24 hours) at 08/08/2018 1251 Last data filed at 08/08/2018 1100 Gross per 24 hour  Intake 480 ml  Output 2000 ml  Net -1520 ml      General:  AAOx3 NAD HEENT: MMM [place] AT anicteric sclera Neck:  No JVD, no adenopathy, right IJ tunneled dialysis catheter CV:  Heart RRR left brachial basophilic AV fistula with good thrill Lungs:  L/S CTA bilaterally Abd:  abd SNT/ND with normal BS GU:  Bladder non-palpable Extremities:  No LE edema.  Wound VAC in place Skin:  No skin rash  MEDICATIONS:  . amLODipine  5 mg Oral QHS  . calcitRIOL  0.5 mcg Oral Daily  . calcium acetate  1,334 mg Oral TID WC  . Chlorhexidine Gluconate Cloth  6 each Topical Q0600  . Chlorhexidine Gluconate Cloth  6 each Topical Q0600  . darbepoetin (ARANESP) injection - DIALYSIS  200 mcg Intravenous Q Fri-HD  . feeding supplement (NEPRO CARB STEADY)  237 mL Oral TID WC  . heparin  5,000 Units Subcutaneous Q8H  . insulin pump   Subcutaneous TID AC, HS, 0200  . loratadine  10 mg Oral QHS  . oxyCODONE  10 mg Oral Once  . pravastatin  40 mg Oral Daily  . sodium chloride flush  10-40 mL Intracatheter Q12H  . sodium chloride flush  3 mL Intravenous Q12H  . vancomycin variable dose per unstable renal function (pharmacist dosing)   Does not apply See admin instructions       LABS:   CBC Latest Ref Rng & Units 08/08/2018 08/06/2018 08/04/2018  WBC 4.0 - 10.5 K/uL 13.9(H) 13.9(H) -  Hemoglobin 12.0 - 15.0 g/dL 8.1(L) 8.3(L) 9.5(L)  Hematocrit 36.0 - 46.0 % 26.7(L) 26.5(L)  28.0(L)  Platelets 150 - 400 K/uL 319 314 -    CMP Latest Ref Rng & Units 08/08/2018 08/06/2018 08/05/2018  Glucose 70 - 99 mg/dL 107(H) 88 324(H)  BUN 6 - 20 mg/dL 17 22(H) 19  Creatinine 0.44 - 1.00 mg/dL 3.81(H) 3.87(H) 3.10(H)  Sodium 135 - 145 mmol/L 137 135 134(L)  Potassium 3.5 - 5.1 mmol/L 4.6 4.0 4.2  Chloride 98 - 111 mmol/L 102 94(L) 95(L)  CO2 22 - 32 mmol/L 25 26 22   Calcium 8.9 - 10.3 mg/dL 6.7(L) 6.8(L) 6.9(L)  Total Protein 6.5 - 8.1 g/dL - - -  Total Bilirubin 0.3 - 1.2 mg/dL - - -  Alkaline Phos 38 - 126 U/L - - -  AST 15 - 41 U/L - - -  ALT 0 - 44 U/L - - -    Lab Results  Component Value Date   PTH 399 (H) 07/31/2018   CALCIUM 6.7 (L) 08/08/2018   CAION 1.01 (L) 08/04/2018   PHOS 4.6 08/08/2018       Component Value Date/Time   COLORURINE YELLOW 07/29/2018 1524   APPEARANCEUR HAZY (A) 07/29/2018 1524   LABSPEC 1.010 07/29/2018 1524   PHURINE 5.0 07/29/2018 1524   GLUCOSEU >=500 (A) 07/29/2018  [place] (A) 01/26/2015 1200   HGBUR SMALL (A) 07/29/2018 1524   BILIRUBINUR NEGATIVE 07/29/2018 1524   KETONESUR NEGATIVE 07/29/2018 1524   PROTEINUR >=300 (A) 07/29/2018 1524   UROBILINOGEN 0.2 01/26/2015 1200   NITRITE POSITIVE (A) 07/29/2018 1524   LEUKOCYTESUR SMALL (A) 07/29/2018 1524      Component Value Date/Time   PHART 7.244 (L) 06/09/2016 1726   PCO2ART 24.2 (L) 06/09/2016 1726   PO2ART 111 (H) 06/09/2016 1726   HCO3 30.9 (H) 08/04/2018 1826   TCO2 32 08/04/2018 1826   ACIDBASEDEF 15.7 (H) 06/09/2016 1726   O2SAT 54.0 08/04/2018 1826       Component Value Date/Time   IRON 27 (L) 07/31/2018 1641   TIBC 171 (L) 07/31/2018 1641   FERRITIN 232 07/30/2018 0104   IRONPCTSAT 16 07/31/2018 1641       ASSESSMENT/PLAN:     48 year old female patient with a past medical history significant for type 1 diabetes, hypertension, dyslipidemia, MRSA bacteremia, and diabetic nephropathy presented for worsening renal function and new end-stage renal  disease.  She was found to have MRSA bacteremia and a right foot abscess and was status post I&D on 4/30 and 5/4.  1.  End-stage renal disease on hemodialysis.  We will continue outpatient dialysis at for [facility] on a Tuesday Thursday Saturday schedule.  For next dialysis tomorrow.  2.  Secondary hyperparathyroidism.  Continue calcitriol, Tums, and calcium acetate.  3.  MRSA bacteremia and right foot abscess status post I and D on 4/30 and on 5/4.  Continue IV vancomycin and dialysis.  4.  Hypertension.  Continue amlodipine.  Blood pressure stable.  5.  Type 1 diabetes.  Continue current insulin regimen.  6.  Anemia.  Continue ESA.  Iron on hold due to sepsis.      [place], DO, [place]

## 2018-08-09 LAB — GLUCOSE, CAPILLARY
Glucose-Capillary: 512 mg/dL (ref 70–99)
Glucose-Capillary: 464 mg/dL — ABNORMAL HIGH (ref 70–99)

## 2018-08-09 LAB — AEROBIC/ANAEROBIC CULTURE W GRAM STAIN (SURGICAL/DEEP WOUND)

## 2018-08-09 NOTE — Progress Notes (Signed)
[name] to be D/C'd home with home health per MD order. Discussed with the patient and sister (on phone) and all questions fully answered. MD notified about recent CBG of 464. Patient's sister bringing insulin pump. VVS, Skin clean, dry and intact without evidence of skin break down, no evidence of skin tears noted. Portable wound vac applied and provided with replacement canisters. HD catheter left in place. An After Visit Summary was printed and given to the patient. Meds delivered to room yesterday by [facility].  Patient escorted via [place], and D/C home via private auto.  [name]  08/09/2018 10:47 AM

## 2018-08-09 NOTE — Progress Notes (Signed)
Patient has had transfer orders throughout the day but has been waiting for her wound vac to be delivered to the floor. Patient's wound vac arrived around 7:45pm. Around 8:45pm after gathering the patient's discharge instructions, this RN and another RN Shanon Payor), went into the room to replace the wound vac and prepare the patient to be discharged. The patient said " It's 9pm I'm not going home now, you guys are going to take care of me tonight and you're going to have to send me to dialysis here in the morning". This RN let her know the reasoning she was just now being able to be discharged since the wound vac had just arrived.   This RN also told the charge nurse and paged Dr. Silas [place] about what was going on. Dr. Loleta Books called this RN and questioned why the patient was not leaving. The patient is alert and oriented and has already called her sister to tell her what is going on.   Will continue to monitor.

## 2018-08-09 NOTE — Progress Notes (Signed)
PROGRESS NOTE    CHONITA GADEA  TIR:[id number] DOB: [birth year] DOA: 07/29/2018 PCP: Loyola Mast, PA-C      Brief Narrative:  Mrs. Dell is a 48 y.o. F with T1DM, previous partial foot amputations, and HTN who presented with right foot swelling from wound clinic, found in the ER to have tachycardia, leukocytosis 20K, and MRI with tenosynovitis extending into the right tarsal joint as well as osteomyelitis of the medial cuneiform and bases of 1st and 2nd metatarsals as well as acute renal failure with Cr 7.0 mg/dL and BUN 101.      Assessment & Plan:  Acute renal failure S/p brachiobasilic left fistula To start TTS HD at [facility] today.   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  Dose today after dialysis.  Type 1 diabetes with retinopathy Overnight, patient's insulin pump came out.  Rather than calling sister to bring her pump supplies or starting subQ basal insulin, overnight cross-cover gave small doses of correction insulin only a few times.  This morning, glucose >300.  Patient refused to call sister to bring supplies.  Refused AM BMP.  I recommended basal insulin, mealtime shots, BMP and remaining in hospital for HD here today because of risks of uncontrolled glucose, but patient refused. She had her sister pick her up and she went to her HD session today.  Hypertension    SARS-CoV-2 testing was obtained for screening purposes.  COVID ruled out.      MDM and disposition: The patient was discharged yesterday, but due to logistical errors, her wound vac was not delivered until 8PM, at which time she refused to leave.         Subjective: No malaise, fever, vomiting, abdominal pain, confusion.  Objective: Vitals:   08/08/18 1100 08/08/18 1549 08/08/18 2034 08/09/18 0636  BP: 131/83 (!) 145/75  123/68 (!) 147/73  Pulse: 88 89 88 84  Resp: 17 17    Temp: 98.7 F (37.1 C) 98.9 F (37.2 C) 99 F (37.2 C) 98.1 F (36.7 C)  TempSrc: Oral Oral Oral Oral  SpO2: 98% 100% 97% 100%  Weight: 53.6 kg     Height:        Intake/Output Summary (Last 24 hours) at 08/09/2018 1544 Last data filed at 08/09/2018 0500 Gross per 24 hour  Intake 120 ml  Output -  Net 120 ml   Filed Weights   08/06/18 1754 08/08/18 0650 08/08/18 1100  Weight: 55.4 kg 57.3 kg 53.6 kg    Examination: General appearance: Adult female, lying in bed, no acute distress, interactive.     Data Reviewed: I have personally reviewed following labs and imaging studies:  CBC: Recent Labs  Lab 08/03/18 0440 08/04/18 0416 08/04/18 1826 08/06/18 0432 08/08/18 0708  WBC 11.5* 11.2*  --  13.9* 13.9*  NEUTROABS  --  7.8*  --   --   --   HGB 8.6* 8.8* 9.5* 8.3* 8.1*  HCT 26.5* 27.0* 28.0* 26.5* 26.7*  MCV 85.8 84.1  --  87.7 90.2  PLT 384 445*  --  314 676   Basic Metabolic Panel: Recent Labs  Lab 08/03/18 0440 08/04/18 0416 08/04/18 1826 08/05/18 0412 08/06/18 0432 08/08/18 0708  NA 138 135 135 134* 135 137  K 3.9 4.1 3.4* 4.2 4.0 4.6  CL 100 94*  --  95* 94* 102  CO2 25 25  --  22 26 25   GLUCOSE 355* 302*  --  324* 88 107*  BUN 22* 33*  --  19 22* 17  CREATININE 3.22* 4.25*  --  3.10* 3.87* 3.81*  CALCIUM 6.5* 6.3*  --  6.9* 6.8* 6.7*  PHOS 4.4 4.0  --  3.7 4.2 4.6   GFR: Estimated Creatinine Clearance: 15.6 mL/min (A) (by C-G formula based on SCr of 3.81 mg/dL (H)). Liver Function Tests: Recent Labs  Lab 08/03/18 0440 08/04/18 0416 08/05/18 0412 08/06/18 0432 08/08/18 0708  ALBUMIN 1.4* 1.5* 1.5* 1.5* 1.6*   No results for input(s): LIPASE, AMYLASE in the last 168 hours. No results for input(s): AMMONIA in the last 168 hours. Coagulation Profile: No results for input(s): INR, PROTIME in the last 168 hours. Cardiac Enzymes: No results for input(s): CKTOTAL, CKMB, CKMBINDEX, TROPONINI  in the last 168 hours. BNP (last 3 results) No results for input(s): PROBNP in the last 8760 hours. HbA1C: No results for input(s): HGBA1C in the last 72 hours. CBG: Recent Labs  Lab 08/08/18 1551 08/08/18 1739 08/08/18 2323 08/09/18 0632 08/09/18 0924  GLUCAP 280* 366* 442* 512* 464*   Lipid Profile: No results for input(s): CHOL, HDL, LDLCALC, TRIG, CHOLHDL, LDLDIRECT in the last 72 hours. Thyroid Function Tests: No results for input(s): TSH, T4TOTAL, FREET4, T3FREE, THYROIDAB in the last 72 hours. Anemia Panel: No results for input(s): VITAMINB12, FOLATE, FERRITIN, TIBC, IRON, RETICCTPCT in the last 72 hours. Urine analysis:    Component Value Date/Time   COLORURINE YELLOW 07/29/2018 1524   APPEARANCEUR HAZY (A) 07/29/2018 1524   LABSPEC 1.010 07/29/2018 1524   PHURINE 5.0 07/29/2018 1524   GLUCOSEU >=500 (A) 07/29/2018 1524   GLUCOSEU 500 (A) 01/26/2015 1200   HGBUR SMALL (A) 07/29/2018 1524   BILIRUBINUR NEGATIVE 07/29/2018 1524   KETONESUR NEGATIVE 07/29/2018 1524   PROTEINUR >=300 (A) 07/29/2018 1524   UROBILINOGEN 0.2 01/26/2015 1200   NITRITE POSITIVE (A) 07/29/2018 1524   LEUKOCYTESUR SMALL (A) 07/29/2018 1524   Sepsis Labs: @LABRCNTIP (procalcitonin:4,lacticacidven:4)  ) Recent Results (from the past 240 hour(s))  Fungus Culture With Stain     Status: None (Preliminary result)   Collection Time: 07/31/18  6:52 PM  Result Value Ref Range Status   Fungus Stain Final report  Final    Comment: (NOTE) Performed At: [facility] [place], [place] [id number] Rush Farmer MD YC:[id number]    Fungus (Mycology) Culture PENDING  Incomplete   Fungal Source ABSCESS  Final    Comment: RIGHT FOOT Performed at [facility], [place] [address], [place], [place] [address]   Aerobic/Anaerobic Culture (surgical/deep wound)     Status: None   Collection Time: 07/31/18  6:52 PM  Result Value Ref Range Status   Specimen Description ABSCESS  RIGHT FOOT  Final   Special Requests NONE  Final   Gram Stain   Final    ABUNDANT WBC PRESENT,BOTH PMN AND MONONUCLEAR ABUNDANT GRAM POSITIVE COCCI    Culture   Final    MODERATE METHICILLIN RESISTANT STAPHYLOCOCCUS AUREUS NO ANAEROBES ISOLATED Performed at [facility], [place] [address] [place], [place] [address]    Report Status 08/05/2018 FINAL  Final   Organism ID, Bacteria METHICILLIN RESISTANT STAPHYLOCOCCUS AUREUS  Final      Susceptibility   Methicillin resistant staphylococcus aureus - MIC*    CIPROFLOXACIN >=8 RESISTANT Resistant     ERYTHROMYCIN >=8 RESISTANT Resistant     GENTAMICIN <=0.5 SENSITIVE Sensitive  OXACILLIN >=4 RESISTANT Resistant     TETRACYCLINE >=16 RESISTANT Resistant     VANCOMYCIN 1 SENSITIVE Sensitive     TRIMETH/SULFA <=10 SENSITIVE Sensitive     CLINDAMYCIN RESISTANT Resistant     RIFAMPIN <=0.5 SENSITIVE Sensitive     Inducible Clindamycin POSITIVE Resistant     * MODERATE METHICILLIN RESISTANT STAPHYLOCOCCUS AUREUS  Fungus Culture Result     Status: None   Collection Time: 07/31/18  6:52 PM  Result Value Ref Range Status   Result 1 Comment  Final    Comment: (NOTE) KOH/Calcofluor preparation:  no fungus observed. Performed At: [facility] [place], [place] [id number] Rush Farmer MD GM:[id number]   Culture, blood (routine x 2)     Status: None   Collection Time: 08/01/18 10:50 AM  Result Value Ref Range Status   Specimen Description BLOOD LEFT HAND  Final   Special Requests   Final    BOTTLES DRAWN AEROBIC AND ANAEROBIC Blood Culture adequate volume   Culture   Final    NO GROWTH 5 DAYS Performed at [facility], [address] [place], [place] [address]    Report Status 08/06/2018 FINAL  Final  Culture, blood (routine x 2)     Status: None   Collection Time: 08/01/18 11:00 AM  Result Value Ref Range Status   Specimen Description BLOOD RIGHT HAND  Final   Special Requests   Final    AEROBIC  BOTTLE ONLY Blood Culture results may not be optimal due to an inadequate volume of blood received in culture bottles   Culture   Final    NO GROWTH 5 DAYS Performed at [facility], [place] [address] [place], [place] [address]    Report Status 08/06/2018 FINAL  Final  Aerobic/Anaerobic Culture (surgical/deep wound)     Status: None   Collection Time: 08/04/18  8:19 PM  Result Value Ref Range Status   Specimen Description WOUND RIGHT FOOT  Final   Special Requests NONE  Final   Gram Stain   Final    RARE WBC PRESENT, PREDOMINANTLY MONONUCLEAR FEW GRAM POSITIVE COCCI    Culture   Final    MODERATE METHICILLIN RESISTANT STAPHYLOCOCCUS AUREUS NO ANAEROBES ISOLATED Performed at [facility], [place] [address] [place], [place] [address]    Report Status 08/09/2018 FINAL  Final   Organism ID, Bacteria METHICILLIN RESISTANT STAPHYLOCOCCUS AUREUS  Final      Susceptibility   Methicillin resistant staphylococcus aureus - MIC*    CIPROFLOXACIN >=8 RESISTANT Resistant     ERYTHROMYCIN >=8 RESISTANT Resistant     GENTAMICIN <=0.5 SENSITIVE Sensitive     OXACILLIN >=4 RESISTANT Resistant     TETRACYCLINE >=16 RESISTANT Resistant     VANCOMYCIN <=0.5 SENSITIVE Sensitive     TRIMETH/SULFA <=10 SENSITIVE Sensitive     CLINDAMYCIN RESISTANT Resistant     RIFAMPIN <=0.5 SENSITIVE Sensitive     Inducible Clindamycin POSITIVE Resistant     * MODERATE METHICILLIN RESISTANT STAPHYLOCOCCUS AUREUS         Radiology Studies: No results found.      Scheduled Meds:  Continuous Infusions:    LOS: 11 days    Time spent: 15 minutes  Edwin Dada, MD [facility] 08/09/2018, 3:44 PM     Please page through AMION:  [email or web address] Password [email or web address] If 7PM-7AM, please contact night-coverage

## 2018-08-09 NOTE — Progress Notes (Signed)
Pt refused to allow me to assess her [facility] Cath or draw morning labs

## 2018-08-09 NOTE — Progress Notes (Signed)
Patient's CBG 512, notified Bodenheimer, MD. Advised to allow her to go by what her insulin pump says. Patient received 4 units of insulin.

## 2018-08-09 NOTE — TOC Transition Note (Addendum)
Transition of Care [facility]) - CM/SW Discharge Note   Patient Details  Name: [name] MRN: [id number] Date of Birth: [birth year]  Transition of Care [facility]) CM/SW Contact:  Carles Collet, RN Phone Number: 08/09/2018, 8:30 AM   Clinical Narrative:    DC to home, confirmed w patient that [facility] in room and she will have transport to HD today by her sister. Spoke w nurse and notified that she will need early DC to get to HD by 11:30.  Verified that meds were delivered to room by [facility] yesterday and they are at bedside for patient to take home today.   Expected Discharge Plan and Services Expected Discharge Plan: [place]         Expected Discharge Date: 08/08/18               DME Arranged: Vac DME Agency: [employer]     Representative spoke with at DME Agency:  CM arranged VAC yesterday, spoke w patient and nurse and confirmed it has been delivered to hospital room to be hooked up and discharged with patient HH Arranged: RN [place] Agency: [employer] Higher education careers adviser spoke with at [place]: per notes, interim contacted and confirmed yesterday     Final next level of care: Home w Home Health Services Barriers to Discharge: No Barriers Identified   Patient Goals and CMS Choice Patient states their goals for this hospitalization and ongoing recovery are:: to go home CMS Medicare.gov Compare Post Acute Care list provided to:: Patient Choice offered to / list presented to : Patient  Discharge Placement                       Discharge Plan and Services                DME Arranged: Vac DME Agency: [employer]     Representative spoke with at DME Agency:  CM arranged VAC yesterday, spoke w patient and nurse and confirmed it has been delivered to hospital room to be hooked up and discharged with patient HH Arranged: RN [place] Agency: [employer] Higher education careers adviser spoke with at [place]: per notes, interim contacted and confirmed  yesterday  Social Determinants of Health (SDOH) Interventions     Readmission Risk Interventions No flowsheet data found.

## 2018-08-09 NOTE — Progress Notes (Addendum)
Inpatient Diabetes Program Recommendations  AACE/ADA: New Consensus Statement on Inpatient Glycemic Control (2015)  Target Ranges:  Prepandial:   less than 140 mg/dL      Peak postprandial:   less than 180 mg/dL (1-2 hours)      Critically ill patients:  140 - 180 mg/dL   Lab Results  Component Value Date   GLUCAP 512 (HH) 08/09/2018   HGBA1C 10.4 (H) 07/30/2018    Note that CBG up to 512 this AM.  Patient did bolus at 0615 with insulin pump 4 units. Patient refused morning labs as well. Contacted MD regarding elevated blood sugar.  Patient has been very difficult and has not adhered to hospital policy regarding insulin pump.  She likely needs to change her insulin pump site however per RN she does not have supplies.  Asked RN to recheck blood sugar.  Dr. Loleta Books aware and will see patient to further assess and formulate plan.    Thanks,  [name], RN, BC-ADM Inpatient Diabetes Coordinator Pager [phone]

## 2018-08-11 LAB — GLUCOSE, CAPILLARY: Glucose-Capillary: 562 mg/dL (ref 70–99)

## 2018-08-14 DIAGNOSIS — Z992 Dependence on renal dialysis: Secondary | ICD-10-CM

## 2018-08-14 DIAGNOSIS — N186 End stage renal disease: Secondary | ICD-10-CM

## 2018-08-26 ENCOUNTER — Telehealth: Payer: Self-pay | Admitting: *Deleted

## 2018-08-26 NOTE — Telephone Encounter (Signed)
I informed pt Dr. Amalia [name] stated there is not gauze or packing in the wound just a wound vac.

## 2018-08-26 NOTE — Telephone Encounter (Signed)
Left message telling pt I would message [name] but could use a little more description on what she is seeing.

## 2018-08-26 NOTE — Telephone Encounter (Signed)
There is no packing... Just a wound vac. - [name]

## 2018-08-26 NOTE — Telephone Encounter (Signed)
Pt called asked if there was gauze sponge in the surgery wound under the skin, pt's nurse wants to know.

## 2018-08-27 ENCOUNTER — Ambulatory Visit (INDEPENDENT_AMBULATORY_CARE_PROVIDER_SITE_OTHER): Payer: Medicare Other | Admitting: Podiatry

## 2018-08-27 ENCOUNTER — Encounter: Payer: Self-pay | Admitting: Podiatry

## 2018-08-27 ENCOUNTER — Other Ambulatory Visit: Payer: Self-pay

## 2018-08-27 VITALS — Temp 98.4°F

## 2018-08-27 DIAGNOSIS — L97514 Non-pressure chronic ulcer of other part of right foot with necrosis of bone: Secondary | ICD-10-CM | POA: Diagnosis not present

## 2018-08-27 DIAGNOSIS — M868X7 Other osteomyelitis, ankle and foot: Secondary | ICD-10-CM

## 2018-08-27 DIAGNOSIS — E0843 Diabetes mellitus due to underlying condition with diabetic autonomic (poly)neuropathy: Secondary | ICD-10-CM

## 2018-08-27 NOTE — Progress Notes (Signed)
HPI: 48 year old female T1DM presents status post irrigation with debridement and hospital admission.  Patient's last DOS 08/04/2018.  Patient currently on dialysis every other day receiving IV antibiotics.  She continues to be not amenable to below-knee amputation.  Patient was discharged with wound VAC however she discontinued the wound VAC yesterday and remove the dressings.  Past Medical History:  Diagnosis Date  . Anemia   . Arthritis   . Bronchitis   . C. difficile diarrhea 09/26/2014  . Cataracts, both eyes   . Cellulitis of right foot 06/02/2014  . CKD (chronic kidney disease) stage 3, GFR 30-59 ml/min (HCC)    sees Dr Justin Mend- Stage IV  . Diabetic ulcer of right foot ([place]) 06/02/2014  . DVT (deep venous thrombosis) ([place]) 10/2014   one in each one in leg- PICC line , one in right and left arm  . H/O seasonal allergies   . Heart murmur    told once when she was pregnant- no furter mention- was in notes 11/2015- had Echo 8 /4/17  . History of blood transfusion   . Hypercholesteremia   . Hypertension   . Left foot infection   . Neuropathy    feet  . Neuropathy in diabetes ([place])   . Peripheral vascular disease ([place])   . Sleep apnea    not able to use CPAP  . Staphylococcus aureus bacteremia 10/2014  . Type 1 diabetes ([place])    onset age 21       Physical Exam: General: The patient is alert and oriented x3 in no acute distress.  Patient is actually in much better spirits today as compared to inpatient.  Wound noted to the right foot measures approximately 12.06.0 cm (LxW).  Wound is mostly fibrotic with portions of necrotic bone exposed.  There is also exposed joint.  Very focal small amounts of granular tissue noted proximally.  Moderate serous drainage noted.  No malodor.  Periwound integrity is very erythematous.  Assessment: 1. CKD currently on dialysis 3x/week 2. T1DM - poorly controlled 3.  Osteomyelitis right foot 4.  Ulcer right foot 5.  History of partial foot  amputations bilateral   Plan of Care:  1. Patient evaluated.  Today again I recommended that the patient needs below-knee amputation.  It is life-threatening and can quickly lead to sepsis.  Patient states that she would like to avoid below-knee amputation and do everything she can to save the leg.  She understands this is against all medical advice 2.  Patient also wants to discontinue the negative pressure wound VAC.  She states that medihoney healed her wound last visit and she would like to pursue medihoney wound application again.  Today we will order medihoney for the patient. 3.  I do not believe the right lower extremity is salvageable the patient understands this, however if she would like to attempt to save her leg hyperbaric oxygen treatment modality may be of benefit.  Recommend follow-up at the [facility] wound care center 4.  Continue dialysis administered antibiotics overseen by infectious disease 5.  Return to clinic in 1 week      Edrick Kins, DPM [facility]  Dr. Edrick Kins, DPM    [address],  [place] [address]                Office [phone]  Fax [phone]

## 2018-08-29 LAB — FUNGAL ORGANISM REFLEX

## 2018-08-29 LAB — FUNGUS CULTURE WITH STAIN

## 2018-08-29 LAB — FUNGUS CULTURE RESULT

## 2018-09-01 ENCOUNTER — Other Ambulatory Visit: Payer: Self-pay

## 2018-09-01 ENCOUNTER — Ambulatory Visit (INDEPENDENT_AMBULATORY_CARE_PROVIDER_SITE_OTHER): Payer: Medicare HMO | Admitting: Endocrinology

## 2018-09-01 ENCOUNTER — Encounter: Payer: Self-pay | Admitting: Endocrinology

## 2018-09-01 VITALS — BP 160/82 | HR 90 | Ht 68.0 in | Wt 125.6 lb

## 2018-09-01 DIAGNOSIS — E1065 Type 1 diabetes mellitus with hyperglycemia: Secondary | ICD-10-CM

## 2018-09-01 NOTE — Patient Instructions (Signed)
MUST BOLUS AT THE TIME OF EATING based on carbs and sugar level  Change time on pump

## 2018-09-01 NOTE — Progress Notes (Signed)
Patient ID: [name], female   DOB: 07-Jul-1970, 48 y.o.   MRN: [id number]   Reason for Appointment: follow-up of diabetes  History of Present Illness   Diagnosis: Type 1 DIABETES MELITUS  Previous history: She has had long-standing poorly controlled diabetes and typically has poor compliance with self care measures despite periodic diabetes education and periodic followup  INSULIN PUMP regimen:   Currently using MEDTRONIC 723 model   Basal rates: Midnight to 11 AM = 0.4, at 1130 AM = 0.8, 3 PM = 0.6 until 10 PM, 10 PM = 0.75   Carb coverage 1: 20 g and correction 1: 70 during the day and 80 from 10 PM-8 AM Glucose target 150, active insulin 4 hours         Recent history:   Her last A1c was 10.4   Current blood sugar patterns, difficulties with management and problems identified:   She is not using the freestyle libre as she had difficulty getting this from her supplier  Her blood sugars are being checked somewhat sporadically and sometimes only once a day  Her insulin pump time is off by 1 hour  As before she is significantly afraid of hypoglycemia and will generally allow her blood sugars to go up before taking any bolus insulin  She now says that her sugars are high because she will not bolus before the meals and only weight at least 1 hour after eating to check the sugar and then take a bolus with some carbohydrate entry  With this most of her blood sugars are at least over 300  Her diet is also poor  She will frequently have high fat and high carbohydrate meals, today for lunch she had 2 hotdogs, bread and cream cheese, she did not bolus for this because she did not have time to do this  She has had only 1 relatively low blood sugar of 70 documented around noon about 3 days ago  Otherwise has only 2 more readings below 200, once at 8 AM and another time at 7 PM  Currently is not suspending her pump as per her download information     PRE-MEAL Fasting Lunch  afternoon  evening Overall  Glucose range:  60-400+ ?   402, 504  143-509   Mean/median:        POST-MEAL PC Breakfast PC Lunch  late-night  Glucose range:    418-600  Mean/median:      Meals: eating at variable times and also having snacks periodically.  Her daily carbohydrate intake is quite variable Physical activity: exercise: none         Dietician visit: Most recent:?          Complications: are: Neuropathy, nephropathy, diabetic foot ulcer, amputation    Wt Readings from Last 3 Encounters:  08/08/18 118 lb 2.7 oz (53.6 kg)  06/25/18 131 lb 6.4 oz (59.6 kg)  03/21/18 125 lb 9.6 oz (57 kg)   Lab Results  Component Value Date   HGBA1C 10.4 (H) 07/30/2018   HGBA1C 10.3 (H) 06/12/2018   HGBA1C 10.1 (H) 01/13/2018   Lab Results  Component Value Date   MICROALBUR 304.2 (H) 11/11/2017   LDLCALC 78 05/02/2016   CREATININE 3.81 (H) 08/08/2018   No visits with results within 1 Week(s) from this visit.  Latest known visit with results is:  No results displayed because visit has over 200 results.      Other problems addressed:: See review of systems  Allergies as of 09/01/2018      Reactions   Cleocin [clindamycin Hcl] Diarrhea   Lisinopril Other (See Comments)   Elevated potassium per pt report   Amoxicillin Diarrhea, Nausea Only, Other (See Comments)   Has patient had a PCN reaction causing immediate rash, facial/tongue/throat swelling, SOB or lightheadedness with hypotension: No Has patient had a PCN reaction causing severe rash involving mucus membranes or skin necrosis: No Has patient had a PCN reaction that required hospitalization: No Has patient had a PCN reaction occurring within the last 10 years: No If all of the above answers are "NO", then may proceed with Cephalosporin use.   Bactrim [sulfamethoxazole-trimethoprim] Diarrhea, Nausea Only      Medication List       Accurate as of September 01, 2018  2:26 PM. If you have any questions,  ask your nurse or doctor.        albuterol 108 (90 Base) MCG/ACT inhaler Commonly known as:  VENTOLIN HFA Inhale 2 puffs into the lungs every 6 (six) hours as needed for wheezing or shortness of breath. Reported on 04/15/2015   amLODipine 5 MG tablet Commonly known as:  NORVASC Take 1 tablet (5 mg total) by mouth daily. What changed:  when to take this   aspirin 81 MG tablet Take 81 mg by mouth every evening.   BD Pen Needle Nano U/F 32G X 4 MM Misc Generic drug:  Insulin Pen Needle USE AS DIRECTED TWICE A DAY   calcitRIOL 0.5 MCG capsule Commonly known as:  ROCALTROL Take 1 capsule daily What changed:    how much to take  how to take this  when to take this  additional instructions   calcium acetate 667 MG capsule Commonly known as:  PHOSLO Take 2 capsules (1,334 mg total) by mouth 3 (three) times daily with meals.   CINNAMON PO Take 1 tablet by mouth every evening.   folic acid 1 MG tablet Commonly known as:  FOLVITE Take 1 mg by mouth every evening.   FreeStyle Libre Reader Devi 1 Device by Does not apply route as directed.   FreeStyle Optician, dispensing Use 1 sensor every 10 days   HumaLOG 100 UNIT/ML injection Generic drug:  insulin lispro CALL MD IF <70, IF 151-200 =2UNITS,201-250 =4 UNITS,251-300 =6 UNITS, 301-350 =8 UNITS,351-400 =10 U What changed:  See the new instructions.   pravastatin 40 MG tablet Commonly known as:  PRAVACHOL TAKE 1 TABLET BY MOUTH EVERY DAY   pyridoxine 100 MG tablet Commonly known as:  B-6 Take 100 mg by mouth daily. Reported on 04/15/2015   TURMERIC PO Take 1 capsule by mouth daily.   vancomycin 500-5 MG/100ML-% IVPB Commonly known as:  VANCOCIN Inject 100 mLs (500 mg total) into the vein every Monday, Wednesday, and Friday with hemodialysis.   Vitamin D (Ergocalciferol) 1.25 MG (50000 UT) Caps capsule Commonly known as:  DRISDOL TAKE ONE CAPSULE BY MOUTH ONE TIME PER WEEK       Allergies:  Allergies   Allergen Reactions  . Cleocin [Clindamycin Hcl] Diarrhea  . Lisinopril Other (See Comments)    Elevated potassium per pt report  . Amoxicillin Diarrhea, Nausea Only and Other (See Comments)    Has patient had a PCN reaction causing immediate rash, facial/tongue/throat swelling, SOB or lightheadedness with hypotension: No Has patient had a PCN reaction causing severe rash involving mucus membranes or skin necrosis: No Has patient had a PCN reaction that required hospitalization: No Has patient  had a PCN reaction occurring within the last 10 years: No If all of the above answers are "NO", then may proceed with Cephalosporin use.   . Bactrim [Sulfamethoxazole-Trimethoprim] Diarrhea and Nausea Only    Past Medical History:  Diagnosis Date  . Anemia   . Arthritis   . Bronchitis   . C. difficile diarrhea 09/26/2014  . Cataracts, both eyes   . Cellulitis of right foot 06/02/2014  . CKD (chronic kidney disease) stage 3, GFR 30-59 ml/min (HCC)    sees Dr Justin Mend- Stage IV  . Diabetic ulcer of right foot ([place]) 06/02/2014  . DVT (deep venous thrombosis) ([place]) 10/2014   one in each one in leg- PICC line , one in right and left arm  . H/O seasonal allergies   . Heart murmur    told once when she was pregnant- no furter mention- was in notes 11/2015- had Echo 8 /4/17  . History of blood transfusion   . Hypercholesteremia   . Hypertension   . Left foot infection   . Neuropathy    feet  . Neuropathy in diabetes ([place])   . Peripheral vascular disease ([place])   . Sleep apnea    not able to use CPAP  . Staphylococcus aureus bacteremia 10/2014  . Type 1 diabetes ([place])    onset age 50    Past Surgical History:  Procedure Laterality Date  . AMPUTATION TOE Left 07/24/2015   5th toe and Hallux amputation  . AV FISTULA PLACEMENT Left 08/06/2018   Procedure: INSERTION OF BASCILIC VEN TRANSPOSITION 1ST STAGE;  Surgeon: Rosetta Posner, MD;  Location: [facility];  Service: Vascular;  Laterality: Left;  . CESAREAN  SECTION    . EXOSTECTECTOMY TOE Left 04/11/2016   Procedure: EXOSTECTECTOMY CUBOID AND 5TH METATARSAL AND PLACEMENT OF ANTIBIOTIC BEADS;  Surgeon: Edrick Kins, DPM;  Location: [facility];  Service: Podiatry;  Laterality: Left;  . EYE SURGERY Bilateral    [place]  . FEMORAL-POPLITEAL BYPASS GRAFT Left 05/03/2015  . FOOT AMPUTATION THROUGH METATARSAL Right 2016  . hemrrhoidectomy    . I&D EXTREMITY Right 06/10/2014   Procedure: IRRIGATION AND DEBRIDEMENT Right Foot;  Surgeon: Newt Minion, MD;  Location: [facility];  Service: Orthopedics;  Laterality: Right;  . I&D EXTREMITY Right 07/31/2018   Procedure: IRRIGATION AND DEBRIDEMENT EXTREMITY;  Surgeon: Edrick Kins, DPM;  Location: [facility];  Service: Podiatry;  Laterality: Right;  . I&D EXTREMITY Right 08/04/2018   Procedure: IRRIGATION AND DEBRIDEMENT FOOT with placement of wound vac;  Surgeon: Edrick Kins, DPM;  Location: [facility];  Service: Podiatry;  Laterality: Right;  . INCISION AND DRAINAGE Left 04/11/2016   Procedure: INCISION AND DRAINAGE A DEEP COMPLICATED WOUND OF LEFT FOOT;  Surgeon: Edrick Kins, DPM;  Location: [facility];  Service: Podiatry;  Laterality: Left;  . INSERTION OF DIALYSIS CATHETER Right 08/06/2018   Procedure: INSERTION OF TUNNELED DIALYSIS CATHETER;  Surgeon: Rosetta Posner, MD;  Location: [facility];  Service: Vascular;  Laterality: Right;  . IR FLUORO GUIDE CV LINE RIGHT  09/24/2017  . IR FLUORO GUIDE CV LINE RIGHT  08/01/2018  . IR REMOVAL TUN CV CATH W/O FL  11/22/2017  . IR US GUIDE VASC ACCESS RIGHT  09/24/2017  . IR US GUIDE VASC ACCESS RIGHT  08/01/2018  . IRRIGATION AND DEBRIDEMENT ABSCESS Left 05/06/2017   Procedure: IRRIGATION AND DEBRIDEMENT ABSCESS LEFT FOOT;  Surgeon: Edrick Kins, DPM;  Location: [facility];  Service: Podiatry;  Laterality: Left;  . PERIPHERALLY INSERTED CENTRAL CATHETER INSERTION    . SKIN GRAFT Right 06/15/2014  . SKIN GRAFT Left 06/2015   foot   . SKIN SPLIT GRAFT Right 06/15/2014   Procedure: SPLIT THICKNESS SKIN  GRAFT RIGHT FOOT;  Surgeon: Newt Minion, MD;  Location: [facility];  Service: Orthopedics;  Laterality: Right;  . TRANSMETATARSAL AMPUTATION Right   . TRANSMETATARSAL AMPUTATION Right 09/11/2014    Family History  Problem Relation Age of Onset  . Hyperlipidemia Mother   . Dementia Mother     Social History:  reports that she quit smoking about 10 years ago. She quit after 15.00 years of use. She has never used smokeless tobacco. She reports current alcohol use. She reports that she does not use drugs.  Review of Systems:   Hypertension/CKD:     Hypertension  Treated with amlodipine 5 mg, followed by nephrologist  BP Readings from Last 3 Encounters:  08/09/18 (!) 147/73  06/25/18 140/70  03/21/18 134/78     Lab Results  Component Value Date   K 4.6 08/08/2018   Renal function is worse and is now on dialysis   Lab Results  Component Value Date   CREATININE 3.81 (H) 08/08/2018   CREATININE 3.87 (H) 08/06/2018   CREATININE 3.10 (H) 08/05/2018    She is taking calcitriol from nephrologist  Also has vitamin D deficiency   Lab Results  Component Value Date   CALCIUM 6.7 (L) 08/08/2018   PHOS 4.6 08/08/2018     Lipids: Has been  treated with pravastatin Triglycerides are high on the last measurement LDL as follows   Lab Results  Component Value Date   CHOL 152 06/12/2018   HDL 24.40 (L) 06/12/2018   LDLCALC 78 05/02/2016   LDLDIRECT 82.0 06/12/2018   TRIG 222.0 (H) 06/12/2018   CHOLHDL 6 06/12/2018      Chronic anemia, followed by nephrologist and PCP:  Lab Results  Component Value Date   WBC 13.9 (H) 08/08/2018   HGB 8.1 (L) 08/08/2018   HCT 26.7 (L) 08/08/2018   MCV 90.2 08/08/2018   PLT 319 08/08/2018    She has a Charcot foot, has had amputation       Examination:   Ht 5\' 8"  (1.727 m)   BMI 17.97 kg/m   Body mass index is 17.97 kg/m.      ASSESSMENT/ PLAN:   Diabetes type 1 with poor control and multiple complications    Her  diabetes is persistently poorly controlled related to inadequate insulin most of the time  See history of present illness for detailed discussion of current diabetes management, blood sugar patterns and problems identified  A1c is last over 10% as before  She is not using her CGM and difficult to identify blood sugar pattern Although she is not getting any significant hypoglycemia with reducing her basal rates previously she is also not improving her level of control overall On her insulin pump her blood sugar average is 456 for the last 2 weeks  She refuses to change her habits and continues to bolus postprandially and not proactively when she is eating Her diet is also relatively high carbohydrate and high fat Currently has stopped suspending her pump which was causing more significant hyperglycemia  Still not able to program her pump herself  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: followed by nephrologist    PLAN:     She does most likely need to have a higher  basal rate especially in the afternoons and evenings but difficult to get a consistent pattern  She does need to get back on continuous glucose monitoring for the freestyle libre and she will ask her supplier to restart this  Discussed that without her trying to bolus consistently before the meals will be difficult to get her control improved  She can at least bolus part of the carbohydrate coverage before eating and the rest later  Showed her action profile of mealtime insulin and postprandial hyperglycemia to explain the need for insulin timing, also explained that bolusing late after the meals may potentially cause more hypoglycemia also  Once she has consistently bolusing can assess the need for adjusting her basal rates and also carbohydrate ratio  Emphasized the need to check the blood sugar right before eating also  She will again try to pursue the T-Slim pump since she is eligible for  upgrade  She will program the right time on her pump which she thinks she can do herself  There are no Patient Instructions on file for this visit.   Counseling time on subjects discussed in assessment and plan sections is over 50% of today's 25 minute visit   Elayne Snare 09/01/2018, 2:26 PM      Elayne Snare 09/01/18

## 2018-09-02 ENCOUNTER — Other Ambulatory Visit: Payer: Self-pay

## 2018-09-02 ENCOUNTER — Other Ambulatory Visit: Payer: Self-pay | Admitting: Endocrinology

## 2018-09-02 MED ORDER — GLUCOSE BLOOD VI STRP: ORAL_STRIP | 3 refills | Status: DC

## 2018-09-03 ENCOUNTER — Encounter (HOSPITAL_BASED_OUTPATIENT_CLINIC_OR_DEPARTMENT_OTHER): Payer: Medicare HMO | Attending: Physician Assistant

## 2018-09-03 DIAGNOSIS — E10621 Type 1 diabetes mellitus with foot ulcer: Secondary | ICD-10-CM | POA: Insufficient documentation

## 2018-09-03 DIAGNOSIS — E1022 Type 1 diabetes mellitus with diabetic chronic kidney disease: Secondary | ICD-10-CM | POA: Insufficient documentation

## 2018-09-03 DIAGNOSIS — Z95820 Peripheral vascular angioplasty status with implants and grafts: Secondary | ICD-10-CM | POA: Insufficient documentation

## 2018-09-03 DIAGNOSIS — E1051 Type 1 diabetes mellitus with diabetic peripheral angiopathy without gangrene: Secondary | ICD-10-CM | POA: Insufficient documentation

## 2018-09-03 DIAGNOSIS — E1069 Type 1 diabetes mellitus with other specified complication: Secondary | ICD-10-CM | POA: Diagnosis not present

## 2018-09-03 DIAGNOSIS — G4730 Sleep apnea, unspecified: Secondary | ICD-10-CM | POA: Diagnosis not present

## 2018-09-03 DIAGNOSIS — Z89432 Acquired absence of left foot: Secondary | ICD-10-CM | POA: Diagnosis not present

## 2018-09-03 DIAGNOSIS — E1040 Type 1 diabetes mellitus with diabetic neuropathy, unspecified: Secondary | ICD-10-CM | POA: Insufficient documentation

## 2018-09-03 DIAGNOSIS — M86671 Other chronic osteomyelitis, right ankle and foot: Secondary | ICD-10-CM | POA: Insufficient documentation

## 2018-09-03 DIAGNOSIS — B9562 Methicillin resistant Staphylococcus aureus infection as the cause of diseases classified elsewhere: Secondary | ICD-10-CM | POA: Insufficient documentation

## 2018-09-03 DIAGNOSIS — Z992 Dependence on renal dialysis: Secondary | ICD-10-CM | POA: Insufficient documentation

## 2018-09-03 DIAGNOSIS — Z89431 Acquired absence of right foot: Secondary | ICD-10-CM | POA: Insufficient documentation

## 2018-09-03 DIAGNOSIS — N189 Chronic kidney disease, unspecified: Secondary | ICD-10-CM | POA: Insufficient documentation

## 2018-09-03 DIAGNOSIS — Z86718 Personal history of other venous thrombosis and embolism: Secondary | ICD-10-CM | POA: Diagnosis not present

## 2018-09-03 DIAGNOSIS — L97514 Non-pressure chronic ulcer of other part of right foot with necrosis of bone: Secondary | ICD-10-CM | POA: Insufficient documentation

## 2018-09-03 DIAGNOSIS — L97419 Non-pressure chronic ulcer of right heel and midfoot with unspecified severity: Secondary | ICD-10-CM | POA: Insufficient documentation

## 2018-09-03 DIAGNOSIS — Z87891 Personal history of nicotine dependence: Secondary | ICD-10-CM | POA: Insufficient documentation

## 2018-09-03 DIAGNOSIS — I129 Hypertensive chronic kidney disease with stage 1 through stage 4 chronic kidney disease, or unspecified chronic kidney disease: Secondary | ICD-10-CM | POA: Insufficient documentation

## 2018-09-03 DIAGNOSIS — L97429 Non-pressure chronic ulcer of left heel and midfoot with unspecified severity: Secondary | ICD-10-CM | POA: Insufficient documentation

## 2018-09-04 ENCOUNTER — Telehealth: Payer: Self-pay

## 2018-09-04 NOTE — Telephone Encounter (Signed)
PA initiated and approved for [employer] Next Test Strips for 1 year. Call reference #:[id number] Approval letter is being faxed.

## 2018-09-08 ENCOUNTER — Other Ambulatory Visit: Payer: Self-pay

## 2018-09-08 ENCOUNTER — Ambulatory Visit (INDEPENDENT_AMBULATORY_CARE_PROVIDER_SITE_OTHER): Payer: Medicare Other | Admitting: Podiatry

## 2018-09-08 DIAGNOSIS — M868X7 Other osteomyelitis, ankle and foot: Secondary | ICD-10-CM | POA: Diagnosis not present

## 2018-09-08 DIAGNOSIS — L97514 Non-pressure chronic ulcer of other part of right foot with necrosis of bone: Secondary | ICD-10-CM

## 2018-09-09 ENCOUNTER — Inpatient Hospital Stay: Payer: Medicare Other | Admitting: Family

## 2018-09-09 ENCOUNTER — Telehealth: Payer: Self-pay | Admitting: *Deleted

## 2018-09-09 DIAGNOSIS — E0843 Diabetes mellitus due to underlying condition with diabetic autonomic (poly)neuropathy: Secondary | ICD-10-CM

## 2018-09-09 DIAGNOSIS — M868X7 Other osteomyelitis, ankle and foot: Secondary | ICD-10-CM

## 2018-09-09 DIAGNOSIS — L97514 Non-pressure chronic ulcer of other part of right foot with necrosis of bone: Secondary | ICD-10-CM

## 2018-09-09 NOTE — Telephone Encounter (Signed)
Faxed office 08/27/2018, Op Notes of 08/04/2018 and 07/31/2018 and demographics to [facility], and will fax as addendum 09/08/2018 office notes once received to [facility].

## 2018-09-09 NOTE — Telephone Encounter (Signed)
-----   Message from Edrick Kins, DPM sent at 09/08/2018 11:43 AM EDT ----- Regarding: Referral to [facility] Please refer to vein and vascular for surgical consultation below-knee amputation.   Diagnosis: Osteomyelitis right foot  Thanks, [name]

## 2018-09-10 ENCOUNTER — Telehealth: Payer: Self-pay | Admitting: Endocrinology

## 2018-09-10 ENCOUNTER — Encounter: Payer: Self-pay | Admitting: Vascular Surgery

## 2018-09-10 ENCOUNTER — Other Ambulatory Visit: Payer: Self-pay

## 2018-09-10 ENCOUNTER — Ambulatory Visit (INDEPENDENT_AMBULATORY_CARE_PROVIDER_SITE_OTHER): Payer: Medicare HMO | Admitting: Vascular Surgery

## 2018-09-10 VITALS — BP 129/68 | HR 93 | Temp 100.6°F | Resp 20 | Ht 68.0 in | Wt 125.0 lb

## 2018-09-10 DIAGNOSIS — M86371 Chronic multifocal osteomyelitis, right ankle and foot: Secondary | ICD-10-CM

## 2018-09-10 MED ORDER — GLUCOSE BLOOD VI STRP: ORAL_STRIP | 3 refills | Status: DC

## 2018-09-10 NOTE — Telephone Encounter (Signed)
Rx sent 

## 2018-09-10 NOTE — Telephone Encounter (Signed)
MEDICATION: glucose blood test strip for [employer] Next  PHARMACY:  [facility] on [address] :  Yes  IS PATIENT OUT OF MEDICATION: Yes  IF NOT; HOW MUCH IS LEFT: 0  LAST APPOINTMENT DATE: @6 /07/2018  NEXT APPOINTMENT DATE:@7 /13/2020  DO WE HAVE YOUR PERMISSION TO LEAVE A DETAILED MESSAGE: Yes  OTHER COMMENTS: [facility] but [facility] because insurance requires patient to test 4 x per day. Also patient wants to know if Chart notes have sent to [facility]. [facility] told patient that they have not received anything from our office.   **Let patient know to contact pharmacy at the end of the day to make sure medication is ready. **  ** Please notify patient to allow 48-72 hours to process**  **Encourage patient to contact the pharmacy for refills or they can request refills through [facility]**

## 2018-09-10 NOTE — Progress Notes (Signed)
REASON FOR CONSULT:    Osteomyelitis right foot the consult is requested by [name]  ASSESSMENT & PLAN:   NONHEALING WOUND RIGHT FOOT WITH OSTEOMYELITIS: This patient has a very extensive wound of the left foot with osteomyelitis by MRI.  In my opinion I do not see any way to salvage this wound and I would recommend right below the knee amputation.  I have explained that I think this is the shortest course to having her ambulating again.  This patient is at other opinions who also felt that she would need a below the knee amputation however she currently is not agreeable to this.  She plans on seeking more opinions.  I be happy to see her back at any time if she changes her mind.  Nancy Mayo, MD, FACS Beeper [phone] Office: [phone]   HPI:   [name] is a pleasant 48 y.o. female, who is referred with osteomyelitis of the right foot.  I reviewed the records from the referring office.  The patient was seen at [facility] on 08/27/2018.  This wound on the right foot has not been healing and they had recommended a below the knee amputation which the patient felt strongly against.  The patient has type 1 diabetes.  The patient has end-stage renal disease and is on dialysis.  The wound had been treated with a negative pressure dressing.  This all began with a right foot abscess.  The patient developed an abscess in the foot and underwent surgery which has not healed.  She has had drainage from the wound but denies fever currently.  She is receiving vancomycin at the time of dialysis.  She dialyzes on Tuesdays Thursdays and Saturdays.  She has a functioning left upper arm fistula.  Past Medical History:  Diagnosis Date  . Anemia   . Arthritis   . Bronchitis   . C. difficile diarrhea 09/26/2014  . Cataracts, both eyes   . Cellulitis of right foot 06/02/2014  . CKD (chronic kidney disease) stage 3, GFR 30-59 ml/min (HCC)    sees Dr Justin Mend- Stage IV  .  Diabetic ulcer of right foot ([place]) 06/02/2014  . DVT (deep venous thrombosis) ([place]) 10/2014   one in each one in leg- PICC line , one in right and left arm  . H/O seasonal allergies   . Heart murmur    told once when she was pregnant- no furter mention- was in notes 11/2015- had Echo 8 /4/17  . History of blood transfusion   . Hypercholesteremia   . Hypertension   . Left foot infection   . Neuropathy    feet  . Neuropathy in diabetes ([place])   . Peripheral vascular disease ([place])   . Sleep apnea    not able to use CPAP  . Staphylococcus aureus bacteremia 10/2014  . Type 1 diabetes ([place])    onset age 83    Family History  Problem Relation Age of Onset  . Hyperlipidemia Mother   . Dementia Mother     SOCIAL HISTORY: Social History   Socioeconomic History  . Marital status: Single    Spouse name: Not on file  . Number of children: Not on file  . Years of education: Not on file  . Highest education level: Not on file  Occupational History  . Occupation: disabled  Social Needs  . Financial resource strain: Not on file  . Food insecurity:    Worry: Not on  file    Inability: Not on file  . Transportation needs:    Medical: Not on file    Non-medical: Not on file  Tobacco Use  . Smoking status: Former Smoker    Years: 15.00    Last attempt to quit: 05/16/2008    Years since quitting: 10.3  . Smokeless tobacco: Never Used  Substance and Sexual Activity  . Alcohol use: Yes    Comment: Occasional  . Drug use: No  . Sexual activity: Yes    Birth control/protection: Pill  Lifestyle  . Physical activity:    Days per week: Not on file    Minutes per session: Not on file  . Stress: Not on file  Relationships  . Social connections:    Talks on phone: Not on file    Gets together: Not on file    Attends religious service: Not on file    Active member of club or organization: Not on file    Attends meetings of clubs or organizations: Not on file    Relationship status: Not  on file  . Intimate partner violence:    Fear of current or ex partner: Not on file    Emotionally abused: Not on file    Physically abused: Not on file    Forced sexual activity: Not on file  Other Topics Concern  . Not on file  Social History Narrative  . Not on file    Allergies  Allergen Reactions  . Cleocin [Clindamycin Hcl] Diarrhea  . Lisinopril Other (See Comments)    Elevated potassium per pt report  . Amoxicillin Diarrhea, Nausea Only and Other (See Comments)    Has patient had a PCN reaction causing immediate rash, facial/tongue/throat swelling, SOB or lightheadedness with hypotension: No Has patient had a PCN reaction causing severe rash involving mucus membranes or skin necrosis: No Has patient had a PCN reaction that required hospitalization: No Has patient had a PCN reaction occurring within the last 10 years: No If all of the above answers are "NO", then may proceed with Cephalosporin use.   . Bactrim [Sulfamethoxazole-Trimethoprim] Diarrhea and Nausea Only    Current Outpatient Medications  Medication Sig Dispense Refill  . albuterol (PROVENTIL HFA;VENTOLIN HFA) 108 (90 BASE) MCG/ACT inhaler Inhale 2 puffs into the lungs every 6 (six) hours as needed for wheezing or shortness of breath. Reported on 04/15/2015    . amLODipine (NORVASC) 5 MG tablet Take 1 tablet (5 mg total) by mouth daily. (Patient taking differently: Take 5 mg by mouth every evening. ) 30 tablet 0  . aspirin 81 MG tablet Take 81 mg by mouth every evening.     . BD PEN NEEDLE NANO U/F 32G X 4 MM MISC USE AS DIRECTED TWICE A DAY 100 each 1  . calcitRIOL (ROCALTROL) 0.5 MCG capsule Take 1 capsule daily (Patient taking differently: Take 0.5 mcg by mouth daily. ) 90 capsule 2  . calcium acetate (PHOSLO) 667 MG capsule Take 2 capsules (1,334 mg total) by mouth 3 (three) times daily with meals. 180 capsule 0  . CINNAMON PO Take 1 tablet by mouth every evening.     . Continuous Blood Gluc Receiver  (FREESTYLE LIBRE READER) DEVI 1 Device by Does not apply route as directed. 1 Device 1  . Continuous Blood Gluc Sensor (FREESTYLE LIBRE SENSOR SYSTEM) MISC Use 1 sensor every 10 days 3 each 4  . folic acid (FOLVITE) 1 MG tablet Take 1 mg by mouth every evening.     Marland Kitchen  glucose blood test strip Use Bayer Contour test strips as instructed to check blood sugar 5 times daily. 150 each 3  . HUMALOG 100 UNIT/ML injection CALL MD IF <70, IF 151-200 =2UNITS,201-250 =4 UNITS,251-300 =6 UNITS, 301-350 =8 UNITS,351-400 =10 U (Patient taking differently: INSULIN PUMP) 30 mL 0  . pravastatin (PRAVACHOL) 40 MG tablet TAKE 1 TABLET BY MOUTH EVERY DAY (Patient taking differently: Take 40 mg by mouth daily. ) 90 tablet 1  . pyridoxine (B-6) 100 MG tablet Take 100 mg by mouth daily. Reported on 04/15/2015    . TURMERIC PO Take 1 capsule by mouth daily.    . vancomycin (VANCOCIN) 500-5 MG/100ML-% IVPB Inject 100 mLs (500 mg total) into the vein every Monday, Wednesday, and Friday with hemodialysis. 100 mL   . Vitamin D, Ergocalciferol, (DRISDOL) 50000 units CAPS capsule TAKE ONE CAPSULE BY MOUTH ONE TIME PER WEEK 12 capsule 0   No current facility-administered medications for this visit.     REVIEW OF SYSTEMS:  [X]  denotes positive finding, [ ]  denotes negative finding Cardiac  Comments:  Chest pain or chest pressure:    Shortness of breath upon exertion:    Short of breath when lying flat:    Irregular heart rhythm:        Vascular    Pain in calf, thigh, or hip brought on by ambulation:    Pain in feet at night that wakes you up from your sleep:     Blood clot in your veins:    Leg swelling:         Pulmonary    Oxygen at home:    Productive cough:     Wheezing:         Neurologic    Sudden weakness in arms or legs:     Sudden numbness in arms or legs:     Sudden onset of difficulty speaking or slurred speech:    Temporary loss of vision in one eye:     Problems with dizziness:          Gastrointestinal    Blood in stool:     Vomited blood:         Genitourinary    Burning when urinating:     Blood in urine:        Psychiatric    Major depression:         Hematologic    Bleeding problems:    Problems with blood clotting too easily:        Skin    Rashes or ulcers:        Constitutional    Fever or chills:     PHYSICAL EXAM:   Vitals:   09/10/18 1041  BP: 129/68  Pulse: 93  Resp: 20  Temp: (!) 100.6 F (38.1 C)  SpO2: 95%  Weight: 125 lb (56.7 kg)  Height: 5\' 8"  (1.727 m)    GENERAL: The patient is a well-nourished female, in no acute distress. The vital signs are documented above. CARDIAC: There is a regular rate and rhythm.  VASCULAR: I do not detect carotid bruits. She has a palpable femoral and popliteal pulse on the right. She has a palpable femoral and popliteal pulse on the left. PULMONARY: There is good air exchange bilaterally without wheezing or rales. ABDOMEN: Soft and non-tender with normal pitched bowel sounds.  MUSCULOSKELETAL: There are no major deformities or cyanosis. NEUROLOGIC: No focal weakness or paresthesias are detected. SKIN: There is an extensive wound of  the right foot.  RIGHT FOOT:    LEFT FOOT:     PSYCHIATRIC: The patient has a normal affect.  DATA:    MR ANKLE: I reviewed the MRI of the ankle that was done on 07/29/2018.  This shows findings consistent with infectious tenosynovitis of the anterior tibial tendon and extensor hallucis longus tendon with pus extending into the first metatarsal joint.  There was also probable osteomyelitis involving the first and second metatarsals.  LABS: Potassium on 08/08/2018 was 4.6.

## 2018-09-11 ENCOUNTER — Telehealth: Payer: Self-pay | Admitting: Endocrinology

## 2018-09-11 ENCOUNTER — Encounter: Payer: Self-pay | Admitting: Family

## 2018-09-11 ENCOUNTER — Ambulatory Visit (INDEPENDENT_AMBULATORY_CARE_PROVIDER_SITE_OTHER): Payer: Medicare HMO | Admitting: Family

## 2018-09-11 VITALS — BP 118/73 | HR 98 | Temp 99.6°F

## 2018-09-11 DIAGNOSIS — Z992 Dependence on renal dialysis: Secondary | ICD-10-CM | POA: Diagnosis not present

## 2018-09-11 DIAGNOSIS — E1022 Type 1 diabetes mellitus with diabetic chronic kidney disease: Secondary | ICD-10-CM | POA: Diagnosis not present

## 2018-09-11 DIAGNOSIS — M86471 Chronic osteomyelitis with draining sinus, right ankle and foot: Secondary | ICD-10-CM | POA: Diagnosis not present

## 2018-09-11 DIAGNOSIS — N186 End stage renal disease: Secondary | ICD-10-CM

## 2018-09-11 DIAGNOSIS — R7881 Bacteremia: Secondary | ICD-10-CM | POA: Diagnosis not present

## 2018-09-11 NOTE — Assessment & Plan Note (Signed)
Cleared prior to leaving the hospital, however source of infection remains and she is at high risk for recurrent bacteremia pending amputation. No systemic symptoms at present. Continue current dose of vancomycin for osteomyelitis.

## 2018-09-11 NOTE — Patient Instructions (Signed)
Nice to see you.  We will continue your vancomycin with dialysis.  Check inflammatory markers with blood work.  Continue wound care per [name] and Dr. Dellia Nims.   Follow up with ID as needed pending your amputation.

## 2018-09-11 NOTE — Assessment & Plan Note (Signed)
Continues to receive vancomycin for osteomyelitis of the right foot of multiple bones. Has completed about 6 weeks of therapy however noted to have subtherapeutic levels recently and will plan to extend treatment an additional 3 weeks. Will ask nephology do draw inflammatory markers. Agree with Podiatry and Vascular Surgery that this is beyond salvagable and Ms. Carbonell is amenable to amputation which she is pursuing in [place].

## 2018-09-11 NOTE — Telephone Encounter (Signed)
Last office note faxed to new fax number below.

## 2018-09-11 NOTE — Progress Notes (Signed)
Subjective:    Patient ID: [name], female    DOB: 1971/02/24, 48 y.o.   MRN: [id number]  Chief Complaint  Patient presents with  . Osteomyelitis      HPI:  [name] is a 48 y.o. female with Type 1 diabetes complicated by neuropathy, diabetic ulcers and nephropathy was recently hospitalized with diabetic foot infection and osteomyelitis of multiple bones and underwent I&D of multiple areas of infection. Osteomyelitis was complicated by MRSA bacteremia with her right foot being the likely source of infection. Echocardiogram negative for vegetation with preserved heart valve function. Plan of treatment was for 6 weeks of vancomycin with dialysis. All hospital labs, records, and imaging were reviewed in detail. Since hospitalization she has been seen by podiatry with the continued recommendation of BKA.   Ms. Farrey has been receiving her vancomycin with dialysis without adverse side effects. Continues to have drainage from her right foot with redness. No systemic symptoms of fevers, chills or sweats. Area remains numb with no pain.    Allergies  Allergen Reactions  . Cleocin [Clindamycin Hcl] Diarrhea  . Lisinopril Other (See Comments)    Elevated potassium per pt report  . Amoxicillin Diarrhea, Nausea Only and Other (See Comments)    Has patient had a PCN reaction causing immediate rash, facial/tongue/throat swelling, SOB or lightheadedness with hypotension: No Has patient had a PCN reaction causing severe rash involving mucus membranes or skin necrosis: No Has patient had a PCN reaction that required hospitalization: No Has patient had a PCN reaction occurring within the last 10 years: No If all of the above answers are "NO", then may proceed with Cephalosporin use.   . Bactrim [Sulfamethoxazole-Trimethoprim] Diarrhea and Nausea Only      Outpatient Medications Prior to Visit  Medication Sig Dispense Refill  . albuterol (PROVENTIL HFA;VENTOLIN HFA) 108 (90  BASE) MCG/ACT inhaler Inhale 2 puffs into the lungs every 6 (six) hours as needed for wheezing or shortness of breath. Reported on 04/15/2015    . amLODipine (NORVASC) 5 MG tablet Take 1 tablet (5 mg total) by mouth daily. (Patient taking differently: Take 5 mg by mouth every evening. ) 30 tablet 0  . aspirin 81 MG tablet Take 81 mg by mouth every evening.     . BD PEN NEEDLE NANO U/F 32G X 4 MM MISC USE AS DIRECTED TWICE A DAY 100 each 1  . calcitRIOL (ROCALTROL) 0.5 MCG capsule Take 1 capsule daily (Patient taking differently: Take 0.5 mcg by mouth daily. ) 90 capsule 2  . calcium acetate (PHOSLO) 667 MG capsule Take 2 capsules (1,334 mg total) by mouth 3 (three) times daily with meals. 180 capsule 0  . CINNAMON PO Take 1 tablet by mouth every evening.     . Continuous Blood Gluc Receiver (FREESTYLE LIBRE READER) DEVI 1 Device by Does not apply route as directed. 1 Device 1  . Continuous Blood Gluc Sensor (FREESTYLE LIBRE SENSOR SYSTEM) MISC Use 1 sensor every 10 days 3 each 4  . folic acid (FOLVITE) 1 MG tablet Take 1 mg by mouth every evening.     Marland Kitchen glucose blood test strip Use Bayer Contour test strips as instructed to check blood sugar 5 times daily. 150 each 3  . HUMALOG 100 UNIT/ML injection CALL MD IF <70, IF 151-200 =2UNITS,201-250 =4 UNITS,251-300 =6 UNITS, 301-350 =8 UNITS,351-400 =10 U (Patient taking differently: INSULIN PUMP) 30 mL 0  . pravastatin (PRAVACHOL) 40 MG tablet TAKE 1 TABLET BY  MOUTH EVERY DAY (Patient taking differently: Take 40 mg by mouth daily. ) 90 tablet 1  . pyridoxine (B-6) 100 MG tablet Take 100 mg by mouth daily. Reported on 04/15/2015    . TURMERIC PO Take 1 capsule by mouth daily.    . vancomycin (VANCOCIN) 500-5 MG/100ML-% IVPB Inject 100 mLs (500 mg total) into the vein every Monday, Wednesday, and Friday with hemodialysis. 100 mL   . Vitamin D, Ergocalciferol, (DRISDOL) 50000 units CAPS capsule TAKE ONE CAPSULE BY MOUTH ONE TIME PER WEEK 12 capsule 0   No  facility-administered medications prior to visit.      Past Medical History:  Diagnosis Date  . Anemia   . Arthritis   . Bronchitis   . C. difficile diarrhea 09/26/2014  . Cataracts, both eyes   . Cellulitis of right foot 06/02/2014  . CKD (chronic kidney disease) stage 3, GFR 30-59 ml/[name] (HCC)    sees Dr Justin Mend- Stage IV  . Diabetic ulcer of right foot ([place]) 06/02/2014  . DVT (deep venous thrombosis) ([place]) 10/2014   one in each one in leg- PICC line , one in right and left arm  . H/O seasonal allergies   . Heart murmur    told once when she was pregnant- no furter mention- was in notes 11/2015- had Echo 8 /4/17  . History of blood transfusion   . Hypercholesteremia   . Hypertension   . Left foot infection   . Neuropathy    feet  . Neuropathy in diabetes ([place])   . Peripheral vascular disease ([place])   . Sleep apnea    not able to use CPAP  . Staphylococcus aureus bacteremia 10/2014  . Type 1 diabetes ([place])    onset age 65     Past Surgical History:  Procedure Laterality Date  . AMPUTATION TOE Left 07/24/2015   5th toe and Hallux amputation  . AV FISTULA PLACEMENT Left 08/06/2018   Procedure: INSERTION OF BASCILIC VEN TRANSPOSITION 1ST STAGE;  Surgeon: Rosetta Posner, MD;  Location: [facility];  Service: Vascular;  Laterality: Left;  . CESAREAN SECTION    . EXOSTECTECTOMY TOE Left 04/11/2016   Procedure: EXOSTECTECTOMY CUBOID AND 5TH METATARSAL AND PLACEMENT OF ANTIBIOTIC BEADS;  Surgeon: Edrick Kins, DPM;  Location: [facility];  Service: Podiatry;  Laterality: Left;  . EYE SURGERY Bilateral    [place]  . FEMORAL-POPLITEAL BYPASS GRAFT Left 05/03/2015  . FOOT AMPUTATION THROUGH METATARSAL Right 2016  . hemrrhoidectomy    . I&D EXTREMITY Right 06/10/2014   Procedure: IRRIGATION AND DEBRIDEMENT Right Foot;  Surgeon: Newt Minion, MD;  Location: [facility];  Service: Orthopedics;  Laterality: Right;  . I&D EXTREMITY Right 07/31/2018   Procedure: IRRIGATION AND DEBRIDEMENT EXTREMITY;  Surgeon:  Edrick Kins, DPM;  Location: [facility];  Service: Podiatry;  Laterality: Right;  . I&D EXTREMITY Right 08/04/2018   Procedure: IRRIGATION AND DEBRIDEMENT FOOT with placement of wound vac;  Surgeon: Edrick Kins, DPM;  Location: [place];  Service: Podiatry;  Laterality: Right;  . INCISION AND DRAINAGE Left 04/11/2016   Procedure: INCISION AND DRAINAGE A DEEP COMPLICATED WOUND OF LEFT FOOT;  Surgeon: Edrick Kins, DPM;  Location: [facility];  Service: Podiatry;  Laterality: Left;  . INSERTION OF DIALYSIS CATHETER Right 08/06/2018   Procedure: INSERTION OF TUNNELED DIALYSIS CATHETER;  Surgeon: Rosetta Posner, MD;  Location: [facility];  Service: Vascular;  Laterality: Right;  . IR FLUORO GUIDE CV LINE RIGHT  09/24/2017  .  IR FLUORO GUIDE CV LINE RIGHT  08/01/2018  . IR REMOVAL TUN CV CATH W/O FL  11/22/2017  . IR US GUIDE VASC ACCESS RIGHT  09/24/2017  . IR US GUIDE VASC ACCESS RIGHT  08/01/2018  . IRRIGATION AND DEBRIDEMENT ABSCESS Left 05/06/2017   Procedure: IRRIGATION AND DEBRIDEMENT ABSCESS LEFT FOOT;  Surgeon: Edrick Kins, DPM;  Location: [facility];  Service: Podiatry;  Laterality: Left;  . PERIPHERALLY INSERTED CENTRAL CATHETER INSERTION    . SKIN GRAFT Right 06/15/2014  . SKIN GRAFT Left 06/2015   foot   . SKIN SPLIT GRAFT Right 06/15/2014   Procedure: SPLIT THICKNESS SKIN GRAFT RIGHT FOOT;  Surgeon: Newt Minion, MD;  Location: [facility];  Service: Orthopedics;  Laterality: Right;  . TRANSMETATARSAL AMPUTATION Right   . TRANSMETATARSAL AMPUTATION Right 09/11/2014    Review of Systems  Constitutional: Negative for chills and fever.  Respiratory: Negative for chest tightness, shortness of breath and wheezing.   Cardiovascular: Negative for chest pain.  Gastrointestinal: Negative for abdominal pain, constipation, diarrhea, nausea and vomiting.  Skin: Positive for wound.      Objective:    BP 118/73   Pulse 98   Temp 99.6 F (37.6 C)  Nursing note and vital signs reviewed.  Physical Exam  Constitutional:      General: She is not in acute distress.    Appearance: She is well-developed.     Comments: Seated in the wheelchair; pleasant.   Cardiovascular:     Rate and Rhythm: Normal rate and regular rhythm.     Heart sounds: Normal heart sounds. No murmur.  Pulmonary:     Effort: Pulmonary effort is normal.     Breath sounds: Normal breath sounds.  Skin:    General: Skin is warm and dry.  Neurological:     Mental Status: She is alert and oriented to person, place, and time.  Psychiatric:        Behavior: Behavior normal.        Thought Content: Thought content normal.        Judgment: Judgment normal.         Right Foot     Assessment & Plan:   Problem List Items Addressed This Visit      Endocrine   DM type 1 causing renal disease (HCC) (Chronic)    Type 1 diabetes with multiple complications ESRD and now on chronic dialysis as well as diabetic ulcer. Continues to work with Dr. Dwyane Dee to control blood sugars. Most recent A1c of 10.4. Continue management per endocrinology.         Musculoskeletal and Integument   Osteomyelitis of right foot ([place]) - Primary    Continues to receive vancomycin for osteomyelitis of the right foot of multiple bones. Has completed about 6 weeks of therapy however noted to have subtherapeutic levels recently and will plan to extend treatment an additional 3 weeks. Will ask nephology do draw inflammatory markers. Agree with Podiatry and Vascular Surgery that this is beyond salvagable and Ms. Kimmons is amenable to amputation which she is pursuing in [place].         Other   MRSA bacteremia    Cleared prior to leaving the hospital, however source of infection remains and she is at high risk for recurrent bacteremia pending amputation. No systemic symptoms at present. Continue current dose of vancomycin for osteomyelitis.           I am having [name] "Leah" maintain her  albuterol, pyridoxine, aspirin, folic acid,  CINNAMON PO, FreeStyle Libre Reader, [employer], amLODipine, calcitRIOL, Vitamin D (Ergocalciferol), HumaLOG, BD Pen Needle Nano U/F, pravastatin, TURMERIC PO, vancomycin, calcium acetate, and glucose blood.   Follow-up: Return if symptoms worsen or fail to improve.   Terri Piedra, MSN, FNP-C Nurse Practitioner [facility] for Infectious Disease [place] number: [phone]

## 2018-09-11 NOTE — Telephone Encounter (Signed)
[employer] and spoke with representative names Kathlee Nations. She stated that the division of [facility] that handles chart notes is the provider relations department and their fax number is [phone]. Chart notes will be faxed to this number.

## 2018-09-11 NOTE — Telephone Encounter (Signed)
Patient called re: patient wants to know if Chart notes have sent to [facility]. [facility] told patient that they have not received anything from our office. Patient spoke with [facility] today. Please call patient at ph# [phone] to advise.

## 2018-09-11 NOTE — Assessment & Plan Note (Signed)
Type 1 diabetes with multiple complications ESRD and now on chronic dialysis as well as diabetic ulcer. Continues to work with Dr. Dwyane Dee to control blood sugars. Most recent A1c of 10.4. Continue management per endocrinology.

## 2018-09-12 ENCOUNTER — Other Ambulatory Visit: Payer: Self-pay

## 2018-09-12 ENCOUNTER — Other Ambulatory Visit (HOSPITAL_COMMUNITY)
Admission: RE | Admit: 2018-09-12 | Discharge: 2018-09-12 | Disposition: A | Discharge status: Home / Self Care | Payer: Medicare HMO | Source: Other Acute Inpatient Hospital | Attending: Internal Medicine | Admitting: Internal Medicine

## 2018-09-12 DIAGNOSIS — E10621 Type 1 diabetes mellitus with foot ulcer: Secondary | ICD-10-CM | POA: Diagnosis not present

## 2018-09-12 DIAGNOSIS — N185 Chronic kidney disease, stage 5: Secondary | ICD-10-CM

## 2018-09-12 DIAGNOSIS — L97514 Non-pressure chronic ulcer of other part of right foot with necrosis of bone: Secondary | ICD-10-CM | POA: Diagnosis present

## 2018-09-12 DIAGNOSIS — E11621 Type 2 diabetes mellitus with foot ulcer: Secondary | ICD-10-CM | POA: Insufficient documentation

## 2018-09-12 NOTE — Progress Notes (Signed)
   HPI: 48 year old female with PMHx of T1DM presenting today for follow up evaluation of an ulceration of the right foot with osteomyelitis. She states she put the wound vac on last night. She reports the wound center recommended a BKA but patient does not want to do it. She has been applying a new dressing to the wound daily. There are no modifying factors noted. Patient is here for further evaluation and treatment.   Past Medical History:  Diagnosis Date  . Anemia   . Arthritis   . Bronchitis   . C. difficile diarrhea 09/26/2014  . Cataracts, both eyes   . Cellulitis of right foot 06/02/2014  . CKD (chronic kidney disease) stage 3, GFR 30-59 ml/min (HCC)    sees Dr Justin Mend- Stage IV  . Diabetic ulcer of right foot ([place]) 06/02/2014  . DVT (deep venous thrombosis) ([place]) 10/2014   one in each one in leg- PICC line , one in right and left arm  . H/O seasonal allergies   . Heart murmur    told once when she was pregnant- no furter mention- was in notes 11/2015- had Echo 8 /4/17  . History of blood transfusion   . Hypercholesteremia   . Hypertension   . Left foot infection   . Neuropathy    feet  . Neuropathy in diabetes ([place])   . Peripheral vascular disease ([place])   . Sleep apnea    not able to use CPAP  . Staphylococcus aureus bacteremia 10/2014  . Type 1 diabetes ([place])    onset age 35       Physical Exam: General: The patient is alert and oriented x3 in no acute distress.  Patient is actually in much better spirits today as compared to inpatient.  Wound noted to the right foot measures approximately 12.06.0 cm (LxW).  Wound is mostly fibrotic with portions of necrotic bone exposed.  There is also exposed joint.  Very focal small amounts of granular tissue noted proximally.  Moderate serous drainage noted.  No malodor.  Periwound integrity is very erythematous.  Assessment: 1. CKD currently on dialysis 3x/week 2. T1DM - poorly controlled 3. Osteomyelitis right foot 4. Ulcer right  foot 5. History of partial foot amputations bilateral   Plan of Care:  1. Patient evaluated.   2. Referral placed to [facility] for surgery consult of BKA of the RLE.  3. Continue applying dry sterile dressing daily.  4. Patient is now amenable to BKA.  5. Return to clinic as needed.      Edrick Kins, DPM [facility]  Dr. Edrick Kins, DPM    [address], [place] [address]                Office [phone]  Fax [phone]

## 2018-09-15 ENCOUNTER — Telehealth (HOSPITAL_COMMUNITY): Payer: Self-pay | Admitting: Rehabilitation

## 2018-09-15 NOTE — Telephone Encounter (Signed)
The above patient or their representative was contacted and gave the following answers to these questions:         Do you have any of the following symptoms? No Fever                    Cough                   Shortness of breath  Do  you have any of the following other symptoms? No  muscle pain         vomiting,        diarrhea        rash         weakness        red eye        abdominal pain         bruising         bleeding              joint pain           severe headache  Have you been in contact with someone who was or has been sick in the past 2 weeks? No Yes                 Unsure                         Unable to assess   Does the person that you were in contact with have any of the following symptoms?  Cough         shortness of breath           muscle pain         vomiting,            diarrhea            rash            weakness           fever            red eye           abdominal pain          bruising  or  bleeding                joint pain                severe headache             Have you  or someone you have been in contact with traveled internationally in the last month?  No      If yes, which countries?  Have you  or someone you have been in contact with traveled outside [place] in the last month?  No      If yes, which state and city?  COMMENTS OR ACTION PLAN FOR THIS PATIENT:    

## 2018-09-16 ENCOUNTER — Encounter: Payer: Self-pay | Admitting: Vascular Surgery

## 2018-09-16 ENCOUNTER — Ambulatory Visit (INDEPENDENT_AMBULATORY_CARE_PROVIDER_SITE_OTHER): Payer: Self-pay | Admitting: Vascular Surgery

## 2018-09-16 ENCOUNTER — Other Ambulatory Visit: Payer: Self-pay

## 2018-09-16 ENCOUNTER — Ambulatory Visit (HOSPITAL_COMMUNITY)
Admission: RE | Admit: 2018-09-16 | Discharge: 2018-09-16 | Disposition: A | Discharge status: Home / Self Care | Payer: Medicare HMO | Source: Ambulatory Visit | Attending: Vascular Surgery | Admitting: Vascular Surgery

## 2018-09-16 VITALS — BP 150/85 | HR 90 | Temp 97.4°F | Resp 20 | Ht 68.0 in | Wt 124.0 lb

## 2018-09-16 DIAGNOSIS — N186 End stage renal disease: Secondary | ICD-10-CM

## 2018-09-16 DIAGNOSIS — N185 Chronic kidney disease, stage 5: Secondary | ICD-10-CM | POA: Insufficient documentation

## 2018-09-16 DIAGNOSIS — Z992 Dependence on renal dialysis: Secondary | ICD-10-CM

## 2018-09-16 LAB — AEROBIC CULTURE W GRAM STAIN (SUPERFICIAL SPECIMEN)

## 2018-09-16 LAB — AEROBIC CULTURE? (SUPERFICIAL SPECIMEN)

## 2018-09-16 NOTE — Progress Notes (Signed)
Patient name: [name] MRN: [id number] DOB: 02-Jan-1971 Sex: female  REASON FOR VISIT: Follow-up left for stage basilic vein fistula  HPI: [name] is a 48 y.o. female here today for follow-up.  Had for stage brachiobasilic fistula on 11/04/5362 while inpatient.  Also had placement of right IJ catheter at that time.  She is a continue to deal with gangrenous changes of her foot and is planning to go to [place] for some type of partial foot amputation  Current Outpatient Medications  Medication Sig Dispense Refill   albuterol (PROVENTIL HFA;VENTOLIN HFA) 108 (90 BASE) MCG/ACT inhaler Inhale 2 puffs into the lungs every 6 (six) hours as needed for wheezing or shortness of breath. Reported on 04/15/2015     amLODipine (NORVASC) 5 MG tablet Take 1 tablet (5 mg total) by mouth daily. (Patient taking differently: Take 5 mg by mouth every evening. ) 30 tablet 0   aspirin 81 MG tablet Take 81 mg by mouth every evening.      BD PEN NEEDLE NANO U/F 32G X 4 MM MISC USE AS DIRECTED TWICE A DAY 100 each 1   calcitRIOL (ROCALTROL) 0.5 MCG capsule Take 1 capsule daily (Patient taking differently: Take 0.5 mcg by mouth daily. ) 90 capsule 2   calcium acetate (PHOSLO) 667 MG capsule Take 2 capsules (1,334 mg total) by mouth 3 (three) times daily with meals. 180 capsule 0   CINNAMON PO Take 1 tablet by mouth every evening.      Continuous Blood Gluc Receiver (FREESTYLE LIBRE READER) DEVI 1 Device by Does not apply route as directed. 1 Device 1   Continuous Blood Gluc Sensor (FREESTYLE LIBRE SENSOR SYSTEM) MISC Use 1 sensor every 10 days 3 each 4   folic acid (FOLVITE) 1 MG tablet Take 1 mg by mouth every evening.      glucose blood test strip Use Bayer Contour test strips as instructed to check blood sugar 5 times daily. 150 each 3   HUMALOG 100 UNIT/ML injection CALL MD IF <70, IF 151-200 =2UNITS,201-250 =4 UNITS,251-300 =6 UNITS, 301-350 =8  UNITS,351-400 =10 U (Patient taking differently: INSULIN PUMP) 30 mL 0   pravastatin (PRAVACHOL) 40 MG tablet TAKE 1 TABLET BY MOUTH EVERY DAY (Patient taking differently: Take 40 mg by mouth daily. ) 90 tablet 1   pyridoxine (B-6) 100 MG tablet Take 100 mg by mouth daily. Reported on 04/15/2015     TURMERIC PO Take 1 capsule by mouth daily.     vancomycin (VANCOCIN) 500-5 MG/100ML-% IVPB Inject 100 mLs (500 mg total) into the vein every Monday, Wednesday, and Friday with hemodialysis. 100 mL    Vitamin D, Ergocalciferol, (DRISDOL) 50000 units CAPS capsule TAKE ONE CAPSULE BY MOUTH ONE TIME PER WEEK 12 capsule 0   No current facility-administered medications for this visit.      PHYSICAL EXAM: Vitals:   09/16/18 1005  BP: (!) 150/85  Pulse: 90  Resp: 20  Temp: (!) 97.4 F (36.3 C)  SpO2: 100%  Weight: 56.2 kg  Height: 5\' 8"  (1.727 m)    GENERAL: The patient is a well-nourished female, in no acute distress. The vital signs are documented above.  Thrill with good healing of her left antecubital incision.  Duplex today shows elevated velocities at the anastomosis.  May be related to configuration.  Does have an excellent thrill and 5 mm vein throughout the upper arm  MEDICAL ISSUES: Discussed this with patient.  Explained that she will now  will need a second stage transposition.  She will coordinate this around her planned partial foot amputation in [place].  I discussed the procedure as an outpatient and also discussed potential for non-maturation.  Explained that she would require a total of 3 months of maturation before being able to use her fistula.  She will call us when she wishes to proceed with scheduling second stage basilic vein transposition   Nancy Posner, MD [facility] Tel [phone] Pager [phone]

## 2018-09-17 ENCOUNTER — Other Ambulatory Visit: Payer: Self-pay

## 2018-09-17 MED ORDER — GLUCOSE BLOOD VI STRP: ORAL_STRIP | 3 refills | Status: DC

## 2018-09-18 ENCOUNTER — Telehealth: Payer: Self-pay

## 2018-09-18 NOTE — Telephone Encounter (Signed)
Spoke with Dialysis NP and will continue vancomycin with dosing per vancomycin levels with dialysis. She will need continued vancomycin at this point as her levels have been subtherapeutic. While the goal is not a cure it is suppressive pending her amputation.

## 2018-09-18 NOTE — Telephone Encounter (Addendum)
Patient called from Dialysis and states she is confused on her antibiotic therapy. Patient made aware that she is to continue her therapy for an additional 3 weeks. Confirmed with provider Terri Piedra, NP. Confirmed dosage with [facility] pharmacy LPN called [facility] and spoke with Amy Gave verbal orders to continue Vancomycin 750mg  IV every M-W-F (Hemodialysis) for additional 3 weeks  And recheck vanc trough level next week (Tuesday). Verbal order read back and understood.  Eugenia Mcalpine, LPN

## 2018-09-19 ENCOUNTER — Ambulatory Visit (HOSPITAL_COMMUNITY): Payer: Self-pay

## 2018-09-19 DIAGNOSIS — E10621 Type 1 diabetes mellitus with foot ulcer: Secondary | ICD-10-CM | POA: Diagnosis not present

## 2018-09-19 DIAGNOSIS — N186 End stage renal disease: Secondary | ICD-10-CM

## 2018-09-26 ENCOUNTER — Other Ambulatory Visit: Payer: Self-pay

## 2018-09-26 ENCOUNTER — Ambulatory Visit (HOSPITAL_COMMUNITY)
Admission: RE | Admit: 2018-09-26 | Discharge: 2018-09-26 | Disposition: A | Discharge status: Home / Self Care | Payer: Medicare HMO | Source: Ambulatory Visit | Attending: Nurse Practitioner | Admitting: Nurse Practitioner

## 2018-09-26 DIAGNOSIS — N186 End stage renal disease: Secondary | ICD-10-CM | POA: Insufficient documentation

## 2018-09-26 DIAGNOSIS — D649 Anemia, unspecified: Secondary | ICD-10-CM | POA: Diagnosis not present

## 2018-09-26 DIAGNOSIS — E10621 Type 1 diabetes mellitus with foot ulcer: Secondary | ICD-10-CM | POA: Diagnosis not present

## 2018-09-26 LAB — PREPARE RBC (CROSSMATCH)

## 2018-09-26 MED ORDER — SODIUM CHLORIDE 0.9% IV SOLUTION
Freq: Once | INTRAVENOUS | Status: AC
  Administered 2018-09-26: 11:00:00 via INTRAVENOUS

## 2018-09-26 NOTE — Progress Notes (Signed)
                   Patient Care Center Note   Diagnosis: Anemia    Provider: Valentina Gu NP     Procedure: 1 unit PRBC    Note: [name] came to [facility] for 1 unit of PRBC via PIV and tolerated well. VItals stable. Discharge insturcitons given. Patient alert and oriented at time of discharge. Discharged via wheelchair.  Otho Bellows, RN

## 2018-09-26 NOTE — Discharge Instructions (Signed)
Blood Transfusion, Adult  A blood transfusion is a procedure in which you receive donated blood, including plasma, platelets, and red blood cells, through an IV tube. You may need a blood transfusion because of illness, surgery, or injury. The blood may come from a donor. You may also be able to donate blood for yourself (autologous blood donation) before a surgery if you know that you might require a blood transfusion. The blood given in a transfusion is made up of different types of cells. You may receive:  Red blood cells. These carry oxygen to the cells in the body.  White blood cells. These help you fight infections.  Platelets. These help your blood to clot.  Plasma. This is the liquid part of your blood and it helps with fluid imbalances. If you have hemophilia or another clotting disorder, you may also receive other types of blood products. Tell a health care provider about:  Any allergies you have.  All medicines you are taking, including vitamins, herbs, eye drops, creams, and over-the-counter medicines.  Any problems you or family members have had with anesthetic medicines.  Any blood disorders you have.  Any surgeries you have had.  Any medical conditions you have, including any recent fever or cold symptoms.  Whether you are pregnant or may be pregnant.  Any previous reactions you have had during a blood transfusion. What are the risks? Generally, this is a safe procedure. However, problems may occur, including:  Having an allergic reaction to something in the donated blood. Hives and itching may be symptoms of this type of reaction.  Fever. This may be a reaction to the white blood cells in the transfused blood. Nausea or chest pain may accompany a fever.  Iron overload. This can happen from having many transfusions.  Transfusion-related acute lung injury (TRALI). This is a rare reaction that causes lung damage. The cause is not known.TRALI can occur within hours  of a transfusion or several days later.  Sudden (acute) or delayed hemolytic reactions. This happens if your blood does not match the cells in your transfusion. Your body's defense system (immune system) may try to attack the new cells. This complication is rare. The symptoms include fever, chills, nausea, and low back pain or chest pain.  Infection or disease transmission. This is rare. What happens before the procedure?  You will have a blood test to determine your blood type. This is necessary to know what kind of blood your body will accept and to match it to the donor blood.  If you are going to have a planned surgery, you may be able to do an autologous blood donation. This may be done in case you need to have a transfusion.  If you have had an allergic reaction to a transfusion in the past, you may be given medicine to help prevent a reaction. This medicine may be given to you by mouth or through an IV tube.  You will have your temperature, blood pressure, and pulse monitored before the transfusion.  Follow instructions from your health care provider about eating and drinking restrictions.  Ask your health care provider about: ? Changing or stopping your regular medicines. This is especially important if you are taking diabetes medicines or blood thinners. ? Taking medicines such as aspirin and ibuprofen. These medicines can thin your blood. Do not take these medicines before your procedure if your health care provider instructs you not to. What happens during the procedure?  An IV tube will be   inserted into one of your veins.  The bag of donated blood will be attached to your IV tube. The blood will then enter through your vein.  Your temperature, blood pressure, and pulse will be monitored regularly during the transfusion. This monitoring is done to detect early signs of a transfusion reaction.  If you have any signs or symptoms of a reaction, your transfusion will be stopped and  you may be given medicine.  When the transfusion is complete, your IV tube will be removed.  Pressure may be applied to the IV site for a few minutes.  A bandage (dressing) will be applied. The procedure may vary among health care providers and hospitals. What happens after the procedure?  Your temperature, blood pressure, heart rate, breathing rate, and blood oxygen level will be monitored often.  Your blood may be tested to see how you are responding to the transfusion.  You may be warmed with fluids or blankets to maintain a normal body temperature. Summary  A blood transfusion is a procedure in which you receive donated blood, including plasma, platelets, and red blood cells, through an IV tube.  Your temperature, blood pressure, and pulse will be monitored before, during, and after the transfusion.  Your blood may be tested after the transfusion to see how your body has responded. This information is not intended to replace advice given to you by your health care provider. Make sure you discuss any questions you have with your health care provider. Document Released: 03/16/2000 Document Revised: 02/03/2018 Document Reviewed: 12/15/2015 Elsevier Patient Education  2020 Elsevier Inc.  

## 2018-09-27 LAB — TYPE AND SCREEN
ABO/RH(D): AB NEG
Antibody Screen: NEGATIVE
Unit division: 0

## 2018-09-27 LAB — BPAM RBC
Blood Product Expiration Date: 202007142359
ISSUE DATE / TIME: 202006261037
Unit Type and Rh: 1700

## 2018-09-29 IMAGING — DX DG FOOT COMPLETE 3+V*L*
3 series · 3 of 3 positions shown · non-contrast
Comparison: Numerous priors dating back through 10/15/2014.

CLINICAL DATA: New blister

EXAM:
LEFT FOOT - COMPLETE 3+ VIEW

[foot ap]
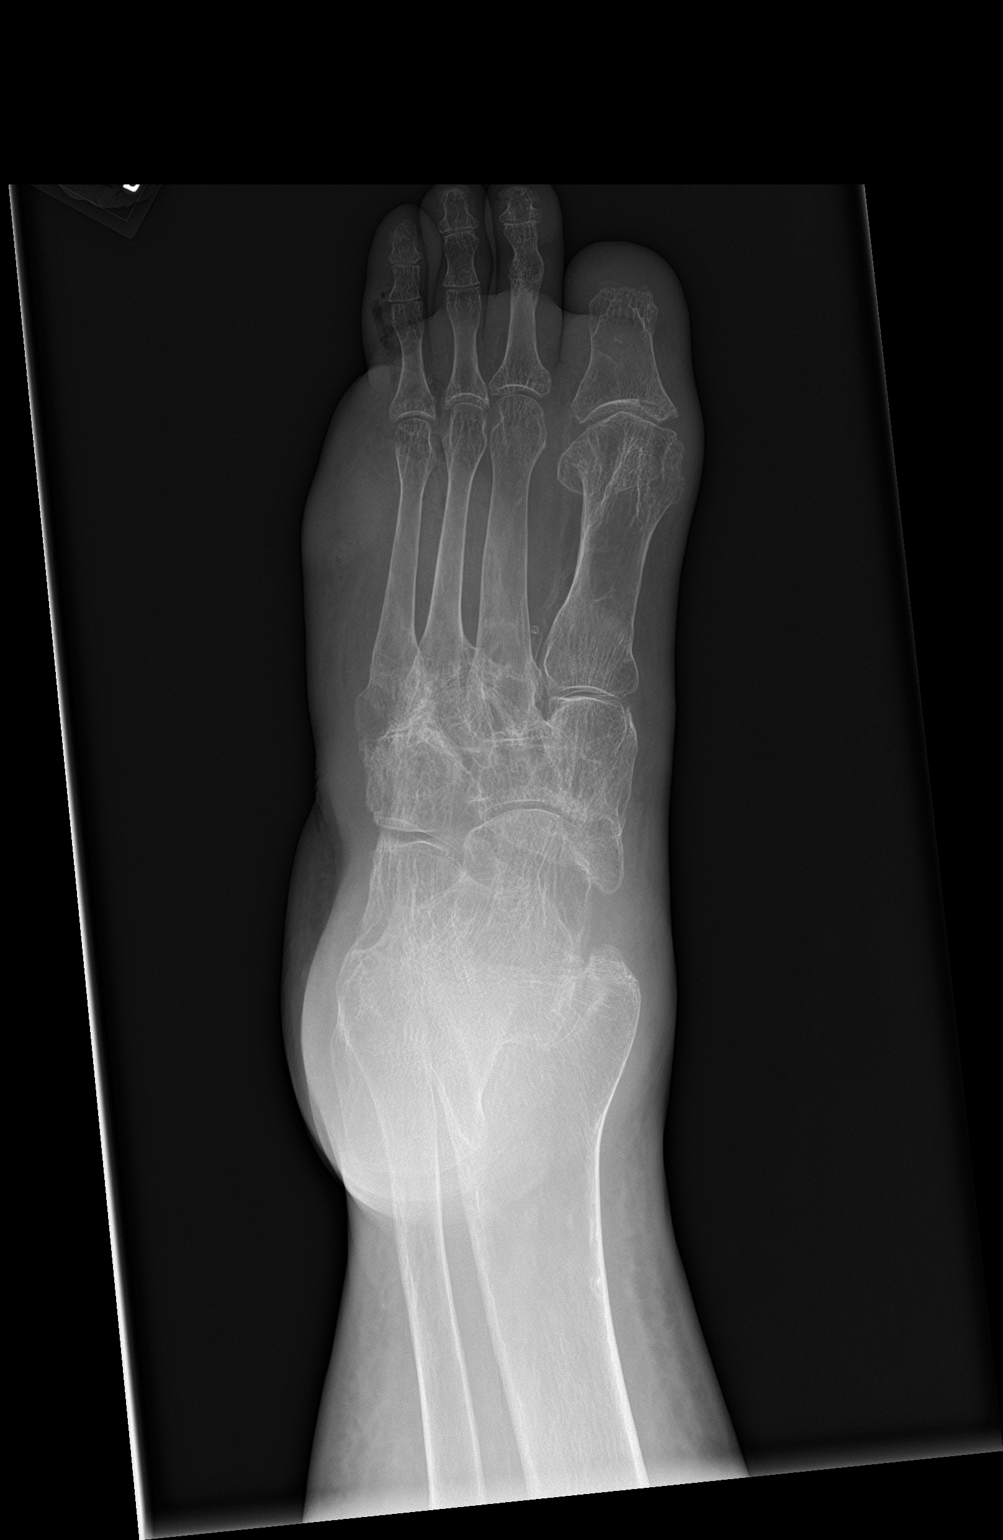

[foot obl]
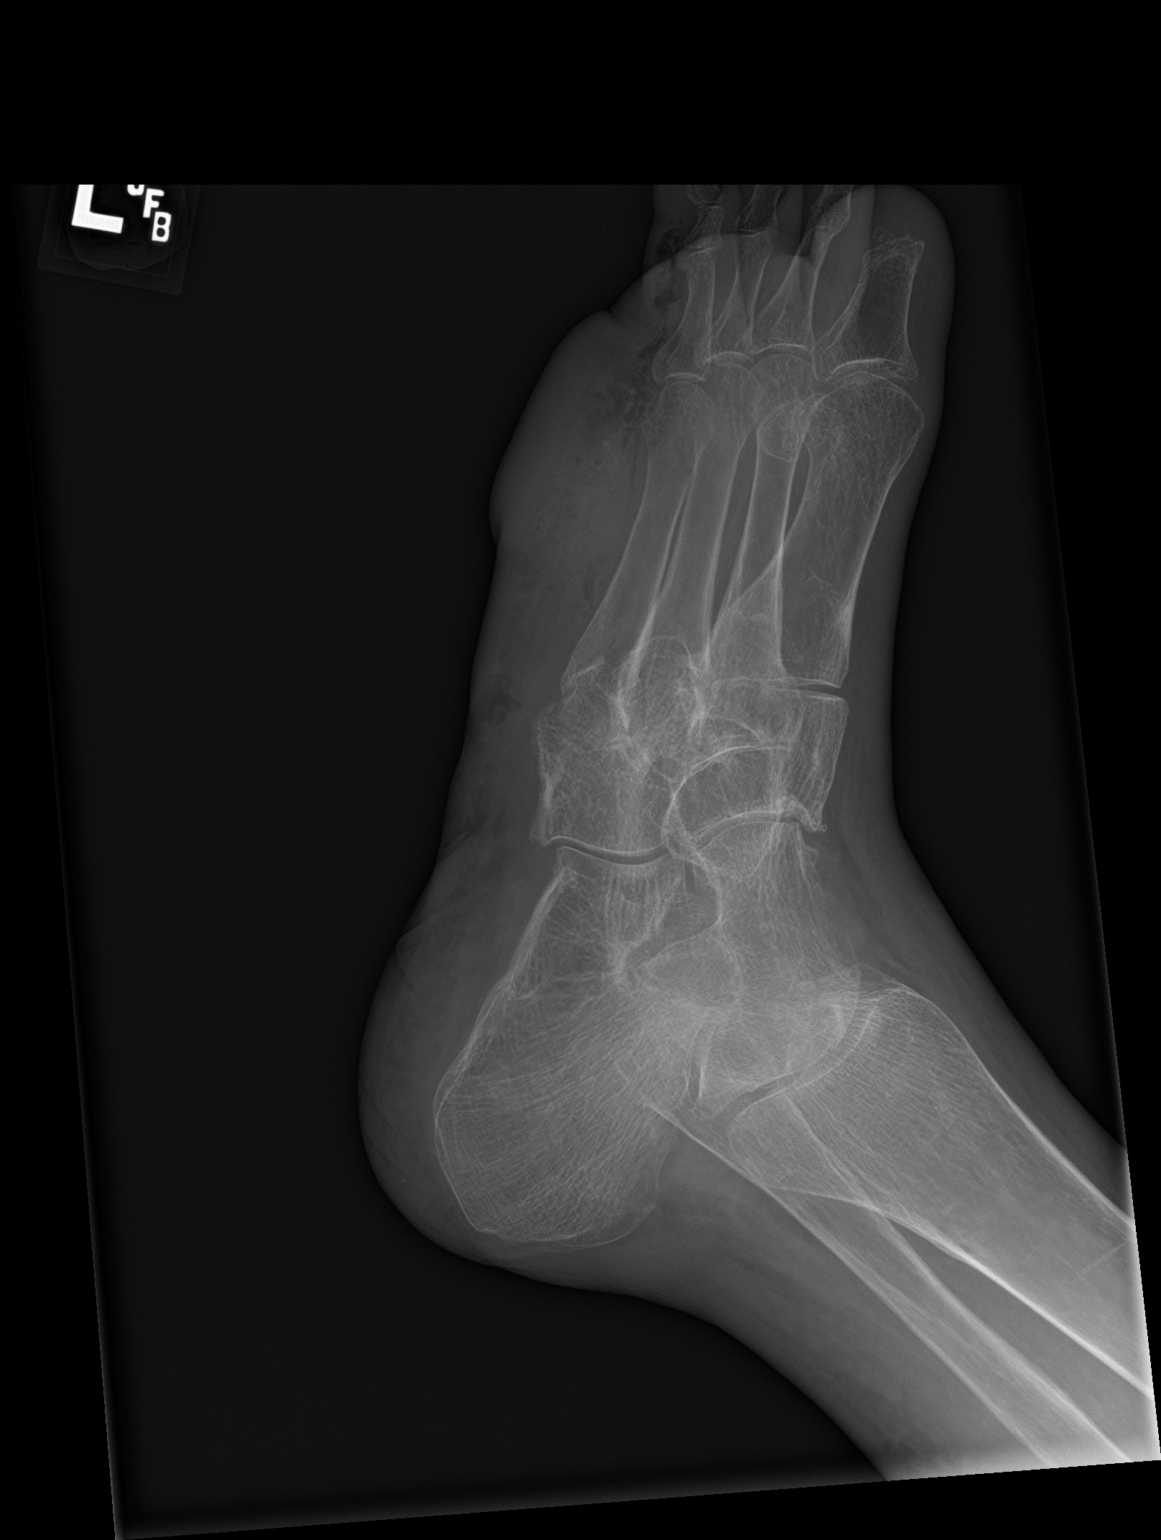

[foot lat]
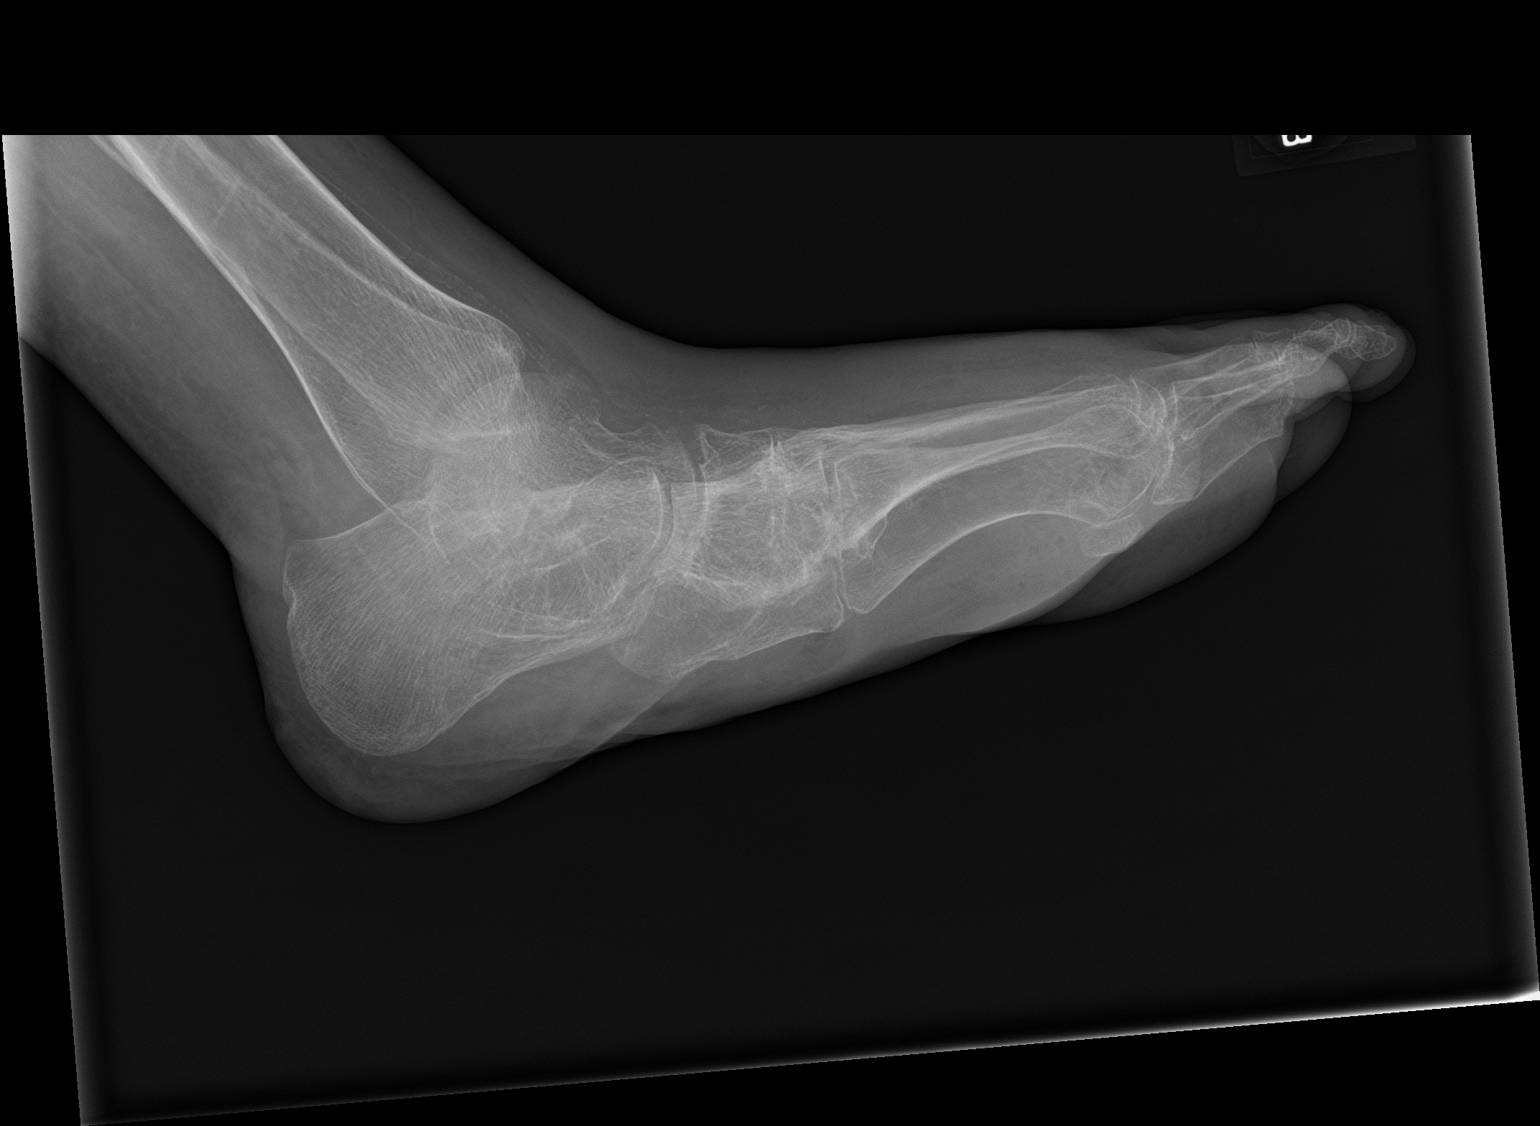

[3 of 3 positions shown; findings below may reference images not displayed]

FINDINGS: Diffuse osteopenia of the left foot with slight [place]
appearance of the midfoot on the lateral view. Chronic amputation of
the distal phalanx of the great toe as well as the fifth ray. Soft
tissue swelling is seen along the lateral aspect of the forefoot
with soft tissue emphysema at the base of the fourth toe. There is a
soft tissue ulceration along the plantar lateral aspect of the
midfoot. No loss of cortical margins to suggest osteomyelitis.
Midfoot osteoarthritis is stable. Bony protuberance along the
posteromedial aspect of the navicular is also stable. Generalized
soft tissue swelling about the malleoli and included distal leg. No
acute fracture or suspicious osseous lesions.
IMPRESSION: 1. Soft tissue ulceration along the plantar lateral aspect of the
midfoot with soft tissue emphysema along the plantar lateral aspect
of the forefoot and extending along the fourth digit.
2. No radiographic manifestation of osteomyelitis.
3. Chronic stable midfoot osteoarthritis and generalized diffuse
osteopenia.
4. Chronic amputation of the fifth ray and distal phalanx of the
great toe.

## 2018-09-30 DIAGNOSIS — E10621 Type 1 diabetes mellitus with foot ulcer: Secondary | ICD-10-CM | POA: Diagnosis not present

## 2018-09-30 LAB — GLUCOSE, CAPILLARY
Glucose-Capillary: 374 mg/dL — ABNORMAL HIGH (ref 70–99)
Glucose-Capillary: 336 mg/dL — ABNORMAL HIGH (ref 70–99)

## 2018-10-01 ENCOUNTER — Encounter (HOSPITAL_BASED_OUTPATIENT_CLINIC_OR_DEPARTMENT_OTHER): Payer: Medicare HMO | Attending: Physician Assistant

## 2018-10-01 DIAGNOSIS — L97412 Non-pressure chronic ulcer of right heel and midfoot with fat layer exposed: Secondary | ICD-10-CM | POA: Insufficient documentation

## 2018-10-01 DIAGNOSIS — Z86718 Personal history of other venous thrombosis and embolism: Secondary | ICD-10-CM | POA: Insufficient documentation

## 2018-10-01 DIAGNOSIS — E1040 Type 1 diabetes mellitus with diabetic neuropathy, unspecified: Secondary | ICD-10-CM | POA: Insufficient documentation

## 2018-10-01 DIAGNOSIS — E1069 Type 1 diabetes mellitus with other specified complication: Secondary | ICD-10-CM | POA: Insufficient documentation

## 2018-10-01 DIAGNOSIS — L97514 Non-pressure chronic ulcer of other part of right foot with necrosis of bone: Secondary | ICD-10-CM | POA: Diagnosis not present

## 2018-10-01 DIAGNOSIS — Z992 Dependence on renal dialysis: Secondary | ICD-10-CM | POA: Insufficient documentation

## 2018-10-01 DIAGNOSIS — M86671 Other chronic osteomyelitis, right ankle and foot: Secondary | ICD-10-CM | POA: Insufficient documentation

## 2018-10-01 DIAGNOSIS — G4730 Sleep apnea, unspecified: Secondary | ICD-10-CM | POA: Diagnosis not present

## 2018-10-01 DIAGNOSIS — I10 Essential (primary) hypertension: Secondary | ICD-10-CM | POA: Diagnosis not present

## 2018-10-01 LAB — GLUCOSE, CAPILLARY
Glucose-Capillary: 404 mg/dL — ABNORMAL HIGH (ref 70–99)
Glucose-Capillary: 336 mg/dL — ABNORMAL HIGH (ref 70–99)

## 2018-10-01 IMAGING — MR MR FOOT*L* W/O CM
4 of 5 series · 19 of 40 positions shown · non-contrast
Comparison: Radiographs 05/04/2017

CLINICAL DATA: Large hemorrhagic blister on the plantar aspect of
the foot. Abnormal x-rays

EXAM:
MRI OF THE LEFT FOOT WITHOUT CONTRAST
TECHNIQUE: Multiplanar, multisequence MR imaging of the left foot was
performed. No intravenous contrast was administered.

[Series 3: STIR · sagittal · 4.0mm · 0.35mm/px · 3 of 19 slices shown]
[im 1/19]
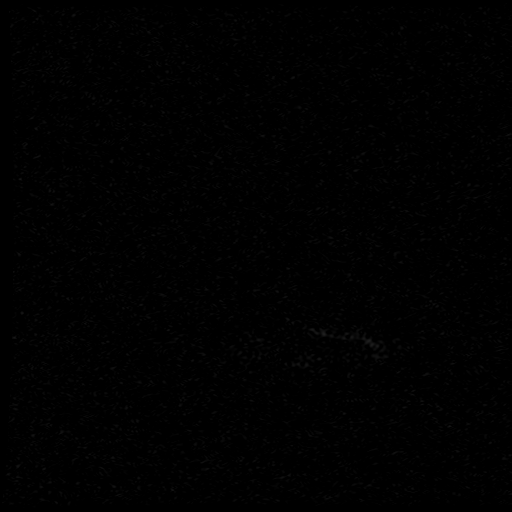
[im 10/19]
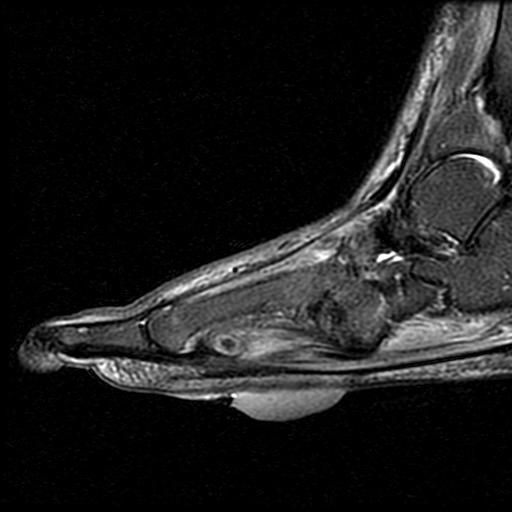
[im 19/19]
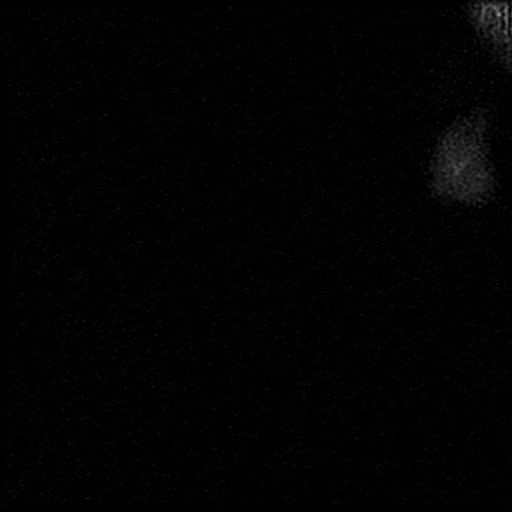

[Series 4: T1 · coronal · 4.0mm · 0.29mm/px · 9 of 32 slices shown (1 of 2)]
[im 1/32]
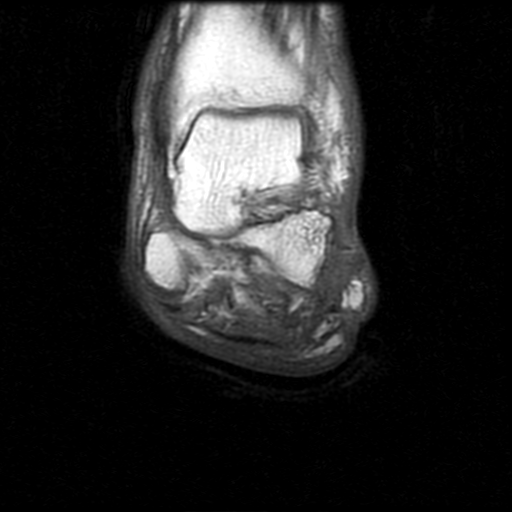
[im 4/32]
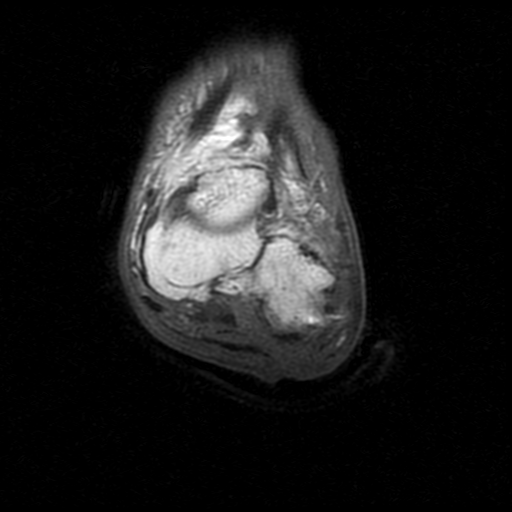
[im 8/32]
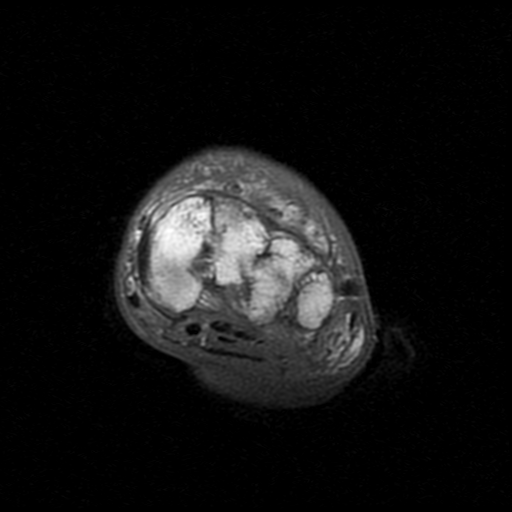
[im 12/32]
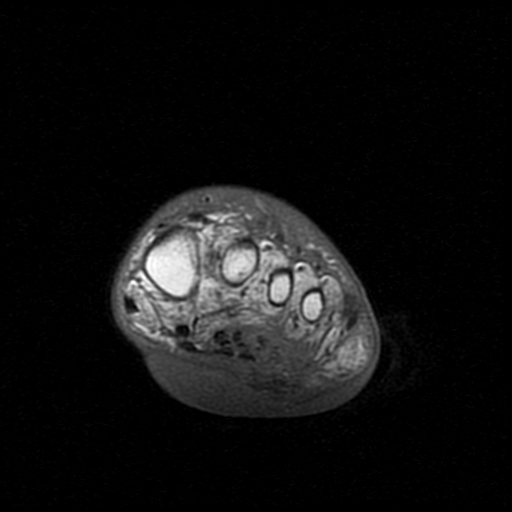
[im 16/32]
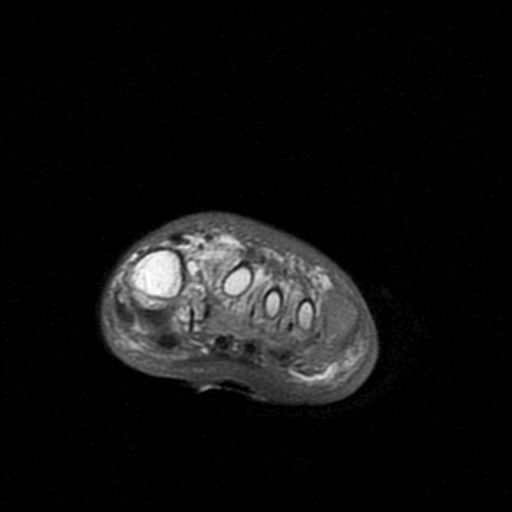
[im 20/32]
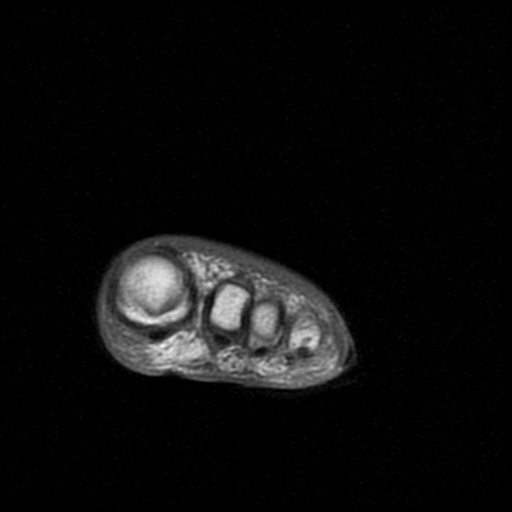
[im 24/32]
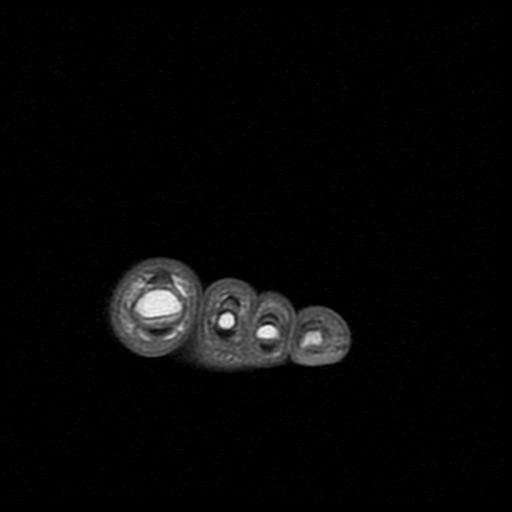
[im 28/32]
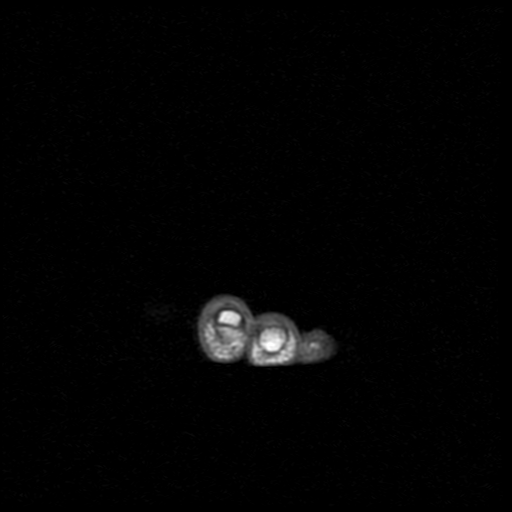
[im 32/32]
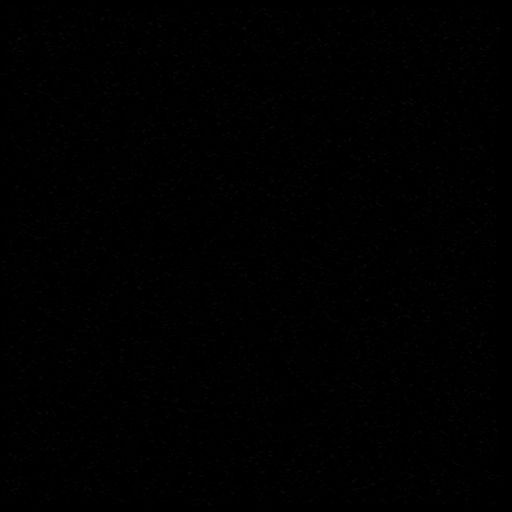

[Series 5: T2 · coronal · 4.0mm · 0.29mm/px · 3 of 32 slices shown]
[im 4/32]
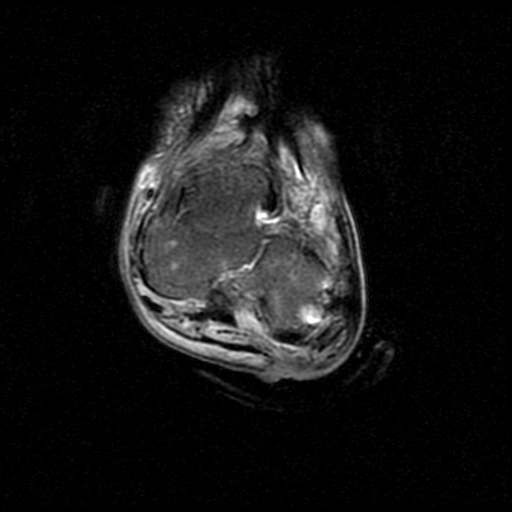
[im 18/32]
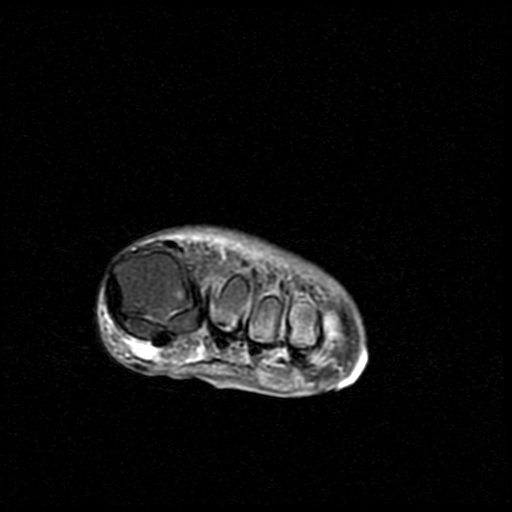
[im 28/32]
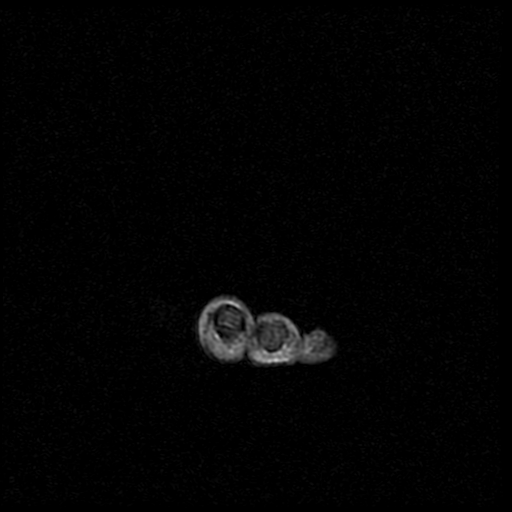

[Series 6: T1 · axial · 4.0mm · 0.35mm/px · z∈[-49,+48]mm · 4 of 25 slices shown (2 of 2)]
[im 1/25]
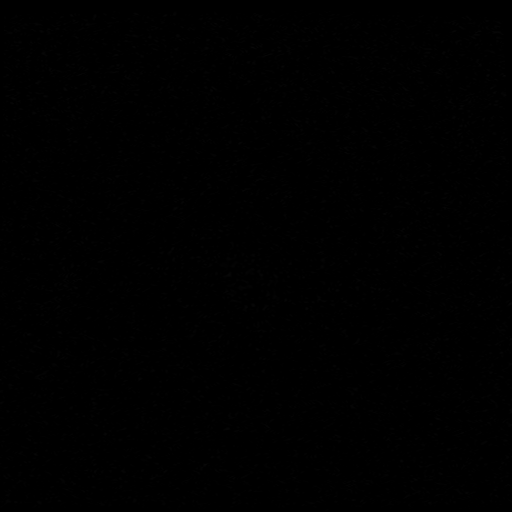
[im 4/25]
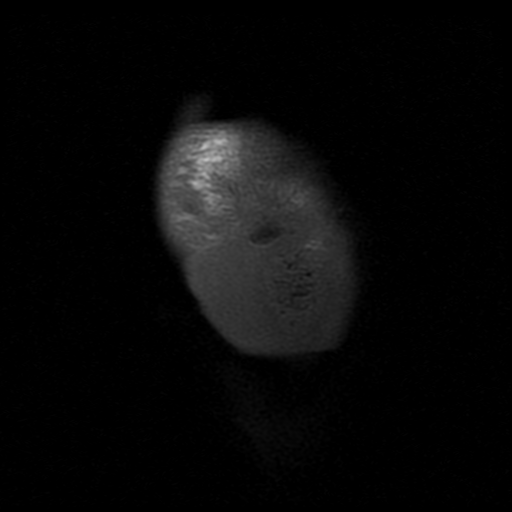
[im 14/25]
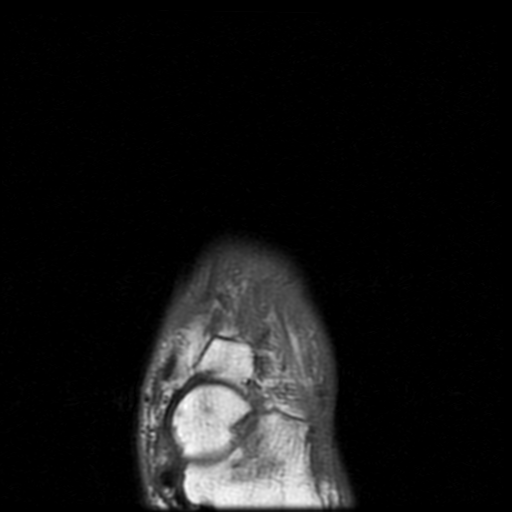
[im 21/25]
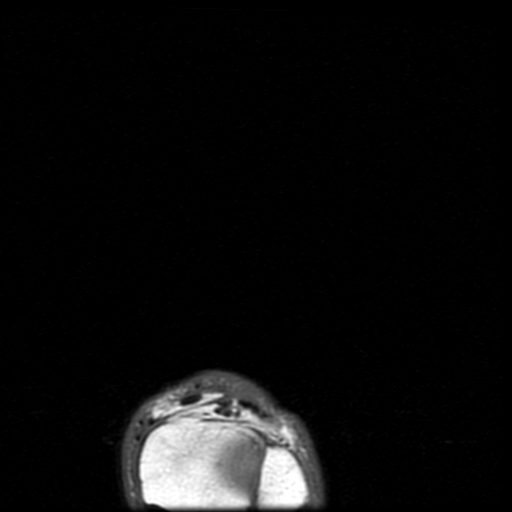

[19 of 40 positions shown; findings below may reference images not displayed]

FINDINGS: Abnormal T1 and T2 signal intensity in the mid and proximal
phalanges of the fourth digit along with mild edema like signal
abnormality in the third and fourth metatarsals. Findings worrisome
for osteomyelitis. The fifth ray has been resected. There is
fluid/phlegmon and gas in the soft tissues adjacent to the fourth
phalanx. There is also the fluid collection adjacent to the fourth
metatarsal head and neck which measures 18.4 x 15.0 x 11.5 mm and is
most consistent with an abscess.

On the plantar aspect of the foot is a large skin blister. Along its
distal aspect there appears to be an open wound. There is
significant gas in the plantar soft tissues. There is also signal
abnormality surrounding the flexor tendon of the fourth toe with a
probable small abscess and gas.

Diffuse cellulitis and myofasciitis.
IMPRESSION: 1. Abscess adjacent to the fourth metatarsal head and neck. Suspect
small abscess also surrounding the fourth flexor tendon.
2. Inflammatory phlegmon and gas in the soft tissues along the
lateral aspect of the fourth phalanx without discrete drainable
abscess.
3. Findings worrisome for osteomyelitis involving the fourth
proximal and middle phalanges and also the third and fourth
metatarsals.
4. Large skin blister on the plantar aspect of the foot with
adjacent gas in the soft tissues.
5. Cellulitis and myofasciitis.

## 2018-10-02 DIAGNOSIS — E1069 Type 1 diabetes mellitus with other specified complication: Secondary | ICD-10-CM | POA: Diagnosis not present

## 2018-10-02 LAB — GLUCOSE, CAPILLARY
Glucose-Capillary: 229 mg/dL — ABNORMAL HIGH (ref 70–99)
Glucose-Capillary: 213 mg/dL — ABNORMAL HIGH (ref 70–99)

## 2018-10-07 DIAGNOSIS — E1069 Type 1 diabetes mellitus with other specified complication: Secondary | ICD-10-CM | POA: Diagnosis not present

## 2018-10-07 LAB — GLUCOSE, CAPILLARY
Glucose-Capillary: 320 mg/dL — ABNORMAL HIGH (ref 70–99)
Glucose-Capillary: 342 mg/dL — ABNORMAL HIGH (ref 70–99)

## 2018-10-08 DIAGNOSIS — E1069 Type 1 diabetes mellitus with other specified complication: Secondary | ICD-10-CM | POA: Diagnosis not present

## 2018-10-08 LAB — GLUCOSE, CAPILLARY
Glucose-Capillary: 485 mg/dL — ABNORMAL HIGH (ref 70–99)
Glucose-Capillary: 353 mg/dL — ABNORMAL HIGH (ref 70–99)

## 2018-10-09 DIAGNOSIS — E1069 Type 1 diabetes mellitus with other specified complication: Secondary | ICD-10-CM | POA: Diagnosis not present

## 2018-10-09 LAB — GLUCOSE, CAPILLARY
Glucose-Capillary: 210 mg/dL — ABNORMAL HIGH (ref 70–99)
Glucose-Capillary: 257 mg/dL — ABNORMAL HIGH (ref 70–99)

## 2018-10-10 ENCOUNTER — Ambulatory Visit (INDEPENDENT_AMBULATORY_CARE_PROVIDER_SITE_OTHER): Payer: Medicare HMO | Admitting: Plastic Surgery

## 2018-10-10 ENCOUNTER — Encounter: Payer: Self-pay | Admitting: Plastic Surgery

## 2018-10-10 ENCOUNTER — Other Ambulatory Visit: Payer: Self-pay

## 2018-10-10 VITALS — BP 167/81 | HR 98 | Temp 99.2°F | Ht 67.0 in | Wt 125.0 lb

## 2018-10-10 DIAGNOSIS — E11628 Type 2 diabetes mellitus with other skin complications: Secondary | ICD-10-CM | POA: Diagnosis not present

## 2018-10-10 DIAGNOSIS — E1065 Type 1 diabetes mellitus with hyperglycemia: Secondary | ICD-10-CM

## 2018-10-10 DIAGNOSIS — E1051 Type 1 diabetes mellitus with diabetic peripheral angiopathy without gangrene: Secondary | ICD-10-CM

## 2018-10-10 DIAGNOSIS — IMO0002 Reserved for concepts with insufficient information to code with codable children: Secondary | ICD-10-CM

## 2018-10-10 DIAGNOSIS — M86471 Chronic osteomyelitis with draining sinus, right ankle and foot: Secondary | ICD-10-CM

## 2018-10-10 DIAGNOSIS — L089 Local infection of the skin and subcutaneous tissue, unspecified: Secondary | ICD-10-CM

## 2018-10-10 DIAGNOSIS — E1049 Type 1 diabetes mellitus with other diabetic neurological complication: Secondary | ICD-10-CM | POA: Diagnosis not present

## 2018-10-10 DIAGNOSIS — E1069 Type 1 diabetes mellitus with other specified complication: Secondary | ICD-10-CM | POA: Diagnosis not present

## 2018-10-10 LAB — GLUCOSE, CAPILLARY
Glucose-Capillary: 342 mg/dL — ABNORMAL HIGH (ref 70–99)
Glucose-Capillary: 368 mg/dL — ABNORMAL HIGH (ref 70–99)

## 2018-10-10 NOTE — Progress Notes (Signed)
Patient ID: [name], female    DOB: 1970/11/19, 48 y.o.   MRN: [id number]   Chief Complaint  Patient presents with  . Advice Only    for (R) foot wound    The patient is a 48 year old female with multiple medical problems as seen below.  She is here for evaluation of her right foot.  She has already had what looks to be a transmetatarsal amputation.  She has some necrosis within the wound.  The wound is approximately 6 x 9 cm.  The proximal portion of the wound has some nice granulation tissue but the distal part has some necrotic tissue.  When she squeezes her foot there is some clear purulence noted.  She has a PICC line in place and has been receiving IV antibiotics.  This condition is very serious and may not be salvageable.  We will know more after the first debridement.   Review of Systems  Constitutional: Positive for activity change. Negative for appetite change, chills and diaphoresis.  Eyes: Negative.   Respiratory: Negative for chest tightness and shortness of breath.   Cardiovascular: Negative for chest pain.  Gastrointestinal: Negative for abdominal pain.  Endocrine: Negative.   Genitourinary: Negative.   Skin: Positive for color change and wound.  Psychiatric/Behavioral: Negative.     Past Medical History:  Diagnosis Date  . Anemia   . Arthritis   . Bronchitis   . C. difficile diarrhea 09/26/2014  . Cataracts, both eyes   . Cellulitis of right foot 06/02/2014  . CKD (chronic kidney disease) stage 3, GFR 30-59 ml/min (HCC)    sees Dr Justin Mend- Stage IV  . Diabetic ulcer of right foot ([place]) 06/02/2014  . DVT (deep venous thrombosis) ([place]) 10/2014   one in each one in leg- PICC line , one in right and left arm  . H/O seasonal allergies   . Heart murmur    told once when she was pregnant- no furter mention- was in notes 11/2015- had Echo 8 /4/17  . History of blood transfusion   . Hypercholesteremia   . Hypertension   . Left foot infection   . Neuropathy    feet  . Neuropathy in diabetes ([place])   . Peripheral vascular disease ([place])   . Sleep apnea    not able to use CPAP  . Staphylococcus aureus bacteremia 10/2014  . Type 1 diabetes ([place])    onset age 67    Past Surgical History:  Procedure Laterality Date  . AMPUTATION TOE Left 07/24/2015   5th toe and Hallux amputation  . AV FISTULA PLACEMENT Left 08/06/2018   Procedure: INSERTION OF BASCILIC VEN TRANSPOSITION 1ST STAGE;  Surgeon: Rosetta Posner, MD;  Location: [place];  Service: Vascular;  Laterality: Left;  . CESAREAN SECTION    . EXOSTECTECTOMY TOE Left 04/11/2016   Procedure: EXOSTECTECTOMY CUBOID AND 5TH METATARSAL AND PLACEMENT OF ANTIBIOTIC BEADS;  Surgeon: Edrick Kins, DPM;  Location: [place];  Service: Podiatry;  Laterality: Left;  . EYE SURGERY Bilateral    [place]  . FEMORAL-POPLITEAL BYPASS GRAFT Left 05/03/2015  . FOOT AMPUTATION THROUGH METATARSAL Right 2016  . hemrrhoidectomy    . I&D EXTREMITY Right 06/10/2014   Procedure: IRRIGATION AND DEBRIDEMENT Right Foot;  Surgeon: Newt Minion, MD;  Location: [place];  Service: Orthopedics;  Laterality: Right;  . I&D EXTREMITY Right 07/31/2018   Procedure: IRRIGATION AND DEBRIDEMENT EXTREMITY;  Surgeon: Edrick Kins, DPM;  Location: [place];  Service: Podiatry;  Laterality: Right;  . I&D EXTREMITY Right 08/04/2018   Procedure: IRRIGATION AND DEBRIDEMENT FOOT with placement of wound vac;  Surgeon: Edrick Kins, DPM;  Location: [place];  Service: Podiatry;  Laterality: Right;  . INCISION AND DRAINAGE Left 04/11/2016   Procedure: INCISION AND DRAINAGE A DEEP COMPLICATED WOUND OF LEFT FOOT;  Surgeon: Edrick Kins, DPM;  Location: [place];  Service: Podiatry;  Laterality: Left;  . INSERTION OF DIALYSIS CATHETER Right 08/06/2018   Procedure: INSERTION OF TUNNELED DIALYSIS CATHETER;  Surgeon: Rosetta Posner, MD;  Location: [place];  Service: Vascular;  Laterality: Right;  . IR FLUORO GUIDE CV LINE RIGHT  09/24/2017  . IR FLUORO GUIDE CV LINE RIGHT  08/01/2018   . IR REMOVAL TUN CV CATH W/O FL  11/22/2017  . IR US GUIDE VASC ACCESS RIGHT  09/24/2017  . IR US GUIDE VASC ACCESS RIGHT  08/01/2018  . IRRIGATION AND DEBRIDEMENT ABSCESS Left 05/06/2017   Procedure: IRRIGATION AND DEBRIDEMENT ABSCESS LEFT FOOT;  Surgeon: Edrick Kins, DPM;  Location: [facility];  Service: Podiatry;  Laterality: Left;  . PERIPHERALLY INSERTED CENTRAL CATHETER INSERTION    . SKIN GRAFT Right 06/15/2014  . SKIN GRAFT Left 06/2015   foot   . SKIN SPLIT GRAFT Right 06/15/2014   Procedure: SPLIT THICKNESS SKIN GRAFT RIGHT FOOT;  Surgeon: Newt Minion, MD;  Location: [place];  Service: Orthopedics;  Laterality: Right;  . TRANSMETATARSAL AMPUTATION Right   . TRANSMETATARSAL AMPUTATION Right 09/11/2014      Current Outpatient Medications:  .  albuterol (PROVENTIL HFA;VENTOLIN HFA) 108 (90 BASE) MCG/ACT inhaler, Inhale 2 puffs into the lungs every 6 (six) hours as needed for wheezing or shortness of breath. Reported on 04/15/2015, Disp: , Rfl:  .  amLODipine (NORVASC) 5 MG tablet, Take 1 tablet (5 mg total) by mouth daily. (Patient taking differently: Take 5 mg by mouth every evening. ), Disp: 30 tablet, Rfl: 0 .  aspirin 81 MG tablet, Take 81 mg by mouth every evening. , Disp: , Rfl:  .  calcitRIOL (ROCALTROL) 0.5 MCG capsule, Take 1 capsule daily (Patient taking differently: Take 0.5 mcg by mouth daily. ), Disp: 90 capsule, Rfl: 2 .  calcium acetate (PHOSLO) 667 MG capsule, Take 2 capsules (1,334 mg total) by mouth 3 (three) times daily with meals., Disp: 180 capsule, Rfl: 0 .  CINNAMON PO, Take 1 tablet by mouth every evening. , Disp: , Rfl:  .  folic acid (FOLVITE) 1 MG tablet, Take 1 mg by mouth every evening. , Disp: , Rfl:  .  HUMALOG 100 UNIT/ML injection, CALL MD IF <70, IF 151-200 =2UNITS,201-250 =4 UNITS,251-300 =6 UNITS, 301-350 =8 UNITS,351-400 =10 U (Patient taking differently: INSULIN PUMP), Disp: 30 mL, Rfl: 0 .  pravastatin (PRAVACHOL) 40 MG tablet, TAKE 1 TABLET BY MOUTH  EVERY DAY (Patient taking differently: Take 40 mg by mouth daily. ), Disp: 90 tablet, Rfl: 1 .  pyridoxine (B-6) 100 MG tablet, Take 100 mg by mouth daily. Reported on 04/15/2015, Disp: , Rfl:  .  TURMERIC PO, Take 1 capsule by mouth daily., Disp: , Rfl:  .  Vitamin D, Ergocalciferol, (DRISDOL) 50000 units CAPS capsule, TAKE ONE CAPSULE BY MOUTH ONE TIME PER WEEK, Disp: 12 capsule, Rfl: 0 .  Continuous Blood Gluc Receiver (FREESTYLE LIBRE READER) DEVI, 1 Device by Does not apply route as directed. (Patient not taking: Reported on 10/10/2018), Disp: 1 Device, Rfl: 1 .  Continuous Blood Gluc  Sensor (FREESTYLE LIBRE SENSOR SYSTEM) MISC, Use 1 sensor every 10 days (Patient not taking: Reported on 10/10/2018), Disp: 3 each, Rfl: 4 .  vancomycin (VANCOCIN) 500-5 MG/100ML-% IVPB, Inject 100 mLs (500 mg total) into the vein every Monday, Wednesday, and Friday with hemodialysis. (Patient not taking: Reported on 10/10/2018), Disp: 100 mL, Rfl:    Objective:   Vitals:   10/10/18 0931  BP: (!) 167/81  Pulse: 98  Temp: 99.2 F (37.3 C)  SpO2: 100%    Physical Exam Vitals signs and nursing note reviewed.  Constitutional:      Appearance: Normal appearance.  HENT:     Head: Normocephalic and atraumatic.  Cardiovascular:     Rate and Rhythm: Normal rate.  Pulmonary:     Effort: Pulmonary effort is normal. No respiratory distress.  Abdominal:     General: Abdomen is flat. There is no distension.  Musculoskeletal:       Feet:  Skin:    Findings: Bruising present.  Neurological:     General: No focal deficit present.     Mental Status: She is alert and oriented to person, place, and time.  Psychiatric:        Mood and Affect: Mood normal.        Behavior: Behavior normal.     Assessment & Plan:     ICD-10-CM   1. 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 Type 1 diabetes mellitus with neurological manifestations, uncontrolled (HCC)  E10.49    E10.65   3.  Diabetes mellitus type 1 with peripheral artery disease (HCC)  E10.51   4. Diabetic foot infection ([place])  E11.628    L08.9     Recommend excision of right foot wound with ACell and VAC placement.  She already has the VAC at home.  Continue with IV antibiotics.  Notes were reviewed including notes from Drs. Irene Limbo, Early and [employer]. The patient has a very serious condition with her foot.  I do not know if it is salvageable and I told her this.  I am willing to try.  I think we will know pretty quickly if it is salvageable.  Patient is willing to do all that she can to avoid a fall below-knee amputation. Pictures were obtained of the patient and placed in the chart with the patient's or guardian's permission.   [place], DO

## 2018-10-10 NOTE — H&P (View-Only) (Signed)
Patient ID: [name], female    DOB: Aug 23, 1970, 48 y.o.   MRN: [id number]   Chief Complaint  Patient presents with  . Advice Only    for (R) foot wound    The patient is a 48 year old female with multiple medical problems as seen below.  She is here for evaluation of her right foot.  She has already had what looks to be a transmetatarsal amputation.  She has some necrosis within the wound.  The wound is approximately 6 x 9 cm.  The proximal portion of the wound has some nice granulation tissue but the distal part has some necrotic tissue.  When she squeezes her foot there is some clear purulence noted.  She has a PICC line in place and has been receiving IV antibiotics.  This condition is very serious and may not be salvageable.  We will know more after the first debridement.   Review of Systems  Constitutional: Positive for activity change. Negative for appetite change, chills and diaphoresis.  Eyes: Negative.   Respiratory: Negative for chest tightness and shortness of breath.   Cardiovascular: Negative for chest pain.  Gastrointestinal: Negative for abdominal pain.  Endocrine: Negative.   Genitourinary: Negative.   Skin: Positive for color change and wound.  Psychiatric/Behavioral: Negative.     Past Medical History:  Diagnosis Date  . Anemia   . Arthritis   . Bronchitis   . C. difficile diarrhea 09/26/2014  . Cataracts, both eyes   . Cellulitis of right foot 06/02/2014  . CKD (chronic kidney disease) stage 3, GFR 30-59 ml/min (HCC)    sees Dr Justin Mend- Stage IV  . Diabetic ulcer of right foot ([place]) 06/02/2014  . DVT (deep venous thrombosis) ([place]) 10/2014   one in each one in leg- PICC line , one in right and left arm  . H/O seasonal allergies   . Heart murmur    told once when she was pregnant- no furter mention- was in notes 11/2015- had Echo 8 /4/17  . History of blood transfusion   . Hypercholesteremia   . Hypertension   . Left foot infection   . Neuropathy    feet  . Neuropathy in diabetes ([name])   . Peripheral vascular disease ([place])   . Sleep apnea    not able to use CPAP  . Staphylococcus aureus bacteremia 10/2014  . Type 1 diabetes ([place])    onset age 49    Past Surgical History:  Procedure Laterality Date  . AMPUTATION TOE Left 07/24/2015   5th toe and Hallux amputation  . AV FISTULA PLACEMENT Left 08/06/2018   Procedure: INSERTION OF BASCILIC VEN TRANSPOSITION 1ST STAGE;  Surgeon: Rosetta Posner, MD;  Location: [place];  Service: Vascular;  Laterality: Left;  . CESAREAN SECTION    . EXOSTECTECTOMY TOE Left 04/11/2016   Procedure: EXOSTECTECTOMY CUBOID AND 5TH METATARSAL AND PLACEMENT OF ANTIBIOTIC BEADS;  Surgeon: Edrick Kins, DPM;  Location: [place];  Service: Podiatry;  Laterality: Left;  . EYE SURGERY Bilateral    Lazer  . FEMORAL-POPLITEAL BYPASS GRAFT Left 05/03/2015  . FOOT AMPUTATION THROUGH METATARSAL Right 2016  . hemrrhoidectomy    . I&D EXTREMITY Right 06/10/2014   Procedure: IRRIGATION AND DEBRIDEMENT Right Foot;  Surgeon: Newt Minion, MD;  Location: [facility];  Service: Orthopedics;  Laterality: Right;  . I&D EXTREMITY Right 07/31/2018   Procedure: IRRIGATION AND DEBRIDEMENT EXTREMITY;  Surgeon: Edrick Kins, DPM;  Location: [place];  Service: Podiatry;  Laterality: Right;  . I&D EXTREMITY Right 08/04/2018   Procedure: IRRIGATION AND DEBRIDEMENT FOOT with placement of wound vac;  Surgeon: Edrick Kins, DPM;  Location: [place];  Service: Podiatry;  Laterality: Right;  . INCISION AND DRAINAGE Left 04/11/2016   Procedure: INCISION AND DRAINAGE A DEEP COMPLICATED WOUND OF LEFT FOOT;  Surgeon: Edrick Kins, DPM;  Location: [place];  Service: Podiatry;  Laterality: Left;  . INSERTION OF DIALYSIS CATHETER Right 08/06/2018   Procedure: INSERTION OF TUNNELED DIALYSIS CATHETER;  Surgeon: Rosetta Posner, MD;  Location: [place];  Service: Vascular;  Laterality: Right;  . IR FLUORO GUIDE CV LINE RIGHT  09/24/2017  . IR FLUORO GUIDE CV LINE RIGHT  08/01/2018   . IR REMOVAL TUN CV CATH W/O FL  11/22/2017  . IR US GUIDE VASC ACCESS RIGHT  09/24/2017  . IR US GUIDE VASC ACCESS RIGHT  08/01/2018  . IRRIGATION AND DEBRIDEMENT ABSCESS Left 05/06/2017   Procedure: IRRIGATION AND DEBRIDEMENT ABSCESS LEFT FOOT;  Surgeon: Edrick Kins, DPM;  [facility];  Service: Podiatry;  Laterality: Left;  . PERIPHERALLY INSERTED CENTRAL CATHETER INSERTION    . SKIN GRAFT Right 06/15/2014  . SKIN GRAFT Left 06/2015   foot   . SKIN SPLIT GRAFT Right 06/15/2014   Procedure: SPLIT THICKNESS SKIN GRAFT RIGHT FOOT;  Surgeon: Newt Minion, MD;  Location: [facility];  Service: Orthopedics;  Laterality: Right;  . TRANSMETATARSAL AMPUTATION Right   . TRANSMETATARSAL AMPUTATION Right 09/11/2014      Current Outpatient Medications:  .  albuterol (PROVENTIL HFA;VENTOLIN HFA) 108 (90 BASE) MCG/ACT inhaler, Inhale 2 puffs into the lungs every 6 (six) hours as needed for wheezing or shortness of breath. Reported on 04/15/2015, Disp: , Rfl:  .  amLODipine (NORVASC) 5 MG tablet, Take 1 tablet (5 mg total) by mouth daily. (Patient taking differently: Take 5 mg by mouth every evening. ), Disp: 30 tablet, Rfl: 0 .  aspirin 81 MG tablet, Take 81 mg by mouth every evening. , Disp: , Rfl:  .  calcitRIOL (ROCALTROL) 0.5 MCG capsule, Take 1 capsule daily (Patient taking differently: Take 0.5 mcg by mouth daily. ), Disp: 90 capsule, Rfl: 2 .  calcium acetate (PHOSLO) 667 MG capsule, Take 2 capsules (1,334 mg total) by mouth 3 (three) times daily with meals., Disp: 180 capsule, Rfl: 0 .  CINNAMON PO, Take 1 tablet by mouth every evening. , Disp: , Rfl:  .  folic acid (FOLVITE) 1 MG tablet, Take 1 mg by mouth every evening. , Disp: , Rfl:  .  HUMALOG 100 UNIT/ML injection, CALL MD IF <70, IF 151-200 =2UNITS,201-250 =4 UNITS,251-300 =6 UNITS, 301-350 =8 UNITS,351-400 =10 U (Patient taking differently: INSULIN PUMP), Disp: 30 mL, Rfl: 0 .  pravastatin (PRAVACHOL) 40 MG tablet, TAKE 1 TABLET BY MOUTH  EVERY DAY (Patient taking differently: Take 40 mg by mouth daily. ), Disp: 90 tablet, Rfl: 1 .  pyridoxine (B-6) 100 MG tablet, Take 100 mg by mouth daily. Reported on 04/15/2015, Disp: , Rfl:  .  TURMERIC PO, Take 1 capsule by mouth daily., Disp: , Rfl:  .  Vitamin D, Ergocalciferol, (DRISDOL) 50000 units CAPS capsule, TAKE ONE CAPSULE BY MOUTH ONE TIME PER WEEK, Disp: 12 capsule, Rfl: 0 .  Continuous Blood Gluc Receiver (FREESTYLE LIBRE READER) DEVI, 1 Device by Does not apply route as directed. (Patient not taking: Reported on 10/10/2018), Disp: 1 Device, Rfl: 1 .  Continuous Blood Gluc  Sensor (FREESTYLE LIBRE SENSOR SYSTEM) MISC, Use 1 sensor every 10 days (Patient not taking: Reported on 10/10/2018), Disp: 3 each, Rfl: 4 .  vancomycin (VANCOCIN) 500-5 MG/100ML-% IVPB, Inject 100 mLs (500 mg total) into the vein every Monday, Wednesday, and Friday with hemodialysis. (Patient not taking: Reported on 10/10/2018), Disp: 100 mL, Rfl:    Objective:   Vitals:   10/10/18 0931  BP: (!) 167/81  Pulse: 98  Temp: 99.2 F (37.3 C)  SpO2: 100%    Physical Exam Vitals signs and nursing note reviewed.  Constitutional:      Appearance: Normal appearance.  HENT:     Head: Normocephalic and atraumatic.  Cardiovascular:     Rate and Rhythm: Normal rate.  Pulmonary:     Effort: Pulmonary effort is normal. No respiratory distress.  Abdominal:     General: Abdomen is flat. There is no distension.  Musculoskeletal:       Feet:  Skin:    Findings: Bruising present.  Neurological:     General: No focal deficit present.     Mental Status: She is alert and oriented to person, place, and time.  Psychiatric:        Mood and Affect: Mood normal.        Behavior: Behavior normal.     Assessment & Plan:     ICD-10-CM   1. 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 Type 1 diabetes mellitus with neurological manifestations, uncontrolled (HCC)  E10.49    E10.65   3.  Diabetes mellitus type 1 with peripheral artery disease (HCC)  E10.51   4. Diabetic foot infection ([place])  E11.628    L08.9     Recommend excision of right foot wound with ACell and VAC placement.  She already has the VAC at home.  Continue with IV antibiotics.  Notes were reviewed including notes from Drs. Irene Limbo, Early and [employer]. The patient has a very serious condition with her foot.  I do not know if it is salvageable and I told her this.  I am willing to try.  I think we will know pretty quickly if it is salvageable.  Patient is willing to do all that she can to avoid a fall below-knee amputation. Pictures were obtained of the patient and placed in the chart with the patient's or guardian's permission.   [place], DO

## 2018-10-13 ENCOUNTER — Other Ambulatory Visit: Payer: Self-pay

## 2018-10-13 ENCOUNTER — Ambulatory Visit: Payer: Medicare Other | Admitting: Endocrinology

## 2018-10-13 DIAGNOSIS — E1069 Type 1 diabetes mellitus with other specified complication: Secondary | ICD-10-CM | POA: Diagnosis not present

## 2018-10-13 LAB — GLUCOSE, CAPILLARY
Glucose-Capillary: 455 mg/dL — ABNORMAL HIGH (ref 70–99)
Glucose-Capillary: 427 mg/dL — ABNORMAL HIGH (ref 70–99)

## 2018-10-14 ENCOUNTER — Other Ambulatory Visit: Payer: Self-pay | Admitting: Endocrinology

## 2018-10-14 DIAGNOSIS — E1069 Type 1 diabetes mellitus with other specified complication: Secondary | ICD-10-CM | POA: Diagnosis not present

## 2018-10-14 LAB — GLUCOSE, CAPILLARY
Glucose-Capillary: 269 mg/dL — ABNORMAL HIGH (ref 70–99)
Glucose-Capillary: 376 mg/dL — ABNORMAL HIGH (ref 70–99)

## 2018-10-15 ENCOUNTER — Telehealth: Payer: Self-pay | Admitting: Nutrition

## 2018-10-15 ENCOUNTER — Other Ambulatory Visit: Payer: Self-pay

## 2018-10-15 ENCOUNTER — Ambulatory Visit (INDEPENDENT_AMBULATORY_CARE_PROVIDER_SITE_OTHER): Payer: Medicare HMO | Admitting: Endocrinology

## 2018-10-15 ENCOUNTER — Encounter: Payer: Self-pay | Admitting: Endocrinology

## 2018-10-15 VITALS — BP 140/70 | HR 97 | Ht 67.0 in | Wt 122.2 lb

## 2018-10-15 DIAGNOSIS — E1065 Type 1 diabetes mellitus with hyperglycemia: Secondary | ICD-10-CM | POA: Diagnosis not present

## 2018-10-15 LAB — POCT GLYCOSYLATED HEMOGLOBIN (HGB A1C): Hemoglobin A1C: 11.4 % — AB (ref 4.0–5.6)

## 2018-10-15 NOTE — Telephone Encounter (Signed)
I changed her basal rates per Dr. Ronnie Derby written order.  I gave her Chris's number for Tandem.  They have been waiting to ship the pump, but she has not returned his call.  We discussed how to give an audio bolus with the Tandem pump, because she says that she can not see clearly after Dialysis.  She agreed to call Gerald Stabs today

## 2018-10-15 NOTE — Progress Notes (Signed)
Patient ID: [name], female   DOB: [birth year], 48 y.o.   MRN: [id number]   Reason for Appointment: follow-up of diabetes  History of Present Illness   Diagnosis: Type 1 DIABETES MELITUS  Previous history: She has had long-standing poorly controlled diabetes and typically has poor compliance with self care measures despite periodic diabetes education and periodic followup  INSULIN PUMP regimen:   Currently using MEDTRONIC 723 model   Basal rates: Midnight to 11 AM = 0.4, at 1130 AM = 0.8, 3 PM = 0.6 until 10 PM, 10 PM = 0.75   Carb coverage 1: 20 g and correction 1: 70 during the day and 80 from 10 PM-8 AM Glucose target 150, active insulin 4 hours         Recent history:   Her last A1c was 10.4 and is now 11.4  Current blood sugar patterns, difficulties with management and problems identified:   She is not using the freestyle libre partly because of cost and also she has not requested a refill  She says she is using her generic meter but her test strips are expired  She cannot afford the Contour test strips  HYPERGLYCEMIA: She has persistently high blood sugars throughout the day and in the last 2 weeks only 2 readings in the 200+ range midday  She again forgets to bolus when she is eating and most likely is bolusing only occasionally particularly when her blood sugar has gone up  Even with her current boluses her blood sugar stays persistently high  As before she is not always watching her carbohydrates and sweet drinks; she thinks she is mostly eating a sandwich once a day for a meal but yesterday she had food intake 4 times based on her carbohydrate entry  She does suspend her pump when she is going for hyperbaric treatment and this can be for up to 2 hours  Blood sugars entered in her pump are reading over 400 through the night and also after about 8 PM  Blood sugars midday and early afternoon range from 229 up to 400+  Overall average blood  sugar 466   No hypoglycemia recently  Meals: eating at variable times and also having snacks periodically.  Her daily carbohydrate intake is quite variable Physical activity: exercise: none         Dietician visit: Most recent:?          Complications: are: Neuropathy, nephropathy, diabetic foot ulcer, amputation    Wt Readings from Last 3 Encounters:  10/15/18 122 lb 3.2 oz (55.4 kg)  10/10/18 125 lb (56.7 kg)  09/16/18 124 lb (56.2 kg)   Lab Results  Component Value Date   HGBA1C 11.4 (A) 10/15/2018   HGBA1C 10.4 (H) 07/30/2018   HGBA1C 10.3 (H) 06/12/2018   Lab Results  Component Value Date   MICROALBUR 304.2 (H) 11/11/2017   LDLCALC 78 05/02/2016   CREATININE 3.81 (H) 08/08/2018   Office Visit on 10/15/2018  Component Date Value Ref Range Status  . Hemoglobin A1C 10/15/2018 11.4* 4.0 - 5.6 % Final    Other problems addressed:: See review of systems   Allergies as of 10/15/2018      Reactions   Cleocin [clindamycin Hcl] Diarrhea   Lisinopril Other (See Comments)   Elevated potassium per pt report   Amoxicillin Diarrhea, Nausea Only, Other (See Comments)   Has patient had a PCN reaction causing immediate rash, facial/tongue/throat swelling, SOB or lightheadedness with hypotension: No Has patient  had a PCN reaction causing severe rash involving mucus membranes or skin necrosis: No Has patient had a PCN reaction that required hospitalization: No Has patient had a PCN reaction occurring within the last 10 years: No If all of the above answers are "NO", then may proceed with Cephalosporin use.   Bactrim [sulfamethoxazole-trimethoprim] Diarrhea, Nausea Only      Medication List       Accurate as of October 15, 2018 10:40 AM. If you have any questions, ask your nurse or doctor.        STOP taking these medications   vancomycin 500-5 MG/100ML-% IVPB Commonly known as: VANCOCIN Stopped by: Elayne Snare, MD     TAKE these medications   albuterol 108 (90 Base)  MCG/ACT inhaler Commonly known as: VENTOLIN HFA Inhale 2 puffs into the lungs every 6 (six) hours as needed for wheezing or shortness of breath. Reported on 04/15/2015   amLODipine 5 MG tablet Commonly known as: NORVASC Take 1 tablet (5 mg total) by mouth daily. What changed: when to take this   aspirin 81 MG tablet Take 81 mg by mouth every evening.   calcitRIOL 0.5 MCG capsule Commonly known as: ROCALTROL Take 1 capsule daily What changed:   how much to take  how to take this  when to take this  additional instructions   calcium acetate 667 MG capsule Commonly known as: PHOSLO Take 2 capsules (1,334 mg total) by mouth 3 (three) times daily with meals.   CINNAMON PO Take 1 tablet by mouth every evening.   Contour Next Test test strip Generic drug: glucose blood USE AS INSTRUCTED TO CHECK BLOOD SUGAR 4 TIMES A DAY. DX W96.04   folic acid 1 MG tablet Commonly known as: FOLVITE Take 1 mg by mouth every evening.   FreeStyle Libre Reader Devi 1 Device by Does not apply route as directed.   FreeStyle Optician, dispensing Use 1 sensor every 10 days   HumaLOG 100 UNIT/ML injection Generic drug: insulin lispro CALL MD IF <70, IF 151-200 =2UNITS,201-250 =4 UNITS,251-300 =6 UNITS, 301-350 =8 UNITS,351-400 =10 U What changed: See the new instructions.   pravastatin 40 MG tablet Commonly known as: PRAVACHOL TAKE 1 TABLET BY MOUTH EVERY DAY   pyridoxine 100 MG tablet Commonly known as: B-6 Take 100 mg by mouth daily. Reported on 04/15/2015   TURMERIC PO Take 1 capsule by mouth daily.   Vitamin D (Ergocalciferol) 1.25 MG (50000 UT) Caps capsule Commonly known as: DRISDOL TAKE ONE CAPSULE BY MOUTH ONE TIME PER WEEK       Allergies:  Allergies  Allergen Reactions  . Cleocin [Clindamycin Hcl] Diarrhea  . Lisinopril Other (See Comments)    Elevated potassium per pt report  . Amoxicillin Diarrhea, Nausea Only and Other (See Comments)    Has patient had a PCN  reaction causing immediate rash, facial/tongue/throat swelling, SOB or lightheadedness with hypotension: No Has patient had a PCN reaction causing severe rash involving mucus membranes or skin necrosis: No Has patient had a PCN reaction that required hospitalization: No Has patient had a PCN reaction occurring within the last 10 years: No If all of the above answers are "NO", then may proceed with Cephalosporin use.   . Bactrim [Sulfamethoxazole-Trimethoprim] Diarrhea and Nausea Only    Past Medical History:  Diagnosis Date  . Anemia   . Arthritis   . Bronchitis   . C. difficile diarrhea 09/26/2014  . Cataracts, both eyes   . Cellulitis of  right foot 06/02/2014  . CKD (chronic kidney disease) stage 3, GFR 30-59 ml/min (HCC)    sees Dr Justin Mend- Stage IV  . Diabetic ulcer of right foot ([place]) 06/02/2014  . DVT (deep venous thrombosis) ([place]) 10/2014   one in each one in leg- PICC line , one in right and left arm  . H/O seasonal allergies   . Heart murmur    told once when she was pregnant- no furter mention- was in notes 11/2015- had Echo 8 /4/17  . History of blood transfusion   . Hypercholesteremia   . Hypertension   . Left foot infection   . Neuropathy    feet  . Neuropathy in diabetes ([place])   . Peripheral vascular disease ([place])   . Sleep apnea    not able to use CPAP  . Staphylococcus aureus bacteremia 10/2014  . Type 1 diabetes ([place])    onset age 88    Past Surgical History:  Procedure Laterality Date  . AMPUTATION TOE Left 07/24/2015   5th toe and Hallux amputation  . AV FISTULA PLACEMENT Left 08/06/2018   Procedure: INSERTION OF BASCILIC VEN TRANSPOSITION 1ST STAGE;  Surgeon: Rosetta Posner, MD;  Location: [facility] [place];  Service: Vascular;  Laterality: Left;  . CESAREAN SECTION    . EXOSTECTECTOMY TOE Left 04/11/2016   Procedure: EXOSTECTECTOMY CUBOID AND 5TH METATARSAL AND PLACEMENT OF ANTIBIOTIC BEADS;  Surgeon: Edrick Kins, DPM;  Location: [facility];  Service: Podiatry;  Laterality:  Left;  . EYE SURGERY Bilateral    [facility]azer  . FEMORAL-POPLITEAL BYPASS GRAFT Left 05/03/2015  . FOOT AMPUTATION THROUGH METATARSAL Right 2016  . hemrrhoidectomy    . I&D EXTREMITY Right 06/10/2014   Procedure: IRRIGATION AND DEBRIDEMENT Right Foot;  Surgeon: Newt Minion, MD;  Location: [facility];  Service: Orthopedics;  Laterality: Right;  . I&D EXTREMITY Right 07/31/2018   Procedure: IRRIGATION AND DEBRIDEMENT EXTREMITY;  Surgeon: Edrick Kins, DPM;  Location: [facility];  Service: Podiatry;  Laterality: Right;  . I&D EXTREMITY Right 08/04/2018   Procedure: IRRIGATION AND DEBRIDEMENT FOOT with placement of wound vac;  Surgeon: Edrick Kins, DPM;  Location: [facility];  Service: Podiatry;  Laterality: Right;  . INCISION AND DRAINAGE Left 04/11/2016   Procedure: INCISION AND DRAINAGE A DEEP COMPLICATED WOUND OF LEFT FOOT;  Surgeon: Edrick Kins, DPM;  Location: [facility];  Service: Podiatry;  Laterality: Left;  . INSERTION OF DIALYSIS CATHETER Right 08/06/2018   Procedure: INSERTION OF TUNNELED DIALYSIS CATHETER;  Surgeon: Rosetta Posner, MD;  Location: [facility];  Service: Vascular;  Laterality: Right;  . IR FLUORO GUIDE CV LINE RIGHT  09/24/2017  . IR FLUORO GUIDE CV LINE RIGHT  08/01/2018  . IR REMOVAL TUN CV CATH W/O FL  11/22/2017  . IR US GUIDE VASC ACCESS RIGHT  09/24/2017  . IR US GUIDE VASC ACCESS RIGHT  08/01/2018  . IRRIGATION AND DEBRIDEMENT ABSCESS Left 05/06/2017   Procedure: IRRIGATION AND DEBRIDEMENT ABSCESS LEFT FOOT;  Surgeon: Edrick Kins, DPM;  [facility];  Service: Podiatry;  Laterality: Left;  . PERIPHERALLY INSERTED CENTRAL CATHETER INSERTION    . SKIN GRAFT Right 06/15/2014  . SKIN GRAFT Left 06/2015   foot   . SKIN SPLIT GRAFT Right 06/15/2014   Procedure: SPLIT THICKNESS SKIN GRAFT RIGHT FOOT;  Surgeon: Newt Minion, MD;  Location: [facility];  Service: Orthopedics;  Laterality: Right;  . TRANSMETATARSAL AMPUTATION Right   . TRANSMETATARSAL AMPUTATION Right 09/11/2014  Family History   Problem Relation Age of Onset  . Hyperlipidemia Mother   . Dementia Mother     Social History:  reports that she quit smoking about 10 years ago. She quit after 15.00 years of use. She has never used smokeless tobacco. She reports current alcohol use. She reports that she does not use drugs.  Review of Systems:   Hypertension/CKD:     Hypertension  Treated with amlodipine 5 mg, followed by nephrologist  BP Readings from Last 3 Encounters:  10/15/18 140/70  10/10/18 (!) 167/81  09/26/18 (!) 153/76     Lab Results  Component Value Date   K 4.6 08/08/2018   Renal function is worse and is now on dialysis   Lab Results  Component Value Date   CREATININE 3.81 (H) 08/08/2018   CREATININE 3.87 (H) 08/06/2018   CREATININE 3.10 (H) 08/05/2018    She is taking calcitriol 0.5 mcg from nephrologist  Also has history of vitamin D deficiency   Lab Results  Component Value Date   CALCIUM 6.7 (L) 08/08/2018   PHOS 4.6 08/08/2018     Lipids: Has been  treated with pravastatin 40 mg  Triglycerides are high on the last measurement LDL as follows   Lab Results  Component Value Date   CHOL 152 06/12/2018   HDL 24.40 (L) 06/12/2018   LDLCALC 78 05/02/2016   LDLDIRECT 82.0 06/12/2018   TRIG 222.0 (H) 06/12/2018   CHOLHDL 6 06/12/2018      Chronic anemia, followed by nephrologist and PCP:  Lab Results  Component Value Date   WBC 13.9 (H) 08/08/2018   HGB 8.1 (L) 08/08/2018   HCT 26.7 (L) 08/08/2018   MCV 90.2 08/08/2018   PLT 319 08/08/2018    She has a Charcot foot, has had amputation  Currently getting hyperbaric treatment for osteomyelitis of the right foot which apparently is not clearly healing     Examination:   BP 140/70 (BP Location: Left Arm, Patient Position: Sitting, Cuff Size: Normal)   Pulse 97   Ht 5\' 7"  (1.702 m)   Wt 122 lb 3.2 oz (55.4 kg)   SpO2 99%   BMI 19.14 kg/m   Body mass index is 19.14 kg/m.      ASSESSMENT/ PLAN:    Diabetes type 1 with poor control and multiple complications    Her diabetes is persistently poorly controlled related to inadequate insulin most of the time  See history of present illness for detailed discussion of current diabetes management, blood sugar patterns and problems identified  A1c is 11.4  Her blood sugars are averaging over 400 recently She is not bolusing when she is eating and only sporadically after meals or when the blood sugar is higher She does need a higher basal rate because her correction does not bring her blood sugar down and even with bolusing up to 4 times a day She is not allowed to use her pump while she is in the hyperbaric chamber which will also increase her blood sugar  Previously has been afraid of hypoglycemia and is now still waiting to get the T-Slim pump Discussed the advantages of this pump over the current pump which does not have a CGM also  Still not able to program her pump herself  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: followed by nephrologist    PLAN:     She was told about the importance of getting her blood sugar down because of continued  severe foot infection  Emphasized the need to bolus before each meal and also check blood sugars several times a day and make corrections  Discussed trying to get blood sugar down at least below 200 consistently  Basal rates will be increased throughout the day except midday as follows  Midnight = 0.5, 3 AM = 0.5, 6 AM = 0.55, 11 AM = 0.45 and 1 PM = 0.85, 3 PM = 0.75  She will call if she has any tendency to hypoglycemia  Again encouraged to get the freestyle libre sensor until she gets the new pump  She will again try to get the T-Slim pump and she does need to call the representative to get approval to ship this  Insulin settings were programmed on her pump today  There are no Patient Instructions on file for this visit.   Counseling time on subjects  discussed in assessment and plan sections is over 50% of today's 25 minute visit   Elayne Snare 10/15/2018, 10:40 AM      Elayne Snare 10/15/18

## 2018-10-16 DIAGNOSIS — E1069 Type 1 diabetes mellitus with other specified complication: Secondary | ICD-10-CM | POA: Diagnosis not present

## 2018-10-16 LAB — GLUCOSE, CAPILLARY: Glucose-Capillary: 352 mg/dL — ABNORMAL HIGH (ref 70–99)

## 2018-10-17 ENCOUNTER — Encounter (HOSPITAL_COMMUNITY): Payer: Self-pay | Admitting: Vascular Surgery

## 2018-10-17 ENCOUNTER — Encounter (HOSPITAL_COMMUNITY): Payer: Self-pay | Admitting: *Deleted

## 2018-10-17 ENCOUNTER — Other Ambulatory Visit (HOSPITAL_COMMUNITY)
Admission: RE | Admit: 2018-10-17 | Discharge: 2018-10-17 | Disposition: A | Discharge status: Home / Self Care | Payer: Medicare HMO | Source: Ambulatory Visit | Attending: Plastic Surgery | Admitting: Plastic Surgery

## 2018-10-17 ENCOUNTER — Other Ambulatory Visit: Payer: Self-pay

## 2018-10-17 DIAGNOSIS — E1069 Type 1 diabetes mellitus with other specified complication: Secondary | ICD-10-CM | POA: Diagnosis not present

## 2018-10-17 DIAGNOSIS — Z1159 Encounter for screening for other viral diseases: Secondary | ICD-10-CM | POA: Diagnosis present

## 2018-10-17 LAB — GLUCOSE, CAPILLARY
Glucose-Capillary: 439 mg/dL — ABNORMAL HIGH (ref 70–99)
Glucose-Capillary: 402 mg/dL — ABNORMAL HIGH (ref 70–99)

## 2018-10-17 LAB — SARS CORONAVIRUS 2 (TAT 6-24 HRS): SARS Coronavirus 2: NEGATIVE

## 2018-10-17 NOTE — Progress Notes (Signed)
Spoke with pt for pre-op call. Pt denies cardiac history. Pt is a type 1 diabetic, uncontrolled. A1C was 11.4 on 10/15/18. Pt's endocrinologist is Dr. Dwyane Dee and he adjusted her rates on her insulin pump accordingly on the 15th. Per Dr. Ronnie Derby notes, pt's blood sugar is averaging in the 400's. Pt states she does know how to adjust the basal rate on her insulin pump. I instructed her to decrease it by 20% at midnight Sunday night. I instructed her to check her blood sugar when she gets up and every 2 hours until she leaves for the hospital. If blood sugar is 70 or below, treat with 1/2 cup of clear juice (apple or cranberry) and recheck blood sugar 15 minutes after drinking juice. If blood sugar continues to be 70 or below, call the Short Stay department and ask to speak to a nurse. Pt voiced understanding I notified the Diabetes Coordinator, Harvel Ricks this afternoon about pt's surgery. Pt's surgery is only scheduled for 45 minutes, she states the pump can be left on. She states for staff to call if there are any questions day of surgery.  I spoke to Dr. Ermalene Postin, Anesthesiologist about later start time (2:30 PM). He stated that pt would be NPO of food at midnight, but may have clear liquids as needed until 10 AM if blood sugar is dropping low. I gave these instructions to pt and she voiced understanding.  Pt states Dr. Dwyane Dee is not aware that she is having surgery. She states she saw him on Wednesday and didn't know at the time that she was going to be having surgery.

## 2018-10-20 ENCOUNTER — Other Ambulatory Visit: Payer: Self-pay

## 2018-10-20 ENCOUNTER — Encounter (HOSPITAL_COMMUNITY)
Admission: RE | Disposition: A | Discharge status: Home / Self Care | Payer: Self-pay | Source: Home / Self Care | Attending: Plastic Surgery

## 2018-10-20 ENCOUNTER — Ambulatory Visit (HOSPITAL_COMMUNITY)
Admission: RE | Admit: 2018-10-20 | Discharge: 2018-10-20 | Disposition: A | Discharge status: Home / Self Care | Payer: Medicare HMO | Attending: Plastic Surgery | Admitting: Plastic Surgery

## 2018-10-20 ENCOUNTER — Encounter (HOSPITAL_COMMUNITY): Payer: Self-pay

## 2018-10-20 DIAGNOSIS — Z79899 Other long term (current) drug therapy: Secondary | ICD-10-CM | POA: Insufficient documentation

## 2018-10-20 DIAGNOSIS — E1051 Type 1 diabetes mellitus with diabetic peripheral angiopathy without gangrene: Secondary | ICD-10-CM | POA: Insufficient documentation

## 2018-10-20 DIAGNOSIS — Z86718 Personal history of other venous thrombosis and embolism: Secondary | ICD-10-CM | POA: Diagnosis not present

## 2018-10-20 DIAGNOSIS — N184 Chronic kidney disease, stage 4 (severe): Secondary | ICD-10-CM | POA: Diagnosis not present

## 2018-10-20 DIAGNOSIS — M86671 Other chronic osteomyelitis, right ankle and foot: Secondary | ICD-10-CM | POA: Insufficient documentation

## 2018-10-20 DIAGNOSIS — I129 Hypertensive chronic kidney disease with stage 1 through stage 4 chronic kidney disease, or unspecified chronic kidney disease: Secondary | ICD-10-CM | POA: Diagnosis not present

## 2018-10-20 DIAGNOSIS — E1069 Type 1 diabetes mellitus with other specified complication: Secondary | ICD-10-CM | POA: Diagnosis present

## 2018-10-20 DIAGNOSIS — E1022 Type 1 diabetes mellitus with diabetic chronic kidney disease: Secondary | ICD-10-CM | POA: Diagnosis not present

## 2018-10-20 DIAGNOSIS — E1036 Type 1 diabetes mellitus with diabetic cataract: Secondary | ICD-10-CM | POA: Insufficient documentation

## 2018-10-20 DIAGNOSIS — E7800 Pure hypercholesterolemia, unspecified: Secondary | ICD-10-CM | POA: Insufficient documentation

## 2018-10-20 DIAGNOSIS — E1040 Type 1 diabetes mellitus with diabetic neuropathy, unspecified: Secondary | ICD-10-CM | POA: Diagnosis not present

## 2018-10-20 DIAGNOSIS — Z89431 Acquired absence of right foot: Secondary | ICD-10-CM | POA: Insufficient documentation

## 2018-10-20 DIAGNOSIS — Z794 Long term (current) use of insulin: Secondary | ICD-10-CM | POA: Insufficient documentation

## 2018-10-20 DIAGNOSIS — Z5309 Procedure and treatment not carried out because of other contraindication: Secondary | ICD-10-CM | POA: Diagnosis not present

## 2018-10-20 DIAGNOSIS — Z7982 Long term (current) use of aspirin: Secondary | ICD-10-CM | POA: Diagnosis not present

## 2018-10-20 DIAGNOSIS — G4730 Sleep apnea, unspecified: Secondary | ICD-10-CM | POA: Diagnosis not present

## 2018-10-20 LAB — GLUCOSE, CAPILLARY
Glucose-Capillary: 440 mg/dL — ABNORMAL HIGH (ref 70–99)
Glucose-Capillary: 452 mg/dL — ABNORMAL HIGH (ref 70–99)
Glucose-Capillary: 355 mg/dL — ABNORMAL HIGH (ref 70–99)

## 2018-10-20 LAB — POCT I-STAT 4, (NA,K, GLUC, HGB,HCT)
Glucose, Bld: 488 mg/dL — ABNORMAL HIGH (ref 70–99)
HCT: 35 % — ABNORMAL LOW (ref 36.0–46.0)
Hemoglobin: 11.9 g/dL — ABNORMAL LOW (ref 12.0–15.0)
Potassium: 5 mmol/L (ref 3.5–5.1)
Sodium: 131 mmol/L — ABNORMAL LOW (ref 135–145)

## 2018-10-20 LAB — POCT PREGNANCY, URINE: Preg Test, Ur: NEGATIVE

## 2018-10-20 SURGERY — DEBRIDEMENT, WOUND, WITH CLOSURE
Anesthesia: Choice | Laterality: Right

## 2018-10-20 MED ORDER — CHLORHEXIDINE GLUCONATE CLOTH 2 % EX PADS: 6.0000 | MEDICATED_PAD | Freq: Once | CUTANEOUS | Status: DC

## 2018-10-20 MED ORDER — PROPOFOL 10 MG/ML IV BOLUS
INTRAVENOUS | Status: AC
  Filled 2018-10-20: qty 20

## 2018-10-20 MED ORDER — CIPROFLOXACIN IN D5W 400 MG/200ML IV SOLN
400.0000 mg | INTRAVENOUS | Status: DC
  Filled 2018-10-20: qty 200

## 2018-10-20 MED ORDER — SODIUM CHLORIDE 0.9 % IV SOLN
INTRAVENOUS | Status: DC
  Administered 2018-10-20: 13:00:00 via INTRAVENOUS

## 2018-10-20 MED ORDER — FENTANYL CITRATE (PF) 250 MCG/5ML IJ SOLN
INTRAMUSCULAR | Status: AC
  Filled 2018-10-20: qty 5

## 2018-10-20 NOTE — Progress Notes (Signed)
OR front desk seeking clarification regarding case status. Spoke with Dr Ermalene Postin stated that case was cancelled due to elevated glucose. Dr Marla Roe also contacted and agreed that case was cancelled, verbal order given to cancel case and discharge patient. Patient notified of this and became angry and asked to see physician. Dr Marla Roe was not on site and stated she would speak to the patient tomorrow. Patient states that she does not have transportation home at this time. Call placed to social work [phone] Arbie Cookey) to request ride assistance, no answer, message left.

## 2018-10-20 NOTE — Progress Notes (Signed)
Patient stated "took insulin pump off at 11 this morning". Dr. Ermalene Postin made aware of CBG 440, orders given to instruct patient to put insulin pump back on. Will continue to monitor.

## 2018-10-20 NOTE — Progress Notes (Signed)
CBG 452, MD made aware, no new orders given.

## 2018-10-20 NOTE — Interval H&P Note (Signed)
History and Physical Interval Note:  10/20/2018 1:09 PM  [name]  has presented today for surgery, with the diagnosis of OSTEOMYLITIS OF RIGHT FOOT.  The various methods of treatment have been discussed with the patient and family. After consideration of risks, benefits and other options for treatment, the patient has consented to  Procedure(s): EXCISION OF RIGHT FOOT WOUND WITH PLACEMENT OF INTEGRA (Right) as a surgical intervention.  The patient's history has been reviewed, patient examined, no change in status, stable for surgery.  I have reviewed the patient's chart and labs.  Questions were answered to the patient's satisfaction.     Loel Lofty Dillingham

## 2018-10-21 DIAGNOSIS — E1069 Type 1 diabetes mellitus with other specified complication: Secondary | ICD-10-CM | POA: Diagnosis not present

## 2018-10-21 LAB — GLUCOSE, CAPILLARY: Glucose-Capillary: 346 mg/dL — ABNORMAL HIGH (ref 70–99)

## 2018-10-22 DIAGNOSIS — E1069 Type 1 diabetes mellitus with other specified complication: Secondary | ICD-10-CM | POA: Diagnosis not present

## 2018-10-22 LAB — GLUCOSE, CAPILLARY
Glucose-Capillary: 196 mg/dL — ABNORMAL HIGH (ref 70–99)
Glucose-Capillary: 544 mg/dL (ref 70–99)
Glucose-Capillary: 451 mg/dL — ABNORMAL HIGH (ref 70–99)

## 2018-10-23 DIAGNOSIS — E1069 Type 1 diabetes mellitus with other specified complication: Secondary | ICD-10-CM | POA: Diagnosis not present

## 2018-10-23 LAB — GLUCOSE, CAPILLARY
Glucose-Capillary: 166 mg/dL — ABNORMAL HIGH (ref 70–99)
Glucose-Capillary: 166 mg/dL — ABNORMAL HIGH (ref 70–99)
Glucose-Capillary: 229 mg/dL — ABNORMAL HIGH (ref 70–99)

## 2018-10-24 DIAGNOSIS — E1069 Type 1 diabetes mellitus with other specified complication: Secondary | ICD-10-CM | POA: Diagnosis not present

## 2018-10-27 DIAGNOSIS — E1069 Type 1 diabetes mellitus with other specified complication: Secondary | ICD-10-CM | POA: Diagnosis not present

## 2018-10-27 LAB — GLUCOSE, CAPILLARY
Glucose-Capillary: 320 mg/dL — ABNORMAL HIGH (ref 70–99)
Glucose-Capillary: 281 mg/dL — ABNORMAL HIGH (ref 70–99)

## 2018-10-28 ENCOUNTER — Encounter: Payer: Medicare HMO | Admitting: Surgical

## 2018-10-28 DIAGNOSIS — E1069 Type 1 diabetes mellitus with other specified complication: Secondary | ICD-10-CM | POA: Diagnosis not present

## 2018-10-28 LAB — GLUCOSE, CAPILLARY
Glucose-Capillary: 283 mg/dL — ABNORMAL HIGH (ref 70–99)
Glucose-Capillary: 307 mg/dL — ABNORMAL HIGH (ref 70–99)

## 2018-10-29 ENCOUNTER — Other Ambulatory Visit (HOSPITAL_COMMUNITY): Payer: Self-pay | Admitting: Internal Medicine

## 2018-10-29 ENCOUNTER — Ambulatory Visit (HOSPITAL_COMMUNITY)
Admission: RE | Admit: 2018-10-29 | Discharge: 2018-10-29 | Disposition: A | Discharge status: Home / Self Care | Payer: Medicare HMO | Source: Ambulatory Visit | Attending: Internal Medicine | Admitting: Internal Medicine

## 2018-10-29 ENCOUNTER — Other Ambulatory Visit: Payer: Self-pay

## 2018-10-29 DIAGNOSIS — M86171 Other acute osteomyelitis, right ankle and foot: Secondary | ICD-10-CM | POA: Diagnosis present

## 2018-10-29 DIAGNOSIS — M86172 Other acute osteomyelitis, left ankle and foot: Secondary | ICD-10-CM | POA: Insufficient documentation

## 2018-10-29 DIAGNOSIS — E1069 Type 1 diabetes mellitus with other specified complication: Secondary | ICD-10-CM | POA: Diagnosis not present

## 2018-10-29 LAB — GLUCOSE, CAPILLARY
Glucose-Capillary: 213 mg/dL — ABNORMAL HIGH (ref 70–99)
Glucose-Capillary: 249 mg/dL — ABNORMAL HIGH (ref 70–99)

## 2018-10-30 ENCOUNTER — Telehealth: Payer: Self-pay | Admitting: *Deleted

## 2018-10-30 DIAGNOSIS — E1069 Type 1 diabetes mellitus with other specified complication: Secondary | ICD-10-CM | POA: Diagnosis not present

## 2018-10-30 LAB — GLUCOSE, CAPILLARY
Glucose-Capillary: 502 mg/dL (ref 70–99)
Glucose-Capillary: 354 mg/dL — ABNORMAL HIGH (ref 70–99)

## 2018-10-30 NOTE — Telephone Encounter (Signed)
Patient called asking for resumption of her IV antibiotics (vancomycin per patient) with dialysis.  She states that the oral "aren't strong enough" and she needs to pursue Cure. She states she has been told by Dr Dellia Nims that her foot is salvageable if the infection is cured and is willing to try to cure this.  She states he is the only doctor who has told her there is a possibility of a cure.  She is doing weekly I&D with Dr Dellia Nims. She wants to restart vancomycin at dialysis so that Dr Dellia Nims can cure her infection. She did not pursue amputation that was discussed at her last visit.  She goes to dialysis Tues/Thurs/Saturdays at [facility] location. Please advise.  Landis Gandy, RN

## 2018-10-30 NOTE — Telephone Encounter (Signed)
The linezolid that Dr. Dellia Nims prescribed has a bioavailability of 100% meaning that oral antibiotics are equivalent to IV antibiotics, however certainly not a long term option. Please have her schedule an office visit for further discussion.

## 2018-10-30 NOTE — Telephone Encounter (Signed)
Called patient, left voicemail with Greg's advice and request for her to schedule an appointment.  RN left our phone number for call back.

## 2018-10-31 DIAGNOSIS — E1069 Type 1 diabetes mellitus with other specified complication: Secondary | ICD-10-CM | POA: Diagnosis not present

## 2018-10-31 LAB — GLUCOSE, CAPILLARY
Glucose-Capillary: 65 mg/dL — ABNORMAL LOW (ref 70–99)
Glucose-Capillary: 81 mg/dL (ref 70–99)
Glucose-Capillary: 141 mg/dL — ABNORMAL HIGH (ref 70–99)
Glucose-Capillary: 97 mg/dL (ref 70–99)
Glucose-Capillary: 312 mg/dL — ABNORMAL HIGH (ref 70–99)
Glucose-Capillary: 275 mg/dL — ABNORMAL HIGH (ref 70–99)
Glucose-Capillary: 199 mg/dL — ABNORMAL HIGH (ref 70–99)

## 2018-11-03 ENCOUNTER — Encounter (HOSPITAL_BASED_OUTPATIENT_CLINIC_OR_DEPARTMENT_OTHER): Payer: Medicare HMO | Attending: Internal Medicine

## 2018-11-03 DIAGNOSIS — G4730 Sleep apnea, unspecified: Secondary | ICD-10-CM | POA: Diagnosis not present

## 2018-11-03 DIAGNOSIS — L97412 Non-pressure chronic ulcer of right heel and midfoot with fat layer exposed: Secondary | ICD-10-CM | POA: Diagnosis not present

## 2018-11-03 DIAGNOSIS — I10 Essential (primary) hypertension: Secondary | ICD-10-CM | POA: Insufficient documentation

## 2018-11-03 DIAGNOSIS — E1051 Type 1 diabetes mellitus with diabetic peripheral angiopathy without gangrene: Secondary | ICD-10-CM | POA: Diagnosis not present

## 2018-11-03 DIAGNOSIS — Z86718 Personal history of other venous thrombosis and embolism: Secondary | ICD-10-CM | POA: Insufficient documentation

## 2018-11-03 DIAGNOSIS — L97514 Non-pressure chronic ulcer of other part of right foot with necrosis of bone: Secondary | ICD-10-CM | POA: Diagnosis not present

## 2018-11-03 DIAGNOSIS — E1040 Type 1 diabetes mellitus with diabetic neuropathy, unspecified: Secondary | ICD-10-CM | POA: Insufficient documentation

## 2018-11-03 DIAGNOSIS — E10621 Type 1 diabetes mellitus with foot ulcer: Secondary | ICD-10-CM | POA: Diagnosis not present

## 2018-11-03 DIAGNOSIS — E1069 Type 1 diabetes mellitus with other specified complication: Secondary | ICD-10-CM | POA: Insufficient documentation

## 2018-11-03 DIAGNOSIS — M86671 Other chronic osteomyelitis, right ankle and foot: Secondary | ICD-10-CM | POA: Diagnosis not present

## 2018-11-04 ENCOUNTER — Encounter (HOSPITAL_BASED_OUTPATIENT_CLINIC_OR_DEPARTMENT_OTHER): Payer: Medicaid Other

## 2018-11-04 LAB — GLUCOSE, CAPILLARY: Glucose-Capillary: 152 mg/dL — ABNORMAL HIGH (ref 70–99)

## 2018-11-05 DIAGNOSIS — E1069 Type 1 diabetes mellitus with other specified complication: Secondary | ICD-10-CM | POA: Diagnosis not present

## 2018-11-06 DIAGNOSIS — E1069 Type 1 diabetes mellitus with other specified complication: Secondary | ICD-10-CM | POA: Diagnosis not present

## 2018-11-06 LAB — GLUCOSE, CAPILLARY
Glucose-Capillary: 260 mg/dL — ABNORMAL HIGH (ref 70–99)
Glucose-Capillary: 438 mg/dL — ABNORMAL HIGH (ref 70–99)
Glucose-Capillary: 256 mg/dL — ABNORMAL HIGH (ref 70–99)
Glucose-Capillary: 259 mg/dL — ABNORMAL HIGH (ref 70–99)

## 2018-11-07 DIAGNOSIS — E1069 Type 1 diabetes mellitus with other specified complication: Secondary | ICD-10-CM | POA: Diagnosis not present

## 2018-11-10 ENCOUNTER — Other Ambulatory Visit: Payer: Self-pay

## 2018-11-10 ENCOUNTER — Encounter: Payer: Self-pay | Admitting: Family

## 2018-11-10 ENCOUNTER — Ambulatory Visit (INDEPENDENT_AMBULATORY_CARE_PROVIDER_SITE_OTHER): Payer: Medicare HMO | Admitting: Family

## 2018-11-10 VITALS — BP 123/75 | HR 82 | Temp 98.0°F

## 2018-11-10 DIAGNOSIS — M86471 Chronic osteomyelitis with draining sinus, right ankle and foot: Secondary | ICD-10-CM

## 2018-11-10 DIAGNOSIS — N186 End stage renal disease: Secondary | ICD-10-CM

## 2018-11-10 DIAGNOSIS — E1051 Type 1 diabetes mellitus with diabetic peripheral angiopathy without gangrene: Secondary | ICD-10-CM

## 2018-11-10 DIAGNOSIS — E1069 Type 1 diabetes mellitus with other specified complication: Secondary | ICD-10-CM | POA: Diagnosis not present

## 2018-11-10 LAB — GLUCOSE, CAPILLARY
Glucose-Capillary: 422 mg/dL — ABNORMAL HIGH (ref 70–99)
Glucose-Capillary: 345 mg/dL — ABNORMAL HIGH (ref 70–99)
Glucose-Capillary: 200 mg/dL — ABNORMAL HIGH (ref 70–99)
Glucose-Capillary: 276 mg/dL — ABNORMAL HIGH (ref 70–99)

## 2018-11-10 MED ORDER — SULFAMETHOXAZOLE-TRIMETHOPRIM 800-160 MG PO TABS: 1.0000 | ORAL_TABLET | Freq: Two times a day (BID) | ORAL | 0 refills | Status: DC

## 2018-11-10 NOTE — Patient Instructions (Signed)
Nice to see you.  We will check your blood work today.  Please complete your Linezolid as prescribed.  We will transition you to Bactrim.  Follow up with plastic surgery when available.  Continue to work with Dr. Dellia Nims.  Follow up in 1 month or sooner if needed.

## 2018-11-10 NOTE — Assessment & Plan Note (Signed)
[name] has osteomyelitis of the right foot status post incision and drainage complicated by bacteremia.  She is currently completing 2 weeks of linezolid.  Will check platelet level today.  She has been seen by multiple surgeons and all in consensus regarding the need for below-knee amputation.  [name] is hopeful that this can possibly be avoided.  I will check her CRP and ESR today.  It is highly unlikely any additional antibiotics will help this to heal and agree that this will ultimately end in below-knee amputation.  We discussed the concern for recurrent bacteremia and possibility of seeding her newly implanted dialysis catheter.  There remains a significant risk for infection given her continued osteomyelitis.  Unfortunately resistant to doxycycline.  We will change her to Bactrim while she awaits further evaluation and possible intervention by plastic surgery.  Continue wound care.  Follow-up in 1 month or sooner if needed.

## 2018-11-10 NOTE — Assessment & Plan Note (Signed)
Ms. Gable continues to have poorly controlled type 1 diabetes with most recent A1c of 11.4.  Poor blood sugar control complicates wound healing and increases risk of infection.  We discussed the importance of working to control blood sugars to provide the most optimal environment.  Continue management per endocrinology.

## 2018-11-10 NOTE — Progress Notes (Signed)
Subjective:    Patient ID: [name], female    DOB: 01/11/1971, 48 y.o.   MRN: [id number]  Chief Complaint  Patient presents with   Follow-up     HPI:  [name] is a 48 y.o. female with history of poorly controlled Type 1 diabetes and MRSA osteomyelitis of the right foot complicated with bacteremia s/p incision and drainage on 08/04/18 and completion of 6 weeks of Vancomycin with dialysis who was last seen in the office on 09/11/2018 following completion of antimicrobial therapy with plans for below-knee amputation.  Since that office visit she is been in continued care with wound care and has restarted on hyperbaric oxygen therapy as well as a 2-week course of Zyvox.  She was also seen by orthopedics on 10/31/18 with recommendation for below-knee amputation and concern for bones ability to heal.  She was also seen by plastic surgery on 10/10/2018 with concern for salvageablilty of her right lower extremity but was willing to attempt treatment.  [name] has been taking her Zyvox as prescribed with no adverse side effects or missed doses.  She has had improvements in her wounds and notes that her home health nurse/wound are excited about the granulation tissue formation.  No systemic symptoms of fevers, chills, or sweats.  She has a wound VAC in place.  There is a secondary area on her right posterior heel and she has concerns about her skin peeling.  Allergies  Allergen Reactions   Cleocin [Clindamycin Hcl] Diarrhea   Lisinopril Other (See Comments)    Elevated potassium per pt report   Amoxicillin Diarrhea, Nausea Only and Other (See Comments)    Has patient had a PCN reaction causing immediate rash, facial/tongue/throat swelling, SOB or lightheadedness with hypotension: No Has patient had a PCN reaction causing severe rash involving mucus membranes or skin necrosis: No Has patient had a PCN reaction that required hospitalization: No Has patient had a PCN reaction  occurring within the last 10 years: No If all of the above answers are "NO", then may proceed with Cephalosporin use.    Bactrim [Sulfamethoxazole-Trimethoprim] Diarrhea and Nausea Only      Outpatient Medications Prior to Visit  Medication Sig Dispense Refill   albuterol (PROVENTIL HFA;VENTOLIN HFA) 108 (90 BASE) MCG/ACT inhaler Inhale 2 puffs into the lungs every 6 (six) hours as needed for wheezing or shortness of breath. Reported on 04/15/2015     amLODipine (NORVASC) 5 MG tablet Take 1 tablet (5 mg total) by mouth daily. (Patient taking differently: Take 5 mg by mouth every evening. ) 30 tablet 0   aspirin 81 MG tablet Take 81 mg by mouth every evening.      calcitRIOL (ROCALTROL) 0.5 MCG capsule Take 1 capsule daily (Patient taking differently: Take 0.5 mcg by mouth daily. ) 90 capsule 2   calcium acetate (PHOSLO) 667 MG capsule Take 2 capsules (1,334 mg total) by mouth 3 (three) times daily with meals. 180 capsule 0   CINNAMON PO Take 1 tablet by mouth every evening.      Continuous Blood Gluc Receiver (FREESTYLE LIBRE READER) DEVI 1 Device by Does not apply route as directed. 1 Device 1   Continuous Blood Gluc Sensor (FREESTYLE LIBRE SENSOR SYSTEM) MISC Use 1 sensor every 10 days 3 each 4   CONTOUR NEXT TEST test strip USE AS INSTRUCTED TO CHECK BLOOD SUGAR 4 TIMES A DAY. DX E10.65 169 strip 4   folic acid (FOLVITE) 1 MG tablet Take 1  mg by mouth every evening.      HUMALOG 100 UNIT/ML injection CALL MD IF <70, IF 151-200 =2UNITS,201-250 =4 UNITS,251-300 =6 UNITS, 301-350 =8 UNITS,351-400 =10 U (Patient taking differently: INSULIN PUMP) 30 mL 0   linezolid (ZYVOX) 600 MG tablet Take 600 mg by mouth 2 (two) times daily.     pravastatin (PRAVACHOL) 40 MG tablet TAKE 1 TABLET BY MOUTH EVERY DAY (Patient taking differently: Take 40 mg by mouth daily. ) 90 tablet 1   pyridoxine (B-6) 100 MG tablet Take 100 mg by mouth daily. Reported on 04/15/2015     TURMERIC PO Take 1  capsule by mouth daily.     No facility-administered medications prior to visit.      Past Medical History:  Diagnosis Date   Anemia    Arthritis    Bronchitis    C. difficile diarrhea 09/26/2014   Cataracts, both eyes    Cellulitis of right foot 06/02/2014   CKD (chronic kidney disease) stage 3, GFR 30-59 ml/min (HCC)    Dialysis T, TH, Sat   Diabetic ulcer of right foot ([place]) 06/02/2014   DVT (deep venous thrombosis) ([place]) 10/2014   one in each one in leg- PICC line , one in right and left arm   H/O seasonal allergies    Heart murmur    told once when she was pregnant- no furter mention- was in notes 11/2015- had Echo 8 /4/17   History of blood transfusion    Hypercholesteremia    Hypertension    Left foot infection    Neuropathy    feet   Neuropathy in diabetes [facility])    Peripheral vascular disease (HCC)    Sleep apnea    not able to use CPAP   Staphylococcus aureus bacteremia 10/2014   Type 1 diabetes [facility])    onset age 37     Past Surgical History:  Procedure Laterality Date   AMPUTATION TOE Left 07/24/2015   5th toe and Hallux amputation   AV FISTULA PLACEMENT Left 08/06/2018   Procedure: INSERTION OF BASCILIC VEN TRANSPOSITION 1ST STAGE;  Surgeon: Rosetta Posner, MD;  Location: [facility] OR;  Service: Vascular;  Laterality: Left;   CESAREAN SECTION     EXOSTECTECTOMY TOE Left 04/11/2016   Procedure: EXOSTECTECTOMY CUBOID AND 5TH METATARSAL AND PLACEMENT OF ANTIBIOTIC BEADS;  Surgeon: Edrick Kins, DPM;  Location: [place];  Service: Podiatry;  Laterality: Left;   EYE SURGERY Bilateral    [place]   FEMORAL-POPLITEAL BYPASS GRAFT Left 05/03/2015   FOOT AMPUTATION THROUGH METATARSAL Right 2016   hemrrhoidectomy     I&D EXTREMITY Right 06/10/2014   Procedure: IRRIGATION AND DEBRIDEMENT Right Foot;  Surgeon: Newt Minion, MD;  Location: [place];  Service: Orthopedics;  Laterality: Right;   I&D EXTREMITY Right 07/31/2018   Procedure: IRRIGATION AND  DEBRIDEMENT EXTREMITY;  Surgeon: Edrick Kins, DPM;  Location: [place];  Service: Podiatry;  Laterality: Right;   I&D EXTREMITY Right 08/04/2018   Procedure: IRRIGATION AND DEBRIDEMENT FOOT with placement of wound vac;  Surgeon: Edrick Kins, DPM;  Location: [place];  Service: Podiatry;  Laterality: Right;   INCISION AND DRAINAGE Left 04/11/2016   Procedure: INCISION AND DRAINAGE A DEEP COMPLICATED WOUND OF LEFT FOOT;  Surgeon: Edrick Kins, DPM;  Location: [place];  Service: Podiatry;  Laterality: Left;   INSERTION OF DIALYSIS CATHETER Right 08/06/2018   Procedure: INSERTION OF TUNNELED DIALYSIS CATHETER;  Surgeon: Rosetta Posner, MD;  Location:  [facility] OR;  Service: Vascular;  Laterality: Right;   IR FLUORO GUIDE CV LINE RIGHT  09/24/2017   IR FLUORO GUIDE CV LINE RIGHT  08/01/2018   IR REMOVAL TUN CV CATH W/O FL  11/22/2017   IR US GUIDE VASC ACCESS RIGHT  09/24/2017   IR US GUIDE VASC ACCESS RIGHT  08/01/2018   IRRIGATION AND DEBRIDEMENT ABSCESS Left 05/06/2017   Procedure: IRRIGATION AND DEBRIDEMENT ABSCESS LEFT FOOT;  Surgeon: Edrick Kins, DPM;  Location: [facility];  Service: Podiatry;  Laterality: Left;   PERIPHERALLY INSERTED CENTRAL CATHETER INSERTION     SKIN GRAFT Right 06/15/2014   SKIN GRAFT Left 06/2015   foot    SKIN SPLIT GRAFT Right 06/15/2014   Procedure: SPLIT THICKNESS SKIN GRAFT RIGHT FOOT;  Surgeon: Newt Minion, MD;  Location: [place];  Service: Orthopedics;  Laterality: Right;   TRANSMETATARSAL AMPUTATION Right    TRANSMETATARSAL AMPUTATION Right 09/11/2014       Review of Systems  Constitutional: Negative for chills, fatigue and fever.  Eyes:       Denies changes in vision  Respiratory: Negative for chest tightness and shortness of breath.   Cardiovascular: Negative for chest pain, palpitations and leg swelling.  Endocrine: Negative for polydipsia, polyphagia and polyuria.  Skin: Positive for wound.  Neurological: Negative for numbness.      Objective:    BP  123/75    Pulse 82    Temp 98 F (36.7 C) (Oral)    LMP 08/31/2018  Nursing note and vital signs reviewed.  Physical Exam Constitutional:      General: She is not in acute distress.    Appearance: She is well-developed. She is ill-appearing.     Comments: Seated in the wheelchair; pleasant  Cardiovascular:     Rate and Rhythm: Normal rate and regular rhythm.     Heart sounds: Normal heart sounds.  Pulmonary:     Effort: Pulmonary effort is normal.     Breath sounds: Normal breath sounds.  Musculoskeletal:     Comments: Wound VAC noted to right lower extremity.  Patent.  Skin:    General: Skin is warm and dry.  Neurological:     Mental Status: She is alert.  Psychiatric:        Mood and Affect: Mood normal.      Depression screen [facility] 2/9 12/31/2017 09/10/2017 08/08/2017 06/03/2017 05/02/2017  Decreased Interest 0 0 0 0 0  Down, Depressed, Hopeless 0 0 0 0 0  PHQ - 2 Score 0 0 0 0 0  Some recent data might be hidden       Assessment & Plan:   Problem List Items Addressed This Visit      Cardiovascular and Mediastinum   Diabetes mellitus type 1 with peripheral artery disease ([place])    Ms. Wigington continues to have poorly controlled type 1 diabetes with most recent A1c of 11.4.  Poor blood sugar control complicates wound healing and increases risk of infection.  We discussed the importance of working to control blood sugars to provide the most optimal environment.  Continue management per endocrinology.        Musculoskeletal and Integument   Osteomyelitis of right foot ([place]) - Primary    Ms. Hukill has osteomyelitis of the right foot status post incision and drainage complicated by bacteremia.  She is currently completing 2 weeks of linezolid.  Will check platelet level today.  She has been seen by multiple surgeons and all in  consensus regarding the need for below-knee amputation.  Ms. Maguire is hopeful that this can possibly be avoided.  I will check her CRP and ESR today.   It is highly unlikely any additional antibiotics will help this to heal and agree that this will ultimately end in below-knee amputation.  We discussed the concern for recurrent bacteremia and possibility of seeding her newly implanted dialysis catheter.  There remains a significant risk for infection given her continued osteomyelitis.  Unfortunately resistant to doxycycline.  We will change her to Bactrim while she awaits further evaluation and possible intervention by plastic surgery.  Continue wound care.  Follow-up in 1 month or sooner if needed.      Relevant Medications   sulfamethoxazole-trimethoprim (BACTRIM DS) 800-160 MG tablet   Other Relevant Orders   C-reactive protein   Sedimentation rate   CBC w/Diff     Genitourinary   ESRD (end stage renal disease) ([place])    Has tunneled dialysis catheter and attending dialysis 3 times a week managed by nephrology.          I am having Keeli L. [name] "Leah" start on sulfamethoxazole-trimethoprim. I am also having her maintain her albuterol, pyridoxine, aspirin, folic acid, CINNAMON PO, FreeStyle Libre Reader, [employer] System, amLODipine, calcitRIOL, HumaLOG, pravastatin, TURMERIC PO, calcium acetate, Contour Next Test, and linezolid.   Meds ordered this encounter  Medications   sulfamethoxazole-trimethoprim (BACTRIM DS) 800-160 MG tablet    Sig: Take 1 tablet by mouth 2 (two) times daily.    Dispense:  60 tablet    Refill:  0    Order Specific Question:   Supervising Provider    Answer:   Carlyle Basques [[id number]]     Follow-up: Return in about 1 month (around 12/11/2018), or if symptoms worsen or fail to improve.   Terri Piedra, MSN, FNP-C Nurse Practitioner [facility] [place] number: [phone]

## 2018-11-10 NOTE — Assessment & Plan Note (Signed)
Has tunneled dialysis catheter and attending dialysis 3 times a week managed by nephrology.

## 2018-11-11 DIAGNOSIS — E1069 Type 1 diabetes mellitus with other specified complication: Secondary | ICD-10-CM | POA: Diagnosis not present

## 2018-11-11 LAB — CBC WITH DIFFERENTIAL/PLATELET
Absolute Monocytes: 470 cells/uL (ref 200–950)
Basophils Absolute: 18 cells/uL (ref 0–200)
Basophils Relative: 0.3 %
Eosinophils Absolute: 287 cells/uL (ref 15–500)
Eosinophils Relative: 4.7 %
HCT: 31.2 % — ABNORMAL LOW (ref 35.0–45.0)
Hemoglobin: 9.3 g/dL — ABNORMAL LOW (ref 11.7–15.5)
Lymphs Abs: 1488 cells/uL (ref 850–3900)
MCH: 22 pg — ABNORMAL LOW (ref 27.0–33.0)
MCHC: 29.8 g/dL — ABNORMAL LOW (ref 32.0–36.0)
MCV: 73.9 fL — ABNORMAL LOW (ref 80.0–100.0)
MPV: 9.6 fL (ref 7.5–12.5)
Monocytes Relative: 7.7 %
Neutro Abs: 3837 cells/uL (ref 1500–7800)
Neutrophils Relative %: 62.9 %
Platelets: 370 10*3/uL (ref 140–400)
RBC: 4.22 10*6/uL (ref 3.80–5.10)
RDW: 21.5 % — ABNORMAL HIGH (ref 11.0–15.0)
Total Lymphocyte: 24.4 %
WBC: 6.1 10*3/uL (ref 3.8–10.8)

## 2018-11-11 LAB — SEDIMENTATION RATE: Sed Rate: 56 mm/h — ABNORMAL HIGH (ref 0–20)

## 2018-11-11 LAB — GLUCOSE, CAPILLARY
Glucose-Capillary: 162 mg/dL — ABNORMAL HIGH (ref 70–99)
Glucose-Capillary: 216 mg/dL — ABNORMAL HIGH (ref 70–99)

## 2018-11-11 LAB — CBC MORPHOLOGY

## 2018-11-11 LAB — C-REACTIVE PROTEIN: CRP: 9.9 mg/L — ABNORMAL HIGH (ref <8.0)

## 2018-11-12 DIAGNOSIS — E1069 Type 1 diabetes mellitus with other specified complication: Secondary | ICD-10-CM | POA: Diagnosis not present

## 2018-11-12 LAB — GLUCOSE, CAPILLARY
Glucose-Capillary: 261 mg/dL — ABNORMAL HIGH (ref 70–99)
Glucose-Capillary: 192 mg/dL — ABNORMAL HIGH (ref 70–99)

## 2018-11-13 DIAGNOSIS — E1069 Type 1 diabetes mellitus with other specified complication: Secondary | ICD-10-CM | POA: Diagnosis not present

## 2018-11-14 DIAGNOSIS — E1069 Type 1 diabetes mellitus with other specified complication: Secondary | ICD-10-CM | POA: Diagnosis not present

## 2018-11-14 LAB — GLUCOSE, CAPILLARY
Glucose-Capillary: 136 mg/dL — ABNORMAL HIGH (ref 70–99)
Glucose-Capillary: 255 mg/dL — ABNORMAL HIGH (ref 70–99)

## 2018-11-17 DIAGNOSIS — E1069 Type 1 diabetes mellitus with other specified complication: Secondary | ICD-10-CM | POA: Diagnosis not present

## 2018-11-17 LAB — GLUCOSE, CAPILLARY
Glucose-Capillary: 420 mg/dL — ABNORMAL HIGH (ref 70–99)
Glucose-Capillary: 541 mg/dL (ref 70–99)

## 2018-11-18 ENCOUNTER — Other Ambulatory Visit: Payer: Self-pay

## 2018-11-18 DIAGNOSIS — E1069 Type 1 diabetes mellitus with other specified complication: Secondary | ICD-10-CM | POA: Diagnosis not present

## 2018-11-18 LAB — GLUCOSE, CAPILLARY
Glucose-Capillary: 304 mg/dL — ABNORMAL HIGH (ref 70–99)
Glucose-Capillary: 196 mg/dL — ABNORMAL HIGH (ref 70–99)
Glucose-Capillary: 147 mg/dL — ABNORMAL HIGH (ref 70–99)
Glucose-Capillary: 274 mg/dL — ABNORMAL HIGH (ref 70–99)

## 2018-11-19 DIAGNOSIS — E1069 Type 1 diabetes mellitus with other specified complication: Secondary | ICD-10-CM | POA: Diagnosis not present

## 2018-11-19 LAB — GLUCOSE, CAPILLARY
Glucose-Capillary: 176 mg/dL — ABNORMAL HIGH (ref 70–99)
Glucose-Capillary: 333 mg/dL — ABNORMAL HIGH (ref 70–99)

## 2018-11-20 DIAGNOSIS — E1069 Type 1 diabetes mellitus with other specified complication: Secondary | ICD-10-CM | POA: Diagnosis not present

## 2018-11-20 LAB — GLUCOSE, CAPILLARY
Glucose-Capillary: 204 mg/dL — ABNORMAL HIGH (ref 70–99)
Glucose-Capillary: 482 mg/dL — ABNORMAL HIGH (ref 70–99)

## 2018-11-21 DIAGNOSIS — E1069 Type 1 diabetes mellitus with other specified complication: Secondary | ICD-10-CM | POA: Diagnosis not present

## 2018-11-24 DIAGNOSIS — E1069 Type 1 diabetes mellitus with other specified complication: Secondary | ICD-10-CM | POA: Diagnosis not present

## 2018-11-24 LAB — GLUCOSE, CAPILLARY
Glucose-Capillary: 162 mg/dL — ABNORMAL HIGH (ref 70–99)
Glucose-Capillary: 99 mg/dL (ref 70–99)
Glucose-Capillary: 380 mg/dL — ABNORMAL HIGH (ref 70–99)
Glucose-Capillary: 521 mg/dL (ref 70–99)
Glucose-Capillary: 450 mg/dL — ABNORMAL HIGH (ref 70–99)

## 2018-11-26 DIAGNOSIS — E1069 Type 1 diabetes mellitus with other specified complication: Secondary | ICD-10-CM | POA: Diagnosis not present

## 2018-11-26 LAB — GLUCOSE, CAPILLARY
Glucose-Capillary: 598 mg/dL (ref 70–99)
Glucose-Capillary: 447 mg/dL — ABNORMAL HIGH (ref 70–99)

## 2018-11-27 DIAGNOSIS — E1069 Type 1 diabetes mellitus with other specified complication: Secondary | ICD-10-CM | POA: Diagnosis not present

## 2018-11-27 LAB — GLUCOSE, CAPILLARY
Glucose-Capillary: 255 mg/dL — ABNORMAL HIGH (ref 70–99)
Glucose-Capillary: 165 mg/dL — ABNORMAL HIGH (ref 70–99)

## 2018-11-28 DIAGNOSIS — E1069 Type 1 diabetes mellitus with other specified complication: Secondary | ICD-10-CM | POA: Diagnosis not present

## 2018-11-28 LAB — GLUCOSE, CAPILLARY
Glucose-Capillary: 170 mg/dL — ABNORMAL HIGH (ref 70–99)
Glucose-Capillary: 130 mg/dL — ABNORMAL HIGH (ref 70–99)

## 2018-12-01 ENCOUNTER — Telehealth: Payer: Self-pay

## 2018-12-01 DIAGNOSIS — E1069 Type 1 diabetes mellitus with other specified complication: Secondary | ICD-10-CM | POA: Diagnosis not present

## 2018-12-01 LAB — GLUCOSE, CAPILLARY
Glucose-Capillary: >600 mg/dL (ref 70–99)
Glucose-Capillary: >600 mg/dL (ref 70–99)

## 2018-12-01 NOTE — Telephone Encounter (Signed)
Given verbal instructions for approving the hyperbaric treatment to the center

## 2018-12-01 NOTE — Telephone Encounter (Signed)
Noted  

## 2018-12-01 NOTE — Telephone Encounter (Signed)
Pt called and stated that they will not let her do her hyperbaric treatment right now because her blood sugar is over 600. Pt stated that her sugar is so high because she ate [employer] and several muffins this morning. She would like to know if there is any adverse effects possible from hyperglycemia during hyperbaric treatments that would keep her from doing her treatment right now.

## 2018-12-01 NOTE — Telephone Encounter (Signed)
Call to pt per Dr. Eusebio Friendly request to inform her that Dr. Marla Roe & Dr. Doran Durand both agree with the recommendation for amputation v/s further wound intervention Dr. Marla Roe did express concern for pt's health & possible life threatening condition. Pt expressed her reluctance to amputate & is seeking further treatment via " maggots"  from a lab in [place]. I did offer any assistance we could provide to her in the future- & I assured her that I would inform Dr. Marla Roe of this conversation. [employer]

## 2018-12-01 NOTE — Telephone Encounter (Signed)
I am unaware of any interactions with hyperbaric.  She needs to take a correction bolus now anyway and likely need to change her infusion set when she gets home if needed

## 2018-12-01 NOTE — Telephone Encounter (Signed)
Pt gave herself 7 units and her pump will not allow her to give anymore insulin. Pt would like Dr. Dwyane Dee to speak with Dr. Dellia Nims at wound care at [facility] and give approval for her to have the hyperbaric treatment right now. His number is [phone]

## 2018-12-02 ENCOUNTER — Encounter (HOSPITAL_BASED_OUTPATIENT_CLINIC_OR_DEPARTMENT_OTHER): Payer: Medicare HMO | Attending: Internal Medicine

## 2018-12-02 ENCOUNTER — Other Ambulatory Visit: Payer: Self-pay

## 2018-12-02 DIAGNOSIS — L97514 Non-pressure chronic ulcer of other part of right foot with necrosis of bone: Secondary | ICD-10-CM | POA: Insufficient documentation

## 2018-12-02 DIAGNOSIS — Z86718 Personal history of other venous thrombosis and embolism: Secondary | ICD-10-CM | POA: Diagnosis not present

## 2018-12-02 DIAGNOSIS — I10 Essential (primary) hypertension: Secondary | ICD-10-CM | POA: Insufficient documentation

## 2018-12-02 DIAGNOSIS — E1040 Type 1 diabetes mellitus with diabetic neuropathy, unspecified: Secondary | ICD-10-CM | POA: Insufficient documentation

## 2018-12-02 DIAGNOSIS — E1051 Type 1 diabetes mellitus with diabetic peripheral angiopathy without gangrene: Secondary | ICD-10-CM | POA: Insufficient documentation

## 2018-12-02 DIAGNOSIS — E1069 Type 1 diabetes mellitus with other specified complication: Secondary | ICD-10-CM | POA: Diagnosis not present

## 2018-12-02 DIAGNOSIS — G4730 Sleep apnea, unspecified: Secondary | ICD-10-CM | POA: Insufficient documentation

## 2018-12-02 DIAGNOSIS — Z992 Dependence on renal dialysis: Secondary | ICD-10-CM | POA: Insufficient documentation

## 2018-12-02 DIAGNOSIS — L97412 Non-pressure chronic ulcer of right heel and midfoot with fat layer exposed: Secondary | ICD-10-CM | POA: Diagnosis not present

## 2018-12-02 DIAGNOSIS — M86671 Other chronic osteomyelitis, right ankle and foot: Secondary | ICD-10-CM | POA: Insufficient documentation

## 2018-12-02 LAB — GLUCOSE, CAPILLARY
Glucose-Capillary: 150 mg/dL — ABNORMAL HIGH (ref 70–99)
Glucose-Capillary: 322 mg/dL — ABNORMAL HIGH (ref 70–99)

## 2018-12-03 ENCOUNTER — Encounter: Payer: Self-pay | Admitting: Endocrinology

## 2018-12-03 ENCOUNTER — Ambulatory Visit (INDEPENDENT_AMBULATORY_CARE_PROVIDER_SITE_OTHER): Payer: Medicare HMO | Admitting: Endocrinology

## 2018-12-03 VITALS — BP 110/70 | HR 88 | Ht 67.0 in | Wt 119.4 lb

## 2018-12-03 DIAGNOSIS — E1065 Type 1 diabetes mellitus with hyperglycemia: Secondary | ICD-10-CM | POA: Diagnosis not present

## 2018-12-03 DIAGNOSIS — E1069 Type 1 diabetes mellitus with other specified complication: Secondary | ICD-10-CM | POA: Diagnosis not present

## 2018-12-03 LAB — GLUCOSE, CAPILLARY
Glucose-Capillary: 221 mg/dL — ABNORMAL HIGH (ref 70–99)
Glucose-Capillary: 264 mg/dL — ABNORMAL HIGH (ref 70–99)

## 2018-12-03 NOTE — Progress Notes (Signed)
Patient ID: [name], female   DOB: Aug 07, 1970, 48 y.o.   MRN: [id number]   Reason for Appointment: follow-up of diabetes  History of Present Illness   Diagnosis: Type 1 DIABETES MELITUS  Previous history: She has had long-standing poorly controlled diabetes and typically has poor compliance with self care measures despite periodic diabetes education and periodic followup  INSULIN PUMP regimen:   Currently using MEDTRONIC 723 model   Basal rates: Midnight to 6 AM = 0.5, 6 AM-11 AM: 0.55, 11 AM-1 PM = 0.45, 1 PM-3 PM = 0.85, 3 PM-12 AM = 0.75    Carb coverage 1: 20 g and correction 1: 70 during the day and 90 from 12 AM -8 AM Glucose target 150, active insulin 4 hours         Recent history:   Her last A1c was 11.4  Current blood sugar patterns, difficulties with management and problems identified:   She was told to check on the T-Slim pump but she is still not able to figure this out and may not be able to get this covered as a nonpreferred pump  She is using Contour strips now  Despite previous instructions she is still suspending her pump overnight or during the day when she has low normal or low readings  This is despite previous instructions not to do so  She even suspended her pump last Thursday when blood sugar was 149 at 9 AM until midnight  She also has to suspend her pump for her hyperbaric treatment but sometimes will suspend this for a total of 4 hours  As usual most of her blood sugars are HIGH at all different times  She may have occasional good readings around 10 AM or around 11 PM sporadically  Apparently on Saturdays she tends to have lower readings when she comes back from dialysis because she is exhausted and does not eat  Apparently when she is using the renal protein drink her blood sugars do not go up much or even without boluses  Otherwise BOLUSES appear to be erratic and either for high blood sugars and occasionally for her  meals  She thinks that POSTPRANDIAL readings are frequently higher except when she overestimates her carbohydrates  HYPOGLYCEMIA has been minimal with only readings in the 60s on 8/29 throughout the day but otherwise not low  Currently checking her blood sugars but not always entering the readings in the pump unless there is a bolus involved and not clear if she is bolusing for all high sugars  Her weight is down slightly     PRE-MEAL  morning Lunch  6 PM-12 AM  12 AM-6 AM Overall  Glucose range:  62-597  71-600+  68-600+  176-600+   Mean/median:  285   300+     POST-MEAL PC Breakfast  afternoon PC Dinner  Glucose range:   59-564   Mean/median:   265      Meals: eating at variable times and also having snacks periodically.  Her daily carbohydrate intake is quite variable Physical activity: exercise: none         Dietician visit: Most recent:?          Complications: are: Neuropathy, nephropathy, diabetic foot ulcer, amputation    Wt Readings from Last 3 Encounters:  12/03/18 119 lb 6.4 oz (54.2 kg)  10/20/18 122 lb (55.3 kg)  10/15/18 122 lb 3.2 oz (55.4 kg)   Lab Results  Component Value Date   HGBA1C  11.4 (A) 10/15/2018   HGBA1C 10.4 (H) 07/30/2018   HGBA1C 10.3 (H) 06/12/2018   Lab Results  Component Value Date   MICROALBUR 304.2 (H) 11/11/2017   LDLCALC 78 05/02/2016   CREATININE 3.81 (H) 08/08/2018   Appointment on 12/02/2018  Component Date Value Ref Range Status  . Glucose-Capillary 12/02/2018 150* 70 - 99 mg/dL Final  . Glucose-Capillary 12/02/2018 322* 70 - 99 mg/dL Final    Other problems addressed:: See review of systems   Allergies as of 12/03/2018      Reactions   Cleocin [clindamycin Hcl] Diarrhea   Lisinopril Other (See Comments)   Elevated potassium per pt report   Amoxicillin Diarrhea, Nausea Only, Other (See Comments)   Has patient had a PCN reaction causing immediate rash, facial/tongue/throat swelling, SOB or lightheadedness with  hypotension: No Has patient had a PCN reaction causing severe rash involving mucus membranes or skin necrosis: No Has patient had a PCN reaction that required hospitalization: No Has patient had a PCN reaction occurring within the last 10 years: No If all of the above answers are "NO", then may proceed with Cephalosporin use.   Bactrim [sulfamethoxazole-trimethoprim] Diarrhea, Nausea Only      Medication List       Accurate as of December 03, 2018 10:24 AM. If you have any questions, ask your nurse or doctor.        STOP taking these medications   [employer] Libre Reader Kerrin Mo Stopped by: Elayne Snare, MD   [place] by: Elayne Snare, MD     TAKE these medications   albuterol 108 (90 Base) MCG/ACT inhaler Commonly known as: VENTOLIN HFA Inhale 2 puffs into the lungs every 6 (six) hours as needed for wheezing or shortness of breath. Reported on 04/15/2015   amLODipine 5 MG tablet Commonly known as: NORVASC Take 1 tablet (5 mg total) by mouth daily.   aspirin 81 MG tablet Take 81 mg by mouth every evening.   calcitRIOL 0.5 MCG capsule Commonly known as: ROCALTROL Take 1 capsule daily   calcium acetate 667 MG capsule Commonly known as: PHOSLO Take 2 capsules (1,334 mg total) by mouth 3 (three) times daily with meals.   CINNAMON PO Take 1 tablet by mouth every evening.   Contour Next Test test strip Generic drug: glucose blood USE AS INSTRUCTED TO CHECK BLOOD SUGAR 4 TIMES A DAY. DX L84.53   folic acid 1 MG tablet Commonly known as: FOLVITE Take 1 mg by mouth every evening.   HumaLOG 100 UNIT/ML injection Generic drug: insulin lispro CALL MD IF <70, IF 151-200 =2UNITS,201-250 =4 UNITS,251-300 =6 UNITS, 301-350 =8 UNITS,351-400 =10 U What changed: See the new instructions.   linezolid 600 MG tablet Commonly known as: ZYVOX Take 600 mg by mouth 2 (two) times daily.   pravastatin 40 MG tablet Commonly known as: PRAVACHOL TAKE 1  TABLET BY MOUTH EVERY DAY   pyridoxine 100 MG tablet Commonly known as: B-6 Take 100 mg by mouth daily. Reported on 04/15/2015   sulfamethoxazole-trimethoprim 800-160 MG tablet Commonly known as: BACTRIM DS Take 1 tablet by mouth 2 (two) times daily.   TURMERIC PO Take 1 capsule by mouth daily.       Allergies:  Allergies  Allergen Reactions  . Cleocin [Clindamycin Hcl] Diarrhea  . Lisinopril Other (See Comments)    Elevated potassium per pt report  . Amoxicillin Diarrhea, Nausea Only and Other (See Comments)    Has patient had a PCN  reaction causing immediate rash, facial/tongue/throat swelling, SOB or lightheadedness with hypotension: No Has patient had a PCN reaction causing severe rash involving mucus membranes or skin necrosis: No Has patient had a PCN reaction that required hospitalization: No Has patient had a PCN reaction occurring within the last 10 years: No If all of the above answers are "NO", then may proceed with Cephalosporin use.   . Bactrim [Sulfamethoxazole-Trimethoprim] Diarrhea and Nausea Only    Past Medical History:  Diagnosis Date  . Anemia   . Arthritis   . Bronchitis   . C. difficile diarrhea 09/26/2014  . Cataracts, both eyes   . Cellulitis of right foot 06/02/2014  . CKD (chronic kidney disease) stage 3, GFR 30-59 ml/min (HCC)    Dialysis T, TH, Sat  . Diabetic ulcer of right foot ([place]) 06/02/2014  . DVT (deep venous thrombosis) ([place]) 10/2014   one in each one in leg- PICC line , one in right and left arm  . H/O seasonal allergies   . Heart murmur    told once when she was pregnant- no furter mention- was in notes 11/2015- had Echo 8 /4/17  . History of blood transfusion   . Hypercholesteremia   . Hypertension   . Left foot infection   . Neuropathy    feet  . Neuropathy in diabetes ([place])   . Peripheral vascular disease ([place])   . Sleep apnea    not able to use CPAP  . Staphylococcus aureus bacteremia 10/2014  . Type 1 diabetes ([place])     onset age 49    Past Surgical History:  Procedure Laterality Date  . AMPUTATION TOE Left 07/24/2015   5th toe and Hallux amputation  . AV FISTULA PLACEMENT Left 08/06/2018   Procedure: INSERTION OF BASCILIC VEN TRANSPOSITION 1ST STAGE;  Surgeon: Rosetta Posner, MD;  Location: [facility];  Service: Vascular;  Laterality: Left;  . CESAREAN SECTION    . EXOSTECTECTOMY TOE Left 04/11/2016   Procedure: EXOSTECTECTOMY CUBOID AND 5TH METATARSAL AND PLACEMENT OF ANTIBIOTIC BEADS;  Surgeon: Edrick Kins, DPM;  Location: [facility];  Service: Podiatry;  Laterality: Left;  . EYE SURGERY Bilateral    [place]  . FEMORAL-POPLITEAL BYPASS GRAFT Left 05/03/2015  . FOOT AMPUTATION THROUGH METATARSAL Right 2016  . hemrrhoidectomy    . I&D EXTREMITY Right 06/10/2014   Procedure: IRRIGATION AND DEBRIDEMENT Right Foot;  Surgeon: Newt Minion, MD;  Location: [facility];  Service: Orthopedics;  Laterality: Right;  . I&D EXTREMITY Right 07/31/2018   Procedure: IRRIGATION AND DEBRIDEMENT EXTREMITY;  Surgeon: Edrick Kins, DPM;  Location: [facility];  Service: Podiatry;  Laterality: Right;  . I&D EXTREMITY Right 08/04/2018   Procedure: IRRIGATION AND DEBRIDEMENT FOOT with placement of wound vac;  Surgeon: Edrick Kins, DPM;  Location: [facility];  Service: Podiatry;  Laterality: Right;  . INCISION AND DRAINAGE Left 04/11/2016   Procedure: INCISION AND DRAINAGE A DEEP COMPLICATED WOUND OF LEFT FOOT;  Surgeon: Edrick Kins, DPM;  Location: [facility];  Service: Podiatry;  Laterality: Left;  . INSERTION OF DIALYSIS CATHETER Right 08/06/2018   Procedure: INSERTION OF TUNNELED DIALYSIS CATHETER;  Surgeon: Rosetta Posner, MD;  Location: [facility];  Service: Vascular;  Laterality: Right;  . IR FLUORO GUIDE CV LINE RIGHT  09/24/2017  . IR FLUORO GUIDE CV LINE RIGHT  08/01/2018  . IR REMOVAL TUN CV CATH W/O FL  11/22/2017  . IR US GUIDE VASC ACCESS RIGHT  09/24/2017  .  IR US GUIDE VASC ACCESS RIGHT  08/01/2018  . IRRIGATION AND DEBRIDEMENT ABSCESS Left 05/06/2017    Procedure: IRRIGATION AND DEBRIDEMENT ABSCESS LEFT FOOT;  Surgeon: Edrick Kins, DPM;  Location: [facility];  Service: Podiatry;  Laterality: Left;  . PERIPHERALLY INSERTED CENTRAL CATHETER INSERTION    . SKIN GRAFT Right 06/15/2014  . SKIN GRAFT Left 06/2015   foot   . SKIN SPLIT GRAFT Right 06/15/2014   Procedure: SPLIT THICKNESS SKIN GRAFT RIGHT FOOT;  Surgeon: Newt Minion, MD;  Location: [facility];  Service: Orthopedics;  Laterality: Right;  . TRANSMETATARSAL AMPUTATION Right   . TRANSMETATARSAL AMPUTATION Right 09/11/2014    Family History  Problem Relation Age of Onset  . Hyperlipidemia Mother   . Dementia Mother     Social History:  reports that she quit smoking about 10 years ago. She quit after 15.00 years of use. She has never used smokeless tobacco. She reports previous alcohol use. She reports that she does not use drugs.  Review of Systems:   Hypertension/CKD:     Hypertension  Treated with amlodipine 5 mg, followed by nephrologist  BP Readings from Last 3 Encounters:  12/03/18 110/70  11/10/18 123/75  10/20/18 (!) 160/73     She has history of vitamin D deficiency, managed by nephrologist, no recent labs available for calcium   Lab Results  Component Value Date   CALCIUM 6.7 (L) 08/08/2018   PHOS 4.6 08/08/2018     Lipids: Has been  treated with pravastatin 40 mg  Triglycerides are high on the last measurement LDL as follows   Lab Results  Component Value Date   CHOL 152 06/12/2018   HDL 24.40 (L) 06/12/2018   LDLCALC 78 05/02/2016   LDLDIRECT 82.0 06/12/2018   TRIG 222.0 (H) 06/12/2018   CHOLHDL 6 06/12/2018      Chronic anemia, followed by nephrologist and PCP:  Lab Results  Component Value Date   WBC 6.1 11/10/2018   HGB 9.3 (L) 11/10/2018   HCT 31.2 (L) 11/10/2018   MCV 73.9 (L) 11/10/2018   PLT 370 11/10/2018    She has a Charcot foot, has had amputation   Currently getting hyperbaric treatment for osteomyelitis of the right foot  and has been recommended below-knee amputation which he is refusing     Examination:   BP 110/70 (BP Location: Left Arm, Patient Position: Sitting, Cuff Size: Normal)   Pulse 88   Ht 5\' 7"  (1.702 m)   Wt 119 lb 6.4 oz (54.2 kg)   SpO2 97%   BMI 18.70 kg/m   Body mass index is 18.7 kg/m.      ASSESSMENT/ PLAN:   Diabetes type 1 with poor control and multiple complications      See history of present illness for detailed discussion of current diabetes management, blood sugar patterns and problems identified  A1c was last 11.4  Her blood sugars are averaging 350 over the last 4 weeks  Her diabetes is persistently poorly controlled related to inadequate basal and bolus insulin She is afraid of hypoglycemia and does not get enough basal insulin since she has unexpectedly low readings at times Also she is not always bolusing adequately Some of her diet is high in carbohydrate counting high readings after meals and she will not be always bolusing with every meal and snack Also she thinks that her blood sugars are higher when she tries to enter the correct number of carbohydrates  With current monitoring no consistent  hyperglycemic pattern is present and basal rate changes cannot be made She is not allowed to use her pump while she is in the hyperbaric chamber and she has discontinued her pump for as much as 4 hours for this Also recently has started suspending her pump overnight which causes blood sugars to be over 600 the next morning Also because of her variability difficult to assess her correction factor settings  She is still waiting to get the T-Slim pump but not clear if she is eligible from her insurance to get this covered Again reminded her of the benefits of the closed-loop system and how the Dexcom would be integrated with the T-Slim pump if she can get this  Still not able to program her pump settings and these were done in the office  Diabetic foot ulcer:  Continues to have osteomyelitis    PLAN:     She was advised not to suspend her pump who overnight and only for an hour or 2 until her blood sugar starts coming up  She needs to resume her pump as soon as her hyperbaric treatment is finished in the mornings  Carbohydrate coverage 1: 15  To make sure she boluses for all meals with carbohydrates and snacks  If she has lower readings unexpectedly she can increase her carbohydrate intake rather than discontinue her pump  She will call to see if she can get insurance coverage for her T-Slim pump  Follow-up in 2 months  There are no Patient Instructions on file for this visit.   Counseling time on subjects discussed in assessment and plan sections is over 50% of today's 25 minute visit   Elayne Snare 12/03/2018, 10:24 AM      Elayne Snare 12/03/18

## 2018-12-04 DIAGNOSIS — E1069 Type 1 diabetes mellitus with other specified complication: Secondary | ICD-10-CM | POA: Diagnosis not present

## 2018-12-04 LAB — GLUCOSE, CAPILLARY
Glucose-Capillary: 132 mg/dL — ABNORMAL HIGH (ref 70–99)
Glucose-Capillary: 261 mg/dL — ABNORMAL HIGH (ref 70–99)

## 2018-12-05 DIAGNOSIS — E1069 Type 1 diabetes mellitus with other specified complication: Secondary | ICD-10-CM | POA: Diagnosis not present

## 2018-12-05 LAB — GLUCOSE, CAPILLARY
Glucose-Capillary: 261 mg/dL — ABNORMAL HIGH (ref 70–99)
Glucose-Capillary: 325 mg/dL — ABNORMAL HIGH (ref 70–99)

## 2018-12-10 DIAGNOSIS — E1069 Type 1 diabetes mellitus with other specified complication: Secondary | ICD-10-CM | POA: Diagnosis not present

## 2018-12-10 LAB — GLUCOSE, CAPILLARY
Glucose-Capillary: 217 mg/dL — ABNORMAL HIGH (ref 70–99)
Glucose-Capillary: 227 mg/dL — ABNORMAL HIGH (ref 70–99)

## 2018-12-11 DIAGNOSIS — E1069 Type 1 diabetes mellitus with other specified complication: Secondary | ICD-10-CM | POA: Diagnosis not present

## 2018-12-11 LAB — GLUCOSE, CAPILLARY
Glucose-Capillary: 269 mg/dL — ABNORMAL HIGH (ref 70–99)
Glucose-Capillary: 281 mg/dL — ABNORMAL HIGH (ref 70–99)

## 2018-12-12 DIAGNOSIS — E1069 Type 1 diabetes mellitus with other specified complication: Secondary | ICD-10-CM | POA: Diagnosis not present

## 2018-12-12 LAB — GLUCOSE, CAPILLARY
Glucose-Capillary: 197 mg/dL — ABNORMAL HIGH (ref 70–99)
Glucose-Capillary: 289 mg/dL — ABNORMAL HIGH (ref 70–99)

## 2018-12-15 DIAGNOSIS — E1069 Type 1 diabetes mellitus with other specified complication: Secondary | ICD-10-CM | POA: Diagnosis not present

## 2018-12-15 LAB — GLUCOSE, CAPILLARY
Glucose-Capillary: >600 mg/dL (ref 70–99)
Glucose-Capillary: >600 mg/dL (ref 70–99)

## 2018-12-16 DIAGNOSIS — E1069 Type 1 diabetes mellitus with other specified complication: Secondary | ICD-10-CM | POA: Diagnosis not present

## 2018-12-16 LAB — GLUCOSE, CAPILLARY
Glucose-Capillary: 244 mg/dL — ABNORMAL HIGH (ref 70–99)
Glucose-Capillary: 310 mg/dL — ABNORMAL HIGH (ref 70–99)

## 2018-12-17 ENCOUNTER — Other Ambulatory Visit: Payer: Self-pay | Admitting: Endocrinology

## 2018-12-17 DIAGNOSIS — E1069 Type 1 diabetes mellitus with other specified complication: Secondary | ICD-10-CM | POA: Diagnosis not present

## 2018-12-17 LAB — GLUCOSE, CAPILLARY
Glucose-Capillary: 204 mg/dL — ABNORMAL HIGH (ref 70–99)
Glucose-Capillary: 172 mg/dL — ABNORMAL HIGH (ref 70–99)

## 2018-12-18 DIAGNOSIS — E1069 Type 1 diabetes mellitus with other specified complication: Secondary | ICD-10-CM | POA: Diagnosis not present

## 2018-12-18 LAB — GLUCOSE, CAPILLARY
Glucose-Capillary: 245 mg/dL — ABNORMAL HIGH (ref 70–99)
Glucose-Capillary: 392 mg/dL — ABNORMAL HIGH (ref 70–99)

## 2018-12-19 ENCOUNTER — Other Ambulatory Visit: Payer: Self-pay | Admitting: Physician Assistant

## 2018-12-19 DIAGNOSIS — E1069 Type 1 diabetes mellitus with other specified complication: Secondary | ICD-10-CM | POA: Diagnosis not present

## 2018-12-19 DIAGNOSIS — Z1231 Encounter for screening mammogram for malignant neoplasm of breast: Secondary | ICD-10-CM

## 2018-12-19 LAB — GLUCOSE, CAPILLARY
Glucose-Capillary: 500 mg/dL — ABNORMAL HIGH (ref 70–99)
Glucose-Capillary: 347 mg/dL — ABNORMAL HIGH (ref 70–99)

## 2018-12-23 ENCOUNTER — Encounter: Payer: Self-pay | Admitting: Gynecology

## 2018-12-23 DIAGNOSIS — E1069 Type 1 diabetes mellitus with other specified complication: Secondary | ICD-10-CM | POA: Diagnosis not present

## 2018-12-23 LAB — GLUCOSE, CAPILLARY
Glucose-Capillary: 195 mg/dL — ABNORMAL HIGH (ref 70–99)
Glucose-Capillary: 457 mg/dL — ABNORMAL HIGH (ref 70–99)

## 2018-12-24 DIAGNOSIS — E1069 Type 1 diabetes mellitus with other specified complication: Secondary | ICD-10-CM | POA: Diagnosis not present

## 2018-12-24 LAB — GLUCOSE, CAPILLARY
Glucose-Capillary: 298 mg/dL — ABNORMAL HIGH (ref 70–99)
Glucose-Capillary: 307 mg/dL — ABNORMAL HIGH (ref 70–99)

## 2018-12-25 ENCOUNTER — Other Ambulatory Visit (HOSPITAL_COMMUNITY)
Admission: RE | Admit: 2018-12-25 | Discharge: 2018-12-25 | Disposition: A | Discharge status: Home / Self Care | Payer: Medicare HMO | Source: Other Acute Inpatient Hospital | Attending: Internal Medicine | Admitting: Internal Medicine

## 2018-12-25 DIAGNOSIS — E1069 Type 1 diabetes mellitus with other specified complication: Secondary | ICD-10-CM | POA: Diagnosis not present

## 2018-12-25 DIAGNOSIS — M65061 Abscess of tendon sheath, right lower leg: Secondary | ICD-10-CM | POA: Insufficient documentation

## 2018-12-25 LAB — GLUCOSE, CAPILLARY
Glucose-Capillary: 137 mg/dL — ABNORMAL HIGH (ref 70–99)
Glucose-Capillary: 330 mg/dL — ABNORMAL HIGH (ref 70–99)

## 2018-12-26 DIAGNOSIS — E1069 Type 1 diabetes mellitus with other specified complication: Secondary | ICD-10-CM | POA: Diagnosis not present

## 2018-12-26 LAB — GLUCOSE, CAPILLARY
Glucose-Capillary: >600 mg/dL (ref 70–99)
Glucose-Capillary: 539 mg/dL (ref 70–99)

## 2018-12-28 LAB — AEROBIC CULTURE W GRAM STAIN (SUPERFICIAL SPECIMEN)

## 2018-12-29 DIAGNOSIS — E1069 Type 1 diabetes mellitus with other specified complication: Secondary | ICD-10-CM | POA: Diagnosis not present

## 2018-12-29 LAB — GLUCOSE, CAPILLARY
Glucose-Capillary: 252 mg/dL — ABNORMAL HIGH (ref 70–99)
Glucose-Capillary: 291 mg/dL — ABNORMAL HIGH (ref 70–99)

## 2018-12-30 DIAGNOSIS — E1069 Type 1 diabetes mellitus with other specified complication: Secondary | ICD-10-CM | POA: Diagnosis not present

## 2018-12-30 LAB — GLUCOSE, CAPILLARY
Glucose-Capillary: 135 mg/dL — ABNORMAL HIGH (ref 70–99)
Glucose-Capillary: 213 mg/dL — ABNORMAL HIGH (ref 70–99)

## 2018-12-31 DIAGNOSIS — E1069 Type 1 diabetes mellitus with other specified complication: Secondary | ICD-10-CM | POA: Diagnosis not present

## 2018-12-31 LAB — GLUCOSE, CAPILLARY
Glucose-Capillary: 232 mg/dL — ABNORMAL HIGH (ref 70–99)
Glucose-Capillary: 413 mg/dL — ABNORMAL HIGH (ref 70–99)

## 2019-01-01 ENCOUNTER — Encounter (HOSPITAL_BASED_OUTPATIENT_CLINIC_OR_DEPARTMENT_OTHER): Payer: Medicare HMO | Attending: Internal Medicine | Admitting: Internal Medicine

## 2019-01-01 DIAGNOSIS — L97513 Non-pressure chronic ulcer of other part of right foot with necrosis of muscle: Secondary | ICD-10-CM | POA: Diagnosis not present

## 2019-01-01 DIAGNOSIS — Z86718 Personal history of other venous thrombosis and embolism: Secondary | ICD-10-CM | POA: Diagnosis not present

## 2019-01-01 DIAGNOSIS — E1040 Type 1 diabetes mellitus with diabetic neuropathy, unspecified: Secondary | ICD-10-CM | POA: Diagnosis not present

## 2019-01-01 DIAGNOSIS — L97312 Non-pressure chronic ulcer of right ankle with fat layer exposed: Secondary | ICD-10-CM | POA: Diagnosis not present

## 2019-01-01 DIAGNOSIS — G4730 Sleep apnea, unspecified: Secondary | ICD-10-CM | POA: Insufficient documentation

## 2019-01-01 DIAGNOSIS — B9562 Methicillin resistant Staphylococcus aureus infection as the cause of diseases classified elsewhere: Secondary | ICD-10-CM | POA: Diagnosis not present

## 2019-01-01 DIAGNOSIS — I10 Essential (primary) hypertension: Secondary | ICD-10-CM | POA: Diagnosis not present

## 2019-01-01 DIAGNOSIS — E10621 Type 1 diabetes mellitus with foot ulcer: Secondary | ICD-10-CM | POA: Insufficient documentation

## 2019-01-01 DIAGNOSIS — Z992 Dependence on renal dialysis: Secondary | ICD-10-CM | POA: Diagnosis not present

## 2019-01-01 DIAGNOSIS — E10622 Type 1 diabetes mellitus with other skin ulcer: Secondary | ICD-10-CM | POA: Diagnosis not present

## 2019-01-01 DIAGNOSIS — L02415 Cutaneous abscess of right lower limb: Secondary | ICD-10-CM | POA: Diagnosis not present

## 2019-01-01 LAB — GLUCOSE, CAPILLARY
Glucose-Capillary: 100 mg/dL — ABNORMAL HIGH (ref 70–99)
Glucose-Capillary: 125 mg/dL — ABNORMAL HIGH (ref 70–99)
Glucose-Capillary: 276 mg/dL — ABNORMAL HIGH (ref 70–99)

## 2019-01-01 IMAGING — DX DG FOOT COMPLETE 3+V*R*
3 series · 3 of 3 positions shown · non-contrast
Comparison: Right foot series of February 11, 2017

CLINICAL DATA: Right foot pain. Previous amputation at the level of
the proximal third of the metatarsals.

EXAM:
RIGHT FOOT COMPLETE - 3+ VIEW

[foot ap]
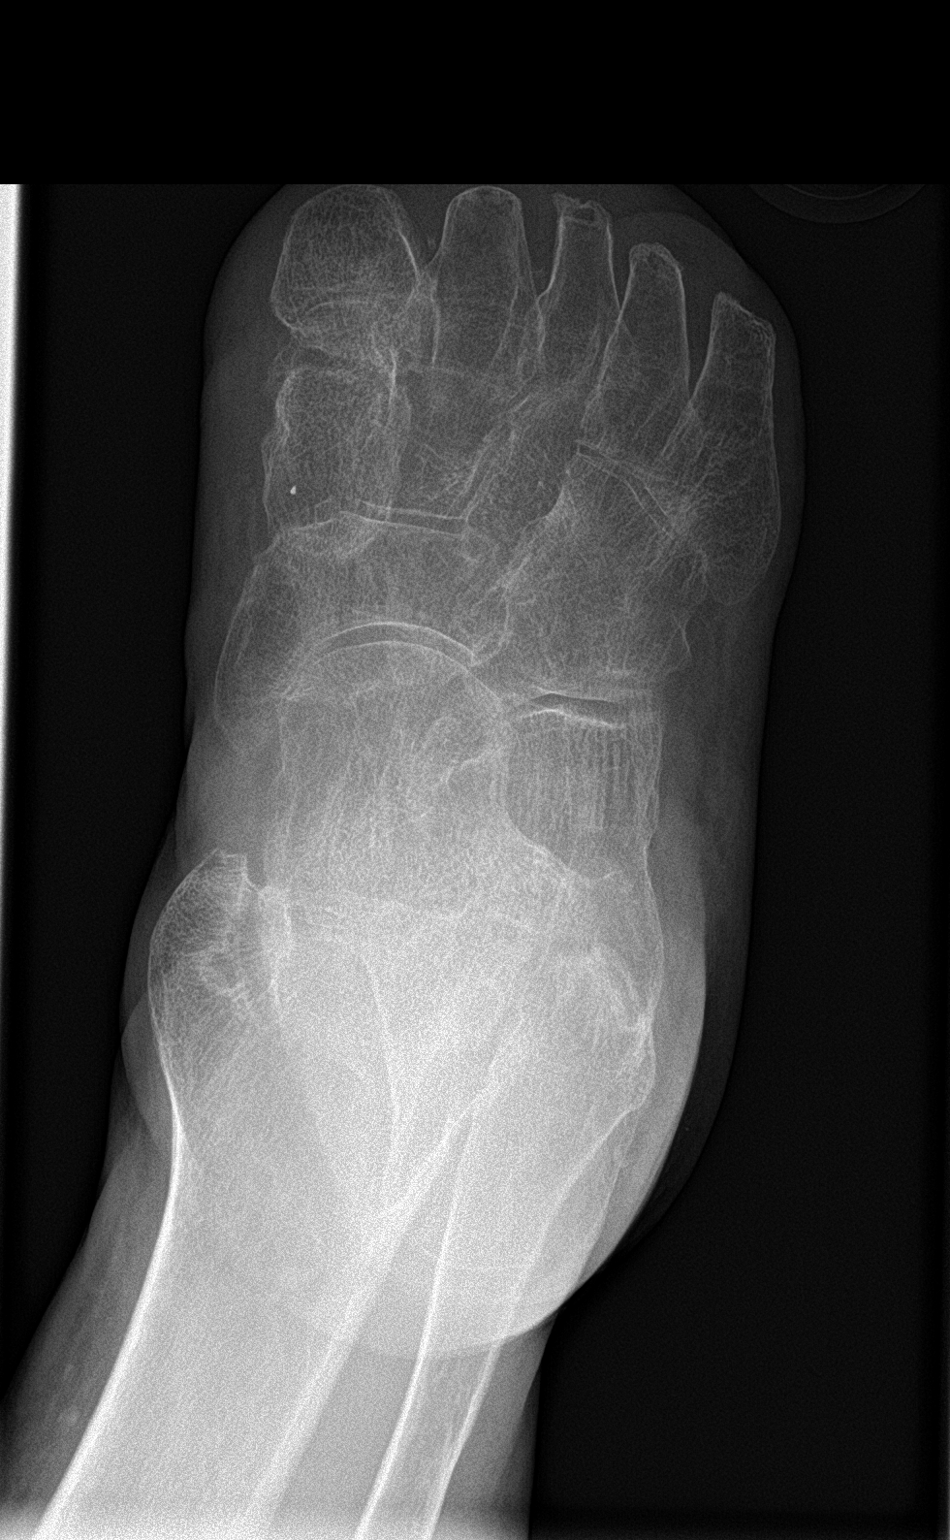

[foot obl]
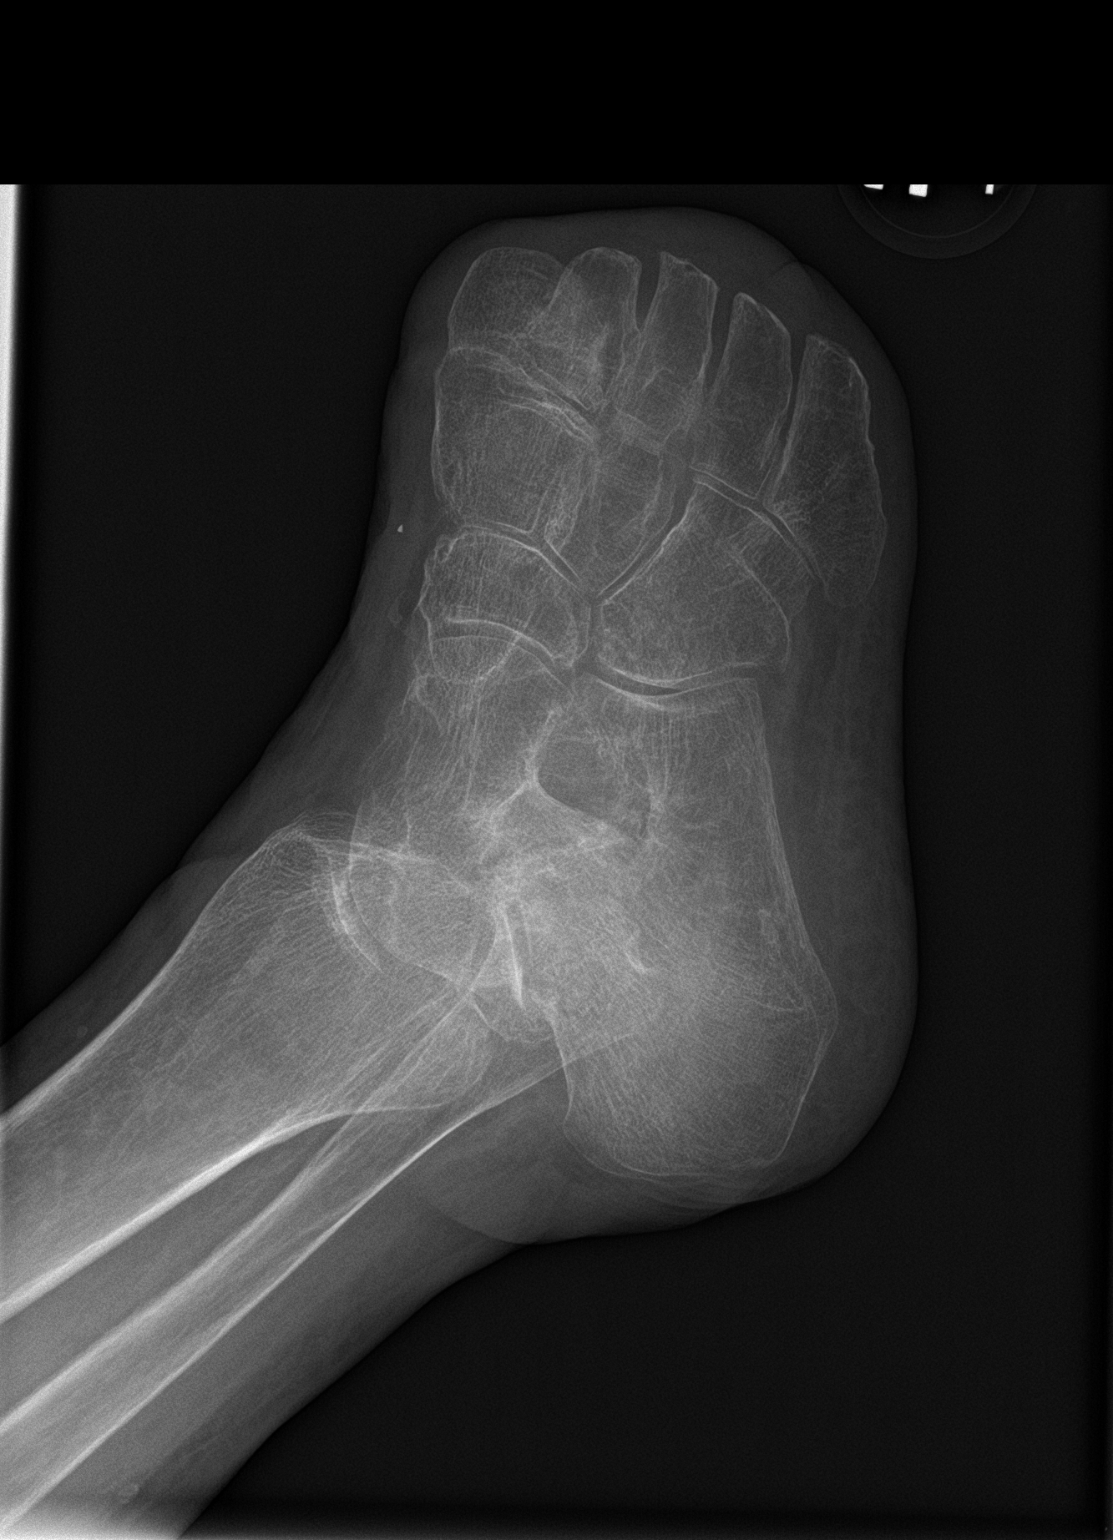

[foot lat]
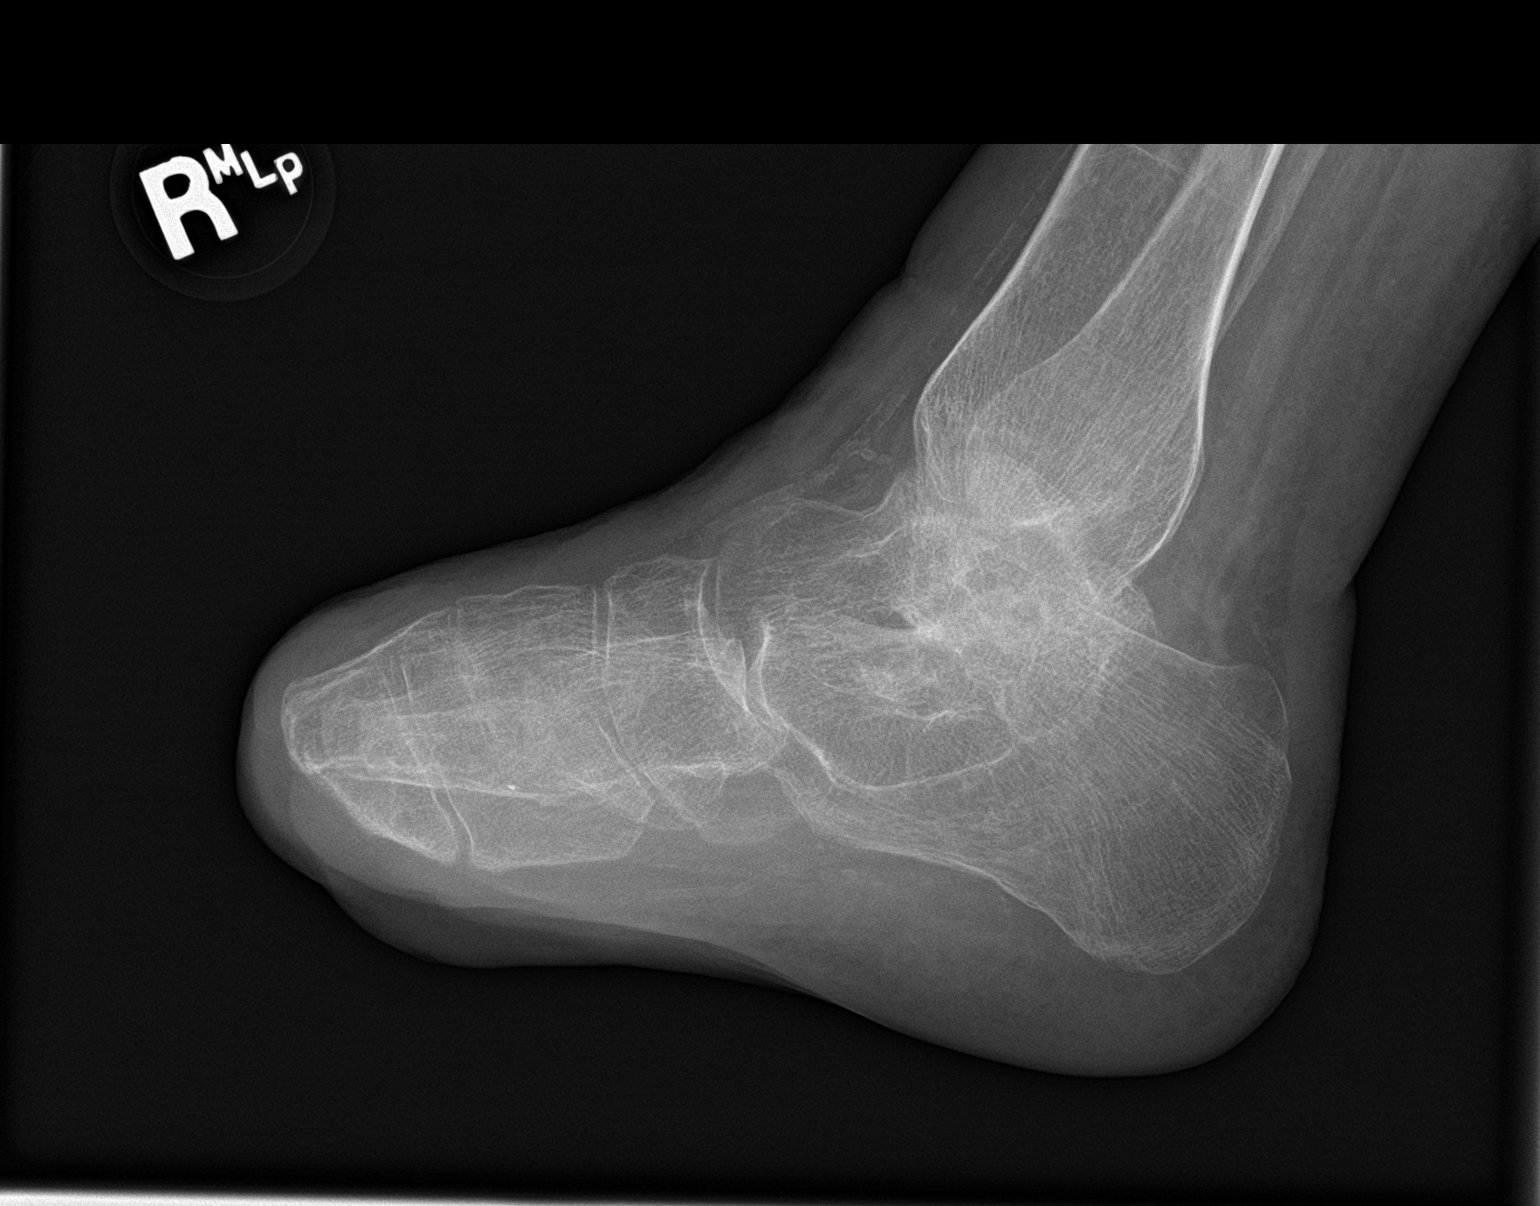

[3 of 3 positions shown; findings below may reference images not displayed]

FINDINGS: The bones are diffusely osteopenic. The tips of the metatarsal
remnants appear sharp. There is soft tissue swelling centered over
the third and fourth metatarsal remnants. There may be a cutaneous
ulcer here. There is lucency in the soft tissues along the lateral
aspect of the hindfoot.
IMPRESSION: Probable cellulitis in the anterior soft tissues over the stumps of
the third and fourth metatarsals. Possible cellulitis along the
lateral aspect of the hindfoot. No objective evidence of acute
osteomyelitis.

## 2019-01-01 NOTE — Progress Notes (Addendum)
[name], [name] ([id number]) Visit Report for 01/01/2019 SuperBill Details Patient Name: Date of Service: [name], [name] 01/01/2019 Medical Record Number:[id number] Patient Account Number: [id number] Date of Birth/Sex: Treating RN: 05-24-1970 (48 y.o. F) Primary Care Provider: [name] Other Clinician: [name] Referring Provider: Treating Provider/Extender:Aundrea Higginbotham, [name], [name] in Treatment: 17 Diagnosis Coding ICD-10 Codes Code Description [phone] Other chronic osteomyelitis, right ankle and foot L97.514 Non-pressure chronic ulcer of other part of right foot with necrosis of bone E10.621 Type 1 diabetes mellitus with foot ulcer L02.415 Cutaneous abscess of right lower limb Facility Procedures CPT4 Code Description Modifier Quantity [id number] G0277-(Facility Use Only) HBOT, full body chamber, 91min 4 Physician Procedures CPT4 Code Description Modifier Quantity [id number] 37106 - WC PHYS HYPERBARIC OXYGEN THERAPY 1 ICD-10 Diagnosis Description E10.621 Type 1 diabetes mellitus with foot ulcer Electronic Signature(s) Signed: 01/02/2019 10:38:44 AM By: [name] EMT/HBOT Signed: 01/02/2019 6:18:27 PM By: Linton Ham MD Previous Signature: 01/02/2019 10:19:49 AM Version By: [name] EMT/HBOT Previous Signature: 01/01/2019 4:32:14 PM Version By: [name] EMT/HBOT Entered By: [name] on 01/02/2019 10:25:16

## 2019-01-01 NOTE — Progress Notes (Addendum)
[name], Nancy L. ([id number]) Visit Report for 01/01/2019 Arrival Information Details Patient Name: Date of Service: [name], [name] 01/01/2019 1:00 PM Medical Record Number:[id number] Patient Account Number: [id number] Date of Birth/Sex: Treating RN: 01-Sep-1970 (48 y.o. F) Primary Care Sander Speckman: None, Designated Other Clinician: [name] Referring Ryah Cribb: Treating Kenza Munar/Extender:[name], Luvenia Starch, Barrington Ellison in Treatment: 68 Visit Information History Since Last Visit Added or deleted any medications: No Patient Arrived: Wheel Chair Any new allergies or adverse reactions: No Arrival Time: 13:05 Had a fall or experienced change in No activities of daily living that may affect Accompanied By: self risk of falls: Transfer Assistance: None Signs or symptoms of abuse/neglect since last No Patient Identification Verified: Yes visito Secondary Verification Process Completed: Yes Hospitalized since last visit: No Patient Requires Transmission-Based No Implantable device outside of the clinic excluding No Precautions: cellular tissue based products placed in the center Patient Has Alerts: No since last visit: Pain Present Now: No Electronic Signature(s) Signed: 01/02/2019 10:38:44 AM By: [name] EMT/HBOT Previous Signature: 01/01/2019 4:32:14 PM Version By: [name] EMT/HBOT Entered By: [name] on 01/02/2019 10:24:36 -------------------------------------------------------------------------------- Encounter Discharge Information Details Patient Name: Date of Service: [name]. 01/01/2019 1:00 PM Medical Record Number:[id number] Patient Account Number: [id number] Date of Birth/Sex: Treating RN: March 24, 1971 (48 y.o. F) Primary Care Madilyn Cephas: None, Designated Other Clinician: [name] Referring Darnisha Vernet: Treating Regis Wiland/Extender:[name], Luvenia Starch, Barrington Ellison in Treatment: 17 Encounter Discharge Information  Items Discharge Condition: Stable Ambulatory Status: Wheelchair Discharge Destination: Home Transportation: Private Auto Accompanied By: self Schedule Follow-up Appointment: Yes Clinical Summary of Care: Patient Declined Electronic Signature(s) Signed: 01/02/2019 10:38:44 AM By: [name] EMT/HBOT Previous Signature: 01/01/2019 4:32:14 PM Version By: [name] EMT/HBOT Entered By: [name] on 01/02/2019 10:25:39 -------------------------------------------------------------------------------- Patient/Caregiver Education Details Patient Name: Date of Service: [name] 10/1/2020andnbsp1:00 PM Medical Record Number:[id number] Patient Account Number: [id number] Date of Birth/Gender: Treating RN: 1970/05/09 (48 y.o. F) Primary Care Physician: None, Designated Other Clinician: [name] Referring Physician: Treating Physician/Extender:[name], Luvenia Starch, Barrington Ellison in Treatment: 17 Education Assessment Education Provided To: Patient Education Topics Provided Hyperbaric Oxygenation: Methods: Explain/Verbal Responses: State content correctly Electronic Signature(s) Signed: 01/02/2019 10:38:44 AM By: [name] EMT/HBOT Previous Signature: 01/01/2019 4:32:14 PM Version By: [name] EMT/HBOT Entered By: [name] on 01/02/2019 10:25:30 -------------------------------------------------------------------------------- Vitals Details Patient Name: Date of Service: [name]. 01/01/2019 1:00 PM Medical Record Number:[id number] Patient Account Number: [id number] Date of Birth/Sex: Treating RN: 1970-04-07 (48 y.o. F) Primary Care Dyani Babel: None, Designated Other Clinician: [name] Referring Musab Wingard: Treating Catori Panozzo/Extender:[name], Luvenia Starch, Barrington Ellison in Treatment: 17 Vital Signs Time Taken: 13:10 Temperature (F): 97.8 Height (in): 67 Pulse (bpm): 105 Weight (lbs): 125 Respiratory Rate (breaths/min): 17 Body  Mass Index (BMI): 19.6 Blood Pressure (mmHg): 115/76 Capillary Blood Glucose (mg/dl): 130 Reference Range: 80 - 120 mg / dl Electronic Signature(s) Signed: 01/02/2019 10:38:44 AM By: [name] EMT/HBOT Previous Signature: 01/01/2019 4:32:14 PM Version By: [name] EMT/HBOT Entered By: [name] on 01/02/2019 10:24:47

## 2019-01-01 NOTE — Progress Notes (Addendum)
CELENIA, HRUSKA ([id number]) Visit Report for 01/01/2019 HBO Details Patient Name: Date of Service: Nancy, [name] 01/01/2019 1:00 PM Medical Record Number:[id number] Patient Account Number: [id number] Date of Birth/Sex: Treating RN: [birth year] (48 y.o. F) Primary Care Shermika Balthaser: [name] Other Clinician: [name] Referring Demontre Padin: Treating Karlita Lichtman/Extender:[name], [name], [name] in Treatment: 17 HBO Treatment Course Details Treatment Course Number: 4 Ordering Eithan Beagle: Linton Ham Total Treatments Ordered: 50 HBO Treatment Start Date: 09/30/2018 HBO Indication: Diabetic Ulcer(s) of the Lower Extremity HBO Treatment Details Treatment Number: 58 Patient Type: Outpatient Chamber Type: Monoplace Chamber Serial #: [id number] Treatment Protocol: 2.5 ATA with 90 minutes oxygen, with two 5 minute air breaks Treatment Details Compression Rate Down: 2.0 psi / minute De-Compression Rate Up: 2.0 psi / minute Air breaks and CompressTx Pressure breathing periods DecompressDecompress Begins Reached (leave unused spaces Begins Ends blank) Chamber Pressure (ATA)1 2.5 2.5 2.5 2.5 2.5 --2.5 1 Clock Time (24 hr) 13:21 13:33 [id number]:1751:[id number]:5277:82--42:35 15:25 Treatment Length: 124 (minutes) Treatment Segments: 4 Vital Signs Capillary Blood Glucose Reference Range: 80 - 120 mg / dl HBO Diabetic Blood Glucose Intervention Range: <131 mg/dl or >249 mg/dl Time Vitals Blood Respiratory Capillary Blood Glucose Pulse Action Type: Pulse: Temperature: Taken: Pressure: Rate: Glucose (mg/dl): Meter #: Oximetry (%) Taken: Pre 13:10 115/76 105 17 97.8 130 Post 15:30 174/89 92 18 98.3 276 Treatment Response Treatment Toleration: Well Treatment Completion Treatment Completed without Adverse Event Status: Additional Procedure Documentation Tissue Sevierity: Necrosis of bone Linsey Arteaga Notes No concerns with treatment given Physician HBO Attestation: I certify  that I supervised this HBO treatment in accordance with Medicare guidelines. A trained Yes emergency response team is readily available per hospital policies and procedures. Continue HBOT as ordered. Yes Electronic Signature(s) Signed: 01/02/2019 10:38:44 AM By: [name] EMT/HBOT Signed: 01/02/2019 6:18:27 PM By: Linton Ham MD Previous Signature: 01/01/2019 4:32:14 PM Version By: [name] EMT/HBOT Entered By: [name] on 01/02/2019 10:25:06 -------------------------------------------------------------------------------- HBO Safety Checklist Details Patient Name: Date of Service: Nancy, [name] 01/01/2019 1:00 PM Medical Record Number:[id number] Patient Account Number: [id number] Date of Birth/Sex: Treating RN: April 12, 1970 (48 y.o. F) Primary Care Liviah Cake: [name] Other Clinician: [name] Referring Alzena Gerber: Treating Leyah Bocchino/Extender:[name], [name], [name] in Treatment: 17 HBO Safety Checklist Items Safety Checklist Consent Form Signed Patient voided / foley secured and emptied When did you last eato 1200 - cereal Last dose of injectable or oral agent insulin pump NA Ostomy pouch emptied and vented if applicable NA All implantable devices assessed, documented and approved NA Intravenous access site secured and place Valuables secured Linens and cotton and cotton/polyester blend (less than 51% polyester) Personal oil-based products / skin lotions / body lotions removed NA Wigs or hairpieces removed NA Smoking or tobacco materials removed Books / newspapers / magazines / loose paper removed Cologne, aftershave, perfume and deodorant removed Jewelry removed (may wrap wedding band) Make-up removed Hair care products removed Battery operated devices (external) removed NA Heating patches and chemical warmers removed NA Titanium eyewear removed NA Nail polish cured greater than 10 hours NA Casting material cured greater than  10 hours NA Hearing aids removed NA Loose dentures or partials removed NA Prosthetics have been removed Patient demonstrates correct use of air break device (if applicable) Patient concerns have been addressed Patient grounding bracelet on and cord attached to chamber Specifics for Inpatients (complete in addition to above) Medication sheet sent with patient Intravenous medications needed or due during therapy sent with patient Drainage tubes (e.g. nasogastric tube  or chest tube secured and vented) Endotracheal or Tracheotomy tube secured Cuff deflated of air and inflated with saline Airway suctioned Electronic Signature(s) Signed: 01/02/2019 10:38:44 AM By: [name] EMT/HBOT Previous Signature: 01/01/2019 4:26:08 PM Version By: [name] EMT/HBOT Entered By: [name] on 01/02/2019 10:24:58

## 2019-01-01 NOTE — Progress Notes (Signed)
[name], Nancy L. ([id number]) Visit Report for 12/31/2018 Arrival Information Details Patient Name: Date of Service: [name], [name] 12/31/2018 1:00 PM Medical Record Number:[id number] Patient Account Number: [id number] Date of Birth/Sex: Treating RN: 1970-12-04 (48 y.o. [name], [name] Primary Care Naiyana Barbian: None, Designated Other Clinician: [name] Referring Zoey Gilkeson: Treating Phyllis Abelson/Extender:Stone III, Lily Kocher, Barrington Ellison in Treatment: 68 Visit Information History Since Last Visit Added or deleted any medications: No Patient Arrived: Wheel Chair Any new allergies or adverse reactions: No Arrival Time: 13:35 Had a fall or experienced change in No activities of daily living that may affect Accompanied By: self risk of falls: Transfer Assistance: None Signs or symptoms of abuse/neglect since last No Patient Identification Verified: Yes visito Secondary Verification Process Completed: Yes Hospitalized since last visit: No Patient Requires Transmission-Based No Implantable device outside of the clinic excluding No Precautions: cellular tissue based products placed in the center Patient Has Alerts: No since last visit: Pain Present Now: No Electronic Signature(s) Signed: 01/01/2019 3:21:35 PM By: [name] EMT/HBOT Entered By: [name] on 12/31/2018 14:28:17 -------------------------------------------------------------------------------- Encounter Discharge Information Details Patient Name: Date of Service: [name]. 12/31/2018 1:00 PM Medical Record Number:[id number] Patient Account Number: [id number] Date of Birth/Sex: Treating RN: [birth year] (48 y.o. [name] Primary Care Arpita Fentress: None, Designated Other Clinician: [name] Referring Tao Satz: Treating Ardella Chhim/Extender:Stone III, Lily Kocher, Barrington Ellison in Treatment: 17 Encounter Discharge Information Items Discharge Condition: Stable Ambulatory Status:  Wheelchair Discharge Destination: Home Transportation: Private Auto Accompanied By: self Schedule Follow-up Appointment: Yes Clinical Summary of Care: Patient Declined Electronic Signature(s) Signed: 01/01/2019 3:21:35 PM By: [name] EMT/HBOT Entered By: [name] on 12/31/2018 16:09:19 -------------------------------------------------------------------------------- Patient/Caregiver Education Details Patient Name: Date of Service: [name], Nancy L. 9/30/2020andnbsp1:00 PM Medical Record Number:[id number] Patient Account Number: [id number] Date of Birth/Gender: Treating RN: 1971/02/22 (48 y.o. [name] Primary Care Physician: None, Designated Other Clinician: [name] Referring Physician: Treating Physician/Extender:Stone III, Lily Kocher, Barrington Ellison in Treatment: 17 Education Assessment Education Provided To: Patient Education Topics Provided Hyperbaric Oxygenation: Methods: Explain/Verbal Responses: State content correctly Electronic Signature(s) Signed: 01/01/2019 3:21:35 PM By: [name] EMT/HBOT Entered By: [name] on 12/31/2018 16:09:07 -------------------------------------------------------------------------------- Vitals Details Patient Name: Date of Service: [name]. 12/31/2018 1:00 PM Medical Record Number:[id number] Patient Account Number: [id number] Date of Birth/Sex: Treating RN: 11-Jun-1970 (48 y.o. [name] Primary Care Fran Neiswonger: None, Designated Other Clinician: [name] Referring [name]: Treating Joshoa Shawler/Extender:Stone III, Lily Kocher, Barrington Ellison in Treatment: 17 Vital Signs Time Taken: 13:40 Temperature (F): 98.6 Height (in): 67 Pulse (bpm): 106 Weight (lbs): 125 Respiratory Rate (breaths/min): 18 Body Mass Index (BMI): 19.6 Blood Pressure (mmHg): 128/69 Capillary Blood Glucose (mg/dl): 232 Reference Range: 80 - 120 mg / dl Electronic Signature(s) Signed: 01/01/2019 3:21:35  PM By: [name] EMT/HBOT Entered By: [name] on 12/31/2018 14:28:33

## 2019-01-02 ENCOUNTER — Encounter (HOSPITAL_BASED_OUTPATIENT_CLINIC_OR_DEPARTMENT_OTHER): Payer: Medicare HMO | Admitting: Internal Medicine

## 2019-01-02 ENCOUNTER — Other Ambulatory Visit: Payer: Self-pay

## 2019-01-02 DIAGNOSIS — E10621 Type 1 diabetes mellitus with foot ulcer: Secondary | ICD-10-CM | POA: Diagnosis not present

## 2019-01-02 NOTE — Progress Notes (Addendum)
ESTEPHANIA, LICCIARDI ([id number]) Visit Report for 01/02/2019 HPI Details Patient Name: Date of Service: CHAYLA, SHANDS 01/02/2019 12:30 PM Medical Record Number:[id number] Patient Account Number: [id number] Date of Birth/Sex: Treating RN: February 02, 1971 (48 y.o. [name] Primary Care Provider: [name] Other Clinician: Referring Provider: Treating Provider/Extender:[name], [name], [name] in Treatment: 17 History of Present Illness HPI Description: 48 year old diabetic who is known to have type 1 diabetes which is poorly controlled last hemoglobin A1c was 11%. She comes in with a ulcerated area on the left lateral foot which has been there for over 6 months. Was recently she has been treated by [name] of podiatry who saw her last on 05/28/2016. Review of his notes revealed that the patient had incision and drainage with placement of antibiotic beads to the left foot on 04/11/2016 for possible osteomyelitis of the cuboid bone. Over the last year she's had a history of amputation of the left fifth toe and a femoropopliteal popliteal bypass graft somewhere in April 2017. 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 His note [name] mentions that the patient has been referred to me for further wound care and possibly great candidate for hyperbaric oxygen therapy due to recurrent osteomyelitis. However we do not have any x-rays of biopsy reports confirming this. He has been on several antibiotics including Bactrim and most recently is on doxycycline for an MRSA. I understand, the patient was not a candidate for IV antibiotics as she has had previous PICC lines which resulted in blood clots in both arms. There was a x-ray report dated 04/04/2016 on [name] notes which showed evidence of fifth ray resection left foot with osteolytic changes noted to the fourth metatarsal and cuboid bone on the left. 06/13/2016 -- had a left foot x-ray which showed  no acute fracture or dislocation and no definite radiographic evidence of osteomyelitis. Advanced osteopenia was seen. 06/20/2016 -- she has noticed a new wound on the right plantar foot in the region where she had a callus before. 06/27/16- the patient did have her x-ray of the right foot which showed no findings to suggest osteomyelitis. She saw her endocrinologist, [name], yesterday. Her A1c in January was 11. He also indicates mismanagement and noncompliance regarding her diabetes. She is currently on Bactrim for a lip infection. She is complaining of nausea, vomiting and diarrhea. She is unable to articulate the exact orders or dosing of the Bactrim; it is unclear when she will complete this. 07/04/2016 -- results from [facility] of ABIs with ankle waveforms were noted from 02/14/2016. The examination done on 06/27/2015 showed noncompressible ABIs with the right being 1.45 and the left being 1.33. The present examination showed a right ABI of 1.19 on the left of 1.33. The conclusion was that right normal ABI in the lower extremity at rest however compared to previous study which was noncompressible ABI may be falsely elevated side suggesting medial calcification. The left ABI suggested medial calcification. 08/01/2016 -- the patient had more redness and pain on her right foot and did not get to come to see as noted she see her PCP or go to the ER and decided to take some leftover metronidazole which she had at home. As usual, the patient does report she feels and is rather noncompliant. 08/08/2016 -- -- x-ray of the right foot -- FINDINGS:Transmetatarsal amputation is noted. No bony destruction is noted to suggest osteomyelitis. IMPRESSION: No evidence of osteomyelitis. Postsurgical changes are seen. MRI would be more sensitive  for possible bony changes. Culture has grown Serratia Marcescens -- sensitive to Bactrim, ciprofloxacin, ceftazidime she was seen by [name] on  08/06/2016. He did not find any exposed bone, muscle, tendon, ligament or joint. There was no malodor and he did a excisional debridement in the office. ============ Old notes: 49 year old patient who is known to the wound clinic for a while had been away from the wound clinic since 09/01/2014. Over the last several months she has been admitted to various hospitals including [place] at [facility]. She was treated for a right metatarsal osteomyelitis with a transmetatarsal amputation and this was done about 2 months ago. He has a small ulcerated area on the right heel and she continues to have an ulcerated area on the left plantar aspect of the foot. The patient was recently admitted to the [facility] hospital group between 7/12 and 10/18/2014. she was given 3 weeks of IV vancomycin and was to follow-up with her surgeons at [facility] and also took oral vancomycin for C. difficile colitis. 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 MRI of the left foot done on 10/14/2014 showed no abscess or osteomyelitis. 04/27/15; this is a patient we know from previous stays in the wound care center. She is a type I diabetic I am not sure of her control currently. Since the last time I saw her she is had a right transmetatarsal amputation and has no wounds on her right foot and has no open wounds. She is been followed at the wound care center at [facility] in [place]. She comes today with the desire to undergo hyperbaric treatment locally. Apparently one of her wound care providers in [place] has suggested hyperbarics. This is in response to an MRI from 04/18/15 that showed increased marrow signal and loss of the proximal  fifth metatarsal cortex evidence of osteomyelitis with likely early osteomyelitis in the cuboid bone as well. She has a large wound over the base of the fifth metatarsal. She also has a eschar over her the tips of her toes on 1,3 and 5. She does not have peripheral pulses and apparently is going for an angiogram tomorrow which seems reasonable. After this she is going to infectious disease at [facility]. They have been using Medihoney to the large wound on the lateral aspect of the left foot to. The patient has known Charcot deformity from diabetic neuropathy. She also has known diabetic PAD. Surprisingly I can't see that she has had any recent antibiotics, the patient states the last antibiotic she had was at the end of November for 10 days. I think this was in response to culture that showed group G strep although I'm not exactly sure where the culture was from. She is also had arterial studies on 03/29/15. This showed a right ABI of 1.4 that was noncompressible. Her left ABI was 0.73. 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 It was not felt that arterial inflow was adequate for healing of a foot ulcer. Her Doppler waveforms looked monophasic ===== READMISSION 02/28/17; this is in an now 48 year old woman we've had at several different occasions in this clinic. 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 She has a remote ex-smoker. She was last seen in this  clinic by Dr. Con Memos I think in May. More recently she is been followed by her podiatrist [name] an infectious disease Dr. Megan Salon. She has 2 open wounds the major one is over the right first metatarsal head she also has a wound on the left plantar foot. an MRI of the right foot on 01/01/17 showed a soft tissue ulcer along the plantar aspect of the first metatarsal base consistent with osteomyelitis of the first metatarsal stump. Dr. 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 According to the patient this is been open for slightly over a month. She has been on a combination of Cipro 500 twice a day, Zyvox 600 twice a day and Flagyl 500 3 times a day for over a month now as directed by Dr. Megan Salon. cultures of the right foot earlier this year showed MRSA in January and Serratia in May. January also had a few viridans strep. Recent x-rays of both feet were done and [name] office and I don't have these reports. The patient has known PAD and has a history of aleft femoropopliteal bypass in April 2017. 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 Dwyane Dee. In spite of this the last hemoglobin A1c I can see is 10.1 on 01/01/2017. She is being referred by 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 She is been using Medihoney to this area and also an area on the left midfoot. She is using healing sandals bilaterally. ABIs in this clinic at the left posterior tibial was 1.1 noncompressible on the right READMISSION Non invasive vascular NOVANT 5/18 Aftercare following surgery of the circulatory system Procedure Note - Interface, External Ris In - 08/13/2016 11:05 AM EDT Procedure: Examination consists of physiologic resting arterial pressures of the brachial and ankle arteries bilaterally with continuous wave Doppler waveform analysis. Previous: Previous exam performed on 02/14/16 demonstrated ABIs of Rt = 1.19 and Lt = 1.33. Right: ABI = non-compressible PT, 1.47 DP. S/P transmet amputation. Left: ABI = 1.52, 2nd digit pressure = 87 mmHg Conclusions: Right: ABI (>1.3) may be falsely elevated, suggesting medial calcification. Left: ABI (>1.3) may be  falsely elevated, suggesting medial calcification The patient is a now 48 year old type I diabetic is had multiple issues her graded to chronic diabetic foot ulcers. 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 We had her in the clinic lastin November. At that point she had wounds on her bilateral feet.she had wanted to try hyperbarics however the healogics review process denied her because she hadn't followed up with her vascular surgeon for her left femoropopliteal bypass. The bypass was done by Dr. Raul Del at [facility]. We made her a follow-up with Dr. Raul Del however she did not keep the appointment and therefore she was not approved The patient shows me a small wound on her left fourth metatarsal head on her phone. She developed rapid discoloration in the plantar aspect of the left foot and she was admitted to hospital from 2/2 through 05/10/17 with wet gangrene of the left foot osteomyelitis of the fourth metatarsal heads. She was admitted acutely ill with a temperature of 103. She was started on broad-spectrum vancomycin and cefepime. On 05/06/17  she was taken to the OR by [name] her podiatric surgeon for an incision and drainage irrigation of the left foot wound. Cultures from this surgery revealed group be strep and anaerobes. she was seen by Dr.Xu of orthopedic surgery and scheduled for a below-knee amputation which she u refused. Ultimately she was discharged on Levaquin and Flagyl for one month. MRI 05/05/17 done while she was in the hospital showed abscess adjacent to the fourth metatarsal head and neck small abscess around the fourth flexor tendon. Inflammatory phlegmon and gas in the soft tissues along the lateral aspect of the fourth phalanx. Findings worrisome for osteomyelitis involving the fourth proximal and middle phalanx and also the third and fourth metatarsals. Finally the patient had actually  shortly before this followed up with Dr. Raul Del at no time on 04/29/17. He felt that her left femoropopliteal bypass was patent he felt that her left-sided toe pressures more than adequate for healing a wound on the left foot. This was before her acute presentation. 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 05/28/17; she is started hyperbarics. The patient tells me that for some reason she was not actually on Levaquin but I think on ciprofloxacin. She was on Flagyl. She only started her Levaquin yesterday due to some difficulty with the pharmacy and perhaps her sister picking it up. She has an appointment with [name] tomorrow and with infectious disease early next week. She has no new complaints 06/06/17; the patient continues in hyperbarics. She saw [name] on 05/29/17 who is her podiatric surgeon. He is elected for a transmetatarsal amputation on 06/27/17. I'm not sure at what level he plans to do this amputation. The patient is unaware She also saw Dr. Megan Salon of infectious disease who elected to continue her on current antibiotics I think this is ciprofloxacin and Flagyl. I'll need to clarify with her tomorrow if she actually has this. We're using silver alginate to the actual wound. Necrotic surface today with material under the flap of her foot. Original MRI showed abscesses as well as osteomyelitis of the proximal and middle fourth phalanx and the third and fourth metatarsal heads 06/11/17; patient continues in hyperbarics and continues on oral antibiotics. She is doing well. The wound looks better. The necrotic part of this under the flap in her superior foot also looks better. she is been to see [name]. I haven't had a chance to look at his note. Apparently he has put the transmetatarsal amputation on hold her request it is still planning to take her to the OR for debridement and product application ACEL. I'll see if I can find his note. I'll therefore leave product ordering/requests  to [name] for now. I was going to look at Dermagraft 06/18/17-she is here in follow-up evaluation for bilateral foot wounds. She continues with hyperbaric therapy. She states she has been applying manuka honey to the right plantar foot and alternate manuka honey and silver alginate to the left foot, despite our orders. We will continue with same treatment plan and she will follo up next week. 06/25/17; I have reviewed [name] last note from 3/11. She has operative debridement in 2 days' time. By review his note apparently they're going to place there is skin over the majority of this wound which is a good choice. She has a small satellite area at the most proximal part of this wound on the left plantar foot. The area on the right plantar foot we've been using silver alginate and it is  close to healing. 07/02/17; unfortunately the patient was not easily approved for [name] proposed surgery. I'm not completely certain what the issue is. She has been using silver alginate to the wound she has completed a first course of hyperbarics. She is still on Levaquin and Flagyl. I have really lost track of the time course here.I suspect she should have another week to 2 of antibiotics. I'll need to see if she is followed up with infectious disease Dr. Megan Salon 07/09/17; the patient is followed up with Dr. Megan Salon. 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 She continues on Levaquin and metronidazole continuing both of these for now I think she is been on fr about 6 weeks. She still has some drainage but no pain. No fever. Her had been plans for her to go to the OR for operative debridement with her podiatrist [name], I am not exactly sure where that is. I'll probably slip a note to [name] today. I note that she follows with Dr. Dwyane Dee of endocrinology. We have her recertified for hyperbaric oxygen. I have not heard about Dermagraft however I'll see if [name]  is planning a skin substitute as well 07/16/17; the patient tells me she is just about out of [place]. I'll need to check Dr. Hale Bogus last notes on this. She states she has plenty of Flagyl however. She comes in today complaining of pain in the right lateral foot which she said lasted for about a day. The wound on the right foot is actually much more medially. She also tells me that the [facility] cost a lot of pain in the left foot wound and she turned back to silver alginate. Finally Dermagraft has a $478 per application co-pay. She cannot afford this 07/23/17; patient arrives today with the wound not much smaller. There is not much new to add. She has not heard from [name] all try to put in a call to them today. She was asking about Dermagraft again and she has an over $295 per application co-pay she states that she would be willing to try to do a payment plan. I been tried to avoid this. We've been using silver alginate, I'll change to [facility] 07/30/17-She is here in follow-up evaluation for left foot ulcer. She continues hyperbaric medicine. The left foot ulcer is stable we will continue with same treatment plan 08/06/17; she is here for evaluation of her left foot ulcer. Currently being treated for hyperbarics or underlying osteomyelitis. She is completed antibiotics. The left foot ulcer is better smaller with healthier looking granulation. For various reasons I am not really clear on we never got her back to the OR with [name]. He did not respond to my secure text message. Nevertheless I think that surgery on this point is not necessary nor am I completely clear that a skin substitute is necessary The patient is complaining about pain on the outside of her right foot. 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 There is no erythema. She also states the foot is warm versus her other part of her upper leg and this is largely true. It is not totally clear to me  what's causing this. She thinks it's different from her usual neuropathy pain 08/13/17; she arrives in clinic today with a small wound which is superficial on her right first metatarsal head. 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 She tells [place] she was up on  her feet over the Mother's Day celebration. The large wound is on the left foot. Continues with hyperbarics for underlying osteomyelitis. We're using Hydrofera Blue. She asked me today about where we were with Dermagraft. I had actually excluded this because of the co- pay however she wants to assume this therefore I'll recheck the co-pay an order for next week. 08/20/17; the patient agreed to accept the co-pay of the first Apligraf which we applied today. She is disappointed she is finishing hyperbarics will run this through the insurance on the extent of the foot infection and the extent of the wound that she had however she is already had 60 dive's. Dermagraft No. 1 08/27/17; Dermagraft No. 2. She is not eligible for any more hyperbaric treatments this month. She reports a fair amount of drainage and she actually changed to the external dressings without disturbing the direct contact layer 09/03/17; the patient arrived in clinic today with the wound superficially looking quite healthy. Nice vibrant red tissue with some advancing epithelialization although not as much adherence of the flap as I might like. However she noted on her own fourth toe some bogginess and she brought that to our attention. Indeed this was boggy feeling like a possibility of subcutaneous fluid. 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 She is not been unwell. We've been using Dermagraft 09/10/17; the culture that I did not last week was MRSA. She saw Dr. Megan Salon this morning who is going to start her on vancomycin. I had sent him a secure a text message yesterday. I also spoke  with her podiatric surgeon [name] about surgery on this foot the options for conserving a functional foot etc. Promised me he would see her and will make back consultation today. Paradoxically her actual wound on the plantar aspect of her left foot looks really quite good. I had given her 5 days worth of Baxdella to cover her for MRSA. Her MRI came back showing osteomyelitis within the third metatarsal shaft and head and base of the third and fourth proximal phalanx. She had extensive inflammatory changes throughout the soft tissue of the lateral forefoot. With an ill-defined fluid around the fourth metatarsal extending into the plantar and dorsal soft tissues 09/19/17; the patient is actually on oral Septra and Flagyl. She apparently refused IV vancomycin. She also saw [name] at my request who is planning her for a left BKA sometime in mid July. MRI showed osteomyelitis within the third metatarsal shaft and head and the basis of the third and fourth proximal phalanx. I believe there was felt to be possible septic arthritis involving the third MTP. 09/26/17; the patient went back to Dr. Megan Salon at my suggestion and is now receiving IV daptomycin. Her wound continues to look quite good making the decision to proceed with a transmetatarsal amputation although more difficult for the patient. I believe in my extensive discussions with her she has a good sense of the pros and cons of this. I don't NV the tuft decision she has to make. She has an appointment with [name] I believe in mid July and I previously spoken to him about this issue Has we had used 3 previous Dermagraft. Given the condition of the wound surface I went ahead and added the fourth one today area and I did this not fully realizing that she'll be traveling to [place] next week. I'm hopeful she can come  back in 2 weeks 10/21/17; Her same Dermagraft on for about 3-1/2 weeks. In spite of this the wound arrives looking quite  healthy. There is been a lot of healing dimensions are smaller. Looking at the square shaped wound she has now there is some undermining and some depth medially under the undermining although I cannot palpate any bone. No surrounding infection is obvious. She has difficult questions about how to look at this going forward vis--vis amputations versus continued medical therapy. To be truthful the wound is looks so healthy and it is continued to contract. Hard to justify foot surgery at this point although I still told her that I think it might come to that if we are not able to eradicate the underlying MRSA. She is still highly at risk and she understands this 11/06/17 on evaluation today patient appears to be doing better in regard to her foot ulcer. She's been tolerating the dressing changes without complication. Currently she is here for her Dermagraft #6. Her wound continues to make excellent progress at this point. She does not appear to have any evidence of infection which is good news. 11/13/17 on evaluation today patient appears to be doing excellent at this time. She is here for repeat Dermagraft application. This is #7. Overall her wound seems to be making great progress. 12/05/17; the patient arrives with the wound in much better condition than when I last saw this almost 6 weeks ago. She still has a small probing area in the left metatarsal head region on the lateral aspect of her foot. We applied her last Dermagraft today. Since the last time she is here she has what appears to a been a blood blister on the plantar aspect of left foot although I don't see this is threatening. There is also a thick raised tissue on the right mid metatarsal head region. This was not there I don't think the last time she was here 3 weeks ago. 12/12/17; the patient continues to have a small programming area in the left metatarsal head region on the lateral aspect of her foot which was the initial large surgical  wound. I applied her last Apligraf last week. I'm going to use Endoform starting today Unfortunately she has an excoriated area in the left mid foot and the right mid foot. The left midfoot looks like a blistered area this was not opened last week it certainly is open today. Using silver alginate on these areas. She promises me she is offloading this. 12/19/17; the small probing area in the left metatarsal head eyes think is shallower. In general her original wound looks better. We've been using Endoform. The area inferiorly that I think was trauma last week still requires debridement a lot of nonviable surface which I removed. She still has an open open area distally in her foot Similarly on the right foot there is tightly adherent surface debris which I removed. Still areas that don't look completely epithelialized. This is a small open area. We used silver alginate on these areas 12/26/2017; the patient did not have the supplies we ordered from last week including the Endoform. The original large wound on the left lateral foot looks healthy. She still has the undermining area that is largely unchanged from last week. She has the same heavily callused raised edged wounds on the right mid and left midfoot. Both of these requiring debridement. We have been using silver alginate on these areas 01/02/2018; there is still supply issues. We are going to try  to use Prisma but I am not sure she actually got it from what she is saying. She has a new open area on the lateral aspect of the left fourth toe [previous fifth ray amputation]. Still the one tunneling area over the fourth metatarsal head. The area is in the midfoot bilaterally still have thick callus around them. She is concerned about a raised swelling on the lateral aspect of the foot. However she is completely insensate 01/10/2018; we are using Prisma to the wounds on her bilateral feet. Surprisingly the tunneling area over the left fourth  metatarsal head that was part of her original surgery has closed down. She has a small open area remaining on the incision line. 2 open areas in the midfoot. 02/10/2018; the patient arrives back in clinic after a month hiatus. She was traveling to visit family in [place]. Is fairly clear she was not offloading the areas on her feet. The original wound over the left lateral foot at the level of metatarsal heads is reopened and probes medially by about a centimeter or 2. She notes that a week ago she had purulent drainage come out of an area on the left midfoot. Paradoxically the worst area is actually on the right foot is extensive with purulent drainage. We will use silver alginate today 02/17/2018; the patient has 3 wounds one over the left lateral foot. She still has a small area over the metatarsal heads which is the remnant of her original surgical wound. This has medial probing depth of roughly 1.4 cm somewhat better than last week. The area on the right foot is larger. We have been using silver alginate to all areas. The area on the right foot and left foot that we cultured last week showed both Klebsiella and Proteus. Both of these are quinolone sensitive. The patient put her's self on Bactrim and Flagyl that she had left hanging around from prior antibiotic usages. She was apparently on this last week when she arrived. I did not realize this. Unfortunately the Bactrim will not cover either 1 of these organisms. We will send in Cipro 500 twice daily for a week 03/04/2018; the patient has 2 wounds on the left foot one is the original wound which was a surgical wound for a deep DFU. At one point this had exposed bone. She still has an area over the fourth metatarsal head that probes about 1.4 cm although I think this is better than last week. I been using silver nitrate to try and promote tissue adherence and been using silver alginate here. She also has an area in the left midfoot. This has  some depth but a small linear wound. Still requiring debridement. On the right midfoot is a circular wound. A lot of thick callus around this area. We have been using silver alginate to all wound areas She is completed the ciprofloxacin I gave her 2 weeks ago. 03/11/2018; the patient continues to have 2 open areas on the left foot 1 of which was the original surgical wound for a deep DFU. Only a small probing area remains although this is not much different from last week we have been using silver alginate. The other area is on the midfoot this is smaller linear but still with some depth. We have been using silver alginate here as well On the right foot she has a small circular wound in the mid aspect. This is not much smaller than last time. We have been using silver alginate here as well  03/18/2018; she has 3 wounds on the left foot the original surgical wound, a very superficial wound in the mid aspect and then finally the area in the mid plantar foot. She arrives in today with a very concerning area in the wound in the mid plantar foot which is her most proximal wound. There is undermining here of roughly 1-1/2 cm superiorly. Serosanguineous drainage. She tells me she had some pain on for over the weekend that shot up her foot into her thigh and she tells me that she had a nodule in the groin area. She has the single wound in the right foot. We are using endoform to both wound areas 03/24/2018; the patient arrives with the original surgical wound in the area on the left midfoot about the same as last week. There is a collection of fluid under the surface of the skin extending from the surgical wound towards the midfoot although it does not reach the midfoot wound. The area on the right foot is about the same. Cultures from last week of the left midfoot wound showed abundant Klebsiella abundant Enterococcus faecalis and moderate methicillin resistant staph I gave her Levaquin but this would  have only covered the Klebsiella. She will need linezolid 04/01/2018; she is taking linezolid but for the first few days only took 1 a day. I have advised her to finish this at twice daily dosing. In any case all of her wounds are a lot better especially on the left foot. The original surgical wound is closed. The area on the left midfoot considerably smaller. The area on the right foot also smaller. 04/08/2018; her original surgical wound/osteomyelitis on the left foot remains closed. She has area on the left foot that is in the midfoot area but she had some streaking towards this. This is not connected with her original wound at least not visually. Small wound on the right midfoot appears somewhat smaller. 04/15/18; both wounds looks better. Original wound is better left midfoot. Using silver alginate 1/21; patient states she uses saltwater soak in, stones or remove callus from around her wounds. She is also concerned about a blood blister she had on the left foot but it simply resolved on its own. We've been using silver alginate 1/28; the patient arrives today with the same streaking area from her metatarsals laterally [the site of her original surgical wound] down to the middle of her foot. There is some drainage in the subcutaneous area here. This concerns me that there is actually continued ongoing infection in the metatarsals probably the fourth and third. This fixates an MRI of the foot without contrast [chronic renal failure] The wound in the mid part of the foot is small but I wonder whether this area actually connects with the more distal foot. The area on the right midfoot is probably about the same. Callus thick skin around the small wound which I removed with a curette we have been using silver alginate on both wound areas 2/4; culture I did of the draining site on the left foot last time grew methicillin sensitive staph aureus. MRI of the left foot showed interval resolution of the  findings surrounding the third metatarsal joint on the prior study consistent with treated osteomyelitis. Chronic soft tissue ulceration in the plantar and lateral aspect of the forefoot without residual focal fluid collection. No evidence of recurrent osteomyelitis. Noted to have the previous amputation of the distal first phalanx and fifth ray MRI of the right foot showed no evidence of  osteomyelitis I am going to treat the patient with a prolonged course of antibiotics directed against MSSA in the left foot 2/11; patient continues on cephalexin. She tells me she had nausea and vomiting over the weekend and missed 2 days. In general her foot looks much the same. She has a small open area just below the left fourth metatarsal head. A linear area in the left midfoot. Some discoloration extending from the inferior part of this into the left lateral foot although this appears to be superficial. She has a small area on the right midfoot which generally looks smaller after debridement 2/18; the patient is completing his cephalexin and has another 2 days. She continues to have open areas on the left and right foot. 2/25; she is now off antibiotics. The area on the left foot at the site of her original surgical wound has closed yet again. She still has open areas in the mid part of her foot however these appear smaller. The area on the right mid foot looks about the same. We have been using silver alginate She tells me she had a serious hypoglycemic spell at home. She had to have EMS called and get IV dextrose 3/3; disappointing on the left lateral foot large area of necrotic tissue surrounding the linear area. This appears to track up towards the same original surgical wound. Required extensive debridement. The area on the right plantar foot is not a lot better also using silver 3/12; the culture I did last time showed abundant enterococcus. I have prescribed Augmentin, should cover any unrecognized  anaerobes as well. In addition there were a few MRSA and Serratia that would not be well covered although I did not want to give her multiple antibiotics. She comes in today with a new wound in the right midfoot this is not connected with the original wound over her MTP a lot of thick callus tissue around both wounds but once again she said she is not walking on these areas 3/17-Patient comes in for follow-up on the bilateral plantar wounds, the right midfoot and the left plantar wound. Both these are heavily callused surrounding the wounds. We are continuing to use silver alginate, she is compliant with offloading and states she uses a wheelchair fairly often at home 3/24; both wound areas have thick callus. However things actually look quite a bit better here for the majority of her left foot and the right foot. 3/31; patient continues to have thick callused somewhat irritated looking tissue around the wounds which individually are fairly superficial. There is no evidence of surrounding infection. We have been using silver alginate however I change that to [facility] today 4/17; patient returns to clinic after having a scare with Covid she tested negative in her primary doctor's office. She has been using Hydrofera Blue. She does not have an open area on the right foot. On the left foot she has a small open area with the mid area not completely viable. She showed me pictures of what looks like a hemorrhagic blister from several days ago but that seems to have healed over this was on the lateral left foot 4/21; patient comes in to clinic with both her wounds on her feet closed. However over the weekend she started having pain in her right foot and leg up into the thigh. She felt as though she was running a low-grade fever but did not take her temperature. She took a doxycycline that she had leftover and yesterday a single Septra  and metronidazole. She thinks things feel somewhat  better. 4/28; duplex ultrasound I ordered last week was negative for DVT or superficial thrombophlebitis. She is completed the doxycycline I gave her. States she is still having a lot of pain in the right calf and right ankle which is no better than last week. She cannot sleep. She also states she has a temperature of up to 101, coughing and complaining of visual loss in her bilateral eyes. Apparently she was tested for Covid 2 weeks ago at [facility] and that was negative. Readmission: 09/03/18 patient presents back for reevaluation after having been evaluated at the end of April regarding erythema and swelling of her right lower extremity. Subsequently she ended up going to the hospital on 07/29/18 and was admitted not to be discharged until 08/08/18. Unfortunately it was noted during the time that she was in the hospital that she did have methicillin-resistant Staphylococcus aureus as the infection noted at the site. It was also determined that she did have osteomyelitis which appears to be fairly significant. She was treated with vancomycin and in fact is still on IV vancomycin at dialysis currently. This is actually slated to continue until 09/12/18 at least which will be the completion of the six weeks of therapy. Nonetheless based on what I'm seeing at this point I'm not sure she will be anywhere near ready to discontinue antibiotics at that time. Since she was released from the hospital she was seen by [name] who is her podiatrist on 08/27/18. His note specifically states that he is recommended that the patient needs of one knee amputation on the right as she has a life-threatening situation that can lead quickly to sepsis. The patient advised she would like to try to save her leg to which [name] apparently told her that this was against all medical advice. She also want to discontinue the Wound VAC which had been initiated due to the fact that she wasn't pleased with how the wound was looking  and subsequently she wanted to pursue applying Medihoney at that time. He stated that he did not believe that the right lower extremity was salvageable and that the patient understood but would still like to attempt hyperbaric option therapy if it could be of any benefit. She was therefore referred back to [place] for further evaluation. He plans to see her back next week. Upon inspection today patient has a significant amount purulent drainage noted from the wound at this point. The bone in the distal portion of her foot also appears to be extremely necrotic and spongy. When I push down on the bone it bubbles and seeps purulent drainage from deeper in the end of the foot. I do not think that this is likely going to heal very well at all and less aggressive surgical debridement were undertaken more than what I believe we can likely do here in our office. 09/12/2018; I have not seen this patient since the most recent hospitalization although she was in our clinic last week. I have reviewed some of her records from a complex hospitalization. 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 There is situation was complicated by MRSA bacteremia and acute on chronic renal failure now on dialysis. She is receiving vancomycin at dialysis. We started her on Dakin's wet-to- dry last week she is changing this daily. There is still purulent drainage coming out of her foot. Although she is apparently "agreeable" to a below-knee amputation which  is been suggested by multiple clinicians she wants this to be done in [place]. She apparently has a telehealth visit with that provider sometime in late [place] 6/24. I have told her I think this is probably too long. Nevertheless I could not convince her to allow a local doctor to perform BKA. 09/19/2018; the patient has a large necrotic area on the right anterior foot. She has had previous transmetatarsal amputations. Culture I did  last week showed MRSA nothing else she is on vancomycin at dialysis. She has continued leaking purulent drainage out of the distal part of the large circular wound on the right anterior foot. She apparently went to see Dr. 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 Somehow that translated into her being referred to plastic surgery for debridement of the area. I gather she basically refused amputation although I do not have a copy of Dr. Berenice Primas notes. The patient really wants to have a trial of hyperbaric oxygen. I agreed with initial assessment in this clinic that this was probably too far along to benefit however if she is going to have plastic surgery I think she would benefit from ancillary hyperbaric oxygen. The issue here is that the patient has benefited as maximally as any patient I have ever seen from hyperbaric oxygen therapy. Most recently she had exposed bone on the lateral part of her left foot after a surgical procedure and that actually has closed. She has eschared areas in both heels but no open area. She is remained systemically well. I am not optimistic that anything can be done about this but the patient is very clear that she wants an attempt. The attempt would include a wound VAC further debridements and hyperbaric oxygen along with IV antibiotics. 6/26; I put her in for a trial of hyperbaric oxygen only because of the dramatic response she has had with wounds on her left midfoot earlier this year which was a surgical wound that went straight to her bone over the metatarsal heads and also remotely the left third toe. We will see if we can get this through our review process and insurance. She arrives in clinic with again purulent material pouring out of necrotic bone on the top of the foot distally. There is also some concerning erythema on the front of the leg that we marked. It is bit difficult to tell how tender this is because of neuropathy. I  note from infectious disease that she had her vancomycin extended. 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 She had the wound VAC on for part of the week. The rest of the time she is putting various things on this including Medihoney, "ionized water" silver sorb gel etc. 7/7; follow-up along with HBO. She is still on vancomycin at dialysis. She has a large open area on the dorsal right foot and a small dark eschar area on her heel. There is a lot less erythema in the area and a lot less tenderness. From an infection point of view I think this is better. She still has a lot of necrosis in the remaining right forefoot [previous TMA] we are still using the wound VAC in this area 7/16; follow-up along with HBO. I put her on linezolid after she finished her vancomycin. We started this last Friday I gave her 2 weeks worth. I had the expectation that she would be operatively debrided by Dr. Marla Roe but that still has not happened yet. Patient phoned the  office this week. She arrives for review today after HBO. The distal part of this wound is completely necrotic. Nonviable pieces of tendon bone was still purulent drainage. Also concerning that she has black eschar over the heel that is expanding. I think this may be indicative of infection in this area as well. She has less erythema and warmth in the ankle and calf but still an abnormal exam 7/21 follow-up along with HBO. I will renew her linezolid after checking a CBC with differential monitoring her blood counts especially her platelets. She was supposed to have surgery yesterday but if I am reading things correctly this was canceled after her blood sugar was found to be over 500. I thought Dr. Marla Roe who called me said that they were sending her to the ER but the patient states that was not the case. 7/28. Follow-up along with HBO. She is on linezolid I still do not have any lab work from dialysis even though  I called last week. The patient is concerned about an area on her left lateral foot about the level of the base of her fifth metatarsal. I did not really see anything that ominous here however this patient is in [place] ability to point out problems that she is sensing and she has been accurate in the past Finally she received a call from Dr. Marla Roe who is referring her to another orthopedic surgeon stating that she is too booked up to take her to the operating room now. Was still using a wound VAC on the foot 8/3 -Follow-up after HBO, she is got another week of linezolid, she is to call ID for an appointment, x-rays of both feet were reviewed, the left foot x-ray with third MTP joint osteo- Right foot x-ray widespread osteo-in the right midfoot Right ankle x-ray does not show any active evidence of infection 8/11-Patient is seen after HBO, the wounds on the right foot appear to be about the same, the heel wound had some necrotic base over tendon that was debrided with a curette 8/21; patient is seen after HBO. The patient's wound on her dorsal foot actually looks reasonably good and there is substantial amount of epithelialization however the open area distally still has a lot of necrotic debris partially bone. I cannot really get a good sense of just how deep this probes under the foot. She has been pressuring me this week to order medical maggots through a company in [place] for her. The problem I have is there is not a defined wound area here. On the positive side there is no purulence. She has been to see infectious disease she is still on Septra DS although I have not had a chance to review their notes 8/28; patient is seen in conjunction with HBO. The wounds on her foot continued to improve including the right dorsal foot substantially the, the distal part of this wound and the area on the right heel. We have been using a wound VAC over this chronically. She is still on trimethoprim  as directed by infectious disease 9/4; patient is seen in conjunction with HBO. Right dorsal foot wound substantially anteriorly is better however she continues to have a deep wound in the distal part of this that is not responding. We have been using silver collagen under border foam Area on the right plantar medial heel seems better. We have been using Hydrofera Blue 12/12/18 on evaluation today patient appears to be doing about the same with regard to her wound based  on prior measurements. She does have some necrotic tissue noted on the lateral aspect of the wound that is going require a little bit of sharp debridement today. This includes what appears to be potentially either severely necrotic bone or tendon. Nonetheless other than that she does not appear to have any severe infection which is good news 9/18; it is been 2 weeks since I saw this wound. She is tolerating HBO well. Continued dramatic improvement in the area on the right dorsal foot. She still has a small wound on the heel that we have been using Hydrofera Blue. She continues with a wound VAC 9/24; patient has to be seen emergently today with a swelling on her right lateral lower leg. She says that she told Dr. Evette Doffing about this and also myself on a couple of occasions but I really have no recollection of this. She is not systemically unwell and her wound really looked good the last time I saw this. She showed this to providers at dialysis and she was able to verify that she was started on cephalexin today for 5 doses at dialysis. She dialyzes on Tuesday Thursday and Saturday. 10/2; patient is seen in conjunction with HBO. The area that is draining on the right anterior medial tibia is more extensive. Copious amounts of serosanguineous drainage with some purulence. We are still using the wound VAC on the original wound then it is stable. Culture I did of the original IandD showed MRSA I contacted dialysis she is now on vancomycin  with dialysis treatments. I asked them to run a month Electronic Signature(s) Signed: 01/02/2019 6:19:43 PM By: Linton Ham MD Entered By: Linton Ham on 01/02/2019 16:54:34 -------------------------------------------------------------------------------- Incision and Drainage Details Patient Name: [name] Date of Service: 01/02/2019 12:30 PM Medical Record Number:[id number] Patient Account Number: [id number] Date of Birth/Sex: Treating RN: Dec 15, 1970 (48 y.o. [name] Primary Care Provider: [name] Other Clinician: Referring Provider: Treating Provider/Extender:[name], [name], [name] in Treatment: 17 Incision And Drainage Wound #46 Right, Lateral Lower Leg Performed for: Performed By: Physician Ricard Dillon., MD Incision And Drainage Abscess Type: Level of Consciousness Awake and Alert (Pre-procedure): Pre-procedure Yes - 16:30 Verification/Time Out Taken: Pain Control: Lidocaine Injectable Drainage Of: Purulent, Sero-Sanguineous Instrument: Blade Bleeding: Minimum Hemostasis Achieved: Pressure Culture Sent: None Procedural Pain: 3 Post Procedural Pain: 1 Response to Treatment: Procedure was tolerated well Level of Consciousness Awake and Alert (Post-procedure): Post Procedure Diagnosis Same as Pre-procedure Electronic Signature(s) Signed: 01/02/2019 7:05:19 PM By: Baruch Gouty RN, BSN Signed: 01/05/2019 6:11:18 PM By: Linton Ham MD Previous Signature: 01/02/2019 6:19:43 PM Version By: Linton Ham MD Entered By: Baruch Gouty on 01/02/2019 18:27:16 -------------------------------------------------------------------------------- Physical Exam Details Patient Name: Date of Service: [name] 01/02/2019 12:30 PM Medical Record Number:[id number] Patient Account Number: [id number] Date of Birth/Sex: Treating RN: March 19, 1971 (48 y.o. [name] Primary Care Provider: [name] Other  Clinician: Referring Provider: Treating Provider/Extender:[name], [name], [name] in Treatment: 17 Constitutional Temperature is normal and within the target range for the patient.. Notes Wound exam; original wound looks about the same. The debris on the lower part of the wound is still present but not particularly worse. Tissue looks fairly good. The real problem is the noticeable swelling and drainage in the lower part of the leg. Serosanguineous drainage with pressure some purulence is obvious. Using a #15 blade I asked distended the original incision I made last week in order for this to drain more noticeably. Electronic Signature(s) Signed:  01/02/2019 6:19:43 PM By: Linton Ham MD Entered By: Linton Ham on 01/02/2019 16:49:49 -------------------------------------------------------------------------------- Physician Orders Details Patient Name: Date of Service: [name] 01/02/2019 12:30 PM Medical Record Number:[id number] Patient Account Number: [id number] Date of Birth/Sex: Treating RN: May 19, 1970 (48 y.o. [name] Primary Care Provider: [name] Other Clinician: Referring Provider: Treating Provider/Extender:[name], [name], [name] in Treatment: 17 Verbal / Phone Orders: No Diagnosis Coding ICD-10 Coding Code Description [phone] Other chronic osteomyelitis, right ankle and foot L97.514 Non-pressure chronic ulcer of other part of right foot with necrosis of bone E10.621 Type 1 diabetes mellitus with foot ulcer L02.415 Cutaneous abscess of right lower limb Follow-up Appointments Return Appointment in 1 week. Dressing Change Frequency Wound #43 Right,Medial Foot Change dressing three times week. Wound #44 Right Calcaneus Change dressing three times week. Wound #46 Right,Lateral Lower Leg Change dressing three times week. Wound Cleansing Wound #43 Right,Medial Foot Clean wound with Wound Cleanser -  anasept Wound #44 Right Calcaneus Clean wound with Wound Cleanser - anasept Wound #46 Right,Lateral Lower Leg Clean wound with Wound Cleanser - anasept Primary Wound Dressing Wound #43 Right,Medial Foot Santyl Ointment - under black foam Other: - silver alginate in clinic. Resume wound vac at home. Wound #44 Right Calcaneus Hydrofera Blue Wound #46 Right,Lateral Lower Leg Calcium Alginate with Silver - cover wound. do not pack. Secondary Dressing Wound #43 Right,Medial Foot Kerlix/Rolled Gauze ABD pad Wound #44 Right Calcaneus Kerlix/Rolled Gauze Dry Gauze Heel Cup Wound #46 Right,Lateral Lower Leg Kerlix/Rolled Gauze ABD pad Negative Presssure Wound Therapy Wound #43 Right,Medial Foot Wound Vac to wound continuously at 150mm/hg pressure - apply silver alginate at wound clinic , home health to apply wound vac Black Foam Edema Control Elevate legs to the level of the heart or above for 30 minutes daily and/or when sitting, a frequency of: - throughout the day [facility] skilled nursing for wound care. - Interim Patient Medications Allergies: Cleocin, lisinopril Notifications Medication Indication Start End lidocaine HCl prior to 01/02/2019 procedure DOSE 1 - injection 10 mg/mL (1 %) solution - 1 ml solution injection Electronic Signature(s) Signed: 01/02/2019 7:05:19 PM By: Baruch Gouty RN, BSN Signed: 01/05/2019 6:11:18 PM By: Linton Ham MD Entered By: Baruch Gouty on 01/02/2019 18:30:15 -------------------------------------------------------------------------------- Problem List Details Patient Name: Date of Service: [name] 01/02/2019 12:30 PM Medical Record Number:[id number] Patient Account Number: [id number] Date of Birth/Sex: Treating RN: 1970/06/12 (48 y.o. [name] Primary Care Provider: [name] Other Clinician: Referring Provider: Treating Provider/Extender:[name], [name], [name] in  Treatment: 17 Active Problems ICD-10 Evaluated Encounter Code Description Active Date Today Diagnosis M86.671 Other chronic osteomyelitis, right ankle and foot 09/03/2018 No Yes L97.514 Non-pressure chronic ulcer of other part of right foot 09/03/2018 No Yes with necrosis of bone E10.621 Type 1 diabetes mellitus with foot ulcer 09/24/2018 No Yes L02.415 Cutaneous abscess of right lower limb 12/25/2018 No Yes Inactive Problems Resolved Problems Electronic Signature(s) Signed: 01/02/2019 6:19:43 PM By: Linton Ham MD Entered By: Linton Ham on 01/02/2019 16:45:31 -------------------------------------------------------------------------------- Progress Note Details Patient Name: Date of Service: [name] 01/02/2019 12:30 PM Medical Record Number:[id number] Patient Account Number: [id number] Date of Birth/Sex: Treating RN: 06-16-1970 (48 y.o. [name] Primary Care Provider: [name] Other Clinician: Referring Provider: Treating Provider/Extender:[name], [name], [name] in Treatment: 17 Subjective History of Present Illness (HPI) 48 year old diabetic who is known to have type 1 diabetes which is poorly controlled last hemoglobin A1c was 11%. She comes in  with a ulcerated area on the left lateral foot which has been there for over 6 months. Was recently she has been treated by [name] of podiatry who saw her last on 05/28/2016. Review of his notes revealed that the patient had incision and drainage with placement of antibiotic beads to the left foot on 04/11/2016 for possible osteomyelitis of the cuboid bone. Over the last year she's had a history of amputation of the left fifth toe and a femoropopliteal popliteal bypass graft somewhere in April 2017. 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 His note [name] mentions that the patient has been referred to me for further wound care and possibly great candidate for hyperbaric  oxygen therapy due to recurrent osteomyelitis. However we do not have any x-rays of biopsy reports confirming this. He has been on several antibiotics including Bactrim and most recently is on doxycycline for an MRSA. I understand, the patient was not a candidate for IV antibiotics as she has had previous PICC lines which resulted in blood clots in both arms. There was a x-ray report dated 04/04/2016 on [name] notes which showed evidence of fifth ray resection left foot with osteolytic changes noted to the fourth metatarsal and cuboid bone on the left. 06/13/2016 -- had a left foot x-ray which showed no acute fracture or dislocation and no definite radiographic evidence of osteomyelitis. Advanced osteopenia was seen. 06/20/2016 -- she has noticed a new wound on the right plantar foot in the region where she had a callus before. 06/27/16- the patient did have her x-ray of the right foot which showed no findings to suggest osteomyelitis. She saw her endocrinologist, [name], yesterday. Her A1c in January was 11. He also indicates mismanagement and noncompliance regarding her diabetes. She is currently on Bactrim for a lip infection. She is complaining of nausea, vomiting and diarrhea. She is unable to articulate the exact orders or dosing of the Bactrim; it is unclear when she will complete this. 07/04/2016 -- results from [facility] of ABIs with ankle waveforms were noted from 02/14/2016. The examination done on 06/27/2015 showed noncompressible ABIs with the right being 1.45 and the left being 1.33. The present examination showed a right ABI of 1.19 on the left of 1.33. The conclusion was that right normal ABI in the lower extremity at rest however compared to previous study which was noncompressible ABI may be falsely elevated side suggesting medial calcification. The left ABI suggested medial calcification. 08/01/2016 -- the patient had more redness and pain on her right foot and did not  get to come to see as noted she see her PCP or go to the ER and decided to take some leftover metronidazole which she had at home. As usual, the patient does report she feels and is rather noncompliant. 08/08/2016 -- -- x-ray of the right foot -- FINDINGS:Transmetatarsal amputation is noted. No bony destruction is noted to suggest osteomyelitis. IMPRESSION: No evidence of osteomyelitis. Postsurgical changes are seen. MRI would be more sensitive for possible bony changes. Culture has grown Serratia Marcescens -- sensitive to Bactrim, ciprofloxacin, ceftazidime she was seen by [name] on 08/06/2016. He did not find any exposed bone, muscle, tendon, ligament or joint. There was no malodor and he did a excisional debridement in the office. ============ Old notes: 48 year old patient who is known to the wound clinic for a while had been away from the wound clinic since 09/01/2014. Over the last several months she has been admitted to various  hospitals including [place] at [facility]. She was treated for a right metatarsal osteomyelitis with a transmetatarsal amputation and this was done about 2 months ago. He has a small ulcerated area on the right heel and she continues to have an ulcerated area on the left plantar aspect of the foot. The patient was recently admitted to the [facility] between 7/12 and 10/18/2014. she was given 3 weeks of IV vancomycin and was to follow-up with her surgeons at [facility] and also took oral vancomycin for C. difficile colitis. 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 MRI of the left foot done on 10/14/2014 showed no abscess or osteomyelitis. 04/27/15; this  is a patient we know from previous stays in the wound care center. She is a type I diabetic I am not sure of her control currently. Since the last time I saw her she is had a right transmetatarsal amputation and has no wounds on her right foot and has no open wounds. She is been followed at the wound care center at [facility] in [place]. She comes today with the desire to undergo hyperbaric treatment locally. Apparently one of her wound care providers in [place] has suggested hyperbarics. This is in response to an MRI from 04/18/15 that showed increased marrow signal and loss of the proximal fifth metatarsal cortex evidence of osteomyelitis with likely early osteomyelitis in the cuboid bone as well. She has a large wound over the base of the fifth metatarsal. She also has a eschar over her the tips of her toes on 1,3 and 5. She does not have peripheral pulses and apparently is going for an angiogram tomorrow which seems reasonable. After this she is going to infectious disease at [facility]. They have been using Medihoney to the large wound on the lateral aspect of the left foot to. The patient has known Charcot deformity from diabetic neuropathy. She also has known diabetic PAD. Surprisingly I can't see that she has had any recent antibiotics, the patient states the last antibiotic she had was at the end of November for 10 days. I think this was in response to culture that showed group G strep although I'm not exactly sure where the culture was from. She is also had arterial studies on 03/29/15. This showed a right ABI of 1.4 that was noncompressible. Her left ABI was 0.73. 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 It was not felt that arterial inflow was adequate for healing of a foot ulcer. Her Doppler waveforms looked monophasic ===== READMISSION 02/28/17; this is in an now 48 year old woman we've had at several different occasions in this clinic. She is a type  I diabetic with peripheral neuropathy Charcot deformity and known PAD. She has a remote ex-smoker. She was last seen in this clinic by Dr. Con Memos I think in May. More recently she is been followed by her podiatrist [name] an infectious disease Dr. Megan Salon. She has 2 open wounds the major one is over the right first metatarsal head she also has a wound on the left plantar foot. an MRI of the right foot on 01/01/17 showed a soft tissue ulcer along the plantar aspect of the first metatarsal base consistent with osteomyelitis of the first metatarsal stump. Dr. 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 According to the patient this is been open for slightly over a month. She has been on a combination of Cipro 500 twice a day, Zyvox 600 twice a day and Flagyl 500 3 times a day for over a month now as directed by Dr. Megan Salon. cultures of the right foot earlier this year showed MRSA in January and Serratia in May. January also had a few viridans strep. Recent x-rays of both feet were done and [name] office and I don't have these reports. The patient has known PAD and has a history of aleft femoropopliteal bypass in April 2017. 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 Dwyane Dee. In spite of this the last hemoglobin A1c I can see is 10.1 on 01/01/2017. She is being referred by 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 She is been using Medihoney to this area and also an area on the left midfoot. She is using healing sandals bilaterally. ABIs in this clinic at the left posterior tibial was 1.1 noncompressible on the right READMISSION Non invasive vascular NOVANT 5/18 Aftercare following  surgery of the circulatory system Procedure Note - Interface, External Ris In - 08/13/2016 11:05 AM EDT Procedure: Examination consists of physiologic resting arterial pressures of the brachial and ankle arteries bilaterally with continuous wave Doppler waveform analysis. Previous: Previous exam performed on 02/14/16 demonstrated ABIs of Rt = 1.19 and Lt = 1.33. Right: ABI = non-compressible PT, 1.47 DP. S/P transmet amputation. Left: ABI = 1.52, 2nd digit pressure = 87 mmHg Conclusions: Right: ABI (>1.3) may be falsely elevated, suggesting medial calcification. Left: ABI (>1.3) may be falsely elevated, suggesting medial calcification The patient is a now 48 year old type I diabetic is had multiple issues her graded to chronic diabetic foot ulcers. 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 We had her in the clinic lastin November. At that point she had wounds on her bilateral feet.she had wanted to try hyperbarics however the healogics review process denied her because she hadn't followed up with her vascular surgeon for her left femoropopliteal bypass. The bypass was done by Dr. Raul Del at [facility]. We made her a follow-up with Dr. Raul Del however she did not keep the appointment and therefore she was not approved The patient shows me a small wound on her left fourth metatarsal head on her phone. She developed rapid discoloration in the plantar aspect of the left foot and she was admitted to hospital from 2/2 through 05/10/17 with wet gangrene of the left foot osteomyelitis of the fourth metatarsal heads. She was admitted acutely ill with a temperature of 103. She was started on broad-spectrum vancomycin and cefepime. On 05/06/17 she was taken to the OR by [name] her podiatric surgeon for an incision and drainage irrigation of the left foot wound. Cultures from this surgery revealed group be strep and  anaerobes. she was seen by Dr.Xu of orthopedic surgery and scheduled for a below-knee amputation which she u refused. Ultimately she was discharged on Levaquin and Flagyl for one month. MRI 05/05/17 done while she was in the hospital showed abscess adjacent to the fourth metatarsal head and neck small abscess around  the fourth flexor tendon. Inflammatory phlegmon and gas in the soft tissues along the lateral aspect of the fourth phalanx. Findings worrisome for osteomyelitis involving the fourth proximal and middle phalanx and also the third and fourth metatarsals. Finally the patient had actually shortly before this followed up with Dr. Raul Del at no time on 04/29/17. He felt that her left femoropopliteal bypass was patent he felt that her left-sided toe pressures more than adequate for healing a wound on the left foot. This was before her acute presentation. 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 05/28/17; she is started hyperbarics. The patient tells me that for some reason she was not actually on Levaquin but I think on ciprofloxacin. She was on Flagyl. She only started her Levaquin yesterday due to some difficulty with the pharmacy and perhaps her sister picking it up. She has an appointment with [name] tomorrow and with infectious disease early next week. She has no new complaints 06/06/17; the patient continues in hyperbarics. She saw [name] on 05/29/17 who is her podiatric surgeon. He is elected for a transmetatarsal amputation on 06/27/17. I'm not sure at what level he plans to do this amputation. The patient is unaware ooShe also saw Dr. Megan Salon of infectious disease who elected to continue her on current antibiotics I think this is ciprofloxacin and Flagyl. I'll need to clarify with her tomorrow if she actually has this. We're using silver alginate to the actual wound. Necrotic surface today with material under the flap of her foot. ooOriginal MRI showed abscesses as well as  osteomyelitis of the proximal and middle fourth phalanx and the third and fourth metatarsal heads 06/11/17; patient continues in hyperbarics and continues on oral antibiotics. She is doing well. The wound looks better. The necrotic part of this under the flap in her superior foot also looks better. she is been to see [name]. I haven't had a chance to look at his note. Apparently he has put the transmetatarsal amputation on hold her request it is still planning to take her to the OR for debridement and product application ACEL. I'll see if I can find his note. I'll therefore leave product ordering/requests to [name] for now. I was going to look at Dermagraft 06/18/17-she is here in follow-up evaluation for bilateral foot wounds. She continues with hyperbaric therapy. She states she has been applying manuka honey to the right plantar foot and alternate manuka honey and silver alginate to the left foot, despite our orders. We will continue with same treatment plan and she will follo up next week. 06/25/17; I have reviewed [name] last note from 3/11. She has operative debridement in 2 days' time. By review his note apparently they're going to place there is skin over the majority of this wound which is a good choice. She has a small satellite area at the most proximal part of this wound on the left plantar foot. The area on the right plantar foot we've been using silver alginate and it is close to healing. 07/02/17; unfortunately the patient was not easily approved for [name] proposed surgery. I'm not completely certain what the issue is. She has been using silver alginate to the wound she has completed a first course of hyperbarics. She is still on Levaquin and Flagyl. I have really lost track of the time course here.I suspect she should have another week to 2 of antibiotics. I'll need to see if she is followed up with infectious disease Dr. Megan Salon 07/09/17; the patient  is followed up with Dr.  Megan Salon. 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 She continues on Levaquin and metronidazole continuing both of these for now I think she is been on fr about 6 weeks. She still has some drainage but no pain. No fever. Her had been plans for her to go to the OR for operative debridement with her podiatrist [name], I am not exactly sure where that is. I'll probably slip a note to [name] today. I note that she follows with Dr. Dwyane Dee of endocrinology. We have her recertified for hyperbaric oxygen. I have not heard about Dermagraft however I'll see if [name] is planning a skin substitute as well 07/16/17; the patient tells me she is just about out of [place]. I'll need to check Dr. Hale Bogus last notes on this. She states she has plenty of Flagyl however. She comes in today complaining of pain in the right lateral foot which she said lasted for about a day. The wound on the right foot is actually much more medially. She also tells me that the [facility] cost a lot of pain in the left foot wound and she turned back to silver alginate. Finally Dermagraft has a $144 per application co-pay. She cannot afford this 07/23/17; patient arrives today with the wound not much smaller. There is not much new to add. She has not heard from [name] all try to put in a call to them today. She was asking about Dermagraft again and she has an over $315 per application co-pay she states that she would be willing to try to do a payment plan. I been tried to avoid this. We've been using silver alginate, I'll change to [facility] 07/30/17-She is here in follow-up evaluation for left foot ulcer. She continues hyperbaric medicine. The left foot ulcer is stable we will continue with same treatment plan 08/06/17; she is here for evaluation of her left foot ulcer. Currently being treated for hyperbarics or underlying osteomyelitis. She is completed antibiotics. The  left foot ulcer is better smaller with healthier looking granulation. For various reasons I am not really clear on we never got her back to the OR with [name]. He did not respond to my secure text message. Nevertheless I think that surgery on this point is not necessary nor am I completely clear that a skin substitute is necessary The patient is complaining about pain on the outside of her right foot. 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 There is no erythema. She also states the foot is warm versus her other part of her upper leg and this is largely true. It is not totally clear to me what's causing this. She thinks it's different from her usual neuropathy pain 08/13/17; she arrives in clinic today with a small wound which is superficial on her right first metatarsal head. 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 She tells [place] she was up on her feet over the Mother's Day celebration. ooThe large wound is on the left foot. Continues with hyperbarics for underlying osteomyelitis. We're using Hydrofera Blue. She asked me today about where we were with Dermagraft. I had actually excluded this because of the co- pay however she wants to assume this therefore I'll recheck the co-pay an order for next week. 08/20/17; the patient agreed to accept the co-pay of the first Apligraf which we applied today. She is disappointed she is finishing hyperbarics will  run this through the insurance on the extent of the foot infection and the extent of the wound that she had however she is already had 60 dive's. Dermagraft No. 1 08/27/17; Dermagraft No. 2. She is not eligible for any more hyperbaric treatments this month. She reports a fair amount of drainage and she actually changed to the external dressings without disturbing the direct contact layer 09/03/17; the patient arrived in clinic today with the wound superficially looking quite healthy. Nice vibrant red tissue with some  advancing epithelialization although not as much adherence of the flap as I might like. However she noted on her own fourth toe some bogginess and she brought that to our attention. Indeed this was boggy feeling like a possibility of subcutaneous fluid. 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 She is not been unwell. We've been using Dermagraft 09/10/17; the culture that I did not last week was MRSA. She saw Dr. Megan Salon this morning who is going to start her on vancomycin. I had sent him a secure a text message yesterday. I also spoke with her podiatric surgeon [name] about surgery on this foot the options for conserving a functional foot etc. Promised me he would see her and will make back consultation today. Paradoxically her actual wound on the plantar aspect of her left foot looks really quite good. I had given her 5 days worth of Baxdella to cover her for MRSA. Her MRI came back showing osteomyelitis within the third metatarsal shaft and head and base of the third and fourth proximal phalanx. She had extensive inflammatory changes throughout the soft tissue of the lateral forefoot. With an ill-defined fluid around the fourth metatarsal extending into the plantar and dorsal soft tissues 09/19/17; the patient is actually on oral Septra and Flagyl. She apparently refused IV vancomycin. She also saw [name] at my request who is planning her for a left BKA sometime in mid July. MRI showed osteomyelitis within the third metatarsal shaft and head and the basis of the third and fourth proximal phalanx. I believe there was felt to be possible septic arthritis involving the third MTP. 09/26/17; the patient went back to Dr. Megan Salon at my suggestion and is now receiving IV daptomycin. Her wound continues to look quite good making the decision to proceed with a transmetatarsal amputation although more difficult for the patient. I  believe in my extensive discussions with her she has a good sense of the pros and cons of this. I don't NV the tuft decision she has to make. She has an appointment with [name] I believe in mid July and I previously spoken to him about this issue Has we had used 3 previous Dermagraft. Given the condition of the wound surface I went ahead and added the fourth one today area and I did this not fully realizing that she'll be traveling to [place] next week. I'm hopeful she can come back in 2 weeks 10/21/17; Her same Dermagraft on for about 3-1/2 weeks. In spite of this the wound arrives looking quite healthy. There is been a lot of healing dimensions are smaller. Looking at the square shaped wound she has now there is some undermining and some depth medially under the undermining although I cannot palpate any bone. No surrounding infection is obvious. She has difficult questions about how to look at this going forward vis--vis amputations versus continued medical therapy. To be  truthful the wound is looks so healthy and it is continued to contract. Hard to justify foot surgery at this point although I still told her that I think it might come to that if we are not able to eradicate the underlying MRSA. She is still highly at risk and she understands this 11/06/17 on evaluation today patient appears to be doing better in regard to her foot ulcer. She's been tolerating the dressing changes without complication. Currently she is here for her Dermagraft #6. Her wound continues to make excellent progress at this point. She does not appear to have any evidence of infection which is good news. 11/13/17 on evaluation today patient appears to be doing excellent at this time. She is here for repeat Dermagraft application. This is #7. Overall her wound seems to be making great progress. 12/05/17; the patient arrives with the wound in much better condition than when I last saw this almost 6 weeks ago. She still  has a small probing area in the left metatarsal head region on the lateral aspect of her foot. We applied her last Dermagraft today. ooSince the last time she is here she has what appears to a been a blood blister on the plantar aspect of left foot although I don't see this is threatening. There is also a thick raised tissue on the right mid metatarsal head region. This was not there I don't think the last time she was here 3 weeks ago. 12/12/17; the patient continues to have a small programming area in the left metatarsal head region on the lateral aspect of her foot which was the initial large surgical wound. I applied her last Apligraf last week. I'm going to use Endoform starting today ooUnfortunately she has an excoriated area in the left mid foot and the right mid foot. The left midfoot looks like a blistered area this was not opened last week it certainly is open today. Using silver alginate on these areas. She promises me she is offloading this. 12/19/17; the small probing area in the left metatarsal head eyes think is shallower. In general her original wound looks better. We've been using Endoform. The area inferiorly that I think was trauma last week still requires debridement a lot of nonviable surface which I removed. She still has an open open area distally in her foot ooSimilarly on the right foot there is tightly adherent surface debris which I removed. Still areas that don't look completely epithelialized. This is a small open area. We used silver alginate on these areas 12/26/2017; the patient did not have the supplies we ordered from last week including the Endoform. The original large wound on the left lateral foot looks healthy. She still has the undermining area that is largely unchanged from last week. She has the same heavily callused raised edged wounds on the right mid and left midfoot. Both of these requiring debridement. We have been using silver alginate on these  areas 01/02/2018; there is still supply issues. We are going to try to use Prisma but I am not sure she actually got it from what she is saying. She has a new open area on the lateral aspect of the left fourth toe [previous fifth ray amputation]. Still the one tunneling area over the fourth metatarsal head. The area is in the midfoot bilaterally still have thick callus around them. She is concerned about a raised swelling on the lateral aspect of the foot. However she is completely insensate 01/10/2018; we are using Prisma to  the wounds on her bilateral feet. Surprisingly the tunneling area over the left fourth metatarsal head that was part of her original surgery has closed down. She has a small open area remaining on the incision line. 2 open areas in the midfoot. 02/10/2018; the patient arrives back in clinic after a month hiatus. She was traveling to visit family in [place]. Is fairly clear she was not offloading the areas on her feet. The original wound over the left lateral foot at the level of metatarsal heads is reopened and probes medially by about a centimeter or 2. She notes that a week ago she had purulent drainage come out of an area on the left midfoot. Paradoxically the worst area is actually on the right foot is extensive with purulent drainage. We will use silver alginate today 02/17/2018; the patient has 3 wounds one over the left lateral foot. She still has a small area over the metatarsal heads which is the remnant of her original surgical wound. This has medial probing depth of roughly 1.4 cm somewhat better than last week. The area on the right foot is larger. We have been using silver alginate to all areas. The area on the right foot and left foot that we cultured last week showed both Klebsiella and Proteus. Both of these are quinolone sensitive. The patient put her's self on Bactrim and Flagyl that she had left hanging around from prior antibiotic usages. She was  apparently on this last week when she arrived. I did not realize this. Unfortunately the Bactrim will not cover either 1 of these organisms. We will send in Cipro 500 twice daily for a week 03/04/2018; the patient has 2 wounds on the left foot one is the original wound which was a surgical wound for a deep DFU. At one point this had exposed bone. She still has an area over the fourth metatarsal head that probes about 1.4 cm although I think this is better than last week. I been using silver nitrate to try and promote tissue adherence and been using silver alginate here. ooShe also has an area in the left midfoot. This has some depth but a small linear wound. Still requiring debridement. ooOn the right midfoot is a circular wound. A lot of thick callus around this area. ooWe have been using silver alginate to all wound areas ooShe is completed the ciprofloxacin I gave her 2 weeks ago. 03/11/2018; the patient continues to have 2 open areas on the left foot 1 of which was the original surgical wound for a deep DFU. Only a small probing area remains although this is not much different from last week we have been using silver alginate. The other area is on the midfoot this is smaller linear but still with some depth. We have been using silver alginate here as well ooOn the right foot she has a small circular wound in the mid aspect. This is not much smaller than last time. We have been using silver alginate here as well 03/18/2018; she has 3 wounds on the left foot the original surgical wound, a very superficial wound in the mid aspect and then finally the area in the mid plantar foot. She arrives in today with a very concerning area in the wound in the mid plantar foot which is her most proximal wound. There is undermining here of roughly 1-1/2 cm superiorly. Serosanguineous drainage. She tells me she had some pain on for over the weekend that shot up her foot into her thigh  and she tells me that  she had a nodule in the groin area. ooShe has the single wound in the right foot. ooWe are using endoform to both wound areas 03/24/2018; the patient arrives with the original surgical wound in the area on the left midfoot about the same as last week. There is a collection of fluid under the surface of the skin extending from the surgical wound towards the midfoot although it does not reach the midfoot wound. The area on the right foot is about the same. Cultures from last week of the left midfoot wound showed abundant Klebsiella abundant Enterococcus faecalis and moderate methicillin resistant staph I gave her Levaquin but this would have only covered the Klebsiella. She will need linezolid 04/01/2018; she is taking linezolid but for the first few days only took 1 a day. I have advised her to finish this at twice daily dosing. In any case all of her wounds are a lot better especially on the left foot. The original surgical wound is closed. The area on the left midfoot considerably smaller. The area on the right foot also smaller. 04/08/2018; her original surgical wound/osteomyelitis on the left foot remains closed. She has area on the left foot that is in the midfoot area but she had some streaking towards this. This is not connected with her original wound at least not visually. ooSmall wound on the right midfoot appears somewhat smaller. 04/15/18; both wounds looks better. Original wound is better left midfoot. Using silver alginate 1/21; patient states she uses saltwater soak in, stones or remove callus from around her wounds. She is also concerned about a blood blister she had on the left foot but it simply resolved on its own. We've been using silver alginate 1/28; the patient arrives today with the same streaking area from her metatarsals laterally [the site of her original surgical wound] down to the middle of her foot. There is some drainage in the subcutaneous area here. This concerns  me that there is actually continued ongoing infection in the metatarsals probably the fourth and third. This fixates an MRI of the foot without contrast [chronic renal failure] ooThe wound in the mid part of the foot is small but I wonder whether this area actually connects with the more distal foot. ooThe area on the right midfoot is probably about the same. Callus thick skin around the small wound which I removed with a curette we have been using silver alginate on both wound areas 2/4; culture I did of the draining site on the left foot last time grew methicillin sensitive staph aureus. MRI of the left foot showed interval resolution of the findings surrounding the third metatarsal joint on the prior study consistent with treated osteomyelitis. Chronic soft tissue ulceration in the plantar and lateral aspect of the forefoot without residual focal fluid collection. No evidence of recurrent osteomyelitis. 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 She tells me she had nausea and vomiting over the weekend and missed 2 days. In general her foot looks much the same. She has a small open area just below the left fourth metatarsal head. A linear area in the left midfoot. Some discoloration extending from the inferior part of this into the left lateral foot although this  appears to be superficial. She has a small area on the right midfoot which generally looks smaller after debridement 2/18; the patient is completing his cephalexin and has another 2 days. She continues to have open areas on the left and right foot. 2/25; she is now off antibiotics. The area on the left foot at the site of her original surgical wound has closed yet again. She still has open  areas in the mid part of her foot however these appear smaller. The area on the right mid foot looks about the same. We have been using silver alginate She tells me she had a serious hypoglycemic spell at home. She had to have EMS called and get IV dextrose 3/3; disappointing on the left lateral foot large area of necrotic tissue surrounding the linear area. This appears to track up towards the same original surgical wound. Required extensive debridement. The area on the right plantar foot is not a lot better also using silver 3/12; the culture I did last time showed abundant enterococcus. I have prescribed Augmentin, should cover any unrecognized anaerobes as well. In addition there were a few MRSA and Serratia that would not be well covered although I did not want to give her multiple antibiotics. She comes in today with a new wound in the right midfoot this is not connected with the original wound over her MTP a lot of thick callus tissue around both wounds but once again she said she is not walking on these areas 3/17-Patient comes in for follow-up on the bilateral plantar wounds, the right midfoot and the left plantar wound. Both these are heavily callused surrounding the wounds. We are continuing to use silver alginate, she is compliant with offloading and states she uses a wheelchair fairly often at home 3/24; both wound areas have thick callus. However things actually look quite a bit better here for the majority of her left foot and the right foot. 3/31; patient continues to have thick callused somewhat irritated looking tissue around the wounds which individually are fairly superficial. There is no evidence of surrounding infection. We have been using silver alginate however I change that to [facility] today 4/17; patient returns to clinic after having a scare with Covid she tested negative in her primary doctor's office. She has been using Hydrofera Blue. She does not have an open  area on the right foot. On the left foot she has a small open area with the mid area not completely viable. She showed me pictures of what looks like a hemorrhagic blister from several days ago but that seems to have healed over this was on the lateral left foot 4/21; patient comes in to clinic with both her wounds on her feet closed. However over the weekend she started having pain in her right foot and leg up into the thigh. She felt as though she was running a low-grade fever but did not take her temperature. She took a doxycycline that she had leftover and yesterday a single Septra and metronidazole. She thinks things feel somewhat better. 4/28; duplex ultrasound I ordered last week was negative for DVT or superficial thrombophlebitis. She is completed the doxycycline I gave her. States she is still having a lot of pain in the right calf and right ankle which is no better than last week. She cannot sleep. She also states she has a temperature of up to 101, coughing and complaining of visual loss in her bilateral eyes. Apparently she was tested for Covid 2  weeks ago at [facility] and that was negative. Readmission: 09/03/18 patient presents back for reevaluation after having been evaluated at the end of April regarding erythema and swelling of her right lower extremity. Subsequently she ended up going to the hospital on 07/29/18 and was admitted not to be discharged until 08/08/18. Unfortunately it was noted during the time that she was in the hospital that she did have methicillin-resistant Staphylococcus aureus as the infection noted at the site. It was also determined that she did have osteomyelitis which appears to be fairly significant. She was treated with vancomycin and in fact is still on IV vancomycin at dialysis currently. This is actually slated to continue until 09/12/18 at least which will be the completion of the six weeks of therapy. Nonetheless based on what I'm seeing at this point I'm  not sure she will be anywhere near ready to discontinue antibiotics at that time. Since she was released from the hospital she was seen by [name] who is her podiatrist on 08/27/18. His note specifically states that he is recommended that the patient needs of one knee amputation on the right as she has a life-threatening situation that can lead quickly to sepsis. The patient advised she would like to try to save her leg to which [name] apparently told her that this was against all medical advice. She also want to discontinue the Wound VAC which had been initiated due to the fact that she wasn't pleased with how the wound was looking and subsequently she wanted to pursue applying Medihoney at that time. He stated that he did not believe that the right lower extremity was salvageable and that the patient understood but would still like to attempt hyperbaric option therapy if it could be of any benefit. She was therefore referred back to [place] for further evaluation. He plans to see her back next week. Upon inspection today patient has a significant amount purulent drainage noted from the wound at this point. The bone in the distal portion of her foot also appears to be extremely necrotic and spongy. When I push down on the bone it bubbles and seeps purulent drainage from deeper in the end of the foot. I do not think that this is likely going to heal very well at all and less aggressive surgical debridement were undertaken more than what I believe we can likely do here in our office. 09/12/2018; I have not seen this patient since the most recent hospitalization although she was in our clinic last week. I have reviewed some of her records from a complex hospitalization. 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 There is situation was complicated by MRSA bacteremia and acute on chronic renal failure now on dialysis. She is receiving vancomycin at dialysis.  We started her on Dakin's wet-to- dry last week she is changing this daily. There is still purulent drainage coming out of her foot. Although she is apparently "agreeable" to a below-knee amputation which is been suggested by multiple clinicians she wants this to be done in [place]. She apparently has a telehealth visit with that provider sometime in late [place] 6/24. I have told her I think this is probably too long. Nevertheless I could not convince her to allow a local doctor to perform BKA. 09/19/2018; the patient has a large necrotic area on the right anterior foot. She has had previous transmetatarsal amputations. Culture I did last week showed MRSA nothing else she is on  vancomycin at dialysis. She has continued leaking purulent drainage out of the distal part of the large circular wound on the right anterior foot. She apparently went to see Dr. 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 Somehow that translated into her being referred to plastic surgery for debridement of the area. I gather she basically refused amputation although I do not have a copy of Dr. Berenice Primas notes. The patient really wants to have a trial of hyperbaric oxygen. I agreed with initial assessment in this clinic that this was probably too far along to benefit however if she is going to have plastic surgery I think she would benefit from ancillary hyperbaric oxygen. The issue here is that the patient has benefited as maximally as any patient I have ever seen from hyperbaric oxygen therapy. Most recently she had exposed bone on the lateral part of her left foot after a surgical procedure and that actually has closed. She has eschared areas in both heels but no open area. She is remained systemically well. I am not optimistic that anything can be done about this but the patient is very clear that she wants an attempt. The attempt would include a wound VAC further debridements and hyperbaric  oxygen along with IV antibiotics. 6/26; I put her in for a trial of hyperbaric oxygen only because of the dramatic response she has had with wounds on her left midfoot earlier this year which was a surgical wound that went straight to her bone over the metatarsal heads and also remotely the left third toe. We will see if we can get this through our review process and insurance. She arrives in clinic with again purulent material pouring out of necrotic bone on the top of the foot distally. There is also some concerning erythema on the front of the leg that we marked. It is bit difficult to tell how tender this is because of neuropathy. I note from infectious disease that she had her vancomycin extended. 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 She had the wound VAC on for part of the week. The rest of the time she is putting various things on this including Medihoney, "ionized water" silver sorb gel etc. 7/7; follow-up along with HBO. She is still on vancomycin at dialysis. She has a large open area on the dorsal right foot and a small dark eschar area on her heel. There is a lot less erythema in the area and a lot less tenderness. From an infection point of view I think this is better. She still has a lot of necrosis in the remaining right forefoot [previous TMA] we are still using the wound VAC in this area 7/16; follow-up along with HBO. I put her on linezolid after she finished her vancomycin. We started this last Friday I gave her 2 weeks worth. I had the expectation that she would be operatively debrided by Dr. Marla Roe but that still has not happened yet. Patient phoned the office this week. She arrives for review today after HBO. The distal part of this wound is completely necrotic. Nonviable pieces of tendon bone was still purulent drainage. Also concerning that she has black eschar over the heel that is expanding. I think this may be indicative of  infection in this area as well. She has less erythema and warmth in the ankle and calf but still an abnormal exam 7/21 follow-up along with HBO. I will renew her linezolid after checking a CBC  with differential monitoring her blood counts especially her platelets. She was supposed to have surgery yesterday but if I am reading things correctly this was canceled after her blood sugar was found to be over 500. I thought Dr. Marla Roe who called me said that they were sending her to the ER but the patient states that was not the case. 7/28. Follow-up along with HBO. She is on linezolid I still do not have any lab work from dialysis even though I called last week. The patient is concerned about an area on her left lateral foot about the level of the base of her fifth metatarsal. I did not really see anything that ominous here however this patient is in [place] ability to point out problems that she is sensing and she has been accurate in the past Finally she received a call from Dr. Marla Roe who is referring her to another orthopedic surgeon stating that she is too booked up to take her to the operating room now. Was still using a wound VAC on the foot 8/3 -Follow-up after HBO, she is got another week of linezolid, she is to call ID for an appointment, x-rays of both feet were reviewed, the left foot x-ray with third MTP joint osteo- Right foot x-ray widespread osteo-in the right midfoot Right ankle x-ray does not show any active evidence of infection 8/11-Patient is seen after HBO, the wounds on the right foot appear to be about the same, the heel wound had some necrotic base over tendon that was debrided with a curette 8/21; patient is seen after HBO. The patient's wound on her dorsal foot actually looks reasonably good and there is substantial amount of epithelialization however the open area distally still has a lot of necrotic debris partially bone. I cannot really get a good sense of just how  deep this probes under the foot. She has been pressuring me this week to order medical maggots through a company in [place] for her. The problem I have is there is not a defined wound area here. On the positive side there is no purulence. She has been to see infectious disease she is still on Septra DS although I have not had a chance to review their notes 8/28; patient is seen in conjunction with HBO. The wounds on her foot continued to improve including the right dorsal foot substantially the, the distal part of this wound and the area on the right heel. We have been using a wound VAC over this chronically. She is still on trimethoprim as directed by infectious disease 9/4; patient is seen in conjunction with HBO. Right dorsal foot wound substantially anteriorly is better however she continues to have a deep wound in the distal part of this that is not responding. We have been using silver collagen under border foam ooArea on the right plantar medial heel seems better. We have been using Hydrofera Blue 12/12/18 on evaluation today patient appears to be doing about the same with regard to her wound based on prior measurements. She does have some necrotic tissue noted on the lateral aspect of the wound that is going require a little bit of sharp debridement today. This includes what appears to be potentially either severely necrotic bone or tendon. Nonetheless other than that she does not appear to have any severe infection which is good news 9/18; it is been 2 weeks since I saw this wound. She is tolerating HBO well. Continued dramatic improvement in the area on the right dorsal foot.  She still has a small wound on the heel that we have been using Hydrofera Blue. She continues with a wound VAC 9/24; patient has to be seen emergently today with a swelling on her right lateral lower leg. She says that she told Dr. Evette Doffing about this and also myself on a couple of occasions but I really have no  recollection of this. She is not systemically unwell and her wound really looked good the last time I saw this. She showed this to providers at dialysis and she was able to verify that she was started on cephalexin today for 5 doses at dialysis. She dialyzes on Tuesday Thursday and Saturday. 10/2; patient is seen in conjunction with HBO. The area that is draining on the right anterior medial tibia is more extensive. Copious amounts of serosanguineous drainage with some purulence. We are still using the wound VAC on the original wound then it is stable. Culture I did of the original IandD showed MRSA I contacted dialysis she is now on vancomycin with dialysis treatments. I asked them to run a month Objective Constitutional Temperature is normal and within the target range for the patient.. Vitals Time Taken: 4:00 PM, Height: 67 in, Weight: 125 lbs, BMI: 19.6. General Notes: see HBO notes General Notes: Wound exam; original wound looks about the same. The debris on the lower part of the wound is still present but not particularly worse. Tissue looks fairly good. ooThe real problem is the noticeable swelling and drainage in the lower part of the leg. Serosanguineous drainage with pressure some purulence is obvious. Using a #15 blade I asked distended the original incision I made last week in order for this to drain more noticeably. Integumentary (Hair, Skin) Wound #43 status is Open. Original cause of wound was Gradually Appeared. The wound is located on the Right,Medial Foot. The wound measures 5.8cm length x 4.5cm width x 1.9cm depth; 20.499cm^2 area and 38.948cm^3 volume. There is muscle, tendon, and Fat Layer (Subcutaneous Tissue) Exposed exposed. There is no tunneling or undermining noted. There is a medium amount of serosanguineous drainage noted. The wound margin is distinct with the outline attached to the wound base. There is large (67-100%) red, pink granulation within the wound bed.  There is a small (1-33%) amount of necrotic tissue within the wound bed including Adherent Slough and Necrosis of Muscle. Wound #44 status is Open. Original cause of wound was Gradually Appeared. The wound is located on the Right Calcaneus. The wound measures 1.1cm length x 0.8cm width x 0.1cm depth; 0.691cm^2 area and 0.069cm^3 volume. There is Fat Layer (Subcutaneous Tissue) Exposed exposed. There is no tunneling or undermining noted. There is a small amount of serosanguineous drainage noted. The wound margin is flat and intact. There is large (67- 100%) red granulation within the wound bed. There is no necrotic tissue within the wound bed. Wound #46 status is Open. Original cause of wound was Other Lesion. The wound is located on the Right,Lateral Lower Leg. The wound measures 0.3cm length x 0.4cm width x 1cm depth; 0.094cm^2 area and 0.094cm^3 volume. There is Fat Layer (Subcutaneous Tissue) Exposed exposed. Tunneling has been noted at 3:00 with a maximum distance of 3.7cm. Undermining begins at 12:00 and ends at 12:00 with a maximum distance of 2.2cm. There is a medium amount of purulent drainage noted. The wound margin is distinct with the outline attached to the wound base. There is no granulation within the wound bed. There is no necrotic tissue within the wound  bed. 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 Abscess incision and drainage was provided by Ricard Dillon., MD. The skin was cleansed and prepped with anti-septic followed by pain control using Lidocaine Injectable. An incision was made with the following instrument(s): Blade. There was an immediate release of  Purulent and Sero-Sanguineous fluid. A Minimum amount of bleeding was controlled with Pressure. A time out was conducted at 16:30, prior to the start of the procedure. No culture was sent. The procedure was tolerated well with a pain level of 3 throughout and a pain level of 1 following the procedure. Post procedure Diagnosis Wound #46: Same as Pre-Procedure Plan Follow-up Appointments: Return Appointment in 1 week. Dressing Change Frequency: Wound #43 Right,Medial Foot: Change dressing three times week. Wound #44 Right Calcaneus: Change dressing three times week. Wound #46 Right,Lateral Lower Leg: Change dressing three times week. Wound Cleansing: Wound #43 Right,Medial Foot: Clean wound with Wound Cleanser - anasept Wound #44 Right Calcaneus: Clean wound with Wound Cleanser - anasept Wound #46 Right,Lateral Lower Leg: Clean wound with Wound Cleanser - anasept Primary Wound Dressing: Wound #43 Right,Medial Foot: Santyl Ointment - under black foam Other: - silver alginate in clinic. Resume wound vac at home. Wound #44 Right Calcaneus: Hydrofera Blue Wound #46 Right,Lateral Lower Leg: Calcium Alginate with Silver - cover wound. do not pack. Secondary Dressing: Wound #43 Right,Medial Foot: Kerlix/Rolled Gauze ABD pad Wound #44 Right Calcaneus: Kerlix/Rolled Gauze Dry Gauze Heel Cup Wound #46 Right,Lateral Lower Leg: Kerlix/Rolled Gauze ABD pad Negative Presssure Wound Therapy: Wound #43 Right,Medial Foot: Wound Vac to wound continuously at 176mm/hg pressure - apply silver alginate at wound clinic , home health to apply wound vac Black Foam Edema Control: Elevate legs to the level of the heart or above for 30 minutes daily and/or when sitting, a frequency of: - throughout the day Home Health: [facility] for wound care. - Interim The following medication(s) was prescribed: lidocaine HCl injection 10 mg/mL (1 %) solution 1 1 ml  solution injection for prior to procedure was prescribed at facility at 16:30 1. I extended the original IandD incision so this may drain a little more freely. 2. She probably needs an MRI of her lower leg although I am almost fearful of what this could is going to show. I did talk to her and she stated that if there was unrelenting infection in the leg she would consider a below-knee amputation. I will talk to her again next week. 3. Her original wound is stable still with a wound VAC on this 4. She is on vancomycin at dialysis. Culture of the draining area grew MRSA Electronic Signature(s) Signed: 01/02/2019 7:05:19 PM By: Baruch Gouty RN, BSN Signed: 01/05/2019 6:11:18 PM By: Linton Ham MD Previous Signature: 01/02/2019 4:55:01 PM Version By: Linton Ham MD Entered By: Baruch Gouty on 01/02/2019 18:31:02 -------------------------------------------------------------------------------- SuperBill Details Patient Name: Date of Service: [name] 01/02/2019 Medical Record Number:[id number] Patient Account Number: [id number] Date of Birth/Sex: Treating RN: 31-Mar-1971 (48 y.o. [name] Primary Care Provider: [name] Other Clinician: Referring Provider: Treating Provider/Extender:[name], [name], [name] in Treatment: 17 Diagnosis Coding ICD-10 Codes Code Description [phone] Other chronic osteomyelitis, right ankle and  foot L97.514 Non-pressure chronic ulcer of other part of right foot with necrosis of bone E10.621 Type 1 diabetes mellitus with foot ulcer L02.415 Cutaneous abscess of right lower limb Facility Procedures CPT4 Code: [id number] Description: 33295 - I and D Abscess; simple/single ICD-10 Diagnosis Description L02.415 Cutaneous abscess of right lower limb Modifier: Quantity: 1 Physician Procedures CPT4 Code: 1884166 Description: 06301 - I and D Abscess; simple/single 1 ICD-10 Diagnosis Description L02.415 Cutaneous abscess  of right lower limb Modifier: Quantity: Electronic Signature(s) Signed: 01/02/2019 6:19:43 PM By: Linton Ham MD Entered By: Linton Ham on 01/02/2019 60:10:93

## 2019-01-02 NOTE — Progress Notes (Addendum)
ADI, SEALES ([id number]) Visit Report for 01/02/2019 HBO Details Patient Name: Date of Service: [name], [name] 01/02/2019 1:15 PM Medical Record Number:[id number] Patient Account Number: [id number] Date of Birth/Sex: Treating RN: [birth year] (48 y.o. [name], [name] Primary Care [name]: [name] Other Clinician: Referring [name]: Treating [name]/Extender:[name], [name], [name] Weeks in Treatment: 17 HBO Treatment Course Details Treatment Course Number: 4 Ordering [name]: [name] Total Treatments Ordered: 50 HBO Treatment Start Date: 09/30/2018 HBO Indication: Diabetic Ulcer(s) of the Lower Extremity HBO Treatment Details Treatment Number: 59 Patient Type: Outpatient Chamber Type: Monoplace Chamber Serial #: [id number] Treatment Protocol: 2.5 ATA with 90 minutes oxygen, with two 5 minute air breaks Treatment Details Compression Rate Down: 2.0 psi / minute De-Compression Rate Up: 2.0 psi / minute Air breaks and CompressTx Pressure breathing periods DecompressDecompress Begins Reached (leave unused spaces Begins Ends blank) Chamber Pressure (ATA)1 2.5 2.5 2.5 2.5 2.5 --2.5 1 Clock Time (24 hr) 13:24 13:36 [id number]:3976:73--41:93 15:28 Treatment Length: 124 (minutes) Treatment Segments: 4 Vital Signs Capillary Blood Glucose Reference Range: 80 - 120 mg / dl HBO Diabetic Blood Glucose Intervention Range: <131 mg/dl or >249 mg/dl Time Vitals Blood Respiratory Capillary Blood Glucose Pulse Action Type: Pulse: Temperature: Taken: Pressure: Rate: Glucose (mg/dl): Meter #: Oximetry (%) Taken: Pre 13:15 164/78 97 17 97.6 314 Post 15:30 182/92 86 15 98 250 Treatment Response Treatment Toleration: Well Treatment Completion Treatment Completed without Adverse Event Status: Additional Procedure Documentation Tissue Sevierity: Necrosis of bone [name] Notes No concerns with treatment given. Patient was also seen for wound care  eval Physician HBO Attestation: I certify that I supervised this HBO treatment in accordance with Medicare guidelines. A trained Yes emergency response team is readily available per hospital policies and procedures. Continue HBOT as ordered. Yes Electronic Signature(s) Signed: 01/02/2019 6:19:43 PM By: Nancy Ham MD Previous Signature: 01/02/2019 4:13:18 PM Version By: [name] EMT/HBOT Entered By: [name] on 01/02/2019 18:16:11 -------------------------------------------------------------------------------- HBO Safety Checklist Details Patient Name: Date of Service: [name], Height 01/02/2019 1:15 PM Medical Record Number:[id number] Patient Account Number: [id number] Date of Birth/Sex: Treating RN: [birth year] (48 y.o. [name], [name] Primary Care [name]: [name] Other Clinician: Referring [name]: Treating [name]/Extender:[name], [name], [name] Weeks in Treatment: 17 HBO Safety Checklist Items Safety Checklist Consent Form Signed Patient voided / foley secured and emptied When did you last eato 1200 - yogurt Last dose of injectable or oral agent insulin pump NA Ostomy pouch emptied and vented if applicable NA All implantable devices assessed, documented and approved NA Intravenous access site secured and place Valuables secured Linens and cotton and cotton/polyester blend (less than 51% polyester) Personal oil-based products / skin lotions / body lotions removed NA Wigs or hairpieces removed NA Smoking or tobacco materials removed Books / newspapers / magazines / loose paper removed Cologne, aftershave, perfume and deodorant removed Jewelry removed (may wrap wedding band) Make-up removed Hair care products removed Battery operated devices (external) removed NA Heating patches and chemical warmers removed NA Titanium eyewear removed NA Nail polish cured greater than 10 hours NA Casting material cured greater than 10 hours NA  Hearing aids removed NA Loose dentures or partials removed NA Prosthetics have been removed Patient demonstrates correct use of air break device (if applicable) Patient concerns have been addressed Patient grounding bracelet on and cord attached to chamber Specifics for Inpatients (complete in addition to above) Medication sheet sent with patient Intravenous medications needed or due during therapy sent with patient Drainage tubes (e.g. nasogastric tube  or chest tube secured and vented) Endotracheal or Tracheotomy tube secured Cuff deflated of air and inflated with saline Airway suctioned Electronic Signature(s) Signed: 01/02/2019 1:34:46 PM By: [name] EMT/HBOT Entered By: [name] on 01/02/2019 13:34:46

## 2019-01-02 NOTE — Progress Notes (Signed)
CONSTANCIA, GEETING ([id number]) Visit Report for 01/02/2019 Arrival Information Details Patient Name: Date of Service: Marysol, [place] 01/02/2019 1:15 PM Medical Record Number:[id number] Patient Account Number: [id number] Date of Birth/Sex: Treating RN: 1970-04-16 (48 y.o. [name], [name] Primary Care Maikayla Beggs: [name] Other Clinician: Referring Georgie Eduardo: Treating Arron Mcnaught/Extender:[name], [name], [name] in Treatment: 25 Visit Information History Since Last Visit Added or deleted any medications: No Patient Arrived: Wheel Chair Any new allergies or adverse reactions: No Arrival Time: 13:13 Had a fall or experienced change in No activities of daily living that may affect Accompanied By: self risk of falls: Transfer Assistance: None Signs or symptoms of abuse/neglect since last No Patient Identification Verified: Yes visito Secondary Verification Process Completed: Yes Hospitalized since last visit: No Patient Requires Transmission-Based No Implantable device outside of the clinic excluding No Precautions: cellular tissue based products placed in the center Patient Has Alerts: No since last visit: Pain Present Now: No Electronic Signature(s) Signed: 01/02/2019 4:13:18 PM By: [name] EMT/HBOT Entered By: [name] on 01/02/2019 13:33:35 -------------------------------------------------------------------------------- Encounter Discharge Information Details Patient Name: Date of Service: [name] 01/02/2019 1:15 PM Medical Record Number:[id number] Patient Account Number: [id number] Date of Birth/Sex: Treating RN: 12/24/1970 (48 y.o. [name] Primary Care Amaziah Raisanen: [name] Other Clinician: Referring Ancil Dewan: Treating Evangelina Delancey/Extender:[name], [name], [name] in Treatment: 17 Encounter Discharge Information Items Discharge Condition: Stable Ambulatory Status: Wheelchair Discharge Destination:  Home Transportation: Private Auto Accompanied By: self Schedule Follow-up Appointment: Yes Clinical Summary of Care: Patient Declined Electronic Signature(s) Signed: 01/02/2019 4:13:18 PM By: [name] EMT/HBOT Entered By: [name] on 01/02/2019 16:13:03 Patient/Caregiver Education Details Date of Service: -------------------------------------------------------------------------------- Margorie John 10/2/2020andnbsp1:15 PM Patient Name: L. Patient Account Number: [id number] Medical Record Treating RN: Baruch Gouty [id number] Number: Other Clinician: Date of Birth/Gender: 09-Nov-1970 (48 y.o. F) Treating Linton Ham Primary Care Physician: [name] Physician/Extender: Referring Physician: Juanito Doom in Treatment: 17 Education Assessment Education Provided To: Patient Education Topics Provided Hyperbaric Oxygenation: Methods: Explain/Verbal Responses: State content correctly Electronic Signature(s) Signed: 01/02/2019 4:13:18 PM By: [name] EMT/HBOT Entered By: [name] on 01/02/2019 16:12:51 -------------------------------------------------------------------------------- Vitals Details Patient Name: Date of Service: [name] 01/02/2019 1:15 PM Medical Record Number:[id number] Patient Account Number: [id number] Date of Birth/Sex: Treating RN: May 05, 1970 (48 y.o. [name] Primary Care Zoeann Mol: [name] Other Clinician: Referring Joniya Boberg: Treating Cullin Dishman/Extender:[name], [name], [name] in Treatment: 17 Vital Signs Time Taken: 13:15 Temperature (F): 97.6 Height (in): 67 Pulse (bpm): 97 Weight (lbs): 125 Respiratory Rate (breaths/min): 17 Body Mass Index (BMI): 19.6 Blood Pressure (mmHg): 164/78 Capillary Blood Glucose (mg/dl): 314 Reference Range: 80 - 120 mg / dl Electronic Signature(s) Signed: 01/02/2019 4:13:18 PM By: [name] EMT/HBOT Entered By: [name] on  01/02/2019 13:34:07

## 2019-01-02 NOTE — Progress Notes (Signed)
[name] ([id number]) Visit Report for 01/02/2019 Arrival Information Details Patient Name: Date of Service: [name] 01/02/2019 12:30 PM Medical Record Number:[id number] Patient Account Number: [id number] Date of Birth/Sex: Treating RN: 1970-04-13 (48 y.o. [name], [name] Primary Care Zamaya Rapaport: [name] Other Clinician: Referring Filiberto Wamble: Treating [name]/Extender:[name], [name], [name] in Treatment: 35 Visit Information History Since Last Visit Added or deleted any medications: No Patient Arrived: Wheel Chair Any new allergies or adverse reactions: No Arrival Time: 18:20 Had a fall or experienced change in No activities of daily living that may affect Accompanied By: self risk of falls: Transfer Assistance: None Signs or symptoms of abuse/neglect since last No Patient Requires Transmission-Based No visito Precautions: Hospitalized since last visit: No Patient Has Alerts: No Implantable device outside of the clinic excluding No cellular tissue based products placed in the center since last visit: Has Dressing in Place as Prescribed: Yes Pain Present Now: No Electronic Signature(s) Signed: 01/02/2019 7:05:19 PM By: Baruch Gouty RN, BSN Entered By: Baruch Gouty on 01/02/2019 18:21:13 -------------------------------------------------------------------------------- Encounter Discharge Information Details Patient Name: Date of Service: [name] 01/02/2019 12:30 PM Medical Record Number:[id number] Patient Account Number: [id number] Date of Birth/Sex: Treating RN: 1971-04-01 (48 y.o. [name] Primary Care Lorella Gomez: [name] Other Clinician: Referring Mahari Vankirk: Treating Cincere Deprey/Extender:[name], [name], [name] in Treatment: 17 Encounter Discharge Information Items Post Procedure Vitals Discharge Condition: Stable Temperature (F): 97.6 Ambulatory Status: Wheelchair Pulse (bpm):  97 Discharge Destination: Home Respiratory Rate (breaths/min): 18 Transportation: Private Auto Blood Pressure (mmHg): 164/78 Accompanied By: self Schedule Follow-up Appointment: Yes Clinical Summary of Care: Patient Declined Electronic Signature(s) Signed: 01/02/2019 7:05:19 PM By: Baruch Gouty RN, BSN Entered By: Baruch Gouty on 01/02/2019 18:33:51 -------------------------------------------------------------------------------- Lower Extremity Assessment Details Patient Name: Date of Service: [name] 01/02/2019 12:30 PM Medical Record Number:[id number] Patient Account Number: [id number] Date of Birth/Sex: Treating RN: 11/29/1970 (48 y.o. [name] Primary Care Tyjai Charbonnet: [name] Other Clinician: Referring Elfrida Pixley: Treating Venesha Petraitis/Extender:[name], [name], [name] in Treatment: 17 Edema Assessment Assessed: [Left: No] [Right: No] Edema: [Left: Ye] [Right: s] Calf Left: Right: Point of Measurement: cm From Medial Instep cm 28 cm Ankle Left: Right: Point of Measurement: cm From Medial Instep cm 21 cm Electronic Signature(s) Signed: 01/02/2019 7:05:19 PM By: Baruch Gouty RN, BSN Entered By: Baruch Gouty on 01/02/2019 18:21:58 -------------------------------------------------------------------------------- [place] Details Patient Name: Date of Service: [name] 01/02/2019 12:30 PM Medical Record Number:[id number] Patient Account Number: [id number] Date of Birth/Sex: Treating RN: 1970-07-30 (48 y.o. [name], [name] Primary Care Capone Schwinn: [name] Other Clinician: Referring Aahna Rossa: Treating Jamire Shabazz/Extender:[name], [name], [name] in Treatment: 17 Active Inactive HBO Nursing Diagnoses: Potential for barotraumas to ears, sinuses, teeth, and lungs or cerebral gas embolism related to changes in atmospheric pressure inside hyperbaric oxygen chamber Potential for oxygen  toxicity seizures related to delivery of 100% oxygen at an increased atmospheric pressure Potential for pulmonary oxygen toxicity related to delivery of 100% oxygen at an increased atmospheric pressure Goals: Barotrauma will be prevented during HBO2 Date Initiated: 11/28/2018 Target Resolution Date: 01/30/2019 Goal Status: Active Patient will tolerate the hyperbaric oxygen therapy treatment Date Initiated: 11/28/2018 Target Resolution Date: 01/30/2019 Goal Status: Active Interventions: Administer decongestants, per physician orders, prior to HBO2 Administer the correct therapeutic gas delivery based on the patients needs and limitations, per physician order Assess and provide for patients comfort related to the hyperbaric environment and equalization of middle ear Assess for signs and symptoms related to  adverse events, including but not limited to confinement anxiety, pneumothorax, oxygen toxicity and baurotrauma Assess patient for any history of confinement anxiety Assess patient's knowledge and expectations regarding hyperbaric medicine and provide education related to the hyperbaric environment, goals of treatment and prevention of adverse events Implement protocols to decrease risk of pneumothorax in high risk patients Notes: Wound/Skin Impairment Nursing Diagnoses: Impaired tissue integrity Knowledge deficit related to ulceration/compromised skin integrity Goals: Patient/caregiver will verbalize understanding of skin care regimen Date Initiated: 09/03/2018 Target Resolution Date: 02/27/2019 Goal Status: Active Ulcer/skin breakdown will have a volume reduction of 30% by week 4 Date Initiated: 09/03/2018 Date Inactivated: 10/07/2018 Target Resolution Date: 10/01/2018 Unmet Goal Status: Unmet Reason: COMORBITIES Ulcer/skin breakdown will have a volume reduction of 50% by week 8 Date Initiated: 10/07/2018 Date Inactivated: 11/03/2018 Target Resolution Date: 10/31/2018 Unmet Goal Status:  Unmet Reason: Osteomyelitis Interventions: Assess patient/caregiver ability to obtain necessary supplies Assess patient/caregiver ability to perform ulcer/skin care regimen upon admission and as needed Assess ulceration(s) every visit Provide education on ulcer and skin care Treatment Activities: Skin care regimen initiated : 09/03/2018 Topical wound management initiated : 09/03/2018 Notes: Electronic Signature(s) Signed: 01/02/2019 7:05:19 PM By: Baruch Gouty RN, BSN Entered By: Baruch Gouty on 01/02/2019 18:25:48 -------------------------------------------------------------------------------- Pain Assessment Details Patient Name: Date of Service: [name] 01/02/2019 12:30 PM Medical Record Number:[id number] Patient Account Number: [id number] Date of Birth/Sex: Treating RN: March 25, 1971 (48 y.o. [name] Primary Care Sharline Lehane: [name] Other Clinician: Referring Keon Pender: Treating Rosealie Reach/Extender:[name], [name], [name] in Treatment: 17 Active Problems Location of Pain Severity and Description of Pain Patient Has Paino No Site Locations Rate the pain. Current Pain Level: 0 Pain Management and Medication Current Pain Management: Electronic Signature(s) Signed: 01/02/2019 7:05:19 PM By: Baruch Gouty RN, BSN Entered By: Baruch Gouty on 01/02/2019 18:21:41 -------------------------------------------------------------------------------- Patient/Caregiver Education Details Margorie John 10/2/2020andnbsp12:30 Patient Name: Date of Service: L. PM Medical Record Patient Account Number: [id number] Number: Treating RN: Baruch Gouty Date of Birth/Gender: 09-Jun-1970 (48 y.o. F) Other Clinician: Primary Care Physician:MOREIRA, [name] Treating Linton Ham Referring Physician: Physician/Extender: Juanito Doom in Treatment: 79 Education Assessment Education Provided To: Patient Education Topics  Provided Hyperbaric Oxygenation: Methods: Explain/Verbal Responses: Reinforcements needed, State content correctly Infection: Methods: Explain/Verbal Responses: Reinforcements needed, State content correctly Wound/Skin Impairment: Methods: Explain/Verbal Responses: Reinforcements needed, State content correctly Electronic Signature(s) Signed: 01/02/2019 7:05:19 PM By: Baruch Gouty RN, BSN Entered By: Baruch Gouty on 01/02/2019 18:26:15 -------------------------------------------------------------------------------- Wound Assessment Details Patient Name: Date of Service: [name] 01/02/2019 12:30 PM Medical Record Number:[id number] Patient Account Number: [id number] Date of Birth/Sex: Treating RN: [birth year] (48 y.o. [name], [name] Primary Care Nelissa Bolduc: [name] Other Clinician: Referring Emberley Kral: Treating Dalon Reichart/Extender:[name], [name], [name] in Treatment: 17 Wound Status Wound Number: 43 Primary Diabetic Wound/Ulcer of the Lower Extremity Etiology: Wound Location: Right Foot - Medial Wound Open Wounding Event: Gradually Appeared Status: Date Acquired: 08/04/2018 Comorbid Cataracts, Chronic sinus Weeks Of Treatment: 17 History: problems/congestion, Anemia, Sleep Apnea, Clustered Wound: No Deep Vein Thrombosis, Hypertension, Peripheral Arterial Disease, Type I Diabetes, Osteoarthritis, Osteomyelitis, Neuropathy, Seizure Disorder Wound Measurements Length: (cm) 5.8 % Reducti Width: (cm) 4.5 % Reducti Depth: (cm) 1.9 Epithelia Area: (cm) 20.499 Tunnelin Volume: (cm) 38.948 Undermin Wound Description Classification: Grade 4 Wound Margin: Distinct, outline attached Exudate Amount: Medium Exudate Type: Serosanguineous Exudate Color: red, brown Wound Bed Granulation Amount: Large (67-100%) Granulation Quality: Red, Pink Necrotic Amount: Small (1-33%) Necrotic Quality: Adherent Slough Foul Odor After Cleansing:  No Slough/Fibrino  Yes Exposed Structure Fascia Exposed: No Fat Layer (Subcutaneous Tissue) Exposed: Yes Tendon Exposed: Yes Muscle Exposed: Yes Necrosis of Muscle: Yes Joint Exposed: No Bone Exposed: No on in Area: 71.8% on in Volume: -78.9% lization: Small (1-33%) g: No ing: No Treatment Notes Wound #43 (Right, Medial Foot) 3. Primary Dressing Applied Calcium Alginate Ag 4. Secondary Dressing ABD Pad Dry [name] Roll [name] Electronic Signature(s) Signed: 01/02/2019 7:05:19 PM By: Baruch Gouty RN, BSN Entered By: Baruch Gouty on 01/02/2019 18:23:40 -------------------------------------------------------------------------------- Wound Assessment Details Patient Name: Date of Service: [name] 01/02/2019 12:30 PM Medical Record Number:[id number] Patient Account Number: [id number] Date of Birth/Sex: Treating RN: [birth year] (48 y.o. [name], [name] Primary Care Nyomi Howser: [name] Other Clinician: Referring Celestia Duva: Treating Ramsey Guadamuz/Extender:[name], [name], [name] in Treatment: 17 Wound Status Wound Number: 44 Primary Diabetic Wound/Ulcer of the Lower Extremity Etiology: Wound Location: Right Calcaneus Wound Open Wounding Event: Gradually Appeared Status: Date Acquired: 08/28/2018 Comorbid Cataracts, Chronic sinus Weeks Of Treatment: 17 History: problems/congestion, Anemia, Sleep Apnea, Clustered Wound: No Deep Vein Thrombosis, Hypertension, Peripheral Arterial Disease, Type I Diabetes, Osteoarthritis, Osteomyelitis, Neuropathy, Seizure Disorder Wound Measurements Length: (cm) 1.1 Width: (cm) 0.8 Depth: (cm) 0.1 Area: (cm) 0.691 Volume: (cm) 0.069 Wound Description Classification: Grade 2 Wound Margin: Flat and Intact Exudate Amount: Small Exudate Type: Serosanguineous Exudate Color: red, brown Wound Bed Granulation Amount: Large (67-100%) Granulation Quality: Red Necrotic Amount: None Present (0%) fter Cleansing:  No ino No Exposed Structure sed: No Subcutaneous Tissue) Exposed: Yes sed: No sed: No ed: No d: No % Reduction in Area: 72.5% % Reduction in Volume: 72.5% Epithelialization: Medium (34-66%) Tunneling: No Undermining: No Foul Odor A Slough/Fibr Fascia Expo Fat Layer ( Tendon Expo Muscle Expo Joint Expos Bone Expose Treatment Notes Wound #44 (Right Calcaneus) 3. Primary Dressing Applied Hydrofera Blue 4. Secondary Dressing Dry [name] Roll [name] Electronic Signature(s) Signed: 01/02/2019 7:05:19 PM By: Baruch Gouty RN, BSN Entered By: Baruch Gouty on 01/02/2019 18:24:28 -------------------------------------------------------------------------------- Wound Assessment Details Patient Name: Date of Service: [name] 01/02/2019 12:30 PM Medical Record Number:[id number] Patient Account Number: [id number] Date of Birth/Sex: Treating RN: 1970-04-30 (48 y.o. [name] Primary Care Hilery Wintle: [name] Other Clinician: Referring Tyshae Stair: Treating Navraj Dreibelbis/Extender:[name], [name], [name] in Treatment: 17 Wound Status Wound Number: 46 Primary Abscess Etiology: Wound Location: Right Lower Leg - Lateral Wound Open Wounding Event: Other Lesion Status: Date Acquired: 12/25/2018 Notes: MD IandD today. Weeks Of Treatment: 1 Comorbid Cataracts, Chronic sinus Clustered Wound: No History: problems/congestion, Anemia, Sleep Apnea, Deep Vein Thrombosis, Hypertension, Peripheral Arterial Disease, Type I Diabetes, Osteoarthritis, Osteomyelitis, Neuropathy, Seizure Disorder Wound Measurements Length: (cm) 0.3 Width: (cm) 0.4 Depth: (cm) 1 Area: (cm) 0.094 Volume: (cm) 0.094 % Reduction in Area: -19% % Reduction in Volume: -19% Epithelialization: None Tunneling: Yes Position (o'clock): 3 Maximum Distance: (cm) 3.7 Undermining: Yes Starting Position (o'clock): 12 Ending Position (o'clock): 12 Maximum Distance: (cm) 2.2 Wound  Description Classification: Full Thickness Without Exposed Support Foul Odo Structures Slough/F Wound Distinct, outline attached Margin: Exudate Medium Amount: Exudate Purulent Type: Exudate yellow, brown, green Color: Wound Bed Granulation Amount: None Present (0%) Necrotic Amount: None Present (0%) Fascia E Fat Laye Tendon E Muscle E Joint Ex Bone Exp r After Cleansing: No ibrino No Exposed Structure xposed: No r (Subcutaneous Tissue) Exposed: Yes xposed: No xposed: No posed: No osed: No Treatment Notes Wound #46 (Right, Lateral Lower Leg) 3. Primary Dressing Applied Calcium Alginate Ag 4. Secondary Dressing ABD Pad Dry [name] Roll [name]  Electronic Signature(s) Signed: 01/02/2019 7:05:19 PM By: Baruch Gouty RN, BSN Entered By: Baruch Gouty on 01/02/2019 18:25:23 -------------------------------------------------------------------------------- Vitals Details Patient Name: Date of Service: [name] 01/02/2019 12:30 PM Medical Record Number:[id number] Patient Account Number: [id number] Date of Birth/Sex: Treating RN: 07-08-1970 (48 y.o. [name] Primary Care Damein Gaunce: [name] Other Clinician: Referring Ever Gustafson: Treating Gracen Southwell/Extender:[name], [name], [name] in Treatment: 17 Vital Signs Time Taken: 16:00 Reference Range: 80 - 120 mg / dl Height (in): 67 Weight (lbs): 125 Body Mass Index (BMI): 19.6 Notes see HBO notes Electronic Signature(s) Signed: 01/02/2019 7:05:19 PM By: Baruch Gouty RN, BSN Entered By: Baruch Gouty on 01/02/2019 18:21:32

## 2019-01-02 NOTE — Progress Notes (Signed)
[name], [name] ([id number]) Visit Report for 01/02/2019 SuperBill Details Patient Name: Date of Service: [name], [name] 01/02/2019 Medical Record Number:[id number] Patient Account Number: [id number] Date of Birth/Sex: Treating RN: 29-Jan-1971 (48 y.o. 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

## 2019-01-05 ENCOUNTER — Other Ambulatory Visit: Payer: Self-pay

## 2019-01-05 ENCOUNTER — Encounter (HOSPITAL_BASED_OUTPATIENT_CLINIC_OR_DEPARTMENT_OTHER): Payer: Medicare HMO | Admitting: Internal Medicine

## 2019-01-05 DIAGNOSIS — E10621 Type 1 diabetes mellitus with foot ulcer: Secondary | ICD-10-CM | POA: Diagnosis not present

## 2019-01-05 LAB — GLUCOSE, CAPILLARY
Glucose-Capillary: 314 mg/dL — ABNORMAL HIGH (ref 70–99)
Glucose-Capillary: 250 mg/dL — ABNORMAL HIGH (ref 70–99)
Glucose-Capillary: 229 mg/dL — ABNORMAL HIGH (ref 70–99)
Glucose-Capillary: 256 mg/dL — ABNORMAL HIGH (ref 70–99)

## 2019-01-05 NOTE — Progress Notes (Signed)
[name], [name] ([id number]) Visit Report for 01/05/2019 SuperBill Details Patient Name: Date of Service: [name], [name] 01/05/2019 Medical Record Number:[id number] Patient Account Number: [id number] Date of Birth/Sex: Treating RN: 1970/10/15 (48 y.o. 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

## 2019-01-05 NOTE — Progress Notes (Signed)
CRICKET, GOODLIN ([id number]) Visit Report for 01/05/2019 Arrival Information Details Patient Name: Date of Service: [name], [name] 01/05/2019 1:00 PM Medical Record Number:[id number] Patient Account Number: [id number] Date of Birth/Sex: Treating RN: 06/13/1970 (48 y.o. [name], [name] Primary Care [name]: [name] Other Clinician: [name] Referring Chassidy Layson: Treating [name]/Extender:[name], [name], [name] in Treatment: 26 Visit Information History Since Last Visit Added or deleted any medications: No Patient Arrived: Wheel Chair Any new allergies or adverse reactions: No Arrival Time: 12:55 Had a fall or experienced change in No activities of daily living that may affect Accompanied By: self risk of falls: Transfer Assistance: None Signs or symptoms of abuse/neglect since last No Patient Identification Verified: Yes visito Secondary Verification Process Completed: Yes Hospitalized since last visit: No Patient Requires Transmission-Based No Implantable device outside of the clinic excluding No Precautions: cellular tissue based products placed in the center Patient Has Alerts: No since last visit: Pain Present Now: No Electronic Signature(s) Signed: 01/05/2019 4:14:10 PM By: [name] EMT/HBOT Entered By: [name] on 01/05/2019 13:52:07 -------------------------------------------------------------------------------- Encounter Discharge Information Details Patient Name: Date of Service: [name] 01/05/2019 1:00 PM Medical Record Number:[id number] Patient Account Number: [id number] Date of Birth/Sex: Treating RN: 1970/05/25 (48 y.o. [name] Primary Care [name]: [name] Other Clinician: [name] Referring Severiano Utsey: Treating [name]/Extender:[name], [name], [name] in Treatment: 17 Encounter Discharge Information Items Discharge Condition: Stable Ambulatory Status:  Wheelchair Discharge Destination: Home Transportation: Private Auto Accompanied By: self Schedule Follow-up Appointment: Yes Clinical Summary of Care: Patient Declined Electronic Signature(s) Signed: 01/05/2019 4:14:10 PM By: [name] EMT/HBOT Entered By: [name] on 01/05/2019 16:13:44 Patient/Caregiver Education Details Date of Service: -------------------------------------------------------------------------------- [name] 10/5/2020andnbsp1:00 PM Patient Name: L. Patient Account Number: [id number] Medical Record Treating RN: [name] [id number] Number: Other Clinician: [name] Date of Birth/Gender: 03-22-1971 (48 y.o. F) Treating Linton Ham Primary Care Physician: [name] Physician/Extender: Referring Physician: Juanito Doom in Treatment: 17 Education Assessment Education Provided To: Patient Education Topics Provided Hyperbaric Oxygenation: Methods: Explain/Verbal Responses: State content correctly Electronic Signature(s) Signed: 01/05/2019 4:14:10 PM By: [name] EMT/HBOT Entered By: [name] on 01/05/2019 16:13:32 -------------------------------------------------------------------------------- Vitals Details Patient Name: Date of Service: [name] 01/05/2019 1:00 PM Medical Record Number:[id number] Patient Account Number: [id number] Date of Birth/Sex: Treating RN: 1971/01/16 (48 y.o. [name] Primary Care [name]: [name] Other Clinician: [name] Referring Shadman Tozzi: Treating Alicja [name]/Extender:[name], [name], [name] in Treatment: 17 Vital Signs Time Taken: 13:00 Temperature (F): 98 Height (in): 67 Pulse (bpm): 99 Weight (lbs): 125 Respiratory Rate (breaths/min): 17 Body Mass Index (BMI): 19.6 Blood Pressure (mmHg): 137/69 Capillary Blood Glucose (mg/dl): 229 Reference Range: 80 - 120 mg / dl Electronic Signature(s) Signed: 01/05/2019 4:14:10 PM  By: [name] EMT/HBOT Entered By: [name] on 01/05/2019 13:53:12

## 2019-01-05 NOTE — Progress Notes (Addendum)
[name], [name] ([id number]) Visit Report for 01/05/2019 HBO Details Patient Name: Date of Service: [name], [name] 01/05/2019 1:00 PM Medical Record Number:[id number] Patient Account Number: [id number] Date of Birth/Sex: Treating RN: 1971-01-16 (48 y.o. Nancy Fetter Primary Care January Bergthold: [name] Other Clinician: [name] Referring Abryana Lykens: Treating Sunshyne Horvath/Extender:[name], [name], [name] in Treatment: 17 HBO Treatment Course Details Treatment Course Number: 4 Ordering Keiva Dina: Linton Ham Total Treatments Ordered: 50 HBO Treatment Start Date: 09/30/2018 HBO Indication: Diabetic Ulcer(s) of the Lower Extremity HBO Treatment Details Treatment Number: 60 Patient Type: Outpatient Chamber Type: Monoplace Chamber Serial #: [id number] Treatment Protocol: 2.5 ATA with 90 minutes oxygen, with two 5 minute air breaks Treatment Details Compression Rate Down: 2.0 psi / minute De-Compression Rate Up: 2.0 psi / minute Air breaks and CompressTx Pressure breathing periods DecompressDecompress Begins Reached (leave unused spaces Begins Ends blank) Chamber Pressure (ATA)1 2.5 2.5 2.5 2.5 2.5 --2.5 1 Clock Time (24 hr) 13:12 13:24 46:9629:5284:1324:40--10:27 15:16 Treatment Length: 124 (minutes) Treatment Segments: 4 Vital Signs Capillary Blood Glucose Reference Range: 80 - 120 mg / dl HBO Diabetic Blood Glucose Intervention Range: <131 mg/dl or >249 mg/dl Time Vitals Blood Respiratory Capillary Blood Glucose Pulse Action Type: Pulse: Temperature: Taken: Pressure: Rate: Glucose (mg/dl): Meter #: Oximetry (%) Taken: Pre 13:00 137/69 99 17 98 229 Post 15:20 157/93 87 16 98.4 256 Treatment Response Treatment Toleration: Well Treatment Completion Treatment Completed without Adverse Event Status: Additional Procedure Documentation Tissue Sevierity: Necrosis of bone Shetara Launer Notes No concerns with treatment given Physician HBO Attestation: I  certify that I supervised this HBO treatment in accordance with Medicare guidelines. A trained Yes emergency response team is readily available per hospital policies and procedures. Continue HBOT as ordered. Yes Electronic Signature(s) Signed: 01/05/2019 6:11:18 PM By: Linton Ham MD Previous Signature: 01/05/2019 4:14:10 PM Version By: [name] EMT/HBOT Entered By: Linton Ham on 01/05/2019 18:10:10 -------------------------------------------------------------------------------- HBO Safety Checklist Details Patient Name: Date of Service: [name] 01/05/2019 1:00 PM Medical Record Number:[id number] Patient Account Number: [id number] Date of Birth/Sex: Treating RN: Nov 26, 1970 (48 y.o. Nancy Fetter Primary Care Vonna Brabson: [name] Other Clinician: [name] Referring Ules Marsala: Treating Ryder Chesmore/Extender:[name], [name], [name] in Treatment: 17 HBO Safety Checklist Items Safety Checklist Consent Form Signed Patient voided / foley secured and emptied When did you last eato 1200 - [place] sammy Last dose of injectable or oral agent insulin pump NA Ostomy pouch emptied and vented if applicable All implantable devices assessed, documented and approved NA Intravenous access site secured and place Valuables secured Linens and cotton and cotton/polyester blend (less than 51% polyester) Personal oil-based products / skin lotions / body lotions removed NA Wigs or hairpieces removed NA Smoking or tobacco materials removed Books / newspapers / magazines / loose paper removed Cologne, aftershave, perfume and deodorant removed Jewelry removed (may wrap wedding band) Make-up removed Hair care products removed Battery operated devices (external) removed NA Heating patches and chemical warmers removed NA Titanium eyewear removed NA Nail polish cured greater than 10 hours NA Casting material cured greater than 10 hours NA Hearing aids  removed NA Loose dentures or partials removed NA Prosthetics have been removed Patient demonstrates correct use of air break device (if applicable) Patient concerns have been addressed Patient grounding bracelet on and cord attached to chamber Specifics for Inpatients (complete in addition to above) Medication sheet sent with patient Intravenous medications needed or due during therapy sent with patient Drainage tubes (e.g. nasogastric tube or chest tube secured  and vented) Endotracheal or Tracheotomy tube secured Cuff deflated of air and inflated with saline Airway suctioned Electronic Signature(s) Signed: 01/05/2019 1:54:11 PM By: [name] EMT/HBOT Entered By: [name] on 01/05/2019 13:54:10

## 2019-01-06 ENCOUNTER — Encounter (HOSPITAL_BASED_OUTPATIENT_CLINIC_OR_DEPARTMENT_OTHER): Payer: Medicare HMO | Admitting: Internal Medicine

## 2019-01-06 DIAGNOSIS — E10621 Type 1 diabetes mellitus with foot ulcer: Secondary | ICD-10-CM | POA: Diagnosis not present

## 2019-01-06 LAB — GLUCOSE, CAPILLARY
Glucose-Capillary: 122 mg/dL — ABNORMAL HIGH (ref 70–99)
Glucose-Capillary: 269 mg/dL — ABNORMAL HIGH (ref 70–99)

## 2019-01-06 NOTE — Progress Notes (Addendum)
[name], [name] ([id number]) Visit Report for 01/06/2019 HBO Details Patient Name: Date of Service: [name], [name] 01/06/2019 1:00 PM Medical Record Number:[id number] Patient Account Number: [id number] Date of Birth/Sex: Treating RN: [birth year] (48 y.o. [name] Primary Care Chasady Longwell: [name] Other Clinician: [name] Referring [name]: Treating [name]/Extender:[name], [name], [name] in Treatment: 17 HBO Treatment Course Details Treatment Course Number: 4 Ordering Fynley Chrystal: Linton Ham Total Treatments Ordered: 50 HBO Treatment Start Date: 09/30/2018 HBO Indication: Diabetic Ulcer(s) of the Lower Extremity HBO Treatment Details Treatment Number: 61 Patient Type: Outpatient Chamber Type: Monoplace Chamber Serial #: [id number] Treatment Protocol: 2.5 ATA with 90 minutes oxygen, with two 5 minute air breaks Treatment Details Compression Rate Down: 2.0 psi / minute De-Compression Rate Up: 2.0 psi / minute Air breaks and CompressTx Pressure breathing periods DecompressDecompress Begins Reached (leave unused spaces Begins Ends blank) Chamber Pressure (ATA)1 2.5 2.5 2.5 2.5 2.5 --2.5 1 Clock Time (24 hr) 13:24 13:36 27:2536:6440:3474:25--95:63 15:28 Treatment Length: 124 (minutes) Treatment Segments: 4 Vital Signs Capillary Blood Glucose Reference Range: 80 - 120 mg / dl HBO Diabetic Blood Glucose Intervention Range: <131 mg/dl or >249 mg/dl Time Vitals Blood Respiratory Capillary Blood Glucose Pulse Action Type: Pulse: Temperature: Taken: Pressure: Rate: Glucose (mg/dl): Meter #: Oximetry (%) Taken: Pre 13:05 120/71 97 16 98.1 135 Post 15:30 156/87 87 17 98.3 269 Treatment Response Treatment Toleration: Well Treatment Completion Treatment Completed without Adverse Event Status: Additional Procedure Documentation Tissue Sevierity: Necrosis of bone [name] Notes No concerns with treatment given. The patient came to my office  board for treatment to complain about an odd sensation she could not describe in the left foot and ankle. She is also complaining of warmth and discomfort in the right lower leg. She was asking for bilateral MRIs. She still has purulent drainage on the right where the abscess was lanced. I could not find anything on the left. Physician HBO Attestation: I certify that I supervised this HBO treatment in accordance with Medicare guidelines. A trained Yes emergency response team is readily available per hospital policies and procedures. Continue HBOT as ordered. Yes Electronic Signature(s) Signed: 01/06/2019 5:29:39 PM By: Linton Ham MD Entered By: Linton Ham on 01/06/2019 17:27:11 -------------------------------------------------------------------------------- HBO Safety Checklist Details Patient Name: Date of Service: [name] 01/06/2019 1:00 PM Medical Record Number:[id number] Patient Account Number: [id number] Date of Birth/Sex: Treating RN: 1971/02/19 (48 y.o. [name] Primary Care Bela Bonaparte: [name] Other Clinician: Referring [name]: Treating [name]/Extender:[name], [name], [name] in Treatment: 17 HBO Safety Checklist Items Safety Checklist Consent Form Signed Patient voided / foley secured and emptied When did you last eato 1200 - [place] Last dose of injectable or oral agent insulin pump NA Ostomy pouch emptied and vented if applicable All implantable devices assessed, documented and approved NA Intravenous access site secured and place Valuables secured Linens and cotton and cotton/polyester blend (less than 51% polyester) Personal oil-based products / skin lotions / body lotions removed NA Wigs or hairpieces removed NA Smoking or tobacco materials removed Books / newspapers / magazines / loose paper removed Cologne, aftershave, perfume and deodorant removed Jewelry removed (may wrap wedding band) Make-up removed Hair  care products removed Battery operated devices (external) removed NA Heating patches and chemical warmers removed NA Titanium eyewear removed NA Nail polish cured greater than 10 hours NA Casting material cured greater than 10 hours NA Hearing aids removed NA Loose dentures or partials removed NA Prosthetics have been removed Patient demonstrates correct use  of air break device (if applicable) Patient concerns have been addressed Patient grounding bracelet on and cord attached to chamber Specifics for Inpatients (complete in addition to above) Medication sheet sent with patient Intravenous medications needed or due during therapy sent with patient Drainage tubes (e.g. nasogastric tube or chest tube secured and vented) Endotracheal or Tracheotomy tube secured Cuff deflated of air and inflated with saline Airway suctioned Electronic Signature(s) Signed: 01/06/2019 1:45:07 PM By: [name] EMT/HBOT Entered By: [name] on 01/06/2019 13:45:06

## 2019-01-07 ENCOUNTER — Other Ambulatory Visit: Payer: Self-pay | Admitting: Internal Medicine

## 2019-01-07 ENCOUNTER — Other Ambulatory Visit (HOSPITAL_COMMUNITY): Payer: Self-pay | Admitting: Internal Medicine

## 2019-01-07 ENCOUNTER — Other Ambulatory Visit: Payer: Self-pay

## 2019-01-07 ENCOUNTER — Encounter (HOSPITAL_BASED_OUTPATIENT_CLINIC_OR_DEPARTMENT_OTHER): Payer: Medicare HMO | Admitting: Physician Assistant

## 2019-01-07 DIAGNOSIS — M86671 Other chronic osteomyelitis, right ankle and foot: Secondary | ICD-10-CM

## 2019-01-07 DIAGNOSIS — E10621 Type 1 diabetes mellitus with foot ulcer: Secondary | ICD-10-CM | POA: Diagnosis not present

## 2019-01-07 DIAGNOSIS — L97514 Non-pressure chronic ulcer of other part of right foot with necrosis of bone: Secondary | ICD-10-CM

## 2019-01-07 DIAGNOSIS — L02415 Cutaneous abscess of right lower limb: Secondary | ICD-10-CM

## 2019-01-07 LAB — GLUCOSE, CAPILLARY
Glucose-Capillary: >600 mg/dL (ref 70–99)
Glucose-Capillary: 524 mg/dL (ref 70–99)

## 2019-01-07 NOTE — Progress Notes (Addendum)
[name], CHAMBER ([id number]) Visit Report for 01/07/2019 HBO Details Patient Name: Date of Service: [name], [name] 01/07/2019 1:00 PM Medical Record Number:[id number] Patient Account Number: [id number] Date of Birth/Sex: Treating RN: 1970-07-02 (48 y.o. [name], [name] Primary Care Misaki Sozio: [name] Other Clinician: Referring Misti Towle: Treating Toni Hoffmeister/Extender:Stone III, Lennox Grumbles, [name] in Treatment: 18 HBO Treatment Course Details Treatment Course Number: 4 Ordering Norman Bier: Linton Ham Total Treatments Ordered: 50 HBO Treatment Start Date: 09/30/2018 HBO Indication: Diabetic Ulcer(s) of the Lower Extremity HBO Treatment Details Treatment Number: 62 Patient Type: Outpatient Chamber Type: Monoplace Chamber Serial #: [id number] Treatment Protocol: 2.5 ATA with 90 minutes oxygen, with two 5 minute air breaks Treatment Details Compression Rate Down: 2.0 psi / minute De-Compression Rate Up: 2.0 psi / minute Air breaks and CompressTx Pressure breathing periods DecompressDecompress Begins Reached (leave unused spaces Begins Ends blank) Chamber Pressure (ATA)1 2.5 2.5 2.5 2.5 2.5 --2.5 1 Clock Time (24 hr) 13:10 13:22 07:6226:3335:4562:56--38:93 15:14 Treatment Length: 124 (minutes) Treatment Segments: 4 Vital Signs Capillary Blood Glucose Reference Range: 80 - 120 mg / dl HBO Diabetic Blood Glucose Intervention Range: <131 mg/dl or >249 mg/dl Time Vitals Blood Respiratory Capillary Blood Glucose Pulse Action Type: Pulse: Temperature: Taken: Pressure: Rate: Glucose (mg/dl): Meter #: Oximetry (%) Taken: Pre 12:55 136/70 101 18 98.7 600 Post 15:17 163/86 90 17 98.2 524 Treatment Response Treatment Toleration: Well Treatment Completion Treatment Completed without Adverse Event Status: Additional Procedure Documentation Tissue Sevierity: Necrosis of bone Physician HBO Attestation: I certify that I supervised this HBO treatment in accordance  with Medicare guidelines. A trained Yes Yes emergency response team is readily available per hospital policies and procedures. Continue HBOT as ordered. Yes Electronic Signature(s) Signed: 01/11/2019 10:37:03 PM By: Worthy Keeler PA-C Previous Signature: 01/08/2019 1:38:24 PM Version By: [name] EMT/HBOT Entered By: Worthy Keeler on 01/11/2019 22:34:52 -------------------------------------------------------------------------------- HBO Safety Checklist Details Patient Name: Date of Service: [name] 01/07/2019 1:00 PM Medical Record Number:[id number] Patient Account Number: [id number] Date of Birth/Sex: Treating RN: 30-Aug-1970 (48 y.o. [name], [name] Primary Care Verdon Ferrante: [name] Other Clinician: Referring Karlisha Mathena: Treating Lanell Carpenter/Extender:Stone III, Lennox Grumbles, [name] in Treatment: 18 HBO Safety Checklist Items Safety Checklist Consent Form Signed Patient voided / foley secured and emptied When did you last eato 1200 - [place] Last dose of injectable or oral agent insulin pump NA Ostomy pouch emptied and vented if applicable NA All implantable devices assessed, documented and approved NA Intravenous access site secured and place Valuables secured Linens and cotton and cotton/polyester blend (less than 51% polyester) Personal oil-based products / skin lotions / body lotions removed NA Wigs or hairpieces removed NA Smoking or tobacco materials removed Books / newspapers / magazines / loose paper removed Cologne, aftershave, perfume and deodorant removed Jewelry removed (may wrap wedding band) Make-up removed Hair care products removed Battery operated devices (external) removed NA Heating patches and chemical warmers removed NA Titanium eyewear removed NA Nail polish cured greater than 10 hours NA Casting material cured greater than 10 hours NA Hearing aids removed NA Loose dentures or partials removed NA Prosthetics have been  removed Patient demonstrates correct use of air break device (if applicable) Patient concerns have been addressed Patient grounding bracelet on and cord attached to chamber Specifics for Inpatients (complete in addition to above) Medication sheet sent with patient Intravenous medications needed or due during therapy sent with patient Drainage tubes (e.g. nasogastric tube or chest tube secured and vented) Endotracheal or Tracheotomy tube  secured Cuff deflated of air and inflated with saline Airway suctioned Electronic Signature(s) Signed: 01/07/2019 1:33:51 PM By: [name] EMT/HBOT Entered By: [name] on 01/07/2019 13:33:50

## 2019-01-08 ENCOUNTER — Encounter (HOSPITAL_BASED_OUTPATIENT_CLINIC_OR_DEPARTMENT_OTHER): Payer: Medicare HMO | Admitting: Internal Medicine

## 2019-01-08 DIAGNOSIS — E10621 Type 1 diabetes mellitus with foot ulcer: Secondary | ICD-10-CM | POA: Diagnosis not present

## 2019-01-08 LAB — GLUCOSE, CAPILLARY
Glucose-Capillary: 172 mg/dL — ABNORMAL HIGH (ref 70–99)
Glucose-Capillary: 266 mg/dL — ABNORMAL HIGH (ref 70–99)

## 2019-01-08 NOTE — Progress Notes (Signed)
[name], [name] ([id number]) Visit Report for 01/06/2019 Arrival Information Details Patient Name: Date of Service: [name], [name] 01/06/2019 1:00 PM Medical Record Number:[id number] Patient Account Number: [id number] Date of Birth/Sex: Treating RN: 07-Nov-1970 (48 y.o. [name] Primary Care Wes Lezotte: [name] Other Clinician: Referring Jayion Schneck: Treating Eland Lamantia/Extender:[name], [name], [name] in Treatment: 41 Visit Information History Since Last Visit Added or deleted any medications: No Patient Arrived: Wheel Chair Any new allergies or adverse reactions: No Arrival Time: 13:00 Had a fall or experienced change in No activities of daily living that may affect Accompanied By: self risk of falls: Transfer Assistance: None Signs or symptoms of abuse/neglect since last No Patient Identification Verified: Yes visito Secondary Verification Process Completed: Yes Hospitalized since last visit: No Patient Requires Transmission-Based No Implantable device outside of the clinic excluding No Precautions: cellular tissue based products placed in the center Patient Has Alerts: No since last visit: Pain Present Now: No Electronic Signature(s) Signed: 01/08/2019 1:38:24 PM By: [name] EMT/HBOT Entered By: [name] on 01/06/2019 13:44:05 -------------------------------------------------------------------------------- Encounter Discharge Information Details Patient Name: Date of Service: [name] 01/06/2019 1:00 PM Medical Record Number:[id number] Patient Account Number: [id number] Date of Birth/Sex: Treating RN: 1970/04/27 (48 y.o. [name] Primary Care Deren Degrazia: [name] Other Clinician: Referring Deetra Booton: Treating Alila Sotero/Extender:[name], [name], [name] in Treatment: 17 Encounter Discharge Information Items Discharge Condition: Stable Ambulatory Status: Wheelchair Discharge Destination:  Home Transportation: Private Auto Accompanied By: self Schedule Follow-up Appointment: Yes Clinical Summary of Care: Patient Declined Electronic Signature(s) Signed: 01/08/2019 1:38:24 PM By: [name] EMT/HBOT Entered By: [name] on 01/06/2019 15:48:14 Patient/Caregiver Education Details Date of Service: -------------------------------------------------------------------------------- [name] 10/6/2020andnbsp1:00 PM Patient Name: L. Patient Account Number: [id number] Medical Record Treating RN: Carlene Coria [id number] Number: Other Clinician: Date of Birth/Gender: 1970-12-08 (48 y.o. F) Treating Linton Ham Primary Care Physician: [name] Physician/Extender: Referring Physician: Juanito Doom in Treatment: 17 Education Assessment Education Provided To: Patient Education Topics Provided Hyperbaric Oxygenation: Methods: Explain/Verbal Responses: State content correctly Electronic Signature(s) Signed: 01/08/2019 1:38:24 PM By: [name] EMT/HBOT Entered By: [name] on 01/06/2019 15:48:00 -------------------------------------------------------------------------------- Vitals Details Patient Name: Date of Service: [name] 01/06/2019 1:00 PM Medical Record Number:[id number] Patient Account Number: [id number] Date of Birth/Sex: Treating RN: 12/19/1970 (48 y.o. [name] Primary Care Jettie Lazare: [name] Other Clinician: Referring Moni Rothrock: Treating Jennice Renegar/Extender:[name], [name], [name] in Treatment: 17 Vital Signs Time Taken: 13:05 Temperature (F): 98.1 Height (in): 67 Pulse (bpm): 97 Weight (lbs): 125 Respiratory Rate (breaths/min): 16 Body Mass Index (BMI): 19.6 Blood Pressure (mmHg): 120/71 Capillary Blood Glucose (mg/dl): 135 Reference Range: 80 - 120 mg / dl Electronic Signature(s) Signed: 01/08/2019 1:38:24 PM By: [name] EMT/HBOT Entered By: [name] on  01/06/2019 13:44:29

## 2019-01-08 NOTE — Progress Notes (Signed)
NESTA, SCATURRO ([id number]) Visit Report for 01/06/2019 SuperBill Details Patient Name: Date of Service: [name], [name] 01/06/2019 Medical Record Number:[id number] Patient Account Number: [id number] Date of Birth/Sex: Treating RN: 14-May-1970 (48 y.o. 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

## 2019-01-08 NOTE — Progress Notes (Signed)
[name], [name] ([id number]) Visit Report for 01/07/2019 Arrival Information Details Patient Name: Date of Service: [name], [name] 01/07/2019 1:00 PM Medical Record Number:[id number] Patient Account Number: [id number] Date of Birth/Sex: Treating RN: Aug 15, 1970 (48 y.o. [name], [name] Primary Care Aaren Atallah: [name] Other Clinician: Referring Ginna Schuur: Treating Tammey Deeg/Extender:Stone III, Lennox Grumbles, [name] in Treatment: 18 Visit Information History Since Last Visit Added or deleted any medications: No Patient Arrived: Wheel Chair Any new allergies or adverse reactions: No Arrival Time: 12:50 Had a fall or experienced change in No activities of daily living that may affect Accompanied By: self risk of falls: Transfer Assistance: None Signs or symptoms of abuse/neglect since last No Patient Identification Verified: Yes visito Secondary Verification Process Completed: Yes Hospitalized since last visit: No Patient Requires Transmission-Based No Implantable device outside of the clinic excluding No Precautions: cellular tissue based products placed in the center Patient Has Alerts: No since last visit: Pain Present Now: No Electronic Signature(s) Signed: 01/08/2019 1:38:24 PM By: [name] EMT/HBOT Entered By: [name] on 01/07/2019 13:32:12 -------------------------------------------------------------------------------- Encounter Discharge Information Details Patient Name: Date of Service: [name] 01/07/2019 1:00 PM Medical Record Number:[id number] Patient Account Number: [id number] Date of Birth/Sex: Treating RN: [birth year] (48 y.o. [name] Primary Care Tyauna Lacaze: [name] Other Clinician: Referring Mikenna Bunkley: Treating Jeimy Bickert/Extender:Stone III, Lennox Grumbles, [name] in Treatment: 88 Encounter Discharge Information Items Discharge Condition: Stable Ambulatory Status: Wheelchair Discharge Destination:  Home Transportation: Private Auto Accompanied By: self Schedule Follow-up Appointment: Yes Clinical Summary of Care: Patient Declined Electronic Signature(s) Signed: 01/08/2019 1:38:24 PM By: [name] EMT/HBOT Entered By: [name] on 01/07/2019 15:45:19 Patient/Caregiver Education Details Date of Service: -------------------------------------------------------------------------------- [name] 10/7/2020andnbsp1:00 PM Patient Name: L. Patient Account Number: [id number] Medical Record Treating RN: Baruch Gouty [id number] Number: Other Clinician: Date of Birth/Gender: [birth year] (48 y.o. F) Treating Worthy Keeler Primary Care Physician: [name] Physician/Extender: Referring Physician: Juanito Doom in Treatment: 10 Education Assessment Education Provided To: Patient Education Topics Provided Hyperbaric Oxygenation: Methods: Explain/Verbal Responses: State content correctly Electronic Signature(s) Signed: 01/08/2019 1:38:24 PM By: [name] EMT/HBOT Entered By: [name] on 01/07/2019 15:45:08 -------------------------------------------------------------------------------- Vitals Details Patient Name: Date of Service: [name] 01/07/2019 1:00 PM Medical Record Number:[id number] Patient Account Number: [id number] Date of Birth/Sex: Treating RN: [birth year] (48 y.o. [name] Primary Care Stan Cantave: [name] Other Clinician: Referring Leamon Palau: Treating Staley Budzinski/Extender:Stone III, Lennox Grumbles, [name] in Treatment: 18 Vital Signs Time Taken: 12:55 Temperature (F): 98.7 Height (in): 67 Pulse (bpm): 101 Weight (lbs): 125 Respiratory Rate (breaths/min): 18 Body Mass Index (BMI): 19.6 Blood Pressure (mmHg): 136/70 Capillary Blood Glucose (mg/dl): 600 Reference Range: 80 - 120 mg / dl Notes Kaleiah Kutzer made aware of pts elevated CBG. Pt also took insulin injection Electronic Signature(s) Signed:  01/08/2019 1:38:24 PM By: [name] EMT/HBOT Entered By: [name] on 01/07/2019 13:33:10

## 2019-01-08 NOTE — Progress Notes (Signed)
[name], [name] ([id number]) Visit Report for 12/19/2018 Arrival Information Details Patient Name: Date of Service: [name], [name] 12/19/2018 3:00 PM Medical Record Number:[id number] Patient Account Number: [id number] Date of Birth/Sex: Treating RN: [birth year] (48 y.o. [name] Primary Care Maddax Palinkas: [name] Other Clinician: Referring Kortnie Stovall: Treating Melonie Germani/Extender:[name], [name], [name] in Treatment: 15 Visit Information History Since Last Visit All ordered tests and consults were completed: No Patient Arrived: Wheel Chair Added or deleted any medications: No Arrival Time: 15:48 Any new allergies or adverse reactions: No Accompanied By: self Had a fall or experienced change in No activities of daily living that may affect Transfer Assistance: None risk of falls: Patient Requires Transmission-Based No Signs or symptoms of abuse/neglect since last No Precautions: visito Patient Has Alerts: No Hospitalized since last visit: No Implantable device outside of the clinic excluding No cellular tissue based products placed in the center since last visit: Has Dressing in Place as Prescribed: Yes Pain Present Now: No Electronic Signature(s) Signed: 01/08/2019 4:32:31 PM By: [name] Entered By: [name] on 12/19/2018 15:48:39 -------------------------------------------------------------------------------- Encounter Discharge Information Details Patient Name: Date of Service: Nancy Gauze. 12/19/2018 3:00 PM Medical Record Number:[id number] Patient Account Number: [id number] Date of Birth/Sex: Treating RN: Jan 26, 1971 (48 y.o. [name] Primary Care Keesha Pellum: [name] Other Clinician: Referring Tamyra Fojtik: Treating Dyson Sevey/Extender:[name], [name], [name] in Treatment: 15 Encounter Discharge Information Items Post Procedure Vitals Discharge Condition: Stable Temperature (F):  97.9 Ambulatory Status: Wheelchair Pulse (bpm): 85 Discharge Destination: Home Respiratory Rate (breaths/min): 18 Transportation: Private Auto Blood Pressure (mmHg): 165/96 Accompanied By: self Schedule Follow-up Appointment: Yes Clinical Summary of Care: Electronic Signature(s) Signed: 12/19/2018 5:30:49 PM By: Deon Pilling Entered By: Deon Pilling on 12/19/2018 17:29:47 -------------------------------------------------------------------------------- Lower Extremity Assessment Details Patient Name: Date of Service: [name], [name] 12/19/2018 3:00 PM Medical Record Number:[id number] Patient Account Number: [id number] Date of Birth/Sex: Treating RN: [birth year] (48 y.o. [name] Primary Care Kaileen Bronkema: [name] Other Clinician: Referring Quindon Denker: Treating Jailyn Leeson/Extender:[name], [name], [name] in Treatment: 15 Edema Assessment Assessed: [Left: No] [Right: No] Edema: [Left: No] [Right: Yes] Calf Left: Right: Point of Measurement: cm From Medial Instep cm 28 cm Ankle Left: Right: Point of Measurement: cm From Medial Instep cm 21 cm Vascular Assessment Pulses: Dorsalis Pedis Palpable: [Right:Yes] Electronic Signature(s) Signed: 01/08/2019 4:32:31 PM By: [name] Entered By: [name] on 12/19/2018 15:50:43 -------------------------------------------------------------------------------- Multi Wound Chart Details Patient Name: Date of Service: Nancy Gauze. 12/19/2018 3:00 PM Medical Record Number:[id number] Patient Account Number: [id number] Date of Birth/Sex: Treating RN: 1970/04/22 (48 y.o. [name] Primary Care Kilan Banfill: Other Clinician: [name] Referring Belkys Henault: Treating Starkisha Tullis/Extender:[name], [name], [name] in Treatment: 15 Vital Signs Height(in): 25 Capillary Blood 347 Glucose(mg/dl): Weight(lbs): 125 Pulse(bpm): 35 Body Mass Index(BMI): 20 Blood Pressure(mmHg):  165/96 Temperature(F): 97.9 Respiratory 18 Rate(breaths/min): Photos: [43:No Photos] [44:No Photos] [N/A:N/A] Wound Location: [43:Right Foot - Medial] [44:Right Calcaneus] [N/A:N/A] Wounding Event: [43:Gradually Appeared] [44:Gradually Appeared] [N/A:N/A] Primary Etiology: [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] [44:Lower Extremity] Comorbid History: [43:Cataracts, Chronic sinus Cataracts, Chronic sinus N/A problems/congestion, Anemia, Sleep Apnea, Deep Anemia, Sleep Apnea, Deep Vein Thrombosis, Hypertension, Peripheral Hypertension, Peripheral Arterial Disease, Type I Diabetes,  Osteoarthritis, Osteomyelitis, Neuropathy, Osteomyelitis, Neuropathy, Seizure Disorder] [44:problems/congestion, Vein Thrombosis, Arterial Disease, Type I Diabetes, Osteoarthritis, Seizure Disorder] Date Acquired: [43:08/04/2018] [44:08/28/2018] [N/A:N/A] Weeks of Treatment: [43:15] [44:15] [N/A:N/A] Wound Status: [43:Open] [44:Open] [N/A:N/A] Measurements L x W x D 5.7x4.8x2.9 [44:1.7x1x0.2] [N/A:N/A] (cm) Area (  cm) : [43:21.488] [44:1.335] [N/A:N/A] Volume (cm) : [75:43.606] [44:0.267] [N/A:N/A] % Reduction in Area: [43:70.40%] [44:46.90%] [N/A:N/A] % Reduction in Volume: -186.20% [44:-6.40%] [N/A:N/A] Classification: [43:Grade 4] [44:Grade 2] [N/A:N/A] Exudate Amount: [43:Medium] [44:Small] [N/A:N/A] Exudate Type: [43:Serosanguineous] [44:Serosanguineous] [N/A:N/A] Exudate Color: [43:red, brown] [44:red, brown] [N/A:N/A] Wound Margin: [43:Distinct, outline attached Flat and Intact] [N/A:N/A] Granulation Amount: [43:Large (67-100%)] [44:Small (1-33%)] [N/A:N/A] Granulation Quality: [43:Red, Pink] [44:Pink] [N/A:N/A] Necrotic Amount: [43:Small (1-33%)] [44:Large (67-100%)] [N/A:N/A] Necrotic Tissue: [43:Eschar, Adherent Slough Adherent Slough] [N/A:N/A] Exposed Structures: [43:Fat Layer (Subcutaneous Fat Layer (Subcutaneous N/A Tissue) Exposed: Yes Tendon: Yes Muscle:  Yes Fascia: No Joint: No Bone: No] [44:Tissue) Exposed: Yes Fascia: No Tendon: No Muscle: No Joint: No Bone: No] Epithelialization: [43:Small (1-33%)] [44:Small (1-33%)] [N/A:N/A] Debridement: [43:Debridement - Excisional Debridement - Excisional N/A] Pre-procedure [43:16:12] [44:16:12] [N/A:N/A] Verification/Time Out Taken: Tissue Debrided: [43:Subcutaneous, Slough] [44:Subcutaneous, Slough] [N/A:N/A] Level: [43:Skin/Subcutaneous Tissue] [44:Skin/Subcutaneous Tissue] [N/A:N/A] Debridement Area (sq cm):4 [44:1.7] [N/A:N/A] Instrument: [43:Curette] [44:Curette] [N/A:N/A] Bleeding: [43:Minimum] [44:Minimum] [N/A:N/A] Hemostasis Achieved: [43:Pressure] [44:Pressure] [N/A:N/A] Procedural Pain: [43:0] [44:0] [N/A:N/A] Post Procedural Pain: [43:0] [44:0] [N/A:N/A] Debridement Treatment Procedure was tolerated [44:Procedure was tolerated] [N/A:N/A] Response: [43:well] [44:well] Post Debridement [43:5.7x4.8x2.9] [44:1.7x1x0.2] [N/A:N/A] Measurements L x W x D (cm) Post Debridement [77:03.403] [44:0.267] [N/A:N/A] Volume: (cm) Procedures Performed: Debridement [44:Debridement] [N/A:N/A] Treatment Notes Wound #43 (Right, Medial Foot) 1. Cleanse With Wound Cleanser 3. Primary Dressing Applied Other primary dressing (specifiy in notes) 4. Secondary Dressing Dry Gauze Roll Gauze 5. Secured With Tape Notes wet-to-dry as primary dressing. Wound #44 (Right Calcaneus) 1. Cleanse With Wound Cleanser 3. Primary Dressing Applied Hydrofera Blue 4. Secondary Dressing Dry Gauze Roll Gauze Heel Cup 5. Secured With Recruitment consultant) Signed: 12/19/2018 5:53:09 PM By: Baruch Gouty RN, BSN Signed: 12/19/2018 6:09:31 PM By: Linton Ham MD Entered By: Linton Ham on 12/19/2018 17:37:17 -------------------------------------------------------------------------------- Multi-Disciplinary Care Plan Details Patient Name: Date of Service: [name], [name] 12/19/2018 3:00  PM Medical Record Number:[id number] Patient Account Number: [id number] Date of Birth/Sex: Treating RN: [birth year] (48 y.o. [name], [name] Primary Care Javaeh Muscatello: [name] Other Clinician: Referring Verina Galeno: Treating Amarah Brossman/Extender:[name], [name], [name] in Treatment: 15 Active Inactive HBO Nursing Diagnoses: Potential for barotraumas to ears, sinuses, teeth, and lungs or cerebral gas embolism related to changes in atmospheric pressure inside hyperbaric oxygen chamber Potential for oxygen toxicity seizures related to delivery of 100% oxygen at an increased atmospheric pressure Potential for pulmonary oxygen toxicity related to delivery of 100% oxygen at an increased atmospheric pressure Goals: Barotrauma will be prevented during HBO2 Date Initiated: 11/28/2018 Target Resolution Date: 12/26/2018 Goal Status: Active Patient will tolerate the hyperbaric oxygen therapy treatment Date Initiated: 11/28/2018 Target Resolution Date: 12/26/2018 Goal Status: Active Interventions: Administer decongestants, per physician orders, prior to HBO2 Administer the correct therapeutic gas delivery based on the patients needs and limitations, per physician order Assess and provide for patients comfort related to the hyperbaric environment and equalization of middle ear Assess for signs and symptoms related to adverse events, including but not limited to confinement anxiety, pneumothorax, oxygen toxicity and baurotrauma Assess patient for any history of confinement anxiety Assess patient's knowledge and expectations regarding hyperbaric medicine and provide education related to the hyperbaric environment, goals of treatment and prevention of adverse events Implement protocols to decrease risk of pneumothorax in high risk patients Notes: Wound/Skin Impairment Nursing Diagnoses: Impaired tissue integrity Knowledge deficit related to ulceration/compromised skin  integrity Goals: Patient/caregiver will verbalize understanding of skin care regimen Date Initiated: 09/03/2018 Target Resolution Date: 01/02/2019 Goal Status:  Active Ulcer/skin breakdown will have a volume reduction of 30% by week 4 Date Initiated: 09/03/2018 Date Inactivated: 10/07/2018 Target Resolution Date: 10/01/2018 Unmet Goal Status: Unmet Reason: COMORBITIES Ulcer/skin breakdown will have a volume reduction of 50% by week 8 Date Initiated: 10/07/2018 Date Inactivated: 11/03/2018 Target Resolution Date: 10/31/2018 Unmet Goal Status: Unmet Reason: Osteomyelitis Interventions: Assess patient/caregiver ability to obtain necessary supplies Assess patient/caregiver ability to perform ulcer/skin care regimen upon admission and as needed Assess ulceration(s) every visit Provide education on ulcer and skin care Treatment Activities: Skin care regimen initiated : 09/03/2018 Topical wound management initiated : 09/03/2018 Notes: Electronic Signature(s) Signed: 12/19/2018 5:53:09 PM By: Baruch Gouty RN, BSN Entered By: Baruch Gouty on 12/19/2018 16:08:34 -------------------------------------------------------------------------------- Pain Assessment Details Patient Name: Date of Service: Nancy Gauze 12/19/2018 3:00 PM Medical Record Number:[id number] Patient Account Number: [id number] Date of Birth/Sex: Treating RN: 1971-02-09 (48 y.o. [name] Primary Care Dymir Neeson: [name] Other Clinician: Referring Shandy Checo: Treating Avelynn Sellin/Extender:[name], [name], [name] in Treatment: 15 Active Problems Location of Pain Severity and Description of Pain Patient Has Paino No Site Locations Pain Management and Medication Current Pain Management: Electronic Signature(s) Signed: 01/08/2019 4:32:31 PM By: [name] Entered By: [name] on 12/19/2018  15:49:46 -------------------------------------------------------------------------------- Patient/Caregiver Education Details Patient Name: Date of Service: [name], Nancy L. 9/18/2020andnbsp3:00 PM Medical Record Number:[id number] Patient Account Number: [id number] Date of Birth/Gender: Treating RN: 09-24-1970 (48 y.o. [name] Primary Care Physician: [name] Other Clinician: Referring Physician: Treating Physician/Extender:[name], [name], [name] in Treatment: 15 Education Assessment Education Provided To: Patient Education Topics Provided Elevated Blood Sugar/ Impact on Healing: Methods: Explain/Verbal Responses: Reinforcements needed, State content correctly Hyperbaric Oxygenation: Methods: Explain/Verbal Responses: Reinforcements needed, State content correctly Wound/Skin Impairment: Methods: Explain/Verbal Responses: Reinforcements needed, State content correctly Electronic Signature(s) Signed: 12/19/2018 5:53:09 PM By: Baruch Gouty RN, BSN Entered By: Baruch Gouty on 12/19/2018 16:27:42 -------------------------------------------------------------------------------- Wound Assessment Details Patient Name: Date of Service: Nancy Gauze 12/19/2018 3:00 PM Medical Record Number:[id number] Patient Account Number: [id number] Date of Birth/Sex: Treating RN: 06/25/1970 (48 y.o. [name] Primary Care Rowe Warman: [name] Other Clinician: Referring Prabhav Faulkenberry: Treating Lyllian Gause/Extender:[name], [name], [name] in Treatment: 15 Wound Status Wound Number: 43 Primary Diabetic Wound/Ulcer of the Lower Extremity Etiology: Wound Location: Right Foot - Medial Wound Open Wounding Event: Gradually Appeared Status: Date Acquired: 08/04/2018 Date Acquired: 08/04/2018 Comorbid Cataracts, Chronic sinus problems/congestion, Weeks Of Treatment: 15 History: Anemia, Sleep Apnea, Deep Vein Thrombosis, Clustered Wound:  No Hypertension, Peripheral Arterial Disease, Type I Diabetes, Osteoarthritis, Osteomyelitis, Neuropathy, Seizure Disorder Photos Wound Measurements Length: (cm) 5.7 Width: (cm) 4.8 Depth: (cm) 2.9 Area: (cm) 21.488 Volume: (cm) 62.317 Wound Description Classification: Grade 4 Wound Margin: Distinct, outline attached Exudate Amount: Medium Exudate Type: Serosanguineous Exudate Color: red, brown Wound Bed Granulation Amount: Large (67-100%) Granulation Quality: Red, Pink Necrotic Amount: Small (1-33%) Necrotic Quality: Eschar, Adherent Slough r After Cleansing: No ibrino Yes Exposed Structure posed: No (Subcutaneous Tissue) Exposed: Yes posed: Yes posed: Yes osis of Muscle: Yes osed: No sed: No % Reduction in Area: 70.4% % Reduction in Volume: -186.2% Epithelialization: Small (1-33%) Foul Odo Slough/F Fascia Ex Fat Layer Tendon Ex Muscle Ex Necr Joint Exp Bone Expo Electronic Signature(s) Signed: 12/22/2018 9:24:03 AM By: [name] EMT/HBOT Signed: 01/08/2019 4:32:31 PM By: [name] Entered By: [name] on 12/22/2018 08:36:12 -------------------------------------------------------------------------------- Wound Assessment Details Patient Name: Date of Service: Nancy Gauze. 12/19/2018 3:00 PM Medical Record Number:[id number] Patient Account Number: [id number] Date of Birth/Sex:  Treating RN: 06-Dec-1970 (48 y.o. [name] Primary Care Amany Rando: [name] Other Clinician: Referring Avamarie Crossley: Treating Febe Champa/Extender:[name], [name], [name] in Treatment: 15 Wound Status Wound Number: 44 Primary Diabetic Wound/Ulcer of the Lower Extremity Etiology: Wound Location: Right Calcaneus Wound Open Wounding Event: Gradually Appeared Status: Date Acquired: 08/28/2018 Comorbid Cataracts, Chronic sinus problems/congestion, Weeks Of Treatment: 15 History: Anemia, Sleep Apnea, Deep Vein Thrombosis, Clustered  Wound: No Hypertension, Peripheral Arterial Disease, Type I Diabetes, Osteoarthritis, Osteomyelitis, Neuropathy, Seizure Disorder Photos Wound Measurements Length: (cm) 1.7 Width: (cm) 1 Depth: (cm) 0.2 Area: (cm) 1.335 Volume: (cm) 0.267 Wound Description Classification: Grade 2 Wound Margin: Flat and Intact Exudate Amount: Small Exudate Type: Serosanguineous Exudate Color: red, brown Wound Bed Granulation Amount: Small (1-33%) Granulation Quality: Pink Necrotic Amount: Large (67-100%) Necrotic Quality: Adherent Slough fter Cleansing: No ino Yes Exposed Structure ed: No ubcutaneous Tissue) Exposed: Yes ed: No ed: No d: No : No % Reduction in Area: 46.9% % Reduction in Volume: -6.4% Epithelialization: Small (1-33%) Tunneling: No Undermining: No Foul Odor A Slough/Fibr Fascia Expos Fat Layer (S Tendon Expos Muscle Expos Joint Expose Bone Exposed Electronic Signature(s) Signed: 12/22/2018 9:24:03 AM By: [name] EMT/HBOT Signed: 01/08/2019 4:32:31 PM By: [name] Entered By: [name] on 12/22/2018 08:36:35 -------------------------------------------------------------------------------- Vitals Details Patient Name: Date of Service: Nancy Gauze. 12/19/2018 3:00 PM Medical Record Number:[id number] Patient Account Number: [id number] Date of Birth/Sex: Treating RN: 1970/12/01 (48 y.o. [name] Primary Care Lafawn Lenoir: [name] Other Clinician: Referring Raymund Manrique: Treating Kaizen Ibsen/Extender:[name], [name], [name] in Treatment: 15 Vital Signs Time Taken: 15:48 Temperature (F): 97.9 Height (in): 67 Pulse (bpm): 85 Weight (lbs): 125 Respiratory Rate (breaths/min): 18 Body Mass Index (BMI): 19.6 Blood Pressure (mmHg): 165/96 Capillary Blood Glucose (mg/dl): 347 Reference Range: 80 - 120 mg / dl Electronic Signature(s) Signed: 01/08/2019 4:32:31 PM By: [name] Entered By: [name]  on 12/19/2018 15:49:34

## 2019-01-08 NOTE — Progress Notes (Addendum)
LISSY, DEUSER ([id number]) Visit Report for 01/08/2019 HBO Details Patient Name: Date of Service: [name], [name] 01/08/2019 1:00 PM Medical Record Number:[id number] Patient Account Number: [id number] Date of Birth/Sex: Treating RN: Jul 26, 1970 (48 y.o. [name], Meta.Reding Primary Care Shirl Ludington: [name] Other Clinician: Referring Salley Boxley: Treating Jacky Hartung/Extender:[name], [name], [name] in Treatment: 18 HBO Treatment Course Details Treatment Course Number: 4 Ordering Aristotelis Vilardi: Linton Ham Total Treatments Ordered: 50 HBO Treatment Start Date: 09/30/2018 HBO Indication: Diabetic Ulcer(s) of the Lower Extremity HBO Treatment Details Treatment Number: 63 Patient Type: Outpatient Chamber Type: Monoplace Chamber Serial #: [id number] Treatment Protocol: 2.5 ATA with 90 minutes oxygen, with two 5 minute air breaks Treatment Details Compression Rate Down: 2.0 psi / minute De-Compression Rate Up: 2.0 psi / minute Air breaks and CompressTx Pressure breathing periods DecompressDecompress Begins Reached (leave unused spaces Begins Ends blank) Chamber Pressure (ATA)1 2.5 2.5 2.5 2.5 2.5 --2.5 1 Clock Time (24 hr) 13:02 13:14 [id number]:1791:50--56:97 15:06 Treatment Length: 124 (minutes) Treatment Segments: 4 Vital Signs Capillary Blood Glucose Reference Range: 80 - 120 mg / dl HBO Diabetic Blood Glucose Intervention Range: <131 mg/dl or >249 mg/dl Time Vitals Blood Respiratory Capillary Blood Glucose Pulse Action Type: Pulse: Temperature: Taken: Pressure: Rate: Glucose (mg/dl): Meter #: Oximetry (%) Taken: Pre 13:00 110/60 99 15 98.3 172 Post 15:10 145/83 86 17 98 266 Treatment Response Treatment Toleration: Well Treatment Completion Treatment Completed without Adverse Event Status: Additional Procedure Documentation Tissue Sevierity: Necrosis of bone Aleyssa Pike Notes No concerns with treatment given Physician HBO Attestation: I certify that I  supervised this HBO treatment in accordance with Medicare guidelines. A trained Yes emergency response team is readily available per hospital policies and procedures. Continue HBOT as ordered. Yes Electronic Signature(s) Signed: 01/08/2019 5:49:31 PM By: Linton Ham MD Entered By: Linton Ham on 01/08/2019 17:47:45 -------------------------------------------------------------------------------- HBO Safety Checklist Details Patient Name: Date of Service: [name] 01/08/2019 1:00 PM Medical Record Number:[id number] Patient Account Number: [id number] Date of Birth/Sex: Treating RN: [birth year] (48 y.o. [name], Meta.Reding Primary Care Addisson Frate: [name] Other Clinician: Referring Kelyn Ponciano: Treating Tamarah Bhullar/Extender:[name], [name], [name] in Treatment: 18 HBO Safety Checklist Items Safety Checklist Consent Form Signed Patient voided / foley secured and emptied When did you last eato 1200 - meat/cheese Last dose of injectable or oral agent insulin pump NA Ostomy pouch emptied and vented if applicable All implantable devices assessed, documented and approved NA Intravenous access site secured and place Valuables secured Linens and cotton and cotton/polyester blend (less than 51% polyester) Personal oil-based products / skin lotions / body lotions removed NA Wigs or hairpieces removed NA Smoking or tobacco materials removed Books / newspapers / magazines / loose paper removed Cologne, aftershave, perfume and deodorant removed Jewelry removed (may wrap wedding band) Make-up removed Hair care products removed Battery operated devices (external) removed NA Heating patches and chemical warmers removed NA Titanium eyewear removed NA Nail polish cured greater than 10 hours NA Casting material cured greater than 10 hours NA Hearing aids removed NA Loose dentures or partials removed NA Prosthetics have been removed Patient demonstrates correct use of  air break device (if applicable) Patient concerns have been addressed Patient grounding bracelet on and cord attached to chamber Specifics for Inpatients (complete in addition to above) Medication sheet sent with patient Intravenous medications needed or due during therapy sent with patient Drainage tubes (e.g. nasogastric tube or chest tube secured and vented) Endotracheal or Tracheotomy tube secured Cuff deflated of air and inflated with saline  Airway suctioned Electronic Signature(s) Signed: 01/08/2019 1:44:03 PM By: [name] EMT/HBOT Entered By: [name] on 01/08/2019 13:44:02

## 2019-01-09 ENCOUNTER — Ambulatory Visit (HOSPITAL_COMMUNITY)
Admission: RE | Admit: 2019-01-09 | Discharge: 2019-01-09 | Disposition: A | Discharge status: Home / Self Care | Payer: Medicare HMO | Source: Ambulatory Visit | Attending: Internal Medicine | Admitting: Internal Medicine

## 2019-01-09 ENCOUNTER — Encounter (HOSPITAL_BASED_OUTPATIENT_CLINIC_OR_DEPARTMENT_OTHER): Payer: Medicare HMO | Admitting: Internal Medicine

## 2019-01-09 ENCOUNTER — Other Ambulatory Visit: Payer: Self-pay

## 2019-01-09 ENCOUNTER — Other Ambulatory Visit (HOSPITAL_COMMUNITY)
Admission: RE | Admit: 2019-01-09 | Discharge: 2019-01-09 | Disposition: A | Discharge status: Home / Self Care | Payer: Medicare HMO | Attending: Internal Medicine | Admitting: Internal Medicine

## 2019-01-09 ENCOUNTER — Other Ambulatory Visit (HOSPITAL_COMMUNITY): Payer: Self-pay | Admitting: Internal Medicine

## 2019-01-09 DIAGNOSIS — E1021 Type 1 diabetes mellitus with diabetic nephropathy: Secondary | ICD-10-CM

## 2019-01-09 DIAGNOSIS — E10621 Type 1 diabetes mellitus with foot ulcer: Secondary | ICD-10-CM | POA: Insufficient documentation

## 2019-01-09 LAB — GLUCOSE, CAPILLARY
Glucose-Capillary: 313 mg/dL — ABNORMAL HIGH (ref 70–99)
Glucose-Capillary: 314 mg/dL — ABNORMAL HIGH (ref 70–99)

## 2019-01-09 NOTE — Progress Notes (Addendum)
[name], [name] ([id number]) Visit Report for 01/09/2019 HBO Details Patient Name: Date of Service: [name], [name] 01/09/2019 1:00 PM Medical Record Number:[id number] Patient Account Number: [id number] Date of Birth/Sex: Treating RN: [birth year] (48 y.o. [name] Primary Care Chrystal Zeimet: [name] Other Clinician: Referring Jelina Paulsen: Treating Danitra Payano/Extender:[name], [name], [name] in Treatment: 18 HBO Treatment Course Details Treatment Course Number: 4 Ordering Nijah Tejera: Linton Ham Total Treatments Ordered: 50 HBO Treatment Start Date: 09/30/2018 HBO Indication: Diabetic Ulcer(s) of the Lower Extremity HBO Treatment Details Treatment Number: 64 Patient Type: Outpatient Chamber Type: Monoplace Chamber Serial #: [id number] Treatment Protocol: 2.5 ATA with 90 minutes oxygen, with two 5 minute air breaks Treatment Details Compression Rate Down: 2.0 psi / minute De-Compression Rate Up: 2.0 psi / minute Air breaks and CompressTx Pressure breathing periods DecompressDecompress Begins Reached (leave unused spaces Begins Ends blank) Chamber Pressure (ATA)1 2.5 2.5 2.5 2.5 2.5 --2.5 1 Clock Time (24 hr) 12:50 13:02 13:3213:3714:0714:12--14:42 14:54 Treatment Length: 124 (minutes) Treatment Segments: 4 Vital Signs Capillary Blood Glucose Reference Range: 80 - 120 mg / dl HBO Diabetic Blood Glucose Intervention Range: <131 mg/dl or >249 mg/dl Time Vitals Blood Respiratory Capillary Blood Glucose Pulse Action Type: Pulse: Temperature: Taken: Pressure: Rate: Glucose (mg/dl): Meter #: Oximetry (%) Taken: Pre 12:45 136/66 95 16 97.6 313 Post 14:55 148/82 89 18 98 314 Treatment Response Treatment Toleration: Well Treatment Completion Treatment Completed without Adverse Event Status: Additional Procedure Documentation Tissue Sevierity: Necrosis of bone Electronic Signature(s) Signed: 01/11/2019 10:27:00 AM By: Linton Ham MD Signed:  01/12/2019 3:43:33 PM By: [name] EMT/HBOT Entered By: [name] on 01/09/2019 15:21:40 -------------------------------------------------------------------------------- HBO Safety Checklist Details Patient Name: Date of Service: [name] 01/09/2019 1:00 PM Medical Record Number:[id number] Patient Account Number: [id number] Date of Birth/Sex: Treating RN: 05-Jan-1971 (48 y.o. [name] Primary Care Shuree Brossart: [name] Other Clinician: Referring Raydin Bielinski: Treating Keaja Reaume/Extender:[name], [name], [name] in Treatment: 18 HBO Safety Checklist Items Safety Checklist Consent Form Signed Patient voided / foley secured and emptied When did you last eato 1200 - yogurt Last dose of injectable or oral agent insulin pump NA Ostomy pouch emptied and vented if applicable NA All implantable devices assessed, documented and approved NA Intravenous access site secured and place Valuables secured Linens and cotton and cotton/polyester blend (less than 51% polyester) Personal oil-based products / skin lotions / body lotions removed NA Wigs or hairpieces removed NA Smoking or tobacco materials removed Books / newspapers / magazines / loose paper removed Cologne, aftershave, perfume and deodorant removed Jewelry removed (may wrap wedding band) Make-up removed Hair care products removed Battery operated devices (external) removed NA Heating patches and chemical warmers removed NA Titanium eyewear removed NA Nail polish cured greater than 10 hours NA Casting material cured greater than 10 hours NA Hearing aids removed NA Loose dentures or partials removed NA Prosthetics have been removed Patient demonstrates correct use of air break device (if applicable) Patient concerns have been addressed Patient grounding bracelet on and cord attached to chamber Specifics for Inpatients (complete in addition to above) Medication sheet sent with  patient Intravenous medications needed or due during therapy sent with patient Drainage tubes (e.g. nasogastric tube or chest tube secured and vented) Endotracheal or Tracheotomy tube secured Cuff deflated of air and inflated with saline Airway suctioned Electronic Signature(s) Signed: 01/09/2019 1:50:53 PM By: [name] EMT/HBOT Entered By: [name] on 01/09/2019 13:50:53

## 2019-01-11 NOTE — Progress Notes (Signed)
[name], [name] ([id number]) Visit Report for 01/07/2019 Problem List Details Patient Name: Date of Service: [name], [name] 01/07/2019 1:00 PM Medical Record Number:[id number] Patient Account Number: [id number] Date of Birth/Sex: Treating RN: [birth year] (48 y.o. 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 E10.621 Type 1 diabetes mellitus with foot ulcer L02.415 Cutaneous abscess of right lower limb Facility Procedures CPT4 Code Description: [id number] G0277-(Facility Use Only) HBOT, full body chamber, 37min Modifier: Quantity: 4 Physician  Procedures CPT4 Code Description: [id number] - WC PHYS HYPERBARIC OXYGEN THERAPY ICD-10 Diagnosis Description E10.621 Type 1 diabetes mellitus with foot ulcer Modifier: Quantity: 1 Electronic Signature(s) Signed: 01/11/2019 10:37:03 PM By: Worthy Keeler PA-C Previous Signature: 01/08/2019 1:38:24 PM Version By: [name] EMT/HBOT Entered By: Worthy Keeler on 01/11/2019 22:35:02

## 2019-01-12 ENCOUNTER — Encounter (HOSPITAL_BASED_OUTPATIENT_CLINIC_OR_DEPARTMENT_OTHER): Payer: Medicare HMO | Admitting: Internal Medicine

## 2019-01-12 ENCOUNTER — Other Ambulatory Visit: Payer: Self-pay

## 2019-01-12 DIAGNOSIS — E10621 Type 1 diabetes mellitus with foot ulcer: Secondary | ICD-10-CM | POA: Diagnosis not present

## 2019-01-12 LAB — GLUCOSE, CAPILLARY
Glucose-Capillary: 290 mg/dL — ABNORMAL HIGH (ref 70–99)
Glucose-Capillary: 261 mg/dL — ABNORMAL HIGH (ref 70–99)

## 2019-01-12 NOTE — Progress Notes (Signed)
TEKA, CHANDA ([id number]) Visit Report for 01/12/2019 Arrival Information Details Patient Name: Date of Service: TYRICA, AFZAL 01/12/2019 1:00 PM Medical Record Number:[id number] Patient Account Number: [id number] Date of Birth/Sex: Treating RN: Aug 29, 1970 (48 y.o. Nancy Fetter Primary Care Crislyn Willbanks: [name] Other Clinician: Referring Tagan Bartram: Treating Enslee Bibbins/Extender:[name], [name], [name] in Treatment: 18 Visit Information History Since Last Visit Added or deleted any medications: No Patient Arrived: Wheel Chair Any new allergies or adverse reactions: No Arrival Time: 13:10 Had a fall or experienced change in No activities of daily living that may affect Accompanied By: self risk of falls: Transfer Assistance: None Signs or symptoms of abuse/neglect since last No Patient Identification Verified: Yes visito Secondary Verification Process Completed: Yes Hospitalized since last visit: No Patient Requires Transmission-Based No Implantable device outside of the clinic excluding No Precautions: cellular tissue based products placed in the center Patient Has Alerts: No since last visit: Pain Present Now: No Electronic Signature(s) Signed: 01/12/2019 3:43:33 PM By: [name] EMT/HBOT Entered By: [name] on 01/12/2019 13:28:12 -------------------------------------------------------------------------------- Encounter Discharge Information Details Patient Name: Date of Service: [name] 01/12/2019 1:00 PM Medical Record Number:[id number] Patient Account Number: [id number] Date of Birth/Sex: Treating RN: Feb 09, 1971 (48 y.o. Nancy Fetter Primary Care Takeem Krotzer: [name] Other Clinician: Referring Richanda Darin: Treating Burnie Therien/Extender:[name], [name], [name] in Treatment: 75 Encounter Discharge Information Items Discharge Condition: Stable Ambulatory Status: Wheelchair Discharge Destination:  Home Transportation: Private Auto Accompanied By: self Schedule Follow-up Appointment: Yes Clinical Summary of Care: Patient Declined Electronic Signature(s) Signed: 01/12/2019 3:43:33 PM By: [name] EMT/HBOT Entered By: [name] on 01/12/2019 15:43:14 -------------------------------------------------------------------------------- Patient/Caregiver Education Details Margorie John 10/12/2020andnbsp1:00 Patient Name: Date of Service: L. PM Medical Record Patient Account Number: [id number] Number: Treating RN: [name] Date of Birth/Gender: [birth year] (48 y.o. F) Other Clinician: Primary Care Physician:MOREIRA, [name] Treating Linton Ham Referring Physician: Physician/Extender: Juanito Doom in Treatment: 49 Education Assessment Education Provided To: Patient Education Topics Provided Hyperbaric Oxygenation: Methods: Explain/Verbal Responses: State content correctly Electronic Signature(s) Signed: 01/12/2019 3:43:33 PM By: [name] EMT/HBOT Entered By: [name] on 01/12/2019 15:43:02 -------------------------------------------------------------------------------- Vitals Details Patient Name: Date of Service: [name] 01/12/2019 1:00 PM Medical Record Number:[id number] Patient Account Number: [id number] Date of Birth/Sex: Treating RN: September 05, 1970 (48 y.o. Nancy Fetter Primary Care Lindamarie Maclachlan: [name] Other Clinician: Referring Rivaan Kendall: Treating Salsabeel Gorelick/Extender:[name], [name], [name] in Treatment: 18 Vital Signs Time Taken: 13:15 Temperature (F): 97.8 Height (in): 67 Pulse (bpm): 94 Weight (lbs): 125 Respiratory Rate (breaths/min): 19 Body Mass Index (BMI): 19.6 Blood Pressure (mmHg): 149/74 Capillary Blood Glucose (mg/dl): 290 Reference Range: 80 - 120 mg / dl Electronic Signature(s) Signed: 01/12/2019 3:43:33 PM By: [name] EMT/HBOT Entered By: [name]  on 01/12/2019 13:28:36

## 2019-01-12 NOTE — Progress Notes (Signed)
Nancy, [name] ([id number]) Visit Report for 01/08/2019 Arrival Information Details Patient Name: Date of Service: [name], [name] 01/08/2019 1:00 PM Medical Record Number:[id number] Patient Account Number: [id number] Date of Birth/Sex: Treating RN: Nov 15, 1970 (48 y.o. [name], Meta.Reding Primary Care Jesusmanuel Erbes: [name] Other Clinician: Referring Mazie Fencl: Treating Demya Scruggs/Extender:[name], [name], [name] in Treatment: 18 Visit Information History Since Last Visit Added or deleted any medications: No Patient Arrived: Wheel Chair Any new allergies or adverse reactions: No Arrival Time: 12:55 Had a fall or experienced change in No activities of daily living that may affect Accompanied By: self risk of falls: Transfer Assistance: None Signs or symptoms of abuse/neglect since last No Patient Identification Verified: Yes visito Secondary Verification Process Completed: Yes Hospitalized since last visit: No Patient Requires Transmission-Based No Implantable device outside of the clinic excluding No Precautions: cellular tissue based products placed in the center Patient Has Alerts: No since last visit: Pain Present Now: No Electronic Signature(s) Signed: 01/12/2019 3:43:33 PM By: [name] EMT/HBOT Entered By: [name] on 01/08/2019 13:43:00 -------------------------------------------------------------------------------- Encounter Discharge Information Details Patient Name: Date of Service: [name] 01/08/2019 1:00 PM Medical Record Number:[id number] Patient Account Number: [id number] Date of Birth/Sex: Treating RN: 1971/01/06 (48 y.o. [name] Primary Care Katleen Carraway: [name] Other Clinician: Referring Sedona Wenk: Treating Achilles Neville/Extender:[name], [name], [name] in Treatment: 36 Encounter Discharge Information Items Discharge Condition: Stable Ambulatory Status: Wheelchair Discharge Destination:  Home Transportation: Private Auto Accompanied By: self Schedule Follow-up Appointment: Yes Clinical Summary of Care: Patient Declined Electronic Signature(s) Signed: 01/12/2019 3:43:33 PM By: [name] EMT/HBOT Entered By: [name] on 01/08/2019 16:01:48 Patient/Caregiver Education Details Date of Service: -------------------------------------------------------------------------------- Margorie John 10/8/2020andnbsp1:00 PM Patient Name: L. Patient Account Number: [id number] Medical Record Treating RN: Deon Pilling [id number] Number: Other Clinician: Date of Birth/Gender: 1970-08-25 (48 y.o. F) Treating Linton Ham Primary Care Physician: [name] Physician/Extender: Referring Physician: Juanito Doom in Treatment: 14 Education Assessment Education Provided To: Patient Education Topics Provided Hyperbaric Oxygenation: Methods: Explain/Verbal Responses: State content correctly Electronic Signature(s) Signed: 01/12/2019 3:43:33 PM By: [name] EMT/HBOT Entered By: [name] on 01/08/2019 16:01:34 -------------------------------------------------------------------------------- Vitals Details Patient Name: Date of Service: [name] 01/08/2019 1:00 PM Medical Record Number:[id number] Patient Account Number: [id number] Date of Birth/Sex: Treating RN: [birth year] (48 y.o. [name], Meta.Reding Primary Care Hilman Kissling: [name] Other Clinician: Referring Jacob Chamblee: Treating Sherma Vanmetre/Extender:[name], [name], [name] in Treatment: 18 Vital Signs Time Taken: 13:00 Temperature (F): 98.3 Height (in): 67 Pulse (bpm): 99 Weight (lbs): 125 Respiratory Rate (breaths/min): 15 Body Mass Index (BMI): 19.6 Blood Pressure (mmHg): 110/60 Capillary Blood Glucose (mg/dl): 172 Reference Range: 80 - 120 mg / dl Electronic Signature(s) Signed: 01/12/2019 3:43:33 PM By: [name] EMT/HBOT Entered By: [name] on  01/08/2019 13:43:15

## 2019-01-12 NOTE — Progress Notes (Signed)
[name], [name] ([id number]) Visit Report for 01/12/2019 SuperBill Details Patient Name: Date of Service: [name], [name] 01/12/2019 Medical Record [id number] Patient Account Number: [id number] Date of Birth/Sex: Treating RN: Aug 16, 1970 (48 y.o. 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

## 2019-01-12 NOTE — Progress Notes (Signed)
KASSADEE, CARAWAN ([id number]) Visit Report for 01/09/2019 Arrival Information Details Patient Name: Date of Service: TANIQUE, MATNEY 01/09/2019 1:00 PM Medical Record Number:[id number] Patient Account Number: [id number] Date of Birth/Sex: Treating RN: 31-Oct-1970 (48 y.o. Nancy Fetter Primary Care Abilene Mcphee: [name] Other Clinician: Referring Carley Glendenning: Treating Phu Record/Extender:[name], [name], [name] in Treatment: 18 Visit Information History Since Last Visit Added or deleted any medications: No Patient Arrived: Wheel Chair Any new allergies or adverse reactions: No Arrival Time: 12:40 Had a fall or experienced change in No activities of daily living that may affect Accompanied By: self risk of falls: Transfer Assistance: None Signs or symptoms of abuse/neglect since last No Patient Identification Verified: Yes visito Secondary Verification Process Completed: Yes Hospitalized since last visit: No Patient Requires Transmission-Based No Implantable device outside of the clinic excluding No Precautions: cellular tissue based products placed in the center Patient Has Alerts: No since last visit: Pain Present Now: No Electronic Signature(s) Signed: 01/12/2019 3:43:33 PM By: [name] EMT/HBOT Entered By: [name] on 01/09/2019 13:49:55 -------------------------------------------------------------------------------- Encounter Discharge Information Details Patient Name: Date of Service: [name] 01/09/2019 1:00 PM Medical Record Number:[id number] Patient Account Number: [id number] Date of Birth/Sex: Treating RN: Apr 18, 1970 (48 y.o. Nancy Fetter Primary Care Yamir Carignan: [name] Other Clinician: Referring Lanecia Sliva: Treating Nox Talent/Extender:[name], [name], [name] in Treatment: 56 Encounter Discharge Information Items Discharge Condition: Stable Ambulatory Status: Wheelchair Discharge Destination:  Home Transportation: Private Auto Accompanied By: self Schedule Follow-up Appointment: Yes Clinical Summary of Care: Patient Declined Electronic Signature(s) Signed: 01/12/2019 3:43:33 PM By: [name] EMT/HBOT Entered By: [name] on 01/09/2019 15:21:13 Patient/Caregiver Education Details Date of Service: -------------------------------------------------------------------------------- Margorie John 10/9/2020andnbsp1:00 PM Patient Name: L. Patient Account Number: [id number] Medical Record Treating RN: [name] [id number] Number: Other Clinician: Date of Birth/Gender: 26-Nov-1970 (49 y.o. F) Treating Linton Ham Primary Care Physician: [name] Physician/Extender: Referring Physician: Juanito Doom in Treatment: 63 Education Assessment Education Provided To: Patient Education Topics Provided Hyperbaric Oxygenation: Methods: Explain/Verbal Responses: State content correctly Electronic Signature(s) Signed: 01/12/2019 3:43:33 PM By: [name] EMT/HBOT Entered By: [name] on 01/09/2019 15:20:59 -------------------------------------------------------------------------------- Vitals Details Patient Name: Date of Service: [name] 01/09/2019 1:00 PM Medical Record Number:[id number] Patient Account Number: [id number] Date of Birth/Sex: Treating RN: Nov 28, 1970 (48 y.o. Nancy Fetter Primary Care Tahesha Skeet: [name] Other Clinician: Referring Brynnleigh Mcelwee: Treating Dalbert Stillings/Extender:[name], [name], [name] in Treatment: 18 Vital Signs Time Taken: 12:45 Temperature (F): 97.6 Height (in): 67 Pulse (bpm): 95 Weight (lbs): 125 Respiratory Rate (breaths/min): 16 Body Mass Index (BMI): 19.6 Blood Pressure (mmHg): 136/66 Capillary Blood Glucose (mg/dl): 313 Reference Range: 80 - 120 mg / dl Electronic Signature(s) Signed: 01/12/2019 3:43:33 PM By: [name] EMT/HBOT Entered By: [name] on  01/09/2019 13:50:17

## 2019-01-12 NOTE — Progress Notes (Addendum)
[name], [name] ([id number]) Visit Report for 01/12/2019 HBO Details Patient Name: Date of Service: [name], [name] 01/12/2019 1:00 PM Medical Record Number:[id number] Patient Account Number: [id number] Date of Birth/Sex: Treating RN: [birth year] (49 y.o. Nancy Fetter Primary Care Nidya Bouyer: [name] Other Clinician: Referring Odyn Turko: Treating Jacora Hopkins/Extender:[name], [name], [name] in Treatment: 18 HBO Treatment Course Details Treatment Course Number: 4 Ordering Alyaan Budzynski: Linton Ham Total Treatments Ordered: 50 HBO Treatment Start Date: 09/30/2018 HBO Indication: Diabetic Ulcer(s) of the Lower Extremity HBO Treatment Details Treatment Number: 65 Patient Type: Outpatient Chamber Type: Monoplace Chamber Serial #: [id number] Treatment Protocol: 2.5 ATA with 90 minutes oxygen, with two 5 minute air breaks Treatment Details Compression Rate Down: 2.0 psi / minute De-Compression Rate Up: 2.0 psi / minute Air breaks and CompressTx Pressure breathing periods DecompressDecompress Begins Reached (leave unused spaces Begins Ends blank) Chamber Pressure (ATA)1 2.5 2.5 2.5 2.5 2.5 --2.5 1 Clock Time (24 hr) 13:20 13:32 29:5188:4166:0630:16--01:09 15:24 Treatment Length: 124 (minutes) Treatment Segments: 4 Vital Signs Capillary Blood Glucose Reference Range: 80 - 120 mg / dl HBO Diabetic Blood Glucose Intervention Range: <131 mg/dl or >249 mg/dl Time Vitals Blood Respiratory Capillary Blood Glucose Pulse Action Type: Pulse: Temperature: Taken: Pressure: Rate: Glucose (mg/dl): Meter #: Oximetry (%) Taken: Pre 13:15 149/74 94 19 97.8 290 Post 15:27 168/86 87 16 98.3 261 Treatment Response Treatment Toleration: Well Treatment Completion Treatment Completed without Adverse Event Status: Additional Procedure Documentation Tissue Sevierity: Necrosis of bone Electronic Signature(s) Signed: 01/12/2019 3:43:33 PM By: [name]  EMT/HBOT Signed: 01/12/2019 5:50:55 PM By: Linton Ham MD Entered By: [name] on 01/12/2019 15:42:34 -------------------------------------------------------------------------------- HBO Safety Checklist Details Patient Name: Date of Service: [name] 01/12/2019 1:00 PM Medical Record Number:[id number] Patient Account Number: [id number] Date of Birth/Sex: Treating RN: October 24, 1970 (48 y.o. Nancy Fetter Primary Care Deshawn Witty: [name] Other Clinician: Referring Jahmez Bily: Treating Aleesia Henney/Extender:[name], [name], [name] in Treatment: 18 HBO Safety Checklist Items Safety Checklist Consent Form Signed Patient voided / foley secured and emptied When did you last eato 1200 - lean [employer] Last dose of injectable or oral agent insulin pump NA Ostomy pouch emptied and vented if applicable All implantable devices assessed, documented and approved NA Intravenous access site secured and place Valuables secured Linens and cotton and cotton/polyester blend (less than 51% polyester) Personal oil-based products / skin lotions / body lotions removed NA Wigs or hairpieces removed NA Smoking or tobacco materials removed Books / newspapers / magazines / loose paper removed Cologne, aftershave, perfume and deodorant removed Jewelry removed (may wrap wedding band) Make-up removed Hair care products removed Battery operated devices (external) removed NA Heating patches and chemical warmers removed NA Titanium eyewear removed NA Nail polish cured greater than 10 hours NA Casting material cured greater than 10 hours NA Hearing aids removed NA Loose dentures or partials removed NA Prosthetics have been removed Patient demonstrates correct use of air break device (if applicable) Patient concerns have been addressed Patient grounding bracelet on and cord attached to chamber Specifics for Inpatients (complete in addition to above) Medication sheet sent  with patient Intravenous medications needed or due during therapy sent with patient Drainage tubes (e.g. nasogastric tube or chest tube secured and vented) Endotracheal or Tracheotomy tube secured Cuff deflated of air and inflated with saline Airway suctioned Electronic Signature(s) Signed: 01/12/2019 1:29:37 PM By: [name] EMT/HBOT Entered By: [name] on 01/12/2019 13:29:36

## 2019-01-12 NOTE — Progress Notes (Signed)
[name], [name] ([id number]) Visit Report for 01/08/2019 SuperBill Details Patient Name: Date of Service: [name], [name] 01/08/2019 Medical Record Number:[id number] Patient Account Number: [id number] Date of Birth/Sex: Treating RN: [birth year] (48 y.o. [name], Meta.Reding Primary Care Provider: [name] Other Clinician: Referring Provider: Treating Provider/Extender:Dorothea Yow, [name], [name] in Treatment: 18 Diagnosis Coding ICD-10 Codes Code Description [phone] Other chronic osteomyelitis, right ankle and foot L97.514 Non-pressure chronic ulcer of other part of right foot with necrosis of bone E10.621 Type 1 diabetes mellitus with foot ulcer L02.415 Cutaneous abscess of right lower limb Facility Procedures CPT4 Code Description Modifier Quantity [id number] G0277-(Facility Use Only) HBOT, full body chamber, 51min 4 Physician Procedures CPT4 Code Description Modifier Quantity [id number] 57972 - WC PHYS HYPERBARIC OXYGEN THERAPY 1 ICD-10 Diagnosis Description E10.621 Type 1 diabetes mellitus with foot ulcer Electronic Signature(s) Signed: 01/08/2019 5:49:31 PM By: Linton Ham MD Signed: 01/12/2019 3:43:33 PM By: [name] EMT/HBOT Entered By: [name] on 01/08/2019 16:01:20

## 2019-01-12 NOTE — Progress Notes (Signed)
VICKY, SCHLEICH ([id number]) Visit Report for 01/09/2019 SuperBill Details Patient Name: Date of Service: [name], [name] 01/09/2019 Medical Record Number:[id number] Patient Account Number: [id number] Date of Birth/Sex: Treating RN: [birth year] (48 y.o. Nancy Fetter Primary Care Provider: [name] Other Clinician: Referring Provider: Treating Provider/Extender:[name], [name], [name] in Treatment: 18 Diagnosis Coding ICD-10 Codes Code Description [phone] Other chronic osteomyelitis, right ankle and foot L97.514 Non-pressure chronic ulcer of other part of right foot with necrosis of bone E10.621 Type 1 diabetes mellitus with foot ulcer L02.415 Cutaneous abscess of right lower limb Facility Procedures CPT4 Code Description Modifier Quantity [id number] G0277-(Facility Use Only) HBOT, full body chamber, 70min 4 Physician Procedures CPT4 Code Description Modifier Quantity [id number] 83437 - WC PHYS HYPERBARIC OXYGEN THERAPY 1 ICD-10 Diagnosis Description E10.621 Type 1 diabetes mellitus with foot ulcer Electronic Signature(s) Signed: 01/11/2019 10:27:00 AM By: Linton Ham MD Signed: 01/12/2019 3:43:33 PM By: [name] EMT/HBOT Entered By: [name] on 01/09/2019 15:20:41

## 2019-01-13 ENCOUNTER — Encounter (HOSPITAL_BASED_OUTPATIENT_CLINIC_OR_DEPARTMENT_OTHER): Payer: Medicare HMO | Admitting: Internal Medicine

## 2019-01-13 DIAGNOSIS — E10621 Type 1 diabetes mellitus with foot ulcer: Secondary | ICD-10-CM | POA: Diagnosis not present

## 2019-01-13 LAB — AEROBIC CULTURE W GRAM STAIN (SUPERFICIAL SPECIMEN): Gram Stain: NONE SEEN

## 2019-01-13 LAB — GLUCOSE, CAPILLARY
Glucose-Capillary: 114 mg/dL — ABNORMAL HIGH (ref 70–99)
Glucose-Capillary: 187 mg/dL — ABNORMAL HIGH (ref 70–99)

## 2019-01-13 NOTE — Progress Notes (Signed)
[name], [name] ([id number]) Visit Report for 01/09/2019 Arrival Information Details Patient Name: Date of Service: [name], [name] 01/09/2019 3:00 PM Medical Record Number:[id number] Patient Account Number: [id number] Date of Birth/Sex: Treating RN: [birth year] (48 y.o. [name], Meta.Reding Primary Care Seanpatrick Maisano: [name] Other Clinician: Referring Kevia Zaucha: Treating Lynnett Langlinais/Extender:[name], [name], [name] in Treatment: 18 Visit Information History Since Last Visit Added or deleted any medications: No Patient Arrived: Wheel Chair Any new allergies or adverse reactions: No Arrival Time: 15:13 Had a fall or experienced change in No activities of daily living that may affect Accompanied By: self risk of falls: Transfer Assistance: None Signs or symptoms of abuse/neglect since last No Patient Identification Verified: Yes visito Secondary Verification Process Completed: Yes Hospitalized since last visit: No Patient Requires Transmission-Based No Implantable device outside of the clinic excluding No Precautions: cellular tissue based products placed in the center Patient Has Alerts: No since last visit: Has Dressing in Place as Prescribed: Yes Pain Present Now: No Electronic Signature(s) Signed: 01/09/2019 6:01:54 PM By: Deon Pilling Entered By: Deon Pilling on 01/09/2019 15:13:55 -------------------------------------------------------------------------------- Clinic Level of Care Assessment Details Patient Name: Date of Service: [name] 01/09/2019 3:00 PM Medical Record Number:[id number] Patient Account Number: [id number] Date of Birth/Sex: Treating RN: [birth year] (48 y.o. [name] Primary Care Kahmari Herard: [name] Other Clinician: Referring Jevon Shells: Treating [name]/Extender:[name], [name], [name] in Treatment: 18 Clinic Level of Care Assessment Items TOOL 4 Quantity Score X - Use when only an EandM is  performed on FOLLOW-UP visit 1 0 ASSESSMENTS - Nursing Assessment / Reassessment X - Reassessment of Co-morbidities (includes updates in patient status) 1 10 X - Reassessment of Adherence to Treatment Plan 1 5 ASSESSMENTS - Wound and Skin Assessment / Reassessment []  - Simple Wound Assessment / Reassessment - one wound 0 X - Complex Wound Assessment / Reassessment - multiple wounds 3 5 []  - Dermatologic / Skin Assessment (not related to wound area) 0 ASSESSMENTS - Focused Assessment []  - Circumferential Edema Measurements - multi extremities 0 []  - Nutritional Assessment / Counseling / Intervention 0 X - Lower Extremity Assessment (monofilament, tuning fork, pulses) 1 5 []  - Peripheral Arterial Disease Assessment (using hand held doppler) 0 ASSESSMENTS - Ostomy and/or Continence Assessment and Care []  - Incontinence Assessment and Management 0 []  - Ostomy Care Assessment and Management (repouching, etc.) 0 PROCESS - Coordination of Care X - Simple Patient / Family Education for ongoing care 1 15 []  - Complex (extensive) Patient / Family Education for ongoing care 0 X - Staff obtains Programmer, systems, Records, Test Results / Process Orders 1 10 X - Staff telephones HHA, Nursing Homes / Clarify orders / etc 1 10 []  - Routine Transfer to another Facility (non-emergent condition) 0 []  - Routine Hospital Admission (non-emergent condition) 0 []  - New Admissions / Biomedical engineer / Ordering NPWT, Apligraf, etc. 0 []  - Emergency Hospital Admission (emergent condition) 0 X - Simple Discharge Coordination 1 10 []  - Complex (extensive) Discharge Coordination 0 PROCESS - Special Needs []  - Pediatric / Minor Patient Management 0 []  - Isolation Patient Management 0 []  - Hearing / Language / Visual special needs 0 []  - Assessment of Community assistance (transportation, D/C planning, etc.) 0 []  - Additional assistance / Altered mentation 0 []  - Support Surface(s) Assessment (bed, cushion, seat,  etc.) 0 INTERVENTIONS - Wound Cleansing / Measurement []  - Simple Wound Cleansing - one wound 0 X - Complex Wound Cleansing - multiple wounds 3 5 X - Wound  Imaging (photographs - any number of wounds) 1 5 []  - Wound Tracing (instead of photographs) 0 []  - Simple Wound Measurement - one wound 0 X - Complex Wound Measurement - multiple wounds 3 5 INTERVENTIONS - Wound Dressings []  - Small Wound Dressing one or multiple wounds 0 X - Medium Wound Dressing one or multiple wounds 1 15 []  - Large Wound Dressing one or multiple wounds 0 X - Application of Medications - topical 1 5 []  - Application of Medications - injection 0 INTERVENTIONS - Miscellaneous []  - External ear exam 0 X - Specimen Collection (cultures, biopsies, blood, body fluids, etc.) 1 5 X - Specimen(s) / Culture(s) sent or taken to Lab for analysis 1 5 []  - Patient Transfer (multiple staff / Civil Service fast streamer / Similar devices) 0 []  - Simple Staple / Suture removal (25 or less) 0 []  - Complex Staple / Suture removal (26 or more) 0 []  - Hypo / Hyperglycemic Management (close monitor of Blood Glucose) 0 []  - Ankle / Brachial Index (ABI) - do not check if billed separately 0 X - Vital Signs 1 5 Has the patient been seen at the hospital within the last three years: Yes Total Score: 150 Level Of Care: New/Established - Level 4 Electronic Signature(s) Signed: 01/13/2019 5:52:09 PM By: [name] RN, BSN Entered By: [name] on 01/09/2019 16:48:03 -------------------------------------------------------------------------------- Encounter Discharge Information Details Patient Name: Date of Service: [name] 01/09/2019 3:00 PM Medical Record Number:[id number] Patient Account Number: [id number] Date of Birth/Sex: Treating RN: 02-05-1971 (48 y.o. [name] Primary Care Giovany Cosby: [name] Other Clinician: Referring Tarick Parenteau: Treating Mandeep Ferch/Extender:[name], [name], [name] in  Treatment: 81 Encounter Discharge Information Items Discharge Condition: Stable Ambulatory Status: Wheelchair Discharge Destination: Home Transportation: Other Accompanied By: self Schedule Follow-up Appointment: Yes Clinical Summary of Care: Patient Declined Electronic Signature(s) Signed: 01/09/2019 5:13:27 PM By: [name] Entered By: [name] on 01/09/2019 16:25:25 -------------------------------------------------------------------------------- Lower Extremity Assessment Details Patient Name: Date of Service: [name] 01/09/2019 3:00 PM Medical Record Number:[id number] Patient Account Number: [id number] Date of Birth/Sex: Treating RN: 29-May-1970 (47 y.o. [name] Primary Care Merrisa Skorupski: [name] Other Clinician: Referring Jamika Sadek: Treating Jerzey Komperda/Extender:[name], [name], [name] in Treatment: 18 Edema Assessment Assessed: [Left: No] [Right: Yes] Edema: [Left: Ye] [Right: s] Calf Left: Right: Point of Measurement: cm From Medial Instep cm 26 cm Ankle Left: Right: Point of Measurement: cm From Medial Instep cm 21 cm Vascular Assessment Pulses: Dorsalis Pedis Palpable: [Right:No] Electronic Signature(s) Signed: 01/09/2019 6:01:54 PM By: Deon Pilling Entered By: Deon Pilling on 01/09/2019 15:14:49 -------------------------------------------------------------------------------- Multi Wound Chart Details Patient Name: Date of Service: [name] 01/09/2019 3:00 PM Medical Record Number:[id number] Patient Account Number: [id number] Date of Birth/Sex: Treating RN: 1971-03-05 (48 y.o. [name] Primary Care Lewis Keats: [name] Other Clinician: Referring Van Seymore: Treating Adison Jerger/Extender:[name], [name], [name] in Treatment: 18 Vital Signs Height(in): 57 Capillary Blood 314 Glucose(mg/dl): Weight(lbs): 125 Pulse(bpm): 37 Body Mass Index(BMI): 20 Blood Pressure(mmHg):  144/73 Temperature(F): 98 Respiratory 14 Rate(breaths/min): Photos: [43:No Photos] [44:No Photos] [46:No Photos] Wound Location: [43:Right Foot - Medial] [44:Right Calcaneus] [46:Right Lower Leg - Lateral] Wounding Event: [43:Gradually Appeared] [44:Gradually Appeared] [46:Other Lesion] 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] [44:Lower Extremity] Comorbid History: [43:Cataracts, Chronic sinus Cataracts, Chronic sinus Cataracts, Chronic sinus problems/congestion, Anemia, Sleep Apnea, Deep Anemia, Sleep Apnea, Deep Anemia, Sleep Apnea, Deep Vein Thrombosis, Hypertension, Peripheral Hypertension,  Peripheral Hypertension, Peripheral Arterial Disease, Type I  Diabetes, Osteoarthritis, Osteomyelitis, Neuropathy, Osteomyelitis, Neuropathy, Osteomyelitis, Neuropathy, Seizure Disorder] [44:problems/congestion, Vein Thrombosis, Arterial Disease, Type I  Diabetes, Osteoarthritis, Seizure Disorder] [46:problems/congestion, Vein Thrombosis, Arterial Disease, Type I Diabetes, Osteoarthritis, Seizure Disorder] Date Acquired: [43:08/04/2018] [44:08/28/2018] [46:12/25/2018] Weeks of Treatment: [43:18] [44:18] [46:2] Wound Status: [43:Open] [44:Open] [46:Open] Measurements L x W x D 6.5x3.5x2.6 [44:0.3x0.3x0.1] [46:0.5x1.2x0.2] (cm) Area (cm) : [43:17.868] [44:0.071] [46:0.471] Volume (cm) : [43:46.456] [44:0.007] [46:0.094] % Reduction in Area: [43:75.40%] [44:97.20%] [46:-496.20%] % Reduction in Volume: -113.40% [44:97.20%] [46:-19.00%] Position 1 (o'clock): 11 Maximum Distance 1 [43:3.8] (cm): Tunneling: [43:Yes] [44:No] [46:No] Classification: [43:Grade 4] [44:Grade 2] [46:Full Thickness Without Exposed Support Structures] Exudate Amount: [43:Medium] [44:Small] [46:Medium] Exudate Type: [43:Serosanguineous] [44:Serosanguineous] [46:Serosanguineous] Exudate Color: [43:red, brown] [44:red, brown] [46:red, brown] Wound Margin: [43:Distinct,  outline attached] [44:Flat and Intact] [46:Distinct, outline attached] Granulation Amount: [43:Large (67-100%)] [44:Large (67-100%)] [46:Large (67-100%)] Granulation Quality: [43:Red, Pink] [44:Red] [46:Red, Pink] Necrotic Amount: [43:Small (1-33%)] [44:None Present (0%)] [46:Small (1-33%)] Exposed Structures: [43:Fat Layer (Subcutaneous Tissue) Exposed: Yes Tendon: Yes Muscle: Yes Fascia: No Joint: No Bone: No Small (1-33%)] [44:Fat Layer (Subcutaneous Tissue) Exposed: Yes Fascia: No Tendon: No Muscle: No Joint: No Bone: No Medium (34-66%)] [46:Fat Layer  (Subcutaneous Tissue) Exposed: Yes Fascia: No Tendon: No Muscle: No Joint: No Bone: No Medium (34-66%)] Treatment Notes Wound #43 (Right, Medial Foot) 1. Cleanse With Wound Cleanser Soap and water 2. Periwound Care Skin Prep 3. Primary Dressing Applied Calcium Alginate Ag 4. Secondary Dressing ABD Pad Dry [name] Roll [name] 5. Secured With Tape Notes kerlix roll. patient did not want to use heel cup Wound #44 (Right Calcaneus) 1. Cleanse With Wound Cleanser Soap and water 2. Periwound Care Skin Prep 3. Primary Dressing Applied Calcium Alginate Ag 4. Secondary Dressing ABD Pad Dry [name] Roll [name] 5. Secured With Tape Notes kerlix roll. patient did not want to use heel cup Wound #46 (Right, Lateral Lower Leg) 1. Cleanse With Wound Cleanser Soap and water 2. Periwound Care Skin Prep 3. Primary Dressing Applied Calcium Alginate Ag 4. Secondary Dressing ABD Pad Dry [name] Roll [name] 5. Secured With Tape Notes kerlix roll. patient did not want to use heel cup Electronic Signature(s) Signed: 01/11/2019 10:27:00 AM By: Linton Ham MD Signed: 01/13/2019 5:52:09 PM By: [name] RN, BSN Entered By: Linton Ham on 01/11/2019 08:40:30 -------------------------------------------------------------------------------- Multi-Disciplinary Care Plan Details Patient Name: Date of Service: [name]  01/09/2019 3:00 PM Medical Record Number:[id number] Patient Account Number: [id number] Date of Birth/Sex: Treating RN: Jan 29, 1971 (48 y.o. [name] Primary Care Deyana Wnuk: [name] Other Clinician: Referring Keanna Tugwell: Treating Levana Minetti/Extender:[name], [name], [name] in Treatment: 73 Active Inactive HBO Nursing Diagnoses: Potential for barotraumas to ears, sinuses, teeth, and lungs or cerebral gas embolism related to changes in atmospheric pressure inside hyperbaric oxygen chamber Potential for oxygen toxicity seizures related to delivery of 100% oxygen at an increased atmospheric pressure Potential for pulmonary oxygen toxicity related to delivery of 100% oxygen at an increased atmospheric pressure Goals: Barotrauma will be prevented during HBO2 Date Initiated: 11/28/2018 Target Resolution Date: 01/30/2019 Goal Status: Active Patient will tolerate the hyperbaric oxygen therapy treatment Date Initiated: 11/28/2018 Target Resolution Date: 01/30/2019 Goal Status: Active Interventions: Administer decongestants, per physician orders, prior to HBO2 Administer the correct therapeutic gas delivery based on the patients needs and limitations, per physician order Assess and provide for patients comfort related to the hyperbaric environment and equalization of middle ear Assess for signs and symptoms related to adverse events, including but not limited to confinement anxiety,  pneumothorax, oxygen toxicity and baurotrauma Assess patient for any history of confinement anxiety Assess patient's knowledge and expectations regarding hyperbaric medicine and provide education related to the hyperbaric environment, goals of treatment and prevention of adverse events Implement protocols to decrease risk of pneumothorax in high risk patients Notes: Wound/Skin Impairment Nursing Diagnoses: Impaired tissue integrity Knowledge deficit related to ulceration/compromised skin  integrity Goals: Patient/caregiver will verbalize understanding of skin care regimen Date Initiated: 09/03/2018 Target Resolution Date: 02/27/2019 Goal Status: Active Ulcer/skin breakdown will have a volume reduction of 30% by week 4 Date Initiated: 09/03/2018 Date Inactivated: 10/07/2018 Target Resolution Date: 10/01/2018 Unmet Goal Status: Unmet Reason: COMORBITIES Ulcer/skin breakdown will have a volume reduction of 50% by week 8 Date Initiated: 10/07/2018 Date Inactivated: 11/03/2018 Target Resolution Date: 10/31/2018 Unmet Goal Status: Unmet Reason: Osteomyelitis Interventions: Assess patient/caregiver ability to obtain necessary supplies Assess patient/caregiver ability to perform ulcer/skin care regimen upon admission and as needed Assess ulceration(s) every visit Provide education on ulcer and skin care Treatment Activities: Skin care regimen initiated : 09/03/2018 Topical wound management initiated : 09/03/2018 Notes: Electronic Signature(s) Signed: 01/13/2019 5:52:09 PM By: [name] RN, BSN Entered By: [name] on 01/09/2019 15:58:23 -------------------------------------------------------------------------------- Pain Assessment Details Patient Name: Date of Service: [name] 01/09/2019 3:00 PM Medical Record Number:[id number] Patient Account Number: [id number] Date of Birth/Sex: Treating RN: [birth year] (48 y.o. [name] Primary Care Shaindy Reader: [name] Other Clinician: Referring Labrittany Wechter: Treating Antuan Limes/Extender:[name], [name], [name] in Treatment: 18 Active Problems Location of Pain Severity and Description of Pain Patient Has Paino No Site Locations Rate the pain. Current Pain Level: 0 Pain Management and Medication Current Pain Management: Medication: No Cold Application: No Rest: No Massage: No Activity: No T.E.N.S.: No Heat Application: No Leg drop or elevation: No Is the Current Pain Management Adequate:  Adequate How does your wound impact your activities of daily livingo Sleep: No Bathing: No Appetite: No Relationship With Others: No Bladder Continence: No Emotions: No Bowel Continence: No Work: No Toileting: No Drive: No Dressing: No Hobbies: No Electronic Signature(s) Signed: 01/09/2019 6:01:54 PM By: Deon Pilling Entered By: Deon Pilling on 01/09/2019 15:14:32 -------------------------------------------------------------------------------- Patient/Caregiver Education Details Patient Name: Date of Service: [name] 10/9/2020andnbsp3:00 PM Medical Record Number:[id number] Patient Account Number: [id number] Date of Birth/Gender: Treating RN: 1971-03-14 (48 y.o. [name] Primary Care Physician: [name] Other Clinician: Referring Physician: Treating Physician/Extender:[name], [name], [name] in Treatment: 48 Education Assessment Education Provided To: Patient Education Topics Provided Wound/Skin Impairment: Methods: Explain/Verbal Responses: State content correctly Electronic Signature(s) Signed: 01/13/2019 5:52:09 PM By: [name] RN, BSN Entered By: [name] on 01/09/2019 15:58:35 -------------------------------------------------------------------------------- Wound Assessment Details Patient Name: Date of Service: [name] 01/09/2019 3:00 PM Medical Record Number:[id number] Patient Account Number: [id number] Date of Birth/Sex: Treating RN: [birth year] (48 y.o. [name], Meta.Reding Primary Care Ivann Trimarco: [name] Other Clinician: Referring Edgard Debord: Treating Sharene Krikorian/Extender:[name], [name], [name] in Treatment: 18 Wound Status Wound Number: 43 Primary Diabetic Wound/Ulcer of the Lower Extremity Etiology: Wound Location: Right Foot - Medial Wound Open Wounding Event: Gradually Appeared Status: Date Acquired: 08/04/2018 Comorbid Cataracts, Chronic sinus problems/congestion, Weeks Of  Treatment: 18 History: Anemia, Sleep Apnea, Deep Vein Thrombosis, Clustered Wound: No Hypertension, Peripheral Arterial Disease, Type I Diabetes, Osteoarthritis, Osteomyelitis, Neuropathy, Seizure Disorder Photos Wound Measurements Length: (cm) 6.5 Width: (cm) 3.5 Depth: (cm) 2.6 Area: (cm) 17.868 Volume: (cm) 46.456 Wound Description Classification: Grade 4 Wound Margin: Distinct, outline attached Exudate Amount: Medium Exudate Type: Serosanguineous Exudate  Color: red, brown Wound Bed Granulation Amount: Large (67-100%) Granulation Quality: Red, Pink Necrotic Amount: Small (1-33%) Necrotic Quality: Adherent Slough Odor After Cleansing: No h/Fibrino Yes Exposed Structure Exposed: No yer (Subcutaneous Tissue) Exposed: Yes Exposed: Yes Exposed: Yes ecrosis of Muscle: Yes Exposed: No xposed: No % Reduction in Area: 75.4% % Reduction in Volume: -113.4% Epithelialization: Small (1-33%) Tunneling: Yes Position (o'clock): 11 Maximum Distance: (cm) 3.8 Undermining: No Foul Sloug Fascia Fat La Tendon Muscle N Joint Bone E Treatment Notes Wound #43 (Right, Medial Foot) 1. Cleanse With Wound Cleanser Soap and water 2. Periwound Care Skin Prep 3. Primary Dressing Applied Calcium Alginate Ag 4. Secondary Dressing ABD Pad Dry [name] Roll [name] 5. Secured With Tape Notes kerlix roll. patient did not want to use heel cup Electronic Signature(s) Signed: 01/12/2019 3:43:33 PM By: [name] EMT/HBOT Signed: 01/12/2019 6:01:15 PM By: Deon Pilling Previous Signature: 01/09/2019 6:01:54 PM Version By: Deon Pilling Entered By: [name] on 01/12/2019 09:01:27 -------------------------------------------------------------------------------- Wound Assessment Details Patient Name: Date of Service: [name] 01/09/2019 3:00 PM Medical Record Number:[id number] Patient Account Number: [id number] Date of Birth/Sex: Treating RN: [birth year] (48 y.o.  [name], Meta.Reding Primary Care Shemeika Starzyk: [name] Other Clinician: Referring Kassadee Carawan: Treating Olyver Hawes/Extender:[name], [name], [name] in Treatment: 18 Wound Status Wound Number: 44 Primary Diabetic Wound/Ulcer of the Lower Extremity Etiology: Wound Location: Right Calcaneus Wound Open Wounding Event: Gradually Appeared Status: Date Acquired: 08/28/2018 Comorbid Cataracts, Chronic sinus problems/congestion, Weeks Of Treatment: 18 History: Anemia, Sleep Apnea, Deep Vein Thrombosis, Clustered Wound: No Hypertension, Peripheral Arterial Disease, Type I Diabetes, Osteoarthritis, Osteomyelitis, Neuropathy, Seizure Disorder Photos Wound Measurements Length: (cm) 0.3 % Reduction in Are Width: (cm) 0.3 % Reduction in Vol Depth: (cm) 0.1 Epithelialization: Area: (cm) 0.071 Tunneling: Volume: (cm) 0.007 Undermining: Wound Description Classification: Grade 2 Foul Odor After Cl Wound Margin: Flat and Intact Slough/Fibrino Exudate Amount: Small Exudate Type: Serosanguineous Exudate Color: red, brown Wound Bed Granulation Amount: Large (67-100%) Granulation Quality: Red Fascia Exposed: Necrotic Amount: None Present (0%) Fat Layer (Subcuta Tendon Exposed: Muscle Exposed: Joint Exposed: Bone Exposed: eansing: No Yes Exposed Structure No neous Tissue) Exposed: Yes No No No No a: 97.2% ume: 97.2% Medium (34-66%) No No Treatment Notes Wound #44 (Right Calcaneus) 1. Cleanse With Wound Cleanser Soap and water 2. Periwound Care Skin Prep 3. Primary Dressing Applied Calcium Alginate Ag 4. Secondary Dressing ABD Pad Dry [name] Roll [name] 5. Secured With Tape Notes kerlix roll. patient did not want to use heel cup Electronic Signature(s) Signed: 01/12/2019 3:43:33 PM By: [name] EMT/HBOT Signed: 01/12/2019 6:01:15 PM By: Deon Pilling Previous Signature: 01/09/2019 6:01:54 PM Version By: Deon Pilling Entered By: [name] on  01/12/2019 09:03:21 -------------------------------------------------------------------------------- Wound Assessment Details Patient Name: Date of Service: [name] 01/09/2019 3:00 PM Medical Record Number:[id number] Patient Account Number: [id number] Date of Birth/Sex: Treating RN: Feb 15, 1971 (48 y.o. [name] Primary Care Madalen Gavin: [name] Other Clinician: Referring Alyn Riedinger: Treating Dennies Coate/Extender:[name], [name], [name] in Treatment: 18 Wound Status Wound Number: 46 Primary Abscess Etiology: Wound Location: Right Lower Leg - Lateral Wound Open Wounding Event: Other Lesion Status: Date Acquired: 12/25/2018 Notes: MD IandD today. Weeks Of Treatment: 2 Comorbid Cataracts, Chronic sinus problems/congestion, Clustered Wound: No History: Anemia, Sleep Apnea, Deep Vein Thrombosis, Hypertension, Peripheral Arterial Disease, Type I Diabetes, Osteoarthritis, Osteomyelitis, Neuropathy, Seizure Disorder Photos Wound Measurements Length: (cm) 0.5 % Redu Width: (cm) 1.2 % Redu Depth: (cm) 0.2 Epithe Area: (cm) 0.471 Tunne Volume: (cm) 0.094 Under  Wound Description Classification: Full Thickness Without Exposed Support Structures Wound Wound Distinct, outline attached Margin: Exudate Medium Amount: Exudate Serosanguineous Type: Exudate red, brown Color: Wound Bed Granulation Amount: Large (67-100%) Granulation Quality: Red, Pink Necrotic Amount: Small (1-33%) Foul Odor After Cleansing: No Slough/Fibrino Yes Exposed Structure Fascia Exposed: No Fat Layer (Subcutaneous Tissue) Exposed: Yes Tendon Exposed: No Muscle Exposed: No Joint Exposed: No Bone Exposed: No ction in Area: -496.2% ction in Volume: -19% lialization: Medium (34-66%) ling: No mining: No Treatment Notes Wound #46 (Right, Lateral Lower Leg) 1. Cleanse With Wound Cleanser Soap and water 2. Periwound Care Skin Prep 3. Primary Dressing  Applied Calcium Alginate Ag 4. Secondary Dressing ABD Pad Dry [name] Roll [name] 5. Secured With Tape Notes kerlix roll. patient did not want to use heel cup Electronic Signature(s) Signed: 01/12/2019 3:43:33 PM By: [name] EMT/HBOT Signed: 01/12/2019 6:01:15 PM By: Deon Pilling Previous Signature: 01/09/2019 6:01:54 PM Version By: Deon Pilling Entered By: [name] on 01/12/2019 09:00:58 -------------------------------------------------------------------------------- Vitals Details Patient Name: Date of Service: [name] 01/09/2019 3:00 PM Medical Record Number:[id number] Patient Account Number: [id number] Date of Birth/Sex: Treating RN: 10-Jul-1970 (48 y.o. [name], Meta.Reding Primary Care Viviano Bir: [name] Other Clinician: Referring Brannen Koppen: Treating Malvina Schadler/Extender:[name], [name], [name] in Treatment: 18 Vital Signs Time Taken: 15:15 Temperature (F): 98 Height (in): 67 Pulse (bpm): 57 Weight (lbs): 125 Respiratory Rate (breaths/min): 14 Body Mass Index (BMI): 19.6 Blood Pressure (mmHg): 144/73 Capillary Blood Glucose (mg/dl): 314 Reference Range: 80 - 120 mg / dl Electronic Signature(s) Signed: 01/09/2019 6:01:54 PM By: Deon Pilling Entered By: Deon Pilling on 01/09/2019 15:14:21

## 2019-01-13 NOTE — Progress Notes (Addendum)
[name], [name] ([id number]) Visit Report for 01/13/2019 HBO Details Patient Name: Date of Service: [name], [name] 01/13/2019 1:00 PM Medical Record Number:[id number] Patient Account Number: [id number] Date of Birth/Sex: Treating RN: 04-20-1970 (48 y.o. [name] Primary Care Tika Hannis: [name] Other Clinician: Referring Geoffrey Hynes: Treating Hortence Charter/Extender:[name], [name], [name] in Treatment: 18 HBO Treatment Course Details Treatment Course Number: 4 Ordering Raylynn Hersh: Linton Ham Total Treatments Ordered: 50 HBO Treatment Start Date: 09/30/2018 HBO Indication: Diabetic Ulcer(s) of the Lower Extremity HBO Treatment Details Treatment Number: 66 Patient Type: Outpatient Chamber Type: Monoplace Chamber Serial #: [id number] Treatment Protocol: 2.5 ATA with 90 minutes oxygen, with two 5 minute air breaks Treatment Details Compression Rate Down: 2.0 psi / minute De-Compression Rate Up: 2.0 psi / minute Air breaks and CompressTx Pressure breathing periods DecompressDecompress Begins Reached (leave unused spaces Begins Ends blank) Chamber Pressure (ATA)1 2.5 2.5 2.5 2.5 2.5 --2.5 1 Clock Time (24 hr) 13:08 13:20 13:5013:5514:2514:30--15:00 15:12 Treatment Length: 124 (minutes) Treatment Segments: 4 Vital Signs Capillary Blood Glucose Reference Range: 80 - 120 mg / dl HBO Diabetic Blood Glucose Intervention Range: <131 mg/dl or >249 mg/dl Time Vitals Blood Respiratory Capillary Blood Glucose Pulse Action Type: Pulse: Temperature: Taken: Pressure: Rate: Glucose (mg/dl): Meter #: Oximetry (%) Taken: Pre 13:00 143/75 84 16 97.9 130 Post 15:15 167/98 91 15 98.2 187 Treatment Response Treatment Toleration: Well Treatment Completion Treatment Completed without Adverse Event Status: Additional Procedure Documentation Tissue Sevierity: Necrosis of bone Analeigh Aries Notes No concerns with treatment given Physician HBO Attestation: I certify that  I supervised this HBO treatment in accordance with Medicare guidelines. A trained Yes emergency response team is readily available per hospital policies and procedures. Continue HBOT as ordered. Yes Electronic Signature(s) Signed: 01/13/2019 5:42:33 PM By: Linton Ham MD Entered By: Linton Ham on 01/13/2019 17:40:13 -------------------------------------------------------------------------------- HBO Safety Checklist Details Patient Name: Date of Service: [name] 01/13/2019 1:00 PM Medical Record Number:[id number] Patient Account Number: [id number] Date of Birth/Sex: Treating RN: 1970-10-23 (48 y.o. [name] Primary Care Hakan Nudelman: [name] Other Clinician: Referring Dwana Garin: Treating Terryl Niziolek/Extender:[name], [name], [name] in Treatment: 18 HBO Safety Checklist Items Safety Checklist Consent Form Signed Patient voided / foley secured and emptied When did you last eato 1200 - roast beef sammy Last dose of injectable or oral agent insulin pump NA Ostomy pouch emptied and vented if applicable All implantable devices assessed, documented and approved NA Intravenous access site secured and place Valuables secured Linens and cotton and cotton/polyester blend (less than 51% polyester) Personal oil-based products / skin lotions / body lotions removed NA Wigs or hairpieces removed NA Smoking or tobacco materials removed Books / newspapers / magazines / loose paper removed Cologne, aftershave, perfume and deodorant removed Jewelry removed (may wrap wedding band) Make-up removed Hair care products removed Battery operated devices (external) removed NA Heating patches and chemical warmers removed NA Titanium eyewear removed NA Nail polish cured greater than 10 hours NA Casting material cured greater than 10 hours NA Hearing aids removed NA Loose dentures or partials removed NA Prosthetics have been removed Patient demonstrates  correct use of air break device (if applicable) Patient concerns have been addressed Patient grounding bracelet on and cord attached to chamber Specifics for Inpatients (complete in addition to above) Medication sheet sent with patient Intravenous medications needed or due during therapy sent with patient Drainage tubes (e.g. nasogastric tube or chest tube secured and vented) Endotracheal or Tracheotomy tube secured Cuff deflated of air and inflated  with saline Airway suctioned Electronic Signature(s) Signed: 01/13/2019 1:27:16 PM By: [name] EMT/HBOT Entered By: [name] on 01/13/2019 13:27:15

## 2019-01-13 NOTE — Progress Notes (Signed)
DAURICE, OVANDO ([id number]) Visit Report for 01/09/2019 HPI Details Patient Name: Date of Service: [name], [name] 01/09/2019 3:00 PM Medical Record Number:[id number] Patient Account Number: [id number] Date of Birth/Sex: Treating RN: 01-12-1971 (48 y.o. Nancy Fetter Primary Care Provider: [name] Other Clinician: Referring Provider: Treating Provider/Extender:Florabelle Cardin, [name], [name] in Treatment: 18 History of Present Illness HPI Description: 48 year old diabetic who is known to have type 1 diabetes which is poorly controlled last hemoglobin A1c was 11%. She comes in with a ulcerated area on the left lateral foot which has been there for over 6 months. Was recently she has been treated by [name] of podiatry who saw her last on 05/28/2016. Review of his notes revealed that the patient had incision and drainage with placement of antibiotic beads to the left foot on 04/11/2016 for possible osteomyelitis of the cuboid bone. Over the last year she's had a history of amputation of the left fifth toe and a femoropopliteal popliteal bypass graft somewhere in April 2017. 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 His note [name] mentions that the patient has been referred to me for further wound care and possibly great candidate for hyperbaric oxygen therapy due to recurrent osteomyelitis. However we do not have any x-rays of biopsy reports confirming this. He has been on several antibiotics including Bactrim and most recently is on doxycycline for an MRSA. I understand, the patient was not a candidate for IV antibiotics as she has had previous PICC lines which resulted in blood clots in both arms. There was a x-ray report dated 04/04/2016 on [name] notes which showed evidence of fifth ray resection left foot with osteolytic changes noted to the fourth metatarsal and cuboid bone on the left. 06/13/2016 -- had a left foot x-ray which showed no  acute fracture or dislocation and no definite radiographic evidence of osteomyelitis. Advanced osteopenia was seen. 06/20/2016 -- she has noticed a new wound on the right plantar foot in the region where she had a callus before. 06/27/16- the patient did have her x-ray of the right foot which showed no findings to suggest osteomyelitis. She saw her endocrinologist, [name], yesterday. Her A1c in January was 11. He also indicates mismanagement and noncompliance regarding her diabetes. She is currently on Bactrim for a lip infection. She is complaining of nausea, vomiting and diarrhea. She is unable to articulate the exact orders or dosing of the Bactrim; it is unclear when she will complete this. 07/04/2016 -- results from [facility] of ABIs with ankle waveforms were noted from 02/14/2016. The examination done on 06/27/2015 showed noncompressible ABIs with the right being 1.45 and the left being 1.33. The present examination showed a right ABI of 1.19 on the left of 1.33. The conclusion was that right normal ABI in the lower extremity at rest however compared to previous study which was noncompressible ABI may be falsely elevated side suggesting medial calcification. The left ABI suggested medial calcification. 08/01/2016 -- the patient had more redness and pain on her right foot and did not get to come to see as noted she see her PCP or go to the ER and decided to take some leftover metronidazole which she had at home. As usual, the patient does report she feels and is rather noncompliant. 08/08/2016 -- -- x-ray of the right foot -- FINDINGS:Transmetatarsal amputation is noted. No bony destruction is noted to suggest osteomyelitis. IMPRESSION: No evidence of osteomyelitis. Postsurgical changes are seen. MRI would be more sensitive  for possible bony changes. Culture has grown Serratia Marcescens -- sensitive to Bactrim, ciprofloxacin, ceftazidime she was seen by [name] on 08/06/2016.  He did not find any exposed bone, muscle, tendon, ligament or joint. There was no malodor and he did a excisional debridement in the office. ============ Old notes: 48 year old patient who is known to the wound clinic for a while had been away from the wound clinic since 09/01/2014. Over the last several months she has been admitted to various hospitals including [facility]. She was treated for a right metatarsal osteomyelitis with a transmetatarsal amputation and this was done about 2 months ago. He has a small ulcerated area on the right heel and she continues to have an ulcerated area on the left plantar aspect of the foot. The patient was recently admitted to the [facility] hospital group between 7/12 and 10/18/2014. she was given 3 weeks of IV vancomycin and was to follow-up with her surgeons at [facility] and also took oral vancomycin for C. difficile colitis. 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 MRI of the left foot done on 10/14/2014 showed no abscess or osteomyelitis. 04/27/15; this is a patient we know from previous stays in the wound care center. She is a type I diabetic I am not sure of her control currently. Since the last time I saw her she is had a right transmetatarsal amputation and has no wounds on her right foot and has no open wounds. She is been followed at the wound care center at [facility] in [place]. She comes today with the desire to undergo hyperbaric treatment locally. Apparently one of her wound care providers in [place] has suggested hyperbarics. This is in response to an MRI from 04/18/15 that showed increased marrow signal and loss of the proximal fifth  metatarsal cortex evidence of osteomyelitis with likely early osteomyelitis in the cuboid bone as well. She has a large wound over the base of the fifth metatarsal. She also has a eschar over her the tips of her toes on 1,3 and 5. She does not have peripheral pulses and apparently is going for an angiogram tomorrow which seems reasonable. After this she is going to infectious disease at [facility]. They have been using Medihoney to the large wound on the lateral aspect of the left foot to. The patient has known Charcot deformity from diabetic neuropathy. She also has known diabetic PAD. Surprisingly I can't see that she has had any recent antibiotics, the patient states the last antibiotic she had was at the end of November for 10 days. I think this was in response to culture that showed group G strep although I'm not exactly sure where the culture was from. She is also had arterial studies on 03/29/15. This showed a right ABI of 1.4 that was noncompressible. Her left ABI was 0.73. 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 It was not felt that arterial inflow was adequate for healing of a foot ulcer. Her Doppler waveforms looked monophasic ===== READMISSION 02/28/17; this is in an now 48 year old woman we've had at several different occasions in this clinic. 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 She has a remote ex-smoker. She was last seen in this  clinic by Dr. Con Memos I think in May. More recently she is been followed by her podiatrist [name] an infectious disease Dr. Megan Salon. She has 2 open wounds the major one is over the right first metatarsal head she also has a wound on the left plantar foot. an MRI of the right foot on 01/01/17 showed a soft tissue ulcer along the plantar aspect of the first metatarsal base consistent with osteomyelitis of the first metatarsal stump. Dr. 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 According to the patient this is been open for slightly over a month. She has been on a combination of Cipro 500 twice a day, Zyvox 600 twice a day and Flagyl 500 3 times a day for over a month now as directed by Dr. Megan Salon. cultures of the right foot earlier this year showed MRSA in January and Serratia in May. January also had a few viridans strep. Recent x-rays of both feet were done and [name] office and I don't have these reports. The patient has known PAD and has a history of aleft femoropopliteal bypass in April 2017. 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 Dwyane Dee. In spite of this the last hemoglobin A1c I can see is 10.1 on 01/01/2017. She is being referred by 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 She is been using Medihoney to this area and also an area on the left midfoot. She is using healing sandals bilaterally. ABIs in this clinic at the left posterior tibial was 1.1 noncompressible on the right READMISSION Non invasive vascular NOVANT 5/18 Aftercare following surgery of the circulatory system Procedure Note - Interface, External Ris In - 08/13/2016 11:05 AM EDT Procedure: Examination consists of physiologic resting arterial pressures of the brachial and ankle arteries bilaterally with continuous wave Doppler waveform analysis. Previous: Previous exam performed on 02/14/16 demonstrated ABIs of Rt = 1.19 and Lt = 1.33. Right: ABI = non-compressible PT, 1.47 DP. S/P transmet amputation. Left: ABI = 1.52, 2nd digit pressure = 87 mmHg Conclusions: Right: ABI (>1.3) may be falsely elevated, suggesting medial calcification. Left: ABI (>1.3) may be falsely  elevated, suggesting medial calcification The patient is a now 48 year old type I diabetic is had multiple issues her graded to chronic diabetic foot ulcers. 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 We had her in the clinic lastin November. At that point she had wounds on her bilateral feet.she had wanted to try hyperbarics however the healogics review process denied her because she hadn't followed up with her vascular surgeon for her left femoropopliteal bypass. The bypass was done by Dr. Raul Del at [facility]. We made her a follow-up with Dr. Raul Del however she did not keep the appointment and therefore she was not approved The patient shows me a small wound on her left fourth metatarsal head on her phone. She developed rapid discoloration in the plantar aspect of the left foot and she was admitted to hospital from 2/2 through 05/10/17 with wet gangrene of the left foot osteomyelitis of the fourth metatarsal heads. She was admitted acutely ill with a temperature of 103. She was started on broad-spectrum vancomycin and cefepime. On 05/06/17  she was taken to the OR by [name] her podiatric surgeon for an incision and drainage irrigation of the left foot wound. Cultures from this surgery revealed group be strep and anaerobes. she was seen by Dr.Xu of orthopedic surgery and scheduled for a below-knee amputation which she u refused. Ultimately she was discharged on Levaquin and Flagyl for one month. MRI 05/05/17 done while she was in the hospital showed abscess adjacent to the fourth metatarsal head and neck small abscess around the fourth flexor tendon. Inflammatory phlegmon and gas in the soft tissues along the lateral aspect of the fourth phalanx. Findings worrisome for osteomyelitis involving the fourth proximal and middle phalanx and also the third and fourth metatarsals. Finally the patient had actually shortly  before this followed up with Dr. Raul Del at no time on 04/29/17. He felt that her left femoropopliteal bypass was patent he felt that her left-sided toe pressures more than adequate for healing a wound on the left foot. This was before her acute presentation. 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 05/28/17; she is started hyperbarics. The patient tells me that for some reason she was not actually on Levaquin but I think on ciprofloxacin. She was on Flagyl. She only started her Levaquin yesterday due to some difficulty with the pharmacy and perhaps her sister picking it up. She has an appointment with [name] tomorrow and with infectious disease early next week. She has no new complaints 06/06/17; the patient continues in hyperbarics. She saw [name] on 05/29/17 who is her podiatric surgeon. He is elected for a transmetatarsal amputation on 06/27/17. I'm not sure at what level he plans to do this amputation. The patient is unaware She also saw Dr. Megan Salon of infectious disease who elected to continue her on current antibiotics I think this is ciprofloxacin and Flagyl. I'll need to clarify with her tomorrow if she actually has this. We're using silver alginate to the actual wound. Necrotic surface today with material under the flap of her foot. Original MRI showed abscesses as well as osteomyelitis of the proximal and middle fourth phalanx and the third and fourth metatarsal heads 06/11/17; patient continues in hyperbarics and continues on oral antibiotics. She is doing well. The wound looks better. The necrotic part of this under the flap in her superior foot also looks better. she is been to see [name]. I haven't had a chance to look at his note. Apparently he has put the transmetatarsal amputation on hold her request it is still planning to take her to the OR for debridement and product application ACEL. I'll see if I can find his note. I'll therefore leave product ordering/requests to Dr.  [name] for now. I was going to look at Dermagraft 06/18/17-she is here in follow-up evaluation for bilateral foot wounds. She continues with hyperbaric therapy. She states she has been applying manuka honey to the right plantar foot and alternate manuka honey and silver alginate to the left foot, despite our orders. We will continue with same treatment plan and she will follo up next week. 06/25/17; I have reviewed [name] last note from 3/11. She has operative debridement in 2 days' time. By review his note apparently they're going to place there is skin over the majority of this wound which is a good choice. She has a small satellite area at the most proximal part of this wound on the left plantar foot. The area on the right plantar foot we've been using silver alginate and it is  close to healing. 07/02/17; unfortunately the patient was not easily approved for [name] proposed surgery. I'm not completely certain what the issue is. She has been using silver alginate to the wound she has completed a first course of hyperbarics. She is still on Levaquin and Flagyl. I have really lost track of the time course here.I suspect she should have another week to 2 of antibiotics. I'll need to see if she is followed up with infectious disease Dr. Megan Salon 07/09/17; the patient is followed up with Dr. Megan Salon. 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 She continues on Levaquin and metronidazole continuing both of these for now I think she is been on fr about 6 weeks. She still has some drainage but no pain. No fever. Her had been plans for her to go to the OR for operative debridement with her podiatrist [name], I am not exactly sure where that is. I'll probably slip a note to [name] today. I note that she follows with Dr. Dwyane Dee of endocrinology. We have her recertified for hyperbaric oxygen. I have not heard about Dermagraft however I'll see if [name] is  planning a skin substitute as well 07/16/17; the patient tells me she is just about out of Blades. I'll need to check Dr. Hale Bogus last notes on this. She states she has plenty of Flagyl however. She comes in today complaining of pain in the right lateral foot which she said lasted for about a day. The wound on the right foot is actually much more medially. She also tells me that the [facility] cost a lot of pain in the left foot wound and she turned back to silver alginate. Finally Dermagraft has a $742 per application co-pay. She cannot afford this 07/23/17; patient arrives today with the wound not much smaller. There is not much new to add. She has not heard from [name] all try to put in a call to them today. She was asking about Dermagraft again and she has an over $595 per application co-pay she states that she would be willing to try to do a payment plan. I been tried to avoid this. We've been using silver alginate, I'll change to [facility] 07/30/17-She is here in follow-up evaluation for left foot ulcer. She continues hyperbaric medicine. The left foot ulcer is stable we will continue with same treatment plan 08/06/17; she is here for evaluation of her left foot ulcer. Currently being treated for hyperbarics or underlying osteomyelitis. She is completed antibiotics. The left foot ulcer is better smaller with healthier looking granulation. For various reasons I am not really clear on we never got her back to the OR with [name]. He did not respond to my secure text message. Nevertheless I think that surgery on this point is not necessary nor am I completely clear that a skin substitute is necessary The patient is complaining about pain on the outside of her right foot. 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 There is no erythema. She also states the foot is warm versus her other part of her upper leg and this is largely true. It is not totally clear to me what's  causing this. She thinks it's different from her usual neuropathy pain 08/13/17; she arrives in clinic today with a small wound which is superficial on her right first metatarsal head. 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 She tells [place] she was up on  her feet over the Mother's Day celebration. The large wound is on the left foot. Continues with hyperbarics for underlying osteomyelitis. We're using Hydrofera Blue. She asked me today about where we were with Dermagraft. I had actually excluded this because of the co- pay however she wants to assume this therefore I'll recheck the co-pay an order for next week. 08/20/17; the patient agreed to accept the co-pay of the first Apligraf which we applied today. She is disappointed she is finishing hyperbarics will run this through the insurance on the extent of the foot infection and the extent of the wound that she had however she is already had 60 dive's. Dermagraft No. 1 08/27/17; Dermagraft No. 2. She is not eligible for any more hyperbaric treatments this month. She reports a fair amount of drainage and she actually changed to the external dressings without disturbing the direct contact layer 09/03/17; the patient arrived in clinic today with the wound superficially looking quite healthy. Nice vibrant red tissue with some advancing epithelialization although not as much adherence of the flap as I might like. However she noted on her own fourth toe some bogginess and she brought that to our attention. Indeed this was boggy feeling like a possibility of subcutaneous fluid. 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 She is not been unwell. We've been using Dermagraft 09/10/17; the culture that I did not last week was MRSA. She saw Dr. Megan Salon this morning who is going to start her on vancomycin. I had sent him a secure a text message yesterday. I also spoke with  her podiatric surgeon [name] about surgery on this foot the options for conserving a functional foot etc. Promised me he would see her and will make back consultation today. Paradoxically her actual wound on the plantar aspect of her left foot looks really quite good. I had given her 5 days worth of Baxdella to cover her for MRSA. Her MRI came back showing osteomyelitis within the third metatarsal shaft and head and base of the third and fourth proximal phalanx. She had extensive inflammatory changes throughout the soft tissue of the lateral forefoot. With an ill-defined fluid around the fourth metatarsal extending into the plantar and dorsal soft tissues 09/19/17; the patient is actually on oral Septra and Flagyl. She apparently refused IV vancomycin. She also saw [name] at my request who is planning her for a left BKA sometime in mid July. MRI showed osteomyelitis within the third metatarsal shaft and head and the basis of the third and fourth proximal phalanx. I believe there was felt to be possible septic arthritis involving the third MTP. 09/26/17; the patient went back to Dr. Megan Salon at my suggestion and is now receiving IV daptomycin. Her wound continues to look quite good making the decision to proceed with a transmetatarsal amputation although more difficult for the patient. I believe in my extensive discussions with her she has a good sense of the pros and cons of this. I don't NV the tuft decision she has to make. She has an appointment with [name] I believe in mid July and I previously spoken to him about this issue Has we had used 3 previous Dermagraft. Given the condition of the wound surface I went ahead and added the fourth one today area and I did this not fully realizing that she'll be traveling to [place] next week. I'm hopeful she can come  back in 2 weeks 10/21/17; Her same Dermagraft on for about 3-1/2 weeks. In spite of this the wound arrives looking quite  healthy. There is been a lot of healing dimensions are smaller. Looking at the square shaped wound she has now there is some undermining and some depth medially under the undermining although I cannot palpate any bone. No surrounding infection is obvious. She has difficult questions about how to look at this going forward vis--vis amputations versus continued medical therapy. To be truthful the wound is looks so healthy and it is continued to contract. Hard to justify foot surgery at this point although I still told her that I think it might come to that if we are not able to eradicate the underlying MRSA. She is still highly at risk and she understands this 11/06/17 on evaluation today patient appears to be doing better in regard to her foot ulcer. She's been tolerating the dressing changes without complication. Currently she is here for her Dermagraft #6. Her wound continues to make excellent progress at this point. She does not appear to have any evidence of infection which is good news. 11/13/17 on evaluation today patient appears to be doing excellent at this time. She is here for repeat Dermagraft application. This is #7. Overall her wound seems to be making great progress. 12/05/17; the patient arrives with the wound in much better condition than when I last saw this almost 6 weeks ago. She still has a small probing area in the left metatarsal head region on the lateral aspect of her foot. We applied her last Dermagraft today. Since the last time she is here she has what appears to a been a blood blister on the plantar aspect of left foot although I don't see this is threatening. There is also a thick raised tissue on the right mid metatarsal head region. This was not there I don't think the last time she was here 3 weeks ago. 12/12/17; the patient continues to have a small programming area in the left metatarsal head region on the lateral aspect of her foot which was the initial large surgical  wound. I applied her last Apligraf last week. I'm going to use Endoform starting today Unfortunately she has an excoriated area in the left mid foot and the right mid foot. The left midfoot looks like a blistered area this was not opened last week it certainly is open today. Using silver alginate on these areas. She promises me she is offloading this. 12/19/17; the small probing area in the left metatarsal head eyes think is shallower. In general her original wound looks better. We've been using Endoform. The area inferiorly that I think was trauma last week still requires debridement a lot of nonviable surface which I removed. She still has an open open area distally in her foot Similarly on the right foot there is tightly adherent surface debris which I removed. Still areas that don't look completely epithelialized. This is a small open area. We used silver alginate on these areas 12/26/2017; the patient did not have the supplies we ordered from last week including the Endoform. The original large wound on the left lateral foot looks healthy. She still has the undermining area that is largely unchanged from last week. She has the same heavily callused raised edged wounds on the right mid and left midfoot. Both of these requiring debridement. We have been using silver alginate on these areas 01/02/2018; there is still supply issues. We are going to try  to use Prisma but I am not sure she actually got it from what she is saying. She has a new open area on the lateral aspect of the left fourth toe [previous fifth ray amputation]. Still the one tunneling area over the fourth metatarsal head. The area is in the midfoot bilaterally still have thick callus around them. She is concerned about a raised swelling on the lateral aspect of the foot. However she is completely insensate 01/10/2018; we are using Prisma to the wounds on her bilateral feet. Surprisingly the tunneling area over the left fourth  metatarsal head that was part of her original surgery has closed down. She has a small open area remaining on the incision line. 2 open areas in the midfoot. 02/10/2018; the patient arrives back in clinic after a month hiatus. She was traveling to visit family in [place]. Is fairly clear she was not offloading the areas on her feet. The original wound over the left lateral foot at the level of metatarsal heads is reopened and probes medially by about a centimeter or 2. She notes that a week ago she had purulent drainage come out of an area on the left midfoot. Paradoxically the worst area is actually on the right foot is extensive with purulent drainage. We will use silver alginate today 02/17/2018; the patient has 3 wounds one over the left lateral foot. She still has a small area over the metatarsal heads which is the remnant of her original surgical wound. This has medial probing depth of roughly 1.4 cm somewhat better than last week. The area on the right foot is larger. We have been using silver alginate to all areas. The area on the right foot and left foot that we cultured last week showed both Klebsiella and Proteus. Both of these are quinolone sensitive. The patient put her's self on Bactrim and Flagyl that she had left hanging around from prior antibiotic usages. She was apparently on this last week when she arrived. I did not realize this. Unfortunately the Bactrim will not cover either 1 of these organisms. We will send in Cipro 500 twice daily for a week 03/04/2018; the patient has 2 wounds on the left foot one is the original wound which was a surgical wound for a deep DFU. At one point this had exposed bone. She still has an area over the fourth metatarsal head that probes about 1.4 cm although I think this is better than last week. I been using silver nitrate to try and promote tissue adherence and been using silver alginate here. She also has an area in the left midfoot. This has  some depth but a small linear wound. Still requiring debridement. On the right midfoot is a circular wound. A lot of thick callus around this area. We have been using silver alginate to all wound areas She is completed the ciprofloxacin I gave her 2 weeks ago. 03/11/2018; the patient continues to have 2 open areas on the left foot 1 of which was the original surgical wound for a deep DFU. Only a small probing area remains although this is not much different from last week we have been using silver alginate. The other area is on the midfoot this is smaller linear but still with some depth. We have been using silver alginate here as well On the right foot she has a small circular wound in the mid aspect. This is not much smaller than last time. We have been using silver alginate here as well  03/18/2018; she has 3 wounds on the left foot the original surgical wound, a very superficial wound in the mid aspect and then finally the area in the mid plantar foot. She arrives in today with a very concerning area in the wound in the mid plantar foot which is her most proximal wound. There is undermining here of roughly 1-1/2 cm superiorly. Serosanguineous drainage. She tells me she had some pain on for over the weekend that shot up her foot into her thigh and she tells me that she had a nodule in the groin area. She has the single wound in the right foot. We are using endoform to both wound areas 03/24/2018; the patient arrives with the original surgical wound in the area on the left midfoot about the same as last week. There is a collection of fluid under the surface of the skin extending from the surgical wound towards the midfoot although it does not reach the midfoot wound. The area on the right foot is about the same. Cultures from last week of the left midfoot wound showed abundant Klebsiella abundant Enterococcus faecalis and moderate methicillin resistant staph I gave her Levaquin but this would  have only covered the Klebsiella. She will need linezolid 04/01/2018; she is taking linezolid but for the first few days only took 1 a day. I have advised her to finish this at twice daily dosing. In any case all of her wounds are a lot better especially on the left foot. The original surgical wound is closed. The area on the left midfoot considerably smaller. The area on the right foot also smaller. 04/08/2018; her original surgical wound/osteomyelitis on the left foot remains closed. She has area on the left foot that is in the midfoot area but she had some streaking towards this. This is not connected with her original wound at least not visually. Small wound on the right midfoot appears somewhat smaller. 04/15/18; both wounds looks better. Original wound is better left midfoot. Using silver alginate 1/21; patient states she uses saltwater soak in, stones or remove callus from around her wounds. She is also concerned about a blood blister she had on the left foot but it simply resolved on its own. We've been using silver alginate 1/28; the patient arrives today with the same streaking area from her metatarsals laterally [the site of her original surgical wound] down to the middle of her foot. There is some drainage in the subcutaneous area here. This concerns me that there is actually continued ongoing infection in the metatarsals probably the fourth and third. This fixates an MRI of the foot without contrast [chronic renal failure] The wound in the mid part of the foot is small but I wonder whether this area actually connects with the more distal foot. The area on the right midfoot is probably about the same. Callus thick skin around the small wound which I removed with a curette we have been using silver alginate on both wound areas 2/4; culture I did of the draining site on the left foot last time grew methicillin sensitive staph aureus. MRI of the left foot showed interval resolution of the  findings surrounding the third metatarsal joint on the prior study consistent with treated osteomyelitis. Chronic soft tissue ulceration in the plantar and lateral aspect of the forefoot without residual focal fluid collection. No evidence of recurrent osteomyelitis. Noted to have the previous amputation of the distal first phalanx and fifth ray MRI of the right foot showed no evidence of  osteomyelitis I am going to treat the patient with a prolonged course of antibiotics directed against MSSA in the left foot 2/11; patient continues on cephalexin. She tells me she had nausea and vomiting over the weekend and missed 2 days. In general her foot looks much the same. She has a small open area just below the left fourth metatarsal head. A linear area in the left midfoot. Some discoloration extending from the inferior part of this into the left lateral foot although this appears to be superficial. She has a small area on the right midfoot which generally looks smaller after debridement 2/18; the patient is completing his cephalexin and has another 2 days. She continues to have open areas on the left and right foot. 2/25; she is now off antibiotics. The area on the left foot at the site of her original surgical wound has closed yet again. She still has open areas in the mid part of her foot however these appear smaller. The area on the right mid foot looks about the same. We have been using silver alginate She tells me she had a serious hypoglycemic spell at home. She had to have EMS called and get IV dextrose 3/3; disappointing on the left lateral foot large area of necrotic tissue surrounding the linear area. This appears to track up towards the same original surgical wound. Required extensive debridement. The area on the right plantar foot is not a lot better also using silver 3/12; the culture I did last time showed abundant enterococcus. I have prescribed Augmentin, should cover any unrecognized  anaerobes as well. In addition there were a few MRSA and Serratia that would not be well covered although I did not want to give her multiple antibiotics. She comes in today with a new wound in the right midfoot this is not connected with the original wound over her MTP a lot of thick callus tissue around both wounds but once again she said she is not walking on these areas 3/17-Patient comes in for follow-up on the bilateral plantar wounds, the right midfoot and the left plantar wound. Both these are heavily callused surrounding the wounds. We are continuing to use silver alginate, she is compliant with offloading and states she uses a wheelchair fairly often at home 3/24; both wound areas have thick callus. However things actually look quite a bit better here for the majority of her left foot and the right foot. 3/31; patient continues to have thick callused somewhat irritated looking tissue around the wounds which individually are fairly superficial. There is no evidence of surrounding infection. We have been using silver alginate however I change that to [facility] today 4/17; patient returns to clinic after having a scare with Covid she tested negative in her primary doctor's office. She has been using Hydrofera Blue. She does not have an open area on the right foot. On the left foot she has a small open area with the mid area not completely viable. She showed me pictures of what looks like a hemorrhagic blister from several days ago but that seems to have healed over this was on the lateral left foot 4/21; patient comes in to clinic with both her wounds on her feet closed. However over the weekend she started having pain in her right foot and leg up into the thigh. She felt as though she was running a low-grade fever but did not take her temperature. She took a doxycycline that she had leftover and yesterday a single Septra  and metronidazole. She thinks things feel somewhat  better. 4/28; duplex ultrasound I ordered last week was negative for DVT or superficial thrombophlebitis. She is completed the doxycycline I gave her. States she is still having a lot of pain in the right calf and right ankle which is no better than last week. She cannot sleep. She also states she has a temperature of up to 101, coughing and complaining of visual loss in her bilateral eyes. Apparently she was tested for Covid 2 weeks ago at [facility] and that was negative. Readmission: 09/03/18 patient presents back for reevaluation after having been evaluated at the end of April regarding erythema and swelling of her right lower extremity. Subsequently she ended up going to the hospital on 07/29/18 and was admitted not to be discharged until 08/08/18. Unfortunately it was noted during the time that she was in the hospital that she did have methicillin-resistant Staphylococcus aureus as the infection noted at the site. It was also determined that she did have osteomyelitis which appears to be fairly significant. She was treated with vancomycin and in fact is still on IV vancomycin at dialysis currently. This is actually slated to continue until 09/12/18 at least which will be the completion of the six weeks of therapy. Nonetheless based on what I'm seeing at this point I'm not sure she will be anywhere near ready to discontinue antibiotics at that time. Since she was released from the hospital she was seen by [name] who is her podiatrist on 08/27/18. His note specifically states that he is recommended that the patient needs of one knee amputation on the right as she has a life-threatening situation that can lead quickly to sepsis. The patient advised she would like to try to save her leg to which [name] apparently told her that this was against all medical advice. She also want to discontinue the Wound VAC which had been initiated due to the fact that she wasn't pleased with how the wound was looking  and subsequently she wanted to pursue applying Medihoney at that time. He stated that he did not believe that the right lower extremity was salvageable and that the patient understood but would still like to attempt hyperbaric option therapy if it could be of any benefit. She was therefore referred back to [place] for further evaluation. He plans to see her back next week. Upon inspection today patient has a significant amount purulent drainage noted from the wound at this point. The bone in the distal portion of her foot also appears to be extremely necrotic and spongy. When I push down on the bone it bubbles and seeps purulent drainage from deeper in the end of the foot. I do not think that this is likely going to heal very well at all and less aggressive surgical debridement were undertaken more than what I believe we can likely do here in our office. 09/12/2018; I have not seen this patient since the most recent hospitalization although she was in our clinic last week. I have reviewed some of her records from a complex hospitalization. 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 There is situation was complicated by MRSA bacteremia and acute on chronic renal failure now on dialysis. She is receiving vancomycin at dialysis. We started her on Dakin's wet-to- dry last week she is changing this daily. There is still purulent drainage coming out of her foot. Although she is apparently "agreeable" to a below-knee amputation which  is been suggested by multiple clinicians she wants this to be done in [place]. She apparently has a telehealth visit with that provider sometime in late [place] 6/24. I have told her I think this is probably too long. Nevertheless I could not convince her to allow a local doctor to perform BKA. 09/19/2018; the patient has a large necrotic area on the right anterior foot. She has had previous transmetatarsal amputations. Culture I did  last week showed MRSA nothing else she is on vancomycin at dialysis. She has continued leaking purulent drainage out of the distal part of the large circular wound on the right anterior foot. She apparently went to see Dr. 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 Somehow that translated into her being referred to plastic surgery for debridement of the area. I gather she basically refused amputation although I do not have a copy of Dr. Berenice Primas notes. The patient really wants to have a trial of hyperbaric oxygen. I agreed with initial assessment in this clinic that this was probably too far along to benefit however if she is going to have plastic surgery I think she would benefit from ancillary hyperbaric oxygen. The issue here is that the patient has benefited as maximally as any patient I have ever seen from hyperbaric oxygen therapy. Most recently she had exposed bone on the lateral part of her left foot after a surgical procedure and that actually has closed. She has eschared areas in both heels but no open area. She is remained systemically well. I am not optimistic that anything can be done about this but the patient is very clear that she wants an attempt. The attempt would include a wound VAC further debridements and hyperbaric oxygen along with IV antibiotics. 6/26; I put her in for a trial of hyperbaric oxygen only because of the dramatic response she has had with wounds on her left midfoot earlier this year which was a surgical wound that went straight to her bone over the metatarsal heads and also remotely the left third toe. We will see if we can get this through our review process and insurance. She arrives in clinic with again purulent material pouring out of necrotic bone on the top of the foot distally. There is also some concerning erythema on the front of the leg that we marked. It is bit difficult to tell how tender this is because of neuropathy. I  note from infectious disease that she had her vancomycin extended. 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 She had the wound VAC on for part of the week. The rest of the time she is putting various things on this including Medihoney, "ionized water" silver sorb gel etc. 7/7; follow-up along with HBO. She is still on vancomycin at dialysis. She has a large open area on the dorsal right foot and a small dark eschar area on her heel. There is a lot less erythema in the area and a lot less tenderness. From an infection point of view I think this is better. She still has a lot of necrosis in the remaining right forefoot [previous TMA] we are still using the wound VAC in this area 7/16; follow-up along with HBO. I put her on linezolid after she finished her vancomycin. We started this last Friday I gave her 2 weeks worth. I had the expectation that she would be operatively debrided by Dr. Marla Roe but that still has not happened yet. Patient phoned the  office this week. She arrives for review today after HBO. The distal part of this wound is completely necrotic. Nonviable pieces of tendon bone was still purulent drainage. Also concerning that she has black eschar over the heel that is expanding. I think this may be indicative of infection in this area as well. She has less erythema and warmth in the ankle and calf but still an abnormal exam 7/21 follow-up along with HBO. I will renew her linezolid after checking a CBC with differential monitoring her blood counts especially her platelets. She was supposed to have surgery yesterday but if I am reading things correctly this was canceled after her blood sugar was found to be over 500. I thought Dr. Marla Roe who called me said that they were sending her to the ER but the patient states that was not the case. 7/28. Follow-up along with HBO. She is on linezolid I still do not have any lab work from dialysis even though  I called last week. The patient is concerned about an area on her left lateral foot about the level of the base of her fifth metatarsal. I did not really see anything that ominous here however this patient is in [place] ability to point out problems that she is sensing and she has been accurate in the past Finally she received a call from Dr. Marla Roe who is referring her to another orthopedic surgeon stating that she is too booked up to take her to the operating room now. Was still using a wound VAC on the foot 8/3 -Follow-up after HBO, she is got another week of linezolid, she is to call [facility] for an appointment, x-rays of both feet were reviewed, the left foot x-ray with third MTP joint osteo- Right foot x-ray widespread osteo-in the right midfoot Right ankle x-ray does not show any active evidence of infection 8/11-Patient is seen after HBO, the wounds on the right foot appear to be about the same, the heel wound had some necrotic base over tendon that was debrided with a curette 8/21; patient is seen after HBO. The patient's wound on her dorsal foot actually looks reasonably good and there is substantial amount of epithelialization however the open area distally still has a lot of necrotic debris partially bone. I cannot really get a good sense of just how deep this probes under the foot. She has been pressuring me this week to order medical maggots through a company in [place] for her. The problem I have is there is not a defined wound area here. On the positive side there is no purulence. She has been to see infectious disease she is still on Septra DS although I have not had a chance to review their notes 8/28; patient is seen in conjunction with HBO. The wounds on her foot continued to improve including the right dorsal foot substantially the, the distal part of this wound and the area on the right heel. We have been using a wound VAC over this chronically. She is still on trimethoprim  as directed by infectious disease 9/4; patient is seen in conjunction with HBO. Right dorsal foot wound substantially anteriorly is better however she continues to have a deep wound in the distal part of this that is not responding. We have been using silver collagen under border foam Area on the right plantar medial heel seems better. We have been using Hydrofera Blue 12/12/18 on evaluation today patient appears to be doing about the same with regard to her wound based  on prior measurements. She does have some necrotic tissue noted on the lateral aspect of the wound that is going require a little bit of sharp debridement today. This includes what appears to be potentially either severely necrotic bone or tendon. Nonetheless other than that she does not appear to have any severe infection which is good news 9/18; it is been 2 weeks since I saw this wound. She is tolerating HBO well. Continued dramatic improvement in the area on the right dorsal foot. She still has a small wound on the heel that we have been using Hydrofera Blue. She continues with a wound VAC 9/24; patient has to be seen emergently today with a swelling on her right lateral lower leg. She says that she told Dr. Evette Doffing about this and also myself on a couple of occasions but I really have no recollection of this. She is not systemically unwell and her wound really looked good the last time I saw this. She showed this to providers at dialysis and she was able to verify that she was started on cephalexin today for 5 doses at dialysis. She dialyzes on Tuesday Thursday and Saturday. 10/2; patient is seen in conjunction with HBO. The area that is draining on the right anterior medial tibia is more extensive. Copious amounts of serosanguineous drainage with some purulence. We are still using the wound VAC on the original wound then it is stable. Culture I did of the original IandD showed MRSA I contacted dialysis she is now on vancomycin  with dialysis treatments. I asked them to run a month 10/9; patient seen in conjunction with HBO. She had a new spontaneous open area just above the wound on the right medial tibia ankle. More swelling on the right medial tibia. Her wound on the foot looks about the same perhaps slightly better. There is no warmth spreading up her leg but no obvious erythema. her MRI of the foot and ankle and distal tib-fib is not booked for next Friday I discussed this with her in great detail over multiple days. it is likely she has spreading infection upper leg at least involving the distal 25% above the ankle. She knows that if I refer her to orthopedics for infectious disease they are going to recommend amputation and indeed I am not against this myself. We had a good trial at trying to heal the foot which is what she wanted along with antibiotics debridement and HBO however she clearly has spreading infection [probably staph aureus/MRSA]. Nevertheless she once again tells me she wants to wait the left of the MRI. She still makes comments about having her amputation done in [place]. Electronic Signature(s) Signed: 01/11/2019 10:27:00 AM By: Linton Ham MD Entered By: Linton Ham on 01/11/2019 08:44:05 -------------------------------------------------------------------------------- Physical Exam Details Patient Name: Date of Service: [name] 01/09/2019 3:00 PM Medical Record Number:[id number] Patient Account Number: [id number] Date of Birth/Sex: Treating RN: 1971/03/23 (48 y.o. Nancy Fetter Primary Care Provider: [name] Other Clinician: Referring Provider: Treating Provider/Extender:Jemell Town, [name], [name] in Treatment: 64 Constitutional Patient is hypertensive.. Pulse regular and within target range for patient.Marland Kitchen Respirations regular, non-labored and within target range.. Temperature is normal and within the target range for the patient.Marland Kitchen Appears in  no distress. Eyes Conjunctivae clear. No discharge.no icterus. Cardiovascular pedal pulses are palpable. Musculoskeletal no palpable abnormalities on the left foot although she does have a subluxed cuboid bone.. Integumentary (Hair, Skin) there is no open wound on the left foot. Psychiatric appears at normal  baseline.some pressure of speech. Notes wound exam; the patient has a large area on the medial part of her remaining foot. This actually doesn't look too bad. The area on her medial heel actually looks fairly good as well. Above the wound on her foot there is a new spontaneous open area with some purulent drainage cultured. there is still swelling on the lateral part of her lower leg just above her tibia this drains a serosanguineous drainage. The IandD site I did still is draining. There is warmth up her anterior tibia which is very concerning Electronic Signature(s) Signed: 01/11/2019 10:27:00 AM By: Linton Ham MD Entered By: Linton Ham on 01/11/2019 08:48:00 -------------------------------------------------------------------------------- Physician Orders Details Patient Name: Date of Service: [name] 01/09/2019 3:00 PM Medical Record Number:[id number] Patient Account Number: [id number] Date of Birth/Sex: Treating RN: 1970/06/30 (48 y.o. Nancy Fetter Primary Care Provider: [name] Other Clinician: Referring Provider: Treating Provider/Extender:Sandro Burgo, [name], [name] in Treatment: 68 Verbal / Phone Orders: No Diagnosis Coding ICD-10 Coding Code Description M86.671 Other chronic osteomyelitis, right ankle and foot L97.514 Non-pressure chronic ulcer of other part of right foot with necrosis of bone E10.621 Type 1 diabetes mellitus with foot ulcer L02.415 Cutaneous abscess of right lower limb Follow-up Appointments Return Appointment in 1 week. Dressing Change Frequency Wound #43 Right,Medial Foot Change dressing three times  week. Wound #44 Right Calcaneus Change dressing three times week. Wound #46 Right,Lateral Lower Leg Change dressing three times week. Wound Cleansing Wound #43 Right,Medial Foot Clean wound with Wound Cleanser - anasept Wound #44 Right Calcaneus Clean wound with Wound Cleanser - anasept Wound #46 Right,Lateral Lower Leg Clean wound with Wound Cleanser - anasept Primary Wound Dressing Wound #43 Right,Medial Foot Calcium Alginate with Silver Other: - Hold wound vac this week Wound #44 Right Calcaneus Calcium Alginate with Silver Wound #46 Right,Lateral Lower Leg Calcium Alginate with Silver - cover wound. do not pack. Secondary Dressing Wound #43 Right,Medial Foot Kerlix/Rolled [name] ABD pad Wound #44 Right Calcaneus Kerlix/Rolled [name] Dry [name] Heel Cup Wound #46 Right,Lateral Lower Leg Kerlix/Rolled [name] ABD pad Edema Control Elevate legs to the level of the heart or above for 30 minutes daily and/or when sitting, a frequency of: - throughout the day [facility] for wound care. - Interim Laboratory Bacteria identified in Unspecified specimen by Anaerobe culture (MICRO) - Right medial lower leg - (ICD10 E10.621 - Type 1 diabetes mellitus with foot ulcer) LOINC Code: 517-0 Convenience Name: Anerobic culture Electronic Signature(s) Signed: 01/11/2019 10:27:00 AM By: Linton Ham MD Signed: 01/13/2019 5:52:09 PM By: [name] RN, BSN Entered By: [name] on 01/09/2019 16:08:45 -------------------------------------------------------------------------------- Problem List Details Patient Name: Date of Service: [name] 01/09/2019 3:00 PM Medical Record Number:[id number] Patient Account Number: [id number] Date of Birth/Sex: Treating RN: [birth year] (48 y.o. Nancy Fetter Primary Care Provider: [name] Other Clinician: Referring Provider: Treating Provider/Extender:Saudia Smyser, [name],  [name] in Treatment: 18 Active Problems ICD-10 Evaluated Encounter Code Description Active Date Today Diagnosis M86.671 Other chronic osteomyelitis, right ankle and foot 09/03/2018 No Yes L97.514 Non-pressure chronic ulcer of other part of right foot 09/03/2018 No Yes with necrosis of bone E10.621 Type 1 diabetes mellitus with foot ulcer 09/24/2018 No Yes L02.415 Cutaneous abscess of right lower limb 12/25/2018 No Yes Inactive Problems Resolved Problems Electronic Signature(s) Signed: 01/11/2019 10:27:00 AM By: Linton Ham MD Entered By: Linton Ham on 01/11/2019 08:40:06 -------------------------------------------------------------------------------- Progress Note Details Patient Name: Date of Service: [name]  01/09/2019 3:00 PM Medical Record Number:[id number] Patient Account Number: [id number] Date of Birth/Sex: Treating RN: [birth year] (48 y.o. Nancy Fetter Primary Care Provider: [name] Other Clinician: Referring Provider: Treating Provider/Extender:Lassie Demorest, [name], [name] in Treatment: 18 Subjective History of Present Illness (HPI) 48 year old diabetic who is known to have type 1 diabetes which is poorly controlled last hemoglobin A1c was 11%. She comes in with a ulcerated area on the left lateral foot which has been there for over 6 months. Was recently she has been treated by [name] of podiatry who saw her last on 05/28/2016. Review of his notes revealed that the patient had incision and drainage with placement of antibiotic beads to the left foot on 04/11/2016 for possible osteomyelitis of the cuboid bone. Over the last year she's had a history of amputation of the left fifth toe and a femoropopliteal popliteal bypass graft somewhere in April 2017. 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 His note [name] mentions that the patient has been referred to me for further wound care and possibly great candidate  for hyperbaric oxygen therapy due to recurrent osteomyelitis. However we do not have any x-rays of biopsy reports confirming this. He has been on several antibiotics including Bactrim and most recently is on doxycycline for an MRSA. I understand, the patient was not a candidate for IV antibiotics as she has had previous PICC lines which resulted in blood clots in both arms. There was a x-ray report dated 04/04/2016 on [name] notes which showed evidence of fifth ray resection left foot with osteolytic changes noted to the fourth metatarsal and cuboid bone on the left. 06/13/2016 -- had a left foot x-ray which showed no acute fracture or dislocation and no definite radiographic evidence of osteomyelitis. Advanced osteopenia was seen. 06/20/2016 -- she has noticed a new wound on the right plantar foot in the region where she had a callus before. 06/27/16- the patient did have her x-ray of the right foot which showed no findings to suggest osteomyelitis. She saw her endocrinologist, [name], yesterday. Her A1c in January was 11. He also indicates mismanagement and noncompliance regarding her diabetes. She is currently on Bactrim for a lip infection. She is complaining of nausea, vomiting and diarrhea. She is unable to articulate the exact orders or dosing of the Bactrim; it is unclear when she will complete this. 07/04/2016 -- results from [facility] of ABIs with ankle waveforms were noted from 02/14/2016. The examination done on 06/27/2015 showed noncompressible ABIs with the right being 1.45 and the left being 1.33. The present examination showed a right ABI of 1.19 on the left of 1.33. The conclusion was that right normal ABI in the lower extremity at rest however compared to previous study which was noncompressible ABI may be falsely elevated side suggesting medial calcification. The left ABI suggested medial calcification. 08/01/2016 -- the patient had more redness and pain on her right  foot and did not get to come to see as noted she see her PCP or go to the ER and decided to take some leftover metronidazole which she had at home. As usual, the patient does report she feels and is rather noncompliant. 08/08/2016 -- -- x-ray of the right foot -- FINDINGS:Transmetatarsal amputation is noted. No bony destruction is noted to suggest osteomyelitis. IMPRESSION: No evidence of osteomyelitis. Postsurgical changes are seen. MRI would be more sensitive for possible bony changes. Culture has grown Serratia Marcescens -- sensitive to Bactrim, ciprofloxacin, ceftazidime she was  seen by [name] on 08/06/2016. He did not find any exposed bone, muscle, tendon, ligament or joint. There was no malodor and he did a excisional debridement in the office. ============ Old notes: 48 year old patient who is known to the wound clinic for a while had been away from the wound clinic since 09/01/2014. Over the last several months she has been admitted to various hospitals including [place] at [facility]. She was treated for a right metatarsal osteomyelitis with a transmetatarsal amputation and this was done about 2 months ago. He has a small ulcerated area on the right heel and she continues to have an ulcerated area on the left plantar aspect of the foot. The patient was recently admitted to the [facility] hospital group between 7/12 and 10/18/2014. she was given 3 weeks of IV vancomycin and was to follow-up with her surgeons at [facility] and also took oral vancomycin for C. difficile colitis. 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 MRI of the left foot done on 10/14/2014 showed no abscess or  osteomyelitis. 04/27/15; this is a patient we know from previous stays in the wound care center. She is a type I diabetic I am not sure of her control currently. Since the last time I saw her she is had a right transmetatarsal amputation and has no wounds on her right foot and has no open wounds. She is been followed at the wound care center at [facility] in [place]. She comes today with the desire to undergo hyperbaric treatment locally. Apparently one of her wound care providers in [place] has suggested hyperbarics. This is in response to an MRI from 04/18/15 that showed increased marrow signal and loss of the proximal fifth metatarsal cortex evidence of osteomyelitis with likely early osteomyelitis in the cuboid bone as well. She has a large wound over the base of the fifth metatarsal. She also has a eschar over her the tips of her toes on 1,3 and 5. She does not have peripheral pulses and apparently is going for an angiogram tomorrow which seems reasonable. After this she is going to infectious disease at [facility]. They have been using Medihoney to the large wound on the lateral aspect of the left foot to. The patient has known Charcot deformity from diabetic neuropathy. She also has known diabetic PAD. Surprisingly I can't see that she has had any recent antibiotics, the patient states the last antibiotic she had was at the end of November for 10 days. I think this was in response to culture that showed group G strep although I'm not exactly sure where the culture was from. She is also had arterial studies on 03/29/15. This showed a right ABI of 1.4 that was noncompressible. Her left ABI was 0.73. 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 It was not felt that arterial inflow was adequate for healing of a foot ulcer. Her Doppler waveforms looked monophasic ===== READMISSION 02/28/17; this is in an now 48 year old woman we've had at several different occasions  in this clinic. 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 She has a remote ex-smoker. She was last seen in this clinic by Dr. Con Memos I think in May. More recently she is been followed by her podiatrist  [name] an infectious disease Dr. Megan Salon. She has 2 open wounds the major one is over the right first metatarsal head she also has a wound on the left plantar foot. an MRI of the right foot on 01/01/17 showed a soft tissue ulcer along the plantar aspect of the first metatarsal base consistent with osteomyelitis of the first metatarsal stump. Dr. 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 According to the patient this is been open for slightly over a month. She has been on a combination of Cipro 500 twice a day, Zyvox 600 twice a day and Flagyl 500 3 times a day for over a month now as directed by Dr. Megan Salon. cultures of the right foot earlier this year showed MRSA in January and Serratia in May. January also had a few viridans strep. Recent x-rays of both feet were done and [name] office and I don't have these reports. The patient has known PAD and has a history of aleft femoropopliteal bypass in April 2017. 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 Dwyane Dee. In spite of this the last hemoglobin A1c I can see is 10.1 on 01/01/2017. She is being referred by 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 She is been using Medihoney to this area and also an area on the left midfoot. She is using healing sandals bilaterally. ABIs in this clinic at the left posterior tibial was 1.1 noncompressible on the right READMISSION Non invasive vascular  NOVANT 5/18 Aftercare following surgery of the circulatory system Procedure Note - Interface, External Ris In - 08/13/2016 11:05 AM EDT Procedure: Examination consists of physiologic resting arterial pressures of the brachial and ankle arteries bilaterally with continuous wave Doppler waveform analysis. Previous: Previous exam performed on 02/14/16 demonstrated ABIs of Rt = 1.19 and Lt = 1.33. Right: ABI = non-compressible PT, 1.47 DP. S/P transmet amputation. Left: ABI = 1.52, 2nd digit pressure = 87 mmHg Conclusions: Right: ABI (>1.3) may be falsely elevated, suggesting medial calcification. Left: ABI (>1.3) may be falsely elevated, suggesting medial calcification The patient is a now 48 year old type I diabetic is had multiple issues her graded to chronic diabetic foot ulcers. 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 We had her in the clinic lastin November. At that point she had wounds on her bilateral feet.she had wanted to try hyperbarics however the healogics review process denied her because she hadn't followed up with her vascular surgeon for her left femoropopliteal bypass. The bypass was done by Dr. Raul Del at [facility]. We made her a follow-up with Dr. Raul Del however she did not keep the appointment and therefore she was not approved The patient shows me a small wound on her left fourth metatarsal head on her phone. She developed rapid discoloration in the plantar aspect of the left foot and she was admitted to hospital from 2/2 through 05/10/17 with wet gangrene of the left foot osteomyelitis of the fourth metatarsal heads. She was admitted acutely ill with a temperature of 103. She was started on broad-spectrum vancomycin and cefepime. On 05/06/17 she was taken to the OR by [name] her podiatric surgeon for an incision and drainage irrigation  of the left foot wound. Cultures from this surgery  revealed group be strep and anaerobes. she was seen by Dr.Xu of orthopedic surgery and scheduled for a below-knee amputation which she u refused. Ultimately she was discharged on Levaquin and Flagyl for one month. MRI 05/05/17 done while she was in the hospital showed abscess adjacent to the fourth metatarsal head and neck small abscess around the fourth flexor tendon. Inflammatory phlegmon and gas in the soft tissues along the lateral aspect of the fourth phalanx. Findings worrisome for osteomyelitis involving the fourth proximal and middle phalanx and also the third and fourth metatarsals. Finally the patient had actually shortly before this followed up with Dr. Raul Del at no time on 04/29/17. He felt that her left femoropopliteal bypass was patent he felt that her left-sided toe pressures more than adequate for healing a wound on the left foot. This was before her acute presentation. 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 05/28/17; she is started hyperbarics. The patient tells me that for some reason she was not actually on Levaquin but I think on ciprofloxacin. She was on Flagyl. She only started her Levaquin yesterday due to some difficulty with the pharmacy and perhaps her sister picking it up. She has an appointment with [name] tomorrow and with infectious disease early next week. She has no new complaints 06/06/17; the patient continues in hyperbarics. She saw [name] on 05/29/17 who is her podiatric surgeon. He is elected for a transmetatarsal amputation on 06/27/17. I'm not sure at what level he plans to do this amputation. The patient is unaware ooShe also saw Dr. Megan Salon of infectious disease who elected to continue her on current antibiotics I think this is ciprofloxacin and Flagyl. I'll need to clarify with her tomorrow if she actually has this. We're using silver alginate to the actual wound. Necrotic surface today with material under the flap of her foot. ooOriginal MRI showed  abscesses as well as osteomyelitis of the proximal and middle fourth phalanx and the third and fourth metatarsal heads 06/11/17; patient continues in hyperbarics and continues on oral antibiotics. She is doing well. The wound looks better. The necrotic part of this under the flap in her superior foot also looks better. she is been to see [name]. I haven't had a chance to look at his note. Apparently he has put the transmetatarsal amputation on hold her request it is still planning to take her to the OR for debridement and product application ACEL. I'll see if I can find his note. I'll therefore leave product ordering/requests to [name] for now. I was going to look at Dermagraft 06/18/17-she is here in follow-up evaluation for bilateral foot wounds. She continues with hyperbaric therapy. She states she has been applying manuka honey to the right plantar foot and alternate manuka honey and silver alginate to the left foot, despite our orders. We will continue with same treatment plan and she will follo up next week. 06/25/17; I have reviewed [name] last note from 3/11. She has operative debridement in 2 days' time. By review his note apparently they're going to place there is skin over the majority of this wound which is a good choice. She has a small satellite area at the most proximal part of this wound on the left plantar foot. The area on the right plantar foot we've been using silver alginate and it is close to healing. 07/02/17; unfortunately the patient was not easily approved for [name] proposed surgery. I'm not  completely certain what the issue is. She has been using silver alginate to the wound she has completed a first course of hyperbarics. She is still on Levaquin and Flagyl. I have really lost track of the time course here.I suspect she should have another week to 2 of antibiotics. I'll need to see if she is followed up with infectious disease Dr. Megan Salon 07/09/17; the patient is  followed up with Dr. Megan Salon. 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 She continues on Levaquin and metronidazole continuing both of these for now I think she is been on fr about 6 weeks. She still has some drainage but no pain. No fever. Her had been plans for her to go to the OR for operative debridement with her podiatrist [name], I am not exactly sure where that is. I'll probably slip a note to [name] today. I note that she follows with Dr. Dwyane Dee of endocrinology. We have her recertified for hyperbaric oxygen. I have not heard about Dermagraft however I'll see if [name] is planning a skin substitute as well 07/16/17; the patient tells me she is just about out of [place]. I'll need to check Dr. Hale Bogus last notes on this. She states she has plenty of Flagyl however. She comes in today complaining of pain in the right lateral foot which she said lasted for about a day. The wound on the right foot is actually much more medially. She also tells me that the [facility] cost a lot of pain in the left foot wound and she turned back to silver alginate. Finally Dermagraft has a $812 per application co-pay. She cannot afford this 07/23/17; patient arrives today with the wound not much smaller. There is not much new to add. She has not heard from [name] all try to put in a call to them today. She was asking about Dermagraft again and she has an over $751 per application co-pay she states that she would be willing to try to do a payment plan. I been tried to avoid this. We've been using silver alginate, I'll change to [facility] 07/30/17-She is here in follow-up evaluation for left foot ulcer. She continues hyperbaric medicine. The left foot ulcer is stable we will continue with same treatment plan 08/06/17; she is here for evaluation of her left foot ulcer. Currently being treated for hyperbarics or underlying osteomyelitis. She is  completed antibiotics. The left foot ulcer is better smaller with healthier looking granulation. For various reasons I am not really clear on we never got her back to the OR with [name]. He did not respond to my secure text message. Nevertheless I think that surgery on this point is not necessary nor am I completely clear that a skin substitute is necessary The patient is complaining about pain on the outside of her right foot. 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 There is no erythema. She also states the foot is warm versus her other part of her upper leg and this is largely true. It is not totally clear to me what's causing this. She thinks it's different from her usual neuropathy pain 08/13/17; she arrives in clinic today with a small wound which is superficial on her right first metatarsal head. 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 She tells [place] she was up on her feet over the Mother's Day celebration. ooThe large wound is on the left foot. Continues with hyperbarics  for underlying osteomyelitis. We're using Hydrofera Blue. She asked me today about where we were with Dermagraft. I had actually excluded this because of the co- pay however she wants to assume this therefore I'll recheck the co-pay an order for next week. 08/20/17; the patient agreed to accept the co-pay of the first Apligraf which we applied today. She is disappointed she is finishing hyperbarics will run this through the insurance on the extent of the foot infection and the extent of the wound that she had however she is already had 60 dive's. Dermagraft No. 1 08/27/17; Dermagraft No. 2. She is not eligible for any more hyperbaric treatments this month. She reports a fair amount of drainage and she actually changed to the external dressings without disturbing the direct contact layer 09/03/17; the patient arrived in clinic today with the wound superficially looking quite healthy. Nice  vibrant red tissue with some advancing epithelialization although not as much adherence of the flap as I might like. However she noted on her own fourth toe some bogginess and she brought that to our attention. Indeed this was boggy feeling like a possibility of subcutaneous fluid. 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 She is not been unwell. We've been using Dermagraft 09/10/17; the culture that I did not last week was MRSA. She saw Dr. Megan Salon this morning who is going to start her on vancomycin. I had sent him a secure a text message yesterday. I also spoke with her podiatric surgeon [name] about surgery on this foot the options for conserving a functional foot etc. Promised me he would see her and will make back consultation today. Paradoxically her actual wound on the plantar aspect of her left foot looks really quite good. I had given her 5 days worth of Baxdella to cover her for MRSA. Her MRI came back showing osteomyelitis within the third metatarsal shaft and head and base of the third and fourth proximal phalanx. She had extensive inflammatory changes throughout the soft tissue of the lateral forefoot. With an ill-defined fluid around the fourth metatarsal extending into the plantar and dorsal soft tissues 09/19/17; the patient is actually on oral Septra and Flagyl. She apparently refused IV vancomycin. She also saw [name] at my request who is planning her for a left BKA sometime in mid July. MRI showed osteomyelitis within the third metatarsal shaft and head and the basis of the third and fourth proximal phalanx. I believe there was felt to be possible septic arthritis involving the third MTP. 09/26/17; the patient went back to Dr. Megan Salon at my suggestion and is now receiving IV daptomycin. Her wound continues to look quite good making the decision to proceed with a transmetatarsal amputation although  more difficult for the patient. I believe in my extensive discussions with her she has a good sense of the pros and cons of this. I don't NV the tuft decision she has to make. She has an appointment with [name] I believe in mid July and I previously spoken to him about this issue Has we had used 3 previous Dermagraft. Given the condition of the wound surface I went ahead and added the fourth one today area and I did this not fully realizing that she'll be traveling to [place] next week. I'm hopeful she can come back in 2 weeks 10/21/17; Her same Dermagraft on for about 3-1/2 weeks. In spite of this  the wound arrives looking quite healthy. There is been a lot of healing dimensions are smaller. Looking at the square shaped wound she has now there is some undermining and some depth medially under the undermining although I cannot palpate any bone. No surrounding infection is obvious. She has difficult questions about how to look at this going forward vis--vis amputations versus continued medical therapy. To be truthful the wound is looks so healthy and it is continued to contract. Hard to justify foot surgery at this point although I still told her that I think it might come to that if we are not able to eradicate the underlying MRSA. She is still highly at risk and she understands this 11/06/17 on evaluation today patient appears to be doing better in regard to her foot ulcer. She's been tolerating the dressing changes without complication. Currently she is here for her Dermagraft #6. Her wound continues to make excellent progress at this point. She does not appear to have any evidence of infection which is good news. 11/13/17 on evaluation today patient appears to be doing excellent at this time. She is here for repeat Dermagraft application. This is #7. Overall her wound seems to be making great progress. 12/05/17; the patient arrives with the wound in much better condition than when I last saw  this almost 6 weeks ago. She still has a small probing area in the left metatarsal head region on the lateral aspect of her foot. We applied her last Dermagraft today. ooSince the last time she is here she has what appears to a been a blood blister on the plantar aspect of left foot although I don't see this is threatening. There is also a thick raised tissue on the right mid metatarsal head region. This was not there I don't think the last time she was here 3 weeks ago. 12/12/17; the patient continues to have a small programming area in the left metatarsal head region on the lateral aspect of her foot which was the initial large surgical wound. I applied her last Apligraf last week. I'm going to use Endoform starting today ooUnfortunately she has an excoriated area in the left mid foot and the right mid foot. The left midfoot looks like a blistered area this was not opened last week it certainly is open today. Using silver alginate on these areas. She promises me she is offloading this. 12/19/17; the small probing area in the left metatarsal head eyes think is shallower. In general her original wound looks better. We've been using Endoform. The area inferiorly that I think was trauma last week still requires debridement a lot of nonviable surface which I removed. She still has an open open area distally in her foot ooSimilarly on the right foot there is tightly adherent surface debris which I removed. Still areas that don't look completely epithelialized. This is a small open area. We used silver alginate on these areas 12/26/2017; the patient did not have the supplies we ordered from last week including the Endoform. The original large wound on the left lateral foot looks healthy. She still has the undermining area that is largely unchanged from last week. She has the same heavily callused raised edged wounds on the right mid and left midfoot. Both of these requiring debridement. We have been  using silver alginate on these areas 01/02/2018; there is still supply issues. We are going to try to use Prisma but I am not sure she actually got it from what she is saying.  She has a new open area on the lateral aspect of the left fourth toe [previous fifth ray amputation]. Still the one tunneling area over the fourth metatarsal head. The area is in the midfoot bilaterally still have thick callus around them. She is concerned about a raised swelling on the lateral aspect of the foot. However she is completely insensate 01/10/2018; we are using Prisma to the wounds on her bilateral feet. Surprisingly the tunneling area over the left fourth metatarsal head that was part of her original surgery has closed down. She has a small open area remaining on the incision line. 2 open areas in the midfoot. 02/10/2018; the patient arrives back in clinic after a month hiatus. She was traveling to visit family in [place]. Is fairly clear she was not offloading the areas on her feet. The original wound over the left lateral foot at the level of metatarsal heads is reopened and probes medially by about a centimeter or 2. She notes that a week ago she had purulent drainage come out of an area on the left midfoot. Paradoxically the worst area is actually on the right foot is extensive with purulent drainage. We will use silver alginate today 02/17/2018; the patient has 3 wounds one over the left lateral foot. She still has a small area over the metatarsal heads which is the remnant of her original surgical wound. This has medial probing depth of roughly 1.4 cm somewhat better than last week. The area on the right foot is larger. We have been using silver alginate to all areas. The area on the right foot and left foot that we cultured last week showed both Klebsiella and Proteus. Both of these are quinolone sensitive. The patient put her's self on Bactrim and Flagyl that she had left hanging around from  prior antibiotic usages. She was apparently on this last week when she arrived. I did not realize this. Unfortunately the Bactrim will not cover either 1 of these organisms. We will send in Cipro 500 twice daily for a week 03/04/2018; the patient has 2 wounds on the left foot one is the original wound which was a surgical wound for a deep DFU. At one point this had exposed bone. She still has an area over the fourth metatarsal head that probes about 1.4 cm although I think this is better than last week. I been using silver nitrate to try and promote tissue adherence and been using silver alginate here. ooShe also has an area in the left midfoot. This has some depth but a small linear wound. Still requiring debridement. ooOn the right midfoot is a circular wound. A lot of thick callus around this area. ooWe have been using silver alginate to all wound areas ooShe is completed the ciprofloxacin I gave her 2 weeks ago. 03/11/2018; the patient continues to have 2 open areas on the left foot 1 of which was the original surgical wound for a deep DFU. Only a small probing area remains although this is not much different from last week we have been using silver alginate. The other area is on the midfoot this is smaller linear but still with some depth. We have been using silver alginate here as well ooOn the right foot she has a small circular wound in the mid aspect. This is not much smaller than last time. We have been using silver alginate here as well 03/18/2018; she has 3 wounds on the left foot the original surgical wound, a very superficial wound in  the mid aspect and then finally the area in the mid plantar foot. She arrives in today with a very concerning area in the wound in the mid plantar foot which is her most proximal wound. There is undermining here of roughly 1-1/2 cm superiorly. Serosanguineous drainage. She tells me she had some pain on for over the weekend that shot up her foot  into her thigh and she tells me that she had a nodule in the groin area. ooShe has the single wound in the right foot. ooWe are using endoform to both wound areas 03/24/2018; the patient arrives with the original surgical wound in the area on the left midfoot about the same as last week. There is a collection of fluid under the surface of the skin extending from the surgical wound towards the midfoot although it does not reach the midfoot wound. The area on the right foot is about the same. Cultures from last week of the left midfoot wound showed abundant Klebsiella abundant Enterococcus faecalis and moderate methicillin resistant staph I gave her Levaquin but this would have only covered the Klebsiella. She will need linezolid 04/01/2018; she is taking linezolid but for the first few days only took 1 a day. I have advised her to finish this at twice daily dosing. In any case all of her wounds are a lot better especially on the left foot. The original surgical wound is closed. The area on the left midfoot considerably smaller. The area on the right foot also smaller. 04/08/2018; her original surgical wound/osteomyelitis on the left foot remains closed. She has area on the left foot that is in the midfoot area but she had some streaking towards this. This is not connected with her original wound at least not visually. ooSmall wound on the right midfoot appears somewhat smaller. 04/15/18; both wounds looks better. Original wound is better left midfoot. Using silver alginate 1/21; patient states she uses saltwater soak in, stones or remove callus from around her wounds. She is also concerned about a blood blister she had on the left foot but it simply resolved on its own. We've been using silver alginate 1/28; the patient arrives today with the same streaking area from her metatarsals laterally [the site of her original surgical wound] down to the middle of her foot. There is some drainage in the  subcutaneous area here. This concerns me that there is actually continued ongoing infection in the metatarsals probably the fourth and third. This fixates an MRI of the foot without contrast [chronic renal failure] ooThe wound in the mid part of the foot is small but I wonder whether this area actually connects with the more distal foot. ooThe area on the right midfoot is probably about the same. Callus thick skin around the small wound which I removed with a curette we have been using silver alginate on both wound areas 2/4; culture I did of the draining site on the left foot last time grew methicillin sensitive staph aureus. MRI of the left foot showed interval resolution of the findings surrounding the third metatarsal joint on the prior study consistent with treated osteomyelitis. Chronic soft tissue ulceration in the plantar and lateral aspect of the forefoot without residual focal fluid collection. No evidence of recurrent osteomyelitis. Noted to have the previous amputation of the distal first phalanx and fifth ray MRI of the right foot showed no evidence of osteomyelitis I am going to treat the patient with a prolonged course of antibiotics directed against MSSA in  the left foot 2/11; patient continues on cephalexin. She tells me she had nausea and vomiting over the weekend and missed 2 days. In general her foot looks much the same. She has a small open area just below the left fourth metatarsal head. A linear area in the left midfoot. Some discoloration extending from the inferior part of this into the left lateral foot although this appears to be superficial. She has a small area on the right midfoot which generally looks smaller after debridement 2/18; the patient is completing his cephalexin and has another 2 days. She continues to have open areas on the left and right foot. 2/25; she is now off antibiotics. The area on the left foot at the site of her original surgical wound has  closed yet again. She still has open areas in the mid part of her foot however these appear smaller. The area on the right mid foot looks about the same. We have been using silver alginate She tells me she had a serious hypoglycemic spell at home. She had to have EMS called and get IV dextrose 3/3; disappointing on the left lateral foot large area of necrotic tissue surrounding the linear area. This appears to track up towards the same original surgical wound. Required extensive debridement. The area on the right plantar foot is not a lot better also using silver 3/12; the culture I did last time showed abundant enterococcus. I have prescribed Augmentin, should cover any unrecognized anaerobes as well. In addition there were a few MRSA and Serratia that would not be well covered although I did not want to give her multiple antibiotics. She comes in today with a new wound in the right midfoot this is not connected with the original wound over her MTP a lot of thick callus tissue around both wounds but once again she said she is not walking on these areas 3/17-Patient comes in for follow-up on the bilateral plantar wounds, the right midfoot and the left plantar wound. Both these are heavily callused surrounding the wounds. We are continuing to use silver alginate, she is compliant with offloading and states she uses a wheelchair fairly often at home 3/24; both wound areas have thick callus. However things actually look quite a bit better here for the majority of her left foot and the right foot. 3/31; patient continues to have thick callused somewhat irritated looking tissue around the wounds which individually are fairly superficial. There is no evidence of surrounding infection. We have been using silver alginate however I change that to [facility] today 4/17; patient returns to clinic after having a scare with Covid she tested negative in her primary doctor's office. She has been using  Hydrofera Blue. She does not have an open area on the right foot. On the left foot she has a small open area with the mid area not completely viable. She showed me pictures of what looks like a hemorrhagic blister from several days ago but that seems to have healed over this was on the lateral left foot 4/21; patient comes in to clinic with both her wounds on her feet closed. However over the weekend she started having pain in her right foot and leg up into the thigh. She felt as though she was running a low-grade fever but did not take her temperature. She took a doxycycline that she had leftover and yesterday a single Septra and metronidazole. She thinks things feel somewhat better. 4/28; duplex ultrasound I ordered last week was negative  for DVT or superficial thrombophlebitis. She is completed the doxycycline I gave her. States she is still having a lot of pain in the right calf and right ankle which is no better than last week. She cannot sleep. She also states she has a temperature of up to 101, coughing and complaining of visual loss in her bilateral eyes. Apparently she was tested for Covid 2 weeks ago at [facility] and that was negative. Readmission: 09/03/18 patient presents back for reevaluation after having been evaluated at the end of April regarding erythema and swelling of her right lower extremity. Subsequently she ended up going to the hospital on 07/29/18 and was admitted not to be discharged until 08/08/18. Unfortunately it was noted during the time that she was in the hospital that she did have methicillin-resistant Staphylococcus aureus as the infection noted at the site. It was also determined that she did have osteomyelitis which appears to be fairly significant. She was treated with vancomycin and in fact is still on IV vancomycin at dialysis currently. This is actually slated to continue until 09/12/18 at least which will be the completion of the six weeks of therapy. Nonetheless  based on what I'm seeing at this point I'm not sure she will be anywhere near ready to discontinue antibiotics at that time. Since she was released from the hospital she was seen by [name] who is her podiatrist on 08/27/18. His note specifically states that he is recommended that the patient needs of one knee amputation on the right as she has a life-threatening situation that can lead quickly to sepsis. The patient advised she would like to try to save her leg to which [name] apparently told her that this was against all medical advice. She also want to discontinue the Wound VAC which had been initiated due to the fact that she wasn't pleased with how the wound was looking and subsequently she wanted to pursue applying Medihoney at that time. He stated that he did not believe that the right lower extremity was salvageable and that the patient understood but would still like to attempt hyperbaric option therapy if it could be of any benefit. She was therefore referred back to [place] for further evaluation. He plans to see her back next week. Upon inspection today patient has a significant amount purulent drainage noted from the wound at this point. The bone in the distal portion of her foot also appears to be extremely necrotic and spongy. When I push down on the bone it bubbles and seeps purulent drainage from deeper in the end of the foot. I do not think that this is likely going to heal very well at all and less aggressive surgical debridement were undertaken more than what I believe we can likely do here in our office. 09/12/2018; I have not seen this patient since the most recent hospitalization although she was in our clinic last week. I have reviewed some of her records from a complex hospitalization. 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 There is situation was complicated by MRSA bacteremia and acute on chronic renal failure now on  dialysis. She is receiving vancomycin at dialysis. We started her on Dakin's wet-to- dry last week she is changing this daily. There is still purulent drainage coming out of her foot. Although she is apparently "agreeable" to a below-knee amputation which is been suggested by multiple clinicians she wants this to be done in [place]. She apparently has  a telehealth visit with that provider sometime in late [place] 6/24. I have told her I think this is probably too long. Nevertheless I could not convince her to allow a local doctor to perform BKA. 09/19/2018; the patient has a large necrotic area on the right anterior foot. She has had previous transmetatarsal amputations. Culture I did last week showed MRSA nothing else she is on vancomycin at dialysis. She has continued leaking purulent drainage out of the distal part of the large circular wound on the right anterior foot. She apparently went to see Dr. 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 Somehow that translated into her being referred to plastic surgery for debridement of the area. I gather she basically refused amputation although I do not have a copy of Dr. Berenice Primas notes. The patient really wants to have a trial of hyperbaric oxygen. I agreed with initial assessment in this clinic that this was probably too far along to benefit however if she is going to have plastic surgery I think she would benefit from ancillary hyperbaric oxygen. The issue here is that the patient has benefited as maximally as any patient I have ever seen from hyperbaric oxygen therapy. Most recently she had exposed bone on the lateral part of her left foot after a surgical procedure and that actually has closed. She has eschared areas in both heels but no open area. She is remained systemically well. I am not optimistic that anything can be done about this but the patient is very clear that she wants an attempt. The attempt would  include a wound VAC further debridements and hyperbaric oxygen along with IV antibiotics. 6/26; I put her in for a trial of hyperbaric oxygen only because of the dramatic response she has had with wounds on her left midfoot earlier this year which was a surgical wound that went straight to her bone over the metatarsal heads and also remotely the left third toe. We will see if we can get this through our review process and insurance. She arrives in clinic with again purulent material pouring out of necrotic bone on the top of the foot distally. There is also some concerning erythema on the front of the leg that we marked. It is bit difficult to tell how tender this is because of neuropathy. I note from infectious disease that she had her vancomycin extended. 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 She had the wound VAC on for part of the week. The rest of the time she is putting various things on this including Medihoney, "ionized water" silver sorb gel etc. 7/7; follow-up along with HBO. She is still on vancomycin at dialysis. She has a large open area on the dorsal right foot and a small dark eschar area on her heel. There is a lot less erythema in the area and a lot less tenderness. From an infection point of view I think this is better. She still has a lot of necrosis in the remaining right forefoot [previous TMA] we are still using the wound VAC in this area 7/16; follow-up along with HBO. I put her on linezolid after she finished her vancomycin. We started this last Friday I gave her 2 weeks worth. I had the expectation that she would be operatively debrided by Dr. Marla Roe but that still has not happened yet. Patient phoned the office this week. She arrives for review today after HBO. The distal part of this wound is completely  necrotic. Nonviable pieces of tendon bone was still purulent drainage. Also concerning that she has black eschar over the heel that  is expanding. I think this may be indicative of infection in this area as well. She has less erythema and warmth in the ankle and calf but still an abnormal exam 7/21 follow-up along with HBO. I will renew her linezolid after checking a CBC with differential monitoring her blood counts especially her platelets. She was supposed to have surgery yesterday but if I am reading things correctly this was canceled after her blood sugar was found to be over 500. I thought Dr. Marla Roe who called me said that they were sending her to the ER but the patient states that was not the case. 7/28. Follow-up along with HBO. She is on linezolid I still do not have any lab work from dialysis even though I called last week. The patient is concerned about an area on her left lateral foot about the level of the base of her fifth metatarsal. I did not really see anything that ominous here however this patient is in [place] ability to point out problems that she is sensing and she has been accurate in the past Finally she received a call from Dr. Marla Roe who is referring her to another orthopedic surgeon stating that she is too booked up to take her to the operating room now. Was still using a wound VAC on the foot 8/3 -Follow-up after HBO, she is got another week of linezolid, she is to call ID for an appointment, x-rays of both feet were reviewed, the left foot x-ray with third MTP joint osteo- Right foot x-ray widespread osteo-in the right midfoot Right ankle x-ray does not show any active evidence of infection 8/11-Patient is seen after HBO, the wounds on the right foot appear to be about the same, the heel wound had some necrotic base over tendon that was debrided with a curette 8/21; patient is seen after HBO. The patient's wound on her dorsal foot actually looks reasonably good and there is substantial amount of epithelialization however the open area distally still has a lot of necrotic debris partially  bone. I cannot really get a good sense of just how deep this probes under the foot. She has been pressuring me this week to order medical maggots through a company in [place] for her. The problem I have is there is not a defined wound area here. On the positive side there is no purulence. She has been to see infectious disease she is still on Septra DS although I have not had a chance to review their notes 8/28; patient is seen in conjunction with HBO. The wounds on her foot continued to improve including the right dorsal foot substantially the, the distal part of this wound and the area on the right heel. We have been using a wound VAC over this chronically. She is still on trimethoprim as directed by infectious disease 9/4; patient is seen in conjunction with HBO. Right dorsal foot wound substantially anteriorly is better however she continues to have a deep wound in the distal part of this that is not responding. We have been using silver collagen under border foam ooArea on the right plantar medial heel seems better. We have been using Hydrofera Blue 12/12/18 on evaluation today patient appears to be doing about the same with regard to her wound based on prior measurements. She does have some necrotic tissue noted on the lateral aspect of the wound that  is going require a little bit of sharp debridement today. This includes what appears to be potentially either severely necrotic bone or tendon. Nonetheless other than that she does not appear to have any severe infection which is good news 9/18; it is been 2 weeks since I saw this wound. She is tolerating HBO well. Continued dramatic improvement in the area on the right dorsal foot. She still has a small wound on the heel that we have been using Hydrofera Blue. She continues with a wound VAC 9/24; patient has to be seen emergently today with a swelling on her right lateral lower leg. She says that she told Dr. Evette Doffing about this and also  myself on a couple of occasions but I really have no recollection of this. She is not systemically unwell and her wound really looked good the last time I saw this. She showed this to providers at dialysis and she was able to verify that she was started on cephalexin today for 5 doses at dialysis. She dialyzes on Tuesday Thursday and Saturday. 10/2; patient is seen in conjunction with HBO. The area that is draining on the right anterior medial tibia is more extensive. Copious amounts of serosanguineous drainage with some purulence. We are still using the wound VAC on the original wound then it is stable. Culture I did of the original IandD showed MRSA I contacted dialysis she is now on vancomycin with dialysis treatments. I asked them to run a month 10/9; patient seen in conjunction with HBO. She had a new spontaneous open area just above the wound on the right medial tibia ankle. More swelling on the right medial tibia. Her wound on the foot looks about the same perhaps slightly better. There is no warmth spreading up her leg but no obvious erythema. her MRI of the foot and ankle and distal tib-fib is not booked for next Friday I discussed this with her in great detail over multiple days. it is likely she has spreading infection upper leg at least involving the distal 25% above the ankle. She knows that if I refer her to orthopedics for infectious disease they are going to recommend amputation and indeed I am not against this myself. We had a good trial at trying to heal the foot which is what she wanted along with antibiotics debridement and HBO however she clearly has spreading infection [probably staph aureus/MRSA]. Nevertheless she once again tells me she wants to wait the left of the MRI. She still makes comments about having her amputation done in [place]. Objective Constitutional Patient is hypertensive.. Pulse regular and within target range for patient.Marland Kitchen Respirations regular,  non-labored and within target range.. Temperature is normal and within the target range for the patient.Marland Kitchen Appears in no distress. Vitals Time Taken: 3:15 PM, Height: 67 in, Weight: 125 lbs, BMI: 19.6, Temperature: 98 F, Pulse: 57 bpm, Respiratory Rate: 14 breaths/min, Blood Pressure: 144/73 mmHg, Capillary Blood Glucose: 314 mg/dl. Eyes Conjunctivae clear. No discharge.no icterus. Cardiovascular pedal pulses are palpable. Musculoskeletal no palpable abnormalities on the left foot although she does have a subluxed cuboid bone.Marland Kitchen Psychiatric appears at normal baseline.some pressure of speech. General Notes: wound exam; the patient has a large area on the medial part of her remaining foot. This actually doesn't look too bad. The area on her medial heel actually looks fairly good as well. Above the wound on her foot there is a new spontaneous open area with some purulent drainage cultured. there is still swelling on the lateral  part of her lower leg just above her tibia this drains a serosanguineous drainage. The IandD site I did still is draining. There is warmth up her anterior tibia which is very concerning Integumentary (Hair, Skin) there is no open wound on the left foot. Wound #43 status is Open. Original cause of wound was Gradually Appeared. The wound is located on the Right,Medial Foot. The wound measures 6.5cm length x 3.5cm width x 2.6cm depth; 17.868cm^2 area and 46.456cm^3 volume. There is muscle, tendon, and Fat Layer (Subcutaneous Tissue) Exposed exposed. There is no undermining noted, however, there is tunneling at 11:00 with a maximum distance of 3.8cm. There is a medium amount of serosanguineous drainage noted. The wound margin is distinct with the outline attached to the wound base. There is large (67-100%) red, pink granulation within the wound bed. There is a small (1-33%) amount of necrotic tissue within the wound bed including Adherent Slough and Necrosis of  Muscle. Wound #44 status is Open. Original cause of wound was Gradually Appeared. The wound is located on the Right Calcaneus. The wound measures 0.3cm length x 0.3cm width x 0.1cm depth; 0.071cm^2 area and 0.007cm^3 volume. There is Fat Layer (Subcutaneous Tissue) Exposed exposed. There is no tunneling or undermining noted. There is a small amount of serosanguineous drainage noted. The wound margin is flat and intact. There is large (67- 100%) red granulation within the wound bed. There is no necrotic tissue within the wound bed. Wound #46 status is Open. Original cause of wound was Other Lesion. The wound is located on the Right,Lateral Lower Leg. The wound measures 0.5cm length x 1.2cm width x 0.2cm depth; 0.471cm^2 area and 0.094cm^3 volume. There is Fat Layer (Subcutaneous Tissue) Exposed exposed. There is no tunneling or undermining noted. There is a medium amount of serosanguineous drainage noted. The wound margin is distinct with the outline attached to the wound base. There is large (67-100%) red, pink granulation within the wound bed. There is a small (1-33%) amount of necrotic tissue within the wound bed. 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 Dressing Change Frequency: Wound #43 Right,Medial Foot: Change dressing three times week. Wound #44 Right Calcaneus: Change dressing three times week. Wound #46 Right,Lateral Lower Leg: Change dressing three times week. Wound Cleansing: Wound #43 Right,Medial Foot: Clean wound with Wound Cleanser - anasept Wound #44 Right Calcaneus: Clean wound with Wound Cleanser - anasept Wound #46 Right,Lateral Lower Leg: Clean wound with Wound Cleanser - anasept Primary Wound  Dressing: Wound #43 Right,Medial Foot: Calcium Alginate with Silver Other: - Hold wound vac this week Wound #44 Right Calcaneus: Calcium Alginate with Silver Wound #46 Right,Lateral Lower Leg: Calcium Alginate with Silver - cover wound. do not pack. Secondary Dressing: Wound #43 Right,Medial Foot: Kerlix/Rolled [name] ABD pad Wound #44 Right Calcaneus: Kerlix/Rolled [name] Dry [name] Heel Cup Wound #46 Right,Lateral Lower Leg: Kerlix/Rolled [name] ABD pad Edema Control: Elevate legs to the level of the heart or above for 30 minutes daily and/or when sitting, a frequency of: - throughout the day Home Health: [facility] for wound care. - Interim Laboratory ordered were: Anerobic culture - Right medial lower leg #1 revealed on the right medial foot actually stable to slightly improved although the different at the bottom has more depth  which is worse than 2 weeks ago #2 she has a new open area which apparently happened spontaneously just above this area and laterally. This may come from the constant involvement the patient has in her own wound. This has some purulent drainage coming out of the tip of this which I cultured #3 there is the IandD site in the lateral part of her lower leg that I did 2 weeks ago to drain the abscess. Culture of this grew MRSA she is on vancomycin at dialysis #4 the area on the left heel actually looks close to closing #5 the most concerning thing for me is the warmth spreading up the front of her leg I think there is spreading uncontrolled infection here. #6 in spite of our discussion she still wants to wait to see the MRI before she makes arrangements or will consider finally a BKA on the right. I would be surprised if this does not show spreading soft tissue and/or bone infection #7 she is not systemically unwell but I've advised her if this happens she will need to be more urgent in her decision making about a below-knee  amputation. There is certainly not an option for more conservative surgery #8 she describes a different feeling in her left foot she feels the bones are displacing this could be her Charcot deformity. We're doing plain x-rays on this side. She does not have an open wound Electronic Signature(s) Signed: 01/11/2019 10:27:00 AM By: Linton Ham MD Entered By: Linton Ham on 01/11/2019 08:51:28 -------------------------------------------------------------------------------- SuperBill Details Patient Name: Date of Service: [name] 01/09/2019 Medical Record Number:[id number] Patient Account Number: [id number] Date of Birth/Sex: Treating RN: Feb 21, 1971 (48 y.o. Nancy Fetter Primary Care Provider: [name] Other Clinician: Referring Provider: Treating Provider/Extender:Inioluwa Boulay, [name], [name] in Treatment: 18 Diagnosis Coding ICD-10 Codes Code Description [phone] Other chronic osteomyelitis, right ankle and foot L97.514 Non-pressure chronic ulcer of other part of right foot with necrosis of bone E10.621 Type 1 diabetes mellitus with foot ulcer L02.415 Cutaneous abscess of right lower limb Facility Procedures CPT4 Code: [id number] Description: 91660 - WOUND CARE VISIT-LEV 4 EST PT Modifier: Quantity: 1 Electronic Signature(s) Signed: 01/11/2019 10:27:00 AM By: Linton Ham MD Entered By: Linton Ham on 01/11/2019 08:51:57

## 2019-01-14 ENCOUNTER — Other Ambulatory Visit: Payer: Self-pay

## 2019-01-14 ENCOUNTER — Encounter (HOSPITAL_BASED_OUTPATIENT_CLINIC_OR_DEPARTMENT_OTHER): Payer: Medicare HMO | Admitting: Physician Assistant

## 2019-01-14 DIAGNOSIS — E10621 Type 1 diabetes mellitus with foot ulcer: Secondary | ICD-10-CM | POA: Diagnosis not present

## 2019-01-14 LAB — GLUCOSE, CAPILLARY
Glucose-Capillary: 240 mg/dL — ABNORMAL HIGH (ref 70–99)
Glucose-Capillary: 282 mg/dL — ABNORMAL HIGH (ref 70–99)

## 2019-01-14 NOTE — Progress Notes (Addendum)
THURMA, PRIEGO ([id number]) Visit Report for 01/14/2019 HBO Details Patient Name: Date of Service: [name], [name] 01/14/2019 1:00 PM Medical Record Number:[id number] Patient Account Number: [id number] Date of Birth/Sex: Treating RN: 1970-11-29 (48 y.o. [name], Meta.Reding Primary Care Kamali Sakata: [name] Other Clinician: Referring Havanna Groner: Treating Theron Cumbie/Extender:Stone III, Lennox Grumbles, [name] in Treatment: 19 HBO Treatment Course Details Treatment Course Number: 4 Ordering Nashae Maudlin: Linton Ham Total Treatments Ordered: 50 HBO Treatment Start Date: 09/30/2018 HBO Indication: Diabetic Ulcer(s) of the Lower Extremity HBO Treatment Details Treatment Number: 67 Patient Type: Outpatient Chamber Type: Monoplace Chamber Serial #: [id number] Treatment Protocol: 2.5 ATA with 90 minutes oxygen, with two 5 minute air breaks Treatment Details Compression Rate Down: 2.0 psi / minute De-Compression Rate Up: 2.0 psi / minute Air breaks and CompressTx Pressure breathing periods DecompressDecompress Begins Reached (leave unused spaces Begins Ends blank) Chamber Pressure (ATA)1 2.5 2.5 2.5 2.5 2.5 --2.5 1 Clock Time (24 hr) 13:05 13:17 40:9811:9147:8295:62--13:08 15:09 Treatment Length: 124 (minutes) Treatment Segments: 4 Vital Signs Capillary Blood Glucose Reference Range: 80 - 120 mg / dl HBO Diabetic Blood Glucose Intervention Range: <131 mg/dl or >249 mg/dl Time Vitals Blood Respiratory Capillary Blood Glucose Pulse Action Type: Pulse: Temperature: Taken: Pressure: Rate: Glucose (mg/dl): Meter #: Oximetry (%) Taken: Pre 12:55 135/68 96 17 98.5 240 Post 15:15 172/89 84 15 98.1 282 Treatment Response Treatment Toleration: Well Treatment Completion Treatment Completed without Adverse Event Status: Additional Procedure Documentation Tissue Sevierity: Necrosis of bone Physician HBO Attestation: I certify that I supervised this HBO treatment in accordance  with Medicare guidelines. A trained Yes Yes emergency response team is readily available per hospital policies and procedures. Continue HBOT as ordered. Yes Electronic Signature(s) Signed: 01/14/2019 6:05:26 PM By: Worthy Keeler PA-C Entered By: Worthy Keeler on 01/14/2019 18:04:27 -------------------------------------------------------------------------------- HBO Safety Checklist Details Patient Name: Date of Service: [name], [name] 01/14/2019 1:00 PM Medical Record Number:[id number] Patient Account Number: [id number] Date of Birth/Sex: Treating RN: Nov 03, 1970 (48 y.o. [name], Meta.Reding Primary Care Antinio Sanderfer: [name] Other Clinician: Referring Alleyne Lac: Treating Tamaria Dunleavy/Extender:Stone III, Lennox Grumbles, [name] in Treatment: 19 HBO Safety Checklist Items Safety Checklist Consent Form Signed Patient voided / foley secured and emptied When did you last eato 1200 - steak/rice Last dose of injectable or oral agent insulin pump NA Ostomy pouch emptied and vented if applicable NA All implantable devices assessed, documented and approved NA Intravenous access site secured and place Valuables secured Linens and cotton and cotton/polyester blend (less than 51% polyester) Personal oil-based products / skin lotions / body lotions removed NA Wigs or hairpieces removed NA Smoking or tobacco materials removed Books / newspapers / magazines / loose paper removed Cologne, aftershave, perfume and deodorant removed Jewelry removed (may wrap wedding band) Make-up removed Hair care products removed Battery operated devices (external) removed NA Heating patches and chemical warmers removed NA Titanium eyewear removed NA Nail polish cured greater than 10 hours NA Casting material cured greater than 10 hours NA Hearing aids removed NA Loose dentures or partials removed NA Prosthetics have been removed Patient demonstrates correct use of air break device (if  applicable) Patient concerns have been addressed Patient grounding bracelet on and cord attached to chamber Specifics for Inpatients (complete in addition to above) Medication sheet sent with patient Intravenous medications needed or due during therapy sent with patient Drainage tubes (e.g. nasogastric tube or chest tube secured and vented) Endotracheal or Tracheotomy tube secured Cuff deflated of air and inflated with saline Airway  suctioned Electronic Signature(s) Signed: 01/14/2019 1:37:04 PM By: [name] EMT/HBOT Entered By: [name] on 01/14/2019 13:37:02

## 2019-01-14 NOTE — Progress Notes (Signed)
[name], [name] ([id number]) Visit Report for 01/14/2019 Problem List Details Patient Name: Date of Service: [name], [name] 01/14/2019 1:00 PM Medical Record Number:[id number] Patient Account Number: [id number] Date of Birth/Sex: Treating RN: 08-Oct-1970 (48 y.o. [name], Meta.Reding Primary Care Provider: [name] Other Clinician: Referring Provider: Treating Provider/Extender:Stone III, Lennox Grumbles, [name] in Treatment: 19 Active Problems ICD-10 Evaluated Encounter Code Description Active Date Today Diagnosis M86.671 Other chronic osteomyelitis, right ankle and foot 09/03/2018 No Yes L97.514 Non-pressure chronic ulcer of other part of right foot 09/03/2018 No Yes with necrosis of bone E10.621 Type 1 diabetes mellitus with foot ulcer 09/24/2018 No Yes L02.415 Cutaneous abscess of right lower limb 12/25/2018 No Yes Inactive Problems Resolved Problems Electronic Signature(s) Signed: 01/14/2019 6:05:26 PM By: Worthy Keeler PA-C Entered By: Worthy Keeler on 01/14/2019 18:04:34 -------------------------------------------------------------------------------- SuperBill Details Patient Name: Date of Service: [name] 01/14/2019 Medical Record Number:[id number] Patient Account Number: [id number] Date of Birth/Sex: Treating RN: 1971-02-28 (48 y.o. [name], Meta.Reding Primary Care Provider: [name] Other Clinician: Referring Provider: Treating Provider/Extender:Stone III, Lennox Grumbles, [name] in Treatment: 19 Diagnosis Coding ICD-10 Codes Code Description [phone] Other chronic osteomyelitis, right ankle and foot L97.514 Non-pressure chronic ulcer of other part of right foot with necrosis of bone E10.621 Type 1 diabetes mellitus with foot ulcer L02.415 Cutaneous abscess of right lower limb Facility Procedures CPT4 Code Description: [id number] G0277-(Facility Use Only) HBOT, full body chamber, 52min Modifier: Quantity: 4 Physician  Procedures CPT4 Code Description: [id number] 76160 - WC PHYS HYPERBARIC OXYGEN THERAPY ICD-10 Diagnosis Description E10.621 Type 1 diabetes mellitus with foot ulcer Modifier: Quantity: 1 Electronic Signature(s) Signed: 01/14/2019 6:05:26 PM By: Worthy Keeler PA-C Entered By: Worthy Keeler on 01/14/2019 18:04:30

## 2019-01-15 ENCOUNTER — Encounter (HOSPITAL_BASED_OUTPATIENT_CLINIC_OR_DEPARTMENT_OTHER): Payer: Medicare HMO | Admitting: Internal Medicine

## 2019-01-15 ENCOUNTER — Other Ambulatory Visit: Payer: Self-pay

## 2019-01-15 DIAGNOSIS — E10621 Type 1 diabetes mellitus with foot ulcer: Secondary | ICD-10-CM | POA: Diagnosis not present

## 2019-01-15 LAB — GLUCOSE, CAPILLARY
Glucose-Capillary: 155 mg/dL — ABNORMAL HIGH (ref 70–99)
Glucose-Capillary: 216 mg/dL — ABNORMAL HIGH (ref 70–99)

## 2019-01-15 NOTE — Progress Notes (Addendum)
[name], [name] ([id number]) Visit Report for 01/15/2019 HBO Details Patient Name: Date of Service: [name], [name] 01/15/2019 1:00 PM Medical Record Number:[id number] Patient Account Number: [id number] Date of Birth/Sex: Treating RN: [birth year] (48 y.o. Nancy Fetter Primary Care Atzin Buchta: [name] Other Clinician: Referring Madge Therrien: Treating Karnisha Lefebre/Extender:[name], [name], [name] in Treatment: 66 HBO Treatment Course Details Treatment Course Number: 4 Ordering Kelissa Merlin: Linton Ham Total Treatments Ordered: 50 HBO Treatment Start Date: 09/30/2018 HBO Indication: Diabetic Ulcer(s) of the Lower Extremity HBO Treatment Details Treatment Number: 68 Patient Type: Outpatient Chamber Type: Monoplace Chamber Serial #: [id number] Treatment Protocol: 2.5 ATA with 90 minutes oxygen, with two 5 minute air breaks Treatment Details Compression Rate Down: 2.0 psi / minute De-Compression Rate Up: 2.0 psi / minute Air breaks and CompressTx Pressure breathing periods DecompressDecompress Begins Reached (leave unused spaces Begins Ends blank) Chamber Pressure (ATA)1 2.5 2.5 2.5 2.5 2.5 --2.5 1 Clock Time (24 hr) 13:17 13:29 08:1448:1856:3149:70--26:37 15:21 Treatment Length: 124 (minutes) Treatment Segments: 4 Vital Signs Capillary Blood Glucose Reference Range: 80 - 120 mg / dl HBO Diabetic Blood Glucose Intervention Range: <131 mg/dl or >249 mg/dl Time Vitals Blood Respiratory Capillary Blood Glucose Pulse Action Type: Pulse: Temperature: Taken: Pressure: Rate: Glucose (mg/dl): Meter #: Oximetry (%) Taken: Pre 13:05 144/82 91 15 98.5 155 Post 15:22 182/92 88 16 98 216 Treatment Response Treatment Toleration: Well Treatment Completion Treatment Completed without Adverse Event Status: Additional Procedure Documentation Tissue Sevierity: Necrosis of bone Shaconda Hajduk Notes No concerns with treatment given Physician HBO Attestation: I certify that  I supervised this HBO treatment in accordance with Medicare guidelines. A trained Yes emergency response team is readily available per hospital policies and procedures. Continue HBOT as ordered. Yes Electronic Signature(s) Signed: 01/15/2019 5:35:18 PM By: Linton Ham MD Entered By: Linton Ham on 01/15/2019 16:54:19 -------------------------------------------------------------------------------- HBO Safety Checklist Details Patient Name: Date of Service: [name] 01/15/2019 1:00 PM Medical Record Number:[id number] Patient Account Number: [id number] Date of Birth/Sex: Treating RN: 08/03/1970 (48 y.o. Nancy Fetter Primary Care Ares Cardozo: [name] Other Clinician: Referring Saher Davee: Treating Moet Mikulski/Extender:[name], [name], [name] in Treatment: 19 HBO Safety Checklist Items Safety Checklist Consent Form Signed Patient voided / foley secured and emptied When did you last eato 1200 - [place] sammy/yogurt Last dose of injectable or oral agent insulin pump NA Ostomy pouch emptied and vented if applicable All implantable devices assessed, documented and approved NA Intravenous access site secured and place Valuables secured Linens and cotton and cotton/polyester blend (less than 51% polyester) Personal oil-based products / skin lotions / body lotions removed NA Wigs or hairpieces removed NA Smoking or tobacco materials removed Books / newspapers / magazines / loose paper removed Cologne, aftershave, perfume and deodorant removed Jewelry removed (may wrap wedding band) Make-up removed Hair care products removed Battery operated devices (external) removed NA Heating patches and chemical warmers removed NA Titanium eyewear removed NA Nail polish cured greater than 10 hours NA Casting material cured greater than 10 hours NA Hearing aids removed NA Loose dentures or partials removed NA Prosthetics have been removed Patient demonstrates  correct use of air break device (if applicable) Patient concerns have been addressed Patient grounding bracelet on and cord attached to chamber Specifics for Inpatients (complete in addition to above) Medication sheet sent with patient Intravenous medications needed or due during therapy sent with patient Drainage tubes (e.g. nasogastric tube or chest tube secured and vented) Endotracheal or Tracheotomy tube secured Cuff deflated of air and inflated with  saline Airway suctioned Electronic Signature(s) Signed: 01/15/2019 1:51:51 PM By: [name] EMT/HBOT Entered By: [name] on 01/15/2019 13:51:50

## 2019-01-16 ENCOUNTER — Ambulatory Visit (HOSPITAL_COMMUNITY): Payer: Medicare HMO

## 2019-01-16 ENCOUNTER — Encounter (HOSPITAL_COMMUNITY): Payer: Self-pay

## 2019-01-16 ENCOUNTER — Encounter (HOSPITAL_BASED_OUTPATIENT_CLINIC_OR_DEPARTMENT_OTHER): Payer: Medicare HMO | Admitting: Internal Medicine

## 2019-01-16 DIAGNOSIS — E10621 Type 1 diabetes mellitus with foot ulcer: Secondary | ICD-10-CM | POA: Diagnosis not present

## 2019-01-19 ENCOUNTER — Encounter (HOSPITAL_BASED_OUTPATIENT_CLINIC_OR_DEPARTMENT_OTHER): Payer: Medicare HMO | Admitting: Internal Medicine

## 2019-01-19 ENCOUNTER — Other Ambulatory Visit: Payer: Self-pay

## 2019-01-19 ENCOUNTER — Other Ambulatory Visit (HOSPITAL_COMMUNITY)
Admission: RE | Admit: 2019-01-19 | Discharge: 2019-01-19 | Disposition: A | Discharge status: Home / Self Care | Payer: Medicare HMO | Source: Other Acute Inpatient Hospital | Attending: Internal Medicine | Admitting: Internal Medicine

## 2019-01-19 DIAGNOSIS — E10621 Type 1 diabetes mellitus with foot ulcer: Secondary | ICD-10-CM | POA: Diagnosis not present

## 2019-01-19 DIAGNOSIS — L02415 Cutaneous abscess of right lower limb: Secondary | ICD-10-CM | POA: Insufficient documentation

## 2019-01-19 NOTE — Progress Notes (Signed)
[name] ([id number]) Visit Report for 01/16/2019 Arrival Information Details Patient Name: Date of Service: [name] 01/16/2019 1:00 PM Medical Record Number:[id number] Patient Account Number: [id number] Date of Birth/Sex: Treating RN: 08/27/1970 (48 y.o. [name] Primary Care [name]: [name] Other Clinician: Referring Percy Comp: Treating [name]/Extender:[name], [name] Weeks in Treatment: 31 Visit Information History Since Last Visit All ordered tests and consults were completed: Yes Patient Arrived: Wheel Chair Added or deleted any medications: No Arrival Time: 13:20 Any new allergies or adverse reactions: No Accompanied By: self Had a fall or experienced change in No activities of daily living that may affect Transfer Assistance: None risk of falls: Patient Identification Verified: Yes Signs or symptoms of abuse/neglect since last No Secondary Verification Process Yes visito Completed: Hospitalized since last visit: No Patient Requires Transmission-Based No Implantable device outside of the clinic excluding No Precautions: cellular tissue based products placed in the center Patient Has Alerts: No since last visit: Has Dressing in Place as Prescribed: Yes Pain Present Now: No Electronic Signature(s) Signed: 01/19/2019 11:03:58 AM By: Maye Hides RCP Entered By: Maye Hides on 01/16/2019 14:05:03 -------------------------------------------------------------------------------- Vitals Details Patient Name: Date of Service: [name] 01/16/2019 1:00 PM Medical Record Number:[id number] Patient Account Number: [id number] Date of Birth/Sex: Treating RN: March 12, 1971 (48 y.o. [name] Primary Care [name]: [name] Other Clinician: Referring [name]: Treating [name]/Extender:[name], [name] Weeks in Treatment: 19 Vital Signs Time Taken: 13:22 Temperature (F):  98 Height (in): 67 Pulse (bpm): 88 Weight (lbs): 125 Respiratory Rate (breaths/min): 16 Body Mass Index (BMI): 19.6 Blood Pressure (mmHg): 156/75 Capillary Blood Glucose (mg/dl): 90 Reference Range: 80 - 120 mg / dl Airway Pulse Oximetry (%): 100 Electronic Signature(s) Signed: 01/19/2019 11:03:58 AM By: Maye Hides RCP Entered By: Maye Hides on 01/16/2019 67:67:20

## 2019-01-19 NOTE — Progress Notes (Signed)
[name], [name] ([id number]) Visit Report for 01/16/2019 SuperBill Details Patient Name: Date of Service: [name], [name] 01/16/2019 Medical Record Number:[id number] Patient Account Number: [id number] Date of Birth/Sex: Treating RN: 12-16-1970 (48 y.o. Nancy Fetter Primary Care Provider: [name] Other Clinician: Referring Provider: Treating Provider/Extender:Modean Mccullum, [name], [name] in Treatment: 19 Diagnosis Coding ICD-10 Codes Code Description [phone] Other chronic osteomyelitis, right ankle and foot L97.514 Non-pressure chronic ulcer of other part of right foot with necrosis of bone E10.621 Type 1 diabetes mellitus with foot ulcer L02.415 Cutaneous abscess of right lower limb Facility Procedures CPT4 Code Description Modifier Quantity [id number] G0277-(Facility Use Only) HBOT, full body chamber, 30min 4 ICD-10 Diagnosis Description E10.621 Type 1 diabetes mellitus with foot ulcer Physician Procedures CPT4 Code Description Modifier Quantity [id number] 93818 - WC PHYS HYPERBARIC OXYGEN THERAPY 1 ICD-10 Diagnosis Description E10.621 Type 1 diabetes mellitus with foot ulcer Electronic Signature(s) Signed: 01/16/2019 5:33:39 PM By: Linton Ham MD Signed: 01/19/2019 11:03:58 AM By: Maye Hides RCP Entered By: Maye Hides on 01/16/2019 [place]

## 2019-01-19 NOTE — Progress Notes (Signed)
NAKYIA, DAU ([id number]) Visit Report for 01/16/2019 HBO Details Patient Name: Date of Service: [name], [name] 01/16/2019 1:00 PM Medical Record Number:[id number] Patient Account Number: [id number] Date of Birth/Sex: Treating RN: [birth year] (48 y.o. Nancy Fetter Primary Care Eriyanna Kofoed: [name] Other Clinician: Maye Hides Referring Obed Samek: Treating Ellie Spickler/Extender:[name], [name], [name] in Treatment: 19 HBO Treatment Course Details Treatment Course Number: 4 Ordering Taliya Mcclard: Linton Ham Total Treatments Ordered: 52 HBO Treatment Start Date: 09/30/2018 HBO Indication: Diabetic Ulcer(s) of the Lower Extremity HBO Treatment Details Treatment Number: 69 Patient Type: Outpatient Chamber Type: Monoplace Chamber Serial #: [id number] Treatment Protocol: 2.5 ATA with 90 minutes oxygen, with two 5 minute air breaks Treatment Details Compression Rate Down: 2.0 psi / minute De-Compression Rate Up: 2.5 psi / minute Air breaks and CompressTx Pressure breathing periods DecompressDecompress Begins Reached (leave unused spaces Begins Ends blank) Chamber Pressure (ATA)1 2.5 2.5 2.5 2.5 2.5 --2.5 1 Clock Time (24 hr) 13:30 13:41 [id number]:9390:3009:2330:07--62:26 15:29 Treatment Length: 119 (minutes) Treatment Segments: 4 Vital Signs Capillary Blood Glucose Reference Range: 80 - 120 mg / dl HBO Diabetic Blood Glucose Intervention Range: <131 mg/dl or >249 mg/dl Time Vitals Blood Respiratory Capillary Blood Glucose Pulse Action Type: Pulse: Temperature: Taken: Pressure: Rate: Glucose (mg/dl): Meter #: Oximetry (%) Taken: Pre 13:22 156/75 88 16 98 90 100 Post 15:33 161/63 82 16 98.3 243 100 Treatment Response Treatment Toleration: Well Treatment Completion Treatment Completed without Adverse Event Status: Treatment Notes Pt presented with BG OF 90mg /dl, Ayaansh Smail notified, pt given glucerna per protocol and BG rechecked in 30 min. BS at  166mg /dl after retake. Kenzel Ruesch notified and treatment proceeded. Additional Procedure Documentation Tissue Sevierity: Necrosis of bone [place] Notes No concerns with treatment given Physician HBO Attestation: I certify that I supervised this HBO treatment in accordance with Medicare guidelines. A trained Yes emergency response team is readily available per hospital policies and procedures. Continue HBOT as ordered. Yes Electronic Signature(s) Signed: 01/16/2019 5:33:39 PM By: Linton Ham MD Entered By: Linton Ham on 01/16/2019 17:17:59 -------------------------------------------------------------------------------- HBO Safety Checklist Details Patient Name: Date of Service: [name] 01/16/2019 1:00 PM Medical Record Number:[id number] Patient Account Number: [id number] Date of Birth/Sex: Treating RN: 1970/12/21 (48 y.o. Nancy Fetter Primary Care Breianna Delfino: [name] Other Clinician: Maye Hides Referring Cyd Hostler: Treating Shateka Petrea/Extender:[name], [name], [name] in Treatment: 19 HBO Safety Checklist Items Safety Checklist Consent Form Signed Patient voided / foley secured and emptied When did you last eato lunch Last dose of injectable or oral agent yesterday NA Ostomy pouch emptied and vented if applicable All implantable devices assessed, documented and approved NA Intravenous access site secured and place Valuables secured Linens and cotton and cotton/polyester blend (less than 51% polyester) Personal oil-based products / skin lotions / body lotions removed NA Wigs or hairpieces removed NA Smoking or tobacco materials removed Books / newspapers / magazines / loose paper removed Cologne, aftershave, perfume and deodorant removed Jewelry removed (may wrap wedding band) Make-up removed Hair care products removed NA Battery operated devices (external) removed NA Heating patches and chemical warmers removed NA Titanium  eyewear removed Nail polish cured greater than 10 hours NA Casting material cured greater than 10 hours NA Hearing aids removed NA Loose dentures or partials removed NA Prosthetics have been removed Patient demonstrates correct use of air break device (if applicable) Patient concerns have been addressed Patient grounding bracelet on and cord attached to chamber Specifics for Inpatients (complete in addition to above) Medication sheet sent with patient  Intravenous medications needed or due during therapy sent with patient Drainage tubes (e.g. nasogastric tube or chest tube secured and vented) Endotracheal or Tracheotomy tube secured Cuff deflated of air and inflated with saline Airway suctioned Electronic Signature(s) Signed: 01/19/2019 11:03:58 AM By: Maye Hides RCP Entered By: Maye Hides on 01/16/2019 16:22:20

## 2019-01-20 ENCOUNTER — Encounter (HOSPITAL_BASED_OUTPATIENT_CLINIC_OR_DEPARTMENT_OTHER): Payer: Medicare HMO | Admitting: Internal Medicine

## 2019-01-20 NOTE — Progress Notes (Signed)
[name], [name] ([id number]) Visit Report for 01/14/2019 Arrival Information Details Patient Name: Date of Service: [name], [name] 01/14/2019 1:00 PM Medical Record Number:[id number] Patient Account Number: [id number] Date of Birth/Sex: Treating RN: [birth year] (48 y.o. [name], Meta.Reding Primary Care Tasnia Spegal: [name] Other Clinician: Referring Rosi Secrist: Treating Malgorzata Albert/Extender:Stone III, Lennox Grumbles, [name] in Treatment: 19 Visit Information History Since Last Visit Added or deleted any medications: No Patient Arrived: Wheel Chair Any new allergies or adverse reactions: No Arrival Time: 12:50 Had a fall or experienced change in No activities of daily living that may affect Accompanied By: self risk of falls: Transfer Assistance: None Signs or symptoms of abuse/neglect since last No Patient Identification Verified: Yes visito Secondary Verification Process Completed: Yes Hospitalized since last visit: No Patient Requires Transmission-Based No Implantable device outside of the clinic excluding No Precautions: cellular tissue based products placed in the center Patient Has Alerts: No since last visit: Pain Present Now: No Electronic Signature(s) Signed: 01/20/2019 1:32:19 PM By: [name] EMT/HBOT Entered By: [name] on 01/14/2019 13:36:03 -------------------------------------------------------------------------------- Encounter Discharge Information Details Patient Name: Date of Service: [name] 01/14/2019 1:00 PM Medical Record Number:[id number] Patient Account Number: [id number] Date of Birth/Sex: Treating RN: 07-02-1970 (48 y.o. [name] Primary Care Hyun Marsalis: [name] Other Clinician: Referring Dafina Suk: Treating Alexandre Lightsey/Extender:Stone III, Lennox Grumbles, [name] in Treatment: 4 Encounter Discharge Information Items Discharge Condition: Stable Ambulatory Status: Wheelchair Discharge Destination:  Home Transportation: Private Auto Accompanied By: self Schedule Follow-up Appointment: Yes Clinical Summary of Care: Patient Declined Electronic Signature(s) Signed: 01/20/2019 1:32:19 PM By: [name] EMT/HBOT Entered By: [name] on 01/14/2019 16:50:09 -------------------------------------------------------------------------------- Patient/Caregiver Education Details Margorie John 10/14/2020andnbsp1:00 Patient Name: Date of Service: L. PM Medical Record Patient Account Number: [id number] Number: Treating RN: Deon Pilling Date of Birth/Gender: [birth year] (48 y.o. F) Other Clinician: Primary Care Physician:MOREIRA, [name] Treating Worthy Keeler Referring Physician: Physician/Extender: Juanito Doom in Treatment: 20 Education Assessment Education Provided To: Patient Education Topics Provided Hyperbaric Oxygenation: Methods: Explain/Verbal Responses: State content correctly Electronic Signature(s) Signed: 01/20/2019 1:32:19 PM By: [name] EMT/HBOT Entered By: [name] on 01/14/2019 16:49:57 -------------------------------------------------------------------------------- Vitals Details Patient Name: Date of Service: [name] 01/14/2019 1:00 PM Medical Record Number:[id number] Patient Account Number: [id number] Date of Birth/Sex: Treating RN: [birth year] (48 y.o. [name], Meta.Reding Primary Care Kavir Savoca: [name] Other Clinician: Referring Nephi Savage: Treating Brandn Mcgath/Extender:Stone III, Lennox Grumbles, [name] in Treatment: 19 Vital Signs Time Taken: 12:55 Temperature (F): 98.5 Height (in): 67 Pulse (bpm): 96 Weight (lbs): 125 Respiratory Rate (breaths/min): 17 Body Mass Index (BMI): 19.6 Blood Pressure (mmHg): 135/68 Capillary Blood Glucose (mg/dl): 240 Reference Range: 80 - 120 mg / dl Electronic Signature(s) Signed: 01/20/2019 1:32:19 PM By: [name] EMT/HBOT Entered By: [name] on  01/14/2019 13:36:19

## 2019-01-20 NOTE — Progress Notes (Signed)
[name], [name] ([id number]) Visit Report for 01/15/2019 SuperBill Details Patient Name: Date of Service: [name], [name] 01/15/2019 Medical Record Number:[id number] Patient Account Number: [id number] Date of Birth/Sex: Treating RN: 05-26-1970 (48 y.o. Nancy Fetter Primary Care Provider: [name] Other Clinician: Referring Provider: Treating Provider/Extender:Temiloluwa [place], [name], [name] in Treatment: 19 Diagnosis Coding ICD-10 Codes Code Description [phone] Other chronic osteomyelitis, right ankle and foot L97.514 Non-pressure chronic ulcer of other part of right foot with necrosis of bone E10.621 Type 1 diabetes mellitus with foot ulcer L02.415 Cutaneous abscess of right lower limb Facility Procedures CPT4 Code Description Modifier Quantity [id number] G0277-(Facility Use Only) HBOT, full body chamber, 47min 4 Physician Procedures CPT4 Code Description Modifier Quantity 0964383 81840 - WC PHYS HYPERBARIC OXYGEN THERAPY 1 ICD-10 Diagnosis Description E10.621 Type 1 diabetes mellitus with foot ulcer Electronic Signature(s) Signed: 01/15/2019 5:35:18 PM By: Linton Ham MD Signed: 01/20/2019 1:32:19 PM By: [name] EMT/HBOT Entered By: [name] on 01/15/2019 16:14:51

## 2019-01-20 NOTE — Progress Notes (Signed)
[name] ([id number]) Visit Report for 01/15/2019 Arrival Information Details Patient Name: Date of Service: [name] 01/15/2019 1:00 [name] Medical Record Number:[id number] Patient Account Number: [id number] Date of Birth/Sex: Treating RN: [birth year] (48 y.o. [name] Primary Care Philicia Heyne: [name] Other Clinician: Referring Joycelynn Fritsche: Treating Freeland Pracht/Extender:[name], [name], [name] in Treatment: 19 Visit Information History Since Last Visit Added or deleted any medications: No Patient Arrived: Wheel Chair Any new allergies or adverse reactions: No Arrival Time: 13:00 Had a fall or experienced change in No activities of daily living that may affect Accompanied By: self risk of falls: Transfer Assistance: None Signs or symptoms of abuse/neglect since last No Patient Identification Verified: Yes visito Secondary Verification Process Completed: Yes Hospitalized since last visit: No Patient Requires Transmission-Based No Implantable device outside of the clinic excluding No Precautions: cellular tissue based products placed in the center Patient Has Alerts: No since last visit: Pain Present Now: No Electronic Signature(s) Signed: 01/20/2019 1:32:19 [name] By: [name] EMT/HBOT Entered By: [name] on 01/15/2019 13:50:21 -------------------------------------------------------------------------------- Encounter Discharge Information Details Patient Name: Date of Service: [name] 01/15/2019 1:00 [name] Medical Record Number:[id number] Patient Account Number: [id number] Date of Birth/Sex: Treating RN: 30-Dec-1970 (48 y.o. [name] Primary Care Retina Bernardy: [name] Other Clinician: Referring Aries Kasa: Treating Senay Sistrunk/Extender:[name], [name], [name] in Treatment: 79 Encounter Discharge Information Items Discharge Condition: Stable Ambulatory Status: Wheelchair Discharge Destination:  Home Transportation: Private Auto Accompanied By: self Schedule Follow-up Appointment: Yes Clinical Summary of Care: Patient Declined Electronic Signature(s) Signed: 01/20/2019 1:32:19 [name] By: [name] EMT/HBOT Entered By: [name] on 01/15/2019 16:15:31 -------------------------------------------------------------------------------- Patient/Caregiver Education Details [name] 10/15/2020andnbsp1:00 Patient Name: Date of Service: [name] Medical Record Patient Account Number: [id number] Number: Treating RN: [name] Date of Birth/Gender: 1971/01/06 (48 y.o. F) Other Clinician: Primary Care Physician:MOREIRA, [name] Treating Linton Ham Referring Physician: Physician/Extender: Juanito Doom in Treatment: 75 Education Assessment Education Provided To: Patient Education Topics Provided Hyperbaric Oxygenation: Methods: Explain/Verbal Responses: State content correctly Electronic Signature(s) Signed: 01/20/2019 1:32:19 [name] By: [name] EMT/HBOT Entered By: [name] on 01/15/2019 16:15:09 -------------------------------------------------------------------------------- Vitals Details Patient Name: Date of Service: [name] 01/15/2019 1:00 [name] Medical Record Number:[id number] Patient Account Number: [id number] Date of Birth/Sex: Treating RN: 12-Nov-1970 (48 y.o. [name] Primary Care Worley Radermacher: [name] Other Clinician: Referring Bronsen Serano: Treating Anyjah Roundtree/Extender:[name], [name], [name] in Treatment: 19 Vital Signs Time Taken: 13:05 Temperature (F): 98.5 Height (in): 67 Pulse (bpm): 91 Weight (lbs): 125 Respiratory Rate (breaths/min): 15 Body Mass Index (BMI): 19.6 Blood Pressure (mmHg): 144/82 Capillary Blood Glucose (mg/dl): 155 Reference Range: 80 - 120 mg / dl Electronic Signature(s) Signed: 01/20/2019 1:32:19 [name] By: [name] EMT/HBOT Entered By: [name]  on 01/15/2019 13:50:38

## 2019-01-21 ENCOUNTER — Encounter (HOSPITAL_BASED_OUTPATIENT_CLINIC_OR_DEPARTMENT_OTHER): Payer: Medicare HMO | Admitting: Physician Assistant

## 2019-01-21 LAB — AEROBIC CULTURE W GRAM STAIN (SUPERFICIAL SPECIMEN)

## 2019-01-21 NOTE — Progress Notes (Signed)
Nancy, [name] ([id number]) Visit Report for 01/19/2019 HPI Details Patient Name: Date of Service: Nancy, [name] 01/19/2019 1:15 PM Medical Record Number:[id number] Patient Account Number: [id number] Date of Birth/Sex: Treating RN: 07/21/1970 (48 y.o. Nancy Fetter Primary Care Provider: [name] Other Clinician: Referring Provider: Treating Provider/Extender:Calven Gilkes, [name], [name] in Treatment: 33 History of Present Illness HPI Description: 48 year old diabetic who is known to have type 1 diabetes which is poorly controlled last hemoglobin A1c was 11%. She comes in with a ulcerated area on the left lateral foot which has been there for over 6 months. Was recently she has been treated by [name] of podiatry who saw her last on 05/28/2016. Review of his notes revealed that the patient had incision and drainage with placement of antibiotic beads to the left foot on 04/11/2016 for possible osteomyelitis of the cuboid bone. Over the last year she's had a history of amputation of the left fifth toe and a femoropopliteal popliteal bypass graft somewhere in April 2017. 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 His note [name] mentions that the patient has been referred to me for further wound care and possibly great candidate for hyperbaric oxygen therapy due to recurrent osteomyelitis. However we do not have any x-rays of biopsy reports confirming this. He has been on several antibiotics including Bactrim and most recently is on doxycycline for an MRSA. I understand, the patient was not a candidate for IV antibiotics as she has had previous PICC lines which resulted in blood clots in both arms. There was a x-ray report dated 04/04/2016 on [name] notes which showed evidence of fifth ray resection left foot with osteolytic changes noted to the fourth metatarsal and cuboid bone on the left. 06/13/2016 -- had a left foot x-ray which showed  no acute fracture or dislocation and no definite radiographic evidence of osteomyelitis. Advanced osteopenia was seen. 06/20/2016 -- she has noticed a new wound on the right plantar foot in the region where she had a callus before. 06/27/16- the patient did have her x-ray of the right foot which showed no findings to suggest osteomyelitis. She saw her endocrinologist, [name], yesterday. Her A1c in January was 11. He also indicates mismanagement and noncompliance regarding her diabetes. She is currently on Bactrim for a lip infection. She is complaining of nausea, vomiting and diarrhea. She is unable to articulate the exact orders or dosing of the Bactrim; it is unclear when she will complete this. 07/04/2016 -- results from [facility] of ABIs with ankle waveforms were noted from 02/14/2016. The examination done on 06/27/2015 showed noncompressible ABIs with the right being 1.45 and the left being 1.33. The present examination showed a right ABI of 1.19 on the left of 1.33. The conclusion was that right normal ABI in the lower extremity at rest however compared to previous study which was noncompressible ABI may be falsely elevated side suggesting medial calcification. The left ABI suggested medial calcification. 08/01/2016 -- the patient had more redness and pain on her right foot and did not get to come to see as noted she see her PCP or go to the ER and decided to take some leftover metronidazole which she had at home. As usual, the patient does report she feels and is rather noncompliant. 08/08/2016 -- -- x-ray of the right foot -- FINDINGS:Transmetatarsal amputation is noted. No bony destruction is noted to suggest osteomyelitis. IMPRESSION: No evidence of osteomyelitis. Postsurgical changes are seen. MRI would be more sensitive  for possible bony changes. Culture has grown Serratia Marcescens -- sensitive to Bactrim, ciprofloxacin, ceftazidime she was seen by [name] on  08/06/2016. He did not find any exposed bone, muscle, tendon, ligament or joint. There was no malodor and he did a excisional debridement in the office. ============ Old notes: 48 year old patient who is known to the wound clinic for a while had been away from the wound clinic since 09/01/2014. Over the last several months she has been admitted to various hospitals including [place] at [facility]. She was treated for a right metatarsal osteomyelitis with a transmetatarsal amputation and this was done about 2 months ago. He has a small ulcerated area on the right heel and she continues to have an ulcerated area on the left plantar aspect of the foot. The patient was recently admitted to the [facility] hospital group between 7/12 and 10/18/2014. she was given 3 weeks of IV vancomycin and was to follow-up with her surgeons at [facility] and also took oral vancomycin for C. difficile colitis. 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 MRI of the left foot done on 10/14/2014 showed no abscess or osteomyelitis. 04/27/15; this is a patient we know from previous stays in the wound care center. She is a type I diabetic I am not sure of her control currently. Since the last time I saw her she is had a right transmetatarsal amputation and has no wounds on her right foot and has no open wounds. She is been followed at the wound care center at [facility] in [place]. She comes today with the desire to undergo hyperbaric treatment locally. Apparently one of her wound care providers in [place] has suggested hyperbarics. This is in response to an MRI from 04/18/15 that showed increased marrow signal and loss of the proximal  fifth metatarsal cortex evidence of osteomyelitis with likely early osteomyelitis in the cuboid bone as well. She has a large wound over the base of the fifth metatarsal. She also has a eschar over her the tips of her toes on 1,3 and 5. She does not have peripheral pulses and apparently is going for an angiogram tomorrow which seems reasonable. After this she is going to infectious disease at [facility]. They have been using Medihoney to the large wound on the lateral aspect of the left foot to. The patient has known Charcot deformity from diabetic neuropathy. She also has known diabetic PAD. Surprisingly I can't see that she has had any recent antibiotics, the patient states the last antibiotic she had was at the end of November for 10 days. I think this was in response to culture that showed group G strep although I'm not exactly sure where the culture was from. She is also had arterial studies on 03/29/15. This showed a right ABI of 1.4 that was noncompressible. Her left ABI was 0.73. 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 It was not felt that arterial inflow was adequate for healing of a foot ulcer. Her Doppler waveforms looked monophasic ===== READMISSION 02/28/17; this is in an now 48 year old woman we've had at several different occasions in this clinic. 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 She has a remote ex-smoker. She was last seen in this  clinic by Dr. Con Memos I think in May. More recently she is been followed by her podiatrist [name] an infectious disease Dr. Megan Salon. She has 2 open wounds the major one is over the right first metatarsal head she also has a wound on the left plantar foot. an MRI of the right foot on 01/01/17 showed a soft tissue ulcer along the plantar aspect of the first metatarsal base consistent with osteomyelitis of the first metatarsal stump. Dr. 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 According to the patient this is been open for slightly over a month. She has been on a combination of Cipro 500 twice a day, Zyvox 600 twice a day and Flagyl 500 3 times a day for over a month now as directed by Dr. Megan Salon. cultures of the right foot earlier this year showed MRSA in January and Serratia in May. January also had a few viridans strep. Recent x-rays of both feet were done and [name] office and I don't have these reports. The patient has known PAD and has a history of aleft femoropopliteal bypass in April 2017. 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 Dwyane Dee. In spite of this the last hemoglobin A1c I can see is 10.1 on 01/01/2017. She is being referred by 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 She is been using Medihoney to this area and also an area on the left midfoot. She is using healing sandals bilaterally. ABIs in this clinic at the left posterior tibial was 1.1 noncompressible on the right READMISSION Non invasive vascular NOVANT 5/18 Aftercare following surgery of the circulatory system Procedure Note - Interface, External Ris In - 08/13/2016 11:05 AM EDT Procedure: Examination consists of physiologic resting arterial pressures of the brachial and ankle arteries bilaterally with continuous wave Doppler waveform analysis. Previous: Previous exam performed on 02/14/16 demonstrated ABIs of Rt = 1.19 and Lt = 1.33. Right: ABI = non-compressible PT, 1.47 DP. S/P transmet amputation. Left: ABI = 1.52, 2nd digit pressure = 87 mmHg Conclusions: Right: ABI (>1.3) may be falsely elevated, suggesting medial calcification. Left: ABI (>1.3) may be  falsely elevated, suggesting medial calcification The patient is a now 48 year old type I diabetic is had multiple issues her graded to chronic diabetic foot ulcers. 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 We had her in the clinic lastin November. At that point she had wounds on her bilateral feet.she had wanted to try hyperbarics however the healogics review process denied her because she hadn't followed up with her vascular surgeon for her left femoropopliteal bypass. The bypass was done by Dr. Raul Del at [facility]. We made her a follow-up with Dr. Raul Del however she did not keep the appointment and therefore she was not approved The patient shows me a small wound on her left fourth metatarsal head on her phone. She developed rapid discoloration in the plantar aspect of the left foot and she was admitted to hospital from 2/2 through 05/10/17 with wet gangrene of the left foot osteomyelitis of the fourth metatarsal heads. She was admitted acutely ill with a temperature of 103. She was started on broad-spectrum vancomycin and cefepime. On 05/06/17  she was taken to the OR by [name] her podiatric surgeon for an incision and drainage irrigation of the left foot wound. Cultures from this surgery revealed group be strep and anaerobes. she was seen by Dr.Xu of orthopedic surgery and scheduled for a below-knee amputation which she u refused. Ultimately she was discharged on Levaquin and Flagyl for one month. MRI 05/05/17 done while she was in the hospital showed abscess adjacent to the fourth metatarsal head and neck small abscess around the fourth flexor tendon. Inflammatory phlegmon and gas in the soft tissues along the lateral aspect of the fourth phalanx. Findings worrisome for osteomyelitis involving the fourth proximal and middle phalanx and also the third and fourth metatarsals. Finally the patient had actually  shortly before this followed up with Dr. Raul Del at no time on 04/29/17. He felt that her left femoropopliteal bypass was patent he felt that her left-sided toe pressures more than adequate for healing a wound on the left foot. This was before her acute presentation. 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 05/28/17; she is started hyperbarics. The patient tells me that for some reason she was not actually on Levaquin but I think on ciprofloxacin. She was on Flagyl. She only started her Levaquin yesterday due to some difficulty with the pharmacy and perhaps her sister picking it up. She has an appointment with [name] tomorrow and with infectious disease early next week. She has no new complaints 06/06/17; the patient continues in hyperbarics. She saw [name] on 05/29/17 who is her podiatric surgeon. He is elected for a transmetatarsal amputation on 06/27/17. I'm not sure at what level he plans to do this amputation. The patient is unaware She also saw Dr. Megan Salon of infectious disease who elected to continue her on current antibiotics I think this is ciprofloxacin and Flagyl. I'll need to clarify with her tomorrow if she actually has this. We're using silver alginate to the actual wound. Necrotic surface today with material under the flap of her foot. Original MRI showed abscesses as well as osteomyelitis of the proximal and middle fourth phalanx and the third and fourth metatarsal heads 06/11/17; patient continues in hyperbarics and continues on oral antibiotics. She is doing well. The wound looks better. The necrotic part of this under the flap in her superior foot also looks better. she is been to see [name]. I haven't had a chance to look at his note. Apparently he has put the transmetatarsal amputation on hold her request it is still planning to take her to the OR for debridement and product application ACEL. I'll see if I can find his note. I'll therefore leave product ordering/requests  to [name] for now. I was going to look at Dermagraft 06/18/17-she is here in follow-up evaluation for bilateral foot wounds. She continues with hyperbaric therapy. She states she has been applying manuka honey to the right plantar foot and alternate manuka honey and silver alginate to the left foot, despite our orders. We will continue with same treatment plan and she will follo up next week. 06/25/17; I have reviewed [name] last note from 3/11. She has operative debridement in 2 days' time. By review his note apparently they're going to place there is skin over the majority of this wound which is a good choice. She has a small satellite area at the most proximal part of this wound on the left plantar foot. The area on the right plantar foot we've been using silver alginate and it is  close to healing. 07/02/17; unfortunately the patient was not easily approved for [name] proposed surgery. I'm not completely certain what the issue is. She has been using silver alginate to the wound she has completed a first course of hyperbarics. She is still on Levaquin and Flagyl. I have really lost track of the time course here.I suspect she should have another week to 2 of antibiotics. I'll need to see if she is followed up with infectious disease Dr. Megan Salon 07/09/17; the patient is followed up with Dr. Megan Salon. 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 She continues on Levaquin and metronidazole continuing both of these for now I think she is been on fr about 6 weeks. She still has some drainage but no pain. No fever. Her had been plans for her to go to the OR for operative debridement with her podiatrist [name], I am not exactly sure where that is. I'll probably slip a note to [name] today. I note that she follows with Dr. Dwyane Dee of endocrinology. We have her recertified for hyperbaric oxygen. I have not heard about Dermagraft however I'll see if [name]  is planning a skin substitute as well 07/16/17; the patient tells me she is just about out of Plessis. I'll need to check Dr. Hale Bogus last notes on this. She states she has plenty of Flagyl however. She comes in today complaining of pain in the right lateral foot which she said lasted for about a day. The wound on the right foot is actually much more medially. She also tells me that the [facility] cost a lot of pain in the left foot wound and she turned back to silver alginate. Finally Dermagraft has a $947 per application co-pay. She cannot afford this 07/23/17; patient arrives today with the wound not much smaller. There is not much new to add. She has not heard from [name] all try to put in a call to them today. She was asking about Dermagraft again and she has an over $096 per application co-pay she states that she would be willing to try to do a payment plan. I been tried to avoid this. We've been using silver alginate, I'll change to [facility] 07/30/17-She is here in follow-up evaluation for left foot ulcer. She continues hyperbaric medicine. The left foot ulcer is stable we will continue with same treatment plan 08/06/17; she is here for evaluation of her left foot ulcer. Currently being treated for hyperbarics or underlying osteomyelitis. She is completed antibiotics. The left foot ulcer is better smaller with healthier looking granulation. For various reasons I am not really clear on we never got her back to the OR with [name]. He did not respond to my secure text message. Nevertheless I think that surgery on this point is not necessary nor am I completely clear that a skin substitute is necessary The patient is complaining about pain on the outside of her right foot. 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 There is no erythema. She also states the foot is warm versus her other part of her upper leg and this is largely true. It is not totally clear to me  what's causing this. She thinks it's different from her usual neuropathy pain 08/13/17; she arrives in clinic today with a small wound which is superficial on her right first metatarsal head. 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 She tells [place] she was up on  her feet over the Mother's Day celebration. The large wound is on the left foot. Continues with hyperbarics for underlying osteomyelitis. We're using Hydrofera Blue. She asked me today about where we were with Dermagraft. I had actually excluded this because of the co- pay however she wants to assume this therefore I'll recheck the co-pay an order for next week. 08/20/17; the patient agreed to accept the co-pay of the first Apligraf which we applied today. She is disappointed she is finishing hyperbarics will run this through the insurance on the extent of the foot infection and the extent of the wound that she had however she is already had 60 dive's. Dermagraft No. 1 08/27/17; Dermagraft No. 2. She is not eligible for any more hyperbaric treatments this month. She reports a fair amount of drainage and she actually changed to the external dressings without disturbing the direct contact layer 09/03/17; the patient arrived in clinic today with the wound superficially looking quite healthy. Nice vibrant red tissue with some advancing epithelialization although not as much adherence of the flap as I might like. However she noted on her own fourth toe some bogginess and she brought that to our attention. Indeed this was boggy feeling like a possibility of subcutaneous fluid. 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 She is not been unwell. We've been using Dermagraft 09/10/17; the culture that I did not last week was MRSA. She saw Dr. Megan Salon this morning who is going to start her on vancomycin. I had sent him a secure a text message yesterday. I also spoke  with her podiatric surgeon [name] about surgery on this foot the options for conserving a functional foot etc. Promised me he would see her and will make back consultation today. Paradoxically her actual wound on the plantar aspect of her left foot looks really quite good. I had given her 5 days worth of Baxdella to cover her for MRSA. Her MRI came back showing osteomyelitis within the third metatarsal shaft and head and base of the third and fourth proximal phalanx. She had extensive inflammatory changes throughout the soft tissue of the lateral forefoot. With an ill-defined fluid around the fourth metatarsal extending into the plantar and dorsal soft tissues 09/19/17; the patient is actually on oral Septra and Flagyl. She apparently refused IV vancomycin. She also saw [name] at my request who is planning her for a left BKA sometime in mid July. MRI showed osteomyelitis within the third metatarsal shaft and head and the basis of the third and fourth proximal phalanx. I believe there was felt to be possible septic arthritis involving the third MTP. 09/26/17; the patient went back to Dr. Megan Salon at my suggestion and is now receiving IV daptomycin. Her wound continues to look quite good making the decision to proceed with a transmetatarsal amputation although more difficult for the patient. I believe in my extensive discussions with her she has a good sense of the pros and cons of this. I don't NV the tuft decision she has to make. She has an appointment with [name] I believe in mid July and I previously spoken to him about this issue Has we had used 3 previous Dermagraft. Given the condition of the wound surface I went ahead and added the fourth one today area and I did this not fully realizing that she'll be traveling to [place] next week. I'm hopeful she can come  back in 2 weeks 10/21/17; Her same Dermagraft on for about 3-1/2 weeks. In spite of this the wound arrives looking quite  healthy. There is been a lot of healing dimensions are smaller. Looking at the square shaped wound she has now there is some undermining and some depth medially under the undermining although I cannot palpate any bone. No surrounding infection is obvious. She has difficult questions about how to look at this going forward vis--vis amputations versus continued medical therapy. To be truthful the wound is looks so healthy and it is continued to contract. Hard to justify foot surgery at this point although I still told her that I think it might come to that if we are not able to eradicate the underlying MRSA. She is still highly at risk and she understands this 11/06/17 on evaluation today patient appears to be doing better in regard to her foot ulcer. She's been tolerating the dressing changes without complication. Currently she is here for her Dermagraft #6. Her wound continues to make excellent progress at this point. She does not appear to have any evidence of infection which is good news. 11/13/17 on evaluation today patient appears to be doing excellent at this time. She is here for repeat Dermagraft application. This is #7. Overall her wound seems to be making great progress. 12/05/17; the patient arrives with the wound in much better condition than when I last saw this almost 6 weeks ago. She still has a small probing area in the left metatarsal head region on the lateral aspect of her foot. We applied her last Dermagraft today. Since the last time she is here she has what appears to a been a blood blister on the plantar aspect of left foot although I don't see this is threatening. There is also a thick raised tissue on the right mid metatarsal head region. This was not there I don't think the last time she was here 3 weeks ago. 12/12/17; the patient continues to have a small programming area in the left metatarsal head region on the lateral aspect of her foot which was the initial large surgical  wound. I applied her last Apligraf last week. I'm going to use Endoform starting today Unfortunately she has an excoriated area in the left mid foot and the right mid foot. The left midfoot looks like a blistered area this was not opened last week it certainly is open today. Using silver alginate on these areas. She promises me she is offloading this. 12/19/17; the small probing area in the left metatarsal head eyes think is shallower. In general her original wound looks better. We've been using Endoform. The area inferiorly that I think was trauma last week still requires debridement a lot of nonviable surface which I removed. She still has an open open area distally in her foot Similarly on the right foot there is tightly adherent surface debris which I removed. Still areas that don't look completely epithelialized. This is a small open area. We used silver alginate on these areas 12/26/2017; the patient did not have the supplies we ordered from last week including the Endoform. The original large wound on the left lateral foot looks healthy. She still has the undermining area that is largely unchanged from last week. She has the same heavily callused raised edged wounds on the right mid and left midfoot. Both of these requiring debridement. We have been using silver alginate on these areas 01/02/2018; there is still supply issues. We are going to try  to use Prisma but I am not sure she actually got it from what she is saying. She has a new open area on the lateral aspect of the left fourth toe [previous fifth ray amputation]. Still the one tunneling area over the fourth metatarsal head. The area is in the midfoot bilaterally still have thick callus around them. She is concerned about a raised swelling on the lateral aspect of the foot. However she is completely insensate 01/10/2018; we are using Prisma to the wounds on her bilateral feet. Surprisingly the tunneling area over the left fourth  metatarsal head that was part of her original surgery has closed down. She has a small open area remaining on the incision line. 2 open areas in the midfoot. 02/10/2018; the patient arrives back in clinic after a month hiatus. She was traveling to visit family in [place]. Is fairly clear she was not offloading the areas on her feet. The original wound over the left lateral foot at the level of metatarsal heads is reopened and probes medially by about a centimeter or 2. She notes that a week ago she had purulent drainage come out of an area on the left midfoot. Paradoxically the worst area is actually on the right foot is extensive with purulent drainage. We will use silver alginate today 02/17/2018; the patient has 3 wounds one over the left lateral foot. She still has a small area over the metatarsal heads which is the remnant of her original surgical wound. This has medial probing depth of roughly 1.4 cm somewhat better than last week. The area on the right foot is larger. We have been using silver alginate to all areas. The area on the right foot and left foot that we cultured last week showed both Klebsiella and Proteus. Both of these are quinolone sensitive. The patient put her's self on Bactrim and Flagyl that she had left hanging around from prior antibiotic usages. She was apparently on this last week when she arrived. I did not realize this. Unfortunately the Bactrim will not cover either 1 of these organisms. We will send in Cipro 500 twice daily for a week 03/04/2018; the patient has 2 wounds on the left foot one is the original wound which was a surgical wound for a deep DFU. At one point this had exposed bone. She still has an area over the fourth metatarsal head that probes about 1.4 cm although I think this is better than last week. I been using silver nitrate to try and promote tissue adherence and been using silver alginate here. She also has an area in the left midfoot. This has  some depth but a small linear wound. Still requiring debridement. On the right midfoot is a circular wound. A lot of thick callus around this area. We have been using silver alginate to all wound areas She is completed the ciprofloxacin I gave her 2 weeks ago. 03/11/2018; the patient continues to have 2 open areas on the left foot 1 of which was the original surgical wound for a deep DFU. Only a small probing area remains although this is not much different from last week we have been using silver alginate. The other area is on the midfoot this is smaller linear but still with some depth. We have been using silver alginate here as well On the right foot she has a small circular wound in the mid aspect. This is not much smaller than last time. We have been using silver alginate here as well  03/18/2018; she has 3 wounds on the left foot the original surgical wound, a very superficial wound in the mid aspect and then finally the area in the mid plantar foot. She arrives in today with a very concerning area in the wound in the mid plantar foot which is her most proximal wound. There is undermining here of roughly 1-1/2 cm superiorly. Serosanguineous drainage. She tells me she had some pain on for over the weekend that shot up her foot into her thigh and she tells me that she had a nodule in the groin area. She has the single wound in the right foot. We are using endoform to both wound areas 03/24/2018; the patient arrives with the original surgical wound in the area on the left midfoot about the same as last week. There is a collection of fluid under the surface of the skin extending from the surgical wound towards the midfoot although it does not reach the midfoot wound. The area on the right foot is about the same. Cultures from last week of the left midfoot wound showed abundant Klebsiella abundant Enterococcus faecalis and moderate methicillin resistant staph I gave her Levaquin but this would  have only covered the Klebsiella. She will need linezolid 04/01/2018; she is taking linezolid but for the first few days only took 1 a day. I have advised her to finish this at twice daily dosing. In any case all of her wounds are a lot better especially on the left foot. The original surgical wound is closed. The area on the left midfoot considerably smaller. The area on the right foot also smaller. 04/08/2018; her original surgical wound/osteomyelitis on the left foot remains closed. She has area on the left foot that is in the midfoot area but she had some streaking towards this. This is not connected with her original wound at least not visually. Small wound on the right midfoot appears somewhat smaller. 04/15/18; both wounds looks better. Original wound is better left midfoot. Using silver alginate 1/21; patient states she uses saltwater soak in, stones or remove callus from around her wounds. She is also concerned about a blood blister she had on the left foot but it simply resolved on its own. We've been using silver alginate 1/28; the patient arrives today with the same streaking area from her metatarsals laterally [the site of her original surgical wound] down to the middle of her foot. There is some drainage in the subcutaneous area here. This concerns me that there is actually continued ongoing infection in the metatarsals probably the fourth and third. This fixates an MRI of the foot without contrast [chronic renal failure] The wound in the mid part of the foot is small but I wonder whether this area actually connects with the more distal foot. The area on the right midfoot is probably about the same. Callus thick skin around the small wound which I removed with a curette we have been using silver alginate on both wound areas 2/4; culture I did of the draining site on the left foot last time grew methicillin sensitive staph aureus. MRI of the left foot showed interval resolution of the  findings surrounding the third metatarsal joint on the prior study consistent with treated osteomyelitis. Chronic soft tissue ulceration in the plantar and lateral aspect of the forefoot without residual focal fluid collection. No evidence of recurrent osteomyelitis. Noted to have the previous amputation of the distal first phalanx and fifth ray MRI of the right foot showed no evidence of  osteomyelitis I am going to treat the patient with a prolonged course of antibiotics directed against MSSA in the left foot 2/11; patient continues on cephalexin. She tells me she had nausea and vomiting over the weekend and missed 2 days. In general her foot looks much the same. She has a small open area just below the left fourth metatarsal head. A linear area in the left midfoot. Some discoloration extending from the inferior part of this into the left lateral foot although this appears to be superficial. She has a small area on the right midfoot which generally looks smaller after debridement 2/18; the patient is completing his cephalexin and has another 2 days. She continues to have open areas on the left and right foot. 2/25; she is now off antibiotics. The area on the left foot at the site of her original surgical wound has closed yet again. She still has open areas in the mid part of her foot however these appear smaller. The area on the right mid foot looks about the same. We have been using silver alginate She tells me she had a serious hypoglycemic spell at home. She had to have EMS called and get IV dextrose 3/3; disappointing on the left lateral foot large area of necrotic tissue surrounding the linear area. This appears to track up towards the same original surgical wound. Required extensive debridement. The area on the right plantar foot is not a lot better also using silver 3/12; the culture I did last time showed abundant enterococcus. I have prescribed Augmentin, should cover any unrecognized  anaerobes as well. In addition there were a few MRSA and Serratia that would not be well covered although I did not want to give her multiple antibiotics. She comes in today with a new wound in the right midfoot this is not connected with the original wound over her MTP a lot of thick callus tissue around both wounds but once again she said she is not walking on these areas 3/17-Patient comes in for follow-up on the bilateral plantar wounds, the right midfoot and the left plantar wound. Both these are heavily callused surrounding the wounds. We are continuing to use silver alginate, she is compliant with offloading and states she uses a wheelchair fairly often at home 3/24; both wound areas have thick callus. However things actually look quite a bit better here for the majority of her left foot and the right foot. 3/31; patient continues to have thick callused somewhat irritated looking tissue around the wounds which individually are fairly superficial. There is no evidence of surrounding infection. We have been using silver alginate however I change that to [facility] today 4/17; patient returns to clinic after having a scare with Covid she tested negative in her primary doctor's office. She has been using Hydrofera Blue. She does not have an open area on the right foot. On the left foot she has a small open area with the mid area not completely viable. She showed me pictures of what looks like a hemorrhagic blister from several days ago but that seems to have healed over this was on the lateral left foot 4/21; patient comes in to clinic with both her wounds on her feet closed. However over the weekend she started having pain in her right foot and leg up into the thigh. She felt as though she was running a low-grade fever but did not take her temperature. She took a doxycycline that she had leftover and yesterday a single Septra  and metronidazole. She thinks things feel somewhat  better. 4/28; duplex ultrasound I ordered last week was negative for DVT or superficial thrombophlebitis. She is completed the doxycycline I gave her. States she is still having a lot of pain in the right calf and right ankle which is no better than last week. She cannot sleep. She also states she has a temperature of up to 101, coughing and complaining of visual loss in her bilateral eyes. Apparently she was tested for Covid 2 weeks ago at [facility] and that was negative. Readmission: 09/03/18 patient presents back for reevaluation after having been evaluated at the end of April regarding erythema and swelling of her right lower extremity. Subsequently she ended up going to the hospital on 07/29/18 and was admitted not to be discharged until 08/08/18. Unfortunately it was noted during the time that she was in the hospital that she did have methicillin-resistant Staphylococcus aureus as the infection noted at the site. It was also determined that she did have osteomyelitis which appears to be fairly significant. She was treated with vancomycin and in fact is still on IV vancomycin at dialysis currently. This is actually slated to continue until 09/12/18 at least which will be the completion of the six weeks of therapy. Nonetheless based on what I'm seeing at this point I'm not sure she will be anywhere near ready to discontinue antibiotics at that time. Since she was released from the hospital she was seen by [name] who is her podiatrist on 08/27/18. His note specifically states that he is recommended that the patient needs of one knee amputation on the right as she has a life-threatening situation that can lead quickly to sepsis. The patient advised she would like to try to save her leg to which [name] apparently told her that this was against all medical advice. She also want to discontinue the Wound VAC which had been initiated due to the fact that she wasn't pleased with how the wound was looking  and subsequently she wanted to pursue applying Medihoney at that time. He stated that he did not believe that the right lower extremity was salvageable and that the patient understood but would still like to attempt hyperbaric option therapy if it could be of any benefit. She was therefore referred back to [place] for further evaluation. He plans to see her back next week. Upon inspection today patient has a significant amount purulent drainage noted from the wound at this point. The bone in the distal portion of her foot also appears to be extremely necrotic and spongy. When I push down on the bone it bubbles and seeps purulent drainage from deeper in the end of the foot. I do not think that this is likely going to heal very well at all and less aggressive surgical debridement were undertaken more than what I believe we can likely do here in our office. 09/12/2018; I have not seen this patient since the most recent hospitalization although she was in our clinic last week. I have reviewed some of her records from a complex hospitalization. 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 There is situation was complicated by MRSA bacteremia and acute on chronic renal failure now on dialysis. She is receiving vancomycin at dialysis. We started her on Dakin's wet-to- dry last week she is changing this daily. There is still purulent drainage coming out of her foot. Although she is apparently "agreeable" to a below-knee amputation which  is been suggested by multiple clinicians she wants this to be done in [place]. She apparently has a telehealth visit with that provider sometime in late [place] 6/24. I have told her I think this is probably too long. Nevertheless I could not convince her to allow a local doctor to perform BKA. 09/19/2018; the patient has a large necrotic area on the right anterior foot. She has had previous transmetatarsal amputations. Culture I did  last week showed MRSA nothing else she is on vancomycin at dialysis. She has continued leaking purulent drainage out of the distal part of the large circular wound on the right anterior foot. She apparently went to see Dr. 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 Somehow that translated into her being referred to plastic surgery for debridement of the area. I gather she basically refused amputation although I do not have a copy of Dr. Berenice Primas notes. The patient really wants to have a trial of hyperbaric oxygen. I agreed with initial assessment in this clinic that this was probably too far along to benefit however if she is going to have plastic surgery I think she would benefit from ancillary hyperbaric oxygen. The issue here is that the patient has benefited as maximally as any patient I have ever seen from hyperbaric oxygen therapy. Most recently she had exposed bone on the lateral part of her left foot after a surgical procedure and that actually has closed. She has eschared areas in both heels but no open area. She is remained systemically well. I am not optimistic that anything can be done about this but the patient is very clear that she wants an attempt. The attempt would include a wound VAC further debridements and hyperbaric oxygen along with IV antibiotics. 6/26; I put her in for a trial of hyperbaric oxygen only because of the dramatic response she has had with wounds on her left midfoot earlier this year which was a surgical wound that went straight to her bone over the metatarsal heads and also remotely the left third toe. We will see if we can get this through our review process and insurance. She arrives in clinic with again purulent material pouring out of necrotic bone on the top of the foot distally. There is also some concerning erythema on the front of the leg that we marked. It is bit difficult to tell how tender this is because of neuropathy. I  note from infectious disease that she had her vancomycin extended. 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 She had the wound VAC on for part of the week. The rest of the time she is putting various things on this including Medihoney, "ionized water" silver sorb gel etc. 7/7; follow-up along with HBO. She is still on vancomycin at dialysis. She has a large open area on the dorsal right foot and a small dark eschar area on her heel. There is a lot less erythema in the area and a lot less tenderness. From an infection point of view I think this is better. She still has a lot of necrosis in the remaining right forefoot [previous TMA] we are still using the wound VAC in this area 7/16; follow-up along with HBO. I put her on linezolid after she finished her vancomycin. We started this last Friday I gave her 2 weeks worth. I had the expectation that she would be operatively debrided by Dr. Marla Roe but that still has not happened yet. Patient phoned the  office this week. She arrives for review today after HBO. The distal part of this wound is completely necrotic. Nonviable pieces of tendon bone was still purulent drainage. Also concerning that she has black eschar over the heel that is expanding. I think this may be indicative of infection in this area as well. She has less erythema and warmth in the ankle and calf but still an abnormal exam 7/21 follow-up along with HBO. I will renew her linezolid after checking a CBC with differential monitoring her blood counts especially her platelets. She was supposed to have surgery yesterday but if I am reading things correctly this was canceled after her blood sugar was found to be over 500. I thought Dr. Marla Roe who called me said that they were sending her to the ER but the patient states that was not the case. 7/28. Follow-up along with HBO. She is on linezolid I still do not have any lab work from dialysis even though  I called last week. The patient is concerned about an area on her left lateral foot about the level of the base of her fifth metatarsal. I did not really see anything that ominous here however this patient is in [place] ability to point out problems that she is sensing and she has been accurate in the past Finally she received a call from Dr. Marla Roe who is referring her to another orthopedic surgeon stating that she is too booked up to take her to the operating room now. Was still using a wound VAC on the foot 8/3 -Follow-up after HBO, she is got another week of linezolid, she is to call ID for an appointment, x-rays of both feet were reviewed, the left foot x-ray with third MTP joint osteo- Right foot x-ray widespread osteo-in the right midfoot Right ankle x-ray does not show any active evidence of infection 8/11-Patient is seen after HBO, the wounds on the right foot appear to be about the same, the heel wound had some necrotic base over tendon that was debrided with a curette 8/21; patient is seen after HBO. The patient's wound on her dorsal foot actually looks reasonably good and there is substantial amount of epithelialization however the open area distally still has a lot of necrotic debris partially bone. I cannot really get a good sense of just how deep this probes under the foot. She has been pressuring me this week to order medical maggots through a company in [place] for her. The problem I have is there is not a defined wound area here. On the positive side there is no purulence. She has been to see infectious disease she is still on Septra DS although I have not had a chance to review their notes 8/28; patient is seen in conjunction with HBO. The wounds on her foot continued to improve including the right dorsal foot substantially the, the distal part of this wound and the area on the right heel. We have been using a wound VAC over this chronically. She is still on trimethoprim  as directed by infectious disease 9/4; patient is seen in conjunction with HBO. Right dorsal foot wound substantially anteriorly is better however she continues to have a deep wound in the distal part of this that is not responding. We have been using silver collagen under border foam Area on the right plantar medial heel seems better. We have been using Hydrofera Blue 12/12/18 on evaluation today patient appears to be doing about the same with regard to her wound based  on prior measurements. She does have some necrotic tissue noted on the lateral aspect of the wound that is going require a little bit of sharp debridement today. This includes what appears to be potentially either severely necrotic bone or tendon. Nonetheless other than that she does not appear to have any severe infection which is good news 9/18; it is been 2 weeks since I saw this wound. She is tolerating HBO well. Continued dramatic improvement in the area on the right dorsal foot. She still has a small wound on the heel that we have been using Hydrofera Blue. She continues with a wound VAC 9/24; patient has to be seen emergently today with a swelling on her right lateral lower leg. She says that she told Dr. Evette Doffing about this and also myself on a couple of occasions but I really have no recollection of this. She is not systemically unwell and her wound really looked good the last time I saw this. She showed this to providers at dialysis and she was able to verify that she was started on cephalexin today for 5 doses at dialysis. She dialyzes on Tuesday Thursday and Saturday. 10/2; patient is seen in conjunction with HBO. The area that is draining on the right anterior medial tibia is more extensive. Copious amounts of serosanguineous drainage with some purulence. We are still using the wound VAC on the original wound then it is stable. Culture I did of the original IandD showed MRSA I contacted dialysis she is now on vancomycin  with dialysis treatments. I asked them to run a month 10/9; patient seen in conjunction with HBO. She had a new spontaneous open area just above the wound on the right medial tibia ankle. More swelling on the right medial tibia. Her wound on the foot looks about the same perhaps slightly better. There is no warmth spreading up her leg but no obvious erythema. her MRI of the foot and ankle and distal tib-fib is not booked for next Friday I discussed this with her in great detail over multiple days. it is likely she has spreading infection upper leg at least involving the distal 25% above the ankle. She knows that if I refer her to orthopedics for infectious disease they are going to recommend amputation and indeed I am not against this myself. We had a good trial at trying to heal the foot which is what she wanted along with antibiotics debridement and HBO however she clearly has spreading infection [probably staph aureus/MRSA]. Nevertheless she once again tells me she wants to wait the left of the MRI. She still makes comments about having her amputation done in [place]. 10/19; arrives today with significant swelling on the lateral right leg. Last culture I did showed Klebsiella. Multidrug- resistant. Cipro was intermediate sensitivity and that is what I have her on pending her MRI which apparently is going to be done on Thursday this week although this seems to be moving back and forth. She is not systemically unwell. We are using silver alginate on her major wound area on the right medial foot and the draining areas on the right lateral lower leg Electronic Signature(s) Signed: 01/19/2019 5:54:03 PM By: Linton Ham MD Entered By: Linton Ham on 01/19/2019 15:47:35 -------------------------------------------------------------------------------- Physical Exam Details Patient Name: Date of Service: [name] 01/19/2019 1:15 PM Medical Record Number:[id number] Patient  Account Number: [id number] Date of Birth/Sex: Treating RN: [birth year] (48 y.o. Nancy Fetter Primary Care Provider: [name] Other Clinician: Referring Provider: Treating Provider/Extender:Zyanya Glaza,  [name], [name] in Treatment: 19 Constitutional Sitting or standing Blood Pressure is within target range for patient.. Pulse regular and within target range for patient.Marland Kitchen Respirations regular, non-labored and within target range.. Temperature is normal and within the target range for the patient.Marland Kitchen Appears in no distress. Notes Wound exam; the original wound is on the medial part of her remaining foot. This is on the right. This actually looks quite healthy. Medial heel also looks fairly good. Just lateral to the tibia distally is continued raised swelling with sanguinous some purulent discharge I once again cultured this Electronic Signature(s) Signed: 01/19/2019 5:54:03 PM By: Linton Ham MD Entered By: Linton Ham on 01/19/2019 15:51:09 -------------------------------------------------------------------------------- Physician Orders Details Patient Name: Date of Service: [name] 01/19/2019 1:15 PM Medical Record Number:[id number] Patient Account Number: [id number] Date of Birth/Sex: Treating RN: 07-07-1970 (48 y.o. Nancy Fetter Primary Care Provider: [name] Other Clinician: Referring Provider: Treating Provider/Extender:Sayed Apostol, [name], [name] in Treatment: 22 Verbal / Phone Orders: No Diagnosis Coding ICD-10 Coding Code Description M86.671 Other chronic osteomyelitis, right ankle and foot L97.514 Non-pressure chronic ulcer of other part of right foot with necrosis of bone E10.621 Type 1 diabetes mellitus with foot ulcer L02.415 Cutaneous abscess of right lower limb Follow-up Appointments Return Appointment in 1 week. Dressing Change Frequency Wound #43 Right,Medial Foot Change dressing three times week. Wound  #44 Right Calcaneus Change dressing three times week. Wound #46 Right,Lateral Lower Leg Change dressing three times week. Wound Cleansing Wound #43 Right,Medial Foot Clean wound with Wound Cleanser - anasept Wound #44 Right Calcaneus Clean wound with Wound Cleanser - anasept Wound #46 Right,Lateral Lower Leg Clean wound with Wound Cleanser - anasept Primary Wound Dressing Wound #43 Right,Medial Foot Calcium Alginate with Silver Other: - Hold wound vac this week Wound #44 Right Calcaneus Calcium Alginate with Silver Wound #46 Right,Lateral Lower Leg Calcium Alginate with Silver - do not pack, place over wound bed Secondary Dressing Wound #43 Right,Medial Foot Kerlix/Rolled [name] ABD pad Wound #44 Right Calcaneus Kerlix/Rolled [name] Dry [name] Heel Cup Wound #46 Right,Lateral Lower Leg Kerlix/Rolled [name] ABD pad Edema Control Elevate legs to the level of the heart or above for 30 minutes daily and/or when sitting, a frequency of: - throughout the day [place] skilled nursing for wound care. - Interim Laboratory Bacteria identified in Unspecified specimen by Anaerobe culture (MICRO) - Right lateral lower - (ICD10 L02.415 - Cutaneous abscess of right lower limb) LOINC Code: 585-2 Convenience Name: Anerobic culture Electronic Signature(s) Signed: 01/19/2019 5:54:03 PM By: Linton Ham MD Signed: 01/21/2019 6:50:57 PM By: [name] RN, BSN Entered By: [name] on 01/19/2019 14:35:29 -------------------------------------------------------------------------------- Problem List Details Patient Name: Date of Service: [name] 01/19/2019 1:15 PM Medical Record Number:[id number] Patient Account Number: [id number] Date of Birth/Sex: Treating RN: 31-Oct-1970 (48 y.o. Nancy Fetter Primary Care Provider: [name] Other Clinician: Referring Provider: Treating Provider/Extender:Kinsey Cowsert, [name], [name] in  Treatment: 19 Active Problems ICD-10 Evaluated Encounter Code Description Active Date Today Diagnosis M86.671 Other chronic osteomyelitis, right ankle and foot 09/03/2018 No Yes L97.514 Non-pressure chronic ulcer of other part of right foot 09/03/2018 No Yes with necrosis of bone E10.621 Type 1 diabetes mellitus with foot ulcer 09/24/2018 No Yes L02.415 Cutaneous abscess of right lower limb 12/25/2018 No Yes Inactive Problems Resolved Problems Electronic Signature(s) Signed: 01/19/2019 5:54:03 PM By: Linton Ham MD Entered By: Linton Ham on 01/19/2019 15:45:19 -------------------------------------------------------------------------------- Progress Note Details Patient Name: Date of Service:  Nancy, [name] 01/19/2019 1:15 PM Medical Record Number:[id number] Patient Account Number: [id number] Date of Birth/Sex: Treating RN: 03/08/1971 (48 y.o. Nancy Fetter Primary Care Provider: [name] Other Clinician: Referring Provider: Treating Provider/Extender:Graylyn Bunney, [name], [name] in Treatment: 19 Subjective History of Present Illness (HPI) 48 year old diabetic who is known to have type 1 diabetes which is poorly controlled last hemoglobin A1c was 11%. She comes in with a ulcerated area on the left lateral foot which has been there for over 6 months. Was recently she has been treated by [name] of podiatry who saw her last on 05/28/2016. Review of his notes revealed that the patient had incision and drainage with placement of antibiotic beads to the left foot on 04/11/2016 for possible osteomyelitis of the cuboid bone. Over the last year she's had a history of amputation of the left fifth toe and a femoropopliteal popliteal bypass graft somewhere in April 2017. 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 His note [name] mentions that the patient has been referred to me for further wound care and possibly great candidate for hyperbaric  oxygen therapy due to recurrent osteomyelitis. However we do not have any x-rays of biopsy reports confirming this. He has been on several antibiotics including Bactrim and most recently is on doxycycline for an MRSA. I understand, the patient was not a candidate for IV antibiotics as she has had previous PICC lines which resulted in blood clots in both arms. There was a x-ray report dated 04/04/2016 on [name] notes which showed evidence of fifth ray resection left foot with osteolytic changes noted to the fourth metatarsal and cuboid bone on the left. 06/13/2016 -- had a left foot x-ray which showed no acute fracture or dislocation and no definite radiographic evidence of osteomyelitis. Advanced osteopenia was seen. 06/20/2016 -- she has noticed a new wound on the right plantar foot in the region where she had a callus before. 06/27/16- the patient did have her x-ray of the right foot which showed no findings to suggest osteomyelitis. She saw her endocrinologist, [name], yesterday. Her A1c in January was 11. He also indicates mismanagement and noncompliance regarding her diabetes. She is currently on Bactrim for a lip infection. She is complaining of nausea, vomiting and diarrhea. She is unable to articulate the exact orders or dosing of the Bactrim; it is unclear when she will complete this. 07/04/2016 -- results from [facility] of ABIs with ankle waveforms were noted from 02/14/2016. The examination done on 06/27/2015 showed noncompressible ABIs with the right being 1.45 and the left being 1.33. The present examination showed a right ABI of 1.19 on the left of 1.33. The conclusion was that right normal ABI in the lower extremity at rest however compared to previous study which was noncompressible ABI may be falsely elevated side suggesting medial calcification. The left ABI suggested medial calcification. 08/01/2016 -- the patient had more redness and pain on her right foot and did not  get to come to see as noted she see her PCP or go to the ER and decided to take some leftover metronidazole which she had at home. As usual, the patient does report she feels and is rather noncompliant. 08/08/2016 -- -- x-ray of the right foot -- FINDINGS:Transmetatarsal amputation is noted. No bony destruction is noted to suggest osteomyelitis. IMPRESSION: No evidence of osteomyelitis. Postsurgical changes are seen. MRI would be more sensitive for possible bony changes. Culture has grown Serratia Marcescens -- sensitive to Bactrim, ciprofloxacin,  ceftazidime she was seen by [name] on 08/06/2016. He did not find any exposed bone, muscle, tendon, ligament or joint. There was no malodor and he did a excisional debridement in the office. ============ Old notes: 48 year old patient who is known to the wound clinic for a while had been away from the wound clinic since 09/01/2014. Over the last several months she has been admitted to various hospitals including [facility]. She was treated for a right metatarsal osteomyelitis with a transmetatarsal amputation and this was done about 2 months ago. He has a small ulcerated area on the right heel and she continues to have an ulcerated area on the left plantar aspect of the foot. The patient was recently admitted to the [facility] hospital group between 7/12 and 10/18/2014. she was given 3 weeks of IV vancomycin and was to follow-up with her surgeons at [facility] and also took oral vancomycin for C. difficile colitis. 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 MRI of the left foot done on 10/14/2014 showed no abscess or osteomyelitis. 04/27/15; this  is a patient we know from previous stays in the wound care center. She is a type I diabetic I am not sure of her control currently. Since the last time I saw her she is had a right transmetatarsal amputation and has no wounds on her right foot and has no open wounds. She is been followed at the wound care center at [facility] in [place]. She comes today with the desire to undergo hyperbaric treatment locally. Apparently one of her wound care providers in [place] has suggested hyperbarics. This is in response to an MRI from 04/18/15 that showed increased marrow signal and loss of the proximal fifth metatarsal cortex evidence of osteomyelitis with likely early osteomyelitis in the cuboid bone as well. She has a large wound over the base of the fifth metatarsal. She also has a eschar over her the tips of her toes on 1,3 and 5. She does not have peripheral pulses and apparently is going for an angiogram tomorrow which seems reasonable. After this she is going to infectious disease at [facility]. They have been using Medihoney to the large wound on the lateral aspect of the left foot to. The patient has known Charcot deformity from diabetic neuropathy. She also has known diabetic PAD. Surprisingly I can't see that she has had any recent antibiotics, the patient states the last antibiotic she had was at the end of November for 10 days. I think this was in response to culture that showed group G strep although I'm not exactly sure where the culture was from. She is also had arterial studies on 03/29/15. This showed a right ABI of 1.4 that was noncompressible. Her left ABI was 0.73. 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 It was not felt that arterial inflow was adequate for healing of a foot ulcer. Her Doppler waveforms looked monophasic ===== READMISSION 02/28/17; this is in an now 48 year old woman we've had at several different occasions in this clinic. She is a type  I diabetic with peripheral neuropathy Charcot deformity and known PAD. She has a remote ex-smoker. She was last seen in this clinic by Dr. Con Memos I think in May. More recently she is been followed  by her podiatrist [name] an infectious disease Dr. Megan Salon. She has 2 open wounds the major one is over the right first metatarsal head she also has a wound on the left plantar foot. an MRI of the right foot on 01/01/17 showed a soft tissue ulcer along the plantar aspect of the first metatarsal base consistent with osteomyelitis of the first metatarsal stump. Dr. 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 According to the patient this is been open for slightly over a month. She has been on a combination of Cipro 500 twice a day, Zyvox 600 twice a day and Flagyl 500 3 times a day for over a month now as directed by Dr. Megan Salon. cultures of the right foot earlier this year showed MRSA in January and Serratia in May. January also had a few viridans strep. Recent x-rays of both feet were done and [name] office and I don't have these reports. The patient has known PAD and has a history of aleft femoropopliteal bypass in April 2017. 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 Dwyane Dee. In spite of this the last hemoglobin A1c I can see is 10.1 on 01/01/2017. She is being referred by 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 She is been using Medihoney to this area and also an area on the left midfoot. She is using healing sandals bilaterally. ABIs in this clinic at the left posterior tibial was 1.1 noncompressible on the right READMISSION Non invasive vascular NOVANT 5/18 Aftercare following  surgery of the circulatory system Procedure Note - Interface, External Ris In - 08/13/2016 11:05 AM EDT Procedure: Examination consists of physiologic resting arterial pressures of the brachial and ankle arteries bilaterally with continuous wave Doppler waveform analysis. Previous: Previous exam performed on 02/14/16 demonstrated ABIs of Rt = 1.19 and Lt = 1.33. Right: ABI = non-compressible PT, 1.47 DP. S/P transmet amputation. Left: ABI = 1.52, 2nd digit pressure = 87 mmHg Conclusions: Right: ABI (>1.3) may be falsely elevated, suggesting medial calcification. Left: ABI (>1.3) may be falsely elevated, suggesting medial calcification The patient is a now 48 year old type I diabetic is had multiple issues her graded to chronic diabetic foot ulcers. 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 We had her in the clinic lastin November. At that point she had wounds on her bilateral feet.she had wanted to try hyperbarics however the healogics review process denied her because she hadn't followed up with her vascular surgeon for her left femoropopliteal bypass. The bypass was done by Dr. Raul Del at [facility]. We made her a follow-up with Dr. Raul Del however she did not keep the appointment and therefore she was not approved The patient shows me a small wound on her left fourth metatarsal head on her phone. She developed rapid discoloration in the plantar aspect of the left foot and she was admitted to hospital from 2/2 through 05/10/17 with wet gangrene of the left foot osteomyelitis of the fourth metatarsal heads. She was admitted acutely ill with a temperature of 103. She was started on broad-spectrum vancomycin and cefepime. On 05/06/17 she was taken to the OR by [name] her podiatric surgeon for an incision  and drainage irrigation of the left foot wound. Cultures from this surgery revealed group be strep and  anaerobes. she was seen by Dr.Xu of orthopedic surgery and scheduled for a below-knee amputation which she u refused. Ultimately she was discharged on Levaquin and Flagyl for one month. MRI 05/05/17 done while she was in the hospital showed abscess adjacent to the fourth metatarsal head and neck small abscess around the fourth flexor tendon. Inflammatory phlegmon and gas in the soft tissues along the lateral aspect of the fourth phalanx. Findings worrisome for osteomyelitis involving the fourth proximal and middle phalanx and also the third and fourth metatarsals. Finally the patient had actually shortly before this followed up with Dr. Raul Del at no time on 04/29/17. He felt that her left femoropopliteal bypass was patent he felt that her left-sided toe pressures more than adequate for healing a wound on the left foot. This was before her acute presentation. 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 05/28/17; she is started hyperbarics. The patient tells me that for some reason she was not actually on Levaquin but I think on ciprofloxacin. She was on Flagyl. She only started her Levaquin yesterday due to some difficulty with the pharmacy and perhaps her sister picking it up. She has an appointment with [name] tomorrow and with infectious disease early next week. She has no new complaints 06/06/17; the patient continues in hyperbarics. She saw [name] on 05/29/17 who is her podiatric surgeon. He is elected for a transmetatarsal amputation on 06/27/17. I'm not sure at what level he plans to do this amputation. The patient is unaware ooShe also saw Dr. Megan Salon of infectious disease who elected to continue her on current antibiotics I think this is ciprofloxacin and Flagyl. I'll need to clarify with her tomorrow if she actually has this. We're using silver alginate to the actual wound. Necrotic surface today with material under the flap of her foot. ooOriginal MRI showed abscesses as well as  osteomyelitis of the proximal and middle fourth phalanx and the third and fourth metatarsal heads 06/11/17; patient continues in hyperbarics and continues on oral antibiotics. She is doing well. The wound looks better. The necrotic part of this under the flap in her superior foot also looks better. she is been to see [name]. I haven't had a chance to look at his note. Apparently he has put the transmetatarsal amputation on hold her request it is still planning to take her to the OR for debridement and product application ACEL. I'll see if I can find his note. I'll therefore leave product ordering/requests to [name] for now. I was going to look at Dermagraft 06/18/17-she is here in follow-up evaluation for bilateral foot wounds. She continues with hyperbaric therapy. She states she has been applying manuka honey to the right plantar foot and alternate manuka honey and silver alginate to the left foot, despite our orders. We will continue with same treatment plan and she will follo up next week. 06/25/17; I have reviewed [name] last note from 3/11. She has operative debridement in 2 days' time. By review his note apparently they're going to place there is skin over the majority of this wound which is a good choice. She has a small satellite area at the most proximal part of this wound on the left plantar foot. The area on the right plantar foot we've been using silver alginate and it is close to healing. 07/02/17; unfortunately the patient was not easily approved for [name] proposed  surgery. I'm not completely certain what the issue is. She has been using silver alginate to the wound she has completed a first course of hyperbarics. She is still on Levaquin and Flagyl. I have really lost track of the time course here.I suspect she should have another week to 2 of antibiotics. I'll need to see if she is followed up with infectious disease Dr. Megan Salon 07/09/17; the patient is followed up with Dr.  Megan Salon. 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 She continues on Levaquin and metronidazole continuing both of these for now I think she is been on fr about 6 weeks. She still has some drainage but no pain. No fever. Her had been plans for her to go to the OR for operative debridement with her podiatrist [name], I am not exactly sure where that is. I'll probably slip a note to [name] today. I note that she follows with Dr. Dwyane Dee of endocrinology. We have her recertified for hyperbaric oxygen. I have not heard about Dermagraft however I'll see if [name] is planning a skin substitute as well 07/16/17; the patient tells me she is just about out of Plain. I'll need to check Dr. Hale Bogus last notes on this. She states she has plenty of Flagyl however. She comes in today complaining of pain in the right lateral foot which she said lasted for about a day. The wound on the right foot is actually much more medially. She also tells me that the [facility] cost a lot of pain in the left foot wound and she turned back to silver alginate. Finally Dermagraft has a $902 per application co-pay. She cannot afford this 07/23/17; patient arrives today with the wound not much smaller. There is not much new to add. She has not heard from [name] all try to put in a call to them today. She was asking about Dermagraft again and she has an over $409 per application co-pay she states that she would be willing to try to do a payment plan. I been tried to avoid this. We've been using silver alginate, I'll change to [facility] 07/30/17-She is here in follow-up evaluation for left foot ulcer. She continues hyperbaric medicine. The left foot ulcer is stable we will continue with same treatment plan 08/06/17; she is here for evaluation of her left foot ulcer. Currently being treated for hyperbarics or underlying osteomyelitis. She is completed antibiotics. The  left foot ulcer is better smaller with healthier looking granulation. For various reasons I am not really clear on we never got her back to the OR with [name]. He did not respond to my secure text message. Nevertheless I think that surgery on this point is not necessary nor am I completely clear that a skin substitute is necessary The patient is complaining about pain on the outside of her right foot. 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 There is no erythema. She also states the foot is warm versus her other part of her upper leg and this is largely true. It is not totally clear to me what's causing this. She thinks it's different from her usual neuropathy pain 08/13/17; she arrives in clinic today with a small wound which is superficial on her right first metatarsal head. 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 She tells [place] she was up on her feet over the Mother's Day celebration. ooThe large wound is on the left foot.  Continues with hyperbarics for underlying osteomyelitis. We're using Hydrofera Blue. She asked me today about where we were with Dermagraft. I had actually excluded this because of the co- pay however she wants to assume this therefore I'll recheck the co-pay an order for next week. 08/20/17; the patient agreed to accept the co-pay of the first Apligraf which we applied today. She is disappointed she is finishing hyperbarics will run this through the insurance on the extent of the foot infection and the extent of the wound that she had however she is already had 60 dive's. Dermagraft No. 1 08/27/17; Dermagraft No. 2. She is not eligible for any more hyperbaric treatments this month. She reports a fair amount of drainage and she actually changed to the external dressings without disturbing the direct contact layer 09/03/17; the patient arrived in clinic today with the wound superficially looking quite healthy. Nice vibrant red tissue with some  advancing epithelialization although not as much adherence of the flap as I might like. However she noted on her own fourth toe some bogginess and she brought that to our attention. Indeed this was boggy feeling like a possibility of subcutaneous fluid. 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 She is not been unwell. We've been using Dermagraft 09/10/17; the culture that I did not last week was MRSA. She saw Dr. Megan Salon this morning who is going to start her on vancomycin. I had sent him a secure a text message yesterday. I also spoke with her podiatric surgeon [name] about surgery on this foot the options for conserving a functional foot etc. Promised me he would see her and will make back consultation today. Paradoxically her actual wound on the plantar aspect of her left foot looks really quite good. I had given her 5 days worth of Baxdella to cover her for MRSA. Her MRI came back showing osteomyelitis within the third metatarsal shaft and head and base of the third and fourth proximal phalanx. She had extensive inflammatory changes throughout the soft tissue of the lateral forefoot. With an ill-defined fluid around the fourth metatarsal extending into the plantar and dorsal soft tissues 09/19/17; the patient is actually on oral Septra and Flagyl. She apparently refused IV vancomycin. She also saw [name] at my request who is planning her for a left BKA sometime in mid July. MRI showed osteomyelitis within the third metatarsal shaft and head and the basis of the third and fourth proximal phalanx. I believe there was felt to be possible septic arthritis involving the third MTP. 09/26/17; the patient went back to Dr. Megan Salon at my suggestion and is now receiving IV daptomycin. Her wound continues to look quite good making the decision to proceed with a transmetatarsal amputation although more difficult for the patient. I  believe in my extensive discussions with her she has a good sense of the pros and cons of this. I don't NV the tuft decision she has to make. She has an appointment with [name] I believe in mid July and I previously spoken to him about this issue Has we had used 3 previous Dermagraft. Given the condition of the wound surface I went ahead and added the fourth one today area and I did this not fully realizing that she'll be traveling to [place] next week. I'm hopeful she can come back in 2 weeks 10/21/17; Her same Dermagraft on for about 3-1/2 weeks. In  spite of this the wound arrives looking quite healthy. There is been a lot of healing dimensions are smaller. Looking at the square shaped wound she has now there is some undermining and some depth medially under the undermining although I cannot palpate any bone. No surrounding infection is obvious. She has difficult questions about how to look at this going forward vis--vis amputations versus continued medical therapy. To be truthful the wound is looks so healthy and it is continued to contract. Hard to justify foot surgery at this point although I still told her that I think it might come to that if we are not able to eradicate the underlying MRSA. She is still highly at risk and she understands this 11/06/17 on evaluation today patient appears to be doing better in regard to her foot ulcer. She's been tolerating the dressing changes without complication. Currently she is here for her Dermagraft #6. Her wound continues to make excellent progress at this point. She does not appear to have any evidence of infection which is good news. 11/13/17 on evaluation today patient appears to be doing excellent at this time. She is here for repeat Dermagraft application. This is #7. Overall her wound seems to be making great progress. 12/05/17; the patient arrives with the wound in much better condition than when I last saw this almost 6 weeks ago. She still  has a small probing area in the left metatarsal head region on the lateral aspect of her foot. We applied her last Dermagraft today. ooSince the last time she is here she has what appears to a been a blood blister on the plantar aspect of left foot although I don't see this is threatening. There is also a thick raised tissue on the right mid metatarsal head region. This was not there I don't think the last time she was here 3 weeks ago. 12/12/17; the patient continues to have a small programming area in the left metatarsal head region on the lateral aspect of her foot which was the initial large surgical wound. I applied her last Apligraf last week. I'm going to use Endoform starting today ooUnfortunately she has an excoriated area in the left mid foot and the right mid foot. The left midfoot looks like a blistered area this was not opened last week it certainly is open today. Using silver alginate on these areas. She promises me she is offloading this. 12/19/17; the small probing area in the left metatarsal head eyes think is shallower. In general her original wound looks better. We've been using Endoform. The area inferiorly that I think was trauma last week still requires debridement a lot of nonviable surface which I removed. She still has an open open area distally in her foot ooSimilarly on the right foot there is tightly adherent surface debris which I removed. Still areas that don't look completely epithelialized. This is a small open area. We used silver alginate on these areas 12/26/2017; the patient did not have the supplies we ordered from last week including the Endoform. The original large wound on the left lateral foot looks healthy. She still has the undermining area that is largely unchanged from last week. She has the same heavily callused raised edged wounds on the right mid and left midfoot. Both of these requiring debridement. We have been using silver alginate on these  areas 01/02/2018; there is still supply issues. We are going to try to use Prisma but I am not sure she actually got it from what  she is saying. She has a new open area on the lateral aspect of the left fourth toe [previous fifth ray amputation]. Still the one tunneling area over the fourth metatarsal head. The area is in the midfoot bilaterally still have thick callus around them. She is concerned about a raised swelling on the lateral aspect of the foot. However she is completely insensate 01/10/2018; we are using Prisma to the wounds on her bilateral feet. Surprisingly the tunneling area over the left fourth metatarsal head that was part of her original surgery has closed down. She has a small open area remaining on the incision line. 2 open areas in the midfoot. 02/10/2018; the patient arrives back in clinic after a month hiatus. She was traveling to visit family in [place]. Is fairly clear she was not offloading the areas on her feet. The original wound over the left lateral foot at the level of metatarsal heads is reopened and probes medially by about a centimeter or 2. She notes that a week ago she had purulent drainage come out of an area on the left midfoot. Paradoxically the worst area is actually on the right foot is extensive with purulent drainage. We will use silver alginate today 02/17/2018; the patient has 3 wounds one over the left lateral foot. She still has a small area over the metatarsal heads which is the remnant of her original surgical wound. This has medial probing depth of roughly 1.4 cm somewhat better than last week. The area on the right foot is larger. We have been using silver alginate to all areas. The area on the right foot and left foot that we cultured last week showed both Klebsiella and Proteus. Both of these are quinolone sensitive. The patient put her's self on Bactrim and Flagyl that she had left hanging around from prior antibiotic usages. She was  apparently on this last week when she arrived. I did not realize this. Unfortunately the Bactrim will not cover either 1 of these organisms. We will send in Cipro 500 twice daily for a week 03/04/2018; the patient has 2 wounds on the left foot one is the original wound which was a surgical wound for a deep DFU. At one point this had exposed bone. She still has an area over the fourth metatarsal head that probes about 1.4 cm although I think this is better than last week. I been using silver nitrate to try and promote tissue adherence and been using silver alginate here. ooShe also has an area in the left midfoot. This has some depth but a small linear wound. Still requiring debridement. ooOn the right midfoot is a circular wound. A lot of thick callus around this area. ooWe have been using silver alginate to all wound areas ooShe is completed the ciprofloxacin I gave her 2 weeks ago. 03/11/2018; the patient continues to have 2 open areas on the left foot 1 of which was the original surgical wound for a deep DFU. Only a small probing area remains although this is not much different from last week we have been using silver alginate. The other area is on the midfoot this is smaller linear but still with some depth. We have been using silver alginate here as well ooOn the right foot she has a small circular wound in the mid aspect. This is not much smaller than last time. We have been using silver alginate here as well 03/18/2018; she has 3 wounds on the left foot the original surgical wound, a very  superficial wound in the mid aspect and then finally the area in the mid plantar foot. She arrives in today with a very concerning area in the wound in the mid plantar foot which is her most proximal wound. There is undermining here of roughly 1-1/2 cm superiorly. Serosanguineous drainage. She tells me she had some pain on for over the weekend that shot up her foot into her thigh and she tells me that  she had a nodule in the groin area. ooShe has the single wound in the right foot. ooWe are using endoform to both wound areas 03/24/2018; the patient arrives with the original surgical wound in the area on the left midfoot about the same as last week. There is a collection of fluid under the surface of the skin extending from the surgical wound towards the midfoot although it does not reach the midfoot wound. The area on the right foot is about the same. Cultures from last week of the left midfoot wound showed abundant Klebsiella abundant Enterococcus faecalis and moderate methicillin resistant staph I gave her Levaquin but this would have only covered the Klebsiella. She will need linezolid 04/01/2018; she is taking linezolid but for the first few days only took 1 a day. I have advised her to finish this at twice daily dosing. In any case all of her wounds are a lot better especially on the left foot. The original surgical wound is closed. The area on the left midfoot considerably smaller. The area on the right foot also smaller. 04/08/2018; her original surgical wound/osteomyelitis on the left foot remains closed. She has area on the left foot that is in the midfoot area but she had some streaking towards this. This is not connected with her original wound at least not visually. ooSmall wound on the right midfoot appears somewhat smaller. 04/15/18; both wounds looks better. Original wound is better left midfoot. Using silver alginate 1/21; patient states she uses saltwater soak in, stones or remove callus from around her wounds. She is also concerned about a blood blister she had on the left foot but it simply resolved on its own. We've been using silver alginate 1/28; the patient arrives today with the same streaking area from her metatarsals laterally [the site of her original surgical wound] down to the middle of her foot. There is some drainage in the subcutaneous area here. This concerns  me that there is actually continued ongoing infection in the metatarsals probably the fourth and third. This fixates an MRI of the foot without contrast [chronic renal failure] ooThe wound in the mid part of the foot is small but I wonder whether this area actually connects with the more distal foot. ooThe area on the right midfoot is probably about the same. Callus thick skin around the small wound which I removed with a curette we have been using silver alginate on both wound areas 2/4; culture I did of the draining site on the left foot last time grew methicillin sensitive staph aureus. MRI of the left foot showed interval resolution of the findings surrounding the third metatarsal joint on the prior study consistent with treated osteomyelitis. Chronic soft tissue ulceration in the plantar and lateral aspect of the forefoot without residual focal fluid collection. No evidence of recurrent osteomyelitis. Noted to have the previous amputation of the distal first phalanx and fifth ray MRI of the right foot showed no evidence of osteomyelitis I am going to treat the patient with a prolonged course of antibiotics directed  against MSSA in the left foot 2/11; patient continues on cephalexin. She tells me she had nausea and vomiting over the weekend and missed 2 days. In general her foot looks much the same. She has a small open area just below the left fourth metatarsal head. A linear area in the left midfoot. Some discoloration extending from the inferior part of this into the left lateral foot although this appears to be superficial. She has a small area on the right midfoot which generally looks smaller after debridement 2/18; the patient is completing his cephalexin and has another 2 days. She continues to have open areas on the left and right foot. 2/25; she is now off antibiotics. The area on the left foot at the site of her original surgical wound has closed yet again. She still has open  areas in the mid part of her foot however these appear smaller. The area on the right mid foot looks about the same. We have been using silver alginate She tells me she had a serious hypoglycemic spell at home. She had to have EMS called and get IV dextrose 3/3; disappointing on the left lateral foot large area of necrotic tissue surrounding the linear area. This appears to track up towards the same original surgical wound. Required extensive debridement. The area on the right plantar foot is not a lot better also using silver 3/12; the culture I did last time showed abundant enterococcus. I have prescribed Augmentin, should cover any unrecognized anaerobes as well. In addition there were a few MRSA and Serratia that would not be well covered although I did not want to give her multiple antibiotics. She comes in today with a new wound in the right midfoot this is not connected with the original wound over her MTP a lot of thick callus tissue around both wounds but once again she said she is not walking on these areas 3/17-Patient comes in for follow-up on the bilateral plantar wounds, the right midfoot and the left plantar wound. Both these are heavily callused surrounding the wounds. We are continuing to use silver alginate, she is compliant with offloading and states she uses a wheelchair fairly often at home 3/24; both wound areas have thick callus. However things actually look quite a bit better here for the majority of her left foot and the right foot. 3/31; patient continues to have thick callused somewhat irritated looking tissue around the wounds which individually are fairly superficial. There is no evidence of surrounding infection. We have been using silver alginate however I change that to [facility] today 4/17; patient returns to clinic after having a scare with Covid she tested negative in her primary doctor's office. She has been using Hydrofera Blue. She does not have an open  area on the right foot. On the left foot she has a small open area with the mid area not completely viable. She showed me pictures of what looks like a hemorrhagic blister from several days ago but that seems to have healed over this was on the lateral left foot 4/21; patient comes in to clinic with both her wounds on her feet closed. However over the weekend she started having pain in her right foot and leg up into the thigh. She felt as though she was running a low-grade fever but did not take her temperature. She took a doxycycline that she had leftover and yesterday a single Septra and metronidazole. She thinks things feel somewhat better. 4/28; duplex ultrasound I ordered last  week was negative for DVT or superficial thrombophlebitis. She is completed the doxycycline I gave her. States she is still having a lot of pain in the right calf and right ankle which is no better than last week. She cannot sleep. She also states she has a temperature of up to 101, coughing and complaining of visual loss in her bilateral eyes. Apparently she was tested for Covid 2 weeks ago at [facility] and that was negative. Readmission: 09/03/18 patient presents back for reevaluation after having been evaluated at the end of April regarding erythema and swelling of her right lower extremity. Subsequently she ended up going to the hospital on 07/29/18 and was admitted not to be discharged until 08/08/18. Unfortunately it was noted during the time that she was in the hospital that she did have methicillin-resistant Staphylococcus aureus as the infection noted at the site. It was also determined that she did have osteomyelitis which appears to be fairly significant. She was treated with vancomycin and in fact is still on IV vancomycin at dialysis currently. This is actually slated to continue until 09/12/18 at least which will be the completion of the six weeks of therapy. Nonetheless based on what I'm seeing at this point I'm  not sure she will be anywhere near ready to discontinue antibiotics at that time. Since she was released from the hospital she was seen by [name] who is her podiatrist on 08/27/18. His note specifically states that he is recommended that the patient needs of one knee amputation on the right as she has a life-threatening situation that can lead quickly to sepsis. The patient advised she would like to try to save her leg to which [name] apparently told her that this was against all medical advice. She also want to discontinue the Wound VAC which had been initiated due to the fact that she wasn't pleased with how the wound was looking and subsequently she wanted to pursue applying Medihoney at that time. He stated that he did not believe that the right lower extremity was salvageable and that the patient understood but would still like to attempt hyperbaric option therapy if it could be of any benefit. She was therefore referred back to [place] for further evaluation. He plans to see her back next week. Upon inspection today patient has a significant amount purulent drainage noted from the wound at this point. The bone in the distal portion of her foot also appears to be extremely necrotic and spongy. When I push down on the bone it bubbles and seeps purulent drainage from deeper in the end of the foot. I do not think that this is likely going to heal very well at all and less aggressive surgical debridement were undertaken more than what I believe we can likely do here in our office. 09/12/2018; I have not seen this patient since the most recent hospitalization although she was in our clinic last week. I have reviewed some of her records from a complex hospitalization. 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 There is situation was complicated by MRSA bacteremia and acute on chronic renal failure now on dialysis. She is receiving vancomycin at dialysis.  We started her on Dakin's wet-to- dry last week she is changing this daily. There is still purulent drainage coming out of her foot. Although she is apparently "agreeable" to a below-knee amputation which is been suggested by multiple clinicians she wants this to be done in [place].  She apparently has a telehealth visit with that provider sometime in late [place] 6/24. I have told her I think this is probably too long. Nevertheless I could not convince her to allow a local doctor to perform BKA. 09/19/2018; the patient has a large necrotic area on the right anterior foot. She has had previous transmetatarsal amputations. Culture I did last week showed MRSA nothing else she is on vancomycin at dialysis. She has continued leaking purulent drainage out of the distal part of the large circular wound on the right anterior foot. She apparently went to see Dr. 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 Somehow that translated into her being referred to plastic surgery for debridement of the area. I gather she basically refused amputation although I do not have a copy of Dr. Berenice Primas notes. The patient really wants to have a trial of hyperbaric oxygen. I agreed with initial assessment in this clinic that this was probably too far along to benefit however if she is going to have plastic surgery I think she would benefit from ancillary hyperbaric oxygen. The issue here is that the patient has benefited as maximally as any patient I have ever seen from hyperbaric oxygen therapy. Most recently she had exposed bone on the lateral part of her left foot after a surgical procedure and that actually has closed. She has eschared areas in both heels but no open area. She is remained systemically well. I am not optimistic that anything can be done about this but the patient is very clear that she wants an attempt. The attempt would include a wound VAC further debridements and hyperbaric  oxygen along with IV antibiotics. 6/26; I put her in for a trial of hyperbaric oxygen only because of the dramatic response she has had with wounds on her left midfoot earlier this year which was a surgical wound that went straight to her bone over the metatarsal heads and also remotely the left third toe. We will see if we can get this through our review process and insurance. She arrives in clinic with again purulent material pouring out of necrotic bone on the top of the foot distally. There is also some concerning erythema on the front of the leg that we marked. It is bit difficult to tell how tender this is because of neuropathy. I note from infectious disease that she had her vancomycin extended. 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 She had the wound VAC on for part of the week. The rest of the time she is putting various things on this including Medihoney, "ionized water" silver sorb gel etc. 7/7; follow-up along with HBO. She is still on vancomycin at dialysis. She has a large open area on the dorsal right foot and a small dark eschar area on her heel. There is a lot less erythema in the area and a lot less tenderness. From an infection point of view I think this is better. She still has a lot of necrosis in the remaining right forefoot [previous TMA] we are still using the wound VAC in this area 7/16; follow-up along with HBO. I put her on linezolid after she finished her vancomycin. We started this last Friday I gave her 2 weeks worth. I had the expectation that she would be operatively debrided by Dr. Marla Roe but that still has not happened yet. Patient phoned the office this week. She arrives for review today after HBO. The distal part of this  wound is completely necrotic. Nonviable pieces of tendon bone was still purulent drainage. Also concerning that she has black eschar over the heel that is expanding. I think this may be indicative of  infection in this area as well. She has less erythema and warmth in the ankle and calf but still an abnormal exam 7/21 follow-up along with HBO. I will renew her linezolid after checking a CBC with differential monitoring her blood counts especially her platelets. She was supposed to have surgery yesterday but if I am reading things correctly this was canceled after her blood sugar was found to be over 500. I thought Dr. Marla Roe who called me said that they were sending her to the ER but the patient states that was not the case. 7/28. Follow-up along with HBO. She is on linezolid I still do not have any lab work from dialysis even though I called last week. The patient is concerned about an area on her left lateral foot about the level of the base of her fifth metatarsal. I did not really see anything that ominous here however this patient is in [place] ability to point out problems that she is sensing and she has been accurate in the past Finally she received a call from Dr. Marla Roe who is referring her to another orthopedic surgeon stating that she is too booked up to take her to the operating room now. Was still using a wound VAC on the foot 8/3 -Follow-up after HBO, she is got another week of linezolid, she is to call ID for an appointment, x-rays of both feet were reviewed, the left foot x-ray with third MTP joint osteo- Right foot x-ray widespread osteo-in the right midfoot Right ankle x-ray does not show any active evidence of infection 8/11-Patient is seen after HBO, the wounds on the right foot appear to be about the same, the heel wound had some necrotic base over tendon that was debrided with a curette 8/21; patient is seen after HBO. The patient's wound on her dorsal foot actually looks reasonably good and there is substantial amount of epithelialization however the open area distally still has a lot of necrotic debris partially bone. I cannot really get a good sense of just how  deep this probes under the foot. She has been pressuring me this week to order medical maggots through a company in [place] for her. The problem I have is there is not a defined wound area here. On the positive side there is no purulence. She has been to see infectious disease she is still on Septra DS although I have not had a chance to review their notes 8/28; patient is seen in conjunction with HBO. The wounds on her foot continued to improve including the right dorsal foot substantially the, the distal part of this wound and the area on the right heel. We have been using a wound VAC over this chronically. She is still on trimethoprim as directed by infectious disease 9/4; patient is seen in conjunction with HBO. Right dorsal foot wound substantially anteriorly is better however she continues to have a deep wound in the distal part of this that is not responding. We have been using silver collagen under border foam ooArea on the right plantar medial heel seems better. We have been using Hydrofera Blue 12/12/18 on evaluation today patient appears to be doing about the same with regard to her wound based on prior measurements. She does have some necrotic tissue noted on the lateral aspect of  the wound that is going require a little bit of sharp debridement today. This includes what appears to be potentially either severely necrotic bone or tendon. Nonetheless other than that she does not appear to have any severe infection which is good news 9/18; it is been 2 weeks since I saw this wound. She is tolerating HBO well. Continued dramatic improvement in the area on the right dorsal foot. She still has a small wound on the heel that we have been using Hydrofera Blue. She continues with a wound VAC 9/24; patient has to be seen emergently today with a swelling on her right lateral lower leg. She says that she told Dr. Evette Doffing about this and also myself on a couple of occasions but I really have no  recollection of this. She is not systemically unwell and her wound really looked good the last time I saw this. She showed this to providers at dialysis and she was able to verify that she was started on cephalexin today for 5 doses at dialysis. She dialyzes on Tuesday Thursday and Saturday. 10/2; patient is seen in conjunction with HBO. The area that is draining on the right anterior medial tibia is more extensive. Copious amounts of serosanguineous drainage with some purulence. We are still using the wound VAC on the original wound then it is stable. Culture I did of the original IandD showed MRSA I contacted dialysis she is now on vancomycin with dialysis treatments. I asked them to run a month 10/9; patient seen in conjunction with HBO. She had a new spontaneous open area just above the wound on the right medial tibia ankle. More swelling on the right medial tibia. Her wound on the foot looks about the same perhaps slightly better. There is no warmth spreading up her leg but no obvious erythema. her MRI of the foot and ankle and distal tib-fib is not booked for next Friday I discussed this with her in great detail over multiple days. it is likely she has spreading infection upper leg at least involving the distal 25% above the ankle. She knows that if I refer her to orthopedics for infectious disease they are going to recommend amputation and indeed I am not against this myself. We had a good trial at trying to heal the foot which is what she wanted along with antibiotics debridement and HBO however she clearly has spreading infection [probably staph aureus/MRSA]. Nevertheless she once again tells me she wants to wait the left of the MRI. She still makes comments about having her amputation done in [place]. 10/19; arrives today with significant swelling on the lateral right leg. Last culture I did showed Klebsiella. Multidrug- resistant. Cipro was intermediate sensitivity and that is what  I have her on pending her MRI which apparently is going to be done on Thursday this week although this seems to be moving back and forth. She is not systemically unwell. We are using silver alginate on her major wound area on the right medial foot and the draining areas on the right lateral lower leg Objective Constitutional Sitting or standing Blood Pressure is within target range for patient.. Pulse regular and within target range for patient.Marland Kitchen Respirations regular, non-labored and within target range.. Temperature is normal and within the target range for the patient.Marland Kitchen Appears in no distress. Vitals Time Taken: 1:48 PM, Height: 67 in, Weight: 125 lbs, BMI: 19.6, Temperature: 98.7 F, Pulse: 93 bpm, Respiratory Rate: 16 breaths/min, Blood Pressure: 139/73 mmHg, Capillary Blood Glucose: 350 mg/dl. General  Notes: Wound exam; the original wound is on the medial part of her remaining foot. This is on the right. This actually looks quite healthy. Medial heel also looks fairly good. Just lateral to the tibia distally is continued raised swelling with sanguinous some purulent discharge I once again cultured this Integumentary (Hair, Skin) Wound #43 status is Open. Original cause of wound was Gradually Appeared. The wound is located on the Right,Medial Foot. The wound measures 7.2cm length x 3.5cm width x 2.4cm depth; 19.792cm^2 area and 47.501cm^3 volume. There is muscle, tendon, and Fat Layer (Subcutaneous Tissue) Exposed exposed. There is no tunneling or undermining noted. There is a medium amount of serosanguineous drainage noted. The wound margin is distinct with the outline attached to the wound base. There is large (67-100%) red, pink granulation within the wound bed. There is a small (1-33%) amount of necrotic tissue within the wound bed including Adherent Slough and Necrosis of Muscle. Wound #44 status is Open. Original cause of wound was Gradually Appeared. The wound is located on the  Right Calcaneus. The wound measures 0.6cm length x 1.2cm width x 0.1cm depth; 0.565cm^2 area and 0.057cm^3 volume. There is Fat Layer (Subcutaneous Tissue) Exposed exposed. There is no tunneling or undermining noted. There is a small amount of serosanguineous drainage noted. The wound margin is flat and intact. There is large (67- 100%) red granulation within the wound bed. There is no necrotic tissue within the wound bed. Wound #46 status is Open. Original cause of wound was Other Lesion. The wound is located on the Right,Lateral Lower Leg. The wound measures 0.5cm length x 1.2cm width x 0.2cm depth; 0.471cm^2 area and 0.094cm^3 volume. There is Fat Layer (Subcutaneous Tissue) Exposed exposed. There is no tunneling or undermining noted. There is a medium amount of serosanguineous drainage noted. The wound margin is distinct with the outline attached to the wound base. There is large (67-100%) red, pink granulation within the wound bed. There is a small (1-33%) amount of necrotic tissue within the wound bed. 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 Dressing Change Frequency: Wound #43 Right,Medial Foot: Change dressing three times week. Wound #44 Right Calcaneus: Change dressing three times week. Wound #46 Right,Lateral Lower Leg: Change dressing three times week. Wound Cleansing: Wound #43 Right,Medial Foot: Clean wound with Wound Cleanser - anasept Wound #44 Right Calcaneus: Clean wound with Wound Cleanser - anasept Wound #46 Right,Lateral Lower Leg: Clean wound with Wound Cleanser - anasept Primary Wound Dressing: Wound #43 Right,Medial Foot: Calcium Alginate with Silver Other: - Hold wound vac this week Wound #44 Right  Calcaneus: Calcium Alginate with Silver Wound #46 Right,Lateral Lower Leg: Calcium Alginate with Silver - do not pack, place over wound bed Secondary Dressing: Wound #43 Right,Medial Foot: Kerlix/Rolled [name] ABD pad Wound #44 Right Calcaneus: Kerlix/Rolled [name] Dry [name] Heel Cup Wound #46 Right,Lateral Lower Leg: Kerlix/Rolled [name] ABD pad Edema Control: Elevate legs to the level of the heart or above for 30 minutes daily and/or when sitting, a frequency of: - throughout the day Home Health: [facility] skilled nursing for wound care. - Interim Laboratory ordered were: Anerobic culture - Right lateral lower 1. Silver alginate to all areas 2. Before making any decisions on this I am still waiting for the MRI. 3.  I still think were dealing with a staph aureus abscess although the last culture showed a very drug-resistant Klebsiella Electronic Signature(s) Signed: 01/19/2019 5:54:03 PM By: Linton Ham MD Entered By: Linton Ham on 01/19/2019 15:52:05 -------------------------------------------------------------------------------- SuperBill Details Patient Name: Date of Service: [name] 01/19/2019 Medical Record Number:[id number] Patient Account Number: [id number] Date of Birth/Sex: Treating RN: [birth year] (48 y.o. Nancy Fetter Primary Care Provider: [name] Other Clinician: Referring Provider: Treating Provider/Extender:Doneta Bayman, [name], [name] in Treatment: 19 Diagnosis Coding ICD-10 Codes Code Description [phone] Other chronic osteomyelitis, right ankle and foot L97.514 Non-pressure chronic ulcer of other part of right foot with necrosis of bone E10.621 Type 1 diabetes mellitus with foot ulcer L02.415 Cutaneous abscess of right lower limb Facility Procedures CPT4 Code: [id number] Description: 99214 - WOUND CARE VISIT-LEV 4 EST PT Modifier: Quantity: 1 Physician Procedures CPT4 Code Description: [id number] [facility] -  [facility] PHYS LEVEL 3 - EST PT ICD-10 Diagnosis Description L97.514 Non-pressure chronic ulcer of other part of right foot L02.415 Cutaneous abscess of right lower limb Modifier: with necrosis of Quantity: 1 bone Electronic Signature(s) Signed: 01/20/2019 6:17:56 PM By: Linton Ham MD Signed: 01/21/2019 6:50:57 PM By: [name] RN, BSN Previous Signature: 01/19/2019 5:54:03 PM Version By: Linton Ham MD Entered By: [name] on 01/19/2019 18:46:50

## 2019-01-22 ENCOUNTER — Ambulatory Visit (HOSPITAL_COMMUNITY)
Admission: RE | Admit: 2019-01-22 | Discharge: 2019-01-22 | Disposition: A | Discharge status: Home / Self Care | Payer: Medicare HMO | Source: Ambulatory Visit | Attending: Internal Medicine | Admitting: Internal Medicine

## 2019-01-22 ENCOUNTER — Encounter (HOSPITAL_BASED_OUTPATIENT_CLINIC_OR_DEPARTMENT_OTHER): Payer: Medicare HMO | Admitting: Internal Medicine

## 2019-01-22 ENCOUNTER — Other Ambulatory Visit: Payer: Self-pay

## 2019-01-22 DIAGNOSIS — L97514 Non-pressure chronic ulcer of other part of right foot with necrosis of bone: Secondary | ICD-10-CM

## 2019-01-22 DIAGNOSIS — M86671 Other chronic osteomyelitis, right ankle and foot: Secondary | ICD-10-CM

## 2019-01-22 DIAGNOSIS — L02415 Cutaneous abscess of right lower limb: Secondary | ICD-10-CM | POA: Insufficient documentation

## 2019-01-23 ENCOUNTER — Encounter (HOSPITAL_BASED_OUTPATIENT_CLINIC_OR_DEPARTMENT_OTHER): Payer: Medicare HMO | Admitting: Internal Medicine

## 2019-01-25 ENCOUNTER — Other Ambulatory Visit: Payer: Self-pay | Admitting: Endocrinology

## 2019-01-26 ENCOUNTER — Other Ambulatory Visit: Payer: Self-pay

## 2019-01-26 ENCOUNTER — Encounter (HOSPITAL_BASED_OUTPATIENT_CLINIC_OR_DEPARTMENT_OTHER): Payer: Medicare HMO | Admitting: Internal Medicine

## 2019-01-26 DIAGNOSIS — E10621 Type 1 diabetes mellitus with foot ulcer: Secondary | ICD-10-CM | POA: Diagnosis not present

## 2019-01-26 NOTE — Progress Notes (Addendum)
HOLLYANN, PABLO ([id number]) Visit Report for 01/26/2019 Arrival Information Details Patient Name: Date of Service: [name], [name] 01/26/2019 1:30 PM Medical Record Number:[id number] Patient Account Number: [id number] Date of Birth/Sex: Treating RN: 1971-03-11 (48 y.o. F) Dwiggins, Shannon Primary Care Nickolas Chalfin: [name] Other Clinician: Referring [name]: Treating Kechia Yahnke/Extender:[name], [name], [name] in Treatment: 20 Visit Information History Since Last Visit Added or deleted any medications: No Patient Arrived: Wheel Chair Any new allergies or adverse reactions: No Arrival Time: 14:17 Had a fall or experienced change in No activities of daily living that may affect Accompanied By: self risk of falls: Transfer Assistance: None Signs or symptoms of abuse/neglect since last No Patient Identification Verified: Yes visito Secondary Verification Process Completed: Yes Hospitalized since last visit: No Patient Requires Transmission-Based No Implantable device outside of the clinic excluding No Precautions: cellular tissue based products placed in the center Patient Has Alerts: No since last visit: Has Dressing in Place as Prescribed: Yes Pain Present Now: No Electronic Signature(s) Signed: 01/26/2019 5:21:50 PM By: [name] Entered By: [name] on 01/26/2019 14:18:14 -------------------------------------------------------------------------------- Clinic Level of Care Assessment Details Patient Name: Date of Service: ADVIKA, [name] 01/26/2019 1:30 PM Medical Record Number:[id number] Patient Account Number: [id number] Date of Birth/Sex: Treating RN: 31-Jan-1971 (48 y.o. Nancy Fetter Primary Care Maytte Jacot: [name] Other Clinician: Referring Kamrynn Melott: Treating Sulayman Manning/Extender:[name], [name], [name] in Treatment: 20 Clinic Level of Care Assessment Items TOOL 4 Quantity Score X - Use when only  an EandM is performed on FOLLOW-UP visit 1 0 ASSESSMENTS - Nursing Assessment / Reassessment X - Reassessment of Co-morbidities (includes updates in patient status) 1 10 X - Reassessment of Adherence to Treatment Plan 1 5 ASSESSMENTS - Wound and Skin Assessment / Reassessment []  - Simple Wound Assessment / Reassessment - one wound 0 X - Complex Wound Assessment / Reassessment - multiple wounds 3 5 []  - Dermatologic / Skin Assessment (not related to wound area) 0 ASSESSMENTS - Focused Assessment []  - Circumferential Edema Measurements - multi extremities 0 []  - Nutritional Assessment / Counseling / Intervention 0 X - Lower Extremity Assessment (monofilament, tuning fork, pulses) 1 5 []  - Peripheral Arterial Disease Assessment (using hand held doppler) 0 ASSESSMENTS - Ostomy and/or Continence Assessment and Care []  - Incontinence Assessment and Management 0 []  - Ostomy Care Assessment and Management (repouching, etc.) 0 PROCESS - Coordination of Care X - Simple Patient / Family Education for ongoing care 1 15 []  - Complex (extensive) Patient / Family Education for ongoing care 0 X - Staff obtains Programmer, systems, Records, Test Results / Process Orders 1 10 X - Staff telephones HHA, Nursing Homes / Clarify orders / etc 1 10 []  - Routine Transfer to another Facility (non-emergent condition) 0 []  - Routine Hospital Admission (non-emergent condition) 0 []  - New Admissions / Biomedical engineer / Ordering NPWT, Apligraf, etc. 0 []  - Emergency Hospital Admission (emergent condition) 0 X - Simple Discharge Coordination 1 10 []  - Complex (extensive) Discharge Coordination 0 PROCESS - Special Needs []  - Pediatric / Minor Patient Management 0 []  - Isolation Patient Management 0 []  - Hearing / Language / Visual special needs 0 []  - Assessment of Community assistance (transportation, D/C planning, etc.) 0 []  - Additional assistance / Altered mentation 0 []  - Support Surface(s) Assessment (bed,  cushion, seat, etc.) 0 INTERVENTIONS - Wound Cleansing / Measurement []  - Simple Wound Cleansing - one wound 0 X - Complex Wound Cleansing - multiple wounds 3 5 X - Wound  Imaging (photographs - any number of wounds) 1 5 []  - Wound Tracing (instead of photographs) 0 []  - Simple Wound Measurement - one wound 0 X - Complex Wound Measurement - multiple wounds 3 5 INTERVENTIONS - Wound Dressings []  - Small Wound Dressing one or multiple wounds 0 X - Medium Wound Dressing one or multiple wounds 1 15 []  - Large Wound Dressing one or multiple wounds 0 X - Application of Medications - topical 1 5 []  - Application of Medications - injection 0 INTERVENTIONS - Miscellaneous []  - External ear exam 0 []  - Specimen Collection (cultures, biopsies, blood, body fluids, etc.) 0 []  - Specimen(s) / Culture(s) sent or taken to Lab for analysis 0 []  - Patient Transfer (multiple staff / Civil Service fast streamer / Similar devices) 0 []  - Simple Staple / Suture removal (25 or less) 0 []  - Complex Staple / Suture removal (26 or more) 0 []  - Hypo / Hyperglycemic Management (close monitor of Blood Glucose) 0 []  - Ankle / Brachial Index (ABI) - do not check if billed separately 0 X - Vital Signs 1 5 Has the patient been seen at the hospital within the last three years: Yes Total Score: 140 Level Of Care: New/Established - Level 4 Electronic Signature(s) Signed: 01/26/2019 6:04:10 PM By: [name] RN, BSN Entered By: [name] on 01/26/2019 15:35:37 -------------------------------------------------------------------------------- Encounter Discharge Information Details Patient Name: Date of Service: [name] 01/26/2019 1:30 PM Medical Record Number:[id number] Patient Account Number: [id number] Date of Birth/Sex: Treating RN: 10-19-1970 (48 y.o. [name] Primary Care Zani Kyllonen: [name] Other Clinician: Referring Erika Slaby: Treating Latyra Jaye/Extender:[name], [name], [name] in  Treatment: 20 Encounter Discharge Information Items Discharge Condition: Stable Ambulatory Status: Wheelchair Discharge Destination: Home Transportation: Private Auto Accompanied By: self Schedule Follow-up Appointment: Yes Clinical Summary of Care: Electronic Signature(s) Signed: 01/26/2019 5:31:38 PM By: Deon Pilling Entered By: Deon Pilling on 01/26/2019 15:19:09 -------------------------------------------------------------------------------- Lower Extremity Assessment Details Patient Name: Date of Service: LURDES, HALTIWANGER 01/26/2019 1:30 PM Medical Record Number:[id number] Patient Account Number: [id number] Date of Birth/Sex: Treating RN: 10/06/1970 (48 y.o. [name] Primary Care Zaniya Mcaulay: [name] Other Clinician: Referring Jocsan Mcginley: Treating Ossie Beltran/Extender:[name], [name], [name] in Treatment: 20 Edema Assessment Assessed: [Left: No] [Right: No] Edema: [Left: Ye] [Right: s] Calf Left: Right: Point of Measurement: cm From Medial Instep cm 28 cm Ankle Left: Right: Point of Measurement: cm From Medial Instep cm 21.5 cm Vascular Assessment Pulses: Dorsalis Pedis Palpable: [Right:Yes] Electronic Signature(s) Signed: 01/26/2019 5:21:50 PM By: [name] Entered By: [name] on 01/26/2019 14:19:50 -------------------------------------------------------------------------------- Multi Wound Chart Details Patient Name: Date of Service: [name] 01/26/2019 1:30 PM Medical Record Number:[id number] Patient Account Number: [id number] Date of Birth/Sex: Treating RN: [birth year] (48 y.o. Nancy Fetter Primary Care Selda Jalbert: [name] Other Clinician: Referring Franklin Baumbach: Treating Madissen Wyse/Extender:[name], [name], [name] in Treatment: 20 Vital Signs Height(in): 70 Capillary Blood 400 Glucose(mg/dl): Weight(lbs): 125 Pulse(bpm): 103 Body Mass Index(BMI): 20 Blood Pressure(mmHg):  137/73 Temperature(F): 99.2 Respiratory 18 Rate(breaths/min): Photos: [43:No Photos] [44:No Photos] [46:No Photos] Wound Location: [43:Right Foot - Medial] [44:Right Calcaneus] [46:Right Lower Leg - Lateral] Wounding Event: [43:Gradually Appeared] [44:Gradually Appeared] [46:Other Lesion] 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] [44:Lower Extremity] Comorbid History: [43:Cataracts, Chronic sinus Cataracts, Chronic sinus Cataracts, Chronic sinus problems/congestion, Anemia, Sleep Apnea, Deep Anemia, Sleep Apnea, Deep Anemia, Sleep Apnea, Deep Vein Thrombosis, Hypertension, Peripheral Hypertension,  Peripheral Hypertension, Peripheral Arterial Disease, Type I Diabetes, Osteoarthritis, Osteomyelitis,  Neuropathy, Osteomyelitis, Neuropathy, Osteomyelitis, Neuropathy, Seizure Disorder] [44:problems/congestion, Vein Thrombosis, Arterial Disease, Type I  Diabetes, Osteoarthritis, Seizure Disorder] [46:problems/congestion, Vein Thrombosis, Arterial Disease, Type I Diabetes, Osteoarthritis, Seizure Disorder] Date Acquired: [43:08/04/2018] [44:08/28/2018] [46:12/25/2018] Weeks of Treatment: [43:20] [44:20] [46:4] Wound Status: [43:Open] [44:Open] [46:Open] Clustered Wound: [43:Yes] [44:No] [46:Yes] Clustered Quantity: [43:2] [44:N/A] [46:2] Measurements L x W x D 7.5x4.2x2.5 [44:0.9x1x0.1] [46:3.2x2.5x2.5] (cm) Area (cm) : [43:24.74] [44:0.707] [46:6.283] Volume (cm) : [43:61.85] [44:0.071] [46:15.708] % Reduction in Area: [43:65.90%] [44:71.90%] [46:-7853.20%] % Reduction in Volume: -184.10% [44:71.70%] [46:-19783.50%] Position 1 (o'clock): 6 Maximum Distance 1 [43:2.5] (cm): Tunneling: [43:Yes] [44:No] [46:No] Classification: [43:Grade 4] [44:Grade 2] [46:Full Thickness Without Exposed Support Structures] Exudate Amount: [43:Large] [44:Small] [46:Large] Exudate Type: [43:Purulent] [44:Serosanguineous] [46:Purulent] Exudate Color:  [43:yellow, brown, green] [44:red, brown] [46:yellow, brown, green] Wound Margin: [43:Distinct, outline attached] [44:Flat and Intact] [46:Distinct, outline attached] Granulation Amount: [43:Medium (34-66%)] [44:Small (1-33%)] [46:Small (1-33%)] Granulation Quality: [43:Red, Pink] [44:Pink] [46:Pink, Pale] Necrotic Amount: [43:Medium (34-66%)] [44:Large (67-100%)] [46:Large (67-100%)] Necrotic Tissue: [43:Eschar, Adherent Slough] [44:Eschar] [46:Adherent Slough] Exposed Structures: [43:Fat Layer (Subcutaneous Tissue) Exposed: Yes Tendon: Yes Muscle: Yes Fascia: No Joint: No Bone: No None] [44:Fascia: No Fat Layer (Subcutaneous Tissue) Exposed: No Tendon: No Muscle: No Joint: No Bone: No None] [46:Fat Layer (Subcutaneous Tissue)  Exposed: Yes Fascia: No Tendon: No Muscle: No Joint: No Bone: No None] Treatment Notes Wound #43 (Right, Medial Foot) 1. Cleanse With Wound Cleanser 3. Primary Dressing Applied Calcium Alginate Ag 4. Secondary Dressing Dry [name] Roll [name] 5. Secured With Tape Wound #44 (Right Calcaneus) 1. Cleanse With Wound Cleanser 3. Primary Dressing Applied Calcium Alginate Ag 4. Secondary Dressing Dry [name] Roll [name] 5. Secured With Tape Wound #46 (Right, Lateral Lower Leg) 1. Cleanse With Wound Cleanser 3. Primary Dressing Applied Calcium Alginate Ag 4. Secondary Dressing Dry [name] Roll [name] 5. Secured With Recruitment consultant) Signed: 01/26/2019 5:48:14 PM By: Linton Ham MD Signed: 01/26/2019 6:04:10 PM By: [name] RN, BSN Entered By: Linton Ham on 01/26/2019 15:18:58 -------------------------------------------------------------------------------- Multi-Disciplinary Care Plan Details Patient Name: Date of Service: [name] 01/26/2019 1:30 PM Medical Record Number:[id number] Patient Account Number: [id number] Date of Birth/Sex: Treating RN: 1970-05-19 (48 y.o. Nancy Fetter Primary Care Osker Ayoub: [name] Other Clinician: Referring Andersen Mckiver: Treating Clydean Posas/Extender:[name], [name], [name] in Treatment: 20 Active Inactive Wound/Skin Impairment Nursing Diagnoses: Impaired tissue integrity Knowledge deficit related to ulceration/compromised skin integrity Goals: Patient/caregiver will verbalize understanding of skin care regimen Date Initiated: 09/03/2018 Target Resolution Date: 02/27/2019 Goal Status: Active Ulcer/skin breakdown will have a volume reduction of 30% by week 4 Date Initiated: 09/03/2018 Date Inactivated: 10/07/2018 Target Resolution Date: 10/01/2018 Unmet Goal Status: Unmet Reason: COMORBITIES Ulcer/skin breakdown will have a volume reduction of 50% by week 8 Date Initiated: 10/07/2018 Date Inactivated: 11/03/2018 Target Resolution Date: 10/31/2018 Unmet Goal Status: Unmet Reason: Osteomyelitis Interventions: Assess patient/caregiver ability to obtain necessary supplies Assess patient/caregiver ability to perform ulcer/skin care regimen upon admission and as needed Assess ulceration(s) every visit Provide education on ulcer and skin care Treatment Activities: Skin care regimen initiated : 09/03/2018 Topical wound management initiated : 09/03/2018 Notes: Electronic Signature(s) Signed: 01/26/2019 6:04:10 PM By: [name] RN, BSN Entered By: [name] on 01/26/2019 15:02:36 -------------------------------------------------------------------------------- Pain Assessment Details Patient Name: Date of Service: [name] 01/26/2019 1:30 PM Medical Record Number:[id number] Patient Account Number: [id number] Date of Birth/Sex: Treating RN: Jul 01, 1970 (48 y.o. [name] Primary Care Jabir Dahlem: [name] Other Clinician: Referring Davonne Baby: Treating Slade Pierpoint/Extender:[name], [name],  [name] in Treatment: 20 Active Problems Location of Pain Severity and Description of Pain Patient Has Paino No Site  Locations Pain Management and Medication Current Pain Management: Electronic Signature(s) Signed: 01/26/2019 5:21:50 PM By: [name] Entered By: [name] on 01/26/2019 14:19:17 -------------------------------------------------------------------------------- Patient/Caregiver Education Details Patient Name: Date of Service: [name] 10/26/2020andnbsp1:30 PM Medical Record Patient Account Number: [id number] Number: Treating RN: [name] Date of Birth/Gender: [birth year] (48 y.o. F) Other Clinician: Primary Care Physician: Katharine Look Referring Physician: Physician/Extender: Juanito Doom in Treatment: 20 Education Assessment Education Provided To: Patient Education Topics Provided Infection: Methods: Explain/Verbal Responses: State content correctly Wound/Skin Impairment: Methods: Explain/Verbal Responses: State content correctly Electronic Signature(s) Signed: 01/26/2019 6:04:10 PM By: [name] RN, BSN Entered By: [name] on 01/26/2019 15:02:54 -------------------------------------------------------------------------------- Wound Assessment Details Patient Name: Date of Service: [name] 01/26/2019 1:30 PM Medical Record Number:[id number] Patient Account Number: [id number] Date of Birth/Sex: Treating RN: 12-08-1970 (48 y.o. [name] Primary Care Dorr Perrot: [name] Other Clinician: Referring Selenia Mihok: Treating Dahlia Nifong/Extender:[name], [name], [name] in Treatment: 20 Wound Status Wound Number: 43 Primary Diabetic Wound/Ulcer of the Lower Extremity Etiology: Wound Location: Right Foot - Medial Wound Open Wounding Event: Gradually Appeared Status: Date Acquired: 08/04/2018 Comorbid Cataracts, Chronic sinus problems/congestion, Weeks Of Treatment: 20 History: Anemia, Sleep Apnea, Deep Vein Thrombosis, Clustered Wound:  Yes Hypertension, Peripheral Arterial Disease, Type I Diabetes, Osteoarthritis, Osteomyelitis, Neuropathy, Seizure Disorder Photos Wound Measurements Length: (cm) 7.5 Width: (cm) 4.2 Depth: (cm) 2.5 Clustered Quantity: 2 Clustered Quantity: 2 Area: (cm) 24.74 Volume: (cm) 61.85 % Reduction in Area: 65.9% % Reduction in Volume: -184.1% Epithelialization: None Tunneling: Yes Tunneling: Yes Position (o'clock): 6 Maximum Distance: (cm) 2.5 Undermining: No Wound Description Classification: Grade 4 Wound Margin: Distinct, outline attached Exudate Amount: Large Exudate Type: Purulent Exudate Color: yellow, brown, green Wound Bed Granulation Amount: Medium (34-66%) Granulation Quality: Red, Pink Necrotic Amount: Medium (34-66%) Necrotic Quality: Eschar, Adherent Slough Foul Odor After Cleansing: No Slough/Fibrino Yes Exposed Structure Fascia Exposed: No Fat Layer (Subcutaneous Tissue) Exposed: Yes Tendon Exposed: Yes Muscle Exposed: Yes Necrosis of Muscle: Yes Joint Exposed: No Bone Exposed: No Treatment Notes Wound #43 (Right, Medial Foot) 1. Cleanse With Wound Cleanser 3. Primary Dressing Applied Calcium Alginate Ag 4. Secondary Dressing Dry [name] Roll [name] 5. Secured With Recruitment consultant) Signed: 01/27/2019 3:44:28 PM By: [name] EMT/HBOT Signed: 01/27/2019 5:30:52 PM By: [name] Previous Signature: 01/26/2019 5:21:50 PM Version By: [name] Entered By: [name] on 01/27/2019 12:55:09 -------------------------------------------------------------------------------- Wound Assessment Details Patient Name: Date of Service: [name] 01/26/2019 1:30 PM Medical Record Number:[id number] Patient Account Number: [id number] Date of Birth/Sex: Treating RN: 1970-07-03 (48 y.o. [name] Primary Care Junella Domke: [name] Other Clinician: Referring Dmitriy Gair: Treating Meriam Chojnowski/Extender:[name],  [name], [name] in Treatment: 20 Wound Status Wound Number: 44 Primary Diabetic Wound/Ulcer of the Lower Extremity Etiology: Etiology: Wound Location: Right Calcaneus Wound Open Wounding Event: Gradually Appeared Status: Date Acquired: 08/28/2018 Comorbid Cataracts, Chronic sinus problems/congestion, Weeks Of Treatment: 20 History: Anemia, Sleep Apnea, Deep Vein Thrombosis, Clustered Wound: No Hypertension, Peripheral Arterial Disease, Type I Diabetes, Osteoarthritis, Osteomyelitis, Neuropathy, Seizure Disorder Wound Measurements Length: (cm) 0.9 Width: (cm) 1 Depth: (cm) 0.1 Area: (cm) 0.707 Volume: (cm) 0.071 Wound Description Classification: Grade 2 Wound Margin: Flat and Intact Exudate Amount: Small Exudate Type: Serosanguineous Exudate Color: red, brown Wound Bed Granulation Amount: Small (1-33%) Granulation Quality: Pink Necrotic Amount: Large (67-100%) Necrotic Quality:  Eschar After Cleansing: No rino No Exposed Structure sed: No Subcutaneous Tissue) Exposed: No sed: No sed: No ed: No d: No % Reduction in Area: 71.9% % Reduction in Volume: 71.7% Epithelialization: None Tunneling: No Undermining: No Foul Odor Slough/Fib Fascia Expo Fat Layer ( Tendon Expo Muscle Expo Joint Expos Bone Expose Treatment Notes Wound #44 (Right Calcaneus) 1. Cleanse With Wound Cleanser 3. Primary Dressing Applied Calcium Alginate Ag 4. Secondary Dressing Dry [name] Roll [name] 5. Secured With Recruitment consultant) Signed: 01/26/2019 5:21:50 PM By: [name] Entered By: [name] on 01/26/2019 14:30:09 -------------------------------------------------------------------------------- Wound Assessment Details Patient Name: Date of Service: [name] 01/26/2019 1:30 PM Medical Record Number:[id number] Patient Account Number: [id number] Date of Birth/Sex: Treating RN: 1971/02/10 (48 y.o. [name] Primary  Care Stanislawa Gaffin: Other Clinician: [name] Referring Adonay Scheier: Treating Victorian Gunn/Extender:[name], [name], [name] in Treatment: 20 Wound Status Wound Number: 46 Primary Abscess Etiology: Wound Location: Right Lower Leg - Lateral Wound Open Wounding Event: Other Lesion Status: Date Acquired: 12/25/2018 Notes: MD IandD today. Weeks Of Treatment: 4 Comorbid Cataracts, Chronic sinus problems/congestion, Clustered Wound: Yes History: Anemia, Sleep Apnea, Deep Vein Thrombosis, Hypertension, Peripheral Arterial Disease, Type I Diabetes, Osteoarthritis, Osteomyelitis, Neuropathy, Seizure Disorder Photos Wound Measurements Length: (cm) 3.2 % Reduct Width: (cm) 2.5 % Reduct Depth: (cm) 2.5 Epitheli Clustered Quantity: 2 Tunnelin Area: (cm) 6.283 Undermi Volume: (cm) 15.708 Wound Description Classification: Full Thickness Without Exposed Support Foul Odo Structures Slough/F Wound Distinct, outline attached Margin: Exudate Large Amount: Exudate Purulent Type: Exudate yellow, brown, green Color: Wound Bed Granulation Amount: Small (1-33%) Granulation Quality: Pink, Pale Fascia E Necrotic Amount: Large (67-100%) Fat Laye Necrotic Quality: Adherent Slough Tendon E Muscle E Joint Ex Bone Exp r After Cleansing: No ibrino Yes Exposed Structure xposed: No r (Subcutaneous Tissue) Exposed: Yes xposed: No xposed: No posed: No osed: No ion in Area: -7853.2% ion in Volume: -19783.5% alization: None g: No ning: No Treatment Notes Wound #46 (Right, Lateral Lower Leg) 1. Cleanse With Wound Cleanser 3. Primary Dressing Applied Calcium Alginate Ag 4. Secondary Dressing Dry [name] Roll [name] 5. Secured With Recruitment consultant) Signed: 01/27/2019 3:44:28 PM By: [name] EMT/HBOT Signed: 01/27/2019 5:30:52 PM By: [name] Previous Signature: 01/26/2019 5:21:50 PM Version By: [name] Entered By: [name] on  01/27/2019 12:55:34 -------------------------------------------------------------------------------- Vitals Details Patient Name: Date of Service: [name] 01/26/2019 1:30 PM Medical Record Number:[id number] Patient Account Number: [id number] Date of Birth/Sex: Treating RN: Mar 18, 1971 (48 y.o. [name] Primary Care Devere Brem: [name] Other Clinician: Referring Kately Graffam: Treating Jhania Etherington/Extender:[name], [name], [name] in Treatment: 20 Vital Signs Time Taken: 14:15 Temperature (F): 99.2 Height (in): 67 Pulse (bpm): 103 Weight (lbs): 125 Respiratory Rate (breaths/min): 18 Body Mass Index (BMI): 19.6 Blood Pressure (mmHg): 137/73 Capillary Blood Glucose (mg/dl): 400 Reference Range: 80 - 120 mg / dl Notes Patient reported last CBG was 400 last night Electronic Signature(s) Signed: 01/26/2019 5:21:50 PM By: [name] Entered By: [name] on 01/26/2019 14:19:09

## 2019-01-26 NOTE — Progress Notes (Signed)
[name], [name] ([id number]) Visit Report for 01/26/2019 HPI Details Patient Name: Date of Service: [name], [name] 01/26/2019 1:30 PM Medical Record Number:[id number] Patient Account Number: [id number] Date of Birth/Sex: Treating RN: May 15, 1970 (48 y.o. Nancy Fetter Primary Care Provider: [name] Other Clinician: Referring Provider: Treating Provider/Extender:Jaella Weinert, [name], [name] in Treatment: 20 History of Present Illness HPI Description: 48 year old diabetic who is known to have type 1 diabetes which is poorly controlled last hemoglobin A1c was 11%. She comes in with a ulcerated area on the left lateral foot which has been there for over 6 months. Was recently she has been treated by [name] of podiatry who saw her last on 05/28/2016. Review of his notes revealed that the patient had incision and drainage with placement of antibiotic beads to the left foot on 04/11/2016 for possible osteomyelitis of the cuboid bone. Over the last year she's had a history of amputation of the left fifth toe and a femoropopliteal popliteal bypass graft somewhere in April 2017. 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 His note [name] mentions that the patient has been referred to me for further wound care and possibly great candidate for hyperbaric oxygen therapy due to recurrent osteomyelitis. However we do not have any x-rays of biopsy reports confirming this. He has been on several antibiotics including Bactrim and most recently is on doxycycline for an MRSA. I understand, the patient was not a candidate for IV antibiotics as she has had previous PICC lines which resulted in blood clots in both arms. There was a x-ray report dated 04/04/2016 on [name] notes which showed evidence of fifth ray resection left foot with osteolytic changes noted to the fourth metatarsal and cuboid bone on the left. 06/13/2016 -- had a left foot x-ray which showed  no acute fracture or dislocation and no definite radiographic evidence of osteomyelitis. Advanced osteopenia was seen. 06/20/2016 -- she has noticed a new wound on the right plantar foot in the region where she had a callus before. 06/27/16- the patient did have her x-ray of the right foot which showed no findings to suggest osteomyelitis. She saw her endocrinologist, [name], yesterday. Her A1c in January was 11. He also indicates mismanagement and noncompliance regarding her diabetes. She is currently on Bactrim for a lip infection. She is complaining of nausea, vomiting and diarrhea. She is unable to articulate the exact orders or dosing of the Bactrim; it is unclear when she will complete this. 07/04/2016 -- results from [facility] of ABIs with ankle waveforms were noted from 02/14/2016. The examination done on 06/27/2015 showed noncompressible ABIs with the right being 1.45 and the left being 1.33. The present examination showed a right ABI of 1.19 on the left of 1.33. The conclusion was that right normal ABI in the lower extremity at rest however compared to previous study which was noncompressible ABI may be falsely elevated side suggesting medial calcification. The left ABI suggested medial calcification. 08/01/2016 -- the patient had more redness and pain on her right foot and did not get to come to see as noted she see her PCP or go to the ER and decided to take some leftover metronidazole which she had at home. As usual, the patient does report she feels and is rather noncompliant. 08/08/2016 -- -- x-ray of the right foot -- FINDINGS:Transmetatarsal amputation is noted. No bony destruction is noted to suggest osteomyelitis. IMPRESSION: No evidence of osteomyelitis. Postsurgical changes are seen. MRI would be more sensitive  for possible bony changes. Culture has grown Serratia Marcescens -- sensitive to Bactrim, ciprofloxacin, ceftazidime she was seen by [name] on  08/06/2016. He did not find any exposed bone, muscle, tendon, ligament or joint. There was no malodor and he did a excisional debridement in the office. ============ Old notes: 48 year old patient who is known to the wound clinic for a while had been away from the wound clinic since 09/01/2014. Over the last several months she has been admitted to various hospitals including [place] at [facility]. She was treated for a right metatarsal osteomyelitis with a transmetatarsal amputation and this was done about 2 months ago. He has a small ulcerated area on the right heel and she continues to have an ulcerated area on the left plantar aspect of the foot. The patient was recently admitted to the [facility] hospital group between 7/12 and 10/18/2014. she was given 3 weeks of IV vancomycin and was to follow-up with her surgeons at [facility] and also took oral vancomycin for C. difficile colitis. 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 MRI of the left foot done on 10/14/2014 showed no abscess or osteomyelitis. 04/27/15; this is a patient we know from previous stays in the wound care center. She is a type I diabetic I am not sure of her control currently. Since the last time I saw her she is had a right transmetatarsal amputation and has no wounds on her right foot and has no open wounds. She is been followed at the wound care center at [facility] in [place]. She comes today with the desire to undergo hyperbaric treatment locally. Apparently one of her wound care providers in [place] has suggested hyperbarics. This is in response to an MRI from 04/18/15 that showed increased marrow signal and loss of the proximal  fifth metatarsal cortex evidence of osteomyelitis with likely early osteomyelitis in the cuboid bone as well. She has a large wound over the base of the fifth metatarsal. She also has a eschar over her the tips of her toes on 1,3 and 5. She does not have peripheral pulses and apparently is going for an angiogram tomorrow which seems reasonable. After this she is going to infectious disease at [facility]. They have been using Medihoney to the large wound on the lateral aspect of the left foot to. The patient has known Charcot deformity from diabetic neuropathy. She also has known diabetic PAD. Surprisingly I can't see that she has had any recent antibiotics, the patient states the last antibiotic she had was at the end of November for 10 days. I think this was in response to culture that showed group G strep although I'm not exactly sure where the culture was from. She is also had arterial studies on 03/29/15. This showed a right ABI of 1.4 that was noncompressible. Her left ABI was 0.73. 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 It was not felt that arterial inflow was adequate for healing of a foot ulcer. Her Doppler waveforms looked monophasic ===== READMISSION 02/28/17; this is in an now 48 year old woman we've had at several different occasions in this clinic. 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 She has a remote ex-smoker. She was last seen in this  clinic by Dr. Con Memos I think in May. More recently she is been followed by her podiatrist [name] an infectious disease Dr. Megan Salon. She has 2 open wounds the major one is over the right first metatarsal head she also has a wound on the left plantar foot. an MRI of the right foot on 01/01/17 showed a soft tissue ulcer along the plantar aspect of the first metatarsal base consistent with osteomyelitis of the first metatarsal stump. Dr. 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 According to the patient this is been open for slightly over a month. She has been on a combination of Cipro 500 twice a day, Zyvox 600 twice a day and Flagyl 500 3 times a day for over a month now as directed by Dr. Megan Salon. cultures of the right foot earlier this year showed MRSA in January and Serratia in May. January also had a few viridans strep. Recent x-rays of both feet were done and [name] office and I don't have these reports. The patient has known PAD and has a history of aleft femoropopliteal bypass in April 2017. 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 Dwyane Dee. In spite of this the last hemoglobin A1c I can see is 10.1 on 01/01/2017. She is being referred by 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 She is been using Medihoney to this area and also an area on the left midfoot. She is using healing sandals bilaterally. ABIs in this clinic at the left posterior tibial was 1.1 noncompressible on the right READMISSION Non invasive vascular NOVANT 5/18 Aftercare following surgery of the circulatory system Procedure Note - Interface, External Ris In - 08/13/2016 11:05 AM EDT Procedure: Examination consists of physiologic resting arterial pressures of the brachial and ankle arteries bilaterally with continuous wave Doppler waveform analysis. Previous: Previous exam performed on 02/14/16 demonstrated ABIs of Rt = 1.19 and Lt = 1.33. Right: ABI = non-compressible PT, 1.47 DP. S/P transmet amputation. Left: ABI = 1.52, 2nd digit pressure = 87 mmHg Conclusions: Right: ABI (>1.3) may be falsely elevated, suggesting medial calcification. Left: ABI (>1.3) may be  falsely elevated, suggesting medial calcification The patient is a now 48 year old type I diabetic is had multiple issues her graded to chronic diabetic foot ulcers. 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 We had her in the clinic lastin November. At that point she had wounds on her bilateral feet.she had wanted to try hyperbarics however the healogics review process denied her because she hadn't followed up with her vascular surgeon for her left femoropopliteal bypass. The bypass was done by Dr. Raul Del at [facility]. We made her a follow-up with Dr. Raul Del however she did not keep the appointment and therefore she was not approved The patient shows me a small wound on her left fourth metatarsal head on her phone. She developed rapid discoloration in the plantar aspect of the left foot and she was admitted to hospital from 2/2 through 05/10/17 with wet gangrene of the left foot osteomyelitis of the fourth metatarsal heads. She was admitted acutely ill with a temperature of 103. She was started on broad-spectrum vancomycin and cefepime. On 05/06/17  she was taken to the OR by [name] her podiatric surgeon for an incision and drainage irrigation of the left foot wound. Cultures from this surgery revealed group be strep and anaerobes. she was seen by Dr.Xu of orthopedic surgery and scheduled for a below-knee amputation which she u refused. Ultimately she was discharged on Levaquin and Flagyl for one month. MRI 05/05/17 done while she was in the hospital showed abscess adjacent to the fourth metatarsal head and neck small abscess around the fourth flexor tendon. Inflammatory phlegmon and gas in the soft tissues along the lateral aspect of the fourth phalanx. Findings worrisome for osteomyelitis involving the fourth proximal and middle phalanx and also the third and fourth metatarsals. Finally the patient had actually  shortly before this followed up with Dr. Raul Del at no time on 04/29/17. He felt that her left femoropopliteal bypass was patent he felt that her left-sided toe pressures more than adequate for healing a wound on the left foot. This was before her acute presentation. 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 05/28/17; she is started hyperbarics. The patient tells me that for some reason she was not actually on Levaquin but I think on ciprofloxacin. She was on Flagyl. She only started her Levaquin yesterday due to some difficulty with the pharmacy and perhaps her sister picking it up. She has an appointment with [name] tomorrow and with infectious disease early next week. She has no new complaints 06/06/17; the patient continues in hyperbarics. She saw [name] on 05/29/17 who is her podiatric surgeon. He is elected for a transmetatarsal amputation on 06/27/17. I'm not sure at what level he plans to do this amputation. The patient is unaware She also saw Dr. Megan Salon of infectious disease who elected to continue her on current antibiotics I think this is ciprofloxacin and Flagyl. I'll need to clarify with her tomorrow if she actually has this. We're using silver alginate to the actual wound. Necrotic surface today with material under the flap of her foot. Original MRI showed abscesses as well as osteomyelitis of the proximal and middle fourth phalanx and the third and fourth metatarsal heads 06/11/17; patient continues in hyperbarics and continues on oral antibiotics. She is doing well. The wound looks better. The necrotic part of this under the flap in her superior foot also looks better. she is been to see [name]. I haven't had a chance to look at his note. Apparently he has put the transmetatarsal amputation on hold her request it is still planning to take her to the OR for debridement and product application ACEL. I'll see if I can find his note. I'll therefore leave product ordering/requests  to [name] for now. I was going to look at Dermagraft 06/18/17-she is here in follow-up evaluation for bilateral foot wounds. She continues with hyperbaric therapy. She states she has been applying manuka honey to the right plantar foot and alternate manuka honey and silver alginate to the left foot, despite our orders. We will continue with same treatment plan and she will follo up next week. 06/25/17; I have reviewed [name] last note from 3/11. She has operative debridement in 2 days' time. By review his note apparently they're going to place there is skin over the majority of this wound which is a good choice. She has a small satellite area at the most proximal part of this wound on the left plantar foot. The area on the right plantar foot we've been using silver alginate and it is  close to healing. 07/02/17; unfortunately the patient was not easily approved for [name] proposed surgery. I'm not completely certain what the issue is. She has been using silver alginate to the wound she has completed a first course of hyperbarics. She is still on Levaquin and Flagyl. I have really lost track of the time course here.I suspect she should have another week to 2 of antibiotics. I'll need to see if she is followed up with infectious disease Dr. Megan Salon 07/09/17; the patient is followed up with Dr. Megan Salon. 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 She continues on Levaquin and metronidazole continuing both of these for now I think she is been on fr about 6 weeks. She still has some drainage but no pain. No fever. Her had been plans for her to go to the OR for operative debridement with her podiatrist [name], I am not exactly sure where that is. I'll probably slip a note to [name] today. I note that she follows with Dr. Dwyane Dee of endocrinology. We have her recertified for hyperbaric oxygen. I have not heard about Dermagraft however I'll see if [name]  is planning a skin substitute as well 07/16/17; the patient tells me she is just about out of [place]. I'll need to check Dr. Hale Bogus last notes on this. She states she has plenty of Flagyl however. She comes in today complaining of pain in the right lateral foot which she said lasted for about a day. The wound on the right foot is actually much more medially. She also tells me that the [facility] cost a lot of pain in the left foot wound and she turned back to silver alginate. Finally Dermagraft has a $829 per application co-pay. She cannot afford this 07/23/17; patient arrives today with the wound not much smaller. There is not much new to add. She has not heard from [name] all try to put in a call to them today. She was asking about Dermagraft again and she has an over $562 per application co-pay she states that she would be willing to try to do a payment plan. I been tried to avoid this. We've been using silver alginate, I'll change to [facility] 07/30/17-She is here in follow-up evaluation for left foot ulcer. She continues hyperbaric medicine. The left foot ulcer is stable we will continue with same treatment plan 08/06/17; she is here for evaluation of her left foot ulcer. Currently being treated for hyperbarics or underlying osteomyelitis. She is completed antibiotics. The left foot ulcer is better smaller with healthier looking granulation. For various reasons I am not really clear on we never got her back to the OR with [name]. He did not respond to my secure text message. Nevertheless I think that surgery on this point is not necessary nor am I completely clear that a skin substitute is necessary The patient is complaining about pain on the outside of her right foot. 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 There is no erythema. She also states the foot is warm versus her other part of her upper leg and this is largely true. It is not totally clear to me  what's causing this. She thinks it's different from her usual neuropathy pain 08/13/17; she arrives in clinic today with a small wound which is superficial on her right first metatarsal head. 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 She tells [place] she was up on  her feet over the Mother's Day celebration. The large wound is on the left foot. Continues with hyperbarics for underlying osteomyelitis. We're using Hydrofera Blue. She asked me today about where we were with Dermagraft. I had actually excluded this because of the co- pay however she wants to assume this therefore I'll recheck the co-pay an order for next week. 08/20/17; the patient agreed to accept the co-pay of the first Apligraf which we applied today. She is disappointed she is finishing hyperbarics will run this through the insurance on the extent of the foot infection and the extent of the wound that she had however she is already had 60 dive's. Dermagraft No. 1 08/27/17; Dermagraft No. 2. She is not eligible for any more hyperbaric treatments this month. She reports a fair amount of drainage and she actually changed to the external dressings without disturbing the direct contact layer 09/03/17; the patient arrived in clinic today with the wound superficially looking quite healthy. Nice vibrant red tissue with some advancing epithelialization although not as much adherence of the flap as I might like. However she noted on her own fourth toe some bogginess and she brought that to our attention. Indeed this was boggy feeling like a possibility of subcutaneous fluid. 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 She is not been unwell. We've been using Dermagraft 09/10/17; the culture that I did not last week was MRSA. She saw Dr. Megan Salon this morning who is going to start her on vancomycin. I had sent him a secure a text message yesterday. I also spoke  with her podiatric surgeon [name] about surgery on this foot the options for conserving a functional foot etc. Promised me he would see her and will make back consultation today. Paradoxically her actual wound on the plantar aspect of her left foot looks really quite good. I had given her 5 days worth of Baxdella to cover her for MRSA. Her MRI came back showing osteomyelitis within the third metatarsal shaft and head and base of the third and fourth proximal phalanx. She had extensive inflammatory changes throughout the soft tissue of the lateral forefoot. With an ill-defined fluid around the fourth metatarsal extending into the plantar and dorsal soft tissues 09/19/17; the patient is actually on oral Septra and Flagyl. She apparently refused IV vancomycin. She also saw [name] at my request who is planning her for a left BKA sometime in mid July. MRI showed osteomyelitis within the third metatarsal shaft and head and the basis of the third and fourth proximal phalanx. I believe there was felt to be possible septic arthritis involving the third MTP. 09/26/17; the patient went back to Dr. Megan Salon at my suggestion and is now receiving IV daptomycin. Her wound continues to look quite good making the decision to proceed with a transmetatarsal amputation although more difficult for the patient. I believe in my extensive discussions with her she has a good sense of the pros and cons of this. I don't NV the tuft decision she has to make. She has an appointment with [name] I believe in mid July and I previously spoken to him about this issue Has we had used 3 previous Dermagraft. Given the condition of the wound surface I went ahead and added the fourth one today area and I did this not fully realizing that she'll be traveling to [place] next week. I'm hopeful she can come  back in 2 weeks 10/21/17; Her same Dermagraft on for about 3-1/2 weeks. In spite of this the wound arrives looking quite  healthy. There is been a lot of healing dimensions are smaller. Looking at the square shaped wound she has now there is some undermining and some depth medially under the undermining although I cannot palpate any bone. No surrounding infection is obvious. She has difficult questions about how to look at this going forward vis--vis amputations versus continued medical therapy. To be truthful the wound is looks so healthy and it is continued to contract. Hard to justify foot surgery at this point although I still told her that I think it might come to that if we are not able to eradicate the underlying MRSA. She is still highly at risk and she understands this 11/06/17 on evaluation today patient appears to be doing better in regard to her foot ulcer. She's been tolerating the dressing changes without complication. Currently she is here for her Dermagraft #6. Her wound continues to make excellent progress at this point. She does not appear to have any evidence of infection which is good news. 11/13/17 on evaluation today patient appears to be doing excellent at this time. She is here for repeat Dermagraft application. This is #7. Overall her wound seems to be making great progress. 12/05/17; the patient arrives with the wound in much better condition than when I last saw this almost 6 weeks ago. She still has a small probing area in the left metatarsal head region on the lateral aspect of her foot. We applied her last Dermagraft today. Since the last time she is here she has what appears to a been a blood blister on the plantar aspect of left foot although I don't see this is threatening. There is also a thick raised tissue on the right mid metatarsal head region. This was not there I don't think the last time she was here 3 weeks ago. 12/12/17; the patient continues to have a small programming area in the left metatarsal head region on the lateral aspect of her foot which was the initial large surgical  wound. I applied her last Apligraf last week. I'm going to use Endoform starting today Unfortunately she has an excoriated area in the left mid foot and the right mid foot. The left midfoot looks like a blistered area this was not opened last week it certainly is open today. Using silver alginate on these areas. She promises me she is offloading this. 12/19/17; the small probing area in the left metatarsal head eyes think is shallower. In general her original wound looks better. We've been using Endoform. The area inferiorly that I think was trauma last week still requires debridement a lot of nonviable surface which I removed. She still has an open open area distally in her foot Similarly on the right foot there is tightly adherent surface debris which I removed. Still areas that don't look completely epithelialized. This is a small open area. We used silver alginate on these areas 12/26/2017; the patient did not have the supplies we ordered from last week including the Endoform. The original large wound on the left lateral foot looks healthy. She still has the undermining area that is largely unchanged from last week. She has the same heavily callused raised edged wounds on the right mid and left midfoot. Both of these requiring debridement. We have been using silver alginate on these areas 01/02/2018; there is still supply issues. We are going to try  to use Prisma but I am not sure she actually got it from what she is saying. She has a new open area on the lateral aspect of the left fourth toe [previous fifth ray amputation]. Still the one tunneling area over the fourth metatarsal head. The area is in the midfoot bilaterally still have thick callus around them. She is concerned about a raised swelling on the lateral aspect of the foot. However she is completely insensate 01/10/2018; we are using Prisma to the wounds on her bilateral feet. Surprisingly the tunneling area over the left fourth  metatarsal head that was part of her original surgery has closed down. She has a small open area remaining on the incision line. 2 open areas in the midfoot. 02/10/2018; the patient arrives back in clinic after a month hiatus. She was traveling to visit family in [place]. Is fairly clear she was not offloading the areas on her feet. The original wound over the left lateral foot at the level of metatarsal heads is reopened and probes medially by about a centimeter or 2. She notes that a week ago she had purulent drainage come out of an area on the left midfoot. Paradoxically the worst area is actually on the right foot is extensive with purulent drainage. We will use silver alginate today 02/17/2018; the patient has 3 wounds one over the left lateral foot. She still has a small area over the metatarsal heads which is the remnant of her original surgical wound. This has medial probing depth of roughly 1.4 cm somewhat better than last week. The area on the right foot is larger. We have been using silver alginate to all areas. The area on the right foot and left foot that we cultured last week showed both Klebsiella and Proteus. Both of these are quinolone sensitive. The patient put her's self on Bactrim and Flagyl that she had left hanging around from prior antibiotic usages. She was apparently on this last week when she arrived. I did not realize this. Unfortunately the Bactrim will not cover either 1 of these organisms. We will send in Cipro 500 twice daily for a week 03/04/2018; the patient has 2 wounds on the left foot one is the original wound which was a surgical wound for a deep DFU. At one point this had exposed bone. She still has an area over the fourth metatarsal head that probes about 1.4 cm although I think this is better than last week. I been using silver nitrate to try and promote tissue adherence and been using silver alginate here. She also has an area in the left midfoot. This has  some depth but a small linear wound. Still requiring debridement. On the right midfoot is a circular wound. A lot of thick callus around this area. We have been using silver alginate to all wound areas She is completed the ciprofloxacin I gave her 2 weeks ago. 03/11/2018; the patient continues to have 2 open areas on the left foot 1 of which was the original surgical wound for a deep DFU. Only a small probing area remains although this is not much different from last week we have been using silver alginate. The other area is on the midfoot this is smaller linear but still with some depth. We have been using silver alginate here as well On the right foot she has a small circular wound in the mid aspect. This is not much smaller than last time. We have been using silver alginate here as well  03/18/2018; she has 3 wounds on the left foot the original surgical wound, a very superficial wound in the mid aspect and then finally the area in the mid plantar foot. She arrives in today with a very concerning area in the wound in the mid plantar foot which is her most proximal wound. There is undermining here of roughly 1-1/2 cm superiorly. Serosanguineous drainage. She tells me she had some pain on for over the weekend that shot up her foot into her thigh and she tells me that she had a nodule in the groin area. She has the single wound in the right foot. We are using endoform to both wound areas 03/24/2018; the patient arrives with the original surgical wound in the area on the left midfoot about the same as last week. There is a collection of fluid under the surface of the skin extending from the surgical wound towards the midfoot although it does not reach the midfoot wound. The area on the right foot is about the same. Cultures from last week of the left midfoot wound showed abundant Klebsiella abundant Enterococcus faecalis and moderate methicillin resistant staph I gave her Levaquin but this would  have only covered the Klebsiella. She will need linezolid 04/01/2018; she is taking linezolid but for the first few days only took 1 a day. I have advised her to finish this at twice daily dosing. In any case all of her wounds are a lot better especially on the left foot. The original surgical wound is closed. The area on the left midfoot considerably smaller. The area on the right foot also smaller. 04/08/2018; her original surgical wound/osteomyelitis on the left foot remains closed. She has area on the left foot that is in the midfoot area but she had some streaking towards this. This is not connected with her original wound at least not visually. Small wound on the right midfoot appears somewhat smaller. 04/15/18; both wounds looks better. Original wound is better left midfoot. Using silver alginate 1/21; patient states she uses saltwater soak in, stones or remove callus from around her wounds. She is also concerned about a blood blister she had on the left foot but it simply resolved on its own. We've been using silver alginate 1/28; the patient arrives today with the same streaking area from her metatarsals laterally [the site of her original surgical wound] down to the middle of her foot. There is some drainage in the subcutaneous area here. This concerns me that there is actually continued ongoing infection in the metatarsals probably the fourth and third. This fixates an MRI of the foot without contrast [chronic renal failure] The wound in the mid part of the foot is small but I wonder whether this area actually connects with the more distal foot. The area on the right midfoot is probably about the same. Callus thick skin around the small wound which I removed with a curette we have been using silver alginate on both wound areas 2/4; culture I did of the draining site on the left foot last time grew methicillin sensitive staph aureus. MRI of the left foot showed interval resolution of the  findings surrounding the third metatarsal joint on the prior study consistent with treated osteomyelitis. Chronic soft tissue ulceration in the plantar and lateral aspect of the forefoot without residual focal fluid collection. No evidence of recurrent osteomyelitis. Noted to have the previous amputation of the distal first phalanx and fifth ray MRI of the right foot showed no evidence of  osteomyelitis I am going to treat the patient with a prolonged course of antibiotics directed against MSSA in the left foot 2/11; patient continues on cephalexin. She tells me she had nausea and vomiting over the weekend and missed 2 days. In general her foot looks much the same. She has a small open area just below the left fourth metatarsal head. A linear area in the left midfoot. Some discoloration extending from the inferior part of this into the left lateral foot although this appears to be superficial. She has a small area on the right midfoot which generally looks smaller after debridement 2/18; the patient is completing his cephalexin and has another 2 days. She continues to have open areas on the left and right foot. 2/25; she is now off antibiotics. The area on the left foot at the site of her original surgical wound has closed yet again. She still has open areas in the mid part of her foot however these appear smaller. The area on the right mid foot looks about the same. We have been using silver alginate She tells me she had a serious hypoglycemic spell at home. She had to have EMS called and get IV dextrose 3/3; disappointing on the left lateral foot large area of necrotic tissue surrounding the linear area. This appears to track up towards the same original surgical wound. Required extensive debridement. The area on the right plantar foot is not a lot better also using silver 3/12; the culture I did last time showed abundant enterococcus. I have prescribed Augmentin, should cover any unrecognized  anaerobes as well. In addition there were a few MRSA and Serratia that would not be well covered although I did not want to give her multiple antibiotics. She comes in today with a new wound in the right midfoot this is not connected with the original wound over her MTP a lot of thick callus tissue around both wounds but once again she said she is not walking on these areas 3/17-Patient comes in for follow-up on the bilateral plantar wounds, the right midfoot and the left plantar wound. Both these are heavily callused surrounding the wounds. We are continuing to use silver alginate, she is compliant with offloading and states she uses a wheelchair fairly often at home 3/24; both wound areas have thick callus. However things actually look quite a bit better here for the majority of her left foot and the right foot. 3/31; patient continues to have thick callused somewhat irritated looking tissue around the wounds which individually are fairly superficial. There is no evidence of surrounding infection. We have been using silver alginate however I change that to [facility] today 4/17; patient returns to clinic after having a scare with Covid she tested negative in her primary doctor's office. She has been using Hydrofera Blue. She does not have an open area on the right foot. On the left foot she has a small open area with the mid area not completely viable. She showed me pictures of what looks like a hemorrhagic blister from several days ago but that seems to have healed over this was on the lateral left foot 4/21; patient comes in to clinic with both her wounds on her feet closed. However over the weekend she started having pain in her right foot and leg up into the thigh. She felt as though she was running a low-grade fever but did not take her temperature. She took a doxycycline that she had leftover and yesterday a single Septra  and metronidazole. She thinks things feel somewhat  better. 4/28; duplex ultrasound I ordered last week was negative for DVT or superficial thrombophlebitis. She is completed the doxycycline I gave her. States she is still having a lot of pain in the right calf and right ankle which is no better than last week. She cannot sleep. She also states she has a temperature of up to 101, coughing and complaining of visual loss in her bilateral eyes. Apparently she was tested for Covid 2 weeks ago at [facility] and that was negative. Readmission: 09/03/18 patient presents back for reevaluation after having been evaluated at the end of April regarding erythema and swelling of her right lower extremity. Subsequently she ended up going to the hospital on 07/29/18 and was admitted not to be discharged until 08/08/18. Unfortunately it was noted during the time that she was in the hospital that she did have methicillin-resistant Staphylococcus aureus as the infection noted at the site. It was also determined that she did have osteomyelitis which appears to be fairly significant. She was treated with vancomycin and in fact is still on IV vancomycin at dialysis currently. This is actually slated to continue until 09/12/18 at least which will be the completion of the six weeks of therapy. Nonetheless based on what I'm seeing at this point I'm not sure she will be anywhere near ready to discontinue antibiotics at that time. Since she was released from the hospital she was seen by [name] who is her podiatrist on 08/27/18. His note specifically states that he is recommended that the patient needs of one knee amputation on the right as she has a life-threatening situation that can lead quickly to sepsis. The patient advised she would like to try to save her leg to which [name] apparently told her that this was against all medical advice. She also want to discontinue the Wound VAC which had been initiated due to the fact that she wasn't pleased with how the wound was looking  and subsequently she wanted to pursue applying Medihoney at that time. He stated that he did not believe that the right lower extremity was salvageable and that the patient understood but would still like to attempt hyperbaric option therapy if it could be of any benefit. She was therefore referred back to [place] for further evaluation. He plans to see her back next week. Upon inspection today patient has a significant amount purulent drainage noted from the wound at this point. The bone in the distal portion of her foot also appears to be extremely necrotic and spongy. When I push down on the bone it bubbles and seeps purulent drainage from deeper in the end of the foot. I do not think that this is likely going to heal very well at all and less aggressive surgical debridement were undertaken more than what I believe we can likely do here in our office. 09/12/2018; I have not seen this patient since the most recent hospitalization although she was in our clinic last week. I have reviewed some of her records from a complex hospitalization. 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 There is situation was complicated by MRSA bacteremia and acute on chronic renal failure now on dialysis. She is receiving vancomycin at dialysis. We started her on Dakin's wet-to- dry last week she is changing this daily. There is still purulent drainage coming out of her foot. Although she is apparently "agreeable" to a below-knee amputation which  is been suggested by multiple clinicians she wants this to be done in [place]. She apparently has a telehealth visit with that provider sometime in late [place] 6/24. I have told her I think this is probably too long. Nevertheless I could not convince her to allow a local doctor to perform BKA. 09/19/2018; the patient has a large necrotic area on the right anterior foot. She has had previous transmetatarsal amputations. Culture I did  last week showed MRSA nothing else she is on vancomycin at dialysis. She has continued leaking purulent drainage out of the distal part of the large circular wound on the right anterior foot. She apparently went to see Dr. 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 Somehow that translated into her being referred to plastic surgery for debridement of the area. I gather she basically refused amputation although I do not have a copy of Dr. Berenice Primas notes. The patient really wants to have a trial of hyperbaric oxygen. I agreed with initial assessment in this clinic that this was probably too far along to benefit however if she is going to have plastic surgery I think she would benefit from ancillary hyperbaric oxygen. The issue here is that the patient has benefited as maximally as any patient I have ever seen from hyperbaric oxygen therapy. Most recently she had exposed bone on the lateral part of her left foot after a surgical procedure and that actually has closed. She has eschared areas in both heels but no open area. She is remained systemically well. I am not optimistic that anything can be done about this but the patient is very clear that she wants an attempt. The attempt would include a wound VAC further debridements and hyperbaric oxygen along with IV antibiotics. 6/26; I put her in for a trial of hyperbaric oxygen only because of the dramatic response she has had with wounds on her left midfoot earlier this year which was a surgical wound that went straight to her bone over the metatarsal heads and also remotely the left third toe. We will see if we can get this through our review process and insurance. She arrives in clinic with again purulent material pouring out of necrotic bone on the top of the foot distally. There is also some concerning erythema on the front of the leg that we marked. It is bit difficult to tell how tender this is because of neuropathy. I  note from infectious disease that she had her vancomycin extended. 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 She had the wound VAC on for part of the week. The rest of the time she is putting various things on this including Medihoney, "ionized water" silver sorb gel etc. 7/7; follow-up along with HBO. She is still on vancomycin at dialysis. She has a large open area on the dorsal right foot and a small dark eschar area on her heel. There is a lot less erythema in the area and a lot less tenderness. From an infection point of view I think this is better. She still has a lot of necrosis in the remaining right forefoot [previous TMA] we are still using the wound VAC in this area 7/16; follow-up along with HBO. I put her on linezolid after she finished her vancomycin. We started this last Friday I gave her 2 weeks worth. I had the expectation that she would be operatively debrided by Dr. Marla Roe but that still has not happened yet. Patient phoned the  office this week. She arrives for review today after HBO. The distal part of this wound is completely necrotic. Nonviable pieces of tendon bone was still purulent drainage. Also concerning that she has black eschar over the heel that is expanding. I think this may be indicative of infection in this area as well. She has less erythema and warmth in the ankle and calf but still an abnormal exam 7/21 follow-up along with HBO. I will renew her linezolid after checking a CBC with differential monitoring her blood counts especially her platelets. She was supposed to have surgery yesterday but if I am reading things correctly this was canceled after her blood sugar was found to be over 500. I thought Dr. Marla Roe who called me said that they were sending her to the ER but the patient states that was not the case. 7/28. Follow-up along with HBO. She is on linezolid I still do not have any lab work from dialysis even though  I called last week. The patient is concerned about an area on her left lateral foot about the level of the base of her fifth metatarsal. I did not really see anything that ominous here however this patient is in [place] ability to point out problems that she is sensing and she has been accurate in the past Finally she received a call from Dr. Marla Roe who is referring her to another orthopedic surgeon stating that she is too booked up to take her to the operating room now. Was still using a wound VAC on the foot 8/3 -Follow-up after HBO, she is got another week of linezolid, she is to call ID for an appointment, x-rays of both feet were reviewed, the left foot x-ray with third MTP joint osteo- Right foot x-ray widespread osteo-in the right midfoot Right ankle x-ray does not show any active evidence of infection 8/11-Patient is seen after HBO, the wounds on the right foot appear to be about the same, the heel wound had some necrotic base over tendon that was debrided with a curette 8/21; patient is seen after HBO. The patient's wound on her dorsal foot actually looks reasonably good and there is substantial amount of epithelialization however the open area distally still has a lot of necrotic debris partially bone. I cannot really get a good sense of just how deep this probes under the foot. She has been pressuring me this week to order medical maggots through a company in [place] for her. The problem I have is there is not a defined wound area here. On the positive side there is no purulence. She has been to see infectious disease she is still on Septra DS although I have not had a chance to review their notes 8/28; patient is seen in conjunction with HBO. The wounds on her foot continued to improve including the right dorsal foot substantially the, the distal part of this wound and the area on the right heel. We have been using a wound VAC over this chronically. She is still on trimethoprim  as directed by infectious disease 9/4; patient is seen in conjunction with HBO. Right dorsal foot wound substantially anteriorly is better however she continues to have a deep wound in the distal part of this that is not responding. We have been using silver collagen under border foam Area on the right plantar medial heel seems better. We have been using Hydrofera Blue 12/12/18 on evaluation today patient appears to be doing about the same with regard to her wound based  on prior measurements. She does have some necrotic tissue noted on the lateral aspect of the wound that is going require a little bit of sharp debridement today. This includes what appears to be potentially either severely necrotic bone or tendon. Nonetheless other than that she does not appear to have any severe infection which is good news 9/18; it is been 2 weeks since I saw this wound. She is tolerating HBO well. Continued dramatic improvement in the area on the right dorsal foot. She still has a small wound on the heel that we have been using Hydrofera Blue. She continues with a wound VAC 9/24; patient has to be seen emergently today with a swelling on her right lateral lower leg. She says that she told Dr. Evette Doffing about this and also myself on a couple of occasions but I really have no recollection of this. She is not systemically unwell and her wound really looked good the last time I saw this. She showed this to providers at dialysis and she was able to verify that she was started on cephalexin today for 5 doses at dialysis. She dialyzes on Tuesday Thursday and Saturday. 10/2; patient is seen in conjunction with HBO. The area that is draining on the right anterior medial tibia is more extensive. Copious amounts of serosanguineous drainage with some purulence. We are still using the wound VAC on the original wound then it is stable. Culture I did of the original IandD showed MRSA I contacted dialysis she is now on vancomycin  with dialysis treatments. I asked them to run a month 10/9; patient seen in conjunction with HBO. She had a new spontaneous open area just above the wound on the right medial tibia ankle. More swelling on the right medial tibia. Her wound on the foot looks about the same perhaps slightly better. There is no warmth spreading up her leg but no obvious erythema. her MRI of the foot and ankle and distal tib-fib is not booked for next Friday I discussed this with her in great detail over multiple days. it is likely she has spreading infection upper leg at least involving the distal 25% above the ankle. She knows that if I refer her to orthopedics for infectious disease they are going to recommend amputation and indeed I am not against this myself. We had a good trial at trying to heal the foot which is what she wanted along with antibiotics debridement and HBO however she clearly has spreading infection [probably staph aureus/MRSA]. Nevertheless she once again tells me she wants to wait the left of the MRI. She still makes comments about having her amputation done in [place]. 10/19; arrives today with significant swelling on the lateral right leg. Last culture I did showed Klebsiella. Multidrug- resistant. Cipro was intermediate sensitivity and that is what I have her on pending her MRI which apparently is going to be done on Thursday this week although this seems to be moving back and forth. She is not systemically unwell. We are using silver alginate on her major wound area on the right medial foot and the draining areas on the right lateral lower leg 10/26; MRI showed extensive abscess in the anterior compartment of the right leg also widespread osteomyelitis involving osseous structures of the midfoot and portions of the hindfoot. Also suspicion for osteomyelitis anterior aspect of the distal medial malleolus. Culture I did of the purulence once again showed a multidrug-resistant Klebsiella. I  have been in contact with nephrology late last week and  she has been started on cefepime at dialysis to replace the vancomycin We sent a copy of her MRI report to Dr. Geroge Baseman in [place] who is an orthopedic surgeon. The patient takes great stock in his opinion on this. She says she will go to [place] to have her leg amputated if Dr. Geroge Baseman does not feel there is any salvage options. Electronic Signature(s) Signed: 01/26/2019 5:48:14 PM By: Linton Ham MD Entered By: Linton Ham on 01/26/2019 15:22:09 -------------------------------------------------------------------------------- Physical Exam Details Patient Name: Date of Service: [name] 01/26/2019 1:30 PM Medical Record Number:[id number] Patient Account Number: [id number] Date of Birth/Sex: Treating RN: 1970/09/02 (48 y.o. Nancy Fetter Primary Care Provider: [name] Other Clinician: Referring Provider: Treating Provider/Extender:Lathaniel Legate, [name], [name] in Treatment: 20 Constitutional Sitting or standing Blood Pressure is within target range for patient.. Pulse regular and within target range for patient.Marland Kitchen Respirations regular, non-labored and within target range.. Patient is febrile today. Mild fever at 99.2. Appears in no distress. Respiratory work of breathing is normal. Cardiovascular Pedal pulses palpable on the right. This is not her problem. Integumentary (Hair, Skin) Erythema on the right anterior tibial area. Psychiatric appears at normal baseline. Notes Wound exam; the original wound on the medial part of her remaining foot on the right actually does not look too bad. The problem is on the anterior tibial area with purulent drainage coming out of the 2 minor incisions I made in the lower leg. Patient states she is draining about an ounce and a half of pus out of these a day. There is erythema here but no tenderness because of her neuropathy Electronic  Signature(s) Signed: 01/26/2019 5:48:14 PM By: Linton Ham MD Entered By: Linton Ham on 01/26/2019 15:23:34 -------------------------------------------------------------------------------- Physician Orders Details Patient Name: Date of Service: [name] 01/26/2019 1:30 PM Medical Record Number:[id number] Patient Account Number: [id number] Date of Birth/Sex: Treating RN: April 04, 1970 (48 y.o. Nancy Fetter Primary Care Provider: [name] Other Clinician: Referring Provider: Treating Provider/Extender:Wilkin Lippy, [name], [name] in Treatment: 20 Verbal / Phone Orders: No Diagnosis Coding ICD-10 Coding Code Description [phone] Other chronic osteomyelitis, right ankle and foot L97.514 Non-pressure chronic ulcer of other part of right foot with necrosis of bone E10.621 Type 1 diabetes mellitus with foot ulcer L02.415 Cutaneous abscess of right lower limb Follow-up Appointments Return Appointment in 1 week. Dressing Change Frequency Wound #43 Right,Medial Foot Change dressing three times week. Wound #44 Right Calcaneus Change dressing three times week. Wound #46 Right,Lateral Lower Leg Change dressing three times week. Wound Cleansing Wound #43 Right,Medial Foot Clean wound with Wound Cleanser - anasept Wound #44 Right Calcaneus Clean wound with Wound Cleanser - anasept Wound #46 Right,Lateral Lower Leg Clean wound with Wound Cleanser - anasept Primary Wound Dressing Wound #43 Right,Medial Foot Calcium Alginate with Silver Wound #44 Right Calcaneus Calcium Alginate with Silver Wound #46 Right,Lateral Lower Leg Calcium Alginate with Silver - do not pack, place over wound bed Secondary Dressing Wound #43 Right,Medial Foot Kerlix/Rolled [name] ABD pad Wound #44 Right Calcaneus Kerlix/Rolled [name] Dry [name] Heel Cup Wound #46 Right,Lateral Lower Leg Kerlix/Rolled [name] ABD pad Edema Control Elevate legs to the level of the heart or  above for 30 minutes daily and/or when sitting, a frequency of: - throughout the day [place] skilled nursing for wound care. - Interim Electronic Signature(s) Signed: 01/26/2019 5:48:14 PM By: Linton Ham MD Signed: 01/26/2019 6:04:10 PM By: [name] RN, BSN Entered By: [name] on 01/26/2019 15:04:22 --------------------------------------------------------------------------------  Problem List Details Patient Name: Date of Service: VENERA, PRIVOTT 01/26/2019 1:30 PM Medical Record Number:[id number] Patient Account Number: [id number] Date of Birth/Sex: Treating RN: 03/30/1971 (48 y.o. Nancy Fetter Primary Care Provider: [name] Other Clinician: Referring Provider: Treating Provider/Extender:Orvella Digiulio, [name], [name] in Treatment: 20 Active Problems ICD-10 Evaluated Encounter Code Description Active Date Today Diagnosis M86.671 Other chronic osteomyelitis, right ankle and foot 09/03/2018 No Yes L97.514 Non-pressure chronic ulcer of other part of right foot 09/03/2018 No Yes with necrosis of bone E10.621 Type 1 diabetes mellitus with foot ulcer 09/24/2018 No Yes L02.415 Cutaneous abscess of right lower limb 12/25/2018 No Yes Inactive Problems Resolved Problems Electronic Signature(s) Signed: 01/26/2019 5:48:14 PM By: Linton Ham MD Entered By: Linton Ham on 01/26/2019 15:18:51 -------------------------------------------------------------------------------- Progress Note Details Patient Name: Date of Service: [name] 01/26/2019 1:30 PM Medical Record Number:[id number] Patient Account Number: [id number] Date of Birth/Sex: Treating RN: 19-Nov-1970 (48 y.o. Nancy Fetter Primary Care Provider: [name] Other Clinician: Referring Provider: Treating Provider/Extender:Leaha Cuervo, [name], [name] in Treatment: 20 Subjective History of Present Illness (HPI) 48 year old diabetic who  is known to have type 1 diabetes which is poorly controlled last hemoglobin A1c was 11%. She comes in with a ulcerated area on the left lateral foot which has been there for over 6 months. Was recently she has been treated by [name] of podiatry who saw her last on 05/28/2016. Review of his notes revealed that the patient had incision and drainage with placement of antibiotic beads to the left foot on 04/11/2016 for possible osteomyelitis of the cuboid bone. Over the last year she's had a history of amputation of the left fifth toe and a femoropopliteal popliteal bypass graft somewhere in April 2017. 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 His note [name] mentions that the patient has been referred to me for further wound care and possibly great candidate for hyperbaric oxygen therapy due to recurrent osteomyelitis. However we do not have any x-rays of biopsy reports confirming this. He has been on several antibiotics including Bactrim and most recently is on doxycycline for an MRSA. I understand, the patient was not a candidate for IV antibiotics as she has had previous PICC lines which resulted in blood clots in both arms. There was a x-ray report dated 04/04/2016 on [name] notes which showed evidence of fifth ray resection left foot with osteolytic changes noted to the fourth metatarsal and cuboid bone on the left. 06/13/2016 -- had a left foot x-ray which showed no acute fracture or dislocation and no definite radiographic evidence of osteomyelitis. Advanced osteopenia was seen. 06/20/2016 -- she has noticed a new wound on the right plantar foot in the region where she had a callus before. 06/27/16- the patient did have her x-ray of the right foot which showed no findings to suggest osteomyelitis. She saw her endocrinologist, [name], yesterday. Her A1c in January was 11. He also indicates mismanagement and noncompliance regarding her diabetes. She is currently on  Bactrim for a lip infection. She is complaining of nausea, vomiting and diarrhea. She is unable to articulate the exact orders or dosing of the Bactrim; it is unclear when she will complete this. 07/04/2016 -- results from [facility] of ABIs with ankle waveforms were noted from 02/14/2016. The examination done on 06/27/2015 showed noncompressible ABIs with the right being 1.45 and the left being 1.33. The present examination showed a right ABI of 1.19 on the left of 1.33. The  conclusion was that right normal ABI in the lower extremity at rest however compared to previous study which was noncompressible ABI may be falsely elevated side suggesting medial calcification. The left ABI suggested medial calcification. 08/01/2016 -- the patient had more redness and pain on her right foot and did not get to come to see as noted she see her PCP or go to the ER and decided to take some leftover metronidazole which she had at home. As usual, the patient does report she feels and is rather noncompliant. 08/08/2016 -- -- x-ray of the right foot -- FINDINGS:Transmetatarsal amputation is noted. No bony destruction is noted to suggest osteomyelitis. IMPRESSION: No evidence of osteomyelitis. Postsurgical changes are seen. MRI would be more sensitive for possible bony changes. Culture has grown Serratia Marcescens -- sensitive to Bactrim, ciprofloxacin, ceftazidime she was seen by [name] on 08/06/2016. He did not find any exposed bone, muscle, tendon, ligament or joint. There was no malodor and he did a excisional debridement in the office. ============ Old notes: 48 year old patient who is known to the wound clinic for a while had been away from the wound clinic since 09/01/2014. Over the last several months she has been admitted to various hospitals including [facility]. She was treated for a right metatarsal osteomyelitis with a transmetatarsal amputation and this was done about 2  months ago. He has a small ulcerated area on the right heel and she continues to have an ulcerated area on the left plantar aspect of the foot. The patient was recently admitted to the [facility] hospital group between 7/12 and 10/18/2014. she was given 3 weeks of IV vancomycin and was to follow-up with her surgeons at [facility] and also took oral vancomycin for C. difficile colitis. 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 MRI of the left foot done on 10/14/2014 showed no abscess or osteomyelitis. 04/27/15; this is a patient we know from previous stays in the wound care center. She is a type I diabetic I am not sure of her control currently. Since the last time I saw her she is had a right transmetatarsal amputation and has no wounds on her right foot and has no open wounds. She is been followed at the wound care center at [facility] in [place]. She comes today with the desire to undergo hyperbaric treatment locally. Apparently one of her wound care providers in [place] has suggested hyperbarics. This is in response to an MRI from 04/18/15 that showed increased marrow signal and loss of the proximal fifth metatarsal cortex evidence of osteomyelitis with likely early osteomyelitis in the cuboid bone as well. She has a large wound over the base of the fifth metatarsal. She also has a eschar over her the tips of her toes on 1,3 and 5. She does not have peripheral pulses and apparently is going for an angiogram tomorrow which seems reasonable. After this she is going to infectious disease at [facility]. They have been using Medihoney to the large wound on the lateral aspect of the left foot to. The patient  has known Charcot deformity from diabetic neuropathy. She also has known diabetic PAD. Surprisingly I can't see that she has had any recent antibiotics, the patient states the last antibiotic she had was at  the end of November for 10 days. I think this was in response to culture that showed group G strep although I'm not exactly sure where the culture was from. She is also had arterial studies on 03/29/15. This showed a right ABI of 1.4 that was noncompressible. Her left ABI was 0.73. 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 It was not felt that arterial inflow was adequate for healing of a foot ulcer. Her Doppler waveforms looked monophasic ===== READMISSION 02/28/17; this is in an now 48 year old woman we've had at several different occasions in this clinic. 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 She has a remote ex-smoker. She was last seen in this clinic by Dr. Con Memos I think in May. More recently she is been followed by her podiatrist [name] an infectious disease Dr. Megan Salon. She has 2 open wounds the major one is over the right first metatarsal head she also has a wound on the left plantar foot. an MRI of the right foot on 01/01/17 showed a soft tissue ulcer along the plantar aspect of the first metatarsal base consistent with osteomyelitis of the first metatarsal stump. Dr. 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 According to the patient this is been open for slightly over a month. She has been on a combination of Cipro 500 twice a day, Zyvox 600 twice a day and Flagyl 500 3 times a day for over a month now as directed by Dr. Megan Salon. cultures of the right foot earlier this year showed MRSA in January and Serratia in May. January also had a few viridans strep. Recent x-rays of both feet were done and [name] office and I don't have these  reports. The patient has known PAD and has a history of aleft femoropopliteal bypass in April 2017. 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 Dwyane Dee. In spite of this the last hemoglobin A1c I can see is 10.1 on 01/01/2017. She is being referred by 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 She is been using Medihoney to this area and also an area on the left midfoot. She is using healing sandals bilaterally. ABIs in this clinic at the left posterior tibial was 1.1 noncompressible on the right READMISSION Non invasive vascular NOVANT 5/18 Aftercare following surgery of the circulatory system Procedure Note - Interface, External Ris In - 08/13/2016 11:05 AM EDT Procedure: Examination consists of physiologic resting arterial pressures of the brachial and ankle arteries bilaterally with continuous wave Doppler waveform analysis. Previous: Previous exam performed on 02/14/16 demonstrated ABIs of Rt = 1.19 and Lt = 1.33. Right: ABI = non-compressible PT, 1.47 DP. S/P transmet amputation. Left: ABI = 1.52, 2nd digit pressure = 87 mmHg Conclusions: Right: ABI (>1.3) may be falsely elevated, suggesting medial calcification. Left: ABI (>1.3) may be falsely elevated, suggesting medial calcification The patient is a now 48 year old type I diabetic is had multiple issues her graded to chronic diabetic foot ulcers. 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 We had her in the clinic lastin November. At that point she had wounds on her bilateral feet.she had wanted  to try hyperbarics however the healogics review process denied her because she hadn't followed up with her vascular  surgeon for her left femoropopliteal bypass. The bypass was done by Dr. Raul Del at [facility]. We made her a follow-up with Dr. Raul Del however she did not keep the appointment and therefore she was not approved The patient shows me a small wound on her left fourth metatarsal head on her phone. She developed rapid discoloration in the plantar aspect of the left foot and she was admitted to hospital from 2/2 through 05/10/17 with wet gangrene of the left foot osteomyelitis of the fourth metatarsal heads. She was admitted acutely ill with a temperature of 103. She was started on broad-spectrum vancomycin and cefepime. On 05/06/17 she was taken to the OR by [name] her podiatric surgeon for an incision and drainage irrigation of the left foot wound. Cultures from this surgery revealed group be strep and anaerobes. she was seen by Dr.Xu of orthopedic surgery and scheduled for a below-knee amputation which she u refused. Ultimately she was discharged on Levaquin and Flagyl for one month. MRI 05/05/17 done while she was in the hospital showed abscess adjacent to the fourth metatarsal head and neck small abscess around the fourth flexor tendon. Inflammatory phlegmon and gas in the soft tissues along the lateral aspect of the fourth phalanx. Findings worrisome for osteomyelitis involving the fourth proximal and middle phalanx and also the third and fourth metatarsals. Finally the patient had actually shortly before this followed up with Dr. Raul Del at no time on 04/29/17. He felt that her left femoropopliteal bypass was patent he felt that her left-sided toe pressures more than adequate for healing a wound on the left foot. This was before her acute presentation. 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 05/28/17; she is started hyperbarics. The patient tells me that for some reason she was not actually on Levaquin but I think on ciprofloxacin. She was on Flagyl. She only started her Levaquin yesterday due  to some difficulty with the pharmacy and perhaps her sister picking it up. She has an appointment with [name] tomorrow and with infectious disease early next week. She has no new complaints 06/06/17; the patient continues in hyperbarics. She saw [name] on 05/29/17 who is her podiatric surgeon. He is elected for a transmetatarsal amputation on 06/27/17. I'm not sure at what level he plans to do this amputation. The patient is unaware ooShe also saw Dr. Megan Salon of infectious disease who elected to continue her on current antibiotics I think this is ciprofloxacin and Flagyl. I'll need to clarify with her tomorrow if she actually has this. We're using silver alginate to the actual wound. Necrotic surface today with material under the flap of her foot. ooOriginal MRI showed abscesses as well as osteomyelitis of the proximal and middle fourth phalanx and the third and fourth metatarsal heads 06/11/17; patient continues in hyperbarics and continues on oral antibiotics. She is doing well. The wound looks better. The necrotic part of this under the flap in her superior foot also looks better. she is been to see [name]. I haven't had a chance to look at his note. Apparently he has put the transmetatarsal amputation on hold her request it is still planning to take her to the OR for debridement and product application ACEL. I'll see if I can find his note. I'll therefore leave product ordering/requests to [name] for now. I was going to look at Dermagraft 06/18/17-she is here in follow-up evaluation  for bilateral foot wounds. She continues with hyperbaric therapy. She states she has been applying manuka honey to the right plantar foot and alternate manuka honey and silver alginate to the left foot, despite our orders. We will continue with same treatment plan and she will follo up next week. 06/25/17; I have reviewed [name] last note from 3/11. She has operative debridement in 2 days' time. By  review his note apparently they're going to place there is skin over the majority of this wound which is a good choice. She has a small satellite area at the most proximal part of this wound on the left plantar foot. The area on the right plantar foot we've been using silver alginate and it is close to healing. 07/02/17; unfortunately the patient was not easily approved for [name] proposed surgery. I'm not completely certain what the issue is. She has been using silver alginate to the wound she has completed a first course of hyperbarics. She is still on Levaquin and Flagyl. I have really lost track of the time course here.I suspect she should have another week to 2 of antibiotics. I'll need to see if she is followed up with infectious disease Dr. Megan Salon 07/09/17; the patient is followed up with Dr. Megan Salon. 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 She continues on Levaquin and metronidazole continuing both of these for now I think she is been on fr about 6 weeks. She still has some drainage but no pain. No fever. Her had been plans for her to go to the OR for operative debridement with her podiatrist [name], I am not exactly sure where that is. I'll probably slip a note to [name] today. I note that she follows with Dr. Dwyane Dee of endocrinology. We have her recertified for hyperbaric oxygen. I have not heard about Dermagraft however I'll see if [name] is planning a skin substitute as well 07/16/17; the patient tells me she is just about out of Heath. I'll need to check Dr. Hale Bogus last notes on this. She states she has plenty of Flagyl however. She comes in today complaining of pain in the right lateral foot which she said lasted for about a day. The wound on the right foot is actually much more medially. She also tells me that the [facility] cost a lot of pain in the left foot wound and she turned back to silver alginate. Finally  Dermagraft has a $299 per application co-pay. She cannot afford this 07/23/17; patient arrives today with the wound not much smaller. There is not much new to add. She has not heard from [name] all try to put in a call to them today. She was asking about Dermagraft again and she has an over $371 per application co-pay she states that she would be willing to try to do a payment plan. I been tried to avoid this. We've been using silver alginate, I'll change to [facility] 07/30/17-She is here in follow-up evaluation for left foot ulcer. She continues hyperbaric medicine. The left foot ulcer is stable we will continue with same treatment plan 08/06/17; she is here for evaluation of her left foot ulcer. Currently being treated for hyperbarics or underlying osteomyelitis. She is completed antibiotics. The left foot ulcer is better smaller with healthier looking granulation. For various reasons I am not really clear on we never got her back to the OR with [name]. He did not respond  to my secure text message. Nevertheless I think that surgery on this point is not necessary nor am I completely clear that a skin substitute is necessary The patient is complaining about pain on the outside of her right foot. 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 There is no erythema. She also states the foot is warm versus her other part of her upper leg and this is largely true. It is not totally clear to me what's causing this. She thinks it's different from her usual neuropathy pain 08/13/17; she arrives in clinic today with a small wound which is superficial on her right first metatarsal head. 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 She tells [place] she was up on her feet over the Mother's Day celebration. ooThe large wound is on the left foot. Continues with hyperbarics for underlying osteomyelitis. We're using Hydrofera Blue. She asked me today about where we were with  Dermagraft. I had actually excluded this because of the co- pay however she wants to assume this therefore I'll recheck the co-pay an order for next week. 08/20/17; the patient agreed to accept the co-pay of the first Apligraf which we applied today. She is disappointed she is finishing hyperbarics will run this through the insurance on the extent of the foot infection and the extent of the wound that she had however she is already had 60 dive's. Dermagraft No. 1 08/27/17; Dermagraft No. 2. She is not eligible for any more hyperbaric treatments this month. She reports a fair amount of drainage and she actually changed to the external dressings without disturbing the direct contact layer 09/03/17; the patient arrived in clinic today with the wound superficially looking quite healthy. Nice vibrant red tissue with some advancing epithelialization although not as much adherence of the flap as I might like. However she noted on her own fourth toe some bogginess and she brought that to our attention. Indeed this was boggy feeling like a possibility of subcutaneous fluid. 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 She is not been unwell. We've been using Dermagraft 09/10/17; the culture that I did not last week was MRSA. She saw Dr. Megan Salon this morning who is going to start her on vancomycin. I had sent him a secure a text message yesterday. I also spoke with her podiatric surgeon [name] about surgery on this foot the options for conserving a functional foot etc. Promised me he would see her and will make back consultation today. Paradoxically her actual wound on the plantar aspect of her left foot looks really quite good. I had given her 5 days worth of Baxdella to cover her for MRSA. Her MRI came back showing osteomyelitis within the third metatarsal shaft and head and base of the third and fourth proximal phalanx. She had  extensive inflammatory changes throughout the soft tissue of the lateral forefoot. With an ill-defined fluid around the fourth metatarsal extending into the plantar and dorsal soft tissues 09/19/17; the patient is actually on oral Septra and Flagyl. She apparently refused IV vancomycin. She also saw [name] at my request who is planning her for a left BKA sometime in mid July. MRI showed osteomyelitis within the third metatarsal shaft and head and the basis of the third and fourth proximal phalanx. I believe there was felt to be possible septic arthritis involving the third MTP. 09/26/17; the patient went back to Dr. Megan Salon  at my suggestion and is now receiving IV daptomycin. Her wound continues to look quite good making the decision to proceed with a transmetatarsal amputation although more difficult for the patient. I believe in my extensive discussions with her she has a good sense of the pros and cons of this. I don't NV the tuft decision she has to make. She has an appointment with [name] I believe in mid July and I previously spoken to him about this issue Has we had used 3 previous Dermagraft. Given the condition of the wound surface I went ahead and added the fourth one today area and I did this not fully realizing that she'll be traveling to [place] next week. I'm hopeful she can come back in 2 weeks 10/21/17; Her same Dermagraft on for about 3-1/2 weeks. In spite of this the wound arrives looking quite healthy. There is been a lot of healing dimensions are smaller. Looking at the square shaped wound she has now there is some undermining and some depth medially under the undermining although I cannot palpate any bone. No surrounding infection is obvious. She has difficult questions about how to look at this going forward vis--vis amputations versus continued medical therapy. To be truthful the wound is looks so healthy and it is continued to contract. Hard to justify foot surgery  at this point although I still told her that I think it might come to that if we are not able to eradicate the underlying MRSA. She is still highly at risk and she understands this 11/06/17 on evaluation today patient appears to be doing better in regard to her foot ulcer. She's been tolerating the dressing changes without complication. Currently she is here for her Dermagraft #6. Her wound continues to make excellent progress at this point. She does not appear to have any evidence of infection which is good news. 11/13/17 on evaluation today patient appears to be doing excellent at this time. She is here for repeat Dermagraft application. This is #7. Overall her wound seems to be making great progress. 12/05/17; the patient arrives with the wound in much better condition than when I last saw this almost 6 weeks ago. She still has a small probing area in the left metatarsal head region on the lateral aspect of her foot. We applied her last Dermagraft today. ooSince the last time she is here she has what appears to a been a blood blister on the plantar aspect of left foot although I don't see this is threatening. There is also a thick raised tissue on the right mid metatarsal head region. This was not there I don't think the last time she was here 3 weeks ago. 12/12/17; the patient continues to have a small programming area in the left metatarsal head region on the lateral aspect of her foot which was the initial large surgical wound. I applied her last Apligraf last week. I'm going to use Endoform starting today ooUnfortunately she has an excoriated area in the left mid foot and the right mid foot. The left midfoot looks like a blistered area this was not opened last week it certainly is open today. Using silver alginate on these areas. She promises me she is offloading this. 12/19/17; the small probing area in the left metatarsal head eyes think is shallower. In general her original wound looks  better. We've been using Endoform. The area inferiorly that I think was trauma last week still requires debridement a lot of nonviable surface which I removed.  She still has an open open area distally in her foot ooSimilarly on the right foot there is tightly adherent surface debris which I removed. Still areas that don't look completely epithelialized. This is a small open area. We used silver alginate on these areas 12/26/2017; the patient did not have the supplies we ordered from last week including the Endoform. The original large wound on the left lateral foot looks healthy. She still has the undermining area that is largely unchanged from last week. She has the same heavily callused raised edged wounds on the right mid and left midfoot. Both of these requiring debridement. We have been using silver alginate on these areas 01/02/2018; there is still supply issues. We are going to try to use Prisma but I am not sure she actually got it from what she is saying. She has a new open area on the lateral aspect of the left fourth toe [previous fifth ray amputation]. Still the one tunneling area over the fourth metatarsal head. The area is in the midfoot bilaterally still have thick callus around them. She is concerned about a raised swelling on the lateral aspect of the foot. However she is completely insensate 01/10/2018; we are using Prisma to the wounds on her bilateral feet. Surprisingly the tunneling area over the left fourth metatarsal head that was part of her original surgery has closed down. She has a small open area remaining on the incision line. 2 open areas in the midfoot. 02/10/2018; the patient arrives back in clinic after a month hiatus. She was traveling to visit family in [place]. Is fairly clear she was not offloading the areas on her feet. The original wound over the left lateral foot at the level of metatarsal heads is reopened and probes medially by about a centimeter or 2. She  notes that a week ago she had purulent drainage come out of an area on the left midfoot. Paradoxically the worst area is actually on the right foot is extensive with purulent drainage. We will use silver alginate today 02/17/2018; the patient has 3 wounds one over the left lateral foot. She still has a small area over the metatarsal heads which is the remnant of her original surgical wound. This has medial probing depth of roughly 1.4 cm somewhat better than last week. The area on the right foot is larger. We have been using silver alginate to all areas. The area on the right foot and left foot that we cultured last week showed both Klebsiella and Proteus. Both of these are quinolone sensitive. The patient put her's self on Bactrim and Flagyl that she had left hanging around from prior antibiotic usages. She was apparently on this last week when she arrived. I did not realize this. Unfortunately the Bactrim will not cover either 1 of these organisms. We will send in Cipro 500 twice daily for a week 03/04/2018; the patient has 2 wounds on the left foot one is the original wound which was a surgical wound for a deep DFU. At one point this had exposed bone. She still has an area over the fourth metatarsal head that probes about 1.4 cm although I think this is better than last week. I been using silver nitrate to try and promote tissue adherence and been using silver alginate here. ooShe also has an area in the left midfoot. This has some depth but a small linear wound. Still requiring debridement. ooOn the right midfoot is a circular wound. A lot of thick callus around  this area. ooWe have been using silver alginate to all wound areas ooShe is completed the ciprofloxacin I gave her 2 weeks ago. 03/11/2018; the patient continues to have 2 open areas on the left foot 1 of which was the original surgical wound for a deep DFU. Only a small probing area remains although this is not much different from  last week we have been using silver alginate. The other area is on the midfoot this is smaller linear but still with some depth. We have been using silver alginate here as well ooOn the right foot she has a small circular wound in the mid aspect. This is not much smaller than last time. We have been using silver alginate here as well 03/18/2018; she has 3 wounds on the left foot the original surgical wound, a very superficial wound in the mid aspect and then finally the area in the mid plantar foot. She arrives in today with a very concerning area in the wound in the mid plantar foot which is her most proximal wound. There is undermining here of roughly 1-1/2 cm superiorly. Serosanguineous drainage. She tells me she had some pain on for over the weekend that shot up her foot into her thigh and she tells me that she had a nodule in the groin area. ooShe has the single wound in the right foot. ooWe are using endoform to both wound areas 03/24/2018; the patient arrives with the original surgical wound in the area on the left midfoot about the same as last week. There is a collection of fluid under the surface of the skin extending from the surgical wound towards the midfoot although it does not reach the midfoot wound. The area on the right foot is about the same. Cultures from last week of the left midfoot wound showed abundant Klebsiella abundant Enterococcus faecalis and moderate methicillin resistant staph I gave her Levaquin but this would have only covered the Klebsiella. She will need linezolid 04/01/2018; she is taking linezolid but for the first few days only took 1 a day. I have advised her to finish this at twice daily dosing. In any case all of her wounds are a lot better especially on the left foot. The original surgical wound is closed. The area on the left midfoot considerably smaller. The area on the right foot also smaller. 04/08/2018; her original surgical wound/osteomyelitis on  the left foot remains closed. She has area on the left foot that is in the midfoot area but she had some streaking towards this. This is not connected with her original wound at least not visually. ooSmall wound on the right midfoot appears somewhat smaller. 04/15/18; both wounds looks better. Original wound is better left midfoot. Using silver alginate 1/21; patient states she uses saltwater soak in, stones or remove callus from around her wounds. She is also concerned about a blood blister she had on the left foot but it simply resolved on its own. We've been using silver alginate 1/28; the patient arrives today with the same streaking area from her metatarsals laterally [the site of her original surgical wound] down to the middle of her foot. There is some drainage in the subcutaneous area here. This concerns me that there is actually continued ongoing infection in the metatarsals probably the fourth and third. This fixates an MRI of the foot without contrast [chronic renal failure] ooThe wound in the mid part of the foot is small but I wonder whether this area actually connects with the more  distal foot. ooThe area on the right midfoot is probably about the same. Callus thick skin around the small wound which I removed with a curette we have been using silver alginate on both wound areas 2/4; culture I did of the draining site on the left foot last time grew methicillin sensitive staph aureus. MRI of the left foot showed interval resolution of the findings surrounding the third metatarsal joint on the prior study consistent with treated osteomyelitis. Chronic soft tissue ulceration in the plantar and lateral aspect of the forefoot without residual focal fluid collection. No evidence of recurrent osteomyelitis. Noted to have the previous amputation of the distal first phalanx and fifth ray MRI of the right foot showed no evidence of osteomyelitis I am going to treat the patient with a  prolonged course of antibiotics directed against MSSA in the left foot 2/11; patient continues on cephalexin. She tells me she had nausea and vomiting over the weekend and missed 2 days. In general her foot looks much the same. She has a small open area just below the left fourth metatarsal head. A linear area in the left midfoot. Some discoloration extending from the inferior part of this into the left lateral foot although this appears to be superficial. She has a small area on the right midfoot which generally looks smaller after debridement 2/18; the patient is completing his cephalexin and has another 2 days. She continues to have open areas on the left and right foot. 2/25; she is now off antibiotics. The area on the left foot at the site of her original surgical wound has closed yet again. She still has open areas in the mid part of her foot however these appear smaller. The area on the right mid foot looks about the same. We have been using silver alginate She tells me she had a serious hypoglycemic spell at home. She had to have EMS called and get IV dextrose 3/3; disappointing on the left lateral foot large area of necrotic tissue surrounding the linear area. This appears to track up towards the same original surgical wound. Required extensive debridement. The area on the right plantar foot is not a lot better also using silver 3/12; the culture I did last time showed abundant enterococcus. I have prescribed Augmentin, should cover any unrecognized anaerobes as well. In addition there were a few MRSA and Serratia that would not be well covered although I did not want to give her multiple antibiotics. She comes in today with a new wound in the right midfoot this is not connected with the original wound over her MTP a lot of thick callus tissue around both wounds but once again she said she is not walking on these areas 3/17-Patient comes in for follow-up on the bilateral plantar wounds,  the right midfoot and the left plantar wound. Both these are heavily callused surrounding the wounds. We are continuing to use silver alginate, she is compliant with offloading and states she uses a wheelchair fairly often at home 3/24; both wound areas have thick callus. However things actually look quite a bit better here for the majority of her left foot and the right foot. 3/31; patient continues to have thick callused somewhat irritated looking tissue around the wounds which individually are fairly superficial. There is no evidence of surrounding infection. We have been using silver alginate however I change that to [facility] today 4/17; patient returns to clinic after having a scare with Covid she tested negative in her primary  doctor's office. She has been using Hydrofera Blue. She does not have an open area on the right foot. On the left foot she has a small open area with the mid area not completely viable. She showed me pictures of what looks like a hemorrhagic blister from several days ago but that seems to have healed over this was on the lateral left foot 4/21; patient comes in to clinic with both her wounds on her feet closed. However over the weekend she started having pain in her right foot and leg up into the thigh. She felt as though she was running a low-grade fever but did not take her temperature. She took a doxycycline that she had leftover and yesterday a single Septra and metronidazole. She thinks things feel somewhat better. 4/28; duplex ultrasound I ordered last week was negative for DVT or superficial thrombophlebitis. She is completed the doxycycline I gave her. States she is still having a lot of pain in the right calf and right ankle which is no better than last week. She cannot sleep. She also states she has a temperature of up to 101, coughing and complaining of visual loss in her bilateral eyes. Apparently she was tested for Covid 2 weeks ago at [facility] and  that was negative. Readmission: 09/03/18 patient presents back for reevaluation after having been evaluated at the end of April regarding erythema and swelling of her right lower extremity. Subsequently she ended up going to the hospital on 07/29/18 and was admitted not to be discharged until 08/08/18. Unfortunately it was noted during the time that she was in the hospital that she did have methicillin-resistant Staphylococcus aureus as the infection noted at the site. It was also determined that she did have osteomyelitis which appears to be fairly significant. She was treated with vancomycin and in fact is still on IV vancomycin at dialysis currently. This is actually slated to continue until 09/12/18 at least which will be the completion of the six weeks of therapy. Nonetheless based on what I'm seeing at this point I'm not sure she will be anywhere near ready to discontinue antibiotics at that time. Since she was released from the hospital she was seen by [name] who is her podiatrist on 08/27/18. His note specifically states that he is recommended that the patient needs of one knee amputation on the right as she has a life-threatening situation that can lead quickly to sepsis. The patient advised she would like to try to save her leg to which [name] apparently told her that this was against all medical advice. She also want to discontinue the Wound VAC which had been initiated due to the fact that she wasn't pleased with how the wound was looking and subsequently she wanted to pursue applying Medihoney at that time. He stated that he did not believe that the right lower extremity was salvageable and that the patient understood but would still like to attempt hyperbaric option therapy if it could be of any benefit. She was therefore referred back to [place] for further evaluation. He plans to see her back next week. Upon inspection today patient has a significant amount purulent drainage noted from  the wound at this point. The bone in the distal portion of her foot also appears to be extremely necrotic and spongy. When I push down on the bone it bubbles and seeps purulent drainage from deeper in the end of the foot. I do not think that this is likely going to heal very  well at all and less aggressive surgical debridement were undertaken more than what I believe we can likely do here in our office. 09/12/2018; I have not seen this patient since the most recent hospitalization although she was in our clinic last week. I have reviewed some of her records from a complex hospitalization. 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 There is situation was complicated by MRSA bacteremia and acute on chronic renal failure now on dialysis. She is receiving vancomycin at dialysis. We started her on Dakin's wet-to- dry last week she is changing this daily. There is still purulent drainage coming out of her foot. Although she is apparently "agreeable" to a below-knee amputation which is been suggested by multiple clinicians she wants this to be done in [place]. She apparently has a telehealth visit with that provider sometime in late [place] 6/24. I have told her I think this is probably too long. Nevertheless I could not convince her to allow a local doctor to perform BKA. 09/19/2018; the patient has a large necrotic area on the right anterior foot. She has had previous transmetatarsal amputations. Culture I did last week showed MRSA nothing else she is on vancomycin at dialysis. She has continued leaking purulent drainage out of the distal part of the large circular wound on the right anterior foot. She apparently went to see Dr. 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 Somehow that translated into her being referred to plastic surgery for debridement of the area. I gather she basically refused amputation although I do not  have a copy of Dr. Berenice Primas notes. The patient really wants to have a trial of hyperbaric oxygen. I agreed with initial assessment in this clinic that this was probably too far along to benefit however if she is going to have plastic surgery I think she would benefit from ancillary hyperbaric oxygen. The issue here is that the patient has benefited as maximally as any patient I have ever seen from hyperbaric oxygen therapy. Most recently she had exposed bone on the lateral part of her left foot after a surgical procedure and that actually has closed. She has eschared areas in both heels but no open area. She is remained systemically well. I am not optimistic that anything can be done about this but the patient is very clear that she wants an attempt. The attempt would include a wound VAC further debridements and hyperbaric oxygen along with IV antibiotics. 6/26; I put her in for a trial of hyperbaric oxygen only because of the dramatic response she has had with wounds on her left midfoot earlier this year which was a surgical wound that went straight to her bone over the metatarsal heads and also remotely the left third toe. We will see if we can get this through our review process and insurance. She arrives in clinic with again purulent material pouring out of necrotic bone on the top of the foot distally. There is also some concerning erythema on the front of the leg that we marked. It is bit difficult to tell how tender this is because of neuropathy. I note from infectious disease that she had her vancomycin extended. 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 She had the wound VAC on for part of the week. The rest of the time she is putting various things on this including Medihoney, "ionized water" silver sorb gel etc. 7/7; follow-up along with HBO.  She is still on vancomycin at dialysis. She has a large open area on the dorsal right foot and a small dark eschar  area on her heel. There is a lot less erythema in the area and a lot less tenderness. From an infection point of view I think this is better. She still has a lot of necrosis in the remaining right forefoot [previous TMA] we are still using the wound VAC in this area 7/16; follow-up along with HBO. I put her on linezolid after she finished her vancomycin. We started this last Friday I gave her 2 weeks worth. I had the expectation that she would be operatively debrided by Dr. Marla Roe but that still has not happened yet. Patient phoned the office this week. She arrives for review today after HBO. The distal part of this wound is completely necrotic. Nonviable pieces of tendon bone was still purulent drainage. Also concerning that she has black eschar over the heel that is expanding. I think this may be indicative of infection in this area as well. She has less erythema and warmth in the ankle and calf but still an abnormal exam 7/21 follow-up along with HBO. I will renew her linezolid after checking a CBC with differential monitoring her blood counts especially her platelets. She was supposed to have surgery yesterday but if I am reading things correctly this was canceled after her blood sugar was found to be over 500. I thought Dr. Marla Roe who called me said that they were sending her to the ER but the patient states that was not the case. 7/28. Follow-up along with HBO. She is on linezolid I still do not have any lab work from dialysis even though I called last week. The patient is concerned about an area on her left lateral foot about the level of the base of her fifth metatarsal. I did not really see anything that ominous here however this patient is in [place] ability to point out problems that she is sensing and she has been accurate in the past Finally she received a call from Dr. Marla Roe who is referring her to another orthopedic surgeon stating that she is too booked up to take her to  the operating room now. Was still using a wound VAC on the foot 8/3 -Follow-up after HBO, she is got another week of linezolid, she is to call ID for an appointment, x-rays of both feet were reviewed, the left foot x-ray with third MTP joint osteo- Right foot x-ray widespread osteo-in the right midfoot Right ankle x-ray does not show any active evidence of infection 8/11-Patient is seen after HBO, the wounds on the right foot appear to be about the same, the heel wound had some necrotic base over tendon that was debrided with a curette 8/21; patient is seen after HBO. The patient's wound on her dorsal foot actually looks reasonably good and there is substantial amount of epithelialization however the open area distally still has a lot of necrotic debris partially bone. I cannot really get a good sense of just how deep this probes under the foot. She has been pressuring me this week to order medical maggots through a company in [place] for her. The problem I have is there is not a defined wound area here. On the positive side there is no purulence. She has been to see infectious disease she is still on Septra DS although I have not had a chance to review their notes 8/28; patient is seen in conjunction with  HBO. The wounds on her foot continued to improve including the right dorsal foot substantially the, the distal part of this wound and the area on the right heel. We have been using a wound VAC over this chronically. She is still on trimethoprim as directed by infectious disease 9/4; patient is seen in conjunction with HBO. Right dorsal foot wound substantially anteriorly is better however she continues to have a deep wound in the distal part of this that is not responding. We have been using silver collagen under border foam ooArea on the right plantar medial heel seems better. We have been using Hydrofera Blue 12/12/18 on evaluation today patient appears to be doing about the same with  regard to her wound based on prior measurements. She does have some necrotic tissue noted on the lateral aspect of the wound that is going require a little bit of sharp debridement today. This includes what appears to be potentially either severely necrotic bone or tendon. Nonetheless other than that she does not appear to have any severe infection which is good news 9/18; it is been 2 weeks since I saw this wound. She is tolerating HBO well. Continued dramatic improvement in the area on the right dorsal foot. She still has a small wound on the heel that we have been using Hydrofera Blue. She continues with a wound VAC 9/24; patient has to be seen emergently today with a swelling on her right lateral lower leg. She says that she told Dr. Evette Doffing about this and also myself on a couple of occasions but I really have no recollection of this. She is not systemically unwell and her wound really looked good the last time I saw this. She showed this to providers at dialysis and she was able to verify that she was started on cephalexin today for 5 doses at dialysis. She dialyzes on Tuesday Thursday and Saturday. 10/2; patient is seen in conjunction with HBO. The area that is draining on the right anterior medial tibia is more extensive. Copious amounts of serosanguineous drainage with some purulence. We are still using the wound VAC on the original wound then it is stable. Culture I did of the original IandD showed MRSA I contacted dialysis she is now on vancomycin with dialysis treatments. I asked them to run a month 10/9; patient seen in conjunction with HBO. She had a new spontaneous open area just above the wound on the right medial tibia ankle. More swelling on the right medial tibia. Her wound on the foot looks about the same perhaps slightly better. There is no warmth spreading up her leg but no obvious erythema. her MRI of the foot and ankle and distal tib-fib is not booked for next Friday I  discussed this with her in great detail over multiple days. it is likely she has spreading infection upper leg at least involving the distal 25% above the ankle. She knows that if I refer her to orthopedics for infectious disease they are going to recommend amputation and indeed I am not against this myself. We had a good trial at trying to heal the foot which is what she wanted along with antibiotics debridement and HBO however she clearly has spreading infection [probably staph aureus/MRSA]. Nevertheless she once again tells me she wants to wait the left of the MRI. She still makes comments about having her amputation done in [place]. 10/19; arrives today with significant swelling on the lateral right leg. Last culture I did showed Klebsiella. Multidrug- resistant. Cipro  was intermediate sensitivity and that is what I have her on pending her MRI which apparently is going to be done on Thursday this week although this seems to be moving back and forth. She is not systemically unwell. We are using silver alginate on her major wound area on the right medial foot and the draining areas on the right lateral lower leg 10/26; MRI showed extensive abscess in the anterior compartment of the right leg also widespread osteomyelitis involving osseous structures of the midfoot and portions of the hindfoot. Also suspicion for osteomyelitis anterior aspect of the distal medial malleolus. Culture I did of the purulence once again showed a multidrug-resistant Klebsiella. I have been in contact with nephrology late last week and she has been started on cefepime at dialysis to replace the vancomycin We sent a copy of her MRI report to Dr. Geroge Baseman in [place] who is an orthopedic surgeon. The patient takes great stock in his opinion on this. She says she will go to [place] to have her leg amputated if Dr. Geroge Baseman does not feel there is any salvage options. Objective Constitutional Sitting or standing  Blood Pressure is within target range for patient.. Pulse regular and within target range for patient.Marland Kitchen Respirations regular, non-labored and within target range.. Patient is febrile today. Mild fever at 99.2. Appears in no distress. Vitals Time Taken: 2:15 PM, Height: 67 in, Weight: 125 lbs, BMI: 19.6, Temperature: 99.2 F, Pulse: 103 bpm, Respiratory Rate: 18 breaths/min, Blood Pressure: 137/73 mmHg, Capillary Blood Glucose: 400 mg/dl. General Notes: Patient reported last CBG was 400 last night Respiratory work of breathing is normal. Cardiovascular Pedal pulses palpable on the right. This is not her problem. Psychiatric appears at normal baseline. General Notes: Wound exam; the original wound on the medial part of her remaining foot on the right actually does not look too bad. The problem is on the anterior tibial area with purulent drainage coming out of the 2 minor incisions I made in the lower leg. Patient states she is draining about an ounce and a half of pus out of these a day. There is erythema here but no tenderness because of her neuropathy Integumentary (Hair, Skin) Erythema on the right anterior tibial area. Wound #43 status is Open. Original cause of wound was Gradually Appeared. The wound is located on the Right,Medial Foot. The wound measures 7.5cm length x 4.2cm width x 2.5cm depth; 24.74cm^2 area and 61.85cm^3 volume. There is muscle, tendon, and Fat Layer (Subcutaneous Tissue) Exposed exposed. There is no undermining noted, however, there is tunneling at 6:00 with a maximum distance of 2.5cm. There is a large amount of purulent drainage noted. The wound margin is distinct with the outline attached to the wound base. There is medium (34-66%) red, pink granulation within the wound bed. There is a medium (34-66%) amount of necrotic tissue within the wound bed including Eschar, Adherent Slough and Necrosis of Muscle. Wound #44 status is Open. Original cause of wound was  Gradually Appeared. The wound is located on the Right Calcaneus. The wound measures 0.9cm length x 1cm width x 0.1cm depth; 0.707cm^2 area and 0.071cm^3 volume. There is no tunneling or undermining noted. There is a small amount of serosanguineous drainage noted. The wound margin is flat and intact. There is small (1-33%) pink granulation within the wound bed. There is a large (67-100%) amount of necrotic tissue within the wound bed including Eschar. Wound #46 status is Open. Original cause of wound was Other Lesion. The wound is located  on the Right,Lateral Lower Leg. The wound measures 3.2cm length x 2.5cm width x 2.5cm depth; 6.283cm^2 area and 15.708cm^3 volume. There is Fat Layer (Subcutaneous Tissue) Exposed exposed. There is no tunneling or undermining noted. There is a large amount of purulent drainage noted. The wound margin is distinct with the outline attached to the wound base. There is small (1-33%) pink, pale granulation within the wound bed. There is a large (67-100%) amount of necrotic tissue within the wound bed including Adherent Slough. 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 Dressing Change Frequency: Wound #43 Right,Medial Foot: Change dressing three times week. Wound #44 Right Calcaneus: Change dressing three times week. Wound #46 Right,Lateral Lower Leg: Change dressing three times week. Wound Cleansing: Wound #43 Right,Medial Foot: Clean wound with Wound Cleanser - anasept Wound #44 Right Calcaneus: Clean wound with Wound Cleanser - anasept Wound #46 Right,Lateral Lower Leg: Clean wound with Wound Cleanser - anasept Primary Wound Dressing: Wound #43 Right,Medial Foot: Calcium Alginate with  Silver Wound #44 Right Calcaneus: Calcium Alginate with Silver Wound #46 Right,Lateral Lower Leg: Calcium Alginate with Silver - do not pack, place over wound bed Secondary Dressing: Wound #43 Right,Medial Foot: Kerlix/Rolled [name] ABD pad Wound #44 Right Calcaneus: Kerlix/Rolled [name] Dry [name] Heel Cup Wound #46 Right,Lateral Lower Leg: Kerlix/Rolled [name] ABD pad Edema Control: Elevate legs to the level of the heart or above for 30 minutes daily and/or when sitting, a frequency of: - throughout the day Home Health: [facility] for wound care. - Interim 1. I think the patient probably needs a below-knee amputation but she is not ready to make this decision 2. Awaiting an input from her orthopedic surgeon in [place] Dr. Geroge Baseman 3. I do not think there is a local option to try to conserve something here i.e. drain the abscess. I did offer to consider sending her to orthopedic surgery at [facility]. 4. I have not offered to send her to interventional radiology to drain the abscess because of the underlying osteomyelitis elsewhere which has been refractory to a prolonged courses of antibiotics and hyperbarics Electronic Signature(s) Signed: 01/26/2019 5:48:14 PM By: Linton Ham MD Entered By: Linton Ham on 01/26/2019 15:25:00 -------------------------------------------------------------------------------- SuperBill Details Patient Name: Date of Service: [name] 01/26/2019 Medical Record Number:[id number] Patient Account Number: [id number] Date of Birth/Sex: Treating RN: 07/24/1970 (48 y.o. Nancy Fetter Primary Care Provider: [name] Other Clinician: Referring Provider: Treating Provider/Extender:Kameelah Minish, [name], [name] in Treatment: 20 Diagnosis Coding ICD-10 Codes Code Description 7341990154 Other chronic osteomyelitis, right ankle and foot L97.514 Non-pressure chronic ulcer of other part of right foot  with necrosis of bone E10.621 Type 1 diabetes mellitus with foot ulcer L02.415 Cutaneous abscess of right lower limb Facility Procedures CPT4 Code: [id number] Description: 99214 - WOUND CARE VISIT-LEV 4 EST PT Modifier: Quantity: 1 Physician Procedures CPT4 Code Description: [id number] 80998 - WC PHYS LEVEL 3 - EST PT ICD-10 Diagnosis Description M86.671 Other chronic osteomyelitis, right ankle and foot L97.514 Non-pressure chronic ulcer of other part of right foot E10.621 Type 1 diabetes mellitus with  foot ulcer L02.415 Cutaneous abscess of right lower limb Modifier: with necrosis of Quantity: 1 bone Electronic Signature(s) Signed: 01/26/2019 5:48:14 PM By: Linton Ham MD Signed: 01/26/2019 6:04:10 PM By: [name] RN,  BSN Entered By: [name] on 01/26/2019 15:35:49

## 2019-01-27 ENCOUNTER — Encounter (HOSPITAL_BASED_OUTPATIENT_CLINIC_OR_DEPARTMENT_OTHER): Payer: Medicare HMO | Admitting: Internal Medicine

## 2019-01-28 ENCOUNTER — Encounter (HOSPITAL_BASED_OUTPATIENT_CLINIC_OR_DEPARTMENT_OTHER): Payer: Medicare HMO | Admitting: Physician Assistant

## 2019-01-29 ENCOUNTER — Encounter (HOSPITAL_BASED_OUTPATIENT_CLINIC_OR_DEPARTMENT_OTHER): Payer: Medicare HMO | Admitting: Internal Medicine

## 2019-01-29 IMAGING — DX DG FOOT COMPLETE 3+V*L*
3 series · 3 of 3 positions shown · non-contrast
Comparison: 05/04/2017

CLINICAL DATA: Nonhealing ulcer/wound LEFT fourth toe, diabetes
mellitus

EXAM:
LEFT FOOT - COMPLETE 3+ VIEW

[foot ap]
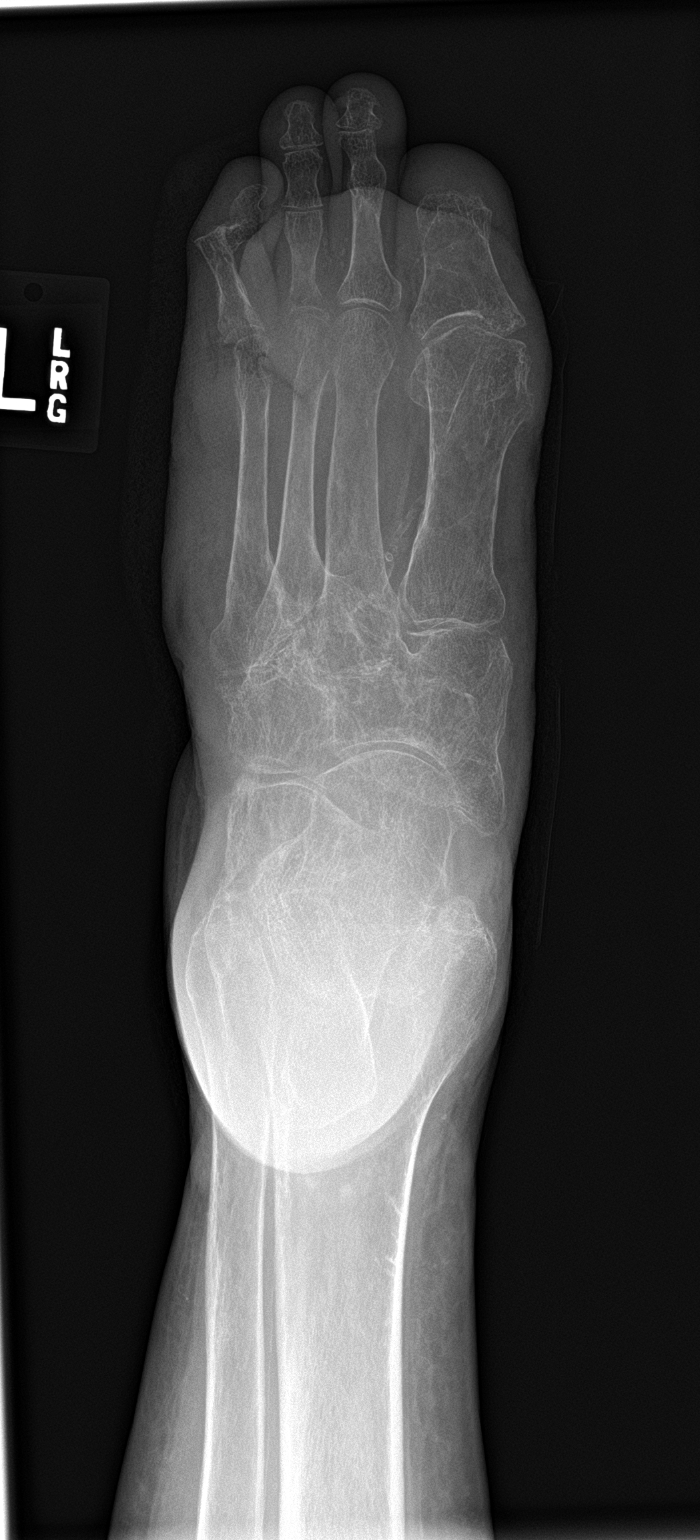

[foot obl]
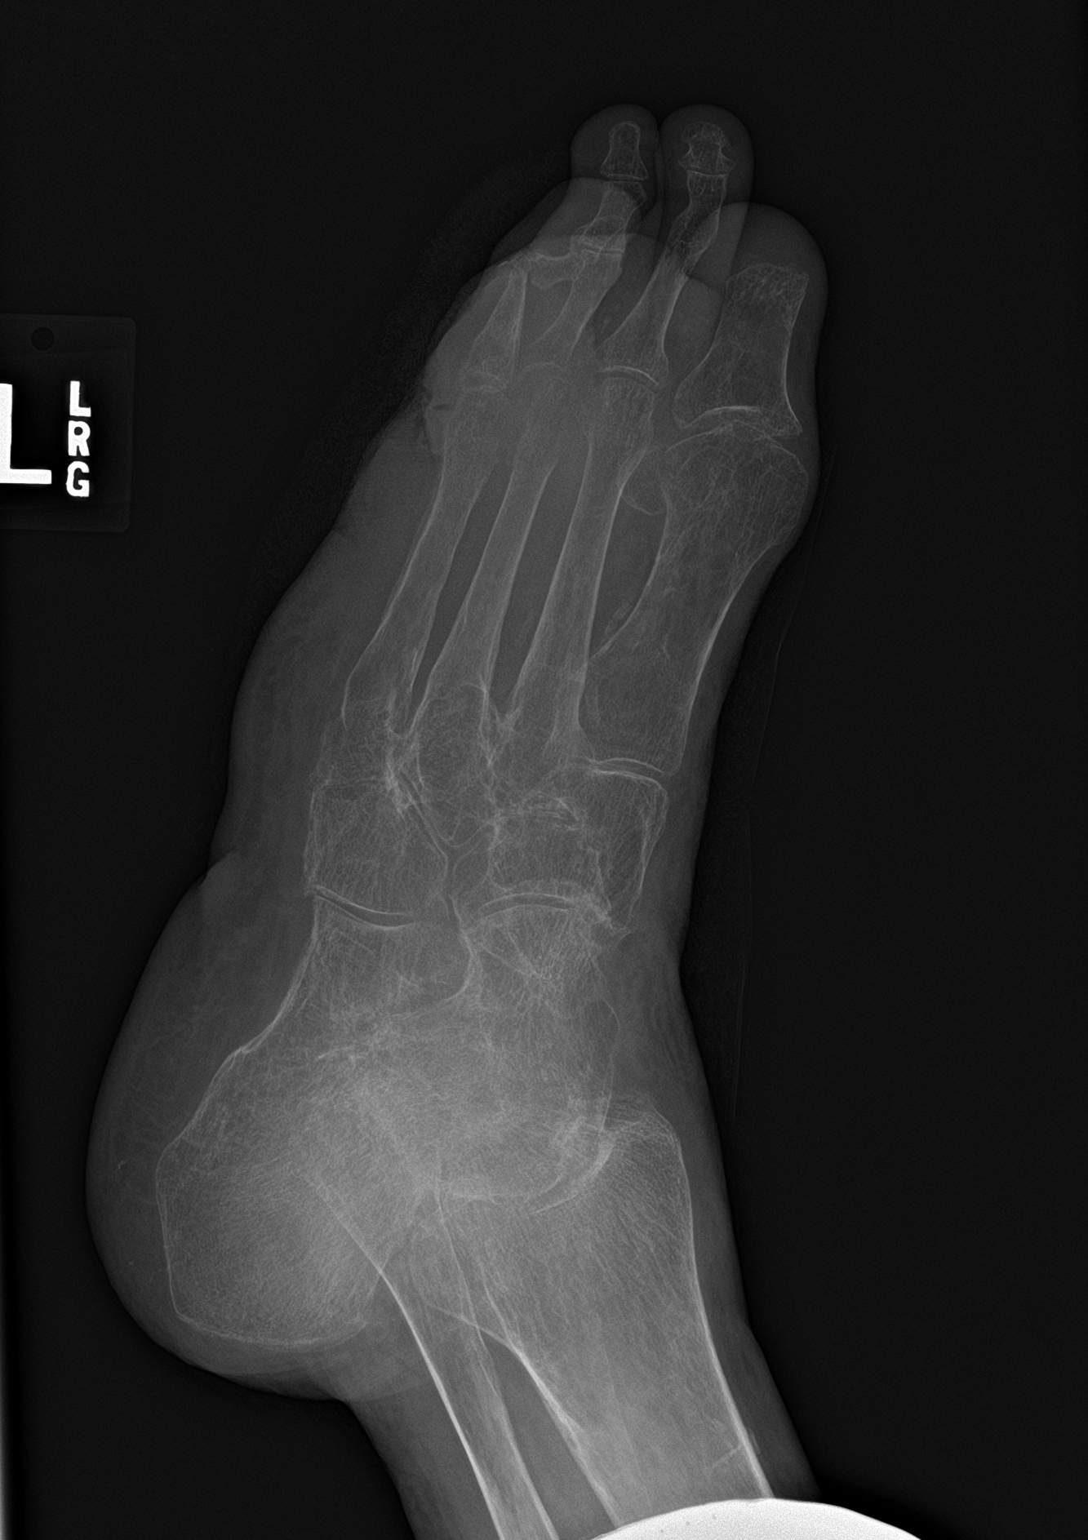

[foot lat]
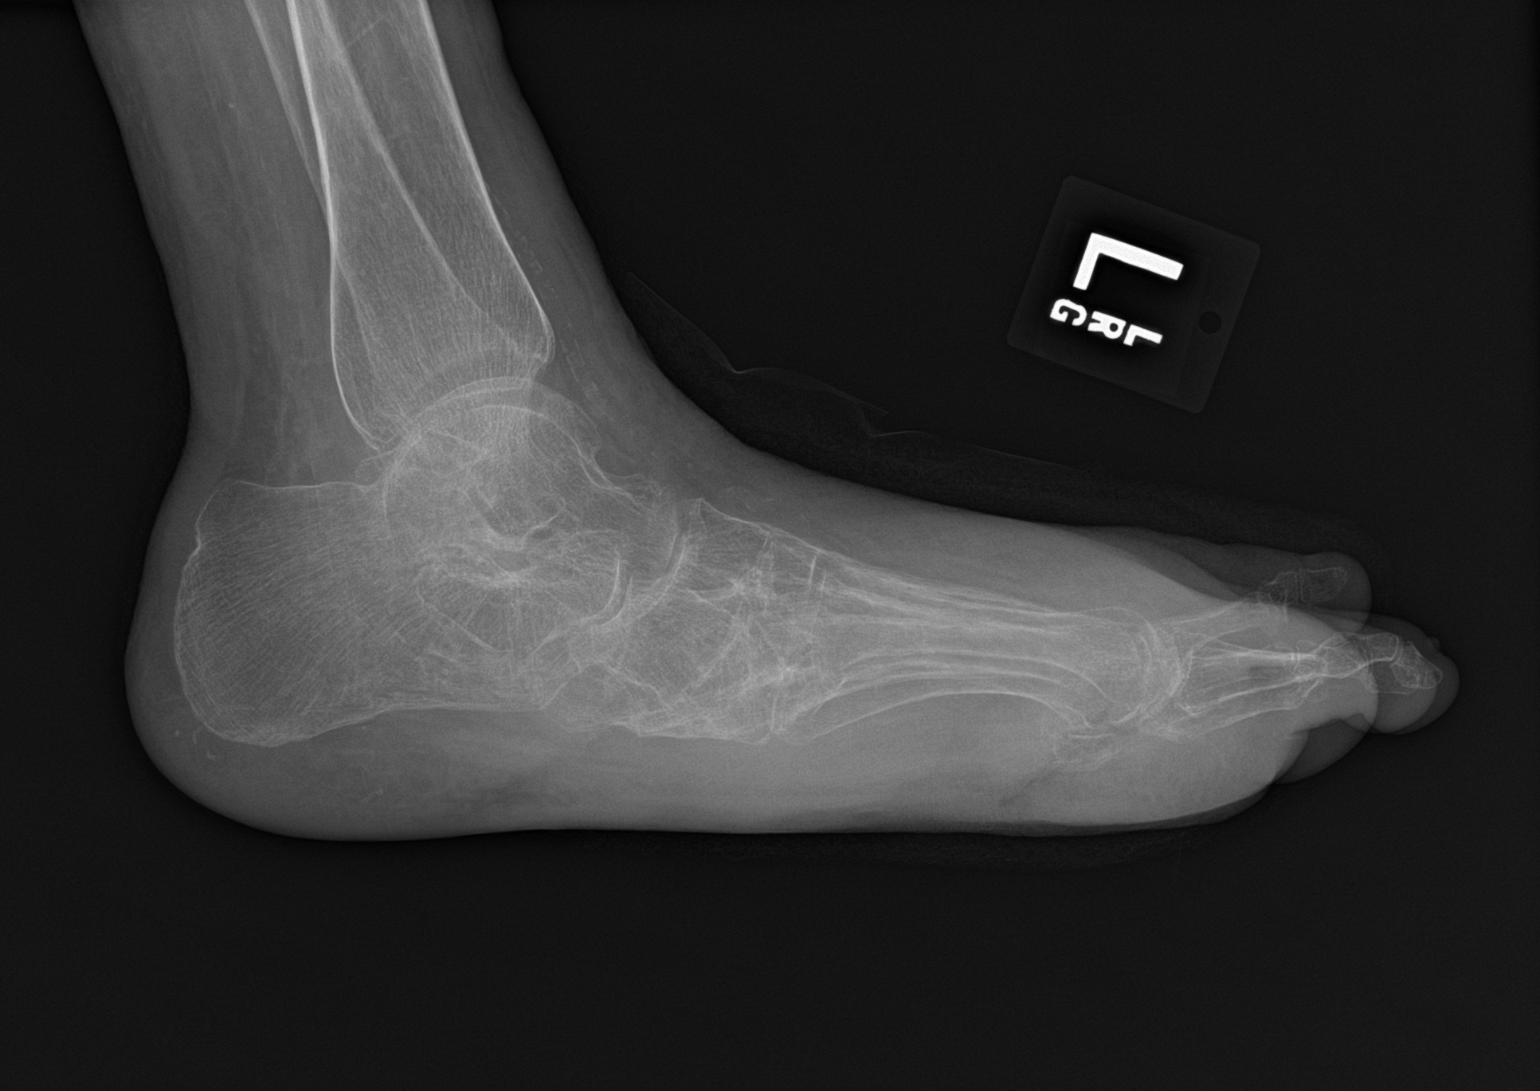

[3 of 3 positions shown; findings below may reference images not displayed]

FINDINGS: Marked osseous demineralization.

Prior amputation of distal phalanx great toe.

Prior amputation of the fifth ray.

PIP subluxation versus dislocation LEFT fourth toe new since prior
exam.

Bone destruction at the head of the proximal phalanx fourth toe and
questionably the base of the middle phalanx raise question of
osteomyelitis and septic arthritis.

Foci of soft tissue gas are seen adjacent to the fourth metatarsal
head.

Periosteal new bone along the shaft of the fourth metatarsal is new
since the previous exam.

Loss of cortical definition of the fourth metatarsal head medially,
third metatarsal head laterally, and base of proximal phalanx third
toe highly suspicious for osteomyelitis.

Integrity at the base of the proximal phalanx of the second toe and
the second metatarsal head is better defined though assessment is
limited by degree of demineralization.

No acute fracture or dislocation.

No additional bone destruction.

Advanced degenerative changes of the TMT joints with pes planus.

Marked soft tissue swelling throughout mid to forefoot.
IMPRESSION: New areas of bone destruction are identified at both sides of the
PIP joint fourth toe, base of proximal phalanx third toe, and at the
third and fourth metatarsal heads highly suspicious for
osteomyelitis.

Additional periosteal new bone along shaft of fourth metatarsal
could be reactive due to osteomyelitis or occult fracture.

Further evaluation by MR imaging recommended.

Prior amputations of fifth ray and distal phalanx great toe.

These results will be called to the ordering clinician or
representative by the Radiologist Assistant, and communication
documented in the PACS or zVision Dashboard.

## 2019-01-30 ENCOUNTER — Encounter (HOSPITAL_BASED_OUTPATIENT_CLINIC_OR_DEPARTMENT_OTHER): Payer: Medicare HMO | Admitting: Internal Medicine

## 2019-02-01 IMAGING — MR MR FOOT*L* W/O CM
4 of 6 series · 22 of 40 positions shown · non-contrast
Comparison: Radiographs 09/03/2017.  MRI 05/06/2017.

CLINICAL DATA: Diabetic with chronic nonhealing left foot wound.
Previous amputation of the 5th ray. Suspected osteomyelitis.

EXAM:
MRI OF THE LEFT FOOT WITHOUT CONTRAST
TECHNIQUE: Multiplanar, multisequence MR imaging of the left forefoot was
performed. No intravenous contrast was administered.

[Series 3: T2 · sagittal · 4.0mm · 0.62mm/px · 6 of 19 slices shown (1 of 2)]
[im 1/19]
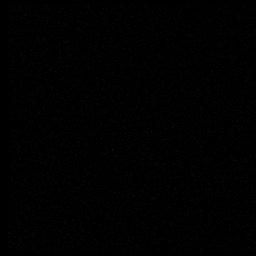
[im 4/19]
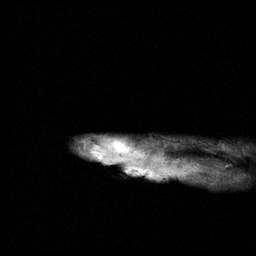
[im 8/19]
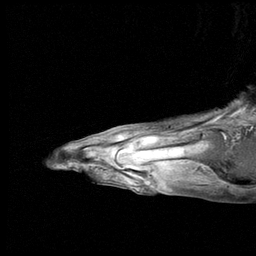
[im 11/19]
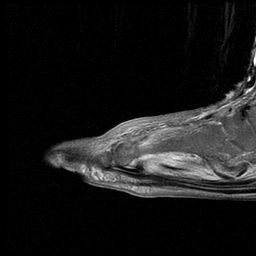
[im 15/19]
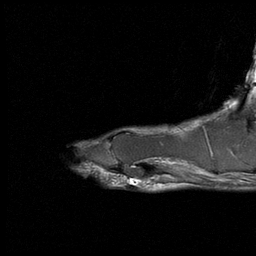
[im 19/19]
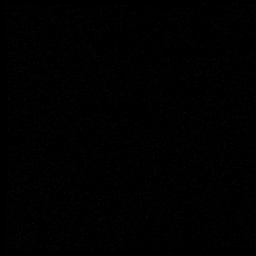

[Series 4: T1 · coronal · 4.0mm · 0.29mm/px · 8 of 33 slices shown (1 of 2)]
[im 1/33]
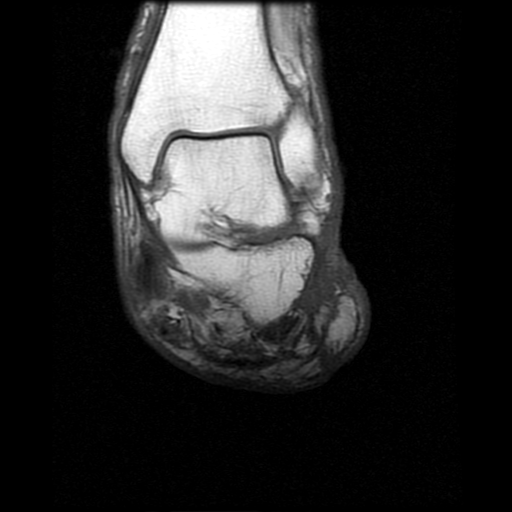
[im 4/33]
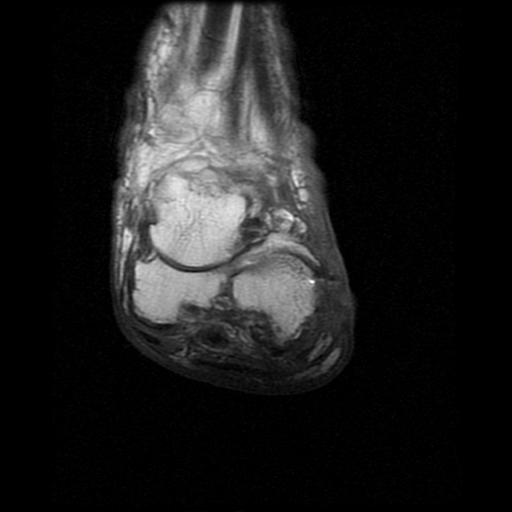
[im 11/33]
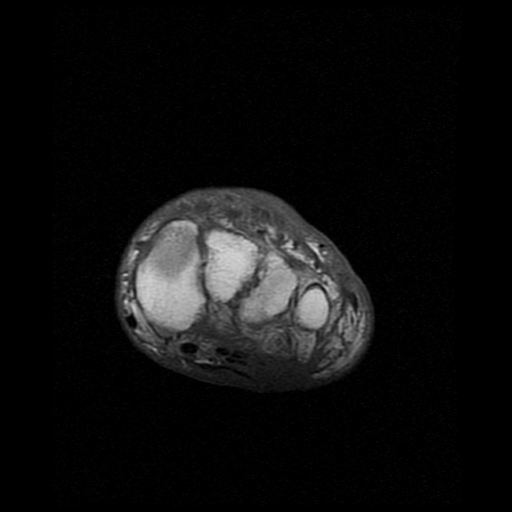
[im 15/33]
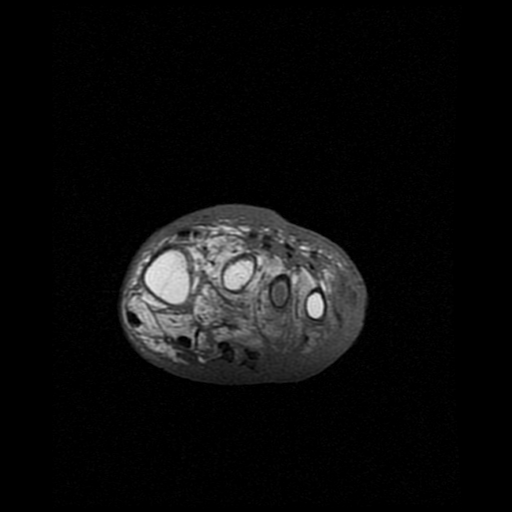
[im 18/33]
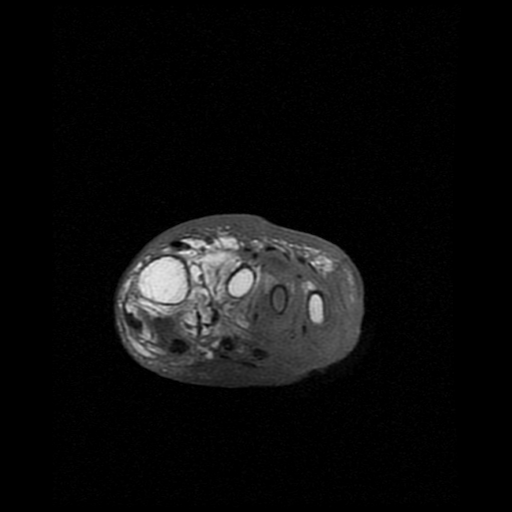
[im 22/33]
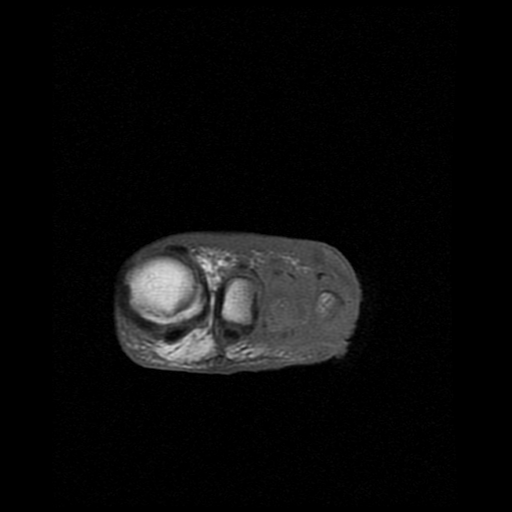
[im 29/33]
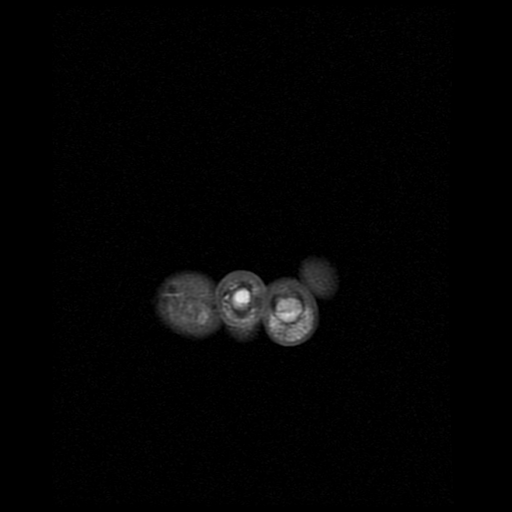
[im 33/33]
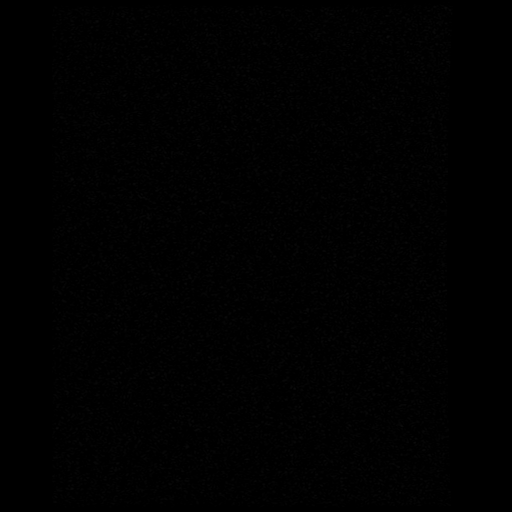

[Series 5: T2 · coronal · 4.0mm · 0.29mm/px · 3 of 33 slices shown (2 of 2)]
[im 5/33]
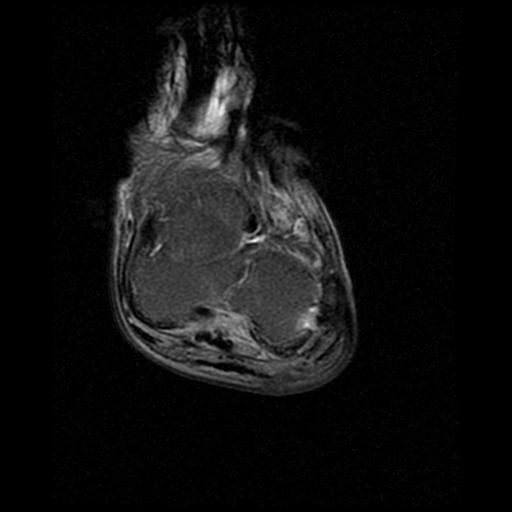
[im 17/33]
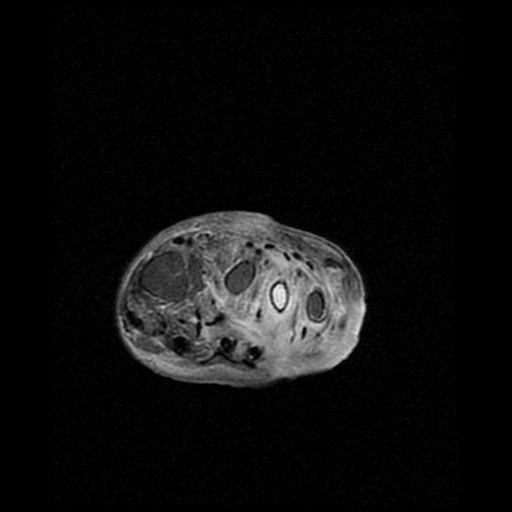
[im 29/33]
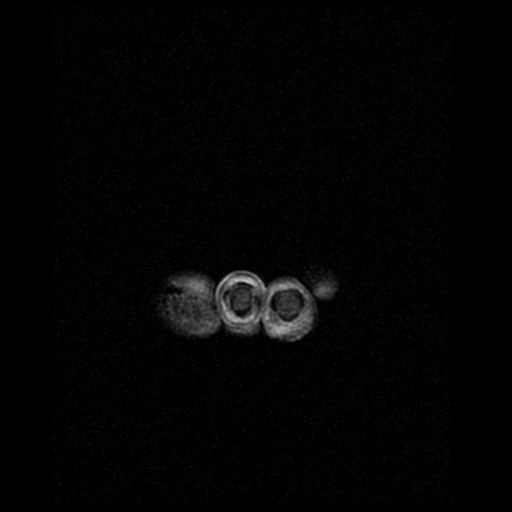

[Series 7: T1 · axial · 4.0mm · 0.31mm/px · z∈[-17,+67]mm · 5 of 18 slices shown (2 of 2)]
[im 1/18]
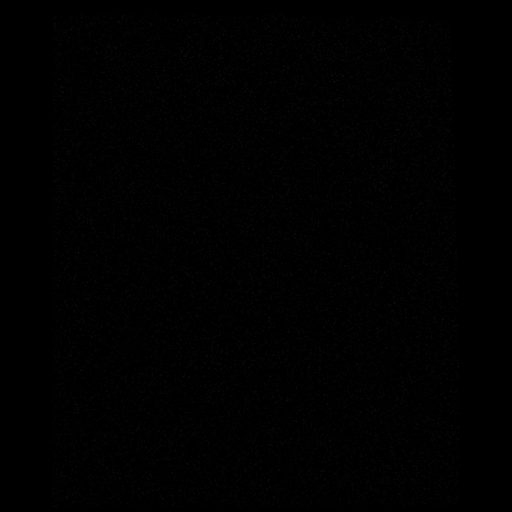
[im 5/18]
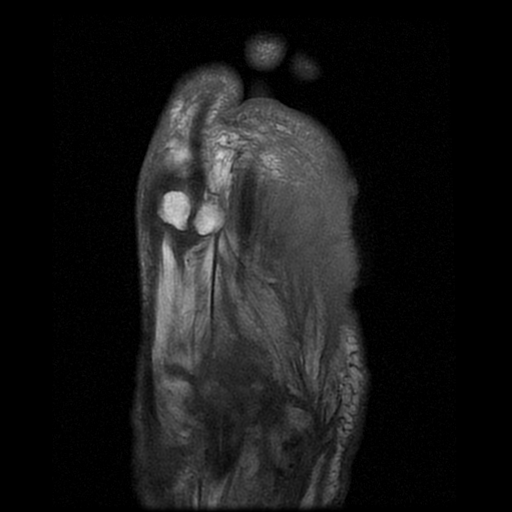
[im 9/18]
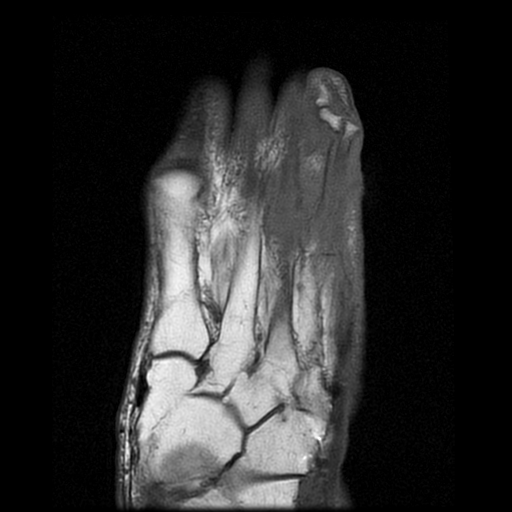
[im 13/18]
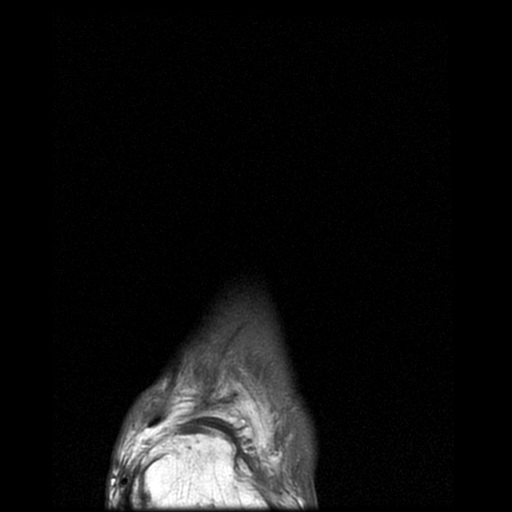
[im 18/18]
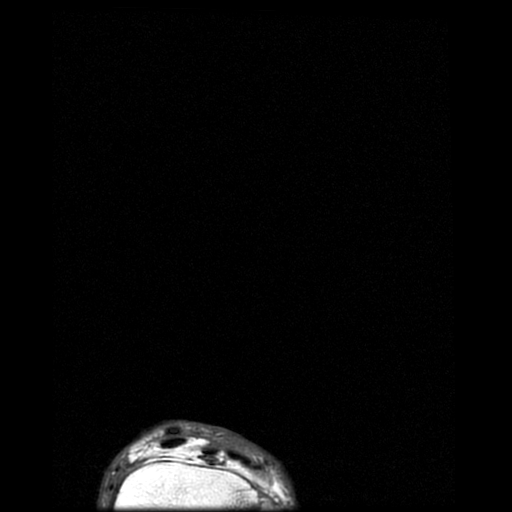

[22 of 40 positions shown; findings below may reference images not displayed]

FINDINGS: Despite efforts by the technologist and patient, mild motion
artifact is present on today's exam and could not be eliminated.
This reduces exam sensitivity and specificity.

Bones/Joint/Cartilage

Previous amputation of the distal 1st phalanx and the 5th ray. There
are significantly progressive abnormalities involving the 3rd ray.
There is bone destruction in the 3rd metatarsal neck and head with
extensive T2 hyperintensity and decreased T1 signal. Similar
findings involve the base of the proximal 3rd phalanx. There is
stable low-level T2 hyperintensity within the 4th metatarsal head,
although signal abnormality within the 4th proximal phalanx has
progressed. There is some deformity of the 4th toe. There is also
low-level T2 hyperintensity involving the lateral head of the 2nd
metatarsal. The 1st ray and visualized bones of the midfoot appear
stable without acute findings. Multifocal chronic ankylosis noted.

Ligaments

The Lisfranc ligament is intact.

Muscles and Tendons

Mild extensor tenosynovitis involving the 3rd and 4th rays.

Soft tissues

There are progressive extensive inflammatory changes throughout the
soft tissues of the lateral forefoot. There is ill-defined fluid
surrounding the 4th metatarsal, extending into the plantar and
dorsal soft tissues. There is probable skin ulceration along the
plantar aspect of the foot.
IMPRESSION: 1. Findings are consistent with progressive left foot infection with
ill-defined soft tissue abscesses laterally, extensor tenosynovitis
and osteomyelitis.
2. The findings of osteomyelitis are definitive within the 3rd
metatarsal shaft and head and bases of the 3rd and 4th proximal
phalanges. There are less specific T2 marrow changes in the 2nd and
4th metatarsal heads without cortical destruction.

## 2019-02-02 ENCOUNTER — Telehealth: Payer: Self-pay | Admitting: *Deleted

## 2019-02-02 ENCOUNTER — Other Ambulatory Visit: Payer: Self-pay

## 2019-02-02 ENCOUNTER — Encounter (HOSPITAL_BASED_OUTPATIENT_CLINIC_OR_DEPARTMENT_OTHER): Payer: Medicare HMO | Attending: Internal Medicine | Admitting: Internal Medicine

## 2019-02-02 DIAGNOSIS — M86671 Other chronic osteomyelitis, right ankle and foot: Secondary | ICD-10-CM | POA: Insufficient documentation

## 2019-02-02 DIAGNOSIS — E10621 Type 1 diabetes mellitus with foot ulcer: Secondary | ICD-10-CM | POA: Diagnosis not present

## 2019-02-02 DIAGNOSIS — E1069 Type 1 diabetes mellitus with other specified complication: Secondary | ICD-10-CM | POA: Diagnosis not present

## 2019-02-02 DIAGNOSIS — L02415 Cutaneous abscess of right lower limb: Secondary | ICD-10-CM | POA: Diagnosis not present

## 2019-02-02 DIAGNOSIS — E1036 Type 1 diabetes mellitus with diabetic cataract: Secondary | ICD-10-CM | POA: Diagnosis not present

## 2019-02-02 DIAGNOSIS — E1040 Type 1 diabetes mellitus with diabetic neuropathy, unspecified: Secondary | ICD-10-CM | POA: Insufficient documentation

## 2019-02-02 DIAGNOSIS — L97514 Non-pressure chronic ulcer of other part of right foot with necrosis of bone: Secondary | ICD-10-CM | POA: Diagnosis not present

## 2019-02-02 DIAGNOSIS — Z86718 Personal history of other venous thrombosis and embolism: Secondary | ICD-10-CM | POA: Diagnosis not present

## 2019-02-02 DIAGNOSIS — I10 Essential (primary) hypertension: Secondary | ICD-10-CM | POA: Diagnosis not present

## 2019-02-02 DIAGNOSIS — G4730 Sleep apnea, unspecified: Secondary | ICD-10-CM | POA: Insufficient documentation

## 2019-02-02 DIAGNOSIS — D649 Anemia, unspecified: Secondary | ICD-10-CM | POA: Diagnosis not present

## 2019-02-02 DIAGNOSIS — G40909 Epilepsy, unspecified, not intractable, without status epilepticus: Secondary | ICD-10-CM | POA: Insufficient documentation

## 2019-02-02 DIAGNOSIS — E1051 Type 1 diabetes mellitus with diabetic peripheral angiopathy without gangrene: Secondary | ICD-10-CM | POA: Diagnosis not present

## 2019-02-02 NOTE — Progress Notes (Signed)
Nancy, [name] ([id number]) Visit Report for 02/02/2019 Debridement Details Patient Name: Date of Service: Nancy, [name] 02/02/2019 1:30 PM Medical Record Number:[id number] Patient Account Number: [id number] Date of Birth/Sex: Treating RN: 08-Dec-1970 (48 y.o. [name] Primary Care Provider: [name] Other Clinician: Referring Provider: Treating Provider/Extender:Janeice Stegall, [name], [name] in Treatment: 21 Debridement Performed for Wound #43 Right,Medial Foot Assessment: Performed By: Physician Ricard Dillon., MD Debridement Type: Debridement Severity of Tissue Pre Fat layer exposed Debridement: Level of Consciousness (Pre- Awake and Alert procedure): Pre-procedure Verification/Time Out Taken: Yes - 15:18 Start Time: 15:22 Total Area Debrided (L x W): 4 (cm) x 4 (cm) = 16 (cm) Tissue and other material Viable, Non-Viable, Slough, Subcutaneous, Slough debrided: Level: Skin/Subcutaneous Tissue Debridement Description: Excisional Instrument: Curette Bleeding: Minimum Hemostasis Achieved: Pressure End Time: 15:24 Procedural Pain: 0 Post Procedural Pain: 0 Response to Treatment: Procedure was tolerated well Level of Consciousness Awake and Alert (Post-procedure): Post Debridement Measurements of Total Wound Length: (cm) 8.3 Width: (cm) 4.4 Depth: (cm) 0.2 Volume: (cm) 5.737 Character of Wound/Ulcer Post Stable Debridement: Severity of Tissue Post Debridement: Fat layer exposed Post Procedure Diagnosis Same as Pre-procedure Electronic Signature(s) Signed: 02/02/2019 5:57:33 PM By: Linton Ham MD Signed: 02/02/2019 6:00:40 PM By: [name] RN, BSN Entered By: Linton Ham on 02/02/2019 15:36:45 -------------------------------------------------------------------------------- Debridement Details Patient Name: Date of Service: [name] 02/02/2019 1:30 PM Medical Record Number:[id number] Patient Account Number:  [id number] Date of Birth/Sex: Treating RN: 17-Dec-1970 (48 y.o. [name] Primary Care Provider: [name] Other Clinician: Referring Provider: Treating Provider/Extender:Deaken Jurgens, [name], [name] in Treatment: 21 Debridement Performed for Wound #46 Right,Lateral Lower Leg Assessment: Performed By: Physician Ricard Dillon., MD Debridement Type: Debridement Level of Consciousness (Pre- Awake and Alert procedure): Pre-procedure Yes - 15:18 Verification/Time Out Taken: Start Time: 15:18 Total Area Debrided (L x W): 1.6 (cm) x 1.6 (cm) = 2.56 (cm) Tissue and other material Viable, Non-Viable, Subcutaneous debrided: Level: Skin/Subcutaneous Tissue Debridement Description: Excisional Instrument: Blade, Forceps Bleeding: Minimum Hemostasis Achieved: Pressure End Time: 15:20 Procedural Pain: 0 Post Procedural Pain: 0 Response to Treatment: Procedure was tolerated well Level of Consciousness Awake and Alert (Post-procedure): Post Debridement Measurements of Total Wound Length: (cm) 1.6 Width: (cm) 1.6 Depth: (cm) 2.7 Volume: (cm) 5.429 Character of Wound/Ulcer Post Stable Debridement: Post Procedure Diagnosis Same as Pre-procedure Electronic Signature(s) Signed: 02/02/2019 5:57:33 PM By: Linton Ham MD Signed: 02/02/2019 6:00:40 PM By: [name] RN, BSN Entered By: Linton Ham on 02/02/2019 15:36:55 -------------------------------------------------------------------------------- HPI Details Patient Name: Date of Service: [name] 02/02/2019 1:30 PM Medical Record Number:[id number] Patient Account Number: [id number] Date of Birth/Sex: Treating RN: 12/03/1970 (48 y.o. [name] Primary Care Provider: [name] Other Clinician: Referring Provider: Treating Provider/Extender:Gabor Lusk, [name], [name] in Treatment: 21 History of Present Illness HPI Description: 48 year old diabetic who is known to  have type 1 diabetes which is poorly controlled last hemoglobin A1c was 11%. She comes in with a ulcerated area on the left lateral foot which has been there for over 6 months. Was recently she has been treated by [name] of podiatry who saw her last on 05/28/2016. Review of his notes revealed that the patient had incision and drainage with placement of antibiotic beads to the left foot on 04/11/2016 for possible osteomyelitis of the cuboid bone. Over the last year she's had a history of amputation of the left fifth toe and a femoropopliteal popliteal bypass graft somewhere in April 2017. 2 years  ago she's had a right transmetatarsal amputation. His note [name] mentions that the patient has been referred to me for further wound care and possibly great candidate for hyperbaric oxygen therapy due to recurrent osteomyelitis. However we do not have any x-rays of biopsy reports confirming this. He has been on several antibiotics including Bactrim and most recently is on doxycycline for an MRSA. I understand, the patient was not a candidate for IV antibiotics as she has had previous PICC lines which resulted in blood clots in both arms. There was a x-ray report dated 04/04/2016 on [name] notes which showed evidence of fifth ray resection left foot with osteolytic changes noted to the fourth metatarsal and cuboid bone on the left. 06/13/2016 -- had a left foot x-ray which showed no acute fracture or dislocation and no definite radiographic evidence of osteomyelitis. Advanced osteopenia was seen. 06/20/2016 -- she has noticed a new wound on the right plantar foot in the region where she had a callus before. 06/27/16- the patient did have her x-ray of the right foot which showed no findings to suggest osteomyelitis. She saw her endocrinologist, [name], yesterday. Her A1c in January was 11. He also indicates mismanagement and noncompliance regarding her diabetes. She is currently on Bactrim for a  lip infection. She is complaining of nausea, vomiting and diarrhea. She is unable to articulate the exact orders or dosing of the Bactrim; it is unclear when she will complete this. 07/04/2016 -- results from [facility] of ABIs with ankle waveforms were noted from 02/14/2016. The examination done on 06/27/2015 showed noncompressible ABIs with the right being 1.45 and the left being 1.33. The present examination showed a right ABI of 1.19 on the left of 1.33. The conclusion was that right normal ABI in the lower extremity at rest however compared to previous study which was noncompressible ABI may be falsely elevated side suggesting medial calcification. The left ABI suggested medial calcification. 08/01/2016 -- the patient had more redness and pain on her right foot and did not get to come to see as noted she see her PCP or go to the ER and decided to take some leftover metronidazole which she had at home. As usual, the patient does report she feels and is rather noncompliant. 08/08/2016 -- -- x-ray of the right foot -- FINDINGS:Transmetatarsal amputation is noted. No bony destruction is noted to suggest osteomyelitis. IMPRESSION: No evidence of osteomyelitis. Postsurgical changes are seen. MRI would be more sensitive for possible bony changes. Culture has grown Serratia Marcescens -- sensitive to Bactrim, ciprofloxacin, ceftazidime she was seen by [name] on 08/06/2016. He did not find any exposed bone, muscle, tendon, ligament or joint. There was no malodor and he did a excisional debridement in the office. ============ Old notes: 48 year old patient who is known to the wound clinic for a while had been away from the wound clinic since 09/01/2014. Over the last several months she has been admitted to various hospitals including [place] at [facility]. She was treated for a right metatarsal osteomyelitis with a transmetatarsal amputation and this was done about 2 months  ago. He has a small ulcerated area on the right heel and she continues to have an ulcerated area on the left plantar aspect of the foot. The patient was recently admitted to the [facility] hospital group between 7/12 and 10/18/2014. she was given 3 weeks of IV vancomycin and was to follow-up with her surgeons at [facility] and also took oral vancomycin for C.  difficile colitis. 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 MRI of the left foot done on 10/14/2014 showed no abscess or osteomyelitis. 04/27/15; this is a patient we know from previous stays in the wound care center. She is a type I diabetic I am not sure of her control currently. Since the last time I saw her she is had a right transmetatarsal amputation and has no wounds on her right foot and has no open wounds. She is been followed at the wound care center at [facility] in [place]. She comes today with the desire to undergo hyperbaric treatment locally. Apparently one of her wound care providers in [place] has suggested hyperbarics. This is in response to an MRI from 04/18/15 that showed increased marrow signal and loss of the proximal fifth metatarsal cortex evidence of osteomyelitis with likely early osteomyelitis in the cuboid bone as well. She has a large wound over the base of the fifth metatarsal. She also has a eschar over her the tips of her toes on 1,3 and 5. She does not have peripheral pulses and apparently is going for an angiogram tomorrow which seems reasonable. After this she is going to infectious disease at [facility]. They have been using Medihoney to the large wound on the lateral aspect of the left foot to. The patient has  known Charcot deformity from diabetic neuropathy. She also has known diabetic PAD. Surprisingly I can't see that she has had any recent antibiotics, the patient states the last antibiotic she had was at the end of November for 10 days. I think this was in response to culture that showed group G strep although I'm not exactly sure where the culture was from. She is also had arterial studies on 03/29/15. This showed a right ABI of 1.4 that was noncompressible. Her left ABI was 0.73. 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 It was not felt that arterial inflow was adequate for healing of a foot ulcer. Her Doppler waveforms looked monophasic ===== READMISSION 02/28/17; this is in an now 48 year old woman we've had at several different occasions in this clinic. 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 She has a remote ex-smoker. She was last seen in this clinic by Dr. Con Memos I think in May. More recently she is been followed by her podiatrist [name] an infectious disease Dr. Megan Salon. She has 2 open wounds the major one is over the right first metatarsal head she also has a wound on the left plantar foot. an MRI of the right foot on 01/01/17 showed a soft tissue ulcer along the plantar aspect of the first metatarsal base consistent with osteomyelitis of the first metatarsal stump. Dr. 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 According to the patient this is been open for slightly over a month. She has been on a combination of Cipro 500 twice a day, Zyvox 600 twice a day and Flagyl 500 3 times a day for over a month now as directed by Dr. Megan Salon. cultures of the right foot earlier this year showed MRSA in January and Serratia in May. January also had a few viridans strep. Recent x-rays of both feet were done and [name]  office and I don't have these  reports. The patient has known PAD and has a history of aleft femoropopliteal bypass in April 2017. 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 Dwyane Dee. In spite of this the last hemoglobin A1c I can see is 10.1 on 01/01/2017. She is being referred by 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 She is been using Medihoney to this area and also an area on the left midfoot. She is using healing sandals bilaterally. ABIs in this clinic at the left posterior tibial was 1.1 noncompressible on the right READMISSION Non invasive vascular NOVANT 5/18 Aftercare following surgery of the circulatory system Procedure Note - Interface, External Ris In - 08/13/2016 11:05 AM EDT Procedure: Examination consists of physiologic resting arterial pressures of the brachial and ankle arteries bilaterally with continuous wave Doppler waveform analysis. Previous: Previous exam performed on 02/14/16 demonstrated ABIs of Rt = 1.19 and Lt = 1.33. Right: ABI = non-compressible PT, 1.47 DP. S/P transmet amputation. Left: ABI = 1.52, 2nd digit pressure = 87 mmHg Conclusions: Right: ABI (>1.3) may be falsely elevated, suggesting medial calcification. Left: ABI (>1.3) may be falsely elevated, suggesting medial calcification The patient is a now 48 year old type I diabetic is had multiple issues her graded to chronic diabetic foot ulcers. 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 We had her in the clinic lastin November. At that point she had wounds on her bilateral feet.she had wanted to try hyperbarics however the healogics review process denied her because she hadn't followed up with her vascular  surgeon for her left femoropopliteal bypass. The bypass was done by Dr. Raul Del at [facility]. We made her a follow-up with Dr. Raul Del however she did not keep the appointment and therefore she was not approved The patient shows me a small wound on her left fourth metatarsal head on her phone. She developed rapid discoloration in the plantar aspect of the left foot and she was admitted to hospital from 2/2 through 05/10/17 with wet gangrene of the left foot osteomyelitis of the fourth metatarsal heads. She was admitted acutely ill with a temperature of 103. She was started on broad-spectrum vancomycin and cefepime. On 05/06/17 she was taken to the OR by [name] her podiatric surgeon for an incision and drainage irrigation of the left foot wound. Cultures from this surgery revealed group be strep and anaerobes. she was seen by Dr.Xu of orthopedic surgery and scheduled for a below-knee amputation which she u refused. Ultimately she was discharged on Levaquin and Flagyl for one month. MRI 05/05/17 done while she was in the hospital showed abscess adjacent to the fourth metatarsal head and neck small abscess around the fourth flexor tendon. Inflammatory phlegmon and gas in the soft tissues along the lateral aspect of the fourth phalanx. Findings worrisome for osteomyelitis involving the fourth proximal and middle phalanx and also the third and fourth metatarsals. Finally the patient had actually shortly before this followed up with Dr. Raul Del at no time on 04/29/17. He felt that her left femoropopliteal bypass was patent he felt that her left-sided toe pressures more than adequate for healing a wound on the left foot. This was before her acute presentation. Her noninvasive diabetes  are listed above. 05/28/17; she is started hyperbarics. The patient tells me that for some reason she was not actually on Levaquin but I think on ciprofloxacin. She was on Flagyl. She only started her Levaquin yesterday due  to some difficulty with the pharmacy and perhaps her sister picking it up. She has an appointment with [name] tomorrow and with infectious disease early next week. She has no new complaints 06/06/17; the patient continues in hyperbarics. She saw [name] on 05/29/17 who is her podiatric surgeon. He is elected for a transmetatarsal amputation on 06/27/17. I'm not sure at what level he plans to do this amputation. The patient is unaware She also saw Dr. Megan Salon of infectious disease who elected to continue her on current antibiotics I think this is ciprofloxacin and Flagyl. I'll need to clarify with her tomorrow if she actually has this. We're using silver alginate to the actual wound. Necrotic surface today with material under the flap of her foot. Original MRI showed abscesses as well as osteomyelitis of the proximal and middle fourth phalanx and the third and fourth metatarsal heads 06/11/17; patient continues in hyperbarics and continues on oral antibiotics. She is doing well. The wound looks better. The necrotic part of this under the flap in her superior foot also looks better. she is been to see [name]. I haven't had a chance to look at his note. Apparently he has put the transmetatarsal amputation on hold her request it is still planning to take her to the OR for debridement and product application ACEL. I'll see if I can find his note. I'll therefore leave product ordering/requests to [name] for now. I was going to look at Dermagraft 06/18/17-she is here in follow-up evaluation for bilateral foot wounds. She continues with hyperbaric therapy. She states she has been applying manuka honey to the right plantar foot and alternate manuka honey and silver alginate to the left foot, despite our orders. We will continue with same treatment plan and she will follo up next week. 06/25/17; I have reviewed [name] last note from 3/11. She has operative debridement in 2 days' time. By review  his note apparently they're going to place there is skin over the majority of this wound which is a good choice. She has a small satellite area at the most proximal part of this wound on the left plantar foot. The area on the right plantar foot we've been using silver alginate and it is close to healing. 07/02/17; unfortunately the patient was not easily approved for [name] proposed surgery. I'm not completely certain what the issue is. She has been using silver alginate to the wound she has completed a first course of hyperbarics. She is still on Levaquin and Flagyl. I have really lost track of the time course here.I suspect she should have another week to 2 of antibiotics. I'll need to see if she is followed up with infectious disease Dr. Megan Salon 07/09/17; the patient is followed up with Dr. Megan Salon. 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 She continues on Levaquin and metronidazole continuing both of these for now I think she is been on fr about 6 weeks. She still has some drainage but no pain. No fever. Her had been plans for her to go to the OR for operative debridement with her podiatrist [name], I am not exactly sure where that is. I'll probably slip a note to [name] today. I  note that she follows with Dr. Dwyane Dee of endocrinology. We have her recertified for hyperbaric oxygen. I have not heard about Dermagraft however I'll see if [name] is planning a skin substitute as well 07/16/17; the patient tells me she is just about out of Hartley. I'll need to check Dr. Hale Bogus last notes on this. She states she has plenty of Flagyl however. She comes in today complaining of pain in the right lateral foot which she said lasted for about a day. The wound on the right foot is actually much more medially. She also tells me that the [facility] cost a lot of pain in the left foot wound and she turned back to silver alginate. Finally  Dermagraft has a $629 per application co-pay. She cannot afford this 07/23/17; patient arrives today with the wound not much smaller. There is not much new to add. She has not heard from [name] all try to put in a call to them today. She was asking about Dermagraft again and she has an over $528 per application co-pay she states that she would be willing to try to do a payment plan. I been tried to avoid this. We've been using silver alginate, I'll change to [facility] 07/30/17-She is here in follow-up evaluation for left foot ulcer. She continues hyperbaric medicine. The left foot ulcer is stable we will continue with same treatment plan 08/06/17; she is here for evaluation of her left foot ulcer. Currently being treated for hyperbarics or underlying osteomyelitis. She is completed antibiotics. The left foot ulcer is better smaller with healthier looking granulation. For various reasons I am not really clear on we never got her back to the OR with [name]. He did not respond to my secure text message. Nevertheless I think that surgery on this point is not necessary nor am I completely clear that a skin substitute is necessary The patient is complaining about pain on the outside of her right foot. 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 There is no erythema. She also states the foot is warm versus her other part of her upper leg and this is largely true. It is not totally clear to me what's causing this. She thinks it's different from her usual neuropathy pain 08/13/17; she arrives in clinic today with a small wound which is superficial on her right first metatarsal head. 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 She tells [place] she was up on her feet over the Mother's Day celebration. The large wound is on the left foot. Continues with hyperbarics for underlying osteomyelitis. We're using Hydrofera Blue. She asked me today about where we were with Dermagraft.  I had actually excluded this because of the co- pay however she wants to assume this therefore I'll recheck the co-pay an order for next week. 08/20/17; the patient agreed to accept the co-pay of the first Apligraf which we applied today. She is disappointed she is finishing hyperbarics will run this through the insurance on the extent of the foot infection and the extent of the wound that she had however she is already had 60 dive's. Dermagraft No. 1 08/27/17; Dermagraft No. 2. She is not eligible for any more hyperbaric treatments this month. She reports a fair amount of drainage and she actually changed to the external dressings without disturbing the direct contact layer 09/03/17; the patient arrived in clinic today with the wound superficially looking quite healthy. Nice vibrant red tissue with some advancing epithelialization although not  as much adherence of the flap as I might like. However she noted on her own fourth toe some bogginess and she brought that to our attention. Indeed this was boggy feeling like a possibility of subcutaneous fluid. 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 She is not been unwell. We've been using Dermagraft 09/10/17; the culture that I did not last week was MRSA. She saw Dr. Megan Salon this morning who is going to start her on vancomycin. I had sent him a secure a text message yesterday. I also spoke with her podiatric surgeon [name] about surgery on this foot the options for conserving a functional foot etc. Promised me he would see her and will make back consultation today. Paradoxically her actual wound on the plantar aspect of her left foot looks really quite good. I had given her 5 days worth of Baxdella to cover her for MRSA. Her MRI came back showing osteomyelitis within the third metatarsal shaft and head and base of the third and fourth proximal phalanx. She had extensive  inflammatory changes throughout the soft tissue of the lateral forefoot. With an ill-defined fluid around the fourth metatarsal extending into the plantar and dorsal soft tissues 09/19/17; the patient is actually on oral Septra and Flagyl. She apparently refused IV vancomycin. She also saw [name] at my request who is planning her for a left BKA sometime in mid July. MRI showed osteomyelitis within the third metatarsal shaft and head and the basis of the third and fourth proximal phalanx. I believe there was felt to be possible septic arthritis involving the third MTP. 09/26/17; the patient went back to Dr. Megan Salon at my suggestion and is now receiving IV daptomycin. Her wound continues to look quite good making the decision to proceed with a transmetatarsal amputation although more difficult for the patient. I believe in my extensive discussions with her she has a good sense of the pros and cons of this. I don't NV the tuft decision she has to make. She has an appointment with [name] I believe in mid July and I previously spoken to him about this issue Has we had used 3 previous Dermagraft. Given the condition of the wound surface I went ahead and added the fourth one today area and I did this not fully realizing that she'll be traveling to [place] next week. I'm hopeful she can come back in 2 weeks 10/21/17; Her same Dermagraft on for about 3-1/2 weeks. In spite of this the wound arrives looking quite healthy. There is been a lot of healing dimensions are smaller. Looking at the square shaped wound she has now there is some undermining and some depth medially under the undermining although I cannot palpate any bone. No surrounding infection is obvious. She has difficult questions about how to look at this going forward vis--vis amputations versus continued medical therapy. To be truthful the wound is looks so healthy and it is continued to contract. Hard to justify foot surgery at this  point although I still told her that I think it might come to that if we are not able to eradicate the underlying MRSA. She is still highly at risk and she understands this 11/06/17 on evaluation today patient appears to be doing better in regard to her foot ulcer. She's been tolerating the dressing changes without complication. Currently she is here for her Dermagraft #6. Her wound continues to make  excellent progress at this point. She does not appear to have any evidence of infection which is good news. 11/13/17 on evaluation today patient appears to be doing excellent at this time. She is here for repeat Dermagraft application. This is #7. Overall her wound seems to be making great progress. 12/05/17; the patient arrives with the wound in much better condition than when I last saw this almost 6 weeks ago. She still has a small probing area in the left metatarsal head region on the lateral aspect of her foot. We applied her last Dermagraft today. Since the last time she is here she has what appears to a been a blood blister on the plantar aspect of left foot although I don't see this is threatening. There is also a thick raised tissue on the right mid metatarsal head region. This was not there I don't think the last time she was here 3 weeks ago. 12/12/17; the patient continues to have a small programming area in the left metatarsal head region on the lateral aspect of her foot which was the initial large surgical wound. I applied her last Apligraf last week. I'm going to use Endoform starting today Unfortunately she has an excoriated area in the left mid foot and the right mid foot. The left midfoot looks like a blistered area this was not opened last week it certainly is open today. Using silver alginate on these areas. She promises me she is offloading this. 12/19/17; the small probing area in the left metatarsal head eyes think is shallower. In general her original wound looks better. We've been  using Endoform. The area inferiorly that I think was trauma last week still requires debridement a lot of nonviable surface which I removed. She still has an open open area distally in her foot Similarly on the right foot there is tightly adherent surface debris which I removed. Still areas that don't look completely epithelialized. This is a small open area. We used silver alginate on these areas 12/26/2017; the patient did not have the supplies we ordered from last week including the Endoform. The original large wound on the left lateral foot looks healthy. She still has the undermining area that is largely unchanged from last week. She has the same heavily callused raised edged wounds on the right mid and left midfoot. Both of these requiring debridement. We have been using silver alginate on these areas 01/02/2018; there is still supply issues. We are going to try to use Prisma but I am not sure she actually got it from what she is saying. She has a new open area on the lateral aspect of the left fourth toe [previous fifth ray amputation]. Still the one tunneling area over the fourth metatarsal head. The area is in the midfoot bilaterally still have thick callus around them. She is concerned about a raised swelling on the lateral aspect of the foot. However she is completely insensate 01/10/2018; we are using Prisma to the wounds on her bilateral feet. Surprisingly the tunneling area over the left fourth metatarsal head that was part of her original surgery has closed down. She has a small open area remaining on the incision line. 2 open areas in the midfoot. 02/10/2018; the patient arrives back in clinic after a month hiatus. She was traveling to visit family in [place]. Is fairly clear she was not offloading the areas on her feet. The original wound over the left lateral foot at the level of metatarsal heads is reopened and probes  medially by about a centimeter or 2. She notes that a week ago  she had purulent drainage come out of an area on the left midfoot. Paradoxically the worst area is actually on the right foot is extensive with purulent drainage. We will use silver alginate today 02/17/2018; the patient has 3 wounds one over the left lateral foot. She still has a small area over the metatarsal heads which is the remnant of her original surgical wound. This has medial probing depth of roughly 1.4 cm somewhat better than last week. The area on the right foot is larger. We have been using silver alginate to all areas. The area on the right foot and left foot that we cultured last week showed both Klebsiella and Proteus. Both of these are quinolone sensitive. The patient put her's self on Bactrim and Flagyl that she had left hanging around from prior antibiotic usages. She was apparently on this last week when she arrived. I did not realize this. Unfortunately the Bactrim will not cover either 1 of these organisms. We will send in Cipro 500 twice daily for a week 03/04/2018; the patient has 2 wounds on the left foot one is the original wound which was a surgical wound for a deep DFU. At one point this had exposed bone. She still has an area over the fourth metatarsal head that probes about 1.4 cm although I think this is better than last week. I been using silver nitrate to try and promote tissue adherence and been using silver alginate here. She also has an area in the left midfoot. This has some depth but a small linear wound. Still requiring debridement. On the right midfoot is a circular wound. A lot of thick callus around this area. We have been using silver alginate to all wound areas She is completed the ciprofloxacin I gave her 2 weeks ago. 03/11/2018; the patient continues to have 2 open areas on the left foot 1 of which was the original surgical wound for a deep DFU. Only a small probing area remains although this is not much different from last week we have been using  silver alginate. The other area is on the midfoot this is smaller linear but still with some depth. We have been using silver alginate here as well On the right foot she has a small circular wound in the mid aspect. This is not much smaller than last time. We have been using silver alginate here as well 03/18/2018; she has 3 wounds on the left foot the original surgical wound, a very superficial wound in the mid aspect and then finally the area in the mid plantar foot. She arrives in today with a very concerning area in the wound in the mid plantar foot which is her most proximal wound. There is undermining here of roughly 1-1/2 cm superiorly. Serosanguineous drainage. She tells me she had some pain on for over the weekend that shot up her foot into her thigh and she tells me that she had a nodule in the groin area. She has the single wound in the right foot. We are using endoform to both wound areas 03/24/2018; the patient arrives with the original surgical wound in the area on the left midfoot about the same as last week. There is a collection of fluid under the surface of the skin extending from the surgical wound towards the midfoot although it does not reach the midfoot wound. The area on the right foot is about the same. Cultures  from last week of the left midfoot wound showed abundant Klebsiella abundant Enterococcus faecalis and moderate methicillin resistant staph I gave her Levaquin but this would have only covered the Klebsiella. She will need linezolid 04/01/2018; she is taking linezolid but for the first few days only took 1 a day. I have advised her to finish this at twice daily dosing. In any case all of her wounds are a lot better especially on the left foot. The original surgical wound is closed. The area on the left midfoot considerably smaller. The area on the right foot also smaller. 04/08/2018; her original surgical wound/osteomyelitis on the left foot remains closed. She has  area on the left foot that is in the midfoot area but she had some streaking towards this. This is not connected with her original wound at least not visually. Small wound on the right midfoot appears somewhat smaller. 04/15/18; both wounds looks better. Original wound is better left midfoot. Using silver alginate 1/21; patient states she uses saltwater soak in, stones or remove callus from around her wounds. She is also concerned about a blood blister she had on the left foot but it simply resolved on its own. We've been using silver alginate 1/28; the patient arrives today with the same streaking area from her metatarsals laterally [the site of her original surgical wound] down to the middle of her foot. There is some drainage in the subcutaneous area here. This concerns me that there is actually continued ongoing infection in the metatarsals probably the fourth and third. This fixates an MRI of the foot without contrast [chronic renal failure] The wound in the mid part of the foot is small but I wonder whether this area actually connects with the more distal foot. The area on the right midfoot is probably about the same. Callus thick skin around the small wound which I removed with a curette we have been using silver alginate on both wound areas 2/4; culture I did of the draining site on the left foot last time grew methicillin sensitive staph aureus. MRI of the left foot showed interval resolution of the findings surrounding the third metatarsal joint on the prior study consistent with treated osteomyelitis. Chronic soft tissue ulceration in the plantar and lateral aspect of the forefoot without residual focal fluid collection. No evidence of recurrent osteomyelitis. Noted to have the previous amputation of the distal first phalanx and fifth ray MRI of the right foot showed no evidence of osteomyelitis I am going to treat the patient with a prolonged course of antibiotics directed against  MSSA in the left foot 2/11; patient continues on cephalexin. She tells me she had nausea and vomiting over the weekend and missed 2 days. In general her foot looks much the same. She has a small open area just below the left fourth metatarsal head. A linear area in the left midfoot. Some discoloration extending from the inferior part of this into the left lateral foot although this appears to be superficial. She has a small area on the right midfoot which generally looks smaller after debridement 2/18; the patient is completing his cephalexin and has another 2 days. She continues to have open areas on the left and right foot. 2/25; she is now off antibiotics. The area on the left foot at the site of her original surgical wound has closed yet again. She still has open areas in the mid part of her foot however these appear smaller. The area on the right mid foot looks  about the same. We have been using silver alginate She tells me she had a serious hypoglycemic spell at home. She had to have EMS called and get IV dextrose 3/3; disappointing on the left lateral foot large area of necrotic tissue surrounding the linear area. This appears to track up towards the same original surgical wound. Required extensive debridement. The area on the right plantar foot is not a lot better also using silver 3/12; the culture I did last time showed abundant enterococcus. I have prescribed Augmentin, should cover any unrecognized anaerobes as well. In addition there were a few MRSA and Serratia that would not be well covered although I did not want to give her multiple antibiotics. She comes in today with a new wound in the right midfoot this is not connected with the original wound over her MTP a lot of thick callus tissue around both wounds but once again she said she is not walking on these areas 3/17-Patient comes in for follow-up on the bilateral plantar wounds, the right midfoot and the left plantar wound.  Both these are heavily callused surrounding the wounds. We are continuing to use silver alginate, she is compliant with offloading and states she uses a wheelchair fairly often at home 3/24; both wound areas have thick callus. However things actually look quite a bit better here for the majority of her left foot and the right foot. 3/31; patient continues to have thick callused somewhat irritated looking tissue around the wounds which individually are fairly superficial. There is no evidence of surrounding infection. We have been using silver alginate however I change that to [place] today 4/17; patient returns to clinic after having a scare with Covid she tested negative in her primary doctor's office. She has been using Hydrofera Blue. She does not have an open area on the right foot. On the left foot she has a small open area with the mid area not completely viable. She showed me pictures of what looks like a hemorrhagic blister from several days ago but that seems to have healed over this was on the lateral left foot 4/21; patient comes in to clinic with both her wounds on her feet closed. However over the weekend she started having pain in her right foot and leg up into the thigh. She felt as though she was running a low-grade fever but did not take her temperature. She took a doxycycline that she had leftover and yesterday a single Septra and metronidazole. She thinks things feel somewhat better. 4/28; duplex ultrasound I ordered last week was negative for DVT or superficial thrombophlebitis. She is completed the doxycycline I gave her. States she is still having a lot of pain in the right calf and right ankle which is no better than last week. She cannot sleep. She also states she has a temperature of up to 101, coughing and complaining of visual loss in her bilateral eyes. Apparently she was tested for Covid 2 weeks ago at [facility] and that was negative. Readmission: 09/03/18 patient  presents back for reevaluation after having been evaluated at the end of April regarding erythema and swelling of her right lower extremity. Subsequently she ended up going to the hospital on 07/29/18 and was admitted not to be discharged until 08/08/18. Unfortunately it was noted during the time that she was in the hospital that she did have methicillin-resistant Staphylococcus aureus as the infection noted at the site. It was also determined that she did have osteomyelitis which appears to  be fairly significant. She was treated with vancomycin and in fact is still on IV vancomycin at dialysis currently. This is actually slated to continue until 09/12/18 at least which will be the completion of the six weeks of therapy. Nonetheless based on what I'm seeing at this point I'm not sure she will be anywhere near ready to discontinue antibiotics at that time. Since she was released from the hospital she was seen by [name] who is her podiatrist on 08/27/18. His note specifically states that he is recommended that the patient needs of one knee amputation on the right as she has a life-threatening situation that can lead quickly to sepsis. The patient advised she would like to try to save her leg to which [name] apparently told her that this was against all medical advice. She also want to discontinue the Wound VAC which had been initiated due to the fact that she wasn't pleased with how the wound was looking and subsequently she wanted to pursue applying Medihoney at that time. He stated that he did not believe that the right lower extremity was salvageable and that the patient understood but would still like to attempt hyperbaric option therapy if it could be of any benefit. She was therefore referred back to [place] for further evaluation. He plans to see her back next week. Upon inspection today patient has a significant amount purulent drainage noted from the wound at this point. The bone in the distal  portion of her foot also appears to be extremely necrotic and spongy. When I push down on the bone it bubbles and seeps purulent drainage from deeper in the end of the foot. I do not think that this is likely going to heal very well at all and less aggressive surgical debridement were undertaken more than what I believe we can likely do here in our office. 09/12/2018; I have not seen this patient since the most recent hospitalization although she was in our clinic last week. I have reviewed some of her records from a complex hospitalization. 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 There is situation was complicated by MRSA bacteremia and acute on chronic renal failure now on dialysis. She is receiving vancomycin at dialysis. We started her on [place]'s wet-to- dry last week she is changing this daily. There is still purulent drainage coming out of her foot. Although she is apparently "agreeable" to a below-knee amputation which is been suggested by multiple clinicians she wants this to be done in [place]. She apparently has a telehealth visit with that provider sometime in late [place] 6/24. I have told her I think this is probably too long. Nevertheless I could not convince her to allow a local doctor to perform BKA. 09/19/2018; the patient has a large necrotic area on the right anterior foot. She has had previous transmetatarsal amputations. Culture I did last week showed MRSA nothing else she is on vancomycin at dialysis. She has continued leaking purulent drainage out of the distal part of the large circular wound on the right anterior foot. She apparently went to see Dr. 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 Somehow that translated into her being referred to plastic surgery for debridement of the area. I gather she basically refused amputation although I do not have a copy of Dr. Berenice Primas notes. The patient  really wants to have a trial of hyperbaric oxygen. I agreed with initial assessment in this clinic  that this was probably too far along to benefit however if she is going to have plastic surgery I think she would benefit from ancillary hyperbaric oxygen. The issue here is that the patient has benefited as maximally as any patient I have ever seen from hyperbaric oxygen therapy. Most recently she had exposed bone on the lateral part of her left foot after a surgical procedure and that actually has closed. She has eschared areas in both heels but no open area. She is remained systemically well. I am not optimistic that anything can be done about this but the patient is very clear that she wants an attempt. The attempt would include a wound VAC further debridements and hyperbaric oxygen along with IV antibiotics. 6/26; I put her in for a trial of hyperbaric oxygen only because of the dramatic response she has had with wounds on her left midfoot earlier this year which was a surgical wound that went straight to her bone over the metatarsal heads and also remotely the left third toe. We will see if we can get this through our review process and insurance. She arrives in clinic with again purulent material pouring out of necrotic bone on the top of the foot distally. There is also some concerning erythema on the front of the leg that we marked. It is bit difficult to tell how tender this is because of neuropathy. I note from infectious disease that she had her vancomycin extended. 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 She had the wound VAC on for part of the week. The rest of the time she is putting various things on this including Medihoney, "ionized water" silver sorb gel etc. 7/7; follow-up along with HBO. She is still on vancomycin at dialysis. She has a large open area on the dorsal right foot and a small dark eschar area on her heel. There is a lot less erythema  in the area and a lot less tenderness. From an infection point of view I think this is better. She still has a lot of necrosis in the remaining right forefoot [previous TMA] we are still using the wound VAC in this area 7/16; follow-up along with HBO. I put her on linezolid after she finished her vancomycin. We started this last Friday I gave her 2 weeks worth. I had the expectation that she would be operatively debrided by Dr. Marla Roe but that still has not happened yet. Patient phoned the office this week. She arrives for review today after HBO. The distal part of this wound is completely necrotic. Nonviable pieces of tendon bone was still purulent drainage. Also concerning that she has black eschar over the heel that is expanding. I think this may be indicative of infection in this area as well. She has less erythema and warmth in the ankle and calf but still an abnormal exam 7/21 follow-up along with HBO. I will renew her linezolid after checking a CBC with differential monitoring her blood counts especially her platelets. She was supposed to have surgery yesterday but if I am reading things correctly this was canceled after her blood sugar was found to be over 500. I thought Dr. Marla Roe who called me said that they were sending her to the ER but the patient states that was not the case. 7/28. Follow-up along with HBO. She is on linezolid I still do not have any lab work from dialysis even though I called last week. The patient is concerned about an  area on her left lateral foot about the level of the base of her fifth metatarsal. I did not really see anything that ominous here however this patient is in [place] ability to point out problems that she is sensing and she has been accurate in the past Finally she received a call from Dr. Marla Roe who is referring her to another orthopedic surgeon stating that she is too booked up to take her to the operating room now. Was still using a  wound VAC on the foot 8/3 -Follow-up after HBO, she is got another week of linezolid, she is to call ID for an appointment, x-rays of both feet were reviewed, the left foot x-ray with third MTP joint osteo- Right foot x-ray widespread osteo-in the right midfoot Right ankle x-ray does not show any active evidence of infection 8/11-Patient is seen after HBO, the wounds on the right foot appear to be about the same, the heel wound had some necrotic base over tendon that was debrided with a curette 8/21; patient is seen after HBO. The patient's wound on her dorsal foot actually looks reasonably good and there is substantial amount of epithelialization however the open area distally still has a lot of necrotic debris partially bone. I cannot really get a good sense of just how deep this probes under the foot. She has been pressuring me this week to order medical maggots through a company in [place] for her. The problem I have is there is not a defined wound area here. On the positive side there is no purulence. She has been to see infectious disease she is still on Septra DS although I have not had a chance to review their notes 8/28; patient is seen in conjunction with HBO. The wounds on her foot continued to improve including the right dorsal foot substantially the, the distal part of this wound and the area on the right heel. We have been using a wound VAC over this chronically. She is still on trimethoprim as directed by infectious disease 9/4; patient is seen in conjunction with HBO. Right dorsal foot wound substantially anteriorly is better however she continues to have a deep wound in the distal part of this that is not responding. We have been using silver collagen under border foam Area on the right plantar medial heel seems better. We have been using Hydrofera Blue 12/12/18 on evaluation today patient appears to be doing about the same with regard to her wound based on prior measurements.  She does have some necrotic tissue noted on the lateral aspect of the wound that is going require a little bit of sharp debridement today. This includes what appears to be potentially either severely necrotic bone or tendon. Nonetheless other than that she does not appear to have any severe infection which is good news 9/18; it is been 2 weeks since I saw this wound. She is tolerating HBO well. Continued dramatic improvement in the area on the right dorsal foot. She still has a small wound on the heel that we have been using Hydrofera Blue. She continues with a wound VAC 9/24; patient has to be seen emergently today with a swelling on her right lateral lower leg. She says that she told Dr. Evette Doffing about this and also myself on a couple of occasions but I really have no recollection of this. She is not systemically unwell and her wound really looked good the last time I saw this. She showed this to providers at dialysis and she was  able to verify that she was started on cephalexin today for 5 doses at dialysis. She dialyzes on Tuesday Thursday and Saturday. 10/2; patient is seen in conjunction with HBO. The area that is draining on the right anterior medial tibia is more extensive. Copious amounts of serosanguineous drainage with some purulence. We are still using the wound VAC on the original wound then it is stable. Culture I did of the original IandD showed MRSA I contacted dialysis she is now on vancomycin with dialysis treatments. I asked them to run a month 10/9; patient seen in conjunction with HBO. She had a new spontaneous open area just above the wound on the right medial tibia ankle. More swelling on the right medial tibia. Her wound on the foot looks about the same perhaps slightly better. There is no warmth spreading up her leg but no obvious erythema. her MRI of the foot and ankle and distal tib-fib is not booked for next Friday I discussed this with her in great detail over multiple  days. it is likely she has spreading infection upper leg at least involving the distal 25% above the ankle. She knows that if I refer her to orthopedics for infectious disease they are going to recommend amputation and indeed I am not against this myself. We had a good trial at trying to heal the foot which is what she wanted along with antibiotics debridement and HBO however she clearly has spreading infection [probably staph aureus/MRSA]. Nevertheless she once again tells me she wants to wait the left of the MRI. She still makes comments about having her amputation done in [place]. 10/19; arrives today with significant swelling on the lateral right leg. Last culture I did showed Klebsiella. Multidrug- resistant. Cipro was intermediate sensitivity and that is what I have her on pending her MRI which apparently is going to be done on Thursday this week although this seems to be moving back and forth. She is not systemically unwell. We are using silver alginate on her major wound area on the right medial foot and the draining areas on the right lateral lower leg 10/26; MRI showed extensive abscess in the anterior compartment of the right leg also widespread osteomyelitis involving osseous structures of the midfoot and portions of the hindfoot. Also suspicion for osteomyelitis anterior aspect of the distal medial malleolus. Culture I did of the purulence once again showed a multidrug-resistant Klebsiella. I have been in contact with nephrology late last week and she has been started on cefepime at dialysis to replace the vancomycin We sent a copy of her MRI report to Dr. Geroge Baseman in [place] who is an orthopedic surgeon. The patient takes great stock in his opinion on this. She says she will go to [place] to have her leg amputated if Dr. Geroge Baseman does not feel there is any salvage options. 11/2; she still is not talk to her orthopedic surgeon in [place]. Apparently he will call her at [phone]  this afternoon. The quality of this is she has not allowed me to refer her anywhere. She has been told over and over that she needs this amputated but has not agreed to be referred. She tells me her blood sugar was 600 last night but she has not been febrile. Electronic Signature(s) Signed: 02/02/2019 5:57:33 PM By: Linton Ham MD Entered By: Linton Ham on 02/02/2019 15:37:53 -------------------------------------------------------------------------------- Physical Exam Details Patient Name: Date of Service: [name] 02/02/2019 1:30 PM Medical Record Number:[id number] Patient Account Number: [id number] Date of Birth/Sex: Treating  RN: [birth year] (48 y.o. [name] Primary Care Provider: [name] Other Clinician: Referring Provider: Treating Provider/Extender:Ailene Royal, [name], [name] in Treatment: 21 Constitutional Patient is hypertensive.. Pulse regular and within target range for patient.Marland Kitchen Respirations regular, non-labored and within target range.. Temperature is normal and within the target range for the patient.Marland Kitchen Appears in no distress. Notes Wound exam; the original wound on the medial part of her foot really looks quite good except for necrotic debris in the inferior part of this. I debrided this with a #5 curette. We continue to make progress here. She had necrotic material coming out of one of the IandD sites I did on the abscess in the right lateral leg I remove this with pickups and a scalpel there is still sure purulent drainage coming out of this Electronic Signature(s) Signed: 02/02/2019 5:57:33 PM By: Linton Ham MD Entered By: Linton Ham on 02/02/2019 15:38:56 -------------------------------------------------------------------------------- Physician Orders Details Patient Name: Date of Service: [name] 02/02/2019 1:30 PM Medical Record Number:[id number] Patient Account Number: [id number] Date of  Birth/Sex: Treating RN: Nov 30, 1970 (48 y.o. [name] Primary Care Provider: [name] Other Clinician: Referring Provider: Treating Provider/Extender:Brinson Tozzi, [name], [name] in Treatment: 62 Verbal / Phone Orders: No Diagnosis Coding ICD-10 Coding Code Description [phone] Other chronic osteomyelitis, right ankle and foot L97.514 Non-pressure chronic ulcer of other part of right foot with necrosis of bone E10.621 Type 1 diabetes mellitus with foot ulcer L02.415 Cutaneous abscess of right lower limb Follow-up Appointments Return Appointment in 1 week. Dressing Change Frequency Wound #43 Right,Medial Foot Change dressing three times week. Wound #44 Right Calcaneus Change dressing three times week. Wound #46 Right,Lateral Lower Leg Change dressing three times week. Wound #47 Right,Anterior Lower Leg Change dressing three times week. Wound Cleansing Clean wound with Wound Cleanser - Anasept to all wounds Primary Wound Dressing Wound #43 Right,Medial Foot Calcium Alginate with Silver Wound #44 Right Calcaneus Calcium Alginate with Silver Wound #47 Right,Anterior Lower Leg Calcium Alginate with Silver Wound #46 Right,Lateral Lower Leg Calcium Alginate with Silver - do not pack, place over wound bed Secondary Dressing Wound #43 Right,Medial Foot Kerlix/Rolled [name] ABD pad Wound #47 Right,Anterior Lower Leg Kerlix/Rolled [name] ABD pad Wound #44 Right Calcaneus Kerlix/Rolled [name] Dry [name] Heel Cup Wound #46 Right,Lateral Lower Leg Kerlix/Rolled [name] ABD pad Edema Control Elevate legs to the level of the heart or above for 30 minutes daily and/or when sitting, a frequency of: - throughout the day [facility] for wound care. - Interim Electronic Signature(s) Signed: 02/02/2019 5:57:33 PM By: Linton Ham MD Signed: 02/02/2019 6:00:40 PM By: [name] RN, BSN Entered By: [name] on  02/02/2019 15:23:10 -------------------------------------------------------------------------------- Problem List Details Patient Name: Date of Service: [name] 02/02/2019 1:30 PM Medical Record Number:[id number] Patient Account Number: [id number] Date of Birth/Sex: Treating RN: [birth year] (48 y.o. [name] Primary Care Provider: [name] Other Clinician: Referring Provider: Treating Provider/Extender:Savalas Monje, [name], [name] in Treatment: 21 Active Problems ICD-10 Evaluated Encounter Code Description Active Date Today Diagnosis M86.671 Other chronic osteomyelitis, right ankle and foot 09/03/2018 No Yes L97.514 Non-pressure chronic ulcer of other part of right foot 09/03/2018 No Yes with necrosis of bone E10.621 Type 1 diabetes mellitus with foot ulcer 09/24/2018 No Yes L02.415 Cutaneous abscess of right lower limb 12/25/2018 No Yes Inactive Problems Resolved Problems Electronic Signature(s) Signed: 02/02/2019 5:57:33 PM By: Linton Ham MD Entered By: Linton Ham on 02/02/2019 15:36:24 -------------------------------------------------------------------------------- Progress Note Details  Patient Name: Date of Service: Nancy, [name] 02/02/2019 1:30 PM Medical Record Number:[id number] Patient Account Number: [id number] Date of Birth/Sex: Treating RN: 06/26/1970 (48 y.o. [name] Primary Care Provider: Other Clinician: [name] Referring Provider: Treating Provider/Extender:Christena Sunderlin, [name], [name] in Treatment: 21 Subjective History of Present Illness (HPI) 48 year old diabetic who is known to have type 1 diabetes which is poorly controlled last hemoglobin A1c was 11%. She comes in with a ulcerated area on the left lateral foot which has been there for over 6 months. Was recently she has been treated by [name] of podiatry who saw her last on 05/28/2016. Review of his notes revealed that the patient had  incision and drainage with placement of antibiotic beads to the left foot on 04/11/2016 for possible osteomyelitis of the cuboid bone. Over the last year she's had a history of amputation of the left fifth toe and a femoropopliteal popliteal bypass graft somewhere in April 2017. 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 His note [name] mentions that the patient has been referred to me for further wound care and possibly great candidate for hyperbaric oxygen therapy due to recurrent osteomyelitis. However we do not have any x-rays of biopsy reports confirming this. He has been on several antibiotics including Bactrim and most recently is on doxycycline for an MRSA. I understand, the patient was not a candidate for IV antibiotics as she has had previous PICC lines which resulted in blood clots in both arms. There was a x-ray report dated 04/04/2016 on [name] notes which showed evidence of fifth ray resection left foot with osteolytic changes noted to the fourth metatarsal and cuboid bone on the left. 06/13/2016 -- had a left foot x-ray which showed no acute fracture or dislocation and no definite radiographic evidence of osteomyelitis. Advanced osteopenia was seen. 06/20/2016 -- she has noticed a new wound on the right plantar foot in the region where she had a callus before. 06/27/16- the patient did have her x-ray of the right foot which showed no findings to suggest osteomyelitis. She saw her endocrinologist, [name], yesterday. Her A1c in January was 11. He also indicates mismanagement and noncompliance regarding her diabetes. She is currently on Bactrim for a lip infection. She is complaining of nausea, vomiting and diarrhea. She is unable to articulate the exact orders or dosing of the Bactrim; it is unclear when she will complete this. 07/04/2016 -- results from [facility] of ABIs with ankle waveforms were noted from 02/14/2016. The examination done on 06/27/2015  showed noncompressible ABIs with the right being 1.45 and the left being 1.33. The present examination showed a right ABI of 1.19 on the left of 1.33. The conclusion was that right normal ABI in the lower extremity at rest however compared to previous study which was noncompressible ABI may be falsely elevated side suggesting medial calcification. The left ABI suggested medial calcification. 08/01/2016 -- the patient had more redness and pain on her right foot and did not get to come to see as noted she see her PCP or go to the ER and decided to take some leftover metronidazole which she had at home. As usual, the patient does report she feels and is rather noncompliant. 08/08/2016 -- -- x-ray of the right foot -- FINDINGS:Transmetatarsal amputation is noted. No bony destruction is noted to suggest osteomyelitis. IMPRESSION: No evidence of osteomyelitis. Postsurgical changes are seen. MRI would be more sensitive for possible bony changes. Culture has grown Serratia Marcescens --  sensitive to Bactrim, ciprofloxacin, ceftazidime she was seen by [name] on 08/06/2016. He did not find any exposed bone, muscle, tendon, ligament or joint. There was no malodor and he did a excisional debridement in the office. ============ Old notes: 48 year old patient who is known to the wound clinic for a while had been away from the wound clinic since 09/01/2014. Over the last several months she has been admitted to various hospitals including [place] [facility]. She was treated for a right metatarsal osteomyelitis with a transmetatarsal amputation and this was done about 2 months ago. He has a small ulcerated area on the right heel and she continues to have an ulcerated area on the left plantar aspect of the foot. The patient was recently admitted to the [facility] group between 7/12 and 10/18/2014. she was given 3 weeks of IV vancomycin and was to follow-up with her surgeons at [facility] and also took oral vancomycin for C. difficile colitis. 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 MRI of the left foot done on 10/14/2014 showed no abscess or osteomyelitis. 04/27/15; this is a patient we know from previous stays in the wound care center. She is a type I diabetic I am not sure of her control currently. Since the last time I saw her she is had a right transmetatarsal amputation and has no wounds on her right foot and has no open wounds. She is been followed at the wound care center at [facility] in [place]. She comes today with the desire to undergo hyperbaric treatment locally. Apparently one of her wound care providers in [place] has suggested hyperbarics. This is in response to an MRI from 04/18/15 that showed increased marrow signal and loss of the proximal fifth metatarsal cortex evidence of osteomyelitis with likely early osteomyelitis in the cuboid bone as well. She has a large wound over the base of the fifth metatarsal. She also has a eschar over her the tips of her toes on 1,3 and 5. She does not have peripheral pulses and apparently is going for an angiogram tomorrow which seems reasonable. After this she is going to infectious disease at [facility]. They have been using Medihoney to the large wound on the lateral aspect of the left foot to. The patient has known Charcot deformity from diabetic neuropathy. She also has known diabetic PAD. Surprisingly I can't see that she has had any recent antibiotics, the patient states the last antibiotic she had was at the end of November for 10 days. I think this was in response to culture that showed group G strep although I'm not  exactly sure where the culture was from. She is also had arterial studies on 03/29/15. This showed a right ABI of 1.4 that was noncompressible. Her left ABI was 0.73. 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 It was not felt that arterial inflow was adequate for healing of a foot ulcer. Her Doppler waveforms looked monophasic ===== READMISSION 02/28/17; this is in an now 48 year old woman we've had at several different occasions in this clinic. 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 She has a remote ex-smoker. She was last seen in this clinic by Dr. Con Memos I think in May. More recently  she is been followed by her podiatrist [name] an infectious disease Dr. Megan Salon. She has 2 open wounds the major one is over the right first metatarsal head she also has a wound on the left plantar foot. an MRI of the right foot on 01/01/17 showed a soft tissue ulcer along the plantar aspect of the first metatarsal base consistent with osteomyelitis of the first metatarsal stump. Dr. 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 According to the patient this is been open for slightly over a month. She has been on a combination of Cipro 500 twice a day, Zyvox 600 twice a day and Flagyl 500 3 times a day for over a month now as directed by Dr. Megan Salon. cultures of the right foot earlier this year showed MRSA in January and Serratia in May. January also had a few viridans strep. Recent x-rays of both feet were done and [name] office and I don't have these reports. The patient has known PAD and has a history of aleft femoropopliteal bypass in April 2017. 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 Dwyane Dee. In spite of this the last hemoglobin A1c I can see is  10.1 on 01/01/2017. She is being referred by 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 She is been using Medihoney to this area and also an area on the left midfoot. She is using healing sandals bilaterally. ABIs in this clinic at the left posterior tibial was 1.1 noncompressible on the right READMISSION Non invasive vascular NOVANT 5/18 Aftercare following surgery of the circulatory system Procedure Note - Interface, External Ris In - 08/13/2016 11:05 AM EDT Procedure: Examination consists of physiologic resting arterial pressures of the brachial and ankle arteries bilaterally with continuous wave Doppler waveform analysis. Previous: Previous exam performed on 02/14/16 demonstrated ABIs of Rt = 1.19 and Lt = 1.33. Right: ABI = non-compressible PT, 1.47 DP. S/P transmet amputation. Left: ABI = 1.52, 2nd digit pressure = 87 mmHg Conclusions: Right: ABI (>1.3) may be falsely elevated, suggesting medial calcification. Left: ABI (>1.3) may be falsely elevated, suggesting medial calcification The patient is a now 48 year old type I diabetic is had multiple issues her graded to chronic diabetic foot ulcers. 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 We had her in the clinic lastin November. At that point she had wounds on her bilateral feet.she had wanted to try hyperbarics however the healogics review process denied her because she hadn't followed up with her vascular surgeon for her left femoropopliteal bypass. The bypass was done by Dr. Raul Del at [facility]. We made her a follow-up with Dr. Raul Del however she did not keep the appointment and therefore she was not approved The patient shows me a small wound on her left fourth metatarsal head on her phone. She developed rapid discoloration  in the plantar aspect of the left foot and she was admitted to hospital from 2/2 through 05/10/17 with wet gangrene of the left foot osteomyelitis of the fourth metatarsal heads. She was admitted acutely ill with a temperature of 103. She was started on broad-spectrum vancomycin and cefepime. On 05/06/17 she was taken to the OR by [name] her  podiatric surgeon for an incision and drainage irrigation of the left foot wound. Cultures from this surgery revealed group be strep and anaerobes. she was seen by Dr.Xu of orthopedic surgery and scheduled for a below-knee amputation which she u refused. Ultimately she was discharged on Levaquin and Flagyl for one month. MRI 05/05/17 done while she was in the hospital showed abscess adjacent to the fourth metatarsal head and neck small abscess around the fourth flexor tendon. Inflammatory phlegmon and gas in the soft tissues along the lateral aspect of the fourth phalanx. Findings worrisome for osteomyelitis involving the fourth proximal and middle phalanx and also the third and fourth metatarsals. Finally the patient had actually shortly before this followed up with Dr. Raul Del at no time on 04/29/17. He felt that her left femoropopliteal bypass was patent he felt that her left-sided toe pressures more than adequate for healing a wound on the left foot. This was before her acute presentation. 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 05/28/17; she is started hyperbarics. The patient tells me that for some reason she was not actually on Levaquin but I think on ciprofloxacin. She was on Flagyl. She only started her Levaquin yesterday due to some difficulty with the pharmacy and perhaps her sister picking it up. She has an appointment with [name] tomorrow and with infectious disease early next week. She has no new complaints 06/06/17; the patient continues in hyperbarics. She saw [name] on 05/29/17 who is her podiatric surgeon. He is elected for a transmetatarsal  amputation on 06/27/17. I'm not sure at what level he plans to do this amputation. The patient is unaware ooShe also saw Dr. Megan Salon of infectious disease who elected to continue her on current antibiotics I think this is ciprofloxacin and Flagyl. I'll need to clarify with her tomorrow if she actually has this. We're using silver alginate to the actual wound. Necrotic surface today with material under the flap of her foot. ooOriginal MRI showed abscesses as well as osteomyelitis of the proximal and middle fourth phalanx and the third and fourth metatarsal heads 06/11/17; patient continues in hyperbarics and continues on oral antibiotics. She is doing well. The wound looks better. The necrotic part of this under the flap in her superior foot also looks better. she is been to see [name]. I haven't had a chance to look at his note. Apparently he has put the transmetatarsal amputation on hold her request it is still planning to take her to the OR for debridement and product application ACEL. I'll see if I can find his note. I'll therefore leave product ordering/requests to [name] for now. I was going to look at Dermagraft 06/18/17-she is here in follow-up evaluation for bilateral foot wounds. She continues with hyperbaric therapy. She states she has been applying manuka honey to the right plantar foot and alternate manuka honey and silver alginate to the left foot, despite our orders. We will continue with same treatment plan and she will follo up next week. 06/25/17; I have reviewed [name] last note from 3/11. She has operative debridement in 2 days' time. By review his note apparently they're going to place there is skin over the majority of this wound which is a good choice. She has a small satellite area at the most proximal part of this wound on the left plantar foot. The area on the right plantar foot we've been using silver alginate and it is close to healing. 07/02/17; unfortunately the  patient was not easily  approved for [name] proposed surgery. I'm not completely certain what the issue is. She has been using silver alginate to the wound she has completed a first course of hyperbarics. She is still on Levaquin and Flagyl. I have really lost track of the time course here.I suspect she should have another week to 2 of antibiotics. I'll need to see if she is followed up with infectious disease Dr. Megan Salon 07/09/17; the patient is followed up with Dr. Megan Salon. 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 She continues on Levaquin and metronidazole continuing both of these for now I think she is been on fr about 6 weeks. She still has some drainage but no pain. No fever. Her had been plans for her to go to the OR for operative debridement with her podiatrist [name], I am not exactly sure where that is. I'll probably slip a note to [name] today. I note that she follows with Dr. Dwyane Dee of endocrinology. We have her recertified for hyperbaric oxygen. I have not heard about Dermagraft however I'll see if [name] is planning a skin substitute as well 07/16/17; the patient tells me she is just about out of [place]. I'll need to check Dr. Hale Bogus last notes on this. She states she has plenty of Flagyl however. She comes in today complaining of pain in the right lateral foot which she said lasted for about a day. The wound on the right foot is actually much more medially. She also tells me that the [facility] cost a lot of pain in the left foot wound and she turned back to silver alginate. Finally Dermagraft has a $161 per application co-pay. She cannot afford this 07/23/17; patient arrives today with the wound not much smaller. There is not much new to add. She has not heard from [name] all try to put in a call to them today. She was asking about Dermagraft again and she has an over $096 per application co-pay she states that she  would be willing to try to do a payment plan. I been tried to avoid this. We've been using silver alginate, I'll change to [facility] 07/30/17-She is here in follow-up evaluation for left foot ulcer. She continues hyperbaric medicine. The left foot ulcer is stable we will continue with same treatment plan 08/06/17; she is here for evaluation of her left foot ulcer. Currently being treated for hyperbarics or underlying osteomyelitis. She is completed antibiotics. The left foot ulcer is better smaller with healthier looking granulation. For various reasons I am not really clear on we never got her back to the OR with [name]. He did not respond to my secure text message. Nevertheless I think that surgery on this point is not necessary nor am I completely clear that a skin substitute is necessary The patient is complaining about pain on the outside of her right foot. 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 There is no erythema. She also states the foot is warm versus her other part of her upper leg and this is largely true. It is not totally clear to me what's causing this. She thinks it's different from her usual neuropathy pain 08/13/17; she arrives in clinic today with a small wound which is superficial on her right first metatarsal head. 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 She tells [place] she was up on her feet over the Mother's Day celebration. ooThe large wound is  on the left foot. Continues with hyperbarics for underlying osteomyelitis. We're using Hydrofera Blue. She asked me today about where we were with Dermagraft. I had actually excluded this because of the co- pay however she wants to assume this therefore I'll recheck the co-pay an order for next week. 08/20/17; the patient agreed to accept the co-pay of the first Apligraf which we applied today. She is disappointed she is finishing hyperbarics will run this through the insurance on the extent of  the foot infection and the extent of the wound that she had however she is already had 60 dive's. Dermagraft No. 1 08/27/17; Dermagraft No. 2. She is not eligible for any more hyperbaric treatments this month. She reports a fair amount of drainage and she actually changed to the external dressings without disturbing the direct contact layer 09/03/17; the patient arrived in clinic today with the wound superficially looking quite healthy. Nice vibrant red tissue with some advancing epithelialization although not as much adherence of the flap as I might like. However she noted on her own fourth toe some bogginess and she brought that to our attention. Indeed this was boggy feeling like a possibility of subcutaneous fluid. 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 She is not been unwell. We've been using Dermagraft 09/10/17; the culture that I did not last week was MRSA. She saw Dr. Megan Salon this morning who is going to start her on vancomycin. I had sent him a secure a text message yesterday. I also spoke with her podiatric surgeon [name] about surgery on this foot the options for conserving a functional foot etc. Promised me he would see her and will make back consultation today. Paradoxically her actual wound on the plantar aspect of her left foot looks really quite good. I had given her 5 days worth of Baxdella to cover her for MRSA. Her MRI came back showing osteomyelitis within the third metatarsal shaft and head and base of the third and fourth proximal phalanx. She had extensive inflammatory changes throughout the soft tissue of the lateral forefoot. With an ill-defined fluid around the fourth metatarsal extending into the plantar and dorsal soft tissues 09/19/17; the patient is actually on oral Septra and Flagyl. She apparently refused IV vancomycin. She also saw [name] at my request who is planning her for a left BKA  sometime in mid July. MRI showed osteomyelitis within the third metatarsal shaft and head and the basis of the third and fourth proximal phalanx. I believe there was felt to be possible septic arthritis involving the third MTP. 09/26/17; the patient went back to Dr. Megan Salon at my suggestion and is now receiving IV daptomycin. Her wound continues to look quite good making the decision to proceed with a transmetatarsal amputation although more difficult for the patient. I believe in my extensive discussions with her she has a good sense of the pros and cons of this. I don't NV the tuft decision she has to make. She has an appointment with [name] I believe in mid July and I previously spoken to him about this issue Has we had used 3 previous Dermagraft. Given the condition of the wound surface I went ahead and added the fourth one today area and I did this not fully realizing that she'll be traveling to [place] next week. I'm hopeful she can come back in 2 weeks 10/21/17; Her same Dermagraft on for  about 3-1/2 weeks. In spite of this the wound arrives looking quite healthy. There is been a lot of healing dimensions are smaller. Looking at the square shaped wound she has now there is some undermining and some depth medially under the undermining although I cannot palpate any bone. No surrounding infection is obvious. She has difficult questions about how to look at this going forward vis--vis amputations versus continued medical therapy. To be truthful the wound is looks so healthy and it is continued to contract. Hard to justify foot surgery at this point although I still told her that I think it might come to that if we are not able to eradicate the underlying MRSA. She is still highly at risk and she understands this 11/06/17 on evaluation today patient appears to be doing better in regard to her foot ulcer. She's been tolerating the dressing changes without complication. Currently she is here  for her Dermagraft #6. Her wound continues to make excellent progress at this point. She does not appear to have any evidence of infection which is good news. 11/13/17 on evaluation today patient appears to be doing excellent at this time. She is here for repeat Dermagraft application. This is #7. Overall her wound seems to be making great progress. 12/05/17; the patient arrives with the wound in much better condition than when I last saw this almost 6 weeks ago. She still has a small probing area in the left metatarsal head region on the lateral aspect of her foot. We applied her last Dermagraft today. ooSince the last time she is here she has what appears to a been a blood blister on the plantar aspect of left foot although I don't see this is threatening. There is also a thick raised tissue on the right mid metatarsal head region. This was not there I don't think the last time she was here 3 weeks ago. 12/12/17; the patient continues to have a small programming area in the left metatarsal head region on the lateral aspect of her foot which was the initial large surgical wound. I applied her last Apligraf last week. I'm going to use Endoform starting today ooUnfortunately she has an excoriated area in the left mid foot and the right mid foot. The left midfoot looks like a blistered area this was not opened last week it certainly is open today. Using silver alginate on these areas. She promises me she is offloading this. 12/19/17; the small probing area in the left metatarsal head eyes think is shallower. In general her original wound looks better. We've been using Endoform. The area inferiorly that I think was trauma last week still requires debridement a lot of nonviable surface which I removed. She still has an open open area distally in her foot ooSimilarly on the right foot there is tightly adherent surface debris which I removed. Still areas that don't look completely epithelialized. This is  a small open area. We used silver alginate on these areas 12/26/2017; the patient did not have the supplies we ordered from last week including the Endoform. The original large wound on the left lateral foot looks healthy. She still has the undermining area that is largely unchanged from last week. She has the same heavily callused raised edged wounds on the right mid and left midfoot. Both of these requiring debridement. We have been using silver alginate on these areas 01/02/2018; there is still supply issues. We are going to try to use Prisma but I am not sure she actually  got it from what she is saying. She has a new open area on the lateral aspect of the left fourth toe [previous fifth ray amputation]. Still the one tunneling area over the fourth metatarsal head. The area is in the midfoot bilaterally still have thick callus around them. She is concerned about a raised swelling on the lateral aspect of the foot. However she is completely insensate 01/10/2018; we are using Prisma to the wounds on her bilateral feet. Surprisingly the tunneling area over the left fourth metatarsal head that was part of her original surgery has closed down. She has a small open area remaining on the incision line. 2 open areas in the midfoot. 02/10/2018; the patient arrives back in clinic after a month hiatus. She was traveling to visit family in [place]. Is fairly clear she was not offloading the areas on her feet. The original wound over the left lateral foot at the level of metatarsal heads is reopened and probes medially by about a centimeter or 2. She notes that a week ago she had purulent drainage come out of an area on the left midfoot. Paradoxically the worst area is actually on the right foot is extensive with purulent drainage. We will use silver alginate today 02/17/2018; the patient has 3 wounds one over the left lateral foot. She still has a small area over the metatarsal heads which is the remnant of  her original surgical wound. This has medial probing depth of roughly 1.4 cm somewhat better than last week. The area on the right foot is larger. We have been using silver alginate to all areas. The area on the right foot and left foot that we cultured last week showed both Klebsiella and Proteus. Both of these are quinolone sensitive. The patient put her's self on Bactrim and Flagyl that she had left hanging around from prior antibiotic usages. She was apparently on this last week when she arrived. I did not realize this. Unfortunately the Bactrim will not cover either 1 of these organisms. We will send in Cipro 500 twice daily for a week 03/04/2018; the patient has 2 wounds on the left foot one is the original wound which was a surgical wound for a deep DFU. At one point this had exposed bone. She still has an area over the fourth metatarsal head that probes about 1.4 cm although I think this is better than last week. I been using silver nitrate to try and promote tissue adherence and been using silver alginate here. ooShe also has an area in the left midfoot. This has some depth but a small linear wound. Still requiring debridement. ooOn the right midfoot is a circular wound. A lot of thick callus around this area. ooWe have been using silver alginate to all wound areas ooShe is completed the ciprofloxacin I gave her 2 weeks ago. 03/11/2018; the patient continues to have 2 open areas on the left foot 1 of which was the original surgical wound for a deep DFU. Only a small probing area remains although this is not much different from last week we have been using silver alginate. The other area is on the midfoot this is smaller linear but still with some depth. We have been using silver alginate here as well ooOn the right foot she has a small circular wound in the mid aspect. This is not much smaller than last time. We have been using silver alginate here as well 03/18/2018; she has 3  wounds on the left foot the  original surgical wound, a very superficial wound in the mid aspect and then finally the area in the mid plantar foot. She arrives in today with a very concerning area in the wound in the mid plantar foot which is her most proximal wound. There is undermining here of roughly 1-1/2 cm superiorly. Serosanguineous drainage. She tells me she had some pain on for over the weekend that shot up her foot into her thigh and she tells me that she had a nodule in the groin area. ooShe has the single wound in the right foot. ooWe are using endoform to both wound areas 03/24/2018; the patient arrives with the original surgical wound in the area on the left midfoot about the same as last week. There is a collection of fluid under the surface of the skin extending from the surgical wound towards the midfoot although it does not reach the midfoot wound. The area on the right foot is about the same. Cultures from last week of the left midfoot wound showed abundant Klebsiella abundant Enterococcus faecalis and moderate methicillin resistant staph I gave her Levaquin but this would have only covered the Klebsiella. She will need linezolid 04/01/2018; she is taking linezolid but for the first few days only took 1 a day. I have advised her to finish this at twice daily dosing. In any case all of her wounds are a lot better especially on the left foot. The original surgical wound is closed. The area on the left midfoot considerably smaller. The area on the right foot also smaller. 04/08/2018; her original surgical wound/osteomyelitis on the left foot remains closed. She has area on the left foot that is in the midfoot area but she had some streaking towards this. This is not connected with her original wound at least not visually. ooSmall wound on the right midfoot appears somewhat smaller. 04/15/18; both wounds looks better. Original wound is better left midfoot. Using silver  alginate 1/21; patient states she uses saltwater soak in, stones or remove callus from around her wounds. She is also concerned about a blood blister she had on the left foot but it simply resolved on its own. We've been using silver alginate 1/28; the patient arrives today with the same streaking area from her metatarsals laterally [the site of her original surgical wound] down to the middle of her foot. There is some drainage in the subcutaneous area here. This concerns me that there is actually continued ongoing infection in the metatarsals probably the fourth and third. This fixates an MRI of the foot without contrast [chronic renal failure] ooThe wound in the mid part of the foot is small but I wonder whether this area actually connects with the more distal foot. ooThe area on the right midfoot is probably about the same. Callus thick skin around the small wound which I removed with a curette we have been using silver alginate on both wound areas 2/4; culture I did of the draining site on the left foot last time grew methicillin sensitive staph aureus. MRI of the left foot showed interval resolution of the findings surrounding the third metatarsal joint on the prior study consistent with treated osteomyelitis. Chronic soft tissue ulceration in the plantar and lateral aspect of the forefoot without residual focal fluid collection. No evidence of recurrent osteomyelitis. Noted to have the previous amputation of the distal first phalanx and fifth ray MRI of the right foot showed no evidence of osteomyelitis I am going to treat the patient with a prolonged  course of antibiotics directed against MSSA in the left foot 2/11; patient continues on cephalexin. She tells me she had nausea and vomiting over the weekend and missed 2 days. In general her foot looks much the same. She has a small open area just below the left fourth metatarsal head. A linear area in the left midfoot. Some discoloration  extending from the inferior part of this into the left lateral foot although this appears to be superficial. She has a small area on the right midfoot which generally looks smaller after debridement 2/18; the patient is completing his cephalexin and has another 2 days. She continues to have open areas on the left and right foot. 2/25; she is now off antibiotics. The area on the left foot at the site of her original surgical wound has closed yet again. She still has open areas in the mid part of her foot however these appear smaller. The area on the right mid foot looks about the same. We have been using silver alginate She tells me she had a serious hypoglycemic spell at home. She had to have EMS called and get IV dextrose 3/3; disappointing on the left lateral foot large area of necrotic tissue surrounding the linear area. This appears to track up towards the same original surgical wound. Required extensive debridement. The area on the right plantar foot is not a lot better also using silver 3/12; the culture I did last time showed abundant enterococcus. I have prescribed Augmentin, should cover any unrecognized anaerobes as well. In addition there were a few MRSA and Serratia that would not be well covered although I did not want to give her multiple antibiotics. She comes in today with a new wound in the right midfoot this is not connected with the original wound over her MTP a lot of thick callus tissue around both wounds but once again she said she is not walking on these areas 3/17-Patient comes in for follow-up on the bilateral plantar wounds, the right midfoot and the left plantar wound. Both these are heavily callused surrounding the wounds. We are continuing to use silver alginate, she is compliant with offloading and states she uses a wheelchair fairly often at home 3/24; both wound areas have thick callus. However things actually look quite a bit better here for the majority of  her left foot and the right foot. 3/31; patient continues to have thick callused somewhat irritated looking tissue around the wounds which individually are fairly superficial. There is no evidence of surrounding infection. We have been using silver alginate however I change that to [facility] today 4/17; patient returns to clinic after having a scare with Covid she tested negative in her primary doctor's office. She has been using Hydrofera Blue. She does not have an open area on the right foot. On the left foot she has a small open area with the mid area not completely viable. She showed me pictures of what looks like a hemorrhagic blister from several days ago but that seems to have healed over this was on the lateral left foot 4/21; patient comes in to clinic with both her wounds on her feet closed. However over the weekend she started having pain in her right foot and leg up into the thigh. She felt as though she was running a low-grade fever but did not take her temperature. She took a doxycycline that she had leftover and yesterday a single Septra and metronidazole. She thinks things feel somewhat better. 4/28; duplex  ultrasound I ordered last week was negative for DVT or superficial thrombophlebitis. She is completed the doxycycline I gave her. States she is still having a lot of pain in the right calf and right ankle which is no better than last week. She cannot sleep. She also states she has a temperature of up to 101, coughing and complaining of visual loss in her bilateral eyes. Apparently she was tested for Covid 2 weeks ago at [facility] and that was negative. Readmission: 09/03/18 patient presents back for reevaluation after having been evaluated at the end of April regarding erythema and swelling of her right lower extremity. Subsequently she ended up going to the hospital on 07/29/18 and was admitted not to be discharged until 08/08/18. Unfortunately it was noted during the time that  she was in the hospital that she did have methicillin-resistant Staphylococcus aureus as the infection noted at the site. It was also determined that she did have osteomyelitis which appears to be fairly significant. She was treated with vancomycin and in fact is still on IV vancomycin at dialysis currently. This is actually slated to continue until 09/12/18 at least which will be the completion of the six weeks of therapy. Nonetheless based on what I'm seeing at this point I'm not sure she will be anywhere near ready to discontinue antibiotics at that time. Since she was released from the hospital she was seen by [name] who is her podiatrist on 08/27/18. His note specifically states that he is recommended that the patient needs of one knee amputation on the right as she has a life-threatening situation that can lead quickly to sepsis. The patient advised she would like to try to save her leg to which [name] apparently told her that this was against all medical advice. She also want to discontinue the Wound VAC which had been initiated due to the fact that she wasn't pleased with how the wound was looking and subsequently she wanted to pursue applying Medihoney at that time. He stated that he did not believe that the right lower extremity was salvageable and that the patient understood but would still like to attempt hyperbaric option therapy if it could be of any benefit. She was therefore referred back to [place] for further evaluation. He plans to see her back next week. Upon inspection today patient has a significant amount purulent drainage noted from the wound at this point. The bone in the distal portion of her foot also appears to be extremely necrotic and spongy. When I push down on the bone it bubbles and seeps purulent drainage from deeper in the end of the foot. I do not think that this is likely going to heal very well at all and less aggressive surgical debridement were undertaken more  than what I believe we can likely do here in our office. 09/12/2018; I have not seen this patient since the most recent hospitalization although she was in our clinic last week. I have reviewed some of her records from a complex hospitalization. 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 There is situation was complicated by MRSA bacteremia and acute on chronic renal failure now on dialysis. She is receiving vancomycin at dialysis. We started her on Dakin's wet-to- dry last week she is changing this daily. There is still purulent drainage coming out of her foot. Although she is apparently "agreeable" to a below-knee amputation which is been suggested by multiple clinicians she wants this to  be done in [place]. She apparently has a telehealth visit with that provider sometime in late [place] 6/24. I have told her I think this is probably too long. Nevertheless I could not convince her to allow a local doctor to perform BKA. 09/19/2018; the patient has a large necrotic area on the right anterior foot. She has had previous transmetatarsal amputations. Culture I did last week showed MRSA nothing else she is on vancomycin at dialysis. She has continued leaking purulent drainage out of the distal part of the large circular wound on the right anterior foot. She apparently went to see Dr. 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 Somehow that translated into her being referred to plastic surgery for debridement of the area. I gather she basically refused amputation although I do not have a copy of Dr. Berenice Primas notes. The patient really wants to have a trial of hyperbaric oxygen. I agreed with initial assessment in this clinic that this was probably too far along to benefit however if she is going to have plastic surgery I think she would benefit from ancillary hyperbaric oxygen. The issue here is that the patient has benefited as  maximally as any patient I have ever seen from hyperbaric oxygen therapy. Most recently she had exposed bone on the lateral part of her left foot after a surgical procedure and that actually has closed. She has eschared areas in both heels but no open area. She is remained systemically well. I am not optimistic that anything can be done about this but the patient is very clear that she wants an attempt. The attempt would include a wound VAC further debridements and hyperbaric oxygen along with IV antibiotics. 6/26; I put her in for a trial of hyperbaric oxygen only because of the dramatic response she has had with wounds on her left midfoot earlier this year which was a surgical wound that went straight to her bone over the metatarsal heads and also remotely the left third toe. We will see if we can get this through our review process and insurance. She arrives in clinic with again purulent material pouring out of necrotic bone on the top of the foot distally. There is also some concerning erythema on the front of the leg that we marked. It is bit difficult to tell how tender this is because of neuropathy. I note from infectious disease that she had her vancomycin extended. 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 She had the wound VAC on for part of the week. The rest of the time she is putting various things on this including Medihoney, "ionized water" silver sorb gel etc. 7/7; follow-up along with HBO. She is still on vancomycin at dialysis. She has a large open area on the dorsal right foot and a small dark eschar area on her heel. There is a lot less erythema in the area and a lot less tenderness. From an infection point of view I think this is better. She still has a lot of necrosis in the remaining right forefoot [previous TMA] we are still using the wound VAC in this area 7/16; follow-up along with HBO. I put her on linezolid after she finished her  vancomycin. We started this last Friday I gave her 2 weeks worth. I had the expectation that she would be operatively debrided by Dr. Marla Roe but that still has not happened yet. Patient phoned the office this week. She arrives for review today after HBO.  The distal part of this wound is completely necrotic. Nonviable pieces of tendon bone was still purulent drainage. Also concerning that she has black eschar over the heel that is expanding. I think this may be indicative of infection in this area as well. She has less erythema and warmth in the ankle and calf but still an abnormal exam 7/21 follow-up along with HBO. I will renew her linezolid after checking a CBC with differential monitoring her blood counts especially her platelets. She was supposed to have surgery yesterday but if I am reading things correctly this was canceled after her blood sugar was found to be over 500. I thought Dr. Marla Roe who called me said that they were sending her to the ER but the patient states that was not the case. 7/28. Follow-up along with HBO. She is on linezolid I still do not have any lab work from dialysis even though I called last week. The patient is concerned about an area on her left lateral foot about the level of the base of her fifth metatarsal. I did not really see anything that ominous here however this patient is in [place] ability to point out problems that she is sensing and she has been accurate in the past Finally she received a call from Dr. Marla Roe who is referring her to another orthopedic surgeon stating that she is too booked up to take her to the operating room now. Was still using a wound VAC on the foot 8/3 -Follow-up after HBO, she is got another week of linezolid, she is to call ID for an appointment, x-rays of both feet were reviewed, the left foot x-ray with third MTP joint osteo- Right foot x-ray widespread osteo-in the right midfoot Right ankle x-ray does not show any  active evidence of infection 8/11-Patient is seen after HBO, the wounds on the right foot appear to be about the same, the heel wound had some necrotic base over tendon that was debrided with a curette 8/21; patient is seen after HBO. The patient's wound on her dorsal foot actually looks reasonably good and there is substantial amount of epithelialization however the open area distally still has a lot of necrotic debris partially bone. I cannot really get a good sense of just how deep this probes under the foot. She has been pressuring me this week to order medical maggots through a company in [place] for her. The problem I have is there is not a defined wound area here. On the positive side there is no purulence. She has been to see infectious disease she is still on Septra DS although I have not had a chance to review their notes 8/28; patient is seen in conjunction with HBO. The wounds on her foot continued to improve including the right dorsal foot substantially the, the distal part of this wound and the area on the right heel. We have been using a wound VAC over this chronically. She is still on trimethoprim as directed by infectious disease 9/4; patient is seen in conjunction with HBO. Right dorsal foot wound substantially anteriorly is better however she continues to have a deep wound in the distal part of this that is not responding. We have been using silver collagen under border foam ooArea on the right plantar medial heel seems better. We have been using Hydrofera Blue 12/12/18 on evaluation today patient appears to be doing about the same with regard to her wound based on prior measurements. She does have some necrotic tissue noted on  the lateral aspect of the wound that is going require a little bit of sharp debridement today. This includes what appears to be potentially either severely necrotic bone or tendon. Nonetheless other than that she does not appear to have any severe  infection which is good news 9/18; it is been 2 weeks since I saw this wound. She is tolerating HBO well. Continued dramatic improvement in the area on the right dorsal foot. She still has a small wound on the heel that we have been using Hydrofera Blue. She continues with a wound VAC 9/24; patient has to be seen emergently today with a swelling on her right lateral lower leg. She says that she told Dr. Evette Doffing about this and also myself on a couple of occasions but I really have no recollection of this. She is not systemically unwell and her wound really looked good the last time I saw this. She showed this to providers at dialysis and she was able to verify that she was started on cephalexin today for 5 doses at dialysis. She dialyzes on Tuesday Thursday and Saturday. 10/2; patient is seen in conjunction with HBO. The area that is draining on the right anterior medial tibia is more extensive. Copious amounts of serosanguineous drainage with some purulence. We are still using the wound VAC on the original wound then it is stable. Culture I did of the original IandD showed MRSA I contacted dialysis she is now on vancomycin with dialysis treatments. I asked them to run a month 10/9; patient seen in conjunction with HBO. She had a new spontaneous open area just above the wound on the right medial tibia ankle. More swelling on the right medial tibia. Her wound on the foot looks about the same perhaps slightly better. There is no warmth spreading up her leg but no obvious erythema. her MRI of the foot and ankle and distal tib-fib is not booked for next Friday I discussed this with her in great detail over multiple days. it is likely she has spreading infection upper leg at least involving the distal 25% above the ankle. She knows that if I refer her to orthopedics for infectious disease they are going to recommend amputation and indeed I am not against this myself. We had a good trial at trying to  heal the foot which is what she wanted along with antibiotics debridement and HBO however she clearly has spreading infection [probably staph aureus/MRSA]. Nevertheless she once again tells me she wants to wait the left of the MRI. She still makes comments about having her amputation done in [place]. 10/19; arrives today with significant swelling on the lateral right leg. Last culture I did showed Klebsiella. Multidrug- resistant. Cipro was intermediate sensitivity and that is what I have her on pending her MRI which apparently is going to be done on Thursday this week although this seems to be moving back and forth. She is not systemically unwell. We are using silver alginate on her major wound area on the right medial foot and the draining areas on the right lateral lower leg 10/26; MRI showed extensive abscess in the anterior compartment of the right leg also widespread osteomyelitis involving osseous structures of the midfoot and portions of the hindfoot. Also suspicion for osteomyelitis anterior aspect of the distal medial malleolus. Culture I did of the purulence once again showed a multidrug-resistant Klebsiella. I have been in contact with nephrology late last week and she has been started on cefepime at dialysis to replace the  vancomycin We sent a copy of her MRI report to Dr. Geroge Baseman in [place] who is an orthopedic surgeon. The patient takes great stock in his opinion on this. She says she will go to [place] to have her leg amputated if Dr. Geroge Baseman does not feel there is any salvage options. 11/2; she still is not talk to her orthopedic surgeon in [place]. Apparently he will call her at [phone] this afternoon. The quality of this is she has not allowed me to refer her anywhere. She has been told over and over that she needs this amputated but has not agreed to be referred. She tells me her blood sugar was 600 last night but she has not been  febrile. Objective Constitutional Patient is hypertensive.. Pulse regular and within target range for patient.Marland Kitchen Respirations regular, non-labored and within target range.. Temperature is normal and within the target range for the patient.Marland Kitchen Appears in no distress. Vitals Time Taken: 2:00 PM, Height: 67 in, Weight: 125 lbs, BMI: 19.6, Temperature: 98.6 F, Pulse: 92 bpm, Respiratory Rate: 17 breaths/min, Blood Pressure: 177/84 mmHg, Capillary Blood Glucose: 600 mg/dl. General Notes: patient reported last CBG 600 last night General Notes: Wound exam; the original wound on the medial part of her foot really looks quite good except for necrotic debris in the inferior part of this. I debrided this with a #5 curette. We continue to make progress here. ooShe had necrotic material coming out of one of the IandD sites I did on the abscess in the right lateral leg I remove this with pickups and a scalpel there is still sure purulent drainage coming out of this Integumentary (Hair, Skin) Wound #43 status is Open. Original cause of wound was Gradually Appeared. The wound is located on the Right,Medial Foot. The wound measures 8.3cm length x 4.4cm width x 0.2cm depth; 28.683cm^2 area and 5.737cm^3 volume. There is muscle, tendon, and Fat Layer (Subcutaneous Tissue) Exposed exposed. There is no tunneling or undermining noted. There is a large amount of purulent drainage noted. Foul odor after cleansing was noted. The wound margin is distinct with the outline attached to the wound base. There is small (1-33%) pink granulation within the wound bed. There is a large (67-100%) amount of necrotic tissue within the wound bed including Eschar, Adherent Slough and Necrosis of Muscle. Wound #44 status is Open. Original cause of wound was Gradually Appeared. The wound is located on the Right Calcaneus. The wound measures 0.9cm length x 0.9cm width x 0cm depth; 0.636cm^2 area and 0.064cm^3 volume. There is no  tunneling or undermining noted. There is a small amount of purulent drainage noted. The wound margin is flat and intact. There is no granulation within the wound bed. There is a large (67-100%) amount of necrotic tissue within the wound bed including Eschar. Wound #46 status is Open. Original cause of wound was Other Lesion. The wound is located on the Right,Lateral Lower Leg. The wound measures 1.6cm length x 1.6cm width x 2.7cm depth; 2.011cm^2 area and 5.429cm^3 volume. There is Fat Layer (Subcutaneous Tissue) Exposed exposed. There is no tunneling noted, however, there is undermining starting at 12:00 and ending at 12:00 with a maximum distance of 4.8cm. There is a large amount of purulent drainage noted. Foul odor after cleansing was noted. The wound margin is well defined and not attached to the wound base. There is small (1-33%) granulation within the wound bed. There is a large (67-100%) amount of necrotic tissue within the wound bed including Adherent Slough. Wound #  47 status is Open. Original cause of wound was Gradually Appeared. The wound is located on the Right,Anterior Lower Leg. The wound measures 0.7cm length x 0.8cm width x 0.1cm depth; 0.44cm^2 area and 0.044cm^3 volume. There is Fat Layer (Subcutaneous Tissue) Exposed exposed. There is no tunneling or undermining noted. There is a medium amount of purulent drainage noted. Foul odor after cleansing was noted. There is large (67-100%) pink granulation within the wound bed. There is a small (1-33%) amount of necrotic tissue within the wound bed including Adherent Slough. Assessment Active Problems ICD-10 Other chronic osteomyelitis, right ankle and foot Non-pressure chronic ulcer of other part of right foot with necrosis of bone Type 1 diabetes mellitus with foot ulcer Cutaneous abscess of right lower limb Procedures Wound #43 Pre-procedure diagnosis of Wound #43 is a Diabetic Wound/Ulcer of the Lower Extremity located on  the Right,Medial Foot .Severity of Tissue Pre Debridement is: Fat layer exposed. There was a Excisional Skin/Subcutaneous Tissue Debridement with a total area of 16 sq cm performed by Ricard Dillon., MD. With the following instrument(s): Curette to remove Viable and Non-Viable tissue/material. Material removed includes Subcutaneous Tissue and Slough and. No specimens were taken. A time out was conducted at 15:18, prior to the start of the procedure. A Minimum amount of bleeding was controlled with Pressure. The procedure was tolerated well with a pain level of 0 throughout and a pain level of 0 following the procedure. Post Debridement Measurements: 8.3cm length x 4.4cm width x 0.2cm depth; 5.737cm^3 volume. Character of Wound/Ulcer Post Debridement is stable. Severity of Tissue Post Debridement is: Fat layer exposed. Post procedure Diagnosis Wound #43: Same as Pre-Procedure Wound #46 Pre-procedure diagnosis of Wound #46 is an Abscess located on the Right,Lateral Lower Leg . There was a Excisional Skin/Subcutaneous Tissue Debridement with a total area of 2.56 sq cm performed by Ricard Dillon., MD. With the following instrument(s): Blade, and Forceps to remove Viable and Non-Viable tissue/material. Material removed includes Subcutaneous Tissue. No specimens were taken. A time out was conducted at 15:18, prior to the start of the procedure. A Minimum amount of bleeding was controlled with Pressure. The procedure was tolerated well with a pain level of 0 throughout and a pain level of 0 following the procedure. Post Debridement Measurements: 1.6cm length x 1.6cm width x 2.7cm depth; 5.429cm^3 volume. Character of Wound/Ulcer Post Debridement is stable. Post procedure Diagnosis Wound #46: Same as Pre-Procedure Plan Follow-up Appointments: Return Appointment in 1 week. Dressing Change Frequency: Wound #43 Right,Medial Foot: Change dressing three times week. Wound #44 Right  Calcaneus: Change dressing three times week. Wound #46 Right,Lateral Lower Leg: Change dressing three times week. Wound #47 Right,Anterior Lower Leg: Change dressing three times week. Wound Cleansing: Clean wound with Wound Cleanser - Anasept to all wounds Primary Wound Dressing: Wound #43 Right,Medial Foot: Calcium Alginate with Silver Wound #44 Right Calcaneus: Calcium Alginate with Silver Wound #47 Right,Anterior Lower Leg: Calcium Alginate with Silver Wound #46 Right,Lateral Lower Leg: Calcium Alginate with Silver - do not pack, place over wound bed Secondary Dressing: Wound #43 Right,Medial Foot: Kerlix/Rolled [name] ABD pad Wound #47 Right,Anterior Lower Leg: Kerlix/Rolled [name] ABD pad Wound #44 Right Calcaneus: Kerlix/Rolled [name] Dry [name] Heel Cup Wound #46 Right,Lateral Lower Leg: Kerlix/Rolled [name] ABD pad Edema Control: Elevate legs to the level of the heart or above for 30 minutes daily and/or when sitting, a frequency of: - throughout the day Home Health: [facility] for wound care. - Interim  1. Silver alginate to all wound areas 2. I have encouraged her to make a decision. Whether it is to go to [place] or somewhere locally that I can refer her to but I do not think it makes any difference. It is not just a matter the abscess on the medial leg she has active osteomyelitis in the right foot and the heel. She did receive a course of vancomycin and I believe she is on cefepime now at dialysis for the multidrug-resistant Klebsiella that is pouring out of the lower parts of her leg 3. I am concerned she is going to get systemically unwell resulting in a more complex hospitalization than just the BKA. I have repetitively told her this Electronic Signature(s) Signed: 02/02/2019 5:57:33 PM By: Linton Ham MD Entered By: Linton Ham on 02/02/2019  15:40:27 -------------------------------------------------------------------------------- SuperBill Details Patient Name: Date of Service: [name] 02/02/2019 Medical Record Number:[id number] Patient Account Number: [id number] Date of Birth/Sex: Treating RN: 1970/07/07 (48 y.o. [name] Primary Care Provider: [name] Other Clinician: Referring Provider: Treating Provider/Extender:Ryo Klang, [name], [name] in Treatment: 21 Diagnosis Coding ICD-10 Codes Code Description [phone] Other chronic osteomyelitis, right ankle and foot L97.514 Non-pressure chronic ulcer of other part of right foot with necrosis of bone E10.621 Type 1 diabetes mellitus with foot ulcer L02.415 Cutaneous abscess of right lower limb Facility Procedures CPT4 Code Description: [id number] 11042 - DEB SUBQ TISSUE 20 SQ CM/< ICD-10 Diagnosis Description L97.514 Non-pressure chronic ulcer of other part of right foot with L02.415 Cutaneous abscess of right lower limb Modifier: necrosis of bon Quantity: 1 e Physician Procedures CPT4 Code Description: [id number] 11042 - WC PHYS SUBQ TISS 20 SQ CM ICD-10 Diagnosis Description L97.514 Non-pressure chronic ulcer of other part of right foot wit L02.415 Cutaneous abscess of right lower limb Modifier: h necrosis of bo Quantity: 1 ne Electronic Signature(s) Signed: 02/02/2019 5:57:33 PM By: Linton Ham MD Entered By: Linton Ham on 02/02/2019 15:40:49

## 2019-02-02 NOTE — Telephone Encounter (Signed)
[facility] asked if we had orders for pt and I told her we had not seen she since 09/2018, that it looks like Dr. Dellia Nims - Wound Care is following her at this time.

## 2019-02-04 ENCOUNTER — Other Ambulatory Visit: Payer: Self-pay

## 2019-02-04 ENCOUNTER — Encounter: Payer: Self-pay | Admitting: Endocrinology

## 2019-02-04 ENCOUNTER — Encounter: Payer: Medicare HMO | Admitting: Endocrinology

## 2019-02-04 VITALS — BP 100/60 | HR 101 | Ht 67.0 in | Wt 121.0 lb

## 2019-02-04 DIAGNOSIS — E1065 Type 1 diabetes mellitus with hyperglycemia: Secondary | ICD-10-CM

## 2019-02-04 LAB — POCT GLYCOSYLATED HEMOGLOBIN (HGB A1C): Hemoglobin A1C: 9.3 % — AB (ref 4.0–5.6)

## 2019-02-04 NOTE — Progress Notes (Signed)
This encounter was created in error - please disregard.

## 2019-02-04 NOTE — Progress Notes (Signed)
MONET, NORTH ([id number]) Visit Report for 01/13/2019 SuperBill Details Patient Name: Date of Service: [name], [name] 01/13/2019 Medical Record Number:[id number] Patient Account Number: [id number] Date of Birth/Sex: Treating RN: [birth year] (48 y.o. [name]: [name] Other Clinician: Referring Provider: Treating Provider/Extender:Melba Araki, [name], [name] in Treatment: 18 Diagnosis Coding ICD-10 Codes Code Description [phone] Other chronic osteomyelitis, right ankle and foot L97.514 Non-pressure chronic ulcer of other part of right foot with necrosis of bone E10.621 Type 1 diabetes mellitus with foot ulcer L02.415 Cutaneous abscess of right lower limb Facility Procedures CPT4 Code Description Modifier Quantity [id number] G0277-(Facility Use Only) HBOT, full body chamber, 24min 4 Physician Procedures CPT4 Code Description Modifier Quantity [id number] 32919 - WC PHYS HYPERBARIC OXYGEN THERAPY 1 ICD-10 Diagnosis Description E10.621 Type 1 diabetes mellitus with foot ulcer Electronic Signature(s) Signed: 01/13/2019 5:42:33 PM By: Linton Ham MD Signed: 02/04/2019 4:01:42 PM By: [name] EMT/HBOT Entered By: [name] on 01/13/2019 15:45:15

## 2019-02-04 NOTE — Progress Notes (Signed)
[name], [name] ([id number]) Visit Report for 01/13/2019 Arrival Information Details Patient Name: Date of Service: [name], [name] 01/13/2019 1:00 PM Medical Record Number:[id number] Patient Account Number: [id number] Date of Birth/Sex: Treating RN: [birth year] (48 y.o. [name] Primary Care Dandra Shambaugh: [name] Other Clinician: Referring Noble Cicalese: Treating Alisha Burgo/Extender:[name], [name], [name] in Treatment: 18 Visit Information History Since Last Visit Added or deleted any medications: No Patient Arrived: Wheel Chair Any new allergies or adverse reactions: No Arrival Time: 12:55 Had a fall or experienced change in No activities of daily living that may affect Accompanied By: self risk of falls: Transfer Assistance: None Signs or symptoms of abuse/neglect since last No Patient Identification Verified: Yes visito Secondary Verification Process Completed: Yes Hospitalized since last visit: No Patient Requires Transmission-Based No Implantable device outside of the clinic excluding No Precautions: cellular tissue based products placed in the center Patient Has Alerts: No since last visit: Pain Present Now: No Electronic Signature(s) Signed: 02/04/2019 4:01:42 PM By: [name] EMT/HBOT Entered By: [name] on 01/13/2019 13:26:13 -------------------------------------------------------------------------------- Encounter Discharge Information Details Patient Name: Date of Service: [name] 01/13/2019 1:00 PM Medical Record Number:[id number] Patient Account Number: [id number] Date of Birth/Sex: Treating RN: 09/13/1970 (48 y.o. [name] Primary Care Latisa Belay: [name] Other Clinician: Referring Edelmiro Innocent: Treating Heman Que/Extender:[name], [name], [name] in Treatment: 18 Encounter Discharge Information Items Discharge Condition: Stable Ambulatory Status: Wheelchair Discharge Destination:  Home Transportation: Private Auto Accompanied By: self Schedule Follow-up Appointment: Yes Clinical Summary of Care: Patient Declined Electronic Signature(s) Signed: 02/04/2019 4:01:42 PM By: [name] EMT/HBOT Entered By: [name] on 01/13/2019 15:45:52 -------------------------------------------------------------------------------- Patient/Caregiver Education Details [name] 10/13/2020andnbsp1:00 Patient Name: Date of Service: L. PM Medical Record Patient Account Number: [id number] Number: Treating RN: Carlene Coria Date of Birth/Gender: [birth year] (48 y.o. F) Other Clinician: Primary Care Physician:MOREIRA, [name] Treating Linton Ham Referring Physician: Physician/Extender: Juanito Doom in Treatment: 63 Education Assessment Education Provided To: Patient Education Topics Provided Hyperbaric Oxygenation: Methods: Explain/Verbal Responses: State content correctly Electronic Signature(s) Signed: 02/04/2019 4:01:42 PM By: [name] EMT/HBOT Entered By: [name] on 01/13/2019 15:45:35 -------------------------------------------------------------------------------- Vitals Details Patient Name: Date of Service: [name] 01/13/2019 1:00 PM Medical Record Number:[id number] Patient Account Number: [id number] Date of Birth/Sex: Treating RN: 1970/10/22 (48 y.o. [name] Primary Care Shantale Holtmeyer: [name] Other Clinician: Referring Merida Alcantar: Treating Saad Buhl/Extender:[name], [name], [name] in Treatment: 18 Vital Signs Time Taken: 13:00 Temperature (F): 97.9 Height (in): 67 Pulse (bpm): 84 Weight (lbs): 125 Respiratory Rate (breaths/min): 16 Body Mass Index (BMI): 19.6 Blood Pressure (mmHg): 143/75 Capillary Blood Glucose (mg/dl): 130 Reference Range: 80 - 120 mg / dl Electronic Signature(s) Signed: 02/04/2019 4:01:42 PM By: [name] EMT/HBOT Entered By: [name] on  01/13/2019 13:26:31

## 2019-02-05 NOTE — Progress Notes (Signed)
ATHA, MURADYAN ([id number]) Visit Report for 02/02/2019 Arrival Information Details Patient Name: Date of Service: CATELYN, FRIEL 02/02/2019 1:30 PM Medical Record Number:[id number] Patient Account Number: [id number] Date of Birth/Sex: Treating RN: 10-Feb-1971 (48 y.o. F) Verita Schneiders, Shannon Primary Care Shallyn Constancio: [name] Other Clinician: Referring Shai Mckenzie: Treating Rori Goar/Extender:[name], [name], [name] in Treatment: 21 Visit Information History Since Last Visit Added or deleted any medications: No Patient Arrived: Wheel Chair Any new allergies or adverse reactions: No Arrival Time: 14:00 Had a fall or experienced change in No activities of daily living that may affect Accompanied By: self risk of falls: Transfer Assistance: None Signs or symptoms of abuse/neglect since last No Patient Identification Verified: Yes visito Secondary Verification Process Completed: Yes Hospitalized since last visit: No Patient Requires Transmission-Based No Implantable device outside of the clinic excluding No Precautions: cellular tissue based products placed in the center Patient Has Alerts: No since last visit: Has Dressing in Place as Prescribed: Yes Pain Present Now: No Electronic Signature(s) Signed: 02/05/2019 5:16:49 PM By: [name] Entered By: [name] on 02/02/2019 14:02:07 -------------------------------------------------------------------------------- Encounter Discharge Information Details Patient Name: Date of Service: [name] 02/02/2019 1:30 PM Medical Record Number:[id number] Patient Account Number: [id number] Date of Birth/Sex: Treating RN: [birth year] (48 y.o. [name] Primary Care Annaliz Aven: [name] Other Clinician: Referring Muath Hallam: Treating Jerol Rufener/Extender:[name], [name], [name] in Treatment: 21 Encounter Discharge Information Items Post Procedure Vitals Discharge  Condition: Stable Temperature (F): 98.6 Ambulatory Status: Wheelchair Pulse (bpm): 92 Discharge Destination: Home Respiratory Rate (breaths/min): 17 Transportation: Other Blood Pressure (mmHg): 177/84 Accompanied By: self Schedule Follow-up Appointment: Yes Clinical Summary of Care: Patient Declined Electronic Signature(s) Signed: 02/05/2019 5:16:49 PM By: [name] Entered By: [name] on 02/02/2019 15:33:11 -------------------------------------------------------------------------------- Lower Extremity Assessment Details Patient Name: Date of Service: LEHUA, FLORES 02/02/2019 1:30 PM Medical Record Number:[id number] Patient Account Number: [id number] Date of Birth/Sex: Treating RN: June 13, 1970 (48 y.o. [name] Primary Care Lexxus Underhill: [name] Other Clinician: Referring Lisa Blakeman: Treating [place] Droll/Extender:[name], [name], [name] in Treatment: 21 Edema Assessment Assessed: [Left: No] [Right: No] Edema: [Left: Ye] [Right: s] Calf Left: Right: Point of Measurement: cm From Medial Instep cm 29 cm Ankle Left: Right: Point of Measurement: cm From Medial Instep cm 22.5 cm Vascular Assessment Pulses: Dorsalis Pedis Palpable: [Right:Yes] Electronic Signature(s) Signed: 02/05/2019 5:16:49 PM By: [name] Entered By: [name] on 02/02/2019 14:23:51 -------------------------------------------------------------------------------- Multi Wound Chart Details Patient Name: Date of Service: [name] 02/02/2019 1:30 PM Medical Record Number:[id number] Patient Account Number: [id number] Date of Birth/Sex: Treating RN: 05/29/1970 (48 y.o. Nancy Fetter Primary Care Melvenia Favela: [name] Other Clinician: Referring Chelcea Zahn: Treating Kelicia Youtz/Extender:[name], [name], [name] in Treatment: 21 Vital Signs Height(in): 35 Capillary Blood 600 Glucose(mg/dl): Weight(lbs): 125 Pulse(bpm):  4 Body Mass Index(BMI): 20 Blood Pressure(mmHg): 177/84 Temperature(F): 98.6 Respiratory 17 Rate(breaths/min): Photos: [43:No Photos] [44:No Photos] [46:No Photos] Wound Location: [43:Right Foot - Medial] [44:Right Calcaneus] [46:Right Lower Leg - Lateral] Wounding Event: [43:Gradually Appeared] [44:Gradually Appeared] [46:Other Lesion] 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] [44:Lower Extremity] Comorbid History: [43:Cataracts, Chronic sinus Cataracts, Chronic sinus Cataracts, Chronic sinus problems/congestion, Anemia, Sleep Apnea, Deep Anemia, Sleep Apnea, Deep Anemia, Sleep Apnea, Deep Vein Thrombosis, Hypertension, Peripheral Hypertension,  Peripheral Hypertension, Peripheral Arterial Disease, Type I Diabetes, Osteoarthritis, Osteomyelitis, Neuropathy, Osteomyelitis, Neuropathy, Osteomyelitis, Neuropathy, Seizure Disorder] [44:problems/congestion, Vein Thrombosis, Arterial Disease, Type I  Diabetes, Osteoarthritis, Seizure Disorder] [46:problems/congestion, Vein Thrombosis, Arterial Disease, Type I Diabetes,  Osteoarthritis, Seizure Disorder] Date Acquired: [43:08/04/2018] [44:08/28/2018] [46:12/25/2018] Weeks of Treatment: [43:21] [44:21] [46:5] Wound Status: [43:Open] [44:Open] [46:Open] Clustered Wound: [43:Yes] [44:No] [46:Yes] Clustered Quantity: [43:2] [44:N/A] [46:2] Measurements L x W x D 8.3x4.4x0.2 [44:0.9x0.9x0] [46:1.6x1.6x2.7] (cm) Area (cm) : [43:28.683] [44:0.636] [46:2.011] Volume (cm) : [56:4.332] [44:0.064] [46:5.429] % Reduction in Area: [43:60.50%] [44:74.70%] [46:-2445.60%] % Reduction in Volume: 73.60% [44:74.50%] [46:-6772.20%] Starting Position 1 [46:12] (o'clock): Ending Position 1 [46:12] (o'clock): Maximum Distance 1 [46:4.8] (cm): Undermining: [43:No] [44:No] [46:Yes] Classification: [43:Grade 4] [44:Grade 2] [46:Full Thickness Without Exposed Support Structures] Exudate Amount: [43:Large]  [44:Small] [46:Large] Exudate Type: [43:Purulent] [44:Purulent] [46:Purulent] Exudate Color: [43:yellow, brown, green] [44:yellow, brown, green] [46:yellow, brown, green] Foul Odor After Cleansing:Yes [44:No] [46:Yes] Odor Anticipated Due to No [44:N/A] [46:No] Product Use: Wound Margin: [43:Distinct, outline attached Flat and Intact] [46:Well defined, not attached] Granulation Amount: [43:Small (1-33%)] [44:None Present (0%)] [46:Small (1-33%)] Granulation Quality: [43:Pink] [44:N/A] [46:N/A] Necrotic Amount: [43:Large (67-100%)] [44:Large (67-100%)] [46:Large (67-100%)] Necrotic Tissue: [43:Eschar, Adherent Slough Eschar] [46:Adherent Slough] Exposed Structures: [43:Fat Layer (Subcutaneous Fascia: No Tissue) Exposed: Yes Tendon: Yes Muscle: Yes Fascia: No Joint: No Bone: No] [44:Fat Layer (Subcutaneous Tissue) Exposed: No Tendon: No Muscle: No Joint: No Bone: No] [46:Fat Layer (Subcutaneous Tissue) Exposed: Yes  Fascia: No Tendon: No Muscle: No Joint: No Bone: No] Epithelialization: [43:None] [44:None] [46:None] Debridement: [43:Debridement - Excisional] [44:N/A] [46:Debridement - Excisional] Pre-procedure [43:15:18] [44:N/A] [46:15:18] Verification/Time Out Taken: Tissue Debrided: [43:Subcutaneous, Slough] [44:N/A] [46:Subcutaneous] Level: [43:Skin/Subcutaneous Tissue] [44:N/A] [46:Skin/Subcutaneous Tissue] Debridement Area (sq cm):16 [44:N/A] [46:2.56] Instrument: [43:Curette] [44:N/A] [46:Blade, Forceps] Bleeding: [43:Minimum] [44:N/A] [46:Minimum] Hemostasis Achieved: [43:Pressure] [44:N/A] [46:Pressure] Procedural Pain: [43:0] [44:N/A] [46:0] Post Procedural Pain: [43:0] [44:N/A] [46:0] Debridement Treatment Procedure was tolerated [44:N/A] [46:Procedure was tolerated] Response: [43:well] [46:well] Post Debridement [43:8.3x4.4x0.2] [44:N/A] [46:1.6x1.6x2.7] Measurements L x W x D (cm) Post Debridement [43:5.737] [44:N/A] [46:5.429] Volume: (cm) Procedures Performed:  Debridement [43:47] [44:N/A N/A] [46:Debridement N/A] Photos: [43:No Photos] [44:N/A] [46:N/A] Wound Location: [43:Right Lower Leg - Anterior N/A] [46:N/A] Wounding Event: [43:Gradually Appeared] [44:N/A] [46:N/A] Primary Etiology: [43:Abscess] [44:N/A] [46:N/A] Comorbid History: [43:Cataracts, Chronic sinus N/A problems/congestion, Anemia, Sleep Apnea, Deep Vein Thrombosis, Hypertension, Peripheral Arterial Disease, Type I Diabetes, Osteoarthritis, Osteomyelitis, Neuropathy, Seizure Disorder] [46:N/A] Date Acquired: [43:01/31/2019] [44:N/A] [46:N/A] Weeks of Treatment: [43:0] [44:N/A] [46:N/A] Wound Status: [43:Open] [44:N/A] [46:N/A] Clustered Wound: [43:No] [44:N/A] [46:N/A] Clustered Quantity: [43:N/A] [44:N/A] [46:N/A] Measurements L x W x D 0.7x0.8x0.1 [44:N/A] [46:N/A] (cm) Area (cm) : [43:0.44] [44:N/A] [46:N/A] Volume (cm) : [43:0.044] [44:N/A] [46:N/A] % Reduction in Area: [43:N/A] [44:N/A] [46:N/A] % Reduction in Volume: N/A [44:N/A] [46:N/A] Undermining: [43:No] [44:N/A] [46:N/A] Classification: [43:Full Thickness Without Exposed Support Structures] [44:N/A] [46:N/A] Exudate Amount: [43:Medium] [44:N/A] [46:N/A] Exudate Type: [43:Purulent] [44:N/A] [46:N/A] Exudate Color: [43:yellow, brown, green] [44:N/A] [46:N/A] Foul Odor After Cleansing:Yes [44:N/A] [46:N/A] Odor Anticipated Due to No [44:N/A] [46:N/A] Product Use: Wound Margin: [43:N/A] [44:N/A N/A] Granulation Amount: [43:Large (67-100%)] [44:N/A N/A] Granulation Quality: [43:Pink] [44:N/A N/A] Necrotic Amount: [43:Small (1-33%)] [44:N/A N/A] Necrotic Tissue: [43:Adherent Slough] [44:N/A N/A] Exposed Structures: [43:Fat Layer (Subcutaneous Tissue) Exposed: Yes Fascia: No Tendon: No Muscle: No Joint: No Bone: No] [44:N/A N/A] Epithelialization: [43:None] [44:N/A N/A] Debridement: [43:N/A] [44:N/A N/A] Tissue Debrided: [43:N/A] [44:N/A N/A] Level: [43:N/A] [44:N/A N/A] Debridement Area (sq cm):N/A [44:N/A  N/A] Instrument: [43:N/A] [44:N/A N/A] Bleeding: [43:N/A] [44:N/A N/A] Hemostasis Achieved: [43:N/A] [44:N/A N/A] Procedural Pain: [43:N/A] [44:N/A N/A] Post Procedural Pain: [43:N/A] [44:N/A N/A] Debridement Treatment N/A [44:N/A N/A] Response: Post Debridement [43:N/A] [44:N/A N/A] Measurements L x W x D (cm) Post  Debridement [43:N/A] [44:N/A N/A] Volume: (cm) Procedures Performed: N/A [44:N/A N/A] Treatment Notes Wound #43 (Right, Medial Foot) 1. Cleanse With Wound Cleanser 3. Primary Dressing Applied Calcium Alginate Ag 4. Secondary Dressing ABD Pad Dry Gauze Roll Gauze 5. Secured With Tape Wound #44 (Right Calcaneus) 1. Cleanse With Wound Cleanser 3. Primary Dressing Applied Calcium Alginate Ag 4. Secondary Dressing ABD Pad Dry Gauze Roll Gauze 5. Secured With Tape Wound #46 (Right, Lateral Lower Leg) 1. Cleanse With Wound Cleanser 3. Primary Dressing Applied Calcium Alginate Ag 4. Secondary Dressing ABD Pad Dry Gauze Roll Gauze 5. Secured With Tape Wound #47 (Right, Anterior Lower Leg) 1. Cleanse With Wound Cleanser 3. Primary Dressing Applied Calcium Alginate Ag 4. Secondary Dressing ABD Pad Dry Gauze Roll Gauze 5. Secured With Recruitment consultant) Signed: 02/02/2019 5:57:33 PM By: Linton Ham MD Signed: 02/02/2019 6:00:40 PM By: [name] RN, BSN Entered By: Linton Ham on 02/02/2019 15:36:33 -------------------------------------------------------------------------------- Multi-Disciplinary Care Plan Details Patient Name: Date of Service: [name] 02/02/2019 1:30 PM Medical Record Number:[id number] Patient Account Number: [id number] Date of Birth/Sex: Treating RN: [birth year] (48 y.o. Nancy Fetter Primary Care Enisa Runyan: [name] Other Clinician: Referring Ella Golomb: Treating Carlton Buskey/Extender:[name], [name], [name] in Treatment: 21 Active Inactive Wound/Skin Impairment Nursing  Diagnoses: Impaired tissue integrity Knowledge deficit related to ulceration/compromised skin integrity Goals: Patient/caregiver will verbalize understanding of skin care regimen Date Initiated: 09/03/2018 Target Resolution Date: 02/27/2019 Goal Status: Active Ulcer/skin breakdown will have a volume reduction of 30% by week 4 Date Initiated: 09/03/2018 Date Inactivated: 10/07/2018 Target Resolution Date: 10/01/2018 Unmet Goal Status: Unmet Reason: COMORBITIES Ulcer/skin breakdown will have a volume reduction of 50% by week 8 Date Initiated: 10/07/2018 Date Inactivated: 11/03/2018 Target Resolution Date: 10/31/2018 Unmet Goal Status: Unmet Reason: Osteomyelitis Interventions: Assess patient/caregiver ability to obtain necessary supplies Assess patient/caregiver ability to perform ulcer/skin care regimen upon admission and as needed Assess ulceration(s) every visit Provide education on ulcer and skin care Treatment Activities: Skin care regimen initiated : 09/03/2018 Topical wound management initiated : 09/03/2018 Notes: Electronic Signature(s) Signed: 02/02/2019 6:00:40 PM By: [name] RN, BSN Entered By: [name] on 02/02/2019 15:19:45 -------------------------------------------------------------------------------- Pain Assessment Details Patient Name: Date of Service: [name] 02/02/2019 1:30 PM Medical Record Number:[id number] Patient Account Number: [id number] Date of Birth/Sex: Treating RN: 1970/08/07 (48 y.o. [name] Primary Care Nakeitha Milligan: [name] Other Clinician: Referring Caden Fukushima: Treating Finlay Godbee/Extender:[name], [name], [name] in Treatment: 21 Active Problems Location of Pain Severity and Description of Pain Patient Has Paino No Site Locations Pain Management and Medication Current Pain Management: Electronic Signature(s) Signed: 02/05/2019 5:16:49 PM By: [name] Entered By: [name] on 02/02/2019  14:22:51 -------------------------------------------------------------------------------- Patient/Caregiver Education Details Patient Name: Date of Service: [name] 11/2/2020andnbsp1:30 PM Medical Record Number:[id number] Patient Account Number: [id number] Date of Birth/Gender: Treating RN: Jul 17, 1970 (48 y.o. Nancy Fetter Primary Care Physician: [name] Other Clinician: Referring Physician: Treating Physician/Extender:[name], [name], [name] in Treatment: 21 Education Assessment Education Provided To: Patient Education Topics Provided Wound/Skin Impairment: Methods: Explain/Verbal Responses: State content correctly Electronic Signature(s) Signed: 02/02/2019 6:00:40 PM By: [name] RN, BSN Entered By: [name] on 02/02/2019 15:20:00 -------------------------------------------------------------------------------- Wound Assessment Details Patient Name: Date of Service: [name] 02/02/2019 1:30 PM Medical Record Number:[id number] Patient Account Number: [id number] Date of Birth/Sex: Treating RN: 1970/07/03 (48 y.o. [name] Primary Care Larosa Rhines: [name] Other Clinician: Referring Hai Grabe: Treating Orestes Geiman/Extender:[name], [name], [name] in Treatment: 21 Wound Status Wound Number:  31 Primary Diabetic Wound/Ulcer of the Lower Extremity Etiology: Wound Location: Right Foot - Medial Wound Open Wound Open Wounding Event: Gradually Appeared Status: Date Acquired: 08/04/2018 Comorbid Cataracts, Chronic sinus problems/congestion, Weeks Of Treatment: 21 History: Anemia, Sleep Apnea, Deep Vein Thrombosis, Clustered Wound: Yes Hypertension, Peripheral Arterial Disease, Type I Diabetes, Osteoarthritis, Osteomyelitis, Neuropathy, Seizure Disorder Photos Wound Measurements Length: (cm) 8.3 % Reduction i Width: (cm) 4.4 % Reduction i Depth: (cm) 0.2 Epithelializa Clustered Quantity:  2 Tunneling: Area: (cm) 28.683 Undermining: Volume: (cm) 5.737 Wound Description Classification: Grade 4 Foul Odor Aft Wound Margin: Distinct, outline attached Due to Produc Exudate Amount: Large Slough/Fibrin Exudate Type: Purulent Exudate Color: yellow, brown, green Wound Bed Granulation Amount: Small (1-33%) Granulation Quality: Pink Fascia Expose Necrotic Amount: Large (67-100%) Fat Layer (Su Necrotic Quality: Eschar, Adherent Slough Tendon Expose Muscle Expose Necrosis Joint Exposed Bone Exposed: er Cleansing: Yes t Use: No o Yes Exposed Structure d: No bcutaneous Tissue) Exposed: Yes d: Yes d: Yes of Muscle: Yes : No No n Area: 60.5% n Volume: 73.6% tion: None No No Treatment Notes Wound #43 (Right, Medial Foot) 1. Cleanse With Wound Cleanser 3. Primary Dressing Applied Calcium Alginate Ag 4. Secondary Dressing ABD Pad Dry Gauze Roll Gauze 5. Secured With Recruitment consultant) Signed: 02/03/2019 10:47:12 AM By: [name] EMT/HBOT Signed: 02/05/2019 5:16:49 PM By: [name] Entered By: [name] on 02/03/2019 09:37:08 -------------------------------------------------------------------------------- Wound Assessment Details Patient Name: Date of Service: [name] 02/02/2019 1:30 PM Medical Record Number:[id number] Patient Account Number: [id number] Date of Birth/Sex: Treating RN: 02-21-1971 (48 y.o. [name] Primary Care Lakeisa Heninger: [name] Other Clinician: Referring Brayam Boeke: Treating Augusten Lipkin/Extender:[name], [name], [name] in Treatment: 21 Wound Status Wound Number: 44 Primary Diabetic Wound/Ulcer of the Lower Extremity Etiology: Wound Location: Right Calcaneus Wound Open Wounding Event: Gradually Appeared Status: Date Acquired: 08/28/2018 Comorbid Cataracts, Chronic sinus problems/congestion, Weeks Of Treatment: 21 History: Anemia, Sleep Apnea, Deep Vein  Thrombosis, Clustered Wound: No Hypertension, Peripheral Arterial Disease, Type I Diabetes, Osteoarthritis, Osteomyelitis, Neuropathy, Seizure Disorder Photos Wound Measurements Length: (cm) 0.9 Width: (cm) 0.9 Depth: (cm) 0 Area: (cm) 0.636 Volume: (cm) 0.064 Wound Description Classification: Grade 2 Wound Margin: Flat and Intact Exudate Amount: Small Exudate Type: Purulent Exudate Color: yellow, brown, green Wound Bed Granulation Amount: None Present (0%) Necrotic Amount: Large (67-100%) Necrotic Quality: Eschar ter Cleansing: No no No Exposed Structure ed: No ubcutaneous Tissue) Exposed: No osed: No osed: No sed: No ed: No % Reduction in Area: 74.7% % Reduction in Volume: 74.5% Epithelialization: None Tunneling: No Undermining: No Foul Odor Af Slough/Fibri Fascia Expos Fat Layer (S Tendon Exp Muscle Exp Joint Expo Bone Expos Treatment Notes Wound #44 (Right Calcaneus) 1. Cleanse With Wound Cleanser 3. Primary Dressing Applied Calcium Alginate Ag 4. Secondary Dressing ABD Pad Dry Gauze Roll Gauze 5. Secured With Recruitment consultant) Signed: 02/03/2019 10:47:12 AM By: [name] EMT/HBOT Signed: 02/05/2019 5:16:49 PM By: [name] Entered By: [name] on 02/03/2019 09:32:23 -------------------------------------------------------------------------------- Wound Assessment Details Patient Name: Date of Service: [name] 02/02/2019 1:30 PM Medical Record Number:[id number] Patient Account Number: [id number] Date of Birth/Sex: Treating RN: 06-30-1970 (48 y.o. [name] Primary Care Wendell Nicoson: [name] Other Clinician: Referring Ysabella Babiarz: Treating Anastasia Tompson/Extender:[name], [name], [name] in Treatment: 21 Wound Status Wound Number: 46 Primary Abscess Etiology: Wound Location: Right Lower Leg - Lateral Wound Open Wounding Event: Other Lesion Status: Date Acquired: 12/25/2018 Notes:  MD IandD today. Weeks Of Treatment: 5 Comorbid Cataracts,  Chronic sinus problems/congestion, Clustered Wound: Yes History: Anemia, Sleep Apnea, Deep Vein Thrombosis, Hypertension, Peripheral Arterial Disease, Type I Diabetes, Osteoarthritis, Osteomyelitis, Neuropathy, Seizure Disorder Photos Wound Measurements Length: (cm) 1.6 Width: (cm) 1.6 Depth: (cm) 2.7 Clustered Quantity: 2 Area: (cm) 2.011 Volume: (cm) 5.429 % Reduction in Area: -2445.6% % Reduction in Volume: -6772.2% Epithelialization: None Tunneling: No Undermining: Yes Starting Position (o'clock): 12 Ending Position (o'clock): 12 Maximum Distance: (cm) 4.8 Wound Description Classification: Full Thickness Without Exposed Support Foul Odo Structures Due to P Wound Slough/F Well defined, not attached Margin: Exudate Large Amount: Exudate Purulent Type: Exudate yellow, brown, green Color: Wound Bed Granulation Amount: Small (1-33%) Necrotic Amount: Large (67-100%) Fascia E Necrotic Quality: Adherent Slough Fat Laye Tendon E Muscle E Joint Ex Bone Exp r After Cleansing: Yes roduct Use: No ibrino Yes Exposed Structure xposed: No r (Subcutaneous Tissue) Exposed: Yes xposed: No xposed: No posed: No osed: No Treatment Notes Wound #46 (Right, Lateral Lower Leg) 1. Cleanse With Wound Cleanser 3. Primary Dressing Applied Calcium Alginate Ag 4. Secondary Dressing ABD Pad Dry Gauze Roll Gauze 5. Secured With Recruitment consultant) Signed: 02/03/2019 10:47:12 AM By: [name] EMT/HBOT Signed: 02/05/2019 5:16:49 PM By: [name] Entered By: [name] on 02/03/2019 09:38:01 -------------------------------------------------------------------------------- Wound Assessment Details Patient Name: Date of Service: [name] 02/02/2019 1:30 PM Medical Record Number:[id number] Patient Account Number: [id number] Date of Birth/Sex: Treating RN: Nov 21, 1970 (48 y.o. [name] Primary Care Alleyne Lac: [name] Other Clinician: Referring Rhys Lichty: Treating Marianne Golightly/Extender:[name], [name], [name] in Treatment: 21 Wound Status Wound Number: 47 Primary Abscess Etiology: Wound Location: Right Lower Leg - Anterior Wound Open Wounding Event: Gradually Appeared Status: Date Acquired: 01/31/2019 Comorbid Cataracts, Chronic sinus problems/congestion, Weeks Of Treatment: 0 History: Anemia, Sleep Apnea, Deep Vein Thrombosis, Clustered Wound: No Hypertension, Peripheral Arterial Disease, Type I Diabetes, Osteoarthritis, Osteomyelitis, Neuropathy, Seizure Disorder Photos Wound Measurements Length: (cm) 0.7 % Reduct Width: (cm) 0.8 % Reduct Depth: (cm) 0.1 Epitheli Area: (cm) 0.44 Tunneli Volume: (cm) 0.044 Undermi Wound Description Classification: Full Thickness Without Exposed Support Foul Odo Structures Due to P Exudate Slough/F Medium Amount: Exudate Purulent Type: Exudate yellow, brown, green Color: Wound Bed Granulation Amount: Large (67-100%) Granulation Quality: Pink Fascia E Necrotic Amount: Small (1-33%) Fat Laye Necrotic Quality: Adherent Slough Tendon E Muscle E Joint Ex Bone Exp r After Cleansing: Yes roduct Use: No ibrino Yes Exposed Structure xposed: No r (Subcutaneous Tissue) Exposed: Yes xposed: No xposed: No posed: No osed: No ion in Area: 0% ion in Volume: 0% alization: None ng: No ning: No Treatment Notes Wound #47 (Right, Anterior Lower Leg) 1. Cleanse With Wound Cleanser 3. Primary Dressing Applied Calcium Alginate Ag 4. Secondary Dressing ABD Pad Dry Gauze Roll Gauze 5. Secured With Recruitment consultant) Signed: 02/03/2019 10:47:12 AM By: [name] EMT/HBOT Signed: 02/05/2019 5:16:49 PM By: [name] Entered By: [name] on 02/03/2019 09:32:49 -------------------------------------------------------------------------------- Vitals  Details Patient Name: Date of Service: [name] 02/02/2019 1:30 PM Medical Record Number:[id number] Patient Account Number: [id number] Date of Birth/Sex: Treating RN: 1970/08/30 (48 y.o. [name] Primary Care Pheobe Sandiford: [name] Other Clinician: Referring Inis Borneman: Treating Teriyah Purington/Extender:[name], [name], [name] in Treatment: 21 Vital Signs Time Taken: 14:00 Temperature (F): 98.6 Height (in): 67 Pulse (bpm): 92 Weight (lbs): 125 Respiratory Rate (breaths/min): 17 Body Mass Index (BMI): 19.6 Blood Pressure (mmHg): 177/84 Capillary Blood Glucose (mg/dl): 600 Reference Range: 80 - 120 mg / dl Notes patient reported last CBG 600  last night Electronic Signature(s) Signed: 02/05/2019 5:16:49 PM By: [name] Entered By: [name] on 02/02/2019 14:03:12

## 2019-02-09 ENCOUNTER — Other Ambulatory Visit: Payer: Self-pay

## 2019-02-09 ENCOUNTER — Encounter (HOSPITAL_BASED_OUTPATIENT_CLINIC_OR_DEPARTMENT_OTHER): Payer: Medicare HMO | Admitting: Internal Medicine

## 2019-02-09 DIAGNOSIS — E10621 Type 1 diabetes mellitus with foot ulcer: Secondary | ICD-10-CM | POA: Diagnosis not present

## 2019-02-09 NOTE — Progress Notes (Addendum)
PAISLEI, DORVAL ([id number]) Visit Report for 02/09/2019 Arrival Information Details Patient Name: Date of Service: ELYA, TARQUINIO 02/09/2019 2:00 PM Medical Record Number:[id number] Patient Account Number: [id number] Date of Birth/Sex: Treating RN: September 17, 1970 (48 y.o. [name], Meta.Reding Primary Care Quynn Vilchis: [name] Other Clinician: Referring Kerrie Timm: Treating Deniese Oberry/Extender:[name], [name], [name] in Treatment: 22 Visit Information History Since Last Visit Added or deleted any medications: No Patient Arrived: Wheel Chair Any new allergies or adverse reactions: No Arrival Time: 13:50 Had a fall or experienced change in No activities of daily living that may affect Accompanied By: self risk of falls: Transfer Assistance: None Signs or symptoms of abuse/neglect since last No Patient Identification Verified: Yes visito Secondary Verification Process Completed: Yes Hospitalized since last visit: No Patient Requires Transmission-Based No Implantable device outside of the clinic excluding No Precautions: cellular tissue based products placed in the center Patient Has Alerts: No since last visit: Has Dressing in Place as Prescribed: Yes Pain Present Now: No Electronic Signature(s) Signed: 02/09/2019 3:33:55 PM By: Deon Pilling Entered By: Deon Pilling on 02/09/2019 13:50:58 -------------------------------------------------------------------------------- Clinic Level of Care Assessment Details Patient Name: Date of Service: CARIA, TRANSUE 02/09/2019 2:00 PM Medical Record Number:[id number] Patient Account Number: [id number] Date of Birth/Sex: Treating RN: 22-Jun-1970 (48 y.o. Nancy Fetter Primary Care Noah Lembke: [name] Other Clinician: Referring Gerrit Rafalski: Treating Yunuen Mordan/Extender:[name], [name], [name] in Treatment: 22 Clinic Level of Care Assessment Items TOOL 4 Quantity Score X - Use when only an EandM is  performed on FOLLOW-UP visit 1 0 ASSESSMENTS - Nursing Assessment / Reassessment X - Reassessment of Co-morbidities (includes updates in patient status) 1 10 X - Reassessment of Adherence to Treatment Plan 1 5 ASSESSMENTS - Wound and Skin Assessment / Reassessment []  - Simple Wound Assessment / Reassessment - one wound 0 X - Complex Wound Assessment / Reassessment - multiple wounds 4 5 []  - Dermatologic / Skin Assessment (not related to wound area) 0 ASSESSMENTS - Focused Assessment []  - Circumferential Edema Measurements - multi extremities 0 []  - Nutritional Assessment / Counseling / Intervention 0 X - Lower Extremity Assessment (monofilament, tuning fork, pulses) 1 5 []  - Peripheral Arterial Disease Assessment (using hand held doppler) 0 ASSESSMENTS - Ostomy and/or Continence Assessment and Care []  - Incontinence Assessment and Management 0 []  - Ostomy Care Assessment and Management (repouching, etc.) 0 PROCESS - Coordination of Care X - Simple Patient / Family Education for ongoing care 1 15 []  - Complex (extensive) Patient / Family Education for ongoing care 0 X - Staff obtains Programmer, systems, Records, Test Results / Process Orders 1 10 X - Staff telephones HHA, Nursing Homes / Clarify orders / etc 1 10 []  - Routine Transfer to another Facility (non-emergent condition) 0 []  - Routine Hospital Admission (non-emergent condition) 0 []  - New Admissions / Biomedical engineer / Ordering NPWT, Apligraf, etc. 0 []  - Emergency Hospital Admission (emergent condition) 0 X - Simple Discharge Coordination 1 10 []  - Complex (extensive) Discharge Coordination 0 PROCESS - Special Needs []  - Pediatric / Minor Patient Management 0 []  - Isolation Patient Management 0 []  - Hearing / Language / Visual special needs 0 []  - Assessment of Community assistance (transportation, D/C planning, etc.) 0 []  - Additional assistance / Altered mentation 0 []  - Support Surface(s) Assessment (bed, cushion, seat,  etc.) 0 INTERVENTIONS - Wound Cleansing / Measurement []  - Simple Wound Cleansing - one wound 0 X - Complex Wound Cleansing - multiple wounds 4 5 X - Wound  Imaging (photographs - any number of wounds) 1 5 []  - Wound Tracing (instead of photographs) 0 []  - Simple Wound Measurement - one wound 0 X - Complex Wound Measurement - multiple wounds 4 5 INTERVENTIONS - Wound Dressings []  - Small Wound Dressing one or multiple wounds 0 []  - Medium Wound Dressing one or multiple wounds 0 X - Large Wound Dressing one or multiple wounds 1 20 X - Application of Medications - topical 1 5 []  - Application of Medications - injection 0 INTERVENTIONS - Miscellaneous []  - External ear exam 0 []  - Specimen Collection (cultures, biopsies, blood, body fluids, etc.) 0 []  - Specimen(s) / Culture(s) sent or taken to Lab for analysis 0 []  - Patient Transfer (multiple staff / Civil Service fast streamer / Similar devices) 0 []  - Simple Staple / Suture removal (25 or less) 0 []  - Complex Staple / Suture removal (26 or more) 0 []  - Hypo / Hyperglycemic Management (close monitor of Blood Glucose) 0 []  - Ankle / Brachial Index (ABI) - do not check if billed separately 0 X - Vital Signs 1 5 Has the patient been seen at the hospital within the last three years: Yes Total Score: 160 Level Of Care: New/Established - Level 5 Electronic Signature(s) Signed: 02/09/2019 5:46:53 PM By: [name] RN, BSN Entered By: [name] on 02/09/2019 17:40:38 -------------------------------------------------------------------------------- Encounter Discharge Information Details Patient Name: Date of Service: [name] 02/09/2019 2:00 PM Medical Record Number:[id number] Patient Account Number: [id number] Date of Birth/Sex: Treating RN: Jun 29, 1970 (48 y.o. [name] Primary Care Zarie Kosiba: [name] Other Clinician: Referring Charnee Turnipseed: Treating Natahlia Hoggard/Extender:[name], [name], [name] in Treatment:  22 Encounter Discharge Information Items Discharge Condition: Stable Ambulatory Status: Wheelchair Discharge Destination: Home Transportation: Private Auto Accompanied By: self Schedule Follow-up Appointment: Yes Clinical Summary of Care: Electronic Signature(s) Signed: 02/09/2019 3:33:55 PM By: Deon Pilling Entered By: Deon Pilling on 02/09/2019 15:33:35 -------------------------------------------------------------------------------- Lower Extremity Assessment Details Patient Name: Date of Service: DONIESHA, LANDAU 02/09/2019 2:00 PM Medical Record Number:[id number] Patient Account Number: [id number] Date of Birth/Sex: Treating RN: 1971/02/06 (49 y.o. [name] Primary Care Mira Balon: [name] Other Clinician: Referring Avrohom Mckelvin: Treating Lyrical Sowle/Extender:[name], [name], [name] in Treatment: 22 Edema Assessment Assessed: [Left: No] [Right: Yes] Edema: [Left: Ye] [Right: s] Calf Left: Right: Point of Measurement: cm From Medial Instep cm 27 cm Ankle Left: Right: Point of Measurement: cm From Medial Instep cm 21 cm Electronic Signature(s) Signed: 02/09/2019 3:33:55 PM By: Deon Pilling Entered By: Deon Pilling on 02/09/2019 14:06:24 -------------------------------------------------------------------------------- Multi Wound Chart Details Patient Name: Date of Service: [name] 02/09/2019 2:00 PM Medical Record Number:[id number] Patient Account Number: [id number] Date of Birth/Sex: Treating RN: 1970-11-21 (48 y.o. Nancy Fetter Primary Care Martavious Hartel: [name] Other Clinician: Referring Jovi Alvizo: Treating Alp Goldwater/Extender:[name], [name], [name] in Treatment: 22 Vital Signs Height(in): 67 Capillary Blood 230 Glucose(mg/dl): Weight(lbs): 125 Pulse(bpm): 68 Body Mass Index(BMI): 20 Blood Pressure(mmHg): 153/74 Temperature(F): 98.7 Respiratory 16 Rate(breaths/min): Photos: [43:No Photos] [44:No Photos]  [46:No Photos] Wound Location: [43:Right Foot - Medial] [44:Right Calcaneus] [46:Right Lower Leg - Lateral] Wounding Event: [43:Gradually Appeared] [44:Gradually Appeared] [46:Other Lesion] 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] [44:Lower Extremity] Comorbid History: [43:Cataracts, Chronic sinus Cataracts, Chronic sinus Cataracts, Chronic sinus problems/congestion, Anemia, Sleep Apnea, Deep Anemia, Sleep Apnea, Deep Anemia, Sleep Apnea, Deep Vein Thrombosis, Hypertension, Peripheral Hypertension,  Peripheral Hypertension, Peripheral Arterial Disease, Type I Diabetes, Osteoarthritis, Osteomyelitis, Neuropathy, Osteomyelitis, Neuropathy, Osteomyelitis, Neuropathy, Seizure Disorder] [  44:problems/congestion, Vein Thrombosis, Arterial Disease, Type I  Diabetes, Osteoarthritis, Seizure Disorder] [46:problems/congestion, Vein Thrombosis, Arterial Disease, Type I Diabetes, Osteoarthritis, Seizure Disorder] Date Acquired: [43:08/04/2018] [44:08/28/2018] [46:12/25/2018] Weeks of Treatment: [43:22] [44:22] [46:6] Wound Status: [43:Open] [44:Open] [46:Open] Clustered Wound: [43:Yes] [44:No] [46:Yes] Clustered Quantity: [43:1] [44:N/A] [46:2] Measurements L x W x D 5.7x4x0.9 [44:0.7x0.5x0.1] [46:1.3x1.5x1.9] (cm) Area (cm) : [38:18.299] [44:0.275] [46:1.532] Volume (cm) : [37:16.967] [44:0.027] [46:2.91] % Reduction in Area: [43:75.30%] [44:89.10%] [46:-1839.20%] % Reduction in Volume: 26.00% [44:89.20%] [46:-3583.50%] Position 1 (o'clock): [46:4] Maximum Distance 1 [46:4.7] (cm): Starting Position 1 [46:9] (o'clock): Ending Position 1 [46:2] (o'clock): Maximum Distance 1 [46:2] (cm): Tunneling: [43:No] [44:No] [46:Yes] Undermining: [43:No] [44:No] [46:Yes] Classification: [43:Grade 4] [44:Grade 2] [46:Full Thickness Without Exposed Support Structures] Exudate Amount: [43:Large] [44:None Present] [46:Large] Exudate Type: [43:Purulent]  [44:N/A] [46:Purulent] Exudate Color: [43:yellow, brown, green] [44:N/A] [46:yellow, brown, green] Wound Margin: [43:Distinct, outline attached] [44:Flat and Intact] [46:Well defined, not attached] Granulation Amount: [43:Large (67-100%)] [44:None Present (0%)] [46:Medium (34-66%)] Granulation Quality: [43:Pink] [44:N/A] [46:Red, Pink] Necrotic Amount: [43:Small (1-33%)] [44:Large (67-100%)] [46:Medium (34-66%)] Necrotic Tissue: [43:Adherent Slough] [44:Eschar] [46:Adherent Slough] Exposed Structures: [43:Fat Layer (Subcutaneous Tissue) Exposed: Yes Tendon: Yes Muscle: Yes Fascia: No Joint: No Bone: No] [44:Fascia: No Fat Layer (Subcutaneous Tissue) Exposed: No Tendon: No Muscle: No Joint: No Bone: No] [46:Fat Layer (Subcutaneous Tissue) Exposed: Yes  Fascia: No Tendon: No Muscle: No Joint: No Bone: No] Epithelialization: [43:None] [44:Large (67-100%)] [46:None] Assessment Notes: [43:N/A 47] [44:scabbed area noted. N/A] [46:N/A N/A] Photos: [43:No Photos] [44:N/A] [46:N/A] Wound Location: [43:Right Lower Leg - Anterior N/A] [46:N/A] Wounding Event: [43:Gradually Appeared] [44:N/A] [46:N/A] Primary Etiology: [43:Abscess] [44:N/A] [46:N/A] Comorbid History: [43:Cataracts, Chronic sinus N/A problems/congestion, Anemia, Sleep Apnea, Deep Vein Thrombosis, Hypertension, Peripheral Arterial Disease, Type I Diabetes, Osteoarthritis, Osteomyelitis, Neuropathy, Seizure Disorder] [46:N/A] Date Acquired: [43:01/31/2019] [44:N/A] [46:N/A] Weeks of Treatment: [43:1] [44:N/A] [46:N/A] Wound Status: [43:Open] [44:N/A] [46:N/A] Clustered Wound: [43:No] [44:N/A] [46:N/A] Clustered Quantity: [43:N/A] [44:N/A] [46:N/A] Measurements L x W x D 0.3x0.4x0.1 [44:N/A] [46:N/A] (cm) Area (cm) : [43:0.094] [44:N/A] [46:N/A] Volume (cm) : [89:3.810] [44:N/A] [46:N/A] % Reduction in Area: [43:78.60%] [44:N/A] [46:N/A] % Reduction in Volume: 79.50% [44:N/A] [46:N/A] Tunneling: [43:No] [44:N/A] [46:N/A] Undermining:  [43:No] [44:N/A] [46:N/A] Classification: [43:Full Thickness Without Exposed Support Structures] [44:N/A] [46:N/A] Exudate Amount: [43:None Present] [44:N/A] [46:N/A] Exudate Type: [43:N/A] [44:N/A] [46:N/A] Exudate Color: [43:N/A] [44:N/A] [46:N/A] Wound Margin: [43:N/A] [44:N/A] [46:N/A] Granulation Amount: [43:None Present (0%)] [44:N/A] [46:N/A] Granulation Quality: [43:N/A] [44:N/A] [46:N/A] Necrotic Amount: [43:None Present (0%)] [44:N/A] [46:N/A] Necrotic Tissue: [43:N/A] [44:N/A] [46:N/A] Exposed Structures: [43:Fat Layer (Subcutaneous N/A Tissue) Exposed: Yes Fascia: No Tendon: No Muscle: No Joint: No Bone: No] [46:N/A] Epithelialization: [43:None scabbed over.] [44:N/A N/A] [46:N/A N/A] Treatment Notes Electronic Signature(s) Signed: 02/09/2019 5:25:49 PM By: Linton Ham MD Signed: 02/09/2019 5:46:53 PM By: [name] RN, BSN Entered By: Linton Ham on 02/09/2019 15:32:03 -------------------------------------------------------------------------------- Multi-Disciplinary Care Plan Details Patient Name: Date of Service: [name] 02/09/2019 2:00 PM Medical Record Number:[id number] Patient Account Number: [id number] Date of Birth/Sex: Treating RN: [birth year] (48 y.o. Nancy Fetter Primary Care George Haggart: [name] Other Clinician: Referring Kyree Adriano: Treating Kiandre Spagnolo/Extender:[name], [name], [name] in Treatment: 22 Active Inactive Wound/Skin Impairment Nursing Diagnoses: Impaired tissue integrity Knowledge deficit related to ulceration/compromised skin integrity Goals: Patient/caregiver will verbalize understanding of skin care regimen Date Initiated: 09/03/2018 Target Resolution Date: 02/27/2019 Goal Status: Active Ulcer/skin breakdown will have a volume reduction of 30% by week 4 Date Initiated: 09/03/2018 Date Inactivated: 10/07/2018 Target Resolution Date: 10/01/2018 Unmet Goal  Status: Unmet Reason: COMORBITIES Ulcer/skin  breakdown will have a volume reduction of 50% by week 8 Date Initiated: 10/07/2018 Date Inactivated: 11/03/2018 Target Resolution Date: 10/31/2018 Unmet Goal Status: Unmet Reason: Osteomyelitis Interventions: Assess patient/caregiver ability to obtain necessary supplies Assess patient/caregiver ability to perform ulcer/skin care regimen upon admission and as needed Assess ulceration(s) every visit Provide education on ulcer and skin care Treatment Activities: Skin care regimen initiated : 09/03/2018 Topical wound management initiated : 09/03/2018 Notes: Electronic Signature(s) Signed: 02/09/2019 5:46:53 PM By: [name] RN, BSN Entered By: [name] on 02/09/2019 13:56:00 -------------------------------------------------------------------------------- Pain Assessment Details Patient Name: Date of Service: [name] 02/09/2019 2:00 PM Medical Record Number:[id number] Patient Account Number: [id number] Date of Birth/Sex: Treating RN: March 22, 1971 (48 y.o. [name] Primary Care Dustyn Armbrister: [name] Other Clinician: Referring Harpreet Pompey: Treating Taydem Cavagnaro/Extender:[name], [name], [name] in Treatment: 22 Active Problems Location of Pain Severity and Description of Pain Patient Has Paino No Site Locations Rate the pain. Current Pain Level: 0 Pain Management and Medication Current Pain Management: Medication: No Cold Application: No Rest: No Massage: No Activity: No T.E.N.S.: No Heat Application: No Leg drop or elevation: No Is the Current Pain Management Adequate: Adequate How does your wound impact your activities of daily livingo Sleep: No Bathing: No Appetite: No Relationship With Others: No Bladder Continence: No Emotions: No Bowel Continence: No Work: No Toileting: No Drive: No Dressing: No Hobbies: No Electronic Signature(s) Signed: 02/09/2019 3:33:55 PM By: Deon Pilling Entered By: Deon Pilling on 02/09/2019  14:06:13 -------------------------------------------------------------------------------- Patient/Caregiver Education Details Patient Name: Date of Service: [name] 11/9/2020andnbsp2:00 PM Medical Record Number:[id number] Patient Account Number: [id number] Date of Birth/Gender: Treating RN: [birth year] (48 y.o. Nancy Fetter Primary Care Physician: [name] Other Clinician: Referring Physician: Treating Physician/Extender:[name], [name], [name] in Treatment: 22 Education Assessment Education Provided To: Patient Education Topics Provided Wound/Skin Impairment: Methods: Explain/Verbal Responses: State content correctly Electronic Signature(s) Signed: 02/09/2019 5:46:53 PM By: [name] RN, BSN Entered By: [name] on 02/09/2019 13:56:19 -------------------------------------------------------------------------------- Wound Assessment Details Patient Name: Date of Service: [name] 02/09/2019 2:00 PM Medical Record Number:[id number] Patient Account Number: [id number] Date of Birth/Sex: Treating RN: 1970/06/04 (48 y.o. [name], Meta.Reding Primary Care Chaise Mahabir: [name] Other Clinician: Referring Emil Klassen: Treating Taegen Delker/Extender:[name], [name], [name] in Treatment: 22 Wound Status Wound Number: 43 Primary Diabetic Wound/Ulcer of the Lower Extremity Etiology: Wound Location: Right Foot - Medial Wound Open Wounding Event: Gradually Appeared Status: Date Acquired: 08/04/2018 Comorbid Cataracts, Chronic sinus problems/congestion, Weeks Of Treatment: 22 History: Anemia, Sleep Apnea, Deep Vein Thrombosis, Clustered Wound: Yes Clustered Wound: Yes Hypertension, Peripheral Arterial Disease, Type I Diabetes, Osteoarthritis, Osteomyelitis, Neuropathy, Seizure Disorder Photos Wound Measurements Length: (cm) 5.7 Width: (cm) 4 Depth: (cm) 0.9 Clustered Quantity: 1 Area: (cm) 17.907 Volume: (cm)  16.116 Wound Description Classification: Grade 4 Wound Margin: Distinct, outline attached Exudate Amount: Large Exudate Type: Purulent Exudate Color: yellow, brown, green Wound Bed Granulation Amount: Large (67-100%) Granulation Quality: Pink Necrotic Amount: Small (1-33%) Necrotic Quality: Adherent Slough r After Cleansing: No ibrino Yes Exposed Structure xposed: No r (Subcutaneous Tissue) Exposed: Yes xposed: Yes xposed: Yes rosis of Muscle: Yes posed: No osed: No % Reduction in Area: 75.3% % Reduction in Volume: 26% Epithelialization: None Tunneling: No Undermining: No Foul Odo Slough/F Fascia E Fat Laye Tendon E Muscle E Nec Joint Ex Bone Exp Treatment Notes Wound #43 (Right, Medial Foot) 1. Cleanse With Wound Cleanser 3. Primary Dressing Applied Calcium Alginate Ag 4.  Secondary Dressing Dry Gauze Roll Gauze 5. Secured With Recruitment consultant) Signed: 02/12/2019 3:27:21 PM By: [name] EMT/HBOT Signed: 02/12/2019 5:17:30 PM By: Deon Pilling Previous Signature: 02/09/2019 3:33:55 PM Version By: Deon Pilling Entered By: [name] on 02/12/2019 11:11:12 -------------------------------------------------------------------------------- Wound Assessment Details Patient Name: Date of Service: [name] 02/09/2019 2:00 PM Medical Record Number:[id number] Patient Account Number: [id number] Date of Birth/Sex: Treating RN: 1970-08-11 (48 y.o. [name], Meta.Reding Primary Care Wilver Tignor: [name] Other Clinician: Referring Tresha Muzio: Treating Drisana Schweickert/Extender:[name], [name], [name] in Treatment: 22 Wound Status Wound Number: 44 Primary Diabetic Wound/Ulcer of the Lower Extremity Etiology: Wound Location: Right Calcaneus Wound Open Wounding Event: Gradually Appeared Status: Date Acquired: 08/28/2018 Comorbid Cataracts, Chronic sinus problems/congestion, Weeks Of Treatment: 22 History: Anemia, Sleep Apnea, Deep  Vein Thrombosis, Clustered Wound: No Hypertension, Peripheral Arterial Disease, Type I Diabetes, Osteoarthritis, Osteomyelitis, Neuropathy, Seizure Disorder Photos Wound Measurements Length: (cm) 0.7 % Reduction in Are Width: (cm) 0.5 % Reduction in Vol Depth: (cm) 0.1 Epithelialization: Area: (cm) 0.275 Tunneling: Volume: (cm) 0.027 Undermining: Wound Description Classification: Grade 2 Foul Odor After Cl Wound Margin: Flat and Intact Slough/Fibrino Exudate Amount: None Present Wound Bed Granulation Amount: None Present (0%) Necrotic Amount: Large (67-100%) Fascia Exposed: Necrotic Quality: Eschar Fat Layer (Subcuta Tendon Exposed: Muscle Exposed: Joint Exposed: Bone Exposed: eansing: No Yes Exposed Structure No neous Tissue) Exposed: No No No No No a: 89.1% ume: 89.2% Large (67-100%) No No Assessment Notes scabbed area noted. Treatment Notes Wound #44 (Right Calcaneus) 1. Cleanse With Wound Cleanser 3. Primary Dressing Applied Calcium Alginate Ag 4. Secondary Dressing Dry Gauze Roll Gauze 5. Secured With Recruitment consultant) Signed: 02/12/2019 3:27:21 PM By: [name] EMT/HBOT Signed: 02/12/2019 5:17:30 PM By: Deon Pilling Previous Signature: 02/09/2019 3:33:55 PM Version By: Deon Pilling Entered By: [name] on 02/12/2019 11:10:42 -------------------------------------------------------------------------------- Wound Assessment Details Patient Name: Date of Service: [name] 02/09/2019 2:00 PM Medical Record Number:[id number] Patient Account Number: [id number] Date of Birth/Sex: Treating RN: 06-29-1970 (48 y.o. [name] Primary Care Deago Burruss: [name] Other Clinician: Referring Dewayne Jurek: Treating Casin Federici/Extender:[name], [name], [name] in Treatment: 22 Wound Status Wound Number: 46 Primary Abscess Etiology: Wound Location: Right Lower Leg - Lateral Wound Open Wounding Event:  Other Lesion Status: Date Acquired: 12/25/2018 Notes: MD IandD today. Weeks Of Treatment: 6 Comorbid Cataracts, Chronic sinus problems/congestion, Clustered Wound: Yes History: Anemia, Sleep Apnea, Deep Vein Thrombosis, Hypertension, Peripheral Arterial Disease, Type I Diabetes, Osteoarthritis, Osteomyelitis, Neuropathy, Seizure Disorder Photos Wound Measurements Length: (cm) 1.3 Width: (cm) 1.5 Depth: (cm) 1.9 Clustered Quantity: 2 Area: (cm) 1.532 Volume: (cm) 2.91 % Reduction in Area: -1839.2% % Reduction in Volume: -3583.5% Epithelialization: None Tunneling: Yes Position (o'clock): 4 Maximum Distance: (cm) 4.7 Undermining: Yes Starting Position (o'clock): 9 Ending Position (o'clock): 2 Maximum Distance: (cm) 2 Wound Description Full Thickness Without Exposed Support Foul Odo Classification: Structures Slough/F Wound Well defined, not attached Margin: Exudate Large Amount: Exudate Purulent Type: Exudate yellow, brown, green Color: Wound Bed Granulation Amount: Medium (34-66%) Granulation Quality: Red, Pink Fascia E Necrotic Amount: Medium (34-66%) Fat Laye Necrotic Quality: Adherent Slough Tendon E Muscle E Joint Ex Bone Exp r After Cleansing: No ibrino Yes Exposed Structure xposed: No r (Subcutaneous Tissue) Exposed: Yes xposed: No xposed: No posed: No osed: No Treatment Notes Wound #46 (Right, Lateral Lower Leg) 1. Cleanse With Wound Cleanser 3. Primary Dressing Applied Calcium Alginate Ag 4. Secondary Dressing Dry Gauze Roll Gauze 5. Secured With Recruitment consultant)  Signed: 02/12/2019 3:27:21 PM By: [name] EMT/HBOT Signed: 02/12/2019 5:17:30 PM By: Deon Pilling Previous Signature: 02/09/2019 3:33:55 PM Version By: Deon Pilling Entered By: [name] on 02/12/2019 11:09:25 -------------------------------------------------------------------------------- Wound Assessment Details Patient Name: Date of  Service: [name] 02/09/2019 2:00 PM Medical Record Number:[id number] Patient Account Number: [id number] Date of Birth/Sex: Treating RN: October 27, 1970 (48 y.o. [name], Meta.Reding Primary Care Yalonda Sample: [name] Other Clinician: Referring Geofrey Silliman: Treating Adebayo Ensminger/Extender:[name], [name], [name] in Treatment: 22 Wound Status Wound Number: 47 Primary Abscess Etiology: Wound Location: Right Lower Leg - Anterior Wound Open Wounding Event: Gradually Appeared Status: Date Acquired: 01/31/2019 Comorbid Cataracts, Chronic sinus problems/congestion, Weeks Of Treatment: 1 History: Anemia, Sleep Apnea, Deep Vein Thrombosis, Clustered Wound: No Hypertension, Peripheral Arterial Disease, Type I Diabetes, Osteoarthritis, Osteomyelitis, Neuropathy, Seizure Disorder Photos Wound Measurements Length: (cm) 0.3 % Reduct Width: (cm) 0.4 % Reduct Depth: (cm) 0.1 Epitheli Area: (cm) 0.094 Tunneli Volume: (cm) 0.009 Undermi Wound Description Classification: Full Thickness Without Exposed Support Foul Odo Structures Slough/F Exudate None Present Amount: Wound Bed Granulation Amount: None Present (0%) Necrotic Amount: None Present (0%) Fascia E Fat Laye Tendon E Muscle E Joint Ex Bone Exp r After Cleansing: No ibrino Yes Exposed Structure xposed: No r (Subcutaneous Tissue) Exposed: Yes xposed: No xposed: No posed: No osed: No ion in Area: 78.6% ion in Volume: 79.5% alization: None ng: No ning: No Assessment Notes scabbed over. Treatment Notes Wound #47 (Right, Anterior Lower Leg) 1. Cleanse With Wound Cleanser 3. Primary Dressing Applied Calcium Alginate Ag 4. Secondary Dressing Dry Gauze Roll Gauze 5. Secured With Recruitment consultant) Signed: 02/12/2019 3:27:21 PM By: [name] EMT/HBOT Signed: 02/12/2019 5:17:30 PM By: Deon Pilling Previous Signature: 02/09/2019 3:33:55 PM Version By: Deon Pilling Entered By: [name] on 02/12/2019 11:10:12 -------------------------------------------------------------------------------- Vitals Details Patient Name: Date of Service: [name] 02/09/2019 2:00 PM Medical Record Number:[id number] Patient Account Number: [id number] Date of Birth/Sex: Treating RN: 08-Sep-1970 (48 y.o. [name], Meta.Reding Primary Care Lucy Woolever: [name] Other Clinician: Referring Carron Jaggi: Treating Hyun Reali/Extender:[name], [name], [name] in Treatment: 22 Vital Signs Time Taken: 13:51 Temperature (F): 98.7 Height (in): 67 Pulse (bpm): 93 Weight (lbs): 125 Respiratory Rate (breaths/min): 16 Body Mass Index (BMI): 19.6 Blood Pressure (mmHg): 153/74 Capillary Blood Glucose (mg/dl): 230 Reference Range: 80 - 120 mg / dl Electronic Signature(s) Signed: 02/09/2019 3:33:55 PM By: Deon Pilling Entered By: Deon Pilling on 02/09/2019 13:58:33

## 2019-02-10 NOTE — Progress Notes (Signed)
Nancy, [name] ([id number]) Visit Report for 02/09/2019 HPI Details Patient Name: Date of Service: [name], [name] 02/09/2019 2:00 PM Medical Record Number:[id number] Patient Account Number: [id number] Date of Birth/Sex: Treating RN: 02-06-1971 (48 y.o. Nancy Fetter Primary Care Provider: [name] Other Clinician: Referring Provider: Treating Provider/Extender:Rasheen Bells, [name], [name] in Treatment: 22 History of Present Illness HPI Description: 48 year old diabetic who is known to have type 1 diabetes which is poorly controlled last hemoglobin A1c was 11%. She comes in with a ulcerated area on the left lateral foot which has been there for over 6 months. Was recently she has been treated by [name] of podiatry who saw her last on 05/28/2016. Review of his notes revealed that the patient had incision and drainage with placement of antibiotic beads to the left foot on 04/11/2016 for possible osteomyelitis of the cuboid bone. Over the last year she's had a history of amputation of the left fifth toe and a femoropopliteal popliteal bypass graft somewhere in April 2017. 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 His note [name] mentions that the patient has been referred to me for further wound care and possibly great candidate for hyperbaric oxygen therapy due to recurrent osteomyelitis. However we do not have any x-rays of biopsy reports confirming this. He has been on several antibiotics including Bactrim and most recently is on doxycycline for an MRSA. I understand, the patient was not a candidate for IV antibiotics as she has had previous PICC lines which resulted in blood clots in both arms. There was a x-ray report dated 04/04/2016 on [name] notes which showed evidence of fifth ray resection left foot with osteolytic changes noted to the fourth metatarsal and cuboid bone on the left. 06/13/2016 -- had a left foot x-ray which showed no  acute fracture or dislocation and no definite radiographic evidence of osteomyelitis. Advanced osteopenia was seen. 06/20/2016 -- she has noticed a new wound on the right plantar foot in the region where she had a callus before. 06/27/16- the patient did have her x-ray of the right foot which showed no findings to suggest osteomyelitis. She saw her endocrinologist, [name], yesterday. Her A1c in January was 11. He also indicates mismanagement and noncompliance regarding her diabetes. She is currently on Bactrim for a lip infection. She is complaining of nausea, vomiting and diarrhea. She is unable to articulate the exact orders or dosing of the Bactrim; it is unclear when she will complete this. 07/04/2016 -- results from [facility] of ABIs with ankle waveforms were noted from 02/14/2016. The examination done on 06/27/2015 showed noncompressible ABIs with the right being 1.45 and the left being 1.33. The present examination showed a right ABI of 1.19 on the left of 1.33. The conclusion was that right normal ABI in the lower extremity at rest however compared to previous study which was noncompressible ABI may be falsely elevated side suggesting medial calcification. The left ABI suggested medial calcification. 08/01/2016 -- the patient had more redness and pain on her right foot and did not get to come to see as noted she see her PCP or go to the ER and decided to take some leftover metronidazole which she had at home. As usual, the patient does report she feels and is rather noncompliant. 08/08/2016 -- -- x-ray of the right foot -- FINDINGS:Transmetatarsal amputation is noted. No bony destruction is noted to suggest osteomyelitis. IMPRESSION: No evidence of osteomyelitis. Postsurgical changes are seen. MRI would be more sensitive  for possible bony changes. Culture has grown Serratia Marcescens -- sensitive to Bactrim, ciprofloxacin, ceftazidime she was seen by [name] on 08/06/2016.  He did not find any exposed bone, muscle, tendon, ligament or joint. There was no malodor and he did a excisional debridement in the office. ============ Old notes: 48 year old patient who is known to the wound clinic for a while had been away from the wound clinic since 09/01/2014. Over the last several months she has been admitted to various hospitals including [place] at [facility]. She was treated for a right metatarsal osteomyelitis with a transmetatarsal amputation and this was done about 2 months ago. He has a small ulcerated area on the right heel and she continues to have an ulcerated area on the left plantar aspect of the foot. The patient was recently admitted to the [facility] hospital group between 7/12 and 10/18/2014. she was given 3 weeks of IV vancomycin and was to follow-up with her surgeons at [facility] and also took oral vancomycin for C. difficile colitis. 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 MRI of the left foot done on 10/14/2014 showed no abscess or osteomyelitis. 04/27/15; this is a patient we know from previous stays in the wound care center. She is a type I diabetic I am not sure of her control currently. Since the last time I saw her she is had a right transmetatarsal amputation and has no wounds on her right foot and has no open wounds. She is been followed at the wound care center at [facility] in [place]. She comes today with the desire to undergo hyperbaric treatment locally. Apparently one of her wound care providers in [place] has suggested hyperbarics. This is in response to an MRI from 04/18/15 that showed increased marrow signal and loss of the proximal fifth  metatarsal cortex evidence of osteomyelitis with likely early osteomyelitis in the cuboid bone as well. She has a large wound over the base of the fifth metatarsal. She also has a eschar over her the tips of her toes on 1,3 and 5. She does not have peripheral pulses and apparently is going for an angiogram tomorrow which seems reasonable. After this she is going to infectious disease at [facility]. They have been using Medihoney to the large wound on the lateral aspect of the left foot to. The patient has known Charcot deformity from diabetic neuropathy. She also has known diabetic PAD. Surprisingly I can't see that she has had any recent antibiotics, the patient states the last antibiotic she had was at the end of November for 10 days. I think this was in response to culture that showed group G strep although I'm not exactly sure where the culture was from. She is also had arterial studies on 03/29/15. This showed a right ABI of 1.4 that was noncompressible. Her left ABI was 0.73. 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 It was not felt that arterial inflow was adequate for healing of a foot ulcer. Her Doppler waveforms looked monophasic ===== READMISSION 02/28/17; this is in an now 48 year old woman we've had at several different occasions in this clinic. 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 She has a remote ex-smoker. She was last seen in this  clinic by Dr. Con Memos I think in May. More recently she is been followed by her podiatrist [name] an infectious disease Dr. Megan Salon. She has 2 open wounds the major one is over the right first metatarsal head she also has a wound on the left plantar foot. an MRI of the right foot on 01/01/17 showed a soft tissue ulcer along the plantar aspect of the first metatarsal base consistent with osteomyelitis of the first metatarsal stump. Dr. 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 According to the patient this is been open for slightly over a month. She has been on a combination of Cipro 500 twice a day, Zyvox 600 twice a day and Flagyl 500 3 times a day for over a month now as directed by Dr. Megan Salon. cultures of the right foot earlier this year showed MRSA in January and Serratia in May. January also had a few viridans strep. Recent x-rays of both feet were done and [name] office and I don't have these reports. The patient has known PAD and has a history of aleft femoropopliteal bypass in April 2017. 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 Dwyane Dee. In spite of this the last hemoglobin A1c I can see is 10.1 on 01/01/2017. She is being referred by 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 She is been using Medihoney to this area and also an area on the left midfoot. She is using healing sandals bilaterally. ABIs in this clinic at the left posterior tibial was 1.1 noncompressible on the right READMISSION Non invasive vascular NOVANT 5/18 Aftercare following surgery of the circulatory system Procedure Note - Interface, External Ris In - 08/13/2016 11:05 AM EDT Procedure: Examination consists of physiologic resting arterial pressures of the brachial and ankle arteries bilaterally with continuous wave Doppler waveform analysis. Previous: Previous exam performed on 02/14/16 demonstrated ABIs of Rt = 1.19 and Lt = 1.33. Right: ABI = non-compressible PT, 1.47 DP. S/P transmet amputation. Left: ABI = 1.52, 2nd digit pressure = 87 mmHg Conclusions: Right: ABI (>1.3) may be falsely elevated, suggesting medial calcification. Left: ABI (>1.3) may be falsely  elevated, suggesting medial calcification The patient is a now 49 year old type I diabetic is had multiple issues her graded to chronic diabetic foot ulcers. 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 We had her in the clinic lastin November. At that point she had wounds on her bilateral feet.she had wanted to try hyperbarics however the healogics review process denied her because she hadn't followed up with her vascular surgeon for her left femoropopliteal bypass. The bypass was done by Dr. Raul Del at [facility]. We made her a follow-up with Dr. Raul Del however she did not keep the appointment and therefore she was not approved The patient shows me a small wound on her left fourth metatarsal head on her phone. She developed rapid discoloration in the plantar aspect of the left foot and she was admitted to hospital from 2/2 through 05/10/17 with wet gangrene of the left foot osteomyelitis of the fourth metatarsal heads. She was admitted acutely ill with a temperature of 103. She was started on broad-spectrum vancomycin and cefepime. On 05/06/17  she was taken to the OR by [name] her podiatric surgeon for an incision and drainage irrigation of the left foot wound. Cultures from this surgery revealed group be strep and anaerobes. she was seen by Dr.Xu of orthopedic surgery and scheduled for a below-knee amputation which she u refused. Ultimately she was discharged on Levaquin and Flagyl for one month. MRI 05/05/17 done while she was in the hospital showed abscess adjacent to the fourth metatarsal head and neck small abscess around the fourth flexor tendon. Inflammatory phlegmon and gas in the soft tissues along the lateral aspect of the fourth phalanx. Findings worrisome for osteomyelitis involving the fourth proximal and middle phalanx and also the third and fourth metatarsals. Finally the patient had actually shortly  before this followed up with Dr. Raul Del at no time on 04/29/17. He felt that her left femoropopliteal bypass was patent he felt that her left-sided toe pressures more than adequate for healing a wound on the left foot. This was before her acute presentation. 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 05/28/17; she is started hyperbarics. The patient tells me that for some reason she was not actually on Levaquin but I think on ciprofloxacin. She was on Flagyl. She only started her Levaquin yesterday due to some difficulty with the pharmacy and perhaps her sister picking it up. She has an appointment with [name] tomorrow and with infectious disease early next week. She has no new complaints 06/06/17; the patient continues in hyperbarics. She saw [name] on 05/29/17 who is her podiatric surgeon. He is elected for a transmetatarsal amputation on 06/27/17. I'm not sure at what level he plans to do this amputation. The patient is unaware She also saw Dr. Megan Salon of infectious disease who elected to continue her on current antibiotics I think this is ciprofloxacin and Flagyl. I'll need to clarify with her tomorrow if she actually has this. We're using silver alginate to the actual wound. Necrotic surface today with material under the flap of her foot. Original MRI showed abscesses as well as osteomyelitis of the proximal and middle fourth phalanx and the third and fourth metatarsal heads 06/11/17; patient continues in hyperbarics and continues on oral antibiotics. She is doing well. The wound looks better. The necrotic part of this under the flap in her superior foot also looks better. she is been to see [name]. I haven't had a chance to look at his note. Apparently he has put the transmetatarsal amputation on hold her request it is still planning to take her to the OR for debridement and product application ACEL. I'll see if I can find his note. I'll therefore leave product ordering/requests to Dr.  [name] for now. I was going to look at Dermagraft 06/18/17-she is here in follow-up evaluation for bilateral foot wounds. She continues with hyperbaric therapy. She states she has been applying manuka honey to the right plantar foot and alternate manuka honey and silver alginate to the left foot, despite our orders. We will continue with same treatment plan and she will follo up next week. 06/25/17; I have reviewed [name] last note from 3/11. She has operative debridement in 2 days' time. By review his note apparently they're going to place there is skin over the majority of this wound which is a good choice. She has a small satellite area at the most proximal part of this wound on the left plantar foot. The area on the right plantar foot we've been using silver alginate and it is  close to healing. 07/02/17; unfortunately the patient was not easily approved for [name] proposed surgery. I'm not completely certain what the issue is. She has been using silver alginate to the wound she has completed a first course of hyperbarics. She is still on Levaquin and Flagyl. I have really lost track of the time course here.I suspect she should have another week to 2 of antibiotics. I'll need to see if she is followed up with infectious disease Dr. Megan Salon 07/09/17; the patient is followed up with Dr. Megan Salon. 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 She continues on Levaquin and metronidazole continuing both of these for now I think she is been on fr about 6 weeks. She still has some drainage but no pain. No fever. Her had been plans for her to go to the OR for operative debridement with her podiatrist [name], I am not exactly sure where that is. I'll probably slip a note to [name] today. I note that she follows with Dr. Dwyane Dee of endocrinology. We have her recertified for hyperbaric oxygen. I have not heard about Dermagraft however I'll see if [name] is  planning a skin substitute as well 07/16/17; the patient tells me she is just about out of Holt. I'll need to check Dr. Hale Bogus last notes on this. She states she has plenty of Flagyl however. She comes in today complaining of pain in the right lateral foot which she said lasted for about a day. The wound on the right foot is actually much more medially. She also tells me that the [facility] cost a lot of pain in the left foot wound and she turned back to silver alginate. Finally Dermagraft has a $382 per application co-pay. She cannot afford this 07/23/17; patient arrives today with the wound not much smaller. There is not much new to add. She has not heard from [name] all try to put in a call to them today. She was asking about Dermagraft again and she has an over $505 per application co-pay she states that she would be willing to try to do a payment plan. I been tried to avoid this. We've been using silver alginate, I'll change to [facility] 07/30/17-She is here in follow-up evaluation for left foot ulcer. She continues hyperbaric medicine. The left foot ulcer is stable we will continue with same treatment plan 08/06/17; she is here for evaluation of her left foot ulcer. Currently being treated for hyperbarics or underlying osteomyelitis. She is completed antibiotics. The left foot ulcer is better smaller with healthier looking granulation. For various reasons I am not really clear on we never got her back to the OR with [name]. He did not respond to my secure text message. Nevertheless I think that surgery on this point is not necessary nor am I completely clear that a skin substitute is necessary The patient is complaining about pain on the outside of her right foot. 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 There is no erythema. She also states the foot is warm versus her other part of her upper leg and this is largely true. It is not totally clear to me what's  causing this. She thinks it's different from her usual neuropathy pain 08/13/17; she arrives in clinic today with a small wound which is superficial on her right first metatarsal head. 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 She tells [place] she was up on  her feet over the Mother's Day celebration. The large wound is on the left foot. Continues with hyperbarics for underlying osteomyelitis. We're using Hydrofera Blue. She asked me today about where we were with Dermagraft. I had actually excluded this because of the co- pay however she wants to assume this therefore I'll recheck the co-pay an order for next week. 08/20/17; the patient agreed to accept the co-pay of the first Apligraf which we applied today. She is disappointed she is finishing hyperbarics will run this through the insurance on the extent of the foot infection and the extent of the wound that she had however she is already had 60 dive's. Dermagraft No. 1 08/27/17; Dermagraft No. 2. She is not eligible for any more hyperbaric treatments this month. She reports a fair amount of drainage and she actually changed to the external dressings without disturbing the direct contact layer 09/03/17; the patient arrived in clinic today with the wound superficially looking quite healthy. Nice vibrant red tissue with some advancing epithelialization although not as much adherence of the flap as I might like. However she noted on her own fourth toe some bogginess and she brought that to our attention. Indeed this was boggy feeling like a possibility of subcutaneous fluid. 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 She is not been unwell. We've been using Dermagraft 09/10/17; the culture that I did not last week was MRSA. She saw Dr. Megan Salon this morning who is going to start her on vancomycin. I had sent him a secure a text message yesterday. I also spoke with  her podiatric surgeon [name] about surgery on this foot the options for conserving a functional foot etc. Promised me he would see her and will make back consultation today. Paradoxically her actual wound on the plantar aspect of her left foot looks really quite good. I had given her 5 days worth of Baxdella to cover her for MRSA. Her MRI came back showing osteomyelitis within the third metatarsal shaft and head and base of the third and fourth proximal phalanx. She had extensive inflammatory changes throughout the soft tissue of the lateral forefoot. With an ill-defined fluid around the fourth metatarsal extending into the plantar and dorsal soft tissues 09/19/17; the patient is actually on oral Septra and Flagyl. She apparently refused IV vancomycin. She also saw [name] at my request who is planning her for a left BKA sometime in mid July. MRI showed osteomyelitis within the third metatarsal shaft and head and the basis of the third and fourth proximal phalanx. I believe there was felt to be possible septic arthritis involving the third MTP. 09/26/17; the patient went back to Dr. Megan Salon at my suggestion and is now receiving IV daptomycin. Her wound continues to look quite good making the decision to proceed with a transmetatarsal amputation although more difficult for the patient. I believe in my extensive discussions with her she has a good sense of the pros and cons of this. I don't NV the tuft decision she has to make. She has an appointment with [name] I believe in mid July and I previously spoken to him about this issue Has we had used 3 previous Dermagraft. Given the condition of the wound surface I went ahead and added the fourth one today area and I did this not fully realizing that she'll be traveling to [place] next week. I'm hopeful she can come  back in 2 weeks 10/21/17; Her same Dermagraft on for about 3-1/2 weeks. In spite of this the wound arrives looking quite  healthy. There is been a lot of healing dimensions are smaller. Looking at the square shaped wound she has now there is some undermining and some depth medially under the undermining although I cannot palpate any bone. No surrounding infection is obvious. She has difficult questions about how to look at this going forward vis--vis amputations versus continued medical therapy. To be truthful the wound is looks so healthy and it is continued to contract. Hard to justify foot surgery at this point although I still told her that I think it might come to that if we are not able to eradicate the underlying MRSA. She is still highly at risk and she understands this 11/06/17 on evaluation today patient appears to be doing better in regard to her foot ulcer. She's been tolerating the dressing changes without complication. Currently she is here for her Dermagraft #6. Her wound continues to make excellent progress at this point. She does not appear to have any evidence of infection which is good news. 11/13/17 on evaluation today patient appears to be doing excellent at this time. She is here for repeat Dermagraft application. This is #7. Overall her wound seems to be making great progress. 12/05/17; the patient arrives with the wound in much better condition than when I last saw this almost 6 weeks ago. She still has a small probing area in the left metatarsal head region on the lateral aspect of her foot. We applied her last Dermagraft today. Since the last time she is here she has what appears to a been a blood blister on the plantar aspect of left foot although I don't see this is threatening. There is also a thick raised tissue on the right mid metatarsal head region. This was not there I don't think the last time she was here 3 weeks ago. 12/12/17; the patient continues to have a small programming area in the left metatarsal head region on the lateral aspect of her foot which was the initial large surgical  wound. I applied her last Apligraf last week. I'm going to use Endoform starting today Unfortunately she has an excoriated area in the left mid foot and the right mid foot. The left midfoot looks like a blistered area this was not opened last week it certainly is open today. Using silver alginate on these areas. She promises me she is offloading this. 12/19/17; the small probing area in the left metatarsal head eyes think is shallower. In general her original wound looks better. We've been using Endoform. The area inferiorly that I think was trauma last week still requires debridement a lot of nonviable surface which I removed. She still has an open open area distally in her foot Similarly on the right foot there is tightly adherent surface debris which I removed. Still areas that don't look completely epithelialized. This is a small open area. We used silver alginate on these areas 12/26/2017; the patient did not have the supplies we ordered from last week including the Endoform. The original large wound on the left lateral foot looks healthy. She still has the undermining area that is largely unchanged from last week. She has the same heavily callused raised edged wounds on the right mid and left midfoot. Both of these requiring debridement. We have been using silver alginate on these areas 01/02/2018; there is still supply issues. We are going to try  to use Prisma but I am not sure she actually got it from what she is saying. She has a new open area on the lateral aspect of the left fourth toe [previous fifth ray amputation]. Still the one tunneling area over the fourth metatarsal head. The area is in the midfoot bilaterally still have thick callus around them. She is concerned about a raised swelling on the lateral aspect of the foot. However she is completely insensate 01/10/2018; we are using Prisma to the wounds on her bilateral feet. Surprisingly the tunneling area over the left fourth  metatarsal head that was part of her original surgery has closed down. She has a small open area remaining on the incision line. 2 open areas in the midfoot. 02/10/2018; the patient arrives back in clinic after a month hiatus. She was traveling to visit family in [place]. Is fairly clear she was not offloading the areas on her feet. The original wound over the left lateral foot at the level of metatarsal heads is reopened and probes medially by about a centimeter or 2. She notes that a week ago she had purulent drainage come out of an area on the left midfoot. Paradoxically the worst area is actually on the right foot is extensive with purulent drainage. We will use silver alginate today 02/17/2018; the patient has 3 wounds one over the left lateral foot. She still has a small area over the metatarsal heads which is the remnant of her original surgical wound. This has medial probing depth of roughly 1.4 cm somewhat better than last week. The area on the right foot is larger. We have been using silver alginate to all areas. The area on the right foot and left foot that we cultured last week showed both Klebsiella and Proteus. Both of these are quinolone sensitive. The patient put her's self on Bactrim and Flagyl that she had left hanging around from prior antibiotic usages. She was apparently on this last week when she arrived. I did not realize this. Unfortunately the Bactrim will not cover either 1 of these organisms. We will send in Cipro 500 twice daily for a week 03/04/2018; the patient has 2 wounds on the left foot one is the original wound which was a surgical wound for a deep DFU. At one point this had exposed bone. She still has an area over the fourth metatarsal head that probes about 1.4 cm although I think this is better than last week. I been using silver nitrate to try and promote tissue adherence and been using silver alginate here. She also has an area in the left midfoot. This has  some depth but a small linear wound. Still requiring debridement. On the right midfoot is a circular wound. A lot of thick callus around this area. We have been using silver alginate to all wound areas She is completed the ciprofloxacin I gave her 2 weeks ago. 03/11/2018; the patient continues to have 2 open areas on the left foot 1 of which was the original surgical wound for a deep DFU. Only a small probing area remains although this is not much different from last week we have been using silver alginate. The other area is on the midfoot this is smaller linear but still with some depth. We have been using silver alginate here as well On the right foot she has a small circular wound in the mid aspect. This is not much smaller than last time. We have been using silver alginate here as well  03/18/2018; she has 3 wounds on the left foot the original surgical wound, a very superficial wound in the mid aspect and then finally the area in the mid plantar foot. She arrives in today with a very concerning area in the wound in the mid plantar foot which is her most proximal wound. There is undermining here of roughly 1-1/2 cm superiorly. Serosanguineous drainage. She tells me she had some pain on for over the weekend that shot up her foot into her thigh and she tells me that she had a nodule in the groin area. She has the single wound in the right foot. We are using endoform to both wound areas 03/24/2018; the patient arrives with the original surgical wound in the area on the left midfoot about the same as last week. There is a collection of fluid under the surface of the skin extending from the surgical wound towards the midfoot although it does not reach the midfoot wound. The area on the right foot is about the same. Cultures from last week of the left midfoot wound showed abundant Klebsiella abundant Enterococcus faecalis and moderate methicillin resistant staph I gave her Levaquin but this would  have only covered the Klebsiella. She will need linezolid 04/01/2018; she is taking linezolid but for the first few days only took 1 a day. I have advised her to finish this at twice daily dosing. In any case all of her wounds are a lot better especially on the left foot. The original surgical wound is closed. The area on the left midfoot considerably smaller. The area on the right foot also smaller. 04/08/2018; her original surgical wound/osteomyelitis on the left foot remains closed. She has area on the left foot that is in the midfoot area but she had some streaking towards this. This is not connected with her original wound at least not visually. Small wound on the right midfoot appears somewhat smaller. 04/15/18; both wounds looks better. Original wound is better left midfoot. Using silver alginate 1/21; patient states she uses saltwater soak in, stones or remove callus from around her wounds. She is also concerned about a blood blister she had on the left foot but it simply resolved on its own. We've been using silver alginate 1/28; the patient arrives today with the same streaking area from her metatarsals laterally [the site of her original surgical wound] down to the middle of her foot. There is some drainage in the subcutaneous area here. This concerns me that there is actually continued ongoing infection in the metatarsals probably the fourth and third. This fixates an MRI of the foot without contrast [chronic renal failure] The wound in the mid part of the foot is small but I wonder whether this area actually connects with the more distal foot. The area on the right midfoot is probably about the same. Callus thick skin around the small wound which I removed with a curette we have been using silver alginate on both wound areas 2/4; culture I did of the draining site on the left foot last time grew methicillin sensitive staph aureus. MRI of the left foot showed interval resolution of the  findings surrounding the third metatarsal joint on the prior study consistent with treated osteomyelitis. Chronic soft tissue ulceration in the plantar and lateral aspect of the forefoot without residual focal fluid collection. No evidence of recurrent osteomyelitis. Noted to have the previous amputation of the distal first phalanx and fifth ray MRI of the right foot showed no evidence of  osteomyelitis I am going to treat the patient with a prolonged course of antibiotics directed against MSSA in the left foot 2/11; patient continues on cephalexin. She tells me she had nausea and vomiting over the weekend and missed 2 days. In general her foot looks much the same. She has a small open area just below the left fourth metatarsal head. A linear area in the left midfoot. Some discoloration extending from the inferior part of this into the left lateral foot although this appears to be superficial. She has a small area on the right midfoot which generally looks smaller after debridement 2/18; the patient is completing his cephalexin and has another 2 days. She continues to have open areas on the left and right foot. 2/25; she is now off antibiotics. The area on the left foot at the site of her original surgical wound has closed yet again. She still has open areas in the mid part of her foot however these appear smaller. The area on the right mid foot looks about the same. We have been using silver alginate She tells me she had a serious hypoglycemic spell at home. She had to have EMS called and get IV dextrose 3/3; disappointing on the left lateral foot large area of necrotic tissue surrounding the linear area. This appears to track up towards the same original surgical wound. Required extensive debridement. The area on the right plantar foot is not a lot better also using silver 3/12; the culture I did last time showed abundant enterococcus. I have prescribed Augmentin, should cover any unrecognized  anaerobes as well. In addition there were a few MRSA and Serratia that would not be well covered although I did not want to give her multiple antibiotics. She comes in today with a new wound in the right midfoot this is not connected with the original wound over her MTP a lot of thick callus tissue around both wounds but once again she said she is not walking on these areas 3/17-Patient comes in for follow-up on the bilateral plantar wounds, the right midfoot and the left plantar wound. Both these are heavily callused surrounding the wounds. We are continuing to use silver alginate, she is compliant with offloading and states she uses a wheelchair fairly often at home 3/24; both wound areas have thick callus. However things actually look quite a bit better here for the majority of her left foot and the right foot. 3/31; patient continues to have thick callused somewhat irritated looking tissue around the wounds which individually are fairly superficial. There is no evidence of surrounding infection. We have been using silver alginate however I change that to [facility] today 4/17; patient returns to clinic after having a scare with Covid she tested negative in her primary doctor's office. She has been using Hydrofera Blue. She does not have an open area on the right foot. On the left foot she has a small open area with the mid area not completely viable. She showed me pictures of what looks like a hemorrhagic blister from several days ago but that seems to have healed over this was on the lateral left foot 4/21; patient comes in to clinic with both her wounds on her feet closed. However over the weekend she started having pain in her right foot and leg up into the thigh. She felt as though she was running a low-grade fever but did not take her temperature. She took a doxycycline that she had leftover and yesterday a single Septra  and metronidazole. She thinks things feel somewhat  better. 4/28; duplex ultrasound I ordered last week was negative for DVT or superficial thrombophlebitis. She is completed the doxycycline I gave her. States she is still having a lot of pain in the right calf and right ankle which is no better than last week. She cannot sleep. She also states she has a temperature of up to 101, coughing and complaining of visual loss in her bilateral eyes. Apparently she was tested for Covid 2 weeks ago at [facility] and that was negative. Readmission: 09/03/18 patient presents back for reevaluation after having been evaluated at the end of April regarding erythema and swelling of her right lower extremity. Subsequently she ended up going to the hospital on 07/29/18 and was admitted not to be discharged until 08/08/18. Unfortunately it was noted during the time that she was in the hospital that she did have methicillin-resistant Staphylococcus aureus as the infection noted at the site. It was also determined that she did have osteomyelitis which appears to be fairly significant. She was treated with vancomycin and in fact is still on IV vancomycin at dialysis currently. This is actually slated to continue until 09/12/18 at least which will be the completion of the six weeks of therapy. Nonetheless based on what I'm seeing at this point I'm not sure she will be anywhere near ready to discontinue antibiotics at that time. Since she was released from the hospital she was seen by [name] who is her podiatrist on 08/27/18. His note specifically states that he is recommended that the patient needs of one knee amputation on the right as she has a life-threatening situation that can lead quickly to sepsis. The patient advised she would like to try to save her leg to which [name] apparently told her that this was against all medical advice. She also want to discontinue the Wound VAC which had been initiated due to the fact that she wasn't pleased with how the wound was looking  and subsequently she wanted to pursue applying Medihoney at that time. He stated that he did not believe that the right lower extremity was salvageable and that the patient understood but would still like to attempt hyperbaric option therapy if it could be of any benefit. She was therefore referred back to [place] for further evaluation. He plans to see her back next week. Upon inspection today patient has a significant amount purulent drainage noted from the wound at this point. The bone in the distal portion of her foot also appears to be extremely necrotic and spongy. When I push down on the bone it bubbles and seeps purulent drainage from deeper in the end of the foot. I do not think that this is likely going to heal very well at all and less aggressive surgical debridement were undertaken more than what I believe we can likely do here in our office. 09/12/2018; I have not seen this patient since the most recent hospitalization although she was in our clinic last week. I have reviewed some of her records from a complex hospitalization. 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 There is situation was complicated by MRSA bacteremia and acute on chronic renal failure now on dialysis. She is receiving vancomycin at dialysis. We started her on Dakin's wet-to- dry last week she is changing this daily. There is still purulent drainage coming out of her foot. Although she is apparently "agreeable" to a below-knee amputation which  is been suggested by multiple clinicians she wants this to be done in [place]. She apparently has a telehealth visit with that provider sometime in late [place] 6/24. I have told her I think this is probably too long. Nevertheless I could not convince her to allow a local doctor to perform BKA. 09/19/2018; the patient has a large necrotic area on the right anterior foot. She has had previous transmetatarsal amputations. Culture I did  last week showed MRSA nothing else she is on vancomycin at dialysis. She has continued leaking purulent drainage out of the distal part of the large circular wound on the right anterior foot. She apparently went to see Dr. 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 Somehow that translated into her being referred to plastic surgery for debridement of the area. I gather she basically refused amputation although I do not have a copy of Dr. Berenice Primas notes. The patient really wants to have a trial of hyperbaric oxygen. I agreed with initial assessment in this clinic that this was probably too far along to benefit however if she is going to have plastic surgery I think she would benefit from ancillary hyperbaric oxygen. The issue here is that the patient has benefited as maximally as any patient I have ever seen from hyperbaric oxygen therapy. Most recently she had exposed bone on the lateral part of her left foot after a surgical procedure and that actually has closed. She has eschared areas in both heels but no open area. She is remained systemically well. I am not optimistic that anything can be done about this but the patient is very clear that she wants an attempt. The attempt would include a wound VAC further debridements and hyperbaric oxygen along with IV antibiotics. 6/26; I put her in for a trial of hyperbaric oxygen only because of the dramatic response she has had with wounds on her left midfoot earlier this year which was a surgical wound that went straight to her bone over the metatarsal heads and also remotely the left third toe. We will see if we can get this through our review process and insurance. She arrives in clinic with again purulent material pouring out of necrotic bone on the top of the foot distally. There is also some concerning erythema on the front of the leg that we marked. It is bit difficult to tell how tender this is because of neuropathy. I  note from infectious disease that she had her vancomycin extended. 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 She had the wound VAC on for part of the week. The rest of the time she is putting various things on this including Medihoney, "ionized water" silver sorb gel etc. 7/7; follow-up along with HBO. She is still on vancomycin at dialysis. She has a large open area on the dorsal right foot and a small dark eschar area on her heel. There is a lot less erythema in the area and a lot less tenderness. From an infection point of view I think this is better. She still has a lot of necrosis in the remaining right forefoot [previous TMA] we are still using the wound VAC in this area 7/16; follow-up along with HBO. I put her on linezolid after she finished her vancomycin. We started this last Friday I gave her 2 weeks worth. I had the expectation that she would be operatively debrided by Dr. Marla Roe but that still has not happened yet. Patient phoned the  office this week. She arrives for review today after HBO. The distal part of this wound is completely necrotic. Nonviable pieces of tendon bone was still purulent drainage. Also concerning that she has black eschar over the heel that is expanding. I think this may be indicative of infection in this area as well. She has less erythema and warmth in the ankle and calf but still an abnormal exam 7/21 follow-up along with HBO. I will renew her linezolid after checking a CBC with differential monitoring her blood counts especially her platelets. She was supposed to have surgery yesterday but if I am reading things correctly this was canceled after her blood sugar was found to be over 500. I thought Dr. Marla Roe who called me said that they were sending her to the ER but the patient states that was not the case. 7/28. Follow-up along with HBO. She is on linezolid I still do not have any lab work from dialysis even though  I called last week. The patient is concerned about an area on her left lateral foot about the level of the base of her fifth metatarsal. I did not really see anything that ominous here however this patient is in [place] ability to point out problems that she is sensing and she has been accurate in the past Finally she received a call from Dr. Marla Roe who is referring her to another orthopedic surgeon stating that she is too booked up to take her to the operating room now. Was still using a wound VAC on the foot 8/3 -Follow-up after HBO, she is got another week of linezolid, she is to call ID for an appointment, x-rays of both feet were reviewed, the left foot x-ray with third MTP joint osteo- Right foot x-ray widespread osteo-in the right midfoot Right ankle x-ray does not show any active evidence of infection 8/11-Patient is seen after HBO, the wounds on the right foot appear to be about the same, the heel wound had some necrotic base over tendon that was debrided with a curette 8/21; patient is seen after HBO. The patient's wound on her dorsal foot actually looks reasonably good and there is substantial amount of epithelialization however the open area distally still has a lot of necrotic debris partially bone. I cannot really get a good sense of just how deep this probes under the foot. She has been pressuring me this week to order medical maggots through a company in [place] for her. The problem I have is there is not a defined wound area here. On the positive side there is no purulence. She has been to see infectious disease she is still on Septra DS although I have not had a chance to review their notes 8/28; patient is seen in conjunction with HBO. The wounds on her foot continued to improve including the right dorsal foot substantially the, the distal part of this wound and the area on the right heel. We have been using a wound VAC over this chronically. She is still on trimethoprim  as directed by infectious disease 9/4; patient is seen in conjunction with HBO. Right dorsal foot wound substantially anteriorly is better however she continues to have a deep wound in the distal part of this that is not responding. We have been using silver collagen under border foam Area on the right plantar medial heel seems better. We have been using Hydrofera Blue 12/12/18 on evaluation today patient appears to be doing about the same with regard to her wound based  on prior measurements. She does have some necrotic tissue noted on the lateral aspect of the wound that is going require a little bit of sharp debridement today. This includes what appears to be potentially either severely necrotic bone or tendon. Nonetheless other than that she does not appear to have any severe infection which is good news 9/18; it is been 2 weeks since I saw this wound. She is tolerating HBO well. Continued dramatic improvement in the area on the right dorsal foot. She still has a small wound on the heel that we have been using Hydrofera Blue. She continues with a wound VAC 9/24; patient has to be seen emergently today with a swelling on her right lateral lower leg. She says that she told Dr. Evette Doffing about this and also myself on a couple of occasions but I really have no recollection of this. She is not systemically unwell and her wound really looked good the last time I saw this. She showed this to providers at dialysis and she was able to verify that she was started on cephalexin today for 5 doses at dialysis. She dialyzes on Tuesday Thursday and Saturday. 10/2; patient is seen in conjunction with HBO. The area that is draining on the right anterior medial tibia is more extensive. Copious amounts of serosanguineous drainage with some purulence. We are still using the wound VAC on the original wound then it is stable. Culture I did of the original IandD showed MRSA I contacted dialysis she is now on vancomycin  with dialysis treatments. I asked them to run a month 10/9; patient seen in conjunction with HBO. She had a new spontaneous open area just above the wound on the right medial tibia ankle. More swelling on the right medial tibia. Her wound on the foot looks about the same perhaps slightly better. There is no warmth spreading up her leg but no obvious erythema. her MRI of the foot and ankle and distal tib-fib is not booked for next Friday I discussed this with her in great detail over multiple days. it is likely she has spreading infection upper leg at least involving the distal 25% above the ankle. She knows that if I refer her to orthopedics for infectious disease they are going to recommend amputation and indeed I am not against this myself. We had a good trial at trying to heal the foot which is what she wanted along with antibiotics debridement and HBO however she clearly has spreading infection [probably staph aureus/MRSA]. Nevertheless she once again tells me she wants to wait the left of the MRI. She still makes comments about having her amputation done in [place]. 10/19; arrives today with significant swelling on the lateral right leg. Last culture I did showed Klebsiella. Multidrug- resistant. Cipro was intermediate sensitivity and that is what I have her on pending her MRI which apparently is going to be done on Thursday this week although this seems to be moving back and forth. She is not systemically unwell. We are using silver alginate on her major wound area on the right medial foot and the draining areas on the right lateral lower leg 10/26; MRI showed extensive abscess in the anterior compartment of the right leg also widespread osteomyelitis involving osseous structures of the midfoot and portions of the hindfoot. Also suspicion for osteomyelitis anterior aspect of the distal medial malleolus. Culture I did of the purulence once again showed a multidrug-resistant Klebsiella. I  have been in contact with nephrology late last week and  she has been started on cefepime at dialysis to replace the vancomycin We sent a copy of her MRI report to Dr. Geroge Baseman in [place] who is an orthopedic surgeon. The patient takes great stock in his opinion on this. She says she will go to [place] to have her leg amputated if Dr. Geroge Baseman does not feel there is any salvage options. 11/2; she still is not talk to her orthopedic surgeon in [place]. Apparently he will call her at [phone] this afternoon. The quality of this is she has not allowed me to refer her anywhere. She has been told over and over that she needs this amputated but has not agreed to be referred. She tells me her blood sugar was 600 last night but she has not been febrile. 11/9; she never did got a call from the orthopedic surgeon in [place] therefore that is off the radar. We have arranged to get her see orthopedic surgery at [facility]. She still has a lot of draining purulence coming out of the new abscess in her right leg although that probably came from the osteomyelitis in her right foot and heel. Meanwhile the original wound on the right foot looks very healthy. Continued improvement. The issue is that the last MRI showed osteomyelitis in her right foot extensively she now has an abscess in the right anterior lower leg. There is nobody in [place] who will offer this woman anything but an amputation and to be honest that is probably what she needs. I think she still wants to talk about limb salvage although at this point I just do not see that. She has completed her vancomycin at dialysis which was for the original staph aureus she is still on cefepime for the more recent Klebsiella. She has had a long course of both of these antibiotics which should have benefited the osteomyelitis on the right foot as well as the abscess. Electronic Signature(s) Signed: 02/09/2019 5:25:49 PM By: Linton Ham MD Entered By:  Linton Ham on 02/09/2019 15:34:10 -------------------------------------------------------------------------------- Physical Exam Details Patient Name: Date of Service: [name] 02/09/2019 2:00 PM Medical Record Number:[id number] Patient Account Number: [id number] Date of Birth/Sex: Treating RN: October 18, 1970 (48 y.o. Nancy Fetter Primary Care Provider: [name] Other Clinician: Referring Provider: Treating Provider/Extender:Yona Kosek, [name], [name] in Treatment: 50 Constitutional Patient is hypertensive.. Pulse regular and within target range for patient.Marland Kitchen Respirations regular, non-labored and within target range.. Temperature is normal and within the target range for the patient.Marland Kitchen Appears in no distress. Eyes Conjunctivae clear. No discharge.no icterus. Respiratory work of breathing is normal. Cardiovascular Pedal pulses are palpable on the right. Integumentary (Hair, Skin) Still erythema on the right lateral leg. Psychiatric appears at normal baseline. Notes Wound exam; the original wound on the medial part of her foot looks really quite good. Small amount of necrotic debris in the inferior part. I did not remove this today. She still has the original IandD site purulent drainage coming out of this. It seems a little more localized to around this area but she has been milking it twice a day. Electronic Signature(s) Signed: 02/09/2019 5:25:49 PM By: Linton Ham MD Entered By: Linton Ham on 02/09/2019 15:36:43 -------------------------------------------------------------------------------- Physician Orders Details Patient Name: Date of Service: [name] 02/09/2019 2:00 PM Medical Record Number:[id number] Patient Account Number: [id number] Date of Birth/Sex: Treating RN: 22-Apr-1970 (48 y.o. Nancy Fetter Primary Care Provider: [name] Other Clinician: Referring Provider: Treating Provider/Extender:Jawana Reagor,  [name], [name] in Treatment: 6 Verbal / Phone  Orders: No Diagnosis Coding ICD-10 Coding Code Description M86.671 Other chronic osteomyelitis, right ankle and foot L97.514 Non-pressure chronic ulcer of other part of right foot with necrosis of bone E10.621 Type 1 diabetes mellitus with foot ulcer L02.415 Cutaneous abscess of right lower limb Follow-up Appointments Return Appointment in 1 week. Dressing Change Frequency Wound #43 Right,Medial Foot Change dressing three times week. Wound #44 Right Calcaneus Change dressing three times week. Wound #46 Right,Lateral Lower Leg Change dressing three times week. Wound #47 Right,Anterior Lower Leg Change dressing three times week. Wound Cleansing Clean wound with Wound Cleanser - Anasept to all wounds Primary Wound Dressing Wound #43 Right,Medial Foot Calcium Alginate with Silver Wound #44 Right Calcaneus Calcium Alginate with Silver Wound #46 Right,Lateral Lower Leg Calcium Alginate with Silver - do not pack, place over wound bed Wound #47 Right,Anterior Lower Leg Calcium Alginate with Silver Secondary Dressing Wound #43 Right,Medial Foot Kerlix/Rolled [name] ABD pad Wound #44 Right Calcaneus Kerlix/Rolled [name] Dry [name] Heel Cup Wound #46 Right,Lateral Lower Leg Kerlix/Rolled [name] ABD pad Wound #47 Right,Anterior Lower Leg Kerlix/Rolled [name] ABD pad Edema Control Elevate legs to the level of the heart or above for 30 minutes daily and/or when sitting, a frequency of: - throughout the day [facility] skilled nursing for wound care. - Interim Electronic Signature(s) Signed: 02/09/2019 5:25:49 PM By: Linton Ham MD Signed: 02/09/2019 5:46:53 PM By: [name] RN, BSN Entered By: [name] on 02/09/2019 13:55:44 -------------------------------------------------------------------------------- Problem List Details Patient Name: Date of Service: [name]  02/09/2019 2:00 PM Medical Record Number:[id number] Patient Account Number: [id number] Date of Birth/Sex: Treating RN: 1971/02/03 (48 y.o. Nancy Fetter Primary Care Provider: [name] Other Clinician: Referring Provider: Treating Provider/Extender:Janise Gora, [name], [name] in Treatment: 22 Active Problems ICD-10 Evaluated Encounter Code Description Active Date Today Diagnosis M86.671 Other chronic osteomyelitis, right ankle and foot 09/03/2018 No Yes L97.514 Non-pressure chronic ulcer of other part of right foot 09/03/2018 No Yes with necrosis of bone E10.621 Type 1 diabetes mellitus with foot ulcer 09/24/2018 No Yes L02.415 Cutaneous abscess of right lower limb 12/25/2018 No Yes Inactive Problems Resolved Problems Electronic Signature(s) Signed: 02/09/2019 5:25:49 PM By: Linton Ham MD Entered By: Linton Ham on 02/09/2019 15:31:29 -------------------------------------------------------------------------------- Progress Note Details Patient Name: Date of Service: [name] 02/09/2019 2:00 PM Medical Record Number:[id number] Patient Account Number: [id number] Date of Birth/Sex: Treating RN: 08-03-1970 (48 y.o. Nancy Fetter Primary Care Provider: [name] Other Clinician: Referring Provider: Treating Provider/Extender:Catlin Aycock, [name], [name] in Treatment: 22 Subjective History of Present Illness (HPI) 48 year old diabetic who is known to have type 1 diabetes which is poorly controlled last hemoglobin A1c was 11%. She comes in with a ulcerated area on the left lateral foot which has been there for over 6 months. Was recently she has been treated by [name] of podiatry who saw her last on 05/28/2016. Review of his notes revealed that the patient had incision and drainage with placement of antibiotic beads to the left foot on 04/11/2016 for possible osteomyelitis of the cuboid bone. Over the last year she's had a history of  amputation of the left fifth toe and a femoropopliteal popliteal bypass graft somewhere in April 2017. 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 His note [name] mentions that the patient has been referred to me for further wound care and possibly great candidate for hyperbaric oxygen therapy due to recurrent osteomyelitis. However we do not have any x-rays of  biopsy reports confirming this. He has been on several antibiotics including Bactrim and most recently is on doxycycline for an MRSA. I understand, the patient was not a candidate for IV antibiotics as she has had previous PICC lines which resulted in blood clots in both arms. There was a x-ray report dated 04/04/2016 on [name] notes which showed evidence of fifth ray resection left foot with osteolytic changes noted to the fourth metatarsal and cuboid bone on the left. 06/13/2016 -- had a left foot x-ray which showed no acute fracture or dislocation and no definite radiographic evidence of osteomyelitis. Advanced osteopenia was seen. 06/20/2016 -- she has noticed a new wound on the right plantar foot in the region where she had a callus before. 06/27/16- the patient did have her x-ray of the right foot which showed no findings to suggest osteomyelitis. She saw her endocrinologist, [name], yesterday. Her A1c in January was 11. He also indicates mismanagement and noncompliance regarding her diabetes. She is currently on Bactrim for a lip infection. She is complaining of nausea, vomiting and diarrhea. She is unable to articulate the exact orders or dosing of the Bactrim; it is unclear when she will complete this. 07/04/2016 -- results from [facility] of ABIs with ankle waveforms were noted from 02/14/2016. The examination done on 06/27/2015 showed noncompressible ABIs with the right being 1.45 and the left being 1.33. The present examination showed a right ABI of 1.19 on the left of 1.33. The conclusion was that right  normal ABI in the lower extremity at rest however compared to previous study which was noncompressible ABI may be falsely elevated side suggesting medial calcification. The left ABI suggested medial calcification. 08/01/2016 -- the patient had more redness and pain on her right foot and did not get to come to see as noted she see her PCP or go to the ER and decided to take some leftover metronidazole which she had at home. As usual, the patient does report she feels and is rather noncompliant. 08/08/2016 -- -- x-ray of the right foot -- FINDINGS:Transmetatarsal amputation is noted. No bony destruction is noted to suggest osteomyelitis. IMPRESSION: No evidence of osteomyelitis. Postsurgical changes are seen. MRI would be more sensitive for possible bony changes. Culture has grown Serratia Marcescens -- sensitive to Bactrim, ciprofloxacin, ceftazidime she was seen by [name] on 08/06/2016. He did not find any exposed bone, muscle, tendon, ligament or joint. There was no malodor and he did a excisional debridement in the office. ============ Old notes: 48 year old patient who is known to the wound clinic for a while had been away from the wound clinic since 09/01/2014. Over the last several months she has been admitted to various hospitals including [facility]. She was treated for a right metatarsal osteomyelitis with a transmetatarsal amputation and this was done about 2 months ago. He has a small ulcerated area on the right heel and she continues to have an ulcerated area on the left plantar aspect of the foot. The patient was recently admitted to the [facility] hospital group between 7/12 and 10/18/2014. she was given 3 weeks of IV vancomycin and was to follow-up with her surgeons at [facility] and also took oral vancomycin for C. difficile colitis. Past medical history is significant for type 1 diabetes mellitus with neurological manifestations and  uncontrolled cellulitis, DVT of the left lower extremity, C. 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 MRI of the left foot done on 10/14/2014 showed no abscess or osteomyelitis. 04/27/15; this is a patient we know from previous stays in the wound care center. She is a type I diabetic I am not sure of her control currently. Since the last time I saw her she is had a right transmetatarsal amputation and has no wounds on her right foot and has no open wounds. She is been followed at the wound care center at [facility] in [place]. She comes today with the desire to undergo hyperbaric treatment locally. Apparently one of her wound care providers in [place] has suggested hyperbarics. This is in response to an MRI from 04/18/15 that showed increased marrow signal and loss of the proximal fifth metatarsal cortex evidence of osteomyelitis with likely early osteomyelitis in the cuboid bone as well. She has a large wound over the base of the fifth metatarsal. She also has a eschar over her the tips of her toes on 1,3 and 5. She does not have peripheral pulses and apparently is going for an angiogram tomorrow which seems reasonable. After this she is going to infectious disease at [facility]. They have been using Medihoney to the large wound on the lateral aspect of the left foot to. The patient has known Charcot deformity from diabetic neuropathy. She also has known diabetic PAD. Surprisingly I can't see that she has had any recent antibiotics, the patient states the last antibiotic she had was at the end of November for 10 days. I think this was in response to culture that showed group G strep although I'm not exactly sure where the culture was from. She is also had arterial studies on 03/29/15. This showed a right ABI of 1.4 that was noncompressible. Her left ABI was 0.73.  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 It was not felt that arterial inflow was adequate for healing of a foot ulcer. Her Doppler waveforms looked monophasic ===== READMISSION 02/28/17; this is in an now 48 year old woman we've had at several different occasions in this clinic. 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 She has a remote ex-smoker. She was last seen in this clinic by Dr. Con Memos I think in May. More recently she is been followed by her podiatrist [name] an infectious disease Dr. Megan Salon. She has 2 open wounds the major one is over the right first metatarsal head she also has a wound on the left plantar foot. an MRI of the right foot on 01/01/17 showed a soft tissue ulcer along the plantar aspect of the first metatarsal base consistent with osteomyelitis of the first metatarsal stump. Dr. 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 According to the patient this is been open for slightly over a month. She has been on a combination of Cipro 500 twice a day, Zyvox 600 twice a day and Flagyl 500 3 times a day for over a month now as directed by Dr. Megan Salon. cultures of the right foot earlier this year showed MRSA in January and Serratia in May. January also had a few viridans strep. Recent x-rays of both feet were done and [name] office and I don't have these reports. The patient has known PAD and has a history of aleft femoropopliteal bypass in April 2017. She underwent a right TMA in June 2016 and a left fifth ray amputation in April 2017 the patient has an  insulin pump and she works closely with her endocrinologist Dr. Dwyane Dee. In spite of this the last hemoglobin A1c I can see is 10.1 on 01/01/2017. She is being referred by [name] for consideration of hyperbaric oxygen for chronic refractory osteomyelitis involving the right first metatarsal  head with a Wagner 3 wound over this area. She is been using Medihoney to this area and also an area on the left midfoot. She is using healing sandals bilaterally. ABIs in this clinic at the left posterior tibial was 1.1 noncompressible on the right READMISSION Non invasive vascular NOVANT 5/18 Aftercare following surgery of the circulatory system Procedure Note - Interface, External Ris In - 08/13/2016 11:05 AM EDT Procedure: Examination consists of physiologic resting arterial pressures of the brachial and ankle arteries bilaterally with continuous wave Doppler waveform analysis. Previous: Previous exam performed on 02/14/16 demonstrated ABIs of Rt = 1.19 and Lt = 1.33. Right: ABI = non-compressible PT, 1.47 DP. S/P transmet amputation. Left: ABI = 1.52, 2nd digit pressure = 87 mmHg Conclusions: Right: ABI (>1.3) may be falsely elevated, suggesting medial calcification. Left: ABI (>1.3) may be falsely elevated, suggesting medial calcification The patient is a now 48 year old type I diabetic is had multiple issues her graded to chronic diabetic foot ulcers. 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 We had her in the clinic lastin November. At that point she had wounds on her bilateral feet.she had wanted to try hyperbarics however the healogics review process denied her because she hadn't followed up with her vascular surgeon for her left femoropopliteal bypass. The bypass was done by Dr. Raul Del at [facility]. We made her a follow-up with Dr. Raul Del however she did not keep the appointment and therefore she was not approved The patient shows me a small wound on her left fourth metatarsal head on her phone. She developed rapid discoloration in the plantar aspect of the left foot and she was admitted to hospital from 2/2 through 05/10/17 with wet gangrene of the left foot osteomyelitis of the fourth metatarsal  heads. She was admitted acutely ill with a temperature of 103. She was started on broad-spectrum vancomycin and cefepime. On 05/06/17 she was taken to the OR by [name] her podiatric surgeon for an incision and drainage irrigation of the left foot wound. Cultures from this surgery revealed group be strep and anaerobes. she was seen by Dr.Xu of orthopedic surgery and scheduled for a below-knee amputation which she u refused. Ultimately she was discharged on Levaquin and Flagyl for one month. MRI 05/05/17 done while she was in the hospital showed abscess adjacent to the fourth metatarsal head and neck small abscess around the fourth flexor tendon. Inflammatory phlegmon and gas in the soft tissues along the lateral aspect of the fourth phalanx. Findings worrisome for osteomyelitis involving the fourth proximal and middle phalanx and also the third and fourth metatarsals. Finally the patient had actually shortly before this followed up with Dr. Raul Del at no time on 04/29/17. He felt that her left femoropopliteal bypass was patent he felt that her left-sided toe pressures more than adequate for healing a wound on the left foot. This was before her acute presentation. 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 05/28/17; she is started hyperbarics. The patient tells me that for some reason she was not actually on Levaquin but I think on ciprofloxacin. She was on Flagyl. She only started her Levaquin yesterday due to some difficulty with the pharmacy  and perhaps her sister picking it up. She has an appointment with [name] tomorrow and with infectious disease early next week. She has no new complaints 06/06/17; the patient continues in hyperbarics. She saw [name] on 05/29/17 who is her podiatric surgeon. He is elected for a transmetatarsal amputation on 06/27/17. I'm not sure at what level he plans to do this amputation. The patient is unaware ooShe also saw Dr. Megan Salon of infectious disease who elected  to continue her on current antibiotics I think this is ciprofloxacin and Flagyl. I'll need to clarify with her tomorrow if she actually has this. We're using silver alginate to the actual wound. Necrotic surface today with material under the flap of her foot. ooOriginal MRI showed abscesses as well as osteomyelitis of the proximal and middle fourth phalanx and the third and fourth metatarsal heads 06/11/17; patient continues in hyperbarics and continues on oral antibiotics. She is doing well. The wound looks better. The necrotic part of this under the flap in her superior foot also looks better. she is been to see [name]. I haven't had a chance to look at his note. Apparently he has put the transmetatarsal amputation on hold her request it is still planning to take her to the OR for debridement and product application ACEL. I'll see if I can find his note. I'll therefore leave product ordering/requests to [name] for now. I was going to look at Dermagraft 06/18/17-she is here in follow-up evaluation for bilateral foot wounds. She continues with hyperbaric therapy. She states she has been applying manuka honey to the right plantar foot and alternate manuka honey and silver alginate to the left foot, despite our orders. We will continue with same treatment plan and she will follo up next week. 06/25/17; I have reviewed [name] last note from 3/11. She has operative debridement in 2 days' time. By review his note apparently they're going to place there is skin over the majority of this wound which is a good choice. She has a small satellite area at the most proximal part of this wound on the left plantar foot. The area on the right plantar foot we've been using silver alginate and it is close to healing. 07/02/17; unfortunately the patient was not easily approved for [name] proposed surgery. I'm not completely certain what the issue is. She has been using silver alginate to the wound she has  completed a first course of hyperbarics. She is still on Levaquin and Flagyl. I have really lost track of the time course here.I suspect she should have another week to 2 of antibiotics. I'll need to see if she is followed up with infectious disease Dr. Megan Salon 07/09/17; the patient is followed up with Dr. Megan Salon. 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 She continues on Levaquin and metronidazole continuing both of these for now I think she is been on fr about 6 weeks. She still has some drainage but no pain. No fever. Her had been plans for her to go to the OR for operative debridement with her podiatrist [name], I am not exactly sure where that is. I'll probably slip a note to [name] today. I note that she follows with Dr. Dwyane Dee of endocrinology. We have her recertified for hyperbaric oxygen. I have not heard about Dermagraft however I'll see if [name] is planning a skin substitute as well 07/16/17; the patient tells me she is just about  out of Levaquin. I'll need to check Dr. Hale Bogus last notes on this. She states she has plenty of Flagyl however. She comes in today complaining of pain in the right lateral foot which she said lasted for about a day. The wound on the right foot is actually much more medially. She also tells me that the [facility] cost a lot of pain in the left foot wound and she turned back to silver alginate. Finally Dermagraft has a $761 per application co-pay. She cannot afford this 07/23/17; patient arrives today with the wound not much smaller. There is not much new to add. She has not heard from [name] all try to put in a call to them today. She was asking about Dermagraft again and she has an over $607 per application co-pay she states that she would be willing to try to do a payment plan. I been tried to avoid this. We've been using silver alginate, I'll change to [facility] 07/30/17-She is here in  follow-up evaluation for left foot ulcer. She continues hyperbaric medicine. The left foot ulcer is stable we will continue with same treatment plan 08/06/17; she is here for evaluation of her left foot ulcer. Currently being treated for hyperbarics or underlying osteomyelitis. She is completed antibiotics. The left foot ulcer is better smaller with healthier looking granulation. For various reasons I am not really clear on we never got her back to the OR with [name]. He did not respond to my secure text message. Nevertheless I think that surgery on this point is not necessary nor am I completely clear that a skin substitute is necessary The patient is complaining about pain on the outside of her right foot. 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 There is no erythema. She also states the foot is warm versus her other part of her upper leg and this is largely true. It is not totally clear to me what's causing this. She thinks it's different from her usual neuropathy pain 08/13/17; she arrives in clinic today with a small wound which is superficial on her right first metatarsal head. 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 She tells [place] she was up on her feet over the Mother's Day celebration. ooThe large wound is on the left foot. Continues with hyperbarics for underlying osteomyelitis. We're using Hydrofera Blue. She asked me today about where we were with Dermagraft. I had actually excluded this because of the co- pay however she wants to assume this therefore I'll recheck the co-pay an order for next week. 08/20/17; the patient agreed to accept the co-pay of the first Apligraf which we applied today. She is disappointed she is finishing hyperbarics will run this through the insurance on the extent of the foot infection and the extent of the wound that she had however she is already had 60 dive's. Dermagraft No. 1 08/27/17; Dermagraft No. 2. She is not  eligible for any more hyperbaric treatments this month. She reports a fair amount of drainage and she actually changed to the external dressings without disturbing the direct contact layer 09/03/17; the patient arrived in clinic today with the wound superficially looking quite healthy. Nice vibrant red tissue with some advancing epithelialization although not as much adherence of the flap as I might like. However she noted on her own fourth toe some bogginess and she brought that to our attention. Indeed this was boggy feeling like a possibility of subcutaneous fluid. She stated that this was similar  to how an issue came up on the lateral foot that led to her fifth ray amputation. She is not been unwell. We've been using Dermagraft 09/10/17; the culture that I did not last week was MRSA. She saw Dr. Megan Salon this morning who is going to start her on vancomycin. I had sent him a secure a text message yesterday. I also spoke with her podiatric surgeon [name] about surgery on this foot the options for conserving a functional foot etc. Promised me he would see her and will make back consultation today. Paradoxically her actual wound on the plantar aspect of her left foot looks really quite good. I had given her 5 days worth of Baxdella to cover her for MRSA. Her MRI came back showing osteomyelitis within the third metatarsal shaft and head and base of the third and fourth proximal phalanx. She had extensive inflammatory changes throughout the soft tissue of the lateral forefoot. With an ill-defined fluid around the fourth metatarsal extending into the plantar and dorsal soft tissues 09/19/17; the patient is actually on oral Septra and Flagyl. She apparently refused IV vancomycin. She also saw [name] at my request who is planning her for a left BKA sometime in mid July. MRI showed osteomyelitis within the third metatarsal shaft and head and the basis of the third and fourth proximal phalanx. I believe  there was felt to be possible septic arthritis involving the third MTP. 09/26/17; the patient went back to Dr. Megan Salon at my suggestion and is now receiving IV daptomycin. Her wound continues to look quite good making the decision to proceed with a transmetatarsal amputation although more difficult for the patient. I believe in my extensive discussions with her she has a good sense of the pros and cons of this. I don't NV the tuft decision she has to make. She has an appointment with [name] I believe in mid July and I previously spoken to him about this issue Has we had used 3 previous Dermagraft. Given the condition of the wound surface I went ahead and added the fourth one today area and I did this not fully realizing that she'll be traveling to [place] next week. I'm hopeful she can come back in 2 weeks 10/21/17; Her same Dermagraft on for about 3-1/2 weeks. In spite of this the wound arrives looking quite healthy. There is been a lot of healing dimensions are smaller. Looking at the square shaped wound she has now there is some undermining and some depth medially under the undermining although I cannot palpate any bone. No surrounding infection is obvious. She has difficult questions about how to look at this going forward vis--vis amputations versus continued medical therapy. To be truthful the wound is looks so healthy and it is continued to contract. Hard to justify foot surgery at this point although I still told her that I think it might come to that if we are not able to eradicate the underlying MRSA. She is still highly at risk and she understands this 11/06/17 on evaluation today patient appears to be doing better in regard to her foot ulcer. She's been tolerating the dressing changes without complication. Currently she is here for her Dermagraft #6. Her wound continues to make excellent progress at this point. She does not appear to have any evidence of infection which is good  news. 11/13/17 on evaluation today patient appears to be doing excellent at this time. She is here for repeat Dermagraft application. This is #7. Overall her  wound seems to be making great progress. 12/05/17; the patient arrives with the wound in much better condition than when I last saw this almost 6 weeks ago. She still has a small probing area in the left metatarsal head region on the lateral aspect of her foot. We applied her last Dermagraft today. ooSince the last time she is here she has what appears to a been a blood blister on the plantar aspect of left foot although I don't see this is threatening. There is also a thick raised tissue on the right mid metatarsal head region. This was not there I don't think the last time she was here 3 weeks ago. 12/12/17; the patient continues to have a small programming area in the left metatarsal head region on the lateral aspect of her foot which was the initial large surgical wound. I applied her last Apligraf last week. I'm going to use Endoform starting today ooUnfortunately she has an excoriated area in the left mid foot and the right mid foot. The left midfoot looks like a blistered area this was not opened last week it certainly is open today. Using silver alginate on these areas. She promises me she is offloading this. 12/19/17; the small probing area in the left metatarsal head eyes think is shallower. In general her original wound looks better. We've been using Endoform. The area inferiorly that I think was trauma last week still requires debridement a lot of nonviable surface which I removed. She still has an open open area distally in her foot ooSimilarly on the right foot there is tightly adherent surface debris which I removed. Still areas that don't look completely epithelialized. This is a small open area. We used silver alginate on these areas 12/26/2017; the patient did not have the supplies we ordered from last week including the  Endoform. The original large wound on the left lateral foot looks healthy. She still has the undermining area that is largely unchanged from last week. She has the same heavily callused raised edged wounds on the right mid and left midfoot. Both of these requiring debridement. We have been using silver alginate on these areas 01/02/2018; there is still supply issues. We are going to try to use Prisma but I am not sure she actually got it from what she is saying. She has a new open area on the lateral aspect of the left fourth toe [previous fifth ray amputation]. Still the one tunneling area over the fourth metatarsal head. The area is in the midfoot bilaterally still have thick callus around them. She is concerned about a raised swelling on the lateral aspect of the foot. However she is completely insensate 01/10/2018; we are using Prisma to the wounds on her bilateral feet. Surprisingly the tunneling area over the left fourth metatarsal head that was part of her original surgery has closed down. She has a small open area remaining on the incision line. 2 open areas in the midfoot. 02/10/2018; the patient arrives back in clinic after a month hiatus. She was traveling to visit family in [place]. Is fairly clear she was not offloading the areas on her feet. The original wound over the left lateral foot at the level of metatarsal heads is reopened and probes medially by about a centimeter or 2. She notes that a week ago she had purulent drainage come out of an area on the left midfoot. Paradoxically the worst area is actually on the right foot is extensive with purulent drainage. We will use  silver alginate today 02/17/2018; the patient has 3 wounds one over the left lateral foot. She still has a small area over the metatarsal heads which is the remnant of her original surgical wound. This has medial probing depth of roughly 1.4 cm somewhat better than last week. The area on the right foot is larger.  We have been using silver alginate to all areas. The area on the right foot and left foot that we cultured last week showed both Klebsiella and Proteus. Both of these are quinolone sensitive. The patient put her's self on Bactrim and Flagyl that she had left hanging around from prior antibiotic usages. She was apparently on this last week when she arrived. I did not realize this. Unfortunately the Bactrim will not cover either 1 of these organisms. We will send in Cipro 500 twice daily for a week 03/04/2018; the patient has 2 wounds on the left foot one is the original wound which was a surgical wound for a deep DFU. At one point this had exposed bone. She still has an area over the fourth metatarsal head that probes about 1.4 cm although I think this is better than last week. I been using silver nitrate to try and promote tissue adherence and been using silver alginate here. ooShe also has an area in the left midfoot. This has some depth but a small linear wound. Still requiring debridement. ooOn the right midfoot is a circular wound. A lot of thick callus around this area. ooWe have been using silver alginate to all wound areas ooShe is completed the ciprofloxacin I gave her 2 weeks ago. 03/11/2018; the patient continues to have 2 open areas on the left foot 1 of which was the original surgical wound for a deep DFU. Only a small probing area remains although this is not much different from last week we have been using silver alginate. The other area is on the midfoot this is smaller linear but still with some depth. We have been using silver alginate here as well ooOn the right foot she has a small circular wound in the mid aspect. This is not much smaller than last time. We have been using silver alginate here as well 03/18/2018; she has 3 wounds on the left foot the original surgical wound, a very superficial wound in the mid aspect and then finally the area in the mid plantar foot. She  arrives in today with a very concerning area in the wound in the mid plantar foot which is her most proximal wound. There is undermining here of roughly 1-1/2 cm superiorly. Serosanguineous drainage. She tells me she had some pain on for over the weekend that shot up her foot into her thigh and she tells me that she had a nodule in the groin area. ooShe has the single wound in the right foot. ooWe are using endoform to both wound areas 03/24/2018; the patient arrives with the original surgical wound in the area on the left midfoot about the same as last week. There is a collection of fluid under the surface of the skin extending from the surgical wound towards the midfoot although it does not reach the midfoot wound. The area on the right foot is about the same. Cultures from last week of the left midfoot wound showed abundant Klebsiella abundant Enterococcus faecalis and moderate methicillin resistant staph I gave her Levaquin but this would have only covered the Klebsiella. She will need linezolid 04/01/2018; she is taking linezolid but for the first  few days only took 1 a day. I have advised her to finish this at twice daily dosing. In any case all of her wounds are a lot better especially on the left foot. The original surgical wound is closed. The area on the left midfoot considerably smaller. The area on the right foot also smaller. 04/08/2018; her original surgical wound/osteomyelitis on the left foot remains closed. She has area on the left foot that is in the midfoot area but she had some streaking towards this. This is not connected with her original wound at least not visually. ooSmall wound on the right midfoot appears somewhat smaller. 04/15/18; both wounds looks better. Original wound is better left midfoot. Using silver alginate 1/21; patient states she uses saltwater soak in, stones or remove callus from around her wounds. She is also concerned about a blood blister she had on  the left foot but it simply resolved on its own. We've been using silver alginate 1/28; the patient arrives today with the same streaking area from her metatarsals laterally [the site of her original surgical wound] down to the middle of her foot. There is some drainage in the subcutaneous area here. This concerns me that there is actually continued ongoing infection in the metatarsals probably the fourth and third. This fixates an MRI of the foot without contrast [chronic renal failure] ooThe wound in the mid part of the foot is small but I wonder whether this area actually connects with the more distal foot. ooThe area on the right midfoot is probably about the same. Callus thick skin around the small wound which I removed with a curette we have been using silver alginate on both wound areas 2/4; culture I did of the draining site on the left foot last time grew methicillin sensitive staph aureus. MRI of the left foot showed interval resolution of the findings surrounding the third metatarsal joint on the prior study consistent with treated osteomyelitis. Chronic soft tissue ulceration in the plantar and lateral aspect of the forefoot without residual focal fluid collection. No evidence of recurrent osteomyelitis. 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 She tells me she had nausea and vomiting over the weekend and missed 2 days. In general her foot looks much the same. She has a small open area just below the left fourth metatarsal head. A linear area in the left midfoot. Some discoloration extending from the inferior part of this into the left lateral foot although this appears to be superficial. She has a small area on the right midfoot which  generally looks smaller after debridement 2/18; the patient is completing his cephalexin and has another 2 days. She continues to have open areas on the left and right foot. 2/25; she is now off antibiotics. The area on the left foot at the site of her original surgical wound has closed yet again. She still has open areas in the mid part of her foot however these appear smaller. The area on the right mid foot looks about the same. We have been using silver alginate She tells me she had a serious hypoglycemic spell at home. She had to have EMS called and get IV dextrose 3/3; disappointing on the left lateral foot large area of necrotic tissue surrounding the  linear area. This appears to track up towards the same original surgical wound. Required extensive debridement. The area on the right plantar foot is not a lot better also using silver 3/12; the culture I did last time showed abundant enterococcus. I have prescribed Augmentin, should cover any unrecognized anaerobes as well. In addition there were a few MRSA and Serratia that would not be well covered although I did not want to give her multiple antibiotics. She comes in today with a new wound in the right midfoot this is not connected with the original wound over her MTP a lot of thick callus tissue around both wounds but once again she said she is not walking on these areas 3/17-Patient comes in for follow-up on the bilateral plantar wounds, the right midfoot and the left plantar wound. Both these are heavily callused surrounding the wounds. We are continuing to use silver alginate, she is compliant with offloading and states she uses a wheelchair fairly often at home 3/24; both wound areas have thick callus. However things actually look quite a bit better here for the majority of her left foot and the right foot. 3/31; patient continues to have thick callused somewhat irritated looking tissue around the wounds which individually are fairly  superficial. There is no evidence of surrounding infection. We have been using silver alginate however I change that to [facility] today 4/17; patient returns to clinic after having a scare with Covid she tested negative in her primary doctor's office. She has been using Hydrofera Blue. She does not have an open area on the right foot. On the left foot she has a small open area with the mid area not completely viable. She showed me pictures of what looks like a hemorrhagic blister from several days ago but that seems to have healed over this was on the lateral left foot 4/21; patient comes in to clinic with both her wounds on her feet closed. However over the weekend she started having pain in her right foot and leg up into the thigh. She felt as though she was running a low-grade fever but did not take her temperature. She took a doxycycline that she had leftover and yesterday a single Septra and metronidazole. She thinks things feel somewhat better. 4/28; duplex ultrasound I ordered last week was negative for DVT or superficial thrombophlebitis. She is completed the doxycycline I gave her. States she is still having a lot of pain in the right calf and right ankle which is no better than last week. She cannot sleep. She also states she has a temperature of up to 101, coughing and complaining of visual loss in her bilateral eyes. Apparently she was tested for Covid 2 weeks ago at [facility] and that was negative. Readmission: 09/03/18 patient presents back for reevaluation after having been evaluated at the end of April regarding erythema and swelling of her right lower extremity. Subsequently she ended up going to the hospital on 07/29/18 and was admitted not to be discharged until 08/08/18. Unfortunately it was noted during the time that she was in the hospital that she did have methicillin-resistant Staphylococcus aureus as the infection noted at the site. It was also determined that she did have  osteomyelitis which appears to be fairly significant. She was treated with vancomycin and in fact is still on IV vancomycin at dialysis currently. This is actually slated to continue until 09/12/18 at least which will be the completion of the six weeks of therapy. Nonetheless based on what  I'm seeing at this point I'm not sure she will be anywhere near ready to discontinue antibiotics at that time. Since she was released from the hospital she was seen by [name] who is her podiatrist on 08/27/18. His note specifically states that he is recommended that the patient needs of one knee amputation on the right as she has a life-threatening situation that can lead quickly to sepsis. The patient advised she would like to try to save her leg to which [name] apparently told her that this was against all medical advice. She also want to discontinue the Wound VAC which had been initiated due to the fact that she wasn't pleased with how the wound was looking and subsequently she wanted to pursue applying Medihoney at that time. He stated that he did not believe that the right lower extremity was salvageable and that the patient understood but would still like to attempt hyperbaric option therapy if it could be of any benefit. She was therefore referred back to [place] for further evaluation. He plans to see her back next week. Upon inspection today patient has a significant amount purulent drainage noted from the wound at this point. The bone in the distal portion of her foot also appears to be extremely necrotic and spongy. When I push down on the bone it bubbles and seeps purulent drainage from deeper in the end of the foot. I do not think that this is likely going to heal very well at all and less aggressive surgical debridement were undertaken more than what I believe we can likely do here in our office. 09/12/2018; I have not seen this patient since the most recent hospitalization although she was in our  clinic last week. I have reviewed some of her records from a complex hospitalization. 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 There is situation was complicated by MRSA bacteremia and acute on chronic renal failure now on dialysis. She is receiving vancomycin at dialysis. We started her on Dakin's wet-to- dry last week she is changing this daily. There is still purulent drainage coming out of her foot. Although she is apparently "agreeable" to a below-knee amputation which is been suggested by multiple clinicians she wants this to be done in [place]. She apparently has a telehealth visit with that provider sometime in late [place] 6/24. I have told her I think this is probably too long. Nevertheless I could not convince her to allow a local doctor to perform BKA. 09/19/2018; the patient has a large necrotic area on the right anterior foot. She has had previous transmetatarsal amputations. Culture I did last week showed MRSA nothing else she is on vancomycin at dialysis. She has continued leaking purulent drainage out of the distal part of the large circular wound on the right anterior foot. She apparently went to see Dr. 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 Somehow that translated into her being referred to plastic surgery for debridement of the area. I gather she basically refused amputation although I do not have a copy of Dr. Berenice Primas notes. The patient really wants to have a trial of hyperbaric oxygen. I agreed with initial assessment in this clinic that this was probably too far along to benefit however if she is going to have plastic surgery I think she would benefit from ancillary hyperbaric oxygen. The issue here is that the patient has benefited as maximally as any patient I have ever seen  from hyperbaric oxygen therapy. Most recently she had exposed bone on the lateral part of her left foot after a  surgical procedure and that actually has closed. She has eschared areas in both heels but no open area. She is remained systemically well. I am not optimistic that anything can be done about this but the patient is very clear that she wants an attempt. The attempt would include a wound VAC further debridements and hyperbaric oxygen along with IV antibiotics. 6/26; I put her in for a trial of hyperbaric oxygen only because of the dramatic response she has had with wounds on her left midfoot earlier this year which was a surgical wound that went straight to her bone over the metatarsal heads and also remotely the left third toe. We will see if we can get this through our review process and insurance. She arrives in clinic with again purulent material pouring out of necrotic bone on the top of the foot distally. There is also some concerning erythema on the front of the leg that we marked. It is bit difficult to tell how tender this is because of neuropathy. I note from infectious disease that she had her vancomycin extended. 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 She had the wound VAC on for part of the week. The rest of the time she is putting various things on this including Medihoney, "ionized water" silver sorb gel etc. 7/7; follow-up along with HBO. She is still on vancomycin at dialysis. She has a large open area on the dorsal right foot and a small dark eschar area on her heel. There is a lot less erythema in the area and a lot less tenderness. From an infection point of view I think this is better. She still has a lot of necrosis in the remaining right forefoot [previous TMA] we are still using the wound VAC in this area 7/16; follow-up along with HBO. I put her on linezolid after she finished her vancomycin. We started this last Friday I gave her 2 weeks worth. I had the expectation that she would be operatively debrided by Dr. Marla Roe but that still has  not happened yet. Patient phoned the office this week. She arrives for review today after HBO. The distal part of this wound is completely necrotic. Nonviable pieces of tendon bone was still purulent drainage. Also concerning that she has black eschar over the heel that is expanding. I think this may be indicative of infection in this area as well. She has less erythema and warmth in the ankle and calf but still an abnormal exam 7/21 follow-up along with HBO. I will renew her linezolid after checking a CBC with differential monitoring her blood counts especially her platelets. She was supposed to have surgery yesterday but if I am reading things correctly this was canceled after her blood sugar was found to be over 500. I thought Dr. Marla Roe who called me said that they were sending her to the ER but the patient states that was not the case. 7/28. Follow-up along with HBO. She is on linezolid I still do not have any lab work from dialysis even though I called last week. The patient is concerned about an area on her left lateral foot about the level of the base of her fifth metatarsal. I did not really see anything that ominous here however this patient is in [place] ability to point out problems that she is sensing and she has been  accurate in the past Finally she received a call from Dr. Marla Roe who is referring her to another orthopedic surgeon stating that she is too booked up to take her to the operating room now. Was still using a wound VAC on the foot 8/3 -Follow-up after HBO, she is got another week of linezolid, she is to call ID for an appointment, x-rays of both feet were reviewed, the left foot x-ray with third MTP joint osteo- Right foot x-ray widespread osteo-in the right midfoot Right ankle x-ray does not show any active evidence of infection 8/11-Patient is seen after HBO, the wounds on the right foot appear to be about the same, the heel wound had some necrotic base over  tendon that was debrided with a curette 8/21; patient is seen after HBO. The patient's wound on her dorsal foot actually looks reasonably good and there is substantial amount of epithelialization however the open area distally still has a lot of necrotic debris partially bone. I cannot really get a good sense of just how deep this probes under the foot. She has been pressuring me this week to order medical maggots through a company in [place] for her. The problem I have is there is not a defined wound area here. On the positive side there is no purulence. She has been to see infectious disease she is still on Septra DS although I have not had a chance to review their notes 8/28; patient is seen in conjunction with HBO. The wounds on her foot continued to improve including the right dorsal foot substantially the, the distal part of this wound and the area on the right heel. We have been using a wound VAC over this chronically. She is still on trimethoprim as directed by infectious disease 9/4; patient is seen in conjunction with HBO. Right dorsal foot wound substantially anteriorly is better however she continues to have a deep wound in the distal part of this that is not responding. We have been using silver collagen under border foam ooArea on the right plantar medial heel seems better. We have been using Hydrofera Blue 12/12/18 on evaluation today patient appears to be doing about the same with regard to her wound based on prior measurements. She does have some necrotic tissue noted on the lateral aspect of the wound that is going require a little bit of sharp debridement today. This includes what appears to be potentially either severely necrotic bone or tendon. Nonetheless other than that she does not appear to have any severe infection which is good news 9/18; it is been 2 weeks since I saw this wound. She is tolerating HBO well. Continued dramatic improvement in the area on the right  dorsal foot. She still has a small wound on the heel that we have been using Hydrofera Blue. She continues with a wound VAC 9/24; patient has to be seen emergently today with a swelling on her right lateral lower leg. She says that she told Dr. Evette Doffing about this and also myself on a couple of occasions but I really have no recollection of this. She is not systemically unwell and her wound really looked good the last time I saw this. She showed this to providers at dialysis and she was able to verify that she was started on cephalexin today for 5 doses at dialysis. She dialyzes on Tuesday Thursday and Saturday. 10/2; patient is seen in conjunction with HBO. The area that is draining on the right anterior medial tibia is more extensive.  Copious amounts of serosanguineous drainage with some purulence. We are still using the wound VAC on the original wound then it is stable. Culture I did of the original IandD showed MRSA I contacted dialysis she is now on vancomycin with dialysis treatments. I asked them to run a month 10/9; patient seen in conjunction with HBO. She had a new spontaneous open area just above the wound on the right medial tibia ankle. More swelling on the right medial tibia. Her wound on the foot looks about the same perhaps slightly better. There is no warmth spreading up her leg but no obvious erythema. her MRI of the foot and ankle and distal tib-fib is not booked for next Friday I discussed this with her in great detail over multiple days. it is likely she has spreading infection upper leg at least involving the distal 25% above the ankle. She knows that if I refer her to orthopedics for infectious disease they are going to recommend amputation and indeed I am not against this myself. We had a good trial at trying to heal the foot which is what she wanted along with antibiotics debridement and HBO however she clearly has spreading infection [probably staph aureus/MRSA].  Nevertheless she once again tells me she wants to wait the left of the MRI. She still makes comments about having her amputation done in [place]. 10/19; arrives today with significant swelling on the lateral right leg. Last culture I did showed Klebsiella. Multidrug- resistant. Cipro was intermediate sensitivity and that is what I have her on pending her MRI which apparently is going to be done on Thursday this week although this seems to be moving back and forth. She is not systemically unwell. We are using silver alginate on her major wound area on the right medial foot and the draining areas on the right lateral lower leg 10/26; MRI showed extensive abscess in the anterior compartment of the right leg also widespread osteomyelitis involving osseous structures of the midfoot and portions of the hindfoot. Also suspicion for osteomyelitis anterior aspect of the distal medial malleolus. Culture I did of the purulence once again showed a multidrug-resistant Klebsiella. I have been in contact with nephrology late last week and she has been started on cefepime at dialysis to replace the vancomycin We sent a copy of her MRI report to Dr. Geroge Baseman in [place] who is an orthopedic surgeon. The patient takes great stock in his opinion on this. She says she will go to [place] to have her leg amputated if Dr. Geroge Baseman does not feel there is any salvage options. 11/2; she still is not talk to her orthopedic surgeon in [place]. Apparently he will call her at [phone] this afternoon. The quality of this is she has not allowed me to refer her anywhere. She has been told over and over that she needs this amputated but has not agreed to be referred. She tells me her blood sugar was 600 last night but she has not been febrile. 11/9; she never did got a call from the orthopedic surgeon in [place] therefore that is off the radar. We have arranged to get her see orthopedic surgery at [facility]. She still  has a lot of draining purulence coming out of the new abscess in her right leg although that probably came from the osteomyelitis in her right foot and heel. Meanwhile the original wound on the right foot looks very healthy. Continued improvement. The issue is that the last MRI showed osteomyelitis in her right foot  extensively she now has an abscess in the right anterior lower leg. There is nobody in [place] who will offer this woman anything but an amputation and to be honest that is probably what she needs. I think she still wants to talk about limb salvage although at this point I just do not see that. She has completed her vancomycin at dialysis which was for the original staph aureus she is still on cefepime for the more recent Klebsiella. She has had a long course of both of these antibiotics which should have benefited the osteomyelitis on the right foot as well as the abscess. Objective Constitutional Patient is hypertensive.. Pulse regular and within target range for patient.Marland Kitchen Respirations regular, non-labored and within target range.. Temperature is normal and within the target range for the patient.Marland Kitchen Appears in no distress. Vitals Time Taken: 1:51 PM, Height: 67 in, Weight: 125 lbs, BMI: 19.6, Temperature: 98.7 F, Pulse: 93 bpm, Respiratory Rate: 16 breaths/min, Blood Pressure: 153/74 mmHg, Capillary Blood Glucose: 230 mg/dl. Eyes Conjunctivae clear. No discharge.no icterus. Respiratory work of breathing is normal. Cardiovascular Pedal pulses are palpable on the right. Psychiatric appears at normal baseline. General Notes: Wound exam; the original wound on the medial part of her foot looks really quite good. Small amount of necrotic debris in the inferior part. I did not remove this today. ooShe still has the original IandD site purulent drainage coming out of this. It seems a little more localized to around this area but she has been milking it twice a day. Integumentary  (Hair, Skin) Still erythema on the right lateral leg. Wound #43 status is Open. Original cause of wound was Gradually Appeared. The wound is located on the Right,Medial Foot. The wound measures 5.7cm length x 4cm width x 0.9cm depth; 17.907cm^2 area and 16.116cm^3 volume. There is muscle, tendon, and Fat Layer (Subcutaneous Tissue) Exposed exposed. There is no tunneling or undermining noted. There is a large amount of purulent drainage noted. The wound margin is distinct with the outline attached to the wound base. There is large (67-100%) pink granulation within the wound bed. There is a small (1-33%) amount of necrotic tissue within the wound bed including Adherent Slough and Necrosis of Muscle. Wound #44 status is Open. Original cause of wound was Gradually Appeared. The wound is located on the Right Calcaneus. The wound measures 0.7cm length x 0.5cm width x 0.1cm depth; 0.275cm^2 area and 0.027cm^3 volume. There is no tunneling or undermining noted. There is a none present amount of drainage noted. The wound margin is flat and intact. There is no granulation within the wound bed. There is a large (67-100%) amount of necrotic tissue within the wound bed including Eschar. General Notes: scabbed area noted. Wound #46 status is Open. Original cause of wound was Other Lesion. The wound is located on the Right,Lateral Lower Leg. The wound measures 1.3cm length x 1.5cm width x 1.9cm depth; 1.532cm^2 area and 2.91cm^3 volume. There is Fat Layer (Subcutaneous Tissue) Exposed exposed. Tunneling has been noted at 4:00 with a maximum distance of 4.7cm. Undermining begins at 9:00 and ends at 2:00 with a maximum distance of 2cm. There is a large amount of purulent drainage noted. The wound margin is well defined and not attached to the wound base. There is medium (34-66%) red, pink granulation within the wound bed. There is a medium (34-66%) amount of necrotic tissue within the wound bed including  Adherent Slough. Wound #47 status is Open. Original cause of wound was Gradually Appeared.  The wound is located on the Right,Anterior Lower Leg. The wound measures 0.3cm length x 0.4cm width x 0.1cm depth; 0.094cm^2 area and 0.009cm^3 volume. There is Fat Layer (Subcutaneous Tissue) Exposed exposed. There is no tunneling or undermining noted. There is a none present amount of drainage noted. There is no granulation within the wound bed. There is no necrotic tissue within the wound bed. General Notes: scabbed over. 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 Dressing Change Frequency: Wound #43 Right,Medial Foot: Change dressing three times week. Wound #44 Right Calcaneus: Change dressing three times week. Wound #46 Right,Lateral Lower Leg: Change dressing three times week. Wound #47 Right,Anterior Lower Leg: Change dressing three times week. Wound Cleansing: Clean wound with Wound Cleanser - Anasept to all wounds Primary Wound Dressing: Wound #43 Right,Medial Foot: Calcium Alginate with Silver Wound #44 Right Calcaneus: Calcium Alginate with Silver Wound #46 Right,Lateral Lower Leg: Calcium Alginate with Silver - do not pack, place over wound bed Wound #47 Right,Anterior Lower Leg: Calcium Alginate with Silver Secondary Dressing: Wound #43 Right,Medial Foot: Kerlix/Rolled [name] ABD pad Wound #44 Right Calcaneus: Kerlix/Rolled [name] Dry [name] Heel Cup Wound #46 Right,Lateral Lower Leg: Kerlix/Rolled [name] ABD pad Wound #47 Right,Anterior Lower Leg: Kerlix/Rolled [name] ABD pad Edema Control: Elevate legs to the level of the heart or above for 30 minutes daily and/or when sitting, a frequency of: - throughout the  day Home Health: [facility] for wound care. - Interim 1. Continue with silver alginate to both wound areas 2. We are arranging a consultation with orthopedics at [facility]. Once this comes through I will try to communicate with him. 3. The original wound on the right medial foot looks exceptional 4. The extent of the osteomyelitis in her right foot makes limb salvage here difficult to fathom although she is had an extensive treatment of vancomycin and cefepime Electronic Signature(s) Signed: 02/09/2019 5:25:49 PM By: Linton Ham MD Entered By: Linton Ham on 02/09/2019 15:37:46 -------------------------------------------------------------------------------- SuperBill Details Patient Name: Date of Service: [name] 02/09/2019 Medical Record Number:[id number] Patient Account Number: [id number] Date of Birth/Sex: Treating RN: July 15, 1970 (48 y.o. Nancy Fetter Primary Care Provider: [name] Other Clinician: Referring Provider: Treating Provider/Extender:Tadd Holtmeyer, [name], [name] in Treatment: 22 Diagnosis Coding ICD-10 Codes Code Description [phone] Other chronic osteomyelitis, right ankle and foot L97.514 Non-pressure chronic ulcer of other part of right foot with necrosis of bone E10.621 Type 1 diabetes mellitus with foot ulcer L02.415 Cutaneous abscess of right lower limb Facility Procedures CPT4 Code: [id number] Description: 63893 - WOUND CARE VISIT-LEV 5 EST PT Modifier: Quantity: 1 Physician Procedures CPT4 Code Description: [id number] [place] - WC PHYS LEVEL 3 - EST PT ICD-10 Diagnosis Description L97.514 Non-pressure chronic ulcer of other part of right foot M86.671 Other chronic osteomyelitis, right ankle and foot L02.415 Cutaneous abscess of right lower  limb Modifier: with necrosis of Quantity: 1 bone Electronic Signature(s) Signed: 02/09/2019 5:46:53 PM By: [name] RN, BSN Signed: 02/10/2019 5:17:49 PM  By: Linton Ham MD Previous Signature: 02/09/2019 5:25:49 PM Version By: Linton Ham MD Entered By: [name] on 02/09/2019 17:40:51

## 2019-02-16 ENCOUNTER — Other Ambulatory Visit: Payer: Self-pay

## 2019-02-16 ENCOUNTER — Encounter (HOSPITAL_BASED_OUTPATIENT_CLINIC_OR_DEPARTMENT_OTHER): Payer: Medicare HMO | Admitting: Internal Medicine

## 2019-02-16 DIAGNOSIS — E10621 Type 1 diabetes mellitus with foot ulcer: Secondary | ICD-10-CM | POA: Diagnosis not present

## 2019-02-16 NOTE — Progress Notes (Signed)
[name] ([id number]) Visit Report for 02/16/2019 HPI Details Patient Name: Date of Service: [name] 02/16/2019 1:30 PM Medical Record Number:[id number] Patient Account Number: [id number] Date of Birth/Sex: Treating RN: [id number] (48 y.o. [name] Primary Care Provider: Daisy [name] Other Clinician: Referring Provider: Treating Provider/Extender:[name], [name], [name] in Treatment: 78 History of Present Illness HPI Description: 48 year old diabetic who is known to have type 1 diabetes which is poorly controlled last hemoglobin A1c was 11%. She comes in with a ulcerated area on the left lateral foot which has been there for over 6 months. Was recently she has been treated by [name] of podiatry who saw her last on 05/28/2016. Review of his notes revealed that the patient had incision and drainage with placement of antibiotic beads to the left foot on 04/11/2016 for possible osteomyelitis of the cuboid bone. Over the last year she's had a history of amputation of the left fifth toe and a femoropopliteal popliteal bypass graft somewhere in April 2017. 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 His note [name] mentions that the patient has been referred to me for further wound care and possibly great candidate for hyperbaric oxygen therapy due to recurrent osteomyelitis. However we do not have any x-rays of biopsy reports confirming this. He has been on several antibiotics including Bactrim and most recently is on doxycycline for an MRSA. I understand, the patient was not a candidate for IV antibiotics as she has had previous PICC lines which resulted in blood clots in both arms. There was a x-ray report dated 04/04/2016 on [name] notes which showed evidence of fifth ray resection left foot with osteolytic changes noted to the fourth metatarsal and cuboid bone on the left. 06/13/2016 -- had a left foot x-ray which showed  no acute fracture or dislocation and no definite radiographic evidence of osteomyelitis. Advanced osteopenia was seen. 06/20/2016 -- she has noticed a new wound on the right plantar foot in the region where she had a callus before. 06/27/16- the patient did have her x-ray of the right foot which showed no findings to suggest osteomyelitis. She saw her endocrinologist, [name], yesterday. Her A1c in January was 11. He also indicates mismanagement and noncompliance regarding her diabetes. She is currently on Bactrim for a lip infection. She is complaining of nausea, vomiting and diarrhea. She is unable to articulate the exact orders or dosing of the Bactrim; it is unclear when she will complete this. 07/04/2016 -- results from [facility] of ABIs with ankle waveforms were noted from 02/14/2016. The examination done on 06/27/2015 showed noncompressible ABIs with the right being 1.45 and the left being 1.33. The present examination showed a right ABI of 1.19 on the left of 1.33. The conclusion was that right normal ABI in the lower extremity at rest however compared to previous study which was noncompressible ABI may be falsely elevated side suggesting medial calcification. The left ABI suggested medial calcification. 08/01/2016 -- the patient had more redness and pain on her right foot and did not get to come to see as noted she see her PCP or go to the ER and decided to take some leftover metronidazole which she had at home. As usual, the patient does report she feels and is rather noncompliant. 08/08/2016 -- -- x-ray of the right foot -- FINDINGS:Transmetatarsal amputation is noted. No bony destruction is noted to suggest osteomyelitis. IMPRESSION: No evidence of osteomyelitis. Postsurgical changes are seen. MRI would be more sensitive  for possible bony changes. Culture has grown Serratia Marcescens -- sensitive to Bactrim, ciprofloxacin, ceftazidime she was seen by [name] on  08/06/2016. He did not find any exposed bone, muscle, tendon, ligament or joint. There was no malodor and he did a excisional debridement in the office. ============ Old notes: 48 year old patient who is known to the wound clinic for a while had been away from the wound clinic since 09/01/2014. Over the last several months she has been admitted to various hospitals including [facility]. She was treated for a right metatarsal osteomyelitis with a transmetatarsal amputation and this was done about 2 months ago. He has a small ulcerated area on the right heel and she continues to have an ulcerated area on the left plantar aspect of the foot. The patient was recently admitted to the [facility] group between 7/12 and 10/18/2014. she was given 3 weeks of IV vancomycin and was to follow-up with her surgeons at [facility] and also took oral vancomycin for C. difficile colitis. 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 MRI of the left foot done on 10/14/2014 showed no abscess or osteomyelitis. 04/27/15; this is a patient we know from previous stays in the wound care center. She is a type I diabetic I am not sure of her control currently. Since the last time I saw her she is had a right transmetatarsal amputation and has no wounds on her right foot and has no open wounds. She is been followed at the wound care center at [facility] in [place]. She comes today with the desire to undergo hyperbaric treatment locally. Apparently one of her wound care providers in [place] has suggested hyperbarics. This is in response to an MRI from 04/18/15 that showed increased marrow signal and loss of the proximal  fifth metatarsal cortex evidence of osteomyelitis with likely early osteomyelitis in the cuboid bone as well. She has a large wound over the base of the fifth metatarsal. She also has a eschar over her the tips of her toes on 1,3 and 5. She does not have peripheral pulses and apparently is going for an angiogram tomorrow which seems reasonable. After this she is going to infectious disease at [facility]. They have been using Medihoney to the large wound on the lateral aspect of the left foot to. The patient has known Charcot deformity from diabetic neuropathy. She also has known diabetic PAD. Surprisingly I can't see that she has had any recent antibiotics, the patient states the last antibiotic she had was at the end of November for 10 days. I think this was in response to culture that showed group G strep although I'm not exactly sure where the culture was from. She is also had arterial studies on 03/29/15. This showed a right ABI of 1.4 that was noncompressible. Her left ABI was 0.73. 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 It was not felt that arterial inflow was adequate for healing of a foot ulcer. Her Doppler waveforms looked monophasic ===== READMISSION 02/28/17; this is in an now 48 year old woman we've had at several different occasions in this clinic. 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 She has a remote ex-smoker. She was last seen in this  clinic by Dr. Con Memos I think in May. More recently she is been followed by her podiatrist [name] an infectious disease Dr. Megan Salon. She has 2 open wounds the major one is over the right first metatarsal head she also has a wound on the left plantar foot. an MRI of the right foot on 01/01/17 showed a soft tissue ulcer along the plantar aspect of the first metatarsal base consistent with osteomyelitis of the first metatarsal stump. Dr. 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 According to the patient this is been open for slightly over a month. She has been on a combination of Cipro 500 twice a day, Zyvox 600 twice a day and Flagyl 500 3 times a day for over a month now as directed by Dr. Megan Salon. cultures of the right foot earlier this year showed MRSA in January and Serratia in May. January also had a few viridans strep. Recent x-rays of both feet were done and [name] office and I don't have these reports. The patient has known PAD and has a history of aleft femoropopliteal bypass in April 2017. 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 Dwyane Dee. In spite of this the last hemoglobin A1c I can see is 10.1 on 01/01/2017. She is being referred by 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 She is been using Medihoney to this area and also an area on the left midfoot. She is using healing sandals bilaterally. ABIs in this clinic at the left posterior tibial was 1.1 noncompressible on the right READMISSION Non invasive vascular NOVANT 5/18 Aftercare following surgery of the circulatory system Procedure Note - Interface, External Ris In - 08/13/2016 11:05 AM EDT Procedure: Examination consists of physiologic resting arterial pressures of the brachial and ankle arteries bilaterally with continuous wave Doppler waveform analysis. Previous: Previous exam performed on 02/14/16 demonstrated ABIs of Rt = 1.19 and Lt = 1.33. Right: ABI = non-compressible PT, 1.47 DP. S/P transmet amputation. Left: ABI = 1.52, 2nd digit pressure = 87 mmHg Conclusions: Right: ABI (>1.3) may be falsely elevated, suggesting medial calcification. Left: ABI (>1.3) may be  falsely elevated, suggesting medial calcification The patient is a now 48 year old type I diabetic is had multiple issues her graded to chronic diabetic foot ulcers. 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 We had her in the clinic lastin November. At that point she had wounds on her bilateral feet.she had wanted to try hyperbarics however the healogics review process denied her because she hadn't followed up with her vascular surgeon for her left femoropopliteal bypass. The bypass was done by Dr. Raul Del at [facility] We made her a follow-up with Dr. Raul Del however she did not keep the appointment and therefore she was not approved The patient shows me a small wound on her left fourth metatarsal head on her phone. She developed rapid discoloration in the plantar aspect of the left foot and she was admitted to hospital from 2/2 through 05/10/17 with wet gangrene of the left foot osteomyelitis of the fourth metatarsal heads. She was admitted acutely ill with a temperature of 103. She was started on broad-spectrum vancomycin and cefepime. On 05/06/17  she was taken to the OR by [name] her podiatric surgeon for an incision and drainage irrigation of the left foot wound. Cultures from this surgery revealed group be strep and anaerobes. she was seen by Dr.Xu of orthopedic surgery and scheduled for a below-knee amputation which she u refused. Ultimately she was discharged on Levaquin and Flagyl for one month. MRI 05/05/17 done while she was in the hospital showed abscess adjacent to the fourth metatarsal head and neck small abscess around the fourth flexor tendon. Inflammatory phlegmon and gas in the soft tissues along the lateral aspect of the fourth phalanx. Findings worrisome for osteomyelitis involving the fourth proximal and middle phalanx and also the third and fourth metatarsals. Finally the patient had actually  shortly before this followed up with Dr. Raul Del at no time on 04/29/17. He felt that her left femoropopliteal bypass was patent he felt that her left-sided toe pressures more than adequate for healing a wound on the left foot. This was before her acute presentation. 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 05/28/17; she is started hyperbarics. The patient tells me that for some reason she was not actually on Levaquin but I think on ciprofloxacin. She was on Flagyl. She only started her Levaquin yesterday due to some difficulty with the pharmacy and perhaps her sister picking it up. She has an appointment with [name] tomorrow and with infectious disease early next week. She has no new complaints 06/06/17; the patient continues in hyperbarics. She saw [name] on 05/29/17 who is her podiatric surgeon. He is elected for a transmetatarsal amputation on 06/27/17. I'm not sure at what level he plans to do this amputation. The patient is unaware She also saw Dr. Megan Salon of infectious disease who elected to continue her on current antibiotics I think this is ciprofloxacin and Flagyl. I'll need to clarify with her tomorrow if she actually has this. We're using silver alginate to the actual wound. Necrotic surface today with material under the flap of her foot. Original MRI showed abscesses as well as osteomyelitis of the proximal and middle fourth phalanx and the third and fourth metatarsal heads 06/11/17; patient continues in hyperbarics and continues on oral antibiotics. She is doing well. The wound looks better. The necrotic part of this under the flap in her superior foot also looks better. she is been to see [name]. I haven't had a chance to look at his note. Apparently he has put the transmetatarsal amputation on hold her request it is still planning to take her to the OR for debridement and product application ACEL. I'll see if I can find his note. I'll therefore leave product ordering/requests  to [name] for now. I was going to look at Dermagraft 06/18/17-she is here in follow-up evaluation for bilateral foot wounds. She continues with hyperbaric therapy. She states she has been applying manuka honey to the right plantar foot and alternate manuka honey and silver alginate to the left foot, despite our orders. We will continue with same treatment plan and she will follo up next week. 06/25/17; I have reviewed [name] last note from 3/11. She has operative debridement in 2 days' time. By review his note apparently they're going to place there is skin over the majority of this wound which is a good choice. She has a small satellite area at the most proximal part of this wound on the left plantar foot. The area on the right plantar foot we've been using silver alginate and it is  close to healing. 07/02/17; unfortunately the patient was not easily approved for [name] proposed surgery. I'm not completely certain what the issue is. She has been using silver alginate to the wound she has completed a first course of hyperbarics. She is still on Levaquin and Flagyl. I have really lost track of the time course here.I suspect she should have another week to 2 of antibiotics. I'll need to see if she is followed up with infectious disease Dr. Megan Salon 07/09/17; the patient is followed up with Dr. Megan Salon. 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 She continues on Levaquin and metronidazole continuing both of these for now I think she is been on fr about 6 weeks. She still has some drainage but no pain. No fever. Her had been plans for her to go to the OR for operative debridement with her podiatrist [name], I am not exactly sure where that is. I'll probably slip a note to [name] today. I note that she follows with Dr. Dwyane Dee of endocrinology. We have her recertified for hyperbaric oxygen. I have not heard about Dermagraft however I'll see if [name]  is planning a skin substitute as well 07/16/17; the patient tells me she is just about out of [place]. I'll need to check Dr. Hale Bogus last notes on this. She states she has plenty of Flagyl however. She comes in today complaining of pain in the right lateral foot which she said lasted for about a day. The wound on the right foot is actually much more medially. She also tells me that the [facility] cost a lot of pain in the left foot wound and she turned back to silver alginate. Finally Dermagraft has a $831 per application co-pay. She cannot afford this 07/23/17; patient arrives today with the wound not much smaller. There is not much new to add. She has not heard from [name] all try to put in a call to them today. She was asking about Dermagraft again and she has an over $517 per application co-pay she states that she would be willing to try to do a payment plan. I been tried to avoid this. We've been using silver alginate, I'll change to [facility] 07/30/17-She is here in follow-up evaluation for left foot ulcer. She continues hyperbaric medicine. The left foot ulcer is stable we will continue with same treatment plan 08/06/17; she is here for evaluation of her left foot ulcer. Currently being treated for hyperbarics or underlying osteomyelitis. She is completed antibiotics. The left foot ulcer is better smaller with healthier looking granulation. For various reasons I am not really clear on we never got her back to the OR with [name]. He did not respond to my secure text message. Nevertheless I think that surgery on this point is not necessary nor am I completely clear that a skin substitute is necessary The patient is complaining about pain on the outside of her right foot. 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 There is no erythema. She also states the foot is warm versus her other part of her upper leg and this is largely true. It is not totally clear to me  what's causing this. She thinks it's different from her usual neuropathy pain 08/13/17; she arrives in clinic today with a small wound which is superficial on her right first metatarsal head. 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 She tells [place] she was up on  her feet over the Mother's Day celebration. The large wound is on the left foot. Continues with hyperbarics for underlying osteomyelitis. We're using Hydrofera Blue. She asked me today about where we were with Dermagraft. I had actually excluded this because of the co- pay however she wants to assume this therefore I'll recheck the co-pay an order for next week. 08/20/17; the patient agreed to accept the co-pay of the first Apligraf which we applied today. She is disappointed she is finishing hyperbarics will run this through the insurance on the extent of the foot infection and the extent of the wound that she had however she is already had 60 dive's. Dermagraft No. 1 08/27/17; Dermagraft No. 2. She is not eligible for any more hyperbaric treatments this month. She reports a fair amount of drainage and she actually changed to the external dressings without disturbing the direct contact layer 09/03/17; the patient arrived in clinic today with the wound superficially looking quite healthy. Nice vibrant red tissue with some advancing epithelialization although not as much adherence of the flap as I might like. However she noted on her own fourth toe some bogginess and she brought that to our attention. Indeed this was boggy feeling like a possibility of subcutaneous fluid. 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 She is not been unwell. We've been using Dermagraft 09/10/17; the culture that I did not last week was MRSA. She saw Dr. Megan Salon this morning who is going to start her on vancomycin. I had sent him a secure a text message yesterday. I also spoke  with her podiatric surgeon [name] about surgery on this foot the options for conserving a functional foot etc. Promised me he would see her and will make back consultation today. Paradoxically her actual wound on the plantar aspect of her left foot looks really quite good. I had given her 5 days worth of Baxdella to cover her for MRSA. Her MRI came back showing osteomyelitis within the third metatarsal shaft and head and base of the third and fourth proximal phalanx. She had extensive inflammatory changes throughout the soft tissue of the lateral forefoot. With an ill-defined fluid around the fourth metatarsal extending into the plantar and dorsal soft tissues 09/19/17; the patient is actually on oral Septra and Flagyl. She apparently refused IV vancomycin. She also saw [name] at my request who is planning her for a left BKA sometime in mid July. MRI showed osteomyelitis within the third metatarsal shaft and head and the basis of the third and fourth proximal phalanx. I believe there was felt to be possible septic arthritis involving the third MTP. 09/26/17; the patient went back to Dr. Megan Salon at my suggestion and is now receiving IV daptomycin. Her wound continues to look quite good making the decision to proceed with a transmetatarsal amputation although more difficult for the patient. I believe in my extensive discussions with her she has a good sense of the pros and cons of this. I don't NV the tuft decision she has to make. She has an appointment with [name] I believe in mid July and I previously spoken to him about this issue Has we had used 3 previous Dermagraft. Given the condition of the wound surface I went ahead and added the fourth one today area and I did this not fully realizing that she'll be traveling to [place] next week. I'm hopeful she can come  back in 2 weeks 10/21/17; Her same Dermagraft on for about 3-1/2 weeks. In spite of this the wound arrives looking quite  healthy. There is been a lot of healing dimensions are smaller. Looking at the square shaped wound she has now there is some undermining and some depth medially under the undermining although I cannot palpate any bone. No surrounding infection is obvious. She has difficult questions about how to look at this going forward vis--vis amputations versus continued medical therapy. To be truthful the wound is looks so healthy and it is continued to contract. Hard to justify foot surgery at this point although I still told her that I think it might come to that if we are not able to eradicate the underlying MRSA. She is still highly at risk and she understands this 11/06/17 on evaluation today patient appears to be doing better in regard to her foot ulcer. She's been tolerating the dressing changes without complication. Currently she is here for her Dermagraft #6. Her wound continues to make excellent progress at this point. She does not appear to have any evidence of infection which is good news. 11/13/17 on evaluation today patient appears to be doing excellent at this time. She is here for repeat Dermagraft application. This is #7. Overall her wound seems to be making great progress. 12/05/17; the patient arrives with the wound in much better condition than when I last saw this almost 6 weeks ago. She still has a small probing area in the left metatarsal head region on the lateral aspect of her foot. We applied her last Dermagraft today. Since the last time she is here she has what appears to a been a blood blister on the plantar aspect of left foot although I don't see this is threatening. There is also a thick raised tissue on the right mid metatarsal head region. This was not there I don't think the last time she was here 3 weeks ago. 12/12/17; the patient continues to have a small programming area in the left metatarsal head region on the lateral aspect of her foot which was the initial large surgical  wound. I applied her last Apligraf last week. I'm going to use Endoform starting today Unfortunately she has an excoriated area in the left mid foot and the right mid foot. The left midfoot looks like a blistered area this was not opened last week it certainly is open today. Using silver alginate on these areas. She promises me she is offloading this. 12/19/17; the small probing area in the left metatarsal head eyes think is shallower. In general her original wound looks better. We've been using Endoform. The area inferiorly that I think was trauma last week still requires debridement a lot of nonviable surface which I removed. She still has an open open area distally in her foot Similarly on the right foot there is tightly adherent surface debris which I removed. Still areas that don't look completely epithelialized. This is a small open area. We used silver alginate on these areas 12/26/2017; the patient did not have the supplies we ordered from last week including the Endoform. The original large wound on the left lateral foot looks healthy. She still has the undermining area that is largely unchanged from last week. She has the same heavily callused raised edged wounds on the right mid and left midfoot. Both of these requiring debridement. We have been using silver alginate on these areas 01/02/2018; there is still supply issues. We are going to try  to use Prisma but I am not sure she actually got it from what she is saying. She has a new open area on the lateral aspect of the left fourth toe [previous fifth ray amputation]. Still the one tunneling area over the fourth metatarsal head. The area is in the midfoot bilaterally still have thick callus around them. She is concerned about a raised swelling on the lateral aspect of the foot. However she is completely insensate 01/10/2018; we are using Prisma to the wounds on her bilateral feet. Surprisingly the tunneling area over the left fourth  metatarsal head that was part of her original surgery has closed down. She has a small open area remaining on the incision line. 2 open areas in the midfoot. 02/10/2018; the patient arrives back in clinic after a month hiatus. She was traveling to visit family in [place]. Is fairly clear she was not offloading the areas on her feet. The original wound over the left lateral foot at the level of metatarsal heads is reopened and probes medially by about a centimeter or 2. She notes that a week ago she had purulent drainage come out of an area on the left midfoot. Paradoxically the worst area is actually on the right foot is extensive with purulent drainage. We will use silver alginate today 02/17/2018; the patient has 3 wounds one over the left lateral foot. She still has a small area over the metatarsal heads which is the remnant of her original surgical wound. This has medial probing depth of roughly 1.4 cm somewhat better than last week. The area on the right foot is larger. We have been using silver alginate to all areas. The area on the right foot and left foot that we cultured last week showed both Klebsiella and Proteus. Both of these are quinolone sensitive. The patient put her's self on Bactrim and Flagyl that she had left hanging around from prior antibiotic usages. She was apparently on this last week when she arrived. I did not realize this. Unfortunately the Bactrim will not cover either 1 of these organisms. We will send in Cipro 500 twice daily for a week 03/04/2018; the patient has 2 wounds on the left foot one is the original wound which was a surgical wound for a deep DFU. At one point this had exposed bone. She still has an area over the fourth metatarsal head that probes about 1.4 cm although I think this is better than last week. I been using silver nitrate to try and promote tissue adherence and been using silver alginate here. She also has an area in the left midfoot. This has  some depth but a small linear wound. Still requiring debridement. On the right midfoot is a circular wound. A lot of thick callus around this area. We have been using silver alginate to all wound areas She is completed the ciprofloxacin I gave her 2 weeks ago. 03/11/2018; the patient continues to have 2 open areas on the left foot 1 of which was the original surgical wound for a deep DFU. Only a small probing area remains although this is not much different from last week we have been using silver alginate. The other area is on the midfoot this is smaller linear but still with some depth. We have been using silver alginate here as well On the right foot she has a small circular wound in the mid aspect. This is not much smaller than last time. We have been using silver alginate here as well  03/18/2018; she has 3 wounds on the left foot the original surgical wound, a very superficial wound in the mid aspect and then finally the area in the mid plantar foot. She arrives in today with a very concerning area in the wound in the mid plantar foot which is her most proximal wound. There is undermining here of roughly 1-1/2 cm superiorly. Serosanguineous drainage. She tells me she had some pain on for over the weekend that shot up her foot into her thigh and she tells me that she had a nodule in the groin area. She has the single wound in the right foot. We are using endoform to both wound areas 03/24/2018; the patient arrives with the original surgical wound in the area on the left midfoot about the same as last week. There is a collection of fluid under the surface of the skin extending from the surgical wound towards the midfoot although it does not reach the midfoot wound. The area on the right foot is about the same. Cultures from last week of the left midfoot wound showed abundant Klebsiella abundant Enterococcus faecalis and moderate methicillin resistant staph I gave her Levaquin but this would  have only covered the Klebsiella. She will need linezolid 04/01/2018; she is taking linezolid but for the first few days only took 1 a day. I have advised her to finish this at twice daily dosing. In any case all of her wounds are a lot better especially on the left foot. The original surgical wound is closed. The area on the left midfoot considerably smaller. The area on the right foot also smaller. 04/08/2018; her original surgical wound/osteomyelitis on the left foot remains closed. She has area on the left foot that is in the midfoot area but she had some streaking towards this. This is not connected with her original wound at least not visually. Small wound on the right midfoot appears somewhat smaller. 04/15/18; both wounds looks better. Original wound is better left midfoot. Using silver alginate 1/21; patient states she uses saltwater soak in, stones or remove callus from around her wounds. She is also concerned about a blood blister she had on the left foot but it simply resolved on its own. We've been using silver alginate 1/28; the patient arrives today with the same streaking area from her metatarsals laterally [the site of her original surgical wound] down to the middle of her foot. There is some drainage in the subcutaneous area here. This concerns me that there is actually continued ongoing infection in the metatarsals probably the fourth and third. This fixates an MRI of the foot without contrast [chronic renal failure] The wound in the mid part of the foot is small but I wonder whether this area actually connects with the more distal foot. The area on the right midfoot is probably about the same. Callus thick skin around the small wound which I removed with a curette we have been using silver alginate on both wound areas 2/4; culture I did of the draining site on the left foot last time grew methicillin sensitive staph aureus. MRI of the left foot showed interval resolution of the  findings surrounding the third metatarsal joint on the prior study consistent with treated osteomyelitis. Chronic soft tissue ulceration in the plantar and lateral aspect of the forefoot without residual focal fluid collection. No evidence of recurrent osteomyelitis. Noted to have the previous amputation of the distal first phalanx and fifth ray MRI of the right foot showed no evidence of  osteomyelitis I am going to treat the patient with a prolonged course of antibiotics directed against MSSA in the left foot 2/11; patient continues on cephalexin. She tells me she had nausea and vomiting over the weekend and missed 2 days. In general her foot looks much the same. She has a small open area just below the left fourth metatarsal head. A linear area in the left midfoot. Some discoloration extending from the inferior part of this into the left lateral foot although this appears to be superficial. She has a small area on the right midfoot which generally looks smaller after debridement 2/18; the patient is completing his cephalexin and has another 2 days. She continues to have open areas on the left and right foot. 2/25; she is now off antibiotics. The area on the left foot at the site of her original surgical wound has closed yet again. She still has open areas in the mid part of her foot however these appear smaller. The area on the right mid foot looks about the same. We have been using silver alginate She tells me she had a serious hypoglycemic spell at home. She had to have EMS called and get IV dextrose 3/3; disappointing on the left lateral foot large area of necrotic tissue surrounding the linear area. This appears to track up towards the same original surgical wound. Required extensive debridement. The area on the right plantar foot is not a lot better also using silver 3/12; the culture I did last time showed abundant enterococcus. I have prescribed Augmentin, should cover any unrecognized  anaerobes as well. In addition there were a few MRSA and Serratia that would not be well covered although I did not want to give her multiple antibiotics. She comes in today with a new wound in the right midfoot this is not connected with the original wound over her MTP a lot of thick callus tissue around both wounds but once again she said she is not walking on these areas 3/17-Patient comes in for follow-up on the bilateral plantar wounds, the right midfoot and the left plantar wound. Both these are heavily callused surrounding the wounds. We are continuing to use silver alginate, she is compliant with offloading and states she uses a wheelchair fairly often at home 3/24; both wound areas have thick callus. However things actually look quite a bit better here for the majority of her left foot and the right foot. 3/31; patient continues to have thick callused somewhat irritated looking tissue around the wounds which individually are fairly superficial. There is no evidence of surrounding infection. We have been using silver alginate however I change that to [facility] today 4/17; patient returns to clinic after having a scare with Covid she tested negative in her primary doctor's office. She has been using Hydrofera Blue. She does not have an open area on the right foot. On the left foot she has a small open area with the mid area not completely viable. She showed me pictures of what looks like a hemorrhagic blister from several days ago but that seems to have healed over this was on the lateral left foot 4/21; patient comes in to clinic with both her wounds on her feet closed. However over the weekend she started having pain in her right foot and leg up into the thigh. She felt as though she was running a low-grade fever but did not take her temperature. She took a doxycycline that she had leftover and yesterday a single Septra  and metronidazole. She thinks things feel somewhat  better. 4/28; duplex ultrasound I ordered last week was negative for DVT or superficial thrombophlebitis. She is completed the doxycycline I gave her. States she is still having a lot of pain in the right calf and right ankle which is no better than last week. She cannot sleep. She also states she has a temperature of up to 101, coughing and complaining of visual loss in her bilateral eyes. Apparently she was tested for Covid 2 weeks ago at [facility] and that was negative. Readmission: 09/03/18 patient presents back for reevaluation after having been evaluated at the end of April regarding erythema and swelling of her right lower extremity. Subsequently she ended up going to the hospital on 07/29/18 and was admitted not to be discharged until 08/08/18. Unfortunately it was noted during the time that she was in the hospital that she did have methicillin-resistant Staphylococcus aureus as the infection noted at the site. It was also determined that she did have osteomyelitis which appears to be fairly significant. She was treated with vancomycin and in fact is still on IV vancomycin at dialysis currently. This is actually slated to continue until 09/12/18 at least which will be the completion of the six weeks of therapy. Nonetheless based on what I'm seeing at this point I'm not sure she will be anywhere near ready to discontinue antibiotics at that time. Since she was released from the hospital she was seen by [name] who is her podiatrist on 08/27/18. His note specifically states that he is recommended that the patient needs of one knee amputation on the right as she has a life-threatening situation that can lead quickly to sepsis. The patient advised she would like to try to save her leg to which [name] apparently told her that this was against all medical advice. She also want to discontinue the Wound VAC which had been initiated due to the fact that she wasn't pleased with how the wound was looking  and subsequently she wanted to pursue applying Medihoney at that time. He stated that he did not believe that the right lower extremity was salvageable and that the patient understood but would still like to attempt hyperbaric option therapy if it could be of any benefit. She was therefore referred back to [place] for further evaluation. He plans to see her back next week. Upon inspection today patient has a significant amount purulent drainage noted from the wound at this point. The bone in the distal portion of her foot also appears to be extremely necrotic and spongy. When I push down on the bone it bubbles and seeps purulent drainage from deeper in the end of the foot. I do not think that this is likely going to heal very well at all and less aggressive surgical debridement were undertaken more than what I believe we can likely do here in our office. 09/12/2018; I have not seen this patient since the most recent hospitalization although she was in our clinic last week. I have reviewed some of her records from a complex hospitalization. 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 There is situation was complicated by MRSA bacteremia and acute on chronic renal failure now on dialysis. She is receiving vancomycin at dialysis. We started her on Dakin's wet-to- dry last week she is changing this daily. There is still purulent drainage coming out of her foot. Although she is apparently "agreeable" to a below-knee amputation which  is been suggested by multiple clinicians she wants this to be done in [place]. She apparently has a telehealth visit with that provider sometime in late [place] 6/24. I have told her I think this is probably too long. Nevertheless I could not convince her to allow a local doctor to perform BKA. 09/19/2018; the patient has a large necrotic area on the right anterior foot. She has had previous transmetatarsal amputations. Culture I did  last week showed MRSA nothing else she is on vancomycin at dialysis. She has continued leaking purulent drainage out of the distal part of the large circular wound on the right anterior foot. She apparently went to see Dr. 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 Somehow that translated into her being referred to plastic surgery for debridement of the area. I gather she basically refused amputation although I do not have a copy of Dr. Berenice Primas notes. The patient really wants to have a trial of hyperbaric oxygen. I agreed with initial assessment in this clinic that this was probably too far along to benefit however if she is going to have plastic surgery I think she would benefit from ancillary hyperbaric oxygen. The issue here is that the patient has benefited as maximally as any patient I have ever seen from hyperbaric oxygen therapy. Most recently she had exposed bone on the lateral part of her left foot after a surgical procedure and that actually has closed. She has eschared areas in both heels but no open area. She is remained systemically well. I am not optimistic that anything can be done about this but the patient is very clear that she wants an attempt. The attempt would include a wound VAC further debridements and hyperbaric oxygen along with IV antibiotics. 6/26; I put her in for a trial of hyperbaric oxygen only because of the dramatic response she has had with wounds on her left midfoot earlier this year which was a surgical wound that went straight to her bone over the metatarsal heads and also remotely the left third toe. We will see if we can get this through our review process and insurance. She arrives in clinic with again purulent material pouring out of necrotic bone on the top of the foot distally. There is also some concerning erythema on the front of the leg that we marked. It is bit difficult to tell how tender this is because of neuropathy. I  note from infectious disease that she had her vancomycin extended. 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 She had the wound VAC on for part of the week. The rest of the time she is putting various things on this including Medihoney, "ionized water" silver sorb gel etc. 7/7; follow-up along with HBO. She is still on vancomycin at dialysis. She has a large open area on the dorsal right foot and a small dark eschar area on her heel. There is a lot less erythema in the area and a lot less tenderness. From an infection point of view I think this is better. She still has a lot of necrosis in the remaining right forefoot [previous TMA] we are still using the wound VAC in this area 7/16; follow-up along with HBO. I put her on linezolid after she finished her vancomycin. We started this last Friday I gave her 2 weeks worth. I had the expectation that she would be operatively debrided by Dr. Marla Roe but that still has not happened yet. Patient phoned the  office this week. She arrives for review today after HBO. The distal part of this wound is completely necrotic. Nonviable pieces of tendon bone was still purulent drainage. Also concerning that she has black eschar over the heel that is expanding. I think this may be indicative of infection in this area as well. She has less erythema and warmth in the ankle and calf but still an abnormal exam 7/21 follow-up along with HBO. I will renew her linezolid after checking a CBC with differential monitoring her blood counts especially her platelets. She was supposed to have surgery yesterday but if I am reading things correctly this was canceled after her blood sugar was found to be over 500. I thought Dr. Marla Roe who called me said that they were sending her to the ER but the patient states that was not the case. 7/28. Follow-up along with HBO. She is on linezolid I still do not have any lab work from dialysis even though  I called last week. The patient is concerned about an area on her left lateral foot about the level of the base of her fifth metatarsal. I did not really see anything that ominous here however this patient is in [place] ability to point out problems that she is sensing and she has been accurate in the past Finally she received a call from Dr. Marla Roe who is referring her to another orthopedic surgeon stating that she is too booked up to take her to the operating room now. Was still using a wound VAC on the foot 8/3 -Follow-up after HBO, she is got another week of linezolid, she is to call ID for an appointment, x-rays of both feet were reviewed, the left foot x-ray with third MTP joint osteo- Right foot x-ray widespread osteo-in the right midfoot Right ankle x-ray does not show any active evidence of infection 8/11-Patient is seen after HBO, the wounds on the right foot appear to be about the same, the heel wound had some necrotic base over tendon that was debrided with a curette 8/21; patient is seen after HBO. The patient's wound on her dorsal foot actually looks reasonably good and there is substantial amount of epithelialization however the open area distally still has a lot of necrotic debris partially bone. I cannot really get a good sense of just how deep this probes under the foot. She has been pressuring me this week to order medical maggots through a company in [place] for her. The problem I have is there is not a defined wound area here. On the positive side there is no purulence. She has been to see infectious disease she is still on Septra DS although I have not had a chance to review their notes 8/28; patient is seen in conjunction with HBO. The wounds on her foot continued to improve including the right dorsal foot substantially the, the distal part of this wound and the area on the right heel. We have been using a wound VAC over this chronically. She is still on trimethoprim  as directed by infectious disease 9/4; patient is seen in conjunction with HBO. Right dorsal foot wound substantially anteriorly is better however she continues to have a deep wound in the distal part of this that is not responding. We have been using silver collagen under border foam Area on the right plantar medial heel seems better. We have been using Hydrofera Blue 12/12/18 on evaluation today patient appears to be doing about the same with regard to her wound based  on prior measurements. She does have some necrotic tissue noted on the lateral aspect of the wound that is going require a little bit of sharp debridement today. This includes what appears to be potentially either severely necrotic bone or tendon. Nonetheless other than that she does not appear to have any severe infection which is good news 9/18; it is been 2 weeks since I saw this wound. She is tolerating HBO well. Continued dramatic improvement in the area on the right dorsal foot. She still has a small wound on the heel that we have been using Hydrofera Blue. She continues with a wound VAC 9/24; patient has to be seen emergently today with a swelling on her right lateral lower leg. She says that she told Dr. Evette Doffing about this and also myself on a couple of occasions but I really have no recollection of this. She is not systemically unwell and her wound really looked good the last time I saw this. She showed this to providers at dialysis and she was able to verify that she was started on cephalexin today for 5 doses at dialysis. She dialyzes on Tuesday Thursday and Saturday. 10/2; patient is seen in conjunction with HBO. The area that is draining on the right anterior medial tibia is more extensive. Copious amounts of serosanguineous drainage with some purulence. We are still using the wound VAC on the original wound then it is stable. Culture I did of the original IandD showed MRSA I contacted dialysis she is now on vancomycin  with dialysis treatments. I asked them to run a month 10/9; patient seen in conjunction with HBO. She had a new spontaneous open area just above the wound on the right medial tibia ankle. More swelling on the right medial tibia. Her wound on the foot looks about the same perhaps slightly better. There is no warmth spreading up her leg but no obvious erythema. her MRI of the foot and ankle and distal tib-fib is not booked for next Friday I discussed this with her in great detail over multiple days. it is likely she has spreading infection upper leg at least involving the distal 25% above the ankle. She knows that if I refer her to orthopedics for infectious disease they are going to recommend amputation and indeed I am not against this myself. We had a good trial at trying to heal the foot which is what she wanted along with antibiotics debridement and HBO however she clearly has spreading infection [probably staph aureus/MRSA]. Nevertheless she once again tells me she wants to wait the left of the MRI. She still makes comments about having her amputation done in [place]. 10/19; arrives today with significant swelling on the lateral right leg. Last culture I did showed Klebsiella. Multidrug- resistant. Cipro was intermediate sensitivity and that is what I have her on pending her MRI which apparently is going to be done on Thursday this week although this seems to be moving back and forth. She is not systemically unwell. We are using silver alginate on her major wound area on the right medial foot and the draining areas on the right lateral lower leg 10/26; MRI showed extensive abscess in the anterior compartment of the right leg also widespread osteomyelitis involving osseous structures of the midfoot and portions of the hindfoot. Also suspicion for osteomyelitis anterior aspect of the distal medial malleolus. Culture I did of the purulence once again showed a multidrug-resistant Klebsiella. I  have been in contact with nephrology late last week and  she has been started on cefepime at dialysis to replace the vancomycin We sent a copy of her MRI report to Dr. Geroge Baseman in [place] who is an orthopedic surgeon. The patient takes great stock in his opinion on this. She says she will go to [place] to have her leg amputated if Dr. Geroge Baseman does not feel there is any salvage options. 11/2; she still is not talk to her orthopedic surgeon in [place]. Apparently he will call her at [phone] this afternoon. The quality of this is she has not allowed me to refer her anywhere. She has been told over and over that she needs this amputated but has not agreed to be referred. She tells me her blood sugar was 600 last night but she has not been febrile. 11/9; she never did got a call from the orthopedic surgeon in [place] therefore that is off the radar. We have arranged to get her see orthopedic surgery at [facility]. She still has a lot of draining purulence coming out of the new abscess in her right leg although that probably came from the osteomyelitis in her right foot and heel. Meanwhile the original wound on the right foot looks very healthy. Continued improvement. The issue is that the last MRI showed osteomyelitis in her right foot extensively she now has an abscess in the right anterior lower leg. There is nobody in [place] who will offer this woman anything but an amputation and to be honest that is probably what she needs. I think she still wants to talk about limb salvage although at this point I just do not see that. She has completed her vancomycin at dialysis which was for the original staph aureus she is still on cefepime for the more recent Klebsiella. She has had a long course of both of these antibiotics which should have benefited the osteomyelitis on the right foot as well as the abscess. 11/16; apparently [place] elective surgery is shut down because of COVID-19 pandemic.  I have reached out to some contacts at [facility] to see if we can get her an orthopedic appointment there. I am concerned about continually leaving this but for the moment everything is static. In fact her original large wound on this foot is closing down. It is the abscess on the right anterior leg that continues to drain purulent serosanguineous material. She is not currently on any antibiotics however she had a prolonged course of vancomycin [1 month] as well as cefepime for a month Electronic Signature(s) Signed: 02/16/2019 5:46:17 PM By: Linton Ham MD Entered By: Linton Ham on 02/16/2019 14:29:35 -------------------------------------------------------------------------------- Physical Exam Details Patient Name: Date of Service: [name] 02/16/2019 1:30 PM Medical Record Number:[id number] Patient Account Number: [id number] Date of Birth/Sex: Treating RN: 1971/02/25 (48 y.o. [name] Primary Care Provider: Daisy [name] Other Clinician: Referring Provider: Treating Provider/Extender:[name], [name], [name] in Treatment: 56 Constitutional Patient is hypertensive.. Pulse regular and within target range for patient.Marland Kitchen Respirations regular, non-labored and within target range.. Temperature is normal and within the target range for the patient.Marland Kitchen Appears in no distress. Respiratory work of breathing is normal. Cardiovascular It is difficult to feel her pulse in the right foot because of the position of the wounds but I think if the dorsalis pedis is palpable. Lymphatic None palpable in the popliteal area on the right. Psychiatric appears at normal baseline. Notes Wound exam; the original wound on the medial part of her foot looks really quite good and is contracting continuously. However the necrotic  area on the right anterior lower leg still is about the same draining serosanguineous purulent material. There is a wide amount of undermining in  the small IandD site I did to let this drain. Electronic Signature(s) Signed: 02/16/2019 5:46:17 PM By: Linton Ham MD Entered By: Linton Ham on 02/16/2019 14:30:59 -------------------------------------------------------------------------------- Physician Orders Details Patient Name: Date of Service: [name] 02/16/2019 1:30 PM Medical Record Number:[id number] Patient Account Number: [id number] Date of Birth/Sex: Treating RN: 09-Jul-1970 (48 y.o. [name] Primary Care Provider: Daisy [name] Other Clinician: Referring Provider: Treating Provider/Extender:[name], [name], [name] in Treatment: 25 Verbal / Phone Orders: No Diagnosis Coding ICD-10 Coding Code Description M86.671 Other chronic osteomyelitis, right ankle and foot L97.514 Non-pressure chronic ulcer of other part of right foot with necrosis of bone E10.621 Type 1 diabetes mellitus with foot ulcer L02.415 Cutaneous abscess of right lower limb Follow-up Appointments Return Appointment in 1 week. Dressing Change Frequency Wound #43 Right,Medial Foot Change dressing three times week. Wound #44 Right Calcaneus Change dressing three times week. Wound #46 Right,Lateral Lower Leg Change dressing three times week. Wound #47 Right,Anterior Lower Leg Change dressing three times week. Wound Cleansing Clean wound with Wound Cleanser - Anasept to all wounds Primary Wound Dressing Wound #43 Right,Medial Foot Calcium Alginate with Silver Wound #44 Right Calcaneus Calcium Alginate with Silver Wound #46 Right,Lateral Lower Leg Calcium Alginate with Silver - do not pack, place over wound bed Wound #47 Right,Anterior Lower Leg Calcium Alginate with Silver Secondary Dressing Wound #43 Right,Medial Foot Kerlix/Rolled [name] ABD pad Wound #44 Right Calcaneus Kerlix/Rolled [name] Dry [name] Heel Cup Wound #46 Right,Lateral Lower Leg Kerlix/Rolled [name] ABD pad Wound #47 Right,Anterior  Lower Leg Kerlix/Rolled [name] ABD pad Edema Control Elevate legs to the level of the heart or above for 30 minutes daily and/or when sitting, a frequency of: - throughout the day [facility] for wound care. - Interim Electronic Signature(s) Signed: 02/16/2019 5:46:17 PM By: Linton Ham MD Signed: 02/16/2019 5:59:11 PM By: [name] RN, BSN Entered By: [name] on 02/16/2019 13:14:33 -------------------------------------------------------------------------------- Problem List Details Patient Name: Date of Service: [name] 02/16/2019 1:30 PM Medical Record Number:[id number] Patient Account Number: [id number] Date of Birth/Sex: Treating RN: 16-Apr-1970 (48 y.o. [name] Primary Care Provider: Daisy [name] Other Clinician: Referring Provider: Treating Provider/Extender:[name], [name], [name] in Treatment: 23 Active Problems ICD-10 Evaluated Encounter Code Description Active Date Today Diagnosis M86.671 Other chronic osteomyelitis, right ankle and foot 09/03/2018 No Yes L97.514 Non-pressure chronic ulcer of other part of right foot 09/03/2018 No Yes with necrosis of bone E10.621 Type 1 diabetes mellitus with foot ulcer 09/24/2018 No Yes L02.415 Cutaneous abscess of right lower limb 12/25/2018 No Yes Inactive Problems Resolved Problems Electronic Signature(s) Signed: 02/16/2019 5:46:17 PM By: Linton Ham MD Entered By: Linton Ham on 02/16/2019 14:27:37 -------------------------------------------------------------------------------- Progress Note Details Patient Name: Date of Service: [name] 02/16/2019 1:30 PM Medical Record Number:[id number] Patient Account Number: [id number] Date of Birth/Sex: Treating RN: Jan 04, 1971 (48 y.o. [name] Primary Care Provider: Daisy [name] Other Clinician: Referring Provider: Treating Provider/Extender:[name], [name],  [name] in Treatment: 23 Subjective History of Present Illness (HPI) 48 year old diabetic who is known to have type 1 diabetes which is poorly controlled last hemoglobin A1c was 11%. She comes in with a ulcerated area on the left lateral foot which has been there for over 6 months. Was recently she has been treated by [name] of podiatry who  saw her last on 05/28/2016. Review of his notes revealed that the patient had incision and drainage with placement of antibiotic beads to the left foot on 04/11/2016 for possible osteomyelitis of the cuboid bone. Over the last year she's had a history of amputation of the left fifth toe and a femoropopliteal popliteal bypass graft somewhere in April 2017. 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 His note [name] mentions that the patient has been referred to me for further wound care and possibly great candidate for hyperbaric oxygen therapy due to recurrent osteomyelitis. However we do not have any x-rays of biopsy reports confirming this. He has been on several antibiotics including Bactrim and most recently is on doxycycline for an MRSA. I understand, the patient was not a candidate for IV antibiotics as she has had previous PICC lines which resulted in blood clots in both arms. There was a x-ray report dated 04/04/2016 on [name] notes which showed evidence of fifth ray resection left foot with osteolytic changes noted to the fourth metatarsal and cuboid bone on the left. 06/13/2016 -- had a left foot x-ray which showed no acute fracture or dislocation and no definite radiographic evidence of osteomyelitis. Advanced osteopenia was seen. 06/20/2016 -- she has noticed a new wound on the right plantar foot in the region where she had a callus before. 06/27/16- the patient did have her x-ray of the right foot which showed no findings to suggest osteomyelitis. She saw her endocrinologist, [name], yesterday. Her A1c in January was  11. He also indicates mismanagement and noncompliance regarding her diabetes. She is currently on Bactrim for a lip infection. She is complaining of nausea, vomiting and diarrhea. She is unable to articulate the exact orders or dosing of the Bactrim; it is unclear when she will complete this. 07/04/2016 -- results from [facility] of ABIs with ankle waveforms were noted from 02/14/2016. The examination done on 06/27/2015 showed noncompressible ABIs with the right being 1.45 and the left being 1.33. The present examination showed a right ABI of 1.19 on the left of 1.33. The conclusion was that right normal ABI in the lower extremity at rest however compared to previous study which was noncompressible ABI may be falsely elevated side suggesting medial calcification. The left ABI suggested medial calcification. 08/01/2016 -- the patient had more redness and pain on her right foot and did not get to come to see as noted she see her PCP or go to the ER and decided to take some leftover metronidazole which she had at home. As usual, the patient does report she feels and is rather noncompliant. 08/08/2016 -- -- x-ray of the right foot -- FINDINGS:Transmetatarsal amputation is noted. No bony destruction is noted to suggest osteomyelitis. IMPRESSION: No evidence of osteomyelitis. Postsurgical changes are seen. MRI would be more sensitive for possible bony changes. Culture has grown Serratia Marcescens -- sensitive to Bactrim, ciprofloxacin, ceftazidime she was seen by [name] on 08/06/2016. He did not find any exposed bone, muscle, tendon, ligament or joint. There was no malodor and he did a excisional debridement in the office. ============ Old notes: 48 year old patient who is known to the wound clinic for a while had been away from the wound clinic since 09/01/2014. Over the last several months she has been admitted to various hospitals including [place] at [facility]. She was treated  for a right metatarsal osteomyelitis with a transmetatarsal amputation and this was done about 2 months ago. He has a  small ulcerated area on the right heel and she continues to have an ulcerated area on the left plantar aspect of the foot. The patient was recently admitted to the [facility] hospital group between 7/12 and 10/18/2014. she was given 3 weeks of IV vancomycin and was to follow-up with her surgeons at [facility] and also took oral vancomycin for C. difficile colitis. 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 MRI of the left foot done on 10/14/2014 showed no abscess or osteomyelitis. 04/27/15; this is a patient we know from previous stays in the wound care center. She is a type I diabetic I am not sure of her control currently. Since the last time I saw her she is had a right transmetatarsal amputation and has no wounds on her right foot and has no open wounds. She is been followed at the wound care center at [facility] in [place]. She comes today with the desire to undergo hyperbaric treatment locally. Apparently one of her wound care providers in [place] has suggested hyperbarics. This is in response to an MRI from 04/18/15 that showed increased marrow signal and loss of the proximal fifth metatarsal cortex evidence of osteomyelitis with likely early osteomyelitis in the cuboid bone as well. She has a large wound over the base of the fifth metatarsal. She also has a eschar over her the tips of her toes on 1,3 and 5. She does not have peripheral pulses and apparently is going for an angiogram tomorrow which seems reasonable. After this she is going to infectious disease at [facility]. They have  been using Medihoney to the large wound on the lateral aspect of the left foot to. The patient has known Charcot deformity from diabetic neuropathy. She also has known diabetic PAD. Surprisingly I can't see that she has had any recent antibiotics, the patient states the last antibiotic she had was at the end of November for 10 days. I think this was in response to culture that showed group G strep although I'm not exactly sure where the culture was from. She is also had arterial studies on 03/29/15. This showed a right ABI of 1.4 that was noncompressible. Her left ABI was 0.73. 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 It was not felt that arterial inflow was adequate for healing of a foot ulcer. Her Doppler waveforms looked monophasic ===== READMISSION 02/28/17; this is in an now 48 year old woman we've had at several different occasions in this clinic. 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 She has a remote ex-smoker. She was last seen in this clinic by Dr. Con Memos I think in May. More recently she is been followed by her podiatrist [name] an infectious disease Dr. Megan Salon. She has 2 open wounds the major one is over the right first metatarsal head she also has a wound on the left plantar foot. an MRI of the right foot on 01/01/17 showed a soft tissue ulcer along the plantar aspect of the first metatarsal base consistent with osteomyelitis of the first metatarsal stump. Dr. 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 According to the patient this is been open for slightly over a month. She has been on a  combination of Cipro 500 twice a day, Zyvox 600 twice a day and Flagyl 500 3 times a day for over a month now as directed by Dr. Megan Salon. cultures of the right foot earlier this year showed MRSA in January and Serratia in May. January also had a few viridans  strep. Recent x-rays of both feet were done and [name] office and I don't have these reports. The patient has known PAD and has a history of aleft femoropopliteal bypass in April 2017. 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 Dwyane Dee. In spite of this the last hemoglobin A1c I can see is 10.1 on 01/01/2017. She is being referred by 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 She is been using Medihoney to this area and also an area on the left midfoot. She is using healing sandals bilaterally. ABIs in this clinic at the left posterior tibial was 1.1 noncompressible on the right READMISSION Non invasive vascular NOVANT 5/18 Aftercare following surgery of the circulatory system Procedure Note - Interface, External Ris In - 08/13/2016 11:05 AM EDT Procedure: Examination consists of physiologic resting arterial pressures of the brachial and ankle arteries bilaterally with continuous wave Doppler waveform analysis. Previous: Previous exam performed on 02/14/16 demonstrated ABIs of Rt = 1.19 and Lt = 1.33. Right: ABI = non-compressible PT, 1.47 DP. S/P transmet amputation. Left: ABI = 1.52, 2nd digit pressure = 87 mmHg Conclusions: Right: ABI (>1.3) may be falsely elevated, suggesting medial calcification. Left: ABI (>1.3) may be falsely elevated, suggesting medial calcification The patient is a now 48 year old type I diabetic is had multiple issues her graded to chronic diabetic foot ulcers. 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 We had her in the clinic lastin November. At that point she had wounds on her bilateral feet.she had wanted to try hyperbarics however the  healogics review process denied her because she hadn't followed up with her vascular surgeon for her left femoropopliteal bypass. The bypass was done by Dr. Raul Del at [facility]. We made her a follow-up with Dr. Raul Del however she did not keep the appointment and therefore she was not approved The patient shows me a small wound on her left fourth metatarsal head on her phone. She developed rapid discoloration in the plantar aspect of the left foot and she was admitted to hospital from 2/2 through 05/10/17 with wet gangrene of the left foot osteomyelitis of the fourth metatarsal heads. She was admitted acutely ill with a temperature of 103. She was started on broad-spectrum vancomycin and cefepime. On 05/06/17 she was taken to the OR by [name] her podiatric surgeon for an incision and drainage irrigation of the left foot wound. Cultures from this surgery revealed group be strep and anaerobes. she was seen by Dr.Xu of orthopedic surgery and scheduled for a below-knee amputation which she u refused. Ultimately she was discharged on Levaquin and Flagyl for one month. MRI 05/05/17 done while she was in the hospital showed abscess adjacent to the fourth metatarsal head and neck small abscess around the fourth flexor tendon. Inflammatory phlegmon and gas in the soft tissues along the lateral aspect of the fourth phalanx. Findings worrisome for osteomyelitis involving the fourth proximal and middle  phalanx and also the third and fourth metatarsals. Finally the patient had actually shortly before this followed up with Dr. Raul Del at no time on 04/29/17. He felt that her left femoropopliteal bypass was patent he felt that her left-sided toe pressures more than adequate for healing a wound on the left foot. This was before her acute presentation. 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 05/28/17; she is started hyperbarics. The patient tells me that for some reason she was not actually on Levaquin but I  think on ciprofloxacin. She was on Flagyl. She only started her Levaquin yesterday due to some difficulty with the pharmacy and perhaps her sister picking it up. She has an appointment with [name] tomorrow and with infectious disease early next week. She has no new complaints 06/06/17; the patient continues in hyperbarics. She saw [name] on 05/29/17 who is her podiatric surgeon. He is elected for a transmetatarsal amputation on 06/27/17. I'm not sure at what level he plans to do this amputation. The patient is unaware ooShe also saw Dr. Megan Salon of infectious disease who elected to continue her on current antibiotics I think this is ciprofloxacin and Flagyl. I'll need to clarify with her tomorrow if she actually has this. We're using silver alginate to the actual wound. Necrotic surface today with material under the flap of her foot. ooOriginal MRI showed abscesses as well as osteomyelitis of the proximal and middle fourth phalanx and the third and fourth metatarsal heads 06/11/17; patient continues in hyperbarics and continues on oral antibiotics. She is doing well. The wound looks better. The necrotic part of this under the flap in her superior foot also looks better. she is been to see [name]. I haven't had a chance to look at his note. Apparently he has put the transmetatarsal amputation on hold her request it is still planning to take her to the OR for debridement and product application ACEL. I'll see if I can find his note. I'll therefore leave product ordering/requests to [name] for now. I was going to look at Dermagraft 06/18/17-she is here in follow-up evaluation for bilateral foot wounds. She continues with hyperbaric therapy. She states she has been applying manuka honey to the right plantar foot and alternate manuka honey and silver alginate to the left foot, despite our orders. We will continue with same treatment plan and she will follo up next week. 06/25/17; I have reviewed  [name] last note from 3/11. She has operative debridement in 2 days' time. By review his note apparently they're going to place there is skin over the majority of this wound which is a good choice. She has a small satellite area at the most proximal part of this wound on the left plantar foot. The area on the right plantar foot we've been using silver alginate and it is close to healing. 07/02/17; unfortunately the patient was not easily approved for [name] proposed surgery. I'm not completely certain what the issue is. She has been using silver alginate to the wound she has completed a first course of hyperbarics. She is still on Levaquin and Flagyl. I have really lost track of the time course here.I suspect she should have another week to 2 of antibiotics. I'll need to see if she is followed up with infectious disease Dr. Megan Salon 07/09/17; the patient is followed up with Dr. Megan Salon. 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 She continues on Levaquin and metronidazole continuing  both of these for now I think she is been on fr about 6 weeks. She still has some drainage but no pain. No fever. Her had been plans for her to go to the OR for operative debridement with her podiatrist [name], I am not exactly sure where that is. I'll probably slip a note to [name] today. I note that she follows with Dr. Dwyane Dee of endocrinology. We have her recertified for hyperbaric oxygen. I have not heard about Dermagraft however I'll see if [name] is planning a skin substitute as well 07/16/17; the patient tells me she is just about out of [place]. I'll need to check Dr. Hale Bogus last notes on this. She states she has plenty of Flagyl however. She comes in today complaining of pain in the right lateral foot which she said lasted for about a day. The wound on the right foot is actually much more medially. She also tells me that the [facility] cost a lot of  pain in the left foot wound and she turned back to silver alginate. Finally Dermagraft has a $101 per application co-pay. She cannot afford this 07/23/17; patient arrives today with the wound not much smaller. There is not much new to add. She has not heard from [name] all try to put in a call to them today. She was asking about Dermagraft again and she has an over $751 per application co-pay she states that she would be willing to try to do a payment plan. I been tried to avoid this. We've been using silver alginate, I'll change to [facility] 07/30/17-She is here in follow-up evaluation for left foot ulcer. She continues hyperbaric medicine. The left foot ulcer is stable we will continue with same treatment plan 08/06/17; she is here for evaluation of her left foot ulcer. Currently being treated for hyperbarics or underlying osteomyelitis. She is completed antibiotics. The left foot ulcer is better smaller with healthier looking granulation. For various reasons I am not really clear on we never got her back to the OR with [name]. He did not respond to my secure text message. Nevertheless I think that surgery on this point is not necessary nor am I completely clear that a skin substitute is necessary The patient is complaining about pain on the outside of her right foot. 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 There is no erythema. She also states the foot is warm versus her other part of her upper leg and this is largely true. It is not totally clear to me what's causing this. She thinks it's different from her usual neuropathy pain 08/13/17; she arrives in clinic today with a small wound which is superficial on her right first metatarsal head. 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 She tells [place] she was up on her feet over the Mother's Day celebration. ooThe large wound is on the left foot. Continues with hyperbarics for underlying osteomyelitis. We're  using Hydrofera Blue. She asked me today about where we were with Dermagraft. I had actually excluded this because of the co- pay however she wants to assume this therefore I'll recheck the co-pay an order for next week. 08/20/17; the patient agreed to accept the co-pay of the first Apligraf which we applied today. She is disappointed she is finishing hyperbarics will run this through the insurance on the extent of the foot infection and the extent of the wound that she had however she is already had 60 dive's. Dermagraft  No. 1 08/27/17; Dermagraft No. 2. She is not eligible for any more hyperbaric treatments this month. She reports a fair amount of drainage and she actually changed to the external dressings without disturbing the direct contact layer 09/03/17; the patient arrived in clinic today with the wound superficially looking quite healthy. Nice vibrant red tissue with some advancing epithelialization although not as much adherence of the flap as I might like. However she noted on her own fourth toe some bogginess and she brought that to our attention. Indeed this was boggy feeling like a possibility of subcutaneous fluid. 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 She is not been unwell. We've been using Dermagraft 09/10/17; the culture that I did not last week was MRSA. She saw Dr. Megan Salon this morning who is going to start her on vancomycin. I had sent him a secure a text message yesterday. I also spoke with her podiatric surgeon [name] about surgery on this foot the options for conserving a functional foot etc. Promised me he would see her and will make back consultation today. Paradoxically her actual wound on the plantar aspect of her left foot looks really quite good. I had given her 5 days worth of Baxdella to cover her for MRSA. Her MRI came back showing osteomyelitis within the third metatarsal shaft and head  and base of the third and fourth proximal phalanx. She had extensive inflammatory changes throughout the soft tissue of the lateral forefoot. With an ill-defined fluid around the fourth metatarsal extending into the plantar and dorsal soft tissues 09/19/17; the patient is actually on oral Septra and Flagyl. She apparently refused IV vancomycin. She also saw [name] at my request who is planning her for a left BKA sometime in mid July. MRI showed osteomyelitis within the third metatarsal shaft and head and the basis of the third and fourth proximal phalanx. I believe there was felt to be possible septic arthritis involving the third MTP. 09/26/17; the patient went back to Dr. Megan Salon at my suggestion and is now receiving IV daptomycin. Her wound continues to look quite good making the decision to proceed with a transmetatarsal amputation although more difficult for the patient. I believe in my extensive discussions with her she has a good sense of the pros and cons of this. I don't NV the tuft decision she has to make. She has an appointment with [name] I believe in mid July and I previously spoken to him about this issue Has we had used 3 previous Dermagraft. Given the condition of the wound surface I went ahead and added the fourth one today area and I did this not fully realizing that she'll be traveling to [place] next week. I'm hopeful she can come back in 2 weeks 10/21/17; Her same Dermagraft on for about 3-1/2 weeks. In spite of this the wound arrives looking quite healthy. There is been a lot of healing dimensions are smaller. Looking at the square shaped wound she has now there is some undermining and some depth medially under the undermining although I cannot palpate any bone. No surrounding infection is obvious. She has difficult questions about how to look at this going forward vis--vis amputations versus continued medical therapy. To be truthful the wound is looks so healthy and  it is continued to contract. Hard to justify foot surgery at this point although I still told her that I think  it might come to that if we are not able to eradicate the underlying MRSA. She is still highly at risk and she understands this 11/06/17 on evaluation today patient appears to be doing better in regard to her foot ulcer. She's been tolerating the dressing changes without complication. Currently she is here for her Dermagraft #6. Her wound continues to make excellent progress at this point. She does not appear to have any evidence of infection which is good news. 11/13/17 on evaluation today patient appears to be doing excellent at this time. She is here for repeat Dermagraft application. This is #7. Overall her wound seems to be making great progress. 12/05/17; the patient arrives with the wound in much better condition than when I last saw this almost 6 weeks ago. She still has a small probing area in the left metatarsal head region on the lateral aspect of her foot. We applied her last Dermagraft today. ooSince the last time she is here she has what appears to a been a blood blister on the plantar aspect of left foot although I don't see this is threatening. There is also a thick raised tissue on the right mid metatarsal head region. This was not there I don't think the last time she was here 3 weeks ago. 12/12/17; the patient continues to have a small programming area in the left metatarsal head region on the lateral aspect of her foot which was the initial large surgical wound. I applied her last Apligraf last week. I'm going to use Endoform starting today ooUnfortunately she has an excoriated area in the left mid foot and the right mid foot. The left midfoot looks like a blistered area this was not opened last week it certainly is open today. Using silver alginate on these areas. She promises me she is offloading this. 12/19/17; the small probing area in the left metatarsal head eyes  think is shallower. In general her original wound looks better. We've been using Endoform. The area inferiorly that I think was trauma last week still requires debridement a lot of nonviable surface which I removed. She still has an open open area distally in her foot ooSimilarly on the right foot there is tightly adherent surface debris which I removed. Still areas that don't look completely epithelialized. This is a small open area. We used silver alginate on these areas 12/26/2017; the patient did not have the supplies we ordered from last week including the Endoform. The original large wound on the left lateral foot looks healthy. She still has the undermining area that is largely unchanged from last week. She has the same heavily callused raised edged wounds on the right mid and left midfoot. Both of these requiring debridement. We have been using silver alginate on these areas 01/02/2018; there is still supply issues. We are going to try to use Prisma but I am not sure she actually got it from what she is saying. She has a new open area on the lateral aspect of the left fourth toe [previous fifth ray amputation]. Still the one tunneling area over the fourth metatarsal head. The area is in the midfoot bilaterally still have thick callus around them. She is concerned about a raised swelling on the lateral aspect of the foot. However she is completely insensate 01/10/2018; we are using Prisma to the wounds on her bilateral feet. Surprisingly the tunneling area over the left fourth metatarsal head that was part of her original surgery has closed down. She has a small  open area remaining on the incision line. 2 open areas in the midfoot. 02/10/2018; the patient arrives back in clinic after a month hiatus. She was traveling to visit family in [place]. Is fairly clear she was not offloading the areas on her feet. The original wound over the left lateral foot at the level of metatarsal heads is  reopened and probes medially by about a centimeter or 2. She notes that a week ago she had purulent drainage come out of an area on the left midfoot. Paradoxically the worst area is actually on the right foot is extensive with purulent drainage. We will use silver alginate today 02/17/2018; the patient has 3 wounds one over the left lateral foot. She still has a small area over the metatarsal heads which is the remnant of her original surgical wound. This has medial probing depth of roughly 1.4 cm somewhat better than last week. The area on the right foot is larger. We have been using silver alginate to all areas. The area on the right foot and left foot that we cultured last week showed both Klebsiella and Proteus. Both of these are quinolone sensitive. The patient put her's self on Bactrim and Flagyl that she had left hanging around from prior antibiotic usages. She was apparently on this last week when she arrived. I did not realize this. Unfortunately the Bactrim will not cover either 1 of these organisms. We will send in Cipro 500 twice daily for a week 03/04/2018; the patient has 2 wounds on the left foot one is the original wound which was a surgical wound for a deep DFU. At one point this had exposed bone. She still has an area over the fourth metatarsal head that probes about 1.4 cm although I think this is better than last week. I been using silver nitrate to try and promote tissue adherence and been using silver alginate here. ooShe also has an area in the left midfoot. This has some depth but a small linear wound. Still requiring debridement. ooOn the right midfoot is a circular wound. A lot of thick callus around this area. ooWe have been using silver alginate to all wound areas ooShe is completed the ciprofloxacin I gave her 2 weeks ago. 03/11/2018; the patient continues to have 2 open areas on the left foot 1 of which was the original surgical wound for a deep DFU. Only a small  probing area remains although this is not much different from last week we have been using silver alginate. The other area is on the midfoot this is smaller linear but still with some depth. We have been using silver alginate here as well ooOn the right foot she has a small circular wound in the mid aspect. This is not much smaller than last time. We have been using silver alginate here as well 03/18/2018; she has 3 wounds on the left foot the original surgical wound, a very superficial wound in the mid aspect and then finally the area in the mid plantar foot. She arrives in today with a very concerning area in the wound in the mid plantar foot which is her most proximal wound. There is undermining here of roughly 1-1/2 cm superiorly. Serosanguineous drainage. She tells me she had some pain on for over the weekend that shot up her foot into her thigh and she tells me that she had a nodule in the groin area. ooShe has the single wound in the right foot. ooWe are using endoform to both wound  areas 03/24/2018; the patient arrives with the original surgical wound in the area on the left midfoot about the same as last week. There is a collection of fluid under the surface of the skin extending from the surgical wound towards the midfoot although it does not reach the midfoot wound. The area on the right foot is about the same. Cultures from last week of the left midfoot wound showed abundant Klebsiella abundant Enterococcus faecalis and moderate methicillin resistant staph I gave her Levaquin but this would have only covered the Klebsiella. She will need linezolid 04/01/2018; she is taking linezolid but for the first few days only took 1 a day. I have advised her to finish this at twice daily dosing. In any case all of her wounds are a lot better especially on the left foot. The original surgical wound is closed. The area on the left midfoot considerably smaller. The area on the right foot also  smaller. 04/08/2018; her original surgical wound/osteomyelitis on the left foot remains closed. She has area on the left foot that is in the midfoot area but she had some streaking towards this. This is not connected with her original wound at least not visually. ooSmall wound on the right midfoot appears somewhat smaller. 04/15/18; both wounds looks better. Original wound is better left midfoot. Using silver alginate 1/21; patient states she uses saltwater soak in, stones or remove callus from around her wounds. She is also concerned about a blood blister she had on the left foot but it simply resolved on its own. We've been using silver alginate 1/28; the patient arrives today with the same streaking area from her metatarsals laterally [the site of her original surgical wound] down to the middle of her foot. There is some drainage in the subcutaneous area here. This concerns me that there is actually continued ongoing infection in the metatarsals probably the fourth and third. This fixates an MRI of the foot without contrast [chronic renal failure] ooThe wound in the mid part of the foot is small but I wonder whether this area actually connects with the more distal foot. ooThe area on the right midfoot is probably about the same. Callus thick skin around the small wound which I removed with a curette we have been using silver alginate on both wound areas 2/4; culture I did of the draining site on the left foot last time grew methicillin sensitive staph aureus. MRI of the left foot showed interval resolution of the findings surrounding the third metatarsal joint on the prior study consistent with treated osteomyelitis. Chronic soft tissue ulceration in the plantar and lateral aspect of the forefoot without residual focal fluid collection. No evidence of recurrent osteomyelitis. Noted to have the previous amputation of the distal first phalanx and fifth ray MRI of the right foot showed no  evidence of osteomyelitis I am going to treat the patient with a prolonged course of antibiotics directed against MSSA in the left foot 2/11; patient continues on cephalexin. She tells me she had nausea and vomiting over the weekend and missed 2 days. In general her foot looks much the same. She has a small open area just below the left fourth metatarsal head. A linear area in the left midfoot. Some discoloration extending from the inferior part of this into the left lateral foot although this appears to be superficial. She has a small area on the right midfoot which generally looks smaller after debridement 2/18; the patient is completing his cephalexin and has another  2 days. She continues to have open areas on the left and right foot. 2/25; she is now off antibiotics. The area on the left foot at the site of her original surgical wound has closed yet again. She still has open areas in the mid part of her foot however these appear smaller. The area on the right mid foot looks about the same. We have been using silver alginate She tells me she had a serious hypoglycemic spell at home. She had to have EMS called and get IV dextrose 3/3; disappointing on the left lateral foot large area of necrotic tissue surrounding the linear area. This appears to track up towards the same original surgical wound. Required extensive debridement. The area on the right plantar foot is not a lot better also using silver 3/12; the culture I did last time showed abundant enterococcus. I have prescribed Augmentin, should cover any unrecognized anaerobes as well. In addition there were a few MRSA and Serratia that would not be well covered although I did not want to give her multiple antibiotics. She comes in today with a new wound in the right midfoot this is not connected with the original wound over her MTP a lot of thick callus tissue around both wounds but once again she said she is not walking on these  areas 3/17-Patient comes in for follow-up on the bilateral plantar wounds, the right midfoot and the left plantar wound. Both these are heavily callused surrounding the wounds. We are continuing to use silver alginate, she is compliant with offloading and states she uses a wheelchair fairly often at home 3/24; both wound areas have thick callus. However things actually look quite a bit better here for the majority of her left foot and the right foot. 3/31; patient continues to have thick callused somewhat irritated looking tissue around the wounds which individually are fairly superficial. There is no evidence of surrounding infection. We have been using silver alginate however I change that to [facility] today 4/17; patient returns to clinic after having a scare with Covid she tested negative in her primary doctor's office. She has been using Hydrofera Blue. She does not have an open area on the right foot. On the left foot she has a small open area with the mid area not completely viable. She showed me pictures of what looks like a hemorrhagic blister from several days ago but that seems to have healed over this was on the lateral left foot 4/21; patient comes in to clinic with both her wounds on her feet closed. However over the weekend she started having pain in her right foot and leg up into the thigh. She felt as though she was running a low-grade fever but did not take her temperature. She took a doxycycline that she had leftover and yesterday a single Septra and metronidazole. She thinks things feel somewhat better. 4/28; duplex ultrasound I ordered last week was negative for DVT or superficial thrombophlebitis. She is completed the doxycycline I gave her. States she is still having a lot of pain in the right calf and right ankle which is no better than last week. She cannot sleep. She also states she has a temperature of up to 101, coughing and complaining of visual loss in her  bilateral eyes. Apparently she was tested for Covid 2 weeks ago at [facility] and that was negative. Readmission: 09/03/18 patient presents back for reevaluation after having been evaluated at the end of April regarding erythema and swelling of  her right lower extremity. Subsequently she ended up going to the hospital on 07/29/18 and was admitted not to be discharged until 08/08/18. Unfortunately it was noted during the time that she was in the hospital that she did have methicillin-resistant Staphylococcus aureus as the infection noted at the site. It was also determined that she did have osteomyelitis which appears to be fairly significant. She was treated with vancomycin and in fact is still on IV vancomycin at dialysis currently. This is actually slated to continue until 09/12/18 at least which will be the completion of the six weeks of therapy. Nonetheless based on what I'm seeing at this point I'm not sure she will be anywhere near ready to discontinue antibiotics at that time. Since she was released from the hospital she was seen by [name] who is her podiatrist on 08/27/18. His note specifically states that he is recommended that the patient needs of one knee amputation on the right as she has a life-threatening situation that can lead quickly to sepsis. The patient advised she would like to try to save her leg to which [name] apparently told her that this was against all medical advice. She also want to discontinue the Wound VAC which had been initiated due to the fact that she wasn't pleased with how the wound was looking and subsequently she wanted to pursue applying Medihoney at that time. He stated that he did not believe that the right lower extremity was salvageable and that the patient understood but would still like to attempt hyperbaric option therapy if it could be of any benefit. She was therefore referred back to [place] for further evaluation. He plans to see her back next week. Upon  inspection today patient has a significant amount purulent drainage noted from the wound at this point. The bone in the distal portion of her foot also appears to be extremely necrotic and spongy. When I push down on the bone it bubbles and seeps purulent drainage from deeper in the end of the foot. I do not think that this is likely going to heal very well at all and less aggressive surgical debridement were undertaken more than what I believe we can likely do here in our office. 09/12/2018; I have not seen this patient since the most recent hospitalization although she was in our clinic last week. I have reviewed some of her records from a complex hospitalization. 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 There is situation was complicated by MRSA bacteremia and acute on chronic renal failure now on dialysis. She is receiving vancomycin at dialysis. We started her on Dakin's wet-to- dry last week she is changing this daily. There is still purulent drainage coming out of her foot. Although she is apparently "agreeable" to a below-knee amputation which is been suggested by multiple clinicians she wants this to be done in [place]. She apparently has a telehealth visit with that provider sometime in late [place] 6/24. I have told her I think this is probably too long. Nevertheless I could not convince her to allow a local doctor to perform BKA. 09/19/2018; the patient has a large necrotic area on the right anterior foot. She has had previous transmetatarsal amputations. Culture I did last week showed MRSA nothing else she is on vancomycin at dialysis. She has continued leaking purulent drainage out of the distal part of the large circular wound on the right anterior foot. She apparently went to see Dr.  Graves of orthopedics to discuss scheduling of her below-knee amputation. Somehow that translated into her being referred to plastic surgery for  debridement of the area. I gather she basically refused amputation although I do not have a copy of Dr. Berenice Primas notes. The patient really wants to have a trial of hyperbaric oxygen. I agreed with initial assessment in this clinic that this was probably too far along to benefit however if she is going to have plastic surgery I think she would benefit from ancillary hyperbaric oxygen. The issue here is that the patient has benefited as maximally as any patient I have ever seen from hyperbaric oxygen therapy. Most recently she had exposed bone on the lateral part of her left foot after a surgical procedure and that actually has closed. She has eschared areas in both heels but no open area. She is remained systemically well. I am not optimistic that anything can be done about this but the patient is very clear that she wants an attempt. The attempt would include a wound VAC further debridements and hyperbaric oxygen along with IV antibiotics. 6/26; I put her in for a trial of hyperbaric oxygen only because of the dramatic response she has had with wounds on her left midfoot earlier this year which was a surgical wound that went straight to her bone over the metatarsal heads and also remotely the left third toe. We will see if we can get this through our review process and insurance. She arrives in clinic with again purulent material pouring out of necrotic bone on the top of the foot distally. There is also some concerning erythema on the front of the leg that we marked. It is bit difficult to tell how tender this is because of neuropathy. I note from infectious disease that she had her vancomycin extended. 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 She had the wound VAC on for part of the week. The rest of the time she is putting various things on this including Medihoney, "ionized water" silver sorb gel etc. 7/7; follow-up along with HBO. She is still on vancomycin at  dialysis. She has a large open area on the dorsal right foot and a small dark eschar area on her heel. There is a lot less erythema in the area and a lot less tenderness. From an infection point of view I think this is better. She still has a lot of necrosis in the remaining right forefoot [previous TMA] we are still using the wound VAC in this area 7/16; follow-up along with HBO. I put her on linezolid after she finished her vancomycin. We started this last Friday I gave her 2 weeks worth. I had the expectation that she would be operatively debrided by Dr. Marla Roe but that still has not happened yet. Patient phoned the office this week. She arrives for review today after HBO. The distal part of this wound is completely necrotic. Nonviable pieces of tendon bone was still purulent drainage. Also concerning that she has black eschar over the heel that is expanding. I think this may be indicative of infection in this area as well. She has less erythema and warmth in the ankle and calf but still an abnormal exam 7/21 follow-up along with HBO. I will renew her linezolid after checking a CBC with differential monitoring her blood counts especially her platelets. She was supposed to have surgery yesterday but if I am reading things correctly this was canceled after her blood sugar  was found to be over 500. I thought Dr. Marla Roe who called me said that they were sending her to the ER but the patient states that was not the case. 7/28. Follow-up along with HBO. She is on linezolid I still do not have any lab work from dialysis even though I called last week. The patient is concerned about an area on her left lateral foot about the level of the base of her fifth metatarsal. I did not really see anything that ominous here however this patient is in [place] ability to point out problems that she is sensing and she has been accurate in the past Finally she received a call from Dr. Marla Roe who is  referring her to another orthopedic surgeon stating that she is too booked up to take her to the operating room now. Was still using a wound VAC on the foot 8/3 -Follow-up after HBO, she is got another week of linezolid, she is to call ID for an appointment, x-rays of both feet were reviewed, the left foot x-ray with third MTP joint osteo- Right foot x-ray widespread osteo-in the right midfoot Right ankle x-ray does not show any active evidence of infection 8/11-Patient is seen after HBO, the wounds on the right foot appear to be about the same, the heel wound had some necrotic base over tendon that was debrided with a curette 8/21; patient is seen after HBO. The patient's wound on her dorsal foot actually looks reasonably good and there is substantial amount of epithelialization however the open area distally still has a lot of necrotic debris partially bone. I cannot really get a good sense of just how deep this probes under the foot. She has been pressuring me this week to order medical maggots through a company in [place] for her. The problem I have is there is not a defined wound area here. On the positive side there is no purulence. She has been to see infectious disease she is still on Septra DS although I have not had a chance to review their notes 8/28; patient is seen in conjunction with HBO. The wounds on her foot continued to improve including the right dorsal foot substantially the, the distal part of this wound and the area on the right heel. We have been using a wound VAC over this chronically. She is still on trimethoprim as directed by infectious disease 9/4; patient is seen in conjunction with HBO. Right dorsal foot wound substantially anteriorly is better however she continues to have a deep wound in the distal part of this that is not responding. We have been using silver collagen under border foam ooArea on the right plantar medial heel seems better. We have been using  Hydrofera Blue 12/12/18 on evaluation today patient appears to be doing about the same with regard to her wound based on prior measurements. She does have some necrotic tissue noted on the lateral aspect of the wound that is going require a little bit of sharp debridement today. This includes what appears to be potentially either severely necrotic bone or tendon. Nonetheless other than that she does not appear to have any severe infection which is good news 9/18; it is been 2 weeks since I saw this wound. She is tolerating HBO well. Continued dramatic improvement in the area on the right dorsal foot. She still has a small wound on the heel that we have been using Hydrofera Blue. She continues with a wound VAC 9/24; patient has to be seen emergently  today with a swelling on her right lateral lower leg. She says that she told Dr. Evette Doffing about this and also myself on a couple of occasions but I really have no recollection of this. She is not systemically unwell and her wound really looked good the last time I saw this. She showed this to providers at dialysis and she was able to verify that she was started on cephalexin today for 5 doses at dialysis. She dialyzes on Tuesday Thursday and Saturday. 10/2; patient is seen in conjunction with HBO. The area that is draining on the right anterior medial tibia is more extensive. Copious amounts of serosanguineous drainage with some purulence. We are still using the wound VAC on the original wound then it is stable. Culture I did of the original IandD showed MRSA I contacted dialysis she is now on vancomycin with dialysis treatments. I asked them to run a month 10/9; patient seen in conjunction with HBO. She had a new spontaneous open area just above the wound on the right medial tibia ankle. More swelling on the right medial tibia. Her wound on the foot looks about the same perhaps slightly better. There is no warmth spreading up her leg but no obvious  erythema. her MRI of the foot and ankle and distal tib-fib is not booked for next Friday I discussed this with her in great detail over multiple days. it is likely she has spreading infection upper leg at least involving the distal 25% above the ankle. She knows that if I refer her to orthopedics for infectious disease they are going to recommend amputation and indeed I am not against this myself. We had a good trial at trying to heal the foot which is what she wanted along with antibiotics debridement and HBO however she clearly has spreading infection [probably staph aureus/MRSA]. Nevertheless she once again tells me she wants to wait the left of the MRI. She still makes comments about having her amputation done in [place]. 10/19; arrives today with significant swelling on the lateral right leg. Last culture I did showed Klebsiella. Multidrug- resistant. Cipro was intermediate sensitivity and that is what I have her on pending her MRI which apparently is going to be done on Thursday this week although this seems to be moving back and forth. She is not systemically unwell. We are using silver alginate on her major wound area on the right medial foot and the draining areas on the right lateral lower leg 10/26; MRI showed extensive abscess in the anterior compartment of the right leg also widespread osteomyelitis involving osseous structures of the midfoot and portions of the hindfoot. Also suspicion for osteomyelitis anterior aspect of the distal medial malleolus. Culture I did of the purulence once again showed a multidrug-resistant Klebsiella. I have been in contact with nephrology late last week and she has been started on cefepime at dialysis to replace the vancomycin We sent a copy of her MRI report to Dr. Geroge Baseman in [place] who is an orthopedic surgeon. The patient takes great stock in his opinion on this. She says she will go to [place] to have her leg amputated if Dr. Geroge Baseman does  not feel there is any salvage options. 11/2; she still is not talk to her orthopedic surgeon in [place]. Apparently he will call her at [phone] this afternoon. The quality of this is she has not allowed me to refer her anywhere. She has been told over and over that she needs this amputated but has not agreed  to be referred. She tells me her blood sugar was 600 last night but she has not been febrile. 11/9; she never did got a call from the orthopedic surgeon in [place] therefore that is off the radar. We have arranged to get her see orthopedic surgery at [facility]. She still has a lot of draining purulence coming out of the new abscess in her right leg although that probably came from the osteomyelitis in her right foot and heel. Meanwhile the original wound on the right foot looks very healthy. Continued improvement. The issue is that the last MRI showed osteomyelitis in her right foot extensively she now has an abscess in the right anterior lower leg. There is nobody in [place] who will offer this woman anything but an amputation and to be honest that is probably what she needs. I think she still wants to talk about limb salvage although at this point I just do not see that. She has completed her vancomycin at dialysis which was for the original staph aureus she is still on cefepime for the more recent Klebsiella. She has had a long course of both of these antibiotics which should have benefited the osteomyelitis on the right foot as well as the abscess. 11/16; apparently [place] elective surgery is shut down because of COVID-19 pandemic. I have reached out to some contacts at [facility] to see if we can get her an orthopedic appointment there. I am concerned about continually leaving this but for the moment everything is static. In fact her original large wound on this foot is closing down. It is the abscess on the right anterior leg that continues to drain purulent serosanguineous  material. She is not currently on any antibiotics however she had a prolonged course of vancomycin [1 month] as well as cefepime for a month Objective Constitutional Patient is hypertensive.. Pulse regular and within target range for patient.Marland Kitchen Respirations regular, non-labored and within target range.. Temperature is normal and within the target range for the patient.Marland Kitchen Appears in no distress. Vitals Time Taken: 1:25 PM, Height: 67 in, Weight: 125 lbs, BMI: 19.6, Temperature: 98.7 F, Pulse: 93 bpm, Respiratory Rate: 17 breaths/min, Blood Pressure: 167/76 mmHg, Capillary Blood Glucose: 149 mg/dl. General Notes: patient reported CBG of 149 this morning Respiratory work of breathing is normal. Cardiovascular It is difficult to feel her pulse in the right foot because of the position of the wounds but I think if the dorsalis pedis is palpable. Lymphatic None palpable in the popliteal area on the right. Psychiatric appears at normal baseline. General Notes: Wound exam; the original wound on the medial part of her foot looks really quite good and is contracting continuously. However the necrotic area on the right anterior lower leg still is about the same draining serosanguineous purulent material. There is a wide amount of undermining in the small IandD site I did to let this drain. Integumentary (Hair, Skin) Wound #43 status is Open. Original cause of wound was Gradually Appeared. The wound is located on the Right,Medial Foot. The wound measures 5.8cm length x 3.5cm width x 1.6cm depth; 15.944cm^2 area and 25.51cm^3 volume. There is muscle, tendon, and Fat Layer (Subcutaneous Tissue) Exposed exposed. There is no tunneling or undermining noted. There is a large amount of purulent drainage noted. The wound margin is well defined and not attached to the wound base. There is medium (34-66%) pink granulation within the wound bed. There is a medium (34-66%) amount of necrotic tissue within the  wound bed including Adherent  Slough and Necrosis of Muscle. Wound #44 status is Open. Original cause of wound was Gradually Appeared. The wound is located on the Right Calcaneus. The wound measures 1.1cm length x 1.1cm width x 0.1cm depth; 0.95cm^2 area and 0.095cm^3 volume. There is Fat Layer (Subcutaneous Tissue) Exposed exposed. There is no tunneling or undermining noted. There is a none present amount of drainage noted. The wound margin is flat and intact. There is medium (34-66%) pink granulation within the wound bed. There is a medium (34-66%) amount of necrotic tissue within the wound bed including Eschar. Wound #46 status is Open. Original cause of wound was Other Lesion. The wound is located on the Right,Lateral Lower Leg. The wound measures 1.2cm length x 1.5cm width x 2.5cm depth; 1.414cm^2 area and 3.534cm^3 volume. There is Fat Layer (Subcutaneous Tissue) Exposed exposed. There is no tunneling noted, however, there is undermining starting at 9:00 and ending at 7:00 with a maximum distance of 4.7cm. There is a large amount of purulent drainage noted. The wound margin is well defined and not attached to the wound base. There is medium (34-66%) red, pink granulation within the wound bed. There is a medium (34-66%) amount of necrotic tissue within the wound bed including Adherent Slough. Wound #47 status is Open. Original cause of wound was Gradually Appeared. The wound is located on the Right,Anterior Lower Leg. The wound measures 0.5cm length x 0.7cm width x 0.1cm depth; 0.275cm^2 area and 0.027cm^3 volume. There is Fat Layer (Subcutaneous Tissue) Exposed exposed. There is no tunneling or undermining noted. There is a none present amount of drainage noted. The wound margin is distinct with the outline attached to the wound base. There is large (67-100%) pink granulation within the wound bed. There is no necrotic tissue within the wound bed. 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 Dressing Change Frequency: Wound #43 Right,Medial Foot: Change dressing three times week. Wound #44 Right Calcaneus: Change dressing three times week. Wound #46 Right,Lateral Lower Leg: Change dressing three times week. Wound #47 Right,Anterior Lower Leg: Change dressing three times week. Wound Cleansing: Clean wound with Wound Cleanser - Anasept to all wounds Primary Wound Dressing: Wound #43 Right,Medial Foot: Calcium Alginate with Silver Wound #44 Right Calcaneus: Calcium Alginate with Silver Wound #46 Right,Lateral Lower Leg: Calcium Alginate with Silver - do not pack, place over wound bed Wound #47 Right,Anterior Lower Leg: Calcium Alginate with Silver Secondary Dressing: Wound #43 Right,Medial Foot: Kerlix/Rolled [name] ABD pad Wound #44 Right Calcaneus: Kerlix/Rolled [name] Dry [name] Heel Cup Wound #46 Right,Lateral Lower Leg: Kerlix/Rolled [name] ABD pad Wound #47 Right,Anterior Lower Leg: Kerlix/Rolled [name] ABD pad Edema Control: Elevate legs to the level of the heart or above for 30 minutes daily and/or when sitting, a frequency of: - throughout the day Home Health: [facility] skilled nursing for wound care. - Interim . #1 we continue with silver alginate to all wound areas 2. She continues to place various things on the abscess site on her right leg. 3. I am trying to get an name of an orthopedic surgeon who might give her some time to listen to what she is trying to do. In particular she just wants somebody to clean out the abscess rather than giving her the alternative ultimatum of an amputation.  4. She is concerned that any surgeon in [place] would just want to amputate her leg and that  is probably true although I have told her many times that this would not be a bad decision Electronic Signature(s) Signed: 02/16/2019 5:46:17 PM By: Linton Ham MD Entered By: Linton Ham on 02/16/2019 14:32:17 -------------------------------------------------------------------------------- SuperBill Details Patient Name: Date of Service: [name] 02/16/2019 Medical Record Number:[id number] Patient Account Number: [id number] Date of Birth/Sex: Treating RN: 02-Feb-1971 (48 y.o. [name] Primary Care Provider: Daisy [name] Other Clinician: Referring Provider: Treating Provider/Extender:[name], [name], [name] in Treatment: 23 Diagnosis Coding ICD-10 Codes Code Description [phone] Other chronic osteomyelitis, right ankle and foot L97.514 Non-pressure chronic ulcer of other part of right foot with necrosis of bone E10.621 Type 1 diabetes mellitus with foot ulcer L02.415 Cutaneous abscess of right lower limb Facility Procedures CPT4 Code: [id number] Description: 99214 - WOUND CARE VISIT-LEV 4 EST PT Modifier: Quantity: 1 Physician Procedures CPT4 Code Description: [id number] - WC PHYS LEVEL 3 - EST PT ICD-10 Diagnosis Description L97.514 Non-pressure chronic ulcer of other part of right foot M86.671 Other chronic osteomyelitis, right ankle and foot L02.415 Cutaneous abscess of right lower  limb E10.621 Type 1 diabetes mellitus with foot ulcer Modifier: with necrosis of Quantity: 1 bone Electronic Signature(s) Signed: 02/16/2019 5:46:17 PM By: Linton Ham MD Entered By: Linton Ham on 02/16/2019 14:32:50

## 2019-02-17 NOTE — Progress Notes (Signed)
Nancy, [name] ([id number]) Visit Report for 07/01/2018 Debridement Details Patient Name: Date of Service: [name], [name] 07/01/2018 12:30 PM Medical Record Number:[id number] Patient Account Number: [id number] Date of Birth/Sex: Treating RN: 1970-11-20 (48 y.o. [name] Primary Care Provider: Daisy [name] Other Clinician: Sandre [name] Referring Provider: Treating Provider/Extender:[name], [name], [name] Weeks in Treatment: 59 Debridement Performed for Wound #37 Right,Plantar Foot Assessment: Performed By: Physician [name]., MD Debridement Type: Debridement Severity of Tissue Pre Fat layer exposed Debridement: Level of Consciousness (Pre- Awake and Alert procedure): Pre-procedure Verification/Time Out Taken: Yes - 11:33 Start Time: 11:33 Pain Control: Lidocaine 5% topical ointment Total Area Debrided (L x W): 0.5 (cm) x 0.5 (cm) = 0.25 (cm) Tissue and other material Viable, Non-Viable, Slough, Subcutaneous, Skin: Dermis , Skin: Epidermis, Slough debrided: Level: Skin/Subcutaneous Tissue Debridement Description: Excisional Instrument: Curette Bleeding: Moderate Hemostasis Achieved: Silver Nitrate End Time: 11:39 Procedural Pain: 0 Post Procedural Pain: 0 Response to Treatment: Procedure was tolerated well Level of Consciousness Awake and Alert (Post-procedure): Post Debridement Measurements of Total Wound Length: (cm) 0.5 Width: (cm) 0.5 Depth: (cm) 0.3 Volume: (cm) 0.059 Character of Wound/Ulcer Post Improved Debridement: Severity of Tissue Post Debridement: Fat layer exposed Post Procedure Diagnosis Same as Pre-procedure Electronic Signature(s) Signed: 07/01/2018 2:21:53 PM By: Nancy Ham MD Signed: 08/19/2018 1:55:11 PM By: Nancy Coria RN Entered By: [name] on 07/01/2018 14:15:01 -------------------------------------------------------------------------------- Debridement Details Patient Name: Date of  Service: [name]. 07/01/2018 12:30 PM Medical Record Number:[id number] Patient Account Number: [id number] Date of Birth/Sex: Treating RN: [birth year] (48 y.o. [name] Primary Care Provider: Daisy [name] Other Clinician: Sandre [name] Referring Provider: Treating Provider/Extender:[name], [name], [name] Weeks in Treatment: 59 Debridement Performed for Wound #41 Left,Proximal,Plantar Foot Assessment: Performed By: Physician [name]., MD Debridement Type: Debridement Severity of Tissue Pre Fat layer exposed Debridement: Level of Consciousness (Pre- Awake and Alert procedure): Pre-procedure Yes - 11:33 Verification/Time Out Taken: Start Time: 11:33 Pain Control: Lidocaine 5% topical ointment Total Area Debrided (L x W): 0.3 (cm) x 0.2 (cm) = 0.06 (cm) Tissue and other material Viable, Non-Viable, Slough, Subcutaneous, Skin: Dermis , Skin: Epidermis, Slough debrided: Level: Skin/Subcutaneous Tissue Debridement Description: Excisional Instrument: Curette Bleeding: Moderate Hemostasis Achieved: Silver Nitrate End Time: 11:39 Procedural Pain: 0 Post Procedural Pain: 0 Response to Treatment: Procedure was tolerated well Level of Consciousness Awake and Alert (Post-procedure): Post Debridement Measurements of Total Wound Length: (cm) 0.3 Width: (cm) 0.2 Depth: (cm) 0.1 Volume: (cm) 0.005 Character of Wound/Ulcer Post Improved Debridement: Severity of Tissue Post Debridement: Fat layer exposed Post Procedure Diagnosis Same as Pre-procedure Electronic Signature(s) Signed: 07/01/2018 2:21:53 PM By: Nancy Ham MD Signed: 08/19/2018 1:55:11 PM By: Nancy Coria RN Entered By: [name] on 07/01/2018 14:15:11 -------------------------------------------------------------------------------- HPI Details Patient Name: Date of Service: [name]. 07/01/2018 12:30 PM Medical Record Number:[id number] Patient Account Number:  [id number] Date of Birth/Sex: Treating RN: February 18, 1971 (48 y.o. [name] Primary Care Provider: Daisy [name] Other Clinician: Sandre [name] Referring Provider: Treating Provider/Extender:[name], [name], [name] Weeks in Treatment: 52 History of Present Illness Location: Patient has a left foot open ulcer, the lateral foot Quality: Patient reports No Pain. Severity: Patient states wound(s) are getting better. Duration: Patient has had the wound for > 3 months prior to seeking treatment at the wound center Context: the wound on her left lower extremity has been there for at least 6 months Modifying Factors: Patient wound(s)/ulcer(s) are improving due CW:CBJS antibiotics and recent surgery Associated Signs  and Symptoms: Patient reports having:continued drainage from the wound HPI Description: 48 year old diabetic who is known to have type 1 diabetes which is poorly controlled last hemoglobin A1c was 11%. She comes in with a ulcerated area on the left lateral foot which has been there for over 6 months. Was recently she has been treated by Dr. Amalia [name] of podiatry who saw her last on 05/28/2016. Review of his notes revealed that the patient had incision and drainage with placement of antibiotic beads to the left foot on 04/11/2016 for possible osteomyelitis of the cuboid bone. Over the last year she's had a history of amputation of the left fifth toe and a femoropopliteal popliteal bypass graft somewhere in April 2017. 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 His note Dr. Amalia [name] mentions that the patient has been referred to me for further wound care and possibly great candidate for hyperbaric oxygen therapy due to recurrent osteomyelitis. However we do not have any x-rays of biopsy reports confirming this. He has been on several antibiotics including Bactrim and most recently is on doxycycline for an MRSA. I understand, the patient was not a candidate for IV  antibiotics as she has had previous PICC lines which resulted in blood clots in both arms. There was a x-ray report dated 04/04/2016 on Dr. Amalia [name] notes which showed evidence of fifth ray resection left foot with osteolytic changes noted to the fourth metatarsal and cuboid bone on the left. 06/13/2016 -- had a left foot x-ray which showed no acute fracture or dislocation and no definite radiographic evidence of osteomyelitis. Advanced osteopenia was seen. 06/20/2016 -- she has noticed a new wound on the right plantar foot in the region where she had a callus before. 06/27/16- the patient did have her x-ray of the right foot which showed no findings to suggest osteomyelitis. She saw her endocrinologist, [name], yesterday. Her A1c in January was 11. He also indicates mismanagement and noncompliance regarding her diabetes. She is currently on Bactrim for a lip infection. She is complaining of nausea, vomiting and diarrhea. She is unable to articulate the exact orders or dosing of the Bactrim; it is unclear when she will complete this. 07/04/2016 -- results from [facility] of ABIs with ankle waveforms were noted from 02/14/2016. The examination done on 06/27/2015 showed noncompressible ABIs with the right being 1.45 and the left being 1.33. The present examination showed a right ABI of 1.19 on the left of 1.33. The conclusion was that right normal ABI in the lower extremity at rest however compared to previous study which was noncompressible ABI may be falsely elevated side suggesting medial calcification. The left ABI suggested medial calcification. 08/01/2016 -- the patient had more redness and pain on her right foot and did not get to come to see as noted she see her PCP or go to the ER and decided to take some leftover metronidazole which she had at home. As usual, the patient does report she feels and is rather noncompliant. 08/08/2016 -- -- x-ray of the right foot -- FINDINGS:Transmetatarsal  amputation is noted. No bony destruction is noted to suggest osteomyelitis. IMPRESSION: No evidence of osteomyelitis. Postsurgical changes are seen. MRI would be more sensitive for possible bony changes. Culture has grown Serratia Marcescens -- sensitive to Bactrim, ciprofloxacin, ceftazidime she was seen by [name] on 08/06/2016. He did not find any exposed bone, muscle, tendon, ligament or joint. There was no malodor and he did a excisional debridement in the office. ============ Old  notes: 48 year old patient who is known to the wound clinic for a while had been away from the wound clinic since 09/01/2014. Over the last several months she has been admitted to various hospitals including [facility]. She was treated for a right metatarsal osteomyelitis with a transmetatarsal amputation and this was done about 2 months ago. He has a small ulcerated area on the right heel and she continues to have an ulcerated area on the left plantar aspect of the foot. The patient was recently admitted to the [facility] hospital group between 7/12 and 10/18/2014. she was given 3 weeks of IV vancomycin and was to follow-up with her surgeons at [facility] and also took oral vancomycin for C. difficile colitis. 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 MRI of the left foot done on 10/14/2014 showed no abscess or osteomyelitis. 04/27/15; this is a patient we know from previous stays in the wound care center. She is a type I diabetic I am not sure of her control currently. Since the last time I saw her she is had a right transmetatarsal amputation and has no wounds on her right foot and has no open wounds. She  is been followed at the wound care center at [facility] in [place]. She comes today with the desire to undergo hyperbaric treatment locally. Apparently one of her wound care providers in [place] has suggested hyperbarics. This is in response to an MRI from 04/18/15 that showed increased marrow signal and loss of the proximal fifth metatarsal cortex evidence of osteomyelitis with likely early osteomyelitis in the cuboid bone as well. She has a large wound over the base of the fifth metatarsal. She also has a eschar over her the tips of her toes on 1,3 and 5. She does not have peripheral pulses and apparently is going for an angiogram tomorrow which seems reasonable. After this she is going to infectious disease at [facility]. They have been using Medihoney to the large wound on the lateral aspect of the left foot to. The patient has known Charcot deformity from diabetic neuropathy. She also has known diabetic PAD. Surprisingly I can't see that she has had any recent antibiotics, the patient states the last antibiotic she had was at the end of November for 10 days. I think this was in response to culture that showed group G strep although I'm not exactly sure where the culture was from. She is also had arterial studies on 03/29/15. This showed a right ABI of 1.4 that was noncompressible. Her left ABI was 0.73. 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 It was not felt that arterial inflow was adequate for healing of a foot ulcer. Her Doppler waveforms looked monophasic ===== READMISSION 02/28/17; this is in an now 48 year old woman we've had at several different occasions in this clinic. 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 She has a remote ex-smoker. She was last seen in this clinic by Dr. Con Memos I think in May. More recently she is been followed by her podiatrist Dr. Amalia [name] an infectious disease Dr. Megan Salon. She has 2  open wounds the major one is over the right first metatarsal head she also has a wound on the left plantar foot. an MRI  of the right foot on 01/01/17 showed a soft tissue ulcer along the plantar aspect of the first metatarsal base consistent with osteomyelitis of the first metatarsal stump. Dr. 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 According to the patient this is been open for slightly over a month. She has been on a combination of Cipro 500 twice a day, Zyvox 600 twice a day and Flagyl 500 3 times a day for over a month now as directed by Dr. Megan Salon. cultures of the right foot earlier this year showed MRSA in January and Serratia in May. January also had a few viridans strep. Recent x-rays of both feet were done and Dr. Amalia [name] office and I don't have these reports. The patient has known PAD and has a history of aleft femoropopliteal bypass in April 2017. 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 Dwyane Dee. In spite of this the last hemoglobin A1c I can see is 10.1 on 01/01/2017. She is being referred by Dr. 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 She is been using Medihoney to this area and also an area on the left midfoot. She is using healing sandals bilaterally. ABIs in this clinic at the left posterior tibial was 1.1 noncompressible on the right READMISSION Non invasive vascular NOVANT 5/18 Aftercare following surgery of the circulatory system Procedure Note - Interface, External Ris In - 08/13/2016 11:05 AM EDT Procedure: Examination consists of physiologic resting arterial pressures of the brachial and ankle arteries bilaterally with continuous wave Doppler waveform  analysis. Previous: Previous exam performed on 02/14/16 demonstrated ABIs of Rt = 1.19 and Lt = 1.33. Right: ABI = non-compressible PT, 1.47 DP. S/P transmet amputation. Left: ABI = 1.52, 2nd digit pressure = 87 mmHg Conclusions: Right: ABI (>1.3) may be falsely elevated, suggesting medial calcification. Left: ABI (>1.3) may be falsely elevated, suggesting medial calcification The patient is a now 48 year old type I diabetic is had multiple issues her graded to chronic diabetic foot ulcers. 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 We had her in the clinic lastin November. At that point she had wounds on her bilateral feet.she had wanted to try hyperbarics however the healogics review process denied her because she hadn't followed up with her vascular surgeon for her left femoropopliteal bypass. The bypass was done by Dr. Raul Del at [facility]. We made her a follow-up with Dr. Raul Del however she did not keep the appointment and therefore she was not approved The patient shows me a small wound on her left fourth metatarsal head on her phone. She developed rapid discoloration in the plantar aspect of the left foot and she was admitted to hospital from 2/2 through 05/10/17 with wet gangrene of the left foot osteomyelitis of the fourth metatarsal heads. She was admitted acutely ill with a temperature of 103. She was started on broad-spectrum vancomycin and cefepime. On 05/06/17 she was taken to the OR by Dr. Amalia [name] her podiatric surgeon for an incision and drainage irrigation of the left foot wound. Cultures from this surgery revealed group be strep and anaerobes. she was seen by Dr.Xu of orthopedic surgery and scheduled for a below-knee amputation which she u refused. Ultimately  she was discharged on Levaquin and Flagyl for one month. MRI 05/05/17 done while she was in the hospital showed abscess adjacent to the fourth  metatarsal head and neck small abscess around the fourth flexor tendon. Inflammatory phlegmon and gas in the soft tissues along the lateral aspect of the fourth phalanx. Findings worrisome for osteomyelitis involving the fourth proximal and middle phalanx and also the third and fourth metatarsals. Finally the patient had actually shortly before this followed up with Dr. Raul Del at no time on 04/29/17. He felt that her left femoropopliteal bypass was patent he felt that her left-sided toe pressures more than adequate for healing a wound on the left foot. This was before her acute presentation. 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 05/28/17; she is started hyperbarics. The patient tells me that for some reason she was not actually on Levaquin but I think on ciprofloxacin. She was on Flagyl. She only started her Levaquin yesterday due to some difficulty with the pharmacy and perhaps her sister picking it up. She has an appointment with Dr. Amalia [name] tomorrow and with infectious disease early next week. She has no new complaints 06/06/17; the patient continues in hyperbarics. She saw Dr. Amalia [name] on 05/29/17 who is her podiatric surgeon. He is elected for a transmetatarsal amputation on 06/27/17. I'm not sure at what level he plans to do this amputation. The patient is unaware She also saw Dr. Megan Salon of infectious disease who elected to continue her on current antibiotics I think this is ciprofloxacin and Flagyl. I'll need to clarify with her tomorrow if she actually has this. We're using silver alginate to the actual wound. Necrotic surface today with material under the flap of her foot. Original MRI showed abscesses as well as osteomyelitis of the proximal and middle fourth phalanx and the third and fourth metatarsal heads 06/11/17; patient continues in hyperbarics and continues on oral antibiotics. She is doing well. The wound looks better. The necrotic part of this under the flap in her superior foot  also looks better. she is been to see Dr. Amalia [name]. I haven't had a chance to look at his note. Apparently he has put the transmetatarsal amputation on hold her request it is still planning to take her to the OR for debridement and product application ACEL. I'll see if I can find his note. I'll therefore leave product ordering/requests to Dr. Amalia [name] for now. I was going to look at Dermagraft 06/18/17-she is here in follow-up evaluation for bilateral foot wounds. She continues with hyperbaric therapy. She states she has been applying manuka honey to the right plantar foot and alternate manuka honey and silver alginate to the left foot, despite our orders. We will continue with same treatment plan and she will follo up next week. 06/25/17; I have reviewed Dr. Amalia [name] last note from 3/11. She has operative debridement in 2 days' time. By review his note apparently they're going to place there is skin over the majority of this wound which is a good choice. She has a small satellite area at the most proximal part of this wound on the left plantar foot. The area on the right plantar foot we've been using silver alginate and it is close to healing. 07/02/17; unfortunately the patient was not easily approved for Dr. Amalia [name] proposed surgery. I'm not completely certain what the issue is. She has been using silver alginate to the wound she has completed a first course of hyperbarics. She is still on Levaquin and Flagyl. I have really lost  track of the time course here.I suspect she should have another week to 2 of antibiotics. I'll need to see if she is followed up with infectious disease Dr. Megan Salon 07/09/17; the patient is followed up with Dr. Megan Salon. 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 She continues on Levaquin and metronidazole continuing both of these for now I think she is been on fr about 6 weeks. She still has some drainage but no pain. No fever. Her had been  plans for her to go to the OR for operative debridement with her podiatrist Dr. Amalia [name], I am not exactly sure where that is. I'll probably slip a note to Dr. Amalia [name] today. I note that she follows with Dr. Dwyane Dee of endocrinology. We have her recertified for hyperbaric oxygen. I have not heard about Dermagraft however I'll see if Dr. Amalia [name] is planning a skin substitute as well 07/16/17; the patient tells me she is just about out of [place]. I'll need to check Dr. Hale Bogus last notes on this. She states she has plenty of Flagyl however. She comes in today complaining of pain in the right lateral foot which she said lasted for about a day. The wound on the right foot is actually much more medially. She also tells me that the [facility] cost a lot of pain in the left foot wound and she turned back to silver alginate. Finally Dermagraft has a $161 per application co-pay. She cannot afford this 07/23/17; patient arrives today with the wound not much smaller. There is not much new to add. She has not heard from Dr. Amalia [name] all try to put in a call to them today. She was asking about Dermagraft again and she has an over $096 per application co-pay she states that she would be willing to try to do a payment plan. I been tried to avoid this. We've been using silver alginate, I'll change to [facility] 07/30/17-She is here in follow-up evaluation for left foot ulcer. She continues hyperbaric medicine. The left foot ulcer is stable we will continue with same treatment plan 08/06/17; she is here for evaluation of her left foot ulcer. Currently being treated for hyperbarics or underlying osteomyelitis. She is completed antibiotics. The left foot ulcer is better smaller with healthier looking granulation. For various reasons I am not really clear on we never got her back to the OR with Dr. Amalia [name]. He did not respond to my secure text message. Nevertheless I think that surgery on this point is not necessary nor am  I completely clear that a skin substitute is necessary The patient is complaining about pain on the outside of her right foot. 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 There is no erythema. She also states the foot is warm versus her other part of her upper leg and this is largely true. It is not totally clear to me what's causing this. She thinks it's different from her usual neuropathy pain 08/13/17; she arrives in clinic today with a small wound which is superficial on her right first metatarsal head. 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 She tells [place] she was up on her feet over the Mother's Day celebration. The large wound is on the left foot. Continues with hyperbarics for underlying osteomyelitis. We're using Hydrofera Blue. She asked me today about where we were with Dermagraft. I had actually excluded this because of the co- pay however she wants to assume this therefore  I'll recheck the co-pay an order for next week. 08/20/17; the patient agreed to accept the co-pay of the first Apligraf which we applied today. She is disappointed she is finishing hyperbarics will run this through the insurance on the extent of the foot infection and the extent of the wound that she had however she is already had 60 dive's. Dermagraft No. 1 08/27/17; Dermagraft No. 2. She is not eligible for any more hyperbaric treatments this month. She reports a fair amount of drainage and she actually changed to the external dressings without disturbing the direct contact layer 09/03/17; the patient arrived in clinic today with the wound superficially looking quite healthy. Nice vibrant red tissue with some advancing epithelialization although not as much adherence of the flap as I might like. However she noted on her own fourth toe some bogginess and she brought that to our attention. Indeed this was boggy feeling like a possibility of subcutaneous fluid. She stated that this was  similar to how an issue came up on the lateral foot that led to her fifth ray amputation. She is not been unwell. We've been using Dermagraft 09/10/17; the culture that I did not last week was MRSA. She saw Dr. Megan Salon this morning who is going to start her on vancomycin. I had sent him a secure a text message yesterday. I also spoke with her podiatric surgeon Dr. Amalia [name] about surgery on this foot the options for conserving a functional foot etc. Promised me he would see her and will make back consultation today. Paradoxically her actual wound on the plantar aspect of her left foot looks really quite good. I had given her 5 days worth of Baxdella to cover her for MRSA. Her MRI came back showing osteomyelitis within the third metatarsal shaft and head and base of the third and fourth proximal phalanx. She had extensive inflammatory changes throughout the soft tissue of the lateral forefoot. With an ill-defined fluid around the fourth metatarsal extending into the plantar and dorsal soft tissues 09/19/17; the patient is actually on oral Septra and Flagyl. She apparently refused IV vancomycin. She also saw Dr. Amalia [name] at my request who is planning her for a left BKA sometime in mid July. MRI showed osteomyelitis within the third metatarsal shaft and head and the basis of the third and fourth proximal phalanx. I believe there was felt to be possible septic arthritis involving the third MTP. 09/26/17; the patient went back to Dr. Megan Salon at my suggestion and is now receiving IV daptomycin. Her wound continues to look quite good making the decision to proceed with a transmetatarsal amputation although more difficult for the patient. I believe in my extensive discussions with her she has a good sense of the pros and cons of this. I don't NV the tuft decision she has to make. She has an appointment with Dr. Amalia [name] I believe in mid July and I previously spoken to him about this issue Has we had used 3 previous  Dermagraft. Given the condition of the wound surface I went ahead and added the fourth one today area and I did this not fully realizing that she'll be traveling to [place] next week. I'm hopeful she can come back in 2 weeks 10/21/17; Her same Dermagraft on for about 3-1/2 weeks. In spite of this the wound arrives looking quite healthy. There is been a lot of healing dimensions are smaller. Looking at the square shaped wound she has now there is some undermining and some depth medially under  the undermining although I cannot palpate any bone. No surrounding infection is obvious. She has difficult questions about how to look at this going forward vis--vis amputations versus continued medical therapy. To be truthful the wound is looks so healthy and it is continued to contract. Hard to justify foot surgery at this point although I still told her that I think it might come to that if we are not able to eradicate the underlying MRSA. She is still highly at risk and she understands this 11/06/17 on evaluation today patient appears to be doing better in regard to her foot ulcer. She's been tolerating the dressing changes without complication. Currently she is here for her Dermagraft #6. Her wound continues to make excellent progress at this point. She does not appear to have any evidence of infection which is good news. 11/13/17 on evaluation today patient appears to be doing excellent at this time. She is here for repeat Dermagraft application. This is #7. Overall her wound seems to be making great progress. 12/05/17; the patient arrives with the wound in much better condition than when I last saw this almost 6 weeks ago. She still has a small probing area in the left metatarsal head region on the lateral aspect of her foot. We applied her last Dermagraft today. Since the last time she is here she has what appears to a been a blood blister on the plantar aspect of left foot although I don't see this is  threatening. There is also a thick raised tissue on the right mid metatarsal head region. This was not there I don't think the last time she was here 3 weeks ago. 12/12/17; the patient continues to have a small programming area in the left metatarsal head region on the lateral aspect of her foot which was the initial large surgical wound. I applied her last Apligraf last week. I'm going to use Endoform starting today Unfortunately she has an excoriated area in the left mid foot and the right mid foot. The left midfoot looks like a blistered area this was not opened last week it certainly is open today. Using silver alginate on these areas. She promises me she is offloading this. 12/19/17; the small probing area in the left metatarsal head eyes think is shallower. In general her original wound looks better. We've been using Endoform. The area inferiorly that I think was trauma last week still requires debridement a lot of nonviable surface which I removed. She still has an open open area distally in her foot Similarly on the right foot there is tightly adherent surface debris which I removed. Still areas that don't look completely epithelialized. This is a small open area. We used silver alginate on these areas 12/26/2017; the patient did not have the supplies we ordered from last week including the Endoform. The original large wound on the left lateral foot looks healthy. She still has the undermining area that is largely unchanged from last week. She has the same heavily callused raised edged wounds on the right mid and left midfoot. Both of these requiring debridement. We have been using silver alginate on these areas 01/02/2018; there is still supply issues. We are going to try to use Prisma but I am not sure she actually got it from what she is saying. She has a new open area on the lateral aspect of the left fourth toe [previous fifth ray amputation]. Still the one tunneling area over the fourth  metatarsal head. The area is in the  midfoot bilaterally still have thick callus around them. She is concerned about a raised swelling on the lateral aspect of the foot. However she is completely insensate 01/10/2018; we are using Prisma to the wounds on her bilateral feet. Surprisingly the tunneling area over the left fourth metatarsal head that was part of her original surgery has closed down. She has a small open area remaining on the incision line. 2 open areas in the midfoot. 02/10/2018; the patient arrives back in clinic after a month hiatus. She was traveling to visit family in [place]. Is fairly clear she was not offloading the areas on her feet. The original wound over the left lateral foot at the level of metatarsal heads is reopened and probes medially by about a centimeter or 2. She notes that a week ago she had purulent drainage come out of an area on the left midfoot. Paradoxically the worst area is actually on the right foot is extensive with purulent drainage. We will use silver alginate today 02/17/2018; the patient has 3 wounds one over the left lateral foot. She still has a small area over the metatarsal heads which is the remnant of her original surgical wound. This has medial probing depth of roughly 1.4 cm somewhat better than last week. The area on the right foot is larger. We have been using silver alginate to all areas. The area on the right foot and left foot that we cultured last week showed both Klebsiella and Proteus. Both of these are quinolone sensitive. The patient put her's self on Bactrim and Flagyl that she had left hanging around from prior antibiotic usages. She was apparently on this last week when she arrived. I did not realize this. Unfortunately the Bactrim will not cover either 1 of these organisms. We will send in Cipro 500 twice daily for a week 03/04/2018; the patient has 2 wounds on the left foot one is the original wound which was a surgical wound for  a deep DFU. At one point this had exposed bone. She still has an area over the fourth metatarsal head that probes about 1.4 cm although I think this is better than last week. I been using silver nitrate to try and promote tissue adherence and been using silver alginate here. She also has an area in the left midfoot. This has some depth but a small linear wound. Still requiring debridement. On the right midfoot is a circular wound. A lot of thick callus around this area. We have been using silver alginate to all wound areas She is completed the ciprofloxacin I gave her 2 weeks ago. 03/11/2018; the patient continues to have 2 open areas on the left foot 1 of which was the original surgical wound for a deep DFU. Only a small probing area remains although this is not much different from last week we have been using silver alginate. The other area is on the midfoot this is smaller linear but still with some depth. We have been using silver alginate here as well On the right foot she has a small circular wound in the mid aspect. This is not much smaller than last time. We have been using silver alginate here as well 03/18/2018; she has 3 wounds on the left foot the original surgical wound, a very superficial wound in the mid aspect and then finally the area in the mid plantar foot. She arrives in today with a very concerning area in the wound in the mid plantar foot which is her most  proximal wound. There is undermining here of roughly 1-1/2 cm superiorly. Serosanguineous drainage. She tells me she had some pain on for over the weekend that shot up her foot into her thigh and she tells me that she had a nodule in the groin area. She has the single wound in the right foot. We are using endoform to both wound areas 03/24/2018; the patient arrives with the original surgical wound in the area on the left midfoot about the same as last week. There is a collection of fluid under the surface of the skin  extending from the surgical wound towards the midfoot although it does not reach the midfoot wound. The area on the right foot is about the same. Cultures from last week of the left midfoot wound showed abundant Klebsiella abundant Enterococcus faecalis and moderate methicillin resistant staph I gave her Levaquin but this would have only covered the Klebsiella. She will need linezolid 04/01/2018; she is taking linezolid but for the first few days only took 1 a day. I have advised her to finish this at twice daily dosing. In any case all of her wounds are a lot better especially on the left foot. The original surgical wound is closed. The area on the left midfoot considerably smaller. The area on the right foot also smaller. 04/08/2018; her original surgical wound/osteomyelitis on the left foot remains closed. She has area on the left foot that is in the midfoot area but she had some streaking towards this. This is not connected with her original wound at least not visually. Small wound on the right midfoot appears somewhat smaller. 04/15/18; both wounds looks better. Original wound is better left midfoot. Using silver alginate 1/21; patient states she uses saltwater soak in, stones or remove callus from around her wounds. She is also concerned about a blood blister she had on the left foot but it simply resolved on its own. We've been using silver alginate 1/28; the patient arrives today with the same streaking area from her metatarsals laterally [the site of her original surgical wound] down to the middle of her foot. There is some drainage in the subcutaneous area here. This concerns me that there is actually continued ongoing infection in the metatarsals probably the fourth and third. This fixates an MRI of the foot without contrast [chronic renal failure] The wound in the mid part of the foot is small but I wonder whether this area actually connects with the more distal foot. The area on the  right midfoot is probably about the same. Callus thick skin around the small wound which I removed with a curette we have been using silver alginate on both wound areas 2/4; culture I did of the draining site on the left foot last time grew methicillin sensitive staph aureus. MRI of the left foot showed interval resolution of the findings surrounding the third metatarsal joint on the prior study consistent with treated osteomyelitis. Chronic soft tissue ulceration in the plantar and lateral aspect of the forefoot without residual focal fluid collection. No evidence of recurrent osteomyelitis. 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 She tells me she had nausea and vomiting over the weekend and missed 2 days. In general her foot looks much the same. She has a  small open area just below the left fourth metatarsal head. A linear area in the left midfoot. Some discoloration extending from the inferior part of this into the left lateral foot although this appears to be superficial. She has a small area on the right midfoot which generally looks smaller after debridement 2/18; the patient is completing his cephalexin and has another 2 days. She continues to have open areas on the left and right foot. 2/25; she is now off antibiotics. The area on the left foot at the site of her original surgical wound has closed yet again. She still has open areas in the mid part of her foot however these appear smaller. The area on the right mid foot looks about the same. We have been using silver alginate She tells me she had a serious hypoglycemic spell at home. She had to have EMS called and get IV dextrose 3/3; disappointing on the left lateral foot large area of necrotic  tissue surrounding the linear area. This appears to track up towards the same original surgical wound. Required extensive debridement. The area on the right plantar foot is not a lot better also using silver 3/12; the culture I did last time showed abundant enterococcus. I have prescribed Augmentin, should cover any unrecognized anaerobes as well. In addition there were a few MRSA and Serratia that would not be well covered although I did not want to give her multiple antibiotics. She comes in today with a new wound in the right midfoot this is not connected with the original wound over her MTP a lot of thick callus tissue around both wounds but once again she said she is not walking on these areas 3/17-Patient comes in for follow-up on the bilateral plantar wounds, the right midfoot and the left plantar wound. Both these are heavily callused surrounding the wounds. We are continuing to use silver alginate, she is compliant with offloading and states she uses a wheelchair fairly often at home 3/24; both wound areas have thick callus. However things actually look quite a bit better here for the majority of her left foot and the right foot. 3/31; patient continues to have thick callused somewhat irritated looking tissue around the wounds which individually are fairly superficial. There is no evidence of surrounding infection. We have been using silver alginate however I change that to [facility] today Electronic Signature(s) Signed: 07/01/2018 2:21:53 PM By: Nancy Ham MD Entered By: [name] on 07/01/2018 14:17:36 -------------------------------------------------------------------------------- Physical Exam Details Patient Name: Date of Service: [name], [name] 07/01/2018 12:30 PM Medical Record Number:[id number] Patient Account Number: [id number] Date of Birth/Sex: Treating RN: 08-20-1970 (48 y.o. [name] Primary Care Provider: Daisy [name] Other Clinician:  Sandre [name] Referring Provider: Treating Provider/Extender:Chloie Loney, [name], [name] Weeks in Treatment: 81 Constitutional Patient is hypertensive.. Pulse regular and within target range for patient.Marland Kitchen Respirations regular, non-labored and within target range.. Temperature is normal and within the target range for the patient.Marland Kitchen Appears in no distress. Notes Wound exam; debridement of the callus and nonviable subcutaneous tissue. The area on the left still is superficial on the right with slightly more depth although I think both of these have a chance to heal. The large area on the left metatarsal heads laterally remain healed Electronic Signature(s) Signed: 07/01/2018 2:21:53 PM By: Nancy Ham MD Entered By: [name] on 07/01/2018 14:18:33 -------------------------------------------------------------------------------- Physician Orders Details Patient Name: Date of Service: [name] 07/01/2018 12:30 PM Medical Record Number:[id number] Patient Account Number: [id number] Date of Birth/Sex:  Treating RN: 06-Jun-1970 (48 y.o. [name] Primary Care Provider: Daisy [name] Other Clinician: Sandre [name] Referring Provider: Treating Provider/Extender:Scorpio Fortin, [name], [name] Weeks in Treatment: 51 Verbal / Phone Orders: No Diagnosis Coding ICD-10 Coding Code Description E10.621 Type 1 diabetes mellitus with foot ulcer L97.524 Non-pressure chronic ulcer of other part of left foot with necrosis of bone L97.511 Non-pressure chronic ulcer of other part of right foot limited to breakdown of skin L97.521 Non-pressure chronic ulcer of other part of left foot limited to breakdown of skin Follow-up Appointments Return Appointment in 1 week. Dressing Change Frequency Change dressing three times week. Skin Barriers/Peri-Wound Care Moisturizing lotion - to bilateral legs. Wound Cleansing May shower and wash wound with soap and water. Primary Wound  Dressing Wound #41 Left,Proximal,Plantar Foot Calcium Alginate with Silver Secondary Dressing Kerlix/Rolled [name] Dry [name] Edema Control Avoid standing for long periods of time Elevate legs to the level of the heart or above for 30 minutes daily and/or when sitting, a frequency of: Off-Loading Removable cast walker boot to: - left foot Other: - offloading insole in right shoe. Additional Orders / Instructions Other: - patient to start antibiotics. [place] for wound care. - Interim Laboratory Bacteria identified in Unspecified specimen by Anaerobe culture (MICRO) - wound culture right foot - (ICD10 E10.621 - Type 1 diabetes mellitus with foot ulcer) LOINC Code: 659-9 Convenience Name: Anerobic culture Electronic Signature(s) Signed: 08/19/2018 1:55:11 PM By: Nancy Coria RN Signed: 02/17/2019 8:26:20 AM By: Nancy Ham MD Previous Signature: 07/01/2018 2:21:53 PM Version By: Nancy Ham MD Entered By: [name] on 07/29/2018 14:17:51 -------------------------------------------------------------------------------- Problem List Details Patient Name: Date of Service: [name], [name] 07/01/2018 12:30 PM Medical Record Number:[id number] Patient Account Number: [id number] Date of Birth/Sex: Treating RN: 1970-04-11 (48 y.o. [name] Primary Care Provider: Daisy [name] Other Clinician: Sandre [name] Referring Provider: Treating Provider/Extender:Jayani Rozman, [name], [name] Weeks in Treatment: 24 Active Problems ICD-10 Evaluated Encounter Code Description Active Date Today Diagnosis E10.621 Type 1 diabetes mellitus with foot ulcer 05/14/2017 No Yes L97.524 Non-pressure chronic ulcer of other part of left foot 05/28/2017 No Yes with necrosis of bone L97.511 Non-pressure chronic ulcer of other part of right foot 05/28/2017 No Yes limited to breakdown of skin L97.521 Non-pressure chronic ulcer of other part of left foot  02/10/2018 No Yes limited to breakdown of skin Inactive Problems ICD-10 Code Description Active Date Inactive Date A48.0 Gas gangrene 05/14/2017 05/14/2017 M86.072 Acute hematogenous osteomyelitis, left ankle and foot 05/28/2017 05/28/2017 M86.172 Other acute osteomyelitis, left ankle and foot 09/19/2017 09/19/2017 L03.116 Cellulitis of left lower limb 03/18/2018 03/18/2018 Resolved Problems Electronic Signature(s) Signed: 07/01/2018 2:21:53 PM By: Nancy Ham MD Entered By: [name] on 07/01/2018 14:13:44 -------------------------------------------------------------------------------- Progress Note Details Patient Name: Date of Service: [name] 07/01/2018 12:30 PM Medical Record Number:[id number] Patient Account Number: [id number] Date of Birth/Sex: Treating RN: [birth year] (48 y.o. [name] Primary Care Provider: Daisy [name] Other Clinician: Sandre [name] Referring Provider: Treating Provider/Extender:Sharene Krikorian, [name], [name] Weeks in Treatment: 16 Subjective History of Present Illness (HPI) The following HPI elements were documented for the patient's wound: Location: Patient has a left foot open ulcer, the lateral foot Quality: Patient reports No Pain. Severity: Patient states wound(s) are getting better. Duration: Patient has had the wound for > 3 months prior to seeking treatment at the wound center Context: the wound on her left lower extremity has been there for at least 6 months Modifying Factors: Patient wound(s)/ulcer(s) are improving  due PP:JKDT antibiotics and recent surgery Associated Signs and Symptoms: Patient reports having:continued drainage from the wound 48 year old diabetic who is known to have type 1 diabetes which is poorly controlled last hemoglobin A1c was 11%. She comes in with a ulcerated area on the left lateral foot which has been there for over 6 months. Was recently she has been treated by Dr. Amalia [name] of podiatry who  saw her last on 05/28/2016. Review of his notes revealed that the patient had incision and drainage with placement of antibiotic beads to the left foot on 04/11/2016 for possible osteomyelitis of the cuboid bone. Over the last year she's had a history of amputation of the left fifth toe and a femoropopliteal popliteal bypass graft somewhere in April 2017. 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 His note Dr. Amalia [name] mentions that the patient has been referred to me for further wound care and possibly great candidate for hyperbaric oxygen therapy due to recurrent osteomyelitis. However we do not have any x-rays of biopsy reports confirming this. He has been on several antibiotics including Bactrim and most recently is on doxycycline for an MRSA. I understand, the patient was not a candidate for IV antibiotics as she has had previous PICC lines which resulted in blood clots in both arms. There was a x-ray report dated 04/04/2016 on Dr. Amalia [name] notes which showed evidence of fifth ray resection left foot with osteolytic changes noted to the fourth metatarsal and cuboid bone on the left. 06/13/2016 -- had a left foot x-ray which showed no acute fracture or dislocation and no definite radiographic evidence of osteomyelitis. Advanced osteopenia was seen. 06/20/2016 -- she has noticed a new wound on the right plantar foot in the region where she had a callus before. 06/27/16- the patient did have her x-ray of the right foot which showed no findings to suggest osteomyelitis. She saw her endocrinologist, [name], yesterday. Her A1c in January was 11. He also indicates mismanagement and noncompliance regarding her diabetes. She is currently on Bactrim for a lip infection. She is complaining of nausea, vomiting and diarrhea. She is unable to articulate the exact orders or dosing of the Bactrim; it is unclear when she will complete this. 07/04/2016 -- results from [facility] of ABIs with  ankle waveforms were noted from 02/14/2016. The examination done on 06/27/2015 showed noncompressible ABIs with the right being 1.45 and the left being 1.33. The present examination showed a right ABI of 1.19 on the left of 1.33. The conclusion was that right normal ABI in the lower extremity at rest however compared to previous study which was noncompressible ABI may be falsely elevated side suggesting medial calcification. The left ABI suggested medial calcification. 08/01/2016 -- the patient had more redness and pain on her right foot and did not get to come to see as noted she see her PCP or go to the ER and decided to take some leftover metronidazole which she had at home. As usual, the patient does report she feels and is rather noncompliant. 08/08/2016 -- -- x-ray of the right foot -- FINDINGS:Transmetatarsal amputation is noted. No bony destruction is noted to suggest osteomyelitis. IMPRESSION: No evidence of osteomyelitis. Postsurgical changes are seen. MRI would be more sensitive for possible bony changes. Culture has grown Serratia Marcescens -- sensitive to Bactrim, ciprofloxacin, ceftazidime she was seen by [name] on 08/06/2016. He did not find any exposed bone, muscle, tendon, ligament or joint. There was no malodor and he did a excisional  debridement in the office. ============ Old notes: 48 year old patient who is known to the wound clinic for a while had been away from the wound clinic since 09/01/2014. Over the last several months she has been admitted to various hospitals including [place] at [facility]. She was treated for a right metatarsal osteomyelitis with a transmetatarsal amputation and this was done about 2 months ago. He has a small ulcerated area on the right heel and she continues to have an ulcerated area on the left plantar aspect of the foot. The patient was recently admitted to the [facility] hospital group between 7/12 and 10/18/2014. she was given 3  weeks of IV vancomycin and was to follow-up with her surgeons at [facility] and also took oral vancomycin for C. difficile colitis. 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 MRI of the left foot done on 10/14/2014 showed no abscess or osteomyelitis. 04/27/15; this is a patient we know from previous stays in the wound care center. She is a type I diabetic I am not sure of her control currently. Since the last time I saw her she is had a right transmetatarsal amputation and has no wounds on her right foot and has no open wounds. She is been followed at the wound care center at [facility] in [place]. She comes today with the desire to undergo hyperbaric treatment locally. Apparently one of her wound care providers in [place] has suggested hyperbarics. This is in response to an MRI from 04/18/15 that showed increased marrow signal and loss of the proximal fifth metatarsal cortex evidence of osteomyelitis with likely early osteomyelitis in the cuboid bone as well. She has a large wound over the base of the fifth metatarsal. She also has a eschar over her the tips of her toes on 1,3 and 5. She does not have peripheral pulses and apparently is going for an angiogram tomorrow which seems reasonable. After this she is going to infectious disease at [facility]. They have been using Medihoney to the large wound on the lateral aspect of the left foot to. The patient has known Charcot deformity from diabetic neuropathy. She also has known diabetic PAD. Surprisingly I can't see that she has had any recent antibiotics, the patient states the last antibiotic she had was at the end of November for 10 days. I think  this was in response to culture that showed group G strep although I'm not exactly sure where the culture was from. She is also had arterial studies on 03/29/15. This showed a right ABI of 1.4 that was noncompressible. Her left ABI was 0.73. 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 It was not felt that arterial inflow was adequate for healing of a foot ulcer. Her Doppler waveforms looked monophasic ===== READMISSION 02/28/17; this is in an now 48 year old woman we've had at several different occasions in this clinic. 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 She has a remote ex-smoker. She was last seen in this clinic by Dr. Con Memos I think in May. More recently she is been followed by her podiatrist Dr. Amalia [name] an infectious disease Dr. Megan Salon. She has 2 open wounds the major one is over the right first metatarsal head she also has a wound on  the left plantar foot. an MRI of the right foot on 01/01/17 showed a soft tissue ulcer along the plantar aspect of the first metatarsal base consistent with osteomyelitis of the first metatarsal stump. Dr. 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 According to the patient this is been open for slightly over a month. She has been on a combination of Cipro 500 twice a day, Zyvox 600 twice a day and Flagyl 500 3 times a day for over a month now as directed by Dr. Megan Salon. cultures of the right foot earlier this year showed MRSA in January and Serratia in May. January also had a few viridans strep. Recent x-rays of both feet were done and Dr. Amalia [name] office and I don't have these reports. The patient has known PAD and has a history of aleft femoropopliteal bypass in April 2017. She underwent a right TMA in June 2016 and a left fifth ray amputation in April 2017 the patient has an insulin pump and she works closely with her  endocrinologist Dr. Dwyane Dee. In spite of this the last hemoglobin A1c I can see is 10.1 on 01/01/2017. She is being referred by Dr. 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 She is been using Medihoney to this area and also an area on the left midfoot. She is using healing sandals bilaterally. ABIs in this clinic at the left posterior tibial was 1.1 noncompressible on the right READMISSION Non invasive vascular NOVANT 5/18 Aftercare following surgery of the circulatory system Procedure Note - Interface, External Ris In - 08/13/2016 11:05 AM EDT Procedure: Examination consists of physiologic resting arterial pressures of the brachial and ankle arteries bilaterally with continuous wave Doppler waveform analysis. Previous: Previous exam performed on 02/14/16 demonstrated ABIs of Rt = 1.19 and Lt = 1.33. Right: ABI = non-compressible PT, 1.47 DP. S/P transmet amputation. Left: ABI = 1.52, 2nd digit pressure = 87 mmHg Conclusions: Right: ABI (>1.3) may be falsely elevated, suggesting medial calcification. Left: ABI (>1.3) may be falsely elevated, suggesting medial calcification The patient is a now 48 year old type I diabetic is had multiple issues her graded to chronic diabetic foot ulcers. 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 We had her in the clinic lastin November. At that point she had wounds on her bilateral feet.she had wanted to try hyperbarics however the healogics review process denied her because she hadn't followed up with her vascular surgeon for her left femoropopliteal bypass. The bypass was done by Dr. Raul Del at [facility]. We made her a follow-up with Dr. Raul Del however she did not keep the appointment and therefore she was not approved The patient shows me a small wound on  her left fourth metatarsal head on her phone. She developed rapid discoloration in the plantar aspect of the left foot and she was admitted to hospital from 2/2 through 05/10/17 with wet gangrene of the left foot osteomyelitis of the fourth metatarsal heads. She was admitted acutely ill with a temperature of 103. She was started on broad-spectrum vancomycin and cefepime. On 05/06/17 she was taken to the OR by Dr. Amalia [name] her podiatric surgeon for an incision and drainage irrigation of the left foot wound. Cultures from this surgery revealed group be strep and anaerobes. she was seen by Dr.Xu of orthopedic surgery and scheduled for a  below-knee amputation which she u refused. Ultimately she was discharged on Levaquin and Flagyl for one month. MRI 05/05/17 done while she was in the hospital showed abscess adjacent to the fourth metatarsal head and neck small abscess around the fourth flexor tendon. Inflammatory phlegmon and gas in the soft tissues along the lateral aspect of the fourth phalanx. Findings worrisome for osteomyelitis involving the fourth proximal and middle phalanx and also the third and fourth metatarsals. Finally the patient had actually shortly before this followed up with Dr. Raul Del at no time on 04/29/17. He felt that her left femoropopliteal bypass was patent he felt that her left-sided toe pressures more than adequate for healing a wound on the left foot. This was before her acute presentation. 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 05/28/17; she is started hyperbarics. The patient tells me that for some reason she was not actually on Levaquin but I think on ciprofloxacin. She was on Flagyl. She only started her Levaquin yesterday due to some difficulty with the pharmacy and perhaps her sister picking it up. She has an appointment with Dr. Amalia [name] tomorrow and with infectious disease early next week. She has no new complaints 06/06/17; the patient continues in hyperbarics. She saw Dr.  Amalia [name] on 05/29/17 who is her podiatric surgeon. He is elected for a transmetatarsal amputation on 06/27/17. I'm not sure at what level he plans to do this amputation. The patient is unaware ooShe also saw Dr. Megan Salon of infectious disease who elected to continue her on current antibiotics I think this is ciprofloxacin and Flagyl. I'll need to clarify with her tomorrow if she actually has this. We're using silver alginate to the actual wound. Necrotic surface today with material under the flap of her foot. ooOriginal MRI showed abscesses as well as osteomyelitis of the proximal and middle fourth phalanx and the third and fourth metatarsal heads 06/11/17; patient continues in hyperbarics and continues on oral antibiotics. She is doing well. The wound looks better. The necrotic part of this under the flap in her superior foot also looks better. she is been to see Dr. Amalia [name]. I haven't had a chance to look at his note. Apparently he has put the transmetatarsal amputation on hold her request it is still planning to take her to the OR for debridement and product application ACEL. I'll see if I can find his note. I'll therefore leave product ordering/requests to Dr. Amalia [name] for now. I was going to look at Dermagraft 06/18/17-she is here in follow-up evaluation for bilateral foot wounds. She continues with hyperbaric therapy. She states she has been applying manuka honey to the right plantar foot and alternate manuka honey and silver alginate to the left foot, despite our orders. We will continue with same treatment plan and she will follo up next week. 06/25/17; I have reviewed Dr. Amalia [name] last note from 3/11. She has operative debridement in 2 days' time. By review his note apparently they're going to place there is skin over the majority of this wound which is a good choice. She has a small satellite area at the most proximal part of this wound on the left plantar foot. The area on the right plantar foot  we've been using silver alginate and it is close to healing. 07/02/17; unfortunately the patient was not easily approved for Dr. Amalia [name] proposed surgery. I'm not completely certain what the issue is. She has been using silver alginate to the wound she has completed a first course of hyperbarics. She is still on  Levaquin and Flagyl. I have really lost track of the time course here.I suspect she should have another week to 2 of antibiotics. I'll need to see if she is followed up with infectious disease Dr. Megan Salon 07/09/17; the patient is followed up with Dr. Megan Salon. 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 She continues on Levaquin and metronidazole continuing both of these for now I think she is been on fr about 6 weeks. She still has some drainage but no pain. No fever. Her had been plans for her to go to the OR for operative debridement with her podiatrist Dr. Amalia [name], I am not exactly sure where that is. I'll probably slip a note to Dr. Amalia [name] today. I note that she follows with Dr. Dwyane Dee of endocrinology. We have her recertified for hyperbaric oxygen. I have not heard about Dermagraft however I'll see if Dr. Amalia [name] is planning a skin substitute as well 07/16/17; the patient tells me she is just about out of [place]. I'll need to check Dr. Hale Bogus last notes on this. She states she has plenty of Flagyl however. She comes in today complaining of pain in the right lateral foot which she said lasted for about a day. The wound on the right foot is actually much more medially. She also tells me that the [facility] cost a lot of pain in the left foot wound and she turned back to silver alginate. Finally Dermagraft has a $546 per application co-pay. She cannot afford this 07/23/17; patient arrives today with the wound not much smaller. There is not much new to add. She has not heard from Dr. Amalia [name] all try to put in a call to them today. She was asking about  Dermagraft again and she has an over $568 per application co-pay she states that she would be willing to try to do a payment plan. I been tried to avoid this. We've been using silver alginate, I'll change to [facility] 07/30/17-She is here in follow-up evaluation for left foot ulcer. She continues hyperbaric medicine. The left foot ulcer is stable we will continue with same treatment plan 08/06/17; she is here for evaluation of her left foot ulcer. Currently being treated for hyperbarics or underlying osteomyelitis. She is completed antibiotics. The left foot ulcer is better smaller with healthier looking granulation. For various reasons I am not really clear on we never got her back to the OR with Dr. Amalia [name]. He did not respond to my secure text message. Nevertheless I think that surgery on this point is not necessary nor am I completely clear that a skin substitute is necessary The patient is complaining about pain on the outside of her right foot. 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 There is no erythema. She also states the foot is warm versus her other part of her upper leg and this is largely true. It is not totally clear to me what's causing this. She thinks it's different from her usual neuropathy pain 08/13/17; she arrives in clinic today with a small wound which is superficial on her right first metatarsal head. 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 She tells [place] she was up on her feet over the Mother's Day celebration. ooThe large wound is on the left foot. Continues with hyperbarics for underlying osteomyelitis. We're using Hydrofera Blue. She asked me today about where we were with Dermagraft. I had actually excluded this because of the co- pay however  she wants to assume this therefore I'll recheck the co-pay an order for next week. 08/20/17; the patient agreed to accept the co-pay of the first Apligraf which we applied today. She is  disappointed she is finishing hyperbarics will run this through the insurance on the extent of the foot infection and the extent of the wound that she had however she is already had 60 dive's. Dermagraft No. 1 08/27/17; Dermagraft No. 2. She is not eligible for any more hyperbaric treatments this month. She reports a fair amount of drainage and she actually changed to the external dressings without disturbing the direct contact layer 09/03/17; the patient arrived in clinic today with the wound superficially looking quite healthy. Nice vibrant red tissue with some advancing epithelialization although not as much adherence of the flap as I might like. However she noted on her own fourth toe some bogginess and she brought that to our attention. Indeed this was boggy feeling like a possibility of subcutaneous fluid. 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 She is not been unwell. We've been using Dermagraft 09/10/17; the culture that I did not last week was MRSA. She saw Dr. Megan Salon this morning who is going to start her on vancomycin. I had sent him a secure a text message yesterday. I also spoke with her podiatric surgeon Dr. Amalia [name] about surgery on this foot the options for conserving a functional foot etc. Promised me he would see her and will make back consultation today. Paradoxically her actual wound on the plantar aspect of her left foot looks really quite good. I had given her 5 days worth of Baxdella to cover her for MRSA. Her MRI came back showing osteomyelitis within the third metatarsal shaft and head and base of the third and fourth proximal phalanx. She had extensive inflammatory changes throughout the soft tissue of the lateral forefoot. With an ill-defined fluid around the fourth metatarsal extending into the plantar and dorsal soft tissues 09/19/17; the patient is actually on oral Septra and Flagyl. She apparently  refused IV vancomycin. She also saw Dr. Amalia [name] at my request who is planning her for a left BKA sometime in mid July. MRI showed osteomyelitis within the third metatarsal shaft and head and the basis of the third and fourth proximal phalanx. I believe there was felt to be possible septic arthritis involving the third MTP. 09/26/17; the patient went back to Dr. Megan Salon at my suggestion and is now receiving IV daptomycin. Her wound continues to look quite good making the decision to proceed with a transmetatarsal amputation although more difficult for the patient. I believe in my extensive discussions with her she has a good sense of the pros and cons of this. I don't NV the tuft decision she has to make. She has an appointment with Dr. Amalia [name] I believe in mid July and I previously spoken to him about this issue Has we had used 3 previous Dermagraft. Given the condition of the wound surface I went ahead and added the fourth one today area and I did this not fully realizing that she'll be traveling to [place] next week. I'm hopeful she can come back in 2 weeks 10/21/17; Her same Dermagraft on for about 3-1/2 weeks. In spite of this the wound arrives looking quite healthy. There is been a lot of healing dimensions are smaller. Looking at the square shaped wound she has now there is some  undermining and some depth medially under the undermining although I cannot palpate any bone. No surrounding infection is obvious. She has difficult questions about how to look at this going forward vis--vis amputations versus continued medical therapy. To be truthful the wound is looks so healthy and it is continued to contract. Hard to justify foot surgery at this point although I still told her that I think it might come to that if we are not able to eradicate the underlying MRSA. She is still highly at risk and she understands this 11/06/17 on evaluation today patient appears to be doing better in regard to her foot  ulcer. She's been tolerating the dressing changes without complication. Currently she is here for her Dermagraft #6. Her wound continues to make excellent progress at this point. She does not appear to have any evidence of infection which is good news. 11/13/17 on evaluation today patient appears to be doing excellent at this time. She is here for repeat Dermagraft application. This is #7. Overall her wound seems to be making great progress. 12/05/17; the patient arrives with the wound in much better condition than when I last saw this almost 6 weeks ago. She still has a small probing area in the left metatarsal head region on the lateral aspect of her foot. We applied her last Dermagraft today. ooSince the last time she is here she has what appears to a been a blood blister on the plantar aspect of left foot although I don't see this is threatening. There is also a thick raised tissue on the right mid metatarsal head region. This was not there I don't think the last time she was here 3 weeks ago. 12/12/17; the patient continues to have a small programming area in the left metatarsal head region on the lateral aspect of her foot which was the initial large surgical wound. I applied her last Apligraf last week. I'm going to use Endoform starting today ooUnfortunately she has an excoriated area in the left mid foot and the right mid foot. The left midfoot looks like a blistered area this was not opened last week it certainly is open today. Using silver alginate on these areas. She promises me she is offloading this. 12/19/17; the small probing area in the left metatarsal head eyes think is shallower. In general her original wound looks better. We've been using Endoform. The area inferiorly that I think was trauma last week still requires debridement a lot of nonviable surface which I removed. She still has an open open area distally in her foot ooSimilarly on the right foot there is tightly adherent  surface debris which I removed. Still areas that don't look completely epithelialized. This is a small open area. We used silver alginate on these areas 12/26/2017; the patient did not have the supplies we ordered from last week including the Endoform. The original large wound on the left lateral foot looks healthy. She still has the undermining area that is largely unchanged from last week. She has the same heavily callused raised edged wounds on the right mid and left midfoot. Both of these requiring debridement. We have been using silver alginate on these areas 01/02/2018; there is still supply issues. We are going to try to use Prisma but I am not sure she actually got it from what she is saying. She has a new open area on the lateral aspect of the left fourth toe [previous fifth ray amputation]. Still the one tunneling area over the fourth metatarsal  head. The area is in the midfoot bilaterally still have thick callus around them. She is concerned about a raised swelling on the lateral aspect of the foot. However she is completely insensate 01/10/2018; we are using Prisma to the wounds on her bilateral feet. Surprisingly the tunneling area over the left fourth metatarsal head that was part of her original surgery has closed down. She has a small open area remaining on the incision line. 2 open areas in the midfoot. 02/10/2018; the patient arrives back in clinic after a month hiatus. She was traveling to visit family in [place]. Is fairly clear she was not offloading the areas on her feet. The original wound over the left lateral foot at the level of metatarsal heads is reopened and probes medially by about a centimeter or 2. She notes that a week ago she had purulent drainage come out of an area on the left midfoot. Paradoxically the worst area is actually on the right foot is extensive with purulent drainage. We will use silver alginate today 02/17/2018; the patient has 3 wounds one over the  left lateral foot. She still has a small area over the metatarsal heads which is the remnant of her original surgical wound. This has medial probing depth of roughly 1.4 cm somewhat better than last week. The area on the right foot is larger. We have been using silver alginate to all areas. The area on the right foot and left foot that we cultured last week showed both Klebsiella and Proteus. Both of these are quinolone sensitive. The patient put her's self on Bactrim and Flagyl that she had left hanging around from prior antibiotic usages. She was apparently on this last week when she arrived. I did not realize this. Unfortunately the Bactrim will not cover either 1 of these organisms. We will send in Cipro 500 twice daily for a week 03/04/2018; the patient has 2 wounds on the left foot one is the original wound which was a surgical wound for a deep DFU. At one point this had exposed bone. She still has an area over the fourth metatarsal head that probes about 1.4 cm although I think this is better than last week. I been using silver nitrate to try and promote tissue adherence and been using silver alginate here. ooShe also has an area in the left midfoot. This has some depth but a small linear wound. Still requiring debridement. ooOn the right midfoot is a circular wound. A lot of thick callus around this area. ooWe have been using silver alginate to all wound areas ooShe is completed the ciprofloxacin I gave her 2 weeks ago. 03/11/2018; the patient continues to have 2 open areas on the left foot 1 of which was the original surgical wound for a deep DFU. Only a small probing area remains although this is not much different from last week we have been using silver alginate. The other area is on the midfoot this is smaller linear but still with some depth. We have been using silver alginate here as well ooOn the right foot she has a small circular wound in the mid aspect. This is not much  smaller than last time. We have been using silver alginate here as well 03/18/2018; she has 3 wounds on the left foot the original surgical wound, a very superficial wound in the mid aspect and then finally the area in the mid plantar foot. She arrives in today with a very concerning area in the wound in the  mid plantar foot which is her most proximal wound. There is undermining here of roughly 1-1/2 cm superiorly. Serosanguineous drainage. She tells me she had some pain on for over the weekend that shot up her foot into her thigh and she tells me that she had a nodule in the groin area. ooShe has the single wound in the right foot. ooWe are using endoform to both wound areas 03/24/2018; the patient arrives with the original surgical wound in the area on the left midfoot about the same as last week. There is a collection of fluid under the surface of the skin extending from the surgical wound towards the midfoot although it does not reach the midfoot wound. The area on the right foot is about the same. Cultures from last week of the left midfoot wound showed abundant Klebsiella abundant Enterococcus faecalis and moderate methicillin resistant staph I gave her Levaquin but this would have only covered the Klebsiella. She will need linezolid 04/01/2018; she is taking linezolid but for the first few days only took 1 a day. I have advised her to finish this at twice daily dosing. In any case all of her wounds are a lot better especially on the left foot. The original surgical wound is closed. The area on the left midfoot considerably smaller. The area on the right foot also smaller. 04/08/2018; her original surgical wound/osteomyelitis on the left foot remains closed. She has area on the left foot that is in the midfoot area but she had some streaking towards this. This is not connected with her original wound at least not visually. ooSmall wound on the right midfoot appears somewhat  smaller. 04/15/18; both wounds looks better. Original wound is better left midfoot. Using silver alginate 1/21; patient states she uses saltwater soak in, stones or remove callus from around her wounds. She is also concerned about a blood blister she had on the left foot but it simply resolved on its own. We've been using silver alginate 1/28; the patient arrives today with the same streaking area from her metatarsals laterally [the site of her original surgical wound] down to the middle of her foot. There is some drainage in the subcutaneous area here. This concerns me that there is actually continued ongoing infection in the metatarsals probably the fourth and third. This fixates an MRI of the foot without contrast [chronic renal failure] ooThe wound in the mid part of the foot is small but I wonder whether this area actually connects with the more distal foot. ooThe area on the right midfoot is probably about the same. Callus thick skin around the small wound which I removed with a curette we have been using silver alginate on both wound areas 2/4; culture I did of the draining site on the left foot last time grew methicillin sensitive staph aureus. MRI of the left foot showed interval resolution of the findings surrounding the third metatarsal joint on the prior study consistent with treated osteomyelitis. Chronic soft tissue ulceration in the plantar and lateral aspect of the forefoot without residual focal fluid collection. No evidence of recurrent osteomyelitis. 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 She tells me she had nausea and vomiting over the weekend and missed 2 days. In general her foot looks  much the same. She has a small open area  just below the left fourth metatarsal head. A linear area in the left midfoot. Some discoloration extending from the inferior part of this into the left lateral foot although this appears to be superficial. She has a small area on the right midfoot which generally looks smaller after debridement 2/18; the patient is completing his cephalexin and has another 2 days. She continues to have open areas on the left and right foot. 2/25; she is now off antibiotics. The area on the left foot at the site of her original surgical wound has closed yet again. She still has open areas in the mid part of her foot however these appear smaller. The area on the right mid foot looks about the same. We have been using silver alginate She tells me she had a serious hypoglycemic spell at home. She had to have EMS called and get IV dextrose 3/3; disappointing on the left lateral foot large area of necrotic tissue surrounding the linear area. This appears to track up towards the same original surgical wound. Required extensive debridement. The area on the right plantar foot is not a lot better also using silver 3/12; the culture I did last time showed abundant enterococcus. I have prescribed Augmentin, should cover any unrecognized anaerobes as well. In addition there were a few MRSA and Serratia that would not be well covered although I did not want to give her multiple antibiotics. She comes in today with a new wound in the right midfoot this is not connected with the original wound over her MTP a lot of thick callus tissue around both wounds but once again she said she is not walking on these areas 3/17-Patient comes in for follow-up on the bilateral plantar wounds, the right midfoot and the left plantar wound. Both these are heavily callused surrounding the wounds. We are continuing to use silver alginate, she is compliant with offloading and states she uses a wheelchair fairly often at home 3/24; both wound areas  have thick callus. However things actually look quite a bit better here for the majority of her left foot and the right foot. 3/31; patient continues to have thick callused somewhat irritated looking tissue around the wounds which individually are fairly superficial. There is no evidence of surrounding infection. We have been using silver alginate however I change that to Hydrofera Blue today Objective Constitutional Patient is hypertensive.. Pulse regular and within target range for patient.Marland Kitchen Respirations regular, non-labored and within target range.. Temperature is normal and within the target range for the patient.Marland Kitchen Appears in no distress. Vitals Time Taken: 11:06 AM, Height: 67 in, Weight: 125 lbs, BMI: 19.6, Temperature: 98.7 F, Pulse: 92 bpm, Respiratory Rate: 18 breaths/min, Blood Pressure: 179/85 mmHg. General Notes: Wound exam; debridement of the callus and nonviable subcutaneous tissue. The area on the left still is superficial on the right with slightly more depth although I think both of these have a chance to heal. ooThe large area on the left metatarsal heads laterally remain healed Integumentary (Hair, Skin) Wound #37 status is Open. Original cause of wound was Pressure Injury. The wound is located on the [place]. The wound measures 0.5cm length x 0.5cm width x 0.3cm depth; 0.196cm^2 area and 0.059cm^3 volume. There is Fat Layer (Subcutaneous Tissue) Exposed exposed. There is no tunneling or undermining noted. There is a small amount of serosanguineous drainage noted. The wound margin is thickened. There is large (67-100%) red granulation within the wound  bed. There is no necrotic tissue within the wound bed. The periwound skin appearance exhibited: Callus. The periwound skin appearance did not exhibit: Crepitus, Excoriation, Induration, Rash, Scarring, Dry/Scaly, Maceration, Atrophie Blanche, Cyanosis, Ecchymosis, Hemosiderin Staining, Mottled, Pallor,  Rubor, Erythema. Periwound temperature was noted as No Abnormality. Wound #41 status is Open. Original cause of wound was Gradually Appeared. The wound is located on the Left,Proximal,Plantar Foot. The wound measures 0.3cm length x 0.2cm width x 0.1cm depth; 0.047cm^2 area and 0.005cm^3 volume. There is Fat Layer (Subcutaneous Tissue) Exposed exposed. There is no tunneling or undermining noted. There is a small amount of serosanguineous drainage noted. The wound margin is flat and intact. There is large (67-100%) red granulation within the wound bed. There is no necrotic tissue within the wound bed. The periwound skin appearance exhibited: Callus. The periwound skin appearance did not exhibit: Crepitus, Excoriation, Induration, Rash, Scarring, Dry/Scaly, Maceration, Atrophie Blanche, Cyanosis, Ecchymosis, Hemosiderin Staining, Mottled, Pallor, Rubor, Erythema. Periwound temperature was noted as No Abnormality. Wound #42 status is Open. Original cause of wound was Gradually Appeared. The wound is located on the [place]. The wound measures 0cm length x 0cm width x 0cm depth; 0cm^2 area and 0cm^3 volume. There is Fat Layer (Subcutaneous Tissue) Exposed exposed. There is a none present amount of drainage noted. The wound margin is flat and intact. There is no granulation within the wound bed. There is no necrotic tissue within the wound bed. The periwound skin appearance did not exhibit: Callus, Crepitus, Excoriation, Induration, Rash, Scarring, Dry/Scaly, Maceration, Atrophie Blanche, Cyanosis, Ecchymosis, Hemosiderin Staining, Mottled, Pallor, Rubor, Erythema. Periwound temperature was noted as No Abnormality. 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 There was a Excisional Skin/Subcutaneous Tissue Debridement with a total area of 0.25 sq cm performed by [name]., MD. With the following instrument(s): Curette to remove Viable and Non-Viable tissue/material. Material removed includes Subcutaneous Tissue, Slough, Skin: Dermis, and Skin: Epidermis after achieving pain control using Lidocaine 5% topical ointment. No specimens were taken. A time out was conducted at 11:33, prior to the start of the procedure. A Moderate amount of bleeding was controlled with Silver Nitrate. The procedure was tolerated well with a pain level of 0 throughout and a pain level of 0 following the procedure. Post Debridement Measurements: 0.5cm length x 0.5cm width x 0.3cm depth; 0.059cm^3 volume. Character of Wound/Ulcer Post Debridement is improved. Severity of Tissue Post Debridement is: Fat layer exposed. Post procedure Diagnosis Wound #37: Same as Pre-Procedure Wound #41 Pre-procedure diagnosis of Wound #41 is a Diabetic Wound/Ulcer of the Lower Extremity located on the Left,Proximal,Plantar Foot .Severity of Tissue Pre Debridement is: Fat layer exposed. There was a Excisional Skin/Subcutaneous Tissue Debridement with a total area of 0.06 sq cm performed by [name]., MD. With the following instrument(s): Curette to remove Viable and Non-Viable tissue/material. Material removed includes Subcutaneous Tissue, Slough, Skin: Dermis, and Skin: Epidermis after achieving pain control using Lidocaine 5% topical ointment. No specimens were taken. A time out was conducted at 11:33, prior to the start of the procedure.  A Moderate amount of bleeding was controlled with Silver Nitrate. The procedure was tolerated  well with a pain level of 0 throughout and a pain level of 0 following the procedure. Post Debridement Measurements: 0.3cm length x 0.2cm width x 0.1cm depth; 0.005cm^3 volume. Character of Wound/Ulcer Post Debridement is improved. Severity of Tissue Post Debridement is: Fat layer exposed. Post procedure Diagnosis Wound #41: Same as Pre-Procedure Plan Follow-up Appointments: Return Appointment in 1 week. Dressing Change Frequency: Change dressing three times week. Skin Barriers/Peri-Wound Care: Moisturizing lotion - to bilateral legs. Wound Cleansing: May shower and wash wound with soap and water. Primary Wound Dressing: Wound #37 Right,Plantar Foot: Hydrofera Blue Wound #41 Left,Proximal,Plantar Foot: Calcium Alginate with Silver Secondary Dressing: Kerlix/Rolled [name] Dry [name] Edema Control: Avoid standing for long periods of time Elevate legs to the level of the heart or above for 30 minutes daily and/or when sitting, a frequency of: Off-Loading: Removable cast walker boot to: - left foot Other: - offloading insole in right shoe. Additional Orders / Instructions: Other: - patient to start antibiotics. Home Health: [place] for wound care. - Interim Laboratory ordered were: Anerobic culture - wound culture right foot 1. I have changed the primary dressing to [facility] Blue in case silver alginate is contributing to the irritation of the surrounding skin 2. There was some purulent drainage when I started debriding the wound on the right I did a culture of this although subsequent debridement did not really verify that there was anything particularly concerning. No empiric antibiotics Electronic Signature(s) Signed: 07/01/2018 2:21:53 PM By: Nancy Ham MD Entered By: [name] on 07/01/2018 14:19:36 -------------------------------------------------------------------------------- SuperBill Details Patient Name: Date of Service: [name] 07/01/2018 Medical Record Number:[id number] Patient Account Number: [id number] Date of Birth/Sex: Treating RN: [birth year] (48 y.o. [name] Primary Care Provider: Daisy [name] Other Clinician: Sandre [name] Referring Provider: Treating Provider/Extender:Tyrica Afzal, [name], [name] Weeks in Treatment: 59 Diagnosis Coding ICD-10 Codes Code Description E10.621 Type 1 diabetes mellitus with foot ulcer L97.524 Non-pressure chronic ulcer of other part of left foot with necrosis of bone L97.511 Non-pressure chronic ulcer of other part of right foot limited to breakdown of skin L97.521 Non-pressure chronic ulcer of other part of left foot limited to breakdown of skin Facility Procedures The patient participates with Medicare or their insurance follows the Medicare Facility Guidelines: CPT4 Code Description Modifier Quantity [id number] - DEB SUBQ TISSUE 20 SQ CM/< 1 ICD-10 Diagnosis Description L97.511 Non-pressure chronic ulcer of  other part of right foot limited to breakdown of skin L97.521 Non-pressure chronic ulcer of other part of left foot limited to breakdown of skin Physician Procedures CPT4 Code Description: [id number] - WC PHYS SUBQ TISS 20 SQ CM ICD-10 Diagnosis Description L97.511 Non-pressure chronic ulcer of other part of right foot lim L97.521 Non-pressure chronic ulcer of other part of left foot limi Modifier: ited to breakd ted to breakdo Quantity: 1 own of skin wn of skin Electronic Signature(s) Signed: 07/01/2018 2:21:53 PM By: Nancy Ham MD Entered By: [name] on 07/01/2018 14:19:58

## 2019-02-19 IMAGING — US IR FLUORO GUIDE CV LINE*R*
1 series · 2 of 2 positions shown · non-contrast
Comparison: none

INDICATION: Poor venous access. In need of durable intravenous access for
antibiotic administration.

[Series 1: ir fluoro guide cv line*right* · 2 of 2 slices shown]
[im 1/2]
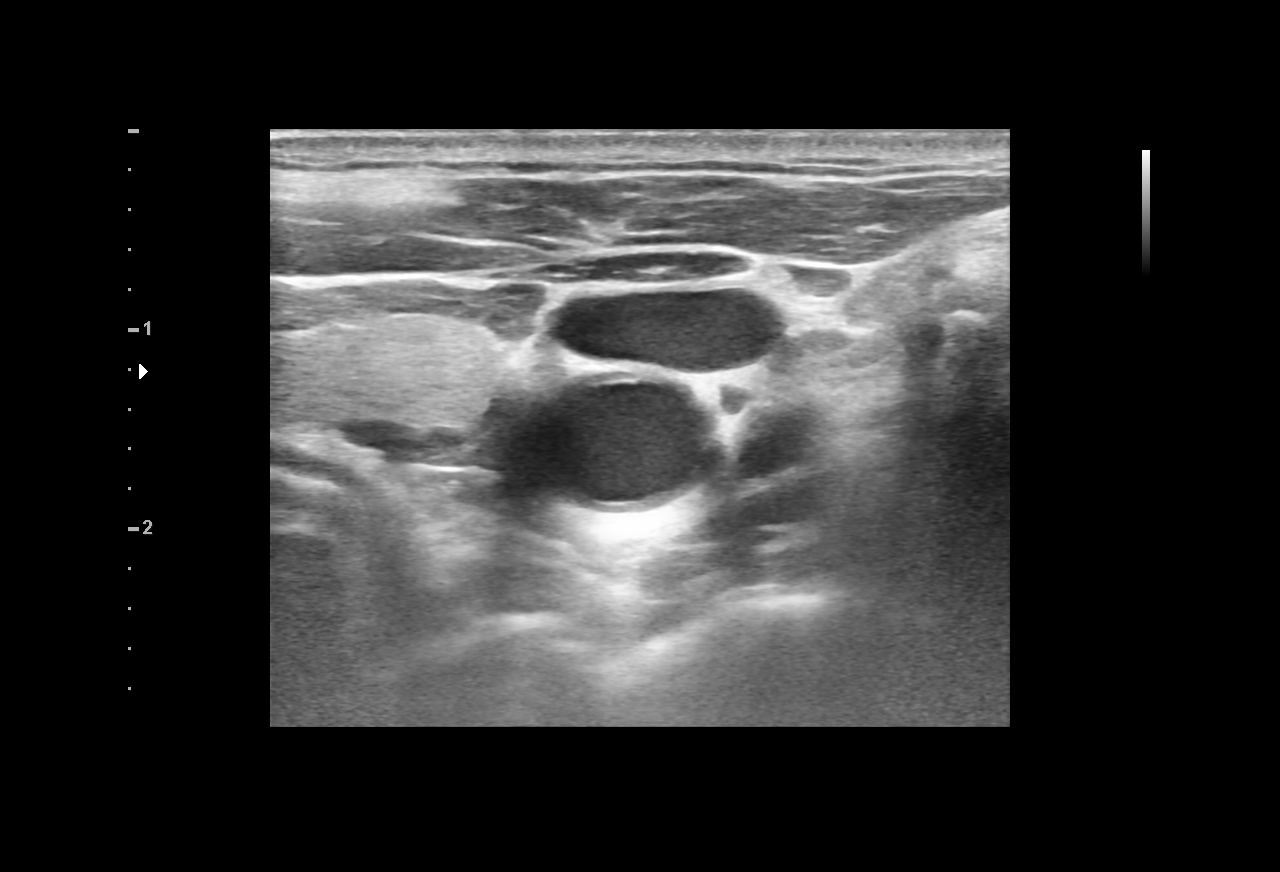
[im 2/2]
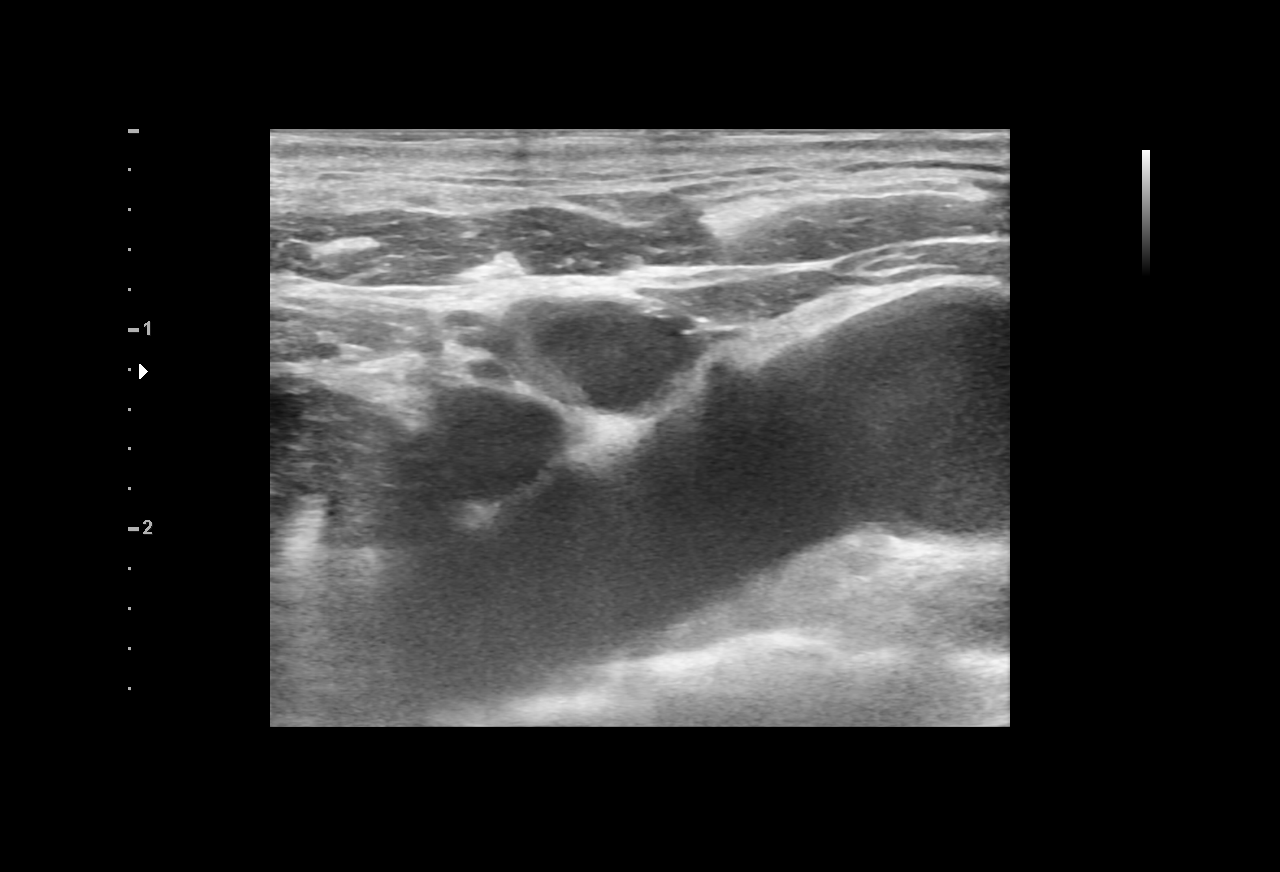

[2 of 2 positions shown; findings below may reference images not displayed]

Per patient report, she has experienced DVTs with previous bilateral
upper extremity approach PICC lines. Additionally patient has
history of chronic renal insufficiency as such, request is made for
placement of a tunneled central venous catheter.

EXAM:
TUNNELED PICC LINE WITH ULTRASOUND AND FLUOROSCOPIC GUIDANCE

MEDICATIONS:
None

ANESTHESIA/SEDATION:
None

FLUOROSCOPY TIME:  30 seconds (1.1 mGy)

COMPLICATIONS:
None immediate.



After the overlying soft tissues were anesthetized, a small venotomy
incision was created and a micropuncture kit was utilized to access
the internal jugular vein. Real-time ultrasound guidance was
utilized for vascular access including the acquisition of a
permanent ultrasound image documenting patency of the accessed
vessel. The microwire was utilized to measure appropriate catheter
length. The micropuncture sheath was exchanged for a peel-away
sheath over a guidewire. A 5 French single lumen tunneled central
venous catheter measuring 20 cm was tunneled in a retrograde fashion
from the anterior chest wall to the venotomy incision.

The catheter was then placed through the peel-away sheath with tip
ultimately positioned at the superior caval-atrial junction. Final
catheter positioning was confirmed and documented with a spot
radiographic image. The catheter aspirates and flushes normally. The
catheter was flushed with appropriate volume heparin dwells.

The catheter exit site was secured with a 0-Prolene retention
suture. The venotomy incision was closed with Dermabond and
[place]. Dressings were applied. The patient tolerated the
procedure well without immediate post procedural complication.
FINDINGS: After catheter placement, the tip lies within the superior
cavoatrial junction. The catheter aspirates and flushes normally and
is ready for immediate use.
IMPRESSION: Successful placement of 20 cm single lumen tunneled central venous
catheter via the right internal jugular vein with tip terminating at
the superior caval atrial junction. The catheter is ready for
immediate use.

## 2019-02-19 NOTE — Progress Notes (Signed)
[name], [name] ([id number]) Visit Report for [name] Arrival Information Details Patient Name: Date of Service: [name], [name] [name] 1:30 [name] Medical Record Number:[id number] Patient Account Number: [id number] Date of Birth/Sex: Treating RN: 23-Feb-1971 (48 y.o. F) [name], [name]: [name] Other Clinician: Referring [name]: Treating [name]:[name], [name], [name]: 23 Visit Information History Since Last Visit Added or deleted any medications: No Patient Arrived: Wheel Chair Any new allergies or adverse reactions: No Arrival Time: 13:26 Had a fall or experienced change in No activities of daily living that may affect Accompanied [name]: self risk of falls: Transfer Assistance: None Signs or symptoms of abuse/neglect since last No Patient Identification Verified: Yes visito Secondary Verification Process Completed: Yes Hospitalized since last visit: No Patient Requires Transmission-Based No Implantable device outside of the clinic excluding No Precautions: cellular tissue based products placed in the center Patient Has Alerts: No since last visit: Has Dressing in Place as Prescribed: Yes Pain Present Now: No Electronic Signature(s) Signed: 02/19/2019 6:05:31 [name] [name]: [name]: [name] 13:26:43 -------------------------------------------------------------------------------- Clinic Level of Care Assessment Details Patient Name: Date of Service: [name], [name] [name] 1:30 [name] Medical Record Number:[id number] Patient Account Number: [id number] Date of Birth/Sex: Treating RN: 1970/05/01 (48 y.o. [name] Primary Care Januel Doolan: [name] Other Clinician: Referring Hobie Kohles: Treating Ludean Duhart/Extender:[name], [name], [name]: 23 Clinic Level of Care Assessment Items TOOL 4 Quantity Score X - Use when only  an EandM is performed on FOLLOW-UP visit 1 0 ASSESSMENTS - Nursing Assessment / Reassessment X - Reassessment of Co-morbidities (includes updates in patient status) 1 10 X - Reassessment of Adherence to [name] Plan 1 5 ASSESSMENTS - Wound and Skin Assessment / Reassessment []  - Simple Wound Assessment / Reassessment - one wound 0 X - Complex Wound Assessment / Reassessment - multiple wounds 4 5 []  - Dermatologic / Skin Assessment (not related to wound area) 0 ASSESSMENTS - Focused Assessment []  - Circumferential Edema Measurements - multi extremities 0 []  - Nutritional Assessment / Counseling / Intervention 0 X - Lower Extremity Assessment (monofilament, tuning fork, pulses) 1 5 []  - Peripheral Arterial Disease Assessment (using hand held doppler) 0 ASSESSMENTS - Ostomy and/or Continence Assessment and Care []  - Incontinence Assessment and Management 0 []  - Ostomy Care Assessment and Management (repouching, etc.) 0 PROCESS - Coordination of Care X - Simple Patient / Family Education for ongoing care 1 15 []  - Complex (extensive) Patient / Family Education for ongoing care 0 X - Staff obtains Programmer, systems, Records, Test Results / Process Orders 1 10 X - Staff telephones HHA, Nursing Homes / Clarify orders / etc 1 10 []  - Routine Transfer to another Facility (non-emergent condition) 0 []  - Routine Hospital Admission (non-emergent condition) 0 []  - New Admissions / Biomedical engineer / Ordering NPWT, Apligraf, etc. 0 []  - Emergency Hospital Admission (emergent condition) 0 X - Simple Discharge Coordination 1 10 []  - Complex (extensive) Discharge Coordination 0 PROCESS - Special Needs []  - Pediatric / Minor Patient Management 0 []  - Isolation Patient Management 0 []  - Hearing / Language / Visual special needs 0 []  - Assessment of Community assistance (transportation, D/C planning, etc.) 0 []  - Additional assistance / Altered mentation 0 []  - Support Surface(s) Assessment (bed,  cushion, seat, etc.) 0 INTERVENTIONS - Wound Cleansing / Measurement []  - Simple Wound Cleansing - one wound 0 X - Complex Wound Cleansing - multiple wounds 4 5 X - Wound  Imaging (photographs - any number of wounds) 1 5 []  - Wound Tracing (instead of photographs) 0 []  - Simple Wound Measurement - one wound 0 X - Complex Wound Measurement - multiple wounds 4 5 INTERVENTIONS - Wound Dressings []  - Small Wound Dressing one or multiple wounds 0 X - Medium Wound Dressing one or multiple wounds 1 15 []  - Large Wound Dressing one or multiple wounds 0 X - Application of Medications - topical 1 5 []  - Application of Medications - injection 0 INTERVENTIONS - Miscellaneous []  - External ear exam 0 []  - Specimen Collection (cultures, biopsies, blood, body fluids, etc.) 0 []  - Specimen(s) / Culture(s) sent or taken to Lab for analysis 0 []  - Patient Transfer (multiple staff / Civil Service fast streamer / Similar devices) 0 []  - Simple Staple / Suture removal (25 or less) 0 []  - Complex Staple / Suture removal (26 or more) 0 []  - Hypo / Hyperglycemic Management (close monitor of Blood Glucose) 0 []  - Ankle / Brachial Index (ABI) - do not check if billed separately 0 X - Vital Signs 1 5 Has the patient been seen at the hospital within the last three years: Yes Total Score: 155 Level Of Care: New/Established - Level 4 Electronic Signature(s) Signed: 02/16/2019 5:59:11 [name] [name]: [name] Entered [name]: [name] on [name] 14:22:12 -------------------------------------------------------------------------------- Encounter Discharge Information Details Patient Name: Date of Service: [name] [name] 1:30 [name] Medical Record Number:[id number] Patient Account Number: [id number] Date of Birth/Sex: Treating RN: [birth year] (48 y.o. [name] Primary Care Feliberto Stockley: [name] Other Clinician: Referring Kiyana Vazguez: Treating Caedyn Tassinari/Extender:[name], [name], Nancy Weeks in  [name]: 23 Encounter Discharge Information Items Discharge Condition: Stable Ambulatory Status: Wheelchair Discharge Destination: Home Transportation: Private Auto Accompanied [name]: self Schedule Follow-up Appointment: Yes Clinical Summary of Care: Electronic Signature(s) Signed: 02/16/2019 5:37:24 [name] [name]: Deon Pilling Entered [name]: Deon Pilling on [name] 14:35:53 -------------------------------------------------------------------------------- Lower Extremity Assessment Details Patient Name: Date of Service: [name], [name] [name] 1:30 [name] Medical Record Number:[id number] Patient Account Number: [id number] Date of Birth/Sex: Treating RN: [birth year] (48 y.o. [name] Primary Care Gearl Kimbrough: [name] Other Clinician: Referring Johnnie Goynes: Treating Cheick Suhr/Extender:[name], [name], [name]: 23 Edema Assessment Assessed: [Left: No] [Right: No] Edema: [Left: Ye] [Right: s] Calf Left: Right: Point of Measurement: cm From Medial Instep cm 27 cm Ankle Left: Right: Point of Measurement: cm From Medial Instep cm 21.4 cm Vascular Assessment Pulses: Dorsalis Pedis Palpable: [Right:Yes] Electronic Signature(s) Signed: 02/19/2019 6:05:31 [name] [name]: [name]: [name] 13:37:00 -------------------------------------------------------------------------------- Multi Wound Chart Details Patient Name: Date of Service: [name] [name] 1:30 [name] Medical Record Number:[id number] Patient Account Number: [id number] Date of Birth/Sex: Treating RN: [birth year] (48 y.o. [name] Primary Care Jurrell Royster: [name] Other Clinician: Referring Syrus Nakama: Treating Ellouise Mcwhirter/Extender:[name], [name], [name]: 23 Vital Signs Height(in): 33 Capillary Blood 149 Glucose(mg/dl): Weight(lbs): 125 Pulse(bpm): 30 Body Mass Index(BMI): 20 Blood Pressure(mmHg):  167/76 Temperature(F): 98.7 Respiratory 17 Rate(breaths/min): Photos: [43:No Photos] [44:No Photos] [46:No Photos] Wound Location: [43:Right Foot - Medial] [44:Right Calcaneus] [46:Right Lower Leg - Lateral] Wounding Event: [43:Gradually Appeared] [44:Gradually Appeared] [46:Other Lesion] 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] [44:Lower Extremity] Comorbid History: [43:Cataracts, Chronic sinus Cataracts, Chronic sinus Cataracts, Chronic sinus problems/congestion, Anemia, Sleep Apnea, Deep Anemia, Sleep Apnea, Deep Anemia, Sleep Apnea, Deep Vein Thrombosis, Hypertension, Peripheral Hypertension,  Peripheral Hypertension, Peripheral Arterial Disease, Type I Diabetes, Osteoarthritis, Osteomyelitis,  Neuropathy, Osteomyelitis, Neuropathy, Osteomyelitis, Neuropathy, Seizure Disorder] [44:problems/congestion, Vein Thrombosis, Arterial Disease, Type I  Diabetes, Osteoarthritis, Seizure Disorder] [46:problems/congestion, Vein Thrombosis, Arterial Disease, Type I Diabetes, Osteoarthritis, Seizure Disorder] Date Acquired: [43:08/04/2018] [44:08/28/2018] [46:12/25/2018] Weeks of [name]: [43:23] [44:23] [46:7] Wound Status: [43:Open] [44:Open] [46:Open] Clustered Wound: [43:Yes] [44:No] [46:Yes] Clustered Quantity: [43:1] [44:N/A] [46:2] Measurements L x W x D 5.8x3.5x1.6 [44:1.1x1.1x0.1] [46:1.2x1.5x2.5] (cm) Area (cm) : [43:15.944] [44:0.95] [46:1.414] Volume (cm) : [43:25.51] [44:0.095] [46:3.534] % Reduction in Area: [43:78.00%] [44:62.20%] [46:-1689.90%] % Reduction in Volume: -17.20% [44:62.20%] [46:-4373.40%] Starting Position 1 [46:9] (o'clock): Ending Position 1 [46:7] (o'clock): Maximum Distance 1 [46:4.7] (cm): Undermining: [43:No] [44:No] [46:Yes] Classification: [43:Grade 4] [44:Grade 2] [46:Full Thickness Without Exposed Support Structures] Exudate Amount: [43:Large] [44:None Present] [46:Large] Exudate Type:  [43:Purulent] [44:N/A] [46:Purulent] Exudate Color: [43:yellow, brown, green] [44:N/A] [46:yellow, brown, green] Wound Margin: [43:Well defined, not attached] [44:Flat and Intact] [46:Well defined, not attached] Granulation Amount: [43:Medium (34-66%)] [44:Medium (34-66%)] [46:Medium (34-66%)] Granulation Quality: [43:Pink] [44:Pink] [46:Red, Pink] Necrotic Amount: [43:Medium (34-66%)] [44:Medium (34-66%)] [46:Medium (34-66%)] Necrotic Tissue: [43:Adherent Slough] [44:Eschar] [46:Adherent Slough] Exposed Structures: [43:Fat Layer (Subcutaneous Tissue) Exposed: Yes Tendon: Yes Muscle: Yes Fascia: No Joint: No Bone: No] [44:Fat Layer (Subcutaneous Tissue) Exposed: Yes Fascia: No Tendon: No Muscle: No Joint: No Bone: No] [46:Fat Layer (Subcutaneous Tissue) Exposed: Yes  Fascia: No Tendon: No Muscle: No Joint: No Bone: No] Epithelialization: [43:None] [44:Small (1-33%) 47 N/A] [46:None N/A] Photos: [43:No Photos] [44:N/A] [46:N/A] Wound Location: [43:Right Lower Leg - Anterior N/A] [46:N/A] Wounding Event: [43:Gradually Appeared] [44:N/A] [46:N/A] Primary Etiology: [43:Abscess] [44:N/A] [46:N/A] Comorbid History: [43:Cataracts, Chronic sinus N/A problems/congestion, Anemia, Sleep Apnea, Deep Vein Thrombosis, Hypertension, Peripheral Arterial Disease, Type I Diabetes, Osteoarthritis, Osteomyelitis, Neuropathy, Seizure Disorder] [46:N/A] Date Acquired: [43:01/31/2019] [44:N/A] [46:N/A] Weeks of [name]: [43:2] [44:N/A] [46:N/A] Wound Status: [43:Open] [44:N/A] [46:N/A] Clustered Wound: [43:No] [44:N/A] [46:N/A] Clustered Quantity: [43:N/A] [44:N/A] [46:N/A] Measurements L x W x D 0.5x0.7x0.1 [44:N/A] [46:N/A] (cm) Area (cm) : [43:0.275] [44:N/A] [46:N/A] Volume (cm) : [76:1.950] [44:N/A] [46:N/A] % Reduction in Area: [43:37.50%] [44:N/A] [46:N/A] % Reduction in Volume: 38.60% [44:N/A] [46:N/A] Undermining: [43:No] [44:N/A] [46:N/A] Classification: [43:Full Thickness Without Exposed Support  Structures] [44:N/A] [46:N/A] Exudate Amount: [43:None Present] [44:N/A] [46:N/A] Exudate Type: [43:N/A] [44:N/A] [46:N/A] Exudate Color: [43:N/A] [44:N/A] [46:N/A] Wound Margin: [43:Distinct, outline attached N/A] [46:N/A] Granulation Amount: [43:Large (67-100%)] [44:N/A] [46:N/A] Granulation Quality: [43:Pink] [44:N/A] [46:N/A] Necrotic Amount: [43:None Present (0%)] [44:N/A] [46:N/A] Necrotic Tissue: [43:N/A] [44:N/A] [46:N/A] Exposed Structures: [43:Fat Layer (Subcutaneous N/A Tissue) Exposed: Yes Fascia: No Tendon: No Muscle: No Joint: No Bone: No None] [44:N/A] [46:N/A N/A] [name] Notes Electronic Signature(s) Signed: 02/16/2019 5:46:17 [name] [name]: Linton Ham MD Signed: 02/16/2019 5:59:11 [name] [name]: [name] Entered [name]: Linton Ham on [name] 14:28:20 -------------------------------------------------------------------------------- Multi-Disciplinary Care Plan Details Patient Name: Date of Service: [name]. [name] 1:30 [name] Medical Record Number:[id number] Patient Account Number: [id number] Date of Birth/Sex: Treating RN: 1971/01/25 (48 y.o. [name] Primary Care Arella Blinder: [name] Other Clinician: Referring Lavren Lewan: Treating Kalkidan Caudell/Extender:[name], [name], [name]: 23 Active Inactive Wound/Skin Impairment Nursing Diagnoses: Impaired tissue integrity Knowledge deficit related to ulceration/compromised skin integrity Goals: Patient/caregiver will verbalize understanding of skin care regimen Date Initiated: 09/03/2018 Target Resolution Date: 02/27/2019 Goal Status: Active Ulcer/skin breakdown will have a volume reduction of 30% [name] week 4 Date Initiated: 09/03/2018 Date Inactivated: 10/07/2018 Target Resolution Date: 10/01/2018 Unmet Goal Status: Unmet Reason: COMORBITIES Ulcer/skin breakdown will have a volume reduction of 50% [name] week 8 Date Initiated: 10/07/2018 Date Inactivated:  11/03/2018 Target  Resolution Date: 10/31/2018 Unmet Goal Status: Unmet Reason: Osteomyelitis Interventions: Assess patient/caregiver ability to obtain necessary supplies Assess patient/caregiver ability to perform ulcer/skin care regimen upon admission and as needed Assess ulceration(s) every visit Provide education on ulcer and skin care [name] Activities: Skin care regimen initiated : 09/03/2018 Topical wound management initiated : 09/03/2018 Notes: Electronic Signature(s) Signed: 02/16/2019 5:59:11 [name] [name]: [name] Entered [name]: [name] on [name] 13:14:55 -------------------------------------------------------------------------------- Pain Assessment Details Patient Name: Date of Service: [name] [name] 1:30 [name] Medical Record Number:[id number] Patient Account Number: [id number] Date of Birth/Sex: Treating RN: 01/01/1971 (48 y.o. [name] Primary Care Taneah Masri: [name] Other Clinician: Referring Khyron Garno: Treating Lin Glazier/Extender:[name], [name], [name]: 23 Active Problems Location of Pain Severity and Description of Pain Patient Has Paino No Site Locations Pain Management and Medication Current Pain Management: Electronic Signature(s) Signed: 02/19/2019 6:05:31 [name] [name]: [name]: [name] 13:29:58 -------------------------------------------------------------------------------- Patient/Caregiver Education Details Patient Name: Date of Service: [name] 11/16/2020andnbsp1:30 [name] Medical Record Patient Account Number: [id number] Number: Treating RN: [name] Date of Birth/Gender: 21-Mar-1971 (48 y.o. F) Other Clinician: Primary Care Physician: Katharine Look Referring Physician: Physician/Extender: Juanito Doom in [name]: 12 Education Assessment Education Provided To: Patient Education Topics  Provided Wound/Skin Impairment: Methods: Explain/Verbal Responses: State content correctly Electronic Signature(s) Signed: 02/16/2019 5:59:11 [name] [name]: [name] Entered [name]: [name] on [name] 13:15:07 -------------------------------------------------------------------------------- Wound Assessment Details Patient Name: Date of Service: [name] [name] 1:30 [name] Medical Record Number:[id number] Patient Account Number: [id number] Date of Birth/Sex: Treating RN: 10-Nov-1970 (48 y.o. [name] Primary Care Dejon Lukas: [name] Other Clinician: Referring Ghada Abbett: Treating Delmo Matty/Extender:[name], [name], [name]: 23 Wound Status Wound Number: 43 Primary Diabetic Wound/Ulcer of the Lower Extremity Etiology: Wound Location: Right Foot - Medial Wound Open Wounding Event: Gradually Appeared Status: Date Acquired: 08/04/2018 Comorbid Cataracts, Chronic sinus problems/congestion, Weeks Of [name]: 23 History: Anemia, Sleep Apnea, Deep Vein Thrombosis, Clustered Wound: Yes Hypertension, Peripheral Arterial Disease, Type I Diabetes, Osteoarthritis, Osteomyelitis, Neuropathy, Seizure Disorder Wound Measurements Length: (cm) 5.8 Width: (cm) 3.5 Depth: (cm) 1.6 Clustered Quantity: 1 Area: (cm) 15.944 Volume: (cm) 25.51 Wound Description Classification: Grade 4 Wound Margin: Well defined, not attached Exudate Amount: Large Exudate Type: Purulent Exudate Color: yellow, brown, green Wound Bed Granulation Amount: Medium (34-66%) Granulation Quality: Pink Necrotic Amount: Medium (34-66%) Necrotic Quality: Adherent Slough After Cleansing: No brino Yes Exposed Structure posed: No (Subcutaneous Tissue) Exposed: Yes posed: Yes posed: Yes osis of Muscle: Yes osed: No sed: No % Reduction in Area: 78% % Reduction in Volume: -17.2% Epithelialization: None Tunneling: No Undermining: No Foul  Odor Slough/Fi Fascia Ex Fat Layer Tendon Ex Muscle Ex Necr Joint Exp Bone Expo [name] Notes Wound #43 (Right, Medial Foot) 1. Cleanse With Wound Cleanser Soap and water 3. Primary Dressing Applied Calcium Alginate Ag 4. Secondary Dressing ABD Pad Dry [name] Roll [name] 5. Secured With [facility]) Signed: 02/19/2019 6:05:31 [name] [name]: [name]: [name] 13:40:16 -------------------------------------------------------------------------------- Wound Assessment Details Patient Name: Date of Service: [name], [name] [name] 1:30 [name] Medical Record Number:[id number] Patient Account Number: [id number] Date of Birth/Sex: Treating RN: 08-14-1970 (48 y.o. [name] Primary Care Vinh Sachs: [name] Other Clinician: Referring Jade Burright: Treating Rhyen Mazariego/Extender:[name], [name], [name]: 23 Wound Status Wound Number: 44 Primary Diabetic Wound/Ulcer of the Lower Extremity Etiology: Wound  Location: Right Calcaneus Wound Open Wounding Event: Gradually Appeared Status: Date Acquired: 08/28/2018 Comorbid Cataracts, Chronic sinus problems/congestion, Weeks Of [name]: 23 History: Anemia, Sleep Apnea, Deep Vein Thrombosis, Clustered Wound: No Hypertension, Peripheral Arterial Disease, Type I Diabetes, Osteoarthritis, Osteomyelitis, Neuropathy, Seizure Disorder Wound Measurements Length: (cm) 1.1 % Reduction Width: (cm) 1.1 % Reduction Depth: (cm) 0.1 Epitheliali Area: (cm) 0.95 Tunneling: Volume: (cm) 0.095 Underminin Wound Description Classification: Grade 2 Wound Margin: Flat and Intact Exudate Amount: None Present Wound Bed Granulation Amount: Medium (34-66%) Granulation Quality: Pink Necrotic Amount: Medium (34-66%) Necrotic Quality: Eschar Foul Odor After Cleansing: No Slough/Fibrino Yes Exposed Structure Fascia Exposed: No Fat Layer  (Subcutaneous Tissue) Exposed: Yes Tendon Exposed: No Muscle Exposed: No Joint Exposed: No Bone Exposed: No in Area: 62.2% in Volume: 62.2% zation: Small (1-33%) No g: No [name] Notes Wound #44 (Right Calcaneus) 1. Cleanse With Wound Cleanser Soap and water 3. Primary Dressing Applied Calcium Alginate Ag 4. Secondary Dressing ABD Pad Dry [name] Roll [name] 5. Secured With [facility]) Signed: 02/19/2019 6:05:31 [name] [name]: [name]: [name] 13:40:44 -------------------------------------------------------------------------------- Wound Assessment Details Patient Name: Date of Service: [name] [name] 1:30 [name] Medical Record Number:[id number] Patient Account Number: [id number] Date of Birth/Sex: Treating RN: [birth year] (48 y.o. [name] Primary Care Kimm Ungaro: [name] Other Clinician: Referring Leighann Amadon: Treating Deeanna Beightol/Extender:[name], [name], [name]: 23 Wound Status Wound Number: 46 Primary Abscess Etiology: Etiology: Wound Location: Right Lower Leg - Lateral Wound Open Wounding Event: Other Lesion Status: Date Acquired: 12/25/2018 Notes: MD IandD today. Weeks Of [name]: 7 Comorbid Cataracts, Chronic sinus problems/congestion, Clustered Wound: Yes History: Anemia, Sleep Apnea, Deep Vein Thrombosis, Hypertension, Peripheral Arterial Disease, Type I Diabetes, Osteoarthritis, Osteomyelitis, Neuropathy, Seizure Disorder Wound Measurements Length: (cm) 1.2 Width: (cm) 1.5 Depth: (cm) 2.5 Clustered Quantity: 2 Area: (cm) 1.414 Volume: (cm) 3.534 % Reduction in Area: -1689.9% % Reduction in Volume: -4373.4% Epithelialization: None Tunneling: No Undermining: Yes Starting Position (o'clock): 9 Ending Position (o'clock): 7 Maximum Distance: (cm) 4.7 Wound Description Full Thickness Without Exposed Support Foul  Odo Classification: Structures Slough/F Wound Well defined, not attached Margin: Exudate Large Amount: Exudate Purulent Type: Exudate yellow, brown, green Color: Wound Bed Granulation Amount: Medium (34-66%) Granulation Quality: Red, Pink Fascia E Necrotic Amount: Medium (34-66%) Fat Laye Necrotic Quality: Adherent Slough Tendon E Muscle E Joint Ex Bone Exp r After Cleansing: No ibrino Yes Exposed Structure xposed: No r (Subcutaneous Tissue) Exposed: Yes xposed: No xposed: No posed: No osed: No [name] Notes Wound #46 (Right, Lateral Lower Leg) 1. Cleanse With Wound Cleanser Soap and water 3. Primary Dressing Applied Calcium Alginate Ag 4. Secondary Dressing ABD Pad Dry [name] Roll [name] 5. Secured With [facility]) Signed: 02/19/2019 6:05:31 [name] [name]: [name]: [name] 13:41:33 -------------------------------------------------------------------------------- Wound Assessment Details Patient Name: Date of Service: [name] [name] 1:30 [name] Medical Record Number:[id number] Patient Account Number: [id number] Date of Birth/Sex: Treating RN: [birth year] (48 y.o. [name] Primary Care Nykerria Macconnell: [name] Other Clinician: Referring Sherida Dobkins: Treating Nickcole Bralley/Extender:[name], [name], [name]: 23 Wound Status Wound Number: 47 Primary Abscess Etiology: Wound Location: Right Lower Leg - Anterior Wound Open Wounding Event: Gradually Appeared Status: Date Acquired: 01/31/2019 Comorbid Cataracts, Chronic sinus problems/congestion, Weeks Of [name]: 2 History: Anemia, Sleep Apnea, Deep Vein Thrombosis, Clustered Wound: No Hypertension, Peripheral Arterial Disease, Type I Diabetes, Osteoarthritis, Osteomyelitis, Neuropathy, Seizure Disorder Wound Measurements Length: (cm) 0.5 %  Reduct Width: (cm) 0.7 % Reduct Depth: (cm) 0.1  Epitheli Area: (cm) 0.275 Tunneli Volume: (cm) 0.027 Undermi Wound Description Full Thickness Without Exposed Support Foul Odo Classification: Structures Slough/F Wound Distinct, outline attached Margin: Exudate None Present Amount: Wound Bed Granulation Amount: Large (67-100%) Granulation Quality: Pink Fascia E Necrotic Amount: None Present (0%) Fat Laye Tendon E Muscle E Joint Ex Bone Exp r After Cleansing: No ibrino No Exposed Structure xposed: No r (Subcutaneous Tissue) Exposed: Yes xposed: No xposed: No posed: No osed: No ion in Area: 37.5% ion in Volume: 38.6% alization: None ng: No ning: No [name] Notes Wound #47 (Right, Anterior Lower Leg) 1. Cleanse With Wound Cleanser Soap and water 3. Primary Dressing Applied Calcium Alginate Ag 4. Secondary Dressing ABD Pad Dry [name] Roll [name] 5. Secured With [facility]) Signed: 02/19/2019 6:05:31 [name] [name]: [name]: [name] 13:42:01 -------------------------------------------------------------------------------- Vitals Details Patient Name: Date of Service: [name] [name] 1:30 [name] Medical Record Number:[id number] Patient Account Number: [id number] Date of Birth/Sex: Treating RN: [birth year] (48 y.o. [name] Primary Care Nelia Rogoff: [name] Other Clinician: Referring Eligio Angert: Treating Elius Etheredge/Extender:[name], [name], [name]: 23 Vital Signs Time Taken: 13:25 Temperature (F): 98.7 Height (in): 67 Pulse (bpm): 93 Weight (lbs): 125 Respiratory Rate (breaths/min): 17 Body Mass Index (BMI): 19.6 Blood Pressure (mmHg): 167/76 Capillary Blood Glucose (mg/dl): 149 Reference Range: 80 - 120 mg / dl Notes patient reported CBG of 149 this morning Electronic Signature(s) Signed: 02/19/2019 6:05:31 [name] [name]: [name]: [name] 13:29:51

## 2019-02-23 ENCOUNTER — Ambulatory Visit (HOSPITAL_BASED_OUTPATIENT_CLINIC_OR_DEPARTMENT_OTHER): Payer: Medicare HMO | Admitting: Internal Medicine

## 2019-02-24 ENCOUNTER — Ambulatory Visit (INDEPENDENT_AMBULATORY_CARE_PROVIDER_SITE_OTHER): Payer: Medicare HMO | Admitting: Endocrinology

## 2019-02-24 ENCOUNTER — Encounter (HOSPITAL_BASED_OUTPATIENT_CLINIC_OR_DEPARTMENT_OTHER): Payer: Medicare HMO | Admitting: Physician Assistant

## 2019-02-24 ENCOUNTER — Encounter: Payer: Self-pay | Admitting: Endocrinology

## 2019-02-24 ENCOUNTER — Ambulatory Visit: Payer: Medicare HMO | Admitting: Endocrinology

## 2019-02-24 ENCOUNTER — Other Ambulatory Visit: Payer: Self-pay

## 2019-02-24 VITALS — BP 148/82 | HR 90 | Temp 98.4°F | Ht 67.0 in | Wt 124.4 lb

## 2019-02-24 DIAGNOSIS — E1065 Type 1 diabetes mellitus with hyperglycemia: Secondary | ICD-10-CM

## 2019-02-24 DIAGNOSIS — E10621 Type 1 diabetes mellitus with foot ulcer: Secondary | ICD-10-CM | POA: Diagnosis not present

## 2019-02-24 NOTE — Progress Notes (Signed)
Patient ID: [name], female   DOB: 12/13/1970, 48 y.o.   MRN: [id number]   Reason for Appointment: follow-up of diabetes  History of Present Illness   Diagnosis: Type 1 DIABETES MELITUS  Previous history: She has had long-standing poorly controlled diabetes and typically has poor compliance with self care measures despite periodic diabetes education and periodic followup  INSULIN PUMP regimen:   Currently using MEDTRONIC 723 model   Basal rates: Midnight to 6 AM = 0.5, 6 AM-11 AM: 0.55, 11 AM-1 PM = 0.45, 1 PM-3 PM = 0.85, 3 PM-12 AM = 0.75    Carb coverage 1: 15 g and correction 1: 70 during the day and 90 from 12 AM -8 AM Glucose target 150, active insulin 4 hours         Recent history:   Her last A1c was 11.4 and in 11/20 is 9.3  Current blood sugar patterns, difficulties with management and problems identified:   She continues to have poor control although A1c is improved as above  Blood sugar readings reviewed both on her pump and her meter, she has more readings on her meter than on the pump  She is however checking more readings later in the evening and after supper  Readings are THE lowest usually midday and probably more on the day she is going to dialysis when she comes back home  HIGHEST blood sugars are late at night  This is likely to be from her checking her sugars after eating and then bolusing  She is sometimes eating a large amount of carbohydrate  On her last visit carb ratio was changed to 1: 15 instead of 20  She is usually not trying to suspend her pump as before  As before she does not always check her sugar at the time of her meal and not entering blood sugar with her meal bolus  She thinks that she is not getting enough to cover her high readings when they are over 5 or 600 at night likely from her sensitivity being only 1: 90  Average blood sugar is mostly over 300 except in the afternoons  HYPOGLYCEMIA has been only  rare with a reading of 64 around 7:30 PM  Blood sugar readings from Contour download: Checking blood sugars 4 times a day  PRE-MEAL  morning  midday  evening  midnight-6 AM Overall  Glucose range:  169-590  95-398  119-524  82-600+   Mean/median:  369  330  250  AC 384, PC 358  351   POST-MEAL PC Breakfast PC Lunch  6 PM +  Glucose range:    64-600  Mean/median:    400   Previous readings:  PRE-MEAL  morning Lunch  6 PM-12 AM  12 AM-6 AM Overall  Glucose range:  62-597  71-600+  68-600+  176-600+   Mean/median:  285   300+     POST-MEAL PC Breakfast  afternoon PC Dinner  Glucose range:   59-564   Mean/median:   265      Meals: eating at variable times and also having snacks periodically.  Her daily carbohydrate intake is quite variable Physical activity: exercise: none         Dietician visit: Most recent:?          Complications: are: Neuropathy, nephropathy, diabetic foot ulcer, amputation    Wt Readings from Last 3 Encounters:  02/24/19 124 lb 6.4 oz (56.4 kg)  02/04/19 121 lb (54.9 kg)  12/03/18 119 lb 6.4 oz (54.2 kg)   Lab Results  Component Value Date   HGBA1C 9.3 (A) 02/04/2019   HGBA1C 11.4 (A) 10/15/2018   HGBA1C 10.4 (H) 07/30/2018   Lab Results  Component Value Date   MICROALBUR 304.2 (H) 11/11/2017   LDLCALC 78 05/02/2016   CREATININE 3.81 (H) 08/08/2018   No visits with results within 1 Week(s) from this visit.  Latest known visit with results is:  Erroneous Encounter on 02/04/2019  Component Date Value Ref Range Status  . Hemoglobin A1C 02/04/2019 9.3* 4.0 - 5.6 % Final    Other problems addressed:: See review of systems   Allergies as of 02/24/2019      Reactions   Cleocin [clindamycin Hcl] Diarrhea   Lisinopril Other (See Comments)   Elevated potassium per pt report   Amoxicillin Diarrhea, Nausea Only, Other (See Comments)   Has patient had a PCN reaction causing immediate rash, facial/tongue/throat swelling, SOB or lightheadedness with  hypotension: No Has patient had a PCN reaction causing severe rash involving mucus membranes or skin necrosis: No Has patient had a PCN reaction that required hospitalization: No Has patient had a PCN reaction occurring within the last 10 years: No If all of the above answers are "NO", then may proceed with Cephalosporin use.   Bactrim [sulfamethoxazole-trimethoprim] Diarrhea, Nausea Only      Medication List       Accurate as of February 24, 2019 11:42 AM. If you have any questions, ask your nurse or doctor.        albuterol 108 (90 Base) MCG/ACT inhaler Commonly known as: VENTOLIN HFA Inhale 2 puffs into the lungs every 6 (six) hours as needed for wheezing or shortness of breath. Reported on 04/15/2015   amLODipine 5 MG tablet Commonly known as: NORVASC Take 1 tablet (5 mg total) by mouth daily.   aspirin 81 MG tablet Take 81 mg by mouth every evening.   calcitRIOL 0.5 MCG capsule Commonly known as: ROCALTROL Take 1 capsule daily   calcium acetate 667 MG capsule Commonly known as: PHOSLO Take 2 capsules (1,334 mg total) by mouth 3 (three) times daily with meals.   CINNAMON PO Take 1 tablet by mouth every evening.   Contour Next Test test strip Generic drug: glucose blood USE AS INSTRUCTED TO CHECK BLOOD SUGAR 4 TIMES A DAY. DX Z61.09   folic acid 1 MG tablet Commonly known as: FOLVITE Take 1 mg by mouth every evening.   insulin aspart 100 UNIT/ML injection Commonly known as: NovoLOG USE A MAX OF 65 UNITS DAILY VIA INSULIN PUMP.   linezolid 600 MG tablet Commonly known as: ZYVOX Take 600 mg by mouth 2 (two) times daily.   pravastatin 40 MG tablet Commonly known as: PRAVACHOL TAKE 1 TABLET BY MOUTH EVERY DAY   pyridoxine 100 MG tablet Commonly known as: B-6 Take 100 mg by mouth daily. Reported on 04/15/2015   TURMERIC PO Take 1 capsule by mouth daily.       Allergies:  Allergies  Allergen Reactions  . Cleocin [Clindamycin Hcl] Diarrhea  .  Lisinopril Other (See Comments)    Elevated potassium per pt report  . Amoxicillin Diarrhea, Nausea Only and Other (See Comments)    Has patient had a PCN reaction causing immediate rash, facial/tongue/throat swelling, SOB or lightheadedness with hypotension: No Has patient had a PCN reaction causing severe rash involving mucus membranes or skin necrosis: No Has patient had a PCN reaction that required hospitalization: No Has  patient had a PCN reaction occurring within the last 10 years: No If all of the above answers are "NO", then may proceed with Cephalosporin use.   . Bactrim [Sulfamethoxazole-Trimethoprim] Diarrhea and Nausea Only    Past Medical History:  Diagnosis Date  . Anemia   . Arthritis   . Bronchitis   . C. difficile diarrhea 09/26/2014  . Cataracts, both eyes   . Cellulitis of right foot 06/02/2014  . CKD (chronic kidney disease) stage 3, GFR 30-59 ml/min    Dialysis T, TH, Sat  . Diabetic ulcer of right foot ([place]) 06/02/2014  . DVT (deep venous thrombosis) ([place]) 10/2014   one in each one in leg- PICC line , one in right and left arm  . H/O seasonal allergies   . Heart murmur    told once when she was pregnant- no furter mention- was in notes 11/2015- had Echo 8 /4/17  . History of blood transfusion   . Hypercholesteremia   . Hypertension   . Left foot infection   . Neuropathy    feet  . Neuropathy in diabetes ([place])   . Peripheral vascular disease ([place])   . Sleep apnea    not able to use CPAP  . Staphylococcus aureus bacteremia 10/2014  . Type 1 diabetes ([place])    onset age 82    Past Surgical History:  Procedure Laterality Date  . AMPUTATION TOE Left 07/24/2015   5th toe and Hallux amputation  . AV FISTULA PLACEMENT Left 08/06/2018   Procedure: INSERTION OF BASCILIC VEN TRANSPOSITION 1ST STAGE;  Surgeon: Rosetta Posner, MD;  Location: [facility];  Service: Vascular;  Laterality: Left;  . CESAREAN SECTION    . EXOSTECTECTOMY TOE Left 04/11/2016   Procedure:  EXOSTECTECTOMY CUBOID AND 5TH METATARSAL AND PLACEMENT OF ANTIBIOTIC BEADS;  Surgeon: Edrick Kins, DPM;  Location: [facility];  Service: Podiatry;  Laterality: Left;  . EYE SURGERY Bilateral    [place]  . FEMORAL-POPLITEAL BYPASS GRAFT Left 05/03/2015  . FOOT AMPUTATION THROUGH METATARSAL Right 2016  . hemrrhoidectomy    . I&D EXTREMITY Right 06/10/2014   Procedure: IRRIGATION AND DEBRIDEMENT Right Foot;  Surgeon: Newt Minion, MD;  Location: [facility];  Service: Orthopedics;  Laterality: Right;  . I&D EXTREMITY Right 07/31/2018   Procedure: IRRIGATION AND DEBRIDEMENT EXTREMITY;  Surgeon: Edrick Kins, DPM;  Location: [facility];  Service: Podiatry;  Laterality: Right;  . I&D EXTREMITY Right 08/04/2018   Procedure: IRRIGATION AND DEBRIDEMENT FOOT with placement of wound vac;  Surgeon: Edrick Kins, DPM;  Location: [facility];  Service: Podiatry;  Laterality: Right;  . INCISION AND DRAINAGE Left 04/11/2016   Procedure: INCISION AND DRAINAGE A DEEP COMPLICATED WOUND OF LEFT FOOT;  Surgeon: Edrick Kins, DPM;  Location: [facility];  Service: Podiatry;  Laterality: Left;  . INSERTION OF DIALYSIS CATHETER Right 08/06/2018   Procedure: INSERTION OF TUNNELED DIALYSIS CATHETER;  Surgeon: Rosetta Posner, MD;  Location: [facility];  Service: Vascular;  Laterality: Right;  . IR FLUORO GUIDE CV LINE RIGHT  09/24/2017  . IR FLUORO GUIDE CV LINE RIGHT  08/01/2018  . IR REMOVAL TUN CV CATH W/O FL  11/22/2017  . IR US GUIDE VASC ACCESS RIGHT  09/24/2017  . IR US GUIDE VASC ACCESS RIGHT  08/01/2018  . IRRIGATION AND DEBRIDEMENT ABSCESS Left 05/06/2017   Procedure: IRRIGATION AND DEBRIDEMENT ABSCESS LEFT FOOT;  Surgeon: Edrick Kins, DPM;  Location: [facility];  Service: Podiatry;  Laterality:  Left;  . PERIPHERALLY INSERTED CENTRAL CATHETER INSERTION    . SKIN GRAFT Right 06/15/2014  . SKIN GRAFT Left 06/2015   foot   . SKIN SPLIT GRAFT Right 06/15/2014   Procedure: SPLIT THICKNESS SKIN GRAFT RIGHT FOOT;  Surgeon: Newt Minion, MD;  Location: [facility];  Service: Orthopedics;  Laterality: Right;  . TRANSMETATARSAL AMPUTATION Right   . TRANSMETATARSAL AMPUTATION Right 09/11/2014    Family History  Problem Relation Age of Onset  . Hyperlipidemia Mother   . Dementia Mother     Social History:  reports that she quit smoking about 10 years ago. She quit after 15.00 years of use. She has never used smokeless tobacco. She reports previous alcohol use. She reports that she does not use drugs.  Review of Systems:   Hypertension/CKD:     Hypertension  Treated with amlodipine 5 mg, followed by nephrologist  BP Readings from Last 3 Encounters:  02/24/19 (!) 148/82  02/04/19 100/60  12/03/18 110/70     She has history of vitamin D deficiency, managed by nephrologist, no recent labs available for calcium   Lab Results  Component Value Date   CALCIUM 6.7 (L) 08/08/2018   PHOS 4.6 08/08/2018     Lipids: Has been  treated with pravastatin 40 mg  Triglycerides are high on the last measurement LDL as follows   Lab Results  Component Value Date   CHOL 152 06/12/2018   HDL 24.40 (L) 06/12/2018   LDLCALC 78 05/02/2016   LDLDIRECT 82.0 06/12/2018   TRIG 222.0 (H) 06/12/2018   CHOLHDL 6 06/12/2018      Chronic anemia, followed by nephrologist and PCP:  Lab Results  Component Value Date   WBC 6.1 11/10/2018   HGB 9.3 (L) 11/10/2018   HCT 31.2 (L) 11/10/2018   MCV 73.9 (L) 11/10/2018   PLT 370 11/10/2018    She has a Charcot foot, has had amputation   Currently getting hyperbaric treatment for osteomyelitis of the right foot and has been recommended below-knee amputation which he is refusing     Examination:   BP (!) 148/82 (BP Location: Right Arm, Patient Position: Sitting, Cuff Size: Normal)   Pulse 90   Temp 98.4 F (36.9 C)   Ht 5\' 7"  (1.702 m)   Wt 124 lb 6.4 oz (56.4 kg)   SpO2 98%   BMI 19.48 kg/m   Body mass index is 19.48 kg/m.      ASSESSMENT/ PLAN:   Diabetes type 1 with poor control and  multiple complications      See history of present illness for detailed discussion of current diabetes management, blood sugar patterns and problems identified  A1c is now relatively better at 9.3, previously 11.4  Her blood sugars are however still averaging 350 over the last 4 weeks  Most of her high blood sugars are related to late boluses after meals and generally poor diet with large amounts of carbohydrate and snacking  She is not entering her blood sugars at the time of her bolus for meals also Blood sugars show quite a bit of variability as before also HIGHEST blood sugars are generally in the evenings when she is eating the most and lowest midday especially after dialysis Hypoglycemia has been minimal recently Still not able to program her pump settings and these were done in the office by the nurse educator    PLAN:     She is apparently getting her T-Slim pump and will start her  on this plan it is available  However reminded her to start bolusing consistently before she starts eating and cover all carbohydrate intake with boluses  She will need to avoid simple sugars and sweets  Correction factor I: 70 even overnight when she is needing larger amounts of correction and when her sugars appear to be the highest  Basal rate will be increased To 0.6 at midnight, 0.85 at 7 PM but reduced at 11 AM to 0.4  There are no Patient Instructions on file for this visit.   Counseling time on subjects discussed in assessment and plan sections is over 50% of today's 25 minute visit   Elayne Snare 02/24/2019, 11:42 AM      Elayne Snare 02/24/19

## 2019-03-04 ENCOUNTER — Encounter (HOSPITAL_BASED_OUTPATIENT_CLINIC_OR_DEPARTMENT_OTHER): Payer: Medicare HMO | Admitting: Physician Assistant

## 2019-03-09 ENCOUNTER — Other Ambulatory Visit: Payer: Self-pay

## 2019-03-09 ENCOUNTER — Encounter (HOSPITAL_BASED_OUTPATIENT_CLINIC_OR_DEPARTMENT_OTHER): Payer: Medicare HMO | Attending: Internal Medicine | Admitting: Internal Medicine

## 2019-03-09 DIAGNOSIS — L02415 Cutaneous abscess of right lower limb: Secondary | ICD-10-CM | POA: Insufficient documentation

## 2019-03-09 DIAGNOSIS — M86671 Other chronic osteomyelitis, right ankle and foot: Secondary | ICD-10-CM | POA: Insufficient documentation

## 2019-03-09 DIAGNOSIS — Z9641 Presence of insulin pump (external) (internal): Secondary | ICD-10-CM | POA: Diagnosis not present

## 2019-03-09 DIAGNOSIS — Z89422 Acquired absence of other left toe(s): Secondary | ICD-10-CM | POA: Diagnosis not present

## 2019-03-09 DIAGNOSIS — N183 Chronic kidney disease, stage 3 unspecified: Secondary | ICD-10-CM | POA: Diagnosis not present

## 2019-03-09 DIAGNOSIS — E10621 Type 1 diabetes mellitus with foot ulcer: Secondary | ICD-10-CM | POA: Insufficient documentation

## 2019-03-09 DIAGNOSIS — E1051 Type 1 diabetes mellitus with diabetic peripheral angiopathy without gangrene: Secondary | ICD-10-CM | POA: Diagnosis not present

## 2019-03-09 DIAGNOSIS — E1022 Type 1 diabetes mellitus with diabetic chronic kidney disease: Secondary | ICD-10-CM | POA: Insufficient documentation

## 2019-03-09 DIAGNOSIS — L97514 Non-pressure chronic ulcer of other part of right foot with necrosis of bone: Secondary | ICD-10-CM | POA: Insufficient documentation

## 2019-03-09 DIAGNOSIS — E1040 Type 1 diabetes mellitus with diabetic neuropathy, unspecified: Secondary | ICD-10-CM | POA: Insufficient documentation

## 2019-03-09 DIAGNOSIS — L97812 Non-pressure chronic ulcer of other part of right lower leg with fat layer exposed: Secondary | ICD-10-CM | POA: Insufficient documentation

## 2019-03-10 NOTE — Progress Notes (Signed)
MADDUX, FIRST ([id number]) Visit Report for 11/11/2018 Arrival Information Details Patient Name: Date of Service: Marta, Bouie 11/11/2018 1:15 PM Medical Record Number:[id number] Patient Account Number: [id number] Date of Birth/Sex: Treating RN: 1971/03/13 (48 y.o. [name] Primary Care Montine Hight: [name] Other Clinician: Referring Eleftherios Dudenhoeffer: Treating Hanna Aultman/Extender:Madduri, Verlin Grills, [name] in Treatment: 9 Visit Information History Since Last Visit All ordered tests and consults were completed: No Patient Arrived: Wheel Chair Added or deleted any medications: No Arrival Time: 16:00 Any new allergies or adverse reactions: No Accompanied By: self Had a fall or experienced change in No activities of daily living that may affect Transfer Assistance: None risk of falls: Patient Identification Verified: Yes Signs or symptoms of abuse/neglect since last visito No Secondary Verification Process Completed: Yes Hospitalized since last visit: No Patient Requires Transmission-Based No Implantable device outside of the clinic excluding No Precautions: cellular tissue based products placed in the center Patient Has Alerts: No since last visit: Pain Present Now: No Electronic Signature(s) Signed: 11/13/2018 3:18:11 PM By: [name] EMT/HBOT Entered By: [name] on 11/11/2018 16:11:38 -------------------------------------------------------------------------------- Encounter Discharge Information Details Patient Name: Date of Service: [name] 11/11/2018 1:15 PM Medical Record Number:[id number] Patient Account Number: [id number] Date of Birth/Sex: Treating RN: Dec 05, 1970 (48 y.o. Nancy Fetter Primary Care Hena Ewalt: [name] Other Clinician: Referring Sheva Mcdougle: Treating Nyssa Sayegh/Extender:Madduri, Verlin Grills, [name] in Treatment: 9 Encounter Discharge Information Items Post Procedure Vitals Discharge  Condition: Stable Temperature (F): 98.3 Ambulatory Status: Wheelchair Pulse (bpm): 96 Discharge Destination: Home Respiratory Rate (breaths/min): 18 Transportation: Private Auto Blood Pressure (mmHg): 131/89 Accompanied By: alone Schedule Follow-up Appointment: Yes Clinical Summary of Care: Patient Declined Electronic Signature(s) Signed: 11/17/2018 6:10:13 PM By: [name] RN, BSN Entered By: [name] on 11/11/2018 18:17:17 -------------------------------------------------------------------------------- Multi Wound Chart Details Patient Name: Date of Service: [name] 11/11/2018 1:15 PM Medical Record Number:[id number] Patient Account Number: [id number] Date of Birth/Sex: Treating RN: December 17, 1970 (48 y.o. 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 Extremity Comorbid History: Cataracts, Chronic sinus Cataracts, Chronic sinus N/A problems/congestion, problems/congestion, Anemia, Sleep Apnea, Deep Anemia, Sleep Apnea, Deep Vein Thrombosis, Vein Thrombosis, Hypertension, Peripheral Hypertension, Peripheral Arterial Disease, Type I Arterial Disease, Type I Diabetes, Osteoarthritis, Diabetes, Osteoarthritis, Osteomyelitis, Neuropathy, Osteomyelitis, Neuropathy, Seizure Disorder Seizure Disorder Date Acquired: 08/04/2018 08/28/2018 N/A Weeks of Treatment: 9 9 N/A Wound Status: Open Open N/A Measurements L x W x D 7.3x4.7x2.4  1.8x2.9x0.1 N/A (cm) Area (cm) : 26.947 4.1 N/A Volume (cm) : 64.673 0.41 N/A % Reduction in Area: 62.90% -63.20% N/A % Reduction in Volume: -197.10% -63.30% N/A Classification: Grade 4 Grade 2 N/A Exudate Amount: Large Small N/A Exudate Type: Serosanguineous Serosanguineous N/A Exudate Color: red, brown red, brown N/A Wound Margin: Distinct, outline attached Flat and Intact N/A Granulation Amount: Medium (34-66%) None Present (0%) N/A Granulation Quality: Red N/A N/A Necrotic Amount: Medium (34-66%) Large (67-100%) N/A Necrotic Tissue: Eschar, Adherent Slough Eschar N/A Exposed Structures: Fat Layer (Subcutaneous Fat Layer (Subcutaneous N/A Tissue) Exposed: Yes Tissue) Exposed: Yes Tendon: Yes Fascia: No Muscle: Yes Tendon: No Bone: Yes Muscle: No Fascia: No Joint: No  Joint: No Bone: No Epithelialization: Small (1-33%) Small (1-33%) N/A Debridement: N/A Debridement - Excisional N/A Pre-procedure N/A 16:35 N/A Verification/Time Out Taken: Pain Control: N/A Lidocaine 5% topical N/A ointment Tissue Debrided: N/A Subcutaneous, Slough N/A Level: N/A Skin/Subcutaneous Tissue N/A Debridement Area (sq cm):N/A 5.22 N/A Instrument: N/A Curette N/A Bleeding: N/A Minimum N/A Hemostasis Achieved: N/A Pressure N/A Procedural Pain: N/A 0 N/A Post Procedural Pain: N/A 0 N/A Debridement Treatment N/A Procedure was tolerated N/A Response: well Post Debridement N/A 1.8x2.9x0.1 N/A Measurements L x W x D (cm) Post Debridement N/A 0.41 N/A Volume: (cm) Procedures Performed: N/A Debridement N/A Treatment Notes Wound #43 (Right, Medial Foot) 1. Cleanse With Wound Cleanser 3. Primary Dressing Applied Other primary dressing (specifiy in notes) 4. Secondary Dressing ABD Pad Roll Gauze 5. Secured With Tape Notes wet-to-dry Wound #44 (Right Calcaneus) 1. Cleanse With Wound Cleanser 3. Primary Dressing Applied Hydrofera Blue 4. Secondary Dressing ABD Pad Roll Gauze 5.  Secured With Recruitment consultant) Signed: 03/10/2019 3:07:45 PM By: Carlene Coria RN Entered By: Carlene Coria on 11/18/2018 16:32:17 -------------------------------------------------------------------------------- [place] Details Patient Name: Date of Service: Lurdes, [place] 11/11/2018 1:15 PM Medical Record Number:[id number] Patient Account Number: [id number] Date of Birth/Sex: Treating RN: [birth year] (48 y.o. [name] Primary Care Luva Metzger: [name] Other Clinician: Referring Trevel Dillenbeck: Treating Ivannia Willhelm/Extender:Madduri, Verlin Grills, [name] in Treatment: 9 Active Inactive Wound/Skin Impairment Nursing Diagnoses: Impaired tissue integrity Knowledge deficit related to ulceration/compromised skin integrity Goals: Patient/caregiver will verbalize understanding of skin care regimen Date Initiated: 09/03/2018 Target Resolution Date: 12/05/2018 Goal Status: Active Ulcer/skin breakdown will have a volume reduction of 30% by week 4 Date Initiated: 09/03/2018 Date Inactivated: 10/07/2018 Target Resolution Date: 10/01/2018 Unmet Goal Status: Unmet Reason: COMORBITIES Ulcer/skin breakdown will have a volume reduction of 50% by week 8 Date Initiated: 10/07/2018 Date Inactivated: 11/03/2018 Target Resolution Date: 10/31/2018 Unmet Goal Status: Unmet Reason: Osteomyelitis Interventions: Assess patient/caregiver ability to obtain necessary supplies Assess patient/caregiver ability to perform ulcer/skin care regimen upon admission and as needed Assess ulceration(s) every visit Provide education on ulcer and skin care Treatment Activities: Skin care regimen initiated : 09/03/2018 Topical wound management initiated : 09/03/2018 Notes: Electronic Signature(s) Signed: 03/10/2019 3:07:45 PM By: Carlene Coria RN Entered By: Carlene Coria on 11/11/2018 16:28:21 -------------------------------------------------------------------------------- Pain Assessment  Details Patient Name: Date of Service: Bettyjean, Stefanski 11/11/2018 1:15 PM Medical Record Number:[id number] Patient Account Number: [id number] Date of Birth/Sex: Treating RN: 1970/08/06 (48 y.o. [name] Primary Care Quantina Dershem: [name] Other Clinician: Referring Roniyah Llorens: Treating Nick Armel/Extender:Madduri, Verlin Grills, [name] in Treatment: 9 Active Problems Location of Pain Severity and Description of Pain Patient Has Paino No Site Locations With Dressing Change: No Pain Management and Medication Current Pain Management: Electronic Signature(s) Signed: 11/13/2018 3:18:11 PM By: [name] EMT/HBOT Signed: 03/10/2019 3:07:45 PM By: Carlene Coria RN Entered By: [name] on 11/11/2018 16:12:25 -------------------------------------------------------------------------------- Patient/Caregiver Education Details Patient Name: Date of Service: Raudenbush, Zanylah L. 8/11/2020andnbsp1:15 PM Medical Record Number:[id number] Patient Account Number: [id number] Date of Birth/Gender: Treating RN: 19-May-1970 (48 y.o. [name] Primary Care Physician: [name] Other Clinician: Referring Physician: Treating Physician/Extender:Madduri, Verlin Grills, [name] in Treatment: 9 Education Assessment Education Provided To: Patient Education Topics Provided Wound/Skin Impairment: Methods: Explain/Verbal Responses: State content correctly Electronic Signature(s) Signed: 03/10/2019 3:07:45 PM By: Carlene Coria RN Entered By: Carlene Coria on 11/11/2018 16:28:38 -------------------------------------------------------------------------------- Wound Assessment Details Patient Name: Date of Service: [name] 11/11/2018 1:15 PM Medical Record Number:[id number] Patient Account Number:  [id number] Date of Birth/Sex: Treating RN: [birth year] (48 y.o. [name] Primary Care Taleeya Blondin: [name] Other Clinician: [name] Referring  Abraham Entwistle: Treating Bernie Ransford/Extender:Madduri, Verlin Grills, [name] in Treatment: 9 Wound Status Wound Number: [id number] Primary Diabetic Wound/Ulcer of the Lower Extremity Etiology: Wound Location: Right Foot - Medial Wound Open Wounding Event: Gradually Appeared Status: Date Acquired: 08/04/2018 Comorbid Cataracts, Chronic sinus problems/congestion, Weeks Of Treatment: 9 History: Anemia, Sleep Apnea, Deep Vein Thrombosis, Clustered Wound: No Hypertension, Peripheral Arterial Disease, Type I Diabetes, Osteoarthritis, Osteomyelitis, Neuropathy, Seizure Disorder Photos Wound Measurements Length: (cm) 7.3 % Reducti Width: (cm) 4.7 % Reducti Depth: (cm) 2.4 Epithelia Area: (cm) 26.947 Volume: (cm) 64.673 Wound Description Classification: Grade 4 Wound Margin: Distinct, outline attached Exudate Amount: Large Exudate Type: Serosanguineous Exudate Color: red, brown Wound Bed Granulation Amount: Medium (34-66%) Granulation Quality: Red Necrotic Amount: Medium (34-66%) Necrotic Quality: Eschar, Adherent Slough Foul Odor After Cleansing: No Slough/Fibrino Yes Exposed Structure Fascia Exposed: No Fat Layer (Subcutaneous Tissue) Exposed: Yes Tendon Exposed: Yes Muscle Exposed: Yes Necrosis of Muscle: Yes Joint Exposed: No Bone Exposed: Yes on in Area: 62.9% on in Volume: -197.1% lization: Small (1-33%) Electronic Signature(s) Signed: 11/13/2018 3:18:11 PM By: [name] EMT/HBOT Signed: 03/10/2019 3:07:45 PM By: Carlene Coria RN Entered By: [name] on 11/12/2018 09:17:00 -------------------------------------------------------------------------------- Wound Assessment Details Patient Name: Date of Service: [name] 11/11/2018 1:15 PM Medical Record Number:[id number] Patient Account Number: [id number] Date of Birth/Sex: Treating RN: April 27, 1970 (48 y.o. [name] Primary Care Kimaya Whitlatch: [name] Other Clinician: [name] Referring  Tsuyako Jolley: Treating Atziry Baranski/Extender:Madduri, Verlin Grills, [name] in Treatment: 9 Wound Status Wound Number: 44 Primary Diabetic Wound/Ulcer of the Lower Extremity Etiology: Wound Location: Right Calcaneus Wound Open Wounding Event: Gradually Appeared Status: Date Acquired: 08/28/2018 Comorbid Cataracts, Chronic sinus problems/congestion, Weeks Of Treatment: 9 History: Anemia, Sleep Apnea, Deep Vein Thrombosis, Clustered Wound: No Hypertension, Peripheral Arterial Disease, Type I Diabetes, Osteoarthritis, Osteomyelitis, Neuropathy, Seizure Disorder Photos Wound Measurements Length: (cm) 1.8 Width: (cm) 2.9 Depth: (cm) 0.1 Area: (cm) 4.1 Volume: (cm) 0.41 Wound Description Classification: Grade 2 Wound Margin: Flat and Intact Exudate Amount: Small Exudate Type: Serosanguineous Exudate Color: red, brown Wound Bed Granulation Amount: None Present (0%) Necrotic Amount: Large (67-100%) Necrotic Quality: Eschar fter Cleansing: No ino Yes Exposed Structure ed: No ubcutaneous Tissue) Exposed: Yes ed: No ed: No d: No : No % Reduction in Area: -63.2% % Reduction in Volume: -63.3% Epithelialization: Small (1-33%) Foul Odor A Slough/Fibr Fascia Expos Fat Layer (S Tendon Expos Muscle Expos Joint Expose Bone Exposed Electronic Signature(s) Signed: 11/13/2018 3:18:11 PM By: [name] EMT/HBOT Signed: 03/10/2019 3:07:45 PM By: Carlene Coria RN Entered By: [name] on 11/12/2018 09:17:48 -------------------------------------------------------------------------------- Vitals Details Patient Name: Date of Service: [name] 11/11/2018 1:15 PM Medical Record Number:[id number] Patient Account Number: [id number] Date of Birth/Sex: Treating RN: 1971/04/02 (48 y.o. [name] Primary Care Taneesha Edgin: [name] Other Clinician: Referring Yzabelle Calles: Treating Conal Shetley/Extender:Madduri, Verlin Grills, [name] in Treatment: 9 Vital  Signs Time Taken: 16:15 Temperature (F): 98.3 Height (in): 67 Pulse (bpm): 96 Weight (lbs): 125 Respiratory Rate (breaths/min): 18 Body Mass Index (BMI): 19.6 Blood Pressure (mmHg): 131/89 Reference Range: 80 - 120 mg / dl Electronic Signature(s) Signed: 03/10/2019 3:07:45 PM By: Carlene Coria RN Entered By: Carlene Coria on 11/11/2018 16:30:32

## 2019-03-10 NOTE — Progress Notes (Signed)
Nancy, [name] ([id number]) Visit Report for 03/09/2019 Debridement Details Patient Name: Date of Service: [name], [name] 03/09/2019 8:45 AM Medical Record Number:[id number] Patient Account Number: [id number] Date of Birth/Sex: Treating RN: May 05, 1970 (48 y.o. F) Primary Care Provider: [name] Other Clinician: Referring Provider: Treating Provider/Extender:, [name], [name] in Treatment: 26 Debridement Performed for Wound #43 Right,Medial Foot Assessment: Performed By: Physician Ricard Dillon., MD Debridement Type: Debridement Severity of Tissue Pre Fat layer exposed Debridement: Level of Consciousness (Pre- Awake and Alert procedure): Pre-procedure Verification/Time Out Taken: Yes - 09:36 Start Time: 09:36 Pain Control: Other : Benzocaine 20% Total Area Debrided (L x W): 1 (cm) x 1 (cm) = 1 (cm) Tissue and other material Viable, Non-Viable, Slough, Subcutaneous, Slough debrided: Level: Skin/Subcutaneous Tissue Debridement Description: Excisional Instrument: Curette Bleeding: Minimum Hemostasis Achieved: Pressure End Time: 09:38 Procedural Pain: 0 Post Procedural Pain: 0 Response to Treatment: Procedure was tolerated well Level of Consciousness Awake and Alert (Post-procedure): Post Debridement Measurements of Total Wound Length: (cm) 4.7 Width: (cm) 3.8 Depth: (cm) 0.5 Volume: (cm) 7.014 Character of Wound/Ulcer Post Improved Debridement: Severity of Tissue Post Debridement: Fat layer exposed Post Procedure Diagnosis Same as Pre-procedure Electronic Signature(s) Signed: 03/10/2019 10:01:17 AM By: Linton Ham MD Entered By: Linton Ham on 03/09/2019 10:17:43 -------------------------------------------------------------------------------- HPI Details Patient Name: Date of Service: [name] 03/09/2019 8:45 AM Medical Record Number:[id number] Patient Account Number: [id number] Date of  Birth/Sex: Treating RN: 10/30/1970 (48 y.o. F) Primary Care Provider: [name] Other Clinician: Referring Provider: Treating Provider/Extender:, [name], [name] in Treatment: 26 History of Present Illness HPI Description: 48 year old diabetic who is known to have type 1 diabetes which is poorly controlled last hemoglobin A1c was 11%. She comes in with a ulcerated area on the left lateral foot which has been there for over 6 months. Was recently she has been treated by [name] of podiatry who saw her last on 05/28/2016. Review of his notes revealed that the patient had incision and drainage with placement of antibiotic beads to the left foot on 04/11/2016 for possible osteomyelitis of the cuboid bone. Over the last year she's had a history of amputation of the left fifth toe and a femoropopliteal popliteal bypass graft somewhere in April 2017. 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 His note [name] mentions that the patient has been referred to me for further wound care and possibly great candidate for hyperbaric oxygen therapy due to recurrent osteomyelitis. However we do not have any x-rays of biopsy reports confirming this. He has been on several antibiotics including Bactrim and most recently is on doxycycline for an MRSA. I understand, the patient was not a candidate for IV antibiotics as she has had previous PICC lines which resulted in blood clots in both arms. There was a x-ray report dated 04/04/2016 on [name] notes which showed evidence of fifth ray resection left foot with osteolytic changes noted to the fourth metatarsal and cuboid bone on the left. 06/13/2016 -- had a left foot x-ray which showed no acute fracture or dislocation and no definite radiographic evidence of osteomyelitis. Advanced osteopenia was seen. 06/20/2016 -- she has noticed a new wound on the right plantar foot in the region where she had a callus  before. 06/27/16- the patient did have her x-ray of the right foot which showed no findings to suggest osteomyelitis. She saw her endocrinologist, [name], yesterday. Her A1c in January was 11. He also indicates mismanagement and noncompliance regarding her  diabetes. She is currently on Bactrim for a lip infection. She is complaining of nausea, vomiting and diarrhea. She is unable to articulate the exact orders or dosing of the Bactrim; it is unclear when she will complete this. 07/04/2016 -- results from [facility] of ABIs with ankle waveforms were noted from 02/14/2016. The examination done on 06/27/2015 showed noncompressible ABIs with the right being 1.45 and the left being 1.33. The present examination showed a right ABI of 1.19 on the left of 1.33. The conclusion was that right normal ABI in the lower extremity at rest however compared to previous study which was noncompressible ABI may be falsely elevated side suggesting medial calcification. The left ABI suggested medial calcification. 08/01/2016 -- the patient had more redness and pain on her right foot and did not get to come to see as noted she see her PCP or go to the ER and decided to take some leftover metronidazole which she had at home. As usual, the patient does report she feels and is rather noncompliant. 08/08/2016 -- -- x-ray of the right foot -- FINDINGS:Transmetatarsal amputation is noted. No bony destruction is noted to suggest osteomyelitis. IMPRESSION: No evidence of osteomyelitis. Postsurgical changes are seen. MRI would be more sensitive for possible bony changes. Culture has grown Serratia Marcescens -- sensitive to Bactrim, ciprofloxacin, ceftazidime she was seen by [name] on 08/06/2016. He did not find any exposed bone, muscle, tendon, ligament or joint. There was no malodor and he did a excisional debridement in the office. ============ Old notes: 48 year old patient who is known to the wound clinic  for a while had been away from the wound clinic since 09/01/2014. Over the last several months she has been admitted to various hospitals including [place] at [facility]. She was treated for a right metatarsal osteomyelitis with a transmetatarsal amputation and this was done about 2 months ago. He has a small ulcerated area on the right heel and she continues to have an ulcerated area on the left plantar aspect of the foot. The patient was recently admitted to the [facility] group between 7/12 and 10/18/2014. she was given 3 weeks of IV vancomycin and was to follow-up with her surgeons at [facility] and also took oral vancomycin for C. difficile colitis. 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 MRI of the left foot done on 10/14/2014 showed no abscess or osteomyelitis. 04/27/15; this is a patient we know from previous stays in the wound care center. She is a type I diabetic I am not sure of her control currently. Since the last time I saw her she is had a right transmetatarsal amputation and has no wounds on her right foot and has no open wounds. She is been followed at the wound care center at [facility] in [place]. She comes today with the desire to undergo hyperbaric treatment locally. Apparently one of her wound care providers in [place] has suggested hyperbarics. This is in response to an MRI from 04/18/15 that showed increased marrow signal and loss of the proximal fifth metatarsal cortex evidence of osteomyelitis with likely early osteomyelitis in the cuboid bone as well. She has a large wound over the base of the fifth metatarsal. She also has a eschar over her the tips of her toes  on 1,3  and 5. She does not have peripheral pulses and apparently is going for an angiogram tomorrow which seems reasonable. After this she is going to infectious disease at [facility]. They have been using Medihoney to the large wound on the lateral aspect of the left foot to. The patient has known Charcot deformity from diabetic neuropathy. She also has known diabetic PAD. Surprisingly I can't see that she has had any recent antibiotics, the patient states the last antibiotic she had was at the end of November for 10 days. I think this was in response to culture that showed group G strep although I'm not exactly sure where the culture was from. She is also had arterial studies on 03/29/15. This showed a right ABI of 1.4 that was noncompressible. Her left ABI was 0.73. 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 It was not felt that arterial inflow was adequate for healing of a foot ulcer. Her Doppler waveforms looked monophasic ===== READMISSION 02/28/17; this is in an now 48 year old woman we've had at several different occasions in this clinic. 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 She has a remote ex-smoker. She was last seen in this clinic by Dr. Con Memos I think in May. More recently she is been followed by her podiatrist [name] an infectious disease Dr. Megan Salon. She has 2 open wounds the major one is over the right first metatarsal head she also has a wound on the left plantar foot. an MRI of the right foot on 01/01/17 showed a soft tissue ulcer along the plantar aspect of the first metatarsal base consistent with osteomyelitis of the first metatarsal stump. Dr. 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 According to the patient this is been open for slightly over a month. She has been on a combination of Cipro 500 twice a day, Zyvox 600 twice a day and  Flagyl 500 3 times a day for over a month now as directed by Dr. Megan Salon. cultures of the right foot earlier this year showed MRSA in January and Serratia in May. January also had a few viridans strep. Recent x-rays of both feet were done and [name] office and I don't have these reports. The patient has known PAD and has a history of aleft femoropopliteal bypass in April 2017. 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 Dwyane Dee. In spite of this the last hemoglobin A1c I can see is 10.1 on 01/01/2017. She is being referred by 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 She is been using Medihoney to this area and also an area on the left midfoot. She is using healing sandals bilaterally. ABIs in this clinic at the left posterior tibial was 1.1 noncompressible on the right READMISSION Non invasive vascular NOVANT 5/18 Aftercare following surgery of the circulatory system Procedure Note - Interface, External Ris In - 08/13/2016 11:05 AM EDT Procedure: Examination consists of physiologic resting arterial pressures of the brachial and ankle arteries bilaterally with continuous wave Doppler waveform analysis. Previous: Previous exam performed on 02/14/16 demonstrated ABIs of Rt = 1.19 and Lt = 1.33. Right: ABI = non-compressible PT, 1.47 DP. S/P transmet amputation. Left:  ABI = 1.52, 2nd digit pressure = 87 mmHg Conclusions: Right: ABI (>1.3) may be falsely elevated, suggesting medial calcification. Left: ABI (>1.3) may be falsely elevated, suggesting medial calcification The patient is a now 48 year old type I diabetic is had multiple issues her graded to chronic diabetic foot ulcers. She has had a previous right transmetatarsal amputation fifth ray  amputation. She had Charcot feet diabetic polyneuropathy. We had her in the clinic lastin November. At that point she had wounds on her bilateral feet.she had wanted to try hyperbarics however the healogics review process denied her because she hadn't followed up with her vascular surgeon for her left femoropopliteal bypass. The bypass was done by Dr. Raul Del at [facility]. We made her a follow-up with Dr. Raul Del however she did not keep the appointment and therefore she was not approved The patient shows me a small wound on her left fourth metatarsal head on her phone. She developed rapid discoloration in the plantar aspect of the left foot and she was admitted to hospital from 2/2 through 05/10/17 with wet gangrene of the left foot osteomyelitis of the fourth metatarsal heads. She was admitted acutely ill with a temperature of 103. She was started on broad-spectrum vancomycin and cefepime. On 05/06/17 she was taken to the OR by [name] her podiatric surgeon for an incision and drainage irrigation of the left foot wound. Cultures from this surgery revealed group be strep and anaerobes. she was seen by Dr.Xu of orthopedic surgery and scheduled for a below-knee amputation which she u refused. Ultimately she was discharged on Levaquin and Flagyl for one month. MRI 05/05/17 done while she was in the hospital showed abscess adjacent to the fourth metatarsal head and neck small abscess around the fourth flexor tendon. Inflammatory phlegmon and gas in the soft tissues along the lateral aspect of the fourth phalanx. Findings worrisome for osteomyelitis involving the fourth proximal and middle phalanx and also the third and fourth metatarsals. Finally the patient had actually shortly before this followed up with Dr. Raul Del at no time on 04/29/17. He felt that her left femoropopliteal bypass was patent he felt that her left-sided toe pressures more than adequate for healing a wound on the left foot. This  was before her acute presentation. 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 05/28/17; she is started hyperbarics. The patient tells me that for some reason she was not actually on Levaquin but I think on ciprofloxacin. She was on Flagyl. She only started her Levaquin yesterday due to some difficulty with the pharmacy and perhaps her sister picking it up. She has an appointment with [name] tomorrow and with infectious disease early next week. She has no new complaints 06/06/17; the patient continues in hyperbarics. She saw [name] on 05/29/17 who is her podiatric surgeon. He is elected for a transmetatarsal amputation on 06/27/17. I'm not sure at what level he plans to do this amputation. The patient is unaware She also saw Dr. Megan Salon of infectious disease who elected to continue her on current antibiotics I think this is ciprofloxacin and Flagyl. I'll need to clarify with her tomorrow if she actually has this. We're using silver alginate to the actual wound. Necrotic surface today with material under the flap of her foot. Original MRI showed abscesses as well as osteomyelitis of the proximal and middle fourth phalanx and the third and fourth metatarsal heads 06/11/17; patient continues in hyperbarics and continues on oral antibiotics. She is doing well. The wound looks better. The necrotic part  of this under the flap in her superior foot also looks better. she is been to see [name]. I haven't had a chance to look at his note. Apparently he has put the transmetatarsal amputation on hold her request it is still planning to take her to the OR for debridement and product application ACEL. I'll see if I can find his note. I'll therefore leave product ordering/requests to [name] for now. I was going to look at Dermagraft 06/18/17-she is here in follow-up evaluation for bilateral foot wounds. She continues with hyperbaric therapy. She states she has been applying manuka honey to the right plantar  foot and alternate manuka honey and silver alginate to the left foot, despite our orders. We will continue with same treatment plan and she will follo up next week. 06/25/17; I have reviewed [name] last note from 3/11. She has operative debridement in 2 days' time. By review his note apparently they're going to place there is skin over the majority of this wound which is a good choice. She has a small satellite area at the most proximal part of this wound on the left plantar foot. The area on the right plantar foot we've been using silver alginate and it is close to healing. 07/02/17; unfortunately the patient was not easily approved for [name] proposed surgery. I'm not completely certain what the issue is. She has been using silver alginate to the wound she has completed a first course of hyperbarics. She is still on Levaquin and Flagyl. I have really lost track of the time course here.I suspect she should have another week to 2 of antibiotics. I'll need to see if she is followed up with infectious disease Dr. Megan Salon 07/09/17; the patient is followed up with Dr. Megan Salon. 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 She continues on Levaquin and metronidazole continuing both of these for now I think she is been on fr about 6 weeks. She still has some drainage but no pain. No fever. Her had been plans for her to go to the OR for operative debridement with her podiatrist [name], I am not exactly sure where that is. I'll probably slip a note to [name] today. I note that she follows with Dr. Dwyane Dee of endocrinology. We have her recertified for hyperbaric oxygen. I have not heard about Dermagraft however I'll see if [name] is planning a skin substitute as well 07/16/17; the patient tells me she is just about out of [place]. I'll need to check Dr. Hale Bogus last notes on this. She states she has plenty of Flagyl however. She comes in today complaining  of pain in the right lateral foot which she said lasted for about a day. The wound on the right foot is actually much more medially. She also tells me that the [facility] cost a lot of pain in the left foot wound and she turned back to silver alginate. Finally Dermagraft has a $563 per application co-pay. She cannot afford this 07/23/17; patient arrives today with the wound not much smaller. There is not much new to add. She has not heard from [name] all try to put in a call to them today. She was asking about Dermagraft again and she has an over $149 per application co-pay she states that she would be willing to try to do a payment plan. I been tried to avoid this. We've been using silver alginate, I'll change to  Hydrofera Blue 07/30/17-She is here in follow-up evaluation for left foot ulcer. She continues hyperbaric medicine. The left foot ulcer is stable we will continue with same treatment plan 08/06/17; she is here for evaluation of her left foot ulcer. Currently being treated for hyperbarics or underlying osteomyelitis. She is completed antibiotics. The left foot ulcer is better smaller with healthier looking granulation. For various reasons I am not really clear on we never got her back to the OR with [name]. He did not respond to my secure text message. Nevertheless I think that surgery on this point is not necessary nor am I completely clear that a skin substitute is necessary The patient is complaining about pain on the outside of her right foot. 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 There is no erythema. She also states the foot is warm versus her other part of her upper leg and this is largely true. It is not totally clear to me what's causing this. She thinks it's different from her usual neuropathy pain 08/13/17; she arrives in clinic today with a small wound which is superficial on her right first metatarsal head. 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 She tells [place] she was up on her feet over the Mother's Day celebration. The large wound is on the left foot. Continues with hyperbarics for underlying osteomyelitis. We're using Hydrofera Blue. She asked me today about where we were with Dermagraft. I had actually excluded this because of the co- pay however she wants to assume this therefore I'll recheck the co-pay an order for next week. 08/20/17; the patient agreed to accept the co-pay of the first Apligraf which we applied today. She is disappointed she is finishing hyperbarics will run this through the insurance on the extent of the foot infection and the extent of the wound that she had however she is already had 60 dive's. Dermagraft No. 1 08/27/17; Dermagraft No. 2. She is not eligible for any more hyperbaric treatments this month. She reports a fair amount of drainage and she actually changed to the external dressings without disturbing the direct contact layer 09/03/17; the patient arrived in clinic today with the wound superficially looking quite healthy. Nice vibrant red tissue with some advancing epithelialization although not as much adherence of the flap as I might like. However she noted on her own fourth toe some bogginess and she brought that to our attention. Indeed this was boggy feeling like a possibility of subcutaneous fluid. 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 She is not been unwell. We've been using Dermagraft 09/10/17; the culture that I did not last week was MRSA. She saw Dr. Megan Salon this morning who is going to start her on vancomycin. I had sent him a secure a text message yesterday. I also spoke with her podiatric surgeon [name] about surgery on this foot the options for conserving a functional foot etc. Promised me he would see her and will make back consultation today. Paradoxically her actual wound on the plantar aspect of  her left foot looks really quite good. I had given her 5 days worth of Baxdella to cover her for MRSA. Her MRI came back showing osteomyelitis within the third metatarsal shaft and head and base of the third and fourth proximal phalanx. She had extensive inflammatory changes throughout the soft tissue of the lateral forefoot. With an ill-defined fluid around the  fourth metatarsal extending into the plantar and dorsal soft tissues 09/19/17; the patient is actually on oral Septra and Flagyl. She apparently refused IV vancomycin. She also saw [name] at my request who is planning her for a left BKA sometime in mid July. MRI showed osteomyelitis within the third metatarsal shaft and head and the basis of the third and fourth proximal phalanx. I believe there was felt to be possible septic arthritis involving the third MTP. 09/26/17; the patient went back to Dr. Megan Salon at my suggestion and is now receiving IV daptomycin. Her wound continues to look quite good making the decision to proceed with a transmetatarsal amputation although more difficult for the patient. I believe in my extensive discussions with her she has a good sense of the pros and cons of this. I don't NV the tuft decision she has to make. She has an appointment with [name] I believe in mid July and I previously spoken to him about this issue Has we had used 3 previous Dermagraft. Given the condition of the wound surface I went ahead and added the fourth one today area and I did this not fully realizing that she'll be traveling to [place] next week. I'm hopeful she can come back in 2 weeks 10/21/17; Her same Dermagraft on for about 3-1/2 weeks. In spite of this the wound arrives looking quite healthy. There is been a lot of healing dimensions are smaller. Looking at the square shaped wound she has now there is some undermining and some depth medially under the undermining although I cannot palpate any bone. No surrounding infection  is obvious. She has difficult questions about how to look at this going forward vis--vis amputations versus continued medical therapy. To be truthful the wound is looks so healthy and it is continued to contract. Hard to justify foot surgery at this point although I still told her that I think it might come to that if we are not able to eradicate the underlying MRSA. She is still highly at risk and she understands this 11/06/17 on evaluation today patient appears to be doing better in regard to her foot ulcer. She's been tolerating the dressing changes without complication. Currently she is here for her Dermagraft #6. Her wound continues to make excellent progress at this point. She does not appear to have any evidence of infection which is good news. 11/13/17 on evaluation today patient appears to be doing excellent at this time. She is here for repeat Dermagraft application. This is #7. Overall her wound seems to be making great progress. 12/05/17; the patient arrives with the wound in much better condition than when I last saw this almost 6 weeks ago. She still has a small probing area in the left metatarsal head region on the lateral aspect of her foot. We applied her last Dermagraft today. Since the last time she is here she has what appears to a been a blood blister on the plantar aspect of left foot although I don't see this is threatening. There is also a thick raised tissue on the right mid metatarsal head region. This was not there I don't think the last time she was here 3 weeks ago. 12/12/17; the patient continues to have a small programming area in the left metatarsal head region on the lateral aspect of her foot which was the initial large surgical wound. I applied her last Apligraf last week. I'm going to use Endoform starting today Unfortunately she has an excoriated area in the  left mid foot and the right mid foot. The left midfoot looks like a blistered area this was not opened last  week it certainly is open today. Using silver alginate on these areas. She promises me she is offloading this. 12/19/17; the small probing area in the left metatarsal head eyes think is shallower. In general her original wound looks better. We've been using Endoform. The area inferiorly that I think was trauma last week still requires debridement a lot of nonviable surface which I removed. She still has an open open area distally in her foot Similarly on the right foot there is tightly adherent surface debris which I removed. Still areas that don't look completely epithelialized. This is a small open area. We used silver alginate on these areas 12/26/2017; the patient did not have the supplies we ordered from last week including the Endoform. The original large wound on the left lateral foot looks healthy. She still has the undermining area that is largely unchanged from last week. She has the same heavily callused raised edged wounds on the right mid and left midfoot. Both of these requiring debridement. We have been using silver alginate on these areas 01/02/2018; there is still supply issues. We are going to try to use Prisma but I am not sure she actually got it from what she is saying. She has a new open area on the lateral aspect of the left fourth toe [previous fifth ray amputation]. Still the one tunneling area over the fourth metatarsal head. The area is in the midfoot bilaterally still have thick callus around them. She is concerned about a raised swelling on the lateral aspect of the foot. However she is completely insensate 01/10/2018; we are using Prisma to the wounds on her bilateral feet. Surprisingly the tunneling area over the left fourth metatarsal head that was part of her original surgery has closed down. She has a small open area remaining on the incision line. 2 open areas in the midfoot. 02/10/2018; the patient arrives back in clinic after a month hiatus. She was traveling to  visit family in [place]. Is fairly clear she was not offloading the areas on her feet. The original wound over the left lateral foot at the level of metatarsal heads is reopened and probes medially by about a centimeter or 2. She notes that a week ago she had purulent drainage come out of an area on the left midfoot. Paradoxically the worst area is actually on the right foot is extensive with purulent drainage. We will use silver alginate today 02/17/2018; the patient has 3 wounds one over the left lateral foot. She still has a small area over the metatarsal heads which is the remnant of her original surgical wound. This has medial probing depth of roughly 1.4 cm somewhat better than last week. The area on the right foot is larger. We have been using silver alginate to all areas. The area on the right foot and left foot that we cultured last week showed both Klebsiella and Proteus. Both of these are quinolone sensitive. The patient put her's self on Bactrim and Flagyl that she had left hanging around from prior antibiotic usages. She was apparently on this last week when she arrived. I did not realize this. Unfortunately the Bactrim will not cover either 1 of these organisms. We will send in Cipro 500 twice daily for a week 03/04/2018; the patient has 2 wounds on the left foot one is the original wound which was a surgical wound  for a deep DFU. At one point this had exposed bone. She still has an area over the fourth metatarsal head that probes about 1.4 cm although I think this is better than last week. I been using silver nitrate to try and promote tissue adherence and been using silver alginate here. She also has an area in the left midfoot. This has some depth but a small linear wound. Still requiring debridement. On the right midfoot is a circular wound. A lot of thick callus around this area. We have been using silver alginate to all wound areas She is completed the ciprofloxacin I gave her  2 weeks ago. 03/11/2018; the patient continues to have 2 open areas on the left foot 1 of which was the original surgical wound for a deep DFU. Only a small probing area remains although this is not much different from last week we have been using silver alginate. The other area is on the midfoot this is smaller linear but still with some depth. We have been using silver alginate here as well On the right foot she has a small circular wound in the mid aspect. This is not much smaller than last time. We have been using silver alginate here as well 03/18/2018; she has 3 wounds on the left foot the original surgical wound, a very superficial wound in the mid aspect and then finally the area in the mid plantar foot. She arrives in today with a very concerning area in the wound in the mid plantar foot which is her most proximal wound. There is undermining here of roughly 1-1/2 cm superiorly. Serosanguineous drainage. She tells me she had some pain on for over the weekend that shot up her foot into her thigh and she tells me that she had a nodule in the groin area. She has the single wound in the right foot. We are using endoform to both wound areas 03/24/2018; the patient arrives with the original surgical wound in the area on the left midfoot about the same as last week. There is a collection of fluid under the surface of the skin extending from the surgical wound towards the midfoot although it does not reach the midfoot wound. The area on the right foot is about the same. Cultures from last week of the left midfoot wound showed abundant Klebsiella abundant Enterococcus faecalis and moderate methicillin resistant staph I gave her Levaquin but this would have only covered the Klebsiella. She will need linezolid 04/01/2018; she is taking linezolid but for the first few days only took 1 a day. I have advised her to finish this at twice daily dosing. In any case all of her wounds are a lot better  especially on the left foot. The original surgical wound is closed. The area on the left midfoot considerably smaller. The area on the right foot also smaller. 04/08/2018; her original surgical wound/osteomyelitis on the left foot remains closed. She has area on the left foot that is in the midfoot area but she had some streaking towards this. This is not connected with her original wound at least not visually. Small wound on the right midfoot appears somewhat smaller. 04/15/18; both wounds looks better. Original wound is better left midfoot. Using silver alginate 1/21; patient states she uses saltwater soak in, stones or remove callus from around her wounds. She is also concerned about a blood blister she had on the left foot but it simply resolved on its own. We've been using silver alginate 1/28; the  patient arrives today with the same streaking area from her metatarsals laterally [the site of her original surgical wound] down to the middle of her foot. There is some drainage in the subcutaneous area here. This concerns me that there is actually continued ongoing infection in the metatarsals probably the fourth and third. This fixates an MRI of the foot without contrast [chronic renal failure] The wound in the mid part of the foot is small but I wonder whether this area actually connects with the more distal foot. The area on the right midfoot is probably about the same. Callus thick skin around the small wound which I removed with a curette we have been using silver alginate on both wound areas 2/4; culture I did of the draining site on the left foot last time grew methicillin sensitive staph aureus. MRI of the left foot showed interval resolution of the findings surrounding the third metatarsal joint on the prior study consistent with treated osteomyelitis. Chronic soft tissue ulceration in the plantar and lateral aspect of the forefoot without residual focal fluid collection. No evidence of  recurrent osteomyelitis. 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 She tells me she had nausea and vomiting over the weekend and missed 2 days. In general her foot looks much the same. She has a small open area just below the left fourth metatarsal head. A linear area in the left midfoot. Some discoloration extending from the inferior part of this into the left lateral foot although this appears to be superficial. She has a small area on the right midfoot which generally looks smaller after debridement 2/18; the patient is completing his cephalexin and has another 2 days. She continues to have open areas on the left and right foot. 2/25; she is now off antibiotics. The area on the left foot at the site of her original surgical wound has closed yet again. She still has open areas in the mid part of her foot however these appear smaller. The area on the right mid foot looks about the same. We have been using silver alginate She tells me she had a serious hypoglycemic spell at home. She had to have EMS called and get IV dextrose 3/3; disappointing on the left lateral foot large area of necrotic tissue surrounding the linear area. This appears to track up towards the same original surgical wound. Required extensive debridement. The area on the right plantar foot is not a lot better also using silver 3/12; the culture I did last time showed abundant enterococcus. I have prescribed Augmentin, should cover any unrecognized anaerobes as well. In addition there were a few MRSA and Serratia that would not be well covered although I did not want to give her multiple antibiotics. She comes in today with a new wound in the right midfoot this is not connected with the  original wound over her MTP a lot of thick callus tissue around both wounds but once again she said she is not walking on these areas 3/17-Patient comes in for follow-up on the bilateral plantar wounds, the right midfoot and the left plantar wound. Both these are heavily callused surrounding the wounds. We are continuing to use silver alginate, she is compliant with offloading and states she uses a wheelchair fairly  often at home 3/24; both wound areas have thick callus. However things actually look quite a bit better here for the majority of her left foot and the right foot. 3/31; patient continues to have thick callused somewhat irritated looking tissue around the wounds which individually are fairly superficial. There is no evidence of surrounding infection. We have been using silver alginate however I change that to [facility] today 4/17; patient returns to clinic after having a scare with Covid she tested negative in her primary doctor's office. She has been using Hydrofera Blue. She does not have an open area on the right foot. On the left foot she has a small open area with the mid area not completely viable. She showed me pictures of what looks like a hemorrhagic blister from several days ago but that seems to have healed over this was on the lateral left foot 4/21; patient comes in to clinic with both her wounds on her feet closed. However over the weekend she started having pain in her right foot and leg up into the thigh. She felt as though she was running a low-grade fever but did not take her temperature. She took a doxycycline that she had leftover and yesterday a single Septra and metronidazole. She thinks things feel somewhat better. 4/28; duplex ultrasound I ordered last week was negative for DVT or superficial thrombophlebitis. She is completed the doxycycline I gave her. States she is still having a lot of pain in the right calf and right ankle which is no better than last  week. She cannot sleep. She also states she has a temperature of up to 101, coughing and complaining of visual loss in her bilateral eyes. Apparently she was tested for Covid 2 weeks ago at [facility] and that was negative. Readmission: 09/03/18 patient presents back for reevaluation after having been evaluated at the end of April regarding erythema and swelling of her right lower extremity. Subsequently she ended up going to the hospital on 07/29/18 and was admitted not to be discharged until 08/08/18. Unfortunately it was noted during the time that she was in the hospital that she did have methicillin-resistant Staphylococcus aureus as the infection noted at the site. It was also determined that she did have osteomyelitis which appears to be fairly significant. She was treated with vancomycin and in fact is still on IV vancomycin at dialysis currently. This is actually slated to continue until 09/12/18 at least which will be the completion of the six weeks of therapy. Nonetheless based on what I'm seeing at this point I'm not sure she will be anywhere near ready to discontinue antibiotics at that time. Since she was released from the hospital she was seen by [name] who is her podiatrist on 08/27/18. His note specifically states that he is recommended that the patient needs of one knee amputation on the right as she has a life-threatening situation that can lead quickly to sepsis. The patient advised she would like to try to save her leg to which [name] apparently told her that this was against all medical advice. She also want to discontinue the Wound VAC which had been initiated due to the fact that she wasn't pleased with how the wound was looking and subsequently she wanted to pursue applying Medihoney at that time. He stated that he did not believe that the right lower extremity was salvageable and that the patient understood but would still like to attempt hyperbaric option therapy if it could be  of  any benefit. She was therefore referred back to [place] for further evaluation. He plans to see her back next week. Upon inspection today patient has a significant amount purulent drainage noted from the wound at this point. The bone in the distal portion of her foot also appears to be extremely necrotic and spongy. When I push down on the bone it bubbles and seeps purulent drainage from deeper in the end of the foot. I do not think that this is likely going to heal very well at all and less aggressive surgical debridement were undertaken more than what I believe we can likely do here in our office. 09/12/2018; I have not seen this patient since the most recent hospitalization although she was in our clinic last week. I have reviewed some of her records from a complex hospitalization. 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 There is situation was complicated by MRSA bacteremia and acute on chronic renal failure now on dialysis. She is receiving vancomycin at dialysis. We started her on Dakin's wet-to- dry last week she is changing this daily. There is still purulent drainage coming out of her foot. Although she is apparently "agreeable" to a below-knee amputation which is been suggested by multiple clinicians she wants this to be done in [place]. She apparently has a telehealth visit with that provider sometime in late [place] 6/24. I have told her I think this is probably too long. Nevertheless I could not convince her to allow a local doctor to perform BKA. 09/19/2018; the patient has a large necrotic area on the right anterior foot. She has had previous transmetatarsal amputations. Culture I did last week showed MRSA nothing else she is on vancomycin at dialysis. She has continued leaking purulent drainage out of the distal part of the large circular wound on the right anterior foot. She apparently went to see Dr. 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 Somehow that translated into her being referred to plastic surgery for debridement of the area. I gather she basically refused amputation although I do not have a copy of Dr. Berenice Primas notes. The patient really wants to have a trial of hyperbaric oxygen. I agreed with initial assessment in this clinic that this was probably too far along to benefit however if she is going to have plastic surgery I think she would benefit from ancillary hyperbaric oxygen. The issue here is that the patient has benefited as maximally as any patient I have ever seen from hyperbaric oxygen therapy. Most recently she had exposed bone on the lateral part of her left foot after a surgical procedure and that actually has closed. She has eschared areas in both heels but no open area. She is remained systemically well. I am not optimistic that anything can be done about this but the patient is very clear that she wants an attempt. The attempt would include a wound VAC further debridements and hyperbaric oxygen along with IV antibiotics. 6/26; I put her in for a trial of hyperbaric oxygen only because of the dramatic response she has had with wounds on her left midfoot earlier this year which was a surgical wound that went straight to her bone over the metatarsal heads and also remotely the left third toe. We will see if we can get this through our review process and insurance. She arrives in clinic with again purulent material pouring out of necrotic bone on the top of the foot distally.  There is also some concerning erythema on the front of the leg that we marked. It is bit difficult to tell how tender this is because of neuropathy. I note from infectious disease that she had her vancomycin extended. 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 She had the wound VAC on for part of the week. The rest of the time she is putting various things on this  including Medihoney, "ionized water" silver sorb gel etc. 7/7; follow-up along with HBO. She is still on vancomycin at dialysis. She has a large open area on the dorsal right foot and a small dark eschar area on her heel. There is a lot less erythema in the area and a lot less tenderness. From an infection point of view I think this is better. She still has a lot of necrosis in the remaining right forefoot [previous TMA] we are still using the wound VAC in this area 7/16; follow-up along with HBO. I put her on linezolid after she finished her vancomycin. We started this last Friday I gave her 2 weeks worth. I had the expectation that she would be operatively debrided by Dr. Marla Roe but that still has not happened yet. Patient phoned the office this week. She arrives for review today after HBO. The distal part of this wound is completely necrotic. Nonviable pieces of tendon bone was still purulent drainage. Also concerning that she has black eschar over the heel that is expanding. I think this may be indicative of infection in this area as well. She has less erythema and warmth in the ankle and calf but still an abnormal exam 7/21 follow-up along with HBO. I will renew her linezolid after checking a CBC with differential monitoring her blood counts especially her platelets. She was supposed to have surgery yesterday but if I am reading things correctly this was canceled after her blood sugar was found to be over 500. I thought Dr. Marla Roe who called me said that they were sending her to the ER but the patient states that was not the case. 7/28. Follow-up along with HBO. She is on linezolid I still do not have any lab work from dialysis even though I called last week. The patient is concerned about an area on her left lateral foot about the level of the base of her fifth metatarsal. I did not really see anything that ominous here however this patient is in [place] ability to point out problems  that she is sensing and she has been accurate in the past Finally she received a call from Dr. Marla Roe who is referring her to another orthopedic surgeon stating that she is too booked up to take her to the operating room now. Was still using a wound VAC on the foot 8/3 -Follow-up after HBO, she is got another week of linezolid, she is to call ID for an appointment, x-rays of both feet were reviewed, the left foot x-ray with third MTP joint osteo- Right foot x-ray widespread osteo-in the right midfoot Right ankle x-ray does not show any active evidence of infection 8/11-Patient is seen after HBO, the wounds on the right foot appear to be about the same, the heel wound had some necrotic base over tendon that was debrided with a curette 8/21; patient is seen after HBO. The patient's wound on her dorsal foot actually looks reasonably good and there is substantial amount of epithelialization however the open area distally still has a lot of necrotic debris partially  bone. I cannot really get a good sense of just how deep this probes under the foot. She has been pressuring me this week to order medical maggots through a company in [place] for her. The problem I have is there is not a defined wound area here. On the positive side there is no purulence. She has been to see infectious disease she is still on Septra DS although I have not had a chance to review their notes 8/28; patient is seen in conjunction with HBO. The wounds on her foot continued to improve including the right dorsal foot substantially the, the distal part of this wound and the area on the right heel. We have been using a wound VAC over this chronically. She is still on trimethoprim as directed by infectious disease 9/4; patient is seen in conjunction with HBO. Right dorsal foot wound substantially anteriorly is better however she continues to have a deep wound in the distal part of this that is not responding. We have been  using silver collagen under border foam Area on the right plantar medial heel seems better. We have been using Hydrofera Blue 12/12/18 on evaluation today patient appears to be doing about the same with regard to her wound based on prior measurements. She does have some necrotic tissue noted on the lateral aspect of the wound that is going require a little bit of sharp debridement today. This includes what appears to be potentially either severely necrotic bone or tendon. Nonetheless other than that she does not appear to have any severe infection which is good news 9/18; it is been 2 weeks since I saw this wound. She is tolerating HBO well. Continued dramatic improvement in the area on the right dorsal foot. She still has a small wound on the heel that we have been using Hydrofera Blue. She continues with a wound VAC 9/24; patient has to be seen emergently today with a swelling on her right lateral lower leg. She says that she told Dr. Evette Doffing about this and also myself on a couple of occasions but I really have no recollection of this. She is not systemically unwell and her wound really looked good the last time I saw this. She showed this to providers at dialysis and she was able to verify that she was started on cephalexin today for 5 doses at dialysis. She dialyzes on Tuesday Thursday and Saturday. 10/2; patient is seen in conjunction with HBO. The area that is draining on the right anterior medial tibia is more extensive. Copious amounts of serosanguineous drainage with some purulence. We are still using the wound VAC on the original wound then it is stable. Culture I did of the original IandD showed MRSA I contacted dialysis she is now on vancomycin with dialysis treatments. I asked them to run a month 10/9; patient seen in conjunction with HBO. She had a new spontaneous open area just above the wound on the right medial tibia ankle. More swelling on the right medial tibia. Her wound on the  foot looks about the same perhaps slightly better. There is no warmth spreading up her leg but no obvious erythema. her MRI of the foot and ankle and distal tib-fib is not booked for next Friday I discussed this with her in great detail over multiple days. it is likely she has spreading infection upper leg at least involving the distal 25% above the ankle. She knows that if I refer her to orthopedics for infectious disease they are going to  recommend amputation and indeed I am not against this myself. We had a good trial at trying to heal the foot which is what she wanted along with antibiotics debridement and HBO however she clearly has spreading infection [probably staph aureus/MRSA]. Nevertheless she once again tells me she wants to wait the left of the MRI. She still makes comments about having her amputation done in [place]. 10/19; arrives today with significant swelling on the lateral right leg. Last culture I did showed Klebsiella. Multidrug- resistant. Cipro was intermediate sensitivity and that is what I have her on pending her MRI which apparently is going to be done on Thursday this week although this seems to be moving back and forth. She is not systemically unwell. We are using silver alginate on her major wound area on the right medial foot and the draining areas on the right lateral lower leg 10/26; MRI showed extensive abscess in the anterior compartment of the right leg also widespread osteomyelitis involving osseous structures of the midfoot and portions of the hindfoot. Also suspicion for osteomyelitis anterior aspect of the distal medial malleolus. Culture I did of the purulence once again showed a multidrug-resistant Klebsiella. I have been in contact with nephrology late last week and she has been started on cefepime at dialysis to replace the vancomycin We sent a copy of her MRI report to Dr. Geroge Baseman in [place] who is an orthopedic surgeon. The patient takes  great stock in his opinion on this. She says she will go to [place] to have her leg amputated if Dr. Geroge Baseman does not feel there is any salvage options. 11/2; she still is not talk to her orthopedic surgeon in [place]. Apparently he will call her at [phone] this afternoon. The quality of this is she has not allowed me to refer her anywhere. She has been told over and over that she needs this amputated but has not agreed to be referred. She tells me her blood sugar was 600 last night but she has not been febrile. 11/9; she never did got a call from the orthopedic surgeon in [place] therefore that is off the radar. We have arranged to get her see orthopedic surgery at [facility]. She still has a lot of draining purulence coming out of the new abscess in her right leg although that probably came from the osteomyelitis in her right foot and heel. Meanwhile the original wound on the right foot looks very healthy. Continued improvement. The issue is that the last MRI showed osteomyelitis in her right foot extensively she now has an abscess in the right anterior lower leg. There is nobody in [place] who will offer this woman anything but an amputation and to be honest that is probably what she needs. I think she still wants to talk about limb salvage although at this point I just do not see that. She has completed her vancomycin at dialysis which was for the original staph aureus she is still on cefepime for the more recent Klebsiella. She has had a long course of both of these antibiotics which should have benefited the osteomyelitis on the right foot as well as the abscess. 11/16; apparently [place] elective surgery is shut down because of COVID-19 pandemic. I have reached out to some contacts at [facility] to see if we can get her an orthopedic appointment there. I am concerned about continually leaving this but for the moment everything is static. In fact her original large wound on this  foot is closing down. It is  the abscess on the right anterior leg that continues to drain purulent serosanguineous material. She is not currently on any antibiotics however she had a prolonged course of vancomycin [1 month] as well as cefepime for a month 02/24/2019 on evaluation today patient appears to be doing better than the last time I saw her. This is not a patient that I typically see. With that being said I am covering for Dr. Dellia Nims this week and again compared to when I last saw her overall the wounds in particular seem to be doing significantly better which is good news. With that being said the patient tells me several disconcerting things. She has not been able to get in to see anyone for potential debridement in regard to her leg wounds although she tells me that she does not think it is necessary any longer because she is taking care of that herself. She noticed a string coming out of the lower wound on her leg over the last week. The patient states that she subsequently decided that we must of pack something in there and started pulling the string out and as it kept coming and coming she realized this was likely her tendon. With that being said she continued to remove as much of this as she could. She then I subsequently proceeded to using tubes of antibiotic ointment which she will stick down into the wound and then scored as much as she can until she sees it coming out of the other wound opening. She states that in doing this she is actually made things better and there is less redness and irritation. With regard to her foot wound she does have some necrotic tendon and tissue noted in one small corner but again the actual wound itself seems to be doing better with good granulation in general compared to my last evaluation. 12/7; continued improvement in the wound on the substantial part of the right medial foot. Still a necrotic area inferiorly that required debridement but the  rest of this looks very healthy and is contracting. She has 2 wounds on the right lateral leg which were her original drainage sites from her abscess but all of this looks a lot better as well. She has been using silver alginate after putting antibiotic biotic ointment in one wound and watching it come out the other. I have talked to her in some detail today. I had given her names of orthopedic surgeons at [facility] for second opinion on what to do about the right leg. I do not think the patient never called them. She has not been able to get a hold of the orthopedic surgeon in [place] that she had put a lot of faith in as being somebody would give her an opinion that she would trust. I talked to her today and said even if I could get her in to another orthopedic surgeon about the leg which she accept an amputation and she said she would not therefore I am not going to press this issue for the moment Electronic Signature(s) Signed: 03/10/2019 10:01:17 AM By: Linton Ham MD Entered By: Linton Ham on 03/09/2019 10:21:20 -------------------------------------------------------------------------------- Physical Exam Details Patient Name: Date of Service: [name] 03/09/2019 8:45 AM Medical Record Number:[id number] Patient Account Number: [id number] Date of Birth/Sex: Treating RN: [birth year] (48 y.o. F) Primary Care Provider: [name] Other Clinician: Referring Provider: Treating Provider/Extender:, [name], [name] in Treatment: 26 Constitutional Sitting or standing Blood Pressure is within target range for patient.. Pulse regular  and within target range for patient.Marland Kitchen Respirations regular, non-labored and within target range.. Temperature is normal and within the target range for the patient.Marland Kitchen Appears in no distress. Eyes Conjunctivae clear. No discharge.no icterus. Respiratory work of breathing is normal. Cardiovascular Pedal pulses palpable  on the right. Notes Wound exam The original surgical wound which was at 1 point purulent with exposed bone is now down to a very nice granulated area. Continues to make nice progress. Inferiorly necrotic debris at the lower part of it I debrided with a #3 curette. I was able to get most of this removed there is some depth in this small area She has the areas which were the original IandD's for the abscess on her right lateral leg. Both of these appear to be healing as well. Electronic Signature(s) Signed: 03/10/2019 10:01:17 AM By: Linton Ham MD Entered By: Linton Ham on 03/09/2019 10:23:38 -------------------------------------------------------------------------------- Physician Orders Details Patient Name: Date of Service: [name] 03/09/2019 8:45 AM Medical Record Number:[id number] Patient Account Number: [id number] Date of Birth/Sex: Treating RN: 11/10/1970 (48 y.o. Nancy Fetter Primary Care Provider: [name] Other Clinician: Referring Provider: Treating Provider/Extender:, [name], [name] in Treatment: 52 Verbal / Phone Orders: No Diagnosis Coding ICD-10 Coding Code Description M86.671 Other chronic osteomyelitis, right ankle and foot L97.514 Non-pressure chronic ulcer of other part of right foot with necrosis of bone E10.621 Type 1 diabetes mellitus with foot ulcer L02.415 Cutaneous abscess of right lower limb L97.812 Non-pressure chronic ulcer of other part of right lower leg with fat layer exposed Follow-up Appointments Return Appointment in 1 week. Dressing Change Frequency Wound #43 Right,Medial Foot Change dressing three times week. Wound #44 Right Calcaneus Change dressing three times week. Wound #46 Right,Lateral Lower Leg Change dressing three times week. Wound #47 Right,Anterior Lower Leg Change dressing three times week. Wound Cleansing Clean wound with Wound Cleanser - Anasept to all wounds Primary Wound  Dressing Wound #43 Right,Medial Foot Calcium Alginate with Silver Wound #44 Right Calcaneus Calcium Alginate with Silver Wound #46 Right,Lateral Lower Leg Calcium Alginate with Silver Wound #47 Right,Anterior Lower Leg Calcium Alginate with Silver Secondary Dressing Wound #43 Right,Medial Foot Kerlix/Rolled [name] ABD pad Wound #44 Right Calcaneus Kerlix/Rolled [name] Dry [name] Heel Cup Wound #46 Right,Lateral Lower Leg Kerlix/Rolled [name] ABD pad Wound #47 Right,Anterior Lower Leg Kerlix/Rolled [name] ABD pad Edema Control Elevate legs to the level of the heart or above for 30 minutes daily and/or when sitting, a frequency of: - throughout the day [facility] for wound care. - Interim Electronic Signature(s) Signed: 03/09/2019 5:51:44 PM By: [name] RN, BSN Signed: 03/10/2019 10:01:17 AM By: Linton Ham MD Entered By: [name] on 03/09/2019 09:44:43 -------------------------------------------------------------------------------- Problem List Details Patient Name: Date of Service: [name] 03/09/2019 8:45 AM Medical Record Number:[id number] Patient Account Number: [id number] Date of Birth/Sex: Treating RN: 1970/10/10 (48 y.o. Nancy Fetter Primary Care Provider: [name] Other Clinician: Referring Provider: Treating Provider/Extender:, [name], [name] in Treatment: 26 Active Problems ICD-10 Evaluated Encounter Code Description Active Date Today Diagnosis M86.671 Other chronic osteomyelitis, right ankle and foot 09/03/2018 No Yes L97.514 Non-pressure chronic ulcer of other part of right foot 09/03/2018 No Yes with necrosis of bone E10.621 Type 1 diabetes mellitus with foot ulcer 09/24/2018 No Yes L02.415 Cutaneous abscess of right lower limb 12/25/2018 No Yes L97.812 Non-pressure chronic ulcer of other part of right lower 02/24/2019 No Yes leg with fat layer exposed Inactive  Problems Resolved  Problems Electronic Signature(s) Signed: 03/10/2019 10:01:17 AM By: Linton Ham MD Entered By: Linton Ham on 03/09/2019 10:16:55 -------------------------------------------------------------------------------- Progress Note Details Patient Name: Date of Service: [name] 03/09/2019 8:45 AM Medical Record Number:[id number] Patient Account Number: [id number] Date of Birth/Sex: Treating RN: 01/04/1971 (48 y.o. F) Primary Care Provider: [name] Other Clinician: Referring Provider: Treating Provider/Extender:, [name], [name] in Treatment: 26 Subjective History of Present Illness (HPI) 48 year old diabetic who is known to have type 1 diabetes which is poorly controlled last hemoglobin A1c was 11%. She comes in with a ulcerated area on the left lateral foot which has been there for over 6 months. Was recently she has been treated by [name] of podiatry who saw her last on 05/28/2016. Review of his notes revealed that the patient had incision and drainage with placement of antibiotic beads to the left foot on 04/11/2016 for possible osteomyelitis of the cuboid bone. Over the last year she's had a history of amputation of the left fifth toe and a femoropopliteal popliteal bypass graft somewhere in April 2017. 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 His note [name] mentions that the patient has been referred to me for further wound care and possibly great candidate for hyperbaric oxygen therapy due to recurrent osteomyelitis. However we do not have any x-rays of biopsy reports confirming this. He has been on several antibiotics including Bactrim and most recently is on doxycycline for an MRSA. I understand, the patient was not a candidate for IV antibiotics as she has had previous PICC lines which resulted in blood clots in both arms. There was a x-ray report dated 04/04/2016 on [name] notes which showed  evidence of fifth ray resection left foot with osteolytic changes noted to the fourth metatarsal and cuboid bone on the left. 06/13/2016 -- had a left foot x-ray which showed no acute fracture or dislocation and no definite radiographic evidence of osteomyelitis. Advanced osteopenia was seen. 06/20/2016 -- she has noticed a new wound on the right plantar foot in the region where she had a callus before. 06/27/16- the patient did have her x-ray of the right foot which showed no findings to suggest osteomyelitis. She saw her endocrinologist, [name], yesterday. Her A1c in January was 11. He also indicates mismanagement and noncompliance regarding her diabetes. She is currently on Bactrim for a lip infection. She is complaining of nausea, vomiting and diarrhea. She is unable to articulate the exact orders or dosing of the Bactrim; it is unclear when she will complete this. 07/04/2016 -- results from [facility] of ABIs with ankle waveforms were noted from 02/14/2016. The examination done on 06/27/2015 showed noncompressible ABIs with the right being 1.45 and the left being 1.33. The present examination showed a right ABI of 1.19 on the left of 1.33. The conclusion was that right normal ABI in the lower extremity at rest however compared to previous study which was noncompressible ABI may be falsely elevated side suggesting medial calcification. The left ABI suggested medial calcification. 08/01/2016 -- the patient had more redness and pain on her right foot and did not get to come to see as noted she see her PCP or go to the ER and decided to take some leftover metronidazole which she had at home. As usual, the patient does report she feels and is rather noncompliant. 08/08/2016 -- -- x-ray of the right foot -- FINDINGS:Transmetatarsal amputation is noted. No bony destruction is noted to suggest osteomyelitis. IMPRESSION: No evidence of  osteomyelitis. Postsurgical changes are seen. MRI would be  more sensitive for possible bony changes. Culture has grown Serratia Marcescens -- sensitive to Bactrim, ciprofloxacin, ceftazidime she was seen by [name] on 08/06/2016. He did not find any exposed bone, muscle, tendon, ligament or joint. There was no malodor and he did a excisional debridement in the office. ============ Old notes: 48 year old patient who is known to the wound clinic for a while had been away from the wound clinic since 09/01/2014. Over the last several months she has been admitted to various hospitals including [facility]. She was treated for a right metatarsal osteomyelitis with a transmetatarsal amputation and this was done about 2 months ago. He has a small ulcerated area on the right heel and she continues to have an ulcerated area on the left plantar aspect of the foot. The patient was recently admitted to the [facility] hospital group between 7/12 and 10/18/2014. she was given 3 weeks of IV vancomycin and was to follow-up with her surgeons at [facility] and also took oral vancomycin for C. difficile colitis. 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 MRI of the left foot done on 10/14/2014 showed no abscess or osteomyelitis. 04/27/15; this is a patient we know from previous stays in the wound care center. She is a type I diabetic I am not sure of her control currently. Since the last time I saw her she is had a right transmetatarsal amputation and has no wounds on her right foot and has no open wounds. She is been followed at the wound care center at [facility] in [place]. She comes today with the desire to undergo hyperbaric treatment locally. Apparently one of her  wound care providers in [place] has suggested hyperbarics. This is in response to an MRI from 04/18/15 that showed increased marrow signal and loss of the proximal fifth metatarsal cortex evidence of osteomyelitis with likely early osteomyelitis in the cuboid bone as well. She has a large wound over the base of the fifth metatarsal. She also has a eschar over her the tips of her toes on 1,3 and 5. She does not have peripheral pulses and apparently is going for an angiogram tomorrow which seems reasonable. After this she is going to infectious disease at [facility]. They have been using Medihoney to the large wound on the lateral aspect of the left foot to. The patient has known Charcot deformity from diabetic neuropathy. She also has known diabetic PAD. Surprisingly I can't see that she has had any recent antibiotics, the patient states the last antibiotic she had was at the end of November for 10 days. I think this was in response to culture that showed group G strep although I'm not exactly sure where the culture was from. She is also had arterial studies on 03/29/15. This showed a right ABI of 1.4 that was noncompressible. Her left ABI was 0.73. 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 It was not felt that arterial inflow was adequate for healing of a foot ulcer. Her Doppler waveforms looked monophasic ===== READMISSION 02/28/17; this is in an now 48 year old woman we've had at several different occasions in this clinic. 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 She  has a remote ex-smoker. She was last seen in this clinic by Dr. Con Memos I think in May. More recently she is been followed by her podiatrist [name] an infectious disease Dr. Megan Salon. She has 2 open wounds the major one is over the right first metatarsal head she also has a wound on the left plantar foot. an MRI of the right foot on 01/01/17 showed a soft  tissue ulcer along the plantar aspect of the first metatarsal base consistent with osteomyelitis of the first metatarsal stump. Dr. 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 According to the patient this is been open for slightly over a month. She has been on a combination of Cipro 500 twice a day, Zyvox 600 twice a day and Flagyl 500 3 times a day for over a month now as directed by Dr. Megan Salon. cultures of the right foot earlier this year showed MRSA in January and Serratia in May. January also had a few viridans strep. Recent x-rays of both feet were done and [name] office and I don't have these reports. The patient has known PAD and has a history of aleft femoropopliteal bypass in April 2017. 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 Dwyane Dee. In spite of this the last hemoglobin A1c I can see is 10.1 on 01/01/2017. She is being referred by 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 She is been using Medihoney to this area and also an area on the left midfoot. She is using healing sandals bilaterally. ABIs in this clinic at the left posterior tibial was 1.1 noncompressible on the right READMISSION Non invasive vascular NOVANT 5/18 Aftercare following surgery of the circulatory system Procedure Note - Interface, External Ris In - 08/13/2016 11:05 AM EDT Procedure: Examination consists of physiologic resting arterial pressures of the brachial and ankle arteries bilaterally with continuous wave Doppler waveform analysis. Previous: Previous exam performed on 02/14/16 demonstrated ABIs of Rt = 1.19 and Lt = 1.33. Right: ABI = non-compressible PT, 1.47 DP. S/P transmet  amputation. Left: ABI = 1.52, 2nd digit pressure = 87 mmHg Conclusions: Right: ABI (>1.3) may be falsely elevated, suggesting medial calcification. Left: ABI (>1.3) may be falsely elevated, suggesting medial calcification The patient is a now 48 year old type I diabetic is had multiple issues her graded to chronic diabetic foot ulcers. 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 We had her in the clinic lastin November. At that point she had wounds on her bilateral feet.she had wanted to try hyperbarics however the healogics review process denied her because she hadn't followed up with her vascular surgeon for her left femoropopliteal bypass. The bypass was done by Dr. Raul Del at [facility]. We made her a follow-up with Dr. Raul Del however she did not keep the appointment and therefore she was not approved The patient shows me a small wound on her left fourth metatarsal head on her phone. She developed rapid discoloration in the plantar aspect of the left foot and she was admitted to hospital from 2/2 through 05/10/17 with wet gangrene of the left foot osteomyelitis of the fourth metatarsal heads. She was admitted acutely ill with a temperature of 103.  She was started on broad-spectrum vancomycin and cefepime. On 05/06/17 she was taken to the OR by [name] her podiatric surgeon for an incision and drainage irrigation of the left foot wound. Cultures from this surgery revealed group be strep and anaerobes. she was seen by Dr.Xu of orthopedic surgery and scheduled for a below-knee amputation which she u refused. Ultimately she was discharged on Levaquin and Flagyl for one month. MRI 05/05/17 done while she was in the hospital showed abscess adjacent to the fourth metatarsal head and neck small abscess around the fourth flexor tendon. Inflammatory phlegmon and gas in the soft tissues along the lateral aspect of the  fourth phalanx. Findings worrisome for osteomyelitis involving the fourth proximal and middle phalanx and also the third and fourth metatarsals. Finally the patient had actually shortly before this followed up with Dr. Raul Del at no time on 04/29/17. He felt that her left femoropopliteal bypass was patent he felt that her left-sided toe pressures more than adequate for healing a wound on the left foot. This was before her acute presentation. 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 05/28/17; she is started hyperbarics. The patient tells me that for some reason she was not actually on Levaquin but I think on ciprofloxacin. She was on Flagyl. She only started her Levaquin yesterday due to some difficulty with the pharmacy and perhaps her sister picking it up. She has an appointment with [name] tomorrow and with infectious disease early next week. She has no new complaints 06/06/17; the patient continues in hyperbarics. She saw [name] on 05/29/17 who is her podiatric surgeon. He is elected for a transmetatarsal amputation on 06/27/17. I'm not sure at what level he plans to do this amputation. The patient is unaware ooShe also saw Dr. Megan Salon of infectious disease who elected to continue her on current antibiotics I think this is ciprofloxacin and Flagyl. I'll need to clarify with her tomorrow if she actually has this. We're using silver alginate to the actual wound. Necrotic surface today with material under the flap of her foot. ooOriginal MRI showed abscesses as well as osteomyelitis of the proximal and middle fourth phalanx and the third and fourth metatarsal heads 06/11/17; patient continues in hyperbarics and continues on oral antibiotics. She is doing well. The wound looks better. The necrotic part of this under the flap in her superior foot also looks better. she is been to see [name]. I haven't had a chance to look at his note. Apparently he has put the transmetatarsal amputation on  hold her request it is still planning to take her to the OR for debridement and product application ACEL. I'll see if I can find his note. I'll therefore leave product ordering/requests to [name] for now. I was going to look at Dermagraft 06/18/17-she is here in follow-up evaluation for bilateral foot wounds. She continues with hyperbaric therapy. She states she has been applying manuka honey to the right plantar foot and alternate manuka honey and silver alginate to the left foot, despite our orders. We will continue with same treatment plan and she will follo up next week. 06/25/17; I have reviewed [name] last note from 3/11. She has operative debridement in 2 days' time. By review his note apparently they're going to place there is skin over the majority of this wound which is a good choice. She has a small satellite area at the most proximal part of this wound on the left plantar foot. The area on the right  plantar foot we've been using silver alginate and it is close to healing. 07/02/17; unfortunately the patient was not easily approved for [name] proposed surgery. I'm not completely certain what the issue is. She has been using silver alginate to the wound she has completed a first course of hyperbarics. She is still on Levaquin and Flagyl. I have really lost track of the time course here.I suspect she should have another week to 2 of antibiotics. I'll need to see if she is followed up with infectious disease Dr. Megan Salon 07/09/17; the patient is followed up with Dr. Megan Salon. 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 She continues on Levaquin and metronidazole continuing both of these for now I think she is been on fr about 6 weeks. She still has some drainage but no pain. No fever. Her had been plans for her to go to the OR for operative debridement with her podiatrist [name], I am not exactly sure where that is. I'll probably slip a note  to [name] today. I note that she follows with Dr. Dwyane Dee of endocrinology. We have her recertified for hyperbaric oxygen. I have not heard about Dermagraft however I'll see if [name] is planning a skin substitute as well 07/16/17; the patient tells me she is just about out of [place]. I'll need to check Dr. Hale Bogus last notes on this. She states she has plenty of Flagyl however. She comes in today complaining of pain in the right lateral foot which she said lasted for about a day. The wound on the right foot is actually much more medially. She also tells me that the [facility] cost a lot of pain in the left foot wound and she turned back to silver alginate. Finally Dermagraft has a $374 per application co-pay. She cannot afford this 07/23/17; patient arrives today with the wound not much smaller. There is not much new to add. She has not heard from [name] all try to put in a call to them today. She was asking about Dermagraft again and she has an over $827 per application co-pay she states that she would be willing to try to do a payment plan. I been tried to avoid this. We've been using silver alginate, I'll change to [facility] 07/30/17-She is here in follow-up evaluation for left foot ulcer. She continues hyperbaric medicine. The left foot ulcer is stable we will continue with same treatment plan 08/06/17; she is here for evaluation of her left foot ulcer. Currently being treated for hyperbarics or underlying osteomyelitis. She is completed antibiotics. The left foot ulcer is better smaller with healthier looking granulation. For various reasons I am not really clear on we never got her back to the OR with [name]. He did not respond to my secure text message. Nevertheless I think that surgery on this point is not necessary nor am I completely clear that a skin substitute is necessary The patient is complaining about pain on the outside of her right foot. 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 There is no erythema. She also states the foot is warm versus her other part of her upper leg and this is largely true. It is not totally clear to me what's causing this. She thinks it's different from her usual neuropathy pain 08/13/17; she arrives in clinic today with a small wound which is superficial on her right first metatarsal head. 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 She tells [place] she was up on her feet over the Mother's Day celebration. ooThe large wound is on the left foot. Continues with hyperbarics for underlying osteomyelitis. We're using Hydrofera Blue. She asked me today about where we were with Dermagraft. I had actually excluded this because of the co- pay however she wants to assume this therefore I'll recheck the co-pay an order for next week. 08/20/17; the patient agreed to accept the co-pay of the first Apligraf which we applied today. She is disappointed she is finishing hyperbarics will run this through the insurance on the extent of the foot infection and the extent of the wound that she had however she is already had 60 dive's. Dermagraft No. 1 08/27/17; Dermagraft No. 2. She is not eligible for any more hyperbaric treatments this month. She reports a fair amount of drainage and she actually changed to the external dressings without disturbing the direct contact layer 09/03/17; the patient arrived in clinic today with the wound superficially looking quite healthy. Nice vibrant red tissue with some advancing epithelialization although not as much adherence of the flap as I might like. However she noted on her own fourth toe some bogginess and she brought that to our attention. Indeed this was boggy feeling like a possibility of subcutaneous fluid. 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 She is not been unwell. We've been using  Dermagraft 09/10/17; the culture that I did not last week was MRSA. She saw Dr. Megan Salon this morning who is going to start her on vancomycin. I had sent him a secure a text message yesterday. I also spoke with her podiatric surgeon [name] about surgery on this foot the options for conserving a functional foot etc. Promised me he would see her and will make back consultation today. Paradoxically her actual wound on the plantar aspect of her left foot looks really quite good. I had given her 5 days worth of Baxdella to cover her for MRSA. Her MRI came back showing osteomyelitis within the third metatarsal shaft and head and base of the third and fourth proximal phalanx. She had extensive inflammatory changes throughout the soft tissue of the lateral forefoot. With an ill-defined fluid around the fourth metatarsal extending into the plantar and dorsal soft tissues 09/19/17; the patient is actually on oral Septra and Flagyl. She apparently refused IV vancomycin. She also saw [name] at my request who is planning her for a left BKA sometime in mid July. MRI showed osteomyelitis within the third metatarsal shaft and head and the basis of the third and fourth proximal phalanx. I believe there was felt to be possible septic arthritis involving the third MTP. 09/26/17; the patient went back to Dr. Megan Salon at my suggestion and is now receiving IV daptomycin. Her wound continues to look quite good making the decision to proceed with a transmetatarsal amputation although more difficult for the patient. I believe in my extensive discussions with her she has a good sense of the pros and cons of this. I don't NV the tuft decision she has to make. She has an appointment with [name] I believe in mid July and I previously spoken to him about this issue Has we had used 3 previous Dermagraft. Given the condition of the wound surface I went ahead and added the fourth one today area and I did this not fully  realizing that she'll  be traveling to [place] next week. I'm hopeful she can come back in 2 weeks 10/21/17; Her same Dermagraft on for about 3-1/2 weeks. In spite of this the wound arrives looking quite healthy. There is been a lot of healing dimensions are smaller. Looking at the square shaped wound she has now there is some undermining and some depth medially under the undermining although I cannot palpate any bone. No surrounding infection is obvious. She has difficult questions about how to look at this going forward vis--vis amputations versus continued medical therapy. To be truthful the wound is looks so healthy and it is continued to contract. Hard to justify foot surgery at this point although I still told her that I think it might come to that if we are not able to eradicate the underlying MRSA. She is still highly at risk and she understands this 11/06/17 on evaluation today patient appears to be doing better in regard to her foot ulcer. She's been tolerating the dressing changes without complication. Currently she is here for her Dermagraft #6. Her wound continues to make excellent progress at this point. She does not appear to have any evidence of infection which is good news. 11/13/17 on evaluation today patient appears to be doing excellent at this time. She is here for repeat Dermagraft application. This is #7. Overall her wound seems to be making great progress. 12/05/17; the patient arrives with the wound in much better condition than when I last saw this almost 6 weeks ago. She still has a small probing area in the left metatarsal head region on the lateral aspect of her foot. We applied her last Dermagraft today. ooSince the last time she is here she has what appears to a been a blood blister on the plantar aspect of left foot although I don't see this is threatening. There is also a thick raised tissue on the right mid metatarsal head region. This was not there I don't think the  last time she was here 3 weeks ago. 12/12/17; the patient continues to have a small programming area in the left metatarsal head region on the lateral aspect of her foot which was the initial large surgical wound. I applied her last Apligraf last week. I'm going to use Endoform starting today ooUnfortunately she has an excoriated area in the left mid foot and the right mid foot. The left midfoot looks like a blistered area this was not opened last week it certainly is open today. Using silver alginate on these areas. She promises me she is offloading this. 12/19/17; the small probing area in the left metatarsal head eyes think is shallower. In general her original wound looks better. We've been using Endoform. The area inferiorly that I think was trauma last week still requires debridement a lot of nonviable surface which I removed. She still has an open open area distally in her foot ooSimilarly on the right foot there is tightly adherent surface debris which I removed. Still areas that don't look completely epithelialized. This is a small open area. We used silver alginate on these areas 12/26/2017; the patient did not have the supplies we ordered from last week including the Endoform. The original large wound on the left lateral foot looks healthy. She still has the undermining area that is largely unchanged from last week. She has the same heavily callused raised edged wounds on the right mid and left midfoot. Both of these requiring debridement. We have been using silver alginate on these areas 01/02/2018;  there is still supply issues. We are going to try to use Prisma but I am not sure she actually got it from what she is saying. She has a new open area on the lateral aspect of the left fourth toe [previous fifth ray amputation]. Still the one tunneling area over the fourth metatarsal head. The area is in the midfoot bilaterally still have thick callus around them. She is concerned about a  raised swelling on the lateral aspect of the foot. However she is completely insensate 01/10/2018; we are using Prisma to the wounds on her bilateral feet. Surprisingly the tunneling area over the left fourth metatarsal head that was part of her original surgery has closed down. She has a small open area remaining on the incision line. 2 open areas in the midfoot. 02/10/2018; the patient arrives back in clinic after a month hiatus. She was traveling to visit family in [place]. Is fairly clear she was not offloading the areas on her feet. The original wound over the left lateral foot at the level of metatarsal heads is reopened and probes medially by about a centimeter or 2. She notes that a week ago she had purulent drainage come out of an area on the left midfoot. Paradoxically the worst area is actually on the right foot is extensive with purulent drainage. We will use silver alginate today 02/17/2018; the patient has 3 wounds one over the left lateral foot. She still has a small area over the metatarsal heads which is the remnant of her original surgical wound. This has medial probing depth of roughly 1.4 cm somewhat better than last week. The area on the right foot is larger. We have been using silver alginate to all areas. The area on the right foot and left foot that we cultured last week showed both Klebsiella and Proteus. Both of these are quinolone sensitive. The patient put her's self on Bactrim and Flagyl that she had left hanging around from prior antibiotic usages. She was apparently on this last week when she arrived. I did not realize this. Unfortunately the Bactrim will not cover either 1 of these organisms. We will send in Cipro 500 twice daily for a week 03/04/2018; the patient has 2 wounds on the left foot one is the original wound which was a surgical wound for a deep DFU. At one point this had exposed bone. She still has an area over the fourth metatarsal head that  probes about 1.4 cm although I think this is better than last week. I been using silver nitrate to try and promote tissue adherence and been using silver alginate here. ooShe also has an area in the left midfoot. This has some depth but a small linear wound. Still requiring debridement. ooOn the right midfoot is a circular wound. A lot of thick callus around this area. ooWe have been using silver alginate to all wound areas ooShe is completed the ciprofloxacin I gave her 2 weeks ago. 03/11/2018; the patient continues to have 2 open areas on the left foot 1 of which was the original surgical wound for a deep DFU. Only a small probing area remains although this is not much different from last week we have been using silver alginate. The other area is on the midfoot this is smaller linear but still with some depth. We have been using silver alginate here as well ooOn the right foot she has a small circular wound in the mid aspect. This is not much smaller than last  time. We have been using silver alginate here as well 03/18/2018; she has 3 wounds on the left foot the original surgical wound, a very superficial wound in the mid aspect and then finally the area in the mid plantar foot. She arrives in today with a very concerning area in the wound in the mid plantar foot which is her most proximal wound. There is undermining here of roughly 1-1/2 cm superiorly. Serosanguineous drainage. She tells me she had some pain on for over the weekend that shot up her foot into her thigh and she tells me that she had a nodule in the groin area. ooShe has the single wound in the right foot. ooWe are using endoform to both wound areas 03/24/2018; the patient arrives with the original surgical wound in the area on the left midfoot about the same as last week. There is a collection of fluid under the surface of the skin extending from the surgical wound towards the midfoot although it does not reach the  midfoot wound. The area on the right foot is about the same. Cultures from last week of the left midfoot wound showed abundant Klebsiella abundant Enterococcus faecalis and moderate methicillin resistant staph I gave her Levaquin but this would have only covered the Klebsiella. She will need linezolid 04/01/2018; she is taking linezolid but for the first few days only took 1 a day. I have advised her to finish this at twice daily dosing. In any case all of her wounds are a lot better especially on the left foot. The original surgical wound is closed. The area on the left midfoot considerably smaller. The area on the right foot also smaller. 04/08/2018; her original surgical wound/osteomyelitis on the left foot remains closed. She has area on the left foot that is in the midfoot area but she had some streaking towards this. This is not connected with her original wound at least not visually. ooSmall wound on the right midfoot appears somewhat smaller. 04/15/18; both wounds looks better. Original wound is better left midfoot. Using silver alginate 1/21; patient states she uses saltwater soak in, stones or remove callus from around her wounds. She is also concerned about a blood blister she had on the left foot but it simply resolved on its own. We've been using silver alginate 1/28; the patient arrives today with the same streaking area from her metatarsals laterally [the site of her original surgical wound] down to the middle of her foot. There is some drainage in the subcutaneous area here. This concerns me that there is actually continued ongoing infection in the metatarsals probably the fourth and third. This fixates an MRI of the foot without contrast [chronic renal failure] ooThe wound in the mid part of the foot is small but I wonder whether this area actually connects with the more distal foot. ooThe area on the right midfoot is probably about the same. Callus thick skin around the small  wound which I removed with a curette we have been using silver alginate on both wound areas 2/4; culture I did of the draining site on the left foot last time grew methicillin sensitive staph aureus. MRI of the left foot showed interval resolution of the findings surrounding the third metatarsal joint on the prior study consistent with treated osteomyelitis. Chronic soft tissue ulceration in the plantar and lateral aspect of the forefoot without residual focal fluid collection. No evidence of recurrent osteomyelitis. Noted to have the previous amputation of the distal first phalanx and fifth  ray MRI of the right foot showed no evidence of osteomyelitis I am going to treat the patient with a prolonged course of antibiotics directed against MSSA in the left foot 2/11; patient continues on cephalexin. She tells me she had nausea and vomiting over the weekend and missed 2 days. In general her foot looks much the same. She has a small open area just below the left fourth metatarsal head. A linear area in the left midfoot. Some discoloration extending from the inferior part of this into the left lateral foot although this appears to be superficial. She has a small area on the right midfoot which generally looks smaller after debridement 2/18; the patient is completing his cephalexin and has another 2 days. She continues to have open areas on the left and right foot. 2/25; she is now off antibiotics. The area on the left foot at the site of her original surgical wound has closed yet again. She still has open areas in the mid part of her foot however these appear smaller. The area on the right mid foot looks about the same. We have been using silver alginate She tells me she had a serious hypoglycemic spell at home. She had to have EMS called and get IV dextrose 3/3; disappointing on the left lateral foot large area of necrotic tissue surrounding the linear area. This appears to track up towards the  same original surgical wound. Required extensive debridement. The area on the right plantar foot is not a lot better also using silver 3/12; the culture I did last time showed abundant enterococcus. I have prescribed Augmentin, should cover any unrecognized anaerobes as well. In addition there were a few MRSA and Serratia that would not be well covered although I did not want to give her multiple antibiotics. She comes in today with a new wound in the right midfoot this is not connected with the original wound over her MTP a lot of thick callus tissue around both wounds but once again she said she is not walking on these areas 3/17-Patient comes in for follow-up on the bilateral plantar wounds, the right midfoot and the left plantar wound. Both these are heavily callused surrounding the wounds. We are continuing to use silver alginate, she is compliant with offloading and states she uses a wheelchair fairly often at home 3/24; both wound areas have thick callus. However things actually look quite a bit better here for the majority of her left foot and the right foot. 3/31; patient continues to have thick callused somewhat irritated looking tissue around the wounds which individually are fairly superficial. There is no evidence of surrounding infection. We have been using silver alginate however I change that to [facility] today 4/17; patient returns to clinic after having a scare with Covid she tested negative in her primary doctor's office. She has been using Hydrofera Blue. She does not have an open area on the right foot. On the left foot she has a small open area with the mid area not completely viable. She showed me pictures of what looks like a hemorrhagic blister from several days ago but that seems to have healed over this was on the lateral left foot 4/21; patient comes in to clinic with both her wounds on her feet closed. However over the weekend she started having pain in her right  foot and leg up into the thigh. She felt as though she was running a low-grade fever but did not take her temperature. She took  a doxycycline that she had leftover and yesterday a single Septra and metronidazole. She thinks things feel somewhat better. 4/28; duplex ultrasound I ordered last week was negative for DVT or superficial thrombophlebitis. She is completed the doxycycline I gave her. States she is still having a lot of pain in the right calf and right ankle which is no better than last week. She cannot sleep. She also states she has a temperature of up to 101, coughing and complaining of visual loss in her bilateral eyes. Apparently she was tested for Covid 2 weeks ago at [facility] and that was negative. Readmission: 09/03/18 patient presents back for reevaluation after having been evaluated at the end of April regarding erythema and swelling of her right lower extremity. Subsequently she ended up going to the hospital on 07/29/18 and was admitted not to be discharged until 08/08/18. Unfortunately it was noted during the time that she was in the hospital that she did have methicillin-resistant Staphylococcus aureus as the infection noted at the site. It was also determined that she did have osteomyelitis which appears to be fairly significant. She was treated with vancomycin and in fact is still on IV vancomycin at dialysis currently. This is actually slated to continue until 09/12/18 at least which will be the completion of the six weeks of therapy. Nonetheless based on what I'm seeing at this point I'm not sure she will be anywhere near ready to discontinue antibiotics at that time. Since she was released from the hospital she was seen by [name] who is her podiatrist on 08/27/18. His note specifically states that he is recommended that the patient needs of one knee amputation on the right as she has a life-threatening situation that can lead quickly to sepsis. The patient advised she would  like to try to save her leg to which [name] apparently told her that this was against all medical advice. She also want to discontinue the Wound VAC which had been initiated due to the fact that she wasn't pleased with how the wound was looking and subsequently she wanted to pursue applying Medihoney at that time. He stated that he did not believe that the right lower extremity was salvageable and that the patient understood but would still like to attempt hyperbaric option therapy if it could be of any benefit. She was therefore referred back to [place] for further evaluation. He plans to see her back next week. Upon inspection today patient has a significant amount purulent drainage noted from the wound at this point. The bone in the distal portion of her foot also appears to be extremely necrotic and spongy. When I push down on the bone it bubbles and seeps purulent drainage from deeper in the end of the foot. I do not think that this is likely going to heal very well at all and less aggressive surgical debridement were undertaken more than what I believe we can likely do here in our office. 09/12/2018; I have not seen this patient since the most recent hospitalization although she was in our clinic last week. I have reviewed some of her records from a complex hospitalization. 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 There is situation was complicated by MRSA bacteremia and acute on chronic renal failure now on dialysis. She is receiving vancomycin at dialysis. We started her on Dakin's wet-to- dry last week she is changing this daily. There is still purulent drainage coming out of her foot.  Although she is apparently "agreeable" to a below-knee amputation which is been suggested by multiple clinicians she wants this to be done in [place]. She apparently has a telehealth visit with that provider sometime in late [place] 6/24. I have told her I  think this is probably too long. Nevertheless I could not convince her to allow a local doctor to perform BKA. 09/19/2018; the patient has a large necrotic area on the right anterior foot. She has had previous transmetatarsal amputations. Culture I did last week showed MRSA nothing else she is on vancomycin at dialysis. She has continued leaking purulent drainage out of the distal part of the large circular wound on the right anterior foot. She apparently went to see Dr. 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 Somehow that translated into her being referred to plastic surgery for debridement of the area. I gather she basically refused amputation although I do not have a copy of Dr. Berenice Primas notes. The patient really wants to have a trial of hyperbaric oxygen. I agreed with initial assessment in this clinic that this was probably too far along to benefit however if she is going to have plastic surgery I think she would benefit from ancillary hyperbaric oxygen. The issue here is that the patient has benefited as maximally as any patient I have ever seen from hyperbaric oxygen therapy. Most recently she had exposed bone on the lateral part of her left foot after a surgical procedure and that actually has closed. She has eschared areas in both heels but no open area. She is remained systemically well. I am not optimistic that anything can be done about this but the patient is very clear that she wants an attempt. The attempt would include a wound VAC further debridements and hyperbaric oxygen along with IV antibiotics. 6/26; I put her in for a trial of hyperbaric oxygen only because of the dramatic response she has had with wounds on her left midfoot earlier this year which was a surgical wound that went straight to her bone over the metatarsal heads and also remotely the left third toe. We will see if we can get this through our review process and insurance. She arrives  in clinic with again purulent material pouring out of necrotic bone on the top of the foot distally. There is also some concerning erythema on the front of the leg that we marked. It is bit difficult to tell how tender this is because of neuropathy. I note from infectious disease that she had her vancomycin extended. 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 She had the wound VAC on for part of the week. The rest of the time she is putting various things on this including Medihoney, "ionized water" silver sorb gel etc. 7/7; follow-up along with HBO. She is still on vancomycin at dialysis. She has a large open area on the dorsal right foot and a small dark eschar area on her heel. There is a lot less erythema in the area and a lot less tenderness. From an infection point of view I think this is better. She still has a lot of necrosis in the remaining right forefoot [previous TMA] we are still using the wound VAC in this area 7/16; follow-up along with HBO. I put her on linezolid after she finished her vancomycin. We started this last Friday I gave her 2 weeks worth. I had the expectation that she would be operatively debrided by Dr. Marla Roe  but that still has not happened yet. Patient phoned the office this week. She arrives for review today after HBO. The distal part of this wound is completely necrotic. Nonviable pieces of tendon bone was still purulent drainage. Also concerning that she has black eschar over the heel that is expanding. I think this may be indicative of infection in this area as well. She has less erythema and warmth in the ankle and calf but still an abnormal exam 7/21 follow-up along with HBO. I will renew her linezolid after checking a CBC with differential monitoring her blood counts especially her platelets. She was supposed to have surgery yesterday but if I am reading things correctly this was canceled after her blood sugar was found to be  over 500. I thought Dr. Marla Roe who called me said that they were sending her to the ER but the patient states that was not the case. 7/28. Follow-up along with HBO. She is on linezolid I still do not have any lab work from dialysis even though I called last week. The patient is concerned about an area on her left lateral foot about the level of the base of her fifth metatarsal. I did not really see anything that ominous here however this patient is in [place] ability to point out problems that she is sensing and she has been accurate in the past Finally she received a call from Dr. Marla Roe who is referring her to another orthopedic surgeon stating that she is too booked up to take her to the operating room now. Was still using a wound VAC on the foot 8/3 -Follow-up after HBO, she is got another week of linezolid, she is to call [facility] for an appointment, x-rays of both feet were reviewed, the left foot x-ray with third MTP joint osteo- Right foot x-ray widespread osteo-in the right midfoot Right ankle x-ray does not show any active evidence of infection 8/11-Patient is seen after HBO, the wounds on the right foot appear to be about the same, the heel wound had some necrotic base over tendon that was debrided with a curette 8/21; patient is seen after HBO. The patient's wound on her dorsal foot actually looks reasonably good and there is substantial amount of epithelialization however the open area distally still has a lot of necrotic debris partially bone. I cannot really get a good sense of just how deep this probes under the foot. She has been pressuring me this week to order medical maggots through a company in [place] for her. The problem I have is there is not a defined wound area here. On the positive side there is no purulence. She has been to see infectious disease she is still on Septra DS although I have not had a chance to review their notes 8/28; patient is seen in conjunction  with HBO. The wounds on her foot continued to improve including the right dorsal foot substantially the, the distal part of this wound and the area on the right heel. We have been using a wound VAC over this chronically. She is still on trimethoprim as directed by infectious disease 9/4; patient is seen in conjunction with HBO. Right dorsal foot wound substantially anteriorly is better however she continues to have a deep wound in the distal part of this that is not responding. We have been using silver collagen under border foam ooArea on the right plantar medial heel seems better. We have been using Hydrofera Blue 12/12/18 on evaluation today patient appears to be  doing about the same with regard to her wound based on prior measurements. She does have some necrotic tissue noted on the lateral aspect of the wound that is going require a little bit of sharp debridement today. This includes what appears to be potentially either severely necrotic bone or tendon. Nonetheless other than that she does not appear to have any severe infection which is good news 9/18; it is been 2 weeks since I saw this wound. She is tolerating HBO well. Continued dramatic improvement in the area on the right dorsal foot. She still has a small wound on the heel that we have been using Hydrofera Blue. She continues with a wound VAC 9/24; patient has to be seen emergently today with a swelling on her right lateral lower leg. She says that she told Dr. Evette Doffing about this and also myself on a couple of occasions but I really have no recollection of this. She is not systemically unwell and her wound really looked good the last time I saw this. She showed this to providers at dialysis and she was able to verify that she was started on cephalexin today for 5 doses at dialysis. She dialyzes on Tuesday Thursday and Saturday. 10/2; patient is seen in conjunction with HBO. The area that is draining on the right anterior medial tibia  is more extensive. Copious amounts of serosanguineous drainage with some purulence. We are still using the wound VAC on the original wound then it is stable. Culture I did of the original IandD showed MRSA I contacted dialysis she is now on vancomycin with dialysis treatments. I asked them to run a month 10/9; patient seen in conjunction with HBO. She had a new spontaneous open area just above the wound on the right medial tibia ankle. More swelling on the right medial tibia. Her wound on the foot looks about the same perhaps slightly better. There is no warmth spreading up her leg but no obvious erythema. her MRI of the foot and ankle and distal tib-fib is not booked for next Friday I discussed this with her in great detail over multiple days. it is likely she has spreading infection upper leg at least involving the distal 25% above the ankle. She knows that if I refer her to orthopedics for infectious disease they are going to recommend amputation and indeed I am not against this myself. We had a good trial at trying to heal the foot which is what she wanted along with antibiotics debridement and HBO however she clearly has spreading infection [probably staph aureus/MRSA]. Nevertheless she once again tells me she wants to wait the left of the MRI. She still makes comments about having her amputation done in [place]. 10/19; arrives today with significant swelling on the lateral right leg. Last culture I did showed Klebsiella. Multidrug- resistant. Cipro was intermediate sensitivity and that is what I have her on pending her MRI which apparently is going to be done on Thursday this week although this seems to be moving back and forth. She is not systemically unwell. We are using silver alginate on her major wound area on the right medial foot and the draining areas on the right lateral lower leg 10/26; MRI showed extensive abscess in the anterior compartment of the right leg also widespread  osteomyelitis involving osseous structures of the midfoot and portions of the hindfoot. Also suspicion for osteomyelitis anterior aspect of the distal medial malleolus. Culture I did of the purulence once again showed a multidrug-resistant Klebsiella. I  have been in contact with nephrology late last week and she has been started on cefepime at dialysis to replace the vancomycin We sent a copy of her MRI report to Dr. Geroge Baseman in [place] who is an orthopedic surgeon. The patient takes great stock in his opinion on this. She says she will go to [place] to have her leg amputated if Dr. Geroge Baseman does not feel there is any salvage options. 11/2; she still is not talk to her orthopedic surgeon in [place]. Apparently he will call her at [phone] this afternoon. The quality of this is she has not allowed me to refer her anywhere. She has been told over and over that she needs this amputated but has not agreed to be referred. She tells me her blood sugar was 600 last night but she has not been febrile. 11/9; she never did got a call from the orthopedic surgeon in [place] therefore that is off the radar. We have arranged to get her see orthopedic surgery at [facility]. She still has a lot of draining purulence coming out of the new abscess in her right leg although that probably came from the osteomyelitis in her right foot and heel. Meanwhile the original wound on the right foot looks very healthy. Continued improvement. The issue is that the last MRI showed osteomyelitis in her right foot extensively she now has an abscess in the right anterior lower leg. There is nobody in [place] who will offer this woman anything but an amputation and to be honest that is probably what she needs. I think she still wants to talk about limb salvage although at this point I just do not see that. She has completed her vancomycin at dialysis which was for the original staph aureus she is still on cefepime for the  more recent Klebsiella. She has had a long course of both of these antibiotics which should have benefited the osteomyelitis on the right foot as well as the abscess. 11/16; apparently [place] elective surgery is shut down because of COVID-19 pandemic. I have reached out to some contacts at [facility] to see if we can get her an orthopedic appointment there. I am concerned about continually leaving this but for the moment everything is static. In fact her original large wound on this foot is closing down. It is the abscess on the right anterior leg that continues to drain purulent serosanguineous material. She is not currently on any antibiotics however she had a prolonged course of vancomycin [1 month] as well as cefepime for a month 02/24/2019 on evaluation today patient appears to be doing better than the last time I saw her. This is not a patient that I typically see. With that being said I am covering for Dr. Dellia Nims this week and again compared to when I last saw her overall the wounds in particular seem to be doing significantly better which is good news. With that being said the patient tells me several disconcerting things. She has not been able to get in to see anyone for potential debridement in regard to her leg wounds although she tells me that she does not think it is necessary any longer because she is taking care of that herself. She noticed a string coming out of the lower wound on her leg over the last week. The patient states that she subsequently decided that we must of pack something in there and started pulling the string out and as it kept coming and coming she realized this was likely  her tendon. With that being said she continued to remove as much of this as she could. She then I subsequently proceeded to using tubes of antibiotic ointment which she will stick down into the wound and then scored as much as she can until she sees it coming out of the other wound opening.  She states that in doing this she is actually made things better and there is less redness and irritation. With regard to her foot wound she does have some necrotic tendon and tissue noted in one small corner but again the actual wound itself seems to be doing better with good granulation in general compared to my last evaluation. 12/7; continued improvement in the wound on the substantial part of the right medial foot. Still a necrotic area inferiorly that required debridement but the rest of this looks very healthy and is contracting. She has 2 wounds on the right lateral leg which were her original drainage sites from her abscess but all of this looks a lot better as well. She has been using silver alginate after putting antibiotic biotic ointment in one wound and watching it come out the other. I have talked to her in some detail today. I had given her names of orthopedic surgeons at [facility] for second opinion on what to do about the right leg. I do not think the patient never called them. She has not been able to get a hold of the orthopedic surgeon in [place] that she had put a lot of faith in as being somebody would give her an opinion that she would trust. I talked to her today and said even if I could get her in to another orthopedic surgeon about the leg which she accept an amputation and she said she would not therefore I am not going to press this issue for the moment Objective Constitutional Sitting or standing Blood Pressure is within target range for patient.. Pulse regular and within target range for patient.Marland Kitchen Respirations regular, non-labored and within target range.. Temperature is normal and within the target range for the patient.Marland Kitchen Appears in no distress. Vitals Time Taken: 9:00 AM, Height: 67 in, Weight: 125 lbs, BMI: 19.6, Temperature: 98.7 F, Pulse: 88 bpm, Respiratory Rate: 17 breaths/min, Blood Pressure: 133/67 mmHg, Capillary Blood Glucose: 367  mg/dl. Eyes Conjunctivae clear. No discharge.no icterus. Respiratory work of breathing is normal. Cardiovascular Pedal pulses palpable on the right. General Notes: Wound exam ooThe original surgical wound which was at 1 point purulent with exposed bone is now down to a very nice granulated area. Continues to make nice progress. Inferiorly necrotic debris at the lower part of it I debrided with a #3 curette. I was able to get most of this removed there is some depth in this small area ooShe has the areas which were the original IandD's for the abscess on her right lateral leg. Both of these appear to be healing as well. Integumentary (Hair, Skin) Wound #43 status is Open. Original cause of wound was Gradually Appeared. The wound is located on the Right,Medial Foot. The wound measures 4.7cm length x 3.8cm width x 0.5cm depth; 14.027cm^2 area and 7.014cm^3 volume. There is muscle, tendon, and Fat Layer (Subcutaneous Tissue) Exposed exposed. There is no tunneling or undermining noted. There is a medium amount of serosanguineous drainage noted. The wound margin is well defined and not attached to the wound base. There is large (67-100%) red, pink granulation within the wound bed. There is a small (1-33%) amount of necrotic tissue  within the wound bed including Adherent Slough and Necrosis of Muscle. Wound #44 status is Open. Original cause of wound was Gradually Appeared. The wound is located on the Right Calcaneus. The wound measures 0.5cm length x 0.7cm width x 0.1cm depth; 0.275cm^2 area and 0.027cm^3 volume. There is no tunneling or undermining noted. There is a none present amount of drainage noted. The wound margin is flat and intact. There is no granulation within the wound bed. There is no necrotic tissue within the wound bed. Wound #46 status is Open. Original cause of wound was Other Lesion. The wound is located on the Right,Lateral Lower Leg. The wound measures 0.9cm length x 0.7cm  width x 0.2cm depth; 0.495cm^2 area and 0.099cm^3 volume. There is Fat Layer (Subcutaneous Tissue) Exposed exposed. There is no tunneling or undermining noted. There is a medium amount of serosanguineous drainage noted. The wound margin is well defined and not attached to the wound base. There is medium (34-66%) red, pink granulation within the wound bed. There is a medium (34- 66%) amount of necrotic tissue within the wound bed including Adherent Slough. Wound #47 status is Open. Original cause of wound was Gradually Appeared. The wound is located on the Right,Anterior Lower Leg. The wound measures 1.1cm length x 0.7cm width x 0.4cm depth; 0.605cm^2 area and 0.242cm^3 volume. There is Fat Layer (Subcutaneous Tissue) Exposed exposed. There is no tunneling or undermining noted. There is a medium amount of serosanguineous drainage noted. The wound margin is distinct with the outline attached to the wound base. There is large (67-100%) pink, hyper - granulation within the wound bed. There is a small (1-33%) amount of necrotic tissue within the wound bed including Adherent Slough. 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 There was a Excisional Skin/Subcutaneous Tissue Debridement with a total area of 1 sq cm performed by Ricard Dillon., MD. With the following instrument(s): Curette to remove Viable and Non-Viable tissue/material. Material removed includes Subcutaneous Tissue and Slough and after achieving pain control  using Other (Benzocaine 20%). No specimens were taken. A time out was conducted at 09:36, prior to the start of the procedure. A Minimum amount of bleeding was controlled with Pressure. The procedure was tolerated well with a pain level of 0 throughout and a pain level of 0 following the procedure. Post Debridement Measurements: 4.7cm length x 3.8cm width x 0.5cm depth; 7.014cm^3 volume. Character of Wound/Ulcer Post Debridement is improved. Severity of Tissue Post Debridement is: Fat layer exposed. Post procedure Diagnosis Wound #43: Same as Pre-Procedure Plan Follow-up Appointments: Return Appointment in 1 week. Dressing Change Frequency: Wound #43 Right,Medial Foot: Change dressing three times week. Wound #44 Right Calcaneus: Change dressing three times week. Wound #46 Right,Lateral Lower Leg: Change dressing three times week. Wound #47 Right,Anterior Lower Leg: Change dressing three times week. Wound Cleansing: Clean wound with Wound Cleanser - Anasept to all wounds Primary Wound Dressing: Wound #43 Right,Medial Foot: Calcium Alginate with Silver Wound #44 Right Calcaneus: Calcium Alginate with Silver Wound #46 Right,Lateral Lower Leg: Calcium Alginate with Silver Wound #47 Right,Anterior Lower  Leg: Calcium Alginate with Silver Secondary Dressing: Wound #43 Right,Medial Foot: Kerlix/Rolled [name] ABD pad Wound #44 Right Calcaneus: Kerlix/Rolled [name] Dry [name] Heel Cup Wound #46 Right,Lateral Lower Leg: Kerlix/Rolled [name] ABD pad Wound #47 Right,Anterior Lower Leg: Kerlix/Rolled [name] ABD pad Edema Control: Elevate legs to the level of the heart or above for 30 minutes daily and/or when sitting, a frequency of: - throughout the day Home Health: [facility] for wound care. - Interim 1. I am continuing with silver alginate to all areas 2. The patient will not accept a recommendation for an amputation even though there is extensive  osteomyelitis documented by the recent MRI in the right posterior foot. She also had an abscess anteriorly but at least clinically in the right leg this appears to be better. She has not been on antibiotics for about 3 weeks. I do not think she is happy about that but she did have at least a month of vancomycin and cefepime 3. I had some thoughts about prophylactic antibiotics here. I will try to look at her cultures to see if anything pops out to me is making sense Electronic Signature(s) Signed: 03/10/2019 10:01:17 AM By: Linton Ham MD Entered By: Linton Ham on 03/09/2019 10:24:59 -------------------------------------------------------------------------------- SuperBill Details Patient Name: Date of Service: [name] 03/09/2019 Medical Record Number:[id number] Patient Account Number: [id number] Date of Birth/Sex: Treating RN: January 10, 1971 (48 y.o. 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 E10.621 Type 1 diabetes mellitus with foot ulcer L02.415 Cutaneous abscess of right lower limb L97.812 Non-pressure chronic ulcer of other part of right lower leg with fat layer exposed Facility Procedures CPT4 Code Description: [id number] 11042 - DEB SUBQ TISSUE 20 SQ CM/< ICD-10 Diagnosis Description L97.514 Non-pressure chronic ulcer of other part of right foot with Modifier: necrosis of bon Quantity: 1 e Physician Procedures CPT4 Code Description: [id number] 82641 - WC PHYS SUBQ TISS 20 SQ CM ICD-10 Diagnosis Description L97.514 Non-pressure chronic ulcer of other part of right foot with Modifier: necrosis of bon Quantity: 1 e Electronic Signature(s) Signed: 03/10/2019  10:01:17 AM By: Linton Ham MD Entered By: Linton Ham on 03/09/2019 10:25:20

## 2019-03-10 NOTE — Progress Notes (Signed)
[name], [name] ([id number]) Visit Report for 02/24/2019 Arrival Information Details Patient Name: Date of Service: [name], [name] 02/24/2019 8:45 AM Medical Record Number:[id number] Patient Account Number: [id number] Date of Birth/Sex: Treating RN: [birth year] (48 y.o. [name], [name] Primary Care Jillyan Plitt: [name] Other Clinician: Referring Arun Herrod: Treating Dorothyann Mourer/Extender:Stone III, Lennox Grumbles, [name] in Treatment: 24 Visit Information History Since Last Visit Added or deleted any medications: Yes Patient Arrived: Wheel Chair Any new allergies or adverse reactions: No Arrival Time: 07:53 Had a fall or experienced change in No activities of daily living that may affect Accompanied By: self risk of falls: Transfer Assistance: None Signs or symptoms of abuse/neglect since last No Patient Identification Verified: Yes visito Secondary Verification Process Yes Hospitalized since last visit: No Completed: Implantable device outside of the clinic excluding No Patient Requires Transmission-Based No cellular tissue based products placed in the center Precautions: since last visit: Patient Has Alerts: No Has Dressing in Place as Prescribed: Yes Pain Present Now: No Electronic Signature(s) Signed: 02/24/2019 1:35:50 PM By: Baruch Gouty RN, BSN Entered By: Baruch Gouty on 02/24/2019 07:54:47 -------------------------------------------------------------------------------- Encounter Discharge Information Details Patient Name: Date of Service: [name] 02/24/2019 8:45 AM Medical Record Number:[id number] Patient Account Number: [id number] Date of Birth/Sex: Treating RN: [birth year] (48 y.o. [name] Primary Care Geanna Divirgilio: [name] Other Clinician: Referring Ronson Hagins: Treating Earsie Humm/Extender:Stone III, Lennox Grumbles, [name] in Treatment: 24 Encounter Discharge Information Items Post Procedure Vitals Discharge  Condition: Stable Temperature (F): 98.9 Ambulatory Status: Wheelchair Pulse (bpm): 97 Discharge Destination: Home Respiratory Rate (breaths/min): 18 Transportation: Other Blood Pressure (mmHg): 173/79 Accompanied By: self Schedule Follow-up Appointment: Yes Clinical Summary of Care: Patient Declined Electronic Signature(s) Signed: 02/25/2019 3:17:43 PM By: [name] Entered By: [name] on 02/24/2019 08:39:24 -------------------------------------------------------------------------------- Lower Extremity Assessment Details Patient Name: Date of Service: [name] 02/24/2019 8:45 AM Medical Record Number:[id number] Patient Account Number: [id number] Date of Birth/Sex: Treating RN: 1970/08/21 (48 y.o. [name] Primary Care Heaven Wandell: [name] Other Clinician: Referring Alany Borman: Treating Alyra Patty/Extender:Stone III, Lennox Grumbles, [name] in Treatment: 24 Edema Assessment Assessed: [Left: No] [Right: No] Edema: [Left: Ye] [Right: s] Calf Left: Right: Point of Measurement: cm From Medial Instep cm 29 cm Ankle Left: Right: Point of Measurement: cm From Medial Instep cm 20.5 cm Vascular Assessment Pulses: Dorsalis Pedis Palpable: [Right:Yes] Electronic Signature(s) Signed: 02/24/2019 1:35:50 PM By: Baruch Gouty RN, BSN Entered By: Baruch Gouty on 02/24/2019 07:57:43 -------------------------------------------------------------------------------- Multi-Disciplinary Care Plan Details Patient Name: Date of Service: [name] 02/24/2019 8:45 AM Medical Record Number:[id number] Patient Account Number: [id number] Date of Birth/Sex: Treating RN: October 27, 1970 (48 y.o. [name] Primary Care Stephan Nelis: [name] Other Clinician: Referring Gala Padovano: Treating Manjinder Breau/Extender:Stone III, Lennox Grumbles, [name] in Treatment: 24 Active Inactive Wound/Skin Impairment Nursing Diagnoses: Impaired tissue  integrity Knowledge deficit related to ulceration/compromised skin integrity Goals: Patient/caregiver will verbalize understanding of skin care regimen Date Initiated: 09/03/2018 Target Resolution Date: 02/27/2019 Goal Status: Active Ulcer/skin breakdown will have a volume reduction of 30% by week 4 Date Initiated: 09/03/2018 Date Inactivated: 10/07/2018 Target Resolution Date: 10/01/2018 Unmet Goal Status: Unmet Reason: COMORBITIES Ulcer/skin breakdown will have a volume reduction of 50% by week 8 Date Initiated: 10/07/2018 Date Inactivated: 11/03/2018 Target Resolution Date: 10/31/2018 Unmet Goal Status: Unmet Reason: Osteomyelitis Interventions: Assess patient/caregiver ability to obtain necessary supplies Assess patient/caregiver ability to perform ulcer/skin care regimen upon admission and as needed Assess ulceration(s) every visit Provide education on ulcer and skin care Treatment Activities: Skin  care regimen initiated : 09/03/2018 Topical wound management initiated : 09/03/2018 Notes: Electronic Signature(s) Signed: 03/10/2019 2:55:30 PM By: Carlene Coria RN Entered By: Carlene Coria on 02/24/2019 08:10:27 -------------------------------------------------------------------------------- Pain Assessment Details Patient Name: Date of Service: [name], [name] 02/24/2019 8:45 AM Medical Record Number:[id number] Patient Account Number: [id number] Date of Birth/Sex: Treating RN: [birth year] (48 y.o. [name] Primary Care Neila Teem: [name] Other Clinician: Referring Remberto Lienhard: Treating Jaquasia Doscher/Extender:Stone III, Lennox Grumbles, [name] in Treatment: 24 Active Problems Location of Pain Severity and Description of Pain Patient Has Paino No Site Locations Rate the pain. Current Pain Level: 0 Pain Management and Medication Current Pain Management: Electronic Signature(s) Signed: 02/24/2019 1:35:50 PM By: Baruch Gouty RN, BSN Entered By: Baruch Gouty on  02/24/2019 07:56:52 -------------------------------------------------------------------------------- Patient/Caregiver Education Details [name] 11/24/2020andnbsp8:45 Patient Name: Date of Service: L. AM Medical Record Patient Account Number: [id number] Number: Treating RN: Carlene Coria Date of Birth/Gender: July 23, 1970 (48 y.o. F) Other Clinician: Primary Care Physician:MOREIRA, [name] Treating Worthy Keeler Referring Physician: Physician/Extender: Juanito Doom in Treatment: 61 Education Assessment Education Provided To: Patient Education Topics Provided Wound/Skin Impairment: Methods: Explain/Verbal Responses: State content correctly Electronic Signature(s) Signed: 03/10/2019 2:55:30 PM By: Carlene Coria RN Entered By: Carlene Coria on 02/24/2019 08:10:42 -------------------------------------------------------------------------------- Wound Assessment Details Patient Name: Date of Service: [name] 02/24/2019 8:45 AM Medical Record Number:[id number] Patient Account Number: [id number] Date of Birth/Sex: Treating RN: 01-23-1971 (48 y.o. [name], [name] Primary Care Korinne Greenstein: [name] Other Clinician: Referring Elimelech Houseman: Treating Arnetha Silverthorne/Extender:Stone III, Lennox Grumbles, [name] in Treatment: 24 Wound Status Wound Number: 43 Primary Diabetic Wound/Ulcer of the Lower Extremity Etiology: Wound Location: Right Foot - Medial Wound Open Wounding Event: Gradually Appeared Status: Date Acquired: 08/04/2018 Comorbid Cataracts, Chronic sinus Weeks Of Treatment: 24 History: problems/congestion, Anemia, Sleep Apnea, Clustered Wound: Yes Deep Vein Thrombosis, Hypertension, Peripheral Arterial Disease, Type I Diabetes, Osteoarthritis, Osteomyelitis, Neuropathy, Seizure Disorder Photos Wound Measurements Length: (cm) 5.1 % Reduction in Are Width: (cm) 3.4 % Reduction in Vol Depth: (cm) 0.6 Epithelialization: Clustered  Quantity: 1 Tunneling: Area: (cm) 13.619 Undermining: Volume: (cm) 8.171 Wound Description Classification: Grade 4 Foul Odor After Cl Wound Margin: Well defined, not attached Slough/Fibrino Exudate Amount: Large Exudate Type: Purulent Exudate Color: yellow, brown, green Wound Bed Granulation Amount: Large (67-100%) Granulation Quality: Red, Pink Fascia Exposed: Necrotic Amount: Small (1-33%) Fat Layer [place] Flakes Necrotic Quality: Adherent Slough Tendon Exposed: Muscle Exp Necro Joint Expo Bone Expos eansing: No Yes Exposed Structure No neous Tissue) Exposed: Yes Yes osed: Yes sis of Muscle: Yes sed: No ed: No a: 81.2% ume: 62.5% Small (1-33%) No No Electronic Signature(s) Signed: 03/02/2019 4:19:23 PM By: [name] EMT/HBOT Signed: 03/05/2019 10:53:45 AM By: Baruch Gouty RN, BSN Previous Signature: 02/24/2019 1:35:50 PM Version By: Baruch Gouty RN, BSN Entered By: [name] on 03/02/2019 09:15:48 -------------------------------------------------------------------------------- Wound Assessment Details Patient Name: Date of Service: [name] 02/24/2019 8:45 AM Medical Record Number:[id number] Patient Account Number: [id number] Date of Birth/Sex: Treating RN: 1970-08-25 (48 y.o. [name] Primary Care Jazyiah Yiu: [name] Other Clinician: Referring Ladina Shutters: Treating Yahira Timberman/Extender:Stone III, Lennox Grumbles, [name] in Treatment: 24 Wound Status Wound Number: 44 Primary Diabetic Wound/Ulcer of the Lower Extremity Etiology: Wound Location: Right Calcaneus Wound Open Wounding Event: Gradually Appeared Status: Date Acquired: 08/28/2018 Comorbid Cataracts, Chronic sinus Weeks Of Treatment: 24 History: problems/congestion, Anemia, Sleep Apnea, Clustered Wound: No Deep Vein Thrombosis, Hypertension, Peripheral Arterial Disease, Type I Diabetes, Osteoarthritis, Osteomyelitis, Neuropathy, Seizure  Disorder Photos Wound Measurements  Length: (cm) 0 % Reduction Width: (cm) 0 % Reduction Depth: (cm) 0 Epitheliali Area: (cm) 0 Tunneling: Volume: (cm) 0 Underminin Wound Description Classification: Grade 2 Foul Odor Wound Margin: Flat and Intact Slough/Fib Exudate Amount: None Present Wound Bed Granulation Amount: None Present (0%) Necrotic Amount: None Present (0%) Fascia Exp Fat Layer Tendon Exp Muscle Exp Joint Expo Bone Expos After Cleansing: No rino No Exposed Structure osed: No (Subcutaneous Tissue) Exposed: No osed: No osed: No sed: No ed: No in Area: 100% in Volume: 100% zation: Large (67-100%) No g: No Electronic Signature(s) Signed: 03/02/2019 4:19:23 PM By: [name] EMT/HBOT Signed: 03/05/2019 10:53:45 AM By: Baruch Gouty RN, BSN Previous Signature: 02/24/2019 1:35:50 PM Version By: Baruch Gouty RN, BSN Entered By: [name] on 03/02/2019 09:16:46 -------------------------------------------------------------------------------- Wound Assessment Details Patient Name: Date of Service: [name] 02/24/2019 8:45 AM Medical Record Number:[id number] Patient Account Number: [id number] Date of Birth/Sex: Treating RN: March 29, 1971 (48 y.o. [name] Primary Care Rubina Basinski: [name] Other Clinician: Referring Jaycelynn Knickerbocker: Treating Calvary Difranco/Extender:Stone III, Lennox Grumbles, [name] in Treatment: 24 Wound Status Wound Number: 46 Primary Abscess Etiology: Wound Location: Right Lower Leg - Lateral Wound Open Wounding Event: Other Lesion Status: Date Acquired: 12/25/2018 Notes: MD IandD today. Weeks Of Treatment: 8 Comorbid Cataracts, Chronic sinus Clustered Wound: Yes History: problems/congestion, Anemia, Sleep Apnea, Deep Vein Thrombosis, Hypertension, Peripheral Arterial Disease, Type I Diabetes, Osteoarthritis, Osteomyelitis, Neuropathy, Seizure Disorder Photos Wound Measurements Length: (cm) 1.2 Width:  (cm) 1.3 Depth: (cm) 0.9 Clustered Quantity: 2 Area: (cm) 1.225 Volume: (cm) 1.103 % Reduction in Area: -1450.6% % Reduction in Volume: -1296.2% Epithelialization: None Tunneling: Yes Position (o'clock): 1 Maximum Distance: (cm) 4.2 Undermining: No Wound Description Classification: Full Thickness Without Exposed Support Foul Odo Structures Slough/F Wound Well defined, not attached Margin: Exudate Large Amount: Exudate Purulent Type: Exudate yellow, brown, green Color: Wound Bed Granulation Amount: Medium (34-66%) Granulation Quality: Red, Pink Fascia E Necrotic Amount: Medium (34-66%) Fat Laye Necrotic Quality: Adherent Slough Tendon E Muscle E Joint Ex Bone Exp r After Cleansing: No ibrino Yes Exposed Structure xposed: No r (Subcutaneous Tissue) Exposed: Yes xposed: No xposed: No posed: No osed: No Electronic Signature(s) Signed: 03/02/2019 4:19:23 PM By: [name] EMT/HBOT Signed: 03/05/2019 10:53:45 AM By: Baruch Gouty RN, BSN Previous Signature: 02/24/2019 1:35:50 PM Version By: Baruch Gouty RN, BSN Entered By: [name] on 03/02/2019 09:15:10 -------------------------------------------------------------------------------- Wound Assessment Details Patient Name: Date of Service: [name] 02/24/2019 8:45 AM Medical Record Number:[id number] Patient Account Number: [id number] Date of Birth/Sex: Treating RN: 25-Jul-1970 (48 y.o. [name], [name] Primary Care Jakaila Norment: [name] Other Clinician: Referring Alessa Mazur: Treating Tamea Bai/Extender:Stone III, Lennox Grumbles, [name] in Treatment: 24 Wound Status Wound Number: 47 Primary Abscess Etiology: Wound Location: Right Lower Leg - Anterior Wound Open Wounding Event: Gradually Appeared Status: Date Acquired: 01/31/2019 Comorbid Cataracts, Chronic sinus Weeks Of Treatment: 3 History: problems/congestion, Anemia, Sleep Apnea, Clustered Wound: No Clustered Wound:  No Deep Vein Thrombosis, Hypertension, Peripheral Arterial Disease, Type I Diabetes, Osteoarthritis, Osteomyelitis, Neuropathy, Seizure Disorder Photos Wound Measurements Length: (cm) 1.2 Width: (cm) 1.2 Depth: (cm) 0.9 Area: (cm) 1.131 Volume: (cm) 1.018 % Reduction in Area: -157% % Reduction in Volume: -2213.6% Epithelialization: None Tunneling: Yes Position (o'clock): 7 Maximum Distance: (cm) 4.2 Undermining: Yes Starting Position (o'clock): 12 Ending Position (o'clock): 12 Maximum Distance: (cm) 2.4 Wound Description Classification: Full Thickness Without Exposed Support Foul Od Structures Slough/ Wound Distinct, outline attached Margin: Exudate Large Amount: Exudate Purulent Type: Exudate yellow, brown,  green Color: Wound Bed Granulation Amount: Large (67-100%) Granulation Quality: Pink, Hyper-granulation Fascia E Necrotic Amount: Small (1-33%) Fat Laye Necrotic Quality: Adherent Slough Tendon E Muscle E Joint Ex Bone Exp or After Cleansing: No Fibrino No Exposed Structure xposed: No r (Subcutaneous Tissue) Exposed: Yes xposed: No xposed: No posed: No osed: No Electronic Signature(s) Signed: 03/02/2019 4:19:23 PM By: [name] EMT/HBOT Signed: 03/05/2019 10:53:45 AM By: Baruch Gouty RN, BSN Previous Signature: 02/24/2019 1:35:50 PM Version By: Baruch Gouty RN, BSN Entered By: [name] on 03/02/2019 09:00:53 -------------------------------------------------------------------------------- Vitals Details Patient Name: Date of Service: [name] 02/24/2019 8:45 AM Medical Record Number:[id number] Patient Account Number: [id number] Date of Birth/Sex: Treating RN: 1970-10-13 (48 y.o. [name] Primary Care Naszir Cott: [name] Other Clinician: Referring Mckinnley Smithey: Treating Elliet Goodnow/Extender:Stone III, Lennox Grumbles, [name] in Treatment: 24 Vital Signs Time Taken: 07:54 Temperature (F): 98.9 Height (in):  67 Pulse (bpm): 97 Source: Stated Respiratory Rate (breaths/min): 18 Weight (lbs): 125 Blood Pressure (mmHg): 173/79 Source: Stated Capillary Blood Glucose (mg/dl): 590 Body Mass Index (BMI): 19.6 Reference Range: 80 - 120 mg / dl Notes glucose perpt report this am. pt states took 4 units of novolin. has appointment with endocrinologist today Electronic Signature(s) Signed: 02/24/2019 1:35:50 PM By: Baruch Gouty RN, BSN Entered By: Baruch Gouty on 02/24/2019 07:56:39

## 2019-03-10 NOTE — Progress Notes (Signed)
ROGUE, PAUTLER ([id number]) Visit Report for 12/05/2018 Arrival Information Details Patient Name: Date of Service: [name], [name] 12/05/2018 3:00 PM Medical Record Number:[id number] Patient Account Number: [id number] Date of Birth/Sex: Treating RN: October 19, 1970 (48 y.o. Nancy Fetter Primary Care Ayo Guarino: [name] Other Clinician: Referring Stephana Morell: Treating Tomasina Keasling/Extender:[name], [name], [name] in Treatment: 13 Visit Information History Since Last Visit Added or deleted any medications: No Patient Arrived: Wheel Chair Any new allergies or adverse reactions: No Arrival Time: 16:40 Had a fall or experienced change in No activities of daily living that may affect Accompanied By: self risk of falls: Transfer Assistance: None Signs or symptoms of abuse/neglect since last No Patient Identification Verified: Yes visito Secondary Verification Process Completed: Yes Hospitalized since last visit: No Patient Requires Transmission-Based No Implantable device outside of the clinic excluding No Precautions: cellular tissue based products placed in the center Patient Has Alerts: No since last visit: Has Dressing in Place as Prescribed: Yes Pain Present Now: No Electronic Signature(s) Signed: 03/10/2019 3:04:24 PM By: Sandre Kitty Entered By: Sandre Kitty on 12/05/2018 16:40:53 -------------------------------------------------------------------------------- Complex / Palliative Patient Assessment Details Patient Name: Date of Service: [name], [name] 12/05/2018 3:00 PM Medical Record Number:[id number] Patient Account Number: [id number] Date of Birth/Sex: Treating RN: 1970-08-06 (48 y.o. Nancy Fetter Primary Care Yaasir Menken: [name] Other Clinician: Referring Kaliah Haddaway: Treating Paislea Hatton/Extender:[name], [name], [name] in Treatment: 13 Palliative Management Criteria Complex Wound Management Criteria Patient has  remarkable or complex co-morbidities requiring medications or treatments that extend wound healing times. Examples: Diabetes mellitus with chronic renal failure or end stage renal disease requiring dialysis Advanced or poorly controlled rheumatoid arthritis Diabetes mellitus and end stage chronic obstructive pulmonary disease Active cancer with current chemo- or radiation therapy Type 1 Diabetes, ESRD on Dialysis, Osteomyelitis Care Approach Wound Care Plan: Complex Wound Management Electronic Signature(s) Signed: 12/10/2018 6:55:12 PM By: [name] RN, BSN Signed: 12/16/2018 6:19:19 PM By: Linton Ham MD Entered By: [name] on 12/10/2018 18:44:23 -------------------------------------------------------------------------------- Encounter Discharge Information Details Patient Name: Date of Service: Nancy Gauze. 12/05/2018 3:00 PM Medical Record Number:[id number] Patient Account Number: [id number] Date of Birth/Sex: Treating RN: 12/16/1970 (48 y.o. [name] Primary Care Evalise Abruzzese: [name] Other Clinician: Referring Korina Tretter: Treating Kaely Hollan/Extender:[name], [name], [name] in Treatment: 13 Encounter Discharge Information Items Post Procedure Vitals Discharge Condition: Stable Temperature (F): 97.9 Ambulatory Status: Wheelchair Pulse (bpm): 102 Discharge Destination: Home Respiratory Rate (breaths/min): 18 Transportation: Private Auto Blood Pressure (mmHg): 149/69 Accompanied By: self Schedule Follow-up Appointment: Yes Clinical Summary of Care: Electronic Signature(s) Signed: 12/05/2018 6:15:35 PM By: Deon Pilling Entered By: Deon Pilling on 12/05/2018 18:03:10 -------------------------------------------------------------------------------- Lower Extremity Assessment Details Patient Name: Date of Service: [name], [name] 12/05/2018 3:00 PM Medical Record Number:[id number] Patient Account Number: [id number] Date of Birth/Sex:  Treating RN: 09/10/1970 (48 y.o. Nancy Fetter Primary Care Loriann Bosserman: [name] Other Clinician: Referring Johnhenry Tippin: Treating Shonica Weier/Extender:[name], [name], [name] in Treatment: 13 Edema Assessment Assessed: [Left: No] [Right: No] Edema: [Left: No] [Right: Yes] Calf Left: Right: Point of Measurement: cm From Medial Instep cm 27 cm Ankle Left: Right: Point of Measurement: cm From Medial Instep cm 20.4 cm Electronic Signature(s) Signed: 12/10/2018 6:55:12 PM By: [name] RN, BSN Entered By: [name] on 12/05/2018 16:57:13 -------------------------------------------------------------------------------- Multi Wound Chart Details Patient Name: Date of Service: Nancy Gauze. 12/05/2018 3:00 PM Medical Record Number:[id number] Patient Account Number: [id number] Date of Birth/Sex: Treating RN: [birth year] (48 y.o. Nancy Fetter Primary Care Kelsen Celona: Mellody Drown,  [name] Other Clinician: Referring Codey Burling: Treating Eli Pattillo/Extender:[name], [name], [name] in Treatment: 13 Vital Signs Height(in): 26 Capillary Blood 261 Glucose(mg/dl): Weight(lbs): 125 Pulse(bpm): 102 Body Mass Index(BMI): 20 Blood Pressure(mmHg): 149/69 Temperature(F): 97.9 Respiratory 18 Rate(breaths/min): Photos: [43:No Photos] [44:No Photos] [N/A:N/A] Wound Location: [43:Right Foot - Medial] [44:Right Calcaneus] [N/A:N/A] Wounding Event: [43:Gradually Appeared] [44:Gradually Appeared] [N/A:N/A] Primary Etiology: [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] [44:Lower Extremity] Comorbid History: [43:Cataracts, Chronic sinus Cataracts, Chronic sinus N/A problems/congestion, Anemia, Sleep Apnea, Deep Anemia, Sleep Apnea, Deep Vein Thrombosis, Hypertension, Peripheral Hypertension, Peripheral Arterial Disease, Type I Diabetes,  Osteoarthritis, Osteomyelitis, Neuropathy, Osteomyelitis, Neuropathy, Seizure Disorder]  [44:problems/congestion, Vein Thrombosis, Arterial Disease, Type I Diabetes, Osteoarthritis, Seizure Disorder] Date Acquired: [43:08/04/2018] [44:08/28/2018] [N/A:N/A] Weeks of Treatment: [43:13] [44:13] [N/A:N/A] Wound Status: [43:Open] [44:Open] [N/A:N/A] Measurements L x W x D [43:5.5x4.5x2.9] [44:1.6x1x0.1] [N/A:N/A] (cm) Area (cm) : [43:19.439] [44:1.257] [N/A:N/A] Volume (cm) : [42:70.623] [44:0.126] [N/A:N/A] % Reduction in Area: [43:73.20%] [44:50.00%] [N/A:N/A] % Reduction in Volume: [43:-158.90%] [44:49.80%] [N/A:N/A] Classification: [43:Grade 4] [44:Grade 2] [N/A:N/A] Exudate Amount: [43:Medium] [44:Small] [N/A:N/A] Exudate Type: [43:Serosanguineous] [44:Serosanguineous] [N/A:N/A] Exudate Color: [43:red, brown] [44:red, brown] [N/A:N/A] Wound Margin: [43:Distinct, outline attached] [44:Flat and Intact] [N/A:N/A] Granulation Amount: [43:Large (67-100%)] [44:Medium (34-66%)] [N/A:N/A] Granulation Quality: [43:Red] [44:Pink] [N/A:N/A] Necrotic Amount: [43:Small (1-33%)] [44:Medium (34-66%)] [N/A:N/A] Necrotic Tissue: [43:Eschar, Adherent Slough] [44:Eschar] [N/A:N/A] Exposed Structures: [43:Fat Layer (Subcutaneous Tissue) Exposed: Yes Tendon: Yes Muscle: Yes Bone: Yes Fascia: No Joint: No] [44:Fat Layer (Subcutaneous Tissue) Exposed: Yes Fascia: No Tendon: No Muscle: No Joint: No Bone: No] [N/A:N/A] Epithelialization: [43:Small (1-33%)] [44:Small (1-33%)] [N/A:N/A] Debridement: [43:Debridement - Excisional] [44:Debridement - Excisional] [N/A:N/A] Pre-procedure [43:16:58] [44:16:58] [N/A:N/A] Verification/Time Out Taken: Tissue Debrided: [43:Subcutaneous, Slough] [44:Necrotic/Eschar, Subcutaneous, Slough] [N/A:N/A] Level: [43:Skin/Subcutaneous Tissue Skin/Subcutaneous Tissue] [N/A:N/A] Debridement Area (sq cm):24.75 [44:1.6] [N/A:N/A] Instrument: [43:Curette] [44:Curette] [N/A:N/A] Bleeding: [43:Minimum] [44:Minimum] [N/A:N/A] Hemostasis Achieved: [43:Pressure] [44:Pressure]  [N/A:N/A] Procedural Pain: [43:0] [44:0] [N/A:N/A] Post Procedural Pain: [43:0] [44:0] [N/A:N/A] Debridement Treatment Procedure was tolerated [44:Procedure was tolerated] [N/A:N/A] Response: [43:well] [44:well] Post Debridement [43:5.5x4.5x2.9] [44:1.6x1x0.1] [N/A:N/A] Measurements L x W x D (cm) Post Debridement [76:28.315] [44:0.126] [N/A:N/A] Volume: (cm) Procedures Performed: Debridement [44:Debridement] [N/A:N/A] Treatment Notes Electronic Signature(s) Signed: 12/05/2018 6:28:02 PM By: Linton Ham MD Signed: 12/10/2018 6:55:12 PM By: [name] RN, BSN Entered By: Linton Ham on 12/05/2018 17:33:06 -------------------------------------------------------------------------------- Multi-Disciplinary Care Plan Details Patient Name: Date of Service: Nancy Gauze. 12/05/2018 3:00 PM Medical Record Number:[id number] Patient Account Number: [id number] Date of Birth/Sex: Treating RN: 1970-06-08 (48 y.o. Nancy Fetter Primary Care Jowanda Heeg: [name] Other Clinician: Referring Ardra Kuznicki: Treating Mikell Kazlauskas/Extender:[name], [name], [name] in Treatment: 13 Active Inactive HBO Nursing Diagnoses: Potential for barotraumas to ears, sinuses, teeth, and lungs or cerebral gas embolism related to changes in atmospheric pressure inside hyperbaric oxygen chamber Potential for oxygen toxicity seizures related to delivery of 100% oxygen at an increased atmospheric pressure Potential for pulmonary oxygen toxicity related to delivery of 100% oxygen at an increased atmospheric pressure Goals: Barotrauma will be prevented during HBO2 Date Initiated: 11/28/2018 Target Resolution Date: 12/26/2018 Goal Status: Active Patient will tolerate the hyperbaric oxygen therapy treatment Date Initiated: 11/28/2018 Target Resolution Date: 12/26/2018 Goal Status: Active Interventions: Administer decongestants, per physician orders, prior to HBO2 Administer the correct  therapeutic gas delivery based on the patients needs and limitations, per physician order Assess and provide for patients comfort related to the hyperbaric environment and equalization of middle ear Assess for signs and symptoms related to adverse events, including but not  limited to confinement anxiety, pneumothorax, oxygen toxicity and baurotrauma Assess patient for any history of confinement anxiety Assess patient's knowledge and expectations regarding hyperbaric medicine and provide education related to the hyperbaric environment, goals of treatment and prevention of adverse events Implement protocols to decrease risk of pneumothorax in high risk patients Notes: Wound/Skin Impairment Nursing Diagnoses: Impaired tissue integrity Knowledge deficit related to ulceration/compromised skin integrity Goals: Patient/caregiver will verbalize understanding of skin care regimen Date Initiated: 09/03/2018 Target Resolution Date: 01/02/2019 Goal Status: Active Ulcer/skin breakdown will have a volume reduction of 30% by week 4 Date Initiated: 09/03/2018 Date Inactivated: 10/07/2018 Target Resolution Date: 10/01/2018 Unmet Goal Status: Unmet Reason: COMORBITIES Ulcer/skin breakdown will have a volume reduction of 50% by week 8 Date Initiated: 10/07/2018 Date Inactivated: 11/03/2018 Target Resolution Date: 10/31/2018 Unmet Goal Status: Unmet Reason: Osteomyelitis Interventions: Assess patient/caregiver ability to obtain necessary supplies Assess patient/caregiver ability to perform ulcer/skin care regimen upon admission and as needed Assess ulceration(s) every visit Provide education on ulcer and skin care Treatment Activities: Skin care regimen initiated : 09/03/2018 Topical wound management initiated : 09/03/2018 Notes: Electronic Signature(s) Signed: 12/10/2018 6:55:12 PM By: [name] RN, BSN Entered By: [name] on 12/05/2018  18:43:27 -------------------------------------------------------------------------------- Pain Assessment Details Patient Name: Date of Service: Nancy Gauze 12/05/2018 3:00 PM Medical Record Number:[id number] Patient Account Number: [id number] Date of Birth/Sex: Treating RN: 1970-12-10 (48 y.o. Nancy Fetter Primary Care Dontrae Morini: [name] Other Clinician: Referring Moustafa Mossa: Treating Jett Fukuda/Extender:[name], [name], [name] in Treatment: 13 Active Problems Location of Pain Severity and Description of Pain Patient Has Paino No Site Locations Pain Management and Medication Current Pain Management: Electronic Signature(s) Signed: 12/10/2018 6:55:12 PM By: [name] RN, BSN Signed: 03/10/2019 3:04:24 PM By: Sandre Kitty Entered By: Sandre Kitty on 12/05/2018 16:41:37 -------------------------------------------------------------------------------- Patient/Caregiver Education Details Patient Name: Date of Service: [name], Nancy L. 9/4/2020andnbsp3:00 PM Medical Record Number:[id number] Patient Account Number: [id number] Date of Birth/Gender: Treating RN: Feb 05, 1971 (48 y.o. Nancy Fetter Primary Care Physician: [name] Other Clinician: Referring Physician: Treating Physician/Extender:[name], [name], [name] in Treatment: 13 Education Assessment Education Provided To: Patient Education Topics Provided Wound/Skin Impairment: Methods: Explain/Verbal Responses: State content correctly Electronic Signature(s) Signed: 12/10/2018 6:55:12 PM By: [name] RN, BSN Entered By: [name] on 12/05/2018 18:43:37 -------------------------------------------------------------------------------- Wound Assessment Details Patient Name: Date of Service: Nancy Gauze 12/05/2018 3:00 PM Medical Record Number:[id number] Patient Account Number: [id number] Date of Birth/Sex: Treating RN: 28-Jul-1970 (48 y.o. Nancy Fetter Primary Care Pahola Dimmitt: [name] Other Clinician: Referring Arionna Hoggard: Treating Shelanda Duvall/Extender:[name], [name], [name] in Treatment: 13 Wound Status Wound Number: [id number] Primary Diabetic Wound/Ulcer of the Lower Extremity Etiology: Wound Location: Right Foot - Medial Wound Open Wounding Event: Gradually Appeared Status: Date Acquired: 08/04/2018 Comorbid Cataracts, Chronic sinus problems/congestion, Weeks Of Treatment: 13 History: Anemia, Sleep Apnea, Deep Vein Thrombosis, Clustered Wound: No Hypertension, Peripheral Arterial Disease, Type I Diabetes, Osteoarthritis, Osteomyelitis, Neuropathy, Seizure Disorder Photos Wound Measurements Length: (cm) 5.5 Width: (cm) 4.5 Depth: (cm) 2.9 Area: (cm) 19.439 Volume: (cm) 56.372 Wound Description Classification: Grade 4 Wound Margin: Distinct, outline attached Exudate Amount: Medium Exudate Type: Serosanguineous Exudate Color: red, brown Wound Bed Granulation Amount: Large (67-100%) Granulation Quality: Red Necrotic Amount: Small (1-33%) Necrotic Quality: Eschar, Adherent Slough After Cleansing: No brino Yes Exposed Structure osed: No (Subcutaneous Tissue) Exposed: Yes osed: Yes osed: Yes sis of Muscle: Yes sed: No ed: Yes % Reduction in Area: 73.2% % Reduction in Volume: -158.9% Epithelialization: Small (1-33%) Tunneling: No  Undermining: No Foul Odor Slough/Fi Fascia Exp Fat Layer Tendon Exp Muscle Exp Necro Joint Expo Bone Expos Electronic Signature(s) Signed: 12/10/2018 6:55:12 PM By: [name] RN, BSN Signed: 12/11/2018 10:02:41 AM By: [name] EMT/HBOT Entered By: [name] on 12/10/2018 13:41:07 -------------------------------------------------------------------------------- Wound Assessment Details Patient Name: Date of Service: [name], [name] 12/05/2018 3:00 PM Medical Record Number:[id number] Patient Account Number: [id number] Date of Birth/Sex: Treating  RN: [birth year] (48 y.o. Nancy Fetter Primary Care Danaya Geddis: [name] Other Clinician: Referring Tierre Netto: Treating Yaquelin Langelier/Extender:[name], [name], [name] in Treatment: 13 Wound Status Wound Number: [id number] Primary Diabetic Wound/Ulcer of the Lower Extremity Etiology: Wound Location: Right Calcaneus Wound Open Wounding Event: Gradually Appeared Status: Date Acquired: 08/28/2018 Comorbid Cataracts, Chronic sinus problems/congestion, Weeks Of Treatment: 13 History: Anemia, Sleep Apnea, Deep Vein Thrombosis, Clustered Wound: No Hypertension, Peripheral Arterial Disease, Type I Diabetes, Osteoarthritis, Osteomyelitis, Neuropathy, Seizure Disorder Photos Wound Measurements Length: (cm) 1.6 Width: (cm) 1 Depth: (cm) 0.1 Area: (cm) 1.257 Volume: (cm) 0.126 Wound Description Classification: Grade 2 Wound Margin: Flat and Intact Exudate Amount: Small Exudate Type: Serosanguineous Exudate Color: red, brown Wound Bed Granulation Amount: Medium (34-66%) Granulation Quality: Pink Necrotic Amount: Medium (34-66%) Necrotic Quality: Eschar fter Cleansing: No ino Yes Exposed Structure ed: No ubcutaneous Tissue) Exposed: Yes ed: No ed: No d: No : No % Reduction in Area: 50% % Reduction in Volume: 49.8% Epithelialization: Small (1-33%) Tunneling: No Undermining: No Foul Odor A Slough/Fibr Fascia Expos Fat Layer (S Tendon Expos Muscle Expos Joint Expose Bone Exposed Electronic Signature(s) Signed: 12/10/2018 6:55:12 PM By: [name] RN, BSN Signed: 12/11/2018 10:02:41 AM By: [name] EMT/HBOT Entered By: [name] on 12/10/2018 13:41:34 -------------------------------------------------------------------------------- Vitals Details Patient Name: Date of Service: Nancy Gauze. 12/05/2018 3:00 PM Medical Record Number:[id number] Patient Account Number: [id number] Date of Birth/Sex: Treating RN: 08/19/1970 (48 y.o. Nancy Fetter Primary Care Fartun Paradiso: [name] Other Clinician: Referring Jaydn Fincher: Treating Delphin Funes/Extender:[name], [name], [name] in Treatment: 13 Vital Signs Time Taken: 16:41 Temperature (F): 97.9 Height (in): 67 Pulse (bpm): 102 Weight (lbs): 125 Respiratory Rate (breaths/min): 18 Body Mass Index (BMI): 19.6 Blood Pressure (mmHg): 149/69 Capillary Blood Glucose (mg/dl): 261 Reference Range: 80 - 120 mg / dl Electronic Signature(s) Signed: 03/10/2019 3:04:24 PM By: Sandre Kitty Entered By: Sandre Kitty on 12/05/2018 16:41:28

## 2019-03-10 NOTE — Progress Notes (Signed)
[name], [name] ([id number]) Visit Report for 01/19/2019 Arrival Information Details Patient Name: Date of Service: [name], [name] 01/19/2019 1:15 PM Medical Record Number:[id number] Patient Account Number: [id number] Date of Birth/Sex: Treating RN: 1970/05/31 (48 y.o. [name] Primary Care [name]: [name] Other Clinician: Referring [name]: Treating [name]/Extender:[name], [name], [name] in Treatment: 19 Visit Information History Since Last Visit Added or deleted any medications: No Patient Arrived: Wheel Chair Any new allergies or adverse reactions: No Arrival Time: 13:42 Had a fall or experienced change in No activities of daily living that may affect Accompanied By: self risk of falls: Transfer Assistance: Manual Signs or symptoms of abuse/neglect since last No Patient Identification Verified: Yes visito Secondary Verification Process Completed: Yes Hospitalized since last visit: No Patient Requires Transmission-Based No Implantable device outside of the clinic excluding No Precautions: cellular tissue based products placed in the center Patient Has Alerts: No since last visit: Has Dressing in Place as Prescribed: Yes Pain Present Now: No Electronic Signature(s) Signed: 03/10/2019 3:02:15 PM By: [name] Entered By: [name] on 01/19/2019 13:48:53 -------------------------------------------------------------------------------- Clinic Level of Care Assessment Details Patient Name: Date of Service: Nancy, [name] 01/19/2019 1:15 PM Medical Record Number:[id number] Patient Account Number: [id number] Date of Birth/Sex: Treating RN: 30-Aug-1970 (48 y.o. [name] Primary Care Latwan Luchsinger: [name] Other Clinician: Referring [name]: Treating [name]/Extender:[name], [name], [name] in Treatment: 19 Clinic Level of Care Assessment Items TOOL 4 Quantity Score X - Use when only an  EandM is performed on FOLLOW-UP visit 1 0 ASSESSMENTS - Nursing Assessment / Reassessment X - Reassessment of Co-morbidities (includes updates in patient status) 1 10 X - Reassessment of Adherence to Treatment Plan 1 5 ASSESSMENTS - Wound and Skin Assessment / Reassessment []  - Simple Wound Assessment / Reassessment - one wound 0 X - Complex Wound Assessment / Reassessment - multiple wounds 3 5 []  - Dermatologic / Skin Assessment (not related to wound area) 0 ASSESSMENTS - Focused Assessment []  - Circumferential Edema Measurements - multi extremities 0 []  - Nutritional Assessment / Counseling / Intervention 0 X - Lower Extremity Assessment (monofilament, tuning fork, pulses) 1 5 []  - Peripheral Arterial Disease Assessment (using hand held doppler) 0 ASSESSMENTS - Ostomy and/or Continence Assessment and Care []  - Incontinence Assessment and Management 0 []  - Ostomy Care Assessment and Management (repouching, etc.) 0 PROCESS - Coordination of Care X - Simple Patient / Family Education for ongoing care 1 15 []  - Complex (extensive) Patient / Family Education for ongoing care 0 X - Staff obtains Programmer, systems, Records, Test Results / Process Orders 1 10 X - Staff telephones HHA, Nursing Homes / Clarify orders / etc 1 10 []  - Routine Transfer to another Facility (non-emergent condition) 0 []  - Routine Hospital Admission (non-emergent condition) 0 []  - New Admissions / Biomedical engineer / Ordering NPWT, Apligraf, etc. 0 []  - Emergency Hospital Admission (emergent condition) 0 X - Simple Discharge Coordination 1 10 []  - Complex (extensive) Discharge Coordination 0 PROCESS - Special Needs []  - Pediatric / Minor Patient Management 0 []  - Isolation Patient Management 0 []  - Hearing / Language / Visual special needs 0 []  - Assessment of Community assistance (transportation, D/C planning, etc.) 0 []  - Additional assistance / Altered mentation 0 []  - Support Surface(s) Assessment (bed, cushion,  seat, etc.) 0 INTERVENTIONS - Wound Cleansing / Measurement []  - Simple Wound Cleansing - one wound 0 X - Complex Wound Cleansing - multiple wounds 3 5 X - Wound  Imaging (photographs - any number of wounds) 1 5 []  - Wound Tracing (instead of photographs) 0 []  - Simple Wound Measurement - one wound 0 X - Complex Wound Measurement - multiple wounds 3 5 INTERVENTIONS - Wound Dressings []  - Small Wound Dressing one or multiple wounds 0 X - Medium Wound Dressing one or multiple wounds 1 15 []  - Large Wound Dressing one or multiple wounds 0 X - Application of Medications - topical 1 5 []  - Application of Medications - injection 0 INTERVENTIONS - Miscellaneous []  - External ear exam 0 []  - Specimen Collection (cultures, biopsies, blood, body fluids, etc.) 0 []  - Specimen(s) / Culture(s) sent or taken to Lab for analysis 0 []  - Patient Transfer (multiple staff / Civil Service fast streamer / Similar devices) 0 []  - Simple Staple / Suture removal (25 or less) 0 []  - Complex Staple / Suture removal (26 or more) 0 []  - Hypo / Hyperglycemic Management (close monitor of Blood Glucose) 0 []  - Ankle / Brachial Index (ABI) - do not check if billed separately 0 X - Vital Signs 1 5 Has the patient been seen at the hospital within the last three years: Yes Total Score: 140 Level Of Care: New/Established - Level 4 Electronic Signature(s) Signed: 01/21/2019 6:50:57 PM By: Nancy Hurst RN, BSN Entered By: [name] on 01/19/2019 18:46:35 -------------------------------------------------------------------------------- Encounter Discharge Information Details Patient Name: Date of Service: [name] 01/19/2019 1:15 PM Medical Record Number:[id number] Patient Account Number: [id number] Date of Birth/Sex: Treating RN: 01-10-1971 (48 y.o. [name] Primary Care [name]: [name] Other Clinician: Referring [name]: Treating [name]/Extender:[name], [name], [name] in  Treatment: 17 Encounter Discharge Information Items Discharge Condition: Stable Ambulatory Status: Wheelchair Discharge Destination: Home Transportation: Other Accompanied By: self Schedule Follow-up Appointment: Yes Clinical Summary of Care: Patient Declined Electronic Signature(s) Signed: 01/20/2019 6:16:57 PM By: [name] Entered By: [name] on 01/19/2019 14:52:52 -------------------------------------------------------------------------------- Lower Extremity Assessment Details Patient Name: Date of Service: [name], [name] 01/19/2019 1:15 PM Medical Record Number:[id number] Patient Account Number: [id number] Date of Birth/Sex: Treating RN: 02-18-1971 (48 y.o. [name] Primary Care Elanor Cale: [name] Other Clinician: Referring Mico Spark: Treating Drevon Plog/Extender:[name], [name], [name] in Treatment: 19 Edema Assessment Assessed: [Left: No] [Right: No] Edema: [Left: Ye] [Right: s] Calf Left: Right: Point of Measurement: cm From Medial Instep cm 26 cm Ankle Left: Right: Point of Measurement: cm From Medial Instep cm 21 cm Vascular Assessment Pulses: Dorsalis Pedis Palpable: [Right:Yes] Electronic Signature(s) Signed: 01/21/2019 6:50:57 PM By: Nancy Hurst RN, BSN Entered By: [name] on 01/19/2019 14:30:26 -------------------------------------------------------------------------------- Multi Wound Chart Details Patient Name: Date of Service: [name] 01/19/2019 1:15 PM Medical Record Number:[id number] Patient Account Number: [id number] Date of Birth/Sex: Treating RN: Oct 26, 1970 (48 y.o. [name] Primary Care Eleftheria Taborn: [name] Other Clinician: Referring Dodge Ator: Treating Kamoni Gentles/Extender:[name], [name], [name] in Treatment: 19 Vital Signs Height(in): 64 Capillary Blood 350 Glucose(mg/dl): Weight(lbs): 125 Pulse(bpm): 86 Body Mass Index(BMI): 20 Blood  Pressure(mmHg): 139/73 Temperature(F): 98.7 Respiratory 16 Rate(breaths/min): Photos: [43:No Photos] [44:No Photos] [46:No Photos] Wound Location: [43:Right Foot - Medial] [44:Right Calcaneus] [46:Right Lower Leg - Lateral] Wounding Event: [43:Gradually Appeared] [44:Gradually Appeared] [46:Other Lesion] 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] [44:Lower Extremity] Comorbid History: [43:Cataracts, Chronic sinus Cataracts, Chronic sinus Cataracts, Chronic sinus problems/congestion, Anemia, Sleep Apnea, Deep Anemia, Sleep Apnea, Deep Anemia, Sleep Apnea, Deep Vein Thrombosis, Hypertension, Peripheral Hypertension,  Peripheral Hypertension, Peripheral Arterial Disease, Type I  Diabetes, Osteoarthritis, Osteomyelitis, Neuropathy, Osteomyelitis, Neuropathy, Osteomyelitis, Neuropathy, Seizure Disorder] [44:problems/congestion, Vein Thrombosis, Arterial Disease, Type I  Diabetes, Osteoarthritis, Seizure Disorder] [46:problems/congestion, Vein Thrombosis, Arterial Disease, Type I Diabetes, Osteoarthritis, Seizure Disorder] Date Acquired: [43:08/04/2018] [44:08/28/2018] [46:12/25/2018] [name] of Treatment: [43:19] [44:19] [46:3] Wound Status: [43:Open] [44:Open] [46:Open] Measurements L x W x D 7.2x3.5x2.4 [44:0.6x1.2x0.1] [46:0.5x1.2x0.2] (cm) Area (cm) : [43:19.792] [44:0.565] [46:0.471] Volume (cm) : [43:47.501] [44:0.057] [46:0.094] % Reduction in Area: [43:72.70%] [44:77.50%] [46:-496.20%] % Reduction in Volume: -118.20% [44:77.30%] [46:-19.00%] Classification: [43:Grade 4] [44:Grade 2] [46:Full Thickness Without Exposed Support Structures] Exudate Amount: [43:Medium] [44:Small] [46:Medium] Exudate Type: [43:Serosanguineous] [44:Serosanguineous] [46:Serosanguineous] Exudate Color: [43:red, brown] [44:red, brown] [46:red, brown] Wound Margin: [43:Distinct, outline attached Flat and Intact] [46:Distinct, outline attached] Granulation  Amount: [43:Large (67-100%)] [44:Large (67-100%)] [46:Large (67-100%)] Granulation Quality: [43:Red, Pink] [44:Red] [46:Red, Pink] Necrotic Amount: [43:Small (1-33%)] [44:None Present (0%)] [46:Small (1-33%)] Exposed Structures: [43:Fat Layer (Subcutaneous Tissue) Exposed: Yes Tendon: Yes Muscle: Yes Fascia: No Joint: No Bone: No Small (1-33%)] [44:Fat Layer (Subcutaneous Tissue) Exposed: Yes Fascia: No Tendon: No Muscle: No Joint: No Bone: No Medium (34-66%)] [46:Fat Layer  (Subcutaneous Tissue) Exposed: Yes Fascia: No Tendon: No Muscle: No Joint: No Bone: No Medium (34-66%)] Treatment Notes Wound #43 (Right, Medial Foot) 1. Cleanse With Wound Cleanser 3. Primary Dressing Applied Calcium Alginate Ag 4. Secondary Dressing Dry [name] Roll [name] Notes patient does not want heel cup or ABD pad Wound #44 (Right Calcaneus) 1. Cleanse With Wound Cleanser 3. Primary Dressing Applied Calcium Alginate Ag 4. Secondary Dressing Dry [name] Roll [name] Notes patient does not want heel cup or ABD pad Wound #46 (Right, Lateral Lower Leg) 1. Cleanse With Wound Cleanser 3. Primary Dressing Applied Calcium Alginate Ag 4. Secondary Dressing Dry [name] Roll [name] Notes patient does not want heel cup or ABD pad Electronic Signature(s) Signed: 01/19/2019 5:54:03 PM By: Linton Ham MD Signed: 01/21/2019 6:50:57 PM By: Nancy Hurst RN, BSN Entered By: Linton Ham on 01/19/2019 15:45:28 -------------------------------------------------------------------------------- Multi-Disciplinary Care Plan Details Patient Name: Date of Service: [name] 01/19/2019 1:15 PM Medical Record Number:[id number] Patient Account Number: [id number] Date of Birth/Sex: Treating RN: [birth year] (48 y.o. [name] Primary Care Tiera Mensinger: [name] Other Clinician: Referring Federico Maiorino: Treating Sonnie Pawloski/Extender:[name], [name], [name] in Treatment: 2 Active  Inactive HBO Nursing Diagnoses: Potential for barotraumas to ears, sinuses, teeth, and lungs or cerebral gas embolism related to changes in atmospheric pressure inside hyperbaric oxygen chamber Potential for oxygen toxicity seizures related to delivery of 100% oxygen at an increased atmospheric pressure Potential for pulmonary oxygen toxicity related to delivery of 100% oxygen at an increased atmospheric pressure Goals: Barotrauma will be prevented during HBO2 Date Initiated: 11/28/2018 Target Resolution Date: 01/30/2019 Goal Status: Active Patient will tolerate the hyperbaric oxygen therapy treatment Date Initiated: 11/28/2018 Target Resolution Date: 01/30/2019 Goal Status: Active Interventions: Administer decongestants, per physician orders, prior to HBO2 Administer the correct therapeutic gas delivery based on the patients needs and limitations, per physician order Assess and provide for patients comfort related to the hyperbaric environment and equalization of middle ear Assess for signs and symptoms related to adverse events, including but not limited to confinement anxiety, pneumothorax, oxygen toxicity and baurotrauma Assess patient for any history of confinement anxiety Assess patient's knowledge and expectations regarding hyperbaric medicine and provide education related to the hyperbaric environment, goals of treatment and prevention of adverse events Implement protocols to decrease risk of pneumothorax in high risk patients Notes: Wound/Skin Impairment Nursing Diagnoses: Impaired tissue integrity Knowledge deficit  related to ulceration/compromised skin integrity Goals: Patient/caregiver will verbalize understanding of skin care regimen Date Initiated: 09/03/2018 Target Resolution Date: 02/27/2019 Goal Status: Active Ulcer/skin breakdown will have a volume reduction of 30% by week 4 Date Initiated: 09/03/2018 Date Inactivated: 10/07/2018 Target Resolution Date:  10/01/2018 Unmet Goal Status: Unmet Goal Status: Unmet Reason: COMORBITIES Ulcer/skin breakdown will have a volume reduction of 50% by week 8 Date Initiated: 10/07/2018 Date Inactivated: 11/03/2018 Target Resolution Date: 10/31/2018 Unmet Goal Status: Unmet Reason: Osteomyelitis Interventions: Assess patient/caregiver ability to obtain necessary supplies Assess patient/caregiver ability to perform ulcer/skin care regimen upon admission and as needed Assess ulceration(s) every visit Provide education on ulcer and skin care Treatment Activities: Skin care regimen initiated : 09/03/2018 Topical wound management initiated : 09/03/2018 Notes: Electronic Signature(s) Signed: 01/21/2019 6:50:57 PM By: Nancy Hurst RN, BSN Entered By: [name] on 01/19/2019 18:44:51 -------------------------------------------------------------------------------- Pain Assessment Details Patient Name: Date of Service: [name] 01/19/2019 1:15 PM Medical Record Number:[id number] Patient Account Number: [id number] Date of Birth/Sex: Treating RN: [birth year] (48 y.o. [name] Primary Care Tonantzin Mimnaugh: [name] Other Clinician: Referring Daleiza Bacchi: Treating Surya Folden/Extender:[name], [name], [name] in Treatment: 19 Active Problems Location of Pain Severity and Description of Pain Patient Has Paino No Site Locations Pain Management and Medication Current Pain Management: Electronic Signature(s) Signed: 01/21/2019 6:50:57 PM By: Nancy Hurst RN, BSN Signed: 03/10/2019 3:02:15 PM By: [name] Entered By: [name] on 01/19/2019 13:49:30 -------------------------------------------------------------------------------- Patient/Caregiver Education Details Patient Name: Date of Service: [name] 10/19/2020andnbsp1:15 PM Medical Record Patient Account Number: [id number] Number: Treating RN: [name] Date of Birth/Gender:  Feb 05, 1971 (48 y.o. F) Other Clinician: Primary Care Physician: Katharine Look Referring Physician: Physician/Extender: Juanito Doom in Treatment: 55 Education Assessment Education Provided To: Patient Education Topics Provided Wound/Skin Impairment: Methods: Explain/Verbal Responses: State content correctly Electronic Signature(s) Signed: 01/21/2019 6:50:57 PM By: Nancy Hurst RN, BSN Entered By: [name] on 01/19/2019 18:45:06 -------------------------------------------------------------------------------- Wound Assessment Details Patient Name: Date of Service: [name] 01/19/2019 1:15 PM Medical Record Number:[id number] Patient Account Number: [id number] Date of Birth/Sex: Treating RN: 03-15-1971 (48 y.o. [name] Primary Care Lilliane Sposito: [name] Other Clinician: Referring Eathan Groman: Treating Debria Broecker/Extender:[name], [name], [name] in Treatment: 19 Wound Status Wound Number: [id number] Primary Diabetic Wound/Ulcer of the Lower Extremity Etiology: Wound Location: Right Foot - Medial Wound Open Wounding Event: Gradually Appeared Status: Date Acquired: 08/04/2018 Date Acquired: 08/04/2018 Comorbid Cataracts, Chronic sinus problems/congestion, [name] Of Treatment: 19 History: Anemia, Sleep Apnea, Deep Vein Thrombosis, Clustered Wound: No Hypertension, Peripheral Arterial Disease, Type I Diabetes, Osteoarthritis, Osteomyelitis, Neuropathy, Seizure Disorder Photos Wound Measurements Length: (cm) 7.2 Width: (cm) 3.5 Depth: (cm) 2.4 Area: (cm) 19.792 Volume: (cm) 47.501 Wound Description Classification: Grade 4 Wound Margin: Distinct, outline attached Exudate Amount: Medium Exudate Type: Serosanguineous Exudate Color: red, brown Wound Bed Granulation Amount: Large (67-100%) Granulation Quality: Red, Pink Necrotic Amount: Small (1-33%) Necrotic Quality: Adherent Slough r After Cleansing:  No ibrino Yes Exposed Structure posed: No (Subcutaneous Tissue) Exposed: Yes posed: Yes posed: Yes osis of Muscle: Yes osed: No sed: No % Reduction in Area: 72.7% % Reduction in Volume: -118.2% Epithelialization: Small (1-33%) Tunneling: No Undermining: No Foul Odo Slough/F Fascia Ex Fat Layer Tendon Ex Muscle Ex Necr Joint Exp Bone Expo Electronic Signature(s) Signed: 01/21/2019 6:50:57 PM By: Nancy Hurst RN, BSN Signed: 01/22/2019 4:25:07 PM By: [name] EMT/HBOT Entered By: [name] on 01/21/2019 08:53:49 -------------------------------------------------------------------------------- Wound Assessment Details Patient Name: Date of  Service: [name] 01/19/2019 1:15 PM Medical Record Number:[id number] Patient Account Number: [id number] Date of Birth/Sex: Treating RN: [birth year] (49 y.o. [name] Primary Care Loreal Schuessler: [name] Other Clinician: Referring Josephine Rudnick: Treating Norah Devin/Extender:[name], [name], [name] in Treatment: 19 Wound Status Wound Number: 44 Primary Diabetic Wound/Ulcer of the Lower Extremity Etiology: Wound Location: Right Calcaneus Wound Open Wounding Event: Gradually Appeared Status: Date Acquired: 08/28/2018 Comorbid Cataracts, Chronic sinus problems/congestion, [name] Of Treatment: 19 History: Anemia, Sleep Apnea, Deep Vein Thrombosis, Clustered Wound: No Hypertension, Peripheral Arterial Disease, Type I Diabetes, Osteoarthritis, Osteomyelitis, Neuropathy, Seizure Disorder Photos Wound Measurements Length: (cm) 0.6 Width: (cm) 1.2 Depth: (cm) 0.1 Area: (cm) 0.565 Volume: (cm) 0.057 Wound Description Classification: Grade 2 Wound Margin: Flat and Intact Exudate Amount: Small Exudate Type: Serosanguineous Exudate Color: red, brown Wound Bed Granulation Amount: Large (67-100%) Granulation Quality: Red Necrotic Amount: None Present (0%) fter Cleansing: No ino Yes Exposed  Structure ed: No ubcutaneous Tissue) Exposed: Yes ed: No ed: No d: No : No % Reduction in Area: 77.5% % Reduction in Volume: 77.3% Epithelialization: Medium (34-66%) Tunneling: No Undermining: No Foul Odor A Slough/Fibr Fascia Expos Fat Layer (S Tendon Expos Muscle Expos Joint Expose Bone Exposed Electronic Signature(s) Signed: 01/21/2019 6:50:57 PM By: Nancy Hurst RN, BSN Signed: 01/22/2019 4:25:07 PM By: [name] EMT/HBOT Entered By: [name] on 01/21/2019 08:54:11 -------------------------------------------------------------------------------- Wound Assessment Details Patient Name: Date of Service: [name] 01/19/2019 1:15 PM Medical Record Number:[id number] Patient Account Number: [id number] Date of Birth/Sex: Treating RN: 21-Apr-1970 (48 y.o. [name] Primary Care Jonise [name]: [name] Other Clinician: Referring Ariatna Jester: Treating August Gosser/Extender:[name], [name], [name] in Treatment: 19 Wound Status Wound Number: [id number] Primary Abscess Etiology: Wound Location: Right Lower Leg - Lateral Wound Open Wounding Event: Other Lesion Status: Date Acquired: 12/25/2018 Notes: MD IandD today. [name] Of Treatment: 3 Comorbid Cataracts, Chronic sinus problems/congestion, Clustered Wound: No History: Anemia, Sleep Apnea, Deep Vein Thrombosis, Hypertension, Peripheral Arterial Disease, Type I Diabetes, Osteoarthritis, Osteomyelitis, Neuropathy, Seizure Disorder Photos Wound Measurements Length: (cm) 0.5 % Reduct Width: (cm) 1.2 % Reduct Depth: (cm) 0.2 Epitheli Area: (cm) 0.471 Tunneli Volume: (cm) 0.094 Undermi Wound Description Classification: Full Thickness Without Exposed Support Foul Od Structures Slough/ Wound Distinct, outline attached Margin: Exudate Medium Amount: Exudate Serosanguineous Type: Exudate red, brown Color: Wound Bed Granulation Amount: Large (67-100%) Granulation Quality: Red, Pink  Fascia E Necrotic Amount: Small (1-33%) Fat Laye Tendon Expo Muscle Expo Joint Expos Bone Expose or After Cleansing: No Fibrino Yes Exposed Structure xposed: No r (Subcutaneous Tissue) Exposed: Yes sed: No sed: No ed: No d: No ion in Area: -496.2% ion in Volume: -19% alization: Medium (34-66%) ng: No ning: No Electronic Signature(s) Signed: 01/21/2019 6:50:57 PM By: Nancy Hurst RN, BSN Signed: 01/22/2019 4:25:07 PM By: [name] EMT/HBOT Entered By: [name] on 01/21/2019 08:46:39 -------------------------------------------------------------------------------- [place] Details Patient Name: Date of Service: [name] 01/19/2019 1:15 PM Medical Record Number:[id number] Patient Account Number: [id number] Date of Birth/Sex: Treating RN: 20-Sep-1970 (49 y.o. [name] Primary Care Luman Holway: [name] Other Clinician: Referring Cordaro Mukai: Treating Krista Godsil/Extender:[name], [name], [name] in Treatment: 19 Vital Signs Time Taken: 13:48 Temperature (F): 98.7 Height (in): 67 Pulse (bpm): 93 Weight (lbs): 125 Respiratory Rate (breaths/min): 16 Body Mass Index (BMI): 19.6 Blood Pressure (mmHg): 139/73 Capillary Blood Glucose (mg/dl): 350 Reference Range: 80 - 120 mg / dl Electronic Signature(s) Signed: 03/10/2019 3:02:15 PM By: [name] Entered By: [name] on 01/19/2019 13:49:17

## 2019-03-10 NOTE — Progress Notes (Signed)
WYNELL, HALBERG ([id number]) Visit Report for 02/24/2019 Chief Complaint Document Details Patient Name: Date of Service: TANISHI, NAULT 02/24/2019 8:45 AM Medical Record Number:[id number] Patient Account Number: [id number] Date of Birth/Sex: Treating RN: [birth year] (48 y.o. F) Primary Care Provider: [name] Other Clinician: Referring Provider: Treating Provider/Extender:Stone III, Lennox Grumbles, [name] in Treatment: 24 Information Obtained from: Patient Chief Complaint Significant right foot ulcer with right leg ulcers Electronic Signature(s) Signed: 02/24/2019 10:29:27 PM By: Worthy Keeler PA-C Entered By: Worthy Keeler on 02/24/2019 08:10:56 -------------------------------------------------------------------------------- Debridement Details Patient Name: [name] Date of Service: 02/24/2019 8:45 AM Medical Record Number:[id number] Patient Account Number: [id number] Date of Birth/Sex: Treating RN: 1970-12-21 (48 y.o. [name]: [name] Other Clinician: Referring Provider: Treating Provider/Extender:Stone III, Lennox Grumbles, [name] in Treatment: 24 Debridement Performed for Wound #43 Right,Medial Foot Assessment: Performed By: Physician Worthy Keeler, PA Debridement Type: Debridement Severity of Tissue Pre Fat layer exposed Debridement: Level of Consciousness (Pre- Awake and Alert procedure): Pre-procedure Verification/Time Out Taken: Yes - 08:21 Start Time: 08:21 Pain Control: Lidocaine 5% topical ointment Total Area Debrided (L x W): 1 (cm) x 1 (cm) = 1 (cm) Tissue and other material Non-Viable, Slough, Subcutaneous, Tendon, Slough debrided: Level: Skin/Subcutaneous Tissue/Muscle Debridement Description: Excisional Instrument: Forceps, Scissors Bleeding: Moderate Hemostasis Achieved: Pressure End Time: 08:25 Procedural Pain: 0 Post Procedural Pain: 0 Response to Treatment: Procedure  was tolerated well Level of Consciousness Awake and Alert (Post-procedure): Post Debridement Measurements of Total Wound Length: (cm) 5.1 Width: (cm) 3.4 Depth: (cm) 0.6 Volume: (cm) 8.171 Character of Wound/Ulcer Post Improved Debridement: Severity of Tissue Post Debridement: Fat layer exposed Post Procedure Diagnosis Same as Pre-procedure Electronic Signature(s) Signed: 02/24/2019 10:29:27 PM By: Worthy Keeler PA-C Signed: 03/10/2019 2:55:30 PM By: Carlene Coria RN Entered By: Carlene Coria on 02/24/2019 08:24:25 -------------------------------------------------------------------------------- HPI Details Patient Name: Date of Service: [name] 02/24/2019 8:45 AM Medical Record Number:[id number] Patient Account Number: [id number] Date of Birth/Sex: Treating RN: 01-Feb-1971 (48 y.o. F) Primary Care Provider: [name] Other Clinician: Referring Provider: Treating Provider/Extender:Stone III, Lennox Grumbles, [name] in Treatment: 24 History of Present Illness HPI Description: 48 year old diabetic who is known to have type 1 diabetes which is poorly controlled last hemoglobin A1c was 11%. She comes in with a ulcerated area on the left lateral foot which has been there for over 6 months. Was recently she has been treated by [name] of podiatry who saw her last on 05/28/2016. Review of his notes revealed that the patient had incision and drainage with placement of antibiotic beads to the left foot on 04/11/2016 for possible osteomyelitis of the cuboid bone. Over the last year she's had a history of amputation of the left fifth toe and a femoropopliteal popliteal bypass graft somewhere in April 2017. 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 His note [name] mentions that the patient has been referred to me for further wound care and possibly great candidate for hyperbaric oxygen therapy due to recurrent osteomyelitis. However we do not have any  x-rays of biopsy reports confirming this. He has been on several antibiotics including Bactrim and most recently is on doxycycline for an MRSA. I understand, the patient was not a candidate for IV antibiotics as she has had previous PICC lines which resulted in blood clots in both arms. There was a x-ray report dated 04/04/2016 on [name] notes which showed evidence of fifth ray resection left foot with  osteolytic changes noted to the fourth metatarsal and cuboid bone on the left. 06/13/2016 -- had a left foot x-ray which showed no acute fracture or dislocation and no definite radiographic evidence of osteomyelitis. Advanced osteopenia was seen. 06/20/2016 -- she has noticed a new wound on the right plantar foot in the region where she had a callus before. 06/27/16- the patient did have her x-ray of the right foot which showed no findings to suggest osteomyelitis. She saw her endocrinologist, [name], yesterday. Her A1c in January was 11. He also indicates mismanagement and noncompliance regarding her diabetes. She is currently on Bactrim for a lip infection. She is complaining of nausea, vomiting and diarrhea. She is unable to articulate the exact orders or dosing of the Bactrim; it is unclear when she will complete this. 07/04/2016 -- results from [facility] of ABIs with ankle waveforms were noted from 02/14/2016. The examination done on 06/27/2015 showed noncompressible ABIs with the right being 1.45 and the left being 1.33. The present examination showed a right ABI of 1.19 on the left of 1.33. The conclusion was that right normal ABI in the lower extremity at rest however compared to previous study which was noncompressible ABI may be falsely elevated side suggesting medial calcification. The left ABI suggested medial calcification. 08/01/2016 -- the patient had more redness and pain on her right foot and did not get to come to see as noted she see her PCP or go to the ER and decided to  take some leftover metronidazole which she had at home. As usual, the patient does report she feels and is rather noncompliant. 08/08/2016 -- -- x-ray of the right foot -- FINDINGS:Transmetatarsal amputation is noted. No bony destruction is noted to suggest osteomyelitis. IMPRESSION: No evidence of osteomyelitis. Postsurgical changes are seen. MRI would be more sensitive for possible bony changes. Culture has grown Serratia Marcescens -- sensitive to Bactrim, ciprofloxacin, ceftazidime she was seen by [name] on 08/06/2016. He did not find any exposed bone, muscle, tendon, ligament or joint. There was no malodor and he did a excisional debridement in the office. ============ Old notes: 48 year old patient who is known to the wound clinic for a while had been away from the wound clinic since 09/01/2014. Over the last several months she has been admitted to various hospitals including [facility]. She was treated for a right metatarsal osteomyelitis with a transmetatarsal amputation and this was done about 2 months ago. He has a small ulcerated area on the right heel and she continues to have an ulcerated area on the left plantar aspect of the foot. The patient was recently admitted to the [facility] hospital group between 7/12 and 10/18/2014. she was given 3 weeks of IV vancomycin and was to follow-up with her surgeons at [facility] and also took oral vancomycin for C. difficile colitis. 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 MRI of the left foot done on 10/14/2014 showed no abscess or osteomyelitis. 04/27/15; this is a patient we know from previous stays in the wound care center. She is a  type I diabetic I am not sure of her control currently. Since the last time I saw her she is had a right transmetatarsal amputation and has no wounds on her right foot and  has no open wounds. She is been followed at the wound care center at [facility] in [place]. She comes today with the desire to undergo hyperbaric treatment locally. Apparently one of her wound care providers in [place] has suggested hyperbarics. This is in response to an MRI from 04/18/15 that showed increased marrow signal and loss of the proximal fifth metatarsal cortex evidence of osteomyelitis with likely early osteomyelitis in the cuboid bone as well. She has a large wound over the base of the fifth metatarsal. She also has a eschar over her the tips of her toes on 1,3 and 5. She does not have peripheral pulses and apparently is going for an angiogram tomorrow which seems reasonable. After this she is going to infectious disease at [facility]. They have been using Medihoney to the large wound on the lateral aspect of the left foot to. The patient has known Charcot deformity from diabetic neuropathy. She also has known diabetic PAD. Surprisingly I can't see that she has had any recent antibiotics, the patient states the last antibiotic she had was at the end of November for 10 days. I think this was in response to culture that showed group G strep although I'm not exactly sure where the culture was from. She is also had arterial studies on 03/29/15. This showed a right ABI of 1.4 that was noncompressible. Her left ABI was 0.73. 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 It was not felt that arterial inflow was adequate for healing of a foot ulcer. Her Doppler waveforms looked monophasic ===== READMISSION 02/28/17; this is in an now 48 year old woman we've had at several different occasions in this clinic. 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 She  has a remote ex-smoker. She was last seen in this clinic by Dr. Con Memos I think in May. More recently she is been followed by her podiatrist [name] an infectious disease Dr. Megan Salon. She has 2 open wounds the major one is over the right first metatarsal head she also has a wound on the left plantar foot. an MRI of the right foot on 01/01/17 showed a soft tissue ulcer along the plantar aspect of the first metatarsal base consistent with osteomyelitis of the first metatarsal stump. Dr. 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 According to the patient this is been open for slightly over a month. She has been on a combination of Cipro 500 twice a day, Zyvox 600 twice a day and Flagyl 500 3 times a day for over a month now as directed by Dr. Megan Salon. cultures of the right foot earlier this year showed MRSA in January and Serratia in May. January also had a few viridans strep. Recent x-rays of both feet were done and [name] office and I don't have these reports. The patient has known PAD and has a history of aleft femoropopliteal bypass in April 2017. 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 Dwyane Dee. In spite of this the last hemoglobin A1c I can see is 10.1 on 01/01/2017. She is being referred by 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 She is been using Medihoney to this area and also an area  on the left midfoot. She is using healing sandals bilaterally. ABIs in this clinic at the left posterior tibial was 1.1 noncompressible on the right READMISSION Non invasive vascular NOVANT 5/18 Aftercare following surgery of the circulatory system Procedure Note - Interface, External Ris  In - 08/13/2016 11:05 AM EDT Procedure: Examination consists of physiologic resting arterial pressures of the brachial and ankle arteries bilaterally with continuous wave Doppler waveform analysis. Previous: Previous exam performed on 02/14/16 demonstrated ABIs of Rt = 1.19 and Lt = 1.33. Right: ABI = non-compressible PT, 1.47 DP. S/P transmet amputation. Left: ABI = 1.52, 2nd digit pressure = 87 mmHg Conclusions: Right: ABI (>1.3) may be falsely elevated, suggesting medial calcification. Left: ABI (>1.3) may be falsely elevated, suggesting medial calcification The patient is a now 48 year old type I diabetic is had multiple issues her graded to chronic diabetic foot ulcers. 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 We had her in the clinic lastin November. At that point she had wounds on her bilateral feet.she had wanted to try hyperbarics however the healogics review process denied her because she hadn't followed up with her vascular surgeon for her left femoropopliteal bypass. The bypass was done by Dr. Raul Del at [facility]. We made her a follow-up with Dr. Raul Del however she did not keep the appointment and therefore she was not approved The patient shows me a small wound on her left fourth metatarsal head on her phone. She developed rapid discoloration in the plantar aspect of the left foot and she was admitted to hospital from 2/2 through 05/10/17 with wet gangrene of the left foot osteomyelitis of the fourth metatarsal heads. She was admitted acutely ill with a temperature of 103. She was started on broad-spectrum vancomycin and cefepime. On 05/06/17 she was taken to the OR by [name] her podiatric surgeon for an incision and drainage irrigation of the left foot wound. Cultures from this surgery revealed group be strep and anaerobes. she was seen by Dr.Xu of orthopedic surgery and scheduled for a  below-knee amputation which she u refused. Ultimately she was discharged on Levaquin and Flagyl for one month. MRI 05/05/17 done while she was in the hospital showed abscess adjacent to the fourth metatarsal head and neck small abscess around the fourth flexor tendon. Inflammatory phlegmon and gas in the soft tissues along the lateral aspect of the fourth phalanx. Findings worrisome for osteomyelitis involving the fourth proximal and middle phalanx and also the third and fourth metatarsals. Finally the patient had actually shortly before this followed up with Dr. Raul Del at no time on 04/29/17. He felt that her left femoropopliteal bypass was patent he felt that her left-sided toe pressures more than adequate for healing a wound on the left foot. This was before her acute presentation. 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 05/28/17; she is started hyperbarics. The patient tells me that for some reason she was not actually on Levaquin but I think on ciprofloxacin. She was on Flagyl. She only started her Levaquin yesterday due to some difficulty with the pharmacy and perhaps her sister picking it up. She has an appointment with [name] tomorrow and with infectious disease early next week. She has no new complaints 06/06/17; the patient continues in hyperbarics. She saw [name] on 05/29/17 who is her podiatric surgeon. He is elected for a transmetatarsal amputation on 06/27/17. I'm not sure at what level he plans to do this amputation. The patient is  unaware She also saw Dr. Megan Salon of infectious disease who elected to continue her on current antibiotics I think this is ciprofloxacin and Flagyl. I'll need to clarify with her tomorrow if she actually has this. We're using silver alginate to the actual wound. Necrotic surface today with material under the flap of her foot. Original MRI showed abscesses as well as osteomyelitis of the proximal and middle fourth phalanx and the third and fourth  metatarsal heads 06/11/17; patient continues in hyperbarics and continues on oral antibiotics. She is doing well. The wound looks better. The necrotic part of this under the flap in her superior foot also looks better. she is been to see [name]. I haven't had a chance to look at his note. Apparently he has put the transmetatarsal amputation on hold her request it is still planning to take her to the OR for debridement and product application ACEL. I'll see if I can find his note. I'll therefore leave product ordering/requests to [name] for now. I was going to look at Dermagraft 06/18/17-she is here in follow-up evaluation for bilateral foot wounds. She continues with hyperbaric therapy. She states she has been applying manuka honey to the right plantar foot and alternate manuka honey and silver alginate to the left foot, despite our orders. We will continue with same treatment plan and she will follo up next week. 06/25/17; I have reviewed [name] last note from 3/11. She has operative debridement in 2 days' time. By review his note apparently they're going to place there is skin over the majority of this wound which is a good choice. She has a small satellite area at the most proximal part of this wound on the left plantar foot. The area on the right plantar foot we've been using silver alginate and it is close to healing. 07/02/17; unfortunately the patient was not easily approved for [name] proposed surgery. I'm not completely certain what the issue is. She has been using silver alginate to the wound she has completed a first course of hyperbarics. She is still on Levaquin and Flagyl. I have really lost track of the time course here.I suspect she should have another week to 2 of antibiotics. I'll need to see if she is followed up with infectious disease Dr. Megan Salon 07/09/17; the patient is followed up with Dr. Megan Salon. She has a severe deep diabetic infection of her left foot with a deep  surgical wound. She continues on Levaquin and metronidazole continuing both of these for now I think she is been on fr about 6 weeks. She still has some drainage but no pain. No fever. Her had been plans for her to go to the OR for operative debridement with her podiatrist [name], I am not exactly sure where that is. I'll probably slip a note to [name] today. I note that she follows with Dr. Dwyane Dee of endocrinology. We have her recertified for hyperbaric oxygen. I have not heard about Dermagraft however I'll see if [name] is planning a skin substitute as well 07/16/17; the patient tells me she is just about out of [place]. I'll need to check Dr. Hale Bogus last notes on this. She states she has plenty of Flagyl however. She comes in today complaining of pain in the right lateral foot which she said lasted for about a day. The wound on the right foot is actually much more medially. She also tells me that the [facility] cost a lot of pain in the left  foot wound and she turned back to silver alginate. Finally Dermagraft has a $284 per application co-pay. She cannot afford this 07/23/17; patient arrives today with the wound not much smaller. There is not much new to add. She has not heard from [name] all try to put in a call to them today. She was asking about Dermagraft again and she has an over $132 per application co-pay she states that she would be willing to try to do a payment plan. I been tried to avoid this. We've been using silver alginate, I'll change to [facility] 07/30/17-She is here in follow-up evaluation for left foot ulcer. She continues hyperbaric medicine. The left foot ulcer is stable we will continue with same treatment plan 08/06/17; she is here for evaluation of her left foot ulcer. Currently being treated for hyperbarics or underlying osteomyelitis. She is completed antibiotics. The left foot ulcer is better smaller with healthier looking granulation. For various  reasons I am not really clear on we never got her back to the OR with [name]. He did not respond to my secure text message. Nevertheless I think that surgery on this point is not necessary nor am I completely clear that a skin substitute is necessary The patient is complaining about pain on the outside of her right foot. 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 There is no erythema. She also states the foot is warm versus her other part of her upper leg and this is largely true. It is not totally clear to me what's causing this. She thinks it's different from her usual neuropathy pain 08/13/17; she arrives in clinic today with a small wound which is superficial on her right first metatarsal head. 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 She tells [place] she was up on her feet over the Mother's Day celebration. The large wound is on the left foot. Continues with hyperbarics for underlying osteomyelitis. We're using Hydrofera Blue. She asked me today about where we were with Dermagraft. I had actually excluded this because of the co- pay however she wants to assume this therefore I'll recheck the co-pay an order for next week. 08/20/17; the patient agreed to accept the co-pay of the first Apligraf which we applied today. She is disappointed she is finishing hyperbarics will run this through the insurance on the extent of the foot infection and the extent of the wound that she had however she is already had 60 dive's. Dermagraft No. 1 08/27/17; Dermagraft No. 2. She is not eligible for any more hyperbaric treatments this month. She reports a fair amount of drainage and she actually changed to the external dressings without disturbing the direct contact layer 09/03/17; the patient arrived in clinic today with the wound superficially looking quite healthy. Nice vibrant red tissue with some advancing epithelialization although not as much adherence of the flap as I might  like. However she noted on her own fourth toe some bogginess and she brought that to our attention. Indeed this was boggy feeling like a possibility of subcutaneous fluid. 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 She is not been unwell. We've been using Dermagraft 09/10/17; the culture that I did not last week was MRSA. She saw Dr. Megan Salon this morning who is going to start her on vancomycin. I had sent him a secure a text message yesterday. I also spoke with her podiatric surgeon  [name] about surgery on this foot the options for conserving a functional foot etc. Promised me he would see her and will make back consultation today. Paradoxically her actual wound on the plantar aspect of her left foot looks really quite good. I had given her 5 days worth of Baxdella to cover her for MRSA. Her MRI came back showing osteomyelitis within the third metatarsal shaft and head and base of the third and fourth proximal phalanx. She had extensive inflammatory changes throughout the soft tissue of the lateral forefoot. With an ill-defined fluid around the fourth metatarsal extending into the plantar and dorsal soft tissues 09/19/17; the patient is actually on oral Septra and Flagyl. She apparently refused IV vancomycin. She also saw [name] at my request who is planning her for a left BKA sometime in mid July. MRI showed osteomyelitis within the third metatarsal shaft and head and the basis of the third and fourth proximal phalanx. I believe there was felt to be possible septic arthritis involving the third MTP. 09/26/17; the patient went back to Dr. Megan Salon at my suggestion and is now receiving IV daptomycin. Her wound continues to look quite good making the decision to proceed with a transmetatarsal amputation although more difficult for the patient. I believe in my extensive discussions with her she has a good sense of the pros and  cons of this. I don't NV the tuft decision she has to make. She has an appointment with [name] I believe in mid July and I previously spoken to him about this issue Has we had used 3 previous Dermagraft. Given the condition of the wound surface I went ahead and added the fourth one today area and I did this not fully realizing that she'll be traveling to [place] next week. I'm hopeful she can come back in 2 weeks 10/21/17; Her same Dermagraft on for about 3-1/2 weeks. In spite of this the wound arrives looking quite healthy. There is been a lot of healing dimensions are smaller. Looking at the square shaped wound she has now there is some undermining and some depth medially under the undermining although I cannot palpate any bone. No surrounding infection is obvious. She has difficult questions about how to look at this going forward vis--vis amputations versus continued medical therapy. To be truthful the wound is looks so healthy and it is continued to contract. Hard to justify foot surgery at this point although I still told her that I think it might come to that if we are not able to eradicate the underlying MRSA. She is still highly at risk and she understands this 11/06/17 on evaluation today patient appears to be doing better in regard to her foot ulcer. She's been tolerating the dressing changes without complication. Currently she is here for her Dermagraft #6. Her wound continues to make excellent progress at this point. She does not appear to have any evidence of infection which is good news. 11/13/17 on evaluation today patient appears to be doing excellent at this time. She is here for repeat Dermagraft application. This is #7. Overall her wound seems to be making great progress. 12/05/17; the patient arrives with the wound in much better condition than when I last saw this almost 6 weeks ago. She still has a small probing area in the left metatarsal head region on the lateral aspect of  her foot. We applied her last Dermagraft today. Since the last time she is here she has what appears to a been  a blood blister on the plantar aspect of left foot although I don't see this is threatening. There is also a thick raised tissue on the right mid metatarsal head region. This was not there I don't think the last time she was here 3 weeks ago. 12/12/17; the patient continues to have a small programming area in the left metatarsal head region on the lateral aspect of her foot which was the initial large surgical wound. I applied her last Apligraf last week. I'm going to use Endoform starting today Unfortunately she has an excoriated area in the left mid foot and the right mid foot. The left midfoot looks like a blistered area this was not opened last week it certainly is open today. Using silver alginate on these areas. She promises me she is offloading this. 12/19/17; the small probing area in the left metatarsal head eyes think is shallower. In general her original wound looks better. We've been using Endoform. The area inferiorly that I think was trauma last week still requires debridement a lot of nonviable surface which I removed. She still has an open open area distally in her foot Similarly on the right foot there is tightly adherent surface debris which I removed. Still areas that don't look completely epithelialized. This is a small open area. We used silver alginate on these areas 12/26/2017; the patient did not have the supplies we ordered from last week including the Endoform. The original large wound on the left lateral foot looks healthy. She still has the undermining area that is largely unchanged from last week. She has the same heavily callused raised edged wounds on the right mid and left midfoot. Both of these requiring debridement. We have been using silver alginate on these areas 01/02/2018; there is still supply issues. We are going to try to use Prisma but I am not sure  she actually got it from what she is saying. She has a new open area on the lateral aspect of the left fourth toe [previous fifth ray amputation]. Still the one tunneling area over the fourth metatarsal head. The area is in the midfoot bilaterally still have thick callus around them. She is concerned about a raised swelling on the lateral aspect of the foot. However she is completely insensate 01/10/2018; we are using Prisma to the wounds on her bilateral feet. Surprisingly the tunneling area over the left fourth metatarsal head that was part of her original surgery has closed down. She has a small open area remaining on the incision line. 2 open areas in the midfoot. 02/10/2018; the patient arrives back in clinic after a month hiatus. She was traveling to visit family in [place]. Is fairly clear she was not offloading the areas on her feet. The original wound over the left lateral foot at the level of metatarsal heads is reopened and probes medially by about a centimeter or 2. She notes that a week ago she had purulent drainage come out of an area on the left midfoot. Paradoxically the worst area is actually on the right foot is extensive with purulent drainage. We will use silver alginate today 02/17/2018; the patient has 3 wounds one over the left lateral foot. She still has a small area over the metatarsal heads which is the remnant of her original surgical wound. This has medial probing depth of roughly 1.4 cm somewhat better than last week. The area on the right foot is larger. We have been using silver alginate to all areas. The area on  the right foot and left foot that we cultured last week showed both Klebsiella and Proteus. Both of these are quinolone sensitive. The patient put her's self on Bactrim and Flagyl that she had left hanging around from prior antibiotic usages. She was apparently on this last week when she arrived. I did not realize this. Unfortunately the Bactrim will not  cover either 1 of these organisms. We will send in Cipro 500 twice daily for a week 03/04/2018; the patient has 2 wounds on the left foot one is the original wound which was a surgical wound for a deep DFU. At one point this had exposed bone. She still has an area over the fourth metatarsal head that probes about 1.4 cm although I think this is better than last week. I been using silver nitrate to try and promote tissue adherence and been using silver alginate here. She also has an area in the left midfoot. This has some depth but a small linear wound. Still requiring debridement. On the right midfoot is a circular wound. A lot of thick callus around this area. We have been using silver alginate to all wound areas She is completed the ciprofloxacin I gave her 2 weeks ago. 03/11/2018; the patient continues to have 2 open areas on the left foot 1 of which was the original surgical wound for a deep DFU. Only a small probing area remains although this is not much different from last week we have been using silver alginate. The other area is on the midfoot this is smaller linear but still with some depth. We have been using silver alginate here as well On the right foot she has a small circular wound in the mid aspect. This is not much smaller than last time. We have been using silver alginate here as well 03/18/2018; she has 3 wounds on the left foot the original surgical wound, a very superficial wound in the mid aspect and then finally the area in the mid plantar foot. She arrives in today with a very concerning area in the wound in the mid plantar foot which is her most proximal wound. There is undermining here of roughly 1-1/2 cm superiorly. Serosanguineous drainage. She tells me she had some pain on for over the weekend that shot up her foot into her thigh and she tells me that she had a nodule in the groin area. She has the single wound in the right foot. We are using endoform to both wound  areas 03/24/2018; the patient arrives with the original surgical wound in the area on the left midfoot about the same as last week. There is a collection of fluid under the surface of the skin extending from the surgical wound towards the midfoot although it does not reach the midfoot wound. The area on the right foot is about the same. Cultures from last week of the left midfoot wound showed abundant Klebsiella abundant Enterococcus faecalis and moderate methicillin resistant staph I gave her Levaquin but this would have only covered the Klebsiella. She will need linezolid 04/01/2018; she is taking linezolid but for the first few days only took 1 a day. I have advised her to finish this at twice daily dosing. In any case all of her wounds are a lot better especially on the left foot. The original surgical wound is closed. The area on the left midfoot considerably smaller. The area on the right foot also smaller. 04/08/2018; her original surgical wound/osteomyelitis on the left foot remains closed. She  has area on the left foot that is in the midfoot area but she had some streaking towards this. This is not connected with her original wound at least not visually. Small wound on the right midfoot appears somewhat smaller. 04/15/18; both wounds looks better. Original wound is better left midfoot. Using silver alginate 1/21; patient states she uses saltwater soak in, stones or remove callus from around her wounds. She is also concerned about a blood blister she had on the left foot but it simply resolved on its own. We've been using silver alginate 1/28; the patient arrives today with the same streaking area from her metatarsals laterally [the site of her original surgical wound] down to the middle of her foot. There is some drainage in the subcutaneous area here. This concerns me that there is actually continued ongoing infection in the metatarsals probably the fourth and third. This fixates an MRI  of the foot without contrast [chronic renal failure] The wound in the mid part of the foot is small but I wonder whether this area actually connects with the more distal foot. The area on the right midfoot is probably about the same. Callus thick skin around the small wound which I removed with a curette we have been using silver alginate on both wound areas 2/4; culture I did of the draining site on the left foot last time grew methicillin sensitive staph aureus. MRI of the left foot showed interval resolution of the findings surrounding the third metatarsal joint on the prior study consistent with treated osteomyelitis. Chronic soft tissue ulceration in the plantar and lateral aspect of the forefoot without residual focal fluid collection. No evidence of recurrent osteomyelitis. 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 She tells me she had nausea and vomiting over the weekend and missed 2 days. In general her foot looks much the same. She has a small open area just below the left fourth metatarsal head. A linear area in the left midfoot. Some discoloration extending from the inferior part of this into the left lateral foot although this appears to be superficial. She has a small area on the right midfoot which generally looks smaller after debridement 2/18; the patient is completing his cephalexin and has another 2 days. She continues to have open areas on the left and right foot. 2/25; she is now off antibiotics. The area on the left foot at the site of her original surgical wound has closed yet again. She still has open areas in the mid part of her foot however these appear smaller. The area on the right mid foot looks about the same. We have been  using silver alginate She tells me she had a serious hypoglycemic spell at home. She had to have EMS called and get IV dextrose 3/3; disappointing on the left lateral foot large area of necrotic tissue surrounding the linear area. This appears to track up towards the same original surgical wound. Required extensive debridement. The area on the right plantar foot is not a lot better also using silver 3/12; the culture I did last time showed abundant enterococcus. I have prescribed Augmentin, should cover any unrecognized anaerobes as well. In addition there were a few MRSA and Serratia that would not be well covered although  I did not want to give her multiple antibiotics. She comes in today with a new wound in the right midfoot this is not connected with the original wound over her MTP a lot of thick callus tissue around both wounds but once again she said she is not walking on these areas 3/17-Patient comes in for follow-up on the bilateral plantar wounds, the right midfoot and the left plantar wound. Both these are heavily callused surrounding the wounds. We are continuing to use silver alginate, she is compliant with offloading and states she uses a wheelchair fairly often at home 3/24; both wound areas have thick callus. However things actually look quite a bit better here for the majority of her left foot and the right foot. 3/31; patient continues to have thick callused somewhat irritated looking tissue around the wounds which individually are fairly superficial. There is no evidence of surrounding infection. We have been using silver alginate however I change that to [facility] today 4/17; patient returns to clinic after having a scare with Covid she tested negative in her primary doctor's office. She has been using Hydrofera Blue. She does not have an open area on the right foot. On the left foot she has a small open area with the mid area not completely viable. She showed me pictures  of what looks like a hemorrhagic blister from several days ago but that seems to have healed over this was on the lateral left foot 4/21; patient comes in to clinic with both her wounds on her feet closed. However over the weekend she started having pain in her right foot and leg up into the thigh. She felt as though she was running a low-grade fever but did not take her temperature. She took a doxycycline that she had leftover and yesterday a single Septra and metronidazole. She thinks things feel somewhat better. 4/28; duplex ultrasound I ordered last week was negative for DVT or superficial thrombophlebitis. She is completed the doxycycline I gave her. States she is still having a lot of pain in the right calf and right ankle which is no better than last week. She cannot sleep. She also states she has a temperature of up to 101, coughing and complaining of visual loss in her bilateral eyes. Apparently she was tested for Covid 2 weeks ago at [facility] and that was negative. Readmission: 09/03/18 patient presents back for reevaluation after having been evaluated at the end of April regarding erythema and swelling of her right lower extremity. Subsequently she ended up going to the hospital on 07/29/18 and was admitted not to be discharged until 08/08/18. Unfortunately it was noted during the time that she was in the hospital that she did have methicillin-resistant Staphylococcus aureus as the infection noted at the site. It was also determined that she did have osteomyelitis which appears to be fairly significant. She was treated with vancomycin and in fact is still on IV vancomycin at dialysis currently. This is actually slated to continue until 09/12/18 at least which will be the completion of the six weeks of therapy. Nonetheless based on what I'm seeing at this point I'm not sure she will be anywhere near ready to discontinue antibiotics at that time. Since she was released from the hospital she was  seen by [name] who is her podiatrist on 08/27/18. His note specifically states that he is recommended that the patient needs of one knee amputation on the right as she has a life-threatening situation that can lead quickly  to sepsis. The patient advised she would like to try to save her leg to which [name] apparently told her that this was against all medical advice. She also want to discontinue the Wound VAC which had been initiated due to the fact that she wasn't pleased with how the wound was looking and subsequently she wanted to pursue applying Medihoney at that time. He stated that he did not believe that the right lower extremity was salvageable and that the patient understood but would still like to attempt hyperbaric option therapy if it could be of any benefit. She was therefore referred back to [place] for further evaluation. He plans to see her back next week. Upon inspection today patient has a significant amount purulent drainage noted from the wound at this point. The bone in the distal portion of her foot also appears to be extremely necrotic and spongy. When I push down on the bone it bubbles and seeps purulent drainage from deeper in the end of the foot. I do not think that this is likely going to heal very well at all and less aggressive surgical debridement were undertaken more than what I believe we can likely do here in our office. 09/12/2018; I have not seen this patient since the most recent hospitalization although she was in our clinic last week. I have reviewed some of her records from a complex hospitalization. 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 There is situation was complicated by MRSA bacteremia and acute on chronic renal failure now on dialysis. She is receiving vancomycin at dialysis. We started her on Dakin's wet-to- dry last week she is changing this daily. There is still purulent drainage coming out of her  foot. Although she is apparently "agreeable" to a below-knee amputation which is been suggested by multiple clinicians she wants this to be done in [place]. She apparently has a telehealth visit with that provider sometime in late [place] 6/24. I have told her I think this is probably too long. Nevertheless I could not convince her to allow a local doctor to perform BKA. 09/19/2018; the patient has a large necrotic area on the right anterior foot. She has had previous transmetatarsal amputations. Culture I did last week showed MRSA nothing else she is on vancomycin at dialysis. She has continued leaking purulent drainage out of the distal part of the large circular wound on the right anterior foot. She apparently went to see Dr. 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 Somehow that translated into her being referred to plastic surgery for debridement of the area. I gather she basically refused amputation although I do not have a copy of Dr. Berenice Primas notes. The patient really wants to have a trial of hyperbaric oxygen. I agreed with initial assessment in this clinic that this was probably too far along to benefit however if she is going to have plastic surgery I think she would benefit from ancillary hyperbaric oxygen. The issue here is that the patient has benefited as maximally as any patient I have ever seen from hyperbaric oxygen therapy. Most recently she had exposed bone on the lateral part of her left foot after a surgical procedure and that actually has closed. She has eschared areas in both heels but no open area. She is remained systemically well. I am not optimistic that anything can be done about this but the patient is very clear that she wants an attempt. The attempt would  include a wound VAC further debridements and hyperbaric oxygen along with IV antibiotics. 6/26; I put her in for a trial of hyperbaric oxygen only because of the dramatic response she  has had with wounds on her left midfoot earlier this year which was a surgical wound that went straight to her bone over the metatarsal heads and also remotely the left third toe. We will see if we can get this through our review process and insurance. She arrives in clinic with again purulent material pouring out of necrotic bone on the top of the foot distally. There is also some concerning erythema on the front of the leg that we marked. It is bit difficult to tell how tender this is because of neuropathy. I note from infectious disease that she had her vancomycin extended. 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 She had the wound VAC on for part of the week. The rest of the time she is putting various things on this including Medihoney, "ionized water" silver sorb gel etc. 7/7; follow-up along with HBO. She is still on vancomycin at dialysis. She has a large open area on the dorsal right foot and a small dark eschar area on her heel. There is a lot less erythema in the area and a lot less tenderness. From an infection point of view I think this is better. She still has a lot of necrosis in the remaining right forefoot [previous TMA] we are still using the wound VAC in this area 7/16; follow-up along with HBO. I put her on linezolid after she finished her vancomycin. We started this last Friday I gave her 2 weeks worth. I had the expectation that she would be operatively debrided by Dr. Marla Roe but that still has not happened yet. Patient phoned the office this week. She arrives for review today after HBO. The distal part of this wound is completely necrotic. Nonviable pieces of tendon bone was still purulent drainage. Also concerning that she has black eschar over the heel that is expanding. I think this may be indicative of infection in this area as well. She has less erythema and warmth in the ankle and calf but still an abnormal exam 7/21 follow-up along  with HBO. I will renew her linezolid after checking a CBC with differential monitoring her blood counts especially her platelets. She was supposed to have surgery yesterday but if I am reading things correctly this was canceled after her blood sugar was found to be over 500. I thought Dr. Marla Roe who called me said that they were sending her to the ER but the patient states that was not the case. 7/28. Follow-up along with HBO. She is on linezolid I still do not have any lab work from dialysis even though I called last week. The patient is concerned about an area on her left lateral foot about the level of the base of her fifth metatarsal. I did not really see anything that ominous here however this patient is in [place] ability to point out problems that she is sensing and she has been accurate in the past Finally she received a call from Dr. Marla Roe who is referring her to another orthopedic surgeon stating that she is too booked up to take her to the operating room now. Was still using a wound VAC on the foot 8/3 -Follow-up after HBO, she is got another week of linezolid, she is to call ID for an appointment, x-rays of both feet were  reviewed, the left foot x-ray with third MTP joint osteo- Right foot x-ray widespread osteo-in the right midfoot Right ankle x-ray does not show any active evidence of infection 8/11-Patient is seen after HBO, the wounds on the right foot appear to be about the same, the heel wound had some necrotic base over tendon that was debrided with a curette 8/21; patient is seen after HBO. The patient's wound on her dorsal foot actually looks reasonably good and there is substantial amount of epithelialization however the open area distally still has a lot of necrotic debris partially bone. I cannot really get a good sense of just how deep this probes under the foot. She has been pressuring me this week to order medical maggots through a company in [place] for her.  The problem I have is there is not a defined wound area here. On the positive side there is no purulence. She has been to see infectious disease she is still on Septra DS although I have not had a chance to review their notes 8/28; patient is seen in conjunction with HBO. The wounds on her foot continued to improve including the right dorsal foot substantially the, the distal part of this wound and the area on the right heel. We have been using a wound VAC over this chronically. She is still on trimethoprim as directed by infectious disease 9/4; patient is seen in conjunction with HBO. Right dorsal foot wound substantially anteriorly is better however she continues to have a deep wound in the distal part of this that is not responding. We have been using silver collagen under border foam Area on the right plantar medial heel seems better. We have been using Hydrofera Blue 12/12/18 on evaluation today patient appears to be doing about the same with regard to her wound based on prior measurements. She does have some necrotic tissue noted on the lateral aspect of the wound that is going require a little bit of sharp debridement today. This includes what appears to be potentially either severely necrotic bone or tendon. Nonetheless other than that she does not appear to have any severe infection which is good news 9/18; it is been 2 weeks since I saw this wound. She is tolerating HBO well. Continued dramatic improvement in the area on the right dorsal foot. She still has a small wound on the heel that we have been using Hydrofera Blue. She continues with a wound VAC 9/24; patient has to be seen emergently today with a swelling on her right lateral lower leg. She says that she told Dr. Evette Doffing about this and also myself on a couple of occasions but I really have no recollection of this. She is not systemically unwell and her wound really looked good the last time I saw this. She showed this to  providers at dialysis and she was able to verify that she was started on cephalexin today for 5 doses at dialysis. She dialyzes on Tuesday Thursday and Saturday. 10/2; patient is seen in conjunction with HBO. The area that is draining on the right anterior medial tibia is more extensive. Copious amounts of serosanguineous drainage with some purulence. We are still using the wound VAC on the original wound then it is stable. Culture I did of the original IandD showed MRSA I contacted dialysis she is now on vancomycin with dialysis treatments. I asked them to run a month 10/9; patient seen in conjunction with HBO. She had a new spontaneous open area just above the wound  on the right medial tibia ankle. More swelling on the right medial tibia. Her wound on the foot looks about the same perhaps slightly better. There is no warmth spreading up her leg but no obvious erythema. her MRI of the foot and ankle and distal tib-fib is not booked for next Friday I discussed this with her in great detail over multiple days. it is likely she has spreading infection upper leg at least involving the distal 25% above the ankle. She knows that if I refer her to orthopedics for infectious disease they are going to recommend amputation and indeed I am not against this myself. We had a good trial at trying to heal the foot which is what she wanted along with antibiotics debridement and HBO however she clearly has spreading infection [probably staph aureus/MRSA]. Nevertheless she once again tells me she wants to wait the left of the MRI. She still makes comments about having her amputation done in [place]. 10/19; arrives today with significant swelling on the lateral right leg. Last culture I did showed Klebsiella. Multidrug- resistant. Cipro was intermediate sensitivity and that is what I have her on pending her MRI which apparently is going to be done on Thursday this week although this seems to be moving back and  forth. She is not systemically unwell. We are using silver alginate on her major wound area on the right medial foot and the draining areas on the right lateral lower leg 10/26; MRI showed extensive abscess in the anterior compartment of the right leg also widespread osteomyelitis involving osseous structures of the midfoot and portions of the hindfoot. Also suspicion for osteomyelitis anterior aspect of the distal medial malleolus. Culture I did of the purulence once again showed a multidrug-resistant Klebsiella. I have been in contact with nephrology late last week and she has been started on cefepime at dialysis to replace the vancomycin We sent a copy of her MRI report to Dr. Geroge Baseman in [place] who is an orthopedic surgeon. The patient takes great stock in his opinion on this. She says she will go to [place] to have her leg amputated if Dr. Geroge Baseman does not feel there is any salvage options. 11/2; she still is not talk to her orthopedic surgeon in [place]. Apparently he will call her at [phone] this afternoon. The quality of this is she has not allowed me to refer her anywhere. She has been told over and over that she needs this amputated but has not agreed to be referred. She tells me her blood sugar was 600 last night but she has not been febrile. 11/9; she never did got a call from the orthopedic surgeon in [place] therefore that is off the radar. We have arranged to get her see orthopedic surgery at [facility]. She still has a lot of draining purulence coming out of the new abscess in her right leg although that probably came from the osteomyelitis in her right foot and heel. Meanwhile the original wound on the right foot looks very healthy. Continued improvement. The issue is that the last MRI showed osteomyelitis in her right foot extensively she now has an abscess in the right anterior lower leg. There is nobody in [place] who will offer this woman anything but an amputation  and to be honest that is probably what she needs. I think she still wants to talk about limb salvage although at this point I just do not see that. She has completed her vancomycin at dialysis which was for the original  staph aureus she is still on cefepime for the more recent Klebsiella. She has had a long course of both of these antibiotics which should have benefited the osteomyelitis on the right foot as well as the abscess. 11/16; apparently [place] elective surgery is shut down because of COVID-19 pandemic. I have reached out to some contacts at [facility] to see if we can get her an orthopedic appointment there. I am concerned about continually leaving this but for the moment everything is static. In fact her original large wound on this foot is closing down. It is the abscess on the right anterior leg that continues to drain purulent serosanguineous material. She is not currently on any antibiotics however she had a prolonged course of vancomycin [1 month] as well as cefepime for a month 02/24/2019 on evaluation today patient appears to be doing better than the last time I saw her. This is not a patient that I typically see. With that being said I am covering for Dr. Dellia Nims this week and again compared to when I last saw her overall the wounds in particular seem to be doing significantly better which is good news. With that being said the patient tells me several disconcerting things. She has not been able to get in to see anyone for potential debridement in regard to her leg wounds although she tells me that she does not think it is necessary any longer because she is taking care of that herself. She noticed a string coming out of the lower wound on her leg over the last week. The patient states that she subsequently decided that we must of pack something in there and started pulling the string out and as it kept coming and coming she realized this was likely her tendon. With that being  said she continued to remove as much of this as she could. She then I subsequently proceeded to using tubes of antibiotic ointment which she will stick down into the wound and then scored as much as she can until she sees it coming out of the other wound opening. She states that in doing this she is actually made things better and there is less redness and irritation. With regard to her foot wound she does have some necrotic tendon and tissue noted in one small corner but again the actual wound itself seems to be doing better with good granulation in general compared to my last evaluation. Electronic Signature(s) Signed: 02/24/2019 10:29:27 PM By: Worthy Keeler PA-C Entered By: Worthy Keeler on 02/24/2019 08:33:59 -------------------------------------------------------------------------------- Chemical Cauterization Details Patient Name: Date of Service: [name] 02/24/2019 8:45 AM Medical Record Number:[id number] Patient Account Number: [id number] Date of Birth/Sex: Treating RN: 09-30-1970 (48 y.o. [name]: [name] Other Clinician: Referring Provider: Treating Provider/Extender:Stone III, Lennox Grumbles, [name] in Treatment: 24 Procedure Performed for: Wound #47 Right,Anterior Lower Leg Performed By: Physician Worthy Keeler, PA Post Procedure Diagnosis Same as Pre-procedure Notes silver nitrate Electronic Signature(s) Signed: 02/24/2019 10:29:27 PM By: Worthy Keeler PA-C Signed: 03/10/2019 2:55:30 PM By: Carlene Coria RN Entered By: Carlene Coria on 02/24/2019 08:19:39 -------------------------------------------------------------------------------- Physical Exam Details Patient Name: Date of Service: [name] 02/24/2019 8:45 AM Medical Record Number:[id number] Patient Account Number: [id number] Date of Birth/Sex: Treating RN: 1970/11/29 (48 y.o. F) Primary Care Provider: [name] Other  Clinician: Referring Provider: Treating Provider/Extender:Stone III, Lennox Grumbles, [name] in Treatment: 89 Constitutional Well-nourished and well-hydrated in no acute distress. Respiratory normal breathing without  difficulty. clear to auscultation bilaterally. Cardiovascular regular rate and rhythm with normal S1, S2. Psychiatric this patient is able to make decisions and demonstrates good insight into disease process. Alert and Oriented x 3. pleasant and cooperative. Notes I did actually perform some chemical cauterization of the leg wound at this point in order to help with some hyper granular tissue that she is experiencing at this point. I am hopeful this will help things to somewhat heal. With regard to the foot ulcer I actually did perform debridement of necrotic tendon and debris from the surface of the wound down to good and mostly healthy tissue. Nonetheless she tolerated all this without any complication she has no feeling in the extremity. Electronic Signature(s) Signed: 02/24/2019 10:29:27 PM By: Worthy Keeler PA-C Entered By: Worthy Keeler on 02/24/2019 08:34:38 -------------------------------------------------------------------------------- Physician Orders Details Patient Name: Date of Service: [name] 02/24/2019 8:45 AM Medical Record Number:[id number] Patient Account Number: [id number] Date of Birth/Sex: Treating RN: 20-Dec-1970 (48 y.o. [name]: [name] Other Clinician: Referring Provider: Treating Provider/Extender:Stone III, Lennox Grumbles, [name] in Treatment: 24 Verbal / Phone Orders: No Diagnosis Coding ICD-10 Coding Code Description [phone] Other chronic osteomyelitis, right ankle and foot L97.514 Non-pressure chronic ulcer of other part of right foot with necrosis of bone E10.621 Type 1 diabetes mellitus with foot ulcer L02.415 Cutaneous abscess of right lower limb Follow-up  Appointments Return Appointment in 1 week. Dressing Change Frequency Wound #43 Right,Medial Foot Change dressing three times week. Wound #44 Right Calcaneus Change dressing three times week. Wound #46 Right,Lateral Lower Leg Change dressing three times week. Wound #47 Right,Anterior Lower Leg Change dressing three times week. Wound Cleansing Clean wound with Wound Cleanser - Anasept to all wounds Primary Wound Dressing Wound #43 Right,Medial Foot Calcium Alginate with Silver Wound #44 Right Calcaneus Calcium Alginate with Silver Wound #46 Right,Lateral Lower Leg Calcium Alginate with Silver - do not pack, place over wound bed Wound #47 Right,Anterior Lower Leg Calcium Alginate with Silver Secondary Dressing Wound #43 Right,Medial Foot Kerlix/Rolled Gauze ABD pad Wound #44 Right Calcaneus Kerlix/Rolled Gauze Dry Gauze Heel Cup Wound #46 Right,Lateral Lower Leg Kerlix/Rolled Gauze ABD pad Wound #47 Right,Anterior Lower Leg Kerlix/Rolled Gauze ABD pad Edema Control Elevate legs to the level of the heart or above for 30 minutes daily and/or when sitting, a frequency of: - throughout the day [facility] for wound care. - Interim Electronic Signature(s) Signed: 02/24/2019 10:29:27 PM By: Worthy Keeler PA-C Signed: 03/10/2019 2:55:30 PM By: Carlene Coria RN Entered By: Carlene Coria on 02/24/2019 08:10:13 -------------------------------------------------------------------------------- Problem List Details Patient Name: Date of Service: [name] 02/24/2019 8:45 AM Medical Record Number:[id number] Patient Account Number: [id number] Date of Birth/Sex: Treating RN: 05-Jun-1970 (48 y.o. [name]: [name] Other Clinician: Referring Provider: Treating Provider/Extender:Stone III, Lennox Grumbles, [name] in Treatment: 24 Active Problems ICD-10 Evaluated Encounter Code Description Active  Date Today Diagnosis M86.671 Other chronic osteomyelitis, right ankle and foot 09/03/2018 No Yes L97.514 Non-pressure chronic ulcer of other part of right foot 09/03/2018 No Yes with necrosis of bone E10.621 Type 1 diabetes mellitus with foot ulcer 09/24/2018 No Yes L02.415 Cutaneous abscess of right lower limb 12/25/2018 No Yes L97.812 Non-pressure chronic ulcer of other part of right lower 02/24/2019 No Yes leg with fat layer exposed Inactive Problems Resolved Problems Electronic Signature(s) Signed: 02/24/2019 10:29:27 PM By: Worthy Keeler PA-C Entered By: Worthy Keeler  on 02/24/2019 08:11:30 -------------------------------------------------------------------------------- Progress Note Details Patient Name: Date of Service: JERMISHA, HOFFART 02/24/2019 8:45 AM Medical Record Number:[id number] Patient Account Number: [id number] Date of Birth/Sex: Treating RN: 1971/01/21 (48 y.o. F) Primary Care Provider: [name] Other Clinician: Referring Provider: Treating Provider/Extender:Stone III, Lennox Grumbles, [name] in Treatment: 24 Subjective Chief Complaint Information obtained from Patient Significant right foot ulcer with right leg ulcers History of Present Illness (HPI) 48 year old diabetic who is known to have type 1 diabetes which is poorly controlled last hemoglobin A1c was 11%. She comes in with a ulcerated area on the left lateral foot which has been there for over 6 months. Was recently she has been treated by [name] of podiatry who saw her last on 05/28/2016. Review of his notes revealed that the patient had incision and drainage with placement of antibiotic beads to the left foot on 04/11/2016 for possible osteomyelitis of the cuboid bone. Over the last year she's had a history of amputation of the left fifth toe and a femoropopliteal popliteal bypass graft somewhere in April 2017. 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 His note [name]  mentions that the patient has been referred to me for further wound care and possibly great candidate for hyperbaric oxygen therapy due to recurrent osteomyelitis. However we do not have any x-rays of biopsy reports confirming this. He has been on several antibiotics including Bactrim and most recently is on doxycycline for an MRSA. I understand, the patient was not a candidate for IV antibiotics as she has had previous PICC lines which resulted in blood clots in both arms. There was a x-ray report dated 04/04/2016 on [name] notes which showed evidence of fifth ray resection left foot with osteolytic changes noted to the fourth metatarsal and cuboid bone on the left. 06/13/2016 -- had a left foot x-ray which showed no acute fracture or dislocation and no definite radiographic evidence of osteomyelitis. Advanced osteopenia was seen. 06/20/2016 -- she has noticed a new wound on the right plantar foot in the region where she had a callus before. 06/27/16- the patient did have her x-ray of the right foot which showed no findings to suggest osteomyelitis. She saw her endocrinologist, [name], yesterday. Her A1c in January was 11. He also indicates mismanagement and noncompliance regarding her diabetes. She is currently on Bactrim for a lip infection. She is complaining of nausea, vomiting and diarrhea. She is unable to articulate the exact orders or dosing of the Bactrim; it is unclear when she will complete this. 07/04/2016 -- results from [facility] of ABIs with ankle waveforms were noted from 02/14/2016. The examination done on 06/27/2015 showed noncompressible ABIs with the right being 1.45 and the left being 1.33. The present examination showed a right ABI of 1.19 on the left of 1.33. The conclusion was that right normal ABI in the lower extremity at rest however compared to previous study which was noncompressible ABI may be falsely elevated side suggesting medial calcification. The left  ABI suggested medial calcification. 08/01/2016 -- the patient had more redness and pain on her right foot and did not get to come to see as noted she see her PCP or go to the ER and decided to take some leftover metronidazole which she had at home. As usual, the patient does report she feels and is rather noncompliant. 08/08/2016 -- -- x-ray of the right foot -- FINDINGS:Transmetatarsal amputation is noted. No bony destruction is noted to suggest osteomyelitis. IMPRESSION: No evidence  of osteomyelitis. Postsurgical changes are seen. MRI would be more sensitive for possible bony changes. Culture has grown Serratia Marcescens -- sensitive to Bactrim, ciprofloxacin, ceftazidime she was seen by [name] on 08/06/2016. He did not find any exposed bone, muscle, tendon, ligament or joint. There was no malodor and he did a excisional debridement in the office. ============ Old notes: 48 year old patient who is known to the wound clinic for a while had been away from the wound clinic since 09/01/2014. Over the last several months she has been admitted to various hospitals including [place] at [facility]. She was treated for a right metatarsal osteomyelitis with a transmetatarsal amputation and this was done about 2 months ago. He has a small ulcerated area on the right heel and she continues to have an ulcerated area on the left plantar aspect of the foot. The patient was recently admitted to the [facility] hospital group between 7/12 and 10/18/2014. she was given 3 weeks of IV vancomycin and was to follow-up with her surgeons at [facility] and also took oral vancomycin for C. difficile colitis. 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 MRI of the left foot done on 10/14/2014 showed no abscess or osteomyelitis. 04/27/15; this is a patient we know from previous stays in the wound care center. She is a type I diabetic I am not sure of her control currently. Since the last time I saw her she is had a right transmetatarsal amputation and has no wounds on her right foot and has no open wounds. She is been followed at the wound care center at [facility] in [place]. She comes today with the desire to undergo hyperbaric treatment locally. Apparently one of her wound care providers in [place] has suggested hyperbarics. This is in response to an MRI from 04/18/15 that showed increased marrow signal and loss of the proximal fifth metatarsal cortex evidence of osteomyelitis with likely early osteomyelitis in the cuboid bone as well. She has a large wound over the base of the fifth metatarsal. She also has a eschar over her the tips of her toes on 1,3 and 5. She does not have peripheral pulses and apparently is going for an angiogram tomorrow which seems reasonable. After this she is going to infectious disease at [facility]. They have been using Medihoney to the large wound on the lateral aspect of the left foot to. The patient has known Charcot deformity from diabetic neuropathy. She also has known diabetic PAD. Surprisingly I can't see that she has had any recent antibiotics, the patient states the last antibiotic she had was at the end of November for 10 days. I think this was in response to culture that showed group G strep although I'm not exactly sure where the culture was from. She is also had arterial studies on 03/29/15. This showed a right ABI of 1.4 that was noncompressible. Her left ABI was 0.73. 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 It was not felt that arterial inflow was adequate for healing of a foot ulcer. Her Doppler waveforms looked  monophasic ===== READMISSION 02/28/17; this is in an now 48 year old woman we've had at several different occasions in this clinic. 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  She has a remote ex-smoker. She was last seen in this clinic by Dr. Con Memos I think in May. More recently she is been followed by her podiatrist [name] an infectious disease Dr. Megan Salon. She has 2 open wounds the major one is over the right first metatarsal head she also has a wound on the left plantar foot. an MRI of the right foot on 01/01/17 showed a soft tissue ulcer along the plantar aspect of the first metatarsal base consistent with osteomyelitis of the first metatarsal stump. Dr. 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 According to the patient this is been open for slightly over a month. She has been on a combination of Cipro 500 twice a day, Zyvox 600 twice a day and Flagyl 500 3 times a day for over a month now as directed by Dr. Megan Salon. cultures of the right foot earlier this year showed MRSA in January and Serratia in May. January also had a few viridans strep. Recent x-rays of both feet were done and [name] office and I don't have these reports. The patient has known PAD and has a history of aleft femoropopliteal bypass in April 2017. 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 Dwyane Dee. In spite of this the last hemoglobin A1c I can see is 10.1 on 01/01/2017. She is being referred by 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 She is been using Medihoney to this area and also an area on the left midfoot. She is using healing sandals bilaterally. ABIs  in this clinic at the left posterior tibial was 1.1 noncompressible on the right READMISSION Non invasive vascular NOVANT 5/18 Aftercare following surgery of the circulatory system Procedure Note - Interface, External Ris In - 08/13/2016 11:05 AM EDT Procedure: Examination consists of physiologic resting arterial pressures of the brachial and ankle arteries bilaterally with continuous wave Doppler waveform analysis. Previous: Previous exam performed on 02/14/16 demonstrated ABIs of Rt = 1.19 and Lt = 1.33. Right: ABI = non-compressible PT, 1.47 DP. S/P transmet amputation. Left: ABI = 1.52, 2nd digit pressure = 87 mmHg Conclusions: Right: ABI (>1.3) may be falsely elevated, suggesting medial calcification. Left: ABI (>1.3) may be falsely elevated, suggesting medial calcification The patient is a now 48 year old type I diabetic is had multiple issues her graded to chronic diabetic foot ulcers. 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 We had her in the clinic lastin November. At that point she had wounds on her bilateral feet.she had wanted to try hyperbarics however the healogics review process denied her because she hadn't followed up with her vascular surgeon for her left femoropopliteal bypass. The bypass was done by Dr. Raul Del at [facility]. We made her a follow-up with Dr. Raul Del however she did not keep the appointment and therefore she was not approved The patient shows me a small wound on her left fourth metatarsal head on her phone. She developed rapid discoloration in the plantar aspect of the left foot and she was admitted to hospital from 2/2 through 05/10/17 with wet gangrene of the left foot osteomyelitis of the fourth metatarsal heads. She was admitted acutely ill with a temperature of 103.  She was started on broad-spectrum vancomycin and cefepime. On 05/06/17 she was taken to the OR by [name]  her podiatric surgeon for an incision and drainage irrigation of the left foot wound. Cultures from this surgery revealed group be strep and anaerobes. she was seen by Dr.Xu of orthopedic surgery and scheduled for a below-knee amputation which she u refused. Ultimately she was discharged on Levaquin and Flagyl for one month. MRI 05/05/17 done while she was in the hospital showed abscess adjacent to the fourth metatarsal head and neck small abscess around the fourth flexor tendon. Inflammatory phlegmon and gas in the soft tissues along the lateral aspect of the fourth phalanx. Findings worrisome for osteomyelitis involving the fourth proximal and middle phalanx and also the third and fourth metatarsals. Finally the patient had actually shortly before this followed up with Dr. Raul Del at no time on 04/29/17. He felt that her left femoropopliteal bypass was patent he felt that her left-sided toe pressures more than adequate for healing a wound on the left foot. This was before her acute presentation. 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 05/28/17; she is started hyperbarics. The patient tells me that for some reason she was not actually on Levaquin but I think on ciprofloxacin. She was on Flagyl. She only started her Levaquin yesterday due to some difficulty with the pharmacy and perhaps her sister picking it up. She has an appointment with [name] tomorrow and with infectious disease early next week. She has no new complaints 06/06/17; the patient continues in hyperbarics. She saw [name] on 05/29/17 who is her podiatric surgeon. He is elected for a transmetatarsal amputation on 06/27/17. I'm not sure at what level he plans to do this amputation. The patient is unaware ooShe also saw Dr. Megan Salon of infectious disease who elected to continue her on current antibiotics I think this is ciprofloxacin and Flagyl. I'll need to clarify with her tomorrow if she actually has this. We're using silver  alginate to the actual wound. Necrotic surface today with material under the flap of her foot. ooOriginal MRI showed abscesses as well as osteomyelitis of the proximal and middle fourth phalanx and the third and fourth metatarsal heads 06/11/17; patient continues in hyperbarics and continues on oral antibiotics. She is doing well. The wound looks better. The necrotic part of this under the flap in her superior foot also looks better. she is been to see [name]. I haven't had a chance to look at his note. Apparently he has put the transmetatarsal amputation on hold her request it is still planning to take her to the OR for debridement and product application ACEL. I'll see if I can find his note. I'll therefore leave product ordering/requests to [name] for now. I was going to look at Dermagraft 06/18/17-she is here in follow-up evaluation for bilateral foot wounds. She continues with hyperbaric therapy. She states she has been applying manuka honey to the right plantar foot and alternate manuka honey and silver alginate to the left foot, despite our orders. We will continue with same treatment plan and she will follo up next week. 06/25/17; I have reviewed [name] last note from 3/11. She has operative debridement in 2 days' time. By review his note apparently they're going to place there is skin over the majority of this wound which is a good choice. She has a small satellite area at the most proximal part of this wound on the left plantar foot. The area on the right  plantar foot we've been using silver alginate and it is close to healing. 07/02/17; unfortunately the patient was not easily approved for [name] proposed surgery. I'm not completely certain what the issue is. She has been using silver alginate to the wound she has completed a first course of hyperbarics. She is still on Levaquin and Flagyl. I have really lost track of the time course here.I suspect she should have another week to  2 of antibiotics. I'll need to see if she is followed up with infectious disease Dr. Megan Salon 07/09/17; the patient is followed up with Dr. Megan Salon. 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 She continues on Levaquin and metronidazole continuing both of these for now I think she is been on fr about 6 weeks. She still has some drainage but no pain. No fever. Her had been plans for her to go to the OR for operative debridement with her podiatrist [name], I am not exactly sure where that is. I'll probably slip a note to [name] today. I note that she follows with Dr. Dwyane Dee of endocrinology. We have her recertified for hyperbaric oxygen. I have not heard about Dermagraft however I'll see if [name] is planning a skin substitute as well 07/16/17; the patient tells me she is just about out of [place]. I'll need to check Dr. Hale Bogus last notes on this. She states she has plenty of Flagyl however. She comes in today complaining of pain in the right lateral foot which she said lasted for about a day. The wound on the right foot is actually much more medially. She also tells me that the [facility] cost a lot of pain in the left foot wound and she turned back to silver alginate. Finally Dermagraft has a $539 per application co-pay. She cannot afford this 07/23/17; patient arrives today with the wound not much smaller. There is not much new to add. She has not heard from [name] all try to put in a call to them today. She was asking about Dermagraft again and she has an over $767 per application co-pay she states that she would be willing to try to do a payment plan. I been tried to avoid this. We've been using silver alginate, I'll change to [facility] 07/30/17-She is here in follow-up evaluation for left foot ulcer. She continues hyperbaric medicine. The left foot ulcer is stable we will continue with same treatment plan 08/06/17; she is here for  evaluation of her left foot ulcer. Currently being treated for hyperbarics or underlying osteomyelitis. She is completed antibiotics. The left foot ulcer is better smaller with healthier looking granulation. For various reasons I am not really clear on we never got her back to the OR with [name]. He did not respond to my secure text message. Nevertheless I think that surgery on this point is not necessary nor am I completely clear that a skin substitute is necessary The patient is complaining about pain on the outside of her right foot. 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 There is no erythema. She also states the foot is warm versus her other part of her upper leg and this is largely true. It is not totally clear to me what's causing this. She thinks it's different from her usual neuropathy pain 08/13/17; she arrives in clinic today with a small wound which is superficial on her right first metatarsal head. 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 She tells [place] she was up on her feet over the Mother's Day celebration. ooThe large wound is on the left foot. Continues with hyperbarics for underlying osteomyelitis. We're using Hydrofera Blue. She asked me today about where we were with Dermagraft. I had actually excluded this because of the co- pay however she wants to assume this therefore I'll recheck the co-pay an order for next week. 08/20/17; the patient agreed to accept the co-pay of the first Apligraf which we applied today. She is disappointed she is finishing hyperbarics will run this through the insurance on the extent of the foot infection and the extent of the wound that she had however she is already had 60 dive's. Dermagraft No. 1 08/27/17; Dermagraft No. 2. She is not eligible for any more hyperbaric treatments this month. She reports a fair amount of drainage and she actually changed to the external dressings without disturbing the direct  contact layer 09/03/17; the patient arrived in clinic today with the wound superficially looking quite healthy. Nice vibrant red tissue with some advancing epithelialization although not as much adherence of the flap as I might like. However she noted on her own fourth toe some bogginess and she brought that to our attention. Indeed this was boggy feeling like a possibility of subcutaneous fluid. 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 She is not been unwell. We've been using Dermagraft 09/10/17; the culture that I did not last week was MRSA. She saw Dr. Megan Salon this morning who is going to start her on vancomycin. I had sent him a secure a text message yesterday. I also spoke with her podiatric surgeon [name] about surgery on this foot the options for conserving a functional foot etc. Promised me he would see her and will make back consultation today. Paradoxically her actual wound on the plantar aspect of her left foot looks really quite good. I had given her 5 days worth of Baxdella to cover her for MRSA. Her MRI came back showing osteomyelitis within the third metatarsal shaft and head and base of the third and fourth proximal phalanx. She had extensive inflammatory changes throughout the soft tissue of the lateral forefoot. With an ill-defined fluid around the fourth metatarsal extending into the plantar and dorsal soft tissues 09/19/17; the patient is actually on oral Septra and Flagyl. She apparently refused IV vancomycin. She also saw [name] at my request who is planning her for a left BKA sometime in mid July. MRI showed osteomyelitis within the third metatarsal shaft and head and the basis of the third and fourth proximal phalanx. I believe there was felt to be possible septic arthritis involving the third MTP. 09/26/17; the patient went back to Dr. Megan Salon at my suggestion and is now receiving IV daptomycin.  Her wound continues to look quite good making the decision to proceed with a transmetatarsal amputation although more difficult for the patient. I believe in my extensive discussions with her she has a good sense of the pros and cons of this. I don't NV the tuft decision she has to make. She has an appointment with [name] I believe in mid July and I previously spoken to him about this issue Has we had used 3 previous Dermagraft. Given the condition of the wound surface I went ahead and added the fourth one today area and I did this not fully realizing that she'll  be traveling to [place] next week. I'm hopeful she can come back in 2 weeks 10/21/17; Her same Dermagraft on for about 3-1/2 weeks. In spite of this the wound arrives looking quite healthy. There is been a lot of healing dimensions are smaller. Looking at the square shaped wound she has now there is some undermining and some depth medially under the undermining although I cannot palpate any bone. No surrounding infection is obvious. She has difficult questions about how to look at this going forward vis--vis amputations versus continued medical therapy. To be truthful the wound is looks so healthy and it is continued to contract. Hard to justify foot surgery at this point although I still told her that I think it might come to that if we are not able to eradicate the underlying MRSA. She is still highly at risk and she understands this 11/06/17 on evaluation today patient appears to be doing better in regard to her foot ulcer. She's been tolerating the dressing changes without complication. Currently she is here for her Dermagraft #6. Her wound continues to make excellent progress at this point. She does not appear to have any evidence of infection which is good news. 11/13/17 on evaluation today patient appears to be doing excellent at this time. She is here for repeat Dermagraft application. This is #7. Overall her wound seems to be  making great progress. 12/05/17; the patient arrives with the wound in much better condition than when I last saw this almost 6 weeks ago. She still has a small probing area in the left metatarsal head region on the lateral aspect of her foot. We applied her last Dermagraft today. ooSince the last time she is here she has what appears to a been a blood blister on the plantar aspect of left foot although I don't see this is threatening. There is also a thick raised tissue on the right mid metatarsal head region. This was not there I don't think the last time she was here 3 weeks ago. 12/12/17; the patient continues to have a small programming area in the left metatarsal head region on the lateral aspect of her foot which was the initial large surgical wound. I applied her last Apligraf last week. I'm going to use Endoform starting today ooUnfortunately she has an excoriated area in the left mid foot and the right mid foot. The left midfoot looks like a blistered area this was not opened last week it certainly is open today. Using silver alginate on these areas. She promises me she is offloading this. 12/19/17; the small probing area in the left metatarsal head eyes think is shallower. In general her original wound looks better. We've been using Endoform. The area inferiorly that I think was trauma last week still requires debridement a lot of nonviable surface which I removed. She still has an open open area distally in her foot ooSimilarly on the right foot there is tightly adherent surface debris which I removed. Still areas that don't look completely epithelialized. This is a small open area. We used silver alginate on these areas 12/26/2017; the patient did not have the supplies we ordered from last week including the Endoform. The original large wound on the left lateral foot looks healthy. She still has the undermining area that is largely unchanged from last week. She has the same heavily  callused raised edged wounds on the right mid and left midfoot. Both of these requiring debridement. We have been using silver alginate on these areas  01/02/2018; there is still supply issues. We are going to try to use Prisma but I am not sure she actually got it from what she is saying. She has a new open area on the lateral aspect of the left fourth toe [previous fifth ray amputation]. Still the one tunneling area over the fourth metatarsal head. The area is in the midfoot bilaterally still have thick callus around them. She is concerned about a raised swelling on the lateral aspect of the foot. However she is completely insensate 01/10/2018; we are using Prisma to the wounds on her bilateral feet. Surprisingly the tunneling area over the left fourth metatarsal head that was part of her original surgery has closed down. She has a small open area remaining on the incision line. 2 open areas in the midfoot. 02/10/2018; the patient arrives back in clinic after a month hiatus. She was traveling to visit family in [place]. Is fairly clear she was not offloading the areas on her feet. The original wound over the left lateral foot at the level of metatarsal heads is reopened and probes medially by about a centimeter or 2. She notes that a week ago she had purulent drainage come out of an area on the left midfoot. Paradoxically the worst area is actually on the right foot is extensive with purulent drainage. We will use silver alginate today 02/17/2018; the patient has 3 wounds one over the left lateral foot. She still has a small area over the metatarsal heads which is the remnant of her original surgical wound. This has medial probing depth of roughly 1.4 cm somewhat better than last week. The area on the right foot is larger. We have been using silver alginate to all areas. The area on the right foot and left foot that we cultured last week showed both Klebsiella and Proteus. Both of these are  quinolone sensitive. The patient put her's self on Bactrim and Flagyl that she had left hanging around from prior antibiotic usages. She was apparently on this last week when she arrived. I did not realize this. Unfortunately the Bactrim will not cover either 1 of these organisms. We will send in Cipro 500 twice daily for a week 03/04/2018; the patient has 2 wounds on the left foot one is the original wound which was a surgical wound for a deep DFU. At one point this had exposed bone. She still has an area over the fourth metatarsal head that probes about 1.4 cm although I think this is better than last week. I been using silver nitrate to try and promote tissue adherence and been using silver alginate here. ooShe also has an area in the left midfoot. This has some depth but a small linear wound. Still requiring debridement. ooOn the right midfoot is a circular wound. A lot of thick callus around this area. ooWe have been using silver alginate to all wound areas ooShe is completed the ciprofloxacin I gave her 2 weeks ago. 03/11/2018; the patient continues to have 2 open areas on the left foot 1 of which was the original surgical wound for a deep DFU. Only a small probing area remains although this is not much different from last week we have been using silver alginate. The other area is on the midfoot this is smaller linear but still with some depth. We have been using silver alginate here as well ooOn the right foot she has a small circular wound in the mid aspect. This is not much smaller than last  time. We have been using silver alginate here as well 03/18/2018; she has 3 wounds on the left foot the original surgical wound, a very superficial wound in the mid aspect and then finally the area in the mid plantar foot. She arrives in today with a very concerning area in the wound in the mid plantar foot which is her most proximal wound. There is undermining here of roughly 1-1/2  cm superiorly. Serosanguineous drainage. She tells me she had some pain on for over the weekend that shot up her foot into her thigh and she tells me that she had a nodule in the groin area. ooShe has the single wound in the right foot. ooWe are using endoform to both wound areas 03/24/2018; the patient arrives with the original surgical wound in the area on the left midfoot about the same as last week. There is a collection of fluid under the surface of the skin extending from the surgical wound towards the midfoot although it does not reach the midfoot wound. The area on the right foot is about the same. Cultures from last week of the left midfoot wound showed abundant Klebsiella abundant Enterococcus faecalis and moderate methicillin resistant staph I gave her Levaquin but this would have only covered the Klebsiella. She will need linezolid 04/01/2018; she is taking linezolid but for the first few days only took 1 a day. I have advised her to finish this at twice daily dosing. In any case all of her wounds are a lot better especially on the left foot. The original surgical wound is closed. The area on the left midfoot considerably smaller. The area on the right foot also smaller. 04/08/2018; her original surgical wound/osteomyelitis on the left foot remains closed. She has area on the left foot that is in the midfoot area but she had some streaking towards this. This is not connected with her original wound at least not visually. ooSmall wound on the right midfoot appears somewhat smaller. 04/15/18; both wounds looks better. Original wound is better left midfoot. Using silver alginate 1/21; patient states she uses saltwater soak in, stones or remove callus from around her wounds. She is also concerned about a blood blister she had on the left foot but it simply resolved on its own. We've been using silver alginate 1/28; the patient arrives today with the same streaking area from her  metatarsals laterally [the site of her original surgical wound] down to the middle of her foot. There is some drainage in the subcutaneous area here. This concerns me that there is actually continued ongoing infection in the metatarsals probably the fourth and third. This fixates an MRI of the foot without contrast [chronic renal failure] ooThe wound in the mid part of the foot is small but I wonder whether this area actually connects with the more distal foot. ooThe area on the right midfoot is probably about the same. Callus thick skin around the small wound which I removed with a curette we have been using silver alginate on both wound areas 2/4; culture I did of the draining site on the left foot last time grew methicillin sensitive staph aureus. MRI of the left foot showed interval resolution of the findings surrounding the third metatarsal joint on the prior study consistent with treated osteomyelitis. Chronic soft tissue ulceration in the plantar and lateral aspect of the forefoot without residual focal fluid collection. No evidence of recurrent osteomyelitis. Noted to have the previous amputation of the distal first phalanx and fifth  ray MRI of the right foot showed no evidence of osteomyelitis I am going to treat the patient with a prolonged course of antibiotics directed against MSSA in the left foot 2/11; patient continues on cephalexin. She tells me she had nausea and vomiting over the weekend and missed 2 days. In general her foot looks much the same. She has a small open area just below the left fourth metatarsal head. A linear area in the left midfoot. Some discoloration extending from the inferior part of this into the left lateral foot although this appears to be superficial. She has a small area on the right midfoot which generally looks smaller after debridement 2/18; the patient is completing his cephalexin and has another 2 days. She continues to have open areas on the  left and right foot. 2/25; she is now off antibiotics. The area on the left foot at the site of her original surgical wound has closed yet again. She still has open areas in the mid part of her foot however these appear smaller. The area on the right mid foot looks about the same. We have been using silver alginate She tells me she had a serious hypoglycemic spell at home. She had to have EMS called and get IV dextrose 3/3; disappointing on the left lateral foot large area of necrotic tissue surrounding the linear area. This appears to track up towards the same original surgical wound. Required extensive debridement. The area on the right plantar foot is not a lot better also using silver 3/12; the culture I did last time showed abundant enterococcus. I have prescribed Augmentin, should cover any unrecognized anaerobes as well. In addition there were a few MRSA and Serratia that would not be well covered although I did not want to give her multiple antibiotics. She comes in today with a new wound in the right midfoot this is not connected with the original wound over her MTP a lot of thick callus tissue around both wounds but once again she said she is not walking on these areas 3/17-Patient comes in for follow-up on the bilateral plantar wounds, the right midfoot and the left plantar wound. Both these are heavily callused surrounding the wounds. We are continuing to use silver alginate, she is compliant with offloading and states she uses a wheelchair fairly often at home 3/24; both wound areas have thick callus. However things actually look quite a bit better here for the majority of her left foot and the right foot. 3/31; patient continues to have thick callused somewhat irritated looking tissue around the wounds which individually are fairly superficial. There is no evidence of surrounding infection. We have been using silver alginate however I change that to [facility] today 4/17;  patient returns to clinic after having a scare with Covid she tested negative in her primary doctor's office. She has been using Hydrofera Blue. She does not have an open area on the right foot. On the left foot she has a small open area with the mid area not completely viable. She showed me pictures of what looks like a hemorrhagic blister from several days ago but that seems to have healed over this was on the lateral left foot 4/21; patient comes in to clinic with both her wounds on her feet closed. However over the weekend she started having pain in her right foot and leg up into the thigh. She felt as though she was running a low-grade fever but did not take her temperature. She took  a doxycycline that she had leftover and yesterday a single Septra and metronidazole. She thinks things feel somewhat better. 4/28; duplex ultrasound I ordered last week was negative for DVT or superficial thrombophlebitis. She is completed the doxycycline I gave her. States she is still having a lot of pain in the right calf and right ankle which is no better than last week. She cannot sleep. She also states she has a temperature of up to 101, coughing and complaining of visual loss in her bilateral eyes. Apparently she was tested for Covid 2 weeks ago at [facility] and that was negative. Readmission: 09/03/18 patient presents back for reevaluation after having been evaluated at the end of April regarding erythema and swelling of her right lower extremity. Subsequently she ended up going to the hospital on 07/29/18 and was admitted not to be discharged until 08/08/18. Unfortunately it was noted during the time that she was in the hospital that she did have methicillin-resistant Staphylococcus aureus as the infection noted at the site. It was also determined that she did have osteomyelitis which appears to be fairly significant. She was treated with vancomycin and in fact is still on IV vancomycin at dialysis currently.  This is actually slated to continue until 09/12/18 at least which will be the completion of the six weeks of therapy. Nonetheless based on what I'm seeing at this point I'm not sure she will be anywhere near ready to discontinue antibiotics at that time. Since she was released from the hospital she was seen by [name] who is her podiatrist on 08/27/18. His note specifically states that he is recommended that the patient needs of one knee amputation on the right as she has a life-threatening situation that can lead quickly to sepsis. The patient advised she would like to try to save her leg to which [name] apparently told her that this was against all medical advice. She also want to discontinue the Wound VAC which had been initiated due to the fact that she wasn't pleased with how the wound was looking and subsequently she wanted to pursue applying Medihoney at that time. He stated that he did not believe that the right lower extremity was salvageable and that the patient understood but would still like to attempt hyperbaric option therapy if it could be of any benefit. She was therefore referred back to [place] for further evaluation. He plans to see her back next week. Upon inspection today patient has a significant amount purulent drainage noted from the wound at this point. The bone in the distal portion of her foot also appears to be extremely necrotic and spongy. When I push down on the bone it bubbles and seeps purulent drainage from deeper in the end of the foot. I do not think that this is likely going to heal very well at all and less aggressive surgical debridement were undertaken more than what I believe we can likely do here in our office. 09/12/2018; I have not seen this patient since the most recent hospitalization although she was in our clinic last week. I have reviewed some of her records from a complex hospitalization. 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 There is situation was complicated by MRSA bacteremia and acute on chronic renal failure now on dialysis. She is receiving vancomycin at dialysis. We started her on Dakin's wet-to- dry last week she is changing this daily. There is still purulent drainage coming out of her  foot. Although she is apparently "agreeable" to a below-knee amputation which is been suggested by multiple clinicians she wants this to be done in [place]. She apparently has a telehealth visit with that provider sometime in late [place] 6/24. I have told her I think this is probably too long. Nevertheless I could not convince her to allow a local doctor to perform BKA. 09/19/2018; the patient has a large necrotic area on the right anterior foot. She has had previous transmetatarsal amputations. Culture I did last week showed MRSA nothing else she is on vancomycin at dialysis. She has continued leaking purulent drainage out of the distal part of the large circular wound on the right anterior foot. She apparently went to see Dr. 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 Somehow that translated into her being referred to plastic surgery for debridement of the area. I gather she basically refused amputation although I do not have a copy of Dr. Berenice Primas notes. The patient really wants to have a trial of hyperbaric oxygen. I agreed with initial assessment in this clinic that this was probably too far along to benefit however if she is going to have plastic surgery I think she would benefit from ancillary hyperbaric oxygen. The issue here is that the patient has benefited as maximally as any patient I have ever seen from hyperbaric oxygen therapy. Most recently she had exposed bone on the lateral part of her left foot after a surgical procedure and that actually has closed. She has eschared areas in both heels but no open area. She is remained systemically well. I am not  optimistic that anything can be done about this but the patient is very clear that she wants an attempt. The attempt would include a wound VAC further debridements and hyperbaric oxygen along with IV antibiotics. 6/26; I put her in for a trial of hyperbaric oxygen only because of the dramatic response she has had with wounds on her left midfoot earlier this year which was a surgical wound that went straight to her bone over the metatarsal heads and also remotely the left third toe. We will see if we can get this through our review process and insurance. She arrives in clinic with again purulent material pouring out of necrotic bone on the top of the foot distally. There is also some concerning erythema on the front of the leg that we marked. It is bit difficult to tell how tender this is because of neuropathy. I note from infectious disease that she had her vancomycin extended. 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 She had the wound VAC on for part of the week. The rest of the time she is putting various things on this including Medihoney, "ionized water" silver sorb gel etc. 7/7; follow-up along with HBO. She is still on vancomycin at dialysis. She has a large open area on the dorsal right foot and a small dark eschar area on her heel. There is a lot less erythema in the area and a lot less tenderness. From an infection point of view I think this is better. She still has a lot of necrosis in the remaining right forefoot [previous TMA] we are still using the wound VAC in this area 7/16; follow-up along with HBO. I put her on linezolid after she finished her vancomycin. We started this last Friday I gave her 2 weeks worth. I had the expectation that she would be operatively debrided by Dr. Marla Roe  but that still has not happened yet. Patient phoned the office this week. She arrives for review today after HBO. The distal part of this wound is completely  necrotic. Nonviable pieces of tendon bone was still purulent drainage. Also concerning that she has black eschar over the heel that is expanding. I think this may be indicative of infection in this area as well. She has less erythema and warmth in the ankle and calf but still an abnormal exam 7/21 follow-up along with HBO. I will renew her linezolid after checking a CBC with differential monitoring her blood counts especially her platelets. She was supposed to have surgery yesterday but if I am reading things correctly this was canceled after her blood sugar was found to be over 500. I thought Dr. Marla Roe who called me said that they were sending her to the ER but the patient states that was not the case. 7/28. Follow-up along with HBO. She is on linezolid I still do not have any lab work from dialysis even though I called last week. The patient is concerned about an area on her left lateral foot about the level of the base of her fifth metatarsal. I did not really see anything that ominous here however this patient is in [place] ability to point out problems that she is sensing and she has been accurate in the past Finally she received a call from Dr. Marla Roe who is referring her to another orthopedic surgeon stating that she is too booked up to take her to the operating room now. Was still using a wound VAC on the foot 8/3 -Follow-up after HBO, she is got another week of linezolid, she is to call ID for an appointment, x-rays of both feet were reviewed, the left foot x-ray with third MTP joint osteo- Right foot x-ray widespread osteo-in the right midfoot Right ankle x-ray does not show any active evidence of infection 8/11-Patient is seen after HBO, the wounds on the right foot appear to be about the same, the heel wound had some necrotic base over tendon that was debrided with a curette 8/21; patient is seen after HBO. The patient's wound on her dorsal foot actually looks reasonably good  and there is substantial amount of epithelialization however the open area distally still has a lot of necrotic debris partially bone. I cannot really get a good sense of just how deep this probes under the foot. She has been pressuring me this week to order medical maggots through a company in [place] for her. The problem I have is there is not a defined wound area here. On the positive side there is no purulence. She has been to see infectious disease she is still on Septra DS although I have not had a chance to review their notes 8/28; patient is seen in conjunction with HBO. The wounds on her foot continued to improve including the right dorsal foot substantially the, the distal part of this wound and the area on the right heel. We have been using a wound VAC over this chronically. She is still on trimethoprim as directed by infectious disease 9/4; patient is seen in conjunction with HBO. Right dorsal foot wound substantially anteriorly is better however she continues to have a deep wound in the distal part of this that is not responding. We have been using silver collagen under border foam ooArea on the right plantar medial heel seems better. We have been using Hydrofera Blue 12/12/18 on evaluation today patient appears to be  doing about the same with regard to her wound based on prior measurements. She does have some necrotic tissue noted on the lateral aspect of the wound that is going require a little bit of sharp debridement today. This includes what appears to be potentially either severely necrotic bone or tendon. Nonetheless other than that she does not appear to have any severe infection which is good news 9/18; it is been 2 weeks since I saw this wound. She is tolerating HBO well. Continued dramatic improvement in the area on the right dorsal foot. She still has a small wound on the heel that we have been using Hydrofera Blue. She continues with a wound VAC 9/24; patient has to  be seen emergently today with a swelling on her right lateral lower leg. She says that she told Dr. Evette Doffing about this and also myself on a couple of occasions but I really have no recollection of this. She is not systemically unwell and her wound really looked good the last time I saw this. She showed this to providers at dialysis and she was able to verify that she was started on cephalexin today for 5 doses at dialysis. She dialyzes on Tuesday Thursday and Saturday. 10/2; patient is seen in conjunction with HBO. The area that is draining on the right anterior medial tibia is more extensive. Copious amounts of serosanguineous drainage with some purulence. We are still using the wound VAC on the original wound then it is stable. Culture I did of the original IandD showed MRSA I contacted dialysis she is now on vancomycin with dialysis treatments. I asked them to run a month 10/9; patient seen in conjunction with HBO. She had a new spontaneous open area just above the wound on the right medial tibia ankle. More swelling on the right medial tibia. Her wound on the foot looks about the same perhaps slightly better. There is no warmth spreading up her leg but no obvious erythema. her MRI of the foot and ankle and distal tib-fib is not booked for next Friday I discussed this with her in great detail over multiple days. it is likely she has spreading infection upper leg at least involving the distal 25% above the ankle. She knows that if I refer her to orthopedics for infectious disease they are going to recommend amputation and indeed I am not against this myself. We had a good trial at trying to heal the foot which is what she wanted along with antibiotics debridement and HBO however she clearly has spreading infection [probably staph aureus/MRSA]. Nevertheless she once again tells me she wants to wait the left of the MRI. She still makes comments about having her amputation done in  [place]. 10/19; arrives today with significant swelling on the lateral right leg. Last culture I did showed Klebsiella. Multidrug- resistant. Cipro was intermediate sensitivity and that is what I have her on pending her MRI which apparently is going to be done on Thursday this week although this seems to be moving back and forth. She is not systemically unwell. We are using silver alginate on her major wound area on the right medial foot and the draining areas on the right lateral lower leg 10/26; MRI showed extensive abscess in the anterior compartment of the right leg also widespread osteomyelitis involving osseous structures of the midfoot and portions of the hindfoot. Also suspicion for osteomyelitis anterior aspect of the distal medial malleolus. Culture I did of the purulence once again showed a multidrug-resistant Klebsiella. I  have been in contact with nephrology late last week and she has been started on cefepime at dialysis to replace the vancomycin We sent a copy of her MRI report to Dr. Geroge Baseman in [place] who is an orthopedic surgeon. The patient takes great stock in his opinion on this. She says she will go to [place] to have her leg amputated if Dr. Geroge Baseman does not feel there is any salvage options. 11/2; she still is not talk to her orthopedic surgeon in [place]. Apparently he will call her at [phone] this afternoon. The quality of this is she has not allowed me to refer her anywhere. She has been told over and over that she needs this amputated but has not agreed to be referred. She tells me her blood sugar was 600 last night but she has not been febrile. 11/9; she never did got a call from the orthopedic surgeon in [place] therefore that is off the radar. We have arranged to get her see orthopedic surgery at [facility]. She still has a lot of draining purulence coming out of the new abscess in her right leg although that probably came from the osteomyelitis in her  right foot and heel. Meanwhile the original wound on the right foot looks very healthy. Continued improvement. The issue is that the last MRI showed osteomyelitis in her right foot extensively she now has an abscess in the right anterior lower leg. There is nobody in [place] who will offer this woman anything but an amputation and to be honest that is probably what she needs. I think she still wants to talk about limb salvage although at this point I just do not see that. She has completed her vancomycin at dialysis which was for the original staph aureus she is still on cefepime for the more recent Klebsiella. She has had a long course of both of these antibiotics which should have benefited the osteomyelitis on the right foot as well as the abscess. 11/16; apparently [place] elective surgery is shut down because of COVID-19 pandemic. I have reached out to some contacts at [facility] to see if we can get her an orthopedic appointment there. I am concerned about continually leaving this but for the moment everything is static. In fact her original large wound on this foot is closing down. It is the abscess on the right anterior leg that continues to drain purulent serosanguineous material. She is not currently on any antibiotics however she had a prolonged course of vancomycin [1 month] as well as cefepime for a month 02/24/2019 on evaluation today patient appears to be doing better than the last time I saw her. This is not a patient that I typically see. With that being said I am covering for Dr. Dellia Nims this week and again compared to when I last saw her overall the wounds in particular seem to be doing significantly better which is good news. With that being said the patient tells me several disconcerting things. She has not been able to get in to see anyone for potential debridement in regard to her leg wounds although she tells me that she does not think it is necessary any longer because  she is taking care of that herself. She noticed a string coming out of the lower wound on her leg over the last week. The patient states that she subsequently decided that we must of pack something in there and started pulling the string out and as it kept coming and coming she realized this was  likely her tendon. With that being said she continued to remove as much of this as she could. She then I subsequently proceeded to using tubes of antibiotic ointment which she will stick down into the wound and then scored as much as she can until she sees it coming out of the other wound opening. She states that in doing this she is actually made things better and there is less redness and irritation. With regard to her foot wound she does have some necrotic tendon and tissue noted in one small corner but again the actual wound itself seems to be doing better with good granulation in general compared to my last evaluation. Patient History Information obtained from Patient. Family History Cancer - Father, Diabetes - Maternal Grandparents, Stroke - Paternal Grandparents, No family history of Heart Disease, Hereditary Spherocytosis, Hypertension, Kidney Disease, Lung Disease, Seizures, Thyroid Problems, Tuberculosis. Social History Former smoker, Marital Status - Single, Alcohol Use - Moderate, Drug Use - Current History - TCH, Caffeine Use - Moderate - tea. Medical History Eyes Patient has history of Cataracts - mild Denies history of Glaucoma, Optic Neuritis Ear/Nose/Mouth/Throat Patient has history of Chronic sinus problems/congestion - "my nose runs all the time" Denies history of Middle ear problems Hematologic/Lymphatic Patient has history of Anemia Denies history of Hemophilia, Human Immunodeficiency Virus, Lymphedema, Sickle Cell Disease Respiratory Patient has history of Sleep Apnea - doesn't tolorate cpap Denies history of Aspiration, Asthma, Chronic Obstructive Pulmonary Disease (COPD),  Pneumothorax, Tuberculosis Cardiovascular Patient has history of Deep Vein Thrombosis - bil arms and leg leg, Hypertension, Peripheral Arterial Disease Denies history of Angina, Arrhythmia, Congestive Heart Failure, Coronary Artery Disease, Hypotension, Myocardial Infarction, Peripheral Venous Disease, Phlebitis, Vasculitis Gastrointestinal Denies history of Cirrhosis , Colitis, Crohnoos, Hepatitis A, Hepatitis B, Hepatitis C Endocrine Patient has history of Type I Diabetes - 1974 Denies history of Type II Diabetes Genitourinary Denies history of End Stage Renal Disease Immunological Denies history of Lupus Erythematosus, Raynaudoos, Scleroderma Integumentary (Skin) Denies history of History of Burn Musculoskeletal Patient has history of 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 - c- section. - exostectomy left cuboid and 5th metatarsal. - laser eye surgery. - left fem-pop bypass graft. - hemrroidectomy. - left eye cataract removed. Medical And Surgical History Notes Cardiovascular hyperlipidemia Genitourinary CKD Review of Systems (ROS) Constitutional Symptoms (General Health) Denies complaints or symptoms of Fatigue, Fever, Chills, Marked Weight Change. Respiratory Denies complaints or symptoms of Chronic or frequent coughs, Shortness of Breath. Cardiovascular Denies complaints or symptoms of Chest pain. Psychiatric Denies complaints or symptoms of Claustrophobia,  Suicidal. Objective Constitutional Well-nourished and well-hydrated in no acute distress. Vitals Time Taken: 7:54 AM, Height: 67 in, Source: Stated, Weight: 125 lbs, Source: Stated, BMI: 19.6, Temperature: 98.9 F, Pulse: 97 bpm, Respiratory Rate: 18 breaths/min, Blood Pressure: 173/79 mmHg, Capillary Blood Glucose: 590 mg/dl. General Notes: glucose perpt report this am. pt states took 4 units of novolin. has appointment with endocrinologist today Respiratory normal breathing without difficulty. clear to auscultation bilaterally. Cardiovascular regular rate and rhythm with normal S1, S2. Psychiatric this patient is able to make decisions and demonstrates good insight into disease process. Alert  and Oriented x 3. pleasant and cooperative. General Notes: I did actually perform some chemical cauterization of the leg wound at this point in order to help with some hyper granular tissue that she is experiencing at this point. I am hopeful this will help things to somewhat heal. With regard to the foot ulcer I actually did perform debridement of necrotic tendon and debris from the surface of the wound down to good and mostly healthy tissue. Nonetheless she tolerated all this without any complication she has no feeling in the extremity. Integumentary (Hair, Skin) Wound #43 status is Open. Original cause of wound was Gradually Appeared. The wound is located on the Right,Medial Foot. The wound measures 5.1cm length x 3.4cm width x 0.6cm depth; 13.619cm^2 area and 8.171cm^3 volume. There is muscle, tendon, and Fat Layer (Subcutaneous Tissue) Exposed exposed. There is no tunneling or undermining noted. There is a large amount of purulent drainage noted. The wound margin is well defined and not attached to the wound base. There is large (67-100%) red, pink granulation within the wound bed. There is a small (1-33%) amount of necrotic tissue within the wound bed including Adherent Slough and Necrosis  of Muscle. Wound #44 status is Open. Original cause of wound was Gradually Appeared. The wound is located on the Right Calcaneus. The wound measures 0cm length x 0cm width x 0cm depth; 0cm^2 area and 0cm^3 volume. There is no tunneling or undermining noted. There is a none present amount of drainage noted. The wound margin is flat and intact. There is no granulation within the wound bed. There is no necrotic tissue within the wound bed. Wound #46 status is Open. Original cause of wound was Other Lesion. The wound is located on the Right,Lateral Lower Leg. The wound measures 1.2cm length x 1.3cm width x 0.9cm depth; 1.225cm^2 area and 1.103cm^3 volume. There is Fat Layer (Subcutaneous Tissue) Exposed exposed. There is no undermining noted, however, there is tunneling at 1:00 with a maximum distance of 4.2cm. There is a large amount of purulent drainage noted. The wound margin is well defined and not attached to the wound base. There is medium (34-66%) red, pink granulation within the wound bed. There is a medium (34-66%) amount of necrotic tissue within the wound bed including Adherent Slough. Wound #47 status is Open. Original cause of wound was Gradually Appeared. The wound is located on the Right,Anterior Lower Leg. The wound measures 1.2cm length x 1.2cm width x 0.9cm depth; 1.131cm^2 area and 1.018cm^3 volume. There is Fat Layer (Subcutaneous Tissue) Exposed exposed. Tunneling has been noted at 7:00 with a maximum distance of 4.2cm. Undermining begins at 12:00 and ends at 12:00 with a maximum distance of 2.4cm. There is a large amount of purulent drainage noted. The wound margin is distinct with the outline attached to the wound base. There is large (67-100%) pink, hyper - granulation within the wound bed. There is a small (1-33%) amount of necrotic tissue within the wound bed including Adherent Slough. 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 There was a Excisional Skin/Subcutaneous Tissue/Muscle  Debridement with a total area of 1 sq cm performed by Worthy Keeler, PA. With the following instrument(s): Forceps, and Scissors to remove Non-Viable tissue/material. Material removed includes Tendon, Subcutaneous Tissue, and Slough after achieving pain control using Lidocaine 5% topical ointment. No specimens were taken. A time out was conducted at 08:21, prior to the start of the procedure. A Moderate amount of bleeding was controlled with Pressure. The procedure was tolerated well with a pain level of 0 throughout and a pain level of 0 following the procedure. Post Debridement Measurements: 5.1cm length x 3.4cm width x 0.6cm depth; 8.171cm^3 volume. Character of Wound/Ulcer Post Debridement is improved. Severity of Tissue Post Debridement is: Fat layer exposed. Post procedure Diagnosis Wound #43: Same as Pre-Procedure Wound #47 Pre-procedure diagnosis of Wound #47 is an Abscess located on the Right,Anterior Lower Leg . An Chemical Cauterization procedure was performed by Worthy Keeler, PA. Post procedure Diagnosis Wound #47: Same as Pre-Procedure Notes: silver nitrate Plan Follow-up Appointments: Return Appointment in 1 week. Dressing Change Frequency: Wound #43 Right,Medial Foot: Change dressing three times week. Wound #44 Right Calcaneus: Change dressing three times week. Wound #46 Right,Lateral Lower Leg: Change dressing three times week. Wound #47 Right,Anterior Lower Leg: Change  dressing three times week. Wound Cleansing: Clean wound with Wound Cleanser - Anasept to all wounds Primary Wound Dressing: Wound #43 Right,Medial Foot: Calcium Alginate with Silver Wound #44 Right Calcaneus: Calcium Alginate with Silver Wound #46 Right,Lateral Lower Leg: Calcium Alginate with Silver - do not pack, place over wound bed Wound #47 Right,Anterior Lower Leg: Calcium Alginate with Silver Secondary Dressing: Wound #43 Right,Medial Foot: Kerlix/Rolled Gauze ABD pad Wound #44 Right Calcaneus: Kerlix/Rolled Gauze Dry Gauze Heel Cup Wound #46 Right,Lateral Lower Leg: Kerlix/Rolled Gauze ABD pad Wound #47 Right,Anterior Lower Leg: Kerlix/Rolled Gauze ABD pad Edema Control: Elevate legs to the level of the heart or above for 30 minutes daily and/or when sitting, a frequency of: - throughout the day Home Health: [facility] skilled nursing for wound care. - Interim 1. My suggestion at this time is good to be that we actually continue with the current wound care measures for the patient as she seems to be doing as well as can be expected with this. This includes the calcium alginate with silver to all wound locations. 2. I am going to suggest as well that we continue with being nonweightbearing as I feel like that is still the best thing for her as well. She states that she knows this and she is in a wheelchair at all times. 3. With regard to infection risk I think that she is at risk for developing sepsis. Especially considering she keeps having new areas pop up on this leg and again has extensive osteomyelitis in her foot. The reason I mention this is that she asked me specifically if I thought she needed to entertain the idea of amputation. With that being said I explained to her that I do feel like that she is going to come to a critical level where something is going to happen this can require amputation. I am not sure if that would just be slow progression of  wounds or potentially even more rapid onset of sepsis. 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 She voiced understanding but  states that she still wants to keep the leg as long as she can. We will see patient back for reevaluation in 1 week here in the clinic. If anything worsens or changes patient will contact our office for additional recommendations. Electronic Signature(s) Signed: 02/24/2019 10:29:27 PM By: Worthy Keeler PA-C Entered By: Worthy Keeler on 02/24/2019 08:36:36 -------------------------------------------------------------------------------- HxROS Details Patient Name: Date of Service: [name] 02/24/2019 8:45 AM Medical Record Number:[id number] Patient Account Number: [id number] Date of Birth/Sex: Treating RN: [birth year] (48 y.o. F) Primary Care Provider: [name] Other Clinician: Referring Provider: Treating Provider/Extender:Stone III, Lennox Grumbles, [name] in Treatment: 24 Information Obtained From Patient Constitutional Symptoms (General Health) Complaints and Symptoms: Negative for: Fatigue; Fever; Chills; Marked Weight Change Respiratory Complaints and Symptoms: Negative for: Chronic or frequent coughs; Shortness of Breath Medical History: Positive for: Sleep Apnea - doesn't tolorate cpap Negative for: Aspiration; Asthma; Chronic Obstructive Pulmonary Disease (COPD); Pneumothorax; Tuberculosis Cardiovascular Complaints and Symptoms: Negative for: Chest pain Medical History: Positive for: Deep Vein Thrombosis - bil arms and leg leg; Hypertension; Peripheral Arterial Disease Negative for: Angina; Arrhythmia; Congestive Heart Failure; Coronary Artery Disease; Hypotension; Myocardial Infarction; Peripheral Venous Disease;  Phlebitis; Vasculitis Past Medical History Notes: hyperlipidemia Psychiatric Complaints and Symptoms: Negative for: Claustrophobia; Suicidal Medical History: Negative for: Anorexia/bulimia; Confinement Anxiety Eyes Medical History: Positive for: Cataracts - mild Negative for: Glaucoma; Optic Neuritis Ear/Nose/Mouth/Throat Medical History: Positive for: Chronic sinus problems/congestion - "my nose runs all the time" Negative for: Middle ear problems Hematologic/Lymphatic Medical History: Positive for: Anemia Negative for: Hemophilia; Human Immunodeficiency Virus; Lymphedema; Sickle Cell Disease Gastrointestinal Medical History: Negative for: Cirrhosis ; Colitis; Crohns; Hepatitis A; Hepatitis B; Hepatitis C Endocrine Medical History: Positive for: Type I Diabetes - 1975 Negative for: Type II Diabetes Time with diabetes: 19 years Treated with: Insulin Blood sugar tested every day: Yes Tested : 4-7 times per day Genitourinary Medical History: Negative for: End Stage Renal Disease Past Medical History Notes: CKD Immunological Medical History: Negative for: Lupus Erythematosus; Raynauds; Scleroderma Integumentary (Skin) Medical History: Negative for: History of Burn Musculoskeletal Medical History: Positive for: Osteoarthritis - in feet; Osteomyelitis - left foot and right 1st met head Negative for: Gout; Rheumatoid Arthritis Neurologic Medical History: Positive for: Neuropathy - feet; Seizure Disorder - diabetic related Negative for: Dementia; Quadriplegia; Paraplegia Oncologic Medical History: Negative for: Received Chemotherapy; Received Radiation HBO Extended History Items Ear/Nose/Mouth/Throat: Eyes: Chronic sinus Cataracts problems/congestion Immunizations Pneumococcal Vaccine: Received Pneumococcal Vaccination: Yes Implantable Devices Yes Hospitalization / Surgery History Type of Hospitalization/Surgery 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 c-section exostectomy left cuboid and 5th metatarsal laser eye surgery left fem-pop bypass graft hemrroidectomy left eye cataract removed Family and Social History Cancer: Yes - Father; Diabetes: Yes - Maternal Grandparents; Heart Disease: No; Hereditary Spherocytosis: No; Hypertension: No; Kidney Disease: No; Lung Disease: No; Seizures: No; Stroke: Yes - Paternal Grandparents; Thyroid Problems: No; Tuberculosis: No; Former smoker; Marital Status - Single; Alcohol Use: Moderate; Drug Use: Current History - TCH; Caffeine Use: Moderate - tea; Financial Concerns: No; Food, Clothing or Shelter Needs: No; Support System Lacking: No; Transportation Concerns: No Physician Affirmation I have reviewed and agree with the above information. Electronic Signature(s) Signed: 02/24/2019 10:29:27 PM By: Worthy Keeler PA-C Entered By: Worthy Keeler on 02/24/2019 08:34:19 -------------------------------------------------------------------------------- SuperBill Details Patient Name: Date of Service: [name] 02/24/2019 Medical Record Number:[id number] Patient Account Number: [id number] Date of Birth/Sex: Treating RN: [birth year] (48 y.o. F) Primary Care Provider:  MOREIRA, [name] Other Clinician: Referring Provider: Treating Provider/Extender:Stone III, Lennox Grumbles, [name] in Treatment: 24 Diagnosis Coding ICD-10 Codes Code Description [phone] Other chronic osteomyelitis, right ankle and foot L97.514 Non-pressure chronic ulcer of other part of right foot with necrosis of bone E10.621 Type 1 diabetes mellitus with foot ulcer L02.415 Cutaneous abscess of right lower limb L97.812 Non-pressure chronic ulcer of other part of right lower leg with fat layer exposed Facility Procedures CPT4 Code Description: [id number] 11043 - DEB MUSC/FASCIA 20 SQ CM/< ICD-10 Diagnosis Description L97.514 Non-pressure chronic ulcer of other part of right foot  with Modifier: necrosis of Quantity: 1 bone CPT4 Code Description: [id number] - CHEM CAUT GRANULATION TISS ICD-10 Diagnosis Description L41.030 Non-pressure chronic ulcer of other part of right lower leg Modifier: with fat lay Quantity: 1 er exposed Physician Procedures CPT4 Code Description: [id number] - WC PHYS DEBR MUSCLE/FASCIA 20 SQ CM ICD-10 Diagnosis Description L97.514 Non-pressure chronic ulcer of other part of right foot with n Modifier: ecrosis of Quantity: 1 bone CPT4 Code Description: [id number] 15615 - WC PHYS CHEM CAUT GRAN TISSUE ICD-10 Diagnosis Description P79.432 Non-pressure chronic ulcer of other part of right lower leg w Modifier: ith fat lay Quantity: 1 er exposed Electronic Signature(s) Signed: 02/24/2019 10:29:27 PM By: Worthy Keeler PA-C Entered By: Worthy Keeler on 02/24/2019 08:37:07

## 2019-03-11 NOTE — Progress Notes (Signed)
[name], [name] ([id number]) Visit Report for 03/09/2019 Arrival Information Details Patient Name: Date of Service: [name] 03/09/2019 8:45 AM Medical Record Number:[id number] Patient Account Number: [id number] Date of Birth/Sex: Treating RN: June 03, 1970 (48 y.o. F) Dwiggins, Shannon Primary Care Tylor Gambrill: [name] Other Clinician: Referring Anayla Giannetti: Treating Kamilya Wakeman/Extender:[name], [name], [name] in Treatment: 26 Visit Information History Since Last Visit Added or deleted any medications: No Patient Arrived: Wheel Chair Any new allergies or adverse reactions: No Arrival Time: 09:02 Had a fall or experienced change in No activities of daily living that may affect Accompanied By: self risk of falls: Transfer Assistance: None Signs or symptoms of abuse/neglect since last No Patient Identification Verified: Yes visito Secondary Verification Process Completed: Yes Hospitalized since last visit: No Patient Requires Transmission-Based No Implantable device outside of the clinic excluding No Precautions: cellular tissue based products placed in the center Patient Has Alerts: No since last visit: Has Dressing in Place as Prescribed: Yes Pain Present Now: No Electronic Signature(s) Signed: 03/11/2019 12:04:56 PM By: [name] Entered By: [name] on 03/09/2019 09:03:26 -------------------------------------------------------------------------------- Encounter Discharge Information Details Patient Name: Date of Service: [name] 03/09/2019 8:45 AM Medical Record Number:[id number] Patient Account Number: [id number] Date of Birth/Sex: Treating RN: 1970/12/07 (48 y.o. [name] Primary Care Jevante Hollibaugh: [name] Other Clinician: Referring Fallon Howerter: Treating Madeeha Costantino/Extender:[name], [name], [name] in Treatment: 26 Encounter Discharge Information Items Post Procedure Vitals Discharge  Condition: Stable Temperature (F): 98.7 Ambulatory Status: Wheelchair Pulse (bpm): 88 Discharge Destination: Home Respiratory Rate (breaths/min): 17 Transportation: Other Blood Pressure (mmHg): 133/67 Accompanied By: self Schedule Follow-up Appointment: Yes Clinical Summary of Care: Patient Declined Electronic Signature(s) Signed: 03/11/2019 12:04:56 PM By: [name] Entered By: [name] on 03/09/2019 09:52:08 -------------------------------------------------------------------------------- Lower Extremity Assessment Details Patient Name: Date of Service: [name] 03/09/2019 8:45 AM Medical Record Number:[id number] Patient Account Number: [id number] Date of Birth/Sex: Treating RN: 1971/01/25 (48 y.o. [name] Primary Care Markea Ruzich: [name] Other Clinician: Referring Marriah Sanderlin: Treating Kasaundra Fahrney/Extender:[name], [name], [name] in Treatment: 26 Edema Assessment Assessed: [Left: No] [Right: No] Edema: [Left: Ye] [Right: s] Calf Left: Right: Point of Measurement: cm From Medial Instep cm 27.5 cm Ankle Left: Right: Point of Measurement: cm From Medial Instep cm 19 cm Vascular Assessment Pulses: Dorsalis Pedis Palpable: [Right:Yes] Electronic Signature(s) Signed: 03/11/2019 12:04:56 PM By: [name] Entered By: [name] on 03/09/2019 09:05:39 -------------------------------------------------------------------------------- Multi Wound Chart Details Patient Name: Date of Service: [name] 03/09/2019 8:45 AM Medical Record Number:[id number] Patient Account Number: [id number] Date of Birth/Sex: Treating RN: [birth year] (48 y.o. F) Primary Care Heatherly Stenner: [name] Other Clinician: Referring Jasan Doughtie: Treating Trenita Hulme/Extender:[name], [name], [name] in Treatment: 26 Vital Signs Height(in): 67 Capillary Blood 367 Glucose(mg/dl): Weight(lbs): 125 Pulse(bpm): 56 Body Mass  Index(BMI): 20 Blood Pressure(mmHg): 133/67 Temperature(F): 98.7 Respiratory 17 Rate(breaths/min): Photos: [43:No Photos] [44:No Photos] [46:No Photos] Wound Location: [43:Right Foot - Medial] [44:Right Calcaneus] [46:Right Lower Leg - Lateral] Wounding Event: [43:Gradually Appeared] [44:Gradually Appeared] [46:Other Lesion] 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] [44:Lower Extremity] Comorbid History: [43:Cataracts, Chronic sinus Cataracts, Chronic sinus Cataracts, Chronic sinus problems/congestion, Anemia, Sleep Apnea, Deep Anemia, Sleep Apnea, Deep Anemia, Sleep Apnea, Deep Vein Thrombosis, Hypertension, Peripheral Hypertension,  Peripheral Hypertension, Peripheral Arterial Disease, Type I Diabetes, Osteoarthritis, Osteomyelitis, Neuropathy, Osteomyelitis, Neuropathy, Osteomyelitis, Neuropathy, Seizure Disorder] [44:problems/congestion, Vein Thrombosis, Arterial Disease, Type I  Diabetes, Osteoarthritis, Seizure Disorder] [46:problems/congestion, Vein Thrombosis, Arterial Disease, Type I Diabetes, Osteoarthritis, Seizure  Disorder] Date Acquired: [43:08/04/2018] [44:08/28/2018] [46:12/25/2018] Weeks of Treatment: [43:26] [44:26] [46:10] Wound Status: [43:Open] [44:Open] [46:Open] Clustered Wound: [43:Yes] [44:No] [46:Yes] Clustered Quantity: [43:1] [44:N/A] [46:2] Measurements L x W x D 4.7x3.8x0.5 [44:0.5x0.7x0.1] [46:0.9x0.7x0.2] (cm) Area (cm) : [43:14.027] [44:0.275] [46:0.495] Volume (cm) : [43:7.014] [44:0.027] [46:0.099] % Reduction in Area: [43:80.70%] [44:89.10%] [46:-526.60%] % Reduction in Volume: 67.80% [44:89.20%] [46:-25.30%] Classification: [43:Grade 4] [44:Grade 2] [46:Full Thickness Without Exposed Support Structures] Exudate Amount: [43:Medium] [44:None Present] [46:Medium] Exudate Type: [43:Serosanguineous] [44:N/A] [46:Serosanguineous] Exudate Color: [43:red, brown] [44:N/A] [46:red, brown] Wound Margin:  [43:Well defined, not attached Flat and Intact] [46:Well defined, not attached] Granulation Amount: [43:Large (67-100%)] [44:None Present (0%)] [46:Medium (34-66%)] Granulation Quality: [43:Red, Pink] [44:N/A] [46:Red, Pink] Necrotic Amount: [43:Small (1-33%)] [44:None Present (0%)] [46:Medium (34-66%)] Exposed Structures: [43:Fat Layer (Subcutaneous Fascia: No Tissue) Exposed: Yes Tendon: Yes Muscle: Yes Fascia: No Joint: No Bone: No] [44:Fat Layer (Subcutaneous Tissue) Exposed: Yes Tissue) Exposed: No Tendon: No Muscle: No Joint: No Bone: No] [46:Fat Layer (Subcutaneous  Fascia: No Tendon: No Muscle: No Joint: No Bone: No] Epithelialization: [43:Small (1-33%)] [44:Large (67-100%)] [46:None] Debridement: [43:Debridement - Excisional N/A] [46:N/A] Pre-procedure [43:09:36] [44:N/A] [46:N/A] Verification/Time Out Taken: Pain Control: [43:Other] [44:N/A N/A] Tissue Debrided: [43:Subcutaneous, Slough] [44:N/A N/A] Level: [43:Skin/Subcutaneous Tissue] [44:N/A N/A] Debridement Area (sq cm):1 [44:N/A N/A] Instrument: [43:Curette] [44:N/A N/A] Bleeding: [43:Minimum] [44:N/A N/A] Hemostasis Achieved: [43:Pressure] [44:N/A N/A] Procedural Pain: [43:0] [44:N/A N/A] Post Procedural Pain: [43:0] [44:N/A N/A] Debridement Treatment Procedure was tolerated [44:N/A N/A] Response: [43:well] Post Debridement [43:4.7x3.8x0.5] [44:N/A N/A] Measurements L x W x D (cm) Post Debridement [43:7.014] [44:N/A N/A] Volume: (cm) Procedures Performed: Debridement [44:N/A N/A 68 N/A] [46:N/A] Photos: [43:No Photos] [44:N/A N/A] Wound Location: [43:Right Lower Leg - Anterior N/A] [44:N/A] Wounding Event: [43:Gradually Appeared] [44:N/A N/A] Primary Etiology: [43:Abscess] [44:N/A N/A] Comorbid History: [43:Cataracts, Chronic sinus N/A problems/congestion, Anemia, Sleep Apnea, Deep Vein Thrombosis, Hypertension, Peripheral Arterial Disease, Type I Diabetes, Osteoarthritis, Osteomyelitis, Neuropathy, Seizure Disorder]  [44:N/A] Date Acquired: [43:01/31/2019] [44:N/A N/A] Weeks of Treatment: [43:5] [44:N/A N/A] Wound Status: [43:Open] [44:N/A N/A] Clustered Wound: [43:No] [44:N/A N/A] Clustered Quantity: [43:N/A] [44:N/A N/A] Measurements L x W x D 1.1x0.7x0.4 [44:N/A N/A] (cm) Area (cm) : [43:0.605] [44:N/A N/A] Volume (cm) : [43:0.242] [44:N/A N/A] % Reduction in Area: [43:-37.50%] [44:N/A N/A] % Reduction in Volume: -450.00% [44:N/A N/A] Classification: [43:Full Thickness Without Exposed Support Structures] [44:N/A N/A] Exudate Amount: [43:Medium] [44:N/A N/A] Exudate Type: [43:Serosanguineous] [44:N/A N/A] Exudate Color: [43:red, brown] [44:N/A N/A] Wound Margin: [43:Distinct, outline attached N/A] [44:N/A] Granulation Amount: [43:Large (67-100%)] [44:N/A N/A] Granulation Quality: [43:Pink, Hyper-granulation] [44:N/A N/A] Necrotic Amount: [43:Small (1-33%)] [44:N/A N/A] Exposed Structures: [43:Fat Layer (Subcutaneous N/A Tissue) Exposed: Yes Fascia: No Tendon: No Muscle: No Joint: No Bone: No] [44:N/A] Epithelialization: [43:None] [44:N/A N/A] Debridement: [43:N/A] [44:N/A N/A] Pain Control: [43:N/A] [44:N/A N/A] Tissue Debrided: [43:N/A] [44:N/A N/A] Level: [43:N/A] [44:N/A N/A] Debridement Area (sq cm):N/A [44:N/A N/A] Instrument: [43:N/A] [44:N/A N/A] Bleeding: [43:N/A] [44:N/A N/A] Hemostasis Achieved: [43:N/A] [44:N/A N/A] Procedural Pain: [43:N/A] [44:N/A N/A] Post Procedural Pain: [43:N/A] [44:N/A N/A] Debridement Treatment N/A [44:N/A N/A] Response: Post Debridement [43:N/A] [44:N/A N/A] Measurements L x W x D (cm) Post Debridement [43:N/A] [44:N/A N/A] Volume: (cm) Procedures Performed: N/A [44:N/A N/A] Treatment Notes Wound #43 (Right, Medial Foot) 1. Cleanse With Wound Cleanser 2. Periwound Care Skin Prep 3. Primary Dressing Applied Calcium Alginate Ag 4. Secondary Dressing ABD Pad Dry [name] Roll [name] 5. Secured With Tape Wound #44 (Right Calcaneus) 1.  Cleanse With Wound Cleanser 2. Periwound Care Skin  Prep 3. Primary Dressing Applied Calcium Alginate Ag 4. Secondary Dressing ABD Pad Dry [name] Roll [name] 5. Secured With Tape Wound #46 (Right, Lateral Lower Leg) 1. Cleanse With Wound Cleanser 2. Periwound Care Skin Prep 3. Primary Dressing Applied Calcium Alginate Ag 4. Secondary Dressing ABD Pad Dry [name] Roll [name] 5. Secured With Tape Wound #47 (Right, Anterior Lower Leg) 1. Cleanse With Wound Cleanser 2. Periwound Care Skin Prep 3. Primary Dressing Applied Calcium Alginate Ag 4. Secondary Dressing ABD Pad Dry [name] Roll [name] 5. Secured With Recruitment consultant) Signed: 03/10/2019 10:01:17 AM By: Linton Ham MD Entered By: Linton Ham on 03/09/2019 10:17:27 -------------------------------------------------------------------------------- Multi-Disciplinary Care Plan Details Patient Name: Date of Service: [name] 03/09/2019 8:45 AM Medical Record Number:[id number] Patient Account Number: [id number] Date of Birth/Sex: Treating RN: 1971-02-19 (48 y.o. Nancy Fetter Primary Care Vickki Igou: [name] Other Clinician: Referring Taesha Goodell: Treating Jeorge Reister/Extender:[name], [name], [name] in Treatment: 26 Active Inactive Wound/Skin Impairment Nursing Diagnoses: Impaired tissue integrity Knowledge deficit related to ulceration/compromised skin integrity Goals: Patient/caregiver will verbalize understanding of skin care regimen Date Initiated: 09/03/2018 Target Resolution Date: 04/03/2019 Goal Status: Active Ulcer/skin breakdown will have a volume reduction of 30% by week 4 Date Initiated: 09/03/2018 Date Inactivated: 10/07/2018 Target Resolution Date: 10/01/2018 Unmet Goal Status: Unmet Reason: COMORBITIES Ulcer/skin breakdown will have a volume reduction of 50% by week 8 Date Initiated: 10/07/2018 Date Inactivated: 11/03/2018 Target Resolution Date:  10/31/2018 Unmet Unmet Goal Status: Unmet Reason: Osteomyelitis Interventions: Assess patient/caregiver ability to obtain necessary supplies Assess patient/caregiver ability to perform ulcer/skin care regimen upon admission and as needed Assess ulceration(s) every visit Provide education on ulcer and skin care Treatment Activities: Skin care regimen initiated : 09/03/2018 Topical wound management initiated : 09/03/2018 Notes: Electronic Signature(s) Signed: 03/09/2019 5:51:44 PM By: [name] RN, BSN Entered By: [name] on 03/09/2019 09:40:02 -------------------------------------------------------------------------------- Pain Assessment Details Patient Name: Date of Service: [name] 03/09/2019 8:45 AM Medical Record Number:[id number] Patient Account Number: [id number] Date of Birth/Sex: Treating RN: 1970/10/16 (48 y.o. [name] Primary Care Jameeka Marcy: [name] Other Clinician: Referring Moksh Loomer: Treating Danil Wedge/Extender:[name], [name], [name] in Treatment: 26 Active Problems Location of Pain Severity and Description of Pain Patient Has Paino No Site Locations Pain Management and Medication Current Pain Management: Electronic Signature(s) Signed: 03/11/2019 12:04:56 PM By: [name] Entered By: [name] on 03/09/2019 09:04:49 -------------------------------------------------------------------------------- Patient/Caregiver Education Details Patient Name: Date of Service: [name] 12/7/2020andnbsp8:45 AM Medical Record Number:[id number] Patient Account Number: [id number] Date of Birth/Gender: Treating RN: September 07, 1970 (48 y.o. Nancy Fetter Primary Care Physician: [name] Other Clinician: Referring Physician: Treating Physician/Extender:[name], [name], [name] in Treatment: 54 Education Assessment Education Provided To: Patient Education Topics Provided Wound/Skin  Impairment: Methods: Explain/Verbal Responses: State content correctly Electronic Signature(s) Signed: 03/09/2019 5:51:44 PM By: [name] RN, BSN Entered By: [name] on 03/09/2019 09:40:22 -------------------------------------------------------------------------------- Wound Assessment Details Patient Name: Date of Service: [name] 03/09/2019 8:45 AM Medical Record Number:[id number] Patient Account Number: [id number] Date of Birth/Sex: Treating RN: [birth year] (48 y.o. [name] Primary Care Nesa Distel: [name] Other Clinician: Referring Jasmine Maceachern: Treating Nikash Mortensen/Extender:[name], [name], [name] in Treatment: 26 Wound Status Wound Number: 43 Primary Diabetic Wound/Ulcer of the Lower Extremity Etiology: Wound Location: Right Foot - Medial Wound Open Wounding Event: Gradually Appeared Status: Date Acquired: 08/04/2018 Comorbid Cataracts, Chronic sinus problems/congestion, Weeks Of Treatment: 26 History: Anemia, Sleep Apnea, Deep Vein Thrombosis, Clustered Wound: Yes Hypertension, Peripheral Arterial Disease, Type  I Diabetes, Osteoarthritis, Osteomyelitis, Neuropathy, Seizure Disorder Photos Wound Measurements Length: (cm) 4.7 Width: (cm) 3.8 Depth: (cm) 0.5 Clustered Quantity: 1 Area: (cm) 14.027 Volume: (cm) 7.014 Wound Description Classification: Grade 4 Wound Margin: Well defined, not attached Exudate Amount: Medium Exudate Type: Serosanguineous Exudate Color: red, brown Wound Bed Granulation Amount: Large (67-100%) Granulation Quality: Red, Pink Necrotic Amount: Small (1-33%) Necrotic Quality: Adherent Slough fter Cleansing: No ino Yes Exposed Structure ed: No ubcutaneous Tissue) Exposed: Yes ed: Yes ed: Yes s of Muscle: Yes d: No : No % Reduction in Area: 80.7% % Reduction in Volume: 67.8% Epithelialization: Small (1-33%) Tunneling: No Undermining: No Foul Odor A Slough/Fibr Fascia Expos Fat  Layer (S Tendon Expos Muscle Expos Necrosi Joint Expose Bone Exposed Treatment Notes Wound #43 (Right, Medial Foot) 1. Cleanse With Wound Cleanser 2. Periwound Care Skin Prep 3. Primary Dressing Applied Calcium Alginate Ag 4. Secondary Dressing ABD Pad Dry [name] Roll [name] 5. Secured With Recruitment consultant) Signed: 03/10/2019 4:34:42 PM By: [name] EMT/HBOT Signed: 03/11/2019 12:04:56 PM By: [name] Entered By: [name] on 03/10/2019 13:48:53 -------------------------------------------------------------------------------- Wound Assessment Details Patient Name: Date of Service: [name] 03/09/2019 8:45 AM Medical Record Number:[id number] Patient Account Number: [id number] Date of Birth/Sex: Treating RN: 01/22/1971 (48 y.o. [name] Primary Care Trystyn Dolley: [name] Other Clinician: Referring Erine Phenix: Treating Kaamil Morefield/Extender:[name], [name], [name] in Treatment: 26 Wound Status Wound Number: 44 Primary Diabetic Wound/Ulcer of the Lower Extremity Etiology: Wound Location: Right Calcaneus Wound Open Wounding Event: Gradually Appeared Status: Date Acquired: 08/28/2018 Comorbid Cataracts, Chronic sinus problems/congestion, Weeks Of Treatment: 26 History: Anemia, Sleep Apnea, Deep Vein Thrombosis, Clustered Wound: No Hypertension, Peripheral Arterial Disease, Type I Diabetes, Osteoarthritis, Osteomyelitis, Neuropathy, Seizure Disorder Photos Wound Measurements Length: (cm) 0.5 % Reduction in Are Width: (cm) 0.7 % Reduction in Vol Depth: (cm) 0.1 Epithelialization: Area: (cm) 0.275 Tunneling: Volume: (cm) 0.027 Undermining: Wound Description Classification: Grade 2 Foul Odor After Cl Wound Margin: Flat and Intact Slough/Fibrino Exudate Amount: None Present Wound Bed Granulation Amount: None Present (0%) Necrotic Amount: None Present (0%) Fascia Exposed: Fat Layer (Subcuta Tendon  Exposed: Muscle Exposed: Joint Exposed: Bone Exposed: eansing: No No Exposed Structure No neous Tissue) Exposed: No No No No No a: 89.1% ume: 89.2% Large (67-100%) No No Treatment Notes Wound #44 (Right Calcaneus) 1. Cleanse With Wound Cleanser 2. Periwound Care Skin Prep 3. Primary Dressing Applied Calcium Alginate Ag 4. Secondary Dressing ABD Pad Dry [name] Roll [name] 5. Secured With Recruitment consultant) Signed: 03/10/2019 4:34:42 PM By: [name] EMT/HBOT Signed: 03/11/2019 12:04:56 PM By: [name] Entered By: [name] on 03/10/2019 13:49:22 -------------------------------------------------------------------------------- Wound Assessment Details Patient Name: Date of Service: [name] 03/09/2019 8:45 AM Medical Record Number:[id number] Patient Account Number: [id number] Date of Birth/Sex: Treating RN: Jul 12, 1970 (48 y.o. [name] Primary Care Yliana Gravois: [name] Other Clinician: Referring Guillermo Difrancesco: Treating Aidyn Sportsman/Extender:[name], [name], [name] in Treatment: 26 Wound Status Wound Number: 46 Primary Abscess Etiology: Wound Location: Right Lower Leg - Lateral Wound Open Wounding Event: Other Lesion Status: Date Acquired: 12/25/2018 Notes: MD IandD today. Weeks Of Treatment: 10 Comorbid Cataracts, Chronic sinus problems/congestion, Clustered Wound: Yes History: Anemia, Sleep Apnea, Deep Vein Thrombosis, Hypertension, Peripheral Arterial Disease, Type I Diabetes, Osteoarthritis, Osteomyelitis, Neuropathy, Seizure Disorder Photos Wound Measurements Length: (cm) 0.9 % Reductio Width: (cm) 0.7 % Reductio Depth: (cm) 0.2 Epitheli Clustered Quantity: 2 Tunnelin Area: (cm) 0.495 Undermi Volume: (cm) 0.099 Wound Description Classification: Full Thickness Without Exposed  Support Foul Odo Structures Slough/F Wound Well defined, not attached Margin: Exudate  Medium Amount: Exudate Serosanguineous Type: Exudate red, brown Color: Wound Bed Granulation Amount: Medium (34-66%) Granulation Quality: Red, Pink Fascia E Necrotic Amount: Medium (34-66%) Fat Laye Necrotic Quality: Adherent Slough Tendon E Muscle E Joint Ex Bone Exp r After Cleansing: No ibrino Yes Exposed Structure xposed: No r (Subcutaneous Tissue) Exposed: Yes xposed: No xposed: No posed: No osed: No n in Area: -526.6% n in Volume: -25.3% alization: None g: No ning: No Treatment Notes Wound #46 (Right, Lateral Lower Leg) 1. Cleanse With Wound Cleanser 2. Periwound Care Skin Prep 3. Primary Dressing Applied Calcium Alginate Ag 4. Secondary Dressing ABD Pad Dry [name] Roll [name] 5. Secured With Recruitment consultant) Signed: 03/10/2019 4:34:42 PM By: [name] EMT/HBOT Signed: 03/11/2019 12:04:56 PM By: [name] Entered By: [name] on 03/10/2019 13:50:17 -------------------------------------------------------------------------------- Wound Assessment Details Patient Name: Date of Service: [name] 03/09/2019 8:45 AM Medical Record Number:[id number] Patient Account Number: [id number] Date of Birth/Sex: Treating RN: 1970/07/08 (48 y.o. [name] Primary Care Erandy Mceachern: [name] Other Clinician: Referring Irish Piech: Treating Clemie General/Extender:[name], [name], [name] in Treatment: 26 Wound Status Wound Number: 47 Primary Abscess Etiology: Wound Location: Right Lower Leg - Anterior Wound Open Wounding Event: Gradually Appeared Status: Date Acquired: 01/31/2019 Comorbid Cataracts, Chronic sinus problems/congestion, Weeks Of Treatment: 5 History: Anemia, Sleep Apnea, Deep Vein Thrombosis, Clustered Wound: No Hypertension, Peripheral Arterial Disease, Type I Diabetes, Osteoarthritis, Osteomyelitis, Neuropathy, Seizure Disorder Photos Wound Measurements Length: (cm) 1.1 % Reduct Width:  (cm) 0.7 % Reduct Depth: (cm) 0.4 Epitheli Area: (cm) 0.605 Tunneli Volume: (cm) 0.242 Undermi Wound Description Classification: Full Thickness Without Exposed Support Foul Odo Structures Slough/F Wound Distinct, outline attached Margin: Exudate Medium Amount: Exudate Serosanguineous Type: Exudate red, brown Color: Wound Bed Granulation Amount: Large (67-100%) Granulation Quality: Pink, Hyper-granulation Fascia E Necrotic Amount: Small (1-33%) Fat Laye Necrotic Quality: Adherent Slough Tendon E Muscle E Joint Ex Bone Exp r After Cleansing: No ibrino Yes Exposed Structure xposed: No r (Subcutaneous Tissue) Exposed: Yes xposed: No xposed: No posed: No osed: No ion in Area: -37.5% ion in Volume: -450% alization: None ng: No ning: No Treatment Notes Wound #47 (Right, Anterior Lower Leg) 1. Cleanse With Wound Cleanser 2. Periwound Care Skin Prep 3. Primary Dressing Applied Calcium Alginate Ag 4. Secondary Dressing ABD Pad Dry [name] Roll [name] 5. Secured With Recruitment consultant) Signed: 03/10/2019 4:34:42 PM By: [name] EMT/HBOT Signed: 03/11/2019 12:04:56 PM By: [name] Entered By: [name] on 03/10/2019 13:50:46 -------------------------------------------------------------------------------- Vitals Details Patient Name: Date of Service: [name] 03/09/2019 8:45 AM Medical Record Number:[id number] Patient Account Number: [id number] Date of Birth/Sex: Treating RN: 17-Dec-1970 (48 y.o. [name] Primary Care Hades Mathew: [name] Other Clinician: Referring Archita Lomeli: Treating Findley Blankenbaker/Extender:[name], [name], [name] in Treatment: 26 Vital Signs Time Taken: 09:00 Temperature (F): 98.7 Height (in): 67 Pulse (bpm): 88 Weight (lbs): 125 Respiratory Rate (breaths/min): 17 Body Mass Index (BMI): 19.6 Blood Pressure (mmHg): 133/67 Capillary Blood Glucose (mg/dl): 367 Reference Range:  80 - 120 mg / dl Electronic Signature(s) Signed: 03/11/2019 12:04:56 PM By: [name] Entered By: [name] on 03/09/2019 09:04:42

## 2019-03-16 ENCOUNTER — Ambulatory Visit: Payer: Medicare HMO | Admitting: Endocrinology

## 2019-03-16 ENCOUNTER — Other Ambulatory Visit: Payer: Self-pay

## 2019-03-16 ENCOUNTER — Encounter (HOSPITAL_BASED_OUTPATIENT_CLINIC_OR_DEPARTMENT_OTHER): Payer: Medicare HMO | Admitting: Internal Medicine

## 2019-03-16 DIAGNOSIS — M86671 Other chronic osteomyelitis, right ankle and foot: Secondary | ICD-10-CM | POA: Diagnosis not present

## 2019-03-16 NOTE — Progress Notes (Addendum)
[name] ([id number]) Visit Report for 03/16/2019 Arrival Information Details Patient Name: Date of Service: [name], [name] 03/16/2019 10:00 AM Medical Record Number:[id number] Patient Account Number: [id number] Date of Birth/Sex: Treating RN: 09/30/1970 (48 y.o. [name] Primary Care [name]: [name] Other Clinician: Referring [name]: Treating [name]/Extender:[name], [name]: 28 Visit Information History Since Last Visit All ordered tests and consults were completed: No Patient Arrived: Wheel Chair Added or deleted any medications: No Arrival Time: 10:16 Any new allergies or adverse reactions: No Accompanied By: self Had a fall or experienced change in No activities of daily living that may affect Transfer Assistance: None risk of falls: Patient Identification Verified: Yes Signs or symptoms of abuse/neglect since last No Secondary Verification Process Completed: Yes visito Patient Requires Transmission-Based No Hospitalized since last visit: No Precautions: Implantable device outside of the clinic excluding No Patient Has Alerts: No cellular tissue based products placed in the center since last visit: Has Dressing in Place as Prescribed: Yes Pain Present Now: No Electronic Signature(s) Signed: 03/16/2019 5:55:53 PM By: Nancy Coria RN Entered By: [name] on 03/16/2019 10:17:06 -------------------------------------------------------------------------------- Encounter Discharge Information Details Patient Name: Date of Service: [name] 03/16/2019 10:00 AM Medical Record Number:[id number] Patient Account Number: [id number] Date of Birth/Sex: Treating RN: 07/16/1970 (48 y.o. [name] Primary Care Laquenta Whitsell: [name] Other Clinician: Referring Anija Brickner: Treating Tavon Corriher/Extender:[name], [name]: 38 Encounter Discharge Information Items  Post Procedure Vitals Discharge Condition: Stable Temperature (F): 98.2 Ambulatory Status: Wheelchair Pulse (bpm): 89 Discharge Destination: Home Respiratory Rate (breaths/min): 18 Transportation: Private Auto Blood Pressure (mmHg): 163/61 Accompanied By: self Schedule Follow-up Appointment: Yes Clinical Summary of Care: Electronic Signature(s) Signed: 03/16/2019 5:58:58 PM By: [name] Entered By: [name] on 03/16/2019 11:15:42 -------------------------------------------------------------------------------- Lower Extremity Assessment Details Patient Name: Date of Service: [name], [name] 03/16/2019 10:00 AM Medical Record Number:[id number] Patient Account Number: [id number] Date of Birth/Sex: Treating RN: 1970-05-10 (48 y.o. [name] Primary Care Chaney Maclaren: [name] Other Clinician: Referring [name]: Treating [name]/Extender:[name], [name]: 27 Edema Assessment Assessed: [Left: No] [Right: No] Edema: [Left: Ye] [Right: s] Calf Left: Right: Point of Measurement: cm From Medial Instep cm 27 cm Ankle Left: Right: Point of Measurement: cm From Medial Instep cm 18 cm Electronic Signature(s) Signed: 03/16/2019 5:55:53 PM By: Nancy Coria RN Entered By: [name] on 03/16/2019 10:19:21 -------------------------------------------------------------------------------- Multi Wound Chart Details Patient Name: Date of Service: [name] 03/16/2019 10:00 AM Medical Record Number:[id number] Patient Account Number: [id number] Date of Birth/Sex: Treating RN: [birth year] (48 y.o. 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] [44:Lower Extremity] Comorbid History: [43:Cataracts, Chronic sinus Cataracts, Chronic sinus Cataracts, Chronic sinus problems/congestion, Anemia, Sleep Apnea, Deep Anemia, Sleep Apnea, Deep Anemia, Sleep Apnea, Deep Vein Thrombosis, Hypertension, Peripheral Hypertension,  Peripheral Hypertension, Peripheral Arterial Disease, Type I Diabetes, Osteoarthritis, Osteomyelitis, Neuropathy, Osteomyelitis, Neuropathy, Osteomyelitis, Neuropathy, Seizure Disorder] [44:problems/congestion, Vein Thrombosis, Arterial Disease, Type I  Diabetes, Osteoarthritis, Seizure Disorder] [46:problems/congestion, Vein Thrombosis, Arterial Disease, Type I Diabetes, Osteoarthritis, Seizure  Disorder] Date Acquired: [43:08/04/2018] [44:08/28/2018] [46:12/25/2018] Weeks of [name]: [43:27] [44:27] [46:11] Wound Status: [43:Open] [44:Healed - Epithelialized] [46:Healed - Epithelialized] Clustered Wound: [43:Yes] [44:No] [46:Yes] Clustered Quantity: [43:1] [44:N/A] [46:2] Measurements L x W x D 5x2x0.9 [44:0x0x0] [46:0x0x0] (cm) Area (cm) : [43:7.854] [44:0] [46:0] Volume (cm) : [24:4.010] [44:0] [46:0] % Reduction in Area: [43:89.20%] [44:100.00%] [46:100.00%] % Reduction in Volume: 67.50% [44:100.00%] [46:100.00%] Classification: [43:Grade 4] [44:Grade 2] [46:Full Thickness Without Exposed Support Structures] Exudate Amount: [43:Medium] [44:None Present] [46:None Present] Exudate Type: [43:Serosanguineous] [44:N/A] [46:N/A] Exudate Color: [43:red, brown] [44:N/A] [46:N/A] Wound Margin: [43:Well defined, not attached Flat and Intact] [46:Well defined, not  attached] Granulation Amount: [43:Large (67-100%)] [44:None Present (0%)] [46:None Present (0%)] Granulation Quality: [43:Red, Pink] [44:N/A] [46:N/A] Necrotic Amount: [43:Small (1-33%)] [44:None Present (0%)] [46:None Present (0%)] Exposed Structures: [43:Fat Layer (Subcutaneous Fascia: No Tissue) Exposed: Yes Tendon: Yes Muscle: Yes Fascia: No Joint: No Bone: No] [44:Fat Layer (Subcutaneous Fat Layer (Subcutaneous Tissue) Exposed: No Tendon: No Muscle: No Joint: No Bone: No] [46:Fascia: No Tissue)  Exposed: No Tendon: No Muscle: No Joint: No Bone: No] Epithelialization: [43:Small (1-33%)] [44:Large (67-100%)] [46:None] Debridement: [43:Debridement - Excisional N/A] [46:N/A] Pre-procedure [43:11:00] [44:N/A] [46:N/A] Verification/Time Out Taken: Tissue Debrided: [43:Subcutaneous, Slough] [44:N/A] [46:N/A] Level: [43:Skin/Subcutaneous Tissue N/A] [46:N/A] Debridement Area (sq cm):4 [44:N/A] [46:N/A] Instrument: [43:Curette] [44:N/A] [46:N/A] Bleeding: [43:Minimum] [44:N/A] [46:N/A] Hemostasis Achieved: [43:Pressure] [44:N/A N/A] Procedural Pain: [43:0] [44:N/A N/A] Post Procedural Pain: [43:0] [44:N/A N/A] Debridement [name] [43:Procedure was tolerated] [44:N/A N/A] Response: [43:well] Post Debridement [43:5x2x0.9] [44:N/A N/A] Measurements L x W x D (cm) Post Debridement [43:7.069] [44:N/A N/A] Volume: (cm) Procedures Performed: [43:Debridement] [44:N/A N/A 47 N/A] [46:N/A] Photos: [43:No Photos] [44:N/A N/A] Wound Location: [43:Right Lower Leg - Anterior N/A] [44:N/A] Wounding Event: [43:Gradually Appeared] [44:N/A N/A] Primary Etiology: [43:Abscess] [44:N/A N/A] Comorbid History: [43:Cataracts, Chronic sinus N/A problems/congestion, Anemia, Sleep Apnea, Deep Vein Thrombosis, Hypertension, Peripheral Arterial Disease, Type I Diabetes, Osteoarthritis, Osteomyelitis, Neuropathy, Seizure Disorder] [44:N/A] Date Acquired: [43:01/31/2019] [44:N/A N/A] Weeks of [name]: [43:6]  [44:N/A N/A] Wound Status: [43:Open] [44:N/A N/A] Clustered Wound: [43:No] [44:N/A N/A] Clustered Quantity: [43:N/A] [44:N/A N/A] Measurements L x W x D 0.8x0.5x0.1 [44:N/A N/A] (cm) Area (cm) : [43:0.314] [44:N/A N/A] Volume (cm) : [43:0.031] [44:N/A N/A] % Reduction in Area: [43:28.60%] [44:N/A N/A] % Reduction in Volume: 29.50% [44:N/A N/A] Classification: [43:Full Thickness Without Exposed Support Structures] [44:N/A N/A] Exudate Amount: [43:Medium] [44:N/A N/A] Exudate Type: [43:Serosanguineous] [44:N/A N/A] Exudate Color: [43:red, brown] [44:N/A N/A] Wound Margin: [43:Distinct, outline attached N/A] [44:N/A] Granulation Amount: [43:Large (67-100%)] [44:N/A N/A] Granulation Quality: [43:Pink, Hyper-granulation] [44:N/A N/A] Necrotic Amount: [43:Small (1-33%)] [44:N/A N/A] Exposed Structures: [43:Fat Layer (Subcutaneous N/A Tissue) Exposed: Yes Fascia: No Tendon: No Muscle: No Joint: No Bone: No] [44:N/A] Epithelialization: [43:None] [44:N/A N/A] Debridement: [43:N/A] [44:N/A N/A] Tissue Debrided: [43:N/A] [44:N/A N/A] Level: [43:N/A] [44:N/A N/A] Debridement Area (sq cm):N/A [44:N/A N/A] Instrument: [43:N/A] [44:N/A N/A] Bleeding: [43:N/A] [44:N/A N/A] Hemostasis Achieved: [43:N/A] [44:N/A N/A] Procedural Pain: [43:N/A] [44:N/A] [46:N/A] Post Procedural Pain: [43:N/A] [44:N/A] [46:N/A] Debridement [name] [43:N/A] [44:N/A] [46:N/A] Response: Post Debridement [43:N/A] [44:N/A] [46:N/A] Measurements L x W x D (cm) Post Debridement [43:N/A] [44:N/A] [46:N/A] Volume: (cm) Procedures Performed: [43:N/A] [44:N/A] [46:N/A] [name] Notes Wound #43 (Right, Medial Foot) 1. Cleanse With Wound Cleanser 3. Primary Dressing Applied Calcium Alginate Ag 4. Secondary Dressing Dry [name] Roll [name] 5. Secured With Tape Wound #47 (Right, Anterior Lower Leg) 1. Cleanse With Wound Cleanser 3. Primary Dressing Applied Calcium Alginate Ag 4. Secondary Dressing Dry  [name] Roll [name]  5. Secured With Recruitment consultant) Signed: 03/16/2019 5:56:50 PM By: Linton Ham MD Signed: 03/16/2019 6:02:21 PM By: [name] RN, BSN Entered By: Linton Ham on 03/16/2019 11:36:13 -------------------------------------------------------------------------------- Multi-Disciplinary Care Plan Details Patient Name: Date of Service: [name] 03/16/2019 10:00 AM Medical Record Number:[id number] Patient Account Number: [id number] Date of Birth/Sex: Treating RN: 06-Dec-1970 (48 y.o. Nancy Fetter Primary Care Chelan Heringer: [name] Other Clinician: Referring Abdulahad Mederos: Treating Aydin Hink/Extender:[name], [name]: 28 Active Inactive Wound/Skin Impairment Nursing Diagnoses: Impaired tissue integrity Knowledge deficit related to ulceration/compromised skin integrity Goals: Patient/caregiver will verbalize understanding of skin care regimen Date Initiated: 09/03/2018 Target Resolution Date: 04/03/2019 Goal Status: Active Ulcer/skin breakdown will have a volume reduction of 30% by week 4 Date Initiated: 09/03/2018 Date Inactivated: 10/07/2018 Target Resolution Date: 10/01/2018 Unmet Goal Status: Unmet Reason: COMORBITIES Ulcer/skin breakdown will have a volume reduction of 50% by week 8 Date Initiated: 10/07/2018 Date Inactivated: 11/03/2018 Target Resolution Date: 10/31/2018 Unmet Goal Status: Unmet Reason: Osteomyelitis Interventions: Assess patient/caregiver ability to obtain necessary supplies Assess patient/caregiver ability to perform ulcer/skin care regimen upon admission and as needed Assess ulceration(s) every visit Provide education on ulcer and skin care [name] Activities: Skin care regimen initiated : 09/03/2018 Topical wound management initiated : 09/03/2018 Notes: Electronic Signature(s) Signed: 03/16/2019 6:02:21 PM By: [name] RN, BSN Entered By: [name] on 03/16/2019  10:57:09 -------------------------------------------------------------------------------- Pain Assessment Details Patient Name: Date of Service: [name] 03/16/2019 10:00 AM Medical Record Number:[id number] Patient Account Number: [id number] Date of Birth/Sex: Treating RN: 10/01/1970 (48 y.o. [name] Primary Care Ettel Albergo: [name] Other Clinician: Referring Katalyn Matin: Treating [place]/Extender:[name], [name]: 27 Active Problems Location of Pain Severity and Description of Pain Patient Has Paino No Site Locations Pain Management and Medication Current Pain Management: Electronic Signature(s) Signed: 03/16/2019 5:55:53 PM By: Nancy Coria RN Entered By: [name] on 03/16/2019 10:18:43 -------------------------------------------------------------------------------- Patient/Caregiver Education Details [name] 12/14/2020andnbsp10:00 Patient Name: Date of Service: L. AM Medical Record Patient Account Number: [id number] Number: Treating RN: [name] Date of Birth/Gender: [birth year] (48 y.o. F) Other Clinician: Primary Care Physician:MOREIRA, [name] Treating Linton Ham Referring Physician: Physician/Extender: Juanito Doom in [name]: 48 Education Assessment Education Provided To: Patient Education Topics Provided Wound/Skin Impairment: Methods: Explain/Verbal Responses: State content correctly Electronic Signature(s) Signed: 03/16/2019 6:02:21 PM By: [name] RN, BSN Entered By: [name] on 03/16/2019 10:57:25 -------------------------------------------------------------------------------- Wound Assessment Details Patient Name: Date of Service: [name] 03/16/2019 10:00 AM Medical Record Number:[id number] Patient Account Number: [id number] Date of Birth/Sex: Treating RN: 1970/04/26 (48 y.o. [name] Primary Care Suan Pyeatt: [name] Other Clinician: Referring Marvon Shilling[place]: Treating Kaesha Kirsch/Extender:[name], [name]: 27 Wound Status Wound Number: 43 Primary Diabetic Wound/Ulcer of the Lower Extremity Etiology: Wound Location: Right Foot - Medial Wound Open Wounding Event: Gradually Appeared Status: Date Acquired: 08/04/2018 Comorbid Cataracts, Chronic sinus problems/congestion, Weeks Of [name]: 27 History: Anemia, Sleep Apnea, Deep Vein Thrombosis, Clustered Wound: Yes Hypertension, Peripheral Arterial Disease, Type I Diabetes, Osteoarthritis, Osteomyelitis, Neuropathy, Seizure Disorder Photos Wound Measurements Length: (cm) 5 % Reduction in Width: (cm) 2 % Reduction in Depth: (cm) 0.9 Epithelializati Clustered Quantity: 1 Tunneling: Area: (cm) 7.854 Undermining: Volume: (cm) 7.069 Wound Description Classification: Grade 4 Foul Odor Afte Wound Margin: Well defined, not attached Slough/Fibrino Exudate Amount: Medium Exudate Type: Serosanguineous Exudate Color: red, brown Wound Bed Granulation Amount: Large (67-100%) Granulation Quality: Red, Pink Fascia Exposed Necrotic Amount: Small (1-33%) Fat  Layer (Sub Necrotic Quality: Adherent Slough Tendon Exposed Muscle Exposed Necrosis Joint Exposed: Bone Exposed: [name] Notes Wound #43 (Right, Medial Foot) 1. Cleanse With Wound Cleanser 3. Primary Dressing Applied Calcium Alginate Ag 4. Secondary Dressing Dry [name] Roll [name] 5. Secured With Tape r Cleansing: No Yes Exposed Structure : No cutaneous Tissue) Exposed: Yes : Yes : Yes of Muscle: Yes No No Area: 89.2% Volume: 67.5% on: Small (1-33%) No No Electronic Signature(s) Signed: 03/18/2019 3:47:19 PM By: [name] EMT/HBOT Signed: 03/19/2019 9:23:41 AM By: Nancy Coria RN Previous Signature: 03/16/2019 5:55:53 PM Version By: Nancy Coria RN Entered By: [name] on 03/18/2019  14:39:09 -------------------------------------------------------------------------------- Wound Assessment Details Patient Name: Date of Service: [name] 03/16/2019 10:00 AM Medical Record Number:[id number] Patient Account Number: [id number] Date of Birth/Sex: Treating RN: [birth year] (48 y.o. [name] Primary Care Gloria Ricardo: [name] Other Clinician: Referring Arron Mcnaught: Treating Niasia Lanphear/Extender:[name], [name]: 27 Wound Status Wound Number: 44 Primary Diabetic Wound/Ulcer of the Lower Extremity Etiology: Wound Location: Right Calcaneus Wound Healed - Epithelialized Wounding Event: Gradually Appeared Status: Date Acquired: 08/28/2018 Comorbid Cataracts, Chronic sinus problems/congestion, Weeks Of [name]: 27 History: Anemia, Sleep Apnea, Deep Vein Thrombosis, Clustered Wound: No Hypertension, Peripheral Arterial Disease, Type I Diabetes, Osteoarthritis, Osteomyelitis, Neuropathy, Seizure Disorder Photos Wound Measurements Length: (cm) 0 % Reductio Width: (cm) 0 % Reductio Depth: (cm) 0 Epithelial Area: (cm) 0 Tunneling Volume: (cm) 0 Undermini Wound Description Classification: Grade 2 Foul Odor Wound Margin: Flat and Intact Slough/Fib Exudate Amount: None Present Wound Bed Granulation Amount: None Present (0%) Necrotic Amount: None Present (0%) Fascia Exp Fat Layer Tendon Exp Muscle Exp Joint Expo Bone Expos After Cleansing: No rino No Exposed Structure osed: No (Subcutaneous Tissue) Exposed: No osed: No osed: No sed: No ed: No n in Area: 100% n in Volume: 100% ization: Large (67-100%) : No ng: No Electronic Signature(s) Signed: 03/18/2019 3:47:19 PM By: [name] EMT/HBOT Signed: 03/19/2019 9:23:41 AM By: Nancy Coria RN Previous Signature: 03/16/2019 5:55:53 PM Version By: Nancy Coria RN Previous Signature: 03/16/2019 6:02:21 PM Version By: [name] RN, BSN Entered By: [name] on 03/18/2019 14:41:09 -------------------------------------------------------------------------------- Wound Assessment Details Patient Name: Date of Service: [name] 03/16/2019 10:00 AM Medical Record Number:[id number] Patient Account Number: [id number] Date of Birth/Sex: Treating RN: 04-Sep-1970 (48 y.o. [name] Primary Care Joud Ingwersen: [name] Other Clinician: Referring Soila Printup: Treating Teyanna Thielman/Extender:[name], [name]: 27 Wound Status Wound Number: 46 Primary Abscess Etiology: Wound Location: Right Lower Leg - Lateral Wound Healed - Epithelialized Wounding Event: Other Lesion Status: Date Acquired: 12/25/2018 Notes: MD IandD today. Weeks Of [name]: 11 Comorbid Cataracts, Chronic sinus problems/congestion, Clustered Wound: Yes History: Anemia, Sleep Apnea, Deep Vein Thrombosis, Hypertension, Peripheral Arterial Disease, Type I Diabetes, Osteoarthritis, Osteomyelitis, Neuropathy, Seizure Disorder Photos Wound Measurements Length: (cm) 0 % Reduct Width: (cm) 0 % Reduct Depth: (cm) 0 Epitheli Clustered Quantity: 2 Tunnelin Area: (cm) 0 Undermi Volume: (cm) 0 Wound Description Classification: Full Thickness Without Exposed Support Foul Od Structures Slough/ Wound Well defined, not attached Margin: Exudate None Present Amount: Wound Bed Granulation Amount: None Present (0%) Necrotic Amount: None Present (0%) Fascia Fat Lay Tendon Muscle Joint E Bone Ex or After Cleansing: No Fibrino No Exposed Structure Exposed: No er (Subcutaneous Tissue) Exposed: No Exposed: No Exposed: No xposed: No posed: No ion in Area: 100% ion in Volume: 100% alization: None g: No ning: No Electronic Signature(s) Signed: 03/18/2019 3:47:19 PM By: Ronnald Ramp,  Dedrick EMT/HBOT Signed: 03/19/2019 9:23:41 AM By: Nancy Coria RN Previous Signature: 03/16/2019 5:55:53 PM Version By: Nancy Coria RN Entered By:  [name] on 03/18/2019 14:40:24 -------------------------------------------------------------------------------- Wound Assessment Details Patient Name: Date of Service: [name] 03/16/2019 10:00 AM Medical Record Number:[id number] Patient Account Number: [id number] Date of Birth/Sex: Treating RN: 11-14-1970 (48 y.o. [name] Primary Care Fate Galanti: [name] Other Clinician: Referring Pheonix Clinkscale: Treating Saladin Petrelli/Extender:[name], [name]: 27 Wound Status Wound Number: 47 Primary Abscess Etiology: Etiology: Wound Location: Right Lower Leg - Anterior Wound Open Wounding Event: Gradually Appeared Status: Date Acquired: 01/31/2019 Comorbid Cataracts, Chronic sinus problems/congestion, Weeks Of [name]: 6 History: Anemia, Sleep Apnea, Deep Vein Thrombosis, Clustered Wound: No Hypertension, Peripheral Arterial Disease, Type I Diabetes, Osteoarthritis, Osteomyelitis, Neuropathy, Seizure Disorder Photos Wound Measurements Length: (cm) 0.8 % Reduct Width: (cm) 0.5 % Reduct Depth: (cm) 0.1 Epitheli Area: (cm) 0.314 Tunneli Volume: (cm) 0.031 Undermi Wound Description Classification: Full Thickness Without Exposed Support Foul Odo Structures Slough/F Wound Distinct, outline attached Margin: Exudate Medium Amount: Exudate Serosanguineous Type: Exudate red, brown Color: Wound Bed Granulation Amount: Large (67-100%) Granulation Quality: Pink, Hyper-granulation Fascia E Necrotic Amount: Small (1-33%) Fat Laye Necrotic Quality: Adherent Slough Tendon E Muscle E Joint Ex Bone Exp r After Cleansing: No ibrino Yes Exposed Structure xposed: No r (Subcutaneous Tissue) Exposed: Yes xposed: No xposed: No posed: No osed: No ion in Area: 28.6% ion in Volume: 29.5% alization: None ng: No ning: No [name] Notes Wound #47 (Right, Anterior Lower Leg) 1. Cleanse With Wound Cleanser 3. Primary Dressing  Applied Calcium Alginate Ag 4. Secondary Dressing Dry [name] Roll [name] 5. Secured With Recruitment consultant) Signed: 03/18/2019 3:47:19 PM By: [name] EMT/HBOT Signed: 03/19/2019 9:23:41 AM By: Nancy Coria RN Previous Signature: 03/16/2019 5:55:53 PM Version By: Nancy Coria RN Entered By: [name] on 03/18/2019 14:39:38 -------------------------------------------------------------------------------- Vitals Details Patient Name: Date of Service: [name] 03/16/2019 10:00 AM Medical Record Number:[id number] Patient Account Number: [id number] Date of Birth/Sex: Treating RN: 29-May-1970 (48 y.o. [name] Primary Care Arabel Barcenas: [name] Other Clinician: Referring Ulas Zuercher: Treating Quincie Haroon/Extender:[name], [name]: 27 Vital Signs Time Taken: 10:17 Temperature (F): 98.2 Height (in): 67 Pulse (bpm): 89 Weight (lbs): 125 Respiratory Rate (breaths/min): 18 Body Mass Index (BMI): 19.6 Blood Pressure (mmHg): 163/61 Reference Range: 80 - 120 mg / dl Electronic Signature(s) Signed: 03/16/2019 5:55:53 PM By: Nancy Coria RN Entered By: [name] on 03/16/2019 10:17:40

## 2019-03-16 NOTE — Progress Notes (Signed)
SUMNER, BOESCH ([id number]) Visit Report for 03/16/2019 Debridement Details Patient Name: [name], [name]. Date of Service: 03/16/2019 10:00 AM Medical Record Number:[id number] Patient Account Number: [id number] Date of Birth/Sex: Treating RN: [birth year] (48 y.o. Nancy Fetter Primary Care Provider: [name] Other Clinician: Referring Provider: Treating Provider/Extender:Delylah Stanczyk, [name], [name] in Treatment: 27 Debridement Performed for Wound #43 Right,Medial Foot Assessment: Performed By: Physician Ricard Dillon., MD Debridement Type: Debridement Severity of Tissue Pre Fat layer exposed Debridement: Level of Consciousness (Pre- Awake and Alert procedure): Pre-procedure Verification/Time Out Taken: Yes - 11:00 Start Time: 11:00 Total Area Debrided (L x W): 2 (cm) x 2 (cm) = 4 (cm) Tissue and other material Viable, Non-Viable, Slough, Subcutaneous, Slough debrided: Level: Skin/Subcutaneous Tissue Debridement Description: Excisional Instrument: Curette Bleeding: Minimum Hemostasis Achieved: Pressure End Time: 11:02 Procedural Pain: 0 Post Procedural Pain: 0 Response to Treatment: Procedure was tolerated well Level of Consciousness Awake and Alert (Post-procedure): Post Debridement Measurements of Total Wound Length: (cm) 5 Width: (cm) 2 Depth: (cm) 0.9 Volume: (cm) 7.069 Character of Wound/Ulcer Post Improved Debridement: Severity of Tissue Post Debridement: Fat layer exposed Post Procedure Diagnosis Same as Pre-procedure Electronic Signature(s) Signed: 03/16/2019 5:56:50 PM By: Linton Ham MD Signed: 03/16/2019 6:02:21 PM By: [name] RN, BSN Entered By: Linton Ham on 03/16/2019 11:36:40 -------------------------------------------------------------------------------- HPI Details Patient Name: Date of Service: [name] 03/16/2019 10:00 AM Medical Record Number:[id number] Patient Account Number:  [id number] Date of Birth/Sex: Treating RN: [birth year] (48 y.o. Nancy Fetter Primary Care Provider: [name] Other Clinician: Referring Provider: Treating Provider/Extender:Nikiya Starn, [name], [name] in Treatment: 6 History of Present Illness HPI Description: 48 year old diabetic who is known to have type 1 diabetes which is poorly controlled last hemoglobin A1c was 11%. She comes in with a ulcerated area on the left lateral foot which has been there for over 6 months. Was recently she has been treated by [name] of podiatry who saw her last on 05/28/2016. Review of his notes revealed that the patient had incision and drainage with placement of antibiotic beads to the left foot on 04/11/2016 for possible osteomyelitis of the cuboid bone. Over the last year she's had a history of amputation of the left fifth toe and a femoropopliteal popliteal bypass graft somewhere in April 2017. 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 His note [name] mentions that the patient has been referred to me for further wound care and possibly great candidate for hyperbaric oxygen therapy due to recurrent osteomyelitis. However we do not have any x-rays of biopsy reports confirming this. He has been on several antibiotics including Bactrim and most recently is on doxycycline for an MRSA. I understand, the patient was not a candidate for IV antibiotics as she has had previous PICC lines which resulted in blood clots in both arms. There was a x-ray report dated 04/04/2016 on [name] notes which showed evidence of fifth ray resection left foot with osteolytic changes noted to the fourth metatarsal and cuboid bone on the left. 06/13/2016 -- had a left foot x-ray which showed no acute fracture or dislocation and no definite radiographic evidence of osteomyelitis. Advanced osteopenia was seen. 06/20/2016 -- she has noticed a new wound on the right plantar foot in the region  where she had a callus before. 06/27/16- the patient did have her x-ray of the right foot which showed no findings to suggest osteomyelitis. She saw her endocrinologist, [name], yesterday. Her A1c in January was 11. He  also indicates mismanagement and noncompliance regarding her diabetes. She is currently on Bactrim for a lip infection. She is complaining of nausea, vomiting and diarrhea. She is unable to articulate the exact orders or dosing of the Bactrim; it is unclear when she will complete this. 07/04/2016 -- results from [facility] of ABIs with ankle waveforms were noted from 02/14/2016. The examination done on 06/27/2015 showed noncompressible ABIs with the right being 1.45 and the left being 1.33. The present examination showed a right ABI of 1.19 on the left of 1.33. The conclusion was that right normal ABI in the lower extremity at rest however compared to previous study which was noncompressible ABI may be falsely elevated side suggesting medial calcification. The left ABI suggested medial calcification. 08/01/2016 -- the patient had more redness and pain on her right foot and did not get to come to see as noted she see her PCP or go to the ER and decided to take some leftover metronidazole which she had at home. As usual, the patient does report she feels and is rather noncompliant. 08/08/2016 -- -- x-ray of the right foot -- FINDINGS:Transmetatarsal amputation is noted. No bony destruction is noted to suggest osteomyelitis. IMPRESSION: No evidence of osteomyelitis. Postsurgical changes are seen. MRI would be more sensitive for possible bony changes. Culture has grown Serratia Marcescens -- sensitive to Bactrim, ciprofloxacin, ceftazidime she was seen by [name] on 08/06/2016. He did not find any exposed bone, muscle, tendon, ligament or joint. There was no malodor and he did a excisional debridement in the office. ============ Old notes: 48 year old patient who is  known to the wound clinic for a while had been away from the wound clinic since 09/01/2014. Over the last several months she has been admitted to various hospitals including [place] at [facility]. She was treated for a right metatarsal osteomyelitis with a transmetatarsal amputation and this was done about 2 months ago. He has a small ulcerated area on the right heel and she continues to have an ulcerated area on the left plantar aspect of the foot. The patient was recently admitted to the [facility] hospital group between 7/12 and 10/18/2014. she was given 3 weeks of IV vancomycin and was to follow-up with her surgeons at [facility] and also took oral vancomycin for C. difficile colitis. 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 MRI of the left foot done on 10/14/2014 showed no abscess or osteomyelitis. 04/27/15; this is a patient we know from previous stays in the wound care center. She is a type I diabetic I am not sure of her control currently. Since the last time I saw her she is had a right transmetatarsal amputation and has no wounds on her right foot and has no open wounds. She is been followed at the wound care center at [facility] in [place]. She comes today with the desire to undergo hyperbaric treatment locally. Apparently one of her wound care providers in [place] has suggested hyperbarics. This is in response to an MRI from 04/18/15 that showed increased marrow signal and loss of the proximal fifth metatarsal cortex evidence of osteomyelitis with likely early osteomyelitis in the cuboid bone as well. She has a large wound over the base of the fifth metatarsal. She also has a eschar  over her the  tips of her toes on 1,3 and 5. She does not have peripheral pulses and apparently is going for an angiogram tomorrow which seems reasonable. After this she is going to infectious disease at [facility]. They have been using Medihoney to the large wound on the lateral aspect of the left foot to. The patient has known Charcot deformity from diabetic neuropathy. She also has known diabetic PAD. Surprisingly I can't see that she has had any recent antibiotics, the patient states the last antibiotic she had was at the end of November for 10 days. I think this was in response to culture that showed group G strep although I'm not exactly sure where the culture was from. She is also had arterial studies on 03/29/15. This showed a right ABI of 1.4 that was noncompressible. Her left ABI was 0.73. 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 It was not felt that arterial inflow was adequate for healing of a foot ulcer. Her Doppler waveforms looked monophasic ===== READMISSION 02/28/17; this is in an now 48 year old woman we've had at several different occasions in this clinic. 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 She has a remote ex-smoker. She was last seen in this clinic by Dr. Con Memos I think in May. More recently she is been followed by her podiatrist [name] an infectious disease Dr. Megan Salon. She has 2 open wounds the major one is over the right first metatarsal head she also has a wound on the left plantar foot. an MRI of the right foot on 01/01/17 showed a soft tissue ulcer along the plantar aspect of the first metatarsal base consistent with osteomyelitis of the first metatarsal stump. Dr. 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 According to the patient this is been open for slightly over a month. She has been on a combination of Cipro 500 twice a  day, Zyvox 600 twice a day and Flagyl 500 3 times a day for over a month now as directed by Dr. Megan Salon. cultures of the right foot earlier this year showed MRSA in January and Serratia in May. January also had a few viridans strep. Recent x-rays of both feet were done and [name] office and I don't have these reports. The patient has known PAD and has a history of aleft femoropopliteal bypass in April 2017. 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 Dwyane Dee. In spite of this the last hemoglobin A1c I can see is 10.1 on 01/01/2017. She is being referred by 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 She is been using Medihoney to this area and also an area on the left midfoot. She is using healing sandals bilaterally. ABIs in this clinic at the left posterior tibial was 1.1 noncompressible on the right READMISSION Non invasive vascular NOVANT 5/18 Aftercare following surgery of the circulatory system Procedure Note - Interface, External Ris In - 08/13/2016 11:05 AM EDT Procedure: Examination consists of physiologic resting arterial pressures of the brachial and ankle arteries bilaterally with continuous wave Doppler waveform analysis. Previous: Previous exam performed on 02/14/16 demonstrated ABIs of Rt = 1.19 and Lt = 1.33. Right: ABI = non-compressible  PT, 1.47 DP. S/P transmet amputation. Left: ABI = 1.52, 2nd digit pressure = 87 mmHg Conclusions: Right: ABI (>1.3) may be falsely elevated, suggesting medial calcification. Left: ABI (>1.3) may be falsely elevated, suggesting medial calcification The patient is a now 48 year old type I diabetic is had multiple issues her graded to chronic diabetic foot ulcers. She has had a previous right  transmetatarsal amputation fifth ray amputation. She had Charcot feet diabetic polyneuropathy. We had her in the clinic lastin November. At that point she had wounds on her bilateral feet.she had wanted to try hyperbarics however the healogics review process denied her because she hadn't followed up with her vascular surgeon for her left femoropopliteal bypass. The bypass was done by Dr. Raul Del at [facility]. We made her a follow-up with Dr. Raul Del however she did not keep the appointment and therefore she was not approved The patient shows me a small wound on her left fourth metatarsal head on her phone. She developed rapid discoloration in the plantar aspect of the left foot and she was admitted to hospital from 2/2 through 05/10/17 with wet gangrene of the left foot osteomyelitis of the fourth metatarsal heads. She was admitted acutely ill with a temperature of 103. She was started on broad-spectrum vancomycin and cefepime. On 05/06/17 she was taken to the OR by [name] her podiatric surgeon for an incision and drainage irrigation of the left foot wound. Cultures from this surgery revealed group be strep and anaerobes. she was seen by Dr.Xu of orthopedic surgery and scheduled for a below-knee amputation which she u refused. Ultimately she was discharged on Levaquin and Flagyl for one month. MRI 05/05/17 done while she was in the hospital showed abscess adjacent to the fourth metatarsal head and neck small abscess around the fourth flexor tendon. Inflammatory phlegmon and gas in the soft tissues along the lateral aspect of the fourth phalanx. Findings worrisome for osteomyelitis involving the fourth proximal and middle phalanx and also the third and fourth metatarsals. Finally the patient had actually shortly before this followed up with Dr. Raul Del at no time on 04/29/17. He felt that her left femoropopliteal bypass was patent he felt that her left-sided toe pressures more than adequate for  healing a wound on the left foot. This was before her acute presentation. 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 05/28/17; she is started hyperbarics. The patient tells me that for some reason she was not actually on Levaquin but I think on ciprofloxacin. She was on Flagyl. She only started her Levaquin yesterday due to some difficulty with the pharmacy and perhaps her sister picking it up. She has an appointment with [name] tomorrow and with infectious disease early next week. She has no new complaints 06/06/17; the patient continues in hyperbarics. She saw [name] on 05/29/17 who is her podiatric surgeon. He is elected for a transmetatarsal amputation on 06/27/17. I'm not sure at what level he plans to do this amputation. The patient is unaware She also saw Dr. Megan Salon of infectious disease who elected to continue her on current antibiotics I think this is ciprofloxacin and Flagyl. I'll need to clarify with her tomorrow if she actually has this. We're using silver alginate to the actual wound. Necrotic surface today with material under the flap of her foot. Original MRI showed abscesses as well as osteomyelitis of the proximal and middle fourth phalanx and the third and fourth metatarsal heads 06/11/17; patient continues in hyperbarics and continues on oral antibiotics. She is doing well.  The wound looks better. The necrotic part of this under the flap in her superior foot also looks better. she is been to see [name]. I haven't had a chance to look at his note. Apparently he has put the transmetatarsal amputation on hold her request it is still planning to take her to the OR for debridement and product application ACEL. I'll see if I can find his note. I'll therefore leave product ordering/requests to [name] for now. I was going to look at Dermagraft 06/18/17-she is here in follow-up evaluation for bilateral foot wounds. She continues with hyperbaric therapy. She states she has been  applying manuka honey to the right plantar foot and alternate manuka honey and silver alginate to the left foot, despite our orders. We will continue with same treatment plan and she will follo up next week. 06/25/17; I have reviewed [name] last note from 3/11. She has operative debridement in 2 days' time. By review his note apparently they're going to place there is skin over the majority of this wound which is a good choice. She has a small satellite area at the most proximal part of this wound on the left plantar foot. The area on the right plantar foot we've been using silver alginate and it is close to healing. 07/02/17; unfortunately the patient was not easily approved for [name] proposed surgery. I'm not completely certain what the issue is. She has been using silver alginate to the wound she has completed a first course of hyperbarics. She is still on Levaquin and Flagyl. I have really lost track of the time course here.I suspect she should have another week to 2 of antibiotics. I'll need to see if she is followed up with infectious disease Dr. Megan Salon 07/09/17; the patient is followed up with Dr. Megan Salon. 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 She continues on Levaquin and metronidazole continuing both of these for now I think she is been on fr about 6 weeks. She still has some drainage but no pain. No fever. Her had been plans for her to go to the OR for operative debridement with her podiatrist [name], I am not exactly sure where that is. I'll probably slip a note to [name] today. I note that she follows with Dr. Dwyane Dee of endocrinology. We have her recertified for hyperbaric oxygen. I have not heard about Dermagraft however I'll see if [name] is planning a skin substitute as well 07/16/17; the patient tells me she is just about out of [place]. I'll need to check Dr. Hale Bogus last notes on this. She states she has plenty of  Flagyl however. She comes in today complaining of pain in the right lateral foot which she said lasted for about a day. The wound on the right foot is actually much more medially. She also tells me that the [facility] cost a lot of pain in the left foot wound and she turned back to silver alginate. Finally Dermagraft has a $270 per application co-pay. She cannot afford this 07/23/17; patient arrives today with the wound not much smaller. There is not much new to add. She has not heard from [name] all try to put in a call to them today. She was asking about Dermagraft again and she has an over $623 per application co-pay she states that she would be willing to try to do a payment plan. I been tried to avoid this. We've  been using silver alginate, I'll change to [facility] 07/30/17-She is here in follow-up evaluation for left foot ulcer. She continues hyperbaric medicine. The left foot ulcer is stable we will continue with same treatment plan 08/06/17; she is here for evaluation of her left foot ulcer. Currently being treated for hyperbarics or underlying osteomyelitis. She is completed antibiotics. The left foot ulcer is better smaller with healthier looking granulation. For various reasons I am not really clear on we never got her back to the OR with [name]. He did not respond to my secure text message. Nevertheless I think that surgery on this point is not necessary nor am I completely clear that a skin substitute is necessary The patient is complaining about pain on the outside of her right foot. 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 There is no erythema. She also states the foot is warm versus her other part of her upper leg and this is largely true. It is not totally clear to me what's causing this. She thinks it's different from her usual neuropathy pain 08/13/17; she arrives in clinic today with a small wound which is superficial on her right first metatarsal head.  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 She tells [place] she was up on her feet over the Mother's Day celebration. The large wound is on the left foot. Continues with hyperbarics for underlying osteomyelitis. We're using Hydrofera Blue. She asked me today about where we were with Dermagraft. I had actually excluded this because of the co- pay however she wants to assume this therefore I'll recheck the co-pay an order for next week. 08/20/17; the patient agreed to accept the co-pay of the first Apligraf which we applied today. She is disappointed she is finishing hyperbarics will run this through the insurance on the extent of the foot infection and the extent of the wound that she had however she is already had 60 dive's. Dermagraft No. 1 08/27/17; Dermagraft No. 2. She is not eligible for any more hyperbaric treatments this month. She reports a fair amount of drainage and she actually changed to the external dressings without disturbing the direct contact layer 09/03/17; the patient arrived in clinic today with the wound superficially looking quite healthy. Nice vibrant red tissue with some advancing epithelialization although not as much adherence of the flap as I might like. However she noted on her own fourth toe some bogginess and she brought that to our attention. Indeed this was boggy feeling like a possibility of subcutaneous fluid. 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 She is not been unwell. We've been using Dermagraft 09/10/17; the culture that I did not last week was MRSA. She saw Dr. Megan Salon this morning who is going to start her on vancomycin. I had sent him a secure a text message yesterday. I also spoke with her podiatric surgeon [name] about surgery on this foot the options for conserving a functional foot etc. Promised me he would see her and will make back consultation today.  Paradoxically her actual wound on the plantar aspect of her left foot looks really quite good. I had given her 5 days worth of Baxdella to cover her for MRSA. Her MRI came back showing osteomyelitis within the third metatarsal shaft and head and base of the third and fourth proximal phalanx. She had extensive inflammatory changes throughout the soft tissue of the lateral  forefoot. With an ill-defined fluid around the fourth metatarsal extending into the plantar and dorsal soft tissues 09/19/17; the patient is actually on oral Septra and Flagyl. She apparently refused IV vancomycin. She also saw [name] at my request who is planning her for a left BKA sometime in mid July. MRI showed osteomyelitis within the third metatarsal shaft and head and the basis of the third and fourth proximal phalanx. I believe there was felt to be possible septic arthritis involving the third MTP. 09/26/17; the patient went back to Dr. Megan Salon at my suggestion and is now receiving IV daptomycin. Her wound continues to look quite good making the decision to proceed with a transmetatarsal amputation although more difficult for the patient. I believe in my extensive discussions with her she has a good sense of the pros and cons of this. I don't NV the tuft decision she has to make. She has an appointment with [name] I believe in mid July and I previously spoken to him about this issue Has we had used 3 previous Dermagraft. Given the condition of the wound surface I went ahead and added the fourth one today area and I did this not fully realizing that she'll be traveling to [place] next week. I'm hopeful she can come back in 2 weeks 10/21/17; Her same Dermagraft on for about 3-1/2 weeks. In spite of this the wound arrives looking quite healthy. There is been a lot of healing dimensions are smaller. Looking at the square shaped wound she has now there is some undermining and some depth medially under the undermining  although I cannot palpate any bone. No surrounding infection is obvious. She has difficult questions about how to look at this going forward vis--vis amputations versus continued medical therapy. To be truthful the wound is looks so healthy and it is continued to contract. Hard to justify foot surgery at this point although I still told her that I think it might come to that if we are not able to eradicate the underlying MRSA. She is still highly at risk and she understands this 11/06/17 on evaluation today patient appears to be doing better in regard to her foot ulcer. She's been tolerating the dressing changes without complication. Currently she is here for her Dermagraft #6. Her wound continues to make excellent progress at this point. She does not appear to have any evidence of infection which is good news. 11/13/17 on evaluation today patient appears to be doing excellent at this time. She is here for repeat Dermagraft application. This is #7. Overall her wound seems to be making great progress. 12/05/17; the patient arrives with the wound in much better condition than when I last saw this almost 6 weeks ago. She still has a small probing area in the left metatarsal head region on the lateral aspect of her foot. We applied her last Dermagraft today. Since the last time she is here she has what appears to a been a blood blister on the plantar aspect of left foot although I don't see this is threatening. There is also a thick raised tissue on the right mid metatarsal head region. This was not there I don't think the last time she was here 3 weeks ago. 12/12/17; the patient continues to have a small programming area in the left metatarsal head region on the lateral aspect of her foot which was the initial large surgical wound. I applied her last Apligraf last week. I'm going to use Endoform starting today Unfortunately  she has an excoriated area in the left mid foot and the right mid foot. The left  midfoot looks like a blistered area this was not opened last week it certainly is open today. Using silver alginate on these areas. She promises me she is offloading this. 12/19/17; the small probing area in the left metatarsal head eyes think is shallower. In general her original wound looks better. We've been using Endoform. The area inferiorly that I think was trauma last week still requires debridement a lot of nonviable surface which I removed. She still has an open open area distally in her foot Similarly on the right foot there is tightly adherent surface debris which I removed. Still areas that don't look completely epithelialized. This is a small open area. We used silver alginate on these areas 12/26/2017; the patient did not have the supplies we ordered from last week including the Endoform. The original large wound on the left lateral foot looks healthy. She still has the undermining area that is largely unchanged from last week. She has the same heavily callused raised edged wounds on the right mid and left midfoot. Both of these requiring debridement. We have been using silver alginate on these areas 01/02/2018; there is still supply issues. We are going to try to use Prisma but I am not sure she actually got it from what she is saying. She has a new open area on the lateral aspect of the left fourth toe [previous fifth ray amputation]. Still the one tunneling area over the fourth metatarsal head. The area is in the midfoot bilaterally still have thick callus around them. She is concerned about a raised swelling on the lateral aspect of the foot. However she is completely insensate 01/10/2018; we are using Prisma to the wounds on her bilateral feet. Surprisingly the tunneling area over the left fourth metatarsal head that was part of her original surgery has closed down. She has a small open area remaining on the incision line. 2 open areas in the midfoot. 02/10/2018; the patient  arrives back in clinic after a month hiatus. She was traveling to visit family in [place]. Is fairly clear she was not offloading the areas on her feet. The original wound over the left lateral foot at the level of metatarsal heads is reopened and probes medially by about a centimeter or 2. She notes that a week ago she had purulent drainage come out of an area on the left midfoot. Paradoxically the worst area is actually on the right foot is extensive with purulent drainage. We will use silver alginate today 02/17/2018; the patient has 3 wounds one over the left lateral foot. She still has a small area over the metatarsal heads which is the remnant of her original surgical wound. This has medial probing depth of roughly 1.4 cm somewhat better than last week. The area on the right foot is larger. We have been using silver alginate to all areas. The area on the right foot and left foot that we cultured last week showed both Klebsiella and Proteus. Both of these are quinolone sensitive. The patient put her's self on Bactrim and Flagyl that she had left hanging around from prior antibiotic usages. She was apparently on this last week when she arrived. I did not realize this. Unfortunately the Bactrim will not cover either 1 of these organisms. We will send in Cipro 500 twice daily for a week 03/04/2018; the patient has 2 wounds on the left foot one is the  original wound which was a surgical wound for a deep DFU. At one point this had exposed bone. She still has an area over the fourth metatarsal head that probes about 1.4 cm although I think this is better than last week. I been using silver nitrate to try and promote tissue adherence and been using silver alginate here. She also has an area in the left midfoot. This has some depth but a small linear wound. Still requiring debridement. On the right midfoot is a circular wound. A lot of thick callus around this area. We have been using silver alginate  to all wound areas She is completed the ciprofloxacin I gave her 2 weeks ago. 03/11/2018; the patient continues to have 2 open areas on the left foot 1 of which was the original surgical wound for a deep DFU. Only a small probing area remains although this is not much different from last week we have been using silver alginate. The other area is on the midfoot this is smaller linear but still with some depth. We have been using silver alginate here as well On the right foot she has a small circular wound in the mid aspect. This is not much smaller than last time. We have been using silver alginate here as well 03/18/2018; she has 3 wounds on the left foot the original surgical wound, a very superficial wound in the mid aspect and then finally the area in the mid plantar foot. She arrives in today with a very concerning area in the wound in the mid plantar foot which is her most proximal wound. There is undermining here of roughly 1-1/2 cm superiorly. Serosanguineous drainage. She tells me she had some pain on for over the weekend that shot up her foot into her thigh and she tells me that she had a nodule in the groin area. She has the single wound in the right foot. We are using endoform to both wound areas 03/24/2018; the patient arrives with the original surgical wound in the area on the left midfoot about the same as last week. There is a collection of fluid under the surface of the skin extending from the surgical wound towards the midfoot although it does not reach the midfoot wound. The area on the right foot is about the same. Cultures from last week of the left midfoot wound showed abundant Klebsiella abundant Enterococcus faecalis and moderate methicillin resistant staph I gave her Levaquin but this would have only covered the Klebsiella. She will need linezolid 04/01/2018; she is taking linezolid but for the first few days only took 1 a day. I have advised her to finish this at twice  daily dosing. In any case all of her wounds are a lot better especially on the left foot. The original surgical wound is closed. The area on the left midfoot considerably smaller. The area on the right foot also smaller. 04/08/2018; her original surgical wound/osteomyelitis on the left foot remains closed. She has area on the left foot that is in the midfoot area but she had some streaking towards this. This is not connected with her original wound at least not visually. Small wound on the right midfoot appears somewhat smaller. 04/15/18; both wounds looks better. Original wound is better left midfoot. Using silver alginate 1/21; patient states she uses saltwater soak in, stones or remove callus from around her wounds. She is also concerned about a blood blister she had on the left foot but it simply resolved on its own.  We've been using silver alginate 1/28; the patient arrives today with the same streaking area from her metatarsals laterally [the site of her original surgical wound] down to the middle of her foot. There is some drainage in the subcutaneous area here. This concerns me that there is actually continued ongoing infection in the metatarsals probably the fourth and third. This fixates an MRI of the foot without contrast [chronic renal failure] The wound in the mid part of the foot is small but I wonder whether this area actually connects with the more distal foot. The area on the right midfoot is probably about the same. Callus thick skin around the small wound which I removed with a curette we have been using silver alginate on both wound areas 2/4; culture I did of the draining site on the left foot last time grew methicillin sensitive staph aureus. MRI of the left foot showed interval resolution of the findings surrounding the third metatarsal joint on the prior study consistent with treated osteomyelitis. Chronic soft tissue ulceration in the plantar and lateral aspect of the  forefoot without residual focal fluid collection. No evidence of recurrent osteomyelitis. 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 She tells me she had nausea and vomiting over the weekend and missed 2 days. In general her foot looks much the same. She has a small open area just below the left fourth metatarsal head. A linear area in the left midfoot. Some discoloration extending from the inferior part of this into the left lateral foot although this appears to be superficial. She has a small area on the right midfoot which generally looks smaller after debridement 2/18; the patient is completing his cephalexin and has another 2 days. She continues to have open areas on the left and right foot. 2/25; she is now off antibiotics. The area on the left foot at the site of her original surgical wound has closed yet again. She still has open areas in the mid part of her foot however these appear smaller. The area on the right mid foot looks about the same. We have been using silver alginate She tells me she had a serious hypoglycemic spell at home. She had to have EMS called and get IV dextrose 3/3; disappointing on the left lateral foot large area of necrotic tissue surrounding the linear area. This appears to track up towards the same original surgical wound. Required extensive debridement. The area on the right plantar foot is not a lot better also using silver 3/12; the culture I did last time showed abundant enterococcus. I have prescribed Augmentin, should cover any unrecognized anaerobes as well. In addition there were a few MRSA and Serratia that would not be well covered although I did not want to give her multiple antibiotics. She comes in today with  a new wound in the right midfoot this is not connected with the original wound over her MTP a lot of thick callus tissue around both wounds but once again she said she is not walking on these areas 3/17-Patient comes in for follow-up on the bilateral plantar wounds, the right midfoot and the left plantar wound. Both these are heavily callused surrounding the wounds. We are continuing to use silver alginate, she is compliant with offloading  and states she uses a wheelchair fairly often at home 3/24; both wound areas have thick callus. However things actually look quite a bit better here for the majority of her left foot and the right foot. 3/31; patient continues to have thick callused somewhat irritated looking tissue around the wounds which individually are fairly superficial. There is no evidence of surrounding infection. We have been using silver alginate however I change that to [facility] today 4/17; patient returns to clinic after having a scare with Covid she tested negative in her primary doctor's office. She has been using Hydrofera Blue. She does not have an open area on the right foot. On the left foot she has a small open area with the mid area not completely viable. She showed me pictures of what looks like a hemorrhagic blister from several days ago but that seems to have healed over this was on the lateral left foot 4/21; patient comes in to clinic with both her wounds on her feet closed. However over the weekend she started having pain in her right foot and leg up into the thigh. She felt as though she was running a low-grade fever but did not take her temperature. She took a doxycycline that she had leftover and yesterday a single Septra and metronidazole. She thinks things feel somewhat better. 4/28; duplex ultrasound I ordered last week was negative for DVT or superficial thrombophlebitis. She is completed the doxycycline I gave her. States she is still having a lot of pain  in the right calf and right ankle which is no better than last week. She cannot sleep. She also states she has a temperature of up to 101, coughing and complaining of visual loss in her bilateral eyes. Apparently she was tested for Covid 2 weeks ago at [facility] and that was negative. Readmission: 09/03/18 patient presents back for reevaluation after having been evaluated at the end of April regarding erythema and swelling of her right lower extremity. Subsequently she ended up going to the hospital on 07/29/18 and was admitted not to be discharged until 08/08/18. Unfortunately it was noted during the time that she was in the hospital that she did have methicillin-resistant Staphylococcus aureus as the infection noted at the site. It was also determined that she did have osteomyelitis which appears to be fairly significant. She was treated with vancomycin and in fact is still on IV vancomycin at dialysis currently. This is actually slated to continue until 09/12/18 at least which will be the completion of the six weeks of therapy. Nonetheless based on what I'm seeing at this point I'm not sure she will be anywhere near ready to discontinue antibiotics at that time. Since she was released from the hospital she was seen by [name] who is her podiatrist on 08/27/18. His note specifically states that he is recommended that the patient needs of one knee amputation on the right as she has a life-threatening situation that can lead quickly to sepsis. The patient advised she would like to try to save her leg to which [name] apparently told her that this was against all medical advice. She also want to discontinue the Wound VAC which had been initiated due to the fact that she wasn't pleased with how the wound was looking and subsequently she wanted to pursue applying Medihoney at that time. He stated that he did not believe that the right lower extremity was salvageable and that the patient understood but  would still like to attempt hyperbaric  option therapy if it could be of any benefit. She was therefore referred back to [place] for further evaluation. He plans to see her back next week. Upon inspection today patient has a significant amount purulent drainage noted from the wound at this point. The bone in the distal portion of her foot also appears to be extremely necrotic and spongy. When I push down on the bone it bubbles and seeps purulent drainage from deeper in the end of the foot. I do not think that this is likely going to heal very well at all and less aggressive surgical debridement were undertaken more than what I believe we can likely do here in our office. 09/12/2018; I have not seen this patient since the most recent hospitalization although she was in our clinic last week. I have reviewed some of her records from a complex hospitalization. 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 There is situation was complicated by MRSA bacteremia and acute on chronic renal failure now on dialysis. She is receiving vancomycin at dialysis. We started her on Dakin's wet-to- dry last week she is changing this daily. There is still purulent drainage coming out of her foot. Although she is apparently "agreeable" to a below-knee amputation which is been suggested by multiple clinicians she wants this to be done in [place]. She apparently has a telehealth visit with that provider sometime in late [place] 6/24. I have told her I think this is probably too long. Nevertheless I could not convince her to allow a local doctor to perform BKA. 09/19/2018; the patient has a large necrotic area on the right anterior foot. She has had previous transmetatarsal amputations. Culture I did last week showed MRSA nothing else she is on vancomycin at dialysis. She has continued leaking purulent drainage out of the distal part of the large circular wound on the right anterior  foot. She apparently went to see Dr. 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 Somehow that translated into her being referred to plastic surgery for debridement of the area. I gather she basically refused amputation although I do not have a copy of Dr. Berenice Primas notes. The patient really wants to have a trial of hyperbaric oxygen. I agreed with initial assessment in this clinic that this was probably too far along to benefit however if she is going to have plastic surgery I think she would benefit from ancillary hyperbaric oxygen. The issue here is that the patient has benefited as maximally as any patient I have ever seen from hyperbaric oxygen therapy. Most recently she had exposed bone on the lateral part of her left foot after a surgical procedure and that actually has closed. She has eschared areas in both heels but no open area. She is remained systemically well. I am not optimistic that anything can be done about this but the patient is very clear that she wants an attempt. The attempt would include a wound VAC further debridements and hyperbaric oxygen along with IV antibiotics. 6/26; I put her in for a trial of hyperbaric oxygen only because of the dramatic response she has had with wounds on her left midfoot earlier this year which was a surgical wound that went straight to her bone over the metatarsal heads and also remotely the left third toe. We will see if we can get this through our review process and insurance. She arrives in clinic with again purulent material pouring out of necrotic bone  on the top of the foot distally. There is also some concerning erythema on the front of the leg that we marked. It is bit difficult to tell how tender this is because of neuropathy. I note from infectious disease that she had her vancomycin extended. 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 She had the wound VAC on for part of the week.  The rest of the time she is putting various things on this including Medihoney, "ionized water" silver sorb gel etc. 7/7; follow-up along with HBO. She is still on vancomycin at dialysis. She has a large open area on the dorsal right foot and a small dark eschar area on her heel. There is a lot less erythema in the area and a lot less tenderness. From an infection point of view I think this is better. She still has a lot of necrosis in the remaining right forefoot [previous TMA] we are still using the wound VAC in this area 7/16; follow-up along with HBO. I put her on linezolid after she finished her vancomycin. We started this last Friday I gave her 2 weeks worth. I had the expectation that she would be operatively debrided by Dr. Marla Roe but that still has not happened yet. Patient phoned the office this week. She arrives for review today after HBO. The distal part of this wound is completely necrotic. Nonviable pieces of tendon bone was still purulent drainage. Also concerning that she has black eschar over the heel that is expanding. I think this may be indicative of infection in this area as well. She has less erythema and warmth in the ankle and calf but still an abnormal exam 7/21 follow-up along with HBO. I will renew her linezolid after checking a CBC with differential monitoring her blood counts especially her platelets. She was supposed to have surgery yesterday but if I am reading things correctly this was canceled after her blood sugar was found to be over 500. I thought Dr. Marla Roe who called me said that they were sending her to the ER but the patient states that was not the case. 7/28. Follow-up along with HBO. She is on linezolid I still do not have any lab work from dialysis even though I called last week. The patient is concerned about an area on her left lateral foot about the level of the base of her fifth metatarsal. I did not really see anything that ominous here  however this patient is in [place] ability to point out problems that she is sensing and she has been accurate in the past Finally she received a call from Dr. Marla Roe who is referring her to another orthopedic surgeon stating that she is too booked up to take her to the operating room now. Was still using a wound VAC on the foot 8/3 -Follow-up after HBO, she is got another week of linezolid, she is to call ID for an appointment, x-rays of both feet were reviewed, the left foot x-ray with third MTP joint osteo- Right foot x-ray widespread osteo-in the right midfoot Right ankle x-ray does not show any active evidence of infection 8/11-Patient is seen after HBO, the wounds on the right foot appear to be about the same, the heel wound had some necrotic base over tendon that was debrided with a curette 8/21; patient is seen after HBO. The patient's wound on her dorsal foot actually looks reasonably good and there is substantial amount of epithelialization however the open area distally still  has a lot of necrotic debris partially bone. I cannot really get a good sense of just how deep this probes under the foot. She has been pressuring me this week to order medical maggots through a company in [place] for her. The problem I have is there is not a defined wound area here. On the positive side there is no purulence. She has been to see infectious disease she is still on Septra DS although I have not had a chance to review their notes 8/28; patient is seen in conjunction with HBO. The wounds on her foot continued to improve including the right dorsal foot substantially the, the distal part of this wound and the area on the right heel. We have been using a wound VAC over this chronically. She is still on trimethoprim as directed by infectious disease 9/4; patient is seen in conjunction with HBO. Right dorsal foot wound substantially anteriorly is better however she continues to have a deep wound in  the distal part of this that is not responding. We have been using silver collagen under border foam Area on the right plantar medial heel seems better. We have been using Hydrofera Blue 12/12/18 on evaluation today patient appears to be doing about the same with regard to her wound based on prior measurements. She does have some necrotic tissue noted on the lateral aspect of the wound that is going require a little bit of sharp debridement today. This includes what appears to be potentially either severely necrotic bone or tendon. Nonetheless other than that she does not appear to have any severe infection which is good news 9/18; it is been 2 weeks since I saw this wound. She is tolerating HBO well. Continued dramatic improvement in the area on the right dorsal foot. She still has a small wound on the heel that we have been using Hydrofera Blue. She continues with a wound VAC 9/24; patient has to be seen emergently today with a swelling on her right lateral lower leg. She says that she told Dr. Evette Doffing about this and also myself on a couple of occasions but I really have no recollection of this. She is not systemically unwell and her wound really looked good the last time I saw this. She showed this to providers at dialysis and she was able to verify that she was started on cephalexin today for 5 doses at dialysis. She dialyzes on Tuesday Thursday and Saturday. 10/2; patient is seen in conjunction with HBO. The area that is draining on the right anterior medial tibia is more extensive. Copious amounts of serosanguineous drainage with some purulence. We are still using the wound VAC on the original wound then it is stable. Culture I did of the original IandD showed MRSA I contacted dialysis she is now on vancomycin with dialysis treatments. I asked them to run a month 10/9; patient seen in conjunction with HBO. She had a new spontaneous open area just above the wound on the right medial tibia  ankle. More swelling on the right medial tibia. Her wound on the foot looks about the same perhaps slightly better. There is no warmth spreading up her leg but no obvious erythema. her MRI of the foot and ankle and distal tib-fib is not booked for next Friday I discussed this with her in great detail over multiple days. it is likely she has spreading infection upper leg at least involving the distal 25% above the ankle. She knows that if I refer her to orthopedics  for infectious disease they are going to recommend amputation and indeed I am not against this myself. We had a good trial at trying to heal the foot which is what she wanted along with antibiotics debridement and HBO however she clearly has spreading infection [probably staph aureus/MRSA]. Nevertheless she once again tells me she wants to wait the left of the MRI. She still makes comments about having her amputation done in [place]. 10/19; arrives today with significant swelling on the lateral right leg. Last culture I did showed Klebsiella. Multidrug- resistant. Cipro was intermediate sensitivity and that is what I have her on pending her MRI which apparently is going to be done on Thursday this week although this seems to be moving back and forth. She is not systemically unwell. We are using silver alginate on her major wound area on the right medial foot and the draining areas on the right lateral lower leg 10/26; MRI showed extensive abscess in the anterior compartment of the right leg also widespread osteomyelitis involving osseous structures of the midfoot and portions of the hindfoot. Also suspicion for osteomyelitis anterior aspect of the distal medial malleolus. Culture I did of the purulence once again showed a multidrug-resistant Klebsiella. I have been in contact with nephrology late last week and she has been started on cefepime at dialysis to replace the vancomycin We sent a copy of her MRI report to Dr. Geroge Baseman in  [place] who is an orthopedic surgeon. The patient takes great stock in his opinion on this. She says she will go to [place] to have her leg amputated if Dr. Geroge Baseman does not feel there is any salvage options. 11/2; she still is not talk to her orthopedic surgeon in [place]. Apparently he will call her at [phone] this afternoon. The quality of this is she has not allowed me to refer her anywhere. She has been told over and over that she needs this amputated but has not agreed to be referred. She tells me her blood sugar was 600 last night but she has not been febrile. 11/9; she never did got a call from the orthopedic surgeon in [place] therefore that is off the radar. We have arranged to get her see orthopedic surgery at [facility]. She still has a lot of draining purulence coming out of the new abscess in her right leg although that probably came from the osteomyelitis in her right foot and heel. Meanwhile the original wound on the right foot looks very healthy. Continued improvement. The issue is that the last MRI showed osteomyelitis in her right foot extensively she now has an abscess in the right anterior lower leg. There is nobody in [place] who will offer this woman anything but an amputation and to be honest that is probably what she needs. I think she still wants to talk about limb salvage although at this point I just do not see that. She has completed her vancomycin at dialysis which was for the original staph aureus she is still on cefepime for the more recent Klebsiella. She has had a long course of both of these antibiotics which should have benefited the osteomyelitis on the right foot as well as the abscess. 11/16; apparently [place] elective surgery is shut down because of COVID-19 pandemic. I have reached out to some contacts at [facility] to see if we can get her an orthopedic appointment there. I am concerned about continually leaving this but for the moment  everything is static. In fact her original large wound on  this foot is closing down. It is the abscess on the right anterior leg that continues to drain purulent serosanguineous material. She is not currently on any antibiotics however she had a prolonged course of vancomycin [1 month] as well as cefepime for a month 02/24/2019 on evaluation today patient appears to be doing better than the last time I saw her. This is not a patient that I typically see. With that being said I am covering for Dr. Dellia Nims this week and again compared to when I last saw her overall the wounds in particular seem to be doing significantly better which is good news. With that being said the patient tells me several disconcerting things. She has not been able to get in to see anyone for potential debridement in regard to her leg wounds although she tells me that she does not think it is necessary any longer because she is taking care of that herself. She noticed a string coming out of the lower wound on her leg over the last week. The patient states that she subsequently decided that we must of pack something in there and started pulling the string out and as it kept coming and coming she realized this was likely her tendon. With that being said she continued to remove as much of this as she could. She then I subsequently proceeded to using tubes of antibiotic ointment which she will stick down into the wound and then scored as much as she can until she sees it coming out of the other wound opening. She states that in doing this she is actually made things better and there is less redness and irritation. With regard to her foot wound she does have some necrotic tendon and tissue noted in one small corner but again the actual wound itself seems to be doing better with good granulation in general compared to my last evaluation. 12/7; continued improvement in the wound on the substantial part of the right medial foot. Still  a necrotic area inferiorly that required debridement but the rest of this looks very healthy and is contracting. She has 2 wounds on the right lateral leg which were her original drainage sites from her abscess but all of this looks a lot better as well. She has been using silver alginate after putting antibiotic biotic ointment in one wound and watching it come out the other. I have talked to her in some detail today. I had given her names of orthopedic surgeons at [facility] for second opinion on what to do about the right leg. I do not think the patient never called them. She has not been able to get a hold of the orthopedic surgeon in [place] that she had put a lot of faith in as being somebody would give her an opinion that she would trust. I talked to her today and said even if I could get her in to another orthopedic surgeon about the leg which she accept an amputation and she said she would not therefore I am not going to press this issue for the moment 12/14; continued improvement in his substantial wound on the right medial foot. There is still a necrotic area inferiorly with tightly adherent necrotic debris which I have been working on debriding each time she is here. She does not have an orthopedic appointment. Since last time she was here I looked over her cultures which were essentially MRSA on the foot wound and gram- negative rods in the abscess on the anterior leg.  Electronic Signature(s) Signed: 03/16/2019 5:56:50 PM By: Linton Ham MD Entered By: Linton Ham on 03/16/2019 11:45:26 -------------------------------------------------------------------------------- Physical Exam Details Patient Name: Date of Service: [name] 03/16/2019 10:00 AM Medical Record Number:[id number] Patient Account Number: [id number] Date of Birth/Sex: Treating RN: 1970/05/04 (48 y.o. Nancy Fetter Primary Care Provider: [name] Other Clinician: Referring Provider:  Treating Provider/Extender:Marlisha Vanwyk, [name], [name] in Treatment: 27 Notes Wound exam; continued improvement except for the most distal area on the right medial foot. There is no open area on the right calf no tenderness no swelling Electronic Signature(s) Signed: 03/16/2019 5:56:50 PM By: Linton Ham MD Entered By: Linton Ham on 03/16/2019 11:45:53 -------------------------------------------------------------------------------- Physician Orders Details Patient Name: Date of Service: [name] 03/16/2019 10:00 AM Medical Record Number:[id number] Patient Account Number: [id number] Date of Birth/Sex: Treating RN: 09/01/1970 (48 y.o. Nancy Fetter Primary Care Provider: [name] Other Clinician: Referring Provider: Treating Provider/Extender:Adonias Demore, [name], [name] in Treatment: 57 Verbal / Phone Orders: No Diagnosis Coding ICD-10 Coding Code Description M86.671 Other chronic osteomyelitis, right ankle and foot L97.514 Non-pressure chronic ulcer of other part of right foot with necrosis of bone E10.621 Type 1 diabetes mellitus with foot ulcer L02.415 Cutaneous abscess of right lower limb L97.812 Non-pressure chronic ulcer of other part of right lower leg with fat layer exposed Follow-up Appointments Return Appointment in 1 week. Dressing Change Frequency Wound #43 Right,Medial Foot Change dressing three times week. Wound #47 Right,Anterior Lower Leg Change dressing three times week. Wound Cleansing Clean wound with Wound Cleanser - Anasept to all wounds Primary Wound Dressing Wound #43 Right,Medial Foot Calcium Alginate with Silver Wound #47 Right,Anterior Lower Leg Calcium Alginate with Silver Secondary Dressing Wound #43 Right,Medial Foot Kerlix/Rolled [name] ABD pad Wound #47 Right,Anterior Lower Leg Kerlix/Rolled [name] ABD pad Edema Control Elevate legs to the level of the heart or above for 30 minutes daily  and/or when sitting, a frequency of: - throughout the day [facility] for wound care. - Interim Electronic Signature(s) Signed: 03/16/2019 5:56:50 PM By: Linton Ham MD Signed: 03/16/2019 6:02:21 PM By: [name] RN, BSN Entered By: [name] on 03/16/2019 11:06:10 -------------------------------------------------------------------------------- Problem List Details Patient Name: Date of Service: [name] 03/16/2019 10:00 AM Medical Record Number:[id number] Patient Account Number: [id number] Date of Birth/Sex: Treating RN: 07/01/1970 (48 y.o. Nancy Fetter Primary Care Provider: [name] Other Clinician: Referring Provider: Treating Provider/Extender:Kierstynn Babich, [name], [name] in Treatment: 27 Active Problems ICD-10 Evaluated Encounter Code Description Active Date Today Diagnosis M86.671 Other chronic osteomyelitis, right ankle and foot 09/03/2018 No Yes L97.514 Non-pressure chronic ulcer of other part of right foot 09/03/2018 No Yes with necrosis of bone E10.621 Type 1 diabetes mellitus with foot ulcer 09/24/2018 No Yes L02.415 Cutaneous abscess of right lower limb 12/25/2018 No Yes L97.812 Non-pressure chronic ulcer of other part of right lower 02/24/2019 No Yes leg with fat layer exposed Inactive Problems Resolved Problems Electronic Signature(s) Signed: 03/16/2019 5:56:50 PM By: Linton Ham MD Entered By: Linton Ham on 03/16/2019 11:36:04 -------------------------------------------------------------------------------- Progress Note Details Patient Name: Date of Service: [name] 03/16/2019 10:00 AM Medical Record Number:[id number] Patient Account Number: [id number] Date of Birth/Sex: Treating RN: 1971/01/19 (48 y.o. Nancy Fetter Primary Care Provider: [name] Other Clinician: Referring Provider: Treating Provider/Extender:Saraann Enneking, [name], [name]  in Treatment: 27 Subjective History of Present Illness (HPI) 48 year old diabetic who is known to have type 1 diabetes which is poorly controlled last hemoglobin A1c was  11%. She comes in with a ulcerated area on the left lateral foot which has been there for over 6 months. Was recently she has been treated by [name] of podiatry who saw her last on 05/28/2016. Review of his notes revealed that the patient had incision and drainage with placement of antibiotic beads to the left foot on 04/11/2016 for possible osteomyelitis of the cuboid bone. Over the last year she's had a history of amputation of the left fifth toe and a femoropopliteal popliteal bypass graft somewhere in April 2017. 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 His note [name] mentions that the patient has been referred to me for further wound care and possibly great candidate for hyperbaric oxygen therapy due to recurrent osteomyelitis. However we do not have any x-rays of biopsy reports confirming this. He has been on several antibiotics including Bactrim and most recently is on doxycycline for an MRSA. I understand, the patient was not a candidate for IV antibiotics as she has had previous PICC lines which resulted in blood clots in both arms. There was a x-ray report dated 04/04/2016 on [name] notes which showed evidence of fifth ray resection left foot with osteolytic changes noted to the fourth metatarsal and cuboid bone on the left. 06/13/2016 -- had a left foot x-ray which showed no acute fracture or dislocation and no definite radiographic evidence of osteomyelitis. Advanced osteopenia was seen. 06/20/2016 -- she has noticed a new wound on the right plantar foot in the region where she had a callus before. 06/27/16- the patient did have her x-ray of the right foot which showed no findings to suggest osteomyelitis. She saw her endocrinologist, [name], yesterday. Her A1c in January was 11. He also  indicates mismanagement and noncompliance regarding her diabetes. She is currently on Bactrim for a lip infection. She is complaining of nausea, vomiting and diarrhea. She is unable to articulate the exact orders or dosing of the Bactrim; it is unclear when she will complete this. 07/04/2016 -- results from [facility] of ABIs with ankle waveforms were noted from 02/14/2016. The examination done on 06/27/2015 showed noncompressible ABIs with the right being 1.45 and the left being 1.33. The present examination showed a right ABI of 1.19 on the left of 1.33. The conclusion was that right normal ABI in the lower extremity at rest however compared to previous study which was noncompressible ABI may be falsely elevated side suggesting medial calcification. The left ABI suggested medial calcification. 08/01/2016 -- the patient had more redness and pain on her right foot and did not get to come to see as noted she see her PCP or go to the ER and decided to take some leftover metronidazole which she had at home. As usual, the patient does report she feels and is rather noncompliant. 08/08/2016 -- -- x-ray of the right foot -- FINDINGS:Transmetatarsal amputation is noted. No bony destruction is noted to suggest osteomyelitis. IMPRESSION: No evidence of osteomyelitis. Postsurgical changes are seen. MRI would be more sensitive for possible bony changes. Culture has grown Serratia Marcescens -- sensitive to Bactrim, ciprofloxacin, ceftazidime she was seen by [name] on 08/06/2016. He did not find any exposed bone, muscle, tendon, ligament or joint. There was no malodor and he did a excisional debridement in the office. ============ Old notes: 48 year old patient who is known to the wound clinic for a while had been away from the wound clinic since 09/01/2014. Over the last several months she has been  admitted to various hospitals including [place] at [facility]. She was treated for a right  metatarsal osteomyelitis with a transmetatarsal amputation and this was done about 2 months ago. He has a small ulcerated area on the right heel and she continues to have an ulcerated area on the left plantar aspect of the foot. The patient was recently admitted to the [facility] hospital group between 7/12 and 10/18/2014. she was given 3 weeks of IV vancomycin and was to follow-up with her surgeons at [facility] and also took oral vancomycin for C. difficile colitis. 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 MRI of the left foot done on 10/14/2014 showed no abscess or osteomyelitis. 04/27/15; this is a patient we know from previous stays in the wound care center. She is a type I diabetic I am not sure of her control currently. Since the last time I saw her she is had a right transmetatarsal amputation and has no wounds on her right foot and has no open wounds. She is been followed at the wound care center at [facility] in [place]. She comes today with the desire to undergo hyperbaric treatment locally. Apparently one of her wound care providers in [place] has suggested hyperbarics. This is in response to an MRI from 04/18/15 that showed increased marrow signal and loss of the proximal fifth metatarsal cortex evidence of osteomyelitis with likely early osteomyelitis in the cuboid bone as well. She has a large wound over the base of the fifth metatarsal. She also has a eschar over her the tips of her toes on 1,3 and 5. She does not have peripheral pulses and apparently is going for an angiogram tomorrow which seems reasonable. After this she is going to infectious disease at [facility]. They have been using  Medihoney to the large wound on the lateral aspect of the left foot to. The patient has known Charcot deformity from diabetic neuropathy. She also has known diabetic PAD. Surprisingly I can't see that she has had any recent antibiotics, the patient states the last antibiotic she had was at the end of November for 10 days. I think this was in response to culture that showed group G strep although I'm not exactly sure where the culture was from. She is also had arterial studies on 03/29/15. This showed a right ABI of 1.4 that was noncompressible. Her left ABI was 0.73. 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 It was not felt that arterial inflow was adequate for healing of a foot ulcer. Her Doppler waveforms looked monophasic ===== READMISSION 02/28/17; this is in an now 48 year old woman we've had at several different occasions in this clinic. 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 She has a remote ex-smoker. She was last seen in this clinic by Dr. Con Memos I think in May. More recently she is been followed by her podiatrist [name] an infectious disease Dr. Megan Salon. She has 2 open wounds the major one is over the right first metatarsal head she also has a wound on the left plantar foot. an MRI of the right foot on 01/01/17 showed a soft tissue ulcer along the plantar aspect of the first metatarsal base consistent with osteomyelitis of the first metatarsal stump. Dr. Megan Salon  feels that she has polymicrobial subacute to chronic osteomyelitis of the right first metatarsal stump. According to the patient this is been open for slightly over a month. She has been on a combination of Cipro 500 twice a day, Zyvox 600 twice a day and Flagyl 500 3 times a day for over a month now as directed by Dr. Megan Salon. cultures of the right foot earlier this year showed MRSA in January and Serratia in May. January also had a few viridans  strep. Recent x-rays of both feet were done and [name] office and I don't have these reports. The patient has known PAD and has a history of aleft femoropopliteal bypass in April 2017. 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 Dwyane Dee. In spite of this the last hemoglobin A1c I can see is 10.1 on 01/01/2017. She is being referred by 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 She is been using Medihoney to this area and also an area on the left midfoot. She is using healing sandals bilaterally. ABIs in this clinic at the left posterior tibial was 1.1 noncompressible on the right READMISSION Non invasive vascular NOVANT 5/18 Aftercare following surgery of the circulatory system Procedure Note - Interface, External Ris In - 08/13/2016 11:05 AM EDT Procedure: Examination consists of physiologic resting arterial pressures of the brachial and ankle arteries bilaterally with continuous wave Doppler waveform analysis. Previous: Previous exam performed on 02/14/16 demonstrated ABIs of Rt = 1.19 and Lt = 1.33. Right: ABI = non-compressible PT, 1.47 DP. S/P transmet amputation. Left: ABI = 1.52, 2nd digit pressure = 87 mmHg Conclusions: Right: ABI (>1.3) may be falsely elevated, suggesting medial calcification. Left: ABI (>1.3) may be falsely elevated, suggesting medial calcification The patient is a now 48 year old type I diabetic is had multiple issues her graded to chronic diabetic foot ulcers. 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 We had her in the clinic lastin November. At that point she had wounds on her bilateral feet.she had wanted to try hyperbarics however the  healogics review process denied her because she hadn't followed up with her vascular surgeon for her left femoropopliteal bypass. The bypass was done by Dr. Raul Del at [facility]. We made her a follow-up with Dr. Raul Del however she did not keep the appointment and therefore she was not approved The patient shows me a small wound on her left fourth metatarsal head on her phone. She developed rapid discoloration in the plantar aspect of the left foot and she was admitted to hospital from 2/2 through 05/10/17 with wet gangrene of the left foot osteomyelitis of the fourth metatarsal heads. She was admitted acutely ill with a temperature of 103. She was started on broad-spectrum vancomycin and cefepime. On 05/06/17 she was taken to the OR by [name] her podiatric surgeon for an incision and drainage irrigation of the left foot wound. Cultures from this surgery revealed group be strep and anaerobes. she was seen by Dr.Xu of orthopedic surgery and scheduled for a below-knee amputation which she u refused. Ultimately she was discharged on Levaquin and Flagyl for one month. MRI 05/05/17 done while she was in the hospital showed abscess adjacent to the fourth metatarsal head and neck  small abscess around the fourth flexor tendon. Inflammatory phlegmon and gas in the soft tissues along the lateral aspect of the fourth phalanx. Findings worrisome for osteomyelitis involving the fourth proximal and middle phalanx and also the third and fourth metatarsals. Finally the patient had actually shortly before this followed up with Dr. Raul Del at no time on 04/29/17. He felt that her left femoropopliteal bypass was patent he felt that her left-sided toe pressures more than adequate for healing a wound on the left foot. This was before her acute presentation. 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 05/28/17; she is started hyperbarics. The patient tells me that for some reason she was not actually on Levaquin but I  think on ciprofloxacin. She was on Flagyl. She only started her Levaquin yesterday due to some difficulty with the pharmacy and perhaps her sister picking it up. She has an appointment with [name] tomorrow and with infectious disease early next week. She has no new complaints 06/06/17; the patient continues in hyperbarics. She saw [name] on 05/29/17 who is her podiatric surgeon. He is elected for a transmetatarsal amputation on 06/27/17. I'm not sure at what level he plans to do this amputation. The patient is unaware ooShe also saw Dr. Megan Salon of infectious disease who elected to continue her on current antibiotics I think this is ciprofloxacin and Flagyl. I'll need to clarify with her tomorrow if she actually has this. We're using silver alginate to the actual wound. Necrotic surface today with material under the flap of her foot. ooOriginal MRI showed abscesses as well as osteomyelitis of the proximal and middle fourth phalanx and the third and fourth metatarsal heads 06/11/17; patient continues in hyperbarics and continues on oral antibiotics. She is doing well. The wound looks better. The necrotic part of this under the flap in her superior foot also looks better. she is been to see [name]. I haven't had a chance to look at his note. Apparently he has put the transmetatarsal amputation on hold her request it is still planning to take her to the OR for debridement and product application ACEL. I'll see if I can find his note. I'll therefore leave product ordering/requests to [name] for now. I was going to look at Dermagraft 06/18/17-she is here in follow-up evaluation for bilateral foot wounds. She continues with hyperbaric therapy. She states she has been applying manuka honey to the right plantar foot and alternate manuka honey and silver alginate to the left foot, despite our orders. We will continue with same treatment plan and she will follo up next week. 06/25/17; I have reviewed  [name] last note from 3/11. She has operative debridement in 2 days' time. By review his note apparently they're going to place there is skin over the majority of this wound which is a good choice. She has a small satellite area at the most proximal part of this wound on the left plantar foot. The area on the right plantar foot we've been using silver alginate and it is close to healing. 07/02/17; unfortunately the patient was not easily approved for [name] proposed surgery. I'm not completely certain what the issue is. She has been using silver alginate to the wound she has completed a first course of hyperbarics. She is still on Levaquin and Flagyl. I have really lost track of the time course here.I suspect she should have another week to 2 of antibiotics. I'll need to see if she is followed up with infectious disease Dr.  Megan Salon 07/09/17; the patient is followed up with Dr. Megan Salon. 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 She continues on Levaquin and metronidazole continuing both of these for now I think she is been on fr about 6 weeks. She still has some drainage but no pain. No fever. Her had been plans for her to go to the OR for operative debridement with her podiatrist [name], I am not exactly sure where that is. I'll probably slip a note to [name] today. I note that she follows with Dr. Dwyane Dee of endocrinology. We have her recertified for hyperbaric oxygen. I have not heard about Dermagraft however I'll see if [name] is planning a skin substitute as well 07/16/17; the patient tells me she is just about out of [place]. I'll need to check Dr. Hale Bogus last notes on this. She states she has plenty of Flagyl however. She comes in today complaining of pain in the right lateral foot which she said lasted for about a day. The wound on the right foot is actually much more medially. She also tells me that the [facility] cost a lot of  pain in the left foot wound and she turned back to silver alginate. Finally Dermagraft has a $338 per application co-pay. She cannot afford this 07/23/17; patient arrives today with the wound not much smaller. There is not much new to add. She has not heard from [name] all try to put in a call to them today. She was asking about Dermagraft again and she has an over $250 per application co-pay she states that she would be willing to try to do a payment plan. I been tried to avoid this. We've been using silver alginate, I'll change to [facility] 07/30/17-She is here in follow-up evaluation for left foot ulcer. She continues hyperbaric medicine. The left foot ulcer is stable we will continue with same treatment plan 08/06/17; she is here for evaluation of her left foot ulcer. Currently being treated for hyperbarics or underlying osteomyelitis. She is completed antibiotics. The left foot ulcer is better smaller with healthier looking granulation. For various reasons I am not really clear on we never got her back to the OR with [name]. He did not respond to my secure text message. Nevertheless I think that surgery on this point is not necessary nor am I completely clear that a skin substitute is necessary The patient is complaining about pain on the outside of her right foot. 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 There is no erythema. She also states the foot is warm versus her other part of her upper leg and this is largely true. It is not totally clear to me what's causing this. She thinks it's different from her usual neuropathy pain 08/13/17; she arrives in clinic today with a small wound which is superficial on her right first metatarsal head. 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 She tells [place] she was up on her feet over the Mother's Day celebration. ooThe large wound is on the left foot. Continues with hyperbarics for underlying osteomyelitis. We're  using Hydrofera Blue. She asked me today about where we were with Dermagraft. I had actually excluded this because of the co- pay however she wants to assume this therefore I'll recheck the co-pay an order for next week. 08/20/17; the patient agreed to accept the co-pay of the first Apligraf which we applied today. She is disappointed she  is finishing hyperbarics will run this through the insurance on the extent of the foot infection and the extent of the wound that she had however she is already had 60 dive's. Dermagraft No. 1 08/27/17; Dermagraft No. 2. She is not eligible for any more hyperbaric treatments this month. She reports a fair amount of drainage and she actually changed to the external dressings without disturbing the direct contact layer 09/03/17; the patient arrived in clinic today with the wound superficially looking quite healthy. Nice vibrant red tissue with some advancing epithelialization although not as much adherence of the flap as I might like. However she noted on her own fourth toe some bogginess and she brought that to our attention. Indeed this was boggy feeling like a possibility of subcutaneous fluid. 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 She is not been unwell. We've been using Dermagraft 09/10/17; the culture that I did not last week was MRSA. She saw Dr. Megan Salon this morning who is going to start her on vancomycin. I had sent him a secure a text message yesterday. I also spoke with her podiatric surgeon [name] about surgery on this foot the options for conserving a functional foot etc. Promised me he would see her and will make back consultation today. Paradoxically her actual wound on the plantar aspect of her left foot looks really quite good. I had given her 5 days worth of Baxdella to cover her for MRSA. Her MRI came back showing osteomyelitis within the third metatarsal shaft and head  and base of the third and fourth proximal phalanx. She had extensive inflammatory changes throughout the soft tissue of the lateral forefoot. With an ill-defined fluid around the fourth metatarsal extending into the plantar and dorsal soft tissues 09/19/17; the patient is actually on oral Septra and Flagyl. She apparently refused IV vancomycin. She also saw [name] at my request who is planning her for a left BKA sometime in mid July. MRI showed osteomyelitis within the third metatarsal shaft and head and the basis of the third and fourth proximal phalanx. I believe there was felt to be possible septic arthritis involving the third MTP. 09/26/17; the patient went back to Dr. Megan Salon at my suggestion and is now receiving IV daptomycin. Her wound continues to look quite good making the decision to proceed with a transmetatarsal amputation although more difficult for the patient. I believe in my extensive discussions with her she has a good sense of the pros and cons of this. I don't NV the tuft decision she has to make. She has an appointment with [name] I believe in mid July and I previously spoken to him about this issue Has we had used 3 previous Dermagraft. Given the condition of the wound surface I went ahead and added the fourth one today area and I did this not fully realizing that she'll be traveling to [place] next week. I'm hopeful she can come back in 2 weeks 10/21/17; Her same Dermagraft on for about 3-1/2 weeks. In spite of this the wound arrives looking quite healthy. There is been a lot of healing dimensions are smaller. Looking at the square shaped wound she has now there is some undermining and some depth medially under the undermining although I cannot palpate any bone. No surrounding infection is obvious. She has difficult questions about how to look at this going forward vis--vis amputations versus continued medical  therapy. To be truthful the wound is looks so healthy and  it is continued to contract. Hard to justify foot surgery at this point although I still told her that I think it might come to that if we are not able to eradicate the underlying MRSA. She is still highly at risk and she understands this 11/06/17 on evaluation today patient appears to be doing better in regard to her foot ulcer. She's been tolerating the dressing changes without complication. Currently she is here for her Dermagraft #6. Her wound continues to make excellent progress at this point. She does not appear to have any evidence of infection which is good news. 11/13/17 on evaluation today patient appears to be doing excellent at this time. She is here for repeat Dermagraft application. This is #7. Overall her wound seems to be making great progress. 12/05/17; the patient arrives with the wound in much better condition than when I last saw this almost 6 weeks ago. She still has a small probing area in the left metatarsal head region on the lateral aspect of her foot. We applied her last Dermagraft today. ooSince the last time she is here she has what appears to a been a blood blister on the plantar aspect of left foot although I don't see this is threatening. There is also a thick raised tissue on the right mid metatarsal head region. This was not there I don't think the last time she was here 3 weeks ago. 12/12/17; the patient continues to have a small programming area in the left metatarsal head region on the lateral aspect of her foot which was the initial large surgical wound. I applied her last Apligraf last week. I'm going to use Endoform starting today ooUnfortunately she has an excoriated area in the left mid foot and the right mid foot. The left midfoot looks like a blistered area this was not opened last week it certainly is open today. Using silver alginate on these areas. She promises me she is offloading this. 12/19/17; the small probing area in the left metatarsal head eyes  think is shallower. In general her original wound looks better. We've been using Endoform. The area inferiorly that I think was trauma last week still requires debridement a lot of nonviable surface which I removed. She still has an open open area distally in her foot ooSimilarly on the right foot there is tightly adherent surface debris which I removed. Still areas that don't look completely epithelialized. This is a small open area. We used silver alginate on these areas 12/26/2017; the patient did not have the supplies we ordered from last week including the Endoform. The original large wound on the left lateral foot looks healthy. She still has the undermining area that is largely unchanged from last week. She has the same heavily callused raised edged wounds on the right mid and left midfoot. Both of these requiring debridement. We have been using silver alginate on these areas 01/02/2018; there is still supply issues. We are going to try to use Prisma but I am not sure she actually got it from what she is saying. She has a new open area on the lateral aspect of the left fourth toe [previous fifth ray amputation]. Still the one tunneling area over the fourth metatarsal head. The area is in the midfoot bilaterally still have thick callus around them. She is concerned about a raised swelling on the lateral aspect of the foot. However she is completely insensate 01/10/2018; we are  using Prisma to the wounds on her bilateral feet. Surprisingly the tunneling area over the left fourth metatarsal head that was part of her original surgery has closed down. She has a small open area remaining on the incision line. 2 open areas in the midfoot. 02/10/2018; the patient arrives back in clinic after a month hiatus. She was traveling to visit family in [place]. Is fairly clear she was not offloading the areas on her feet. The original wound over the left lateral foot at the level of metatarsal heads is  reopened and probes medially by about a centimeter or 2. She notes that a week ago she had purulent drainage come out of an area on the left midfoot. Paradoxically the worst area is actually on the right foot is extensive with purulent drainage. We will use silver alginate today 02/17/2018; the patient has 3 wounds one over the left lateral foot. She still has a small area over the metatarsal heads which is the remnant of her original surgical wound. This has medial probing depth of roughly 1.4 cm somewhat better than last week. The area on the right foot is larger. We have been using silver alginate to all areas. The area on the right foot and left foot that we cultured last week showed both Klebsiella and Proteus. Both of these are quinolone sensitive. The patient put her's self on Bactrim and Flagyl that she had left hanging around from prior antibiotic usages. She was apparently on this last week when she arrived. I did not realize this. Unfortunately the Bactrim will not cover either 1 of these organisms. We will send in Cipro 500 twice daily for a week 03/04/2018; the patient has 2 wounds on the left foot one is the original wound which was a surgical wound for a deep DFU. At one point this had exposed bone. She still has an area over the fourth metatarsal head that probes about 1.4 cm although I think this is better than last week. I been using silver nitrate to try and promote tissue adherence and been using silver alginate here. ooShe also has an area in the left midfoot. This has some depth but a small linear wound. Still requiring debridement. ooOn the right midfoot is a circular wound. A lot of thick callus around this area. ooWe have been using silver alginate to all wound areas ooShe is completed the ciprofloxacin I gave her 2 weeks ago. 03/11/2018; the patient continues to have 2 open areas on the left foot 1 of which was the original surgical wound for a deep DFU. Only a small  probing area remains although this is not much different from last week we have been using silver alginate. The other area is on the midfoot this is smaller linear but still with some depth. We have been using silver alginate here as well ooOn the right foot she has a small circular wound in the mid aspect. This is not much smaller than last time. We have been using silver alginate here as well 03/18/2018; she has 3 wounds on the left foot the original surgical wound, a very superficial wound in the mid aspect and then finally the area in the mid plantar foot. She arrives in today with a very concerning area in the wound in the mid plantar foot which is her most proximal wound. There is undermining here of roughly 1-1/2 cm superiorly. Serosanguineous drainage. She tells me she had some pain on for over the weekend that shot up her  foot into her thigh and she tells me that she had a nodule in the groin area. ooShe has the single wound in the right foot. ooWe are using endoform to both wound areas 03/24/2018; the patient arrives with the original surgical wound in the area on the left midfoot about the same as last week. There is a collection of fluid under the surface of the skin extending from the surgical wound towards the midfoot although it does not reach the midfoot wound. The area on the right foot is about the same. Cultures from last week of the left midfoot wound showed abundant Klebsiella abundant Enterococcus faecalis and moderate methicillin resistant staph I gave her Levaquin but this would have only covered the Klebsiella. She will need linezolid 04/01/2018; she is taking linezolid but for the first few days only took 1 a day. I have advised her to finish this at twice daily dosing. In any case all of her wounds are a lot better especially on the left foot. The original surgical wound is closed. The area on the left midfoot considerably smaller. The area on the right foot also  smaller. 04/08/2018; her original surgical wound/osteomyelitis on the left foot remains closed. She has area on the left foot that is in the midfoot area but she had some streaking towards this. This is not connected with her original wound at least not visually. ooSmall wound on the right midfoot appears somewhat smaller. 04/15/18; both wounds looks better. Original wound is better left midfoot. Using silver alginate 1/21; patient states she uses saltwater soak in, stones or remove callus from around her wounds. She is also concerned about a blood blister she had on the left foot but it simply resolved on its own. We've been using silver alginate 1/28; the patient arrives today with the same streaking area from her metatarsals laterally [the site of her original surgical wound] down to the middle of her foot. There is some drainage in the subcutaneous area here. This concerns me that there is actually continued ongoing infection in the metatarsals probably the fourth and third. This fixates an MRI of the foot without contrast [chronic renal failure] ooThe wound in the mid part of the foot is small but I wonder whether this area actually connects with the more distal foot. ooThe area on the right midfoot is probably about the same. Callus thick skin around the small wound which I removed with a curette we have been using silver alginate on both wound areas 2/4; culture I did of the draining site on the left foot last time grew methicillin sensitive staph aureus. MRI of the left foot showed interval resolution of the findings surrounding the third metatarsal joint on the prior study consistent with treated osteomyelitis. Chronic soft tissue ulceration in the plantar and lateral aspect of the forefoot without residual focal fluid collection. No evidence of recurrent osteomyelitis. Noted to have the previous amputation of the distal first phalanx and fifth ray MRI of the right foot showed no  evidence of osteomyelitis I am going to treat the patient with a prolonged course of antibiotics directed against MSSA in the left foot 2/11; patient continues on cephalexin. She tells me she had nausea and vomiting over the weekend and missed 2 days. In general her foot looks much the same. She has a small open area just below the left fourth metatarsal head. A linear area in the left midfoot. Some discoloration extending from the inferior part of this into the left  lateral foot although this appears to be superficial. She has a small area on the right midfoot which generally looks smaller after debridement 2/18; the patient is completing his cephalexin and has another 2 days. She continues to have open areas on the left and right foot. 2/25; she is now off antibiotics. The area on the left foot at the site of her original surgical wound has closed yet again. She still has open areas in the mid part of her foot however these appear smaller. The area on the right mid foot looks about the same. We have been using silver alginate She tells me she had a serious hypoglycemic spell at home. She had to have EMS called and get IV dextrose 3/3; disappointing on the left lateral foot large area of necrotic tissue surrounding the linear area. This appears to track up towards the same original surgical wound. Required extensive debridement. The area on the right plantar foot is not a lot better also using silver 3/12; the culture I did last time showed abundant enterococcus. I have prescribed Augmentin, should cover any unrecognized anaerobes as well. In addition there were a few MRSA and Serratia that would not be well covered although I did not want to give her multiple antibiotics. She comes in today with a new wound in the right midfoot this is not connected with the original wound over her MTP a lot of thick callus tissue around both wounds but once again she said she is not walking on these  areas 3/17-Patient comes in for follow-up on the bilateral plantar wounds, the right midfoot and the left plantar wound. Both these are heavily callused surrounding the wounds. We are continuing to use silver alginate, she is compliant with offloading and states she uses a wheelchair fairly often at home 3/24; both wound areas have thick callus. However things actually look quite a bit better here for the majority of her left foot and the right foot. 3/31; patient continues to have thick callused somewhat irritated looking tissue around the wounds which individually are fairly superficial. There is no evidence of surrounding infection. We have been using silver alginate however I change that to [facility] today 4/17; patient returns to clinic after having a scare with Covid she tested negative in her primary doctor's office. She has been using Hydrofera Blue. She does not have an open area on the right foot. On the left foot she has a small open area with the mid area not completely viable. She showed me pictures of what looks like a hemorrhagic blister from several days ago but that seems to have healed over this was on the lateral left foot 4/21; patient comes in to clinic with both her wounds on her feet closed. However over the weekend she started having pain in her right foot and leg up into the thigh. She felt as though she was running a low-grade fever but did not take her temperature. She took a doxycycline that she had leftover and yesterday a single Septra and metronidazole. She thinks things feel somewhat better. 4/28; duplex ultrasound I ordered last week was negative for DVT or superficial thrombophlebitis. She is completed the doxycycline I gave her. States she is still having a lot of pain in the right calf and right ankle which is no better than last week. She cannot sleep. She also states she has a temperature of up to 101, coughing and complaining of visual loss in her  bilateral eyes. Apparently she was  tested for Covid 2 weeks ago at [facility] and that was negative. Readmission: 09/03/18 patient presents back for reevaluation after having been evaluated at the end of April regarding erythema and swelling of her right lower extremity. Subsequently she ended up going to the hospital on 07/29/18 and was admitted not to be discharged until 08/08/18. Unfortunately it was noted during the time that she was in the hospital that she did have methicillin-resistant Staphylococcus aureus as the infection noted at the site. It was also determined that she did have osteomyelitis which appears to be fairly significant. She was treated with vancomycin and in fact is still on IV vancomycin at dialysis currently. This is actually slated to continue until 09/12/18 at least which will be the completion of the six weeks of therapy. Nonetheless based on what I'm seeing at this point I'm not sure she will be anywhere near ready to discontinue antibiotics at that time. Since she was released from the hospital she was seen by [name] who is her podiatrist on 08/27/18. His note specifically states that he is recommended that the patient needs of one knee amputation on the right as she has a life-threatening situation that can lead quickly to sepsis. The patient advised she would like to try to save her leg to which [name] apparently told her that this was against all medical advice. She also want to discontinue the Wound VAC which had been initiated due to the fact that she wasn't pleased with how the wound was looking and subsequently she wanted to pursue applying Medihoney at that time. He stated that he did not believe that the right lower extremity was salvageable and that the patient understood but would still like to attempt hyperbaric option therapy if it could be of any benefit. She was therefore referred back to [place] for further evaluation. He plans to see her back next week. Upon  inspection today patient has a significant amount purulent drainage noted from the wound at this point. The bone in the distal portion of her foot also appears to be extremely necrotic and spongy. When I push down on the bone it bubbles and seeps purulent drainage from deeper in the end of the foot. I do not think that this is likely going to heal very well at all and less aggressive surgical debridement were undertaken more than what I believe we can likely do here in our office. 09/12/2018; I have not seen this patient since the most recent hospitalization although she was in our clinic last week. I have reviewed some of her records from a complex hospitalization. 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 There is situation was complicated by MRSA bacteremia and acute on chronic renal failure now on dialysis. She is receiving vancomycin at dialysis. We started her on Dakin's wet-to- dry last week she is changing this daily. There is still purulent drainage coming out of her foot. Although she is apparently "agreeable" to a below-knee amputation which is been suggested by multiple clinicians she wants this to be done in [place]. She apparently has a telehealth visit with that provider sometime in late [place] 6/24. I have told her I think this is probably too long. Nevertheless I could not convince her to allow a local doctor to perform BKA. 09/19/2018; the patient has a large necrotic area on the right anterior foot. She has had previous transmetatarsal amputations. Culture I did last week showed MRSA nothing else  she is on vancomycin at dialysis. She has continued leaking purulent drainage out of the distal part of the large circular wound on the right anterior foot. She apparently went to see Dr. 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 Somehow that translated into her being referred to plastic surgery for  debridement of the area. I gather she basically refused amputation although I do not have a copy of Dr. Berenice Primas notes. The patient really wants to have a trial of hyperbaric oxygen. I agreed with initial assessment in this clinic that this was probably too far along to benefit however if she is going to have plastic surgery I think she would benefit from ancillary hyperbaric oxygen. The issue here is that the patient has benefited as maximally as any patient I have ever seen from hyperbaric oxygen therapy. Most recently she had exposed bone on the lateral part of her left foot after a surgical procedure and that actually has closed. She has eschared areas in both heels but no open area. She is remained systemically well. I am not optimistic that anything can be done about this but the patient is very clear that she wants an attempt. The attempt would include a wound VAC further debridements and hyperbaric oxygen along with IV antibiotics. 6/26; I put her in for a trial of hyperbaric oxygen only because of the dramatic response she has had with wounds on her left midfoot earlier this year which was a surgical wound that went straight to her bone over the metatarsal heads and also remotely the left third toe. We will see if we can get this through our review process and insurance. She arrives in clinic with again purulent material pouring out of necrotic bone on the top of the foot distally. There is also some concerning erythema on the front of the leg that we marked. It is bit difficult to tell how tender this is because of neuropathy. I note from infectious disease that she had her vancomycin extended. 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 She had the wound VAC on for part of the week. The rest of the time she is putting various things on this including Medihoney, "ionized water" silver sorb gel etc. 7/7; follow-up along with HBO. She is still on vancomycin at  dialysis. She has a large open area on the dorsal right foot and a small dark eschar area on her heel. There is a lot less erythema in the area and a lot less tenderness. From an infection point of view I think this is better. She still has a lot of necrosis in the remaining right forefoot [previous TMA] we are still using the wound VAC in this area 7/16; follow-up along with HBO. I put her on linezolid after she finished her vancomycin. We started this last Friday I gave her 2 weeks worth. I had the expectation that she would be operatively debrided by Dr. Marla Roe but that still has not happened yet. Patient phoned the office this week. She arrives for review today after HBO. The distal part of this wound is completely necrotic. Nonviable pieces of tendon bone was still purulent drainage. Also concerning that she has black eschar over the heel that is expanding. I think this may be indicative of infection in this area as well. She has less erythema and warmth in the ankle and calf but still an abnormal exam 7/21 follow-up along with HBO. I will renew her linezolid after  checking a CBC with differential monitoring her blood counts especially her platelets. She was supposed to have surgery yesterday but if I am reading things correctly this was canceled after her blood sugar was found to be over 500. I thought Dr. Marla Roe who called me said that they were sending her to the ER but the patient states that was not the case. 7/28. Follow-up along with HBO. She is on linezolid I still do not have any lab work from dialysis even though I called last week. The patient is concerned about an area on her left lateral foot about the level of the base of her fifth metatarsal. I did not really see anything that ominous here however this patient is in [place] ability to point out problems that she is sensing and she has been accurate in the past Finally she received a call from Dr. Marla Roe who is  referring her to another orthopedic surgeon stating that she is too booked up to take her to the operating room now. Was still using a wound VAC on the foot 8/3 -Follow-up after HBO, she is got another week of linezolid, she is to call [facility] for an appointment, x-rays of both feet were reviewed, the left foot x-ray with third MTP joint osteo- Right foot x-ray widespread osteo-in the right midfoot Right ankle x-ray does not show any active evidence of infection 8/11-Patient is seen after HBO, the wounds on the right foot appear to be about the same, the heel wound had some necrotic base over tendon that was debrided with a curette 8/21; patient is seen after HBO. The patient's wound on her dorsal foot actually looks reasonably good and there is substantial amount of epithelialization however the open area distally still has a lot of necrotic debris partially bone. I cannot really get a good sense of just how deep this probes under the foot. She has been pressuring me this week to order medical maggots through a company in [place] for her. The problem I have is there is not a defined wound area here. On the positive side there is no purulence. She has been to see infectious disease she is still on Septra DS although I have not had a chance to review their notes 8/28; patient is seen in conjunction with HBO. The wounds on her foot continued to improve including the right dorsal foot substantially the, the distal part of this wound and the area on the right heel. We have been using a wound VAC over this chronically. She is still on trimethoprim as directed by infectious disease 9/4; patient is seen in conjunction with HBO. Right dorsal foot wound substantially anteriorly is better however she continues to have a deep wound in the distal part of this that is not responding. We have been using silver collagen under border foam ooArea on the right plantar medial heel seems better. We have been using  Hydrofera Blue 12/12/18 on evaluation today patient appears to be doing about the same with regard to her wound based on prior measurements. She does have some necrotic tissue noted on the lateral aspect of the wound that is going require a little bit of sharp debridement today. This includes what appears to be potentially either severely necrotic bone or tendon. Nonetheless other than that she does not appear to have any severe infection which is good news 9/18; it is been 2 weeks since I saw this wound. She is tolerating HBO well. Continued dramatic improvement in the area on  the right dorsal foot. She still has a small wound on the heel that we have been using Hydrofera Blue. She continues with a wound VAC 9/24; patient has to be seen emergently today with a swelling on her right lateral lower leg. She says that she told Dr. Evette Doffing about this and also myself on a couple of occasions but I really have no recollection of this. She is not systemically unwell and her wound really looked good the last time I saw this. She showed this to providers at dialysis and she was able to verify that she was started on cephalexin today for 5 doses at dialysis. She dialyzes on Tuesday Thursday and Saturday. 10/2; patient is seen in conjunction with HBO. The area that is draining on the right anterior medial tibia is more extensive. Copious amounts of serosanguineous drainage with some purulence. We are still using the wound VAC on the original wound then it is stable. Culture I did of the original IandD showed MRSA I contacted dialysis she is now on vancomycin with dialysis treatments. I asked them to run a month 10/9; patient seen in conjunction with HBO. She had a new spontaneous open area just above the wound on the right medial tibia ankle. More swelling on the right medial tibia. Her wound on the foot looks about the same perhaps slightly better. There is no warmth spreading up her leg but no obvious  erythema. her MRI of the foot and ankle and distal tib-fib is not booked for next Friday I discussed this with her in great detail over multiple days. it is likely she has spreading infection upper leg at least involving the distal 25% above the ankle. She knows that if I refer her to orthopedics for infectious disease they are going to recommend amputation and indeed I am not against this myself. We had a good trial at trying to heal the foot which is what she wanted along with antibiotics debridement and HBO however she clearly has spreading infection [probably staph aureus/MRSA]. Nevertheless she once again tells me she wants to wait the left of the MRI. She still makes comments about having her amputation done in [place]. 10/19; arrives today with significant swelling on the lateral right leg. Last culture I did showed Klebsiella. Multidrug- resistant. Cipro was intermediate sensitivity and that is what I have her on pending her MRI which apparently is going to be done on Thursday this week although this seems to be moving back and forth. She is not systemically unwell. We are using silver alginate on her major wound area on the right medial foot and the draining areas on the right lateral lower leg 10/26; MRI showed extensive abscess in the anterior compartment of the right leg also widespread osteomyelitis involving osseous structures of the midfoot and portions of the hindfoot. Also suspicion for osteomyelitis anterior aspect of the distal medial malleolus. Culture I did of the purulence once again showed a multidrug-resistant Klebsiella. I have been in contact with nephrology late last week and she has been started on cefepime at dialysis to replace the vancomycin We sent a copy of her MRI report to Dr. Geroge Baseman in [place] who is an orthopedic surgeon. The patient takes great stock in his opinion on this. She says she will go to [place] to have her leg amputated if Dr. Geroge Baseman does  not feel there is any salvage options. 11/2; she still is not talk to her orthopedic surgeon in [place]. Apparently he will call her at  [phone] this afternoon. The quality of this is she has not allowed me to refer her anywhere. She has been told over and over that she needs this amputated but has not agreed to be referred. She tells me her blood sugar was 600 last night but she has not been febrile. 11/9; she never did got a call from the orthopedic surgeon in [place] therefore that is off the radar. We have arranged to get her see orthopedic surgery at [facility]. She still has a lot of draining purulence coming out of the new abscess in her right leg although that probably came from the osteomyelitis in her right foot and heel. Meanwhile the original wound on the right foot looks very healthy. Continued improvement. The issue is that the last MRI showed osteomyelitis in her right foot extensively she now has an abscess in the right anterior lower leg. There is nobody in [place] who will offer this woman anything but an amputation and to be honest that is probably what she needs. I think she still wants to talk about limb salvage although at this point I just do not see that. She has completed her vancomycin at dialysis which was for the original staph aureus she is still on cefepime for the more recent Klebsiella. She has had a long course of both of these antibiotics which should have benefited the osteomyelitis on the right foot as well as the abscess. 11/16; apparently [place] elective surgery is shut down because of COVID-19 pandemic. I have reached out to some contacts at [facility] to see if we can get her an orthopedic appointment there. I am concerned about continually leaving this but for the moment everything is static. In fact her original large wound on this foot is closing down. It is the abscess on the right anterior leg that continues to drain purulent serosanguineous  material. She is not currently on any antibiotics however she had a prolonged course of vancomycin [1 month] as well as cefepime for a month 02/24/2019 on evaluation today patient appears to be doing better than the last time I saw her. This is not a patient that I typically see. With that being said I am covering for Dr. Dellia Nims this week and again compared to when I last saw her overall the wounds in particular seem to be doing significantly better which is good news. With that being said the patient tells me several disconcerting things. She has not been able to get in to see anyone for potential debridement in regard to her leg wounds although she tells me that she does not think it is necessary any longer because she is taking care of that herself. She noticed a string coming out of the lower wound on her leg over the last week. The patient states that she subsequently decided that we must of pack something in there and started pulling the string out and as it kept coming and coming she realized this was likely her tendon. With that being said she continued to remove as much of this as she could. She then I subsequently proceeded to using tubes of antibiotic ointment which she will stick down into the wound and then scored as much as she can until she sees it coming out of the other wound opening. She states that in doing this she is actually made things better and there is less redness and irritation. With regard to her foot wound she does have some necrotic tendon and tissue noted in one  small corner but again the actual wound itself seems to be doing better with good granulation in general compared to my last evaluation. 12/7; continued improvement in the wound on the substantial part of the right medial foot. Still a necrotic area inferiorly that required debridement but the rest of this looks very healthy and is contracting. She has 2 wounds on the right lateral leg which were her  original drainage sites from her abscess but all of this looks a lot better as well. She has been using silver alginate after putting antibiotic biotic ointment in one wound and watching it come out the other. I have talked to her in some detail today. I had given her names of orthopedic surgeons at [facility] for second opinion on what to do about the right leg. I do not think the patient never called them. She has not been able to get a hold of the orthopedic surgeon in [place] that she had put a lot of faith in as being somebody would give her an opinion that she would trust. I talked to her today and said even if I could get her in to another orthopedic surgeon about the leg which she accept an amputation and she said she would not therefore I am not going to press this issue for the moment 12/14; continued improvement in his substantial wound on the right medial foot. There is still a necrotic area inferiorly with tightly adherent necrotic debris which I have been working on debriding each time she is here. She does not have an orthopedic appointment. Since last time she was here I looked over her cultures which were essentially MRSA on the foot wound and gram- negative rods in the abscess on the anterior leg. Objective Constitutional Vitals Time Taken: 10:17 AM, Height: 67 in, Weight: 125 lbs, BMI: 19.6, Temperature: 98.2 F, Pulse: 89 bpm, Respiratory Rate: 18 breaths/min, Blood Pressure: 163/61 mmHg. Integumentary (Hair, Skin) Wound #43 status is Open. Original cause of wound was Gradually Appeared. The wound is located on the Right,Medial Foot. The wound measures 5cm length x 2cm width x 0.9cm depth; 7.854cm^2 area and 7.069cm^3 volume. There is muscle, tendon, and Fat Layer (Subcutaneous Tissue) Exposed exposed. There is no tunneling or undermining noted. There is a medium amount of serosanguineous drainage noted. The wound margin is well defined and not attached to the wound  base. There is large (67-100%) red, pink granulation within the wound bed. There is a small (1-33%) amount of necrotic tissue within the wound bed including Adherent Slough and Necrosis of Muscle. Wound #44 status is Healed - Epithelialized. Original cause of wound was Gradually Appeared. The wound is located on the Right Calcaneus. The wound measures 0cm length x 0cm width x 0cm depth; 0cm^2 area and 0cm^3 volume. There is no tunneling or undermining noted. There is a none present amount of drainage noted. The wound margin is flat and intact. There is no granulation within the wound bed. There is no necrotic tissue within the wound bed. Wound #46 status is Healed - Epithelialized. Original cause of wound was Other Lesion. The wound is located on the Right,Lateral Lower Leg. The wound measures 0cm length x 0cm width x 0cm depth; 0cm^2 area and 0cm^3 volume. There is no tunneling or undermining noted. There is a none present amount of drainage noted. The wound margin is well defined and not attached to the wound base. There is no granulation within the wound bed. There is no necrotic tissue within the  wound bed. Wound #47 status is Open. Original cause of wound was Gradually Appeared. The wound is located on the Right,Anterior Lower Leg. The wound measures 0.8cm length x 0.5cm width x 0.1cm depth; 0.314cm^2 area and 0.031cm^3 volume. There is Fat Layer (Subcutaneous Tissue) Exposed exposed. There is no tunneling or undermining noted. There is a medium amount of serosanguineous drainage noted. The wound margin is distinct with the outline attached to the wound base. There is large (67-100%) pink, hyper - granulation within the wound bed. There is a small (1-33%) amount of necrotic tissue within the wound bed including Adherent Slough. 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 There was a Excisional Skin/Subcutaneous Tissue Debridement with a total area of 4 sq cm performed by Ricard Dillon., MD. With the following instrument(s): Curette to remove Viable and Non-Viable tissue/material. Material removed includes Subcutaneous Tissue and Slough and. No specimens were taken. A time out was conducted at 11:00, prior to the start of the procedure. A Minimum amount of bleeding was controlled with Pressure. The procedure was tolerated well with a pain level of 0 throughout and a pain level of 0 following the procedure. Post Debridement Measurements: 5cm length x 2cm width x 0.9cm depth; 7.069cm^3 volume. Character of Wound/Ulcer Post Debridement is improved. Severity of Tissue Post Debridement is: Fat layer exposed. Post procedure Diagnosis Wound #43: Same as Pre-Procedure Plan Follow-up Appointments: Return Appointment in 1 week. Dressing Change Frequency: Wound #43 Right,Medial Foot: Change dressing three times week. Wound #47 Right,Anterior Lower Leg: Change dressing three times week. Wound Cleansing: Clean wound with Wound Cleanser - Anasept to all wounds Primary Wound Dressing: Wound #43 Right,Medial Foot: Calcium Alginate with Silver Wound #47 Right,Anterior Lower Leg: Calcium Alginate with Silver Secondary Dressing: Wound #43 Right,Medial Foot: Kerlix/Rolled [name] ABD pad Wound #47 Right,Anterior Lower Leg: Kerlix/Rolled [name] ABD pad Edema Control: Elevate legs to the level of the heart or above for 30 minutes daily and/or when sitting, a frequency of: - throughout the day Home  Health: Force skilled nursing for wound care. - Interim 1. Silver alginate to continue to the wound area 2. I am going to see about prophylactic antibiotics 3. The patient does not have an orthopedic appointment and right now I do not think there is a reason to send her she certainly would not agree to an amputation at this point unless she becomes critically ill Electronic Signature(s) Signed: 03/16/2019 5:56:50 PM By: Linton Ham MD Entered By: Linton Ham on 03/16/2019 11:46:46 -------------------------------------------------------------------------------- SuperBill Details Patient Name: Date of Service: [name] 03/16/2019 Medical Record Number:[id number] Patient Account Number: [id number] Date of Birth/Sex: Treating RN: [id number] (48 y.o. Nancy Fetter Primary Care Provider: [name] Other Clinician: Referring Provider: Treating Provider/Extender:Ninnie Fein, [name], [name] in Treatment: 27 Diagnosis Coding ICD-10 Codes Code Description [phone] Other chronic osteomyelitis, right ankle and foot L97.514 Non-pressure chronic ulcer of other part of right foot with necrosis of bone E10.621 Type 1 diabetes mellitus with foot ulcer L02.415 Cutaneous abscess of right lower limb L97.812 Non-pressure chronic ulcer  of other part of right lower leg with fat layer exposed Facility Procedures CPT4 Code Description: [id number] - DEB SUBQ TISSUE 20 SQ CM/< ICD-10 Diagnosis Description M86.671 Other chronic osteomyelitis, right ankle and foot L97.514 Non-pressure chronic ulcer of other part of right foot wit Modifier: h necrosis of Quantity: 1 bone Physician Procedures CPT4 Code Description: [id number] - WC PHYS SUBQ TISS 20 SQ CM ICD-10 Diagnosis Description M86.671 Other chronic osteomyelitis, right ankle and foot L97.514 Non-pressure chronic ulcer of other part of right foot wit Modifier: h necrosis of Quantity: 1  bone Electronic Signature(s) Signed: 03/16/2019 5:56:50 PM By: Linton Ham MD Entered By: Linton Ham on 03/16/2019 11:47:07

## 2019-03-23 ENCOUNTER — Encounter (HOSPITAL_BASED_OUTPATIENT_CLINIC_OR_DEPARTMENT_OTHER): Payer: Medicare HMO | Admitting: Internal Medicine

## 2019-03-23 ENCOUNTER — Other Ambulatory Visit: Payer: Self-pay

## 2019-03-23 DIAGNOSIS — M86671 Other chronic osteomyelitis, right ankle and foot: Secondary | ICD-10-CM | POA: Diagnosis not present

## 2019-03-24 ENCOUNTER — Encounter: Payer: Medicare HMO | Admitting: Nutrition

## 2019-03-24 NOTE — Progress Notes (Signed)
Nancy, [name] ([id number]) Visit Report for 03/23/2019 HPI Details Patient Name: Date of Service: [name], [name] 03/23/2019 12:45 PM Medical Record Number:[id number] Patient Account Number: [id number] Date of Birth/Sex: Treating RN: [birth year] (48 y.o. F) Primary Care Provider: [name] Other Clinician: Referring Provider: Treating Provider/Extender:, [name], [name] in Treatment: 28 History of Present Illness HPI Description: 48 year old diabetic who is known to have type 1 diabetes which is poorly controlled last hemoglobin A1c was 11%. She comes in with a ulcerated area on the left lateral foot which has been there for over 6 months. Was recently she has been treated by [name] of podiatry who saw her last on 05/28/2016. Review of his notes revealed that the patient had incision and drainage with placement of antibiotic beads to the left foot on 04/11/2016 for possible osteomyelitis of the cuboid bone. Over the last year she's had a history of amputation of the left fifth toe and a femoropopliteal popliteal bypass graft somewhere in April 2017. 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 His note [name] mentions that the patient has been referred to me for further wound care and possibly great candidate for hyperbaric oxygen therapy due to recurrent osteomyelitis. However we do not have any x-rays of biopsy reports confirming this. He has been on several antibiotics including Bactrim and most recently is on doxycycline for an MRSA. I understand, the patient was not a candidate for IV antibiotics as she has had previous PICC lines which resulted in blood clots in both arms. There was a x-ray report dated 04/04/2016 on [name] notes which showed evidence of fifth ray resection left foot with osteolytic changes noted to the fourth metatarsal and cuboid bone on the left. 06/13/2016 -- had a left foot x-ray which showed no acute  fracture or dislocation and no definite radiographic evidence of osteomyelitis. Advanced osteopenia was seen. 06/20/2016 -- she has noticed a new wound on the right plantar foot in the region where she had a callus before. 06/27/16- the patient did have her x-ray of the right foot which showed no findings to suggest osteomyelitis. She saw her endocrinologist, [name], yesterday. Her A1c in January was 11. He also indicates mismanagement and noncompliance regarding her diabetes. She is currently on Bactrim for a lip infection. She is complaining of nausea, vomiting and diarrhea. She is unable to articulate the exact orders or dosing of the Bactrim; it is unclear when she will complete this. 07/04/2016 -- results from [facility] of ABIs with ankle waveforms were noted from 02/14/2016. The examination done on 06/27/2015 showed noncompressible ABIs with the right being 1.45 and the left being 1.33. The present examination showed a right ABI of 1.19 on the left of 1.33. The conclusion was that right normal ABI in the lower extremity at rest however compared to previous study which was noncompressible ABI may be falsely elevated side suggesting medial calcification. The left ABI suggested medial calcification. 08/01/2016 -- the patient had more redness and pain on her right foot and did not get to come to see as noted she see her PCP or go to the ER and decided to take some leftover metronidazole which she had at home. As usual, the patient does report she feels and is rather noncompliant. 08/08/2016 -- -- x-ray of the right foot -- FINDINGS:Transmetatarsal amputation is noted. No bony destruction is noted to suggest osteomyelitis. IMPRESSION: No evidence of osteomyelitis. Postsurgical changes are seen. MRI would be more sensitive for possible  bony changes. Culture has grown Serratia Marcescens -- sensitive to Bactrim, ciprofloxacin, ceftazidime she was seen by [name] on 08/06/2016. He  did not find any exposed bone, muscle, tendon, ligament or joint. There was no malodor and he did a excisional debridement in the office. ============ Old notes: 48 year old patient who is known to the wound clinic for a while had been away from the wound clinic since 09/01/2014. Over the last several months she has been admitted to various hospitals including [place] at [facility] She was treated for a right metatarsal osteomyelitis with a transmetatarsal amputation and this was done about 2 months ago. He has a small ulcerated area on the right heel and she continues to have an ulcerated area on the left plantar aspect of the foot. The patient was recently admitted to the [facility] hospital group between 7/12 and 10/18/2014. she was given 3 weeks of IV vancomycin and was to follow-up with her surgeons at [facility] and also took oral vancomycin for C. difficile colitis. 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 MRI of the left foot done on 10/14/2014 showed no abscess or osteomyelitis. 04/27/15; this is a patient we know from previous stays in the wound care center. She is a type I diabetic I am not sure of her control currently. Since the last time I saw her she is had a right transmetatarsal amputation and has no wounds on her right foot and has no open wounds. She is been followed at the wound care center at [facility] in [place]. She comes today with the desire to undergo hyperbaric treatment locally. Apparently one of her wound care providers in [place] has suggested hyperbarics. This is in response to an MRI from 04/18/15 that showed increased marrow signal and loss of the proximal fifth  metatarsal cortex evidence of osteomyelitis with likely early osteomyelitis in the cuboid bone as well. She has a large wound over the base of the fifth metatarsal. She also has a eschar over her the tips of her toes on 1,3 and 5. She does not have peripheral pulses and apparently is going for an angiogram tomorrow which seems reasonable. After this she is going to infectious disease at [facility]. They have been using Medihoney to the large wound on the lateral aspect of the left foot to. The patient has known Charcot deformity from diabetic neuropathy. She also has known diabetic PAD. Surprisingly I can't see that she has had any recent antibiotics, the patient states the last antibiotic she had was at the end of November for 10 days. I think this was in response to culture that showed group G strep although I'm not exactly sure where the culture was from. She is also had arterial studies on 03/29/15. This showed a right ABI of 1.4 that was noncompressible. Her left ABI was 0.73. 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 It was not felt that arterial inflow was adequate for healing of a foot ulcer. Her Doppler waveforms looked monophasic ===== READMISSION 02/28/17; this is in an now 48 year old woman we've had at several different occasions in this clinic. 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 She has a remote ex-smoker. She was last seen in this clinic by  Dr. Con Memos I think in May. More recently she is been followed by her podiatrist [name] an infectious disease Dr. Megan Salon. She has 2 open wounds the major one is over the right first metatarsal head she also has a wound on the left plantar foot. an MRI of the right foot on 01/01/17 showed a soft tissue ulcer along the plantar aspect of the first metatarsal base consistent with osteomyelitis of the first metatarsal stump. Dr. 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 According to the patient this is been open for slightly over a month. She has been on a combination of Cipro 500 twice a day, Zyvox 600 twice a day and Flagyl 500 3 times a day for over a month now as directed by Dr. Megan Salon. cultures of the right foot earlier this year showed MRSA in January and Serratia in May. January also had a few viridans strep. Recent x-rays of both feet were done and [name] office and I don't have these reports. The patient has known PAD and has a history of aleft femoropopliteal bypass in April 2017. 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 Dwyane Dee. In spite of this the last hemoglobin A1c I can see is 10.1 on 01/01/2017. She is being referred by 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 She is been using Medihoney to this area and also an area on the left midfoot. She is using healing sandals bilaterally. ABIs in this clinic at the left posterior tibial was 1.1 noncompressible on the right READMISSION Non invasive vascular NOVANT 5/18 Aftercare following surgery of the circulatory system Procedure Note - Interface, External Ris In - 08/13/2016 11:05 AM EDT Procedure: Examination consists of physiologic resting arterial pressures of the brachial and ankle arteries bilaterally with continuous wave Doppler waveform analysis. Previous: Previous exam performed on 02/14/16 demonstrated ABIs of Rt = 1.19 and Lt = 1.33. Right: ABI = non-compressible PT, 1.47 DP. S/P transmet amputation. Left: ABI = 1.52, 2nd digit pressure = 87 mmHg Conclusions: Right: ABI (>1.3) may be falsely elevated, suggesting medial calcification. Left: ABI (>1.3) may be falsely  elevated, suggesting medial calcification The patient is a now 48 year old type I diabetic is had multiple issues her graded to chronic diabetic foot ulcers. 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 We had her in the clinic lastin November. At that point she had wounds on her bilateral feet.she had wanted to try hyperbarics however the healogics review process denied her because she hadn't followed up with her vascular surgeon for her left femoropopliteal bypass. The bypass was done by Dr. Raul Del at [facility]. We made her a follow-up with Dr. Raul Del however she did not keep the appointment and therefore she was not approved The patient shows me a small wound on her left fourth metatarsal head on her phone. She developed rapid discoloration in the plantar aspect of the left foot and she was admitted to hospital from 2/2 through 05/10/17 with wet gangrene of the left foot osteomyelitis of the fourth metatarsal heads. She was admitted acutely ill with a temperature of 103. She was started on broad-spectrum vancomycin and cefepime. On 05/06/17 she was  taken to the OR by [name] her podiatric surgeon for an incision and drainage irrigation of the left foot wound. Cultures from this surgery revealed group be strep and anaerobes. she was seen by Dr.Xu of orthopedic surgery and scheduled for a below-knee amputation which she u refused. Ultimately she was discharged on Levaquin and Flagyl for one month. MRI 05/05/17 done while she was in the hospital showed abscess adjacent to the fourth metatarsal head and neck small abscess around the fourth flexor tendon. Inflammatory phlegmon and gas in the soft tissues along the lateral aspect of the fourth phalanx. Findings worrisome for osteomyelitis involving the fourth proximal and middle phalanx and also the third and fourth metatarsals. Finally the patient had actually shortly  before this followed up with Dr. Raul Del at no time on 04/29/17. He felt that her left femoropopliteal bypass was patent he felt that her left-sided toe pressures more than adequate for healing a wound on the left foot. This was before her acute presentation. 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 05/28/17; she is started hyperbarics. The patient tells me that for some reason she was not actually on Levaquin but I think on ciprofloxacin. She was on Flagyl. She only started her Levaquin yesterday due to some difficulty with the pharmacy and perhaps her sister picking it up. She has an appointment with [name] tomorrow and with infectious disease early next week. She has no new complaints 06/06/17; the patient continues in hyperbarics. She saw [name] on 05/29/17 who is her podiatric surgeon. He is elected for a transmetatarsal amputation on 06/27/17. I'm not sure at what level he plans to do this amputation. The patient is unaware She also saw Dr. Megan Salon of infectious disease who elected to continue her on current antibiotics I think this is ciprofloxacin and Flagyl. I'll need to clarify with her tomorrow if she actually has this. We're using silver alginate to the actual wound. Necrotic surface today with material under the flap of her foot. Original MRI showed abscesses as well as osteomyelitis of the proximal and middle fourth phalanx and the third and fourth metatarsal heads 06/11/17; patient continues in hyperbarics and continues on oral antibiotics. She is doing well. The wound looks better. The necrotic part of this under the flap in her superior foot also looks better. she is been to see [name]. I haven't had a chance to look at his note. Apparently he has put the transmetatarsal amputation on hold her request it is still planning to take her to the OR for debridement and product application ACEL. I'll see if I can find his note. I'll therefore leave product ordering/requests to Dr.  [name] for now. I was going to look at Dermagraft 06/18/17-she is here in follow-up evaluation for bilateral foot wounds. She continues with hyperbaric therapy. She states she has been applying manuka honey to the right plantar foot and alternate manuka honey and silver alginate to the left foot, despite our orders. We will continue with same treatment plan and she will follo up next week. 06/25/17; I have reviewed [name] last note from 3/11. She has operative debridement in 2 days' time. By review his note apparently they're going to place there is skin over the majority of this wound which is a good choice. She has a small satellite area at the most proximal part of this wound on the left plantar foot. The area on the right plantar foot we've been using silver alginate and it is close to  healing. 07/02/17; unfortunately the patient was not easily approved for [name] proposed surgery. I'm not completely certain what the issue is. She has been using silver alginate to the wound she has completed a first course of hyperbarics. She is still on Levaquin and Flagyl. I have really lost track of the time course here.I suspect she should have another week to 2 of antibiotics. I'll need to see if she is followed up with infectious disease Dr. Megan Salon 07/09/17; the patient is followed up with Dr. Megan Salon. 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 She continues on Levaquin and metronidazole continuing both of these for now I think she is been on fr about 6 weeks. She still has some drainage but no pain. No fever. Her had been plans for her to go to the OR for operative debridement with her podiatrist [name], I am not exactly sure where that is. I'll probably slip a note to [name] today. I note that she follows with Dr. Dwyane Dee of endocrinology. We have her recertified for hyperbaric oxygen. I have not heard about Dermagraft however I'll see if [name] is  planning a skin substitute as well 07/16/17; the patient tells me she is just about out of [place]. I'll need to check Dr. Hale Bogus last notes on this. She states she has plenty of Flagyl however. She comes in today complaining of pain in the right lateral foot which she said lasted for about a day. The wound on the right foot is actually much more medially. She also tells me that the [facility] cost a lot of pain in the left foot wound and she turned back to silver alginate. Finally Dermagraft has a $188 per application co-pay. She cannot afford this 07/23/17; patient arrives today with the wound not much smaller. There is not much new to add. She has not heard from [name] all try to put in a call to them today. She was asking about Dermagraft again and she has an over $416 per application co-pay she states that she would be willing to try to do a payment plan. I been tried to avoid this. We've been using silver alginate, I'll change to [facility] 07/30/17-She is here in follow-up evaluation for left foot ulcer. She continues hyperbaric medicine. The left foot ulcer is stable we will continue with same treatment plan 08/06/17; she is here for evaluation of her left foot ulcer. Currently being treated for hyperbarics or underlying osteomyelitis. She is completed antibiotics. The left foot ulcer is better smaller with healthier looking granulation. For various reasons I am not really clear on we never got her back to the OR with [name]. He did not respond to my secure text message. Nevertheless I think that surgery on this point is not necessary nor am I completely clear that a skin substitute is necessary The patient is complaining about pain on the outside of her right foot. 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 There is no erythema. She also states the foot is warm versus her other part of her upper leg and this is largely true. It is not totally clear to me what's  causing this. She thinks it's different from her usual neuropathy pain 08/13/17; she arrives in clinic today with a small wound which is superficial on her right first metatarsal head. 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 She tells [place] she was up on her feet  over the Mother's Day celebration. The large wound is on the left foot. Continues with hyperbarics for underlying osteomyelitis. We're using Hydrofera Blue. She asked me today about where we were with Dermagraft. I had actually excluded this because of the co- pay however she wants to assume this therefore I'll recheck the co-pay an order for next week. 08/20/17; the patient agreed to accept the co-pay of the first Apligraf which we applied today. She is disappointed she is finishing hyperbarics will run this through the insurance on the extent of the foot infection and the extent of the wound that she had however she is already had 60 dive's. Dermagraft No. 1 08/27/17; Dermagraft No. 2. She is not eligible for any more hyperbaric treatments this month. She reports a fair amount of drainage and she actually changed to the external dressings without disturbing the direct contact layer 09/03/17; the patient arrived in clinic today with the wound superficially looking quite healthy. Nice vibrant red tissue with some advancing epithelialization although not as much adherence of the flap as I might like. However she noted on her own fourth toe some bogginess and she brought that to our attention. Indeed this was boggy feeling like a possibility of subcutaneous fluid. 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 She is not been unwell. We've been using Dermagraft 09/10/17; the culture that I did not last week was MRSA. She saw Dr. Megan Salon this morning who is going to start her on vancomycin. I had sent him a secure a text message yesterday. I also spoke with  her podiatric surgeon [name] about surgery on this foot the options for conserving a functional foot etc. Promised me he would see her and will make back consultation today. Paradoxically her actual wound on the plantar aspect of her left foot looks really quite good. I had given her 5 days worth of Baxdella to cover her for MRSA. Her MRI came back showing osteomyelitis within the third metatarsal shaft and head and base of the third and fourth proximal phalanx. She had extensive inflammatory changes throughout the soft tissue of the lateral forefoot. With an ill-defined fluid around the fourth metatarsal extending into the plantar and dorsal soft tissues 09/19/17; the patient is actually on oral Septra and Flagyl. She apparently refused IV vancomycin. She also saw [name] at my request who is planning her for a left BKA sometime in mid July. MRI showed osteomyelitis within the third metatarsal shaft and head and the basis of the third and fourth proximal phalanx. I believe there was felt to be possible septic arthritis involving the third MTP. 09/26/17; the patient went back to Dr. Megan Salon at my suggestion and is now receiving IV daptomycin. Her wound continues to look quite good making the decision to proceed with a transmetatarsal amputation although more difficult for the patient. I believe in my extensive discussions with her she has a good sense of the pros and cons of this. I don't NV the tuft decision she has to make. She has an appointment with [name] I believe in mid July and I previously spoken to him about this issue Has we had used 3 previous Dermagraft. Given the condition of the wound surface I went ahead and added the fourth one today area and I did this not fully realizing that she'll be traveling to [place] next week. I'm hopeful she can come back in  2 weeks 10/21/17; Her same Dermagraft on for about 3-1/2 weeks. In spite of this the wound arrives looking quite  healthy. There is been a lot of healing dimensions are smaller. Looking at the square shaped wound she has now there is some undermining and some depth medially under the undermining although I cannot palpate any bone. No surrounding infection is obvious. She has difficult questions about how to look at this going forward vis--vis amputations versus continued medical therapy. To be truthful the wound is looks so healthy and it is continued to contract. Hard to justify foot surgery at this point although I still told her that I think it might come to that if we are not able to eradicate the underlying MRSA. She is still highly at risk and she understands this 11/06/17 on evaluation today patient appears to be doing better in regard to her foot ulcer. She's been tolerating the dressing changes without complication. Currently she is here for her Dermagraft #6. Her wound continues to make excellent progress at this point. She does not appear to have any evidence of infection which is good news. 11/13/17 on evaluation today patient appears to be doing excellent at this time. She is here for repeat Dermagraft application. This is #7. Overall her wound seems to be making great progress. 12/05/17; the patient arrives with the wound in much better condition than when I last saw this almost 6 weeks ago. She still has a small probing area in the left metatarsal head region on the lateral aspect of her foot. We applied her last Dermagraft today. Since the last time she is here she has what appears to a been a blood blister on the plantar aspect of left foot although I don't see this is threatening. There is also a thick raised tissue on the right mid metatarsal head region. This was not there I don't think the last time she was here 3 weeks ago. 12/12/17; the patient continues to have a small programming area in the left metatarsal head region on the lateral aspect of her foot which was the initial large surgical  wound. I applied her last Apligraf last week. I'm going to use Endoform starting today Unfortunately she has an excoriated area in the left mid foot and the right mid foot. The left midfoot looks like a blistered area this was not opened last week it certainly is open today. Using silver alginate on these areas. She promises me she is offloading this. 12/19/17; the small probing area in the left metatarsal head eyes think is shallower. In general her original wound looks better. We've been using Endoform. The area inferiorly that I think was trauma last week still requires debridement a lot of nonviable surface which I removed. She still has an open open area distally in her foot Similarly on the right foot there is tightly adherent surface debris which I removed. Still areas that don't look completely epithelialized. This is a small open area. We used silver alginate on these areas 12/26/2017; the patient did not have the supplies we ordered from last week including the Endoform. The original large wound on the left lateral foot looks healthy. She still has the undermining area that is largely unchanged from last week. She has the same heavily callused raised edged wounds on the right mid and left midfoot. Both of these requiring debridement. We have been using silver alginate on these areas 01/02/2018; there is still supply issues. We are going to try to use  Prisma but I am not sure she actually got it from what she is saying. She has a new open area on the lateral aspect of the left fourth toe [previous fifth ray amputation]. Still the one tunneling area over the fourth metatarsal head. The area is in the midfoot bilaterally still have thick callus around them. She is concerned about a raised swelling on the lateral aspect of the foot. However she is completely insensate 01/10/2018; we are using Prisma to the wounds on her bilateral feet. Surprisingly the tunneling area over the left fourth  metatarsal head that was part of her original surgery has closed down. She has a small open area remaining on the incision line. 2 open areas in the midfoot. 02/10/2018; the patient arrives back in clinic after a month hiatus. She was traveling to visit family in [place]. Is fairly clear she was not offloading the areas on her feet. The original wound over the left lateral foot at the level of metatarsal heads is reopened and probes medially by about a centimeter or 2. She notes that a week ago she had purulent drainage come out of an area on the left midfoot. Paradoxically the worst area is actually on the right foot is extensive with purulent drainage. We will use silver alginate today 02/17/2018; the patient has 3 wounds one over the left lateral foot. She still has a small area over the metatarsal heads which is the remnant of her original surgical wound. This has medial probing depth of roughly 1.4 cm somewhat better than last week. The area on the right foot is larger. We have been using silver alginate to all areas. The area on the right foot and left foot that we cultured last week showed both Klebsiella and Proteus. Both of these are quinolone sensitive. The patient put her's self on Bactrim and Flagyl that she had left hanging around from prior antibiotic usages. She was apparently on this last week when she arrived. I did not realize this. Unfortunately the Bactrim will not cover either 1 of these organisms. We will send in Cipro 500 twice daily for a week 03/04/2018; the patient has 2 wounds on the left foot one is the original wound which was a surgical wound for a deep DFU. At one point this had exposed bone. She still has an area over the fourth metatarsal head that probes about 1.4 cm although I think this is better than last week. I been using silver nitrate to try and promote tissue adherence and been using silver alginate here. She also has an area in the left midfoot. This has  some depth but a small linear wound. Still requiring debridement. On the right midfoot is a circular wound. A lot of thick callus around this area. We have been using silver alginate to all wound areas She is completed the ciprofloxacin I gave her 2 weeks ago. 03/11/2018; the patient continues to have 2 open areas on the left foot 1 of which was the original surgical wound for a deep DFU. Only a small probing area remains although this is not much different from last week we have been using silver alginate. The other area is on the midfoot this is smaller linear but still with some depth. We have been using silver alginate here as well On the right foot she has a small circular wound in the mid aspect. This is not much smaller than last time. We have been using silver alginate here as well 03/18/2018; she  has 3 wounds on the left foot the original surgical wound, a very superficial wound in the mid aspect and then finally the area in the mid plantar foot. She arrives in today with a very concerning area in the wound in the mid plantar foot which is her most proximal wound. There is undermining here of roughly 1-1/2 cm superiorly. Serosanguineous drainage. She tells me she had some pain on for over the weekend that shot up her foot into her thigh and she tells me that she had a nodule in the groin area. She has the single wound in the right foot. We are using endoform to both wound areas 03/24/2018; the patient arrives with the original surgical wound in the area on the left midfoot about the same as last week. There is a collection of fluid under the surface of the skin extending from the surgical wound towards the midfoot although it does not reach the midfoot wound. The area on the right foot is about the same. Cultures from last week of the left midfoot wound showed abundant Klebsiella abundant Enterococcus faecalis and moderate methicillin resistant staph I gave her Levaquin but this would  have only covered the Klebsiella. She will need linezolid 04/01/2018; she is taking linezolid but for the first few days only took 1 a day. I have advised her to finish this at twice daily dosing. In any case all of her wounds are a lot better especially on the left foot. The original surgical wound is closed. The area on the left midfoot considerably smaller. The area on the right foot also smaller. 04/08/2018; her original surgical wound/osteomyelitis on the left foot remains closed. She has area on the left foot that is in the midfoot area but she had some streaking towards this. This is not connected with her original wound at least not visually. Small wound on the right midfoot appears somewhat smaller. 04/15/18; both wounds looks better. Original wound is better left midfoot. Using silver alginate 1/21; patient states she uses saltwater soak in, stones or remove callus from around her wounds. She is also concerned about a blood blister she had on the left foot but it simply resolved on its own. We've been using silver alginate 1/28; the patient arrives today with the same streaking area from her metatarsals laterally [the site of her original surgical wound] down to the middle of her foot. There is some drainage in the subcutaneous area here. This concerns me that there is actually continued ongoing infection in the metatarsals probably the fourth and third. This fixates an MRI of the foot without contrast [chronic renal failure] The wound in the mid part of the foot is small but I wonder whether this area actually connects with the more distal foot. The area on the right midfoot is probably about the same. Callus thick skin around the small wound which I removed with a curette we have been using silver alginate on both wound areas 2/4; culture I did of the draining site on the left foot last time grew methicillin sensitive staph aureus. MRI of the left foot showed interval resolution of the  findings surrounding the third metatarsal joint on the prior study consistent with treated osteomyelitis. Chronic soft tissue ulceration in the plantar and lateral aspect of the forefoot without residual focal fluid collection. No evidence of recurrent osteomyelitis. Noted to have the previous amputation of the distal first phalanx and fifth ray MRI of the right foot showed no evidence of osteomyelitis I  am going to treat the patient with a prolonged course of antibiotics directed against MSSA in the left foot 2/11; patient continues on cephalexin. She tells me she had nausea and vomiting over the weekend and missed 2 days. In general her foot looks much the same. She has a small open area just below the left fourth metatarsal head. A linear area in the left midfoot. Some discoloration extending from the inferior part of this into the left lateral foot although this appears to be superficial. She has a small area on the right midfoot which generally looks smaller after debridement 2/18; the patient is completing his cephalexin and has another 2 days. She continues to have open areas on the left and right foot. 2/25; she is now off antibiotics. The area on the left foot at the site of her original surgical wound has closed yet again. She still has open areas in the mid part of her foot however these appear smaller. The area on the right mid foot looks about the same. We have been using silver alginate She tells me she had a serious hypoglycemic spell at home. She had to have EMS called and get IV dextrose 3/3; disappointing on the left lateral foot large area of necrotic tissue surrounding the linear area. This appears to track up towards the same original surgical wound. Required extensive debridement. The area on the right plantar foot is not a lot better also using silver 3/12; the culture I did last time showed abundant enterococcus. I have prescribed Augmentin, should cover any unrecognized  anaerobes as well. In addition there were a few MRSA and Serratia that would not be well covered although I did not want to give her multiple antibiotics. She comes in today with a new wound in the right midfoot this is not connected with the original wound over her MTP a lot of thick callus tissue around both wounds but once again she said she is not walking on these areas 3/17-Patient comes in for follow-up on the bilateral plantar wounds, the right midfoot and the left plantar wound. Both these are heavily callused surrounding the wounds. We are continuing to use silver alginate, she is compliant with offloading and states she uses a wheelchair fairly often at home 3/24; both wound areas have thick callus. However things actually look quite a bit better here for the majority of her left foot and the right foot. 3/31; patient continues to have thick callused somewhat irritated looking tissue around the wounds which individually are fairly superficial. There is no evidence of surrounding infection. We have been using silver alginate however I change that to [facility] today 4/17; patient returns to clinic after having a scare with Covid she tested negative in her primary doctor's office. She has been using Hydrofera Blue. She does not have an open area on the right foot. On the left foot she has a small open area with the mid area not completely viable. She showed me pictures of what looks like a hemorrhagic blister from several days ago but that seems to have healed over this was on the lateral left foot 4/21; patient comes in to clinic with both her wounds on her feet closed. However over the weekend she started having pain in her right foot and leg up into the thigh. She felt as though she was running a low-grade fever but did not take her temperature. She took a doxycycline that she had leftover and yesterday a single Septra and metronidazole.  She thinks things feel somewhat  better. 4/28; duplex ultrasound I ordered last week was negative for DVT or superficial thrombophlebitis. She is completed the doxycycline I gave her. States she is still having a lot of pain in the right calf and right ankle which is no better than last week. She cannot sleep. She also states she has a temperature of up to 101, coughing and complaining of visual loss in her bilateral eyes. Apparently she was tested for Covid 2 weeks ago at [facility] and that was negative. Readmission: 09/03/18 patient presents back for reevaluation after having been evaluated at the end of April regarding erythema and swelling of her right lower extremity. Subsequently she ended up going to the hospital on 07/29/18 and was admitted not to be discharged until 08/08/18. Unfortunately it was noted during the time that she was in the hospital that she did have methicillin-resistant Staphylococcus aureus as the infection noted at the site. It was also determined that she did have osteomyelitis which appears to be fairly significant. She was treated with vancomycin and in fact is still on IV vancomycin at dialysis currently. This is actually slated to continue until 09/12/18 at least which will be the completion of the six weeks of therapy. Nonetheless based on what I'm seeing at this point I'm not sure she will be anywhere near ready to discontinue antibiotics at that time. Since she was released from the hospital she was seen by [name] who is her podiatrist on 08/27/18. His note specifically states that he is recommended that the patient needs of one knee amputation on the right as she has a life-threatening situation that can lead quickly to sepsis. The patient advised she would like to try to save her leg to which [name] apparently told her that this was against all medical advice. She also want to discontinue the Wound VAC which had been initiated due to the fact that she wasn't pleased with how the wound was looking  and subsequently she wanted to pursue applying Medihoney at that time. He stated that he did not believe that the right lower extremity was salvageable and that the patient understood but would still like to attempt hyperbaric option therapy if it could be of any benefit. She was therefore referred back to [place] for further evaluation. He plans to see her back next week. Upon inspection today patient has a significant amount purulent drainage noted from the wound at this point. The bone in the distal portion of her foot also appears to be extremely necrotic and spongy. When I push down on the bone it bubbles and seeps purulent drainage from deeper in the end of the foot. I do not think that this is likely going to heal very well at all and less aggressive surgical debridement were undertaken more than what I believe we can likely do here in our office. 09/12/2018; I have not seen this patient since the most recent hospitalization although she was in our clinic last week. I have reviewed some of her records from a complex hospitalization. 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 There is situation was complicated by MRSA bacteremia and acute on chronic renal failure now on dialysis. She is receiving vancomycin at dialysis. We started her on Dakin's wet-to- dry last week she is changing this daily. There is still purulent drainage coming out of her foot. Although she is apparently "agreeable" to a below-knee amputation which is been  suggested by multiple clinicians she wants this to be done in [place]. She apparently has a telehealth visit with that provider sometime in late [place] 6/24. I have told her I think this is probably too long. Nevertheless I could not convince her to allow a local doctor to perform BKA. 09/19/2018; the patient has a large necrotic area on the right anterior foot. She has had previous transmetatarsal amputations. Culture I did  last week showed MRSA nothing else she is on vancomycin at dialysis. She has continued leaking purulent drainage out of the distal part of the large circular wound on the right anterior foot. She apparently went to see Dr. 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 Somehow that translated into her being referred to plastic surgery for debridement of the area. I gather she basically refused amputation although I do not have a copy of Dr. Berenice Primas notes. The patient really wants to have a trial of hyperbaric oxygen. I agreed with initial assessment in this clinic that this was probably too far along to benefit however if she is going to have plastic surgery I think she would benefit from ancillary hyperbaric oxygen. The issue here is that the patient has benefited as maximally as any patient I have ever seen from hyperbaric oxygen therapy. Most recently she had exposed bone on the lateral part of her left foot after a surgical procedure and that actually has closed. She has eschared areas in both heels but no open area. She is remained systemically well. I am not optimistic that anything can be done about this but the patient is very clear that she wants an attempt. The attempt would include a wound VAC further debridements and hyperbaric oxygen along with IV antibiotics. 6/26; I put her in for a trial of hyperbaric oxygen only because of the dramatic response she has had with wounds on her left midfoot earlier this year which was a surgical wound that went straight to her bone over the metatarsal heads and also remotely the left third toe. We will see if we can get this through our review process and insurance. She arrives in clinic with again purulent material pouring out of necrotic bone on the top of the foot distally. There is also some concerning erythema on the front of the leg that we marked. It is bit difficult to tell how tender this is because of neuropathy. I  note from infectious disease that she had her vancomycin extended. 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 She had the wound VAC on for part of the week. The rest of the time she is putting various things on this including Medihoney, "ionized water" silver sorb gel etc. 7/7; follow-up along with HBO. She is still on vancomycin at dialysis. She has a large open area on the dorsal right foot and a small dark eschar area on her heel. There is a lot less erythema in the area and a lot less tenderness. From an infection point of view I think this is better. She still has a lot of necrosis in the remaining right forefoot [previous TMA] we are still using the wound VAC in this area 7/16; follow-up along with HBO. I put her on linezolid after she finished her vancomycin. We started this last Friday I gave her 2 weeks worth. I had the expectation that she would be operatively debrided by Dr. Marla Roe but that still has not happened yet. Patient phoned the office this  week. She arrives for review today after HBO. The distal part of this wound is completely necrotic. Nonviable pieces of tendon bone was still purulent drainage. Also concerning that she has black eschar over the heel that is expanding. I think this may be indicative of infection in this area as well. She has less erythema and warmth in the ankle and calf but still an abnormal exam 7/21 follow-up along with HBO. I will renew her linezolid after checking a CBC with differential monitoring her blood counts especially her platelets. She was supposed to have surgery yesterday but if I am reading things correctly this was canceled after her blood sugar was found to be over 500. I thought Dr. Marla Roe who called me said that they were sending her to the ER but the patient states that was not the case. 7/28. Follow-up along with HBO. She is on linezolid I still do not have any lab work from dialysis even though  I called last week. The patient is concerned about an area on her left lateral foot about the level of the base of her fifth metatarsal. I did not really see anything that ominous here however this patient is in [place] ability to point out problems that she is sensing and she has been accurate in the past Finally she received a call from Dr. Marla Roe who is referring her to another orthopedic surgeon stating that she is too booked up to take her to the operating room now. Was still using a wound VAC on the foot 8/3 -Follow-up after HBO, she is got another week of linezolid, she is to call ID for an appointment, x-rays of both feet were reviewed, the left foot x-ray with third MTP joint osteo- Right foot x-ray widespread osteo-in the right midfoot Right ankle x-ray does not show any active evidence of infection 8/11-Patient is seen after HBO, the wounds on the right foot appear to be about the same, the heel wound had some necrotic base over tendon that was debrided with a curette 8/21; patient is seen after HBO. The patient's wound on her dorsal foot actually looks reasonably good and there is substantial amount of epithelialization however the open area distally still has a lot of necrotic debris partially bone. I cannot really get a good sense of just how deep this probes under the foot. She has been pressuring me this week to order medical maggots through a company in [place] for her. The problem I have is there is not a defined wound area here. On the positive side there is no purulence. She has been to see infectious disease she is still on Septra DS although I have not had a chance to review their notes 8/28; patient is seen in conjunction with HBO. The wounds on her foot continued to improve including the right dorsal foot substantially the, the distal part of this wound and the area on the right heel. We have been using a wound VAC over this chronically. She is still on trimethoprim  as directed by infectious disease 9/4; patient is seen in conjunction with HBO. Right dorsal foot wound substantially anteriorly is better however she continues to have a deep wound in the distal part of this that is not responding. We have been using silver collagen under border foam Area on the right plantar medial heel seems better. We have been using Hydrofera Blue 12/12/18 on evaluation today patient appears to be doing about the same with regard to her wound based on prior  measurements. She does have some necrotic tissue noted on the lateral aspect of the wound that is going require a little bit of sharp debridement today. This includes what appears to be potentially either severely necrotic bone or tendon. Nonetheless other than that she does not appear to have any severe infection which is good news 9/18; it is been 2 weeks since I saw this wound. She is tolerating HBO well. Continued dramatic improvement in the area on the right dorsal foot. She still has a small wound on the heel that we have been using Hydrofera Blue. She continues with a wound VAC 9/24; patient has to be seen emergently today with a swelling on her right lateral lower leg. She says that she told Dr. Evette Doffing about this and also myself on a couple of occasions but I really have no recollection of this. She is not systemically unwell and her wound really looked good the last time I saw this. She showed this to providers at dialysis and she was able to verify that she was started on cephalexin today for 5 doses at dialysis. She dialyzes on Tuesday Thursday and Saturday. 10/2; patient is seen in conjunction with HBO. The area that is draining on the right anterior medial tibia is more extensive. Copious amounts of serosanguineous drainage with some purulence. We are still using the wound VAC on the original wound then it is stable. Culture I did of the original IandD showed MRSA I contacted dialysis she is now on vancomycin  with dialysis treatments. I asked them to run a month 10/9; patient seen in conjunction with HBO. She had a new spontaneous open area just above the wound on the right medial tibia ankle. More swelling on the right medial tibia. Her wound on the foot looks about the same perhaps slightly better. There is no warmth spreading up her leg but no obvious erythema. her MRI of the foot and ankle and distal tib-fib is not booked for next Friday I discussed this with her in great detail over multiple days. it is likely she has spreading infection upper leg at least involving the distal 25% above the ankle. She knows that if I refer her to orthopedics for infectious disease they are going to recommend amputation and indeed I am not against this myself. We had a good trial at trying to heal the foot which is what she wanted along with antibiotics debridement and HBO however she clearly has spreading infection [probably staph aureus/MRSA]. Nevertheless she once again tells me she wants to wait the left of the MRI. She still makes comments about having her amputation done in [place]. 10/19; arrives today with significant swelling on the lateral right leg. Last culture I did showed Klebsiella. Multidrug- resistant. Cipro was intermediate sensitivity and that is what I have her on pending her MRI which apparently is going to be done on Thursday this week although this seems to be moving back and forth. She is not systemically unwell. We are using silver alginate on her major wound area on the right medial foot and the draining areas on the right lateral lower leg 10/26; MRI showed extensive abscess in the anterior compartment of the right leg also widespread osteomyelitis involving osseous structures of the midfoot and portions of the hindfoot. Also suspicion for osteomyelitis anterior aspect of the distal medial malleolus. Culture I did of the purulence once again showed a multidrug-resistant Klebsiella. I  have been in contact with nephrology late last week and she has  been started on cefepime at dialysis to replace the vancomycin We sent a copy of her MRI report to Dr. Geroge Baseman in [place] who is an orthopedic surgeon. The patient takes great stock in his opinion on this. She says she will go to [place] to have her leg amputated if Dr. Geroge Baseman does not feel there is any salvage options. 11/2; she still is not talk to her orthopedic surgeon in [place]. Apparently he will call her at [phone] this afternoon. The quality of this is she has not allowed me to refer her anywhere. She has been told over and over that she needs this amputated but has not agreed to be referred. She tells me her blood sugar was 600 last night but she has not been febrile. 11/9; she never did got a call from the orthopedic surgeon in [place] therefore that is off the radar. We have arranged to get her see orthopedic surgery at [facility]. She still has a lot of draining purulence coming out of the new abscess in her right leg although that probably came from the osteomyelitis in her right foot and heel. Meanwhile the original wound on the right foot looks very healthy. Continued improvement. The issue is that the last MRI showed osteomyelitis in her right foot extensively she now has an abscess in the right anterior lower leg. There is nobody in [place] who will offer this woman anything but an amputation and to be honest that is probably what she needs. I think she still wants to talk about limb salvage although at this point I just do not see that. She has completed her vancomycin at dialysis which was for the original staph aureus she is still on cefepime for the more recent Klebsiella. She has had a long course of both of these antibiotics which should have benefited the osteomyelitis on the right foot as well as the abscess. 11/16; apparently [place] elective surgery is shut down because of COVID-19 pandemic.  I have reached out to some contacts at [facility] to see if we can get her an orthopedic appointment there. I am concerned about continually leaving this but for the moment everything is static. In fact her original large wound on this foot is closing down. It is the abscess on the right anterior leg that continues to drain purulent serosanguineous material. She is not currently on any antibiotics however she had a prolonged course of vancomycin [1 month] as well as cefepime for a month 02/24/2019 on evaluation today patient appears to be doing better than the last time I saw her. This is not a patient that I typically see. With that being said I am covering for Dr. Dellia Nims this week and again compared to when I last saw her overall the wounds in particular seem to be doing significantly better which is good news. With that being said the patient tells me several disconcerting things. She has not been able to get in to see anyone for potential debridement in regard to her leg wounds although she tells me that she does not think it is necessary any longer because she is taking care of that herself. She noticed a string coming out of the lower wound on her leg over the last week. The patient states that she subsequently decided that we must of pack something in there and started pulling the string out and as it kept coming and coming she realized this was likely her tendon. With that being said she continued to remove as much  of this as she could. She then I subsequently proceeded to using tubes of antibiotic ointment which she will stick down into the wound and then scored as much as she can until she sees it coming out of the other wound opening. She states that in doing this she is actually made things better and there is less redness and irritation. With regard to her foot wound she does have some necrotic tendon and tissue noted in one small corner but again the actual wound itself seems to be doing  better with good granulation in general compared to my last evaluation. 12/7; continued improvement in the wound on the substantial part of the right medial foot. Still a necrotic area inferiorly that required debridement but the rest of this looks very healthy and is contracting. She has 2 wounds on the right lateral leg which were her original drainage sites from her abscess but all of this looks a lot better as well. She has been using silver alginate after putting antibiotic biotic ointment in one wound and watching it come out the other. I have talked to her in some detail today. I had given her names of orthopedic surgeons at [facility] for second opinion on what to do about the right leg. I do not think the patient never called them. She has not been able to get a hold of the orthopedic surgeon in [place] that she had put a lot of faith in as being somebody would give her an opinion that she would trust. I talked to her today and said even if I could get her in to another orthopedic surgeon about the leg which she accept an amputation and she said she would not therefore I am not going to press this issue for the moment 12/14; continued improvement in his substantial wound on the right medial foot. There is still a necrotic area inferiorly with tightly adherent necrotic debris which I have been working on debriding each time she is here. She does not have an orthopedic appointment. Since last time she was here I looked over her cultures which were essentially MRSA on the foot wound and gram- negative rods in the abscess on the anterior leg. 12/21; continued improvement in the area on the right medial foot. She is not up on this much and that is probably a good thing since I do not know it could support continuous ambulation. She has a small area on the right lateral leg which were remanence of the IandD's I did because of the abscess. I think she should probably have  prophylactic antibiotics I am going to have to look this over to see if we can make an intelligent decision here. In the meantime her major wound is come down nicely. Necrotic area inferiorly is still there but looks a lot better Electronic Signature(s) Signed: 03/24/2019 7:47:18 AM By: Linton Ham MD Entered By: Linton Ham on 03/23/2019 13:50:15 -------------------------------------------------------------------------------- Physical Exam Details Patient Name: Date of Service: [name] 03/23/2019 12:45 PM Medical Record Number:[id number] Patient Account Number: [id number] Date of Birth/Sex: Treating RN: 1970/11/30 (48 y.o. F) Primary Care Provider: [name] Other Clinician: Referring Provider: Treating Provider/Extender:, [name], [name] in Treatment: 28 Constitutional Patient is hypertensive.. Pulse regular and within target range for patient.Marland Kitchen Respirations regular, non-labored and within target range.. Temperature is normal and within the target range for the patient.Marland Kitchen Appears in no distress. Respiratory work of breathing is normal. Cardiovascular Pedal pulses are palpable. Integumentary (Hair, Skin) No erythema  around the wound.. No palpable abnormalities in the right lateral leg which were previously the sites of her abscess. Psychiatric appears at normal baseline. Notes Wound exam; continued improvement. Even the distal area which still has some necrotic debris did not require debridement today. She has a small open area on the right lateral calf remanence of IandD's because of the abscess this looks healthy. Electronic Signature(s) Signed: 03/24/2019 7:47:18 AM By: Linton Ham MD Entered By: Linton Ham on 03/23/2019 13:51:29 -------------------------------------------------------------------------------- Physician Orders Details Patient Name: Date of Service: [name] 03/23/2019 12:45 PM Medical Record  Number:[id number] Patient Account Number: [id number] Date of Birth/Sex: Treating RN: 1971/02/26 (48 y.o. [name] Primary Care Provider: [name] Other Clinician: Referring Provider: Treating Provider/Extender:, [name], [name] in Treatment: 60 Verbal / Phone Orders: No Diagnosis Coding ICD-10 Coding Code Description [id number] Other chronic osteomyelitis, right ankle and foot L97.514 Non-pressure chronic ulcer of other part of right foot with necrosis of bone E10.621 Type 1 diabetes mellitus with foot ulcer L02.415 Cutaneous abscess of right lower limb L97.812 Non-pressure chronic ulcer of other part of right lower leg with fat layer exposed Follow-up Appointments Return Appointment in 2 weeks. Dressing Change Frequency Wound #43 Right,Medial Foot Change dressing three times week. Wound #47 Right,Anterior Lower Leg Change dressing three times week. Wound Cleansing Clean wound with Wound Cleanser - Anasept to all wounds Primary Wound Dressing Wound #43 Right,Medial Foot Calcium Alginate with Silver Wound #47 Right,Anterior Lower Leg Calcium Alginate with Silver Secondary Dressing Wound #43 Right,Medial Foot Kerlix/Rolled [name] ABD pad Wound #47 Right,Anterior Lower Leg Kerlix/Rolled [name] ABD pad Edema Control Elevate legs to the level of the heart or above for 30 minutes daily and/or when sitting, a frequency of: - throughout the day [place] skilled [facility] for wound care. - Interim Electronic Signature(s) Signed: 03/23/2019 5:05:42 PM By: Baruch Gouty RN, BSN Signed: 03/24/2019 7:47:18 AM By: Linton Ham MD Entered By: Baruch Gouty on 03/23/2019 13:37:56 -------------------------------------------------------------------------------- Problem List Details Patient Name: Date of Service: [name] 03/23/2019 12:45 PM Medical Record Number:[id number] Patient Account Number: [id number] Date of  Birth/Sex: Treating RN: [birth year] (48 y.o. [name], [name] Primary Care Provider: [name] Other Clinician: Referring Provider: Treating Provider/Extender:, [name], [name] in Treatment: 28 Active Problems ICD-10 Evaluated Encounter Code Description Active Date Today Diagnosis M86.671 Other chronic osteomyelitis, right ankle and foot 09/03/2018 No Yes L97.514 Non-pressure chronic ulcer of other part of right foot 09/03/2018 No Yes with necrosis of bone E10.621 Type 1 diabetes mellitus with foot ulcer 09/24/2018 No Yes L02.415 Cutaneous abscess of right lower limb 12/25/2018 No Yes L97.812 Non-pressure chronic ulcer of other part of right lower 02/24/2019 No Yes leg with fat layer exposed Inactive Problems Resolved Problems Electronic Signature(s) Signed: 03/24/2019 7:47:18 AM By: Linton Ham MD Entered By: Linton Ham on 03/23/2019 13:48:50 -------------------------------------------------------------------------------- Progress Note Details Patient Name: Date of Service: [name] 03/23/2019 12:45 PM Medical Record Number:[id number] Patient Account Number: [id number] Date of Birth/Sex: Treating RN: 05/12/1970 (48 y.o. F) Primary Care Provider: [name] Other Clinician: Referring Provider: Treating Provider/Extender:, [name], [name] in Treatment: 28 Subjective History of Present Illness (HPI) 48 year old diabetic who is known to have type 1 diabetes which is poorly controlled last hemoglobin A1c was 11%. She comes in with a ulcerated area on the left lateral foot which has been there for over 6 months. Was recently she has been treated by [name] of podiatry who saw her  last on 05/28/2016. Review of his notes revealed that the patient had incision and drainage with placement of antibiotic beads to the left foot on 04/11/2016 for possible osteomyelitis of the cuboid bone. Over the last year she's had a  history of amputation of the left fifth toe and a femoropopliteal popliteal bypass graft somewhere in April 2017. 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 His note [name] mentions that the patient has been referred to me for further wound care and possibly great candidate for hyperbaric oxygen therapy due to recurrent osteomyelitis. However we do not have any x-rays of biopsy reports confirming this. He has been on several antibiotics including Bactrim and most recently is on doxycycline for an MRSA. I understand, the patient was not a candidate for IV antibiotics as she has had previous PICC lines which resulted in blood clots in both arms. There was a x-ray report dated 04/04/2016 on [name] notes which showed evidence of fifth ray resection left foot with osteolytic changes noted to the fourth metatarsal and cuboid bone on the left. 06/13/2016 -- had a left foot x-ray which showed no acute fracture or dislocation and no definite radiographic evidence of osteomyelitis. Advanced osteopenia was seen. 06/20/2016 -- she has noticed a new wound on the right plantar foot in the region where she had a callus before. 06/27/16- the patient did have her x-ray of the right foot which showed no findings to suggest osteomyelitis. She saw her endocrinologist, [name], yesterday. Her A1c in January was 11. He also indicates mismanagement and noncompliance regarding her diabetes. She is currently on Bactrim for a lip infection. She is complaining of nausea, vomiting and diarrhea. She is unable to articulate the exact orders or dosing of the Bactrim; it is unclear when she will complete this. 07/04/2016 -- results from [facility] of ABIs with ankle waveforms were noted from 02/14/2016. The examination done on 06/27/2015 showed noncompressible ABIs with the right being 1.45 and the left being 1.33. The present examination showed a right ABI of 1.19 on the left of 1.33. The conclusion was  that right normal ABI in the lower extremity at rest however compared to previous study which was noncompressible ABI may be falsely elevated side suggesting medial calcification. The left ABI suggested medial calcification. 08/01/2016 -- the patient had more redness and pain on her right foot and did not get to come to see as noted she see her PCP or go to the ER and decided to take some leftover metronidazole which she had at home. As usual, the patient does report she feels and is rather noncompliant. 08/08/2016 -- -- x-ray of the right foot -- FINDINGS:Transmetatarsal amputation is noted. No bony destruction is noted to suggest osteomyelitis. IMPRESSION: No evidence of osteomyelitis. Postsurgical changes are seen. MRI would be more sensitive for possible bony changes. Culture has grown Serratia Marcescens -- sensitive to Bactrim, ciprofloxacin, ceftazidime she was seen by [name] on 08/06/2016. He did not find any exposed bone, muscle, tendon, ligament or joint. There was no malodor and he did a excisional debridement in the office. ============ Old notes: 48 year old patient who is known to the wound clinic for a while had been away from the wound clinic since 09/01/2014. Over the last several months she has been admitted to various hospitals including [place] [facility]. She was treated for a right metatarsal osteomyelitis with a transmetatarsal amputation and this was done about 2 months ago. He has a small ulcerated  area on the right heel and she continues to have an ulcerated area on the left plantar aspect of the foot. The patient was recently admitted to the [facility] hospital group between 7/12 and 10/18/2014. she was given 3 weeks of IV vancomycin and was to follow-up with her surgeons at [facility] and also took oral vancomycin for C. difficile colitis. Past medical history is significant for type 1 diabetes mellitus with neurological manifestations and  uncontrolled cellulitis, DVT of the left lower extremity, C. 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 MRI of the left foot done on 10/14/2014 showed no abscess or osteomyelitis. 04/27/15; this is a patient we know from previous stays in the wound care center. She is a type I diabetic I am not sure of her control currently. Since the last time I saw her she is had a right transmetatarsal amputation and has no wounds on her right foot and has no open wounds. She is been followed at the wound care center at [facility] in [place]. She comes today with the desire to undergo hyperbaric treatment locally. Apparently one of her wound care providers in [place] has suggested hyperbarics. This is in response to an MRI from 04/18/15 that showed increased marrow signal and loss of the proximal fifth metatarsal cortex evidence of osteomyelitis with likely early osteomyelitis in the cuboid bone as well. She has a large wound over the base of the fifth metatarsal. She also has a eschar over her the tips of her toes on 1,3 and 5. She does not have peripheral pulses and apparently is going for an angiogram tomorrow which seems reasonable. After this she is going to infectious disease at [facility]. They have been using Medihoney to the large wound on the lateral aspect of the left foot to. The patient has known Charcot deformity from diabetic neuropathy. She also has known diabetic PAD. Surprisingly I can't see that she has had any recent antibiotics, the patient states the last antibiotic she had was at the end of November for 10 days. I think this was in response to culture that showed group G strep although I'm not exactly sure where the culture was from. She is also had arterial studies on 03/29/15. This showed a right ABI of 1.4 that was noncompressible. Her left ABI was 0.73.  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 It was not felt that arterial inflow was adequate for healing of a foot ulcer. Her Doppler waveforms looked monophasic ===== READMISSION 02/28/17; this is in an now 48 year old woman we've had at several different occasions in this clinic. 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 She has a remote ex-smoker. She was last seen in this clinic by Dr. Con Memos I think in May. More recently she is been followed by her podiatrist [name] an infectious disease Dr. Megan Salon. She has 2 open wounds the major one is over the right first metatarsal head she also has a wound on the left plantar foot. an MRI of the right foot on 01/01/17 showed a soft tissue ulcer along the plantar aspect of the first metatarsal base consistent with osteomyelitis of the first metatarsal stump. Dr. 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 According to the patient this is been open for slightly over a month. She has been on a combination  of Cipro 500 twice a day, Zyvox 600 twice a day and Flagyl 500 3 times a day for over a month now as directed by Dr. Megan Salon. cultures of the right foot earlier this year showed MRSA in January and Serratia in May. January also had a few viridans strep. Recent x-rays of both feet were done and [name] office and I don't have these reports. The patient has known PAD and has a history of aleft femoropopliteal bypass in April 2017. 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 Dwyane Dee. In spite of this the last hemoglobin A1c I can see is 10.1 on 01/01/2017. She is being referred by [name] for consideration of hyperbaric oxygen for chronic refractory osteomyelitis involving the right first metatarsal  head with a Wagner 3 wound over this area. She is been using Medihoney to this area and also an area on the left midfoot. She is using healing sandals bilaterally. ABIs in this clinic at the left posterior tibial was 1.1 noncompressible on the right READMISSION Non invasive vascular NOVANT 5/18 Aftercare following surgery of the circulatory system Procedure Note - Interface, External Ris In - 08/13/2016 11:05 AM EDT Procedure: Examination consists of physiologic resting arterial pressures of the brachial and ankle arteries bilaterally with continuous wave Doppler waveform analysis. Previous: Previous exam performed on 02/14/16 demonstrated ABIs of Rt = 1.19 and Lt = 1.33. Right: ABI = non-compressible PT, 1.47 DP. S/P transmet amputation. Left: ABI = 1.52, 2nd digit pressure = 87 mmHg Conclusions: Right: ABI (>1.3) may be falsely elevated, suggesting medial calcification. Left: ABI (>1.3) may be falsely elevated, suggesting medial calcification The patient is a now 48 year old type I diabetic is had multiple issues her graded to chronic diabetic foot ulcers. 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 We had her in the clinic lastin November. At that point she had wounds on her bilateral feet.she had wanted to try hyperbarics however the healogics review process denied her because she hadn't followed up with her vascular surgeon for her left femoropopliteal bypass. The bypass was done by Dr. Raul Del at [facility]. We made her a follow-up with Dr. Raul Del however she did not keep the appointment and therefore she was not approved The patient shows me a small wound on her left fourth metatarsal head on her phone. She developed rapid discoloration in the plantar aspect of the left foot and she was admitted to hospital from 2/2 through 05/10/17 with wet gangrene of the left foot osteomyelitis of the fourth metatarsal  heads. She was admitted acutely ill with a temperature of 103. She was started on broad-spectrum vancomycin and cefepime. On 05/06/17 she was taken to the OR by [name] her podiatric surgeon for an incision and drainage irrigation of the left foot wound. Cultures from this surgery revealed group be strep and anaerobes. she was seen by Dr.Xu of orthopedic surgery and scheduled for a below-knee amputation which she u refused. Ultimately she was discharged on Levaquin and Flagyl for one month. MRI 05/05/17 done while she was in the hospital showed abscess adjacent to the fourth metatarsal head and neck small abscess around the fourth flexor tendon. Inflammatory phlegmon and gas in the soft tissues along the lateral aspect of the fourth phalanx. Findings worrisome for osteomyelitis involving the fourth proximal and middle phalanx  and also the third and fourth metatarsals. Finally the patient had actually shortly before this followed up with Dr. Raul Del at no time on 04/29/17. He felt that her left femoropopliteal bypass was patent he felt that her left-sided toe pressures more than adequate for healing a wound on the left foot. This was before her acute presentation. 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 05/28/17; she is started hyperbarics. The patient tells me that for some reason she was not actually on Levaquin but I think on ciprofloxacin. She was on Flagyl. She only started her Levaquin yesterday due to some difficulty with the pharmacy and perhaps her sister picking it up. She has an appointment with [name] tomorrow and with infectious disease early next week. She has no new complaints 06/06/17; the patient continues in hyperbarics. She saw [name] on 05/29/17 who is her podiatric surgeon. He is elected for a transmetatarsal amputation on 06/27/17. I'm not sure at what level he plans to do this amputation. The patient is unaware ooShe also saw Dr. Megan Salon of infectious disease who elected  to continue her on current antibiotics I think this is ciprofloxacin and Flagyl. I'll need to clarify with her tomorrow if she actually has this. We're using silver alginate to the actual wound. Necrotic surface today with material under the flap of her foot. ooOriginal MRI showed abscesses as well as osteomyelitis of the proximal and middle fourth phalanx and the third and fourth metatarsal heads 06/11/17; patient continues in hyperbarics and continues on oral antibiotics. She is doing well. The wound looks better. The necrotic part of this under the flap in her superior foot also looks better. she is been to see [name]. I haven't had a chance to look at his note. Apparently he has put the transmetatarsal amputation on hold her request it is still planning to take her to the OR for debridement and product application ACEL. I'll see if I can find his note. I'll therefore leave product ordering/requests to [name] for now. I was going to look at Dermagraft 06/18/17-she is here in follow-up evaluation for bilateral foot wounds. She continues with hyperbaric therapy. She states she has been applying manuka honey to the right plantar foot and alternate manuka honey and silver alginate to the left foot, despite our orders. We will continue with same treatment plan and she will follo up next week. 06/25/17; I have reviewed [name] last note from 3/11. She has operative debridement in 2 days' time. By review his note apparently they're going to place there is skin over the majority of this wound which is a good choice. She has a small satellite area at the most proximal part of this wound on the left plantar foot. The area on the right plantar foot we've been using silver alginate and it is close to healing. 07/02/17; unfortunately the patient was not easily approved for [name] proposed surgery. I'm not completely certain what the issue is. She has been using silver alginate to the wound she has  completed a first course of hyperbarics. She is still on Levaquin and Flagyl. I have really lost track of the time course here.I suspect she should have another week to 2 of antibiotics. I'll need to see if she is followed up with infectious disease Dr. Megan Salon 07/09/17; the patient is followed up with Dr. Megan Salon. 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 She continues on Levaquin and metronidazole continuing both  of these for now I think she is been on fr about 6 weeks. She still has some drainage but no pain. No fever. Her had been plans for her to go to the OR for operative debridement with her podiatrist [name], I am not exactly sure where that is. I'll probably slip a note to [name] today. I note that she follows with Dr. Dwyane Dee of endocrinology. We have her recertified for hyperbaric oxygen. I have not heard about Dermagraft however I'll see if [name] is planning a skin substitute as well 07/16/17; the patient tells me she is just about out of [place]. I'll need to check Dr. Hale Bogus last notes on this. She states she has plenty of Flagyl however. She comes in today complaining of pain in the right lateral foot which she said lasted for about a day. The wound on the right foot is actually much more medially. She also tells me that the [facility] cost a lot of pain in the left foot wound and she turned back to silver alginate. Finally Dermagraft has a $086 per application co-pay. She cannot afford this 07/23/17; patient arrives today with the wound not much smaller. There is not much new to add. She has not heard from [name] all try to put in a call to them today. She was asking about Dermagraft again and she has an over $578 per application co-pay she states that she would be willing to try to do a payment plan. I been tried to avoid this. We've been using silver alginate, I'll change to [facility] 07/30/17-She is here in  follow-up evaluation for left foot ulcer. She continues hyperbaric medicine. The left foot ulcer is stable we will continue with same treatment plan 08/06/17; she is here for evaluation of her left foot ulcer. Currently being treated for hyperbarics or underlying osteomyelitis. She is completed antibiotics. The left foot ulcer is better smaller with healthier looking granulation. For various reasons I am not really clear on we never got her back to the OR with [name]. He did not respond to my secure text message. Nevertheless I think that surgery on this point is not necessary nor am I completely clear that a skin substitute is necessary The patient is complaining about pain on the outside of her right foot. 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 There is no erythema. She also states the foot is warm versus her other part of her upper leg and this is largely true. It is not totally clear to me what's causing this. She thinks it's different from her usual neuropathy pain 08/13/17; she arrives in clinic today with a small wound which is superficial on her right first metatarsal head. 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 She tells [place] she was up on her feet over the Mother's Day celebration. ooThe large wound is on the left foot. Continues with hyperbarics for underlying osteomyelitis. We're using Hydrofera Blue. She asked me today about where we were with Dermagraft. I had actually excluded this because of the co- pay however she wants to assume this therefore I'll recheck the co-pay an order for next week. 08/20/17; the patient agreed to accept the co-pay of the first Apligraf which we applied today. She is disappointed she is finishing hyperbarics will run this through the insurance on the extent of the foot infection and the extent of the wound that she had however she is already had 60 dive's. Dermagraft No. 1  08/27/17; Dermagraft No. 2. She is not  eligible for any more hyperbaric treatments this month. She reports a fair amount of drainage and she actually changed to the external dressings without disturbing the direct contact layer 09/03/17; the patient arrived in clinic today with the wound superficially looking quite healthy. Nice vibrant red tissue with some advancing epithelialization although not as much adherence of the flap as I might like. However she noted on her own fourth toe some bogginess and she brought that to our attention. Indeed this was boggy feeling like a possibility of subcutaneous fluid. 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 She is not been unwell. We've been using Dermagraft 09/10/17; the culture that I did not last week was MRSA. She saw Dr. Megan Salon this morning who is going to start her on vancomycin. I had sent him a secure a text message yesterday. I also spoke with her podiatric surgeon [name] about surgery on this foot the options for conserving a functional foot etc. Promised me he would see her and will make back consultation today. Paradoxically her actual wound on the plantar aspect of her left foot looks really quite good. I had given her 5 days worth of Baxdella to cover her for MRSA. Her MRI came back showing osteomyelitis within the third metatarsal shaft and head and base of the third and fourth proximal phalanx. She had extensive inflammatory changes throughout the soft tissue of the lateral forefoot. With an ill-defined fluid around the fourth metatarsal extending into the plantar and dorsal soft tissues 09/19/17; the patient is actually on oral Septra and Flagyl. She apparently refused IV vancomycin. She also saw [name] at my request who is planning her for a left BKA sometime in mid July. MRI showed osteomyelitis within the third metatarsal shaft and head and the basis of the third and fourth proximal phalanx. I believe  there was felt to be possible septic arthritis involving the third MTP. 09/26/17; the patient went back to Dr. Megan Salon at my suggestion and is now receiving IV daptomycin. Her wound continues to look quite good making the decision to proceed with a transmetatarsal amputation although more difficult for the patient. I believe in my extensive discussions with her she has a good sense of the pros and cons of this. I don't NV the tuft decision she has to make. She has an appointment with [name] I believe in mid July and I previously spoken to him about this issue Has we had used 3 previous Dermagraft. Given the condition of the wound surface I went ahead and added the fourth one today area and I did this not fully realizing that she'll be traveling to [place] next week. I'm hopeful she can come back in 2 weeks 10/21/17; Her same Dermagraft on for about 3-1/2 weeks. In spite of this the wound arrives looking quite healthy. There is been a lot of healing dimensions are smaller. Looking at the square shaped wound she has now there is some undermining and some depth medially under the undermining although I cannot palpate any bone. No surrounding infection is obvious. She has difficult questions about how to look at this going forward vis--vis amputations versus continued medical therapy. To be truthful the wound is looks so healthy and it is continued to contract. Hard to justify foot surgery at this point although I still told her that I think it might  come to that if we are not able to eradicate the underlying MRSA. She is still highly at risk and she understands this 11/06/17 on evaluation today patient appears to be doing better in regard to her foot ulcer. She's been tolerating the dressing changes without complication. Currently she is here for her Dermagraft #6. Her wound continues to make excellent progress at this point. She does not appear to have any evidence of infection which is good  news. 11/13/17 on evaluation today patient appears to be doing excellent at this time. She is here for repeat Dermagraft application. This is #7. Overall her wound seems to be making great progress. 12/05/17; the patient arrives with the wound in much better condition than when I last saw this almost 6 weeks ago. She still has a small probing area in the left metatarsal head region on the lateral aspect of her foot. We applied her last Dermagraft today. ooSince the last time she is here she has what appears to a been a blood blister on the plantar aspect of left foot although I don't see this is threatening. There is also a thick raised tissue on the right mid metatarsal head region. This was not there I don't think the last time she was here 3 weeks ago. 12/12/17; the patient continues to have a small programming area in the left metatarsal head region on the lateral aspect of her foot which was the initial large surgical wound. I applied her last Apligraf last week. I'm going to use Endoform starting today ooUnfortunately she has an excoriated area in the left mid foot and the right mid foot. The left midfoot looks like a blistered area this was not opened last week it certainly is open today. Using silver alginate on these areas. She promises me she is offloading this. 12/19/17; the small probing area in the left metatarsal head eyes think is shallower. In general her original wound looks better. We've been using Endoform. The area inferiorly that I think was trauma last week still requires debridement a lot of nonviable surface which I removed. She still has an open open area distally in her foot ooSimilarly on the right foot there is tightly adherent surface debris which I removed. Still areas that don't look completely epithelialized. This is a small open area. We used silver alginate on these areas 12/26/2017; the patient did not have the supplies we ordered from last week including the  Endoform. The original large wound on the left lateral foot looks healthy. She still has the undermining area that is largely unchanged from last week. She has the same heavily callused raised edged wounds on the right mid and left midfoot. Both of these requiring debridement. We have been using silver alginate on these areas 01/02/2018; there is still supply issues. We are going to try to use Prisma but I am not sure she actually got it from what she is saying. She has a new open area on the lateral aspect of the left fourth toe [previous fifth ray amputation]. Still the one tunneling area over the fourth metatarsal head. The area is in the midfoot bilaterally still have thick callus around them. She is concerned about a raised swelling on the lateral aspect of the foot. However she is completely insensate 01/10/2018; we are using Prisma to the wounds on her bilateral feet. Surprisingly the tunneling area over the left fourth metatarsal head that was part of her original surgery has closed down. She has a small open  area remaining on the incision line. 2 open areas in the midfoot. 02/10/2018; the patient arrives back in clinic after a month hiatus. She was traveling to visit family in [place]. Is fairly clear she was not offloading the areas on her feet. The original wound over the left lateral foot at the level of metatarsal heads is reopened and probes medially by about a centimeter or 2. She notes that a week ago she had purulent drainage come out of an area on the left midfoot. Paradoxically the worst area is actually on the right foot is extensive with purulent drainage. We will use silver alginate today 02/17/2018; the patient has 3 wounds one over the left lateral foot. She still has a small area over the metatarsal heads which is the remnant of her original surgical wound. This has medial probing depth of roughly 1.4 cm somewhat better than last week. The area on the right foot is larger.  We have been using silver alginate to all areas. The area on the right foot and left foot that we cultured last week showed both Klebsiella and Proteus. Both of these are quinolone sensitive. The patient put her's self on Bactrim and Flagyl that she had left hanging around from prior antibiotic usages. She was apparently on this last week when she arrived. I did not realize this. Unfortunately the Bactrim will not cover either 1 of these organisms. We will send in Cipro 500 twice daily for a week 03/04/2018; the patient has 2 wounds on the left foot one is the original wound which was a surgical wound for a deep DFU. At one point this had exposed bone. She still has an area over the fourth metatarsal head that probes about 1.4 cm although I think this is better than last week. I been using silver nitrate to try and promote tissue adherence and been using silver alginate here. ooShe also has an area in the left midfoot. This has some depth but a small linear wound. Still requiring debridement. ooOn the right midfoot is a circular wound. A lot of thick callus around this area. ooWe have been using silver alginate to all wound areas ooShe is completed the ciprofloxacin I gave her 2 weeks ago. 03/11/2018; the patient continues to have 2 open areas on the left foot 1 of which was the original surgical wound for a deep DFU. Only a small probing area remains although this is not much different from last week we have been using silver alginate. The other area is on the midfoot this is smaller linear but still with some depth. We have been using silver alginate here as well ooOn the right foot she has a small circular wound in the mid aspect. This is not much smaller than last time. We have been using silver alginate here as well 03/18/2018; she has 3 wounds on the left foot the original surgical wound, a very superficial wound in the mid aspect and then finally the area in the mid plantar foot. She  arrives in today with a very concerning area in the wound in the mid plantar foot which is her most proximal wound. There is undermining here of roughly 1-1/2 cm superiorly. Serosanguineous drainage. She tells me she had some pain on for over the weekend that shot up her foot into her thigh and she tells me that she had a nodule in the groin area. ooShe has the single wound in the right foot. ooWe are using endoform to both wound areas  03/24/2018; the patient arrives with the original surgical wound in the area on the left midfoot about the same as last week. There is a collection of fluid under the surface of the skin extending from the surgical wound towards the midfoot although it does not reach the midfoot wound. The area on the right foot is about the same. Cultures from last week of the left midfoot wound showed abundant Klebsiella abundant Enterococcus faecalis and moderate methicillin resistant staph I gave her Levaquin but this would have only covered the Klebsiella. She will need linezolid 04/01/2018; she is taking linezolid but for the first few days only took 1 a day. I have advised her to finish this at twice daily dosing. In any case all of her wounds are a lot better especially on the left foot. The original surgical wound is closed. The area on the left midfoot considerably smaller. The area on the right foot also smaller. 04/08/2018; her original surgical wound/osteomyelitis on the left foot remains closed. She has area on the left foot that is in the midfoot area but she had some streaking towards this. This is not connected with her original wound at least not visually. ooSmall wound on the right midfoot appears somewhat smaller. 04/15/18; both wounds looks better. Original wound is better left midfoot. Using silver alginate 1/21; patient states she uses saltwater soak in, stones or remove callus from around her wounds. She is also concerned about a blood blister she had on  the left foot but it simply resolved on its own. We've been using silver alginate 1/28; the patient arrives today with the same streaking area from her metatarsals laterally [the site of her original surgical wound] down to the middle of her foot. There is some drainage in the subcutaneous area here. This concerns me that there is actually continued ongoing infection in the metatarsals probably the fourth and third. This fixates an MRI of the foot without contrast [chronic renal failure] ooThe wound in the mid part of the foot is small but I wonder whether this area actually connects with the more distal foot. ooThe area on the right midfoot is probably about the same. Callus thick skin around the small wound which I removed with a curette we have been using silver alginate on both wound areas 2/4; culture I did of the draining site on the left foot last time grew methicillin sensitive staph aureus. MRI of the left foot showed interval resolution of the findings surrounding the third metatarsal joint on the prior study consistent with treated osteomyelitis. Chronic soft tissue ulceration in the plantar and lateral aspect of the forefoot without residual focal fluid collection. No evidence of recurrent osteomyelitis. 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 She tells me she had nausea and vomiting over the weekend and missed 2 days. In general her foot looks much the same. She has a small open area just below the left fourth metatarsal head. A linear area in the left midfoot. Some discoloration extending from the inferior part of this into the left lateral foot although this appears to be superficial. She has a small area on the right midfoot which  generally looks smaller after debridement 2/18; the patient is completing his cephalexin and has another 2  days. She continues to have open areas on the left and right foot. 2/25; she is now off antibiotics. The area on the left foot at the site of her original surgical wound has closed yet again. She still has open areas in the mid part of her foot however these appear smaller. The area on the right mid foot looks about the same. We have been using silver alginate She tells me she had a serious hypoglycemic spell at home. She had to have EMS called and get IV dextrose 3/3; disappointing on the left lateral foot large area of necrotic tissue surrounding the linear area. This appears to track up towards the same original surgical wound. Required extensive debridement. The area on the right plantar foot is not a lot better also using silver 3/12; the culture I did last time showed abundant enterococcus. I have prescribed Augmentin, should cover any unrecognized anaerobes as well. In addition there were a few MRSA and Serratia that would not be well covered although I did not want to give her multiple antibiotics. She comes in today with a new wound in the right midfoot this is not connected with the original wound over her MTP a lot of thick callus tissue around both wounds but once again she said she is not walking on these areas 3/17-Patient comes in for follow-up on the bilateral plantar wounds, the right midfoot and the left plantar wound. Both these are heavily callused surrounding the wounds. We are continuing to use silver alginate, she is compliant with offloading and states she uses a wheelchair fairly often at home 3/24; both wound areas have thick callus. However things actually look quite a bit better here for the majority of her left foot and the right foot. 3/31; patient continues to have thick callused somewhat irritated looking tissue around the wounds which individually are fairly  superficial. There is no evidence of surrounding infection. We have been using silver alginate however I change that to [facility] today 4/17; patient returns to clinic after having a scare with Covid she tested negative in her primary doctor's office. She has been using Hydrofera Blue. She does not have an open area on the right foot. On the left foot she has a small open area with the mid area not completely viable. She showed me pictures of what looks like a hemorrhagic blister from several days ago but that seems to have healed over this was on the lateral left foot 4/21; patient comes in to clinic with both her wounds on her feet closed. However over the weekend she started having pain in her right foot and leg up into the thigh. She felt as though she was running a low-grade fever but did not take her temperature. She took a doxycycline that she had leftover and yesterday a single Septra and metronidazole. She thinks things feel somewhat better. 4/28; duplex ultrasound I ordered last week was negative for DVT or superficial thrombophlebitis. She is completed the doxycycline I gave her. States she is still having a lot of pain in the right calf and right ankle which is no better than last week. She cannot sleep. She also states she has a temperature of up to 101, coughing and complaining of visual loss in her bilateral eyes. Apparently she was tested for Covid 2 weeks ago at [facility] and that was negative. Readmission: 09/03/18 patient presents back for reevaluation after having been evaluated at the end of April regarding erythema and swelling of her right  lower extremity. Subsequently she ended up going to the hospital on 07/29/18 and was admitted not to be discharged until 08/08/18. Unfortunately it was noted during the time that she was in the hospital that she did have methicillin-resistant Staphylococcus aureus as the infection noted at the site. It was also determined that she did have  osteomyelitis which appears to be fairly significant. She was treated with vancomycin and in fact is still on IV vancomycin at dialysis currently. This is actually slated to continue until 09/12/18 at least which will be the completion of the six weeks of therapy. Nonetheless based on what I'm seeing at this point I'm not sure she will be anywhere near ready to discontinue antibiotics at that time. Since she was released from the hospital she was seen by [name] who is her podiatrist on 08/27/18. His note specifically states that he is recommended that the patient needs of one knee amputation on the right as she has a life-threatening situation that can lead quickly to sepsis. The patient advised she would like to try to save her leg to which [name] apparently told her that this was against all medical advice. She also want to discontinue the Wound VAC which had been initiated due to the fact that she wasn't pleased with how the wound was looking and subsequently she wanted to pursue applying Medihoney at that time. He stated that he did not believe that the right lower extremity was salvageable and that the patient understood but would still like to attempt hyperbaric option therapy if it could be of any benefit. She was therefore referred back to [place] for further evaluation. He plans to see her back next week. Upon inspection today patient has a significant amount purulent drainage noted from the wound at this point. The bone in the distal portion of her foot also appears to be extremely necrotic and spongy. When I push down on the bone it bubbles and seeps purulent drainage from deeper in the end of the foot. I do not think that this is likely going to heal very well at all and less aggressive surgical debridement were undertaken more than what I believe we can likely do here in our office. 09/12/2018; I have not seen this patient since the most recent hospitalization although she was in our  clinic last week. I have reviewed some of her records from a complex hospitalization. 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 There is situation was complicated by MRSA bacteremia and acute on chronic renal failure now on dialysis. She is receiving vancomycin at dialysis. We started her on Dakin's wet-to- dry last week she is changing this daily. There is still purulent drainage coming out of her foot. Although she is apparently "agreeable" to a below-knee amputation which is been suggested by multiple clinicians she wants this to be done in [place]. She apparently has a telehealth visit with that provider sometime in late [place] 6/24. I have told her I think this is probably too long. Nevertheless I could not convince her to allow a local doctor to perform BKA. 09/19/2018; the patient has a large necrotic area on the right anterior foot. She has had previous transmetatarsal amputations. Culture I did last week showed MRSA nothing else she is on vancomycin at dialysis. She has continued leaking purulent drainage out of the distal part of the large circular wound on the right anterior foot. She apparently went to see Dr. Berenice Primas  of orthopedics to discuss scheduling of her below-knee amputation. Somehow that translated into her being referred to plastic surgery for debridement of the area. I gather she basically refused amputation although I do not have a copy of Dr. Berenice Primas notes. The patient really wants to have a trial of hyperbaric oxygen. I agreed with initial assessment in this clinic that this was probably too far along to benefit however if she is going to have plastic surgery I think she would benefit from ancillary hyperbaric oxygen. The issue here is that the patient has benefited as maximally as any patient I have ever seen from hyperbaric oxygen therapy. Most recently she had exposed bone on the lateral part of her left foot after a  surgical procedure and that actually has closed. She has eschared areas in both heels but no open area. She is remained systemically well. I am not optimistic that anything can be done about this but the patient is very clear that she wants an attempt. The attempt would include a wound VAC further debridements and hyperbaric oxygen along with IV antibiotics. 6/26; I put her in for a trial of hyperbaric oxygen only because of the dramatic response she has had with wounds on her left midfoot earlier this year which was a surgical wound that went straight to her bone over the metatarsal heads and also remotely the left third toe. We will see if we can get this through our review process and insurance. She arrives in clinic with again purulent material pouring out of necrotic bone on the top of the foot distally. There is also some concerning erythema on the front of the leg that we marked. It is bit difficult to tell how tender this is because of neuropathy. I note from infectious disease that she had her vancomycin extended. 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 She had the wound VAC on for part of the week. The rest of the time she is putting various things on this including Medihoney, "ionized water" silver sorb gel etc. 7/7; follow-up along with HBO. She is still on vancomycin at dialysis. She has a large open area on the dorsal right foot and a small dark eschar area on her heel. There is a lot less erythema in the area and a lot less tenderness. From an infection point of view I think this is better. She still has a lot of necrosis in the remaining right forefoot [previous TMA] we are still using the wound VAC in this area 7/16; follow-up along with HBO. I put her on linezolid after she finished her vancomycin. We started this last Friday I gave her 2 weeks worth. I had the expectation that she would be operatively debrided by Dr. Marla Roe but that still has  not happened yet. Patient phoned the office this week. She arrives for review today after HBO. The distal part of this wound is completely necrotic. Nonviable pieces of tendon bone was still purulent drainage. Also concerning that she has black eschar over the heel that is expanding. I think this may be indicative of infection in this area as well. She has less erythema and warmth in the ankle and calf but still an abnormal exam 7/21 follow-up along with HBO. I will renew her linezolid after checking a CBC with differential monitoring her blood counts especially her platelets. She was supposed to have surgery yesterday but if I am reading things correctly this was canceled after her blood sugar was  found to be over 500. I thought Dr. Marla Roe who called me said that they were sending her to the ER but the patient states that was not the case. 7/28. Follow-up along with HBO. She is on linezolid I still do not have any lab work from dialysis even though I called last week. The patient is concerned about an area on her left lateral foot about the level of the base of her fifth metatarsal. I did not really see anything that ominous here however this patient is in [place] ability to point out problems that she is sensing and she has been accurate in the past Finally she received a call from Dr. Marla Roe who is referring her to another orthopedic surgeon stating that she is too booked up to take her to the operating room now. Was still using a wound VAC on the foot 8/3 -Follow-up after HBO, she is got another week of linezolid, she is to call [facility] for an appointment, x-rays of both feet were reviewed, the left foot x-ray with third MTP joint osteo- Right foot x-ray widespread osteo-in the right midfoot Right ankle x-ray does not show any active evidence of infection 8/11-Patient is seen after HBO, the wounds on the right foot appear to be about the same, the heel wound had some necrotic base over  tendon that was debrided with a curette 8/21; patient is seen after HBO. The patient's wound on her dorsal foot actually looks reasonably good and there is substantial amount of epithelialization however the open area distally still has a lot of necrotic debris partially bone. I cannot really get a good sense of just how deep this probes under the foot. She has been pressuring me this week to order medical maggots through a company in [place] for her. The problem I have is there is not a defined wound area here. On the positive side there is no purulence. She has been to see infectious disease she is still on Septra DS although I have not had a chance to review their notes 8/28; patient is seen in conjunction with HBO. The wounds on her foot continued to improve including the right dorsal foot substantially the, the distal part of this wound and the area on the right heel. We have been using a wound VAC over this chronically. She is still on trimethoprim as directed by infectious disease 9/4; patient is seen in conjunction with HBO. Right dorsal foot wound substantially anteriorly is better however she continues to have a deep wound in the distal part of this that is not responding. We have been using silver collagen under border foam ooArea on the right plantar medial heel seems better. We have been using Hydrofera Blue 12/12/18 on evaluation today patient appears to be doing about the same with regard to her wound based on prior measurements. She does have some necrotic tissue noted on the lateral aspect of the wound that is going require a little bit of sharp debridement today. This includes what appears to be potentially either severely necrotic bone or tendon. Nonetheless other than that she does not appear to have any severe infection which is good news 9/18; it is been 2 weeks since I saw this wound. She is tolerating HBO well. Continued dramatic improvement in the area on the right  dorsal foot. She still has a small wound on the heel that we have been using Hydrofera Blue. She continues with a wound VAC 9/24; patient has to be seen emergently today  with a swelling on her right lateral lower leg. She says that she told Dr. Evette Doffing about this and also myself on a couple of occasions but I really have no recollection of this. She is not systemically unwell and her wound really looked good the last time I saw this. She showed this to providers at dialysis and she was able to verify that she was started on cephalexin today for 5 doses at dialysis. She dialyzes on Tuesday Thursday and Saturday. 10/2; patient is seen in conjunction with HBO. The area that is draining on the right anterior medial tibia is more extensive. Copious amounts of serosanguineous drainage with some purulence. We are still using the wound VAC on the original wound then it is stable. Culture I did of the original IandD showed MRSA I contacted dialysis she is now on vancomycin with dialysis treatments. I asked them to run a month 10/9; patient seen in conjunction with HBO. She had a new spontaneous open area just above the wound on the right medial tibia ankle. More swelling on the right medial tibia. Her wound on the foot looks about the same perhaps slightly better. There is no warmth spreading up her leg but no obvious erythema. her MRI of the foot and ankle and distal tib-fib is not booked for next Friday I discussed this with her in great detail over multiple days. it is likely she has spreading infection upper leg at least involving the distal 25% above the ankle. She knows that if I refer her to orthopedics for infectious disease they are going to recommend amputation and indeed I am not against this myself. We had a good trial at trying to heal the foot which is what she wanted along with antibiotics debridement and HBO however she clearly has spreading infection [probably staph aureus/MRSA].  Nevertheless she once again tells me she wants to wait the left of the MRI. She still makes comments about having her amputation done in [place]. 10/19; arrives today with significant swelling on the lateral right leg. Last culture I did showed Klebsiella. Multidrug- resistant. Cipro was intermediate sensitivity and that is what I have her on pending her MRI which apparently is going to be done on Thursday this week although this seems to be moving back and forth. She is not systemically unwell. We are using silver alginate on her major wound area on the right medial foot and the draining areas on the right lateral lower leg 10/26; MRI showed extensive abscess in the anterior compartment of the right leg also widespread osteomyelitis involving osseous structures of the midfoot and portions of the hindfoot. Also suspicion for osteomyelitis anterior aspect of the distal medial malleolus. Culture I did of the purulence once again showed a multidrug-resistant Klebsiella. I have been in contact with nephrology late last week and she has been started on cefepime at dialysis to replace the vancomycin We sent a copy of her MRI report to Dr. Geroge Baseman in [place] who is an orthopedic surgeon. The patient takes great stock in his opinion on this. She says she will go to [place] to have her leg amputated if Dr. Geroge Baseman does not feel there is any salvage options. 11/2; she still is not talk to her orthopedic surgeon in [place]. Apparently he will call her at [phone] this afternoon. The quality of this is she has not allowed me to refer her anywhere. She has been told over and over that she needs this amputated but has not agreed to be  referred. She tells me her blood sugar was 600 last night but she has not been febrile. 11/9; she never did got a call from the orthopedic surgeon in [place] therefore that is off the radar. We have arranged to get her see orthopedic surgery at [facility]. She still  has a lot of draining purulence coming out of the new abscess in her right leg although that probably came from the osteomyelitis in her right foot and heel. Meanwhile the original wound on the right foot looks very healthy. Continued improvement. The issue is that the last MRI showed osteomyelitis in her right foot extensively she now has an abscess in the right anterior lower leg. There is nobody in [place] who will offer this woman anything but an amputation and to be honest that is probably what she needs. I think she still wants to talk about limb salvage although at this point I just do not see that. She has completed her vancomycin at dialysis which was for the original staph aureus she is still on cefepime for the more recent Klebsiella. She has had a long course of both of these antibiotics which should have benefited the osteomyelitis on the right foot as well as the abscess. 11/16; apparently [place] elective surgery is shut down because of COVID-19 pandemic. I have reached out to some contacts at [facility] to see if we can get her an orthopedic appointment there. I am concerned about continually leaving this but for the moment everything is static. In fact her original large wound on this foot is closing down. It is the abscess on the right anterior leg that continues to drain purulent serosanguineous material. She is not currently on any antibiotics however she had a prolonged course of vancomycin [1 month] as well as cefepime for a month 02/24/2019 on evaluation today patient appears to be doing better than the last time I saw her. This is not a patient that I typically see. With that being said I am covering for Dr. Dellia Nims this week and again compared to when I last saw her overall the wounds in particular seem to be doing significantly better which is good news. With that being said the patient tells me several disconcerting things. She has not been able to get in to see  anyone for potential debridement in regard to her leg wounds although she tells me that she does not think it is necessary any longer because she is taking care of that herself. She noticed a string coming out of the lower wound on her leg over the last week. The patient states that she subsequently decided that we must of pack something in there and started pulling the string out and as it kept coming and coming she realized this was likely her tendon. With that being said she continued to remove as much of this as she could. She then I subsequently proceeded to using tubes of antibiotic ointment which she will stick down into the wound and then scored as much as she can until she sees it coming out of the other wound opening. She states that in doing this she is actually made things better and there is less redness and irritation. With regard to her foot wound she does have some necrotic tendon and tissue noted in one small corner but again the actual wound itself seems to be doing better with good granulation in general compared to my last evaluation. 12/7; continued improvement in the wound on the substantial part of  the right medial foot. Still a necrotic area inferiorly that required debridement but the rest of this looks very healthy and is contracting. She has 2 wounds on the right lateral leg which were her original drainage sites from her abscess but all of this looks a lot better as well. She has been using silver alginate after putting antibiotic biotic ointment in one wound and watching it come out the other. I have talked to her in some detail today. I had given her names of orthopedic surgeons at [facility] for second opinion on what to do about the right leg. I do not think the patient never called them. She has not been able to get a hold of the orthopedic surgeon in [place] that she had put a lot of faith in as being somebody would give her an opinion that she would trust. I  talked to her today and said even if I could get her in to another orthopedic surgeon about the leg which she accept an amputation and she said she would not therefore I am not going to press this issue for the moment 12/14; continued improvement in his substantial wound on the right medial foot. There is still a necrotic area inferiorly with tightly adherent necrotic debris which I have been working on debriding each time she is here. She does not have an orthopedic appointment. Since last time she was here I looked over her cultures which were essentially MRSA on the foot wound and gram- negative rods in the abscess on the anterior leg. 12/21; continued improvement in the area on the right medial foot. She is not up on this much and that is probably a good thing since I do not know it could support continuous ambulation. She has a small area on the right lateral leg which were remanence of the IandD's I did because of the abscess. I think she should probably have prophylactic antibiotics I am going to have to look this over to see if we can make an intelligent decision here. In the meantime her major wound is come down nicely. Necrotic area inferiorly is still there but looks a lot better Objective Constitutional Patient is hypertensive.. Pulse regular and within target range for patient.Marland Kitchen Respirations regular, non-labored and within target range.. Temperature is normal and within the target range for the patient.Marland Kitchen Appears in no distress. Vitals Time Taken: 1:06 PM, Height: 67 in, Weight: 125 lbs, BMI: 19.6, Temperature: 98.6 F, Pulse: 67 bpm, Respiratory Rate: 18 breaths/min, Blood Pressure: 169/78 mmHg. Respiratory work of breathing is normal. Cardiovascular Pedal pulses are palpable. Psychiatric appears at normal baseline. General Notes: Wound exam; continued improvement. Even the distal area which still has some necrotic debris did not require debridement today. ooShe has a small  open area on the right lateral calf remanence of IandD's because of the abscess this looks healthy. Integumentary (Hair, Skin) No erythema around the wound.. No palpable abnormalities in the right lateral leg which were previously the sites of her abscess. Wound #43 status is Open. Original cause of wound was Gradually Appeared. The wound is located on the Right,Medial Foot. The wound measures 3.2cm length x 3cm width x 0.3cm depth; 7.54cm^2 area and 2.262cm^3 volume. There is muscle, tendon, and Fat Layer (Subcutaneous Tissue) Exposed exposed. There is no tunneling or undermining noted. There is a medium amount of serosanguineous drainage noted. The wound margin is well defined and not attached to the wound base. There is large (67-100%) red, pink granulation within the  wound bed. There is a small (1-33%) amount of necrotic tissue within the wound bed including Adherent Slough and Necrosis of Muscle. Wound #47 status is Open. Original cause of wound was Gradually Appeared. The wound is located on the Right,Anterior Lower Leg. The wound measures 1cm length x 0.6cm width x 0.1cm depth; 0.471cm^2 area and 0.047cm^3 volume. There is Fat Layer (Subcutaneous Tissue) Exposed exposed. There is no tunneling or undermining noted. There is a medium amount of serosanguineous drainage noted. The wound margin is distinct with the outline attached to the wound base. There is large (67-100%) pink, hyper - granulation within the wound bed. There is a small (1-33%) amount of necrotic tissue within the wound bed including Adherent Slough. 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 Dressing Change Frequency: Wound #43 Right,Medial Foot: Change dressing three times week. Wound #47 Right,Anterior Lower Leg: Change dressing three times week. Wound Cleansing: Clean wound with Wound Cleanser - Anasept to all wounds Primary Wound Dressing: Wound #43 Right,Medial Foot: Calcium Alginate with Silver Wound #47 Right,Anterior Lower Leg: Calcium Alginate with Silver Secondary Dressing: Wound #43 Right,Medial Foot: Kerlix/Rolled [name] ABD pad Wound #47 Right,Anterior Lower Leg: Kerlix/Rolled [name] ABD pad Edema Control: Elevate legs to the level of the heart or above for 30 minutes daily and/or when sitting, a frequency of: - throughout the day Home Health: [facility] for wound care. - Interim 1. I am continue with silver alginate. 2. She is making remarkable progress 3. I will look into prophylactic antibiotic Electronic Signature(s) Signed: 03/24/2019 7:47:18 AM By: Linton Ham MD Entered By: Linton Ham on 03/23/2019 13:54:29 -------------------------------------------------------------------------------- SuperBill Details Patient Name: Date of Service: [name] 03/23/2019 Medical Record Number:[id number] Patient Account Number: [id number] Date of Birth/Sex: Treating RN: 03/31/1971 (48 y.o. [name] Primary Care Provider: [name] Other Clinician: Referring Provider: Treating Provider/Extender:, [name], [name] in Treatment: 28 Diagnosis Coding ICD-10 Codes Code Description [phone] Other chronic osteomyelitis, right ankle and foot L97.514 Non-pressure chronic ulcer of other part of right foot with necrosis of bone E10.621 Type 1 diabetes mellitus with foot ulcer L02.415 Cutaneous abscess of right lower limb L97.812 Non-pressure chronic ulcer of other part of right lower leg with fat layer exposed Facility Procedures CPT4 Code: [id number] Description: 99214 - WOUND  CARE VISIT-LEV 4 EST PT Modifier: Quantity: 1 Physician Procedures CPT4 Code Description: [id number] - WC PHYS LEVEL 3 - EST PT ICD-10 Diagnosis Description L97.514 Non-pressure chronic ulcer of other part of right foot with L02.415 Cutaneous abscess of right lower limb E10.621 Type 1 diabetes mellitus with foot  ulcer Modifier: necrosis of bone Quantity: 1 Electronic Signature(s) Signed: 03/24/2019 7:47:18 AM By: Linton Ham MD Entered By: Linton Ham on 03/23/2019 13:54:50

## 2019-03-26 NOTE — Progress Notes (Signed)
[name], [name] ([id number]) Visit Report for 03/23/2019 Arrival Information Details Patient Name: Date of Service: [name], [name] 03/23/2019 12:45 PM Medical Record Number:[id number] Patient Account Number: [id number] Date of Birth/Sex: Treating RN: [birth year] (48 y.o. [name] Primary Care Adelise Buswell: [name] Other Clinician: Referring Samiksha Pellicano: Treating Tailey Top/Extender:[name], [name], [name] in Treatment: 28 Visit Information History Since Last Visit All ordered tests and consults were completed: No Patient Arrived: Wheel Chair Added or deleted any medications: No Arrival Time: 13:05 Any new allergies or adverse reactions: No Accompanied By: self Had a fall or experienced change in No activities of daily living that may affect Transfer Assistance: None risk of falls: Patient Identification Verified: Yes Signs or symptoms of abuse/neglect since last No Secondary Verification Process Completed: Yes visito Patient Requires Transmission-Based No Hospitalized since last visit: No Precautions: Implantable device outside of the clinic excluding No Patient Has Alerts: No cellular tissue based products placed in the center since last visit: Has Dressing in Place as Prescribed: Yes Has Compression in Place as Prescribed: Yes Pain Present Now: No Electronic Signature(s) Signed: 03/25/2019 11:01:46 AM By: Carlene Coria RN Entered By: Carlene Coria on 03/23/2019 78:29:56 -------------------------------------------------------------------------------- Clinic Level of Care Assessment Details Patient Name: Date of Service: [name], [name] 03/23/2019 12:45 PM Medical Record Number:[id number] Patient Account Number: [id number] Date of Birth/Sex: Treating RN: 04/11/1970 (48 y.o. [name] Primary Care Vinetta Brach: [name] Other Clinician: Referring Emalie Mcwethy: Treating Sosie Gato/Extender:[name], [name], [name] in  Treatment: 28 Clinic Level of Care Assessment Items TOOL 4 Quantity Score _0  - Use when only an EandM is performed on FOLLOW-UP visit 0 ASSESSMENTS - Nursing Assessment / Reassessment X - Reassessment of Co-morbidities (includes updates in patient status) 1 10 X - Reassessment of Adherence to Treatment Plan 1 5 ASSESSMENTS - Wound and Skin Assessment / Reassessment _1  - Simple Wound Assessment / Reassessment - one wound 0 X - Complex Wound Assessment / Reassessment - multiple wounds 2 5 _2  - Dermatologic / Skin Assessment (not related to wound area) 0 ASSESSMENTS - Focused Assessment X - Circumferential Edema Measurements - multi extremities 1 5 _3  - Nutritional Assessment / Counseling / Intervention 0 X - Lower Extremity Assessment (monofilament, tuning fork, pulses) 1 5 _4  - Peripheral Arterial Disease Assessment (using hand held doppler) 0 ASSESSMENTS - Ostomy and/or Continence Assessment and Care _5  - Incontinence Assessment and Management 0 _6  - Ostomy Care Assessment and Management (repouching, etc.) 0 PROCESS - Coordination of Care X - Simple Patient / Family Education for ongoing care 1 15 _7  - Complex (extensive) Patient / Family Education for ongoing care 0 X - Staff obtains Programmer, systems, Records, Test Results / Process Orders 1 10 X - Staff telephones HHA, Nursing Homes / Clarify orders / etc 1 10 _8  - Routine Transfer to another Facility (non-emergent condition) 0 _9  - Routine Hospital Admission (non-emergent condition) 0 _10  - New Admissions / Biomedical engineer / Ordering NPWT, Apligraf, etc. 0 _11  - Emergency Hospital Admission (emergent condition) 0 X - Simple Discharge Coordination 1 10 _12  - Complex (extensive) Discharge Coordination 0 PROCESS - Special Needs _13  - Pediatric / Minor Patient Management 0 _14  - Isolation Patient Management 0 _15  - Hearing / Language / Visual special needs 0 _16  - Assessment of Community assistance (transportation, D/C planning, etc.)  0 _17  - Additional assistance / Altered mentation 0 _18  - Support Surface(s) Assessment (bed, cushion, seat, etc.) 0 INTERVENTIONS - Wound Cleansing / Measurement _19  - Simple Wound Cleansing -  one wound 0 X - Complex Wound Cleansing - multiple wounds 2 5 X - Wound Imaging (photographs - any number of wounds) 1 5 _0  - Wound Tracing (instead of photographs) 0 _1  - Simple Wound Measurement - one wound 0 X - Complex Wound Measurement - multiple wounds 2 5 INTERVENTIONS - Wound Dressings X - Small Wound Dressing one or multiple wounds 2 10 _2  - Medium Wound Dressing one or multiple wounds 0 _3  - Large Wound Dressing one or multiple wounds 0 X - Application of Medications - topical 1 5 <[id number]>_1<\/[id number]>_4  - Application of Medications - injection 0 INTERVENTIONS - Miscellaneous _5  - External ear exam 0 _6  - Specimen Collection (cultures, biopsies, blood, body fluids, etc.) 0 _7  - Specimen(s) / Culture(s) sent or taken to Lab for analysis 0 _8  - Patient Transfer (multiple staff / Civil Service fast streamer / Similar devices) 0 _9  - Simple Staple / Suture removal (25 or less) 0 _10  - Complex Staple / Suture removal (26 or more) 0 _11  - Hypo / Hyperglycemic Management (close monitor of Blood Glucose) 0 _12  - Ankle / Brachial Index (ABI) - do not check if billed separately 0 X - Vital Signs 1 5 Has the patient been seen at the hospital within the last three years: Yes Total Score: 135 Level Of Care: New/Established - Level 4 Electronic Signature(s) Signed: 03/23/2019 5:05:42 PM By: Baruch Gouty RN, BSN Entered By: Baruch Gouty on 03/23/2019 13:34:55 -------------------------------------------------------------------------------- Encounter Discharge Information Details Patient Name: Date of Service: [name] 03/23/2019 12:45 PM Medical Record Number:[id number] Patient Account Number: [id number] Date of Birth/Sex: Treating RN: Apr 14, 1970 (48 y.o. [name] Primary Care Tysin Salada: [name] Other  Clinician: Referring Jaylun Fleener: Treating Sandie Swayze/Extender:[name], [name], [name] in Treatment: 42 Encounter Discharge Information Items Discharge Condition: Stable Ambulatory Status: Wheelchair Discharge Destination: Home Transportation: Other Accompanied By: self Schedule Follow-up Appointment: Yes Clinical Summary of Care: Patient Declined Electronic Signature(s) Signed: 03/25/2019 4:54:21 PM By: [name] Entered By: [name] on 03/23/2019 13:52:36 -------------------------------------------------------------------------------- Lower Extremity Assessment Details Patient Name: Date of Service: [name], [name] 03/23/2019 12:45 PM Medical Record Number:[id number] Patient Account Number: [id number] Date of Birth/Sex: Treating RN: April 09, 1970 (48 y.o. [name] Primary Care Ryleah Miramontes: [name] Other Clinician: Referring Conny Situ: Treating Lavon Horn/Extender:[name], [name], [name] in Treatment: 28 Edema Assessment Assessed: [Left: No] [Right: No] Edema: [Left: Ye] [Right: s] Calf Left: Right: Point of Measurement: cm From Medial Instep cm 29 cm Ankle Left: Right: Point of Measurement: cm From Medial Instep cm 20 cm Electronic Signature(s) Signed: 03/25/2019 11:01:46 AM By: Carlene Coria RN Entered By: Carlene Coria on 03/23/2019 13:14:54 -------------------------------------------------------------------------------- Multi Wound Chart Details Patient Name: Date of Service: [name] 03/23/2019 12:45 PM Medical Record Number:[id number] Patient Account Number: [id number] Date of Birth/Sex: Treating RN: [birth year] (48 y.o. 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] [N/A:N/A] Comorbid History: [43:Cataracts, Chronic sinus Cataracts, Chronic sinus N/A problems/congestion, Anemia, Sleep Apnea, Deep Anemia, Sleep Apnea, Deep Vein Thrombosis, Hypertension, Peripheral Hypertension, Peripheral Arterial Disease, Type I Diabetes,  Osteoarthritis, Osteomyelitis, Neuropathy, Osteomyelitis, Neuropathy, Seizure Disorder] [47:problems/congestion, Vein Thrombosis, Arterial Disease, Type I Diabetes,  Osteoarthritis, Seizure Disorder] Date Acquired: [43:08/04/2018] [47:01/31/2019] [N/A:N/A] Weeks of Treatment: [43:28] [47:7] [N/A:N/A] Wound Status: [43:Open] [47:Open] [N/A:N/A] Clustered Wound: [43:Yes] [47:No] [N/A:N/A] Clustered Quantity: [43:1] [47:N/A] [N/A:N/A] Measurements L x W x D 3.2x3x0.3 [47:1x0.6x0.1] [N/A:N/A] (cm) Area (cm) : [43:7.54] [13:0.865] [N/A:N/A] Volume (cm) : [43:2.262] [78:4.696] [N/A:N/A] % Reduction in Area: [43:89.60%] [47:-7.00%] [N/A:N/A] % Reduction in Volume: 89.60% [47:-6.80%] [N/A:N/A] Classification: [43:Grade 4] [47:Full Thickness Without Exposed Support Structures] [N/A:N/A] Exudate Amount: [43:Medium] [47:Medium] [N/A:N/A] Exudate Type: [43:Serosanguineous] [47:Serosanguineous] [N/A:N/A] Exudate Color: [43:red, brown] [47:red, brown] [N/A:N/A] Wound Margin: [43:Well defined, not attached Distinct, outline attached N/A] Granulation Amount: [43:Large (67-100%)] [47:Large (67-100%)] [N/A:N/A] Granulation Quality: [43:Red, Pink] [47:Pink, Hyper-granulation] [N/A:N/A] Necrotic Amount: [43:Small (1-33%)] [47:Small (1-33%)] Exposed Structures: [43:Fat Layer (Subcutaneous Tissue) Exposed: Yes Tendon: Yes  Muscle: Yes Fascia: No Joint: No Bone: No Small (1-33%)] [47:Fat Layer (Subcutaneous Tissue) Exposed: Yes Fascia: No Tendon: No Muscle: No Joint: No Bone: No None] Treatment Notes Electronic Signature(s) Signed: 03/24/2019 7:47:18 AM By: Linton Ham MD Entered By: Linton Ham on 03/23/2019 13:48:59 -------------------------------------------------------------------------------- Multi-Disciplinary Care Plan Details Patient Name: Date of Service: [name] 03/23/2019 12:45 PM Medical Record Number:[id number] Patient Account Number: [id number] Date of Birth/Sex: Treating RN: [birth year] (48 y.o. [name] Primary Care Kenyette Gundy: [name] Other Clinician: Referring Harolyn Cocker: Treating Keelan Tripodi/Extender:[name], [name], [name] in Treatment: 32 Active Inactive Nutrition Nursing Diagnoses: Imbalanced nutrition Impaired glucose control: actual or potential Potential for alteratiion in Nutrition/Potential for imbalanced nutrition Goals: Patient/caregiver agrees to and verbalizes understanding of need to use nutritional supplements and/or vitamins as prescribed Date Initiated: 09/03/2018 Date Inactivated: 10/07/2018 Target Resolution Date: 10/01/2018 Goal Status: Met Patient/caregiver will maintain therapeutic glucose control Date Initiated: 09/03/2018 Target Resolution Date: 04/20/2019 Goal Status: Active Interventions: Provide education on elevated blood sugars and impact on wound healing Treatment Activities: Patient referred to Primary Care Physician for further nutritional evaluation : 09/03/2018 Notes: Wound/Skin Impairment Nursing Diagnoses: Impaired tissue integrity Knowledge deficit related to ulceration/compromised skin integrity Goals: Patient/caregiver will verbalize understanding of skin care regimen Date Initiated: 09/03/2018 Target Resolution Date: 04/03/2019 Goal Status: Active Ulcer/skin breakdown will have a volume reduction of 30% by  week 4 Date Initiated: 09/03/2018 Date Inactivated: 10/07/2018 Target Resolution Date: 10/01/2018 Unmet Goal Status: Unmet Reason: COMORBITIES Ulcer/skin breakdown will have a volume reduction of 50% by week 8 Date Initiated: 10/07/2018 Date Inactivated: 11/03/2018 Target Resolution Date: 10/31/2018 Unmet Goal Status: Unmet Reason: Osteomyelitis Interventions: Assess patient/caregiver ability to obtain necessary supplies Assess patient/caregiver ability to perform ulcer/skin care regimen upon admission and as needed Assess ulceration(s) every visit Provide education on ulcer and skin care Treatment Activities: Skin care regimen initiated : 09/03/2018 Topical wound management initiated : 09/03/2018 Notes: Electronic Signature(s) Signed: 03/23/2019 5:05:42 PM By: Baruch Gouty RN, BSN Entered By: Baruch Gouty on 03/23/2019 13:03:11 -------------------------------------------------------------------------------- Pain Assessment Details Patient Name: Date of Service: [name] 03/23/2019 12:45 PM Medical Record Number:[id number] Patient Account Number: [id number] Date of Birth/Sex: Treating RN: May 27, 1970 (48 y.o. [name] Primary Care Lawsyn Heiler: [name] Other Clinician: Referring Elizer Bostic: Treating Enza Shone/Extender:[name], [name], [name] in Treatment: 28 Active Problems Location of Pain Severity and Description of Pain Patient Has Paino No Site Locations Pain Management and Medication Current Pain Management: Electronic Signature(s) Signed: 03/25/2019 11:01:46 AM By: Carlene Coria RN Entered By: Carlene Coria on 03/23/2019 13:08:26 -------------------------------------------------------------------------------- Patient/Caregiver Education Details Margorie John 12/21/2020andnbsp12:45 Patient Name: Date of Service: L. PM Medical Record Patient Account Number: [id number] Number: Treating RN: Baruch Gouty Date of Birth/Gender:  Aug 08, 1970 (48 y.o. F) Other Clinician: Primary Care Physician:MOREIRA, [name] Treating Linton Ham Referring Physician: Physician/Extender: Juanito Doom in Treatment: 88 Education Assessment Education Provided To: Patient Education Topics Provided Elevated Blood Sugar/ Impact on Healing: Methods: Explain/Verbal Responses: Reinforcements needed, State content correctly Wound/Skin Impairment: Methods: Explain/Verbal Responses: Reinforcements needed, State content correctly Electronic Signature(s) Signed: 03/23/2019 5:05:42 PM By: Baruch Gouty RN, BSN Entered By: Baruch Gouty on 03/23/2019 13:03:39 -------------------------------------------------------------------------------- Wound Assessment Details Patient Name: Date of Service: [name] 03/23/2019 12:45 PM Medical Record Number:[id number] Patient Account Number: [id number] Date of Birth/Sex: Treating RN: [birth year] (48 y.o. [name] Primary Care Irini Leet: [name] Other Clinician: Referring Chancy Claros: Treating Umair Rosiles/Extender:[name], [name], [name] in Treatment: 28 Wound Status Wound Number: 43 Primary Diabetic Wound/Ulcer of the Lower Extremity Etiology: Wound Location: Right Foot - Medial Wound Open Wounding Event: Gradually Appeared Status: Date Acquired: 08/04/2018 Comorbid Cataracts, Chronic sinus problems/congestion, Weeks Of Treatment: 28 History: Anemia, Sleep Apnea, Deep Vein Thrombosis, Clustered Wound: Yes Hypertension, Peripheral Arterial Disease, Type I Diabetes, Osteoarthritis, Osteomyelitis, Neuropathy, Seizure Disorder Photos Wound Measurements Length: (cm) 3.2 % Reduction in Width: (cm) 3 % Reduction in Depth: (cm) 0.3 Epithelializati Clustered Quantity: 1 Tunneling: Area: (cm) 7.54 Undermining: Volume: (cm) 2.262 Wound Description Classification: Grade 4 Foul Odor Afte Wound Margin: Well defined, not attached Slough/Fibrino Exudate  Amount: Medium Exudate Type: Serosanguineous Exudate Color: red, brown Wound Bed Granulation Amount: Large (67-100%) Granulation Quality: Red, Pink Fascia Exposed Necrotic Amount: Small (1-33%) Fat Layer (Sub Necrotic Quality: Adherent Slough Tendon Exposed Muscle Exposed Necrosis o Joint Exp Bone Expo r Cleansing: No Yes Exposed Structure : No cutaneous Tissue) Exposed: Yes : Yes : Yes f Muscle: Yes osed: No sed: No Area: 89.6% Volume: 89.6% on: Small (1-33%) No No Treatment Notes Wound #43 (Right, Medial Foot) 1. Cleanse With Wound Cleanser 3. Primary Dressing Applied Calcium Alginate Ag 4. Secondary Dressing ABD Pad Dry [name] Roll [name] 5. Secured With Recruitment consultant) Signed: 03/25/2019 3:48:24 PM By: [name] EMT/HBOT Signed: 03/26/2019 11:24:03 AM By: Carlene Coria RN Previous Signature: 03/25/2019 11:01:46 AM Version By: Carlene Coria RN Entered By: [name] on 03/25/2019 11:40:04 -------------------------------------------------------------------------------- Wound Assessment Details Patient Name: Date of Service: [name] 03/23/2019 12:45 PM Medical Record Number:[id number] Patient Account Number: [id number] Date of Birth/Sex: Treating RN: February 06, 1971 (48 y.o. [name] Primary Care Neidy Guerrieri: [name] Other Clinician: Referring Ziah Leandro: Treating Kiira Brach/Extender:[name], [name], [name] in Treatment: 28 Wound Status Wound Number: 47 Primary Abscess Etiology: Wound Location: Right Lower Leg - Anterior Wound Open Wounding Event: Gradually Appeared Status: Date Acquired: 01/31/2019 Comorbid Cataracts, Chronic sinus problems/congestion, Weeks Of Treatment: 7 History: Anemia, Sleep Apnea, Deep Vein Thrombosis, Clustered Wound: No Hypertension, Peripheral Arterial Disease, Type I Diabetes, Osteoarthritis, Osteomyelitis, Neuropathy, Seizure Disorder Photos Wound Measurements Length:  (cm) 1 % Reduct Width: (cm) 0.6 % Reduct Depth: (cm) 0.1 Epitheli Area: (cm) 0.471 Tunneli Volume: (cm) 0.047 Undermi Wound Description Full Thickness Without Exposed Support Classification: Structures Wound Distinct, outline attached Margin: Exudate Medium Amount: Exudate Serosanguineous Type: Exudate red, brown Color: Wound Bed Granulation Amount: Large (67-100%) Granulation Quality: Pink, Hyper-granulation Necrotic Amount: Small (1-33%) Necrotic Quality: Adherent Slough Foul Odor After Cleansing: No Slough/Fibrino Yes Exposed Structure Fascia Exposed: No Fat Layer (Subcutaneous Tissue) Exposed: Yes Tendon Exposed: No Muscle Exposed: No Joint Exposed: No Bone Exposed: No ion in Area: -7% ion in Volume: -6.8% alization: None ng: No ning: No Treatment Notes Wound #47 (Right, Anterior Lower Leg) 1.  Cleanse With Wound Cleanser 3. Primary Dressing Applied Calcium Alginate Ag 4. Secondary Dressing ABD Pad Dry [name] Roll [name] 5. Secured With Recruitment consultant) Signed: 03/25/2019 3:48:24 PM By: [name] EMT/HBOT Signed: 03/26/2019 11:24:03 AM By: Carlene Coria RN Previous Signature: 03/25/2019 11:01:46 AM Version By: Carlene Coria RN Entered By: [name] on 03/25/2019 11:39:34 -------------------------------------------------------------------------------- Vitals Details Patient Name: Date of Service: [name] 03/23/2019 12:45 PM Medical Record Number:[id number] Patient Account Number: [id number] Date of Birth/Sex: Treating RN: 1970/09/23 (48 y.o. [name] Primary Care Quincey Nored: [name] Other Clinician: Referring Meoshia Billing: Treating Dewayne Severe/Extender:[name], [name], [name] in Treatment: 28 Vital Signs Time Taken: 13:06 Temperature (F): 98.6 Height (in): 67 Pulse (bpm): 67 Weight (lbs): 125 Respiratory Rate (breaths/min): 18 Body Mass Index (BMI): 19.6 Blood Pressure (mmHg):  169/78 Reference Range: 80 - 120 mg / dl Electronic Signature(s) Signed: 03/25/2019 11:01:46 AM By: Carlene Coria RN Entered By: Carlene Coria on 03/23/2019 13:08:18

## 2019-03-31 ENCOUNTER — Encounter: Payer: Medicare HMO | Admitting: Nutrition

## 2019-04-01 ENCOUNTER — Ambulatory Visit: Payer: Medicare HMO | Admitting: Endocrinology

## 2019-04-02 ENCOUNTER — Ambulatory Visit: Payer: Medicare HMO | Admitting: Endocrinology

## 2019-04-06 ENCOUNTER — Encounter (HOSPITAL_BASED_OUTPATIENT_CLINIC_OR_DEPARTMENT_OTHER): Payer: Medicare HMO | Attending: Internal Medicine | Admitting: Internal Medicine

## 2019-04-06 ENCOUNTER — Other Ambulatory Visit: Payer: Self-pay

## 2019-04-06 ENCOUNTER — Encounter: Payer: Medicare HMO | Attending: Endocrinology | Admitting: Nutrition

## 2019-04-06 DIAGNOSIS — Z992 Dependence on renal dialysis: Secondary | ICD-10-CM | POA: Diagnosis not present

## 2019-04-06 DIAGNOSIS — Z9641 Presence of insulin pump (external) (internal): Secondary | ICD-10-CM | POA: Insufficient documentation

## 2019-04-06 DIAGNOSIS — Z95820 Peripheral vascular angioplasty status with implants and grafts: Secondary | ICD-10-CM | POA: Diagnosis not present

## 2019-04-06 DIAGNOSIS — E1042 Type 1 diabetes mellitus with diabetic polyneuropathy: Secondary | ICD-10-CM | POA: Insufficient documentation

## 2019-04-06 DIAGNOSIS — M86671 Other chronic osteomyelitis, right ankle and foot: Secondary | ICD-10-CM | POA: Insufficient documentation

## 2019-04-06 DIAGNOSIS — Z89422 Acquired absence of other left toe(s): Secondary | ICD-10-CM | POA: Insufficient documentation

## 2019-04-06 DIAGNOSIS — L97514 Non-pressure chronic ulcer of other part of right foot with necrosis of bone: Secondary | ICD-10-CM | POA: Diagnosis not present

## 2019-04-06 DIAGNOSIS — Z86718 Personal history of other venous thrombosis and embolism: Secondary | ICD-10-CM | POA: Insufficient documentation

## 2019-04-06 DIAGNOSIS — N183 Chronic kidney disease, stage 3 unspecified: Secondary | ICD-10-CM | POA: Diagnosis not present

## 2019-04-06 DIAGNOSIS — E1022 Type 1 diabetes mellitus with diabetic chronic kidney disease: Secondary | ICD-10-CM | POA: Insufficient documentation

## 2019-04-06 DIAGNOSIS — L97812 Non-pressure chronic ulcer of other part of right lower leg with fat layer exposed: Secondary | ICD-10-CM | POA: Diagnosis not present

## 2019-04-06 DIAGNOSIS — Z89431 Acquired absence of right foot: Secondary | ICD-10-CM | POA: Insufficient documentation

## 2019-04-06 DIAGNOSIS — Z87891 Personal history of nicotine dependence: Secondary | ICD-10-CM | POA: Insufficient documentation

## 2019-04-06 DIAGNOSIS — Z794 Long term (current) use of insulin: Secondary | ICD-10-CM | POA: Insufficient documentation

## 2019-04-06 DIAGNOSIS — E1069 Type 1 diabetes mellitus with other specified complication: Secondary | ICD-10-CM | POA: Diagnosis not present

## 2019-04-06 DIAGNOSIS — Z8614 Personal history of Methicillin resistant Staphylococcus aureus infection: Secondary | ICD-10-CM | POA: Insufficient documentation

## 2019-04-06 DIAGNOSIS — E10621 Type 1 diabetes mellitus with foot ulcer: Secondary | ICD-10-CM | POA: Insufficient documentation

## 2019-04-06 DIAGNOSIS — E1051 Type 1 diabetes mellitus with diabetic peripheral angiopathy without gangrene: Secondary | ICD-10-CM | POA: Insufficient documentation

## 2019-04-06 NOTE — Progress Notes (Signed)
Nancy, [name] ([id number]) Visit Report for 04/06/2019 Debridement Details Patient Name: Date of Service: ADRIE, PICKING 04/06/2019 10:00 AM Medical Record Number:[id number] Patient Account Number: [id number] Date of Birth/Sex: Treating RN: 04/15/1970 (49 y.o. F) Primary Care Provider: [name] Other Clinician: Referring Provider: Treating Provider/Extender:Eudell Mcphee, [name], [name] in Treatment: 30 Debridement Performed for Wound #43 Right,Medial Foot Assessment: Performed By: Physician Ricard Dillon., MD Debridement Type: Debridement Severity of Tissue Pre Fat layer exposed Debridement: Level of Consciousness (Pre- Awake and Alert procedure): Pre-procedure Verification/Time Out Taken: Yes - 11:26 Start Time: 11:26 Total Area Debrided (L x W): 1 (cm) x 1 (cm) = 1 (cm) Tissue and other material Non-Viable, Slough, Subcutaneous, Slough debrided: Level: Skin/Subcutaneous Tissue Debridement Description: Excisional Instrument: Blade, Forceps Bleeding: Minimum Hemostasis Achieved: Pressure End Time: 11:27 Procedural Pain: 0 Post Procedural Pain: 0 Response to Treatment: Procedure was tolerated well Level of Consciousness Awake and Alert (Post-procedure): Post Debridement Measurements of Total Wound Length: (cm) 4.5 Width: (cm) 3 Depth: (cm) 0.2 Volume: (cm) 2.121 Character of Wound/Ulcer Post Improved Debridement: Severity of Tissue Post Debridement: Fat layer exposed Post Procedure Diagnosis Same as Pre-procedure Electronic Signature(s) Signed: 04/06/2019 4:56:38 PM By: Linton Ham MD Entered By: Linton Ham on 04/06/2019 11:45:08 -------------------------------------------------------------------------------- HPI Details Patient Name: Date of Service: [name] 04/06/2019 10:00 AM Medical Record Number:[id number] Patient Account Number: [id number] Date of Birth/Sex: Treating RN: 08/20/1970 (49 y.o. F) Primary Care  Provider: [name] Other Clinician: Referring Provider: Treating Provider/Extender:Kathlee Barnhardt, [name], [name] in Treatment: 30 History of Present Illness HPI Description: 49 year old diabetic who is known to have type 1 diabetes which is poorly controlled last hemoglobin A1c was 11%. She comes in with a ulcerated area on the left lateral foot which has been there for over 6 months. Was recently she has been treated by [name] of podiatry who saw her last on 05/28/2016. Review of his notes revealed that the patient had incision and drainage with placement of antibiotic beads to the left foot on 04/11/2016 for possible osteomyelitis of the cuboid bone. Over the last year she's had a history of amputation of the left fifth toe and a femoropopliteal popliteal bypass graft somewhere in April 2017. 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 His note [name] mentions that the patient has been referred to me for further wound care and possibly great candidate for hyperbaric oxygen therapy due to recurrent osteomyelitis. However we do not have any x-rays of biopsy reports confirming this. He has been on several antibiotics including Bactrim and most recently is on doxycycline for an MRSA. I understand, the patient was not a candidate for IV antibiotics as she has had previous PICC lines which resulted in blood clots in both arms. There was a x-ray report dated 04/04/2016 on [name] notes which showed evidence of fifth ray resection left foot with osteolytic changes noted to the fourth metatarsal and cuboid bone on the left. 06/13/2016 -- had a left foot x-ray which showed no acute fracture or dislocation and no definite radiographic evidence of osteomyelitis. Advanced osteopenia was seen. 06/20/2016 -- she has noticed a new wound on the right plantar foot in the region where she had a callus before. 06/27/16- the patient did have her x-ray of the right foot which  showed no findings to suggest osteomyelitis. She saw her endocrinologist, [name], yesterday. Her A1c in January was 11. He also indicates mismanagement and noncompliance regarding her diabetes. She is currently on Bactrim  for a lip infection. She is complaining of nausea, vomiting and diarrhea. She is unable to articulate the exact orders or dosing of the Bactrim; it is unclear when she will complete this. 07/04/2016 -- results from [facility] of ABIs with ankle waveforms were noted from 02/14/2016. The examination done on 06/27/2015 showed noncompressible ABIs with the right being 1.45 and the left being 1.33. The present examination showed a right ABI of 1.19 on the left of 1.33. The conclusion was that right normal ABI in the lower extremity at rest however compared to previous study which was noncompressible ABI may be falsely elevated side suggesting medial calcification. The left ABI suggested medial calcification. 08/01/2016 -- the patient had more redness and pain on her right foot and did not get to come to see as noted she see her PCP or go to the ER and decided to take some leftover metronidazole which she had at home. As usual, the patient does report she feels and is rather noncompliant. 08/08/2016 -- -- x-ray of the right foot -- FINDINGS:Transmetatarsal amputation is noted. No bony destruction is noted to suggest osteomyelitis. IMPRESSION: No evidence of osteomyelitis. Postsurgical changes are seen. MRI would be more sensitive for possible bony changes. Culture has grown Serratia Marcescens -- sensitive to Bactrim, ciprofloxacin, ceftazidime she was seen by [name] on 08/06/2016. He did not find any exposed bone, muscle, tendon, ligament or joint. There was no malodor and he did a excisional debridement in the office. ============ Old notes: 49 year old patient who is known to the wound clinic for a while had been away from the wound clinic since 09/01/2014. Over  the last several months she has been admitted to various hospitals including [facility]. She was treated for a right metatarsal osteomyelitis with a transmetatarsal amputation and this was done about 2 months ago. He has a small ulcerated area on the right heel and she continues to have an ulcerated area on the left plantar aspect of the foot. The patient was recently admitted to the [facility] hospital group between 7/12 and 10/18/2014. she was given 3 weeks of IV vancomycin and was to follow-up with her surgeons at [facility] and also took oral vancomycin for C. difficile colitis. 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 MRI of the left foot done on 10/14/2014 showed no abscess or osteomyelitis. 04/27/15; this is a patient we know from previous stays in the wound care center. She is a type I diabetic I am not sure of her control currently. Since the last time I saw her she is had a right transmetatarsal amputation and has no wounds on her right foot and has no open wounds. She is been followed at the wound care center at [facility] in [place]. She comes today with the desire to undergo hyperbaric treatment locally. Apparently one of her wound care providers in [place] has suggested hyperbarics. This is in response to an MRI from 04/18/15 that showed increased marrow signal and loss of the proximal fifth metatarsal cortex evidence of osteomyelitis with likely early osteomyelitis in the cuboid bone as well. She has a large wound over the base of the fifth metatarsal. She also has a eschar over her the tips of her toes on 1,3 and 5. She does  not have peripheral pulses and apparently is going for an  angiogram tomorrow which seems reasonable. After this she is going to infectious disease at [facility]. They have been using Medihoney to the large wound on the lateral aspect of the left foot to. The patient has known Charcot deformity from diabetic neuropathy. She also has known diabetic PAD. Surprisingly I can't see that she has had any recent antibiotics, the patient states the last antibiotic she had was at the end of November for 10 days. I think this was in response to culture that showed group G strep although I'm not exactly sure where the culture was from. She is also had arterial studies on 03/29/15. This showed a right ABI of 1.4 that was noncompressible. Her left ABI was 0.73. 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 It was not felt that arterial inflow was adequate for healing of a foot ulcer. Her Doppler waveforms looked monophasic ===== READMISSION 02/28/17; this is in an now 49 year old woman we've had at several different occasions in this clinic. 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 She has a remote ex-smoker. She was last seen in this clinic by Dr. Con Memos I think in May. More recently she is been followed by her podiatrist [name] an infectious disease Dr. Megan Salon. She has 2 open wounds the major one is over the right first metatarsal head she also has a wound on the left plantar foot. an MRI of the right foot on 01/01/17 showed a soft tissue ulcer along the plantar aspect of the first metatarsal base consistent with osteomyelitis of the first metatarsal stump. Dr. 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 According to the patient this is been open for slightly over a month. She has been on a combination of Cipro 500 twice a day, Zyvox 600 twice a day and Flagyl 500 3 times a day for over a month now as directed by Dr. Megan Salon.  cultures of the right foot earlier this year showed MRSA in January and Serratia in May. January also had a few viridans strep. Recent x-rays of both feet were done and [name] office and I don't have these reports. The patient has known PAD and has a history of aleft femoropopliteal bypass in April 2017. 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 Dwyane Dee. In spite of this the last hemoglobin A1c I can see is 10.1 on 01/01/2017. She is being referred by 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 She is been using Medihoney to this area and also an area on the left midfoot. She is using healing sandals bilaterally. ABIs in this clinic at the left posterior tibial was 1.1 noncompressible on the right READMISSION Non invasive vascular NOVANT 5/18 Aftercare following surgery of the circulatory system Procedure Note - Interface, External Ris In - 08/13/2016 11:05 AM EDT Procedure: Examination consists of physiologic resting arterial pressures of the brachial and ankle arteries bilaterally with continuous wave Doppler waveform analysis. Previous: Previous exam performed on 02/14/16 demonstrated ABIs of Rt = 1.19 and Lt = 1.33. Right: ABI = non-compressible PT, 1.47 DP. S/P transmet amputation. Left: ABI = 1.52, 2nd digit pressure =  87 mmHg Conclusions: Right: ABI (>1.3) may be falsely elevated, suggesting medial calcification. Left: ABI (>1.3) may be falsely elevated, suggesting medial calcification The patient is a now 49 year old type I diabetic is had multiple issues her graded to chronic diabetic foot ulcers. 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 We had her in the  clinic lastin November. At that point she had wounds on her bilateral feet.she had wanted to try hyperbarics however the healogics review process denied her because she hadn't followed up with her vascular surgeon for her left femoropopliteal bypass. The bypass was done by Dr. Raul Del at [facility]. We made her a follow-up with Dr. Raul Del however she did not keep the appointment and therefore she was not approved The patient shows me a small wound on her left fourth metatarsal head on her phone. She developed rapid discoloration in the plantar aspect of the left foot and she was admitted to hospital from 2/2 through 05/10/17 with wet gangrene of the left foot osteomyelitis of the fourth metatarsal heads. She was admitted acutely ill with a temperature of 103. She was started on broad-spectrum vancomycin and cefepime. On 05/06/17 she was taken to the OR by [name] her podiatric surgeon for an incision and drainage irrigation of the left foot wound. Cultures from this surgery revealed group be strep and anaerobes. she was seen by Dr.Xu of orthopedic surgery and scheduled for a below-knee amputation which she u refused. Ultimately she was discharged on Levaquin and Flagyl for one month. MRI 05/05/17 done while she was in the hospital showed abscess adjacent to the fourth metatarsal head and neck small abscess around the fourth flexor tendon. Inflammatory phlegmon and gas in the soft tissues along the lateral aspect of the fourth phalanx. Findings worrisome for osteomyelitis involving the fourth proximal and middle phalanx and also the third and fourth metatarsals. Finally the patient had actually shortly before this followed up with Dr. Raul Del at no time on 04/29/17. He felt that her left femoropopliteal bypass was patent he felt that her left-sided toe pressures more than adequate for healing a wound on the left foot. This was before her acute presentation. Her noninvasive diabetes are listed  above. 05/28/17; she is started hyperbarics. The patient tells me that for some reason she was not actually on Levaquin but I think on ciprofloxacin. She was on Flagyl. She only started her Levaquin yesterday due to some difficulty with the pharmacy and perhaps her sister picking it up. She has an appointment with [name] tomorrow and with infectious disease early next week. She has no new complaints 06/06/17; the patient continues in hyperbarics. She saw [name] on 05/29/17 who is her podiatric surgeon. He is elected for a transmetatarsal amputation on 06/27/17. I'm not sure at what level he plans to do this amputation. The patient is unaware She also saw Dr. Megan Salon of infectious disease who elected to continue her on current antibiotics I think this is ciprofloxacin and Flagyl. I'll need to clarify with her tomorrow if she actually has this. We're using silver alginate to the actual wound. Necrotic surface today with material under the flap of her foot. Original MRI showed abscesses as well as osteomyelitis of the proximal and middle fourth phalanx and the third and fourth metatarsal heads 06/11/17; patient continues in hyperbarics and continues on oral antibiotics. She is doing well. The wound looks better. The necrotic part of this under the flap in her  superior foot also looks better. she is been to see [name]. I haven't had a chance to look at his note. Apparently he has put the transmetatarsal amputation on hold her request it is still planning to take her to the OR for debridement and product application ACEL. I'll see if I can find his note. I'll therefore leave product ordering/requests to [name] for now. I was going to look at Dermagraft 06/18/17-she is here in follow-up evaluation for bilateral foot wounds. She continues with hyperbaric therapy. She states she has been applying manuka honey to the right plantar foot and alternate manuka honey and silver alginate to the left foot,  despite our orders. We will continue with same treatment plan and she will follo up next week. 06/25/17; I have reviewed [name] last note from 3/11. She has operative debridement in 2 days' time. By review his note apparently they're going to place there is skin over the majority of this wound which is a good choice. She has a small satellite area at the most proximal part of this wound on the left plantar foot. The area on the right plantar foot we've been using silver alginate and it is close to healing. 07/02/17; unfortunately the patient was not easily approved for [name] proposed surgery. I'm not completely certain what the issue is. She has been using silver alginate to the wound she has completed a first course of hyperbarics. She is still on Levaquin and Flagyl. I have really lost track of the time course here.I suspect she should have another week to 2 of antibiotics. I'll need to see if she is followed up with infectious disease Dr. Megan Salon 07/09/17; the patient is followed up with Dr. Megan Salon. 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 She continues on Levaquin and metronidazole continuing both of these for now I think she is been on fr about 6 weeks. She still has some drainage but no pain. No fever. Her had been plans for her to go to the OR for operative debridement with her podiatrist [name], I am not exactly sure where that is. I'll probably slip a note to [name] today. I note that she follows with Dr. Dwyane Dee of endocrinology. We have her recertified for hyperbaric oxygen. I have not heard about Dermagraft however I'll see if [name] is planning a skin substitute as well 07/16/17; the patient tells me she is just about out of Lluveras. I'll need to check Dr. Hale Bogus last notes on this. She states she has plenty of Flagyl however. She comes in today complaining of pain in the right lateral foot which she said lasted for about a  day. The wound on the right foot is actually much more medially. She also tells me that the [facility] cost a lot of pain in the left foot wound and she turned back to silver alginate. Finally Dermagraft has a $938 per application co-pay. She cannot afford this 07/23/17; patient arrives today with the wound not much smaller. There is not much new to add. She has not heard from [name] all try to put in a call to them today. She was asking about Dermagraft again and she has an over $101 per application co-pay she states that she would be willing to try to do a payment plan. I been tried to avoid this. We've been using silver alginate, I'll change to [facility] 07/30/17-She is here in follow-up  evaluation for left foot ulcer. She continues hyperbaric medicine. The left foot ulcer is stable we will continue with same treatment plan 08/06/17; she is here for evaluation of her left foot ulcer. Currently being treated for hyperbarics or underlying osteomyelitis. She is completed antibiotics. The left foot ulcer is better smaller with healthier looking granulation. For various reasons I am not really clear on we never got her back to the OR with [name]. He did not respond to my secure text message. Nevertheless I think that surgery on this point is not necessary nor am I completely clear that a skin substitute is necessary The patient is complaining about pain on the outside of her right foot. 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 There is no erythema. She also states the foot is warm versus her other part of her upper leg and this is largely true. It is not totally clear to me what's causing this. She thinks it's different from her usual neuropathy pain 08/13/17; she arrives in clinic today with a small wound which is superficial on her right first metatarsal head. 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 She tells [place] she was up on her feet over the Mother's  Day celebration. The large wound is on the left foot. Continues with hyperbarics for underlying osteomyelitis. We're using Hydrofera Blue. She asked me today about where we were with Dermagraft. I had actually excluded this because of the co- pay however she wants to assume this therefore I'll recheck the co-pay an order for next week. 08/20/17; the patient agreed to accept the co-pay of the first Apligraf which we applied today. She is disappointed she is finishing hyperbarics will run this through the insurance on the extent of the foot infection and the extent of the wound that she had however she is already had 60 dive's. Dermagraft No. 1 08/27/17; Dermagraft No. 2. She is not eligible for any more hyperbaric treatments this month. She reports a fair amount of drainage and she actually changed to the external dressings without disturbing the direct contact layer 09/03/17; the patient arrived in clinic today with the wound superficially looking quite healthy. Nice vibrant red tissue with some advancing epithelialization although not as much adherence of the flap as I might like. However she noted on her own fourth toe some bogginess and she brought that to our attention. Indeed this was boggy feeling like a possibility of subcutaneous fluid. 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 She is not been unwell. We've been using Dermagraft 09/10/17; the culture that I did not last week was MRSA. She saw Dr. Megan Salon this morning who is going to start her on vancomycin. I had sent him a secure a text message yesterday. I also spoke with her podiatric surgeon [name] about surgery on this foot the options for conserving a functional foot etc. Promised me he would see her and will make back consultation today. Paradoxically her actual wound on the plantar aspect of her left foot looks really quite good. I had given her 5 days worth  of Baxdella to cover her for MRSA. Her MRI came back showing osteomyelitis within the third metatarsal shaft and head and base of the third and fourth proximal phalanx. She had extensive inflammatory changes throughout the soft tissue of the lateral forefoot. With an ill-defined fluid around the fourth metatarsal extending into the plantar and  dorsal soft tissues 09/19/17; the patient is actually on oral Septra and Flagyl. She apparently refused IV vancomycin. She also saw [name] at my request who is planning her for a left BKA sometime in mid July. MRI showed osteomyelitis within the third metatarsal shaft and head and the basis of the third and fourth proximal phalanx. I believe there was felt to be possible septic arthritis involving the third MTP. 09/26/17; the patient went back to Dr. Megan Salon at my suggestion and is now receiving IV daptomycin. Her wound continues to look quite good making the decision to proceed with a transmetatarsal amputation although more difficult for the patient. I believe in my extensive discussions with her she has a good sense of the pros and cons of this. I don't NV the tuft decision she has to make. She has an appointment with [name] I believe in mid July and I previously spoken to him about this issue Has we had used 3 previous Dermagraft. Given the condition of the wound surface I went ahead and added the fourth one today area and I did this not fully realizing that she'll be traveling to [place] next week. I'm hopeful she can come back in 2 weeks 10/21/17; Her same Dermagraft on for about 3-1/2 weeks. In spite of this the wound arrives looking quite healthy. There is been a lot of healing dimensions are smaller. Looking at the square shaped wound she has now there is some undermining and some depth medially under the undermining although I cannot palpate any bone. No surrounding infection is obvious. She has difficult questions about how to look at this  going forward vis--vis amputations versus continued medical therapy. To be truthful the wound is looks so healthy and it is continued to contract. Hard to justify foot surgery at this point although I still told her that I think it might come to that if we are not able to eradicate the underlying MRSA. She is still highly at risk and she understands this 11/06/17 on evaluation today patient appears to be doing better in regard to her foot ulcer. She's been tolerating the dressing changes without complication. Currently she is here for her Dermagraft #6. Her wound continues to make excellent progress at this point. She does not appear to have any evidence of infection which is good news. 11/13/17 on evaluation today patient appears to be doing excellent at this time. She is here for repeat Dermagraft application. This is #7. Overall her wound seems to be making great progress. 12/05/17; the patient arrives with the wound in much better condition than when I last saw this almost 6 weeks ago. She still has a small probing area in the left metatarsal head region on the lateral aspect of her foot. We applied her last Dermagraft today. Since the last time she is here she has what appears to a been a blood blister on the plantar aspect of left foot although I don't see this is threatening. There is also a thick raised tissue on the right mid metatarsal head region. This was not there I don't think the last time she was here 3 weeks ago. 12/12/17; the patient continues to have a small programming area in the left metatarsal head region on the lateral aspect of her foot which was the initial large surgical wound. I applied her last Apligraf last week. I'm going to use Endoform starting today Unfortunately she has an excoriated area in the left mid foot and the right mid  foot. The left midfoot looks like a blistered area this was not opened last week it certainly is open today. Using silver alginate on these  areas. She promises me she is offloading this. 12/19/17; the small probing area in the left metatarsal head eyes think is shallower. In general her original wound looks better. We've been using Endoform. The area inferiorly that I think was trauma last week still requires debridement a lot of nonviable surface which I removed. She still has an open open area distally in her foot Similarly on the right foot there is tightly adherent surface debris which I removed. Still areas that don't look completely epithelialized. This is a small open area. We used silver alginate on these areas 12/26/2017; the patient did not have the supplies we ordered from last week including the Endoform. The original large wound on the left lateral foot looks healthy. She still has the undermining area that is largely unchanged from last week. She has the same heavily callused raised edged wounds on the right mid and left midfoot. Both of these requiring debridement. We have been using silver alginate on these areas 01/02/2018; there is still supply issues. We are going to try to use Prisma but I am not sure she actually got it from what she is saying. She has a new open area on the lateral aspect of the left fourth toe [previous fifth ray amputation]. Still the one tunneling area over the fourth metatarsal head. The area is in the midfoot bilaterally still have thick callus around them. She is concerned about a raised swelling on the lateral aspect of the foot. However she is completely insensate 01/10/2018; we are using Prisma to the wounds on her bilateral feet. Surprisingly the tunneling area over the left fourth metatarsal head that was part of her original surgery has closed down. She has a small open area remaining on the incision line. 2 open areas in the midfoot. 02/10/2018; the patient arrives back in clinic after a month hiatus. She was traveling to visit family in [place]. Is fairly clear she was not offloading  the areas on her feet. The original wound over the left lateral foot at the level of metatarsal heads is reopened and probes medially by about a centimeter or 2. She notes that a week ago she had purulent drainage come out of an area on the left midfoot. Paradoxically the worst area is actually on the right foot is extensive with purulent drainage. We will use silver alginate today 02/17/2018; the patient has 3 wounds one over the left lateral foot. She still has a small area over the metatarsal heads which is the remnant of her original surgical wound. This has medial probing depth of roughly 1.4 cm somewhat better than last week. The area on the right foot is larger. We have been using silver alginate to all areas. The area on the right foot and left foot that we cultured last week showed both Klebsiella and Proteus. Both of these are quinolone sensitive. The patient put her's self on Bactrim and Flagyl that she had left hanging around from prior antibiotic usages. She was apparently on this last week when she arrived. I did not realize this. Unfortunately the Bactrim will not cover either 1 of these organisms. We will send in Cipro 500 twice daily for a week 03/04/2018; the patient has 2 wounds on the left foot one is the original wound which was a surgical wound for a deep DFU. At one point  this had exposed bone. She still has an area over the fourth metatarsal head that probes about 1.4 cm although I think this is better than last week. I been using silver nitrate to try and promote tissue adherence and been using silver alginate here. She also has an area in the left midfoot. This has some depth but a small linear wound. Still requiring debridement. On the right midfoot is a circular wound. A lot of thick callus around this area. We have been using silver alginate to all wound areas She is completed the ciprofloxacin I gave her 2 weeks ago. 03/11/2018; the patient continues to have 2 open  areas on the left foot 1 of which was the original surgical wound for a deep DFU. Only a small probing area remains although this is not much different from last week we have been using silver alginate. The other area is on the midfoot this is smaller linear but still with some depth. We have been using silver alginate here as well On the right foot she has a small circular wound in the mid aspect. This is not much smaller than last time. We have been using silver alginate here as well 03/18/2018; she has 3 wounds on the left foot the original surgical wound, a very superficial wound in the mid aspect and then finally the area in the mid plantar foot. She arrives in today with a very concerning area in the wound in the mid plantar foot which is her most proximal wound. There is undermining here of roughly 1-1/2 cm superiorly. Serosanguineous drainage. She tells me she had some pain on for over the weekend that shot up her foot into her thigh and she tells me that she had a nodule in the groin area. She has the single wound in the right foot. We are using endoform to both wound areas 03/24/2018; the patient arrives with the original surgical wound in the area on the left midfoot about the same as last week. There is a collection of fluid under the surface of the skin extending from the surgical wound towards the midfoot although it does not reach the midfoot wound. The area on the right foot is about the same. Cultures from last week of the left midfoot wound showed abundant Klebsiella abundant Enterococcus faecalis and moderate methicillin resistant staph I gave her Levaquin but this would have only covered the Klebsiella. She will need linezolid 04/01/2018; she is taking linezolid but for the first few days only took 1 a day. I have advised her to finish this at twice daily dosing. In any case all of her wounds are a lot better especially on the left foot. The original surgical wound is closed.  The area on the left midfoot considerably smaller. The area on the right foot also smaller. 04/08/2018; her original surgical wound/osteomyelitis on the left foot remains closed. She has area on the left foot that is in the midfoot area but she had some streaking towards this. This is not connected with her original wound at least not visually. Small wound on the right midfoot appears somewhat smaller. 04/15/18; both wounds looks better. Original wound is better left midfoot. Using silver alginate 1/21; patient states she uses saltwater soak in, stones or remove callus from around her wounds. She is also concerned about a blood blister she had on the left foot but it simply resolved on its own. We've been using silver alginate 1/28; the patient arrives today with the same streaking  area from her metatarsals laterally [the site of her original surgical wound] down to the middle of her foot. There is some drainage in the subcutaneous area here. This concerns me that there is actually continued ongoing infection in the metatarsals probably the fourth and third. This fixates an MRI of the foot without contrast [chronic renal failure] The wound in the mid part of the foot is small but I wonder whether this area actually connects with the more distal foot. The area on the right midfoot is probably about the same. Callus thick skin around the small wound which I removed with a curette we have been using silver alginate on both wound areas 2/4; culture I did of the draining site on the left foot last time grew methicillin sensitive staph aureus. MRI of the left foot showed interval resolution of the findings surrounding the third metatarsal joint on the prior study consistent with treated osteomyelitis. Chronic soft tissue ulceration in the plantar and lateral aspect of the forefoot without residual focal fluid collection. No evidence of recurrent osteomyelitis. Noted to have the previous amputation of the  distal first phalanx and fifth ray MRI of the right foot showed no evidence of osteomyelitis I am going to treat the patient with a prolonged course of antibiotics directed against MSSA in the left foot 2/11; patient continues on cephalexin. She tells me she had nausea and vomiting over the weekend and missed 2 days. In general her foot looks much the same. She has a small open area just below the left fourth metatarsal head. A linear area in the left midfoot. Some discoloration extending from the inferior part of this into the left lateral foot although this appears to be superficial. She has a small area on the right midfoot which generally looks smaller after debridement 2/18; the patient is completing his cephalexin and has another 2 days. She continues to have open areas on the left and right foot. 2/25; she is now off antibiotics. The area on the left foot at the site of her original surgical wound has closed yet again. She still has open areas in the mid part of her foot however these appear smaller. The area on the right mid foot looks about the same. We have been using silver alginate She tells me she had a serious hypoglycemic spell at home. She had to have EMS called and get IV dextrose 3/3; disappointing on the left lateral foot large area of necrotic tissue surrounding the linear area. This appears to track up towards the same original surgical wound. Required extensive debridement. The area on the right plantar foot is not a lot better also using silver 3/12; the culture I did last time showed abundant enterococcus. I have prescribed Augmentin, should cover any unrecognized anaerobes as well. In addition there were a few MRSA and Serratia that would not be well covered although I did not want to give her multiple antibiotics. She comes in today with a new wound in the right midfoot this is not connected with the original wound over her MTP a lot of thick callus tissue around both  wounds but once again she said she is not walking on these areas 3/17-Patient comes in for follow-up on the bilateral plantar wounds, the right midfoot and the left plantar wound. Both these are heavily callused surrounding the wounds. We are continuing to use silver alginate, she is compliant with offloading and states she uses a wheelchair fairly often at home 3/24; both wound areas  have thick callus. However things actually look quite a bit better here for the majority of her left foot and the right foot. 3/31; patient continues to have thick callused somewhat irritated looking tissue around the wounds which individually are fairly superficial. There is no evidence of surrounding infection. We have been using silver alginate however I change that to [facility] today 4/17; patient returns to clinic after having a scare with Covid she tested negative in her primary doctor's office. She has been using Hydrofera Blue. She does not have an open area on the right foot. On the left foot she has a small open area with the mid area not completely viable. She showed me pictures of what looks like a hemorrhagic blister from several days ago but that seems to have healed over this was on the lateral left foot 4/21; patient comes in to clinic with both her wounds on her feet closed. However over the weekend she started having pain in her right foot and leg up into the thigh. She felt as though she was running a low-grade fever but did not take her temperature. She took a doxycycline that she had leftover and yesterday a single Septra and metronidazole. She thinks things feel somewhat better. 4/28; duplex ultrasound I ordered last week was negative for DVT or superficial thrombophlebitis. She is completed the doxycycline I gave her. States she is still having a lot of pain in the right calf and right ankle which is no better than last week. She cannot sleep. She also states she has a temperature of up  to 101, coughing and complaining of visual loss in her bilateral eyes. Apparently she was tested for Covid 2 weeks ago at [facility] and that was negative. Readmission: 09/03/18 patient presents back for reevaluation after having been evaluated at the end of April regarding erythema and swelling of her right lower extremity. Subsequently she ended up going to the hospital on 07/29/18 and was admitted not to be discharged until 08/08/18. Unfortunately it was noted during the time that she was in the hospital that she did have methicillin-resistant Staphylococcus aureus as the infection noted at the site. It was also determined that she did have osteomyelitis which appears to be fairly significant. She was treated with vancomycin and in fact is still on IV vancomycin at dialysis currently. This is actually slated to continue until 09/12/18 at least which will be the completion of the six weeks of therapy. Nonetheless based on what I'm seeing at this point I'm not sure she will be anywhere near ready to discontinue antibiotics at that time. Since she was released from the hospital she was seen by [name] who is her podiatrist on 08/27/18. His note specifically states that he is recommended that the patient needs of one knee amputation on the right as she has a life-threatening situation that can lead quickly to sepsis. The patient advised she would like to try to save her leg to which [name] apparently told her that this was against all medical advice. She also want to discontinue the Wound VAC which had been initiated due to the fact that she wasn't pleased with how the wound was looking and subsequently she wanted to pursue applying Medihoney at that time. He stated that he did not believe that the right lower extremity was salvageable and that the patient understood but would still like to attempt hyperbaric option therapy if it could be of any benefit. She was therefore referred back to  [place] for further  evaluation. He plans to see her back next week. Upon inspection today patient has a significant amount purulent drainage noted from the wound at this point. The bone in the distal portion of her foot also appears to be extremely necrotic and spongy. When I push down on the bone it bubbles and seeps purulent drainage from deeper in the end of the foot. I do not think that this is likely going to heal very well at all and less aggressive surgical debridement were undertaken more than what I believe we can likely do here in our office. 09/12/2018; I have not seen this patient since the most recent hospitalization although she was in our clinic last week. I have reviewed some of her records from a complex hospitalization. 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 There is situation was complicated by MRSA bacteremia and acute on chronic renal failure now on dialysis. She is receiving vancomycin at dialysis. We started her on Dakin's wet-to- dry last week she is changing this daily. There is still purulent drainage coming out of her foot. Although she is apparently "agreeable" to a below-knee amputation which is been suggested by multiple clinicians she wants this to be done in [place]. She apparently has a telehealth visit with that provider sometime in late [place] 6/24. I have told her I think this is probably too long. Nevertheless I could not convince her to allow a local doctor to perform BKA. 09/19/2018; the patient has a large necrotic area on the right anterior foot. She has had previous transmetatarsal amputations. Culture I did last week showed MRSA nothing else she is on vancomycin at dialysis. She has continued leaking purulent drainage out of the distal part of the large circular wound on the right anterior foot. She apparently went to see Dr. 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 Somehow that translated  into her being referred to plastic surgery for debridement of the area. I gather she basically refused amputation although I do not have a copy of Dr. Berenice Primas notes. The patient really wants to have a trial of hyperbaric oxygen. I agreed with initial assessment in this clinic that this was probably too far along to benefit however if she is going to have plastic surgery I think she would benefit from ancillary hyperbaric oxygen. The issue here is that the patient has benefited as maximally as any patient I have ever seen from hyperbaric oxygen therapy. Most recently she had exposed bone on the lateral part of her left foot after a surgical procedure and that actually has closed. She has eschared areas in both heels but no open area. She is remained systemically well. I am not optimistic that anything can be done about this but the patient is very clear that she wants an attempt. The attempt would include a wound VAC further debridements and hyperbaric oxygen along with IV antibiotics. 6/26; I put her in for a trial of hyperbaric oxygen only because of the dramatic response she has had with wounds on her left midfoot earlier this year which was a surgical wound that went straight to her bone over the metatarsal heads and also remotely the left third toe. We will see if we can get this through our review process and insurance. She arrives in clinic with again purulent material pouring out of necrotic bone on the top of the foot distally. There is also some concerning erythema on  the front of the leg that we marked. It is bit difficult to tell how tender this is because of neuropathy. I note from infectious disease that she had her vancomycin extended. 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 She had the wound VAC on for part of the week. The rest of the time she is putting various things on this including Medihoney, "ionized water" silver sorb gel etc. 7/7; follow-up  along with HBO. She is still on vancomycin at dialysis. She has a large open area on the dorsal right foot and a small dark eschar area on her heel. There is a lot less erythema in the area and a lot less tenderness. From an infection point of view I think this is better. She still has a lot of necrosis in the remaining right forefoot [previous TMA] we are still using the wound VAC in this area 7/16; follow-up along with HBO. I put her on linezolid after she finished her vancomycin. We started this last Friday I gave her 2 weeks worth. I had the expectation that she would be operatively debrided by Dr. Marla Roe but that still has not happened yet. Patient phoned the office this week. She arrives for review today after HBO. The distal part of this wound is completely necrotic. Nonviable pieces of tendon bone was still purulent drainage. Also concerning that she has black eschar over the heel that is expanding. I think this may be indicative of infection in this area as well. She has less erythema and warmth in the ankle and calf but still an abnormal exam 7/21 follow-up along with HBO. I will renew her linezolid after checking a CBC with differential monitoring her blood counts especially her platelets. She was supposed to have surgery yesterday but if I am reading things correctly this was canceled after her blood sugar was found to be over 500. I thought Dr. Marla Roe who called me said that they were sending her to the ER but the patient states that was not the case. 7/28. Follow-up along with HBO. She is on linezolid I still do not have any lab work from dialysis even though I called last week. The patient is concerned about an area on her left lateral foot about the level of the base of her fifth metatarsal. I did not really see anything that ominous here however this patient is in [place] ability to point out problems that she is sensing and she has been accurate in the past Finally she  received a call from Dr. Marla Roe who is referring her to another orthopedic surgeon stating that she is too booked up to take her to the operating room now. Was still using a wound VAC on the foot 8/3 -Follow-up after HBO, she is got another week of linezolid, she is to call ID for an appointment, x-rays of both feet were reviewed, the left foot x-ray with third MTP joint osteo- Right foot x-ray widespread osteo-in the right midfoot Right ankle x-ray does not show any active evidence of infection 8/11-Patient is seen after HBO, the wounds on the right foot appear to be about the same, the heel wound had some necrotic base over tendon that was debrided with a curette 8/21; patient is seen after HBO. The patient's wound on her dorsal foot actually looks reasonably good and there is substantial amount of epithelialization however the open area distally still has a lot of necrotic debris partially bone. I cannot really get a good  sense of just how deep this probes under the foot. She has been pressuring me this week to order medical maggots through a company in [place] for her. The problem I have is there is not a defined wound area here. On the positive side there is no purulence. She has been to see infectious disease she is still on Septra DS although I have not had a chance to review their notes 8/28; patient is seen in conjunction with HBO. The wounds on her foot continued to improve including the right dorsal foot substantially the, the distal part of this wound and the area on the right heel. We have been using a wound VAC over this chronically. She is still on trimethoprim as directed by infectious disease 9/4; patient is seen in conjunction with HBO. Right dorsal foot wound substantially anteriorly is better however she continues to have a deep wound in the distal part of this that is not responding. We have been using silver collagen under border foam Area on the right plantar medial  heel seems better. We have been using Hydrofera Blue 12/12/18 on evaluation today patient appears to be doing about the same with regard to her wound based on prior measurements. She does have some necrotic tissue noted on the lateral aspect of the wound that is going require a little bit of sharp debridement today. This includes what appears to be potentially either severely necrotic bone or tendon. Nonetheless other than that she does not appear to have any severe infection which is good news 9/18; it is been 2 weeks since I saw this wound. She is tolerating HBO well. Continued dramatic improvement in the area on the right dorsal foot. She still has a small wound on the heel that we have been using Hydrofera Blue. She continues with a wound VAC 9/24; patient has to be seen emergently today with a swelling on her right lateral lower leg. She says that she told Dr. Evette Doffing about this and also myself on a couple of occasions but I really have no recollection of this. She is not systemically unwell and her wound really looked good the last time I saw this. She showed this to providers at dialysis and she was able to verify that she was started on cephalexin today for 5 doses at dialysis. She dialyzes on Tuesday Thursday and Saturday. 10/2; patient is seen in conjunction with HBO. The area that is draining on the right anterior medial tibia is more extensive. Copious amounts of serosanguineous drainage with some purulence. We are still using the wound VAC on the original wound then it is stable. Culture I did of the original IandD showed MRSA I contacted dialysis she is now on vancomycin with dialysis treatments. I asked them to run a month 10/9; patient seen in conjunction with HBO. She had a new spontaneous open area just above the wound on the right medial tibia ankle. More swelling on the right medial tibia. Her wound on the foot looks about the same perhaps slightly better. There is no warmth  spreading up her leg but no obvious erythema. her MRI of the foot and ankle and distal tib-fib is not booked for next Friday I discussed this with her in great detail over multiple days. it is likely she has spreading infection upper leg at least involving the distal 25% above the ankle. She knows that if I refer her to orthopedics for infectious disease they are going to recommend amputation and indeed I am not  against this myself. We had a good trial at trying to heal the foot which is what she wanted along with antibiotics debridement and HBO however she clearly has spreading infection [probably staph aureus/MRSA]. Nevertheless she once again tells me she wants to wait the left of the MRI. She still makes comments about having her amputation done in [place]. 10/19; arrives today with significant swelling on the lateral right leg. Last culture I did showed Klebsiella. Multidrug- resistant. Cipro was intermediate sensitivity and that is what I have her on pending her MRI which apparently is going to be done on Thursday this week although this seems to be moving back and forth. She is not systemically unwell. We are using silver alginate on her major wound area on the right medial foot and the draining areas on the right lateral lower leg 10/26; MRI showed extensive abscess in the anterior compartment of the right leg also widespread osteomyelitis involving osseous structures of the midfoot and portions of the hindfoot. Also suspicion for osteomyelitis anterior aspect of the distal medial malleolus. Culture I did of the purulence once again showed a multidrug-resistant Klebsiella. I have been in contact with nephrology late last week and she has been started on cefepime at dialysis to replace the vancomycin We sent a copy of her MRI report to Dr. Geroge Baseman in [place] who is an orthopedic surgeon. The patient takes great stock in his opinion on this. She says she will go to [place] to  have her leg amputated if Dr. Geroge Baseman does not feel there is any salvage options. 11/2; she still is not talk to her orthopedic surgeon in [place]. Apparently he will call her at [phone] this afternoon. The quality of this is she has not allowed me to refer her anywhere. She has been told over and over that she needs this amputated but has not agreed to be referred. She tells me her blood sugar was 600 last night but she has not been febrile. 11/9; she never did got a call from the orthopedic surgeon in [place] therefore that is off the radar. We have arranged to get her see orthopedic surgery at [facility]. She still has a lot of draining purulence coming out of the new abscess in her right leg although that probably came from the osteomyelitis in her right foot and heel. Meanwhile the original wound on the right foot looks very healthy. Continued improvement. The issue is that the last MRI showed osteomyelitis in her right foot extensively she now has an abscess in the right anterior lower leg. There is nobody in [place] who will offer this woman anything but an amputation and to be honest that is probably what she needs. I think she still wants to talk about limb salvage although at this point I just do not see that. She has completed her vancomycin at dialysis which was for the original staph aureus she is still on cefepime for the more recent Klebsiella. She has had a long course of both of these antibiotics which should have benefited the osteomyelitis on the right foot as well as the abscess. 11/16; apparently [place] elective surgery is shut down because of COVID-19 pandemic. I have reached out to some contacts at [facility] to see if we can get her an orthopedic appointment there. I am concerned about continually leaving this but for the moment everything is static. In fact her original large wound on this foot is closing down. It is the abscess on the right anterior leg that  continues to drain purulent serosanguineous material. She is not currently on any antibiotics however she had a prolonged course of vancomycin [1 month] as well as cefepime for a month 02/24/2019 on evaluation today patient appears to be doing better than the last time I saw her. This is not a patient that I typically see. With that being said I am covering for Dr. Dellia Nims this week and again compared to when I last saw her overall the wounds in particular seem to be doing significantly better which is good news. With that being said the patient tells me several disconcerting things. She has not been able to get in to see anyone for potential debridement in regard to her leg wounds although she tells me that she does not think it is necessary any longer because she is taking care of that herself. She noticed a string coming out of the lower wound on her leg over the last week. The patient states that she subsequently decided that we must of pack something in there and started pulling the string out and as it kept coming and coming she realized this was likely her tendon. With that being said she continued to remove as much of this as she could. She then I subsequently proceeded to using tubes of antibiotic ointment which she will stick down into the wound and then scored as much as she can until she sees it coming out of the other wound opening. She states that in doing this she is actually made things better and there is less redness and irritation. With regard to her foot wound she does have some necrotic tendon and tissue noted in one small corner but again the actual wound itself seems to be doing better with good granulation in general compared to my last evaluation. 12/7; continued improvement in the wound on the substantial part of the right medial foot. Still a necrotic area inferiorly that required debridement but the rest of this looks very healthy and is contracting. She has 2 wounds  on the right lateral leg which were her original drainage sites from her abscess but all of this looks a lot better as well. She has been using silver alginate after putting antibiotic biotic ointment in one wound and watching it come out the other. I have talked to her in some detail today. I had given her names of orthopedic surgeons at [facility] for second opinion on what to do about the right leg. I do not think the patient never called them. She has not been able to get a hold of the orthopedic surgeon in [place] that she had put a lot of faith in as being somebody would give her an opinion that she would trust. I talked to her today and said even if I could get her in to another orthopedic surgeon about the leg which she accept an amputation and she said she would not therefore I am not going to press this issue for the moment 12/14; continued improvement in his substantial wound on the right medial foot. There is still a necrotic area inferiorly with tightly adherent necrotic debris which I have been working on debriding each time she is here. She does not have an orthopedic appointment. Since last time she was here I looked over her cultures which were essentially MRSA on the foot wound and gram- negative rods in the abscess on the anterior leg. 12/21; continued improvement in the area on the right medial foot. She is not up  on this much and that is probably a good thing since I do not know it could support continuous ambulation. She has a small area on the right lateral leg which were remanence of the IandD's I did because of the abscess. I think she should probably have prophylactic antibiotics I am going to have to look this over to see if we can make an intelligent decision here. In the meantime her major wound is come down nicely. Necrotic area inferiorly is still there but looks a lot better 04/06/2019; she has had some improvement in the overall surface area on the right medial  foot somewhat narrowedr both but somewhat longer. The areas on the right lateral leg which were initial IandD sites are superficial. Nothing is present on the right heel. We are using silver alginate to the wound areas Electronic Signature(s) Signed: 04/06/2019 4:56:38 PM By: Linton Ham MD Entered By: Linton Ham on 04/06/2019 11:46:22 -------------------------------------------------------------------------------- Physical Exam Details Patient Name: Date of Service: [name] 04/06/2019 10:00 AM Medical Record Number:[id number] Patient Account Number: [id number] Date of Birth/Sex: Treating RN: 07/27/1970 (49 y.o. F) Primary Care Provider: [name] Other Clinician: Referring Provider: Treating Provider/Extender:Tayah Idrovo, [name], [name] in Treatment: 61 Constitutional Patient is hypertensive.. Pulse regular and within target range for patient.Marland Kitchen Respirations regular, non-labored and within target range.. Temperature is normal and within the target range for the patient.Marland Kitchen Appears in no distress. Cardiovascular There is no clear evidence of reaccumulation of the fluid in the right lateral/anterior leg. Notes Wound exam; continued albeit slight improvement today. The wound on the medial foot appears to have changed configuration of it but generally I think there is been more epithelialization. Small necrotic area at the base of this wound I removed with pickups and a #15 scalpel. This is definitely improved at the base of this area appears to be healthy tissue Electronic Signature(s) Signed: 04/06/2019 4:56:38 PM By: Linton Ham MD Entered By: Linton Ham on 04/06/2019 11:47:44 -------------------------------------------------------------------------------- Physician Orders Details Patient Name: Date of Service: [name] 04/06/2019 10:00 AM Medical Record Number:[id number] Patient Account Number: [id number] Date of Birth/Sex: Treating  RN: 07-27-1970 (49 y.o. Nancy Fetter Primary Care Provider: [name] Other Clinician: Referring Provider: Treating Provider/Extender:Sheryl Towell, [name], [name] in Treatment: 30 Verbal / Phone Orders: No Diagnosis Coding ICD-10 Coding Code Description [phone] Other chronic osteomyelitis, right ankle and foot L97.514 Non-pressure chronic ulcer of other part of right foot with necrosis of bone E10.621 Type 1 diabetes mellitus with foot ulcer L02.415 Cutaneous abscess of right lower limb L97.812 Non-pressure chronic ulcer of other part of right lower leg with fat layer exposed Follow-up Appointments Return Appointment in 2 weeks. Dressing Change Frequency Wound #43 Right,Medial Foot Change dressing three times week. Wound #47 Right,Anterior Lower Leg Change dressing three times week. Wound Cleansing Clean wound with Wound Cleanser - Anasept to all wounds Primary Wound Dressing Wound #43 Right,Medial Foot Calcium Alginate with Silver Wound #47 Right,Anterior Lower Leg Calcium Alginate with Silver Secondary Dressing Wound #43 Right,Medial Foot Kerlix/Rolled [name] ABD pad Wound #47 Right,Anterior Lower Leg Kerlix/Rolled [name] ABD pad Edema Control Elevate legs to the level of the heart or above for 30 minutes daily and/or when sitting, a frequency of: - throughout the day [facility] for wound care. - Interim Patient Medications Allergies: Cleocin, lisinopril Notifications Medication Indication Start End sulfamethoxazole-trimethoprim prophylaxis 04/06/2019 osteo right foot DOSE oral 800 mg-160 mg tablet - 1 tablet oral daily for 30  days. after dialysis on dialysis days Electronic Signature(s) Signed: 04/06/2019 11:35:17 AM By: Linton Ham MD Entered By: Linton Ham on 04/06/2019 11:35:14 -------------------------------------------------------------------------------- Problem List Details Patient Name: Date of  Service: [name] 04/06/2019 10:00 AM Medical Record Number:[id number] Patient Account Number: [id number] Date of Birth/Sex: Treating RN: 1970/09/02 (49 y.o. Nancy Fetter Primary Care Provider: [name] Other Clinician: Referring Provider: Treating Provider/Extender:Derald Lorge, [name], [name] in Treatment: 30 Active Problems ICD-10 Evaluated Encounter Code Description Active Date Today Diagnosis M86.671 Other chronic osteomyelitis, right ankle and foot 09/03/2018 No Yes L97.514 Non-pressure chronic ulcer of other part of right foot 09/03/2018 No Yes with necrosis of bone E10.621 Type 1 diabetes mellitus with foot ulcer 09/24/2018 No Yes L97.812 Non-pressure chronic ulcer of other part of right lower 02/24/2019 No Yes leg with fat layer exposed Inactive Problems Resolved Problems ICD-10 Code Description Active Date Resolved Date L02.415 Cutaneous abscess of right lower limb 12/25/2018 12/25/2018 Electronic Signature(s) Signed: 04/06/2019 4:56:38 PM By: Linton Ham MD Entered By: Linton Ham on 04/06/2019 11:44:42 -------------------------------------------------------------------------------- Progress Note Details Patient Name: Date of Service: [name] 04/06/2019 10:00 AM Medical Record Number:[id number] Patient Account Number: [id number] Date of Birth/Sex: Treating RN: Sep 04, 1970 (50 y.o. F) Primary Care Provider: [name] Other Clinician: Referring Provider: Treating Provider/Extender:Calyssa Zobrist, [name], [name] in Treatment: 30 Subjective History of Present Illness (HPI) 49 year old diabetic who is known to have type 1 diabetes which is poorly controlled last hemoglobin A1c was 11%. She comes in with a ulcerated area on the left lateral foot which has been there for over 6 months. Was recently she has been treated by [name] of podiatry who saw her last on 05/28/2016. Review of his notes revealed that the patient  had incision and drainage with placement of antibiotic beads to the left foot on 04/11/2016 for possible osteomyelitis of the cuboid bone. Over the last year she's had a history of amputation of the left fifth toe and a femoropopliteal popliteal bypass graft somewhere in April 2017. 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 His note [name] mentions that the patient has been referred to me for further wound care and possibly great candidate for hyperbaric oxygen therapy due to recurrent osteomyelitis. However we do not have any x-rays of biopsy reports confirming this. He has been on several antibiotics including Bactrim and most recently is on doxycycline for an MRSA. I understand, the patient was not a candidate for IV antibiotics as she has had previous PICC lines which resulted in blood clots in both arms. There was a x-ray report dated 04/04/2016 on [name] notes which showed evidence of fifth ray resection left foot with osteolytic changes noted to the fourth metatarsal and cuboid bone on the left. 06/13/2016 -- had a left foot x-ray which showed no acute fracture or dislocation and no definite radiographic evidence of osteomyelitis. Advanced osteopenia was seen. 06/20/2016 -- she has noticed a new wound on the right plantar foot in the region where she had a callus before. 06/27/16- the patient did have her x-ray of the right foot which showed no findings to suggest osteomyelitis. She saw her endocrinologist, [name], yesterday. Her A1c in January was 11. He also indicates mismanagement and noncompliance regarding her diabetes. She is currently on Bactrim for a lip infection. She is complaining of nausea, vomiting and diarrhea. She is unable to articulate the exact orders or dosing of the Bactrim; it is unclear when she will complete this. 07/04/2016 --  results from [place] with ankle waveforms were noted from 02/14/2016. The examination done on 06/27/2015  showed noncompressible ABIs with the right being 1.45 and the left being 1.33. The present examination showed a right ABI of 1.19 on the left of 1.33. The conclusion was that right normal ABI in the lower extremity at rest however compared to previous study which was noncompressible ABI may be falsely elevated side suggesting medial calcification. The left ABI suggested medial calcification. 08/01/2016 -- the patient had more redness and pain on her right foot and did not get to come to see as noted she see her PCP or go to the ER and decided to take some leftover metronidazole which she had at home. As usual, the patient does report she feels and is rather noncompliant. 08/08/2016 -- -- x-ray of the right foot -- FINDINGS:Transmetatarsal amputation is noted. No bony destruction is noted to suggest osteomyelitis. IMPRESSION: No evidence of osteomyelitis. Postsurgical changes are seen. MRI would be more sensitive for possible bony changes. Culture has grown Serratia Marcescens -- sensitive to Bactrim, ciprofloxacin, ceftazidime she was seen by [name] on 08/06/2016. He did not find any exposed bone, muscle, tendon, ligament or joint. There was no malodor and he did a excisional debridement in the office. ============ Old notes: 49 year old patient who is known to the wound clinic for a while had been away from the wound clinic since 09/01/2014. Over the last several months she has been admitted to various hospitals including [place] at [facility]. She was treated for a right metatarsal osteomyelitis with a transmetatarsal amputation and this was done about 2 months ago. He has a small ulcerated area on the right heel and she continues to have an ulcerated area on the left plantar aspect of the foot. The patient was recently admitted to the [facility] hospital group between 7/12 and 10/18/2014. she was given 3 weeks of IV vancomycin and was to follow-up with her surgeons at [facility] and also took oral vancomycin for C. difficile colitis. 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 MRI of the left foot done on 10/14/2014 showed no abscess or osteomyelitis. 04/27/15; this is a patient we know from previous stays in the wound care center. She is a type I diabetic I am not sure of her control currently. Since the last time I saw her she is had a right transmetatarsal amputation and has no wounds on her right foot and has no open wounds. She is been followed at the wound care center at [facility] in [place]. She comes today with the desire to undergo hyperbaric treatment locally. Apparently one of her wound care providers in [place] has suggested hyperbarics. This is in response to an MRI from 04/18/15 that showed increased marrow signal and loss of the proximal fifth metatarsal cortex evidence of osteomyelitis with likely early osteomyelitis in the cuboid bone as well. She has a large wound over the base of the fifth metatarsal. She also has a eschar over her the tips of her toes on 1,3 and 5. She does not have peripheral pulses and apparently is going for an angiogram tomorrow which seems reasonable. After this she is going to infectious disease at [facility]. They have been using Medihoney to the large  wound on the lateral aspect of the left foot to. The patient has known Charcot deformity from diabetic neuropathy. She also has known diabetic PAD. Surprisingly I can't see that she has had any recent antibiotics, the patient states the last antibiotic she had was at the end of November for 10 days. I think this was in response to culture that showed group G strep although I'm not  exactly sure where the culture was from. She is also had arterial studies on 03/29/15. This showed a right ABI of 1.4 that was noncompressible. Her left ABI was 0.73. 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 It was not felt that arterial inflow was adequate for healing of a foot ulcer. Her Doppler waveforms looked monophasic ===== READMISSION 02/28/17; this is in an now 49 year old woman we've had at several different occasions in this clinic. 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 She has a remote ex-smoker. She was last seen in this clinic by Dr. Con Memos I think in May. More recently she is been followed by her podiatrist [name] an infectious disease Dr. Megan Salon. She has 2 open wounds the major one is over the right first metatarsal head she also has a wound on the left plantar foot. an MRI of the right foot on 01/01/17 showed a soft tissue ulcer along the plantar aspect of the first metatarsal base consistent with osteomyelitis of the first metatarsal stump. Dr. 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 According to the patient this is been open for slightly over a month. She has been on a combination of Cipro 500 twice a day, Zyvox 600 twice a day and Flagyl 500 3 times a day for over a month now as directed by Dr. Megan Salon. cultures of the right foot earlier this year showed MRSA in January and Serratia in May. January also had a few viridans strep. Recent x-rays of both feet were done and [name] office and I don't have these reports. The patient has known PAD and has a history of aleft femoropopliteal bypass in April 2017. 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 Dwyane Dee. In spite of this the last hemoglobin A1c I can see is  10.1 on 01/01/2017. She is being referred by 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 She is been using Medihoney to this area and also an area on the left midfoot. She is using healing sandals bilaterally. ABIs in this clinic at the left posterior tibial was 1.1 noncompressible on the right READMISSION Non invasive vascular NOVANT 5/18 Aftercare following surgery of the circulatory system Procedure Note - Interface, External Ris In - 08/13/2016 11:05 AM EDT Procedure: Examination consists of physiologic resting arterial pressures of the brachial and ankle arteries bilaterally with continuous wave Doppler waveform analysis. Previous: Previous exam performed on 02/14/16 demonstrated ABIs of Rt = 1.19 and Lt = 1.33. Right: ABI = non-compressible PT, 1.47 DP. S/P transmet amputation. Left: ABI = 1.52, 2nd digit pressure = 87 mmHg Conclusions: Right: ABI (>1.3) may be falsely elevated, suggesting medial calcification. Left: ABI (>1.3) may be falsely elevated, suggesting medial calcification The patient is a now 49 year old type I diabetic is  had multiple issues her graded to chronic diabetic foot ulcers. 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 We had her in the clinic lastin November. At that point she had wounds on her bilateral feet.she had wanted to try hyperbarics however the healogics review process denied her because she hadn't followed up with her vascular surgeon for her left femoropopliteal bypass. The bypass was done by Dr. Raul Del at [facility]. We made her a follow-up with Dr. Raul Del however she did not keep the appointment and therefore she was not approved The patient shows me a small wound on her left fourth metatarsal head on her phone. She developed rapid discoloration  in the plantar aspect of the left foot and she was admitted to hospital from 2/2 through 05/10/17 with wet gangrene of the left foot osteomyelitis of the fourth metatarsal heads. She was admitted acutely ill with a temperature of 103. She was started on broad-spectrum vancomycin and cefepime. On 05/06/17 she was taken to the OR by [name] her podiatric surgeon for an incision and drainage irrigation of the left foot wound. Cultures from this surgery revealed group be strep and anaerobes. she was seen by Dr.Xu of orthopedic surgery and scheduled for a below-knee amputation which she u refused. Ultimately she was discharged on Levaquin and Flagyl for one month. MRI 05/05/17 done while she was in the hospital showed abscess adjacent to the fourth metatarsal head and neck small abscess around the fourth flexor tendon. Inflammatory phlegmon and gas in the soft tissues along the lateral aspect of the fourth phalanx. Findings worrisome for osteomyelitis involving the fourth proximal and middle phalanx and also the third and fourth metatarsals. Finally the patient had actually shortly before this followed up with Dr. Raul Del at no time on 04/29/17. He felt that her left femoropopliteal bypass was patent he felt that her left-sided toe pressures more than adequate for healing a wound on the left foot. This was before her acute presentation. 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 05/28/17; she is started hyperbarics. The patient tells me that for some reason she was not actually on Levaquin but I think on ciprofloxacin. She was on Flagyl. She only started her Levaquin yesterday due to some difficulty with the pharmacy and perhaps her sister picking it up. She has an appointment with [name] tomorrow and with infectious disease early next week. She has no new complaints 06/06/17; the patient continues in hyperbarics. She saw [name] on 05/29/17 who is her podiatric surgeon. He is elected for a transmetatarsal  amputation on 06/27/17. I'm not sure at what level he plans to do this amputation. The patient is unaware ooShe also saw Dr. Megan Salon of infectious disease who elected to continue her on current antibiotics I think this is ciprofloxacin and Flagyl. I'll need to clarify with her tomorrow if she actually has this. We're using silver alginate to the actual wound. Necrotic surface today with material under the flap of her foot. ooOriginal MRI showed abscesses as well as osteomyelitis of the proximal and middle fourth phalanx and the third and fourth metatarsal heads 06/11/17; patient continues in hyperbarics and continues on oral antibiotics. She is doing well. The wound looks better. The necrotic part of this under the flap in her superior foot also looks better. she is been to see [name]. I haven't had a chance to look at his note. Apparently he has put the transmetatarsal amputation on hold her request  it is still planning to take her to the OR for debridement and product application ACEL. I'll see if I can find his note. I'll therefore leave product ordering/requests to [name] for now. I was going to look at Dermagraft 06/18/17-she is here in follow-up evaluation for bilateral foot wounds. She continues with hyperbaric therapy. She states she has been applying manuka honey to the right plantar foot and alternate manuka honey and silver alginate to the left foot, despite our orders. We will continue with same treatment plan and she will follo up next week. 06/25/17; I have reviewed [name] last note from 3/11. She has operative debridement in 2 days' time. By review his note apparently they're going to place there is skin over the majority of this wound which is a good choice. She has a small satellite area at the most proximal part of this wound on the left plantar foot. The area on the right plantar foot we've been using silver alginate and it is close to healing. 07/02/17; unfortunately the  patient was not easily approved for [name] proposed surgery. I'm not completely certain what the issue is. She has been using silver alginate to the wound she has completed a first course of hyperbarics. She is still on Levaquin and Flagyl. I have really lost track of the time course here.I suspect she should have another week to 2 of antibiotics. I'll need to see if she is followed up with infectious disease Dr. Megan Salon 07/09/17; the patient is followed up with Dr. Megan Salon. 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 She continues on Levaquin and metronidazole continuing both of these for now I think she is been on fr about 6 weeks. She still has some drainage but no pain. No fever. Her had been plans for her to go to the OR for operative debridement with her podiatrist [name], I am not exactly sure where that is. I'll probably slip a note to [name] today. I note that she follows with Dr. Dwyane Dee of endocrinology. We have her recertified for hyperbaric oxygen. I have not heard about Dermagraft however I'll see if [name] is planning a skin substitute as well 07/16/17; the patient tells me she is just about out of [employer]. I'll need to check Dr. Hale Bogus last notes on this. She states she has plenty of Flagyl however. She comes in today complaining of pain in the right lateral foot which she said lasted for about a day. The wound on the right foot is actually much more medially. She also tells me that the [facility] cost a lot of pain in the left foot wound and she turned back to silver alginate. Finally Dermagraft has a $578 per application co-pay. She cannot afford this 07/23/17; patient arrives today with the wound not much smaller. There is not much new to add. She has not heard from [name] all try to put in a call to them today. She was asking about Dermagraft again and she has an over $469 per application co-pay she states that she  would be willing to try to do a payment plan. I been tried to avoid this. We've been using silver alginate, I'll change to [facility] 07/30/17-She is here in follow-up evaluation for left foot ulcer. She continues hyperbaric medicine. The left foot ulcer is stable we will continue with same treatment plan 08/06/17; she is here for evaluation of her left foot ulcer.  Currently being treated for hyperbarics or underlying osteomyelitis. She is completed antibiotics. The left foot ulcer is better smaller with healthier looking granulation. For various reasons I am not really clear on we never got her back to the OR with [name]. He did not respond to my secure text message. Nevertheless I think that surgery on this point is not necessary nor am I completely clear that a skin substitute is necessary The patient is complaining about pain on the outside of her right foot. 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 There is no erythema. She also states the foot is warm versus her other part of her upper leg and this is largely true. It is not totally clear to me what's causing this. She thinks it's different from her usual neuropathy pain 08/13/17; she arrives in clinic today with a small wound which is superficial on her right first metatarsal head. 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 She tells [place] she was up on her feet over the Mother's Day celebration. ooThe large wound is on the left foot. Continues with hyperbarics for underlying osteomyelitis. We're using Hydrofera Blue. She asked me today about where we were with Dermagraft. I had actually excluded this because of the co- pay however she wants to assume this therefore I'll recheck the co-pay an order for next week. 08/20/17; the patient agreed to accept the co-pay of the first Apligraf which we applied today. She is disappointed she is finishing hyperbarics will run this through the insurance on the extent of  the foot infection and the extent of the wound that she had however she is already had 60 dive's. Dermagraft No. 1 08/27/17; Dermagraft No. 2. She is not eligible for any more hyperbaric treatments this month. She reports a fair amount of drainage and she actually changed to the external dressings without disturbing the direct contact layer 09/03/17; the patient arrived in clinic today with the wound superficially looking quite healthy. Nice vibrant red tissue with some advancing epithelialization although not as much adherence of the flap as I might like. However she noted on her own fourth toe some bogginess and she brought that to our attention. Indeed this was boggy feeling like a possibility of subcutaneous fluid. 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 She is not been unwell. We've been using Dermagraft 09/10/17; the culture that I did not last week was MRSA. She saw Dr. Megan Salon this morning who is going to start her on vancomycin. I had sent him a secure a text message yesterday. I also spoke with her podiatric surgeon [name] about surgery on this foot the options for conserving a functional foot etc. Promised me he would see her and will make back consultation today. Paradoxically her actual wound on the plantar aspect of her left foot looks really quite good. I had given her 5 days worth of Baxdella to cover her for MRSA. Her MRI came back showing osteomyelitis within the third metatarsal shaft and head and base of the third and fourth proximal phalanx. She had extensive inflammatory changes throughout the soft tissue of the lateral forefoot. With an ill-defined fluid around the fourth metatarsal extending into the plantar and dorsal soft tissues 09/19/17; the patient is actually on oral Septra and Flagyl. She apparently refused IV vancomycin. She also saw [name] at my request who is planning her for a left BKA  sometime in mid July. MRI showed osteomyelitis within the third metatarsal shaft and head and the basis of the third and fourth proximal phalanx. I believe there was felt to be possible septic arthritis involving the third MTP. 09/26/17; the patient went back to Dr. Megan Salon at my suggestion and is now receiving IV daptomycin. Her wound continues to look quite good making the decision to proceed with a transmetatarsal amputation although more difficult for the patient. I believe in my extensive discussions with her she has a good sense of the pros and cons of this. I don't NV the tuft decision she has to make. She has an appointment with [name] I believe in mid July and I previously spoken to him about this issue Has we had used 3 previous Dermagraft. Given the condition of the wound surface I went ahead and added the fourth one today area and I did this not fully realizing that she'll be traveling to [place] next week. I'm hopeful she can come back in 2 weeks 10/21/17; Her same Dermagraft on for about 3-1/2 weeks. In spite of this the wound arrives looking quite healthy. There is been a lot of healing dimensions are smaller. Looking at the square shaped wound she has now there is some undermining and some depth medially under the undermining although I cannot palpate any bone. No surrounding infection is obvious. She has difficult questions about how to look at this going forward vis--vis amputations versus continued medical therapy. To be truthful the wound is looks so healthy and it is continued to contract. Hard to justify foot surgery at this point although I still told her that I think it might come to that if we are not able to eradicate the underlying MRSA. She is still highly at risk and she understands this 11/06/17 on evaluation today patient appears to be doing better in regard to her foot ulcer. She's been tolerating the dressing changes without complication. Currently she is here  for her Dermagraft #6. Her wound continues to make excellent progress at this point. She does not appear to have any evidence of infection which is good news. 11/13/17 on evaluation today patient appears to be doing excellent at this time. She is here for repeat Dermagraft application. This is #7. Overall her wound seems to be making great progress. 12/05/17; the patient arrives with the wound in much better condition than when I last saw this almost 6 weeks ago. She still has a small probing area in the left metatarsal head region on the lateral aspect of her foot. We applied her last Dermagraft today. ooSince the last time she is here she has what appears to a been a blood blister on the plantar aspect of left foot although I don't see this is threatening. There is also a thick raised tissue on the right mid metatarsal head region. This was not there I don't think the last time she was here 3 weeks ago. 12/12/17; the patient continues to have a small programming area in the left metatarsal head region on the lateral aspect of her foot which was the initial large surgical wound. I applied her last Apligraf last week. I'm going to use Endoform starting today ooUnfortunately she has an excoriated area in the left mid foot and the right mid foot. The left midfoot looks like a blistered area this was not opened last week it certainly is open today. Using silver alginate on these areas. She promises me she is offloading this. 12/19/17;  the small probing area in the left metatarsal head eyes think is shallower. In general her original wound looks better. We've been using Endoform. The area inferiorly that I think was trauma last week still requires debridement a lot of nonviable surface which I removed. She still has an open open area distally in her foot ooSimilarly on the right foot there is tightly adherent surface debris which I removed. Still areas that don't look completely epithelialized. This is  a small open area. We used silver alginate on these areas 12/26/2017; the patient did not have the supplies we ordered from last week including the Endoform. The original large wound on the left lateral foot looks healthy. She still has the undermining area that is largely unchanged from last week. She has the same heavily callused raised edged wounds on the right mid and left midfoot. Both of these requiring debridement. We have been using silver alginate on these areas 01/02/2018; there is still supply issues. We are going to try to use Prisma but I am not sure she actually got it from what she is saying. She has a new open area on the lateral aspect of the left fourth toe [previous fifth ray amputation]. Still the one tunneling area over the fourth metatarsal head. The area is in the midfoot bilaterally still have thick callus around them. She is concerned about a raised swelling on the lateral aspect of the foot. However she is completely insensate 01/10/2018; we are using Prisma to the wounds on her bilateral feet. Surprisingly the tunneling area over the left fourth metatarsal head that was part of her original surgery has closed down. She has a small open area remaining on the incision line. 2 open areas in the midfoot. 02/10/2018; the patient arrives back in clinic after a month hiatus. She was traveling to visit family in [place]. Is fairly clear she was not offloading the areas on her feet. The original wound over the left lateral foot at the level of metatarsal heads is reopened and probes medially by about a centimeter or 2. She notes that a week ago she had purulent drainage come out of an area on the left midfoot. Paradoxically the worst area is actually on the right foot is extensive with purulent drainage. We will use silver alginate today 02/17/2018; the patient has 3 wounds one over the left lateral foot. She still has a small area over the metatarsal heads which is the remnant of  her original surgical wound. This has medial probing depth of roughly 1.4 cm somewhat better than last week. The area on the right foot is larger. We have been using silver alginate to all areas. The area on the right foot and left foot that we cultured last week showed both Klebsiella and Proteus. Both of these are quinolone sensitive. The patient put her's self on Bactrim and Flagyl that she had left hanging around from prior antibiotic usages. She was apparently on this last week when she arrived. I did not realize this. Unfortunately the Bactrim will not cover either 1 of these organisms. We will send in Cipro 500 twice daily for a week 03/04/2018; the patient has 2 wounds on the left foot one is the original wound which was a surgical wound for a deep DFU. At one point this had exposed bone. She still has an area over the fourth metatarsal head that probes about 1.4 cm although I think this is better than last week. I been using silver nitrate to  try and promote tissue adherence and been using silver alginate here. ooShe also has an area in the left midfoot. This has some depth but a small linear wound. Still requiring debridement. ooOn the right midfoot is a circular wound. A lot of thick callus around this area. ooWe have been using silver alginate to all wound areas ooShe is completed the ciprofloxacin I gave her 2 weeks ago. 03/11/2018; the patient continues to have 2 open areas on the left foot 1 of which was the original surgical wound for a deep DFU. Only a small probing area remains although this is not much different from last week we have been using silver alginate. The other area is on the midfoot this is smaller linear but still with some depth. We have been using silver alginate here as well ooOn the right foot she has a small circular wound in the mid aspect. This is not much smaller than last time. We have been using silver alginate here as well 03/18/2018; she has 3  wounds on the left foot the original surgical wound, a very superficial wound in the mid aspect and then finally the area in the mid plantar foot. She arrives in today with a very concerning area in the wound in the mid plantar foot which is her most proximal wound. There is undermining here of roughly 1-1/2 cm superiorly. Serosanguineous drainage. She tells me she had some pain on for over the weekend that shot up her foot into her thigh and she tells me that she had a nodule in the groin area. ooShe has the single wound in the right foot. ooWe are using endoform to both wound areas 03/24/2018; the patient arrives with the original surgical wound in the area on the left midfoot about the same as last week. There is a collection of fluid under the surface of the skin extending from the surgical wound towards the midfoot although it does not reach the midfoot wound. The area on the right foot is about the same. Cultures from last week of the left midfoot wound showed abundant Klebsiella abundant Enterococcus faecalis and moderate methicillin resistant staph I gave her Levaquin but this would have only covered the Klebsiella. She will need linezolid 04/01/2018; she is taking linezolid but for the first few days only took 1 a day. I have advised her to finish this at twice daily dosing. In any case all of her wounds are a lot better especially on the left foot. The original surgical wound is closed. The area on the left midfoot considerably smaller. The area on the right foot also smaller. 04/08/2018; her original surgical wound/osteomyelitis on the left foot remains closed. She has area on the left foot that is in the midfoot area but she had some streaking towards this. This is not connected with her original wound at least not visually. ooSmall wound on the right midfoot appears somewhat smaller. 04/15/18; both wounds looks better. Original wound is better left midfoot. Using silver  alginate 1/21; patient states she uses saltwater soak in, stones or remove callus from around her wounds. She is also concerned about a blood blister she had on the left foot but it simply resolved on its own. We've been using silver alginate 1/28; the patient arrives today with the same streaking area from her metatarsals laterally [the site of her original surgical wound] down to the middle of her foot. There is some drainage in the subcutaneous area here. This concerns me that there is  actually continued ongoing infection in the metatarsals probably the fourth and third. This fixates an MRI of the foot without contrast [chronic renal failure] ooThe wound in the mid part of the foot is small but I wonder whether this area actually connects with the more distal foot. ooThe area on the right midfoot is probably about the same. Callus thick skin around the small wound which I removed with a curette we have been using silver alginate on both wound areas 2/4; culture I did of the draining site on the left foot last time grew methicillin sensitive staph aureus. MRI of the left foot showed interval resolution of the findings surrounding the third metatarsal joint on the prior study consistent with treated osteomyelitis. Chronic soft tissue ulceration in the plantar and lateral aspect of the forefoot without residual focal fluid collection. No evidence of recurrent osteomyelitis. 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 She tells me she had nausea and vomiting over the weekend and missed 2 days. In general her foot looks much the same. She has a small open area just below the left fourth metatarsal head. A linear area in the left midfoot. Some discoloration  extending from the inferior part of this into the left lateral foot although this appears to be superficial. She has a small area on the right midfoot which generally looks smaller after debridement 2/18; the patient is completing his cephalexin and has another 2 days. She continues to have open areas on the left and right foot. 2/25; she is now off antibiotics. The area on the left foot at the site of her original surgical wound has closed yet again. She still has open areas in the mid part of her foot however these appear smaller. The area on the right mid foot looks about the same. We have been using silver alginate She tells me she had a serious hypoglycemic spell at home. She had to have EMS called and get IV dextrose 3/3; disappointing on the left lateral foot large area of necrotic tissue surrounding the linear area. This appears to track up towards the same original surgical wound. Required extensive debridement. The area on the right plantar foot is not a lot better also using silver 3/12; the culture I did last time showed abundant enterococcus. I have prescribed Augmentin, should cover any unrecognized anaerobes as well. In addition there were a few MRSA and Serratia that would not be well covered although I did not want to give her multiple antibiotics. She comes in today with a new wound in the right midfoot this is not connected with the original wound over her MTP a lot of thick callus tissue around both wounds but once again she said she is not walking on these areas 3/17-Patient comes in for follow-up on the bilateral plantar wounds, the right midfoot and the left plantar wound. Both these are heavily callused surrounding the wounds. We are continuing to use silver alginate, she is compliant with offloading and states she uses a wheelchair fairly often at home 3/24; both wound areas have thick callus. However things actually look quite a bit better here for the majority of  her left foot and the right foot. 3/31; patient continues to have thick callused somewhat irritated looking tissue  around the wounds which individually are fairly superficial. There is no evidence of surrounding infection. We have been using silver alginate however I change that to [facility] today 4/17; patient returns to clinic after having a scare with Covid she tested negative in her primary doctor's office. She has been using Hydrofera Blue. She does not have an open area on the right foot. On the left foot she has a small open area with the mid area not completely viable. She showed me pictures of what looks like a hemorrhagic blister from several days ago but that seems to have healed over this was on the lateral left foot 4/21; patient comes in to clinic with both her wounds on her feet closed. However over the weekend she started having pain in her right foot and leg up into the thigh. She felt as though she was running a low-grade fever but did not take her temperature. She took a doxycycline that she had leftover and yesterday a single Septra and metronidazole. She thinks things feel somewhat better. 4/28; duplex ultrasound I ordered last week was negative for DVT or superficial thrombophlebitis. She is completed the doxycycline I gave her. States she is still having a lot of pain in the right calf and right ankle which is no better than last week. She cannot sleep. She also states she has a temperature of up to 101, coughing and complaining of visual loss in her bilateral eyes. Apparently she was tested for Covid 2 weeks ago at [facility] and that was negative. Readmission: 09/03/18 patient presents back for reevaluation after having been evaluated at the end of April regarding erythema and swelling of her right lower extremity. Subsequently she ended up going to the hospital on 07/29/18 and was admitted not to be discharged until 08/08/18. Unfortunately it was noted during the time that  she was in the hospital that she did have methicillin-resistant Staphylococcus aureus as the infection noted at the site. It was also determined that she did have osteomyelitis which appears to be fairly significant. She was treated with vancomycin and in fact is still on IV vancomycin at dialysis currently. This is actually slated to continue until 09/12/18 at least which will be the completion of the six weeks of therapy. Nonetheless based on what I'm seeing at this point I'm not sure she will be anywhere near ready to discontinue antibiotics at that time. Since she was released from the hospital she was seen by [name] who is her podiatrist on 08/27/18. His note specifically states that he is recommended that the patient needs of one knee amputation on the right as she has a life-threatening situation that can lead quickly to sepsis. The patient advised she would like to try to save her leg to which [name] apparently told her that this was against all medical advice. She also want to discontinue the Wound VAC which had been initiated due to the fact that she wasn't pleased with how the wound was looking and subsequently she wanted to pursue applying Medihoney at that time. He stated that he did not believe that the right lower extremity was salvageable and that the patient understood but would still like to attempt hyperbaric option therapy if it could be of any benefit. She was therefore referred back to [place] for further evaluation. He plans to see her back next week. Upon inspection today patient has a significant amount purulent drainage noted from the wound at this point. The bone in the distal  portion of her foot also appears to be extremely necrotic and spongy. When I push down on the bone it bubbles and seeps purulent drainage from deeper in the end of the foot. I do not think that this is likely going to heal very well at all and less aggressive surgical debridement were undertaken more  than what I believe we can likely do here in our office. 09/12/2018; I have not seen this patient since the most recent hospitalization although she was in our clinic last week. I have reviewed some of her records from a complex hospitalization. 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 There is situation was complicated by MRSA bacteremia and acute on chronic renal failure now on dialysis. She is receiving vancomycin at dialysis. We started her on Dakin's wet-to- dry last week she is changing this daily. There is still purulent drainage coming out of her foot. Although she is apparently "agreeable" to a below-knee amputation which is been suggested by multiple clinicians she wants this to be done in [place]. She apparently has a telehealth visit with that provider sometime in late [place] 6/24. I have told her I think this is probably too long. Nevertheless I could not convince her to allow a local doctor to perform BKA. 09/19/2018; the patient has a large necrotic area on the right anterior foot. She has had previous transmetatarsal amputations. Culture I did last week showed MRSA nothing else she is on vancomycin at dialysis. She has continued leaking purulent drainage out of the distal part of the large circular wound on the right anterior foot. She apparently went to see Dr. 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 Somehow that translated into her being referred to plastic surgery for debridement of the area. I gather she basically refused amputation although I do not have a copy of Dr. Berenice Primas notes. The patient really wants to have a trial of hyperbaric oxygen. I agreed with initial assessment in this clinic that this was probably too far along to benefit however if she is going to have plastic surgery I think she would benefit from ancillary hyperbaric oxygen. The issue here is that the patient has benefited as  maximally as any patient I have ever seen from hyperbaric oxygen therapy. Most recently she had exposed bone on the lateral part of her left foot after a surgical procedure and that actually has closed. She has eschared areas in both heels but no open area. She is remained systemically well. I am not optimistic that anything can be done about this but the patient is very clear that she wants an attempt. The attempt would include a wound VAC further debridements and hyperbaric oxygen along with IV antibiotics. 6/26; I put her in for a trial of hyperbaric oxygen only because of the dramatic response she has had with wounds on her left midfoot earlier this year which was a surgical wound that went straight to her bone over the metatarsal heads and also remotely the left third toe. We will see if we can get this through our review process and insurance. She arrives in clinic with again purulent material pouring out of necrotic bone on the top of the foot distally. There is also some concerning erythema on the front of the leg that we marked. It is bit difficult to tell how tender this is because of neuropathy. I note from infectious disease that she had her vancomycin extended. All the  cultures of these areas have shown MRSA sensitive to vancomycin. She had the wound VAC on for part of the week. The rest of the time she is putting various things on this including Medihoney, "ionized water" silver sorb gel etc. 7/7; follow-up along with HBO. She is still on vancomycin at dialysis. She has a large open area on the dorsal right foot and a small dark eschar area on her heel. There is a lot less erythema in the area and a lot less tenderness. From an infection point of view I think this is better. She still has a lot of necrosis in the remaining right forefoot [previous TMA] we are still using the wound VAC in this area 7/16; follow-up along with HBO. I put her on linezolid after she finished her  vancomycin. We started this last Friday I gave her 2 weeks worth. I had the expectation that she would be operatively debrided by Dr. Marla Roe but that still has not happened yet. Patient phoned the office this week. She arrives for review today after HBO. The distal part of this wound is completely necrotic. Nonviable pieces of tendon bone was still purulent drainage. Also concerning that she has black eschar over the heel that is expanding. I think this may be indicative of infection in this area as well. She has less erythema and warmth in the ankle and calf but still an abnormal exam 7/21 follow-up along with HBO. I will renew her linezolid after checking a CBC with differential monitoring her blood counts especially her platelets. She was supposed to have surgery yesterday but if I am reading things correctly this was canceled after her blood sugar was found to be over 500. I thought Dr. Marla Roe who called me said that they were sending her to the ER but the patient states that was not the case. 7/28. Follow-up along with HBO. She is on linezolid I still do not have any lab work from dialysis even though I called last week. The patient is concerned about an area on her left lateral foot about the level of the base of her fifth metatarsal. I did not really see anything that ominous here however this patient is in [place] ability to point out problems that she is sensing and she has been accurate in the past Finally she received a call from Dr. Marla Roe who is referring her to another orthopedic surgeon stating that she is too booked up to take her to the operating room now. Was still using a wound VAC on the foot 8/3 -Follow-up after HBO, she is got another week of linezolid, she is to call [facility] for an appointment, x-rays of both feet were reviewed, the left foot x-ray with third MTP joint osteo- Right foot x-ray widespread osteo-in the right midfoot Right ankle x-ray does not show any  active evidence of infection 8/11-Patient is seen after HBO, the wounds on the right foot appear to be about the same, the heel wound had some necrotic base over tendon that was debrided with a curette 8/21; patient is seen after HBO. The patient's wound on her dorsal foot actually looks reasonably good and there is substantial amount of epithelialization however the open area distally still has a lot of necrotic debris partially bone. I cannot really get a good sense of just how deep this probes under the foot. She has been pressuring me this week to order medical maggots through a company in [place] for her. The problem I have is there  is not a defined wound area here. On the positive side there is no purulence. She has been to see infectious disease she is still on Septra DS although I have not had a chance to review their notes 8/28; patient is seen in conjunction with HBO. The wounds on her foot continued to improve including the right dorsal foot substantially the, the distal part of this wound and the area on the right heel. We have been using a wound VAC over this chronically. She is still on trimethoprim as directed by infectious disease 9/4; patient is seen in conjunction with HBO. Right dorsal foot wound substantially anteriorly is better however she continues to have a deep wound in the distal part of this that is not responding. We have been using silver collagen under border foam ooArea on the right plantar medial heel seems better. We have been using Hydrofera Blue 12/12/18 on evaluation today patient appears to be doing about the same with regard to her wound based on prior measurements. She does have some necrotic tissue noted on the lateral aspect of the wound that is going require a little bit of sharp debridement today. This includes what appears to be potentially either severely necrotic bone or tendon. Nonetheless other than that she does not appear to have any severe  infection which is good news 9/18; it is been 2 weeks since I saw this wound. She is tolerating HBO well. Continued dramatic improvement in the area on the right dorsal foot. She still has a small wound on the heel that we have been using Hydrofera Blue. She continues with a wound VAC 9/24; patient has to be seen emergently today with a swelling on her right lateral lower leg. She says that she told Dr. Evette Doffing about this and also myself on a couple of occasions but I really have no recollection of this. She is not systemically unwell and her wound really looked good the last time I saw this. She showed this to providers at dialysis and she was able to verify that she was started on cephalexin today for 5 doses at dialysis. She dialyzes on Tuesday Thursday and Saturday. 10/2; patient is seen in conjunction with HBO. The area that is draining on the right anterior medial tibia is more extensive. Copious amounts of serosanguineous drainage with some purulence. We are still using the wound VAC on the original wound then it is stable. Culture I did of the original IandD showed MRSA I contacted dialysis she is now on vancomycin with dialysis treatments. I asked them to run a month 10/9; patient seen in conjunction with HBO. She had a new spontaneous open area just above the wound on the right medial tibia ankle. More swelling on the right medial tibia. Her wound on the foot looks about the same perhaps slightly better. There is no warmth spreading up her leg but no obvious erythema. her MRI of the foot and ankle and distal tib-fib is not booked for next Friday I discussed this with her in great detail over multiple days. it is likely she has spreading infection upper leg at least involving the distal 25% above the ankle. She knows that if I refer her to orthopedics for infectious disease they are going to recommend amputation and indeed I am not against this myself. We had a good trial at trying to  heal the foot which is what she wanted along with antibiotics debridement and HBO however she clearly has spreading infection [probably staph aureus/MRSA].  Nevertheless she once again tells me she wants to wait the left of the MRI. She still makes comments about having her amputation done in [place]. 10/19; arrives today with significant swelling on the lateral right leg. Last culture I did showed Klebsiella. Multidrug- resistant. Cipro was intermediate sensitivity and that is what I have her on pending her MRI which apparently is going to be done on Thursday this week although this seems to be moving back and forth. She is not systemically unwell. We are using silver alginate on her major wound area on the right medial foot and the draining areas on the right lateral lower leg 10/26; MRI showed extensive abscess in the anterior compartment of the right leg also widespread osteomyelitis involving osseous structures of the midfoot and portions of the hindfoot. Also suspicion for osteomyelitis anterior aspect of the distal medial malleolus. Culture I did of the purulence once again showed a multidrug-resistant Klebsiella. I have been in contact with nephrology late last week and she has been started on cefepime at dialysis to replace the vancomycin We sent a copy of her MRI report to Dr. Geroge Baseman in [place] who is an orthopedic surgeon. The patient takes great stock in his opinion on this. She says she will go to [place] to have her leg amputated if Dr. Geroge Baseman does not feel there is any salvage options. 11/2; she still is not talk to her orthopedic surgeon in [place]. Apparently he will call her at [phone] this afternoon. The quality of this is she has not allowed me to refer her anywhere. She has been told over and over that she needs this amputated but has not agreed to be referred. She tells me her blood sugar was 600 last night but she has not been febrile. 11/9; she never did got a  call from the orthopedic surgeon in [place] therefore that is off the radar. We have arranged to get her see orthopedic surgery at [facility]. She still has a lot of draining purulence coming out of the new abscess in her right leg although that probably came from the osteomyelitis in her right foot and heel. Meanwhile the original wound on the right foot looks very healthy. Continued improvement. The issue is that the last MRI showed osteomyelitis in her right foot extensively she now has an abscess in the right anterior lower leg. There is nobody in [place] who will offer this woman anything but an amputation and to be honest that is probably what she needs. I think she still wants to talk about limb salvage although at this point I just do not see that. She has completed her vancomycin at dialysis which was for the original staph aureus she is still on cefepime for the more recent Klebsiella. She has had a long course of both of these antibiotics which should have benefited the osteomyelitis on the right foot as well as the abscess. 11/16; apparently [place] elective surgery is shut down because of COVID-19 pandemic. I have reached out to some contacts at [facility] to see if we can get her an orthopedic appointment there. I am concerned about continually leaving this but for the moment everything is static. In fact her original large wound on this foot is closing down. It is the abscess on the right anterior leg that continues to drain purulent serosanguineous material. She is not currently on any antibiotics however she had a prolonged course of vancomycin [1 month] as well as cefepime for a month 02/24/2019 on evaluation today  patient appears to be doing better than the last time I saw her. This is not a patient that I typically see. With that being said I am covering for Dr. Dellia Nims this week and again compared to when I last saw her overall the wounds in particular seem to be doing  significantly better which is good news. With that being said the patient tells me several disconcerting things. She has not been able to get in to see anyone for potential debridement in regard to her leg wounds although she tells me that she does not think it is necessary any longer because she is taking care of that herself. She noticed a string coming out of the lower wound on her leg over the last week. The patient states that she subsequently decided that we must of pack something in there and started pulling the string out and as it kept coming and coming she realized this was likely her tendon. With that being said she continued to remove as much of this as she could. She then I subsequently proceeded to using tubes of antibiotic ointment which she will stick down into the wound and then scored as much as she can until she sees it coming out of the other wound opening. She states that in doing this she is actually made things better and there is less redness and irritation. With regard to her foot wound she does have some necrotic tendon and tissue noted in one small corner but again the actual wound itself seems to be doing better with good granulation in general compared to my last evaluation. 12/7; continued improvement in the wound on the substantial part of the right medial foot. Still a necrotic area inferiorly that required debridement but the rest of this looks very healthy and is contracting. She has 2 wounds on the right lateral leg which were her original drainage sites from her abscess but all of this looks a lot better as well. She has been using silver alginate after putting antibiotic biotic ointment in one wound and watching it come out the other. I have talked to her in some detail today. I had given her names of orthopedic surgeons at [facility] for second opinion on what to do about the right leg. I do not think the patient never called them. She has not been able to get a  hold of the orthopedic surgeon in [place] that she had put a lot of faith in as being somebody would give her an opinion that she would trust. I talked to her today and said even if I could get her in to another orthopedic surgeon about the leg which she accept an amputation and she said she would not therefore I am not going to press this issue for the moment 12/14; continued improvement in his substantial wound on the right medial foot. There is still a necrotic area inferiorly with tightly adherent necrotic debris which I have been working on debriding each time she is here. She does not have an orthopedic appointment. Since last time she was here I looked over her cultures which were essentially MRSA on the foot wound and gram- negative rods in the abscess on the anterior leg. 12/21; continued improvement in the area on the right medial foot. She is not up on this much and that is probably a good thing since I do not know it could support continuous ambulation. She has a small area on the right lateral leg which were remanence  of the IandD's I did because of the abscess. I think she should probably have prophylactic antibiotics I am going to have to look this over to see if we can make an intelligent decision here. In the meantime her major wound is come down nicely. Necrotic area inferiorly is still there but looks a lot better 04/06/2019; she has had some improvement in the overall surface area on the right medial foot somewhat narrowedr both but somewhat longer. The areas on the right lateral leg which were initial IandD sites are superficial. Nothing is present on the right heel. We are using silver alginate to the wound areas Objective Constitutional Patient is hypertensive.. Pulse regular and within target range for patient.Marland Kitchen Respirations regular, non-labored and within target range.. Temperature is normal and within the target range for the patient.Marland Kitchen Appears in no distress. Vitals  Time Taken: 10:26 AM, Height: 67 in, Weight: 125 lbs, BMI: 19.6, Temperature: 98.4 F, Pulse: 96 bpm, Respiratory Rate: 20 breaths/min, Blood Pressure: 153/72 mmHg, Capillary Blood Glucose: 280 mg/dl. Cardiovascular There is no clear evidence of reaccumulation of the fluid in the right lateral/anterior leg. General Notes: Wound exam; continued albeit slight improvement today. The wound on the medial foot appears to have changed configuration of it but generally I think there is been more epithelialization. Small necrotic area at the base of this wound I removed with pickups and a #15 scalpel. This is definitely improved at the base of this area appears to be healthy tissue Integumentary (Hair, Skin) Wound #43 status is Open. Original cause of wound was Gradually Appeared. The wound is located on the Right,Medial Foot. The wound measures 4.5cm length x 3cm width x 0.2cm depth; 10.603cm^2 area and 2.121cm^3 volume. There is muscle, tendon, and Fat Layer (Subcutaneous Tissue) Exposed exposed. There is no tunneling or undermining noted. There is a medium amount of serosanguineous drainage noted. The wound margin is well defined and not attached to the wound base. There is large (67-100%) red, pink granulation within the wound bed. There is a small (1-33%) amount of necrotic tissue within the wound bed including Adherent Slough and Necrosis of Muscle. Wound #47 status is Open. Original cause of wound was Gradually Appeared. The wound is located on the Right,Anterior Lower Leg. The wound measures 0.4cm length x 0.2cm width x 0.1cm depth; 0.063cm^2 area and 0.006cm^3 volume. There is Fat Layer (Subcutaneous Tissue) Exposed exposed. There is no tunneling or undermining noted. There is a medium amount of serosanguineous drainage noted. The wound margin is distinct with the outline attached to the wound base. There is large (67-100%) pink granulation within the wound bed. There is no necrotic tissue  within the wound bed. 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 There was a Excisional Skin/Subcutaneous Tissue Debridement with a total area of 1 sq cm performed by Ricard Dillon., MD. With the following instrument(s): Blade, and Forceps to remove Non-Viable tissue/material. Material removed includes Subcutaneous Tissue and Slough and. No specimens were taken. A time out was conducted at 11:26, prior to the start of the procedure. A Minimum amount of bleeding was controlled  with Pressure. The procedure was tolerated well with a pain level of 0 throughout and a pain level of 0 following the procedure. Post Debridement Measurements: 4.5cm length x 3cm width x 0.2cm depth; 2.121cm^3 volume. Character of Wound/Ulcer Post Debridement is improved. Severity of Tissue Post Debridement is: Fat layer exposed. Post procedure Diagnosis Wound #43: Same as Pre-Procedure Plan Follow-up Appointments: Return Appointment in 2 weeks. Dressing Change Frequency: Wound #43 Right,Medial Foot: Change dressing three times week. Wound #47 Right,Anterior Lower Leg: Change dressing three times week. Wound Cleansing: Clean wound with Wound Cleanser - Anasept to all wounds Primary Wound Dressing: Wound #43 Right,Medial Foot: Calcium Alginate with Silver Wound #47 Right,Anterior Lower Leg: Calcium Alginate with Silver Secondary Dressing: Wound #43 Right,Medial Foot: Kerlix/Rolled [name] ABD pad Wound #47 Right,Anterior Lower Leg: Kerlix/Rolled [name] ABD pad Edema  Control: Elevate legs to the level of the heart or above for 30 minutes daily and/or when sitting, a frequency of: - throughout the day Home Health: [facility] for wound care. - Interim The following medication(s) was prescribed: sulfamethoxazole-trimethoprim oral 800 mg-160 mg tablet 1 tablet oral daily for 30 days. after dialysis on dialysis days for prophylaxis osteo right foot starting 04/06/2019 1. I am continuing with silver alginate 2. After some thought and discussion with the patient I have elected to put her on a prophylactic Septra DS 1 p.o. daily after dialysis on dialysis days. 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 The abscess she had in the right leg was gram-negative rods but again largely sensitive to Septra. I do not usually prescribe prophylactic antibiotics leaving that to infectious disease however for many reasons she is looking to me for the answers here and she has not recently seen infectious disease who constantly recommended amputation along with the rest of the medical community here. The problem was that she refused this and continues to refuse Electronic Signature(s) Signed: 04/06/2019 4:56:38 PM By: Linton Ham MD Entered By: Linton Ham on 04/06/2019 11:50:57 -------------------------------------------------------------------------------- SuperBill Details Patient Name: Date of Service: [name] 04/06/2019 Medical Record Number:[id number] Patient Account Number: [id number] Date of Birth/Sex: Treating RN: 02-13-1971 (49 y.o. F) Primary Care Provider: [name] Other Clinician: Referring Provider: Treating Provider/Extender:Brack Shaddock, [name], [name] in Treatment: 30 Diagnosis Coding ICD-10 Codes Code Description [phone] Other chronic osteomyelitis, right ankle and foot L97.514 Non-pressure chronic ulcer of other part of right  foot with necrosis of bone E10.621 Type 1 diabetes mellitus with foot ulcer L97.812 Non-pressure chronic ulcer of other part of right lower leg with fat layer exposed Facility Procedures CPT4 Code Description: [id number] 11042 - DEB SUBQ TISSUE 20 SQ CM/< ICD-10 Diagnosis Description L97.514 Non-pressure chronic ulcer of other part of right foot wit Modifier: h necrosis of Quantity: 1 bone Physician Procedures CPT4 Code Description: [id number] 11042 - WC PHYS SUBQ TISS 20 SQ CM ICD-10 Diagnosis Description L97.514 Non-pressure chronic ulcer of other part of right foot wit Modifier: h necrosis of Quantity: 1 bone Electronic Signature(s) Signed: 04/06/2019 4:56:38 PM By: Linton Ham MD Entered By: Linton Ham on 04/06/2019 11:51:20

## 2019-04-06 NOTE — Progress Notes (Addendum)
[name], [name] ([id number]) Visit Report for 04/06/2019 Arrival Information Details Patient Name: Date of Service: [name], [name] 04/06/2019 10:00 AM Medical Record Number:[id number] Patient Account Number: [id number] Date of Birth/Sex: Treating RN: April 05, 1970 (49 y.o. [name], [name] Primary Care : [name] Other Clinician: Referring : Treating /Extender:[name], [name], [name] in Treatment: 30 Visit Information History Since Last Visit Added or deleted any medications: No Patient Arrived: Wheel Chair Any new allergies or adverse reactions: No Arrival Time: 10:25 Had a fall or experienced change in No activities of daily living that may affect Accompanied By: self risk of falls: Transfer Assistance: None Signs or symptoms of abuse/neglect since last No Patient Identification Verified: Yes visito Secondary Verification Process Completed: Yes Hospitalized since last visit: No Patient Requires Transmission-Based No Implantable device outside of the clinic excluding No Precautions: cellular tissue based products placed in the center Patient Has Alerts: No since last visit: Has Dressing in Place as Prescribed: Yes Pain Present Now: No Electronic Signature(s) Signed: 04/06/2019 4:44:32 PM By: Deon Pilling Entered By: Deon Pilling on 04/06/2019 10:25:54 -------------------------------------------------------------------------------- Encounter Discharge Information Details Patient Name: Date of Service: [name] 04/06/2019 10:00 AM Medical Record Number:[id number] Patient Account Number: [id number] Date of Birth/Sex: Treating RN: 1970/10/27 (49 y.o. [name] Primary Care : [name] Other Clinician: Referring : Treating /Extender:[name], [name], [name] in Treatment: 30 Encounter Discharge Information Items Post Procedure Vitals Discharge Condition:  Stable Temperature (F): 98.4 Ambulatory Status: Wheelchair Pulse (bpm): 96 Discharge Destination: Home Respiratory Rate (breaths/min): 20 Transportation: Private Auto Blood Pressure (mmHg): 153/72 Accompanied By: self Schedule Follow-up Appointment: Yes Clinical Summary of Care: Electronic Signature(s) Signed: 04/06/2019 4:44:32 PM By: Deon Pilling Entered By: Deon Pilling on 04/06/2019 11:38:56 -------------------------------------------------------------------------------- Lower Extremity Assessment Details Patient Name: Date of Service: [name], [name] 04/06/2019 10:00 AM Medical Record Number:[id number] Patient Account Number: [id number] Date of Birth/Sex: Treating RN: [birth year] (49 y.o. [name] Primary Care : [name] Other Clinician: Referring : Treating /Extender:[name], [name], [name] in Treatment: 30 Edema Assessment Assessed: [Left: No] [Right: No] Edema: [Left: Ye] [Right: s] Calf Left: Right: Point of Measurement: cm From Medial Instep cm 27 cm Ankle Left: Right: Point of Measurement: cm From Medial Instep cm 19.5 cm Electronic Signature(s) Signed: 04/06/2019 4:44:32 PM By: Deon Pilling Entered By: Deon Pilling on 04/06/2019 10:26:42 -------------------------------------------------------------------------------- Multi Wound Chart Details Patient Name: Date of Service: [name] 04/06/2019 10:00 AM Medical Record Number:[id number] Patient Account Number: [id number] Date of Birth/Sex: Treating RN: 12/16/1970 (49 y.o. F) Primary Care : [name] Other Clinician: Referring : Treating /Extender:[name], [name], [name] in Treatment: 30 Vital Signs Height(in): 67 Capillary Blood Height(in): 67 Capillary Blood 280 Glucose(mg/dl): Weight(lbs): 125 Pulse(bpm): 41 Body Mass Index(BMI): 20 Blood Pressure(mmHg): 153/72 Temperature(F):  98.4 Respiratory 20 Rate(breaths/min): Photos: [43:No Photos] [47:No Photos] [N/A:N/A] Wound Location: [43:Right Foot - Medial] [47:Right Lower Leg - Anterior N/A] Wounding Event: [43:Gradually Appeared] [47:Gradually Appeared] [N/A:N/A] Primary Etiology: [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] [N/A:N/A] Comorbid History: [43:Cataracts, Chronic sinus Cataracts, Chronic sinus N/A problems/congestion, Anemia, Sleep Apnea, Deep Anemia, Sleep Apnea, Deep Vein Thrombosis, Hypertension, Peripheral Hypertension, Peripheral Arterial Disease, Type I Diabetes,  Osteoarthritis, Osteomyelitis, Neuropathy, Osteomyelitis, Neuropathy, Seizure Disorder] [47:problems/congestion, Vein Thrombosis, Arterial Disease, Type I Diabetes, Osteoarthritis, Seizure Disorder] Date Acquired: [43:08/04/2018] [47:01/31/2019] [N/A:N/A] Weeks of Treatment: [43:30] [47:9] [N/A:N/A] Wound Status: [43:Open] [47:Open] [N/A:N/A] Clustered Wound: [43:Yes] [47:No] [N/A:N/A] Clustered Quantity: [43:3] [47:N/A] [N/A:N/A] Measurements L x W x D 4.5x3x0.2 [  47:0.4x0.2x0.1] [N/A:N/A] (cm) Area (cm) : [43:10.603] [47:0.063] [N/A:N/A] Volume (cm) : [43:2.121] [47:0.006] [N/A:N/A] % Reduction in Area: [43:85.40%] [47:85.70%] [N/A:N/A] % Reduction in Volume: 90.30% [47:86.40%] [N/A:N/A] Classification: [43:Grade 4] [47:Full Thickness Without Exposed Support Structures] [N/A:N/A] Exudate Amount: [43:Medium] [47:Medium] [N/A:N/A] Exudate Type: [43:Serosanguineous] [47:Serosanguineous] [N/A:N/A] Exudate Color: [43:red, brown] [47:red, brown] [N/A:N/A] Wound Margin: [43:Well defined, not attached Distinct, outline attached N/A] Granulation Amount: [43:Large (67-100%)] [47:Large (67-100%)] [N/A:N/A] Granulation Quality: [43:Red, Pink] [47:Pink] [N/A:N/A] Necrotic Amount: [43:Small (1-33%)] [47:None Present (0%)] [N/A:N/A] Exposed Structures: [43:Fat Layer (Subcutaneous Fat Layer (Subcutaneous N/A Tissue) Exposed: Yes Tendon:  Yes Muscle: Yes Fascia: No Joint: No Bone: No] [47:Tissue) Exposed: Yes Fascia: No Tendon: No Muscle: No Joint: No Bone: No] Epithelialization: [43:Medium (34-66%)] [47:Large (67-100%)] [N/A:N/A] Debridement: [43:Debridement - Excisional N/A] [N/A:N/A] Pre-procedure [43:11:26] [47:N/A] [N/A:N/A] Verification/Time Out Taken: Tissue Debrided: [43:Subcutaneous, Slough] [47:N/A] [N/A:N/A] Level: [43:Skin/Subcutaneous Tissue N/A] [N/A:N/A] Debridement Area (sq cm):1 [47:N/A] [N/A:N/A] Instrument: [43:Blade, Forceps] [47:N/A] [N/A:N/A] Bleeding: [43:Minimum] [47:N/A] [N/A:N/A] Hemostasis Achieved: [43:Pressure] [47:N/A] [N/A:N/A] Procedural Pain: [43:0] [47:N/A] [N/A:N/A] Post Procedural Pain: [43:0] [47:N/A] [N/A:N/A] Debridement Treatment [43:Procedure was tolerated] [47:N/A] [N/A:N/A] Response: [43:well] Post Debridement [43:4.5x3x0.2] [47:N/A] [N/A:N/A] Measurements L x W x D (cm) Post Debridement [43:2.121] [47:N/A] [N/A:N/A] Volume: (cm) Procedures Performed: [43:Debridement] [47:N/A] [N/A:N/A] Treatment Notes Wound #43 (Right, Medial Foot) 1. Cleanse With Wound Cleanser 3. Primary Dressing Applied Calcium Alginate Ag 4. Secondary Dressing Dry [name] Roll [name] 5. Secured With Tape Wound #47 (Right, Anterior Lower Leg) 1. Cleanse With Wound Cleanser 3. Primary Dressing Applied Calcium Alginate Ag 4. Secondary Dressing Dry [name] Roll [name] 5. Secured With Recruitment consultant) Signed: 04/06/2019 4:56:38 PM By: Linton Ham MD Entered By: Linton Ham on 04/06/2019 11:44:51 -------------------------------------------------------------------------------- Multi-Disciplinary Care Plan Details Patient Name: Date of Service: [name] 04/06/2019 10:00 AM Medical Record Number:[id number] Patient Account Number: [id number] Date of Birth/Sex: Treating RN: [birth year] (49 y.o. [name] Primary Care Analys Ryden: [name] Other  Clinician: Referring Brayah Urquilla: Treating Cote Mayabb/Extender:[name], [name], [name] in Treatment: 30 Active Inactive Nutrition Nursing Diagnoses: Imbalanced nutrition Impaired glucose control: actual or potential Potential for alteratiion in Nutrition/Potential for imbalanced nutrition Goals: Patient/caregiver agrees to and verbalizes understanding of need to use nutritional supplements and/or vitamins as prescribed Date Initiated: 09/03/2018 Date Inactivated: 10/07/2018 Target Resolution Date: 10/01/2018 Goal Status: Met Patient/caregiver will maintain therapeutic glucose control Date Initiated: 09/03/2018 Target Resolution Date: 05/08/2019 Goal Status: Active Interventions: Provide education on elevated blood sugars and impact on wound healing Treatment Activities: Patient referred to Primary Care Physician for further nutritional evaluation : 09/03/2018 Notes: Wound/Skin Impairment Nursing Diagnoses: Impaired tissue integrity Knowledge deficit related to ulceration/compromised skin integrity Goals: Patient/caregiver will verbalize understanding of skin care regimen Date Initiated: 09/03/2018 Target Resolution Date: 05/08/2019 Goal Status: Active Ulcer/skin breakdown will have a volume reduction of 30% by week 4 Date Initiated: 09/03/2018 Date Inactivated: 10/07/2018 Target Resolution Date: 10/01/2018 Unmet Goal Status: Unmet Reason: COMORBITIES Ulcer/skin breakdown will have a volume reduction of 50% by week 8 Date Initiated: 10/07/2018 Date Inactivated: 11/03/2018 Target Resolution Date: 10/31/2018 Unmet Goal Status: Unmet Reason: Osteomyelitis Interventions: Assess patient/caregiver ability to obtain necessary supplies Assess patient/caregiver ability to perform ulcer/skin care regimen upon admission and as needed Assess ulceration(s) every visit Provide education on ulcer and skin care Treatment Activities: Skin care regimen initiated : 09/03/2018 Topical wound management  initiated : 09/03/2018 Notes: Electronic Signature(s) Signed: 04/06/2019 5:01:35 PM By: [name] RN, BSN Entered By: [name] on 04/06/2019 15:22:58 -------------------------------------------------------------------------------- Pain Assessment Details Patient  Name: Date of Service: [name], [name] 04/06/2019 10:00 AM Medical Record Number:[id number] Patient Account Number: [id number] Date of Birth/Sex: Treating RN: Feb 25, 1971 (49 y.o. [name] Primary Care Margo Lama: [name] Other Clinician: Referring Meka Lewan: Treating Cornesha Radziewicz/Extender:[name], [name], [name] in Treatment: 30 Active Problems Location of Pain Severity and Description of Pain Patient Has Paino No Site Locations Rate the pain. Current Pain Level: 0 Pain Management and Medication Current Pain Management: Medication: No Cold Application: No Rest: No Massage: No Activity: No T.E.N.S.: No Heat Application: No Leg drop or elevation: No Is the Current Pain Management Adequate: Adequate How does your wound impact your activities of daily livingo Sleep: No Bathing: No Appetite: No Relationship With Others: No Bladder Continence: No Emotions: No Bowel Continence: No Work: No Toileting: No Drive: No Dressing: No Hobbies: No Electronic Signature(s) Signed: 04/06/2019 4:44:32 PM By: Deon Pilling Entered By: Deon Pilling on 04/06/2019 10:26:55 -------------------------------------------------------------------------------- Patient/Caregiver Education Details Patient Name: Date of Service: Mckey, Haylie L. 1/4/2021andnbsp10:00 AM Medical Record Number:[id number] Patient Account Number: [id number] Date of Birth/Gender: Treating RN: 1970/05/21 (49 y.o. [name] Primary Care Physician: [name] Other Clinician: Referring Physician: Treating Physician/Extender:[name], [name], [name] in Treatment: 30 Education Assessment Education  Provided To: Patient Education Topics Provided Wound/Skin Impairment: Methods: Explain/Verbal Responses: State content correctly Electronic Signature(s) Signed: 04/06/2019 5:01:35 PM By: [name] RN, BSN Entered By: [name] on 04/06/2019 15:23:13 -------------------------------------------------------------------------------- Wound Assessment Details Patient Name: Date of Service: [name] 04/06/2019 10:00 AM Medical Record Number:[id number] Patient Account Number: [id number] Date of Birth/Sex: Treating RN: [birth year] (49 y.o. [name], [name] Primary Care Jereme Loren: [name] Other Clinician: Referring Treveon Bourcier: Treating Wilkins Elpers/Extender:[name], [name], [name] in Treatment: 30 Wound Status Wound Number: 43 Primary Diabetic Wound/Ulcer of the Lower Extremity Etiology: Wound Location: Right Foot - Medial Wound Open Wounding Event: Gradually Appeared Status: Date Acquired: 08/04/2018 Comorbid Cataracts, Chronic sinus problems/congestion, Weeks Of Treatment: 30 History: Anemia, Sleep Apnea, Deep Vein Thrombosis, Clustered Wound: Yes Hypertension, Peripheral Arterial Disease, Type I Diabetes, Osteoarthritis, Osteomyelitis, Neuropathy, Seizure Disorder Photos Wound Measurements Length: (cm) 4.5 % Reducti Width: (cm) 3 % Reducti Depth: (cm) 0.2 Epithelia Clustered Quantity: 3 Tunneling Area: (cm) 10.603 Undermin Volume: (cm) 2.121 Wound Description Classification: Grade 4 Wound Margin: Well defined, not attached Exudate Amount: Medium Exudate Type: Serosanguineous Exudate Color: red, brown Wound Bed Granulation Amount: Large (67-100%) Granulation Quality: Red, Pink Necrotic Amount: Small (1-33%) Necrotic Quality: Adherent Slough Foul Odor After Cleansing: No Slough/Fibrino Yes Exposed Structure Fascia Exposed: No Fat Layer (Subcutaneous Tissue) Exposed: Yes Tendon Exposed: Yes Muscle Exposed: Yes Necrosis of Muscle:  Yes Joint Exposed: No Bone Exposed: No on in Area: 85.4% on in Volume: 90.3% lization: Medium (34-66%) : No ing: No Treatment Notes Wound #43 (Right, Medial Foot) 1. Cleanse With Wound Cleanser 3. Primary Dressing Applied Calcium Alginate Ag 4. Secondary Dressing Dry [name] Roll [name] 5. Secured With Recruitment consultant) Signed: 04/07/2019 3:29:37 PM By: [name] EMT/HBOT Signed: 04/08/2019 5:41:11 PM By: Deon Pilling Previous Signature: 04/06/2019 4:44:32 PM Version By: Deon Pilling Entered By: [name] on 04/07/2019 13:29:33 -------------------------------------------------------------------------------- Wound Assessment Details Patient Name: Date of Service: [name] 04/06/2019 10:00 AM Medical Record Number:[id number] Patient Account Number: [id number] Date of Birth/Sex: Treating RN: 10/03/1970 (49 y.o. [name] Primary Care Maymunah Stegemann: [name] Other Clinician: Referring Teagen Bucio: Treating Torres Hardenbrook/Extender:[name], [name], [name] in Treatment: 30 Wound Status Wound Number: 47 Primary Abscess Etiology: Wound Location: Right Lower Leg -  Anterior Wound Open Wounding Event: Gradually Appeared Status: Date Acquired: 01/31/2019 Comorbid Cataracts, Chronic sinus problems/congestion, Weeks Of Treatment: 9 History: Anemia, Sleep Apnea, Deep Vein Thrombosis, Clustered Wound: No Hypertension, Peripheral Arterial Disease, Type I Diabetes, Osteoarthritis, Osteomyelitis, Neuropathy, Seizure Disorder Photos Wound Measurements Length: (cm) 0.4 % Reduct Width: (cm) 0.2 % Reduct Depth: (cm) 0.1 Epitheli Area: (cm) 0.063 Tunneli Volume: (cm) 0.006 Undermi Wound Description Classification: Full Thickness Without Exposed Support Foul Odo Structures Slough/F Wound Distinct, outline attached Margin: Exudate Medium Amount: Exudate Serosanguineous Type: Exudate red, brown Color: Wound Bed Granulation Amount: Large  (67-100%) Granulation Quality: Pink Fascia E Necrotic Amount: None Present (0%) Fat Laye Tendon E Muscle Ex Joint Exp Bone Expo r After Cleansing: No ibrino No Exposed Structure xposed: No r (Subcutaneous Tissue) Exposed: Yes xposed: No posed: No osed: No sed: No ion in Area: 85.7% ion in Volume: 86.4% alization: Large (67-100%) ng: No ning: No Treatment Notes Wound #47 (Right, Anterior Lower Leg) 1. Cleanse With Wound Cleanser 3. Primary Dressing Applied Calcium Alginate Ag 4. Secondary Dressing Dry [name] Roll [name] 5. Secured With Recruitment consultant) Signed: 04/07/2019 3:29:37 PM By: [name] EMT/HBOT Signed: 04/08/2019 5:41:11 PM By: Deon Pilling Previous Signature: 04/06/2019 4:44:32 PM Version By: Deon Pilling Entered By: [name] on 04/07/2019 13:28:38 -------------------------------------------------------------------------------- Vitals Details Patient Name: Date of Service: [name] 04/06/2019 10:00 AM Medical Record Number:[id number] Patient Account Number: [id number] Date of Birth/Sex: Treating RN: 1970-07-03 (49 y.o. [name], [name] Primary Care : [name] Other Clinician: Referring : Treating /Extender:[name], [name], [name] in Treatment: 30 Vital Signs Time Taken: 10:26 Temperature (F): 98.4 Height (in): 67 Pulse (bpm): 96 Weight (lbs): 125 Respiratory Rate (breaths/min): 20 Body Mass Index (BMI): 19.6 Blood Pressure (mmHg): 153/72 Capillary Blood Glucose (mg/dl): 280 Reference Range: 80 - 120 mg / dl Electronic Signature(s) Signed: 04/06/2019 4:44:32 PM By: Deon Pilling Entered By: Deon Pilling on 04/06/2019 10:31:27

## 2019-04-06 NOTE — Progress Notes (Signed)
Patient came in today with everything, but her Tandem pump.  She had not opened the box, and charged the pump as directed by me, and said there were no other things in the box.  [facility] notified.  Pt. Did not also have her Dexcom sensors.  She was sent home to look to see if pump is in another box, and to call [facility] and myself back to reschedule appointment for possible tomorrow or when she gets her pump and Dexcom

## 2019-04-07 ENCOUNTER — Ambulatory Visit: Payer: Medicare HMO | Admitting: Endocrinology

## 2019-04-08 ENCOUNTER — Other Ambulatory Visit: Payer: Self-pay

## 2019-04-08 ENCOUNTER — Ambulatory Visit: Payer: Medicare HMO | Admitting: Endocrinology

## 2019-04-08 ENCOUNTER — Telehealth: Payer: Self-pay | Admitting: Nutrition

## 2019-04-08 MED ORDER — DEXCOM G6 RECEIVER DEVI: 1.0000 | 0 refills | Status: DC

## 2019-04-08 MED ORDER — DEXCOM G6 TRANSMITTER MISC: 1.0000 | 3 refills | Status: DC

## 2019-04-08 MED ORDER — DEXCOM G6 SENSOR MISC: 1.0000 | 3 refills | Status: DC

## 2019-04-08 NOTE — Telephone Encounter (Signed)
Patient reports that pump was in another box at home, but no Dexcom.  Discussed with her, that if she comes in tomorrow to start this pump, she will need to test her blood sugar ac and HS.  She refused this, and said she will call [facility] to see when her dexcom will arrive and call me when it comes in to reschedule her appointment. [employer] called for her and paperwork faxed the them because [facility] will not send this for her.

## 2019-04-08 NOTE — Patient Instructions (Signed)
See if pump and Dexcom are at home Call me and [facility] if they are not there.

## 2019-04-09 ENCOUNTER — Ambulatory Visit: Payer: Medicare HMO | Admitting: Endocrinology

## 2019-04-10 ENCOUNTER — Telehealth: Payer: Self-pay | Admitting: Endocrinology

## 2019-04-10 NOTE — Telephone Encounter (Signed)
Fisva from [facility] ph# [phone] called re: [name] is missing RX for receiver for the Dexcom. Please send RX for Dexcom Receiver to fax# [phone].

## 2019-04-10 NOTE — Telephone Encounter (Signed)
[place] to speak with them because a prescription was sent for the [place] receiver on 04/09/19. Rep that was on the line from [facility] did confirm that it was received, and the phone call was made by mistake.

## 2019-04-13 ENCOUNTER — Encounter: Payer: Medicare HMO | Admitting: Nutrition

## 2019-04-15 ENCOUNTER — Telehealth: Payer: Self-pay | Admitting: Nutrition

## 2019-04-15 NOTE — Telephone Encounter (Signed)
Message left on my machine from [place], that [facility] called her and told her that her Dexcom will come from [place], but that the our office must send the prescription to them.  I called [employer], and they are faxing the order for the Dexcom to [place] today.  I called the patient and gave her this update.

## 2019-04-20 ENCOUNTER — Encounter: Payer: Medicare HMO | Admitting: Nutrition

## 2019-04-20 ENCOUNTER — Other Ambulatory Visit: Payer: Self-pay

## 2019-04-20 ENCOUNTER — Encounter (HOSPITAL_BASED_OUTPATIENT_CLINIC_OR_DEPARTMENT_OTHER): Payer: Medicare HMO | Admitting: Internal Medicine

## 2019-04-20 DIAGNOSIS — E10621 Type 1 diabetes mellitus with foot ulcer: Secondary | ICD-10-CM | POA: Diagnosis not present

## 2019-04-20 DIAGNOSIS — N186 End stage renal disease: Secondary | ICD-10-CM

## 2019-04-20 DIAGNOSIS — N185 Chronic kidney disease, stage 5: Secondary | ICD-10-CM

## 2019-04-20 DIAGNOSIS — Z992 Dependence on renal dialysis: Secondary | ICD-10-CM

## 2019-04-21 ENCOUNTER — Ambulatory Visit (INDEPENDENT_AMBULATORY_CARE_PROVIDER_SITE_OTHER): Payer: Medicare HMO | Admitting: Vascular Surgery

## 2019-04-21 ENCOUNTER — Encounter: Payer: Self-pay | Admitting: Vascular Surgery

## 2019-04-21 ENCOUNTER — Encounter: Payer: Self-pay | Admitting: *Deleted

## 2019-04-21 ENCOUNTER — Ambulatory Visit (HOSPITAL_COMMUNITY)
Admission: RE | Admit: 2019-04-21 | Discharge: 2019-04-21 | Disposition: A | Discharge status: Home / Self Care | Payer: Medicare HMO | Source: Ambulatory Visit | Attending: Vascular Surgery | Admitting: Vascular Surgery

## 2019-04-21 ENCOUNTER — Ambulatory Visit: Payer: Medicare HMO | Admitting: Endocrinology

## 2019-04-21 ENCOUNTER — Other Ambulatory Visit: Payer: Self-pay | Admitting: *Deleted

## 2019-04-21 ENCOUNTER — Telehealth: Payer: Self-pay | Admitting: *Deleted

## 2019-04-21 VITALS — BP 116/62 | HR 99 | Temp 97.3°F | Resp 20 | Ht 67.0 in | Wt 126.8 lb

## 2019-04-21 DIAGNOSIS — Z992 Dependence on renal dialysis: Secondary | ICD-10-CM

## 2019-04-21 DIAGNOSIS — N186 End stage renal disease: Secondary | ICD-10-CM

## 2019-04-21 NOTE — Progress Notes (Signed)
MELAINE, MCPHEE ([id number]) Visit Report for 04/20/2019 HPI Details Patient Name: Date of Service: [name], BIANCHINI 04/20/2019 12:45 PM Medical Record Number:[id number] Patient Account Number: [id number] Date of Birth/Sex: Treating RN: 02/22/1971 (49 y.o. F) Primary Care Provider: [name] Other Clinician: Referring Provider: Treating Provider/Extender:Janeese Mcgloin, [name], [name] in Treatment: 67 History of Present Illness HPI Description: 49 year old diabetic who is known to have type 1 diabetes which is poorly controlled last hemoglobin A1c was 11%. She comes in with a ulcerated area on the left lateral foot which has been there for over 6 months. Was recently she has been treated by [name] of podiatry who saw her last on 05/28/2016. Review of his notes revealed that the patient had incision and drainage with placement of antibiotic beads to the left foot on 04/11/2016 for possible osteomyelitis of the cuboid bone. Over the last year she's had a history of amputation of the left fifth toe and a femoropopliteal popliteal bypass graft somewhere in April 2017. 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 His note [name] mentions that the patient has been referred to me for further wound care and possibly great candidate for hyperbaric oxygen therapy due to recurrent osteomyelitis. However we do not have any x-rays of biopsy reports confirming this. He has been on several antibiotics including Bactrim and most recently is on doxycycline for an MRSA. I understand, the patient was not a candidate for IV antibiotics as she has had previous PICC lines which resulted in blood clots in both arms. There was a x-ray report dated 04/04/2016 on [name] notes which showed evidence of fifth ray resection left foot with osteolytic changes noted to the fourth metatarsal and cuboid bone on the left. 06/13/2016 -- had a left foot x-ray which showed no acute  fracture or dislocation and no definite radiographic evidence of osteomyelitis. Advanced osteopenia was seen. 06/20/2016 -- she has noticed a new wound on the right plantar foot in the region where she had a callus before. 06/27/16- the patient did have her x-ray of the right foot which showed no findings to suggest osteomyelitis. She saw her endocrinologist, [name], yesterday. Her A1c in January was 11. He also indicates mismanagement and noncompliance regarding her diabetes. She is currently on Bactrim for a lip infection. She is complaining of nausea, vomiting and diarrhea. She is unable to articulate the exact orders or dosing of the Bactrim; it is unclear when she will complete this. 07/04/2016 -- results from [facility] of ABIs with ankle waveforms were noted from 02/14/2016. The examination done on 06/27/2015 showed noncompressible ABIs with the right being 1.45 and the left being 1.33. The present examination showed a right ABI of 1.19 on the left of 1.33. The conclusion was that right normal ABI in the lower extremity at rest however compared to previous study which was noncompressible ABI may be falsely elevated side suggesting medial calcification. The left ABI suggested medial calcification. 08/01/2016 -- the patient had more redness and pain on her right foot and did not get to come to see as noted she see her PCP or go to the ER and decided to take some leftover metronidazole which she had at home. As usual, the patient does report she feels and is rather noncompliant. 08/08/2016 -- -- x-ray of the right foot -- FINDINGS:Transmetatarsal amputation is noted. No bony destruction is noted to suggest osteomyelitis. IMPRESSION: No evidence of osteomyelitis. Postsurgical changes are seen. MRI would be more sensitive for possible  bony changes. Culture has grown Serratia Marcescens -- sensitive to Bactrim, ciprofloxacin, ceftazidime she was seen by [name] on 08/06/2016. He  did not find any exposed bone, muscle, tendon, ligament or joint. There was no malodor and he did a excisional debridement in the office. ============ Old notes: 49 year old patient who is known to the wound clinic for a while had been away from the wound clinic since 09/01/2014. Over the last several months she has been admitted to various hospitals including [place] at [facility]. She was treated for a right metatarsal osteomyelitis with a transmetatarsal amputation and this was done about 2 months ago. He has a small ulcerated area on the right heel and she continues to have an ulcerated area on the left plantar aspect of the foot. The patient was recently admitted to the [facility] hospital group between 7/12 and 10/18/2014. she was given 3 weeks of IV vancomycin and was to follow-up with her surgeons at [facility] and also took oral vancomycin for C. difficile colitis. 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 MRI of the left foot done on 10/14/2014 showed no abscess or osteomyelitis. 04/27/15; this is a patient we know from previous stays in the wound care center. She is a type I diabetic I am not sure of her control currently. Since the last time I saw her she is had a right transmetatarsal amputation and has no wounds on her right foot and has no open wounds. She is been followed at the wound care center at [facility] in [place]. She comes today with the desire to undergo hyperbaric treatment locally. Apparently one of her wound care providers in [place] has suggested hyperbarics. This is in response to an MRI from 04/18/15 that showed increased marrow signal and loss of the proximal fifth  metatarsal cortex evidence of osteomyelitis with likely early osteomyelitis in the cuboid bone as well. She has a large wound over the base of the fifth metatarsal. She also has a eschar over her the tips of her toes on 1,3 and 5. She does not have peripheral pulses and apparently is going for an angiogram tomorrow which seems reasonable. After this she is going to infectious disease at [facility]. They have been using Medihoney to the large wound on the lateral aspect of the left foot to. The patient has known Charcot deformity from diabetic neuropathy. She also has known diabetic PAD. Surprisingly I can't see that she has had any recent antibiotics, the patient states the last antibiotic she had was at the end of November for 10 days. I think this was in response to culture that showed group G strep although I'm not exactly sure where the culture was from. She is also had arterial studies on 03/29/15. This showed a right ABI of 1.4 that was noncompressible. Her left ABI was 0.73. 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 It was not felt that arterial inflow was adequate for healing of a foot ulcer. Her Doppler waveforms looked monophasic ===== READMISSION 02/28/17; this is in an now 49 year old woman we've had at several different occasions in this clinic. 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 She has a remote ex-smoker. She was last seen in this clinic by  Dr. Con Memos I think in May. More recently she is been followed by her podiatrist [name] an infectious disease Dr. Megan Salon. She has 2 open wounds the major one is over the right first metatarsal head she also has a wound on the left plantar foot. an MRI of the right foot on 01/01/17 showed a soft tissue ulcer along the plantar aspect of the first metatarsal base consistent with osteomyelitis of the first metatarsal stump. Dr. 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 According to the patient this is been open for slightly over a month. She has been on a combination of Cipro 500 twice a day, Zyvox 600 twice a day and Flagyl 500 3 times a day for over a month now as directed by Dr. Megan Salon. cultures of the right foot earlier this year showed MRSA in January and Serratia in May. January also had a few viridans strep. Recent x-rays of both feet were done and [name] office and I don't have these reports. The patient has known PAD and has a history of aleft femoropopliteal bypass in April 2017. 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 Dwyane Dee. In spite of this the last hemoglobin A1c I can see is 10.1 on 01/01/2017. She is being referred by 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 She is been using Medihoney to this area and also an area on the left midfoot. She is using healing sandals bilaterally. ABIs in this clinic at the left posterior tibial was 1.1 noncompressible on the right READMISSION Non invasive vascular NOVANT 5/18 Aftercare following surgery of the circulatory system Procedure Note - Interface, External Ris In - 08/13/2016 11:05 AM EDT Procedure: Examination consists of physiologic resting arterial pressures of the brachial and ankle arteries bilaterally with continuous wave Doppler waveform analysis. Previous: Previous exam performed on 02/14/16 demonstrated ABIs of Rt = 1.19 and Lt = 1.33. Right: ABI = non-compressible PT, 1.47 DP. S/P transmet amputation. Left: ABI = 1.52, 2nd digit pressure = 87 mmHg Conclusions: Right: ABI (>1.3) may be falsely elevated, suggesting medial calcification. Left: ABI (>1.3) may be falsely  elevated, suggesting medial calcification The patient is a now 49 year old type I diabetic is had multiple issues her graded to chronic diabetic foot ulcers. 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 We had her in the clinic lastin November. At that point she had wounds on her bilateral feet.she had wanted to try hyperbarics however the healogics review process denied her because she hadn't followed up with her vascular surgeon for her left femoropopliteal bypass. The bypass was done by Dr. Raul Del at [facility]. We made her a follow-up with Dr. Raul Del however she did not keep the appointment and therefore she was not approved The patient shows me a small wound on her left fourth metatarsal head on her phone. She developed rapid discoloration in the plantar aspect of the left foot and she was admitted to hospital from 2/2 through 05/10/17 with wet gangrene of the left foot osteomyelitis of the fourth metatarsal heads. She was admitted acutely ill with a temperature of 103. She was started on broad-spectrum vancomycin and cefepime. On 05/06/17 she was  taken to the OR by [name] her podiatric surgeon for an incision and drainage irrigation of the left foot wound. Cultures from this surgery revealed group be strep and anaerobes. she was seen by Dr.Xu of orthopedic surgery and scheduled for a below-knee amputation which she u refused. Ultimately she was discharged on Levaquin and Flagyl for one month. MRI 05/05/17 done while she was in the hospital showed abscess adjacent to the fourth metatarsal head and neck small abscess around the fourth flexor tendon. Inflammatory phlegmon and gas in the soft tissues along the lateral aspect of the fourth phalanx. Findings worrisome for osteomyelitis involving the fourth proximal and middle phalanx and also the third and fourth metatarsals. Finally the patient had actually shortly  before this followed up with Dr. Raul Del at no time on 04/29/17. He felt that her left femoropopliteal bypass was patent he felt that her left-sided toe pressures more than adequate for healing a wound on the left foot. This was before her acute presentation. 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 05/28/17; she is started hyperbarics. The patient tells me that for some reason she was not actually on Levaquin but I think on ciprofloxacin. She was on Flagyl. She only started her Levaquin yesterday due to some difficulty with the pharmacy and perhaps her sister picking it up. She has an appointment with [name] tomorrow and with infectious disease early next week. She has no new complaints 06/06/17; the patient continues in hyperbarics. She saw [name] on 05/29/17 who is her podiatric surgeon. He is elected for a transmetatarsal amputation on 06/27/17. I'm not sure at what level he plans to do this amputation. The patient is unaware She also saw Dr. Megan Salon of infectious disease who elected to continue her on current antibiotics I think this is ciprofloxacin and Flagyl. I'll need to clarify with her tomorrow if she actually has this. We're using silver alginate to the actual wound. Necrotic surface today with material under the flap of her foot. Original MRI showed abscesses as well as osteomyelitis of the proximal and middle fourth phalanx and the third and fourth metatarsal heads 06/11/17; patient continues in hyperbarics and continues on oral antibiotics. She is doing well. The wound looks better. The necrotic part of this under the flap in her superior foot also looks better. she is been to see [name]. I haven't had a chance to look at his note. Apparently he has put the transmetatarsal amputation on hold her request it is still planning to take her to the OR for debridement and product application ACEL. I'll see if I can find his note. I'll therefore leave product ordering/requests to Dr.  [name] for now. I was going to look at Dermagraft 06/18/17-she is here in follow-up evaluation for bilateral foot wounds. She continues with hyperbaric therapy. She states she has been applying manuka honey to the right plantar foot and alternate manuka honey and silver alginate to the left foot, despite our orders. We will continue with same treatment plan and she will follo up next week. 06/25/17; I have reviewed [name] last note from 3/11. She has operative debridement in 2 days' time. By review his note apparently they're going to place there is skin over the majority of this wound which is a good choice. She has a small satellite area at the most proximal part of this wound on the left plantar foot. The area on the right plantar foot we've been using silver alginate and it is close to  healing. 07/02/17; unfortunately the patient was not easily approved for [name] proposed surgery. I'm not completely certain what the issue is. She has been using silver alginate to the wound she has completed a first course of hyperbarics. She is still on Levaquin and Flagyl. I have really lost track of the time course here.I suspect she should have another week to 2 of antibiotics. I'll need to see if she is followed up with infectious disease Dr. Megan Salon 07/09/17; the patient is followed up with Dr. Megan Salon. 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 She continues on Levaquin and metronidazole continuing both of these for now I think she is been on fr about 6 weeks. She still has some drainage but no pain. No fever. Her had been plans for her to go to the OR for operative debridement with her podiatrist [name], I am not exactly sure where that is. I'll probably slip a note to [name] today. I note that she follows with Dr. Dwyane Dee of endocrinology. We have her recertified for hyperbaric oxygen. I have not heard about Dermagraft however I'll see if [name] is  planning a skin substitute as well 07/16/17; the patient tells me she is just about out of [place]. I'll need to check Dr. Hale Bogus last notes on this. She states she has plenty of Flagyl however. She comes in today complaining of pain in the right lateral foot which she said lasted for about a day. The wound on the right foot is actually much more medially. She also tells me that the [facility] cost a lot of pain in the left foot wound and she turned back to silver alginate. Finally Dermagraft has a $782 per application co-pay. She cannot afford this 07/23/17; patient arrives today with the wound not much smaller. There is not much new to add. She has not heard from [name] all try to put in a call to them today. She was asking about Dermagraft again and she has an over $423 per application co-pay she states that she would be willing to try to do a payment plan. I been tried to avoid this. We've been using silver alginate, I'll change to [facility] 07/30/17-She is here in follow-up evaluation for left foot ulcer. She continues hyperbaric medicine. The left foot ulcer is stable we will continue with same treatment plan 08/06/17; she is here for evaluation of her left foot ulcer. Currently being treated for hyperbarics or underlying osteomyelitis. She is completed antibiotics. The left foot ulcer is better smaller with healthier looking granulation. For various reasons I am not really clear on we never got her back to the OR with [name]. He did not respond to my secure text message. Nevertheless I think that surgery on this point is not necessary nor am I completely clear that a skin substitute is necessary The patient is complaining about pain on the outside of her right foot. 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 There is no erythema. She also states the foot is warm versus her other part of her upper leg and this is largely true. It is not totally clear to me what's  causing this. She thinks it's different from her usual neuropathy pain 08/13/17; she arrives in clinic today with a small wound which is superficial on her right first metatarsal head. 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 She tells [place] she was up on her feet  over the Mother's Day celebration. The large wound is on the left foot. Continues with hyperbarics for underlying osteomyelitis. We're using Hydrofera Blue. She asked me today about where we were with Dermagraft. I had actually excluded this because of the co- pay however she wants to assume this therefore I'll recheck the co-pay an order for next week. 08/20/17; the patient agreed to accept the co-pay of the first Apligraf which we applied today. She is disappointed she is finishing hyperbarics will run this through the insurance on the extent of the foot infection and the extent of the wound that she had however she is already had 60 dive's. Dermagraft No. 1 08/27/17; Dermagraft No. 2. She is not eligible for any more hyperbaric treatments this month. She reports a fair amount of drainage and she actually changed to the external dressings without disturbing the direct contact layer 09/03/17; the patient arrived in clinic today with the wound superficially looking quite healthy. Nice vibrant red tissue with some advancing epithelialization although not as much adherence of the flap as I might like. However she noted on her own fourth toe some bogginess and she brought that to our attention. Indeed this was boggy feeling like a possibility of subcutaneous fluid. 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 She is not been unwell. We've been using Dermagraft 09/10/17; the culture that I did not last week was MRSA. She saw Dr. Megan Salon this morning who is going to start her on vancomycin. I had sent him a secure a text message yesterday. I also spoke with  her podiatric surgeon [name] about surgery on this foot the options for conserving a functional foot etc. Promised me he would see her and will make back consultation today. Paradoxically her actual wound on the plantar aspect of her left foot looks really quite good. I had given her 5 days worth of Baxdella to cover her for MRSA. Her MRI came back showing osteomyelitis within the third metatarsal shaft and head and base of the third and fourth proximal phalanx. She had extensive inflammatory changes throughout the soft tissue of the lateral forefoot. With an ill-defined fluid around the fourth metatarsal extending into the plantar and dorsal soft tissues 09/19/17; the patient is actually on oral Septra and Flagyl. She apparently refused IV vancomycin. She also saw [name] at my request who is planning her for a left BKA sometime in mid July. MRI showed osteomyelitis within the third metatarsal shaft and head and the basis of the third and fourth proximal phalanx. I believe there was felt to be possible septic arthritis involving the third MTP. 09/26/17; the patient went back to Dr. Megan Salon at my suggestion and is now receiving IV daptomycin. Her wound continues to look quite good making the decision to proceed with a transmetatarsal amputation although more difficult for the patient. I believe in my extensive discussions with her she has a good sense of the pros and cons of this. I don't NV the tuft decision she has to make. She has an appointment with [name] I believe in mid July and I previously spoken to him about this issue Has we had used 3 previous Dermagraft. Given the condition of the wound surface I went ahead and added the fourth one today area and I did this not fully realizing that she'll be traveling to [place] next week. I'm hopeful she can come back in  2 weeks 10/21/17; Her same Dermagraft on for about 3-1/2 weeks. In spite of this the wound arrives looking quite  healthy. There is been a lot of healing dimensions are smaller. Looking at the square shaped wound she has now there is some undermining and some depth medially under the undermining although I cannot palpate any bone. No surrounding infection is obvious. She has difficult questions about how to look at this going forward vis--vis amputations versus continued medical therapy. To be truthful the wound is looks so healthy and it is continued to contract. Hard to justify foot surgery at this point although I still told her that I think it might come to that if we are not able to eradicate the underlying MRSA. She is still highly at risk and she understands this 11/06/17 on evaluation today patient appears to be doing better in regard to her foot ulcer. She's been tolerating the dressing changes without complication. Currently she is here for her Dermagraft #6. Her wound continues to make excellent progress at this point. She does not appear to have any evidence of infection which is good news. 11/13/17 on evaluation today patient appears to be doing excellent at this time. She is here for repeat Dermagraft application. This is #7. Overall her wound seems to be making great progress. 12/05/17; the patient arrives with the wound in much better condition than when I last saw this almost 6 weeks ago. She still has a small probing area in the left metatarsal head region on the lateral aspect of her foot. We applied her last Dermagraft today. Since the last time she is here she has what appears to a been a blood blister on the plantar aspect of left foot although I don't see this is threatening. There is also a thick raised tissue on the right mid metatarsal head region. This was not there I don't think the last time she was here 3 weeks ago. 12/12/17; the patient continues to have a small programming area in the left metatarsal head region on the lateral aspect of her foot which was the initial large surgical  wound. I applied her last Apligraf last week. I'm going to use Endoform starting today Unfortunately she has an excoriated area in the left mid foot and the right mid foot. The left midfoot looks like a blistered area this was not opened last week it certainly is open today. Using silver alginate on these areas. She promises me she is offloading this. 12/19/17; the small probing area in the left metatarsal head eyes think is shallower. In general her original wound looks better. We've been using Endoform. The area inferiorly that I think was trauma last week still requires debridement a lot of nonviable surface which I removed. She still has an open open area distally in her foot Similarly on the right foot there is tightly adherent surface debris which I removed. Still areas that don't look completely epithelialized. This is a small open area. We used silver alginate on these areas 12/26/2017; the patient did not have the supplies we ordered from last week including the Endoform. The original large wound on the left lateral foot looks healthy. She still has the undermining area that is largely unchanged from last week. She has the same heavily callused raised edged wounds on the right mid and left midfoot. Both of these requiring debridement. We have been using silver alginate on these areas 01/02/2018; there is still supply issues. We are going to try to use  Prisma but I am not sure she actually got it from what she is saying. She has a new open area on the lateral aspect of the left fourth toe [previous fifth ray amputation]. Still the one tunneling area over the fourth metatarsal head. The area is in the midfoot bilaterally still have thick callus around them. She is concerned about a raised swelling on the lateral aspect of the foot. However she is completely insensate 01/10/2018; we are using Prisma to the wounds on her bilateral feet. Surprisingly the tunneling area over the left fourth  metatarsal head that was part of her original surgery has closed down. She has a small open area remaining on the incision line. 2 open areas in the midfoot. 02/10/2018; the patient arrives back in clinic after a month hiatus. She was traveling to visit family in [place]. Is fairly clear she was not offloading the areas on her feet. The original wound over the left lateral foot at the level of metatarsal heads is reopened and probes medially by about a centimeter or 2. She notes that a week ago she had purulent drainage come out of an area on the left midfoot. Paradoxically the worst area is actually on the right foot is extensive with purulent drainage. We will use silver alginate today 02/17/2018; the patient has 3 wounds one over the left lateral foot. She still has a small area over the metatarsal heads which is the remnant of her original surgical wound. This has medial probing depth of roughly 1.4 cm somewhat better than last week. The area on the right foot is larger. We have been using silver alginate to all areas. The area on the right foot and left foot that we cultured last week showed both Klebsiella and Proteus. Both of these are quinolone sensitive. The patient put her's self on Bactrim and Flagyl that she had left hanging around from prior antibiotic usages. She was apparently on this last week when she arrived. I did not realize this. Unfortunately the Bactrim will not cover either 1 of these organisms. We will send in Cipro 500 twice daily for a week 03/04/2018; the patient has 2 wounds on the left foot one is the original wound which was a surgical wound for a deep DFU. At one point this had exposed bone. She still has an area over the fourth metatarsal head that probes about 1.4 cm although I think this is better than last week. I been using silver nitrate to try and promote tissue adherence and been using silver alginate here. She also has an area in the left midfoot. This has  some depth but a small linear wound. Still requiring debridement. On the right midfoot is a circular wound. A lot of thick callus around this area. We have been using silver alginate to all wound areas She is completed the ciprofloxacin I gave her 2 weeks ago. 03/11/2018; the patient continues to have 2 open areas on the left foot 1 of which was the original surgical wound for a deep DFU. Only a small probing area remains although this is not much different from last week we have been using silver alginate. The other area is on the midfoot this is smaller linear but still with some depth. We have been using silver alginate here as well On the right foot she has a small circular wound in the mid aspect. This is not much smaller than last time. We have been using silver alginate here as well 03/18/2018; she  has 3 wounds on the left foot the original surgical wound, a very superficial wound in the mid aspect and then finally the area in the mid plantar foot. She arrives in today with a very concerning area in the wound in the mid plantar foot which is her most proximal wound. There is undermining here of roughly 1-1/2 cm superiorly. Serosanguineous drainage. She tells me she had some pain on for over the weekend that shot up her foot into her thigh and she tells me that she had a nodule in the groin area. She has the single wound in the right foot. We are using endoform to both wound areas 03/24/2018; the patient arrives with the original surgical wound in the area on the left midfoot about the same as last week. There is a collection of fluid under the surface of the skin extending from the surgical wound towards the midfoot although it does not reach the midfoot wound. The area on the right foot is about the same. Cultures from last week of the left midfoot wound showed abundant Klebsiella abundant Enterococcus faecalis and moderate methicillin resistant staph I gave her Levaquin but this would  have only covered the Klebsiella. She will need linezolid 04/01/2018; she is taking linezolid but for the first few days only took 1 a day. I have advised her to finish this at twice daily dosing. In any case all of her wounds are a lot better especially on the left foot. The original surgical wound is closed. The area on the left midfoot considerably smaller. The area on the right foot also smaller. 04/08/2018; her original surgical wound/osteomyelitis on the left foot remains closed. She has area on the left foot that is in the midfoot area but she had some streaking towards this. This is not connected with her original wound at least not visually. Small wound on the right midfoot appears somewhat smaller. 04/15/18; both wounds looks better. Original wound is better left midfoot. Using silver alginate 1/21; patient states she uses saltwater soak in, stones or remove callus from around her wounds. She is also concerned about a blood blister she had on the left foot but it simply resolved on its own. We've been using silver alginate 1/28; the patient arrives today with the same streaking area from her metatarsals laterally [the site of her original surgical wound] down to the middle of her foot. There is some drainage in the subcutaneous area here. This concerns me that there is actually continued ongoing infection in the metatarsals probably the fourth and third. This fixates an MRI of the foot without contrast [chronic renal failure] The wound in the mid part of the foot is small but I wonder whether this area actually connects with the more distal foot. The area on the right midfoot is probably about the same. Callus thick skin around the small wound which I removed with a curette we have been using silver alginate on both wound areas 2/4; culture I did of the draining site on the left foot last time grew methicillin sensitive staph aureus. MRI of the left foot showed interval resolution of the  findings surrounding the third metatarsal joint on the prior study consistent with treated osteomyelitis. Chronic soft tissue ulceration in the plantar and lateral aspect of the forefoot without residual focal fluid collection. No evidence of recurrent osteomyelitis. Noted to have the previous amputation of the distal first phalanx and fifth ray MRI of the right foot showed no evidence of osteomyelitis I  am going to treat the patient with a prolonged course of antibiotics directed against MSSA in the left foot 2/11; patient continues on cephalexin. She tells me she had nausea and vomiting over the weekend and missed 2 days. In general her foot looks much the same. She has a small open area just below the left fourth metatarsal head. A linear area in the left midfoot. Some discoloration extending from the inferior part of this into the left lateral foot although this appears to be superficial. She has a small area on the right midfoot which generally looks smaller after debridement 2/18; the patient is completing his cephalexin and has another 2 days. She continues to have open areas on the left and right foot. 2/25; she is now off antibiotics. The area on the left foot at the site of her original surgical wound has closed yet again. She still has open areas in the mid part of her foot however these appear smaller. The area on the right mid foot looks about the same. We have been using silver alginate She tells me she had a serious hypoglycemic spell at home. She had to have EMS called and get IV dextrose 3/3; disappointing on the left lateral foot large area of necrotic tissue surrounding the linear area. This appears to track up towards the same original surgical wound. Required extensive debridement. The area on the right plantar foot is not a lot better also using silver 3/12; the culture I did last time showed abundant enterococcus. I have prescribed Augmentin, should cover any unrecognized  anaerobes as well. In addition there were a few MRSA and Serratia that would not be well covered although I did not want to give her multiple antibiotics. She comes in today with a new wound in the right midfoot this is not connected with the original wound over her MTP a lot of thick callus tissue around both wounds but once again she said she is not walking on these areas 3/17-Patient comes in for follow-up on the bilateral plantar wounds, the right midfoot and the left plantar wound. Both these are heavily callused surrounding the wounds. We are continuing to use silver alginate, she is compliant with offloading and states she uses a wheelchair fairly often at home 3/24; both wound areas have thick callus. However things actually look quite a bit better here for the majority of her left foot and the right foot. 3/31; patient continues to have thick callused somewhat irritated looking tissue around the wounds which individually are fairly superficial. There is no evidence of surrounding infection. We have been using silver alginate however I change that to [facility] today 4/17; patient returns to clinic after having a scare with Covid she tested negative in her primary doctor's office. She has been using Hydrofera Blue. She does not have an open area on the right foot. On the left foot she has a small open area with the mid area not completely viable. She showed me pictures of what looks like a hemorrhagic blister from several days ago but that seems to have healed over this was on the lateral left foot 4/21; patient comes in to clinic with both her wounds on her feet closed. However over the weekend she started having pain in her right foot and leg up into the thigh. She felt as though she was running a low-grade fever but did not take her temperature. She took a doxycycline that she had leftover and yesterday a single Septra and metronidazole.  She thinks things feel somewhat  better. 4/28; duplex ultrasound I ordered last week was negative for DVT or superficial thrombophlebitis. She is completed the doxycycline I gave her. States she is still having a lot of pain in the right calf and right ankle which is no better than last week. She cannot sleep. She also states she has a temperature of up to 101, coughing and complaining of visual loss in her bilateral eyes. Apparently she was tested for Covid 2 weeks ago at [facility] and that was negative. Readmission: 09/03/18 patient presents back for reevaluation after having been evaluated at the end of April regarding erythema and swelling of her right lower extremity. Subsequently she ended up going to the hospital on 07/29/18 and was admitted not to be discharged until 08/08/18. Unfortunately it was noted during the time that she was in the hospital that she did have methicillin-resistant Staphylococcus aureus as the infection noted at the site. It was also determined that she did have osteomyelitis which appears to be fairly significant. She was treated with vancomycin and in fact is still on IV vancomycin at dialysis currently. This is actually slated to continue until 09/12/18 at least which will be the completion of the six weeks of therapy. Nonetheless based on what I'm seeing at this point I'm not sure she will be anywhere near ready to discontinue antibiotics at that time. Since she was released from the hospital she was seen by [name] who is her podiatrist on 08/27/18. His note specifically states that he is recommended that the patient needs of one knee amputation on the right as she has a life-threatening situation that can lead quickly to sepsis. The patient advised she would like to try to save her leg to which [name] apparently told her that this was against all medical advice. She also want to discontinue the Wound VAC which had been initiated due to the fact that she wasn't pleased with how the wound was looking  and subsequently she wanted to pursue applying Medihoney at that time. He stated that he did not believe that the right lower extremity was salvageable and that the patient understood but would still like to attempt hyperbaric option therapy if it could be of any benefit. She was therefore referred back to [place] for further evaluation. He plans to see her back next week. Upon inspection today patient has a significant amount purulent drainage noted from the wound at this point. The bone in the distal portion of her foot also appears to be extremely necrotic and spongy. When I push down on the bone it bubbles and seeps purulent drainage from deeper in the end of the foot. I do not think that this is likely going to heal very well at all and less aggressive surgical debridement were undertaken more than what I believe we can likely do here in our office. 09/12/2018; I have not seen this patient since the most recent hospitalization although she was in our clinic last week. I have reviewed some of her records from a complex hospitalization. 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 There is situation was complicated by MRSA bacteremia and acute on chronic renal failure now on dialysis. She is receiving vancomycin at dialysis. We started her on Dakin's wet-to- dry last week she is changing this daily. There is still purulent drainage coming out of her foot. Although she is apparently "agreeable" to a below-knee amputation which is been  suggested by multiple clinicians she wants this to be done in [place]. She apparently has a telehealth visit with that provider sometime in late [place] 6/24. I have told her I think this is probably too long. Nevertheless I could not convince her to allow a local doctor to perform BKA. 09/19/2018; the patient has a large necrotic area on the right anterior foot. She has had previous transmetatarsal amputations. Culture I did  last week showed MRSA nothing else she is on vancomycin at dialysis. She has continued leaking purulent drainage out of the distal part of the large circular wound on the right anterior foot. She apparently went to see Dr. 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 Somehow that translated into her being referred to plastic surgery for debridement of the area. I gather she basically refused amputation although I do not have a copy of Dr. Berenice Primas notes. The patient really wants to have a trial of hyperbaric oxygen. I agreed with initial assessment in this clinic that this was probably too far along to benefit however if she is going to have plastic surgery I think she would benefit from ancillary hyperbaric oxygen. The issue here is that the patient has benefited as maximally as any patient I have ever seen from hyperbaric oxygen therapy. Most recently she had exposed bone on the lateral part of her left foot after a surgical procedure and that actually has closed. She has eschared areas in both heels but no open area. She is remained systemically well. I am not optimistic that anything can be done about this but the patient is very clear that she wants an attempt. The attempt would include a wound VAC further debridements and hyperbaric oxygen along with IV antibiotics. 6/26; I put her in for a trial of hyperbaric oxygen only because of the dramatic response she has had with wounds on her left midfoot earlier this year which was a surgical wound that went straight to her bone over the metatarsal heads and also remotely the left third toe. We will see if we can get this through our review process and insurance. She arrives in clinic with again purulent material pouring out of necrotic bone on the top of the foot distally. There is also some concerning erythema on the front of the leg that we marked. It is bit difficult to tell how tender this is because of neuropathy. I  note from infectious disease that she had her vancomycin extended. 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 She had the wound VAC on for part of the week. The rest of the time she is putting various things on this including Medihoney, "ionized water" silver sorb gel etc. 7/7; follow-up along with HBO. She is still on vancomycin at dialysis. She has a large open area on the dorsal right foot and a small dark eschar area on her heel. There is a lot less erythema in the area and a lot less tenderness. From an infection point of view I think this is better. She still has a lot of necrosis in the remaining right forefoot [previous TMA] we are still using the wound VAC in this area 7/16; follow-up along with HBO. I put her on linezolid after she finished her vancomycin. We started this last Friday I gave her 2 weeks worth. I had the expectation that she would be operatively debrided by Dr. Marla Roe but that still has not happened yet. Patient phoned the office this  week. She arrives for review today after HBO. The distal part of this wound is completely necrotic. Nonviable pieces of tendon bone was still purulent drainage. Also concerning that she has black eschar over the heel that is expanding. I think this may be indicative of infection in this area as well. She has less erythema and warmth in the ankle and calf but still an abnormal exam 7/21 follow-up along with HBO. I will renew her linezolid after checking a CBC with differential monitoring her blood counts especially her platelets. She was supposed to have surgery yesterday but if I am reading things correctly this was canceled after her blood sugar was found to be over 500. I thought Dr. Marla Roe who called me said that they were sending her to the ER but the patient states that was not the case. 7/28. Follow-up along with HBO. She is on linezolid I still do not have any lab work from dialysis even though  I called last week. The patient is concerned about an area on her left lateral foot about the level of the base of her fifth metatarsal. I did not really see anything that ominous here however this patient is in [place] ability to point out problems that she is sensing and she has been accurate in the past Finally she received a call from Dr. Marla Roe who is referring her to another orthopedic surgeon stating that she is too booked up to take her to the operating room now. Was still using a wound VAC on the foot 8/3 -Follow-up after HBO, she is got another week of linezolid, she is to call ID for an appointment, x-rays of both feet were reviewed, the left foot x-ray with third MTP joint osteo- Right foot x-ray widespread osteo-in the right midfoot Right ankle x-ray does not show any active evidence of infection 8/11-Patient is seen after HBO, the wounds on the right foot appear to be about the same, the heel wound had some necrotic base over tendon that was debrided with a curette 8/21; patient is seen after HBO. The patient's wound on her dorsal foot actually looks reasonably good and there is substantial amount of epithelialization however the open area distally still has a lot of necrotic debris partially bone. I cannot really get a good sense of just how deep this probes under the foot. She has been pressuring me this week to order medical maggots through a company in [place] for her. The problem I have is there is not a defined wound area here. On the positive side there is no purulence. She has been to see infectious disease she is still on Septra DS although I have not had a chance to review their notes 8/28; patient is seen in conjunction with HBO. The wounds on her foot continued to improve including the right dorsal foot substantially the, the distal part of this wound and the area on the right heel. We have been using a wound VAC over this chronically. She is still on trimethoprim  as directed by infectious disease 9/4; patient is seen in conjunction with HBO. Right dorsal foot wound substantially anteriorly is better however she continues to have a deep wound in the distal part of this that is not responding. We have been using silver collagen under border foam Area on the right plantar medial heel seems better. We have been using Hydrofera Blue 12/12/18 on evaluation today patient appears to be doing about the same with regard to her wound based on prior  measurements. She does have some necrotic tissue noted on the lateral aspect of the wound that is going require a little bit of sharp debridement today. This includes what appears to be potentially either severely necrotic bone or tendon. Nonetheless other than that she does not appear to have any severe infection which is good news 9/18; it is been 2 weeks since I saw this wound. She is tolerating HBO well. Continued dramatic improvement in the area on the right dorsal foot. She still has a small wound on the heel that we have been using Hydrofera Blue. She continues with a wound VAC 9/24; patient has to be seen emergently today with a swelling on her right lateral lower leg. She says that she told Dr. Evette Doffing about this and also myself on a couple of occasions but I really have no recollection of this. She is not systemically unwell and her wound really looked good the last time I saw this. She showed this to providers at dialysis and she was able to verify that she was started on cephalexin today for 5 doses at dialysis. She dialyzes on Tuesday Thursday and Saturday. 10/2; patient is seen in conjunction with HBO. The area that is draining on the right anterior medial tibia is more extensive. Copious amounts of serosanguineous drainage with some purulence. We are still using the wound VAC on the original wound then it is stable. Culture I did of the original IandD showed MRSA I contacted dialysis she is now on vancomycin  with dialysis treatments. I asked them to run a month 10/9; patient seen in conjunction with HBO. She had a new spontaneous open area just above the wound on the right medial tibia ankle. More swelling on the right medial tibia. Her wound on the foot looks about the same perhaps slightly better. There is no warmth spreading up her leg but no obvious erythema. her MRI of the foot and ankle and distal tib-fib is not booked for next Friday I discussed this with her in great detail over multiple days. it is likely she has spreading infection upper leg at least involving the distal 25% above the ankle. She knows that if I refer her to orthopedics for infectious disease they are going to recommend amputation and indeed I am not against this myself. We had a good trial at trying to heal the foot which is what she wanted along with antibiotics debridement and HBO however she clearly has spreading infection [probably staph aureus/MRSA]. Nevertheless she once again tells me she wants to wait the left of the MRI. She still makes comments about having her amputation done in [place]. 10/19; arrives today with significant swelling on the lateral right leg. Last culture I did showed Klebsiella. Multidrug- resistant. Cipro was intermediate sensitivity and that is what I have her on pending her MRI which apparently is going to be done on Thursday this week although this seems to be moving back and forth. She is not systemically unwell. We are using silver alginate on her major wound area on the right medial foot and the draining areas on the right lateral lower leg 10/26; MRI showed extensive abscess in the anterior compartment of the right leg also widespread osteomyelitis involving osseous structures of the midfoot and portions of the hindfoot. Also suspicion for osteomyelitis anterior aspect of the distal medial malleolus. Culture I did of the purulence once again showed a multidrug-resistant Klebsiella. I  have been in contact with nephrology late last week and she has  been started on cefepime at dialysis to replace the vancomycin We sent a copy of her MRI report to Dr. Geroge Baseman in [place] who is an orthopedic surgeon. The patient takes great stock in his opinion on this. She says she will go to [place] to have her leg amputated if Dr. Geroge Baseman does not feel there is any salvage options. 11/2; she still is not talk to her orthopedic surgeon in [place]. Apparently he will call her at [phone] this afternoon. The quality of this is she has not allowed me to refer her anywhere. She has been told over and over that she needs this amputated but has not agreed to be referred. She tells me her blood sugar was 600 last night but she has not been febrile. 11/9; she never did got a call from the orthopedic surgeon in [place] therefore that is off the radar. We have arranged to get her see orthopedic surgery at [facility]. She still has a lot of draining purulence coming out of the new abscess in her right leg although that probably came from the osteomyelitis in her right foot and heel. Meanwhile the original wound on the right foot looks very healthy. Continued improvement. The issue is that the last MRI showed osteomyelitis in her right foot extensively she now has an abscess in the right anterior lower leg. There is nobody in [place] who will offer this woman anything but an amputation and to be honest that is probably what she needs. I think she still wants to talk about limb salvage although at this point I just do not see that. She has completed her vancomycin at dialysis which was for the original staph aureus she is still on cefepime for the more recent Klebsiella. She has had a long course of both of these antibiotics which should have benefited the osteomyelitis on the right foot as well as the abscess. 11/16; apparently [place] elective surgery is shut down because of COVID-19 pandemic.  I have reached out to some contacts at [facility] to see if we can get her an orthopedic appointment there. I am concerned about continually leaving this but for the moment everything is static. In fact her original large wound on this foot is closing down. It is the abscess on the right anterior leg that continues to drain purulent serosanguineous material. She is not currently on any antibiotics however she had a prolonged course of vancomycin [1 month] as well as cefepime for a month 02/24/2019 on evaluation today patient appears to be doing better than the last time I saw her. This is not a patient that I typically see. With that being said I am covering for Dr. Dellia Nims this week and again compared to when I last saw her overall the wounds in particular seem to be doing significantly better which is good news. With that being said the patient tells me several disconcerting things. She has not been able to get in to see anyone for potential debridement in regard to her leg wounds although she tells me that she does not think it is necessary any longer because she is taking care of that herself. She noticed a string coming out of the lower wound on her leg over the last week. The patient states that she subsequently decided that we must of pack something in there and started pulling the string out and as it kept coming and coming she realized this was likely her tendon. With that being said she continued to remove as much  of this as she could. She then I subsequently proceeded to using tubes of antibiotic ointment which she will stick down into the wound and then scored as much as she can until she sees it coming out of the other wound opening. She states that in doing this she is actually made things better and there is less redness and irritation. With regard to her foot wound she does have some necrotic tendon and tissue noted in one small corner but again the actual wound itself seems to be doing  better with good granulation in general compared to my last evaluation. 12/7; continued improvement in the wound on the substantial part of the right medial foot. Still a necrotic area inferiorly that required debridement but the rest of this looks very healthy and is contracting. She has 2 wounds on the right lateral leg which were her original drainage sites from her abscess but all of this looks a lot better as well. She has been using silver alginate after putting antibiotic biotic ointment in one wound and watching it come out the other. I have talked to her in some detail today. I had given her names of orthopedic surgeons at [facility] for second opinion on what to do about the right leg. I do not think the patient never called them. She has not been able to get a hold of the orthopedic surgeon in [place] that she had put a lot of faith in as being somebody would give her an opinion that she would trust. I talked to her today and said even if I could get her in to another orthopedic surgeon about the leg which she accept an amputation and she said she would not therefore I am not going to press this issue for the moment 12/14; continued improvement in his substantial wound on the right medial foot. There is still a necrotic area inferiorly with tightly adherent necrotic debris which I have been working on debriding each time she is here. She does not have an orthopedic appointment. Since last time she was here I looked over her cultures which were essentially MRSA on the foot wound and gram- negative rods in the abscess on the anterior leg. 12/21; continued improvement in the area on the right medial foot. She is not up on this much and that is probably a good thing since I do not know it could support continuous ambulation. She has a small area on the right lateral leg which were remanence of the IandD's I did because of the abscess. I think she should probably have  prophylactic antibiotics I am going to have to look this over to see if we can make an intelligent decision here. In the meantime her major wound is come down nicely. Necrotic area inferiorly is still there but looks a lot better 04/06/2019; she has had some improvement in the overall surface area on the right medial foot somewhat narrowedr both but somewhat longer. The areas on the right lateral leg which were initial IandD sites are superficial. Nothing is present on the right heel. We are using silver alginate to the wound areas 1/18; right medial foot somewhat smaller. Still a deep probing area in the most distal recess of the wound. She has nothing open on the right leg. She has a new wound on the plantar aspect of her left fourth toe which may have come from just pulling skin. The patient using Medihoney on the wound on her foot under silver alginate. I cannot  discourage her from this Electronic Signature(s) Signed: 04/21/2019 4:08:35 PM By: Linton Ham MD Entered By: Linton Ham on 04/20/2019 14:26:02 -------------------------------------------------------------------------------- Physical Exam Details Patient Name: Date of Service: [name] 04/20/2019 12:45 PM Medical Record Number:[id number] Patient Account Number: [id number] Date of Birth/Sex: Treating RN: 1970/08/13 (49 y.o. F) Primary Care Provider: [name] Other Clinician: Referring Provider: Treating Provider/Extender:Francesa Eugenio, [name], [name] in Treatment: 64 Constitutional Patient is hypertensive.. Pulse regular and within target range for patient.Marland Kitchen Respirations regular, non-labored and within target range.. Temperature is normal and within the target range for the patient.Marland Kitchen Appears in no distress. Cardiovascular Pedal pulses the dorsalis pedis is palpable bilaterally. Integumentary (Hair, Skin) No erythema around the wound. Notes Wound exam; continued improvement. Deep area at the  distal part of the wound on the right dorsal foot. This is not probe all that deeply however. There is nothing open on the right leg New wound on the plantar aspect of the left fourth toe. Electronic Signature(s) Signed: 04/21/2019 4:08:35 PM By: Linton Ham MD Entered By: Linton Ham on 04/20/2019 14:27:25 -------------------------------------------------------------------------------- Physician Orders Details Patient Name: Date of Service: [name] 04/20/2019 12:45 PM Medical Record Number:[id number] Patient Account Number: [id number] Date of Birth/Sex: Treating RN: 1970/09/02 (49 y.o. Nancy Fetter Primary Care Provider: [name] Other Clinician: Referring Provider: Treating Provider/Extender:Dhanush Jokerst, [name], [name] in Treatment: 37 Verbal / Phone Orders: No Diagnosis Coding ICD-10 Coding Code Description M86.671 Other chronic osteomyelitis, right ankle and foot L97.514 Non-pressure chronic ulcer of other part of right foot with necrosis of bone E10.621 Type 1 diabetes mellitus with foot ulcer L97.812 Non-pressure chronic ulcer of other part of right lower leg with fat layer exposed Follow-up Appointments Return Appointment in 2 weeks. Dressing Change Frequency Wound #43 Right,Medial Foot Change dressing three times week. Wound #48 Left Toe Fourth Change dressing three times week. Wound Cleansing Clean wound with Wound Cleanser - Anasept to all wounds Primary Wound Dressing Wound #43 Right,Medial Foot Calcium Alginate with Silver Wound #48 Left Toe Fourth Calcium Alginate with Silver Secondary Dressing Wound #43 Right,Medial Foot Kerlix/Rolled [name] ABD pad Wound #48 Left Toe Fourth Kerlix/Rolled [name] Dry [name] Edema Control Elevate legs to the level of the heart or above for 30 minutes daily and/or when sitting, a frequency of: - throughout the day [facility] skilled [facility] for wound care. -  Interim Electronic Signature(s) Signed: 04/20/2019 6:02:32 PM By: [name] RN, BSN Signed: 04/21/2019 4:08:35 PM By: Linton Ham MD Entered By: [name] on 04/20/2019 13:32:24 -------------------------------------------------------------------------------- Problem List Details Patient Name: Date of Service: [name] 04/20/2019 12:45 PM Medical Record Number:[id number] Patient Account Number: [id number] Date of Birth/Sex: Treating RN: 03-19-1971 (49 y.o. Nancy Fetter Primary Care Provider: [name] Other Clinician: Referring Provider: Treating Provider/Extender:Elroy Schembri, [name], [name] in Treatment: 32 Active Problems ICD-10 Evaluated Encounter Code Description Active Date Today Diagnosis M86.671 Other chronic osteomyelitis, right ankle and foot 09/03/2018 No Yes L97.514 Non-pressure chronic ulcer of other part of right foot 09/03/2018 No Yes with necrosis of bone E10.621 Type 1 diabetes mellitus with foot ulcer 09/24/2018 No Yes L97.521 Non-pressure chronic ulcer of other part of left foot 04/20/2019 No Yes limited to breakdown of skin Inactive Problems ICD-10 Code Description Active Date Inactive Date L97.812 Non-pressure chronic ulcer of other part of right lower leg with 02/24/2019 02/24/2019 fat layer exposed Resolved Problems ICD-10 Code Description Active Date Resolved Date L02.415 Cutaneous abscess of right lower limb 12/25/2018 12/25/2018  Electronic Signature(s) Signed: 04/21/2019 4:08:35 PM By: Linton Ham MD Entered By: Linton Ham on 04/20/2019 14:24:38 -------------------------------------------------------------------------------- Progress Note Details Patient Name: Date of Service: [name] 04/20/2019 12:45 PM Medical Record Number:[id number] Patient Account Number: [id number] Date of Birth/Sex: Treating RN: [birth year] (49 y.o. F) Primary Care Provider: [name] Other Clinician: Referring  Provider: Treating Provider/Extender:Van Ehlert, [name], [name] in Treatment: 32 Subjective History of Present Illness (HPI) 49 year old diabetic who is known to have type 1 diabetes which is poorly controlled last hemoglobin A1c was 11%. She comes in with a ulcerated area on the left lateral foot which has been there for over 6 months. Was recently she has been treated by [name] of podiatry who saw her last on 05/28/2016. Review of his notes revealed that the patient had incision and drainage with placement of antibiotic beads to the left foot on 04/11/2016 for possible osteomyelitis of the cuboid bone. Over the last year she's had a history of amputation of the left fifth toe and a femoropopliteal popliteal bypass graft somewhere in April 2017. 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 His note [name] mentions that the patient has been referred to me for further wound care and possibly great candidate for hyperbaric oxygen therapy due to recurrent osteomyelitis. However we do not have any x-rays of biopsy reports confirming this. He has been on several antibiotics including Bactrim and most recently is on doxycycline for an MRSA. I understand, the patient was not a candidate for IV antibiotics as she has had previous PICC lines which resulted in blood clots in both arms. There was a x-ray report dated 04/04/2016 on [name] notes which showed evidence of fifth ray resection left foot with osteolytic changes noted to the fourth metatarsal and cuboid bone on the left. 06/13/2016 -- had a left foot x-ray which showed no acute fracture or dislocation and no definite radiographic evidence of osteomyelitis. Advanced osteopenia was seen. 06/20/2016 -- she has noticed a new wound on the right plantar foot in the region where she had a callus before. 06/27/16- the patient did have her x-ray of the right foot which showed no findings to suggest osteomyelitis. She  saw her endocrinologist, [name], yesterday. Her A1c in January was 11. He also indicates mismanagement and noncompliance regarding her diabetes. She is currently on Bactrim for a lip infection. She is complaining of nausea, vomiting and diarrhea. She is unable to articulate the exact orders or dosing of the Bactrim; it is unclear when she will complete this. 07/04/2016 -- results from [facility] of ABIs with ankle waveforms were noted from 02/14/2016. The examination done on 06/27/2015 showed noncompressible ABIs with the right being 1.45 and the left being 1.33. The present examination showed a right ABI of 1.19 on the left of 1.33. The conclusion was that right normal ABI in the lower extremity at rest however compared to previous study which was noncompressible ABI may be falsely elevated side suggesting medial calcification. The left ABI suggested medial calcification. 08/01/2016 -- the patient had more redness and pain on her right foot and did not get to come to see as noted she see her PCP or go to the ER and decided to take some leftover metronidazole which she had at home. As usual, the patient does report she feels and is rather noncompliant. 08/08/2016 -- -- x-ray of the right foot -- FINDINGS:Transmetatarsal amputation is noted. No bony destruction is noted to suggest osteomyelitis. IMPRESSION: No evidence of  osteomyelitis. Postsurgical changes are seen. MRI would be more sensitive for possible bony changes. Culture has grown Serratia Marcescens -- sensitive to Bactrim, ciprofloxacin, ceftazidime she was seen by [name] on 08/06/2016. He did not find any exposed bone, muscle, tendon, ligament or joint. There was no malodor and he did a excisional debridement in the office. ============ Old notes: 49 year old patient who is known to the wound clinic for a while had been away from the wound clinic since 09/01/2014. Over the last several months she has been admitted to  various hospitals including [place] [facility]. She was treated for a right metatarsal osteomyelitis with a transmetatarsal amputation and this was done about 2 months ago. He has a small ulcerated area on the right heel and she continues to have an ulcerated area on the left plantar aspect of the foot. The patient was recently admitted to the [facility] hospital group between 7/12 and 10/18/2014. she was given 3 weeks of IV vancomycin and was to follow-up with her surgeons at [facility] and also took oral vancomycin for C. difficile colitis. 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 MRI of the left foot done on 10/14/2014 showed no abscess or osteomyelitis. 04/27/15; this is a patient we know from previous stays in the wound care center. She is a type I diabetic I am not sure of her control currently. Since the last time I saw her she is had a right transmetatarsal amputation and has no wounds on her right foot and has no open wounds. She is been followed at the wound care center at [facility] in [place]. She comes today with the desire to undergo hyperbaric treatment locally. Apparently one of her wound care providers in [place] has suggested hyperbarics. This is in response to an MRI from 04/18/15 that showed increased marrow signal and loss of the proximal fifth metatarsal cortex evidence of osteomyelitis with likely early osteomyelitis in the cuboid bone as well. She has a large wound over the base of the fifth metatarsal. She also has a eschar over her the tips of her toes on 1,3 and 5. She does not have peripheral pulses and apparently is going for an angiogram tomorrow which seems reasonable. After  this she is going to infectious disease at [facility]. They have been using Medihoney to the large wound on the lateral aspect of the left foot to. The patient has known Charcot deformity from diabetic neuropathy. She also has known diabetic PAD. Surprisingly I can't see that she has had any recent antibiotics, the patient states the last antibiotic she had was at the end of November for 10 days. I think this was in response to culture that showed group G strep although I'm not exactly sure where the culture was from. She is also had arterial studies on 03/29/15. This showed a right ABI of 1.4 that was noncompressible. Her left ABI was 0.73. 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 It was not felt that arterial inflow was adequate for healing of a foot ulcer. Her Doppler waveforms looked monophasic ===== READMISSION 02/28/17; this is in an now 49 year old woman we've had at several different occasions in this clinic. 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 She  has a remote ex-smoker. She was last seen in this clinic by Dr. Con Memos I think in May. More recently she is been followed by her podiatrist [name] an infectious disease Dr. Megan Salon. She has 2 open wounds the major one is over the right first metatarsal head she also has a wound on the left plantar foot. an MRI of the right foot on 01/01/17 showed a soft tissue ulcer along the plantar aspect of the first metatarsal base consistent with osteomyelitis of the first metatarsal stump. Dr. 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 According to the patient this is been open for slightly over a month. She has been on a combination of Cipro 500 twice a day, Zyvox 600 twice a day and Flagyl 500 3 times a day for over a month now as directed by Dr. Megan Salon. cultures of the right foot earlier this year  showed MRSA in January and Serratia in May. January also had a few viridans strep. Recent x-rays of both feet were done and [name] office and I don't have these reports. The patient has known PAD and has a history of aleft femoropopliteal bypass in April 2017. 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 Dwyane Dee. In spite of this the last hemoglobin A1c I can see is 10.1 on 01/01/2017. She is being referred by 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 She is been using Medihoney to this area and also an area on the left midfoot. She is using healing sandals bilaterally. ABIs in this clinic at the left posterior tibial was 1.1 noncompressible on the right READMISSION Non invasive vascular NOVANT 5/18 Aftercare following surgery of the circulatory system Procedure Note - Interface, External Ris In - 08/13/2016 11:05 AM EDT Procedure: Examination consists of physiologic resting arterial pressures of the brachial and ankle arteries bilaterally with continuous wave Doppler waveform analysis. Previous: Previous exam performed on 02/14/16 demonstrated ABIs of Rt = 1.19 and Lt = 1.33. Right: ABI = non-compressible PT, 1.47 DP. S/P transmet amputation. Left: ABI = 1.52, 2nd digit pressure = 87 mmHg Conclusions: Right: ABI (>1.3) may be falsely elevated, suggesting medial calcification. Left: ABI (>1.3) may be falsely elevated, suggesting medial calcification The patient is a now 49 year old type I diabetic is had multiple issues her graded to chronic diabetic foot ulcers. 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 We had her in the clinic lastin November. At that point she had  wounds on her bilateral feet.she had wanted to try hyperbarics however the healogics review process denied her because she hadn't followed up with her vascular surgeon for her left femoropopliteal bypass. The bypass was done by Dr. Raul Del at [facility]. We made her a follow-up with Dr. Raul Del however she did not keep the appointment and therefore she was not approved The patient shows me a small wound on her left fourth metatarsal head on her phone. She developed rapid discoloration in the plantar aspect of the left foot and she was admitted to hospital from 2/2 through 05/10/17 with wet gangrene of the left foot osteomyelitis of the fourth metatarsal heads. She was admitted acutely ill with a temperature of 103.  She was started on broad-spectrum vancomycin and cefepime. On 05/06/17 she was taken to the OR by [name] her podiatric surgeon for an incision and drainage irrigation of the left foot wound. Cultures from this surgery revealed group be strep and anaerobes. she was seen by Dr.Xu of orthopedic surgery and scheduled for a below-knee amputation which she u refused. Ultimately she was discharged on Levaquin and Flagyl for one month. MRI 05/05/17 done while she was in the hospital showed abscess adjacent to the fourth metatarsal head and neck small abscess around the fourth flexor tendon. Inflammatory phlegmon and gas in the soft tissues along the lateral aspect of the fourth phalanx. Findings worrisome for osteomyelitis involving the fourth proximal and middle phalanx and also the third and fourth metatarsals. Finally the patient had actually shortly before this followed up with Dr. Raul Del at no time on 04/29/17. He felt that her left femoropopliteal bypass was patent he felt that her left-sided toe pressures more than adequate for healing a wound on the left foot. This was before her acute presentation. 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 05/28/17; she is started hyperbarics. The  patient tells me that for some reason she was not actually on Levaquin but I think on ciprofloxacin. She was on Flagyl. She only started her Levaquin yesterday due to some difficulty with the pharmacy and perhaps her sister picking it up. She has an appointment with [name] tomorrow and with infectious disease early next week. She has no new complaints 06/06/17; the patient continues in hyperbarics. She saw [name] on 05/29/17 who is her podiatric surgeon. He is elected for a transmetatarsal amputation on 06/27/17. I'm not sure at what level he plans to do this amputation. The patient is unaware ooShe also saw Dr. Megan Salon of infectious disease who elected to continue her on current antibiotics I think this is ciprofloxacin and Flagyl. I'll need to clarify with her tomorrow if she actually has this. We're using silver alginate to the actual wound. Necrotic surface today with material under the flap of her foot. ooOriginal MRI showed abscesses as well as osteomyelitis of the proximal and middle fourth phalanx and the third and fourth metatarsal heads 06/11/17; patient continues in hyperbarics and continues on oral antibiotics. She is doing well. The wound looks better. The necrotic part of this under the flap in her superior foot also looks better. she is been to see [name]. I haven't had a chance to look at his note. Apparently he has put the transmetatarsal amputation on hold her request it is still planning to take her to the OR for debridement and product application ACEL. I'll see if I can find his note. I'll therefore leave product ordering/requests to [name] for now. I was going to look at Dermagraft 06/18/17-she is here in follow-up evaluation for bilateral foot wounds. She continues with hyperbaric therapy. She states she has been applying manuka honey to the right plantar foot and alternate manuka honey and silver alginate to the left foot, despite our orders. We will continue with  same treatment plan and she will follo up next week. 06/25/17; I have reviewed [name] last note from 3/11. She has operative debridement in 2 days' time. By review his note apparently they're going to place there is skin over the majority of this wound which is a good choice. She has a small satellite area at the most proximal part of this wound on the left plantar foot. The area on the right  plantar foot we've been using silver alginate and it is close to healing. 07/02/17; unfortunately the patient was not easily approved for [name] proposed surgery. I'm not completely certain what the issue is. She has been using silver alginate to the wound she has completed a first course of hyperbarics. She is still on Levaquin and Flagyl. I have really lost track of the time course here.I suspect she should have another week to 2 of antibiotics. I'll need to see if she is followed up with infectious disease Dr. Megan Salon 07/09/17; the patient is followed up with Dr. Megan Salon. 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 She continues on Levaquin and metronidazole continuing both of these for now I think she is been on fr about 6 weeks. She still has some drainage but no pain. No fever. Her had been plans for her to go to the OR for operative debridement with her podiatrist [name], I am not exactly sure where that is. I'll probably slip a note to [name] today. I note that she follows with Dr. Dwyane Dee of endocrinology. We have her recertified for hyperbaric oxygen. I have not heard about Dermagraft however I'll see if [name] is planning a skin substitute as well 07/16/17; the patient tells me she is just about out of [place]. I'll need to check Dr. Hale Bogus last notes on this. She states she has plenty of Flagyl however. She comes in today complaining of pain in the right lateral foot which she said lasted for about a day. The wound on the right foot is  actually much more medially. She also tells me that the [facility] cost a lot of pain in the left foot wound and she turned back to silver alginate. Finally Dermagraft has a $903 per application co-pay. She cannot afford this 07/23/17; patient arrives today with the wound not much smaller. There is not much new to add. She has not heard from [name] all try to put in a call to them today. She was asking about Dermagraft again and she has an over $009 per application co-pay she states that she would be willing to try to do a payment plan. I been tried to avoid this. We've been using silver alginate, I'll change to [facility] 07/30/17-She is here in follow-up evaluation for left foot ulcer. She continues hyperbaric medicine. The left foot ulcer is stable we will continue with same treatment plan 08/06/17; she is here for evaluation of her left foot ulcer. Currently being treated for hyperbarics or underlying osteomyelitis. She is completed antibiotics. The left foot ulcer is better smaller with healthier looking granulation. For various reasons I am not really clear on we never got her back to the OR with [name]. He did not respond to my secure text message. Nevertheless I think that surgery on this point is not necessary nor am I completely clear that a skin substitute is necessary The patient is complaining about pain on the outside of her right foot. 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 There is no erythema. She also states the foot is warm versus her other part of her upper leg and this is largely true. It is not totally clear to me what's causing this. She thinks it's different from her usual neuropathy pain 08/13/17; she arrives in clinic today with a small wound which is superficial on her right first metatarsal head. 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 She tells [place] she was up on her feet over the Mother's Day celebration. ooThe large  wound is on the left foot. Continues with hyperbarics for underlying osteomyelitis. We're using Hydrofera Blue. She asked me today about where we were with Dermagraft. I had actually excluded this because of the co- pay however she wants to assume this therefore I'll recheck the co-pay an order for next week. 08/20/17; the patient agreed to accept the co-pay of the first Apligraf which we applied today. She is disappointed she is finishing hyperbarics will run this through the insurance on the extent of the foot infection and the extent of the wound that she had however she is already had 60 dive's. Dermagraft No. 1 08/27/17; Dermagraft No. 2. She is not eligible for any more hyperbaric treatments this month. She reports a fair amount of drainage and she actually changed to the external dressings without disturbing the direct contact layer 09/03/17; the patient arrived in clinic today with the wound superficially looking quite healthy. Nice vibrant red tissue with some advancing epithelialization although not as much adherence of the flap as I might like. However she noted on her own fourth toe some bogginess and she brought that to our attention. Indeed this was boggy feeling like a possibility of subcutaneous fluid. 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 She is not been unwell. We've been using Dermagraft 09/10/17; the culture that I did not last week was MRSA. She saw Dr. Megan Salon this morning who is going to start her on vancomycin. I had sent him a secure a text message yesterday. I also spoke with her podiatric surgeon [name] about surgery on this foot the options for conserving a functional foot etc. Promised me he would see her and will make back consultation today. Paradoxically her actual wound on the plantar aspect of her left foot looks really quite good. I had given her 5 days worth of Baxdella to cover her for  MRSA. Her MRI came back showing osteomyelitis within the third metatarsal shaft and head and base of the third and fourth proximal phalanx. She had extensive inflammatory changes throughout the soft tissue of the lateral forefoot. With an ill-defined fluid around the fourth metatarsal extending into the plantar and dorsal soft tissues 09/19/17; the patient is actually on oral Septra and Flagyl. She apparently refused IV vancomycin. She also saw [name] at my request who is planning her for a left BKA sometime in mid July. MRI showed osteomyelitis within the third metatarsal shaft and head and the basis of the third and fourth proximal phalanx. I believe there was felt to be possible septic arthritis involving the third MTP. 09/26/17; the patient went back to Dr. Megan Salon at my suggestion and is now receiving IV daptomycin. Her wound continues to look quite good making the decision to proceed with a transmetatarsal amputation although more difficult for the patient. I believe in my extensive discussions with her she has a good sense of the pros and cons of this. I don't NV the tuft decision she has to make. She has an appointment with [name] I believe in mid July and I previously spoken to him about this issue Has we had used 3 previous Dermagraft. Given the condition of the wound surface I went ahead and added the fourth one today area and I did this not fully realizing that she'll be  traveling to [place] next week. I'm hopeful she can come back in 2 weeks 10/21/17; Her same Dermagraft on for about 3-1/2 weeks. In spite of this the wound arrives looking quite healthy. There is been a lot of healing dimensions are smaller. Looking at the square shaped wound she has now there is some undermining and some depth medially under the undermining although I cannot palpate any bone. No surrounding infection is obvious. She has difficult questions about how to look at this going forward vis--vis  amputations versus continued medical therapy. To be truthful the wound is looks so healthy and it is continued to contract. Hard to justify foot surgery at this point although I still told her that I think it might come to that if we are not able to eradicate the underlying MRSA. She is still highly at risk and she understands this 11/06/17 on evaluation today patient appears to be doing better in regard to her foot ulcer. She's been tolerating the dressing changes without complication. Currently she is here for her Dermagraft #6. Her wound continues to make excellent progress at this point. She does not appear to have any evidence of infection which is good news. 11/13/17 on evaluation today patient appears to be doing excellent at this time. She is here for repeat Dermagraft application. This is #7. Overall her wound seems to be making great progress. 12/05/17; the patient arrives with the wound in much better condition than when I last saw this almost 6 weeks ago. She still has a small probing area in the left metatarsal head region on the lateral aspect of her foot. We applied her last Dermagraft today. ooSince the last time she is here she has what appears to a been a blood blister on the plantar aspect of left foot although I don't see this is threatening. There is also a thick raised tissue on the right mid metatarsal head region. This was not there I don't think the last time she was here 3 weeks ago. 12/12/17; the patient continues to have a small programming area in the left metatarsal head region on the lateral aspect of her foot which was the initial large surgical wound. I applied her last Apligraf last week. I'm going to use Endoform starting today ooUnfortunately she has an excoriated area in the left mid foot and the right mid foot. The left midfoot looks like a blistered area this was not opened last week it certainly is open today. Using silver alginate on these areas. She promises  me she is offloading this. 12/19/17; the small probing area in the left metatarsal head eyes think is shallower. In general her original wound looks better. We've been using Endoform. The area inferiorly that I think was trauma last week still requires debridement a lot of nonviable surface which I removed. She still has an open open area distally in her foot ooSimilarly on the right foot there is tightly adherent surface debris which I removed. Still areas that don't look completely epithelialized. This is a small open area. We used silver alginate on these areas 12/26/2017; the patient did not have the supplies we ordered from last week including the Endoform. The original large wound on the left lateral foot looks healthy. She still has the undermining area that is largely unchanged from last week. She has the same heavily callused raised edged wounds on the right mid and left midfoot. Both of these requiring debridement. We have been using silver alginate on these areas 01/02/2018;  there is still supply issues. We are going to try to use Prisma but I am not sure she actually got it from what she is saying. She has a new open area on the lateral aspect of the left fourth toe [previous fifth ray amputation]. Still the one tunneling area over the fourth metatarsal head. The area is in the midfoot bilaterally still have thick callus around them. She is concerned about a raised swelling on the lateral aspect of the foot. However she is completely insensate 01/10/2018; we are using Prisma to the wounds on her bilateral feet. Surprisingly the tunneling area over the left fourth metatarsal head that was part of her original surgery has closed down. She has a small open area remaining on the incision line. 2 open areas in the midfoot. 02/10/2018; the patient arrives back in clinic after a month hiatus. She was traveling to visit family in [place]. Is fairly clear she was not offloading the areas on her  feet. The original wound over the left lateral foot at the level of metatarsal heads is reopened and probes medially by about a centimeter or 2. She notes that a week ago she had purulent drainage come out of an area on the left midfoot. Paradoxically the worst area is actually on the right foot is extensive with purulent drainage. We will use silver alginate today 02/17/2018; the patient has 3 wounds one over the left lateral foot. She still has a small area over the metatarsal heads which is the remnant of her original surgical wound. This has medial probing depth of roughly 1.4 cm somewhat better than last week. The area on the right foot is larger. We have been using silver alginate to all areas. The area on the right foot and left foot that we cultured last week showed both Klebsiella and Proteus. Both of these are quinolone sensitive. The patient put her's self on Bactrim and Flagyl that she had left hanging around from prior antibiotic usages. She was apparently on this last week when she arrived. I did not realize this. Unfortunately the Bactrim will not cover either 1 of these organisms. We will send in Cipro 500 twice daily for a week 03/04/2018; the patient has 2 wounds on the left foot one is the original wound which was a surgical wound for a deep DFU. At one point this had exposed bone. She still has an area over the fourth metatarsal head that probes about 1.4 cm although I think this is better than last week. I been using silver nitrate to try and promote tissue adherence and been using silver alginate here. ooShe also has an area in the left midfoot. This has some depth but a small linear wound. Still requiring debridement. ooOn the right midfoot is a circular wound. A lot of thick callus around this area. ooWe have been using silver alginate to all wound areas ooShe is completed the ciprofloxacin I gave her 2 weeks ago. 03/11/2018; the patient continues to have 2 open areas on  the left foot 1 of which was the original surgical wound for a deep DFU. Only a small probing area remains although this is not much different from last week we have been using silver alginate. The other area is on the midfoot this is smaller linear but still with some depth. We have been using silver alginate here as well ooOn the right foot she has a small circular wound in the mid aspect. This is not much smaller than last  time. We have been using silver alginate here as well 03/18/2018; she has 3 wounds on the left foot the original surgical wound, a very superficial wound in the mid aspect and then finally the area in the mid plantar foot. She arrives in today with a very concerning area in the wound in the mid plantar foot which is her most proximal wound. There is undermining here of roughly 1-1/2 cm superiorly. Serosanguineous drainage. She tells me she had some pain on for over the weekend that shot up her foot into her thigh and she tells me that she had a nodule in the groin area. ooShe has the single wound in the right foot. ooWe are using endoform to both wound areas 03/24/2018; the patient arrives with the original surgical wound in the area on the left midfoot about the same as last week. There is a collection of fluid under the surface of the skin extending from the surgical wound towards the midfoot although it does not reach the midfoot wound. The area on the right foot is about the same. Cultures from last week of the left midfoot wound showed abundant Klebsiella abundant Enterococcus faecalis and moderate methicillin resistant staph I gave her Levaquin but this would have only covered the Klebsiella. She will need linezolid 04/01/2018; she is taking linezolid but for the first few days only took 1 a day. I have advised her to finish this at twice daily dosing. In any case all of her wounds are a lot better especially on the left foot. The original surgical wound is closed.  The area on the left midfoot considerably smaller. The area on the right foot also smaller. 04/08/2018; her original surgical wound/osteomyelitis on the left foot remains closed. She has area on the left foot that is in the midfoot area but she had some streaking towards this. This is not connected with her original wound at least not visually. ooSmall wound on the right midfoot appears somewhat smaller. 04/15/18; both wounds looks better. Original wound is better left midfoot. Using silver alginate 1/21; patient states she uses saltwater soak in, stones or remove callus from around her wounds. She is also concerned about a blood blister she had on the left foot but it simply resolved on its own. We've been using silver alginate 1/28; the patient arrives today with the same streaking area from her metatarsals laterally [the site of her original surgical wound] down to the middle of her foot. There is some drainage in the subcutaneous area here. This concerns me that there is actually continued ongoing infection in the metatarsals probably the fourth and third. This fixates an MRI of the foot without contrast [chronic renal failure] ooThe wound in the mid part of the foot is small but I wonder whether this area actually connects with the more distal foot. ooThe area on the right midfoot is probably about the same. Callus thick skin around the small wound which I removed with a curette we have been using silver alginate on both wound areas 2/4; culture I did of the draining site on the left foot last time grew methicillin sensitive staph aureus. MRI of the left foot showed interval resolution of the findings surrounding the third metatarsal joint on the prior study consistent with treated osteomyelitis. Chronic soft tissue ulceration in the plantar and lateral aspect of the forefoot without residual focal fluid collection. No evidence of recurrent osteomyelitis. Noted to have the previous  amputation of the distal first phalanx and fifth  ray MRI of the right foot showed no evidence of osteomyelitis I am going to treat the patient with a prolonged course of antibiotics directed against MSSA in the left foot 2/11; patient continues on cephalexin. She tells me she had nausea and vomiting over the weekend and missed 2 days. In general her foot looks much the same. She has a small open area just below the left fourth metatarsal head. A linear area in the left midfoot. Some discoloration extending from the inferior part of this into the left lateral foot although this appears to be superficial. She has a small area on the right midfoot which generally looks smaller after debridement 2/18; the patient is completing his cephalexin and has another 2 days. She continues to have open areas on the left and right foot. 2/25; she is now off antibiotics. The area on the left foot at the site of her original surgical wound has closed yet again. She still has open areas in the mid part of her foot however these appear smaller. The area on the right mid foot looks about the same. We have been using silver alginate She tells me she had a serious hypoglycemic spell at home. She had to have EMS called and get IV dextrose 3/3; disappointing on the left lateral foot large area of necrotic tissue surrounding the linear area. This appears to track up towards the same original surgical wound. Required extensive debridement. The area on the right plantar foot is not a lot better also using silver 3/12; the culture I did last time showed abundant enterococcus. I have prescribed Augmentin, should cover any unrecognized anaerobes as well. In addition there were a few MRSA and Serratia that would not be well covered although I did not want to give her multiple antibiotics. She comes in today with a new wound in the right midfoot this is not connected with the original wound over her MTP a lot of thick callus  tissue around both wounds but once again she said she is not walking on these areas 3/17-Patient comes in for follow-up on the bilateral plantar wounds, the right midfoot and the left plantar wound. Both these are heavily callused surrounding the wounds. We are continuing to use silver alginate, she is compliant with offloading and states she uses a wheelchair fairly often at home 3/24; both wound areas have thick callus. However things actually look quite a bit better here for the majority of her left foot and the right foot. 3/31; patient continues to have thick callused somewhat irritated looking tissue around the wounds which individually are fairly superficial. There is no evidence of surrounding infection. We have been using silver alginate however I change that to [facility] today 4/17; patient returns to clinic after having a scare with Covid she tested negative in her primary doctor's office. She has been using Hydrofera Blue. She does not have an open area on the right foot. On the left foot she has a small open area with the mid area not completely viable. She showed me pictures of what looks like a hemorrhagic blister from several days ago but that seems to have healed over this was on the lateral left foot 4/21; patient comes in to clinic with both her wounds on her feet closed. However over the weekend she started having pain in her right foot and leg up into the thigh. She felt as though she was running a low-grade fever but did not take her temperature. She took a  doxycycline that she had leftover and yesterday a single Septra and metronidazole. She thinks things feel somewhat better. 4/28; duplex ultrasound I ordered last week was negative for DVT or superficial thrombophlebitis. She is completed the doxycycline I gave her. States she is still having a lot of pain in the right calf and right ankle which is no better than last week. She cannot sleep. She also states she has a  temperature of up to 101, coughing and complaining of visual loss in her bilateral eyes. Apparently she was tested for Covid 2 weeks ago at [facility] and that was negative. Readmission: 09/03/18 patient presents back for reevaluation after having been evaluated at the end of April regarding erythema and swelling of her right lower extremity. Subsequently she ended up going to the hospital on 07/29/18 and was admitted not to be discharged until 08/08/18. Unfortunately it was noted during the time that she was in the hospital that she did have methicillin-resistant Staphylococcus aureus as the infection noted at the site. It was also determined that she did have osteomyelitis which appears to be fairly significant. She was treated with vancomycin and in fact is still on IV vancomycin at dialysis currently. This is actually slated to continue until 09/12/18 at least which will be the completion of the six weeks of therapy. Nonetheless based on what I'm seeing at this point I'm not sure she will be anywhere near ready to discontinue antibiotics at that time. Since she was released from the hospital she was seen by [name] who is her podiatrist on 08/27/18. His note specifically states that he is recommended that the patient needs of one knee amputation on the right as she has a life-threatening situation that can lead quickly to sepsis. The patient advised she would like to try to save her leg to which [name] apparently told her that this was against all medical advice. She also want to discontinue the Wound VAC which had been initiated due to the fact that she wasn't pleased with how the wound was looking and subsequently she wanted to pursue applying Medihoney at that time. He stated that he did not believe that the right lower extremity was salvageable and that the patient understood but would still like to attempt hyperbaric option therapy if it could be of any benefit. She was therefore referred back  to [place] for further evaluation. He plans to see her back next week. Upon inspection today patient has a significant amount purulent drainage noted from the wound at this point. The bone in the distal portion of her foot also appears to be extremely necrotic and spongy. When I push down on the bone it bubbles and seeps purulent drainage from deeper in the end of the foot. I do not think that this is likely going to heal very well at all and less aggressive surgical debridement were undertaken more than what I believe we can likely do here in our office. 09/12/2018; I have not seen this patient since the most recent hospitalization although she was in our clinic last week. I have reviewed some of her records from a complex hospitalization. 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 There is situation was complicated by MRSA bacteremia and acute on chronic renal failure now on dialysis. She is receiving vancomycin at dialysis. We started her on Dakin's wet-to- dry last week she is changing this daily. There is still purulent drainage coming out of her foot.  Although she is apparently "agreeable" to a below-knee amputation which is been suggested by multiple clinicians she wants this to be done in [place]. She apparently has a telehealth visit with that provider sometime in late [place] 6/24. I have told her I think this is probably too long. Nevertheless I could not convince her to allow a local doctor to perform BKA. 09/19/2018; the patient has a large necrotic area on the right anterior foot. She has had previous transmetatarsal amputations. Culture I did last week showed MRSA nothing else she is on vancomycin at dialysis. She has continued leaking purulent drainage out of the distal part of the large circular wound on the right anterior foot. She apparently went to see Dr. 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  Somehow that translated into her being referred to plastic surgery for debridement of the area. I gather she basically refused amputation although I do not have a copy of Dr. Berenice Primas notes. The patient really wants to have a trial of hyperbaric oxygen. I agreed with initial assessment in this clinic that this was probably too far along to benefit however if she is going to have plastic surgery I think she would benefit from ancillary hyperbaric oxygen. The issue here is that the patient has benefited as maximally as any patient I have ever seen from hyperbaric oxygen therapy. Most recently she had exposed bone on the lateral part of her left foot after a surgical procedure and that actually has closed. She has eschared areas in both heels but no open area. She is remained systemically well. I am not optimistic that anything can be done about this but the patient is very clear that she wants an attempt. The attempt would include a wound VAC further debridements and hyperbaric oxygen along with IV antibiotics. 6/26; I put her in for a trial of hyperbaric oxygen only because of the dramatic response she has had with wounds on her left midfoot earlier this year which was a surgical wound that went straight to her bone over the metatarsal heads and also remotely the left third toe. We will see if we can get this through our review process and insurance. She arrives in clinic with again purulent material pouring out of necrotic bone on the top of the foot distally. There is also some concerning erythema on the front of the leg that we marked. It is bit difficult to tell how tender this is because of neuropathy. I note from infectious disease that she had her vancomycin extended. 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 She had the wound VAC on for part of the week. The rest of the time she is putting various things on this including Medihoney, "ionized water" silver sorb  gel etc. 7/7; follow-up along with HBO. She is still on vancomycin at dialysis. She has a large open area on the dorsal right foot and a small dark eschar area on her heel. There is a lot less erythema in the area and a lot less tenderness. From an infection point of view I think this is better. She still has a lot of necrosis in the remaining right forefoot [previous TMA] we are still using the wound VAC in this area 7/16; follow-up along with HBO. I put her on linezolid after she finished her vancomycin. We started this last Friday I gave her 2 weeks worth. I had the expectation that she would be operatively debrided by Dr. Marla Roe  but that still has not happened yet. Patient phoned the office this week. She arrives for review today after HBO. The distal part of this wound is completely necrotic. Nonviable pieces of tendon bone was still purulent drainage. Also concerning that she has black eschar over the heel that is expanding. I think this may be indicative of infection in this area as well. She has less erythema and warmth in the ankle and calf but still an abnormal exam 7/21 follow-up along with HBO. I will renew her linezolid after checking a CBC with differential monitoring her blood counts especially her platelets. She was supposed to have surgery yesterday but if I am reading things correctly this was canceled after her blood sugar was found to be over 500. I thought Dr. Marla Roe who called me said that they were sending her to the ER but the patient states that was not the case. 7/28. Follow-up along with HBO. She is on linezolid I still do not have any lab work from dialysis even though I called last week. The patient is concerned about an area on her left lateral foot about the level of the base of her fifth metatarsal. I did not really see anything that ominous here however this patient is in [place] ability to point out problems that she is sensing and she has been accurate in  the past Finally she received a call from Dr. Marla Roe who is referring her to another orthopedic surgeon stating that she is too booked up to take her to the operating room now. Was still using a wound VAC on the foot 8/3 -Follow-up after HBO, she is got another week of linezolid, she is to call [facility] for an appointment, x-rays of both feet were reviewed, the left foot x-ray with third MTP joint osteo- Right foot x-ray widespread osteo-in the right midfoot Right ankle x-ray does not show any active evidence of infection 8/11-Patient is seen after HBO, the wounds on the right foot appear to be about the same, the heel wound had some necrotic base over tendon that was debrided with a curette 8/21; patient is seen after HBO. The patient's wound on her dorsal foot actually looks reasonably good and there is substantial amount of epithelialization however the open area distally still has a lot of necrotic debris partially bone. I cannot really get a good sense of just how deep this probes under the foot. She has been pressuring me this week to order medical maggots through a company in [place] for her. The problem I have is there is not a defined wound area here. On the positive side there is no purulence. She has been to see infectious disease she is still on Septra DS although I have not had a chance to review their notes 8/28; patient is seen in conjunction with HBO. The wounds on her foot continued to improve including the right dorsal foot substantially the, the distal part of this wound and the area on the right heel. We have been using a wound VAC over this chronically. She is still on trimethoprim as directed by infectious disease 9/4; patient is seen in conjunction with HBO. Right dorsal foot wound substantially anteriorly is better however she continues to have a deep wound in the distal part of this that is not responding. We have been using silver collagen under border foam ooArea on  the right plantar medial heel seems better. We have been using Hydrofera Blue 12/12/18 on evaluation today patient appears to be  doing about the same with regard to her wound based on prior measurements. She does have some necrotic tissue noted on the lateral aspect of the wound that is going require a little bit of sharp debridement today. This includes what appears to be potentially either severely necrotic bone or tendon. Nonetheless other than that she does not appear to have any severe infection which is good news 9/18; it is been 2 weeks since I saw this wound. She is tolerating HBO well. Continued dramatic improvement in the area on the right dorsal foot. She still has a small wound on the heel that we have been using Hydrofera Blue. She continues with a wound VAC 9/24; patient has to be seen emergently today with a swelling on her right lateral lower leg. She says that she told Dr. Evette Doffing about this and also myself on a couple of occasions but I really have no recollection of this. She is not systemically unwell and her wound really looked good the last time I saw this. She showed this to providers at dialysis and she was able to verify that she was started on cephalexin today for 5 doses at dialysis. She dialyzes on Tuesday Thursday and Saturday. 10/2; patient is seen in conjunction with HBO. The area that is draining on the right anterior medial tibia is more extensive. Copious amounts of serosanguineous drainage with some purulence. We are still using the wound VAC on the original wound then it is stable. Culture I did of the original IandD showed MRSA I contacted dialysis she is now on vancomycin with dialysis treatments. I asked them to run a month 10/9; patient seen in conjunction with HBO. She had a new spontaneous open area just above the wound on the right medial tibia ankle. More swelling on the right medial tibia. Her wound on the foot looks about the same perhaps slightly  better. There is no warmth spreading up her leg but no obvious erythema. her MRI of the foot and ankle and distal tib-fib is not booked for next Friday I discussed this with her in great detail over multiple days. it is likely she has spreading infection upper leg at least involving the distal 25% above the ankle. She knows that if I refer her to orthopedics for infectious disease they are going to recommend amputation and indeed I am not against this myself. We had a good trial at trying to heal the foot which is what she wanted along with antibiotics debridement and HBO however she clearly has spreading infection [probably staph aureus/MRSA]. Nevertheless she once again tells me she wants to wait the left of the MRI. She still makes comments about having her amputation done in [place]. 10/19; arrives today with significant swelling on the lateral right leg. Last culture I did showed Klebsiella. Multidrug- resistant. Cipro was intermediate sensitivity and that is what I have her on pending her MRI which apparently is going to be done on Thursday this week although this seems to be moving back and forth. She is not systemically unwell. We are using silver alginate on her major wound area on the right medial foot and the draining areas on the right lateral lower leg 10/26; MRI showed extensive abscess in the anterior compartment of the right leg also widespread osteomyelitis involving osseous structures of the midfoot and portions of the hindfoot. Also suspicion for osteomyelitis anterior aspect of the distal medial malleolus. Culture I did of the purulence once again showed a multidrug-resistant Klebsiella. I have  been in contact with nephrology late last week and she has been started on cefepime at dialysis to replace the vancomycin We sent a copy of her MRI report to Dr. Geroge Baseman in [place] who is an orthopedic surgeon. The patient takes great stock in his opinion on this. She says she  will go to [place] to have her leg amputated if Dr. Geroge Baseman does not feel there is any salvage options. 11/2; she still is not talk to her orthopedic surgeon in [place]. Apparently he will call her at [phone] this afternoon. The quality of this is she has not allowed me to refer her anywhere. She has been told over and over that she needs this amputated but has not agreed to be referred. She tells me her blood sugar was 600 last night but she has not been febrile. 11/9; she never did got a call from the orthopedic surgeon in [place] therefore that is off the radar. We have arranged to get her see orthopedic surgery at [facility]. She still has a lot of draining purulence coming out of the new abscess in her right leg although that probably came from the osteomyelitis in her right foot and heel. Meanwhile the original wound on the right foot looks very healthy. Continued improvement. The issue is that the last MRI showed osteomyelitis in her right foot extensively she now has an abscess in the right anterior lower leg. There is nobody in [place] who will offer this woman anything but an amputation and to be honest that is probably what she needs. I think she still wants to talk about limb salvage although at this point I just do not see that. She has completed her vancomycin at dialysis which was for the original staph aureus she is still on cefepime for the more recent Klebsiella. She has had a long course of both of these antibiotics which should have benefited the osteomyelitis on the right foot as well as the abscess. 11/16; apparently [place] elective surgery is shut down because of COVID-19 pandemic. I have reached out to some contacts at [facility] to see if we can get her an orthopedic appointment there. I am concerned about continually leaving this but for the moment everything is static. In fact her original large wound on this foot is closing down. It is the abscess on the  right anterior leg that continues to drain purulent serosanguineous material. She is not currently on any antibiotics however she had a prolonged course of vancomycin [1 month] as well as cefepime for a month 02/24/2019 on evaluation today patient appears to be doing better than the last time I saw her. This is not a patient that I typically see. With that being said I am covering for Dr. Dellia Nims this week and again compared to when I last saw her overall the wounds in particular seem to be doing significantly better which is good news. With that being said the patient tells me several disconcerting things. She has not been able to get in to see anyone for potential debridement in regard to her leg wounds although she tells me that she does not think it is necessary any longer because she is taking care of that herself. She noticed a string coming out of the lower wound on her leg over the last week. The patient states that she subsequently decided that we must of pack something in there and started pulling the string out and as it kept coming and coming she realized this was likely  her tendon. With that being said she continued to remove as much of this as she could. She then I subsequently proceeded to using tubes of antibiotic ointment which she will stick down into the wound and then scored as much as she can until she sees it coming out of the other wound opening. She states that in doing this she is actually made things better and there is less redness and irritation. With regard to her foot wound she does have some necrotic tendon and tissue noted in one small corner but again the actual wound itself seems to be doing better with good granulation in general compared to my last evaluation. 12/7; continued improvement in the wound on the substantial part of the right medial foot. Still a necrotic area inferiorly that required debridement but the rest of this looks very healthy and is  contracting. She has 2 wounds on the right lateral leg which were her original drainage sites from her abscess but all of this looks a lot better as well. She has been using silver alginate after putting antibiotic biotic ointment in one wound and watching it come out the other. I have talked to her in some detail today. I had given her names of orthopedic surgeons at [facility] for second opinion on what to do about the right leg. I do not think the patient never called them. She has not been able to get a hold of the orthopedic surgeon in [place] that she had put a lot of faith in as being somebody would give her an opinion that she would trust. I talked to her today and said even if I could get her in to another orthopedic surgeon about the leg which she accept an amputation and she said she would not therefore I am not going to press this issue for the moment 12/14; continued improvement in his substantial wound on the right medial foot. There is still a necrotic area inferiorly with tightly adherent necrotic debris which I have been working on debriding each time she is here. She does not have an orthopedic appointment. Since last time she was here I looked over her cultures which were essentially MRSA on the foot wound and gram- negative rods in the abscess on the anterior leg. 12/21; continued improvement in the area on the right medial foot. She is not up on this much and that is probably a good thing since I do not know it could support continuous ambulation. She has a small area on the right lateral leg which were remanence of the IandD's I did because of the abscess. I think she should probably have prophylactic antibiotics I am going to have to look this over to see if we can make an intelligent decision here. In the meantime her major wound is come down nicely. Necrotic area inferiorly is still there but looks a lot better 04/06/2019; she has had some improvement in the overall  surface area on the right medial foot somewhat narrowedr both but somewhat longer. The areas on the right lateral leg which were initial IandD sites are superficial. Nothing is present on the right heel. We are using silver alginate to the wound areas 1/18; right medial foot somewhat smaller. Still a deep probing area in the most distal recess of the wound. She has nothing open on the right leg. She has a new wound on the plantar aspect of her left fourth toe which may have come from just pulling skin. The patient using  Medihoney on the wound on her foot under silver alginate. I cannot discourage her from this Objective Constitutional Patient is hypertensive.. Pulse regular and within target range for patient.Marland Kitchen Respirations regular, non-labored and within target range.. Temperature is normal and within the target range for the patient.Marland Kitchen Appears in no distress. Vitals Time Taken: 1:05 PM, Height: 67 in, Source: Stated, Weight: 125 lbs, Source: Stated, BMI: 19.6, Temperature: 98.4 F, Pulse: 93 bpm, Respiratory Rate: 18 breaths/min, Blood Pressure: 160/104 mmHg, Capillary Blood Glucose: 153 mg/dl. General Notes: glucose per pt report last night Cardiovascular Pedal pulses the dorsalis pedis is palpable bilaterally. General Notes: Wound exam; continued improvement. Deep area at the distal part of the wound on the right dorsal foot. This is not probe all that deeply however. ooThere is nothing open on the right leg ooNew wound on the plantar aspect of the left fourth toe. Integumentary (Hair, Skin) No erythema around the wound. Wound #43 status is Open. Original cause of wound was Gradually Appeared. The wound is located on the Right,Medial Foot. The wound measures 5.8cm length x 2.8cm width x 0.7cm depth; 12.755cm^2 area and 8.928cm^3 volume. There is muscle, tendon, and Fat Layer (Subcutaneous Tissue) Exposed exposed. There is no tunneling or undermining noted. There is a medium amount of  purulent drainage noted. The wound margin is flat and intact. There is small (1-33%) pink, friable granulation within the wound bed. There is a large (67-100%) amount of necrotic tissue within the wound bed including Adherent Slough and Necrosis of Muscle. Wound #47 status is Open. Original cause of wound was Gradually Appeared. The wound is located on the Right,Anterior Lower Leg. The wound measures 0cm length x 0cm width x 0cm depth; 0cm^2 area and 0cm^3 volume. There is no tunneling or undermining noted. There is a none present amount of drainage noted. The wound margin is distinct with the outline attached to the wound base. There is no granulation within the wound bed. There is no necrotic tissue within the wound bed. Wound #48 status is Open. Original cause of wound was Trauma. The wound is located on the Left Toe Fourth. The wound measures 0.3cm length x 0.4cm width x 0.1cm depth; 0.094cm^2 area and 0.009cm^3 volume. There is Fat Layer (Subcutaneous Tissue) Exposed exposed. There is no tunneling or undermining noted. There is a small amount of serosanguineous drainage noted. The wound margin is flat and intact. There is large (67-100%) red granulation within the wound bed. There is no necrotic tissue within the wound bed. 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 Dressing Change Frequency: Wound #43 Right,Medial Foot: Change dressing three times week. Wound #48 Left Toe Fourth: Change dressing three times week. Wound Cleansing: Clean wound with Wound Cleanser - Anasept to all wounds Primary Wound Dressing: Wound #43 Right,Medial Foot: Calcium Alginate with Silver Wound #48 Left  Toe Fourth: Calcium Alginate with Silver Secondary Dressing: Wound #43 Right,Medial Foot: Kerlix/Rolled [name] ABD pad Wound #48 Left Toe Fourth: Kerlix/Rolled [name] Dry [name] Edema Control: Elevate legs to the level of the heart or above for 30 minutes daily and/or when sitting, a frequency of: - throughout the day Home Health: [facility] for wound care. - Interim 1. We continue with silver  alginate to the right dorsal foot and left fourth toe 2. She is tolerating the trimethoprim sulfamethoxazole I put her on last time 1 a day including postdialysis on dialysis days for prophylaxis. 3. She has underlying osteomyelitis in the right foot. She is aware of this. We have made dramatic improvements in the leg and the wounds in the right foot although this could reopen at any time 4. The abscess she has on the right leg has resolved. 5. Follow-up in 2 weeks Electronic Signature(s) Signed: 04/21/2019 4:08:35 PM By: Linton Ham MD Entered By: Linton Ham on 04/20/2019 14:28:43 -------------------------------------------------------------------------------- SuperBill Details Patient Name: Date of Service: [name] 04/20/2019 Medical Record Number:[id number] Patient Account Number: [id number] Date of Birth/Sex: Treating RN: [birth year] (48 y.o. Nancy Fetter Primary Care Provider: [name] Other Clinician: Referring Provider: Treating Provider/Extender:Asharia Lotter, [name], [name] in Treatment: 32 Diagnosis Coding ICD-10 Codes Code Description [phone] Other chronic osteomyelitis, right ankle and foot L97.514 Non-pressure chronic ulcer of other part of right foot with necrosis of bone E10.621 Type 1 diabetes mellitus with foot ulcer L97.521 Non-pressure chronic ulcer of other part of left foot limited to breakdown of skin Facility Procedures CPT4 Code: 15726203 Description: 99214 - WOUND CARE VISIT-LEV 4 EST  PT Modifier: Quantity: 1 Physician Procedures CPT4 Code Description: [id number] - WC PHYS LEVEL 3 - EST PT ICD-10 Diagnosis Description 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

## 2019-04-21 NOTE — Progress Notes (Signed)
BRYLINN, TEANEY ([id number]) Visit Report for 04/20/2019 Arrival Information Details Patient Name: Date of Service: ARDRA, KUZNICKI 04/20/2019 12:45 PM Medical Record Number:[id number] Patient Account Number: [id number] Date of Birth/Sex: Treating RN: [birth year] (49 y.o. [name], [name] Primary Care Lashunda Greis: [name] Other Clinician: Referring Kamarion Zagami: Treating Yui Mulvaney/Extender:[name], [name], [name] in Treatment: 28 Visit Information History Since Last Visit Added or deleted any medications: No Patient Arrived: Wheel Chair Any new allergies or adverse reactions: No Arrival Time: 13:04 Had a fall or experienced change in No activities of daily living that may affect Accompanied By: self risk of falls: Transfer Assistance: None Signs or symptoms of abuse/neglect since last No Patient Identification Verified: Yes visito Secondary Verification Process Completed: Yes Hospitalized since last visit: No Patient Requires Transmission-Based No Implantable device outside of the clinic excluding No Precautions: cellular tissue based products placed in the center Patient Has Alerts: No since last visit: Has Dressing in Place as Prescribed: Yes Pain Present Now: No Electronic Signature(s) Signed: 04/20/2019 5:35:37 PM By: Baruch Gouty RN, BSN Entered By: Baruch Gouty on 04/20/2019 13:05:09 -------------------------------------------------------------------------------- Clinic Level of Care Assessment Details Patient Name: Date of Service: AMALIE, KORAN 04/20/2019 12:45 PM Medical Record Number:[id number] Patient Account Number: [id number] Date of Birth/Sex: Treating RN: Sep 22, 1970 (49 y.o. Nancy Fetter Primary Care Othello Dickenson: [name] Other Clinician: Referring Danyal Adorno: Treating Murray Durrell/Extender:[name], [name], [name] in Treatment: 32 Clinic Level of Care Assessment Items TOOL 4 Quantity Score X - Use when only  an EandM is performed on FOLLOW-UP visit 1 0 ASSESSMENTS - Nursing Assessment / Reassessment X - Reassessment of Co-morbidities (includes updates in patient status) 1 10 X - Reassessment of Adherence to Treatment Plan 1 5 ASSESSMENTS - Wound and Skin Assessment / Reassessment '[]'$  - Simple Wound Assessment / Reassessment - one wound 0 X - Complex Wound Assessment / Reassessment - multiple wounds 2 5 '[]'$  - Dermatologic / Skin Assessment (not related to wound area) 0 ASSESSMENTS - Focused Assessment '[]'$  - Circumferential Edema Measurements - multi extremities 0 '[]'$  - Nutritional Assessment / Counseling / Intervention 0 X - Lower Extremity Assessment (monofilament, tuning fork, pulses) 1 5 '[]'$  - Peripheral Arterial Disease Assessment (using hand held doppler) 0 ASSESSMENTS - Ostomy and/or Continence Assessment and Care '[]'$  - Incontinence Assessment and Management 0 '[]'$  - Ostomy Care Assessment and Management (repouching, etc.) 0 PROCESS - Coordination of Care X - Simple Patient / Family Education for ongoing care 1 15 '[]'$  - Complex (extensive) Patient / Family Education for ongoing care 0 X - Staff obtains Programmer, systems, Records, Test Results / Process Orders 1 10 X - Staff telephones HHA, Nursing Homes / Clarify orders / etc 1 10 '[]'$  - Routine Transfer to another Facility (non-emergent condition) 0 '[]'$  - Routine Hospital Admission (non-emergent condition) 0 '[]'$  - New Admissions / Biomedical engineer / Ordering NPWT, Apligraf, etc. 0 '[]'$  - Emergency Hospital Admission (emergent condition) 0 X - Simple Discharge Coordination 1 10 '[]'$  - Complex (extensive) Discharge Coordination 0 PROCESS - Special Needs '[]'$  - Pediatric / Minor Patient Management 0 '[]'$  - Isolation Patient Management 0 '[]'$  - Hearing / Language / Visual special needs 0 '[]'$  - Assessment of Community assistance (transportation, D/C planning, etc.) 0 '[]'$  - Additional assistance / Altered mentation 0 '[]'$  - Support Surface(s) Assessment (bed,  cushion, seat, etc.) 0 INTERVENTIONS - Wound Cleansing / Measurement '[]'$  - Simple Wound Cleansing - one wound 0 X - Complex Wound Cleansing - multiple wounds 2 5 X -  Wound Imaging (photographs - any number of wounds) 1 5 '[]'$  - Wound Tracing (instead of photographs) 0 '[]'$  - Simple Wound Measurement - one wound 0 X - Complex Wound Measurement - multiple wounds 2 5 INTERVENTIONS - Wound Dressings '[]'$  - Small Wound Dressing one or multiple wounds 0 X - Medium Wound Dressing one or multiple wounds 1 15 '[]'$  - Large Wound Dressing one or multiple wounds 0 X - Application of Medications - topical 1 5 '[]'$  - Application of Medications - injection 0 INTERVENTIONS - Miscellaneous '[]'$  - External ear exam 0 '[]'$  - Specimen Collection (cultures, biopsies, blood, body fluids, etc.) 0 '[]'$  - Specimen(s) / Culture(s) sent or taken to Lab for analysis 0 '[]'$  - Patient Transfer (multiple staff / Civil Service fast streamer / Similar devices) 0 '[]'$  - Simple Staple / Suture removal (25 or less) 0 '[]'$  - Complex Staple / Suture removal (26 or more) 0 '[]'$  - Hypo / Hyperglycemic Management (close monitor of Blood Glucose) 0 '[]'$  - Ankle / Brachial Index (ABI) - do not check if billed separately 0 X - Vital Signs 1 5 Has the patient been seen at the hospital within the last three years: Yes Total Score: 125 Level Of Care: New/Established - Level 4 Electronic Signature(s) Signed: 04/20/2019 6:02:32 PM By: [name] RN, BSN Entered By: [name] on 04/20/2019 14:14:09 -------------------------------------------------------------------------------- Encounter Discharge Information Details Patient Name: Date of Service: [name] 04/20/2019 12:45 PM Medical Record Number:[id number] Patient Account Number: [id number] Date of Birth/Sex: Treating RN: 18-Apr-1970 (49 y.o. [name] Primary Care Jaela Yepez: [name] Other Clinician: Referring Aneri Slagel: Treating Clelia Trabucco/Extender:[name], [name], [name] in  Treatment: 42 Encounter Discharge Information Items Discharge Condition: Stable Ambulatory Status: Wheelchair Discharge Destination: Home Transportation: Private Auto Accompanied By: self Schedule Follow-up Appointment: Yes Clinical Summary of Care: Electronic Signature(s) Signed: 04/20/2019 5:45:39 PM By: Deon Pilling Entered By: Deon Pilling on 04/20/2019 13:52:36 -------------------------------------------------------------------------------- Lower Extremity Assessment Details Patient Name: Date of Service: GICELA, SCHWARTING 04/20/2019 12:45 PM Medical Record Number:[id number] Patient Account Number: [id number] Date of Birth/Sex: Treating RN: [birth year] (49 y.o. [name] Primary Care Alisse Tuite: [name] Other Clinician: Referring Becky Colan: Treating Anjoli Diemer/Extender:[name], [name], [name] in Treatment: 32 Edema Assessment Assessed: [Left: No] [Right: No] Edema: [Left: No] [Right: No] Calf Left: Right: Point of Measurement: cm From Medial Instep 30.4 cm 26.5 cm Ankle Left: Right: Point of Measurement: cm From Medial Instep 19.1 cm 18 cm Vascular Assessment Pulses: Dorsalis Pedis Palpable: [Left:Yes] [Right:No] Electronic Signature(s) Signed: 04/20/2019 5:35:37 PM By: Baruch Gouty RN, BSN Entered By: Baruch Gouty on 04/20/2019 13:12:31 -------------------------------------------------------------------------------- Multi Wound Chart Details Patient Name: Date of Service: [name] 04/20/2019 12:45 PM Medical Record Number:[id number] Patient Account Number: [id number] Date of Birth/Sex: Treating RN: 04/07/1970 (49 y.o. F) Primary Care Jassen Sarver: [name] Other Clinician: Referring Rechelle Niebla: Treating Renesha Lizama/Extender:[name], [name], [name] in Treatment: 32 Vital Signs Height(in): 67 Capillary Blood 153 Glucose(mg/dl): Weight(lbs): 125 Pulse(bpm): 108 Body Mass Index(BMI): 20 Blood Pressure(mmHg):  160/104 Temperature(F): 98.4 Respiratory 18 Rate(breaths/min): Photos: [43:No Photos] [47:No Photos] [48:No Photos] Wound Location: [43:Right Foot - Medial] [47:Right Lower Leg - Anterior Left Toe Fourth] Wounding Event: [43:Gradually Appeared] [47:Gradually Appeared] [48:Trauma] Primary Etiology: [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] [48:Diabetic Wound/Ulcer of the Lower Extremity] Comorbid History: [43:Cataracts, Chronic sinus Cataracts, Chronic sinus Cataracts, Chronic sinus problems/congestion, Anemia, Sleep Apnea, Deep Anemia, Sleep Apnea, Deep Anemia, Sleep Apnea, Deep Vein Thrombosis, Hypertension, Peripheral Hypertension,  Peripheral Hypertension, Peripheral Arterial Disease, Type  I Diabetes, Osteoarthritis, Osteomyelitis, Neuropathy, Osteomyelitis, Neuropathy, Osteomyelitis, Neuropathy, Seizure Disorder] [47:problems/congestion, Vein Thrombosis, Arterial Disease, Type I  Diabetes, Osteoarthritis, Diabetes, Osteoarthritis, Seizure Disorder] [48:problems/congestion, Vein Thrombosis, Arterial Disease, Type I Seizure Disorder] Date Acquired: [43:08/04/2018] [47:01/31/2019] [48:04/17/2019] Weeks of Treatment: [43:32] [47:11] [48:0] Wound Status: [43:Open] [47:Open] [48:Open] Clustered Wound: [43:Yes] [47:No] [48:No] Clustered Quantity: [43:3] [47:N/A] [48:N/A] Measurements L x W x D 5.8x2.8x0.7 [47:0x0x0] [48:0.3x0.4x0.1] (cm) Area (cm) : [43:12.755] [47:0] [48:0.094] Volume (cm) : [43:8.928] [47:0] [48:0.009] % Reduction in Area: [43:82.40%] [47:100.00%] [48:N/A] % Reduction in Volume: 59.00% [47:100.00%] [48:N/A] Classification: [43:Grade 4] [47:Full Thickness Without Exposed Support Structures] [48:Grade 1] Exudate Amount: [43:Medium] [47:None Present] [48:Small] Exudate Type: [43:Purulent] [47:N/A] [48:Serosanguineous] Exudate Color: [43:yellow, brown, green] [47:N/A] [48:red, brown] Wound Margin: [43:Flat and Intact] [47:Distinct, outline attached] [48:Flat and  Intact] Granulation Amount: [43:Small (1-33%)] [47:None Present (0%)] [48:Large (67-100%)] Granulation Quality: [43:Pink, Friable] [47:N/A] [48:Red] Necrotic Amount: [43:Large (67-100%)] [47:None Present (0%)] [48:None Present (0%)] Exposed Structures: [43:Fat Layer (Subcutaneous Tissue) Exposed: Yes Tendon: Yes Muscle: Yes Fascia: No Joint: No Bone: No Small (1-33%)] [47:Fascia: No Fat Layer (Subcutaneous Tissue) Exposed: No Tendon: No Muscle: No Joint: No Bone: No Large (67-100%)] [48:Fat Layer  (Subcutaneous Tissue) Exposed: Yes Fascia: No Tendon: No Muscle: No Joint: No Bone: No Small (1-33%)] Treatment Notes Wound #43 (Right, Medial Foot) 1. Cleanse With Wound Cleanser 3. Primary Dressing Applied Calcium Alginate Ag 4. Secondary Dressing Dry Gauze Other secondary dressing (specify in notes) Notes ace wrap as secondary. patient declined kerlix. Wound #48 (Left Toe Fourth) 1. Cleanse With Wound Cleanser 3. Primary Dressing Applied Calcium Alginate Ag 4. Secondary Dressing Dry Gauze 5. Secured With [facility]) Signed: 04/21/2019 4:08:35 PM By: Linton Ham MD Entered By: Linton Ham on 04/20/2019 14:24:46 -------------------------------------------------------------------------------- Multi-Disciplinary Care Plan Details Patient Name: Date of Service: [name] 04/20/2019 12:45 PM Medical Record Number:[id number] Patient Account Number: [id number] Date of Birth/Sex: Treating RN: 07/19/1970 (49 y.o. Nancy Fetter Primary Care Bettylou Frew: [name] Other Clinician: Referring Cherree Conerly: Treating Hyland Mollenkopf/Extender:[name], [name], [name] in Treatment: 73 Active Inactive Nutrition Nursing Diagnoses: Imbalanced nutrition Impaired glucose control: actual or potential Potential for alteratiion in Nutrition/Potential for imbalanced nutrition Goals: Patient/caregiver agrees to and verbalizes understanding of need to  use nutritional supplements and/or vitamins as prescribed Date Initiated: 09/03/2018 Date Inactivated: 10/07/2018 Target Resolution Date: 10/01/2018 Goal Status: Met Patient/caregiver will maintain therapeutic glucose control Date Initiated: 09/03/2018 Target Resolution Date: 05/08/2019 Goal Status: Active Interventions: Provide education on elevated blood sugars and impact on wound healing Treatment Activities: Patient referred to Primary Care Physician for further nutritional evaluation : 09/03/2018 Notes: Wound/Skin Impairment Nursing Diagnoses: Impaired tissue integrity Knowledge deficit related to ulceration/compromised skin integrity Goals: Patient/caregiver will verbalize understanding of skin care regimen Date Initiated: 09/03/2018 Target Resolution Date: 05/08/2019 Goal Status: Active Ulcer/skin breakdown will have a volume reduction of 30% by week 4 Date Initiated: 09/03/2018 Date Inactivated: 10/07/2018 Target Resolution Date: 10/01/2018 Unmet Goal Status: Unmet Reason: COMORBITIES Ulcer/skin breakdown will have a volume reduction of 50% by week 8 Date Initiated: 10/07/2018 Date Inactivated: 11/03/2018 Target Resolution Date: 10/31/2018 Unmet Goal Status: Unmet Reason: Osteomyelitis Interventions: Assess patient/caregiver ability to obtain necessary supplies Assess patient/caregiver ability to perform ulcer/skin care regimen upon admission and as needed Assess ulceration(s) every visit Provide education on ulcer and skin care Treatment Activities: Skin care regimen initiated : 09/03/2018 Topical wound management initiated : 09/03/2018 Notes: Electronic Signature(s) Signed: 04/20/2019 6:02:32 PM By: [name] RN, BSN Entered By: [name]  on 04/20/2019 13:08:23 -------------------------------------------------------------------------------- Pain Assessment Details Patient Name: Date of Service: CRISELDA, STARKE 04/20/2019 12:45 PM Medical Record Number:[id number] Patient  Account Number: [id number] Date of Birth/Sex: Treating RN: [birth year] (49 y.o. [name] Primary Care Mancel Lardizabal: [name] Other Clinician: Referring Shakir Petrosino: Treating Arhaan Chesnut/Extender:[name], [name], [name] in Treatment: 32 Active Problems Location of Pain Severity and Description of Pain Patient Has Paino No Site Locations Rate the pain. Current Pain Level: 0 Pain Management and Medication Current Pain Management: Electronic Signature(s) Signed: 04/20/2019 5:35:37 PM By: Baruch Gouty RN, BSN Entered By: Baruch Gouty on 04/20/2019 13:06:51 -------------------------------------------------------------------------------- Patient/Caregiver Education Details Patient Name: Date of Service: [name] 1/18/2021andnbsp12:45 PM Medical Record Patient Account Number: [id number] Number: Treating RN: [name] Date of Birth/Gender: [birth year] (49 y.o. F) Other Clinician: Primary Care Physician: [name] Treating Royal Hawthorn Referring Physician: Physician/Extender: Juanito Doom in Treatment: 55 Education Assessment Education Provided To: Patient Education Topics Provided Wound/Skin Impairment: Methods: Explain/Verbal Responses: State content correctly Electronic Signature(s) Signed: 04/20/2019 6:02:32 PM By: [name] RN, BSN Entered By: [name] on 04/20/2019 13:08:39 -------------------------------------------------------------------------------- Wound Assessment Details Patient Name: Date of Service: [name] 04/20/2019 12:45 PM Medical Record Number:[id number] Patient Account Number: [id number] Date of Birth/Sex: Treating RN: 1971/01/05 (49 y.o. [name], [name] Primary Care Alexande Sheerin: [name] Other Clinician: Referring Hortence Charter: Treating Karesa Maultsby/Extender:[name], [name], [name] in Treatment: 32 Wound Status Wound Number: 43 Primary  Diabetic Wound/Ulcer of the Lower Extremity Etiology: Wound Location: Right Foot - Medial Wound Open Wounding Event: Gradually Appeared Status: Date Acquired: 08/04/2018 Comorbid Cataracts, Chronic sinus problems/congestion, Weeks Of Treatment: 32 History: Anemia, Sleep Apnea, Deep Vein Thrombosis, Clustered Wound: Yes Hypertension, Peripheral Arterial Disease, Type I Diabetes, Osteoarthritis, Osteomyelitis, Neuropathy, Seizure Disorder Photos Wound Measurements Length: (cm) 5.8 % Reduc Width: (cm) 2.8 % Reduc Depth: (cm) 0.7 Epitheli Clustered Quantity: 3 Tunnelin Area: (cm) 12.755 Undermi Volume: (cm) 8.928 Wound Description Classification: Grade 4 Wound Margin: Flat and Intact Exudate Amount: Medium Exudate Type: Purulent Exudate Color: yellow, brown, green Wound Bed Granulation Amount: Small (1-33%) Granulation Quality: Pink, Friable Necrotic Amount: Large (67-100%) Necrotic Quality: Adherent Slough Foul Odor After Cleansing: No Slough/Fibrino Yes Exposed Structure Fascia Exposed: No Fat Layer (Subcutaneous Tissue) Exposed: Yes Tendon Exposed: Yes Muscle Exposed: Yes Necrosis of Muscle: Yes Joint Exposed: No Bone Exposed: No tion in Area: 82.4% tion in Volume: 59% alization: Small (1-33%) g: No ning: No Treatment Notes Wound #43 (Right, Medial Foot) 1. Cleanse With Wound Cleanser 3. Primary Dressing Applied Calcium Alginate Ag 4. Secondary Dressing Dry Gauze Other secondary dressing (specify in notes) Notes ace wrap as secondary. patient declined kerlix. Electronic Signature(s) Signed: 04/20/2019 5:35:37 PM By: Baruch Gouty RN, BSN Signed: 04/21/2019 4:37:08 PM By: [name] EMT/HBOT Entered By: [name] on 04/20/2019 15:08:57 -------------------------------------------------------------------------------- Wound Assessment Details Patient Name: Date of Service: [name] 04/20/2019 12:45 PM Medical Record Number:[id number]  Patient Account Number: [id number] Date of Birth/Sex: Treating RN: Nov 12, 1970 (49 y.o. [name], [name] Primary Care Teddi Badalamenti: [name] Other Clinician: Referring Meiko Ives: Treating Isley Weisheit/Extender:[name], [name], [name] in Treatment: 32 Wound Status Wound Number: 47 Primary Abscess Etiology: Wound Location: Right Lower Leg - Anterior Wound Healed - Epithelialized Wounding Event: Gradually Appeared Status: Date Acquired: 01/31/2019 Comorbid Cataracts, Chronic sinus problems/congestion, Weeks Of Treatment: 11 History: Anemia, Sleep Apnea, Deep Vein Thrombosis, Clustered Wound: No Hypertension, Peripheral Arterial Disease, Type I Diabetes, Osteoarthritis, Osteomyelitis, Neuropathy, Seizure Disorder Photos Wound Measurements Length: (cm) 0 %  Reduct Width: (cm) 0 % Reduct Depth: (cm) 0 Epitheli Area: (cm) 0 Tunneli Volume: (cm) 0 Undermi Wound Description Classification: Full Thickness Without Exposed Support Foul Odo Structures Slough/F Wound Distinct, outline attached Margin: Exudate None Present Amount: Wound Bed Granulation Amount: None Present (0%) Necrotic Amount: None Present (0%) Fascia E Fat Laye Tendon E Muscle E Joint Ex Bone Exp r After Cleansing: No ibrino No Exposed Structure xposed: No r (Subcutaneous Tissue) Exposed: No xposed: No xposed: No posed: No osed: No ion in Area: 100% ion in Volume: 100% alization: Large (67-100%) ng: No ning: No Electronic Signature(s) Signed: 04/20/2019 5:35:37 PM By: Baruch Gouty RN, BSN Signed: 04/21/2019 4:37:08 PM By: [name] EMT/HBOT Entered By: [name] on 04/20/2019 15:08:32 -------------------------------------------------------------------------------- Wound Assessment Details Patient Name: Date of Service: [name] 04/20/2019 12:45 PM Medical Record Number:[id number] Patient Account Number: [id number] Date of Birth/Sex: Treating RN: December 05, 1970 (49  y.o. [name] Primary Care Charlene Detter: Other Clinician: [name] Referring Keigo Whalley: Treating Royann Wildasin/Extender:[name], [name], [name] in Treatment: 32 Wound Status Wound Number: 60 Primary Diabetic Wound/Ulcer of the Lower Extremity Etiology: Wound Location: Left Toe Fourth Wound Open Wounding Event: Trauma Status: Date Acquired: 04/17/2019 Comorbid Cataracts, Chronic sinus problems/congestion, Weeks Of Treatment: 0 History: Anemia, Sleep Apnea, Deep Vein Thrombosis, Clustered Wound: No Hypertension, Peripheral Arterial Disease, Type I Diabetes, Osteoarthritis, Osteomyelitis, Neuropathy, Seizure Disorder Photos Wound Measurements Length: (cm) 0.3 % Reduction in Width: (cm) 0.4 % Reduction in Depth: (cm) 0.1 Epithelializati Area: (cm) 0.094 Tunneling: Volume: (cm) 0.009 Undermining: Wound Description Classification: Grade 1 Foul Odor Afte Wound Margin: Flat and Intact Slough/Fibrino Exudate Amount: Small Exudate Type: Serosanguineous Exudate Color: red, brown Wound Bed Granulation Amount: Large (67-100%) Granulation Quality: Red Fascia Exposed: Necrotic Amount: None Present (0%) Fat Layer (Subc Tendon Exposed: Muscle Exposed: Joint Exposed: Bone Exposed: r Cleansing: No No Exposed Structure No utaneous Tissue) Exposed: Yes No No No No Area: 0% Volume: 0% on: Small (1-33%) No No Treatment Notes Wound #48 (Left Toe Fourth) 1. Cleanse With Wound Cleanser 3. Primary Dressing Applied Calcium Alginate Ag 4. Secondary Dressing Dry Gauze 5. Secured With [employer] [facility] [employer]) Signed: 04/20/2019 5:35:37 PM By: Baruch Gouty RN, BSN Signed: 04/21/2019 4:37:08 PM By: [name] EMT/HBOT Entered By: [name] on 04/20/2019 15:08:00 -------------------------------------------------------------------------------- Vitals Details Patient Name: Date of Service: [name] 04/20/2019 12:45  PM Medical Record Number:[id number] Patient Account Number: [id number] Date of Birth/Sex: Treating RN: 28-Apr-1970 (49 y.o. [name] Primary Care Maximus Hoffert: [name] Other Clinician: Referring Echo Propp: Treating [place] Pelto/Extender:[name], [name], [name] in Treatment: 32 Vital Signs Time Taken: 13:05 Temperature (F): 98.4 Height (in): 67 Pulse (bpm): 93 Source: Stated Respiratory Rate (breaths/min): 18 Weight (lbs): 125 Blood Pressure (mmHg): 160/104 Source: Stated Capillary Blood Glucose (mg/dl): 153 Body Mass Index (BMI): 19.6 Reference Range: 80 - 120 mg / dl Notes glucose per pt report last night Electronic Signature(s) Signed: 04/20/2019 5:35:37 PM By: Baruch Gouty RN, BSN Entered By: Baruch Gouty on 04/20/2019 13:06:19

## 2019-04-21 NOTE — H&P (View-Only) (Signed)
[facility]ist of [place]  Patient name: [name] MRN: [id number] DOB: Apr 02, 1971 Sex: female  REASON FOR VISIT: Continued follow-up of hemodialysis access  HPI: [name] is a 49 y.o. female known to me from prior right IJ catheter placement and left for stage basilic vein transposition in May 2020.  She has had multiple medical issues with lower extremity wounds and has not been able to coordinate second stage transposition.  She reports no difficulty with her catheter.  Past Medical History:  Diagnosis Date  . Anemia   . Arthritis   . Bronchitis   . C. difficile diarrhea 09/26/2014  . Cataracts, both eyes   . Cellulitis of right foot 06/02/2014  . CKD (chronic kidney disease) stage 3, GFR 30-59 ml/min    Dialysis T, TH, Sat  . Diabetic ulcer of right foot ([place]) 06/02/2014  . DVT (deep venous thrombosis) ([place]) 10/2014   one in each one in leg- PICC line , one in right and left arm  . H/O seasonal allergies   . Heart murmur    told once when she was pregnant- no furter mention- was in notes 11/2015- had Echo 8 /4/17  . History of blood transfusion   . Hypercholesteremia   . Hypertension   . Left foot infection   . Neuropathy    feet  . Neuropathy in diabetes ([place])   . Peripheral vascular disease ([place])   . Sleep apnea    not able to use CPAP  . Staphylococcus aureus bacteremia 10/2014  . Type 1 diabetes ([place])    onset age 40    Family History  Problem Relation Age of Onset  . Hyperlipidemia Mother   . Dementia Mother     SOCIAL HISTORY: Social History   Tobacco Use  . Smoking status: Former Smoker    Years: 15.00    Quit date: 05/16/2008    Years since quitting: 10.9  . Smokeless tobacco: Never Used  Substance Use Topics  . Alcohol use: Not Currently    Comment: Occasional    Allergies  Allergen Reactions  . Cleocin [Clindamycin Hcl] Diarrhea  . Lisinopril Other (See Comments)    Elevated  potassium per pt report  . Amoxicillin Diarrhea, Nausea Only and Other (See Comments)    Has patient had a PCN reaction causing immediate rash, facial/tongue/throat swelling, SOB or lightheadedness with hypotension: No Has patient had a PCN reaction causing severe rash involving mucus membranes or skin necrosis: No Has patient had a PCN reaction that required hospitalization: No Has patient had a PCN reaction occurring within the last 10 years: No If all of the above answers are "NO", then may proceed with Cephalosporin use.   . Bactrim [Sulfamethoxazole-Trimethoprim] Diarrhea and Nausea Only    Current Outpatient Medications  Medication Sig Dispense Refill  . albuterol (PROVENTIL HFA;VENTOLIN HFA) 108 (90 BASE) MCG/ACT inhaler Inhale 2 puffs into the lungs every 6 (six) hours as needed for wheezing or shortness of breath. Reported on 04/15/2015    . amLODipine (NORVASC) 5 MG tablet Take 1 tablet (5 mg total) by mouth daily. 30 tablet 0  . aspirin 81 MG tablet Take 81 mg by mouth every evening.     . B Complex-C-Zn-Folic Acid (DIALYVITE 262 WITH ZINC) 0.8 MG TABS Take 1 tablet by mouth daily.    . calcitRIOL (ROCALTROL) 0.5 MCG capsule Take 1 capsule daily 90 capsule 2  . calcium acetate (PHOSLO) 667 MG capsule Take 2  capsules (1,334 mg total) by mouth 3 (three) times daily with meals. 180 capsule 0  . CINNAMON PO Take 1 tablet by mouth every evening.     . Continuous Blood Gluc Receiver ([place]) [place] 1 each by Does not apply route See admin instructions. Use Dexcom Receiver to monitor blood sugar continuously. 1 each 0  . Continuous Blood Gluc Sensor (DEXCOM G6 SENSOR) MISC 1 each by Does not apply route See admin instructions. Use one sensors once every 10 days to monitor blood sugars. 9 each 3  . Continuous Blood Gluc Transmit (DEXCOM G6 TRANSMITTER) MISC 1 each by Does not apply route See admin instructions. Use one transmitter once every 90 days. 1 each 3  . CONTOUR NEXT TEST  test strip USE AS INSTRUCTED TO CHECK BLOOD SUGAR 4 TIMES A DAY. DX E10.65 478 strip 4  . folic acid (FOLVITE) 1 MG tablet Take 1 mg by mouth every evening.     . insulin aspart (NOVOLOG) 100 UNIT/ML injection USE A MAX OF 65 UNITS DAILY VIA INSULIN PUMP. 20 mL 2  . linezolid (ZYVOX) 600 MG tablet Take 600 mg by mouth 2 (two) times daily.    . norethindrone (AYGESTIN) 5 MG tablet TAKE 1 TABLET BY MOUTH DAILY FOR 14 DAYS, THEN TAKE 14 DAYS OFF    . pravastatin (PRAVACHOL) 40 MG tablet TAKE 1 TABLET BY MOUTH EVERY DAY 90 tablet 1  . pyridoxine (B-6) 100 MG tablet Take 100 mg by mouth daily. Reported on 04/15/2015    . SANTYL ointment APPLY TOPICALLY TO WOUND WITH DRESSING CHANGES    . sulfamethoxazole-trimethoprim (BACTRIM DS) 800-160 MG tablet Take 1 tablet by mouth daily.    . TURMERIC PO Take 1 capsule by mouth daily.     No current facility-administered medications for this visit.    REVIEW OF SYSTEMS:  [X]  denotes positive finding, [ ]  denotes negative finding Cardiac  Comments:  Chest pain or chest pressure:    Shortness of breath upon exertion:    Short of breath when lying flat:    Irregular heart rhythm:        Vascular    Pain in calf, thigh, or hip brought on by ambulation:    Pain in feet at night that wakes you up from your sleep:     Blood clot in your veins:    Leg swelling:           PHYSICAL EXAM: Vitals:   04/21/19 1331  BP: 116/62  Pulse: 99  Resp: 20  Temp: (!) 97.3 F (36.3 C)  SpO2: 100%  Weight: 126 lb 12.2 oz (57.5 kg)  Height: 5\' 7"  (1.702 m)    GENERAL: The patient is a well-nourished female, in no acute distress. The vital signs are documented above. CARDIOVASCULAR: Her left antecubital incision is quite well-healed.  She does have an excellent thrill present.  I do not palpate radial or ulnar pulses but she does not have any steal symptoms PULMONARY: There is good air exchange  MUSCULOSKELETAL: There are no major deformities or  cyanosis. NEUROLOGIC: No focal weakness or paresthesias are detected. SKIN: There are no ulcers or rashes noted. PSYCHIATRIC: The patient has a normal affect.  DATA:  Duplex today shows some narrowing at the AV anastomosis.  Bifurcated basilic vein but good caliber.  MEDICAL ISSUES: I recommended second stage basilic vein transposition.  Would recommend that we do this is soon as possible to hopefully expedite getting her catheter out  since it has been present since May.  She understands the procedure and wishes to proceed on a nondialysis day    Rosetta Posner, MD [facility] Vascular and Vein Specialists of [facility] Tel [phone] Pager [phone]

## 2019-04-21 NOTE — Progress Notes (Signed)
Call to patient and instructed to be at [facility] admitting at 5:30 am on 04/29/2019 for surgery. NPO past MN night prior and must have driver and caregiver for discharge. Expect a call from hospital pre-admission department for detailed instruction for insulin adjustment, medications and scheduled time for pre-op nasal swab for Covid-19. Letter faxed to [facility] with instructions.

## 2019-04-21 NOTE — Progress Notes (Signed)
Vascular and Vein Specialist of [place]  Patient name: [name] MRN: [id number] DOB: 04-29-1970 Sex: female  REASON FOR VISIT: Continued follow-up of hemodialysis access  HPI: [name] is a 49 y.o. female known to me from prior right IJ catheter placement and left for stage basilic vein transposition in May 2020.  She has had multiple medical issues with lower extremity wounds and has not been able to coordinate second stage transposition.  She reports no difficulty with her catheter.  Past Medical History:  Diagnosis Date  . Anemia   . Arthritis   . Bronchitis   . C. difficile diarrhea 09/26/2014  . Cataracts, both eyes   . Cellulitis of right foot 06/02/2014  . CKD (chronic kidney disease) stage 3, GFR 30-59 ml/min    Dialysis T, TH, Sat  . Diabetic ulcer of right foot ([place]) 06/02/2014  . DVT (deep venous thrombosis) ([place]) 10/2014   one in each one in leg- PICC line , one in right and left arm  . H/O seasonal allergies   . Heart murmur    told once when she was pregnant- no furter mention- was in notes 11/2015- had Echo 8 /4/17  . History of blood transfusion   . Hypercholesteremia   . Hypertension   . Left foot infection   . Neuropathy    feet  . Neuropathy in diabetes ([place])   . Peripheral vascular disease ([place])   . Sleep apnea    not able to use CPAP  . Staphylococcus aureus bacteremia 10/2014  . Type 1 diabetes ([place])    onset age 67    Family History  Problem Relation Age of Onset  . Hyperlipidemia Mother   . Dementia Mother     SOCIAL HISTORY: Social History   Tobacco Use  . Smoking status: Former Smoker    Years: 15.00    Quit date: 05/16/2008    Years since quitting: 10.9  . Smokeless tobacco: Never Used  Substance Use Topics  . Alcohol use: Not Currently    Comment: Occasional    Allergies  Allergen Reactions  . Cleocin [Clindamycin Hcl] Diarrhea  . Lisinopril Other (See Comments)    Elevated  potassium per pt report  . Amoxicillin Diarrhea, Nausea Only and Other (See Comments)    Has patient had a PCN reaction causing immediate rash, facial/tongue/throat swelling, SOB or lightheadedness with hypotension: No Has patient had a PCN reaction causing severe rash involving mucus membranes or skin necrosis: No Has patient had a PCN reaction that required hospitalization: No Has patient had a PCN reaction occurring within the last 10 years: No If all of the above answers are "NO", then may proceed with Cephalosporin use.   . Bactrim [Sulfamethoxazole-Trimethoprim] Diarrhea and Nausea Only    Current Outpatient Medications  Medication Sig Dispense Refill  . albuterol (PROVENTIL HFA;VENTOLIN HFA) 108 (90 BASE) MCG/ACT inhaler Inhale 2 puffs into the lungs every 6 (six) hours as needed for wheezing or shortness of breath. Reported on 04/15/2015    . amLODipine (NORVASC) 5 MG tablet Take 1 tablet (5 mg total) by mouth daily. 30 tablet 0  . aspirin 81 MG tablet Take 81 mg by mouth every evening.     . B Complex-C-Zn-Folic Acid (DIALYVITE 419 WITH ZINC) 0.8 MG TABS Take 1 tablet by mouth daily.    . calcitRIOL (ROCALTROL) 0.5 MCG capsule Take 1 capsule daily 90 capsule 2  . calcium acetate (PHOSLO) 667 MG capsule Take 2  capsules (1,334 mg total) by mouth 3 (three) times daily with meals. 180 capsule 0  . CINNAMON PO Take 1 tablet by mouth every evening.     . Continuous Blood Gluc Receiver ([place]) [place] 1 each by Does not apply route See admin instructions. Use Dexcom Receiver to monitor blood sugar continuously. 1 each 0  . Continuous Blood Gluc Sensor (DEXCOM G6 SENSOR) MISC 1 each by Does not apply route See admin instructions. Use one sensors once every 10 days to monitor blood sugars. 9 each 3  . Continuous Blood Gluc Transmit (DEXCOM G6 TRANSMITTER) MISC 1 each by Does not apply route See admin instructions. Use one transmitter once every 90 days. 1 each 3  . CONTOUR NEXT TEST  test strip USE AS INSTRUCTED TO CHECK BLOOD SUGAR 4 TIMES A DAY. DX E10.65 678 strip 4  . folic acid (FOLVITE) 1 MG tablet Take 1 mg by mouth every evening.     . insulin aspart (NOVOLOG) 100 UNIT/ML injection USE A MAX OF 65 UNITS DAILY VIA INSULIN PUMP. 20 mL 2  . linezolid (ZYVOX) 600 MG tablet Take 600 mg by mouth 2 (two) times daily.    . norethindrone (AYGESTIN) 5 MG tablet TAKE 1 TABLET BY MOUTH DAILY FOR 14 DAYS, THEN TAKE 14 DAYS OFF    . pravastatin (PRAVACHOL) 40 MG tablet TAKE 1 TABLET BY MOUTH EVERY DAY 90 tablet 1  . pyridoxine (B-6) 100 MG tablet Take 100 mg by mouth daily. Reported on 04/15/2015    . SANTYL ointment APPLY TOPICALLY TO WOUND WITH DRESSING CHANGES    . sulfamethoxazole-trimethoprim (BACTRIM DS) 800-160 MG tablet Take 1 tablet by mouth daily.    . TURMERIC PO Take 1 capsule by mouth daily.     No current facility-administered medications for this visit.    REVIEW OF SYSTEMS:  [X]  denotes positive finding, [ ]  denotes negative finding Cardiac  Comments:  Chest pain or chest pressure:    Shortness of breath upon exertion:    Short of breath when lying flat:    Irregular heart rhythm:        Vascular    Pain in calf, thigh, or hip brought on by ambulation:    Pain in feet at night that wakes you up from your sleep:     Blood clot in your veins:    Leg swelling:           PHYSICAL EXAM: Vitals:   04/21/19 1331  BP: 116/62  Pulse: 99  Resp: 20  Temp: (!) 97.3 F (36.3 C)  SpO2: 100%  Weight: 126 lb 12.2 oz (57.5 kg)  Height: 5\' 7"  (1.702 m)    GENERAL: The patient is a well-nourished female, in no acute distress. The vital signs are documented above. CARDIOVASCULAR: Her left antecubital incision is quite well-healed.  She does have an excellent thrill present.  I do not palpate radial or ulnar pulses but she does not have any steal symptoms PULMONARY: There is good air exchange  MUSCULOSKELETAL: There are no major deformities or  cyanosis. NEUROLOGIC: No focal weakness or paresthesias are detected. SKIN: There are no ulcers or rashes noted. PSYCHIATRIC: The patient has a normal affect.  DATA:  Duplex today shows some narrowing at the AV anastomosis.  Bifurcated basilic vein but good caliber.  MEDICAL ISSUES: I recommended second stage basilic vein transposition.  Would recommend that we do this is soon as possible to hopefully expedite getting her catheter out  since it has been present since May.  She understands the procedure and wishes to proceed on a nondialysis day    Rosetta Posner, MD [facility] Vascular and Vein Specialists of [facility] Tel [phone] Pager [phone]

## 2019-04-21 NOTE — Telephone Encounter (Signed)
Fax received at [employer].

## 2019-04-22 ENCOUNTER — Ambulatory Visit: Payer: Medicare HMO

## 2019-04-22 ENCOUNTER — Ambulatory Visit: Payer: Medicare HMO | Admitting: Endocrinology

## 2019-04-23 ENCOUNTER — Ambulatory Visit: Payer: Medicare HMO | Admitting: Endocrinology

## 2019-04-24 ENCOUNTER — Telehealth: Payer: Self-pay | Admitting: Endocrinology

## 2019-04-24 NOTE — Telephone Encounter (Signed)
[name] with [facility] ph# [phone] called to request that Dr. Ronnie Derby Nurse call [employer] directly re: Referral that was sent to [place] for Dexcom Meter. The Patient's health insurance has a direct contract with [employer] to service for CGM.

## 2019-04-27 ENCOUNTER — Other Ambulatory Visit: Payer: Self-pay

## 2019-04-27 ENCOUNTER — Other Ambulatory Visit (HOSPITAL_COMMUNITY)
Admission: RE | Admit: 2019-04-27 | Discharge: 2019-04-27 | Disposition: A | Discharge status: Home / Self Care | Payer: Medicare HMO | Source: Ambulatory Visit | Attending: Vascular Surgery | Admitting: Vascular Surgery

## 2019-04-27 ENCOUNTER — Other Ambulatory Visit (HOSPITAL_COMMUNITY): Payer: Medicare HMO

## 2019-04-27 ENCOUNTER — Encounter (HOSPITAL_COMMUNITY): Payer: Self-pay | Admitting: Vascular Surgery

## 2019-04-27 DIAGNOSIS — Z01812 Encounter for preprocedural laboratory examination: Secondary | ICD-10-CM | POA: Insufficient documentation

## 2019-04-27 DIAGNOSIS — Z20822 Contact with and (suspected) exposure to covid-19: Secondary | ICD-10-CM | POA: Diagnosis not present

## 2019-04-27 LAB — SARS CORONAVIRUS 2 (TAT 6-24 HRS): SARS Coronavirus 2: NEGATIVE

## 2019-04-27 NOTE — Anesthesia Preprocedure Evaluation (Addendum)
Anesthesia Evaluation  Patient identified by MRN, date of birth, ID band Patient awake    Reviewed: Allergy & Precautions, NPO status , Patient's Chart, lab work & pertinent test results  History of Anesthesia Complications Negative for: history of anesthetic complications  Airway Mallampati: II  TM Distance: >3 FB Neck ROM: Full    Dental  (+) Dental Advisory Given   Pulmonary sleep apnea , neg recent URI, former smoker,    breath sounds clear to auscultation       Cardiovascular hypertension, Pt. on medications (-) angina+ Peripheral Vascular Disease  (-) Past MI  Rhythm:Regular     Neuro/Psych PSYCHIATRIC DISORDERS Anxiety  Neuromuscular disease    GI/Hepatic negative GI ROS, Neg liver ROS,   Endo/Other  diabetes, Poorly Controlled, Insulin Dependent  Renal/GU ESRF and DialysisRenal diseaseHD 1/26     Musculoskeletal  (+) Arthritis ,   Abdominal   Peds  Hematology  (+) anemia ,   Anesthesia Other Findings   Reproductive/Obstetrics                            Anesthesia Physical Anesthesia Plan  ASA: IV  Anesthesia Plan: General   Post-op Pain Management:    Induction: Intravenous  PONV Risk Score and Plan: 3 and Ondansetron and Dexamethasone  Airway Management Planned: LMA  Additional Equipment: None  Intra-op Plan:   Post-operative Plan: Extubation in OR  Informed Consent: I have reviewed the patients History and Physical, chart, labs and discussed the procedure including the risks, benefits and alternatives for the proposed anesthesia with the patient or authorized representative who has indicated his/her understanding and acceptance.     Dental advisory given  Plan Discussed with: CRNA and Surgeon  Anesthesia Plan Comments: (PAT note written 04/27/2019 by Myra Gianotti, PA-C. )       Anesthesia Quick Evaluation

## 2019-04-27 NOTE — Progress Notes (Addendum)
Patient denies shortness of breath, fever, cough and chest pain.  PCP - [name], PA-C Internal Med - Dr Elayne Snare Cardiologist - denies  Chest x-ray - 08/06/18, 1 view EKG - 10/20/18 Stress Test -  ECHO - 08/03/18 Cardiac Cath - denies  Sleep Study - Yes CPAP - Does not use CPAP  Fasting Blood Sugar - 65s-100s Checks Blood Sugar _4_ times a day  Patient informed to contact Dr Ronnie Derby office to get instructions on how to use insulin pump prior to surgery and DOS. Patient verbalized understanding and agreed to call Dr Dwyane Dee for instructions.  Called Diabetes Coordinator Larene Beach (pager [phone]) to advise her of patient's upcoming 04/29/19 surgery and presence of Humalog insulin pump.  . If your blood sugar is less than 70 mg/dL, you will need to treat for low blood sugar: o Do not take insulin. o Treat a low blood sugar (less than 70 mg/dL) with  cup of clear juice (cranberry or apple), 4 glucose tablets. o Recheck blood sugar in 15 minutes after treatment (to make sure it is greater than 70 mg/dL). If your blood sugar is not greater than 70 mg/dL on recheck, call [phone] for further instructions.  Anesthesia review: Yes  STOP now taking any Aspirin (unless otherwise instructed by your surgeon), Aleve, Naproxen, Ibuprofen, Motrin, Advil, Goody's, BC's, all herbal medications, fish oil, and all vitamins.   Coronavirus Screening Have you experienced the following symptoms:  Cough yes/no: No Fever (>100.42F)  yes/no: No Runny nose yes/no: No Sore throat yes/no: No Difficulty breathing/shortness of breath  yes/no: No  Have you traveled in the last 14 days and where? yes/no: No  Patient verbalized understanding of instructions that were given via phone.

## 2019-04-27 NOTE — Progress Notes (Signed)
Anesthesia Chart Review: [name]   Case: [id number] Date/Time: 04/29/19 0715   Procedure: SECOND STAGE BASCILIC VEIN TRANSPOSITION LEFT ARM (Left )   Anesthesia type: Choice   Pre-op diagnosis: END STAGE RENAL DISEASE FOR HEMODIALYSIS ACCESS   Location: [facility] OR ROOM 11 / [place] [facility]   Surgeons: Rosetta Posner, MD      DISCUSSION: Patient is a 49 year old female scheduled for the above procedure. She underwent placement of right IJ [facility] and first stage basilic transposition in May 2020, but due to ongoing LE wound issues is now just getting around to stage 2.   History includes former smoker (quit 2010), DM1 (on insulin pump), ESRD (HD TTS, [facility]), HTN, hypercholesterolemia, DVT (superficial thrombosis left lesser saphenous vein 10/16/14; non-occlusive thrombus left [place] vein in setting of PICC 05/30/15; chronic thrombus left cephalic vein; segment of right forearm cephalic vein also occluded 11/18/17), OSA (does not use CPAP), peripheral neuropathy, PVD/diabetic foot infections (s/p right foot TMA ~ 2016; s/p left FPBG 05/03/15; s/p left 5th toe amputation 05/05/15; s/p left foot STSG 06/01/15; s/p left foot 5th ray resection and hallux amputation 07/24/15), murmur (mild TR, trivial MR 08/03/18), C. difficile (2016).  A1c 9.3 on 02/04/19, but down from 11.4 on 10/15/18. She is on an insulin pump. Based on the PAT RN phone interview, she doesn't know how to change the basal rate, but is able to given bolus dosing (which she does based on ~ 4 hour post-prandial CBGs, although [name] keeps recommending boluses before meals). Reports home fasting CBGs ~ 65-100's. She was instructed to contact Dr. Dwyane Dee for pre-operative insulin pump instructions--particularly since she may need further instructions on how to adjust basal rate. Surgery is posted for ~ 2 hours, so defer to anesthesiologist whether she will continue insulin pump intraoperatively versus transition to insulin pump. RN did notified DM  Coordinator.  02/25/20 presurgical COVID-19 test is in process. Anesthesia team to evaluate on the day of surgery.   VS:  BP Readings from Last 3 Encounters:  04/21/19 116/62  02/24/19 (!) 148/82  02/04/19 100/60   Pulse Readings from Last 3 Encounters:  04/21/19 99  02/24/19 90  02/04/19 (!) 101    PROVIDERS: Loyola Mast, PA-C his PCP ([place], see Care Everywhere) Elayne Snare, MD is endocrinologist Linton Ham, MD is Wound Care provider [name], DPM is podiatrist Janet Berlin, MD is vascular surgeon (see [place]). She also saw Deitra Mayo on 09/10/18 for extensive right foot wound with osteomyelitis and recommended right BKA, but patient declined and has been continuing with wound care.    LABS: She is for labs on arrival. Currently latest results include: Lab Results  Component Value Date   WBC 6.1 11/10/2018   HGB 9.3 (L) 11/10/2018   HCT 31.2 (L) 11/10/2018   PLT 370 11/10/2018   GLUCOSE 488 (H) 10/20/2018   ALT 19 07/31/2018   AST 27 07/31/2018   NA 131 (L) 10/20/2018   K 5.0 10/20/2018   CL 102 08/08/2018   CREATININE 3.81 (H) 08/08/2018   BUN 17 08/08/2018   CO2 25 08/08/2018   TSH 2.021 07/30/2018   INR 1.4 (H) 07/30/2018   HGBA1C 9.3 (A) 02/04/2019     IMAGES: 1V PCXR 08/06/18: FINDINGS: Right IJ approach double lumen central venous catheter tip projects in the right atrium. Lungs are clear. No pneumothorax. Heart size is normal. No pleural effusion. No focal bony abnormality. IMPRESSION: Dialysis catheter tip projects  in the right atrium. Negative for pneumothorax or acute disease.   EKG: 10/20/18: Normal sinus rhythm Possible Left atrial enlargement Borderline ECG No significant change since last tracing Confirmed by Kirk Ruths [phone]) on 10/20/2018 1:10:00 PM   CV: Echo (limited) 5/30/2): IMPRESSIONS  1. The left ventricle has normal systolic function, with an ejection fraction  of 55-60%. The cavity size was normal. Left ventricular diastolic Doppler parameters are consistent with pseudonormal. No evidence of left ventricular regional wall motion  abnormalities.  2. The right ventricle has normal systolc function. The cavity was normal. There is no increase in right ventricular wall thickness.  3. No evidence of mitral valve stenosis. Trivial regurgitation.  4. The aortic valve is tricuspid No stenosis of the aortic valve.  5. The IVC was normal in size. PA systolic pressure 40 mmHg.  6. No definite vegetation was seen.  LE Venous US 07/23/18: Summary: Right: There is no evidence of deep vein thrombosis in the lower extremity.There is no evidence of superficial venous thrombosis. No cystic structure found in the popliteal fossa. Left: No evidence of common femoral vein obstruction  LLE [place] graft [place] 04/22/18 ([facility]): Conclusions: Left:  The fem-pop  bypass graft is patent with a hemodynamically significant stenosis noted in the proximal anastomosis and the proximal segment. Compared with previous exam, these results are essentially unchanged.   Past Medical History:  Diagnosis Date  . Anemia   . Arthritis   . Bronchitis   . C. difficile diarrhea 09/26/2014  . Cataracts, both eyes   . Cellulitis of right foot 06/02/2014  . CKD (chronic kidney disease) stage 3, GFR 30-59 ml/min    Dialysis T, TH, Sat  . Diabetic ulcer of right foot ([place]) 06/02/2014  . DVT (deep venous thrombosis) ([place]) 10/2014   one in each one in leg- PICC line , one in right and left arm  . H/O seasonal allergies   . Heart murmur    told once when she was pregnant- no furter mention- was in notes 11/2015- had Echo 8 /4/17  . History of blood transfusion   . Hypercholesteremia   . Hypertension   . Left foot infection   . Neuropathy    feet  . Neuropathy in diabetes ([place])   . Peripheral vascular disease ([place])   . Sleep apnea    not able to use CPAP  . Staphylococcus aureus bacteremia  10/2014  . Type 1 diabetes ([place])    onset age 67    Past Surgical History:  Procedure Laterality Date  . AMPUTATION TOE Left 07/24/2015   5th toe and Hallux amputation  . AV FISTULA PLACEMENT Left 08/06/2018   Procedure: INSERTION OF BASCILIC VEN TRANSPOSITION 1ST STAGE;  Surgeon: Rosetta Posner, MD;  Location: [place];  Service: Vascular;  Laterality: Left;  . CESAREAN SECTION    . EXOSTECTECTOMY TOE Left 04/11/2016   Procedure: EXOSTECTECTOMY CUBOID AND 5TH METATARSAL AND PLACEMENT OF ANTIBIOTIC BEADS;  Surgeon: Edrick Kins, DPM;  Location: [place];  Service: Podiatry;  Laterality: Left;  . EYE SURGERY Bilateral    [place]  . FEMORAL-POPLITEAL BYPASS GRAFT Left 05/03/2015  . FOOT AMPUTATION THROUGH METATARSAL Right 2016  . hemrrhoidectomy    . I & D EXTREMITY Right 06/10/2014   Procedure: IRRIGATION AND DEBRIDEMENT Right Foot;  Surgeon: Newt Minion, MD;  Location: [place];  Service: Orthopedics;  Laterality: Right;  . I & D EXTREMITY Right 07/31/2018   Procedure: IRRIGATION AND DEBRIDEMENT EXTREMITY;  Surgeon: Edrick Kins, DPM;  Location: [place];  Service: Podiatry;  Laterality: Right;  . I & D EXTREMITY Right 08/04/2018   Procedure: IRRIGATION AND DEBRIDEMENT FOOT with placement of wound vac;  Surgeon: Edrick Kins, DPM;  Location: [place];  Service: Podiatry;  Laterality: Right;  . INCISION AND DRAINAGE Left 04/11/2016   Procedure: INCISION AND DRAINAGE A DEEP COMPLICATED WOUND OF LEFT FOOT;  Surgeon: Edrick Kins, DPM;  Location: [place];  Service: Podiatry;  Laterality: Left;  . INSERTION OF DIALYSIS CATHETER Right 08/06/2018   Procedure: INSERTION OF TUNNELED DIALYSIS CATHETER;  Surgeon: Rosetta Posner, MD;  Location: [place];  Service: Vascular;  Laterality: Right;  . IR FLUORO GUIDE CV LINE RIGHT  09/24/2017  . IR FLUORO GUIDE CV LINE RIGHT  08/01/2018  . IR REMOVAL TUN CV CATH W/O FL  11/22/2017  . IR US GUIDE VASC ACCESS RIGHT  09/24/2017  . IR US GUIDE VASC ACCESS RIGHT  08/01/2018  . IRRIGATION  AND DEBRIDEMENT ABSCESS Left 05/06/2017   Procedure: IRRIGATION AND DEBRIDEMENT ABSCESS LEFT FOOT;  Surgeon: Edrick Kins, DPM;  Location[facility];  Service: Podiatry;  Laterality: Left;  . PERIPHERALLY INSERTED CENTRAL CATHETER INSERTION    . SKIN GRAFT Right 06/15/2014  . SKIN GRAFT Left 06/2015   foot   . SKIN SPLIT GRAFT Right 06/15/2014   Procedure: SPLIT THICKNESS SKIN GRAFT RIGHT FOOT;  Surgeon: Newt Minion, MD;  Location: [place];  Service: Orthopedics;  Laterality: Right;  . TRANSMETATARSAL AMPUTATION Right   . TRANSMETATARSAL AMPUTATION Right 09/11/2014    MEDICATIONS: No current facility-administered medications for this encounter.   Marland Kitchen albuterol (PROVENTIL HFA;VENTOLIN HFA) 108 (90 BASE) MCG/ACT inhaler  . APPLE CIDER VINEGAR PO  . aspirin 81 MG tablet  . B Complex-C-Zn-Folic Acid (DIALYVITE 073 WITH ZINC) 0.8 MG TABS  . calcitRIOL (ROCALTROL) 0.5 MCG capsule  . calcium acetate (PHOSLO) 667 MG capsule  . Cyanocobalamin (B-12) 5000 MCG CAPS  . FLUoxetine (PROZAC) 20 MG tablet  . FOLIC ACID PO  . insulin lispro (HUMALOG) 100 UNIT/ML injection  . norethindrone (AYGESTIN) 5 MG tablet  . Omega-3 Fatty Acids (OMEGA 3 500) 500 MG CAPS  . pravastatin (PRAVACHOL) 40 MG tablet  . sulfamethoxazole-trimethoprim (BACTRIM DS) 800-160 MG tablet  . amLODipine (NORVASC) 5 MG tablet  . Continuous Blood Gluc Receiver ([place]) [place]  . Continuous Blood Gluc Sensor (DEXCOM G6 SENSOR) MISC  . Continuous Blood Gluc Transmit (DEXCOM G6 TRANSMITTER) MISC  . CONTOUR NEXT TEST test strip  . insulin aspart (NOVOLOG) 100 UNIT/ML injection     Myra Gianotti, PA-C Surgical Short Stay/Anesthesiology [facility] Phone [phone] [facility] Phone [phone] 04/27/2019 4:19 PM

## 2019-04-28 ENCOUNTER — Other Ambulatory Visit (HOSPITAL_COMMUNITY): Payer: Medicare HMO

## 2019-04-29 ENCOUNTER — Encounter (HOSPITAL_COMMUNITY)
Admission: RE | Disposition: A | Discharge status: Home / Self Care | Payer: Self-pay | Source: Ambulatory Visit | Attending: Vascular Surgery

## 2019-04-29 ENCOUNTER — Ambulatory Visit (HOSPITAL_COMMUNITY): Payer: Medicare HMO | Admitting: Vascular Surgery

## 2019-04-29 ENCOUNTER — Encounter (HOSPITAL_COMMUNITY): Payer: Self-pay | Admitting: Vascular Surgery

## 2019-04-29 ENCOUNTER — Other Ambulatory Visit: Payer: Self-pay

## 2019-04-29 ENCOUNTER — Ambulatory Visit (HOSPITAL_COMMUNITY)
Admission: RE | Admit: 2019-04-29 | Discharge: 2019-04-29 | Disposition: A | Discharge status: Home / Self Care | Payer: Medicare HMO | Source: Ambulatory Visit | Attending: Vascular Surgery | Admitting: Vascular Surgery

## 2019-04-29 DIAGNOSIS — Z992 Dependence on renal dialysis: Secondary | ICD-10-CM | POA: Insufficient documentation

## 2019-04-29 DIAGNOSIS — M1990 Unspecified osteoarthritis, unspecified site: Secondary | ICD-10-CM | POA: Diagnosis not present

## 2019-04-29 DIAGNOSIS — Z7982 Long term (current) use of aspirin: Secondary | ICD-10-CM | POA: Diagnosis not present

## 2019-04-29 DIAGNOSIS — N186 End stage renal disease: Secondary | ICD-10-CM | POA: Diagnosis not present

## 2019-04-29 DIAGNOSIS — Z880 Allergy status to penicillin: Secondary | ICD-10-CM | POA: Insufficient documentation

## 2019-04-29 DIAGNOSIS — Z79899 Other long term (current) drug therapy: Secondary | ICD-10-CM | POA: Diagnosis not present

## 2019-04-29 DIAGNOSIS — Z888 Allergy status to other drugs, medicaments and biological substances status: Secondary | ICD-10-CM | POA: Diagnosis not present

## 2019-04-29 DIAGNOSIS — Z881 Allergy status to other antibiotic agents status: Secondary | ICD-10-CM | POA: Insufficient documentation

## 2019-04-29 DIAGNOSIS — E1022 Type 1 diabetes mellitus with diabetic chronic kidney disease: Secondary | ICD-10-CM | POA: Diagnosis not present

## 2019-04-29 DIAGNOSIS — Z793 Long term (current) use of hormonal contraceptives: Secondary | ICD-10-CM | POA: Diagnosis not present

## 2019-04-29 DIAGNOSIS — Z8349 Family history of other endocrine, nutritional and metabolic diseases: Secondary | ICD-10-CM | POA: Insufficient documentation

## 2019-04-29 DIAGNOSIS — Z87891 Personal history of nicotine dependence: Secondary | ICD-10-CM | POA: Insufficient documentation

## 2019-04-29 DIAGNOSIS — E1051 Type 1 diabetes mellitus with diabetic peripheral angiopathy without gangrene: Secondary | ICD-10-CM | POA: Diagnosis not present

## 2019-04-29 DIAGNOSIS — Z86718 Personal history of other venous thrombosis and embolism: Secondary | ICD-10-CM | POA: Diagnosis not present

## 2019-04-29 DIAGNOSIS — Z794 Long term (current) use of insulin: Secondary | ICD-10-CM | POA: Diagnosis not present

## 2019-04-29 DIAGNOSIS — E7800 Pure hypercholesterolemia, unspecified: Secondary | ICD-10-CM | POA: Diagnosis not present

## 2019-04-29 DIAGNOSIS — G4730 Sleep apnea, unspecified: Secondary | ICD-10-CM | POA: Diagnosis not present

## 2019-04-29 DIAGNOSIS — N185 Chronic kidney disease, stage 5: Secondary | ICD-10-CM

## 2019-04-29 HISTORY — PX: BASCILIC VEIN TRANSPOSITION: SHX5742

## 2019-04-29 HISTORY — DX: Myoneural disorder, unspecified: G70.9

## 2019-04-29 LAB — GLUCOSE, CAPILLARY
Glucose-Capillary: 431 mg/dL — ABNORMAL HIGH (ref 70–99)
Glucose-Capillary: >600 mg/dL (ref 70–99)
Glucose-Capillary: 284 mg/dL — ABNORMAL HIGH (ref 70–99)

## 2019-04-29 LAB — POCT I-STAT, CHEM 8
BUN: 45 mg/dL — ABNORMAL HIGH (ref 6–20)
Calcium, Ion: 1.1 mmol/L — ABNORMAL LOW (ref 1.15–1.40)
Chloride: 95 mmol/L — ABNORMAL LOW (ref 98–111)
Creatinine, Ser: 3.7 mg/dL — ABNORMAL HIGH (ref 0.44–1.00)
Glucose, Bld: 415 mg/dL — ABNORMAL HIGH (ref 70–99)
HCT: 33 % — ABNORMAL LOW (ref 36.0–46.0)
Hemoglobin: 11.2 g/dL — ABNORMAL LOW (ref 12.0–15.0)
Potassium: 5 mmol/L (ref 3.5–5.1)
Sodium: 134 mmol/L — ABNORMAL LOW (ref 135–145)
TCO2: 28 mmol/L (ref 22–32)

## 2019-04-29 LAB — SURGICAL PCR SCREEN
MRSA, PCR: NEGATIVE
Staphylococcus aureus: NEGATIVE

## 2019-04-29 LAB — HCG, SERUM, QUALITATIVE: Preg, Serum: NEGATIVE

## 2019-04-29 SURGERY — TRANSPOSITION, VEIN, BASILIC
Anesthesia: General | Site: Arm Upper | Laterality: Left

## 2019-04-29 MED ORDER — FENTANYL CITRATE (PF) 100 MCG/2ML IJ SOLN
INTRAMUSCULAR | Status: DC | PRN
  Administered 2019-04-29 (×2): 50 ug via INTRAVENOUS

## 2019-04-29 MED ORDER — PROPOFOL 1000 MG/100ML IV EMUL
INTRAVENOUS | Status: AC
  Filled 2019-04-29: qty 200

## 2019-04-29 MED ORDER — LIDOCAINE-EPINEPHRINE 0.5 %-1:200000 IJ SOLN
INTRAMUSCULAR | Status: AC
  Filled 2019-04-29: qty 1

## 2019-04-29 MED ORDER — PROPOFOL 10 MG/ML IV BOLUS
INTRAVENOUS | Status: AC
  Filled 2019-04-29: qty 40

## 2019-04-29 MED ORDER — VANCOMYCIN HCL IN DEXTROSE 1-5 GM/200ML-% IV SOLN
1000.0000 mg | INTRAVENOUS | Status: AC
  Administered 2019-04-29: 1000 mg via INTRAVENOUS
  Filled 2019-04-29: qty 200

## 2019-04-29 MED ORDER — ONDANSETRON HCL 4 MG/2ML IJ SOLN
INTRAMUSCULAR | Status: AC
  Filled 2019-04-29: qty 2

## 2019-04-29 MED ORDER — PROPOFOL 10 MG/ML IV BOLUS
INTRAVENOUS | Status: DC | PRN
  Administered 2019-04-29: 130 ug via INTRAVENOUS

## 2019-04-29 MED ORDER — PROPOFOL 500 MG/50ML IV EMUL
INTRAVENOUS | Status: DC | PRN
  Administered 2019-04-29: 35 ug/kg/min via INTRAVENOUS

## 2019-04-29 MED ORDER — ACETAMINOPHEN 160 MG/5ML PO SOLN: 1000.0000 mg | Freq: Once | ORAL | Status: DC | PRN

## 2019-04-29 MED ORDER — MIDAZOLAM HCL 5 MG/5ML IJ SOLN
INTRAMUSCULAR | Status: DC | PRN
  Administered 2019-04-29 (×2): 1 mg via INTRAVENOUS

## 2019-04-29 MED ORDER — PHENYLEPHRINE HCL-NACL 10-0.9 MG/250ML-% IV SOLN
INTRAVENOUS | Status: DC | PRN
  Administered 2019-04-29: 30 ug/min via INTRAVENOUS

## 2019-04-29 MED ORDER — MIDAZOLAM HCL 2 MG/2ML IJ SOLN
INTRAMUSCULAR | Status: AC
  Filled 2019-04-29: qty 2

## 2019-04-29 MED ORDER — LIDOCAINE-EPINEPHRINE 0.5 %-1:200000 IJ SOLN
INTRAMUSCULAR | Status: DC | PRN
  Administered 2019-04-29: 9 mL

## 2019-04-29 MED ORDER — SODIUM CHLORIDE 0.9 % IV SOLN: INTRAVENOUS | Status: DC

## 2019-04-29 MED ORDER — OXYCODONE HCL 5 MG PO TABS
ORAL_TABLET | ORAL | Status: AC
  Administered 2019-04-29: 11:00:00 5 mg via ORAL
  Filled 2019-04-29: qty 1

## 2019-04-29 MED ORDER — ACETAMINOPHEN 10 MG/ML IV SOLN: 1000.0000 mg | Freq: Once | INTRAVENOUS | Status: DC | PRN

## 2019-04-29 MED ORDER — SODIUM CHLORIDE 0.9 % IV SOLN
INTRAVENOUS | Status: AC
  Filled 2019-04-29: qty 1.2

## 2019-04-29 MED ORDER — CHLORHEXIDINE GLUCONATE 4 % EX LIQD: 60.0000 mL | Freq: Once | CUTANEOUS | Status: DC

## 2019-04-29 MED ORDER — 0.9 % SODIUM CHLORIDE (POUR BTL) OPTIME
TOPICAL | Status: DC | PRN
  Administered 2019-04-29: 1000 mL

## 2019-04-29 MED ORDER — PROPOFOL 500 MG/50ML IV EMUL
INTRAVENOUS | Status: AC
  Filled 2019-04-29: qty 100

## 2019-04-29 MED ORDER — FENTANYL CITRATE (PF) 100 MCG/2ML IJ SOLN: 25.0000 ug | INTRAMUSCULAR | Status: DC | PRN

## 2019-04-29 MED ORDER — LIDOCAINE 2% (20 MG/ML) 5 ML SYRINGE
INTRAMUSCULAR | Status: AC
  Filled 2019-04-29: qty 5

## 2019-04-29 MED ORDER — OXYCODONE-ACETAMINOPHEN 5-325 MG PO TABS: 1.0000 | ORAL_TABLET | Freq: Four times a day (QID) | ORAL | 0 refills | Status: DC | PRN

## 2019-04-29 MED ORDER — LIDOCAINE HCL (CARDIAC) PF 100 MG/5ML IV SOSY
PREFILLED_SYRINGE | INTRAVENOUS | Status: DC | PRN
  Administered 2019-04-29: 60 mg via INTRAVENOUS

## 2019-04-29 MED ORDER — ACETAMINOPHEN 500 MG PO TABS: 1000.0000 mg | ORAL_TABLET | Freq: Once | ORAL | Status: DC | PRN

## 2019-04-29 MED ORDER — SODIUM CHLORIDE 0.9 % IV SOLN
INTRAVENOUS | Status: DC | PRN
  Administered 2019-04-29: 500 mL

## 2019-04-29 MED ORDER — OXYCODONE HCL 5 MG PO TABS: 5.0000 mg | ORAL_TABLET | Freq: Once | ORAL | Status: AC | PRN

## 2019-04-29 MED ORDER — OXYCODONE HCL 5 MG/5ML PO SOLN: 5.0000 mg | Freq: Once | ORAL | Status: AC | PRN

## 2019-04-29 MED ORDER — FENTANYL CITRATE (PF) 250 MCG/5ML IJ SOLN
INTRAMUSCULAR | Status: AC
  Filled 2019-04-29: qty 5

## 2019-04-29 SURGICAL SUPPLY — 36 items
ADH SKN CLS APL DERMABOND .7 (GAUZE/BANDAGES/DRESSINGS) ×2
ARMBAND PINK RESTRICT EXTREMIT (MISCELLANEOUS) ×2 IMPLANT
CANISTER SUCT 3000ML PPV (MISCELLANEOUS) ×2 IMPLANT
CANNULA VESSEL 3MM 2 BLNT TIP (CANNULA) ×2 IMPLANT
CLIP LIGATING EXTRA MED SLVR (CLIP) ×2 IMPLANT
CLIP LIGATING EXTRA SM BLUE (MISCELLANEOUS) ×2 IMPLANT
COVER PROBE W GEL 5X96 (DRAPES) ×2 IMPLANT
COVER WAND RF STERILE (DRAPES) ×2 IMPLANT
DECANTER SPIKE VIAL GLASS SM (MISCELLANEOUS) ×2 IMPLANT
DERMABOND ADVANCED (GAUZE/BANDAGES/DRESSINGS) ×2
DERMABOND ADVANCED .7 DNX12 (GAUZE/BANDAGES/DRESSINGS) ×1 IMPLANT
ELECT REM PT RETURN 9FT ADLT (ELECTROSURGICAL) ×2
ELECTRODE REM PT RTRN 9FT ADLT (ELECTROSURGICAL) ×1 IMPLANT
GLOVE BIOGEL PI IND STRL 6.5 (GLOVE) IMPLANT
GLOVE BIOGEL PI IND STRL 7.5 (GLOVE) IMPLANT
GLOVE BIOGEL PI INDICATOR 6.5 (GLOVE) ×3
GLOVE BIOGEL PI INDICATOR 7.5 (GLOVE) ×1
GLOVE ECLIPSE 7.0 STRL STRAW (GLOVE) ×1 IMPLANT
GLOVE SS BIOGEL STRL SZ 7.5 (GLOVE) ×1 IMPLANT
GLOVE SUPERSENSE BIOGEL SZ 7.5 (GLOVE) ×1
GLOVE SURG SS PI 6.5 STRL IVOR (GLOVE) ×1 IMPLANT
GOWN STRL NON-REIN LRG LVL3 (GOWN DISPOSABLE) ×1 IMPLANT
GOWN STRL REUS W/ TWL LRG LVL3 (GOWN DISPOSABLE) ×3 IMPLANT
GOWN STRL REUS W/TWL LRG LVL3 (GOWN DISPOSABLE) ×8
KIT BASIN OR (CUSTOM PROCEDURE TRAY) ×2 IMPLANT
KIT TURNOVER KIT B (KITS) ×2 IMPLANT
NS IRRIG 1000ML POUR BTL (IV SOLUTION) ×2 IMPLANT
PACK CV ACCESS (CUSTOM PROCEDURE TRAY) ×2 IMPLANT
PAD ARMBOARD 7.5X6 YLW CONV (MISCELLANEOUS) ×4 IMPLANT
SUT PROLENE 6 0 CC (SUTURE) ×3 IMPLANT
SUT SILK 2 0 SH (SUTURE) IMPLANT
SUT VIC AB 3-0 SH 27 (SUTURE) ×4
SUT VIC AB 3-0 SH 27X BRD (SUTURE) ×1 IMPLANT
TOWEL GREEN STERILE (TOWEL DISPOSABLE) ×2 IMPLANT
UNDERPAD 30X30 (UNDERPADS AND DIAPERS) ×2 IMPLANT
WATER STERILE IRR 1000ML POUR (IV SOLUTION) ×2 IMPLANT

## 2019-04-29 NOTE — Discharge Instructions (Addendum)
° °  [facility] ° °Discharge Instructions ° °AV Fistula or Graft Surgery for Dialysis Access ° °Please refer to the following instructions for your post-procedure care. Your surgeon or physician assistant will discuss any changes with you. ° °Activity ° °You may drive the day following your surgery, if you are comfortable and no longer taking prescription pain medication. Resume full activity as the soreness in your incision resolves. ° °Bathing/Showering ° °You may shower after you go home. Keep your incision dry for 48 hours. Do not soak in a bathtub, hot tub, or swim until the incision heals completely. You may not shower if you have a hemodialysis catheter. ° °Incision Care ° °Clean your incision with mild soap and water after 48 hours. Pat the area dry with a clean towel. You do not need a bandage unless otherwise instructed. Do not apply any ointments or creams to your incision. You may have skin glue on your incision. Do not peel it off. It will come off on its own in about one week. Your arm may swell a bit after surgery. To reduce swelling use pillows to elevate your arm so it is above your heart. Your doctor will tell you if you need to lightly wrap your arm with an ACE bandage. ° °Diet ° °Resume your normal diet. There are not special food restrictions following this procedure. In order to heal from your surgery, it is CRITICAL to get adequate nutrition. Your body requires vitamins, minerals, and protein. Vegetables are the best source of vitamins and minerals. Vegetables also provide the perfect balance of protein. Processed food has little nutritional value, so try to avoid this. ° °Medications ° °Resume taking all of your medications. If your incision is causing pain, you may take over-the counter pain relievers such as acetaminophen (Tylenol). If you were prescribed a stronger pain medication, please be aware these medications can cause nausea and constipation. Prevent  nausea by taking the medication with a snack or meal. Avoid constipation by drinking plenty of fluids and eating foods with high amount of fiber, such as fruits, vegetables, and grains. Do not take Tylenol if you are taking prescription pain medications. ° ° ° ° °Follow up °Your surgeon may want to see you in the office following your access surgery. If so, this will be arranged at the time of your surgery. ° °Please call us immediately for any of the following conditions: ° °Increased pain, redness, drainage (pus) from your incision site °Fever of 101 degrees or higher °Severe or worsening pain at your incision site °Hand pain or numbness. ° °Reduce your risk of vascular disease: ° °Stop smoking. If you would like help, call [facility] at 1-800-QUIT-NOW (1-800-784-8669) or Rafael Gonzalez at [phone] ° °Manage your cholesterol °Maintain a desired weight °Control your diabetes °Keep your blood pressure down ° °Dialysis ° °It will take several weeks to several months for your new dialysis access to be ready for use. Your surgeon will determine when it is OK to use it. Your nephrologist will continue to direct your dialysis. You can continue to use your Permcath until your new access is ready for use. ° °If you have any questions, please call the office at [phone]. ° °

## 2019-04-29 NOTE — Transfer of Care (Signed)
Immediate Anesthesia Transfer of Care Note  Patient: [name]  Procedure(s) Performed: SECOND STAGE BASCILIC VEIN TRANSPOSITION LEFT ARM (Left Arm Upper)  Patient Location: PACU  Anesthesia Type:General  Level of Consciousness: awake, alert , oriented and sedated  Airway & Oxygen Therapy: Patient Spontanous Breathing and Patient connected to face mask oxygen  Post-op Assessment: Report given to RN, Post -op Vital signs reviewed and stable and Patient moving all extremities  Post vital signs: Reviewed and stable  Last Vitals:  Vitals Value Taken Time  BP    Temp    Pulse 73 04/29/19 1001  Resp 13 04/29/19 1001  SpO2 100 % 04/29/19 1001  Vitals shown include unvalidated device data.  Last Pain:  Vitals:   04/29/19 0633  TempSrc: Oral  PainSc: 0-No pain      Patients Stated Pain Goal: 3 (28/20/60 1561)  Complications: No apparent anesthesia complications

## 2019-04-29 NOTE — Anesthesia Procedure Notes (Signed)
Procedure Name: LMA Insertion Date/Time: 04/29/2019 8:07 AM Performed by: Larene Beach, CRNA Pre-anesthesia Checklist: Patient identified, Emergency Drugs available, Suction available and Patient being monitored Patient Re-evaluated:Patient Re-evaluated prior to induction Oxygen Delivery Method: Circle system utilized Preoxygenation: Pre-oxygenation with 100% oxygen Induction Type: IV induction Ventilation: Mask ventilation without difficulty LMA: LMA inserted LMA Size: 4.0 Number of attempts: 1 Airway Equipment and Method: Bite block Placement Confirmation: positive ETCO2,  CO2 detector and breath sounds checked- equal and bilateral Tube secured with: Tape Dental Injury: Teeth and Oropharynx as per pre-operative assessment

## 2019-04-29 NOTE — Progress Notes (Signed)
Upon checking the cbg, pt's results read "Hi" on the machine. A second sample obtained on the left hand at 431. I-Stat 8 obtained at 0650 bg 415. Pt sts she turned off and removed her insulin pump at midnight. When she checked her bs this am it was 600. Pt sts she then gave herself 6.4units pta. Dr. Donnetta Hutching and Dr. Ermalene Postin from anesthesia made aware. Pt advised to put insulin back on and run at her basal rate. Glucose will be monitored back in the O.R., per Dr. Ermalene Postin.

## 2019-04-29 NOTE — Op Note (Signed)
    OPERATIVE REPORT  DATE OF SURGERY: 04/29/2019  PATIENT: [name], 49 y.o. female MRN: [id number]  DOB: 07-26-1970  PRE-OPERATIVE DIAGNOSIS: End-stage renal disease  POST-OPERATIVE DIAGNOSIS:  Same  PROCEDURE: Left second stage basilic vein transposition  SURGEON:  Curt Jews, M.D.  PHYSICIAN ASSISTANT: Laurence Slate, PA-C  ANESTHESIA: LMA  EBL: per anesthesia record  Total I/O In: -  Out: 75 [Blood:75]  BLOOD ADMINISTERED: none  DRAINS: none  SPECIMEN: none  COUNTS CORRECT:  YES  PATIENT DISPOSITION:  PACU - hemodynamically stable  PROCEDURE DETAILS: The patient was taken to the operating placed supine position where the area of the left arm and left axilla were prepped and draped in the usual sterile fashion.  SonoSite ultrasound was used to visualize the basilic vein from the brachial artery anastomosis to the axilla.  Incision was made over the prior brachial artery to basilic vein fistula anastomosis and the vein was exposed.  The artery was exposed proximally distally as well.  The artery was a small and was extremely calcified.  There was good flow in the fistula.  Separate incisions were made in the medial aspect of the upper arm and the vein was exposed from the antecubital space to the axilla.  Tributary branches were ligated with 3-0 and 4-0 silk ties and divided.  The vein was transected near the arterial anastomosis and the vein was brought through the tunnel.  The vein was marked to reduce risk of twisting.  A new tunnel was created from the antecubital space to the axilla and the vein was brought through this tunnel.  The vein was spatulated proximally distally and was sewn into into itself with a running 6-0 Prolene suture.  Clamps removed and excellent thrill was noted.  Wounds irrigated with saline.  Hemostasis obtained after cautery.  Wounds were closed with 3-0 Vicryl in the subcutaneous and subcuticular tissue.  Sterile dressing was applied patient  was transferred to the recovery room in stable condition   Rosetta Posner, M.D., [facility] 04/29/2019 9:46 AM

## 2019-04-29 NOTE — Interval H&P Note (Signed)
History and Physical Interval Note:  04/29/2019 6:48 AM  [name]  has presented today for surgery, with the diagnosis of END STAGE RENAL DISEASE FOR HEMODIALYSIS ACCESS.  The various methods of treatment have been discussed with the patient and family. After consideration of risks, benefits and other options for treatment, the patient has consented to  Procedure(s): [place] (Left) as a surgical intervention.  The patient's history has been reviewed, patient examined, no change in status, stable for surgery.  I have reviewed the patient's chart and labs.  Questions were answered to the patient's satisfaction.     Curt Jews

## 2019-04-30 ENCOUNTER — Encounter: Payer: Self-pay | Admitting: *Deleted

## 2019-05-04 ENCOUNTER — Encounter: Payer: Self-pay | Admitting: *Deleted

## 2019-05-04 ENCOUNTER — Other Ambulatory Visit: Payer: Self-pay

## 2019-05-04 ENCOUNTER — Encounter (HOSPITAL_BASED_OUTPATIENT_CLINIC_OR_DEPARTMENT_OTHER): Payer: Medicare HMO | Admitting: Internal Medicine

## 2019-05-04 DIAGNOSIS — E10610 Type 1 diabetes mellitus with diabetic neuropathic arthropathy: Secondary | ICD-10-CM | POA: Diagnosis not present

## 2019-05-04 DIAGNOSIS — Z87891 Personal history of nicotine dependence: Secondary | ICD-10-CM | POA: Diagnosis not present

## 2019-05-04 DIAGNOSIS — E1051 Type 1 diabetes mellitus with diabetic peripheral angiopathy without gangrene: Secondary | ICD-10-CM | POA: Diagnosis not present

## 2019-05-04 DIAGNOSIS — Z8614 Personal history of Methicillin resistant Staphylococcus aureus infection: Secondary | ICD-10-CM | POA: Insufficient documentation

## 2019-05-04 DIAGNOSIS — Z9641 Presence of insulin pump (external) (internal): Secondary | ICD-10-CM | POA: Diagnosis not present

## 2019-05-04 DIAGNOSIS — Z86718 Personal history of other venous thrombosis and embolism: Secondary | ICD-10-CM | POA: Insufficient documentation

## 2019-05-04 DIAGNOSIS — Z794 Long term (current) use of insulin: Secondary | ICD-10-CM | POA: Insufficient documentation

## 2019-05-04 DIAGNOSIS — E10621 Type 1 diabetes mellitus with foot ulcer: Secondary | ICD-10-CM | POA: Insufficient documentation

## 2019-05-04 DIAGNOSIS — E1069 Type 1 diabetes mellitus with other specified complication: Secondary | ICD-10-CM | POA: Diagnosis not present

## 2019-05-04 DIAGNOSIS — Z992 Dependence on renal dialysis: Secondary | ICD-10-CM | POA: Insufficient documentation

## 2019-05-04 DIAGNOSIS — M85872 Other specified disorders of bone density and structure, left ankle and foot: Secondary | ICD-10-CM | POA: Diagnosis not present

## 2019-05-04 DIAGNOSIS — L97514 Non-pressure chronic ulcer of other part of right foot with necrosis of bone: Secondary | ICD-10-CM | POA: Insufficient documentation

## 2019-05-04 DIAGNOSIS — E1022 Type 1 diabetes mellitus with diabetic chronic kidney disease: Secondary | ICD-10-CM | POA: Diagnosis not present

## 2019-05-04 DIAGNOSIS — N189 Chronic kidney disease, unspecified: Secondary | ICD-10-CM | POA: Diagnosis not present

## 2019-05-04 DIAGNOSIS — Z9119 Patient's noncompliance with other medical treatment and regimen: Secondary | ICD-10-CM | POA: Diagnosis not present

## 2019-05-04 DIAGNOSIS — Z89422 Acquired absence of other left toe(s): Secondary | ICD-10-CM | POA: Diagnosis not present

## 2019-05-04 DIAGNOSIS — M86671 Other chronic osteomyelitis, right ankle and foot: Secondary | ICD-10-CM | POA: Diagnosis not present

## 2019-05-04 DIAGNOSIS — L97522 Non-pressure chronic ulcer of other part of left foot with fat layer exposed: Secondary | ICD-10-CM | POA: Insufficient documentation

## 2019-05-04 NOTE — Progress Notes (Signed)
[name], [name] ([id number]) Visit Report for 05/04/2019 HPI Details Patient Name: Date of Service: [name], [name] 05/04/2019 1:00 PM Medical Record Number:[id number] Patient Account Number: [id number] Date of Birth/Sex: Treating RN: 1970/05/05 (49 y.o. F) Primary Care Provider: [name] Other Clinician: Referring Provider: Treating Provider/Extender:Tawnya Pujol, [name], [name] in Treatment: 6 History of Present Illness HPI Description: 49 year old diabetic who is known to have type 1 diabetes which is poorly controlled last hemoglobin A1c was 11%. She comes in with a ulcerated area on the left lateral foot which has been there for over 6 months. Was recently she has been treated by [name] of podiatry who saw her last on 05/28/2016. Review of his notes revealed that the patient had incision and drainage with placement of antibiotic beads to the left foot on 04/11/2016 for possible osteomyelitis of the cuboid bone. Over the last year she's had a history of amputation of the left fifth toe and a femoropopliteal popliteal bypass graft somewhere in April 2017. 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 His note [name] mentions that the patient has been referred to me for further wound care and possibly great candidate for hyperbaric oxygen therapy due to recurrent osteomyelitis. However we do not have any x-rays of biopsy reports confirming this. He has been on several antibiotics including Bactrim and most recently is on doxycycline for an MRSA. I understand, the patient was not a candidate for IV antibiotics as she has had previous PICC lines which resulted in blood clots in both arms. There was a x-ray report dated 04/04/2016 on [name] notes which showed evidence of fifth ray resection left foot with osteolytic changes noted to the fourth metatarsal and cuboid bone on the left. 06/13/2016 -- had a left foot x-ray which showed no acute fracture  or dislocation and no definite radiographic evidence of osteomyelitis. Advanced osteopenia was seen. 06/20/2016 -- she has noticed a new wound on the right plantar foot in the region where she had a callus before. 06/27/16- the patient did have her x-ray of the right foot which showed no findings to suggest osteomyelitis. She saw her endocrinologist, [name], yesterday. Her A1c in January was 11. He also indicates mismanagement and noncompliance regarding her diabetes. She is currently on Bactrim for a lip infection. She is complaining of nausea, vomiting and diarrhea. She is unable to articulate the exact orders or dosing of the Bactrim; it is unclear when she will complete this. 07/04/2016 -- results from [facility] of ABIs with ankle waveforms were noted from 02/14/2016. The examination done on 06/27/2015 showed noncompressible ABIs with the right being 1.45 and the left being 1.33. The present examination showed a right ABI of 1.19 on the left of 1.33. The conclusion was that right normal ABI in the lower extremity at rest however compared to previous study which was noncompressible ABI may be falsely elevated side suggesting medial calcification. The left ABI suggested medial calcification. 08/01/2016 -- the patient had more redness and pain on her right foot and did not get to come to see as noted she see her PCP or go to the ER and decided to take some leftover metronidazole which she had at home. As usual, the patient does report she feels and is rather noncompliant. 08/08/2016 -- -- x-ray of the right foot -- FINDINGS:Transmetatarsal amputation is noted. No bony destruction is noted to suggest osteomyelitis. IMPRESSION: No evidence of osteomyelitis. Postsurgical changes are seen. MRI would be more sensitive for possible  bony changes. Culture has grown Serratia Marcescens -- sensitive to Bactrim, ciprofloxacin, ceftazidime she was seen by [name] on 08/06/2016. He did not  find any exposed bone, muscle, tendon, ligament or joint. There was no malodor and he did a excisional debridement in the office. ============ Old notes: 49 year old patient who is known to the wound clinic for a while had been away from the wound clinic since 09/01/2014. Over the last several months she has been admitted to various hospitals including [facility]. She was treated for a right metatarsal osteomyelitis with a transmetatarsal amputation and this was done about 2 months ago. He has a small ulcerated area on the right heel and she continues to have an ulcerated area on the left plantar aspect of the foot. The patient was recently admitted to the [facility] hospital group between 7/12 and 10/18/2014. she was given 3 weeks of IV vancomycin and was to follow-up with her surgeons at [facility] and also took oral vancomycin for C. difficile colitis. 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 MRI of the left foot done on 10/14/2014 showed no abscess or osteomyelitis. 04/27/15; this is a patient we know from previous stays in the wound care center. She is a type I diabetic I am not sure of her control currently. Since the last time I saw her she is had a right transmetatarsal amputation and has no wounds on her right foot and has no open wounds. She is been followed at the wound care center at [facility] in [place]. She comes today with the desire to undergo hyperbaric treatment locally. Apparently one of her wound care providers in [place] has suggested hyperbarics. This is in response to an MRI from 04/18/15 that showed increased marrow signal and loss of the proximal fifth metatarsal cortex  evidence of osteomyelitis with likely early osteomyelitis in the cuboid bone as well. She has a large wound over the base of the fifth metatarsal. She also has a eschar over her the tips of her toes on 1,3 and 5. She does not have peripheral pulses and apparently is going for an angiogram tomorrow which seems reasonable. After this she is going to infectious disease at [facility]. They have been using Medihoney to the large wound on the lateral aspect of the left foot to. The patient has known Charcot deformity from diabetic neuropathy. She also has known diabetic PAD. Surprisingly I can't see that she has had any recent antibiotics, the patient states the last antibiotic she had was at the end of November for 10 days. I think this was in response to culture that showed group G strep although I'm not exactly sure where the culture was from. She is also had arterial studies on 03/29/15. This showed a right ABI of 1.4 that was noncompressible. Her left ABI was 0.73. 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 It was not felt that arterial inflow was adequate for healing of a foot ulcer. Her Doppler waveforms looked monophasic ===== READMISSION 02/28/17; this is in an now 49 year old woman we've had at several different occasions in this clinic. 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 She has a remote ex-smoker. She was last seen in this clinic by  Dr. Con Memos I think in May. More recently she is been followed by her podiatrist [name] an infectious disease Dr. Megan Salon. She has 2 open wounds the major one is over the right first metatarsal head she also has a wound on the left plantar foot. an MRI of the right foot on 01/01/17 showed a soft tissue ulcer along the plantar aspect of the first metatarsal base consistent with osteomyelitis of the first metatarsal stump. Dr. 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 According to the patient this is been open for slightly over a month. She has been on a combination of Cipro 500 twice a day, Zyvox 600 twice a day and Flagyl 500 3 times a day for over a month now as directed by Dr. Megan Salon. cultures of the right foot earlier this year showed MRSA in January and Serratia in May. January also had a few viridans strep. Recent x-rays of both feet were done and [name] office and I don't have these reports. The patient has known PAD and has a history of aleft femoropopliteal bypass in April 2017. 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 Dwyane Dee. In spite of this the last hemoglobin A1c I can see is 10.1 on 01/01/2017. She is being referred by 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 She is been using Medihoney to this area and also an area on the left midfoot. She is using healing sandals bilaterally. ABIs in this clinic at the left posterior tibial was 1.1 noncompressible on the right READMISSION Non invasive vascular NOVANT 5/18 Aftercare following surgery of the circulatory system Procedure Note - Interface, External Ris In - 08/13/2016 11:05 AM EDT Procedure: Examination consists of physiologic resting arterial pressures of the brachial and ankle arteries bilaterally with continuous wave Doppler waveform analysis. Previous: Previous exam performed on 02/14/16 demonstrated ABIs of Rt = 1.19 and Lt = 1.33. Right: ABI = non-compressible PT, 1.47 DP. S/P transmet amputation. Left: ABI = 1.52, 2nd digit pressure = 87 mmHg Conclusions: Right: ABI (>1.3) may be falsely elevated, suggesting medial calcification. Left: ABI (>1.3) may be falsely elevated, suggesting  medial calcification The patient is a now 49 year old type I diabetic is had multiple issues her graded to chronic diabetic foot ulcers. 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 We had her in the clinic lastin November. At that point she had wounds on her bilateral feet.she had wanted to try hyperbarics however the healogics review process denied her because she hadn't followed up with her vascular surgeon for her left femoropopliteal bypass. The bypass was done by Dr. Raul Del at [facility]. We made her a follow-up with Dr. Raul Del however she did not keep the appointment and therefore she was not approved The patient shows me a small wound on her left fourth metatarsal head on her phone. She developed rapid discoloration in the plantar aspect of the left foot and she was admitted to hospital from 2/2 through 05/10/17 with wet gangrene of the left foot osteomyelitis of the fourth metatarsal heads. She was admitted acutely ill with a temperature of 103. She was started on broad-spectrum vancomycin and cefepime. On 05/06/17 she was  taken to the OR by [name] her podiatric surgeon for an incision and drainage irrigation of the left foot wound. Cultures from this surgery revealed group be strep and anaerobes. she was seen by Dr.Xu of orthopedic surgery and scheduled for a below-knee amputation which she u refused. Ultimately she was discharged on Levaquin and Flagyl for one month. MRI 05/05/17 done while she was in the hospital showed abscess adjacent to the fourth metatarsal head and neck small abscess around the fourth flexor tendon. Inflammatory phlegmon and gas in the soft tissues along the lateral aspect of the fourth phalanx. Findings worrisome for osteomyelitis involving the fourth proximal and middle phalanx and also the third and fourth metatarsals. Finally the patient had actually shortly before this followed  up with Dr. Raul Del at no time on 04/29/17. He felt that her left femoropopliteal bypass was patent he felt that her left-sided toe pressures more than adequate for healing a wound on the left foot. This was before her acute presentation. 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 05/28/17; she is started hyperbarics. The patient tells me that for some reason she was not actually on Levaquin but I think on ciprofloxacin. She was on Flagyl. She only started her Levaquin yesterday due to some difficulty with the pharmacy and perhaps her sister picking it up. She has an appointment with [name] tomorrow and with infectious disease early next week. She has no new complaints 06/06/17; the patient continues in hyperbarics. She saw [name] on 05/29/17 who is her podiatric surgeon. He is elected for a transmetatarsal amputation on 06/27/17. I'm not sure at what level he plans to do this amputation. The patient is unaware She also saw Dr. Megan Salon of infectious disease who elected to continue her on current antibiotics I think this is ciprofloxacin and Flagyl. I'll need to clarify with her tomorrow if she actually has this. We're using silver alginate to the actual wound. Necrotic surface today with material under the flap of her foot. Original MRI showed abscesses as well as osteomyelitis of the proximal and middle fourth phalanx and the third and fourth metatarsal heads 06/11/17; patient continues in hyperbarics and continues on oral antibiotics. She is doing well. The wound looks better. The necrotic part of this under the flap in her superior foot also looks better. she is been to see [name]. I haven't had a chance to look at his note. Apparently he has put the transmetatarsal amputation on hold her request it is still planning to take her to the OR for debridement and product application ACEL. I'll see if I can find his note. I'll therefore leave product ordering/requests to [name] for now. I was  going to look at Dermagraft 06/18/17-she is here in follow-up evaluation for bilateral foot wounds. She continues with hyperbaric therapy. She states she has been applying manuka honey to the right plantar foot and alternate manuka honey and silver alginate to the left foot, despite our orders. We will continue with same treatment plan and she will follo up next week. 06/25/17; I have reviewed [name] last note from 3/11. She has operative debridement in 2 days' time. By review his note apparently they're going to place there is skin over the majority of this wound which is a good choice. She has a small satellite area at the most proximal part of this wound on the left plantar foot. The area on the right plantar foot we've been using silver alginate and it is close to  healing. 07/02/17; unfortunately the patient was not easily approved for [name] proposed surgery. I'm not completely certain what the issue is. She has been using silver alginate to the wound she has completed a first course of hyperbarics. She is still on Levaquin and Flagyl. I have really lost track of the time course here.I suspect she should have another week to 2 of antibiotics. I'll need to see if she is followed up with infectious disease Dr. Megan Salon 07/09/17; the patient is followed up with Dr. Megan Salon. 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 She continues on Levaquin and metronidazole continuing both of these for now I think she is been on fr about 6 weeks. She still has some drainage but no pain. No fever. Her had been plans for her to go to the OR for operative debridement with her podiatrist [name], I am not exactly sure where that is. I'll probably slip a note to [name] today. I note that she follows with Dr. Dwyane Dee of endocrinology. We have her recertified for hyperbaric oxygen. I have not heard about Dermagraft however I'll see if [name] is planning a skin  substitute as well 07/16/17; the patient tells me she is just about out of [place]. I'll need to check Dr. Hale Bogus last notes on this. She states she has plenty of Flagyl however. She comes in today complaining of pain in the right lateral foot which she said lasted for about a day. The wound on the right foot is actually much more medially. She also tells me that the [facility] cost a lot of pain in the left foot wound and she turned back to silver alginate. Finally Dermagraft has a $867 per application co-pay. She cannot afford this 07/23/17; patient arrives today with the wound not much smaller. There is not much new to add. She has not heard from [name] all try to put in a call to them today. She was asking about Dermagraft again and she has an over $619 per application co-pay she states that she would be willing to try to do a payment plan. I been tried to avoid this. We've been using silver alginate, I'll change to [facility] 07/30/17-She is here in follow-up evaluation for left foot ulcer. She continues hyperbaric medicine. The left foot ulcer is stable we will continue with same treatment plan 08/06/17; she is here for evaluation of her left foot ulcer. Currently being treated for hyperbarics or underlying osteomyelitis. She is completed antibiotics. The left foot ulcer is better smaller with healthier looking granulation. For various reasons I am not really clear on we never got her back to the OR with [name]. He did not respond to my secure text message. Nevertheless I think that surgery on this point is not necessary nor am I completely clear that a skin substitute is necessary The patient is complaining about pain on the outside of her right foot. 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 There is no erythema. She also states the foot is warm versus her other part of her upper leg and this is largely true. It is not totally clear to me what's causing this. She  thinks it's different from her usual neuropathy pain 08/13/17; she arrives in clinic today with a small wound which is superficial on her right first metatarsal head. 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 She tells [place] she was up on her feet  over the Mother's Day celebration. The large wound is on the left foot. Continues with hyperbarics for underlying osteomyelitis. We're using Hydrofera Blue. She asked me today about where we were with Dermagraft. I had actually excluded this because of the co- pay however she wants to assume this therefore I'll recheck the co-pay an order for next week. 08/20/17; the patient agreed to accept the co-pay of the first Apligraf which we applied today. She is disappointed she is finishing hyperbarics will run this through the insurance on the extent of the foot infection and the extent of the wound that she had however she is already had 60 dive's. Dermagraft No. 1 08/27/17; Dermagraft No. 2. She is not eligible for any more hyperbaric treatments this month. She reports a fair amount of drainage and she actually changed to the external dressings without disturbing the direct contact layer 09/03/17; the patient arrived in clinic today with the wound superficially looking quite healthy. Nice vibrant red tissue with some advancing epithelialization although not as much adherence of the flap as I might like. However she noted on her own fourth toe some bogginess and she brought that to our attention. Indeed this was boggy feeling like a possibility of subcutaneous fluid. 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 She is not been unwell. We've been using Dermagraft 09/10/17; the culture that I did not last week was MRSA. She saw Dr. Megan Salon this morning who is going to start her on vancomycin. I had sent him a secure a text message yesterday. I also spoke with her podiatric  surgeon [name] about surgery on this foot the options for conserving a functional foot etc. Promised me he would see her and will make back consultation today. Paradoxically her actual wound on the plantar aspect of her left foot looks really quite good. I had given her 5 days worth of Baxdella to cover her for MRSA. Her MRI came back showing osteomyelitis within the third metatarsal shaft and head and base of the third and fourth proximal phalanx. She had extensive inflammatory changes throughout the soft tissue of the lateral forefoot. With an ill-defined fluid around the fourth metatarsal extending into the plantar and dorsal soft tissues 09/19/17; the patient is actually on oral Septra and Flagyl. She apparently refused IV vancomycin. She also saw [name] at my request who is planning her for a left BKA sometime in mid July. MRI showed osteomyelitis within the third metatarsal shaft and head and the basis of the third and fourth proximal phalanx. I believe there was felt to be possible septic arthritis involving the third MTP. 09/26/17; the patient went back to Dr. Megan Salon at my suggestion and is now receiving IV daptomycin. Her wound continues to look quite good making the decision to proceed with a transmetatarsal amputation although more difficult for the patient. I believe in my extensive discussions with her she has a good sense of the pros and cons of this. I don't NV the tuft decision she has to make. She has an appointment with [name] I believe in mid July and I previously spoken to him about this issue Has we had used 3 previous Dermagraft. Given the condition of the wound surface I went ahead and added the fourth one today area and I did this not fully realizing that she'll be traveling to [place] next week. I'm hopeful she can come back in  2 weeks 10/21/17; Her same Dermagraft on for about 3-1/2 weeks. In spite of this the wound arrives looking quite healthy. There is been a  lot of healing dimensions are smaller. Looking at the square shaped wound she has now there is some undermining and some depth medially under the undermining although I cannot palpate any bone. No surrounding infection is obvious. She has difficult questions about how to look at this going forward vis--vis amputations versus continued medical therapy. To be truthful the wound is looks so healthy and it is continued to contract. Hard to justify foot surgery at this point although I still told her that I think it might come to that if we are not able to eradicate the underlying MRSA. She is still highly at risk and she understands this 11/06/17 on evaluation today patient appears to be doing better in regard to her foot ulcer. She's been tolerating the dressing changes without complication. Currently she is here for her Dermagraft #6. Her wound continues to make excellent progress at this point. She does not appear to have any evidence of infection which is good news. 11/13/17 on evaluation today patient appears to be doing excellent at this time. She is here for repeat Dermagraft application. This is #7. Overall her wound seems to be making great progress. 12/05/17; the patient arrives with the wound in much better condition than when I last saw this almost 6 weeks ago. She still has a small probing area in the left metatarsal head region on the lateral aspect of her foot. We applied her last Dermagraft today. Since the last time she is here she has what appears to a been a blood blister on the plantar aspect of left foot although I don't see this is threatening. There is also a thick raised tissue on the right mid metatarsal head region. This was not there I don't think the last time she was here 3 weeks ago. 12/12/17; the patient continues to have a small programming area in the left metatarsal head region on the lateral aspect of her foot which was the initial large surgical wound. I applied her last  Apligraf last week. I'm going to use Endoform starting today Unfortunately she has an excoriated area in the left mid foot and the right mid foot. The left midfoot looks like a blistered area this was not opened last week it certainly is open today. Using silver alginate on these areas. She promises me she is offloading this. 12/19/17; the small probing area in the left metatarsal head eyes think is shallower. In general her original wound looks better. We've been using Endoform. The area inferiorly that I think was trauma last week still requires debridement a lot of nonviable surface which I removed. She still has an open open area distally in her foot Similarly on the right foot there is tightly adherent surface debris which I removed. Still areas that don't look completely epithelialized. This is a small open area. We used silver alginate on these areas 12/26/2017; the patient did not have the supplies we ordered from last week including the Endoform. The original large wound on the left lateral foot looks healthy. She still has the undermining area that is largely unchanged from last week. She has the same heavily callused raised edged wounds on the right mid and left midfoot. Both of these requiring debridement. We have been using silver alginate on these areas 01/02/2018; there is still supply issues. We are going to try to use  Prisma but I am not sure she actually got it from what she is saying. She has a new open area on the lateral aspect of the left fourth toe [previous fifth ray amputation]. Still the one tunneling area over the fourth metatarsal head. The area is in the midfoot bilaterally still have thick callus around them. She is concerned about a raised swelling on the lateral aspect of the foot. However she is completely insensate 01/10/2018; we are using Prisma to the wounds on her bilateral feet. Surprisingly the tunneling area over the left fourth metatarsal head that was part  of her original surgery has closed down. She has a small open area remaining on the incision line. 2 open areas in the midfoot. 02/10/2018; the patient arrives back in clinic after a month hiatus. She was traveling to visit family in [place]. Is fairly clear she was not offloading the areas on her feet. The original wound over the left lateral foot at the level of metatarsal heads is reopened and probes medially by about a centimeter or 2. She notes that a week ago she had purulent drainage come out of an area on the left midfoot. Paradoxically the worst area is actually on the right foot is extensive with purulent drainage. We will use silver alginate today 02/17/2018; the patient has 3 wounds one over the left lateral foot. She still has a small area over the metatarsal heads which is the remnant of her original surgical wound. This has medial probing depth of roughly 1.4 cm somewhat better than last week. The area on the right foot is larger. We have been using silver alginate to all areas. The area on the right foot and left foot that we cultured last week showed both Klebsiella and Proteus. Both of these are quinolone sensitive. The patient put her's self on Bactrim and Flagyl that she had left hanging around from prior antibiotic usages. She was apparently on this last week when she arrived. I did not realize this. Unfortunately the Bactrim will not cover either 1 of these organisms. We will send in Cipro 500 twice daily for a week 03/04/2018; the patient has 2 wounds on the left foot one is the original wound which was a surgical wound for a deep DFU. At one point this had exposed bone. She still has an area over the fourth metatarsal head that probes about 1.4 cm although I think this is better than last week. I been using silver nitrate to try and promote tissue adherence and been using silver alginate here. She also has an area in the left midfoot. This has some depth but a small linear  wound. Still requiring debridement. On the right midfoot is a circular wound. A lot of thick callus around this area. We have been using silver alginate to all wound areas She is completed the ciprofloxacin I gave her 2 weeks ago. 03/11/2018; the patient continues to have 2 open areas on the left foot 1 of which was the original surgical wound for a deep DFU. Only a small probing area remains although this is not much different from last week we have been using silver alginate. The other area is on the midfoot this is smaller linear but still with some depth. We have been using silver alginate here as well On the right foot she has a small circular wound in the mid aspect. This is not much smaller than last time. We have been using silver alginate here as well 03/18/2018; she  has 3 wounds on the left foot the original surgical wound, a very superficial wound in the mid aspect and then finally the area in the mid plantar foot. She arrives in today with a very concerning area in the wound in the mid plantar foot which is her most proximal wound. There is undermining here of roughly 1-1/2 cm superiorly. Serosanguineous drainage. She tells me she had some pain on for over the weekend that shot up her foot into her thigh and she tells me that she had a nodule in the groin area. She has the single wound in the right foot. We are using endoform to both wound areas 03/24/2018; the patient arrives with the original surgical wound in the area on the left midfoot about the same as last week. There is a collection of fluid under the surface of the skin extending from the surgical wound towards the midfoot although it does not reach the midfoot wound. The area on the right foot is about the same. Cultures from last week of the left midfoot wound showed abundant Klebsiella abundant Enterococcus faecalis and moderate methicillin resistant staph I gave her Levaquin but this would have only covered the  Klebsiella. She will need linezolid 04/01/2018; she is taking linezolid but for the first few days only took 1 a day. I have advised her to finish this at twice daily dosing. In any case all of her wounds are a lot better especially on the left foot. The original surgical wound is closed. The area on the left midfoot considerably smaller. The area on the right foot also smaller. 04/08/2018; her original surgical wound/osteomyelitis on the left foot remains closed. She has area on the left foot that is in the midfoot area but she had some streaking towards this. This is not connected with her original wound at least not visually. Small wound on the right midfoot appears somewhat smaller. 04/15/18; both wounds looks better. Original wound is better left midfoot. Using silver alginate 1/21; patient states she uses saltwater soak in, stones or remove callus from around her wounds. She is also concerned about a blood blister she had on the left foot but it simply resolved on its own. We've been using silver alginate 1/28; the patient arrives today with the same streaking area from her metatarsals laterally [the site of her original surgical wound] down to the middle of her foot. There is some drainage in the subcutaneous area here. This concerns me that there is actually continued ongoing infection in the metatarsals probably the fourth and third. This fixates an MRI of the foot without contrast [chronic renal failure] The wound in the mid part of the foot is small but I wonder whether this area actually connects with the more distal foot. The area on the right midfoot is probably about the same. Callus thick skin around the small wound which I removed with a curette we have been using silver alginate on both wound areas 2/4; culture I did of the draining site on the left foot last time grew methicillin sensitive staph aureus. MRI of the left foot showed interval resolution of the findings surrounding  the third metatarsal joint on the prior study consistent with treated osteomyelitis. Chronic soft tissue ulceration in the plantar and lateral aspect of the forefoot without residual focal fluid collection. No evidence of recurrent osteomyelitis. Noted to have the previous amputation of the distal first phalanx and fifth ray MRI of the right foot showed no evidence of osteomyelitis I  am going to treat the patient with a prolonged course of antibiotics directed against MSSA in the left foot 2/11; patient continues on cephalexin. She tells me she had nausea and vomiting over the weekend and missed 2 days. In general her foot looks much the same. She has a small open area just below the left fourth metatarsal head. A linear area in the left midfoot. Some discoloration extending from the inferior part of this into the left lateral foot although this appears to be superficial. She has a small area on the right midfoot which generally looks smaller after debridement 2/18; the patient is completing his cephalexin and has another 2 days. She continues to have open areas on the left and right foot. 2/25; she is now off antibiotics. The area on the left foot at the site of her original surgical wound has closed yet again. She still has open areas in the mid part of her foot however these appear smaller. The area on the right mid foot looks about the same. We have been using silver alginate She tells me she had a serious hypoglycemic spell at home. She had to have EMS called and get IV dextrose 3/3; disappointing on the left lateral foot large area of necrotic tissue surrounding the linear area. This appears to track up towards the same original surgical wound. Required extensive debridement. The area on the right plantar foot is not a lot better also using silver 3/12; the culture I did last time showed abundant enterococcus. I have prescribed Augmentin, should cover any unrecognized anaerobes as well. In  addition there were a few MRSA and Serratia that would not be well covered although I did not want to give her multiple antibiotics. She comes in today with a new wound in the right midfoot this is not connected with the original wound over her MTP a lot of thick callus tissue around both wounds but once again she said she is not walking on these areas 3/17-Patient comes in for follow-up on the bilateral plantar wounds, the right midfoot and the left plantar wound. Both these are heavily callused surrounding the wounds. We are continuing to use silver alginate, she is compliant with offloading and states she uses a wheelchair fairly often at home 3/24; both wound areas have thick callus. However things actually look quite a bit better here for the majority of her left foot and the right foot. 3/31; patient continues to have thick callused somewhat irritated looking tissue around the wounds which individually are fairly superficial. There is no evidence of surrounding infection. We have been using silver alginate however I change that to [facility] today 4/17; patient returns to clinic after having a scare with Covid she tested negative in her primary doctor's office. She has been using Hydrofera Blue. She does not have an open area on the right foot. On the left foot she has a small open area with the mid area not completely viable. She showed me pictures of what looks like a hemorrhagic blister from several days ago but that seems to have healed over this was on the lateral left foot 4/21; patient comes in to clinic with both her wounds on her feet closed. However over the weekend she started having pain in her right foot and leg up into the thigh. She felt as though she was running a low-grade fever but did not take her temperature. She took a doxycycline that she had leftover and yesterday a single Septra and metronidazole.  She thinks things feel somewhat better. 4/28; duplex ultrasound I  ordered last week was negative for DVT or superficial thrombophlebitis. She is completed the doxycycline I gave her. States she is still having a lot of pain in the right calf and right ankle which is no better than last week. She cannot sleep. She also states she has a temperature of up to 101, coughing and complaining of visual loss in her bilateral eyes. Apparently she was tested for Covid 2 weeks ago at [facility] and that was negative. Readmission: 09/03/18 patient presents back for reevaluation after having been evaluated at the end of April regarding erythema and swelling of her right lower extremity. Subsequently she ended up going to the hospital on 07/29/18 and was admitted not to be discharged until 08/08/18. Unfortunately it was noted during the time that she was in the hospital that she did have methicillin-resistant Staphylococcus aureus as the infection noted at the site. It was also determined that she did have osteomyelitis which appears to be fairly significant. She was treated with vancomycin and in fact is still on IV vancomycin at dialysis currently. This is actually slated to continue until 09/12/18 at least which will be the completion of the six weeks of therapy. Nonetheless based on what I'm seeing at this point I'm not sure she will be anywhere near ready to discontinue antibiotics at that time. Since she was released from the hospital she was seen by [name] who is her podiatrist on 08/27/18. His note specifically states that he is recommended that the patient needs of one knee amputation on the right as she has a life-threatening situation that can lead quickly to sepsis. The patient advised she would like to try to save her leg to which [name] apparently told her that this was against all medical advice. She also want to discontinue the Wound VAC which had been initiated due to the fact that she wasn't pleased with how the wound was looking and subsequently she wanted to  pursue applying Medihoney at that time. He stated that he did not believe that the right lower extremity was salvageable and that the patient understood but would still like to attempt hyperbaric option therapy if it could be of any benefit. She was therefore referred back to [place] for further evaluation. He plans to see her back next week. Upon inspection today patient has a significant amount purulent drainage noted from the wound at this point. The bone in the distal portion of her foot also appears to be extremely necrotic and spongy. When I push down on the bone it bubbles and seeps purulent drainage from deeper in the end of the foot. I do not think that this is likely going to heal very well at all and less aggressive surgical debridement were undertaken more than what I believe we can likely do here in our office. 09/12/2018; I have not seen this patient since the most recent hospitalization although she was in our clinic last week. I have reviewed some of her records from a complex hospitalization. 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 There is situation was complicated by MRSA bacteremia and acute on chronic renal failure now on dialysis. She is receiving vancomycin at dialysis. We started her on Dakin's wet-to- dry last week she is changing this daily. There is still purulent drainage coming out of her foot. Although she is apparently "agreeable" to a below-knee amputation which is been  suggested by multiple clinicians she wants this to be done in [place]. She apparently has a telehealth visit with that provider sometime in late [place] 6/24. I have told her I think this is probably too long. Nevertheless I could not convince her to allow a local doctor to perform BKA. 09/19/2018; the patient has a large necrotic area on the right anterior foot. She has had previous transmetatarsal amputations. Culture I did last week showed MRSA nothing  else she is on vancomycin at dialysis. She has continued leaking purulent drainage out of the distal part of the large circular wound on the right anterior foot. She apparently went to see Dr. 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 Somehow that translated into her being referred to plastic surgery for debridement of the area. I gather she basically refused amputation although I do not have a copy of Dr. Berenice Primas notes. The patient really wants to have a trial of hyperbaric oxygen. I agreed with initial assessment in this clinic that this was probably too far along to benefit however if she is going to have plastic surgery I think she would benefit from ancillary hyperbaric oxygen. The issue here is that the patient has benefited as maximally as any patient I have ever seen from hyperbaric oxygen therapy. Most recently she had exposed bone on the lateral part of her left foot after a surgical procedure and that actually has closed. She has eschared areas in both heels but no open area. She is remained systemically well. I am not optimistic that anything can be done about this but the patient is very clear that she wants an attempt. The attempt would include a wound VAC further debridements and hyperbaric oxygen along with IV antibiotics. 6/26; I put her in for a trial of hyperbaric oxygen only because of the dramatic response she has had with wounds on her left midfoot earlier this year which was a surgical wound that went straight to her bone over the metatarsal heads and also remotely the left third toe. We will see if we can get this through our review process and insurance. She arrives in clinic with again purulent material pouring out of necrotic bone on the top of the foot distally. There is also some concerning erythema on the front of the leg that we marked. It is bit difficult to tell how tender this is because of neuropathy. I note from infectious disease  that she had her vancomycin extended. 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 She had the wound VAC on for part of the week. The rest of the time she is putting various things on this including Medihoney, "ionized water" silver sorb gel etc. 7/7; follow-up along with HBO. She is still on vancomycin at dialysis. She has a large open area on the dorsal right foot and a small dark eschar area on her heel. There is a lot less erythema in the area and a lot less tenderness. From an infection point of view I think this is better. She still has a lot of necrosis in the remaining right forefoot [previous TMA] we are still using the wound VAC in this area 7/16; follow-up along with HBO. I put her on linezolid after she finished her vancomycin. We started this last Friday I gave her 2 weeks worth. I had the expectation that she would be operatively debrided by Dr. Marla Roe but that still has not happened yet. Patient phoned the office this  week. She arrives for review today after HBO. The distal part of this wound is completely necrotic. Nonviable pieces of tendon bone was still purulent drainage. Also concerning that she has black eschar over the heel that is expanding. I think this may be indicative of infection in this area as well. She has less erythema and warmth in the ankle and calf but still an abnormal exam 7/21 follow-up along with HBO. I will renew her linezolid after checking a CBC with differential monitoring her blood counts especially her platelets. She was supposed to have surgery yesterday but if I am reading things correctly this was canceled after her blood sugar was found to be over 500. I thought Dr. Marla Roe who called me said that they were sending her to the ER but the patient states that was not the case. 7/28. Follow-up along with HBO. She is on linezolid I still do not have any lab work from dialysis even though I called last week. The patient  is concerned about an area on her left lateral foot about the level of the base of her fifth metatarsal. I did not really see anything that ominous here however this patient is in [place] ability to point out problems that she is sensing and she has been accurate in the past Finally she received a call from Dr. Marla Roe who is referring her to another orthopedic surgeon stating that she is too booked up to take her to the operating room now. Was still using a wound VAC on the foot 8/3 -Follow-up after HBO, she is got another week of linezolid, she is to call ID for an appointment, x-rays of both feet were reviewed, the left foot x-ray with third MTP joint osteo- Right foot x-ray widespread osteo-in the right midfoot Right ankle x-ray does not show any active evidence of infection 8/11-Patient is seen after HBO, the wounds on the right foot appear to be about the same, the heel wound had some necrotic base over tendon that was debrided with a curette 8/21; patient is seen after HBO. The patient's wound on her dorsal foot actually looks reasonably good and there is substantial amount of epithelialization however the open area distally still has a lot of necrotic debris partially bone. I cannot really get a good sense of just how deep this probes under the foot. She has been pressuring me this week to order medical maggots through a company in [place] for her. The problem I have is there is not a defined wound area here. On the positive side there is no purulence. She has been to see infectious disease she is still on Septra DS although I have not had a chance to review their notes 8/28; patient is seen in conjunction with HBO. The wounds on her foot continued to improve including the right dorsal foot substantially the, the distal part of this wound and the area on the right heel. We have been using a wound VAC over this chronically. She is still on trimethoprim as directed by infectious  disease 9/4; patient is seen in conjunction with HBO. Right dorsal foot wound substantially anteriorly is better however she continues to have a deep wound in the distal part of this that is not responding. We have been using silver collagen under border foam Area on the right plantar medial heel seems better. We have been using Hydrofera Blue 12/12/18 on evaluation today patient appears to be doing about the same with regard to her wound based on prior  measurements. She does have some necrotic tissue noted on the lateral aspect of the wound that is going require a little bit of sharp debridement today. This includes what appears to be potentially either severely necrotic bone or tendon. Nonetheless other than that she does not appear to have any severe infection which is good news 9/18; it is been 2 weeks since I saw this wound. She is tolerating HBO well. Continued dramatic improvement in the area on the right dorsal foot. She still has a small wound on the heel that we have been using Hydrofera Blue. She continues with a wound VAC 9/24; patient has to be seen emergently today with a swelling on her right lateral lower leg. She says that she told Dr. Evette Doffing about this and also myself on a couple of occasions but I really have no recollection of this. She is not systemically unwell and her wound really looked good the last time I saw this. She showed this to providers at dialysis and she was able to verify that she was started on cephalexin today for 5 doses at dialysis. She dialyzes on Tuesday Thursday and Saturday. 10/2; patient is seen in conjunction with HBO. The area that is draining on the right anterior medial tibia is more extensive. Copious amounts of serosanguineous drainage with some purulence. We are still using the wound VAC on the original wound then it is stable. Culture I did of the original IandD showed MRSA I contacted dialysis she is now on vancomycin with dialysis treatments.  I asked them to run a month 10/9; patient seen in conjunction with HBO. She had a new spontaneous open area just above the wound on the right medial tibia ankle. More swelling on the right medial tibia. Her wound on the foot looks about the same perhaps slightly better. There is no warmth spreading up her leg but no obvious erythema. her MRI of the foot and ankle and distal tib-fib is not booked for next Friday I discussed this with her in great detail over multiple days. it is likely she has spreading infection upper leg at least involving the distal 25% above the ankle. She knows that if I refer her to orthopedics for infectious disease they are going to recommend amputation and indeed I am not against this myself. We had a good trial at trying to heal the foot which is what she wanted along with antibiotics debridement and HBO however she clearly has spreading infection [probably staph aureus/MRSA]. Nevertheless she once again tells me she wants to wait the left of the MRI. She still makes comments about having her amputation done in [place]. 10/19; arrives today with significant swelling on the lateral right leg. Last culture I did showed Klebsiella. Multidrug- resistant. Cipro was intermediate sensitivity and that is what I have her on pending her MRI which apparently is going to be done on Thursday this week although this seems to be moving back and forth. She is not systemically unwell. We are using silver alginate on her major wound area on the right medial foot and the draining areas on the right lateral lower leg 10/26; MRI showed extensive abscess in the anterior compartment of the right leg also widespread osteomyelitis involving osseous structures of the midfoot and portions of the hindfoot. Also suspicion for osteomyelitis anterior aspect of the distal medial malleolus. Culture I did of the purulence once again showed a multidrug-resistant Klebsiella. I have been in contact with  nephrology late last week and she has  been started on cefepime at dialysis to replace the vancomycin We sent a copy of her MRI report to Dr. Geroge Baseman in [place] who is an orthopedic surgeon. The patient takes great stock in his opinion on this. She says she will go to [place] to have her leg amputated if Dr. Geroge Baseman does not feel there is any salvage options. 11/2; she still is not talk to her orthopedic surgeon in [place]. Apparently he will call her at [phone] this afternoon. The quality of this is she has not allowed me to refer her anywhere. She has been told over and over that she needs this amputated but has not agreed to be referred. She tells me her blood sugar was 600 last night but she has not been febrile. 11/9; she never did got a call from the orthopedic surgeon in [place] therefore that is off the radar. We have arranged to get her see orthopedic surgery at [facility]. She still has a lot of draining purulence coming out of the new abscess in her right leg although that probably came from the osteomyelitis in her right foot and heel. Meanwhile the original wound on the right foot looks very healthy. Continued improvement. The issue is that the last MRI showed osteomyelitis in her right foot extensively she now has an abscess in the right anterior lower leg. There is nobody in [place] who will offer this woman anything but an amputation and to be honest that is probably what she needs. I think she still wants to talk about limb salvage although at this point I just do not see that. She has completed her vancomycin at dialysis which was for the original staph aureus she is still on cefepime for the more recent Klebsiella. She has had a long course of both of these antibiotics which should have benefited the osteomyelitis on the right foot as well as the abscess. 11/16; apparently [place] elective surgery is shut down because of COVID-19 pandemic. I have reached out  to some contacts at [facility] to see if we can get her an orthopedic appointment there. I am concerned about continually leaving this but for the moment everything is static. In fact her original large wound on this foot is closing down. It is the abscess on the right anterior leg that continues to drain purulent serosanguineous material. She is not currently on any antibiotics however she had a prolonged course of vancomycin [1 month] as well as cefepime for a month 02/24/2019 on evaluation today patient appears to be doing better than the last time I saw her. This is not a patient that I typically see. With that being said I am covering for Dr. Dellia Nims this week and again compared to when I last saw her overall the wounds in particular seem to be doing significantly better which is good news. With that being said the patient tells me several disconcerting things. She has not been able to get in to see anyone for potential debridement in regard to her leg wounds although she tells me that she does not think it is necessary any longer because she is taking care of that herself. She noticed a string coming out of the lower wound on her leg over the last week. The patient states that she subsequently decided that we must of pack something in there and started pulling the string out and as it kept coming and coming she realized this was likely her tendon. With that being said she continued to remove as much  of this as she could. She then I subsequently proceeded to using tubes of antibiotic ointment which she will stick down into the wound and then scored as much as she can until she sees it coming out of the other wound opening. She states that in doing this she is actually made things better and there is less redness and irritation. With regard to her foot wound she does have some necrotic tendon and tissue noted in one small corner but again the actual wound itself seems to be doing better with good  granulation in general compared to my last evaluation. 12/7; continued improvement in the wound on the substantial part of the right medial foot. Still a necrotic area inferiorly that required debridement but the rest of this looks very healthy and is contracting. She has 2 wounds on the right lateral leg which were her original drainage sites from her abscess but all of this looks a lot better as well. She has been using silver alginate after putting antibiotic biotic ointment in one wound and watching it come out the other. I have talked to her in some detail today. I had given her names of orthopedic surgeons at [facility] for second opinion on what to do about the right leg. I do not think the patient never called them. She has not been able to get a hold of the orthopedic surgeon in [place] that she had put a lot of faith in as being somebody would give her an opinion that she would trust. I talked to her today and said even if I could get her in to another orthopedic surgeon about the leg which she accept an amputation and she said she would not therefore I am not going to press this issue for the moment 12/14; continued improvement in his substantial wound on the right medial foot. There is still a necrotic area inferiorly with tightly adherent necrotic debris which I have been working on debriding each time she is here. She does not have an orthopedic appointment. Since last time she was here I looked over her cultures which were essentially MRSA on the foot wound and gram- negative rods in the abscess on the anterior leg. 12/21; continued improvement in the area on the right medial foot. She is not up on this much and that is probably a good thing since I do not know it could support continuous ambulation. She has a small area on the right lateral leg which were remanence of the IandD's I did because of the abscess. I think she should probably have prophylactic antibiotics I am going  to have to look this over to see if we can make an intelligent decision here. In the meantime her major wound is come down nicely. Necrotic area inferiorly is still there but looks a lot better 04/06/2019; she has had some improvement in the overall surface area on the right medial foot somewhat narrowedr both but somewhat longer. The areas on the right lateral leg which were initial IandD sites are superficial. Nothing is present on the right heel. We are using silver alginate to the wound areas 1/18; right medial foot somewhat smaller. Still a deep probing area in the most distal recess of the wound. She has nothing open on the right leg. She has a new wound on the plantar aspect of her left fourth toe which may have come from just pulling skin. The patient using Medihoney on the wound on her foot under silver alginate. I cannot  discourage her from this 2/1; 2-week follow-up using silver alginate on the right foot and her left fourth toe. The area on the right dorsal foot is contracted although there is still the deep area in the most distal part of the wound but still has some probing depth. No overt infection Electronic Signature(s) Signed: 05/04/2019 6:41:12 PM By: Linton Ham MD Entered By: Linton Ham on 05/04/2019 15:15:35 -------------------------------------------------------------------------------- Physical Exam Details Patient Name: Date of Service: [name] 05/04/2019 1:00 PM Medical Record Number:[id number] Patient Account Number: [id number] Date of Birth/Sex: Treating RN: January 25, 1971 (49 y.o. F) Primary Care Provider: [name] Other Clinician: Referring Provider: Treating Provider/Extender:Shanaia Sievers, [name], [name] in Treatment: 80 Constitutional Patient is hypertensive.. Pulse regular and within target range for patient.Marland Kitchen Respirations regular, non-labored and within target range.. Temperature is normal and within the target range for the  patient.Marland Kitchen Appears in no distress. Notes Wound exam; generally continued improvement in the surface area of the wound on the right dorsal foot. Distally there is still a small area with depth I used chemical cauterization on this to try and pull the wound together. Left fourth toe is epithelialized in the middle of 2 small open areas. Electronic Signature(s) Signed: 05/04/2019 6:41:12 PM By: Linton Ham MD Entered By: Linton Ham on 05/04/2019 15:17:56 -------------------------------------------------------------------------------- Physician Orders Details Patient Name: Date of Service: [name] 05/04/2019 1:00 PM Medical Record Number:[id number] Patient Account Number: [id number] Date of Birth/Sex: Treating RN: April 13, 1970 (49 y.o. Nancy Fetter Primary Care Provider: [name] Other Clinician: Referring Provider: Treating Provider/Extender:Jeryl Umholtz, [name], [name] in Treatment: 59 Verbal / Phone Orders: No Diagnosis Coding ICD-10 Coding Code Description M86.671 Other chronic osteomyelitis, right ankle and foot L97.514 Non-pressure chronic ulcer of other part of right foot with necrosis of bone E10.621 Type 1 diabetes mellitus with foot ulcer L97.521 Non-pressure chronic ulcer of other part of left foot limited to breakdown of skin Follow-up Appointments Return Appointment in 2 weeks. Dressing Change Frequency Wound #43 Right,Medial Foot Change dressing three times week. Wound #48 Left Toe Fourth Change dressing three times week. Wound Cleansing Clean wound with Wound Cleanser - Anasept to all wounds Primary Wound Dressing Wound #43 Right,Medial Foot Calcium Alginate with Silver - lightly pack Wound #48 Left Toe Fourth Calcium Alginate with Silver Secondary Dressing Wound #43 Right,Medial Foot Kerlix/Rolled [name] ABD pad Wound #48 Left Toe Fourth Kerlix/Rolled [name] Dry [name] Edema Control Elevate legs to the level of the heart or  above for 30 minutes daily and/or when sitting, a frequency of: - throughout the day [facility] skilled nursing for wound care. - Interim Electronic Signature(s) Signed: 05/04/2019 6:39:44 PM By: [name] RN, BSN Signed: 05/04/2019 6:41:12 PM By: Linton Ham MD Entered By: [name] on 05/04/2019 14:10:48 -------------------------------------------------------------------------------- Problem List Details Patient Name: Date of Service: [name] 05/04/2019 1:00 PM Medical Record Number:[id number] Patient Account Number: [id number] Date of Birth/Sex: Treating RN: 01-07-1971 (49 y.o. Nancy Fetter Primary Care Provider: [name] Other Clinician: Referring Provider: Treating Provider/Extender:Shawanda Sievert, [name], [name] in Treatment: 36 Active Problems ICD-10 Evaluated Encounter Code Description Active Date Today Diagnosis M86.671 Other chronic osteomyelitis, right ankle and foot 09/03/2018 No Yes L97.514 Non-pressure chronic ulcer of other part of right foot 09/03/2018 No Yes with necrosis of bone E10.621 Type 1 diabetes mellitus with foot ulcer 09/24/2018 No Yes L97.521 Non-pressure chronic ulcer of other part of left foot 04/20/2019 No Yes limited to breakdown of skin Inactive Problems ICD-10  Code Description Active Date Inactive Date L97.812 Non-pressure chronic ulcer of other part of right lower leg with 02/24/2019 02/24/2019 fat layer exposed Resolved Problems ICD-10 Code Description Active Date Resolved Date L02.415 Cutaneous abscess of right lower limb 12/25/2018 12/25/2018 Electronic Signature(s) Signed: 05/04/2019 6:41:12 PM By: Linton Ham MD Entered By: Linton Ham on 05/04/2019 15:14:17 -------------------------------------------------------------------------------- Progress Note Details Patient Name: Date of Service: [name] 05/04/2019 1:00 PM Medical Record Number:[id number] Patient Account  Number: [id number] Date of Birth/Sex: Treating RN: 1971-03-01 (49 y.o. F) Primary Care Provider: [name] Other Clinician: Referring Provider: Treating Provider/Extender:Ermon Sagan, [name], [name] in Treatment: 34 Subjective History of Present Illness (HPI) 49 year old diabetic who is known to have type 1 diabetes which is poorly controlled last hemoglobin A1c was 11%. She comes in with a ulcerated area on the left lateral foot which has been there for over 6 months. Was recently she has been treated by [name] of podiatry who saw her last on 05/28/2016. Review of his notes revealed that the patient had incision and drainage with placement of antibiotic beads to the left foot on 04/11/2016 for possible osteomyelitis of the cuboid bone. Over the last year she's had a history of amputation of the left fifth toe and a femoropopliteal popliteal bypass graft somewhere in April 2017. 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 His note [name] mentions that the patient has been referred to me for further wound care and possibly great candidate for hyperbaric oxygen therapy due to recurrent osteomyelitis. However we do not have any x-rays of biopsy reports confirming this. He has been on several antibiotics including Bactrim and most recently is on doxycycline for an MRSA. I understand, the patient was not a candidate for IV antibiotics as she has had previous PICC lines which resulted in blood clots in both arms. There was a x-ray report dated 04/04/2016 on [name] notes which showed evidence of fifth ray resection left foot with osteolytic changes noted to the fourth metatarsal and cuboid bone on the left. 06/13/2016 -- had a left foot x-ray which showed no acute fracture or dislocation and no definite radiographic evidence of osteomyelitis. Advanced osteopenia was seen. 06/20/2016 -- she has noticed a new wound on the right plantar foot in the region where she  had a callus before. 06/27/16- the patient did have her x-ray of the right foot which showed no findings to suggest osteomyelitis. She saw her endocrinologist, [name], yesterday. Her A1c in January was 11. He also indicates mismanagement and noncompliance regarding her diabetes. She is currently on Bactrim for a lip infection. She is complaining of nausea, vomiting and diarrhea. She is unable to articulate the exact orders or dosing of the Bactrim; it is unclear when she will complete this. 07/04/2016 -- results from [facility] of ABIs with ankle waveforms were noted from 02/14/2016. The examination done on 06/27/2015 showed noncompressible ABIs with the right being 1.45 and the left being 1.33. The present examination showed a right ABI of 1.19 on the left of 1.33. The conclusion was that right normal ABI in the lower extremity at rest however compared to previous study which was noncompressible ABI may be falsely elevated side suggesting medial calcification. The left ABI suggested medial calcification. 08/01/2016 -- the patient had more redness and pain on her right foot and did not get to come to see as noted she see her PCP or go to the ER and decided to take some leftover metronidazole which  she had at home. As usual, the patient does report she feels and is rather noncompliant. 08/08/2016 -- -- x-ray of the right foot -- FINDINGS:Transmetatarsal amputation is noted. No bony destruction is noted to suggest osteomyelitis. IMPRESSION: No evidence of osteomyelitis. Postsurgical changes are seen. MRI would be more sensitive for possible bony changes. Culture has grown Serratia Marcescens -- sensitive to Bactrim, ciprofloxacin, ceftazidime she was seen by [name] on 08/06/2016. He did not find any exposed bone, muscle, tendon, ligament or joint. There was no malodor and he did a excisional debridement in the office. ============ Old notes: 49 year old patient who is known to the  wound clinic for a while had been away from the wound clinic since 09/01/2014. Over the last several months she has been admitted to various hospitals including [place] at [facility]. She was treated for a right metatarsal osteomyelitis with a transmetatarsal amputation and this was done about 2 months ago. He has a small ulcerated area on the right heel and she continues to have an ulcerated area on the left plantar aspect of the foot. The patient was recently admitted to the [facility] hospital group between 7/12 and 10/18/2014. she was given 3 weeks of IV vancomycin and was to follow-up with her surgeons at [facility] and also took oral vancomycin for C. difficile colitis. 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 MRI of the left foot done on 10/14/2014 showed no abscess or osteomyelitis. 04/27/15; this is a patient we know from previous stays in the wound care center. She is a type I diabetic I am not sure of her control currently. Since the last time I saw her she is had a right transmetatarsal amputation and has no wounds on her right foot and has no open wounds. She is been followed at the wound care center at [facility] in [place]. She comes today with the desire to undergo hyperbaric treatment locally. Apparently one of her wound care providers in [place] has suggested hyperbarics. This is in response to an MRI from 04/18/15 that showed increased marrow signal and loss of the proximal fifth metatarsal cortex evidence of osteomyelitis with likely early osteomyelitis in the cuboid bone as well. She has a large wound over the base of the fifth metatarsal. She also has a eschar over her the tips of her  toes on 1,3 and 5. She does not have peripheral pulses and apparently is going for an angiogram tomorrow which seems reasonable. After this she is going to infectious disease at [facility]. They have been using Medihoney to the large wound on the lateral aspect of the left foot to. The patient has known Charcot deformity from diabetic neuropathy. She also has known diabetic PAD. Surprisingly I can't see that she has had any recent antibiotics, the patient states the last antibiotic she had was at the end of November for 10 days. I think this was in response to culture that showed group G strep although I'm not exactly sure where the culture was from. She is also had arterial studies on 03/29/15. This showed a right ABI of 1.4 that was noncompressible. Her left ABI was 0.73. 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 It was not felt that arterial inflow was adequate for healing of a  foot ulcer. Her Doppler waveforms looked monophasic ===== READMISSION 02/28/17; this is in an now 49 year old woman we've had at several different occasions in this clinic. 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 She has a remote ex-smoker. She was last seen in this clinic by Dr. Con Memos I think in May. More recently she is been followed by her podiatrist [name] an infectious disease Dr. Megan Salon. She has 2 open wounds the major one is over the right first metatarsal head she also has a wound on the left plantar foot. an MRI of the right foot on 01/01/17 showed a soft tissue ulcer along the plantar aspect of the first metatarsal base consistent with osteomyelitis of the first metatarsal stump. Dr. 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 According to the patient this is been open for slightly over a month. She has been on a combination of Cipro 500 twice a day, Zyvox 600 twice  a day and Flagyl 500 3 times a day for over a month now as directed by Dr. Megan Salon. cultures of the right foot earlier this year showed MRSA in January and Serratia in May. January also had a few viridans strep. Recent x-rays of both feet were done and [name] office and I don't have these reports. The patient has known PAD and has a history of aleft femoropopliteal bypass in April 2017. 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 Dwyane Dee. In spite of this the last hemoglobin A1c I can see is 10.1 on 01/01/2017. She is being referred by 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 She is been using Medihoney to this area and also an area on the left midfoot. She is using healing sandals bilaterally. ABIs in this clinic at the left posterior tibial was 1.1 noncompressible on the right READMISSION Non invasive vascular NOVANT 5/18 Aftercare following surgery of the circulatory system Procedure Note - Interface, External Ris In - 08/13/2016 11:05 AM EDT Procedure: Examination consists of physiologic resting arterial pressures of the brachial and ankle arteries bilaterally with continuous wave Doppler waveform analysis. Previous: Previous exam performed on 02/14/16 demonstrated ABIs of Rt = 1.19 and Lt = 1.33. Right: ABI = non-compressible PT, 1.47 DP. S/P transmet amputation. Left: ABI = 1.52, 2nd digit pressure = 87 mmHg Conclusions: Right: ABI (>1.3) may be falsely elevated, suggesting medial calcification. Left: ABI (>1.3) may be falsely elevated, suggesting medial calcification The patient is a now 50 year old type I diabetic is had multiple issues her graded to chronic diabetic foot ulcers. She has had a previous right transmetatarsal amputation  fifth ray amputation. She had Charcot feet diabetic polyneuropathy. We had her in the clinic lastin November. At that point she had wounds on her bilateral feet.she had wanted to try hyperbarics however the healogics review process denied her because she hadn't followed up with her vascular surgeon for her left femoropopliteal bypass. The bypass was done by Dr. Raul Del at [facility]. We made her a follow-up with Dr. Raul Del however she did not keep the appointment and therefore she was not approved The patient shows me a small wound on her left fourth metatarsal head on her phone. She developed rapid discoloration  in the plantar aspect of the left foot and she was admitted to hospital from 2/2 through 05/10/17 with wet gangrene of the left foot osteomyelitis of the fourth metatarsal heads. She was admitted acutely ill with a temperature of 103. She was started on broad-spectrum vancomycin and cefepime. On 05/06/17 she was taken to the OR by [name] her podiatric surgeon for an incision and drainage irrigation of the left foot wound. Cultures from this surgery revealed group be strep and anaerobes. she was seen by Dr.Xu of orthopedic surgery and scheduled for a below-knee amputation which she u refused. Ultimately she was discharged on Levaquin and Flagyl for one month. MRI 05/05/17 done while she was in the hospital showed abscess adjacent to the fourth metatarsal head and neck small abscess around the fourth flexor tendon. Inflammatory phlegmon and gas in the soft tissues along the lateral aspect of the fourth phalanx. Findings worrisome for osteomyelitis involving the fourth proximal and middle phalanx and also the third and fourth metatarsals. Finally the patient had actually shortly before this followed up with Dr. Raul Del at no time on 04/29/17. He felt that her left femoropopliteal bypass was patent he felt that her left-sided toe pressures more than adequate for healing a wound on the left  foot. This was before her acute presentation. 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 05/28/17; she is started hyperbarics. The patient tells me that for some reason she was not actually on Levaquin but I think on ciprofloxacin. She was on Flagyl. She only started her Levaquin yesterday due to some difficulty with the pharmacy and perhaps her sister picking it up. She has an appointment with [name] tomorrow and with infectious disease early next week. She has no new complaints 06/06/17; the patient continues in hyperbarics. She saw [name] on 05/29/17 who is her podiatric surgeon. He is elected for a transmetatarsal amputation on 06/27/17. I'm not sure at what level he plans to do this amputation. The patient is unaware ooShe also saw Dr. Megan Salon of infectious disease who elected to continue her on current antibiotics I think this is ciprofloxacin and Flagyl. I'll need to clarify with her tomorrow if she actually has this. We're using silver alginate to the actual wound. Necrotic surface today with material under the flap of her foot. ooOriginal MRI showed abscesses as well as osteomyelitis of the proximal and middle fourth phalanx and the third and fourth metatarsal heads 06/11/17; patient continues in hyperbarics and continues on oral antibiotics. She is doing well. The wound looks better. The necrotic part of this under the flap in her superior foot also looks better. she is been to see [name]. I haven't had a chance to look at his note. Apparently he has put the transmetatarsal amputation on hold her request it is still planning to take her to the OR for debridement and product application ACEL. I'll see if I can find his note. I'll therefore leave product ordering/requests to [name] for now. I was going to look at Dermagraft 06/18/17-she is here in follow-up evaluation for bilateral foot wounds. She continues with hyperbaric therapy. She states she has been applying manuka honey to  the right plantar foot and alternate manuka honey and silver alginate to the left foot, despite our orders. We will continue with same treatment plan and she will follo up next week. 06/25/17; I have reviewed [name] last note from 3/11. She has operative debridement in 2 days' time. By review his note apparently they're  going to place there is skin over the majority of this wound which is a good choice. She has a small satellite area at the most proximal part of this wound on the left plantar foot. The area on the right plantar foot we've been using silver alginate and it is close to healing. 07/02/17; unfortunately the patient was not easily approved for [name] proposed surgery. I'm not completely certain what the issue is. She has been using silver alginate to the wound she has completed a first course of hyperbarics. She is still on Levaquin and Flagyl. I have really lost track of the time course here.I suspect she should have another week to 2 of antibiotics. I'll need to see if she is followed up with infectious disease Dr. Megan Salon 07/09/17; the patient is followed up with Dr. Megan Salon. 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 She continues on Levaquin and metronidazole continuing both of these for now I think she is been on fr about 6 weeks. She still has some drainage but no pain. No fever. Her had been plans for her to go to the OR for operative debridement with her podiatrist [name], I am not exactly sure where that is. I'll probably slip a note to [name] today. I note that she follows with Dr. Dwyane Dee of endocrinology. We have her recertified for hyperbaric oxygen. I have not heard about Dermagraft however I'll see if [name] is planning a skin substitute as well 07/16/17; the patient tells me she is just about out of [place]. I'll need to check Dr. Hale Bogus last notes on this. She states she has plenty of Flagyl however. She comes in  today complaining of pain in the right lateral foot which she said lasted for about a day. The wound on the right foot is actually much more medially. She also tells me that the [facility] cost a lot of pain in the left foot wound and she turned back to silver alginate. Finally Dermagraft has a $169 per application co-pay. She cannot afford this 07/23/17; patient arrives today with the wound not much smaller. There is not much new to add. She has not heard from [name] all try to put in a call to them today. She was asking about Dermagraft again and she has an over $678 per application co-pay she states that she would be willing to try to do a payment plan. I been tried to avoid this. We've been using silver alginate, I'll change to [facility] 07/30/17-She is here in follow-up evaluation for left foot ulcer. She continues hyperbaric medicine. The left foot ulcer is stable we will continue with same treatment plan 08/06/17; she is here for evaluation of her left foot ulcer. Currently being treated for hyperbarics or underlying osteomyelitis. She is completed antibiotics. The left foot ulcer is better smaller with healthier looking granulation. For various reasons I am not really clear on we never got her back to the OR with [name]. He did not respond to my secure text message. Nevertheless I think that surgery on this point is not necessary nor am I completely clear that a skin substitute is necessary The patient is complaining about pain on the outside of her right foot. 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 There is no erythema. She also states the foot is warm versus her other part of her upper leg and this is largely true. It is not totally  clear to me what's causing this. She thinks it's different from her usual neuropathy pain 08/13/17; she arrives in clinic today with a small wound which is superficial on her right first metatarsal head. 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 She tells [place] she was up on her feet over the Mother's Day celebration. ooThe large wound is on the left foot. Continues with hyperbarics for underlying osteomyelitis. We're using Hydrofera Blue. She asked me today about where we were with Dermagraft. I had actually excluded this because of the co- pay however she wants to assume this therefore I'll recheck the co-pay an order for next week. 08/20/17; the patient agreed to accept the co-pay of the first Apligraf which we applied today. She is disappointed she is finishing hyperbarics will run this through the insurance on the extent of the foot infection and the extent of the wound that she had however she is already had 60 dive's. Dermagraft No. 1 08/27/17; Dermagraft No. 2. She is not eligible for any more hyperbaric treatments this month. She reports a fair amount of drainage and she actually changed to the external dressings without disturbing the direct contact layer 09/03/17; the patient arrived in clinic today with the wound superficially looking quite healthy. Nice vibrant red tissue with some advancing epithelialization although not as much adherence of the flap as I might like. However she noted on her own fourth toe some bogginess and she brought that to our attention. Indeed this was boggy feeling like a possibility of subcutaneous fluid. 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 She is not been unwell. We've been using Dermagraft 09/10/17; the culture that I did not last week was MRSA. She saw Dr. Megan Salon this morning who is going to start her on vancomycin. I had sent him a secure a text message yesterday. I also spoke with her podiatric surgeon [name] about surgery on this foot the options for conserving a functional foot etc. Promised me he would see her and will make back consultation today. Paradoxically her actual  wound on the plantar aspect of her left foot looks really quite good. I had given her 5 days worth of Baxdella to cover her for MRSA. Her MRI came back showing osteomyelitis within the third metatarsal shaft and head and base of the third and fourth proximal phalanx. She had extensive inflammatory changes throughout the soft tissue of the lateral forefoot. With an ill-defined fluid around the fourth metatarsal extending into the plantar and dorsal soft tissues 09/19/17; the patient is actually on oral Septra and Flagyl. She apparently refused IV vancomycin. She also saw [name] at my request who is planning her for a left BKA sometime in mid July. MRI showed osteomyelitis within the third metatarsal shaft and head and the basis of the third and fourth proximal phalanx. I believe there was felt to be possible septic arthritis involving the third MTP. 09/26/17; the patient went back to Dr. Megan Salon at my suggestion and is now receiving IV daptomycin. Her wound continues to look quite good making the decision to proceed with a transmetatarsal amputation although more difficult for the patient. I believe in my extensive discussions with her she has a good sense of the pros and cons of this. I don't NV the tuft decision she has to make. She has an appointment with [name] I believe in mid July and I  previously spoken to him about this issue Has we had used 3 previous Dermagraft. Given the condition of the wound surface I went ahead and added the fourth one today area and I did this not fully realizing that she'll be traveling to [place] next week. I'm hopeful she can come back in 2 weeks 10/21/17; Her same Dermagraft on for about 3-1/2 weeks. In spite of this the wound arrives looking quite healthy. There is been a lot of healing dimensions are smaller. Looking at the square shaped wound she has now there is some undermining and some depth medially under the undermining although I cannot palpate any  bone. No surrounding infection is obvious. She has difficult questions about how to look at this going forward vis--vis amputations versus continued medical therapy. To be truthful the wound is looks so healthy and it is continued to contract. Hard to justify foot surgery at this point although I still told her that I think it might come to that if we are not able to eradicate the underlying MRSA. She is still highly at risk and she understands this 11/06/17 on evaluation today patient appears to be doing better in regard to her foot ulcer. She's been tolerating the dressing changes without complication. Currently she is here for her Dermagraft #6. Her wound continues to make excellent progress at this point. She does not appear to have any evidence of infection which is good news. 11/13/17 on evaluation today patient appears to be doing excellent at this time. She is here for repeat Dermagraft application. This is #7. Overall her wound seems to be making great progress. 12/05/17; the patient arrives with the wound in much better condition than when I last saw this almost 6 weeks ago. She still has a small probing area in the left metatarsal head region on the lateral aspect of her foot. We applied her last Dermagraft today. ooSince the last time she is here she has what appears to a been a blood blister on the plantar aspect of left foot although I don't see this is threatening. There is also a thick raised tissue on the right mid metatarsal head region. This was not there I don't think the last time she was here 3 weeks ago. 12/12/17; the patient continues to have a small programming area in the left metatarsal head region on the lateral aspect of her foot which was the initial large surgical wound. I applied her last Apligraf last week. I'm going to use Endoform starting today ooUnfortunately she has an excoriated area in the left mid foot and the right mid foot. The left midfoot looks like  a blistered area this was not opened last week it certainly is open today. Using silver alginate on these areas. She promises me she is offloading this. 12/19/17; the small probing area in the left metatarsal head eyes think is shallower. In general her original wound looks better. We've been using Endoform. The area inferiorly that I think was trauma last week still requires debridement a lot of nonviable surface which I removed. She still has an open open area distally in her foot ooSimilarly on the right foot there is tightly adherent surface debris which I removed. Still areas that don't look completely epithelialized. This is a small open area. We used silver alginate on these areas 12/26/2017; the patient did not have the supplies we ordered from last week including the Endoform. The original large wound on the left lateral foot looks healthy. She still has  the undermining area that is largely unchanged from last week. She has the same heavily callused raised edged wounds on the right mid and left midfoot. Both of these requiring debridement. We have been using silver alginate on these areas 01/02/2018; there is still supply issues. We are going to try to use Prisma but I am not sure she actually got it from what she is saying. She has a new open area on the lateral aspect of the left fourth toe [previous fifth ray amputation]. Still the one tunneling area over the fourth metatarsal head. The area is in the midfoot bilaterally still have thick callus around them. She is concerned about a raised swelling on the lateral aspect of the foot. However she is completely insensate 01/10/2018; we are using Prisma to the wounds on her bilateral feet. Surprisingly the tunneling area over the left fourth metatarsal head that was part of her original surgery has closed down. She has a small open area remaining on the incision line. 2 open areas in the midfoot. 02/10/2018; the patient arrives back in  clinic after a month hiatus. She was traveling to visit family in [place]. Is fairly clear she was not offloading the areas on her feet. The original wound over the left lateral foot at the level of metatarsal heads is reopened and probes medially by about a centimeter or 2. She notes that a week ago she had purulent drainage come out of an area on the left midfoot. Paradoxically the worst area is actually on the right foot is extensive with purulent drainage. We will use silver alginate today 02/17/2018; the patient has 3 wounds one over the left lateral foot. She still has a small area over the metatarsal heads which is the remnant of her original surgical wound. This has medial probing depth of roughly 1.4 cm somewhat better than last week. The area on the right foot is larger. We have been using silver alginate to all areas. The area on the right foot and left foot that we cultured last week showed both Klebsiella and Proteus. Both of these are quinolone sensitive. The patient put her's self on Bactrim and Flagyl that she had left hanging around from prior antibiotic usages. She was apparently on this last week when she arrived. I did not realize this. Unfortunately the Bactrim will not cover either 1 of these organisms. We will send in Cipro 500 twice daily for a week 03/04/2018; the patient has 2 wounds on the left foot one is the original wound which was a surgical wound for a deep DFU. At one point this had exposed bone. She still has an area over the fourth metatarsal head that probes about 1.4 cm although I think this is better than last week. I been using silver nitrate to try and promote tissue adherence and been using silver alginate here. ooShe also has an area in the left midfoot. This has some depth but a small linear wound. Still requiring debridement. ooOn the right midfoot is a circular wound. A lot of thick callus around this area. ooWe have been using silver alginate to all  wound areas ooShe is completed the ciprofloxacin I gave her 2 weeks ago. 03/11/2018; the patient continues to have 2 open areas on the left foot 1 of which was the original surgical wound for a deep DFU. Only a small probing area remains although this is not much different from last week we have been using silver alginate. The other area is on  the midfoot this is smaller linear but still with some depth. We have been using silver alginate here as well ooOn the right foot she has a small circular wound in the mid aspect. This is not much smaller than last time. We have been using silver alginate here as well 03/18/2018; she has 3 wounds on the left foot the original surgical wound, a very superficial wound in the mid aspect and then finally the area in the mid plantar foot. She arrives in today with a very concerning area in the wound in the mid plantar foot which is her most proximal wound. There is undermining here of roughly 1-1/2 cm superiorly. Serosanguineous drainage. She tells me she had some pain on for over the weekend that shot up her foot into her thigh and she tells me that she had a nodule in the groin area. ooShe has the single wound in the right foot. ooWe are using endoform to both wound areas 03/24/2018; the patient arrives with the original surgical wound in the area on the left midfoot about the same as last week. There is a collection of fluid under the surface of the skin extending from the surgical wound towards the midfoot although it does not reach the midfoot wound. The area on the right foot is about the same. Cultures from last week of the left midfoot wound showed abundant Klebsiella abundant Enterococcus faecalis and moderate methicillin resistant staph I gave her Levaquin but this would have only covered the Klebsiella. She will need linezolid 04/01/2018; she is taking linezolid but for the first few days only took 1 a day. I have advised her to finish this  at twice daily dosing. In any case all of her wounds are a lot better especially on the left foot. The original surgical wound is closed. The area on the left midfoot considerably smaller. The area on the right foot also smaller. 04/08/2018; her original surgical wound/osteomyelitis on the left foot remains closed. She has area on the left foot that is in the midfoot area but she had some streaking towards this. This is not connected with her original wound at least not visually. ooSmall wound on the right midfoot appears somewhat smaller. 04/15/18; both wounds looks better. Original wound is better left midfoot. Using silver alginate 1/21; patient states she uses saltwater soak in, stones or remove callus from around her wounds. She is also concerned about a blood blister she had on the left foot but it simply resolved on its own. We've been using silver alginate 1/28; the patient arrives today with the same streaking area from her metatarsals laterally [the site of her original surgical wound] down to the middle of her foot. There is some drainage in the subcutaneous area here. This concerns me that there is actually continued ongoing infection in the metatarsals probably the fourth and third. This fixates an MRI of the foot without contrast [chronic renal failure] ooThe wound in the mid part of the foot is small but I wonder whether this area actually connects with the more distal foot. ooThe area on the right midfoot is probably about the same. Callus thick skin around the small wound which I removed with a curette we have been using silver alginate on both wound areas 2/4; culture I did of the draining site on the left foot last time grew methicillin sensitive staph aureus. MRI of the left foot showed interval resolution of the findings surrounding the third metatarsal joint on the prior study  consistent with treated osteomyelitis. Chronic soft tissue ulceration in the plantar and lateral  aspect of the forefoot without residual focal fluid collection. No evidence of recurrent osteomyelitis. 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 She tells me she had nausea and vomiting over the weekend and missed 2 days. In general her foot looks much the same. She has a small open area just below the left fourth metatarsal head. A linear area in the left midfoot. Some discoloration extending from the inferior part of this into the left lateral foot although this appears to be superficial. She has a small area on the right midfoot which generally looks smaller after debridement 2/18; the patient is completing his cephalexin and has another 2 days. She continues to have open areas on the left and right foot. 2/25; she is now off antibiotics. The area on the left foot at the site of her original surgical wound has closed yet again. She still has open areas in the mid part of her foot however these appear smaller. The area on the right mid foot looks about the same. We have been using silver alginate She tells me she had a serious hypoglycemic spell at home. She had to have EMS called and get IV dextrose 3/3; disappointing on the left lateral foot large area of necrotic tissue surrounding the linear area. This appears to track up towards the same original surgical wound. Required extensive debridement. The area on the right plantar foot is not a lot better also using silver 3/12; the culture I did last time showed abundant enterococcus. I have prescribed Augmentin, should cover any unrecognized anaerobes as well. In addition there were a few MRSA and Serratia that would not be well covered although I did not want to give her multiple antibiotics. She comes  in today with a new wound in the right midfoot this is not connected with the original wound over her MTP a lot of thick callus tissue around both wounds but once again she said she is not walking on these areas 3/17-Patient comes in for follow-up on the bilateral plantar wounds, the right midfoot and the left plantar wound. Both these are heavily callused surrounding the wounds. We are continuing to use silver alginate, she is compliant with offloading and states she uses a wheelchair fairly often at home 3/24; both wound areas have thick callus. However things actually look quite a bit better here for the majority of her left foot and the right foot. 3/31; patient continues to have thick callused somewhat irritated looking tissue around the wounds which individually are fairly superficial. There is no evidence of surrounding infection. We have been using silver alginate however I change that to [facility] today 4/17; patient returns to clinic after having a scare with Covid she tested negative in her primary doctor's office. She has been using Hydrofera Blue. She does not have an open area on the right foot. On the left foot she has a small open area with the mid area not completely viable. She showed me pictures of what looks like a hemorrhagic blister from several days ago but that seems to have healed over this was on the lateral left foot 4/21; patient comes in to clinic with both her wounds  on her feet closed. However over the weekend she started having pain in her right foot and leg up into the thigh. She felt as though she was running a low-grade fever but did not take her temperature. She took a doxycycline that she had leftover and yesterday a single Septra and metronidazole. She thinks things feel somewhat better. 4/28; duplex ultrasound I ordered last week was negative for DVT or superficial thrombophlebitis. She is completed the doxycycline I gave her. States she is still having  a lot of pain in the right calf and right ankle which is no better than last week. She cannot sleep. She also states she has a temperature of up to 101, coughing and complaining of visual loss in her bilateral eyes. Apparently she was tested for Covid 2 weeks ago at [facility] and that was negative. Readmission: 09/03/18 patient presents back for reevaluation after having been evaluated at the end of April regarding erythema and swelling of her right lower extremity. Subsequently she ended up going to the hospital on 07/29/18 and was admitted not to be discharged until 08/08/18. Unfortunately it was noted during the time that she was in the hospital that she did have methicillin-resistant Staphylococcus aureus as the infection noted at the site. It was also determined that she did have osteomyelitis which appears to be fairly significant. She was treated with vancomycin and in fact is still on IV vancomycin at dialysis currently. This is actually slated to continue until 09/12/18 at least which will be the completion of the six weeks of therapy. Nonetheless based on what I'm seeing at this point I'm not sure she will be anywhere near ready to discontinue antibiotics at that time. Since she was released from the hospital she was seen by [name] who is her podiatrist on 08/27/18. His note specifically states that he is recommended that the patient needs of one knee amputation on the right as she has a life-threatening situation that can lead quickly to sepsis. The patient advised she would like to try to save her leg to which [name] apparently told her that this was against all medical advice. She also want to discontinue the Wound VAC which had been initiated due to the fact that she wasn't pleased with how the wound was looking and subsequently she wanted to pursue applying Medihoney at that time. He stated that he did not believe that the right lower extremity was salvageable and that the  patient understood but would still like to attempt hyperbaric option therapy if it could be of any benefit. She was therefore referred back to [place] for further evaluation. He plans to see her back next week. Upon inspection today patient has a significant amount purulent drainage noted from the wound at this point. The bone in the distal portion of her foot also appears to be extremely necrotic and spongy. When I push down on the bone it bubbles and seeps purulent drainage from deeper in the end of the foot. I do not think that this is likely going to heal very well at all and less aggressive surgical debridement were undertaken more than what I believe we can likely do here in our office. 09/12/2018; I have not seen this patient since the most recent hospitalization although she was in our clinic last week. I have reviewed some of her records from a complex hospitalization. 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 There is situation was complicated by  MRSA bacteremia and acute on chronic renal failure now on dialysis. She is receiving vancomycin at dialysis. We started her on Dakin's wet-to- dry last week she is changing this daily. There is still purulent drainage coming out of her foot. Although she is apparently "agreeable" to a below-knee amputation which is been suggested by multiple clinicians she wants this to be done in [place]. She apparently has a telehealth visit with that provider sometime in late [place] 6/24. I have told her I think this is probably too long. Nevertheless I could not convince her to allow a local doctor to perform BKA. 09/19/2018; the patient has a large necrotic area on the right anterior foot. She has had previous transmetatarsal amputations. Culture I did last week showed MRSA nothing else she is on vancomycin at dialysis. She has continued leaking purulent drainage out of the distal part of the large circular wound  on the right anterior foot. She apparently went to see Dr. 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 Somehow that translated into her being referred to plastic surgery for debridement of the area. I gather she basically refused amputation although I do not have a copy of Dr. Berenice Primas notes. The patient really wants to have a trial of hyperbaric oxygen. I agreed with initial assessment in this clinic that this was probably too far along to benefit however if she is going to have plastic surgery I think she would benefit from ancillary hyperbaric oxygen. The issue here is that the patient has benefited as maximally as any patient I have ever seen from hyperbaric oxygen therapy. Most recently she had exposed bone on the lateral part of her left foot after a surgical procedure and that actually has closed. She has eschared areas in both heels but no open area. She is remained systemically well. I am not optimistic that anything can be done about this but the patient is very clear that she wants an attempt. The attempt would include a wound VAC further debridements and hyperbaric oxygen along with IV antibiotics. 6/26; I put her in for a trial of hyperbaric oxygen only because of the dramatic response she has had with wounds on her left midfoot earlier this year which was a surgical wound that went straight to her bone over the metatarsal heads and also remotely the left third toe. We will see if we can get this through our review process and insurance. She arrives in clinic with again purulent material pouring out of necrotic bone on the top of the foot distally. There is also some concerning erythema on the front of the leg that we marked. It is bit difficult to tell how tender this is because of neuropathy. I note from infectious disease that she had her vancomycin extended. 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 She had the wound VAC on  for part of the week. The rest of the time she is putting various things on this including Medihoney, "ionized water" silver sorb gel etc. 7/7; follow-up along with HBO. She is still on vancomycin at dialysis. She has a large open area on the dorsal right foot and a small dark eschar area on her heel. There is a lot less erythema in the area and a lot less tenderness. From an infection point of view I think this is better. She still has a lot of necrosis in the remaining right forefoot [previous TMA] we are still using the wound VAC in  this area 7/16; follow-up along with HBO. I put her on linezolid after she finished her vancomycin. We started this last Friday I gave her 2 weeks worth. I had the expectation that she would be operatively debrided by Dr. Marla Roe but that still has not happened yet. Patient phoned the office this week. She arrives for review today after HBO. The distal part of this wound is completely necrotic. Nonviable pieces of tendon bone was still purulent drainage. Also concerning that she has black eschar over the heel that is expanding. I think this may be indicative of infection in this area as well. She has less erythema and warmth in the ankle and calf but still an abnormal exam 7/21 follow-up along with HBO. I will renew her linezolid after checking a CBC with differential monitoring her blood counts especially her platelets. She was supposed to have surgery yesterday but if I am reading things correctly this was canceled after her blood sugar was found to be over 500. I thought Dr. Marla Roe who called me said that they were sending her to the ER but the patient states that was not the case. 7/28. Follow-up along with HBO. She is on linezolid I still do not have any lab work from dialysis even though I called last week. The patient is concerned about an area on her left lateral foot about the level of the base of her fifth metatarsal. I did not really see anything  that ominous here however this patient is in [place] ability to point out problems that she is sensing and she has been accurate in the past Finally she received a call from Dr. Marla Roe who is referring her to another orthopedic surgeon stating that she is too booked up to take her to the operating room now. Was still using a wound VAC on the foot 8/3 -Follow-up after HBO, she is got another week of linezolid, she is to call [facility] for an appointment, x-rays of both feet were reviewed, the left foot x-ray with third MTP joint osteo- Right foot x-ray widespread osteo-in the right midfoot Right ankle x-ray does not show any active evidence of infection 8/11-Patient is seen after HBO, the wounds on the right foot appear to be about the same, the heel wound had some necrotic base over tendon that was debrided with a curette 8/21; patient is seen after HBO. The patient's wound on her dorsal foot actually looks reasonably good and there is substantial amount of epithelialization however the open area distally still has a lot of necrotic debris partially bone. I cannot really get a good sense of just how deep this probes under the foot. She has been pressuring me this week to order medical maggots through a company in [place] for her. The problem I have is there is not a defined wound area here. On the positive side there is no purulence. She has been to see infectious disease she is still on Septra DS although I have not had a chance to review their notes 8/28; patient is seen in conjunction with HBO. The wounds on her foot continued to improve including the right dorsal foot substantially the, the distal part of this wound and the area on the right heel. We have been using a wound VAC over this chronically. She is still on trimethoprim as directed by infectious disease 9/4; patient is seen in conjunction with HBO. Right dorsal foot wound substantially anteriorly is better however she continues to  have a deep wound in  the distal part of this that is not responding. We have been using silver collagen under border foam ooArea on the right plantar medial heel seems better. We have been using Hydrofera Blue 12/12/18 on evaluation today patient appears to be doing about the same with regard to her wound based on prior measurements. She does have some necrotic tissue noted on the lateral aspect of the wound that is going require a little bit of sharp debridement today. This includes what appears to be potentially either severely necrotic bone or tendon. Nonetheless other than that she does not appear to have any severe infection which is good news 9/18; it is been 2 weeks since I saw this wound. She is tolerating HBO well. Continued dramatic improvement in the area on the right dorsal foot. She still has a small wound on the heel that we have been using Hydrofera Blue. She continues with a wound VAC 9/24; patient has to be seen emergently today with a swelling on her right lateral lower leg. She says that she told Dr. Evette Doffing about this and also myself on a couple of occasions but I really have no recollection of this. She is not systemically unwell and her wound really looked good the last time I saw this. She showed this to providers at dialysis and she was able to verify that she was started on cephalexin today for 5 doses at dialysis. She dialyzes on Tuesday Thursday and Saturday. 10/2; patient is seen in conjunction with HBO. The area that is draining on the right anterior medial tibia is more extensive. Copious amounts of serosanguineous drainage with some purulence. We are still using the wound VAC on the original wound then it is stable. Culture I did of the original IandD showed MRSA I contacted dialysis she is now on vancomycin with dialysis treatments. I asked them to run a month 10/9; patient seen in conjunction with HBO. She had a new spontaneous open area just above the wound on  the right medial tibia ankle. More swelling on the right medial tibia. Her wound on the foot looks about the same perhaps slightly better. There is no warmth spreading up her leg but no obvious erythema. her MRI of the foot and ankle and distal tib-fib is not booked for next Friday I discussed this with her in great detail over multiple days. it is likely she has spreading infection upper leg at least involving the distal 25% above the ankle. She knows that if I refer her to orthopedics for infectious disease they are going to recommend amputation and indeed I am not against this myself. We had a good trial at trying to heal the foot which is what she wanted along with antibiotics debridement and HBO however she clearly has spreading infection [probably staph aureus/MRSA]. Nevertheless she once again tells me she wants to wait the left of the MRI. She still makes comments about having her amputation done in [place]. 10/19; arrives today with significant swelling on the lateral right leg. Last culture I did showed Klebsiella. Multidrug- resistant. Cipro was intermediate sensitivity and that is what I have her on pending her MRI which apparently is going to be done on Thursday this week although this seems to be moving back and forth. She is not systemically unwell. We are using silver alginate on her major wound area on the right medial foot and the draining areas on the right lateral lower leg 10/26; MRI showed extensive abscess in the anterior compartment of the  right leg also widespread osteomyelitis involving osseous structures of the midfoot and portions of the hindfoot. Also suspicion for osteomyelitis anterior aspect of the distal medial malleolus. Culture I did of the purulence once again showed a multidrug-resistant Klebsiella. I have been in contact with nephrology late last week and she has been started on cefepime at dialysis to replace the vancomycin We sent a copy of her MRI  report to Dr. Geroge Baseman in [place] who is an orthopedic surgeon. The patient takes great stock in his opinion on this. She says she will go to [place] to have her leg amputated if Dr. Geroge Baseman does not feel there is any salvage options. 11/2; she still is not talk to her orthopedic surgeon in [place]. Apparently he will call her at [phone] this afternoon. The quality of this is she has not allowed me to refer her anywhere. She has been told over and over that she needs this amputated but has not agreed to be referred. She tells me her blood sugar was 600 last night but she has not been febrile. 11/9; she never did got a call from the orthopedic surgeon in [place] therefore that is off the radar. We have arranged to get her see orthopedic surgery at [facility]. She still has a lot of draining purulence coming out of the new abscess in her right leg although that probably came from the osteomyelitis in her right foot and heel. Meanwhile the original wound on the right foot looks very healthy. Continued improvement. The issue is that the last MRI showed osteomyelitis in her right foot extensively she now has an abscess in the right anterior lower leg. There is nobody in [place] who will offer this woman anything but an amputation and to be honest that is probably what she needs. I think she still wants to talk about limb salvage although at this point I just do not see that. She has completed her vancomycin at dialysis which was for the original staph aureus she is still on cefepime for the more recent Klebsiella. She has had a long course of both of these antibiotics which should have benefited the osteomyelitis on the right foot as well as the abscess. 11/16; apparently [place] elective surgery is shut down because of COVID-19 pandemic. I have reached out to some contacts at [name] to see if we can get her an orthopedic appointment there. I am concerned about continually leaving this  but for the moment everything is static. In fact her original large wound on this foot is closing down. It is the abscess on the right anterior leg that continues to drain purulent serosanguineous material. She is not currently on any antibiotics however she had a prolonged course of vancomycin [1 month] as well as cefepime for a month 02/24/2019 on evaluation today patient appears to be doing better than the last time I saw her. This is not a patient that I typically see. With that being said I am covering for Dr. Dellia Nims this week and again compared to when I last saw her overall the wounds in particular seem to be doing significantly better which is good news. With that being said the patient tells me several disconcerting things. She has not been able to get in to see anyone for potential debridement in regard to her leg wounds although she tells me that she does not think it is necessary any longer because she is taking care of that herself. She noticed a string coming out of the lower  wound on her leg over the last week. The patient states that she subsequently decided that we must of pack something in there and started pulling the string out and as it kept coming and coming she realized this was likely her tendon. With that being said she continued to remove as much of this as she could. She then I subsequently proceeded to using tubes of antibiotic ointment which she will stick down into the wound and then scored as much as she can until she sees it coming out of the other wound opening. She states that in doing this she is actually made things better and there is less redness and irritation. With regard to her foot wound she does have some necrotic tendon and tissue noted in one small corner but again the actual wound itself seems to be doing better with good granulation in general compared to my last evaluation. 12/7; continued improvement in the wound on the substantial part of the right  medial foot. Still a necrotic area inferiorly that required debridement but the rest of this looks very healthy and is contracting. She has 2 wounds on the right lateral leg which were her original drainage sites from her abscess but all of this looks a lot better as well. She has been using silver alginate after putting antibiotic biotic ointment in one wound and watching it come out the other. I have talked to her in some detail today. I had given her names of orthopedic surgeons at [facility] for second opinion on what to do about the right leg. I do not think the patient never called them. She has not been able to get a hold of the orthopedic surgeon in [place] that she had put a lot of faith in as being somebody would give her an opinion that she would trust. I talked to her today and said even if I could get her in to another orthopedic surgeon about the leg which she accept an amputation and she said she would not therefore I am not going to press this issue for the moment 12/14; continued improvement in his substantial wound on the right medial foot. There is still a necrotic area inferiorly with tightly adherent necrotic debris which I have been working on debriding each time she is here. She does not have an orthopedic appointment. Since last time she was here I looked over her cultures which were essentially MRSA on the foot wound and gram- negative rods in the abscess on the anterior leg. 12/21; continued improvement in the area on the right medial foot. She is not up on this much and that is probably a good thing since I do not know it could support continuous ambulation. She has a small area on the right lateral leg which were remanence of the IandD's I did because of the abscess. I think she should probably have prophylactic antibiotics I am going to have to look this over to see if we can make an intelligent decision here. In the meantime her major wound is come down nicely.  Necrotic area inferiorly is still there but looks a lot better 04/06/2019; she has had some improvement in the overall surface area on the right medial foot somewhat narrowedr both but somewhat longer. The areas on the right lateral leg which were initial IandD sites are superficial. Nothing is present on the right heel. We are using silver alginate to the wound areas 1/18; right medial foot somewhat smaller. Still a deep probing area  in the most distal recess of the wound. She has nothing open on the right leg. She has a new wound on the plantar aspect of her left fourth toe which may have come from just pulling skin. The patient using Medihoney on the wound on her foot under silver alginate. I cannot discourage her from this 2/1; 2-week follow-up using silver alginate on the right foot and her left fourth toe. The area on the right dorsal foot is contracted although there is still the deep area in the most distal part of the wound but still has some probing depth. No overt infection Objective Constitutional Patient is hypertensive.. Pulse regular and within target range for patient.Marland Kitchen Respirations regular, non-labored and within target range.. Temperature is normal and within the target range for the patient.Marland Kitchen Appears in no distress. Vitals Time Taken: 1:20 PM, Height: 67 in, Weight: 125 lbs, BMI: 19.6, Temperature: 97.8 F, Pulse: 86 bpm, Respiratory Rate: 20 breaths/min, Blood Pressure: 158/79 mmHg. General Notes: Wound exam; generally continued improvement in the surface area of the wound on the right dorsal foot. Distally there is still a small area with depth I used chemical cauterization on this to try and pull the wound together. ooLeft fourth toe is epithelialized in the middle of 2 small open areas. Integumentary (Hair, Skin) Wound #43 status is Open. Original cause of wound was Gradually Appeared. The wound is located on the Right,Medial Foot. The wound measures 4.2cm length x 2.4cm  width x 2.1cm depth; 7.917cm^2 area and 16.625cm^3 volume. There is muscle, tendon, and Fat Layer (Subcutaneous Tissue) Exposed exposed. There is no tunneling or undermining noted. There is a medium amount of serosanguineous drainage noted. The wound margin is flat and intact. There is small (1-33%) pink, friable granulation within the wound bed. There is a large (67-100%) amount of necrotic tissue within the wound bed including Adherent Slough and Necrosis of Muscle. Wound #48 status is Open. Original cause of wound was Trauma. The wound is located on the Left Toe Fourth. The wound measures 0.6cm length x 0.7cm width x 0.1cm depth; 0.33cm^2 area and 0.033cm^3 volume. There is Fat Layer (Subcutaneous Tissue) Exposed exposed. There is no tunneling or undermining noted. There is a small amount of serosanguineous drainage noted. The wound margin is flat and intact. There is large (67-100%) red granulation within the wound bed. There is no necrotic tissue within the wound bed. 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 Dressing Change Frequency: Wound #43 Right,Medial Foot: Change dressing three times week. Wound #48 Left Toe Fourth: Change dressing three times week. Wound Cleansing: Clean wound with Wound Cleanser - Anasept to all wounds Primary Wound Dressing: Wound #43 Right,Medial Foot: Calcium Alginate with Silver - lightly pack Wound #48 Left Toe Fourth: Calcium Alginate with Silver Secondary Dressing: Wound #43 Right,Medial Foot: Kerlix/Rolled [name] ABD pad Wound #48 Left Toe Fourth: Kerlix/Rolled [name] Dry [name] Edema Control: Elevate legs to the level of the heart or above  for 30 minutes daily and/or when sitting, a frequency of: - throughout the day Home Health: [facility] for wound care. - Interim 1. I am still going to use silver alginate 2. Careful attention to the distal probing area 3. She reminds  me that she wants to get on the renal transplant list but cannot do so as long as she has an "active infection" Electronic Signature(s) Signed: 05/04/2019 6:39:44 PM By: [name] RN, BSN Signed: 05/04/2019 6:41:12 PM By: Linton Ham MD Entered By: [name] on 05/04/2019 18:30:34 -------------------------------------------------------------------------------- SuperBill Details Patient Name: Date of Service: [name] 05/04/2019 Medical Record Number:[id number] Patient Account Number: [id number] Date of Birth/Sex: Treating RN: Sep 09, 1970 (49 y.o. F) Primary Care Provider: [name] Other Clinician: Referring Provider: Treating Provider/Extender:Rudell Marlowe, [name], [name] in Treatment: 34 Diagnosis Coding ICD-10 Codes Code Description [phone] Other chronic osteomyelitis, right ankle and foot L97.514 Non-pressure chronic ulcer of other part of right foot with necrosis of bone E10.621 Type 1 diabetes mellitus with foot ulcer L97.521 Non-pressure chronic ulcer of other part of left foot limited to breakdown of skin Facility Procedures CPT4 Code: 64158309 Description: 99214 - WOUND CARE VISIT-LEV 4 EST PT Modifier: Quantity: 1 Physician Procedures CPT4 Code Description: 4076808 99213 - WC PHYS LEVEL 3 - EST PT ICD-10 Diagnosis Description M86.671 Other chronic osteomyelitis, right ankle and foot L97.514 Non-pressure chronic ulcer of other part of right foot L97.521 Non-pressure chronic ulcer of  other part of left foot l Modifier: with necrosis of imited to breakdo Quantity: 1 bone wn of skin Electronic Signature(s) Signed: 05/04/2019 6:39:44 PM By: [name] RN, BSN Signed: 05/04/2019  6:41:12 PM By: Linton Ham MD Entered By: [name] on 05/04/2019 18:31:29

## 2019-05-04 NOTE — Progress Notes (Addendum)
[name], [name] ([id number]) Visit Report for 05/04/2019 Arrival Information Details Patient Name: Date of Service: [name], [name] 05/04/2019 1:00 PM Medical Record Number:[id number] Patient Account Number: [id number] Date of Birth/Sex: Treating RN: 1970/11/29 (49 y.o. [name], [name] Primary Care [name]: [name] Other Clinician: Referring [name]: Treating [name], [name], [name]: 37 Visit Information History Since Last Visit Added or deleted any medications: No Patient Arrived: Wheel Chair Any new allergies or adverse reactions: No Arrival Time: 13:20 Had a fall or experienced change in No activities of daily living that may affect Accompanied By: self risk of falls: Transfer Assistance: None Signs or symptoms of abuse/neglect since last No Patient Identification Verified: Yes visito Secondary Verification Process Completed: Yes Hospitalized since last visit: No Patient Requires Transmission-Based No Implantable device outside of the clinic excluding No Precautions: cellular tissue based products placed in the center Patient Has Alerts: No since last visit: Has Dressing in Place as Prescribed: Yes Pain Present Now: No Electronic Signature(s) Signed: 05/04/2019 6:01:21 PM By: [name] Entered By: [name] on 05/04/2019 13:25:53 -------------------------------------------------------------------------------- Clinic Level of Care Assessment Details Patient Name: Date of Service: [name] 05/04/2019 1:00 PM Medical Record Number:[id number] Patient Account Number: [id number] Date of Birth/Sex: Treating RN: [birth year] (49 y.o. [name] Primary Care [name]: [name] Other Clinician: Referring [name]: Treating [name]/Extender:[name], [name], [name]: 51 Clinic Level of Care Assessment Items TOOL 4 Quantity Score X - Use when only an EandM is  performed on FOLLOW-UP visit 1 0 ASSESSMENTS - Nursing Assessment / Reassessment X - Reassessment of Co-morbidities (includes updates in patient status) 1 10 X - Reassessment of Adherence to [name] Plan 1 5 ASSESSMENTS - Wound and Skin Assessment / Reassessment []  - Simple Wound Assessment / Reassessment - one wound 0 X - Complex Wound Assessment / Reassessment - multiple wounds 2 5 []  - Dermatologic / Skin Assessment (not related to wound area) 0 ASSESSMENTS - Focused Assessment []  - Circumferential Edema Measurements - multi extremities 0 []  - Nutritional Assessment / Counseling / Intervention 0 X - Lower Extremity Assessment (monofilament, tuning fork, pulses) 1 5 []  - Peripheral Arterial Disease Assessment (using hand held doppler) 0 ASSESSMENTS - Ostomy and/or Continence Assessment and Care []  - Incontinence Assessment and Management 0 []  - Ostomy Care Assessment and Management (repouching, etc.) 0 PROCESS - Coordination of Care X - Simple Patient / Family Education for ongoing care 1 15 []  - Complex (extensive) Patient / Family Education for ongoing care 0 X - Staff obtains Programmer, systems, Records, Test Results / Process Orders 1 10 X - Staff telephones HHA, Nursing Homes / Clarify orders / etc 1 10 []  - Routine Transfer to another Facility (non-emergent condition) 0 []  - Routine Hospital Admission (non-emergent condition) 0 []  - New Admissions / Biomedical engineer / Ordering NPWT, Apligraf, etc. 0 []  - Emergency Hospital Admission (emergent condition) 0 X - Simple Discharge Coordination 1 10 []  - Complex (extensive) Discharge Coordination 0 PROCESS - Special Needs []  - Pediatric / Minor Patient Management 0 []  - Isolation Patient Management 0 []  - Hearing / Language / Visual special needs 0 []  - Assessment of Community assistance (transportation, D/C planning, etc.) 0 []  - Additional assistance / Altered mentation 0 []  - Support Surface(s) Assessment (bed, cushion, seat,  etc.) 0 INTERVENTIONS - Wound Cleansing / Measurement []  - Simple Wound Cleansing - one wound 0 X - Complex Wound Cleansing - multiple wounds 2 5 X - Wound  Imaging (photographs - any number of wounds) 1 5 []  - Wound Tracing (instead of photographs) 0 []  - Simple Wound Measurement - one wound 0 X - Complex Wound Measurement - multiple wounds 2 5 INTERVENTIONS - Wound Dressings []  - Small Wound Dressing one or multiple wounds 0 X - Medium Wound Dressing one or multiple wounds 1 15 []  - Large Wound Dressing one or multiple wounds 0 X - Application of Medications - topical 1 5 []  - Application of Medications - injection 0 INTERVENTIONS - Miscellaneous []  - External ear exam 0 []  - Specimen Collection (cultures, biopsies, blood, body fluids, etc.) 0 []  - Specimen(s) / Culture(s) sent or taken to Lab for analysis 0 []  - Patient Transfer (multiple staff / Civil Service fast streamer / Similar devices) 0 []  - Simple Staple / Suture removal (25 or less) 0 []  - Complex Staple / Suture removal (26 or more) 0 []  - Hypo / Hyperglycemic Management (close monitor of Blood Glucose) 0 []  - Ankle / Brachial Index (ABI) - do not check if billed separately 0 X - Vital Signs 1 5 Has the patient been seen at the hospital within the last three years: Yes Total Score: 125 Level Of Care: New/Established - Level 4 Electronic Signature(s) Signed: 05/04/2019 6:39:44 PM By: Nancy Hurst RN, BSN Entered By: [name] on 05/04/2019 18:31:19 -------------------------------------------------------------------------------- Encounter Discharge Information Details Patient Name: Date of Service: [name] 05/04/2019 1:00 PM Medical Record Number:[id number] Patient Account Number: [id number] Date of Birth/Sex: Treating RN: [birth year] (49 y.o. [name] Primary Care [name]: [name] Other Clinician: Referring Kenderick Kobler: Treating [name]:[name], [name], [name]:  30 Encounter Discharge Information Items Discharge Condition: Stable Ambulatory Status: Wheelchair Discharge Destination: Home Transportation: Other Accompanied By: self Schedule Follow-up Appointment: Yes Clinical Summary of Care: Patient Declined Electronic Signature(s) Signed: 05/04/2019 6:01:02 PM By: [name] Entered By: [name] on 05/04/2019 15:45:46 -------------------------------------------------------------------------------- Lower Extremity Assessment Details Patient Name: Date of Service: [name] 05/04/2019 1:00 PM Medical Record Number:[id number] Patient Account Number: [id number] Date of Birth/Sex: Treating RN: Sep 09, 1970 (49 y.o. [name] Primary Care Trent Gabler: [name] Other Clinician: Referring Undrea Archbold: Treating Wilba Mutz/Extender:[name], [name], [name]: 34 Edema Assessment Assessed: [Left: Yes] [Right: Yes] Edema: [Left: No] [Right: No] Calf Left: Right: Point of Measurement: cm From Medial Instep 30.5 cm 26 cm Ankle Left: Right: Point of Measurement: cm From Medial Instep 19 cm 18 cm Electronic Signature(s) Signed: 05/04/2019 6:01:21 PM By: [name] Entered By: [name] on 05/04/2019 13:28:14 -------------------------------------------------------------------------------- Multi Wound Chart Details Patient Name: Date of Service: [name] 05/04/2019 1:00 PM Medical Record Number:[id number] Patient Account Number: [id number] Date of Birth/Sex: Treating RN: Mar 14, 1971 (49 y.o. 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] [48:Lower Extremity] Comorbid History: [43:Cataracts, Chronic sinus Cataracts, Chronic sinus N/A problems/congestion, Anemia, Sleep Apnea, Deep Anemia, Sleep Apnea, Deep Vein Thrombosis, Hypertension, Peripheral Hypertension, Peripheral Arterial Disease, Type I Diabetes,  Osteoarthritis, Osteomyelitis, Neuropathy, Osteomyelitis, Neuropathy, Seizure Disorder] [48:problems/congestion, Vein Thrombosis, Arterial Disease, Type I Diabetes, Osteoarthritis, Seizure Disorder] Date Acquired: [43:08/04/2018] [48:04/17/2019] [N/A:N/A] Weeks of [name]: [  43:34] [48:2] [N/A:N/A] Wound Status: [43:Open] [48:Open] [N/A:N/A] Clustered Wound: [43:Yes] [48:No] [N/A:N/A] Clustered Quantity: [43:3] [48:N/A] [N/A:N/A] Measurements L x W x D 4.2x2.4x2.1 [48:0.6x0.7x0.1] [N/A:N/A] (cm) Area (cm) : [46:5.681] [48:0.33] [N/A:N/A] Volume (cm) : [27:51.700] [48:0.033] [N/A:N/A] % Reduction in Area: [43:89.10%] [48:-251.10%] [N/A:N/A] % Reduction in Volume: 23.60% [48:-266.70%] [N/A:N/A] Classification: [43:Grade 4] [48:Grade 1] [N/A:N/A] Exudate Amount: [43:Medium] [48:Small] [N/A:N/A] Exudate Type: [43:Serosanguineous] [48:Serosanguineous] [N/A:N/A] Exudate Color: [43:red, brown] [48:red, brown] [N/A:N/A] Wound Margin: [43:Flat and Intact] [48:Flat and Intact] [N/A:N/A] Granulation Amount: [43:Small (1-33%)] [48:Large (67-100%)] [N/A:N/A] Granulation Quality: [43:Pink, Friable] [48:Red] [N/A:N/A] Necrotic Amount: [43:Large (67-100%)] [48:None Present (0%)] [N/A:N/A] Exposed Structures: [43:Fat Layer (Subcutaneous Fat Layer (Subcutaneous N/A Tissue) Exposed: Yes Tendon: Yes Muscle: Yes Fascia: No Joint: No Bone: No] [48:Tissue) Exposed: Yes Fascia: No Tendon: No Muscle: No Joint: No Bone:  No] Epithelialization: [43:Large (67-100%) Chemical Cauterization] [48:Large (67-100%) N/A] [name] Notes Electronic Signature(s) Signed: 05/04/2019 6:41:12 PM By: Linton Ham MD Entered By: Linton Ham on 05/04/2019 15:14:28 -------------------------------------------------------------------------------- Multi-Disciplinary Care Plan Details Patient Name: Date of Service: [name] 05/04/2019 1:00 PM Medical Record Number:[id number] Patient Account Number: [id number] Date of Birth/Sex: Treating RN: Feb 04, 1971 (49 y.o. [name] Primary Care Anureet Bruington: [name] Other Clinician: Referring Maire Govan: Treating Mirenda Baltazar/Extender:[name], [name], [name]: 15 Active Inactive Wound/Skin Impairment Nursing Diagnoses: Impaired tissue integrity Knowledge deficit related to ulceration/compromised skin integrity Goals: Patient/caregiver will verbalize understanding of skin care regimen Date Initiated: 09/03/2018 Target Resolution Date: 06/05/2019 Goal Status: Active Ulcer/skin breakdown will have a volume reduction of 30% by week 4 Date Initiated: 09/03/2018 Date Inactivated: 10/07/2018 Target Resolution Date: 10/01/2018 Unmet Goal Status: Unmet Reason: COMORBITIES Ulcer/skin breakdown will have a volume reduction of 50% by week 8 Date Initiated: 10/07/2018 Date Inactivated: 11/03/2018 Target Resolution Date: 10/31/2018 Unmet Goal Status: Unmet Reason: Osteomyelitis Interventions: Assess patient/caregiver ability to obtain necessary supplies Assess patient/caregiver ability to perform ulcer/skin care regimen upon admission and as needed Assess ulceration(s) every visit Provide education on ulcer and skin care [name] Activities: Skin care regimen initiated : 09/03/2018 Topical wound management initiated : 09/03/2018 Notes: Electronic Signature(s) Signed: 05/04/2019 6:39:44 PM By: Nancy Hurst RN, BSN Entered By: [name] on 05/04/2019  14:11:34 -------------------------------------------------------------------------------- Pain Assessment Details Patient Name: Date of Service: [name] 05/04/2019 1:00 PM Medical Record Number:[id number] Patient Account Number: [id number] Date of Birth/Sex: Treating RN: 1970/12/14 (49 y.o. [name], [name] Primary Care Jari Carollo: [name] Other Clinician: Referring Eirene Rather: Treating Stephanny Tsutsui/Extender:[name], [name], [name]: 59 Active Problems Location of Pain Severity and Description of Pain Patient Has Paino No Site Locations Pain Management and Medication Current Pain Management: Medication: No Cold Application: No Rest: No Massage: No Activity: No T.E.N.S.: No Heat Application: No Leg drop or elevation: No Is the Current Pain Management Adequate: Adequate How does your wound impact your activities of daily livingo Sleep: No Bathing: No Appetite: No Relationship With Others: No Bladder Continence: No Emotions: No Bowel Continence: No Work: No Toileting: No Drive: No Dressing: No Hobbies: No Electronic Signature(s) Signed: 05/04/2019 6:01:21 PM By: [name] Entered By: [name] on 05/04/2019 13:27:31 -------------------------------------------------------------------------------- Patient/Caregiver Education Details Patient Name: Date of Service: [name], Nancy L. 2/1/2021andnbsp1:00 PM Medical Record Number:[id number] Patient Account Number: [id number] Date of Birth/Gender: Treating RN: [birth year] (49 y.o. [name] Primary Care Physician: [name] Other Clinician: Referring Physician: Treating Physician/Extender:[name], [name], [name]: 26 Education Assessment Education Provided To: Patient Education Topics Provided Wound/Skin Impairment: Methods: Explain/Verbal Responses: State  content correctly Electronic Signature(s) Signed: 05/04/2019 6:39:44 PM By: Nancy Hurst RN, BSN Entered By: [name] on 05/04/2019 14:11:50 -------------------------------------------------------------------------------- Wound Assessment Details Patient Name: Date of Service: [name] 05/04/2019 1:00 PM Medical Record Number:[id number] Patient Account Number: [id number] Date of Birth/Sex: Treating RN: [birth year] (49 y.o. F) Primary Care Jaymari Cromie: [name] Other Clinician: Referring Harvey Lingo: Treating [place] Chelf/Extender:[name], [name], [name]: 34 Wound Status Wound Number: 43 Primary Diabetic Wound/Ulcer of the Lower Extremity Etiology: Wound Location: Right Foot - Medial Wound Open Wounding Event: Gradually Appeared Status: Date Acquired: 08/04/2018 Comorbid Cataracts, Chronic sinus problems/congestion, Weeks Of [name]: 34 History: Anemia, Sleep Apnea, Deep Vein Thrombosis, Clustered Wound: Yes Clustered Wound: Yes Hypertension, Peripheral Arterial Disease, Type I Diabetes, Osteoarthritis, Osteomyelitis, Neuropathy, Seizure Disorder Photos Wound Measurements Length: (cm) 4.2 Width: (cm) 2.4 Depth: (cm) 2.1 Clustered Quantity: 3 Area: (cm) 7.917 Volume: (cm) 16.625 Wound Description Classification: Grade 4 Wound Margin: Flat and Intact Exudate Amount: Medium Exudate Type: Serosanguineous Exudate Color: red, brown Wound Bed Granulation Amount: Small (1-33%) Granulation Quality: Pink, Friable Necrotic Amount: Large (67-100%) Necrotic Quality: Adherent Slough r After Cleansing: No ibrino Yes Exposed Structure posed: No (Subcutaneous Tissue) Exposed: Yes posed: Yes posed: Yes osis of Muscle: Yes osed: No sed: No % Reduction in Area: 89.1% % Reduction in Volume: 23.6% Epithelialization: Large (67-100%) Tunneling: No Undermining: No Foul Odo Slough/F Fascia Ex Fat Layer Tendon Ex Muscle Ex Necr Joint Exp Bone Expo [name] Notes Wound #43 (Right, Medial Foot) 1. Cleanse  With Wound Cleanser 3. Primary Dressing Applied Calcium Alginate Ag 4. Secondary Dressing ABD Pad Dry [name] Roll [name] Electronic Signature(s) Signed: 05/12/2019 4:28:10 PM By: [name] EMT/HBOT Previous Signature: 05/04/2019 6:01:21 PM Version By: [name] Entered By: [name] on 05/12/2019 15:00:53 -------------------------------------------------------------------------------- Wound Assessment Details Patient Name: Date of Service: [name] 05/04/2019 1:00 PM Medical Record Number:[id number] Patient Account Number: [id number] Date of Birth/Sex: Treating RN: 07-10-1970 (49 y.o. F) Primary Care Addysin Porco: [name] Other Clinician: Referring Pascuala Klutts: Treating Jhovany Weidinger/Extender:[name], [name], [name]: 34 Wound Status Wound Number: 13 Primary Diabetic Wound/Ulcer of the Lower Extremity Etiology: Wound Location: Left Toe Fourth Wound Open Wounding Event: Trauma Status: Date Acquired: 04/17/2019 Comorbid Cataracts, Chronic sinus problems/congestion, Weeks Of [name]: 2 History: Anemia, Sleep Apnea, Deep Vein Thrombosis, Clustered Wound: No Hypertension, Peripheral Arterial Disease, Type I Diabetes, Osteoarthritis, Osteomyelitis, Neuropathy, Seizure Disorder Photos Wound Measurements Length: (cm) 0.6 % Reduction in Ar Width: (cm) 0.7 % Reduction in Vo Depth: (cm) 0.1 Epithelialization Area: (cm) 0.33 Tunneling: Volume: (cm) 0.033 Undermining: Wound Description Classification: Grade 1 Foul Odor After C Wound Margin: Flat and Intact Slough/Fibrino Exudate Amount: Small Exudate Type: Serosanguineous Exudate Color: red, brown Wound Bed Granulation Amount: Large (67-100%) Granulation Quality: Red Fascia Exposed: Necrotic Amount: None Present (0%) Fat Layer (Subcut Tendon Exposed: Muscle Exposed: Joint Exposed: Bone Exposed: [name] Notes Wound #48 (Left Toe Fourth) 1. Cleanse With Wound Cleanser 3.  Primary Dressing Applied Calcium Alginate Ag 4. Secondary Dressing Dry [name] 5. Secured With Tape leansing: No No Exposed Structure No aneous Tissue) Exposed: Yes No No No No ea: -251.1% lume: -266.7% : Large (67-100%) No No Electronic Signature(s) Signed: 05/12/2019 4:28:10 PM By: [name] EMT/HBOT Previous Signature: 05/04/2019 6:01:21 PM Version By: [name] Entered By: [name] on 05/12/2019 14:59:30 -------------------------------------------------------------------------------- Vitals Details Patient Name: Date of Service: [name] 05/04/2019 1:00 PM Medical Record Number:[id number] Patient Account Number: [id number] Date of Birth/Sex: Treating RN: Sep 09, 1970 (48  y.o. [name], [name] Primary Care Laelia Angelo: [name] Other Clinician: Referring Tonique Mendonca: Treating Schuyler Olden/Extender:[name], [name], [name]: 79 Vital Signs Time Taken: 13:20 Temperature (F): 97.8 Height (in): 67 Pulse (bpm): 86 Weight (lbs): 125 Respiratory Rate (breaths/min): 20 Body Mass Index (BMI): 19.6 Blood Pressure (mmHg): 158/79 Reference Range: 80 - 120 mg / dl Electronic Signature(s) Signed: 05/04/2019 6:01:21 PM By: [name] Entered By: [name] on 05/04/2019 13:26:44

## 2019-05-05 NOTE — Anesthesia Postprocedure Evaluation (Signed)
Anesthesia Post Note  Patient: [name]  Procedure(s) Performed: SECOND STAGE BASCILIC VEIN TRANSPOSITION LEFT ARM (Left Arm Upper)     Patient location during evaluation: PACU Anesthesia Type: General Level of consciousness: awake and alert Pain management: pain level controlled Vital Signs Assessment: post-procedure vital signs reviewed and stable Respiratory status: spontaneous breathing, nonlabored ventilation, respiratory function stable and patient connected to nasal cannula oxygen Cardiovascular status: blood pressure returned to baseline and stable Postop Assessment: no apparent nausea or vomiting Anesthetic complications: no    Last Vitals:  Vitals:   04/29/19 1000 04/29/19 1055  BP: 137/64   Pulse:    Resp: 18   Temp: 36.8 C 36.9 C  SpO2: 100%     Last Pain:  Vitals:   04/29/19 1055  TempSrc:   PainSc: 3                  Rafaela Dinius

## 2019-05-06 ENCOUNTER — Telehealth: Payer: Self-pay | Admitting: Nutrition

## 2019-05-06 NOTE — Telephone Encounter (Signed)
Patient got a call from [facility] saying that they closed the file on her because the 3 pages of faxes they sent [place] were never sent back to them.  She is out of [employer] pump supplies, and I have left some up front for her.  Please fix this tomorrow????  Not sure what to do now, because am not sure who got the paperwork:, [employer], or [facility], or a supplier.

## 2019-05-07 NOTE — Telephone Encounter (Signed)
According to prior documentation, [facility] pharmacy was contacted on 04/10/19 and they had all necessary paperwork that was needed for this pt.  In recently faxed items, there are two sets of paperwork. The first is for [place], and it is a standard written order for the T-slim pump and corresponding supplies. This was faxed to [facility] on 04/20/2018 with a fax confirmation showing that it was successful. The 3 most recent chart notes were also faxed with this, per the request of [place]. On 04/28/2019, the same fax was sent again per [facility] request. This fax was confirmed on 04/28/2019. As for paperwork from [facility], there is no record of this ever having been received. Will contact [employer].

## 2019-05-07 NOTE — Telephone Encounter (Signed)
What is the number for the [employer] Rep that is assigned to this office? I will contact and find out more information.

## 2019-05-08 NOTE — Telephone Encounter (Signed)
Pts guardian came to pick up supplies for medtronic pump. There were none left at the front. I was able to find 3 reservoirs for her to take back to the pt

## 2019-05-18 ENCOUNTER — Encounter (HOSPITAL_BASED_OUTPATIENT_CLINIC_OR_DEPARTMENT_OTHER): Payer: Medicare HMO | Attending: Internal Medicine | Admitting: Internal Medicine

## 2019-05-18 ENCOUNTER — Other Ambulatory Visit: Payer: Self-pay

## 2019-05-18 DIAGNOSIS — E10621 Type 1 diabetes mellitus with foot ulcer: Secondary | ICD-10-CM | POA: Diagnosis not present

## 2019-05-18 NOTE — Progress Notes (Signed)
[name], [name] ([id number]) Visit Report for 05/18/2019 HPI Details Patient Name: Date of Service: [name], [name] 05/18/2019 1:00 PM Medical Record Number:[id number] Patient Account Number: [id number] Date of Birth/Sex: Treating RN: 1970/12/04 (49 y.o. Nancy Fetter Primary Care Provider: [name] Other Clinician: Referring Provider: Treating Provider/Extender:Dontrel Smethers, [name], [name] in Treatment: 39 History of Present Illness HPI Description: 49 y.o diabetic who is known to have type 1 diabetes which is poorly controlled last hemoglobin A1c was 11%. She comes in with a ulcerated area on the left lateral foot which has been there for over 6 months. Was recently she has been treated by [name] of podiatry who saw her last on 05/28/2016. Review of his notes revealed that the patient had incision and drainage with placement of antibiotic beads to the left foot on 04/11/2016 for possible osteomyelitis of the cuboid bone. Over the last year she's had a history of amputation of the left fifth toe and a femoropopliteal popliteal bypass graft somewhere in April 2017. 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 His note [name] mentions that the patient has been referred to me for further wound care and possibly great candidate for hyperbaric oxygen therapy due to recurrent osteomyelitis. However we do not have any x-rays of biopsy reports confirming this. He has been on several antibiotics including Bactrim and most recently is on doxycycline for an MRSA. I understand, the patient was not a candidate for IV antibiotics as she has had previous PICC lines which resulted in blood clots in both arms. There was a x-ray report dated 04/04/2016 on [name] notes which showed evidence of fifth ray resection left foot with osteolytic changes noted to the fourth metatarsal and cuboid bone on the left. 06/13/2016 -- had a left foot x-ray which showed no  acute fracture or dislocation and no definite radiographic evidence of osteomyelitis. Advanced osteopenia was seen. 06/20/2016 -- she has noticed a new wound on the right plantar foot in the region where she had a callus before. 06/27/16- the patient did have her x-ray of the right foot which showed no findings to suggest osteomyelitis. She saw her endocrinologist, [name], yesterday. Her A1c in January was 11. He also indicates mismanagement and noncompliance regarding her diabetes. She is currently on Bactrim for a lip infection. She is complaining of nausea, vomiting and diarrhea. She is unable to articulate the exact orders or dosing of the Bactrim; it is unclear when she will complete this. 07/04/2016 -- results from [facility] of ABIs with ankle waveforms were noted from 02/14/2016. The examination done on 06/27/2015 showed noncompressible ABIs with the right being 1.45 and the left being 1.33. The present examination showed a right ABI of 1.19 on the left of 1.33. The conclusion was that right normal ABI in the lower extremity at rest however compared to previous study which was noncompressible ABI may be falsely elevated side suggesting medial calcification. The left ABI suggested medial calcification. 08/01/2016 -- the patient had more redness and pain on her right foot and did not get to come to see as noted she see her PCP or go to the ER and decided to take some leftover metronidazole which she had at home. As usual, the patient does report she feels and is rather noncompliant. 08/08/2016 -- -- x-ray of the right foot -- FINDINGS:Transmetatarsal amputation is noted. No bony destruction is noted to suggest osteomyelitis. IMPRESSION: No evidence of osteomyelitis. Postsurgical changes are seen. MRI would be more sensitive  for possible bony changes. Culture has grown Serratia Marcescens -- sensitive to Bactrim, ciprofloxacin, ceftazidime she was seen by [name] on 08/06/2016.  He did not find any exposed bone, muscle, tendon, ligament or joint. There was no malodor and he did a excisional debridement in the office. ============ Old notes: 49 year old patient who is known to the wound clinic for a while had been away from the wound clinic since 09/01/2014. Over the last several months she has been admitted to various hospitals including [place] at [facility]. She was treated for a right metatarsal osteomyelitis with a transmetatarsal amputation and this was done about 2 months ago. He has a small ulcerated area on the right heel and she continues to have an ulcerated area on the left plantar aspect of the foot. The patient was recently admitted to the [facility] hospital group between 7/12 and 10/18/2014. she was given 3 weeks of IV vancomycin and was to follow-up with her surgeons at [facility] and also took oral vancomycin for C. difficile colitis. 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 MRI of the left foot done on 10/14/2014 showed no abscess or osteomyelitis. 04/27/15; this is a patient we know from previous stays in the wound care center. She is a type I diabetic I am not sure of her control currently. Since the last time I saw her she is had a right transmetatarsal amputation and has no wounds on her right foot and has no open wounds. She is been followed at the wound care center at [facility] in [place]. She comes today with the desire to undergo hyperbaric treatment locally. Apparently one of her wound care providers in [place] has suggested hyperbarics. This is in response to an MRI from 04/18/15 that showed increased marrow signal and loss of the proximal fifth  metatarsal cortex evidence of osteomyelitis with likely early osteomyelitis in the cuboid bone as well. She has a large wound over the base of the fifth metatarsal. She also has a eschar over her the tips of her toes on 1,3 and 5. She does not have peripheral pulses and apparently is going for an angiogram tomorrow which seems reasonable. After this she is going to infectious disease at [facility]. They have been using Medihoney to the large wound on the lateral aspect of the left foot to. The patient has known Charcot deformity from diabetic neuropathy. She also has known diabetic PAD. Surprisingly I can't see that she has had any recent antibiotics, the patient states the last antibiotic she had was at the end of November for 10 days. I think this was in response to culture that showed group G strep although I'm not exactly sure where the culture was from. She is also had arterial studies on 03/29/15. This showed a right ABI of 1.4 that was noncompressible. Her left ABI was 0.73. 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 It was not felt that arterial inflow was adequate for healing of a foot ulcer. Her Doppler waveforms looked monophasic ===== READMISSION 02/28/17; this is in an now 49 year old woman we've had at several different occasions in this clinic. 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 She has a remote ex-smoker. She was last seen in this  clinic by Dr. Con Memos I think in May. More recently she is been followed by her podiatrist [name] an infectious disease Dr. Megan Salon. She has 2 open wounds the major one is over the right first metatarsal head she also has a wound on the left plantar foot. an MRI of the right foot on 01/01/17 showed a soft tissue ulcer along the plantar aspect of the first metatarsal base consistent with osteomyelitis of the first metatarsal stump. Dr. 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 According to the patient this is been open for slightly over a month. She has been on a combination of Cipro 500 twice a day, Zyvox 600 twice a day and Flagyl 500 3 times a day for over a month now as directed by Dr. Megan Salon. cultures of the right foot earlier this year showed MRSA in January and Serratia in May. January also had a few viridans strep. Recent x-rays of both feet were done and [name] office and I don't have these reports. The patient has known PAD and has a history of aleft femoropopliteal bypass in April 2017. 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 Dwyane Dee. In spite of this the last hemoglobin A1c I can see is 10.1 on 01/01/2017. She is being referred by 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 She is been using Medihoney to this area and also an area on the left midfoot. She is using healing sandals bilaterally. ABIs in this clinic at the left posterior tibial was 1.1 noncompressible on the right READMISSION Non invasive vascular NOVANT 5/18 Aftercare following surgery of the circulatory system Procedure Note - Interface, External Ris In - 08/13/2016 11:05 AM EDT Procedure: Examination consists of physiologic resting arterial pressures of the brachial and ankle arteries bilaterally with continuous wave Doppler waveform analysis. Previous: Previous exam performed on 02/14/16 demonstrated ABIs of Rt = 1.19 and Lt = 1.33. Right: ABI = non-compressible PT, 1.47 DP. S/P transmet amputation. Left: ABI = 1.52, 2nd digit pressure = 87 mmHg Conclusions: Right: ABI (>1.3) may be falsely elevated, suggesting medial calcification. Left: ABI (>1.3) may be falsely  elevated, suggesting medial calcification The patient is a now 49 year old type I diabetic is had multiple issues her graded to chronic diabetic foot ulcers. 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 We had her in the clinic lastin November. At that point she had wounds on her bilateral feet.she had wanted to try hyperbarics however the healogics review process denied her because she hadn't followed up with her vascular surgeon for her left femoropopliteal bypass. The bypass was done by Dr. Raul Del at [facility]. We made her a follow-up with Dr. Raul Del however she did not keep the appointment and therefore she was not approved The patient shows me a small wound on her left fourth metatarsal head on her phone. She developed rapid discoloration in the plantar aspect of the left foot and she was admitted to hospital from 2/2 through 05/10/17 with wet gangrene of the left foot osteomyelitis of the fourth metatarsal heads. She was admitted acutely ill with a temperature of 103. She was started on broad-spectrum vancomycin and cefepime. On 05/06/17  she was taken to the OR by [name] her podiatric surgeon for an incision and drainage irrigation of the left foot wound. Cultures from this surgery revealed group be strep and anaerobes. she was seen by Dr.Xu of orthopedic surgery and scheduled for a below-knee amputation which she u refused. Ultimately she was discharged on Levaquin and Flagyl for one month. MRI 05/05/17 done while she was in the hospital showed abscess adjacent to the fourth metatarsal head and neck small abscess around the fourth flexor tendon. Inflammatory phlegmon and gas in the soft tissues along the lateral aspect of the fourth phalanx. Findings worrisome for osteomyelitis involving the fourth proximal and middle phalanx and also the third and fourth metatarsals. Finally the patient had actually shortly  before this followed up with Dr. Raul Del at no time on 04/29/17. He felt that her left femoropopliteal bypass was patent he felt that her left-sided toe pressures more than adequate for healing a wound on the left foot. This was before her acute presentation. 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 05/28/17; she is started hyperbarics. The patient tells me that for some reason she was not actually on Levaquin but I think on ciprofloxacin. She was on Flagyl. She only started her Levaquin yesterday due to some difficulty with the pharmacy and perhaps her sister picking it up. She has an appointment with [name] tomorrow and with infectious disease early next week. She has no new complaints 06/06/17; the patient continues in hyperbarics. She saw [name] on 05/29/17 who is her podiatric surgeon. He is elected for a transmetatarsal amputation on 06/27/17. I'm not sure at what level he plans to do this amputation. The patient is unaware She also saw Dr. Megan Salon of infectious disease who elected to continue her on current antibiotics I think this is ciprofloxacin and Flagyl. I'll need to clarify with her tomorrow if she actually has this. We're using silver alginate to the actual wound. Necrotic surface today with material under the flap of her foot. Original MRI showed abscesses as well as osteomyelitis of the proximal and middle fourth phalanx and the third and fourth metatarsal heads 06/11/17; patient continues in hyperbarics and continues on oral antibiotics. She is doing well. The wound looks better. The necrotic part of this under the flap in her superior foot also looks better. she is been to see [name]. I haven't had a chance to look at his note. Apparently he has put the transmetatarsal amputation on hold her request it is still planning to take her to the OR for debridement and product application ACEL. I'll see if I can find his note. I'll therefore leave product ordering/requests to Dr.  [name] for now. I was going to look at Dermagraft 06/18/17-she is here in follow-up evaluation for bilateral foot wounds. She continues with hyperbaric therapy. She states she has been applying manuka honey to the right plantar foot and alternate manuka honey and silver alginate to the left foot, despite our orders. We will continue with same treatment plan and she will follo up next week. 06/25/17; I have reviewed [name] last note from 3/11. She has operative debridement in 2 days' time. By review his note apparently they're going to place there is skin over the majority of this wound which is a good choice. She has a small satellite area at the most proximal part of this wound on the left plantar foot. The area on the right plantar foot we've been using silver alginate and it is  close to healing. 07/02/17; unfortunately the patient was not easily approved for [name] proposed surgery. I'm not completely certain what the issue is. She has been using silver alginate to the wound she has completed a first course of hyperbarics. She is still on Levaquin and Flagyl. I have really lost track of the time course here.I suspect she should have another week to 2 of antibiotics. I'll need to see if she is followed up with infectious disease Dr. Megan Salon 07/09/17; the patient is followed up with Dr. Megan Salon. 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 She continues on Levaquin and metronidazole continuing both of these for now I think she is been on fr about 6 weeks. She still has some drainage but no pain. No fever. Her had been plans for her to go to the OR for operative debridement with her podiatrist [name], I am not exactly sure where that is. I'll probably slip a note to [name] today. I note that she follows with Dr. Dwyane Dee of endocrinology. We have her recertified for hyperbaric oxygen. I have not heard about Dermagraft however I'll see if [name] is  planning a skin substitute as well 07/16/17; the patient tells me she is just about out of [place]. I'll need to check Dr. Hale Bogus last notes on this. She states she has plenty of Flagyl however. She comes in today complaining of pain in the right lateral foot which she said lasted for about a day. The wound on the right foot is actually much more medially. She also tells me that the [facility] cost a lot of pain in the left foot wound and she turned back to silver alginate. Finally Dermagraft has a $914 per application co-pay. She cannot afford this 07/23/17; patient arrives today with the wound not much smaller. There is not much new to add. She has not heard from [name] all try to put in a call to them today. She was asking about Dermagraft again and she has an over $782 per application co-pay she states that she would be willing to try to do a payment plan. I been tried to avoid this. We've been using silver alginate, I'll change to [facility] 07/30/17-She is here in follow-up evaluation for left foot ulcer. She continues hyperbaric medicine. The left foot ulcer is stable we will continue with same treatment plan 08/06/17; she is here for evaluation of her left foot ulcer. Currently being treated for hyperbarics or underlying osteomyelitis. She is completed antibiotics. The left foot ulcer is better smaller with healthier looking granulation. For various reasons I am not really clear on we never got her back to the OR with [name]. He did not respond to my secure text message. Nevertheless I think that surgery on this point is not necessary nor am I completely clear that a skin substitute is necessary The patient is complaining about pain on the outside of her right foot. 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 There is no erythema. She also states the foot is warm versus her other part of her upper leg and this is largely true. It is not totally clear to me what's  causing this. She thinks it's different from her usual neuropathy pain 08/13/17; she arrives in clinic today with a small wound which is superficial on her right first metatarsal head. 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 She tells [place] she was up on  her feet over the Mother's Day celebration. The large wound is on the left foot. Continues with hyperbarics for underlying osteomyelitis. We're using Hydrofera Blue. She asked me today about where we were with Dermagraft. I had actually excluded this because of the co- pay however she wants to assume this therefore I'll recheck the co-pay an order for next week. 08/20/17; the patient agreed to accept the co-pay of the first Apligraf which we applied today. She is disappointed she is finishing hyperbarics will run this through the insurance on the extent of the foot infection and the extent of the wound that she had however she is already had 60 dive's. Dermagraft No. 1 08/27/17; Dermagraft No. 2. She is not eligible for any more hyperbaric treatments this month. She reports a fair amount of drainage and she actually changed to the external dressings without disturbing the direct contact layer 09/03/17; the patient arrived in clinic today with the wound superficially looking quite healthy. Nice vibrant red tissue with some advancing epithelialization although not as much adherence of the flap as I might like. However she noted on her own fourth toe some bogginess and she brought that to our attention. Indeed this was boggy feeling like a possibility of subcutaneous fluid. 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 She is not been unwell. We've been using Dermagraft 09/10/17; the culture that I did not last week was MRSA. She saw Dr. Megan Salon this morning who is going to start her on vancomycin. I had sent him a secure a text message yesterday. I also spoke with  her podiatric surgeon [name] about surgery on this foot the options for conserving a functional foot etc. Promised me he would see her and will make back consultation today. Paradoxically her actual wound on the plantar aspect of her left foot looks really quite good. I had given her 5 days worth of Baxdella to cover her for MRSA. Her MRI came back showing osteomyelitis within the third metatarsal shaft and head and base of the third and fourth proximal phalanx. She had extensive inflammatory changes throughout the soft tissue of the lateral forefoot. With an ill-defined fluid around the fourth metatarsal extending into the plantar and dorsal soft tissues 09/19/17; the patient is actually on oral Septra and Flagyl. She apparently refused IV vancomycin. She also saw [name] at my request who is planning her for a left BKA sometime in mid July. MRI showed osteomyelitis within the third metatarsal shaft and head and the basis of the third and fourth proximal phalanx. I believe there was felt to be possible septic arthritis involving the third MTP. 09/26/17; the patient went back to Dr. Megan Salon at my suggestion and is now receiving IV daptomycin. Her wound continues to look quite good making the decision to proceed with a transmetatarsal amputation although more difficult for the patient. I believe in my extensive discussions with her she has a good sense of the pros and cons of this. I don't NV the tuft decision she has to make. She has an appointment with [name] I believe in mid July and I previously spoken to him about this issue Has we had used 3 previous Dermagraft. Given the condition of the wound surface I went ahead and added the fourth one today area and I did this not fully realizing that she'll be traveling to [place] next week. I'm hopeful she can come  back in 2 weeks 10/21/17; Her same Dermagraft on for about 3-1/2 weeks. In spite of this the wound arrives looking quite  healthy. There is been a lot of healing dimensions are smaller. Looking at the square shaped wound she has now there is some undermining and some depth medially under the undermining although I cannot palpate any bone. No surrounding infection is obvious. She has difficult questions about how to look at this going forward vis--vis amputations versus continued medical therapy. To be truthful the wound is looks so healthy and it is continued to contract. Hard to justify foot surgery at this point although I still told her that I think it might come to that if we are not able to eradicate the underlying MRSA. She is still highly at risk and she understands this 11/06/17 on evaluation today patient appears to be doing better in regard to her foot ulcer. She's been tolerating the dressing changes without complication. Currently she is here for her Dermagraft #6. Her wound continues to make excellent progress at this point. She does not appear to have any evidence of infection which is good news. 11/13/17 on evaluation today patient appears to be doing excellent at this time. She is here for repeat Dermagraft application. This is #7. Overall her wound seems to be making great progress. 12/05/17; the patient arrives with the wound in much better condition than when I last saw this almost 6 weeks ago. She still has a small probing area in the left metatarsal head region on the lateral aspect of her foot. We applied her last Dermagraft today. Since the last time she is here she has what appears to a been a blood blister on the plantar aspect of left foot although I don't see this is threatening. There is also a thick raised tissue on the right mid metatarsal head region. This was not there I don't think the last time she was here 3 weeks ago. 12/12/17; the patient continues to have a small programming area in the left metatarsal head region on the lateral aspect of her foot which was the initial large surgical  wound. I applied her last Apligraf last week. I'm going to use Endoform starting today Unfortunately she has an excoriated area in the left mid foot and the right mid foot. The left midfoot looks like a blistered area this was not opened last week it certainly is open today. Using silver alginate on these areas. She promises me she is offloading this. 12/19/17; the small probing area in the left metatarsal head eyes think is shallower. In general her original wound looks better. We've been using Endoform. The area inferiorly that I think was trauma last week still requires debridement a lot of nonviable surface which I removed. She still has an open open area distally in her foot Similarly on the right foot there is tightly adherent surface debris which I removed. Still areas that don't look completely epithelialized. This is a small open area. We used silver alginate on these areas 12/26/2017; the patient did not have the supplies we ordered from last week including the Endoform. The original large wound on the left lateral foot looks healthy. She still has the undermining area that is largely unchanged from last week. She has the same heavily callused raised edged wounds on the right mid and left midfoot. Both of these requiring debridement. We have been using silver alginate on these areas 01/02/2018; there is still supply issues. We are going to try  to use Prisma but I am not sure she actually got it from what she is saying. She has a new open area on the lateral aspect of the left fourth toe [previous fifth ray amputation]. Still the one tunneling area over the fourth metatarsal head. The area is in the midfoot bilaterally still have thick callus around them. She is concerned about a raised swelling on the lateral aspect of the foot. However she is completely insensate 01/10/2018; we are using Prisma to the wounds on her bilateral feet. Surprisingly the tunneling area over the left fourth  metatarsal head that was part of her original surgery has closed down. She has a small open area remaining on the incision line. 2 open areas in the midfoot. 02/10/2018; the patient arrives back in clinic after a month hiatus. She was traveling to visit family in [place]. Is fairly clear she was not offloading the areas on her feet. The original wound over the left lateral foot at the level of metatarsal heads is reopened and probes medially by about a centimeter or 2. She notes that a week ago she had purulent drainage come out of an area on the left midfoot. Paradoxically the worst area is actually on the right foot is extensive with purulent drainage. We will use silver alginate today 02/17/2018; the patient has 3 wounds one over the left lateral foot. She still has a small area over the metatarsal heads which is the remnant of her original surgical wound. This has medial probing depth of roughly 1.4 cm somewhat better than last week. The area on the right foot is larger. We have been using silver alginate to all areas. The area on the right foot and left foot that we cultured last week showed both Klebsiella and Proteus. Both of these are quinolone sensitive. The patient put her's self on Bactrim and Flagyl that she had left hanging around from prior antibiotic usages. She was apparently on this last week when she arrived. I did not realize this. Unfortunately the Bactrim will not cover either 1 of these organisms. We will send in Cipro 500 twice daily for a week 03/04/2018; the patient has 2 wounds on the left foot one is the original wound which was a surgical wound for a deep DFU. At one point this had exposed bone. She still has an area over the fourth metatarsal head that probes about 1.4 cm although I think this is better than last week. I been using silver nitrate to try and promote tissue adherence and been using silver alginate here. She also has an area in the left midfoot. This has  some depth but a small linear wound. Still requiring debridement. On the right midfoot is a circular wound. A lot of thick callus around this area. We have been using silver alginate to all wound areas She is completed the ciprofloxacin I gave her 2 weeks ago. 03/11/2018; the patient continues to have 2 open areas on the left foot 1 of which was the original surgical wound for a deep DFU. Only a small probing area remains although this is not much different from last week we have been using silver alginate. The other area is on the midfoot this is smaller linear but still with some depth. We have been using silver alginate here as well On the right foot she has a small circular wound in the mid aspect. This is not much smaller than last time. We have been using silver alginate here as well  03/18/2018; she has 3 wounds on the left foot the original surgical wound, a very superficial wound in the mid aspect and then finally the area in the mid plantar foot. She arrives in today with a very concerning area in the wound in the mid plantar foot which is her most proximal wound. There is undermining here of roughly 1-1/2 cm superiorly. Serosanguineous drainage. She tells me she had some pain on for over the weekend that shot up her foot into her thigh and she tells me that she had a nodule in the groin area. She has the single wound in the right foot. We are using endoform to both wound areas 03/24/2018; the patient arrives with the original surgical wound in the area on the left midfoot about the same as last week. There is a collection of fluid under the surface of the skin extending from the surgical wound towards the midfoot although it does not reach the midfoot wound. The area on the right foot is about the same. Cultures from last week of the left midfoot wound showed abundant Klebsiella abundant Enterococcus faecalis and moderate methicillin resistant staph I gave her Levaquin but this would  have only covered the Klebsiella. She will need linezolid 04/01/2018; she is taking linezolid but for the first few days only took 1 a day. I have advised her to finish this at twice daily dosing. In any case all of her wounds are a lot better especially on the left foot. The original surgical wound is closed. The area on the left midfoot considerably smaller. The area on the right foot also smaller. 04/08/2018; her original surgical wound/osteomyelitis on the left foot remains closed. She has area on the left foot that is in the midfoot area but she had some streaking towards this. This is not connected with her original wound at least not visually. Small wound on the right midfoot appears somewhat smaller. 04/15/18; both wounds looks better. Original wound is better left midfoot. Using silver alginate 1/21; patient states she uses saltwater soak in, stones or remove callus from around her wounds. She is also concerned about a blood blister she had on the left foot but it simply resolved on its own. We've been using silver alginate 1/28; the patient arrives today with the same streaking area from her metatarsals laterally [the site of her original surgical wound] down to the middle of her foot. There is some drainage in the subcutaneous area here. This concerns me that there is actually continued ongoing infection in the metatarsals probably the fourth and third. This fixates an MRI of the foot without contrast [chronic renal failure] The wound in the mid part of the foot is small but I wonder whether this area actually connects with the more distal foot. The area on the right midfoot is probably about the same. Callus thick skin around the small wound which I removed with a curette we have been using silver alginate on both wound areas 2/4; culture I did of the draining site on the left foot last time grew methicillin sensitive staph aureus. MRI of the left foot showed interval resolution of the  findings surrounding the third metatarsal joint on the prior study consistent with treated osteomyelitis. Chronic soft tissue ulceration in the plantar and lateral aspect of the forefoot without residual focal fluid collection. No evidence of recurrent osteomyelitis. Noted to have the previous amputation of the distal first phalanx and fifth ray MRI of the right foot showed no evidence of  osteomyelitis I am going to treat the patient with a prolonged course of antibiotics directed against MSSA in the left foot 2/11; patient continues on cephalexin. She tells me she had nausea and vomiting over the weekend and missed 2 days. In general her foot looks much the same. She has a small open area just below the left fourth metatarsal head. A linear area in the left midfoot. Some discoloration extending from the inferior part of this into the left lateral foot although this appears to be superficial. She has a small area on the right midfoot which generally looks smaller after debridement 2/18; the patient is completing his cephalexin and has another 2 days. She continues to have open areas on the left and right foot. 2/25; she is now off antibiotics. The area on the left foot at the site of her original surgical wound has closed yet again. She still has open areas in the mid part of her foot however these appear smaller. The area on the right mid foot looks about the same. We have been using silver alginate She tells me she had a serious hypoglycemic spell at home. She had to have EMS called and get IV dextrose 3/3; disappointing on the left lateral foot large area of necrotic tissue surrounding the linear area. This appears to track up towards the same original surgical wound. Required extensive debridement. The area on the right plantar foot is not a lot better also using silver 3/12; the culture I did last time showed abundant enterococcus. I have prescribed Augmentin, should cover any unrecognized  anaerobes as well. In addition there were a few MRSA and Serratia that would not be well covered although I did not want to give her multiple antibiotics. She comes in today with a new wound in the right midfoot this is not connected with the original wound over her MTP a lot of thick callus tissue around both wounds but once again she said she is not walking on these areas 3/17-Patient comes in for follow-up on the bilateral plantar wounds, the right midfoot and the left plantar wound. Both these are heavily callused surrounding the wounds. We are continuing to use silver alginate, she is compliant with offloading and states she uses a wheelchair fairly often at home 3/24; both wound areas have thick callus. However things actually look quite a bit better here for the majority of her left foot and the right foot. 3/31; patient continues to have thick callused somewhat irritated looking tissue around the wounds which individually are fairly superficial. There is no evidence of surrounding infection. We have been using silver alginate however I change that to [facility] today 4/17; patient returns to clinic after having a scare with Covid she tested negative in her primary doctor's office. She has been using Hydrofera Blue. She does not have an open area on the right foot. On the left foot she has a small open area with the mid area not completely viable. She showed me pictures of what looks like a hemorrhagic blister from several days ago but that seems to have healed over this was on the lateral left foot 4/21; patient comes in to clinic with both her wounds on her feet closed. However over the weekend she started having pain in her right foot and leg up into the thigh. She felt as though she was running a low-grade fever but did not take her temperature. She took a doxycycline that she had leftover and yesterday a single Septra  and metronidazole. She thinks things feel somewhat  better. 4/28; duplex ultrasound I ordered last week was negative for DVT or superficial thrombophlebitis. She is completed the doxycycline I gave her. States she is still having a lot of pain in the right calf and right ankle which is no better than last week. She cannot sleep. She also states she has a temperature of up to 101, coughing and complaining of visual loss in her bilateral eyes. Apparently she was tested for Covid 2 weeks ago at [facility] and that was negative. Readmission: 09/03/18 patient presents back for reevaluation after having been evaluated at the end of April regarding erythema and swelling of her right lower extremity. Subsequently she ended up going to the hospital on 07/29/18 and was admitted not to be discharged until 08/08/18. Unfortunately it was noted during the time that she was in the hospital that she did have methicillin-resistant Staphylococcus aureus as the infection noted at the site. It was also determined that she did have osteomyelitis which appears to be fairly significant. She was treated with vancomycin and in fact is still on IV vancomycin at dialysis currently. This is actually slated to continue until 09/12/18 at least which will be the completion of the six weeks of therapy. Nonetheless based on what I'm seeing at this point I'm not sure she will be anywhere near ready to discontinue antibiotics at that time. Since she was released from the hospital she was seen by [name] who is her podiatrist on 08/27/18. His note specifically states that he is recommended that the patient needs of one knee amputation on the right as she has a life-threatening situation that can lead quickly to sepsis. The patient advised she would like to try to save her leg to which [name] apparently told her that this was against all medical advice. She also want to discontinue the Wound VAC which had been initiated due to the fact that she wasn't pleased with how the wound was looking  and subsequently she wanted to pursue applying Medihoney at that time. He stated that he did not believe that the right lower extremity was salvageable and that the patient understood but would still like to attempt hyperbaric option therapy if it could be of any benefit. She was therefore referred back to [place] for further evaluation. He plans to see her back next week. Upon inspection today patient has a significant amount purulent drainage noted from the wound at this point. The bone in the distal portion of her foot also appears to be extremely necrotic and spongy. When I push down on the bone it bubbles and seeps purulent drainage from deeper in the end of the foot. I do not think that this is likely going to heal very well at all and less aggressive surgical debridement were undertaken more than what I believe we can likely do here in our office. 09/12/2018; I have not seen this patient since the most recent hospitalization although she was in our clinic last week. I have reviewed some of her records from a complex hospitalization. 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 There is situation was complicated by MRSA bacteremia and acute on chronic renal failure now on dialysis. She is receiving vancomycin at dialysis. We started her on Dakin's wet-to- dry last week she is changing this daily. There is still purulent drainage coming out of her foot. Although she is apparently "agreeable" to a below-knee amputation which  is been suggested by multiple clinicians she wants this to be done in [place]. She apparently has a telehealth visit with that provider sometime in late [place] 6/24. I have told her I think this is probably too long. Nevertheless I could not convince her to allow a local doctor to perform BKA. 09/19/2018; the patient has a large necrotic area on the right anterior foot. She has had previous transmetatarsal amputations. Culture I did  last week showed MRSA nothing else she is on vancomycin at dialysis. She has continued leaking purulent drainage out of the distal part of the large circular wound on the right anterior foot. She apparently went to see Dr. 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 Somehow that translated into her being referred to plastic surgery for debridement of the area. I gather she basically refused amputation although I do not have a copy of Dr. Berenice Primas notes. The patient really wants to have a trial of hyperbaric oxygen. I agreed with initial assessment in this clinic that this was probably too far along to benefit however if she is going to have plastic surgery I think she would benefit from ancillary hyperbaric oxygen. The issue here is that the patient has benefited as maximally as any patient I have ever seen from hyperbaric oxygen therapy. Most recently she had exposed bone on the lateral part of her left foot after a surgical procedure and that actually has closed. She has eschared areas in both heels but no open area. She is remained systemically well. I am not optimistic that anything can be done about this but the patient is very clear that she wants an attempt. The attempt would include a wound VAC further debridements and hyperbaric oxygen along with IV antibiotics. 6/26; I put her in for a trial of hyperbaric oxygen only because of the dramatic response she has had with wounds on her left midfoot earlier this year which was a surgical wound that went straight to her bone over the metatarsal heads and also remotely the left third toe. We will see if we can get this through our review process and insurance. She arrives in clinic with again purulent material pouring out of necrotic bone on the top of the foot distally. There is also some concerning erythema on the front of the leg that we marked. It is bit difficult to tell how tender this is because of neuropathy. I  note from infectious disease that she had her vancomycin extended. 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 She had the wound VAC on for part of the week. The rest of the time she is putting various things on this including Medihoney, "ionized water" silver sorb gel etc. 7/7; follow-up along with HBO. She is still on vancomycin at dialysis. She has a large open area on the dorsal right foot and a small dark eschar area on her heel. There is a lot less erythema in the area and a lot less tenderness. From an infection point of view I think this is better. She still has a lot of necrosis in the remaining right forefoot [previous TMA] we are still using the wound VAC in this area 7/16; follow-up along with HBO. I put her on linezolid after she finished her vancomycin. We started this last Friday I gave her 2 weeks worth. I had the expectation that she would be operatively debrided by Dr. Marla Roe but that still has not happened yet. Patient phoned the  office this week. She arrives for review today after HBO. The distal part of this wound is completely necrotic. Nonviable pieces of tendon bone was still purulent drainage. Also concerning that she has black eschar over the heel that is expanding. I think this may be indicative of infection in this area as well. She has less erythema and warmth in the ankle and calf but still an abnormal exam 7/21 follow-up along with HBO. I will renew her linezolid after checking a CBC with differential monitoring her blood counts especially her platelets. She was supposed to have surgery yesterday but if I am reading things correctly this was canceled after her blood sugar was found to be over 500. I thought Dr. Marla Roe who called me said that they were sending her to the ER but the patient states that was not the case. 7/28. Follow-up along with HBO. She is on linezolid I still do not have any lab work from dialysis even though  I called last week. The patient is concerned about an area on her left lateral foot about the level of the base of her fifth metatarsal. I did not really see anything that ominous here however this patient is in [place] ability to point out problems that she is sensing and she has been accurate in the past Finally she received a call from Dr. Marla Roe who is referring her to another orthopedic surgeon stating that she is too booked up to take her to the operating room now. Was still using a wound VAC on the foot 8/3 -Follow-up after HBO, she is got another week of linezolid, she is to call ID for an appointment, x-rays of both feet were reviewed, the left foot x-ray with third MTP joint osteo- Right foot x-ray widespread osteo-in the right midfoot Right ankle x-ray does not show any active evidence of infection 8/11-Patient is seen after HBO, the wounds on the right foot appear to be about the same, the heel wound had some necrotic base over tendon that was debrided with a curette 8/21; patient is seen after HBO. The patient's wound on her dorsal foot actually looks reasonably good and there is substantial amount of epithelialization however the open area distally still has a lot of necrotic debris partially bone. I cannot really get a good sense of just how deep this probes under the foot. She has been pressuring me this week to order medical maggots through a company in [place] for her. The problem I have is there is not a defined wound area here. On the positive side there is no purulence. She has been to see infectious disease she is still on Septra DS although I have not had a chance to review their notes 8/28; patient is seen in conjunction with HBO. The wounds on her foot continued to improve including the right dorsal foot substantially the, the distal part of this wound and the area on the right heel. We have been using a wound VAC over this chronically. She is still on trimethoprim  as directed by infectious disease 9/4; patient is seen in conjunction with HBO. Right dorsal foot wound substantially anteriorly is better however she continues to have a deep wound in the distal part of this that is not responding. We have been using silver collagen under border foam Area on the right plantar medial heel seems better. We have been using Hydrofera Blue 12/12/18 on evaluation today patient appears to be doing about the same with regard to her wound based  on prior measurements. She does have some necrotic tissue noted on the lateral aspect of the wound that is going require a little bit of sharp debridement today. This includes what appears to be potentially either severely necrotic bone or tendon. Nonetheless other than that she does not appear to have any severe infection which is good news 9/18; it is been 2 weeks since I saw this wound. She is tolerating HBO well. Continued dramatic improvement in the area on the right dorsal foot. She still has a small wound on the heel that we have been using Hydrofera Blue. She continues with a wound VAC 9/24; patient has to be seen emergently today with a swelling on her right lateral lower leg. She says that she told Dr. Evette Doffing about this and also myself on a couple of occasions but I really have no recollection of this. She is not systemically unwell and her wound really looked good the last time I saw this. She showed this to providers at dialysis and she was able to verify that she was started on cephalexin today for 5 doses at dialysis. She dialyzes on Tuesday Thursday and Saturday. 10/2; patient is seen in conjunction with HBO. The area that is draining on the right anterior medial tibia is more extensive. Copious amounts of serosanguineous drainage with some purulence. We are still using the wound VAC on the original wound then it is stable. Culture I did of the original IandD showed MRSA I contacted dialysis she is now on vancomycin  with dialysis treatments. I asked them to run a month 10/9; patient seen in conjunction with HBO. She had a new spontaneous open area just above the wound on the right medial tibia ankle. More swelling on the right medial tibia. Her wound on the foot looks about the same perhaps slightly better. There is no warmth spreading up her leg but no obvious erythema. her MRI of the foot and ankle and distal tib-fib is not booked for next Friday I discussed this with her in great detail over multiple days. it is likely she has spreading infection upper leg at least involving the distal 25% above the ankle. She knows that if I refer her to orthopedics for infectious disease they are going to recommend amputation and indeed I am not against this myself. We had a good trial at trying to heal the foot which is what she wanted along with antibiotics debridement and HBO however she clearly has spreading infection [probably staph aureus/MRSA]. Nevertheless she once again tells me she wants to wait the left of the MRI. She still makes comments about having her amputation done in [place]. 10/19; arrives today with significant swelling on the lateral right leg. Last culture I did showed Klebsiella. Multidrug- resistant. Cipro was intermediate sensitivity and that is what I have her on pending her MRI which apparently is going to be done on Thursday this week although this seems to be moving back and forth. She is not systemically unwell. We are using silver alginate on her major wound area on the right medial foot and the draining areas on the right lateral lower leg 10/26; MRI showed extensive abscess in the anterior compartment of the right leg also widespread osteomyelitis involving osseous structures of the midfoot and portions of the hindfoot. Also suspicion for osteomyelitis anterior aspect of the distal medial malleolus. Culture I did of the purulence once again showed a multidrug-resistant Klebsiella. I  have been in contact with nephrology late last week and  she has been started on cefepime at dialysis to replace the vancomycin We sent a copy of her MRI report to Dr. Geroge Baseman in [place] who is an orthopedic surgeon. The patient takes great stock in his opinion on this. She says she will go to [place] to have her leg amputated if Dr. Geroge Baseman does not feel there is any salvage options. 11/2; she still is not talk to her orthopedic surgeon in [place]. Apparently he will call her at [phone] this afternoon. The quality of this is she has not allowed me to refer her anywhere. She has been told over and over that she needs this amputated but has not agreed to be referred. She tells me her blood sugar was 600 last night but she has not been febrile. 11/9; she never did got a call from the orthopedic surgeon in [place] therefore that is off the radar. We have arranged to get her see orthopedic surgery at [facility]. She still has a lot of draining purulence coming out of the new abscess in her right leg although that probably came from the osteomyelitis in her right foot and heel. Meanwhile the original wound on the right foot looks very healthy. Continued improvement. The issue is that the last MRI showed osteomyelitis in her right foot extensively she now has an abscess in the right anterior lower leg. There is nobody in [place] who will offer this woman anything but an amputation and to be honest that is probably what she needs. I think she still wants to talk about limb salvage although at this point I just do not see that. She has completed her vancomycin at dialysis which was for the original staph aureus she is still on cefepime for the more recent Klebsiella. She has had a long course of both of these antibiotics which should have benefited the osteomyelitis on the right foot as well as the abscess. 11/16; apparently [place] elective surgery is shut down because of COVID-19 pandemic.  I have reached out to some contacts at [facility] to see if we can get her an orthopedic appointment there. I am concerned about continually leaving this but for the moment everything is static. In fact her original large wound on this foot is closing down. It is the abscess on the right anterior leg that continues to drain purulent serosanguineous material. She is not currently on any antibiotics however she had a prolonged course of vancomycin [1 month] as well as cefepime for a month 02/24/2019 on evaluation today patient appears to be doing better than the last time I saw her. This is not a patient that I typically see. With that being said I am covering for Dr. Dellia Nims this week and again compared to when I last saw her overall the wounds in particular seem to be doing significantly better which is good news. With that being said the patient tells me several disconcerting things. She has not been able to get in to see anyone for potential debridement in regard to her leg wounds although she tells me that she does not think it is necessary any longer because she is taking care of that herself. She noticed a string coming out of the lower wound on her leg over the last week. The patient states that she subsequently decided that we must of pack something in there and started pulling the string out and as it kept coming and coming she realized this was likely her tendon. With that being said she continued to remove  as much of this as she could. She then I subsequently proceeded to using tubes of antibiotic ointment which she will stick down into the wound and then scored as much as she can until she sees it coming out of the other wound opening. She states that in doing this she is actually made things better and there is less redness and irritation. With regard to her foot wound she does have some necrotic tendon and tissue noted in one small corner but again the actual wound itself seems to be doing  better with good granulation in general compared to my last evaluation. 12/7; continued improvement in the wound on the substantial part of the right medial foot. Still a necrotic area inferiorly that required debridement but the rest of this looks very healthy and is contracting. She has 2 wounds on the right lateral leg which were her original drainage sites from her abscess but all of this looks a lot better as well. She has been using silver alginate after putting antibiotic biotic ointment in one wound and watching it come out the other. I have talked to her in some detail today. I had given her names of orthopedic surgeons at [facility] for second opinion on what to do about the right leg. I do not think the patient never called them. She has not been able to get a hold of the orthopedic surgeon in [place] that she had put a lot of faith in as being somebody would give her an opinion that she would trust. I talked to her today and said even if I could get her in to another orthopedic surgeon about the leg which she accept an amputation and she said she would not therefore I am not going to press this issue for the moment 12/14; continued improvement in his substantial wound on the right medial foot. There is still a necrotic area inferiorly with tightly adherent necrotic debris which I have been working on debriding each time she is here. She does not have an orthopedic appointment. Since last time she was here I looked over her cultures which were essentially MRSA on the foot wound and gram- negative rods in the abscess on the anterior leg. 12/21; continued improvement in the area on the right medial foot. She is not up on this much and that is probably a good thing since I do not know it could support continuous ambulation. She has a small area on the right lateral leg which were remanence of the IandD's I did because of the abscess. I think she should probably have  prophylactic antibiotics I am going to have to look this over to see if we can make an intelligent decision here. In the meantime her major wound is come down nicely. Necrotic area inferiorly is still there but looks a lot better 04/06/2019; she has had some improvement in the overall surface area on the right medial foot somewhat narrowedr both but somewhat longer. The areas on the right lateral leg which were initial IandD sites are superficial. Nothing is present on the right heel. We are using silver alginate to the wound areas 1/18; right medial foot somewhat smaller. Still a deep probing area in the most distal recess of the wound. She has nothing open on the right leg. She has a new wound on the plantar aspect of her left fourth toe which may have come from just pulling skin. The patient using Medihoney on the wound on her foot under silver alginate.  I cannot discourage her from this 2/1; 2-week follow-up using silver alginate on the right foot and her left fourth toe. The area on the right dorsal foot is contracted although there is still the deep area in the most distal part of the wound but still has some probing depth. No overt infection 2/15; 2-week follow-up. She continues to have improvement in the surface area on the dorsal right foot. Even the tunneling area from last time is almost closed. The area that was on the plantar part of her left fourth toe over the PIP is indeed closed Electronic Signature(s) Signed: 05/18/2019 6:16:05 PM By: Linton Ham MD Entered By: Linton Ham on 05/18/2019 14:35:23 -------------------------------------------------------------------------------- Physical Exam Details Patient Name: Date of Service: [name] 05/18/2019 1:00 PM Medical Record Number:[id number] Patient Account Number: [id number] Date of Birth/Sex: Treating RN: Mar 20, 1971 (49 y.o. Nancy Fetter Primary Care Provider: [name] Other Clinician: Referring  Provider: Treating Provider/Extender:Seryna Marek, [name], [name] in Treatment: 39 Constitutional Patient is hypertensive.. Pulse regular and within target range for patient.Marland Kitchen Respirations regular, non-labored and within target range.. Temperature is normal and within the target range for the patient.Marland Kitchen Appears in no distress. Cardiovascular Her pulses are palpable. Abscess area on the right anterior lower leg resolved. Notes Wound exam; continued improvement in the surface of the wound of the dorsal right foot. This is come in nicely even the depth part of this wound distally is closing down. There is no need for debridement. The area on the plantar left fourth toe over the PIP is indeed closed Electronic Signature(s) Signed: 05/18/2019 6:16:05 PM By: Linton Ham MD Entered By: Linton Ham on 05/18/2019 14:36:39 -------------------------------------------------------------------------------- Physician Orders Details Patient Name: Date of Service: [name] 05/18/2019 1:00 PM Medical Record Number:[id number] Patient Account Number: [id number] Date of Birth/Sex: Treating RN: July 23, 1970 (50 y.o. Nancy Fetter Primary Care Provider: Other Clinician: [name] Referring Provider: Treating Provider/Extender:Patrica Mendell, [name], [name] in Treatment: 43 Verbal / Phone Orders: No Diagnosis Coding ICD-10 Coding Code Description M86.671 Other chronic osteomyelitis, right ankle and foot L97.514 Non-pressure chronic ulcer of other part of right foot with necrosis of bone E10.621 Type 1 diabetes mellitus with foot ulcer L97.521 Non-pressure chronic ulcer of other part of left foot limited to breakdown of skin Follow-up Appointments Return Appointment in 2 weeks. Dressing Change Frequency Wound #43 Right,Medial Foot Change dressing three times week. Wound Cleansing Clean wound with Wound Cleanser - Anasept to all wounds Primary Wound  Dressing Wound #43 Right,Medial Foot Calcium Alginate with Silver - lightly pack Other: - may use thin layer of medihoney under alginate Secondary Dressing Wound #43 Right,Medial Foot Kerlix/Rolled [name] ABD pad Edema Control Elevate legs to the level of the heart or above for 30 minutes daily and/or when sitting, a frequency of: - throughout the day [facility] for wound care. - Interim Electronic Signature(s) Signed: 05/18/2019 6:14:31 PM By: [name] RN, BSN Signed: 05/18/2019 6:16:05 PM By: Linton Ham MD Entered By: [name] on 05/18/2019 14:10:35 -------------------------------------------------------------------------------- Problem List Details Patient Name: Date of Service: [name] 05/18/2019 1:00 PM Medical Record Number:[id number] Patient Account Number: [id number] Date of Birth/Sex: Treating RN: 1970/09/01 (49 y.o. Nancy Fetter Primary Care Provider: [name] Other Clinician: Referring Provider: Treating Provider/Extender:Prisma Decarlo, [name], [name] in Treatment: 66 Active Problems ICD-10 Evaluated Encounter Code Description Active Date Today Diagnosis M86.671 Other chronic osteomyelitis, right ankle and foot 09/03/2018 No Yes L97.514 Non-pressure chronic ulcer  of other part of right foot 09/03/2018 No Yes with necrosis of bone E10.621 Type 1 diabetes mellitus with foot ulcer 09/24/2018 No Yes L97.521 Non-pressure chronic ulcer of other part of left foot 04/20/2019 No Yes limited to breakdown of skin Inactive Problems ICD-10 Code Description Active Date Inactive Date L97.812 Non-pressure chronic ulcer of other part of right lower leg with 02/24/2019 02/24/2019 fat layer exposed Resolved Problems ICD-10 Code Description Active Date Resolved Date L02.415 Cutaneous abscess of right lower limb 12/25/2018 12/25/2018 Electronic Signature(s) Signed: 05/18/2019 6:16:05 PM By: Linton Ham  MD Entered By: Linton Ham on 05/18/2019 14:34:25 -------------------------------------------------------------------------------- Progress Note Details Patient Name: Date of Service: [name] 05/18/2019 1:00 PM Medical Record Number:[id number] Patient Account Number: [id number] Date of Birth/Sex: Treating RN: 1971/02/29 (49 y.o. Nancy Fetter Primary Care Provider: [name] Other Clinician: Referring Provider: Treating Provider/Extender:Tiauna Whisnant, [name], [name] in Treatment: 36 Subjective History of Present Illness (HPI) 49 year old diabetic who is known to have type 1 diabetes which is poorly controlled last hemoglobin A1c was 11%. She comes in with a ulcerated area on the left lateral foot which has been there for over 6 months. Was recently she has been treated by [name] of podiatry who saw her last on 05/28/2016. Review of his notes revealed that the patient had incision and drainage with placement of antibiotic beads to the left foot on 04/11/2016 for possible osteomyelitis of the cuboid bone. Over the last year she's had a history of amputation of the left fifth toe and a femoropopliteal popliteal bypass graft somewhere in April 2017. 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 His note [name] mentions that the patient has been referred to me for further wound care and possibly great candidate for hyperbaric oxygen therapy due to recurrent osteomyelitis. However we do not have any x-rays of biopsy reports confirming this. He has been on several antibiotics including Bactrim and most recently is on doxycycline for an MRSA. I understand, the patient was not a candidate for IV antibiotics as she has had previous PICC lines which resulted in blood clots in both arms. There was a x-ray report dated 04/04/2016 on [name] notes which showed evidence of fifth ray resection left foot with osteolytic changes noted to the fourth  metatarsal and cuboid bone on the left. 06/13/2016 -- had a left foot x-ray which showed no acute fracture or dislocation and no definite radiographic evidence of osteomyelitis. Advanced osteopenia was seen. 06/20/2016 -- she has noticed a new wound on the right plantar foot in the region where she had a callus before. 06/27/16- the patient did have her x-ray of the right foot which showed no findings to suggest osteomyelitis. She saw her endocrinologist, [name], yesterday. Her A1c in January was 11. He also indicates mismanagement and noncompliance regarding her diabetes. She is currently on Bactrim for a lip infection. She is complaining of nausea, vomiting and diarrhea. She is unable to articulate the exact orders or dosing of the Bactrim; it is unclear when she will complete this. 07/04/2016 -- results from [facility] of ABIs with ankle waveforms were noted from 02/14/2016. The examination done on 06/27/2015 showed noncompressible ABIs with the right being 1.45 and the left being 1.33. The present examination showed a right ABI of 1.19 on the left of 1.33. The conclusion was that right normal ABI in the lower extremity at rest however compared to previous study which was noncompressible ABI may be falsely elevated side suggesting medial  calcification. The left ABI suggested medial calcification. 08/01/2016 -- the patient had more redness and pain on her right foot and did not get to come to see as noted she see her PCP or go to the ER and decided to take some leftover metronidazole which she had at home. As usual, the patient does report she feels and is rather noncompliant. 08/08/2016 -- -- x-ray of the right foot -- FINDINGS:Transmetatarsal amputation is noted. No bony destruction is noted to suggest osteomyelitis. IMPRESSION: No evidence of osteomyelitis. Postsurgical changes are seen. MRI would be more sensitive for possible bony changes. Culture has grown Serratia Marcescens --  sensitive to Bactrim, ciprofloxacin, ceftazidime she was seen by [name] on 08/06/2016. He did not find any exposed bone, muscle, tendon, ligament or joint. There was no malodor and he did a excisional debridement in the office. ============ Old notes: 49 year old patient who is known to the wound clinic for a while had been away from the wound clinic since 09/01/2014. Over the last several months she has been admitted to various hospitals including [facility]. She was treated for a right metatarsal osteomyelitis with a transmetatarsal amputation and this was done about 2 months ago. He has a small ulcerated area on the right heel and she continues to have an ulcerated area on the left plantar aspect of the foot. The patient was recently admitted to the [facility] hospital group between 7/12 and 10/18/2014. she was given 3 weeks of IV vancomycin and was to follow-up with her surgeons at [facility] and also took oral vancomycin for C. difficile colitis. 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 MRI of the left foot done on 10/14/2014 showed no abscess or osteomyelitis. 04/27/15; this is a patient we know from previous stays in the wound care center. She is a type I diabetic I am not sure of her control currently. Since the last time I saw her she is had a right transmetatarsal amputation and has no wounds on her right foot and has no open wounds. She is been followed at the wound care center at [facility] in [place]. She comes today with the desire to undergo hyperbaric treatment locally. Apparently one of her wound care providers in [place] has suggested hyperbarics. This is in response  to an MRI from 04/18/15 that showed increased marrow signal and loss of the proximal fifth metatarsal cortex evidence of osteomyelitis with likely early osteomyelitis in the cuboid bone as well. She has a large wound over the base of the fifth metatarsal. She also has a eschar over her the tips of her toes on 1,3 and 5. She does not have peripheral pulses and apparently is going for an angiogram tomorrow which seems reasonable. After this she is going to infectious disease at [facility]. They have been using Medihoney to the large wound on the lateral aspect of the left foot to. The patient has known Charcot deformity from diabetic neuropathy. She also has known diabetic PAD. Surprisingly I can't see that she has had any recent antibiotics, the patient states the last antibiotic she had was at the end of November for 10 days. I think this was in response to culture that showed group G strep although I'm not exactly sure where the culture  was from. She is also had arterial studies on 03/29/15. This showed a right ABI of 1.4 that was noncompressible. Her left ABI was 0.73. 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 It was not felt that arterial inflow was adequate for healing of a foot ulcer. Her Doppler waveforms looked monophasic ===== READMISSION 02/28/17; this is in an now 49 year old woman we've had at several different occasions in this clinic. 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 She has a remote ex-smoker. She was last seen in this clinic by Dr. Con Memos I think in May. More recently she is been followed by her podiatrist [name] an infectious disease Dr. Megan Salon. She has 2 open wounds the major one is over the right first metatarsal head she also has a wound on the left plantar foot. an MRI of the right foot on 01/01/17 showed a soft tissue ulcer along the plantar aspect of the first metatarsal base consistent with  osteomyelitis of the first metatarsal stump. Dr. 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 According to the patient this is been open for slightly over a month. She has been on a combination of Cipro 500 twice a day, Zyvox 600 twice a day and Flagyl 500 3 times a day for over a month now as directed by Dr. Megan Salon. cultures of the right foot earlier this year showed MRSA in January and Serratia in May. January also had a few viridans strep. Recent x-rays of both feet were done and [name] office and I don't have these reports. The patient has known PAD and has a history of aleft femoropopliteal bypass in April 2017. 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 Dwyane Dee. In spite of this the last hemoglobin A1c I can see is 10.1 on 01/01/2017. She is being referred by 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 She is been using Medihoney to this area and also an area on the left midfoot. She is using healing sandals bilaterally. ABIs in this clinic at the left posterior tibial was 1.1 noncompressible on the right READMISSION Non invasive vascular NOVANT 5/18 Aftercare following surgery of the circulatory system Procedure Note - Interface, External Ris In - 08/13/2016 11:05 AM EDT Procedure: Examination consists of physiologic resting arterial pressures of the brachial and ankle arteries bilaterally with continuous wave Doppler waveform analysis. Previous: Previous exam performed on 02/14/16 demonstrated ABIs of Rt = 1.19 and Lt = 1.33. Right: ABI = non-compressible PT, 1.47 DP. S/P transmet amputation. Left: ABI = 1.52, 2nd digit pressure = 87 mmHg Conclusions: Right: ABI (>1.3) may  be falsely elevated, suggesting medial calcification. Left: ABI (>1.3) may be falsely elevated, suggesting medial calcification The patient is a now 49 year old type I diabetic is had multiple issues her graded to chronic diabetic foot ulcers. 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 We had her in the clinic lastin November. At that point she had wounds on her bilateral feet.she had wanted to try hyperbarics however the healogics review process denied her because she hadn't followed up with her vascular surgeon for her left femoropopliteal bypass. The bypass was  done by Dr. Raul Del at [facility]. We made her a follow-up with Dr. Raul Del however she did not keep the appointment and therefore she was not approved The patient shows me a small wound on her left fourth metatarsal head on her phone. She developed rapid discoloration in the plantar aspect of the left foot and she was admitted to hospital from 2/2 through 05/10/17 with wet gangrene of the left foot osteomyelitis of the fourth metatarsal heads. She was admitted acutely ill with a temperature of 103. She was started on broad-spectrum vancomycin and cefepime. On 05/06/17 she was taken to the OR by [name] her podiatric surgeon for an incision and drainage irrigation of the left foot wound. Cultures from this surgery revealed group be strep and anaerobes. she was seen by Dr.Xu of orthopedic surgery and scheduled for a below-knee amputation which she u refused. Ultimately she was discharged on Levaquin and Flagyl for one month. MRI 05/05/17 done while she was in the hospital showed abscess adjacent to the fourth metatarsal head and neck small abscess around the fourth flexor tendon. Inflammatory phlegmon and gas in the soft tissues along the lateral aspect of the fourth phalanx. Findings worrisome for osteomyelitis involving the fourth proximal and middle  phalanx and also the third and fourth metatarsals. Finally the patient had actually shortly before this followed up with Dr. Raul Del at no time on 04/29/17. He felt that her left femoropopliteal bypass was patent he felt that her left-sided toe pressures more than adequate for healing a wound on the left foot. This was before her acute presentation. 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 05/28/17; she is started hyperbarics. The patient tells me that for some reason she was not actually on Levaquin but I think on ciprofloxacin. She was on Flagyl. She only started her Levaquin yesterday due to some difficulty with the pharmacy and perhaps her sister picking it up. She has an appointment with [name] tomorrow and with infectious disease early next week. She has no new complaints 06/06/17; the patient continues in hyperbarics. She saw [name] on 05/29/17 who is her podiatric surgeon. He is elected for a transmetatarsal amputation on 06/27/17. I'm not sure at what level he plans to do this amputation. The patient is unaware ooShe also saw Dr. Megan Salon of infectious disease who elected to continue her on current antibiotics I think this is ciprofloxacin and Flagyl. I'll need to clarify with her tomorrow if she actually has this. We're using silver alginate to the actual wound. Necrotic surface today with material under the flap of her foot. ooOriginal MRI showed abscesses as well as osteomyelitis of the proximal and middle fourth phalanx and the third and fourth metatarsal heads 06/11/17; patient continues in hyperbarics and continues on oral antibiotics. She is doing well. The wound looks better. The necrotic part of this under the flap in her superior foot also looks better. she is been to see [name]. I haven't had a chance to look at his note. Apparently he has put the transmetatarsal amputation on hold her request it is still planning to take her to the OR for debridement and product  application ACEL. I'll see if I can find his note. I'll therefore leave product ordering/requests to [name] for now. I was going to look at Dermagraft 06/18/17-she is here in follow-up evaluation for bilateral foot wounds. She continues with hyperbaric therapy. She states she has been applying manuka honey to the right plantar foot and alternate manuka honey and  silver alginate to the left foot, despite our orders. We will continue with same treatment plan and she will follo up next week. 06/25/17; I have reviewed [name] last note from 3/11. She has operative debridement in 2 days' time. By review his note apparently they're going to place there is skin over the majority of this wound which is a good choice. She has a small satellite area at the most proximal part of this wound on the left plantar foot. The area on the right plantar foot we've been using silver alginate and it is close to healing. 07/02/17; unfortunately the patient was not easily approved for [name] proposed surgery. I'm not completely certain what the issue is. She has been using silver alginate to the wound she has completed a first course of hyperbarics. She is still on Levaquin and Flagyl. I have really lost track of the time course here.I suspect she should have another week to 2 of antibiotics. I'll need to see if she is followed up with infectious disease Dr. Megan Salon 07/09/17; the patient is followed up with Dr. Megan Salon. 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 She continues on Levaquin and metronidazole continuing both of these for now I think she is been on fr about 6 weeks. She still has some drainage but no pain. No fever. Her had been plans for her to go to the OR for operative debridement with her podiatrist [name], I am not exactly sure where that is. I'll probably slip a note to [name] today. I note that she follows with Dr. Dwyane Dee of endocrinology. We have her  recertified for hyperbaric oxygen. I have not heard about Dermagraft however I'll see if [name] is planning a skin substitute as well 07/16/17; the patient tells me she is just about out of [place]. I'll need to check Dr. Hale Bogus last notes on this. She states she has plenty of Flagyl however. She comes in today complaining of pain in the right lateral foot which she said lasted for about a day. The wound on the right foot is actually much more medially. She also tells me that the [name] cost a lot of pain in the left foot wound and she turned back to silver alginate. Finally Dermagraft has a $601 per application co-pay. She cannot afford this 07/23/17; patient arrives today with the wound not much smaller. There is not much new to add. She has not heard from [name] all try to put in a call to them today. She was asking about Dermagraft again and she has an over $093 per application co-pay she states that she would be willing to try to do a payment plan. I been tried to avoid this. We've been using silver alginate, I'll change to [facility] 07/30/17-She is here in follow-up evaluation for left foot ulcer. She continues hyperbaric medicine. The left foot ulcer is stable we will continue with same treatment plan 08/06/17; she is here for evaluation of her left foot ulcer. Currently being treated for hyperbarics or underlying osteomyelitis. She is completed antibiotics. The left foot ulcer is better smaller with healthier looking granulation. For various reasons I am not really clear on we never got her back to the OR with [name]. He did not respond to my secure text message. Nevertheless I think that surgery on this point is not necessary nor am I completely clear that a skin substitute is necessary The  patient is complaining about pain on the outside of her right foot. 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 There is no erythema. She also states the foot is warm  versus her other part of her upper leg and this is largely true. It is not totally clear to me what's causing this. She thinks it's different from her usual neuropathy pain 08/13/17; she arrives in clinic today with a small wound which is superficial on her right first metatarsal head. 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 She tells [place] she was up on her feet over the Mother's Day celebration. ooThe large wound is on the left foot. Continues with hyperbarics for underlying osteomyelitis. We're using Hydrofera Blue. She asked me today about where we were with Dermagraft. I had actually excluded this because of the co- pay however she wants to assume this therefore I'll recheck the co-pay an order for next week. 08/20/17; the patient agreed to accept the co-pay of the first Apligraf which we applied today. She is disappointed she is finishing hyperbarics will run this through the insurance on the extent of the foot infection and the extent of the wound that she had however she is already had 60 dive's. Dermagraft No. 1 08/27/17; Dermagraft No. 2. She is not eligible for any more hyperbaric treatments this month. She reports a fair amount of drainage and she actually changed to the external dressings without disturbing the direct contact layer 09/03/17; the patient arrived in clinic today with the wound superficially looking quite healthy. Nice vibrant red tissue with some advancing epithelialization although not as much adherence of the flap as I might like. However she noted on her own fourth toe some bogginess and she brought that to our attention. Indeed this was boggy feeling like a possibility of subcutaneous fluid. 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 She is not been unwell. We've been using Dermagraft 09/10/17; the culture that I did not last week was MRSA. She saw Dr. Megan Salon this morning who is  going to start her on vancomycin. I had sent him a secure a text message yesterday. I also spoke with her podiatric surgeon [name] about surgery on this foot the options for conserving a functional foot etc. Promised me he would see her and will make back consultation today. Paradoxically her actual wound on the plantar aspect of her left foot looks really quite good. I had given her 5 days worth of Baxdella to cover her for MRSA. Her MRI came back showing osteomyelitis within the third metatarsal shaft and head and base of the third and fourth proximal phalanx. She had extensive inflammatory changes throughout the soft tissue of the lateral forefoot. With an ill-defined fluid around the fourth metatarsal extending into the plantar and dorsal soft tissues 09/19/17; the patient is actually on oral Septra and Flagyl. She apparently refused IV vancomycin. She also saw [name] at my request who is planning her for a left BKA sometime in mid July. MRI showed osteomyelitis within the third metatarsal shaft and head and the basis of the third and fourth proximal phalanx. I believe there was felt to be possible septic arthritis involving the third MTP. 09/26/17; the patient went back to Dr. Megan Salon at my suggestion and is now receiving IV daptomycin. Her wound continues to look quite good making the decision to proceed with a transmetatarsal amputation although more difficult  for the patient. I believe in my extensive discussions with her she has a good sense of the pros and cons of this. I don't NV the tuft decision she has to make. She has an appointment with [name] I believe in mid July and I previously spoken to him about this issue Has we had used 3 previous Dermagraft. Given the condition of the wound surface I went ahead and added the fourth one today area and I did this not fully realizing that she'll be traveling to [place] next week. I'm hopeful she can come back in 2 weeks 10/21/17; Her  same Dermagraft on for about 3-1/2 weeks. In spite of this the wound arrives looking quite healthy. There is been a lot of healing dimensions are smaller. Looking at the square shaped wound she has now there is some undermining and some depth medially under the undermining although I cannot palpate any bone. No surrounding infection is obvious. She has difficult questions about how to look at this going forward vis--vis amputations versus continued medical therapy. To be truthful the wound is looks so healthy and it is continued to contract. Hard to justify foot surgery at this point although I still told her that I think it might come to that if we are not able to eradicate the underlying MRSA. She is still highly at risk and she understands this 11/06/17 on evaluation today patient appears to be doing better in regard to her foot ulcer. She's been tolerating the dressing changes without complication. Currently she is here for her Dermagraft #6. Her wound continues to make excellent progress at this point. She does not appear to have any evidence of infection which is good news. 11/13/17 on evaluation today patient appears to be doing excellent at this time. She is here for repeat Dermagraft application. This is #7. Overall her wound seems to be making great progress. 12/05/17; the patient arrives with the wound in much better condition than when I last saw this almost 6 weeks ago. She still has a small probing area in the left metatarsal head region on the lateral aspect of her foot. We applied her last Dermagraft today. ooSince the last time she is here she has what appears to a been a blood blister on the plantar aspect of left foot although I don't see this is threatening. There is also a thick raised tissue on the right mid metatarsal head region. This was not there I don't think the last time she was here 3 weeks ago. 12/12/17; the patient continues to have a small programming area in the left  metatarsal head region on the lateral aspect of her foot which was the initial large surgical wound. I applied her last Apligraf last week. I'm going to use Endoform starting today ooUnfortunately she has an excoriated area in the left mid foot and the right mid foot. The left midfoot looks like a blistered area this was not opened last week it certainly is open today. Using silver alginate on these areas. She promises me she is offloading this. 12/19/17; the small probing area in the left metatarsal head eyes think is shallower. In general her original wound looks better. We've been using Endoform. The area inferiorly that I think was trauma last week still requires debridement a lot of nonviable surface which I removed. She still has an open open area distally in her foot ooSimilarly on the right foot there is tightly adherent surface debris which I removed. Still areas that  don't look completely epithelialized. This is a small open area. We used silver alginate on these areas 12/26/2017; the patient did not have the supplies we ordered from last week including the Endoform. The original large wound on the left lateral foot looks healthy. She still has the undermining area that is largely unchanged from last week. She has the same heavily callused raised edged wounds on the right mid and left midfoot. Both of these requiring debridement. We have been using silver alginate on these areas 01/02/2018; there is still supply issues. We are going to try to use Prisma but I am not sure she actually got it from what she is saying. She has a new open area on the lateral aspect of the left fourth toe [previous fifth ray amputation]. Still the one tunneling area over the fourth metatarsal head. The area is in the midfoot bilaterally still have thick callus around them. She is concerned about a raised swelling on the lateral aspect of the foot. However she is completely insensate 01/10/2018; we are using  Prisma to the wounds on her bilateral feet. Surprisingly the tunneling area over the left fourth metatarsal head that was part of her original surgery has closed down. She has a small open area remaining on the incision line. 2 open areas in the midfoot. 02/10/2018; the patient arrives back in clinic after a month hiatus. She was traveling to visit family in [place]. Is fairly clear she was not offloading the areas on her feet. The original wound over the left lateral foot at the level of metatarsal heads is reopened and probes medially by about a centimeter or 2. She notes that a week ago she had purulent drainage come out of an area on the left midfoot. Paradoxically the worst area is actually on the right foot is extensive with purulent drainage. We will use silver alginate today 02/17/2018; the patient has 3 wounds one over the left lateral foot. She still has a small area over the metatarsal heads which is the remnant of her original surgical wound. This has medial probing depth of roughly 1.4 cm somewhat better than last week. The area on the right foot is larger. We have been using silver alginate to all areas. The area on the right foot and left foot that we cultured last week showed both Klebsiella and Proteus. Both of these are quinolone sensitive. The patient put her's self on Bactrim and Flagyl that she had left hanging around from prior antibiotic usages. She was apparently on this last week when she arrived. I did not realize this. Unfortunately the Bactrim will not cover either 1 of these organisms. We will send in Cipro 500 twice daily for a week 03/04/2018; the patient has 2 wounds on the left foot one is the original wound which was a surgical wound for a deep DFU. At one point this had exposed bone. She still has an area over the fourth metatarsal head that probes about 1.4 cm although I think this is better than last week. I been using silver nitrate to try and promote  tissue adherence and been using silver alginate here. ooShe also has an area in the left midfoot. This has some depth but a small linear wound. Still requiring debridement. ooOn the right midfoot is a circular wound. A lot of thick callus around this area. ooWe have been using silver alginate to all wound areas ooShe is completed the ciprofloxacin I gave her 2 weeks ago. 03/11/2018; the patient continues  to have 2 open areas on the left foot 1 of which was the original surgical wound for a deep DFU. Only a small probing area remains although this is not much different from last week we have been using silver alginate. The other area is on the midfoot this is smaller linear but still with some depth. We have been using silver alginate here as well ooOn the right foot she has a small circular wound in the mid aspect. This is not much smaller than last time. We have been using silver alginate here as well 03/18/2018; she has 3 wounds on the left foot the original surgical wound, a very superficial wound in the mid aspect and then finally the area in the mid plantar foot. She arrives in today with a very concerning area in the wound in the mid plantar foot which is her most proximal wound. There is undermining here of roughly 1-1/2 cm superiorly. Serosanguineous drainage. She tells me she had some pain on for over the weekend that shot up her foot into her thigh and she tells me that she had a nodule in the groin area. ooShe has the single wound in the right foot. ooWe are using endoform to both wound areas 03/24/2018; the patient arrives with the original surgical wound in the area on the left midfoot about the same as last week. There is a collection of fluid under the surface of the skin extending from the surgical wound towards the midfoot although it does not reach the midfoot wound. The area on the right foot is about the same. Cultures from last week of the left midfoot wound showed  abundant Klebsiella abundant Enterococcus faecalis and moderate methicillin resistant staph I gave her Levaquin but this would have only covered the Klebsiella. She will need linezolid 04/01/2018; she is taking linezolid but for the first few days only took 1 a day. I have advised her to finish this at twice daily dosing. In any case all of her wounds are a lot better especially on the left foot. The original surgical wound is closed. The area on the left midfoot considerably smaller. The area on the right foot also smaller. 04/08/2018; her original surgical wound/osteomyelitis on the left foot remains closed. She has area on the left foot that is in the midfoot area but she had some streaking towards this. This is not connected with her original wound at least not visually. ooSmall wound on the right midfoot appears somewhat smaller. 04/15/18; both wounds looks better. Original wound is better left midfoot. Using silver alginate 1/21; patient states she uses saltwater soak in, stones or remove callus from around her wounds. She is also concerned about a blood blister she had on the left foot but it simply resolved on its own. We've been using silver alginate 1/28; the patient arrives today with the same streaking area from her metatarsals laterally [the site of her original surgical wound] down to the middle of her foot. There is some drainage in the subcutaneous area here. This concerns me that there is actually continued ongoing infection in the metatarsals probably the fourth and third. This fixates an MRI of the foot without contrast [chronic renal failure] ooThe wound in the mid part of the foot is small but I wonder whether this area actually connects with the more distal foot. ooThe area on the right midfoot is probably about the same. Callus thick skin around the small wound which I removed with a curette we have  been using silver alginate on both wound areas 2/4; culture I did of the  draining site on the left foot last time grew methicillin sensitive staph aureus. MRI of the left foot showed interval resolution of the findings surrounding the third metatarsal joint on the prior study consistent with treated osteomyelitis. Chronic soft tissue ulceration in the plantar and lateral aspect of the forefoot without residual focal fluid collection. No evidence of recurrent osteomyelitis. 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 She tells me she had nausea and vomiting over the weekend and missed 2 days. In general her foot looks much the same. She has a small open area just below the left fourth metatarsal head. A linear area in the left midfoot. Some discoloration extending from the inferior part of this into the left lateral foot although this appears to be superficial. She has a small area on the right midfoot which generally looks smaller after debridement 2/18; the patient is completing his cephalexin and has another 2 days. She continues to have open areas on the left and right foot. 2/25; she is now off antibiotics. The area on the left foot at the site of her original surgical wound has closed yet again. She still has open areas in the mid part of her foot however these appear smaller. The area on the right mid foot looks about the same. We have been using silver alginate She tells me she had a serious hypoglycemic spell at home. She had to have EMS called and get IV dextrose 3/3; disappointing on the left lateral foot large area of necrotic tissue surrounding the linear area. This appears to track up towards the same original surgical wound. Required extensive debridement. The area on the right plantar foot is not a lot better also  using silver 3/12; the culture I did last time showed abundant enterococcus. I have prescribed Augmentin, should cover any unrecognized anaerobes as well. In addition there were a few MRSA and Serratia that would not be well covered although I did not want to give her multiple antibiotics. She comes in today with a new wound in the right midfoot this is not connected with the original wound over her MTP a lot of thick callus tissue around both wounds but once again she said she is not walking on these areas 3/17-Patient comes in for follow-up on the bilateral plantar wounds, the right midfoot and the left plantar wound. Both these are heavily callused surrounding the wounds. We are continuing to use silver alginate, she is compliant with offloading and states she uses a wheelchair fairly often at home 3/24; both wound areas have thick callus. However things actually look quite a bit better here for the majority of her left foot and the right foot. 3/31; patient continues to have thick callused somewhat irritated looking tissue around the wounds which individually are fairly superficial. There is no evidence of surrounding infection. We have been using silver alginate however I change that to [facility] today 4/17; patient returns to clinic after having a scare with Covid she tested negative in her primary doctor's office. She has been using Hydrofera Blue. She does not have an open area on the right foot. On the left foot she has a small open  area with the mid area not completely viable. She showed me pictures of what looks like a hemorrhagic blister from several days ago but that seems to have healed over this was on the lateral left foot 4/21; patient comes in to clinic with both her wounds on her feet closed. However over the weekend she started having pain in her right foot and leg up into the thigh. She felt as though she was running a low-grade fever but did not take her temperature.  She took a doxycycline that she had leftover and yesterday a single Septra and metronidazole. She thinks things feel somewhat better. 4/28; duplex ultrasound I ordered last week was negative for DVT or superficial thrombophlebitis. She is completed the doxycycline I gave her. States she is still having a lot of pain in the right calf and right ankle which is no better than last week. She cannot sleep. She also states she has a temperature of up to 101, coughing and complaining of visual loss in her bilateral eyes. Apparently she was tested for Covid 2 weeks ago at [facility] and that was negative. Readmission: 09/03/18 patient presents back for reevaluation after having been evaluated at the end of April regarding erythema and swelling of her right lower extremity. Subsequently she ended up going to the hospital on 07/29/18 and was admitted not to be discharged until 08/08/18. Unfortunately it was noted during the time that she was in the hospital that she did have methicillin-resistant Staphylococcus aureus as the infection noted at the site. It was also determined that she did have osteomyelitis which appears to be fairly significant. She was treated with vancomycin and in fact is still on IV vancomycin at dialysis currently. This is actually slated to continue until 09/12/18 at least which will be the completion of the six weeks of therapy. Nonetheless based on what I'm seeing at this point I'm not sure she will be anywhere near ready to discontinue antibiotics at that time. Since she was released from the hospital she was seen by [name] who is her podiatrist on 08/27/18. His note specifically states that he is recommended that the patient needs of one knee amputation on the right as she has a life-threatening situation that can lead quickly to sepsis. The patient advised she would like to try to save her leg to which [name] apparently told her that this was against all medical advice. She also want  to discontinue the Wound VAC which had been initiated due to the fact that she wasn't pleased with how the wound was looking and subsequently she wanted to pursue applying Medihoney at that time. He stated that he did not believe that the right lower extremity was salvageable and that the patient understood but would still like to attempt hyperbaric option therapy if it could be of any benefit. She was therefore referred back to [place] for further evaluation. He plans to see her back next week. Upon inspection today patient has a significant amount purulent drainage noted from the wound at this point. The bone in the distal portion of her foot also appears to be extremely necrotic and spongy. When I push down on the bone it bubbles and seeps purulent drainage from deeper in the end of the foot. I do not think that this is likely going to heal very well at all and less aggressive surgical debridement were undertaken more than what I believe we can likely do here in our office. 09/12/2018; I have not seen  this patient since the most recent hospitalization although she was in our clinic last week. I have reviewed some of her records from a complex hospitalization. 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 There is situation was complicated by MRSA bacteremia and acute on chronic renal failure now on dialysis. She is receiving vancomycin at dialysis. We started her on Dakin's wet-to- dry last week she is changing this daily. There is still purulent drainage coming out of her foot. Although she is apparently "agreeable" to a below-knee amputation which is been suggested by multiple clinicians she wants this to be done in [place]. She apparently has a telehealth visit with that provider sometime in late [name] 6/24. I have told her I think this is probably too long. Nevertheless I could not convince her to allow a local doctor to perform BKA. 09/19/2018; the  patient has a large necrotic area on the right anterior foot. She has had previous transmetatarsal amputations. Culture I did last week showed MRSA nothing else she is on vancomycin at dialysis. She has continued leaking purulent drainage out of the distal part of the large circular wound on the right anterior foot. She apparently went to see Dr. 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 Somehow that translated into her being referred to plastic surgery for debridement of the area. I gather she basically refused amputation although I do not have a copy of Dr. Berenice Primas notes. The patient really wants to have a trial of hyperbaric oxygen. I agreed with initial assessment in this clinic that this was probably too far along to benefit however if she is going to have plastic surgery I think she would benefit from ancillary hyperbaric oxygen. The issue here is that the patient has benefited as maximally as any patient I have ever seen from hyperbaric oxygen therapy. Most recently she had exposed bone on the lateral part of her left foot after a surgical procedure and that actually has closed. She has eschared areas in both heels but no open area. She is remained systemically well. I am not optimistic that anything can be done about this but the patient is very clear that she wants an attempt. The attempt would include a wound VAC further debridements and hyperbaric oxygen along with IV antibiotics. 6/26; I put her in for a trial of hyperbaric oxygen only because of the dramatic response she has had with wounds on her left midfoot earlier this year which was a surgical wound that went straight to her bone over the metatarsal heads and also remotely the left third toe. We will see if we can get this through our review process and insurance. She arrives in clinic with again purulent material pouring out of necrotic bone on the top of the foot distally. There is also some  concerning erythema on the front of the leg that we marked. It is bit difficult to tell how tender this is because of neuropathy. I note from infectious disease that she had her vancomycin extended. 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 She had the wound VAC on for part of the week. The rest of the time she is putting various things on this including Medihoney, "ionized water" silver sorb gel etc. 7/7; follow-up along with HBO. She is still on vancomycin at dialysis. She has a large open area on the dorsal right foot and a small dark eschar area on her heel.  There is a lot less erythema in the area and a lot less tenderness. From an infection point of view I think this is better. She still has a lot of necrosis in the remaining right forefoot [previous TMA] we are still using the wound VAC in this area 7/16; follow-up along with HBO. I put her on linezolid after she finished her vancomycin. We started this last Friday I gave her 2 weeks worth. I had the expectation that she would be operatively debrided by Dr. Marla Roe but that still has not happened yet. Patient phoned the office this week. She arrives for review today after HBO. The distal part of this wound is completely necrotic. Nonviable pieces of tendon bone was still purulent drainage. Also concerning that she has black eschar over the heel that is expanding. I think this may be indicative of infection in this area as well. She has less erythema and warmth in the ankle and calf but still an abnormal exam 7/21 follow-up along with HBO. I will renew her linezolid after checking a CBC with differential monitoring her blood counts especially her platelets. She was supposed to have surgery yesterday but if I am reading things correctly this was canceled after her blood sugar was found to be over 500. I thought Dr. Marla Roe who called me said that they were sending her to the ER but the patient states that  was not the case. 7/28. Follow-up along with HBO. She is on linezolid I still do not have any lab work from dialysis even though I called last week. The patient is concerned about an area on her left lateral foot about the level of the base of her fifth metatarsal. I did not really see anything that ominous here however this patient is in [place] ability to point out problems that she is sensing and she has been accurate in the past Finally she received a call from Dr. Marla Roe who is referring her to another orthopedic surgeon stating that she is too booked up to take her to the operating room now. Was still using a wound VAC on the foot 8/3 -Follow-up after HBO, she is got another week of linezolid, she is to call [facility] for an appointment, x-rays of both feet were reviewed, the left foot x-ray with third MTP joint osteo- Right foot x-ray widespread osteo-in the right midfoot Right ankle x-ray does not show any active evidence of infection 8/11-Patient is seen after HBO, the wounds on the right foot appear to be about the same, the heel wound had some necrotic base over tendon that was debrided with a curette 8/21; patient is seen after HBO. The patient's wound on her dorsal foot actually looks reasonably good and there is substantial amount of epithelialization however the open area distally still has a lot of necrotic debris partially bone. I cannot really get a good sense of just how deep this probes under the foot. She has been pressuring me this week to order medical maggots through a company in [place] for her. The problem I have is there is not a defined wound area here. On the positive side there is no purulence. She has been to see infectious disease she is still on Septra DS although I have not had a chance to review their notes 8/28; patient is seen in conjunction with HBO. The wounds on her foot continued to improve including the right dorsal foot substantially the, the distal part  of this wound and the area on the  right heel. We have been using a wound VAC over this chronically. She is still on trimethoprim as directed by infectious disease 9/4; patient is seen in conjunction with HBO. Right dorsal foot wound substantially anteriorly is better however she continues to have a deep wound in the distal part of this that is not responding. We have been using silver collagen under border foam ooArea on the right plantar medial heel seems better. We have been using Hydrofera Blue 12/12/18 on evaluation today patient appears to be doing about the same with regard to her wound based on prior measurements. She does have some necrotic tissue noted on the lateral aspect of the wound that is going require a little bit of sharp debridement today. This includes what appears to be potentially either severely necrotic bone or tendon. Nonetheless other than that she does not appear to have any severe infection which is good news 9/18; it is been 2 weeks since I saw this wound. She is tolerating HBO well. Continued dramatic improvement in the area on the right dorsal foot. She still has a small wound on the heel that we have been using Hydrofera Blue. She continues with a wound VAC 9/24; patient has to be seen emergently today with a swelling on her right lateral lower leg. She says that she told Dr. Evette Doffing about this and also myself on a couple of occasions but I really have no recollection of this. She is not systemically unwell and her wound really looked good the last time I saw this. She showed this to providers at dialysis and she was able to verify that she was started on cephalexin today for 5 doses at dialysis. She dialyzes on Tuesday Thursday and Saturday. 10/2; patient is seen in conjunction with HBO. The area that is draining on the right anterior medial tibia is more extensive. Copious amounts of serosanguineous drainage with some purulence. We are still using the wound VAC on  the original wound then it is stable. Culture I did of the original IandD showed MRSA I contacted dialysis she is now on vancomycin with dialysis treatments. I asked them to run a month 10/9; patient seen in conjunction with HBO. She had a new spontaneous open area just above the wound on the right medial tibia ankle. More swelling on the right medial tibia. Her wound on the foot looks about the same perhaps slightly better. There is no warmth spreading up her leg but no obvious erythema. her MRI of the foot and ankle and distal tib-fib is not booked for next Friday I discussed this with her in great detail over multiple days. it is likely she has spreading infection upper leg at least involving the distal 25% above the ankle. She knows that if I refer her to orthopedics for infectious disease they are going to recommend amputation and indeed I am not against this myself. We had a good trial at trying to heal the foot which is what she wanted along with antibiotics debridement and HBO however she clearly has spreading infection [probably staph aureus/MRSA]. Nevertheless she once again tells me she wants to wait the left of the MRI. She still makes comments about having her amputation done in [place]. 10/19; arrives today with significant swelling on the lateral right leg. Last culture I did showed Klebsiella. Multidrug- resistant. Cipro was intermediate sensitivity and that is what I have her on pending her MRI which apparently is going to be done on Thursday this week although this seems  to be moving back and forth. She is not systemically unwell. We are using silver alginate on her major wound area on the right medial foot and the draining areas on the right lateral lower leg 10/26; MRI showed extensive abscess in the anterior compartment of the right leg also widespread osteomyelitis involving osseous structures of the midfoot and portions of the hindfoot. Also suspicion for osteomyelitis  anterior aspect of the distal medial malleolus. Culture I did of the purulence once again showed a multidrug-resistant Klebsiella. I have been in contact with nephrology late last week and she has been started on cefepime at dialysis to replace the vancomycin We sent a copy of her MRI report to Dr. Geroge Baseman in [place] who is an orthopedic surgeon. The patient takes great stock in his opinion on this. She says she will go to [place] to have her leg amputated if Dr. Geroge Baseman does not feel there is any salvage options. 11/2; she still is not talk to her orthopedic surgeon in [place]. Apparently he will call her at [phone] this afternoon. The quality of this is she has not allowed me to refer her anywhere. She has been told over and over that she needs this amputated but has not agreed to be referred. She tells me her blood sugar was 600 last night but she has not been febrile. 11/9; she never did got a call from the orthopedic surgeon in [place] therefore that is off the radar. We have arranged to get her see orthopedic surgery at [facility]. She still has a lot of draining purulence coming out of the new abscess in her right leg although that probably came from the osteomyelitis in her right foot and heel. Meanwhile the original wound on the right foot looks very healthy. Continued improvement. The issue is that the last MRI showed osteomyelitis in her right foot extensively she now has an abscess in the right anterior lower leg. There is nobody in [place] who will offer this woman anything but an amputation and to be honest that is probably what she needs. I think she still wants to talk about limb salvage although at this point I just do not see that. She has completed her vancomycin at dialysis which was for the original staph aureus she is still on cefepime for the more recent Klebsiella. She has had a long course of both of these antibiotics which should have benefited the  osteomyelitis on the right foot as well as the abscess. 11/16; apparently [place] elective surgery is shut down because of COVID-19 pandemic. I have reached out to some contacts at [facility] to see if we can get her an orthopedic appointment there. I am concerned about continually leaving this but for the moment everything is static. In fact her original large wound on this foot is closing down. It is the abscess on the right anterior leg that continues to drain purulent serosanguineous material. She is not currently on any antibiotics however she had a prolonged course of vancomycin [1 month] as well as cefepime for a month 02/24/2019 on evaluation today patient appears to be doing better than the last time I saw her. This is not a patient that I typically see. With that being said I am covering for Dr. Dellia Nims this week and again compared to when I last saw her overall the wounds in particular seem to be doing significantly better which is good news. With that being said the patient tells me several disconcerting things. She has not been  able to get in to see anyone for potential debridement in regard to her leg wounds although she tells me that she does not think it is necessary any longer because she is taking care of that herself. She noticed a string coming out of the lower wound on her leg over the last week. The patient states that she subsequently decided that we must of pack something in there and started pulling the string out and as it kept coming and coming she realized this was likely her tendon. With that being said she continued to remove as much of this as she could. She then I subsequently proceeded to using tubes of antibiotic ointment which she will stick down into the wound and then scored as much as she can until she sees it coming out of the other wound opening. She states that in doing this she is actually made things better and there is less redness and irritation. With  regard to her foot wound she does have some necrotic tendon and tissue noted in one small corner but again the actual wound itself seems to be doing better with good granulation in general compared to my last evaluation. 12/7; continued improvement in the wound on the substantial part of the right medial foot. Still a necrotic area inferiorly that required debridement but the rest of this looks very healthy and is contracting. She has 2 wounds on the right lateral leg which were her original drainage sites from her abscess but all of this looks a lot better as well. She has been using silver alginate after putting antibiotic biotic ointment in one wound and watching it come out the other. I have talked to her in some detail today. I had given her names of orthopedic surgeons at [facility] for second opinion on what to do about the right leg. I do not think the patient never called them. She has not been able to get a hold of the orthopedic surgeon in [place] that she had put a lot of faith in as being somebody would give her an opinion that she would trust. I talked to her today and said even if I could get her in to another orthopedic surgeon about the leg which she accept an amputation and she said she would not therefore I am not going to press this issue for the moment 12/14; continued improvement in his substantial wound on the right medial foot. There is still a necrotic area inferiorly with tightly adherent necrotic debris which I have been working on debriding each time she is here. She does not have an orthopedic appointment. Since last time she was here I looked over her cultures which were essentially MRSA on the foot wound and gram- negative rods in the abscess on the anterior leg. 12/21; continued improvement in the area on the right medial foot. She is not up on this much and that is probably a good thing since I do not know it could support continuous ambulation. She has a small  area on the right lateral leg which were remanence of the IandD's I did because of the abscess. I think she should probably have prophylactic antibiotics I am going to have to look this over to see if we can make an intelligent decision here. In the meantime her major wound is come down nicely. Necrotic area inferiorly is still there but looks a lot better 04/06/2019; she has had some improvement in the overall surface area on the right medial foot  somewhat narrowedr both but somewhat longer. The areas on the right lateral leg which were initial IandD sites are superficial. Nothing is present on the right heel. We are using silver alginate to the wound areas 1/18; right medial foot somewhat smaller. Still a deep probing area in the most distal recess of the wound. She has nothing open on the right leg. She has a new wound on the plantar aspect of her left fourth toe which may have come from just pulling skin. The patient using Medihoney on the wound on her foot under silver alginate. I cannot discourage her from this 2/1; 2-week follow-up using silver alginate on the right foot and her left fourth toe. The area on the right dorsal foot is contracted although there is still the deep area in the most distal part of the wound but still has some probing depth. No overt infection 2/15; 2-week follow-up. She continues to have improvement in the surface area on the dorsal right foot. Even the tunneling area from last time is almost closed. The area that was on the plantar part of her left fourth toe over the PIP is indeed closed Objective Constitutional Patient is hypertensive.. Pulse regular and within target range for patient.Marland Kitchen Respirations regular, non-labored and within target range.. Temperature is normal and within the target range for the patient.Marland Kitchen Appears in no distress. Vitals Time Taken: 1:30 PM, Height: 67 in, Weight: 125 lbs, BMI: 19.6, Temperature: 98.2 F, Pulse: 92 bpm, Respiratory Rate:  16 breaths/min, Blood Pressure: 171/75 mmHg. Cardiovascular Her pulses are palpable. Abscess area on the right anterior lower leg resolved. General Notes: Wound exam; continued improvement in the surface of the wound of the dorsal right foot. This is come in nicely even the depth part of this wound distally is closing down. There is no need for debridement. ooThe area on the plantar left fourth toe over the PIP is indeed closed Integumentary (Hair, Skin) Wound #43 status is Open. Original cause of wound was Gradually Appeared. The wound is located on the Right,Medial Foot. The wound measures 3cm length x 2cm width x 0.1cm depth; 4.712cm^2 area and 0.471cm^3 volume. There is muscle, tendon, and Fat Layer (Subcutaneous Tissue) Exposed exposed. There is no tunneling or undermining noted. There is a medium amount of serosanguineous drainage noted. The wound margin is flat and intact. There is large (67-100%) red, pink, friable granulation within the wound bed. There is a small (1-33%) amount of necrotic tissue within the wound bed including Adherent Slough and Necrosis of Muscle. Wound #48 status is Healed - Epithelialized. Original cause of wound was Trauma. The wound is located on the Left Toe Fourth. The wound measures 0cm length x 0cm width x 0cm depth; 0cm^2 area and 0cm^3 volume. There is no tunneling or undermining noted. There is a none present amount of drainage noted. The wound margin is flat and intact. There is no granulation within the wound bed. There is no necrotic tissue within the wound bed. 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 Dressing Change Frequency: Wound #43  Right,Medial Foot: Change dressing three times week. Wound Cleansing: Clean wound with Wound Cleanser - Anasept to all wounds Primary Wound Dressing: Wound #43 Right,Medial Foot: Calcium Alginate with Silver -  lightly pack Other: - may use thin layer of medihoney under alginate Secondary Dressing: Wound #43 Right,Medial Foot: Kerlix/Rolled [name] ABD pad Edema Control: Elevate legs to the level of the heart or above for 30 minutes daily and/or when sitting, a frequency of: - throughout the day Home Health: [facility] for wound care. - Interim 1. We will continue with silver alginate. She uses Medihoney and some of these wound areas as well 2. The area on the left fourth toe plantar is closed I have urged her to pad this as the joint on the PIP is almost subluxed leaving it right against her shoe. 3. She is anxious to attempt to stand and walk short distances. I really did not know what to tell her. I am not sure there is enough intact structure on the right foot to "walk". Also with her neuropathy she might not know if she was doing damage to the area. Nevertheless no matter what I say she is going to do it. I emphasized taking the slowly short distances in her home standing to put dishes in the cupboard for instance and just seeing how it goes. Electronic Signature(s) Signed: 05/18/2019 6:16:05 PM By: Linton Ham MD Entered By: Linton Ham on 05/18/2019 14:38:20 -------------------------------------------------------------------------------- SuperBill Details Patient Name: Date of Service: [name] 05/18/2019 Medical Record Number:[id number] Patient Account Number: [id number] Date of Birth/Sex: Treating RN: 1970/06/27 (49 y.o. 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 Type 1 diabetes mellitus with foot ulcer L97.521 Non-pressure chronic ulcer of other part of left foot limited to breakdown of skin Facility Procedures CPT4 Code: [id number] Description: 99213 - WOUND CARE VISIT-LEV 3 EST PT Modifier: Quantity: 1 Physician Procedures CPT4 Code Description: [id number] 99213 - WC PHYS LEVEL 3 - EST PT ICD-10 Diagnosis Description M86.671 Other chronic osteomyelitis, right ankle and foot 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

## 2019-05-19 ENCOUNTER — Telehealth: Payer: Self-pay | Admitting: Nutrition

## 2019-05-19 NOTE — Telephone Encounter (Signed)
Dexcom order sent to [facility] on 04/07/19

## 2019-05-25 ENCOUNTER — Other Ambulatory Visit: Payer: Self-pay

## 2019-05-25 ENCOUNTER — Telehealth: Payer: Self-pay | Admitting: Nutrition

## 2019-05-25 MED ORDER — DEXCOM G6 RECEIVER DEVI: 1.0000 | 0 refills | Status: DC

## 2019-05-25 MED ORDER — DEXCOM G6 SENSOR MISC: 1.0000 | 3 refills | Status: DC

## 2019-05-25 MED ORDER — DEXCOM G6 TRANSMITTER MISC: 1.0000 | 3 refills | Status: DC

## 2019-05-25 NOTE — Telephone Encounter (Signed)
Message left on my machine wanting to know status of her Dexcom.  Says is running out of Medtronic pump supplies.  Please advise her.

## 2019-05-25 NOTE — Telephone Encounter (Signed)
Rx sent directly to [facility]. This is who Dexcom rep instructed [place] to send to. They will assist in getting pt everything she needs to get the Dexcom.   Pt stated that the reason for her calling was because she previously spoke with L.Spagnola,RN,CDE and she was going to leave supplies for her pump at the front office and her sister came to pick them up and staff could not find them. Vaughan Basta, was this placed somewhere else that staff maybe did not look?

## 2019-05-27 ENCOUNTER — Telehealth: Payer: Self-pay | Admitting: Nutrition

## 2019-05-27 NOTE — Telephone Encounter (Signed)
Supplies for Medtronic pump left at front desk for her.

## 2019-05-27 NOTE — Telephone Encounter (Signed)
Patient was told that medtronic pump supplies were left up front for her

## 2019-05-28 ENCOUNTER — Ambulatory Visit
Admission: RE | Admit: 2019-05-28 | Discharge: 2019-05-28 | Disposition: A | Discharge status: Home / Self Care | Payer: Medicare HMO | Source: Ambulatory Visit | Attending: Physician Assistant | Admitting: Physician Assistant

## 2019-05-28 ENCOUNTER — Other Ambulatory Visit: Payer: Self-pay

## 2019-05-28 ENCOUNTER — Telehealth: Payer: Self-pay | Admitting: Endocrinology

## 2019-05-28 DIAGNOSIS — Z1231 Encounter for screening mammogram for malignant neoplasm of breast: Secondary | ICD-10-CM

## 2019-05-28 NOTE — Telephone Encounter (Signed)
[place] to follow up on Dexcom order. Spoke with Cyndi Bender, patient care coordinator. She stated that due to her having [facility], she is required to use a DME supplier. Her DME company is [employer], and according to [place], the pt has received a 90 day supply in January of Dexcom supplies. Patient has reported that she has not been able to get any supplies approved whatsoever. Calling [employer] to get more information.

## 2019-05-28 NOTE — Telephone Encounter (Signed)
Called [employer] and they stated that they could not ever get insurance approval to supply Dexcom supplies because her insurance has a contract with [employer] directly to supply these. Will be contacting Dexcom at this time. Pt reports that [employer]com has not received "enough" chart notes, but there was never a request for chart notes made by Dexcom.

## 2019-05-28 NOTE — Telephone Encounter (Signed)
Reached out to [facility] rep to get an appropriate number to call for Dexcom. He is going to get back to [place] with a good number to ensure this patient is taken care of.

## 2019-05-28 NOTE — Telephone Encounter (Signed)
Patient called saying [employer] is not going to let her get her Dexcom due to not having enough chart notes. Patient requests Olen Cordial to give her a call at [phone].

## 2019-05-28 NOTE — Progress Notes (Signed)
[name], MCDOWELL ([id number]) Visit Report for 05/18/2019 Arrival Information Details Patient Name: Date of Service: VONETTE, [name] 05/18/2019 1:00 PM Medical Record Number:[id number] Patient Account Number: [id number] Date of Birth/Sex: Treating RN: 10/12/1970 (49 y.o. [name] Primary Care Lanyia Jewel: [name] Other Clinician: Referring Westlyn Glaza: Treating Linet Brash/Extender:[name], [name], [name] in Treatment: 8 Visit Information History Since Last Visit All ordered tests and consults were completed: No Patient Arrived: Wheel Chair Added or deleted any medications: No Arrival Time: 13:19 Any new allergies or adverse reactions: No Accompanied By: self Had a fall or experienced change in No activities of daily living that may affect Transfer Assistance: None risk of falls: Patient Identification Verified: Yes Signs or symptoms of abuse/neglect since last No Secondary Verification Process Completed: Yes visito Patient Requires Transmission-Based No Hospitalized since last visit: No Precautions: Implantable device outside of the clinic excluding No Patient Has Alerts: No cellular tissue based products placed in the center since last visit: Has Dressing in Place as Prescribed: Yes Pain Present Now: No Electronic Signature(s) Signed: 05/20/2019 5:45:19 PM By: Carlene Coria RN Entered By: Carlene Coria on 05/18/2019 13:30:25 -------------------------------------------------------------------------------- Clinic Level of Care Assessment Details Patient Name: Date of Service: [name], [name] 05/18/2019 1:00 PM Medical Record Number:[id number] Patient Account Number: [id number] Date of Birth/Sex: Treating RN: [birth year] (49 y.o. Nancy Fetter Primary Care Tyonna Talerico: [name] Other Clinician: Referring Envy Meno: Treating Dajuan Turnley/Extender:[name], [name], [name] in Treatment: 79 Clinic Level of Care Assessment Items TOOL  4 Quantity Score X - Use when only an EandM is performed on FOLLOW-UP visit 1 0 ASSESSMENTS - Nursing Assessment / Reassessment X - Reassessment of Co-morbidities (includes updates in patient status) 1 10 X - Reassessment of Adherence to Treatment Plan 1 5 ASSESSMENTS - Wound and Skin Assessment / Reassessment X - Simple Wound Assessment / Reassessment - one wound 1 5 []  - Complex Wound Assessment / Reassessment - multiple wounds 0 []  - Dermatologic / Skin Assessment (not related to wound area) 0 ASSESSMENTS - Focused Assessment []  - Circumferential Edema Measurements - multi extremities 0 []  - Nutritional Assessment / Counseling / Intervention 0 X - Lower Extremity Assessment (monofilament, tuning fork, pulses) 1 5 []  - Peripheral Arterial Disease Assessment (using hand held doppler) 0 ASSESSMENTS - Ostomy and/or Continence Assessment and Care []  - Incontinence Assessment and Management 0 []  - Ostomy Care Assessment and Management (repouching, etc.) 0 PROCESS - Coordination of Care X - Simple Patient / Family Education for ongoing care 1 15 []  - Complex (extensive) Patient / Family Education for ongoing care 0 X - Staff obtains Programmer, systems, Records, Test Results / Process Orders 1 10 X - Staff telephones HHA, Nursing Homes / Clarify orders / etc 1 10 []  - Routine Transfer to another Facility (non-emergent condition) 0 []  - Routine Hospital Admission (non-emergent condition) 0 []  - New Admissions / Biomedical engineer / Ordering NPWT, Apligraf, etc. 0 []  - Emergency Hospital Admission (emergent condition) 0 X - Simple Discharge Coordination 1 10 []  - Complex (extensive) Discharge Coordination 0 PROCESS - Special Needs []  - Pediatric / Minor Patient Management 0 []  - Isolation Patient Management 0 []  - Hearing / Language / Visual special needs 0 []  - Assessment of Community assistance (transportation, D/C planning, etc.) 0 []  - Additional assistance / Altered mentation 0 []  -  Support Surface(s) Assessment (bed, cushion, seat, etc.) 0 INTERVENTIONS - Wound Cleansing / Measurement X - Simple Wound Cleansing - one wound 1 5 []  - Complex  Wound Cleansing - multiple wounds 0 X - Wound Imaging (photographs - any number of wounds) 1 5 []  - Wound Tracing (instead of photographs) 0 X - Simple Wound Measurement - one wound 1 5 []  - Complex Wound Measurement - multiple wounds 0 INTERVENTIONS - Wound Dressings X - Small Wound Dressing one or multiple wounds 1 10 []  - Medium Wound Dressing one or multiple wounds 0 []  - Large Wound Dressing one or multiple wounds 0 X - Application of Medications - topical 1 5 []  - Application of Medications - injection 0 INTERVENTIONS - Miscellaneous []  - External ear exam 0 []  - Specimen Collection (cultures, biopsies, blood, body fluids, etc.) 0 []  - Specimen(s) / Culture(s) sent or taken to Lab for analysis 0 []  - Patient Transfer (multiple staff / Civil Service fast streamer / Similar devices) 0 []  - Simple Staple / Suture removal (25 or less) 0 []  - Complex Staple / Suture removal (26 or more) 0 []  - Hypo / Hyperglycemic Management (close monitor of Blood Glucose) 0 []  - Ankle / Brachial Index (ABI) - do not check if billed separately 0 X - Vital Signs 1 5 Has the patient been seen at the hospital within the last three years: Yes Total Score: 105 Level Of Care: New/Established - Level 3 Electronic Signature(s) Signed: 05/18/2019 6:14:31 PM By: [name] RN, BSN Entered By: [name] on 05/18/2019 17:10:50 -------------------------------------------------------------------------------- Encounter Discharge Information Details Patient Name: Date of Service: [name] 05/18/2019 1:00 PM Medical Record Number:[id number] Patient Account Number: [id number] Date of Birth/Sex: Treating RN: 06-Jan-1971 (49 y.o. [name] Primary Care Mindel Friscia: [name] Other Clinician: Referring Abbiegail Landgren: Treating Siah Kannan/Extender:[name],  [name], [name] in Treatment: 53 Encounter Discharge Information Items Discharge Condition: Stable Ambulatory Status: Wheelchair Discharge Destination: Home Transportation: Private Auto Accompanied By: self Schedule Follow-up Appointment: Yes Clinical Summary of Care: Electronic Signature(s) Signed: 05/18/2019 6:19:44 PM By: Deon Pilling Entered By: Deon Pilling on 05/18/2019 14:23:14 -------------------------------------------------------------------------------- Lower Extremity Assessment Details Patient Name: Date of Service: MORGANE, JOERGER 05/18/2019 1:00 PM Medical Record Number:[id number] Patient Account Number: [id number] Date of Birth/Sex: Treating RN: [birth year] (49 y.o. [name] Primary Care Yutaka Holberg: [name] Other Clinician: Referring Anntoinette Haefele: Treating Evelin Cake/Extender:[name], [name], [name] in Treatment: 36 Edema Assessment Assessed: [Left: No] [Right: No] Edema: [Left: No] [Right: No] Calf Left: Right: Point of Measurement: cm From Medial Instep 30.5 cm 26 cm Ankle Left: Right: Point of Measurement: cm From Medial Instep 19 cm 18 cm Electronic Signature(s) Signed: 05/20/2019 5:45:19 PM By: Carlene Coria RN Entered By: Carlene Coria on 05/18/2019 13:31:12 -------------------------------------------------------------------------------- Multi Wound Chart Details Patient Name: Date of Service: [name] 05/18/2019 1:00 PM Medical Record Number:[id number] Patient Account Number: [id number] Date of Birth/Sex: Treating RN: [birth year] (49 y.o. Nancy Fetter Primary Care Wilda Wetherell: [name] Other Clinician: Referring Cady Hafen: Treating Cleveland Paiz/Extender:[name], [name], [name] in Treatment: 36 Vital Signs Height(in): 78 Pulse(bpm): 69 Weight(lbs): 125 Blood Pressure(mmHg): 171/75 Body Mass Index(BMI): 20 Temperature(F): 98.2 Respiratory 16 Rate(breaths/min): Photos: [43:No  Photos] [48:No Photos] [N/A:N/A] Wound Location: [43:Right Foot - Medial] [48:Left Toe Fourth] [N/A:N/A] Wounding Event: [43:Gradually Appeared] [48:Trauma] [N/A:N/A] Primary Etiology: [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] [48:Lower Extremity] Comorbid History: [43:Cataracts, Chronic sinus Cataracts, Chronic sinus N/A problems/congestion, Anemia, Sleep Apnea, Deep Anemia, Sleep Apnea, Deep Vein Thrombosis, Hypertension, Peripheral Hypertension, Peripheral Arterial Disease, Type I Diabetes,  Osteoarthritis, Osteomyelitis, Neuropathy, Osteomyelitis, Neuropathy, Seizure Disorder] [48:problems/congestion, Vein Thrombosis, Arterial Disease, Type I Diabetes,  Osteoarthritis, Seizure Disorder] Date Acquired: [43:08/04/2018] [48:04/17/2019] [N/A:N/A] Weeks of Treatment: [43:36] [48:4] [N/A:N/A] Wound Status: [43:Open] [48:Healed - Epithelialized] [N/A:N/A] Clustered Wound: [43:Yes] [48:No] [N/A:N/A] Clustered Quantity: [43:3] [48:N/A] [N/A:N/A] Measurements L x W x D 3x2x0.1 [48:0x0x0] [N/A:N/A] (cm) Area (cm) : [43:4.712] [48:0] [N/A:N/A] Volume (cm) : [43:0.471] [48:0] [N/A:N/A] % Reduction in Area: [43:93.50%] [48:100.00%] [N/A:N/A] % Reduction in Volume: 97.80% [48:100.00%] [N/A:N/A] Classification: [43:Grade 4] [48:Grade 1] [N/A:N/A] Exudate Amount: [43:Medium] [48:None Present] [N/A:N/A] Exudate Type: [43:Serosanguineous] [48:N/A] [N/A:N/A] Exudate Color: [43:red, brown] [48:N/A] [N/A:N/A] Wound Margin: [43:Flat and Intact] [48:Flat and Intact] [N/A:N/A] Granulation Amount: [43:Large (67-100%)] [48:None Present (0%)] [N/A:N/A] Granulation Quality: [43:Red, Pink, Friable] [48:N/A] [N/A:N/A] Necrotic Amount: [43:Small (1-33%)] [48:None Present (0%)] [N/A:N/A] Exposed Structures: [43:Fat Layer (Subcutaneous Tissue) Exposed: Yes Tendon: Yes Muscle: Yes Fascia: No Joint: No Bone: No Large (67-100%)] [48:Fascia: No Fat Layer (Subcutaneous Tissue) Exposed: No  Tendon: No Muscle: No Joint: No Bone: No Large (67-100%)] [N/A:N/A N/A] Treatment Notes Wound #43 (Right, Medial Foot) 1. Cleanse With Wound Cleanser 3. Primary Dressing Applied Calcium Alginate Ag 4. Secondary Dressing Dry [name] 5. Secured With Elastic bandage Notes ace wrap patient request. Electronic Signature(s) Signed: 05/18/2019 6:14:31 PM By: [name] RN, BSN Signed: 05/18/2019 6:16:05 PM By: Linton Ham MD Entered By: Linton Ham on 05/18/2019 14:34:38 -------------------------------------------------------------------------------- Multi-Disciplinary Care Plan Details Patient Name: Date of Service: [name] 05/18/2019 1:00 PM Medical Record Number:[id number] Patient Account Number: [id number] Date of Birth/Sex: Treating RN: [birth year] (49 y.o. Nancy Fetter Primary Care Emry Tobin: [name] Other Clinician: Referring Talma Aguillard: Treating Rashaan Wyles/Extender:[name], [name], [name] in Treatment: 49 Active Inactive Wound/Skin Impairment Nursing Diagnoses: Impaired tissue integrity Knowledge deficit related to ulceration/compromised skin integrity Goals: Patient/caregiver will verbalize understanding of skin care regimen Date Initiated: 09/03/2018 Target Resolution Date: 06/05/2019 Goal Status: Active Ulcer/skin breakdown will have a volume reduction of 30% by week 4 Date Initiated: 09/03/2018 Date Inactivated: 10/07/2018 Target Resolution Date: 10/01/2018 Unmet Goal Status: Unmet Reason: COMORBITIES Ulcer/skin breakdown will have a volume reduction of 50% by week 8 Date Initiated: 10/07/2018 Date Inactivated: 11/03/2018 Target Resolution Date: 10/31/2018 Unmet Goal Status: Unmet Reason: Osteomyelitis Interventions: Assess patient/caregiver ability to obtain necessary supplies Assess patient/caregiver ability to perform ulcer/skin care regimen upon admission and as needed Assess ulceration(s) every visit Provide education on ulcer  and skin care Treatment Activities: Skin care regimen initiated : 09/03/2018 Topical wound management initiated : 09/03/2018 Notes: Electronic Signature(s) Signed: 05/18/2019 6:14:31 PM By: [name] RN, BSN Entered By: [name] on 05/18/2019 13:38:41 -------------------------------------------------------------------------------- Pain Assessment Details Patient Name: Date of Service: [name] 05/18/2019 1:00 PM Medical Record Number:[id number] Patient Account Number: [id number] Date of Birth/Sex: Treating RN: 08-29-1970 (49 y.o. [name] Primary Care Sanaa Zilberman: [name] Other Clinician: Referring Angellynn Kimberlin: Treating Jarquavious Fentress/Extender:[name], [name], [name] in Treatment: 36 Active Problems Location of Pain Severity and Description of Pain Patient Has Paino No Site Locations Pain Management and Medication Current Pain Management: Electronic Signature(s) Signed: 05/20/2019 5:45:19 PM By: Carlene Coria RN Entered By: Carlene Coria on 05/18/2019 13:31:00 -------------------------------------------------------------------------------- Patient/Caregiver Education Details Patient Name: Date of Service: Sharp, Amberlyn L. 2/15/2021andnbsp1:00 PM Medical Record Number:[id number] Patient Account Number: [id number] Date of Birth/Gender: Treating RN: 1970/08/28 (49 y.o. Nancy Fetter Primary Care Physician: [name] Other Clinician: Referring Physician: Treating Physician/Extender:[name], [name], [name] in Treatment: 22 Education Assessment Education Provided To: Patient Education Topics Provided Wound/Skin Impairment: Methods: Explain/Verbal Responses: State content correctly Electronic Signature(s) Signed: 05/18/2019 6:14:31 PM By: [name] RN,  BSN Entered By: [name] on 05/18/2019 17:10:06 -------------------------------------------------------------------------------- Wound Assessment  Details Patient Name: Date of Service: [name], MALICKI 05/18/2019 1:00 PM Medical Record Number:[id number] Patient Account Number: [id number] Date of Birth/Sex: Treating RN: Feb 03, 1971 (49 y.o. Nancy Fetter Primary Care Stormi Vandevelde: [name] Other Clinician: Referring Rolla Kedzierski: Treating Aaren Krog/Extender:[name], [name], [name] in Treatment: 36 Wound Status Wound Number: 43 Primary Diabetic Wound/Ulcer of the Lower Extremity Etiology: Wound Location: Right Foot - Medial Wound Open Wounding Event: Gradually Appeared Status: Status: Date Acquired: 08/04/2018 Comorbid Cataracts, Chronic sinus problems/congestion, Weeks Of Treatment: 36 History: Anemia, Sleep Apnea, Deep Vein Thrombosis, Clustered Wound: Yes Hypertension, Peripheral Arterial Disease, Type I Diabetes, Osteoarthritis, Osteomyelitis, Neuropathy, Seizure Disorder Photos Wound Measurements Length: (cm) 3 Width: (cm) 2 Depth: (cm) 0.1 Clustered Quantity: 3 Area: (cm) 4.712 Volume: (cm) 0.471 Wound Description Classification: Grade 4 Wound Margin: Flat and Intact Exudate Amount: Medium Exudate Type: Serosanguineous Exudate Color: red, brown Wound Bed Granulation Amount: Large (67-100%) Granulation Quality: Red, Pink, Friable Necrotic Amount: Small (1-33%) Necrotic Quality: Adherent Slough ter Cleansing: No no Yes Exposed Structure ed: No ubcutaneous Tissue) Exposed: Yes ed: Yes ed: Yes s of Muscle: Yes d: No : No % Reduction in Area: 93.5% % Reduction in Volume: 97.8% Epithelialization: Large (67-100%) Tunneling: No Undermining: No Foul Odor Af Slough/Fibri Fascia Expos Fat Layer (S Tendon Expos Muscle Expos Necrosi Joint Expose Bone Exposed Treatment Notes Wound #43 (Right, Medial Foot) 1. Cleanse With Wound Cleanser 3. Primary Dressing Applied Calcium Alginate Ag 4. Secondary Dressing Dry [name] 5. Secured With Elastic bandage Notes ace wrap patient  request. Electronic Signature(s) Signed: 05/19/2019 4:25:10 PM By: [name] EMT/HBOT Signed: 05/28/2019 8:56:47 AM By: [name] RN, BSN Entered By: [name] on 05/19/2019 14:47:00 -------------------------------------------------------------------------------- Wound Assessment Details Patient Name: Date of Service: [name] 05/18/2019 1:00 PM Medical Record Number:[id number] Patient Account Number: [id number] Date of Birth/Sex: Treating RN: 01/02/1971 (49 y.o. Nancy Fetter Primary Care Keshaun Dubey: [name] Other Clinician: Referring Kyra Laffey: Treating Jozlyn Schatz/Extender:[name], [name], [name] in Treatment: 36 Wound Status Wound Number: 43 Primary Diabetic Wound/Ulcer of the Lower Extremity Etiology: Wound Location: Left Toe Fourth Wound Healed - Epithelialized Wounding Event: Trauma Status: Date Acquired: 04/17/2019 Comorbid Cataracts, Chronic sinus problems/congestion, Weeks Of Treatment: 4 History: Anemia, Sleep Apnea, Deep Vein Thrombosis, Clustered Wound: No Hypertension, Peripheral Arterial Disease, Type I Diabetes, Osteoarthritis, Osteomyelitis, Neuropathy, Seizure Disorder Photos Wound Measurements Length: (cm) 0 % Reduction Width: (cm) 0 % Reduction Depth: (cm) 0 Epitheliali Area: (cm) 0 Tunneling: Volume: (cm) 0 Underminin Wound Description Classification: Grade 1 Foul Odor A Wound Margin: Flat and Intact Slough/Fibr Exudate Amount: None Present Wound Bed Granulation Amount: None Present (0%) Necrotic Amount: None Present (0%) Fascia Expo Fat Layer ( Tendon Expo Muscle Expo Joint Expos Bone Expos fter Cleansing: No ino No Exposed Structure sed: No Subcutaneous Tissue) Exposed: No sed: No sed: No ed: No ed: No in Area: 100% in Volume: 100% zation: Large (67-100%) No g: No Electronic Signature(s) Signed: 05/19/2019 4:25:10 PM By: [name] EMT/HBOT Signed: 05/28/2019 8:56:47 AM By: [name] RN, BSN Previous Signature: 05/18/2019 6:14:31 PM Version By: [name] RN, BSN Entered By: [name] on 05/19/2019 14:47:34 -------------------------------------------------------------------------------- Vitals Details Patient Name: Date of Service: [name] 05/18/2019 1:00 PM Medical Record Number:[id number] Patient Account Number: [id number] Date of Birth/Sex: Treating RN: [birth year] (50 y.o. [name] Primary Care Aloha Bartok: [name] Other Clinician: Referring Tanasha Menees: Treating Kaedance Magos/Extender:[name], [name], [name] in  Treatment: 36 Vital Signs Time Taken: 13:30 Temperature (F): 98.2 Height (in): 67 Pulse (bpm): 92 Weight (lbs): 125 Respiratory Rate (breaths/min): 16 Body Mass Index (BMI): 19.6 Blood Pressure (mmHg): 171/75 Reference Range: 80 - 120 mg / dl Electronic Signature(s) Signed: 05/20/2019 5:45:19 PM By: Carlene Coria RN Entered By: Carlene Coria on 05/18/2019 13:30:50

## 2019-06-01 ENCOUNTER — Other Ambulatory Visit: Payer: Self-pay

## 2019-06-01 ENCOUNTER — Encounter (HOSPITAL_BASED_OUTPATIENT_CLINIC_OR_DEPARTMENT_OTHER): Payer: Medicare HMO | Attending: Internal Medicine | Admitting: Internal Medicine

## 2019-06-01 DIAGNOSIS — M1990 Unspecified osteoarthritis, unspecified site: Secondary | ICD-10-CM | POA: Insufficient documentation

## 2019-06-01 DIAGNOSIS — Z89431 Acquired absence of right foot: Secondary | ICD-10-CM | POA: Diagnosis not present

## 2019-06-01 DIAGNOSIS — N189 Chronic kidney disease, unspecified: Secondary | ICD-10-CM | POA: Diagnosis not present

## 2019-06-01 DIAGNOSIS — E1042 Type 1 diabetes mellitus with diabetic polyneuropathy: Secondary | ICD-10-CM | POA: Insufficient documentation

## 2019-06-01 DIAGNOSIS — E10610 Type 1 diabetes mellitus with diabetic neuropathic arthropathy: Secondary | ICD-10-CM | POA: Insufficient documentation

## 2019-06-01 DIAGNOSIS — M8580 Other specified disorders of bone density and structure, unspecified site: Secondary | ICD-10-CM | POA: Insufficient documentation

## 2019-06-01 DIAGNOSIS — E1022 Type 1 diabetes mellitus with diabetic chronic kidney disease: Secondary | ICD-10-CM | POA: Diagnosis not present

## 2019-06-01 DIAGNOSIS — Z881 Allergy status to other antibiotic agents status: Secondary | ICD-10-CM | POA: Insufficient documentation

## 2019-06-01 DIAGNOSIS — G40909 Epilepsy, unspecified, not intractable, without status epilepticus: Secondary | ICD-10-CM | POA: Diagnosis not present

## 2019-06-01 DIAGNOSIS — L97521 Non-pressure chronic ulcer of other part of left foot limited to breakdown of skin: Secondary | ICD-10-CM | POA: Diagnosis not present

## 2019-06-01 DIAGNOSIS — Z87891 Personal history of nicotine dependence: Secondary | ICD-10-CM | POA: Insufficient documentation

## 2019-06-01 DIAGNOSIS — M86671 Other chronic osteomyelitis, right ankle and foot: Secondary | ICD-10-CM | POA: Diagnosis not present

## 2019-06-01 DIAGNOSIS — G4730 Sleep apnea, unspecified: Secondary | ICD-10-CM | POA: Diagnosis not present

## 2019-06-01 DIAGNOSIS — E10621 Type 1 diabetes mellitus with foot ulcer: Secondary | ICD-10-CM | POA: Diagnosis not present

## 2019-06-01 DIAGNOSIS — E1069 Type 1 diabetes mellitus with other specified complication: Secondary | ICD-10-CM | POA: Insufficient documentation

## 2019-06-01 DIAGNOSIS — Z9641 Presence of insulin pump (external) (internal): Secondary | ICD-10-CM | POA: Insufficient documentation

## 2019-06-01 DIAGNOSIS — I129 Hypertensive chronic kidney disease with stage 1 through stage 4 chronic kidney disease, or unspecified chronic kidney disease: Secondary | ICD-10-CM | POA: Diagnosis not present

## 2019-06-01 DIAGNOSIS — Z1624 Resistance to multiple antibiotics: Secondary | ICD-10-CM | POA: Diagnosis not present

## 2019-06-01 DIAGNOSIS — Z794 Long term (current) use of insulin: Secondary | ICD-10-CM | POA: Insufficient documentation

## 2019-06-01 DIAGNOSIS — L97514 Non-pressure chronic ulcer of other part of right foot with necrosis of bone: Secondary | ICD-10-CM | POA: Diagnosis not present

## 2019-06-01 DIAGNOSIS — Z86718 Personal history of other venous thrombosis and embolism: Secondary | ICD-10-CM | POA: Insufficient documentation

## 2019-06-01 DIAGNOSIS — E1051 Type 1 diabetes mellitus with diabetic peripheral angiopathy without gangrene: Secondary | ICD-10-CM | POA: Insufficient documentation

## 2019-06-01 DIAGNOSIS — Z992 Dependence on renal dialysis: Secondary | ICD-10-CM | POA: Diagnosis not present

## 2019-06-01 NOTE — Telephone Encounter (Signed)
Received call from [facility] Rep in regards to this pt. He stated that after [employer] investigated the issue that patient has been having with getting dexcom system. Rep stated that her [facility] was correct that they cannot provide the dexcom for her because the contract that the pt's insurance has with dexcom, however, the pt can still use a DME supplier, and [employer] and [facility] are going to work together today and find out which DME supplier that the pt can use to get these supplies. The pt will receive a call from them today with this information. Called pt and notified her of this and she verbalized understanding that she would receive a call today and she should answer it.

## 2019-06-02 ENCOUNTER — Encounter (HOSPITAL_COMMUNITY): Payer: Medicare HMO

## 2019-06-02 ENCOUNTER — Other Ambulatory Visit: Payer: Self-pay | Admitting: *Deleted

## 2019-06-02 DIAGNOSIS — N185 Chronic kidney disease, stage 5: Secondary | ICD-10-CM

## 2019-06-02 NOTE — Progress Notes (Signed)
ADRIENE, KNIPFER ([id number]) Visit Report for 06/01/2019 HPI Details Patient Name: Date of Service: [name], [name] 06/01/2019 1:00 PM Medical Record Number:[id number] Patient Account Number: [id number] Date of Birth/Sex: Treating RN: 05/08/1970 (49 y.o. Nancy Fetter Primary Care Provider: [name] Other Clinician: Referring Provider: Treating Provider/Extender:Mesiah Manzo, [name], [name] in Treatment: 7 History of Present Illness HPI Description: 49 year old diabetic who is known to have type 1 diabetes which is poorly controlled last hemoglobin A1c was 11%. She comes in with a ulcerated area on the left lateral foot which has been there for over 6 months. Was recently she has been treated by [name] of podiatry who saw her last on 05/28/2016. Review of his notes revealed that the patient had incision and drainage with placement of antibiotic beads to the left foot on 04/11/2016 for possible osteomyelitis of the cuboid bone. Over the last year she's had a history of amputation of the left fifth toe and a femoropopliteal popliteal bypass graft somewhere in April 2017. 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 His note [name] mentions that the patient has been referred to me for further wound care and possibly great candidate for hyperbaric oxygen therapy due to recurrent osteomyelitis. However we do not have any x-rays of biopsy reports confirming this. He has been on several antibiotics including Bactrim and most recently is on doxycycline for an MRSA. I understand, the patient was not a candidate for IV antibiotics as she has had previous PICC lines which resulted in blood clots in both arms. There was a x-ray report dated 04/04/2016 on [name] notes which showed evidence of fifth ray resection left foot with osteolytic changes noted to the fourth metatarsal and cuboid bone on the left. 06/13/2016 -- had a left foot x-ray which showed no  acute fracture or dislocation and no definite radiographic evidence of osteomyelitis. Advanced osteopenia was seen. 06/20/2016 -- she has noticed a new wound on the right plantar foot in the region where she had a callus before. 06/27/16- the patient did have her x-ray of the right foot which showed no findings to suggest osteomyelitis. She saw her endocrinologist, [name], yesterday. Her A1c in January was 11. He also indicates mismanagement and noncompliance regarding her diabetes. She is currently on Bactrim for a lip infection. She is complaining of nausea, vomiting and diarrhea. She is unable to articulate the exact orders or dosing of the Bactrim; it is unclear when she will complete this. 07/04/2016 -- results from [facility] of ABIs with ankle waveforms were noted from 02/14/2016. The examination done on 06/27/2015 showed noncompressible ABIs with the right being 1.45 and the left being 1.33. The present examination showed a right ABI of 1.19 on the left of 1.33. The conclusion was that right normal ABI in the lower extremity at rest however compared to previous study which was noncompressible ABI may be falsely elevated side suggesting medial calcification. The left ABI suggested medial calcification. 08/01/2016 -- the patient had more redness and pain on her right foot and did not get to come to see as noted she see her PCP or go to the ER and decided to take some leftover metronidazole which she had at home. As usual, the patient does report she feels and is rather noncompliant. 08/08/2016 -- -- x-ray of the right foot -- FINDINGS:Transmetatarsal amputation is noted. No bony destruction is noted to suggest osteomyelitis. IMPRESSION: No evidence of osteomyelitis. Postsurgical changes are seen. MRI would be more sensitive  for possible bony changes. Culture has grown Serratia Marcescens -- sensitive to Bactrim, ciprofloxacin, ceftazidime she was seen by [name] on 08/06/2016.  He did not find any exposed bone, muscle, tendon, ligament or joint. There was no malodor and he did a excisional debridement in the office. ============ Old notes: 49 year old patient who is known to the wound clinic for a while had been away from the wound clinic since 09/01/2014. Over the last several months she has been admitted to various hospitals including [facility] at [facility]. She was treated for a right metatarsal osteomyelitis with a transmetatarsal amputation and this was done about 2 months ago. He has a small ulcerated area on the right heel and she continues to have an ulcerated area on the left plantar aspect of the foot. The patient was recently admitted to the [facility] hospital group between 7/12 and 10/18/2014. she was given 3 weeks of IV vancomycin and was to follow-up with her surgeons at [facility] and also took oral vancomycin for C. difficile colitis. 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 MRI of the left foot done on 10/14/2014 showed no abscess or osteomyelitis. 04/27/15; this is a patient we know from previous stays in the wound care center. She is a type I diabetic I am not sure of her control currently. Since the last time I saw her she is had a right transmetatarsal amputation and has no wounds on her right foot and has no open wounds. She is been followed at the wound care center at [facility] in [place]. She comes today with the desire to undergo hyperbaric treatment locally. Apparently one of her wound care providers in [place] has suggested hyperbarics. This is in response to an MRI from 04/18/15 that showed increased marrow signal and loss of the proximal fifth  metatarsal cortex evidence of osteomyelitis with likely early osteomyelitis in the cuboid bone as well. She has a large wound over the base of the fifth metatarsal. She also has a eschar over her the tips of her toes on 1,3 and 5. She does not have peripheral pulses and apparently is going for an angiogram tomorrow which seems reasonable. After this she is going to infectious disease at [facility]. They have been using Medihoney to the large wound on the lateral aspect of the left foot to. The patient has known Charcot deformity from diabetic neuropathy. She also has known diabetic PAD. Surprisingly I can't see that she has had any recent antibiotics, the patient states the last antibiotic she had was at the end of November for 10 days. I think this was in response to culture that showed group G strep although I'm not exactly sure where the culture was from. She is also had arterial studies on 03/29/15. This showed a right ABI of 1.4 that was noncompressible. Her left ABI was 0.73. 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 It was not felt that arterial inflow was adequate for healing of a foot ulcer. Her Doppler waveforms looked monophasic ===== READMISSION 02/28/17; this is in an now 49 year old woman we've had at several different occasions in this clinic. 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 She has a remote ex-smoker. She was last seen in this  clinic by Dr. Con Memos I think in May. More recently she is been followed by her podiatrist [name] an infectious disease Dr. Megan Salon. She has 2 open wounds the major one is over the right first metatarsal head she also has a wound on the left plantar foot. an MRI of the right foot on 01/01/17 showed a soft tissue ulcer along the plantar aspect of the first metatarsal base consistent with osteomyelitis of the first metatarsal stump. Dr. 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 According to the patient this is been open for slightly over a month. She has been on a combination of Cipro 500 twice a day, Zyvox 600 twice a day and Flagyl 500 3 times a day for over a month now as directed by Dr. Megan Salon. cultures of the right foot earlier this year showed MRSA in January and Serratia in May. January also had a few viridans strep. Recent x-rays of both feet were done and [name] office and I don't have these reports. The patient has known PAD and has a history of aleft femoropopliteal bypass in April 2017. 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 Dwyane Dee. In spite of this the last hemoglobin A1c I can see is 10.1 on 01/01/2017. She is being referred by 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 She is been using Medihoney to this area and also an area on the left midfoot. She is using healing sandals bilaterally. ABIs in this clinic at the left posterior tibial was 1.1 noncompressible on the right READMISSION Non invasive vascular NOVANT 5/18 Aftercare following surgery of the circulatory system Procedure Note - Interface, External Ris In - 08/13/2016 11:05 AM EDT Procedure: Examination consists of physiologic resting arterial pressures of the brachial and ankle arteries bilaterally with continuous wave Doppler waveform analysis. Previous: Previous exam performed on 02/14/16 demonstrated ABIs of Rt = 1.19 and Lt = 1.33. Right: ABI = non-compressible PT, 1.47 DP. S/P transmet amputation. Left: ABI = 1.52, 2nd digit pressure = 87 mmHg Conclusions: Right: ABI (>1.3) may be falsely elevated, suggesting medial calcification. Left: ABI (>1.3) may be falsely  elevated, suggesting medial calcification The patient is a now 49 year old type I diabetic is had multiple issues her graded to chronic diabetic foot ulcers. 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 We had her in the clinic lastin November. At that point she had wounds on her bilateral feet.she had wanted to try hyperbarics however the healogics review process denied her because she hadn't followed up with her vascular surgeon for her left femoropopliteal bypass. The bypass was done by Dr. Raul Del at [facility]. We made her a follow-up with Dr. Raul Del however she did not keep the appointment and therefore she was not approved The patient shows me a small wound on her left fourth metatarsal head on her phone. She developed rapid discoloration in the plantar aspect of the left foot and she was admitted to hospital from 2/2 through 05/10/17 with wet gangrene of the left foot osteomyelitis of the fourth metatarsal heads. She was admitted acutely ill with a temperature of 103. She was started on broad-spectrum vancomycin and cefepime. On 05/06/17  she was taken to the OR by [name] her podiatric surgeon for an incision and drainage irrigation of the left foot wound. Cultures from this surgery revealed group be strep and anaerobes. she was seen by Dr.Xu of orthopedic surgery and scheduled for a below-knee amputation which she u refused. Ultimately she was discharged on Levaquin and Flagyl for one month. MRI 05/05/17 done while she was in the hospital showed abscess adjacent to the fourth metatarsal head and neck small abscess around the fourth flexor tendon. Inflammatory phlegmon and gas in the soft tissues along the lateral aspect of the fourth phalanx. Findings worrisome for osteomyelitis involving the fourth proximal and middle phalanx and also the third and fourth metatarsals. Finally the patient had actually shortly  before this followed up with Dr. Raul Del at no time on 04/29/17. He felt that her left femoropopliteal bypass was patent he felt that her left-sided toe pressures more than adequate for healing a wound on the left foot. This was before her acute presentation. 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 05/28/17; she is started hyperbarics. The patient tells me that for some reason she was not actually on Levaquin but I think on ciprofloxacin. She was on Flagyl. She only started her Levaquin yesterday due to some difficulty with the pharmacy and perhaps her sister picking it up. She has an appointment with [name] tomorrow and with infectious disease early next week. She has no new complaints 06/06/17; the patient continues in hyperbarics. She saw [name] on 05/29/17 who is her podiatric surgeon. He is elected for a transmetatarsal amputation on 06/27/17. I'm not sure at what level he plans to do this amputation. The patient is unaware She also saw Dr. Megan Salon of infectious disease who elected to continue her on current antibiotics I think this is ciprofloxacin and Flagyl. I'll need to clarify with her tomorrow if she actually has this. We're using silver alginate to the actual wound. Necrotic surface today with material under the flap of her foot. Original MRI showed abscesses as well as osteomyelitis of the proximal and middle fourth phalanx and the third and fourth metatarsal heads 06/11/17; patient continues in hyperbarics and continues on oral antibiotics. She is doing well. The wound looks better. The necrotic part of this under the flap in her superior foot also looks better. she is been to see [name]. I haven't had a chance to look at his note. Apparently he has put the transmetatarsal amputation on hold her request it is still planning to take her to the OR for debridement and product application ACEL. I'll see if I can find his note. I'll therefore leave product ordering/requests to Dr.  [name] for now. I was going to look at Dermagraft 06/18/17-she is here in follow-up evaluation for bilateral foot wounds. She continues with hyperbaric therapy. She states she has been applying manuka honey to the right plantar foot and alternate manuka honey and silver alginate to the left foot, despite our orders. We will continue with same treatment plan and she will follo up next week. 06/25/17; I have reviewed [name] last note from 3/11. She has operative debridement in 2 days' time. By review his note apparently they're going to place there is skin over the majority of this wound which is a good choice. She has a small satellite area at the most proximal part of this wound on the left plantar foot. The area on the right plantar foot we've been using silver alginate and it is  close to healing. 07/02/17; unfortunately the patient was not easily approved for [name] proposed surgery. I'm not completely certain what the issue is. She has been using silver alginate to the wound she has completed a first course of hyperbarics. She is still on Levaquin and Flagyl. I have really lost track of the time course here.I suspect she should have another week to 2 of antibiotics. I'll need to see if she is followed up with infectious disease Dr. Megan Salon 07/09/17; the patient is followed up with Dr. Megan Salon. 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 She continues on Levaquin and metronidazole continuing both of these for now I think she is been on fr about 6 weeks. She still has some drainage but no pain. No fever. Her had been plans for her to go to the OR for operative debridement with her podiatrist [name], I am not exactly sure where that is. I'll probably slip a note to [name] today. I note that she follows with Dr. Dwyane Dee of endocrinology. We have her recertified for hyperbaric oxygen. I have not heard about Dermagraft however I'll see if [name] is  planning a skin substitute as well 07/16/17; the patient tells me she is just about out of [place]. I'll need to check Dr. Hale Bogus last notes on this. She states she has plenty of Flagyl however. She comes in today complaining of pain in the right lateral foot which she said lasted for about a day. The wound on the right foot is actually much more medially. She also tells me that the [facility] cost a lot of pain in the left foot wound and she turned back to silver alginate. Finally Dermagraft has a $016 per application co-pay. She cannot afford this 07/23/17; patient arrives today with the wound not much smaller. There is not much new to add. She has not heard from [name] all try to put in a call to them today. She was asking about Dermagraft again and she has an over $010 per application co-pay she states that she would be willing to try to do a payment plan. I been tried to avoid this. We've been using silver alginate, I'll change to [facility] 07/30/17-She is here in follow-up evaluation for left foot ulcer. She continues hyperbaric medicine. The left foot ulcer is stable we will continue with same treatment plan 08/06/17; she is here for evaluation of her left foot ulcer. Currently being treated for hyperbarics or underlying osteomyelitis. She is completed antibiotics. The left foot ulcer is better smaller with healthier looking granulation. For various reasons I am not really clear on we never got her back to the OR with [name]. He did not respond to my secure text message. Nevertheless I think that surgery on this point is not necessary nor am I completely clear that a skin substitute is necessary The patient is complaining about pain on the outside of her right foot. 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 There is no erythema. She also states the foot is warm versus her other part of her upper leg and this is largely true. It is not totally clear to me what's  causing this. She thinks it's different from her usual neuropathy pain 08/13/17; she arrives in clinic today with a small wound which is superficial on her right first metatarsal head. 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 She tells [place] she was up on  her feet over the Mother's Day celebration. The large wound is on the left foot. Continues with hyperbarics for underlying osteomyelitis. We're using Hydrofera Blue. She asked me today about where we were with Dermagraft. I had actually excluded this because of the co- pay however she wants to assume this therefore I'll recheck the co-pay an order for next week. 08/20/17; the patient agreed to accept the co-pay of the first Apligraf which we applied today. She is disappointed she is finishing hyperbarics will run this through the insurance on the extent of the foot infection and the extent of the wound that she had however she is already had 60 dive's. Dermagraft No. 1 08/27/17; Dermagraft No. 2. She is not eligible for any more hyperbaric treatments this month. She reports a fair amount of drainage and she actually changed to the external dressings without disturbing the direct contact layer 09/03/17; the patient arrived in clinic today with the wound superficially looking quite healthy. Nice vibrant red tissue with some advancing epithelialization although not as much adherence of the flap as I might like. However she noted on her own fourth toe some bogginess and she brought that to our attention. Indeed this was boggy feeling like a possibility of subcutaneous fluid. 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 She is not been unwell. We've been using Dermagraft 09/10/17; the culture that I did not last week was MRSA. She saw Dr. Megan Salon this morning who is going to start her on vancomycin. I had sent him a secure a text message yesterday. I also spoke with  her podiatric surgeon [name] about surgery on this foot the options for conserving a functional foot etc. Promised me he would see her and will make back consultation today. Paradoxically her actual wound on the plantar aspect of her left foot looks really quite good. I had given her 5 days worth of Baxdella to cover her for MRSA. Her MRI came back showing osteomyelitis within the third metatarsal shaft and head and base of the third and fourth proximal phalanx. She had extensive inflammatory changes throughout the soft tissue of the lateral forefoot. With an ill-defined fluid around the fourth metatarsal extending into the plantar and dorsal soft tissues 09/19/17; the patient is actually on oral Septra and Flagyl. She apparently refused IV vancomycin. She also saw [name] at my request who is planning her for a left BKA sometime in mid July. MRI showed osteomyelitis within the third metatarsal shaft and head and the basis of the third and fourth proximal phalanx. I believe there was felt to be possible septic arthritis involving the third MTP. 09/26/17; the patient went back to Dr. Megan Salon at my suggestion and is now receiving IV daptomycin. Her wound continues to look quite good making the decision to proceed with a transmetatarsal amputation although more difficult for the patient. I believe in my extensive discussions with her she has a good sense of the pros and cons of this. I don't NV the tuft decision she has to make. She has an appointment with [name] I believe in mid July and I previously spoken to him about this issue Has we had used 3 previous Dermagraft. Given the condition of the wound surface I went ahead and added the fourth one today area and I did this not fully realizing that she'll be traveling to [place] next week. I'm hopeful she can come  back in 2 weeks 10/21/17; Her same Dermagraft on for about 3-1/2 weeks. In spite of this the wound arrives looking quite  healthy. There is been a lot of healing dimensions are smaller. Looking at the square shaped wound she has now there is some undermining and some depth medially under the undermining although I cannot palpate any bone. No surrounding infection is obvious. She has difficult questions about how to look at this going forward vis--vis amputations versus continued medical therapy. To be truthful the wound is looks so healthy and it is continued to contract. Hard to justify foot surgery at this point although I still told her that I think it might come to that if we are not able to eradicate the underlying MRSA. She is still highly at risk and she understands this 11/06/17 on evaluation today patient appears to be doing better in regard to her foot ulcer. She's been tolerating the dressing changes without complication. Currently she is here for her Dermagraft #6. Her wound continues to make excellent progress at this point. She does not appear to have any evidence of infection which is good news. 11/13/17 on evaluation today patient appears to be doing excellent at this time. She is here for repeat Dermagraft application. This is #7. Overall her wound seems to be making great progress. 12/05/17; the patient arrives with the wound in much better condition than when I last saw this almost 6 weeks ago. She still has a small probing area in the left metatarsal head region on the lateral aspect of her foot. We applied her last Dermagraft today. Since the last time she is here she has what appears to a been a blood blister on the plantar aspect of left foot although I don't see this is threatening. There is also a thick raised tissue on the right mid metatarsal head region. This was not there I don't think the last time she was here 3 weeks ago. 12/12/17; the patient continues to have a small programming area in the left metatarsal head region on the lateral aspect of her foot which was the initial large surgical  wound. I applied her last Apligraf last week. I'm going to use Endoform starting today Unfortunately she has an excoriated area in the left mid foot and the right mid foot. The left midfoot looks like a blistered area this was not opened last week it certainly is open today. Using silver alginate on these areas. She promises me she is offloading this. 12/19/17; the small probing area in the left metatarsal head eyes think is shallower. In general her original wound looks better. We've been using Endoform. The area inferiorly that I think was trauma last week still requires debridement a lot of nonviable surface which I removed. She still has an open open area distally in her foot Similarly on the right foot there is tightly adherent surface debris which I removed. Still areas that don't look completely epithelialized. This is a small open area. We used silver alginate on these areas 12/26/2017; the patient did not have the supplies we ordered from last week including the Endoform. The original large wound on the left lateral foot looks healthy. She still has the undermining area that is largely unchanged from last week. She has the same heavily callused raised edged wounds on the right mid and left midfoot. Both of these requiring debridement. We have been using silver alginate on these areas 01/02/2018; there is still supply issues. We are going to try  to use Prisma but I am not sure she actually got it from what she is saying. She has a new open area on the lateral aspect of the left fourth toe [previous fifth ray amputation]. Still the one tunneling area over the fourth metatarsal head. The area is in the midfoot bilaterally still have thick callus around them. She is concerned about a raised swelling on the lateral aspect of the foot. However she is completely insensate 01/10/2018; we are using Prisma to the wounds on her bilateral feet. Surprisingly the tunneling area over the left fourth  metatarsal head that was part of her original surgery has closed down. She has a small open area remaining on the incision line. 2 open areas in the midfoot. 02/10/2018; the patient arrives back in clinic after a month hiatus. She was traveling to visit family in [place]. Is fairly clear she was not offloading the areas on her feet. The original wound over the left lateral foot at the level of metatarsal heads is reopened and probes medially by about a centimeter or 2. She notes that a week ago she had purulent drainage come out of an area on the left midfoot. Paradoxically the worst area is actually on the right foot is extensive with purulent drainage. We will use silver alginate today 02/17/2018; the patient has 3 wounds one over the left lateral foot. She still has a small area over the metatarsal heads which is the remnant of her original surgical wound. This has medial probing depth of roughly 1.4 cm somewhat better than last week. The area on the right foot is larger. We have been using silver alginate to all areas. The area on the right foot and left foot that we cultured last week showed both Klebsiella and Proteus. Both of these are quinolone sensitive. The patient put her's self on Bactrim and Flagyl that she had left hanging around from prior antibiotic usages. She was apparently on this last week when she arrived. I did not realize this. Unfortunately the Bactrim will not cover either 1 of these organisms. We will send in Cipro 500 twice daily for a week 03/04/2018; the patient has 2 wounds on the left foot one is the original wound which was a surgical wound for a deep DFU. At one point this had exposed bone. She still has an area over the fourth metatarsal head that probes about 1.4 cm although I think this is better than last week. I been using silver nitrate to try and promote tissue adherence and been using silver alginate here. She also has an area in the left midfoot. This has  some depth but a small linear wound. Still requiring debridement. On the right midfoot is a circular wound. A lot of thick callus around this area. We have been using silver alginate to all wound areas She is completed the ciprofloxacin I gave her 2 weeks ago. 03/11/2018; the patient continues to have 2 open areas on the left foot 1 of which was the original surgical wound for a deep DFU. Only a small probing area remains although this is not much different from last week we have been using silver alginate. The other area is on the midfoot this is smaller linear but still with some depth. We have been using silver alginate here as well On the right foot she has a small circular wound in the mid aspect. This is not much smaller than last time. We have been using silver alginate here as well  03/18/2018; she has 3 wounds on the left foot the original surgical wound, a very superficial wound in the mid aspect and then finally the area in the mid plantar foot. She arrives in today with a very concerning area in the wound in the mid plantar foot which is her most proximal wound. There is undermining here of roughly 1-1/2 cm superiorly. Serosanguineous drainage. She tells me she had some pain on for over the weekend that shot up her foot into her thigh and she tells me that she had a nodule in the groin area. She has the single wound in the right foot. We are using endoform to both wound areas 03/24/2018; the patient arrives with the original surgical wound in the area on the left midfoot about the same as last week. There is a collection of fluid under the surface of the skin extending from the surgical wound towards the midfoot although it does not reach the midfoot wound. The area on the right foot is about the same. Cultures from last week of the left midfoot wound showed abundant Klebsiella abundant Enterococcus faecalis and moderate methicillin resistant staph I gave her Levaquin but this would  have only covered the Klebsiella. She will need linezolid 04/01/2018; she is taking linezolid but for the first few days only took 1 a day. I have advised her to finish this at twice daily dosing. In any case all of her wounds are a lot better especially on the left foot. The original surgical wound is closed. The area on the left midfoot considerably smaller. The area on the right foot also smaller. 04/08/2018; her original surgical wound/osteomyelitis on the left foot remains closed. She has area on the left foot that is in the midfoot area but she had some streaking towards this. This is not connected with her original wound at least not visually. Small wound on the right midfoot appears somewhat smaller. 04/15/18; both wounds looks better. Original wound is better left midfoot. Using silver alginate 1/21; patient states she uses saltwater soak in, stones or remove callus from around her wounds. She is also concerned about a blood blister she had on the left foot but it simply resolved on its own. We've been using silver alginate 1/28; the patient arrives today with the same streaking area from her metatarsals laterally [the site of her original surgical wound] down to the middle of her foot. There is some drainage in the subcutaneous area here. This concerns me that there is actually continued ongoing infection in the metatarsals probably the fourth and third. This fixates an MRI of the foot without contrast [chronic renal failure] The wound in the mid part of the foot is small but I wonder whether this area actually connects with the more distal foot. The area on the right midfoot is probably about the same. Callus thick skin around the small wound which I removed with a curette we have been using silver alginate on both wound areas 2/4; culture I did of the draining site on the left foot last time grew methicillin sensitive staph aureus. MRI of the left foot showed interval resolution of the  findings surrounding the third metatarsal joint on the prior study consistent with treated osteomyelitis. Chronic soft tissue ulceration in the plantar and lateral aspect of the forefoot without residual focal fluid collection. No evidence of recurrent osteomyelitis. Noted to have the previous amputation of the distal first phalanx and fifth ray MRI of the right foot showed no evidence of  osteomyelitis I am going to treat the patient with a prolonged course of antibiotics directed against MSSA in the left foot 2/11; patient continues on cephalexin. She tells me she had nausea and vomiting over the weekend and missed 2 days. In general her foot looks much the same. She has a small open area just below the left fourth metatarsal head. A linear area in the left midfoot. Some discoloration extending from the inferior part of this into the left lateral foot although this appears to be superficial. She has a small area on the right midfoot which generally looks smaller after debridement 2/18; the patient is completing his cephalexin and has another 2 days. She continues to have open areas on the left and right foot. 2/25; she is now off antibiotics. The area on the left foot at the site of her original surgical wound has closed yet again. She still has open areas in the mid part of her foot however these appear smaller. The area on the right mid foot looks about the same. We have been using silver alginate She tells me she had a serious hypoglycemic spell at home. She had to have EMS called and get IV dextrose 3/3; disappointing on the left lateral foot large area of necrotic tissue surrounding the linear area. This appears to track up towards the same original surgical wound. Required extensive debridement. The area on the right plantar foot is not a lot better also using silver 3/12; the culture I did last time showed abundant enterococcus. I have prescribed Augmentin, should cover any unrecognized  anaerobes as well. In addition there were a few MRSA and Serratia that would not be well covered although I did not want to give her multiple antibiotics. She comes in today with a new wound in the right midfoot this is not connected with the original wound over her MTP a lot of thick callus tissue around both wounds but once again she said she is not walking on these areas 3/17-Patient comes in for follow-up on the bilateral plantar wounds, the right midfoot and the left plantar wound. Both these are heavily callused surrounding the wounds. We are continuing to use silver alginate, she is compliant with offloading and states she uses a wheelchair fairly often at home 3/24; both wound areas have thick callus. However things actually look quite a bit better here for the majority of her left foot and the right foot. 3/31; patient continues to have thick callused somewhat irritated looking tissue around the wounds which individually are fairly superficial. There is no evidence of surrounding infection. We have been using silver alginate however I change that to [facility] today 4/17; patient returns to clinic after having a scare with Covid she tested negative in her primary doctor's office. She has been using Hydrofera Blue. She does not have an open area on the right foot. On the left foot she has a small open area with the mid area not completely viable. She showed me pictures of what looks like a hemorrhagic blister from several days ago but that seems to have healed over this was on the lateral left foot 4/21; patient comes in to clinic with both her wounds on her feet closed. However over the weekend she started having pain in her right foot and leg up into the thigh. She felt as though she was running a low-grade fever but did not take her temperature. She took a doxycycline that she had leftover and yesterday a single Septra  and metronidazole. She thinks things feel somewhat  better. 4/28; duplex ultrasound I ordered last week was negative for DVT or superficial thrombophlebitis. She is completed the doxycycline I gave her. States she is still having a lot of pain in the right calf and right ankle which is no better than last week. She cannot sleep. She also states she has a temperature of up to 101, coughing and complaining of visual loss in her bilateral eyes. Apparently she was tested for Covid 2 weeks ago at [facility] and that was negative. Readmission: 09/03/18 patient presents back for reevaluation after having been evaluated at the end of April regarding erythema and swelling of her right lower extremity. Subsequently she ended up going to the hospital on 07/29/18 and was admitted not to be discharged until 08/08/18. Unfortunately it was noted during the time that she was in the hospital that she did have methicillin-resistant Staphylococcus aureus as the infection noted at the site. It was also determined that she did have osteomyelitis which appears to be fairly significant. She was treated with vancomycin and in fact is still on IV vancomycin at dialysis currently. This is actually slated to continue until 09/12/18 at least which will be the completion of the six weeks of therapy. Nonetheless based on what I'm seeing at this point I'm not sure she will be anywhere near ready to discontinue antibiotics at that time. Since she was released from the hospital she was seen by [name] who is her podiatrist on 08/27/18. His note specifically states that he is recommended that the patient needs of one knee amputation on the right as she has a life-threatening situation that can lead quickly to sepsis. The patient advised she would like to try to save her leg to which [name] apparently told her that this was against all medical advice. She also want to discontinue the Wound VAC which had been initiated due to the fact that she wasn't pleased with how the wound was looking  and subsequently she wanted to pursue applying Medihoney at that time. He stated that he did not believe that the right lower extremity was salvageable and that the patient understood but would still like to attempt hyperbaric option therapy if it could be of any benefit. She was therefore referred back to [place] for further evaluation. He plans to see her back next week. Upon inspection today patient has a significant amount purulent drainage noted from the wound at this point. The bone in the distal portion of her foot also appears to be extremely necrotic and spongy. When I push down on the bone it bubbles and seeps purulent drainage from deeper in the end of the foot. I do not think that this is likely going to heal very well at all and less aggressive surgical debridement were undertaken more than what I believe we can likely do here in our office. 09/12/2018; I have not seen this patient since the most recent hospitalization although she was in our clinic last week. I have reviewed some of her records from a complex hospitalization. 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 There is situation was complicated by MRSA bacteremia and acute on chronic renal failure now on dialysis. She is receiving vancomycin at dialysis. We started her on Dakin's wet-to- dry last week she is changing this daily. There is still purulent drainage coming out of her foot. Although she is apparently "agreeable" to a below-knee amputation which  is been suggested by multiple clinicians she wants this to be done in [place]. She apparently has a telehealth visit with that provider sometime in late [place] 6/24. I have told her I think this is probably too long. Nevertheless I could not convince her to allow a local doctor to perform BKA. 09/19/2018; the patient has a large necrotic area on the right anterior foot. She has had previous transmetatarsal amputations. Culture I did  last week showed MRSA nothing else she is on vancomycin at dialysis. She has continued leaking purulent drainage out of the distal part of the large circular wound on the right anterior foot. She apparently went to see Dr. 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 Somehow that translated into her being referred to plastic surgery for debridement of the area. I gather she basically refused amputation although I do not have a copy of Dr. Berenice Primas notes. The patient really wants to have a trial of hyperbaric oxygen. I agreed with initial assessment in this clinic that this was probably too far along to benefit however if she is going to have plastic surgery I think she would benefit from ancillary hyperbaric oxygen. The issue here is that the patient has benefited as maximally as any patient I have ever seen from hyperbaric oxygen therapy. Most recently she had exposed bone on the lateral part of her left foot after a surgical procedure and that actually has closed. She has eschared areas in both heels but no open area. She is remained systemically well. I am not optimistic that anything can be done about this but the patient is very clear that she wants an attempt. The attempt would include a wound VAC further debridements and hyperbaric oxygen along with IV antibiotics. 6/26; I put her in for a trial of hyperbaric oxygen only because of the dramatic response she has had with wounds on her left midfoot earlier this year which was a surgical wound that went straight to her bone over the metatarsal heads and also remotely the left third toe. We will see if we can get this through our review process and insurance. She arrives in clinic with again purulent material pouring out of necrotic bone on the top of the foot distally. There is also some concerning erythema on the front of the leg that we marked. It is bit difficult to tell how tender this is because of neuropathy. I  note from infectious disease that she had her vancomycin extended. 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 She had the wound VAC on for part of the week. The rest of the time she is putting various things on this including Medihoney, "ionized water" silver sorb gel etc. 7/7; follow-up along with HBO. She is still on vancomycin at dialysis. She has a large open area on the dorsal right foot and a small dark eschar area on her heel. There is a lot less erythema in the area and a lot less tenderness. From an infection point of view I think this is better. She still has a lot of necrosis in the remaining right forefoot [previous TMA] we are still using the wound VAC in this area 7/16; follow-up along with HBO. I put her on linezolid after she finished her vancomycin. We started this last Friday I gave her 2 weeks worth. I had the expectation that she would be operatively debrided by Dr. Marla Roe but that still has not happened yet. Patient phoned the  office this week. She arrives for review today after HBO. The distal part of this wound is completely necrotic. Nonviable pieces of tendon bone was still purulent drainage. Also concerning that she has black eschar over the heel that is expanding. I think this may be indicative of infection in this area as well. She has less erythema and warmth in the ankle and calf but still an abnormal exam 7/21 follow-up along with HBO. I will renew her linezolid after checking a CBC with differential monitoring her blood counts especially her platelets. She was supposed to have surgery yesterday but if I am reading things correctly this was canceled after her blood sugar was found to be over 500. I thought Dr. Marla Roe who called me said that they were sending her to the ER but the patient states that was not the case. 7/28. Follow-up along with HBO. She is on linezolid I still do not have any lab work from dialysis even though  I called last week. The patient is concerned about an area on her left lateral foot about the level of the base of her fifth metatarsal. I did not really see anything that ominous here however this patient is in [place] ability to point out problems that she is sensing and she has been accurate in the past Finally she received a call from Dr. Marla Roe who is referring her to another orthopedic surgeon stating that she is too booked up to take her to the operating room now. Was still using a wound VAC on the foot 8/3 -Follow-up after HBO, she is got another week of linezolid, she is to call ID for an appointment, x-rays of both feet were reviewed, the left foot x-ray with third MTP joint osteo- Right foot x-ray widespread osteo-in the right midfoot Right ankle x-ray does not show any active evidence of infection 8/11-Patient is seen after HBO, the wounds on the right foot appear to be about the same, the heel wound had some necrotic base over tendon that was debrided with a curette 8/21; patient is seen after HBO. The patient's wound on her dorsal foot actually looks reasonably good and there is substantial amount of epithelialization however the open area distally still has a lot of necrotic debris partially bone. I cannot really get a good sense of just how deep this probes under the foot. She has been pressuring me this week to order medical maggots through a company in [place] for her. The problem I have is there is not a defined wound area here. On the positive side there is no purulence. She has been to see infectious disease she is still on Septra DS although I have not had a chance to review their notes 8/28; patient is seen in conjunction with HBO. The wounds on her foot continued to improve including the right dorsal foot substantially the, the distal part of this wound and the area on the right heel. We have been using a wound VAC over this chronically. She is still on trimethoprim  as directed by infectious disease 9/4; patient is seen in conjunction with HBO. Right dorsal foot wound substantially anteriorly is better however she continues to have a deep wound in the distal part of this that is not responding. We have been using silver collagen under border foam Area on the right plantar medial heel seems better. We have been using Hydrofera Blue 12/12/18 on evaluation today patient appears to be doing about the same with regard to her wound based  on prior measurements. She does have some necrotic tissue noted on the lateral aspect of the wound that is going require a little bit of sharp debridement today. This includes what appears to be potentially either severely necrotic bone or tendon. Nonetheless other than that she does not appear to have any severe infection which is good news 9/18; it is been 2 weeks since I saw this wound. She is tolerating HBO well. Continued dramatic improvement in the area on the right dorsal foot. She still has a small wound on the heel that we have been using Hydrofera Blue. She continues with a wound VAC 9/24; patient has to be seen emergently today with a swelling on her right lateral lower leg. She says that she told Dr. Evette Doffing about this and also myself on a couple of occasions but I really have no recollection of this. She is not systemically unwell and her wound really looked good the last time I saw this. She showed this to providers at dialysis and she was able to verify that she was started on cephalexin today for 5 doses at dialysis. She dialyzes on Tuesday Thursday and Saturday. 10/2; patient is seen in conjunction with HBO. The area that is draining on the right anterior medial tibia is more extensive. Copious amounts of serosanguineous drainage with some purulence. We are still using the wound VAC on the original wound then it is stable. Culture I did of the original IandD showed MRSA I contacted dialysis she is now on vancomycin  with dialysis treatments. I asked them to run a month 10/9; patient seen in conjunction with HBO. She had a new spontaneous open area just above the wound on the right medial tibia ankle. More swelling on the right medial tibia. Her wound on the foot looks about the same perhaps slightly better. There is no warmth spreading up her leg but no obvious erythema. her MRI of the foot and ankle and distal tib-fib is not booked for next Friday I discussed this with her in great detail over multiple days. it is likely she has spreading infection upper leg at least involving the distal 25% above the ankle. She knows that if I refer her to orthopedics for infectious disease they are going to recommend amputation and indeed I am not against this myself. We had a good trial at trying to heal the foot which is what she wanted along with antibiotics debridement and HBO however she clearly has spreading infection [probably staph aureus/MRSA]. Nevertheless she once again tells me she wants to wait the left of the MRI. She still makes comments about having her amputation done in [place]. 10/19; arrives today with significant swelling on the lateral right leg. Last culture I did showed Klebsiella. Multidrug- resistant. Cipro was intermediate sensitivity and that is what I have her on pending her MRI which apparently is going to be done on Thursday this week although this seems to be moving back and forth. She is not systemically unwell. We are using silver alginate on her major wound area on the right medial foot and the draining areas on the right lateral lower leg 10/26; MRI showed extensive abscess in the anterior compartment of the right leg also widespread osteomyelitis involving osseous structures of the midfoot and portions of the hindfoot. Also suspicion for osteomyelitis anterior aspect of the distal medial malleolus. Culture I did of the purulence once again showed a multidrug-resistant Klebsiella. I  have been in contact with nephrology late last week and  she has been started on cefepime at dialysis to replace the vancomycin We sent a copy of her MRI report to Dr. Geroge Baseman in [place] who is an orthopedic surgeon. The patient takes great stock in his opinion on this. She says she will go to [place] to have her leg amputated if Dr. Geroge Baseman does not feel there is any salvage options. 11/2; she still is not talk to her orthopedic surgeon in [place]. Apparently he will call her at [phone] this afternoon. The quality of this is she has not allowed me to refer her anywhere. She has been told over and over that she needs this amputated but has not agreed to be referred. She tells me her blood sugar was 600 last night but she has not been febrile. 11/9; she never did got a call from the orthopedic surgeon in [place] therefore that is off the radar. We have arranged to get her see orthopedic surgery at [facility]. She still has a lot of draining purulence coming out of the new abscess in her right leg although that probably came from the osteomyelitis in her right foot and heel. Meanwhile the original wound on the right foot looks very healthy. Continued improvement. The issue is that the last MRI showed osteomyelitis in her right foot extensively she now has an abscess in the right anterior lower leg. There is nobody in [place] who will offer this woman anything but an amputation and to be honest that is probably what she needs. I think she still wants to talk about limb salvage although at this point I just do not see that. She has completed her vancomycin at dialysis which was for the original staph aureus she is still on cefepime for the more recent Klebsiella. She has had a long course of both of these antibiotics which should have benefited the osteomyelitis on the right foot as well as the abscess. 11/16; apparently [place] elective surgery is shut down because of COVID-19 pandemic.  I have reached out to some contacts at [facility] to see if we can get her an orthopedic appointment there. I am concerned about continually leaving this but for the moment everything is static. In fact her original large wound on this foot is closing down. It is the abscess on the right anterior leg that continues to drain purulent serosanguineous material. She is not currently on any antibiotics however she had a prolonged course of vancomycin [1 month] as well as cefepime for a month 02/24/2019 on evaluation today patient appears to be doing better than the last time I saw her. This is not a patient that I typically see. With that being said I am covering for Dr. Dellia Nims this week and again compared to when I last saw her overall the wounds in particular seem to be doing significantly better which is good news. With that being said the patient tells me several disconcerting things. She has not been able to get in to see anyone for potential debridement in regard to her leg wounds although she tells me that she does not think it is necessary any longer because she is taking care of that herself. She noticed a string coming out of the lower wound on her leg over the last week. The patient states that she subsequently decided that we must of pack something in there and started pulling the string out and as it kept coming and coming she realized this was likely her tendon. With that being said she continued to remove  as much of this as she could. She then I subsequently proceeded to using tubes of antibiotic ointment which she will stick down into the wound and then scored as much as she can until she sees it coming out of the other wound opening. She states that in doing this she is actually made things better and there is less redness and irritation. With regard to her foot wound she does have some necrotic tendon and tissue noted in one small corner but again the actual wound itself seems to be doing  better with good granulation in general compared to my last evaluation. 12/7; continued improvement in the wound on the substantial part of the right medial foot. Still a necrotic area inferiorly that required debridement but the rest of this looks very healthy and is contracting. She has 2 wounds on the right lateral leg which were her original drainage sites from her abscess but all of this looks a lot better as well. She has been using silver alginate after putting antibiotic biotic ointment in one wound and watching it come out the other. I have talked to her in some detail today. I had given her names of orthopedic surgeons at [facility] for second opinion on what to do about the right leg. I do not think the patient never called them. She has not been able to get a hold of the orthopedic surgeon in [place] that she had put a lot of faith in as being somebody would give her an opinion that she would trust. I talked to her today and said even if I could get her in to another orthopedic surgeon about the leg which she accept an amputation and she said she would not therefore I am not going to press this issue for the moment 12/14; continued improvement in his substantial wound on the right medial foot. There is still a necrotic area inferiorly with tightly adherent necrotic debris which I have been working on debriding each time she is here. She does not have an orthopedic appointment. Since last time she was here I looked over her cultures which were essentially MRSA on the foot wound and gram- negative rods in the abscess on the anterior leg. 12/21; continued improvement in the area on the right medial foot. She is not up on this much and that is probably a good thing since I do not know it could support continuous ambulation. She has a small area on the right lateral leg which were remanence of the IandD's I did because of the abscess. I think she should probably have  prophylactic antibiotics I am going to have to look this over to see if we can make an intelligent decision here. In the meantime her major wound is come down nicely. Necrotic area inferiorly is still there but looks a lot better 04/06/2019; she has had some improvement in the overall surface area on the right medial foot somewhat narrowedr both but somewhat longer. The areas on the right lateral leg which were initial IandD sites are superficial. Nothing is present on the right heel. We are using silver alginate to the wound areas 1/18; right medial foot somewhat smaller. Still a deep probing area in the most distal recess of the wound. She has nothing open on the right leg. She has a new wound on the plantar aspect of her left fourth toe which may have come from just pulling skin. The patient using Medihoney on the wound on her foot under silver alginate.  I cannot discourage her from this 2/1; 2-week follow-up using silver alginate on the right foot and her left fourth toe. The area on the right dorsal foot is contracted although there is still the deep area in the most distal part of the wound but still has some probing depth. No overt infection 2/15; 2-week follow-up. She continues to have improvement in the surface area on the dorsal right foot. Even the tunneling area from last time is almost closed. The area that was on the plantar part of her left fourth toe over the PIP is indeed closed 3/1; 2-week follow-up. Continued improvement in surface area. The original divot that we have been debriding inferiorly I think has full epithelialization although the epithelialization is gone down into the wound with probably 4 mm of depth. Even under intense illumination I am unable to see anything open here. The remanence of the wound in this area actually look quite healthy. We have been using silver alginate Electronic Signature(s) Signed: 06/01/2019 5:45:55 PM By: Linton Ham MD Entered By:  Linton Ham on 06/01/2019 14:18:46 -------------------------------------------------------------------------------- Physical Exam Details Patient Name: Date of Service: [name] 06/01/2019 1:00 PM Medical Record Number:[id number] Patient Account Number: [id number] Date of Birth/Sex: Treating RN: 1971-03-06 (49 y.o. Nancy Fetter Primary Care Provider: [name] Other Clinician: Referring Provider: Treating Provider/Extender:Taim Wurm, [name], [name] in Treatment: 12 Constitutional Sitting or standing Blood Pressure is within target range for patient.. Pulse regular and within target range for patient.Marland Kitchen Respirations regular, non-labored and within target range.Marland Kitchen Appears in no distress. Respiratory work of breathing is normal. Cardiovascular Her Salas pedis pulses are palpable bilaterally. Psychiatric appears at normal baseline. Notes Wound exam; continued improvement in the surface of the wound on the dorsal right foot. No debridement is required the divot inferiorly is fully epithelialized although the epithelialization is gone down into the wound with some depth. I cannot identify there is anything open here Electronic Signature(s) Signed: 06/01/2019 5:45:55 PM By: Linton Ham MD Entered By: Linton Ham on 06/01/2019 14:19:39 -------------------------------------------------------------------------------- Physician Orders Details Patient Name: Date of Service: [name] 06/01/2019 1:00 PM Medical Record Number:[id number] Patient Account Number: [id number] Date of Birth/Sex: Treating RN: 05-29-1970 (49 y.o. Nancy Fetter Primary Care Provider: [name] Other Clinician: Referring Provider: Treating Provider/Extender:Biagio Snelson, [name], [name] in Treatment: 53 Verbal / Phone Orders: No Diagnosis Coding ICD-10 Coding Code Description M86.671 Other chronic osteomyelitis, right ankle and foot L97.514  Non-pressure chronic ulcer of other part of right foot with necrosis of bone E10.621 Type 1 diabetes mellitus with foot ulcer L97.521 Non-pressure chronic ulcer of other part of left foot limited to breakdown of skin Follow-up Appointments Return Appointment in 2 weeks. Dressing Change Frequency Wound #43 Right,Medial Foot Change dressing three times week. Wound Cleansing Clean wound with Wound Cleanser - Anasept to all wounds Primary Wound Dressing Wound #43 Right,Medial Foot Calcium Alginate with Silver Other: - may use thin layer of medihoney under alginate Secondary Dressing Wound #43 Right,Medial Foot Kerlix/Rolled [name] Dry [name] Edema Control Elevate legs to the level of the heart or above for 30 minutes daily and/or when sitting, a frequency of: - throughout the day [facility] skilled nursing for wound care. - Interim Electronic Signature(s) Signed: 06/01/2019 5:45:55 PM By: Linton Ham MD Signed: 06/01/2019 5:57:14 PM By: [name] RN, BSN Entered By: [name] on 06/01/2019 14:06:05 -------------------------------------------------------------------------------- Problem List Details Patient Name: Date of Service: [name] 06/01/2019 1:00 PM Medical Record  Number:[id number] Patient Account Number: [id number] Date of Birth/Sex: Treating RN: 1970-08-04 (49 y.o. Nancy Fetter Primary Care Provider: [name] Other Clinician: Referring Provider: Treating Provider/Extender:Telly Jawad, [name], [name] in Treatment: 26 Active Problems ICD-10 Evaluated Encounter Code Description Active Date Today Diagnosis M86.671 Other chronic osteomyelitis, right ankle and foot 09/03/2018 No Yes L97.514 Non-pressure chronic ulcer of other part of right foot 09/03/2018 No Yes with necrosis of bone E10.621 Type 1 diabetes mellitus with foot ulcer 09/24/2018 No Yes L97.521 Non-pressure chronic ulcer of other part of left foot  04/20/2019 No Yes limited to breakdown of skin Inactive Problems ICD-10 Code Description Active Date Inactive Date L97.812 Non-pressure chronic ulcer of other part of right lower leg with 02/24/2019 02/24/2019 fat layer exposed Resolved Problems ICD-10 Code Description Active Date Resolved Date L02.415 Cutaneous abscess of right lower limb 12/25/2018 12/25/2018 Electronic Signature(s) Signed: 06/01/2019 5:45:55 PM By: Linton Ham MD Entered By: Linton Ham on 06/01/2019 14:17:27 -------------------------------------------------------------------------------- Progress Note Details Patient Name: Date of Service: [name] 06/01/2019 1:00 PM Medical Record Number:[id number] Patient Account Number: [id number] Date of Birth/Sex: Treating RN: 11-Jan-1971 (49 y.o. Nancy Fetter Primary Care Provider: [name] Other Clinician: Referring Provider: Treating Provider/Extender:Carnella Fryman, [name], [name] in Treatment: 67 Subjective History of Present Illness (HPI) 49 year old diabetic who is known to have type 1 diabetes which is poorly controlled last hemoglobin A1c was 11%. She comes in with a ulcerated area on the left lateral foot which has been there for over 6 months. Was recently she has been treated by [name] of podiatry who saw her last on 05/28/2016. Review of his notes revealed that the patient had incision and drainage with placement of antibiotic beads to the left foot on 04/11/2016 for possible osteomyelitis of the cuboid bone. Over the last year she's had a history of amputation of the left fifth toe and a femoropopliteal popliteal bypass graft somewhere in April 2017. 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 His note [name] mentions that the patient has been referred to me for further wound care and possibly great candidate for hyperbaric oxygen therapy due to recurrent osteomyelitis. However we do not have any x-rays of  biopsy reports confirming this. He has been on several antibiotics including Bactrim and most recently is on doxycycline for an MRSA. I understand, the patient was not a candidate for IV antibiotics as she has had previous PICC lines which resulted in blood clots in both arms. There was a x-ray report dated 04/04/2016 on [name] notes which showed evidence of fifth ray resection left foot with osteolytic changes noted to the fourth metatarsal and cuboid bone on the left. 06/13/2016 -- had a left foot x-ray which showed no acute fracture or dislocation and no definite radiographic evidence of osteomyelitis. Advanced osteopenia was seen. 06/20/2016 -- she has noticed a new wound on the right plantar foot in the region where she had a callus before. 06/27/16- the patient did have her x-ray of the right foot which showed no findings to suggest osteomyelitis. She saw her endocrinologist, [name], yesterday. Her A1c in January was 11. He also indicates mismanagement and noncompliance regarding her diabetes. She is currently on Bactrim for a lip infection. She is complaining of nausea, vomiting and diarrhea. She is unable to articulate the exact orders or dosing of the Bactrim; it is unclear when she will complete this. 07/04/2016 -- results from [facility] of ABIs with ankle waveforms were noted from 02/14/2016. The  examination done on 06/27/2015 showed noncompressible ABIs with the right being 1.45 and the left being 1.33. The present examination showed a right ABI of 1.19 on the left of 1.33. The conclusion was that right normal ABI in the lower extremity at rest however compared to previous study which was noncompressible ABI may be falsely elevated side suggesting medial calcification. The left ABI suggested medial calcification. 08/01/2016 -- the patient had more redness and pain on her right foot and did not get to come to see as noted she see her PCP or go to the ER and decided to take some  leftover metronidazole which she had at home. As usual, the patient does report she feels and is rather noncompliant. 08/08/2016 -- -- x-ray of the right foot -- FINDINGS:Transmetatarsal amputation is noted. No bony destruction is noted to suggest osteomyelitis. IMPRESSION: No evidence of osteomyelitis. Postsurgical changes are seen. MRI would be more sensitive for possible bony changes. Culture has grown Serratia Marcescens -- sensitive to Bactrim, ciprofloxacin, ceftazidime she was seen by [name] on 08/06/2016. He did not find any exposed bone, muscle, tendon, ligament or joint. There was no malodor and he did a excisional debridement in the office. ============ Old notes: 49 year old patient who is known to the wound clinic for a while had been away from the wound clinic since 09/01/2014. Over the last several months she has been admitted to various hospitals including [place] at [facility]. She was treated for a right metatarsal osteomyelitis with a transmetatarsal amputation and this was done about 2 months ago. He has a small ulcerated area on the right heel and she continues to have an ulcerated area on the left plantar aspect of the foot. The patient was recently admitted to the [facility] hospital group between 7/12 and 10/18/2014. she was given 3 weeks of IV vancomycin and was to follow-up with her surgeons at [facility] and also took oral vancomycin for C. difficile colitis. 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 MRI of the left foot done on 10/14/2014 showed no abscess or osteomyelitis. 04/27/15; this is a patient we know from previous stays in the wound care center. She is a type I  diabetic I am not sure of her control currently. Since the last time I saw her she is had a right transmetatarsal amputation and has no wounds on her right foot and has no open wounds. She is been followed at the wound care center at [facility] in [place]. She comes today with the desire to undergo hyperbaric treatment locally. Apparently one of her wound care providers in [place] has suggested hyperbarics. This is in response to an MRI from 04/18/15 that showed increased marrow signal and loss of the proximal fifth metatarsal cortex evidence of osteomyelitis with likely early osteomyelitis in the cuboid bone as well. She has a large wound over the base of the fifth metatarsal. She also has a eschar over her the tips of her toes on 1,3 and 5. She does not have peripheral pulses and apparently is going for an angiogram tomorrow which seems reasonable. After this she is going to infectious disease at [facility]. They have been using Medihoney to the large wound on the lateral aspect of the left foot to. The patient has known  Charcot deformity from diabetic neuropathy. She also has known diabetic PAD. Surprisingly I can't see that she has had any recent antibiotics, the patient states the last antibiotic she had was at the end of November for 10 days. I think this was in response to culture that showed group G strep although I'm not exactly sure where the culture was from. She is also had arterial studies on 03/29/15. This showed a right ABI of 1.4 that was noncompressible. Her left ABI was 0.73. 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 It was not felt that arterial inflow was adequate for healing of a foot ulcer. Her Doppler waveforms looked monophasic ===== READMISSION 02/28/17; this is in an now 49 year old woman we've had at several different occasions in this clinic. 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 She has a  remote ex-smoker. She was last seen in this clinic by Dr. Con Memos I think in May. More recently she is been followed by her podiatrist [name] an infectious disease Dr. Megan Salon. She has 2 open wounds the major one is over the right first metatarsal head she also has a wound on the left plantar foot. an MRI of the right foot on 01/01/17 showed a soft tissue ulcer along the plantar aspect of the first metatarsal base consistent with osteomyelitis of the first metatarsal stump. Dr. 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 According to the patient this is been open for slightly over a month. She has been on a combination of Cipro 500 twice a day, Zyvox 600 twice a day and Flagyl 500 3 times a day for over a month now as directed by Dr. Megan Salon. cultures of the right foot earlier this year showed MRSA in January and Serratia in May. January also had a few viridans strep. Recent x-rays of both feet were done and [name] office and I don't have these reports. The patient has known PAD and has a history of aleft femoropopliteal bypass in April 2017. 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 Dwyane Dee. In spite of this the last hemoglobin A1c I can see is 10.1 on 01/01/2017. She is being referred by 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 She is been using Medihoney to this area and also an area on the left midfoot. She is using healing sandals bilaterally. ABIs in this clinic at the left posterior tibial was 1.1 noncompressible on the right READMISSION Non invasive vascular NOVANT 5/18 Aftercare following surgery of the circulatory system Procedure Note - Interface, External Ris In -  08/13/2016 11:05 AM EDT Procedure: Examination consists of physiologic resting arterial pressures of the brachial and ankle arteries bilaterally with continuous wave Doppler waveform analysis. Previous: Previous exam performed on 02/14/16 demonstrated ABIs of Rt = 1.19 and Lt = 1.33. Right: ABI = non-compressible PT, 1.47 DP. S/P transmet amputation. Left: ABI = 1.52, 2nd digit pressure = 87 mmHg Conclusions: Right: ABI (>1.3) may be falsely elevated, suggesting medial calcification. Left: ABI (>1.3) may be falsely elevated, suggesting medial calcification The patient is a now 49 year old type I diabetic is had multiple issues her graded to chronic diabetic foot ulcers. She has had a  previous right transmetatarsal amputation fifth ray amputation. She had Charcot feet diabetic polyneuropathy. We had her in the clinic lastin November. At that point she had wounds on her bilateral feet.she had wanted to try hyperbarics however the healogics review process denied her because she hadn't followed up with her vascular surgeon for her left femoropopliteal bypass. The bypass was done by Dr. Raul Del at [facility]. We made her a follow-up with Dr. Raul Del however she did not keep the appointment and therefore she was not approved The patient shows me a small wound on her left fourth metatarsal head on her phone. She developed rapid discoloration in the plantar aspect of the left foot and she was admitted to hospital from 2/2 through 05/10/17 with wet gangrene of the left foot osteomyelitis of the fourth metatarsal heads. She was admitted acutely ill with a temperature of 103. She was started on broad-spectrum vancomycin and cefepime. On 05/06/17 she was taken to the OR by [name] her podiatric surgeon for an incision and drainage irrigation of the left foot wound. Cultures from this surgery revealed group be strep and anaerobes. she was seen by Dr.Xu of orthopedic surgery and scheduled for a below-knee  amputation which she u refused. Ultimately she was discharged on Levaquin and Flagyl for one month. MRI 05/05/17 done while she was in the hospital showed abscess adjacent to the fourth metatarsal head and neck small abscess around the fourth flexor tendon. Inflammatory phlegmon and gas in the soft tissues along the lateral aspect of the fourth phalanx. Findings worrisome for osteomyelitis involving the fourth proximal and middle phalanx and also the third and fourth metatarsals. Finally the patient had actually shortly before this followed up with Dr. Raul Del at no time on 04/29/17. He felt that her left femoropopliteal bypass was patent he felt that her left-sided toe pressures more than adequate for healing a wound on the left foot. This was before her acute presentation. 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 05/28/17; she is started hyperbarics. The patient tells me that for some reason she was not actually on Levaquin but I think on ciprofloxacin. She was on Flagyl. She only started her Levaquin yesterday due to some difficulty with the pharmacy and perhaps her sister picking it up. She has an appointment with [name] tomorrow and with infectious disease early next week. She has no new complaints 06/06/17; the patient continues in hyperbarics. She saw [name] on 05/29/17 who is her podiatric surgeon. He is elected for a transmetatarsal amputation on 06/27/17. I'm not sure at what level he plans to do this amputation. The patient is unaware ooShe also saw Dr. Megan Salon of infectious disease who elected to continue her on current antibiotics I think this is ciprofloxacin and Flagyl. I'll need to clarify with her tomorrow if she actually has this. We're using silver alginate to the actual wound. Necrotic surface today with material under the flap of her foot. ooOriginal MRI showed abscesses as well as osteomyelitis of the proximal and middle fourth phalanx and the third and fourth metatarsal  heads 06/11/17; patient continues in hyperbarics and continues on oral antibiotics. She is doing well. The wound looks better. The necrotic part of this under the flap in her superior foot also looks better. she is been to see [name]. I haven't had a chance to look at his note. Apparently he has put the transmetatarsal amputation on hold her request it is still planning to take her to the OR for debridement and product  application ACEL. I'll see if I can find his note. I'll therefore leave product ordering/requests to [name] for now. I was going to look at Dermagraft 06/18/17-she is here in follow-up evaluation for bilateral foot wounds. She continues with hyperbaric therapy. She states she has been applying manuka honey to the right plantar foot and alternate manuka honey and silver alginate to the left foot, despite our orders. We will continue with same treatment plan and she will follo up next week. 06/25/17; I have reviewed [name] last note from 3/11. She has operative debridement in 2 days' time. By review his note apparently they're going to place there is skin over the majority of this wound which is a good choice. She has a small satellite area at the most proximal part of this wound on the left plantar foot. The area on the right plantar foot we've been using silver alginate and it is close to healing. 07/02/17; unfortunately the patient was not easily approved for [name] proposed surgery. I'm not completely certain what the issue is. She has been using silver alginate to the wound she has completed a first course of hyperbarics. She is still on Levaquin and Flagyl. I have really lost track of the time course here.I suspect she should have another week to 2 of antibiotics. I'll need to see if she is followed up with infectious disease Dr. Megan Salon 07/09/17; the patient is followed up with Dr. Megan Salon. 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 She continues on Levaquin and metronidazole continuing both of these for now I think she is been on fr about 6 weeks. She still has some drainage but no pain. No fever. Her had been plans for her to go to the OR for operative debridement with her podiatrist [name], I am not exactly sure where that is. I'll probably slip a note to [name] today. I note that she follows with Dr. Dwyane Dee of endocrinology. We have her recertified for hyperbaric oxygen. I have not heard about Dermagraft however I'll see if [name] is planning a skin substitute as well 07/16/17; the patient tells me she is just about out of [place]. I'll need to check Dr. Hale Bogus last notes on this. She states she has plenty of Flagyl however. She comes in today complaining of pain in the right lateral foot which she said lasted for about a day. The wound on the right foot is actually much more medially. She also tells me that the [facility] cost a lot of pain in the left foot wound and she turned back to silver alginate. Finally Dermagraft has a $165 per application co-pay. She cannot afford this 07/23/17; patient arrives today with the wound not much smaller. There is not much new to add. She has not heard from [name] all try to put in a call to them today. She was asking about Dermagraft again and she has an over $537 per application co-pay she states that she would be willing to try to do a payment plan. I been tried to avoid this. We've been using silver alginate, I'll change to [facility] 07/30/17-She is here in follow-up evaluation for left foot ulcer. She continues hyperbaric medicine. The left foot ulcer is stable we will continue with same treatment plan 08/06/17; she is here for evaluation of her left foot ulcer. Currently being treated for hyperbarics or underlying osteomyelitis. She is completed antibiotics. The left foot  ulcer is better smaller with healthier looking granulation. For various reasons I  am not really clear on we never got her back to the OR with [name]. He did not respond to my secure text message. Nevertheless I think that surgery on this point is not necessary nor am I completely clear that a skin substitute is necessary The patient is complaining about pain on the outside of her right foot. 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 There is no erythema. She also states the foot is warm versus her other part of her upper leg and this is largely true. It is not totally clear to me what's causing this. She thinks it's different from her usual neuropathy pain 08/13/17; she arrives in clinic today with a small wound which is superficial on her right first metatarsal head. 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 She tells [place] she was up on her feet over the Mother's Day celebration. ooThe large wound is on the left foot. Continues with hyperbarics for underlying osteomyelitis. We're using Hydrofera Blue. She asked me today about where we were with Dermagraft. I had actually excluded this because of the co- pay however she wants to assume this therefore I'll recheck the co-pay an order for next week. 08/20/17; the patient agreed to accept the co-pay of the first Apligraf which we applied today. She is disappointed she is finishing hyperbarics will run this through the insurance on the extent of the foot infection and the extent of the wound that she had however she is already had 60 dive's. Dermagraft No. 1 08/27/17; Dermagraft No. 2. She is not eligible for any more hyperbaric treatments this month. She reports a fair amount of drainage and she actually changed to the external dressings without disturbing the direct contact layer 09/03/17; the patient arrived in clinic today with the wound superficially looking quite healthy. Nice vibrant red tissue with some advancing epithelialization although not as much adherence of the flap as I might like.  However she noted on her own fourth toe some bogginess and she brought that to our attention. Indeed this was boggy feeling like a possibility of subcutaneous fluid. 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 She is not been unwell. We've been using Dermagraft 09/10/17; the culture that I did not last week was MRSA. She saw Dr. Megan Salon this morning who is going to start her on vancomycin. I had sent him a secure a text message yesterday. I also spoke with her podiatric surgeon [name] about surgery on this foot the options for conserving a functional foot etc. Promised me he would see her and will make back consultation today. Paradoxically her actual wound on the plantar aspect of her left foot looks really quite good. I had given her 5 days worth of Baxdella to cover her for MRSA. Her MRI came back showing osteomyelitis within the third metatarsal shaft and head and base of the third and fourth proximal phalanx. She had extensive inflammatory changes throughout the soft tissue of the lateral forefoot. With an ill-defined fluid around the fourth metatarsal extending into the plantar and dorsal soft tissues 09/19/17; the patient is actually on oral Septra and Flagyl. She apparently refused IV vancomycin. She also saw [name] at my request who is planning her for a left BKA sometime in mid July. MRI showed osteomyelitis within the third metatarsal shaft and head  and the basis of the third and fourth proximal phalanx. I believe there was felt to be possible septic arthritis involving the third MTP. 09/26/17; the patient went back to Dr. Megan Salon at my suggestion and is now receiving IV daptomycin. Her wound continues to look quite good making the decision to proceed with a transmetatarsal amputation although more difficult for the patient. I believe in my extensive discussions with her she has a good sense of the pros and cons  of this. I don't NV the tuft decision she has to make. She has an appointment with [name] I believe in mid July and I previously spoken to him about this issue Has we had used 3 previous Dermagraft. Given the condition of the wound surface I went ahead and added the fourth one today area and I did this not fully realizing that she'll be traveling to [place] next week. I'm hopeful she can come back in 2 weeks 10/21/17; Her same Dermagraft on for about 3-1/2 weeks. In spite of this the wound arrives looking quite healthy. There is been a lot of healing dimensions are smaller. Looking at the square shaped wound she has now there is some undermining and some depth medially under the undermining although I cannot palpate any bone. No surrounding infection is obvious. She has difficult questions about how to look at this going forward vis--vis amputations versus continued medical therapy. To be truthful the wound is looks so healthy and it is continued to contract. Hard to justify foot surgery at this point although I still told her that I think it might come to that if we are not able to eradicate the underlying MRSA. She is still highly at risk and she understands this 11/06/17 on evaluation today patient appears to be doing better in regard to her foot ulcer. She's been tolerating the dressing changes without complication. Currently she is here for her Dermagraft #6. Her wound continues to make excellent progress at this point. She does not appear to have any evidence of infection which is good news. 11/13/17 on evaluation today patient appears to be doing excellent at this time. She is here for repeat Dermagraft application. This is #7. Overall her wound seems to be making great progress. 12/05/17; the patient arrives with the wound in much better condition than when I last saw this almost 6 weeks ago. She still has a small probing area in the left metatarsal head region on the lateral aspect of her  foot. We applied her last Dermagraft today. ooSince the last time she is here she has what appears to a been a blood blister on the plantar aspect of left foot although I don't see this is threatening. There is also a thick raised tissue on the right mid metatarsal head region. This was not there I don't think the last time she was here 3 weeks ago. 12/12/17; the patient continues to have a small programming area in the left metatarsal head region on the lateral aspect of her foot which was the initial large surgical wound. I applied her last Apligraf last week. I'm going to use Endoform starting today ooUnfortunately she has an excoriated area in the left mid foot and the right mid foot. The left midfoot looks like a blistered area this was not opened last week it certainly is open today. Using silver alginate on these areas. She promises me she is offloading this. 12/19/17; the small probing area in the left metatarsal head eyes think is shallower.  In general her original wound looks better. We've been using Endoform. The area inferiorly that I think was trauma last week still requires debridement a lot of nonviable surface which I removed. She still has an open open area distally in her foot ooSimilarly on the right foot there is tightly adherent surface debris which I removed. Still areas that don't look completely epithelialized. This is a small open area. We used silver alginate on these areas 12/26/2017; the patient did not have the supplies we ordered from last week including the Endoform. The original large wound on the left lateral foot looks healthy. She still has the undermining area that is largely unchanged from last week. She has the same heavily callused raised edged wounds on the right mid and left midfoot. Both of these requiring debridement. We have been using silver alginate on these areas 01/02/2018; there is still supply issues. We are going to try to use Prisma but I am not  sure she actually got it from what she is saying. She has a new open area on the lateral aspect of the left fourth toe [previous fifth ray amputation]. Still the one tunneling area over the fourth metatarsal head. The area is in the midfoot bilaterally still have thick callus around them. She is concerned about a raised swelling on the lateral aspect of the foot. However she is completely insensate 01/10/2018; we are using Prisma to the wounds on her bilateral feet. Surprisingly the tunneling area over the left fourth metatarsal head that was part of her original surgery has closed down. She has a small open area remaining on the incision line. 2 open areas in the midfoot. 02/10/2018; the patient arrives back in clinic after a month hiatus. She was traveling to visit family in [place]. Is fairly clear she was not offloading the areas on her feet. The original wound over the left lateral foot at the level of metatarsal heads is reopened and probes medially by about a centimeter or 2. She notes that a week ago she had purulent drainage come out of an area on the left midfoot. Paradoxically the worst area is actually on the right foot is extensive with purulent drainage. We will use silver alginate today 02/17/2018; the patient has 3 wounds one over the left lateral foot. She still has a small area over the metatarsal heads which is the remnant of her original surgical wound. This has medial probing depth of roughly 1.4 cm somewhat better than last week. The area on the right foot is larger. We have been using silver alginate to all areas. The area on the right foot and left foot that we cultured last week showed both Klebsiella and Proteus. Both of these are quinolone sensitive. The patient put her's self on Bactrim and Flagyl that she had left hanging around from prior antibiotic usages. She was apparently on this last week when she arrived. I did not realize this. Unfortunately the Bactrim will  not cover either 1 of these organisms. We will send in Cipro 500 twice daily for a week 03/04/2018; the patient has 2 wounds on the left foot one is the original wound which was a surgical wound for a deep DFU. At one point this had exposed bone. She still has an area over the fourth metatarsal head that probes about 1.4 cm although I think this is better than last week. I been using silver nitrate to try and promote tissue adherence and been using silver alginate here. ooShe also  has an area in the left midfoot. This has some depth but a small linear wound. Still requiring debridement. ooOn the right midfoot is a circular wound. A lot of thick callus around this area. ooWe have been using silver alginate to all wound areas ooShe is completed the ciprofloxacin I gave her 2 weeks ago. 03/11/2018; the patient continues to have 2 open areas on the left foot 1 of which was the original surgical wound for a deep DFU. Only a small probing area remains although this is not much different from last week we have been using silver alginate. The other area is on the midfoot this is smaller linear but still with some depth. We have been using silver alginate here as well ooOn the right foot she has a small circular wound in the mid aspect. This is not much smaller than last time. We have been using silver alginate here as well 03/18/2018; she has 3 wounds on the left foot the original surgical wound, a very superficial wound in the mid aspect and then finally the area in the mid plantar foot. She arrives in today with a very concerning area in the wound in the mid plantar foot which is her most proximal wound. There is undermining here of roughly 1-1/2 cm superiorly. Serosanguineous drainage. She tells me she had some pain on for over the weekend that shot up her foot into her thigh and she tells me that she had a nodule in the groin area. ooShe has the single wound in the right foot. ooWe are using  endoform to both wound areas 03/24/2018; the patient arrives with the original surgical wound in the area on the left midfoot about the same as last week. There is a collection of fluid under the surface of the skin extending from the surgical wound towards the midfoot although it does not reach the midfoot wound. The area on the right foot is about the same. Cultures from last week of the left midfoot wound showed abundant Klebsiella abundant Enterococcus faecalis and moderate methicillin resistant staph I gave her Levaquin but this would have only covered the Klebsiella. She will need linezolid 04/01/2018; she is taking linezolid but for the first few days only took 1 a day. I have advised her to finish this at twice daily dosing. In any case all of her wounds are a lot better especially on the left foot. The original surgical wound is closed. The area on the left midfoot considerably smaller. The area on the right foot also smaller. 04/08/2018; her original surgical wound/osteomyelitis on the left foot remains closed. She has area on the left foot that is in the midfoot area but she had some streaking towards this. This is not connected with her original wound at least not visually. ooSmall wound on the right midfoot appears somewhat smaller. 04/15/18; both wounds looks better. Original wound is better left midfoot. Using silver alginate 1/21; patient states she uses saltwater soak in, stones or remove callus from around her wounds. She is also concerned about a blood blister she had on the left foot but it simply resolved on its own. We've been using silver alginate 1/28; the patient arrives today with the same streaking area from her metatarsals laterally [the site of her original surgical wound] down to the middle of her foot. There is some drainage in the subcutaneous area here. This concerns me that there is actually continued ongoing infection in the metatarsals probably the fourth and  third. This  fixates an MRI of the foot without contrast [chronic renal failure] ooThe wound in the mid part of the foot is small but I wonder whether this area actually connects with the more distal foot. ooThe area on the right midfoot is probably about the same. Callus thick skin around the small wound which I removed with a curette we have been using silver alginate on both wound areas 2/4; culture I did of the draining site on the left foot last time grew methicillin sensitive staph aureus. MRI of the left foot showed interval resolution of the findings surrounding the third metatarsal joint on the prior study consistent with treated osteomyelitis. Chronic soft tissue ulceration in the plantar and lateral aspect of the forefoot without residual focal fluid collection. No evidence of recurrent osteomyelitis. 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 She tells me she had nausea and vomiting over the weekend and missed 2 days. In general her foot looks much the same. She has a small open area just below the left fourth metatarsal head. A linear area in the left midfoot. Some discoloration extending from the inferior part of this into the left lateral foot although this appears to be superficial. She has a small area on the right midfoot which generally looks smaller after debridement 2/18; the patient is completing his cephalexin and has another 2 days. She continues to have open areas on the left and right foot. 2/25; she is now off antibiotics. The area on the left foot at the site of her original surgical wound has closed yet again. She still has open areas in the mid part of her foot however these appear smaller. The area on the right mid foot  looks about the same. We have been using silver alginate She tells me she had a serious hypoglycemic spell at home. She had to have EMS called and get IV dextrose 3/3; disappointing on the left lateral foot large area of necrotic tissue surrounding the linear area. This appears to track up towards the same original surgical wound. Required extensive debridement. The area on the right plantar foot is not a lot better also using silver 3/12; the culture I did last time showed abundant enterococcus. I have prescribed Augmentin, should cover any unrecognized anaerobes as well. In addition there were a few MRSA and Serratia that would not be well covered although I did not want to give her multiple antibiotics. She comes in today with a new wound in the right midfoot this is not connected with the original wound over her MTP a lot of thick callus tissue around both wounds but once again she said she is not walking on these areas 3/17-Patient comes in for follow-up on the bilateral plantar wounds, the right midfoot and the left plantar wound. Both these are heavily callused surrounding the wounds. We are continuing to use silver alginate, she is compliant with offloading and states she uses a wheelchair fairly often at home 3/24; both wound areas have thick callus. However things actually look quite a bit better here for the majority of her left foot and the right foot. 3/31; patient continues to have thick callused somewhat irritated looking tissue around the wounds which individually are fairly superficial. There is no evidence of surrounding  infection. We have been using silver alginate however I change that to [facility] today 4/17; patient returns to clinic after having a scare with Covid she tested negative in her primary doctor's office. She has been using Hydrofera Blue. She does not have an open area on the right foot. On the left foot she has a small open area with the mid area not  completely viable. She showed me pictures of what looks like a hemorrhagic blister from several days ago but that seems to have healed over this was on the lateral left foot 4/21; patient comes in to clinic with both her wounds on her feet closed. However over the weekend she started having pain in her right foot and leg up into the thigh. She felt as though she was running a low-grade fever but did not take her temperature. She took a doxycycline that she had leftover and yesterday a single Septra and metronidazole. She thinks things feel somewhat better. 4/28; duplex ultrasound I ordered last week was negative for DVT or superficial thrombophlebitis. She is completed the doxycycline I gave her. States she is still having a lot of pain in the right calf and right ankle which is no better than last week. She cannot sleep. She also states she has a temperature of up to 101, coughing and complaining of visual loss in her bilateral eyes. Apparently she was tested for Covid 2 weeks ago at [facility] and that was negative. Readmission: 09/03/18 patient presents back for reevaluation after having been evaluated at the end of April regarding erythema and swelling of her right lower extremity. Subsequently she ended up going to the hospital on 07/29/18 and was admitted not to be discharged until 08/08/18. Unfortunately it was noted during the time that she was in the hospital that she did have methicillin-resistant Staphylococcus aureus as the infection noted at the site. It was also determined that she did have osteomyelitis which appears to be fairly significant. She was treated with vancomycin and in fact is still on IV vancomycin at dialysis currently. This is actually slated to continue until 09/12/18 at least which will be the completion of the six weeks of therapy. Nonetheless based on what I'm seeing at this point I'm not sure she will be anywhere near ready to discontinue antibiotics at that time. Since  she was released from the hospital she was seen by [name] who is her podiatrist on 08/27/18. His note specifically states that he is recommended that the patient needs of one knee amputation on the right as she has a life-threatening situation that can lead quickly to sepsis. The patient advised she would like to try to save her leg to which [name] apparently told her that this was against all medical advice. She also want to discontinue the Wound VAC which had been initiated due to the fact that she wasn't pleased with how the wound was looking and subsequently she wanted to pursue applying Medihoney at that time. He stated that he did not believe that the right lower extremity was salvageable and that the patient understood but would still like to attempt hyperbaric option therapy if it could be of any benefit. She was therefore referred back to [place] for further evaluation. He plans to see her back next week. Upon inspection today patient has a significant amount purulent drainage noted from the wound at this point. The bone in the distal portion of her foot also appears to be extremely necrotic and spongy. When  I push down on the bone it bubbles and seeps purulent drainage from deeper in the end of the foot. I do not think that this is likely going to heal very well at all and less aggressive surgical debridement were undertaken more than what I believe we can likely do here in our office. 09/12/2018; I have not seen this patient since the most recent hospitalization although she was in our clinic last week. I have reviewed some of her records from a complex hospitalization. 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 There is situation was complicated by MRSA bacteremia and acute on chronic renal failure now on dialysis. She is receiving vancomycin at dialysis. We started her on Dakin's wet-to- dry last week she is changing this daily. There is  still purulent drainage coming out of her foot. Although she is apparently "agreeable" to a below-knee amputation which is been suggested by multiple clinicians she wants this to be done in [place]. She apparently has a telehealth visit with that provider sometime in late [place] 6/24. I have told her I think this is probably too long. Nevertheless I could not convince her to allow a local doctor to perform BKA. 09/19/2018; the patient has a large necrotic area on the right anterior foot. She has had previous transmetatarsal amputations. Culture I did last week showed MRSA nothing else she is on vancomycin at dialysis. She has continued leaking purulent drainage out of the distal part of the large circular wound on the right anterior foot. She apparently went to see Dr. 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 Somehow that translated into her being referred to plastic surgery for debridement of the area. I gather she basically refused amputation although I do not have a copy of Dr. Berenice Primas notes. The patient really wants to have a trial of hyperbaric oxygen. I agreed with initial assessment in this clinic that this was probably too far along to benefit however if she is going to have plastic surgery I think she would benefit from ancillary hyperbaric oxygen. The issue here is that the patient has benefited as maximally as any patient I have ever seen from hyperbaric oxygen therapy. Most recently she had exposed bone on the lateral part of her left foot after a surgical procedure and that actually has closed. She has eschared areas in both heels but no open area. She is remained systemically well. I am not optimistic that anything can be done about this but the patient is very clear that she wants an attempt. The attempt would include a wound VAC further debridements and hyperbaric oxygen along with IV antibiotics. 6/26; I put her in for a trial of hyperbaric oxygen  only because of the dramatic response she has had with wounds on her left midfoot earlier this year which was a surgical wound that went straight to her bone over the metatarsal heads and also remotely the left third toe. We will see if we can get this through our review process and insurance. She arrives in clinic with again purulent material pouring out of necrotic bone on the top of the foot distally. There is also some concerning erythema on the front of the leg that we marked. It is bit difficult to tell how tender this is because of neuropathy. I note from infectious disease that she had her vancomycin extended. 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 She had the  wound VAC on for part of the week. The rest of the time she is putting various things on this including Medihoney, "ionized water" silver sorb gel etc. 7/7; follow-up along with HBO. She is still on vancomycin at dialysis. She has a large open area on the dorsal right foot and a small dark eschar area on her heel. There is a lot less erythema in the area and a lot less tenderness. From an infection point of view I think this is better. She still has a lot of necrosis in the remaining right forefoot [previous TMA] we are still using the wound VAC in this area 7/16; follow-up along with HBO. I put her on linezolid after she finished her vancomycin. We started this last Friday I gave her 2 weeks worth. I had the expectation that she would be operatively debrided by Dr. Marla Roe but that still has not happened yet. Patient phoned the office this week. She arrives for review today after HBO. The distal part of this wound is completely necrotic. Nonviable pieces of tendon bone was still purulent drainage. Also concerning that she has black eschar over the heel that is expanding. I think this may be indicative of infection in this area as well. She has less erythema and warmth in the ankle and calf but  still an abnormal exam 7/21 follow-up along with HBO. I will renew her linezolid after checking a CBC with differential monitoring her blood counts especially her platelets. She was supposed to have surgery yesterday but if I am reading things correctly this was canceled after her blood sugar was found to be over 500. I thought Dr. Marla Roe who called me said that they were sending her to the ER but the patient states that was not the case. 7/28. Follow-up along with HBO. She is on linezolid I still do not have any lab work from dialysis even though I called last week. The patient is concerned about an area on her left lateral foot about the level of the base of her fifth metatarsal. I did not really see anything that ominous here however this patient is in [place] ability to point out problems that she is sensing and she has been accurate in the past Finally she received a call from Dr. Marla Roe who is referring her to another orthopedic surgeon stating that she is too booked up to take her to the operating room now. Was still using a wound VAC on the foot 8/3 -Follow-up after HBO, she is got another week of linezolid, she is to call [facility] for an appointment, x-rays of both feet were reviewed, the left foot x-ray with third MTP joint osteo- Right foot x-ray widespread osteo-in the right midfoot Right ankle x-ray does not show any active evidence of infection 8/11-Patient is seen after HBO, the wounds on the right foot appear to be about the same, the heel wound had some necrotic base over tendon that was debrided with a curette 8/21; patient is seen after HBO. The patient's wound on her dorsal foot actually looks reasonably good and there is substantial amount of epithelialization however the open area distally still has a lot of necrotic debris partially bone. I cannot really get a good sense of just how deep this probes under the foot. She has been pressuring me this week to order medical  maggots through a company in [place] for her. The problem I have is there is not a defined wound area here. On the positive side there is  no purulence. She has been to see infectious disease she is still on Septra DS although I have not had a chance to review their notes 8/28; patient is seen in conjunction with HBO. The wounds on her foot continued to improve including the right dorsal foot substantially the, the distal part of this wound and the area on the right heel. We have been using a wound VAC over this chronically. She is still on trimethoprim as directed by infectious disease 9/4; patient is seen in conjunction with HBO. Right dorsal foot wound substantially anteriorly is better however she continues to have a deep wound in the distal part of this that is not responding. We have been using silver collagen under border foam ooArea on the right plantar medial heel seems better. We have been using Hydrofera Blue 12/12/18 on evaluation today patient appears to be doing about the same with regard to her wound based on prior measurements. She does have some necrotic tissue noted on the lateral aspect of the wound that is going require a little bit of sharp debridement today. This includes what appears to be potentially either severely necrotic bone or tendon. Nonetheless other than that she does not appear to have any severe infection which is good news 9/18; it is been 2 weeks since I saw this wound. She is tolerating HBO well. Continued dramatic improvement in the area on the right dorsal foot. She still has a small wound on the heel that we have been using Hydrofera Blue. She continues with a wound VAC 9/24; patient has to be seen emergently today with a swelling on her right lateral lower leg. She says that she told Dr. Evette Doffing about this and also myself on a couple of occasions but I really have no recollection of this. She is not systemically unwell and her wound really looked good  the last time I saw this. She showed this to providers at dialysis and she was able to verify that she was started on cephalexin today for 5 doses at dialysis. She dialyzes on Tuesday Thursday and Saturday. 10/2; patient is seen in conjunction with HBO. The area that is draining on the right anterior medial tibia is more extensive. Copious amounts of serosanguineous drainage with some purulence. We are still using the wound VAC on the original wound then it is stable. Culture I did of the original IandD showed MRSA I contacted dialysis she is now on vancomycin with dialysis treatments. I asked them to run a month 10/9; patient seen in conjunction with HBO. She had a new spontaneous open area just above the wound on the right medial tibia ankle. More swelling on the right medial tibia. Her wound on the foot looks about the same perhaps slightly better. There is no warmth spreading up her leg but no obvious erythema. her MRI of the foot and ankle and distal tib-fib is not booked for next Friday I discussed this with her in great detail over multiple days. it is likely she has spreading infection upper leg at least involving the distal 25% above the ankle. She knows that if I refer her to orthopedics for infectious disease they are going to recommend amputation and indeed I am not against this myself. We had a good trial at trying to heal the foot which is what she wanted along with antibiotics debridement and HBO however she clearly has spreading infection [probably staph aureus/MRSA]. Nevertheless she once again tells me she wants to wait the left of the  MRI. She still makes comments about having her amputation done in [place]. 10/19; arrives today with significant swelling on the lateral right leg. Last culture I did showed Klebsiella. Multidrug- resistant. Cipro was intermediate sensitivity and that is what I have her on pending her MRI which apparently is going to be done on Thursday this  week although this seems to be moving back and forth. She is not systemically unwell. We are using silver alginate on her major wound area on the right medial foot and the draining areas on the right lateral lower leg 10/26; MRI showed extensive abscess in the anterior compartment of the right leg also widespread osteomyelitis involving osseous structures of the midfoot and portions of the hindfoot. Also suspicion for osteomyelitis anterior aspect of the distal medial malleolus. Culture I did of the purulence once again showed a multidrug-resistant Klebsiella. I have been in contact with nephrology late last week and she has been started on cefepime at dialysis to replace the vancomycin We sent a copy of her MRI report to Dr. Geroge Baseman in [place] who is an orthopedic surgeon. The patient takes great stock in his opinion on this. She says she will go to [place] to have her leg amputated if Dr. Geroge Baseman does not feel there is any salvage options. 11/2; she still is not talk to her orthopedic surgeon in [place]. Apparently he will call her at [phone] this afternoon. The quality of this is she has not allowed me to refer her anywhere. She has been told over and over that she needs this amputated but has not agreed to be referred. She tells me her blood sugar was 600 last night but she has not been febrile. 11/9; she never did got a call from the orthopedic surgeon in [place] therefore that is off the radar. We have arranged to get her see orthopedic surgery at [facility]. She still has a lot of draining purulence coming out of the new abscess in her right leg although that probably came from the osteomyelitis in her right foot and heel. Meanwhile the original wound on the right foot looks very healthy. Continued improvement. The issue is that the last MRI showed osteomyelitis in her right foot extensively she now has an abscess in the right anterior lower leg. There is nobody in [place] who  will offer this woman anything but an amputation and to be honest that is probably what she needs. I think she still wants to talk about limb salvage although at this point I just do not see that. She has completed her vancomycin at dialysis which was for the original staph aureus she is still on cefepime for the more recent Klebsiella. She has had a long course of both of these antibiotics which should have benefited the osteomyelitis on the right foot as well as the abscess. 11/16; apparently [place] elective surgery is shut down because of COVID-19 pandemic. I have reached out to some contacts at [facility] to see if we can get her an orthopedic appointment there. I am concerned about continually leaving this but for the moment everything is static. In fact her original large wound on this foot is closing down. It is the abscess on the right anterior leg that continues to drain purulent serosanguineous material. She is not currently on any antibiotics however she had a prolonged course of vancomycin [1 month] as well as cefepime for a month 02/24/2019 on evaluation today patient appears to be doing better than the last time I saw her.  This is not a patient that I typically see. With that being said I am covering for Dr. Dellia Nims this week and again compared to when I last saw her overall the wounds in particular seem to be doing significantly better which is good news. With that being said the patient tells me several disconcerting things. She has not been able to get in to see anyone for potential debridement in regard to her leg wounds although she tells me that she does not think it is necessary any longer because she is taking care of that herself. She noticed a string coming out of the lower wound on her leg over the last week. The patient states that she subsequently decided that we must of pack something in there and started pulling the string out and as it kept coming and coming she  realized this was likely her tendon. With that being said she continued to remove as much of this as she could. She then I subsequently proceeded to using tubes of antibiotic ointment which she will stick down into the wound and then scored as much as she can until she sees it coming out of the other wound opening. She states that in doing this she is actually made things better and there is less redness and irritation. With regard to her foot wound she does have some necrotic tendon and tissue noted in one small corner but again the actual wound itself seems to be doing better with good granulation in general compared to my last evaluation. 12/7; continued improvement in the wound on the substantial part of the right medial foot. Still a necrotic area inferiorly that required debridement but the rest of this looks very healthy and is contracting. She has 2 wounds on the right lateral leg which were her original drainage sites from her abscess but all of this looks a lot better as well. She has been using silver alginate after putting antibiotic biotic ointment in one wound and watching it come out the other. I have talked to her in some detail today. I had given her names of orthopedic surgeons at [facility] for second opinion on what to do about the right leg. I do not think the patient never called them. She has not been able to get a hold of the orthopedic surgeon in [place] that she had put a lot of faith in as being somebody would give her an opinion that she would trust. I talked to her today and said even if I could get her in to another orthopedic surgeon about the leg which she accept an amputation and she said she would not therefore I am not going to press this issue for the moment 12/14; continued improvement in his substantial wound on the right medial foot. There is still a necrotic area inferiorly with tightly adherent necrotic debris which I have been working on debriding each  time she is here. She does not have an orthopedic appointment. Since last time she was here I looked over her cultures which were essentially MRSA on the foot wound and gram- negative rods in the abscess on the anterior leg. 12/21; continued improvement in the area on the right medial foot. She is not up on this much and that is probably a good thing since I do not know it could support continuous ambulation. She has a small area on the right lateral leg which were remanence of the IandD's I did because of the abscess. I think she should  probably have prophylactic antibiotics I am going to have to look this over to see if we can make an intelligent decision here. In the meantime her major wound is come down nicely. Necrotic area inferiorly is still there but looks a lot better 04/06/2019; she has had some improvement in the overall surface area on the right medial foot somewhat narrowedr both but somewhat longer. The areas on the right lateral leg which were initial IandD sites are superficial. Nothing is present on the right heel. We are using silver alginate to the wound areas 1/18; right medial foot somewhat smaller. Still a deep probing area in the most distal recess of the wound. She has nothing open on the right leg. She has a new wound on the plantar aspect of her left fourth toe which may have come from just pulling skin. The patient using Medihoney on the wound on her foot under silver alginate. I cannot discourage her from this 2/1; 2-week follow-up using silver alginate on the right foot and her left fourth toe. The area on the right dorsal foot is contracted although there is still the deep area in the most distal part of the wound but still has some probing depth. No overt infection 2/15; 2-week follow-up. She continues to have improvement in the surface area on the dorsal right foot. Even the tunneling area from last time is almost closed. The area that was on the plantar part of her  left fourth toe over the PIP is indeed closed 3/1; 2-week follow-up. Continued improvement in surface area. The original divot that we have been debriding inferiorly I think has full epithelialization although the epithelialization is gone down into the wound with probably 4 mm of depth. Even under intense illumination I am unable to see anything open here. The remanence of the wound in this area actually look quite healthy. We have been using silver alginate Objective Constitutional Sitting or standing Blood Pressure is within target range for patient.. Pulse regular and within target range for patient.Marland Kitchen Respirations regular, non-labored and within target range.Marland Kitchen Appears in no distress. Vitals Time Taken: 12:51 PM, Height: 67 in, Weight: 125 lbs, BMI: 19.6, Temperature: 98.3 F, Pulse: 81 bpm, Respiratory Rate: 16 breaths/min, Blood Pressure: 137/70 mmHg. Respiratory work of breathing is normal. Cardiovascular Her Salas pedis pulses are palpable bilaterally. Psychiatric appears at normal baseline. General Notes: Wound exam; continued improvement in the surface of the wound on the dorsal right foot. No debridement is required the divot inferiorly is fully epithelialized although the epithelialization is gone down into the wound with some depth. I cannot identify there is anything open here Integumentary (Hair, Skin) Wound #43 status is Open. Original cause of wound was Gradually Appeared. The wound is located on the Right,Medial Foot. The wound measures 2.5cm length x 2cm width x 0.1cm depth; 3.927cm^2 area and 0.393cm^3 volume. There is Fat Layer (Subcutaneous Tissue) Exposed exposed. There is no tunneling or undermining noted. There is a medium amount of serosanguineous drainage noted. The wound margin is flat and intact. There is medium (34-66%) red, pink, friable granulation within the wound bed. There is a medium (34-66%) amount of necrotic tissue within the wound bed including  Adherent Slough. 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 Dressing Change Frequency: Wound #43 Right,Medial Foot: Change dressing three times week. Wound Cleansing: Clean wound with Wound Cleanser - Anasept to all wounds Primary Wound Dressing: Wound #43 Right,Medial Foot: Calcium Alginate with Silver Other: - may use thin layer of medihoney under alginate Secondary Dressing: Wound #43 Right,Medial Foot: Kerlix/Rolled [name] Dry [name] Edema Control: Elevate legs to the level of the heart or above for 30 minutes daily and/or when sitting, a frequency of: - throughout the day Home Health: [place] skilled [facility] for wound care. - Interim 1. I am continuing with silver alginate. The patient is making progress. 2. She asks about following the condition of the bones in her feet, ambulation etc. I told her that her ambulation should probably be minimal. At some point doing plain x-rays of the bilateral feet might be interesting to determine whether they were capable of handling minimal weightbearing that she is doing now i.e. walking short distances standing to reach something in a high cabinet etc. I do not think it will ever be more than that. Electronic Signature(s) Signed: 06/01/2019 5:45:55 PM By: Linton Ham MD Entered By: Linton Ham on 06/01/2019 14:20:49 -------------------------------------------------------------------------------- SuperBill Details Patient Name: Date of Service: [name] 06/01/2019 Medical Record Number:[id number] Patient Account Number: [id number] Date of Birth/Sex: Treating RN: 1970-04-08 (49 y.o. Nancy Fetter Primary  Care Provider: [name] Other Clinician: Referring Provider: Treating Provider/Extender:Mervin Ramires, [name], [name] in Treatment: 38 Diagnosis Coding ICD-10 Codes Code Description [phone] Other chronic osteomyelitis, right ankle and foot L97.514 Non-pressure chronic ulcer of other part of right foot with necrosis of bone E10.621 Type 1 diabetes mellitus with foot ulcer L97.521 Non-pressure chronic ulcer of other part of left foot limited to breakdown of skin Facility Procedures CPT4 Code: [id number] Description: [facility] VISIT-LEV 3 EST PT Modifier: Quantity: 1 Physician Procedures CPT4 Code Description: [id number] - WC PHYS LEVEL 3 - EST PT ICD-10 Diagnosis Description L97.514 Non-pressure chronic ulcer of other part of right foot M86.671 Other chronic osteomyelitis, right ankle and foot E10.621 Type 1 diabetes mellitus with  foot ulcer Modifier: with necrosis of Quantity: 1 bone Electronic Signature(s) Signed: 06/01/2019 5:57:14 PM By: [name] RN, BSN Signed: 06/02/2019 5:14:23 PM By: Linton Ham MD Previous Signature: 06/01/2019 5:45:55 PM Version By: Linton Ham MD Entered By: [name] on 06/01/2019 17:47:49

## 2019-06-03 ENCOUNTER — Ambulatory Visit (INDEPENDENT_AMBULATORY_CARE_PROVIDER_SITE_OTHER): Payer: Self-pay | Admitting: Physician Assistant

## 2019-06-03 ENCOUNTER — Encounter: Payer: Self-pay | Admitting: Physician Assistant

## 2019-06-03 ENCOUNTER — Ambulatory Visit (HOSPITAL_COMMUNITY)
Admission: RE | Admit: 2019-06-03 | Discharge: 2019-06-03 | Disposition: A | Discharge status: Home / Self Care | Payer: Medicare HMO | Source: Ambulatory Visit | Attending: Vascular Surgery | Admitting: Vascular Surgery

## 2019-06-03 ENCOUNTER — Encounter (HOSPITAL_COMMUNITY): Payer: Medicare HMO

## 2019-06-03 ENCOUNTER — Other Ambulatory Visit: Payer: Self-pay

## 2019-06-03 VITALS — BP 169/87 | HR 84 | Temp 97.3°F | Resp 16 | Ht 67.5 in | Wt 126.0 lb

## 2019-06-03 DIAGNOSIS — N185 Chronic kidney disease, stage 5: Secondary | ICD-10-CM | POA: Diagnosis not present

## 2019-06-03 DIAGNOSIS — Z992 Dependence on renal dialysis: Secondary | ICD-10-CM

## 2019-06-03 DIAGNOSIS — N186 End stage renal disease: Secondary | ICD-10-CM

## 2019-06-03 NOTE — Telephone Encounter (Signed)
After reviewing last office visit note, it DOES indicate that patient checks QID. Pt must have been misinformed by distributor.

## 2019-06-03 NOTE — Telephone Encounter (Signed)
Calling to check on status of Dexcom for the patient to transfer to the Tandem pump.  Patient reports that she heard back yesterday from the new distributor, and that office notes says she is only testing BID.  She says she is testing QID.  She was told to bring her meters in for download.  She says that her second meter is not able to download, but she will get a written log sheet and fill in all of the blood sugar readings for the last month, to show Dr. Dwyane Dee that she is testing QID.  He can then send the addend ed note to the new distributor, and her Dexcom will be covered.

## 2019-06-03 NOTE — Telephone Encounter (Signed)
Please schedule her for office visit which is due now

## 2019-06-03 NOTE — Progress Notes (Signed)
POST OPERATIVE OFFICE NOTE    CC:  F/u for surgery  HPI:  This is a 49 y.o. female who is s/p prior right IJ catheter placement and left for stage basilic vein transposition in May 2020. Now here for follow up exam after second stage basilic transposition 0/93/23 by Dr. Donnetta Hutching.    She denise pain, loss of sensation or loss of motor in her left UE.  Allergies  Allergen Reactions  . Cleocin [Clindamycin Hcl] Diarrhea  . Lisinopril Other (See Comments)    Elevated potassium per pt report  . Amoxicillin Diarrhea, Nausea Only and Other (See Comments)    Did it involve swelling of the face/tongue/throat, SOB, or low BP? No Did it involve sudden or severe rash/hives, skin peeling, or any reaction on the inside of your mouth or nose? No Did you need to seek medical attention at a hospital or doctor's office? No When did it last happen?10 + years If all above answers are "NO", may proceed with cephalosporin use.    . Bactrim [Sulfamethoxazole-Trimethoprim] Diarrhea and Nausea Only    Current Outpatient Medications  Medication Sig Dispense Refill  . albuterol (PROVENTIL HFA;VENTOLIN HFA) 108 (90 BASE) MCG/ACT inhaler Inhale 2 puffs into the lungs every 6 (six) hours as needed for wheezing or shortness of breath.     . APPLE CIDER VINEGAR PO Take 400 mg by mouth daily.    Marland Kitchen aspirin 81 MG tablet Take 81 mg by mouth daily.     . B Complex-C-Zn-Folic Acid (DIALYVITE 557 WITH ZINC) 0.8 MG TABS Take 1 tablet by mouth daily.    . calcitRIOL (ROCALTROL) 0.5 MCG capsule Take 1 capsule daily (Patient taking differently: Take 0.5 mcg by mouth daily. ) 90 capsule 2  . calcium acetate (PHOSLO) 667 MG capsule Take 2 capsules (1,334 mg total) by mouth 3 (three) times daily with meals. 180 capsule 0  . Continuous Blood Gluc Receiver ([place]) [place] 1 each by Does not apply route See admin instructions. Use Dexcom Receiver to monitor blood sugar continuously. 1 each 0  . Continuous Blood Gluc  Sensor (DEXCOM G6 SENSOR) MISC 1 each by Does not apply route See admin instructions. Use one sensors once every 10 days to monitor blood sugars. 9 each 3  . Continuous Blood Gluc Transmit (DEXCOM G6 TRANSMITTER) MISC 1 each by Does not apply route See admin instructions. Use one transmitter once every 90 days. 1 each 3  . CONTOUR NEXT TEST test strip USE AS INSTRUCTED TO CHECK BLOOD SUGAR 4 TIMES A DAY. DX E10.65 100 strip 4  . Cyanocobalamin (B-12) 5000 MCG CAPS Take 5,000 mcg by mouth daily.    Marland Kitchen FLUoxetine (PROZAC) 20 MG tablet Take 20 mg by mouth daily.    Marland Kitchen FOLIC ACID PO Take 1 tablet by mouth daily.    . insulin aspart (NOVOLOG) 100 UNIT/ML injection USE A MAX OF 65 UNITS DAILY VIA INSULIN PUMP. 20 mL 2  . insulin lispro (HUMALOG) 100 UNIT/ML injection Inject 9 Units into the skin daily. Given via insulin pump    . norethindrone (AYGESTIN) 5 MG tablet Take 5 mg by mouth every 14 (fourteen) days.     . Omega-3 Fatty Acids (OMEGA 3 500) 500 MG CAPS Take 500 mg by mouth daily.    . pravastatin (PRAVACHOL) 40 MG tablet TAKE 1 TABLET BY MOUTH EVERY DAY (Patient taking differently: Take 40 mg by mouth daily. ) 90 tablet 1  . sulfamethoxazole-trimethoprim (BACTRIM DS) 800-160  MG tablet Take 1 tablet by mouth daily.    Marland Kitchen amLODipine (NORVASC) 5 MG tablet Take 1 tablet (5 mg total) by mouth daily. (Patient not taking: Reported on 06/03/2019) 30 tablet 0  . oxyCODONE-acetaminophen (PERCOCET/ROXICET) 5-325 MG tablet Take 1 tablet by mouth every 6 (six) hours as needed. (Patient not taking: Reported on 06/03/2019) 12 tablet 0   No current facility-administered medications for this visit.     ROS:  See HPI  Physical Exam: Findings:  +--------------------+----------+-----------------+------------------------  -+  AVF         PSV (cm/s)Flow Vol (mL/min)    Comments        +--------------------+----------+-----------------+------------------------  -+  Native artery inflow  98       144                    +--------------------+----------+-----------------+------------------------  -+  AVF Anastomosis     357           small diamater &  tortuous  +--------------------+----------+-----------------+------------------------  -+     +------------+----------+-------------+----------+-----------------+  OUTFLOW VEINPSV (cm/s)Diameter (cm)Depth (cm)  Describe     +------------+----------+-------------+----------+-----------------+  Shoulder    170    0.31     0.51             +------------+----------+-------------+----------+-----------------+  Prox UA     152    0.34     0.29             +------------+----------+-------------+----------+-----------------+  Mid UA     121    0.42     0.19             +------------+----------+-------------+----------+-----------------+  Dist UA   169 / 615   0.55     0.17  abnormal waveform  +------------+----------+-------------+----------+-----------------+        Summary:  Patent right arteriovenous fistula with increased velocity in the distal  outflow  vein. There appears to be thrombus in the mid to distal outflow vein,  limited  visualization.   Vitals:   06/03/19 1524  BP: (!) 169/87  Pulse: 84  Resp: 16  Temp: (!) 97.3 F (36.3 C)  SpO2: 100%     Incision:  Well healed Extremities:  Sensation and motor of left UE Lungs: non labored breathing   Assessment/Plan:  This is a 49 y.o. 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  At this time we will schedule her for left UE AV graft.  She has a right TDC that is working well for HD TTS.     She has chronic osteomyelitis of the left foot and will be on oral antibiotics for life.  This was discussed with Dr. Oneida Alar and he agrees to proceed with AV graft plan.    Roxy Horseman PA-C [facility] [phone]  Clinic MD:  Oneida Alar

## 2019-06-04 NOTE — Telephone Encounter (Signed)
Called [facility] to confirm that chart notes were received that indicate the patient is checking her blood sugar four times daily. Representative named Claiborne Billings stated that the chart notes were received.  Also inquired as to why these were requested and Claiborne Billings stated that the chart notes were requested for the Dexcom full system, including Receiver, Transmitter, and Sensors.  Claiborne Billings stated that all documents meet requirements and guidelines of the insurance plan.  [facility] is currently in the stage of contacting the patient to confirm this information and then ship the supplies.  I did infer as to whether patient has been contacted yet, and Claiborne Billings stated that their account shows that the patient prefers text message and as of today, the patient has been sent one message requesting that she return a call to them to confirm all of this information. They have no yet received a call back from the patient.  I then contacted patient and left a detailed voicemail and relayed the urgency of the message and stated that it was imperative that the patient contact this office as soon as she possibly can. I then attempted to contact patient's sister listed on the chart and her phone rang for several minutes and never defaulted to a voicemail. Will attempt to contact the patient again today.

## 2019-06-04 NOTE — Telephone Encounter (Signed)
Possibly they do not have current note????  She has been waiting over 3 months for this Dexcom, and I have no more infusions sets to give her for her [employer] supplies.  If she orders more Medtronic supplies, they will not ship the Tandem supplies, and that is another problem we will have-- And I do not have any to give her.  Please research this.  We need her to start on this pump ASAP.  This ordeal has taken a toll on all of [place] with time spent, and frustration. I can possibly give her a sample Dexcom, but it has no receiver and her phone may not support this.  But I can check on this, if this process continues to be delayed. Please let me know what you want me to do.    Thank you

## 2019-06-04 NOTE — Telephone Encounter (Signed)
On 06/01/19 we received a fax from [facility] supply requesting the patient's last office visit note. (Assuming this is the company that her insurance allows her to use) The last office visit note was  sent with confirmation confirmed. Will contact them as well as [employer] and Dexcom if needed to find out when pt will be getting shipment of supplies to start on the Tandem pump.

## 2019-06-04 NOTE — Telephone Encounter (Signed)
Pt returned phone call and she was given all info below and told her that she needs to contact [facility] supply, and she was given their telephone number so she can contact them and give them verbal consent to ship supplies. Pt verbalized understanding. Also asked pt if she had access to her MyChart account to which she responded yes. I requested of patient that once she speaks with [facility], that she send this office a message via her mychart account notifying us that she did get in touch with them and that everything is set to be shipped to her. Pt verbalized understanding and stated that she would do this.

## 2019-06-05 ENCOUNTER — Other Ambulatory Visit: Payer: Self-pay

## 2019-06-05 NOTE — Telephone Encounter (Signed)
Called pt and left voicemail for pt requesting a call back because as of this time, pt has not sent a mychart message informing this office that she has spoke with [facility]. Will attempt to contact pt again.

## 2019-06-06 NOTE — Progress Notes (Signed)
Called pt today to reschedule her covid test, as she was scheduled one day too early. Pt sts that she will be calling her surgeon to reschedule her surgery for a later date, any way.

## 2019-06-08 ENCOUNTER — Other Ambulatory Visit: Payer: Self-pay | Admitting: Endocrinology

## 2019-06-08 ENCOUNTER — Inpatient Hospital Stay (HOSPITAL_COMMUNITY)
Admission: RE | Admit: 2019-06-08 | Discharge: 2019-06-08 | Disposition: A | Discharge status: Home / Self Care | Payer: Medicare HMO | Source: Ambulatory Visit

## 2019-06-08 NOTE — Telephone Encounter (Signed)
Pt called office and stated that she contacted [facility] last week and they told her that they do not provide the Dexcom system to her, but they will find someone that will. Pt stated that she has been in contact with several companies since then.  Pt was informed that I would contact [facility] myself because if this is what she was told, it contradicts what I was told last week. After speaking with Raquel at [facility], it appears that pt either misunderstood or was misinformed. Raquel stated that same information that Claiborne Billings stated last week. Pt's shipment of Dexcom system and supplies is ready for shipment once they receive verbal confirmation to do so from the patient. Raquel stated that they attempted to contact patient this morning and did not receive an answer. Pt reviewed her missed calls while on the phone with this office and did stated that she had two missed calls from them. Pt was instructed to call [facility] back and give them verbal consent to ship her supplies, and if they tell her anything different that what I have been told and relayed to her, she was instructed to have [place] call this office and conference me into the call so all of [place] can speak about this ongoing issues directly to eliminate any further issues. Pt was given my direct line in the event [employer] has to call this office.

## 2019-06-12 ENCOUNTER — Ambulatory Visit (HOSPITAL_COMMUNITY): Admission: RE | Admit: 2019-06-12 | Payer: Medicare HMO | Source: Home / Self Care | Admitting: Vascular Surgery

## 2019-06-12 ENCOUNTER — Encounter (HOSPITAL_COMMUNITY): Admission: RE | Payer: Self-pay | Source: Home / Self Care

## 2019-06-12 SURGERY — INSERTION OF ARTERIOVENOUS (AV) GORE-TEX GRAFT ARM
Anesthesia: Monitor Anesthesia Care | Laterality: Left

## 2019-06-15 ENCOUNTER — Encounter (HOSPITAL_BASED_OUTPATIENT_CLINIC_OR_DEPARTMENT_OTHER): Payer: Medicare HMO | Admitting: Internal Medicine

## 2019-06-15 ENCOUNTER — Ambulatory Visit (HOSPITAL_COMMUNITY)
Admission: RE | Admit: 2019-06-15 | Discharge: 2019-06-15 | Disposition: A | Discharge status: Home / Self Care | Payer: Medicare HMO | Source: Ambulatory Visit | Attending: Internal Medicine | Admitting: Internal Medicine

## 2019-06-15 ENCOUNTER — Other Ambulatory Visit: Payer: Self-pay

## 2019-06-15 ENCOUNTER — Other Ambulatory Visit (HOSPITAL_COMMUNITY): Payer: Self-pay | Admitting: Internal Medicine

## 2019-06-15 DIAGNOSIS — M79604 Pain in right leg: Secondary | ICD-10-CM | POA: Insufficient documentation

## 2019-06-15 DIAGNOSIS — E10621 Type 1 diabetes mellitus with foot ulcer: Secondary | ICD-10-CM | POA: Diagnosis not present

## 2019-06-16 NOTE — Progress Notes (Addendum)
NICOLETTA, HUSH ([id number]) Visit Report for 06/15/2019 Arrival Information Details Patient Name: Date of Service: RODERICK, SWEEZY 06/15/2019 1:00 PM Medical Record Number:[id number] Patient Account Number: [id number] Date of Birth/Sex: Treating RN: 04/24/1970 (49 y.o. [name], Meta.Reding Primary Care Brandolyn Shortridge: [name] Other Clinician: Referring Smaran Gaus: Treating Tamre Cass/Extender:[name], [name], [name] in Treatment: 50 Visit Information History Since Last Visit Added or deleted any medications: No Patient Arrived: Wheel Chair Any new allergies or adverse reactions: No Arrival Time: 12:41 Had a fall or experienced change in No activities of daily living that may affect Accompanied By: self risk of falls: Transfer Assistance: None Signs or symptoms of abuse/neglect since last No Patient Identification Verified: Yes visito Secondary Verification Process Completed: Yes Hospitalized since last visit: No Patient Requires Transmission-Based No Implantable device outside of the clinic excluding No Precautions: cellular tissue based products placed in the center Patient Has Alerts: No since last visit: Has Dressing in Place as Prescribed: Yes Pain Present Now: No Electronic Signature(s) Signed: 06/15/2019 5:25:09 PM By: Deon Pilling Entered By: Deon Pilling on 06/15/2019 12:49:20 -------------------------------------------------------------------------------- Encounter Discharge Information Details Patient Name: Date of Service: [name] 06/15/2019 1:00 PM Medical Record Number:[id number] Patient Account Number: [id number] Date of Birth/Sex: Treating RN: February 19, 1971 (49 y.o. [name] Primary Care Anel Purohit: [name] Other Clinician: Referring Jovanny Stephanie: Treating Daniqua Campoy/Extender:[name], [name], [name] in Treatment: 53 Encounter Discharge Information Items Post Procedure Vitals Discharge Condition:  Stable Temperature (F): 98.1 Ambulatory Status: Wheelchair Pulse (bpm): 89 Discharge Destination: Home Respiratory Rate (breaths/min): 18 Transportation: Private Auto Blood Pressure (mmHg): 173/88 Accompanied By: self Schedule Follow-up Appointment: Yes Clinical Summary of Care: Patient Declined Electronic Signature(s) Signed: 06/15/2019 5:25:09 PM By: Deon Pilling Entered By: Deon Pilling on 06/15/2019 13:16:32 -------------------------------------------------------------------------------- Lower Extremity Assessment Details Patient Name: Date of Service: SKARLETH, DELMONICO 06/15/2019 1:00 PM Medical Record Number:[id number] Patient Account Number: [id number] Date of Birth/Sex: Treating RN: Aug 05, 1970 (49 y.o. [name] Primary Care Ellard Nan: [name] Other Clinician: Referring Sabri Teal: Treating Kailan Laws/Extender:[name], [name], [name] in Treatment: 40 Edema Assessment Assessed: [Left: No] [Right: Yes] Edema: [Left: N] [Right: o] Calf Left: Right: Point of Measurement: cm From Medial Instep cm 25.5 cm Ankle Left: Right: Point of Measurement: cm From Medial Instep cm 18 cm Electronic Signature(s) Signed: 06/15/2019 5:25:09 PM By: Deon Pilling Entered By: Deon Pilling on 06/15/2019 12:50:02 -------------------------------------------------------------------------------- Multi Wound Chart Details Patient Name: Date of Service: [name] 06/15/2019 1:00 PM Medical Record Number:[id number] Patient Account Number: [id number] Date of Birth/Sex: Treating RN: 1970/05/01 (49 y.o. 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] Comorbid History: [43:Cataracts, Chronic sinus N/A problems/congestion, Anemia, Sleep Apnea, Deep Vein Thrombosis, Hypertension, Peripheral Arterial Disease, Type I Diabetes, Osteoarthritis, Osteomyelitis, Neuropathy, Seizure Disorder] Date Acquired: [43:08/04/2018] [N/A:N/A] Weeks of Treatment: [43:40] [N/A:N/A] Wound Status: [43:Open] [N/A:N/A] Clustered Wound: [43:Yes] [N/A:N/A] Clustered Quantity: [43:1] [N/A:N/A] Measurements L x W x D 4.1x2.5x0.2 [N/A:N/A] (cm) Area (cm) : [43:8.05] [N/A:N/A] Volume (cm) : [43:1.61] [N/A:N/A] % Reduction in Area: [43:88.90%] [N/A:N/A] % Reduction in Volume: 92.60% [N/A:N/A] Classification: [43:Grade 4] [N/A:N/A] Exudate Amount: [43:Medium] [N/A:N/A] Exudate Type: [43:Serosanguineous] [N/A:N/A] Exudate Color: [43:red, brown] [N/A:N/A] Wound  Margin: [43:Flat and Intact] [N/A:N/A] Granulation Amount: [43:Medium (34-66%)] [N/A:N/A] Granulation Quality: [43:Red, Pink] [N/A:N/A] Necrotic Amount: [43:Medium (34-66%)] [N/A:N/A] Exposed Structures: [43:Fat Layer (Subcutaneous N/A Tissue) Exposed: Yes Fascia: No Tendon: No Muscle: No Joint: No Bone: No] Epithelialization: [43:Medium (34-66%)] [N/A:N/A] Debridement: [43:Debridement - Excisional N/A] Pre-procedure [43:12:58] [N/A:N/A] Verification/Time Out Taken: Tissue Debrided: [43:Subcutaneous, Slough] [N/A:N/A] Level: [43:Skin/Subcutaneous Tissue N/A] Debridement Area (sq cm):10.25 [N/A:N/A] Instrument: [43:Curette] [N/A:N/A] Bleeding: [43:Minimum] [N/A:N/A] Hemostasis Achieved: [43:Pressure] [N/A:N/A] Procedural Pain: [43:0] [N/A:N/A] Post Procedural Pain: [43:0] [N/A:N/A] Debridement Treatment [43:Procedure was tolerated] [N/A:N/A] Response: [43:well] Post Debridement [43:4.1x2.5x0.2]  [N/A:N/A] Measurements L x W x D (cm) Post Debridement [43:1.61] [N/A:N/A] Volume: (cm) Procedures Performed: [43:Debridement] [N/A:N/A] Treatment Notes Wound #43 (Right, Medial Foot) 1. Cleanse With Wound Cleanser 3. Primary Dressing Applied Calcium Alginate Ag 4. Secondary Dressing Dry Gauze 6. Support Layer Psychologist, forensic Notes ace wrap patient request. Engineer, maintenance) Signed: 06/15/2019 5:44:18 PM By: [name] RN, BSN Signed: 06/16/2019 10:32:20 AM By: Linton Ham MD Entered By: Linton Ham on 06/15/2019 13:29:20 -------------------------------------------------------------------------------- Multi-Disciplinary Care Plan Details Patient Name: Date of Service: [name] 06/15/2019 1:00 PM Medical Record Number:[id number] Patient Account Number: [id number] Date of Birth/Sex: Treating RN: [birth year] (49 y.o. Nancy Fetter Primary Care Haider Hornaday: [name] Other Clinician: Referring Bryceson Grape: Treating Saryn Cherry/Extender:[name], [name], [name] in Treatment: 40 Active Inactive Wound/Skin Impairment Nursing Diagnoses: Impaired tissue integrity Knowledge deficit related to ulceration/compromised skin integrity Goals: Patient/caregiver will verbalize understanding of skin care regimen Date Initiated: 09/03/2018 Target Resolution Date: 07/03/2019 Goal Status: Active Ulcer/skin breakdown will have a volume reduction of 30% by week 4 Date Initiated: 09/03/2018 Date Inactivated: 10/07/2018 Target Resolution Date: 10/01/2018 Unmet Goal Status: Unmet Reason: COMORBITIES Ulcer/skin breakdown will have a volume reduction of 50% by week 8 Date Initiated: 10/07/2018 Date Inactivated: 11/03/2018 Target Resolution Date: 10/31/2018 Unmet Goal Status: Unmet Reason: Osteomyelitis Interventions: Assess patient/caregiver ability to obtain necessary supplies Assess patient/caregiver ability to perform ulcer/skin care regimen upon admission and  as needed Assess ulceration(s) every visit Provide education on ulcer and skin care Treatment Activities: Skin care regimen initiated : 09/03/2018 Topical wound management initiated : 09/03/2018 Notes: Electronic Signature(s) Signed: 06/15/2019 5:44:18 PM By: [name] RN, BSN Entered By: [name] on 06/15/2019 12:58:54 -------------------------------------------------------------------------------- Pain Assessment Details Patient Name: Date of Service: [name] 06/15/2019 1:00 PM Medical Record Number:[id number] Patient Account Number: [id number] Date of Birth/Sex: Treating RN: 01-01-1971 (49 y.o. [name] Primary Care Troye Hiemstra: [name] Other Clinician: Referring Shaneya Taketa: Treating Peyten Weare/Extender:[name], [name], [name] in Treatment: 40 Active Problems Location of Pain Severity and Description of Pain Patient Has Paino No Site Locations Rate the pain. Current Pain Level: 1 Pain Management and Medication Current Pain Management: Medication: No Cold Application: No Rest: No Massage: No Activity: No T.E.N.S.: No Heat Application: No Leg drop or elevation: No Is the Current Pain Management Adequate: Adequate How does your wound impact your activities of daily livingo Sleep: No Bathing: No Appetite: No Relationship With Others: No Bladder Continence: No Emotions: No Bowel Continence: No Work: No Toileting: No Drive: No Dressing: No Hobbies: No Electronic Signature(s) Signed: 06/15/2019 5:25:09 PM By: Deon Pilling Entered By: Deon Pilling on 06/15/2019 12:49:51 -------------------------------------------------------------------------------- Patient/Caregiver Education Details Patient Name: Date of Service: [name] 3/15/2021andnbsp1:00 PM Medical Record Number:[id number] Patient Account Number: [id number] Date of Birth/Gender: Treating RN: 1970/12/21 (49 y.o. Nancy Fetter Primary Care Physician:  [name] Other Clinician: Referring Physician: Treating Physician/Extender:[name], [name], [name] in Treatment:  40 Education Assessment Education Provided To: Patient Education Topics Provided Wound/Skin Impairment: Methods: Explain/Verbal Responses: State content correctly Electronic Signature(s) Signed: 06/15/2019 5:44:18 PM By: [name] RN, BSN Entered By: [name] on 06/15/2019 12:59:18 -------------------------------------------------------------------------------- Wound Assessment Details Patient Name: Date of Service: [name] 06/15/2019 1:00 PM Medical Record Number:[id number] Patient Account Number: [id number] Date of Birth/Sex: Treating RN: Sep 10, 1970 (49 y.o. Nancy Fetter Primary Care Cuauhtemoc Huegel: [name] Other Clinician: Referring Gerhardt Gleed: Treating Hajar Penninger/Extender:[name], [name], [name] in Treatment: 40 Wound Status Wound Number: 43 Primary Diabetic Wound/Ulcer of the Lower Extremity Etiology: Wound Location: Right Foot - Medial Wound Open Wounding Event: Gradually Appeared Status: Date Acquired: 08/04/2018 Comorbid Cataracts, Chronic sinus problems/congestion, Weeks Of Treatment: 40 History: Anemia, Sleep Apnea, Deep Vein Thrombosis, Clustered Wound: Yes Hypertension, Peripheral Arterial Disease, Type I Diabetes, Osteoarthritis, Osteomyelitis, Neuropathy, Seizure Disorder Photos Wound Measurements Length: (cm) 4.1 Width: (cm) 2.5 Depth: (cm) 0.2 Clustered Quantity: 1 Area: (cm) 8.05 Volume: (cm) 1.61 Wound Description Classification: Grade 4 Wound Margin: Flat and Intact Exudate Amount: Medium Exudate Type: Serosanguineous Exudate Color: red, brown Wound Bed Granulation Amount: Medium (34-66%) Granulation Quality: Red, Pink Necrotic Amount: Medium (34-66%) Necrotic Quality: Adherent Slough r After Cleansing: No ibrino Yes Exposed Structure posed: No (Subcutaneous Tissue)  Exposed: Yes posed: No posed: No osed: No sed: No % Reduction in Area: 88.9% % Reduction in Volume: 92.6% Epithelialization: Medium (34-66%) Tunneling: No Undermining: No Foul Odo Slough/F Fascia Ex Fat Layer Tendon Ex Muscle Ex Joint Exp Bone Expo Electronic Signature(s) Signed: 06/16/2019 4:45:30 PM By: [name] EMT/HBOT Signed: 07/15/2019 9:03:51 AM By: [name] RN, BSN Previous Signature: 06/15/2019 5:25:09 PM Version By: Deon Pilling Previous Signature: 06/15/2019 5:25:09 PM Version By: Deon Pilling Entered By: [name] on 06/16/2019 15:28:48 -------------------------------------------------------------------------------- Vitals Details Patient Name: Date of Service: [name] 06/15/2019 1:00 PM Medical Record Number:[id number] Patient Account Number: [id number] Date of Birth/Sex: Treating RN: 1970-04-07 (49 y.o. [name], Meta.Reding Primary Care Elvi Leventhal: [name] Other Clinician: Referring Raynell Upton: Treating Kinzly Pierrelouis/Extender:[name], [name], [name] in Treatment: 40 Vital Signs Time Taken: 12:42 Temperature (F): 98.1 Height (in): 67 Pulse (bpm): 89 Weight (lbs): 125 Respiratory Rate (breaths/min): 18 Body Mass Index (BMI): 19.6 Blood Pressure (mmHg): 173/88 Reference Range: 80 - 120 mg / dl Electronic Signature(s) Signed: 06/15/2019 5:25:09 PM By: Deon Pilling Entered By: Deon Pilling on 06/15/2019 12:49:38

## 2019-06-16 NOTE — Progress Notes (Signed)
[name], KIESEL ([id number]) Visit Report for 06/15/2019 Debridement Details Patient Name: Date of Service: [name], ISLAND 06/15/2019 1:00 PM Medical Record Number:[id number] Patient Account Number: [id number] Date of Birth/Sex: Treating RN: [birth year] (49 y.o. Nancy Fetter Primary Care Provider: [name] Other Clinician: Referring Provider: Treating Provider/Extender:Jinan Biggins, [name], [name] in Treatment: 40 Debridement Performed for Wound #43 Right,Medial Foot Assessment: Performed By: Physician Ricard Dillon., MD Debridement Type: Debridement Severity of Tissue Pre Fat layer exposed Debridement: Level of Consciousness (Pre- Awake and Alert procedure): Pre-procedure Verification/Time Out Taken: Yes - 12:58 Start Time: 12:58 Total Area Debrided (L x W): 4.1 (cm) x 2.5 (cm) = 10.25 (cm) Tissue and other material Viable, Non-Viable, Slough, Subcutaneous, Slough debrided: Level: Skin/Subcutaneous Tissue Debridement Description: Excisional Instrument: Curette Bleeding: Minimum Hemostasis Achieved: Pressure End Time: 13:00 Procedural Pain: 0 Post Procedural Pain: 0 Response to Treatment: Procedure was tolerated well Level of Consciousness Awake and Alert (Post-procedure): Post Debridement Measurements of Total Wound Length: (cm) 4.1 Width: (cm) 2.5 Depth: (cm) 0.2 Volume: (cm) 1.61 Character of Wound/Ulcer Post Improved Debridement: Severity of Tissue Post Debridement: Fat layer exposed Post Procedure Diagnosis Same as Pre-procedure Electronic Signature(s) Signed: 06/15/2019 5:44:18 PM By: [name] RN, BSN Signed: 06/16/2019 10:32:20 AM By: Linton Ham MD Entered By: Linton Ham on 06/15/2019 13:29:30 -------------------------------------------------------------------------------- HPI Details Patient Name: Date of Service: [name] 06/15/2019 1:00 PM Medical Record Number:[id number] Patient Account  Number: [id number] Date of Birth/Sex: Treating RN: [birth year] (49 y.o. Nancy Fetter Primary Care Provider: [name] Other Clinician: Referring Provider: Treating Provider/Extender:Amaiyah Nordhoff, [name], [name] in Treatment: 40 History of Present Illness HPI Description: 49 year old diabetic who is known to have type 1 diabetes which is poorly controlled last hemoglobin A1c was 11%. She comes in with a ulcerated area on the left lateral foot which has been there for over 6 months. Was recently she has been treated by [name] of podiatry who saw her last on 05/28/2016. Review of his notes revealed that the patient had incision and drainage with placement of antibiotic beads to the left foot on 04/11/2016 for possible osteomyelitis of the cuboid bone. Over the last year she's had a history of amputation of the left fifth toe and a femoropopliteal popliteal bypass graft somewhere in April 2017. 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 His note [name] mentions that the patient has been referred to me for further wound care and possibly great candidate for hyperbaric oxygen therapy due to recurrent osteomyelitis. However we do not have any x-rays of biopsy reports confirming this. He has been on several antibiotics including Bactrim and most recently is on doxycycline for an MRSA. I understand, the patient was not a candidate for IV antibiotics as she has had previous PICC lines which resulted in blood clots in both arms. There was a x-ray report dated 04/04/2016 on [name] notes which showed evidence of fifth ray resection left foot with osteolytic changes noted to the fourth metatarsal and cuboid bone on the left. 06/13/2016 -- had a left foot x-ray which showed no acute fracture or dislocation and no definite radiographic evidence of osteomyelitis. Advanced osteopenia was seen. 06/20/2016 -- she has noticed a new wound on the right plantar foot in the  region where she had a callus before. 06/27/16- the patient did have her x-ray of the right foot which showed no findings to suggest osteomyelitis. She saw her endocrinologist, [name], yesterday. Her A1c in January was 11. He  also indicates mismanagement and noncompliance regarding her diabetes. She is currently on Bactrim for a lip infection. She is complaining of nausea, vomiting and diarrhea. She is unable to articulate the exact orders or dosing of the Bactrim; it is unclear when she will complete this. 07/04/2016 -- results from [facility] of ABIs with ankle waveforms were noted from 02/14/2016. The examination done on 06/27/2015 showed noncompressible ABIs with the right being 1.45 and the left being 1.33. The present examination showed a right ABI of 1.19 on the left of 1.33. The conclusion was that right normal ABI in the lower extremity at rest however compared to previous study which was noncompressible ABI may be falsely elevated side suggesting medial calcification. The left ABI suggested medial calcification. 08/01/2016 -- the patient had more redness and pain on her right foot and did not get to come to see as noted she see her PCP or go to the ER and decided to take some leftover metronidazole which she had at home. As usual, the patient does report she feels and is rather noncompliant. 08/08/2016 -- -- x-ray of the right foot -- FINDINGS:Transmetatarsal amputation is noted. No bony destruction is noted to suggest osteomyelitis. IMPRESSION: No evidence of osteomyelitis. Postsurgical changes are seen. MRI would be more sensitive for possible bony changes. Culture has grown Serratia Marcescens -- sensitive to Bactrim, ciprofloxacin, ceftazidime she was seen by [name] on 08/06/2016. He did not find any exposed bone, muscle, tendon, ligament or joint. There was no malodor and he did a excisional debridement in the office. ============ Old notes: 50 year old patient who  is known to the wound clinic for a while had been away from the wound clinic since 09/01/2014. Over the last several months she has been admitted to various hospitals including [facility]. She was treated for a right metatarsal osteomyelitis with a transmetatarsal amputation and this was done about 2 months ago. He has a small ulcerated area on the right heel and she continues to have an ulcerated area on the left plantar aspect of the foot. The patient was recently admitted to the [facility] hospital group between 7/12 and 10/18/2014. she was given 3 weeks of IV vancomycin and was to follow-up with her surgeons at [facility] and also took oral vancomycin for C. difficile colitis. 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 MRI of the left foot done on 10/14/2014 showed no abscess or osteomyelitis. 04/27/15; this is a patient we know from previous stays in the wound care center. She is a type I diabetic I am not sure of her control currently. Since the last time I saw her she is had a right transmetatarsal amputation and has no wounds on her right foot and has no open wounds. She is been followed at the wound care center at [facility] in [place]. She comes today with the desire to undergo hyperbaric treatment locally. Apparently one of her wound care providers in [place] has suggested hyperbarics. This is in response to an MRI from 04/18/15 that showed increased marrow signal and loss of the proximal fifth metatarsal cortex evidence of osteomyelitis with likely early osteomyelitis in the cuboid bone as well. She has a large wound over the base of the fifth metatarsal. She also has a eschar  over her  the tips of her toes on 1,3 and 5. She does not have peripheral pulses and apparently is going for an angiogram tomorrow which seems reasonable. After this she is going to infectious disease at [facility]. They have been using Medihoney to the large wound on the lateral aspect of the left foot to. The patient has known Charcot deformity from diabetic neuropathy. She also has known diabetic PAD. Surprisingly I can't see that she has had any recent antibiotics, the patient states the last antibiotic she had was at the end of November for 10 days. I think this was in response to culture that showed group G strep although I'm not exactly sure where the culture was from. She is also had arterial studies on 03/29/15. This showed a right ABI of 1.4 that was noncompressible. Her left ABI was 0.73. 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 It was not felt that arterial inflow was adequate for healing of a foot ulcer. Her Doppler waveforms looked monophasic ===== READMISSION 02/28/17; this is in an now 49 year old woman we've had at several different occasions in this clinic. 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 She has a remote ex-smoker. She was last seen in this clinic by Dr. Con Memos I think in May. More recently she is been followed by her podiatrist [name] an infectious disease Dr. Megan Salon. She has 2 open wounds the major one is over the right first metatarsal head she also has a wound on the left plantar foot. an MRI of the right foot on 01/01/17 showed a soft tissue ulcer along the plantar aspect of the first metatarsal base consistent with osteomyelitis of the first metatarsal stump. Dr. 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 According to the patient this is been open for slightly over a month. She has been on a combination of Cipro 500 twice a  day, Zyvox 600 twice a day and Flagyl 500 3 times a day for over a month now as directed by Dr. Megan Salon. cultures of the right foot earlier this year showed MRSA in January and Serratia in May. January also had a few viridans strep. Recent x-rays of both feet were done and [name] office and I don't have these reports. The patient has known PAD and has a history of aleft femoropopliteal bypass in April 2017. 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 Dwyane Dee. In spite of this the last hemoglobin A1c I can see is 10.1 on 01/01/2017. She is being referred by 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 She is been using Medihoney to this area and also an area on the left midfoot. She is using healing sandals bilaterally. ABIs in this clinic at the left posterior tibial was 1.1 noncompressible on the right READMISSION Non invasive vascular NOVANT 5/18 Aftercare following surgery of the circulatory system Procedure Note - Interface, External Ris In - 08/13/2016 11:05 AM EDT Procedure: Examination consists of physiologic resting arterial pressures of the brachial and ankle arteries bilaterally with continuous wave Doppler waveform analysis. Previous: Previous exam performed on 02/14/16 demonstrated ABIs of Rt = 1.19 and Lt = 1.33. Right: ABI = non-compressible  PT, 1.47 DP. S/P transmet amputation. Left: ABI = 1.52, 2nd digit pressure = 87 mmHg Conclusions: Right: ABI (>1.3) may be falsely elevated, suggesting medial calcification. Left: ABI (>1.3) may be falsely elevated, suggesting medial calcification The patient is a now 49 year old type I diabetic is had multiple issues her graded to chronic diabetic foot ulcers. She has had a previous right  transmetatarsal amputation fifth ray amputation. She had Charcot feet diabetic polyneuropathy. We had her in the clinic lastin November. At that point she had wounds on her bilateral feet.she had wanted to try hyperbarics however the healogics review process denied her because she hadn't followed up with her vascular surgeon for her left femoropopliteal bypass. The bypass was done by Dr. Raul Del at [facility]. We made her a follow-up with Dr. Raul Del however she did not keep the appointment and therefore she was not approved The patient shows me a small wound on her left fourth metatarsal head on her phone. She developed rapid discoloration in the plantar aspect of the left foot and she was admitted to hospital from 2/2 through 05/10/17 with wet gangrene of the left foot osteomyelitis of the fourth metatarsal heads. She was admitted acutely ill with a temperature of 103. She was started on broad-spectrum vancomycin and cefepime. On 05/06/17 she was taken to the OR by [name] her podiatric surgeon for an incision and drainage irrigation of the left foot wound. Cultures from this surgery revealed group be strep and anaerobes. she was seen by Dr.Xu of orthopedic surgery and scheduled for a below-knee amputation which she u refused. Ultimately she was discharged on Levaquin and Flagyl for one month. MRI 05/05/17 done while she was in the hospital showed abscess adjacent to the fourth metatarsal head and neck small abscess around the fourth flexor tendon. Inflammatory phlegmon and gas in the soft tissues along the lateral aspect of the fourth phalanx. Findings worrisome for osteomyelitis involving the fourth proximal and middle phalanx and also the third and fourth metatarsals. Finally the patient had actually shortly before this followed up with Dr. Raul Del at no time on 04/29/17. He felt that her left femoropopliteal bypass was patent he felt that her left-sided toe pressures more than adequate for  healing a wound on the left foot. This was before her acute presentation. 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 05/28/17; she is started hyperbarics. The patient tells me that for some reason she was not actually on Levaquin but I think on ciprofloxacin. She was on Flagyl. She only started her Levaquin yesterday due to some difficulty with the pharmacy and perhaps her sister picking it up. She has an appointment with [name] tomorrow and with infectious disease early next week. She has no new complaints 06/06/17; the patient continues in hyperbarics. She saw [name] on 05/29/17 who is her podiatric surgeon. He is elected for a transmetatarsal amputation on 06/27/17. I'm not sure at what level he plans to do this amputation. The patient is unaware She also saw Dr. Megan Salon of infectious disease who elected to continue her on current antibiotics I think this is ciprofloxacin and Flagyl. I'll need to clarify with her tomorrow if she actually has this. We're using silver alginate to the actual wound. Necrotic surface today with material under the flap of her foot. Original MRI showed abscesses as well as osteomyelitis of the proximal and middle fourth phalanx and the third and fourth metatarsal heads 06/11/17; patient continues in hyperbarics and continues on oral antibiotics. She is doing well.  The wound looks better. The necrotic part of this under the flap in her superior foot also looks better. she is been to see [name]. I haven't had a chance to look at his note. Apparently he has put the transmetatarsal amputation on hold her request it is still planning to take her to the OR for debridement and product application ACEL. I'll see if I can find his note. I'll therefore leave product ordering/requests to [name] for now. I was going to look at Dermagraft 06/18/17-she is here in follow-up evaluation for bilateral foot wounds. She continues with hyperbaric therapy. She states she has been  applying manuka honey to the right plantar foot and alternate manuka honey and silver alginate to the left foot, despite our orders. We will continue with same treatment plan and she will follo up next week. 06/25/17; I have reviewed [name] last note from 3/11. She has operative debridement in 2 days' time. By review his note apparently they're going to place there is skin over the majority of this wound which is a good choice. She has a small satellite area at the most proximal part of this wound on the left plantar foot. The area on the right plantar foot we've been using silver alginate and it is close to healing. 07/02/17; unfortunately the patient was not easily approved for [name] proposed surgery. I'm not completely certain what the issue is. She has been using silver alginate to the wound she has completed a first course of hyperbarics. She is still on Levaquin and Flagyl. I have really lost track of the time course here.I suspect she should have another week to 2 of antibiotics. I'll need to see if she is followed up with infectious disease Dr. Megan Salon 07/09/17; the patient is followed up with Dr. Megan Salon. 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 She continues on Levaquin and metronidazole continuing both of these for now I think she is been on fr about 6 weeks. She still has some drainage but no pain. No fever. Her had been plans for her to go to the OR for operative debridement with her podiatrist [name], I am not exactly sure where that is. I'll probably slip a note to [name] today. I note that she follows with Dr. Dwyane Dee of endocrinology. We have her recertified for hyperbaric oxygen. I have not heard about Dermagraft however I'll see if [name] is planning a skin substitute as well 07/16/17; the patient tells me she is just about out of Wheeler. I'll need to check Dr. Hale Bogus last notes on this. She states she has plenty of  Flagyl however. She comes in today complaining of pain in the right lateral foot which she said lasted for about a day. The wound on the right foot is actually much more medially. She also tells me that the [facility] cost a lot of pain in the left foot wound and she turned back to silver alginate. Finally Dermagraft has a $322 per application co-pay. She cannot afford this 07/23/17; patient arrives today with the wound not much smaller. There is not much new to add. She has not heard from [name] all try to put in a call to them today. She was asking about Dermagraft again and she has an over $025 per application co-pay she states that she would be willing to try to do a payment plan. I been tried to avoid this. We've  been using silver alginate, I'll change to [facility] 07/30/17-She is here in follow-up evaluation for left foot ulcer. She continues hyperbaric medicine. The left foot ulcer is stable we will continue with same treatment plan 08/06/17; she is here for evaluation of her left foot ulcer. Currently being treated for hyperbarics or underlying osteomyelitis. She is completed antibiotics. The left foot ulcer is better smaller with healthier looking granulation. For various reasons I am not really clear on we never got her back to the OR with [name]. He did not respond to my secure text message. Nevertheless I think that surgery on this point is not necessary nor am I completely clear that a skin substitute is necessary The patient is complaining about pain on the outside of her right foot. 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 There is no erythema. She also states the foot is warm versus her other part of her upper leg and this is largely true. It is not totally clear to me what's causing this. She thinks it's different from her usual neuropathy pain 08/13/17; she arrives in clinic today with a small wound which is superficial on her right first metatarsal head.  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 She tells [place] she was up on her feet over the Mother's Day celebration. The large wound is on the left foot. Continues with hyperbarics for underlying osteomyelitis. We're using Hydrofera Blue. She asked me today about where we were with Dermagraft. I had actually excluded this because of the co- pay however she wants to assume this therefore I'll recheck the co-pay an order for next week. 08/20/17; the patient agreed to accept the co-pay of the first Apligraf which we applied today. She is disappointed she is finishing hyperbarics will run this through the insurance on the extent of the foot infection and the extent of the wound that she had however she is already had 60 dive's. Dermagraft No. 1 08/27/17; Dermagraft No. 2. She is not eligible for any more hyperbaric treatments this month. She reports a fair amount of drainage and she actually changed to the external dressings without disturbing the direct contact layer 09/03/17; the patient arrived in clinic today with the wound superficially looking quite healthy. Nice vibrant red tissue with some advancing epithelialization although not as much adherence of the flap as I might like. However she noted on her own fourth toe some bogginess and she brought that to our attention. Indeed this was boggy feeling like a possibility of subcutaneous fluid. 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 She is not been unwell. We've been using Dermagraft 09/10/17; the culture that I did not last week was MRSA. She saw Dr. Megan Salon this morning who is going to start her on vancomycin. I had sent him a secure a text message yesterday. I also spoke with her podiatric surgeon [name] about surgery on this foot the options for conserving a functional foot etc. Promised me he would see her and will make back consultation today.  Paradoxically her actual wound on the plantar aspect of her left foot looks really quite good. I had given her 5 days worth of Baxdella to cover her for MRSA. Her MRI came back showing osteomyelitis within the third metatarsal shaft and head and base of the third and fourth proximal phalanx. She had extensive inflammatory changes throughout the soft tissue of the lateral  forefoot. With an ill-defined fluid around the fourth metatarsal extending into the plantar and dorsal soft tissues 09/19/17; the patient is actually on oral Septra and Flagyl. She apparently refused IV vancomycin. She also saw [name] at my request who is planning her for a left BKA sometime in mid July. MRI showed osteomyelitis within the third metatarsal shaft and head and the basis of the third and fourth proximal phalanx. I believe there was felt to be possible septic arthritis involving the third MTP. 09/26/17; the patient went back to Dr. Megan Salon at my suggestion and is now receiving IV daptomycin. Her wound continues to look quite good making the decision to proceed with a transmetatarsal amputation although more difficult for the patient. I believe in my extensive discussions with her she has a good sense of the pros and cons of this. I don't NV the tuft decision she has to make. She has an appointment with [name] I believe in mid July and I previously spoken to him about this issue Has we had used 3 previous Dermagraft. Given the condition of the wound surface I went ahead and added the fourth one today area and I did this not fully realizing that she'll be traveling to [place] next week. I'm hopeful she can come back in 2 weeks 10/21/17; Her same Dermagraft on for about 3-1/2 weeks. In spite of this the wound arrives looking quite healthy. There is been a lot of healing dimensions are smaller. Looking at the square shaped wound she has now there is some undermining and some depth medially under the undermining  although I cannot palpate any bone. No surrounding infection is obvious. She has difficult questions about how to look at this going forward vis--vis amputations versus continued medical therapy. To be truthful the wound is looks so healthy and it is continued to contract. Hard to justify foot surgery at this point although I still told her that I think it might come to that if we are not able to eradicate the underlying MRSA. She is still highly at risk and she understands this 11/06/17 on evaluation today patient appears to be doing better in regard to her foot ulcer. She's been tolerating the dressing changes without complication. Currently she is here for her Dermagraft #6. Her wound continues to make excellent progress at this point. She does not appear to have any evidence of infection which is good news. 11/13/17 on evaluation today patient appears to be doing excellent at this time. She is here for repeat Dermagraft application. This is #7. Overall her wound seems to be making great progress. 12/05/17; the patient arrives with the wound in much better condition than when I last saw this almost 6 weeks ago. She still has a small probing area in the left metatarsal head region on the lateral aspect of her foot. We applied her last Dermagraft today. Since the last time she is here she has what appears to a been a blood blister on the plantar aspect of left foot although I don't see this is threatening. There is also a thick raised tissue on the right mid metatarsal head region. This was not there I don't think the last time she was here 3 weeks ago. 12/12/17; the patient continues to have a small programming area in the left metatarsal head region on the lateral aspect of her foot which was the initial large surgical wound. I applied her last Apligraf last week. I'm going to use Endoform starting today Unfortunately  she has an excoriated area in the left mid foot and the right mid foot. The left  midfoot looks like a blistered area this was not opened last week it certainly is open today. Using silver alginate on these areas. She promises me she is offloading this. 12/19/17; the small probing area in the left metatarsal head eyes think is shallower. In general her original wound looks better. We've been using Endoform. The area inferiorly that I think was trauma last week still requires debridement a lot of nonviable surface which I removed. She still has an open open area distally in her foot Similarly on the right foot there is tightly adherent surface debris which I removed. Still areas that don't look completely epithelialized. This is a small open area. We used silver alginate on these areas 12/26/2017; the patient did not have the supplies we ordered from last week including the Endoform. The original large wound on the left lateral foot looks healthy. She still has the undermining area that is largely unchanged from last week. She has the same heavily callused raised edged wounds on the right mid and left midfoot. Both of these requiring debridement. We have been using silver alginate on these areas 01/02/2018; there is still supply issues. We are going to try to use Prisma but I am not sure she actually got it from what she is saying. She has a new open area on the lateral aspect of the left fourth toe [previous fifth ray amputation]. Still the one tunneling area over the fourth metatarsal head. The area is in the midfoot bilaterally still have thick callus around them. She is concerned about a raised swelling on the lateral aspect of the foot. However she is completely insensate 01/10/2018; we are using Prisma to the wounds on her bilateral feet. Surprisingly the tunneling area over the left fourth metatarsal head that was part of her original surgery has closed down. She has a small open area remaining on the incision line. 2 open areas in the midfoot. 02/10/2018; the patient  arrives back in clinic after a month hiatus. She was traveling to visit family in [place]. Is fairly clear she was not offloading the areas on her feet. The original wound over the left lateral foot at the level of metatarsal heads is reopened and probes medially by about a centimeter or 2. She notes that a week ago she had purulent drainage come out of an area on the left midfoot. Paradoxically the worst area is actually on the right foot is extensive with purulent drainage. We will use silver alginate today 02/17/2018; the patient has 3 wounds one over the left lateral foot. She still has a small area over the metatarsal heads which is the remnant of her original surgical wound. This has medial probing depth of roughly 1.4 cm somewhat better than last week. The area on the right foot is larger. We have been using silver alginate to all areas. The area on the right foot and left foot that we cultured last week showed both Klebsiella and Proteus. Both of these are quinolone sensitive. The patient put her's self on Bactrim and Flagyl that she had left hanging around from prior antibiotic usages. She was apparently on this last week when she arrived. I did not realize this. Unfortunately the Bactrim will not cover either 1 of these organisms. We will send in Cipro 500 twice daily for a week 03/04/2018; the patient has 2 wounds on the left foot one is the  original wound which was a surgical wound for a deep DFU. At one point this had exposed bone. She still has an area over the fourth metatarsal head that probes about 1.4 cm although I think this is better than last week. I been using silver nitrate to try and promote tissue adherence and been using silver alginate here. She also has an area in the left midfoot. This has some depth but a small linear wound. Still requiring debridement. On the right midfoot is a circular wound. A lot of thick callus around this area. We have been using silver alginate  to all wound areas She is completed the ciprofloxacin I gave her 2 weeks ago. 03/11/2018; the patient continues to have 2 open areas on the left foot 1 of which was the original surgical wound for a deep DFU. Only a small probing area remains although this is not much different from last week we have been using silver alginate. The other area is on the midfoot this is smaller linear but still with some depth. We have been using silver alginate here as well On the right foot she has a small circular wound in the mid aspect. This is not much smaller than last time. We have been using silver alginate here as well 03/18/2018; she has 3 wounds on the left foot the original surgical wound, a very superficial wound in the mid aspect and then finally the area in the mid plantar foot. She arrives in today with a very concerning area in the wound in the mid plantar foot which is her most proximal wound. There is undermining here of roughly 1-1/2 cm superiorly. Serosanguineous drainage. She tells me she had some pain on for over the weekend that shot up her foot into her thigh and she tells me that she had a nodule in the groin area. She has the single wound in the right foot. We are using endoform to both wound areas 03/24/2018; the patient arrives with the original surgical wound in the area on the left midfoot about the same as last week. There is a collection of fluid under the surface of the skin extending from the surgical wound towards the midfoot although it does not reach the midfoot wound. The area on the right foot is about the same. Cultures from last week of the left midfoot wound showed abundant Klebsiella abundant Enterococcus faecalis and moderate methicillin resistant staph I gave her Levaquin but this would have only covered the Klebsiella. She will need linezolid 04/01/2018; she is taking linezolid but for the first few days only took 1 a day. I have advised her to finish this at twice  daily dosing. In any case all of her wounds are a lot better especially on the left foot. The original surgical wound is closed. The area on the left midfoot considerably smaller. The area on the right foot also smaller. 04/08/2018; her original surgical wound/osteomyelitis on the left foot remains closed. She has area on the left foot that is in the midfoot area but she had some streaking towards this. This is not connected with her original wound at least not visually. Small wound on the right midfoot appears somewhat smaller. 04/15/18; both wounds looks better. Original wound is better left midfoot. Using silver alginate 1/21; patient states she uses saltwater soak in, stones or remove callus from around her wounds. She is also concerned about a blood blister she had on the left foot but it simply resolved on its own.  We've been using silver alginate 1/28; the patient arrives today with the same streaking area from her metatarsals laterally [the site of her original surgical wound] down to the middle of her foot. There is some drainage in the subcutaneous area here. This concerns me that there is actually continued ongoing infection in the metatarsals probably the fourth and third. This fixates an MRI of the foot without contrast [chronic renal failure] The wound in the mid part of the foot is small but I wonder whether this area actually connects with the more distal foot. The area on the right midfoot is probably about the same. Callus thick skin around the small wound which I removed with a curette we have been using silver alginate on both wound areas 2/4; culture I did of the draining site on the left foot last time grew methicillin sensitive staph aureus. MRI of the left foot showed interval resolution of the findings surrounding the third metatarsal joint on the prior study consistent with treated osteomyelitis. Chronic soft tissue ulceration in the plantar and lateral aspect of the  forefoot without residual focal fluid collection. No evidence of recurrent osteomyelitis. 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 She tells me she had nausea and vomiting over the weekend and missed 2 days. In general her foot looks much the same. She has a small open area just below the left fourth metatarsal head. A linear area in the left midfoot. Some discoloration extending from the inferior part of this into the left lateral foot although this appears to be superficial. She has a small area on the right midfoot which generally looks smaller after debridement 2/18; the patient is completing his cephalexin and has another 2 days. She continues to have open areas on the left and right foot. 2/25; she is now off antibiotics. The area on the left foot at the site of her original surgical wound has closed yet again. She still has open areas in the mid part of her foot however these appear smaller. The area on the right mid foot looks about the same. We have been using silver alginate She tells me she had a serious hypoglycemic spell at home. She had to have EMS called and get IV dextrose 3/3; disappointing on the left lateral foot large area of necrotic tissue surrounding the linear area. This appears to track up towards the same original surgical wound. Required extensive debridement. The area on the right plantar foot is not a lot better also using silver 3/12; the culture I did last time showed abundant enterococcus. I have prescribed Augmentin, should cover any unrecognized anaerobes as well. In addition there were a few MRSA and Serratia that would not be well covered although I did not want to give her multiple antibiotics. She comes in today with  a new wound in the right midfoot this is not connected with the original wound over her MTP a lot of thick callus tissue around both wounds but once again she said she is not walking on these areas 3/17-Patient comes in for follow-up on the bilateral plantar wounds, the right midfoot and the left plantar wound. Both these are heavily callused surrounding the wounds. We are continuing to use silver alginate, she is compliant with offloading  and states she uses a wheelchair fairly often at home 3/24; both wound areas have thick callus. However things actually look quite a bit better here for the majority of her left foot and the right foot. 3/31; patient continues to have thick callused somewhat irritated looking tissue around the wounds which individually are fairly superficial. There is no evidence of surrounding infection. We have been using silver alginate however I change that to [facility] today 4/17; patient returns to clinic after having a scare with Covid she tested negative in her primary doctor's office. She has been using Hydrofera Blue. She does not have an open area on the right foot. On the left foot she has a small open area with the mid area not completely viable. She showed me pictures of what looks like a hemorrhagic blister from several days ago but that seems to have healed over this was on the lateral left foot 4/21; patient comes in to clinic with both her wounds on her feet closed. However over the weekend she started having pain in her right foot and leg up into the thigh. She felt as though she was running a low-grade fever but did not take her temperature. She took a doxycycline that she had leftover and yesterday a single Septra and metronidazole. She thinks things feel somewhat better. 4/28; duplex ultrasound I ordered last week was negative for DVT or superficial thrombophlebitis. She is completed the doxycycline I gave her. States she is still having a lot of pain  in the right calf and right ankle which is no better than last week. She cannot sleep. She also states she has a temperature of up to 101, coughing and complaining of visual loss in her bilateral eyes. Apparently she was tested for Covid 2 weeks ago at [facility] and that was negative. Readmission: 09/03/18 patient presents back for reevaluation after having been evaluated at the end of April regarding erythema and swelling of her right lower extremity. Subsequently she ended up going to the hospital on 07/29/18 and was admitted not to be discharged until 08/08/18. Unfortunately it was noted during the time that she was in the hospital that she did have methicillin-resistant Staphylococcus aureus as the infection noted at the site. It was also determined that she did have osteomyelitis which appears to be fairly significant. She was treated with vancomycin and in fact is still on IV vancomycin at dialysis currently. This is actually slated to continue until 09/12/18 at least which will be the completion of the six weeks of therapy. Nonetheless based on what I'm seeing at this point I'm not sure she will be anywhere near ready to discontinue antibiotics at that time. Since she was released from the hospital she was seen by [name] who is her podiatrist on 08/27/18. His note specifically states that he is recommended that the patient needs of one knee amputation on the right as she has a life-threatening situation that can lead quickly to sepsis. The patient advised she would like to try to save her leg to which [name] apparently told her that this was against all medical advice. She also want to discontinue the Wound VAC which had been initiated due to the fact that she wasn't pleased with how the wound was looking and subsequently she wanted to pursue applying Medihoney at that time. He stated that he did not believe that the right lower extremity was salvageable and that the patient understood but  would still like to attempt hyperbaric  option therapy if it could be of any benefit. She was therefore referred back to [place] for further evaluation. He plans to see her back next week. Upon inspection today patient has a significant amount purulent drainage noted from the wound at this point. The bone in the distal portion of her foot also appears to be extremely necrotic and spongy. When I push down on the bone it bubbles and seeps purulent drainage from deeper in the end of the foot. I do not think that this is likely going to heal very well at all and less aggressive surgical debridement were undertaken more than what I believe we can likely do here in our office. 09/12/2018; I have not seen this patient since the most recent hospitalization although she was in our clinic last week. I have reviewed some of her records from a complex hospitalization. 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 There is situation was complicated by MRSA bacteremia and acute on chronic renal failure now on dialysis. She is receiving vancomycin at dialysis. We started her on Dakin's wet-to- dry last week she is changing this daily. There is still purulent drainage coming out of her foot. Although she is apparently "agreeable" to a below-knee amputation which is been suggested by multiple clinicians she wants this to be done in [place]. She apparently has a telehealth visit with that provider sometime in late [place] 6/24. I have told her I think this is probably too long. Nevertheless I could not convince her to allow a local doctor to perform BKA. 09/19/2018; the patient has a large necrotic area on the right anterior foot. She has had previous transmetatarsal amputations. Culture I did last week showed MRSA nothing else she is on vancomycin at dialysis. She has continued leaking purulent drainage out of the distal part of the large circular wound on the right anterior  foot. She apparently went to see Dr. 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 Somehow that translated into her being referred to plastic surgery for debridement of the area. I gather she basically refused amputation although I do not have a copy of Dr. Berenice Primas notes. The patient really wants to have a trial of hyperbaric oxygen. I agreed with initial assessment in this clinic that this was probably too far along to benefit however if she is going to have plastic surgery I think she would benefit from ancillary hyperbaric oxygen. The issue here is that the patient has benefited as maximally as any patient I have ever seen from hyperbaric oxygen therapy. Most recently she had exposed bone on the lateral part of her left foot after a surgical procedure and that actually has closed. She has eschared areas in both heels but no open area. She is remained systemically well. I am not optimistic that anything can be done about this but the patient is very clear that she wants an attempt. The attempt would include a wound VAC further debridements and hyperbaric oxygen along with IV antibiotics. 6/26; I put her in for a trial of hyperbaric oxygen only because of the dramatic response she has had with wounds on her left midfoot earlier this year which was a surgical wound that went straight to her bone over the metatarsal heads and also remotely the left third toe. We will see if we can get this through our review process and insurance. She arrives in clinic with again purulent material pouring out of necrotic bone  on the top of the foot distally. There is also some concerning erythema on the front of the leg that we marked. It is bit difficult to tell how tender this is because of neuropathy. I note from infectious disease that she had her vancomycin extended. 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 She had the wound VAC on for part of the week.  The rest of the time she is putting various things on this including Medihoney, "ionized water" silver sorb gel etc. 7/7; follow-up along with HBO. She is still on vancomycin at dialysis. She has a large open area on the dorsal right foot and a small dark eschar area on her heel. There is a lot less erythema in the area and a lot less tenderness. From an infection point of view I think this is better. She still has a lot of necrosis in the remaining right forefoot [previous TMA] we are still using the wound VAC in this area 7/16; follow-up along with HBO. I put her on linezolid after she finished her vancomycin. We started this last Friday I gave her 2 weeks worth. I had the expectation that she would be operatively debrided by Dr. Marla Roe but that still has not happened yet. Patient phoned the office this week. She arrives for review today after HBO. The distal part of this wound is completely necrotic. Nonviable pieces of tendon bone was still purulent drainage. Also concerning that she has black eschar over the heel that is expanding. I think this may be indicative of infection in this area as well. She has less erythema and warmth in the ankle and calf but still an abnormal exam 7/21 follow-up along with HBO. I will renew her linezolid after checking a CBC with differential monitoring her blood counts especially her platelets. She was supposed to have surgery yesterday but if I am reading things correctly this was canceled after her blood sugar was found to be over 500. I thought Dr. Marla Roe who called me said that they were sending her to the ER but the patient states that was not the case. 7/28. Follow-up along with HBO. She is on linezolid I still do not have any lab work from dialysis even though I called last week. The patient is concerned about an area on her left lateral foot about the level of the base of her fifth metatarsal. I did not really see anything that ominous here  however this patient is in [place] ability to point out problems that she is sensing and she has been accurate in the past Finally she received a call from Dr. Marla Roe who is referring her to another orthopedic surgeon stating that she is too booked up to take her to the operating room now. Was still using a wound VAC on the foot 8/3 -Follow-up after HBO, she is got another week of linezolid, she is to call ID for an appointment, x-rays of both feet were reviewed, the left foot x-ray with third MTP joint osteo- Right foot x-ray widespread osteo-in the right midfoot Right ankle x-ray does not show any active evidence of infection 8/11-Patient is seen after HBO, the wounds on the right foot appear to be about the same, the heel wound had some necrotic base over tendon that was debrided with a curette 8/21; patient is seen after HBO. The patient's wound on her dorsal foot actually looks reasonably good and there is substantial amount of epithelialization however the open area distally still  has a lot of necrotic debris partially bone. I cannot really get a good sense of just how deep this probes under the foot. She has been pressuring me this week to order medical maggots through a company in [place] for her. The problem I have is there is not a defined wound area here. On the positive side there is no purulence. She has been to see infectious disease she is still on Septra DS although I have not had a chance to review their notes 8/28; patient is seen in conjunction with HBO. The wounds on her foot continued to improve including the right dorsal foot substantially the, the distal part of this wound and the area on the right heel. We have been using a wound VAC over this chronically. She is still on trimethoprim as directed by infectious disease 9/4; patient is seen in conjunction with HBO. Right dorsal foot wound substantially anteriorly is better however she continues to have a deep wound in  the distal part of this that is not responding. We have been using silver collagen under border foam Area on the right plantar medial heel seems better. We have been using Hydrofera Blue 12/12/18 on evaluation today patient appears to be doing about the same with regard to her wound based on prior measurements. She does have some necrotic tissue noted on the lateral aspect of the wound that is going require a little bit of sharp debridement today. This includes what appears to be potentially either severely necrotic bone or tendon. Nonetheless other than that she does not appear to have any severe infection which is good news 9/18; it is been 2 weeks since I saw this wound. She is tolerating HBO well. Continued dramatic improvement in the area on the right dorsal foot. She still has a small wound on the heel that we have been using Hydrofera Blue. She continues with a wound VAC 9/24; patient has to be seen emergently today with a swelling on her right lateral lower leg. She says that she told Dr. Evette Doffing about this and also myself on a couple of occasions but I really have no recollection of this. She is not systemically unwell and her wound really looked good the last time I saw this. She showed this to providers at dialysis and she was able to verify that she was started on cephalexin today for 5 doses at dialysis. She dialyzes on Tuesday Thursday and Saturday. 10/2; patient is seen in conjunction with HBO. The area that is draining on the right anterior medial tibia is more extensive. Copious amounts of serosanguineous drainage with some purulence. We are still using the wound VAC on the original wound then it is stable. Culture I did of the original IandD showed MRSA I contacted dialysis she is now on vancomycin with dialysis treatments. I asked them to run a month 10/9; patient seen in conjunction with HBO. She had a new spontaneous open area just above the wound on the right medial tibia  ankle. More swelling on the right medial tibia. Her wound on the foot looks about the same perhaps slightly better. There is no warmth spreading up her leg but no obvious erythema. her MRI of the foot and ankle and distal tib-fib is not booked for next Friday I discussed this with her in great detail over multiple days. it is likely she has spreading infection upper leg at least involving the distal 25% above the ankle. She knows that if I refer her to orthopedics  for infectious disease they are going to recommend amputation and indeed I am not against this myself. We had a good trial at trying to heal the foot which is what she wanted along with antibiotics debridement and HBO however she clearly has spreading infection [probably staph aureus/MRSA]. Nevertheless she once again tells me she wants to wait the left of the MRI. She still makes comments about having her amputation done in [place]. 10/19; arrives today with significant swelling on the lateral right leg. Last culture I did showed Klebsiella. Multidrug- resistant. Cipro was intermediate sensitivity and that is what I have her on pending her MRI which apparently is going to be done on Thursday this week although this seems to be moving back and forth. She is not systemically unwell. We are using silver alginate on her major wound area on the right medial foot and the draining areas on the right lateral lower leg 10/26; MRI showed extensive abscess in the anterior compartment of the right leg also widespread osteomyelitis involving osseous structures of the midfoot and portions of the hindfoot. Also suspicion for osteomyelitis anterior aspect of the distal medial malleolus. Culture I did of the purulence once again showed a multidrug-resistant Klebsiella. I have been in contact with nephrology late last week and she has been started on cefepime at dialysis to replace the vancomycin We sent a copy of her MRI report to Dr. Geroge Baseman in  [place] who is an orthopedic surgeon. The patient takes great stock in his opinion on this. She says she will go to [place] to have her leg amputated if Dr. Geroge Baseman does not feel there is any salvage options. 11/2; she still is not talk to her orthopedic surgeon in [place]. Apparently he will call her at [phone] this afternoon. The quality of this is she has not allowed me to refer her anywhere. She has been told over and over that she needs this amputated but has not agreed to be referred. She tells me her blood sugar was 600 last night but she has not been febrile. 11/9; she never did got a call from the orthopedic surgeon in [place] therefore that is off the radar. We have arranged to get her see orthopedic surgery at [facility]. She still has a lot of draining purulence coming out of the new abscess in her right leg although that probably came from the osteomyelitis in her right foot and heel. Meanwhile the original wound on the right foot looks very healthy. Continued improvement. The issue is that the last MRI showed osteomyelitis in her right foot extensively she now has an abscess in the right anterior lower leg. There is nobody in [place] who will offer this woman anything but an amputation and to be honest that is probably what she needs. I think she still wants to talk about limb salvage although at this point I just do not see that. She has completed her vancomycin at dialysis which was for the original staph aureus she is still on cefepime for the more recent Klebsiella. She has had a long course of both of these antibiotics which should have benefited the osteomyelitis on the right foot as well as the abscess. 11/16; apparently [place] elective surgery is shut down because of COVID-19 pandemic. I have reached out to some contacts at [facility] to see if we can get her an orthopedic appointment there. I am concerned about continually leaving this but for the moment  everything is static. In fact her original large wound on  this foot is closing down. It is the abscess on the right anterior leg that continues to drain purulent serosanguineous material. She is not currently on any antibiotics however she had a prolonged course of vancomycin [1 month] as well as cefepime for a month 02/24/2019 on evaluation today patient appears to be doing better than the last time I saw her. This is not a patient that I typically see. With that being said I am covering for Dr. Dellia Nims this week and again compared to when I last saw her overall the wounds in particular seem to be doing significantly better which is good news. With that being said the patient tells me several disconcerting things. She has not been able to get in to see anyone for potential debridement in regard to her leg wounds although she tells me that she does not think it is necessary any longer because she is taking care of that herself. She noticed a string coming out of the lower wound on her leg over the last week. The patient states that she subsequently decided that we must of pack something in there and started pulling the string out and as it kept coming and coming she realized this was likely her tendon. With that being said she continued to remove as much of this as she could. She then I subsequently proceeded to using tubes of antibiotic ointment which she will stick down into the wound and then scored as much as she can until she sees it coming out of the other wound opening. She states that in doing this she is actually made things better and there is less redness and irritation. With regard to her foot wound she does have some necrotic tendon and tissue noted in one small corner but again the actual wound itself seems to be doing better with good granulation in general compared to my last evaluation. 12/7; continued improvement in the wound on the substantial part of the right medial foot. Still  a necrotic area inferiorly that required debridement but the rest of this looks very healthy and is contracting. She has 2 wounds on the right lateral leg which were her original drainage sites from her abscess but all of this looks a lot better as well. She has been using silver alginate after putting antibiotic biotic ointment in one wound and watching it come out the other. I have talked to her in some detail today. I had given her names of orthopedic surgeons at [facility] for second opinion on what to do about the right leg. I do not think the patient never called them. She has not been able to get a hold of the orthopedic surgeon in [place] that she had put a lot of faith in as being somebody would give her an opinion that she would trust. I talked to her today and said even if I could get her in to another orthopedic surgeon about the leg which she accept an amputation and she said she would not therefore I am not going to press this issue for the moment 12/14; continued improvement in his substantial wound on the right medial foot. There is still a necrotic area inferiorly with tightly adherent necrotic debris which I have been working on debriding each time she is here. She does not have an orthopedic appointment. Since last time she was here I looked over her cultures which were essentially MRSA on the foot wound and gram- negative rods in the abscess on the anterior leg.  12/21; continued improvement in the area on the right medial foot. She is not up on this much and that is probably a good thing since I do not know it could support continuous ambulation. She has a small area on the right lateral leg which were remanence of the IandD's I did because of the abscess. I think she should probably have prophylactic antibiotics I am going to have to look this over to see if we can make an intelligent decision here. In the meantime her major wound is come down nicely. Necrotic area  inferiorly is still there but looks a lot better 04/06/2019; she has had some improvement in the overall surface area on the right medial foot somewhat narrowedr both but somewhat longer. The areas on the right lateral leg which were initial IandD sites are superficial. Nothing is present on the right heel. We are using silver alginate to the wound areas 1/18; right medial foot somewhat smaller. Still a deep probing area in the most distal recess of the wound. She has nothing open on the right leg. She has a new wound on the plantar aspect of her left fourth toe which may have come from just pulling skin. The patient using Medihoney on the wound on her foot under silver alginate. I cannot discourage her from this 2/1; 2-week follow-up using silver alginate on the right foot and her left fourth toe. The area on the right dorsal foot is contracted although there is still the deep area in the most distal part of the wound but still has some probing depth. No overt infection 2/15; 2-week follow-up. She continues to have improvement in the surface area on the dorsal right foot. Even the tunneling area from last time is almost closed. The area that was on the plantar part of her left fourth toe over the PIP is indeed closed 3/1; 2-week follow-up. Continued improvement in surface area. The original divot that we have been debriding inferiorly I think has full epithelialization although the epithelialization is gone down into the wound with probably 4 mm of depth. Even under intense illumination I am unable to see anything open here. The remanence of the wound in this area actually look quite healthy. We have been using silver alginate 3/15; 2-week follow-up. Unfortunately not as good today. She has a comma shaped wound on the dorsal foot however the upper part of this is larger. Under illumination debris on the surface She also tells [place] that she was on her right leg 2 times in the last couple of weeks  mostly to reach up for things above her head etc. She felt a sharp pain in the right leg which she thinks is somewhere from the ankle to the knee. The patient has neuropathy and is really uncertain. She cannot feel her foot so she does not think it was coming from there Electronic Signature(s) Signed: 06/16/2019 10:32:20 AM By: Linton Ham MD Entered By: Linton Ham on 06/15/2019 13:31:02 -------------------------------------------------------------------------------- Physical Exam Details Patient Name: Date of Service: [name] 06/15/2019 1:00 PM Medical Record Number:[id number] Patient Account Number: [id number] Date of Birth/Sex: Treating RN: 20-Aug-1970 (49 y.o. Nancy Fetter Primary Care Provider: [name] Other Clinician: Referring Provider: Treating Provider/Extender:Gilles Trimpe, [name], [name] in Treatment: 51 Constitutional Patient is hypertensive.. Pulse regular and within target range for patient.Marland Kitchen Respirations regular, non-labored and within target range.. Temperature is normal and within the target range for the patient.Marland Kitchen Appears in no distress. Notes Wound exam; the wound did  not look too bad however it is larger especially proximally. I debrided the surface of the wound with a #5 curette to get the debris under illumination off. This looks like fibrinous necrotic debris nothing special. There was nothing that looked infected. Her right foot was not warm the ankle had no effusion. No palpable tenderness along the tibia. There was no fluid on the knee however she does have some ligamentous instability probably in the collateral ligaments but this did not seem to be the source of this. Electronic Signature(s) Signed: 06/16/2019 10:32:20 AM By: Linton Ham MD Entered By: Linton Ham on 06/15/2019 13:32:44 -------------------------------------------------------------------------------- Physician Orders Details Patient Name: Date of  Service: [name] 06/15/2019 1:00 PM Medical Record Number:[id number] Patient Account Number: [id number] Date of Birth/Sex: Treating RN: Aug 19, 1970 (49 y.o. Nancy Fetter Primary Care Provider: [name] Other Clinician: Referring Provider: Treating Provider/Extender:Rashaun Curl, [name], [name] in Treatment: 66 Verbal / Phone Orders: No Diagnosis Coding ICD-10 Coding Code Description [phone] Other chronic osteomyelitis, right ankle and foot L97.514 Non-pressure chronic ulcer of other part of right foot with necrosis of bone E10.621 Type 1 diabetes mellitus with foot ulcer L97.521 Non-pressure chronic ulcer of other part of left foot limited to breakdown of skin Follow-up Appointments Return Appointment in 1 week. Dressing Change Frequency Wound #43 Right,Medial Foot Change dressing three times week. Wound Cleansing Clean wound with Wound Cleanser - Anasept to all wounds Primary Wound Dressing Wound #43 Right,Medial Foot Calcium Alginate with Silver Other: - may use thin layer of medihoney under alginate Secondary Dressing Wound #43 Right,Medial Foot Kerlix/Rolled Gauze Dry Gauze Edema Control Elevate legs to the level of the heart or above for 30 minutes daily and/or when sitting, a frequency of: - throughout the day [facility] for wound care. - Interim Radiology X-ray, right tibia/fibula - Pain in right lower leg, questionable fracture - (ICD10 [id number] - Other chronic osteomyelitis, right ankle and foot) Electronic Signature(s) Signed: 06/15/2019 5:44:18 PM By: [name] RN, BSN Signed: 06/16/2019 10:32:20 AM By: Linton Ham MD Entered By: [name] on 06/15/2019 13:03:48 -------------------------------------------------------------------------------- Prescription 06/15/2019 Patient Name: [name] Provider: Linton Ham MD Date of Birth: 31-Oct-1970 NPI#: [id number] Sex:  F DEA#: [id number] Phone #: [phone] License #: [id number] Patient Address: [place] [address] [place], [place] [address] [place], [place] [address] [phone] Allergies Cleocin Reaction: diarrhea, itching Severity: Moderate lisinopril Reaction: hyperkalemia Severity: Moderate Provider's Orders X-ray, right tibia/fibula - ICD10: M86.671 - Pain in right lower leg, questionable fracture Signature(s): Date(s): Electronic Signature(s) Signed: 06/15/2019 5:44:18 PM By: [name] RN, BSN Signed: 06/16/2019 10:32:20 AM By: Linton Ham MD Entered By: [name] on 06/15/2019 13:03:49 --------------------------------------------------------------------------------  Problem List Details Patient Name: Date of Service: [name] 06/15/2019 1:00 PM Medical Record Number:[id number] Patient Account Number: [id number] Date of Birth/Sex: Treating RN: 1971/02/16 (49 y.o. Nancy Fetter Primary Care Provider: [name] Other Clinician: Referring Provider: Treating Provider/Extender:Nikesha Kwasny, [name], [name] in Treatment: 61 Active Problems ICD-10 Evaluated Encounter Code Description Active Date Today Diagnosis M86.671 Other chronic osteomyelitis, right ankle and foot 09/03/2018 No Yes L97.514 Non-pressure chronic ulcer of other part of right foot 09/03/2018 No Yes with necrosis of bone E10.621 Type 1 diabetes mellitus with foot ulcer 09/24/2018 No Yes Inactive Problems ICD-10 Code Description Active Date Inactive Date L97.812 Non-pressure chronic ulcer of other part of right lower  leg with 02/24/2019 02/24/2019 fat layer exposed L97.521 Non-pressure chronic ulcer of other part of left foot limited to 04/20/2019 04/20/2019 breakdown of skin Resolved Problems ICD-10 Code Description Active Date Resolved Date L02.415 Cutaneous abscess of right lower limb 12/25/2018  12/25/2018 Electronic Signature(s) Signed: 06/16/2019 10:32:20 AM By: Linton Ham MD Entered By: Linton Ham on 06/15/2019 13:29:12 -------------------------------------------------------------------------------- Progress Note Details Patient Name: Date of Service: [name] 06/15/2019 1:00 PM Medical Record Number:[id number] Patient Account Number: [id number] Date of Birth/Sex: Treating RN: Jan 22, 1971 (49 y.o. Nancy Fetter Primary Care Provider: [name] Other Clinician: Referring Provider: Treating Provider/Extender:Vedanshi Massaro, [name], [name] in Treatment: 40 Subjective History of Present Illness (HPI) 49 year old diabetic who is known to have type 1 diabetes which is poorly controlled last hemoglobin A1c was 11%. She comes in with a ulcerated area on the left lateral foot which has been there for over 6 months. Was recently she has been treated by [name] of podiatry who saw her last on 05/28/2016. Review of his notes revealed that the patient had incision and drainage with placement of antibiotic beads to the left foot on 04/11/2016 for possible osteomyelitis of the cuboid bone. Over the last year she's had a history of amputation of the left fifth toe and a femoropopliteal popliteal bypass graft somewhere in April 2017. 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 His note [name] mentions that the patient has been referred to me for further wound care and possibly great candidate for hyperbaric oxygen therapy due to recurrent osteomyelitis. However we do not have any x-rays of biopsy reports confirming this. He has been on several antibiotics including Bactrim and most recently is on doxycycline for an MRSA. I understand, the patient was not a candidate for IV antibiotics as she has had previous PICC lines which resulted in blood clots in both arms. There was a x-ray report dated 04/04/2016 on [name] notes which showed  evidence of fifth ray resection left foot with osteolytic changes noted to the fourth metatarsal and cuboid bone on the left. 06/13/2016 -- had a left foot x-ray which showed no acute fracture or dislocation and no definite radiographic evidence of osteomyelitis. Advanced osteopenia was seen. 06/20/2016 -- she has noticed a new wound on the right plantar foot in the region where she had a callus before. 06/27/16- the patient did have her x-ray of the right foot which showed no findings to suggest osteomyelitis. She saw her endocrinologist, [name], yesterday. Her A1c in January was 11. He also indicates mismanagement and noncompliance regarding her diabetes. She is currently on Bactrim for a lip infection. She is complaining of nausea, vomiting and diarrhea. She is unable to articulate the exact orders or dosing of the Bactrim; it is unclear when she will complete this. 07/04/2016 -- results from [facility] of ABIs with ankle waveforms were noted from 02/14/2016. The examination done on 06/27/2015 showed noncompressible ABIs with the right being 1.45 and the left being 1.33. The present examination showed a right ABI of 1.19 on the left of 1.33. The conclusion was that right normal ABI in the lower extremity at rest however compared to previous study which was noncompressible ABI may be falsely elevated side suggesting medial calcification. The left ABI suggested medial calcification. 08/01/2016 -- the patient had more redness and pain on her right foot and did not get to come to see as noted she see her PCP or go to the ER and decided to take some  leftover metronidazole which she had at home. As usual, the patient does report she feels and is rather noncompliant. 08/08/2016 -- -- x-ray of the right foot -- FINDINGS:Transmetatarsal amputation is noted. No bony destruction is noted to suggest osteomyelitis. IMPRESSION: No evidence of osteomyelitis. Postsurgical changes are seen. MRI would be  more sensitive for possible bony changes. Culture has grown Serratia Marcescens -- sensitive to Bactrim, ciprofloxacin, ceftazidime she was seen by [name] on 08/06/2016. He did not find any exposed bone, muscle, tendon, ligament or joint. There was no malodor and he did a excisional debridement in the office. ============ Old notes: 49 year old patient who is known to the wound clinic for a while had been away from the wound clinic since 09/01/2014. Over the last several months she has been admitted to various hospitals including [facility]. She was treated for a right metatarsal osteomyelitis with a transmetatarsal amputation and this was done about 2 months ago. He has a small ulcerated area on the right heel and she continues to have an ulcerated area on the left plantar aspect of the foot. The patient was recently admitted to the [facility] hospital group between 7/12 and 10/18/2014. she was given 3 weeks of IV vancomycin and was to follow-up with her surgeons at [facility] and also took oral vancomycin for C. difficile colitis. 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 MRI of the left foot done on 10/14/2014 showed no abscess or osteomyelitis. 04/27/15; this is a patient we know from previous stays in the wound care center. She is a type I diabetic I am not sure of her control currently. Since the last time I saw her she is had a right transmetatarsal amputation and has no wounds on her right foot and has no open wounds. She is been followed at the wound care center at [facility] in [place]. She comes today with the desire to undergo hyperbaric treatment locally. Apparently one of her  wound care providers in [place] has suggested hyperbarics. This is in response to an MRI from 04/18/15 that showed increased marrow signal and loss of the proximal fifth metatarsal cortex evidence of osteomyelitis with likely early osteomyelitis in the cuboid bone as well. She has a large wound over the base of the fifth metatarsal. She also has a eschar over her the tips of her toes on 1,3 and 5. She does not have peripheral pulses and apparently is going for an angiogram tomorrow which seems reasonable. After this she is going to infectious disease at [facility]. They have been using Medihoney to the large wound on the lateral aspect of the left foot to. The patient has known Charcot deformity from diabetic neuropathy. She also has known diabetic PAD. Surprisingly I can't see that she has had any recent antibiotics, the patient states the last antibiotic she had was at the end of November for 10 days. I think this was in response to culture that showed group G strep although I'm not exactly sure where the culture was from. She is also had arterial studies on 03/29/15. This showed a right ABI of 1.4 that was noncompressible. Her left ABI was 0.73. 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 It was not felt that arterial inflow was adequate for  healing of a foot ulcer. Her Doppler waveforms looked monophasic ===== READMISSION 02/28/17; this is in an now 49 year old woman we've had at several different occasions in this clinic. 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 She has a remote ex-smoker. She was last seen in this clinic by Dr. Con Memos I think in May. More recently she is been followed by her podiatrist [name] an infectious disease Dr. Megan Salon. She has 2 open wounds the major one is over the right first metatarsal head she also has a wound on the left plantar foot. an MRI of the right foot on 01/01/17 showed a soft  tissue ulcer along the plantar aspect of the first metatarsal base consistent with osteomyelitis of the first metatarsal stump. Dr. 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 According to the patient this is been open for slightly over a month. She has been on a combination of Cipro 500 twice a day, Zyvox 600 twice a day and Flagyl 500 3 times a day for over a month now as directed by Dr. Megan Salon. cultures of the right foot earlier this year showed MRSA in January and Serratia in May. January also had a few viridans strep. Recent x-rays of both feet were done and [name] office and I don't have these reports. The patient has known PAD and has a history of aleft femoropopliteal bypass in April 2017. 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 Dwyane Dee. In spite of this the last hemoglobin A1c I can see is 10.1 on 01/01/2017. She is being referred by 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 She is been using Medihoney to this area and also an area on the left midfoot. She is using healing sandals bilaterally. ABIs in this clinic at the left posterior tibial was 1.1 noncompressible on the right READMISSION Non invasive vascular NOVANT 5/18 Aftercare following surgery of the circulatory system Procedure Note - Interface, External Ris In - 08/13/2016 11:05 AM EDT Procedure: Examination consists of physiologic resting arterial pressures of the brachial and ankle arteries bilaterally with continuous wave Doppler waveform analysis. Previous: Previous exam performed on 02/14/16 demonstrated ABIs of Rt = 1.19 and Lt = 1.33. Right: ABI = non-compressible PT, 1.47 DP. S/P transmet  amputation. Left: ABI = 1.52, 2nd digit pressure = 87 mmHg Conclusions: Right: ABI (>1.3) may be falsely elevated, suggesting medial calcification. Left: ABI (>1.3) may be falsely elevated, suggesting medial calcification The patient is a now 49 year old type I diabetic is had multiple issues her graded to chronic diabetic foot ulcers. 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 We had her in the clinic lastin November. At that point she had wounds on her bilateral feet.she had wanted to try hyperbarics however the healogics review process denied her because she hadn't followed up with her vascular surgeon for her left femoropopliteal bypass. The bypass was done by Dr. Raul Del at [facility]. We made her a follow-up with Dr. Raul Del however she did not keep the appointment and therefore she was not approved The patient shows me a small wound on her left fourth metatarsal head on her phone. She  developed rapid discoloration in the plantar aspect of the left foot and she was admitted to hospital from 2/2 through 05/10/17 with wet gangrene of the left foot osteomyelitis of the fourth metatarsal heads. She was admitted acutely ill with a temperature of 103. She was started on broad-spectrum vancomycin and cefepime. On 05/06/17 she was taken to the OR by [name] her podiatric surgeon for an incision and drainage irrigation of the left foot wound. Cultures from this surgery revealed group be strep and anaerobes. she was seen by Dr.Xu of orthopedic surgery and scheduled for a below-knee amputation which she u refused. Ultimately she was discharged on Levaquin and Flagyl for one month. MRI 05/05/17 done while she was in the hospital showed abscess adjacent to the fourth metatarsal head and neck small abscess around the fourth flexor tendon. Inflammatory phlegmon and gas in the soft tissues along the lateral aspect of the  fourth phalanx. Findings worrisome for osteomyelitis involving the fourth proximal and middle phalanx and also the third and fourth metatarsals. Finally the patient had actually shortly before this followed up with Dr. Raul Del at no time on 04/29/17. He felt that her left femoropopliteal bypass was patent he felt that her left-sided toe pressures more than adequate for healing a wound on the left foot. This was before her acute presentation. 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 05/28/17; she is started hyperbarics. The patient tells me that for some reason she was not actually on Levaquin but I think on ciprofloxacin. She was on Flagyl. She only started her Levaquin yesterday due to some difficulty with the pharmacy and perhaps her sister picking it up. She has an appointment with [name] tomorrow and with infectious disease early next week. She has no new complaints 06/06/17; the patient continues in hyperbarics. She saw [name] on 05/29/17 who is her podiatric surgeon. He is elected for a transmetatarsal amputation on 06/27/17. I'm not sure at what level he plans to do this amputation. The patient is unaware ooShe also saw Dr. Megan Salon of infectious disease who elected to continue her on current antibiotics I think this is ciprofloxacin and Flagyl. I'll need to clarify with her tomorrow if she actually has this. We're using silver alginate to the actual wound. Necrotic surface today with material under the flap of her foot. ooOriginal MRI showed abscesses as well as osteomyelitis of the proximal and middle fourth phalanx and the third and fourth metatarsal heads 06/11/17; patient continues in hyperbarics and continues on oral antibiotics. She is doing well. The wound looks better. The necrotic part of this under the flap in her superior foot also looks better. she is been to see [name]. I haven't had a chance to look at his note. Apparently he has put the transmetatarsal amputation on  hold her request it is still planning to take her to the OR for debridement and product application ACEL. I'll see if I can find his note. I'll therefore leave product ordering/requests to [name] for now. I was going to look at Dermagraft 06/18/17-she is here in follow-up evaluation for bilateral foot wounds. She continues with hyperbaric therapy. She states she has been applying manuka honey to the right plantar foot and alternate manuka honey and silver alginate to the left foot, despite our orders. We will continue with same treatment plan and she will follo up next week. 06/25/17; I have reviewed [name] last note from 3/11. She has operative debridement in 2 days' time. By review his  note apparently they're going to place there is skin over the majority of this wound which is a good choice. She has a small satellite area at the most proximal part of this wound on the left plantar foot. The area on the right plantar foot we've been using silver alginate and it is close to healing. 07/02/17; unfortunately the patient was not easily approved for [name] proposed surgery. I'm not completely certain what the issue is. She has been using silver alginate to the wound she has completed a first course of hyperbarics. She is still on Levaquin and Flagyl. I have really lost track of the time course here.I suspect she should have another week to 2 of antibiotics. I'll need to see if she is followed up with infectious disease Dr. Megan Salon 07/09/17; the patient is followed up with Dr. Megan Salon. 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 She continues on Levaquin and metronidazole continuing both of these for now I think she is been on fr about 6 weeks. She still has some drainage but no pain. No fever. Her had been plans for her to go to the OR for operative debridement with her podiatrist [name], I am not exactly sure where that is. I'll probably slip a note  to [name] today. I note that she follows with Dr. Dwyane Dee of endocrinology. We have her recertified for hyperbaric oxygen. I have not heard about Dermagraft however I'll see if [name] is planning a skin substitute as well 07/16/17; the patient tells me she is just about out of Hanna. I'll need to check Dr. Hale Bogus last notes on this. She states she has plenty of Flagyl however. She comes in today complaining of pain in the right lateral foot which she said lasted for about a day. The wound on the right foot is actually much more medially. She also tells me that the [facility] cost a lot of pain in the left foot wound and she turned back to silver alginate. Finally Dermagraft has a $062 per application co-pay. She cannot afford this 07/23/17; patient arrives today with the wound not much smaller. There is not much new to add. She has not heard from [name] all try to put in a call to them today. She was asking about Dermagraft again and she has an over $376 per application co-pay she states that she would be willing to try to do a payment plan. I been tried to avoid this. We've been using silver alginate, I'll change to [facility] 07/30/17-She is here in follow-up evaluation for left foot ulcer. She continues hyperbaric medicine. The left foot ulcer is stable we will continue with same treatment plan 08/06/17; she is here for evaluation of her left foot ulcer. Currently being treated for hyperbarics or underlying osteomyelitis. She is completed antibiotics. The left foot ulcer is better smaller with healthier looking granulation. For various reasons I am not really clear on we never got her back to the OR with [name]. He did not respond to my secure text message. Nevertheless I think that surgery on this point is not necessary nor am I completely clear that a skin substitute is necessary The patient is complaining about pain on the outside of her right foot. 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 There is no erythema. She also states the foot is warm versus her other part of her upper leg and this is largely true.  It is not totally clear to me what's causing this. She thinks it's different from her usual neuropathy pain 08/13/17; she arrives in clinic today with a small wound which is superficial on her right first metatarsal head. 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 She tells [place] she was up on her feet over the Mother's Day celebration. ooThe large wound is on the left foot. Continues with hyperbarics for underlying osteomyelitis. We're using Hydrofera Blue. She asked me today about where we were with Dermagraft. I had actually excluded this because of the co- pay however she wants to assume this therefore I'll recheck the co-pay an order for next week. 08/20/17; the patient agreed to accept the co-pay of the first Apligraf which we applied today. She is disappointed she is finishing hyperbarics will run this through the insurance on the extent of the foot infection and the extent of the wound that she had however she is already had 60 dive's. Dermagraft No. 1 08/27/17; Dermagraft No. 2. She is not eligible for any more hyperbaric treatments this month. She reports a fair amount of drainage and she actually changed to the external dressings without disturbing the direct contact layer 09/03/17; the patient arrived in clinic today with the wound superficially looking quite healthy. Nice vibrant red tissue with some advancing epithelialization although not as much adherence of the flap as I might like. However she noted on her own fourth toe some bogginess and she brought that to our attention. Indeed this was boggy feeling like a possibility of subcutaneous fluid. 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 She is not been unwell. We've been using  Dermagraft 09/10/17; the culture that I did not last week was MRSA. She saw Dr. Megan Salon this morning who is going to start her on vancomycin. I had sent him a secure a text message yesterday. I also spoke with her podiatric surgeon [name] about surgery on this foot the options for conserving a functional foot etc. Promised me he would see her and will make back consultation today. Paradoxically her actual wound on the plantar aspect of her left foot looks really quite good. I had given her 5 days worth of Baxdella to cover her for MRSA. Her MRI came back showing osteomyelitis within the third metatarsal shaft and head and base of the third and fourth proximal phalanx. She had extensive inflammatory changes throughout the soft tissue of the lateral forefoot. With an ill-defined fluid around the fourth metatarsal extending into the plantar and dorsal soft tissues 09/19/17; the patient is actually on oral Septra and Flagyl. She apparently refused IV vancomycin. She also saw [name] at my request who is planning her for a left BKA sometime in mid July. MRI showed osteomyelitis within the third metatarsal shaft and head and the basis of the third and fourth proximal phalanx. I believe there was felt to be possible septic arthritis involving the third MTP. 09/26/17; the patient went back to Dr. Megan Salon at my suggestion and is now receiving IV daptomycin. Her wound continues to look quite good making the decision to proceed with a transmetatarsal amputation although more difficult for the patient. I believe in my extensive discussions with her she has a good sense of the pros and cons of this. I don't NV the tuft decision she has to make. She has an appointment with [name] I believe in mid  July and I previously spoken to him about this issue Has we had used 3 previous Dermagraft. Given the condition of the wound surface I went ahead and added the fourth one today area and I did this not fully  realizing that she'll be traveling to [place] next week. I'm hopeful she can come back in 2 weeks 10/21/17; Her same Dermagraft on for about 3-1/2 weeks. In spite of this the wound arrives looking quite healthy. There is been a lot of healing dimensions are smaller. Looking at the square shaped wound she has now there is some undermining and some depth medially under the undermining although I cannot palpate any bone. No surrounding infection is obvious. She has difficult questions about how to look at this going forward vis--vis amputations versus continued medical therapy. To be truthful the wound is looks so healthy and it is continued to contract. Hard to justify foot surgery at this point although I still told her that I think it might come to that if we are not able to eradicate the underlying MRSA. She is still highly at risk and she understands this 11/06/17 on evaluation today patient appears to be doing better in regard to her foot ulcer. She's been tolerating the dressing changes without complication. Currently she is here for her Dermagraft #6. Her wound continues to make excellent progress at this point. She does not appear to have any evidence of infection which is good news. 11/13/17 on evaluation today patient appears to be doing excellent at this time. She is here for repeat Dermagraft application. This is #7. Overall her wound seems to be making great progress. 12/05/17; the patient arrives with the wound in much better condition than when I last saw this almost 6 weeks ago. She still has a small probing area in the left metatarsal head region on the lateral aspect of her foot. We applied her last Dermagraft today. ooSince the last time she is here she has what appears to a been a blood blister on the plantar aspect of left foot although I don't see this is threatening. There is also a thick raised tissue on the right mid metatarsal head region. This was not there I don't think the  last time she was here 3 weeks ago. 12/12/17; the patient continues to have a small programming area in the left metatarsal head region on the lateral aspect of her foot which was the initial large surgical wound. I applied her last Apligraf last week. I'm going to use Endoform starting today ooUnfortunately she has an excoriated area in the left mid foot and the right mid foot. The left midfoot looks like a blistered area this was not opened last week it certainly is open today. Using silver alginate on these areas. She promises me she is offloading this. 12/19/17; the small probing area in the left metatarsal head eyes think is shallower. In general her original wound looks better. We've been using Endoform. The area inferiorly that I think was trauma last week still requires debridement a lot of nonviable surface which I removed. She still has an open open area distally in her foot ooSimilarly on the right foot there is tightly adherent surface debris which I removed. Still areas that don't look completely epithelialized. This is a small open area. We used silver alginate on these areas 12/26/2017; the patient did not have the supplies we ordered from last week including the Endoform. The original large wound on the left lateral foot looks healthy.  She still has the undermining area that is largely unchanged from last week. She has the same heavily callused raised edged wounds on the right mid and left midfoot. Both of these requiring debridement. We have been using silver alginate on these areas 01/02/2018; there is still supply issues. We are going to try to use Prisma but I am not sure she actually got it from what she is saying. She has a new open area on the lateral aspect of the left fourth toe [previous fifth ray amputation]. Still the one tunneling area over the fourth metatarsal head. The area is in the midfoot bilaterally still have thick callus around them. She is concerned about a  raised swelling on the lateral aspect of the foot. However she is completely insensate 01/10/2018; we are using Prisma to the wounds on her bilateral feet. Surprisingly the tunneling area over the left fourth metatarsal head that was part of her original surgery has closed down. She has a small open area remaining on the incision line. 2 open areas in the midfoot. 02/10/2018; the patient arrives back in clinic after a month hiatus. She was traveling to visit family in [place]. Is fairly clear she was not offloading the areas on her feet. The original wound over the left lateral foot at the level of metatarsal heads is reopened and probes medially by about a centimeter or 2. She notes that a week ago she had purulent drainage come out of an area on the left midfoot. Paradoxically the worst area is actually on the right foot is extensive with purulent drainage. We will use silver alginate today 02/17/2018; the patient has 3 wounds one over the left lateral foot. She still has a small area over the metatarsal heads which is the remnant of her original surgical wound. This has medial probing depth of roughly 1.4 cm somewhat better than last week. The area on the right foot is larger. We have been using silver alginate to all areas. The area on the right foot and left foot that we cultured last week showed both Klebsiella and Proteus. Both of these are quinolone sensitive. The patient put her's self on Bactrim and Flagyl that she had left hanging around from prior antibiotic usages. She was apparently on this last week when she arrived. I did not realize this. Unfortunately the Bactrim will not cover either 1 of these organisms. We will send in Cipro 500 twice daily for a week 03/04/2018; the patient has 2 wounds on the left foot one is the original wound which was a surgical wound for a deep DFU. At one point this had exposed bone. She still has an area over the fourth metatarsal head that  probes about 1.4 cm although I think this is better than last week. I been using silver nitrate to try and promote tissue adherence and been using silver alginate here. ooShe also has an area in the left midfoot. This has some depth but a small linear wound. Still requiring debridement. ooOn the right midfoot is a circular wound. A lot of thick callus around this area. ooWe have been using silver alginate to all wound areas ooShe is completed the ciprofloxacin I gave her 2 weeks ago. 03/11/2018; the patient continues to have 2 open areas on the left foot 1 of which was the original surgical wound for a deep DFU. Only a small probing area remains although this is not much different from last week we have been using silver alginate. The other  area is on the midfoot this is smaller linear but still with some depth. We have been using silver alginate here as well ooOn the right foot she has a small circular wound in the mid aspect. This is not much smaller than last time. We have been using silver alginate here as well 03/18/2018; she has 3 wounds on the left foot the original surgical wound, a very superficial wound in the mid aspect and then finally the area in the mid plantar foot. She arrives in today with a very concerning area in the wound in the mid plantar foot which is her most proximal wound. There is undermining here of roughly 1-1/2 cm superiorly. Serosanguineous drainage. She tells me she had some pain on for over the weekend that shot up her foot into her thigh and she tells me that she had a nodule in the groin area. ooShe has the single wound in the right foot. ooWe are using endoform to both wound areas 03/24/2018; the patient arrives with the original surgical wound in the area on the left midfoot about the same as last week. There is a collection of fluid under the surface of the skin extending from the surgical wound towards the midfoot although it does not reach the  midfoot wound. The area on the right foot is about the same. Cultures from last week of the left midfoot wound showed abundant Klebsiella abundant Enterococcus faecalis and moderate methicillin resistant staph I gave her Levaquin but this would have only covered the Klebsiella. She will need linezolid 04/01/2018; she is taking linezolid but for the first few days only took 1 a day. I have advised her to finish this at twice daily dosing. In any case all of her wounds are a lot better especially on the left foot. The original surgical wound is closed. The area on the left midfoot considerably smaller. The area on the right foot also smaller. 04/08/2018; her original surgical wound/osteomyelitis on the left foot remains closed. She has area on the left foot that is in the midfoot area but she had some streaking towards this. This is not connected with her original wound at least not visually. ooSmall wound on the right midfoot appears somewhat smaller. 04/15/18; both wounds looks better. Original wound is better left midfoot. Using silver alginate 1/21; patient states she uses saltwater soak in, stones or remove callus from around her wounds. She is also concerned about a blood blister she had on the left foot but it simply resolved on its own. We've been using silver alginate 1/28; the patient arrives today with the same streaking area from her metatarsals laterally [the site of her original surgical wound] down to the middle of her foot. There is some drainage in the subcutaneous area here. This concerns me that there is actually continued ongoing infection in the metatarsals probably the fourth and third. This fixates an MRI of the foot without contrast [chronic renal failure] ooThe wound in the mid part of the foot is small but I wonder whether this area actually connects with the more distal foot. ooThe area on the right midfoot is probably about the same. Callus thick skin around the small  wound which I removed with a curette we have been using silver alginate on both wound areas 2/4; culture I did of the draining site on the left foot last time grew methicillin sensitive staph aureus. MRI of the left foot showed interval resolution of the findings surrounding the third metatarsal joint  on the prior study consistent with treated osteomyelitis. Chronic soft tissue ulceration in the plantar and lateral aspect of the forefoot without residual focal fluid collection. No evidence of recurrent osteomyelitis. 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 She tells me she had nausea and vomiting over the weekend and missed 2 days. In general her foot looks much the same. She has a small open area just below the left fourth metatarsal head. A linear area in the left midfoot. Some discoloration extending from the inferior part of this into the left lateral foot although this appears to be superficial. She has a small area on the right midfoot which generally looks smaller after debridement 2/18; the patient is completing his cephalexin and has another 2 days. She continues to have open areas on the left and right foot. 2/25; she is now off antibiotics. The area on the left foot at the site of her original surgical wound has closed yet again. She still has open areas in the mid part of her foot however these appear smaller. The area on the right mid foot looks about the same. We have been using silver alginate She tells me she had a serious hypoglycemic spell at home. She had to have EMS called and get IV dextrose 3/3; disappointing on the left lateral foot large area of necrotic tissue surrounding the linear area. This appears to track up towards the  same original surgical wound. Required extensive debridement. The area on the right plantar foot is not a lot better also using silver 3/12; the culture I did last time showed abundant enterococcus. I have prescribed Augmentin, should cover any unrecognized anaerobes as well. In addition there were a few MRSA and Serratia that would not be well covered although I did not want to give her multiple antibiotics. She comes in today with a new wound in the right midfoot this is not connected with the original wound over her MTP a lot of thick callus tissue around both wounds but once again she said she is not walking on these areas 3/17-Patient comes in for follow-up on the bilateral plantar wounds, the right midfoot and the left plantar wound. Both these are heavily callused surrounding the wounds. We are continuing to use silver alginate, she is compliant with offloading and states she uses a wheelchair fairly often at home 3/24; both wound areas have thick callus. However things actually look quite a bit better here for the majority of her left foot and the right foot. 3/31; patient continues to have thick callused somewhat irritated looking tissue around the wounds which individually are fairly superficial. There is no evidence of surrounding infection. We have been using silver alginate however I change that to [facility] today 4/17; patient returns to clinic after having a scare with Covid she tested negative in her primary doctor's office. She has been using Hydrofera Blue. She does not have an open area on the right foot. On the left foot she has a small open area with the mid area not completely viable. She showed me pictures of what looks like a hemorrhagic blister from several days ago but that seems to have healed over this was on the lateral left foot 4/21; patient comes in to clinic with  both her wounds on her feet closed. However over the weekend she started having pain in her right  foot and leg up into the thigh. She felt as though she was running a low-grade fever but did not take her temperature. She took a doxycycline that she had leftover and yesterday a single Septra and metronidazole. She thinks things feel somewhat better. 4/28; duplex ultrasound I ordered last week was negative for DVT or superficial thrombophlebitis. She is completed the doxycycline I gave her. States she is still having a lot of pain in the right calf and right ankle which is no better than last week. She cannot sleep. She also states she has a temperature of up to 101, coughing and complaining of visual loss in her bilateral eyes. Apparently she was tested for Covid 2 weeks ago at [facility] and that was negative. Readmission: 09/03/18 patient presents back for reevaluation after having been evaluated at the end of April regarding erythema and swelling of her right lower extremity. Subsequently she ended up going to the hospital on 07/29/18 and was admitted not to be discharged until 08/08/18. Unfortunately it was noted during the time that she was in the hospital that she did have methicillin-resistant Staphylococcus aureus as the infection noted at the site. It was also determined that she did have osteomyelitis which appears to be fairly significant. She was treated with vancomycin and in fact is still on IV vancomycin at dialysis currently. This is actually slated to continue until 09/12/18 at least which will be the completion of the six weeks of therapy. Nonetheless based on what I'm seeing at this point I'm not sure she will be anywhere near ready to discontinue antibiotics at that time. Since she was released from the hospital she was seen by [name] who is her podiatrist on 08/27/18. His note specifically states that he is recommended that the patient needs of one knee amputation on the right as she has a life-threatening situation that can lead quickly to sepsis. The patient advised she would  like to try to save her leg to which [name] apparently told her that this was against all medical advice. She also want to discontinue the Wound VAC which had been initiated due to the fact that she wasn't pleased with how the wound was looking and subsequently she wanted to pursue applying Medihoney at that time. He stated that he did not believe that the right lower extremity was salvageable and that the patient understood but would still like to attempt hyperbaric option therapy if it could be of any benefit. She was therefore referred back to [place] for further evaluation. He plans to see her back next week. Upon inspection today patient has a significant amount purulent drainage noted from the wound at this point. The bone in the distal portion of her foot also appears to be extremely necrotic and spongy. When I push down on the bone it bubbles and seeps purulent drainage from deeper in the end of the foot. I do not think that this is likely going to heal very well at all and less aggressive surgical debridement were undertaken more than what I believe we can likely do here in our office. 09/12/2018; I have not seen this patient since the most recent hospitalization although she was in our clinic last week. I have reviewed some of her records from a complex hospitalization. 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 There is situation  was complicated by MRSA bacteremia and acute on chronic renal failure now on dialysis. She is receiving vancomycin at dialysis. We started her on Dakin's wet-to- dry last week she is changing this daily. There is still purulent drainage coming out of her foot. Although she is apparently "agreeable" to a below-knee amputation which is been suggested by multiple clinicians she wants this to be done in [place]. She apparently has a telehealth visit with that provider sometime in late [place] 6/24. I have told her I  think this is probably too long. Nevertheless I could not convince her to allow a local doctor to perform BKA. 09/19/2018; the patient has a large necrotic area on the right anterior foot. She has had previous transmetatarsal amputations. Culture I did last week showed MRSA nothing else she is on vancomycin at dialysis. She has continued leaking purulent drainage out of the distal part of the large circular wound on the right anterior foot. She apparently went to see Dr. 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 Somehow that translated into her being referred to plastic surgery for debridement of the area. I gather she basically refused amputation although I do not have a copy of Dr. Berenice Primas notes. The patient really wants to have a trial of hyperbaric oxygen. I agreed with initial assessment in this clinic that this was probably too far along to benefit however if she is going to have plastic surgery I think she would benefit from ancillary hyperbaric oxygen. The issue here is that the patient has benefited as maximally as any patient I have ever seen from hyperbaric oxygen therapy. Most recently she had exposed bone on the lateral part of her left foot after a surgical procedure and that actually has closed. She has eschared areas in both heels but no open area. She is remained systemically well. I am not optimistic that anything can be done about this but the patient is very clear that she wants an attempt. The attempt would include a wound VAC further debridements and hyperbaric oxygen along with IV antibiotics. 6/26; I put her in for a trial of hyperbaric oxygen only because of the dramatic response she has had with wounds on her left midfoot earlier this year which was a surgical wound that went straight to her bone over the metatarsal heads and also remotely the left third toe. We will see if we can get this through our review process and insurance. She arrives  in clinic with again purulent material pouring out of necrotic bone on the top of the foot distally. There is also some concerning erythema on the front of the leg that we marked. It is bit difficult to tell how tender this is because of neuropathy. I note from infectious disease that she had her vancomycin extended. 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 She had the wound VAC on for part of the week. The rest of the time she is putting various things on this including Medihoney, "ionized water" silver sorb gel etc. 7/7; follow-up along with HBO. She is still on vancomycin at dialysis. She has a large open area on the dorsal right foot and a small dark eschar area on her heel. There is a lot less erythema in the area and a lot less tenderness. From an infection point of view I think this is better. She still has a lot of necrosis in the remaining right forefoot [previous TMA] we are still using the  wound VAC in this area 7/16; follow-up along with HBO. I put her on linezolid after she finished her vancomycin. We started this last Friday I gave her 2 weeks worth. I had the expectation that she would be operatively debrided by Dr. Marla Roe but that still has not happened yet. Patient phoned the office this week. She arrives for review today after HBO. The distal part of this wound is completely necrotic. Nonviable pieces of tendon bone was still purulent drainage. Also concerning that she has black eschar over the heel that is expanding. I think this may be indicative of infection in this area as well. She has less erythema and warmth in the ankle and calf but still an abnormal exam 7/21 follow-up along with HBO. I will renew her linezolid after checking a CBC with differential monitoring her blood counts especially her platelets. She was supposed to have surgery yesterday but if I am reading things correctly this was canceled after her blood sugar was found to be  over 500. I thought Dr. Marla Roe who called me said that they were sending her to the ER but the patient states that was not the case. 7/28. Follow-up along with HBO. She is on linezolid I still do not have any lab work from dialysis even though I called last week. The patient is concerned about an area on her left lateral foot about the level of the base of her fifth metatarsal. I did not really see anything that ominous here however this patient is in [place] ability to point out problems that she is sensing and she has been accurate in the past Finally she received a call from Dr. Marla Roe who is referring her to another orthopedic surgeon stating that she is too booked up to take her to the operating room now. Was still using a wound VAC on the foot 8/3 -Follow-up after HBO, she is got another week of linezolid, she is to call [facility] for an appointment, x-rays of both feet were reviewed, the left foot x-ray with third MTP joint osteo- Right foot x-ray widespread osteo-in the right midfoot Right ankle x-ray does not show any active evidence of infection 8/11-Patient is seen after HBO, the wounds on the right foot appear to be about the same, the heel wound had some necrotic base over tendon that was debrided with a curette 8/21; patient is seen after HBO. The patient's wound on her dorsal foot actually looks reasonably good and there is substantial amount of epithelialization however the open area distally still has a lot of necrotic debris partially bone. I cannot really get a good sense of just how deep this probes under the foot. She has been pressuring me this week to order medical maggots through a company in [place] for her. The problem I have is there is not a defined wound area here. On the positive side there is no purulence. She has been to see infectious disease she is still on Septra DS although I have not had a chance to review their notes 8/28; patient is seen in conjunction  with HBO. The wounds on her foot continued to improve including the right dorsal foot substantially the, the distal part of this wound and the area on the right heel. We have been using a wound VAC over this chronically. She is still on trimethoprim as directed by infectious disease 9/4; patient is seen in conjunction with HBO. Right dorsal foot wound substantially anteriorly is better however she continues to have a  deep wound in the distal part of this that is not responding. We have been using silver collagen under border foam ooArea on the right plantar medial heel seems better. We have been using Hydrofera Blue 12/12/18 on evaluation today patient appears to be doing about the same with regard to her wound based on prior measurements. She does have some necrotic tissue noted on the lateral aspect of the wound that is going require a little bit of sharp debridement today. This includes what appears to be potentially either severely necrotic bone or tendon. Nonetheless other than that she does not appear to have any severe infection which is good news 9/18; it is been 2 weeks since I saw this wound. She is tolerating HBO well. Continued dramatic improvement in the area on the right dorsal foot. She still has a small wound on the heel that we have been using Hydrofera Blue. She continues with a wound VAC 9/24; patient has to be seen emergently today with a swelling on her right lateral lower leg. She says that she told Dr. Evette Doffing about this and also myself on a couple of occasions but I really have no recollection of this. She is not systemically unwell and her wound really looked good the last time I saw this. She showed this to providers at dialysis and she was able to verify that she was started on cephalexin today for 5 doses at dialysis. She dialyzes on Tuesday Thursday and Saturday. 10/2; patient is seen in conjunction with HBO. The area that is draining on the right anterior medial tibia  is more extensive. Copious amounts of serosanguineous drainage with some purulence. We are still using the wound VAC on the original wound then it is stable. Culture I did of the original IandD showed MRSA I contacted dialysis she is now on vancomycin with dialysis treatments. I asked them to run a month 10/9; patient seen in conjunction with HBO. She had a new spontaneous open area just above the wound on the right medial tibia ankle. More swelling on the right medial tibia. Her wound on the foot looks about the same perhaps slightly better. There is no warmth spreading up her leg but no obvious erythema. her MRI of the foot and ankle and distal tib-fib is not booked for next Friday I discussed this with her in great detail over multiple days. it is likely she has spreading infection upper leg at least involving the distal 25% above the ankle. She knows that if I refer her to orthopedics for infectious disease they are going to recommend amputation and indeed I am not against this myself. We had a good trial at trying to heal the foot which is what she wanted along with antibiotics debridement and HBO however she clearly has spreading infection [probably staph aureus/MRSA]. Nevertheless she once again tells me she wants to wait the left of the MRI. She still makes comments about having her amputation done in [place]. 10/19; arrives today with significant swelling on the lateral right leg. Last culture I did showed Klebsiella. Multidrug- resistant. Cipro was intermediate sensitivity and that is what I have her on pending her MRI which apparently is going to be done on Thursday this week although this seems to be moving back and forth. She is not systemically unwell. We are using silver alginate on her major wound area on the right medial foot and the draining areas on the right lateral lower leg 10/26; MRI showed extensive abscess in the anterior  compartment of the right leg also widespread  osteomyelitis involving osseous structures of the midfoot and portions of the hindfoot. Also suspicion for osteomyelitis anterior aspect of the distal medial malleolus. Culture I did of the purulence once again showed a multidrug-resistant Klebsiella. I have been in contact with nephrology late last week and she has been started on cefepime at dialysis to replace the vancomycin We sent a copy of her MRI report to Dr. Geroge Baseman in [place] who is an orthopedic surgeon. The patient takes great stock in his opinion on this. She says she will go to [place] to have her leg amputated if Dr. Geroge Baseman does not feel there is any salvage options. 11/2; she still is not talk to her orthopedic surgeon in [place]. Apparently he will call her at [phone] this afternoon. The quality of this is she has not allowed me to refer her anywhere. She has been told over and over that she needs this amputated but has not agreed to be referred. She tells me her blood sugar was 600 last night but she has not been febrile. 11/9; she never did got a call from the orthopedic surgeon in [place] therefore that is off the radar. We have arranged to get her see orthopedic surgery at [facility]. She still has a lot of draining purulence coming out of the new abscess in her right leg although that probably came from the osteomyelitis in her right foot and heel. Meanwhile the original wound on the right foot looks very healthy. Continued improvement. The issue is that the last MRI showed osteomyelitis in her right foot extensively she now has an abscess in the right anterior lower leg. There is nobody in [place] who will offer this woman anything but an amputation and to be honest that is probably what she needs. I think she still wants to talk about limb salvage although at this point I just do not see that. She has completed her vancomycin at dialysis which was for the original staph aureus she is still on cefepime for the  more recent Klebsiella. She has had a long course of both of these antibiotics which should have benefited the osteomyelitis on the right foot as well as the abscess. 11/16; apparently [place] elective surgery is shut down because of COVID-19 pandemic. I have reached out to some contacts at [facility] to see if we can get her an orthopedic appointment there. I am concerned about continually leaving this but for the moment everything is static. In fact her original large wound on this foot is closing down. It is the abscess on the right anterior leg that continues to drain purulent serosanguineous material. She is not currently on any antibiotics however she had a prolonged course of vancomycin [1 month] as well as cefepime for a month 02/24/2019 on evaluation today patient appears to be doing better than the last time I saw her. This is not a patient that I typically see. With that being said I am covering for Dr. Dellia Nims this week and again compared to when I last saw her overall the wounds in particular seem to be doing significantly better which is good news. With that being said the patient tells me several disconcerting things. She has not been able to get in to see anyone for potential debridement in regard to her leg wounds although she tells me that she does not think it is necessary any longer because she is taking care of that herself. She noticed a string coming out  of the lower wound on her leg over the last week. The patient states that she subsequently decided that we must of pack something in there and started pulling the string out and as it kept coming and coming she realized this was likely her tendon. With that being said she continued to remove as much of this as she could. She then I subsequently proceeded to using tubes of antibiotic ointment which she will stick down into the wound and then scored as much as she can until she sees it coming out of the other wound opening.  She states that in doing this she is actually made things better and there is less redness and irritation. With regard to her foot wound she does have some necrotic tendon and tissue noted in one small corner but again the actual wound itself seems to be doing better with good granulation in general compared to my last evaluation. 12/7; continued improvement in the wound on the substantial part of the right medial foot. Still a necrotic area inferiorly that required debridement but the rest of this looks very healthy and is contracting. She has 2 wounds on the right lateral leg which were her original drainage sites from her abscess but all of this looks a lot better as well. She has been using silver alginate after putting antibiotic biotic ointment in one wound and watching it come out the other. I have talked to her in some detail today. I had given her names of orthopedic surgeons at [facility] for second opinion on what to do about the right leg. I do not think the patient never called them. She has not been able to get a hold of the orthopedic surgeon in [place] that she had put a lot of faith in as being somebody would give her an opinion that she would trust. I talked to her today and said even if I could get her in to another orthopedic surgeon about the leg which she accept an amputation and she said she would not therefore I am not going to press this issue for the moment 12/14; continued improvement in his substantial wound on the right medial foot. There is still a necrotic area inferiorly with tightly adherent necrotic debris which I have been working on debriding each time she is here. She does not have an orthopedic appointment. Since last time she was here I looked over her cultures which were essentially MRSA on the foot wound and gram- negative rods in the abscess on the anterior leg. 12/21; continued improvement in the area on the right medial foot. She is not up on this  much and that is probably a good thing since I do not know it could support continuous ambulation. She has a small area on the right lateral leg which were remanence of the IandD's I did because of the abscess. I think she should probably have prophylactic antibiotics I am going to have to look this over to see if we can make an intelligent decision here. In the meantime her major wound is come down nicely. Necrotic area inferiorly is still there but looks a lot better 04/06/2019; she has had some improvement in the overall surface area on the right medial foot somewhat narrowedr both but somewhat longer. The areas on the right lateral leg which were initial IandD sites are superficial. Nothing is present on the right heel. We are using silver alginate to the wound areas 1/18; right medial foot somewhat smaller. Still a  deep probing area in the most distal recess of the wound. She has nothing open on the right leg. She has a new wound on the plantar aspect of her left fourth toe which may have come from just pulling skin. The patient using Medihoney on the wound on her foot under silver alginate. I cannot discourage her from this 2/1; 2-week follow-up using silver alginate on the right foot and her left fourth toe. The area on the right dorsal foot is contracted although there is still the deep area in the most distal part of the wound but still has some probing depth. No overt infection 2/15; 2-week follow-up. She continues to have improvement in the surface area on the dorsal right foot. Even the tunneling area from last time is almost closed. The area that was on the plantar part of her left fourth toe over the PIP is indeed closed 3/1; 2-week follow-up. Continued improvement in surface area. The original divot that we have been debriding inferiorly I think has full epithelialization although the epithelialization is gone down into the wound with probably 4 mm of depth. Even under intense  illumination I am unable to see anything open here. The remanence of the wound in this area actually look quite healthy. We have been using silver alginate 3/15; 2-week follow-up. Unfortunately not as good today. She has a comma shaped wound on the dorsal foot however the upper part of this is larger. Under illumination debris on the surface She also tells [place] that she was on her right leg 2 times in the last couple of weeks mostly to reach up for things above her head etc. She felt a sharp pain in the right leg which she thinks is somewhere from the ankle to the knee. The patient has neuropathy and is really uncertain. She cannot feel her foot so she does not think it was coming from there Objective Constitutional Patient is hypertensive.. Pulse regular and within target range for patient.Marland Kitchen Respirations regular, non-labored and within target range.. Temperature is normal and within the target range for the patient.Marland Kitchen Appears in no distress. Vitals Time Taken: 12:42 PM, Height: 67 in, Weight: 125 lbs, BMI: 19.6, Temperature: 98.1 F, Pulse: 89 bpm, Respiratory Rate: 18 breaths/min, Blood Pressure: 173/88 mmHg. General Notes: Wound exam; the wound did not look too bad however it is larger especially proximally. I debrided the surface of the wound with a #5 curette to get the debris under illumination off. This looks like fibrinous necrotic debris nothing special. There was nothing that looked infected. Her right foot was not warm the ankle had no effusion. No palpable tenderness along the tibia. There was no fluid on the knee however she does have some ligamentous instability probably in the collateral ligaments but this did not seem to be the source of this. Integumentary (Hair, Skin) Wound #43 status is Open. Original cause of wound was Gradually Appeared. The wound is located on the Right,Medial Foot. The wound measures 4.1cm length x 2.5cm width x 0.2cm depth; 8.05cm^2 area and  1.61cm^3 volume. There is Fat Layer (Subcutaneous Tissue) Exposed exposed. There is no tunneling or undermining noted. There is a medium amount of serosanguineous drainage noted. The wound margin is flat and intact. There is medium (34-66%) red, pink granulation within the wound bed. There is a medium (34-66%) amount of necrotic tissue within the wound bed including Adherent Slough. Assessment Active Problems ICD-10 Other chronic osteomyelitis, right ankle and foot Non-pressure chronic ulcer of other part of  right foot with necrosis of bone Type 1 diabetes mellitus with foot ulcer Procedures Wound #43 Pre-procedure diagnosis of Wound #43 is a Diabetic Wound/Ulcer of the Lower Extremity located on the Right,Medial Foot .Severity of Tissue Pre Debridement is: Fat layer exposed. There was a Excisional Skin/Subcutaneous Tissue Debridement with a total area of 10.25 sq cm performed by Ricard Dillon., MD. With the following instrument(s): Curette to remove Viable and Non-Viable tissue/material. Material removed includes Subcutaneous Tissue and Slough and. No specimens were taken. A time out was conducted at 12:58, prior to the start of the procedure. A Minimum amount of bleeding was controlled with Pressure. The procedure was tolerated well with a pain level of 0 throughout and a pain level of 0 following the procedure. Post Debridement Measurements: 4.1cm length x 2.5cm width x 0.2cm depth; 1.61cm^3 volume. Character of Wound/Ulcer Post Debridement is improved. Severity of Tissue Post Debridement is: Fat layer exposed. Post procedure Diagnosis Wound #43: Same as Pre-Procedure Plan Follow-up Appointments: Return Appointment in 1 week. Dressing Change Frequency: Wound #43 Right,Medial Foot: Change dressing three times week. Wound Cleansing: Clean wound with Wound Cleanser - Anasept to all wounds Primary Wound Dressing: Wound #43 Right,Medial Foot: Calcium Alginate with Silver Other: -  may use thin layer of medihoney under alginate Secondary Dressing: Wound #43 Right,Medial Foot: Kerlix/Rolled Gauze Dry Gauze Edema Control: Elevate legs to the level of the heart or above for 30 minutes daily and/or when sitting, a frequency of: - throughout the day Home Health: [facility] for wound care. - Interim Radiology ordered were: X-ray, right tibia/fibula - Pain in right lower leg, questionable fracture 1. I am going to go ahead and x-ray of the right tib-fib pain in the mid leg when she was standing. 2. The wound on the dorsal foot measures a little larger but really does not look too bad. 3. No evidence of overt infection. She had a large abscess in the right leg lateral part at one point although I cannot easily find any evidence of that recurring. Anything the patient says or complains about often has a significant basis to it I am hopeful that is not the case Electronic Signature(s) Signed: 06/16/2019 10:32:20 AM By: Linton Ham MD Entered By: Linton Ham on 06/15/2019 13:34:35 -------------------------------------------------------------------------------- SuperBill Details Patient Name: Date of Service: [name] 06/15/2019 Medical Record Number:[id number] Patient Account Number: [id number] Date of Birth/Sex: Treating RN: [id number] (49 y.o. Nancy Fetter Primary Care Provider: [name] Other Clinician: Referring Provider: Treating Provider/Extender:Shahara Hartsfield, [name], [name] in Treatment: 40 Diagnosis Coding ICD-10 Codes Code Description [phone] Other chronic osteomyelitis, right ankle and foot L97.514 Non-pressure chronic ulcer of other part of right foot with necrosis of bone E10.621 Type 1 diabetes mellitus with foot ulcer Facility Procedures CPT4 Code Description: [id number] 11042 - DEB SUBQ TISSUE 20 SQ CM/< ICD-10 Diagnosis Description L97.514 Non-pressure chronic ulcer of other part of right  foot wit Modifier: h necrosis of Quantity: 1 bone Physician Procedures CPT4 Code Description: [id number] 11042 - WC PHYS SUBQ TISS 20 SQ CM ICD-10 Diagnosis Description L97.514 Non-pressure chronic ulcer of other part of right foot wit Modifier: h necrosis of Quantity: 1 bone Electronic Signature(s) Signed: 06/16/2019 10:32:20 AM By: Linton Ham MD Entered By: Linton Ham on 06/15/2019 13:34:53

## 2019-06-22 ENCOUNTER — Encounter (HOSPITAL_BASED_OUTPATIENT_CLINIC_OR_DEPARTMENT_OTHER): Payer: Medicare HMO | Admitting: Internal Medicine

## 2019-06-29 ENCOUNTER — Encounter (HOSPITAL_BASED_OUTPATIENT_CLINIC_OR_DEPARTMENT_OTHER): Payer: Medicare HMO | Admitting: Internal Medicine

## 2019-06-29 ENCOUNTER — Other Ambulatory Visit: Payer: Self-pay

## 2019-06-29 DIAGNOSIS — E10621 Type 1 diabetes mellitus with foot ulcer: Secondary | ICD-10-CM | POA: Diagnosis not present

## 2019-06-29 NOTE — Progress Notes (Signed)
[name], [name] ([id number]) Visit Report for 06/29/2019 HPI Details Patient Name: Date of Service: [name], [name] 06/29/2019 12:45 PM Medical Record Number:[id number] Patient Account Number: [id number] Date of Birth/Sex: Treating RN: 1970-12-01 (49 y.o. Nancy Fetter Primary Care Provider: [name] Other Clinician: Referring Provider: Treating Provider/Extender:Erik Burkett, [name], [name] in Treatment: 12 History of Present Illness HPI Description: 49 year old diabetic who is known to have type 1 diabetes which is poorly controlled last hemoglobin A1c was 11%. She comes in with a ulcerated area on the left lateral foot which has been there for over 6 months. Was recently she has been treated by [name] of podiatry who saw her last on 05/28/2016. Review of his notes revealed that the patient had incision and drainage with placement of antibiotic beads to the left foot on 04/11/2016 for possible osteomyelitis of the cuboid bone. Over the last year she's had a history of amputation of the left fifth toe and a femoropopliteal popliteal bypass graft somewhere in April 2017. 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 His note [name] mentions that the patient has been referred to me for further wound care and possibly great candidate for hyperbaric oxygen therapy due to recurrent osteomyelitis. However we do not have any x-rays of biopsy reports confirming this. He has been on several antibiotics including Bactrim and most recently is on doxycycline for an MRSA. I understand, the patient was not a candidate for IV antibiotics as she has had previous PICC lines which resulted in blood clots in both arms. There was a x-ray report dated 04/04/2016 on [name] notes which showed evidence of fifth ray resection left foot with osteolytic changes noted to the fourth metatarsal and cuboid bone on the left. 06/13/2016 -- had a left foot x-ray which showed  no acute fracture or dislocation and no definite radiographic evidence of osteomyelitis. Advanced osteopenia was seen. 06/20/2016 -- she has noticed a new wound on the right plantar foot in the region where she had a callus before. 06/27/16- the patient did have her x-ray of the right foot which showed no findings to suggest osteomyelitis. She saw her endocrinologist, [name], yesterday. Her A1c in January was 11. He also indicates mismanagement and noncompliance regarding her diabetes. She is currently on Bactrim for a lip infection. She is complaining of nausea, vomiting and diarrhea. She is unable to articulate the exact orders or dosing of the Bactrim; it is unclear when she will complete this. 07/04/2016 -- results from [facility] of ABIs with ankle waveforms were noted from 02/14/2016. The examination done on 06/27/2015 showed noncompressible ABIs with the right being 1.45 and the left being 1.33. The present examination showed a right ABI of 1.19 on the left of 1.33. The conclusion was that right normal ABI in the lower extremity at rest however compared to previous study which was noncompressible ABI may be falsely elevated side suggesting medial calcification. The left ABI suggested medial calcification. 08/01/2016 -- the patient had more redness and pain on her right foot and did not get to come to see as noted she see her PCP or go to the ER and decided to take some leftover metronidazole which she had at home. As usual, the patient does report she feels and is rather noncompliant. 08/08/2016 -- -- x-ray of the right foot -- FINDINGS:Transmetatarsal amputation is noted. No bony destruction is noted to suggest osteomyelitis. IMPRESSION: No evidence of osteomyelitis. Postsurgical changes are seen. MRI would be more sensitive  for possible bony changes. Culture has grown Serratia Marcescens -- sensitive to Bactrim, ciprofloxacin, ceftazidime she was seen by [name] on  08/06/2016. He did not find any exposed bone, muscle, tendon, ligament or joint. There was no malodor and he did a excisional debridement in the office. ============ Old notes: 49 year old patient who is known to the wound clinic for a while had been away from the wound clinic since 09/01/2014. Over the last several months she has been admitted to various hospitals including [place] [facility]. She was treated for a right metatarsal osteomyelitis with a transmetatarsal amputation and this was done about 2 months ago. He has a small ulcerated area on the right heel and she continues to have an ulcerated area on the left plantar aspect of the foot. The patient was recently admitted to the [facility] hospital group between 7/12 and 10/18/2014. she was given 3 weeks of IV vancomycin and was to follow-up with her surgeons at [facility] and also took oral vancomycin for C. difficile colitis. 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 MRI of the left foot done on 10/14/2014 showed no abscess or osteomyelitis. 04/27/15; this is a patient we know from previous stays in the wound care center. She is a type I diabetic I am not sure of her control currently. Since the last time I saw her she is had a right transmetatarsal amputation and has no wounds on her right foot and has no open wounds. She is been followed at the wound care center at [facility] in [place]. She comes today with the desire to undergo hyperbaric treatment locally. Apparently one of her wound care providers in [place] has suggested hyperbarics. This is in response to an MRI from 04/18/15 that showed increased marrow signal and loss of the proximal  fifth metatarsal cortex evidence of osteomyelitis with likely early osteomyelitis in the cuboid bone as well. She has a large wound over the base of the fifth metatarsal. She also has a eschar over her the tips of her toes on 1,3 and 5. She does not have peripheral pulses and apparently is going for an angiogram tomorrow which seems reasonable. After this she is going to infectious disease at [facility]. They have been using Medihoney to the large wound on the lateral aspect of the left foot to. The patient has known Charcot deformity from diabetic neuropathy. She also has known diabetic PAD. Surprisingly I can't see that she has had any recent antibiotics, the patient states the last antibiotic she had was at the end of November for 10 days. I think this was in response to culture that showed group G strep although I'm not exactly sure where the culture was from. She is also had arterial studies on 03/29/15. This showed a right ABI of 1.4 that was noncompressible. Her left ABI was 0.73. 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 It was not felt that arterial inflow was adequate for healing of a foot ulcer. Her Doppler waveforms looked monophasic ===== READMISSION 02/28/17; this is in an now 49 year old woman we've had at several different occasions in this clinic. 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 She has a remote ex-smoker. She was last seen in this  clinic by Dr. Con Memos I think in May. More recently she is been followed by her podiatrist [name] an infectious disease Dr. Megan Salon. She has 2 open wounds the major one is over the right first metatarsal head she also has a wound on the left plantar foot. an MRI of the right foot on 01/01/17 showed a soft tissue ulcer along the plantar aspect of the first metatarsal base consistent with osteomyelitis of the first metatarsal stump. Dr. 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 According to the patient this is been open for slightly over a month. She has been on a combination of Cipro 500 twice a day, Zyvox 600 twice a day and Flagyl 500 3 times a day for over a month now as directed by Dr. Megan Salon. cultures of the right foot earlier this year showed MRSA in January and Serratia in May. January also had a few viridans strep. Recent x-rays of both feet were done and [name] office and I don't have these reports. The patient has known PAD and has a history of aleft femoropopliteal bypass in April 2017. 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 Dwyane Dee. In spite of this the last hemoglobin A1c I can see is 10.1 on 01/01/2017. She is being referred by 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 She is been using Medihoney to this area and also an area on the left midfoot. She is using healing sandals bilaterally. ABIs in this clinic at the left posterior tibial was 1.1 noncompressible on the right READMISSION Non invasive vascular NOVANT 5/18 Aftercare following surgery of the circulatory system Procedure Note - Interface, External Ris In - 08/13/2016 11:05 AM EDT Procedure: Examination consists of physiologic resting arterial pressures of the brachial and ankle arteries bilaterally with continuous wave Doppler waveform analysis. Previous: Previous exam performed on 02/14/16 demonstrated ABIs of Rt = 1.19 and Lt = 1.33. Right: ABI = non-compressible PT, 1.47 DP. S/P transmet amputation. Left: ABI = 1.52, 2nd digit pressure = 87 mmHg Conclusions: Right: ABI (>1.3) may be falsely elevated, suggesting medial calcification. Left: ABI (>1.3) may be  falsely elevated, suggesting medial calcification The patient is a now 49 year old type I diabetic is had multiple issues her graded to chronic diabetic foot ulcers. 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 We had her in the clinic lastin November. At that point she had wounds on her bilateral feet.she had wanted to try hyperbarics however the healogics review process denied her because she hadn't followed up with her vascular surgeon for her left femoropopliteal bypass. The bypass was done by Dr. Raul Del at [facility]. We made her a follow-up with Dr. Raul Del however she did not keep the appointment and therefore she was not approved The patient shows me a small wound on her left fourth metatarsal head on her phone. She developed rapid discoloration in the plantar aspect of the left foot and she was admitted to hospital from 2/2 through 05/10/17 with wet gangrene of the left foot osteomyelitis of the fourth metatarsal heads. She was admitted acutely ill with a temperature of 103. She was started on broad-spectrum vancomycin and cefepime. On 05/06/17  she was taken to the OR by [name] her podiatric surgeon for an incision and drainage irrigation of the left foot wound. Cultures from this surgery revealed group be strep and anaerobes. she was seen by Dr.Xu of orthopedic surgery and scheduled for a below-knee amputation which she u refused. Ultimately she was discharged on Levaquin and Flagyl for one month. MRI 05/05/17 done while she was in the hospital showed abscess adjacent to the fourth metatarsal head and neck small abscess around the fourth flexor tendon. Inflammatory phlegmon and gas in the soft tissues along the lateral aspect of the fourth phalanx. Findings worrisome for osteomyelitis involving the fourth proximal and middle phalanx and also the third and fourth metatarsals. Finally the patient had actually  shortly before this followed up with Dr. Raul Del at no time on 04/29/17. He felt that her left femoropopliteal bypass was patent he felt that her left-sided toe pressures more than adequate for healing a wound on the left foot. This was before her acute presentation. 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 05/28/17; she is started hyperbarics. The patient tells me that for some reason she was not actually on Levaquin but I think on ciprofloxacin. She was on Flagyl. She only started her Levaquin yesterday due to some difficulty with the pharmacy and perhaps her sister picking it up. She has an appointment with [name] tomorrow and with infectious disease early next week. She has no new complaints 06/06/17; the patient continues in hyperbarics. She saw [name] on 05/29/17 who is her podiatric surgeon. He is elected for a transmetatarsal amputation on 06/27/17. I'm not sure at what level he plans to do this amputation. The patient is unaware She also saw Dr. Megan Salon of infectious disease who elected to continue her on current antibiotics I think this is ciprofloxacin and Flagyl. I'll need to clarify with her tomorrow if she actually has this. We're using silver alginate to the actual wound. Necrotic surface today with material under the flap of her foot. Original MRI showed abscesses as well as osteomyelitis of the proximal and middle fourth phalanx and the third and fourth metatarsal heads 06/11/17; patient continues in hyperbarics and continues on oral antibiotics. She is doing well. The wound looks better. The necrotic part of this under the flap in her superior foot also looks better. she is been to see [name]. I haven't had a chance to look at his note. Apparently he has put the transmetatarsal amputation on hold her request it is still planning to take her to the OR for debridement and product application ACEL. I'll see if I can find his note. I'll therefore leave product ordering/requests  to [name] for now. I was going to look at Dermagraft 06/18/17-she is here in follow-up evaluation for bilateral foot wounds. She continues with hyperbaric therapy. She states she has been applying manuka honey to the right plantar foot and alternate manuka honey and silver alginate to the left foot, despite our orders. We will continue with same treatment plan and she will follo up next week. 06/25/17; I have reviewed [name] last note from 3/11. She has operative debridement in 2 days' time. By review his note apparently they're going to place there is skin over the majority of this wound which is a good choice. She has a small satellite area at the most proximal part of this wound on the left plantar foot. The area on the right plantar foot we've been using silver alginate and it is  close to healing. 07/02/17; unfortunately the patient was not easily approved for [name] proposed surgery. I'm not completely certain what the issue is. She has been using silver alginate to the wound she has completed a first course of hyperbarics. She is still on Levaquin and Flagyl. I have really lost track of the time course here.I suspect she should have another week to 2 of antibiotics. I'll need to see if she is followed up with infectious disease Dr. Megan Salon 07/09/17; the patient is followed up with Dr. Megan Salon. 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 She continues on Levaquin and metronidazole continuing both of these for now I think she is been on fr about 6 weeks. She still has some drainage but no pain. No fever. Her had been plans for her to go to the OR for operative debridement with her podiatrist [name], I am not exactly sure where that is. I'll probably slip a note to [name] today. I note that she follows with Dr. Dwyane Dee of endocrinology. We have her recertified for hyperbaric oxygen. I have not heard about Dermagraft however I'll see if [name]  is planning a skin substitute as well 07/16/17; the patient tells me she is just about out of [place]. I'll need to check Dr. Hale Bogus last notes on this. She states she has plenty of Flagyl however. She comes in today complaining of pain in the right lateral foot which she said lasted for about a day. The wound on the right foot is actually much more medially. She also tells me that the [facility] cost a lot of pain in the left foot wound and she turned back to silver alginate. Finally Dermagraft has a $678 per application co-pay. She cannot afford this 07/23/17; patient arrives today with the wound not much smaller. There is not much new to add. She has not heard from [name] all try to put in a call to them today. She was asking about Dermagraft again and she has an over $938 per application co-pay she states that she would be willing to try to do a payment plan. I been tried to avoid this. We've been using silver alginate, I'll change to [facility] 07/30/17-She is here in follow-up evaluation for left foot ulcer. She continues hyperbaric medicine. The left foot ulcer is stable we will continue with same treatment plan 08/06/17; she is here for evaluation of her left foot ulcer. Currently being treated for hyperbarics or underlying osteomyelitis. She is completed antibiotics. The left foot ulcer is better smaller with healthier looking granulation. For various reasons I am not really clear on we never got her back to the OR with [name]. He did not respond to my secure text message. Nevertheless I think that surgery on this point is not necessary nor am I completely clear that a skin substitute is necessary The patient is complaining about pain on the outside of her right foot. 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 There is no erythema. She also states the foot is warm versus her other part of her upper leg and this is largely true. It is not totally clear to me  what's causing this. She thinks it's different from her usual neuropathy pain 08/13/17; she arrives in clinic today with a small wound which is superficial on her right first metatarsal head. 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 She tells [place] she was up on  her feet over the Mother's Day celebration. The large wound is on the left foot. Continues with hyperbarics for underlying osteomyelitis. We're using Hydrofera Blue. She asked me today about where we were with Dermagraft. I had actually excluded this because of the co- pay however she wants to assume this therefore I'll recheck the co-pay an order for next week. 08/20/17; the patient agreed to accept the co-pay of the first Apligraf which we applied today. She is disappointed she is finishing hyperbarics will run this through the insurance on the extent of the foot infection and the extent of the wound that she had however she is already had 60 dive's. Dermagraft No. 1 08/27/17; Dermagraft No. 2. She is not eligible for any more hyperbaric treatments this month. She reports a fair amount of drainage and she actually changed to the external dressings without disturbing the direct contact layer 09/03/17; the patient arrived in clinic today with the wound superficially looking quite healthy. Nice vibrant red tissue with some advancing epithelialization although not as much adherence of the flap as I might like. However she noted on her own fourth toe some bogginess and she brought that to our attention. Indeed this was boggy feeling like a possibility of subcutaneous fluid. 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 She is not been unwell. We've been using Dermagraft 09/10/17; the culture that I did not last week was MRSA. She saw Dr. Megan Salon this morning who is going to start her on vancomycin. I had sent him a secure a text message yesterday. I also spoke  with her podiatric surgeon [name] about surgery on this foot the options for conserving a functional foot etc. Promised me he would see her and will make back consultation today. Paradoxically her actual wound on the plantar aspect of her left foot looks really quite good. I had given her 5 days worth of Baxdella to cover her for MRSA. Her MRI came back showing osteomyelitis within the third metatarsal shaft and head and base of the third and fourth proximal phalanx. She had extensive inflammatory changes throughout the soft tissue of the lateral forefoot. With an ill-defined fluid around the fourth metatarsal extending into the plantar and dorsal soft tissues 09/19/17; the patient is actually on oral Septra and Flagyl. She apparently refused IV vancomycin. She also saw [name] at my request who is planning her for a left BKA sometime in mid July. MRI showed osteomyelitis within the third metatarsal shaft and head and the basis of the third and fourth proximal phalanx. I believe there was felt to be possible septic arthritis involving the third MTP. 09/26/17; the patient went back to Dr. Megan Salon at my suggestion and is now receiving IV daptomycin. Her wound continues to look quite good making the decision to proceed with a transmetatarsal amputation although more difficult for the patient. I believe in my extensive discussions with her she has a good sense of the pros and cons of this. I don't NV the tuft decision she has to make. She has an appointment with [name] I believe in mid July and I previously spoken to him about this issue Has we had used 3 previous Dermagraft. Given the condition of the wound surface I went ahead and added the fourth one today area and I did this not fully realizing that she'll be traveling to [place] next week. I'm hopeful she can come  back in 2 weeks 10/21/17; Her same Dermagraft on for about 3-1/2 weeks. In spite of this the wound arrives looking quite  healthy. There is been a lot of healing dimensions are smaller. Looking at the square shaped wound she has now there is some undermining and some depth medially under the undermining although I cannot palpate any bone. No surrounding infection is obvious. She has difficult questions about how to look at this going forward vis--vis amputations versus continued medical therapy. To be truthful the wound is looks so healthy and it is continued to contract. Hard to justify foot surgery at this point although I still told her that I think it might come to that if we are not able to eradicate the underlying MRSA. She is still highly at risk and she understands this 11/06/17 on evaluation today patient appears to be doing better in regard to her foot ulcer. She's been tolerating the dressing changes without complication. Currently she is here for her Dermagraft #6. Her wound continues to make excellent progress at this point. She does not appear to have any evidence of infection which is good news. 11/13/17 on evaluation today patient appears to be doing excellent at this time. She is here for repeat Dermagraft application. This is #7. Overall her wound seems to be making great progress. 12/05/17; the patient arrives with the wound in much better condition than when I last saw this almost 6 weeks ago. She still has a small probing area in the left metatarsal head region on the lateral aspect of her foot. We applied her last Dermagraft today. Since the last time she is here she has what appears to a been a blood blister on the plantar aspect of left foot although I don't see this is threatening. There is also a thick raised tissue on the right mid metatarsal head region. This was not there I don't think the last time she was here 3 weeks ago. 12/12/17; the patient continues to have a small programming area in the left metatarsal head region on the lateral aspect of her foot which was the initial large surgical  wound. I applied her last Apligraf last week. I'm going to use Endoform starting today Unfortunately she has an excoriated area in the left mid foot and the right mid foot. The left midfoot looks like a blistered area this was not opened last week it certainly is open today. Using silver alginate on these areas. She promises me she is offloading this. 12/19/17; the small probing area in the left metatarsal head eyes think is shallower. In general her original wound looks better. We've been using Endoform. The area inferiorly that I think was trauma last week still requires debridement a lot of nonviable surface which I removed. She still has an open open area distally in her foot Similarly on the right foot there is tightly adherent surface debris which I removed. Still areas that don't look completely epithelialized. This is a small open area. We used silver alginate on these areas 12/26/2017; the patient did not have the supplies we ordered from last week including the Endoform. The original large wound on the left lateral foot looks healthy. She still has the undermining area that is largely unchanged from last week. She has the same heavily callused raised edged wounds on the right mid and left midfoot. Both of these requiring debridement. We have been using silver alginate on these areas 01/02/2018; there is still supply issues. We are going to try  to use Prisma but I am not sure she actually got it from what she is saying. She has a new open area on the lateral aspect of the left fourth toe [previous fifth ray amputation]. Still the one tunneling area over the fourth metatarsal head. The area is in the midfoot bilaterally still have thick callus around them. She is concerned about a raised swelling on the lateral aspect of the foot. However she is completely insensate 01/10/2018; we are using Prisma to the wounds on her bilateral feet. Surprisingly the tunneling area over the left fourth  metatarsal head that was part of her original surgery has closed down. She has a small open area remaining on the incision line. 2 open areas in the midfoot. 02/10/2018; the patient arrives back in clinic after a month hiatus. She was traveling to visit family in [place]. Is fairly clear she was not offloading the areas on her feet. The original wound over the left lateral foot at the level of metatarsal heads is reopened and probes medially by about a centimeter or 2. She notes that a week ago she had purulent drainage come out of an area on the left midfoot. Paradoxically the worst area is actually on the right foot is extensive with purulent drainage. We will use silver alginate today 02/17/2018; the patient has 3 wounds one over the left lateral foot. She still has a small area over the metatarsal heads which is the remnant of her original surgical wound. This has medial probing depth of roughly 1.4 cm somewhat better than last week. The area on the right foot is larger. We have been using silver alginate to all areas. The area on the right foot and left foot that we cultured last week showed both Klebsiella and Proteus. Both of these are quinolone sensitive. The patient put her's self on Bactrim and Flagyl that she had left hanging around from prior antibiotic usages. She was apparently on this last week when she arrived. I did not realize this. Unfortunately the Bactrim will not cover either 1 of these organisms. We will send in Cipro 500 twice daily for a week 03/04/2018; the patient has 2 wounds on the left foot one is the original wound which was a surgical wound for a deep DFU. At one point this had exposed bone. She still has an area over the fourth metatarsal head that probes about 1.4 cm although I think this is better than last week. I been using silver nitrate to try and promote tissue adherence and been using silver alginate here. She also has an area in the left midfoot. This has  some depth but a small linear wound. Still requiring debridement. On the right midfoot is a circular wound. A lot of thick callus around this area. We have been using silver alginate to all wound areas She is completed the ciprofloxacin I gave her 2 weeks ago. 03/11/2018; the patient continues to have 2 open areas on the left foot 1 of which was the original surgical wound for a deep DFU. Only a small probing area remains although this is not much different from last week we have been using silver alginate. The other area is on the midfoot this is smaller linear but still with some depth. We have been using silver alginate here as well On the right foot she has a small circular wound in the mid aspect. This is not much smaller than last time. We have been using silver alginate here as well  03/18/2018; she has 3 wounds on the left foot the original surgical wound, a very superficial wound in the mid aspect and then finally the area in the mid plantar foot. She arrives in today with a very concerning area in the wound in the mid plantar foot which is her most proximal wound. There is undermining here of roughly 1-1/2 cm superiorly. Serosanguineous drainage. She tells me she had some pain on for over the weekend that shot up her foot into her thigh and she tells me that she had a nodule in the groin area. She has the single wound in the right foot. We are using endoform to both wound areas 03/24/2018; the patient arrives with the original surgical wound in the area on the left midfoot about the same as last week. There is a collection of fluid under the surface of the skin extending from the surgical wound towards the midfoot although it does not reach the midfoot wound. The area on the right foot is about the same. Cultures from last week of the left midfoot wound showed abundant Klebsiella abundant Enterococcus faecalis and moderate methicillin resistant staph I gave her Levaquin but this would  have only covered the Klebsiella. She will need linezolid 04/01/2018; she is taking linezolid but for the first few days only took 1 a day. I have advised her to finish this at twice daily dosing. In any case all of her wounds are a lot better especially on the left foot. The original surgical wound is closed. The area on the left midfoot considerably smaller. The area on the right foot also smaller. 04/08/2018; her original surgical wound/osteomyelitis on the left foot remains closed. She has area on the left foot that is in the midfoot area but she had some streaking towards this. This is not connected with her original wound at least not visually. Small wound on the right midfoot appears somewhat smaller. 04/15/18; both wounds looks better. Original wound is better left midfoot. Using silver alginate 1/21; patient states she uses saltwater soak in, stones or remove callus from around her wounds. She is also concerned about a blood blister she had on the left foot but it simply resolved on its own. We've been using silver alginate 1/28; the patient arrives today with the same streaking area from her metatarsals laterally [the site of her original surgical wound] down to the middle of her foot. There is some drainage in the subcutaneous area here. This concerns me that there is actually continued ongoing infection in the metatarsals probably the fourth and third. This fixates an MRI of the foot without contrast [chronic renal failure] The wound in the mid part of the foot is small but I wonder whether this area actually connects with the more distal foot. The area on the right midfoot is probably about the same. Callus thick skin around the small wound which I removed with a curette we have been using silver alginate on both wound areas 2/4; culture I did of the draining site on the left foot last time grew methicillin sensitive staph aureus. MRI of the left foot showed interval resolution of the  findings surrounding the third metatarsal joint on the prior study consistent with treated osteomyelitis. Chronic soft tissue ulceration in the plantar and lateral aspect of the forefoot without residual focal fluid collection. No evidence of recurrent osteomyelitis. Noted to have the previous amputation of the distal first phalanx and fifth ray MRI of the right foot showed no evidence of  osteomyelitis I am going to treat the patient with a prolonged course of antibiotics directed against MSSA in the left foot 2/11; patient continues on cephalexin. She tells me she had nausea and vomiting over the weekend and missed 2 days. In general her foot looks much the same. She has a small open area just below the left fourth metatarsal head. A linear area in the left midfoot. Some discoloration extending from the inferior part of this into the left lateral foot although this appears to be superficial. She has a small area on the right midfoot which generally looks smaller after debridement 2/18; the patient is completing his cephalexin and has another 2 days. She continues to have open areas on the left and right foot. 2/25; she is now off antibiotics. The area on the left foot at the site of her original surgical wound has closed yet again. She still has open areas in the mid part of her foot however these appear smaller. The area on the right mid foot looks about the same. We have been using silver alginate She tells me she had a serious hypoglycemic spell at home. She had to have EMS called and get IV dextrose 3/3; disappointing on the left lateral foot large area of necrotic tissue surrounding the linear area. This appears to track up towards the same original surgical wound. Required extensive debridement. The area on the right plantar foot is not a lot better also using silver 3/12; the culture I did last time showed abundant enterococcus. I have prescribed Augmentin, should cover any unrecognized  anaerobes as well. In addition there were a few MRSA and Serratia that would not be well covered although I did not want to give her multiple antibiotics. She comes in today with a new wound in the right midfoot this is not connected with the original wound over her MTP a lot of thick callus tissue around both wounds but once again she said she is not walking on these areas 3/17-Patient comes in for follow-up on the bilateral plantar wounds, the right midfoot and the left plantar wound. Both these are heavily callused surrounding the wounds. We are continuing to use silver alginate, she is compliant with offloading and states she uses a wheelchair fairly often at home 3/24; both wound areas have thick callus. However things actually look quite a bit better here for the majority of her left foot and the right foot. 3/31; patient continues to have thick callused somewhat irritated looking tissue around the wounds which individually are fairly superficial. There is no evidence of surrounding infection. We have been using silver alginate however I change that to [facility] today 4/17; patient returns to clinic after having a scare with Covid she tested negative in her primary doctor's office. She has been using Hydrofera Blue. She does not have an open area on the right foot. On the left foot she has a small open area with the mid area not completely viable. She showed me pictures of what looks like a hemorrhagic blister from several days ago but that seems to have healed over this was on the lateral left foot 4/21; patient comes in to clinic with both her wounds on her feet closed. However over the weekend she started having pain in her right foot and leg up into the thigh. She felt as though she was running a low-grade fever but did not take her temperature. She took a doxycycline that she had leftover and yesterday a single Septra  and metronidazole. She thinks things feel somewhat  better. 4/28; duplex ultrasound I ordered last week was negative for DVT or superficial thrombophlebitis. She is completed the doxycycline I gave her. States she is still having a lot of pain in the right calf and right ankle which is no better than last week. She cannot sleep. She also states she has a temperature of up to 101, coughing and complaining of visual loss in her bilateral eyes. Apparently she was tested for Covid 2 weeks ago at [facility] and that was negative. Readmission: 09/03/18 patient presents back for reevaluation after having been evaluated at the end of April regarding erythema and swelling of her right lower extremity. Subsequently she ended up going to the hospital on 07/29/18 and was admitted not to be discharged until 08/08/18. Unfortunately it was noted during the time that she was in the hospital that she did have methicillin-resistant Staphylococcus aureus as the infection noted at the site. It was also determined that she did have osteomyelitis which appears to be fairly significant. She was treated with vancomycin and in fact is still on IV vancomycin at dialysis currently. This is actually slated to continue until 09/12/18 at least which will be the completion of the six weeks of therapy. Nonetheless based on what I'm seeing at this point I'm not sure she will be anywhere near ready to discontinue antibiotics at that time. Since she was released from the hospital she was seen by [name] who is her podiatrist on 08/27/18. His note specifically states that he is recommended that the patient needs of one knee amputation on the right as she has a life-threatening situation that can lead quickly to sepsis. The patient advised she would like to try to save her leg to which [name] apparently told her that this was against all medical advice. She also want to discontinue the Wound VAC which had been initiated due to the fact that she wasn't pleased with how the wound was looking  and subsequently she wanted to pursue applying Medihoney at that time. He stated that he did not believe that the right lower extremity was salvageable and that the patient understood but would still like to attempt hyperbaric option therapy if it could be of any benefit. She was therefore referred back to [place] for further evaluation. He plans to see her back next week. Upon inspection today patient has a significant amount purulent drainage noted from the wound at this point. The bone in the distal portion of her foot also appears to be extremely necrotic and spongy. When I push down on the bone it bubbles and seeps purulent drainage from deeper in the end of the foot. I do not think that this is likely going to heal very well at all and less aggressive surgical debridement were undertaken more than what I believe we can likely do here in our office. 09/12/2018; I have not seen this patient since the most recent hospitalization although she was in our clinic last week. I have reviewed some of her records from a complex hospitalization. 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 There is situation was complicated by MRSA bacteremia and acute on chronic renal failure now on dialysis. She is receiving vancomycin at dialysis. We started her on Dakin's wet-to- dry last week she is changing this daily. There is still purulent drainage coming out of her foot. Although she is apparently "agreeable" to a below-knee amputation which  is been suggested by multiple clinicians she wants this to be done in [place]. She apparently has a telehealth visit with that provider sometime in late [place] 6/24. I have told her I think this is probably too long. Nevertheless I could not convince her to allow a local doctor to perform BKA. 09/19/2018; the patient has a large necrotic area on the right anterior foot. She has had previous transmetatarsal amputations. Culture I did  last week showed MRSA nothing else she is on vancomycin at dialysis. She has continued leaking purulent drainage out of the distal part of the large circular wound on the right anterior foot. She apparently went to see Dr. 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 Somehow that translated into her being referred to plastic surgery for debridement of the area. I gather she basically refused amputation although I do not have a copy of Dr. Berenice Primas notes. The patient really wants to have a trial of hyperbaric oxygen. I agreed with initial assessment in this clinic that this was probably too far along to benefit however if she is going to have plastic surgery I think she would benefit from ancillary hyperbaric oxygen. The issue here is that the patient has benefited as maximally as any patient I have ever seen from hyperbaric oxygen therapy. Most recently she had exposed bone on the lateral part of her left foot after a surgical procedure and that actually has closed. She has eschared areas in both heels but no open area. She is remained systemically well. I am not optimistic that anything can be done about this but the patient is very clear that she wants an attempt. The attempt would include a wound VAC further debridements and hyperbaric oxygen along with IV antibiotics. 6/26; I put her in for a trial of hyperbaric oxygen only because of the dramatic response she has had with wounds on her left midfoot earlier this year which was a surgical wound that went straight to her bone over the metatarsal heads and also remotely the left third toe. We will see if we can get this through our review process and insurance. She arrives in clinic with again purulent material pouring out of necrotic bone on the top of the foot distally. There is also some concerning erythema on the front of the leg that we marked. It is bit difficult to tell how tender this is because of neuropathy. I  note from infectious disease that she had her vancomycin extended. 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 She had the wound VAC on for part of the week. The rest of the time she is putting various things on this including Medihoney, "ionized water" silver sorb gel etc. 7/7; follow-up along with HBO. She is still on vancomycin at dialysis. She has a large open area on the dorsal right foot and a small dark eschar area on her heel. There is a lot less erythema in the area and a lot less tenderness. From an infection point of view I think this is better. She still has a lot of necrosis in the remaining right forefoot [previous TMA] we are still using the wound VAC in this area 7/16; follow-up along with HBO. I put her on linezolid after she finished her vancomycin. We started this last Friday I gave her 2 weeks worth. I had the expectation that she would be operatively debrided by Dr. Marla Roe but that still has not happened yet. Patient phoned the  office this week. She arrives for review today after HBO. The distal part of this wound is completely necrotic. Nonviable pieces of tendon bone was still purulent drainage. Also concerning that she has black eschar over the heel that is expanding. I think this may be indicative of infection in this area as well. She has less erythema and warmth in the ankle and calf but still an abnormal exam 7/21 follow-up along with HBO. I will renew her linezolid after checking a CBC with differential monitoring her blood counts especially her platelets. She was supposed to have surgery yesterday but if I am reading things correctly this was canceled after her blood sugar was found to be over 500. I thought Dr. Marla Roe who called me said that they were sending her to the ER but the patient states that was not the case. 7/28. Follow-up along with HBO. She is on linezolid I still do not have any lab work from dialysis even though  I called last week. The patient is concerned about an area on her left lateral foot about the level of the base of her fifth metatarsal. I did not really see anything that ominous here however this patient is in [place] ability to point out problems that she is sensing and she has been accurate in the past Finally she received a call from Dr. Marla Roe who is referring her to another orthopedic surgeon stating that she is too booked up to take her to the operating room now. Was still using a wound VAC on the foot 8/3 -Follow-up after HBO, she is got another week of linezolid, she is to call ID for an appointment, x-rays of both feet were reviewed, the left foot x-ray with third MTP joint osteo- Right foot x-ray widespread osteo-in the right midfoot Right ankle x-ray does not show any active evidence of infection 8/11-Patient is seen after HBO, the wounds on the right foot appear to be about the same, the heel wound had some necrotic base over tendon that was debrided with a curette 8/21; patient is seen after HBO. The patient's wound on her dorsal foot actually looks reasonably good and there is substantial amount of epithelialization however the open area distally still has a lot of necrotic debris partially bone. I cannot really get a good sense of just how deep this probes under the foot. She has been pressuring me this week to order medical maggots through a company in [place] for her. The problem I have is there is not a defined wound area here. On the positive side there is no purulence. She has been to see infectious disease she is still on Septra DS although I have not had a chance to review their notes 8/28; patient is seen in conjunction with HBO. The wounds on her foot continued to improve including the right dorsal foot substantially the, the distal part of this wound and the area on the right heel. We have been using a wound VAC over this chronically. She is still on trimethoprim  as directed by infectious disease 9/4; patient is seen in conjunction with HBO. Right dorsal foot wound substantially anteriorly is better however she continues to have a deep wound in the distal part of this that is not responding. We have been using silver collagen under border foam Area on the right plantar medial heel seems better. We have been using Hydrofera Blue 12/12/18 on evaluation today patient appears to be doing about the same with regard to her wound based  on prior measurements. She does have some necrotic tissue noted on the lateral aspect of the wound that is going require a little bit of sharp debridement today. This includes what appears to be potentially either severely necrotic bone or tendon. Nonetheless other than that she does not appear to have any severe infection which is good news 9/18; it is been 2 weeks since I saw this wound. She is tolerating HBO well. Continued dramatic improvement in the area on the right dorsal foot. She still has a small wound on the heel that we have been using Hydrofera Blue. She continues with a wound VAC 9/24; patient has to be seen emergently today with a swelling on her right lateral lower leg. She says that she told Dr. Evette Doffing about this and also myself on a couple of occasions but I really have no recollection of this. She is not systemically unwell and her wound really looked good the last time I saw this. She showed this to providers at dialysis and she was able to verify that she was started on cephalexin today for 5 doses at dialysis. She dialyzes on Tuesday Thursday and Saturday. 10/2; patient is seen in conjunction with HBO. The area that is draining on the right anterior medial tibia is more extensive. Copious amounts of serosanguineous drainage with some purulence. We are still using the wound VAC on the original wound then it is stable. Culture I did of the original IandD showed MRSA I contacted dialysis she is now on vancomycin  with dialysis treatments. I asked them to run a month 10/9; patient seen in conjunction with HBO. She had a new spontaneous open area just above the wound on the right medial tibia ankle. More swelling on the right medial tibia. Her wound on the foot looks about the same perhaps slightly better. There is no warmth spreading up her leg but no obvious erythema. her MRI of the foot and ankle and distal tib-fib is not booked for next Friday I discussed this with her in great detail over multiple days. it is likely she has spreading infection upper leg at least involving the distal 25% above the ankle. She knows that if I refer her to orthopedics for infectious disease they are going to recommend amputation and indeed I am not against this myself. We had a good trial at trying to heal the foot which is what she wanted along with antibiotics debridement and HBO however she clearly has spreading infection [probably staph aureus/MRSA]. Nevertheless she once again tells me she wants to wait the left of the MRI. She still makes comments about having her amputation done in [place]. 10/19; arrives today with significant swelling on the lateral right leg. Last culture I did showed Klebsiella. Multidrug- resistant. Cipro was intermediate sensitivity and that is what I have her on pending her MRI which apparently is going to be done on Thursday this week although this seems to be moving back and forth. She is not systemically unwell. We are using silver alginate on her major wound area on the right medial foot and the draining areas on the right lateral lower leg 10/26; MRI showed extensive abscess in the anterior compartment of the right leg also widespread osteomyelitis involving osseous structures of the midfoot and portions of the hindfoot. Also suspicion for osteomyelitis anterior aspect of the distal medial malleolus. Culture I did of the purulence once again showed a multidrug-resistant Klebsiella. I  have been in contact with nephrology late last week and  she has been started on cefepime at dialysis to replace the vancomycin We sent a copy of her MRI report to Dr. Geroge Baseman in [place] who is an orthopedic surgeon. The patient takes great stock in his opinion on this. She says she will go to [place] to have her leg amputated if Dr. Geroge Baseman does not feel there is any salvage options. 11/2; she still is not talk to her orthopedic surgeon in [place]. Apparently he will call her at [phone] this afternoon. The quality of this is she has not allowed me to refer her anywhere. She has been told over and over that she needs this amputated but has not agreed to be referred. She tells me her blood sugar was 600 last night but she has not been febrile. 11/9; she never did got a call from the orthopedic surgeon in [place] therefore that is off the radar. We have arranged to get her see orthopedic surgery at [facility]. She still has a lot of draining purulence coming out of the new abscess in her right leg although that probably came from the osteomyelitis in her right foot and heel. Meanwhile the original wound on the right foot looks very healthy. Continued improvement. The issue is that the last MRI showed osteomyelitis in her right foot extensively she now has an abscess in the right anterior lower leg. There is nobody in [place] who will offer this woman anything but an amputation and to be honest that is probably what she needs. I think she still wants to talk about limb salvage although at this point I just do not see that. She has completed her vancomycin at dialysis which was for the original staph aureus she is still on cefepime for the more recent Klebsiella. She has had a long course of both of these antibiotics which should have benefited the osteomyelitis on the right foot as well as the abscess. 11/16; apparently [place] elective surgery is shut down because of COVID-19 pandemic.  I have reached out to some contacts at [facility] to see if we can get her an orthopedic appointment there. I am concerned about continually leaving this but for the moment everything is static. In fact her original large wound on this foot is closing down. It is the abscess on the right anterior leg that continues to drain purulent serosanguineous material. She is not currently on any antibiotics however she had a prolonged course of vancomycin [1 month] as well as cefepime for a month 02/24/2019 on evaluation today patient appears to be doing better than the last time I saw her. This is not a patient that I typically see. With that being said I am covering for Dr. Dellia Nims this week and again compared to when I last saw her overall the wounds in particular seem to be doing significantly better which is good news. With that being said the patient tells me several disconcerting things. She has not been able to get in to see anyone for potential debridement in regard to her leg wounds although she tells me that she does not think it is necessary any longer because she is taking care of that herself. She noticed a string coming out of the lower wound on her leg over the last week. The patient states that she subsequently decided that we must of pack something in there and started pulling the string out and as it kept coming and coming she realized this was likely her tendon. With that being said she continued to remove  as much of this as she could. She then I subsequently proceeded to using tubes of antibiotic ointment which she will stick down into the wound and then scored as much as she can until she sees it coming out of the other wound opening. She states that in doing this she is actually made things better and there is less redness and irritation. With regard to her foot wound she does have some necrotic tendon and tissue noted in one small corner but again the actual wound itself seems to be doing  better with good granulation in general compared to my last evaluation. 12/7; continued improvement in the wound on the substantial part of the right medial foot. Still a necrotic area inferiorly that required debridement but the rest of this looks very healthy and is contracting. She has 2 wounds on the right lateral leg which were her original drainage sites from her abscess but all of this looks a lot better as well. She has been using silver alginate after putting antibiotic biotic ointment in one wound and watching it come out the other. I have talked to her in some detail today. I had given her names of orthopedic surgeons at [facility] for second opinion on what to do about the right leg. I do not think the patient never called them. She has not been able to get a hold of the orthopedic surgeon in [place] that she had put a lot of faith in as being somebody would give her an opinion that she would trust. I talked to her today and said even if I could get her in to another orthopedic surgeon about the leg which she accept an amputation and she said she would not therefore I am not going to press this issue for the moment 12/14; continued improvement in his substantial wound on the right medial foot. There is still a necrotic area inferiorly with tightly adherent necrotic debris which I have been working on debriding each time she is here. She does not have an orthopedic appointment. Since last time she was here I looked over her cultures which were essentially MRSA on the foot wound and gram- negative rods in the abscess on the anterior leg. 12/21; continued improvement in the area on the right medial foot. She is not up on this much and that is probably a good thing since I do not know it could support continuous ambulation. She has a small area on the right lateral leg which were remanence of the IandD's I did because of the abscess. I think she should probably have  prophylactic antibiotics I am going to have to look this over to see if we can make an intelligent decision here. In the meantime her major wound is come down nicely. Necrotic area inferiorly is still there but looks a lot better 04/06/2019; she has had some improvement in the overall surface area on the right medial foot somewhat narrowedr both but somewhat longer. The areas on the right lateral leg which were initial IandD sites are superficial. Nothing is present on the right heel. We are using silver alginate to the wound areas 1/18; right medial foot somewhat smaller. Still a deep probing area in the most distal recess of the wound. She has nothing open on the right leg. She has a new wound on the plantar aspect of her left fourth toe which may have come from just pulling skin. The patient using Medihoney on the wound on her foot under silver alginate.  I cannot discourage her from this 2/1; 2-week follow-up using silver alginate on the right foot and her left fourth toe. The area on the right dorsal foot is contracted although there is still the deep area in the most distal part of the wound but still has some probing depth. No overt infection 2/15; 2-week follow-up. She continues to have improvement in the surface area on the dorsal right foot. Even the tunneling area from last time is almost closed. The area that was on the plantar part of her left fourth toe over the PIP is indeed closed 3/1; 2-week follow-up. Continued improvement in surface area. The original divot that we have been debriding inferiorly I think has full epithelialization although the epithelialization is gone down into the wound with probably 4 mm of depth. Even under intense illumination I am unable to see anything open here. The remanence of the wound in this area actually look quite healthy. We have been using silver alginate 3/15; 2-week follow-up. Unfortunately not as good today. She has a comma shaped wound on the  dorsal foot however the upper part of this is larger. Under illumination debris on the surface She also tells [place] that she was on her right leg 2 times in the last couple of weeks mostly to reach up for things above her head etc. She felt a sharp pain in the right leg which she thinks is somewhere from the ankle to the knee. The patient has neuropathy and is really uncertain. She cannot feel her foot so she does not think it was coming from there 3/29; 2-week follow-up. Her wound measures smaller. Surface of the wound appears reasonable. She is using silver alginate with underlying Medihoney. She has home health. X-rays I did of her tib-fib last time were negative although it did show arterial calcification Electronic Signature(s) Signed: 06/29/2019 5:31:36 PM By: Linton Ham MD Entered By: Linton Ham on 06/29/2019 14:07:03 -------------------------------------------------------------------------------- Physical Exam Details Patient Name: Date of Service: [name] 06/29/2019 12:45 PM Medical Record Number:[id number] Patient Account Number: [id number] Date of Birth/Sex: Treating RN: 05-09-1970 (49 y.o. Nancy Fetter Primary Care Provider: [name] Other Clinician: Referring Provider: Treating Provider/Extender:Rockne Dearinger, [name], [name] in Treatment: 62 Constitutional Patient is hypertensive.. Pulse regular and within target range for patient.Marland Kitchen Respirations regular, non-labored and within target range.. Temperature is normal and within the target range for the patient.Marland Kitchen Appears in no distress. Respiratory work of breathing is normal. Cardiovascular Pedal pulses are palpable on the right. Notes Wound exam; the wound is smaller. No debridement was required. The areas on the dorsal medial right foot Electronic Signature(s) Signed: 06/29/2019 5:31:36 PM By: Linton Ham MD Entered By: Linton Ham on 06/29/2019  14:07:55 -------------------------------------------------------------------------------- Physician Orders Details Patient Name: Date of Service: [name] 06/29/2019 12:45 PM Medical Record Number:[id number] Patient Account Number: [id number] Date of Birth/Sex: Treating RN: [birth year] (49 y.o. [name] Primary Care Provider: [name] Other Clinician: Referring Provider: Treating Provider/Extender:Rehaan Viloria, [name], [name] in Treatment: 81 Verbal / Phone Orders: No Diagnosis Coding ICD-10 Coding Code Description [phone] Other chronic osteomyelitis, right ankle and foot L97.514 Non-pressure chronic ulcer of other part of right foot with necrosis of bone E10.621 Type 1 diabetes mellitus with foot ulcer Follow-up Appointments Return Appointment in 1 week. Dressing Change Frequency Wound #43 Right,Medial Foot Change dressing three times week. Wound Cleansing Clean wound with Wound Cleanser - Anasept to all wounds Primary Wound Dressing Wound #43 Right,Medial Foot Calcium Alginate with Silver  Other: - may use thin layer of medihoney under alginate Secondary Dressing Wound #43 Right,Medial Foot Kerlix/Rolled [name] Dry [name] Edema Control Elevate legs to the level of the heart or above for 30 minutes daily and/or when sitting, a frequency of: - throughout the day [facility] for wound care. - Interim Electronic Signature(s) Signed: 06/29/2019 5:31:36 PM By: Linton Ham MD Signed: 06/29/2019 5:31:53 PM By: Baruch Gouty RN, BSN Entered By: Baruch Gouty on 06/29/2019 13:43:09 -------------------------------------------------------------------------------- Problem List Details Patient Name: Date of Service: [name] 06/29/2019 12:45 PM Medical Record Number:[id number] Patient Account Number: [id number] Date of Birth/Sex: Treating RN: December 12, 1970 (49 y.o. [name] Primary Care  Provider: [name] Other Clinician: Referring Provider: Treating Provider/Extender:Jammy Plotkin, [name], [name] in Treatment: 64 Active Problems ICD-10 Evaluated Encounter Code Description Active Date Today Diagnosis M86.671 Other chronic osteomyelitis, right ankle and foot 09/03/2018 No Yes L97.514 Non-pressure chronic ulcer of other part of right foot 09/03/2018 No Yes with necrosis of bone E10.621 Type 1 diabetes mellitus with foot ulcer 09/24/2018 No Yes Inactive Problems ICD-10 Code Description Active Date Inactive Date L97.521 Non-pressure chronic ulcer of other part of left foot limited to 04/20/2019 04/20/2019 breakdown of skin L97.812 Non-pressure chronic ulcer of other part of right lower leg with 02/24/2019 02/24/2019 fat layer exposed Resolved Problems ICD-10 Code Description Active Date Resolved Date L02.415 Cutaneous abscess of right lower limb 12/25/2018 12/25/2018 Electronic Signature(s) Signed: 06/29/2019 5:31:36 PM By: Linton Ham MD Entered By: Linton Ham on 06/29/2019 14:05:41 -------------------------------------------------------------------------------- Progress Note Details Patient Name: Date of Service: [name] 06/29/2019 12:45 PM Medical Record Number:[id number] Patient Account Number: [id number] Date of Birth/Sex: Treating RN: Oct 12, 1970 (49 y.o. Nancy Fetter Primary Care Provider: [name] Other Clinician: Referring Provider: Treating Provider/Extender:Ronith Berti, [name], [name] in Treatment: 35 Subjective History of Present Illness (HPI) 49 year old diabetic who is known to have type 1 diabetes which is poorly controlled last hemoglobin A1c was 11%. She comes in with a ulcerated area on the left lateral foot which has been there for over 6 months. Was recently she has been treated by [name] of podiatry who saw her last on 05/28/2016. Review of his notes revealed that the patient had incision and  drainage with placement of antibiotic beads to the left foot on 04/11/2016 for possible osteomyelitis of the cuboid bone. Over the last year she's had a history of amputation of the left fifth toe and a femoropopliteal popliteal bypass graft somewhere in April 2017. 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 His note [name] mentions that the patient has been referred to me for further wound care and possibly great candidate for hyperbaric oxygen therapy due to recurrent osteomyelitis. However we do not have any x-rays of biopsy reports confirming this. He has been on several antibiotics including Bactrim and most recently is on doxycycline for an MRSA. I understand, the patient was not a candidate for IV antibiotics as she has had previous PICC lines which resulted in blood clots in both arms. There was a x-ray report dated 04/04/2016 on [name] notes which showed evidence of fifth ray resection left foot with osteolytic changes noted to the fourth metatarsal and cuboid bone on the left. 06/13/2016 -- had a left foot x-ray which showed no acute fracture or dislocation and no definite radiographic evidence of osteomyelitis. Advanced osteopenia was seen. 06/20/2016 -- she has noticed a new wound on the right plantar foot in the  region where she had a callus before. 06/27/16- the patient did have her x-ray of the right foot which showed no findings to suggest osteomyelitis. She saw her endocrinologist, [name], yesterday. Her A1c in January was 11. He also indicates mismanagement and noncompliance regarding her diabetes. She is currently on Bactrim for a lip infection. She is complaining of nausea, vomiting and diarrhea. She is unable to articulate the exact orders or dosing of the Bactrim; it is unclear when she will complete this. 07/04/2016 -- results from [facility] of ABIs with ankle waveforms were noted from 02/14/2016. The examination done on 06/27/2015 showed  noncompressible ABIs with the right being 1.45 and the left being 1.33. The present examination showed a right ABI of 1.19 on the left of 1.33. The conclusion was that right normal ABI in the lower extremity at rest however compared to previous study which was noncompressible ABI may be falsely elevated side suggesting medial calcification. The left ABI suggested medial calcification. 08/01/2016 -- the patient had more redness and pain on her right foot and did not get to come to see as noted she see her PCP or go to the ER and decided to take some leftover metronidazole which she had at home. As usual, the patient does report she feels and is rather noncompliant. 08/08/2016 -- -- x-ray of the right foot -- FINDINGS:Transmetatarsal amputation is noted. No bony destruction is noted to suggest osteomyelitis. IMPRESSION: No evidence of osteomyelitis. Postsurgical changes are seen. MRI would be more sensitive for possible bony changes. Culture has grown Serratia Marcescens -- sensitive to Bactrim, ciprofloxacin, ceftazidime she was seen by [name] on 08/06/2016. He did not find any exposed bone, muscle, tendon, ligament or joint. There was no malodor and he did a excisional debridement in the office. ============ Old notes: 50 year old patient who is known to the wound clinic for a while had been away from the wound clinic since 09/01/2014. Over the last several months she has been admitted to various hospitals including [place] at [facility]. She was treated for a right metatarsal osteomyelitis with a transmetatarsal amputation and this was done about 2 months ago. He has a small ulcerated area on the right heel and she continues to have an ulcerated area on the left plantar aspect of the foot. The patient was recently admitted to the [facility] hospital group between 7/12 and 10/18/2014. she was given 3 weeks of IV vancomycin and was to follow-up with her surgeons at [facility] and  also took oral vancomycin for C. difficile colitis. 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 MRI of the left foot done on 10/14/2014 showed no abscess or osteomyelitis. 04/27/15; this is a patient we know from previous stays in the wound care center. She is a type I diabetic I am not sure of her control currently. Since the last time I saw her she is had a right transmetatarsal amputation and has no wounds on her right foot and has no open wounds. She is been followed at the wound care center at [facility] in [place]. She comes today with the desire to undergo hyperbaric treatment locally. Apparently one of her wound care providers in [place] has suggested hyperbarics. This is in response to an MRI from 04/18/15 that showed increased marrow  signal and loss of the proximal fifth metatarsal cortex evidence of osteomyelitis with likely early osteomyelitis in the cuboid bone as well. She has a large wound over the base of the fifth metatarsal. She also has a eschar over her the tips of her toes on 1,3 and 5. She does not have peripheral pulses and apparently is going for an angiogram tomorrow which seems reasonable. After this she is going to infectious disease at [facility]. They have been using Medihoney to the large wound on the lateral aspect of the left foot to. The patient has known Charcot deformity from diabetic neuropathy. She also has known diabetic PAD. Surprisingly I can't see that she has had any recent antibiotics, the patient states the last antibiotic she had was at the end of November for 10 days. I think this was in response to culture that showed group G strep although I'm not exactly sure  where the culture was from. She is also had arterial studies on 03/29/15. This showed a right ABI of 1.4 that was noncompressible. Her left ABI was 0.73. 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 It was not felt that arterial inflow was adequate for healing of a foot ulcer. Her Doppler waveforms looked monophasic ===== READMISSION 02/28/17; this is in an now 49 year old woman we've had at several different occasions in this clinic. 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 She has a remote ex-smoker. She was last seen in this clinic by Dr. Con Memos I think in May. More recently she is been followed by her podiatrist [name] an infectious disease Dr. Megan Salon. She has 2 open wounds the major one is over the right first metatarsal head she also has a wound on the left plantar foot. an MRI of the right foot on 01/01/17 showed a soft tissue ulcer along the plantar aspect of the first metatarsal base consistent with osteomyelitis of the first metatarsal stump. Dr. 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 According to the patient this is been open for slightly over a month. She has been on a combination of Cipro 500 twice a day, Zyvox 600 twice a day and Flagyl 500 3 times a day for over a month now as directed by Dr. Megan Salon. cultures of the right foot earlier this year showed MRSA in January and Serratia in May. January also had a few viridans strep. Recent x-rays of both feet were done and [name] office and I don't have these reports. The patient has known PAD and has a history of aleft femoropopliteal bypass in April 2017. 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 Dwyane Dee. In spite of this the last hemoglobin A1c I can see is 10.1 on  01/01/2017. She is being referred by 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 She is been using Medihoney to this area and also an area on the left midfoot. She is using healing sandals bilaterally. ABIs in this clinic at the left posterior tibial was 1.1 noncompressible on the right READMISSION Non invasive vascular NOVANT 5/18 Aftercare following surgery of the circulatory system Procedure Note - Interface, External Ris In - 08/13/2016 11:05 AM EDT Procedure: Examination  consists of physiologic resting arterial pressures of the brachial and ankle arteries bilaterally with continuous wave Doppler waveform analysis. Previous: Previous exam performed on 02/14/16 demonstrated ABIs of Rt = 1.19 and Lt = 1.33. Right: ABI = non-compressible PT, 1.47 DP. S/P transmet amputation. Left: ABI = 1.52, 2nd digit pressure = 87 mmHg Conclusions: Right: ABI (>1.3) may be falsely elevated, suggesting medial calcification. Left: ABI (>1.3) may be falsely elevated, suggesting medial calcification The patient is a now 49 year old type I diabetic is had multiple issues her graded to chronic diabetic foot ulcers. 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 We had her in the clinic lastin November. At that point she had wounds on her bilateral feet.she had wanted to try hyperbarics however the healogics review process denied her because she hadn't followed up with her vascular surgeon for her left femoropopliteal bypass. The bypass was done by Dr. Raul Del at [facility]. We made her a follow-up with Dr. Raul Del however she did not keep the appointment and therefore she was not approved The patient shows me a small wound on her left fourth metatarsal head on her phone. 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 She was admitted acutely ill with a temperature of 103. She was started on broad-spectrum vancomycin and cefepime. On 05/06/17 she was taken to the OR by [name] her podiatric surgeon for an incision and drainage irrigation of the left foot wound. Cultures from this surgery revealed group be strep and anaerobes. she was seen by Dr.Xu of orthopedic surgery and scheduled for a below-knee amputation which she u refused. Ultimately she was discharged on Levaquin and Flagyl for one month. MRI 05/05/17 done while she was in the hospital showed abscess adjacent to the fourth metatarsal head and neck small abscess around the fourth flexor tendon. Inflammatory phlegmon and gas in the soft tissues along the lateral aspect of the fourth phalanx. Findings worrisome for osteomyelitis involving the fourth proximal and middle phalanx and also the third and fourth metatarsals. Finally the patient had actually shortly before this followed up with Dr. Raul Del at no time on 04/29/17. He felt that her left femoropopliteal bypass was patent he felt that her left-sided toe pressures more than adequate for healing a wound on the left foot. This was before her acute presentation. 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 05/28/17; she is started hyperbarics. The patient tells me that for some reason she was not actually on Levaquin but I think on ciprofloxacin. She was on Flagyl. She only started her Levaquin yesterday due to some difficulty with the pharmacy and perhaps her sister picking it up. She has an appointment with [name] tomorrow and with infectious disease early next week. She has no new complaints 06/06/17; the patient continues in hyperbarics. She saw [name] on 05/29/17 who is her podiatric surgeon. He is elected for a transmetatarsal  amputation on 06/27/17. I'm not sure at what level he plans to do this amputation. The patient is unaware ooShe also saw Dr. Megan Salon of infectious disease who elected to continue her on current antibiotics I think this is ciprofloxacin and Flagyl. I'll need to clarify with her tomorrow if she actually has this. We're using silver alginate to the actual wound. Necrotic surface today with material under the flap  of her foot. ooOriginal MRI showed abscesses as well as osteomyelitis of the proximal and middle fourth phalanx and the third and fourth metatarsal heads 06/11/17; patient continues in hyperbarics and continues on oral antibiotics. She is doing well. The wound looks better. The necrotic part of this under the flap in her superior foot also looks better. she is been to see [name]. I haven't had a chance to look at his note. Apparently he has put the transmetatarsal amputation on hold her request it is still planning to take her to the OR for debridement and product application ACEL. I'll see if I can find his note. I'll therefore leave product ordering/requests to [name] for now. I was going to look at Dermagraft 06/18/17-she is here in follow-up evaluation for bilateral foot wounds. She continues with hyperbaric therapy. She states she has been applying manuka honey to the right plantar foot and alternate manuka honey and silver alginate to the left foot, despite our orders. We will continue with same treatment plan and she will follo up next week. 06/25/17; I have reviewed [name] last note from 3/11. She has operative debridement in 2 days' time. By review his note apparently they're going to place there is skin over the majority of this wound which is a good choice. She has a small satellite area at the most proximal part of this wound on the left plantar foot. The area on the right plantar foot we've been using silver alginate and it is close to healing. 07/02/17; unfortunately the  patient was not easily approved for [name] proposed surgery. I'm not completely certain what the issue is. She has been using silver alginate to the wound she has completed a first course of hyperbarics. She is still on Levaquin and Flagyl. I have really lost track of the time course here.I suspect she should have another week to 2 of antibiotics. I'll need to see if she is followed up with infectious disease Dr. Megan Salon 07/09/17; the patient is followed up with Dr. Megan Salon. 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 She continues on Levaquin and metronidazole continuing both of these for now I think she is been on fr about 6 weeks. She still has some drainage but no pain. No fever. Her had been plans for her to go to the OR for operative debridement with her podiatrist [name], I am not exactly sure where that is. I'll probably slip a note to [name] today. I note that she follows with Dr. Dwyane Dee of endocrinology. We have her recertified for hyperbaric oxygen. I have not heard about Dermagraft however I'll see if [name] is planning a skin substitute as well 07/16/17; the patient tells me she is just about out of [place]. I'll need to check Dr. Hale Bogus last notes on this. She states she has plenty of Flagyl however. She comes in today complaining of pain in the right lateral foot which she said lasted for about a day. The wound on the right foot is actually much more medially. She also tells me that the [facility] cost a lot of pain in the left foot wound and she turned back to silver alginate. Finally Dermagraft has a $161 per application co-pay. She cannot afford this 07/23/17; patient arrives today with the wound not much smaller. There is not much new to add. She has not heard from [name] all try to put in a call to  them today. She was asking about Dermagraft again and she has an over $841 per application co-pay she states that she  would be willing to try to do a payment plan. I been tried to avoid this. We've been using silver alginate, I'll change to [facility] 07/30/17-She is here in follow-up evaluation for left foot ulcer. She continues hyperbaric medicine. The left foot ulcer is stable we will continue with same treatment plan 08/06/17; she is here for evaluation of her left foot ulcer. Currently being treated for hyperbarics or underlying osteomyelitis. She is completed antibiotics. The left foot ulcer is better smaller with healthier looking granulation. For various reasons I am not really clear on we never got her back to the OR with [name]. He did not respond to my secure text message. Nevertheless I think that surgery on this point is not necessary nor am I completely clear that a skin substitute is necessary The patient is complaining about pain on the outside of her right foot. 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 There is no erythema. She also states the foot is warm versus her other part of her upper leg and this is largely true. It is not totally clear to me what's causing this. She thinks it's different from her usual neuropathy pain 08/13/17; she arrives in clinic today with a small wound which is superficial on her right first metatarsal head. 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 She tells [place] she was up on her feet over the Mother's Day celebration. ooThe large wound is on the left foot. Continues with hyperbarics for underlying osteomyelitis. We're using Hydrofera Blue. She asked me today about where we were with Dermagraft. I had actually excluded this because of the co- pay however she wants to assume this therefore I'll recheck the co-pay an order for next week. 08/20/17; the patient agreed to accept the co-pay of the first Apligraf which we applied today. She is disappointed she is finishing hyperbarics will run this through the insurance on the extent of  the foot infection and the extent of the wound that she had however she is already had 60 dive's. Dermagraft No. 1 08/27/17; Dermagraft No. 2. She is not eligible for any more hyperbaric treatments this month. She reports a fair amount of drainage and she actually changed to the external dressings without disturbing the direct contact layer 09/03/17; the patient arrived in clinic today with the wound superficially looking quite healthy. Nice vibrant red tissue with some advancing epithelialization although not as much adherence of the flap as I might like. However she noted on her own fourth toe some bogginess and she brought that to our attention. Indeed this was boggy feeling like a possibility of subcutaneous fluid. 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 She is not been unwell. We've been using Dermagraft 09/10/17; the culture that I did not last week was MRSA. She saw Dr. Megan Salon this morning who is going to start her on vancomycin. I had sent him a secure a text message yesterday. I also spoke with her podiatric surgeon [name] about surgery on this foot the options for conserving a functional foot etc. Promised me he would see her and will make back consultation today. Paradoxically her actual wound on the plantar aspect of her left foot looks really quite good. I had given her 5 days worth of Baxdella to  cover her for MRSA. Her MRI came back showing osteomyelitis within the third metatarsal shaft and head and base of the third and fourth proximal phalanx. She had extensive inflammatory changes throughout the soft tissue of the lateral forefoot. With an ill-defined fluid around the fourth metatarsal extending into the plantar and dorsal soft tissues 09/19/17; the patient is actually on oral Septra and Flagyl. She apparently refused IV vancomycin. She also saw [name] at my request who is planning her for a left BKA  sometime in mid July. MRI showed osteomyelitis within the third metatarsal shaft and head and the basis of the third and fourth proximal phalanx. I believe there was felt to be possible septic arthritis involving the third MTP. 09/26/17; the patient went back to Dr. Megan Salon at my suggestion and is now receiving IV daptomycin. Her wound continues to look quite good making the decision to proceed with a transmetatarsal amputation although more difficult for the patient. I believe in my extensive discussions with her she has a good sense of the pros and cons of this. I don't NV the tuft decision she has to make. She has an appointment with [name] I believe in mid July and I previously spoken to him about this issue Has we had used 3 previous Dermagraft. Given the condition of the wound surface I went ahead and added the fourth one today area and I did this not fully realizing that she'll be traveling to [place] next week. I'm hopeful she can come back in 2 weeks 10/21/17; Her same Dermagraft on for about 3-1/2 weeks. In spite of this the wound arrives looking quite healthy. There is been a lot of healing dimensions are smaller. Looking at the square shaped wound she has now there is some undermining and some depth medially under the undermining although I cannot palpate any bone. No surrounding infection is obvious. She has difficult questions about how to look at this going forward vis--vis amputations versus continued medical therapy. To be truthful the wound is looks so healthy and it is continued to contract. Hard to justify foot surgery at this point although I still told her that I think it might come to that if we are not able to eradicate the underlying MRSA. She is still highly at risk and she understands this 11/06/17 on evaluation today patient appears to be doing better in regard to her foot ulcer. She's been tolerating the dressing changes without complication. Currently she is here  for her Dermagraft #6. Her wound continues to make excellent progress at this point. She does not appear to have any evidence of infection which is good news. 11/13/17 on evaluation today patient appears to be doing excellent at this time. She is here for repeat Dermagraft application. This is #7. Overall her wound seems to be making great progress. 12/05/17; the patient arrives with the wound in much better condition than when I last saw this almost 6 weeks ago. She still has a small probing area in the left metatarsal head region on the lateral aspect of her foot. We applied her last Dermagraft today. ooSince the last time she is here she has what appears to a been a blood blister on the plantar aspect of left foot although I don't see this is threatening. There is also a thick raised tissue on the right mid metatarsal head region. This was not there I don't think the last time she was here 3 weeks ago. 12/12/17; the patient continues to have  a small programming area in the left metatarsal head region on the lateral aspect of her foot which was the initial large surgical wound. I applied her last Apligraf last week. I'm going to use Endoform starting today ooUnfortunately she has an excoriated area in the left mid foot and the right mid foot. The left midfoot looks like a blistered area this was not opened last week it certainly is open today. Using silver alginate on these areas. She promises me she is offloading this. 12/19/17; the small probing area in the left metatarsal head eyes think is shallower. In general her original wound looks better. We've been using Endoform. The area inferiorly that I think was trauma last week still requires debridement a lot of nonviable surface which I removed. She still has an open open area distally in her foot ooSimilarly on the right foot there is tightly adherent surface debris which I removed. Still areas that don't look completely epithelialized. This is  a small open area. We used silver alginate on these areas 12/26/2017; the patient did not have the supplies we ordered from last week including the Endoform. The original large wound on the left lateral foot looks healthy. She still has the undermining area that is largely unchanged from last week. She has the same heavily callused raised edged wounds on the right mid and left midfoot. Both of these requiring debridement. We have been using silver alginate on these areas 01/02/2018; there is still supply issues. We are going to try to use Prisma but I am not sure she actually got it from what she is saying. She has a new open area on the lateral aspect of the left fourth toe [previous fifth ray amputation]. Still the one tunneling area over the fourth metatarsal head. The area is in the midfoot bilaterally still have thick callus around them. She is concerned about a raised swelling on the lateral aspect of the foot. However she is completely insensate 01/10/2018; we are using Prisma to the wounds on her bilateral feet. Surprisingly the tunneling area over the left fourth metatarsal head that was part of her original surgery has closed down. She has a small open area remaining on the incision line. 2 open areas in the midfoot. 02/10/2018; the patient arrives back in clinic after a month hiatus. She was traveling to visit family in [place]. Is fairly clear she was not offloading the areas on her feet. The original wound over the left lateral foot at the level of metatarsal heads is reopened and probes medially by about a centimeter or 2. She notes that a week ago she had purulent drainage come out of an area on the left midfoot. Paradoxically the worst area is actually on the right foot is extensive with purulent drainage. We will use silver alginate today 02/17/2018; the patient has 3 wounds one over the left lateral foot. She still has a small area over the metatarsal heads which is the remnant of  her original surgical wound. This has medial probing depth of roughly 1.4 cm somewhat better than last week. The area on the right foot is larger. We have been using silver alginate to all areas. The area on the right foot and left foot that we cultured last week showed both Klebsiella and Proteus. Both of these are quinolone sensitive. The patient put her's self on Bactrim and Flagyl that she had left hanging around from prior antibiotic usages. She was apparently on this last week when she arrived. I  did not realize this. Unfortunately the Bactrim will not cover either 1 of these organisms. We will send in Cipro 500 twice daily for a week 03/04/2018; the patient has 2 wounds on the left foot one is the original wound which was a surgical wound for a deep DFU. At one point this had exposed bone. She still has an area over the fourth metatarsal head that probes about 1.4 cm although I think this is better than last week. I been using silver nitrate to try and promote tissue adherence and been using silver alginate here. ooShe also has an area in the left midfoot. This has some depth but a small linear wound. Still requiring debridement. ooOn the right midfoot is a circular wound. A lot of thick callus around this area. ooWe have been using silver alginate to all wound areas ooShe is completed the ciprofloxacin I gave her 2 weeks ago. 03/11/2018; the patient continues to have 2 open areas on the left foot 1 of which was the original surgical wound for a deep DFU. Only a small probing area remains although this is not much different from last week we have been using silver alginate. The other area is on the midfoot this is smaller linear but still with some depth. We have been using silver alginate here as well ooOn the right foot she has a small circular wound in the mid aspect. This is not much smaller than last time. We have been using silver alginate here as well 03/18/2018; she has 3  wounds on the left foot the original surgical wound, a very superficial wound in the mid aspect and then finally the area in the mid plantar foot. She arrives in today with a very concerning area in the wound in the mid plantar foot which is her most proximal wound. There is undermining here of roughly 1-1/2 cm superiorly. Serosanguineous drainage. She tells me she had some pain on for over the weekend that shot up her foot into her thigh and she tells me that she had a nodule in the groin area. ooShe has the single wound in the right foot. ooWe are using endoform to both wound areas 03/24/2018; the patient arrives with the original surgical wound in the area on the left midfoot about the same as last week. There is a collection of fluid under the surface of the skin extending from the surgical wound towards the midfoot although it does not reach the midfoot wound. The area on the right foot is about the same. Cultures from last week of the left midfoot wound showed abundant Klebsiella abundant Enterococcus faecalis and moderate methicillin resistant staph I gave her Levaquin but this would have only covered the Klebsiella. She will need linezolid 04/01/2018; she is taking linezolid but for the first few days only took 1 a day. I have advised her to finish this at twice daily dosing. In any case all of her wounds are a lot better especially on the left foot. The original surgical wound is closed. The area on the left midfoot considerably smaller. The area on the right foot also smaller. 04/08/2018; her original surgical wound/osteomyelitis on the left foot remains closed. She has area on the left foot that is in the midfoot area but she had some streaking towards this. This is not connected with her original wound at least not visually. ooSmall wound on the right midfoot appears somewhat smaller. 04/15/18; both wounds looks better. Original wound is better left midfoot. Using silver  alginate 1/21; patient states she uses saltwater soak in, stones or remove callus from around her wounds. She is also concerned about a blood blister she had on the left foot but it simply resolved on its own. We've been using silver alginate 1/28; the patient arrives today with the same streaking area from her metatarsals laterally [the site of her original surgical wound] down to the middle of her foot. There is some drainage in the subcutaneous area here. This concerns me that there is actually continued ongoing infection in the metatarsals probably the fourth and third. This fixates an MRI of the foot without contrast [chronic renal failure] ooThe wound in the mid part of the foot is small but I wonder whether this area actually connects with the more distal foot. ooThe area on the right midfoot is probably about the same. Callus thick skin around the small wound which I removed with a curette we have been using silver alginate on both wound areas 2/4; culture I did of the draining site on the left foot last time grew methicillin sensitive staph aureus. MRI of the left foot showed interval resolution of the findings surrounding the third metatarsal joint on the prior study consistent with treated osteomyelitis. Chronic soft tissue ulceration in the plantar and lateral aspect of the forefoot without residual focal fluid collection. No evidence of recurrent osteomyelitis. 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 She tells me she had nausea and vomiting over the weekend and missed 2 days. In general her foot looks much the same. She has a small open area just below the left fourth metatarsal head. A linear area in the left midfoot. Some discoloration  extending from the inferior part of this into the left lateral foot although this appears to be superficial. She has a small area on the right midfoot which generally looks smaller after debridement 2/18; the patient is completing his cephalexin and has another 2 days. She continues to have open areas on the left and right foot. 2/25; she is now off antibiotics. The area on the left foot at the site of her original surgical wound has closed yet again. She still has open areas in the mid part of her foot however these appear smaller. The area on the right mid foot looks about the same. We have been using silver alginate She tells me she had a serious hypoglycemic spell at home. She had to have EMS called and get IV dextrose 3/3; disappointing on the left lateral foot large area of necrotic tissue surrounding the linear area. This appears to track up towards the same original surgical wound. Required extensive debridement. The area on the right plantar foot is not a lot better also using silver 3/12; the culture I did last time showed abundant enterococcus. I have prescribed Augmentin, should cover any unrecognized anaerobes as well. In addition there were a few MRSA and Serratia that would not be well covered although I did not want to give her multiple antibiotics. She comes in today with a new wound in the right midfoot this is not connected with the original wound over her MTP a lot of thick callus tissue around both wounds but once again she said she is not walking on these areas 3/17-Patient  comes in for follow-up on the bilateral plantar wounds, the right midfoot and the left plantar wound. Both these are heavily callused surrounding the wounds. We are continuing to use silver alginate, she is compliant with offloading and states she uses a wheelchair fairly often at home 3/24; both wound areas have thick callus. However things actually look quite a bit better here for the majority of  her left foot and the right foot. 3/31; patient continues to have thick callused somewhat irritated looking tissue around the wounds which individually are fairly superficial. There is no evidence of surrounding infection. We have been using silver alginate however I change that to [employer] today 4/17; patient returns to clinic after having a scare with Covid she tested negative in her primary doctor's office. She has been using Hydrofera Blue. She does not have an open area on the right foot. On the left foot she has a small open area with the mid area not completely viable. She showed me pictures of what looks like a hemorrhagic blister from several days ago but that seems to have healed over this was on the lateral left foot 4/21; patient comes in to clinic with both her wounds on her feet closed. However over the weekend she started having pain in her right foot and leg up into the thigh. She felt as though she was running a low-grade fever but did not take her temperature. She took a doxycycline that she had leftover and yesterday a single Septra and metronidazole. She thinks things feel somewhat better. 4/28; duplex ultrasound I ordered last week was negative for DVT or superficial thrombophlebitis. She is completed the doxycycline I gave her. States she is still having a lot of pain in the right calf and right ankle which is no better than last week. She cannot sleep. She also states she has a temperature of up to 101, coughing and complaining of visual loss in her bilateral eyes. Apparently she was tested for Covid 2 weeks ago at [facility] and that was negative. Readmission: 09/03/18 patient presents back for reevaluation after having been evaluated at the end of April regarding erythema and swelling of her right lower extremity. Subsequently she ended up going to the hospital on 07/29/18 and was admitted not to be discharged until 08/08/18. Unfortunately it was noted during the time that  she was in the hospital that she did have methicillin-resistant Staphylococcus aureus as the infection noted at the site. It was also determined that she did have osteomyelitis which appears to be fairly significant. She was treated with vancomycin and in fact is still on IV vancomycin at dialysis currently. This is actually slated to continue until 09/12/18 at least which will be the completion of the six weeks of therapy. Nonetheless based on what I'm seeing at this point I'm not sure she will be anywhere near ready to discontinue antibiotics at that time. Since she was released from the hospital she was seen by [name] who is her podiatrist on 08/27/18. His note specifically states that he is recommended that the patient needs of one knee amputation on the right as she has a life-threatening situation that can lead quickly to sepsis. The patient advised she would like to try to save her leg to which [name] apparently told her that this was against all medical advice. She also want to discontinue the Wound VAC which had been initiated due to the fact that she wasn't pleased with how the wound was looking  and subsequently she wanted to pursue applying Medihoney at that time. He stated that he did not believe that the right lower extremity was salvageable and that the patient understood but would still like to attempt hyperbaric option therapy if it could be of any benefit. She was therefore referred back to [place] for further evaluation. He plans to see her back next week. Upon inspection today patient has a significant amount purulent drainage noted from the wound at this point. The bone in the distal portion of her foot also appears to be extremely necrotic and spongy. When I push down on the bone it bubbles and seeps purulent drainage from deeper in the end of the foot. I do not think that this is likely going to heal very well at all and less aggressive surgical debridement were undertaken more  than what I believe we can likely do here in our office. 09/12/2018; I have not seen this patient since the most recent hospitalization although she was in our clinic last week. I have reviewed some of her records from a complex hospitalization. 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 There is situation was complicated by MRSA bacteremia and acute on chronic renal failure now on dialysis. She is receiving vancomycin at dialysis. We started her on Dakin's wet-to- dry last week she is changing this daily. There is still purulent drainage coming out of her foot. Although she is apparently "agreeable" to a below-knee amputation which is been suggested by multiple clinicians she wants this to be done in [place]. She apparently has a telehealth visit with that provider sometime in late [place] 6/24. I have told her I think this is probably too long. Nevertheless I could not convince her to allow a local doctor to perform BKA. 09/19/2018; the patient has a large necrotic area on the right anterior foot. She has had previous transmetatarsal amputations. Culture I did last week showed MRSA nothing else she is on vancomycin at dialysis. She has continued leaking purulent drainage out of the distal part of the large circular wound on the right anterior foot. She apparently went to see Dr. 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 Somehow that translated into her being referred to plastic surgery for debridement of the area. I gather she basically refused amputation although I do not have a copy of Dr. Berenice Primas notes. The patient really wants to have a trial of hyperbaric oxygen. I agreed with initial assessment in this clinic that this was probably too far along to benefit however if she is going to have plastic surgery I think she would benefit from ancillary hyperbaric oxygen. The issue here is that the patient has benefited as  maximally as any patient I have ever seen from hyperbaric oxygen therapy. Most recently she had exposed bone on the lateral part of her left foot after a surgical procedure and that actually has closed. She has eschared areas in both heels but no open area. She is remained systemically well. I am not optimistic that anything can be done about this but the patient is very clear that she wants an attempt. The attempt would include a wound VAC further debridements and hyperbaric oxygen along with IV antibiotics. 6/26; I put her in for a trial of hyperbaric oxygen only because of the dramatic response she has had with wounds on her left midfoot earlier this year which was a surgical wound that went straight to her bone  over the metatarsal heads and also remotely the left third toe. We will see if we can get this through our review process and insurance. She arrives in clinic with again purulent material pouring out of necrotic bone on the top of the foot distally. There is also some concerning erythema on the front of the leg that we marked. It is bit difficult to tell how tender this is because of neuropathy. I note from infectious disease that she had her vancomycin extended. 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 She had the wound VAC on for part of the week. The rest of the time she is putting various things on this including Medihoney, "ionized water" silver sorb gel etc. 7/7; follow-up along with HBO. She is still on vancomycin at dialysis. She has a large open area on the dorsal right foot and a small dark eschar area on her heel. There is a lot less erythema in the area and a lot less tenderness. From an infection point of view I think this is better. She still has a lot of necrosis in the remaining right forefoot [previous TMA] we are still using the wound VAC in this area 7/16; follow-up along with HBO. I put her on linezolid after she finished her  vancomycin. We started this last Friday I gave her 2 weeks worth. I had the expectation that she would be operatively debrided by Dr. Marla Roe but that still has not happened yet. Patient phoned the office this week. She arrives for review today after HBO. The distal part of this wound is completely necrotic. Nonviable pieces of tendon bone was still purulent drainage. Also concerning that she has black eschar over the heel that is expanding. I think this may be indicative of infection in this area as well. She has less erythema and warmth in the ankle and calf but still an abnormal exam 7/21 follow-up along with HBO. I will renew her linezolid after checking a CBC with differential monitoring her blood counts especially her platelets. She was supposed to have surgery yesterday but if I am reading things correctly this was canceled after her blood sugar was found to be over 500. I thought Dr. Marla Roe who called me said that they were sending her to the ER but the patient states that was not the case. 7/28. Follow-up along with HBO. She is on linezolid I still do not have any lab work from dialysis even though I called last week. The patient is concerned about an area on her left lateral foot about the level of the base of her fifth metatarsal. I did not really see anything that ominous here however this patient is in [place] ability to point out problems that she is sensing and she has been accurate in the past Finally she received a call from Dr. Marla Roe who is referring her to another orthopedic surgeon stating that she is too booked up to take her to the operating room now. Was still using a wound VAC on the foot 8/3 -Follow-up after HBO, she is got another week of linezolid, she is to call [facility] for an appointment, x-rays of both feet were reviewed, the left foot x-ray with third MTP joint osteo- Right foot x-ray widespread osteo-in the right midfoot Right ankle x-ray does not show any  active evidence of infection 8/11-Patient is seen after HBO, the wounds on the right foot appear to be about the same, the heel wound had some necrotic base  over tendon that was debrided with a curette 8/21; patient is seen after HBO. The patient's wound on her dorsal foot actually looks reasonably good and there is substantial amount of epithelialization however the open area distally still has a lot of necrotic debris partially bone. I cannot really get a good sense of just how deep this probes under the foot. She has been pressuring me this week to order medical maggots through a company in [place] for her. The problem I have is there is not a defined wound area here. On the positive side there is no purulence. She has been to see infectious disease she is still on Septra DS although I have not had a chance to review their notes 8/28; patient is seen in conjunction with HBO. The wounds on her foot continued to improve including the right dorsal foot substantially the, the distal part of this wound and the area on the right heel. We have been using a wound VAC over this chronically. She is still on trimethoprim as directed by infectious disease 9/4; patient is seen in conjunction with HBO. Right dorsal foot wound substantially anteriorly is better however she continues to have a deep wound in the distal part of this that is not responding. We have been using silver collagen under border foam ooArea on the right plantar medial heel seems better. We have been using Hydrofera Blue 12/12/18 on evaluation today patient appears to be doing about the same with regard to her wound based on prior measurements. She does have some necrotic tissue noted on the lateral aspect of the wound that is going require a little bit of sharp debridement today. This includes what appears to be potentially either severely necrotic bone or tendon. Nonetheless other than that she does not appear to have any severe  infection which is good news 9/18; it is been 2 weeks since I saw this wound. She is tolerating HBO well. Continued dramatic improvement in the area on the right dorsal foot. She still has a small wound on the heel that we have been using Hydrofera Blue. She continues with a wound VAC 9/24; patient has to be seen emergently today with a swelling on her right lateral lower leg. She says that she told Dr. Evette Doffing about this and also myself on a couple of occasions but I really have no recollection of this. She is not systemically unwell and her wound really looked good the last time I saw this. She showed this to providers at dialysis and she was able to verify that she was started on cephalexin today for 5 doses at dialysis. She dialyzes on Tuesday Thursday and Saturday. 10/2; patient is seen in conjunction with HBO. The area that is draining on the right anterior medial tibia is more extensive. Copious amounts of serosanguineous drainage with some purulence. We are still using the wound VAC on the original wound then it is stable. Culture I did of the original IandD showed MRSA I contacted dialysis she is now on vancomycin with dialysis treatments. I asked them to run a month 10/9; patient seen in conjunction with HBO. She had a new spontaneous open area just above the wound on the right medial tibia ankle. More swelling on the right medial tibia. Her wound on the foot looks about the same perhaps slightly better. There is no warmth spreading up her leg but no obvious erythema. her MRI of the foot and ankle and distal tib-fib is not booked for next Friday I  discussed this with her in great detail over multiple days. it is likely she has spreading infection upper leg at least involving the distal 25% above the ankle. She knows that if I refer her to orthopedics for infectious disease they are going to recommend amputation and indeed I am not against this myself. We had a good trial at trying to  heal the foot which is what she wanted along with antibiotics debridement and HBO however she clearly has spreading infection [probably staph aureus/MRSA]. Nevertheless she once again tells me she wants to wait the left of the MRI. She still makes comments about having her amputation done in [place]. 10/19; arrives today with significant swelling on the lateral right leg. Last culture I did showed Klebsiella. Multidrug- resistant. Cipro was intermediate sensitivity and that is what I have her on pending her MRI which apparently is going to be done on Thursday this week although this seems to be moving back and forth. She is not systemically unwell. We are using silver alginate on her major wound area on the right medial foot and the draining areas on the right lateral lower leg 10/26; MRI showed extensive abscess in the anterior compartment of the right leg also widespread osteomyelitis involving osseous structures of the midfoot and portions of the hindfoot. Also suspicion for osteomyelitis anterior aspect of the distal medial malleolus. Culture I did of the purulence once again showed a multidrug-resistant Klebsiella. I have been in contact with nephrology late last week and she has been started on cefepime at dialysis to replace the vancomycin We sent a copy of her MRI report to Dr. Geroge Baseman in [place] who is an orthopedic surgeon. The patient takes great stock in his opinion on this. She says she will go to [place] to have her leg amputated if Dr. Geroge Baseman does not feel there is any salvage options. 11/2; she still is not talk to her orthopedic surgeon in [place]. Apparently he will call her at [phone] this afternoon. The quality of this is she has not allowed me to refer her anywhere. She has been told over and over that she needs this amputated but has not agreed to be referred. She tells me her blood sugar was 600 last night but she has not been febrile. 11/9; she never did got a  call from the orthopedic surgeon in [place] therefore that is off the radar. We have arranged to get her see orthopedic surgery at [facility]. She still has a lot of draining purulence coming out of the new abscess in her right leg although that probably came from the osteomyelitis in her right foot and heel. Meanwhile the original wound on the right foot looks very healthy. Continued improvement. The issue is that the last MRI showed osteomyelitis in her right foot extensively she now has an abscess in the right anterior lower leg. There is nobody in [place] who will offer this woman anything but an amputation and to be honest that is probably what she needs. I think she still wants to talk about limb salvage although at this point I just do not see that. She has completed her vancomycin at dialysis which was for the original staph aureus she is still on cefepime for the more recent Klebsiella. She has had a long course of both of these antibiotics which should have benefited the osteomyelitis on the right foot as well as the abscess. 11/16; apparently [place] elective surgery is shut down because of COVID-19 pandemic. I have reached out  to some contacts at [facility] to see if we can get her an orthopedic appointment there. I am concerned about continually leaving this but for the moment everything is static. In fact her original large wound on this foot is closing down. It is the abscess on the right anterior leg that continues to drain purulent serosanguineous material. She is not currently on any antibiotics however she had a prolonged course of vancomycin [1 month] as well as cefepime for a month 02/24/2019 on evaluation today patient appears to be doing better than the last time I saw her. This is not a patient that I typically see. With that being said I am covering for Dr. Dellia Nims this week and again compared to when I last saw her overall the wounds in particular seem to be doing  significantly better which is good news. With that being said the patient tells me several disconcerting things. She has not been able to get in to see anyone for potential debridement in regard to her leg wounds although she tells me that she does not think it is necessary any longer because she is taking care of that herself. She noticed a string coming out of the lower wound on her leg over the last week. The patient states that she subsequently decided that we must of pack something in there and started pulling the string out and as it kept coming and coming she realized this was likely her tendon. With that being said she continued to remove as much of this as she could. She then I subsequently proceeded to using tubes of antibiotic ointment which she will stick down into the wound and then scored as much as she can until she sees it coming out of the other wound opening. She states that in doing this she is actually made things better and there is less redness and irritation. With regard to her foot wound she does have some necrotic tendon and tissue noted in one small corner but again the actual wound itself seems to be doing better with good granulation in general compared to my last evaluation. 12/7; continued improvement in the wound on the substantial part of the right medial foot. Still a necrotic area inferiorly that required debridement but the rest of this looks very healthy and is contracting. She has 2 wounds on the right lateral leg which were her original drainage sites from her abscess but all of this looks a lot better as well. She has been using silver alginate after putting antibiotic biotic ointment in one wound and watching it come out the other. I have talked to her in some detail today. I had given her names of orthopedic surgeons at [facility] for second opinion on what to do about the right leg. I do not think the patient never called them. She has not been able to get a  hold of the orthopedic surgeon in [place] that she had put a lot of faith in as being somebody would give her an opinion that she would trust. I talked to her today and said even if I could get her in to another orthopedic surgeon about the leg which she accept an amputation and she said she would not therefore I am not going to press this issue for the moment 12/14; continued improvement in his substantial wound on the right medial foot. There is still a necrotic area inferiorly with tightly adherent necrotic debris which I have been working on debriding each time she is  here. She does not have an orthopedic appointment. Since last time she was here I looked over her cultures which were essentially MRSA on the foot wound and gram- negative rods in the abscess on the anterior leg. 12/21; continued improvement in the area on the right medial foot. She is not up on this much and that is probably a good thing since I do not know it could support continuous ambulation. She has a small area on the right lateral leg which were remanence of the IandD's I did because of the abscess. I think she should probably have prophylactic antibiotics I am going to have to look this over to see if we can make an intelligent decision here. In the meantime her major wound is come down nicely. Necrotic area inferiorly is still there but looks a lot better 04/06/2019; she has had some improvement in the overall surface area on the right medial foot somewhat narrowedr both but somewhat longer. The areas on the right lateral leg which were initial IandD sites are superficial. Nothing is present on the right heel. We are using silver alginate to the wound areas 1/18; right medial foot somewhat smaller. Still a deep probing area in the most distal recess of the wound. She has nothing open on the right leg. She has a new wound on the plantar aspect of her left fourth toe which may have come from just pulling skin. The  patient using Medihoney on the wound on her foot under silver alginate. I cannot discourage her from this 2/1; 2-week follow-up using silver alginate on the right foot and her left fourth toe. The area on the right dorsal foot is contracted although there is still the deep area in the most distal part of the wound but still has some probing depth. No overt infection 2/15; 2-week follow-up. She continues to have improvement in the surface area on the dorsal right foot. Even the tunneling area from last time is almost closed. The area that was on the plantar part of her left fourth toe over the PIP is indeed closed 3/1; 2-week follow-up. Continued improvement in surface area. The original divot that we have been debriding inferiorly I think has full epithelialization although the epithelialization is gone down into the wound with probably 4 mm of depth. Even under intense illumination I am unable to see anything open here. The remanence of the wound in this area actually look quite healthy. We have been using silver alginate 3/15; 2-week follow-up. Unfortunately not as good today. She has a comma shaped wound on the dorsal foot however the upper part of this is larger. Under illumination debris on the surface She also tells [place] that she was on her right leg 2 times in the last couple of weeks mostly to reach up for things above her head etc. She felt a sharp pain in the right leg which she thinks is somewhere from the ankle to the knee. The patient has neuropathy and is really uncertain. She cannot feel her foot so she does not think it was coming from there 3/29; 2-week follow-up. Her wound measures smaller. Surface of the wound appears reasonable. She is using silver alginate with underlying Medihoney. She has home health. X-rays I did of her tib-fib last time were negative although it did show arterial calcification Objective Constitutional Patient is hypertensive.. Pulse regular and within  target range for patient.Marland Kitchen Respirations regular, non-labored and within target range.. Temperature is normal and within the target range for  the patient.Marland Kitchen Appears in no distress. Vitals Time Taken: 1:04 PM, Height: 67 in, Weight: 125 lbs, BMI: 19.6, Temperature: 98.6 F, Pulse: 81 bpm, Respiratory Rate: 18 breaths/min, Blood Pressure: 160/79 mmHg. Respiratory work of breathing is normal. Cardiovascular Pedal pulses are palpable on the right. General Notes: Wound exam; the wound is smaller. No debridement was required. The areas on the dorsal medial right foot Integumentary (Hair, Skin) Wound #43 status is Open. Original cause of wound was Gradually Appeared. The wound is located on the Right,Medial Foot. The wound measures 3.8cm length x 1.5cm width x 0.2cm depth; 4.477cm^2 area and 0.895cm^3 volume. There is Fat Layer (Subcutaneous Tissue) Exposed exposed. There is no tunneling or undermining noted. There is a medium amount of serosanguineous drainage noted. The wound margin is flat and intact. There is large (67-100%) red, pink granulation within the wound bed. There is a small (1-33%) amount of necrotic tissue within the wound bed including Adherent Slough. 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 Dressing Change Frequency: Wound #43 Right,Medial Foot: Change dressing three times week. Wound Cleansing: Clean wound with Wound Cleanser - Anasept to all wounds Primary Wound Dressing: Wound #43 Right,Medial Foot: Calcium Alginate with Silver Other: - may use thin layer of medihoney under alginate Secondary Dressing: Wound #43 Right,Medial Foot: Kerlix/Rolled [name] Dry [name] Edema Control: Elevate legs to the level of the heart or above for 30 minutes daily  and/or when sitting, a frequency of: - throughout the day Home Health: [facility] for wound care. - Interim 1. Continue with Medihoney with silver alginate which seems to be working for her 2. At this point could consider an advanced treatment product however we will see how this goes over the next 2 weeks or so Electronic Signature(s) Signed: 06/29/2019 5:31:36 PM By: Linton Ham MD Entered By: Linton Ham on 06/29/2019 14:08:40 -------------------------------------------------------------------------------- SuperBill Details Patient Name: Date of Service: [name] 06/29/2019 Medical Record Number:[id number] Patient Account Number: [id number] Date of Birth/Sex: Treating RN: 1970/11/06 (49 y.o. 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

## 2019-06-30 ENCOUNTER — Telehealth: Payer: Self-pay | Admitting: Endocrinology

## 2019-06-30 NOTE — Telephone Encounter (Signed)
Does Dr. Dwyane Dee have any openings next week-Tues-Friday for this patient to do a pump start.  I just received notice today that she got her Dexcom

## 2019-06-30 NOTE — Telephone Encounter (Signed)
MEDICATION: furosemide  PHARMACY:   [id number] - [place],  - [place] Phone:  [phone]  Fax:  [phone]     IS THIS A 90 DAY SUPPLY : yes  IS PATIENT OUT OF MEDICATION: no  IF NOT; HOW MUCH IS LEFT: 2 left  LAST APPOINTMENT DATE: @3 /11/2019  NEXT APPOINTMENT DATE:@Visit  date not found  DO WE HAVE YOUR PERMISSION TO LEAVE A DETAILED MESSAGE: yes  OTHER COMMENTS: Patient called asking for refill - I did not see on her chart though.   **Let patient know to contact pharmacy at the end of the day to make sure medication is ready. **  ** Please notify patient to allow 48-72 hours to process**  **Encourage patient to contact the pharmacy for refills or they can request refills through [facility]**

## 2019-06-30 NOTE — Telephone Encounter (Signed)
Please advise if refill is appropriate 

## 2019-06-30 NOTE — Telephone Encounter (Signed)
Are you able to better determine Dr. Ronnie Derby availabilities?

## 2019-06-30 NOTE — Telephone Encounter (Signed)
Called pt and informed her about Dr. Ronnie Derby response below. While attempting to schedule an appt with Dr. Dwyane Dee, pt was asked if she had a preference of day or time. Pt snarkingly stated, "of course it matters since I have specific dialysis days and can't be seen until after 12pm", proceed to further add hastily, "and since I have not received a call from [facility], I cannot schedule an appt with Dr. Dwyane Dee because I need to see her first, then will need to see him several times after." Pt was advised this message is being forwarded to [facility] for scheduling/coordination purposes.

## 2019-06-30 NOTE — Telephone Encounter (Signed)
This needs to be done by nephrologist.  Also she needs to make follow-up appointment

## 2019-07-01 NOTE — Telephone Encounter (Signed)
If Dr. Dwyane Dee approved, I can place pt in acute slots for next week.

## 2019-07-01 NOTE — Telephone Encounter (Signed)
The only opportunities that I see he might have is his 930 AM (ACUTE) spots, but a couple of those would have to be scheduled by someone with override abilities. IT does seem that this would be problematic with PT due to dialysis days.

## 2019-07-01 NOTE — Telephone Encounter (Signed)
Vaughan Basta, Dr. Dwyane Dee does have availability next week please just let me know when she is set with you and I will add her to Dr. Dwyane Dee as usual. Thank you!

## 2019-07-01 NOTE — Telephone Encounter (Signed)
Please refer to front desk response re: coordination of appts with BOTH you and Dr. Dwyane Dee

## 2019-07-03 ENCOUNTER — Telehealth: Payer: Self-pay | Admitting: Nutrition

## 2019-07-03 NOTE — Progress Notes (Signed)
PATTI, SHORB ([id number]) Visit Report for 06/29/2019 Arrival Information Details Patient Name: Date of Service: DESMOND, TUFANO 06/29/2019 12:45 PM Medical Record Number:[id number] Patient Account Number: [id number] Date of Birth/Sex: Treating RN: 09/01/1970 (49 y.o. [name] Primary Care Brenen Beigel: [name] Other Clinician: Referring Rehaan Viloria: Treating Ryen Heitmeyer/Extender:[name], [name], [name] in Treatment: 10 Visit Information History Since Last Visit All ordered tests and consults were completed: Yes Patient Arrived: Wheel Chair Added or deleted any medications: No Arrival Time: 13:00 Any new allergies or adverse reactions: No Accompanied By: self Had a fall or experienced change in No activities of daily living that may affect Transfer Assistance: None risk of falls: Patient Identification Verified: Yes Signs or symptoms of abuse/neglect since last No Secondary Verification Process Completed: Yes visito Patient Requires Transmission-Based No Hospitalized since last visit: No Precautions: Implantable device outside of the clinic excluding No Patient Has Alerts: No cellular tissue based products placed in the center since last visit: Has Dressing in Place as Prescribed: Yes Pain Present Now: No Electronic Signature(s) Signed: 06/29/2019 5:06:29 PM By: [name] Entered By: [name] on 06/29/2019 13:05:02 -------------------------------------------------------------------------------- Clinic Level of Care Assessment Details Patient Name: Date of Service: ONYA, EUTSLER 06/29/2019 12:45 PM Medical Record Number:[id number] Patient Account Number: [id number] Date of Birth/Sex: Treating RN: 1971-02-12 (49 y.o. [name] Primary Care Ellarie Picking: [name] Other Clinician: Referring Harvey Lingo: Treating Lelia Jons/Extender:[name], [name], [name] in Treatment: 79 Clinic Level of Care  Assessment Items TOOL 4 Quantity Score []  - Use when only an EandM is performed on FOLLOW-UP visit 0 ASSESSMENTS - Nursing Assessment / Reassessment X - Reassessment of Co-morbidities (includes updates in patient status) 1 10 X - Reassessment of Adherence to Treatment Plan 1 5 ASSESSMENTS - Wound and Skin Assessment / Reassessment X - Simple Wound Assessment / Reassessment - one wound 1 5 []  - Complex Wound Assessment / Reassessment - multiple wounds 0 []  - Dermatologic / Skin Assessment (not related to wound area) 0 ASSESSMENTS - Focused Assessment []  - Circumferential Edema Measurements - multi extremities 0 []  - Nutritional Assessment / Counseling / Intervention 0 X - Lower Extremity Assessment (monofilament, tuning fork, pulses) 1 5 []  - Peripheral Arterial Disease Assessment (using hand held doppler) 0 ASSESSMENTS - Ostomy and/or Continence Assessment and Care []  - Incontinence Assessment and Management 0 []  - Ostomy Care Assessment and Management (repouching, etc.) 0 PROCESS - Coordination of Care X - Simple Patient / Family Education for ongoing care 1 15 []  - Complex (extensive) Patient / Family Education for ongoing care 0 X - Staff obtains Programmer, systems, Records, Test Results / Process Orders 1 10 X - Staff telephones HHA, Nursing Homes / Clarify orders / etc 1 10 []  - Routine Transfer to another Facility (non-emergent condition) 0 []  - Routine Hospital Admission (non-emergent condition) 0 []  - New Admissions / Biomedical engineer / Ordering NPWT, Apligraf, etc. 0 []  - Emergency Hospital Admission (emergent condition) 0 X - Simple Discharge Coordination 1 10 []  - Complex (extensive) Discharge Coordination 0 PROCESS - Special Needs []  - Pediatric / Minor Patient Management 0 []  - Isolation Patient Management 0 []  - Hearing / Language / Visual special needs 0 []  - Assessment of Community assistance (transportation, D/C planning, etc.) 0 []  - Additional assistance / Altered  mentation 0 []  - Support Surface(s) Assessment (bed, cushion, seat, etc.) 0 INTERVENTIONS - Wound Cleansing / Measurement X - Simple Wound Cleansing - one wound 1 5 []  - Complex Wound Cleansing -  multiple wounds 0 X - Wound Imaging (photographs - any number of wounds) 1 5 []  - Wound Tracing (instead of photographs) 0 X - Simple Wound Measurement - one wound 1 5 []  - Complex Wound Measurement - multiple wounds 0 INTERVENTIONS - Wound Dressings X - Small Wound Dressing one or multiple wounds 1 10 []  - Medium Wound Dressing one or multiple wounds 0 []  - Large Wound Dressing one or multiple wounds 0 X - Application of Medications - topical 1 5 []  - Application of Medications - injection 0 INTERVENTIONS - Miscellaneous []  - External ear exam 0 []  - Specimen Collection (cultures, biopsies, blood, body fluids, etc.) 0 []  - Specimen(s) / Culture(s) sent or taken to Lab for analysis 0 []  - Patient Transfer (multiple staff / Civil Service fast streamer / Similar devices) 0 []  - Simple Staple / Suture removal (25 or less) 0 []  - Complex Staple / Suture removal (26 or more) 0 []  - Hypo / Hyperglycemic Management (close monitor of Blood Glucose) 0 []  - Ankle / Brachial Index (ABI) - do not check if billed separately 0 X - Vital Signs 1 5 Has the patient been seen at the hospital within the last three years: Yes Total Score: 105 Level Of Care: New/Established - Level 3 Electronic Signature(s) Signed: 06/29/2019 5:31:53 PM By: Baruch Gouty RN, BSN Entered By: Baruch Gouty on 06/29/2019 13:43:51 -------------------------------------------------------------------------------- Encounter Discharge Information Details Patient Name: Date of Service: [name] 06/29/2019 12:45 PM Medical Record Number:[id number] Patient Account Number: [id number] Date of Birth/Sex: Treating RN: [birth year] (49 y.o. [name] Primary Care Andranik Jeune: [name] Other Clinician: Referring Kimyata Milich: Treating  Tomika Eckles/Extender:[name], [name], [name] in Treatment: 32 Encounter Discharge Information Items Discharge Condition: Stable Ambulatory Status: Wheelchair Discharge Destination: Home Transportation: Other Accompanied By: self Schedule Follow-up Appointment: Yes Clinical Summary of Care: Patient Declined Electronic Signature(s) Signed: 06/29/2019 5:06:29 PM By: [name] Entered By: [name] on 06/29/2019 13:58:44 -------------------------------------------------------------------------------- Lower Extremity Assessment Details Patient Name: Date of Service: [name] 06/29/2019 12:45 PM Medical Record Number:[id number] Patient Account Number: [id number] Date of Birth/Sex: Treating RN: [birth year] (49 y.o. [name] Primary Care Yeiden Frenkel: [name] Other Clinician: Referring Brennen Camper: Treating Neiman Roots/Extender:[name], [name], [name] in Treatment: 42 Edema Assessment Assessed: [Left: No] [Right: Yes] Edema: [Left: N] [Right: o] Calf Left: Right: Point of Measurement: cm From Medial Instep cm 29 cm Ankle Left: Right: Point of Measurement: cm From Medial Instep cm 18.5 cm Electronic Signature(s) Signed: 06/29/2019 5:06:29 PM By: [name] Entered By: [name] on 06/29/2019 13:06:03 -------------------------------------------------------------------------------- Multi Wound Chart Details Patient Name: Date of Service: [name] 06/29/2019 12:45 PM Medical Record Number:[id number] Patient Account Number: [id number] Date of Birth/Sex: Treating RN: 02-29-1971 (49 y.o. Nancy Fetter Primary Care Julious Langlois: [name] Other Clinician: Referring Roczen Waymire: Treating Erielle Gawronski/Extender:[name], [name], [name] in Treatment: 28 Vital Signs Height(in): 88 Pulse(bpm): 79 Weight(lbs): 125 Blood Pressure(mmHg): 160/79 Body Mass Index(BMI): 20 Temperature(F):  98.6 Respiratory 18 Rate(breaths/min): Photos: [43:No Photos] [N/A:N/A] Wound Location: [43:Right Foot - Medial] [N/A:N/A] Wounding Event: [43:Gradually Appeared] [N/A:N/A] Primary Etiology: [43:Diabetic Wound/Ulcer of the N/A Lower Extremity] Comorbid History: [43:Cataracts, Chronic sinus N/A problems/congestion, Anemia, Sleep Apnea, Deep Vein Thrombosis, Hypertension, Peripheral Arterial Disease, Type I Diabetes, Osteoarthritis, Osteomyelitis, Neuropathy, Seizure Disorder] Date Acquired: [43:08/04/2018] [N/A:N/A] Weeks of Treatment: [43:42] [N/A:N/A] Wound Status: [43:Open] [N/A:N/A] Clustered Wound: [43:Yes] [N/A:N/A] Clustered Quantity: [43:1] [N/A:N/A] Measurements L x W x D 3.8x1.5x0.2 [N/A:N/A] (cm) Area (cm) : [43:4.477] [N/A:N/A] Volume (cm) : [  43:0.895] [N/A:N/A] % Reduction in Area: [43:93.80%] [N/A:N/A] % Reduction in Volume: 95.90% [N/A:N/A] Classification: [43:Grade 4] [N/A:N/A] Exudate Amount: [43:Medium] [N/A:N/A] Exudate Type: [43:Serosanguineous] [N/A:N/A] Exudate Color: [43:red, brown] [N/A:N/A] Wound Margin: [43:Flat and Intact] [N/A:N/A] Granulation Amount: [43:Large (67-100%)] [N/A:N/A] Granulation Quality: [43:Red, Pink] [N/A:N/A] Necrotic Amount: [43:Small (1-33%)] [N/A:N/A] Exposed Structures: [43:Fat Layer (Subcutaneous Tissue) Exposed: Yes Fascia: No Tendon: No Muscle: No Joint: No Bone: No Medium (34-66%)] [N/A:N/A N/A] Treatment Notes Wound #43 (Right, Medial Foot) 1. Cleanse With Wound Cleanser 2. Periwound Care Skin Prep 3. Primary Dressing Applied Calcium Alginate Ag 4. Secondary Dressing Dry Gauze Other secondary dressing (specify in notes) Notes ace wrap patient request. Electronic Signature(s) Signed: 06/29/2019 5:31:36 PM By: Linton Ham MD Signed: 07/03/2019 5:28:43 PM By: [name] RN, BSN Entered By: Linton Ham on 06/29/2019  10:93:23 -------------------------------------------------------------------------------- Multi-Disciplinary Care Plan Details Patient Name: Date of Service: [name] 06/29/2019 12:45 PM Medical Record Number:[id number] Patient Account Number: [id number] Date of Birth/Sex: Treating RN: 07/24/1970 (49 y.o. [name] Primary Care Tonjua Rossetti: [name] Other Clinician: Referring Marisue Canion: Treating Searra Carnathan/Extender:[name], [name], [name] in Treatment: 52 Active Inactive Wound/Skin Impairment Nursing Diagnoses: Impaired tissue integrity Knowledge deficit related to ulceration/compromised skin integrity Goals: Patient/caregiver will verbalize understanding of skin care regimen Date Initiated: 09/03/2018 Target Resolution Date: 07/27/2019 Goal Status: Active Ulcer/skin breakdown will have a volume reduction of 30% by week 4 Date Initiated: 09/03/2018 Date Inactivated: 10/07/2018 Target Resolution Date: 10/01/2018 Unmet Goal Status: Unmet Reason: COMORBITIES Ulcer/skin breakdown will have a volume reduction of 50% by week 8 Date Initiated: 10/07/2018 Date Inactivated: 11/03/2018 Target Resolution Date: 10/31/2018 Unmet Goal Status: Unmet Reason: Osteomyelitis Interventions: Assess patient/caregiver ability to obtain necessary supplies Assess patient/caregiver ability to perform ulcer/skin care regimen upon admission and as needed Assess ulceration(s) every visit Provide education on ulcer and skin care Treatment Activities: Skin care regimen initiated : 09/03/2018 Topical wound management initiated : 09/03/2018 Notes: Electronic Signature(s) Signed: 06/29/2019 5:31:53 PM By: Baruch Gouty RN, BSN Entered By: Baruch Gouty on 06/29/2019 13:19:15 -------------------------------------------------------------------------------- Pain Assessment Details Patient Name: Date of Service: [name] 06/29/2019 12:45 PM Medical Record Number:[id number]  Patient Account Number: [id number] Date of Birth/Sex: Treating RN: Aug 16, 1970 (49 y.o. [name] Primary Care Suzi Hernan: [name] Other Clinician: Referring Xzavien Harada: Treating Amando Ishikawa/Extender:[name], [name], [name] in Treatment: 32 Active Problems Location of Pain Severity and Description of Pain Patient Has Paino No Site Locations Rate the pain. Current Pain Level: 0 Pain Management and Medication Current Pain Management: Medication: No Cold Application: No Rest: No Massage: No Activity: No T.E.N.S.: No Heat Application: No Leg drop or elevation: No Is the Current Pain Management Adequate: Adequate How does your wound impact your activities of daily livingo Sleep: No Bathing: No Appetite: No Relationship With Others: No Bladder Continence: No Emotions: No Bowel Continence: No Work: No Toileting: No Drive: No Dressing: No Hobbies: No Electronic Signature(s) Signed: 06/29/2019 5:06:29 PM By: [name] Entered By: [name] on 06/29/2019 13:05:53 -------------------------------------------------------------------------------- Patient/Caregiver Education Details Patient Name: Date of Service: [name] 3/29/2021andnbsp12:45 PM Medical Record Patient Account Number: [id number] Number: Treating RN: Baruch Gouty Date of Birth/Gender: 12-16-1970 (49 y.o. F) Other Clinician: Primary Care Physician: Katharine Look Referring Physician: Physician/Extender: Juanito Doom in Treatment: 54 Education Assessment Education Provided To: Patient Education Topics Provided Wound/Skin Impairment: Methods: Explain/Verbal Responses: Reinforcements needed, State content correctly Electronic Signature(s) Signed: 06/29/2019 5:31:53 PM By: Baruch Gouty RN, BSN Entered By: Baruch Gouty on  06/29/2019  13:40:07 -------------------------------------------------------------------------------- Wound Assessment Details Patient Name: Date of Service: GIULIETTA, PROKOP 06/29/2019 12:45 PM Medical Record Number:[id number] Patient Account Number: [id number] Date of Birth/Sex: Treating RN: 02/23/1971 (49 y.o. [name] Primary Care Torryn Hudspeth: [name] Other Clinician: Referring Wylie Russon: Treating Koby Pickup/Extender:[name], [name], [name] in Treatment: 42 Wound Status Wound Number: 43 Primary Diabetic Wound/Ulcer of the Lower Extremity Etiology: Wound Location: Right Foot - Medial Wound Open Wounding Event: Gradually Appeared Status: Date Acquired: 08/04/2018 Comorbid Cataracts, Chronic sinus problems/congestion, Weeks Of Treatment: 42 History: Anemia, Sleep Apnea, Deep Vein Thrombosis, Clustered Wound: Yes Hypertension, Peripheral Arterial Disease, Type I Diabetes, Osteoarthritis, Osteomyelitis, Neuropathy, Seizure Disorder Photos Photo Uploaded By: [name] on 07/03/2019 11:40:54 Wound Measurements Length: (cm) 3.8 Width: (cm) 1.5 Depth: (cm) 0.2 Clustered Quantity: 1 Area: (cm) 4.477 Volume: (cm) 0.895 Wound Description Classification: Grade 4 Wound Margin: Flat and Intact Exudate Amount: Medium Exudate Type: Serosanguineous Exudate Color: red, brown Wound Bed Granulation Amount: Large (67-100%) Granulation Quality: Red, Pink Necrotic Amount: Small (1-33%) Necrotic Quality: Adherent Slough Treatment Notes Wound #43 (Right, Medial Foot) 1. Cleanse With Wound Cleanser 2. Periwound Care Skin Prep 3. Primary Dressing Applied Calcium Alginate Ag 4. Secondary Dressing Dry Gauze Other secondary dressing (specify in notes fter Cleansing: No ino Yes Exposed Structure sed: No Subcutaneous Tissue) Exposed: Yes sed: No sed: No ed: No d: No % Reduction in Area: 93.8% % Reduction in Volume: 95.9% Epithelialization: Medium  (34-66%) Tunneling: No Undermining: No Foul Odor A Slough/Fibr Fascia Expo Fat Layer ( Tendon Expo Muscle Expo Joint Expos Bone Expose ) Notes ace wrap patient request. Electronic Signature(s) Signed: 06/29/2019 5:06:29 PM By: [name] Entered By: [name] on 06/29/2019 13:06:49 -------------------------------------------------------------------------------- Vitals Details Patient Name: Date of Service: [name] 06/29/2019 12:45 PM Medical Record Number:[id number] Patient Account Number: [id number] Date of Birth/Sex: Treating RN: 1970-10-18 (49 y.o. [name] Primary Care Jolly Bleicher: [name] Other Clinician: Referring Kasee Hantz: Treating Jachai Okazaki/Extender:[name], [name], [name] in Treatment: 47 Vital Signs Time Taken: 13:04 Temperature (F): 98.6 Height (in): 67 Pulse (bpm): 81 Weight (lbs): 125 Respiratory Rate (breaths/min): 18 Body Mass Index (BMI): 19.6 Blood Pressure (mmHg): 160/79 Reference Range: 80 - 120 mg / dl Electronic Signature(s) Signed: 06/29/2019 5:06:29 PM By: [name] Entered By: [name] on 06/29/2019 13:05:43

## 2019-07-03 NOTE — Telephone Encounter (Signed)
Left message again for availability for pump start on Monday.  Message was left to call the office and ask for [facility] to schedule pump start with Dr. Dwyane Dee for Tues., Wed and Thursday, if available to come in.  Also to let her know if she wants the [place] appointment or the 12:30 appointment with me..  Expressed need for urgency on this call to see if appointments are still available to her at those times.Marland Kitchen

## 2019-07-03 NOTE — Telephone Encounter (Signed)
Message left to call me to schedule pump start next week on Monday.  Told her that I have a 8AM slot or 12:30 slot, but must let me know this AM.

## 2019-07-13 ENCOUNTER — Other Ambulatory Visit: Payer: Self-pay

## 2019-07-13 ENCOUNTER — Encounter (HOSPITAL_BASED_OUTPATIENT_CLINIC_OR_DEPARTMENT_OTHER): Payer: Medicare HMO | Attending: Internal Medicine | Admitting: Internal Medicine

## 2019-07-13 DIAGNOSIS — E1022 Type 1 diabetes mellitus with diabetic chronic kidney disease: Secondary | ICD-10-CM | POA: Insufficient documentation

## 2019-07-13 DIAGNOSIS — G4730 Sleep apnea, unspecified: Secondary | ICD-10-CM | POA: Insufficient documentation

## 2019-07-13 DIAGNOSIS — Z9641 Presence of insulin pump (external) (internal): Secondary | ICD-10-CM | POA: Insufficient documentation

## 2019-07-13 DIAGNOSIS — D631 Anemia in chronic kidney disease: Secondary | ICD-10-CM | POA: Insufficient documentation

## 2019-07-13 DIAGNOSIS — I129 Hypertensive chronic kidney disease with stage 1 through stage 4 chronic kidney disease, or unspecified chronic kidney disease: Secondary | ICD-10-CM | POA: Diagnosis not present

## 2019-07-13 DIAGNOSIS — Z992 Dependence on renal dialysis: Secondary | ICD-10-CM | POA: Diagnosis not present

## 2019-07-13 DIAGNOSIS — Z86718 Personal history of other venous thrombosis and embolism: Secondary | ICD-10-CM | POA: Diagnosis not present

## 2019-07-13 DIAGNOSIS — M8580 Other specified disorders of bone density and structure, unspecified site: Secondary | ICD-10-CM | POA: Insufficient documentation

## 2019-07-13 DIAGNOSIS — L97529 Non-pressure chronic ulcer of other part of left foot with unspecified severity: Secondary | ICD-10-CM | POA: Diagnosis not present

## 2019-07-13 DIAGNOSIS — M86671 Other chronic osteomyelitis, right ankle and foot: Secondary | ICD-10-CM | POA: Diagnosis not present

## 2019-07-13 DIAGNOSIS — G40909 Epilepsy, unspecified, not intractable, without status epilepticus: Secondary | ICD-10-CM | POA: Insufficient documentation

## 2019-07-13 DIAGNOSIS — Z87891 Personal history of nicotine dependence: Secondary | ICD-10-CM | POA: Diagnosis not present

## 2019-07-13 DIAGNOSIS — L97514 Non-pressure chronic ulcer of other part of right foot with necrosis of bone: Secondary | ICD-10-CM | POA: Diagnosis not present

## 2019-07-13 DIAGNOSIS — M1990 Unspecified osteoarthritis, unspecified site: Secondary | ICD-10-CM | POA: Insufficient documentation

## 2019-07-13 DIAGNOSIS — E1052 Type 1 diabetes mellitus with diabetic peripheral angiopathy with gangrene: Secondary | ICD-10-CM | POA: Insufficient documentation

## 2019-07-13 DIAGNOSIS — Z9119 Patient's noncompliance with other medical treatment and regimen: Secondary | ICD-10-CM | POA: Diagnosis not present

## 2019-07-13 DIAGNOSIS — N189 Chronic kidney disease, unspecified: Secondary | ICD-10-CM | POA: Diagnosis not present

## 2019-07-13 DIAGNOSIS — E1042 Type 1 diabetes mellitus with diabetic polyneuropathy: Secondary | ICD-10-CM | POA: Insufficient documentation

## 2019-07-13 DIAGNOSIS — E10621 Type 1 diabetes mellitus with foot ulcer: Secondary | ICD-10-CM | POA: Insufficient documentation

## 2019-07-13 DIAGNOSIS — L02415 Cutaneous abscess of right lower limb: Secondary | ICD-10-CM | POA: Insufficient documentation

## 2019-07-14 NOTE — Progress Notes (Signed)
CRISTEN, BREDESON ([id number]) Visit Report for 07/13/2019 Debridement Details Patient Name: [name], [name]. Date of Service: 07/13/2019 12:45 PM Medical Record Number:[id number] Patient Account Number: [id number] Date of Birth/Sex: Treating RN: [birth year] (49 y.o. Nancy Fetter Primary Care Provider: [name] Other Clinician: Referring Provider: Treating Provider/Extender:Ammi Hutt, [name], [name] in Treatment: 46 Debridement Performed for Wound #43 Right,Medial Foot Assessment: Performed By: Physician Ricard Dillon., MD Debridement Type: Debridement Severity of Tissue Pre Fat layer exposed Debridement: Level of Consciousness (Pre- Awake and Alert procedure): Pre-procedure Verification/Time Out Taken: Yes - 13:08 Start Time: 13:08 Total Area Debrided (L x W): 3.5 (cm) x 1.6 (cm) = 5.6 (cm) Tissue and other material Viable, Non-Viable, Slough, Subcutaneous, Slough debrided: Level: Skin/Subcutaneous Tissue Debridement Description: Excisional Instrument: Curette Bleeding: Moderate Hemostasis Achieved: Pressure End Time: 13:09 Procedural Pain: 0 Post Procedural Pain: 0 Response to Treatment: Procedure was tolerated well Level of Consciousness Awake and Alert (Post-procedure): Post Debridement Measurements of Total Wound Length: (cm) 3.5 Width: (cm) 1.6 Depth: (cm) 0.2 Volume: (cm) 0.88 Character of Wound/Ulcer Post Improved Debridement: Severity of Tissue Post Debridement: Fat layer exposed Post Procedure Diagnosis Same as Pre-procedure Electronic Signature(s) Signed: 07/13/2019 6:12:10 PM By: [name] RN, BSN Signed: 07/14/2019 5:51:00 PM By: Linton Ham MD Entered By: Linton Ham on 07/13/2019 13:25:11 -------------------------------------------------------------------------------- HPI Details Patient Name: Date of Service: [name] 07/13/2019 12:45 PM Medical Record Number:[id number] Patient Account Number:  [id number] Date of Birth/Sex: Treating RN: 03/14/1971 (49 y.o. Nancy Fetter Primary Care Provider: [name] Other Clinician: Referring Provider: Treating Provider/Extender:Scarlett Portlock, [name], [name] in Treatment: 53 History of Present Illness HPI Description: 49 year old diabetic who is known to have type 1 diabetes which is poorly controlled last hemoglobin A1c was 11%. She comes in with a ulcerated area on the left lateral foot which has been there for over 6 months. Was recently she has been treated by [name] of podiatry who saw her last on 05/28/2016. Review of his notes revealed that the patient had incision and drainage with placement of antibiotic beads to the left foot on 04/11/2016 for possible osteomyelitis of the cuboid bone. Over the last year she's had a history of amputation of the left fifth toe and a femoropopliteal popliteal bypass graft somewhere in April 2017. 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 His note [name] mentions that the patient has been referred to me for further wound care and possibly great candidate for hyperbaric oxygen therapy due to recurrent osteomyelitis. However we do not have any x-rays of biopsy reports confirming this. He has been on several antibiotics including Bactrim and most recently is on doxycycline for an MRSA. I understand, the patient was not a candidate for IV antibiotics as she has had previous PICC lines which resulted in blood clots in both arms. There was a x-ray report dated 04/04/2016 on [name] notes which showed evidence of fifth ray resection left foot with osteolytic changes noted to the fourth metatarsal and cuboid bone on the left. 06/13/2016 -- had a left foot x-ray which showed no acute fracture or dislocation and no definite radiographic evidence of osteomyelitis. Advanced osteopenia was seen. 06/20/2016 -- she has noticed a new wound on the right plantar foot in the region  where she had a callus before. 06/27/16- the patient did have her x-ray of the right foot which showed no findings to suggest osteomyelitis. She saw her endocrinologist, [name], yesterday. Her A1c in January was 11. He  also indicates mismanagement and noncompliance regarding her diabetes. She is currently on Bactrim for a lip infection. She is complaining of nausea, vomiting and diarrhea. She is unable to articulate the exact orders or dosing of the Bactrim; it is unclear when she will complete this. 07/04/2016 -- results from [facility] of ABIs with ankle waveforms were noted from 02/14/2016. The examination done on 06/27/2015 showed noncompressible ABIs with the right being 1.45 and the left being 1.33. The present examination showed a right ABI of 1.19 on the left of 1.33. The conclusion was that right normal ABI in the lower extremity at rest however compared to previous study which was noncompressible ABI may be falsely elevated side suggesting medial calcification. The left ABI suggested medial calcification. 08/01/2016 -- the patient had more redness and pain on her right foot and did not get to come to see as noted she see her PCP or go to the ER and decided to take some leftover metronidazole which she had at home. As usual, the patient does report she feels and is rather noncompliant. 08/08/2016 -- -- x-ray of the right foot -- FINDINGS:Transmetatarsal amputation is noted. No bony destruction is noted to suggest osteomyelitis. IMPRESSION: No evidence of osteomyelitis. Postsurgical changes are seen. MRI would be more sensitive for possible bony changes. Culture has grown Serratia Marcescens -- sensitive to Bactrim, ciprofloxacin, ceftazidime she was seen by [name] on 08/06/2016. He did not find any exposed bone, muscle, tendon, ligament or joint. There was no malodor and he did a excisional debridement in the office. ============ Old notes: 49 year old patient who is  known to the wound clinic for a while had been away from the wound clinic since 09/01/2014. Over the last several months she has been admitted to various hospitals including [place] at [facility]. She was treated for a right metatarsal osteomyelitis with a transmetatarsal amputation and this was done about 2 months ago. He has a small ulcerated area on the right heel and she continues to have an ulcerated area on the left plantar aspect of the foot. The patient was recently admitted to the [facility] hospital group between 7/12 and 10/18/2014. she was given 3 weeks of IV vancomycin and was to follow-up with her surgeons at [facility] and also took oral vancomycin for C. difficile colitis. 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 MRI of the left foot done on 10/14/2014 showed no abscess or osteomyelitis. 04/27/15; this is a patient we know from previous stays in the wound care center. She is a type I diabetic I am not sure of her control currently. Since the last time I saw her she is had a right transmetatarsal amputation and has no wounds on her right foot and has no open wounds. She is been followed at the wound care center at [facility] in [place]. She comes today with the desire to undergo hyperbaric treatment locally. Apparently one of her wound care providers in [place] has suggested hyperbarics. This is in response to an MRI from 04/18/15 that showed increased marrow signal and loss of the proximal fifth metatarsal cortex evidence of osteomyelitis with likely early osteomyelitis in the cuboid bone as well. She has a large wound over the base of the fifth metatarsal. She also has a eschar  over her the  tips of her toes on 1,3 and 5. She does not have peripheral pulses and apparently is going for an angiogram tomorrow which seems reasonable. After this she is going to infectious disease at [facility]. They have been using Medihoney to the large wound on the lateral aspect of the left foot to. The patient has known Charcot deformity from diabetic neuropathy. She also has known diabetic PAD. Surprisingly I can't see that she has had any recent antibiotics, the patient states the last antibiotic she had was at the end of November for 10 days. I think this was in response to culture that showed group G strep although I'm not exactly sure where the culture was from. She is also had arterial studies on 03/29/15. This showed a right ABI of 1.4 that was noncompressible. Her left ABI was 0.73. 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 It was not felt that arterial inflow was adequate for healing of a foot ulcer. Her Doppler waveforms looked monophasic ===== READMISSION 02/28/17; this is in an now 49 year old woman we've had at several different occasions in this clinic. 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 She has a remote ex-smoker. She was last seen in this clinic by Dr. Con Memos I think in May. More recently she is been followed by her podiatrist [name] an infectious disease Dr. Megan Salon. She has 2 open wounds the major one is over the right first metatarsal head she also has a wound on the left plantar foot. an MRI of the right foot on 01/01/17 showed a soft tissue ulcer along the plantar aspect of the first metatarsal base consistent with osteomyelitis of the first metatarsal stump. Dr. 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 According to the patient this is been open for slightly over a month. She has been on a combination of Cipro 500 twice a  day, Zyvox 600 twice a day and Flagyl 500 3 times a day for over a month now as directed by Dr. Megan Salon. cultures of the right foot earlier this year showed MRSA in January and Serratia in May. January also had a few viridans strep. Recent x-rays of both feet were done and [name] office and I don't have these reports. The patient has known PAD and has a history of aleft femoropopliteal bypass in April 2017. 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 Dwyane Dee. In spite of this the last hemoglobin A1c I can see is 10.1 on 01/01/2017. She is being referred by 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 She is been using Medihoney to this area and also an area on the left midfoot. She is using healing sandals bilaterally. ABIs in this clinic at the left posterior tibial was 1.1 noncompressible on the right READMISSION Non invasive vascular NOVANT 5/18 Aftercare following surgery of the circulatory system Procedure Note - Interface, External Ris In - 08/13/2016 11:05 AM EDT Procedure: Examination consists of physiologic resting arterial pressures of the brachial and ankle arteries bilaterally with continuous wave Doppler waveform analysis. Previous: Previous exam performed on 02/14/16 demonstrated ABIs of Rt = 1.19 and Lt = 1.33. Right: ABI = non-compressible  PT, 1.47 DP. S/P transmet amputation. Left: ABI = 1.52, 2nd digit pressure = 87 mmHg Conclusions: Right: ABI (>1.3) may be falsely elevated, suggesting medial calcification. Left: ABI (>1.3) may be falsely elevated, suggesting medial calcification The patient is a now 49 year old type I diabetic is had multiple issues her graded to chronic diabetic foot ulcers. She has had a previous right  transmetatarsal amputation fifth ray amputation. She had Charcot feet diabetic polyneuropathy. We had her in the clinic lastin November. At that point she had wounds on her bilateral feet.she had wanted to try hyperbarics however the healogics review process denied her because she hadn't followed up with her vascular surgeon for her left femoropopliteal bypass. The bypass was done by Dr. Raul Del at [facility]. We made her a follow-up with Dr. Raul Del however she did not keep the appointment and therefore she was not approved The patient shows me a small wound on her left fourth metatarsal head on her phone. She developed rapid discoloration in the plantar aspect of the left foot and she was admitted to hospital from 2/2 through 05/10/17 with wet gangrene of the left foot osteomyelitis of the fourth metatarsal heads. She was admitted acutely ill with a temperature of 103. She was started on broad-spectrum vancomycin and cefepime. On 05/06/17 she was taken to the OR by [name] her podiatric surgeon for an incision and drainage irrigation of the left foot wound. Cultures from this surgery revealed group be strep and anaerobes. she was seen by Dr.Xu of orthopedic surgery and scheduled for a below-knee amputation which she u refused. Ultimately she was discharged on Levaquin and Flagyl for one month. MRI 05/05/17 done while she was in the hospital showed abscess adjacent to the fourth metatarsal head and neck small abscess around the fourth flexor tendon. Inflammatory phlegmon and gas in the soft tissues along the lateral aspect of the fourth phalanx. Findings worrisome for osteomyelitis involving the fourth proximal and middle phalanx and also the third and fourth metatarsals. Finally the patient had actually shortly before this followed up with Dr. Raul Del at no time on 04/29/17. He felt that her left femoropopliteal bypass was patent he felt that her left-sided toe pressures more than adequate for  healing a wound on the left foot. This was before her acute presentation. 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 05/28/17; she is started hyperbarics. The patient tells me that for some reason she was not actually on Levaquin but I think on ciprofloxacin. She was on Flagyl. She only started her Levaquin yesterday due to some difficulty with the pharmacy and perhaps her sister picking it up. She has an appointment with [name] tomorrow and with infectious disease early next week. She has no new complaints 06/06/17; the patient continues in hyperbarics. She saw [name] on 05/29/17 who is her podiatric surgeon. He is elected for a transmetatarsal amputation on 06/27/17. I'm not sure at what level he plans to do this amputation. The patient is unaware She also saw Dr. Megan Salon of infectious disease who elected to continue her on current antibiotics I think this is ciprofloxacin and Flagyl. I'll need to clarify with her tomorrow if she actually has this. We're using silver alginate to the actual wound. Necrotic surface today with material under the flap of her foot. Original MRI showed abscesses as well as osteomyelitis of the proximal and middle fourth phalanx and the third and fourth metatarsal heads 06/11/17; patient continues in hyperbarics and continues on oral antibiotics. She is doing well.  The wound looks better. The necrotic part of this under the flap in her superior foot also looks better. she is been to see [name]. I haven't had a chance to look at his note. Apparently he has put the transmetatarsal amputation on hold her request it is still planning to take her to the OR for debridement and product application ACEL. I'll see if I can find his note. I'll therefore leave product ordering/requests to [name] for now. I was going to look at Dermagraft 06/18/17-she is here in follow-up evaluation for bilateral foot wounds. She continues with hyperbaric therapy. She states she has been  applying manuka honey to the right plantar foot and alternate manuka honey and silver alginate to the left foot, despite our orders. We will continue with same treatment plan and she will follo up next week. 06/25/17; I have reviewed [name] last note from 3/11. She has operative debridement in 2 days' time. By review his note apparently they're going to place there is skin over the majority of this wound which is a good choice. She has a small satellite area at the most proximal part of this wound on the left plantar foot. The area on the right plantar foot we've been using silver alginate and it is close to healing. 07/02/17; unfortunately the patient was not easily approved for [name] proposed surgery. I'm not completely certain what the issue is. She has been using silver alginate to the wound she has completed a first course of hyperbarics. She is still on Levaquin and Flagyl. I have really lost track of the time course here.I suspect she should have another week to 2 of antibiotics. I'll need to see if she is followed up with infectious disease Dr. Megan Salon 07/09/17; the patient is followed up with Dr. Megan Salon. 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 She continues on Levaquin and metronidazole continuing both of these for now I think she is been on fr about 6 weeks. She still has some drainage but no pain. No fever. Her had been plans for her to go to the OR for operative debridement with her podiatrist [name], I am not exactly sure where that is. I'll probably slip a note to [name] today. I note that she follows with Dr. Dwyane Dee of endocrinology. We have her recertified for hyperbaric oxygen. I have not heard about Dermagraft however I'll see if [name] is planning a skin substitute as well 07/16/17; the patient tells me she is just about out of [place]. I'll need to check Dr. Hale Bogus last notes on this. She states she has plenty of  Flagyl however. She comes in today complaining of pain in the right lateral foot which she said lasted for about a day. The wound on the right foot is actually much more medially. She also tells me that the [facility] cost a lot of pain in the left foot wound and she turned back to silver alginate. Finally Dermagraft has a $063 per application co-pay. She cannot afford this 07/23/17; patient arrives today with the wound not much smaller. There is not much new to add. She has not heard from [name] all try to put in a call to them today. She was asking about Dermagraft again and she has an over $016 per application co-pay she states that she would be willing to try to do a payment plan. I been tried to avoid this. We've  been using silver alginate, I'll change to [employer] 07/30/17-She is here in follow-up evaluation for left foot ulcer. She continues hyperbaric medicine. The left foot ulcer is stable we will continue with same treatment plan 08/06/17; she is here for evaluation of her left foot ulcer. Currently being treated for hyperbarics or underlying osteomyelitis. She is completed antibiotics. The left foot ulcer is better smaller with healthier looking granulation. For various reasons I am not really clear on we never got her back to the OR with [name]. He did not respond to my secure text message. Nevertheless I think that surgery on this point is not necessary nor am I completely clear that a skin substitute is necessary The patient is complaining about pain on the outside of her right foot. 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 There is no erythema. She also states the foot is warm versus her other part of her upper leg and this is largely true. It is not totally clear to me what's causing this. She thinks it's different from her usual neuropathy pain 08/13/17; she arrives in clinic today with a small wound which is superficial on her right first metatarsal head.  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 She tells [place] she was up on her feet over the Mother's Day celebration. The large wound is on the left foot. Continues with hyperbarics for underlying osteomyelitis. We're using Hydrofera Blue. She asked me today about where we were with Dermagraft. I had actually excluded this because of the co- pay however she wants to assume this therefore I'll recheck the co-pay an order for next week. 08/20/17; the patient agreed to accept the co-pay of the first Apligraf which we applied today. She is disappointed she is finishing hyperbarics will run this through the insurance on the extent of the foot infection and the extent of the wound that she had however she is already had 60 dive's. Dermagraft No. 1 08/27/17; Dermagraft No. 2. She is not eligible for any more hyperbaric treatments this month. She reports a fair amount of drainage and she actually changed to the external dressings without disturbing the direct contact layer 09/03/17; the patient arrived in clinic today with the wound superficially looking quite healthy. Nice vibrant red tissue with some advancing epithelialization although not as much adherence of the flap as I might like. However she noted on her own fourth toe some bogginess and she brought that to our attention. Indeed this was boggy feeling like a possibility of subcutaneous fluid. 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 She is not been unwell. We've been using Dermagraft 09/10/17; the culture that I did not last week was MRSA. She saw Dr. Megan Salon this morning who is going to start her on vancomycin. I had sent him a secure a text message yesterday. I also spoke with her podiatric surgeon [name] about surgery on this foot the options for conserving a functional foot etc. Promised me he would see her and will make back consultation today.  Paradoxically her actual wound on the plantar aspect of her left foot looks really quite good. I had given her 5 days worth of Baxdella to cover her for MRSA. Her MRI came back showing osteomyelitis within the third metatarsal shaft and head and base of the third and fourth proximal phalanx. She had extensive inflammatory changes throughout the soft tissue of the lateral  forefoot. With an ill-defined fluid around the fourth metatarsal extending into the plantar and dorsal soft tissues 09/19/17; the patient is actually on oral Septra and Flagyl. She apparently refused IV vancomycin. She also saw [name] at my request who is planning her for a left BKA sometime in mid July. MRI showed osteomyelitis within the third metatarsal shaft and head and the basis of the third and fourth proximal phalanx. I believe there was felt to be possible septic arthritis involving the third MTP. 09/26/17; the patient went back to Dr. Megan Salon at my suggestion and is now receiving IV daptomycin. Her wound continues to look quite good making the decision to proceed with a transmetatarsal amputation although more difficult for the patient. I believe in my extensive discussions with her she has a good sense of the pros and cons of this. I don't NV the tuft decision she has to make. She has an appointment with [name] I believe in mid July and I previously spoken to him about this issue Has we had used 3 previous Dermagraft. Given the condition of the wound surface I went ahead and added the fourth one today area and I did this not fully realizing that she'll be traveling to [place] next week. I'm hopeful she can come back in 2 weeks 10/21/17; Her same Dermagraft on for about 3-1/2 weeks. In spite of this the wound arrives looking quite healthy. There is been a lot of healing dimensions are smaller. Looking at the square shaped wound she has now there is some undermining and some depth medially under the undermining  although I cannot palpate any bone. No surrounding infection is obvious. She has difficult questions about how to look at this going forward vis--vis amputations versus continued medical therapy. To be truthful the wound is looks so healthy and it is continued to contract. Hard to justify foot surgery at this point although I still told her that I think it might come to that if we are not able to eradicate the underlying MRSA. She is still highly at risk and she understands this 11/06/17 on evaluation today patient appears to be doing better in regard to her foot ulcer. She's been tolerating the dressing changes without complication. Currently she is here for her Dermagraft #6. Her wound continues to make excellent progress at this point. She does not appear to have any evidence of infection which is good news. 11/13/17 on evaluation today patient appears to be doing excellent at this time. She is here for repeat Dermagraft application. This is #7. Overall her wound seems to be making great progress. 12/05/17; the patient arrives with the wound in much better condition than when I last saw this almost 6 weeks ago. She still has a small probing area in the left metatarsal head region on the lateral aspect of her foot. We applied her last Dermagraft today. Since the last time she is here she has what appears to a been a blood blister on the plantar aspect of left foot although I don't see this is threatening. There is also a thick raised tissue on the right mid metatarsal head region. This was not there I don't think the last time she was here 3 weeks ago. 12/12/17; the patient continues to have a small programming area in the left metatarsal head region on the lateral aspect of her foot which was the initial large surgical wound. I applied her last Apligraf last week. I'm going to use Endoform starting today Unfortunately  she has an excoriated area in the left mid foot and the right mid foot. The left  midfoot looks like a blistered area this was not opened last week it certainly is open today. Using silver alginate on these areas. She promises me she is offloading this. 12/19/17; the small probing area in the left metatarsal head eyes think is shallower. In general her original wound looks better. We've been using Endoform. The area inferiorly that I think was trauma last week still requires debridement a lot of nonviable surface which I removed. She still has an open open area distally in her foot Similarly on the right foot there is tightly adherent surface debris which I removed. Still areas that don't look completely epithelialized. This is a small open area. We used silver alginate on these areas 12/26/2017; the patient did not have the supplies we ordered from last week including the Endoform. The original large wound on the left lateral foot looks healthy. She still has the undermining area that is largely unchanged from last week. She has the same heavily callused raised edged wounds on the right mid and left midfoot. Both of these requiring debridement. We have been using silver alginate on these areas 01/02/2018; there is still supply issues. We are going to try to use Prisma but I am not sure she actually got it from what she is saying. She has a new open area on the lateral aspect of the left fourth toe [previous fifth ray amputation]. Still the one tunneling area over the fourth metatarsal head. The area is in the midfoot bilaterally still have thick callus around them. She is concerned about a raised swelling on the lateral aspect of the foot. However she is completely insensate 01/10/2018; we are using Prisma to the wounds on her bilateral feet. Surprisingly the tunneling area over the left fourth metatarsal head that was part of her original surgery has closed down. She has a small open area remaining on the incision line. 2 open areas in the midfoot. 02/10/2018; the patient  arrives back in clinic after a month hiatus. She was traveling to visit family in [place]. Is fairly clear she was not offloading the areas on her feet. The original wound over the left lateral foot at the level of metatarsal heads is reopened and probes medially by about a centimeter or 2. She notes that a week ago she had purulent drainage come out of an area on the left midfoot. Paradoxically the worst area is actually on the right foot is extensive with purulent drainage. We will use silver alginate today 02/17/2018; the patient has 3 wounds one over the left lateral foot. She still has a small area over the metatarsal heads which is the remnant of her original surgical wound. This has medial probing depth of roughly 1.4 cm somewhat better than last week. The area on the right foot is larger. We have been using silver alginate to all areas. The area on the right foot and left foot that we cultured last week showed both Klebsiella and Proteus. Both of these are quinolone sensitive. The patient put her's self on Bactrim and Flagyl that she had left hanging around from prior antibiotic usages. She was apparently on this last week when she arrived. I did not realize this. Unfortunately the Bactrim will not cover either 1 of these organisms. We will send in Cipro 500 twice daily for a week 03/04/2018; the patient has 2 wounds on the left foot one is the  original wound which was a surgical wound for a deep DFU. At one point this had exposed bone. She still has an area over the fourth metatarsal head that probes about 1.4 cm although I think this is better than last week. I been using silver nitrate to try and promote tissue adherence and been using silver alginate here. She also has an area in the left midfoot. This has some depth but a small linear wound. Still requiring debridement. On the right midfoot is a circular wound. A lot of thick callus around this area. We have been using silver alginate  to all wound areas She is completed the ciprofloxacin I gave her 2 weeks ago. 03/11/2018; the patient continues to have 2 open areas on the left foot 1 of which was the original surgical wound for a deep DFU. Only a small probing area remains although this is not much different from last week we have been using silver alginate. The other area is on the midfoot this is smaller linear but still with some depth. We have been using silver alginate here as well On the right foot she has a small circular wound in the mid aspect. This is not much smaller than last time. We have been using silver alginate here as well 03/18/2018; she has 3 wounds on the left foot the original surgical wound, a very superficial wound in the mid aspect and then finally the area in the mid plantar foot. She arrives in today with a very concerning area in the wound in the mid plantar foot which is her most proximal wound. There is undermining here of roughly 1-1/2 cm superiorly. Serosanguineous drainage. She tells me she had some pain on for over the weekend that shot up her foot into her thigh and she tells me that she had a nodule in the groin area. She has the single wound in the right foot. We are using endoform to both wound areas 03/24/2018; the patient arrives with the original surgical wound in the area on the left midfoot about the same as last week. There is a collection of fluid under the surface of the skin extending from the surgical wound towards the midfoot although it does not reach the midfoot wound. The area on the right foot is about the same. Cultures from last week of the left midfoot wound showed abundant Klebsiella abundant Enterococcus faecalis and moderate methicillin resistant staph I gave her Levaquin but this would have only covered the Klebsiella. She will need linezolid 04/01/2018; she is taking linezolid but for the first few days only took 1 a day. I have advised her to finish this at twice  daily dosing. In any case all of her wounds are a lot better especially on the left foot. The original surgical wound is closed. The area on the left midfoot considerably smaller. The area on the right foot also smaller. 04/08/2018; her original surgical wound/osteomyelitis on the left foot remains closed. She has area on the left foot that is in the midfoot area but she had some streaking towards this. This is not connected with her original wound at least not visually. Small wound on the right midfoot appears somewhat smaller. 04/15/18; both wounds looks better. Original wound is better left midfoot. Using silver alginate 1/21; patient states she uses saltwater soak in, stones or remove callus from around her wounds. She is also concerned about a blood blister she had on the left foot but it simply resolved on its own.  We've been using silver alginate 1/28; the patient arrives today with the same streaking area from her metatarsals laterally [the site of her original surgical wound] down to the middle of her foot. There is some drainage in the subcutaneous area here. This concerns me that there is actually continued ongoing infection in the metatarsals probably the fourth and third. This fixates an MRI of the foot without contrast [chronic renal failure] The wound in the mid part of the foot is small but I wonder whether this area actually connects with the more distal foot. The area on the right midfoot is probably about the same. Callus thick skin around the small wound which I removed with a curette we have been using silver alginate on both wound areas 2/4; culture I did of the draining site on the left foot last time grew methicillin sensitive staph aureus. MRI of the left foot showed interval resolution of the findings surrounding the third metatarsal joint on the prior study consistent with treated osteomyelitis. Chronic soft tissue ulceration in the plantar and lateral aspect of the  forefoot without residual focal fluid collection. No evidence of recurrent osteomyelitis. 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 She tells me she had nausea and vomiting over the weekend and missed 2 days. In general her foot looks much the same. She has a small open area just below the left fourth metatarsal head. A linear area in the left midfoot. Some discoloration extending from the inferior part of this into the left lateral foot although this appears to be superficial. She has a small area on the right midfoot which generally looks smaller after debridement 2/18; the patient is completing his cephalexin and has another 2 days. She continues to have open areas on the left and right foot. 2/25; she is now off antibiotics. The area on the left foot at the site of her original surgical wound has closed yet again. She still has open areas in the mid part of her foot however these appear smaller. The area on the right mid foot looks about the same. We have been using silver alginate She tells me she had a serious hypoglycemic spell at home. She had to have EMS called and get IV dextrose 3/3; disappointing on the left lateral foot large area of necrotic tissue surrounding the linear area. This appears to track up towards the same original surgical wound. Required extensive debridement. The area on the right plantar foot is not a lot better also using silver 3/12; the culture I did last time showed abundant enterococcus. I have prescribed Augmentin, should cover any unrecognized anaerobes as well. In addition there were a few MRSA and Serratia that would not be well covered although I did not want to give her multiple antibiotics. She comes in today with  a new wound in the right midfoot this is not connected with the original wound over her MTP a lot of thick callus tissue around both wounds but once again she said she is not walking on these areas 3/17-Patient comes in for follow-up on the bilateral plantar wounds, the right midfoot and the left plantar wound. Both these are heavily callused surrounding the wounds. We are continuing to use silver alginate, she is compliant with offloading  and states she uses a wheelchair fairly often at home 3/24; both wound areas have thick callus. However things actually look quite a bit better here for the majority of her left foot and the right foot. 3/31; patient continues to have thick callused somewhat irritated looking tissue around the wounds which individually are fairly superficial. There is no evidence of surrounding infection. We have been using silver alginate however I change that to [facility] today 4/17; patient returns to clinic after having a scare with Covid she tested negative in her primary doctor's office. She has been using Hydrofera Blue. She does not have an open area on the right foot. On the left foot she has a small open area with the mid area not completely viable. She showed me pictures of what looks like a hemorrhagic blister from several days ago but that seems to have healed over this was on the lateral left foot 4/21; patient comes in to clinic with both her wounds on her feet closed. However over the weekend she started having pain in her right foot and leg up into the thigh. She felt as though she was running a low-grade fever but did not take her temperature. She took a doxycycline that she had leftover and yesterday a single Septra and metronidazole. She thinks things feel somewhat better. 4/28; duplex ultrasound I ordered last week was negative for DVT or superficial thrombophlebitis. She is completed the doxycycline I gave her. States she is still having a lot of pain  in the right calf and right ankle which is no better than last week. She cannot sleep. She also states she has a temperature of up to 101, coughing and complaining of visual loss in her bilateral eyes. Apparently she was tested for Covid 2 weeks ago at [facility] and that was negative. Readmission: 09/03/18 patient presents back for reevaluation after having been evaluated at the end of April regarding erythema and swelling of her right lower extremity. Subsequently she ended up going to the hospital on 07/29/18 and was admitted not to be discharged until 08/08/18. Unfortunately it was noted during the time that she was in the hospital that she did have methicillin-resistant Staphylococcus aureus as the infection noted at the site. It was also determined that she did have osteomyelitis which appears to be fairly significant. She was treated with vancomycin and in fact is still on IV vancomycin at dialysis currently. This is actually slated to continue until 09/12/18 at least which will be the completion of the six weeks of therapy. Nonetheless based on what I'm seeing at this point I'm not sure she will be anywhere near ready to discontinue antibiotics at that time. Since she was released from the hospital she was seen by [name] who is her podiatrist on 08/27/18. His note specifically states that he is recommended that the patient needs of one knee amputation on the right as she has a life-threatening situation that can lead quickly to sepsis. The patient advised she would like to try to save her leg to which [name] apparently told her that this was against all medical advice. She also want to discontinue the Wound VAC which had been initiated due to the fact that she wasn't pleased with how the wound was looking and subsequently she wanted to pursue applying Medihoney at that time. He stated that he did not believe that the right lower extremity was salvageable and that the patient understood but  would still like to attempt hyperbaric  option therapy if it could be of any benefit. She was therefore referred back to [place] for further evaluation. He plans to see her back next week. Upon inspection today patient has a significant amount purulent drainage noted from the wound at this point. The bone in the distal portion of her foot also appears to be extremely necrotic and spongy. When I push down on the bone it bubbles and seeps purulent drainage from deeper in the end of the foot. I do not think that this is likely going to heal very well at all and less aggressive surgical debridement were undertaken more than what I believe we can likely do here in our office. 09/12/2018; I have not seen this patient since the most recent hospitalization although she was in our clinic last week. I have reviewed some of her records from a complex hospitalization. 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 There is situation was complicated by MRSA bacteremia and acute on chronic renal failure now on dialysis. She is receiving vancomycin at dialysis. We started her on Dakin's wet-to- dry last week she is changing this daily. There is still purulent drainage coming out of her foot. Although she is apparently "agreeable" to a below-knee amputation which is been suggested by multiple clinicians she wants this to be done in [place]. She apparently has a telehealth visit with that provider sometime in late [place] 6/24. I have told her I think this is probably too long. Nevertheless I could not convince her to allow a local doctor to perform BKA. 09/19/2018; the patient has a large necrotic area on the right anterior foot. She has had previous transmetatarsal amputations. Culture I did last week showed MRSA nothing else she is on vancomycin at dialysis. She has continued leaking purulent drainage out of the distal part of the large circular wound on the right anterior  foot. She apparently went to see Dr. 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 Somehow that translated into her being referred to plastic surgery for debridement of the area. I gather she basically refused amputation although I do not have a copy of Dr. Berenice Primas notes. The patient really wants to have a trial of hyperbaric oxygen. I agreed with initial assessment in this clinic that this was probably too far along to benefit however if she is going to have plastic surgery I think she would benefit from ancillary hyperbaric oxygen. The issue here is that the patient has benefited as maximally as any patient I have ever seen from hyperbaric oxygen therapy. Most recently she had exposed bone on the lateral part of her left foot after a surgical procedure and that actually has closed. She has eschared areas in both heels but no open area. She is remained systemically well. I am not optimistic that anything can be done about this but the patient is very clear that she wants an attempt. The attempt would include a wound VAC further debridements and hyperbaric oxygen along with IV antibiotics. 6/26; I put her in for a trial of hyperbaric oxygen only because of the dramatic response she has had with wounds on her left midfoot earlier this year which was a surgical wound that went straight to her bone over the metatarsal heads and also remotely the left third toe. We will see if we can get this through our review process and insurance. She arrives in clinic with again purulent material pouring out of necrotic bone  on the top of the foot distally. There is also some concerning erythema on the front of the leg that we marked. It is bit difficult to tell how tender this is because of neuropathy. I note from infectious disease that she had her vancomycin extended. 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 She had the wound VAC on for part of the week.  The rest of the time she is putting various things on this including Medihoney, "ionized water" silver sorb gel etc. 7/7; follow-up along with HBO. She is still on vancomycin at dialysis. She has a large open area on the dorsal right foot and a small dark eschar area on her heel. There is a lot less erythema in the area and a lot less tenderness. From an infection point of view I think this is better. She still has a lot of necrosis in the remaining right forefoot [previous TMA] we are still using the wound VAC in this area 7/16; follow-up along with HBO. I put her on linezolid after she finished her vancomycin. We started this last Friday I gave her 2 weeks worth. I had the expectation that she would be operatively debrided by Dr. Marla Roe but that still has not happened yet. Patient phoned the office this week. She arrives for review today after HBO. The distal part of this wound is completely necrotic. Nonviable pieces of tendon bone was still purulent drainage. Also concerning that she has black eschar over the heel that is expanding. I think this may be indicative of infection in this area as well. She has less erythema and warmth in the ankle and calf but still an abnormal exam 7/21 follow-up along with HBO. I will renew her linezolid after checking a CBC with differential monitoring her blood counts especially her platelets. She was supposed to have surgery yesterday but if I am reading things correctly this was canceled after her blood sugar was found to be over 500. I thought Dr. Marla Roe who called me said that they were sending her to the ER but the patient states that was not the case. 7/28. Follow-up along with HBO. She is on linezolid I still do not have any lab work from dialysis even though I called last week. The patient is concerned about an area on her left lateral foot about the level of the base of her fifth metatarsal. I did not really see anything that ominous here  however this patient is in [place] ability to point out problems that she is sensing and she has been accurate in the past Finally she received a call from Dr. Marla Roe who is referring her to another orthopedic surgeon stating that she is too booked up to take her to the operating room now. Was still using a wound VAC on the foot 8/3 -Follow-up after HBO, she is got another week of linezolid, she is to call ID for an appointment, x-rays of both feet were reviewed, the left foot x-ray with third MTP joint osteo- Right foot x-ray widespread osteo-in the right midfoot Right ankle x-ray does not show any active evidence of infection 8/11-Patient is seen after HBO, the wounds on the right foot appear to be about the same, the heel wound had some necrotic base over tendon that was debrided with a curette 8/21; patient is seen after HBO. The patient's wound on her dorsal foot actually looks reasonably good and there is substantial amount of epithelialization however the open area distally still  has a lot of necrotic debris partially bone. I cannot really get a good sense of just how deep this probes under the foot. She has been pressuring me this week to order medical maggots through a company in [place] for her. The problem I have is there is not a defined wound area here. On the positive side there is no purulence. She has been to see infectious disease she is still on Septra DS although I have not had a chance to review their notes 8/28; patient is seen in conjunction with HBO. The wounds on her foot continued to improve including the right dorsal foot substantially the, the distal part of this wound and the area on the right heel. We have been using a wound VAC over this chronically. She is still on trimethoprim as directed by infectious disease 9/4; patient is seen in conjunction with HBO. Right dorsal foot wound substantially anteriorly is better however she continues to have a deep wound in  the distal part of this that is not responding. We have been using silver collagen under border foam Area on the right plantar medial heel seems better. We have been using Hydrofera Blue 12/12/18 on evaluation today patient appears to be doing about the same with regard to her wound based on prior measurements. She does have some necrotic tissue noted on the lateral aspect of the wound that is going require a little bit of sharp debridement today. This includes what appears to be potentially either severely necrotic bone or tendon. Nonetheless other than that she does not appear to have any severe infection which is good news 9/18; it is been 2 weeks since I saw this wound. She is tolerating HBO well. Continued dramatic improvement in the area on the right dorsal foot. She still has a small wound on the heel that we have been using Hydrofera Blue. She continues with a wound VAC 9/24; patient has to be seen emergently today with a swelling on her right lateral lower leg. She says that she told Dr. Evette Doffing about this and also myself on a couple of occasions but I really have no recollection of this. She is not systemically unwell and her wound really looked good the last time I saw this. She showed this to providers at dialysis and she was able to verify that she was started on cephalexin today for 5 doses at dialysis. She dialyzes on Tuesday Thursday and Saturday. 10/2; patient is seen in conjunction with HBO. The area that is draining on the right anterior medial tibia is more extensive. Copious amounts of serosanguineous drainage with some purulence. We are still using the wound VAC on the original wound then it is stable. Culture I did of the original IandD showed MRSA I contacted dialysis she is now on vancomycin with dialysis treatments. I asked them to run a month 10/9; patient seen in conjunction with HBO. She had a new spontaneous open area just above the wound on the right medial tibia  ankle. More swelling on the right medial tibia. Her wound on the foot looks about the same perhaps slightly better. There is no warmth spreading up her leg but no obvious erythema. her MRI of the foot and ankle and distal tib-fib is not booked for next Friday I discussed this with her in great detail over multiple days. it is likely she has spreading infection upper leg at least involving the distal 25% above the ankle. She knows that if I refer her to orthopedics  for infectious disease they are going to recommend amputation and indeed I am not against this myself. We had a good trial at trying to heal the foot which is what she wanted along with antibiotics debridement and HBO however she clearly has spreading infection [probably staph aureus/MRSA]. Nevertheless she once again tells me she wants to wait the left of the MRI. She still makes comments about having her amputation done in [place]. 10/19; arrives today with significant swelling on the lateral right leg. Last culture I did showed Klebsiella. Multidrug- resistant. Cipro was intermediate sensitivity and that is what I have her on pending her MRI which apparently is going to be done on Thursday this week although this seems to be moving back and forth. She is not systemically unwell. We are using silver alginate on her major wound area on the right medial foot and the draining areas on the right lateral lower leg 10/26; MRI showed extensive abscess in the anterior compartment of the right leg also widespread osteomyelitis involving osseous structures of the midfoot and portions of the hindfoot. Also suspicion for osteomyelitis anterior aspect of the distal medial malleolus. Culture I did of the purulence once again showed a multidrug-resistant Klebsiella. I have been in contact with nephrology late last week and she has been started on cefepime at dialysis to replace the vancomycin We sent a copy of her MRI report to Dr. Geroge Baseman in  [place] who is an orthopedic surgeon. The patient takes great stock in his opinion on this. She says she will go to [place] to have her leg amputated if Dr. Geroge Baseman does not feel there is any salvage options. 11/2; she still is not talk to her orthopedic surgeon in [place]. Apparently he will call her at [phone] this afternoon. The quality of this is she has not allowed me to refer her anywhere. She has been told over and over that she needs this amputated but has not agreed to be referred. She tells me her blood sugar was 600 last night but she has not been febrile. 11/9; she never did got a call from the orthopedic surgeon in [place] therefore that is off the radar. We have arranged to get her see orthopedic surgery at [facility] She still has a lot of draining purulence coming out of the new abscess in her right leg although that probably came from the osteomyelitis in her right foot and heel. Meanwhile the original wound on the right foot looks very healthy. Continued improvement. The issue is that the last MRI showed osteomyelitis in her right foot extensively she now has an abscess in the right anterior lower leg. There is nobody in [place] who will offer this woman anything but an amputation and to be honest that is probably what she needs. I think she still wants to talk about limb salvage although at this point I just do not see that. She has completed her vancomycin at dialysis which was for the original staph aureus she is still on cefepime for the more recent Klebsiella. She has had a long course of both of these antibiotics which should have benefited the osteomyelitis on the right foot as well as the abscess. 11/16; apparently [place] elective surgery is shut down because of COVID-19 pandemic. I have reached out to some contacts at [facility] to see if we can get her an orthopedic appointment there. I am concerned about continually leaving this but for the moment  everything is static. In fact her original large wound on  this foot is closing down. It is the abscess on the right anterior leg that continues to drain purulent serosanguineous material. She is not currently on any antibiotics however she had a prolonged course of vancomycin [1 month] as well as cefepime for a month 02/24/2019 on evaluation today patient appears to be doing better than the last time I saw her. This is not a patient that I typically see. With that being said I am covering for Dr. Dellia Nims this week and again compared to when I last saw her overall the wounds in particular seem to be doing significantly better which is good news. With that being said the patient tells me several disconcerting things. She has not been able to get in to see anyone for potential debridement in regard to her leg wounds although she tells me that she does not think it is necessary any longer because she is taking care of that herself. She noticed a string coming out of the lower wound on her leg over the last week. The patient states that she subsequently decided that we must of pack something in there and started pulling the string out and as it kept coming and coming she realized this was likely her tendon. With that being said she continued to remove as much of this as she could. She then I subsequently proceeded to using tubes of antibiotic ointment which she will stick down into the wound and then scored as much as she can until she sees it coming out of the other wound opening. She states that in doing this she is actually made things better and there is less redness and irritation. With regard to her foot wound she does have some necrotic tendon and tissue noted in one small corner but again the actual wound itself seems to be doing better with good granulation in general compared to my last evaluation. 12/7; continued improvement in the wound on the substantial part of the right medial foot. Still  a necrotic area inferiorly that required debridement but the rest of this looks very healthy and is contracting. She has 2 wounds on the right lateral leg which were her original drainage sites from her abscess but all of this looks a lot better as well. She has been using silver alginate after putting antibiotic biotic ointment in one wound and watching it come out the other. I have talked to her in some detail today. I had given her names of orthopedic surgeons at [facility] for second opinion on what to do about the right leg. I do not think the patient never called them. She has not been able to get a hold of the orthopedic surgeon in [place] that she had put a lot of faith in as being somebody would give her an opinion that she would trust. I talked to her today and said even if I could get her in to another orthopedic surgeon about the leg which she accept an amputation and she said she would not therefore I am not going to press this issue for the moment 12/14; continued improvement in his substantial wound on the right medial foot. There is still a necrotic area inferiorly with tightly adherent necrotic debris which I have been working on debriding each time she is here. She does not have an orthopedic appointment. Since last time she was here I looked over her cultures which were essentially MRSA on the foot wound and gram- negative rods in the abscess on the anterior leg.  12/21; continued improvement in the area on the right medial foot. She is not up on this much and that is probably a good thing since I do not know it could support continuous ambulation. She has a small area on the right lateral leg which were remanence of the IandD's I did because of the abscess. I think she should probably have prophylactic antibiotics I am going to have to look this over to see if we can make an intelligent decision here. In the meantime her major wound is come down nicely. Necrotic area  inferiorly is still there but looks a lot better 04/06/2019; she has had some improvement in the overall surface area on the right medial foot somewhat narrowedr both but somewhat longer. The areas on the right lateral leg which were initial IandD sites are superficial. Nothing is present on the right heel. We are using silver alginate to the wound areas 1/18; right medial foot somewhat smaller. Still a deep probing area in the most distal recess of the wound. She has nothing open on the right leg. She has a new wound on the plantar aspect of her left fourth toe which may have come from just pulling skin. The patient using Medihoney on the wound on her foot under silver alginate. I cannot discourage her from this 2/1; 2-week follow-up using silver alginate on the right foot and her left fourth toe. The area on the right dorsal foot is contracted although there is still the deep area in the most distal part of the wound but still has some probing depth. No overt infection 2/15; 2-week follow-up. She continues to have improvement in the surface area on the dorsal right foot. Even the tunneling area from last time is almost closed. The area that was on the plantar part of her left fourth toe over the PIP is indeed closed 3/1; 2-week follow-up. Continued improvement in surface area. The original divot that we have been debriding inferiorly I think has full epithelialization although the epithelialization is gone down into the wound with probably 4 mm of depth. Even under intense illumination I am unable to see anything open here. The remanence of the wound in this area actually look quite healthy. We have been using silver alginate 3/15; 2-week follow-up. Unfortunately not as good today. She has a comma shaped wound on the dorsal foot however the upper part of this is larger. Under illumination debris on the surface She also tells [place] that she was on her right leg 2 times in the last couple of weeks  mostly to reach up for things above her head etc. She felt a sharp pain in the right leg which she thinks is somewhere from the ankle to the knee. The patient has neuropathy and is really uncertain. She cannot feel her foot so she does not think it was coming from there 3/29; 2-week follow-up. Her wound measures smaller. Surface of the wound appears reasonable. She is using silver alginate with underlying Medihoney. She has home health. X-rays I did of her tib-fib last time were negative although it did show arterial calcification 4/12; 2-week follow-up. Her wound measures smaller in length. Using manuka honey with silver alginate on top. She has home health. Electronic Signature(s) Signed: 07/14/2019 5:51:00 PM By: Linton Ham MD Entered By: Linton Ham on 07/13/2019 13:26:04 -------------------------------------------------------------------------------- Physical Exam Details Patient Name: Date of Service: [name] 07/13/2019 12:45 PM Medical Record Number:[id number] Patient Account Number: [id number] Date of Birth/Sex: Treating RN: 1970-08-14 (48  y.o. Nancy Fetter Primary Care Provider: [name] Other Clinician: Referring Provider: Treating Provider/Extender:Krystale Rinkenberger, [name], [name] in Treatment: 61 Constitutional Patient is hypotensive.. Pulse regular and within target range for patient.Marland Kitchen Respirations regular, non-labored and within target range.. Temperature is normal and within the target range for the patient.Marland Kitchen Appears in no distress. Notes Wound exam; the wound measures slightly smaller in length. Under illumination of fibrinous surface. Use an open curette for debridement post treatment this looked improved. Pedal pulses are palpable in the right foot Electronic Signature(s) Signed: 07/14/2019 5:51:00 PM By: Linton Ham MD Entered By: Linton Ham on 07/13/2019  13:26:55 -------------------------------------------------------------------------------- Physician Orders Details Patient Name: Date of Service: [name] 07/13/2019 12:45 PM Medical Record Number:[id number] Patient Account Number: [id number] Date of Birth/Sex: Treating RN: 28-Apr-1970 (49 y.o. Nancy Fetter Primary Care Provider: [name] Other Clinician: Referring Provider: Treating Provider/Extender:Berklee Battey, [name], [name] in Treatment: 84 Verbal / Phone Orders: No Diagnosis Coding ICD-10 Coding Code Description M86.671 Other chronic osteomyelitis, right ankle and foot L97.514 Non-pressure chronic ulcer of other part of right foot with necrosis of bone E10.621 Type 1 diabetes mellitus with foot ulcer Follow-up Appointments Return Appointment in 2 weeks. Dressing Change Frequency Wound #43 Right,Medial Foot Change dressing three times week. Wound Cleansing Clean wound with Wound Cleanser - Anasept to all wounds Primary Wound Dressing Wound #43 Right,Medial Foot Calcium Alginate with Silver Other: - may use thin layer of medihoney under alginate Secondary Dressing Wound #43 Right,Medial Foot Kerlix/Rolled [name] Dry [name] Edema Control Elevate legs to the level of the heart or above for 30 minutes daily and/or when sitting, a frequency of: - throughout the day Accident skilled nursing for wound care. - Interim Electronic Signature(s) Signed: 07/13/2019 6:12:10 PM By: [name] RN, BSN Signed: 07/14/2019 5:51:00 PM By: Linton Ham MD Entered By: [name] on 07/13/2019 13:11:52 -------------------------------------------------------------------------------- Problem List Details Patient Name: Date of Service: [name] 07/13/2019 12:45 PM Medical Record Number:[id number] Patient Account Number: [id number] Date of Birth/Sex: Treating RN: 1970/10/20 (49 y.o. Nancy Fetter Primary Care  Provider: [name] Other Clinician: Referring Provider: Treating Provider/Extender:Cono Gebhard, [name], [name] in Treatment: 76 Active Problems ICD-10 Evaluated Encounter Code Description Active Date Today Diagnosis M86.671 Other chronic osteomyelitis, right ankle and foot 09/03/2018 No Yes L97.514 Non-pressure chronic ulcer of other part of right foot 09/03/2018 No Yes with necrosis of bone E10.621 Type 1 diabetes mellitus with foot ulcer 09/24/2018 No Yes Inactive Problems ICD-10 Code Description Active Date Inactive Date L97.521 Non-pressure chronic ulcer of other part of left foot limited to 04/20/2019 04/20/2019 breakdown of skin L97.812 Non-pressure chronic ulcer of other part of right lower leg with 02/24/2019 02/24/2019 fat layer exposed Resolved Problems ICD-10 Code Description Active Date Resolved Date L02.415 Cutaneous abscess of right lower limb 12/25/2018 12/25/2018 Electronic Signature(s) Signed: 07/14/2019 5:51:00 PM By: Linton Ham MD Entered By: Linton Ham on 07/13/2019 13:24:15 -------------------------------------------------------------------------------- Progress Note Details Patient Name: Date of Service: [name] 07/13/2019 12:45 PM Medical Record Number:[id number] Patient Account Number: [id number] Date of Birth/Sex: Treating RN: Oct 08, 1970 (49 y.o. Nancy Fetter Primary Care Provider: [name] Other Clinician: Referring Provider: Treating Provider/Extender:Zen Cedillos, [name], [name] in Treatment: 49 Subjective History of Present Illness (HPI) 49 year old diabetic who is known to have type 1 diabetes which is poorly controlled last hemoglobin A1c was 11%. She comes in with a ulcerated area on the left lateral foot which has been there for over  6 months. Was recently she has been treated by [name] of podiatry who saw her last on 05/28/2016. Review of his notes revealed that the patient had incision and  drainage with placement of antibiotic beads to the left foot on 04/11/2016 for possible osteomyelitis of the cuboid bone. Over the last year she's had a history of amputation of the left fifth toe and a femoropopliteal popliteal bypass graft somewhere in April 2017. 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 His note [name] mentions that the patient has been referred to me for further wound care and possibly great candidate for hyperbaric oxygen therapy due to recurrent osteomyelitis. However we do not have any x-rays of biopsy reports confirming this. He has been on several antibiotics including Bactrim and most recently is on doxycycline for an MRSA. I understand, the patient was not a candidate for IV antibiotics as she has had previous PICC lines which resulted in blood clots in both arms. There was a x-ray report dated 04/04/2016 on [name] notes which showed evidence of fifth ray resection left foot with osteolytic changes noted to the fourth metatarsal and cuboid bone on the left. 06/13/2016 -- had a left foot x-ray which showed no acute fracture or dislocation and no definite radiographic evidence of osteomyelitis. Advanced osteopenia was seen. 06/20/2016 -- she has noticed a new wound on the right plantar foot in the region where she had a callus before. 06/27/16- the patient did have her x-ray of the right foot which showed no findings to suggest osteomyelitis. She saw her endocrinologist, [name], yesterday. Her A1c in January was 11. He also indicates mismanagement and noncompliance regarding her diabetes. She is currently on Bactrim for a lip infection. She is complaining of nausea, vomiting and diarrhea. She is unable to articulate the exact orders or dosing of the Bactrim; it is unclear when she will complete this. 07/04/2016 -- results from [facility] of ABIs with ankle waveforms were noted from 02/14/2016. The examination done on 06/27/2015 showed  noncompressible ABIs with the right being 1.45 and the left being 1.33. The present examination showed a right ABI of 1.19 on the left of 1.33. The conclusion was that right normal ABI in the lower extremity at rest however compared to previous study which was noncompressible ABI may be falsely elevated side suggesting medial calcification. The left ABI suggested medial calcification. 08/01/2016 -- the patient had more redness and pain on her right foot and did not get to come to see as noted she see her PCP or go to the ER and decided to take some leftover metronidazole which she had at home. As usual, the patient does report she feels and is rather noncompliant. 08/08/2016 -- -- x-ray of the right foot -- FINDINGS:Transmetatarsal amputation is noted. No bony destruction is noted to suggest osteomyelitis. IMPRESSION: No evidence of osteomyelitis. Postsurgical changes are seen. MRI would be more sensitive for possible bony changes. Culture has grown Serratia Marcescens -- sensitive to Bactrim, ciprofloxacin, ceftazidime she was seen by [name] on 08/06/2016. He did not find any exposed bone, muscle, tendon, ligament or joint. There was no malodor and he did a excisional debridement in the office. ============ Old notes: 49 year old patient who is known to the wound clinic for a while had been away from the wound clinic since 09/01/2014. Over the last several months she has been admitted to various hospitals including [place] [facility] She was treated for a right metatarsal osteomyelitis with  a transmetatarsal amputation and this was done about 2 months ago. He has a small ulcerated area on the right heel and she continues to have an ulcerated area on the left plantar aspect of the foot. The patient was recently admitted to the [facility] hospital group between 7/12 and 10/18/2014. she was given 3 weeks of IV vancomycin and was to follow-up with her surgeons at [facility] and  also took oral vancomycin for C. difficile colitis. 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 MRI of the left foot done on 10/14/2014 showed no abscess or osteomyelitis. 04/27/15; this is a patient we know from previous stays in the wound care center. She is a type I diabetic I am not sure of her control currently. Since the last time I saw her she is had a right transmetatarsal amputation and has no wounds on her right foot and has no open wounds. She is been followed at the wound care center at [facility] in [place]. She comes today with the desire to undergo hyperbaric treatment locally. Apparently one of her wound care providers in [place] has suggested hyperbarics. This is in response to an MRI from 04/18/15 that showed increased marrow signal and loss of the proximal fifth metatarsal cortex evidence of osteomyelitis with likely early osteomyelitis in the cuboid bone as well. She has a large wound over the base of the fifth metatarsal. She also has a eschar over her the tips of her toes on 1,3 and 5. She does not have peripheral pulses and apparently is going for an angiogram tomorrow which seems reasonable. After this she is going to infectious disease at [facility]. They have been using Medihoney to the large wound on the lateral aspect of the left foot to. The patient has known Charcot deformity from diabetic neuropathy. She also has known diabetic PAD. Surprisingly I can't see that she has had any recent antibiotics, the patient states the last antibiotic she had was at the end of November for 10 days. I think this was in response to culture that showed group G strep although I'm not exactly sure  where the culture was from. She is also had arterial studies on 03/29/15. This showed a right ABI of 1.4 that was noncompressible. Her left ABI was 0.73. 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 It was not felt that arterial inflow was adequate for healing of a foot ulcer. Her Doppler waveforms looked monophasic ===== READMISSION 02/28/17; this is in an now 49 year old woman we've had at several different occasions in this clinic. 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 She has a remote ex-smoker. She was last seen in this clinic by Dr. Con Memos I think in May. More recently she is been followed by her podiatrist [name] an infectious disease Dr. Megan Salon. She has 2 open wounds the major one is over the right first metatarsal head she also has a wound on the left plantar foot. an MRI of the right foot on 01/01/17 showed a soft tissue ulcer along the plantar aspect of the first metatarsal base consistent with osteomyelitis of the first metatarsal stump. Dr. 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 According to the  patient this is been open for slightly over a month. She has been on a combination of Cipro 500 twice a day, Zyvox 600 twice a day and Flagyl 500 3 times a day for over a month now as directed by Dr. Megan Salon. cultures of the right foot earlier this year showed MRSA in January and Serratia in May. January also had a few viridans strep. Recent x-rays of both feet were done and [name] office and I don't have these reports. The patient has known PAD and has a history of aleft femoropopliteal bypass in April 2017. 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 Dwyane Dee. In spite of this the last hemoglobin A1c I can see is 10.1 on  01/01/2017. She is being referred by 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 She is been using Medihoney to this area and also an area on the left midfoot. She is using healing sandals bilaterally. ABIs in this clinic at the left posterior tibial was 1.1 noncompressible on the right READMISSION Non invasive vascular NOVANT 5/18 Aftercare following surgery of the circulatory system Procedure Note - Interface, External Ris In - 08/13/2016 11:05 AM EDT Procedure: Examination consists of physiologic resting arterial pressures of the brachial and ankle arteries bilaterally with continuous wave Doppler waveform analysis. Previous: Previous exam performed on 02/14/16 demonstrated ABIs of Rt = 1.19 and Lt = 1.33. Right: ABI = non-compressible PT, 1.47 DP. S/P transmet amputation. Left: ABI = 1.52, 2nd digit pressure = 87 mmHg Conclusions: Right: ABI (>1.3) may be falsely elevated, suggesting medial calcification. Left: ABI (>1.3) may be falsely elevated, suggesting medial calcification The patient is a now 49 year old type I diabetic is had multiple issues her graded to chronic diabetic foot ulcers. 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 We had her in the clinic lastin November. At that point she had wounds on her bilateral feet.she had wanted to try hyperbarics however the healogics review process denied her because she hadn't followed up with her vascular surgeon for her left femoropopliteal bypass. The bypass was done by Dr. Raul Del at [facility]. We made her a follow-up with Dr. Raul Del however she did not keep the appointment and therefore she was not approved The patient shows me a small wound on her left fourth metatarsal head on her phone. 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 She was admitted acutely ill with a temperature of 103. She was started on broad-spectrum vancomycin and cefepime. On 05/06/17 she was taken to the OR by [name] her podiatric surgeon for an incision and drainage irrigation of the left foot wound. Cultures from this surgery revealed group be strep and anaerobes. she was seen by Dr.Xu of orthopedic surgery and scheduled for a below-knee amputation which she u refused. Ultimately she was discharged on Levaquin and Flagyl for one month. MRI 05/05/17 done while she was in the hospital showed abscess adjacent to the fourth metatarsal head and neck small abscess around the fourth flexor tendon. Inflammatory phlegmon and gas in the soft tissues along the lateral  aspect of the fourth phalanx. Findings worrisome for osteomyelitis involving the fourth proximal and middle phalanx and also the third and fourth metatarsals. Finally the patient had actually shortly before this followed up with Dr. Raul Del at no time on 04/29/17. He felt that her left femoropopliteal bypass was patent he felt that her left-sided toe pressures more than adequate for healing a wound on the left foot. This was before her acute presentation. 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 05/28/17; she is started hyperbarics. The patient tells me that for some reason she was not actually on Levaquin but I think on ciprofloxacin. She was on Flagyl. She only started her Levaquin yesterday due to some difficulty with the pharmacy and perhaps her sister picking it up. She has an appointment with [name] tomorrow and with infectious disease early next week. She has no new complaints 06/06/17; the patient continues in hyperbarics. She saw [name] on 05/29/17 who is her podiatric surgeon. He is elected for a transmetatarsal  amputation on 06/27/17. I'm not sure at what level he plans to do this amputation. The patient is unaware ooShe also saw Dr. Megan Salon of infectious disease who elected to continue her on current antibiotics I think this is ciprofloxacin and Flagyl. I'll need to clarify with her tomorrow if she actually has this. We're using silver alginate to the actual wound. Necrotic surface today with material under the flap of her foot. ooOriginal MRI showed abscesses as well as osteomyelitis of the proximal and middle fourth phalanx and the third and fourth metatarsal heads 06/11/17; patient continues in hyperbarics and continues on oral antibiotics. She is doing well. The wound looks better. The necrotic part of this under the flap in her superior foot also looks better. she is been to see [name]. I haven't had a chance to look at his note. Apparently he has put the transmetatarsal amputation on hold her request it is still planning to take her to the OR for debridement and product application ACEL. I'll see if I can find his note. I'll therefore leave product ordering/requests to [name] for now. I was going to look at Dermagraft 06/18/17-she is here in follow-up evaluation for bilateral foot wounds. She continues with hyperbaric therapy. She states she has been applying manuka honey to the right plantar foot and alternate manuka honey and silver alginate to the left foot, despite our orders. We will continue with same treatment plan and she will follo up next week. 06/25/17; I have reviewed [name] last note from 3/11. She has operative debridement in 2 days' time. By review his note apparently they're going to place there is skin over the majority of this wound which is a good choice. She has a small satellite area at the most proximal part of this wound on the left plantar foot. The area on the right plantar foot we've been using silver alginate and it is close to healing. 07/02/17; unfortunately the  patient was not easily approved for [name] proposed surgery. I'm not completely certain what the issue is. She has been using silver alginate to the wound she has completed a first course of hyperbarics. She is still on Levaquin and Flagyl. I have really lost track of the time course here.I suspect she should have another week to 2 of antibiotics. I'll need to see if she is followed up with infectious disease Dr. Megan Salon 07/09/17; the patient is followed up with Dr. Megan Salon. She has a severe deep diabetic infection of  her left foot with a deep surgical wound. She continues on Levaquin and metronidazole continuing both of these for now I think she is been on fr about 6 weeks. She still has some drainage but no pain. No fever. Her had been plans for her to go to the OR for operative debridement with her podiatrist [name], I am not exactly sure where that is. I'll probably slip a note to [name] today. I note that she follows with Dr. Dwyane Dee of endocrinology. We have her recertified for hyperbaric oxygen. I have not heard about Dermagraft however I'll see if [name] is planning a skin substitute as well 07/16/17; the patient tells me she is just about out of [place]. I'll need to check Dr. Hale Bogus last notes on this. She states she has plenty of Flagyl however. She comes in today complaining of pain in the right lateral foot which she said lasted for about a day. The wound on the right foot is actually much more medially. She also tells me that the [facility] cost a lot of pain in the left foot wound and she turned back to silver alginate. Finally Dermagraft has a $194 per application co-pay. She cannot afford this 07/23/17; patient arrives today with the wound not much smaller. There is not much new to add. She has not heard from [name] all try to put in a call to them today. She was asking about Dermagraft again and she has an over $174 per application co-pay she states that she  would be willing to try to do a payment plan. I been tried to avoid this. We've been using silver alginate, I'll change to [facility] 07/30/17-She is here in follow-up evaluation for left foot ulcer. She continues hyperbaric medicine. The left foot ulcer is stable we will continue with same treatment plan 08/06/17; she is here for evaluation of her left foot ulcer. Currently being treated for hyperbarics or underlying osteomyelitis. She is completed antibiotics. The left foot ulcer is better smaller with healthier looking granulation. For various reasons I am not really clear on we never got her back to the OR with [name]. He did not respond to my secure text message. Nevertheless I think that surgery on this point is not necessary nor am I completely clear that a skin substitute is necessary The patient is complaining about pain on the outside of her right foot. 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 There is no erythema. She also states the foot is warm versus her other part of her upper leg and this is largely true. It is not totally clear to me what's causing this. She thinks it's different from her usual neuropathy pain 08/13/17; she arrives in clinic today with a small wound which is superficial on her right first metatarsal head. 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 She tells [place] she was up on her feet over the Mother's Day celebration. ooThe large wound is on the left foot. Continues with hyperbarics for underlying osteomyelitis. We're using Hydrofera Blue. She asked me today about where we were with Dermagraft. I had actually excluded this because of the co- pay however she wants to assume this therefore I'll recheck the co-pay an order for next week. 08/20/17; the patient agreed to accept the co-pay of the first Apligraf which we applied today. She is disappointed she is finishing hyperbarics will run this through the insurance on the extent of  the foot infection and the  extent of the wound that she had however she is already had 60 dive's. Dermagraft No. 1 08/27/17; Dermagraft No. 2. She is not eligible for any more hyperbaric treatments this month. She reports a fair amount of drainage and she actually changed to the external dressings without disturbing the direct contact layer 09/03/17; the patient arrived in clinic today with the wound superficially looking quite healthy. Nice vibrant red tissue with some advancing epithelialization although not as much adherence of the flap as I might like. However she noted on her own fourth toe some bogginess and she brought that to our attention. Indeed this was boggy feeling like a possibility of subcutaneous fluid. 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 She is not been unwell. We've been using Dermagraft 09/10/17; the culture that I did not last week was MRSA. She saw Dr. Megan Salon this morning who is going to start her on vancomycin. I had sent him a secure a text message yesterday. I also spoke with her podiatric surgeon [name] about surgery on this foot the options for conserving a functional foot etc. Promised me he would see her and will make back consultation today. Paradoxically her actual wound on the plantar aspect of her left foot looks really quite good. I had given her 5 days worth of Baxdella to cover her for MRSA. Her MRI came back showing osteomyelitis within the third metatarsal shaft and head and base of the third and fourth proximal phalanx. She had extensive inflammatory changes throughout the soft tissue of the lateral forefoot. With an ill-defined fluid around the fourth metatarsal extending into the plantar and dorsal soft tissues 09/19/17; the patient is actually on oral Septra and Flagyl. She apparently refused IV vancomycin. She also saw [name] at my request who is planning her for a left BKA  sometime in mid July. MRI showed osteomyelitis within the third metatarsal shaft and head and the basis of the third and fourth proximal phalanx. I believe there was felt to be possible septic arthritis involving the third MTP. 09/26/17; the patient went back to Dr. Megan Salon at my suggestion and is now receiving IV daptomycin. Her wound continues to look quite good making the decision to proceed with a transmetatarsal amputation although more difficult for the patient. I believe in my extensive discussions with her she has a good sense of the pros and cons of this. I don't NV the tuft decision she has to make. She has an appointment with [name] I believe in mid July and I previously spoken to him about this issue Has we had used 3 previous Dermagraft. Given the condition of the wound surface I went ahead and added the fourth one today area and I did this not fully realizing that she'll be traveling to [place] next week. I'm hopeful she can come back in 2 weeks 10/21/17; Her same Dermagraft on for about 3-1/2 weeks. In spite of this the wound arrives looking quite healthy. There is been a lot of healing dimensions are smaller. Looking at the square shaped wound she has now there is some undermining and some depth medially under the undermining although I cannot palpate any bone. No surrounding infection is obvious. She has difficult questions about how to look at this going forward vis--vis amputations versus continued medical therapy. To be truthful the wound is looks so healthy and it is continued to contract. Hard to  justify foot surgery at this point although I still told her that I think it might come to that if we are not able to eradicate the underlying MRSA. She is still highly at risk and she understands this 11/06/17 on evaluation today patient appears to be doing better in regard to her foot ulcer. She's been tolerating the dressing changes without complication. Currently she is here  for her Dermagraft #6. Her wound continues to make excellent progress at this point. She does not appear to have any evidence of infection which is good news. 11/13/17 on evaluation today patient appears to be doing excellent at this time. She is here for repeat Dermagraft application. This is #7. Overall her wound seems to be making great progress. 12/05/17; the patient arrives with the wound in much better condition than when I last saw this almost 6 weeks ago. She still has a small probing area in the left metatarsal head region on the lateral aspect of her foot. We applied her last Dermagraft today. ooSince the last time she is here she has what appears to a been a blood blister on the plantar aspect of left foot although I don't see this is threatening. There is also a thick raised tissue on the right mid metatarsal head region. This was not there I don't think the last time she was here 3 weeks ago. 12/12/17; the patient continues to have a small programming area in the left metatarsal head region on the lateral aspect of her foot which was the initial large surgical wound. I applied her last Apligraf last week. I'm going to use Endoform starting today ooUnfortunately she has an excoriated area in the left mid foot and the right mid foot. The left midfoot looks like a blistered area this was not opened last week it certainly is open today. Using silver alginate on these areas. She promises me she is offloading this. 12/19/17; the small probing area in the left metatarsal head eyes think is shallower. In general her original wound looks better. We've been using Endoform. The area inferiorly that I think was trauma last week still requires debridement a lot of nonviable surface which I removed. She still has an open open area distally in her foot ooSimilarly on the right foot there is tightly adherent surface debris which I removed. Still areas that don't look completely epithelialized. This is  a small open area. We used silver alginate on these areas 12/26/2017; the patient did not have the supplies we ordered from last week including the Endoform. The original large wound on the left lateral foot looks healthy. She still has the undermining area that is largely unchanged from last week. She has the same heavily callused raised edged wounds on the right mid and left midfoot. Both of these requiring debridement. We have been using silver alginate on these areas 01/02/2018; there is still supply issues. We are going to try to use Prisma but I am not sure she actually got it from what she is saying. She has a new open area on the lateral aspect of the left fourth toe [previous fifth ray amputation]. Still the one tunneling area over the fourth metatarsal head. The area is in the midfoot bilaterally still have thick callus around them. She is concerned about a raised swelling on the lateral aspect of the foot. However she is completely insensate 01/10/2018; we are using Prisma to the wounds on her bilateral feet. Surprisingly the tunneling area over the left fourth metatarsal  head that was part of her original surgery has closed down. She has a small open area remaining on the incision line. 2 open areas in the midfoot. 02/10/2018; the patient arrives back in clinic after a month hiatus. She was traveling to visit family in [place]. Is fairly clear she was not offloading the areas on her feet. The original wound over the left lateral foot at the level of metatarsal heads is reopened and probes medially by about a centimeter or 2. She notes that a week ago she had purulent drainage come out of an area on the left midfoot. Paradoxically the worst area is actually on the right foot is extensive with purulent drainage. We will use silver alginate today 02/17/2018; the patient has 3 wounds one over the left lateral foot. She still has a small area over the metatarsal heads which is the remnant of  her original surgical wound. This has medial probing depth of roughly 1.4 cm somewhat better than last week. The area on the right foot is larger. We have been using silver alginate to all areas. The area on the right foot and left foot that we cultured last week showed both Klebsiella and Proteus. Both of these are quinolone sensitive. The patient put her's self on Bactrim and Flagyl that she had left hanging around from prior antibiotic usages. She was apparently on this last week when she arrived. I did not realize this. Unfortunately the Bactrim will not cover either 1 of these organisms. We will send in Cipro 500 twice daily for a week 03/04/2018; the patient has 2 wounds on the left foot one is the original wound which was a surgical wound for a deep DFU. At one point this had exposed bone. She still has an area over the fourth metatarsal head that probes about 1.4 cm although I think this is better than last week. I been using silver nitrate to try and promote tissue adherence and been using silver alginate here. ooShe also has an area in the left midfoot. This has some depth but a small linear wound. Still requiring debridement. ooOn the right midfoot is a circular wound. A lot of thick callus around this area. ooWe have been using silver alginate to all wound areas ooShe is completed the ciprofloxacin I gave her 2 weeks ago. 03/11/2018; the patient continues to have 2 open areas on the left foot 1 of which was the original surgical wound for a deep DFU. Only a small probing area remains although this is not much different from last week we have been using silver alginate. The other area is on the midfoot this is smaller linear but still with some depth. We have been using silver alginate here as well ooOn the right foot she has a small circular wound in the mid aspect. This is not much smaller than last time. We have been using silver alginate here as well 03/18/2018; she has 3  wounds on the left foot the original surgical wound, a very superficial wound in the mid aspect and then finally the area in the mid plantar foot. She arrives in today with a very concerning area in the wound in the mid plantar foot which is her most proximal wound. There is undermining here of roughly 1-1/2 cm superiorly. Serosanguineous drainage. She tells me she had some pain on for over the weekend that shot up her foot into her thigh and she tells me that she had a nodule in the groin area. ooShe  has the single wound in the right foot. ooWe are using endoform to both wound areas 03/24/2018; the patient arrives with the original surgical wound in the area on the left midfoot about the same as last week. There is a collection of fluid under the surface of the skin extending from the surgical wound towards the midfoot although it does not reach the midfoot wound. The area on the right foot is about the same. Cultures from last week of the left midfoot wound showed abundant Klebsiella abundant Enterococcus faecalis and moderate methicillin resistant staph I gave her Levaquin but this would have only covered the Klebsiella. She will need linezolid 04/01/2018; she is taking linezolid but for the first few days only took 1 a day. I have advised her to finish this at twice daily dosing. In any case all of her wounds are a lot better especially on the left foot. The original surgical wound is closed. The area on the left midfoot considerably smaller. The area on the right foot also smaller. 04/08/2018; her original surgical wound/osteomyelitis on the left foot remains closed. She has area on the left foot that is in the midfoot area but she had some streaking towards this. This is not connected with her original wound at least not visually. ooSmall wound on the right midfoot appears somewhat smaller. 04/15/18; both wounds looks better. Original wound is better left midfoot. Using silver  alginate 1/21; patient states she uses saltwater soak in, stones or remove callus from around her wounds. She is also concerned about a blood blister she had on the left foot but it simply resolved on its own. We've been using silver alginate 1/28; the patient arrives today with the same streaking area from her metatarsals laterally [the site of her original surgical wound] down to the middle of her foot. There is some drainage in the subcutaneous area here. This concerns me that there is actually continued ongoing infection in the metatarsals probably the fourth and third. This fixates an MRI of the foot without contrast [chronic renal failure] ooThe wound in the mid part of the foot is small but I wonder whether this area actually connects with the more distal foot. ooThe area on the right midfoot is probably about the same. Callus thick skin around the small wound which I removed with a curette we have been using silver alginate on both wound areas 2/4; culture I did of the draining site on the left foot last time grew methicillin sensitive staph aureus. MRI of the left foot showed interval resolution of the findings surrounding the third metatarsal joint on the prior study consistent with treated osteomyelitis. Chronic soft tissue ulceration in the plantar and lateral aspect of the forefoot without residual focal fluid collection. No evidence of recurrent osteomyelitis. 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 She tells me she had nausea and vomiting over the weekend and missed 2 days. In general her foot looks much the same. She has a small open area just below the left fourth metatarsal head. A linear area in the left midfoot. Some discoloration  extending from the inferior part of this into the left lateral foot although this appears to be superficial. She has a small area on the right midfoot which  generally looks smaller after debridement 2/18; the patient is completing his cephalexin and has another 2 days. She continues to have open areas on the left and right foot. 2/25; she is now off antibiotics. The area on the left foot at the site of her original surgical wound has closed yet again. She still has open areas in the mid part of her foot however these appear smaller. The area on the right mid foot looks about the same. We have been using silver alginate She tells me she had a serious hypoglycemic spell at home. She had to have EMS called and get IV dextrose 3/3; disappointing on the left lateral foot large area of necrotic tissue surrounding the linear area. This appears to track up towards the same original surgical wound. Required extensive debridement. The area on the right plantar foot is not a lot better also using silver 3/12; the culture I did last time showed abundant enterococcus. I have prescribed Augmentin, should cover any unrecognized anaerobes as well. In addition there were a few MRSA and Serratia that would not be well covered although I did not want to give her multiple antibiotics. She comes in today with a new wound in the right midfoot this is not connected with the original wound over her MTP a lot of thick callus tissue around both wounds but once again she said she is not walking on these areas 3/17-Patient comes in for follow-up on the bilateral plantar wounds, the right midfoot and the left plantar wound. Both these are heavily callused surrounding the wounds. We are continuing to use silver alginate, she is compliant with offloading and states she uses a wheelchair fairly often at home 3/24; both wound areas have thick callus. However things actually look quite a bit better here for the majority of  her left foot and the right foot. 3/31; patient continues to have thick callused somewhat irritated looking tissue around the wounds which individually are fairly superficial. There is no evidence of surrounding infection. We have been using silver alginate however I change that to [facility] today 4/17; patient returns to clinic after having a scare with Covid she tested negative in her primary doctor's office. She has been using Hydrofera Blue. She does not have an open area on the right foot. On the left foot she has a small open area with the mid area not completely viable. She showed me pictures of what looks like a hemorrhagic blister from several days ago but that seems to have healed over this was on the lateral left foot 4/21; patient comes in to clinic with both her wounds on her feet closed. However over the weekend she started having pain in her right foot and leg up into the thigh. She felt as though she was running a low-grade fever but did not take her temperature. She took a doxycycline that she had leftover and yesterday a single Septra and metronidazole. She thinks things feel somewhat better. 4/28; duplex ultrasound I ordered last week was negative for DVT or superficial thrombophlebitis. She is completed the doxycycline I gave her. States she is still having a lot of pain in the right calf and right ankle which is no better than last week. She cannot sleep. She also states she has a temperature of up to 101, coughing and complaining of visual loss in her bilateral eyes. Apparently she was tested for Covid 2 weeks ago at [facility] and that was negative. Readmission: 09/03/18 patient presents back for reevaluation  after having been evaluated at the end of April regarding erythema and swelling of her right lower extremity. Subsequently she ended up going to the hospital on 07/29/18 and was admitted not to be discharged until 08/08/18. Unfortunately it was noted during the time that  she was in the hospital that she did have methicillin-resistant Staphylococcus aureus as the infection noted at the site. It was also determined that she did have osteomyelitis which appears to be fairly significant. She was treated with vancomycin and in fact is still on IV vancomycin at dialysis currently. This is actually slated to continue until 09/12/18 at least which will be the completion of the six weeks of therapy. Nonetheless based on what I'm seeing at this point I'm not sure she will be anywhere near ready to discontinue antibiotics at that time. Since she was released from the hospital she was seen by [name] who is her podiatrist on 08/27/18. His note specifically states that he is recommended that the patient needs of one knee amputation on the right as she has a life-threatening situation that can lead quickly to sepsis. The patient advised she would like to try to save her leg to which [name] apparently told her that this was against all medical advice. She also want to discontinue the Wound VAC which had been initiated due to the fact that she wasn't pleased with how the wound was looking and subsequently she wanted to pursue applying Medihoney at that time. He stated that he did not believe that the right lower extremity was salvageable and that the patient understood but would still like to attempt hyperbaric option therapy if it could be of any benefit. She was therefore referred back to [place] for further evaluation. He plans to see her back next week. Upon inspection today patient has a significant amount purulent drainage noted from the wound at this point. The bone in the distal portion of her foot also appears to be extremely necrotic and spongy. When I push down on the bone it bubbles and seeps purulent drainage from deeper in the end of the foot. I do not think that this is likely going to heal very well at all and less aggressive surgical debridement were undertaken more  than what I believe we can likely do here in our office. 09/12/2018; I have not seen this patient since the most recent hospitalization although she was in our clinic last week. I have reviewed some of her records from a complex hospitalization. 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 There is situation was complicated by MRSA bacteremia and acute on chronic renal failure now on dialysis. She is receiving vancomycin at dialysis. We started her on Dakin's wet-to- dry last week she is changing this daily. There is still purulent drainage coming out of her foot. Although she is apparently "agreeable" to a below-knee amputation which is been suggested by multiple clinicians she wants this to be done in [place]. She apparently has a telehealth visit with that provider sometime in late [place] 6/24. I have told her I think this is probably too long. Nevertheless I could not convince her to allow a local doctor to perform BKA. 09/19/2018; the patient has a large necrotic area on the right anterior foot. She has had previous transmetatarsal amputations. Culture I did last week showed MRSA nothing else she is on vancomycin at dialysis. She has continued leaking purulent drainage out of the distal part of  the large circular wound on the right anterior foot. She apparently went to see Dr. 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 Somehow that translated into her being referred to plastic surgery for debridement of the area. I gather she basically refused amputation although I do not have a copy of Dr. Berenice Primas notes. The patient really wants to have a trial of hyperbaric oxygen. I agreed with initial assessment in this clinic that this was probably too far along to benefit however if she is going to have plastic surgery I think she would benefit from ancillary hyperbaric oxygen. The issue here is that the patient has benefited as  maximally as any patient I have ever seen from hyperbaric oxygen therapy. Most recently she had exposed bone on the lateral part of her left foot after a surgical procedure and that actually has closed. She has eschared areas in both heels but no open area. She is remained systemically well. I am not optimistic that anything can be done about this but the patient is very clear that she wants an attempt. The attempt would include a wound VAC further debridements and hyperbaric oxygen along with IV antibiotics. 6/26; I put her in for a trial of hyperbaric oxygen only because of the dramatic response she has had with wounds on her left midfoot earlier this year which was a surgical wound that went straight to her bone over the metatarsal heads and also remotely the left third toe. We will see if we can get this through our review process and insurance. She arrives in clinic with again purulent material pouring out of necrotic bone on the top of the foot distally. There is also some concerning erythema on the front of the leg that we marked. It is bit difficult to tell how tender this is because of neuropathy. I note from infectious disease that she had her vancomycin extended. 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 She had the wound VAC on for part of the week. The rest of the time she is putting various things on this including Medihoney, "ionized water" silver sorb gel etc. 7/7; follow-up along with HBO. She is still on vancomycin at dialysis. She has a large open area on the dorsal right foot and a small dark eschar area on her heel. There is a lot less erythema in the area and a lot less tenderness. From an infection point of view I think this is better. She still has a lot of necrosis in the remaining right forefoot [previous TMA] we are still using the wound VAC in this area 7/16; follow-up along with HBO. I put her on linezolid after she finished her  vancomycin. We started this last Friday I gave her 2 weeks worth. I had the expectation that she would be operatively debrided by Dr. Marla Roe but that still has not happened yet. Patient phoned the office this week. She arrives for review today after HBO. The distal part of this wound is completely necrotic. Nonviable pieces of tendon bone was still purulent drainage. Also concerning that she has black eschar over the heel that is expanding. I think this may be indicative of infection in this area as well. She has less erythema and warmth in the ankle and calf but still an abnormal exam 7/21 follow-up along with HBO. I will renew her linezolid after checking a CBC with differential monitoring her blood counts especially her platelets. She was supposed to have surgery  yesterday but if I am reading things correctly this was canceled after her blood sugar was found to be over 500. I thought Dr. Marla Roe who called me said that they were sending her to the ER but the patient states that was not the case. 7/28. Follow-up along with HBO. She is on linezolid I still do not have any lab work from dialysis even though I called last week. The patient is concerned about an area on her left lateral foot about the level of the base of her fifth metatarsal. I did not really see anything that ominous here however this patient is in [place] ability to point out problems that she is sensing and she has been accurate in the past Finally she received a call from Dr. Marla Roe who is referring her to another orthopedic surgeon stating that she is too booked up to take her to the operating room now. Was still using a wound VAC on the foot 8/3 -Follow-up after HBO, she is got another week of linezolid, she is to call [facility] for an appointment, x-rays of both feet were reviewed, the left foot x-ray with third MTP joint osteo- Right foot x-ray widespread osteo-in the right midfoot Right ankle x-ray does not show any  active evidence of infection 8/11-Patient is seen after HBO, the wounds on the right foot appear to be about the same, the heel wound had some necrotic base over tendon that was debrided with a curette 8/21; patient is seen after HBO. The patient's wound on her dorsal foot actually looks reasonably good and there is substantial amount of epithelialization however the open area distally still has a lot of necrotic debris partially bone. I cannot really get a good sense of just how deep this probes under the foot. She has been pressuring me this week to order medical maggots through a company in [place] for her. The problem I have is there is not a defined wound area here. On the positive side there is no purulence. She has been to see infectious disease she is still on Septra DS although I have not had a chance to review their notes 8/28; patient is seen in conjunction with HBO. The wounds on her foot continued to improve including the right dorsal foot substantially the, the distal part of this wound and the area on the right heel. We have been using a wound VAC over this chronically. She is still on trimethoprim as directed by infectious disease 9/4; patient is seen in conjunction with HBO. Right dorsal foot wound substantially anteriorly is better however she continues to have a deep wound in the distal part of this that is not responding. We have been using silver collagen under border foam ooArea on the right plantar medial heel seems better. We have been using Hydrofera Blue 12/12/18 on evaluation today patient appears to be doing about the same with regard to her wound based on prior measurements. She does have some necrotic tissue noted on the lateral aspect of the wound that is going require a little bit of sharp debridement today. This includes what appears to be potentially either severely necrotic bone or tendon. Nonetheless other than that she does not appear to have any severe  infection which is good news 9/18; it is been 2 weeks since I saw this wound. She is tolerating HBO well. Continued dramatic improvement in the area on the right dorsal foot. She still has a small wound on the heel that we have been using  Hydrofera Blue. She continues with a wound VAC 9/24; patient has to be seen emergently today with a swelling on her right lateral lower leg. She says that she told Dr. Evette Doffing about this and also myself on a couple of occasions but I really have no recollection of this. She is not systemically unwell and her wound really looked good the last time I saw this. She showed this to providers at dialysis and she was able to verify that she was started on cephalexin today for 5 doses at dialysis. She dialyzes on Tuesday Thursday and Saturday. 10/2; patient is seen in conjunction with HBO. The area that is draining on the right anterior medial tibia is more extensive. Copious amounts of serosanguineous drainage with some purulence. We are still using the wound VAC on the original wound then it is stable. Culture I did of the original IandD showed MRSA I contacted dialysis she is now on vancomycin with dialysis treatments. I asked them to run a month 10/9; patient seen in conjunction with HBO. She had a new spontaneous open area just above the wound on the right medial tibia ankle. More swelling on the right medial tibia. Her wound on the foot looks about the same perhaps slightly better. There is no warmth spreading up her leg but no obvious erythema. her MRI of the foot and ankle and distal tib-fib is not booked for next Friday I discussed this with her in great detail over multiple days. it is likely she has spreading infection upper leg at least involving the distal 25% above the ankle. She knows that if I refer her to orthopedics for infectious disease they are going to recommend amputation and indeed I am not against this myself. We had a good trial at trying to  heal the foot which is what she wanted along with antibiotics debridement and HBO however she clearly has spreading infection [probably staph aureus/MRSA]. Nevertheless she once again tells me she wants to wait the left of the MRI. She still makes comments about having her amputation done in [place]. 10/19; arrives today with significant swelling on the lateral right leg. Last culture I did showed Klebsiella. Multidrug- resistant. Cipro was intermediate sensitivity and that is what I have her on pending her MRI which apparently is going to be done on Thursday this week although this seems to be moving back and forth. She is not systemically unwell. We are using silver alginate on her major wound area on the right medial foot and the draining areas on the right lateral lower leg 10/26; MRI showed extensive abscess in the anterior compartment of the right leg also widespread osteomyelitis involving osseous structures of the midfoot and portions of the hindfoot. Also suspicion for osteomyelitis anterior aspect of the distal medial malleolus. Culture I did of the purulence once again showed a multidrug-resistant Klebsiella. I have been in contact with nephrology late last week and she has been started on cefepime at dialysis to replace the vancomycin We sent a copy of her MRI report to Dr. Geroge Baseman in [place] who is an orthopedic surgeon. The patient takes great stock in his opinion on this. She says she will go to [place] to have her leg amputated if Dr. Geroge Baseman does not feel there is any salvage options. 11/2; she still is not talk to her orthopedic surgeon in [place]. Apparently he will call her at [phone] this afternoon. The quality of this is she has not allowed me to refer her anywhere. She has  been told over and over that she needs this amputated but has not agreed to be referred. She tells me her blood sugar was 600 last night but she has not been febrile. 11/9; she never did got a  call from the orthopedic surgeon in [place] therefore that is off the radar. We have arranged to get her see orthopedic surgery at [facility]. She still has a lot of draining purulence coming out of the new abscess in her right leg although that probably came from the osteomyelitis in her right foot and heel. Meanwhile the original wound on the right foot looks very healthy. Continued improvement. The issue is that the last MRI showed osteomyelitis in her right foot extensively she now has an abscess in the right anterior lower leg. There is nobody in [place] who will offer this woman anything but an amputation and to be honest that is probably what she needs. I think she still wants to talk about limb salvage although at this point I just do not see that. She has completed her vancomycin at dialysis which was for the original staph aureus she is still on cefepime for the more recent Klebsiella. She has had a long course of both of these antibiotics which should have benefited the osteomyelitis on the right foot as well as the abscess. 11/16; apparently [place] elective surgery is shut down because of COVID-19 pandemic. I have reached out to some contacts at [facility] to see if we can get her an orthopedic appointment there. I am concerned about continually leaving this but for the moment everything is static. In fact her original large wound on this foot is closing down. It is the abscess on the right anterior leg that continues to drain purulent serosanguineous material. She is not currently on any antibiotics however she had a prolonged course of vancomycin [1 month] as well as cefepime for a month 02/24/2019 on evaluation today patient appears to be doing better than the last time I saw her. This is not a patient that I typically see. With that being said I am covering for Dr. Dellia Nims this week and again compared to when I last saw her overall the wounds in particular seem to be doing  significantly better which is good news. With that being said the patient tells me several disconcerting things. She has not been able to get in to see anyone for potential debridement in regard to her leg wounds although she tells me that she does not think it is necessary any longer because she is taking care of that herself. She noticed a string coming out of the lower wound on her leg over the last week. The patient states that she subsequently decided that we must of pack something in there and started pulling the string out and as it kept coming and coming she realized this was likely her tendon. With that being said she continued to remove as much of this as she could. She then I subsequently proceeded to using tubes of antibiotic ointment which she will stick down into the wound and then scored as much as she can until she sees it coming out of the other wound opening. She states that in doing this she is actually made things better and there is less redness and irritation. With regard to her foot wound she does have some necrotic tendon and tissue noted in one small corner but again the actual wound itself seems to be doing better with good granulation in general  compared to my last evaluation. 12/7; continued improvement in the wound on the substantial part of the right medial foot. Still a necrotic area inferiorly that required debridement but the rest of this looks very healthy and is contracting. She has 2 wounds on the right lateral leg which were her original drainage sites from her abscess but all of this looks a lot better as well. She has been using silver alginate after putting antibiotic biotic ointment in one wound and watching it come out the other. I have talked to her in some detail today. I had given her names of orthopedic surgeons at [facility] for second opinion on what to do about the right leg. I do not think the patient never called them. She has not been able to get a  hold of the orthopedic surgeon in [place] that she had put a lot of faith in as being somebody would give her an opinion that she would trust. I talked to her today and said even if I could get her in to another orthopedic surgeon about the leg which she accept an amputation and she said she would not therefore I am not going to press this issue for the moment 12/14; continued improvement in his substantial wound on the right medial foot. There is still a necrotic area inferiorly with tightly adherent necrotic debris which I have been working on debriding each time she is here. She does not have an orthopedic appointment. Since last time she was here I looked over her cultures which were essentially MRSA on the foot wound and gram- negative rods in the abscess on the anterior leg. 12/21; continued improvement in the area on the right medial foot. She is not up on this much and that is probably a good thing since I do not know it could support continuous ambulation. She has a small area on the right lateral leg which were remanence of the IandD's I did because of the abscess. I think she should probably have prophylactic antibiotics I am going to have to look this over to see if we can make an intelligent decision here. In the meantime her major wound is come down nicely. Necrotic area inferiorly is still there but looks a lot better 04/06/2019; she has had some improvement in the overall surface area on the right medial foot somewhat narrowedr both but somewhat longer. The areas on the right lateral leg which were initial IandD sites are superficial. Nothing is present on the right heel. We are using silver alginate to the wound areas 1/18; right medial foot somewhat smaller. Still a deep probing area in the most distal recess of the wound. She has nothing open on the right leg. She has a new wound on the plantar aspect of her left fourth toe which may have come from just pulling skin. The  patient using Medihoney on the wound on her foot under silver alginate. I cannot discourage her from this 2/1; 2-week follow-up using silver alginate on the right foot and her left fourth toe. The area on the right dorsal foot is contracted although there is still the deep area in the most distal part of the wound but still has some probing depth. No overt infection 2/15; 2-week follow-up. She continues to have improvement in the surface area on the dorsal right foot. Even the tunneling area from last time is almost closed. The area that was on the plantar part of her left fourth toe over the PIP is indeed  closed 3/1; 2-week follow-up. Continued improvement in surface area. The original divot that we have been debriding inferiorly I think has full epithelialization although the epithelialization is gone down into the wound with probably 4 mm of depth. Even under intense illumination I am unable to see anything open here. The remanence of the wound in this area actually look quite healthy. We have been using silver alginate 3/15; 2-week follow-up. Unfortunately not as good today. She has a comma shaped wound on the dorsal foot however the upper part of this is larger. Under illumination debris on the surface She also tells [place] that she was on her right leg 2 times in the last couple of weeks mostly to reach up for things above her head etc. She felt a sharp pain in the right leg which she thinks is somewhere from the ankle to the knee. The patient has neuropathy and is really uncertain. She cannot feel her foot so she does not think it was coming from there 3/29; 2-week follow-up. Her wound measures smaller. Surface of the wound appears reasonable. She is using silver alginate with underlying Medihoney. She has home health. X-rays I did of her tib-fib last time were negative although it did show arterial calcification 4/12; 2-week follow-up. Her wound measures smaller in length. Using manuka honey  with silver alginate on top. She has home health. Objective Constitutional Patient is hypotensive.. Pulse regular and within target range for patient.Marland Kitchen Respirations regular, non-labored and within target range.. Temperature is normal and within the target range for the patient.Marland Kitchen Appears in no distress. Vitals Time Taken: 12:55 PM, Height: 67 in, Weight: 125 lbs, BMI: 19.6, Temperature: 98.2 F, Pulse: 93 bpm, Respiratory Rate: 18 breaths/min, Blood Pressure: 146/82 mmHg. General Notes: Wound exam; the wound measures slightly smaller in length. Under illumination of fibrinous surface. Use an open curette for debridement post treatment this looked improved. Pedal pulses are palpable in the right foot Integumentary (Hair, Skin) Wound #43 status is Open. Original cause of wound was Gradually Appeared. The wound is located on the Right,Medial Foot. The wound measures 3.5cm length x 1.6cm width x 0.2cm depth; 4.398cm^2 area and 0.88cm^3 volume. There is Fat Layer (Subcutaneous Tissue) Exposed exposed. There is no tunneling or undermining noted. There is a medium amount of serosanguineous drainage noted. The wound margin is flat and intact. There is large (67-100%) red, pink granulation within the wound bed. There is a small (1-33%) amount of necrotic tissue within the wound bed including Adherent Slough. Assessment Active Problems ICD-10 Other chronic osteomyelitis, right ankle and foot Non-pressure chronic ulcer of other part of right foot with necrosis of bone Type 1 diabetes mellitus with foot ulcer Procedures Wound #43 Pre-procedure diagnosis of Wound #43 is a Diabetic Wound/Ulcer of the Lower Extremity located on the Right,Medial Foot .Severity of Tissue Pre Debridement is: Fat layer exposed. There was a Excisional Skin/Subcutaneous Tissue Debridement with a total area of 5.6 sq cm performed by Ricard Dillon., MD. With the following instrument(s): Curette to remove Viable and  Non-Viable tissue/material. Material removed includes Subcutaneous Tissue and Slough and. No specimens were taken. A time out was conducted at 13:08, prior to the start of the procedure. A Moderate amount of bleeding was controlled with Pressure. The procedure was tolerated well with a pain level of 0 throughout and a pain level of 0 following the procedure. Post Debridement Measurements: 3.5cm length x 1.6cm width x 0.2cm depth; 0.88cm^3 volume. Character of Wound/Ulcer Post Debridement is improved. Severity  of Tissue Post Debridement is: Fat layer exposed. Post procedure Diagnosis Wound #43: Same as Pre-Procedure Plan Follow-up Appointments: Return Appointment in 2 weeks. Dressing Change Frequency: Wound #43 Right,Medial Foot: Change dressing three times week. Wound Cleansing: Clean wound with Wound Cleanser - Anasept to all wounds Primary Wound Dressing: Wound #43 Right,Medial Foot: Calcium Alginate with Silver Other: - may use thin layer of medihoney under alginate Secondary Dressing: Wound #43 Right,Medial Foot: Kerlix/Rolled [name] Dry [name] Edema Control: Elevate legs to the level of the heart or above for 30 minutes daily and/or when sitting, a frequency of: - throughout the day Home Health: [facility] for wound care. - Interim 1. I have not changed her dressing which is manuka honey covered with silver alginate. 2. Debridement mechanically which is the first time in a while I have done this. Might consider an alternative if the wound stalls. She does not appear to have an arterial issue Electronic Signature(s) Signed: 07/14/2019 5:51:00 PM By: Linton Ham MD Entered By: Linton Ham on 07/13/2019 13:27:47 -------------------------------------------------------------------------------- SuperBill Details Patient Name: Date of Service: [name] 07/13/2019 Medical Record Number:[id number] Patient Account Number: [id number] Date of  Birth/Sex: Treating RN: 1970/05/21 (49 y.o. Nancy Fetter Primary Care Provider: [name] Other Clinician: Referring Provider: Treating Provider/Extender:Lashika Erker, [name], [name] in Treatment: 35 Diagnosis Coding ICD-10 Codes Code Description [phone] Other chronic osteomyelitis, right ankle and foot L97.514 Non-pressure chronic ulcer of other part of right foot with necrosis of bone E10.621 Type 1 diabetes mellitus with foot ulcer Facility Procedures CPT4 Code Description: 76160737 11042 - DEB SUBQ TISSUE 20 SQ CM/< ICD-10 Diagnosis Description L97.514 Non-pressure chronic ulcer of other part of right foot wit Modifier: h necrosis of Quantity: 1 bone Physician Procedures CPT4 Code Description: 1062694 85462 - WC PHYS SUBQ TISS 20 SQ CM ICD-10 Diagnosis Description L97.514 Non-pressure chronic ulcer of other part of right foot wit Modifier: h necrosis of Quantity: 1 bone Electronic Signature(s) Signed: 07/14/2019 5:51:00 PM By: Linton Ham MD Entered By: Linton Ham on 07/13/2019 13:28:36

## 2019-07-14 NOTE — Progress Notes (Signed)
[name], [name] ([id number]) Visit Report for 07/13/2019 Arrival Information Details Patient Name: Date of Service: [name], [name] 07/13/2019 12:45 PM Medical Record Number:[id number] Patient Account Number: [id number] Date of Birth/Sex: Treating RN: [birth year] (49 y.o. F) Dwiggins, Shannon Primary Care Ray Gervasi: [name] Other Clinician: Referring Gianmarco Roye: Treating Adeleine Pask/Extender:[name], [name], [name] in Treatment: 61 Visit Information History Since Last Visit Added or deleted any medications: No Patient Arrived: Wheel Chair Any new allergies or adverse reactions: No Arrival Time: 12:53 Had a fall or experienced change in No activities of daily living that may affect Accompanied By: self risk of falls: Transfer Assistance: None Signs or symptoms of abuse/neglect since last No Patient Identification Verified: Yes visito Secondary Verification Process Completed: Yes Hospitalized since last visit: No Patient Requires Transmission-Based No Implantable device outside of the clinic excluding No Precautions: cellular tissue based products placed in the center Patient Has Alerts: No since last visit: Has Dressing in Place as Prescribed: Yes Pain Present Now: No Electronic Signature(s) Signed: 07/13/2019 5:40:42 PM By: [name] Entered By: [name] on 07/13/2019 12:54:21 -------------------------------------------------------------------------------- Encounter Discharge Information Details Patient Name: Date of Service: [name] 07/13/2019 12:45 PM Medical Record Number:[id number] Patient Account Number: [id number] Date of Birth/Sex: Treating RN: 03-Jan-1971 (49 y.o. [name] Primary Care Natanel Snavely: [name] Other Clinician: Referring Treson Laura: Treating Anessa Charley/Extender:[name], [name], [name] in Treatment: 23 Encounter Discharge Information Items Post Procedure Vitals Discharge  Condition: Stable Temperature (F): 98.2 Ambulatory Status: Wheelchair Pulse (bpm): 93 Discharge Destination: Home Respiratory Rate (breaths/min): 18 Transportation: Other Blood Pressure (mmHg): 146/82 Accompanied By: self Schedule Follow-up Appointment: Yes Clinical Summary of Care: Patient Declined Electronic Signature(s) Signed: 07/13/2019 5:40:42 PM By: [name] Entered By: [name] on 07/13/2019 13:31:46 -------------------------------------------------------------------------------- Lower Extremity Assessment Details Patient Name: Date of Service: [name], [name] 07/13/2019 12:45 PM Medical Record Number:[id number] Patient Account Number: [id number] Date of Birth/Sex: Treating RN: [birth year] (49 y.o. [name] Primary Care Maijor Hornig: [name] Other Clinician: Referring Riad Wagley: Treating Khyran Riera/Extender:[name], [name], [name] in Treatment: 44 Edema Assessment Assessed: [Left: No] [Right: No] Edema: [Left: N] [Right: o] Calf Left: Right: Point of Measurement: cm From Medial Instep cm 28 cm Ankle Left: Right: Point of Measurement: cm From Medial Instep cm 18.5 cm Vascular Assessment Pulses: Dorsalis Pedis Palpable: [Right:Yes] Electronic Signature(s) Signed: 07/13/2019 5:40:42 PM By: [name] Entered By: [name] on 07/13/2019 12:57:07 -------------------------------------------------------------------------------- Multi Wound Chart Details Patient Name: Date of Service: [name] 07/13/2019 12:45 PM Medical Record Number:[id number] Patient Account Number: [id number] Date of Birth/Sex: Treating RN: 09/18/1970 (49 y.o. [name] Primary Care Mallory Enriques: [name] Other Clinician: Referring Genessis Flanary: Treating Lamis Behrmann/Extender:[name], [name], [name] in Treatment: 57 Vital Signs Height(in): 49 Pulse(bpm): 63 Weight(lbs): 125 Blood Pressure(mmHg): 146/82 Body  Mass Index(BMI): 20 Temperature(F): 98.2 Respiratory 18 Rate(breaths/min): Photos: [43:No Photos] [N/A:N/A] Wound Location: [43:Right, Medial Foot] [N/A:N/A] Wounding Event: [43:Gradually Appeared] [N/A:N/A] Primary Etiology: [43:Diabetic Wound/Ulcer of the N/A Lower Extremity] Comorbid History: [43:Cataracts, Chronic sinus N/A problems/congestion, Anemia, Sleep Apnea, Deep Vein Thrombosis, Hypertension, Peripheral Arterial Disease, Type I Diabetes, Osteoarthritis, Osteomyelitis, Neuropathy, Seizure Disorder] Date Acquired: [43:08/04/2018] [N/A:N/A] Weeks of Treatment: [43:44] [N/A:N/A] Wound Status: [43:Open] [N/A:N/A] Clustered Wound: [43:Yes] [N/A:N/A] Clustered Quantity: [43:1] [N/A:N/A] Measurements L x W x D 3.5x1.6x0.2 [N/A:N/A] (cm) Area (cm) : [09:3.267] [N/A:N/A] Volume (cm) : [43:0.88] [N/A:N/A] % Reduction in Area: [43:93.90%] [N/A:N/A] % Reduction in Volume: 96.00% [N/A:N/A] Classification: [43:Grade 4] [N/A:N/A] Exudate Amount: [43:Medium] [N/A:N/A] Exudate Type: [43:Serosanguineous] [N/A:N/A] Exudate  Color: [43:red, brown] [N/A:N/A] Wound Margin: [43:Flat and Intact] [N/A:N/A] Granulation Amount: [43:Large (67-100%)] [N/A:N/A] Granulation Quality: [43:Red, Pink] [N/A:N/A] Necrotic Amount: [43:Small (1-33%)] [N/A:N/A] Exposed Structures: [43:Fat Layer (Subcutaneous N/A Tissue) Exposed: Yes Fascia: No Tendon: No Muscle: No Joint: No Bone: No] Epithelialization: [43:Medium (34-66%)] [N/A:N/A] Debridement: [43:Debridement - Excisional N/A] Pre-procedure [43:13:08] [N/A:N/A] Verification/Time Out Taken: Tissue Debrided: [43:Subcutaneous, Slough] [N/A:N/A] Level: [43:Skin/Subcutaneous Tissue] [N/A:N/A] Debridement Area (sq cm):5.6 [N/A:N/A] Instrument: [43:Curette] [N/A:N/A] Bleeding: [43:Moderate] [N/A:N/A] Hemostasis Achieved: [43:Pressure] [N/A:N/A] Procedural Pain: [43:0] [N/A:N/A] Post Procedural Pain: [43:0] [N/A:N/A] Debridement Treatment Procedure was  tolerated [N/A:N/A] Response: [43:well] Post Debridement [43:3.5x1.6x0.2] [N/A:N/A] Measurements L x W x D (cm) Post Debridement [43:0.88] [N/A:N/A] Volume: (cm) Procedures Performed: Debridement [N/A:N/A] Treatment Notes Electronic Signature(s) Signed: 07/13/2019 6:12:10 PM By: [name] RN, BSN Signed: 07/14/2019 5:51:00 PM By: Linton Ham MD Entered By: Linton Ham on 07/13/2019 13:24:24 -------------------------------------------------------------------------------- Multi-Disciplinary Care Plan Details Patient Name: Date of Service: [name] 07/13/2019 12:45 PM Medical Record Number:[id number] Patient Account Number: [id number] Date of Birth/Sex: Treating RN: 09/17/1970 (49 y.o. [name] Primary Care Annarae Macnair: [name] Other Clinician: Referring Kylon Philbrook: Treating Edwin Cherian/Extender:[name], [name], [name] in Treatment: 79 Active Inactive Wound/Skin Impairment Nursing Diagnoses: Impaired tissue integrity Knowledge deficit related to ulceration/compromised skin integrity Goals: Patient/caregiver will verbalize understanding of skin care regimen Date Initiated: 09/03/2018 Target Resolution Date: 07/27/2019 Goal Status: Active Ulcer/skin breakdown will have a volume reduction of 30% by week 4 Date Initiated: 09/03/2018 Date Inactivated: 10/07/2018 Target Resolution Date: 10/01/2018 Unmet Goal Status: Unmet Reason: COMORBITIES Ulcer/skin breakdown will have a volume reduction of 50% by week 8 Date Initiated: 10/07/2018 Date Inactivated: 11/03/2018 Target Resolution Date: 10/31/2018 Unmet Unmet Goal Status: Unmet Reason: Osteomyelitis Interventions: Assess patient/caregiver ability to obtain necessary supplies Assess patient/caregiver ability to perform ulcer/skin care regimen upon admission and as needed Assess ulceration(s) every visit Provide education on ulcer and skin care Treatment Activities: Skin care regimen initiated :  09/03/2018 Topical wound management initiated : 09/03/2018 Notes: Electronic Signature(s) Signed: 07/13/2019 6:12:10 PM By: [name] RN, BSN Entered By: [name] on 07/13/2019 13:10:23 -------------------------------------------------------------------------------- Pain Assessment Details Patient Name: Date of Service: [name] 07/13/2019 12:45 PM Medical Record Number:[id number] Patient Account Number: [id number] Date of Birth/Sex: Treating RN: 01-Nov-1970 (49 y.o. [name] Primary Care Gerrit Rafalski: [name] Other Clinician: Referring Chania Kochanski: Treating Carmencita Cusic/Extender:[name], [name], [name] in Treatment: 65 Active Problems Location of Pain Severity and Description of Pain Patient Has Paino No Site Locations Pain Management and Medication Current Pain Management: Electronic Signature(s) Signed: 07/13/2019 5:40:42 PM By: [name] Entered By: [name] on 07/13/2019 12:57:01 -------------------------------------------------------------------------------- Patient/Caregiver Education Details Patient Name: Date of Service: [name] 4/12/2021andnbsp12:45 PM Medical Record Patient Account Number: [id number] Number: Treating RN: [name] Date of Birth/Gender: 17-May-1970 (49 y.o. F) Other Clinician: Primary Care Physician: Katharine Look Referring Physician: Physician/Extender: Juanito Doom in Treatment: 11 Education Assessment Education Provided To: Patient Education Topics Provided Wound/Skin Impairment: Methods: Explain/Verbal Responses: State content correctly Electronic Signature(s) Signed: 07/13/2019 6:12:10 PM By: [name] RN, BSN Entered By: [name] on 07/13/2019 13:10:38 -------------------------------------------------------------------------------- Wound Assessment Details Patient Name: Date of Service: [name]  07/13/2019 12:45 PM Medical Record Number:[id number] Patient Account Number: [id number] Date of Birth/Sex: Treating RN: September 14, 1970 (49 y.o. [name] Primary Care Marquell Saenz: [name] Other Clinician: Referring Everardo Voris: Treating Lacora Folmer/Extender:[name], [name], [name] in Treatment: 40 Wound Status Wound Number: 43 Primary Diabetic Wound/Ulcer of the Lower  Extremity Etiology: Wound Location: Right, Medial Foot Wound Open Wounding Event: Gradually Appeared Status: Date Acquired: 08/04/2018 Comorbid Cataracts, Chronic sinus problems/congestion, Weeks Of Treatment: 44 History: Anemia, Sleep Apnea, Deep Vein Thrombosis, Clustered Wound: Yes Hypertension, Peripheral Arterial Disease, Type I Diabetes, Osteoarthritis, Osteomyelitis, Neuropathy, Seizure Disorder Photos Photo Uploaded By: [name] on 07/14/2019 14:20:02 Wound Measurements Length: (cm) 3.5 % Reducti Width: (cm) 1.6 % Reducti Depth: (cm) 0.2 Epithelia Clustered Quantity: 1 Tunneling Area: (cm) 4.398 Undermin Volume: (cm) 0.88 Wound Description Classification: Grade 4 Wound Margin: Flat and Intact Exudate Amount: Medium Exudate Type: Serosanguineous Exudate Color: red, brown Wound Bed Granulation Amount: Large (67-100%) Granulation Quality: Red, Pink Necrotic Amount: Small (1-33%) Necrotic Quality: Adherent Slough Foul Odor After Cleansing: No Slough/Fibrino Yes Exposed Structure Fascia Exposed: No Fat Layer (Subcutaneous Tissue) Exposed: Yes Tendon Exposed: No Muscle Exposed: No Joint Exposed: No Bone Exposed: No on in Area: 93.9% on in Volume: 96% lization: Medium (34-66%) : No ing: No Treatment Notes Wound #43 (Right, Medial Foot) 1. Cleanse With Wound Cleanser 2. Periwound Care Skin Prep 3. Primary Dressing Applied Calcium Alginate Ag Other primary dressing (specifiy in notes) 4. Secondary Dressing Dry [name] Notes thin layer of medihoney, ace wrap patient  request. Electronic Signature(s) Signed: 07/13/2019 5:40:42 PM By: [name] Entered By: [name] on 07/13/2019 12:59:08 -------------------------------------------------------------------------------- Vitals Details Patient Name: Date of Service: [name] 07/13/2019 12:45 PM Medical Record Number:[id number] Patient Account Number: [id number] Date of Birth/Sex: Treating RN: [birth year] (49 y.o. [name] Primary Care Tabatha Razzano: [name] Other Clinician: Referring Shynia Daleo: Treating Tabitha Tupper/Extender:[name], [name], [name] in Treatment: 22 Vital Signs Time Taken: 12:55 Temperature (F): 98.2 Height (in): 67 Pulse (bpm): 93 Weight (lbs): 125 Respiratory Rate (breaths/min): 18 Body Mass Index (BMI): 19.6 Blood Pressure (mmHg): 146/82 Reference Range: 80 - 120 mg / dl Electronic Signature(s) Signed: 07/13/2019 5:40:42 PM By: [name] Entered By: [name] on 07/13/2019 12:56:49

## 2019-07-15 NOTE — Progress Notes (Signed)
KIRSTON, LUTY ([id number]) Visit Report for 06/01/2019 Arrival Information Details Patient Name: Date of Service: JANEISHA, RYLE 06/01/2019 1:00 PM Medical Record Number:[id number] Patient Account Number: [id number] Date of Birth/Sex: Treating RN: 1971-02-23 (49 y.o. Nancy Fetter Primary Care Kaja Jackowski: [name] Other Clinician: Referring Candace Ramus: Treating Quana Chamberlain/Extender:[name], [name], [name] in Treatment: 59 Visit Information History Since Last Visit Added or deleted any medications: No Patient Arrived: Wheel Chair Any new allergies or adverse reactions: No Arrival Time: 12:49 Had a fall or experienced change in No activities of daily living that may affect Accompanied By: self risk of falls: Transfer Assistance: None Signs or symptoms of abuse/neglect since last No Patient Requires Transmission-Based No visito Precautions: Hospitalized since last visit: No Patient Has Alerts: No Implantable device outside of the clinic excluding No cellular tissue based products placed in the center since last visit: Has Dressing in Place as Prescribed: Yes Pain Present Now: No Electronic Signature(s) Signed: 07/15/2019 9:21:08 AM By: Sandre Kitty Entered By: Sandre Kitty on 06/01/2019 12:51:40 -------------------------------------------------------------------------------- Clinic Level of Care Assessment Details Patient Name: Date of Service: LILLEY, HUBBLE 06/01/2019 1:00 PM Medical Record Number:[id number] Patient Account Number: [id number] Date of Birth/Sex: Treating RN: [birth year] (49 y.o. Nancy Fetter Primary Care Paullette Mckain: [name] Other Clinician: Referring Sabastien Tyler: Treating Breianna Delfino/Extender:[name], [name], [name] in Treatment: 22 Clinic Level of Care Assessment Items TOOL 4 Quantity Score X - Use when only an EandM is performed on FOLLOW-UP visit 1 0 ASSESSMENTS - Nursing Assessment / Reassessment X  - Reassessment of Co-morbidities (includes updates in patient status) 1 10 X - Reassessment of Adherence to Treatment Plan 1 5 ASSESSMENTS - Wound and Skin Assessment / Reassessment X - Simple Wound Assessment / Reassessment - one wound 1 5 []  - Complex Wound Assessment / Reassessment - multiple wounds 0 []  - Dermatologic / Skin Assessment (not related to wound area) 0 ASSESSMENTS - Focused Assessment []  - Circumferential Edema Measurements - multi extremities 0 []  - Nutritional Assessment / Counseling / Intervention 0 X - Lower Extremity Assessment (monofilament, tuning fork, pulses) 1 5 []  - Peripheral Arterial Disease Assessment (using hand held doppler) 0 ASSESSMENTS - Ostomy and/or Continence Assessment and Care []  - Incontinence Assessment and Management 0 []  - Ostomy Care Assessment and Management (repouching, etc.) 0 PROCESS - Coordination of Care X - Simple Patient / Family Education for ongoing care 1 15 []  - Complex (extensive) Patient / Family Education for ongoing care 0 X - Staff obtains Programmer, systems, Records, Test Results / Process Orders 1 10 X - Staff telephones HHA, Nursing Homes / Clarify orders / etc 1 10 []  - Routine Transfer to another Facility (non-emergent condition) 0 []  - Routine Hospital Admission (non-emergent condition) 0 []  - New Admissions / Biomedical engineer / Ordering NPWT, Apligraf, etc. 0 []  - Emergency Hospital Admission (emergent condition) 0 X - Simple Discharge Coordination 1 10 []  - Complex (extensive) Discharge Coordination 0 PROCESS - Special Needs []  - Pediatric / Minor Patient Management 0 []  - Isolation Patient Management 0 []  - Hearing / Language / Visual special needs 0 []  - Assessment of Community assistance (transportation, D/C planning, etc.) 0 []  - Additional assistance / Altered mentation 0 []  - Support Surface(s) Assessment (bed, cushion, seat, etc.) 0 INTERVENTIONS - Wound Cleansing / Measurement X - Simple Wound Cleansing -  one wound 1 5 []  - Complex Wound Cleansing - multiple wounds 0 X - Wound Imaging (photographs - any number of wounds) 1 5 []  -  Wound Tracing (instead of photographs) 0 X - Simple Wound Measurement - one wound 1 5 []  - Complex Wound Measurement - multiple wounds 0 INTERVENTIONS - Wound Dressings X - Small Wound Dressing one or multiple wounds 1 10 []  - Medium Wound Dressing one or multiple wounds 0 []  - Large Wound Dressing one or multiple wounds 0 X - Application of Medications - topical 1 5 []  - Application of Medications - injection 0 INTERVENTIONS - Miscellaneous []  - External ear exam 0 []  - Specimen Collection (cultures, biopsies, blood, body fluids, etc.) 0 []  - Specimen(s) / Culture(s) sent or taken to Lab for analysis 0 []  - Patient Transfer (multiple staff / Civil Service fast streamer / Similar devices) 0 []  - Simple Staple / Suture removal (25 or less) 0 []  - Complex Staple / Suture removal (26 or more) 0 []  - Hypo / Hyperglycemic Management (close monitor of Blood Glucose) 0 []  - Ankle / Brachial Index (ABI) - do not check if billed separately 0 X - Vital Signs 1 5 Has the patient been seen at the hospital within the last three years: Yes Total Score: 105 Level Of Care: New/Established - Level 3 Electronic Signature(s) Signed: 06/01/2019 5:57:14 PM By: [name] RN, BSN Entered By: [name] on 06/01/2019 17:47:38 -------------------------------------------------------------------------------- Encounter Discharge Information Details Patient Name: Date of Service: [name] 06/01/2019 1:00 PM Medical Record Number:[id number] Patient Account Number: [id number] Date of Birth/Sex: Treating RN: [birth year] (49 y.o. [name] Primary Care Luby Seamans: [name] Other Clinician: Referring Joeanne Robicheaux: Treating Keonta Alsip/Extender:[name], [name], [name] in Treatment: 9 Encounter Discharge Information Items Discharge Condition: Stable Ambulatory Status:  Wheelchair Discharge Destination: Home Transportation: Private Auto Accompanied By: self Schedule Follow-up Appointment: Yes Clinical Summary of Care: Electronic Signature(s) Signed: 06/01/2019 5:15:23 PM By: Deon Pilling Entered By: Deon Pilling on 06/01/2019 14:16:34 -------------------------------------------------------------------------------- Lower Extremity Assessment Details Patient Name: Date of Service: ITALIA, WOLFERT 06/01/2019 1:00 PM Medical Record Number:[id number] Patient Account Number: [id number] Date of Birth/Sex: Treating RN: May 14, 1970 (49 y.o. [name] Primary Care Sutton Hirsch: [name] Other Clinician: Referring Braniyah Besse: Treating Ethelyn Cerniglia/Extender:[name], [name], [name] in Treatment: 38 Edema Assessment Assessed: [Left: No] [Right: No] Edema: [Left: N] [Right: o] Calf Left: Right: Point of Measurement: cm From Medial Instep cm 26 cm Ankle Left: Right: Point of Measurement: cm From Medial Instep cm 18 cm Vascular Assessment Pulses: Dorsalis Pedis Palpable: [Right:Yes] Electronic Signature(s) Signed: 06/02/2019 4:52:50 PM By: Baruch Gouty RN, BSN Entered By: Baruch Gouty on 06/01/2019 13:06:49 -------------------------------------------------------------------------------- Multi Wound Chart Details Patient Name: Date of Service: [name] 06/01/2019 1:00 PM Medical Record Number:[id number] Patient Account Number: [id number] Date of Birth/Sex: Treating RN: 03-05-1971 (49 y.o. 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] Comorbid History: [43:Cataracts, Chronic sinus N/A problems/congestion, Anemia, Sleep Apnea, Deep Vein Thrombosis, Hypertension, Peripheral Arterial Disease, Type I Diabetes, Osteoarthritis, Osteomyelitis, Neuropathy, Seizure Disorder] Date Acquired: [43:08/04/2018] [N/A:N/A] Weeks of Treatment: [43:38] [N/A:N/A] Wound Status: [43:Open] [N/A:N/A] Clustered Wound: [43:Yes] [N/A:N/A] Clustered Quantity: [43:3] [N/A:N/A] Measurements L x W x D 2.5x2x0.1 [N/A:N/A] (cm) Area (cm) : [43:3.927] [N/A:N/A] Volume (cm) : [01:6.010] [N/A:N/A] % Reduction in Area: [43:94.60%] [N/A:N/A] %  Reduction in Volume: 98.20% [N/A:N/A] Classification: [43:Grade 4] [N/A:N/A] Exudate Amount: [43:Medium] [N/A:N/A] Exudate Type: [43:Serosanguineous] [N/A:N/A] Exudate Color: [43:red, brown] [N/A:N/A] Wound Margin: [43:Flat and Intact] [N/A:N/A] Granulation Amount: [43:Medium (34-66%)] [N/A:N/A] Granulation Quality: [43:Red, Pink, Friable] [N/A:N/A] Necrotic Amount: [43:Medium (34-66%)] [N/A:N/A] Exposed Structures: [43:Fat Layer (Subcutaneous Tissue) Exposed: Yes Fascia: No Tendon: No Muscle: No Joint: No Bone: No Medium (34-66%)] [N/A:N/A N/A] Treatment Notes Wound #43 (Right, Medial Foot) 1. Cleanse With Wound Cleanser 3. Primary Dressing Applied Calcium Alginate Ag 4. Secondary Dressing Dry Gauze 6. Support Layer Psychologist, forensic Notes ace wrap patient request. Engineer, maintenance) Signed: 06/01/2019 5:45:55 PM By: Linton Ham MD Signed: 06/01/2019 5:57:14 PM By: [name] RN, BSN Entered By: Linton Ham on 06/01/2019 14:17:53 -------------------------------------------------------------------------------- Multi-Disciplinary Care Plan Details Patient Name: Date of Service: [name] 06/01/2019 1:00 PM Medical Record Number:[id number] Patient  Account Number: [id number] Date of Birth/Sex: Treating RN: 09/20/1970 (49 y.o. Nancy Fetter Primary Care Rafel Garde: [name] Other Clinician: Referring Stafford Riviera: Treating Joeseph Verville/Extender:[name], [name], [name] in Treatment: 91 Active Inactive Wound/Skin Impairment Nursing Diagnoses: Impaired tissue integrity Knowledge deficit related to ulceration/compromised skin integrity Goals: Patient/caregiver will verbalize understanding of skin care regimen Date Initiated: 09/03/2018 Target Resolution Date: 07/03/2019 Goal Status: Active Ulcer/skin breakdown will have a volume reduction of 30% by week 4 Date Initiated: 09/03/2018 Date Inactivated: 10/07/2018 Target Resolution Date: 10/01/2018 Unmet Goal Status: Unmet Reason: COMORBITIES Ulcer/skin breakdown will have a volume reduction of 50% by week 8 Date Initiated: 10/07/2018 Date Inactivated: 11/03/2018 Target Resolution Date: 10/31/2018 Unmet Goal Status: Unmet Reason: Osteomyelitis Interventions: Assess patient/caregiver ability to obtain necessary supplies Assess patient/caregiver ability to perform ulcer/skin care regimen upon admission and as needed Assess ulceration(s) every visit Provide education on ulcer and skin care Treatment Activities: Skin care regimen initiated : 09/03/2018 Topical wound management initiated : 09/03/2018 Notes: Electronic Signature(s) Signed: 06/01/2019 5:57:14 PM By: [name] RN, BSN Entered By: [name] on 06/01/2019 17:46:47 -------------------------------------------------------------------------------- Pain Assessment Details Patient Name: Date of Service: [name] 06/01/2019 1:00 PM Medical Record Number:[id number] Patient Account Number: [id number] Date of Birth/Sex: Treating RN: 1970-12-15 (49 y.o. Nancy Fetter Primary Care Laira Penninger: [name] Other Clinician: Referring Stacy Sailer: Treating Edlyn Rosenburg/Extender:[name], [name], [name] in  Treatment: 33 Active Problems Location of Pain Severity and Description of Pain Patient Has Paino No Site Locations Pain Management and Medication Current Pain Management: Electronic Signature(s) Signed: 06/01/2019 5:57:14 PM By: [name] RN, BSN Signed: 07/15/2019 9:21:08 AM By: Sandre Kitty Entered By: Sandre Kitty on 06/01/2019 12:52:11 -------------------------------------------------------------------------------- Patient/Caregiver Education Details Patient Name: Date of Service: [name] 3/1/2021andnbsp1:00 PM Medical Record Number:[id number] Patient Account Number: [id number] Date of Birth/Gender: Treating RN: 08-11-1970 (49 y.o. Nancy Fetter Primary Care Physician: [name] Other Clinician: Referring Physician: Treating Physician/Extender:[name], [name], [name] in Treatment: 40 Education Assessment Education Provided To: Patient Education Topics Provided Wound/Skin Impairment: Methods: Explain/Verbal Responses: State content correctly Electronic Signature(s) Signed: 06/01/2019 5:57:14 PM By: [name] RN, BSN Entered By: [name] on 06/01/2019 17:46:59 -------------------------------------------------------------------------------- Wound Assessment Details Patient Name: Date of Service: [name] 06/01/2019 1:00 PM Medical Record Number:[id number] Patient Account Number: [id number] Date of Birth/Sex: Treating RN: 03-Nov-1970 (49 y.o. Nancy Fetter Primary Care Sania Noy: [name] Other Clinician: Referring Mannix Kroeker: Treating Zlatan Hornback/Extender:[name], [name], [name] in Treatment: 25 Wound Status Wound Number: 43 Primary Diabetic Wound/Ulcer of the Lower Extremity Etiology: Wound Location: Right Foot - Medial Wound Open Wounding Event: Gradually Appeared Status: Date Acquired:  08/04/2018 Comorbid Cataracts, Chronic sinus problems/congestion, Weeks Of Treatment: 38 History:  Anemia, Sleep Apnea, Deep Vein Thrombosis, Clustered Wound: Yes Hypertension, Peripheral Arterial Disease, Type I Diabetes, Osteoarthritis, Osteomyelitis, Neuropathy, Seizure Disorder Photos Wound Measurements Length: (cm) 2.5 % Reduction in Width: (cm) 2 % Reduction in Depth: (cm) 0.1 Epithelializati Clustered Quantity: 3 Tunneling: Area: (cm) 3.927 Undermining: Volume: (cm) 0.393 Wound Description Classification: Grade 4 Foul Odor After Wound Margin: Flat and Intact Slough/Fibrino Exudate Amount: Medium Exudate Type: Serosanguineous Exudate Color: red, brown Wound Bed Granulation Amount: Medium (34-66%) Granulation Quality: Red, Pink, Friable Fascia Exposed: Necrotic Amount: Medium (34-66%) Fat Layer (Subc Necrotic Quality: Adherent Slough Tendon Exposed: Muscle Exposed: Joint Exposed: Bone Exposed: Electronic Signature(s) Signed: 06/04/2019 3:59:23 PM By: [name] EMT/HBOT Signed: 06/04/2019 5:39:26 PM By: [name] RN, BSN Previous Signature: 06/02/2019 4:52:50 PM Version By: Lourdes Sledge Entered By: [name] on 03/04 Cleansing: No Yes Exposed Structure No utaneous Tissue) Exposed: Yes No No No No da RN, BSN /2021 10:40:25 Area: 94.6% Volume: 98.2% on: Medium (34-66%) No No -------------------------------------------------------------------------------- Vitals Details Patient Name: Date of Service: BETHANI, BRUGGER 06/01/2019 1:00 PM Medical Record Number:[id number] Patient Account Number: [id number] Date of Birth/Sex: Treating RN: 09-Sep-1970 (49 y.o. Nancy Fetter Primary Care Fuquan Wilson: [name] Other Clinician: Referring Motty Borin: Treating Natale Barba/Extender:[name], [name], [name] in Treatment: 12 Vital Signs Time Taken: 12:51 Temperature (F): 98.3 Height (in): 67 Pulse (bpm): 81 Weight (lbs): 125 Respiratory Rate (breaths/min): 16 Body Mass Index (BMI): 19.6 Blood Pressure (mmHg): 137/70 Reference  Range: 80 - 120 mg / dl Electronic Signature(s) Signed: 07/15/2019 9:21:08 AM By: Sandre Kitty Entered By: Sandre Kitty on 06/01/2019 12:51:58

## 2019-07-18 ENCOUNTER — Telehealth: Payer: Self-pay | Admitting: Nutrition

## 2019-07-18 NOTE — Telephone Encounter (Signed)
Message left on machine to charge pump before coming to appointment on Monday, as well as to download 2 apps to her phone.  Those are the Dexcom G6 mobile app, and the T-connect app.  She was told to log into each app with user name and password, and to write them down and she will need to give me those passwords when she comes in to her appointment.

## 2019-07-20 ENCOUNTER — Telehealth: Payer: Self-pay | Admitting: Nutrition

## 2019-07-20 ENCOUNTER — Encounter: Payer: Medicare HMO | Attending: Endocrinology | Admitting: Nutrition

## 2019-07-20 ENCOUNTER — Other Ambulatory Visit: Payer: Self-pay

## 2019-07-20 DIAGNOSIS — E1065 Type 1 diabetes mellitus with hyperglycemia: Secondary | ICD-10-CM

## 2019-07-20 NOTE — Telephone Encounter (Signed)
Another message left at 8PM, that if she had difficulty with pump, to call help line.

## 2019-07-20 NOTE — Telephone Encounter (Signed)
Message left on machine that I wanted to know how she was doing with her new pump.  She was told tht if she has difficulty, to call the Tandem  800 help line telephone number if she has any difficulty with using her pump.

## 2019-07-20 NOTE — Patient Instructions (Signed)
Read over handouts given and pump manual Read over manual on Dexcom Call office if blood sugar readings drop low, or remain over 250.   Call Tandem help line if questions on how to use the pump.

## 2019-07-20 NOTE — Progress Notes (Signed)
This patient was trained on the Tandem pump.  Settings were transferred from her Paradigm pump:  Basal rate: MN: 0.6, 3AM: 0.5,  6AM: 0.55, 11AM: 0.4, 1PM: 0.85, 3PM: 0.75, 7PM: 0.85,     I/C: 15, Sif: MN: 70, 8AM: 70, 10PM: 70  Target 150, Timing 4 hours.   She was started and trained on the Dexcom and attached a sensor to her left upper outer arm, without any difficulty.  It was linked to her Tandem pump.  Control IQ was turned on.   She filled a cartridge and attached an infusion set.   She was show how to give a bolus and she re demonstrated this X3 without difficulty.  She was given instruction sheets for all of the above:  How to fill a cartridge, how to give a bolus, how to change pump settings, how to view the status screen, how to start/stop the pump and how to read the symbols on the control IQ read out.  She was strongly encouraged to read all of the handouts given as we will review all of the above again tomorrow after seeing Dr. Dwyane Dee She had no final questions.

## 2019-07-21 ENCOUNTER — Encounter: Payer: Self-pay | Admitting: Endocrinology

## 2019-07-21 ENCOUNTER — Ambulatory Visit (INDEPENDENT_AMBULATORY_CARE_PROVIDER_SITE_OTHER): Payer: Medicare HMO | Admitting: Endocrinology

## 2019-07-21 ENCOUNTER — Encounter: Payer: Medicare HMO | Admitting: Nutrition

## 2019-07-21 ENCOUNTER — Telehealth: Payer: Self-pay | Admitting: Nutrition

## 2019-07-21 VITALS — BP 140/74 | HR 88 | Ht 67.5 in | Wt 125.8 lb

## 2019-07-21 DIAGNOSIS — E1065 Type 1 diabetes mellitus with hyperglycemia: Secondary | ICD-10-CM | POA: Diagnosis not present

## 2019-07-21 LAB — POCT GLYCOSYLATED HEMOGLOBIN (HGB A1C): Hemoglobin A1C: 11.7 % — AB (ref 4.0–5.6)

## 2019-07-21 NOTE — Telephone Encounter (Signed)
Patient did not stay to see me to finish her pump training.  She is not coming in tomorrow.  Says she can make pump changes.  Did not gvie a reason why she did not stay.  Will see her next Monday at another MD office to finish the training and have her sign off on the checklist.  She reported no difficulty sleeping with pump, or giving boluses.  Says "pump is working like a dream".  Reminded her of the need to bolus before all meals, so that her Control IQ will work more efficiently.  She reported good understanding of this.

## 2019-07-21 NOTE — Progress Notes (Signed)
Patient ID: [name], female   DOB: January 17, 1971, 49 y.o.   MRN: [id number]   Reason for Appointment: follow-up of diabetes  History of Present Illness   Diagnosis: Type 1 DIABETES MELITUS  Previous history: She has had long-standing poorly controlled diabetes and typically has poor compliance with self care measures despite periodic diabetes education and periodic followup  INSULIN PUMP regimen:   Current insulin pump: T-insulin as of 07/20/2019   Basal rates: Midnight to 6 AM = 0.6, 3 AM = 0.5, 6 AM-11 AM: 0.55, 11 AM-1 PM = 0.4, 1 PM-3 PM = 0.4, 3 PM- 7 PM = 0.75, 7 PM-12 AM = 0.85   Carb coverage 1: 15 g and correction 1: 70 Active insulin 4 hours CGM alerts: 80 and 200, rise and fall alerts are off         Recent history:   Her last A1c was   9.3 previously and now 11.7  Current blood sugar patterns, difficulties with management and problems identified:   She just started using the T-insulin pump yesterday  Although she has been asked to make sure she is bolusing for all her meals and snacks she only bolused once for 20 g carbohydrate intake at about 8 PM last evening  Last night had a roast beef style sandwich at around 2 AM and blood sugar started increasing and was around 230 by around 4 AM  Prior to her meal last night her average CGM was down to 106 around midnight  By her colonoscopy sandwich continues to have poor control although A1c is improved as above  Blood sugar subsequently came down close to 100  Today during dialysis her blood sugar started decreasing and even with the control IQ suspending her basal delivery her blood sugar went down to about 65.  She says she did suspend her pump for a few minutes because of fear of low sugar  Today at lunchtime she again did not bolus and she does not explain why, her blood sugar is now 261 and she has not done any correction boluses also  AVERAGE blood sugar since yesterday overall 154  PREVIOUS  blood sugars:   PRE-MEAL  morning  midday  evening  midnight-6 AM Overall  Glucose range:  169-590  95-398  119-524  82-600+   Mean/median:  369  330  250  AC 384, PC 358  351   POST-MEAL PC Breakfast PC Lunch  6 PM +  Glucose range:    64-600  Mean/median:    400      Meals: eating at variable times and also having snacks periodically.  Her daily carbohydrate intake is quite variable Physical activity: exercise: none         Dietician visit: Most recent:?          Complications: are: Neuropathy, nephropathy, diabetic foot ulcer, amputation    Wt Readings from Last 3 Encounters:  07/21/19 125 lb 12.8 oz (57.1 kg)  06/03/19 126 lb (57.2 kg)  04/29/19 126 lb (57.2 kg)   Lab Results  Component Value Date   HGBA1C 11.7 (A) 07/21/2019   HGBA1C 9.3 (A) 02/04/2019   HGBA1C 11.4 (A) 10/15/2018   Lab Results  Component Value Date   MICROALBUR 304.2 (H) 11/11/2017   LDLCALC 78 05/02/2016   CREATININE 3.70 (H) 04/29/2019   Office Visit on 07/21/2019  Component Date Value Ref Range Status  . Hemoglobin A1C 07/21/2019 11.7* 4.0 - 5.6 % Final  Other problems addressed:: See review of systems   Allergies as of 07/21/2019      Reactions   Cleocin [clindamycin Hcl] Diarrhea   Lisinopril Other (See Comments)   Elevated potassium per pt report   Amoxicillin Diarrhea, Nausea Only, Other (See Comments)   Did it involve swelling of the face/tongue/throat, SOB, or low BP? No Did it involve sudden or severe rash/hives, skin peeling, or any reaction on the inside of your mouth or nose? No Did you need to seek medical attention at a hospital or doctor's office? No When did it last happen?10 + years If all above answers are "NO", may proceed with cephalosporin use.   Bactrim [sulfamethoxazole-trimethoprim] Diarrhea, Nausea Only      Medication List       Accurate as of July 21, 2019  2:49 PM. If you have any questions, ask your nurse or doctor.        STOP taking these  medications   APPLE CIDER VINEGAR PO Stopped by: Elayne Snare, MD   FLUoxetine 20 MG capsule Commonly known as: PROZAC Stopped by: Elayne Snare, MD     TAKE these medications   albuterol 108 (90 Base) MCG/ACT inhaler Commonly known as: VENTOLIN HFA Inhale 2 puffs into the lungs every 6 (six) hours as needed for wheezing or shortness of breath.   aspirin EC 81 MG tablet Take 81 mg by mouth daily.   B-12 5000 MCG Caps Take 5,000 mcg by mouth daily.   calcitRIOL 0.5 MCG capsule Commonly known as: ROCALTROL Take 1 capsule daily What changed:   how much to take  how to take this  when to take this  additional instructions   calcium acetate 667 MG capsule Commonly known as: PHOSLO Take 2 capsules (1,334 mg total) by mouth 3 (three) times daily with meals.   Contour Next Test test strip Generic drug: glucose blood USE AS INSTRUCTED TO CHECK BLOOD SUGAR 4 TIMES A DAY. DX E10.65   Dexcom G6 Receiver Devi 1 each by Does not apply route See admin instructions. Use Dexcom Receiver to monitor blood sugar continuously.   Dexcom G6 Sensor Misc 1 each by Does not apply route See admin instructions. Use one sensors once every 10 days to monitor blood sugars.   Dexcom G6 Transmitter Misc 1 each by Does not apply route See admin instructions. Use one transmitter once every 90 days.   DIALYVITE 800 WITH ZINC 0.8 MG Tabs Take 1 tablet by mouth daily.   diphenhydrAMINE 25 MG tablet Commonly known as: BENADRYL Take 25 mg by mouth daily as needed.   folic acid 161 MCG tablet Commonly known as: FOLVITE Take 400 mcg by mouth daily.   insulin aspart 100 UNIT/ML injection Commonly known as: NovoLOG USE A MAX OF 65 UNITS DAILY VIA INSULIN PUMP.   insulin pump Soln Inject into the skin. Insulin aspart (Novolog) 100 unit/ml---MAX 65 UNITS DAILY   norethindrone 5 MG tablet Commonly known as: AYGESTIN Take 5 mg by mouth See admin instructions.   Omega 3 500 500 MG Caps Take 500 mg  by mouth daily.   ondansetron 4 MG tablet Commonly known as: ZOFRAN Take 4 mg by mouth every 8 (eight) hours as needed for nausea or vomiting.   oxyCODONE-acetaminophen 5-325 MG tablet Commonly known as: PERCOCET/ROXICET Take 1 tablet by mouth every 6 (six) hours as needed.   pravastatin 40 MG tablet Commonly known as: PRAVACHOL TAKE 1 TABLET BY MOUTH EVERY DAY   sulfamethoxazole-trimethoprim 800-160  MG tablet Commonly known as: BACTRIM DS Take 1 tablet by mouth daily.       Allergies:  Allergies  Allergen Reactions  . Cleocin [Clindamycin Hcl] Diarrhea  . Lisinopril Other (See Comments)    Elevated potassium per pt report  . Amoxicillin Diarrhea, Nausea Only and Other (See Comments)    Did it involve swelling of the face/tongue/throat, SOB, or low BP? No Did it involve sudden or severe rash/hives, skin peeling, or any reaction on the inside of your mouth or nose? No Did you need to seek medical attention at a hospital or doctor's office? No When did it last happen?10 + years If all above answers are "NO", may proceed with cephalosporin use.    . Bactrim [Sulfamethoxazole-Trimethoprim] Diarrhea and Nausea Only    Past Medical History:  Diagnosis Date  . Anemia   . Arthritis   . Bronchitis   . C. difficile diarrhea 09/26/2014  . Cataracts, both eyes    had surgery to remove  . Cellulitis of right foot 06/02/2014  . CKD (chronic kidney disease) stage 3, GFR 30-59 ml/min    Dialysis T, TH, Sat  . Diabetic ulcer of right foot ([place]) 06/02/2014  . DVT (deep venous thrombosis) ([place]) 10/2014   one in each one in leg- PICC line , one in right and left arm  . H/O seasonal allergies   . Heart murmur    told once when she was pregnant- no furter mention- was in notes 11/2015- had Echo 8 /4/17  . History of blood transfusion   . Hypercholesteremia   . Hypertension   . Left foot infection   . Neuromuscular disorder ([place])    lower legs and feet  . Neuropathy    feet  .  Neuropathy in diabetes ([place])   . Peripheral vascular disease ([place])   . Sleep apnea    does not use cpap  . Staphylococcus aureus bacteremia 10/2014  . Type 1 diabetes ([place])    onset age 32    Past Surgical History:  Procedure Laterality Date  . AMPUTATION TOE Left 07/24/2015   5th toe and Hallux amputation  . AV FISTULA PLACEMENT Left 08/06/2018   Procedure: INSERTION OF BASCILIC VEN TRANSPOSITION 1ST STAGE;  Surgeon: Rosetta Posner, MD;  Location: [facility];  Service: Vascular;  Laterality: Left;  . BASCILIC VEIN TRANSPOSITION Left 04/29/2019   Procedure: SECOND STAGE BASCILIC VEIN TRANSPOSITION LEFT ARM;  Surgeon: Rosetta Posner, MD;  Location: [facility];  Service: Vascular;  Laterality: Left;  . CESAREAN SECTION     x 1  . EXOSTECTECTOMY TOE Left 04/11/2016   Procedure: EXOSTECTECTOMY CUBOID AND 5TH METATARSAL AND PLACEMENT OF ANTIBIOTIC BEADS;  Surgeon: Edrick Kins, DPM;  Location: [facility];  Service: Podiatry;  Laterality: Left;  . EYE SURGERY Bilateral    removed cataracts - laser  . FEMORAL-POPLITEAL BYPASS GRAFT Left 05/03/2015  . FOOT AMPUTATION THROUGH METATARSAL Right 2016  . hemrrhoidectomy    . I & D EXTREMITY Right 06/10/2014   Procedure: IRRIGATION AND DEBRIDEMENT Right Foot;  Surgeon: Newt Minion, MD;  Location: [facility];  Service: Orthopedics;  Laterality: Right;  . I & D EXTREMITY Right 07/31/2018   Procedure: IRRIGATION AND DEBRIDEMENT EXTREMITY;  Surgeon: Edrick Kins, DPM;  Location: [facility];  Service: Podiatry;  Laterality: Right;  . I & D EXTREMITY Right 08/04/2018   Procedure: IRRIGATION AND DEBRIDEMENT FOOT with placement of wound vac;  Surgeon: Edrick Kins, DPM;  Location: [facility];  Service: Podiatry;  Laterality: Right;  . INCISION AND DRAINAGE Left 04/11/2016   Procedure: INCISION AND DRAINAGE A DEEP COMPLICATED WOUND OF LEFT FOOT;  Surgeon: Edrick Kins, DPM;  Location: [facility];  Service: Podiatry;  Laterality: Left;  . INSERTION OF DIALYSIS CATHETER Right 08/06/2018    Procedure: INSERTION OF TUNNELED DIALYSIS CATHETER;  Surgeon: Rosetta Posner, MD;  Location: [facility];  Service: Vascular;  Laterality: Right;  . IR FLUORO GUIDE CV LINE RIGHT  09/24/2017  . IR FLUORO GUIDE CV LINE RIGHT  08/01/2018  . IR REMOVAL TUN CV CATH W/O FL  11/22/2017  . IR US GUIDE VASC ACCESS RIGHT  09/24/2017  . IR US GUIDE VASC ACCESS RIGHT  08/01/2018  . IRRIGATION AND DEBRIDEMENT ABSCESS Left 05/06/2017   Procedure: IRRIGATION AND DEBRIDEMENT ABSCESS LEFT FOOT;  Surgeon: Edrick Kins, DPM;  Location: [facility];  Service: Podiatry;  Laterality: Left;  . PERIPHERALLY INSERTED CENTRAL CATHETER INSERTION    . SKIN GRAFT Right 06/15/2014  . SKIN GRAFT Left 06/2015   foot   . SKIN SPLIT GRAFT Right 06/15/2014   Procedure: SPLIT THICKNESS SKIN GRAFT RIGHT FOOT;  Surgeon: Newt Minion, MD;  Location: [facility];  Service: Orthopedics;  Laterality: Right;  . TRANSMETATARSAL AMPUTATION Right   . TRANSMETATARSAL AMPUTATION Right 09/11/2014  . WISDOM TOOTH EXTRACTION      Family History  Problem Relation Age of Onset  . Hyperlipidemia Mother   . Dementia Mother     Social History:  reports that she quit smoking about 11 years ago. She quit after 15.00 years of use. She has never used smokeless tobacco. She reports previous alcohol use. She reports that she does not use drugs.  Review of Systems:   Hypertension/CKD:   Is on dialysis  Currently not on any antihypertensive  BP Readings from Last 3 Encounters:  07/21/19 140/74  06/03/19 (!) 169/87  04/29/19 137/64      Lipids: Has been  treated with pravastatin 40 mg  Followed by PCP   Lab Results  Component Value Date   CHOL 152 06/12/2018   HDL 24.40 (L) 06/12/2018   LDLCALC 78 05/02/2016   LDLDIRECT 82.0 06/12/2018   TRIG 222.0 (H) 06/12/2018   CHOLHDL 6 06/12/2018      Chronic anemia, followed by nephrologist and PCP:  Lab Results  Component Value Date   WBC 6.1 11/10/2018   HGB 11.2 (L) 04/29/2019   HCT 33.0 (L)  04/29/2019   MCV 73.9 (L) 11/10/2018   PLT 370 11/10/2018    She has a Charcot foot, 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      ASSESSMENT/ PLAN:   Diabetes type 1 with poor control and multiple complications      See history of present illness for detailed discussion of current diabetes management, blood sugar patterns and problems identified  A1c is now 11.7  She has finally started the closed-loop insulin pump with the T-Slim device Control IQ has been turned on  Her day today with actions as well as her CGM was reviewed in detail today  Blood  sugars are significantly better compared to when she was in the manual mode on the insulin pumps However she has not bolused for her meals except once and this is causing most of her hyperglycemia since yesterday She appears to be fairly comfortable counting carbohydrates Although she thinks she can make changes on her own and she is not able to program her pump in the office today  She does appear to have progressively lower sugars with her dialysis routine which happened this morning although blood sugars were not below 65  PLAN:     She will turn on the exercise mode when going in for dialysis for 4 hours  Since she is very reluctant to do adequate boluses for her meals for fear of hypoglycemia she can change the carbohydrate ratio to 1: 20 for all meals.  She says she will try to do this on her own but call the support line if needed  Make sure to bolus before starting to eat  She does not need to disconnect or suspend her pump when blood sugars are low normal or slightly low since she should be able to avoid hypoglycemia with the control IQ  She needs to  do correction boluses if blood sugars are unexpectedly high  Review blood sugars and virtual visit tomorrow is okay, she is currently linked to the online account for T-connect  Patient Instructions  Start exercise activity for 4 hrs when in dialysis  Carb ratio 1:20 for all meals  Do not suspend       Gaynor Ferreras 07/21/2019, 2:49 PM      Elayne Snare 07/21/19

## 2019-07-21 NOTE — Telephone Encounter (Signed)
She was unsure about how to change her carbohydrate ratios, need to make sure that she has programmed to 1: 20.  Please see my notes.  She left the office because her bus was coming

## 2019-07-21 NOTE — Patient Instructions (Signed)
Start exercise activity for 4 hrs when in dialysis  Carb ratio 1:20 for all meals  Do not suspend

## 2019-07-22 ENCOUNTER — Encounter: Payer: Medicare HMO | Admitting: Nutrition

## 2019-07-22 ENCOUNTER — Telehealth (INDEPENDENT_AMBULATORY_CARE_PROVIDER_SITE_OTHER): Payer: Medicare HMO | Admitting: Endocrinology

## 2019-07-22 ENCOUNTER — Telehealth: Payer: Self-pay | Admitting: Nutrition

## 2019-07-22 ENCOUNTER — Encounter: Payer: Self-pay | Admitting: Endocrinology

## 2019-07-22 DIAGNOSIS — E1065 Type 1 diabetes mellitus with hyperglycemia: Secondary | ICD-10-CM

## 2019-07-22 NOTE — Telephone Encounter (Signed)
Please walk her through the changes of carb ratio 1:20.  Otherwise she will need to come into the office today

## 2019-07-22 NOTE — Telephone Encounter (Signed)
I talked her making the changes to her carb ratio, and she did this without difficulty.  They are all set to 1u/20g.  She was reminded to bolus for all meals and snacks, and she agreed to do this.

## 2019-07-22 NOTE — Telephone Encounter (Signed)
Please confirm she is using a 1: 20 carbohydrate ratio.  She will need to give me her blood sugar readings for today's visit

## 2019-07-22 NOTE — Telephone Encounter (Signed)
Pt is not using a ratio of 1:20. She stated that she tried changing it, but it would not let her make the changes. She could view what the current ratio is set at though. Current carb ratio is 1:15.  Sensor is also not working. Sensor stopped working at approx. 4pm. She called company, and they stated that they would send a replacement.  Pt states that her sensor is currently working because she replaced it after speaking to the company, and her blood sugars are reporting to the cloud.

## 2019-07-22 NOTE — Telephone Encounter (Signed)
I phoned her to talk her into making the changes on her Tandem pump.  She said her Dexcom stopped working last night, and she had to call them to get a new one.  She is testing blood sugars now and putting them in the bolus calculator, but said she did not want to make the changes "just yet".  She wants to wait until she gets her Dexcom working again.  I explained that this will give her less insulin for each meal, so that she does not drop low, but she insisted that she wanted to wait.  ???  I am seeing her Monday to finish her training, and will make the changes at that time.  She agreed to this suggestion.

## 2019-07-22 NOTE — Telephone Encounter (Signed)
Settings changed per Dr. Ronnie Derby written orders: 11:00 Am was changed to 9AM, and at 1PM the basal rate was changed to 0,3u/hr.  The carb ratio was set to 20 before making these changes.

## 2019-07-22 NOTE — Progress Notes (Signed)
Patient ID: [name], female   DOB: 1970/08/25, 49 y.o.   MRN: [id number]  I connected with the above-named patient by video enabled telemedicine application and verified that I am speaking with the correct person. The patient was explained the limitations of evaluation and management by telemedicine and the availability of in person appointments.  Patient also understood that there may be a patient responsible charge related to this service . Location of the patient: Patient's home . Location of the provider: Physician office Only the patient and myself were participating in the encounter The patient understood the above statements and agreed to proceed.  Reason for Appointment: follow-up of diabetes  History of Present Illness   Diagnosis: Type 1 DIABETES MELITUS  Previous history: She has had long-standing poorly controlled diabetes and typically has poor compliance with self care measures despite periodic diabetes education and periodic followup  INSULIN PUMP regimen:   Current insulin pump: T-insulin as of 07/20/2019   Basal rates: Midnight to 6 AM = 0.6, 3 AM = 0.5, 6 AM-11 AM: 0.55, 11 AM-1 PM = 0.4, 1 PM-3 PM = 0.4, 3 PM- 7 PM = 0.75, 7 PM-12 AM = 0.85   Carb coverage 1: 15 g and correction 1: 70 Active insulin 4 hours CGM alerts: 80 and 200, rise and fall alerts are off         Recent history:   Her last A1c was   9.3 previously and now 11.7  Current blood sugar patterns, difficulties with management and problems identified:   She started using the T-insulin pump 2 days ago  She still does not appear to be bolusing for her meals as directed  Her sensor malfunctioned yesterday around 4 PM but she did not enter more than 1 or 2 blood sugars manually in the pump  Blood sugar was 213 at bedtime likely to be from not bolusing for her intake in the evening  At about 4 AM she had been rice and chicken wings and beans and did not bolus because of the  blood sugar to go up from 150 to about 310  However she is already will do a correction bolus manually  Today her blood sugar was down to about 63 at 10 AM also down to 183 at about 3-4 PM about 1 without any additional boluses  Has not had any meals today  She was told to program her carbohydrate ratio to 1: 20 but not clear if she has done this  AVERAGE blood sugar since yesterday overall 154  PREVIOUS blood sugars:   PRE-MEAL  morning  midday  evening  midnight-6 AM Overall  Glucose range:  169-590  95-398  119-524  82-600+   Mean/median:  369  330  250  AC 384, PC 358  351   POST-MEAL PC Breakfast PC Lunch  6 PM +  Glucose range:    64-600  Mean/median:    400      Meals: eating at variable times and also having snacks periodically.  Her daily carbohydrate intake is quite variable Physical activity: exercise: none         Dietician visit: Most recent:?          Complications: are: Neuropathy, nephropathy, diabetic foot ulcer, amputation    Wt Readings from Last 3 Encounters:  07/21/19 125 lb 12.8 oz (57.1 kg)  06/03/19 126 lb (57.2 kg)  04/29/19 126 lb (57.2 kg)   Lab Results  Component Value Date  HGBA1C 11.7 (A) 07/21/2019   HGBA1C 9.3 (A) 02/04/2019   HGBA1C 11.4 (A) 10/15/2018   Lab Results  Component Value Date   MICROALBUR 304.2 (H) 11/11/2017   LDLCALC 78 05/02/2016   CREATININE 3.70 (H) 04/29/2019   Office Visit on 07/21/2019  Component Date Value Ref Range Status  . Hemoglobin A1C 07/21/2019 11.7* 4.0 - 5.6 % Final    Other problems addressed:: See review of systems   Allergies as of 07/22/2019      Reactions   Cleocin [clindamycin Hcl] Diarrhea   Lisinopril Other (See Comments)   Elevated potassium per pt report   Amoxicillin Diarrhea, Nausea Only, Other (See Comments)   Did it involve swelling of the face/tongue/throat, SOB, or low BP? No Did it involve sudden or severe rash/hives, skin peeling, or any reaction on the inside of your mouth  or nose? No Did you need to seek medical attention at a hospital or doctor's office? No When did it last happen?10 + years If all above answers are "NO", may proceed with cephalosporin use.   Bactrim [sulfamethoxazole-trimethoprim] Diarrhea, Nausea Only      Medication List       Accurate as of July 22, 2019  3:38 PM. If you have any questions, ask your nurse or doctor.        albuterol 108 (90 Base) MCG/ACT inhaler Commonly known as: VENTOLIN HFA Inhale 2 puffs into the lungs every 6 (six) hours as needed for wheezing or shortness of breath.   aspirin EC 81 MG tablet Take 81 mg by mouth daily.   B-12 5000 MCG Caps Take 5,000 mcg by mouth daily.   calcitRIOL 0.5 MCG capsule Commonly known as: ROCALTROL Take 1 capsule daily What changed:   how much to take  how to take this  when to take this  additional instructions   calcium acetate 667 MG capsule Commonly known as: PHOSLO Take 2 capsules (1,334 mg total) by mouth 3 (three) times daily with meals.   Contour Next Test test strip Generic drug: glucose blood USE AS INSTRUCTED TO CHECK BLOOD SUGAR 4 TIMES A DAY. DX E10.65   Dexcom G6 Receiver Devi 1 each by Does not apply route See admin instructions. Use Dexcom Receiver to monitor blood sugar continuously.   Dexcom G6 Sensor Misc 1 each by Does not apply route See admin instructions. Use one sensors once every 10 days to monitor blood sugars.   Dexcom G6 Transmitter Misc 1 each by Does not apply route See admin instructions. Use one transmitter once every 90 days.   DIALYVITE 800 WITH ZINC 0.8 MG Tabs Take 1 tablet by mouth daily.   diphenhydrAMINE 25 MG tablet Commonly known as: BENADRYL Take 25 mg by mouth daily as needed.   folic acid 092 MCG tablet Commonly known as: FOLVITE Take 400 mcg by mouth daily.   insulin aspart 100 UNIT/ML injection Commonly known as: NovoLOG USE A MAX OF 65 UNITS DAILY VIA INSULIN PUMP.   insulin pump  Soln Inject into the skin. Insulin aspart (Novolog) 100 unit/ml---MAX 65 UNITS DAILY   norethindrone 5 MG tablet Commonly known as: AYGESTIN Take 5 mg by mouth See admin instructions.   Omega 3 500 500 MG Caps Take 500 mg by mouth daily.   ondansetron 4 MG tablet Commonly known as: ZOFRAN Take 4 mg by mouth every 8 (eight) hours as needed for nausea or vomiting.   oxyCODONE-acetaminophen 5-325 MG tablet Commonly known as: PERCOCET/ROXICET Take 1 tablet  by mouth every 6 (six) hours as needed.   pravastatin 40 MG tablet Commonly known as: PRAVACHOL TAKE 1 TABLET BY MOUTH EVERY DAY   sulfamethoxazole-trimethoprim 800-160 MG tablet Commonly known as: BACTRIM DS Take 1 tablet by mouth daily.       Allergies:  Allergies  Allergen Reactions  . Cleocin [Clindamycin Hcl] Diarrhea  . Lisinopril Other (See Comments)    Elevated potassium per pt report  . Amoxicillin Diarrhea, Nausea Only and Other (See Comments)    Did it involve swelling of the face/tongue/throat, SOB, or low BP? No Did it involve sudden or severe rash/hives, skin peeling, or any reaction on the inside of your mouth or nose? No Did you need to seek medical attention at a hospital or doctor's office? No When did it last happen?10 + years If all above answers are "NO", may proceed with cephalosporin use.    . Bactrim [Sulfamethoxazole-Trimethoprim] Diarrhea and Nausea Only    Past Medical History:  Diagnosis Date  . Anemia   . Arthritis   . Bronchitis   . C. difficile diarrhea 09/26/2014  . Cataracts, both eyes    had surgery to remove  . Cellulitis of right foot 06/02/2014  . CKD (chronic kidney disease) stage 3, GFR 30-59 ml/min    Dialysis T, TH, Sat  . Diabetic ulcer of right foot ([place]) 06/02/2014  . DVT (deep venous thrombosis) ([place]) 10/2014   one in each one in leg- PICC line , one in right and left arm  . H/O seasonal allergies   . Heart murmur    told once when she was pregnant- no furter  mention- was in notes 11/2015- had Echo 8 /4/17  . History of blood transfusion   . Hypercholesteremia   . Hypertension   . Left foot infection   . Neuromuscular disorder ([place])    lower legs and feet  . Neuropathy    feet  . Neuropathy in diabetes ([place])   . Peripheral vascular disease ([place])   . Sleep apnea    does not use cpap  . Staphylococcus aureus bacteremia 10/2014  . Type 1 diabetes ([place])    onset age 23    Past Surgical History:  Procedure Laterality Date  . AMPUTATION TOE Left 07/24/2015   5th toe and Hallux amputation  . AV FISTULA PLACEMENT Left 08/06/2018   Procedure: INSERTION OF BASCILIC VEN TRANSPOSITION 1ST STAGE;  Surgeon: Rosetta Posner, MD;  Location: [facility];  Service: Vascular;  Laterality: Left;  . BASCILIC VEIN TRANSPOSITION Left 04/29/2019   Procedure: SECOND STAGE BASCILIC VEIN TRANSPOSITION LEFT ARM;  Surgeon: Rosetta Posner, MD;  Location: [facility];  Service: Vascular;  Laterality: Left;  . CESAREAN SECTION     x 1  . EXOSTECTECTOMY TOE Left 04/11/2016   Procedure: EXOSTECTECTOMY CUBOID AND 5TH METATARSAL AND PLACEMENT OF ANTIBIOTIC BEADS;  Surgeon: Edrick Kins, DPM;  Location: [facility];  Service: Podiatry;  Laterality: Left;  . EYE SURGERY Bilateral    removed cataracts - laser  . FEMORAL-POPLITEAL BYPASS GRAFT Left 05/03/2015  . FOOT AMPUTATION THROUGH METATARSAL Right 2016  . hemrrhoidectomy    . I & D EXTREMITY Right 06/10/2014   Procedure: IRRIGATION AND DEBRIDEMENT Right Foot;  Surgeon: Newt Minion, MD;  Location: [facility];  Service: Orthopedics;  Laterality: Right;  . I & D EXTREMITY Right 07/31/2018   Procedure: IRRIGATION AND DEBRIDEMENT EXTREMITY;  Surgeon: Edrick Kins, DPM;  Location: [facility];  Service: Podiatry;  Laterality: Right;  . I & D EXTREMITY Right 08/04/2018   Procedure: IRRIGATION AND DEBRIDEMENT FOOT with placement of wound vac;  Surgeon: Edrick Kins, DPM;  Location: [facility];  Service: Podiatry;  Laterality: Right;  . INCISION AND DRAINAGE Left  04/11/2016   Procedure: INCISION AND DRAINAGE A DEEP COMPLICATED WOUND OF LEFT FOOT;  Surgeon: Edrick Kins, DPM;  Location: [facility];  Service: Podiatry;  Laterality: Left;  . INSERTION OF DIALYSIS CATHETER Right 08/06/2018   Procedure: INSERTION OF TUNNELED DIALYSIS CATHETER;  Surgeon: Rosetta Posner, MD;  Location: [facility];  Service: Vascular;  Laterality: Right;  . IR FLUORO GUIDE CV LINE RIGHT  09/24/2017  . IR FLUORO GUIDE CV LINE RIGHT  08/01/2018  . IR REMOVAL TUN CV CATH W/O FL  11/22/2017  . IR US GUIDE VASC ACCESS RIGHT  09/24/2017  . IR US GUIDE VASC ACCESS RIGHT  08/01/2018  . IRRIGATION AND DEBRIDEMENT ABSCESS Left 05/06/2017   Procedure: IRRIGATION AND DEBRIDEMENT ABSCESS LEFT FOOT;  Surgeon: Edrick Kins, DPM;  Location: [facility];  Service: Podiatry;  Laterality: Left;  . PERIPHERALLY INSERTED CENTRAL CATHETER INSERTION    . SKIN GRAFT Right 06/15/2014  . SKIN GRAFT Left 06/2015   foot   . SKIN SPLIT GRAFT Right 06/15/2014   Procedure: SPLIT THICKNESS SKIN GRAFT RIGHT FOOT;  Surgeon: Newt Minion, MD;  Location: [facility];  Service: Orthopedics;  Laterality: Right;  . TRANSMETATARSAL AMPUTATION Right   . TRANSMETATARSAL AMPUTATION Right 09/11/2014  . WISDOM TOOTH EXTRACTION      Family History  Problem Relation Age of Onset  . Hyperlipidemia Mother   . Dementia Mother     Social History:  reports that she quit smoking about 11 years ago. She quit after 15.00 years of use. She has never used smokeless tobacco. She reports previous alcohol use. She reports that she does not use drugs.  Review of Systems:   Hypertension/CKD:   Is on dialysis  Currently not on any antihypertensive  BP Readings from Last 3 Encounters:  07/21/19 140/74  06/03/19 (!) 169/87  04/29/19 137/64      Lipids: Has been  treated with pravastatin 40 mg  Followed by PCP   Lab Results  Component Value Date   CHOL 152 06/12/2018   HDL 24.40 (L) 06/12/2018   LDLCALC 78 05/02/2016   LDLDIRECT 82.0  06/12/2018   TRIG 222.0 (H) 06/12/2018   CHOLHDL 6 06/12/2018      Chronic anemia, followed by nephrologist and PCP:  Lab Results  Component Value Date   WBC 6.1 11/10/2018   HGB 11.2 (L) 04/29/2019   HCT 33.0 (L) 04/29/2019   MCV 73.9 (L) 11/10/2018   PLT 370 11/10/2018    She has a Charcot foot, 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  There is no height or weight on file to calculate BMI.      ASSESSMENT/ PLAN:   Diabetes type 1 with poor control and multiple complications      See history of present illness for detailed discussion of current diabetes management, blood sugar patterns and problems identified  A1c is 11.7  She has started the closed-loop insulin pump with the T-Slim device and is here for her 2-day follow-up  CGM and blood sugar patterns, bolus events were reviewed in detail  Blood sugars are mostly higher when  she is eating meals or snacks without any boluses She still is afraid to bolus and also does not appear to be understanding the need to bolus both for carbohydrates and any high fat foods Discussed that her blood sugars have been over 300 postprandially without any mealtime boluses Also since she is tending to have low blood sugars midmorning and midafternoon will need to make adjustments in her basal rates Difficult to adjust her evening basal rate since she does not have data from yesterday and also no bolus at dinnertime She again has difficulty programming the pump on her own   PLAN:     Carbohydrate ratio 1:20 to be programmed with the help of nurse educator  9 AM will be 0.4 instead of 0.55  Basal rate at 1 PM will be 0.3 instead of 0.4  She needs to learn how to adjust her settings herself and the nurse educator will help her again today  She will turn on the exercise mode when going in for dialysis for 4 hours  Bolus  consistently for all meals, may need additional 1-2 units for any higher fat content  Make correction boluses if blood sugars are not coming down with control IQ  Follow-up tomorrow again  There are no Patient Instructions on file for this visit.      Elayne Snare 07/22/2019, 3:38 PM      Elayne Snare 07/22/19

## 2019-07-23 ENCOUNTER — Encounter: Payer: Self-pay | Admitting: Endocrinology

## 2019-07-23 ENCOUNTER — Ambulatory Visit (INDEPENDENT_AMBULATORY_CARE_PROVIDER_SITE_OTHER): Payer: Medicare HMO | Admitting: Endocrinology

## 2019-07-23 DIAGNOSIS — E1065 Type 1 diabetes mellitus with hyperglycemia: Secondary | ICD-10-CM | POA: Diagnosis not present

## 2019-07-23 DIAGNOSIS — Z0289 Encounter for other administrative examinations: Secondary | ICD-10-CM

## 2019-07-23 NOTE — Progress Notes (Signed)
Patient ID: [name], female   DOB: May 30, 1970, 49 y.o.   MRN: [id number]  I connected with the above-named patient by video enabled telemedicine application and verified that I am speaking with the correct person. The patient was explained the limitations of evaluation and management by telemedicine and the availability of in person appointments.  Patient also understood that there may be a patient responsible charge related to this service . Location of the patient: Patient's home . Location of the provider: Physician office Only the patient and myself were participating in the encounter The patient understood the above statements and agreed to proceed.  Reason for Appointment: follow-up of diabetes  History of Present Illness   Diagnosis: Type 1 DIABETES MELITUS  Previous history: She has had long-standing poorly controlled diabetes and typically has poor compliance with self care measures despite periodic diabetes education and periodic followup  INSULIN PUMP regimen:   Current insulin pump: T-insulin as of 07/20/2019   Basal rates: Midnight to 6 AM = 0.6, 3 AM = 0.5, 6 AM-9 AM: 0.55, 9 AM-1 PM = 0.4, 1 PM-3 PM = 0.3, 3 PM- 7 PM = 0.75, 7 PM-12 AM = 0.85   Carb coverage 1: 15 g and correction 1: 70 Active insulin 4 hours CGM alerts: 80 and 200, rise and fall alerts are off         Recent history:   Her last A1c was   9.3 previously and now 11.7  Current blood sugar patterns, difficulties with management and problems identified:   She started using the T-insulin pump 3 days ago  Her basal rate was reduced down to 0.4 between 9 AM and 11 AM yesterday  She finally has started bolusing for her meals as directed  However yesterday evening around 5 PM she is only had chocolate cookies and milk and no other meals apparently.  She put in 60 g of carbohydrate for this but her blood sugar went up to about 240 postprandially  She may have had snack around 11  PM without coverage that raised her blood sugar but it was back down to about 105 at 4 AM  This morning she went to dialysis but did not turn on the exercise setting as directed and her blood sugar went down to 63 at about 10 AM  She had a protein drink with 44 g of carbohydrates along with a bolus which transiently raised her blood sugar but subsequently it went down again to 68 by about 12 noon  She came home and had a sandwich without any bolus coverage and her blood sugar was up to 261  She still does not feel comfortable making setting changes in her pump  AVERAGE blood sugar since yesterday overall 154  PREVIOUS blood sugars:   PRE-MEAL  morning  midday  evening  midnight-6 AM Overall  Glucose range:  169-590  95-398  119-524  82-600+   Mean/median:  369  330  250  AC 384, PC 358  351   POST-MEAL PC Breakfast PC Lunch  6 PM +  Glucose range:    64-600  Mean/median:    400      Meals: eating at variable times and also having snacks periodically.  Her daily carbohydrate intake is quite variable Physical activity: exercise: none         Dietician visit: Most recent:?          Complications: are: Neuropathy, nephropathy, diabetic foot ulcer, amputation  Wt Readings from Last 3 Encounters:  07/21/19 125 lb 12.8 oz (57.1 kg)  06/03/19 126 lb (57.2 kg)  04/29/19 126 lb (57.2 kg)   Lab Results  Component Value Date   HGBA1C 11.7 (A) 07/21/2019   HGBA1C 9.3 (A) 02/04/2019   HGBA1C 11.4 (A) 10/15/2018   Lab Results  Component Value Date   MICROALBUR 304.2 (H) 11/11/2017   LDLCALC 78 05/02/2016   CREATININE 3.70 (H) 04/29/2019   Office Visit on 07/21/2019  Component Date Value Ref Range Status  . Hemoglobin A1C 07/21/2019 11.7* 4.0 - 5.6 % Final    Other problems addressed:: See review of systems   Allergies as of 07/23/2019      Reactions   Cleocin [clindamycin Hcl] Diarrhea   Lisinopril Other (See Comments)   Elevated potassium per pt report   Amoxicillin  Diarrhea, Nausea Only, Other (See Comments)   Did it involve swelling of the face/tongue/throat, SOB, or low BP? No Did it involve sudden or severe rash/hives, skin peeling, or any reaction on the inside of your mouth or nose? No Did you need to seek medical attention at a hospital or doctor's office? No When did it last happen?10 + years If all above answers are "NO", may proceed with cephalosporin use.   Bactrim [sulfamethoxazole-trimethoprim] Diarrhea, Nausea Only      Medication List       Accurate as of July 23, 2019  2:17 PM. If you have any questions, ask your nurse or doctor.        albuterol 108 (90 Base) MCG/ACT inhaler Commonly known as: VENTOLIN HFA Inhale 2 puffs into the lungs every 6 (six) hours as needed for wheezing or shortness of breath.   aspirin EC 81 MG tablet Take 81 mg by mouth daily.   B-12 5000 MCG Caps Take 5,000 mcg by mouth daily.   calcitRIOL 0.5 MCG capsule Commonly known as: ROCALTROL Take 1 capsule daily What changed:   how much to take  how to take this  when to take this  additional instructions   calcium acetate 667 MG capsule Commonly known as: PHOSLO Take 2 capsules (1,334 mg total) by mouth 3 (three) times daily with meals.   Contour Next Test test strip Generic drug: glucose blood USE AS INSTRUCTED TO CHECK BLOOD SUGAR 4 TIMES A DAY. DX E10.65   Dexcom G6 Receiver Devi 1 each by Does not apply route See admin instructions. Use Dexcom Receiver to monitor blood sugar continuously.   Dexcom G6 Sensor Misc 1 each by Does not apply route See admin instructions. Use one sensors once every 10 days to monitor blood sugars.   Dexcom G6 Transmitter Misc 1 each by Does not apply route See admin instructions. Use one transmitter once every 90 days.   DIALYVITE 800 WITH ZINC 0.8 MG Tabs Take 1 tablet by mouth daily.   diphenhydrAMINE 25 MG tablet Commonly known as: BENADRYL Take 25 mg by mouth daily as needed.   folic  acid 010 MCG tablet Commonly known as: FOLVITE Take 400 mcg by mouth daily.   insulin aspart 100 UNIT/ML injection Commonly known as: NovoLOG USE A MAX OF 65 UNITS DAILY VIA INSULIN PUMP.   insulin pump Soln Inject into the skin. Insulin aspart (Novolog) 100 unit/ml---MAX 65 UNITS DAILY   norethindrone 5 MG tablet Commonly known as: AYGESTIN Take 5 mg by mouth See admin instructions.   Omega 3 500 500 MG Caps Take 500 mg by mouth daily.  ondansetron 4 MG tablet Commonly known as: ZOFRAN Take 4 mg by mouth every 8 (eight) hours as needed for nausea or vomiting.   oxyCODONE-acetaminophen 5-325 MG tablet Commonly known as: PERCOCET/ROXICET Take 1 tablet by mouth every 6 (six) hours as needed.   pravastatin 40 MG tablet Commonly known as: PRAVACHOL TAKE 1 TABLET BY MOUTH EVERY DAY   sulfamethoxazole-trimethoprim 800-160 MG tablet Commonly known as: BACTRIM DS Take 1 tablet by mouth daily.       Allergies:  Allergies  Allergen Reactions  . Cleocin [Clindamycin Hcl] Diarrhea  . Lisinopril Other (See Comments)    Elevated potassium per pt report  . Amoxicillin Diarrhea, Nausea Only and Other (See Comments)    Did it involve swelling of the face/tongue/throat, SOB, or low BP? No Did it involve sudden or severe rash/hives, skin peeling, or any reaction on the inside of your mouth or nose? No Did you need to seek medical attention at a hospital or doctor's office? No When did it last happen?10 + years If all above answers are "NO", may proceed with cephalosporin use.    . Bactrim [Sulfamethoxazole-Trimethoprim] Diarrhea and Nausea Only    Past Medical History:  Diagnosis Date  . Anemia   . Arthritis   . Bronchitis   . C. difficile diarrhea 09/26/2014  . Cataracts, both eyes    had surgery to remove  . Cellulitis of right foot 06/02/2014  . CKD (chronic kidney disease) stage 3, GFR 30-59 ml/min    Dialysis T, TH, Sat  . Diabetic ulcer of right foot ([place])  06/02/2014  . DVT (deep venous thrombosis) ([place]) 10/2014   one in each one in leg- PICC line , one in right and left arm  . H/O seasonal allergies   . Heart murmur    told once when she was pregnant- no furter mention- was in notes 11/2015- had Echo 8 /4/17  . History of blood transfusion   . Hypercholesteremia   . Hypertension   . Left foot infection   . Neuromuscular disorder ([place])    lower legs and feet  . Neuropathy    feet  . Neuropathy in diabetes ([place])   . Peripheral vascular disease ([place])   . Sleep apnea    does not use cpap  . Staphylococcus aureus bacteremia 10/2014  . Type 1 diabetes ([place])    onset age 37    Past Surgical History:  Procedure Laterality Date  . AMPUTATION TOE Left 07/24/2015   5th toe and Hallux amputation  . AV FISTULA PLACEMENT Left 08/06/2018   Procedure: INSERTION OF BASCILIC VEN TRANSPOSITION 1ST STAGE;  Surgeon: Rosetta Posner, MD;  Location: [facility] [facility];  Service: Vascular;  Laterality: Left;  . BASCILIC VEIN TRANSPOSITION Left 04/29/2019   Procedure: SECOND STAGE BASCILIC VEIN TRANSPOSITION LEFT ARM;  Surgeon: Rosetta Posner, MD;  Location: [facility];  Service: Vascular;  Laterality: Left;  . CESAREAN SECTION     x 1  . EXOSTECTECTOMY TOE Left 04/11/2016   Procedure: EXOSTECTECTOMY CUBOID AND 5TH METATARSAL AND PLACEMENT OF ANTIBIOTIC BEADS;  Surgeon: Edrick Kins, DPM;  Location: [facility];  Service: Podiatry;  Laterality: Left;  . EYE SURGERY Bilateral    removed cataracts - laser  . FEMORAL-POPLITEAL BYPASS GRAFT Left 05/03/2015  . FOOT AMPUTATION THROUGH METATARSAL Right 2016  . hemrrhoidectomy    . I & D EXTREMITY Right 06/10/2014   Procedure: IRRIGATION AND DEBRIDEMENT Right Foot;  Surgeon: Newt Minion, MD;  Location:  [facility];  Service: Orthopedics;  Laterality: Right;  . I & D EXTREMITY Right 07/31/2018   Procedure: IRRIGATION AND DEBRIDEMENT EXTREMITY;  Surgeon: Edrick Kins, DPM;  Location: [facility];  Service: Podiatry;  Laterality: Right;  . I & D  EXTREMITY Right 08/04/2018   Procedure: IRRIGATION AND DEBRIDEMENT FOOT with placement of wound vac;  Surgeon: Edrick Kins, DPM;  Location: [facility];  Service: Podiatry;  Laterality: Right;  . INCISION AND DRAINAGE Left 04/11/2016   Procedure: INCISION AND DRAINAGE A DEEP COMPLICATED WOUND OF LEFT FOOT;  Surgeon: Edrick Kins, DPM;  Location: [facility];  Service: Podiatry;  Laterality: Left;  . INSERTION OF DIALYSIS CATHETER Right 08/06/2018   Procedure: INSERTION OF TUNNELED DIALYSIS CATHETER;  Surgeon: Rosetta Posner, MD;  Location: [facility];  Service: Vascular;  Laterality: Right;  . IR FLUORO GUIDE CV LINE RIGHT  09/24/2017  . IR FLUORO GUIDE CV LINE RIGHT  08/01/2018  . IR REMOVAL TUN CV CATH W/O FL  11/22/2017  . IR US GUIDE VASC ACCESS RIGHT  09/24/2017  . IR US GUIDE VASC ACCESS RIGHT  08/01/2018  . IRRIGATION AND DEBRIDEMENT ABSCESS Left 05/06/2017   Procedure: IRRIGATION AND DEBRIDEMENT ABSCESS LEFT FOOT;  Surgeon: Edrick Kins, DPM;  Location: [facility];  Service: Podiatry;  Laterality: Left;  . PERIPHERALLY INSERTED CENTRAL CATHETER INSERTION    . SKIN GRAFT Right 06/15/2014  . SKIN GRAFT Left 06/2015   foot   . SKIN SPLIT GRAFT Right 06/15/2014   Procedure: SPLIT THICKNESS SKIN GRAFT RIGHT FOOT;  Surgeon: Newt Minion, MD;  Location: [facility];  Service: Orthopedics;  Laterality: Right;  . TRANSMETATARSAL AMPUTATION Right   . TRANSMETATARSAL AMPUTATION Right 09/11/2014  . WISDOM TOOTH EXTRACTION      Family History  Problem Relation Age of Onset  . Hyperlipidemia Mother   . Dementia Mother     Social History:  reports that she quit smoking about 11 years ago. She quit after 15.00 years of use. She has never used smokeless tobacco. She reports previous alcohol use. She reports that she does not use drugs.  Review of Systems:   Hypertension/CKD:   Is on dialysis  Currently not on any antihypertensive  BP Readings from Last 3 Encounters:  07/21/19 140/74  06/03/19 (!) 169/87  04/29/19  137/64      Lipids: Has been  treated with pravastatin 40 mg  Followed by PCP   Lab Results  Component Value Date   CHOL 152 06/12/2018   HDL 24.40 (L) 06/12/2018   LDLCALC 78 05/02/2016   LDLDIRECT 82.0 06/12/2018   TRIG 222.0 (H) 06/12/2018   CHOLHDL 6 06/12/2018      Chronic anemia, followed by nephrologist and PCP:  Lab Results  Component Value Date   WBC 6.1 11/10/2018   HGB 11.2 (L) 04/29/2019   HCT 33.0 (L) 04/29/2019   MCV 73.9 (L) 11/10/2018   PLT 370 11/10/2018    She has a Charcot foot, 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  There is no height or weight on file to calculate BMI.      ASSESSMENT/ PLAN:   Diabetes type 1 with poor control and multiple complications      See history of present illness for detailed discussion of current diabetes management, blood sugar patterns and problems identified  A1c is 11.7  She has started the closed-loop insulin pump with the T-Slim device and is here for her 2-day follow-up  CGM and blood sugar patterns, bolus events were reviewed in detail  Blood sugars are somewhat better controlled but she still appears to be getting tendency to low sugars with dialysis when she does not change to the exercise mode However since her blood sugar was continuing to be low till about 12 noon may benefit from a lower basal rate before noon also Mealtime boluses when they are done appear to be variably effective depending on her diet  Over the last couple of days her blood sugar average at any different segment of the day ranged between 141 and 170  She again has difficulty programming the pump on her own   PLAN:     Carbohydrate ratio 1:20 to be continued  Basal rate at 9 AM will be 0.3, she will call the helpline if she cannot program this herself  She needs to bolus consistently for all snacks and meals and avoid  high carbohydrate meals without protein as well as sweets  She will remember to set up the activity and exercise setting either when she leaves home for dialysis or when she reaches there at 7 AM  Make correction boluses if blood sugars are not coming down with control IQ  She will call if she has any difficulties with her control and follow-up with diabetes educator on Monday  Follow-up in 2 weeks  There are no Patient Instructions on file for this visit.      Elayne Snare 07/23/2019, 2:17 PM      Elayne Snare 07/23/19

## 2019-07-24 ENCOUNTER — Telehealth: Payer: Self-pay | Admitting: Endocrinology

## 2019-07-24 NOTE — Telephone Encounter (Signed)
Patient states last night her sugar went down to about 50 and then drank some juice and it went even lower and this was about 2am - patient please requests nurse to give her a call back. [phone]

## 2019-07-24 NOTE — Telephone Encounter (Signed)
Please reduce the basal 9AM-1 PM to 0.2 units/HR

## 2019-07-24 NOTE — Telephone Encounter (Signed)
Spoke with pt and let her know of Dr. Cordelia Pen instructions. She expressed understanding.

## 2019-07-24 NOTE — Telephone Encounter (Signed)
Went home after dialysis and went to sleep. Woke up and sugar was low at 50. She drank some juice, and then went back to sleep. She woke up approx 2 hours later and blood sugar was again low.  Dr. Loanne Drilling, could you please advise in Dr. Ronnie Derby absence?

## 2019-07-27 ENCOUNTER — Other Ambulatory Visit: Payer: Self-pay

## 2019-07-27 ENCOUNTER — Encounter (HOSPITAL_BASED_OUTPATIENT_CLINIC_OR_DEPARTMENT_OTHER): Payer: Medicare HMO | Admitting: Internal Medicine

## 2019-07-27 ENCOUNTER — Telehealth: Payer: Self-pay | Admitting: Nutrition

## 2019-07-27 DIAGNOSIS — E10621 Type 1 diabetes mellitus with foot ulcer: Secondary | ICD-10-CM | POA: Diagnosis not present

## 2019-07-27 NOTE — Telephone Encounter (Signed)
Message left that she was having pump problems,and to call her back.   Patient reports that her blood sugar was in the 200s all day yesterday, and she was doing correction doses of 0.2u as needed q 4 hours.  Blood sugar dropped to 32 at between 2 and 4 AM this morning.  She required assistance from a family member to correct this.  She took her pump off at 4AM, and her  Blood sugar is now 85.  Please advise.

## 2019-07-27 NOTE — Progress Notes (Signed)
Nancy, [name] ([id number]) Visit Report for 07/27/2019 Debridement Details Patient Name: Date of Service: [name], [name] 07/27/2019 12:45 PM Medical Record Number:[id number] Patient Account Number: [id number] Date of Birth/Sex: Treating RN: May 01, 1970 (49 y.o. Nancy Fetter Primary Care Provider: [name] Other Clinician: Referring Provider: Treating Provider/Extender:Hanah Moultry, [name], [name] in Treatment: 46 Debridement Performed for Wound #43 Right,Medial Foot Assessment: Performed By: Physician Ricard Dillon., MD Debridement Type: Debridement Severity of Tissue Pre Fat layer exposed Debridement: Level of Consciousness (Pre- Awake and Alert procedure): Pre-procedure Verification/Time Out Taken: Yes - 13:03 Start Time: 13:03 Pain Control: Other : Benzocaine 20% Total Area Debrided (L x W): 3.5 (cm) x 1.5 (cm) = 5.25 (cm) Tissue and other material Viable, Non-Viable, Slough, Subcutaneous, Slough debrided: Level: Skin/Subcutaneous Tissue Debridement Description: Excisional Instrument: Curette Bleeding: Minimum Hemostasis Achieved: Pressure End Time: 13:04 Procedural Pain: 0 Post Procedural Pain: 0 Response to Treatment: Procedure was tolerated well Level of Consciousness Awake and Alert (Post-procedure): Post Debridement Measurements of Total Wound Length: (cm) 3.5 Width: (cm) 1.5 Depth: (cm) 0.1 Volume: (cm) 0.412 Character of Wound/Ulcer Post Improved Debridement: Severity of Tissue Post Debridement: Fat layer exposed Post Procedure Diagnosis Same as Pre-procedure Electronic Signature(s) Signed: 07/27/2019 5:33:33 PM By: [name] RN, BSN Signed: 07/27/2019 5:40:04 PM By: Linton Ham MD Entered By: Linton Ham on 07/27/2019 13:27:52 -------------------------------------------------------------------------------- HPI Details Patient Name: Date of Service: Leda [name] 07/27/2019 12:45 PM Medical Record  Number:[id number] Patient Account Number: [id number] Date of Birth/Sex: Treating RN: [birth year] (49 y.o. Nancy Fetter Primary Care Provider: [name] Other Clinician: Referring Provider: Treating Provider/Extender:Tyaira Heward, [name], [name] in Treatment: 18 History of Present Illness HPI Description: 49 year old diabetic who is known to have type 1 diabetes which is poorly controlled last hemoglobin A1c was 11%. She comes in with a ulcerated area on the left lateral foot which has been there for over 6 months. Was recently she has been treated by [name] of podiatry who saw her last on 05/28/2016. Review of his notes revealed that the patient had incision and drainage with placement of antibiotic beads to the left foot on 04/11/2016 for possible osteomyelitis of the cuboid bone. Over the last year she's had a history of amputation of the left fifth toe and a femoropopliteal popliteal bypass graft somewhere in April 2017. 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 His note [name] mentions that the patient has been referred to me for further wound care and possibly great candidate for hyperbaric oxygen therapy due to recurrent osteomyelitis. However we do not have any x-rays of biopsy reports confirming this. He has been on several antibiotics including Bactrim and most recently is on doxycycline for an MRSA. I understand, the patient was not a candidate for IV antibiotics as she has had previous PICC lines which resulted in blood clots in both arms. There was a x-ray report dated 04/04/2016 on [name] notes which showed evidence of fifth ray resection left foot with osteolytic changes noted to the fourth metatarsal and cuboid bone on the left. 06/13/2016 -- had a left foot x-ray which showed no acute fracture or dislocation and no definite radiographic evidence of osteomyelitis. Advanced osteopenia was seen. 06/20/2016 -- she has noticed a new wound on  the right plantar foot in the region where she had a callus before. 06/27/16- the patient did have her x-ray of the right foot which showed no findings to suggest osteomyelitis. She saw her endocrinologist, [name], yesterday. Her  A1c in January was 11. He also indicates mismanagement and noncompliance regarding her diabetes. She is currently on Bactrim for a lip infection. She is complaining of nausea, vomiting and diarrhea. She is unable to articulate the exact orders or dosing of the Bactrim; it is unclear when she will complete this. 07/04/2016 -- results from [facility] of ABIs with ankle waveforms were noted from 02/14/2016. The examination done on 06/27/2015 showed noncompressible ABIs with the right being 1.45 and the left being 1.33. The present examination showed a right ABI of 1.19 on the left of 1.33. The conclusion was that right normal ABI in the lower extremity at rest however compared to previous study which was noncompressible ABI may be falsely elevated side suggesting medial calcification. The left ABI suggested medial calcification. 08/01/2016 -- the patient had more redness and pain on her right foot and did not get to come to see as noted she see her PCP or go to the ER and decided to take some leftover metronidazole which she had at home. As usual, the patient does report she feels and is rather noncompliant. 08/08/2016 -- -- x-ray of the right foot -- FINDINGS:Transmetatarsal amputation is noted. No bony destruction is noted to suggest osteomyelitis. IMPRESSION: No evidence of osteomyelitis. Postsurgical changes are seen. MRI would be more sensitive for possible bony changes. Culture has grown Serratia Marcescens -- sensitive to Bactrim, ciprofloxacin, ceftazidime she was seen by [name] on 08/06/2016. He did not find any exposed bone, muscle, tendon, ligament or joint. There was no malodor and he did a excisional debridement in the office. ============ Old  notes: 49 year old patient who is known to the wound clinic for a while had been away from the wound clinic since 09/01/2014. Over the last several months she has been admitted to various hospitals including [place] at [facility]. She was treated for a right metatarsal osteomyelitis with a transmetatarsal amputation and this was done about 2 months ago. He has a small ulcerated area on the right heel and she continues to have an ulcerated area on the left plantar aspect of the foot. The patient was recently admitted to the [facility] hospital group between 7/12 and 10/18/2014. she was given 3 weeks of IV vancomycin and was to follow-up with her surgeons at [facility] and also took oral vancomycin for C. difficile colitis. 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 MRI of the left foot done on 10/14/2014 showed no abscess or osteomyelitis. 04/27/15; this is a patient we know from previous stays in the wound care center. She is a type I diabetic I am not sure of her control currently. Since the last time I saw her she is had a right transmetatarsal amputation and has no wounds on her right foot and has no open wounds. She is been followed at the wound care center at [facility] in [place]. She comes today with the desire to undergo hyperbaric treatment locally. Apparently one of her wound care providers in [place] has suggested hyperbarics. This is in response to an MRI from 04/18/15 that showed increased marrow signal and loss of the proximal fifth metatarsal cortex evidence of osteomyelitis with likely early osteomyelitis in the cuboid bone as well. She has a large wound over the base of the fifth  metatarsal.  She also has a eschar over her the tips of her toes on 1,3 and 5. She does not have peripheral pulses and apparently is going for an angiogram tomorrow which seems reasonable. After this she is going to infectious disease at [facility]. They have been using Medihoney to the large wound on the lateral aspect of the left foot to. The patient has known Charcot deformity from diabetic neuropathy. She also has known diabetic PAD. Surprisingly I can't see that she has had any recent antibiotics, the patient states the last antibiotic she had was at the end of November for 10 days. I think this was in response to culture that showed group G strep although I'm not exactly sure where the culture was from. She is also had arterial studies on 03/29/15. This showed a right ABI of 1.4 that was noncompressible. Her left ABI was 0.73. 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 It was not felt that arterial inflow was adequate for healing of a foot ulcer. Her Doppler waveforms looked monophasic ===== READMISSION 02/28/17; this is in an now 49 year old woman we've had at several different occasions in this clinic. 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 She has a remote ex-smoker. She was last seen in this clinic by Dr. Con Memos I think in May. More recently she is been followed by her podiatrist [name] an infectious disease Dr. Megan Salon. She has 2 open wounds the major one is over the right first metatarsal head she also has a wound on the left plantar foot. an MRI of the right foot on 01/01/17 showed a soft tissue ulcer along the plantar aspect of the first metatarsal base consistent with osteomyelitis of the first metatarsal stump. Dr. 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 According to the patient this is been open for slightly over a month. She has been on a  combination of Cipro 500 twice a day, Zyvox 600 twice a day and Flagyl 500 3 times a day for over a month now as directed by Dr. Megan Salon. cultures of the right foot earlier this year showed MRSA in January and Serratia in May. January also had a few viridans strep. Recent x-rays of both feet were done and [name] office and I don't have these reports. The patient has known PAD and has a history of aleft femoropopliteal bypass in April 2017. 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 Dwyane Dee. In spite of this the last hemoglobin A1c I can see is 10.1 on 01/01/2017. She is being referred by 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 She is been using Medihoney to this area and also an area on the left midfoot. She is using healing sandals bilaterally. ABIs in this clinic at the left posterior tibial was 1.1 noncompressible on the right READMISSION Non invasive vascular NOVANT 5/18 Aftercare following surgery of the circulatory system Procedure Note - Interface, External Ris In - 08/13/2016 11:05 AM EDT Procedure: Examination consists of physiologic resting arterial pressures of the brachial and ankle arteries bilaterally with continuous wave Doppler waveform analysis. Previous: Previous exam performed on 02/14/16 demonstrated ABIs of Rt = 1.19 and Lt =  1.33. Right: ABI = non-compressible PT, 1.47 DP. S/P transmet amputation. Left: ABI = 1.52, 2nd digit pressure = 87 mmHg Conclusions: Right: ABI (>1.3) may be falsely elevated, suggesting medial calcification. Left: ABI (>1.3) may be falsely elevated, suggesting medial calcification The patient is a now 50 year old type I diabetic is had multiple issues her graded to chronic diabetic foot ulcers.  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 We had her in the clinic lastin November. At that point she had wounds on her bilateral feet.she had wanted to try hyperbarics however the healogics review process denied her because she hadn't followed up with her vascular surgeon for her left femoropopliteal bypass. The bypass was done by Dr. Raul Del at [facility]. We made her a follow-up with Dr. Raul Del however she did not keep the appointment and therefore she was not approved The patient shows me a small wound on her left fourth metatarsal head on her phone. She developed rapid discoloration in the plantar aspect of the left foot and she was admitted to hospital from 2/2 through 05/10/17 with wet gangrene of the left foot osteomyelitis of the fourth metatarsal heads. She was admitted acutely ill with a temperature of 103. She was started on broad-spectrum vancomycin and cefepime. On 05/06/17 she was taken to the OR by [name] her podiatric surgeon for an incision and drainage irrigation of the left foot wound. Cultures from this surgery revealed group be strep and anaerobes. she was seen by Dr.Xu of orthopedic surgery and scheduled for a below-knee amputation which she u refused. Ultimately she was discharged on Levaquin and Flagyl for one month. MRI 05/05/17 done while she was in the hospital showed abscess adjacent to the fourth metatarsal head and neck small abscess around the fourth flexor tendon. Inflammatory phlegmon and gas in the soft tissues along the lateral aspect of the fourth phalanx. Findings worrisome for osteomyelitis involving the fourth proximal and middle phalanx and also the third and fourth metatarsals. Finally the patient had actually shortly before this followed up with Dr. Raul Del at no time on 04/29/17. He felt that her left femoropopliteal bypass was patent he felt that her left-sided toe  pressures more than adequate for healing a wound on the left foot. This was before her acute presentation. 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 05/28/17; she is started hyperbarics. The patient tells me that for some reason she was not actually on Levaquin but I think on ciprofloxacin. She was on Flagyl. She only started her Levaquin yesterday due to some difficulty with the pharmacy and perhaps her sister picking it up. She has an appointment with [name] tomorrow and with infectious disease early next week. She has no new complaints 06/06/17; the patient continues in hyperbarics. She saw [name] on 05/29/17 who is her podiatric surgeon. He is elected for a transmetatarsal amputation on 06/27/17. I'm not sure at what level he plans to do this amputation. The patient is unaware She also saw Dr. Megan Salon of infectious disease who elected to continue her on current antibiotics I think this is ciprofloxacin and Flagyl. I'll need to clarify with her tomorrow if she actually has this. We're using silver alginate to the actual wound. Necrotic surface today with material under the flap of her foot. Original MRI showed abscesses as well as osteomyelitis of the proximal and middle fourth phalanx and the third and fourth metatarsal heads 06/11/17; patient continues in hyperbarics and continues on oral  antibiotics. She is doing well. The wound looks better. The necrotic part of this under the flap in her superior foot also looks better. she is been to see [name]. I haven't had a chance to look at his note. Apparently he has put the transmetatarsal amputation on hold her request it is still planning to take her to the OR for debridement and product application ACEL. I'll see if I can find his note. I'll therefore leave product ordering/requests to [name] for now. I was going to look at Dermagraft 06/18/17-she is here in follow-up evaluation for bilateral foot wounds. She continues with hyperbaric  therapy. She states she has been applying manuka honey to the right plantar foot and alternate manuka honey and silver alginate to the left foot, despite our orders. We will continue with same treatment plan and she will follo up next week. 06/25/17; I have reviewed [name] last note from 3/11. She has operative debridement in 2 days' time. By review his note apparently they're going to place there is skin over the majority of this wound which is a good choice. She has a small satellite area at the most proximal part of this wound on the left plantar foot. The area on the right plantar foot we've been using silver alginate and it is close to healing. 07/02/17; unfortunately the patient was not easily approved for [name] proposed surgery. I'm not completely certain what the issue is. She has been using silver alginate to the wound she has completed a first course of hyperbarics. She is still on Levaquin and Flagyl. I have really lost track of the time course here.I suspect she should have another week to 2 of antibiotics. I'll need to see if she is followed up with infectious disease Dr. Megan Salon 07/09/17; the patient is followed up with Dr. Megan Salon. 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 She continues on Levaquin and metronidazole continuing both of these for now I think she is been on fr about 6 weeks. She still has some drainage but no pain. No fever. Her had been plans for her to go to the OR for operative debridement with her podiatrist [name], I am not exactly sure where that is. I'll probably slip a note to [name] today. I note that she follows with Dr. Dwyane Dee of endocrinology. We have her recertified for hyperbaric oxygen. I have not heard about Dermagraft however I'll see if [name] is planning a skin substitute as well 07/16/17; the patient tells me she is just about out of [place]. I'll need to check Dr. Hale Bogus last notes on  this. She states she has plenty of Flagyl however. She comes in today complaining of pain in the right lateral foot which she said lasted for about a day. The wound on the right foot is actually much more medially. She also tells me that the [employer] cost a lot of pain in the left foot wound and she turned back to silver alginate. Finally Dermagraft has a $656 per application co-pay. She cannot afford this 07/23/17; patient arrives today with the wound not much smaller. There is not much new to add. She has not heard from [name] all try to put in a call to them today. She was asking about Dermagraft again and she has an over $812 per application co-pay she states that she would be willing to try to do a payment plan. I been  tried to avoid this. We've been using silver alginate, I'll change to [facility] 07/30/17-She is here in follow-up evaluation for left foot ulcer. She continues hyperbaric medicine. The left foot ulcer is stable we will continue with same treatment plan 08/06/17; she is here for evaluation of her left foot ulcer. Currently being treated for hyperbarics or underlying osteomyelitis. She is completed antibiotics. The left foot ulcer is better smaller with healthier looking granulation. For various reasons I am not really clear on we never got her back to the OR with [name]. He did not respond to my secure text message. Nevertheless I think that surgery on this point is not necessary nor am I completely clear that a skin substitute is necessary The patient is complaining about pain on the outside of her right foot. 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 There is no erythema. She also states the foot is warm versus her other part of her upper leg and this is largely true. It is not totally clear to me what's causing this. She thinks it's different from her usual neuropathy pain 08/13/17; she arrives in clinic today with a small wound which is superficial  on her right first metatarsal head. 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 She tells [place] she was up on her feet over the Mother's Day celebration. The large wound is on the left foot. Continues with hyperbarics for underlying osteomyelitis. We're using Hydrofera Blue. She asked me today about where we were with Dermagraft. I had actually excluded this because of the co-pay however she wants to assume this therefore I'll recheck the co-pay an order for next week. 08/20/17; the patient agreed to accept the co-pay of the first Apligraf which we applied today. She is disappointed she is finishing hyperbarics will run this through the insurance on the extent of the foot infection and the extent of the wound that she had however she is already had 60 dive's. Dermagraft No. 1 08/27/17; Dermagraft No. 2. She is not eligible for any more hyperbaric treatments this month. She reports a fair amount of drainage and she actually changed to the external dressings without disturbing the direct contact layer 09/03/17; the patient arrived in clinic today with the wound superficially looking quite healthy. Nice vibrant red tissue with some advancing epithelialization although not as much adherence of the flap as I might like. However she noted on her own fourth toe some bogginess and she brought that to our attention. Indeed this was boggy feeling like a possibility of subcutaneous fluid. 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 She is not been unwell. We've been using Dermagraft 09/10/17; the culture that I did not last week was MRSA. She saw Dr. Megan Salon this morning who is going to start her on vancomycin. I had sent him a secure a text message yesterday. I also spoke with her podiatric surgeon [name] about surgery on this foot the options for conserving a functional foot etc. Promised me he would see her and will make  back consultation today. Paradoxically her actual wound on the plantar aspect of her left foot looks really quite good. I had given her 5 days worth of Baxdella to cover her for MRSA. Her MRI came back showing osteomyelitis within the third metatarsal shaft and head and base of the third and fourth proximal phalanx. She had extensive inflammatory changes throughout the soft  tissue of the lateral forefoot. With an ill-defined fluid around the fourth metatarsal extending into the plantar and dorsal soft tissues 09/19/17; the patient is actually on oral Septra and Flagyl. She apparently refused IV vancomycin. She also saw [name] at my request who is planning her for a left BKA sometime in mid July. MRI showed osteomyelitis within the third metatarsal shaft and head and the basis of the third and fourth proximal phalanx. I believe there was felt to be possible septic arthritis involving the third MTP. 09/26/17; the patient went back to Dr. Megan Salon at my suggestion and is now receiving IV daptomycin. Her wound continues to look quite good making the decision to proceed with a transmetatarsal amputation although more difficult for the patient. I believe in my extensive discussions with her she has a good sense of the pros and cons of this. I don't NV the tuft decision she has to make. She has an appointment with [name] I believe in mid July and I previously spoken to him about this issue Has we had used 3 previous Dermagraft. Given the condition of the wound surface I went ahead and added the fourth one today area and I did this not fully realizing that she'll be traveling to [place] next week. I'm hopeful she can come back in 2 weeks 10/21/17; Her same Dermagraft on for about 3-1/2 weeks. In spite of this the wound arrives looking quite healthy. There is been a lot of healing dimensions are smaller. Looking at the square shaped wound she has now there is some undermining and some depth medially  under the undermining although I cannot palpate any bone. No surrounding infection is obvious. She has difficult questions about how to look at this going forward vis--vis amputations versus continued medical therapy. To be truthful the wound is looks so healthy and it is continued to contract. Hard to justify foot surgery at this point although I still told her that I think it might come to that if we are not able to eradicate the underlying MRSA. She is still highly at risk and she understands this 11/06/17 on evaluation today patient appears to be doing better in regard to her foot ulcer. She's been tolerating the dressing changes without complication. Currently she is here for her Dermagraft #6. Her wound continues to make excellent progress at this point. She does not appear to have any evidence of infection which is good news. 11/13/17 on evaluation today patient appears to be doing excellent at this time. She is here for repeat Dermagraft application. This is #7. Overall her wound seems to be making great progress. 12/05/17; the patient arrives with the wound in much better condition than when I last saw this almost 6 weeks ago. She still has a small probing area in the left metatarsal head region on the lateral aspect of her foot. We applied her last Dermagraft today. Since the last time she is here she has what appears to a been a blood blister on the plantar aspect of left foot although I don't see this is threatening. There is also a thick raised tissue on the right mid metatarsal head region. This was not there I don't think the last time she was here 3 weeks ago. 12/12/17; the patient continues to have a small programming area in the left metatarsal head region on the lateral aspect of her foot which was the initial large surgical wound. I applied her last Apligraf last week. I'm going to use  Endoform starting today Unfortunately she has an excoriated area in the left mid foot and the  right mid foot. The left midfoot looks like a blistered area this was not opened last week it certainly is open today. Using silver alginate on these areas. She promises me she is offloading this. 12/19/17; the small probing area in the left metatarsal head eyes think is shallower. In general her original wound looks better. We've been using Endoform. The area inferiorly that I think was trauma last week still requires debridement a lot of nonviable surface which I removed. She still has an open open area distally in her foot Similarly on the right foot there is tightly adherent surface debris which I removed. Still areas that don't look completely epithelialized. This is a small open area. We used silver alginate on these areas 12/26/2017; the patient did not have the supplies we ordered from last week including the Endoform. The original large wound on the left lateral foot looks healthy. She still has the undermining area that is largely unchanged from last week. She has the same heavily callused raised edged wounds on the right mid and left midfoot. Both of these requiring debridement. We have been using silver alginate on these areas 01/02/2018; there is still supply issues. We are going to try to use Prisma but I am not sure she actually got it from what she is saying. She has a new open area on the lateral aspect of the left fourth toe [previous fifth ray amputation]. Still the one tunneling area over the fourth metatarsal head. The area is in the midfoot bilaterally still have thick callus around them. She is concerned about a raised swelling on the lateral aspect of the foot. However she is completely insensate 01/10/2018; we are using Prisma to the wounds on her bilateral feet. Surprisingly the tunneling area over the left fourth metatarsal head that was part of her original surgery has closed down. She has a small open area remaining on the incision line. 2 open areas in the  midfoot. 02/10/2018; the patient arrives back in clinic after a month hiatus. She was traveling to visit family in [place]. Is fairly clear she was not offloading the areas on her feet. The original wound over the left lateral foot at the level of metatarsal heads is reopened and probes medially by about a centimeter or 2. She notes that a week ago she had purulent drainage come out of an area on the left midfoot. Paradoxically the worst area is actually on the right foot is extensive with purulent drainage. We will use silver alginate today 02/17/2018; the patient has 3 wounds one over the left lateral foot. She still has a small area over the metatarsal heads which is the remnant of her original surgical wound. This has medial probing depth of roughly 1.4 cm somewhat better than last week. The area on the right foot is larger. We have been using silver alginate to all areas. The area on the right foot and left foot that we cultured last week showed both Klebsiella and Proteus. Both of these are quinolone sensitive. The patient put her's self on Bactrim and Flagyl that she had left hanging around from prior antibiotic usages. She was apparently on this last week when she arrived. I did not realize this. Unfortunately the Bactrim will not cover either 1 of these organisms. We will send in Cipro 500 twice daily for a week 03/04/2018; the patient has 2 wounds on the left  foot one is the original wound which was a surgical wound for a deep DFU. At one point this had exposed bone. She still has an area over the fourth metatarsal head that probes about 1.4 cm although I think this is better than last week. I been using silver nitrate to try and promote tissue adherence and been using silver alginate here. She also has an area in the left midfoot. This has some depth but a small linear wound. Still requiring debridement. On the right midfoot is a circular wound. A lot of thick callus around this  area. We have been using silver alginate to all wound areas She is completed the ciprofloxacin I gave her 2 weeks ago. 03/11/2018; the patient continues to have 2 open areas on the left foot 1 of which was the original surgical wound for a deep DFU. Only a small probing area remains although this is not much different from last week we have been using silver alginate. The other area is on the midfoot this is smaller linear but still with some depth. We have been using silver alginate here as well On the right foot she has a small circular wound in the mid aspect. This is not much smaller than last time. We have been using silver alginate here as well 03/18/2018; she has 3 wounds on the left foot the original surgical wound, a very superficial wound in the mid aspect and then finally the area in the mid plantar foot. She arrives in today with a very concerning area in the wound in the mid plantar foot which is her most proximal wound. There is undermining here of roughly 1-1/2 cm superiorly. Serosanguineous drainage. She tells me she had some pain on for over the weekend that shot up her foot into her thigh and she tells me that she had a nodule in the groin area. She has the single wound in the right foot. We are using endoform to both wound areas 03/24/2018; the patient arrives with the original surgical wound in the area on the left midfoot about the same as last week. There is a collection of fluid under the surface of the skin extending from the surgical wound towards the midfoot although it does not reach the midfoot wound. The area on the right foot is about the same. Cultures from last week of the left midfoot wound showed abundant Klebsiella abundant Enterococcus faecalis and moderate methicillin resistant staph I gave her Levaquin but this would have only covered the Klebsiella. She will need linezolid 04/01/2018; she is taking linezolid but for the first few days only took 1 a day. I  have advised her to finish this at twice daily dosing. In any case all of her wounds are a lot better especially on the left foot. The original surgical wound is closed. The area on the left midfoot considerably smaller. The area on the right foot also smaller. 04/08/2018; her original surgical wound/osteomyelitis on the left foot remains closed. She has area on the left foot that is in the midfoot area but she had some streaking towards this. This is not connected with her original wound at least not visually. Small wound on the right midfoot appears somewhat smaller. 04/15/18; both wounds looks better. Original wound is better left midfoot. Using silver alginate 1/21; patient states she uses saltwater soak in, stones or remove callus from around her wounds. She is also concerned about a blood blister she had on the left foot but it simply  resolved on its own. We've been using silver alginate 1/28; the patient arrives today with the same streaking area from her metatarsals laterally [the site of her original surgical wound] down to the middle of her foot. There is some drainage in the subcutaneous area here. This concerns me that there is actually continued ongoing infection in the metatarsals probably the fourth and third. This fixates an MRI of the foot without contrast [chronic renal failure] The wound in the mid part of the foot is small but I wonder whether this area actually connects with the more distal foot. The area on the right midfoot is probably about the same. Callus thick skin around the small wound which I removed with a curette we have been using silver alginate on both wound areas 2/4; culture I did of the draining site on the left foot last time grew methicillin sensitive staph aureus. MRI of the left foot showed interval resolution of the findings surrounding the third metatarsal joint on the prior study consistent with treated osteomyelitis. Chronic soft tissue ulceration in  the plantar and lateral aspect of the forefoot without residual focal fluid collection. No evidence of recurrent osteomyelitis. 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 She tells me she had nausea and vomiting over the weekend and missed 2 days. In general her foot looks much the same. She has a small open area just below the left fourth metatarsal head. A linear area in the left midfoot. Some discoloration extending from the inferior part of this into the left lateral foot although this appears to be superficial. She has a small area on the right midfoot which generally looks smaller after debridement 2/18; the patient is completing his cephalexin and has another 2 days. She continues to have open areas on the left and right foot. 2/25; she is now off antibiotics. The area on the left foot at the site of her original surgical wound has closed yet again. She still has open areas in the mid part of her foot however these appear smaller. The area on the right mid foot looks about the same. We have been using silver alginate She tells me she had a serious hypoglycemic spell at home. She had to have EMS called and get IV dextrose 3/3; disappointing on the left lateral foot large area of necrotic tissue surrounding the linear area. This appears to track up towards the same original surgical wound. Required extensive debridement. The area on the right plantar foot is not a lot better also using silver 3/12; the culture I did last time showed abundant enterococcus. I have prescribed Augmentin, should cover any unrecognized anaerobes as well. In addition there were a few MRSA and Serratia that would not be well covered although I did not want to give her  multiple antibiotics. She comes in today with a new wound in the right midfoot this is not connected with the original wound over her MTP a lot of thick callus tissue around both wounds but once again she said she is not walking on these areas 3/17-Patient comes in for follow-up on the bilateral plantar wounds, the right midfoot and the left plantar wound. Both these are heavily callused surrounding the wounds. We are continuing to use silver alginate, she  is compliant with offloading and states she uses a wheelchair fairly often at home 3/24; both wound areas have thick callus. However things actually look quite a bit better here for the majority of her left foot and the right foot. 3/31; patient continues to have thick callused somewhat irritated looking tissue around the wounds which individually are fairly superficial. There is no evidence of surrounding infection. We have been using silver alginate however I change that to [facility] today 4/17; patient returns to clinic after having a scare with Covid she tested negative in her primary doctor's office. She has been using Hydrofera Blue. She does not have an open area on the right foot. On the left foot she has a small open area with the mid area not completely viable. She showed me pictures of what looks like a hemorrhagic blister from several days ago but that seems to have healed over this was on the lateral left foot 4/21; patient comes in to clinic with both her wounds on her feet closed. However over the weekend she started having pain in her right foot and leg up into the thigh. She felt as though she was running a low-grade fever but did not take her temperature. She took a doxycycline that she had leftover and yesterday a single Septra and metronidazole. She thinks things feel somewhat better. 4/28; duplex ultrasound I ordered last week was negative for DVT or superficial thrombophlebitis. She is completed the doxycycline I gave  her. States she is still having a lot of pain in the right calf and right ankle which is no better than last week. She cannot sleep. She also states she has a temperature of up to 101, coughing and complaining of visual loss in her bilateral eyes. Apparently she was tested for Covid 2 weeks ago at [facility] and that was negative. Readmission: 09/03/18 patient presents back for reevaluation after having been evaluated at the end of April regarding erythema and swelling of her right lower extremity. Subsequently she ended up going to the hospital on 07/29/18 and was admitted not to be discharged until 08/08/18. Unfortunately it was noted during the time that she was in the hospital that she did have methicillin-resistant Staphylococcus aureus as the infection noted at the site. It was also determined that she did have osteomyelitis which appears to be fairly significant. She was treated with vancomycin and in fact is still on IV vancomycin at dialysis currently. This is actually slated to continue until 09/12/18 at least which will be the completion of the six weeks of therapy. Nonetheless based on what I'm seeing at this point I'm not sure she will be anywhere near ready to discontinue antibiotics at that time. Since she was released from the hospital she was seen by [name] who is her podiatrist on 08/27/18. His note specifically states that he is recommended that the patient needs of one knee amputation on the right as she has a life-threatening situation that can lead quickly to sepsis. The patient advised she would like to try to save her leg to which [name] apparently told her that this was against all medical advice. She also want to discontinue the Wound VAC which had been initiated due to the fact that she wasn't pleased with how the wound was looking and subsequently she wanted to pursue applying Medihoney at that time. He stated that he did not believe that the right lower extremity was  salvageable and that the patient understood but would still  like to attempt hyperbaric option therapy if it could be of any benefit. She was therefore referred back to [place] for further evaluation. He plans to see her back next week. Upon inspection today patient has a significant amount purulent drainage noted from the wound at this point. The bone in the distal portion of her foot also appears to be extremely necrotic and spongy. When I push down on the bone it bubbles and seeps purulent drainage from deeper in the end of the foot. I do not think that this is likely going to heal very well at all and less aggressive surgical debridement were undertaken more than what I believe we can likely do here in our office. 09/12/2018; I have not seen this patient since the most recent hospitalization although she was in our clinic last week. I have reviewed some of her records from a complex hospitalization. 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 There is situation was complicated by MRSA bacteremia and acute on chronic renal failure now on dialysis. She is receiving vancomycin at dialysis. We started her on Dakin's wet-to-dry last week she is changing this daily. There is still purulent drainage coming out of her foot. Although she is apparently "agreeable" to a below-knee amputation which is been suggested by multiple clinicians she wants this to be done in [place]. She apparently has a telehealth visit with that provider sometime in late [place] 6/24. I have told her I think this is probably too long. Nevertheless I could not convince her to allow a local doctor to perform BKA. 09/19/2018; the patient has a large necrotic area on the right anterior foot. She has had previous transmetatarsal amputations. Culture I did last week showed MRSA nothing else she is on vancomycin at dialysis. She has continued leaking purulent drainage out of the distal part of  the large circular wound on the right anterior foot. She apparently went to see Dr. 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 Somehow that translated into her being referred to plastic surgery for debridement of the area. I gather she basically refused amputation although I do not have a copy of Dr. Berenice Primas notes. The patient really wants to have a trial of hyperbaric oxygen. I agreed with initial assessment in this clinic that this was probably too far along to benefit however if she is going to have plastic surgery I think she would benefit from ancillary hyperbaric oxygen. The issue here is that the patient has benefited as maximally as any patient I have ever seen from hyperbaric oxygen therapy. Most recently she had exposed bone on the lateral part of her left foot after a surgical procedure and that actually has closed. She has eschared areas in both heels but no open area. She is remained systemically well. I am not optimistic that anything can be done about this but the patient is very clear that she wants an attempt. The attempt would include a wound VAC further debridements and hyperbaric oxygen along with IV antibiotics. 6/26; I put her in for a trial of hyperbaric oxygen only because of the dramatic response she has had with wounds on her left midfoot earlier this year which was a surgical wound that went straight to her bone over the metatarsal heads and also remotely the left third toe. We will see if we can get this through our review process and insurance. She arrives in clinic with again purulent material pouring out  of necrotic bone on the top of the foot distally. There is also some concerning erythema on the front of the leg that we marked. It is bit difficult to tell how tender this is because of neuropathy. I note from infectious disease that she had her vancomycin extended. All the cultures of these areas have shown MRSA sensitive to  vancomycin. She had the wound VAC on for part of the week. The rest of the time she is putting various things on this including Medihoney, "ionized water" silver sorb gel etc. 7/7; follow-up along with HBO. She is still on vancomycin at dialysis. She has a large open area on the dorsal right foot and a small dark eschar area on her heel. There is a lot less erythema in the area and a lot less tenderness. From an infection point of view I think this is better. She still has a lot of necrosis in the remaining right forefoot [previous TMA] we are still using the wound VAC in this area 7/16; follow-up along with HBO. I put her on linezolid after she finished her vancomycin. We started this last Friday I gave her 2 weeks worth. I had the expectation that she would be operatively debrided by Dr. Marla Roe but that still has not happened yet. Patient phoned the office this week. She arrives for review today after HBO. The distal part of this wound is completely necrotic. Nonviable pieces of tendon bone was still purulent drainage. Also concerning that she has black eschar over the heel that is expanding. I think this may be indicative of infection in this area as well. She has less erythema and warmth in the ankle and calf but still an abnormal exam 7/21 follow-up along with HBO. I will renew her linezolid after checking a CBC with differential monitoring her blood counts especially her platelets. She was supposed to have surgery yesterday but if I am reading things correctly this was canceled after her blood sugar was found to be over 500. I thought Dr. Marla Roe who called me said that they were sending her to the ER but the patient states that was not the case. 7/28. Follow-up along with HBO. She is on linezolid I still do not have any lab work from dialysis even though I called last week. The patient is concerned about an area on her left lateral foot about the level of the base of her  fifth metatarsal. I did not really see anything that ominous here however this patient is in [place] ability to point out problems that she is sensing and she has been accurate in the past Finally she received a call from Dr. Marla Roe who is referring her to another orthopedic surgeon stating that she is too booked up to take her to the operating room now. Was still using a wound VAC on the foot 8/3 -Follow-up after HBO, she is got another week of linezolid, she is to call ID for an appointment, x-rays of both feet were reviewed, the left foot x-ray with third MTP joint osteo- Right foot x-ray widespread osteo-in the right midfoot Right ankle x-ray does not show any active evidence of infection 8/11-Patient is seen after HBO, the wounds on the right foot appear to be about the same, the heel wound had some necrotic base over tendon that was debrided with a curette 8/21; patient is seen after HBO. The patient's wound on her dorsal foot actually looks reasonably good and there is substantial amount of epithelialization however the open  area distally still has a lot of necrotic debris partially bone. I cannot really get a good sense of just how deep this probes under the foot. She has been pressuring me this week to order medical maggots through a company in [place] for her. The problem I have is there is not a defined wound area here. On the positive side there is no purulence. She has been to see infectious disease she is still on Septra DS although I have not had a chance to review their notes 8/28; patient is seen in conjunction with HBO. The wounds on her foot continued to improve including the right dorsal foot substantially the, the distal part of this wound and the area on the right heel. We have been using a wound VAC over this chronically. She is still on trimethoprim as directed by infectious disease 9/4; patient is seen in conjunction with HBO. Right dorsal foot wound substantially  anteriorly is better however she continues to have a deep wound in the distal part of this that is not responding. We have been using silver collagen under border foam Area on the right plantar medial heel seems better. We have been using Hydrofera Blue 12/12/18 on evaluation today patient appears to be doing about the same with regard to her wound based on prior measurements. She does have some necrotic tissue noted on the lateral aspect of the wound that is going require a little bit of sharp debridement today. This includes what appears to be potentially either severely necrotic bone or tendon. Nonetheless other than that she does not appear to have any severe infection which is good news 9/18; it is been 2 weeks since I saw this wound. She is tolerating HBO well. Continued dramatic improvement in the area on the right dorsal foot. She still has a small wound on the heel that we have been using Hydrofera Blue. She continues with a wound VAC 9/24; patient has to be seen emergently today with a swelling on her right lateral lower leg. She says that she told Dr. Evette Doffing about this and also myself on a couple of occasions but I really have no recollection of this. She is not systemically unwell and her wound really looked good the last time I saw this. She showed this to providers at dialysis and she was able to verify that she was started on cephalexin today for 5 doses at dialysis. She dialyzes on Tuesday Thursday and Saturday. 10/2; patient is seen in conjunction with HBO. The area that is draining on the right anterior medial tibia is more extensive. Copious amounts of serosanguineous drainage with some purulence. We are still using the wound VAC on the original wound then it is stable. Culture I did of the original IandD showed MRSA I contacted dialysis she is now on vancomycin with dialysis treatments. I asked them to run a month 10/9; patient seen in conjunction with HBO. She had a new  spontaneous open area just above the wound on the right medial tibia ankle. More swelling on the right medial tibia. Her wound on the foot looks about the same perhaps slightly better. There is no warmth spreading up her leg but no obvious erythema. her MRI of the foot and ankle and distal tib-fib is not booked for next Friday I discussed this with her in great detail over multiple days. it is likely she has spreading infection upper leg at least involving the distal 25% above the ankle. She knows that if I refer  her to orthopedics for infectious disease they are going to recommend amputation and indeed I am not against this myself. We had a good trial at trying to heal the foot which is what she wanted along with antibiotics debridement and HBO however she clearly has spreading infection [probably staph aureus/MRSA]. Nevertheless she once again tells me she wants to wait the left of the MRI. She still makes comments about having her amputation done in [place]. 10/19; arrives today with significant swelling on the lateral right leg. Last culture I did showed Klebsiella. Multidrug- resistant. Cipro was intermediate sensitivity and that is what I have her on pending her MRI which apparently is going to be done on Thursday this week although this seems to be moving back and forth. She is not systemically unwell. We are using silver alginate on her major wound area on the right medial foot and the draining areas on the right lateral lower leg 10/26; MRI showed extensive abscess in the anterior compartment of the right leg also widespread osteomyelitis involving osseous structures of the midfoot and portions of the hindfoot. Also suspicion for osteomyelitis anterior aspect of the distal medial malleolus. Culture I did of the purulence once again showed a multidrug-resistant Klebsiella. I have been in contact with nephrology late last week and she has been started on cefepime at dialysis to replace  the vancomycin We sent a copy of her MRI report to Dr. Geroge Baseman in [place] who is an orthopedic surgeon. The patient takes great stock in his opinion on this. She says she will go to [place] to have her leg amputated if Dr. Geroge Baseman does not feel there is any salvage options. 11/2; she still is not talk to her orthopedic surgeon in [place]. Apparently he will call her at [phone] this afternoon. The quality of this is she has not allowed me to refer her anywhere. She has been told over and over that she needs this amputated but has not agreed to be referred. She tells me her blood sugar was 600 last night but she has not been febrile. 11/9; she never did got a call from the orthopedic surgeon in [place] therefore that is off the radar. We have arranged to get her see orthopedic surgery at [facility]. She still has a lot of draining purulence coming out of the new abscess in her right leg although that probably came from the osteomyelitis in her right foot and heel. Meanwhile the original wound on the right foot looks very healthy. Continued improvement. The issue is that the last MRI showed osteomyelitis in her right foot extensively she now has an abscess in the right anterior lower leg. There is nobody in [place] who will offer this woman anything but an amputation and to be honest that is probably what she needs. I think she still wants to talk about limb salvage although at this point I just do not see that. She has completed her vancomycin at dialysis which was for the original staph aureus she is still on cefepime for the more recent Klebsiella. She has had a long course of both of these antibiotics which should have benefited the osteomyelitis on the right foot as well as the abscess. 11/16; apparently [place] elective surgery is shut down because of COVID-19 pandemic. I have reached out to some contacts at [facility] to see if we can get her an orthopedic appointment there. I am  concerned about continually leaving this but for the moment everything is static. In fact her original  large wound on this foot is closing down. It is the abscess on the right anterior leg that continues to drain purulent serosanguineous material. She is not currently on any antibiotics however she had a prolonged course of vancomycin [1 month] as well as cefepime for a month 02/24/2019 on evaluation today patient appears to be doing better than the last time I saw her. This is not a patient that I typically see. With that being said I am covering for Dr. Dellia Nims this week and again compared to when I last saw her overall the wounds in particular seem to be doing significantly better which is good news. With that being said the patient tells me several disconcerting things. She has not been able to get in to see anyone for potential debridement in regard to her leg wounds although she tells me that she does not think it is necessary any longer because she is taking care of that herself. She noticed a string coming out of the lower wound on her leg over the last week. The patient states that she subsequently decided that we must of pack something in there and started pulling the string out and as it kept coming and coming she realized this was likely her tendon. With that being said she continued to remove as much of this as she could. She then I subsequently proceeded to using tubes of antibiotic ointment which she will stick down into the wound and then scored as much as she can until she sees it coming out of the other wound opening. She states that in doing this she is actually made things better and there is less redness and irritation. With regard to her foot wound she does have some necrotic tendon and tissue noted in one small corner but again the actual wound itself seems to be doing better with good granulation in general compared to my last evaluation. 12/7; continued improvement in the  wound on the substantial part of the right medial foot. Still a necrotic area inferiorly that required debridement but the rest of this looks very healthy and is contracting. She has 2 wounds on the right lateral leg which were her original drainage sites from her abscess but all of this looks a lot better as well. She has been using silver alginate after putting antibiotic biotic ointment in one wound and watching it come out the other. I have talked to her in some detail today. I had given her names of orthopedic surgeons at [facility] for second opinion on what to do about the right leg. I do not think the patient never called them. She has not been able to get a hold of the orthopedic surgeon in [place] that she had put a lot of faith in as being somebody would give her an opinion that she would trust. I talked to her today and said even if I could get her in to another orthopedic surgeon about the leg which she accept an amputation and she said she would not therefore I am not going to press this issue for the moment 12/14; continued improvement in his substantial wound on the right medial foot. There is still a necrotic area inferiorly with tightly adherent necrotic debris which I have been working on debriding each time she is here. She does not have an orthopedic appointment. Since last time she was here I looked over her cultures which were essentially MRSA on the foot wound and gram- negative rods in the abscess on  the anterior leg. 12/21; continued improvement in the area on the right medial foot. She is not up on this much and that is probably a good thing since I do not know it could support continuous ambulation. She has a small area on the right lateral leg which were remanence of the IandD's I did because of the abscess. I think she should probably have prophylactic antibiotics I am going to have to look this over to see if we can make an intelligent decision here. In the  meantime her major wound is come down nicely. Necrotic area inferiorly is still there but looks a lot better 04/06/2019; she has had some improvement in the overall surface area on the right medial foot somewhat narrowedr both but somewhat longer. The areas on the right lateral leg which were initial IandD sites are superficial. Nothing is present on the right heel. We are using silver alginate to the wound areas 1/18; right medial foot somewhat smaller. Still a deep probing area in the most distal recess of the wound. She has nothing open on the right leg. She has a new wound on the plantar aspect of her left fourth toe which may have come from just pulling skin. The patient using Medihoney on the wound on her foot under silver alginate. I cannot discourage her from this 2/1; 2-week follow-up using silver alginate on the right foot and her left fourth toe. The area on the right dorsal foot is contracted although there is still the deep area in the most distal part of the wound but still has some probing depth. No overt infection 2/15; 2-week follow-up. She continues to have improvement in the surface area on the dorsal right foot. Even the tunneling area from last time is almost closed. The area that was on the plantar part of her left fourth toe over the PIP is indeed closed 3/1; 2-week follow-up. Continued improvement in surface area. The original divot that we have been debriding inferiorly I think has full epithelialization although the epithelialization is gone down into the wound with probably 4 mm of depth. Even under intense illumination I am unable to see anything open here. The remanence of the wound in this area actually look quite healthy. We have been using silver alginate 3/15; 2-week follow-up. Unfortunately not as good today. She has a comma shaped wound on the dorsal foot however the upper part of this is larger. Under illumination debris on the surface She also tells [place] that she  was on her right leg 2 times in the last couple of weeks mostly to reach up for things above her head etc. She felt a sharp pain in the right leg which she thinks is somewhere from the ankle to the knee. The patient has neuropathy and is really uncertain. She cannot feel her foot so she does not think it was coming from there 3/29; 2-week follow-up. Her wound measures smaller. Surface of the wound appears reasonable. She is using silver alginate with underlying Medihoney. She has home health. X-rays I did of her tib-fib last time were negative although it did show arterial calcification 4/12; 2-week follow-up. Her wound measures smaller in length. Using manuka honey with silver alginate on top. She has home health. 4/26; 2-week follow-up. Her wound is smaller but still very adherent debris under illumination requiring debridement she has been using manuka honey with silver alginate. She has home Chiropractor) Signed: 07/27/2019 5:40:04 PM By: Linton Ham MD Entered By: Linton Ham  on 07/27/2019 13:28:19 -------------------------------------------------------------------------------- Physical Exam Details Patient Name: Date of Service: LIZZA, HUFFAKER 07/27/2019 12:45 PM Medical Record Number:[id number] Patient Account Number: [id number] Date of Birth/Sex: Treating RN: 10/03/1970 (49 y.o. Nancy Fetter Primary Care Provider: [name] Other Clinician: Referring Provider: Treating Provider/Extender:Verity Gilcrest, [name], [name] in Treatment: 67 Cardiovascular Pedal pulses are palpable. Notes Wound exam; wound measures smaller however under illumination a very fibrinous surface. Using a #5 curette the wound was debrided very gritty material hemostasis with direct pressure. There is no evidence of surrounding infection Electronic Signature(s) Signed: 07/27/2019 5:40:04 PM By: Linton Ham MD Entered By: Linton Ham on 07/27/2019  13:29:05 -------------------------------------------------------------------------------- Physician Orders Details Patient Name: Date of Service: Leda [name] 07/27/2019 12:45 PM Medical Record Number:[id number] Patient Account Number: [id number] Date of Birth/Sex: Treating RN: 1970/11/12 (50 y.o. Nancy Fetter Primary Care Provider: [name] Other Clinician: Referring Provider: Treating Provider/Extender:Asyria Kolander, [name], [name] in Treatment: 87 Verbal / Phone Orders: No Diagnosis Coding ICD-10 Coding Code Description M86.671 Other chronic osteomyelitis, right ankle and foot L97.514 Non-pressure chronic ulcer of other part of right foot with necrosis of bone E10.621 Type 1 diabetes mellitus with foot ulcer Follow-up Appointments Return Appointment in 2 weeks. Dressing Change Frequency Wound #43 Right,Medial Foot Change dressing three times week. Wound Cleansing Clean wound with Wound Cleanser - Anasept to all wounds Primary Wound Dressing Wound #43 Right,Medial Foot Hydrofera Blue - Classic - moistened with saline Secondary Dressing Wound #43 Right,Medial Foot Kerlix/Rolled [name] Dry [name] Edema Control Elevate legs to the level of the heart or above for 30 minutes daily and/or when sitting, a frequency of: - throughout the day [facility] nursing for wound care. - Interim Electronic Signature(s) Signed: 07/27/2019 5:33:33 PM By: [name] RN, BSN Signed: 07/27/2019 5:40:04 PM By: Linton Ham MD Entered By: [name] on 07/27/2019 13:06:27 -------------------------------------------------------------------------------- Problem List Details Patient Name: Date of Service: Leda [name] 07/27/2019 12:45 PM Medical Record Number:[id number] Patient Account Number: [id number] Date of Birth/Sex: Treating RN: Feb 24, 1971 (49 y.o. Nancy Fetter Primary Care Provider: [name] Other  Clinician: Referring Provider: Treating Provider/Extender:Kera Deacon, [name], [name] in Treatment: 42 Active Problems ICD-10 Encounter Code Description Active Date MDM Diagnosis M86.671 Other chronic osteomyelitis, right ankle and foot 09/03/2018 No Yes L97.514 Non-pressure chronic ulcer of other part of right foot with 09/03/2018 No Yes necrosis of bone E10.621 Type 1 diabetes mellitus with foot ulcer 09/24/2018 No Yes Inactive Problems ICD-10 Code Description Active Date Inactive Date L97.521 Non-pressure chronic ulcer of other part of left foot limited to 04/20/2019 04/20/2019 breakdown of skin L97.812 Non-pressure chronic ulcer of other part of right lower leg with 02/24/2019 02/24/2019 fat layer exposed Resolved Problems ICD-10 Code Description Active Date Resolved Date L02.415 Cutaneous abscess of right lower limb 12/25/2018 12/25/2018 Electronic Signature(s) Signed: 07/27/2019 5:40:04 PM By: Linton Ham MD Entered By: Linton Ham on 07/27/2019 13:27:32 -------------------------------------------------------------------------------- Progress Note Details Patient Name: Date of Service: Leda [name] 07/27/2019 12:45 PM Medical Record Number:[id number] Patient Account Number: [id number] Date of Birth/Sex: Treating RN: [birth year] (49 y.o. Nancy Fetter Primary Care Provider: [name] Other Clinician: Referring Provider: Treating Provider/Extender:Devi Hopman, [name], [name] in Treatment: 75 Subjective History of Present Illness (HPI) 49 year old diabetic who is known to have type 1 diabetes which is poorly controlled last hemoglobin A1c was 11%. She comes in with a ulcerated area on the left lateral foot which has been there for over 6 months. Was  recently she has been treated by [name] of podiatry who saw her last on 05/28/2016. Review of his notes revealed that the patient had incision and drainage with placement of antibiotic beads  to the left foot on 04/11/2016 for possible osteomyelitis of the cuboid bone. Over the last year she's had a history of amputation of the left fifth toe and a femoropopliteal popliteal bypass graft somewhere in April 2017. 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 His note [name] mentions that the patient has been referred to me for further wound care and possibly great candidate for hyperbaric oxygen therapy due to recurrent osteomyelitis. However we do not have any x-rays of biopsy reports confirming this. He has been on several antibiotics including Bactrim and most recently is on doxycycline for an MRSA. I understand, the patient was not a candidate for IV antibiotics as she has had previous PICC lines which resulted in blood clots in both arms. There was a x-ray report dated 04/04/2016 on [name] notes which showed evidence of fifth ray resection left foot with osteolytic changes noted to the fourth metatarsal and cuboid bone on the left. 06/13/2016 -- had a left foot x-ray which showed no acute fracture or dislocation and no definite radiographic evidence of osteomyelitis. Advanced osteopenia was seen. 06/20/2016 -- she has noticed a new wound on the right plantar foot in the region where she had a callus before. 06/27/16- the patient did have her x-ray of the right foot which showed no findings to suggest osteomyelitis. She saw her endocrinologist, [name], yesterday. Her A1c in January was 11. He also indicates mismanagement and noncompliance regarding her diabetes. She is currently on Bactrim for a lip infection. She is complaining of nausea, vomiting and diarrhea. She is unable to articulate the exact orders or dosing of the Bactrim; it is unclear when she will complete this. 07/04/2016 -- results from [facility] of ABIs with ankle waveforms were noted from 02/14/2016. The examination done on 06/27/2015 showed noncompressible ABIs with the right being 1.45 and  the left being 1.33. The present examination showed a right ABI of 1.19 on the left of 1.33. The conclusion was that right normal ABI in the lower extremity at rest however compared to previous study which was noncompressible ABI may be falsely elevated side suggesting medial calcification. The left ABI suggested medial calcification. 08/01/2016 -- the patient had more redness and pain on her right foot and did not get to come to see as noted she see her PCP or go to the ER and decided to take some leftover metronidazole which she had at home. As usual, the patient does report she feels and is rather noncompliant. 08/08/2016 -- -- x-ray of the right foot -- FINDINGS:Transmetatarsal amputation is noted. No bony destruction is noted to suggest osteomyelitis. IMPRESSION: No evidence of osteomyelitis. Postsurgical changes are seen. MRI would be more sensitive for possible bony changes. Culture has grown Serratia Marcescens -- sensitive to Bactrim, ciprofloxacin, ceftazidime she was seen by [name] on 08/06/2016. He did not find any exposed bone, muscle, tendon, ligament or joint. There was no malodor and he did a excisional debridement in the office. ============ Old notes: 49 year old patient who is known to the wound clinic for a while had been away from the wound clinic since 09/01/2014. Over the last several months she has been admitted to various hospitals including [facility]. She was treated for a right metatarsal osteomyelitis with a transmetatarsal amputation  and this was done about 2 months ago. He has a small ulcerated area on the right heel and she continues to have an ulcerated area on the left plantar aspect of the foot. The patient was recently admitted to the [facility] hospital group between 7/12 and 10/18/2014. she was given 3 weeks of IV vancomycin and was to follow-up with her surgeons at [facility] and also took oral vancomycin for C. difficile  colitis. 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 MRI of the left foot done on 10/14/2014 showed no abscess or osteomyelitis. 04/27/15; this is a patient we know from previous stays in the wound care center. She is a type I diabetic I am not sure of her control currently. Since the last time I saw her she is had a right transmetatarsal amputation and has no wounds on her right foot and has no open wounds. She is been followed at the wound care center at [facility] in [place]. She comes today with the desire to undergo hyperbaric treatment locally. Apparently one of her wound care providers in [place] has suggested hyperbarics. This is in response to an MRI from 04/18/15 that showed increased marrow signal and loss of the proximal fifth metatarsal cortex evidence of osteomyelitis with likely early osteomyelitis in the cuboid bone as well. She has a large wound over the base of the fifth metatarsal. She also has a eschar over her the tips of her toes on 1,3 and 5. She does not have peripheral pulses and apparently is going for an angiogram tomorrow which seems reasonable. After this she is going to infectious disease at [facility]. They have been using Medihoney to the large wound on the lateral aspect of the left foot to. The patient has known Charcot deformity from diabetic neuropathy. She also has known diabetic PAD. Surprisingly I can't see that she has had any recent antibiotics, the patient states the last antibiotic she had was at the end of November for 10 days. I think this was in response to culture that showed group G strep although I'm not exactly sure where the culture was from. She is also had  arterial studies on 03/29/15. This showed a right ABI of 1.4 that was noncompressible. Her left ABI was 0.73. 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 It was not felt that arterial inflow was adequate for healing of a foot ulcer. Her Doppler waveforms looked monophasic ===== READMISSION 02/28/17; this is in an now 49 year old woman we've had at several different occasions in this clinic. 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 She has a remote ex-smoker. She was last seen in this clinic by Dr. Con Memos I think in May. More recently she is been followed by her podiatrist [name] an infectious disease Dr. Megan Salon. She has 2 open wounds the major one is over the right first metatarsal head she also has a wound on the left plantar foot. an MRI of the right foot on 01/01/17 showed a soft tissue ulcer along the plantar aspect of the first metatarsal base consistent with osteomyelitis of the first metatarsal stump. Dr. 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 According to the patient this is  been open for slightly over a month. She has been on a combination of Cipro 500 twice a day, Zyvox 600 twice a day and Flagyl 500 3 times a day for over a month now as directed by Dr. Megan Salon. cultures of the right foot earlier this year showed MRSA in January and Serratia in May. January also had a few viridans strep. Recent x-rays of both feet were done and [name] office and I don't have these reports. The patient has known PAD and has a history of aleft femoropopliteal bypass in April 2017. 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 Dwyane Dee. In spite of this the last hemoglobin A1c I can see is 10.1 on 01/01/2017. She is being referred by [name]  for consideration of hyperbaric oxygen for chronic refractory osteomyelitis involving the right first metatarsal head with a Wagner 3 wound over this area. She is been using Medihoney to this area and also an area on the left midfoot. She is using healing sandals bilaterally. ABIs in this clinic at the left posterior tibial was 1.1 noncompressible on the right READMISSION Non invasive vascular NOVANT 5/18 Aftercare following surgery of the circulatory system Procedure Note - Interface, External Ris In - 08/13/2016 11:05 AM EDT Procedure: Examination consists of physiologic resting arterial pressures of the brachial and ankle arteries bilaterally with continuous wave Doppler waveform analysis. Previous: Previous exam performed on 02/14/16 demonstrated ABIs of Rt = 1.19 and Lt = 1.33. Right: ABI = non-compressible PT, 1.47 DP. S/P transmet amputation. Left: ABI = 1.52, 2nd digit pressure = 87 mmHg Conclusions: Right: ABI (>1.3) may be falsely elevated, suggesting medial calcification. Left: ABI (>1.3) may be falsely elevated, suggesting medial calcification The patient is a now 49 year old type I diabetic is had multiple issues her graded to chronic diabetic foot ulcers. 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 We had her in the clinic lastin November. At that point she had wounds on her bilateral feet.she had wanted to try hyperbarics however the healogics review process denied her because she hadn't followed up with her vascular surgeon for her left femoropopliteal bypass. The bypass was done by Dr. Raul Del at [facility]. We made her a follow-up with Dr. Raul Del however she did not keep the appointment and therefore she was not approved The patient shows me a small wound on her left fourth metatarsal head on her phone. 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 She was admitted acutely ill with a temperature of 103. She was started on broad-spectrum vancomycin and cefepime. On 05/06/17 she was taken to the OR by [name] her podiatric surgeon for an incision and drainage irrigation of the left foot wound. Cultures from this surgery revealed group be strep and anaerobes. she was seen by Dr.Xu of orthopedic surgery and scheduled for a below-knee amputation which she u refused. Ultimately she was discharged on Levaquin and Flagyl for one month. MRI 05/05/17 done while she was in the hospital showed abscess adjacent to the fourth metatarsal head and neck small abscess around the fourth flexor tendon. Inflammatory phlegmon and gas in the soft tissues along the lateral aspect of the  fourth phalanx. Findings worrisome for osteomyelitis involving the fourth proximal and middle phalanx and also the third and fourth metatarsals. Finally the patient had actually shortly before this followed up with Dr. Raul Del at no time on 04/29/17. He felt that her left femoropopliteal bypass was patent he felt that her left-sided toe pressures more than adequate for healing a wound on the left foot. This was before her acute presentation. 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 05/28/17; she is started hyperbarics. The patient tells me that for some reason she was not actually on Levaquin but I think on ciprofloxacin. She was on Flagyl. She only started her Levaquin yesterday due to some difficulty with the pharmacy and perhaps her sister picking it up. She has an appointment with [name] tomorrow and with infectious disease early next week. She has no new complaints 06/06/17; the patient continues in hyperbarics. She saw [name] on 05/29/17 who is her podiatric surgeon. He is elected for a transmetatarsal amputation on 06/27/17. I'm not sure at what level  he plans to do this amputation. The patient is unaware ooShe also saw Dr. Megan Salon of infectious disease who elected to continue her on current antibiotics I think this is ciprofloxacin and Flagyl. I'll need to clarify with her tomorrow if she actually has this. We're using silver alginate to the actual wound. Necrotic surface today with material under the flap of her foot. ooOriginal MRI showed abscesses as well as osteomyelitis of the proximal and middle fourth phalanx and the third and fourth metatarsal heads 06/11/17; patient continues in hyperbarics and continues on oral antibiotics. She is doing well. The wound looks better. The necrotic part of this under the flap in her superior foot also looks better. she is been to see [name]. I haven't had a chance to look at his note. Apparently he has put the transmetatarsal amputation on hold her request it is still planning to take her to the OR for debridement and product application ACEL. I'll see if I can find his note. I'll therefore leave product ordering/requests to [name] for now. I was going to look at Dermagraft 06/18/17-she is here in follow-up evaluation for bilateral foot wounds. She continues with hyperbaric therapy. She states she has been applying manuka honey to the right plantar foot and alternate manuka honey and silver alginate to the left foot, despite our orders. We will continue with same treatment plan and she will follo up next week. 06/25/17; I have reviewed [name] last note from 3/11. She has operative debridement in 2 days' time. By review his note apparently they're going to place there is skin over the majority of this wound which is a good choice. She has a small satellite area at the most proximal part of this wound on the left plantar foot. The area on the right plantar foot we've been using silver alginate and it is close to healing. 07/02/17; unfortunately the patient was not easily approved for [name]  proposed surgery. I'm not completely certain what the issue is. She has been using silver alginate to the wound she has completed a first course of hyperbarics. She is still on Levaquin and Flagyl. I have really lost track of the time course here.I suspect she should have another week to 2 of antibiotics. I'll need to see if she is followed up with infectious disease Dr. Megan Salon 07/09/17; the patient is followed up with Dr. Megan Salon. She has a severe deep diabetic infection of her left foot  with a deep surgical wound. She continues on Levaquin and metronidazole continuing both of these for now I think she is been on fr about 6 weeks. She still has some drainage but no pain. No fever. Her had been plans for her to go to the OR for operative debridement with her podiatrist [name], I am not exactly sure where that is. I'll probably slip a note to [name] today. I note that she follows with Dr. Dwyane Dee of endocrinology. We have her recertified for hyperbaric oxygen. I have not heard about Dermagraft however I'll see if [name] is planning a skin substitute as well 07/16/17; the patient tells me she is just about out of [place]. I'll need to check Dr. Hale Bogus last notes on this. She states she has plenty of Flagyl however. She comes in today complaining of pain in the right lateral foot which she said lasted for about a day. The wound on the right foot is actually much more medially. She also tells me that the [facility] cost a lot of pain in the left foot wound and she turned back to silver alginate. Finally Dermagraft has a $716 per application co-pay. She cannot afford this 07/23/17; patient arrives today with the wound not much smaller. There is not much new to add. She has not heard from [name] all try to put in a call to them today. She was asking about Dermagraft again and she has an over $967 per application co-pay she states that she would be willing to try to do a payment plan. I  been tried to avoid this. We've been using silver alginate, I'll change to [facility] 07/30/17-She is here in follow-up evaluation for left foot ulcer. She continues hyperbaric medicine. The left foot ulcer is stable we will continue with same treatment plan 08/06/17; she is here for evaluation of her left foot ulcer. Currently being treated for hyperbarics or underlying osteomyelitis. She is completed antibiotics. The left foot ulcer is better smaller with healthier looking granulation. For various reasons I am not really clear on we never got her back to the OR with [name]. He did not respond to my secure text message. Nevertheless I think that surgery on this point is not necessary nor am I completely clear that a skin substitute is necessary The patient is complaining about pain on the outside of her right foot. 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 There is no erythema. She also states the foot is warm versus her other part of her upper leg and this is largely true. It is not totally clear to me what's causing this. She thinks it's different from her usual neuropathy pain 08/13/17; she arrives in clinic today with a small wound which is superficial on her right first metatarsal head. 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 She tells [place] she was up on her feet over the Mother's Day celebration. ooThe large wound is on the left foot. Continues with hyperbarics for underlying osteomyelitis. We're using Hydrofera Blue. She asked me today about where we were with Dermagraft. I had actually excluded this because of the co-pay however she wants to assume this therefore I'll recheck the co-pay an order for next week. 08/20/17; the patient agreed to accept the co-pay of the first Apligraf which we applied today. She is disappointed she is finishing hyperbarics will run this through the insurance on the extent of the foot infection and the extent of the wound  that she had however she is already had 60 dive's. Dermagraft No. 1 08/27/17; Dermagraft No. 2. She is not eligible for any more hyperbaric treatments this month. She reports a fair amount of drainage and she actually changed to the external dressings without disturbing the direct contact layer 09/03/17; the patient arrived in clinic today with the wound superficially looking quite healthy. Nice vibrant red tissue with some advancing epithelialization although not as much adherence of the flap as I might like. However she noted on her own fourth toe some bogginess and she brought that to our attention. Indeed this was boggy feeling like a possibility of subcutaneous fluid. 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 She is not been unwell. We've been using Dermagraft 09/10/17; the culture that I did not last week was MRSA. She saw Dr. Megan Salon this morning who is going to start her on vancomycin. I had sent him a secure a text message yesterday. I also spoke with her podiatric surgeon [name] about surgery on this foot the options for conserving a functional foot etc. Promised me he would see her and will make back consultation today. Paradoxically her actual wound on the plantar aspect of her left foot looks really quite good. I had given her 5 days worth of Baxdella to cover her for MRSA. Her MRI came back showing osteomyelitis within the third metatarsal shaft and head and base of the third and fourth proximal phalanx. She had extensive inflammatory changes throughout the soft tissue of the lateral forefoot. With an ill-defined fluid around the fourth metatarsal extending into the plantar and dorsal soft tissues 09/19/17; the patient is actually on oral Septra and Flagyl. She apparently refused IV vancomycin. She also saw [name] at my request who is planning her for a left BKA sometime in mid July. MRI showed osteomyelitis  within the third metatarsal shaft and head and the basis of the third and fourth proximal phalanx. I believe there was felt to be possible septic arthritis involving the third MTP. 09/26/17; the patient went back to Dr. Megan Salon at my suggestion and is now receiving IV daptomycin. Her wound continues to look quite good making the decision to proceed with a transmetatarsal amputation although more difficult for the patient. I believe in my extensive discussions with her she has a good sense of the pros and cons of this. I don't NV the tuft decision she has to make. She has an appointment with [name] I believe in mid July and I previously spoken to him about this issue Has we had used 3 previous Dermagraft. Given the condition of the wound surface I went ahead and added the fourth one today area and I did this not fully realizing that she'll be traveling to [place] next week. I'm hopeful she can come back in 2 weeks 10/21/17; Her same Dermagraft on for about 3-1/2 weeks. In spite of this the wound arrives looking quite healthy. There is been a lot of healing dimensions are smaller. Looking at the square shaped wound she has now there is some undermining and some depth medially under the undermining although I cannot palpate any bone. No surrounding infection is obvious. She has difficult questions about how to look at this going forward vis--vis amputations versus continued medical therapy. To be truthful the wound is looks so healthy and it is continued to contract. Hard to justify foot surgery at  this point although I still told her that I think it might come to that if we are not able to eradicate the underlying MRSA. She is still highly at risk and she understands this 11/06/17 on evaluation today patient appears to be doing better in regard to her foot ulcer. She's been tolerating the dressing changes without complication. Currently she is here for her Dermagraft #6. Her wound continues to  make excellent progress at this point. She does not appear to have any evidence of infection which is good news. 11/13/17 on evaluation today patient appears to be doing excellent at this time. She is here for repeat Dermagraft application. This is #7. Overall her wound seems to be making great progress. 12/05/17; the patient arrives with the wound in much better condition than when I last saw this almost 6 weeks ago. She still has a small probing area in the left metatarsal head region on the lateral aspect of her foot. We applied her last Dermagraft today. ooSince the last time she is here she has what appears to a been a blood blister on the plantar aspect of left foot although I don't see this is threatening. There is also a thick raised tissue on the right mid metatarsal head region. This was not there I don't think the last time she was here 3 weeks ago. 12/12/17; the patient continues to have a small programming area in the left metatarsal head region on the lateral aspect of her foot which was the initial large surgical wound. I applied her last Apligraf last week. I'm going to use Endoform starting today ooUnfortunately she has an excoriated area in the left mid foot and the right mid foot. The left midfoot looks like a blistered area this was not opened last week it certainly is open today. Using silver alginate on these areas. She promises me she is offloading this. 12/19/17; the small probing area in the left metatarsal head eyes think is shallower. In general her original wound looks better. We've been using Endoform. The area inferiorly that I think was trauma last week still requires debridement a lot of nonviable surface which I removed. She still has an open open area distally in her foot ooSimilarly on the right foot there is tightly adherent surface debris which I removed. Still areas that don't look completely epithelialized. This is a small open area. We used silver alginate on  these areas 12/26/2017; the patient did not have the supplies we ordered from last week including the Endoform. The original large wound on the left lateral foot looks healthy. She still has the undermining area that is largely unchanged from last week. She has the same heavily callused raised edged wounds on the right mid and left midfoot. Both of these requiring debridement. We have been using silver alginate on these areas 01/02/2018; there is still supply issues. We are going to try to use Prisma but I am not sure she actually got it from what she is saying. She has a new open area on the lateral aspect of the left fourth toe [previous fifth ray amputation]. Still the one tunneling area over the fourth metatarsal head. The area is in the midfoot bilaterally still have thick callus around them. She is concerned about a raised swelling on the lateral aspect of the foot. However she is completely insensate 01/10/2018; we are using Prisma to the wounds on her bilateral feet. Surprisingly the tunneling area over the left fourth metatarsal head that was part  of her original surgery has closed down. She has a small open area remaining on the incision line. 2 open areas in the midfoot. 02/10/2018; the patient arrives back in clinic after a month hiatus. She was traveling to visit family in [place]. Is fairly clear she was not offloading the areas on her feet. The original wound over the left lateral foot at the level of metatarsal heads is reopened and probes medially by about a centimeter or 2. She notes that a week ago she had purulent drainage come out of an area on the left midfoot. Paradoxically the worst area is actually on the right foot is extensive with purulent drainage. We will use silver alginate today 02/17/2018; the patient has 3 wounds one over the left lateral foot. She still has a small area over the metatarsal heads which is the remnant of her original surgical wound. This has medial  probing depth of roughly 1.4 cm somewhat better than last week. The area on the right foot is larger. We have been using silver alginate to all areas. The area on the right foot and left foot that we cultured last week showed both Klebsiella and Proteus. Both of these are quinolone sensitive. The patient put her's self on Bactrim and Flagyl that she had left hanging around from prior antibiotic usages. She was apparently on this last week when she arrived. I did not realize this. Unfortunately the Bactrim will not cover either 1 of these organisms. We will send in Cipro 500 twice daily for a week 03/04/2018; the patient has 2 wounds on the left foot one is the original wound which was a surgical wound for a deep DFU. At one point this had exposed bone. She still has an area over the fourth metatarsal head that probes about 1.4 cm although I think this is better than last week. I been using silver nitrate to try and promote tissue adherence and been using silver alginate here. ooShe also has an area in the left midfoot. This has some depth but a small linear wound. Still requiring debridement. ooOn the right midfoot is a circular wound. A lot of thick callus around this area. ooWe have been using silver alginate to all wound areas ooShe is completed the ciprofloxacin I gave her 2 weeks ago. 03/11/2018; the patient continues to have 2 open areas on the left foot 1 of which was the original surgical wound for a deep DFU. Only a small probing area remains although this is not much different from last week we have been using silver alginate. The other area is on the midfoot this is smaller linear but still with some depth. We have been using silver alginate here as well ooOn the right foot she has a small circular wound in the mid aspect. This is not much smaller than last time. We have been using silver alginate here as well 03/18/2018; she has 3 wounds on the left foot the original surgical  wound, a very superficial wound in the mid aspect and then finally the area in the mid plantar foot. She arrives in today with a very concerning area in the wound in the mid plantar foot which is her most proximal wound. There is undermining here of roughly 1-1/2 cm superiorly. Serosanguineous drainage. She tells me she had some pain on for over the weekend that shot up her foot into her thigh and she tells me that she had a nodule in the groin area. ooShe has the single wound  in the right foot. ooWe are using endoform to both wound areas 03/24/2018; the patient arrives with the original surgical wound in the area on the left midfoot about the same as last week. There is a collection of fluid under the surface of the skin extending from the surgical wound towards the midfoot although it does not reach the midfoot wound. The area on the right foot is about the same. Cultures from last week of the left midfoot wound showed abundant Klebsiella abundant Enterococcus faecalis and moderate methicillin resistant staph I gave her Levaquin but this would have only covered the Klebsiella. She will need linezolid 04/01/2018; she is taking linezolid but for the first few days only took 1 a day. I have advised her to finish this at twice daily dosing. In any case all of her wounds are a lot better especially on the left foot. The original surgical wound is closed. The area on the left midfoot considerably smaller. The area on the right foot also smaller. 04/08/2018; her original surgical wound/osteomyelitis on the left foot remains closed. She has area on the left foot that is in the midfoot area but she had some streaking towards this. This is not connected with her original wound at least not visually. ooSmall wound on the right midfoot appears somewhat smaller. 04/15/18; both wounds looks better. Original wound is better left midfoot. Using silver alginate 1/21; patient states she uses saltwater soak in,  stones or remove callus from around her wounds. She is also concerned about a blood blister she had on the left foot but it simply resolved on its own. We've been using silver alginate 1/28; the patient arrives today with the same streaking area from her metatarsals laterally [the site of her original surgical wound] down to the middle of her foot. There is some drainage in the subcutaneous area here. This concerns me that there is actually continued ongoing infection in the metatarsals probably the fourth and third. This fixates an MRI of the foot without contrast [chronic renal failure] ooThe wound in the mid part of the foot is small but I wonder whether this area actually connects with the more distal foot. ooThe area on the right midfoot is probably about the same. Callus thick skin around the small wound which I removed with a curette we have been using silver alginate on both wound areas 2/4; culture I did of the draining site on the left foot last time grew methicillin sensitive staph aureus. MRI of the left foot showed interval resolution of the findings surrounding the third metatarsal joint on the prior study consistent with treated osteomyelitis. Chronic soft tissue ulceration in the plantar and lateral aspect of the forefoot without residual focal fluid collection. No evidence of recurrent osteomyelitis. 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 She tells me she had nausea and vomiting over the weekend and missed 2 days. In general her foot looks much the same. She has a small open area just below the left fourth metatarsal head. A linear area in the left midfoot. Some discoloration extending from the inferior part of this into the left  lateral foot although this appears to be superficial. She has a small area on the right midfoot which generally looks smaller after  debridement 2/18; the patient is completing his cephalexin and has another 2 days. She continues to have open areas on the left and right foot. 2/25; she is now off antibiotics. The area on the left foot at the site of her original surgical wound has closed yet again. She still has open areas in the mid part of her foot however these appear smaller. The area on the right mid foot looks about the same. We have been using silver alginate She tells me she had a serious hypoglycemic spell at home. She had to have EMS called and get IV dextrose 3/3; disappointing on the left lateral foot large area of necrotic tissue surrounding the linear area. This appears to track up towards the same original surgical wound. Required extensive debridement. The area on the right plantar foot is not a lot better also using silver 3/12; the culture I did last time showed abundant enterococcus. I have prescribed Augmentin, should cover any unrecognized anaerobes as well. In addition there were a few MRSA and Serratia that would not be well covered although I did not want to give her multiple antibiotics. She comes in today with a new wound in the right midfoot this is not connected with the original wound over her MTP a lot of thick callus tissue around both wounds but once again she said she is not walking on these areas 3/17-Patient comes in for follow-up on the bilateral plantar wounds, the right midfoot and the left plantar wound. Both these are heavily callused surrounding the wounds. We are continuing to use silver alginate, she is compliant with offloading and states she uses a wheelchair fairly often at home 3/24; both wound areas have thick callus. However things actually look quite a bit better here for the majority of her left foot and the right foot. 3/31; patient continues  to have thick callused somewhat irritated looking tissue around the wounds which individually are fairly superficial. There is no evidence of surrounding infection. We have been using silver alginate however I change that to [facility] today 4/17; patient returns to clinic after having a scare with Covid she tested negative in her primary doctor's office. She has been using Hydrofera Blue. She does not have an open area on the right foot. On the left foot she has a small open area with the mid area not completely viable. She showed me pictures of what looks like a hemorrhagic blister from several days ago but that seems to have healed over this was on the lateral left foot 4/21; patient comes in to clinic with both her wounds on her feet closed. However over the weekend she started having pain in her right foot and leg up into the thigh. She felt as though she was running a low-grade fever but did not take her temperature. She took a doxycycline that she had leftover and yesterday a single Septra and metronidazole. She thinks things feel somewhat better. 4/28; duplex ultrasound I ordered last week was negative for DVT or superficial thrombophlebitis. She is completed the doxycycline I gave her. States she is still having a lot of pain in the right calf and right ankle which is no better than last week. She cannot sleep. She also states she has a temperature of up to 101, coughing and complaining of visual loss in her bilateral eyes. Apparently she was tested for Covid 2 weeks ago at [facility] and that was negative. Readmission: 09/03/18 patient presents back for reevaluation after having been evaluated  at the end of April regarding erythema and swelling of her right lower extremity. Subsequently she ended up going to the hospital on 07/29/18 and was admitted not to be discharged until 08/08/18. Unfortunately it was noted during the time that she was in the hospital that she did have  methicillin-resistant Staphylococcus aureus as the infection noted at the site. It was also determined that she did have osteomyelitis which appears to be fairly significant. She was treated with vancomycin and in fact is still on IV vancomycin at dialysis currently. This is actually slated to continue until 09/12/18 at least which will be the completion of the six weeks of therapy. Nonetheless based on what I'm seeing at this point I'm not sure she will be anywhere near ready to discontinue antibiotics at that time. Since she was released from the hospital she was seen by [name] who is her podiatrist on 08/27/18. His note specifically states that he is recommended that the patient needs of one knee amputation on the right as she has a life-threatening situation that can lead quickly to sepsis. The patient advised she would like to try to save her leg to which [name] apparently told her that this was against all medical advice. She also want to discontinue the Wound VAC which had been initiated due to the fact that she wasn't pleased with how the wound was looking and subsequently she wanted to pursue applying Medihoney at that time. He stated that he did not believe that the right lower extremity was salvageable and that the patient understood but would still like to attempt hyperbaric option therapy if it could be of any benefit. She was therefore referred back to [place] for further evaluation. He plans to see her back next week. Upon inspection today patient has a significant amount purulent drainage noted from the wound at this point. The bone in the distal portion of her foot also appears to be extremely necrotic and spongy. When I push down on the bone it bubbles and seeps purulent drainage from deeper in the end of the foot. I do not think that this is likely going to heal very well at all and less aggressive surgical debridement were undertaken more than what I believe we can likely do here  in our office. 09/12/2018; I have not seen this patient since the most recent hospitalization although she was in our clinic last week. I have reviewed some of her records from a complex hospitalization. 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 There is situation was complicated by MRSA bacteremia and acute on chronic renal failure now on dialysis. She is receiving vancomycin at dialysis. We started her on Dakin's wet-to-dry last week she is changing this daily. There is still purulent drainage coming out of her foot. Although she is apparently "agreeable" to a below-knee amputation which is been suggested by multiple clinicians she wants this to be done in [place]. She apparently has a telehealth visit with that provider sometime in late [place] 6/24. I have told her I think this is probably too long. Nevertheless I could not convince her to allow a local doctor to perform BKA. 09/19/2018; the patient has a large necrotic area on the right anterior foot. She has had previous transmetatarsal amputations. Culture I did last week showed MRSA nothing else she is on vancomycin at dialysis. She has continued leaking purulent drainage out of the distal part of the large circular wound on  the right anterior foot. She apparently went to see Dr. 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 Somehow that translated into her being referred to plastic surgery for debridement of the area. I gather she basically refused amputation although I do not have a copy of Dr. Berenice Primas notes. The patient really wants to have a trial of hyperbaric oxygen. I agreed with initial assessment in this clinic that this was probably too far along to benefit however if she is going to have plastic surgery I think she would benefit from ancillary hyperbaric oxygen. The issue here is that the patient has benefited as maximally as any patient I have ever seen  from hyperbaric oxygen therapy. Most recently she had exposed bone on the lateral part of her left foot after a surgical procedure and that actually has closed. She has eschared areas in both heels but no open area. She is remained systemically well. I am not optimistic that anything can be done about this but the patient is very clear that she wants an attempt. The attempt would include a wound VAC further debridements and hyperbaric oxygen along with IV antibiotics. 6/26; I put her in for a trial of hyperbaric oxygen only because of the dramatic response she has had with wounds on her left midfoot earlier this year which was a surgical wound that went straight to her bone over the metatarsal heads and also remotely the left third toe. We will see if we can get this through our review process and insurance. She arrives in clinic with again purulent material pouring out of necrotic bone on the top of the foot distally. There is also some concerning erythema on the front of the leg that we marked. It is bit difficult to tell how tender this is because of neuropathy. I note from infectious disease that she had her vancomycin extended. 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 She had the wound VAC on for part of the week. The rest of the time she is putting various things on this including Medihoney, "ionized water" silver sorb gel etc. 7/7; follow-up along with HBO. She is still on vancomycin at dialysis. She has a large open area on the dorsal right foot and a small dark eschar area on her heel. There is a lot less erythema in the area and a lot less tenderness. From an infection point of view I think this is better. She still has a lot of necrosis in the remaining right forefoot [previous TMA] we are still using the wound VAC in this area 7/16; follow-up along with HBO. I put her on linezolid after she finished her vancomycin. We started this last Friday I gave her  2 weeks worth. I had the expectation that she would be operatively debrided by Dr. Marla Roe but that still has not happened yet. Patient phoned the office this week. She arrives for review today after HBO. The distal part of this wound is completely necrotic. Nonviable pieces of tendon bone was still purulent drainage. Also concerning that she has black eschar over the heel that is expanding. I think this may be indicative of infection in this area as well. She has less erythema and warmth in the ankle and calf but still an abnormal exam 7/21 follow-up along with HBO. I will renew her linezolid after checking a CBC with differential monitoring her blood counts especially her platelets. She was supposed to have surgery yesterday but if I am  reading things correctly this was canceled after her blood sugar was found to be over 500. I thought Dr. Marla Roe who called me said that they were sending her to the ER but the patient states that was not the case. 7/28. Follow-up along with HBO. She is on linezolid I still do not have any lab work from dialysis even though I called last week. The patient is concerned about an area on her left lateral foot about the level of the base of her fifth metatarsal. I did not really see anything that ominous here however this patient is in [place] ability to point out problems that she is sensing and she has been accurate in the past Finally she received a call from Dr. Marla Roe who is referring her to another orthopedic surgeon stating that she is too booked up to take her to the operating room now. Was still using a wound VAC on the foot 8/3 -Follow-up after HBO, she is got another week of linezolid, she is to call [facility] for an appointment, x-rays of both feet were reviewed, the left foot x-ray with third MTP joint osteo- Right foot x-ray widespread osteo-in the right midfoot Right ankle x-ray does not show any active evidence of infection 8/11-Patient is seen  after HBO, the wounds on the right foot appear to be about the same, the heel wound had some necrotic base over tendon that was debrided with a curette 8/21; patient is seen after HBO. The patient's wound on her dorsal foot actually looks reasonably good and there is substantial amount of epithelialization however the open area distally still has a lot of necrotic debris partially bone. I cannot really get a good sense of just how deep this probes under the foot. She has been pressuring me this week to order medical maggots through a company in [place] for her. The problem I have is there is not a defined wound area here. On the positive side there is no purulence. She has been to see infectious disease she is still on Septra DS although I have not had a chance to review their notes 8/28; patient is seen in conjunction with HBO. The wounds on her foot continued to improve including the right dorsal foot substantially the, the distal part of this wound and the area on the right heel. We have been using a wound VAC over this chronically. She is still on trimethoprim as directed by infectious disease 9/4; patient is seen in conjunction with HBO. Right dorsal foot wound substantially anteriorly is better however she continues to have a deep wound in the distal part of this that is not responding. We have been using silver collagen under border foam ooArea on the right plantar medial heel seems better. We have been using Hydrofera Blue 12/12/18 on evaluation today patient appears to be doing about the same with regard to her wound based on prior measurements. She does have some necrotic tissue noted on the lateral aspect of the wound that is going require a little bit of sharp debridement today. This includes what appears to be potentially either severely necrotic bone or tendon. Nonetheless other than that she does not appear to have any severe infection which is good news 9/18; it is been 2 weeks  since I saw this wound. She is tolerating HBO well. Continued dramatic improvement in the area on the right dorsal foot. She still has a small wound on the heel that we have been using Hydrofera Blue. She continues with  a wound VAC 9/24; patient has to be seen emergently today with a swelling on her right lateral lower leg. She says that she told Dr. Evette Doffing about this and also myself on a couple of occasions but I really have no recollection of this. She is not systemically unwell and her wound really looked good the last time I saw this. She showed this to providers at dialysis and she was able to verify that she was started on cephalexin today for 5 doses at dialysis. She dialyzes on Tuesday Thursday and Saturday. 10/2; patient is seen in conjunction with HBO. The area that is draining on the right anterior medial tibia is more extensive. Copious amounts of serosanguineous drainage with some purulence. We are still using the wound VAC on the original wound then it is stable. Culture I did of the original IandD showed MRSA I contacted dialysis she is now on vancomycin with dialysis treatments. I asked them to run a month 10/9; patient seen in conjunction with HBO. She had a new spontaneous open area just above the wound on the right medial tibia ankle. More swelling on the right medial tibia. Her wound on the foot looks about the same perhaps slightly better. There is no warmth spreading up her leg but no obvious erythema. her MRI of the foot and ankle and distal tib-fib is not booked for next Friday I discussed this with her in great detail over multiple days. it is likely she has spreading infection upper leg at least involving the distal 25% above the ankle. She knows that if I refer her to orthopedics for infectious disease they are going to recommend amputation and indeed I am not against this myself. We had a good trial at trying to heal the foot which is what she wanted along with  antibiotics debridement and HBO however she clearly has spreading infection [probably staph aureus/MRSA]. Nevertheless she once again tells me she wants to wait the left of the MRI. She still makes comments about having her amputation done in [place]. 10/19; arrives today with significant swelling on the lateral right leg. Last culture I did showed Klebsiella. Multidrug- resistant. Cipro was intermediate sensitivity and that is what I have her on pending her MRI which apparently is going to be done on Thursday this week although this seems to be moving back and forth. She is not systemically unwell. We are using silver alginate on her major wound area on the right medial foot and the draining areas on the right lateral lower leg 10/26; MRI showed extensive abscess in the anterior compartment of the right leg also widespread osteomyelitis involving osseous structures of the midfoot and portions of the hindfoot. Also suspicion for osteomyelitis anterior aspect of the distal medial malleolus. Culture I did of the purulence once again showed a multidrug-resistant Klebsiella. I have been in contact with nephrology late last week and she has been started on cefepime at dialysis to replace the vancomycin We sent a copy of her MRI report to Dr. Geroge Baseman in [place] who is an orthopedic surgeon. The patient takes great stock in his opinion on this. She says she will go to [place] to have her leg amputated if Dr. Geroge Baseman does not feel there is any salvage options. 11/2; she still is not talk to her orthopedic surgeon in [place]. Apparently he will call her at [phone] this afternoon. The quality of this is she has not allowed me to refer her anywhere. She has been told over and over  that she needs this amputated but has not agreed to be referred. She tells me her blood sugar was 600 last night but she has not been febrile. 11/9; she never did got a call from the orthopedic surgeon in [place]  therefore that is off the radar. We have arranged to get her see orthopedic surgery at [facility]. She still has a lot of draining purulence coming out of the new abscess in her right leg although that probably came from the osteomyelitis in her right foot and heel. Meanwhile the original wound on the right foot looks very healthy. Continued improvement. The issue is that the last MRI showed osteomyelitis in her right foot extensively she now has an abscess in the right anterior lower leg. There is nobody in [place] who will offer this woman anything but an amputation and to be honest that is probably what she needs. I think she still wants to talk about limb salvage although at this point I just do not see that. She has completed her vancomycin at dialysis which was for the original staph aureus she is still on cefepime for the more recent Klebsiella. She has had a long course of both of these antibiotics which should have benefited the osteomyelitis on the right foot as well as the abscess. 11/16; apparently [place] elective surgery is shut down because of COVID-19 pandemic. I have reached out to some contacts at [facility] to see if we can get her an orthopedic appointment there. I am concerned about continually leaving this but for the moment everything is static. In fact her original large wound on this foot is closing down. It is the abscess on the right anterior leg that continues to drain purulent serosanguineous material. She is not currently on any antibiotics however she had a prolonged course of vancomycin [1 month] as well as cefepime for a month 02/24/2019 on evaluation today patient appears to be doing better than the last time I saw her. This is not a patient that I typically see. With that being said I am covering for Dr. Dellia Nims this week and again compared to when I last saw her overall the wounds in particular seem to be doing significantly better which is good news. With that  being said the patient tells me several disconcerting things. She has not been able to get in to see anyone for potential debridement in regard to her leg wounds although she tells me that she does not think it is necessary any longer because she is taking care of that herself. She noticed a string coming out of the lower wound on her leg over the last week. The patient states that she subsequently decided that we must of pack something in there and started pulling the string out and as it kept coming and coming she realized this was likely her tendon. With that being said she continued to remove as much of this as she could. She then I subsequently proceeded to using tubes of antibiotic ointment which she will stick down into the wound and then scored as much as she can until she sees it coming out of the other wound opening. She states that in doing this she is actually made things better and there is less redness and irritation. With regard to her foot wound she does have some necrotic tendon and tissue noted in one small corner but again the actual wound itself seems to be doing better with good granulation in general compared to my last evaluation.  12/7; continued improvement in the wound on the substantial part of the right medial foot. Still a necrotic area inferiorly that required debridement but the rest of this looks very healthy and is contracting. She has 2 wounds on the right lateral leg which were her original drainage sites from her abscess but all of this looks a lot better as well. She has been using silver alginate after putting antibiotic biotic ointment in one wound and watching it come out the other. I have talked to her in some detail today. I had given her names of orthopedic surgeons at [facility] for second opinion on what to do about the right leg. I do not think the patient never called them. She has not been able to get a hold of the orthopedic surgeon in [place] that  she had put a lot of faith in as being somebody would give her an opinion that she would trust. I talked to her today and said even if I could get her in to another orthopedic surgeon about the leg which she accept an amputation and she said she would not therefore I am not going to press this issue for the moment 12/14; continued improvement in his substantial wound on the right medial foot. There is still a necrotic area inferiorly with tightly adherent necrotic debris which I have been working on debriding each time she is here. She does not have an orthopedic appointment. Since last time she was here I looked over her cultures which were essentially MRSA on the foot wound and gram- negative rods in the abscess on the anterior leg. 12/21; continued improvement in the area on the right medial foot. She is not up on this much and that is probably a good thing since I do not know it could support continuous ambulation. She has a small area on the right lateral leg which were remanence of the IandD's I did because of the abscess. I think she should probably have prophylactic antibiotics I am going to have to look this over to see if we can make an intelligent decision here. In the meantime her major wound is come down nicely. Necrotic area inferiorly is still there but looks a lot better 04/06/2019; she has had some improvement in the overall surface area on the right medial foot somewhat narrowedr both but somewhat longer. The areas on the right lateral leg which were initial IandD sites are superficial. Nothing is present on the right heel. We are using silver alginate to the wound areas 1/18; right medial foot somewhat smaller. Still a deep probing area in the most distal recess of the wound. She has nothing open on the right leg. She has a new wound on the plantar aspect of her left fourth toe which may have come from just pulling skin. The patient using Medihoney on the wound on her foot under  silver alginate. I cannot discourage her from this 2/1; 2-week follow-up using silver alginate on the right foot and her left fourth toe. The area on the right dorsal foot is contracted although there is still the deep area in the most distal part of the wound but still has some probing depth. No overt infection 2/15; 2-week follow-up. She continues to have improvement in the surface area on the dorsal right foot. Even the tunneling area from last time is almost closed. The area that was on the plantar part of her left fourth toe over the PIP is indeed closed 3/1; 2-week follow-up. Continued  improvement in surface area. The original divot that we have been debriding inferiorly I think has full epithelialization although the epithelialization is gone down into the wound with probably 4 mm of depth. Even under intense illumination I am unable to see anything open here. The remanence of the wound in this area actually look quite healthy. We have been using silver alginate 3/15; 2-week follow-up. Unfortunately not as good today. She has a comma shaped wound on the dorsal foot however the upper part of this is larger. Under illumination debris on the surface She also tells [place] that she was on her right leg 2 times in the last couple of weeks mostly to reach up for things above her head etc. She felt a sharp pain in the right leg which she thinks is somewhere from the ankle to the knee. The patient has neuropathy and is really uncertain. She cannot feel her foot so she does not think it was coming from there 3/29; 2-week follow-up. Her wound measures smaller. Surface of the wound appears reasonable. She is using silver alginate with underlying Medihoney. She has home health. X-rays I did of her tib-fib last time were negative although it did show arterial calcification 4/12; 2-week follow-up. Her wound measures smaller in length. Using manuka honey with silver alginate on top. She has home  health. 4/26; 2-week follow-up. Her wound is smaller but still very adherent debris under illumination requiring debridement she has been using manuka honey with silver alginate. She has home health Objective Constitutional Vitals Time Taken: 12:51 PM, Height: 67 in, Weight: 125 lbs, BMI: 19.6, Temperature: 97.5 F, Pulse: 96 bpm, Respiratory Rate: 18 breaths/min, Blood Pressure: 143/83 mmHg, Capillary Blood Glucose: 250 mg/dl. General Notes: patient stated last CBG was 250, pump was not working correctly. Cardiovascular Pedal pulses are palpable. General Notes: Wound exam; wound measures smaller however under illumination a very fibrinous surface. Using a #5 curette the wound was debrided very gritty material hemostasis with direct pressure. There is no evidence of surrounding infection Integumentary (Hair, Skin) Wound #43 status is Open. Original cause of wound was Gradually Appeared. The wound is located on the Right,Medial Foot. The wound measures 3.5cm length x 1.5cm width x 0.1cm depth; 4.123cm^2 area and 0.412cm^3 volume. There is Fat Layer (Subcutaneous Tissue) Exposed exposed. There is no tunneling or undermining noted. There is a medium amount of serosanguineous drainage noted. The wound margin is flat and intact. There is medium (34-66%) pink, pale granulation within the wound bed. There is a medium (34-66%) amount of necrotic tissue within the wound bed including Adherent Slough. Assessment Active Problems ICD-10 Other chronic osteomyelitis, right ankle and foot Non-pressure chronic ulcer of other part of right foot with necrosis of bone Type 1 diabetes mellitus with foot ulcer Procedures Wound #43 Pre-procedure diagnosis of Wound #43 is a Diabetic Wound/Ulcer of the Lower Extremity located on the Right,Medial Foot .Severity of Tissue Pre Debridement is: Fat layer exposed. There was a Excisional Skin/Subcutaneous Tissue Debridement with a total area of 5.25 sq cm performed  by Ricard Dillon., MD. With the following instrument(s): Curette to remove Viable and Non-Viable tissue/material. Material removed includes Subcutaneous Tissue and Slough and after achieving pain control using Other (Benzocaine 20%). No specimens were taken. A time out was conducted at 13:03, prior to the start of the procedure. A Minimum amount of bleeding was controlled with Pressure. The procedure was tolerated well with a pain level of 0 throughout and a pain level of 0 following  the procedure. Post Debridement Measurements: 3.5cm length x 1.5cm width x 0.1cm depth; 0.412cm^3 volume. Character of Wound/Ulcer Post Debridement is improved. Severity of Tissue Post Debridement is: Fat layer exposed. Post procedure Diagnosis Wound #43: Same as Pre-Procedure Plan Follow-up Appointments: Return Appointment in 2 weeks. Dressing Change Frequency: Wound #43 Right,Medial Foot: Change dressing three times week. Wound Cleansing: Clean wound with Wound Cleanser - Anasept to all wounds Primary Wound Dressing: Wound #43 Right,Medial Foot: Hydrofera Blue - Classic - moistened with saline Secondary Dressing: Wound #43 Right,Medial Foot: Kerlix/Rolled [name] Dry [name] Edema Control: Elevate legs to the level of the heart or above for 30 minutes daily and/or when sitting, a frequency of: - throughout the day Home Health: [place] skilled nursing for wound care. - Interim 1. I changed her back to [facility] to see if we can get any additional debridement here. 2. There is no evidence of surrounding infection. Electronic Signature(s) Signed: 07/27/2019 5:40:04 PM By: Linton Ham MD Entered By: Linton Ham on 07/27/2019 13:30:14 -------------------------------------------------------------------------------- SuperBill Details Patient Name: Date of Service: Leda [name] 07/27/2019 Medical Record Number:[id number] Patient Account Number: [id number] Date of  Birth/Sex: Treating RN: 29-Jun-1970 (49 y.o. Nancy Fetter Primary Care Provider: [name] Other Clinician: Referring Provider: Treating Provider/Extender:Jarelis Ehlert, [name], [name] in Treatment: 46 Diagnosis Coding ICD-10 Codes Code Description [phone] Other chronic osteomyelitis, right ankle and foot L97.514 Non-pressure chronic ulcer of other part of right foot with necrosis of bone E10.621 Type 1 diabetes mellitus with foot ulcer Facility Procedures CPT4 Code Description: 83291916 11042 - DEB SUBQ TISSUE 20 SQ CM/< ICD-10 Diagnosis Description L97.514 Non-pressure chronic ulcer of other part of right foot with n Modifier: ecrosis of bone Quantity: 1 Physician Procedures CPT4 Code Description: 6060045 99774 - WC PHYS SUBQ TISS 20 SQ CM ICD-10 Diagnosis Description L97.514 Non-pressure chronic ulcer of other part of right foot with Modifier: necrosis of bon Quantity: 1 e Electronic Signature(s) Signed: 07/27/2019 5:40:04 PM By: Linton Ham MD Entered By: Linton Ham on 07/27/2019 13:30:33

## 2019-07-28 NOTE — Telephone Encounter (Signed)
Called pt and informed her of MD message. Pt verbalized understanding. 

## 2019-07-28 NOTE — Progress Notes (Signed)
[name], [name] ([id number]) Visit Report for 07/27/2019 Arrival Information Details Patient Name: Date of Service: [name], [name] 07/27/2019 12:45 PM Medical Record Number:[id number] Patient Account Number: [id number] Date of Birth/Sex: Treating RN: [birth year] (49 y.o. [name], [name] Primary Care [name]: [name] Other Clinician: Referring [name]: Treating [name]/Extender:[name], [name], [name]: 35 Visit Information History Since Last Visit Added or deleted any medications: No Patient Arrived: Wheel Any new allergies or adverse reactions: No Chair Had a fall or experienced change in No Arrival Time: 12:51 activities of daily living that may affect Accompanied By: self risk of falls: Transfer Assistance: None Signs or symptoms of abuse/neglect since last visito No Patient Identification Verified: Yes Hospitalized since last visit: No Secondary Verification Process Completed: Yes Implantable device outside of the clinic excluding No Patient Requires Transmission-Based No cellular tissue based products placed in the center Precautions: since last visit: Patient Has Alerts: No Has Dressing in Place as Prescribed: Yes Pain Present Now: No Electronic Signature(s) Signed: 07/28/2019 5:26:58 PM By: [name] Entered By: [name] on 07/27/2019 12:51:26 -------------------------------------------------------------------------------- Encounter Discharge Information Details Patient Name: Date of Service: [name] 07/27/2019 12:45 PM Medical Record Number:[id number] Patient Account Number: [id number] Date of Birth/Sex: Treating RN: [name] (49 y.o. [name] Primary Care Kayston Jodoin: [name] Other Clinician: Referring May Manrique: Treating [name]/Extender:[name], [name], [name]: 14 Encounter Discharge Information Items Post Procedure Vitals Discharge  Condition: Stable Temperature (F): 97.5 Ambulatory Status: Wheelchair Pulse (bpm): 96 Discharge Destination: Home Respiratory Rate (breaths/min): 18 Transportation: Private Auto Blood Pressure (mmHg): 143/83 Accompanied By: self Schedule Follow-up Appointment: Yes Clinical Summary of Care: Electronic Signature(s) Signed: 07/27/2019 5:14:33 PM By: Deon Pilling Entered By: Deon Pilling on 07/27/2019 13:24:11 -------------------------------------------------------------------------------- Lower Extremity Assessment Details Patient Name: Date of Service: [name] 07/27/2019 12:45 PM Medical Record Number:[id number] Patient Account Number: [id number] Date of Birth/Sex: Treating RN: [birth year] (49 y.o. [name] Primary Care [name]: [name] Other Clinician: Referring Ahnyla Mendel: Treating Cece Milhouse/Extender:[name], [name], [name]: 46 Edema Assessment Assessed: [Left: No] [Right: No] Edema: [Left: N] [Right: o] Calf Left: Right: Point of Measurement: cm From Medial Instep cm 28 cm Ankle Left: Right: Point of Measurement: cm From Medial Instep cm 18.5 cm Vascular Assessment Pulses: Dorsalis Pedis Palpable: [Right:Yes] Electronic Signature(s) Signed: 07/28/2019 5:26:58 PM By: [name] Entered By: [name] on 07/27/2019 12:53:23 -------------------------------------------------------------------------------- Multi Wound Chart Details Patient Name: Date of Service: [name] 07/27/2019 12:45 PM Medical Record Number:[id number] Patient Account Number: [id number] Date of Birth/Sex: Treating RN: [birth year] (49 y.o. [name] Primary Care [name]: [name] Other Clinician: Referring [name]: Treating [name]/Extender:[name], [name], [name]: 44 Vital Signs Height(in): 67 Capillary Blood 250 Weight(lbs): 125 Glucose(mg/dl): Body Mass Index(BMI):  20 Pulse(bpm): 96 Temperature(F): 97.5 Blood Pressure(mmHg):143/83 Respiratory 18 Rate(breaths/min): Photos: [43:No Photos] [N/A:N/A] Wound Location: [43:Right, Medial Foot] [N/A:N/A] Wounding Event: [43:Gradually Appeared] [N/A:N/A] Primary Etiology: [43:Diabetic Wound/Ulcer of the N/A Lower Extremity] Comorbid History: [43:Cataracts, Chronic sinus N/A problems/congestion, Anemia, Sleep Apnea, Deep Vein Thrombosis, Hypertension, Peripheral Arterial Disease, Type I Diabetes, Osteoarthritis, Osteomyelitis, Neuropathy, Seizure Disorder] Date Acquired: [43:08/04/2018] [N/A:N/A] Weeks of [name]: [43:46] [N/A:N/A] Wound Status: [43:Open] [N/A:N/A] Clustered Wound: [43:Yes] [N/A:N/A] Clustered Quantity: [43:1] [N/A:N/A] Measurements L x W x D 3.5x1.5x0.1 [N/A:N/A] (cm) Area (cm) : [43:4.123] [N/A:N/A] Volume (cm) : [43:0.412] [N/A:N/A] % Reduction in Area: [43:94.30%] [N/A:N/A] % Reduction in Volume: 98.10% [N/A:N/A] Classification: [43:Grade 4] [N/A:N/A] Exudate Amount: [43:Medium] [N/A:N/A] Exudate Type: [43:Serosanguineous] [  N/A:N/A] Exudate Color: [43:red, brown] [N/A:N/A] Wound Margin: [43:Flat and Intact] [N/A:N/A] Granulation Amount: [43:Medium (34-66%)] [N/A:N/A] Granulation Quality: [43:Pink, Pale] [N/A:N/A] Necrotic Amount: [43:Medium (34-66%)] [N/A:N/A] Exposed Structures: [43:Fat Layer (Subcutaneous N/A Tissue) Exposed: Yes Fascia: No Tendon: No Muscle: No Joint: No Bone: No] Epithelialization: [43:Medium (34-66%)] [N/A:N/A] Debridement: [43:Debridement - Excisional] [N/A:N/A] Pre-procedure [43:13:03] [N/A:N/A] Verification/Time Out Taken: Pain Control: [43:Other] [N/A:N/A] Tissue Debrided: [43:Subcutaneous, Slough] [N/A:N/A] Level: [43:Skin/Subcutaneous Tissue] [N/A:N/A] Debridement Area (sq cm):5.25 [N/A:N/A] Instrument: [43:Curette] [N/A:N/A] Bleeding: [43:Minimum] [N/A:N/A] Hemostasis Achieved: [43:Pressure] [N/A:N/A] Procedural Pain: [43:0] [N/A:N/A] Post  Procedural Pain: [43:0] [N/A:N/A] Debridement [name] Procedure was tolerated [N/A:N/A] Response: [43:well] Post Debridement [43:3.5x1.5x0.1] [N/A:N/A] Measurements L x W x D (cm) Post Debridement [43:0.412] [N/A:N/A] Volume: (cm) Procedures Performed: Debridement [N/A:N/A] [name] Notes Wound #43 (Right, Medial Foot) 1. Cleanse With Wound Cleanser 3. Primary Dressing Applied Hydrofera Blue 4. Secondary Dressing Dry [name] 6. Support Layer Psychologist, forensic Notes primary dressing hydrofera blue classic moisten with saline. ace wrap patient request. Electronic Signature(s) Signed: 07/27/2019 5:33:33 PM By: [name] RN, BSN Signed: 07/27/2019 5:40:04 PM By: Linton Ham MD Entered By: Linton Ham on 07/27/2019 13:27:40 -------------------------------------------------------------------------------- Multi-Disciplinary Care Plan Details Patient Name: Date of Service: [name] 07/27/2019 12:45 PM Medical Record Number:[id number] Patient Account Number: [id number] Date of Birth/Sex: Treating RN: [birth year] (49 y.o. [name] Primary Care Rorey Hodges: [name] Other Clinician: Referring Le Faulcon: Treating Vivian Neuwirth/Extender:[name], [name], [name]: 61 Active Inactive Wound/Skin Impairment Nursing Diagnoses: Impaired tissue integrity Knowledge deficit related to ulceration/compromised skin integrity Goals: Patient/caregiver will verbalize understanding of skin care regimen Date Initiated: 09/03/2018 Target Resolution Date: 08/28/2019 Goal Status: Active Ulcer/skin breakdown will have a volume reduction of 30% by week 4 Date Initiated: 09/03/2018 Date Inactivated: 10/07/2018 Target Resolution Date: 10/01/2018 Unmet Goal Status: Unmet Goal Status: Unmet Reason: COMORBITIES Ulcer/skin breakdown will have a volume reduction of 50% by week 8 Date Initiated: 10/07/2018 Date Inactivated: 11/03/2018 Target Resolution Date:  10/31/2018 Unmet Goal Status: Unmet Reason: Osteomyelitis Interventions: Assess patient/caregiver ability to obtain necessary supplies Assess patient/caregiver ability to perform ulcer/skin care regimen upon admission and as needed Assess ulceration(s) every visit Provide education on ulcer and skin care [name] Activities: Skin care regimen initiated : 09/03/2018 Topical wound management initiated : 09/03/2018 Notes: Electronic Signature(s) Signed: 07/27/2019 5:33:33 PM By: [name] RN, BSN Entered By: [name] on 07/27/2019 12:57:55 -------------------------------------------------------------------------------- Pain Assessment Details Patient Name: Date of Service: [name] 07/27/2019 12:45 PM Medical Record Number:[id number] Patient Account Number: [id number] Date of Birth/Sex: Treating RN: [birth year] [name] (49 y.o. [name] Primary Care Lazarus Sudbury: [name] Other Clinician: Referring Alfie Rideaux: Treating Shelley Cocke/Extender:[name], [name], [name]: 96 Active Problems Location of Pain Severity and Description of Pain Patient Has Paino No Site Locations Pain Management and Medication Current Pain Management: Electronic Signature(s) Signed: 07/28/2019 5:26:58 PM By: [name] Entered By: [name] on 07/27/2019 12:53:16 -------------------------------------------------------------------------------- Patient/Caregiver Education Details Patient Name: Date of Service: [name] 4/26/2021andnbsp12:45 PM Medical Record Number:[id number] Patient Account Number: [id number] Date of Birth/Gender: [birth year] [name] (48 y.o. F) Treating RN: [name] Primary Care Physician: [name] Other Clinician: Referring Physician: Treating Physician/Extender:[name], [name], [name]: 56 Education Assessment Education Provided To: Patient Education Topics Provided Wound/Skin  Impairment: Methods: Explain/Verbal Responses: State content correctly Electronic Signature(s) Signed: 07/27/2019 5:33:33 PM By: [name] RN, BSN Entered By: [name] on 07/27/2019 12:58:08 -------------------------------------------------------------------------------- Wound Assessment Details Patient Name: Date of Service: [name] 07/27/2019 12:45 PM Medical  Record Number:[id number] Patient Account Number: [id number] Date of Birth/Sex: Treating RN: [name] (49 y.o. [name] Primary Care Cadience Bradfield: [name] Other Clinician: Referring Delynn Pursley: Treating Khaniya Tenaglia/Extender:[name], [name], [name]: 46 Wound Status Wound Number: 43 Primary Diabetic Wound/Ulcer of the Lower Extremity Wound Location: Right, Medial Foot Etiology: Wounding Event: Gradually Appeared Wound Open Date Acquired: 08/04/2018 Status: Weeks Of [name]:46 ComorbidCataracts, Chronic sinus problems/congestion, Clustered Wound: Yes History: Anemia, Sleep Apnea, Deep Vein Thrombosis, Hypertension, Peripheral Arterial Disease, Type I Diabetes, Osteoarthritis, Osteomyelitis, Neuropathy, Seizure Disorder Photos Photo Uploaded By: [name] on 07/28/2019 14:05:39 Wound Measurements Length: (cm) 3.5 Width: (cm) 1.5 Depth: (cm) 0.1 Clustered Quantity: 1 Area: (cm) 4.123 Volume: (cm) 0.412 Wound Description Classification: Grade 4 Wound Margin: Flat and Intact Exudate Amount: Medium Exudate Type: Serosanguineous Exudate Color: red, brown Wound Bed Granulation Amount: Medium (34-66%) Granulation Quality: Pink, Pale Necrotic Amount: Medium (34-66%) Necrotic Quality: Adherent Slough After Cleansing: No rino Yes Exposed Structure osed: No (Subcutaneous Tissue) Exposed: Yes osed: No osed: No sed: No ed: No % Reduction in Area: 94.3% % Reduction in Volume: 98.1% Epithelialization: Medium (34-66%) Tunneling: No Undermining:  No Foul Odor Slough/Fib Fascia Exp Fat Layer Tendon Exp Muscle Exp Joint Expo Bone Expos [name] Notes Wound #43 (Right, Medial Foot) 1. Cleanse With Wound Cleanser 3. Primary Dressing Applied Hydrofera Blue 4. Secondary Dressing Dry [name] 6. Support Layer Psychologist, forensic Notes primary dressing hydrofera blue classic moisten with saline. ace wrap patient request. Electronic Signature(s) Signed: 07/28/2019 5:26:58 PM By: [name] Entered By: [name] on 07/27/2019 12:56:47 -------------------------------------------------------------------------------- Vitals Details Patient Name: Date of Service: [name] 07/27/2019 12:45 PM Medical Record Number:[id number] Patient Account Number: [id number] Date of Birth/Sex: Treating RN: [name] (49 y.o. [name] Primary Care Meiya Wisler: [name] Other Clinician: Referring Vernon Maish: Treating Briannon Boggio/Extender:[name], [name], [name]: 75 Vital Signs Time Taken: 12:51 Temperature (F): 97.5 Height (in): 67 Pulse (bpm): 96 Weight (lbs): 125 Respiratory Rate (breaths/min): 18 Body Mass Index (BMI): 19.6 Blood Pressure (mmHg): 143/83 Capillary Blood Glucose (mg/dl): 250 Reference Range: 80 - 120 mg / dl Notes patient stated last CBG was 250, pump was not working correctly. Electronic Signature(s) Signed: 07/28/2019 5:26:58 PM By: [name] Entered By: [name] on 07/27/2019 12:52:27

## 2019-07-28 NOTE — Telephone Encounter (Signed)
Please check to see if her blood sugars are improved, she was supposed to see you yesterday

## 2019-07-28 NOTE — Telephone Encounter (Signed)
Patient reports that she ate something last night after MN, and did not put all the carbs in.  Her blood sugar ran high in the 300s.  The pump brought her down to 150 this AM.   We discussed why her pump did not shut off when she dropped low.  It was not facing up when she went to bed, and could not read the sensor. Also she was sleeping on her stomach, which is where the sensor was placed.   We discussed the need to not lay on sensor and keep her pump facing upward while sleeping.  Also it helps to keep her phone close by to help with the signal from the [facility] get to the pump.  We were not able to meet at the hospital yesterday.  She told me she was at the door beside the Cancer center, but was at the other end of the hospital.  She can not come to the office for training, so we scheduled another visit on 5/10, after she has her next wound care visit.   She will call me if she has another doctor appointment that she can work me in sooner.

## 2019-07-28 NOTE — Telephone Encounter (Signed)
We can see her for a virtual visit on Thursday at 11 AM so we can review and make adjustments at that time

## 2019-07-28 NOTE — Telephone Encounter (Signed)
[name] can we can see her on Wednesday late morning if she is having any difficulties

## 2019-07-28 NOTE — Telephone Encounter (Signed)
Called pt to schedule her for a virtual visit. Pt stated that she could not do this Thursday because she is in dialysis. She also stated that the CDE gave her a lot of information that could help prevent issues like this again, and she would like try these first before being seen again.  Please advise.

## 2019-08-05 ENCOUNTER — Other Ambulatory Visit: Payer: Self-pay

## 2019-08-06 ENCOUNTER — Ambulatory Visit (INDEPENDENT_AMBULATORY_CARE_PROVIDER_SITE_OTHER): Payer: Medicare HMO | Admitting: Endocrinology

## 2019-08-06 ENCOUNTER — Encounter: Payer: Self-pay | Admitting: Endocrinology

## 2019-08-06 ENCOUNTER — Encounter: Payer: Medicare HMO | Attending: Endocrinology | Admitting: Nutrition

## 2019-08-06 VITALS — BP 110/68 | HR 80 | Ht 67.5 in | Wt 124.2 lb

## 2019-08-06 DIAGNOSIS — E1065 Type 1 diabetes mellitus with hyperglycemia: Secondary | ICD-10-CM | POA: Diagnosis not present

## 2019-08-06 DIAGNOSIS — E1051 Type 1 diabetes mellitus with diabetic peripheral angiopathy without gangrene: Secondary | ICD-10-CM

## 2019-08-06 NOTE — Progress Notes (Signed)
Patient ID: [name], female   DOB: Jun 20, 1970, 49 y.o.   MRN: [id number]   Reason for Appointment: follow-up of diabetes  History of Present Illness   Diagnosis: Type 1 DIABETES MELITUS  Previous history: She has had long-standing poorly controlled diabetes and typically has poor compliance with self care measures despite periodic diabetes education and periodic followup  INSULIN PUMP regimen:   Current insulin pump: T-insulin as of 07/20/2019   Basal rates: 0.3 around-the-clock  Previous settings: Midnight to 6 AM = 0.6, 3 AM = 0.5, 6 AM-9 AM: 0.55, 9 AM-1 PM = 0. 3, 1 PM-3 PM = 0.3, 3 PM- 7 PM = 0.75, 7 PM-12 AM = 0.85   Carb coverage 1: 20 g and correction 1: 70 Active insulin 4 hours CGM alerts: 80 and 200, rise and fall alerts are off         Recent history:   Her last A1c was 11.7  Current blood sugar patterns, difficulties with management and problems identified:   She has had some intermittent difficulty with her sensor and did not use it for a few days  Blood sugars are highly variable at all different times as discussed below  She is still quite afraid of hypoglycemia and because of some tendency to low sugars especially at dialysis she lowered her basal rates on her own down to 0.3 even though at times she was requiring as much as 0.75  She still does not feel sure about making setting changes in her pump settings  Blood sugars are usually higher when she is not on the sensor and the closed-loop  However in the last 3 days blood sugars have been reasonably good since the morning of 5/4  She does have some tendency to low sugars even with the 0.3 basal rate at 2-4 AM, midmorning and rarely 6-7 PM  Most of her high readings tend to be postprandial but can still get some high readings overnight   CONTINUOUS GLUCOSE MONITORING RECORD INTERPRETATION    Dates of Recording: Last 2 weeks  Sensor description: G6 Results statistics:   CGM  use % of time  68  Average and SD  221  Time in range       38%  % Time Above 180  59  % Time above 250   % Time Below target  3    PRE-MEAL  overnight  morning  afternoon  evening Overall  Glucose range:      42-400  Mean/median:  252  195  238  199  221+/-98     Glycemic patterns summary: Blood sugars are showing extreme fluctuation at all different times Highest readings are overall overnight and in the afternoons Lowest readings overall are late morning and evenings around 9-10 PM  Hyperglycemic episodes are occurring throughout the day and occasionally persistently high readings Also has postprandial hyperglycemia  Hypoglycemic episodes occurred sporadically at 3 AM, 10 AM but usually mild and transient, has some low normal readings around 6 PM and 10 PM also  Overnight periods: Blood sugars are highly variable and only occasionally tending to be low normal or low around 3 AM  Preprandial periods: Difficult to assess as she has variable mealtimes but are quite variable  Postprandial periods:   Blood sugars after meals are depending on her compliance with bolusing However with analyzing a few boluses she has blood sugars show variable response but no hypoglycemia postprandially Last night her bolus along with correction  dose improved her blood sugar to target 11    Meals: eating at variable times and also having snacks periodically.  Her daily carbohydrate intake is quite variable Physical activity: exercise: none         Dietician visit: Most recent:?          Complications: are: Neuropathy, nephropathy, diabetic foot ulcer, amputation    Wt Readings from Last 3 Encounters:  08/06/19 124 lb 3.2 oz (56.3 kg)  07/21/19 125 lb 12.8 oz (57.1 kg)  06/03/19 126 lb (57.2 kg)   Lab Results  Component Value Date   HGBA1C 11.7 (A) 07/21/2019   HGBA1C 9.3 (A) 02/04/2019   HGBA1C 11.4 (A) 10/15/2018   Lab Results  Component Value Date   MICROALBUR 304.2 (H) 11/11/2017     LDLCALC 78 05/02/2016   CREATININE 3.70 (H) 04/29/2019   No visits with results within 1 Week(s) from this visit.  Latest known visit with results is:  Office Visit on 07/21/2019  Component Date Value Ref Range Status  . Hemoglobin A1C 07/21/2019 11.7* 4.0 - 5.6 % Final    Other problems addressed:: See review of systems   Allergies as of 08/06/2019      Reactions   Cleocin [clindamycin Hcl] Diarrhea   Lisinopril Other (See Comments)   Elevated potassium per pt report   Amoxicillin Diarrhea, Nausea Only, Other (See Comments)   Did it involve swelling of the face/tongue/throat, SOB, or low BP? No Did it involve sudden or severe rash/hives, skin peeling, or any reaction on the inside of your mouth or nose? No Did you need to seek medical attention at a hospital or doctor's office? No When did it last happen?10 + years If all above answers are "NO", may proceed with cephalosporin use.   Bactrim [sulfamethoxazole-trimethoprim] Diarrhea, Nausea Only      Medication List       Accurate as of Aug 06, 2019  2:50 PM. If you have any questions, ask your nurse or doctor.        albuterol 108 (90 Base) MCG/ACT inhaler Commonly known as: VENTOLIN HFA Inhale 2 puffs into the lungs every 6 (six) hours as needed for wheezing or shortness of breath.   aspirin EC 81 MG tablet Take 81 mg by mouth daily.   B-12 5000 MCG Caps Take 5,000 mcg by mouth daily.   calcitRIOL 0.5 MCG capsule Commonly known as: ROCALTROL Take 1 capsule daily What changed:   how much to take  how to take this  when to take this  additional instructions   calcium acetate 667 MG capsule Commonly known as: PHOSLO Take 2 capsules (1,334 mg total) by mouth 3 (three) times daily with meals.   Contour Next Test test strip Generic drug: glucose blood USE AS INSTRUCTED TO CHECK BLOOD SUGAR 4 TIMES A DAY. DX E10.65   Dexcom G6 Receiver Devi 1 each by Does not apply route See admin instructions. Use  Dexcom Receiver to monitor blood sugar continuously.   Dexcom G6 Sensor Misc 1 each by Does not apply route See admin instructions. Use one sensors once every 10 days to monitor blood sugars.   Dexcom G6 Transmitter Misc 1 each by Does not apply route See admin instructions. Use one transmitter once every 90 days.   DIALYVITE 800 WITH ZINC 0.8 MG Tabs Take 1 tablet by mouth daily.   diphenhydrAMINE 25 MG tablet Commonly known as: BENADRYL Take 25 mg by mouth daily as needed.   folic  acid 400 MCG tablet Commonly known as: FOLVITE Take 400 mcg by mouth daily.   insulin aspart 100 UNIT/ML injection Commonly known as: NovoLOG USE A MAX OF 65 UNITS DAILY VIA INSULIN PUMP.   insulin pump Soln Inject into the skin. Insulin aspart (Novolog) 100 unit/ml---MAX 65 UNITS DAILY   norethindrone 5 MG tablet Commonly known as: AYGESTIN Take 5 mg by mouth See admin instructions.   Omega 3 500 500 MG Caps Take 500 mg by mouth daily.   ondansetron 4 MG tablet Commonly known as: ZOFRAN Take 4 mg by mouth every 8 (eight) hours as needed for nausea or vomiting.   oxyCODONE-acetaminophen 5-325 MG tablet Commonly known as: PERCOCET/ROXICET Take 1 tablet by mouth every 6 (six) hours as needed.   pravastatin 40 MG tablet Commonly known as: PRAVACHOL TAKE 1 TABLET BY MOUTH EVERY DAY   sulfamethoxazole-trimethoprim 800-160 MG tablet Commonly known as: BACTRIM DS Take 1 tablet by mouth daily.       Allergies:  Allergies  Allergen Reactions  . Cleocin [Clindamycin Hcl] Diarrhea  . Lisinopril Other (See Comments)    Elevated potassium per pt report  . Amoxicillin Diarrhea, Nausea Only and Other (See Comments)    Did it involve swelling of the face/tongue/throat, SOB, or low BP? No Did it involve sudden or severe rash/hives, skin peeling, or any reaction on the inside of your mouth or nose? No Did you need to seek medical attention at a hospital or doctor's office? No When did it last  happen?10 + years If all above answers are "NO", may proceed with cephalosporin use.    . Bactrim [Sulfamethoxazole-Trimethoprim] Diarrhea and Nausea Only    Past Medical History:  Diagnosis Date  . Anemia   . Arthritis   . Bronchitis   . C. difficile diarrhea 09/26/2014  . Cataracts, both eyes    had surgery to remove  . Cellulitis of right foot 06/02/2014  . CKD (chronic kidney disease) stage 3, GFR 30-59 ml/min    Dialysis T, TH, Sat  . Diabetic ulcer of right foot ([place]) 06/02/2014  . DVT (deep venous thrombosis) ([place]) 10/2014   one in each one in leg- PICC line , one in right and left arm  . H/O seasonal allergies   . Heart murmur    told once when she was pregnant- no furter mention- was in notes 11/2015- had Echo 8 /4/17  . History of blood transfusion   . Hypercholesteremia   . Hypertension   . Left foot infection   . Neuromuscular disorder ([place])    lower legs and feet  . Neuropathy    feet  . Neuropathy in diabetes ([place])   . Peripheral vascular disease ([place])   . Sleep apnea    does not use cpap  . Staphylococcus aureus bacteremia 10/2014  . Type 1 diabetes ([place])    onset age 29    Past Surgical History:  Procedure Laterality Date  . AMPUTATION TOE Left 07/24/2015   5th toe and Hallux amputation  . AV FISTULA PLACEMENT Left 08/06/2018   Procedure: INSERTION OF BASCILIC VEN TRANSPOSITION 1ST STAGE;  Surgeon: Rosetta Posner, MD;  Location: [place];  Service: Vascular;  Laterality: Left;  . BASCILIC VEIN TRANSPOSITION Left 04/29/2019   Procedure: SECOND STAGE BASCILIC VEIN TRANSPOSITION LEFT ARM;  Surgeon: Rosetta Posner, MD;  Location: [place];  Service: Vascular;  Laterality: Left;  . CESAREAN SECTION     x 1  . EXOSTECTECTOMY TOE Left  04/11/2016   Procedure: EXOSTECTECTOMY CUBOID AND 5TH METATARSAL AND PLACEMENT OF ANTIBIOTIC BEADS;  Surgeon: Edrick Kins, DPM;  Location: [place];  Service: Podiatry;  Laterality: Left;  . EYE SURGERY Bilateral    removed cataracts -  laser  . FEMORAL-POPLITEAL BYPASS GRAFT Left 05/03/2015  . FOOT AMPUTATION THROUGH METATARSAL Right 2016  . hemrrhoidectomy    . I & D EXTREMITY Right 06/10/2014   Procedure: IRRIGATION AND DEBRIDEMENT Right Foot;  Surgeon: Newt Minion, MD;  Location: [place];  Service: Orthopedics;  Laterality: Right;  . I & D EXTREMITY Right 07/31/2018   Procedure: IRRIGATION AND DEBRIDEMENT EXTREMITY;  Surgeon: Edrick Kins, DPM;  Location: [place];  Service: Podiatry;  Laterality: Right;  . I & D EXTREMITY Right 08/04/2018   Procedure: IRRIGATION AND DEBRIDEMENT FOOT with placement of wound vac;  Surgeon: Edrick Kins, DPM;  Location: [place];  Service: Podiatry;  Laterality: Right;  . INCISION AND DRAINAGE Left 04/11/2016   Procedure: INCISION AND DRAINAGE A DEEP COMPLICATED WOUND OF LEFT FOOT;  Surgeon: Edrick Kins, DPM;  Location: [place];  Service: Podiatry;  Laterality: Left;  . INSERTION OF DIALYSIS CATHETER Right 08/06/2018   Procedure: INSERTION OF TUNNELED DIALYSIS CATHETER;  Surgeon: Rosetta Posner, MD;  Location: [place];  Service: Vascular;  Laterality: Right;  . IR FLUORO GUIDE CV LINE RIGHT  09/24/2017  . IR FLUORO GUIDE CV LINE RIGHT  08/01/2018  . IR REMOVAL TUN CV CATH W/O FL  11/22/2017  . IR US GUIDE VASC ACCESS RIGHT  09/24/2017  . IR US GUIDE VASC ACCESS RIGHT  08/01/2018  . IRRIGATION AND DEBRIDEMENT ABSCESS Left 05/06/2017   Procedure: IRRIGATION AND DEBRIDEMENT ABSCESS LEFT FOOT;  Surgeon: Edrick Kins, DPM;  Location: [facility];  Service: Podiatry;  Laterality: Left;  . PERIPHERALLY INSERTED CENTRAL CATHETER INSERTION    . SKIN GRAFT Right 06/15/2014  . SKIN GRAFT Left 06/2015   foot   . SKIN SPLIT GRAFT Right 06/15/2014   Procedure: SPLIT THICKNESS SKIN GRAFT RIGHT FOOT;  Surgeon: Newt Minion, MD;  Location: [name];  Service: Orthopedics;  Laterality: Right;  . TRANSMETATARSAL AMPUTATION Right   . TRANSMETATARSAL AMPUTATION Right 09/11/2014  . WISDOM TOOTH EXTRACTION      Family History    Problem Relation Age of Onset  . Hyperlipidemia Mother   . Dementia Mother     Social History:  reports that she quit smoking about 11 years ago. She quit after 15.00 years of use. She has never used smokeless tobacco. She reports previous alcohol use. She reports that she does not use drugs.  Review of Systems:   Hypertension/CKD:   Is on dialysis  Currently not on any blood pressure medications  BP Readings from Last 3 Encounters:  08/06/19 110/68  07/21/19 140/74  06/03/19 (!) 169/87      Lipids: Has been  treated with pravastatin 40 mg  Followed by PCP   Lab Results  Component Value Date   CHOL 152 06/12/2018   HDL 24.40 (L) 06/12/2018   LDLCALC 78 05/02/2016   LDLDIRECT 82.0 06/12/2018   TRIG 222.0 (H) 06/12/2018   CHOLHDL 6 06/12/2018      Chronic anemia, followed by nephrologist and PCP:  Lab Results  Component Value Date   WBC 6.1 11/10/2018   HGB 11.2 (L) 04/29/2019   HCT 33.0 (L) 04/29/2019   MCV 73.9 (L) 11/10/2018   PLT 370 11/10/2018    She  has a Charcot foot, has had amputation on the left and currently still having incomplete healing     Examination:   BP 110/68 (BP Location: Right Arm, Patient Position: Sitting, Cuff Size: Normal)   Pulse 80   Ht 5' 7.5" (1.715 m)   Wt 124 lb 3.2 oz (56.3 kg)   SpO2 97%   BMI 19.17 kg/m   Body mass index is 19.17 kg/m.      ASSESSMENT/ PLAN:   Diabetes type 1 with poor control and multiple complications      See history of present illness for detailed discussion of current diabetes management, blood sugar patterns and problems identified  A1c is last 11.7  She has continued the closed-loop insulin pump with the T-Slim device  CGM and blood sugar patterns, bolus events were reviewed in detail  Blood sugars are variably controlled She has difficulty getting consistent results from her bed on basal programming Currently for now she is only using 0.3 basal rate throughout the day which is  lower than before However still not able to get a consistent idea of what her requirements are She is still very afraid of hypoglycemia and will overtreat low normal sugars and not bolus for meals She also has tendency to low readings at dialysis again and is not able to remember to use a temporary target when starting dialysis; she has various excuses about forgetting to do this   PLAN:     Carbohydrate ratio 1:20 to be continued  She needs to bolus for all meals consistently to help assess her insulin needs  We will reduce her basal rate down to 0.25 at 3 AM and 9 AM as well as at 3 PM to avoid the tendency to low normal sugars at those times  However since her sugars are usually higher early afternoon we will increase the basal rate to 0.35 at 1 PM  She will need to discuss alternative strategies to avoid low sugars with the nurse educator  May need to try and use the exercise mode on the mornings of dialysis and she can turn this on when she wakes up on those days until she comes back  Rotate infusion sites and change infusion sites regularly  Consider increasing correction factor from 1: 70 down to 1: 60  There are no Patient Instructions on file for this visit.      Elayne Snare 08/06/2019, 2:50 PM      Elayne Snare 08/06/19

## 2019-08-07 ENCOUNTER — Telehealth: Payer: Self-pay | Admitting: Endocrinology

## 2019-08-07 ENCOUNTER — Other Ambulatory Visit: Payer: Self-pay | Admitting: Endocrinology

## 2019-08-07 NOTE — Telephone Encounter (Signed)
Please let her know that after discussion with Vaughan Basta I recommend that she start on the exercise function on her pump on the mornings when she wakes up until the end of dialysis

## 2019-08-10 ENCOUNTER — Ambulatory Visit: Payer: Medicare HMO | Admitting: Endocrinology

## 2019-08-10 ENCOUNTER — Encounter (HOSPITAL_BASED_OUTPATIENT_CLINIC_OR_DEPARTMENT_OTHER): Payer: Medicare HMO | Admitting: Internal Medicine

## 2019-08-10 NOTE — Telephone Encounter (Signed)
Spoke with pt to let her know of this note. She was informed by Christean Grief earlier and is aware and understands the request.

## 2019-08-11 NOTE — Patient Instructions (Addendum)
Put pump in exercise mode before beginning dialysis.

## 2019-08-11 NOTE — Progress Notes (Signed)
We reviewed all the check list on training topics of this pump and she reported good understanding of them.  She made changes her her pump settings as instructed by Dr. Dwyane Dee with very little help from me.   She was shown how to put the pump in exercise mode before going to dialysis, and how to stop this,  and she re demonstrated how to do this correctly.   She signed the checklist as understanding all topics with no final questions.

## 2019-08-18 ENCOUNTER — Telehealth: Payer: Self-pay | Admitting: Endocrinology

## 2019-08-18 NOTE — Telephone Encounter (Signed)
Patient called to see if the Rx for gabapentin has been sent in

## 2019-08-18 NOTE — Telephone Encounter (Signed)
Medication Refill Request  Did you call your pharmacy and request this refill first? Yes  . If patient has not contacted pharmacy first, instruct them to do so for future refills.  . Remind them that contacting the pharmacy for their refill is the quickest method to get the refill.  . Refill policy also stated that it will take anywhere between 24-72 hours to receive the refill.    Name of medication? Gabapentin - dont see it on patient's chart   Is this a 90 day supply? 30 day  Name and location of pharmacy?  [id number] [address], [place] Phone:  [phone]  Fax:  [phone]

## 2019-08-18 NOTE — Telephone Encounter (Signed)
I do not see where I have prescribed it previously.  She needs to talk to her PCP

## 2019-08-20 ENCOUNTER — Telehealth: Payer: Self-pay | Admitting: Endocrinology

## 2019-08-20 ENCOUNTER — Other Ambulatory Visit: Payer: Self-pay

## 2019-08-20 MED ORDER — INSULIN ASPART 100 UNIT/ML ~~LOC~~ SOLN: SUBCUTANEOUS | 2 refills | Status: DC

## 2019-08-20 NOTE — Telephone Encounter (Signed)
Forwarding to [facility] for further investigation

## 2019-08-20 NOTE — Telephone Encounter (Signed)
Medication Refill Request  Did you call your pharmacy and request this refill first? Yes  . If patient has not contacted pharmacy first, instruct them to do so for future refills.  . Remind them that contacting the pharmacy for their refill is the quickest method to get the refill.  . Refill policy also stated that it will take anywhere between 24-72 hours to receive the refill.    Name of medication? novolog  Is this a 90 day supply? 30  Name and location of pharmacy?   [id number] [address], [place] Phone:  [phone]  Fax:  [phone]

## 2019-08-20 NOTE — Telephone Encounter (Signed)
Rx sent 

## 2019-08-24 NOTE — Telephone Encounter (Signed)
Spoke to pt told her Dr. Dwyane Dee has not ordered Gabapentin since 2018. Your last PCP ordered it last, you will need to ask your PCP for the medication. Pt said what if I need another medication that has not been ordered in awhile? Told her she will need to ask her PCP for it. Told her sorry and she then hung up.

## 2019-08-27 ENCOUNTER — Other Ambulatory Visit: Payer: Self-pay

## 2019-08-28 ENCOUNTER — Encounter (HOSPITAL_BASED_OUTPATIENT_CLINIC_OR_DEPARTMENT_OTHER): Payer: Medicare HMO | Attending: Internal Medicine | Admitting: Internal Medicine

## 2019-08-28 DIAGNOSIS — M86671 Other chronic osteomyelitis, right ankle and foot: Secondary | ICD-10-CM | POA: Diagnosis not present

## 2019-08-28 DIAGNOSIS — Z87891 Personal history of nicotine dependence: Secondary | ICD-10-CM | POA: Insufficient documentation

## 2019-08-28 DIAGNOSIS — Z9119 Patient's noncompliance with other medical treatment and regimen: Secondary | ICD-10-CM | POA: Insufficient documentation

## 2019-08-28 DIAGNOSIS — I129 Hypertensive chronic kidney disease with stage 1 through stage 4 chronic kidney disease, or unspecified chronic kidney disease: Secondary | ICD-10-CM | POA: Insufficient documentation

## 2019-08-28 DIAGNOSIS — L97521 Non-pressure chronic ulcer of other part of left foot limited to breakdown of skin: Secondary | ICD-10-CM | POA: Insufficient documentation

## 2019-08-28 DIAGNOSIS — E10621 Type 1 diabetes mellitus with foot ulcer: Secondary | ICD-10-CM | POA: Insufficient documentation

## 2019-08-28 DIAGNOSIS — E1022 Type 1 diabetes mellitus with diabetic chronic kidney disease: Secondary | ICD-10-CM | POA: Insufficient documentation

## 2019-08-28 DIAGNOSIS — Z86718 Personal history of other venous thrombosis and embolism: Secondary | ICD-10-CM | POA: Diagnosis not present

## 2019-08-28 DIAGNOSIS — L02415 Cutaneous abscess of right lower limb: Secondary | ICD-10-CM | POA: Diagnosis not present

## 2019-08-28 DIAGNOSIS — Z9641 Presence of insulin pump (external) (internal): Secondary | ICD-10-CM | POA: Diagnosis not present

## 2019-08-28 DIAGNOSIS — M8580 Other specified disorders of bone density and structure, unspecified site: Secondary | ICD-10-CM | POA: Diagnosis not present

## 2019-08-28 DIAGNOSIS — M1990 Unspecified osteoarthritis, unspecified site: Secondary | ICD-10-CM | POA: Diagnosis not present

## 2019-08-28 DIAGNOSIS — N179 Acute kidney failure, unspecified: Secondary | ICD-10-CM | POA: Diagnosis not present

## 2019-08-28 DIAGNOSIS — Z89512 Acquired absence of left leg below knee: Secondary | ICD-10-CM | POA: Insufficient documentation

## 2019-08-28 DIAGNOSIS — E1065 Type 1 diabetes mellitus with hyperglycemia: Secondary | ICD-10-CM | POA: Insufficient documentation

## 2019-08-28 DIAGNOSIS — G40909 Epilepsy, unspecified, not intractable, without status epilepticus: Secondary | ICD-10-CM | POA: Diagnosis not present

## 2019-08-28 DIAGNOSIS — L97812 Non-pressure chronic ulcer of other part of right lower leg with fat layer exposed: Secondary | ICD-10-CM | POA: Insufficient documentation

## 2019-08-28 DIAGNOSIS — L97514 Non-pressure chronic ulcer of other part of right foot with necrosis of bone: Secondary | ICD-10-CM | POA: Diagnosis not present

## 2019-08-28 DIAGNOSIS — E1042 Type 1 diabetes mellitus with diabetic polyneuropathy: Secondary | ICD-10-CM | POA: Diagnosis not present

## 2019-08-28 DIAGNOSIS — N189 Chronic kidney disease, unspecified: Secondary | ICD-10-CM | POA: Insufficient documentation

## 2019-08-28 NOTE — Progress Notes (Addendum)
Nancy, [name] ([id number]) Visit Report for 08/28/2019 Debridement Details Patient Name: Date of Service: Nancy LLO [name] 08/28/2019 3:30 PM Medical Record Number: [id number] Patient Account Number: [id number] Date of Birth/Sex: Treating RN: Apr 04, 1970 (49 y.o. F) [name], Nancy Primary Care Provider: Sanjuana [name], [name] Other Clinician: Referring Provider: Treating Provider/Extender: [name] MO REIRA, [name] Weeks in Treatment: 75 Debridement Performed for Assessment: Wound #43 Right,Medial Foot Performed By: Physician Nancy Bastos, MD Debridement Type: Debridement Severity of Tissue Pre Debridement: Fat layer exposed Level of Consciousness (Pre-procedure): Awake and Alert Pre-procedure Verification/Time Out Yes - 16:00 Taken: Start Time: 16:00 Pain Control: Other : benzocaine, 20% T Area Debrided (L x W): otal 2 (cm) x 2 (cm) = 4 (cm) Tissue and other material debrided: Viable, Non-Viable, Eschar, Subcutaneous Level: Skin/Subcutaneous Tissue Debridement Description: Excisional Instrument: Blade, Forceps Bleeding: None End Time: 16:01 Procedural Pain: 0 Post Procedural Pain: 0 Response to Treatment: Procedure was tolerated well Level of Consciousness (Post- Awake and Alert procedure): Post Debridement Measurements of Total Wound Length: (cm) 3.4 Width: (cm) 4.9 Depth: (cm) 0.1 Volume: (cm) 1.308 Character of Wound/Ulcer Post Debridement: Improved Severity of Tissue Post Debridement: Fat layer exposed Post Procedure Diagnosis Same as Pre-procedure Electronic Signature(s) Signed: 08/28/2019 4:30:26 PM By: Nancy Bastos MD, MBA Signed: 08/28/2019 4:59:31 PM By: [name] Entered By: [name] on 08/28/2019 16:03:12 -------------------------------------------------------------------------------- HPI Details Patient Name: Date of Service: Nancy LLO [name] 08/28/2019 3:30 PM Medical Record Number: [id number] Patient  Account Number: [id number] Date of Birth/Sex: Treating RN: [birth year] (49 y.o. [name] Primary Care Provider: Sanjuana [name], [name] Other Clinician: Referring Provider: Treating Provider/Extender: [name] MO REIRA, [name] Weeks in Treatment: 32 History of Present Illness HPI Description: 49 year old diabetic who is known to have type 1 diabetes which is poorly controlled last hemoglobin A1c was 11%. She comes in with a ulcerated area on the left lateral foot which has been there for over 6 months. Was recently she has been treated by Dr. Amalia [name] of podiatry who saw her last on 05/28/2016. Review of his notes revealed that the patient had incision and drainage with placement of antibiotic beads to the left foot on 04/11/2016 for possible osteomyelitis of the cuboid bone. Over the last year she's had a history of amputation of the left fifth toe and a femoropopliteal popliteal bypass graft somewhere in April 2017. 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 His note Dr. Amalia [name] mentions that the patient has been referred to me for further wound care and possibly great candidate for hyperbaric oxygen therapy due to recurrent osteomyelitis. However we do not have any x-rays of biopsy reports confirming this. He has been on several antibiotics including Bactrim and most recently is on doxycycline for an MRSA. I understand, the patient was not a candidate for IV antibiotics as she has had previous PICC lines which resulted in blood clots in both arms. There was a x-ray report dated 04/04/2016 on Dr. Amalia [name] notes which showed evidence of fifth ray resection left foot with osteolytic changes noted to the fourth metatarsal and cuboid bone on the left. 06/13/2016 -- had a left foot x-ray which showed no acute fracture or dislocation and no definite radiographic evidence of osteomyelitis. Advanced osteopenia was seen. 06/20/2016 -- she has noticed a new wound on the right plantar  foot in the region where she had a callus before. 06/27/16- the patient did have her x-ray of the  right foot which showed no findings to suggest osteomyelitis. She saw her endocrinologist, NancyKumar, yesterday. Her A1c in January was 11. He also indicates mismanagement and noncompliance regarding her diabetes. She is currently on Bactrim for a lip infection. She is complaining of nausea, vomiting and diarrhea. She is unable to articulate the exact orders or dosing of the Bactrim; it is unclear when she will complete this. 07/04/2016 -- results from [facility] of ABIs with ankle waveforms were noted from 02/14/2016. The examination done on 06/27/2015 showed noncompressible ABIs with the right being 1.45 and the left being 1.33. The present examination showed a right ABI of 1.19 on the left of 1.33. The conclusion was that right normal ABI in the lower extremity at rest however compared to previous study which was noncompressible ABI may be falsely elevated side suggesting medial calcification. The left ABI suggested medial calcification. 08/01/2016 -- the patient had more redness and pain on her right foot and did not get to come to see as noted she see her PCP or go to the ER and decided to take some leftover metronidazole which she had at home. As usual, the patient does report she feels and is rather noncompliant. 08/08/2016 -- -- x-ray of the right foot -- FINDINGS:Transmetatarsal amputation is noted. No bony destruction is noted to suggest osteomyelitis. IMPRESSION: No evidence of osteomyelitis. Postsurgical changes are seen. MRI would be more sensitive for possible bony changes. Culture has grown Serratia Marcescens -- sensitive to Bactrim, ciprofloxacin, ceftazidime she was seen by Dr. Daylene [name] on 08/06/2016. He did not find any exposed bone, muscle, tendon, ligament or joint. There was no malodor and he did a excisional debridement in the office. ============ Old notes: 49 year old  patient who is known to the wound clinic for a while had been away from the wound clinic since 09/01/2014. Over the last several months she has been admitted to various hospitals including [facility]. She was treated for a right metatarsal osteomyelitis with a transmetatarsal amputation and this was done about 2 months ago. He has a small ulcerated area on the right heel and she continues to have an ulcerated area on the left plantar aspect of the foot. The patient was recently admitted to the [facility] hospital group between 7/12 and 10/18/2014. she was given 3 weeks of IV vancomycin and was to follow-up with her surgeons at [facility] and also took oral vancomycin for C. difficile colitis. 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 MRI of the left foot done on 10/14/2014 showed no abscess or osteomyelitis. 04/27/15; this is a patient we know from previous stays in the wound care center. She is a type I diabetic I am not sure of her control currently. Since the last time I saw her she is had a right transmetatarsal amputation and has no wounds on her right foot and has no open wounds. She is been followed at the wound care center at [facility] in [place]. She comes today with the desire to undergo hyperbaric treatment locally. Apparently one of her wound care providers in [place] has suggested hyperbarics. This is in response to an MRI from 04/18/15 that showed increased marrow signal and loss of the proximal fifth metatarsal cortex evidence of osteomyelitis with likely early osteomyelitis in the  cuboid bone as well. She has a large wound over the base of the fifth metatarsal. She also has a eschar over  her the tips of her toes on 1,3 and 5. She does not have peripheral pulses and apparently is going for an angiogram tomorrow which seems reasonable. After this she is going to infectious disease at [facility]. They have been using Medihoney to the large wound on the lateral aspect of the left foot to. The patient has known Charcot deformity from diabetic neuropathy. She also has known diabetic PAD. Surprisingly I can't see that she has had any recent antibiotics, the patient states the last antibiotic she had was at the end of November for 10 days. I think this was in response to culture that showed group G strep although I'm not exactly sure where the culture was from. She is also had arterial studies on 03/29/15. This showed a right ABI of 1.4 that was noncompressible. Her left ABI was 0.73. 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 It was not felt that arterial inflow was adequate for healing of a foot ulcer. Her Doppler waveforms looked monophasic ===== READMISSION 02/28/17; this is in an now 49 year old woman we've had at several different occasions in this clinic. 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 She has a remote ex-smoker. She was last seen in this clinic by Dr. Con Memos I think in May. More recently she is been followed by her podiatrist Dr. Amalia [name] an infectious disease Dr. Megan Salon. She has 2 open wounds the major one is over the right first metatarsal head she also has a wound on the left plantar foot. an MRI of the right foot on 01/01/17 showed a soft tissue ulcer along the plantar aspect of the first metatarsal base consistent with osteomyelitis of the first metatarsal stump. Dr. 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 According to the patient this is been open for slightly over a month. She has been on a combination of Cipro 500 twice a day,  Zyvox 600 twice a day and Flagyl 500 3 times a day for over a month now as directed by Dr. Megan Salon. cultures of the right foot earlier this year showed MRSA in January and Serratia in May. January also had a few viridans strep. Recent x-rays of both feet were done and Dr. Amalia [name] office and I don't have these reports. The patient has known PAD and has a history of aleft femoropopliteal bypass in April 2017. 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 Dwyane Dee. In spite of this the last hemoglobin A1c I can see is 10.1 on 01/01/2017. She is being referred by Dr. 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 She is been using Medihoney to this area and also an area on the left midfoot. She is using healing sandals bilaterally. ABIs in this clinic at the left posterior tibial was 1.1 noncompressible on the right READMISSION Non invasive vascular NOVANT 5/18 Aftercare following surgery of the circulatory system Procedure Note - Interface, External Ris In - 08/13/2016 11:05 AM EDT Procedure: Examination consists of physiologic resting arterial pressures of the brachial and ankle arteries bilaterally with continuous wave Doppler waveform  analysis. Previous: Previous exam performed on 02/14/16 demonstrated ABIs of Rt = 1.19 and Lt = 1.33. Right: ABI = non-compressible PT 1.47 DP. S/P transmet amputation. , Left: ABI = 1.52, 2nd digit pressure = 87 mmHg Conclusions: Right: ABI (>1.3) may be falsely elevated, suggesting medial calcification. Left: ABI (>1.3) may be falsely elevated, suggesting medial calcification The patient is a now 49 year old type I diabetic is had multiple issues her graded to chronic diabetic foot ulcers. She has had a previous right  transmetatarsal amputation fifth ray amputation. She had Charcot feet diabetic polyneuropathy. We had her in the clinic lastin November. At that point she had wounds on her bilateral feet.she had wanted to try hyperbarics however the healogics review process denied her because she hadn't followed up with her vascular surgeon for her left femoropopliteal bypass. The bypass was done by Dr. Raul Del at [facility]. We made her a follow-up with Dr. Raul Del however she did not keep the appointment and therefore she was not approved The patient shows me a small wound on her left fourth metatarsal head on her phone. She developed rapid discoloration in the plantar aspect of the left foot and she was admitted to hospital from 2/2 through 05/10/17 with wet gangrene of the left foot osteomyelitis of the fourth metatarsal heads. She was admitted acutely ill with a temperature of 103. She was started on broad-spectrum vancomycin and cefepime. On 05/06/17 she was taken to the OR by Dr. Amalia [name] her podiatric surgeon for an incision and drainage irrigation of the left foot wound. Cultures from this surgery revealed group be strep and anaerobes. she was seen by NancyXu of orthopedic surgery and scheduled for a below-knee amputation which she u refused. Ultimately she was discharged on Levaquin and Flagyl for one month. MRI 05/05/17 done while she was in the hospital showed abscess adjacent to the fourth metatarsal head and neck small abscess around the fourth flexor tendon. Inflammatory phlegmon and gas in the soft tissues along the lateral aspect of the fourth phalanx. Findings worrisome for osteomyelitis involving the fourth proximal and middle phalanx and also the third and fourth metatarsals. Finally the patient had actually shortly before this followed up with Dr. Raul Del at no time on 04/29/17. He felt that her left femoropopliteal bypass was patent he felt that her left-sided toe pressures more than adequate for  healing a wound on the left foot. This was before her acute presentation. 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 05/28/17; she is started hyperbarics. The patient tells me that for some reason she was not actually on Levaquin but I think on ciprofloxacin. She was on Flagyl. She only started her Levaquin yesterday due to some difficulty with the pharmacy and perhaps her sister picking it up. She has an appointment with Dr. Amalia [name] tomorrow and with infectious disease early next week. She has no new complaints 06/06/17; the patient continues in hyperbarics. She saw Dr. Amalia [name] on 05/29/17 who is her podiatric surgeon. He is elected for a transmetatarsal amputation on 06/27/17. I'm not sure at what level he plans to do this amputation. The patient is unaware She also saw Dr. Megan Salon of infectious disease who elected to continue her on current antibiotics I think this is ciprofloxacin and Flagyl. I'll need to clarify with her tomorrow if she actually has this. We're using silver alginate to the actual wound. Necrotic surface today with material under the flap of her foot. Original MRI showed abscesses as well as osteomyelitis of the proximal and middle fourth  phalanx and the third and fourth metatarsal heads 06/11/17; patient continues in hyperbarics and continues on oral antibiotics. She is doing well. The wound looks better. The necrotic part of this under the flap in her superior foot also looks better. she is been to see Dr. Amalia [name]. I haven't had a chance to look at his note. Apparently he has put the transmetatarsal amputation on hold her request it is still planning to take her to the OR for debridement and product application ACEL. I'll see if I can find his note. I'll therefore leave product ordering/requests to Dr. Amalia [name] for now. I was going to look at Dermagraft 06/18/17-she is here in follow-up evaluation for bilateral foot wounds. She continues with hyperbaric therapy. She states she has been  applying manuka honey to the right plantar foot and alternate manuka honey and silver alginate to the left foot, despite our orders. We will continue with same treatment plan and she will follo up next week. 06/25/17; I have reviewed Dr. Amalia [name] last note from 3/11. She has operative debridement in 2 days' time. By review his note apparently they're going to place there is skin over the majority of this wound which is a good choice. She has a small satellite area at the most proximal part of this wound on the left plantar foot. The area on the right plantar foot we've been using silver alginate and it is close to healing. 07/02/17; unfortunately the patient was not easily approved for Dr. Amalia [name] proposed surgery. I'm not completely certain what the issue is. She has been using silver alginate to the wound she has completed a first course of hyperbarics. She is still on Levaquin and Flagyl. I have really lost track of the time course here.I suspect she should have another week to 2 of antibiotics. I'll need to see if she is followed up with infectious disease Dr. Megan Salon 07/09/17; the patient is followed up with Dr. Megan Salon. 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 She continues on Levaquin and metronidazole continuing both of these for now I think she is been on fr about 6 weeks. She still has some drainage but no pain. No fever. Her had been plans for her to go to the OR for operative debridement with her podiatrist Dr. Amalia [name], I am not exactly sure where that is. I'll probably slip a note to Dr. Amalia [name] today. I note that she follows with Dr. Dwyane Dee of endocrinology. We have her recertified for hyperbaric oxygen. I have not heard about Dermagraft however I'll see if Dr. Amalia [name] is planning a skin substitute as well 07/16/17; the patient tells me she is just about out of [place]. I'll need to check Dr. Hale Bogus last notes on this. She states she has plenty of  Flagyl however. She comes in today complaining of pain in the right lateral foot which she said lasted for about a day. The wound on the right foot is actually much more medially. She also tells me that the [facility] cost a lot of pain in the left foot wound and she turned back to silver alginate. Finally Dermagraft has a $329 per application co-pay. She cannot afford this 07/23/17; patient arrives today with the wound not much smaller. There is not much new to add. She has not heard from Dr. Amalia [name] all try to put in a call to them today. She was asking about Dermagraft again and she has an over $924 per application  co-pay she states that she would be willing to try to do a payment plan. I been tried to avoid this. We've been using silver alginate, I'll change to [facility] 07/30/17-She is here in follow-up evaluation for left foot ulcer. She continues hyperbaric medicine. The left foot ulcer is stable we will continue with same treatment plan 08/06/17; she is here for evaluation of her left foot ulcer. Currently being treated for hyperbarics or underlying osteomyelitis. She is completed antibiotics. The left foot ulcer is better smaller with healthier looking granulation. For various reasons I am not really clear on we never got her back to the OR with Dr. Amalia [name]. He did not respond to my secure text message. Nevertheless I think that surgery on this point is not necessary nor am I completely clear that a skin substitute is necessary The patient is complaining about pain on the outside of her right foot. 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 There is no erythema. She also states the foot is warm versus her other part of her upper leg and this is largely true. It is not totally clear to me what's causing this. She thinks it's different from her usual neuropathy pain 08/13/17; she arrives in clinic today with a small wound which is superficial on her right first metatarsal head.  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 She tells [place] she was up on her feet over the Mother's Day celebration. The large wound is on the left foot. Continues with hyperbarics for underlying osteomyelitis. We're using Hydrofera Blue. She asked me today about where we were with Dermagraft. I had actually excluded this because of the co-pay however she wants to assume this therefore I'll recheck the co-pay an order for next week. 08/20/17; the patient agreed to accept the co-pay of the first Apligraf which we applied today. She is disappointed she is finishing hyperbarics will run this through the insurance on the extent of the foot infection and the extent of the wound that she had however she is already had 60 dive's. Dermagraft No. 1 08/27/17; Dermagraft No. 2. She is not eligible for any more hyperbaric treatments this month. She reports a fair amount of drainage and she actually changed to the external dressings without disturbing the direct contact layer 09/03/17; the patient arrived in clinic today with the wound superficially looking quite healthy. Nice vibrant red tissue with some advancing epithelialization although not as much adherence of the flap as I might like. However she noted on her own fourth toe some bogginess and she brought that to our attention. Indeed this was boggy feeling like a possibility of subcutaneous fluid. 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 She is not been unwell. We've been using Dermagraft 09/10/17; the culture that I did not last week was MRSA. She saw Dr. Megan Salon this morning who is going to start her on vancomycin. I had sent him a secure a text message yesterday. I also spoke with her podiatric surgeon Dr. Amalia [name] about surgery on this foot the options for conserving a functional foot etc. Promised me he would see her and will make back consultation today. Paradoxically  her actual wound on the plantar aspect of her left foot looks really quite good. I had given her 5 days worth of Baxdella to cover her for MRSA. Her MRI came back showing osteomyelitis within the third metatarsal shaft and head  and base of the third and fourth proximal phalanx. She had extensive inflammatory changes throughout the soft tissue of the lateral forefoot. With an ill-defined fluid around the fourth metatarsal extending into the plantar and dorsal soft tissues 09/19/17; the patient is actually on oral Septra and Flagyl. She apparently refused IV vancomycin. She also saw Dr. Amalia [name] at my request who is planning her for a left BKA sometime in mid July. MRI showed osteomyelitis within the third metatarsal shaft and head and the basis of the third and fourth proximal phalanx. I believe there was felt to be possible septic arthritis involving the third MTP. 09/26/17; the patient went back to Dr. Megan Salon at my suggestion and is now receiving IV daptomycin. Her wound continues to look quite good making the decision to proceed with a transmetatarsal amputation although more difficult for the patient. I believe in my extensive discussions with her she has a good sense of the pros and cons of this. I don't NV the tuft decision she has to make. She has an appointment with Dr. Amalia [name] I believe in mid July and I previously spoken to him about this issue Has we had used 3 previous Dermagraft. Given the condition of the wound surface I went ahead and added the fourth one today area and I did this not fully realizing that she'll be traveling to [place] next week. I'm hopeful she can come back in 2 weeks 10/21/17; Her same Dermagraft on for about 3-1/2 weeks. In spite of this the wound arrives looking quite healthy. There is been a lot of healing dimensions are smaller. Looking at the square shaped wound she has now there is some undermining and some depth medially under the undermining although I cannot  palpate any bone. No surrounding infection is obvious. She has difficult questions about how to look at this going forward vis--vis amputations versus continued medical therapy. T be truthful the wound is looks o so healthy and it is continued to contract. Hard to justify foot surgery at this point although I still told her that I think it might come to that if we are not able to eradicate the underlying MRSA. She is still highly at risk and she understands this 11/06/17 on evaluation today patient appears to be doing better in regard to her foot ulcer. She's been tolerating the dressing changes without complication. Currently she is here for her Dermagraft #6. Her wound continues to make excellent progress at this point. She does not appear to have any evidence of infection which is good news. 11/13/17 on evaluation today patient appears to be doing excellent at this time. She is here for repeat Dermagraft application. This is #7. Overall her wound seems to be making great progress. 12/05/17; the patient arrives with the wound in much better condition than when I last saw this almost 6 weeks ago. She still has a small probing area in the left metatarsal head region on the lateral aspect of her foot. We applied her last Dermagraft today. Since the last time she is here she has what appears to a been a blood blister on the plantar aspect of left foot although I don't see this is threatening. There is also a thick raised tissue on the right mid metatarsal head region. This was not there I don't think the last time she was here 3 weeks ago. 12/12/17; the patient continues to have a small programming area in the left metatarsal head region on the lateral aspect of her foot  which was the initial large surgical wound. I applied her last Apligraf last week. I'm going to use Endoform starting today Unfortunately she has an excoriated area in the left mid foot and the right mid foot. The left midfoot looks like a  blistered area this was not opened last week it certainly is open today. Using silver alginate on these areas. She promises me she is offloading this. 12/19/17; the small probing area in the left metatarsal head eyes think is shallower. In general her original wound looks better. We've been using Endoform. The area inferiorly that I think was trauma last week still requires debridement a lot of nonviable surface which I removed. She still has an open open area distally in her foot Similarly on the right foot there is tightly adherent surface debris which I removed. Still areas that don't look completely epithelialized. This is a small open area. We used silver alginate on these areas 12/26/2017; the patient did not have the supplies we ordered from last week including the Endoform. The original large wound on the left lateral foot looks healthy. She still has the undermining area that is largely unchanged from last week. She has the same heavily callused raised edged wounds on the right mid and left midfoot. Both of these requiring debridement. We have been using silver alginate on these areas 01/02/2018; there is still supply issues. We are going to try to use Prisma but I am not sure she actually got it from what she is saying. She has a new open area on the lateral aspect of the left fourth toe [previous fifth ray amputation]. Still the one tunneling area over the fourth metatarsal head. The area is in the midfoot bilaterally still have thick callus around them. She is concerned about a raised swelling on the lateral aspect of the foot. However she is completely insensate 01/10/2018; we are using Prisma to the wounds on her bilateral feet. Surprisingly the tunneling area over the left fourth metatarsal head that was part of her original surgery has closed down. She has a small open area remaining on the incision line. 2 open areas in the midfoot. 02/10/2018; the patient arrives back in clinic after a  month hiatus. She was traveling to visit family in [place]. Is fairly clear she was not offloading the areas on her feet. The original wound over the left lateral foot at the level of metatarsal heads is reopened and probes medially by about a centimeter or 2. She notes that a week ago she had purulent drainage come out of an area on the left midfoot. Paradoxically the worst area is actually on the right foot is extensive with purulent drainage. We will use silver alginate today 02/17/2018; the patient has 3 wounds one over the left lateral foot. She still has a small area over the metatarsal heads which is the remnant of her original surgical wound. This has medial probing depth of roughly 1.4 cm somewhat better than last week. The area on the right foot is larger. We have been using silver alginate to all areas. The area on the right foot and left foot that we cultured last week showed both Klebsiella and Proteus. Both of these are quinolone sensitive. The patient put her's self on Bactrim and Flagyl that she had left hanging around from prior antibiotic usages. She was apparently on this last week when she arrived. I did not realize this. Unfortunately the Bactrim will not cover either 1 of these organisms. We will  send in Cipro 500 twice daily for a week 03/04/2018; the patient has 2 wounds on the left foot one is the original wound which was a surgical wound for a deep DFU. At one point this had exposed bone. She still has an area over the fourth metatarsal head that probes about 1.4 cm although I think this is better than last week. I been using silver nitrate to try and promote tissue adherence and been using silver alginate here. She also has an area in the left midfoot. This has some depth but a small linear wound. Still requiring debridement. On the right midfoot is a circular wound. A lot of thick callus around this area. We have been using silver alginate to all wound areas She is  completed the ciprofloxacin I gave her 2 weeks ago. 03/11/2018; the patient continues to have 2 open areas on the left foot 1 of which was the original surgical wound for a deep DFU. Only a small probing area remains although this is not much different from last week we have been using silver alginate. The other area is on the midfoot this is smaller linear but still with some depth. We have been using silver alginate here as well On the right foot she has a small circular wound in the mid aspect. This is not much smaller than last time. We have been using silver alginate here as well 03/18/2018; she has 3 wounds on the left foot the original surgical wound, a very superficial wound in the mid aspect and then finally the area in the mid plantar foot. She arrives in today with a very concerning area in the wound in the mid plantar foot which is her most proximal wound. There is undermining here of roughly 1-1/2 cm superiorly. Serosanguineous drainage. She tells me she had some pain on for over the weekend that shot up her foot into her thigh and she tells me that she had a nodule in the groin area. She has the single wound in the right foot. We are using endoform to both wound areas 03/24/2018; the patient arrives with the original surgical wound in the area on the left midfoot about the same as last week. There is a collection of fluid under the surface of the skin extending from the surgical wound towards the midfoot although it does not reach the midfoot wound. The area on the right foot is about the same. Cultures from last week of the left midfoot wound showed abundant Klebsiella abundant Enterococcus faecalis and moderate methicillin resistant staph I gave her Levaquin but this would have only covered the Klebsiella. She will need linezolid 04/01/2018; she is taking linezolid but for the first few days only took 1 a day. I have advised her to finish this at twice daily dosing. In any case all of  her wounds are a lot better especially on the left foot. The original surgical wound is closed. The area on the left midfoot considerably smaller. The area on the right foot also smaller. 04/08/2018; her original surgical wound/osteomyelitis on the left foot remains closed. She has area on the left foot that is in the midfoot area but she had some streaking towards this. This is not connected with her original wound at least not visually. Small wound on the right midfoot appears somewhat smaller. 04/15/18; both wounds looks better. Original wound is better left midfoot. Using silver alginate 1/21; patient states she uses saltwater soak in, stones or remove callus from around her  wounds. She is also concerned about a blood blister she had on the left foot but it simply resolved on its own. We've been using silver alginate 1/28; the patient arrives today with the same streaking area from her metatarsals laterally [the site of her original surgical wound] down to the middle of her foot. There is some drainage in the subcutaneous area here. This concerns me that there is actually continued ongoing infection in the metatarsals probably the fourth and third. This fixates an MRI of the foot without contrast [chronic renal failure] The wound in the mid part of the foot is small but I wonder whether this area actually connects with the more distal foot. The area on the right midfoot is probably about the same. Callus thick skin around the small wound which I removed with a curette we have been using silver alginate on both wound areas 2/4; culture I did of the draining site on the left foot last time grew methicillin sensitive staph aureus. MRI of the left foot showed interval resolution of the findings surrounding the third metatarsal joint on the prior study consistent with treated osteomyelitis. Chronic soft tissue ulceration in the plantar and lateral aspect of the forefoot without residual focal fluid  collection. No evidence of recurrent osteomyelitis. 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 She tells me she had nausea and vomiting over the weekend and missed 2 days. In general her foot looks much the same. She has a small open area just below the left fourth metatarsal head. A linear area in the left midfoot. Some discoloration extending from the inferior part of this into the left lateral foot although this appears to be superficial. She has a small area on the right midfoot which generally looks smaller after debridement 2/18; the patient is completing his cephalexin and has another 2 days. She continues to have open areas on the left and right foot. 2/25; she is now off antibiotics. The area on the left foot at the site of her original surgical wound has closed yet again. She still has open areas in the mid part of her foot however these appear smaller. The area on the right mid foot looks about the same. We have been using silver alginate She tells me she had a serious hypoglycemic spell at home. She had to have EMS called and get IV dextrose 3/3; disappointing on the left lateral foot large area of necrotic tissue surrounding the linear area. This appears to track up towards the same original surgical wound. Required extensive debridement. The area on the right plantar foot is not a lot better also using silver 3/12; the culture I did last time showed abundant enterococcus. I have prescribed Augmentin, should cover any unrecognized anaerobes as well. In addition there were a few MRSA and Serratia that would not be well covered although I did not want to give her multiple antibiotics. She comes in today with a new wound in the right midfoot this is  not connected with the original wound over her MTP a lot of thick callus tissue around both wounds but once again she said she is not walking on these areas 3/17-Patient comes in for follow-up on the bilateral plantar wounds, the right midfoot and the left  plantar wound. Both these are heavily callused surrounding the wounds. We are continuing to use silver alginate, she is compliant with offloading and states she uses a wheelchair fairly often at home 3/24; both wound areas have thick callus. However things actually look quite a bit better here for the majority of her left foot and the right foot. 3/31; patient continues to have thick callused somewhat irritated looking tissue around the wounds which individually are fairly superficial. There is no evidence of surrounding infection. We have been using silver alginate however I change that to [facility] today 4/17; patient returns to clinic after having a scare with Covid she tested negative in her primary doctor's office. She has been using Hydrofera Blue. She does not have an open area on the right foot. On the left foot she has a small open area with the mid area not completely viable. She showed me pictures of what looks like a hemorrhagic blister from several days ago but that seems to have healed over this was on the lateral left foot 4/21; patient comes in to clinic with both her wounds on her feet closed. However over the weekend she started having pain in her right foot and leg up into the thigh. She felt as though she was running a low-grade fever but did not take her temperature. She took a doxycycline that she had leftover and yesterday a single Septra and metronidazole. She thinks things feel somewhat better. 4/28; duplex ultrasound I ordered last week was negative for DVT or superficial thrombophlebitis. She is completed the doxycycline I gave her. States she is still having a lot of pain in the right calf and right ankle which is no  better than last week. She cannot sleep. She also states she has a temperature of up to 101, coughing and complaining of visual loss in her bilateral eyes. Apparently she was tested for Covid 2 weeks ago at [facility] and that was negative. Readmission: 09/03/18 patient presents back for reevaluation after having been evaluated at the end of April regarding erythema and swelling of her right lower extremity. Subsequently she ended up going to the hospital on 07/29/18 and was admitted not to be discharged until 08/08/18. Unfortunately it was noted during the time that she was in the hospital that she did have methicillin-resistant Staphylococcus aureus as the infection noted at the site. It was also determined that she did have osteomyelitis which appears to be fairly significant. She was treated with vancomycin and in fact is still on IV vancomycin at dialysis currently. This is actually slated to continue until 09/12/18 at least which will be the completion of the six weeks of therapy. Nonetheless based on what I'm seeing at this point I'm not sure she will be anywhere near ready to discontinue antibiotics at that time. Since she was released from the hospital she was seen by Dr. Amalia [name] who is her podiatrist on 08/27/18. His note specifically states that he is recommended that the patient needs of one knee amputation on the right as she has a life- threatening situation that can lead quickly to sepsis. The patient advised she would like to try to save her leg to which Dr. Amalia [name] apparently told her that this was against all medical advice. She also want to discontinue the Wound VAC which had been initiated due to the fact that she wasn't pleased with how the wound was looking and subsequently she wanted to pursue applying Medihoney at that time. He stated that he  did not believe that the right lower extremity was salvageable and that the patient understood but would still like to attempt hyperbaric option therapy  if it could be of any benefit. She was therefore referred back to [place] for further evaluation. He plans to see her back next week. Upon inspection today patient has a significant amount purulent drainage noted from the wound at this point. The bone in the distal portion of her foot also appears to be extremely necrotic and spongy. When I push down on the bone it bubbles and seeps purulent drainage from deeper in the end of the foot. I do not think that this is likely going to heal very well at all and less aggressive surgical debridement were undertaken more than what I believe we can likely do here in our office. 09/12/2018; I have not seen this patient since the most recent hospitalization although she was in our clinic last week. I have reviewed some of her records from a complex hospitalization. 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 There is situation was complicated by MRSA bacteremia and acute on chronic renal failure now on dialysis. She is receiving vancomycin at dialysis. We started her on Dakin's wet-to-dry last week she is changing this daily. There is still purulent drainage coming out of her foot. Although she is apparently "agreeable" to a below-knee amputation which is been suggested by multiple clinicians she wants this to be done in [place]. She apparently has a telehealth visit with that provider sometime in late [place] 6/24. I have told her I think this is probably too long. Nevertheless I could not convince her to allow a local doctor to perform BKA. 09/19/2018; the patient has a large necrotic area on the right anterior foot. She has had previous transmetatarsal amputations. Culture I did last week showed MRSA nothing else she is on vancomycin at dialysis. She has continued leaking purulent drainage out of the distal part of the large circular wound on the right anterior foot. She apparently went to see Dr. Berenice Primas of orthopedics  to discuss scheduling of her below-knee amputation. Somehow that translated into her being referred to plastic surgery for debridement of the area. I gather she basically refused amputation although I do not have a copy of Dr. Berenice Primas notes. The patient really wants to have a trial of hyperbaric oxygen. I agreed with initial assessment in this clinic that this was probably too far along to benefit however if she is going to have plastic surgery I think she would benefit from ancillary hyperbaric oxygen. The issue here is that the patient has benefited as maximally as any patient I have ever seen from hyperbaric oxygen therapy. Most recently she had exposed bone on the lateral part of her left foot after a surgical procedure and that actually has closed. She has eschared areas in both heels but no open area. She is remained systemically well. I am not optimistic that anything can be done about this but the patient is very clear that she wants an attempt. The attempt would include a wound VAC further debridements and hyperbaric oxygen along with IV antibiotics. 6/26; I put her in for a trial of hyperbaric oxygen only because of the dramatic response she has had with wounds on her left midfoot earlier this year which was a surgical wound that went straight to her bone over the metatarsal heads and also remotely the left third toe. We will see if we  can get this through our review process and insurance. She arrives in clinic with again purulent material pouring out of necrotic bone on the top of the foot distally. There is also some concerning erythema on the front of the leg that we marked. It is bit difficult to tell how tender this is because of neuropathy. I note from infectious disease that she had her vancomycin extended. 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 She had the wound VAC on for part of the week. The rest of the time she is putting various things on this  including Medihoney, "ionized water" silver sorb gel etc. 7/7; follow-up along with HBO. She is still on vancomycin at dialysis. She has a large open area on the dorsal right foot and a small dark eschar area on her heel. There is a lot less erythema in the area and a lot less tenderness. From an infection point of view I think this is better. She still has a lot of necrosis in the remaining right forefoot [previous TMA] we are still using the wound VAC in this area 7/16; follow-up along with HBO. I put her on linezolid after she finished her vancomycin. We started this last Friday I gave her 2 weeks worth. I had the expectation that she would be operatively debrided by Dr. Marla Roe but that still has not happened yet. Patient phoned the office this week. She arrives for review today after HBO. The distal part of this wound is completely necrotic. Nonviable pieces of tendon bone was still purulent drainage. Also concerning that she has black eschar over the heel that is expanding. I think this may be indicative of infection in this area as well. She has less erythema and warmth in the ankle and calf but still an abnormal exam 7/21 follow-up along with HBO. I will renew her linezolid after checking a CBC with differential monitoring her blood counts especially her platelets. She was supposed to have surgery yesterday but if I am reading things correctly this was canceled after her blood sugar was found to be over 500. I thought Dr. Marla Roe who called me said that they were sending her to the ER but the patient states that was not the case. 7/28. Follow-up along with HBO. She is on linezolid I still do not have any lab work from dialysis even though I called last week. The patient is concerned about an area on her left lateral foot about the level of the base of her fifth metatarsal. I did not really see anything that ominous here however this patient is in [place] ability to point out problems that  she is sensing and she has been accurate in the past Finally she received a call from Dr. Marla Roe who is referring her to another orthopedic surgeon stating that she is too booked up to take her to the operating room now. Was still using a wound VAC on the foot 8/3 -Follow-up after HBO, she is got another week of linezolid, she is to call ID for an appointment, x-rays of both feet were reviewed, the left foot x-ray with third MTP joint osteo- Right foot x-ray widespread osteo-in the right midfoot Right ankle x-ray does not show any active evidence of infection 8/11-Patient is seen after HBO, the wounds on the right foot appear to be about the same, the heel wound had some necrotic base over tendon that was debrided with a curette 8/21; patient is seen after HBO. The patient's  wound on her dorsal foot actually looks reasonably good and there is substantial amount of epithelialization however the open area distally still has a lot of necrotic debris partially bone. I cannot really get a good sense of just how deep this probes under the foot. She has been pressuring me this week to order medical maggots through a company in [place] for her. The problem I have is there is not a defined wound area here. On the positive side there is no purulence. She has been to see infectious disease she is still on Septra DS although I have not had a chance to review their notes 8/28; patient is seen in conjunction with HBO. The wounds on her foot continued to improve including the right dorsal foot substantially the, the distal part of this wound and the area on the right heel. We have been using a wound VAC over this chronically. She is still on trimethoprim as directed by infectious disease 9/4; patient is seen in conjunction with HBO. Right dorsal foot wound substantially anteriorly is better however she continues to have a deep wound in the distal part of this that is not responding. We have been using  silver collagen under border foam Area on the right plantar medial heel seems better. We have been using Hydrofera Blue 12/12/18 on evaluation today patient appears to be doing about the same with regard to her wound based on prior measurements. She does have some necrotic tissue noted on the lateral aspect of the wound that is going require a little bit of sharp debridement today. This includes what appears to be potentially either severely necrotic bone or tendon. Nonetheless other than that she does not appear to have any severe infection which is good news 9/18; it is been 2 weeks since I saw this wound. She is tolerating HBO well. Continued dramatic improvement in the area on the right dorsal foot. She still has a small wound on the heel that we have been using Hydrofera Blue. She continues with a wound VAC 9/24; patient has to be seen emergently today with a swelling on her right lateral lower leg. She says that she told Dr. Evette Doffing about this and also myself on a couple of occasions but I really have no recollection of this. She is not systemically unwell and her wound really looked good the last time I saw this. She showed this to providers at dialysis and she was able to verify that she was started on cephalexin today for 5 doses at dialysis. She dialyzes on Tuesday Thursday and Saturday. 10/2; patient is seen in conjunction with HBO. The area that is draining on the right anterior medial tibia is more extensive. Copious amounts of serosanguineous drainage with some purulence. We are still using the wound VAC on the original wound then it is stable. Culture I did of the original IandD showed MRSA I contacted dialysis she is now on vancomycin with dialysis treatments. I asked them to run a month 10/9; patient seen in conjunction with HBO. She had a new spontaneous open area just above the wound on the right medial tibia ankle. More swelling on the right medial tibia. Her wound on the foot  looks about the same perhaps slightly better. There is no warmth spreading up her leg but no obvious erythema. her MRI of the foot and ankle and distal tib-fib is not booked for next Friday I discussed this with her in great detail over multiple days. it is likely she has  spreading infection upper leg at least involving the distal 25% above the ankle. She knows that if I refer her to orthopedics for infectious disease they are going to recommend amputation and indeed I am not against this myself. We had a good trial at trying to heal the foot which is what she wanted along with antibiotics debridement and HBO however she clearly has spreading infection [probably staph aureus/MRSA]. Nevertheless she once again tells me she wants to wait the left of the MRI. She still makes comments about having her amputation done in [place]. 10/19; arrives today with significant swelling on the lateral right leg. Last culture I did showed Klebsiella. Multidrug-resistant. Cipro was intermediate sensitivity and that is what I have her on pending her MRI which apparently is going to be done on Thursday this week although this seems to be moving back and forth. She is not systemically unwell. We are using silver alginate on her major wound area on the right medial foot and the draining areas on the right lateral lower leg 10/26; MRI showed extensive abscess in the anterior compartment of the right leg also widespread osteomyelitis involving osseous structures of the midfoot and portions of the hindfoot. Also suspicion for osteomyelitis anterior aspect of the distal medial malleolus. Culture I did of the purulence once again showed a multidrug-resistant Klebsiella. I have been in contact with nephrology late last week and she has been started on cefepime at dialysis to replace the vancomycin We sent a copy of her MRI report to Dr. Geroge Baseman in [place] who is an orthopedic surgeon. The patient takes great stock in his  opinion on this. She says she will go to [place] to have her leg amputated if Dr. Geroge Baseman does not feel there is any salvage options. 11/2; she still is not talk to her orthopedic surgeon in [place]. Apparently he will call her at [phone] this afternoon. The quality of this is she has not allowed me to refer her anywhere. She has been told over and over that she needs this amputated but has not agreed to be referred. She tells me her blood sugar was 600 last night but she has not been febrile. 11/9; she never did got a call from the orthopedic surgeon in [place] therefore that is off the radar. We have arranged to get her see orthopedic surgery at [facility]. She still has a lot of draining purulence coming out of the new abscess in her right leg although that probably came from the osteomyelitis in her right foot and heel. Meanwhile the original wound on the right foot looks very healthy. Continued improvement. The issue is that the last MRI showed osteomyelitis in her right foot extensively she now has an abscess in the right anterior lower leg. There is nobody in [place] who will offer this woman anything but an amputation and to be honest that is probably what she needs. I think she still wants to talk about limb salvage although at this point I just do not see that. She has completed her vancomycin at dialysis which was for the original staph aureus she is still on cefepime for the more recent Klebsiella. She has had a long course of both of these antibiotics which should have benefited the osteomyelitis on the right foot as well as the abscess. 11/16; apparently [place] elective surgery is shut down because of COVID-19 pandemic. I have reached out to some contacts at [facility] to see if we can get her an orthopedic appointment there. I  am concerned about continually leaving this but for the moment everything is static. In fact her original large wound on this foot is closing down. It  is the abscess on the right anterior leg that continues to drain purulent serosanguineous material. She is not currently on any antibiotics however she had a prolonged course of vancomycin [1 month] as well as cefepime for a month 02/24/2019 on evaluation today patient appears to be doing better than the last time I saw her. This is not a patient that I typically see. With that being said I am covering for Dr. Dellia Nims this week and again compared to when I last saw her overall the wounds in particular seem to be doing significantly better which is good news. With that being said the patient tells me several disconcerting things. She has not been able to get in to see anyone for potential debridement in regard to her leg wounds although she tells me that she does not think it is necessary any longer because she is taking care of that herself. She noticed a string coming out of the lower wound on her leg over the last week. The patient states that she subsequently decided that we must of pack something in there and started pulling the string out and as it kept coming and coming she realized this was likely her tendon. With that being said she continued to remove as much of this as she could. She then I subsequently proceeded to using tubes of antibiotic ointment which she will stick down into the wound and then scored as much as she can until she sees it coming out of the other wound opening. She states that in doing this she is actually made things better and there is less redness and irritation. With regard to her foot wound she does have some necrotic tendon and tissue noted in one small corner but again the actual wound itself seems to be doing better with good granulation in general compared to my last evaluation. 12/7; continued improvement in the wound on the substantial part of the right medial foot. Still a necrotic area inferiorly that required debridement but the rest of this looks very healthy  and is contracting. She has 2 wounds on the right lateral leg which were her original drainage sites from her abscess but all of this looks a lot better as well. She has been using silver alginate after putting antibiotic biotic ointment in one wound and watching it come out the other. I have talked to her in some detail today. I had given her names of orthopedic surgeons at [facility] for second opinion on what to do about the right leg. I do not think the patient never called them. She has not been able to get a hold of the orthopedic surgeon in [place] that she had put a lot of faith in as being somebody would give her an opinion that she would trust. I talked to her today and said even if I could get her in to another orthopedic surgeon about the leg which she accept an amputation and she said she would not therefore I am not going to press this issue for the moment 12/14; continued improvement in his substantial wound on the right medial foot. There is still a necrotic area inferiorly with tightly adherent necrotic debris which I have been working on debriding each time she is here. She does not have an orthopedic appointment. Since last time she was here I looked over  her cultures which were essentially MRSA on the foot wound and gram-negative rods in the abscess on the anterior leg. 12/21; continued improvement in the area on the right medial foot. She is not up on this much and that is probably a good thing since I do not know it could support continuous ambulation. She has a small area on the right lateral leg which were remanence of the IandD's I did because of the abscess. I think she should probably have prophylactic antibiotics I am going to have to look this over to see if we can make an intelligent decision here. In the meantime her major wound is come down nicely. Necrotic area inferiorly is still there but looks a lot better 04/06/2019; she has had some improvement in the overall  surface area on the right medial foot somewhat narrowedr both but somewhat longer. The areas on the right lateral leg which were initial IandD sites are superficial. Nothing is present on the right heel. We are using silver alginate to the wound areas 1/18; right medial foot somewhat smaller. Still a deep probing area in the most distal recess of the wound. She has nothing open on the right leg. She has a new wound on the plantar aspect of her left fourth toe which may have come from just pulling skin. The patient using Medihoney on the wound on her foot under silver alginate. I cannot discourage her from this 2/1; 2-week follow-up using silver alginate on the right foot and her left fourth toe. The area on the right dorsal foot is contracted although there is still the deep area in the most distal part of the wound but still has some probing depth. No overt infection 2/15; 2-week follow-up. She continues to have improvement in the surface area on the dorsal right foot. Even the tunneling area from last time is almost closed. The area that was on the plantar part of her left fourth toe over the PIP is indeed closed 3/1; 2-week follow-up. Continued improvement in surface area. The original divot that we have been debriding inferiorly I think has full epithelialization although the epithelialization is gone down into the wound with probably 4 mm of depth. Even under intense illumination I am unable to see anything open here. The remanence of the wound in this area actually look quite healthy. We have been using silver alginate 3/15; 2-week follow-up. Unfortunately not as good today. She has a comma shaped wound on the dorsal foot however the upper part of this is larger. Under illumination debris on the surface She also tells [place] that she was on her right leg 2 times in the last couple of weeks mostly to reach up for things above her head etc. She felt a sharp pain in the right leg which she thinks is  somewhere from the ankle to the knee. The patient has neuropathy and is really uncertain. She cannot feel her foot so she does not think it was coming from there 3/29; 2-week follow-up. Her wound measures smaller. Surface of the wound appears reasonable. She is using silver alginate with underlying Medihoney. She has home health. X-rays I did of her tib-fib last time were negative although it did show arterial calcification 4/12; 2-week follow-up. Her wound measures smaller in length. Using manuka honey with silver alginate on top. She has home health. 4/26; 2-week follow-up. Her wound is smaller but still very adherent debris under illumination requiring debridement she has been using manuka honey with silver alginate. She  has home health 08/28/19-Wound has about the same size, but with a layer of eschar at the lateral edge of the amputation site on the right foot. Been using Hydrofera Blue. She is on suppressive Bactrim but apparently she has been taking it twice daily Electronic Signature(s) Signed: 08/28/2019 4:01:06 PM By: Nancy Bastos MD, MBA Entered By: [name] on 08/28/2019 16:01:06 -------------------------------------------------------------------------------- Physical Exam Details Patient Name: Date of Service: Nancy LLO [name] 08/28/2019 3:30 PM Medical Record Number: [id number] Patient Account Number: [id number] Date of Birth/Sex: Treating RN: 1970/06/10 (49 y.o. F) [name] Primary Care Provider: Sanjuana [name], [name] Other Clinician: Referring Provider: Treating Provider/Extender: [name] MO REIRA, [name] Weeks in Treatment: 25 Constitutional alert and oriented x 3. sitting or standing blood pressure is within target range for patient.. supine blood pressure is within target range for patient.. pulse regular and within target range for patient.Marland Kitchen respirations regular, non-labored and within target range for patient.Marland Kitchen temperature within target range  for patient.. . . Well- nourished and well-hydrated in no acute distress. Notes Right forefoot transmet wound with a rim of eschar under hematoma on the medial aspect, this was debrided and removed with pickup and scalpel Electronic Signature(s) Signed: 08/28/2019 4:01:53 PM By: Nancy Bastos MD, MBA Entered By: [name] on 08/28/2019 16:01:53 -------------------------------------------------------------------------------- Physician Orders Details Patient Name: Date of Service: Nancy LLO [name] 08/28/2019 3:30 PM Medical Record Number: [id number] Patient Account Number: [id number] Date of Birth/Sex: Treating RN: 1970/05/23 (49 y.o. [name] Primary Care Provider: Sanjuana [name], [name] Other Clinician: Referring Provider: Treating Provider/Extender: [name] MO REIRA, [name] Weeks in Treatment: 27 Verbal / Phone Orders: No Diagnosis Coding ICD-10 Coding Code Description [phone] Other chronic osteomyelitis, right ankle and foot L97.514 Non-pressure chronic ulcer of other part of right foot with necrosis of bone E10.621 Type 1 diabetes mellitus with foot ulcer Follow-up Appointments Return Appointment in 2 weeks. Dressing Change Frequency Wound #43 Right,Medial Foot Change dressing three times week. Wound Cleansing Clean wound with Wound Cleanser - Anasept to all wounds Primary Wound Dressing Wound #43 Right,Medial Foot Hydrofera Blue - Classic - moistened with saline Secondary Dressing Wound #43 Right,Medial Foot Kerlix/Rolled Gauze Dry Gauze Edema Control Elevate legs to the level of the heart or above for 30 minutes daily and/or when sitting, a frequency of: - throughout the day [place] skilled nursing for wound care. - Interim Electronic Signature(s) Signed: 08/28/2019 4:30:26 PM By: Nancy Bastos MD, MBA Signed: 08/28/2019 4:59:31 PM By: [name] Entered By: [name] on 08/28/2019  15:59:28 -------------------------------------------------------------------------------- Problem List Details Patient Name: Date of Service: Nancy LLO [name] 08/28/2019 3:30 PM Medical Record Number: [id number] Patient Account Number: [id number] Date of Birth/Sex: Treating RN: 01-10-1971 (49 y.o. [name] Primary Care Provider: Other Clinician: Sanjuana [name], [name] Referring Provider: Treating Provider/Extender: [name] MO REIRA, [name] Weeks in Treatment: 37 Active Problems ICD-10 Encounter Code Description Active Date MDM Diagnosis [phone] Other chronic osteomyelitis, right ankle and foot 09/03/2018 No Yes L97.514 Non-pressure chronic ulcer of other part of right foot with necrosis of bone 09/03/2018 No Yes E10.621 Type 1 diabetes mellitus with foot ulcer 09/24/2018 No Yes Inactive Problems ICD-10 Code Description Active Date Inactive Date L97.521 Non-pressure chronic ulcer of other part of left foot limited to breakdown of skin 04/20/2019 04/20/2019 L97.812 Non-pressure chronic ulcer of other part of right lower leg with fat layer exposed  02/24/2019 02/24/2019 Resolved Problems ICD-10 Code Description Active Date Resolved Date L02.415 Cutaneous abscess of right lower limb 12/25/2018 12/25/2018 Electronic Signature(s) Signed: 08/28/2019 4:30:26 PM By: Nancy Bastos MD, MBA Signed: 08/28/2019 4:59:31 PM By: [name] Entered By: [name] on 08/28/2019 15:57:34 -------------------------------------------------------------------------------- Progress Note Details Patient Name: Date of Service: Nancy LLO [name] 08/28/2019 3:30 PM Medical Record Number: [id number] Patient Account Number: [id number] Date of Birth/Sex: Treating RN: June 27, 1970 (49 y.o. [name] Primary Care Provider: Sanjuana [name], [name] Other Clinician: Referring Provider: Treating Provider/Extender: [name] MO REIRA, [name] Weeks in Treatment:  51 Subjective History of Present Illness (HPI) 49 year old diabetic who is known to have type 1 diabetes which is poorly controlled last hemoglobin A1c was 11%. She comes in with a ulcerated area on the left lateral foot which has been there for over 6 months. Was recently she has been treated by Dr. Amalia [name] of podiatry who saw her last on 05/28/2016. Review of his notes revealed that the patient had incision and drainage with placement of antibiotic beads to the left foot on 04/11/2016 for possible osteomyelitis of the cuboid bone. Over the last year she's had a history of amputation of the left fifth toe and a femoropopliteal popliteal bypass graft somewhere in April 2017. 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 His note Dr. Amalia [name] mentions that the patient has been referred to me for further wound care and possibly great candidate for hyperbaric oxygen therapy due to recurrent osteomyelitis. However we do not have any x-rays of biopsy reports confirming this. He has been on several antibiotics including Bactrim and most recently is on doxycycline for an MRSA. I understand, the patient was not a candidate for IV antibiotics as she has had previous PICC lines which resulted in blood clots in both arms. There was a x-ray report dated 04/04/2016 on Dr. Amalia [name] notes which showed evidence of fifth ray resection left foot with osteolytic changes noted to the fourth metatarsal and cuboid bone on the left. 06/13/2016 -- had a left foot x-ray which showed no acute fracture or dislocation and no definite radiographic evidence of osteomyelitis. Advanced osteopenia was seen. 06/20/2016 -- she has noticed a new wound on the right plantar foot in the region where she had a callus before. 06/27/16- the patient did have her x-ray of the right foot which showed no findings to suggest osteomyelitis. She saw her endocrinologist, NancyKumar, yesterday. Her A1c in January was 11. He also indicates  mismanagement and noncompliance regarding her diabetes. She is currently on Bactrim for a lip infection. She is complaining of nausea, vomiting and diarrhea. She is unable to articulate the exact orders or dosing of the Bactrim; it is unclear when she will complete this. 07/04/2016 -- results from [facility] of ABIs with ankle waveforms were noted from 02/14/2016. The examination done on 06/27/2015 showed noncompressible ABIs with the right being 1.45 and the left being 1.33. The present examination showed a right ABI of 1.19 on the left of 1.33. The conclusion was that right normal ABI in the lower extremity at rest however compared to previous study which was noncompressible ABI may be falsely elevated side suggesting medial calcification. The left ABI suggested medial calcification. 08/01/2016 -- the patient had more redness and pain on her right foot and did not get to come to see as noted she see her PCP or go to the ER and decided to take some leftover metronidazole which she  had at home. As usual, the patient does report she feels and is rather noncompliant. 08/08/2016 -- -- x-ray of the right foot -- FINDINGS:Transmetatarsal amputation is noted. No bony destruction is noted to suggest osteomyelitis. IMPRESSION: No evidence of osteomyelitis. Postsurgical changes are seen. MRI would be more sensitive for possible bony changes. Culture has grown Serratia Marcescens -- sensitive to Bactrim, ciprofloxacin, ceftazidime she was seen by Dr. Daylene [name] on 08/06/2016. He did not find any exposed bone, muscle, tendon, ligament or joint. There was no malodor and he did a excisional debridement in the office. ============ Old notes: 49 year old patient who is known to the wound clinic for a while had been away from the wound clinic since 09/01/2014. Over the last several months she has been admitted to various hospitals including [facility] [place]. She was treated for a right metatarsal  osteomyelitis with a transmetatarsal amputation and this was done about 2 months ago. He has a small ulcerated area on the right heel and she continues to have an ulcerated area on the left plantar aspect of the foot. The patient was recently admitted to the [facility] hospital group between 7/12 and 10/18/2014. she was given 3 weeks of IV vancomycin and was to follow-up with her surgeons at [facility] and also took oral vancomycin for C. difficile colitis. 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 MRI of the left foot done on 10/14/2014 showed no abscess or osteomyelitis. 04/27/15; this is a patient we know from previous stays in the wound care center. She is a type I diabetic I am not sure of her control currently. Since the last time I saw her she is had a right transmetatarsal amputation and has no wounds on her right foot and has no open wounds. She is been followed at the wound care center at [facility] in [place]. She comes today with the desire to undergo hyperbaric treatment locally. Apparently one of her wound care providers in [place] has suggested hyperbarics. This is in response to an MRI from 04/18/15 that showed increased marrow signal and loss of the proximal fifth metatarsal cortex evidence of osteomyelitis with likely early osteomyelitis in the cuboid bone as well. She has a large wound over the base of the fifth metatarsal. She also has a eschar over her the tips of her toes on 1,3 and 5. She does not have peripheral pulses and apparently is going for an angiogram tomorrow which seems reasonable. After this she is going to infectious disease at [facility]. They have been using Medihoney to the  large wound on the lateral aspect of the left foot to. The patient has known Charcot deformity from diabetic neuropathy. She also has known diabetic PAD. Surprisingly I can't see that she has had any recent antibiotics, the patient states the last antibiotic she had was at the end of November for 10 days. I think this was in response to culture that showed group G strep although I'm not exactly sure where the culture was from. She is also had arterial studies on 03/29/15. This showed a right ABI of 1.4 that was noncompressible. Her left ABI was 0.73. 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 It was not felt that arterial inflow was adequate for healing of a foot ulcer.  Her Doppler waveforms looked monophasic ===== READMISSION 02/28/17; this is in an now 50 year old woman we've had at several different occasions in this clinic. 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 She has a remote ex-smoker. She was last seen in this clinic by Dr. Con Memos I think in May. More recently she is been followed by her podiatrist Dr. Amalia [name] an infectious disease Dr. Megan Salon. She has 2 open wounds the major one is over the right first metatarsal head she also has a wound on the left plantar foot. an MRI of the right foot on 01/01/17 showed a soft tissue ulcer along the plantar aspect of the first metatarsal base consistent with osteomyelitis of the first metatarsal stump. Dr. 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 According to the patient this is been open for slightly over a month. She has been on a combination of Cipro 500 twice a day, Zyvox 600 twice a day and Flagyl 500 3 times a day for over a month now as directed by Dr. Megan Salon. cultures of the right foot earlier this year showed MRSA in January and Serratia in May. January also had a few viridans strep. Recent x-rays of both  feet were done and Dr. Amalia [name] office and I don't have these reports. The patient has known PAD and has a history of aleft femoropopliteal bypass in April 2017. 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 Dwyane Dee. In spite of this the last hemoglobin A1c I can see is 10.1 on 01/01/2017. She is being referred by Dr. 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 She is been using Medihoney to this area and also an area on the left midfoot. She is using healing sandals bilaterally. ABIs in this clinic at the left posterior tibial was 1.1 noncompressible on the right READMISSION Non invasive vascular NOVANT 5/18 Aftercare following surgery of the circulatory system Procedure Note - Interface, External Ris In - 08/13/2016 11:05 AM EDT Procedure: Examination consists of physiologic resting arterial pressures of the brachial and ankle arteries bilaterally with continuous wave Doppler waveform analysis. Previous: Previous exam performed on 02/14/16 demonstrated ABIs of Rt = 1.19 and Lt = 1.33. Right: ABI = non-compressible PT 1.47 DP. S/P transmet amputation. , Left: ABI = 1.52, 2nd digit pressure = 87 mmHg Conclusions: Right: ABI (>1.3) may be falsely elevated, suggesting medial calcification. Left: ABI (>1.3) may be falsely elevated, suggesting medial calcification The patient is a now 49 year old type I diabetic is had multiple issues her graded to chronic diabetic foot ulcers. 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 We had her in the clinic lastin November. At that point she had wounds on her bilateral feet.she had wanted to try hyperbarics however the healogics review process denied  her because she hadn't followed up with her vascular surgeon for her left femoropopliteal bypass. The bypass was done by Dr. Raul Del at [facility]. We made her a follow-up with Dr. Raul Del however she did not keep the appointment and therefore she was not approved The patient shows me a small wound on her left fourth metatarsal head on her phone. She developed rapid discoloration in  the plantar aspect of the left foot and she was admitted to hospital from 2/2 through 05/10/17 with wet gangrene of the left foot osteomyelitis of the fourth metatarsal heads. She was admitted acutely ill with a temperature of 103. She was started on broad-spectrum vancomycin and cefepime. On 05/06/17 she was taken to the OR by Dr. Amalia [name] her podiatric surgeon for an incision and drainage irrigation of the left foot wound. Cultures from this surgery revealed group be strep and anaerobes. she was seen by NancyXu of orthopedic surgery and scheduled for a below-knee amputation which she u refused. Ultimately she was discharged on Levaquin and Flagyl for one month. MRI 05/05/17 done while she was in the hospital showed abscess adjacent to the fourth metatarsal head and neck small abscess around the fourth flexor tendon. Inflammatory phlegmon and gas in the soft tissues along the lateral aspect of the fourth phalanx. Findings worrisome for osteomyelitis involving the fourth proximal and middle phalanx and also the third and fourth metatarsals. Finally the patient had actually shortly before this followed up with Dr. Raul Del at no time on 04/29/17. He felt that her left femoropopliteal bypass was patent he felt that her left-sided toe pressures more than adequate for healing a wound on the left foot. This was before her acute presentation. 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 05/28/17; she is started hyperbarics. The patient tells me that for some reason she was not actually on Levaquin but I think on ciprofloxacin. She was on  Flagyl. She only started her Levaquin yesterday due to some difficulty with the pharmacy and perhaps her sister picking it up. She has an appointment with Dr. Amalia [name] tomorrow and with infectious disease early next week. She has no new complaints 06/06/17; the patient continues in hyperbarics. She saw Dr. Amalia [name] on 05/29/17 who is her podiatric surgeon. He is elected for a transmetatarsal amputation on 06/27/17. I'm not sure at what level he plans to do this amputation. The patient is unaware ooShe also saw Dr. Megan Salon of infectious disease who elected to continue her on current antibiotics I think this is ciprofloxacin and Flagyl. I'll need to clarify with her tomorrow if she actually has this. We're using silver alginate to the actual wound. Necrotic surface today with material under the flap of her foot. ooOriginal MRI showed abscesses as well as osteomyelitis of the proximal and middle fourth phalanx and the third and fourth metatarsal heads 06/11/17; patient continues in hyperbarics and continues on oral antibiotics. She is doing well. The wound looks better. The necrotic part of this under the flap in her superior foot also looks better. she is been to see Dr. Amalia [name]. I haven't had a chance to look at his note. Apparently he has put the transmetatarsal amputation on hold her request it is still planning to take her to the OR for debridement and product application ACEL. I'll see if I can find his note. I'll therefore leave product ordering/requests to Dr. Amalia [name] for now. I was going to look at Dermagraft 06/18/17-she is here in follow-up evaluation for bilateral foot wounds. She continues with hyperbaric therapy. She states she has been applying manuka honey to the right plantar foot and alternate manuka honey and silver alginate to the left foot, despite our orders. We will continue with same treatment plan and she will follo up next week. 06/25/17; I have reviewed Dr. Amalia [name] last note from 3/11. She  has operative debridement in 2 days' time. By review his note apparently they're going  to place there is skin over the majority of this wound which is a good choice. She has a small satellite area at the most proximal part of this wound on the left plantar foot. The area on the right plantar foot we've been using silver alginate and it is close to healing. 07/02/17; unfortunately the patient was not easily approved for Dr. Amalia [name] proposed surgery. I'm not completely certain what the issue is. She has been using silver alginate to the wound she has completed a first course of hyperbarics. She is still on Levaquin and Flagyl. I have really lost track of the time course here.I suspect she should have another week to 2 of antibiotics. I'll need to see if she is followed up with infectious disease Dr. Megan Salon 07/09/17; the patient is followed up with Dr. Megan Salon. 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 She continues on Levaquin and metronidazole continuing both of these for now I think she is been on fr about 6 weeks. She still has some drainage but no pain. No fever. Her had been plans for her to go to the OR for operative debridement with her podiatrist Dr. Amalia [name], I am not exactly sure where that is. I'll probably slip a note to Dr. Amalia [name] today. I note that she follows with Dr. Dwyane Dee of endocrinology. We have her recertified for hyperbaric oxygen. I have not heard about Dermagraft however I'll see if Dr. Amalia [name] is planning a skin substitute as well 07/16/17; the patient tells me she is just about out of [place]. I'll need to check Dr. Hale Bogus last notes on this. She states she has plenty of Flagyl however. She comes in today complaining of pain in the right lateral foot which she said lasted for about a day. The wound on the right foot is actually much more medially. She also tells me that the [facility] cost a lot of pain in the left foot wound and she  turned back to silver alginate. Finally Dermagraft has a $027 per application co-pay. She cannot afford this 07/23/17; patient arrives today with the wound not much smaller. There is not much new to add. She has not heard from Dr. Amalia [name] all try to put in a call to them today. She was asking about Dermagraft again and she has an over $741 per application co-pay she states that she would be willing to try to do a payment plan. I been tried to avoid this. We've been using silver alginate, I'll change to [facility] 07/30/17-She is here in follow-up evaluation for left foot ulcer. She continues hyperbaric medicine. The left foot ulcer is stable we will continue with same treatment plan 08/06/17; she is here for evaluation of her left foot ulcer. Currently being treated for hyperbarics or underlying osteomyelitis. She is completed antibiotics. The left foot ulcer is better smaller with healthier looking granulation. For various reasons I am not really clear on we never got her back to the OR with Dr. Amalia [name]. He did not respond to my secure text message. Nevertheless I think that surgery on this point is not necessary nor am I completely clear that a skin substitute is necessary The patient is complaining about pain on the outside of her right foot. 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 There is no erythema. She also states the foot is warm versus her other part of her upper leg and this is largely true. It is not totally clear  to me what's causing this. She thinks it's different from her usual neuropathy pain 08/13/17; she arrives in clinic today with a small wound which is superficial on her right first metatarsal head. 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 She tells [place] she was up on her feet over the Mother's Day celebration. ooThe large wound is on the left foot. Continues with hyperbarics for underlying osteomyelitis. We're using Hydrofera Blue. She asked me today  about where we were with Dermagraft. I had actually excluded this because of the co-pay however she wants to assume this therefore I'll recheck the co-pay an order for next week. 08/20/17; the patient agreed to accept the co-pay of the first Apligraf which we applied today. She is disappointed she is finishing hyperbarics will run this through the insurance on the extent of the foot infection and the extent of the wound that she had however she is already had 60 dive's. Dermagraft No. 1 08/27/17; Dermagraft No. 2. She is not eligible for any more hyperbaric treatments this month. She reports a fair amount of drainage and she actually changed to the external dressings without disturbing the direct contact layer 09/03/17; the patient arrived in clinic today with the wound superficially looking quite healthy. Nice vibrant red tissue with some advancing epithelialization although not as much adherence of the flap as I might like. However she noted on her own fourth toe some bogginess and she brought that to our attention. Indeed this was boggy feeling like a possibility of subcutaneous fluid. 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 She is not been unwell. We've been using Dermagraft 09/10/17; the culture that I did not last week was MRSA. She saw Dr. Megan Salon this morning who is going to start her on vancomycin. I had sent him a secure a text message yesterday. I also spoke with her podiatric surgeon Dr. Amalia [name] about surgery on this foot the options for conserving a functional foot etc. Promised me he would see her and will make back consultation today. Paradoxically her actual wound on the plantar aspect of her left foot looks really quite good. I had given her 5 days worth of Baxdella to cover her for MRSA. Her MRI came back showing osteomyelitis within the third metatarsal shaft and head and base of the third and fourth proximal  phalanx. She had extensive inflammatory changes throughout the soft tissue of the lateral forefoot. With an ill-defined fluid around the fourth metatarsal extending into the plantar and dorsal soft tissues 09/19/17; the patient is actually on oral Septra and Flagyl. She apparently refused IV vancomycin. She also saw Dr. Amalia [name] at my request who is planning her for a left BKA sometime in mid July. MRI showed osteomyelitis within the third metatarsal shaft and head and the basis of the third and fourth proximal phalanx. I believe there was felt to be possible septic arthritis involving the third MTP. 09/26/17; the patient went back to Dr. Megan Salon at my suggestion and is now receiving IV daptomycin. Her wound continues to look quite good making the decision to proceed with a transmetatarsal amputation although more difficult for the patient. I believe in my extensive discussions with her she has a good sense of the pros and cons of this. I don't NV the tuft decision she has to make. She has an appointment with Dr. Amalia [name] I believe in mid July and I previously spoken  to him about this issue Has we had used 3 previous Dermagraft. Given the condition of the wound surface I went ahead and added the fourth one today area and I did this not fully realizing that she'll be traveling to [place] next week. I'm hopeful she can come back in 2 weeks 10/21/17; Her same Dermagraft on for about 3-1/2 weeks. In spite of this the wound arrives looking quite healthy. There is been a lot of healing dimensions are smaller. Looking at the square shaped wound she has now there is some undermining and some depth medially under the undermining although I cannot palpate any bone. No surrounding infection is obvious. She has difficult questions about how to look at this going forward vis--vis amputations versus continued medical therapy. T be truthful the wound is looks so o healthy and it is continued to contract. Hard to justify  foot surgery at this point although I still told her that I think it might come to that if we are not able to eradicate the underlying MRSA. She is still highly at risk and she understands this 11/06/17 on evaluation today patient appears to be doing better in regard to her foot ulcer. She's been tolerating the dressing changes without complication. Currently she is here for her Dermagraft #6. Her wound continues to make excellent progress at this point. She does not appear to have any evidence of infection which is good news. 11/13/17 on evaluation today patient appears to be doing excellent at this time. She is here for repeat Dermagraft application. This is #7. Overall her wound seems to be making great progress. 12/05/17; the patient arrives with the wound in much better condition than when I last saw this almost 6 weeks ago. She still has a small probing area in the left metatarsal head region on the lateral aspect of her foot. We applied her last Dermagraft today. ooSince the last time she is here she has what appears to a been a blood blister on the plantar aspect of left foot although I don't see this is threatening. There is also a thick raised tissue on the right mid metatarsal head region. This was not there I don't think the last time she was here 3 weeks ago. 12/12/17; the patient continues to have a small programming area in the left metatarsal head region on the lateral aspect of her foot which was the initial large surgical wound. I applied her last Apligraf last week. I'm going to use Endoform starting today ooUnfortunately she has an excoriated area in the left mid foot and the right mid foot. The left midfoot looks like a blistered area this was not opened last week it certainly is open today. Using silver alginate on these areas. She promises me she is offloading this. 12/19/17; the small probing area in the left metatarsal head eyes think is shallower. In general her original wound  looks better. We've been using Endoform. The area inferiorly that I think was trauma last week still requires debridement a lot of nonviable surface which I removed. She still has an open open area distally in her foot ooSimilarly on the right foot there is tightly adherent surface debris which I removed. Still areas that don't look completely epithelialized. This is a small open area. We used silver alginate on these areas 12/26/2017; the patient did not have the supplies we ordered from last week including the Endoform. The original large wound on the left lateral foot looks healthy. She still has the  undermining area that is largely unchanged from last week. She has the same heavily callused raised edged wounds on the right mid and left midfoot. Both of these requiring debridement. We have been using silver alginate on these areas 01/02/2018; there is still supply issues. We are going to try to use Prisma but I am not sure she actually got it from what she is saying. She has a new open area on the lateral aspect of the left fourth toe [previous fifth ray amputation]. Still the one tunneling area over the fourth metatarsal head. The area is in the midfoot bilaterally still have thick callus around them. She is concerned about a raised swelling on the lateral aspect of the foot. However she is completely insensate 01/10/2018; we are using Prisma to the wounds on her bilateral feet. Surprisingly the tunneling area over the left fourth metatarsal head that was part of her original surgery has closed down. She has a small open area remaining on the incision line. 2 open areas in the midfoot. 02/10/2018; the patient arrives back in clinic after a month hiatus. She was traveling to visit family in [place]. Is fairly clear she was not offloading the areas on her feet. The original wound over the left lateral foot at the level of metatarsal heads is reopened and probes medially by about a centimeter or 2.  She notes that a week ago she had purulent drainage come out of an area on the left midfoot. Paradoxically the worst area is actually on the right foot is extensive with purulent drainage. We will use silver alginate today 02/17/2018; the patient has 3 wounds one over the left lateral foot. She still has a small area over the metatarsal heads which is the remnant of her original surgical wound. This has medial probing depth of roughly 1.4 cm somewhat better than last week. The area on the right foot is larger. We have been using silver alginate to all areas. The area on the right foot and left foot that we cultured last week showed both Klebsiella and Proteus. Both of these are quinolone sensitive. The patient put her's self on Bactrim and Flagyl that she had left hanging around from prior antibiotic usages. She was apparently on this last week when she arrived. I did not realize this. Unfortunately the Bactrim will not cover either 1 of these organisms. We will send in Cipro 500 twice daily for a week 03/04/2018; the patient has 2 wounds on the left foot one is the original wound which was a surgical wound for a deep DFU. At one point this had exposed bone. She still has an area over the fourth metatarsal head that probes about 1.4 cm although I think this is better than last week. I been using silver nitrate to try and promote tissue adherence and been using silver alginate here. ooShe also has an area in the left midfoot. This has some depth but a small linear wound. Still requiring debridement. ooOn the right midfoot is a circular wound. A lot of thick callus around this area. ooWe have been using silver alginate to all wound areas ooShe is completed the ciprofloxacin I gave her 2 weeks ago. 03/11/2018; the patient continues to have 2 open areas on the left foot 1 of which was the original surgical wound for a deep DFU. Only a small probing area remains although this is not much different  from last week we have been using silver alginate. The other area is on the  midfoot this is smaller linear but still with some depth. We have been using silver alginate here as well ooOn the right foot she has a small circular wound in the mid aspect. This is not much smaller than last time. We have been using silver alginate here as well 03/18/2018; she has 3 wounds on the left foot the original surgical wound, a very superficial wound in the mid aspect and then finally the area in the mid plantar foot. She arrives in today with a very concerning area in the wound in the mid plantar foot which is her most proximal wound. There is undermining here of roughly 1-1/2 cm superiorly. Serosanguineous drainage. She tells me she had some pain on for over the weekend that shot up her foot into her thigh and she tells me that she had a nodule in the groin area. ooShe has the single wound in the right foot. ooWe are using endoform to both wound areas 03/24/2018; the patient arrives with the original surgical wound in the area on the left midfoot about the same as last week. There is a collection of fluid under the surface of the skin extending from the surgical wound towards the midfoot although it does not reach the midfoot wound. The area on the right foot is about the same. Cultures from last week of the left midfoot wound showed abundant Klebsiella abundant Enterococcus faecalis and moderate methicillin resistant staph I gave her Levaquin but this would have only covered the Klebsiella. She will need linezolid 04/01/2018; she is taking linezolid but for the first few days only took 1 a day. I have advised her to finish this at twice daily dosing. In any case all of her wounds are a lot better especially on the left foot. The original surgical wound is closed. The area on the left midfoot considerably smaller. The area on the right foot also smaller. 04/08/2018; her original surgical wound/osteomyelitis on  the left foot remains closed. She has area on the left foot that is in the midfoot area but she had some streaking towards this. This is not connected with her original wound at least not visually. ooSmall wound on the right midfoot appears somewhat smaller. 04/15/18; both wounds looks better. Original wound is better left midfoot. Using silver alginate 1/21; patient states she uses saltwater soak in, stones or remove callus from around her wounds. She is also concerned about a blood blister she had on the left foot but it simply resolved on its own. We've been using silver alginate 1/28; the patient arrives today with the same streaking area from her metatarsals laterally [the site of her original surgical wound] down to the middle of her foot. There is some drainage in the subcutaneous area here. This concerns me that there is actually continued ongoing infection in the metatarsals probably the fourth and third. This fixates an MRI of the foot without contrast [chronic renal failure] ooThe wound in the mid part of the foot is small but I wonder whether this area actually connects with the more distal foot. ooThe area on the right midfoot is probably about the same. Callus thick skin around the small wound which I removed with a curette we have been using silver alginate on both wound areas 2/4; culture I did of the draining site on the left foot last time grew methicillin sensitive staph aureus. MRI of the left foot showed interval resolution of the findings surrounding the third metatarsal joint on the prior study consistent  with treated osteomyelitis. Chronic soft tissue ulceration in the plantar and lateral aspect of the forefoot without residual focal fluid collection. No evidence of recurrent osteomyelitis. Noted to have the previous amputation of the distal first phalanx and fifth ray MRI of the right foot showed no evidence of osteomyelitis I am going to treat the patient with a prolonged  course of antibiotics directed against MSSA in the left foot 2/11; patient continues on cephalexin. She tells me she had nausea and vomiting over the weekend and missed 2 days. In general her foot looks much the same. She has a small open area just below the left fourth metatarsal head. A linear area in the left midfoot. Some discoloration extending from the inferior part of this into the left lateral foot although this appears to be superficial. She has a small area on the right midfoot which generally looks smaller after debridement 2/18; the patient is completing his cephalexin and has another 2 days. She continues to have open areas on the left and right foot. 2/25; she is now off antibiotics. The area on the left foot at the site of her original surgical wound has closed yet again. She still has open areas in the mid part of her foot however these appear smaller. The area on the right mid foot looks about the same. We have been using silver alginate She tells me she had a serious hypoglycemic spell at home. She had to have EMS called and get IV dextrose 3/3; disappointing on the left lateral foot large area of necrotic tissue surrounding the linear area. This appears to track up towards the same original surgical wound. Required extensive debridement. The area on the right plantar foot is not a lot better also using silver 3/12; the culture I did last time showed abundant enterococcus. I have prescribed Augmentin, should cover any unrecognized anaerobes as well. In addition there were a few MRSA and Serratia that would not be well covered although I did not want to give her multiple antibiotics. She comes in today with a new wound in the right midfoot this is not connected with the original wound over her MTP a lot of thick callus tissue around both wounds but once again she said she is not walking on these areas 3/17-Patient comes in for follow-up on the bilateral plantar wounds, the right  midfoot and the left plantar wound. Both these are heavily callused surrounding the wounds. We are continuing to use silver alginate, she is compliant with offloading and states she uses a wheelchair fairly often at home 3/24; both wound areas have thick callus. However things actually look quite a bit better here for the majority of her left foot and the right foot. 3/31; patient continues to have thick callused somewhat irritated looking tissue around the wounds which individually are fairly superficial. There is no evidence of surrounding infection. We have been using silver alginate however I change that to [facility] today 4/17; patient returns to clinic after having a scare with Covid she tested negative in her primary doctor's office. She has been using Hydrofera Blue. She does not have an open area on the right foot. On the left foot she has a small open area with the mid area not completely viable. She showed me pictures of what looks like a hemorrhagic blister from several days ago but that seems to have healed over this was on the lateral left foot 4/21; patient comes in to clinic with both her wounds on  her feet closed. However over the weekend she started having pain in her right foot and leg up into the thigh. She felt as though she was running a low-grade fever but did not take her temperature. She took a doxycycline that she had leftover and yesterday a single Septra and metronidazole. She thinks things feel somewhat better. 4/28; duplex ultrasound I ordered last week was negative for DVT or superficial thrombophlebitis. She is completed the doxycycline I gave her. States she is still having a lot of pain in the right calf and right ankle which is no better than last week. She cannot sleep. She also states she has a temperature of up to 101, coughing and complaining of visual loss in her bilateral eyes. Apparently she was tested for Covid 2 weeks ago at [facility] and that was  negative. Readmission: 09/03/18 patient presents back for reevaluation after having been evaluated at the end of April regarding erythema and swelling of her right lower extremity. Subsequently she ended up going to the hospital on 07/29/18 and was admitted not to be discharged until 08/08/18. Unfortunately it was noted during the time that she was in the hospital that she did have methicillin-resistant Staphylococcus aureus as the infection noted at the site. It was also determined that she did have osteomyelitis which appears to be fairly significant. She was treated with vancomycin and in fact is still on IV vancomycin at dialysis currently. This is actually slated to continue until 09/12/18 at least which will be the completion of the six weeks of therapy. Nonetheless based on what I'm seeing at this point I'm not sure she will be anywhere near ready to discontinue antibiotics at that time. Since she was released from the hospital she was seen by Dr. Amalia [name] who is her podiatrist on 08/27/18. His note specifically states that he is recommended that the patient needs of one knee amputation on the right as she has a life- threatening situation that can lead quickly to sepsis. The patient advised she would like to try to save her leg to which Dr. Amalia [name] apparently told her that this was against all medical advice. She also want to discontinue the Wound VAC which had been initiated due to the fact that she wasn't pleased with how the wound was looking and subsequently she wanted to pursue applying Medihoney at that time. He stated that he did not believe that the right lower extremity was salvageable and that the patient understood but would still like to attempt hyperbaric option therapy if it could be of any benefit. She was therefore referred back to [place] for further evaluation. He plans to see her back next week. Upon inspection today patient has a significant amount purulent drainage noted from the wound at  this point. The bone in the distal portion of her foot also appears to be extremely necrotic and spongy. When I push down on the bone it bubbles and seeps purulent drainage from deeper in the end of the foot. I do not think that this is likely going to heal very well at all and less aggressive surgical debridement were undertaken more than what I believe we can likely do here in our office. 09/12/2018; I have not seen this patient since the most recent hospitalization although she was in our clinic last week. I have reviewed some of her records from a complex hospitalization. 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 There is situation was complicated by  MRSA bacteremia and acute on chronic renal failure now on dialysis. She is receiving vancomycin at dialysis. We started her on Dakin's wet-to-dry last week she is changing this daily. There is still purulent drainage coming out of her foot. Although she is apparently "agreeable" to a below-knee amputation which is been suggested by multiple clinicians she wants this to be done in [place]. She apparently has a telehealth visit with that provider sometime in late [place] 6/24. I have told her I think this is probably too long. Nevertheless I could not convince her to allow a local doctor to perform BKA. 09/19/2018; the patient has a large necrotic area on the right anterior foot. She has had previous transmetatarsal amputations. Culture I did last week showed MRSA nothing else she is on vancomycin at dialysis. She has continued leaking purulent drainage out of the distal part of the large circular wound on the right anterior foot. She apparently went to see Dr. 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 Somehow that translated into her being referred to plastic surgery for debridement of the area. I gather she basically refused amputation although I do not have a copy of Dr.  Berenice Primas notes. The patient really wants to have a trial of hyperbaric oxygen. I agreed with initial assessment in this clinic that this was probably too far along to benefit however if she is going to have plastic surgery I think she would benefit from ancillary hyperbaric oxygen. The issue here is that the patient has benefited as maximally as any patient I have ever seen from hyperbaric oxygen therapy. Most recently she had exposed bone on the lateral part of her left foot after a surgical procedure and that actually has closed. She has eschared areas in both heels but no open area. She is remained systemically well. I am not optimistic that anything can be done about this but the patient is very clear that she wants an attempt. The attempt would include a wound VAC further debridements and hyperbaric oxygen along with IV antibiotics. 6/26; I put her in for a trial of hyperbaric oxygen only because of the dramatic response she has had with wounds on her left midfoot earlier this year which was a surgical wound that went straight to her bone over the metatarsal heads and also remotely the left third toe. We will see if we can get this through our review process and insurance. She arrives in clinic with again purulent material pouring out of necrotic bone on the top of the foot distally. There is also some concerning erythema on the front of the leg that we marked. It is bit difficult to tell how tender this is because of neuropathy. I note from infectious disease that she had her vancomycin extended. 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 She had the wound VAC on for part of the week. The rest of the time she is putting various things on this including Medihoney, "ionized water" silver sorb gel etc. 7/7; follow-up along with HBO. She is still on vancomycin at dialysis. She has a large open area on the dorsal right foot and a small dark eschar area on her heel. There  is a lot less erythema in the area and a lot less tenderness. From an infection point of view I think this is better. She still has a lot of necrosis in the remaining right forefoot [previous TMA] we are still using the wound VAC in this  area 7/16; follow-up along with HBO. I put her on linezolid after she finished her vancomycin. We started this last Friday I gave her 2 weeks worth. I had the expectation that she would be operatively debrided by Dr. Marla Roe but that still has not happened yet. Patient phoned the office this week. She arrives for review today after HBO. The distal part of this wound is completely necrotic. Nonviable pieces of tendon bone was still purulent drainage. Also concerning that she has black eschar over the heel that is expanding. I think this may be indicative of infection in this area as well. She has less erythema and warmth in the ankle and calf but still an abnormal exam 7/21 follow-up along with HBO. I will renew her linezolid after checking a CBC with differential monitoring her blood counts especially her platelets. She was supposed to have surgery yesterday but if I am reading things correctly this was canceled after her blood sugar was found to be over 500. I thought Dr. Marla Roe who called me said that they were sending her to the ER but the patient states that was not the case. 7/28. Follow-up along with HBO. She is on linezolid I still do not have any lab work from dialysis even though I called last week. The patient is concerned about an area on her left lateral foot about the level of the base of her fifth metatarsal. I did not really see anything that ominous here however this patient is in [place] ability to point out problems that she is sensing and she has been accurate in the past Finally she received a call from Dr. Marla Roe who is referring her to another orthopedic surgeon stating that she is too booked up to take her to the operating room now. Was  still using a wound VAC on the foot 8/3 -Follow-up after HBO, she is got another week of linezolid, she is to call [facility] for an appointment, x-rays of both feet were reviewed, the left foot x-ray with third MTP joint osteo- Right foot x-ray widespread osteo-in the right midfoot Right ankle x-ray does not show any active evidence of infection 8/11-Patient is seen after HBO, the wounds on the right foot appear to be about the same, the heel wound had some necrotic base over tendon that was debrided with a curette 8/21; patient is seen after HBO. The patient's wound on her dorsal foot actually looks reasonably good and there is substantial amount of epithelialization however the open area distally still has a lot of necrotic debris partially bone. I cannot really get a good sense of just how deep this probes under the foot. She has been pressuring me this week to order medical maggots through a company in [place] for her. The problem I have is there is not a defined wound area here. On the positive side there is no purulence. She has been to see infectious disease she is still on Septra DS although I have not had a chance to review their notes 8/28; patient is seen in conjunction with HBO. The wounds on her foot continued to improve including the right dorsal foot substantially the, the distal part of this wound and the area on the right heel. We have been using a wound VAC over this chronically. She is still on trimethoprim as directed by infectious disease 9/4; patient is seen in conjunction with HBO. Right dorsal foot wound substantially anteriorly is better however she continues to have a deep wound in the distal  part of this that is not responding. We have been using silver collagen under border foam ooArea on the right plantar medial heel seems better. We have been using Hydrofera Blue 12/12/18 on evaluation today patient appears to be doing about the same with regard to her wound based on prior  measurements. She does have some necrotic tissue noted on the lateral aspect of the wound that is going require a little bit of sharp debridement today. This includes what appears to be potentially either severely necrotic bone or tendon. Nonetheless other than that she does not appear to have any severe infection which is good news 9/18; it is been 2 weeks since I saw this wound. She is tolerating HBO well. Continued dramatic improvement in the area on the right dorsal foot. She still has a small wound on the heel that we have been using Hydrofera Blue. She continues with a wound VAC 9/24; patient has to be seen emergently today with a swelling on her right lateral lower leg. She says that she told Dr. Evette Doffing about this and also myself on a couple of occasions but I really have no recollection of this. She is not systemically unwell and her wound really looked good the last time I saw this. She showed this to providers at dialysis and she was able to verify that she was started on cephalexin today for 5 doses at dialysis. She dialyzes on Tuesday Thursday and Saturday. 10/2; patient is seen in conjunction with HBO. The area that is draining on the right anterior medial tibia is more extensive. Copious amounts of serosanguineous drainage with some purulence. We are still using the wound VAC on the original wound then it is stable. Culture I did of the original IandD showed MRSA I contacted dialysis she is now on vancomycin with dialysis treatments. I asked them to run a month 10/9; patient seen in conjunction with HBO. She had a new spontaneous open area just above the wound on the right medial tibia ankle. More swelling on the right medial tibia. Her wound on the foot looks about the same perhaps slightly better. There is no warmth spreading up her leg but no obvious erythema. her MRI of the foot and ankle and distal tib-fib is not booked for next Friday I discussed this with her in great detail  over multiple days. it is likely she has spreading infection upper leg at least involving the distal 25% above the ankle. She knows that if I refer her to orthopedics for infectious disease they are going to recommend amputation and indeed I am not against this myself. We had a good trial at trying to heal the foot which is what she wanted along with antibiotics debridement and HBO however she clearly has spreading infection [probably staph aureus/MRSA]. Nevertheless she once again tells me she wants to wait the left of the MRI. She still makes comments about having her amputation done in [place]. 10/19; arrives today with significant swelling on the lateral right leg. Last culture I did showed Klebsiella. Multidrug-resistant. Cipro was intermediate sensitivity and that is what I have her on pending her MRI which apparently is going to be done on Thursday this week although this seems to be moving back and forth. She is not systemically unwell. We are using silver alginate on her major wound area on the right medial foot and the draining areas on the right lateral lower leg 10/26; MRI showed extensive abscess in the anterior compartment of the right leg  also widespread osteomyelitis involving osseous structures of the midfoot and portions of the hindfoot. Also suspicion for osteomyelitis anterior aspect of the distal medial malleolus. Culture I did of the purulence once again showed a multidrug-resistant Klebsiella. I have been in contact with nephrology late last week and she has been started on cefepime at dialysis to replace the vancomycin We sent a copy of her MRI report to Dr. Geroge Baseman in [place] who is an orthopedic surgeon. The patient takes great stock in his opinion on this. She says she will go to [place] to have her leg amputated if Dr. Geroge Baseman does not feel there is any salvage options. 11/2; she still is not talk to her orthopedic surgeon in [place]. Apparently he will call  her at [phone] this afternoon. The quality of this is she has not allowed me to refer her anywhere. She has been told over and over that she needs this amputated but has not agreed to be referred. She tells me her blood sugar was 600 last night but she has not been febrile. 11/9; she never did got a call from the orthopedic surgeon in [place] therefore that is off the radar. We have arranged to get her see orthopedic surgery at [facility]. She still has a lot of draining purulence coming out of the new abscess in her right leg although that probably came from the osteomyelitis in her right foot and heel. Meanwhile the original wound on the right foot looks very healthy. Continued improvement. The issue is that the last MRI showed osteomyelitis in her right foot extensively she now has an abscess in the right anterior lower leg. There is nobody in [place] who will offer this woman anything but an amputation and to be honest that is probably what she needs. I think she still wants to talk about limb salvage although at this point I just do not see that. She has completed her vancomycin at dialysis which was for the original staph aureus she is still on cefepime for the more recent Klebsiella. She has had a long course of both of these antibiotics which should have benefited the osteomyelitis on the right foot as well as the abscess. 11/16; apparently [place] elective surgery is shut down because of COVID-19 pandemic. I have reached out to some contacts at [facility] to see if we can get her an orthopedic appointment there. I am concerned about continually leaving this but for the moment everything is static. In fact her original large wound on this foot is closing down. It is the abscess on the right anterior leg that continues to drain purulent serosanguineous material. She is not currently on any antibiotics however she had a prolonged course of vancomycin [1 month] as well as cefepime for a  month 02/24/2019 on evaluation today patient appears to be doing better than the last time I saw her. This is not a patient that I typically see. With that being said I am covering for Dr. Dellia Nims this week and again compared to when I last saw her overall the wounds in particular seem to be doing significantly better which is good news. With that being said the patient tells me several disconcerting things. She has not been able to get in to see anyone for potential debridement in regard to her leg wounds although she tells me that she does not think it is necessary any longer because she is taking care of that herself. She noticed a string coming out of the lower wound on  her leg over the last week. The patient states that she subsequently decided that we must of pack something in there and started pulling the string out and as it kept coming and coming she realized this was likely her tendon. With that being said she continued to remove as much of this as she could. She then I subsequently proceeded to using tubes of antibiotic ointment which she will stick down into the wound and then scored as much as she can until she sees it coming out of the other wound opening. She states that in doing this she is actually made things better and there is less redness and irritation. With regard to her foot wound she does have some necrotic tendon and tissue noted in one small corner but again the actual wound itself seems to be doing better with good granulation in general compared to my last evaluation. 12/7; continued improvement in the wound on the substantial part of the right medial foot. Still a necrotic area inferiorly that required debridement but the rest of this looks very healthy and is contracting. She has 2 wounds on the right lateral leg which were her original drainage sites from her abscess but all of this looks a lot better as well. She has been using silver alginate after putting antibiotic  biotic ointment in one wound and watching it come out the other. I have talked to her in some detail today. I had given her names of orthopedic surgeons at [facility] for second opinion on what to do about the right leg. I do not think the patient never called them. She has not been able to get a hold of the orthopedic surgeon in [place] that she had put a lot of faith in as being somebody would give her an opinion that she would trust. I talked to her today and said even if I could get her in to another orthopedic surgeon about the leg which she accept an amputation and she said she would not therefore I am not going to press this issue for the moment 12/14; continued improvement in his substantial wound on the right medial foot. There is still a necrotic area inferiorly with tightly adherent necrotic debris which I have been working on debriding each time she is here. She does not have an orthopedic appointment. Since last time she was here I looked over her cultures which were essentially MRSA on the foot wound and gram-negative rods in the abscess on the anterior leg. 12/21; continued improvement in the area on the right medial foot. She is not up on this much and that is probably a good thing since I do not know it could support continuous ambulation. She has a small area on the right lateral leg which were remanence of the IandD's I did because of the abscess. I think she should probably have prophylactic antibiotics I am going to have to look this over to see if we can make an intelligent decision here. In the meantime her major wound is come down nicely. Necrotic area inferiorly is still there but looks a lot better 04/06/2019; she has had some improvement in the overall surface area on the right medial foot somewhat narrowedr both but somewhat longer. The areas on the right lateral leg which were initial IandD sites are superficial. Nothing is present on the right heel. We are using silver  alginate to the wound areas 1/18; right medial foot somewhat smaller. Still a deep probing area in the most  distal recess of the wound. She has nothing open on the right leg. She has a new wound on the plantar aspect of her left fourth toe which may have come from just pulling skin. The patient using Medihoney on the wound on her foot under silver alginate. I cannot discourage her from this 2/1; 2-week follow-up using silver alginate on the right foot and her left fourth toe. The area on the right dorsal foot is contracted although there is still the deep area in the most distal part of the wound but still has some probing depth. No overt infection 2/15; 2-week follow-up. She continues to have improvement in the surface area on the dorsal right foot. Even the tunneling area from last time is almost closed. The area that was on the plantar part of her left fourth toe over the PIP is indeed closed 3/1; 2-week follow-up. Continued improvement in surface area. The original divot that we have been debriding inferiorly I think has full epithelialization although the epithelialization is gone down into the wound with probably 4 mm of depth. Even under intense illumination I am unable to see anything open here. The remanence of the wound in this area actually look quite healthy. We have been using silver alginate 3/15; 2-week follow-up. Unfortunately not as good today. She has a comma shaped wound on the dorsal foot however the upper part of this is larger. Under illumination debris on the surface She also tells [place] that she was on her right leg 2 times in the last couple of weeks mostly to reach up for things above her head etc. She felt a sharp pain in the right leg which she thinks is somewhere from the ankle to the knee. The patient has neuropathy and is really uncertain. She cannot feel her foot so she does not think it was coming from there 3/29; 2-week follow-up. Her wound measures smaller. Surface of  the wound appears reasonable. She is using silver alginate with underlying Medihoney. She has home health. X-rays I did of her tib-fib last time were negative although it did show arterial calcification 4/12; 2-week follow-up. Her wound measures smaller in length. Using manuka honey with silver alginate on top. She has home health. 4/26; 2-week follow-up. Her wound is smaller but still very adherent debris under illumination requiring debridement she has been using manuka honey with silver alginate. 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 Been using Hydrofera Blue. She is on suppressive Bactrim but apparently she has been taking it twice daily Objective Constitutional alert and oriented x 3. sitting or standing blood pressure is within target range for patient.. supine blood pressure is within target range for patient.. pulse regular and within target range for patient.Marland Kitchen respirations regular, non-labored and within target range for patient.Marland Kitchen temperature within target range for patient.. Well- nourished and well-hydrated in no acute distress. Vitals Time Taken: 3:41 PM, Height: 67 in, Weight: 125 lbs, BMI: 19.6, Temperature: 98.4 F, Pulse: 90 bpm, Respiratory Rate: 18 breaths/min, Blood Pressure: 125/63 mmHg, Capillary Blood Glucose: 205 mg/dl. General Notes: Right forefoot transmet wound with a rim of eschar under hematoma on the medial aspect, this was debrided and removed with pickup and scalpel Integumentary (Hair, Skin) Wound #43 status is Open. Original cause of wound was Gradually Appeared. The wound is located on the Right,Medial Foot. The wound measures 3.4cm length x 4.9cm width  x 0.1cm depth; 13.085cm^2 area and 1.308cm^3 volume. There is muscle and Fat Layer (Subcutaneous Tissue) Exposed exposed. There is no tunneling or undermining noted. There is a medium amount of  serosanguineous drainage noted. The wound margin is flat and intact. There is medium (34- 66%) pink, pale granulation within the wound bed. There is a medium (34-66%) amount of necrotic tissue within the wound bed including Eschar and Adherent Slough. Assessment Active Problems ICD-10 Other chronic osteomyelitis, right ankle and foot Non-pressure chronic ulcer of other part of right foot with necrosis of bone Type 1 diabetes mellitus with foot ulcer Procedures Wound #43 Pre-procedure diagnosis of Wound #43 is a Diabetic Wound/Ulcer of the Lower Extremity located on the Right,Medial Foot .Severity of Tissue Pre Debridement is: Fat layer exposed. There was a Excisional Skin/Subcutaneous Tissue Debridement with a total area of 4 sq cm performed by Nancy Bastos, MD. With the following instrument(s): Blade, and Forceps to remove Viable and Non-Viable tissue/material. Material removed includes Eschar and Subcutaneous Tissue and after achieving pain control using Other (benzocaine, 20%). No specimens were taken. A time out was conducted at 16:00, prior to the start of the procedure. There was no bleeding. The procedure was tolerated well with a pain level of 0 throughout and a pain level of 0 following the procedure. Post Debridement Measurements: 3.4cm length x 4.9cm width x 0.1cm depth; 1.308cm^3 volume. Character of Wound/Ulcer Post Debridement is improved. Severity of Tissue Post Debridement is: Fat layer exposed. Post procedure Diagnosis Wound #43: Same as Pre-Procedure Plan Follow-up Appointments: Return Appointment in 2 weeks. Dressing Change Frequency: Wound #43 Right,Medial Foot: Change dressing three times week. Wound Cleansing: Clean wound with Wound Cleanser - Anasept to all wounds Primary Wound Dressing: Wound #43 Right,Medial Foot: Hydrofera Blue - Classic - moistened with saline Secondary Dressing: Wound #43 Right,Medial Foot: Kerlix/Rolled Gauze Dry Gauze Edema  Control: Elevate legs to the level of the heart or above for 30 minutes daily and/or when sitting, a frequency of: - throughout the day Home Health: [facility] for wound care. - Interim -Continue Hydrofera Blue as primary dressing to this wound on the right forefoot patient advised to take all precautions to avoid any kind of trauma or pressure on this area -Return to clinic in 2 weeks Electronic Signature(s) Signed: 11/09/2019 8:15:58 AM By: Nancy Bastos MD, MBA Previous Signature: 08/28/2019 4:02:38 PM Version By: Nancy Bastos MD, MBA Entered By: [name] on 11/09/2019 08:15:57 -------------------------------------------------------------------------------- SuperBill Details Patient Name: Date of Service: Nancy LLO [name] 08/28/2019 Medical Record Number: [id number] Patient Account Number: [id number] Date of Birth/Sex: Treating RN: 01/28/1971 (49 y.o. F) [name], Nancy Primary Care Provider: Sanjuana [name], [name] Other Clinician: Referring Provider: Treating Provider/Extender: [name] MO REIRA, [name] Weeks in Treatment: 89 Diagnosis Coding ICD-10 Codes Code Description [phone] Other chronic osteomyelitis, right ankle and foot L97.514 Non-pressure chronic ulcer of other part of right foot with necrosis of bone E10.621 Type 1 diabetes mellitus with foot ulcer Facility Procedures CPT4 Code: [id number] IC L Description: 81157 - DEB SUBQ TISSUE 20 SQ CM/< D-10 Diagnosis Description 97.514 Non-pressure chronic ulcer of other part of right foot with necrosis of bone Modifier: Quantity: 1 Physician Procedures : CPT4 Code Description Modifier [id number] 11042 - WC PHYS SUBQ TISS 20 SQ CM ICD-10 Diagnosis Description L97.514 Non-pressure chronic ulcer of other part of right foot with necrosis of bone Quantity: 1 Electronic Signature(s) Signed: 08/28/2019 4:06:37 PM By: Nancy Bastos MD, MBA  Entered By: [name] on 08/28/2019  16:06:36

## 2019-09-01 ENCOUNTER — Other Ambulatory Visit: Payer: Self-pay

## 2019-09-01 ENCOUNTER — Encounter: Payer: Self-pay | Admitting: Endocrinology

## 2019-09-01 ENCOUNTER — Ambulatory Visit (INDEPENDENT_AMBULATORY_CARE_PROVIDER_SITE_OTHER): Payer: Medicare HMO | Admitting: Endocrinology

## 2019-09-01 VITALS — BP 110/62 | HR 72 | Ht 67.5 in | Wt 129.9 lb

## 2019-09-01 DIAGNOSIS — N186 End stage renal disease: Secondary | ICD-10-CM | POA: Diagnosis not present

## 2019-09-01 DIAGNOSIS — Z89431 Acquired absence of right foot: Secondary | ICD-10-CM | POA: Diagnosis not present

## 2019-09-01 DIAGNOSIS — E1065 Type 1 diabetes mellitus with hyperglycemia: Secondary | ICD-10-CM

## 2019-09-01 DIAGNOSIS — Z992 Dependence on renal dialysis: Secondary | ICD-10-CM | POA: Diagnosis not present

## 2019-09-01 NOTE — Patient Instructions (Signed)
BOLUSES: Make sure you bolus at least half of your mealtime boluses before starting to eat and the rest as soon as you finish. Bolus for all meals and snacks Make sure you have a protein with every meal  If the blood sugar is consistently over 250 for more than an hour take a correction bolus.  If your blood sugar is consistently over 350 for over 4 hours change your infusion set  CORRECTION factor will be I: 65 instead of 70   If blood sugars are consistently over 200 during the night increase the basal rate between midnight and 4 AM to 0.35

## 2019-09-01 NOTE — Progress Notes (Signed)
Patient ID: NATHAN STALLWORTH, female   DOB: November 01, 1970, 49 y.o.   MRN: [id number]   Reason for Appointment: follow-up of diabetes  History of Present Illness   Diagnosis: Type 1 DIABETES MELITUS  Previous history: She has had long-standing poorly controlled diabetes and typically has poor compliance with self care measures despite periodic diabetes education and periodic followup  INSULIN PUMP regimen:   Current insulin pump: T-insulin as of 07/20/2019   Basal rates: 0.3 all 24 hours  Previous settings: Midnight to 6 AM = 0.6, 3 AM = 0.5, 6 AM-9 AM: 0.55, 9 AM-1 PM = 0. 3, 1 PM-3 PM = 0.3, 3 PM- 7 PM = 0.75, 7 PM-12 AM = 0.85   Carb coverage 1: 20 g and correction 1: 70 Active insulin 4 hours CGM alerts: 70, 300         Recent history:   Her last A1c was 11.7  Current blood sugar patterns, difficulties with management and problems identified:   She has refused to increase her basal rates because of fear of hypoglycemia  She also claimed that she forgets to bolus for her meals and will not cover her meals and snacks usually  Her bolus insulin in the last 2 weeks is only averaging 2-3 units/day  In the last week her blood sugars are more consistently high through the night although did have 1 night but blood sugars were near normal  She says awaking and sleeping times are highly variable  With using the exercise setting when she goes to dialysis she has about a low blood sugars  However today at dialysis she did not use this setting and was okay  She will only randomly bolus after the meal and not before she is eating since she does not always finish her meal  However she does not usually do correction boluses when blood sugars are persistently high  Only once she had significant response to a correction bolus while blood sugar was near 400 otherwise correction boluses do not appear to be adequate  She says she went on vacation and even when she had high  sugars around 400 for couple of days she did not change her infusion set since she did not have anything extra, also her insulin went bad in the hot car and had to get another supply   CONTINUOUS GLUCOSE MONITORING data, average overall for 2 weeks and segment averages for the last week:  PRE-MEAL  overnight  morning  afternoon  evening Overall  Glucose range:      82-400  Mean/median:  279  158  217  248  238+/-97      Meals: eating at variable times and also having snacks periodically.  Her daily carbohydrate intake is quite variable Physical activity: exercise: none         Dietician visit: Most recent:?          Complications: are: Neuropathy, nephropathy, diabetic foot ulcer, amputation    Wt Readings from Last 3 Encounters:  09/01/19 129 lb 14.4 oz (58.9 kg)  08/06/19 124 lb 3.2 oz (56.3 kg)  07/21/19 125 lb 12.8 oz (57.1 kg)   Lab Results  Component Value Date   HGBA1C 11.7 (A) 07/21/2019   HGBA1C 9.3 (A) 02/04/2019   HGBA1C 11.4 (A) 10/15/2018   Lab Results  Component Value Date   MICROALBUR 304.2 (H) 11/11/2017   LDLCALC 78 05/02/2016   CREATININE 3.70 (H) 04/29/2019   No visits with results within  1 Week(s) from this visit.  Latest known visit with results is:  Office Visit on 07/21/2019  Component Date Value Ref Range Status   Hemoglobin A1C 07/21/2019 11.7* 4.0 - 5.6 % Final    Other problems addressed:: See review of systems   Allergies as of 09/01/2019      Reactions   Cleocin [clindamycin Hcl] Diarrhea   Lisinopril Other (See Comments)   Elevated potassium per pt report   Amoxicillin Diarrhea, Nausea Only, Other (See Comments)   Did it involve swelling of the face/tongue/throat, SOB, or low BP? No Did it involve sudden or severe rash/hives, skin peeling, or any reaction on the inside of your mouth or nose? No Did you need to seek medical attention at a hospital or doctor's office? No When did it last happen?10 + years If all above answers are  "NO", may proceed with cephalosporin use.   Bactrim [sulfamethoxazole-trimethoprim] Diarrhea, Nausea Only      Medication List       Accurate as of September 01, 2019  3:12 PM. If you have any questions, ask your nurse or doctor.        STOP taking these medications   oxyCODONE-acetaminophen 5-325 MG tablet Commonly known as: PERCOCET/ROXICET Stopped by: Elayne Snare, MD     TAKE these medications   albuterol 108 (90 Base) MCG/ACT inhaler Commonly known as: VENTOLIN HFA Inhale 2 puffs into the lungs every 6 (six) hours as needed for wheezing or shortness of breath.   aspirin EC 81 MG tablet Take 81 mg by mouth daily.   B-12 5000 MCG Caps Take 5,000 mcg by mouth daily.   calcitRIOL 0.5 MCG capsule Commonly known as: ROCALTROL Take 1 capsule daily What changed:   how much to take  how to take this  when to take this  additional instructions   calcium acetate 667 MG capsule Commonly known as: PHOSLO Take 2 capsules (1,334 mg total) by mouth 3 (three) times daily with meals.   Contour Next Test test strip Generic drug: glucose blood USE AS INSTRUCTED TO CHECK BLOOD SUGAR 4 TIMES A DAY. DX E10.65   Dexcom G6 Receiver Devi 1 each by Does not apply route See admin instructions. Use Dexcom Receiver to monitor blood sugar continuously.   Dexcom G6 Sensor Misc 1 each by Does not apply route See admin instructions. Use one sensors once every 10 days to monitor blood sugars.   Dexcom G6 Transmitter Misc 1 each by Does not apply route See admin instructions. Use one transmitter once every 90 days.   DIALYVITE 800 WITH ZINC 0.8 MG Tabs Take 1 tablet by mouth daily.   diphenhydrAMINE 25 MG tablet Commonly known as: BENADRYL Take 25 mg by mouth daily as needed.   folic acid 675 MCG tablet Commonly known as: FOLVITE Take 400 mcg by mouth daily.   insulin aspart 100 UNIT/ML injection Commonly known as: NovoLOG USE A MAX OF 65 UNITS DAILY VIA INSULIN PUMP.   insulin pump  Soln Inject into the skin. Insulin aspart (Novolog) 100 unit/ml---MAX 65 UNITS DAILY   norethindrone 5 MG tablet Commonly known as: AYGESTIN Take 5 mg by mouth See admin instructions.   Omega 3 500 500 MG Caps Take 500 mg by mouth daily.   ondansetron 4 MG tablet Commonly known as: ZOFRAN Take 4 mg by mouth every 8 (eight) hours as needed for nausea or vomiting.   pravastatin 40 MG tablet Commonly known as: PRAVACHOL TAKE 1 TABLET BY MOUTH  EVERY DAY   sulfamethoxazole-trimethoprim 800-160 MG tablet Commonly known as: BACTRIM DS Take 1 tablet by mouth daily.       Allergies:  Allergies  Allergen Reactions   Cleocin [Clindamycin Hcl] Diarrhea   Lisinopril Other (See Comments)    Elevated potassium per pt report   Amoxicillin Diarrhea, Nausea Only and Other (See Comments)    Did it involve swelling of the face/tongue/throat, SOB, or low BP? No Did it involve sudden or severe rash/hives, skin peeling, or any reaction on the inside of your mouth or nose? No Did you need to seek medical attention at a hospital or doctor's office? No When did it last happen?10 + years If all above answers are "NO", may proceed with cephalosporin use.     Bactrim [Sulfamethoxazole-Trimethoprim] Diarrhea and Nausea Only    Past Medical History:  Diagnosis Date   Anemia    Arthritis    Bronchitis    C. difficile diarrhea 09/26/2014   Cataracts, both eyes    had surgery to remove   Cellulitis of right foot 06/02/2014   CKD (chronic kidney disease) stage 3, GFR 30-59 ml/min    Dialysis T, TH, Sat   Diabetic ulcer of right foot ([place]) 06/02/2014   DVT (deep venous thrombosis) ([place]) 10/2014   one in each one in leg- PICC line , one in right and left arm   H/O seasonal allergies    Heart murmur    told once when she was pregnant- no furter mention- was in notes 11/2015- had Echo 8 /4/17   History of blood transfusion    Hypercholesteremia    Hypertension    Left foot  infection    Neuromuscular disorder (HCC)    lower legs and feet   Neuropathy    feet   Neuropathy in diabetes (HCC)    Peripheral vascular disease (HCC)    Sleep apnea    does not use cpap   Staphylococcus aureus bacteremia 10/2014   Type 1 diabetes ([place])    onset age 28    Past Surgical History:  Procedure Laterality Date   AMPUTATION TOE Left 07/24/2015   5th toe and Hallux amputation   AV FISTULA PLACEMENT Left 08/06/2018   Procedure: INSERTION OF BASCILIC VEN TRANSPOSITION 1ST STAGE;  Surgeon: Rosetta Posner, MD;  Location: [facility];  Service: Vascular;  Laterality: Left;   [place] Left 04/29/2019   Procedure: SECOND STAGE BASCILIC VEIN TRANSPOSITION LEFT ARM;  Surgeon: Rosetta Posner, MD;  Location: [facility];  Service: Vascular;  Laterality: Left;   CESAREAN SECTION     x 1   EXOSTECTECTOMY TOE Left 04/11/2016   Procedure: EXOSTECTECTOMY CUBOID AND 5TH METATARSAL AND PLACEMENT OF ANTIBIOTIC BEADS;  Surgeon: Edrick Kins, DPM;  Location: [place];  Service: Podiatry;  Laterality: Left;   EYE SURGERY Bilateral    removed cataracts - laser   FEMORAL-POPLITEAL BYPASS GRAFT Left 05/03/2015   FOOT AMPUTATION THROUGH METATARSAL Right 2016   hemrrhoidectomy     I & D EXTREMITY Right 06/10/2014   Procedure: IRRIGATION AND DEBRIDEMENT Right Foot;  Surgeon: Newt Minion, MD;  Location: [facility];  Service: Orthopedics;  Laterality: Right;   I & D EXTREMITY Right 07/31/2018   Procedure: IRRIGATION AND DEBRIDEMENT EXTREMITY;  Surgeon: Edrick Kins, DPM;  Location: [place];  Service: Podiatry;  Laterality: Right;   I & D EXTREMITY Right 08/04/2018   Procedure: IRRIGATION AND DEBRIDEMENT FOOT with placement of wound vac;  Surgeon: Edrick Kins, DPM;  Location: [facility];  Service: Podiatry;  Laterality: Right;   INCISION AND DRAINAGE Left 04/11/2016   Procedure: INCISION AND DRAINAGE A DEEP COMPLICATED WOUND OF LEFT FOOT;  Surgeon: Edrick Kins, DPM;  Location: [facility];   Service: Podiatry;  Laterality: Left;   INSERTION OF DIALYSIS CATHETER Right 08/06/2018   Procedure: INSERTION OF TUNNELED DIALYSIS CATHETER;  Surgeon: Rosetta Posner, MD;  Location: [facility];  Service: Vascular;  Laterality: Right;   IR FLUORO GUIDE CV LINE RIGHT  09/24/2017   IR FLUORO GUIDE CV LINE RIGHT  08/01/2018   IR REMOVAL TUN CV CATH W/O FL  11/22/2017   IR US GUIDE VASC ACCESS RIGHT  09/24/2017   IR US GUIDE VASC ACCESS RIGHT  08/01/2018   IRRIGATION AND DEBRIDEMENT ABSCESS Left 05/06/2017   Procedure: IRRIGATION AND DEBRIDEMENT ABSCESS LEFT FOOT;  Surgeon: Edrick Kins, DPM;  Location: [facility];  Service: Podiatry;  Laterality: Left;   PERIPHERALLY INSERTED CENTRAL CATHETER INSERTION     SKIN GRAFT Right 06/15/2014   SKIN GRAFT Left 06/2015   foot    SKIN SPLIT GRAFT Right 06/15/2014   Procedure: SPLIT THICKNESS SKIN GRAFT RIGHT FOOT;  Surgeon: Newt Minion, MD;  Location: [facility] [place];  Service: Orthopedics;  Laterality: Right;   TRANSMETATARSAL AMPUTATION Right    TRANSMETATARSAL AMPUTATION Right 09/11/2014   WISDOM TOOTH EXTRACTION      Family History  Problem Relation Age of Onset   Hyperlipidemia Mother    Dementia Mother     Social History:  reports that she quit smoking about 11 years ago. She quit after 15.00 years of use. She has never used smokeless tobacco. She reports previous alcohol use. She reports that she does not use drugs.  Review of Systems:   Hypertension/CKD:   Is on dialysis  Currently not on any blood pressure medications  BP Readings from Last 3 Encounters:  09/01/19 110/62  08/06/19 110/68  07/21/19 140/74      Lipids: Has been  treated with pravastatin 40 mg  Followed by PCP   Lab Results  Component Value Date   CHOL 152 06/12/2018   HDL 24.40 (L) 06/12/2018   LDLCALC 78 05/02/2016   LDLDIRECT 82.0 06/12/2018   TRIG 222.0 (H) 06/12/2018   CHOLHDL 6 06/12/2018      Chronic anemia, followed by nephrologist and PCP:  Lab  Results  Component Value Date   WBC 6.1 11/10/2018   HGB 11.2 (L) 04/29/2019   HCT 33.0 (L) 04/29/2019   MCV 73.9 (L) 11/10/2018   PLT 370 11/10/2018    She has a Charcot foot, has had amputation on the left and currently still having incomplete healing     Examination:   BP 110/62 (BP Location: Right Arm, Patient Position: Sitting, Cuff Size: Normal)    Pulse 72    Ht 5' 7.5" (1.715 m)    Wt 129 lb 14.4 oz (58.9 kg)    SpO2 97%    BMI 20.04 kg/m   Body mass index is 20.04 kg/m.      ASSESSMENT/ PLAN:   Diabetes type 1 with poor control and multiple complications      See history of present illness for detailed discussion of current diabetes management, blood sugar patterns and problems identified  A1c is last 11.7  She has continued the closed-loop insulin pump with the T-Slim device  CGM and blood sugar patterns, bolus events were reviewed in detail  Blood sugars are still difficult to control because mostly because of her difficulty with following instructions and doing the necessary actions especially boluses Also her A1c was 11.7 her average blood sugar recently is about 240, this also represents some days when she was on vacation and blood sugars running high for consistently a couple of days   PLAN:     Carbohydrate ratio 1:20 to be continued  Although she needs a higher basal rate during the night she refuses to increase it  Continue to try exercise mode when she is going for dialysis  Bolus consistently at mealtimes as discussed, may do partial boluses before eating  Rotate infusion sites and change infusion sets every 3 to 4 days  She will reduce correction factor of 1: 70 down to 1: 65 for now  Patient Instructions  BOLUSES: Make sure you bolus at least half of your mealtime boluses before starting to eat and the rest as soon as you finish. Bolus for all meals and snacks Make sure you have a protein with every meal  If the blood sugar is  consistently over 250 for more than an hour take a correction bolus.  If your blood sugar is consistently over 350 for over 4 hours change your infusion set  CORRECTION factor will be I: 65 instead of 70   If blood sugars are consistently over 200 during the night increase the basal rate between midnight and 4 AM to 0.35        Tyrrell Stephens 09/01/2019, 3:12 PM      Elayne Snare 09/01/19

## 2019-09-03 NOTE — Progress Notes (Signed)
[name], [name] ([id number]) Visit Report for 08/28/2019 Arrival Information Details Patient Name: Date of Service: [place] LLO Peggye Fothergill 08/28/2019 3:30 PM Medical Record Number: [id number] Patient Account Number: [id number] Date of Birth/Sex: Treating RN: Aug 15, 1970 (49 y.o. F) Dwiggins, Shannon Primary Care Swayzee Wadley: [name], [name] Other Clinician: Referring Shonita Rinck: Treating [name] [name]/Extender: Tobi Bastos MO REIRA, [name] Weeks in Treatment: 48 Visit Information History Since Last Visit Added or deleted any medications: No Patient Arrived: Wheel Chair Any new allergies or adverse reactions: No Arrival Time: 15:38 Had a fall or experienced change in No Accompanied By: self activities of daily living that may affect Transfer Assistance: None risk of falls: Patient Identification Verified: Yes Signs or symptoms of abuse/neglect since last visito No Secondary Verification Process Completed: Yes Hospitalized since last visit: No Patient Requires Transmission-Based Precautions: No Implantable device outside of the clinic excluding No Patient Has Alerts: No cellular tissue based products placed in the center since last visit: Has Dressing in Place as Prescribed: Yes Pain Present Now: No Electronic Signature(s) Signed: 09/03/2019 9:12:52 AM By: Sandre Kitty Entered By: Sandre Kitty on 08/28/2019 15:39:15 -------------------------------------------------------------------------------- Lower Extremity Assessment Details Patient Name: Date of Service: [name] 08/28/2019 3:30 PM Medical Record Number: [id number] Patient Account Number: [id number] Date of Birth/Sex: Treating RN: 1970/04/19 (49 y.o. [name] Primary Care Shatiqua Heroux: [name], [name] Other Clinician: Referring Jaquavis Felmlee: Treating Artemio Dobie/Extender: Tobi Bastos MO REIRA, [name] Weeks in Treatment: 51 Edema Assessment Assessed: [Left: No] [Right: No] Edema: [Left: Ye]  [Right: s] Calf Left: Right: Point of Measurement: cm From Medial Instep cm 28 cm Ankle Left: Right: Point of Measurement: cm From Medial Instep cm 18.5 cm Vascular Assessment Pulses: Dorsalis Pedis Palpable: [Right:Yes] Electronic Signature(s) Signed: 08/28/2019 4:59:31 PM By: [name] Entered By: [name] on 08/28/2019 15:57:52 -------------------------------------------------------------------------------- Multi-Disciplinary Care Plan Details Patient Name: Date of Service: [name] 08/28/2019 3:30 PM Medical Record Number: [id number] Patient Account Number: [id number] Date of Birth/Sex: Treating RN: 1970/04/24 (49 y.o. F) [name] Primary Care Chet Greenley: [name], [name] Other Clinician: Referring Marija Calamari: Treating Kimberla Driskill/Extender: Tobi Bastos MO REIRA, [name] Weeks in Treatment: 69 Active Inactive Wound/Skin Impairment Nursing Diagnoses: Impaired tissue integrity Knowledge deficit related to ulceration/compromised skin integrity Goals: Patient/caregiver will verbalize understanding of skin care regimen Date Initiated: 09/03/2018 Target Resolution Date: 09/25/2019 Goal Status: Active Ulcer/skin breakdown will have a volume reduction of 30% by week 4 Date Initiated: 09/03/2018 Date Inactivated: 10/07/2018 Target Resolution Date: 10/01/2018 Goal Status: Unmet Unmet Reason: COMORBITIES Ulcer/skin breakdown will have a volume reduction of 50% by week 8 Date Initiated: 10/07/2018 Date Inactivated: 11/03/2018 Target Resolution Date: 10/31/2018 Goal Status: Unmet Unmet Reason: Osteomyelitis Interventions: Assess patient/caregiver ability to obtain necessary supplies Assess patient/caregiver ability to perform ulcer/skin care regimen upon admission and as needed Assess ulceration(s) every visit Provide education on ulcer and skin care Treatment Activities: Skin care regimen initiated : 09/03/2018 Topical wound management initiated :  09/03/2018 Notes: Electronic Signature(s) Signed: 08/28/2019 4:59:31 PM By: [name] Entered By: [name] on 08/28/2019 15:53:00 -------------------------------------------------------------------------------- Pain Assessment Details Patient Name: Date of Service: Lorelee New [name] 08/28/2019 3:30 PM Medical Record Number: [id number] Patient Account Number: [id number] Date of Birth/Sex: Treating RN: 12-Jan-1971 (49 y.o. [name] Primary Care Micai Apolinar: [name], [name] Other Clinician: Referring Lenton Gendreau: Treating Cloris Flippo/Extender: Tobi Bastos MO REIRA, [name] Weeks in Treatment: 57 Active Problems  Location of Pain Severity and Description of Pain Patient Has Paino No Site Locations Pain Management and Medication Current Pain Management: Electronic Signature(s) Signed: 08/28/2019 4:59:31 PM By: [name] Signed: 09/03/2019 9:12:52 AM By: Sandre Kitty Entered By: Sandre Kitty on 08/28/2019 15:41:39 -------------------------------------------------------------------------------- Patient/Caregiver Education Details Patient Name: Date of Service: [place] LLO [place] [name] 5/28/2021andnbsp3:30 PM Medical Record Number: [id number] Patient Account Number: [id number] Date of Birth/Gender: Treating RN: 1970-05-22 (49 y.o. [name] Primary Care Physician: [name], [name] Other Clinician: Referring Physician: Treating Physician/Extender: Tobi Bastos MO REIRA, [name] Weeks in Treatment: 71 Education Assessment Education Provided To: Patient Education Topics Provided Wound/Skin Impairment: Handouts: Caring for Your Ulcer Methods: Explain/Verbal Responses: State content correctly Electronic Signature(s) Signed: 08/28/2019 4:59:31 PM By: [name] Entered By: [name] on 08/28/2019 15:53:16 -------------------------------------------------------------------------------- Wound Assessment  Details Patient Name: Date of Service: [name] 08/28/2019 3:30 PM Medical Record Number: [id number] Patient Account Number: [id number] Date of Birth/Sex: Treating RN: 1970/05/10 (49 y.o. F) Dwiggins, Shannon Primary Care Jakalyn Kratky: [name], [name] Other Clinician: Referring Najwa Spillane: Treating Cornel Werber/Extender: Tobi Bastos MO REIRA, [name] Weeks in Treatment: 51 Wound Status Wound Number: 43 Primary Diabetic Wound/Ulcer of the Lower Extremity Etiology: Wound Location: Right, Medial Foot Wound Open Wounding Event: Gradually Appeared Status: Date Acquired: 08/04/2018 Comorbid Cataracts, Chronic sinus problems/congestion, Anemia, Sleep Weeks Of Treatment: 51 History: Apnea, Deep Vein Thrombosis, Hypertension, Peripheral Arterial Clustered Wound: Yes Disease, Type I Diabetes, Osteoarthritis, Osteomyelitis, Neuropathy, Seizure Disorder Wound Measurements Length: (cm) 3.4 Width: (cm) 4.9 Depth: (cm) 0.1 Clustered Quantity: 1 Area: (cm) 13.085 Volume: (cm) 1.308 % Reduction in Area: 82% % Reduction in Volume: 94% Epithelialization: Medium (34-66%) Tunneling: No Undermining: No Wound Description Classification: Grade 4 Wound Margin: Flat and Intact Exudate Amount: Medium Exudate Type: Serosanguineous Exudate Color: red, brown Foul Odor After Cleansing: No Slough/Fibrino Yes Wound Bed Granulation Amount: Medium (34-66%) Exposed Structure Granulation Quality: Pink, Pale Fascia Exposed: No Necrotic Amount: Medium (34-66%) Fat Layer (Subcutaneous Tissue) Exposed: Yes Necrotic Quality: Eschar, Adherent Slough Tendon Exposed: No Muscle Exposed: Yes Necrosis of Muscle: No Joint Exposed: No Bone Exposed: No Electronic Signature(s) Signed: 08/28/2019 4:59:31 PM By: [name] Entered By: [name] on 08/28/2019 15:58:27 -------------------------------------------------------------------------------- Vitals Details Patient Name: Date of  Service: [name] 08/28/2019 3:30 PM Medical Record Number: [id number] Patient Account Number: [id number] Date of Birth/Sex: Treating RN: 05/16/1970 (49 y.o. F) Dwiggins, Shannon Primary Care Zhamir Pirro: [name], [name] Other Clinician: Referring Jemmie Rhinehart: Treating Eddy Termine/Extender: Tobi Bastos MO REIRA, [name] Weeks in Treatment: 34 Vital Signs Time Taken: 15:41 Temperature (F): 98.4 Height (in): 67 Pulse (bpm): 90 Weight (lbs): 125 Respiratory Rate (breaths/min): 18 Body Mass Index (BMI): 19.6 Blood Pressure (mmHg): 125/63 Capillary Blood Glucose (mg/dl): 205 Reference Range: 80 - 120 mg / dl Electronic Signature(s) Signed: 09/03/2019 9:12:52 AM By: Sandre Kitty Entered By: Sandre Kitty on 08/28/2019 15:41:31

## 2019-09-07 ENCOUNTER — Ambulatory Visit (HOSPITAL_COMMUNITY)
Admission: RE | Admit: 2019-09-07 | Discharge: 2019-09-07 | Disposition: A | Discharge status: Home / Self Care | Payer: Medicare HMO | Source: Ambulatory Visit | Attending: Internal Medicine | Admitting: Internal Medicine

## 2019-09-07 ENCOUNTER — Other Ambulatory Visit (HOSPITAL_COMMUNITY): Payer: Self-pay

## 2019-09-07 ENCOUNTER — Encounter (HOSPITAL_BASED_OUTPATIENT_CLINIC_OR_DEPARTMENT_OTHER): Payer: Medicare HMO | Attending: Internal Medicine | Admitting: Internal Medicine

## 2019-09-07 ENCOUNTER — Other Ambulatory Visit: Payer: Self-pay

## 2019-09-07 ENCOUNTER — Other Ambulatory Visit (HOSPITAL_COMMUNITY): Payer: Self-pay | Admitting: Internal Medicine

## 2019-09-07 DIAGNOSIS — Z681 Body mass index (BMI) 19 or less, adult: Secondary | ICD-10-CM | POA: Diagnosis not present

## 2019-09-07 DIAGNOSIS — L97529 Non-pressure chronic ulcer of other part of left foot with unspecified severity: Secondary | ICD-10-CM | POA: Diagnosis present

## 2019-09-07 DIAGNOSIS — I129 Hypertensive chronic kidney disease with stage 1 through stage 4 chronic kidney disease, or unspecified chronic kidney disease: Secondary | ICD-10-CM | POA: Diagnosis not present

## 2019-09-07 DIAGNOSIS — Z9641 Presence of insulin pump (external) (internal): Secondary | ICD-10-CM | POA: Diagnosis not present

## 2019-09-07 DIAGNOSIS — E10621 Type 1 diabetes mellitus with foot ulcer: Secondary | ICD-10-CM | POA: Insufficient documentation

## 2019-09-07 DIAGNOSIS — L97812 Non-pressure chronic ulcer of other part of right lower leg with fat layer exposed: Secondary | ICD-10-CM | POA: Diagnosis not present

## 2019-09-07 DIAGNOSIS — L98499 Non-pressure chronic ulcer of skin of other sites with unspecified severity: Secondary | ICD-10-CM

## 2019-09-07 DIAGNOSIS — E46 Unspecified protein-calorie malnutrition: Secondary | ICD-10-CM | POA: Diagnosis not present

## 2019-09-07 DIAGNOSIS — M86671 Other chronic osteomyelitis, right ankle and foot: Secondary | ICD-10-CM | POA: Diagnosis not present

## 2019-09-07 DIAGNOSIS — Z86718 Personal history of other venous thrombosis and embolism: Secondary | ICD-10-CM | POA: Insufficient documentation

## 2019-09-07 DIAGNOSIS — L97514 Non-pressure chronic ulcer of other part of right foot with necrosis of bone: Secondary | ICD-10-CM | POA: Insufficient documentation

## 2019-09-07 DIAGNOSIS — E1042 Type 1 diabetes mellitus with diabetic polyneuropathy: Secondary | ICD-10-CM | POA: Diagnosis not present

## 2019-09-07 DIAGNOSIS — Z9119 Patient's noncompliance with other medical treatment and regimen: Secondary | ICD-10-CM | POA: Insufficient documentation

## 2019-09-07 DIAGNOSIS — Z87891 Personal history of nicotine dependence: Secondary | ICD-10-CM | POA: Diagnosis not present

## 2019-09-07 DIAGNOSIS — L97411 Non-pressure chronic ulcer of right heel and midfoot limited to breakdown of skin: Secondary | ICD-10-CM | POA: Diagnosis not present

## 2019-09-07 DIAGNOSIS — E1069 Type 1 diabetes mellitus with other specified complication: Secondary | ICD-10-CM | POA: Diagnosis not present

## 2019-09-07 DIAGNOSIS — Z89431 Acquired absence of right foot: Secondary | ICD-10-CM | POA: Diagnosis not present

## 2019-09-07 DIAGNOSIS — L97521 Non-pressure chronic ulcer of other part of left foot limited to breakdown of skin: Secondary | ICD-10-CM | POA: Insufficient documentation

## 2019-09-07 DIAGNOSIS — N189 Chronic kidney disease, unspecified: Secondary | ICD-10-CM | POA: Insufficient documentation

## 2019-09-07 NOTE — Progress Notes (Signed)
[name], [name] ([id number]) Visit Report for 09/07/2019 Arrival Information Details Patient Name: Date of Service: Nancy LLO Peggye Fothergill 09/07/2019 2:45 PM Medical Record Number: [id number] Patient Account Number: [id number] Date of Birth/Sex: Treating RN: 1970/07/10 (49 y.o. [name], Meta.Reding Primary Care Rayan Dyal: [name], [name] Other Clinician: Referring Marilyn Wing: Treating Victorya Hillman/Extender: [name], [name] Weeks in Treatment: 11 Visit Information History Since Last Visit Added or deleted any medications: No Patient Arrived: Wheel Chair Any new allergies or adverse reactions: No Arrival Time: 15:29 Had a fall or experienced change in No Accompanied By: self activities of daily living that may affect Transfer Assistance: None risk of falls: Patient Identification Verified: Yes Signs or symptoms of abuse/neglect since last visito No Secondary Verification Process Completed: Yes Hospitalized since last visit: No Patient Requires Transmission-Based Precautions: No Implantable device outside of the clinic excluding No Patient Has Alerts: No cellular tissue based products placed in the center since last visit: Has Dressing in Place as Prescribed: Yes Pain Present Now: No Electronic Signature(s) Signed: 09/07/2019 5:09:44 PM By: Deon Pilling Entered By: Deon Pilling on 09/07/2019 15:29:48 -------------------------------------------------------------------------------- Encounter Discharge Information Details Patient Name: Date of Service: Nancy LLO [name] 09/07/2019 2:45 PM Medical Record Number: [id number] Patient Account Number: [id number] Date of Birth/Sex: Treating RN: Oct 23, 1970 (49 y.o. [name] Primary Care Phenix Grein: [name], [name] Other Clinician: Referring Sarah-Jane Nazario: Treating Kynadee Dam/Extender: [name], [name] Weeks in Treatment: 50 Encounter Discharge Information Items Post Procedure Vitals Discharge Condition:  Stable Temperature (F): 98.7 Ambulatory Status: Wheelchair Pulse (bpm): 82 Discharge Destination: Home Respiratory Rate (breaths/min): 18 Transportation: Private Auto Blood Pressure (mmHg): 128/78 Accompanied By: self Schedule Follow-up Appointment: Yes Clinical Summary of Care: Electronic Signature(s) Signed: 09/07/2019 5:09:44 PM By: Deon Pilling Entered By: Deon Pilling on 09/07/2019 17:09:29 -------------------------------------------------------------------------------- Lower Extremity Assessment Details Patient Name: Date of Service: Nancy [name] 09/07/2019 2:45 PM Medical Record Number: [id number] Patient Account Number: [id number] Date of Birth/Sex: Treating RN: [birth year] (49 y.o. [name] Primary Care Jamell Opfer: [name], [name] Other Clinician: Referring Azhane Eckart: Treating Nayquan Evinger/Extender: [name], [name] Weeks in Treatment: 52 Edema Assessment Assessed: [Left: No] [Right: Yes] Edema: [Left: Ye] [Right: s] Calf Left: Right: Point of Measurement: cm From Medial Instep cm 26 cm Ankle Left: Right: Point of Measurement: cm From Medial Instep cm 19 cm Electronic Signature(s) Signed: 09/07/2019 5:09:44 PM By: Deon Pilling Entered By: Deon Pilling on 09/07/2019 15:31:51 -------------------------------------------------------------------------------- Multi Wound Chart Details Patient Name: Date of Service: Nancy LLO [name] 09/07/2019 2:45 PM Medical Record Number: [id number] Patient Account Number: [id number] Date of Birth/Sex: Treating RN: [birth year] (49 y.o. Nancy Fetter Primary Care Spirit Wernli: [name], [name] Other Clinician: Referring Meiko Ives: Treating Arney Mayabb/Extender: [name], [name] Weeks in Treatment: 54 Vital Signs Height(in): 66 Capillary Blood Glucose(mg/dl): 130 Weight(lbs): 125 Pulse(bpm): 83 Body Mass Index(BMI): 20 Blood Pressure(mmHg): 128/78 Temperature(F):  98.7 Respiratory Rate(breaths/min): 18 Photos: [43:No Photos Right, Medial Foot] [N/A:N/A N/A] Wound Location: [43:Gradually Appeared] [N/A:N/A] Wounding Event: [43:Diabetic Wound/Ulcer of the Lower] [N/A:N/A] Primary Etiology: [43:Extremity Cataracts, Chronic sinus] [N/A:N/A] Comorbid History: [43:problems/congestion, Anemia, Sleep Apnea, Deep Vein Thrombosis, Hypertension, Peripheral Arterial Disease, Type I Diabetes, Osteoarthritis, Osteomyelitis, Neuropathy, Seizure Disorder 08/04/2018] [N/A:N/A] Date Acquired: [43:52] [N/A:N/A] Weeks of Treatment: [43:Open] [N/A:N/A] Wound Status: [43:Yes] [N/A:N/A] Clustered Wound: [43:5x3.6x0.1] [N/A:N/A] Measurements L x W x D (cm) [43:14.137] [N/A:N/A] A (  cm) : rea [43:1.414] [N/A:N/A] Volume (cm) : [43:80.50%] [N/A:N/A] % Reduction in A rea: [43:93.50%] [N/A:N/A] % Reduction in Volume: [43:Grade 4] [N/A:N/A] Classification: [43:Medium] [N/A:N/A] Exudate A mount: [43:Serosanguineous] [N/A:N/A] Exudate Type: [43:red, brown] [N/A:N/A] Exudate Color: [43:Flat and Intact] [N/A:N/A] Wound Margin: [43:Small (1-33%)] [N/A:N/A] Granulation A mount: [43:Pink, Pale] [N/A:N/A] Granulation Quality: [43:Large (67-100%)] [N/A:N/A] Necrotic A mount: [43:Eschar, Adherent Slough] [N/A:N/A] Necrotic Tissue: [43:Fat Layer (Subcutaneous Tissue)] [N/A:N/A] Exposed Structures: [43:Exposed: Yes Muscle: Yes Fascia: No Tendon: No Joint: No Bone: No None] [N/A:N/A] Epithelialization: [43:Debridement - Excisional] [N/A:N/A] Debridement: Pre-procedure Verification/Time Out 16:11 [N/A:N/A] Taken: [43:Necrotic/Eschar, Subcutaneous,] [N/A:N/A] Tissue Debrided: [43:Slough Skin/Subcutaneous Tissue] [N/A:N/A] Level: [43:18] [N/A:N/A] Debridement A (sq cm): [43:rea Curette] [N/A:N/A] Instrument: [43:Swab] [N/A:N/A] Specimen: [43:1] [N/A:N/A] Number of Specimens Taken: [43:Minimum] [N/A:N/A] Bleeding: [43:Pressure] [N/A:N/A] Hemostasis Achieved: [43:0]  [N/A:N/A] Procedural Pain: [43:0] [N/A:N/A] Post Procedural Pain: Debridement Treatment Response: Procedure was tolerated well [N/A:N/A] Post Debridement Measurements L x 5x3.6x0.1 [N/A:N/A] W x D (cm) [43:1.414] [N/A:N/A] Post Debridement Volume: (cm) [43:Debridement] [N/A:N/A] Treatment Notes Wound #43 (Right, Medial Foot) 1. Cleanse With Wound Cleanser 3. Primary Dressing Applied Calcium Alginate Other primary dressing (specifiy in notes) 4. Secondary Dressing Dry Gauze Notes primary dressing medihoney alginate. patient declined kerlix. ace wrap patient request. Electronic Signature(s) Signed: 09/07/2019 6:28:13 PM By: [name] RN, BSN Signed: 09/07/2019 8:16:21 PM By: Linton Ham MD Entered By: Linton Ham on 09/07/2019 18:17:16 -------------------------------------------------------------------------------- Multi-Disciplinary Care Plan Details Patient Name: Date of Service: Nancy LLO [address] [name] 09/07/2019 2:45 PM Medical Record Number: [id number] Patient Account Number: [id number] Date of Birth/Sex: Treating RN: 23-Oct-1970 (49 y.o. Nancy Fetter Primary Care Satrina Magallanes: [name], [name] Other Clinician: Referring Nicolas Sisler: Treating Keisha Amer/Extender: [name], [name] Weeks in Treatment: 57 Active Inactive Wound/Skin Impairment Nursing Diagnoses: Impaired tissue integrity Knowledge deficit related to ulceration/compromised skin integrity Goals: Patient/caregiver will verbalize understanding of skin care regimen Date Initiated: 09/03/2018 Target Resolution Date: 09/25/2019 Goal Status: Active Ulcer/skin breakdown will have a volume reduction of 30% by week 4 Date Initiated: 09/03/2018 Date Inactivated: 10/07/2018 Target Resolution Date: 10/01/2018 Goal Status: Unmet Unmet Reason: COMORBITIES Ulcer/skin breakdown will have a volume reduction of 50% by week 8 Date Initiated: 10/07/2018 Date Inactivated: 11/03/2018 Target Resolution Date:  10/31/2018 Goal Status: Unmet Unmet Reason: Osteomyelitis Interventions: Assess patient/caregiver ability to obtain necessary supplies Assess patient/caregiver ability to perform ulcer/skin care regimen upon admission and as needed Assess ulceration(s) every visit Provide education on ulcer and skin care Treatment Activities: Skin care regimen initiated : 09/03/2018 Topical wound management initiated : 09/03/2018 Notes: Electronic Signature(s) Signed: 09/07/2019 6:28:13 PM By: [name] RN, BSN Entered By: [name] on 09/07/2019 16:11:15 -------------------------------------------------------------------------------- Pain Assessment Details Patient Name: Date of Service: Nancy LLO [name] 09/07/2019 2:45 PM Medical Record Number: [id number] Patient Account Number: [id number] Date of Birth/Sex: Treating RN: 1970/07/24 (49 y.o. [name] Primary Care Cambria Osten: [name], [name] Other Clinician: Referring Lottie Sigman: Treating Robyne Matar/Extender: [name], [name] Weeks in Treatment: 57 Active Problems Location of Pain Severity and Description of Pain Patient Has Paino No Site Locations Rate the pain. Current Pain Level: 0 Pain Management and Medication Current Pain Management: Medication: No Cold Application: No Rest: No Massage: No Activity: No T.E.N.S.: No Heat Application: No Leg drop or elevation: No Is the Current Pain Management Adequate: Adequate How does your wound impact your activities of daily livingo Sleep: No Bathing: No Appetite: No Relationship With Others: No Bladder  Continence: No Emotions: No Bowel Continence: No Work: No Toileting: No Drive: No Dressing: No Hobbies: No Electronic Signature(s) Signed: 09/07/2019 5:09:44 PM By: Deon Pilling Entered By: Deon Pilling on 09/07/2019 15:31:39 -------------------------------------------------------------------------------- Patient/Caregiver Education Details Patient  Name: Date of Service: Nancy LLO [name] 6/7/2021andnbsp2:45 PM Medical Record Number: [id number] Patient Account Number: [id number] Date of Birth/Gender: Treating RN: [birth year] (49 y.o. Nancy Fetter Primary Care Physician: [name], [name] Other Clinician: Referring Physician: Treating Physician/Extender: [name], [name] Weeks in Treatment: 68 Education Assessment Education Provided To: Patient Education Topics Provided Wound/Skin Impairment: Methods: Explain/Verbal Responses: State content correctly Electronic Signature(s) Signed: 09/07/2019 6:28:13 PM By: [name] RN, BSN Entered By: [name] on 09/07/2019 16:11:32 -------------------------------------------------------------------------------- Wound Assessment Details Patient Name: Date of Service: Nancy LLO [name] 09/07/2019 2:45 PM Medical Record Number: [id number] Patient Account Number: [id number] Date of Birth/Sex: Treating RN: 1970/08/08 (49 y.o. [name], Meta.Reding Primary Care Avarey Yaeger: [name], [name] Other Clinician: Referring Makelle Marrone: Treating Darlene Bartelt/Extender: [name], [name] Weeks in Treatment: 52 Wound Status Wound Number: 43 Primary Diabetic Wound/Ulcer of the Lower Extremity Etiology: Wound Location: Right, Medial Foot Wound Open Wounding Event: Gradually Appeared Status: Date Acquired: 08/04/2018 Comorbid Cataracts, Chronic sinus problems/congestion, Anemia, Sleep Weeks Of Treatment: 52 History: Apnea, Deep Vein Thrombosis, Hypertension, Peripheral Arterial Clustered Wound: Yes Disease, Type I Diabetes, Osteoarthritis, Osteomyelitis, Neuropathy, Seizure Disorder Wound Measurements Length: (cm) 5 Width: (cm) 3.6 Depth: (cm) 0.1 Area: (cm) 14.137 Volume: (cm) 1.414 % Reduction in Area: 80.5% % Reduction in Volume: 93.5% Epithelialization: None Tunneling: No Undermining: No Wound Description Classification: Grade 4 Wound  Margin: Flat and Intact Exudate Amount: Medium Exudate Type: Serosanguineous Exudate Color: red, brown Foul Odor After Cleansing: No Slough/Fibrino Yes Wound Bed Granulation Amount: Small (1-33%) Exposed Structure Granulation Quality: Pink, Pale Fascia Exposed: No Necrotic Amount: Large (67-100%) Fat Layer (Subcutaneous Tissue) Exposed: Yes Necrotic Quality: Eschar, Adherent Slough Tendon Exposed: No Muscle Exposed: Yes Necrosis of Muscle: No Joint Exposed: No Bone Exposed: No Treatment Notes Wound #43 (Right, Medial Foot) 1. Cleanse With Wound Cleanser 3. Primary Dressing Applied Calcium Alginate Other primary dressing (specifiy in notes) 4. Secondary Dressing Dry Gauze Notes primary dressing medihoney alginate. patient declined kerlix. ace wrap patient request. Electronic Signature(s) Signed: 09/07/2019 5:09:44 PM By: Deon Pilling Entered By: Deon Pilling on 09/07/2019 15:33:28 -------------------------------------------------------------------------------- Vitals Details Patient Name: Date of Service: Nancy LLO [name] 09/07/2019 2:45 PM Medical Record Number: [id number] Patient Account Number: [id number] Date of Birth/Sex: Treating RN: 15-Jan-1971 (49 y.o. [name], Meta.Reding Primary Care Dazia Lippold: [name], [name] Other Clinician: Referring Jenna Ardoin: Treating Emerick Weatherly/Extender: [name], [name] Weeks in Treatment: 25 Vital Signs Time Taken: 15:29 Temperature (F): 98.7 Height (in): 67 Pulse (bpm): 82 Weight (lbs): 125 Respiratory Rate (breaths/min): 18 Body Mass Index (BMI): 19.6 Blood Pressure (mmHg): 128/78 Capillary Blood Glucose (mg/dl): 130 Reference Range: 80 - 120 mg / dl Electronic Signature(s) Signed: 09/07/2019 5:09:44 PM By: Deon Pilling Entered By: Deon Pilling on 09/07/2019 15:30:54

## 2019-09-07 NOTE — Progress Notes (Signed)
[name] ([id number]) Visit Report for 09/07/2019 Debridement Details Patient Name: Date of Service: [name] 09/07/2019 2:45 PM Medical Record Number: [id number] Patient Account Number: [id number] Date of Birth/Sex: Treating RN: 1970/11/22 (49 y.o. [name] Primary Care Provider: Sanjuana [name], [name] Other Clinician: Referring Provider: Treating Provider/Extender: [name], [name] Weeks in Treatment: 52 Debridement Performed for Assessment: Wound #43 Right,Medial Foot Performed By: Physician [name]., MD Debridement Type: Debridement Severity of Tissue Pre Debridement: Fat layer exposed Level of Consciousness (Pre-procedure): Awake and Alert Pre-procedure Verification/Time Out Yes - 16:11 Taken: Start Time: 16:11 T Area Debrided (L x W): otal 5 (cm) x 3.6 (cm) = 18 (cm) Tissue and other material debrided: Viable, Non-Viable, Eschar, Slough, Subcutaneous, Slough Level: Skin/Subcutaneous Tissue Debridement Description: Excisional Instrument: Curette Specimen: Swab, Number of Specimens T aken: 1 Bleeding: Minimum Hemostasis Achieved: Pressure End Time: 16:13 Procedural Pain: 0 Post Procedural Pain: 0 Response to Treatment: Procedure was tolerated well Level of Consciousness (Post- Awake and Alert procedure): Post Debridement Measurements of Total Wound Length: (cm) 5 Width: (cm) 3.6 Depth: (cm) 0.1 Volume: (cm) 1.414 Character of Wound/Ulcer Post Debridement: Improved Severity of Tissue Post Debridement: Fat layer exposed Post Procedure Diagnosis Same as Pre-procedure Electronic Signature(s) Signed: 09/07/2019 6:28:13 PM By: Nancy Hurst RN, BSN Signed: 09/07/2019 8:16:21 PM By: Nancy Ham MD Entered By: [name] on 09/07/2019 18:17:32 -------------------------------------------------------------------------------- HPI Details Patient Name: Date of Service: [name] 09/07/2019 2:45  PM Medical Record Number: [id number] Patient Account Number: [id number] Date of Birth/Sex: Treating RN: Apr 26, 1970 (49 y.o. [name] Primary Care Provider: Sanjuana [name], [name] Other Clinician: Referring Provider: Treating Provider/Extender: [name], [name] Weeks in Treatment: 75 History of Present Illness HPI Description: 49 year old diabetic who is known to have type 1 diabetes which is poorly controlled last hemoglobin A1c was 11%. She comes in with a ulcerated area on the left lateral foot which has been there for over 6 months. Was recently she has been treated by Dr. Amalia [name] of podiatry who saw her last on 05/28/2016. Review of his notes revealed that the patient had incision and drainage with placement of antibiotic beads to the left foot on 04/11/2016 for possible osteomyelitis of the cuboid bone. Over the last year she's had a history of amputation of the left fifth toe and a femoropopliteal popliteal bypass graft somewhere in April 2017. 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 His note Dr. Amalia [name] mentions that the patient has been referred to me for further wound care and possibly great candidate for hyperbaric oxygen therapy due to recurrent osteomyelitis. However we do not have any x-rays of biopsy reports confirming this. He has been on several antibiotics including Bactrim and most recently is on doxycycline for an MRSA. I understand, the patient was not a candidate for IV antibiotics as she has had previous PICC lines which resulted in blood clots in both arms. There was a x-ray report dated 04/04/2016 on Dr. Amalia [name] notes which showed evidence of fifth ray resection left foot with osteolytic changes noted to the fourth metatarsal and cuboid bone on the left. 06/13/2016 -- had a left foot x-ray which showed no acute fracture or dislocation and no definite radiographic evidence of osteomyelitis. Advanced osteopenia was seen. 06/20/2016 -- she has  noticed a new wound on the right plantar foot in the region where she had a callus before. 06/27/16-  the patient did have her x-ray of the right foot which showed no findings to suggest osteomyelitis. She saw her endocrinologist, [name], yesterday. Her A1c in January was 11. He also indicates mismanagement and noncompliance regarding her diabetes. She is currently on Bactrim for a lip infection. She is complaining of nausea, vomiting and diarrhea. She is unable to articulate the exact orders or dosing of the Bactrim; it is unclear when she will complete this. 07/04/2016 -- results from [facility] of ABIs with ankle waveforms were noted from 02/14/2016. The examination done on 06/27/2015 showed noncompressible ABIs with the right being 1.45 and the left being 1.33. The present examination showed a right ABI of 1.19 on the left of 1.33. The conclusion was that right normal ABI in the lower extremity at rest however compared to previous study which was noncompressible ABI may be falsely elevated side suggesting medial calcification. The left ABI suggested medial calcification. 08/01/2016 -- the patient had more redness and pain on her right foot and did not get to come to see as noted she see her PCP or go to the ER and decided to take some leftover metronidazole which she had at home. As usual, the patient does report she feels and is rather noncompliant. 08/08/2016 -- -- x-ray of the right foot -- FINDINGS:Transmetatarsal amputation is noted. No bony destruction is noted to suggest osteomyelitis. IMPRESSION: No evidence of osteomyelitis. Postsurgical changes are seen. MRI would be more sensitive for possible bony changes. Culture has grown Serratia Marcescens -- sensitive to Bactrim, ciprofloxacin, ceftazidime she was seen by Dr. Daylene [name] on 08/06/2016. He did not find any exposed bone, muscle, tendon, ligament or joint. There was no malodor and he did a excisional debridement in the  office. ============ Old notes: 49 year old patient who is known to the wound clinic for a while had been away from the wound clinic since 09/01/2014. Over the last several months she has been admitted to various hospitals including [facility]. She was treated for a right metatarsal osteomyelitis with a transmetatarsal amputation and this was done about 2 months ago. He has a small ulcerated area on the right heel and she continues to have an ulcerated area on the left plantar aspect of the foot. The patient was recently admitted to the [facility] hospital group between 7/12 and 10/18/2014. she was given 3 weeks of IV vancomycin and was to follow-up with her surgeons at [facility] and also took oral vancomycin for C. difficile colitis. 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 MRI of the left foot done on 10/14/2014 showed no abscess or osteomyelitis. 04/27/15; this is a patient we know from previous stays in the wound care center. She is a type I diabetic I am not sure of her control currently. Since the last time I saw her she is had a right transmetatarsal amputation and has no wounds on her right foot and has no open wounds. She is been followed at the wound care center at [facility] in [place]. She comes today with the desire to undergo hyperbaric treatment locally. Apparently one of her wound care providers in [place] has suggested hyperbarics. This is in response to an MRI from 04/18/15 that showed increased marrow signal and loss of the proximal fifth metatarsal cortex evidence  of osteomyelitis with likely early osteomyelitis in the cuboid bone as well. She has a large wound over the base of the  fifth metatarsal. She also has a eschar over her the tips of her toes on 1,3 and 5. She does not have peripheral pulses and apparently is going for an angiogram tomorrow which seems reasonable. After this she is going to infectious disease at [facility] They have been using Medihoney to the large wound on the lateral aspect of the left foot to. The patient has known Charcot deformity from diabetic neuropathy. She also has known diabetic PAD. Surprisingly I can't see that she has had any recent antibiotics, the patient states the last antibiotic she had was at the end of November for 10 days. I think this was in response to culture that showed group G strep although I'm not exactly sure where the culture was from. She is also had arterial studies on 03/29/15. This showed a right ABI of 1.4 that was noncompressible. Her left ABI was 0.73. 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 It was not felt that arterial inflow was adequate for healing of a foot ulcer. Her Doppler waveforms looked monophasic ===== READMISSION 02/28/17; this is in an now 49 year old woman we've had at several different occasions in this clinic. 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 She has a remote ex-smoker. She was last seen in this clinic by Dr. Con Memos I think in May. More recently she is been followed by her podiatrist Dr. Amalia [name] an infectious disease Dr. Megan Salon. She has 2 open wounds the major one is over the right first metatarsal head she also has a wound on the left plantar foot. an MRI of the right foot on 01/01/17 showed a soft tissue ulcer along the plantar aspect of the first metatarsal base consistent with osteomyelitis of the first metatarsal stump. Dr. 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 According to the patient this is been open for slightly over a month. She has  been on a combination of Cipro 500 twice a day, Zyvox 600 twice a day and Flagyl 500 3 times a day for over a month now as directed by Dr. Megan Salon. cultures of the right foot earlier this year showed MRSA in January and Serratia in May. January also had a few viridans strep. Recent x-rays of both feet were done and Dr. Amalia [name] office and I don't have these reports. The patient has known PAD and has a history of aleft femoropopliteal bypass in April 2017. 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 Dwyane Dee. In spite of this the last hemoglobin A1c I can see is 10.1 on 01/01/2017. She is being referred by Dr. 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 She is been using Medihoney to this area and also an area on the left midfoot. She is using healing sandals bilaterally. ABIs in this clinic at the left posterior tibial was 1.1 noncompressible on the right READMISSION Non invasive vascular NOVANT 5/18 Aftercare following surgery of the circulatory system Procedure Note - Interface, External Ris In - 08/13/2016 11:05 AM EDT Procedure: Examination consists of physiologic resting arterial pressures of the brachial and  ankle arteries bilaterally with continuous wave Doppler waveform analysis. Previous: Previous exam performed on 02/14/16 demonstrated ABIs of Rt = 1.19 and Lt = 1.33. Right: ABI = non-compressible PT 1.47 DP. S/P transmet amputation. , Left: ABI = 1.52, 2nd digit pressure = 87 mmHg Conclusions: Right: ABI (>1.3) may be falsely elevated, suggesting medial calcification. Left: ABI (>1.3) may be falsely elevated, suggesting medial calcification The patient is a now 49 year old type I diabetic is had multiple issues her graded to chronic diabetic foot  ulcers. 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 We had her in the clinic lastin November. At that point she had wounds on her bilateral feet.she had wanted to try hyperbarics however the healogics review process denied her because she hadn't followed up with her vascular surgeon for her left femoropopliteal bypass. The bypass was done by Dr. Raul Del at [facility]. We made her a follow-up with Dr. Raul Del however she did not keep the appointment and therefore she was not approved The patient shows me a small wound on her left fourth metatarsal head on her phone. She developed rapid discoloration in the plantar aspect of the left foot and she was admitted to hospital from 2/2 through 05/10/17 with wet gangrene of the left foot osteomyelitis of the fourth metatarsal heads. She was admitted acutely ill with a temperature of 103. She was started on broad-spectrum vancomycin and cefepime. On 05/06/17 she was taken to the OR by Dr. Amalia [name] her podiatric surgeon for an incision and drainage irrigation of the left foot wound. Cultures from this surgery revealed group be strep and anaerobes. she was seen by Dr.Xu of orthopedic surgery and scheduled for a below-knee amputation which she u refused. Ultimately she was discharged on Levaquin and Flagyl for one month. MRI 05/05/17 done while she was in the hospital showed abscess adjacent to the fourth metatarsal head and neck small abscess around the fourth flexor tendon. Inflammatory phlegmon and gas in the soft tissues along the lateral aspect of the fourth phalanx. Findings worrisome for osteomyelitis involving the fourth proximal and middle phalanx and also the third and fourth metatarsals. Finally the patient had actually shortly before this followed up with Dr. Raul Del at no time on 04/29/17. He felt that her left femoropopliteal bypass was patent he felt that her left-sided toe  pressures more than adequate for healing a wound on the left foot. This was before her acute presentation. 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 05/28/17; she is started hyperbarics. The patient tells me that for some reason she was not actually on Levaquin but I think on ciprofloxacin. She was on Flagyl. She only started her Levaquin yesterday due to some difficulty with the pharmacy and perhaps her sister picking it up. She has an appointment with Dr. Amalia [name] tomorrow and with infectious disease early next week. She has no new complaints 06/06/17; the patient continues in hyperbarics. She saw Dr. Amalia [name] on 05/29/17 who is her podiatric surgeon. He is elected for a transmetatarsal amputation on 06/27/17. I'm not sure at what level he plans to do this amputation. The patient is unaware She also saw Dr. Megan Salon of infectious disease who elected to continue her on current antibiotics I think this is ciprofloxacin and Flagyl. I'll need to clarify with her tomorrow if she actually has this. We're using silver alginate to the actual wound. Necrotic surface today with material under the flap of her foot. Original MRI showed abscesses as well  as osteomyelitis of the proximal and middle fourth phalanx and the third and fourth metatarsal heads 06/11/17; patient continues in hyperbarics and continues on oral antibiotics. She is doing well. The wound looks better. The necrotic part of this under the flap in her superior foot also looks better. she is been to see Dr. Amalia [name]. I haven't had a chance to look at his note. Apparently he has put the transmetatarsal amputation on hold her request it is still planning to take her to the OR for debridement and product application ACEL. I'll see if I can find his note. I'll therefore leave product ordering/requests to Dr. Amalia [name] for now. I was going to look at Dermagraft 06/18/17-she is here in follow-up evaluation for bilateral foot wounds. She continues with hyperbaric  therapy. She states she has been applying manuka honey to the right plantar foot and alternate manuka honey and silver alginate to the left foot, despite our orders. We will continue with same treatment plan and she will follo up next week. 06/25/17; I have reviewed Dr. Amalia [name] last note from 3/11. She has operative debridement in 2 days' time. By review his note apparently they're going to place there is skin over the majority of this wound which is a good choice. She has a small satellite area at the most proximal part of this wound on the left plantar foot. The area on the right plantar foot we've been using silver alginate and it is close to healing. 07/02/17; unfortunately the patient was not easily approved for Dr. Amalia [name] proposed surgery. I'm not completely certain what the issue is. She has been using silver alginate to the wound she has completed a first course of hyperbarics. She is still on Levaquin and Flagyl. I have really lost track of the time course here.I suspect she should have another week to 2 of antibiotics. I'll need to see if she is followed up with infectious disease Dr. Megan Salon 07/09/17; the patient is followed up with Dr. Megan Salon. 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 She continues on Levaquin and metronidazole continuing both of these for now I think she is been on fr about 6 weeks. She still has some drainage but no pain. No fever. Her had been plans for her to go to the OR for operative debridement with her podiatrist Dr. Amalia [name], I am not exactly sure where that is. I'll probably slip a note to Dr. Amalia [name] today. I note that she follows with Dr. Dwyane Dee of endocrinology. We have her recertified for hyperbaric oxygen. I have not heard about Dermagraft however I'll see if Dr. Amalia [name] is planning a skin substitute as well 07/16/17; the patient tells me she is just about out of [place]. I'll need to check Dr. Hale Bogus last notes on this. She  states she has plenty of Flagyl however. She comes in today complaining of pain in the right lateral foot which she said lasted for about a day. The wound on the right foot is actually much more medially. She also tells me that the [facility] cost a lot of pain in the left foot wound and she turned back to silver alginate. Finally Dermagraft has a $099 per application co-pay. She cannot afford this 07/23/17; patient arrives today with the wound not much smaller. There is not much new to add. She has not heard from Dr. Amalia [name] all try to put in a call to them today. She was asking about Dermagraft again  and she has an over $811 per application co-pay she states that she would be willing to try to do a payment plan. I been tried to avoid this. We've been using silver alginate, I'll change to [facility] 07/30/17-She is here in follow-up evaluation for left foot ulcer. She continues hyperbaric medicine. The left foot ulcer is stable we will continue with same treatment plan 08/06/17; she is here for evaluation of her left foot ulcer. Currently being treated for hyperbarics or underlying osteomyelitis. She is completed antibiotics. The left foot ulcer is better smaller with healthier looking granulation. For various reasons I am not really clear on we never got her back to the OR with Dr. Amalia [name]. He did not respond to my secure text message. Nevertheless I think that surgery on this point is not necessary nor am I completely clear that a skin substitute is necessary The patient is complaining about pain on the outside of her right foot. 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 There is no erythema. She also states the foot is warm versus her other part of her upper leg and this is largely true. It is not totally clear to me what's causing this. She thinks it's different from her usual neuropathy pain 08/13/17; she arrives in clinic today with a small wound which is superficial on her right  first metatarsal head. 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 She tells [place] she was up on her feet over the Mother's Day celebration. The large wound is on the left foot. Continues with hyperbarics for underlying osteomyelitis. We're using Hydrofera Blue. She asked me today about where we were with Dermagraft. I had actually excluded this because of the co-pay however she wants to assume this therefore I'll recheck the co-pay an order for next week. 08/20/17; the patient agreed to accept the co-pay of the first Apligraf which we applied today. She is disappointed she is finishing hyperbarics will run this through the insurance on the extent of the foot infection and the extent of the wound that she had however she is already had 60 dive's. Dermagraft No. 1 08/27/17; Dermagraft No. 2. She is not eligible for any more hyperbaric treatments this month. She reports a fair amount of drainage and she actually changed to the external dressings without disturbing the direct contact layer 09/03/17; the patient arrived in clinic today with the wound superficially looking quite healthy. Nice vibrant red tissue with some advancing epithelialization although not as much adherence of the flap as I might like. However she noted on her own fourth toe some bogginess and she brought that to our attention. Indeed this was boggy feeling like a possibility of subcutaneous fluid. 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 She is not been unwell. We've been using Dermagraft 09/10/17; the culture that I did not last week was MRSA. She saw Dr. Megan Salon this morning who is going to start her on vancomycin. I had sent him a secure a text message yesterday. I also spoke with her podiatric surgeon Dr. Amalia [name] about surgery on this foot the options for conserving a functional foot etc. Promised me he would see her and will make back  consultation today. Paradoxically her actual wound on the plantar aspect of her left foot looks really quite good. I had given her 5 days worth of Baxdella to cover her for MRSA. Her MRI came back showing  osteomyelitis within the third metatarsal shaft and head and base of the third and fourth proximal phalanx. She had extensive inflammatory changes throughout the soft tissue of the lateral forefoot. With an ill-defined fluid around the fourth metatarsal extending into the plantar and dorsal soft tissues 09/19/17; the patient is actually on oral Septra and Flagyl. She apparently refused IV vancomycin. She also saw Dr. Amalia [name] at my request who is planning her for a left BKA sometime in mid July. MRI showed osteomyelitis within the third metatarsal shaft and head and the basis of the third and fourth proximal phalanx. I believe there was felt to be possible septic arthritis involving the third MTP. 09/26/17; the patient went back to Dr. Megan Salon at my suggestion and is now receiving IV daptomycin. Her wound continues to look quite good making the decision to proceed with a transmetatarsal amputation although more difficult for the patient. I believe in my extensive discussions with her she has a good sense of the pros and cons of this. I don't NV the tuft decision she has to make. She has an appointment with Dr. Amalia [name] I believe in mid July and I previously spoken to him about this issue Has we had used 3 previous Dermagraft. Given the condition of the wound surface I went ahead and added the fourth one today area and I did this not fully realizing that she'll be traveling to [place] next week. I'm hopeful she can come back in 2 weeks 10/21/17; Her same Dermagraft on for about 3-1/2 weeks. In spite of this the wound arrives looking quite healthy. There is been a lot of healing dimensions are smaller. Looking at the square shaped wound she has now there is some undermining and some depth medially under the  undermining although I cannot palpate any bone. No surrounding infection is obvious. She has difficult questions about how to look at this going forward vis--vis amputations versus continued medical therapy. T be truthful the wound is looks o so healthy and it is continued to contract. Hard to justify foot surgery at this point although I still told her that I think it might come to that if we are not able to eradicate the underlying MRSA. She is still highly at risk and she understands this 11/06/17 on evaluation today patient appears to be doing better in regard to her foot ulcer. She's been tolerating the dressing changes without complication. Currently she is here for her Dermagraft #6. Her wound continues to make excellent progress at this point. She does not appear to have any evidence of infection which is good news. 11/13/17 on evaluation today patient appears to be doing excellent at this time. She is here for repeat Dermagraft application. This is #7. Overall her wound seems to be making great progress. 12/05/17; the patient arrives with the wound in much better condition than when I last saw this almost 6 weeks ago. She still has a small probing area in the left metatarsal head region on the lateral aspect of her foot. We applied her last Dermagraft today. Since the last time she is here she has what appears to a been a blood blister on the plantar aspect of left foot although I don't see this is threatening. There is also a thick raised tissue on the right mid metatarsal head region. This was not there I don't think the last time she was here 3 weeks ago. 12/12/17; the patient continues to have a small programming area in the left metatarsal head  region on the lateral aspect of her foot which was the initial large surgical wound. I applied her last Apligraf last week. I'm going to use Endoform starting today Unfortunately she has an excoriated area in the left mid foot and the right mid foot.  The left midfoot looks like a blistered area this was not opened last week it certainly is open today. Using silver alginate on these areas. She promises me she is offloading this. 12/19/17; the small probing area in the left metatarsal head eyes think is shallower. In general her original wound looks better. We've been using Endoform. The area inferiorly that I think was trauma last week still requires debridement a lot of nonviable surface which I removed. She still has an open open area distally in her foot Similarly on the right foot there is tightly adherent surface debris which I removed. Still areas that don't look completely epithelialized. This is a small open area. We used silver alginate on these areas 12/26/2017; the patient did not have the supplies we ordered from last week including the Endoform. The original large wound on the left lateral foot looks healthy. She still has the undermining area that is largely unchanged from last week. She has the same heavily callused raised edged wounds on the right mid and left midfoot. Both of these requiring debridement. We have been using silver alginate on these areas 01/02/2018; there is still supply issues. We are going to try to use Prisma but I am not sure she actually got it from what she is saying. She has a new open area on the lateral aspect of the left fourth toe [previous fifth ray amputation]. Still the one tunneling area over the fourth metatarsal head. The area is in the midfoot bilaterally still have thick callus around them. She is concerned about a raised swelling on the lateral aspect of the foot. However she is completely insensate 01/10/2018; we are using Prisma to the wounds on her bilateral feet. Surprisingly the tunneling area over the left fourth metatarsal head that was part of her original surgery has closed down. She has a small open area remaining on the incision line. 2 open areas in the midfoot. 02/10/2018; the patient  arrives back in clinic after a month hiatus. She was traveling to visit family in [place]. Is fairly clear she was not offloading the areas on her feet. The original wound over the left lateral foot at the level of metatarsal heads is reopened and probes medially by about a centimeter or 2. She notes that a week ago she had purulent drainage come out of an area on the left midfoot. Paradoxically the worst area is actually on the right foot is extensive with purulent drainage. We will use silver alginate today 02/17/2018; the patient has 3 wounds one over the left lateral foot. She still has a small area over the metatarsal heads which is the remnant of her original surgical wound. This has medial probing depth of roughly 1.4 cm somewhat better than last week. The area on the right foot is larger. We have been using silver alginate to all areas. The area on the right foot and left foot that we cultured last week showed both Klebsiella and Proteus. Both of these are quinolone sensitive. The patient put her's self on Bactrim and Flagyl that she had left hanging around from prior antibiotic usages. She was apparently on this last week when she arrived. I did not realize this. Unfortunately the Bactrim will not  cover either 1 of these organisms. We will send in Cipro 500 twice daily for a week 03/04/2018; the patient has 2 wounds on the left foot one is the original wound which was a surgical wound for a deep DFU. At one point this had exposed bone. She still has an area over the fourth metatarsal head that probes about 1.4 cm although I think this is better than last week. I been using silver nitrate to try and promote tissue adherence and been using silver alginate here. She also has an area in the left midfoot. This has some depth but a small linear wound. Still requiring debridement. On the right midfoot is a circular wound. A lot of thick callus around this area. We have been using silver alginate to  all wound areas She is completed the ciprofloxacin I gave her 2 weeks ago. 03/11/2018; the patient continues to have 2 open areas on the left foot 1 of which was the original surgical wound for a deep DFU. Only a small probing area remains although this is not much different from last week we have been using silver alginate. The other area is on the midfoot this is smaller linear but still with some depth. We have been using silver alginate here as well On the right foot she has a small circular wound in the mid aspect. This is not much smaller than last time. We have been using silver alginate here as well 03/18/2018; she has 3 wounds on the left foot the original surgical wound, a very superficial wound in the mid aspect and then finally the area in the mid plantar foot. She arrives in today with a very concerning area in the wound in the mid plantar foot which is her most proximal wound. There is undermining here of roughly 1-1/2 cm superiorly. Serosanguineous drainage. She tells me she had some pain on for over the weekend that shot up her foot into her thigh and she tells me that she had a nodule in the groin area. She has the single wound in the right foot. We are using endoform to both wound areas 03/24/2018; the patient arrives with the original surgical wound in the area on the left midfoot about the same as last week. There is a collection of fluid under the surface of the skin extending from the surgical wound towards the midfoot although it does not reach the midfoot wound. The area on the right foot is about the same. Cultures from last week of the left midfoot wound showed abundant Klebsiella abundant Enterococcus faecalis and moderate methicillin resistant staph I gave her Levaquin but this would have only covered the Klebsiella. She will need linezolid 04/01/2018; she is taking linezolid but for the first few days only took 1 a day. I have advised her to finish this at twice daily  dosing. In any case all of her wounds are a lot better especially on the left foot. The original surgical wound is closed. The area on the left midfoot considerably smaller. The area on the right foot also smaller. 04/08/2018; her original surgical wound/osteomyelitis on the left foot remains closed. She has area on the left foot that is in the midfoot area but she had some streaking towards this. This is not connected with her original wound at least not visually. Small wound on the right midfoot appears somewhat smaller. 04/15/18; both wounds looks better. Original wound is better left midfoot. Using silver alginate 1/21; patient states she uses saltwater soak  in, stones or remove callus from around her wounds. She is also concerned about a blood blister she had on the left foot but it simply resolved on its own. We've been using silver alginate 1/28; the patient arrives today with the same streaking area from her metatarsals laterally [the site of her original surgical wound] down to the middle of her foot. There is some drainage in the subcutaneous area here. This concerns me that there is actually continued ongoing infection in the metatarsals probably the fourth and third. This fixates an MRI of the foot without contrast [chronic renal failure] The wound in the mid part of the foot is small but I wonder whether this area actually connects with the more distal foot. The area on the right midfoot is probably about the same. Callus thick skin around the small wound which I removed with a curette we have been using silver alginate on both wound areas 2/4; culture I did of the draining site on the left foot last time grew methicillin sensitive staph aureus. MRI of the left foot showed interval resolution of the findings surrounding the third metatarsal joint on the prior study consistent with treated osteomyelitis. Chronic soft tissue ulceration in the plantar and lateral aspect of the forefoot  without residual focal fluid collection. No evidence of recurrent osteomyelitis. 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 She tells me she had nausea and vomiting over the weekend and missed 2 days. In general her foot looks much the same. She has a small open area just below the left fourth metatarsal head. A linear area in the left midfoot. Some discoloration extending from the inferior part of this into the left lateral foot although this appears to be superficial. She has a small area on the right midfoot which generally looks smaller after debridement 2/18; the patient is completing his cephalexin and has another 2 days. She continues to have open areas on the left and right foot. 2/25; she is now off antibiotics. The area on the left foot at the site of her original surgical wound has closed yet again. She still has open areas in the mid part of her foot however these appear smaller. The area on the right mid foot looks about the same. We have been using silver alginate She tells me she had a serious hypoglycemic spell at home. She had to have EMS called and get IV dextrose 3/3; disappointing on the left lateral foot large area of necrotic tissue surrounding the linear area. This appears to track up towards the same original surgical wound. Required extensive debridement. The area on the right plantar foot is not a lot better also using silver 3/12; the culture I did last time showed abundant enterococcus. I have prescribed Augmentin, should cover any unrecognized anaerobes as well. In addition there were a few MRSA and Serratia that would not be well covered although I did not want to give her multiple antibiotics. She comes in today with a new wound  in the right midfoot this is not connected with the original wound over her MTP a lot of thick callus tissue around both wounds but once again she said she is not walking on these areas 3/17-Patient comes in for follow-up on the bilateral  plantar wounds, the right midfoot and the left plantar wound. Both these are heavily callused surrounding the wounds. We are continuing to use silver alginate, she is compliant with offloading and states she uses a wheelchair fairly often at home 3/24; both wound areas have thick callus. However things actually look quite a bit better here for the majority of her left foot and the right foot. 3/31; patient continues to have thick callused somewhat irritated looking tissue around the wounds which individually are fairly superficial. There is no evidence of surrounding infection. We have been using silver alginate however I change that to [facility] today 4/17; patient returns to clinic after having a scare with Covid she tested negative in her primary doctor's office. She has been using Hydrofera Blue. She does not have an open area on the right foot. On the left foot she has a small open area with the mid area not completely viable. She showed me pictures of what looks like a hemorrhagic blister from several days ago but that seems to have healed over this was on the lateral left foot 4/21; patient comes in to clinic with both her wounds on her feet closed. However over the weekend she started having pain in her right foot and leg up into the thigh. She felt as though she was running a low-grade fever but did not take her temperature. She took a doxycycline that she had leftover and yesterday a single Septra and metronidazole. She thinks things feel somewhat better. 4/28; duplex ultrasound I ordered last week was negative for DVT or superficial thrombophlebitis. She is completed the doxycycline I gave her. States she is still having a lot of pain in the right calf  and right ankle which is no better than last week. She cannot sleep. She also states she has a temperature of up to 101, coughing and complaining of visual loss in her bilateral eyes. Apparently she was tested for Covid 2 weeks ago at [facility] and that was negative. Readmission: 09/03/18 patient presents back for reevaluation after having been evaluated at the end of April regarding erythema and swelling of her right lower extremity. Subsequently she ended up going to the hospital on 07/29/18 and was admitted not to be discharged until 08/08/18. Unfortunately it was noted during the time that she was in the hospital that she did have methicillin-resistant Staphylococcus aureus as the infection noted at the site. It was also determined that she did have osteomyelitis which appears to be fairly significant. She was treated with vancomycin and in fact is still on IV vancomycin at dialysis currently. This is actually slated to continue until 09/12/18 at least which will be the completion of the six weeks of therapy. Nonetheless based on what I'm seeing at this point I'm not sure she will be anywhere near ready to discontinue antibiotics at that time. Since she was released from the hospital she was seen by Dr. Amalia [name] who is her podiatrist on 08/27/18. His note specifically states that he is recommended that the patient needs of one knee amputation on the right as she has a life- threatening situation that can lead quickly to sepsis. The patient advised she would like to try to save her leg to which Dr. Amalia [name] apparently told her that this was against all medical advice. She also want to discontinue the Wound VAC which had been initiated due to the fact that she wasn't pleased with how the wound was looking and subsequently she wanted to pursue applying  Medihoney at that time. He stated that he did not believe that the right lower extremity was salvageable and that the patient understood but would still like to attempt  hyperbaric option therapy if it could be of any benefit. She was therefore referred back to [place] for further evaluation. He plans to see her back next week. Upon inspection today patient has a significant amount purulent drainage noted from the wound at this point. The bone in the distal portion of her foot also appears to be extremely necrotic and spongy. When I push down on the bone it bubbles and seeps purulent drainage from deeper in the end of the foot. I do not think that this is likely going to heal very well at all and less aggressive surgical debridement were undertaken more than what I believe we can likely do here in our office. 09/12/2018; I have not seen this patient since the most recent hospitalization although she was in our clinic last week. I have reviewed some of her records from a complex hospitalization. 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 There is situation was complicated by MRSA bacteremia and acute on chronic renal failure now on dialysis. She is receiving vancomycin at dialysis. We started her on Dakin's wet-to-dry last week she is changing this daily. There is still purulent drainage coming out of her foot. Although she is apparently "agreeable" to a below-knee amputation which is been suggested by multiple clinicians she wants this to be done in [place]. She apparently has a telehealth visit with that provider sometime in late [place] 6/24. I have told her I think this is probably too long. Nevertheless I could not convince her to allow a local doctor to perform BKA. 09/19/2018; the patient has a large necrotic area on the right anterior foot. She has had previous transmetatarsal amputations. Culture I did last week showed MRSA nothing else she is on vancomycin at dialysis. She has continued leaking purulent drainage out of the distal part of the large circular wound on the right anterior foot. She apparently went to see  Dr. 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 Somehow that translated into her being referred to plastic surgery for debridement of the area. I gather she basically refused amputation although I do not have a copy of Dr. Berenice Primas notes. The patient really wants to have a trial of hyperbaric oxygen. I agreed with initial assessment in this clinic that this was probably too far along to benefit however if she is going to have plastic surgery I think she would benefit from ancillary hyperbaric oxygen. The issue here is that the patient has benefited as maximally as any patient I have ever seen from hyperbaric oxygen therapy. Most recently she had exposed bone on the lateral part of her left foot after a surgical procedure and that actually has closed. She has eschared areas in both heels but no open area. She is remained systemically well. I am not optimistic that anything can be done about this but the patient is very clear that she wants an attempt. The attempt would include a wound VAC further debridements and hyperbaric oxygen along with IV antibiotics. 6/26; I put her in for a trial of hyperbaric oxygen only because of the dramatic response she has had with wounds on her left midfoot earlier this year which was a surgical wound that went straight to her bone over the metatarsal heads and also remotely the  left third toe. We will see if we can get this through our review process and insurance. She arrives in clinic with again purulent material pouring out of necrotic bone on the top of the foot distally. There is also some concerning erythema on the front of the leg that we marked. It is bit difficult to tell how tender this is because of neuropathy. I note from infectious disease that she had her vancomycin extended. 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 She had the wound VAC on for part of the week. The rest of the time she is putting  various things on this including Medihoney, "ionized water" silver sorb gel etc. 7/7; follow-up along with HBO. She is still on vancomycin at dialysis. She has a large open area on the dorsal right foot and a small dark eschar area on her heel. There is a lot less erythema in the area and a lot less tenderness. From an infection point of view I think this is better. She still has a lot of necrosis in the remaining right forefoot [previous TMA] we are still using the wound VAC in this area 7/16; follow-up along with HBO. I put her on linezolid after she finished her vancomycin. We started this last Friday I gave her 2 weeks worth. I had the expectation that she would be operatively debrided by Dr. Marla Roe but that still has not happened yet. Patient phoned the office this week. She arrives for review today after HBO. The distal part of this wound is completely necrotic. Nonviable pieces of tendon bone was still purulent drainage. Also concerning that she has black eschar over the heel that is expanding. I think this may be indicative of infection in this area as well. She has less erythema and warmth in the ankle and calf but still an abnormal exam 7/21 follow-up along with HBO. I will renew her linezolid after checking a CBC with differential monitoring her blood counts especially her platelets. She was supposed to have surgery yesterday but if I am reading things correctly this was canceled after her blood sugar was found to be over 500. I thought Dr. Marla Roe who called me said that they were sending her to the ER but the patient states that was not the case. 7/28. Follow-up along with HBO. She is on linezolid I still do not have any lab work from dialysis even though I called last week. The patient is concerned about an area on her left lateral foot about the level of the base of her fifth metatarsal. I did not really see anything that ominous here however this patient is in [place] ability to  point out problems that she is sensing and she has been accurate in the past Finally she received a call from Dr. Marla Roe who is referring her to another orthopedic surgeon stating that she is too booked up to take her to the operating room now. Was still using a wound VAC on the foot 8/3 -Follow-up after HBO, she is got another week of linezolid, she is to call ID for an appointment, x-rays of both feet were reviewed, the left foot x-ray with third MTP joint osteo- Right foot x-ray widespread osteo-in the right midfoot Right ankle x-ray does not show any active evidence of infection 8/11-Patient is seen after HBO, the wounds on the right foot appear to be about the same, the heel wound had some necrotic base over tendon that was debrided with a curette  8/21; patient is seen after HBO. The patient's wound on her dorsal foot actually looks reasonably good and there is substantial amount of epithelialization however the open area distally still has a lot of necrotic debris partially bone. I cannot really get a good sense of just how deep this probes under the foot. She has been pressuring me this week to order medical maggots through a company in [place] for her. The problem I have is there is not a defined wound area here. On the positive side there is no purulence. She has been to see infectious disease she is still on Septra DS although I have not had a chance to review their notes 8/28; patient is seen in conjunction with HBO. The wounds on her foot continued to improve including the right dorsal foot substantially the, the distal part of this wound and the area on the right heel. We have been using a wound VAC over this chronically. She is still on trimethoprim as directed by infectious disease 9/4; patient is seen in conjunction with HBO. Right dorsal foot wound substantially anteriorly is better however she continues to have a deep wound in the distal part of this that is not responding.  We have been using silver collagen under border foam Area on the right plantar medial heel seems better. We have been using Hydrofera Blue 12/12/18 on evaluation today patient appears to be doing about the same with regard to her wound based on prior measurements. She does have some necrotic tissue noted on the lateral aspect of the wound that is going require a little bit of sharp debridement today. This includes what appears to be potentially either severely necrotic bone or tendon. Nonetheless other than that she does not appear to have any severe infection which is good news 9/18; it is been 2 weeks since I saw this wound. She is tolerating HBO well. Continued dramatic improvement in the area on the right dorsal foot. She still has a small wound on the heel that we have been using Hydrofera Blue. She continues with a wound VAC 9/24; patient has to be seen emergently today with a swelling on her right lateral lower leg. She says that she told Dr. Evette Doffing about this and also myself on a couple of occasions but I really have no recollection of this. She is not systemically unwell and her wound really looked good the last time I saw this. She showed this to providers at dialysis and she was able to verify that she was started on cephalexin today for 5 doses at dialysis. She dialyzes on Tuesday Thursday and Saturday. 10/2; patient is seen in conjunction with HBO. The area that is draining on the right anterior medial tibia is more extensive. Copious amounts of serosanguineous drainage with some purulence. We are still using the wound VAC on the original wound then it is stable. Culture I did of the original IandD showed MRSA I contacted dialysis she is now on vancomycin with dialysis treatments. I asked them to run a month 10/9; patient seen in conjunction with HBO. She had a new spontaneous open area just above the wound on the right medial tibia ankle. More swelling on the right medial tibia. Her  wound on the foot looks about the same perhaps slightly better. There is no warmth spreading up her leg but no obvious erythema. her MRI of the foot and ankle and distal tib-fib is not booked for next Friday I discussed this with her in great detail  over multiple days. it is likely she has spreading infection upper leg at least involving the distal 25% above the ankle. She knows that if I refer her to orthopedics for infectious disease they are going to recommend amputation and indeed I am not against this myself. We had a good trial at trying to heal the foot which is what she wanted along with antibiotics debridement and HBO however she clearly has spreading infection [probably staph aureus/MRSA]. Nevertheless she once again tells me she wants to wait the left of the MRI. She still makes comments about having her amputation done in [place]. 10/19; arrives today with significant swelling on the lateral right leg. Last culture I did showed Klebsiella. Multidrug-resistant. Cipro was intermediate sensitivity and that is what I have her on pending her MRI which apparently is going to be done on Thursday this week although this seems to be moving back and forth. She is not systemically unwell. We are using silver alginate on her major wound area on the right medial foot and the draining areas on the right lateral lower leg 10/26; MRI showed extensive abscess in the anterior compartment of the right leg also widespread osteomyelitis involving osseous structures of the midfoot and portions of the hindfoot. Also suspicion for osteomyelitis anterior aspect of the distal medial malleolus. Culture I did of the purulence once again showed a multidrug-resistant Klebsiella. I have been in contact with nephrology late last week and she has been started on cefepime at dialysis to replace the vancomycin We sent a copy of her MRI report to Dr. Geroge Baseman in [place] who is an orthopedic surgeon. The patient takes  great stock in his opinion on this. She says she will go to [place] to have her leg amputated if Dr. Geroge Baseman does not feel there is any salvage options. 11/2; she still is not talk to her orthopedic surgeon in [place]. Apparently he will call her at [phone] this afternoon. The quality of this is she has not allowed me to refer her anywhere. She has been told over and over that she needs this amputated but has not agreed to be referred. She tells me her blood sugar was 600 last night but she has not been febrile. 11/9; she never did got a call from the orthopedic surgeon in [place] therefore that is off the radar. We have arranged to get her see orthopedic surgery at [facility]. She still has a lot of draining purulence coming out of the new abscess in her right leg although that probably came from the osteomyelitis in her right foot and heel. Meanwhile the original wound on the right foot looks very healthy. Continued improvement. The issue is that the last MRI showed osteomyelitis in her right foot extensively she now has an abscess in the right anterior lower leg. There is nobody in [place] who will offer this woman anything but an amputation and to be honest that is probably what she needs. I think she still wants to talk about limb salvage although at this point I just do not see that. She has completed her vancomycin at dialysis which was for the original staph aureus she is still on cefepime for the more recent Klebsiella. She has had a long course of both of these antibiotics which should have benefited the osteomyelitis on the right foot as well as the abscess. 11/16; apparently [place] elective surgery is shut down because of COVID-19 pandemic. I have reached out to some contacts at [facility] to see if we  can get her an orthopedic appointment there. I am concerned about continually leaving this but for the moment everything is static. In fact her original large wound on this foot  is closing down. It is the abscess on the right anterior leg that continues to drain purulent serosanguineous material. She is not currently on any antibiotics however she had a prolonged course of vancomycin [1 month] as well as cefepime for a month 02/24/2019 on evaluation today patient appears to be doing better than the last time I saw her. This is not a patient that I typically see. With that being said I am covering for Dr. Dellia Nims this week and again compared to when I last saw her overall the wounds in particular seem to be doing significantly better which is good news. With that being said the patient tells me several disconcerting things. She has not been able to get in to see anyone for potential debridement in regard to her leg wounds although she tells me that she does not think it is necessary any longer because she is taking care of that herself. She noticed a string coming out of the lower wound on her leg over the last week. The patient states that she subsequently decided that we must of pack something in there and started pulling the string out and as it kept coming and coming she realized this was likely her tendon. With that being said she continued to remove as much of this as she could. She then I subsequently proceeded to using tubes of antibiotic ointment which she will stick down into the wound and then scored as much as she can until she sees it coming out of the other wound opening. She states that in doing this she is actually made things better and there is less redness and irritation. With regard to her foot wound she does have some necrotic tendon and tissue noted in one small corner but again the actual wound itself seems to be doing better with good granulation in general compared to my last evaluation. 12/7; continued improvement in the wound on the substantial part of the right medial foot. Still a necrotic area inferiorly that required debridement but the rest of this  looks very healthy and is contracting. She has 2 wounds on the right lateral leg which were her original drainage sites from her abscess but all of this looks a lot better as well. She has been using silver alginate after putting antibiotic biotic ointment in one wound and watching it come out the other. I have talked to her in some detail today. I had given her names of orthopedic surgeons at [facility] for second opinion on what to do about the right leg. I do not think the patient never called them. She has not been able to get a hold of the orthopedic surgeon in [place] that she had put a lot of faith in as being somebody would give her an opinion that she would trust. I talked to her today and said even if I could get her in to another orthopedic surgeon about the leg which she accept an amputation and she said she would not therefore I am not going to press this issue for the moment 12/14; continued improvement in his substantial wound on the right medial foot. There is still a necrotic area inferiorly with tightly adherent necrotic debris which I have been working on debriding each time she is here. She does not have an orthopedic appointment. Since  last time she was here I looked over her cultures which were essentially MRSA on the foot wound and gram-negative rods in the abscess on the anterior leg. 12/21; continued improvement in the area on the right medial foot. She is not up on this much and that is probably a good thing since I do not know it could support continuous ambulation. She has a small area on the right lateral leg which were remanence of the IandD's I did because of the abscess. I think she should probably have prophylactic antibiotics I am going to have to look this over to see if we can make an intelligent decision here. In the meantime her major wound is come down nicely. Necrotic area inferiorly is still there but looks a lot better 04/06/2019; she has had some improvement  in the overall surface area on the right medial foot somewhat narrowedr both but somewhat longer. The areas on the right lateral leg which were initial IandD sites are superficial. Nothing is present on the right heel. We are using silver alginate to the wound areas 1/18; right medial foot somewhat smaller. Still a deep probing area in the most distal recess of the wound. She has nothing open on the right leg. She has a new wound on the plantar aspect of her left fourth toe which may have come from just pulling skin. The patient using Medihoney on the wound on her foot under silver alginate. I cannot discourage her from this 2/1; 2-week follow-up using silver alginate on the right foot and her left fourth toe. The area on the right dorsal foot is contracted although there is still the deep area in the most distal part of the wound but still has some probing depth. No overt infection 2/15; 2-week follow-up. She continues to have improvement in the surface area on the dorsal right foot. Even the tunneling area from last time is almost closed. The area that was on the plantar part of her left fourth toe over the PIP is indeed closed 3/1; 2-week follow-up. Continued improvement in surface area. The original divot that we have been debriding inferiorly I think has full epithelialization although the epithelialization is gone down into the wound with probably 4 mm of depth. Even under intense illumination I am unable to see anything open here. The remanence of the wound in this area actually look quite healthy. We have been using silver alginate 3/15; 2-week follow-up. Unfortunately not as good today. She has a comma shaped wound on the dorsal foot however the upper part of this is larger. Under illumination debris on the surface She also tells [place] that she was on her right leg 2 times in the last couple of weeks mostly to reach up for things above her head etc. She felt a sharp pain in the right leg which  she thinks is somewhere from the ankle to the knee. The patient has neuropathy and is really uncertain. She cannot feel her foot so she does not think it was coming from there 3/29; 2-week follow-up. Her wound measures smaller. Surface of the wound appears reasonable. She is using silver alginate with underlying Medihoney. She has home health. X-rays I did of her tib-fib last time were negative although it did show arterial calcification 4/12; 2-week follow-up. Her wound measures smaller in length. Using manuka honey with silver alginate on top. She has home health. 4/26; 2-week follow-up. Her wound is smaller but still very adherent debris under illumination requiring debridement she has  been using manuka honey with silver alginate. 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 Been using Hydrofera Blue. She is on suppressive Bactrim but apparently she has been taking it twice daily 6/7; I have not seen this wound and about 6 weeks. Since then she was up in [place]. By her own admission she was walking on the foot because she did not have a wheelchair. The wound is not nearly as healthy looking as it was the last time I saw this. We ordered different things for her but she only uses Medihoney and silver alginate. As far as I know she is on suppressive trimethoprim sulfamethoxazole. She does not admit to any fever or chills. Her CBGs apparently are at baseline however she is saying that she feels some discomfort on the lateral part of her ankle I looked over her last inflammatory markers from the summer 2020 at which time she had a deeply necrotic infected wound in this area. On 11/10/2018 her sedimentation rate was 56 and C-reactive protein 9.9. This was 107 and 29 on 07/29/2018. Electronic Signature(s) Signed: 09/07/2019 8:16:21 PM By: Nancy Ham MD Entered By: [name] on 09/07/2019  18:21:34 -------------------------------------------------------------------------------- Physical Exam Details Patient Name: Date of Service: [name] 09/07/2019 2:45 PM Medical Record Number: [id number] Patient Account Number: [id number] Date of Birth/Sex: Treating RN: 06/13/1970 (49 y.o. [name] Primary Care Provider: Sanjuana [name], [name] Other Clinician: Referring Provider: Treating Provider/Extender: [name], [name] Weeks in Treatment: 52 Cardiovascular Her pedal pulses are palpable. Musculoskeletal Some warmth about the right ankle but no obvious effusion no obvious erythema. Psychiatric appears at normal baseline. Notes Wound exam; forefoot. This is not nearly as healthy looking as it was 6 weeks ago. I removed necrotic black eschar from the superior rim of the wound some adherent necrotic debris from the mid part of the wound. Nothing probes deeply although I did culture the area in the mid part of the wound post debridement. There was no purulence however Electronic Signature(s) Signed: 09/07/2019 8:16:21 PM By: Nancy Ham MD Entered By: [name] on 09/07/2019 18:22:45 -------------------------------------------------------------------------------- Physician Orders Details Patient Name: Date of Service: [name] 09/07/2019 2:45 PM Medical Record Number: [id number] Patient Account Number: [id number] Date of Birth/Sex: Treating RN: [birth year] (49 y.o. [name] Primary Care Provider: Sanjuana [name], [name] Other Clinician: Referring Provider: Treating Provider/Extender: [name], [name] Weeks in Treatment: 30 Verbal / Phone Orders: No Diagnosis Coding ICD-10 Coding Code Description M86.671 Other chronic osteomyelitis, right ankle and foot L97.514 Non-pressure chronic ulcer of other part of right foot with necrosis of bone E10.621 Type 1 diabetes mellitus with foot ulcer Follow-up  Appointments Return Appointment in 1 week. Dressing Change Frequency Wound #43 Right,Medial Foot Change dressing three times week. Wound Cleansing Clean wound with Wound Cleanser - Anasept to all wounds Primary Wound Dressing Wound #43 Right,Medial Foot Medihoney Alginate Secondary Dressing Wound #43 Right,Medial Foot Kerlix/Rolled Gauze Dry Gauze Edema Control Elevate legs to the level of the heart or above for 30 minutes daily and/or when sitting, a frequency of: - throughout the day [facility] for wound care. - Interim Laboratory naerobe culture (MICRO) - Right foot - (ICD10 E10.621 - Type 1 diabetes mellitus with foot Bacteria identified in Unspecified specimen  by A ulcer) LOINC Code: 093-2 Convenience Name: Anerobic culture Radiology X-ray, right foot - Non healing ulcer, pain right foot - (ICD10 L97.514 - Non-pressure chronic ulcer of other part of right foot with necrosis of bone) Electronic Signature(s) Signed: 09/07/2019 6:28:13 PM By: Nancy Hurst RN, BSN Signed: 09/07/2019 8:16:21 PM By: Nancy Ham MD Entered By: [name] on 09/07/2019 16:20:43 Prescription 09/07/2019 -------------------------------------------------------------------------------- Margorie John L. Nancy Ham MD Patient Name: Provider: [birth year] [id number] Date of Birth: NPI#: [id number] Sex: DEA #: [phone] [id number] Phone #: License #: [place] Patient Address: [address] [place], [place] [address] [place], [place] [address] [phone] Allergies Name Reaction Severity Cleocin diarrhea, itching Moderate lisinopril hyperkalemia Moderate Provider's Orders X-ray, right foot - ICD10: L97.514 - Non healing ulcer, pain right foot Hand Signature: Date(s): Electronic Signature(s) Signed: 09/07/2019 6:28:13 PM By: Nancy Hurst RN, BSN Signed:  09/07/2019 8:16:21 PM By: Nancy Ham MD Entered By: [name] on 09/07/2019 16:20:45 -------------------------------------------------------------------------------- Problem List Details Patient Name: Date of Service: [name] 09/07/2019 2:45 PM Medical Record Number: [id number] Patient Account Number: [id number] Date of Birth/Sex: Treating RN: 24-Apr-1970 (49 y.o. [name] Primary Care Provider: Sanjuana [name], [name] Other Clinician: Referring Provider: Treating Provider/Extender: [name], [name] Weeks in Treatment: 49 Active Problems ICD-10 Encounter Code Description Active Date MDM Code Description Active Date MDM Diagnosis M86.671 Other chronic osteomyelitis, right ankle and foot 09/03/2018 No Yes L97.514 Non-pressure chronic ulcer of other part of right foot with necrosis of bone 09/03/2018 No Yes E10.621 Type 1 diabetes mellitus with foot ulcer 09/24/2018 No Yes Inactive Problems ICD-10 Code Description Active Date Inactive Date L97.521 Non-pressure chronic ulcer of other part of left foot limited to breakdown of skin 04/20/2019 04/20/2019 L97.812 Non-pressure chronic ulcer of other part of right lower leg with fat layer exposed 02/24/2019 02/24/2019 Resolved Problems ICD-10 Code Description Active Date Resolved Date L02.415 Cutaneous abscess of right lower limb 12/25/2018 12/25/2018 Electronic Signature(s) Signed: 09/07/2019 8:16:21 PM By: Nancy Ham MD Entered By: [name] on 09/07/2019 18:16:35 -------------------------------------------------------------------------------- Progress Note Details Patient Name: Date of Service: [name] 09/07/2019 2:45 PM Medical Record Number: [id number] Patient Account Number: [id number] Date of Birth/Sex: Treating RN: Jul 14, 1970 (49 y.o. [name] Primary Care Provider: Sanjuana [name], [name] Other Clinician: Referring Provider: Treating Provider/Extender: [name], [name] Weeks in Treatment: 2 Subjective History of Present Illness (HPI) 49 year old diabetic who is known to have type 1 diabetes which is poorly controlled last hemoglobin A1c was 11%. She comes in with a ulcerated area on the left lateral foot which has been there for over 6 months. Was recently she has been treated by Dr. Amalia [name] of podiatry who saw her last on 05/28/2016. Review of his notes revealed that the patient had incision and drainage with placement of antibiotic beads to the left foot on 04/11/2016 for possible osteomyelitis of the cuboid bone. Over the last year she's had a history of amputation of the left fifth toe and a femoropopliteal popliteal bypass graft somewhere in April 2017. 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 His note Dr. Amalia [name] mentions that the patient has been referred to me for further wound care and possibly great candidate for hyperbaric oxygen therapy due to recurrent osteomyelitis. However we do not have any x-rays of biopsy reports  confirming this. He has been on several antibiotics including Bactrim and most recently is on doxycycline for an MRSA. I understand, the patient was not a candidate for IV antibiotics as she has had previous PICC lines which resulted in blood clots in both arms. There was a x-ray report dated 04/04/2016 on Dr. Amalia [name] notes which showed evidence of fifth ray resection left foot with osteolytic changes noted to the fourth metatarsal and cuboid bone on the left. 06/13/2016 -- had a left foot x-ray which showed no acute fracture or dislocation and no definite radiographic evidence of osteomyelitis. Advanced osteopenia was seen. 06/20/2016 -- she has noticed a new wound on the right plantar foot in the region where she had a callus before. 06/27/16- the patient did have her x-ray of the right foot which showed no findings to suggest osteomyelitis. She saw her endocrinologist, [name], yesterday. Her  A1c in January was 11. He also indicates mismanagement and noncompliance regarding her diabetes. She is currently on Bactrim for a lip infection. She is complaining of nausea, vomiting and diarrhea. She is unable to articulate the exact orders or dosing of the Bactrim; it is unclear when she will complete this. 07/04/2016 -- results from [facility] of ABIs with ankle waveforms were noted from 02/14/2016. The examination done on 06/27/2015 showed noncompressible ABIs with the right being 1.45 and the left being 1.33. The present examination showed a right ABI of 1.19 on the left of 1.33. The conclusion was that right normal ABI in the lower extremity at rest however compared to previous study which was noncompressible ABI may be falsely elevated side suggesting medial calcification. The left ABI suggested medial calcification. 08/01/2016 -- the patient had more redness and pain on her right foot and did not get to come to see as noted she see her PCP or go to the ER and decided to take some leftover metronidazole which she had at home. As usual, the patient does report she feels and is rather noncompliant. 08/08/2016 -- -- x-ray of the right foot -- FINDINGS:Transmetatarsal amputation is noted. No bony destruction is noted to suggest osteomyelitis. IMPRESSION: No evidence of osteomyelitis. Postsurgical changes are seen. MRI would be more sensitive for possible bony changes. Culture has grown Serratia Marcescens -- sensitive to Bactrim, ciprofloxacin, ceftazidime she was seen by Dr. Daylene [name] on 08/06/2016. He did not find any exposed bone, muscle, tendon, ligament or joint. There was no malodor and he did a excisional debridement in the office. ============ Old notes: 49 year old patient who is known to the wound clinic for a while had been away from the wound clinic since 09/01/2014. Over the last several months she has been admitted to various hospitals including [facility] [place]. She  was treated for a right metatarsal osteomyelitis with a transmetatarsal amputation and this was done about 2 months ago. He has a small ulcerated area on the right heel and she continues to have an ulcerated area on the left plantar aspect of the foot. The patient was recently admitted to the [facility] hospital group between 7/12 and 10/18/2014. she was given 3 weeks of IV vancomycin and was to follow-up with her surgeons at [facility] and also took oral vancomycin for C. difficile colitis. 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  MRI of the left foot done on 10/14/2014 showed no abscess or osteomyelitis. 04/27/15; this is a patient we know from previous stays in the wound care center. She is a type I diabetic I am not sure of her control currently. Since the last time I saw her she is had a right transmetatarsal amputation and has no wounds on her right foot and has no open wounds. She is been followed at the wound care center at [facility] in [place]. She comes today with the desire to undergo hyperbaric treatment locally. Apparently one of her wound care providers in [place] has suggested hyperbarics. This is in response to an MRI from 04/18/15 that showed increased marrow signal and loss of the proximal fifth metatarsal cortex evidence of osteomyelitis with likely early osteomyelitis in the cuboid bone as well. She has a large wound over the base of the fifth metatarsal. She also has a eschar over her the tips of her toes on 1,3 and 5. She does not have peripheral pulses and apparently is going for an angiogram tomorrow which seems reasonable. After this she is going to infectious disease at [facility].  They have been using Medihoney to the large wound on the lateral aspect of the left foot to. The patient has known Charcot deformity from diabetic neuropathy. She also has known diabetic PAD. Surprisingly I can't see that she has had any recent antibiotics, the patient states the last antibiotic she had was at the end of November for 10 days. I think this was in response to culture that showed group G strep although I'm not exactly sure where the culture was from. She is also had arterial studies on 03/29/15. This showed a right ABI of 1.4 that was noncompressible. Her left ABI was 0.73. 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 It was not felt that arterial inflow was adequate for healing of a foot ulcer. Her Doppler waveforms looked monophasic ===== READMISSION 02/28/17; this is in an now 49 year old woman we've had at several different occasions in this clinic. 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 She has a remote ex-smoker. She was last seen in this clinic by Dr. Con Memos I think in May. More recently she is been followed by her podiatrist Dr. Amalia [name] an infectious disease Dr. Megan Salon. She has 2 open wounds the major one is over the right first metatarsal head she also has a wound on the left plantar foot. an MRI of the right foot on 01/01/17 showed a soft tissue ulcer along the plantar aspect of the first metatarsal base consistent with osteomyelitis of the first metatarsal stump. Dr. 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 According to the patient this is been open for slightly over a month. She has been on a combination of Cipro 500 twice a day, Zyvox 600 twice a day and Flagyl 500 3 times a day for over a month now as directed by Dr. Megan Salon. cultures of the right foot earlier this year showed MRSA in January and Serratia in May. January also had a few  viridans strep. Recent x-rays of both feet were done and Dr. Amalia [name] office and I don't have these reports. The patient has known PAD and has a history of aleft femoropopliteal bypass in April 2017. 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 Dwyane Dee. In spite of this the last hemoglobin A1c I can see is 10.1 on 01/01/2017. She is being referred by Dr. 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 She is been using Medihoney to this area and also an area on the left midfoot. She is using healing sandals bilaterally. ABIs in this clinic at the left posterior tibial was 1.1 noncompressible on the right READMISSION Non invasive vascular NOVANT 5/18 Aftercare following surgery of the circulatory system Procedure Note - Interface, External Ris In - 08/13/2016 11:05 AM EDT Procedure: Examination consists of physiologic resting arterial pressures of the brachial and ankle arteries bilaterally with continuous wave Doppler waveform analysis. Previous: Previous exam performed on 02/14/16 demonstrated ABIs of Rt = 1.19 and Lt = 1.33. Right: ABI = non-compressible PT 1.47 DP. S/P transmet amputation. , Left: ABI = 1.52, 2nd digit pressure = 87 mmHg Conclusions: Right: ABI (>1.3) may be falsely elevated, suggesting medial calcification. Left: ABI (>1.3) may be falsely elevated, suggesting medial calcification The patient is a now 49 year old type I diabetic is had multiple issues her graded to chronic diabetic foot ulcers. 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 We had her in the clinic lastin November. At that point she had wounds on her bilateral feet.she had wanted to try hyperbarics  however the healogics review process denied her because she hadn't followed up with her vascular surgeon for her left femoropopliteal bypass. The bypass was done by Dr. Raul Del at [facility]. We made her a follow-up with Dr. Raul Del however she did not keep the appointment and therefore she was not approved The patient shows me a small wound on her left fourth metatarsal head on her phone. She developed rapid discoloration in the plantar aspect of the left foot and she was admitted to hospital from 2/2 through 05/10/17 with wet gangrene of the left foot osteomyelitis of the fourth metatarsal heads. She was admitted acutely ill with a temperature of 103. She was started on broad-spectrum vancomycin and cefepime. On 05/06/17 she was taken to the OR by Dr. Amalia [name] her podiatric surgeon for an incision and drainage irrigation of the left foot wound. Cultures from this surgery revealed group be strep and anaerobes. she was seen by Dr.Xu of orthopedic surgery and scheduled for a below-knee amputation which she u refused. Ultimately she was discharged on Levaquin and Flagyl for one month. MRI 05/05/17 done while she was in the hospital showed abscess adjacent to the fourth metatarsal head and neck small abscess around the fourth flexor tendon. Inflammatory phlegmon and gas in the soft tissues along the lateral aspect of the fourth phalanx. Findings worrisome for osteomyelitis involving the fourth proximal and middle phalanx and also the third and fourth metatarsals. Finally the patient had actually shortly before this followed up with Dr. Raul Del at no time on 04/29/17. He felt that her left femoropopliteal bypass was patent he felt that her left-sided toe pressures more than adequate for healing a wound on the left foot. This was before her acute presentation. 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 05/28/17; she is started hyperbarics. The patient tells me that for some reason she was not actually on Levaquin  but I think on ciprofloxacin. She was on Flagyl. She only started her Levaquin yesterday due to some difficulty with the pharmacy and  perhaps her sister picking it up. She has an appointment with Dr. Amalia [name] tomorrow and with infectious disease early next week. She has no new complaints 06/06/17; the patient continues in hyperbarics. She saw Dr. Amalia [name] on 05/29/17 who is her podiatric surgeon. He is elected for a transmetatarsal amputation on 06/27/17. I'm not sure at what level he plans to do this amputation. The patient is unaware ooShe also saw Dr. Megan Salon of infectious disease who elected to continue her on current antibiotics I think this is ciprofloxacin and Flagyl. I'll need to clarify with her tomorrow if she actually has this. We're using silver alginate to the actual wound. Necrotic surface today with material under the flap of her foot. ooOriginal MRI showed abscesses as well as osteomyelitis of the proximal and middle fourth phalanx and the third and fourth metatarsal heads 06/11/17; patient continues in hyperbarics and continues on oral antibiotics. She is doing well. The wound looks better. The necrotic part of this under the flap in her superior foot also looks better. she is been to see Dr. Amalia [name]. I haven't had a chance to look at his note. Apparently he has put the transmetatarsal amputation on hold her request it is still planning to take her to the OR for debridement and product application ACEL. I'll see if I can find his note. I'll therefore leave product ordering/requests to Dr. Amalia [name] for now. I was going to look at Dermagraft 06/18/17-she is here in follow-up evaluation for bilateral foot wounds. She continues with hyperbaric therapy. She states she has been applying manuka honey to the right plantar foot and alternate manuka honey and silver alginate to the left foot, despite our orders. We will continue with same treatment plan and she will follo up next week. 06/25/17; I have  reviewed Dr. Amalia [name] last note from 3/11. She has operative debridement in 2 days' time. By review his note apparently they're going to place there is skin over the majority of this wound which is a good choice. She has a small satellite area at the most proximal part of this wound on the left plantar foot. The area on the right plantar foot we've been using silver alginate and it is close to healing. 07/02/17; unfortunately the patient was not easily approved for Dr. Amalia [name] proposed surgery. I'm not completely certain what the issue is. She has been using silver alginate to the wound she has completed a first course of hyperbarics. She is still on Levaquin and Flagyl. I have really lost track of the time course here.I suspect she should have another week to 2 of antibiotics. I'll need to see if she is followed up with infectious disease Dr. Megan Salon 07/09/17; the patient is followed up with Dr. Megan Salon. 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 She continues on Levaquin and metronidazole continuing both of these for now I think she is been on fr about 6 weeks. She still has some drainage but no pain. No fever. Her had been plans for her to go to the OR for operative debridement with her podiatrist Dr. Amalia [name], I am not exactly sure where that is. I'll probably slip a note to Dr. Amalia [name] today. I note that she follows with Dr. Dwyane Dee of endocrinology. We have her recertified for hyperbaric oxygen. I have not heard about Dermagraft however I'll see if Dr. Amalia [name] is planning a skin substitute as well 07/16/17; the patient tells me she is just about out of  Levaquin. I'll need to check Dr. Hale Bogus last notes on this. She states she has plenty of Flagyl however. She comes in today complaining of pain in the right lateral foot which she said lasted for about a day. The wound on the right foot is actually much more medially. She also tells me that the [facility] cost a lot  of pain in the left foot wound and she turned back to silver alginate. Finally Dermagraft has a $209 per application co-pay. She cannot afford this 07/23/17; patient arrives today with the wound not much smaller. There is not much new to add. She has not heard from Dr. Amalia [name] all try to put in a call to them today. She was asking about Dermagraft again and she has an over $470 per application co-pay she states that she would be willing to try to do a payment plan. I been tried to avoid this. We've been using silver alginate, I'll change to [facility] 07/30/17-She is here in follow-up evaluation for left foot ulcer. She continues hyperbaric medicine. The left foot ulcer is stable we will continue with same treatment plan 08/06/17; she is here for evaluation of her left foot ulcer. Currently being treated for hyperbarics or underlying osteomyelitis. She is completed antibiotics. The left foot ulcer is better smaller with healthier looking granulation. For various reasons I am not really clear on we never got her back to the OR with Dr. Amalia [name]. He did not respond to my secure text message. Nevertheless I think that surgery on this point is not necessary nor am I completely clear that a skin substitute is necessary The patient is complaining about pain on the outside of her right foot. 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 There is no erythema. She also states the foot is warm versus her other part of her upper leg and this is largely true. It is not totally clear to me what's causing this. She thinks it's different from her usual neuropathy pain 08/13/17; she arrives in clinic today with a small wound which is superficial on her right first metatarsal head. 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 She tells [place] she was up on her feet over the Mother's Day celebration. ooThe large wound is on the left foot. Continues with hyperbarics for underlying osteomyelitis. We're  using Hydrofera Blue. She asked me today about where we were with Dermagraft. I had actually excluded this because of the co-pay however she wants to assume this therefore I'll recheck the co-pay an order for next week. 08/20/17; the patient agreed to accept the co-pay of the first Apligraf which we applied today. She is disappointed she is finishing hyperbarics will run this through the insurance on the extent of the foot infection and the extent of the wound that she had however she is already had 60 dive's. Dermagraft No. 1 08/27/17; Dermagraft No. 2. She is not eligible for any more hyperbaric treatments this month. She reports a fair amount of drainage and she actually changed to the external dressings without disturbing the direct contact layer 09/03/17; the patient arrived in clinic today with the wound superficially looking quite healthy. Nice vibrant red tissue with some advancing epithelialization although not as much adherence of the flap as I might like. However she noted on her own fourth toe some bogginess and she brought that to our attention. Indeed this was boggy feeling like a possibility of subcutaneous fluid. She stated that this was similar to how an  issue came up on the lateral foot that led to her fifth ray amputation. She is not been unwell. We've been using Dermagraft 09/10/17; the culture that I did not last week was MRSA. She saw Dr. Megan Salon this morning who is going to start her on vancomycin. I had sent him a secure a text message yesterday. I also spoke with her podiatric surgeon Dr. Amalia [name] about surgery on this foot the options for conserving a functional foot etc. Promised me he would see her and will make back consultation today. Paradoxically her actual wound on the plantar aspect of her left foot looks really quite good. I had given her 5 days worth of Baxdella to cover her for MRSA. Her MRI came back showing osteomyelitis within the third metatarsal shaft and head and  base of the third and fourth proximal phalanx. She had extensive inflammatory changes throughout the soft tissue of the lateral forefoot. With an ill-defined fluid around the fourth metatarsal extending into the plantar and dorsal soft tissues 09/19/17; the patient is actually on oral Septra and Flagyl. She apparently refused IV vancomycin. She also saw Dr. Amalia [name] at my request who is planning her for a left BKA sometime in mid July. MRI showed osteomyelitis within the third metatarsal shaft and head and the basis of the third and fourth proximal phalanx. I believe there was felt to be possible septic arthritis involving the third MTP. 09/26/17; the patient went back to Dr. Megan Salon at my suggestion and is now receiving IV daptomycin. Her wound continues to look quite good making the decision to proceed with a transmetatarsal amputation although more difficult for the patient. I believe in my extensive discussions with her she has a good sense of the pros and cons of this. I don't NV the tuft decision she has to make. She has an appointment with Dr. Amalia [name] I believe in mid July and I previously spoken to him about this issue Has we had used 3 previous Dermagraft. Given the condition of the wound surface I went ahead and added the fourth one today area and I did this not fully realizing that she'll be traveling to [place] next week. I'm hopeful she can come back in 2 weeks 10/21/17; Her same Dermagraft on for about 3-1/2 weeks. In spite of this the wound arrives looking quite healthy. There is been a lot of healing dimensions are smaller. Looking at the square shaped wound she has now there is some undermining and some depth medially under the undermining although I cannot palpate any bone. No surrounding infection is obvious. She has difficult questions about how to look at this going forward vis--vis amputations versus continued medical therapy. T be truthful the wound is looks so o healthy and it is  continued to contract. Hard to justify foot surgery at this point although I still told her that I think it might come to that if we are not able to eradicate the underlying MRSA. She is still highly at risk and she understands this 11/06/17 on evaluation today patient appears to be doing better in regard to her foot ulcer. She's been tolerating the dressing changes without complication. Currently she is here for her Dermagraft #6. Her wound continues to make excellent progress at this point. She does not appear to have any evidence of infection which is good news. 11/13/17 on evaluation today patient appears to be doing excellent at this time. She is here for repeat Dermagraft application. This is #7. Overall her wound seems  to be making great progress. 12/05/17; the patient arrives with the wound in much better condition than when I last saw this almost 6 weeks ago. She still has a small probing area in the left metatarsal head region on the lateral aspect of her foot. We applied her last Dermagraft today. ooSince the last time she is here she has what appears to a been a blood blister on the plantar aspect of left foot although I don't see this is threatening. There is also a thick raised tissue on the right mid metatarsal head region. This was not there I don't think the last time she was here 3 weeks ago. 12/12/17; the patient continues to have a small programming area in the left metatarsal head region on the lateral aspect of her foot which was the initial large surgical wound. I applied her last Apligraf last week. I'm going to use Endoform starting today ooUnfortunately she has an excoriated area in the left mid foot and the right mid foot. The left midfoot looks like a blistered area this was not opened last week it certainly is open today. Using silver alginate on these areas. She promises me she is offloading this. 12/19/17; the small probing area in the left metatarsal head eyes think is  shallower. In general her original wound looks better. We've been using Endoform. The area inferiorly that I think was trauma last week still requires debridement a lot of nonviable surface which I removed. She still has an open open area distally in her foot ooSimilarly on the right foot there is tightly adherent surface debris which I removed. Still areas that don't look completely epithelialized. This is a small open area. We used silver alginate on these areas 12/26/2017; the patient did not have the supplies we ordered from last week including the Endoform. The original large wound on the left lateral foot looks healthy. She still has the undermining area that is largely unchanged from last week. She has the same heavily callused raised edged wounds on the right mid and left midfoot. Both of these requiring debridement. We have been using silver alginate on these areas 01/02/2018; there is still supply issues. We are going to try to use Prisma but I am not sure she actually got it from what she is saying. She has a new open area on the lateral aspect of the left fourth toe [previous fifth ray amputation]. Still the one tunneling area over the fourth metatarsal head. The area is in the midfoot bilaterally still have thick callus around them. She is concerned about a raised swelling on the lateral aspect of the foot. However she is completely insensate 01/10/2018; we are using Prisma to the wounds on her bilateral feet. Surprisingly the tunneling area over the left fourth metatarsal head that was part of her original surgery has closed down. She has a small open area remaining on the incision line. 2 open areas in the midfoot. 02/10/2018; the patient arrives back in clinic after a month hiatus. She was traveling to visit family in [place]. Is fairly clear she was not offloading the areas on her feet. The original wound over the left lateral foot at the level of metatarsal heads is reopened and  probes medially by about a centimeter or 2. She notes that a week ago she had purulent drainage come out of an area on the left midfoot. Paradoxically the worst area is actually on the right foot is extensive with purulent drainage. We will use silver  alginate today 02/17/2018; the patient has 3 wounds one over the left lateral foot. She still has a small area over the metatarsal heads which is the remnant of her original surgical wound. This has medial probing depth of roughly 1.4 cm somewhat better than last week. The area on the right foot is larger. We have been using silver alginate to all areas. The area on the right foot and left foot that we cultured last week showed both Klebsiella and Proteus. Both of these are quinolone sensitive. The patient put her's self on Bactrim and Flagyl that she had left hanging around from prior antibiotic usages. She was apparently on this last week when she arrived. I did not realize this. Unfortunately the Bactrim will not cover either 1 of these organisms. We will send in Cipro 500 twice daily for a week 03/04/2018; the patient has 2 wounds on the left foot one is the original wound which was a surgical wound for a deep DFU. At one point this had exposed bone. She still has an area over the fourth metatarsal head that probes about 1.4 cm although I think this is better than last week. I been using silver nitrate to try and promote tissue adherence and been using silver alginate here. ooShe also has an area in the left midfoot. This has some depth but a small linear wound. Still requiring debridement. ooOn the right midfoot is a circular wound. A lot of thick callus around this area. ooWe have been using silver alginate to all wound areas ooShe is completed the ciprofloxacin I gave her 2 weeks ago. 03/11/2018; the patient continues to have 2 open areas on the left foot 1 of which was the original surgical wound for a deep DFU. Only a small probing  area remains although this is not much different from last week we have been using silver alginate. The other area is on the midfoot this is smaller linear but still with some depth. We have been using silver alginate here as well ooOn the right foot she has a small circular wound in the mid aspect. This is not much smaller than last time. We have been using silver alginate here as well 03/18/2018; she has 3 wounds on the left foot the original surgical wound, a very superficial wound in the mid aspect and then finally the area in the mid plantar foot. She arrives in today with a very concerning area in the wound in the mid plantar foot which is her most proximal wound. There is undermining here of roughly 1-1/2 cm superiorly. Serosanguineous drainage. She tells me she had some pain on for over the weekend that shot up her foot into her thigh and she tells me that she had a nodule in the groin area. ooShe has the single wound in the right foot. ooWe are using endoform to both wound areas 03/24/2018; the patient arrives with the original surgical wound in the area on the left midfoot about the same as last week. There is a collection of fluid under the surface of the skin extending from the surgical wound towards the midfoot although it does not reach the midfoot wound. The area on the right foot is about the same. Cultures from last week of the left midfoot wound showed abundant Klebsiella abundant Enterococcus faecalis and moderate methicillin resistant staph I gave her Levaquin but this would have only covered the Klebsiella. She will need linezolid 04/01/2018; she is taking linezolid but for the first few days  only took 1 a day. I have advised her to finish this at twice daily dosing. In any case all of her wounds are a lot better especially on the left foot. The original surgical wound is closed. The area on the left midfoot considerably smaller. The area on the right foot also  smaller. 04/08/2018; her original surgical wound/osteomyelitis on the left foot remains closed. She has area on the left foot that is in the midfoot area but she had some streaking towards this. This is not connected with her original wound at least not visually. ooSmall wound on the right midfoot appears somewhat smaller. 04/15/18; both wounds looks better. Original wound is better left midfoot. Using silver alginate 1/21; patient states she uses saltwater soak in, stones or remove callus from around her wounds. She is also concerned about a blood blister she had on the left foot but it simply resolved on its own. We've been using silver alginate 1/28; the patient arrives today with the same streaking area from her metatarsals laterally [the site of her original surgical wound] down to the middle of her foot. There is some drainage in the subcutaneous area here. This concerns me that there is actually continued ongoing infection in the metatarsals probably the fourth and third. This fixates an MRI of the foot without contrast [chronic renal failure] ooThe wound in the mid part of the foot is small but I wonder whether this area actually connects with the more distal foot. ooThe area on the right midfoot is probably about the same. Callus thick skin around the small wound which I removed with a curette we have been using silver alginate on both wound areas 2/4; culture I did of the draining site on the left foot last time grew methicillin sensitive staph aureus. MRI of the left foot showed interval resolution of the findings surrounding the third metatarsal joint on the prior study consistent with treated osteomyelitis. Chronic soft tissue ulceration in the plantar and lateral aspect of the forefoot without residual focal fluid collection. No evidence of recurrent osteomyelitis. 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 She tells me she had nausea and vomiting over the weekend and missed 2 days. In general her foot looks much the same. She has a small open area just below the left fourth metatarsal head. A linear area in the left midfoot. Some discoloration extending from the inferior part of this into the left lateral foot although this appears to be superficial. She has a small area on the right midfoot which generally looks smaller after debridement 2/18; the patient is completing his cephalexin and has another 2 days. She continues to have open areas on the left and right foot. 2/25; she is now off antibiotics. The area on the left foot at the site of her original surgical wound has closed yet again. She still has open areas in the mid part of her foot however these appear smaller. The area on the right mid foot looks about the same. We have been using silver alginate She tells me she had a serious hypoglycemic spell at home. She had to have EMS called and get IV dextrose 3/3; disappointing on the left lateral foot large area of necrotic tissue surrounding the linear area.  This appears to track up towards the same original surgical wound. Required extensive debridement. The area on the right plantar foot is not a lot better also using silver 3/12; the culture I did last time showed abundant enterococcus. I have prescribed Augmentin, should cover any unrecognized anaerobes as well. In addition there were a few MRSA and Serratia that would not be well covered although I did not want to give her multiple antibiotics. She comes in today with a new wound in the right midfoot this is not connected with the original wound over her MTP a lot of thick callus tissue around both wounds but once again she said she is not walking on these areas 3/17-Patient  comes in for follow-up on the bilateral plantar wounds, the right midfoot and the left plantar wound. Both these are heavily callused surrounding the wounds. We are continuing to use silver alginate, she is compliant with offloading and states she uses a wheelchair fairly often at home 3/24; both wound areas have thick callus. However things actually look quite a bit better here for the majority of her left foot and the right foot. 3/31; patient continues to have thick callused somewhat irritated looking tissue around the wounds which individually are fairly superficial. There is no evidence of surrounding infection. We have been using silver alginate however I change that to [facility] today 4/17; patient returns to clinic after having a scare with Covid she tested negative in her primary doctor's office. She has been using Hydrofera Blue. She does not have an open area on the right foot. On the left foot she has a small open area with the mid area not completely viable. She showed me pictures of what looks like a hemorrhagic blister from several days ago but that seems to have healed over this was on the lateral left foot 4/21; patient comes in to clinic with both her wounds on her feet closed. However over the weekend she started having pain in her right foot and leg up into the thigh. She felt as though she was running a low-grade fever but did not take her temperature. She took a doxycycline that she had leftover and yesterday a single Septra and metronidazole. She thinks things feel somewhat better. 4/28; duplex ultrasound I ordered last week was negative for DVT or superficial thrombophlebitis. She is completed the doxycycline I gave her. States she is still having a lot of pain in the right calf and right ankle which is no better than last week. She cannot sleep. She also states she has a temperature of up to 101, coughing and complaining of visual loss in her bilateral eyes. Apparently she  was tested for Covid 2 weeks ago at [facility] and that was negative. Readmission: 09/03/18 patient presents back for reevaluation after having been evaluated at the end of April regarding erythema and swelling of her right lower extremity. Subsequently she ended up going to the hospital on 07/29/18 and was admitted not to be discharged until 08/08/18. Unfortunately it was noted during the time that she was in the hospital that she did have methicillin-resistant Staphylococcus aureus as the infection noted at the site. It was also determined that she did have osteomyelitis which appears to be fairly significant. She was treated with vancomycin and in fact is still on IV vancomycin at dialysis currently. This is actually slated to continue until 09/12/18 at least which will be the completion of the six weeks of therapy. Nonetheless based on what I'm seeing  at this point I'm not sure she will be anywhere near ready to discontinue antibiotics at that time. Since she was released from the hospital she was seen by Dr. Amalia [name] who is her podiatrist on 08/27/18. His note specifically states that he is recommended that the patient needs of one knee amputation on the right as she has a life- threatening situation that can lead quickly to sepsis. The patient advised she would like to try to save her leg to which Dr. Amalia [name] apparently told her that this was against all medical advice. She also want to discontinue the Wound VAC which had been initiated due to the fact that she wasn't pleased with how the wound was looking and subsequently she wanted to pursue applying Medihoney at that time. He stated that he did not believe that the right lower extremity was salvageable and that the patient understood but would still like to attempt hyperbaric option therapy if it could be of any benefit. She was therefore referred back to [place] for further evaluation. He plans to see her back next week. Upon inspection today patient has a  significant amount purulent drainage noted from the wound at this point. The bone in the distal portion of her foot also appears to be extremely necrotic and spongy. When I push down on the bone it bubbles and seeps purulent drainage from deeper in the end of the foot. I do not think that this is likely going to heal very well at all and less aggressive surgical debridement were undertaken more than what I believe we can likely do here in our office. 09/12/2018; I have not seen this patient since the most recent hospitalization although she was in our clinic last week. I have reviewed some of her records from a complex hospitalization. 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 There is situation was complicated by MRSA bacteremia and acute on chronic renal failure now on dialysis. She is receiving vancomycin at dialysis. We started her on Dakin's wet-to-dry last week she is changing this daily. There is still purulent drainage coming out of her foot. Although she is apparently "agreeable" to a below-knee amputation which is been suggested by multiple clinicians she wants this to be done in [place]. She apparently has a telehealth visit with that provider sometime in late [place] 6/24. I have told her I think this is probably too long. Nevertheless I could not convince her to allow a local doctor to perform BKA. 09/19/2018; the patient has a large necrotic area on the right anterior foot. She has had previous transmetatarsal amputations. Culture I did last week showed MRSA nothing else she is on vancomycin at dialysis. She has continued leaking purulent drainage out of the distal part of the large circular wound on the right anterior foot. She apparently went to see Dr. 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 Somehow that translated into her being referred to plastic surgery for debridement of the area. I gather she  basically refused amputation although I do not have a copy of Dr. Berenice Primas notes. The patient really wants to have a trial of hyperbaric oxygen. I agreed with initial assessment in this clinic that this was probably too far along to benefit however if she is going to have plastic surgery I think she would benefit from ancillary hyperbaric oxygen. The issue here is that the patient has benefited as maximally as any patient I have ever seen from  hyperbaric oxygen therapy. Most recently she had exposed bone on the lateral part of her left foot after a surgical procedure and that actually has closed. She has eschared areas in both heels but no open area. She is remained systemically well. I am not optimistic that anything can be done about this but the patient is very clear that she wants an attempt. The attempt would include a wound VAC further debridements and hyperbaric oxygen along with IV antibiotics. 6/26; I put her in for a trial of hyperbaric oxygen only because of the dramatic response she has had with wounds on her left midfoot earlier this year which was a surgical wound that went straight to her bone over the metatarsal heads and also remotely the left third toe. We will see if we can get this through our review process and insurance. She arrives in clinic with again purulent material pouring out of necrotic bone on the top of the foot distally. There is also some concerning erythema on the front of the leg that we marked. It is bit difficult to tell how tender this is because of neuropathy. I note from infectious disease that she had her vancomycin extended. 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 She had the wound VAC on for part of the week. The rest of the time she is putting various things on this including Medihoney, "ionized water" silver sorb gel etc. 7/7; follow-up along with HBO. She is still on vancomycin at dialysis. She has a large open area on the  dorsal right foot and a small dark eschar area on her heel. There is a lot less erythema in the area and a lot less tenderness. From an infection point of view I think this is better. She still has a lot of necrosis in the remaining right forefoot [previous TMA] we are still using the wound VAC in this area 7/16; follow-up along with HBO. I put her on linezolid after she finished her vancomycin. We started this last Friday I gave her 2 weeks worth. I had the expectation that she would be operatively debrided by Dr. Marla Roe but that still has not happened yet. Patient phoned the office this week. She arrives for review today after HBO. The distal part of this wound is completely necrotic. Nonviable pieces of tendon bone was still purulent drainage. Also concerning that she has black eschar over the heel that is expanding. I think this may be indicative of infection in this area as well. She has less erythema and warmth in the ankle and calf but still an abnormal exam 7/21 follow-up along with HBO. I will renew her linezolid after checking a CBC with differential monitoring her blood counts especially her platelets. She was supposed to have surgery yesterday but if I am reading things correctly this was canceled after her blood sugar was found to be over 500. I thought Dr. Marla Roe who called me said that they were sending her to the ER but the patient states that was not the case. 7/28. Follow-up along with HBO. She is on linezolid I still do not have any lab work from dialysis even though I called last week. The patient is concerned about an area on her left lateral foot about the level of the base of her fifth metatarsal. I did not really see anything that ominous here however this patient is in [place] ability to point out problems that she is sensing and she has been accurate in  the past Finally she received a call from Dr. Marla Roe who is referring her to another orthopedic surgeon stating  that she is too booked up to take her to the operating room now. Was still using a wound VAC on the foot 8/3 -Follow-up after HBO, she is got another week of linezolid, she is to call [facility] for an appointment, x-rays of both feet were reviewed, the left foot x-ray with third MTP joint osteo- Right foot x-ray widespread osteo-in the right midfoot Right ankle x-ray does not show any active evidence of infection 8/11-Patient is seen after HBO, the wounds on the right foot appear to be about the same, the heel wound had some necrotic base over tendon that was debrided with a curette 8/21; patient is seen after HBO. The patient's wound on her dorsal foot actually looks reasonably good and there is substantial amount of epithelialization however the open area distally still has a lot of necrotic debris partially bone. I cannot really get a good sense of just how deep this probes under the foot. She has been pressuring me this week to order medical maggots through a company in [place] for her. The problem I have is there is not a defined wound area here. On the positive side there is no purulence. She has been to see infectious disease she is still on Septra DS although I have not had a chance to review their notes 8/28; patient is seen in conjunction with HBO. The wounds on her foot continued to improve including the right dorsal foot substantially the, the distal part of this wound and the area on the right heel. We have been using a wound VAC over this chronically. She is still on trimethoprim as directed by infectious disease 9/4; patient is seen in conjunction with HBO. Right dorsal foot wound substantially anteriorly is better however she continues to have a deep wound in the distal part of this that is not responding. We have been using silver collagen under border foam ooArea on the right plantar medial heel seems better. We have been using Hydrofera Blue 12/12/18 on evaluation today patient  appears to be doing about the same with regard to her wound based on prior measurements. She does have some necrotic tissue noted on the lateral aspect of the wound that is going require a little bit of sharp debridement today. This includes what appears to be potentially either severely necrotic bone or tendon. Nonetheless other than that she does not appear to have any severe infection which is good news 9/18; it is been 2 weeks since I saw this wound. She is tolerating HBO well. Continued dramatic improvement in the area on the right dorsal foot. She still has a small wound on the heel that we have been using Hydrofera Blue. She continues with a wound VAC 9/24; patient has to be seen emergently today with a swelling on her right lateral lower leg. She says that she told Dr. Evette Doffing about this and also myself on a couple of occasions but I really have no recollection of this. She is not systemically unwell and her wound really looked good the last time I saw this. She showed this to providers at dialysis and she was able to verify that she was started on cephalexin today for 5 doses at dialysis. She dialyzes on Tuesday Thursday and Saturday. 10/2; patient is seen in conjunction with HBO. The area that is draining on the right anterior medial tibia is more extensive. Copious amounts  of serosanguineous drainage with some purulence. We are still using the wound VAC on the original wound then it is stable. Culture I did of the original IandD showed MRSA I contacted dialysis she is now on vancomycin with dialysis treatments. I asked them to run a month 10/9; patient seen in conjunction with HBO. She had a new spontaneous open area just above the wound on the right medial tibia ankle. More swelling on the right medial tibia. Her wound on the foot looks about the same perhaps slightly better. There is no warmth spreading up her leg but no obvious erythema. her MRI of the foot and ankle and distal tib-fib  is not booked for next Friday I discussed this with her in great detail over multiple days. it is likely she has spreading infection upper leg at least involving the distal 25% above the ankle. She knows that if I refer her to orthopedics for infectious disease they are going to recommend amputation and indeed I am not against this myself. We had a good trial at trying to heal the foot which is what she wanted along with antibiotics debridement and HBO however she clearly has spreading infection [probably staph aureus/MRSA]. Nevertheless she once again tells me she wants to wait the left of the MRI. She still makes comments about having her amputation done in [place]. 10/19; arrives today with significant swelling on the lateral right leg. Last culture I did showed Klebsiella. Multidrug-resistant. Cipro was intermediate sensitivity and that is what I have her on pending her MRI which apparently is going to be done on Thursday this week although this seems to be moving back and forth. She is not systemically unwell. We are using silver alginate on her major wound area on the right medial foot and the draining areas on the right lateral lower leg 10/26; MRI showed extensive abscess in the anterior compartment of the right leg also widespread osteomyelitis involving osseous structures of the midfoot and portions of the hindfoot. Also suspicion for osteomyelitis anterior aspect of the distal medial malleolus. Culture I did of the purulence once again showed a multidrug-resistant Klebsiella. I have been in contact with nephrology late last week and she has been started on cefepime at dialysis to replace the vancomycin We sent a copy of her MRI report to Dr. Geroge Baseman in [place] who is an orthopedic surgeon. The patient takes great stock in his opinion on this. She says she will go to [place] to have her leg amputated if Dr. Geroge Baseman does not feel there is any salvage options. 11/2; she still is not  talk to her orthopedic surgeon in [place]. Apparently he will call her at [phone] this afternoon. The quality of this is she has not allowed me to refer her anywhere. She has been told over and over that she needs this amputated but has not agreed to be referred. She tells me her blood sugar was 600 last night but she has not been febrile. 11/9; she never did got a call from the orthopedic surgeon in [place] therefore that is off the radar. We have arranged to get her see orthopedic surgery at [facility]. She still has a lot of draining purulence coming out of the new abscess in her right leg although that probably came from the osteomyelitis in her right foot and heel. Meanwhile the original wound on the right foot looks very healthy. Continued improvement. The issue is that the last MRI showed osteomyelitis in her right foot extensively she now  has an abscess in the right anterior lower leg. There is nobody in [place] who will offer this woman anything but an amputation and to be honest that is probably what she needs. I think she still wants to talk about limb salvage although at this point I just do not see that. She has completed her vancomycin at dialysis which was for the original staph aureus she is still on cefepime for the more recent Klebsiella. She has had a long course of both of these antibiotics which should have benefited the osteomyelitis on the right foot as well as the abscess. 11/16; apparently [place] elective surgery is shut down because of COVID-19 pandemic. I have reached out to some contacts at [facility] to see if we can get her an orthopedic appointment there. I am concerned about continually leaving this but for the moment everything is static. In fact her original large wound on this foot is closing down. It is the abscess on the right anterior leg that continues to drain purulent serosanguineous material. She is not currently on any antibiotics however she had a  prolonged course of vancomycin [1 month] as well as cefepime for a month 02/24/2019 on evaluation today patient appears to be doing better than the last time I saw her. This is not a patient that I typically see. With that being said I am covering for Dr. Dellia Nims this week and again compared to when I last saw her overall the wounds in particular seem to be doing significantly better which is good news. With that being said the patient tells me several disconcerting things. She has not been able to get in to see anyone for potential debridement in regard to her leg wounds although she tells me that she does not think it is necessary any longer because she is taking care of that herself. She noticed a string coming out of the lower wound on her leg over the last week. The patient states that she subsequently decided that we must of pack something in there and started pulling the string out and as it kept coming and coming she realized this was likely her tendon. With that being said she continued to remove as much of this as she could. She then I subsequently proceeded to using tubes of antibiotic ointment which she will stick down into the wound and then scored as much as she can until she sees it coming out of the other wound opening. She states that in doing this she is actually made things better and there is less redness and irritation. With regard to her foot wound she does have some necrotic tendon and tissue noted in one small corner but again the actual wound itself seems to be doing better with good granulation in general compared to my last evaluation. 12/7; continued improvement in the wound on the substantial part of the right medial foot. Still a necrotic area inferiorly that required debridement but the rest of this looks very healthy and is contracting. She has 2 wounds on the right lateral leg which were her original drainage sites from her abscess but all of this looks a lot better as  well. She has been using silver alginate after putting antibiotic biotic ointment in one wound and watching it come out the other. I have talked to her in some detail today. I had given her names of orthopedic surgeons at [facility] for second opinion on what to do about the right leg. I do not think the  patient never called them. She has not been able to get a hold of the orthopedic surgeon in [place] that she had put a lot of faith in as being somebody would give her an opinion that she would trust. I talked to her today and said even if I could get her in to another orthopedic surgeon about the leg which she accept an amputation and she said she would not therefore I am not going to press this issue for the moment 12/14; continued improvement in his substantial wound on the right medial foot. There is still a necrotic area inferiorly with tightly adherent necrotic debris which I have been working on debriding each time she is here. She does not have an orthopedic appointment. Since last time she was here I looked over her cultures which were essentially MRSA on the foot wound and gram-negative rods in the abscess on the anterior leg. 12/21; continued improvement in the area on the right medial foot. She is not up on this much and that is probably a good thing since I do not know it could support continuous ambulation. She has a small area on the right lateral leg which were remanence of the IandD's I did because of the abscess. I think she should probably have prophylactic antibiotics I am going to have to look this over to see if we can make an intelligent decision here. In the meantime her major wound is come down nicely. Necrotic area inferiorly is still there but looks a lot better 04/06/2019; she has had some improvement in the overall surface area on the right medial foot somewhat narrowedr both but somewhat longer. The areas on the right lateral leg which were initial IandD sites are  superficial. Nothing is present on the right heel. We are using silver alginate to the wound areas 1/18; right medial foot somewhat smaller. Still a deep probing area in the most distal recess of the wound. She has nothing open on the right leg. She has a new wound on the plantar aspect of her left fourth toe which may have come from just pulling skin. The patient using Medihoney on the wound on her foot under silver alginate. I cannot discourage her from this 2/1; 2-week follow-up using silver alginate on the right foot and her left fourth toe. The area on the right dorsal foot is contracted although there is still the deep area in the most distal part of the wound but still has some probing depth. No overt infection 2/15; 2-week follow-up. She continues to have improvement in the surface area on the dorsal right foot. Even the tunneling area from last time is almost closed. The area that was on the plantar part of her left fourth toe over the PIP is indeed closed 3/1; 2-week follow-up. Continued improvement in surface area. The original divot that we have been debriding inferiorly I think has full epithelialization although the epithelialization is gone down into the wound with probably 4 mm of depth. Even under intense illumination I am unable to see anything open here. The remanence of the wound in this area actually look quite healthy. We have been using silver alginate 3/15; 2-week follow-up. Unfortunately not as good today. She has a comma shaped wound on the dorsal foot however the upper part of this is larger. Under illumination debris on the surface She also tells [place] that she was on her right leg 2 times in the last couple of weeks mostly to reach up for things  above her head etc. She felt a sharp pain in the right leg which she thinks is somewhere from the ankle to the knee. The patient has neuropathy and is really uncertain. She cannot feel her foot so she does not think it was coming from  there 3/29; 2-week follow-up. Her wound measures smaller. Surface of the wound appears reasonable. She is using silver alginate with underlying Medihoney. She has home health. X-rays I did of her tib-fib last time were negative although it did show arterial calcification 4/12; 2-week follow-up. Her wound measures smaller in length. Using manuka honey with silver alginate on top. She has home health. 4/26; 2-week follow-up. Her wound is smaller but still very adherent debris under illumination requiring debridement she has been using manuka honey with silver alginate. 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 Been using Hydrofera Blue. She is on suppressive Bactrim but apparently she has been taking it twice daily 6/7; I have not seen this wound and about 6 weeks. Since then she was up in [place]. By her own admission she was walking on the foot because she did not have a wheelchair. The wound is not nearly as healthy looking as it was the last time I saw this. We ordered different things for her but she only uses Medihoney and silver alginate. As far as I know she is on suppressive trimethoprim sulfamethoxazole. She does not admit to any fever or chills. Her CBGs apparently are at baseline however she is saying that she feels some discomfort on the lateral part of her ankle I looked over her last inflammatory markers from the summer 2020 at which time she had a deeply necrotic infected wound in this area. On 11/10/2018 her sedimentation rate was 56 and C-reactive protein 9.9. This was 107 and 29 on 07/29/2018. Objective Constitutional Vitals Time Taken: 3:29 PM, Height: 67 in, Weight: 125 lbs, BMI: 19.6, Temperature: 98.7 F, Pulse: 82 bpm, Respiratory Rate: 18 breaths/min, Blood Pressure: 128/78 mmHg, Capillary Blood Glucose: 130 mg/dl. Cardiovascular Her pedal pulses are  palpable. Musculoskeletal Some warmth about the right ankle but no obvious effusion no obvious erythema. Psychiatric appears at normal baseline. General Notes: Wound exam; forefoot. This is not nearly as healthy looking as it was 6 weeks ago. I removed necrotic black eschar from the superior rim of the wound some adherent necrotic debris from the mid part of the wound. Nothing probes deeply although I did culture the area in the mid part of the wound post debridement. There was no purulence however Integumentary (Hair, Skin) Wound #43 status is Open. Original cause of wound was Gradually Appeared. The wound is located on the Right,Medial Foot. The wound measures 5cm length x 3.6cm width x 0.1cm depth; 14.137cm^2 area and 1.414cm^3 volume. There is muscle and Fat Layer (Subcutaneous Tissue) Exposed exposed. There is no tunneling or undermining noted. There is a medium amount of serosanguineous drainage noted. The wound margin is flat and intact. There is small (1-33%) pink, pale granulation within the wound bed. There is a large (67-100%) amount of necrotic tissue within the wound bed including Eschar and Adherent Slough. Assessment Active Problems ICD-10 Other chronic osteomyelitis, right ankle and foot Non-pressure chronic ulcer of other part of right foot with necrosis of bone Type 1 diabetes mellitus with foot ulcer Procedures Wound #43 Pre-procedure diagnosis of Wound #43 is a Diabetic Wound/Ulcer of  the Lower Extremity located on the Right,Medial Foot .Severity of Tissue Pre Debridement is: Fat layer exposed. There was a Excisional Skin/Subcutaneous Tissue Debridement with a total area of 18 sq cm performed by [name]., MD. With the following instrument(s): Curette to remove Viable and Non-Viable tissue/material. Material removed includes Eschar, Subcutaneous Tissue, and Slough. 1 specimen was taken by a Swab and sent to the lab per facility protocol. A time out was conducted  at 16:11, prior to the start of the procedure. A Minimum amount of bleeding was controlled with Pressure. The procedure was tolerated well with a pain level of 0 throughout and a pain level of 0 following the procedure. Post Debridement Measurements: 5cm length x 3.6cm width x 0.1cm depth; 1.414cm^3 volume. Character of Wound/Ulcer Post Debridement is improved. Severity of Tissue Post Debridement is: Fat layer exposed. Post procedure Diagnosis Wound #43: Same as Pre-Procedure Plan Follow-up Appointments: Return Appointment in 1 week. Dressing Change Frequency: Wound #43 Right,Medial Foot: Change dressing three times week. Wound Cleansing: Clean wound with Wound Cleanser - Anasept to all wounds Primary Wound Dressing: Wound #43 Right,Medial Foot: Medihoney Alginate Secondary Dressing: Wound #43 Right,Medial Foot: Kerlix/Rolled Gauze Dry Gauze Edema Control: Elevate legs to the level of the heart or above for 30 minutes daily and/or when sitting, a frequency of: - throughout the day Home Health: [facility] for wound care. - Interim Laboratory ordered were: Anerobic culture - Right foot Radiology ordered were: X-ray, right foot - Non healing ulcer, pain right foot 1. The wound is certainly not as good as the last time I saw this. Debridement as noted. Tissue does not look as viable 2. I did not really see any evidence of significant arterial issues here. She does have known PAD but her peripheral pulses are palpable I do not think this is an ischemic issue 3. I am always worried about deep infection in this foot. We treated her aggressively last summer with hyperbarics antibiotics as directed by infectious disease and managed to get this wound to contract. Shortly thereafter she developed an abscess in the right leg although there is no evidence of that currently. 4. I allowed her to continue with the Medihoney and alginate that she is so fond of using.  Hydrofera Blue certainly would seem like a good dressing here although she is not compliant with it. 5. I do not think she requires an arterial evaluation at this point. I did order a plain x-ray, CBC with differential, sedimentation rate and C-reactive protein based on this she may need another MRI Electronic Signature(s) Signed: 09/07/2019 8:16:21 PM By: Nancy Ham MD Entered By: [name] on 09/07/2019 18:24:49 -------------------------------------------------------------------------------- SuperBill Details Patient Name: Date of Service: [name] 09/07/2019 Medical Record Number: [id number] Patient Account Number: [id number] Date of Birth/Sex: Treating RN: 10/21/1970 (49 y.o. [name] Primary Care Provider: Sanjuana [name], [name] Other Clinician: Referring Provider: Treating Provider/Extender: [name], [name] Weeks in Treatment: 52 Diagnosis Coding ICD-10 Codes Code Description [phone] Other chronic osteomyelitis, right ankle and foot L97.514 Non-pressure chronic ulcer of other part of right foot with necrosis of bone E10.621 Type 1 diabetes mellitus with foot ulcer Facility Procedures CPT4 Code: [id number] Description: 09323 - DEB SUBQ TISSUE 20 SQ CM/< ICD-10 Diagnosis Description L97.514 Non-pressure chronic ulcer of other part of right foot with necrosis of bone Modifier: Quantity: 1 Physician Procedures : CPT4 Code Description Modifier [id number] - WC PHYS  SUBQ TISS 20 SQ CM ICD-10 Diagnosis Description L97.514 Non-pressure chronic ulcer of other part of right foot with necrosis of bone Quantity: 1 Electronic Signature(s) Signed: 09/07/2019 8:16:21 PM By: Nancy Ham MD Entered By: [name] on 09/07/2019 18:25:07

## 2019-09-08 ENCOUNTER — Other Ambulatory Visit (HOSPITAL_COMMUNITY)
Admission: RE | Admit: 2019-09-08 | Discharge: 2019-09-08 | Disposition: A | Discharge status: Home / Self Care | Payer: Medicare HMO | Source: Other Acute Inpatient Hospital | Attending: Internal Medicine | Admitting: Internal Medicine

## 2019-09-08 DIAGNOSIS — E10621 Type 1 diabetes mellitus with foot ulcer: Secondary | ICD-10-CM | POA: Diagnosis present

## 2019-09-08 DIAGNOSIS — I87339 Chronic venous hypertension (idiopathic) with ulcer and inflammation of unspecified lower extremity: Secondary | ICD-10-CM | POA: Insufficient documentation

## 2019-09-10 LAB — AEROBIC CULTURE W GRAM STAIN (SUPERFICIAL SPECIMEN): Gram Stain: NONE SEEN

## 2019-09-12 IMAGING — MG DIGITAL DIAGNOSTIC BILATERAL MAMMOGRAM WITH TOMO AND CAD
8 series · 9 of 24 positions shown · non-contrast
Comparison: Previous exam(s).

CLINICAL DATA: 47-year-old female with diffuse bilateral breast
lumpiness.

EXAM:
DIGITAL DIAGNOSTIC BILATERAL MAMMOGRAM WITH CAD AND TOMO

[R MLO synth-2D]
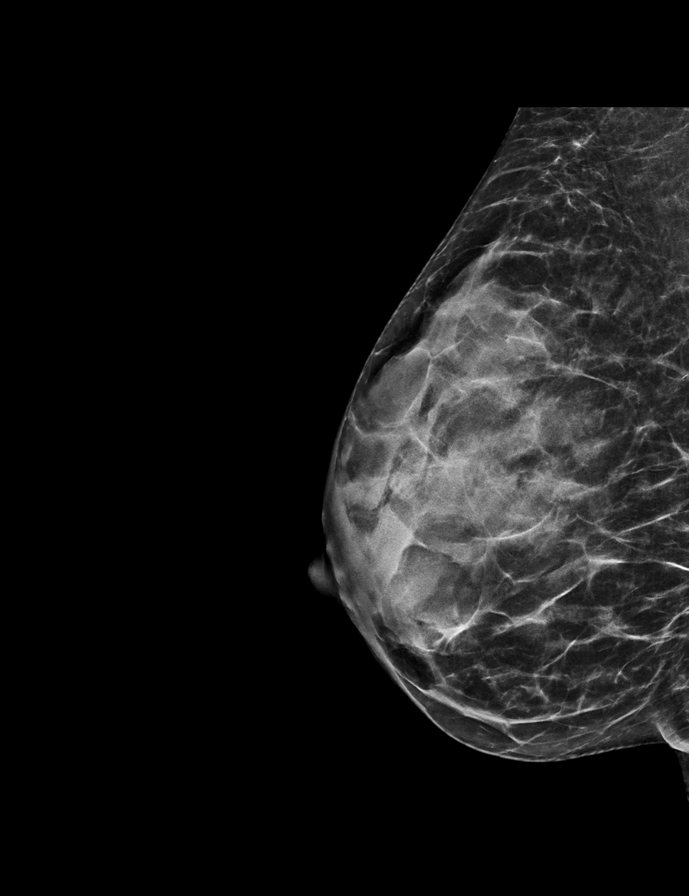

[L CC synth-2D]
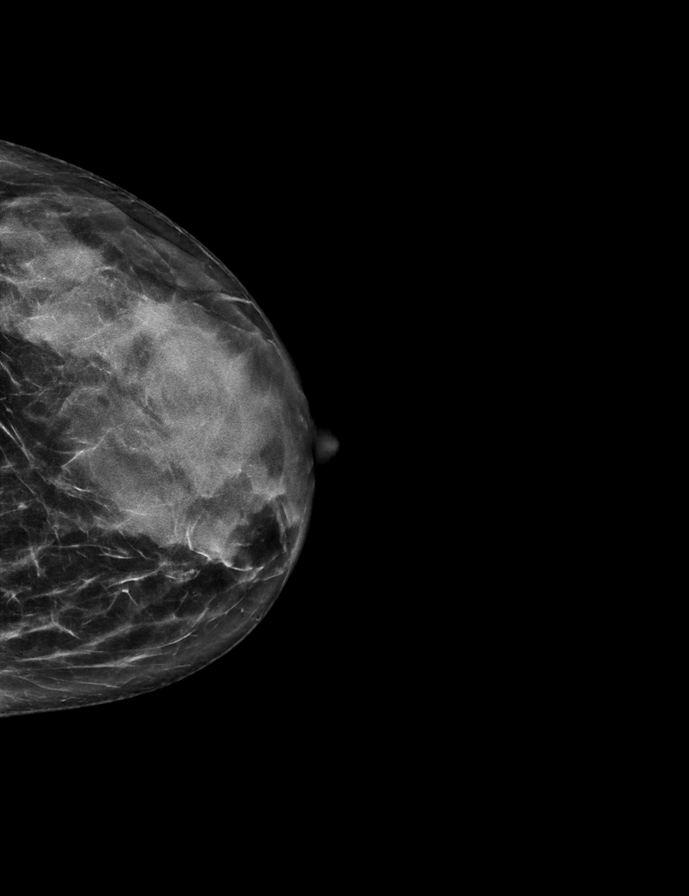

[L MLO synth-2D]
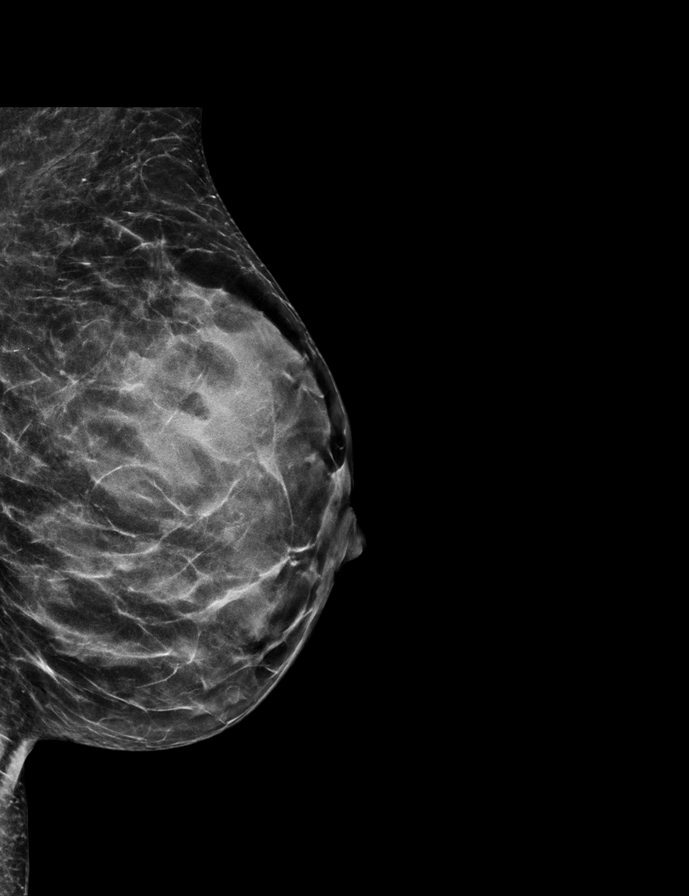

[R CC synth-2D]
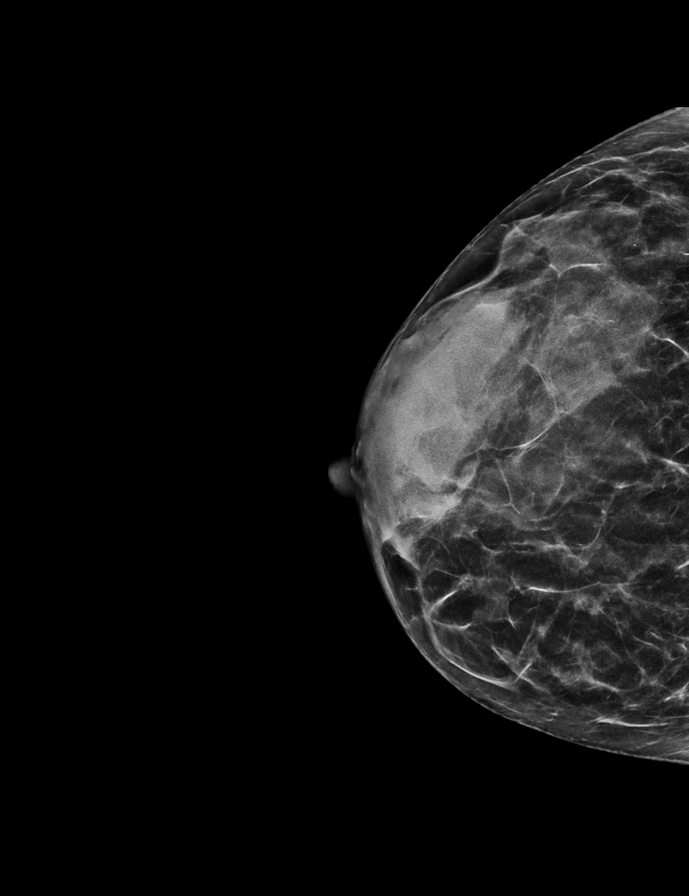

[L MLO tomo · 2 of 53 frames shown]
[frame 18/53]
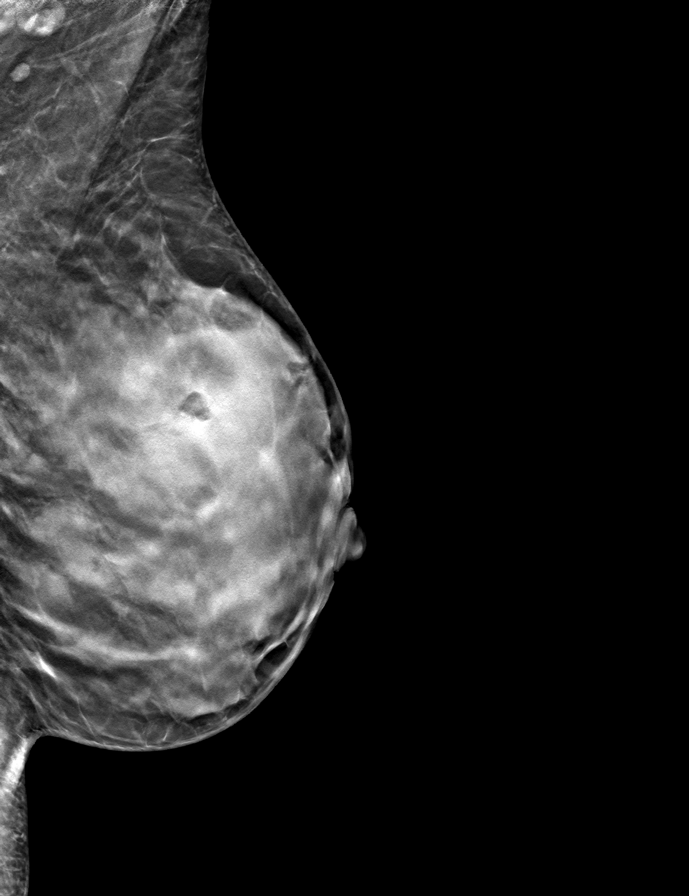
[frame 27/53]
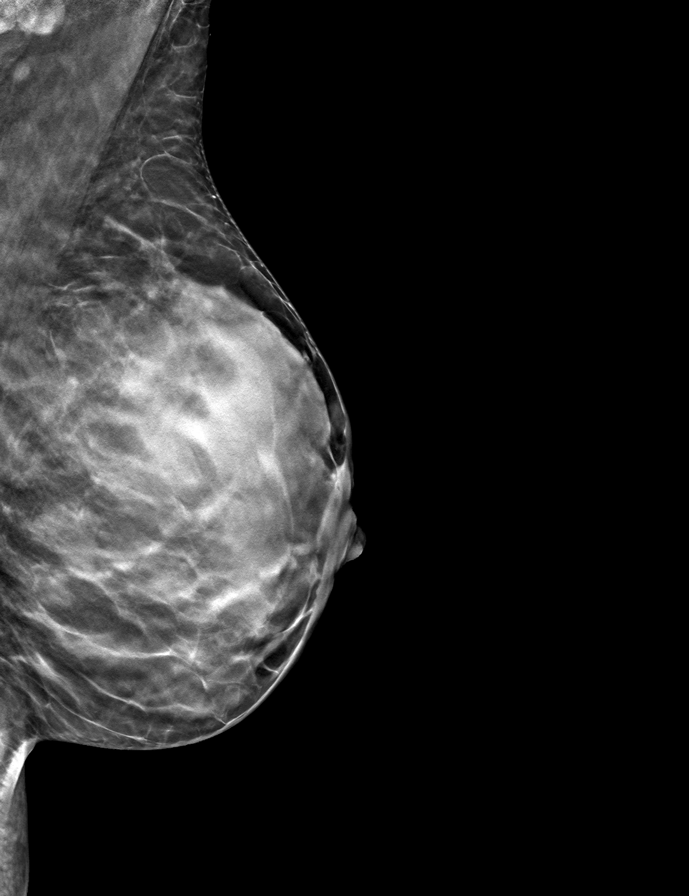

[R CC tomo · tomo slice 23/45.0]
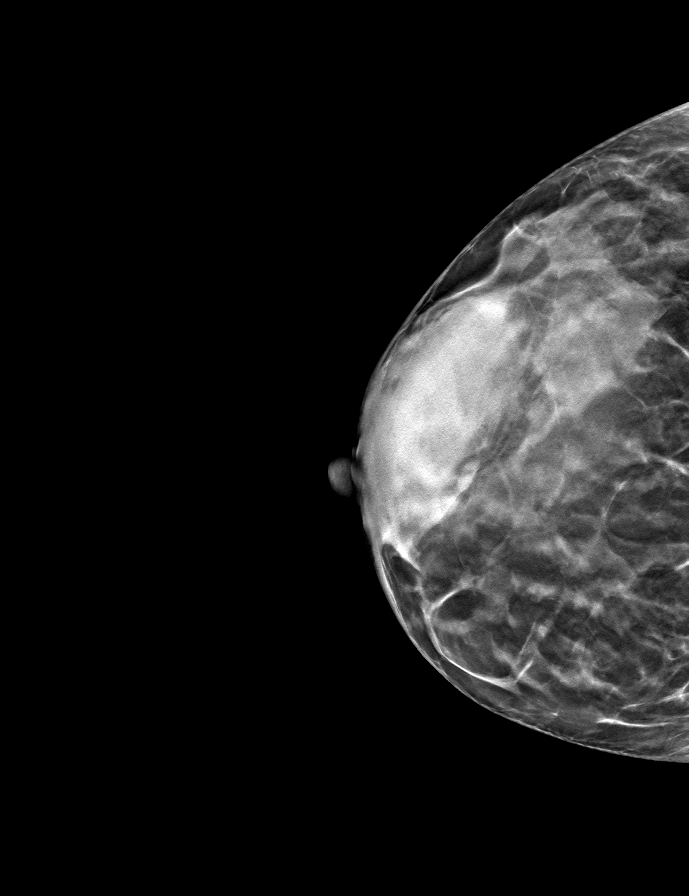

[R MLO tomo · tomo slice 25/48.0]
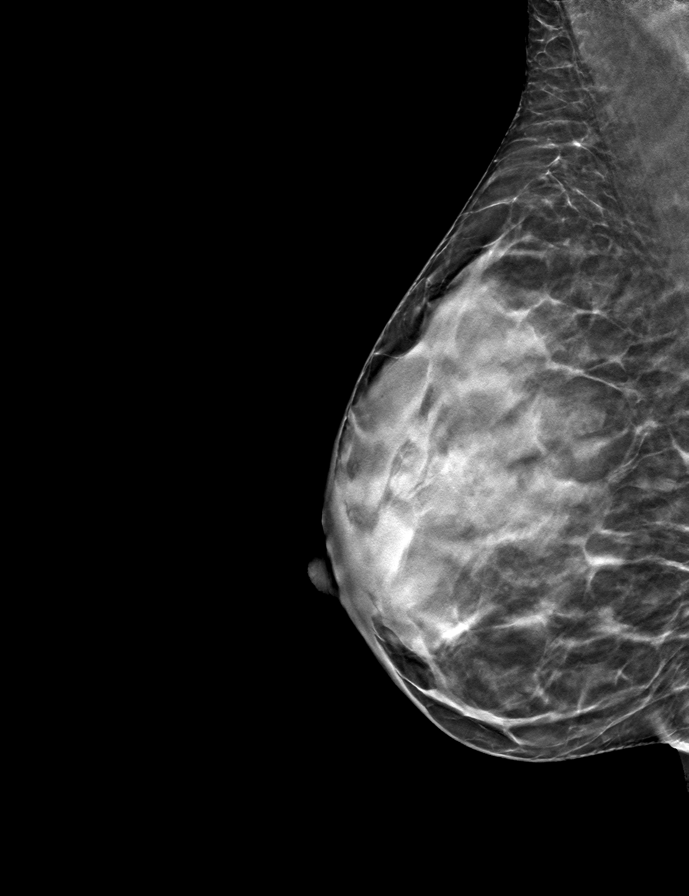

[L CC tomo · tomo slice 29/57.0]
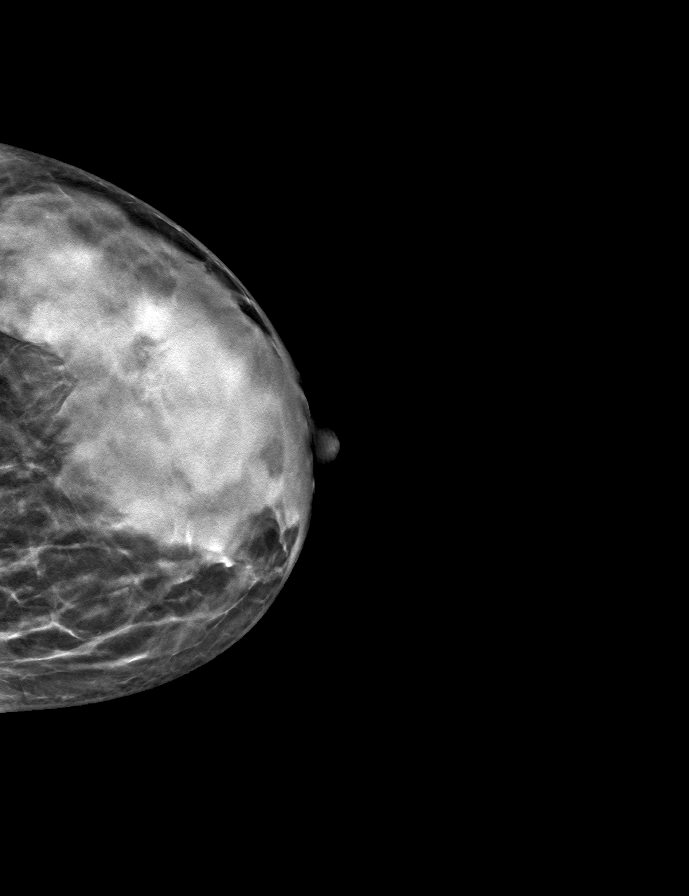

[9 of 24 positions shown; findings below may reference images not displayed]

ACR Breast Density Category d: The breast tissue is extremely dense,
which lowers the sensitivity of mammography.
FINDINGS: 2D/3D full field views of both breasts demonstrate no suspicious
mass, distortion or worrisome calcifications.

Mammographic images were processed with CAD.
IMPRESSION: No mammographic evidence of breast malignancy.

RECOMMENDATION:
Bilateral screening mammogram in 1 year

I have discussed the findings and recommendations with the patient.
Results were also provided in writing at the conclusion of the
visit. If applicable, a reminder letter will be sent to the patient
regarding the next appointment.

BI-RADS CATEGORY  1: Negative.

## 2019-09-17 ENCOUNTER — Encounter (HOSPITAL_BASED_OUTPATIENT_CLINIC_OR_DEPARTMENT_OTHER): Payer: Medicare HMO | Admitting: Internal Medicine

## 2019-09-17 ENCOUNTER — Other Ambulatory Visit: Payer: Self-pay | Admitting: Nurse Practitioner

## 2019-09-17 ENCOUNTER — Other Ambulatory Visit: Payer: Self-pay

## 2019-09-17 DIAGNOSIS — E10621 Type 1 diabetes mellitus with foot ulcer: Secondary | ICD-10-CM | POA: Diagnosis not present

## 2019-09-17 DIAGNOSIS — M79662 Pain in left lower leg: Secondary | ICD-10-CM

## 2019-09-18 ENCOUNTER — Ambulatory Visit
Admission: RE | Admit: 2019-09-18 | Discharge: 2019-09-18 | Disposition: A | Discharge status: Home / Self Care | Payer: Medicare HMO | Source: Ambulatory Visit | Attending: Nurse Practitioner | Admitting: Nurse Practitioner

## 2019-09-18 DIAGNOSIS — M79662 Pain in left lower leg: Secondary | ICD-10-CM

## 2019-09-18 NOTE — Progress Notes (Signed)
[name] ([id number]) Visit Report for 09/17/2019 HPI Details Patient Name: Date of Service: Nancy LLO [name] 09/17/2019 12:30 PM Medical Record Number: [id number] Patient Account Number: [id number] Date of Birth/Sex: Treating RN: Oct 29, 1970 (49 y.o. [name] Primary Care Provider: Sanjuana [name], [name] Other Clinician: Referring Provider: Treating Provider/Extender: [name], [name] Weeks in Treatment: 28 History of Present Illness HPI Description: 49 year old diabetic who is known to have type 1 diabetes which is poorly controlled last hemoglobin A1c was 11%. She comes in with a ulcerated area on the left lateral foot which has been there for over 6 months. Was recently she has been treated by Dr. Amalia [name] of podiatry who saw her last on 05/28/2016. Review of his notes revealed that the patient had incision and drainage with placement of antibiotic beads to the left foot on 04/11/2016 for possible osteomyelitis of the cuboid bone. Over the last year she's had a history of amputation of the left fifth toe and a femoropopliteal popliteal bypass graft somewhere in April 2017. 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 His note Dr. Amalia [name] mentions that the patient has been referred to me for further wound care and possibly great candidate for hyperbaric oxygen therapy due to recurrent osteomyelitis. However we do not have any x-rays of biopsy reports confirming this. He has been on several antibiotics including Bactrim and most recently is on doxycycline for an MRSA. I understand, the patient was not a candidate for IV antibiotics as she has had previous PICC lines which resulted in blood clots in both arms. There was a x-ray report dated 04/04/2016 on Dr. Amalia [name] notes which showed evidence of fifth ray resection left foot with osteolytic changes noted to the fourth metatarsal and cuboid bone on the left. 06/13/2016 -- had a left foot x-ray which  showed no acute fracture or dislocation and no definite radiographic evidence of osteomyelitis. Advanced osteopenia was seen. 06/20/2016 -- she has noticed a new wound on the right plantar foot in the region where she had a callus before. 06/27/16- the patient did have her x-ray of the right foot which showed no findings to suggest osteomyelitis. She saw her endocrinologist, [name], yesterday. Her A1c in January was 11. He also indicates mismanagement and noncompliance regarding her diabetes. She is currently on Bactrim for a lip infection. She is complaining of nausea, vomiting and diarrhea. She is unable to articulate the exact orders or dosing of the Bactrim; it is unclear when she will complete this. 07/04/2016 -- results from [facility] of ABIs with ankle waveforms were noted from 02/14/2016. The examination done on 06/27/2015 showed noncompressible ABIs with the right being 1.45 and the left being 1.33. The present examination showed a right ABI of 1.19 on the left of 1.33. The conclusion was that right normal ABI in the lower extremity at rest however compared to previous study which was noncompressible ABI may be falsely elevated side suggesting medial calcification. The left ABI suggested medial calcification. 08/01/2016 -- the patient had more redness and pain on her right foot and did not get to come to see as noted she see her PCP or go to the ER and decided to take some leftover metronidazole which she had at home. As usual, the patient does report she feels and is rather noncompliant. 08/08/2016 -- -- x-ray of the right foot -- FINDINGS:Transmetatarsal amputation is noted. No bony destruction is noted to suggest osteomyelitis. IMPRESSION: No evidence of  osteomyelitis. Postsurgical changes are seen. MRI would be more sensitive for possible bony changes. Culture has grown Serratia Marcescens -- sensitive to Bactrim, ciprofloxacin, ceftazidime she was seen by Dr. Daylene [name] on  08/06/2016. He did not find any exposed bone, muscle, tendon, ligament or joint. There was no malodor and he did a excisional debridement in the office. ============ Old notes: 49 year old patient who is known to the wound clinic for a while had been away from the wound clinic since 09/01/2014. Over the last several months she has been admitted to various hospitals including [facility] [place]. She was treated for a right metatarsal osteomyelitis with a transmetatarsal amputation and this was done about 2 months ago. He has a small ulcerated area on the right heel and she continues to have an ulcerated area on the left plantar aspect of the foot. The patient was recently admitted to the [facility] between 7/12 and 10/18/2014. she was given 3 weeks of IV vancomycin and was to follow-up with her surgeons at [facility] and also took oral vancomycin for C. difficile colitis. 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 MRI of the left foot done on 10/14/2014 showed no abscess or osteomyelitis. 04/27/15; this is a patient we know from previous stays in the wound care center. She is a type I diabetic I am not sure of her control currently. Since the last time I saw her she is had a right transmetatarsal amputation and has no wounds on her right foot and has no open wounds. She is been followed at the wound care center at [facility] in [place]. She comes today with the desire to undergo hyperbaric treatment locally. Apparently one of her wound care providers in [place] has suggested hyperbarics. This is in response to an MRI from 04/18/15 that showed increased marrow signal and loss of the proximal  fifth metatarsal cortex evidence of osteomyelitis with likely early osteomyelitis in the cuboid bone as well. She has a large wound over the base of the fifth metatarsal. She also has a eschar over her the tips of her toes on 1,3 and 5. She does not have peripheral pulses and apparently is going for an angiogram tomorrow which seems reasonable. After this she is going to infectious disease at [facility]. They have been using Medihoney to the large wound on the lateral aspect of the left foot to. The patient has known Charcot deformity from diabetic neuropathy. She also has known diabetic PAD. Surprisingly I can't see that she has had any recent antibiotics, the patient states the last antibiotic she had was at the end of November for 10 days. I think this was in response to culture that showed group G strep although I'm not exactly sure where the culture was from. She is also had arterial studies on 03/29/15. This showed a right ABI of 1.4 that was noncompressible. Her left ABI was 0.73. 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 It was not felt that arterial inflow was adequate for healing of a foot ulcer. Her Doppler waveforms looked monophasic ===== READMISSION 02/28/17; this is in an now 49 year old woman we've had at several different occasions in this clinic. 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 She has  a remote ex-smoker. She was last seen in this clinic by Dr. Con Memos I think in May. More recently she is been followed by her podiatrist Dr. Amalia [name] an infectious disease Dr. Megan Salon. She has 2 open wounds the major one is over the right first metatarsal head she also has a wound on the left plantar foot. an MRI of the right foot on 01/01/17 showed a soft tissue ulcer along the plantar aspect of the first metatarsal base consistent with osteomyelitis of the first metatarsal stump. Dr. 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 According to the patient this is been open for slightly over a month. She has been on a combination of Cipro 500 twice a day, Zyvox 600 twice a day and Flagyl 500 3 times a day for over a month now as directed by Dr. Megan Salon. cultures of the right foot earlier this year showed MRSA in January and Serratia in May. January also had a few viridans strep. Recent x-rays of both feet were done and Dr. Amalia [name] office and I don't have these reports. The patient has known PAD and has a history of aleft femoropopliteal bypass in April 2017. 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 Dwyane Dee. In spite of this the last hemoglobin A1c I can see is 10.1 on 01/01/2017. She is being referred by Dr. 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 She is been using Medihoney to this area and also an area on the left midfoot. She is using healing sandals bilaterally. ABIs in this clinic at the left posterior tibial was 1.1 noncompressible on the right READMISSION Non invasive vascular NOVANT 5/18 Aftercare following surgery of the circulatory system Procedure Note - Interface, External Ris In - 08/13/2016 11:05 AM EDT Procedure: Examination consists of physiologic resting arterial pressures of the brachial and ankle arteries bilaterally with continuous wave Doppler waveform analysis. Previous: Previous exam performed on 02/14/16 demonstrated ABIs of Rt = 1.19 and Lt = 1.33. Right: ABI = non-compressible PT 1.47 DP. S/P transmet amputation. , Left: ABI = 1.52, 2nd digit pressure = 87 mmHg Conclusions: Right: ABI (>1.3) may be falsely elevated, suggesting medial calcification. Left: ABI (>1.3) may be falsely  elevated, suggesting medial calcification The patient is a now 49 year old type I diabetic is had multiple issues her graded to chronic diabetic foot ulcers. 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 We had her in the clinic lastin November. At that point she had wounds on her bilateral feet.she had wanted to try hyperbarics however the healogics review process denied her because she hadn't followed up with her vascular surgeon for her left femoropopliteal bypass. The bypass was done by Dr. Raul Del at [facility]. We made her a follow-up with Dr. Raul Del however she did not keep the appointment and therefore she was not approved The patient shows me a small wound on her left fourth metatarsal head on her phone. She developed rapid discoloration in the plantar aspect of the left foot and she was admitted to hospital from 2/2 through 05/10/17 with wet gangrene of the left foot osteomyelitis of the fourth metatarsal heads. She was admitted acutely ill with a temperature of 103.  She was started on broad-spectrum vancomycin and cefepime. On 05/06/17 she was taken to the OR by Dr. Amalia [name] her podiatric surgeon for an incision and drainage irrigation of the left foot wound. Cultures from this surgery revealed group be strep and anaerobes. she was seen by Dr.Xu of orthopedic surgery and scheduled for a below-knee amputation which she u refused. Ultimately she was discharged on Levaquin and Flagyl for one month. MRI 05/05/17 done while she was in the hospital showed abscess adjacent to the fourth metatarsal head and neck small abscess around the fourth flexor tendon. Inflammatory phlegmon and gas in the soft tissues along the lateral aspect of the fourth phalanx. Findings worrisome for osteomyelitis involving the fourth proximal and middle phalanx and also the third and fourth metatarsals. Finally the patient had actually shortly  before this followed up with Dr. Raul Del at no time on 04/29/17. He felt that her left femoropopliteal bypass was patent he felt that her left-sided toe pressures more than adequate for healing a wound on the left foot. This was before her acute presentation. 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 05/28/17; she is started hyperbarics. The patient tells me that for some reason she was not actually on Levaquin but I think on ciprofloxacin. She was on Flagyl. She only started her Levaquin yesterday due to some difficulty with the pharmacy and perhaps her sister picking it up. She has an appointment with Dr. Amalia [name] tomorrow and with infectious disease early next week. She has no new complaints 06/06/17; the patient continues in hyperbarics. She saw Dr. Amalia [name] on 05/29/17 who is her podiatric surgeon. He is elected for a transmetatarsal amputation on 06/27/17. I'm not sure at what level he plans to do this amputation. The patient is unaware She also saw Dr. Megan Salon of infectious disease who elected to continue her on current antibiotics I think this is ciprofloxacin and Flagyl. I'll need to clarify with her tomorrow if she actually has this. We're using silver alginate to the actual wound. Necrotic surface today with material under the flap of her foot. Original MRI showed abscesses as well as osteomyelitis of the proximal and middle fourth phalanx and the third and fourth metatarsal heads 06/11/17; patient continues in hyperbarics and continues on oral antibiotics. She is doing well. The wound looks better. The necrotic part of this under the flap in her superior foot also looks better. she is been to see Dr. Amalia [name]. I haven't had a chance to look at his note. Apparently he has put the transmetatarsal amputation on hold her request it is still planning to take her to the OR for debridement and product application ACEL. I'll see if I can find his note. I'll therefore leave product ordering/requests to Dr. Amalia [name]  for now. I was going to look at Dermagraft 06/18/17-she is here in follow-up evaluation for bilateral foot wounds. She continues with hyperbaric therapy. She states she has been applying manuka honey to the right plantar foot and alternate manuka honey and silver alginate to the left foot, despite our orders. We will continue with same treatment plan and she will follo up next week. 06/25/17; I have reviewed Dr. Amalia [name] last note from 3/11. She has operative debridement in 2 days' time. By review his note apparently they're going to place there is skin over the majority of this wound which is a good choice. She has a small satellite area at the most proximal part of this wound on the left plantar foot. The area on the right  plantar foot we've been using silver alginate and it is close to healing. 07/02/17; unfortunately the patient was not easily approved for Dr. Amalia [name] proposed surgery. I'm not completely certain what the issue is. She has been using silver alginate to the wound she has completed a first course of hyperbarics. She is still on Levaquin and Flagyl. I have really lost track of the time course here.I suspect she should have another week to 2 of antibiotics. I'll need to see if she is followed up with infectious disease Dr. Megan Salon 07/09/17; the patient is followed up with Dr. Megan Salon. 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 She continues on Levaquin and metronidazole continuing both of these for now I think she is been on fr about 6 weeks. She still has some drainage but no pain. No fever. Her had been plans for her to go to the OR for operative debridement with her podiatrist Dr. Amalia [name], I am not exactly sure where that is. I'll probably slip a note to Dr. Amalia [name] today. I note that she follows with Dr. Dwyane Dee of endocrinology. We have her recertified for hyperbaric oxygen. I have not heard about Dermagraft however I'll see if Dr. Amalia [name] is planning a  skin substitute as well 07/16/17; the patient tells me she is just about out of [place]. I'll need to check Dr. Hale Bogus last notes on this. She states she has plenty of Flagyl however. She comes in today complaining of pain in the right lateral foot which she said lasted for about a day. The wound on the right foot is actually much more medially. She also tells me that the [facility] cost a lot of pain in the left foot wound and she turned back to silver alginate. Finally Dermagraft has a $481 per application co-pay. She cannot afford this 07/23/17; patient arrives today with the wound not much smaller. There is not much new to add. She has not heard from Dr. Amalia [name] all try to put in a call to them today. She was asking about Dermagraft again and she has an over $856 per application co-pay she states that she would be willing to try to do a payment plan. I been tried to avoid this. We've been using silver alginate, I'll change to [facility] 07/30/17-She is here in follow-up evaluation for left foot ulcer. She continues hyperbaric medicine. The left foot ulcer is stable we will continue with same treatment plan 08/06/17; she is here for evaluation of her left foot ulcer. Currently being treated for hyperbarics or underlying osteomyelitis. She is completed antibiotics. The left foot ulcer is better smaller with healthier looking granulation. For various reasons I am not really clear on we never got her back to the OR with Dr. Amalia [name]. He did not respond to my secure text message. Nevertheless I think that surgery on this point is not necessary nor am I completely clear that a skin substitute is necessary The patient is complaining about pain on the outside of her right foot. 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 There is no erythema. She also states the foot is warm versus her other part of her upper leg and this is largely true. It is not totally clear to me what's causing this. She  thinks it's different from her usual neuropathy pain 08/13/17; she arrives in clinic today with a small wound which is superficial on her right first metatarsal head. 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 She tells [place] she was up on her feet over the Mother's Day celebration. The large wound is on the left foot. Continues with hyperbarics for underlying osteomyelitis. We're using Hydrofera Blue. She asked me today about where we were with Dermagraft. I had actually excluded this because of the co-pay however she wants to assume this therefore I'll recheck the co-pay an order for next week. 08/20/17; the patient agreed to accept the co-pay of the first Apligraf which we applied today. She is disappointed she is finishing hyperbarics will run this through the insurance on the extent of the foot infection and the extent of the wound that she had however she is already had 60 dive's. Dermagraft No. 1 08/27/17; Dermagraft No. 2. She is not eligible for any more hyperbaric treatments this month. She reports a fair amount of drainage and she actually changed to the external dressings without disturbing the direct contact layer 09/03/17; the patient arrived in clinic today with the wound superficially looking quite healthy. Nice vibrant red tissue with some advancing epithelialization although not as much adherence of the flap as I might like. However she noted on her own fourth toe some bogginess and she brought that to our attention. Indeed this was boggy feeling like a possibility of subcutaneous fluid. 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 She is not been unwell. We've been using Dermagraft 09/10/17; the culture that I did not last week was MRSA. She saw Dr. Megan Salon this morning who is going to start her on vancomycin. I had sent him a secure a text message yesterday. I also spoke with her podiatric surgeon  Dr. Amalia [name] about surgery on this foot the options for conserving a functional foot etc. Promised me he would see her and will make back consultation today. Paradoxically her actual wound on the plantar aspect of her left foot looks really quite good. I had given her 5 days worth of Baxdella to cover her for MRSA. Her MRI came back showing osteomyelitis within the third metatarsal shaft and head and base of the third and fourth proximal phalanx. She had extensive inflammatory changes throughout the soft tissue of the lateral forefoot. With an ill-defined fluid around the fourth metatarsal extending into the plantar and dorsal soft tissues 09/19/17; the patient is actually on oral Septra and Flagyl. She apparently refused IV vancomycin. She also saw Dr. Amalia [name] at my request who is planning her for a left BKA sometime in mid July. MRI showed osteomyelitis within the third metatarsal shaft and head and the basis of the third and fourth proximal phalanx. I believe there was felt to be possible septic arthritis involving the third MTP. 09/26/17; the patient went back to Dr. Megan Salon at my suggestion and is now receiving IV daptomycin. Her wound continues to look quite good making the decision to proceed with a transmetatarsal amputation although more difficult for the patient. I believe in my extensive discussions with her she has a good sense of the pros and cons of this. I don't NV the tuft decision she has to make. She has an appointment with Dr. Amalia [name] I believe in mid July and I previously spoken to him about this issue Has we had used 3 previous Dermagraft. Given the condition of the wound surface I went ahead and added the fourth one today area and I did this not fully realizing that she'll be traveling  to [place] next week. I'm hopeful she can come back in 2 weeks 10/21/17; Her same Dermagraft on for about 3-1/2 weeks. In spite of this the wound arrives looking quite healthy. There is been a lot of  healing dimensions are smaller. Looking at the square shaped wound she has now there is some undermining and some depth medially under the undermining although I cannot palpate any bone. No surrounding infection is obvious. She has difficult questions about how to look at this going forward vis--vis amputations versus continued medical therapy. T be truthful the wound is looks o so healthy and it is continued to contract. Hard to justify foot surgery at this point although I still told her that I think it might come to that if we are not able to eradicate the underlying MRSA. She is still highly at risk and she understands this 11/06/17 on evaluation today patient appears to be doing better in regard to her foot ulcer. She's been tolerating the dressing changes without complication. Currently she is here for her Dermagraft #6. Her wound continues to make excellent progress at this point. She does not appear to have any evidence of infection which is good news. 11/13/17 on evaluation today patient appears to be doing excellent at this time. She is here for repeat Dermagraft application. This is #7. Overall her wound seems to be making great progress. 12/05/17; the patient arrives with the wound in much better condition than when I last saw this almost 6 weeks ago. She still has a small probing area in the left metatarsal head region on the lateral aspect of her foot. We applied her last Dermagraft today. Since the last time she is here she has what appears to a been a blood blister on the plantar aspect of left foot although I don't see this is threatening. There is also a thick raised tissue on the right mid metatarsal head region. This was not there I don't think the last time she was here 3 weeks ago. 12/12/17; the patient continues to have a small programming area in the left metatarsal head region on the lateral aspect of her foot which was the initial large surgical wound. I applied her last Apligraf  last week. I'm going to use Endoform starting today Unfortunately she has an excoriated area in the left mid foot and the right mid foot. The left midfoot looks like a blistered area this was not opened last week it certainly is open today. Using silver alginate on these areas. She promises me she is offloading this. 12/19/17; the small probing area in the left metatarsal head eyes think is shallower. In general her original wound looks better. We've been using Endoform. The area inferiorly that I think was trauma last week still requires debridement a lot of nonviable surface which I removed. She still has an open open area distally in her foot Similarly on the right foot there is tightly adherent surface debris which I removed. Still areas that don't look completely epithelialized. This is a small open area. We used silver alginate on these areas 12/26/2017; the patient did not have the supplies we ordered from last week including the Endoform. The original large wound on the left lateral foot looks healthy. She still has the undermining area that is largely unchanged from last week. She has the same heavily callused raised edged wounds on the right mid and left midfoot. Both of these requiring debridement. We have been using silver alginate on these areas 01/02/2018;  there is still supply issues. We are going to try to use Prisma but I am not sure she actually got it from what she is saying. She has a new open area on the lateral aspect of the left fourth toe [previous fifth ray amputation]. Still the one tunneling area over the fourth metatarsal head. The area is in the midfoot bilaterally still have thick callus around them. She is concerned about a raised swelling on the lateral aspect of the foot. However she is completely insensate 01/10/2018; we are using Prisma to the wounds on her bilateral feet. Surprisingly the tunneling area over the left fourth metatarsal head that was part of  her original surgery has closed down. She has a small open area remaining on the incision line. 2 open areas in the midfoot. 02/10/2018; the patient arrives back in clinic after a month hiatus. She was traveling to visit family in [place]. Is fairly clear she was not offloading the areas on her feet. The original wound over the left lateral foot at the level of metatarsal heads is reopened and probes medially by about a centimeter or 2. She notes that a week ago she had purulent drainage come out of an area on the left midfoot. Paradoxically the worst area is actually on the right foot is extensive with purulent drainage. We will use silver alginate today 02/17/2018; the patient has 3 wounds one over the left lateral foot. She still has a small area over the metatarsal heads which is the remnant of her original surgical wound. This has medial probing depth of roughly 1.4 cm somewhat better than last week. The area on the right foot is larger. We have been using silver alginate to all areas. The area on the right foot and left foot that we cultured last week showed both Klebsiella and Proteus. Both of these are quinolone sensitive. The patient put her's self on Bactrim and Flagyl that she had left hanging around from prior antibiotic usages. She was apparently on this last week when she arrived. I did not realize this. Unfortunately the Bactrim will not cover either 1 of these organisms. We will send in Cipro 500 twice daily for a week 03/04/2018; the patient has 2 wounds on the left foot one is the original wound which was a surgical wound for a deep DFU. At one point this had exposed bone. She still has an area over the fourth metatarsal head that probes about 1.4 cm although I think this is better than last week. I been using silver nitrate to try and promote tissue adherence and been using silver alginate here. She also has an area in the left midfoot. This has some depth but a small linear wound.  Still requiring debridement. On the right midfoot is a circular wound. A lot of thick callus around this area. We have been using silver alginate to all wound areas She is completed the ciprofloxacin I gave her 2 weeks ago. 03/11/2018; the patient continues to have 2 open areas on the left foot 1 of which was the original surgical wound for a deep DFU. Only a small probing area remains although this is not much different from last week we have been using silver alginate. The other area is on the midfoot this is smaller linear but still with some depth. We have been using silver alginate here as well On the right foot she has a small circular wound in the mid aspect. This is not much smaller than last  time. We have been using silver alginate here as well 03/18/2018; she has 3 wounds on the left foot the original surgical wound, a very superficial wound in the mid aspect and then finally the area in the mid plantar foot. She arrives in today with a very concerning area in the wound in the mid plantar foot which is her most proximal wound. There is undermining here of roughly 1-1/2 cm superiorly. Serosanguineous drainage. She tells me she had some pain on for over the weekend that shot up her foot into her thigh and she tells me that she had a nodule in the groin area. She has the single wound in the right foot. We are using endoform to both wound areas 03/24/2018; the patient arrives with the original surgical wound in the area on the left midfoot about the same as last week. There is a collection of fluid under the surface of the skin extending from the surgical wound towards the midfoot although it does not reach the midfoot wound. The area on the right foot is about the same. Cultures from last week of the left midfoot wound showed abundant Klebsiella abundant Enterococcus faecalis and moderate methicillin resistant staph I gave her Levaquin but this would have only covered the Klebsiella. She  will need linezolid 04/01/2018; she is taking linezolid but for the first few days only took 1 a day. I have advised her to finish this at twice daily dosing. In any case all of her wounds are a lot better especially on the left foot. The original surgical wound is closed. The area on the left midfoot considerably smaller. The area on the right foot also smaller. 04/08/2018; her original surgical wound/osteomyelitis on the left foot remains closed. She has area on the left foot that is in the midfoot area but she had some streaking towards this. This is not connected with her original wound at least not visually. Small wound on the right midfoot appears somewhat smaller. 04/15/18; both wounds looks better. Original wound is better left midfoot. Using silver alginate 1/21; patient states she uses saltwater soak in, stones or remove callus from around her wounds. She is also concerned about a blood blister she had on the left foot but it simply resolved on its own. We've been using silver alginate 1/28; the patient arrives today with the same streaking area from her metatarsals laterally [the site of her original surgical wound] down to the middle of her foot. There is some drainage in the subcutaneous area here. This concerns me that there is actually continued ongoing infection in the metatarsals probably the fourth and third. This fixates an MRI of the foot without contrast [chronic renal failure] The wound in the mid part of the foot is small but I wonder whether this area actually connects with the more distal foot. The area on the right midfoot is probably about the same. Callus thick skin around the small wound which I removed with a curette we have been using silver alginate on both wound areas 2/4; culture I did of the draining site on the left foot last time grew methicillin sensitive staph aureus. MRI of the left foot showed interval resolution of the findings surrounding the third metatarsal  joint on the prior study consistent with treated osteomyelitis. Chronic soft tissue ulceration in the plantar and lateral aspect of the forefoot without residual focal fluid collection. No evidence of recurrent osteomyelitis. Noted to have the previous amputation of the distal first phalanx and fifth  ray MRI of the right foot showed no evidence of osteomyelitis I am going to treat the patient with a prolonged course of antibiotics directed against MSSA in the left foot 2/11; patient continues on cephalexin. She tells me she had nausea and vomiting over the weekend and missed 2 days. In general her foot looks much the same. She has a small open area just below the left fourth metatarsal head. A linear area in the left midfoot. Some discoloration extending from the inferior part of this into the left lateral foot although this appears to be superficial. She has a small area on the right midfoot which generally looks smaller after debridement 2/18; the patient is completing his cephalexin and has another 2 days. She continues to have open areas on the left and right foot. 2/25; she is now off antibiotics. The area on the left foot at the site of her original surgical wound has closed yet again. She still has open areas in the mid part of her foot however these appear smaller. The area on the right mid foot looks about the same. We have been using silver alginate She tells me she had a serious hypoglycemic spell at home. She had to have EMS called and get IV dextrose 3/3; disappointing on the left lateral foot large area of necrotic tissue surrounding the linear area. This appears to track up towards the same original surgical wound. Required extensive debridement. The area on the right plantar foot is not a lot better also using silver 3/12; the culture I did last time showed abundant enterococcus. I have prescribed Augmentin, should cover any unrecognized anaerobes as well. In addition there were a few  MRSA and Serratia that would not be well covered although I did not want to give her multiple antibiotics. She comes in today with a new wound in the right midfoot this is not connected with the original wound over her MTP a lot of thick callus tissue around both wounds but once again she said she is not walking on these areas 3/17-Patient comes in for follow-up on the bilateral plantar wounds, the right midfoot and the left plantar wound. Both these are heavily callused surrounding the wounds. We are continuing to use silver alginate, she is compliant with offloading and states she uses a wheelchair fairly often at home 3/24; both wound areas have thick callus. However things actually look quite a bit better here for the majority of her left foot and the right foot. 3/31; patient continues to have thick callused somewhat irritated looking tissue around the wounds which individually are fairly superficial. There is no evidence of surrounding infection. We have been using silver alginate however I change that to [facility] today 4/17; patient returns to clinic after having a scare with Covid she tested negative in her primary doctor's office. She has been using Hydrofera Blue. She does not have an open area on the right foot. On the left foot she has a small open area with the mid area not completely viable. She showed me pictures of what looks like a hemorrhagic blister from several days ago but that seems to have healed over this was on the lateral left foot 4/21; patient comes in to clinic with both her wounds on her feet closed. However over the weekend she started having pain in her right foot and leg up into the thigh. She felt as though she was running a low-grade fever but did not take her temperature. She took a  doxycycline that she had leftover and yesterday a single Septra and metronidazole. She thinks things feel somewhat better. 4/28; duplex ultrasound I ordered last week was negative  for DVT or superficial thrombophlebitis. She is completed the doxycycline I gave her. States she is still having a lot of pain in the right calf and right ankle which is no better than last week. She cannot sleep. She also states she has a temperature of up to 101, coughing and complaining of visual loss in her bilateral eyes. Apparently she was tested for Covid 2 weeks ago at [facility] and that was negative. Readmission: 09/03/18 patient presents back for reevaluation after having been evaluated at the end of April regarding erythema and swelling of her right lower extremity. Subsequently she ended up going to the hospital on 07/29/18 and was admitted not to be discharged until 08/08/18. Unfortunately it was noted during the time that she was in the hospital that she did have methicillin-resistant Staphylococcus aureus as the infection noted at the site. It was also determined that she did have osteomyelitis which appears to be fairly significant. She was treated with vancomycin and in fact is still on IV vancomycin at dialysis currently. This is actually slated to continue until 09/12/18 at least which will be the completion of the six weeks of therapy. Nonetheless based on what I'm seeing at this point I'm not sure she will be anywhere near ready to discontinue antibiotics at that time. Since she was released from the hospital she was seen by Dr. Amalia [name] who is her podiatrist on 08/27/18. His note specifically states that he is recommended that the patient needs of one knee amputation on the right as she has a life- threatening situation that can lead quickly to sepsis. The patient advised she would like to try to save her leg to which Dr. Amalia [name] apparently told her that this was against all medical advice. She also want to discontinue the Wound VAC which had been initiated due to the fact that she wasn't pleased with how the wound was looking and subsequently she wanted to pursue applying Medihoney at that time.  He stated that he did not believe that the right lower extremity was salvageable and that the patient understood but would still like to attempt hyperbaric option therapy if it could be of any benefit. She was therefore referred back to [place] for further evaluation. He plans to see her back next week. Upon inspection today patient has a significant amount purulent drainage noted from the wound at this point. The bone in the distal portion of her foot also appears to be extremely necrotic and spongy. When I push down on the bone it bubbles and seeps purulent drainage from deeper in the end of the foot. I do not think that this is likely going to heal very well at all and less aggressive surgical debridement were undertaken more than what I believe we can likely do here in our office. 09/12/2018; I have not seen this patient since the most recent hospitalization although she was in our clinic last week. I have reviewed some of her records from a complex hospitalization. 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 There is situation was complicated by MRSA bacteremia and acute on chronic renal failure now on dialysis. She is receiving vancomycin at dialysis. We started her on Dakin's wet-to-dry last week she is changing this daily. There is still purulent drainage coming out of her foot.  Although she is apparently "agreeable" to a below-knee amputation which is been suggested by multiple clinicians she wants this to be done in [place]. She apparently has a telehealth visit with that provider sometime in late Tye 6/24. I have told her I think this is probably too long. Nevertheless I could not convince her to allow a local doctor to perform BKA. 09/19/2018; the patient has a large necrotic area on the right anterior foot. She has had previous transmetatarsal amputations. Culture I did last week showed MRSA nothing else she is on vancomycin at dialysis. She  has continued leaking purulent drainage out of the distal part of the large circular wound on the right anterior foot. She apparently went to see Dr. 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 Somehow that translated into her being referred to plastic surgery for debridement of the area. I gather she basically refused amputation although I do not have a copy of Dr. Berenice Primas notes. The patient really wants to have a trial of hyperbaric oxygen. I agreed with initial assessment in this clinic that this was probably too far along to benefit however if she is going to have plastic surgery I think she would benefit from ancillary hyperbaric oxygen. The issue here is that the patient has benefited as maximally as any patient I have ever seen from hyperbaric oxygen therapy. Most recently she had exposed bone on the lateral part of her left foot after a surgical procedure and that actually has closed. She has eschared areas in both heels but no open area. She is remained systemically well. I am not optimistic that anything can be done about this but the patient is very clear that she wants an attempt. The attempt would include a wound VAC further debridements and hyperbaric oxygen along with IV antibiotics. 6/26; I put her in for a trial of hyperbaric oxygen only because of the dramatic response she has had with wounds on her left midfoot earlier this year which was a surgical wound that went straight to her bone over the metatarsal heads and also remotely the left third toe. We will see if we can get this through our review process and insurance. She arrives in clinic with again purulent material pouring out of necrotic bone on the top of the foot distally. There is also some concerning erythema on the front of the leg that we marked. It is bit difficult to tell how tender this is because of neuropathy. I note from infectious disease that she had her vancomycin extended. All the  cultures of these areas have shown MRSA sensitive to vancomycin. She had the wound VAC on for part of the week. The rest of the time she is putting various things on this including Medihoney, "ionized water" silver sorb gel etc. 7/7; follow-up along with HBO. She is still on vancomycin at dialysis. She has a large open area on the dorsal right foot and a small dark eschar area on her heel. There is a lot less erythema in the area and a lot less tenderness. From an infection point of view I think this is better. She still has a lot of necrosis in the remaining right forefoot [previous TMA] we are still using the wound VAC in this area 7/16; follow-up along with HBO. I put her on linezolid after she finished her vancomycin. We started this last Friday I gave her 2 weeks worth. I had the expectation that she would be operatively debrided by Dr. Marla Roe  but that still has not happened yet. Patient phoned the office this week. She arrives for review today after HBO. The distal part of this wound is completely necrotic. Nonviable pieces of tendon bone was still purulent drainage. Also concerning that she has black eschar over the heel that is expanding. I think this may be indicative of infection in this area as well. She has less erythema and warmth in the ankle and calf but still an abnormal exam 7/21 follow-up along with HBO. I will renew her linezolid after checking a CBC with differential monitoring her blood counts especially her platelets. She was supposed to have surgery yesterday but if I am reading things correctly this was canceled after her blood sugar was found to be over 500. I thought Dr. Marla Roe who called me said that they were sending her to the ER but the patient states that was not the case. 7/28. Follow-up along with HBO. She is on linezolid I still do not have any lab work from dialysis even though I called last week. The patient is concerned about an area on her left lateral  foot about the level of the base of her fifth metatarsal. I did not really see anything that ominous here however this patient is in [place] ability to point out problems that she is sensing and she has been accurate in the past Finally she received a call from Dr. Marla Roe who is referring her to another orthopedic surgeon stating that she is too booked up to take her to the operating room now. Was still using a wound VAC on the foot 8/3 -Follow-up after HBO, she is got another week of linezolid, she is to call ID for an appointment, x-rays of both feet were reviewed, the left foot x-ray with third MTP joint osteo- Right foot x-ray widespread osteo-in the right midfoot Right ankle x-ray does not show any active evidence of infection 8/11-Patient is seen after HBO, the wounds on the right foot appear to be about the same, the heel wound had some necrotic base over tendon that was debrided with a curette 8/21; patient is seen after HBO. The patient's wound on her dorsal foot actually looks reasonably good and there is substantial amount of epithelialization however the open area distally still has a lot of necrotic debris partially bone. I cannot really get a good sense of just how deep this probes under the foot. She has been pressuring me this week to order medical maggots through a company in [place] for her. The problem I have is there is not a defined wound area here. On the positive side there is no purulence. She has been to see infectious disease she is still on Septra DS although I have not had a chance to review their notes 8/28; patient is seen in conjunction with HBO. The wounds on her foot continued to improve including the right dorsal foot substantially the, the distal part of this wound and the area on the right heel. We have been using a wound VAC over this chronically. She is still on trimethoprim as directed by infectious disease 9/4; patient is seen in conjunction with HBO.  Right dorsal foot wound substantially anteriorly is better however she continues to have a deep wound in the distal part of this that is not responding. We have been using silver collagen under border foam Area on the right plantar medial heel seems better. We have been using Hydrofera Blue 12/12/18 on evaluation today patient appears to be  doing about the same with regard to her wound based on prior measurements. She does have some necrotic tissue noted on the lateral aspect of the wound that is going require a little bit of sharp debridement today. This includes what appears to be potentially either severely necrotic bone or tendon. Nonetheless other than that she does not appear to have any severe infection which is good news 9/18; it is been 2 weeks since I saw this wound. She is tolerating HBO well. Continued dramatic improvement in the area on the right dorsal foot. She still has a small wound on the heel that we have been using Hydrofera Blue. She continues with a wound VAC 9/24; patient has to be seen emergently today with a swelling on her right lateral lower leg. She says that she told Dr. Evette Doffing about this and also myself on a couple of occasions but I really have no recollection of this. She is not systemically unwell and her wound really looked good the last time I saw this. She showed this to providers at dialysis and she was able to verify that she was started on cephalexin today for 5 doses at dialysis. She dialyzes on Tuesday Thursday and Saturday. 10/2; patient is seen in conjunction with HBO. The area that is draining on the right anterior medial tibia is more extensive. Copious amounts of serosanguineous drainage with some purulence. We are still using the wound VAC on the original wound then it is stable. Culture I did of the original IandD showed MRSA I contacted dialysis she is now on vancomycin with dialysis treatments. I asked them to run a month 10/9; patient seen in  conjunction with HBO. She had a new spontaneous open area just above the wound on the right medial tibia ankle. More swelling on the right medial tibia. Her wound on the foot looks about the same perhaps slightly better. There is no warmth spreading up her leg but no obvious erythema. her MRI of the foot and ankle and distal tib-fib is not booked for next Friday I discussed this with her in great detail over multiple days. it is likely she has spreading infection upper leg at least involving the distal 25% above the ankle. She knows that if I refer her to orthopedics for infectious disease they are going to recommend amputation and indeed I am not against this myself. We had a good trial at trying to heal the foot which is what she wanted along with antibiotics debridement and HBO however she clearly has spreading infection [probably staph aureus/MRSA]. Nevertheless she once again tells me she wants to wait the left of the MRI. She still makes comments about having her amputation done in [place]. 10/19; arrives today with significant swelling on the lateral right leg. Last culture I did showed Klebsiella. Multidrug-resistant. Cipro was intermediate sensitivity and that is what I have her on pending her MRI which apparently is going to be done on Thursday this week although this seems to be moving back and forth. She is not systemically unwell. We are using silver alginate on her major wound area on the right medial foot and the draining areas on the right lateral lower leg 10/26; MRI showed extensive abscess in the anterior compartment of the right leg also widespread osteomyelitis involving osseous structures of the midfoot and portions of the hindfoot. Also suspicion for osteomyelitis anterior aspect of the distal medial malleolus. Culture I did of the purulence once again showed a multidrug-resistant Klebsiella. I have been  in contact with nephrology late last week and she has been started on  cefepime at dialysis to replace the vancomycin We sent a copy of her MRI report to Dr. Geroge Baseman in [place] who is an orthopedic surgeon. The patient takes great stock in his opinion on this. She says she will go to [place] to have her leg amputated if Dr. Geroge Baseman does not feel there is any salvage options. 11/2; she still is not talk to her orthopedic surgeon in [place]. Apparently he will call her at [phone] this afternoon. The quality of this is she has not allowed me to refer her anywhere. She has been told over and over that she needs this amputated but has not agreed to be referred. She tells me her blood sugar was 600 last night but she has not been febrile. 11/9; she never did got a call from the orthopedic surgeon in [place] therefore that is off the radar. We have arranged to get her see orthopedic surgery at [facility]. She still has a lot of draining purulence coming out of the new abscess in her right leg although that probably came from the osteomyelitis in her right foot and heel. Meanwhile the original wound on the right foot looks very healthy. Continued improvement. The issue is that the last MRI showed osteomyelitis in her right foot extensively she now has an abscess in the right anterior lower leg. There is nobody in [place] who will offer this woman anything but an amputation and to be honest that is probably what she needs. I think she still wants to talk about limb salvage although at this point I just do not see that. She has completed her vancomycin at dialysis which was for the original staph aureus she is still on cefepime for the more recent Klebsiella. She has had a long course of both of these antibiotics which should have benefited the osteomyelitis on the right foot as well as the abscess. 11/16; apparently [place] elective surgery is shut down because of COVID-19 pandemic. I have reached out to some contacts at [facility] to see if we can get her an  orthopedic appointment there. I am concerned about continually leaving this but for the moment everything is static. In fact her original large wound on this foot is closing down. It is the abscess on the right anterior leg that continues to drain purulent serosanguineous material. She is not currently on any antibiotics however she had a prolonged course of vancomycin [1 month] as well as cefepime for a month 02/24/2019 on evaluation today patient appears to be doing better than the last time I saw her. This is not a patient that I typically see. With that being said I am covering for Dr. Dellia Nims this week and again compared to when I last saw her overall the wounds in particular seem to be doing significantly better which is good news. With that being said the patient tells me several disconcerting things. She has not been able to get in to see anyone for potential debridement in regard to her leg wounds although she tells me that she does not think it is necessary any longer because she is taking care of that herself. She noticed a string coming out of the lower wound on her leg over the last week. The patient states that she subsequently decided that we must of pack something in there and started pulling the string out and as it kept coming and coming she realized this was likely her  tendon. With that being said she continued to remove as much of this as she could. She then I subsequently proceeded to using tubes of antibiotic ointment which she will stick down into the wound and then scored as much as she can until she sees it coming out of the other wound opening. She states that in doing this she is actually made things better and there is less redness and irritation. With regard to her foot wound she does have some necrotic tendon and tissue noted in one small corner but again the actual wound itself seems to be doing better with good granulation in general compared to my last evaluation. 12/7;  continued improvement in the wound on the substantial part of the right medial foot. Still a necrotic area inferiorly that required debridement but the rest of this looks very healthy and is contracting. She has 2 wounds on the right lateral leg which were her original drainage sites from her abscess but all of this looks a lot better as well. She has been using silver alginate after putting antibiotic biotic ointment in one wound and watching it come out the other. I have talked to her in some detail today. I had given her names of orthopedic surgeons at [facility] for second opinion on what to do about the right leg. I do not think the patient never called them. She has not been able to get a hold of the orthopedic surgeon in [place] that she had put a lot of faith in as being somebody would give her an opinion that she would trust. I talked to her today and said even if I could get her in to another orthopedic surgeon about the leg which she accept an amputation and she said she would not therefore I am not going to press this issue for the moment 12/14; continued improvement in his substantial wound on the right medial foot. There is still a necrotic area inferiorly with tightly adherent necrotic debris which I have been working on debriding each time she is here. She does not have an orthopedic appointment. Since last time she was here I looked over her cultures which were essentially MRSA on the foot wound and gram-negative rods in the abscess on the anterior leg. 12/21; continued improvement in the area on the right medial foot. She is not up on this much and that is probably a good thing since I do not know it could support continuous ambulation. She has a small area on the right lateral leg which were remanence of the IandD's I did because of the abscess. I think she should probably have prophylactic antibiotics I am going to have to look this over to see if we can make an intelligent  decision here. In the meantime her major wound is come down nicely. Necrotic area inferiorly is still there but looks a lot better 04/06/2019; she has had some improvement in the overall surface area on the right medial foot somewhat narrowedr both but somewhat longer. The areas on the right lateral leg which were initial IandD sites are superficial. Nothing is present on the right heel. We are using silver alginate to the wound areas 1/18; right medial foot somewhat smaller. Still a deep probing area in the most distal recess of the wound. She has nothing open on the right leg. She has a new wound on the plantar aspect of her left fourth toe which may have come from just pulling skin. The patient using Medihoney on  the wound on her foot under silver alginate. I cannot discourage her from this 2/1; 2-week follow-up using silver alginate on the right foot and her left fourth toe. The area on the right dorsal foot is contracted although there is still the deep area in the most distal part of the wound but still has some probing depth. No overt infection 2/15; 2-week follow-up. She continues to have improvement in the surface area on the dorsal right foot. Even the tunneling area from last time is almost closed. The area that was on the plantar part of her left fourth toe over the PIP is indeed closed 3/1; 2-week follow-up. Continued improvement in surface area. The original divot that we have been debriding inferiorly I think has full epithelialization although the epithelialization is gone down into the wound with probably 4 mm of depth. Even under intense illumination I am unable to see anything open here. The remanence of the wound in this area actually look quite healthy. We have been using silver alginate 3/15; 2-week follow-up. Unfortunately not as good today. She has a comma shaped wound on the dorsal foot however the upper part of this is larger. Under illumination debris on the surface She also  tells [place] that she was on her right leg 2 times in the last couple of weeks mostly to reach up for things above her head etc. She felt a sharp pain in the right leg which she thinks is somewhere from the ankle to the knee. The patient has neuropathy and is really uncertain. She cannot feel her foot so she does not think it was coming from there 3/29; 2-week follow-up. Her wound measures smaller. Surface of the wound appears reasonable. She is using silver alginate with underlying Medihoney. She has home health. X-rays I did of her tib-fib last time were negative although it did show arterial calcification 4/12; 2-week follow-up. Her wound measures smaller in length. Using manuka honey with silver alginate on top. She has home health. 4/26; 2-week follow-up. Her wound is smaller but still very adherent debris under illumination requiring debridement she has been using manuka honey with silver alginate. 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 Been using Hydrofera Blue. She is on suppressive Bactrim but apparently she has been taking it twice daily 6/7; I have not seen this wound and about 6 weeks. Since then she was up in [place]. By her own admission she was walking on the foot because she did not have a wheelchair. The wound is not nearly as healthy looking as it was the last time I saw this. We ordered different things for her but she only uses Medihoney and silver alginate. As far as I know she is on suppressive trimethoprim sulfamethoxazole. She does not admit to any fever or chills. Her CBGs apparently are at baseline however she is saying that she feels some discomfort on the lateral part of her ankle I looked over her last inflammatory markers from the summer 2020 at which time she had a deeply necrotic infected wound in this area. On 11/10/2018 her sedimentation rate was 56 and C-reactive  protein 9.9. This was 107 and 29 on 07/29/2018. 6/17; the patient had a necrotic wound the last time she was here on the right dorsal foot. After debridement I did a culture. This showed a very resistant ESBL Klebsiella as well as Enterococcus. Her  x-ray of the foot which was done because of warmth and some discomfort showed bone destruction within the carpal bones involving the navicular acute cuboid lateral middle cuneiforms but essentially unchanged from her prior study which was done on 10/29/2018. The findings were felt to represent chronic osteomyelitis. We did inflammatory markers on her. Her white count was 5.25 sedimentation rate 16 and C-reactive protein at 11.1. Notable for the fact that in August 2020 her CRP was 9.9 and sedimentation rate 56. I have looked at her x-rays. It is true that the bone destruction is very impressive however the patient came into this clinic for the wound on her right foot with pieces of bone literally falling out anteriorly with purulent material. I am not exactly sure I could have expected anything different. She has not been systemically unwell no fever chills or blood sugars have been reasonable. Electronic Signature(s) Signed: 09/18/2019 7:45:08 AM By: Linton Ham MD Entered By: Linton Ham on 09/17/2019 13:40:03 -------------------------------------------------------------------------------- Physical Exam Details Patient Name: Date of Service: Nancy [name] 09/17/2019 12:30 PM Medical Record Number: [id number] Patient Account Number: [id number] Date of Birth/Sex: Treating RN: Sep 15, 1970 (49 y.o. [name] Primary Care Provider: Sanjuana [name], [name] Other Clinician: Referring Provider: Treating Provider/Extender: [name], [name] Weeks in Treatment: 71 Constitutional Patient is hypertensive.. Pulse regular and within target range for patient.Marland Kitchen Respirations regular, non-labored and within target range.. Temperature is  normal and within the target range for the patient.Marland Kitchen Appears in no distress. Respiratory work of breathing is normal. Cardiovascular . Notes Wound exam; Right forefoot; the wound looks cleaner than when I saw this last. No debridement was required. There is no gross purulence and no necrotic tissue. The foot is still warm but not as bad as last visit. She has a new area on the right heel eschar covered. This was removed. Superficial wound in this location. Electronic Signature(s) Signed: 09/18/2019 7:45:08 AM By: Linton Ham MD Entered By: Linton Ham on 09/17/2019 13:41:37 -------------------------------------------------------------------------------- Physician Orders Details Patient Name: Date of Service: Nancy LLO [name] 09/17/2019 12:30 PM Medical Record Number: [id number] Patient Account Number: [id number] Date of Birth/Sex: Treating RN: 1971-03-20 (49 y.o. [name] Primary Care Provider: Sanjuana [name], [name] Other Clinician: Referring Provider: Treating Provider/Extender: [name], [name] Weeks in Treatment: 31 Verbal / Phone Orders: No Diagnosis Coding Follow-up Appointments Return Appointment in 1 week. Dressing Change Frequency Wound #43 Right,Medial Foot Change dressing every day. - home health to change three times a week. Wound #49 Right Calcaneus Change dressing every day. - home health to change three times a week. Wound Cleansing Clean wound with Wound Cleanser - Anasept to all wounds Primary Wound Dressing Wound #43 Right,Medial Foot lginate with Silver - apply over gentamycin ointment. Calcium A Other: - apply topical gentamycin to wound bed. Wound #49 Right Calcaneus Calcium Alginate with Silver Secondary Dressing Wound #43 Right,Medial Foot Kerlix/Rolled Gauze Dry Gauze Edema Control Elevate legs to the level of the heart or above for 30 minutes daily and/or when sitting, a frequency of: - throughout the day [place] skilled nursing for wound care. - Interim Patient Medications llergies: Cleocin, lisinopril A Notifications Medication Indication Start End wound infection 09/17/2019 Augmentin DOSE oral 500 mg-125 mg tablet - 1 tablet oral q24h, give after dialysis on dialysis days for 14 days wound infection 09/17/2019 gentamicin DOSE topical 0.1 % ointment - ointment topical  to wound lightly daily Electronic Signature(s) Signed: 09/17/2019 1:24:17 PM By: Linton Ham MD Entered By: Linton Ham on 09/17/2019 13:24:15 -------------------------------------------------------------------------------- Problem List Details Patient Name: Date of Service: Nancy LLO [name] 09/17/2019 12:30 PM Medical Record Number: [id number] Patient Account Number: [id number] Date of Birth/Sex: Treating RN: 12/04/1970 (49 y.o. [name], Meta.Reding Primary Care Provider: Sanjuana [name], [name] Other Clinician: Referring Provider: Treating Provider/Extender: [name], [name] Weeks in Treatment: 36 Active Problems ICD-10 Encounter Code Description Active Date MDM Diagnosis M86.671 Other chronic osteomyelitis, right ankle and foot 09/03/2018 No Yes L97.514 Non-pressure chronic ulcer of other part of right foot with necrosis of bone 09/03/2018 No Yes L97.411 Non-pressure chronic ulcer of right heel and midfoot limited to breakdown of 09/17/2019 No Yes skin E10.621 Type 1 diabetes mellitus with foot ulcer 09/24/2018 No Yes Inactive Problems ICD-10 Code Description Active Date Inactive Date L97.521 Non-pressure chronic ulcer of other part of left foot limited to breakdown of skin 04/20/2019 04/20/2019 L97.812 Non-pressure chronic ulcer of other part of right lower leg with fat layer exposed 02/24/2019 02/24/2019 Resolved Problems ICD-10 Code Description Active Date Resolved Date L02.415 Cutaneous abscess of right lower limb 12/25/2018 12/25/2018 Electronic Signature(s) Signed:  09/18/2019 7:45:08 AM By: Linton Ham MD Entered By: Linton Ham on 09/17/2019 13:37:11 -------------------------------------------------------------------------------- Progress Note Details Patient Name: Date of Service: Nancy LLO [name] 09/17/2019 12:30 PM Medical Record Number: [id number] Patient Account Number: [id number] Date of Birth/Sex: Treating RN: 05/19/1970 (49 y.o. [name] Primary Care Provider: Sanjuana [name], [name] Other Clinician: Referring Provider: Treating Provider/Extender: [name], [name] Weeks in Treatment: 3 Subjective History of Present Illness (HPI) 49 year old diabetic who is known to have type 1 diabetes which is poorly controlled last hemoglobin A1c was 11%. She comes in with a ulcerated area on the left lateral foot which has been there for over 6 months. Was recently she has been treated by Dr. Amalia [name] of podiatry who saw her last on 05/28/2016. Review of his notes revealed that the patient had incision and drainage with placement of antibiotic beads to the left foot on 04/11/2016 for possible osteomyelitis of the cuboid bone. Over the last year she's had a history of amputation of the left fifth toe and a femoropopliteal popliteal bypass graft somewhere in April 2017. 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 His note Dr. Amalia [name] mentions that the patient has been referred to me for further wound care and possibly great candidate for hyperbaric oxygen therapy due to recurrent osteomyelitis. However we do not have any x-rays of biopsy reports confirming this. He has been on several antibiotics including Bactrim and most recently is on doxycycline for an MRSA. I understand, the patient was not a candidate for IV antibiotics as she has had previous PICC lines which resulted in blood clots in both arms. There was a x-ray report dated 04/04/2016 on Dr. Amalia [name] notes which showed evidence of fifth ray resection left foot  with osteolytic changes noted to the fourth metatarsal and cuboid bone on the left. 06/13/2016 -- had a left foot x-ray which showed no acute fracture or dislocation and no definite radiographic evidence of osteomyelitis. Advanced osteopenia was seen. 06/20/2016 -- she has noticed a new wound on the right plantar foot in the region where she had a callus before. 06/27/16- the patient did have her x-ray of the right foot which showed no findings to suggest osteomyelitis. She saw  her endocrinologist, [name], yesterday. Her A1c in January was 11. He also indicates mismanagement and noncompliance regarding her diabetes. She is currently on Bactrim for a lip infection. She is complaining of nausea, vomiting and diarrhea. She is unable to articulate the exact orders or dosing of the Bactrim; it is unclear when she will complete this. 07/04/2016 -- results from [facility] of ABIs with ankle waveforms were noted from 02/14/2016. The examination done on 06/27/2015 showed noncompressible ABIs with the right being 1.45 and the left being 1.33. The present examination showed a right ABI of 1.19 on the left of 1.33. The conclusion was that right normal ABI in the lower extremity at rest however compared to previous study which was noncompressible ABI may be falsely elevated side suggesting medial calcification. The left ABI suggested medial calcification. 08/01/2016 -- the patient had more redness and pain on her right foot and did not get to come to see as noted she see her PCP or go to the ER and decided to take some leftover metronidazole which she had at home. As usual, the patient does report she feels and is rather noncompliant. 08/08/2016 -- -- x-ray of the right foot -- FINDINGS:Transmetatarsal amputation is noted. No bony destruction is noted to suggest osteomyelitis. IMPRESSION: No evidence of osteomyelitis. Postsurgical changes are seen. MRI would be more sensitive for possible bony  changes. Culture has grown Serratia Marcescens -- sensitive to Bactrim, ciprofloxacin, ceftazidime she was seen by Dr. Daylene [name] on 08/06/2016. He did not find any exposed bone, muscle, tendon, ligament or joint. There was no malodor and he did a excisional debridement in the office. ============ Old notes: 49 year old patient who is known to the wound clinic for a while had been away from the wound clinic since 09/01/2014. Over the last several months she has been admitted to various hospitals including [place]. She was treated for a right metatarsal osteomyelitis with a transmetatarsal amputation and this was done about 2 months ago. He has a small ulcerated area on the right heel and she continues to have an ulcerated area on the left plantar aspect of the foot. The patient was recently admitted to the [facility] hospital group between 7/12 and 10/18/2014. she was given 3 weeks of IV vancomycin and was to follow-up with her surgeons at [facility] and also took oral vancomycin for C. difficile colitis. 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 MRI of the left foot done on 10/14/2014 showed no abscess or osteomyelitis. 04/27/15; this is a patient we know from previous stays in the wound care center. She is a type I diabetic I am not sure of her control currently. Since the last time I saw her she is had a right transmetatarsal amputation and has no wounds on her right foot and has no open wounds. She is been followed at the wound care center at [facility] in [place]. She comes today with the desire to undergo hyperbaric treatment locally. Apparently one of her wound care providers in [place]  has suggested hyperbarics. This is in response to an MRI from 04/18/15 that showed increased marrow signal and loss of the proximal fifth metatarsal cortex evidence of osteomyelitis with likely early osteomyelitis in the cuboid bone as well. She has a large wound over  the base of the fifth metatarsal. She also has a eschar over her the tips of her toes on 1,3 and 5. She does not have peripheral pulses and apparently is going for an angiogram tomorrow which seems reasonable. After this she is going to infectious disease at [facility]. They have been using Medihoney to the large wound on the lateral aspect of the left foot to. The patient has known Charcot deformity from diabetic neuropathy. She also has known diabetic PAD. Surprisingly I can't see that she has had any recent antibiotics, the patient states the last antibiotic she had was at the end of November for 10 days. I think this was in response to culture that showed group G strep although I'm not exactly sure where the culture was from. She is also had arterial studies on 03/29/15. This showed a right ABI of 1.4 that was noncompressible. Her left ABI was 0.73. 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 It was not felt that arterial inflow was adequate for healing of a foot ulcer. Her Doppler waveforms looked monophasic ===== READMISSION 02/28/17; this is in an now 49 year old woman we've had at several different occasions in this clinic. 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 She has a remote ex-smoker. She was last seen in this clinic by Dr. Con Memos I think in May. More recently she is been followed by her podiatrist Dr. Amalia [name] an infectious disease Dr. Megan Salon. She has 2 open wounds the major one is over the right first metatarsal head she also has a wound on the left plantar foot. an MRI of the right foot on 01/01/17 showed a soft tissue ulcer along the plantar aspect of  the first metatarsal base consistent with osteomyelitis of the first metatarsal stump. Dr. 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 According to the patient this is been open for slightly over a month. She has been on a combination of Cipro 500 twice a day, Zyvox 600 twice a day and Flagyl 500 3 times a day for over a month now as directed by Dr. Megan Salon. cultures of the right foot earlier this year showed MRSA in January and Serratia in May. January also had a few viridans strep. Recent x-rays of both feet were done and Dr. Amalia [name] office and I don't have these reports. The patient has known PAD and has a history of aleft femoropopliteal bypass in April 2017. 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 Dwyane Dee. In spite of this the last hemoglobin A1c I can see is 10.1 on 01/01/2017. She is being referred by Dr. 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 She is been using Medihoney to this area and also an area on the left midfoot. She is using healing sandals bilaterally. ABIs in this clinic at the left posterior tibial was 1.1 noncompressible on the right READMISSION Non invasive vascular NOVANT 5/18 Aftercare following surgery of the circulatory system Procedure Note - Interface, External Ris In - 08/13/2016 11:05 AM EDT Procedure: Examination consists of physiologic resting arterial pressures of the brachial and ankle arteries bilaterally with continuous wave Doppler waveform analysis. Previous: Previous exam performed on 02/14/16 demonstrated ABIs of Rt =  1.19 and Lt = 1.33. Right: ABI = non-compressible PT 1.47 DP. S/P transmet amputation. , Left: ABI = 1.52, 2nd digit pressure = 87  mmHg Conclusions: Right: ABI (>1.3) may be falsely elevated, suggesting medial calcification. Left: ABI (>1.3) may be falsely elevated, suggesting medial calcification The patient is a now 49 year old type I diabetic is had multiple issues her graded to chronic diabetic foot ulcers. 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 We had her in the clinic lastin November. At that point she had wounds on her bilateral feet.she had wanted to try hyperbarics however the healogics review process denied her because she hadn't followed up with her vascular surgeon for her left femoropopliteal bypass. The bypass was done by Dr. Raul Del at [facility]. We made her a follow-up with Dr. Raul Del however she did not keep the appointment and therefore she was not approved The patient shows me a small wound on her left fourth metatarsal head on her phone. She developed rapid discoloration in the plantar aspect of the left foot and she was admitted to hospital from 2/2 through 05/10/17 with wet gangrene of the left foot osteomyelitis of the fourth metatarsal heads. She was admitted acutely ill with a temperature of 103. She was started on broad-spectrum vancomycin and cefepime. On 05/06/17 she was taken to the OR by Dr. Amalia [name] her podiatric surgeon for an incision and drainage irrigation of the left foot wound. Cultures from this surgery revealed group be strep and anaerobes. she was seen by Dr.Xu of orthopedic surgery and scheduled for a below-knee amputation which she u refused. Ultimately she was discharged on Levaquin and Flagyl for one month. MRI 05/05/17 done while she was in the hospital showed abscess adjacent to the fourth metatarsal head and neck small abscess around the fourth flexor tendon. Inflammatory phlegmon and gas in the soft tissues along the lateral aspect of the fourth phalanx. Findings worrisome for osteomyelitis involving  the fourth proximal and middle phalanx and also the third and fourth metatarsals. Finally the patient had actually shortly before this followed up with Dr. Raul Del at no time on 04/29/17. He felt that her left femoropopliteal bypass was patent he felt that her left-sided toe pressures more than adequate for healing a wound on the left foot. This was before her acute presentation. 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 05/28/17; she is started hyperbarics. The patient tells me that for some reason she was not actually on Levaquin but I think on ciprofloxacin. She was on Flagyl. She only started her Levaquin yesterday due to some difficulty with the pharmacy and perhaps her sister picking it up. She has an appointment with Dr. Amalia [name] tomorrow and with infectious disease early next week. She has no new complaints 06/06/17; the patient continues in hyperbarics. She saw Dr. Amalia [name] on 05/29/17 who is her podiatric surgeon. He is elected for a transmetatarsal amputation on 06/27/17. I'm not sure at what level he plans to do this amputation. The patient is unaware ooShe also saw Dr. Megan Salon of infectious disease who elected to continue her on current antibiotics I think this is ciprofloxacin and Flagyl. I'll need to clarify with her tomorrow if she actually has this. We're using silver alginate to the actual wound. Necrotic surface today with material under the flap of her foot. ooOriginal MRI showed abscesses as well as osteomyelitis of the proximal and middle fourth phalanx and the third and fourth metatarsal heads 06/11/17; patient continues in  hyperbarics and continues on oral antibiotics. She is doing well. The wound looks better. The necrotic part of this under the flap in her superior foot also looks better. she is been to see Dr. Amalia [name]. I haven't had a chance to look at his note. Apparently he has put the transmetatarsal amputation on hold her request it is still planning to take her to the OR for  debridement and product application ACEL. I'll see if I can find his note. I'll therefore leave product ordering/requests to Dr. Amalia [name] for now. I was going to look at Dermagraft 06/18/17-she is here in follow-up evaluation for bilateral foot wounds. She continues with hyperbaric therapy. She states she has been applying manuka honey to the right plantar foot and alternate manuka honey and silver alginate to the left foot, despite our orders. We will continue with same treatment plan and she will follo up next week. 06/25/17; I have reviewed Dr. Amalia [name] last note from 3/11. She has operative debridement in 2 days' time. By review his note apparently they're going to place there is skin over the majority of this wound which is a good choice. She has a small satellite area at the most proximal part of this wound on the left plantar foot. The area on the right plantar foot we've been using silver alginate and it is close to healing. 07/02/17; unfortunately the patient was not easily approved for Dr. Amalia [name] proposed surgery. I'm not completely certain what the issue is. She has been using silver alginate to the wound she has completed a first course of hyperbarics. She is still on Levaquin and Flagyl. I have really lost track of the time course here.I suspect she should have another week to 2 of antibiotics. I'll need to see if she is followed up with infectious disease Dr. Megan Salon 07/09/17; the patient is followed up with Dr. Megan Salon. 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 She continues on Levaquin and metronidazole continuing both of these for now I think she is been on fr about 6 weeks. She still has some drainage but no pain. No fever. Her had been plans for her to go to the OR for operative debridement with her podiatrist Dr. Amalia [name], I am not exactly sure where that is. I'll probably slip a note to Dr. Amalia [name] today. I note that she follows with Dr. Dwyane Dee of  endocrinology. We have her recertified for hyperbaric oxygen. I have not heard about Dermagraft however I'll see if Dr. Amalia [name] is planning a skin substitute as well 07/16/17; the patient tells me she is just about out of [place]. I'll need to check Dr. Hale Bogus last notes on this. She states she has plenty of Flagyl however. She comes in today complaining of pain in the right lateral foot which she said lasted for about a day. The wound on the right foot is actually much more medially. She also tells me that the [facility] cost a lot of pain in the left foot wound and she turned back to silver alginate. Finally Dermagraft has a $283 per application co-pay. She cannot afford this 07/23/17; patient arrives today with the wound not much smaller. There is not much new to add. She has not heard from Dr. Amalia [name] all try to put in a call to them today. She was asking about Dermagraft again and she has an over $662 per application co-pay she states that she would be willing to try to do  a payment plan. I been tried to avoid this. We've been using silver alginate, I'll change to [facility] 07/30/17-She is here in follow-up evaluation for left foot ulcer. She continues hyperbaric medicine. The left foot ulcer is stable we will continue with same treatment plan 08/06/17; she is here for evaluation of her left foot ulcer. Currently being treated for hyperbarics or underlying osteomyelitis. She is completed antibiotics. The left foot ulcer is better smaller with healthier looking granulation. For various reasons I am not really clear on we never got her back to the OR with Dr. Amalia [name]. He did not respond to my secure text message. Nevertheless I think that surgery on this point is not necessary nor am I completely clear that a skin substitute is necessary The patient is complaining about pain on the outside of her right foot. 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 There is no erythema. She  also states the foot is warm versus her other part of her upper leg and this is largely true. It is not totally clear to me what's causing this. She thinks it's different from her usual neuropathy pain 08/13/17; she arrives in clinic today with a small wound which is superficial on her right first metatarsal head. 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 She tells [place] she was up on her feet over the Mother's Day celebration. ooThe large wound is on the left foot. Continues with hyperbarics for underlying osteomyelitis. We're using Hydrofera Blue. She asked me today about where we were with Dermagraft. I had actually excluded this because of the co-pay however she wants to assume this therefore I'll recheck the co-pay an order for next week. 08/20/17; the patient agreed to accept the co-pay of the first Apligraf which we applied today. She is disappointed she is finishing hyperbarics will run this through the insurance on the extent of the foot infection and the extent of the wound that she had however she is already had 60 dive's. Dermagraft No. 1 08/27/17; Dermagraft No. 2. She is not eligible for any more hyperbaric treatments this month. She reports a fair amount of drainage and she actually changed to the external dressings without disturbing the direct contact layer 09/03/17; the patient arrived in clinic today with the wound superficially looking quite healthy. Nice vibrant red tissue with some advancing epithelialization although not as much adherence of the flap as I might like. However she noted on her own fourth toe some bogginess and she brought that to our attention. Indeed this was boggy feeling like a possibility of subcutaneous fluid. 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 She is not been unwell. We've been using Dermagraft 09/10/17; the culture that I did not last week was MRSA. She saw Dr.  Megan Salon this morning who is going to start her on vancomycin. I had sent him a secure a text message yesterday. I also spoke with her podiatric surgeon Dr. Amalia [name] about surgery on this foot the options for conserving a functional foot etc. Promised me he would see her and will make back consultation today. Paradoxically her actual wound on the plantar aspect of her left foot looks really quite good. I had given her 5 days worth of Baxdella to cover her for MRSA. Her MRI came back showing osteomyelitis within the third metatarsal shaft and head and base of the third and fourth proximal phalanx. She had extensive  inflammatory changes throughout the soft tissue of the lateral forefoot. With an ill-defined fluid around the fourth metatarsal extending into the plantar and dorsal soft tissues 09/19/17; the patient is actually on oral Septra and Flagyl. She apparently refused IV vancomycin. She also saw Dr. Amalia [name] at my request who is planning her for a left BKA sometime in mid July. MRI showed osteomyelitis within the third metatarsal shaft and head and the basis of the third and fourth proximal phalanx. I believe there was felt to be possible septic arthritis involving the third MTP. 09/26/17; the patient went back to Dr. Megan Salon at my suggestion and is now receiving IV daptomycin. Her wound continues to look quite good making the decision to proceed with a transmetatarsal amputation although more difficult for the patient. I believe in my extensive discussions with her she has a good sense of the pros and cons of this. I don't NV the tuft decision she has to make. She has an appointment with Dr. Amalia [name] I believe in mid July and I previously spoken to him about this issue Has we had used 3 previous Dermagraft. Given the condition of the wound surface I went ahead and added the fourth one today area and I did this not fully realizing that she'll be traveling to [place] next week. I'm hopeful she can come back  in 2 weeks 10/21/17; Her same Dermagraft on for about 3-1/2 weeks. In spite of this the wound arrives looking quite healthy. There is been a lot of healing dimensions are smaller. Looking at the square shaped wound she has now there is some undermining and some depth medially under the undermining although I cannot palpate any bone. No surrounding infection is obvious. She has difficult questions about how to look at this going forward vis--vis amputations versus continued medical therapy. T be truthful the wound is looks so o healthy and it is continued to contract. Hard to justify foot surgery at this point although I still told her that I think it might come to that if we are not able to eradicate the underlying MRSA. She is still highly at risk and she understands this 11/06/17 on evaluation today patient appears to be doing better in regard to her foot ulcer. She's been tolerating the dressing changes without complication. Currently she is here for her Dermagraft #6. Her wound continues to make excellent progress at this point. She does not appear to have any evidence of infection which is good news. 11/13/17 on evaluation today patient appears to be doing excellent at this time. She is here for repeat Dermagraft application. This is #7. Overall her wound seems to be making great progress. 12/05/17; the patient arrives with the wound in much better condition than when I last saw this almost 6 weeks ago. She still has a small probing area in the left metatarsal head region on the lateral aspect of her foot. We applied her last Dermagraft today. ooSince the last time she is here she has what appears to a been a blood blister on the plantar aspect of left foot although I don't see this is threatening. There is also a thick raised tissue on the right mid metatarsal head region. This was not there I don't think the last time she was here 3 weeks ago. 12/12/17; the patient continues to have a small  programming area in the left metatarsal head region on the lateral aspect of her foot which was the initial large surgical wound. I applied her last  Apligraf last week. I'm going to use Endoform starting today ooUnfortunately she has an excoriated area in the left mid foot and the right mid foot. The left midfoot looks like a blistered area this was not opened last week it certainly is open today. Using silver alginate on these areas. She promises me she is offloading this. 12/19/17; the small probing area in the left metatarsal head eyes think is shallower. In general her original wound looks better. We've been using Endoform. The area inferiorly that I think was trauma last week still requires debridement a lot of nonviable surface which I removed. She still has an open open area distally in her foot ooSimilarly on the right foot there is tightly adherent surface debris which I removed. Still areas that don't look completely epithelialized. This is a small open area. We used silver alginate on these areas 12/26/2017; the patient did not have the supplies we ordered from last week including the Endoform. The original large wound on the left lateral foot looks healthy. She still has the undermining area that is largely unchanged from last week. She has the same heavily callused raised edged wounds on the right mid and left midfoot. Both of these requiring debridement. We have been using silver alginate on these areas 01/02/2018; there is still supply issues. We are going to try to use Prisma but I am not sure she actually got it from what she is saying. She has a new open area on the lateral aspect of the left fourth toe [previous fifth ray amputation]. Still the one tunneling area over the fourth metatarsal head. The area is in the midfoot bilaterally still have thick callus around them. She is concerned about a raised swelling on the lateral aspect of the foot. However she is  completely insensate 01/10/2018; we are using Prisma to the wounds on her bilateral feet. Surprisingly the tunneling area over the left fourth metatarsal head that was part of her original surgery has closed down. She has a small open area remaining on the incision line. 2 open areas in the midfoot. 02/10/2018; the patient arrives back in clinic after a month hiatus. She was traveling to visit family in [place]. Is fairly clear she was not offloading the areas on her feet. The original wound over the left lateral foot at the level of metatarsal heads is reopened and probes medially by about a centimeter or 2. She notes that a week ago she had purulent drainage come out of an area on the left midfoot. Paradoxically the worst area is actually on the right foot is extensive with purulent drainage. We will use silver alginate today 02/17/2018; the patient has 3 wounds one over the left lateral foot. She still has a small area over the metatarsal heads which is the remnant of her original surgical wound. This has medial probing depth of roughly 1.4 cm somewhat better than last week. The area on the right foot is larger. We have been using silver alginate to all areas. The area on the right foot and left foot that we cultured last week showed both Klebsiella and Proteus. Both of these are quinolone sensitive. The patient put her's self on Bactrim and Flagyl that she had left hanging around from prior antibiotic usages. She was apparently on this last week when she arrived. I did not realize this. Unfortunately the Bactrim will not cover either 1 of these organisms. We will send in Cipro 500 twice daily for a week 03/04/2018; the patient  has 2 wounds on the left foot one is the original wound which was a surgical wound for a deep DFU. At one point this had exposed bone. She still has an area over the fourth metatarsal head that probes about 1.4 cm although I think this is better than last week. I been using  silver nitrate to try and promote tissue adherence and been using silver alginate here. ooShe also has an area in the left midfoot. This has some depth but a small linear wound. Still requiring debridement. ooOn the right midfoot is a circular wound. A lot of thick callus around this area. ooWe have been using silver alginate to all wound areas ooShe is completed the ciprofloxacin I gave her 2 weeks ago. 03/11/2018; the patient continues to have 2 open areas on the left foot 1 of which was the original surgical wound for a deep DFU. Only a small probing area remains although this is not much different from last week we have been using silver alginate. The other area is on the midfoot this is smaller linear but still with some depth. We have been using silver alginate here as well ooOn the right foot she has a small circular wound in the mid aspect. This is not much smaller than last time. We have been using silver alginate here as well 03/18/2018; she has 3 wounds on the left foot the original surgical wound, a very superficial wound in the mid aspect and then finally the area in the mid plantar foot. She arrives in today with a very concerning area in the wound in the mid plantar foot which is her most proximal wound. There is undermining here of roughly 1-1/2 cm superiorly. Serosanguineous drainage. She tells me she had some pain on for over the weekend that shot up her foot into her thigh and she tells me that she had a nodule in the groin area. ooShe has the single wound in the right foot. ooWe are using endoform to both wound areas 03/24/2018; the patient arrives with the original surgical wound in the area on the left midfoot about the same as last week. There is a collection of fluid under the surface of the skin extending from the surgical wound towards the midfoot although it does not reach the midfoot wound. The area on the right foot is about the same. Cultures from last week of  the left midfoot wound showed abundant Klebsiella abundant Enterococcus faecalis and moderate methicillin resistant staph I gave her Levaquin but this would have only covered the Klebsiella. She will need linezolid 04/01/2018; she is taking linezolid but for the first few days only took 1 a day. I have advised her to finish this at twice daily dosing. In any case all of her wounds are a lot better especially on the left foot. The original surgical wound is closed. The area on the left midfoot considerably smaller. The area on the right foot also smaller. 04/08/2018; her original surgical wound/osteomyelitis on the left foot remains closed. She has area on the left foot that is in the midfoot area but she had some streaking towards this. This is not connected with her original wound at least not visually. ooSmall wound on the right midfoot appears somewhat smaller. 04/15/18; both wounds looks better. Original wound is better left midfoot. Using silver alginate 1/21; patient states she uses saltwater soak in, stones or remove callus from around her wounds. She is also concerned about a blood blister she had on  the left foot but it simply resolved on its own. We've been using silver alginate 1/28; the patient arrives today with the same streaking area from her metatarsals laterally [the site of her original surgical wound] down to the middle of her foot. There is some drainage in the subcutaneous area here. This concerns me that there is actually continued ongoing infection in the metatarsals probably the fourth and third. This fixates an MRI of the foot without contrast [chronic renal failure] ooThe wound in the mid part of the foot is small but I wonder whether this area actually connects with the more distal foot. ooThe area on the right midfoot is probably about the same. Callus thick skin around the small wound which I removed with a curette we have been using silver alginate on both wound  areas 2/4; culture I did of the draining site on the left foot last time grew methicillin sensitive staph aureus. MRI of the left foot showed interval resolution of the findings surrounding the third metatarsal joint on the prior study consistent with treated osteomyelitis. Chronic soft tissue ulceration in the plantar and lateral aspect of the forefoot without residual focal fluid collection. No evidence of recurrent osteomyelitis. 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 She tells me she had nausea and vomiting over the weekend and missed 2 days. In general her foot looks much the same. She has a small open area just below the left fourth metatarsal head. A linear area in the left midfoot. Some discoloration extending from the inferior part of this into the left lateral foot although this appears to be superficial. She has a small area on the right midfoot which generally looks smaller after debridement 2/18; the patient is completing his cephalexin and has another 2 days. She continues to have open areas on the left and right foot. 2/25; she is now off antibiotics. The area on the left foot at the site of her original surgical wound has closed yet again. She still has open areas in the mid part of her foot however these appear smaller. The area on the right mid foot looks about the same. We have been using silver alginate She tells me she had a serious hypoglycemic spell at home. She had to have EMS called and get IV dextrose 3/3; disappointing on the left lateral foot large area of necrotic tissue surrounding the linear area. This appears to track up towards the same original surgical wound. Required extensive debridement. The area on the right plantar  foot is not a lot better also using silver 3/12; the culture I did last time showed abundant enterococcus. I have prescribed Augmentin, should cover any unrecognized anaerobes as well. In addition there were a few MRSA and Serratia that would not be well covered although I did not want to give her multiple antibiotics. She comes in today with a new wound in the right midfoot this is not connected with the original wound over her MTP a lot of thick callus tissue around both wounds but once again she said she is not walking on these areas 3/17-Patient comes in for follow-up on the bilateral plantar wounds, the right midfoot and the left plantar wound. Both these are heavily callused surrounding the wounds. We are  continuing to use silver alginate, she is compliant with offloading and states she uses a wheelchair fairly often at home 3/24; both wound areas have thick callus. However things actually look quite a bit better here for the majority of her left foot and the right foot. 3/31; patient continues to have thick callused somewhat irritated looking tissue around the wounds which individually are fairly superficial. There is no evidence of surrounding infection. We have been using silver alginate however I change that to [facility] today 4/17; patient returns to clinic after having a scare with Covid she tested negative in her primary doctor's office. She has been using Hydrofera Blue. She does not have an open area on the right foot. On the left foot she has a small open area with the mid area not completely viable. She showed me pictures of what looks like a hemorrhagic blister from several days ago but that seems to have healed over this was on the lateral left foot 4/21; patient comes in to clinic with both her wounds on her feet closed. However over the weekend she started having pain in her right foot and leg up into the thigh. She felt as though she was running a low-grade fever but did not  take her temperature. She took a doxycycline that she had leftover and yesterday a single Septra and metronidazole. She thinks things feel somewhat better. 4/28; duplex ultrasound I ordered last week was negative for DVT or superficial thrombophlebitis. She is completed the doxycycline I gave her. States she is still having a lot of pain in the right calf and right ankle which is no better than last week. She cannot sleep. She also states she has a temperature of up to 101, coughing and complaining of visual loss in her bilateral eyes. Apparently she was tested for Covid 2 weeks ago at [facility] and that was negative. Readmission: 09/03/18 patient presents back for reevaluation after having been evaluated at the end of April regarding erythema and swelling of her right lower extremity. Subsequently she ended up going to the hospital on 07/29/18 and was admitted not to be discharged until 08/08/18. Unfortunately it was noted during the time that she was in the hospital that she did have methicillin-resistant Staphylococcus aureus as the infection noted at the site. It was also determined that she did have osteomyelitis which appears to be fairly significant. She was treated with vancomycin and in fact is still on IV vancomycin at dialysis currently. This is actually slated to continue until 09/12/18 at least which will be the completion of the six weeks of therapy. Nonetheless based on what I'm seeing at this point I'm not sure she will be anywhere near ready to discontinue antibiotics at that time. Since she was released from the hospital she was seen by Dr. Amalia [name] who is her podiatrist on 08/27/18. His note specifically states that he is recommended that the patient needs of one knee amputation on the right as she has a life- threatening situation that can lead quickly to sepsis. The patient advised she would like to try to save her leg to which Dr. Amalia [name] apparently told her that this was against all medical  advice. She also want to discontinue the Wound VAC which had been initiated due to the fact that she wasn't pleased with how the wound was looking and subsequently she wanted to pursue applying Medihoney at that time. He stated that he did not believe that the right lower extremity was salvageable and  that the patient understood but would still like to attempt hyperbaric option therapy if it could be of any benefit. She was therefore referred back to [place] for further evaluation. He plans to see her back next week. Upon inspection today patient has a significant amount purulent drainage noted from the wound at this point. The bone in the distal portion of her foot also appears to be extremely necrotic and spongy. When I push down on the bone it bubbles and seeps purulent drainage from deeper in the end of the foot. I do not think that this is likely going to heal very well at all and less aggressive surgical debridement were undertaken more than what I believe we can likely do here in our office. 09/12/2018; I have not seen this patient since the most recent hospitalization although she was in our clinic last week. I have reviewed some of her records from a complex hospitalization. 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 There is situation was complicated by MRSA bacteremia and acute on chronic renal failure now on dialysis. She is receiving vancomycin at dialysis. We started her on Dakin's wet-to-dry last week she is changing this daily. There is still purulent drainage coming out of her foot. Although she is apparently "agreeable" to a below-knee amputation which is been suggested by multiple clinicians she wants this to be done in [place]. She apparently has a telehealth visit with that provider sometime in late [place] 6/24. I have told her I think this is probably too long. Nevertheless I could not convince her to allow a local doctor to perform  BKA. 09/19/2018; the patient has a large necrotic area on the right anterior foot. She has had previous transmetatarsal amputations. Culture I did last week showed MRSA nothing else she is on vancomycin at dialysis. She has continued leaking purulent drainage out of the distal part of the large circular wound on the right anterior foot. She apparently went to see Dr. 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 Somehow that translated into her being referred to plastic surgery for debridement of the area. I gather she basically refused amputation although I do not have a copy of Dr. Berenice Primas notes. The patient really wants to have a trial of hyperbaric oxygen. I agreed with initial assessment in this clinic that this was probably too far along to benefit however if she is going to have plastic surgery I think she would benefit from ancillary hyperbaric oxygen. The issue here is that the patient has benefited as maximally as any patient I have ever seen from hyperbaric oxygen therapy. Most recently she had exposed bone on the lateral part of her left foot after a surgical procedure and that actually has closed. She has eschared areas in both heels but no open area. She is remained systemically well. I am not optimistic that anything can be done about this but the patient is very clear that she wants an attempt. The attempt would include a wound VAC further debridements and hyperbaric oxygen along with IV antibiotics. 6/26; I put her in for a trial of hyperbaric oxygen only because of the dramatic response she has had with wounds on her left midfoot earlier this year which was a surgical wound that went straight to her bone over the metatarsal heads and also remotely the left third toe. We will see if we can get this through our review process and insurance. She arrives in  clinic with again purulent material pouring out of necrotic bone on the top of the foot distally. There is  also some concerning erythema on the front of the leg that we marked. It is bit difficult to tell how tender this is because of neuropathy. I note from infectious disease that she had her vancomycin extended. 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 She had the wound VAC on for part of the week. The rest of the time she is putting various things on this including Medihoney, "ionized water" silver sorb gel etc. 7/7; follow-up along with HBO. She is still on vancomycin at dialysis. She has a large open area on the dorsal right foot and a small dark eschar area on her heel. There is a lot less erythema in the area and a lot less tenderness. From an infection point of view I think this is better. She still has a lot of necrosis in the remaining right forefoot [previous TMA] we are still using the wound VAC in this area 7/16; follow-up along with HBO. I put her on linezolid after she finished her vancomycin. We started this last Friday I gave her 2 weeks worth. I had the expectation that she would be operatively debrided by Dr. Marla Roe but that still has not happened yet. Patient phoned the office this week. She arrives for review today after HBO. The distal part of this wound is completely necrotic. Nonviable pieces of tendon bone was still purulent drainage. Also concerning that she has black eschar over the heel that is expanding. I think this may be indicative of infection in this area as well. She has less erythema and warmth in the ankle and calf but still an abnormal exam 7/21 follow-up along with HBO. I will renew her linezolid after checking a CBC with differential monitoring her blood counts especially her platelets. She was supposed to have surgery yesterday but if I am reading things correctly this was canceled after her blood sugar was found to be over 500. I thought Dr. Marla Roe who called me said that they were sending her to the ER but the patient states  that was not the case. 7/28. Follow-up along with HBO. She is on linezolid I still do not have any lab work from dialysis even though I called last week. The patient is concerned about an area on her left lateral foot about the level of the base of her fifth metatarsal. I did not really see anything that ominous here however this patient is in [place] ability to point out problems that she is sensing and she has been accurate in the past Finally she received a call from Dr. Marla Roe who is referring her to another orthopedic surgeon stating that she is too booked up to take her to the operating room now. Was still using a wound VAC on the foot 8/3 -Follow-up after HBO, she is got another week of linezolid, she is to call [facility] for an appointment, x-rays of both feet were reviewed, the left foot x-ray with third MTP joint osteo- Right foot x-ray widespread osteo-in the right midfoot Right ankle x-ray does not show any active evidence of infection 8/11-Patient is seen after HBO, the wounds on the right foot appear to be about the same, the heel wound had some necrotic base over tendon that was debrided with a curette 8/21; patient is seen after HBO. The patient's wound on her dorsal foot actually looks reasonably good and there is  substantial amount of epithelialization however the open area distally still has a lot of necrotic debris partially bone. I cannot really get a good sense of just how deep this probes under the foot. She has been pressuring me this week to order medical maggots through a company in [place] for her. The problem I have is there is not a defined wound area here. On the positive side there is no purulence. She has been to see infectious disease she is still on Septra DS although I have not had a chance to review their notes 8/28; patient is seen in conjunction with HBO. The wounds on her foot continued to improve including the right dorsal foot substantially the, the distal  part of this wound and the area on the right heel. We have been using a wound VAC over this chronically. She is still on trimethoprim as directed by infectious disease 9/4; patient is seen in conjunction with HBO. Right dorsal foot wound substantially anteriorly is better however she continues to have a deep wound in the distal part of this that is not responding. We have been using silver collagen under border foam ooArea on the right plantar medial heel seems better. We have been using Hydrofera Blue 12/12/18 on evaluation today patient appears to be doing about the same with regard to her wound based on prior measurements. She does have some necrotic tissue noted on the lateral aspect of the wound that is going require a little bit of sharp debridement today. This includes what appears to be potentially either severely necrotic bone or tendon. Nonetheless other than that she does not appear to have any severe infection which is good news 9/18; it is been 2 weeks since I saw this wound. She is tolerating HBO well. Continued dramatic improvement in the area on the right dorsal foot. She still has a small wound on the heel that we have been using Hydrofera Blue. She continues with a wound VAC 9/24; patient has to be seen emergently today with a swelling on her right lateral lower leg. She says that she told Dr. Evette Doffing about this and also myself on a couple of occasions but I really have no recollection of this. She is not systemically unwell and her wound really looked good the last time I saw this. She showed this to providers at dialysis and she was able to verify that she was started on cephalexin today for 5 doses at dialysis. She dialyzes on Tuesday Thursday and Saturday. 10/2; patient is seen in conjunction with HBO. The area that is draining on the right anterior medial tibia is more extensive. Copious amounts of serosanguineous drainage with some purulence. We are still using the wound VAC on  the original wound then it is stable. Culture I did of the original IandD showed MRSA I contacted dialysis she is now on vancomycin with dialysis treatments. I asked them to run a month 10/9; patient seen in conjunction with HBO. She had a new spontaneous open area just above the wound on the right medial tibia ankle. More swelling on the right medial tibia. Her wound on the foot looks about the same perhaps slightly better. There is no warmth spreading up her leg but no obvious erythema. her MRI of the foot and ankle and distal tib-fib is not booked for next Friday I discussed this with her in great detail over multiple days. it is likely she has spreading infection upper leg at least involving the distal 25% above the  ankle. She knows that if I refer her to orthopedics for infectious disease they are going to recommend amputation and indeed I am not against this myself. We had a good trial at trying to heal the foot which is what she wanted along with antibiotics debridement and HBO however she clearly has spreading infection [probably staph aureus/MRSA]. Nevertheless she once again tells me she wants to wait the left of the MRI. She still makes comments about having her amputation done in [place]. 10/19; arrives today with significant swelling on the lateral right leg. Last culture I did showed Klebsiella. Multidrug-resistant. Cipro was intermediate sensitivity and that is what I have her on pending her MRI which apparently is going to be done on Thursday this week although this seems to be moving back and forth. She is not systemically unwell. We are using silver alginate on her major wound area on the right medial foot and the draining areas on the right lateral lower leg 10/26; MRI showed extensive abscess in the anterior compartment of the right leg also widespread osteomyelitis involving osseous structures of the midfoot and portions of the hindfoot. Also suspicion for osteomyelitis  anterior aspect of the distal medial malleolus. Culture I did of the purulence once again showed a multidrug-resistant Klebsiella. I have been in contact with nephrology late last week and she has been started on cefepime at dialysis to replace the vancomycin We sent a copy of her MRI report to Dr. Geroge Baseman in [place] who is an orthopedic surgeon. The patient takes great stock in his opinion on this. She says she will go to [place] to have her leg amputated if Dr. Geroge Baseman does not feel there is any salvage options. 11/2; she still is not talk to her orthopedic surgeon in [place]. Apparently he will call her at [phone] this afternoon. The quality of this is she has not allowed me to refer her anywhere. She has been told over and over that she needs this amputated but has not agreed to be referred. She tells me her blood sugar was 600 last night but she has not been febrile. 11/9; she never did got a call from the orthopedic surgeon in [place] therefore that is off the radar. We have arranged to get her see orthopedic surgery at [facility]. She still has a lot of draining purulence coming out of the new abscess in her right leg although that probably came from the osteomyelitis in her right foot and heel. Meanwhile the original wound on the right foot looks very healthy. Continued improvement. The issue is that the last MRI showed osteomyelitis in her right foot extensively she now has an abscess in the right anterior lower leg. There is nobody in [place] who will offer this woman anything but an amputation and to be honest that is probably what she needs. I think she still wants to talk about limb salvage although at this point I just do not see that. She has completed her vancomycin at dialysis which was for the original staph aureus she is still on cefepime for the more recent Klebsiella. She has had a long course of both of these antibiotics which should have benefited the osteomyelitis on  the right foot as well as the abscess. 11/16; apparently [place] elective surgery is shut down because of COVID-19 pandemic. I have reached out to some contacts at [facility] to see if we can get her an orthopedic appointment there. I am concerned about continually leaving this but for the moment everything  is static. In fact her original large wound on this foot is closing down. It is the abscess on the right anterior leg that continues to drain purulent serosanguineous material. She is not currently on any antibiotics however she had a prolonged course of vancomycin [1 month] as well as cefepime for a month 02/24/2019 on evaluation today patient appears to be doing better than the last time I saw her. This is not a patient that I typically see. With that being said I am covering for Dr. Dellia Nims this week and again compared to when I last saw her overall the wounds in particular seem to be doing significantly better which is good news. With that being said the patient tells me several disconcerting things. She has not been able to get in to see anyone for potential debridement in regard to her leg wounds although she tells me that she does not think it is necessary any longer because she is taking care of that herself. She noticed a string coming out of the lower wound on her leg over the last week. The patient states that she subsequently decided that we must of pack something in there and started pulling the string out and as it kept coming and coming she realized this was likely her tendon. With that being said she continued to remove as much of this as she could. She then I subsequently proceeded to using tubes of antibiotic ointment which she will stick down into the wound and then scored as much as she can until she sees it coming out of the other wound opening. She states that in doing this she is actually made things better and there is less redness and irritation. With regard to her foot  wound she does have some necrotic tendon and tissue noted in one small corner but again the actual wound itself seems to be doing better with good granulation in general compared to my last evaluation. 12/7; continued improvement in the wound on the substantial part of the right medial foot. Still a necrotic area inferiorly that required debridement but the rest of this looks very healthy and is contracting. She has 2 wounds on the right lateral leg which were her original drainage sites from her abscess but all of this looks a lot better as well. She has been using silver alginate after putting antibiotic biotic ointment in one wound and watching it come out the other. I have talked to her in some detail today. I had given her names of orthopedic surgeons at [facility] for second opinion on what to do about the right leg. I do not think the patient never called them. She has not been able to get a hold of the orthopedic surgeon in [place] that she had put a lot of faith in as being somebody would give her an opinion that she would trust. I talked to her today and said even if I could get her in to another orthopedic surgeon about the leg which she accept an amputation and she said she would not therefore I am not going to press this issue for the moment 12/14; continued improvement in his substantial wound on the right medial foot. There is still a necrotic area inferiorly with tightly adherent necrotic debris which I have been working on debriding each time she is here. She does not have an orthopedic appointment. Since last time she was here I looked over her cultures which were essentially MRSA on the foot wound and gram-negative  rods in the abscess on the anterior leg. 12/21; continued improvement in the area on the right medial foot. She is not up on this much and that is probably a good thing since I do not know it could support continuous ambulation. She has a small area on the right lateral  leg which were remanence of the IandD's I did because of the abscess. I think she should probably have prophylactic antibiotics I am going to have to look this over to see if we can make an intelligent decision here. In the meantime her major wound is come down nicely. Necrotic area inferiorly is still there but looks a lot better 04/06/2019; she has had some improvement in the overall surface area on the right medial foot somewhat narrowedr both but somewhat longer. The areas on the right lateral leg which were initial IandD sites are superficial. Nothing is present on the right heel. We are using silver alginate to the wound areas 1/18; right medial foot somewhat smaller. Still a deep probing area in the most distal recess of the wound. She has nothing open on the right leg. She has a new wound on the plantar aspect of her left fourth toe which may have come from just pulling skin. The patient using Medihoney on the wound on her foot under silver alginate. I cannot discourage her from this 2/1; 2-week follow-up using silver alginate on the right foot and her left fourth toe. The area on the right dorsal foot is contracted although there is still the deep area in the most distal part of the wound but still has some probing depth. No overt infection 2/15; 2-week follow-up. She continues to have improvement in the surface area on the dorsal right foot. Even the tunneling area from last time is almost closed. The area that was on the plantar part of her left fourth toe over the PIP is indeed closed 3/1; 2-week follow-up. Continued improvement in surface area. The original divot that we have been debriding inferiorly I think has full epithelialization although the epithelialization is gone down into the wound with probably 4 mm of depth. Even under intense illumination I am unable to see anything open here. The remanence of the wound in this area actually look quite healthy. We have been using silver  alginate 3/15; 2-week follow-up. Unfortunately not as good today. She has a comma shaped wound on the dorsal foot however the upper part of this is larger. Under illumination debris on the surface She also tells [place] that she was on her right leg 2 times in the last couple of weeks mostly to reach up for things above her head etc. She felt a sharp pain in the right leg which she thinks is somewhere from the ankle to the knee. The patient has neuropathy and is really uncertain. She cannot feel her foot so she does not think it was coming from there 3/29; 2-week follow-up. Her wound measures smaller. Surface of the wound appears reasonable. She is using silver alginate with underlying Medihoney. She has home health. X-rays I did of her tib-fib last time were negative although it did show arterial calcification 4/12; 2-week follow-up. Her wound measures smaller in length. Using manuka honey with silver alginate on top. She has home health. 4/26; 2-week follow-up. Her wound is smaller but still very adherent debris under illumination requiring debridement she has been using manuka honey with silver alginate. 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 Been using Hydrofera Blue. She is on suppressive Bactrim but apparently she has been taking it twice daily 6/7; I have not seen this wound and about 6 weeks. Since then she was up in [place]. By her own admission she was walking on the foot because she did not have a wheelchair. The wound is not nearly as healthy looking as it was the last time I saw this. We ordered different things for her but she only uses Medihoney and silver alginate. As far as I know she is on suppressive trimethoprim sulfamethoxazole. She does not admit to any fever or chills. Her CBGs apparently are at baseline however she is saying that she feels some discomfort on the lateral  part of her ankle I looked over her last inflammatory markers from the summer 2020 at which time she had a deeply necrotic infected wound in this area. On 11/10/2018 her sedimentation rate was 56 and C-reactive protein 9.9. This was 107 and 29 on 07/29/2018. 6/17; the patient had a necrotic wound the last time she was here on the right dorsal foot. After debridement I did a culture. This showed a very resistant ESBL Klebsiella as well as Enterococcus. Her x-ray of the foot which was done because of warmth and some discomfort showed bone destruction within the carpal bones involving the navicular acute cuboid lateral middle cuneiforms but essentially unchanged from her prior study which was done on 10/29/2018. The findings were felt to represent chronic osteomyelitis. We did inflammatory markers on her. Her white count was 5.25 sedimentation rate 16 and C-reactive protein at 11.1. Notable for the fact that in August 2020 her CRP was 9.9 and sedimentation rate 56. I have looked at her x-rays. It is true that the bone destruction is very impressive however the patient came into this clinic for the wound on her right foot with pieces of bone literally falling out anteriorly with purulent material. I am not exactly sure I could have expected anything different. She has not been systemically unwell no fever chills or blood sugars have been reasonable. Objective Constitutional Patient is hypertensive.. Pulse regular and within target range for patient.Marland Kitchen Respirations regular, non-labored and within target range.. Temperature is normal and within the target range for the patient.Marland Kitchen Appears in no distress. Vitals Time Taken: 12:50 PM, Height: 67 in, Weight: 125 lbs, BMI: 19.6, Temperature: 98.2 F, Pulse: 88 bpm, Respiratory Rate: 18 breaths/min, Blood Pressure: 145/82 mmHg, Capillary Blood Glucose: 211 mg/dl. Respiratory work of breathing is normal. General Notes: Wound exam; ooRight forefoot; the wound  looks cleaner than when I saw this last. No debridement was required. There is no gross purulence and no necrotic tissue. The foot is still warm but not as bad as last visit. ooShe has a new area on the right heel eschar covered. This was removed. Superficial wound in this location. Integumentary (Hair, Skin) Wound #43 status is Open. Original cause of wound was Gradually Appeared. The wound is located on the Right,Medial Foot. The wound measures 4.8cm length x 3.5cm width x 0.1cm depth; 13.195cm^2 area and 1.319cm^3 volume. There is muscle and Fat Layer (Subcutaneous Tissue) Exposed exposed. There is no tunneling or undermining noted. There is a medium amount of serosanguineous drainage noted. The wound margin is flat and intact. There is small (1-33%) pink, pale granulation within the wound bed. There is a large (67-100%) amount of necrotic tissue within the wound bed  including [place]. Wound #49 status is Open. Original cause of wound was Trauma. The wound is located on the Right Calcaneus. The wound measures 0.5cm length x 0.5cm width x 0.1cm depth; 0.196cm^2 area and 0.02cm^3 volume. There is Fat Layer (Subcutaneous Tissue) Exposed exposed. There is no tunneling or undermining noted. There is a medium amount of serosanguineous drainage noted. The wound margin is distinct with the outline attached to the wound base. There is large (67-100%) red granulation within the wound bed. There is no necrotic tissue within the wound bed. 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 Dressing Change Frequency: Wound #43 Right,Medial Foot: Change dressing every day. - home health to change three times  a week. Wound #49 Right Calcaneus: Change dressing every day. - home health to change three times a week. Wound Cleansing: Clean wound with Wound Cleanser - Anasept to all wounds Primary Wound Dressing: Wound #43 Right,Medial Foot: Calcium Alginate with Silver - apply over gentamycin ointment. Other: - apply topical gentamycin to wound bed. Wound #49 Right Calcaneus: Calcium Alginate with Silver Secondary Dressing: Wound #43 Right,Medial Foot: Kerlix/Rolled Gauze Dry Gauze Edema Control: Elevate legs to the level of the heart or above for 30 minutes daily and/or when sitting, a frequency of: - throughout the day Home Health: [facility] for wound care. - Interim The following medication(s) was prescribed: Augmentin oral 500 mg-125 mg tablet 1 tablet oral q24h, give after dialysis on dialysis days for 14 days for wound infection starting 09/17/2019 gentamicin topical 0.1 % ointment ointment topical to wound lightly daily for wound infection starting 09/17/2019 1. We will apply topical gentamicin 0.3% to the wound covered with silver alginate. Change daily. 2. Silver alginate to the small area on the heel 3. Augmentin to cover the Enterococcus faecalis 5/125 daily after dialysis on dialysis days for 2 weeks 4. I spoke to her dialysis center which is at [place]. She is going to get cefepime 2 g for 6 doses which will cover 2 weeks 5. After extensive review I really do not believe that the patient has active osteomyelitis. The x-ray showed bone destruction but given how this wound presented I was not expecting to see anything different. This is not any different from a year ago and her inflammatory markers and white count are acceptable. For now I am simply going to monitor this. The fact that this patient would not accept an amputation for anything short of a life-threatening situation ways into this decision I spent 35 minutes in review of this patient's records,  face-to-face evaluation and preparation of this record, discussion with provider at dialysis Electronic Signature(s) Signed: 09/18/2019 7:45:08 AM By: Linton Ham MD Entered By: Linton Ham on 09/17/2019 13:44:38 -------------------------------------------------------------------------------- SuperBill Details Patient Name: Date of Service: Nancy LLO [name] 09/17/2019 Medical Record Number: [id number] Patient Account Number: [id number] Date of Birth/Sex: Treating RN: 08-02-1970 (49 y.o. [name] Primary Care Provider: Sanjuana [name], [name] Other Clinician: Referring Provider: Treating Provider/Extender: [name], [name] Weeks in Treatment: 54 Diagnosis Coding ICD-10 Codes Code Description [phone] Other chronic osteomyelitis, right ankle and foot L97.514 Non-pressure chronic ulcer of other part of right foot with necrosis of bone  E10.621 Type 1 diabetes mellitus with foot ulcer Facility Procedures CPT4 Code: [id number] Description: 99214 - WOUND CARE VISIT-LEV 4 EST PT Modifier: Quantity: 1 Physician Procedures : CPT4 Code Description Modifier [id number] - WC PHYS LEVEL 4 - EST PT 1 ICD-10 Diagnosis Description M86.671 Other chronic osteomyelitis, right ankle and foot L97.514 Non-pressure chronic ulcer of other part of right foot with necrosis of bone  E10.621 Type 1 diabetes mellitus with foot ulcer Quantity: Electronic Signature(s) Signed: 09/18/2019 7:45:08 AM By: Linton Ham MD Entered By: Linton Ham on 09/17/2019 13:45:43

## 2019-09-28 ENCOUNTER — Encounter (HOSPITAL_BASED_OUTPATIENT_CLINIC_OR_DEPARTMENT_OTHER): Payer: Medicare HMO | Admitting: Internal Medicine

## 2019-09-28 DIAGNOSIS — E10621 Type 1 diabetes mellitus with foot ulcer: Secondary | ICD-10-CM | POA: Diagnosis not present

## 2019-09-28 NOTE — Progress Notes (Signed)
[name], [name] ([id number]) Visit Report for 09/28/2019 HPI Details Patient Name: Date of Service: [name] 09/28/2019 1:15 PM Medical Record Number: [id number] Patient Account Number: [id number] Date of Birth/Sex: Treating RN: 02-21-1971 (49 y.o. Nancy Fetter Primary Care Provider: [name], [name] Other Clinician: Referring Provider: Treating Provider/Extender: [name], [name] Weeks in Treatment: 16 History of Present Illness HPI Description: 49 year old diabetic who is known to have type 1 diabetes which is poorly controlled last hemoglobin A1c was 11%. She comes in with a ulcerated area on the left lateral foot which has been there for over 6 months. Was recently she has been treated by [name] of podiatry who saw her last on 05/28/2016. Review of his notes revealed that the patient had incision and drainage with placement of antibiotic beads to the left foot on 04/11/2016 for possible osteomyelitis of the cuboid bone. Over the last year she's had a history of amputation of the left fifth toe and a femoropopliteal popliteal bypass graft somewhere in April 2017. 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 His note [name] mentions that the patient has been referred to me for further wound care and possibly great candidate for hyperbaric oxygen therapy due to recurrent osteomyelitis. However we do not have any x-rays of biopsy reports confirming this. He has been on several antibiotics including Bactrim and most recently is on doxycycline for an MRSA. I understand, the patient was not a candidate for IV antibiotics as she has had previous PICC lines which resulted in blood clots in both arms. There was a x-ray report dated 04/04/2016 on [name] notes which showed evidence of fifth ray resection left foot with osteolytic changes noted to the fourth metatarsal and cuboid bone on the left. 06/13/2016 -- had a left foot x-ray which  showed no acute fracture or dislocation and no definite radiographic evidence of osteomyelitis. Advanced osteopenia was seen. 06/20/2016 -- she has noticed a new wound on the right plantar foot in the region where she had a callus before. 06/27/16- the patient did have her x-ray of the right foot which showed no findings to suggest osteomyelitis. She saw her endocrinologist, [name], yesterday. Her A1c in January was 11. He also indicates mismanagement and noncompliance regarding her diabetes. She is currently on Bactrim for a lip infection. She is complaining of nausea, vomiting and diarrhea. She is unable to articulate the exact orders or dosing of the Bactrim; it is unclear when she will complete this. 07/04/2016 -- results from [facility] of ABIs with ankle waveforms were noted from 02/14/2016. The examination done on 06/27/2015 showed noncompressible ABIs with the right being 1.45 and the left being 1.33. The present examination showed a right ABI of 1.19 on the left of 1.33. The conclusion was that right normal ABI in the lower extremity at rest however compared to previous study which was noncompressible ABI may be falsely elevated side suggesting medial calcification. The left ABI suggested medial calcification. 08/01/2016 -- the patient had more redness and pain on her right foot and did not get to come to see as noted she see her PCP or go to the ER and decided to take some leftover metronidazole which she had at home. As usual, the patient does report she feels and is rather noncompliant. 08/08/2016 -- -- x-ray of the right foot -- FINDINGS:Transmetatarsal amputation is noted. No bony destruction is noted to suggest osteomyelitis. IMPRESSION: No evidence of  osteomyelitis. Postsurgical changes are seen. MRI would be more sensitive for possible bony changes. Culture has grown Serratia Marcescens -- sensitive to Bactrim, ciprofloxacin, ceftazidime she was seen by [name] on  08/06/2016. He did not find any exposed bone, muscle, tendon, ligament or joint. There was no malodor and he did a excisional debridement in the office. ============ Old notes: 49 year old patient who is known to the wound clinic for a while had been away from the wound clinic since 09/01/2014. Over the last several months she has been admitted to various hospitals including [place] at [place]. She was treated for a right metatarsal osteomyelitis with a transmetatarsal amputation and this was done about 2 months ago. He has a small ulcerated area on the right heel and she continues to have an ulcerated area on the left plantar aspect of the foot. The patient was recently admitted to the [facility] hospital group between 7/12 and 10/18/2014. she was given 3 weeks of IV vancomycin and was to follow-up with her surgeons at [facility] and also took oral vancomycin for C. difficile colitis. 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 MRI of the left foot done on 10/14/2014 showed no abscess or osteomyelitis. 04/27/15; this is a patient we know from previous stays in the wound care center. She is a type I diabetic I am not sure of her control currently. Since the last time I saw her she is had a right transmetatarsal amputation and has no wounds on her right foot and has no open wounds. She is been followed at the wound care center at [facility] in [place]. She comes today with the desire to undergo hyperbaric treatment locally. Apparently one of her wound care providers in [place] has suggested hyperbarics. This is in response to an MRI from 04/18/15 that showed increased marrow signal and loss of the proximal  fifth metatarsal cortex evidence of osteomyelitis with likely early osteomyelitis in the cuboid bone as well. She has a large wound over the base of the fifth metatarsal. She also has a eschar over her the tips of her toes on 1,3 and 5. She does not have peripheral pulses and apparently is going for an angiogram tomorrow which seems reasonable. After this she is going to infectious disease at [facility]. They have been using Medihoney to the large wound on the lateral aspect of the left foot to. The patient has known Charcot deformity from diabetic neuropathy. She also has known diabetic PAD. Surprisingly I can't see that she has had any recent antibiotics, the patient states the last antibiotic she had was at the end of November for 10 days. I think this was in response to culture that showed group G strep although I'm not exactly sure where the culture was from. She is also had arterial studies on 03/29/15. This showed a right ABI of 1.4 that was noncompressible. Her left ABI was 0.73. 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 It was not felt that arterial inflow was adequate for healing of a foot ulcer. Her Doppler waveforms looked monophasic ===== READMISSION 02/28/17; this is in an now 49 year old woman we've had at several different occasions in this clinic. 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 She has  a remote ex-smoker. She was last seen in this clinic by Dr. Con Memos I think in May. More recently she is been followed by her podiatrist [name] an infectious disease Dr. Megan Salon. She has 2 open wounds the major one is over the right first metatarsal head she also has a wound on the left plantar foot. an MRI of the right foot on 01/01/17 showed a soft tissue ulcer along the plantar aspect of the first metatarsal base consistent with osteomyelitis of the first metatarsal stump. Dr. 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 According to the patient this is been open for slightly over a month. She has been on a combination of Cipro 500 twice a day, Zyvox 600 twice a day and Flagyl 500 3 times a day for over a month now as directed by Dr. Megan Salon. cultures of the right foot earlier this year showed MRSA in January and Serratia in May. January also had a few viridans strep. Recent x-rays of both feet were done and [name] office and I don't have these reports. The patient has known PAD and has a history of aleft femoropopliteal bypass in April 2017. 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 Dwyane Dee. In spite of this the last hemoglobin A1c I can see is 10.1 on 01/01/2017. She is being referred by 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 She is been using Medihoney to this area and also an area on the left midfoot. She is using healing sandals bilaterally. ABIs in this clinic at the left posterior tibial was 1.1 noncompressible on the right READMISSION Non invasive vascular NOVANT 5/18 Aftercare following surgery of the circulatory system Procedure Note - Interface, External Ris In - 08/13/2016 11:05 AM EDT Procedure: Examination consists of physiologic resting arterial pressures of the brachial and ankle arteries bilaterally with continuous wave Doppler waveform analysis. Previous: Previous exam performed on 02/14/16 demonstrated ABIs of Rt = 1.19 and Lt = 1.33. Right: ABI = non-compressible PT 1.47 DP. S/P transmet amputation. , Left: ABI = 1.52, 2nd digit pressure = 87 mmHg Conclusions: Right: ABI (>1.3) may be falsely elevated, suggesting medial calcification. Left: ABI (>1.3) may be falsely  elevated, suggesting medial calcification The patient is a now 49 year old type I diabetic is had multiple issues her graded to chronic diabetic foot ulcers. 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 We had her in the clinic lastin November. At that point she had wounds on her bilateral feet.she had wanted to try hyperbarics however the healogics review process denied her because she hadn't followed up with her vascular surgeon for her left femoropopliteal bypass. The bypass was done by Dr. Raul Del at [facility]. We made her a follow-up with Dr. Raul Del however she did not keep the appointment and therefore she was not approved The patient shows me a small wound on her left fourth metatarsal head on her phone. She developed rapid discoloration in the plantar aspect of the left foot and she was admitted to hospital from 2/2 through 05/10/17 with wet gangrene of the left foot osteomyelitis of the fourth metatarsal heads. She was admitted acutely ill with a temperature of 103.  She was started on broad-spectrum vancomycin and cefepime. On 05/06/17 she was taken to the OR by [name] her podiatric surgeon for an incision and drainage irrigation of the left foot wound. Cultures from this surgery revealed group be strep and anaerobes. she was seen by Dr.Xu of orthopedic surgery and scheduled for a below-knee amputation which she u refused. Ultimately she was discharged on Levaquin and Flagyl for one month. MRI 05/05/17 done while she was in the hospital showed abscess adjacent to the fourth metatarsal head and neck small abscess around the fourth flexor tendon. Inflammatory phlegmon and gas in the soft tissues along the lateral aspect of the fourth phalanx. Findings worrisome for osteomyelitis involving the fourth proximal and middle phalanx and also the third and fourth metatarsals. Finally the patient had actually shortly  before this followed up with Dr. Raul Del at no time on 04/29/17. He felt that her left femoropopliteal bypass was patent he felt that her left-sided toe pressures more than adequate for healing a wound on the left foot. This was before her acute presentation. 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 05/28/17; she is started hyperbarics. The patient tells me that for some reason she was not actually on Levaquin but I think on ciprofloxacin. She was on Flagyl. She only started her Levaquin yesterday due to some difficulty with the pharmacy and perhaps her sister picking it up. She has an appointment with [name] tomorrow and with infectious disease early next week. She has no new complaints 06/06/17; the patient continues in hyperbarics. She saw [name] on 05/29/17 who is her podiatric surgeon. He is elected for a transmetatarsal amputation on 06/27/17. I'm not sure at what level he plans to do this amputation. The patient is unaware She also saw Dr. Megan Salon of infectious disease who elected to continue her on current antibiotics I think this is ciprofloxacin and Flagyl. I'll need to clarify with her tomorrow if she actually has this. We're using silver alginate to the actual wound. Necrotic surface today with material under the flap of her foot. Original MRI showed abscesses as well as osteomyelitis of the proximal and middle fourth phalanx and the third and fourth metatarsal heads 06/11/17; patient continues in hyperbarics and continues on oral antibiotics. She is doing well. The wound looks better. The necrotic part of this under the flap in her superior foot also looks better. she is been to see [name]. I haven't had a chance to look at his note. Apparently he has put the transmetatarsal amputation on hold her request it is still planning to take her to the OR for debridement and product application ACEL. I'll see if I can find his note. I'll therefore leave product ordering/requests to [name]  for now. I was going to look at Dermagraft 06/18/17-she is here in follow-up evaluation for bilateral foot wounds. She continues with hyperbaric therapy. She states she has been applying manuka honey to the right plantar foot and alternate manuka honey and silver alginate to the left foot, despite our orders. We will continue with same treatment plan and she will follo up next week. 06/25/17; I have reviewed [name] last note from 3/11. She has operative debridement in 2 days' time. By review his note apparently they're going to place there is skin over the majority of this wound which is a good choice. She has a small satellite area at the most proximal part of this wound on the left plantar foot. The area on the right  plantar foot we've been using silver alginate and it is close to healing. 07/02/17; unfortunately the patient was not easily approved for [name] proposed surgery. I'm not completely certain what the issue is. She has been using silver alginate to the wound she has completed a first course of hyperbarics. She is still on Levaquin and Flagyl. I have really lost track of the time course here.I suspect she should have another week to 2 of antibiotics. I'll need to see if she is followed up with infectious disease Dr. Megan Salon 07/09/17; the patient is followed up with Dr. Megan Salon. 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 She continues on Levaquin and metronidazole continuing both of these for now I think she is been on fr about 6 weeks. She still has some drainage but no pain. No fever. Her had been plans for her to go to the OR for operative debridement with her podiatrist [name], I am not exactly sure where that is. I'll probably slip a note to [name] today. I note that she follows with Dr. Dwyane Dee of endocrinology. We have her recertified for hyperbaric oxygen. I have not heard about Dermagraft however I'll see if [name] is planning a  skin substitute as well 07/16/17; the patient tells me she is just about out of [place]. I'll need to check Dr. Hale Bogus last notes on this. She states she has plenty of Flagyl however. She comes in today complaining of pain in the right lateral foot which she said lasted for about a day. The wound on the right foot is actually much more medially. She also tells me that the [facility] cost a lot of pain in the left foot wound and she turned back to silver alginate. Finally Dermagraft has a $263 per application co-pay. She cannot afford this 07/23/17; patient arrives today with the wound not much smaller. There is not much new to add. She has not heard from [name] all try to put in a call to them today. She was asking about Dermagraft again and she has an over $335 per application co-pay she states that she would be willing to try to do a payment plan. I been tried to avoid this. We've been using silver alginate, I'll change to [facility] 07/30/17-She is here in follow-up evaluation for left foot ulcer. She continues hyperbaric medicine. The left foot ulcer is stable we will continue with same treatment plan 08/06/17; she is here for evaluation of her left foot ulcer. Currently being treated for hyperbarics or underlying osteomyelitis. She is completed antibiotics. The left foot ulcer is better smaller with healthier looking granulation. For various reasons I am not really clear on we never got her back to the OR with [name]. He did not respond to my secure text message. Nevertheless I think that surgery on this point is not necessary nor am I completely clear that a skin substitute is necessary The patient is complaining about pain on the outside of her right foot. 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 There is no erythema. She also states the foot is warm versus her other part of her upper leg and this is largely true. It is not totally clear to me what's causing this. She  thinks it's different from her usual neuropathy pain 08/13/17; she arrives in clinic today with a small wound which is superficial on her right first metatarsal head. 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 She tells [place] she was up on her feet over the Mother's Day celebration. The large wound is on the left foot. Continues with hyperbarics for underlying osteomyelitis. We're using Hydrofera Blue. She asked me today about where we were with Dermagraft. I had actually excluded this because of the co-pay however she wants to assume this therefore I'll recheck the co-pay an order for next week. 08/20/17; the patient agreed to accept the co-pay of the first Apligraf which we applied today. She is disappointed she is finishing hyperbarics will run this through the insurance on the extent of the foot infection and the extent of the wound that she had however she is already had 60 dive's. Dermagraft No. 1 08/27/17; Dermagraft No. 2. She is not eligible for any more hyperbaric treatments this month. She reports a fair amount of drainage and she actually changed to the external dressings without disturbing the direct contact layer 09/03/17; the patient arrived in clinic today with the wound superficially looking quite healthy. Nice vibrant red tissue with some advancing epithelialization although not as much adherence of the flap as I might like. However she noted on her own fourth toe some bogginess and she brought that to our attention. Indeed this was boggy feeling like a possibility of subcutaneous fluid. 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 She is not been unwell. We've been using Dermagraft 09/10/17; the culture that I did not last week was MRSA. She saw Dr. Megan Salon this morning who is going to start her on vancomycin. I had sent him a secure a text message yesterday. I also spoke with her podiatric surgeon  [name] about surgery on this foot the options for conserving a functional foot etc. Promised me he would see her and will make back consultation today. Paradoxically her actual wound on the plantar aspect of her left foot looks really quite good. I had given her 5 days worth of Baxdella to cover her for MRSA. Her MRI came back showing osteomyelitis within the third metatarsal shaft and head and base of the third and fourth proximal phalanx. She had extensive inflammatory changes throughout the soft tissue of the lateral forefoot. With an ill-defined fluid around the fourth metatarsal extending into the plantar and dorsal soft tissues 09/19/17; the patient is actually on oral Septra and Flagyl. She apparently refused IV vancomycin. She also saw [name] at my request who is planning her for a left BKA sometime in mid July. MRI showed osteomyelitis within the third metatarsal shaft and head and the basis of the third and fourth proximal phalanx. I believe there was felt to be possible septic arthritis involving the third MTP. 09/26/17; the patient went back to Dr. Megan Salon at my suggestion and is now receiving IV daptomycin. Her wound continues to look quite good making the decision to proceed with a transmetatarsal amputation although more difficult for the patient. I believe in my extensive discussions with her she has a good sense of the pros and cons of this. I don't NV the tuft decision she has to make. She has an appointment with [name] I believe in mid July and I previously spoken to him about this issue Has we had used 3 previous Dermagraft. Given the condition of the wound surface I went ahead and added the fourth one today area and I did this not fully realizing that she'll be traveling  to [place] next week. I'm hopeful she can come back in 2 weeks 10/21/17; Her same Dermagraft on for about 3-1/2 weeks. In spite of this the wound arrives looking quite healthy. There is been a lot of  healing dimensions are smaller. Looking at the square shaped wound she has now there is some undermining and some depth medially under the undermining although I cannot palpate any bone. No surrounding infection is obvious. She has difficult questions about how to look at this going forward vis--vis amputations versus continued medical therapy. T be truthful the wound is looks o so healthy and it is continued to contract. Hard to justify foot surgery at this point although I still told her that I think it might come to that if we are not able to eradicate the underlying MRSA. She is still highly at risk and she understands this 11/06/17 on evaluation today patient appears to be doing better in regard to her foot ulcer. She's been tolerating the dressing changes without complication. Currently she is here for her Dermagraft #6. Her wound continues to make excellent progress at this point. She does not appear to have any evidence of infection which is good news. 11/13/17 on evaluation today patient appears to be doing excellent at this time. She is here for repeat Dermagraft application. This is #7. Overall her wound seems to be making great progress. 12/05/17; the patient arrives with the wound in much better condition than when I last saw this almost 6 weeks ago. She still has a small probing area in the left metatarsal head region on the lateral aspect of her foot. We applied her last Dermagraft today. Since the last time she is here she has what appears to a been a blood blister on the plantar aspect of left foot although I don't see this is threatening. There is also a thick raised tissue on the right mid metatarsal head region. This was not there I don't think the last time she was here 3 weeks ago. 12/12/17; the patient continues to have a small programming area in the left metatarsal head region on the lateral aspect of her foot which was the initial large surgical wound. I applied her last Apligraf  last week. I'm going to use Endoform starting today Unfortunately she has an excoriated area in the left mid foot and the right mid foot. The left midfoot looks like a blistered area this was not opened last week it certainly is open today. Using silver alginate on these areas. She promises me she is offloading this. 12/19/17; the small probing area in the left metatarsal head eyes think is shallower. In general her original wound looks better. We've been using Endoform. The area inferiorly that I think was trauma last week still requires debridement a lot of nonviable surface which I removed. She still has an open open area distally in her foot Similarly on the right foot there is tightly adherent surface debris which I removed. Still areas that don't look completely epithelialized. This is a small open area. We used silver alginate on these areas 12/26/2017; the patient did not have the supplies we ordered from last week including the Endoform. The original large wound on the left lateral foot looks healthy. She still has the undermining area that is largely unchanged from last week. She has the same heavily callused raised edged wounds on the right mid and left midfoot. Both of these requiring debridement. We have been using silver alginate on these areas 01/02/2018;  there is still supply issues. We are going to try to use Prisma but I am not sure she actually got it from what she is saying. She has a new open area on the lateral aspect of the left fourth toe [previous fifth ray amputation]. Still the one tunneling area over the fourth metatarsal head. The area is in the midfoot bilaterally still have thick callus around them. She is concerned about a raised swelling on the lateral aspect of the foot. However she is completely insensate 01/10/2018; we are using Prisma to the wounds on her bilateral feet. Surprisingly the tunneling area over the left fourth metatarsal head that was part of  her original surgery has closed down. She has a small open area remaining on the incision line. 2 open areas in the midfoot. 02/10/2018; the patient arrives back in clinic after a month hiatus. She was traveling to visit family in [place]. Is fairly clear she was not offloading the areas on her feet. The original wound over the left lateral foot at the level of metatarsal heads is reopened and probes medially by about a centimeter or 2. She notes that a week ago she had purulent drainage come out of an area on the left midfoot. Paradoxically the worst area is actually on the right foot is extensive with purulent drainage. We will use silver alginate today 02/17/2018; the patient has 3 wounds one over the left lateral foot. She still has a small area over the metatarsal heads which is the remnant of her original surgical wound. This has medial probing depth of roughly 1.4 cm somewhat better than last week. The area on the right foot is larger. We have been using silver alginate to all areas. The area on the right foot and left foot that we cultured last week showed both Klebsiella and Proteus. Both of these are quinolone sensitive. The patient put her's self on Bactrim and Flagyl that she had left hanging around from prior antibiotic usages. She was apparently on this last week when she arrived. I did not realize this. Unfortunately the Bactrim will not cover either 1 of these organisms. We will send in Cipro 500 twice daily for a week 03/04/2018; the patient has 2 wounds on the left foot one is the original wound which was a surgical wound for a deep DFU. At one point this had exposed bone. She still has an area over the fourth metatarsal head that probes about 1.4 cm although I think this is better than last week. I been using silver nitrate to try and promote tissue adherence and been using silver alginate here. She also has an area in the left midfoot. This has some depth but a small linear wound.  Still requiring debridement. On the right midfoot is a circular wound. A lot of thick callus around this area. We have been using silver alginate to all wound areas She is completed the ciprofloxacin I gave her 2 weeks ago. 03/11/2018; the patient continues to have 2 open areas on the left foot 1 of which was the original surgical wound for a deep DFU. Only a small probing area remains although this is not much different from last week we have been using silver alginate. The other area is on the midfoot this is smaller linear but still with some depth. We have been using silver alginate here as well On the right foot she has a small circular wound in the mid aspect. This is not much smaller than last  time. We have been using silver alginate here as well 03/18/2018; she has 3 wounds on the left foot the original surgical wound, a very superficial wound in the mid aspect and then finally the area in the mid plantar foot. She arrives in today with a very concerning area in the wound in the mid plantar foot which is her most proximal wound. There is undermining here of roughly 1-1/2 cm superiorly. Serosanguineous drainage. She tells me she had some pain on for over the weekend that shot up her foot into her thigh and she tells me that she had a nodule in the groin area. She has the single wound in the right foot. We are using endoform to both wound areas 03/24/2018; the patient arrives with the original surgical wound in the area on the left midfoot about the same as last week. There is a collection of fluid under the surface of the skin extending from the surgical wound towards the midfoot although it does not reach the midfoot wound. The area on the right foot is about the same. Cultures from last week of the left midfoot wound showed abundant Klebsiella abundant Enterococcus faecalis and moderate methicillin resistant staph I gave her Levaquin but this would have only covered the Klebsiella. She  will need linezolid 04/01/2018; she is taking linezolid but for the first few days only took 1 a day. I have advised her to finish this at twice daily dosing. In any case all of her wounds are a lot better especially on the left foot. The original surgical wound is closed. The area on the left midfoot considerably smaller. The area on the right foot also smaller. 04/08/2018; her original surgical wound/osteomyelitis on the left foot remains closed. She has area on the left foot that is in the midfoot area but she had some streaking towards this. This is not connected with her original wound at least not visually. Small wound on the right midfoot appears somewhat smaller. 04/15/18; both wounds looks better. Original wound is better left midfoot. Using silver alginate 1/21; patient states she uses saltwater soak in, stones or remove callus from around her wounds. She is also concerned about a blood blister she had on the left foot but it simply resolved on its own. We've been using silver alginate 1/28; the patient arrives today with the same streaking area from her metatarsals laterally [the site of her original surgical wound] down to the middle of her foot. There is some drainage in the subcutaneous area here. This concerns me that there is actually continued ongoing infection in the metatarsals probably the fourth and third. This fixates an MRI of the foot without contrast [chronic renal failure] The wound in the mid part of the foot is small but I wonder whether this area actually connects with the more distal foot. The area on the right midfoot is probably about the same. Callus thick skin around the small wound which I removed with a curette we have been using silver alginate on both wound areas 2/4; culture I did of the draining site on the left foot last time grew methicillin sensitive staph aureus. MRI of the left foot showed interval resolution of the findings surrounding the third metatarsal  joint on the prior study consistent with treated osteomyelitis. Chronic soft tissue ulceration in the plantar and lateral aspect of the forefoot without residual focal fluid collection. No evidence of recurrent osteomyelitis. Noted to have the previous amputation of the distal first phalanx and fifth  ray MRI of the right foot showed no evidence of osteomyelitis I am going to treat the patient with a prolonged course of antibiotics directed against MSSA in the left foot 2/11; patient continues on cephalexin. She tells me she had nausea and vomiting over the weekend and missed 2 days. In general her foot looks much the same. She has a small open area just below the left fourth metatarsal head. A linear area in the left midfoot. Some discoloration extending from the inferior part of this into the left lateral foot although this appears to be superficial. She has a small area on the right midfoot which generally looks smaller after debridement 2/18; the patient is completing his cephalexin and has another 2 days. She continues to have open areas on the left and right foot. 2/25; she is now off antibiotics. The area on the left foot at the site of her original surgical wound has closed yet again. She still has open areas in the mid part of her foot however these appear smaller. The area on the right mid foot looks about the same. We have been using silver alginate She tells me she had a serious hypoglycemic spell at home. She had to have EMS called and get IV dextrose 3/3; disappointing on the left lateral foot large area of necrotic tissue surrounding the linear area. This appears to track up towards the same original surgical wound. Required extensive debridement. The area on the right plantar foot is not a lot better also using silver 3/12; the culture I did last time showed abundant enterococcus. I have prescribed Augmentin, should cover any unrecognized anaerobes as well. In addition there were a few  MRSA and Serratia that would not be well covered although I did not want to give her multiple antibiotics. She comes in today with a new wound in the right midfoot this is not connected with the original wound over her MTP a lot of thick callus tissue around both wounds but once again she said she is not walking on these areas 3/17-Patient comes in for follow-up on the bilateral plantar wounds, the right midfoot and the left plantar wound. Both these are heavily callused surrounding the wounds. We are continuing to use silver alginate, she is compliant with offloading and states she uses a wheelchair fairly often at home 3/24; both wound areas have thick callus. However things actually look quite a bit better here for the majority of her left foot and the right foot. 3/31; patient continues to have thick callused somewhat irritated looking tissue around the wounds which individually are fairly superficial. There is no evidence of surrounding infection. We have been using silver alginate however I change that to [facility] today 4/17; patient returns to clinic after having a scare with Covid she tested negative in her primary doctor's office. She has been using Hydrofera Blue. She does not have an open area on the right foot. On the left foot she has a small open area with the mid area not completely viable. She showed me pictures of what looks like a hemorrhagic blister from several days ago but that seems to have healed over this was on the lateral left foot 4/21; patient comes in to clinic with both her wounds on her feet closed. However over the weekend she started having pain in her right foot and leg up into the thigh. She felt as though she was running a low-grade fever but did not take her temperature. She took a  doxycycline that she had leftover and yesterday a single Septra and metronidazole. She thinks things feel somewhat better. 4/28; duplex ultrasound I ordered last week was negative  for DVT or superficial thrombophlebitis. She is completed the doxycycline I gave her. States she is still having a lot of pain in the right calf and right ankle which is no better than last week. She cannot sleep. She also states she has a temperature of up to 101, coughing and complaining of visual loss in her bilateral eyes. Apparently she was tested for Covid 2 weeks ago at [facility] and that was negative. Readmission: 09/03/18 patient presents back for reevaluation after having been evaluated at the end of April regarding erythema and swelling of her right lower extremity. Subsequently she ended up going to the hospital on 07/29/18 and was admitted not to be discharged until 08/08/18. Unfortunately it was noted during the time that she was in the hospital that she did have methicillin-resistant Staphylococcus aureus as the infection noted at the site. It was also determined that she did have osteomyelitis which appears to be fairly significant. She was treated with vancomycin and in fact is still on IV vancomycin at dialysis currently. This is actually slated to continue until 09/12/18 at least which will be the completion of the six weeks of therapy. Nonetheless based on what I'm seeing at this point I'm not sure she will be anywhere near ready to discontinue antibiotics at that time. Since she was released from the hospital she was seen by [name] who is her podiatrist on 08/27/18. His note specifically states that he is recommended that the patient needs of one knee amputation on the right as she has a life- threatening situation that can lead quickly to sepsis. The patient advised she would like to try to save her leg to which [name] apparently told her that this was against all medical advice. She also want to discontinue the Wound VAC which had been initiated due to the fact that she wasn't pleased with how the wound was looking and subsequently she wanted to pursue applying Medihoney at that time.  He stated that he did not believe that the right lower extremity was salvageable and that the patient understood but would still like to attempt hyperbaric option therapy if it could be of any benefit. She was therefore referred back to [place] for further evaluation. He plans to see her back next week. Upon inspection today patient has a significant amount purulent drainage noted from the wound at this point. The bone in the distal portion of her foot also appears to be extremely necrotic and spongy. When I push down on the bone it bubbles and seeps purulent drainage from deeper in the end of the foot. I do not think that this is likely going to heal very well at all and less aggressive surgical debridement were undertaken more than what I believe we can likely do here in our office. 09/12/2018; I have not seen this patient since the most recent hospitalization although she was in our clinic last week. I have reviewed some of her records from a complex hospitalization. 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 There is situation was complicated by MRSA bacteremia and acute on chronic renal failure now on dialysis. She is receiving vancomycin at dialysis. We started her on Dakin's wet-to-dry last week she is changing this daily. There is still purulent drainage coming out of her foot.  Although she is apparently "agreeable" to a below-knee amputation which is been suggested by multiple clinicians she wants this to be done in [place]. She apparently has a telehealth visit with that provider sometime in late [place] 6/24. I have told her I think this is probably too long. Nevertheless I could not convince her to allow a local doctor to perform BKA. 09/19/2018; the patient has a large necrotic area on the right anterior foot. She has had previous transmetatarsal amputations. Culture I did last week showed MRSA nothing else she is on vancomycin at dialysis. She  has continued leaking purulent drainage out of the distal part of the large circular wound on the right anterior foot. She apparently went to see Dr. 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 Somehow that translated into her being referred to plastic surgery for debridement of the area. I gather she basically refused amputation although I do not have a copy of Dr. Berenice Primas notes. The patient really wants to have a trial of hyperbaric oxygen. I agreed with initial assessment in this clinic that this was probably too far along to benefit however if she is going to have plastic surgery I think she would benefit from ancillary hyperbaric oxygen. The issue here is that the patient has benefited as maximally as any patient I have ever seen from hyperbaric oxygen therapy. Most recently she had exposed bone on the lateral part of her left foot after a surgical procedure and that actually has closed. She has eschared areas in both heels but no open area. She is remained systemically well. I am not optimistic that anything can be done about this but the patient is very clear that she wants an attempt. The attempt would include a wound VAC further debridements and hyperbaric oxygen along with IV antibiotics. 6/26; I put her in for a trial of hyperbaric oxygen only because of the dramatic response she has had with wounds on her left midfoot earlier this year which was a surgical wound that went straight to her bone over the metatarsal heads and also remotely the left third toe. We will see if we can get this through our review process and insurance. She arrives in clinic with again purulent material pouring out of necrotic bone on the top of the foot distally. There is also some concerning erythema on the front of the leg that we marked. It is bit difficult to tell how tender this is because of neuropathy. I note from infectious disease that she had her vancomycin extended. All the  cultures of these areas have shown MRSA sensitive to vancomycin. She had the wound VAC on for part of the week. The rest of the time she is putting various things on this including Medihoney, "ionized water" silver sorb gel etc. 7/7; follow-up along with HBO. She is still on vancomycin at dialysis. She has a large open area on the dorsal right foot and a small dark eschar area on her heel. There is a lot less erythema in the area and a lot less tenderness. From an infection point of view I think this is better. She still has a lot of necrosis in the remaining right forefoot [previous TMA] we are still using the wound VAC in this area 7/16; follow-up along with HBO. I put her on linezolid after she finished her vancomycin. We started this last Friday I gave her 2 weeks worth. I had the expectation that she would be operatively debrided by Dr. Marla Roe  but that still has not happened yet. Patient phoned the office this week. She arrives for review today after HBO. The distal part of this wound is completely necrotic. Nonviable pieces of tendon bone was still purulent drainage. Also concerning that she has black eschar over the heel that is expanding. I think this may be indicative of infection in this area as well. She has less erythema and warmth in the ankle and calf but still an abnormal exam 7/21 follow-up along with HBO. I will renew her linezolid after checking a CBC with differential monitoring her blood counts especially her platelets. She was supposed to have surgery yesterday but if I am reading things correctly this was canceled after her blood sugar was found to be over 500. I thought Dr. Marla Roe who called me said that they were sending her to the ER but the patient states that was not the case. 7/28. Follow-up along with HBO. She is on linezolid I still do not have any lab work from dialysis even though I called last week. The patient is concerned about an area on her left lateral  foot about the level of the base of her fifth metatarsal. I did not really see anything that ominous here however this patient is in [place] ability to point out problems that she is sensing and she has been accurate in the past Finally she received a call from Dr. Marla Roe who is referring her to another orthopedic surgeon stating that she is too booked up to take her to the operating room now. Was still using a wound VAC on the foot 8/3 -Follow-up after HBO, she is got another week of linezolid, she is to call ID for an appointment, x-rays of both feet were reviewed, the left foot x-ray with third MTP joint osteo- Right foot x-ray widespread osteo-in the right midfoot Right ankle x-ray does not show any active evidence of infection 8/11-Patient is seen after HBO, the wounds on the right foot appear to be about the same, the heel wound had some necrotic base over tendon that was debrided with a curette 8/21; patient is seen after HBO. The patient's wound on her dorsal foot actually looks reasonably good and there is substantial amount of epithelialization however the open area distally still has a lot of necrotic debris partially bone. I cannot really get a good sense of just how deep this probes under the foot. She has been pressuring me this week to order medical maggots through a company in [place] for her. The problem I have is there is not a defined wound area here. On the positive side there is no purulence. She has been to see infectious disease she is still on Septra DS although I have not had a chance to review their notes 8/28; patient is seen in conjunction with HBO. The wounds on her foot continued to improve including the right dorsal foot substantially the, the distal part of this wound and the area on the right heel. We have been using a wound VAC over this chronically. She is still on trimethoprim as directed by infectious disease 9/4; patient is seen in conjunction with HBO.  Right dorsal foot wound substantially anteriorly is better however she continues to have a deep wound in the distal part of this that is not responding. We have been using silver collagen under border foam Area on the right plantar medial heel seems better. We have been using Hydrofera Blue 12/12/18 on evaluation today patient appears to be  doing about the same with regard to her wound based on prior measurements. She does have some necrotic tissue noted on the lateral aspect of the wound that is going require a little bit of sharp debridement today. This includes what appears to be potentially either severely necrotic bone or tendon. Nonetheless other than that she does not appear to have any severe infection which is good news 9/18; it is been 2 weeks since I saw this wound. She is tolerating HBO well. Continued dramatic improvement in the area on the right dorsal foot. She still has a small wound on the heel that we have been using Hydrofera Blue. She continues with a wound VAC 9/24; patient has to be seen emergently today with a swelling on her right lateral lower leg. She says that she told Dr. Evette Doffing about this and also myself on a couple of occasions but I really have no recollection of this. She is not systemically unwell and her wound really looked good the last time I saw this. She showed this to providers at dialysis and she was able to verify that she was started on cephalexin today for 5 doses at dialysis. She dialyzes on Tuesday Thursday and Saturday. 10/2; patient is seen in conjunction with HBO. The area that is draining on the right anterior medial tibia is more extensive. Copious amounts of serosanguineous drainage with some purulence. We are still using the wound VAC on the original wound then it is stable. Culture I did of the original IandD showed MRSA I contacted dialysis she is now on vancomycin with dialysis treatments. I asked them to run a month 10/9; patient seen in  conjunction with HBO. She had a new spontaneous open area just above the wound on the right medial tibia ankle. More swelling on the right medial tibia. Her wound on the foot looks about the same perhaps slightly better. There is no warmth spreading up her leg but no obvious erythema. her MRI of the foot and ankle and distal tib-fib is not booked for next Friday I discussed this with her in great detail over multiple days. it is likely she has spreading infection upper leg at least involving the distal 25% above the ankle. She knows that if I refer her to orthopedics for infectious disease they are going to recommend amputation and indeed I am not against this myself. We had a good trial at trying to heal the foot which is what she wanted along with antibiotics debridement and HBO however she clearly has spreading infection [probably staph aureus/MRSA]. Nevertheless she once again tells me she wants to wait the left of the MRI. She still makes comments about having her amputation done in [place]. 10/19; arrives today with significant swelling on the lateral right leg. Last culture I did showed Klebsiella. Multidrug-resistant. Cipro was intermediate sensitivity and that is what I have her on pending her MRI which apparently is going to be done on Thursday this week although this seems to be moving back and forth. She is not systemically unwell. We are using silver alginate on her major wound area on the right medial foot and the draining areas on the right lateral lower leg 10/26; MRI showed extensive abscess in the anterior compartment of the right leg also widespread osteomyelitis involving osseous structures of the midfoot and portions of the hindfoot. Also suspicion for osteomyelitis anterior aspect of the distal medial malleolus. Culture I did of the purulence once again showed a multidrug-resistant Klebsiella. I have been  in contact with nephrology late last week and she has been started on  cefepime at dialysis to replace the vancomycin We sent a copy of her MRI report to Dr. Geroge Baseman in [place] who is an orthopedic surgeon. The patient takes great stock in his opinion on this. She says she will go to [place] to have her leg amputated if Dr. Geroge Baseman does not feel there is any salvage options. 11/2; she still is not talk to her orthopedic surgeon in [place]. Apparently he will call her at [phone] this afternoon. The quality of this is she has not allowed me to refer her anywhere. She has been told over and over that she needs this amputated but has not agreed to be referred. She tells me her blood sugar was 600 last night but she has not been febrile. 11/9; she never did got a call from the orthopedic surgeon in [place] therefore that is off the radar. We have arranged to get her see orthopedic surgery at [facility]. She still has a lot of draining purulence coming out of the new abscess in her right leg although that probably came from the osteomyelitis in her right foot and heel. Meanwhile the original wound on the right foot looks very healthy. Continued improvement. The issue is that the last MRI showed osteomyelitis in her right foot extensively she now has an abscess in the right anterior lower leg. There is nobody in [place] who will offer this woman anything but an amputation and to be honest that is probably what she needs. I think she still wants to talk about limb salvage although at this point I just do not see that. She has completed her vancomycin at dialysis which was for the original staph aureus she is still on cefepime for the more recent Klebsiella. She has had a long course of both of these antibiotics which should have benefited the osteomyelitis on the right foot as well as the abscess. 11/16; apparently [place] elective surgery is shut down because of COVID-19 pandemic. I have reached out to some contacts at [facility] to see if we can get her an  orthopedic appointment there. I am concerned about continually leaving this but for the moment everything is static. In fact her original large wound on this foot is closing down. It is the abscess on the right anterior leg that continues to drain purulent serosanguineous material. She is not currently on any antibiotics however she had a prolonged course of vancomycin [1 month] as well as cefepime for a month 02/24/2019 on evaluation today patient appears to be doing better than the last time I saw her. This is not a patient that I typically see. With that being said I am covering for Dr. Dellia Nims this week and again compared to when I last saw her overall the wounds in particular seem to be doing significantly better which is good news. With that being said the patient tells me several disconcerting things. She has not been able to get in to see anyone for potential debridement in regard to her leg wounds although she tells me that she does not think it is necessary any longer because she is taking care of that herself. She noticed a string coming out of the lower wound on her leg over the last week. The patient states that she subsequently decided that we must of pack something in there and started pulling the string out and as it kept coming and coming she realized this was likely her  tendon. With that being said she continued to remove as much of this as she could. She then I subsequently proceeded to using tubes of antibiotic ointment which she will stick down into the wound and then scored as much as she can until she sees it coming out of the other wound opening. She states that in doing this she is actually made things better and there is less redness and irritation. With regard to her foot wound she does have some necrotic tendon and tissue noted in one small corner but again the actual wound itself seems to be doing better with good granulation in general compared to my last evaluation. 12/7;  continued improvement in the wound on the substantial part of the right medial foot. Still a necrotic area inferiorly that required debridement but the rest of this looks very healthy and is contracting. She has 2 wounds on the right lateral leg which were her original drainage sites from her abscess but all of this looks a lot better as well. She has been using silver alginate after putting antibiotic biotic ointment in one wound and watching it come out the other. I have talked to her in some detail today. I had given her names of orthopedic surgeons at [facility] for second opinion on what to do about the right leg. I do not think the patient never called them. She has not been able to get a hold of the orthopedic surgeon in [place] that she had put a lot of faith in as being somebody would give her an opinion that she would trust. I talked to her today and said even if I could get her in to another orthopedic surgeon about the leg which she accept an amputation and she said she would not therefore I am not going to press this issue for the moment 12/14; continued improvement in his substantial wound on the right medial foot. There is still a necrotic area inferiorly with tightly adherent necrotic debris which I have been working on debriding each time she is here. She does not have an orthopedic appointment. Since last time she was here I looked over her cultures which were essentially MRSA on the foot wound and gram-negative rods in the abscess on the anterior leg. 12/21; continued improvement in the area on the right medial foot. She is not up on this much and that is probably a good thing since I do not know it could support continuous ambulation. She has a small area on the right lateral leg which were remanence of the IandD's I did because of the abscess. I think she should probably have prophylactic antibiotics I am going to have to look this over to see if we can make an intelligent  decision here. In the meantime her major wound is come down nicely. Necrotic area inferiorly is still there but looks a lot better 04/06/2019; she has had some improvement in the overall surface area on the right medial foot somewhat narrowedr both but somewhat longer. The areas on the right lateral leg which were initial IandD sites are superficial. Nothing is present on the right heel. We are using silver alginate to the wound areas 1/18; right medial foot somewhat smaller. Still a deep probing area in the most distal recess of the wound. She has nothing open on the right leg. She has a new wound on the plantar aspect of her left fourth toe which may have come from just pulling skin. The patient using Medihoney on  the wound on her foot under silver alginate. I cannot discourage her from this 2/1; 2-week follow-up using silver alginate on the right foot and her left fourth toe. The area on the right dorsal foot is contracted although there is still the deep area in the most distal part of the wound but still has some probing depth. No overt infection 2/15; 2-week follow-up. She continues to have improvement in the surface area on the dorsal right foot. Even the tunneling area from last time is almost closed. The area that was on the plantar part of her left fourth toe over the PIP is indeed closed 3/1; 2-week follow-up. Continued improvement in surface area. The original divot that we have been debriding inferiorly I think has full epithelialization although the epithelialization is gone down into the wound with probably 4 mm of depth. Even under intense illumination I am unable to see anything open here. The remanence of the wound in this area actually look quite healthy. We have been using silver alginate 3/15; 2-week follow-up. Unfortunately not as good today. She has a comma shaped wound on the dorsal foot however the upper part of this is larger. Under illumination debris on the surface She also  tells [place] that she was on her right leg 2 times in the last couple of weeks mostly to reach up for things above her head etc. She felt a sharp pain in the right leg which she thinks is somewhere from the ankle to the knee. The patient has neuropathy and is really uncertain. She cannot feel her foot so she does not think it was coming from there 3/29; 2-week follow-up. Her wound measures smaller. Surface of the wound appears reasonable. She is using silver alginate with underlying Medihoney. She has home health. X-rays I did of her tib-fib last time were negative although it did show arterial calcification 4/12; 2-week follow-up. Her wound measures smaller in length. Using manuka honey with silver alginate on top. She has home health. 4/26; 2-week follow-up. Her wound is smaller but still very adherent debris under illumination requiring debridement she has been using manuka honey with silver alginate. 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 Been using Hydrofera Blue. She is on suppressive Bactrim but apparently she has been taking it twice daily 6/7; I have not seen this wound and about 6 weeks. Since then she was up in [place]. By her own admission she was walking on the foot because she did not have a wheelchair. The wound is not nearly as healthy looking as it was the last time I saw this. We ordered different things for her but she only uses Medihoney and silver alginate. As far as I know she is on suppressive trimethoprim sulfamethoxazole. She does not admit to any fever or chills. Her CBGs apparently are at baseline however she is saying that she feels some discomfort on the lateral part of her ankle I looked over her last inflammatory markers from the summer 2020 at which time she had a deeply necrotic infected wound in this area. On 11/10/2018 her sedimentation rate was 56 and C-reactive  protein 9.9. This was 107 and 29 on 07/29/2018. 6/17; the patient had a necrotic wound the last time she was here on the right dorsal foot. After debridement I did a culture. This showed a very resistant ESBL Klebsiella as well as Enterococcus. Her  x-ray of the foot which was done because of warmth and some discomfort showed bone destruction within the carpal bones involving the navicular acute cuboid lateral middle cuneiforms but essentially unchanged from her prior study which was done on 10/29/2018. The findings were felt to represent chronic osteomyelitis. We did inflammatory markers on her. Her white count was 5.25 sedimentation rate 16 and C-reactive protein at 11.1. Notable for the fact that in August 2020 her CRP was 9.9 and sedimentation rate 56. I have looked at her x-rays. It is true that the bone destruction is very impressive however the patient came into this clinic for the wound on her right foot with pieces of bone literally falling out anteriorly with purulent material. I am not exactly sure I could have expected anything different. She has not been systemically unwell no fever chills or blood sugars have been reasonable. 6/28; she arrives with a right heel closed. The substantial area on the right anterior foot looks healthy. Much better looking surface. I think we can change to [facility] seems to help this previously. She is getting her antibiotics at dialysis she should be just about finished that about now. Electronic Signature(s) Signed: 09/28/2019 5:46:39 PM By: Linton Ham MD Entered By: Linton Ham on 09/28/2019 13:30:11 -------------------------------------------------------------------------------- Physical Exam Details Patient Name: Date of Service: [name] 09/28/2019 1:15 PM Medical Record Number: [id number] Patient Account Number: [id number] Date of Birth/Sex: Treating RN: 07-02-1970 (49 y.o. Nancy Fetter Primary Care Provider: [name],  [name] Other Clinician: Referring Provider: Treating Provider/Extender: [name], [name] Weeks in Treatment: 38 Constitutional Patient is hypertensive.. Pulse regular and within target range for patient.Marland Kitchen Respirations regular, non-labored and within target range.. Temperature is normal and within the target range for the patient.Marland Kitchen Appears in no distress. Cardiovascular Dorsalis pedis and posterior tibial pulses are palpable. Notes Wound exam Right forefoot the wound looks better than the last time I saw this. Healthier looking tissue there is epithelialization coming from the inferior part no evidence of infection no surrounding erythema The right heel has closed over which was new last time she was here Electronic Signature(s) Signed: 09/28/2019 5:46:39 PM By: Linton Ham MD Entered By: Linton Ham on 09/28/2019 13:31:11 -------------------------------------------------------------------------------- Physician Orders Details Patient Name: Date of Service: [name] 09/28/2019 1:15 PM Medical Record Number: [id number] Patient Account Number: [id number] Date of Birth/Sex: Treating RN: May 04, 1970 (49 y.o. Nancy Fetter Primary Care Provider: [name], [name] Other Clinician: Referring Provider: Treating Provider/Extender: [name], [name] Weeks in Treatment: 51 Verbal / Phone Orders: No Diagnosis Coding ICD-10 Coding Code Description M86.671 Other chronic osteomyelitis, right ankle and foot L97.514 Non-pressure chronic ulcer of other part of right foot with necrosis of bone L97.411 Non-pressure chronic ulcer of right heel and midfoot limited to breakdown of skin E10.621 Type 1 diabetes mellitus with foot ulcer Follow-up Appointments Return Appointment in 2 weeks. Dressing Change Frequency Wound #43 Right,Medial Foot Change Dressing every other day. Wound Cleansing Clean wound with Wound Cleanser - or normal  saline Primary Wound Dressing Wound #43 Right,Medial Foot Hydrofera Blue - Classic Secondary Dressing Wound #43 Right,Medial Foot Kerlix/Rolled Gauze Dry Gauze Edema Control Elevate legs to the level of the heart or above for 30 minutes daily and/or when sitting, a frequency of: - throughout the day [facility] skilled [facility] for wound care. - Interim Electronic Signature(s) Signed: 09/28/2019 5:46:39 PM  By: Linton Ham MD Signed: 09/28/2019 6:01:25 PM By: [name] RN, BSN Entered By: [name] on 09/28/2019 13:27:18 -------------------------------------------------------------------------------- Problem List Details Patient Name: Date of Service: [name] 09/28/2019 1:15 PM Medical Record Number: [id number] Patient Account Number: [id number] Date of Birth/Sex: Treating RN: 1970-11-23 (49 y.o. Nancy Fetter Primary Care Provider: [name], [name] Other Clinician: Referring Provider: Treating Provider/Extender: [name], [name] Weeks in Treatment: 34 Active Problems ICD-10 Encounter Code Description Active Date MDM Diagnosis M86.671 Other chronic osteomyelitis, right ankle and foot 09/03/2018 No Yes L97.514 Non-pressure chronic ulcer of other part of right foot with necrosis of bone 09/03/2018 No Yes L97.411 Non-pressure chronic ulcer of right heel and midfoot limited to breakdown of 09/17/2019 No Yes skin E10.621 Type 1 diabetes mellitus with foot ulcer 09/24/2018 No Yes Inactive Problems ICD-10 Code Description Active Date Inactive Date L97.521 Non-pressure chronic ulcer of other part of left foot limited to breakdown of skin 04/20/2019 04/20/2019 L97.812 Non-pressure chronic ulcer of other part of right lower leg with fat layer exposed 02/24/2019 02/24/2019 Resolved Problems ICD-10 Code Description Active Date Resolved Date L02.415 Cutaneous abscess of right lower limb 12/25/2018 12/25/2018 Electronic  Signature(s) Signed: 09/28/2019 5:46:39 PM By: Linton Ham MD Entered By: Linton Ham on 09/28/2019 13:28:50 -------------------------------------------------------------------------------- Progress Note Details Patient Name: Date of Service: [name] 09/28/2019 1:15 PM Medical Record Number: [id number] Patient Account Number: [id number] Date of Birth/Sex: Treating RN: 06-26-1970 (49 y.o. Nancy Fetter Primary Care Provider: [name], [name] Other Clinician: Referring Provider: Treating Provider/Extender: [name], [name] Weeks in Treatment: 55 Subjective History of Present Illness (HPI) 49 year old diabetic who is known to have type 1 diabetes which is poorly controlled last hemoglobin A1c was 11%. She comes in with a ulcerated area on the left lateral foot which has been there for over 6 months. Was recently she has been treated by [name] of podiatry who saw her last on 05/28/2016. Review of his notes revealed that the patient had incision and drainage with placement of antibiotic beads to the left foot on 04/11/2016 for possible osteomyelitis of the cuboid bone. Over the last year she's had a history of amputation of the left fifth toe and a femoropopliteal popliteal bypass graft somewhere in April 2017. 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 His note [name] mentions that the patient has been referred to me for further wound care and possibly great candidate for hyperbaric oxygen therapy due to recurrent osteomyelitis. However we do not have any x-rays of biopsy reports confirming this. He has been on several antibiotics including Bactrim and most recently is on doxycycline for an MRSA. I understand, the patient was not a candidate for IV antibiotics as she has had previous PICC lines which resulted in blood clots in both arms. There was a x-ray report dated 04/04/2016 on [name] notes which showed evidence of fifth  ray resection left foot with osteolytic changes noted to the fourth metatarsal and cuboid bone on the left. 06/13/2016 -- had a left foot x-ray which showed no acute fracture or dislocation and no definite radiographic evidence of osteomyelitis. Advanced osteopenia was seen. 06/20/2016 -- she has noticed a new wound on the right plantar foot in the region where she had a callus before. 06/27/16- the patient did have her x-ray of the right foot which showed no findings to suggest osteomyelitis. She saw her  endocrinologist, [name], yesterday. Her A1c in January was 11. He also indicates mismanagement and noncompliance regarding her diabetes. She is currently on Bactrim for a lip infection. She is complaining of nausea, vomiting and diarrhea. She is unable to articulate the exact orders or dosing of the Bactrim; it is unclear when she will complete this. 07/04/2016 -- results from [facility] of ABIs with ankle waveforms were noted from 02/14/2016. The examination done on 06/27/2015 showed noncompressible ABIs with the right being 1.45 and the left being 1.33. The present examination showed a right ABI of 1.19 on the left of 1.33. The conclusion was that right normal ABI in the lower extremity at rest however compared to previous study which was noncompressible ABI may be falsely elevated side suggesting medial calcification. The left ABI suggested medial calcification. 08/01/2016 -- the patient had more redness and pain on her right foot and did not get to come to see as noted she see her PCP or go to the ER and decided to take some leftover metronidazole which she had at home. As usual, the patient does report she feels and is rather noncompliant. 08/08/2016 -- -- x-ray of the right foot -- FINDINGS:Transmetatarsal amputation is noted. No bony destruction is noted to suggest osteomyelitis. IMPRESSION: No evidence of osteomyelitis. Postsurgical changes are seen. MRI would be more sensitive  for possible bony changes. Culture has grown Serratia Marcescens -- sensitive to Bactrim, ciprofloxacin, ceftazidime she was seen by [name] on 08/06/2016. He did not find any exposed bone, muscle, tendon, ligament or joint. There was no malodor and he did a excisional debridement in the office. ============ Old notes: 49 year old patient who is known to the wound clinic for a while had been away from the wound clinic since 09/01/2014. Over the last several months she has been admitted to various hospitals including [facility] [place]. She was treated for a right metatarsal osteomyelitis with a transmetatarsal amputation and this was done about 2 months ago. He has a small ulcerated area on the right heel and she continues to have an ulcerated area on the left plantar aspect of the foot. The patient was recently admitted to the [facility] group between 7/12 and 10/18/2014. she was given 3 weeks of IV vancomycin and was to follow-up with her surgeons at [facility] and also took oral vancomycin for C. difficile colitis. 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 MRI of the left foot done on 10/14/2014 showed no abscess or osteomyelitis. 04/27/15; this is a patient we know from previous stays in the wound care center. She is a type I diabetic I am not sure of her control currently. Since the last time I saw her she is had a right transmetatarsal amputation and has no wounds on her right foot and has no open wounds. She is been followed at the wound care center at [facility] in [place]. She comes today with the desire to undergo hyperbaric treatment locally. Apparently one of her wound care providers  in [place] has suggested hyperbarics. This is in response to an MRI from 04/18/15 that showed increased marrow signal and loss of the proximal fifth metatarsal cortex evidence of osteomyelitis with likely early osteomyelitis in the cuboid bone as well. She has a large wound over the  base of the fifth metatarsal. She also has a eschar over her the tips of her toes on 1,3 and 5. She does not have peripheral pulses and apparently is going for an angiogram tomorrow which seems reasonable. After this she is going to infectious disease at [facility]. They have been using Medihoney to the large wound on the lateral aspect of the left foot to. The patient has known Charcot deformity from diabetic neuropathy. She also has known diabetic PAD. Surprisingly I can't see that she has had any recent antibiotics, the patient states the last antibiotic she had was at the end of November for 10 days. I think this was in response to culture that showed group G strep although I'm not exactly sure where the culture was from. She is also had arterial studies on 03/29/15. This showed a right ABI of 1.4 that was noncompressible. Her left ABI was 0.73. 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 It was not felt that arterial inflow was adequate for healing of a foot ulcer. Her Doppler waveforms looked monophasic ===== READMISSION 02/28/17; this is in an now 49 year old woman we've had at several different occasions in this clinic. 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 She has a remote ex-smoker. She was last seen in this clinic by Dr. Con Memos I think in May. More recently she is been followed by her podiatrist [name] an infectious disease Dr. Megan Salon. She has 2 open wounds the major one is over the right first metatarsal head she also has a wound on the left plantar foot. an MRI of the right foot on 01/01/17 showed a soft tissue ulcer along the  plantar aspect of the first metatarsal base consistent with osteomyelitis of the first metatarsal stump. Dr. 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 According to the patient this is been open for slightly over a month. She has been on a combination of Cipro 500 twice a day, Zyvox 600 twice a day and Flagyl 500 3 times a day for over a month now as directed by Dr. Megan Salon. cultures of the right foot earlier this year showed MRSA in January and Serratia in May. January also had a few viridans strep. Recent x-rays of both feet were done and [name] office and I don't have these reports. The patient has known PAD and has a history of aleft femoropopliteal bypass in April 2017. 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 Dwyane Dee. In spite of this the last hemoglobin A1c I can see is 10.1 on 01/01/2017. She is being referred by 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 She is been using Medihoney to this area and also an area on the left midfoot. She is using healing sandals bilaterally. ABIs in this clinic at the left posterior tibial was 1.1 noncompressible on the right READMISSION Non invasive vascular NOVANT 5/18 Aftercare following surgery of the circulatory system Procedure Note - Interface, External Ris In - 08/13/2016 11:05 AM EDT Procedure: Examination consists of physiologic resting arterial pressures of the brachial and ankle arteries bilaterally with continuous wave Doppler waveform analysis. Previous: Previous exam performed on 02/14/16 demonstrated ABIs of Rt =  1.19 and Lt = 1.33. Right: ABI = non-compressible PT 1.47 DP. S/P transmet amputation. , Left: ABI = 1.52, 2nd  digit pressure = 87 mmHg Conclusions: Right: ABI (>1.3) may be falsely elevated, suggesting medial calcification. Left: ABI (>1.3) may be falsely elevated, suggesting medial calcification The patient is a now 49 year old type I diabetic is had multiple issues her graded to chronic diabetic foot ulcers. 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 We had her in the clinic lastin November. At that point she had wounds on her bilateral feet.she had wanted to try hyperbarics however the healogics review process denied her because she hadn't followed up with her vascular surgeon for her left femoropopliteal bypass. The bypass was done by Dr. Raul Del at [facility]. We made her a follow-up with Dr. Raul Del however she did not keep the appointment and therefore she was not approved The patient shows me a small wound on her left fourth metatarsal head on her phone. She developed rapid discoloration in the plantar aspect of the left foot and she was admitted to hospital from 2/2 through 05/10/17 with wet gangrene of the left foot osteomyelitis of the fourth metatarsal heads. She was admitted acutely ill with a temperature of 103. She was started on broad-spectrum vancomycin and cefepime. On 05/06/17 she was taken to the OR by [name] her podiatric surgeon for an incision and drainage irrigation of the left foot wound. Cultures from this surgery revealed group be strep and anaerobes. she was seen by Dr.Xu of orthopedic surgery and scheduled for a below-knee amputation which she u refused. Ultimately she was discharged on Levaquin and Flagyl for one month. MRI 05/05/17 done while she was in the hospital showed abscess adjacent to the fourth metatarsal head and neck small abscess around the fourth flexor tendon. Inflammatory phlegmon and gas in the soft tissues along the lateral aspect of the fourth phalanx. Findings worrisome for  osteomyelitis involving the fourth proximal and middle phalanx and also the third and fourth metatarsals. Finally the patient had actually shortly before this followed up with Dr. Raul Del at no time on 04/29/17. He felt that her left femoropopliteal bypass was patent he felt that her left-sided toe pressures more than adequate for healing a wound on the left foot. This was before her acute presentation. 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 05/28/17; she is started hyperbarics. The patient tells me that for some reason she was not actually on Levaquin but I think on ciprofloxacin. She was on Flagyl. She only started her Levaquin yesterday due to some difficulty with the pharmacy and perhaps her sister picking it up. She has an appointment with [name] tomorrow and with infectious disease early next week. She has no new complaints 06/06/17; the patient continues in hyperbarics. She saw [name] on 05/29/17 who is her podiatric surgeon. He is elected for a transmetatarsal amputation on 06/27/17. I'm not sure at what level he plans to do this amputation. The patient is unaware ooShe also saw Dr. Megan Salon of infectious disease who elected to continue her on current antibiotics I think this is ciprofloxacin and Flagyl. I'll need to clarify with her tomorrow if she actually has this. We're using silver alginate to the actual wound. Necrotic surface today with material under the flap of her foot. ooOriginal MRI showed abscesses as well as osteomyelitis of the proximal and middle fourth phalanx and the third and fourth metatarsal heads 06/11/17; patient continues in  hyperbarics and continues on oral antibiotics. She is doing well. The wound looks better. The necrotic part of this under the flap in her superior foot also looks better. she is been to see [name]. I haven't had a chance to look at his note. Apparently he has put the transmetatarsal amputation on hold her request it is still planning to  take her to the OR for debridement and product application ACEL. I'll see if I can find his note. I'll therefore leave product ordering/requests to [name] for now. I was going to look at Dermagraft 06/18/17-she is here in follow-up evaluation for bilateral foot wounds. She continues with hyperbaric therapy. She states she has been applying manuka honey to the right plantar foot and alternate manuka honey and silver alginate to the left foot, despite our orders. We will continue with same treatment plan and she will follo up next week. 06/25/17; I have reviewed [name] last note from 3/11. She has operative debridement in 2 days' time. By review his note apparently they're going to place there is skin over the majority of this wound which is a good choice. She has a small satellite area at the most proximal part of this wound on the left plantar foot. The area on the right plantar foot we've been using silver alginate and it is close to healing. 07/02/17; unfortunately the patient was not easily approved for [name] proposed surgery. I'm not completely certain what the issue is. She has been using silver alginate to the wound she has completed a first course of hyperbarics. She is still on Levaquin and Flagyl. I have really lost track of the time course here.I suspect she should have another week to 2 of antibiotics. I'll need to see if she is followed up with infectious disease Dr. Megan Salon 07/09/17; the patient is followed up with Dr. Megan Salon. 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 She continues on Levaquin and metronidazole continuing both of these for now I think she is been on fr about 6 weeks. She still has some drainage but no pain. No fever. Her had been plans for her to go to the OR for operative debridement with her podiatrist [name], I am not exactly sure where that is. I'll probably slip a note to [name] today. I note that she follows  with Dr. Dwyane Dee of endocrinology. We have her recertified for hyperbaric oxygen. I have not heard about Dermagraft however I'll see if [name] is planning a skin substitute as well 07/16/17; the patient tells me she is just about out of Crouch. I'll need to check Dr. Hale Bogus last notes on this. She states she has plenty of Flagyl however. She comes in today complaining of pain in the right lateral foot which she said lasted for about a day. The wound on the right foot is actually much more medially. She also tells me that the [facility] cost a lot of pain in the left foot wound and she turned back to silver alginate. Finally Dermagraft has a $161 per application co-pay. She cannot afford this 07/23/17; patient arrives today with the wound not much smaller. There is not much new to add. She has not heard from [name] all try to put in a call to them today. She was asking about Dermagraft again and she has an over $096 per application co-pay she states that she would be willing to try to do  a payment plan. I been tried to avoid this. We've been using silver alginate, I'll change to [facility] 07/30/17-She is here in follow-up evaluation for left foot ulcer. She continues hyperbaric medicine. The left foot ulcer is stable we will continue with same treatment plan 08/06/17; she is here for evaluation of her left foot ulcer. Currently being treated for hyperbarics or underlying osteomyelitis. She is completed antibiotics. The left foot ulcer is better smaller with healthier looking granulation. For various reasons I am not really clear on we never got her back to the OR with [name]. He did not respond to my secure text message. Nevertheless I think that surgery on this point is not necessary nor am I completely clear that a skin substitute is necessary The patient is complaining about pain on the outside of her right foot. 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 There is no  erythema. She also states the foot is warm versus her other part of her upper leg and this is largely true. It is not totally clear to me what's causing this. She thinks it's different from her usual neuropathy pain 08/13/17; she arrives in clinic today with a small wound which is superficial on her right first metatarsal head. 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 She tells [place] she was up on her feet over the Mother's Day celebration. ooThe large wound is on the left foot. Continues with hyperbarics for underlying osteomyelitis. We're using Hydrofera Blue. She asked me today about where we were with Dermagraft. I had actually excluded this because of the co-pay however she wants to assume this therefore I'll recheck the co-pay an order for next week. 08/20/17; the patient agreed to accept the co-pay of the first Apligraf which we applied today. She is disappointed she is finishing hyperbarics will run this through the insurance on the extent of the foot infection and the extent of the wound that she had however she is already had 60 dive's. Dermagraft No. 1 08/27/17; Dermagraft No. 2. She is not eligible for any more hyperbaric treatments this month. She reports a fair amount of drainage and she actually changed to the external dressings without disturbing the direct contact layer 09/03/17; the patient arrived in clinic today with the wound superficially looking quite healthy. Nice vibrant red tissue with some advancing epithelialization although not as much adherence of the flap as I might like. However she noted on her own fourth toe some bogginess and she brought that to our attention. Indeed this was boggy feeling like a possibility of subcutaneous fluid. 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 She is not been unwell. We've been using Dermagraft 09/10/17; the culture that I did not last week was MRSA.  She saw Dr. Megan Salon this morning who is going to start her on vancomycin. I had sent him a secure a text message yesterday. I also spoke with her podiatric surgeon [name] about surgery on this foot the options for conserving a functional foot etc. Promised me he would see her and will make back consultation today. Paradoxically her actual wound on the plantar aspect of her left foot looks really quite good. I had given her 5 days worth of Baxdella to cover her for MRSA. Her MRI came back showing osteomyelitis within the third metatarsal shaft and head and base of the third and fourth proximal phalanx. She had extensive  inflammatory changes throughout the soft tissue of the lateral forefoot. With an ill-defined fluid around the fourth metatarsal extending into the plantar and dorsal soft tissues 09/19/17; the patient is actually on oral Septra and Flagyl. She apparently refused IV vancomycin. She also saw [name] at my request who is planning her for a left BKA sometime in mid July. MRI showed osteomyelitis within the third metatarsal shaft and head and the basis of the third and fourth proximal phalanx. I believe there was felt to be possible septic arthritis involving the third MTP. 09/26/17; the patient went back to Dr. Megan Salon at my suggestion and is now receiving IV daptomycin. Her wound continues to look quite good making the decision to proceed with a transmetatarsal amputation although more difficult for the patient. I believe in my extensive discussions with her she has a good sense of the pros and cons of this. I don't NV the tuft decision she has to make. She has an appointment with [name] I believe in mid July and I previously spoken to him about this issue Has we had used 3 previous Dermagraft. Given the condition of the wound surface I went ahead and added the fourth one today area and I did this not fully realizing that she'll be traveling to [place] next week. I'm hopeful she  can come back in 2 weeks 10/21/17; Her same Dermagraft on for about 3-1/2 weeks. In spite of this the wound arrives looking quite healthy. There is been a lot of healing dimensions are smaller. Looking at the square shaped wound she has now there is some undermining and some depth medially under the undermining although I cannot palpate any bone. No surrounding infection is obvious. She has difficult questions about how to look at this going forward vis--vis amputations versus continued medical therapy. T be truthful the wound is looks so o healthy and it is continued to contract. Hard to justify foot surgery at this point although I still told her that I think it might come to that if we are not able to eradicate the underlying MRSA. She is still highly at risk and she understands this 11/06/17 on evaluation today patient appears to be doing better in regard to her foot ulcer. She's been tolerating the dressing changes without complication. Currently she is here for her Dermagraft #6. Her wound continues to make excellent progress at this point. She does not appear to have any evidence of infection which is good news. 11/13/17 on evaluation today patient appears to be doing excellent at this time. She is here for repeat Dermagraft application. This is #7. Overall her wound seems to be making great progress. 12/05/17; the patient arrives with the wound in much better condition than when I last saw this almost 6 weeks ago. She still has a small probing area in the left metatarsal head region on the lateral aspect of her foot. We applied her last Dermagraft today. ooSince the last time she is here she has what appears to a been a blood blister on the plantar aspect of left foot although I don't see this is threatening. There is also a thick raised tissue on the right mid metatarsal head region. This was not there I don't think the last time she was here 3 weeks ago. 12/12/17; the patient continues to have  a small programming area in the left metatarsal head region on the lateral aspect of her foot which was the initial large surgical wound. I applied her last  Apligraf last week. I'm going to use Endoform starting today ooUnfortunately she has an excoriated area in the left mid foot and the right mid foot. The left midfoot looks like a blistered area this was not opened last week it certainly is open today. Using silver alginate on these areas. She promises me she is offloading this. 12/19/17; the small probing area in the left metatarsal head eyes think is shallower. In general her original wound looks better. We've been using Endoform. The area inferiorly that I think was trauma last week still requires debridement a lot of nonviable surface which I removed. She still has an open open area distally in her foot ooSimilarly on the right foot there is tightly adherent surface debris which I removed. Still areas that don't look completely epithelialized. This is a small open area. We used silver alginate on these areas 12/26/2017; the patient did not have the supplies we ordered from last week including the Endoform. The original large wound on the left lateral foot looks healthy. She still has the undermining area that is largely unchanged from last week. She has the same heavily callused raised edged wounds on the right mid and left midfoot. Both of these requiring debridement. We have been using silver alginate on these areas 01/02/2018; there is still supply issues. We are going to try to use Prisma but I am not sure she actually got it from what she is saying. She has a new open area on the lateral aspect of the left fourth toe [previous fifth ray amputation]. Still the one tunneling area over the fourth metatarsal head. The area is in the midfoot bilaterally still have thick callus around them. She is concerned about a raised swelling on the lateral aspect of the foot. However she is  completely insensate 01/10/2018; we are using Prisma to the wounds on her bilateral feet. Surprisingly the tunneling area over the left fourth metatarsal head that was part of her original surgery has closed down. She has a small open area remaining on the incision line. 2 open areas in the midfoot. 02/10/2018; the patient arrives back in clinic after a month hiatus. She was traveling to visit family in [place]. Is fairly clear she was not offloading the areas on her feet. The original wound over the left lateral foot at the level of metatarsal heads is reopened and probes medially by about a centimeter or 2. She notes that a week ago she had purulent drainage come out of an area on the left midfoot. Paradoxically the worst area is actually on the right foot is extensive with purulent drainage. We will use silver alginate today 02/17/2018; the patient has 3 wounds one over the left lateral foot. She still has a small area over the metatarsal heads which is the remnant of her original surgical wound. This has medial probing depth of roughly 1.4 cm somewhat better than last week. The area on the right foot is larger. We have been using silver alginate to all areas. The area on the right foot and left foot that we cultured last week showed both Klebsiella and Proteus. Both of these are quinolone sensitive. The patient put her's self on Bactrim and Flagyl that she had left hanging around from prior antibiotic usages. She was apparently on this last week when she arrived. I did not realize this. Unfortunately the Bactrim will not cover either 1 of these organisms. We will send in Cipro 500 twice daily for a week 03/04/2018; the patient  has 2 wounds on the left foot one is the original wound which was a surgical wound for a deep DFU. At one point this had exposed bone. She still has an area over the fourth metatarsal head that probes about 1.4 cm although I think this is better than last week. I been using  silver nitrate to try and promote tissue adherence and been using silver alginate here. ooShe also has an area in the left midfoot. This has some depth but a small linear wound. Still requiring debridement. ooOn the right midfoot is a circular wound. A lot of thick callus around this area. ooWe have been using silver alginate to all wound areas ooShe is completed the ciprofloxacin I gave her 2 weeks ago. 03/11/2018; the patient continues to have 2 open areas on the left foot 1 of which was the original surgical wound for a deep DFU. Only a small probing area remains although this is not much different from last week we have been using silver alginate. The other area is on the midfoot this is smaller linear but still with some depth. We have been using silver alginate here as well ooOn the right foot she has a small circular wound in the mid aspect. This is not much smaller than last time. We have been using silver alginate here as well 03/18/2018; she has 3 wounds on the left foot the original surgical wound, a very superficial wound in the mid aspect and then finally the area in the mid plantar foot. She arrives in today with a very concerning area in the wound in the mid plantar foot which is her most proximal wound. There is undermining here of roughly 1-1/2 cm superiorly. Serosanguineous drainage. She tells me she had some pain on for over the weekend that shot up her foot into her thigh and she tells me that she had a nodule in the groin area. ooShe has the single wound in the right foot. ooWe are using endoform to both wound areas 03/24/2018; the patient arrives with the original surgical wound in the area on the left midfoot about the same as last week. There is a collection of fluid under the surface of the skin extending from the surgical wound towards the midfoot although it does not reach the midfoot wound. The area on the right foot is about the same. Cultures from last week of  the left midfoot wound showed abundant Klebsiella abundant Enterococcus faecalis and moderate methicillin resistant staph I gave her Levaquin but this would have only covered the Klebsiella. She will need linezolid 04/01/2018; she is taking linezolid but for the first few days only took 1 a day. I have advised her to finish this at twice daily dosing. In any case all of her wounds are a lot better especially on the left foot. The original surgical wound is closed. The area on the left midfoot considerably smaller. The area on the right foot also smaller. 04/08/2018; her original surgical wound/osteomyelitis on the left foot remains closed. She has area on the left foot that is in the midfoot area but she had some streaking towards this. This is not connected with her original wound at least not visually. ooSmall wound on the right midfoot appears somewhat smaller. 04/15/18; both wounds looks better. Original wound is better left midfoot. Using silver alginate 1/21; patient states she uses saltwater soak in, stones or remove callus from around her wounds. She is also concerned about a blood blister she had on  the left foot but it simply resolved on its own. We've been using silver alginate 1/28; the patient arrives today with the same streaking area from her metatarsals laterally [the site of her original surgical wound] down to the middle of her foot. There is some drainage in the subcutaneous area here. This concerns me that there is actually continued ongoing infection in the metatarsals probably the fourth and third. This fixates an MRI of the foot without contrast [chronic renal failure] ooThe wound in the mid part of the foot is small but I wonder whether this area actually connects with the more distal foot. ooThe area on the right midfoot is probably about the same. Callus thick skin around the small wound which I removed with a curette we have been using silver alginate on both wound  areas 2/4; culture I did of the draining site on the left foot last time grew methicillin sensitive staph aureus. MRI of the left foot showed interval resolution of the findings surrounding the third metatarsal joint on the prior study consistent with treated osteomyelitis. Chronic soft tissue ulceration in the plantar and lateral aspect of the forefoot without residual focal fluid collection. No evidence of recurrent osteomyelitis. 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 She tells me she had nausea and vomiting over the weekend and missed 2 days. In general her foot looks much the same. She has a small open area just below the left fourth metatarsal head. A linear area in the left midfoot. Some discoloration extending from the inferior part of this into the left lateral foot although this appears to be superficial. She has a small area on the right midfoot which generally looks smaller after debridement 2/18; the patient is completing his cephalexin and has another 2 days. She continues to have open areas on the left and right foot. 2/25; she is now off antibiotics. The area on the left foot at the site of her original surgical wound has closed yet again. She still has open areas in the mid part of her foot however these appear smaller. The area on the right mid foot looks about the same. We have been using silver alginate She tells me she had a serious hypoglycemic spell at home. She had to have EMS called and get IV dextrose 3/3; disappointing on the left lateral foot large area of necrotic tissue surrounding the linear area. This appears to track up towards the same original surgical wound. Required extensive debridement. The area on the right plantar  foot is not a lot better also using silver 3/12; the culture I did last time showed abundant enterococcus. I have prescribed Augmentin, should cover any unrecognized anaerobes as well. In addition there were a few MRSA and Serratia that would not be well covered although I did not want to give her multiple antibiotics. She comes in today with a new wound in the right midfoot this is not connected with the original wound over her MTP a lot of thick callus tissue around both wounds but once again she said she is not walking on these areas 3/17-Patient comes in for follow-up on the bilateral plantar wounds, the right midfoot and the left plantar wound. Both these are heavily callused surrounding the wounds. We are  continuing to use silver alginate, she is compliant with offloading and states she uses a wheelchair fairly often at home 3/24; both wound areas have thick callus. However things actually look quite a bit better here for the majority of her left foot and the right foot. 3/31; patient continues to have thick callused somewhat irritated looking tissue around the wounds which individually are fairly superficial. There is no evidence of surrounding infection. We have been using silver alginate however I change that to [facility] today 4/17; patient returns to clinic after having a scare with Covid she tested negative in her primary doctor's office. She has been using Hydrofera Blue. She does not have an open area on the right foot. On the left foot she has a small open area with the mid area not completely viable. She showed me pictures of what looks like a hemorrhagic blister from several days ago but that seems to have healed over this was on the lateral left foot 4/21; patient comes in to clinic with both her wounds on her feet closed. However over the weekend she started having pain in her right foot and leg up into the thigh. She felt as though she was running a low-grade fever but did not  take her temperature. She took a doxycycline that she had leftover and yesterday a single Septra and metronidazole. She thinks things feel somewhat better. 4/28; duplex ultrasound I ordered last week was negative for DVT or superficial thrombophlebitis. She is completed the doxycycline I gave her. States she is still having a lot of pain in the right calf and right ankle which is no better than last week. She cannot sleep. She also states she has a temperature of up to 101, coughing and complaining of visual loss in her bilateral eyes. Apparently she was tested for Covid 2 weeks ago at [facility] and that was negative. Readmission: 09/03/18 patient presents back for reevaluation after having been evaluated at the end of April regarding erythema and swelling of her right lower extremity. Subsequently she ended up going to the hospital on 07/29/18 and was admitted not to be discharged until 08/08/18. Unfortunately it was noted during the time that she was in the hospital that she did have methicillin-resistant Staphylococcus aureus as the infection noted at the site. It was also determined that she did have osteomyelitis which appears to be fairly significant. She was treated with vancomycin and in fact is still on IV vancomycin at dialysis currently. This is actually slated to continue until 09/12/18 at least which will be the completion of the six weeks of therapy. Nonetheless based on what I'm seeing at this point I'm not sure she will be anywhere near ready to discontinue antibiotics at that time. Since she was released from the hospital she was seen by [name] who is her podiatrist on 08/27/18. His note specifically states that he is recommended that the patient needs of one knee amputation on the right as she has a life- threatening situation that can lead quickly to sepsis. The patient advised she would like to try to save her leg to which [name] apparently told her that this was against all medical  advice. She also want to discontinue the Wound VAC which had been initiated due to the fact that she wasn't pleased with how the wound was looking and subsequently she wanted to pursue applying Medihoney at that time. He stated that he did not believe that the right lower extremity was salvageable and  that the patient understood but would still like to attempt hyperbaric option therapy if it could be of any benefit. She was therefore referred back to [place] for further evaluation. He plans to see her back next week. Upon inspection today patient has a significant amount purulent drainage noted from the wound at this point. The bone in the distal portion of her foot also appears to be extremely necrotic and spongy. When I push down on the bone it bubbles and seeps purulent drainage from deeper in the end of the foot. I do not think that this is likely going to heal very well at all and less aggressive surgical debridement were undertaken more than what I believe we can likely do here in our office. 09/12/2018; I have not seen this patient since the most recent hospitalization although she was in our clinic last week. I have reviewed some of her records from a complex hospitalization. 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 There is situation was complicated by MRSA bacteremia and acute on chronic renal failure now on dialysis. She is receiving vancomycin at dialysis. We started her on Dakin's wet-to-dry last week she is changing this daily. There is still purulent drainage coming out of her foot. Although she is apparently "agreeable" to a below-knee amputation which is been suggested by multiple clinicians she wants this to be done in [place]. She apparently has a telehealth visit with that provider sometime in late [place] 6/24. I have told her I think this is probably too long. Nevertheless I could not convince her to allow a local doctor to perform  BKA. 09/19/2018; the patient has a large necrotic area on the right anterior foot. She has had previous transmetatarsal amputations. Culture I did last week showed MRSA nothing else she is on vancomycin at dialysis. She has continued leaking purulent drainage out of the distal part of the large circular wound on the right anterior foot. She apparently went to see Dr. 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 Somehow that translated into her being referred to plastic surgery for debridement of the area. I gather she basically refused amputation although I do not have a copy of Dr. Berenice Primas notes. The patient really wants to have a trial of hyperbaric oxygen. I agreed with initial assessment in this clinic that this was probably too far along to benefit however if she is going to have plastic surgery I think she would benefit from ancillary hyperbaric oxygen. The issue here is that the patient has benefited as maximally as any patient I have ever seen from hyperbaric oxygen therapy. Most recently she had exposed bone on the lateral part of her left foot after a surgical procedure and that actually has closed. She has eschared areas in both heels but no open area. She is remained systemically well. I am not optimistic that anything can be done about this but the patient is very clear that she wants an attempt. The attempt would include a wound VAC further debridements and hyperbaric oxygen along with IV antibiotics. 6/26; I put her in for a trial of hyperbaric oxygen only because of the dramatic response she has had with wounds on her left midfoot earlier this year which was a surgical wound that went straight to her bone over the metatarsal heads and also remotely the left third toe. We will see if we can get this through our review process and insurance. She arrives in  clinic with again purulent material pouring out of necrotic bone on the top of the foot distally. There is  also some concerning erythema on the front of the leg that we marked. It is bit difficult to tell how tender this is because of neuropathy. I note from infectious disease that she had her vancomycin extended. 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 She had the wound VAC on for part of the week. The rest of the time she is putting various things on this including Medihoney, "ionized water" silver sorb gel etc. 7/7; follow-up along with HBO. She is still on vancomycin at dialysis. She has a large open area on the dorsal right foot and a small dark eschar area on her heel. There is a lot less erythema in the area and a lot less tenderness. From an infection point of view I think this is better. She still has a lot of necrosis in the remaining right forefoot [previous TMA] we are still using the wound VAC in this area 7/16; follow-up along with HBO. I put her on linezolid after she finished her vancomycin. We started this last Friday I gave her 2 weeks worth. I had the expectation that she would be operatively debrided by Dr. Marla Roe but that still has not happened yet. Patient phoned the office this week. She arrives for review today after HBO. The distal part of this wound is completely necrotic. Nonviable pieces of tendon bone was still purulent drainage. Also concerning that she has black eschar over the heel that is expanding. I think this may be indicative of infection in this area as well. She has less erythema and warmth in the ankle and calf but still an abnormal exam 7/21 follow-up along with HBO. I will renew her linezolid after checking a CBC with differential monitoring her blood counts especially her platelets. She was supposed to have surgery yesterday but if I am reading things correctly this was canceled after her blood sugar was found to be over 500. I thought Dr. Marla Roe who called me said that they were sending her to the ER but the patient states  that was not the case. 7/28. Follow-up along with HBO. She is on linezolid I still do not have any lab work from dialysis even though I called last week. The patient is concerned about an area on her left lateral foot about the level of the base of her fifth metatarsal. I did not really see anything that ominous here however this patient is in [place] ability to point out problems that she is sensing and she has been accurate in the past Finally she received a call from Dr. Marla Roe who is referring her to another orthopedic surgeon stating that she is too booked up to take her to the operating room now. Was still using a wound VAC on the foot 8/3 -Follow-up after HBO, she is got another week of linezolid, she is to call [facility] for an appointment, x-rays of both feet were reviewed, the left foot x-ray with third MTP joint osteo- Right foot x-ray widespread osteo-in the right midfoot Right ankle x-ray does not show any active evidence of infection 8/11-Patient is seen after HBO, the wounds on the right foot appear to be about the same, the heel wound had some necrotic base over tendon that was debrided with a curette 8/21; patient is seen after HBO. The patient's wound on her dorsal foot actually looks reasonably good and there is  substantial amount of epithelialization however the open area distally still has a lot of necrotic debris partially bone. I cannot really get a good sense of just how deep this probes under the foot. She has been pressuring me this week to order medical maggots through a company in [place] for her. The problem I have is there is not a defined wound area here. On the positive side there is no purulence. She has been to see infectious disease she is still on Septra DS although I have not had a chance to review their notes 8/28; patient is seen in conjunction with HBO. The wounds on her foot continued to improve including the right dorsal foot substantially the, the distal  part of this wound and the area on the right heel. We have been using a wound VAC over this chronically. She is still on trimethoprim as directed by infectious disease 9/4; patient is seen in conjunction with HBO. Right dorsal foot wound substantially anteriorly is better however she continues to have a deep wound in the distal part of this that is not responding. We have been using silver collagen under border foam ooArea on the right plantar medial heel seems better. We have been using Hydrofera Blue 12/12/18 on evaluation today patient appears to be doing about the same with regard to her wound based on prior measurements. She does have some necrotic tissue noted on the lateral aspect of the wound that is going require a little bit of sharp debridement today. This includes what appears to be potentially either severely necrotic bone or tendon. Nonetheless other than that she does not appear to have any severe infection which is good news 9/18; it is been 2 weeks since I saw this wound. She is tolerating HBO well. Continued dramatic improvement in the area on the right dorsal foot. She still has a small wound on the heel that we have been using Hydrofera Blue. She continues with a wound VAC 9/24; patient has to be seen emergently today with a swelling on her right lateral lower leg. She says that she told Dr. Evette Doffing about this and also myself on a couple of occasions but I really have no recollection of this. She is not systemically unwell and her wound really looked good the last time I saw this. She showed this to providers at dialysis and she was able to verify that she was started on cephalexin today for 5 doses at dialysis. She dialyzes on Tuesday Thursday and Saturday. 10/2; patient is seen in conjunction with HBO. The area that is draining on the right anterior medial tibia is more extensive. Copious amounts of serosanguineous drainage with some purulence. We are still using the wound VAC on  the original wound then it is stable. Culture I did of the original IandD showed MRSA I contacted dialysis she is now on vancomycin with dialysis treatments. I asked them to run a month 10/9; patient seen in conjunction with HBO. She had a new spontaneous open area just above the wound on the right medial tibia ankle. More swelling on the right medial tibia. Her wound on the foot looks about the same perhaps slightly better. There is no warmth spreading up her leg but no obvious erythema. her MRI of the foot and ankle and distal tib-fib is not booked for next Friday I discussed this with her in great detail over multiple days. it is likely she has spreading infection upper leg at least involving the distal 25% above the  ankle. She knows that if I refer her to orthopedics for infectious disease they are going to recommend amputation and indeed I am not against this myself. We had a good trial at trying to heal the foot which is what she wanted along with antibiotics debridement and HBO however she clearly has spreading infection [probably staph aureus/MRSA]. Nevertheless she once again tells me she wants to wait the left of the MRI. She still makes comments about having her amputation done in [place]. 10/19; arrives today with significant swelling on the lateral right leg. Last culture I did showed Klebsiella. Multidrug-resistant. Cipro was intermediate sensitivity and that is what I have her on pending her MRI which apparently is going to be done on Thursday this week although this seems to be moving back and forth. She is not systemically unwell. We are using silver alginate on her major wound area on the right medial foot and the draining areas on the right lateral lower leg 10/26; MRI showed extensive abscess in the anterior compartment of the right leg also widespread osteomyelitis involving osseous structures of the midfoot and portions of the hindfoot. Also suspicion for osteomyelitis  anterior aspect of the distal medial malleolus. Culture I did of the purulence once again showed a multidrug-resistant Klebsiella. I have been in contact with nephrology late last week and she has been started on cefepime at dialysis to replace the vancomycin We sent a copy of her MRI report to Dr. Geroge Baseman in [place] who is an orthopedic surgeon. The patient takes great stock in his opinion on this. She says she will go to [place] to have her leg amputated if Dr. Geroge Baseman does not feel there is any salvage options. 11/2; she still is not talk to her orthopedic surgeon in [place]. Apparently he will call her at [phone] this afternoon. The quality of this is she has not allowed me to refer her anywhere. She has been told over and over that she needs this amputated but has not agreed to be referred. She tells me her blood sugar was 600 last night but she has not been febrile. 11/9; she never did got a call from the orthopedic surgeon in [place] therefore that is off the radar. We have arranged to get her see orthopedic surgery at [facility]. She still has a lot of draining purulence coming out of the new abscess in her right leg although that probably came from the osteomyelitis in her right foot and heel. Meanwhile the original wound on the right foot looks very healthy. Continued improvement. The issue is that the last MRI showed osteomyelitis in her right foot extensively she now has an abscess in the right anterior lower leg. There is nobody in [place] who will offer this woman anything but an amputation and to be honest that is probably what she needs. I think she still wants to talk about limb salvage although at this point I just do not see that. She has completed her vancomycin at dialysis which was for the original staph aureus she is still on cefepime for the more recent Klebsiella. She has had a long course of both of these antibiotics which should have benefited the osteomyelitis on  the right foot as well as the abscess. 11/16; apparently [place] elective surgery is shut down because of COVID-19 pandemic. I have reached out to some contacts at [facility] to see if we can get her an orthopedic appointment there. I am concerned about continually leaving this but for the moment everything  is static. In fact her original large wound on this foot is closing down. It is the abscess on the right anterior leg that continues to drain purulent serosanguineous material. She is not currently on any antibiotics however she had a prolonged course of vancomycin [1 month] as well as cefepime for a month 02/24/2019 on evaluation today patient appears to be doing better than the last time I saw her. This is not a patient that I typically see. With that being said I am covering for Dr. Dellia Nims this week and again compared to when I last saw her overall the wounds in particular seem to be doing significantly better which is good news. With that being said the patient tells me several disconcerting things. She has not been able to get in to see anyone for potential debridement in regard to her leg wounds although she tells me that she does not think it is necessary any longer because she is taking care of that herself. She noticed a string coming out of the lower wound on her leg over the last week. The patient states that she subsequently decided that we must of pack something in there and started pulling the string out and as it kept coming and coming she realized this was likely her tendon. With that being said she continued to remove as much of this as she could. She then I subsequently proceeded to using tubes of antibiotic ointment which she will stick down into the wound and then scored as much as she can until she sees it coming out of the other wound opening. She states that in doing this she is actually made things better and there is less redness and irritation. With regard to her foot  wound she does have some necrotic tendon and tissue noted in one small corner but again the actual wound itself seems to be doing better with good granulation in general compared to my last evaluation. 12/7; continued improvement in the wound on the substantial part of the right medial foot. Still a necrotic area inferiorly that required debridement but the rest of this looks very healthy and is contracting. She has 2 wounds on the right lateral leg which were her original drainage sites from her abscess but all of this looks a lot better as well. She has been using silver alginate after putting antibiotic biotic ointment in one wound and watching it come out the other. I have talked to her in some detail today. I had given her names of orthopedic surgeons at [facility] for second opinion on what to do about the right leg. I do not think the patient never called them. She has not been able to get a hold of the orthopedic surgeon in [place] that she had put a lot of faith in as being somebody would give her an opinion that she would trust. I talked to her today and said even if I could get her in to another orthopedic surgeon about the leg which she accept an amputation and she said she would not therefore I am not going to press this issue for the moment 12/14; continued improvement in his substantial wound on the right medial foot. There is still a necrotic area inferiorly with tightly adherent necrotic debris which I have been working on debriding each time she is here. She does not have an orthopedic appointment. Since last time she was here I looked over her cultures which were essentially MRSA on the foot wound and gram-negative  rods in the abscess on the anterior leg. 12/21; continued improvement in the area on the right medial foot. She is not up on this much and that is probably a good thing since I do not know it could support continuous ambulation. She has a small area on the right lateral  leg which were remanence of the IandD's I did because of the abscess. I think she should probably have prophylactic antibiotics I am going to have to look this over to see if we can make an intelligent decision here. In the meantime her major wound is come down nicely. Necrotic area inferiorly is still there but looks a lot better 04/06/2019; she has had some improvement in the overall surface area on the right medial foot somewhat narrowedr both but somewhat longer. The areas on the right lateral leg which were initial IandD sites are superficial. Nothing is present on the right heel. We are using silver alginate to the wound areas 1/18; right medial foot somewhat smaller. Still a deep probing area in the most distal recess of the wound. She has nothing open on the right leg. She has a new wound on the plantar aspect of her left fourth toe which may have come from just pulling skin. The patient using Medihoney on the wound on her foot under silver alginate. I cannot discourage her from this 2/1; 2-week follow-up using silver alginate on the right foot and her left fourth toe. The area on the right dorsal foot is contracted although there is still the deep area in the most distal part of the wound but still has some probing depth. No overt infection 2/15; 2-week follow-up. She continues to have improvement in the surface area on the dorsal right foot. Even the tunneling area from last time is almost closed. The area that was on the plantar part of her left fourth toe over the PIP is indeed closed 3/1; 2-week follow-up. Continued improvement in surface area. The original divot that we have been debriding inferiorly I think has full epithelialization although the epithelialization is gone down into the wound with probably 4 mm of depth. Even under intense illumination I am unable to see anything open here. The remanence of the wound in this area actually look quite healthy. We have been using silver  alginate 3/15; 2-week follow-up. Unfortunately not as good today. She has a comma shaped wound on the dorsal foot however the upper part of this is larger. Under illumination debris on the surface She also tells [place] that she was on her right leg 2 times in the last couple of weeks mostly to reach up for things above her head etc. She felt a sharp pain in the right leg which she thinks is somewhere from the ankle to the knee. The patient has neuropathy and is really uncertain. She cannot feel her foot so she does not think it was coming from there 3/29; 2-week follow-up. Her wound measures smaller. Surface of the wound appears reasonable. She is using silver alginate with underlying Medihoney. She has home health. X-rays I did of her tib-fib last time were negative although it did show arterial calcification 4/12; 2-week follow-up. Her wound measures smaller in length. Using manuka honey with silver alginate on top. She has home health. 4/26; 2-week follow-up. Her wound is smaller but still very adherent debris under illumination requiring debridement she has been using manuka honey with silver alginate. 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 Been using Hydrofera Blue. She is on suppressive Bactrim but apparently she has been taking it twice daily 6/7; I have not seen this wound and about 6 weeks. Since then she was up in [place]. By her own admission she was walking on the foot because she did not have a wheelchair. The wound is not nearly as healthy looking as it was the last time I saw this. We ordered different things for her but she only uses Medihoney and silver alginate. As far as I know she is on suppressive trimethoprim sulfamethoxazole. She does not admit to any fever or chills. Her CBGs apparently are at baseline however she is saying that she feels some discomfort on the lateral  part of her ankle I looked over her last inflammatory markers from the summer 2020 at which time she had a deeply necrotic infected wound in this area. On 11/10/2018 her sedimentation rate was 56 and C-reactive protein 9.9. This was 107 and 29 on 07/29/2018. 6/17; the patient had a necrotic wound the last time she was here on the right dorsal foot. After debridement I did a culture. This showed a very resistant ESBL Klebsiella as well as Enterococcus. Her x-ray of the foot which was done because of warmth and some discomfort showed bone destruction within the carpal bones involving the navicular acute cuboid lateral middle cuneiforms but essentially unchanged from her prior study which was done on 10/29/2018. The findings were felt to represent chronic osteomyelitis. We did inflammatory markers on her. Her white count was 5.25 sedimentation rate 16 and C-reactive protein at 11.1. Notable for the fact that in August 2020 her CRP was 9.9 and sedimentation rate 56. I have looked at her x-rays. It is true that the bone destruction is very impressive however the patient came into this clinic for the wound on her right foot with pieces of bone literally falling out anteriorly with purulent material. I am not exactly sure I could have expected anything different. She has not been systemically unwell no fever chills or blood sugars have been reasonable. 6/28; she arrives with a right heel closed. The substantial area on the right anterior foot looks healthy. Much better looking surface. I think we can change to [facility] seems to help this previously. She is getting her antibiotics at dialysis she should be just about finished that about now. Objective Constitutional Patient is hypertensive.. Pulse regular and within target range for patient.Marland Kitchen Respirations regular, non-labored and within target range.. Temperature is normal and within the target range for the patient.Marland Kitchen Appears in no distress. Vitals Time  Taken: 1:06 PM, Height: 67 in, Weight: 125 lbs, BMI: 19.6, Temperature: 98.4 F, Pulse: 84 bpm, Respiratory Rate: 18 breaths/min, Blood Pressure: 160/80 mmHg. Cardiovascular Dorsalis pedis and posterior tibial pulses are palpable. General Notes: Wound exam ooRight forefoot the wound looks better than the last time I saw this. Healthier looking tissue there is epithelialization coming from the inferior part no evidence of infection no surrounding erythema ooThe right heel has closed over which was new last time she was here Integumentary (Hair, Skin) Wound #43 status is Open. Original cause of wound was Gradually Appeared. The wound is located on the Right,Medial Foot. The wound measures 4.8cm length x 3cm width x 0.1cm depth; 11.31cm^2 area and 1.131cm^3 volume. There is muscle and Fat Layer (Subcutaneous Tissue) Exposed exposed. There is no tunneling or undermining noted. There is  a medium amount of serosanguineous drainage noted. The wound margin is flat and intact. There is small (1-33%) pink, pale granulation within the wound bed. There is a large (67-100%) amount of necrotic tissue within the wound bed including Adherent Slough. Wound #49 status is Open. Original cause of wound was Trauma. The wound is located on the Right Calcaneus. The wound measures 0cm length x 0cm width x 0cm depth; 0cm^2 area and 0cm^3 volume. There is no tunneling or undermining noted. There is a none present amount of drainage noted. The wound margin is distinct with the outline attached to the wound base. There is no granulation within the wound bed. There is no necrotic tissue within the wound bed. 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 Dressing Change Frequency: Wound #43 Right,Medial Foot: Change Dressing every other day. Wound Cleansing: Clean wound with Wound Cleanser - or normal saline Primary Wound Dressing: Wound #43 Right,Medial Foot: Hydrofera Blue - Classic Secondary Dressing: Wound #43 Right,Medial Foot: Kerlix/Rolled Gauze Dry Gauze Edema Control: Elevate legs to the level of the heart or above for 30 minutes daily and/or when sitting, a frequency of: - throughout the day Home Health: [facility] for wound care. - Interim #1 I am going to change the patient to [facility] which has had good success in this wound in the past 2. The infection she had when she came back from [place] a few weeks ago looks a lot better. She received cefepime at dialysis. 3. Continues to be no evidence of arterial issue Electronic Signature(s) Signed: 09/28/2019 5:46:39 PM By: Linton Ham MD Entered By: Linton Ham on 09/28/2019 13:32:25 -------------------------------------------------------------------------------- SuperBill Details Patient Name: Date of Service: [name] 09/28/2019 Medical Record Number: [id number] Patient Account Number: [id number] Date of Birth/Sex: Treating RN: [id number] (49 y.o. Nancy Fetter Primary Care Provider: [name], [name] Other Clinician: Referring Provider: Treating Provider/Extender: [name], [name] Weeks in Treatment: 55 Diagnosis Coding ICD-10 Codes Code Description [phone] Other chronic osteomyelitis, right ankle and foot L97.514 Non-pressure chronic ulcer of other part of right foot with necrosis of bone L97.411 Non-pressure chronic ulcer of right heel and midfoot limited to breakdown of skin E10.621 Type 1 diabetes mellitus with foot ulcer Facility Procedures CPT4 Code: [id number] Description: 99213 - WOUND CARE VISIT-LEV 3 EST PT Modifier: Quantity:  1 Physician Procedures : CPT4 Code Description Modifier [id number] - WC PHYS LEVEL 3 - EST PT ICD-10 Diagnosis Description M86.671 Other chronic osteomyelitis, right ankle and foot L97.514 Non-pressure chronic ulcer of other part of right foot with necrosis of bone L97.411  Non-pressure chronic ulcer of right heel and midfoot limited to breakdown of skin E10.621 Type 1 diabetes mellitus with foot ulcer Quantity: 1 Electronic Signature(s) Signed: 09/28/2019 5:46:39 PM By: Linton Ham MD Signed: 09/28/2019 6:01:25 PM By: [name] RN, BSN Entered By: [name] on 09/28/2019 17:12:06

## 2019-09-29 NOTE — Progress Notes (Signed)
NAHOMI, HEGNER ([id number]) Visit Report for 09/28/2019 Arrival Information Details Patient Name: Date of Service: [name] 09/28/2019 1:15 PM Medical Record Number: [id number] Patient Account Number: [id number] Date of Birth/Sex: Treating RN: 1970-10-31 (49 y.o. [name] Primary Care Miesha Bachmann: [name], [name] Other Clinician: Referring Acacia Latorre: Treating Yuki Purves/Extender: [name], [name] Weeks in Treatment: 39 Visit Information History Since Last Visit All ordered tests and consults were completed: No Patient Arrived: Wheel Chair Added or deleted any medications: No Arrival Time: 13:05 Any new allergies or adverse reactions: No Accompanied By: self Had a fall or experienced change in No Transfer Assistance: None activities of daily living that may affect Patient Identification Verified: Yes risk of falls: Secondary Verification Process Completed: Yes Signs or symptoms of abuse/neglect since last visito No Patient Requires Transmission-Based Precautions: No Hospitalized since last visit: No Patient Has Alerts: No Implantable device outside of the clinic excluding No cellular tissue based products placed in the center since last visit: Has Dressing in Place as Prescribed: Yes Pain Present Now: No Electronic Signature(s) Signed: 09/29/2019 5:22:48 PM By: Carlene Coria RN Entered By: Carlene Coria on 09/28/2019 13:06:15 -------------------------------------------------------------------------------- Clinic Level of Care Assessment Details Patient Name: Date of Service: [name] 09/28/2019 1:15 PM Medical Record Number: [id number] Patient Account Number: [id number] Date of Birth/Sex: Treating RN: [birth year] (49 y.o. Nancy Fetter Primary Care Jajuan Skoog: [name], [name] Other Clinician: Referring Brietta Manso: Treating Armarion Greek/Extender: [name], [name] Weeks in Treatment: 76 Clinic Level of Care  Assessment Items TOOL 4 Quantity Score X- 1 0 Use when only an EandM is performed on FOLLOW-UP visit ASSESSMENTS - Nursing Assessment / Reassessment X- 1 10 Reassessment of Co-morbidities (includes updates in patient status) X- 1 5 Reassessment of Adherence to Treatment Plan ASSESSMENTS - Wound and Skin A ssessment / Reassessment X - Simple Wound Assessment / Reassessment - one wound 1 5 []  - 0 Complex Wound Assessment / Reassessment - multiple wounds []  - 0 Dermatologic / Skin Assessment (not related to wound area) ASSESSMENTS - Focused Assessment []  - 0 Circumferential Edema Measurements - multi extremities []  - 0 Nutritional Assessment / Counseling / Intervention X- 1 5 Lower Extremity Assessment (monofilament, tuning fork, pulses) []  - 0 Peripheral Arterial Disease Assessment (using hand held doppler) ASSESSMENTS - Ostomy and/or Continence Assessment and Care []  - 0 Incontinence Assessment and Management []  - 0 Ostomy Care Assessment and Management (repouching, etc.) PROCESS - Coordination of Care X - Simple Patient / Family Education for ongoing care 1 15 []  - 0 Complex (extensive) Patient / Family Education for ongoing care X- 1 10 Staff obtains Programmer, systems, Records, T Results / Process Orders est X- 1 10 Staff telephones HHA, Nursing Homes / Clarify orders / etc []  - 0 Routine Transfer to another Facility (non-emergent condition) []  - 0 Routine Hospital Admission (non-emergent condition) []  - 0 New Admissions / Biomedical engineer / Ordering NPWT Apligraf, etc. , []  - 0 Emergency Hospital Admission (emergent condition) X- 1 10 Simple Discharge Coordination []  - 0 Complex (extensive) Discharge Coordination PROCESS - Special Needs []  - 0 Pediatric / Minor Patient Management []  - 0 Isolation Patient Management []  - 0 Hearing / Language / Visual special needs []  - 0 Assessment of Community assistance (transportation, D/C planning, etc.) []  -  0 Additional assistance / Altered mentation []  - 0 Support Surface(s) Assessment (bed, cushion, seat, etc.) INTERVENTIONS - Wound  Cleansing / Measurement X - Simple Wound Cleansing - one wound 1 5 []  - 0 Complex Wound Cleansing - multiple wounds X- 1 5 Wound Imaging (photographs - any number of wounds) []  - 0 Wound Tracing (instead of photographs) X- 1 5 Simple Wound Measurement - one wound []  - 0 Complex Wound Measurement - multiple wounds INTERVENTIONS - Wound Dressings []  - 0 Small Wound Dressing one or multiple wounds X- 1 15 Medium Wound Dressing one or multiple wounds []  - 0 Large Wound Dressing one or multiple wounds X- 1 5 Application of Medications - topical []  - 0 Application of Medications - injection INTERVENTIONS - Miscellaneous []  - 0 External ear exam []  - 0 Specimen Collection (cultures, biopsies, blood, body fluids, etc.) []  - 0 Specimen(s) / Culture(s) sent or taken to Lab for analysis []  - 0 Patient Transfer (multiple staff / Civil Service fast streamer / Similar devices) []  - 0 Simple Staple / Suture removal (25 or less) []  - 0 Complex Staple / Suture removal (26 or more) []  - 0 Hypo / Hyperglycemic Management (close monitor of Blood Glucose) []  - 0 Ankle / Brachial Index (ABI) - do not check if billed separately X- 1 5 Vital Signs Has the patient been seen at the hospital within the last three years: Yes Total Score: 110 Level Of Care: New/Established - Level 3 Electronic Signature(s) Signed: 09/28/2019 6:01:25 PM By: [name] RN, BSN Entered By: [name] on 09/28/2019 17:11:55 -------------------------------------------------------------------------------- Encounter Discharge Information Details Patient Name: Date of Service: [place] LLO [address] [name] 09/28/2019 1:15 PM Medical Record Number: [id number] Patient Account Number: [id number] Date of Birth/Sex: Treating RN: [birth year] (49 y.o. [name] Primary Care Eustacio Ellen: [name], [name]  Other Clinician: Referring Mylisa Brunson: Treating Lakyn Mantione/Extender: [name], [name] Weeks in Treatment: 36 Encounter Discharge Information Items Discharge Condition: Stable Ambulatory Status: Wheelchair Discharge Destination: Home Transportation: Other Accompanied By: self Schedule Follow-up Appointment: Yes Clinical Summary of Care: Patient Declined Notes SCAT Electronic Signature(s) Signed: 09/29/2019 5:33:24 PM By: Baruch Gouty RN, BSN Entered By: Baruch Gouty on 09/28/2019 13:41:56 -------------------------------------------------------------------------------- Lower Extremity Assessment Details Patient Name: Date of Service: [name] 09/28/2019 1:15 PM Medical Record Number: [id number] Patient Account Number: [id number] Date of Birth/Sex: Treating RN: 1971-03-07 (49 y.o. [name] Primary Care Daira Hine: [name], [name] Other Clinician: Referring Ilay Capshaw: Treating Emil Klassen/Extender: [name], [name] Weeks in Treatment: 55 Edema Assessment Assessed: [Left: No] [Right: No] Edema: [Left: Ye] [Right: s] Calf Left: Right: Point of Measurement: cm From Medial Instep cm 27 cm Ankle Left: Right: Point of Measurement: cm From Medial Instep cm 17.5 cm Electronic Signature(s) Signed: 09/29/2019 5:22:48 PM By: Carlene Coria RN Entered By: Carlene Coria on 09/28/2019 13:07:05 -------------------------------------------------------------------------------- Multi Wound Chart Details Patient Name: Date of Service: [name] 09/28/2019 1:15 PM Medical Record Number: [id number] Patient Account Number: [id number] Date of Birth/Sex: Treating RN: 02-15-1971 (49 y.o. Nancy Fetter Primary Care Noorah Giammona: [name], [name] Other Clinician: Referring Azarah Dacy: Treating Estelle Skibicki/Extender: [name], [name] Weeks in Treatment: 55 Vital Signs Height(in): 67 Pulse(bpm): 75 Weight(lbs): 125 Blood  Pressure(mmHg): 160/80 Body Mass Index(BMI): 20 Temperature(F): 98.4 Respiratory Rate(breaths/min): 18 Photos: [43:No Photos Right, Medial Foot] [49:No Photos Right Calcaneus] [N/A:N/A N/A] Wound Location: [43:Gradually Appeared] [49:Trauma] [N/A:N/A] Wounding Event: [43:Diabetic Wound/Ulcer of the Lower] [49:Diabetic Wound/Ulcer of the Lower] [N/A:N/A] Primary Etiology: [43:Extremity Cataracts, Chronic sinus] [49:Extremity  Cataracts, Chronic sinus] [N/A:N/A] Comorbid History: [43:problems/congestion, Anemia, Sleep Apnea, Deep Vein Thrombosis, Hypertension, Peripheral Arterial Disease, Type I Diabetes, Osteoarthritis, Osteomyelitis, Neuropathy, Seizure Disorder 08/04/2018] [49:problems/congestion, Anemia, Sleep  Apnea, Deep Vein Thrombosis, Hypertension, Peripheral Arterial Disease, Type I Diabetes, Osteoarthritis, Osteomyelitis, Neuropathy, Seizure Disorder 09/17/2019] [N/A:N/A] Date Acquired: [43:55] [49:1] [N/A:N/A] Weeks of Treatment: [43:Open] [49:Open] [N/A:N/A] Wound Status: [43:Yes] [49:No] [N/A:N/A] Clustered Wound: [43:4.8x3x0.1] [49:0x0x0] [N/A:N/A] Measurements L x W x D (cm) [43:11.31] [49:0] [N/A:N/A] A (cm) : rea [43:1.131] [49:0] [N/A:N/A] Volume (cm) : [43:84.40%] [49:100.00%] [N/A:N/A] % Reduction in A rea: [43:94.80%] [49:100.00%] [N/A:N/A] % Reduction in Volume: [43:Grade 4] [49:Grade 1] [N/A:N/A] Classification: [43:Medium] [49:None Present] [N/A:N/A] Exudate A mount: [43:Serosanguineous] [49:N/A] [N/A:N/A] Exudate Type: [43:red, brown] [49:N/A] [N/A:N/A] Exudate Color: [43:Flat and Intact] [49:Distinct, outline attached] [N/A:N/A] Wound Margin: [43:Small (1-33%)] [49:None Present (0%)] [N/A:N/A] Granulation A mount: [43:Pink, Pale] [49:N/A] [N/A:N/A] Granulation Quality: [43:Large (67-100%)] [49:None Present (0%)] [N/A:N/A] Necrotic A mount: [43:Fat Layer (Subcutaneous Tissue)] [49:Fascia: No] [N/A:N/A] Exposed Structures: [43:Exposed: Yes Muscle: Yes Fascia: No  Tendon: No Joint: No Bone: No None] [49:Fat Layer (Subcutaneous Tissue) Exposed: No Tendon: No Muscle: No Joint: No Bone: No Large (67-100%)] [N/A:N/A] Treatment Notes Electronic Signature(s) Signed: 09/28/2019 5:46:39 PM By: Linton Ham MD Signed: 09/28/2019 6:01:25 PM By: [name] RN, BSN Entered By: Linton Ham on 09/28/2019 13:29:02 -------------------------------------------------------------------------------- Multi-Disciplinary Care Plan Details Patient Name: Date of Service: [address] [name] 09/28/2019 1:15 PM Medical Record Number: [id number] Patient Account Number: [id number] Date of Birth/Sex: Treating RN: 1970/04/08 (49 y.o. Nancy Fetter Primary Care Brieanne Mignone: [name], [name] Other Clinician: Referring Arlis Yale: Treating Sarahlynn Cisnero/Extender: [name], [name] Weeks in Treatment: 52 Active Inactive Wound/Skin Impairment Nursing Diagnoses: Impaired tissue integrity Knowledge deficit related to ulceration/compromised skin integrity Goals: Patient/caregiver will verbalize understanding of skin care regimen Date Initiated: 09/03/2018 Target Resolution Date: 10/29/2019 Goal Status: Active Ulcer/skin breakdown will have a volume reduction of 30% by week 4 Date Initiated: 09/03/2018 Date Inactivated: 10/07/2018 Target Resolution Date: 10/01/2018 Goal Status: Unmet Unmet Reason: COMORBITIES Ulcer/skin breakdown will have a volume reduction of 50% by week 8 Date Initiated: 10/07/2018 Date Inactivated: 11/03/2018 Target Resolution Date: 10/31/2018 Goal Status: Unmet Unmet Reason: Osteomyelitis Interventions: Assess patient/caregiver ability to obtain necessary supplies Assess patient/caregiver ability to perform ulcer/skin care regimen upon admission and as needed Assess ulceration(s) every visit Provide education on ulcer and skin care Treatment Activities: Skin care regimen initiated : 09/03/2018 Topical wound management initiated :  09/03/2018 Notes: Electronic Signature(s) Signed: 09/28/2019 6:01:25 PM By: [name] RN, BSN Entered By: [name] on 09/28/2019 13:20:30 -------------------------------------------------------------------------------- Pain Assessment Details Patient Name: Date of Service: [name] 09/28/2019 1:15 PM Medical Record Number: [id number] Patient Account Number: [id number] Date of Birth/Sex: Treating RN: 09/28/1970 (49 y.o. [name] Primary Care Ebon Ketchum: [name], [name] Other Clinician: Referring Edwin Cherian: Treating Shaheen Mende/Extender: [name], [name] Weeks in Treatment: 37 Active Problems Location of Pain Severity and Description of Pain Patient Has Paino No Patient Has Paino No Site Locations Pain Management and Medication Current Pain Management: Electronic Signature(s) Signed: 09/29/2019 5:22:48 PM By: Carlene Coria RN Entered By: Carlene Coria on 09/28/2019 13:06:51 -------------------------------------------------------------------------------- Patient/Caregiver Education Details Patient Name: Date of Service: [name] 6/28/2021andnbsp1:15 PM Medical Record Number: [id number] Patient Account Number: [id number] Date of Birth/Gender: Treating RN: Oct 05, 1970 (48 y.o. Nancy Fetter Primary Care Physician: [name], [name] Other Clinician: Referring  Physician: Treating Physician/Extender: [name], [name] Weeks in Treatment: 52 Education Assessment Education Provided To: Patient Education Topics Provided Wound/Skin Impairment: Methods: Explain/Verbal Responses: State content correctly [employer]) Signed: 09/28/2019 6:01:25 PM By: [name] RN, BSN Entered By: [name] on 09/28/2019 13:20:48 -------------------------------------------------------------------------------- Wound Assessment Details Patient Name: Date of Service: [name] 09/28/2019 1:15  PM Medical Record Number: [id number] Patient Account Number: [id number] Date of Birth/Sex: Treating RN: 1971/01/19 (50 y.o. [name] Primary Care Miata Culbreth: [name], [name] Other Clinician: Referring Jerard Bays: Treating Conroy Goracke/Extender: [name], [name] Weeks in Treatment: 40 Wound Status Wound Number: 43 Primary Diabetic Wound/Ulcer of the Lower Extremity Etiology: Wound Location: Right, Medial Foot Wound Open Wounding Event: Gradually Appeared Status: Date Acquired: 08/04/2018 Comorbid Cataracts, Chronic sinus problems/congestion, Anemia, Sleep Weeks Of Treatment: 55 History: Apnea, Deep Vein Thrombosis, Hypertension, Peripheral Arterial Clustered Wound: Yes Disease, Type I Diabetes, Osteoarthritis, Osteomyelitis, Neuropathy, Seizure Disorder Photos Photo Uploaded By: [name] on 09/28/2019 13:59:38 Wound Measurements Length: (cm) 4.8 Width: (cm) 3 Depth: (cm) 0.1 Area: (cm) 11.31 Volume: (cm) 1.131 % Reduction in Area: 84.4% % Reduction in Volume: 94.8% Epithelialization: None Tunneling: No Undermining: No Wound Description Classification: Grade 4 Wound Margin: Flat and Intact Exudate Amount: Medium Exudate Type: Serosanguineous Exudate Color: red, brown Foul Odor After Cleansing: No Slough/Fibrino Yes Wound Bed Granulation Amount: Small (1-33%) Exposed Structure Granulation Quality: Pink, Pale Fascia Exposed: No Necrotic Amount: Large (67-100%) Fat Layer (Subcutaneous Tissue) Exposed: Yes Necrotic Quality: Adherent Slough Tendon Exposed: No Muscle Exposed: Yes Necrosis of Muscle: No Joint Exposed: No Bone Exposed: No Treatment Notes Wound #43 (Right, Medial Foot) 3. Primary Dressing Applied Hydrofera Blue 4. Secondary Dressing Dry Gauze Roll Gauze 6. Support Layer Education officer, community) Signed: 09/29/2019 5:22:48 PM By: Carlene Coria RN Entered By: Carlene Coria on 09/28/2019  13:08:02 -------------------------------------------------------------------------------- Wound Assessment Details Patient Name: Date of Service: Lorelee New [name] 09/28/2019 1:15 PM Medical Record Number: [id number] Patient Account Number: [id number] Date of Birth/Sex: Treating RN: [birth year] (49 y.o. [name] Primary Care Kirstein Baxley: [name], [name] Other Clinician: Referring Frida Wahlstrom: Treating Aerianna Losey/Extender: [name], [name] Weeks in Treatment: 23 Wound Status Wound Number: 49 Primary Diabetic Wound/Ulcer of the Lower Extremity Etiology: Wound Location: Right Calcaneus Wound Open Wounding Event: Trauma Status: Date Acquired: 09/17/2019 Comorbid Cataracts, Chronic sinus problems/congestion, Anemia, Sleep Weeks Of Treatment: 1 History: Apnea, Deep Vein Thrombosis, Hypertension, Peripheral Arterial Clustered Wound: No Disease, Type I Diabetes, Osteoarthritis, Osteomyelitis, Neuropathy, Seizure Disorder Photos Photo Uploaded By: [name] on 09/28/2019 14:31:20 Wound Measurements Length: (cm) Width: (cm) Depth: (cm) Area: (cm) Volume: (cm) 0 % Reduction in Area: 100% 0 % Reduction in Volume: 100% 0 Epithelialization: Large (67-100%) 0 Tunneling: No 0 Undermining: No Wound Description Classification: Grade 1 Wound Margin: Distinct, outline attached Exudate Amount: None Present Foul Odor After Cleansing: No Slough/Fibrino No Wound Bed Granulation Amount: None Present (0%) Exposed Structure Necrotic Amount: None Present (0%) Fascia Exposed: No Fat Layer (Subcutaneous Tissue) Exposed: No Tendon Exposed: No Muscle Exposed: No Joint Exposed: No Bone Exposed: No Electronic Signature(s) Signed: 09/29/2019 5:22:48 PM By: Carlene Coria RN Entered By: Carlene Coria on 09/28/2019 13:08:26 -------------------------------------------------------------------------------- Vitals Details Patient Name: Date of Service: [place] LLO Abbott Pao  [name] 09/28/2019 1:15 PM Medical Record Number: [id number] Patient Account Number: [id number] Date of Birth/Sex: Treating RN: 12-Oct-1970 (49 y.o. F) Epps, Carrie Primary  Care Devante Capano: [name], [name] Other Clinician: Referring Dantae Meunier: Treating Sarae Nicholes/Extender: [name], [name] Weeks in Treatment: 56 Vital Signs Time Taken: 13:06 Temperature (F): 98.4 Height (in): 67 Pulse (bpm): 84 Weight (lbs): 125 Respiratory Rate (breaths/min): 18 Body Mass Index (BMI): 19.6 Blood Pressure (mmHg): 160/80 Reference Range: 80 - 120 mg / dl Electronic Signature(s) Signed: 09/29/2019 5:22:48 PM By: Carlene Coria RN Entered By: Carlene Coria on 09/28/2019 13:06:42

## 2019-09-30 IMAGING — MR MR FOOT*R* W/O CM
4 of 6 series · 19 of 40 positions shown · non-contrast
Comparison: Right foot x-rays dated August 06, 2017. Right foot MRI
dated January 01, 2017.

CLINICAL DATA: Chronic diabetic foot ulcer. Prior amputations.
Evaluate for osteomyelitis.

EXAM:
MRI OF THE RIGHT FOREFOOT WITHOUT CONTRAST
TECHNIQUE: Multiplanar, multisequence MR imaging of the right ankle was
performed. No intravenous contrast was administered.

[Series 2: T1 · coronal · 3.0mm · 0.23mm/px · 8 of 36 slices shown (1 of 2)]
[im 1/36]
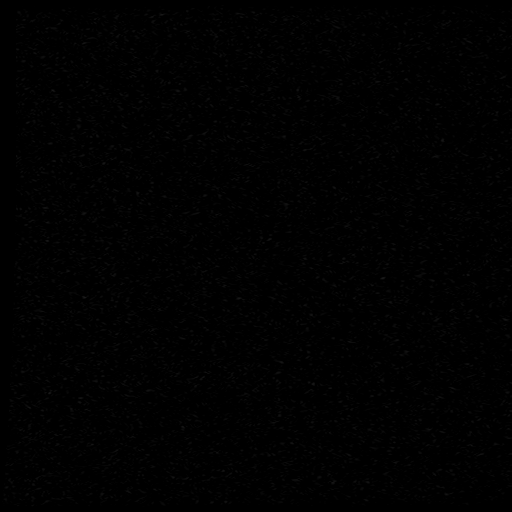
[im 6/36]
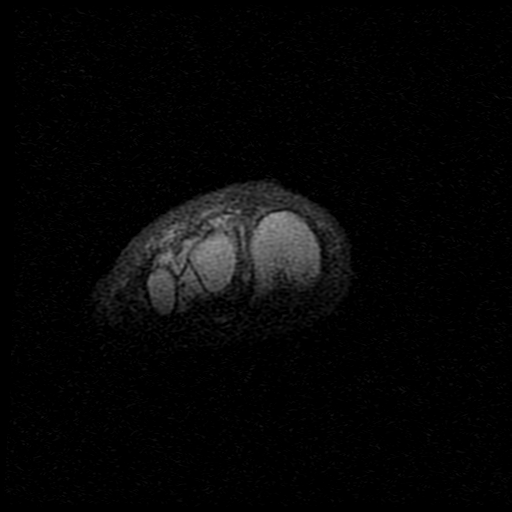
[im 11/36]
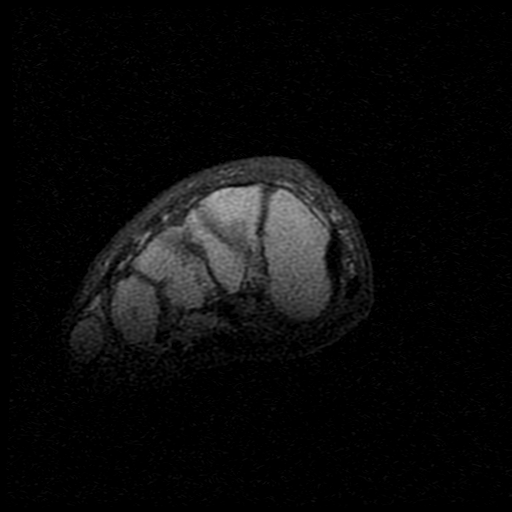
[im 16/36]
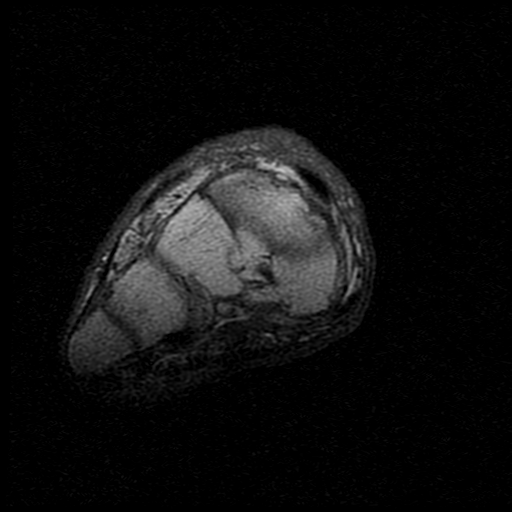
[im 21/36]
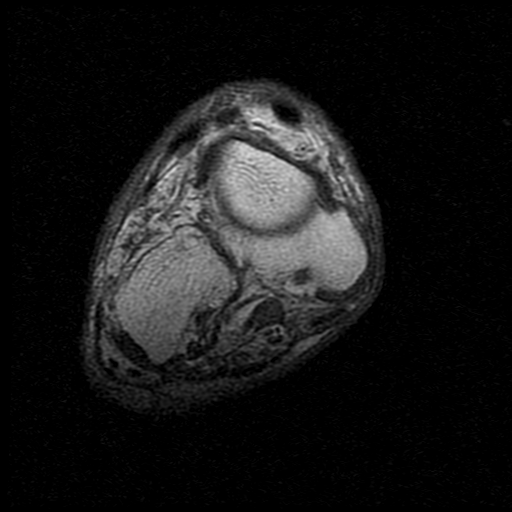
[im 26/36]
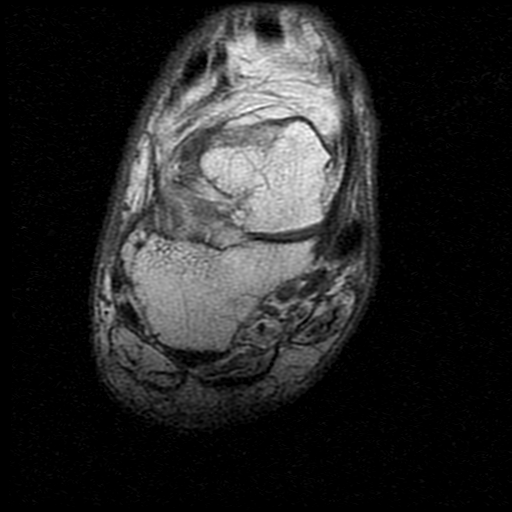
[im 31/36]
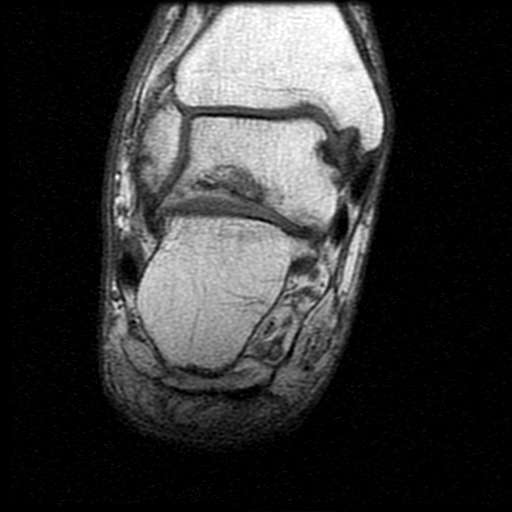
[im 36/36]
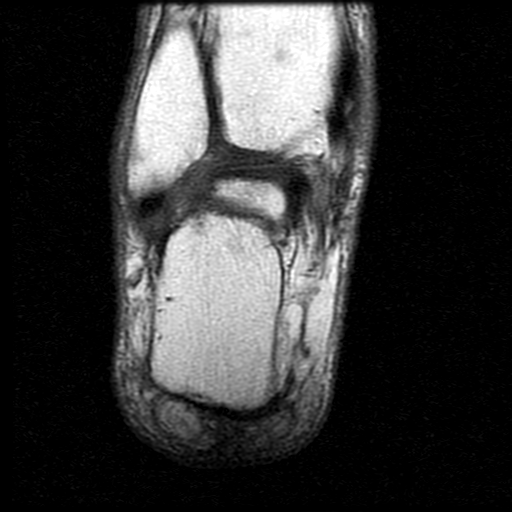

[Series 3: T2 · coronal · 3.0mm · 0.23mm/px · 3 of 36 slices shown (1 of 2)]
[im 6/36]
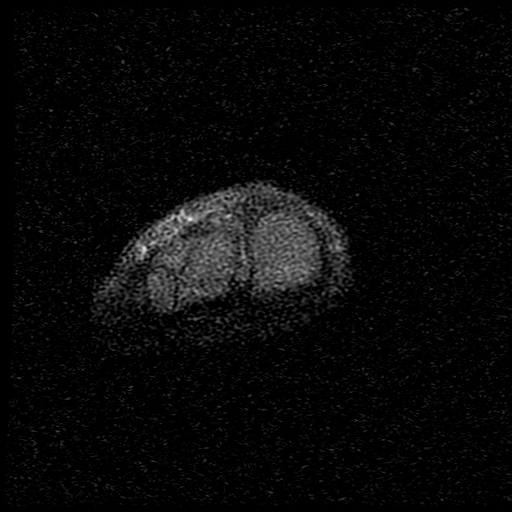
[im 21/36]
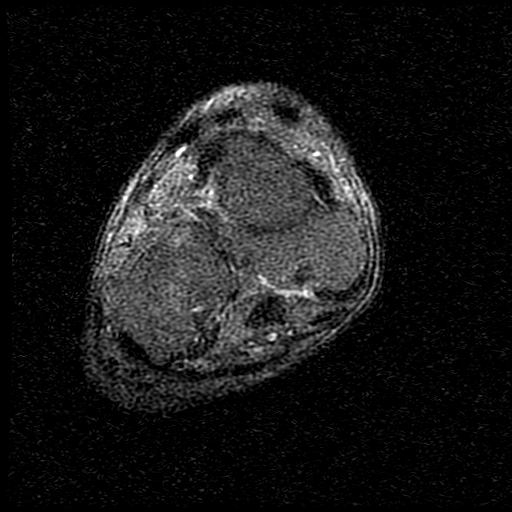
[im 31/36]
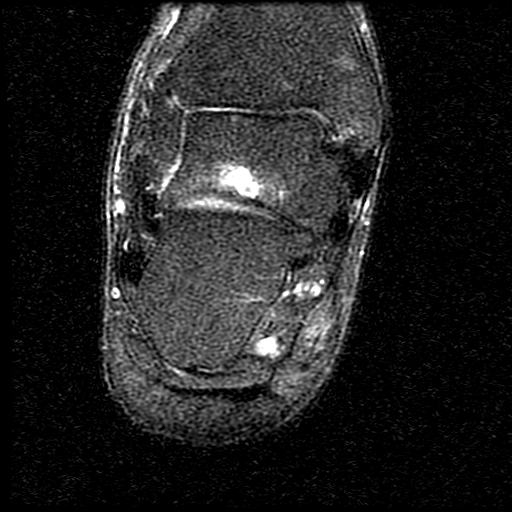

[Series 4: T2 · axial · 3.0mm · 0.35mm/px · z∈[-81,-2]mm · 3 of 30 slices shown (2 of 2)]
[im 5/30]
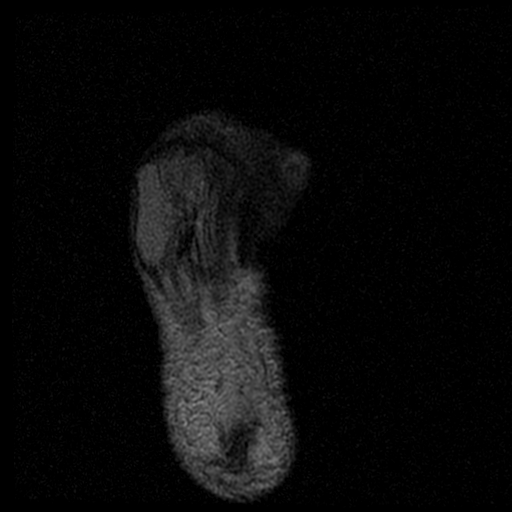
[im 15/30]
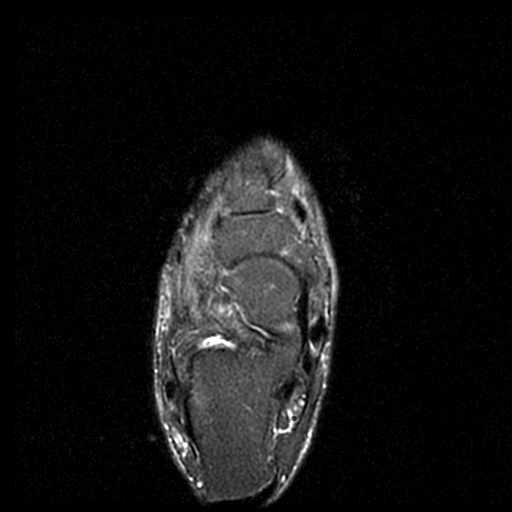
[im 25/30]
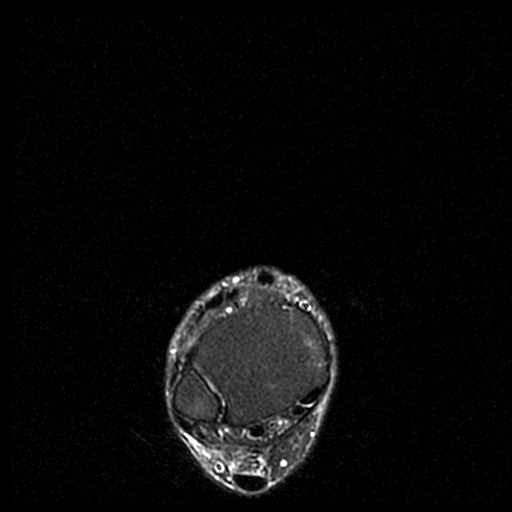

[Series 5: T1 · axial · 3.0mm · 0.35mm/px · z∈[-97,-2]mm · 5 of 30 slices shown (2 of 2)]
[im 1/30]
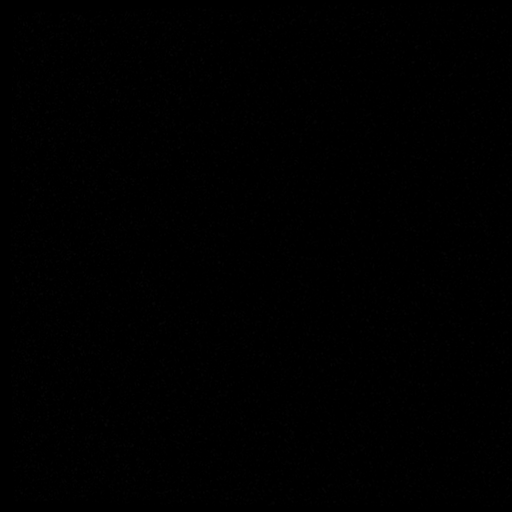
[im 5/30]
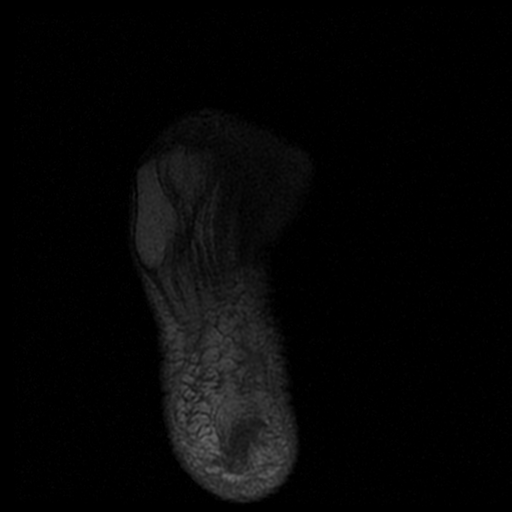
[im 10/30]
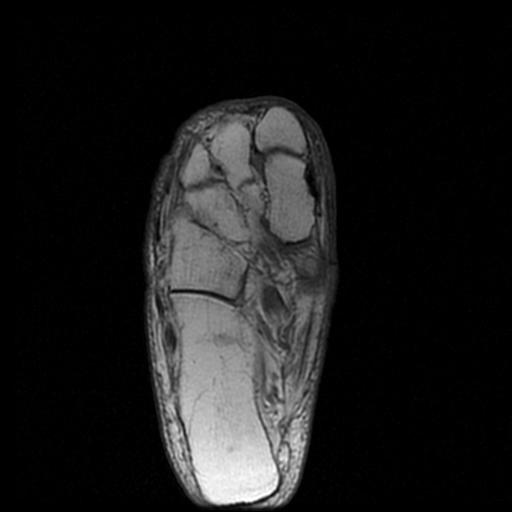
[im 15/30]
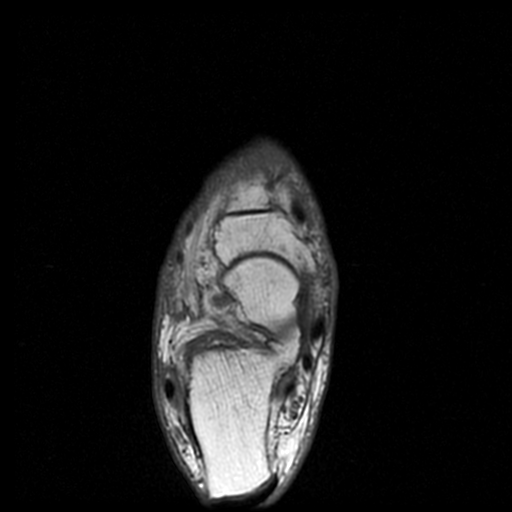
[im 25/30]
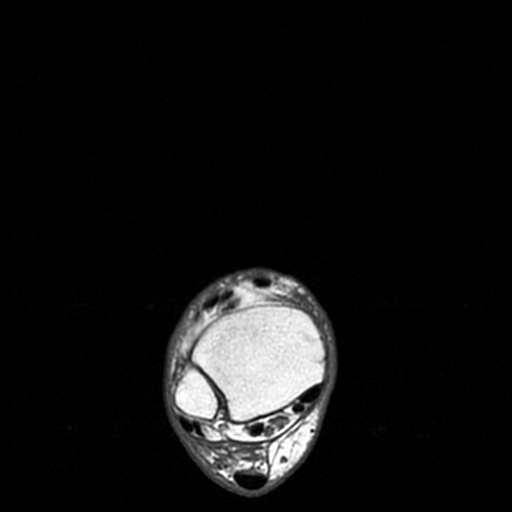

[19 of 40 positions shown; findings below may reference images not displayed]

FINDINGS: TENDONS

Peroneal: Peroneal longus tendon intact. Peroneal brevis intact.

Posteromedial: Posterior tibial tendon intact. Flexor hallucis
longus tendon intact. Flexor digitorum longus tendon intact.

Anterior: Tibialis anterior tendon intact. Extensor hallucis longus
tendon intact Extensor digitorum longus tendon intact.

Achilles:  Intact.

Plantar Fascia: Intact.

LIGAMENTS

Lateral: Anterior talofibular ligament intact. Calcaneofibular
ligament intact. Posterior talofibular ligament intact. Anterior and
posterior tibiofibular ligaments intact.

Medial: Deltoid ligament intact. Spring ligament intact.

CARTILAGE

Ankle Joint: No joint effusion. Normal ankle mortise. No chondral
defect.

Subtalar Joints/Sinus Tarsi: Unchanged degenerative subchondral
marrow edema along the talar aspect of the posterior subtalar joint.
No subtalar joint effusion. Normal sinus tarsi.

Bones: Prior transmetatarsal amputation. No suspicious marrow signal
abnormality. Resolved osteomyelitis of the residual first metatarsal
base. No acute fracture or dislocation. No focal bone lesion.

Soft Tissue: No fluid collection or soft tissue mass.
IMPRESSION: 1. No evidence of osteomyelitis.

## 2019-09-30 IMAGING — MR MR FOOT*L* W/O CM
4 of 6 series · 19 of 40 positions shown · non-contrast
Comparison: MRI 05/06/2017 and 09/06/2017.  Radiographs 09/03/2017.

CLINICAL DATA: Diabetic with multiple foot amputations for
osteomyelitis. Evaluate for recurrent infection.

EXAM:
MRI OF THE LEFT FOOT WITHOUT CONTRAST
TECHNIQUE: Multiplanar, multisequence MR imaging of the left forefoot was
performed. No intravenous contrast was administered.

[Series 3: T1 · coronal · 3.0mm · 0.23mm/px · 8 of 43 slices shown (1 of 2)]
[im 1/43]
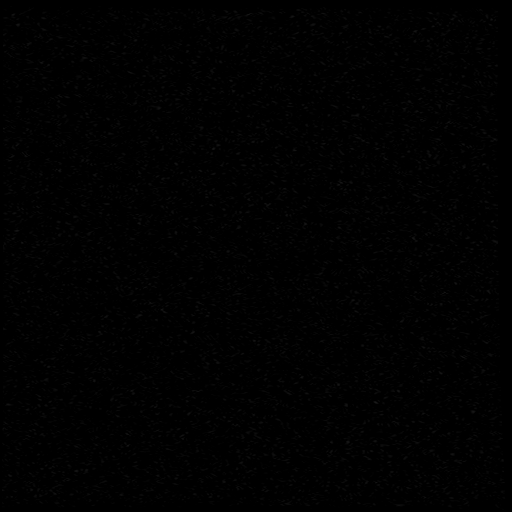
[im 5/43]
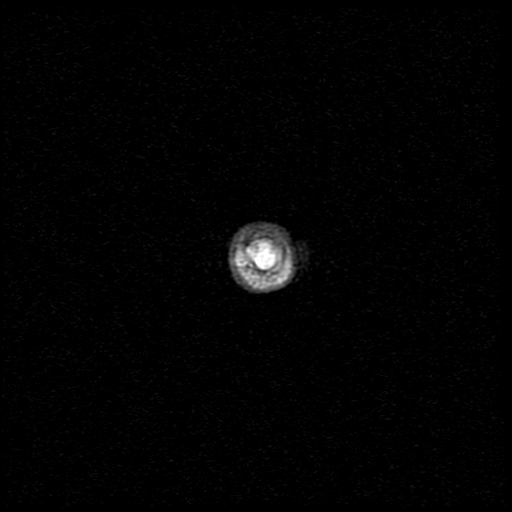
[im 15/43]
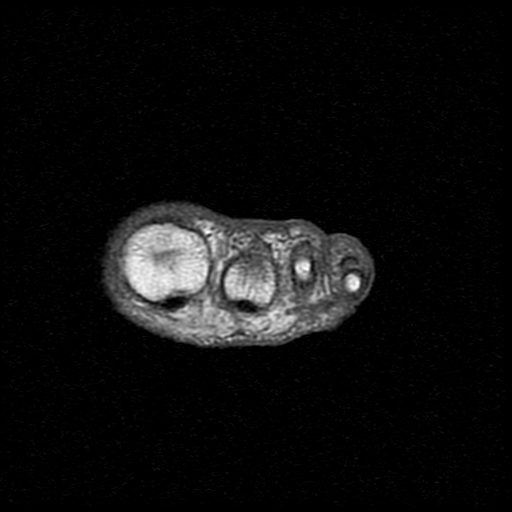
[im 19/43]
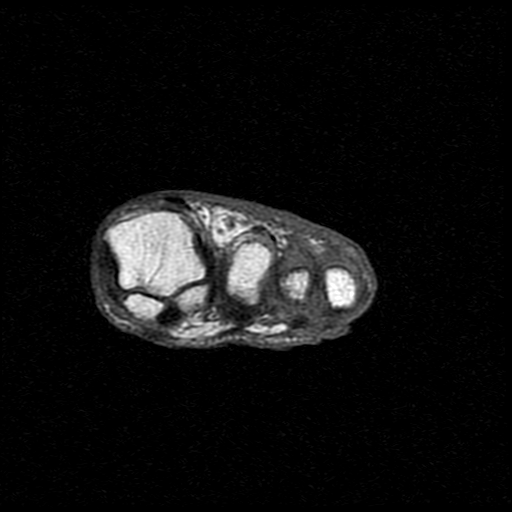
[im 24/43]
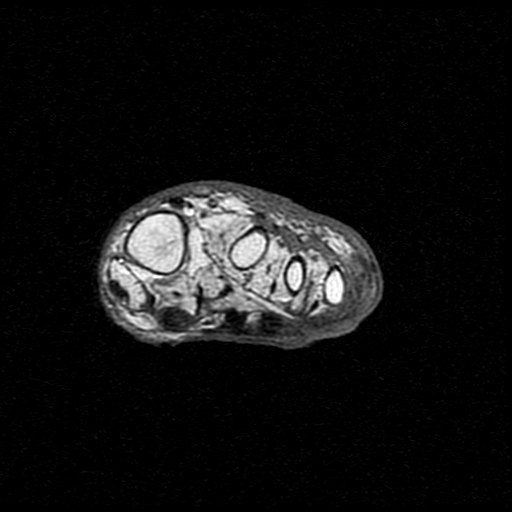
[im 29/43]
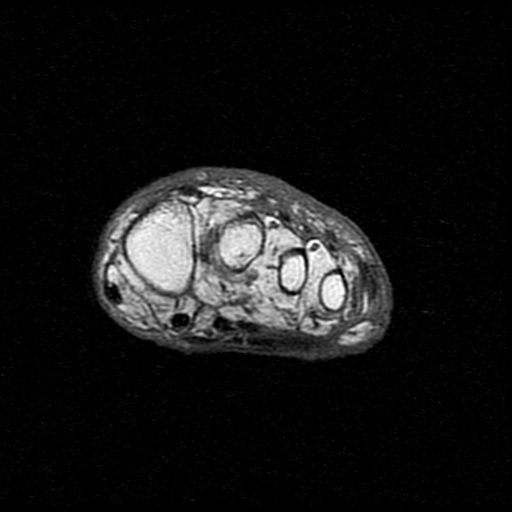
[im 38/43]
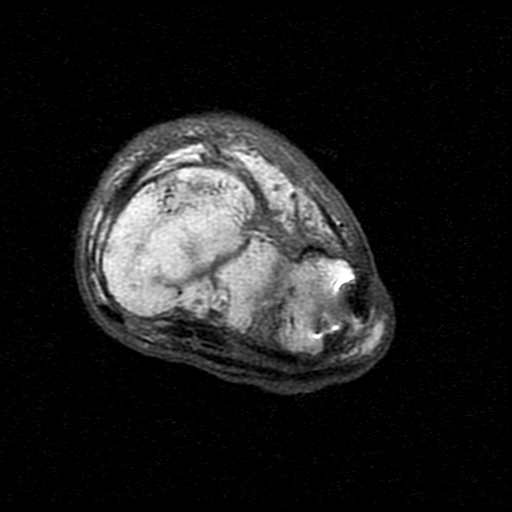
[im 43/43]
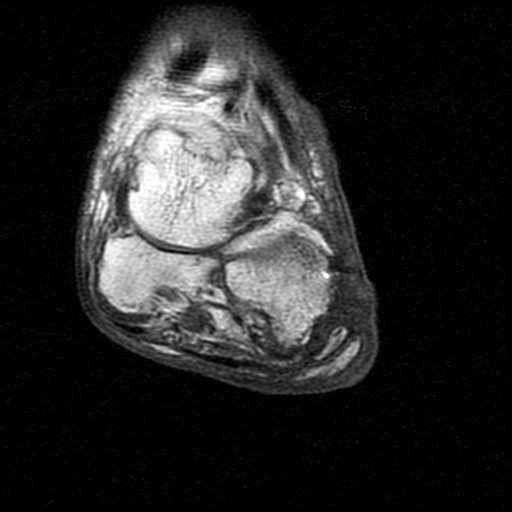

[Series 4: T2 · coronal · 3.0mm · 0.23mm/px · 3 of 43 slices shown (1 of 2)]
[im 5/43]
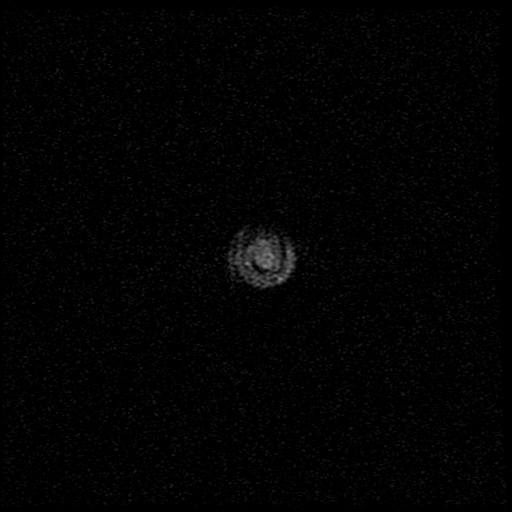
[im 24/43]
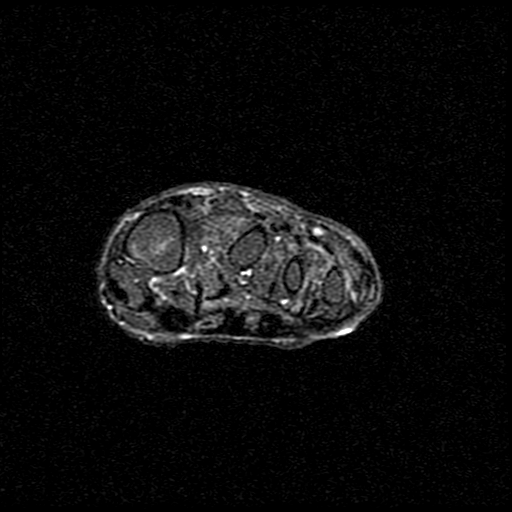
[im 38/43]
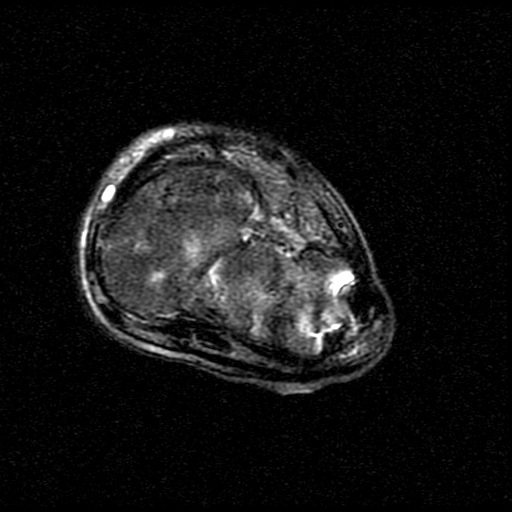

[Series 5: T2 · axial · 3.0mm · 0.35mm/px · z∈[-64,+27]mm · 3 of 24 slices shown (2 of 2)]
[im 1/24]
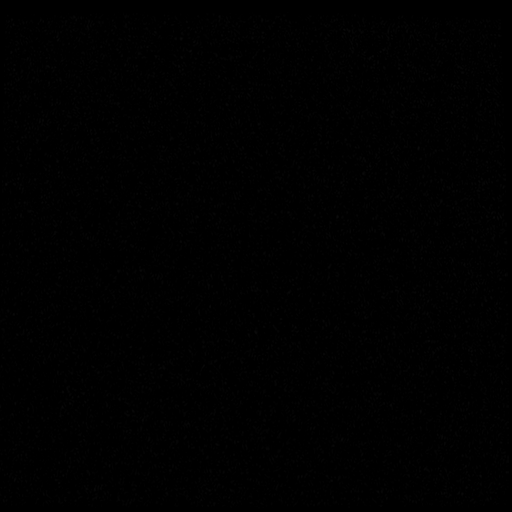
[im 12/24]
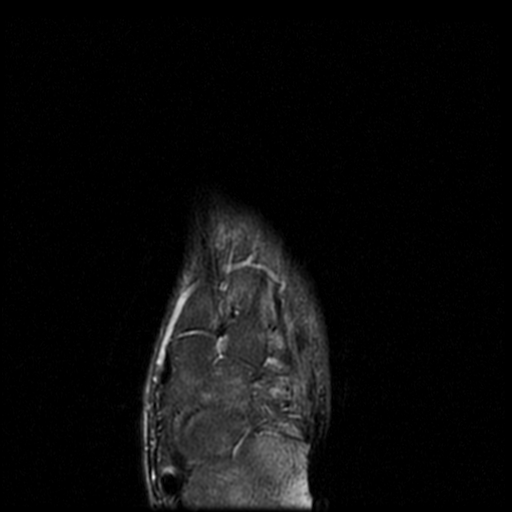
[im 24/24]
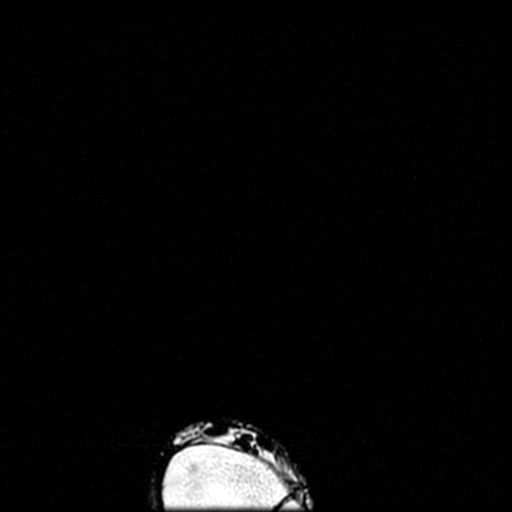

[Series 6: T1 · axial · 3.0mm · 0.35mm/px · z∈[-64,+27]mm · 5 of 24 slices shown (2 of 2)]
[im 1/24]
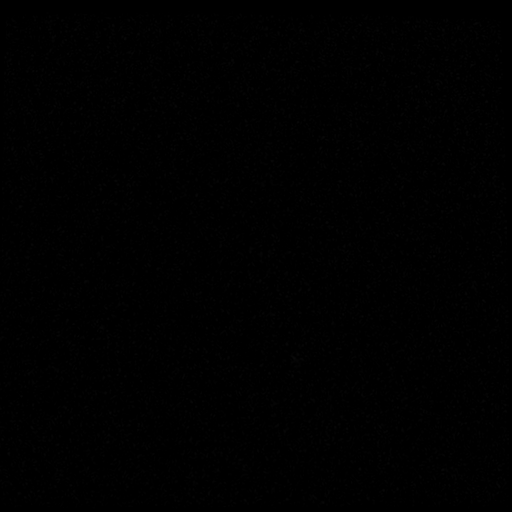
[im 6/24]
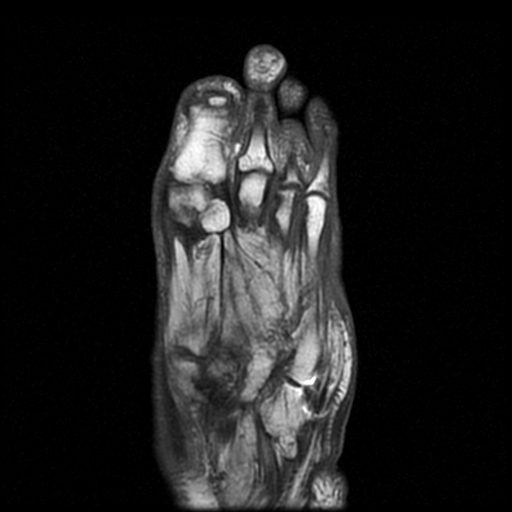
[im 12/24]
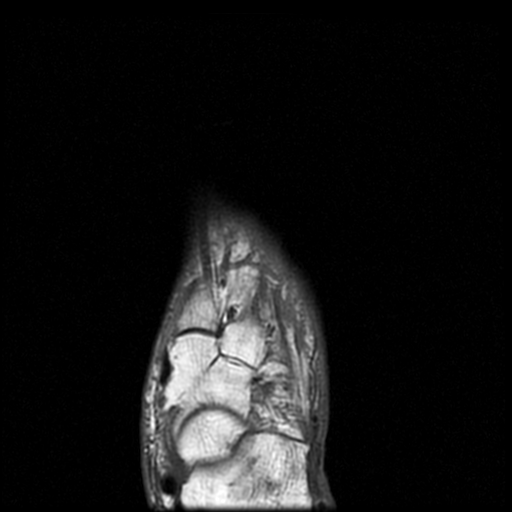
[im 18/24]
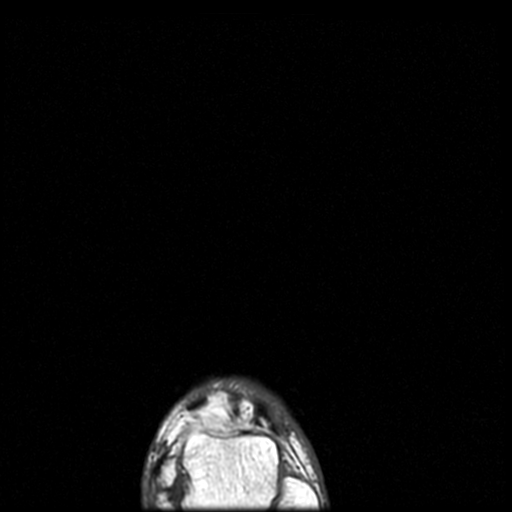
[im 24/24]
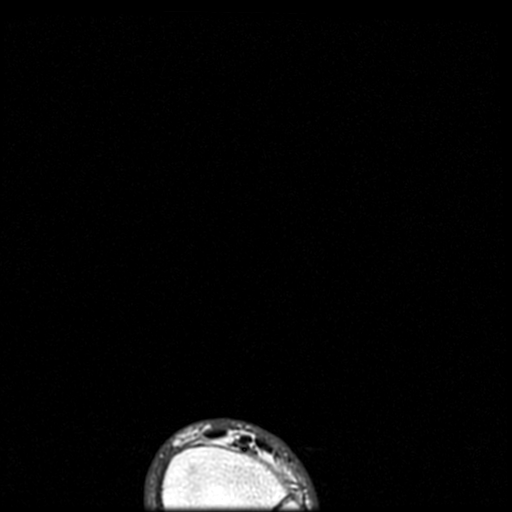

[19 of 40 positions shown; findings below may reference images not displayed]

FINDINGS: Bones/Joint/Cartilage

Previous amputation of the distal 1st phalanx and the 5th ray. The
previously demonstrated bone destruction, marrow and surrounding
soft tissue enhancement at the 3rd metatarsophalangeal joint have
resolved. There are probable mild underlying erosive changes at that
joint. Chronic dislocation at the 4th PIP joint is stable. There is
chronic midfoot arthropathy with probable multifocal ankylosis of
the tarsal bones and 2nd and 3rd metatarsal bases.

Ligaments

No acute ligamentous abnormalities identified.

Muscles and Tendons

No tenosynovitis or focal muscular abnormality identified. There is
generalized forefoot muscular atrophy.

Soft tissues

There is chronic soft tissue ulceration along the plantar and
lateral aspect of the forefoot which has improved compared with the
prior study. No focal fluid collection identified. There is
postsurgical susceptibility artifact adjacent to the base of the 4th
metatarsal.
IMPRESSION: 1. Interval resolution of the findings surrounding the 3rd
metatarsophalangeal joint on prior study, consistent with treated
osteomyelitis.
2. Chronic soft tissue ulceration in the plantar and lateral aspect
of the forefoot without residual focal fluid collection.
3. Stable postsurgical changes.
4. No evidence of recurrent osteomyelitis.

## 2019-10-06 ENCOUNTER — Telehealth: Payer: Self-pay

## 2019-10-06 NOTE — Telephone Encounter (Signed)
Called and spoke with Amy at [facility] because we have not been able to reach patient though we have tried to schedule her for a graft placement since march. She said that we can stop trying for now because patient is refusing and very defensive about having another surgery. She said that their doctors will be rounding on patient and she will let us know if they can talk her into it or if they choose to continue with the [facility].   Early notified.   Sofia Jaquith Mindi Junker

## 2019-10-07 ENCOUNTER — Telehealth: Payer: Self-pay | Admitting: Endocrinology

## 2019-10-07 NOTE — Telephone Encounter (Signed)
[employer] called to request that we send the requested documentation to [facility] for her [employer] supplies - number for [facility] is [phone].  [employer] also requested that we reach out to the patient an update her on this.

## 2019-10-07 NOTE — Telephone Encounter (Signed)
Patient requests to be called asap at ph# [phone] re: Patient states she is very upset. Patient states she has been going back and forth with [facility] for her Dexcom testing supplies for over two weeks. [facility] told patient that they have sent Dr. Dwyane Dee requests for chart notes with no response and that if they do not receive patient's chart notes-patient will not receive her testing supplies. Patient is going out of state [facility]) on Monday 10/12/19. Patient has been out of her Dexcom testing supplies for at least 2 days. Patient states this beyond urgent at this time.

## 2019-10-08 NOTE — Telephone Encounter (Signed)
Called pt and spoke with her further. She stated that this message below is not what she told the office staff when she called. She stated that she has been going back and forth for two weeks with [facility], and NOT this office. Patient was informed that this Tuesday (10/06/19) a fax was received regarding her, but prior to this, nothing has been received. Pt was informed that I would contact [facility] personally, and I would speak with someone regarding this. Pt verbalized understanding.

## 2019-10-08 NOTE — Telephone Encounter (Signed)
Called pt and informed her of all of the efforts and information below. Pt verbalized understanding of this. To ensure pt does not run out of sensors and transmitters, pt was informed that she can have a sample of 1 month of sensors and a transmitter than will last 90 days. Pt verbalized understanding of this, and she will come get them either today or tomorrow.

## 2019-10-08 NOTE — Telephone Encounter (Signed)
Called [facility] and spoke with Ubaldo Glassing D. (call ended at 11:47). She stated that the documentation that she was able to view does not show that anyone from [facility] has contact this office via phone. In addition, the documentation that they sent was just sent yesterday and two days prior to that, not in excess of two weeks.  A request was made by me that a note be placed in patient's chart that if the documentation has not been received by 3pm, [facility] call me direclty. [facility] also stated that there is a specific rep that handles her account, and this rep will contact me at my directly line today at 3pm if anything else is needed or if the documents are not received.

## 2019-10-08 NOTE — Telephone Encounter (Signed)
Will contact patient this morning, however, no conversation whatsoever has taken place with this patient either over the phone, in person, or otherwise since her last office visit on 09/01/19, and during this visit, nothing was discussed between the patient or myself regarding Dexcom testing supplies.

## 2019-10-08 NOTE — Telephone Encounter (Signed)
When fax machine was cleared off this morning by clerical staff of all faxes that were received by this office between the hours of 5pm and opening time this morning, a fax was on the machine for this patient from [facility]. The fax contained a request for the most recent chart notes and a standard written order form for insulin pump supplies. This will be done expeditiously and sent back today. Phone call to [facility] supply will still take place.

## 2019-10-09 ENCOUNTER — Other Ambulatory Visit: Payer: Self-pay

## 2019-10-09 ENCOUNTER — Encounter (HOSPITAL_BASED_OUTPATIENT_CLINIC_OR_DEPARTMENT_OTHER): Payer: Medicare HMO | Attending: Internal Medicine | Admitting: Internal Medicine

## 2019-10-09 DIAGNOSIS — I129 Hypertensive chronic kidney disease with stage 1 through stage 4 chronic kidney disease, or unspecified chronic kidney disease: Secondary | ICD-10-CM | POA: Diagnosis not present

## 2019-10-09 DIAGNOSIS — Z87891 Personal history of nicotine dependence: Secondary | ICD-10-CM | POA: Insufficient documentation

## 2019-10-09 DIAGNOSIS — M8580 Other specified disorders of bone density and structure, unspecified site: Secondary | ICD-10-CM | POA: Diagnosis not present

## 2019-10-09 DIAGNOSIS — E10621 Type 1 diabetes mellitus with foot ulcer: Secondary | ICD-10-CM | POA: Diagnosis not present

## 2019-10-09 DIAGNOSIS — L02415 Cutaneous abscess of right lower limb: Secondary | ICD-10-CM | POA: Insufficient documentation

## 2019-10-09 DIAGNOSIS — G40909 Epilepsy, unspecified, not intractable, without status epilepticus: Secondary | ICD-10-CM | POA: Diagnosis not present

## 2019-10-09 DIAGNOSIS — E1042 Type 1 diabetes mellitus with diabetic polyneuropathy: Secondary | ICD-10-CM | POA: Insufficient documentation

## 2019-10-09 DIAGNOSIS — Z9119 Patient's noncompliance with other medical treatment and regimen: Secondary | ICD-10-CM | POA: Diagnosis not present

## 2019-10-09 DIAGNOSIS — Z86718 Personal history of other venous thrombosis and embolism: Secondary | ICD-10-CM | POA: Diagnosis not present

## 2019-10-09 DIAGNOSIS — M86671 Other chronic osteomyelitis, right ankle and foot: Secondary | ICD-10-CM | POA: Insufficient documentation

## 2019-10-09 DIAGNOSIS — E1022 Type 1 diabetes mellitus with diabetic chronic kidney disease: Secondary | ICD-10-CM | POA: Insufficient documentation

## 2019-10-09 DIAGNOSIS — L97529 Non-pressure chronic ulcer of other part of left foot with unspecified severity: Secondary | ICD-10-CM | POA: Insufficient documentation

## 2019-10-09 DIAGNOSIS — L97411 Non-pressure chronic ulcer of right heel and midfoot limited to breakdown of skin: Secondary | ICD-10-CM | POA: Diagnosis not present

## 2019-10-09 DIAGNOSIS — Z9641 Presence of insulin pump (external) (internal): Secondary | ICD-10-CM | POA: Insufficient documentation

## 2019-10-09 DIAGNOSIS — N189 Chronic kidney disease, unspecified: Secondary | ICD-10-CM | POA: Diagnosis not present

## 2019-10-09 DIAGNOSIS — M1990 Unspecified osteoarthritis, unspecified site: Secondary | ICD-10-CM | POA: Insufficient documentation

## 2019-10-09 NOTE — Telephone Encounter (Signed)
Called pt and reminded her that there are samples her waiting on her to pick up. Pt stated that her sister would be here today to pick up the samples.

## 2019-10-13 NOTE — Progress Notes (Addendum)
CLAIR, BARDWELL ([id number]) Visit Report for 10/09/2019 HPI Details Patient Name: Date of Service: [name] 10/09/2019 1:15 PM Medical Record Number: [id number] Patient Account Number: [id number] Date of Birth/Sex: Treating RN: 23-Nov-1970 (49 y.o. [name] Primary Care Provider: [name], [name] Other Clinician: Referring Provider: Treating Provider/Extender: [name], [name] Weeks in Treatment: 44 History of Present Illness HPI Description: 49 year old diabetic who is known to have type 1 diabetes which is poorly controlled last hemoglobin A1c was 11%. She comes in with a ulcerated area on the left lateral foot which has been there for over 6 months. Was recently she has been treated by [name] of podiatry who saw her last on 05/28/2016. Review of his notes revealed that the patient had incision and drainage with placement of antibiotic beads to the left foot on 04/11/2016 for possible osteomyelitis of the cuboid bone. Over the last year she's had a history of amputation of the left fifth toe and a femoropopliteal popliteal bypass graft somewhere in April 2017. 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 His note [name] mentions that the patient has been referred to me for further wound care and possibly great candidate for hyperbaric oxygen therapy due to recurrent osteomyelitis. However we do not have any x-rays of biopsy reports confirming this. He has been on several antibiotics including Bactrim and most recently is on doxycycline for an MRSA. I understand, the patient was not a candidate for IV antibiotics as she has had previous PICC lines which resulted in blood clots in both arms. There was a x-ray report dated 04/04/2016 on [name] notes which showed evidence of fifth ray resection left foot with osteolytic changes noted to the fourth metatarsal and cuboid bone on the left. 06/13/2016 -- had a left foot x-ray which  showed no acute fracture or dislocation and no definite radiographic evidence of osteomyelitis. Advanced osteopenia was seen. 06/20/2016 -- she has noticed a new wound on the right plantar foot in the region where she had a callus before. 06/27/16- the patient did have her x-ray of the right foot which showed no findings to suggest osteomyelitis. She saw her endocrinologist, [name], yesterday. Her A1c in January was 11. He also indicates mismanagement and noncompliance regarding her diabetes. She is currently on Bactrim for a lip infection. She is complaining of nausea, vomiting and diarrhea. She is unable to articulate the exact orders or dosing of the Bactrim; it is unclear when she will complete this. 07/04/2016 -- results from [facility] of ABIs with ankle waveforms were noted from 02/14/2016. The examination done on 06/27/2015 showed noncompressible ABIs with the right being 1.45 and the left being 1.33. The present examination showed a right ABI of 1.19 on the left of 1.33. The conclusion was that right normal ABI in the lower extremity at rest however compared to previous study which was noncompressible ABI may be falsely elevated side suggesting medial calcification. The left ABI suggested medial calcification. 08/01/2016 -- the patient had more redness and pain on her right foot and did not get to come to see as noted she see her PCP or go to the ER and decided to take some leftover metronidazole which she had at home. As usual, the patient does report she feels and is rather noncompliant. 08/08/2016 -- -- x-ray of the right foot -- FINDINGS:Transmetatarsal amputation is noted. No bony destruction is noted to suggest osteomyelitis. IMPRESSION: No evidence of  osteomyelitis. Postsurgical changes are seen. MRI would be more sensitive for possible bony changes. Culture has grown Serratia Marcescens -- sensitive to Bactrim, ciprofloxacin, ceftazidime she was seen by [name] on  08/06/2016. He did not find any exposed bone, muscle, tendon, ligament or joint. There was no malodor and he did a excisional debridement in the office. ============ Old notes: 49 year old patient who is known to the wound clinic for a while had been away from the wound clinic since 09/01/2014. Over the last several months she has been admitted to various hospitals including [facility] [place]. She was treated for a right metatarsal osteomyelitis with a transmetatarsal amputation and this was done about 2 months ago. He has a small ulcerated area on the right heel and she continues to have an ulcerated area on the left plantar aspect of the foot. The patient was recently admitted to the [facility] between 7/12 and 10/18/2014. she was given 3 weeks of IV vancomycin and was to follow-up with her surgeons at [facility] and also took oral vancomycin for C. difficile colitis. 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 MRI of the left foot done on 10/14/2014 showed no abscess or osteomyelitis. 04/27/15; this is a patient we know from previous stays in the wound care center. She is a type I diabetic I am not sure of her control currently. Since the last time I saw her she is had a right transmetatarsal amputation and has no wounds on her right foot and has no open wounds. She is been followed at the wound care center at [facility] in [place]. She comes today with the desire to undergo hyperbaric treatment locally. Apparently one of her wound care providers in [place] has suggested hyperbarics. This is in response to an MRI from 04/18/15 that showed increased marrow signal and loss of the proximal  fifth metatarsal cortex evidence of osteomyelitis with likely early osteomyelitis in the cuboid bone as well. She has a large wound over the base of the fifth metatarsal. She also has a eschar over her the tips of her toes on 1,3 and 5. She does not have peripheral pulses and apparently is going for an angiogram tomorrow which seems reasonable. After this she is going to infectious disease at [facility]. They have been using Medihoney to the large wound on the lateral aspect of the left foot to. The patient has known Charcot deformity from diabetic neuropathy. She also has known diabetic PAD. Surprisingly I can't see that she has had any recent antibiotics, the patient states the last antibiotic she had was at the end of November for 10 days. I think this was in response to culture that showed group G strep although I'm not exactly sure where the culture was from. She is also had arterial studies on 03/29/15. This showed a right ABI of 1.4 that was noncompressible. Her left ABI was 0.73. 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 It was not felt that arterial inflow was adequate for healing of a foot ulcer. Her Doppler waveforms looked monophasic ===== READMISSION 02/28/17; this is in an now 49 year old woman we've had at several different occasions in this clinic. 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 She has  a remote ex-smoker. She was last seen in this clinic by Dr. Con Memos I think in May. More recently she is been followed by her podiatrist [name] an infectious disease Dr. Megan Salon. She has 2 open wounds the major one is over the right first metatarsal head she also has a wound on the left plantar foot. an MRI of the right foot on 01/01/17 showed a soft tissue ulcer along the plantar aspect of the first metatarsal base consistent with osteomyelitis of the first metatarsal stump. Dr. 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 According to the patient this is been open for slightly over a month. She has been on a combination of Cipro 500 twice a day, Zyvox 600 twice a day and Flagyl 500 3 times a day for over a month now as directed by Dr. Megan Salon. cultures of the right foot earlier this year showed MRSA in January and Serratia in May. January also had a few viridans strep. Recent x-rays of both feet were done and [name] office and I don't have these reports. The patient has known PAD and has a history of aleft femoropopliteal bypass in April 2017. 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 Dwyane Dee. In spite of this the last hemoglobin A1c I can see is 10.1 on 01/01/2017. She is being referred by 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 She is been using Medihoney to this area and also an area on the left midfoot. She is using healing sandals bilaterally. ABIs in this clinic at the left posterior tibial was 1.1 noncompressible on the right READMISSION Non invasive vascular NOVANT 5/18 Aftercare following surgery of the circulatory system Procedure Note - Interface, External Ris In - 08/13/2016 11:05 AM EDT Procedure: Examination consists of physiologic resting arterial pressures of the brachial and ankle arteries bilaterally with continuous wave Doppler waveform analysis. Previous: Previous exam performed on 02/14/16 demonstrated ABIs of Rt = 1.19 and Lt = 1.33. Right: ABI = non-compressible PT 1.47 DP. S/P transmet amputation. , Left: ABI = 1.52, 2nd digit pressure = 87 mmHg Conclusions: Right: ABI (>1.3) may be falsely elevated, suggesting medial calcification. Left: ABI (>1.3) may be falsely  elevated, suggesting medial calcification The patient is a now 49 year old type I diabetic is had multiple issues her graded to chronic diabetic foot ulcers. 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 We had her in the clinic lastin November. At that point she had wounds on her bilateral feet.she had wanted to try hyperbarics however the healogics review process denied her because she hadn't followed up with her vascular surgeon for her left femoropopliteal bypass. The bypass was done by Dr. Raul Del at [facility]. We made her a follow-up with Dr. Raul Del however she did not keep the appointment and therefore she was not approved The patient shows me a small wound on her left fourth metatarsal head on her phone. She developed rapid discoloration in the plantar aspect of the left foot and she was admitted to hospital from 2/2 through 05/10/17 with wet gangrene of the left foot osteomyelitis of the fourth metatarsal heads. She was admitted acutely ill with a temperature of 103.  She was started on broad-spectrum vancomycin and cefepime. On 05/06/17 she was taken to the OR by [name] her podiatric surgeon for an incision and drainage irrigation of the left foot wound. Cultures from this surgery revealed group be strep and anaerobes. she was seen by Dr.Xu of orthopedic surgery and scheduled for a below-knee amputation which she u refused. Ultimately she was discharged on Levaquin and Flagyl for one month. MRI 05/05/17 done while she was in the hospital showed abscess adjacent to the fourth metatarsal head and neck small abscess around the fourth flexor tendon. Inflammatory phlegmon and gas in the soft tissues along the lateral aspect of the fourth phalanx. Findings worrisome for osteomyelitis involving the fourth proximal and middle phalanx and also the third and fourth metatarsals. Finally the patient had actually shortly  before this followed up with Dr. Raul Del at no time on 04/29/17. He felt that her left femoropopliteal bypass was patent he felt that her left-sided toe pressures more than adequate for healing a wound on the left foot. This was before her acute presentation. 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 05/28/17; she is started hyperbarics. The patient tells me that for some reason she was not actually on Levaquin but I think on ciprofloxacin. She was on Flagyl. She only started her Levaquin yesterday due to some difficulty with the pharmacy and perhaps her sister picking it up. She has an appointment with [name] tomorrow and with infectious disease early next week. She has no new complaints 06/06/17; the patient continues in hyperbarics. She saw [name] on 05/29/17 who is her podiatric surgeon. He is elected for a transmetatarsal amputation on 06/27/17. I'm not sure at what level he plans to do this amputation. The patient is unaware She also saw Dr. Megan Salon of infectious disease who elected to continue her on current antibiotics I think this is ciprofloxacin and Flagyl. I'll need to clarify with her tomorrow if she actually has this. We're using silver alginate to the actual wound. Necrotic surface today with material under the flap of her foot. Original MRI showed abscesses as well as osteomyelitis of the proximal and middle fourth phalanx and the third and fourth metatarsal heads 06/11/17; patient continues in hyperbarics and continues on oral antibiotics. She is doing well. The wound looks better. The necrotic part of this under the flap in her superior foot also looks better. she is been to see [name]. I haven't had a chance to look at his note. Apparently he has put the transmetatarsal amputation on hold her request it is still planning to take her to the OR for debridement and product application ACEL. I'll see if I can find his note. I'll therefore leave product ordering/requests to [name]  for now. I was going to look at Dermagraft 06/18/17-she is here in follow-up evaluation for bilateral foot wounds. She continues with hyperbaric therapy. She states she has been applying manuka honey to the right plantar foot and alternate manuka honey and silver alginate to the left foot, despite our orders. We will continue with same treatment plan and she will follo up next week. 06/25/17; I have reviewed [name] last note from 3/11. She has operative debridement in 2 days' time. By review his note apparently they're going to place there is skin over the majority of this wound which is a good choice. She has a small satellite area at the most proximal part of this wound on the left plantar foot. The area on the right  plantar foot we've been using silver alginate and it is close to healing. 07/02/17; unfortunately the patient was not easily approved for [name] proposed surgery. I'm not completely certain what the issue is. She has been using silver alginate to the wound she has completed a first course of hyperbarics. She is still on Levaquin and Flagyl. I have really lost track of the time course here.I suspect she should have another week to 2 of antibiotics. I'll need to see if she is followed up with infectious disease Dr. Megan Salon 07/09/17; the patient is followed up with Dr. Megan Salon. 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 She continues on Levaquin and metronidazole continuing both of these for now I think she is been on fr about 6 weeks. She still has some drainage but no pain. No fever. Her had been plans for her to go to the OR for operative debridement with her podiatrist [name], I am not exactly sure where that is. I'll probably slip a note to [name] today. I note that she follows with Dr. Dwyane Dee of endocrinology. We have her recertified for hyperbaric oxygen. I have not heard about Dermagraft however I'll see if [name] is planning a  skin substitute as well 07/16/17; the patient tells me she is just about out of [place]. I'll need to check Dr. Hale Bogus last notes on this. She states she has plenty of Flagyl however. She comes in today complaining of pain in the right lateral foot which she said lasted for about a day. The wound on the right foot is actually much more medially. She also tells me that the [facility] cost a lot of pain in the left foot wound and she turned back to silver alginate. Finally Dermagraft has a $481 per application co-pay. She cannot afford this 07/23/17; patient arrives today with the wound not much smaller. There is not much new to add. She has not heard from [name] all try to put in a call to them today. She was asking about Dermagraft again and she has an over $856 per application co-pay she states that she would be willing to try to do a payment plan. I been tried to avoid this. We've been using silver alginate, I'll change to [facility] 07/30/17-She is here in follow-up evaluation for left foot ulcer. She continues hyperbaric medicine. The left foot ulcer is stable we will continue with same treatment plan 08/06/17; she is here for evaluation of her left foot ulcer. Currently being treated for hyperbarics or underlying osteomyelitis. She is completed antibiotics. The left foot ulcer is better smaller with healthier looking granulation. For various reasons I am not really clear on we never got her back to the OR with [name]. He did not respond to my secure text message. Nevertheless I think that surgery on this point is not necessary nor am I completely clear that a skin substitute is necessary The patient is complaining about pain on the outside of her right foot. 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 There is no erythema. She also states the foot is warm versus her other part of her upper leg and this is largely true. It is not totally clear to me what's causing this. She  thinks it's different from her usual neuropathy pain 08/13/17; she arrives in clinic today with a small wound which is superficial on her right first metatarsal head. 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 She tells [place] she was up on her feet over the Mother's Day celebration. The large wound is on the left foot. Continues with hyperbarics for underlying osteomyelitis. We're using Hydrofera Blue. She asked me today about where we were with Dermagraft. I had actually excluded this because of the co-pay however she wants to assume this therefore I'll recheck the co-pay an order for next week. 08/20/17; the patient agreed to accept the co-pay of the first Apligraf which we applied today. She is disappointed she is finishing hyperbarics will run this through the insurance on the extent of the foot infection and the extent of the wound that she had however she is already had 60 dive's. Dermagraft No. 1 08/27/17; Dermagraft No. 2. She is not eligible for any more hyperbaric treatments this month. She reports a fair amount of drainage and she actually changed to the external dressings without disturbing the direct contact layer 09/03/17; the patient arrived in clinic today with the wound superficially looking quite healthy. Nice vibrant red tissue with some advancing epithelialization although not as much adherence of the flap as I might like. However she noted on her own fourth toe some bogginess and she brought that to our attention. Indeed this was boggy feeling like a possibility of subcutaneous fluid. 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 She is not been unwell. We've been using Dermagraft 09/10/17; the culture that I did not last week was MRSA. She saw Dr. Megan Salon this morning who is going to start her on vancomycin. I had sent him a secure a text message yesterday. I also spoke with her podiatric surgeon  [name] about surgery on this foot the options for conserving a functional foot etc. Promised me he would see her and will make back consultation today. Paradoxically her actual wound on the plantar aspect of her left foot looks really quite good. I had given her 5 days worth of Baxdella to cover her for MRSA. Her MRI came back showing osteomyelitis within the third metatarsal shaft and head and base of the third and fourth proximal phalanx. She had extensive inflammatory changes throughout the soft tissue of the lateral forefoot. With an ill-defined fluid around the fourth metatarsal extending into the plantar and dorsal soft tissues 09/19/17; the patient is actually on oral Septra and Flagyl. She apparently refused IV vancomycin. She also saw [name] at my request who is planning her for a left BKA sometime in mid July. MRI showed osteomyelitis within the third metatarsal shaft and head and the basis of the third and fourth proximal phalanx. I believe there was felt to be possible septic arthritis involving the third MTP. 09/26/17; the patient went back to Dr. Megan Salon at my suggestion and is now receiving IV daptomycin. Her wound continues to look quite good making the decision to proceed with a transmetatarsal amputation although more difficult for the patient. I believe in my extensive discussions with her she has a good sense of the pros and cons of this. I don't NV the tuft decision she has to make. She has an appointment with [name] I believe in mid July and I previously spoken to him about this issue Has we had used 3 previous Dermagraft. Given the condition of the wound surface I went ahead and added the fourth one today area and I did this not fully realizing that she'll be traveling  to [place] next week. I'm hopeful she can come back in 2 weeks 10/21/17; Her same Dermagraft on for about 3-1/2 weeks. In spite of this the wound arrives looking quite healthy. There is been a lot of  healing dimensions are smaller. Looking at the square shaped wound she has now there is some undermining and some depth medially under the undermining although I cannot palpate any bone. No surrounding infection is obvious. She has difficult questions about how to look at this going forward vis--vis amputations versus continued medical therapy. T be truthful the wound is looks o so healthy and it is continued to contract. Hard to justify foot surgery at this point although I still told her that I think it might come to that if we are not able to eradicate the underlying MRSA. She is still highly at risk and she understands this 11/06/17 on evaluation today patient appears to be doing better in regard to her foot ulcer. She's been tolerating the dressing changes without complication. Currently she is here for her Dermagraft #6. Her wound continues to make excellent progress at this point. She does not appear to have any evidence of infection which is good news. 11/13/17 on evaluation today patient appears to be doing excellent at this time. She is here for repeat Dermagraft application. This is #7. Overall her wound seems to be making great progress. 12/05/17; the patient arrives with the wound in much better condition than when I last saw this almost 6 weeks ago. She still has a small probing area in the left metatarsal head region on the lateral aspect of her foot. We applied her last Dermagraft today. Since the last time she is here she has what appears to a been a blood blister on the plantar aspect of left foot although I don't see this is threatening. There is also a thick raised tissue on the right mid metatarsal head region. This was not there I don't think the last time she was here 3 weeks ago. 12/12/17; the patient continues to have a small programming area in the left metatarsal head region on the lateral aspect of her foot which was the initial large surgical wound. I applied her last Apligraf  last week. I'm going to use Endoform starting today Unfortunately she has an excoriated area in the left mid foot and the right mid foot. The left midfoot looks like a blistered area this was not opened last week it certainly is open today. Using silver alginate on these areas. She promises me she is offloading this. 12/19/17; the small probing area in the left metatarsal head eyes think is shallower. In general her original wound looks better. We've been using Endoform. The area inferiorly that I think was trauma last week still requires debridement a lot of nonviable surface which I removed. She still has an open open area distally in her foot Similarly on the right foot there is tightly adherent surface debris which I removed. Still areas that don't look completely epithelialized. This is a small open area. We used silver alginate on these areas 12/26/2017; the patient did not have the supplies we ordered from last week including the Endoform. The original large wound on the left lateral foot looks healthy. She still has the undermining area that is largely unchanged from last week. She has the same heavily callused raised edged wounds on the right mid and left midfoot. Both of these requiring debridement. We have been using silver alginate on these areas 01/02/2018;  there is still supply issues. We are going to try to use Prisma but I am not sure she actually got it from what she is saying. She has a new open area on the lateral aspect of the left fourth toe [previous fifth ray amputation]. Still the one tunneling area over the fourth metatarsal head. The area is in the midfoot bilaterally still have thick callus around them. She is concerned about a raised swelling on the lateral aspect of the foot. However she is completely insensate 01/10/2018; we are using Prisma to the wounds on her bilateral feet. Surprisingly the tunneling area over the left fourth metatarsal head that was part of  her original surgery has closed down. She has a small open area remaining on the incision line. 2 open areas in the midfoot. 02/10/2018; the patient arrives back in clinic after a month hiatus. She was traveling to visit family in [place]. Is fairly clear she was not offloading the areas on her feet. The original wound over the left lateral foot at the level of metatarsal heads is reopened and probes medially by about a centimeter or 2. She notes that a week ago she had purulent drainage come out of an area on the left midfoot. Paradoxically the worst area is actually on the right foot is extensive with purulent drainage. We will use silver alginate today 02/17/2018; the patient has 3 wounds one over the left lateral foot. She still has a small area over the metatarsal heads which is the remnant of her original surgical wound. This has medial probing depth of roughly 1.4 cm somewhat better than last week. The area on the right foot is larger. We have been using silver alginate to all areas. The area on the right foot and left foot that we cultured last week showed both Klebsiella and Proteus. Both of these are quinolone sensitive. The patient put her's self on Bactrim and Flagyl that she had left hanging around from prior antibiotic usages. She was apparently on this last week when she arrived. I did not realize this. Unfortunately the Bactrim will not cover either 1 of these organisms. We will send in Cipro 500 twice daily for a week 03/04/2018; the patient has 2 wounds on the left foot one is the original wound which was a surgical wound for a deep DFU. At one point this had exposed bone. She still has an area over the fourth metatarsal head that probes about 1.4 cm although I think this is better than last week. I been using silver nitrate to try and promote tissue adherence and been using silver alginate here. She also has an area in the left midfoot. This has some depth but a small linear wound.  Still requiring debridement. On the right midfoot is a circular wound. A lot of thick callus around this area. We have been using silver alginate to all wound areas She is completed the ciprofloxacin I gave her 2 weeks ago. 03/11/2018; the patient continues to have 2 open areas on the left foot 1 of which was the original surgical wound for a deep DFU. Only a small probing area remains although this is not much different from last week we have been using silver alginate. The other area is on the midfoot this is smaller linear but still with some depth. We have been using silver alginate here as well On the right foot she has a small circular wound in the mid aspect. This is not much smaller than last  time. We have been using silver alginate here as well 03/18/2018; she has 3 wounds on the left foot the original surgical wound, a very superficial wound in the mid aspect and then finally the area in the mid plantar foot. She arrives in today with a very concerning area in the wound in the mid plantar foot which is her most proximal wound. There is undermining here of roughly 1-1/2 cm superiorly. Serosanguineous drainage. She tells me she had some pain on for over the weekend that shot up her foot into her thigh and she tells me that she had a nodule in the groin area. She has the single wound in the right foot. We are using endoform to both wound areas 03/24/2018; the patient arrives with the original surgical wound in the area on the left midfoot about the same as last week. There is a collection of fluid under the surface of the skin extending from the surgical wound towards the midfoot although it does not reach the midfoot wound. The area on the right foot is about the same. Cultures from last week of the left midfoot wound showed abundant Klebsiella abundant Enterococcus faecalis and moderate methicillin resistant staph I gave her Levaquin but this would have only covered the Klebsiella. She  will need linezolid 04/01/2018; she is taking linezolid but for the first few days only took 1 a day. I have advised her to finish this at twice daily dosing. In any case all of her wounds are a lot better especially on the left foot. The original surgical wound is closed. The area on the left midfoot considerably smaller. The area on the right foot also smaller. 04/08/2018; her original surgical wound/osteomyelitis on the left foot remains closed. She has area on the left foot that is in the midfoot area but she had some streaking towards this. This is not connected with her original wound at least not visually. Small wound on the right midfoot appears somewhat smaller. 04/15/18; both wounds looks better. Original wound is better left midfoot. Using silver alginate 1/21; patient states she uses saltwater soak in, stones or remove callus from around her wounds. She is also concerned about a blood blister she had on the left foot but it simply resolved on its own. We've been using silver alginate 1/28; the patient arrives today with the same streaking area from her metatarsals laterally [the site of her original surgical wound] down to the middle of her foot. There is some drainage in the subcutaneous area here. This concerns me that there is actually continued ongoing infection in the metatarsals probably the fourth and third. This fixates an MRI of the foot without contrast [chronic renal failure] The wound in the mid part of the foot is small but I wonder whether this area actually connects with the more distal foot. The area on the right midfoot is probably about the same. Callus thick skin around the small wound which I removed with a curette we have been using silver alginate on both wound areas 2/4; culture I did of the draining site on the left foot last time grew methicillin sensitive staph aureus. MRI of the left foot showed interval resolution of the findings surrounding the third metatarsal  joint on the prior study consistent with treated osteomyelitis. Chronic soft tissue ulceration in the plantar and lateral aspect of the forefoot without residual focal fluid collection. No evidence of recurrent osteomyelitis. Noted to have the previous amputation of the distal first phalanx and fifth  ray MRI of the right foot showed no evidence of osteomyelitis I am going to treat the patient with a prolonged course of antibiotics directed against MSSA in the left foot 2/11; patient continues on cephalexin. She tells me she had nausea and vomiting over the weekend and missed 2 days. In general her foot looks much the same. She has a small open area just below the left fourth metatarsal head. A linear area in the left midfoot. Some discoloration extending from the inferior part of this into the left lateral foot although this appears to be superficial. She has a small area on the right midfoot which generally looks smaller after debridement 2/18; the patient is completing his cephalexin and has another 2 days. She continues to have open areas on the left and right foot. 2/25; she is now off antibiotics. The area on the left foot at the site of her original surgical wound has closed yet again. She still has open areas in the mid part of her foot however these appear smaller. The area on the right mid foot looks about the same. We have been using silver alginate She tells me she had a serious hypoglycemic spell at home. She had to have EMS called and get IV dextrose 3/3; disappointing on the left lateral foot large area of necrotic tissue surrounding the linear area. This appears to track up towards the same original surgical wound. Required extensive debridement. The area on the right plantar foot is not a lot better also using silver 3/12; the culture I did last time showed abundant enterococcus. I have prescribed Augmentin, should cover any unrecognized anaerobes as well. In addition there were a few  MRSA and Serratia that would not be well covered although I did not want to give her multiple antibiotics. She comes in today with a new wound in the right midfoot this is not connected with the original wound over her MTP a lot of thick callus tissue around both wounds but once again she said she is not walking on these areas 3/17-Patient comes in for follow-up on the bilateral plantar wounds, the right midfoot and the left plantar wound. Both these are heavily callused surrounding the wounds. We are continuing to use silver alginate, she is compliant with offloading and states she uses a wheelchair fairly often at home 3/24; both wound areas have thick callus. However things actually look quite a bit better here for the majority of her left foot and the right foot. 3/31; patient continues to have thick callused somewhat irritated looking tissue around the wounds which individually are fairly superficial. There is no evidence of surrounding infection. We have been using silver alginate however I change that to [facility] today 4/17; patient returns to clinic after having a scare with Covid she tested negative in her primary doctor's office. She has been using Hydrofera Blue. She does not have an open area on the right foot. On the left foot she has a small open area with the mid area not completely viable. She showed me pictures of what looks like a hemorrhagic blister from several days ago but that seems to have healed over this was on the lateral left foot 4/21; patient comes in to clinic with both her wounds on her feet closed. However over the weekend she started having pain in her right foot and leg up into the thigh. She felt as though she was running a low-grade fever but did not take her temperature. She took a  doxycycline that she had leftover and yesterday a single Septra and metronidazole. She thinks things feel somewhat better. 4/28; duplex ultrasound I ordered last week was negative  for DVT or superficial thrombophlebitis. She is completed the doxycycline I gave her. States she is still having a lot of pain in the right calf and right ankle which is no better than last week. She cannot sleep. She also states she has a temperature of up to 101, coughing and complaining of visual loss in her bilateral eyes. Apparently she was tested for Covid 2 weeks ago at [facility] and that was negative. Readmission: 09/03/18 patient presents back for reevaluation after having been evaluated at the end of April regarding erythema and swelling of her right lower extremity. Subsequently she ended up going to the hospital on 07/29/18 and was admitted not to be discharged until 08/08/18. Unfortunately it was noted during the time that she was in the hospital that she did have methicillin-resistant Staphylococcus aureus as the infection noted at the site. It was also determined that she did have osteomyelitis which appears to be fairly significant. She was treated with vancomycin and in fact is still on IV vancomycin at dialysis currently. This is actually slated to continue until 09/12/18 at least which will be the completion of the six weeks of therapy. Nonetheless based on what I'm seeing at this point I'm not sure she will be anywhere near ready to discontinue antibiotics at that time. Since she was released from the hospital she was seen by [name] who is her podiatrist on 08/27/18. His note specifically states that he is recommended that the patient needs of one knee amputation on the right as she has a life- threatening situation that can lead quickly to sepsis. The patient advised she would like to try to save her leg to which [name] apparently told her that this was against all medical advice. She also want to discontinue the Wound VAC which had been initiated due to the fact that she wasn't pleased with how the wound was looking and subsequently she wanted to pursue applying Medihoney at that time.  He stated that he did not believe that the right lower extremity was salvageable and that the patient understood but would still like to attempt hyperbaric option therapy if it could be of any benefit. She was therefore referred back to [place] for further evaluation. He plans to see her back next week. Upon inspection today patient has a significant amount purulent drainage noted from the wound at this point. The bone in the distal portion of her foot also appears to be extremely necrotic and spongy. When I push down on the bone it bubbles and seeps purulent drainage from deeper in the end of the foot. I do not think that this is likely going to heal very well at all and less aggressive surgical debridement were undertaken more than what I believe we can likely do here in our office. 09/12/2018; I have not seen this patient since the most recent hospitalization although she was in our clinic last week. I have reviewed some of her records from a complex hospitalization. 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 There is situation was complicated by MRSA bacteremia and acute on chronic renal failure now on dialysis. She is receiving vancomycin at dialysis. We started her on Dakin's wet-to-dry last week she is changing this daily. There is still purulent drainage coming out of her foot.  Although she is apparently "agreeable" to a below-knee amputation which is been suggested by multiple clinicians she wants this to be done in [place]. She apparently has a telehealth visit with that provider sometime in late Tye 6/24. I have told her I think this is probably too long. Nevertheless I could not convince her to allow a local doctor to perform BKA. 09/19/2018; the patient has a large necrotic area on the right anterior foot. She has had previous transmetatarsal amputations. Culture I did last week showed MRSA nothing else she is on vancomycin at dialysis. She  has continued leaking purulent drainage out of the distal part of the large circular wound on the right anterior foot. She apparently went to see Dr. 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 Somehow that translated into her being referred to plastic surgery for debridement of the area. I gather she basically refused amputation although I do not have a copy of Dr. Berenice Primas notes. The patient really wants to have a trial of hyperbaric oxygen. I agreed with initial assessment in this clinic that this was probably too far along to benefit however if she is going to have plastic surgery I think she would benefit from ancillary hyperbaric oxygen. The issue here is that the patient has benefited as maximally as any patient I have ever seen from hyperbaric oxygen therapy. Most recently she had exposed bone on the lateral part of her left foot after a surgical procedure and that actually has closed. She has eschared areas in both heels but no open area. She is remained systemically well. I am not optimistic that anything can be done about this but the patient is very clear that she wants an attempt. The attempt would include a wound VAC further debridements and hyperbaric oxygen along with IV antibiotics. 6/26; I put her in for a trial of hyperbaric oxygen only because of the dramatic response she has had with wounds on her left midfoot earlier this year which was a surgical wound that went straight to her bone over the metatarsal heads and also remotely the left third toe. We will see if we can get this through our review process and insurance. She arrives in clinic with again purulent material pouring out of necrotic bone on the top of the foot distally. There is also some concerning erythema on the front of the leg that we marked. It is bit difficult to tell how tender this is because of neuropathy. I note from infectious disease that she had her vancomycin extended. All the  cultures of these areas have shown MRSA sensitive to vancomycin. She had the wound VAC on for part of the week. The rest of the time she is putting various things on this including Medihoney, "ionized water" silver sorb gel etc. 7/7; follow-up along with HBO. She is still on vancomycin at dialysis. She has a large open area on the dorsal right foot and a small dark eschar area on her heel. There is a lot less erythema in the area and a lot less tenderness. From an infection point of view I think this is better. She still has a lot of necrosis in the remaining right forefoot [previous TMA] we are still using the wound VAC in this area 7/16; follow-up along with HBO. I put her on linezolid after she finished her vancomycin. We started this last Friday I gave her 2 weeks worth. I had the expectation that she would be operatively debrided by Dr. Marla Roe  but that still has not happened yet. Patient phoned the office this week. She arrives for review today after HBO. The distal part of this wound is completely necrotic. Nonviable pieces of tendon bone was still purulent drainage. Also concerning that she has black eschar over the heel that is expanding. I think this may be indicative of infection in this area as well. She has less erythema and warmth in the ankle and calf but still an abnormal exam 7/21 follow-up along with HBO. I will renew her linezolid after checking a CBC with differential monitoring her blood counts especially her platelets. She was supposed to have surgery yesterday but if I am reading things correctly this was canceled after her blood sugar was found to be over 500. I thought Dr. Marla Roe who called me said that they were sending her to the ER but the patient states that was not the case. 7/28. Follow-up along with HBO. She is on linezolid I still do not have any lab work from dialysis even though I called last week. The patient is concerned about an area on her left lateral  foot about the level of the base of her fifth metatarsal. I did not really see anything that ominous here however this patient is in [place] ability to point out problems that she is sensing and she has been accurate in the past Finally she received a call from Dr. Marla Roe who is referring her to another orthopedic surgeon stating that she is too booked up to take her to the operating room now. Was still using a wound VAC on the foot 8/3 -Follow-up after HBO, she is got another week of linezolid, she is to call ID for an appointment, x-rays of both feet were reviewed, the left foot x-ray with third MTP joint osteo- Right foot x-ray widespread osteo-in the right midfoot Right ankle x-ray does not show any active evidence of infection 8/11-Patient is seen after HBO, the wounds on the right foot appear to be about the same, the heel wound had some necrotic base over tendon that was debrided with a curette 8/21; patient is seen after HBO. The patient's wound on her dorsal foot actually looks reasonably good and there is substantial amount of epithelialization however the open area distally still has a lot of necrotic debris partially bone. I cannot really get a good sense of just how deep this probes under the foot. She has been pressuring me this week to order medical maggots through a company in [place] for her. The problem I have is there is not a defined wound area here. On the positive side there is no purulence. She has been to see infectious disease she is still on Septra DS although I have not had a chance to review their notes 8/28; patient is seen in conjunction with HBO. The wounds on her foot continued to improve including the right dorsal foot substantially the, the distal part of this wound and the area on the right heel. We have been using a wound VAC over this chronically. She is still on trimethoprim as directed by infectious disease 9/4; patient is seen in conjunction with HBO.  Right dorsal foot wound substantially anteriorly is better however she continues to have a deep wound in the distal part of this that is not responding. We have been using silver collagen under border foam Area on the right plantar medial heel seems better. We have been using Hydrofera Blue 12/12/18 on evaluation today patient appears to be  doing about the same with regard to her wound based on prior measurements. She does have some necrotic tissue noted on the lateral aspect of the wound that is going require a little bit of sharp debridement today. This includes what appears to be potentially either severely necrotic bone or tendon. Nonetheless other than that she does not appear to have any severe infection which is good news 9/18; it is been 2 weeks since I saw this wound. She is tolerating HBO well. Continued dramatic improvement in the area on the right dorsal foot. She still has a small wound on the heel that we have been using Hydrofera Blue. She continues with a wound VAC 9/24; patient has to be seen emergently today with a swelling on her right lateral lower leg. She says that she told Dr. Evette Doffing about this and also myself on a couple of occasions but I really have no recollection of this. She is not systemically unwell and her wound really looked good the last time I saw this. She showed this to providers at dialysis and she was able to verify that she was started on cephalexin today for 5 doses at dialysis. She dialyzes on Tuesday Thursday and Saturday. 10/2; patient is seen in conjunction with HBO. The area that is draining on the right anterior medial tibia is more extensive. Copious amounts of serosanguineous drainage with some purulence. We are still using the wound VAC on the original wound then it is stable. Culture I did of the original IandD showed MRSA I contacted dialysis she is now on vancomycin with dialysis treatments. I asked them to run a month 10/9; patient seen in  conjunction with HBO. She had a new spontaneous open area just above the wound on the right medial tibia ankle. More swelling on the right medial tibia. Her wound on the foot looks about the same perhaps slightly better. There is no warmth spreading up her leg but no obvious erythema. her MRI of the foot and ankle and distal tib-fib is not booked for next Friday I discussed this with her in great detail over multiple days. it is likely she has spreading infection upper leg at least involving the distal 25% above the ankle. She knows that if I refer her to orthopedics for infectious disease they are going to recommend amputation and indeed I am not against this myself. We had a good trial at trying to heal the foot which is what she wanted along with antibiotics debridement and HBO however she clearly has spreading infection [probably staph aureus/MRSA]. Nevertheless she once again tells me she wants to wait the left of the MRI. She still makes comments about having her amputation done in [place]. 10/19; arrives today with significant swelling on the lateral right leg. Last culture I did showed Klebsiella. Multidrug-resistant. Cipro was intermediate sensitivity and that is what I have her on pending her MRI which apparently is going to be done on Thursday this week although this seems to be moving back and forth. She is not systemically unwell. We are using silver alginate on her major wound area on the right medial foot and the draining areas on the right lateral lower leg 10/26; MRI showed extensive abscess in the anterior compartment of the right leg also widespread osteomyelitis involving osseous structures of the midfoot and portions of the hindfoot. Also suspicion for osteomyelitis anterior aspect of the distal medial malleolus. Culture I did of the purulence once again showed a multidrug-resistant Klebsiella. I have been  in contact with nephrology late last week and she has been started on  cefepime at dialysis to replace the vancomycin We sent a copy of her MRI report to Dr. Geroge Baseman in [place] who is an orthopedic surgeon. The patient takes great stock in his opinion on this. She says she will go to [place] to have her leg amputated if Dr. Geroge Baseman does not feel there is any salvage options. 11/2; she still is not talk to her orthopedic surgeon in [place]. Apparently he will call her at [phone] this afternoon. The quality of this is she has not allowed me to refer her anywhere. She has been told over and over that she needs this amputated but has not agreed to be referred. She tells me her blood sugar was 600 last night but she has not been febrile. 11/9; she never did got a call from the orthopedic surgeon in [place] therefore that is off the radar. We have arranged to get her see orthopedic surgery at [facility]. She still has a lot of draining purulence coming out of the new abscess in her right leg although that probably came from the osteomyelitis in her right foot and heel. Meanwhile the original wound on the right foot looks very healthy. Continued improvement. The issue is that the last MRI showed osteomyelitis in her right foot extensively she now has an abscess in the right anterior lower leg. There is nobody in [place] who will offer this woman anything but an amputation and to be honest that is probably what she needs. I think she still wants to talk about limb salvage although at this point I just do not see that. She has completed her vancomycin at dialysis which was for the original staph aureus she is still on cefepime for the more recent Klebsiella. She has had a long course of both of these antibiotics which should have benefited the osteomyelitis on the right foot as well as the abscess. 11/16; apparently [place] elective surgery is shut down because of COVID-19 pandemic. I have reached out to some contacts at [facility] to see if we can get her an  orthopedic appointment there. I am concerned about continually leaving this but for the moment everything is static. In fact her original large wound on this foot is closing down. It is the abscess on the right anterior leg that continues to drain purulent serosanguineous material. She is not currently on any antibiotics however she had a prolonged course of vancomycin [1 month] as well as cefepime for a month 02/24/2019 on evaluation today patient appears to be doing better than the last time I saw her. This is not a patient that I typically see. With that being said I am covering for Dr. Dellia Nims this week and again compared to when I last saw her overall the wounds in particular seem to be doing significantly better which is good news. With that being said the patient tells me several disconcerting things. She has not been able to get in to see anyone for potential debridement in regard to her leg wounds although she tells me that she does not think it is necessary any longer because she is taking care of that herself. She noticed a string coming out of the lower wound on her leg over the last week. The patient states that she subsequently decided that we must of pack something in there and started pulling the string out and as it kept coming and coming she realized this was likely her  tendon. With that being said she continued to remove as much of this as she could. She then I subsequently proceeded to using tubes of antibiotic ointment which she will stick down into the wound and then scored as much as she can until she sees it coming out of the other wound opening. She states that in doing this she is actually made things better and there is less redness and irritation. With regard to her foot wound she does have some necrotic tendon and tissue noted in one small corner but again the actual wound itself seems to be doing better with good granulation in general compared to my last evaluation. 12/7;  continued improvement in the wound on the substantial part of the right medial foot. Still a necrotic area inferiorly that required debridement but the rest of this looks very healthy and is contracting. She has 2 wounds on the right lateral leg which were her original drainage sites from her abscess but all of this looks a lot better as well. She has been using silver alginate after putting antibiotic biotic ointment in one wound and watching it come out the other. I have talked to her in some detail today. I had given her names of orthopedic surgeons at [facility] for second opinion on what to do about the right leg. I do not think the patient never called them. She has not been able to get a hold of the orthopedic surgeon in [place] that she had put a lot of faith in as being somebody would give her an opinion that she would trust. I talked to her today and said even if I could get her in to another orthopedic surgeon about the leg which she accept an amputation and she said she would not therefore I am not going to press this issue for the moment 12/14; continued improvement in his substantial wound on the right medial foot. There is still a necrotic area inferiorly with tightly adherent necrotic debris which I have been working on debriding each time she is here. She does not have an orthopedic appointment. Since last time she was here I looked over her cultures which were essentially MRSA on the foot wound and gram-negative rods in the abscess on the anterior leg. 12/21; continued improvement in the area on the right medial foot. She is not up on this much and that is probably a good thing since I do not know it could support continuous ambulation. She has a small area on the right lateral leg which were remanence of the IandD's I did because of the abscess. I think she should probably have prophylactic antibiotics I am going to have to look this over to see if we can make an intelligent  decision here. In the meantime her major wound is come down nicely. Necrotic area inferiorly is still there but looks a lot better 04/06/2019; she has had some improvement in the overall surface area on the right medial foot somewhat narrowedr both but somewhat longer. The areas on the right lateral leg which were initial IandD sites are superficial. Nothing is present on the right heel. We are using silver alginate to the wound areas 1/18; right medial foot somewhat smaller. Still a deep probing area in the most distal recess of the wound. She has nothing open on the right leg. She has a new wound on the plantar aspect of her left fourth toe which may have come from just pulling skin. The patient using Medihoney on  the wound on her foot under silver alginate. I cannot discourage her from this 2/1; 2-week follow-up using silver alginate on the right foot and her left fourth toe. The area on the right dorsal foot is contracted although there is still the deep area in the most distal part of the wound but still has some probing depth. No overt infection 2/15; 2-week follow-up. She continues to have improvement in the surface area on the dorsal right foot. Even the tunneling area from last time is almost closed. The area that was on the plantar part of her left fourth toe over the PIP is indeed closed 3/1; 2-week follow-up. Continued improvement in surface area. The original divot that we have been debriding inferiorly I think has full epithelialization although the epithelialization is gone down into the wound with probably 4 mm of depth. Even under intense illumination I am unable to see anything open here. The remanence of the wound in this area actually look quite healthy. We have been using silver alginate 3/15; 2-week follow-up. Unfortunately not as good today. She has a comma shaped wound on the dorsal foot however the upper part of this is larger. Under illumination debris on the surface She also  tells [place] that she was on her right leg 2 times in the last couple of weeks mostly to reach up for things above her head etc. She felt a sharp pain in the right leg which she thinks is somewhere from the ankle to the knee. The patient has neuropathy and is really uncertain. She cannot feel her foot so she does not think it was coming from there 3/29; 2-week follow-up. Her wound measures smaller. Surface of the wound appears reasonable. She is using silver alginate with underlying Medihoney. She has home health. X-rays I did of her tib-fib last time were negative although it did show arterial calcification 4/12; 2-week follow-up. Her wound measures smaller in length. Using manuka honey with silver alginate on top. She has home health. 4/26; 2-week follow-up. Her wound is smaller but still very adherent debris under illumination requiring debridement she has been using manuka honey with silver alginate. 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 Been using Hydrofera Blue. She is on suppressive Bactrim but apparently she has been taking it twice daily 6/7; I have not seen this wound and about 6 weeks. Since then she was up in [place]. By her own admission she was walking on the foot because she did not have a wheelchair. The wound is not nearly as healthy looking as it was the last time I saw this. We ordered different things for her but she only uses Medihoney and silver alginate. As far as I know she is on suppressive trimethoprim sulfamethoxazole. She does not admit to any fever or chills. Her CBGs apparently are at baseline however she is saying that she feels some discomfort on the lateral part of her ankle I looked over her last inflammatory markers from the summer 2020 at which time she had a deeply necrotic infected wound in this area. On 11/10/2018 her sedimentation rate was 56 and C-reactive  protein 9.9. This was 107 and 29 on 07/29/2018. 6/17; the patient had a necrotic wound the last time she was here on the right dorsal foot. After debridement I did a culture. This showed a very resistant ESBL Klebsiella as well as Enterococcus. Her  x-ray of the foot which was done because of warmth and some discomfort showed bone destruction within the carpal bones involving the navicular acute cuboid lateral middle cuneiforms but essentially unchanged from her prior study which was done on 10/29/2018. The findings were felt to represent chronic osteomyelitis. We did inflammatory markers on her. Her white count was 5.25 sedimentation rate 16 and C-reactive protein at 11.1. Notable for the fact that in August 2020 her CRP was 9.9 and sedimentation rate 56. I have looked at her x-rays. It is true that the bone destruction is very impressive however the patient came into this clinic for the wound on her right foot with pieces of bone literally falling out anteriorly with purulent material. I am not exactly sure I could have expected anything different. She has not been systemically unwell no fever chills or blood sugars have been reasonable. 6/28; she arrives with a right heel closed. The substantial area on the right anterior foot looks healthy. Much better looking surface. I think we can change to [facility] seems to help this previously. She is getting her antibiotics at dialysis she should be just about finished 7/9; changed to [facility] last week. Surface wound looks satisfactory not much change in surface area however. She is going to [place] next week this is usually a difficult thing for this patient follow-up will be for 2 weeks. Electronic Signature(s) Signed: 10/13/2019 8:12:39 AM By: Linton Ham MD Entered By: Linton Ham on 10/09/2019 13:56:17 -------------------------------------------------------------------------------- Physical Exam Details Patient Name: Date of  Service: [name] 10/09/2019 1:15 PM Medical Record Number: [id number] Patient Account Number: [id number] Date of Birth/Sex: Treating RN: [birth year] (49 y.o. [name] Primary Care Provider: [name], [name] Other Clinician: Referring Provider: Treating Provider/Extender: [name], [name] Weeks in Treatment: 19 Constitutional Patient is hypertensive.. Pulse regular and within target range for patient.Marland Kitchen Respirations regular, non-labored and within target range.. Temperature is normal and within the target range for the patient.Marland Kitchen Appears in no distress. Respiratory work of breathing is normal. Cardiovascular Peripheral pulses are palpable. Notes Wound exam Right forefoot wound looks clean granulated but not much change in surface area. Small area of epithelialization I think is similar to last week. Electronic Signature(s) Signed: 10/13/2019 8:12:39 AM By: Linton Ham MD Entered By: Linton Ham on 10/09/2019 13:59:39 -------------------------------------------------------------------------------- Physician Orders Details Patient Name: Date of Service: [name] 10/09/2019 1:15 PM Medical Record Number: [id number] Patient Account Number: [id number] Date of Birth/Sex: Treating RN: 1970-12-12 (49 y.o. F) Dwiggins, Shannon Primary Care Provider: [name], [name] Other Clinician: Referring Provider: Treating Provider/Extender: [name], [name] Weeks in Treatment: 86 Verbal / Phone Orders: No Diagnosis Coding ICD-10 Coding Code Description M86.671 Other chronic osteomyelitis, right ankle and foot L97.514 Non-pressure chronic ulcer of other part of right foot with necrosis of bone L97.411 Non-pressure chronic ulcer of right heel and midfoot limited to breakdown of skin E10.621 Type 1 diabetes mellitus with foot ulcer Follow-up Appointments Return Appointment in 2 weeks. Dressing Change Frequency Wound #43  Right,Medial Foot Change Dressing every other day. Wound Cleansing Clean wound with Wound Cleanser - or normal saline Primary Wound Dressing Wound #43 Right,Medial Foot Hydrofera Blue - Classic Secondary Dressing Wound #43 Right,Medial Foot Kerlix/Rolled Gauze Dry Gauze Edema Control Elevate legs to the level of the heart or above for 30 minutes daily and/or when sitting, a frequency of: - throughout the day  [place] skilled nursing for wound care. - Interim Electronic Signature(s) Signed: 10/09/2019 5:57:27 PM By: [name] Signed: 10/13/2019 8:12:39 AM By: Linton Ham MD Entered By: [name] on 10/09/2019 13:49:54 -------------------------------------------------------------------------------- Problem List Details Patient Name: Date of Service: [name] 10/09/2019 1:15 PM Medical Record Number: [id number] Patient Account Number: [id number] Date of Birth/Sex: Treating RN: 10/02/1970 (49 y.o. F) Dwiggins, Shannon Primary Care Provider: [name], [name] Other Clinician: Referring Provider: Treating Provider/Extender: [name], [name] Weeks in Treatment: 22 Active Problems ICD-10 Encounter Code Description Active Date MDM Diagnosis M86.671 Other chronic osteomyelitis, right ankle and foot 09/03/2018 No Yes L97.514 Non-pressure chronic ulcer of other part of right foot with necrosis of bone 09/03/2018 No Yes L97.411 Non-pressure chronic ulcer of right heel and midfoot limited to breakdown of 09/17/2019 No Yes skin E10.621 Type 1 diabetes mellitus with foot ulcer 09/24/2018 No Yes Inactive Problems ICD-10 Code Description Active Date Inactive Date L97.521 Non-pressure chronic ulcer of other part of left foot limited to breakdown of skin 04/20/2019 04/20/2019 L97.812 Non-pressure chronic ulcer of other part of right lower leg with fat layer exposed 02/24/2019 02/24/2019 Resolved Problems ICD-10 Code  Description Active Date Resolved Date L02.415 Cutaneous abscess of right lower limb 12/25/2018 12/25/2018 Electronic Signature(s) Signed: 10/13/2019 8:12:39 AM By: Linton Ham MD Entered By: Linton Ham on 10/09/2019 13:54:47 -------------------------------------------------------------------------------- Progress Note Details Patient Name: Date of Service: [name] 10/09/2019 1:15 PM Medical Record Number: [id number] Patient Account Number: [id number] Date of Birth/Sex: Treating RN: 06/26/1970 (49 y.o. [name] Primary Care Provider: [name], [name] Other Clinician: Referring Provider: Treating Provider/Extender: [name], [name] Weeks in Treatment: 90 Subjective History of Present Illness (HPI) 49 year old diabetic who is known to have type 1 diabetes which is poorly controlled last hemoglobin A1c was 11%. She comes in with a ulcerated area on the left lateral foot which has been there for over 6 months. Was recently she has been treated by [name] of podiatry who saw her last on 05/28/2016. Review of his notes revealed that the patient had incision and drainage with placement of antibiotic beads to the left foot on 04/11/2016 for possible osteomyelitis of the cuboid bone. Over the last year she's had a history of amputation of the left fifth toe and a femoropopliteal popliteal bypass graft somewhere in April 2017. 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 His note [name] mentions that the patient has been referred to me for further wound care and possibly great candidate for hyperbaric oxygen therapy due to recurrent osteomyelitis. However we do not have any x-rays of biopsy reports confirming this. He has been on several antibiotics including Bactrim and most recently is on doxycycline for an MRSA. I understand, the patient was not a candidate for IV antibiotics as she has had previous PICC lines which resulted in  blood clots in both arms. There was a x-ray report dated 04/04/2016 on [name] notes which showed evidence of fifth ray resection left foot with osteolytic changes noted to the fourth metatarsal and cuboid bone on the left. 06/13/2016 -- had a left foot x-ray which showed no acute fracture or dislocation and no definite radiographic evidence of osteomyelitis. Advanced osteopenia was seen. 06/20/2016 -- she has noticed a new wound on the right plantar foot in the region where she had a callus before. 06/27/16- the patient did have  her x-ray of the right foot which showed no findings to suggest osteomyelitis. She saw her endocrinologist, [name], yesterday. Her A1c in January was 11. He also indicates mismanagement and noncompliance regarding her diabetes. She is currently on Bactrim for a lip infection. She is complaining of nausea, vomiting and diarrhea. She is unable to articulate the exact orders or dosing of the Bactrim; it is unclear when she will complete this. 07/04/2016 -- results from [facility] of ABIs with ankle waveforms were noted from 02/14/2016. The examination done on 06/27/2015 showed noncompressible ABIs with the right being 1.45 and the left being 1.33. The present examination showed a right ABI of 1.19 on the left of 1.33. The conclusion was that right normal ABI in the lower extremity at rest however compared to previous study which was noncompressible ABI may be falsely elevated side suggesting medial calcification. The left ABI suggested medial calcification. 08/01/2016 -- the patient had more redness and pain on her right foot and did not get to come to see as noted she see her PCP or go to the ER and decided to take some leftover metronidazole which she had at home. As usual, the patient does report she feels and is rather noncompliant. 08/08/2016 -- -- x-ray of the right foot -- FINDINGS:Transmetatarsal amputation is noted. No bony destruction is noted to suggest  osteomyelitis. IMPRESSION: No evidence of osteomyelitis. Postsurgical changes are seen. MRI would be more sensitive for possible bony changes. Culture has grown Serratia Marcescens -- sensitive to Bactrim, ciprofloxacin, ceftazidime she was seen by [name] on 08/06/2016. He did not find any exposed bone, muscle, tendon, ligament or joint. There was no malodor and he did a excisional debridement in the office. ============ Old notes: 49 year old patient who is known to the wound clinic for a while had been away from the wound clinic since 09/01/2014. Over the last several months she has been admitted to various hospitals including [facility] [place]. She was treated for a right metatarsal osteomyelitis with a transmetatarsal amputation and this was done about 2 months ago. He has a small ulcerated area on the right heel and she continues to have an ulcerated area on the left plantar aspect of the foot. The patient was recently admitted to the [facility] hospital group between 7/12 and 10/18/2014. she was given 3 weeks of IV vancomycin and was to follow-up with her surgeons at [facility] and also took oral vancomycin for C. difficile colitis. 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 MRI of the left foot done on 10/14/2014 showed no abscess or osteomyelitis. 04/27/15; this is a patient we know from previous stays in the wound care center. She is a type I diabetic I am not sure of her control currently. Since the last time I saw her she is had a right transmetatarsal amputation and has no wounds on her right foot and has no open wounds. She is been followed at the wound care center at [facility] in  [place]. She comes today with the desire to undergo hyperbaric treatment locally. Apparently one of her wound care providers in [place] has suggested hyperbarics. This is in response to an MRI from 04/18/15 that showed increased marrow signal and loss of the proximal fifth metatarsal cortex evidence of osteomyelitis with  likely early osteomyelitis in the cuboid bone as well. She has a large wound over the base of the fifth metatarsal. She also has a eschar over her the tips of her toes on 1,3 and 5. She does not have peripheral pulses and apparently is going for an angiogram tomorrow which seems reasonable. After this she is going to infectious disease at [facility]. They have been using Medihoney to the large wound on the lateral aspect of the left foot to. The patient has known Charcot deformity from diabetic neuropathy. She also has known diabetic PAD. Surprisingly I can't see that she has had any recent antibiotics, the patient states the last antibiotic she had was at the end of November for 10 days. I think this was in response to culture that showed group G strep although I'm not exactly sure where the culture was from. She is also had arterial studies on 03/29/15. This showed a right ABI of 1.4 that was noncompressible. Her left ABI was 0.73. 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 It was not felt that arterial inflow was adequate for healing of a foot ulcer. Her Doppler waveforms looked monophasic ===== READMISSION 02/28/17; this is in an now 49 year old woman we've had at several different occasions in this clinic. 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 She has a remote ex-smoker. She was last seen in this clinic by Dr. Con Memos I think in May. More recently she is been followed by her podiatrist [name] an infectious disease Dr. Megan Salon. She has 2 open wounds the major one is over the right first metatarsal  head she also has a wound on the left plantar foot. an MRI of the right foot on 01/01/17 showed a soft tissue ulcer along the plantar aspect of the first metatarsal base consistent with osteomyelitis of the first metatarsal stump. Dr. 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 According to the patient this is been open for slightly over a month. She has been on a combination of Cipro 500 twice a day, Zyvox 600 twice a day and Flagyl 500 3 times a day for over a month now as directed by Dr. Megan Salon. cultures of the right foot earlier this year showed MRSA in January and Serratia in May. January also had a few viridans strep. Recent x-rays of both feet were done and [name] office and I don't have these reports. The patient has known PAD and has a history of aleft femoropopliteal bypass in April 2017. 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 Dwyane Dee. In spite of this the last hemoglobin A1c I can see is 10.1 on 01/01/2017. She is being referred by 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 She is been using Medihoney to this area and also an area on the left midfoot. She is using healing sandals bilaterally. ABIs in this clinic at the left posterior tibial was 1.1 noncompressible on the right READMISSION Non invasive vascular NOVANT 5/18 Aftercare following surgery of the circulatory system Procedure Note - Interface, External Ris In - 08/13/2016 11:05 AM EDT Procedure: Examination consists of physiologic resting arterial pressures of the brachial and ankle arteries bilaterally with  continuous wave Doppler waveform analysis. Previous: Previous exam performed on 02/14/16 demonstrated  ABIs of Rt = 1.19 and Lt = 1.33. Right: ABI = non-compressible PT 1.47 DP. S/P transmet amputation. , Left: ABI = 1.52, 2nd digit pressure = 87 mmHg Conclusions: Right: ABI (>1.3) may be falsely elevated, suggesting medial calcification. Left: ABI (>1.3) may be falsely elevated, suggesting medial calcification The patient is a now 49 year old type I diabetic is had multiple issues her graded to chronic diabetic foot ulcers. 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 We had her in the clinic lastin November. At that point she had wounds on her bilateral feet.she had wanted to try hyperbarics however the healogics review process denied her because she hadn't followed up with her vascular surgeon for her left femoropopliteal bypass. The bypass was done by Dr. Raul Del at [facility]. We made her a follow-up with Dr. Raul Del however she did not keep the appointment and therefore she was not approved The patient shows me a small wound on her left fourth metatarsal head on her phone. She developed rapid discoloration in the plantar aspect of the left foot and she was admitted to hospital from 2/2 through 05/10/17 with wet gangrene of the left foot osteomyelitis of the fourth metatarsal heads. She was admitted acutely ill with a temperature of 103. She was started on broad-spectrum vancomycin and cefepime. On 05/06/17 she was taken to the OR by [name] her podiatric surgeon for an incision and drainage irrigation of the left foot wound. Cultures from this surgery revealed group be strep and anaerobes. she was seen by Dr.Xu of orthopedic surgery and scheduled for a below-knee amputation which she u refused. Ultimately she was discharged on Levaquin and Flagyl for one month. MRI 05/05/17 done while she was in the hospital showed abscess adjacent to the fourth metatarsal head and neck small abscess around the fourth flexor  tendon. Inflammatory phlegmon and gas in the soft tissues along the lateral aspect of the fourth phalanx. Findings worrisome for osteomyelitis involving the fourth proximal and middle phalanx and also the third and fourth metatarsals. Finally the patient had actually shortly before this followed up with Dr. Raul Del at no time on 04/29/17. He felt that her left femoropopliteal bypass was patent he felt that her left-sided toe pressures more than adequate for healing a wound on the left foot. This was before her acute presentation. 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 05/28/17; she is started hyperbarics. The patient tells me that for some reason she was not actually on Levaquin but I think on ciprofloxacin. She was on Flagyl. She only started her Levaquin yesterday due to some difficulty with the pharmacy and perhaps her sister picking it up. She has an appointment with [name] tomorrow and with infectious disease early next week. She has no new complaints 06/06/17; the patient continues in hyperbarics. She saw [name] on 05/29/17 who is her podiatric surgeon. He is elected for a transmetatarsal amputation on 06/27/17. I'm not sure at what level he plans to do this amputation. The patient is unaware ooShe also saw Dr. Megan Salon of infectious disease who elected to continue her on current antibiotics I think this is ciprofloxacin and Flagyl. I'll need to clarify with her tomorrow if she actually has this. We're using silver alginate to the actual wound. Necrotic surface today with material under the flap of her foot. ooOriginal MRI showed abscesses as well as osteomyelitis of the  proximal and middle fourth phalanx and the third and fourth metatarsal heads 06/11/17; patient continues in hyperbarics and continues on oral antibiotics. She is doing well. The wound looks better. The necrotic part of this under the flap in her superior foot also looks better. she is been to see [name]. I haven't had a  chance to look at his note. Apparently he has put the transmetatarsal amputation on hold her request it is still planning to take her to the OR for debridement and product application ACEL. I'll see if I can find his note. I'll therefore leave product ordering/requests to [name] for now. I was going to look at Dermagraft 06/18/17-she is here in follow-up evaluation for bilateral foot wounds. She continues with hyperbaric therapy. She states she has been applying manuka honey to the right plantar foot and alternate manuka honey and silver alginate to the left foot, despite our orders. We will continue with same treatment plan and she will follo up next week. 06/25/17; I have reviewed [name] last note from 3/11. She has operative debridement in 2 days' time. By review his note apparently they're going to place there is skin over the majority of this wound which is a good choice. She has a small satellite area at the most proximal part of this wound on the left plantar foot. The area on the right plantar foot we've been using silver alginate and it is close to healing. 07/02/17; unfortunately the patient was not easily approved for [name] proposed surgery. I'm not completely certain what the issue is. She has been using silver alginate to the wound she has completed a first course of hyperbarics. She is still on Levaquin and Flagyl. I have really lost track of the time course here.I suspect she should have another week to 2 of antibiotics. I'll need to see if she is followed up with infectious disease Dr. Megan Salon 07/09/17; the patient is followed up with Dr. Megan Salon. 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 She continues on Levaquin and metronidazole continuing both of these for now I think she is been on fr about 6 weeks. She still has some drainage but no pain. No fever. Her had been plans for her to go to the OR for operative debridement with her  podiatrist [name], I am not exactly sure where that is. I'll probably slip a note to [name] today. I note that she follows with Dr. Dwyane Dee of endocrinology. We have her recertified for hyperbaric oxygen. I have not heard about Dermagraft however I'll see if [name] is planning a skin substitute as well 07/16/17; the patient tells me she is just about out of [place]. I'll need to check Dr. Hale Bogus last notes on this. She states she has plenty of Flagyl however. She comes in today complaining of pain in the right lateral foot which she said lasted for about a day. The wound on the right foot is actually much more medially. She also tells me that the [facility] cost a lot of pain in the left foot wound and she turned back to silver alginate. Finally Dermagraft has a $962 per application co-pay. She cannot afford this 07/23/17; patient arrives today with the wound not much smaller. There is not much new to add. She has not heard from [name] all try to put in a call to them today. She was asking about Dermagraft again and she has an  over $400 per application co-pay she states that she would be willing to try to do a payment plan. I been tried to avoid this. We've been using silver alginate, I'll change to [facility] 07/30/17-She is here in follow-up evaluation for left foot ulcer. She continues hyperbaric medicine. The left foot ulcer is stable we will continue with same treatment plan 08/06/17; she is here for evaluation of her left foot ulcer. Currently being treated for hyperbarics or underlying osteomyelitis. She is completed antibiotics. The left foot ulcer is better smaller with healthier looking granulation. For various reasons I am not really clear on we never got her back to the OR with [name]. He did not respond to my secure text message. Nevertheless I think that surgery on this point is not necessary nor am I completely clear that a skin substitute is necessary The patient  is complaining about pain on the outside of her right foot. 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 There is no erythema. She also states the foot is warm versus her other part of her upper leg and this is largely true. It is not totally clear to me what's causing this. She thinks it's different from her usual neuropathy pain 08/13/17; she arrives in clinic today with a small wound which is superficial on her right first metatarsal head. 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 She tells [place] she was up on her feet over the Mother's Day celebration. ooThe large wound is on the left foot. Continues with hyperbarics for underlying osteomyelitis. We're using Hydrofera Blue. She asked me today about where we were with Dermagraft. I had actually excluded this because of the co-pay however she wants to assume this therefore I'll recheck the co-pay an order for next week. 08/20/17; the patient agreed to accept the co-pay of the first Apligraf which we applied today. She is disappointed she is finishing hyperbarics will run this through the insurance on the extent of the foot infection and the extent of the wound that she had however she is already had 60 dive's. Dermagraft No. 1 08/27/17; Dermagraft No. 2. She is not eligible for any more hyperbaric treatments this month. She reports a fair amount of drainage and she actually changed to the external dressings without disturbing the direct contact layer 09/03/17; the patient arrived in clinic today with the wound superficially looking quite healthy. Nice vibrant red tissue with some advancing epithelialization although not as much adherence of the flap as I might like. However she noted on her own fourth toe some bogginess and she brought that to our attention. Indeed this was boggy feeling like a possibility of subcutaneous fluid. She stated that this was similar to how an issue came up on the lateral foot that led to her fifth  ray amputation. She is not been unwell. We've been using Dermagraft 09/10/17; the culture that I did not last week was MRSA. She saw Dr. Megan Salon this morning who is going to start her on vancomycin. I had sent him a secure a text message yesterday. I also spoke with her podiatric surgeon [name] about surgery on this foot the options for conserving a functional foot etc. Promised me he would see her and will make back consultation today. Paradoxically her actual wound on the plantar aspect of her left foot looks really quite good. I had given her 5 days worth of Baxdella to cover her for MRSA. Her MRI came back showing osteomyelitis within the third  metatarsal shaft and head and base of the third and fourth proximal phalanx. She had extensive inflammatory changes throughout the soft tissue of the lateral forefoot. With an ill-defined fluid around the fourth metatarsal extending into the plantar and dorsal soft tissues 09/19/17; the patient is actually on oral Septra and Flagyl. She apparently refused IV vancomycin. She also saw [name] at my request who is planning her for a left BKA sometime in mid July. MRI showed osteomyelitis within the third metatarsal shaft and head and the basis of the third and fourth proximal phalanx. I believe there was felt to be possible septic arthritis involving the third MTP. 09/26/17; the patient went back to Dr. Megan Salon at my suggestion and is now receiving IV daptomycin. Her wound continues to look quite good making the decision to proceed with a transmetatarsal amputation although more difficult for the patient. I believe in my extensive discussions with her she has a good sense of the pros and cons of this. I don't NV the tuft decision she has to make. She has an appointment with [name] I believe in mid July and I previously spoken to him about this issue Has we had used 3 previous Dermagraft. Given the condition of the wound surface I went ahead and added  the fourth one today area and I did this not fully realizing that she'll be traveling to [place] next week. I'm hopeful she can come back in 2 weeks 10/21/17; Her same Dermagraft on for about 3-1/2 weeks. In spite of this the wound arrives looking quite healthy. There is been a lot of healing dimensions are smaller. Looking at the square shaped wound she has now there is some undermining and some depth medially under the undermining although I cannot palpate any bone. No surrounding infection is obvious. She has difficult questions about how to look at this going forward vis--vis amputations versus continued medical therapy. T be truthful the wound is looks so o healthy and it is continued to contract. Hard to justify foot surgery at this point although I still told her that I think it might come to that if we are not able to eradicate the underlying MRSA. She is still highly at risk and she understands this 11/06/17 on evaluation today patient appears to be doing better in regard to her foot ulcer. She's been tolerating the dressing changes without complication. Currently she is here for her Dermagraft #6. Her wound continues to make excellent progress at this point. She does not appear to have any evidence of infection which is good news. 11/13/17 on evaluation today patient appears to be doing excellent at this time. She is here for repeat Dermagraft application. This is #7. Overall her wound seems to be making great progress. 12/05/17; the patient arrives with the wound in much better condition than when I last saw this almost 6 weeks ago. She still has a small probing area in the left metatarsal head region on the lateral aspect of her foot. We applied her last Dermagraft today. ooSince the last time she is here she has what appears to a been a blood blister on the plantar aspect of left foot although I don't see this is threatening. There is also a thick raised tissue on the right mid metatarsal  head region. This was not there I don't think the last time she was here 3 weeks ago. 12/12/17; the patient continues to have a small programming area in the left metatarsal head region on the  lateral aspect of her foot which was the initial large surgical wound. I applied her last Apligraf last week. I'm going to use Endoform starting today ooUnfortunately she has an excoriated area in the left mid foot and the right mid foot. The left midfoot looks like a blistered area this was not opened last week it certainly is open today. Using silver alginate on these areas. She promises me she is offloading this. 12/19/17; the small probing area in the left metatarsal head eyes think is shallower. In general her original wound looks better. We've been using Endoform. The area inferiorly that I think was trauma last week still requires debridement a lot of nonviable surface which I removed. She still has an open open area distally in her foot ooSimilarly on the right foot there is tightly adherent surface debris which I removed. Still areas that don't look completely epithelialized. This is a small open area. We used silver alginate on these areas 12/26/2017; the patient did not have the supplies we ordered from last week including the Endoform. The original large wound on the left lateral foot looks healthy. She still has the undermining area that is largely unchanged from last week. She has the same heavily callused raised edged wounds on the right mid and left midfoot. Both of these requiring debridement. We have been using silver alginate on these areas 01/02/2018; there is still supply issues. We are going to try to use Prisma but I am not sure she actually got it from what she is saying. She has a new open area on the lateral aspect of the left fourth toe [previous fifth ray amputation]. Still the one tunneling area over the fourth metatarsal head. The area is in the midfoot bilaterally still have thick  callus around them. She is concerned about a raised swelling on the lateral aspect of the foot. However she is completely insensate 01/10/2018; we are using Prisma to the wounds on her bilateral feet. Surprisingly the tunneling area over the left fourth metatarsal head that was part of her original surgery has closed down. She has a small open area remaining on the incision line. 2 open areas in the midfoot. 02/10/2018; the patient arrives back in clinic after a month hiatus. She was traveling to visit family in [place]. Is fairly clear she was not offloading the areas on her feet. The original wound over the left lateral foot at the level of metatarsal heads is reopened and probes medially by about a centimeter or 2. She notes that a week ago she had purulent drainage come out of an area on the left midfoot. Paradoxically the worst area is actually on the right foot is extensive with purulent drainage. We will use silver alginate today 02/17/2018; the patient has 3 wounds one over the left lateral foot. She still has a small area over the metatarsal heads which is the remnant of her original surgical wound. This has medial probing depth of roughly 1.4 cm somewhat better than last week. The area on the right foot is larger. We have been using silver alginate to all areas. The area on the right foot and left foot that we cultured last week showed both Klebsiella and Proteus. Both of these are quinolone sensitive. The patient put her's self on Bactrim and Flagyl that she had left hanging around from prior antibiotic usages. She was apparently on this last week when she arrived. I did not realize this. Unfortunately the Bactrim will not cover either 1 of  these organisms. We will send in Cipro 500 twice daily for a week 03/04/2018; the patient has 2 wounds on the left foot one is the original wound which was a surgical wound for a deep DFU. At one point this had exposed bone. She still has an area over  the fourth metatarsal head that probes about 1.4 cm although I think this is better than last week. I been using silver nitrate to try and promote tissue adherence and been using silver alginate here. ooShe also has an area in the left midfoot. This has some depth but a small linear wound. Still requiring debridement. ooOn the right midfoot is a circular wound. A lot of thick callus around this area. ooWe have been using silver alginate to all wound areas ooShe is completed the ciprofloxacin I gave her 2 weeks ago. 03/11/2018; the patient continues to have 2 open areas on the left foot 1 of which was the original surgical wound for a deep DFU. Only a small probing area remains although this is not much different from last week we have been using silver alginate. The other area is on the midfoot this is smaller linear but still with some depth. We have been using silver alginate here as well ooOn the right foot she has a small circular wound in the mid aspect. This is not much smaller than last time. We have been using silver alginate here as well 03/18/2018; she has 3 wounds on the left foot the original surgical wound, a very superficial wound in the mid aspect and then finally the area in the mid plantar foot. She arrives in today with a very concerning area in the wound in the mid plantar foot which is her most proximal wound. There is undermining here of roughly 1-1/2 cm superiorly. Serosanguineous drainage. She tells me she had some pain on for over the weekend that shot up her foot into her thigh and she tells me that she had a nodule in the groin area. ooShe has the single wound in the right foot. ooWe are using endoform to both wound areas 03/24/2018; the patient arrives with the original surgical wound in the area on the left midfoot about the same as last week. There is a collection of fluid under the surface of the skin extending from the surgical wound towards the midfoot although  it does not reach the midfoot wound. The area on the right foot is about the same. Cultures from last week of the left midfoot wound showed abundant Klebsiella abundant Enterococcus faecalis and moderate methicillin resistant staph I gave her Levaquin but this would have only covered the Klebsiella. She will need linezolid 04/01/2018; she is taking linezolid but for the first few days only took 1 a day. I have advised her to finish this at twice daily dosing. In any case all of her wounds are a lot better especially on the left foot. The original surgical wound is closed. The area on the left midfoot considerably smaller. The area on the right foot also smaller. 04/08/2018; her original surgical wound/osteomyelitis on the left foot remains closed. She has area on the left foot that is in the midfoot area but she had some streaking towards this. This is not connected with her original wound at least not visually. ooSmall wound on the right midfoot appears somewhat smaller. 04/15/18; both wounds looks better. Original wound is better left midfoot. Using silver alginate 1/21; patient states she uses saltwater soak in, stones or remove  callus from around her wounds. She is also concerned about a blood blister she had on the left foot but it simply resolved on its own. We've been using silver alginate 1/28; the patient arrives today with the same streaking area from her metatarsals laterally [the site of her original surgical wound] down to the middle of her foot. There is some drainage in the subcutaneous area here. This concerns me that there is actually continued ongoing infection in the metatarsals probably the fourth and third. This fixates an MRI of the foot without contrast [chronic renal failure] ooThe wound in the mid part of the foot is small but I wonder whether this area actually connects with the more distal foot. ooThe area on the right midfoot is probably about the same. Callus thick skin  around the small wound which I removed with a curette we have been using silver alginate on both wound areas 2/4; culture I did of the draining site on the left foot last time grew methicillin sensitive staph aureus. MRI of the left foot showed interval resolution of the findings surrounding the third metatarsal joint on the prior study consistent with treated osteomyelitis. Chronic soft tissue ulceration in the plantar and lateral aspect of the forefoot without residual focal fluid collection. No evidence of recurrent osteomyelitis. 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 She tells me she had nausea and vomiting over the weekend and missed 2 days. In general her foot looks much the same. She has a small open area just below the left fourth metatarsal head. A linear area in the left midfoot. Some discoloration extending from the inferior part of this into the left lateral foot although this appears to be superficial. She has a small area on the right midfoot which generally looks smaller after debridement 2/18; the patient is completing his cephalexin and has another 2 days. She continues to have open areas on the left and right foot. 2/25; she is now off antibiotics. The area on the left foot at the site of her original surgical wound has closed yet again. She still has open areas in the mid part of her foot however these appear smaller. The area on the right mid foot looks about the same. We have been using silver alginate She tells me she had a serious hypoglycemic spell at home. She had to have EMS called and get IV dextrose 3/3; disappointing on the left lateral foot large area of necrotic tissue surrounding the linear area. This appears to track up  towards the same original surgical wound. Required extensive debridement. The area on the right plantar foot is not a lot better also using silver 3/12; the culture I did last time showed abundant enterococcus. I have prescribed Augmentin, should cover any unrecognized anaerobes as well. In addition there were a few MRSA and Serratia that would not be well covered although I did not want to give her multiple antibiotics. She comes in today with a new wound in the right midfoot this is not connected with the original wound over her MTP a lot of thick callus tissue around both wounds but once again she said she is not walking on these areas 3/17-Patient comes in for follow-up on the bilateral plantar wounds, the right  midfoot and the left plantar wound. Both these are heavily callused surrounding the wounds. We are continuing to use silver alginate, she is compliant with offloading and states she uses a wheelchair fairly often at home 3/24; both wound areas have thick callus. However things actually look quite a bit better here for the majority of her left foot and the right foot. 3/31; patient continues to have thick callused somewhat irritated looking tissue around the wounds which individually are fairly superficial. There is no evidence of surrounding infection. We have been using silver alginate however I change that to [facility] today 4/17; patient returns to clinic after having a scare with Covid she tested negative in her primary doctor's office. She has been using Hydrofera Blue. She does not have an open area on the right foot. On the left foot she has a small open area with the mid area not completely viable. She showed me pictures of what looks like a hemorrhagic blister from several days ago but that seems to have healed over this was on the lateral left foot 4/21; patient comes in to clinic with both her wounds on her feet closed. However over the weekend she started having pain in her  right foot and leg up into the thigh. She felt as though she was running a low-grade fever but did not take her temperature. She took a doxycycline that she had leftover and yesterday a single Septra and metronidazole. She thinks things feel somewhat better. 4/28; duplex ultrasound I ordered last week was negative for DVT or superficial thrombophlebitis. She is completed the doxycycline I gave her. States she is still having a lot of pain in the right calf and right ankle which is no better than last week. She cannot sleep. She also states she has a temperature of up to 101, coughing and complaining of visual loss in her bilateral eyes. Apparently she was tested for Covid 2 weeks ago at [facility] and that was negative. Readmission: 09/03/18 patient presents back for reevaluation after having been evaluated at the end of April regarding erythema and swelling of her right lower extremity. Subsequently she ended up going to the hospital on 07/29/18 and was admitted not to be discharged until 08/08/18. Unfortunately it was noted during the time that she was in the hospital that she did have methicillin-resistant Staphylococcus aureus as the infection noted at the site. It was also determined that she did have osteomyelitis which appears to be fairly significant. She was treated with vancomycin and in fact is still on IV vancomycin at dialysis currently. This is actually slated to continue until 09/12/18 at least which will be the completion of the six weeks of therapy. Nonetheless based on what I'm seeing at this point I'm not sure she will be anywhere near ready to discontinue antibiotics at that time. Since she was released from the hospital she was seen by [name] who is her podiatrist on 08/27/18. His note specifically states that he is recommended that the patient needs of one knee amputation on the right as she has a life- threatening situation that can lead quickly to sepsis. The patient advised she would  like to try to save her leg to which [name] apparently told her that this was against all medical advice. She also want to discontinue the Wound VAC which had been initiated due to the fact that she wasn't pleased with how the wound was looking and subsequently she wanted to pursue applying Medihoney at that  time. He stated that he did not believe that the right lower extremity was salvageable and that the patient understood but would still like to attempt hyperbaric option therapy if it could be of any benefit. She was therefore referred back to [place] for further evaluation. He plans to see her back next week. Upon inspection today patient has a significant amount purulent drainage noted from the wound at this point. The bone in the distal portion of her foot also appears to be extremely necrotic and spongy. When I push down on the bone it bubbles and seeps purulent drainage from deeper in the end of the foot. I do not think that this is likely going to heal very well at all and less aggressive surgical debridement were undertaken more than what I believe we can likely do here in our office. 09/12/2018; I have not seen this patient since the most recent hospitalization although she was in our clinic last week. I have reviewed some of her records from a complex hospitalization. 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 There is situation was complicated by MRSA bacteremia and acute on chronic renal failure now on dialysis. She is receiving vancomycin at dialysis. We started her on Dakin's wet-to-dry last week she is changing this daily. There is still purulent drainage coming out of her foot. Although she is apparently "agreeable" to a below-knee amputation which is been suggested by multiple clinicians she wants this to be done in [place]. She apparently has a telehealth visit with that provider sometime in late [place] 6/24. I have told her I think  this is probably too long. Nevertheless I could not convince her to allow a local doctor to perform BKA. 09/19/2018; the patient has a large necrotic area on the right anterior foot. She has had previous transmetatarsal amputations. Culture I did last week showed MRSA nothing else she is on vancomycin at dialysis. She has continued leaking purulent drainage out of the distal part of the large circular wound on the right anterior foot. She apparently went to see Dr. 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 Somehow that translated into her being referred to plastic surgery for debridement of the area. I gather she basically refused amputation although I do not have a copy of Dr. Berenice Primas notes. The patient really wants to have a trial of hyperbaric oxygen. I agreed with initial assessment in this clinic that this was probably too far along to benefit however if she is going to have plastic surgery I think she would benefit from ancillary hyperbaric oxygen. The issue here is that the patient has benefited as maximally as any patient I have ever seen from hyperbaric oxygen therapy. Most recently she had exposed bone on the lateral part of her left foot after a surgical procedure and that actually has closed. She has eschared areas in both heels but no open area. She is remained systemically well. I am not optimistic that anything can be done about this but the patient is very clear that she wants an attempt. The attempt would include a wound VAC further debridements and hyperbaric oxygen along with IV antibiotics. 6/26; I put her in for a trial of hyperbaric oxygen only because of the dramatic response she has had with wounds on her left midfoot earlier this year which was a surgical wound that went straight to her bone over the metatarsal heads and also remotely the left third toe. We  will see if we can get this through our review process and insurance. She arrives in clinic  with again purulent material pouring out of necrotic bone on the top of the foot distally. There is also some concerning erythema on the front of the leg that we marked. It is bit difficult to tell how tender this is because of neuropathy. I note from infectious disease that she had her vancomycin extended. 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 She had the wound VAC on for part of the week. The rest of the time she is putting various things on this including Medihoney, "ionized water" silver sorb gel etc. 7/7; follow-up along with HBO. She is still on vancomycin at dialysis. She has a large open area on the dorsal right foot and a small dark eschar area on her heel. There is a lot less erythema in the area and a lot less tenderness. From an infection point of view I think this is better. She still has a lot of necrosis in the remaining right forefoot [previous TMA] we are still using the wound VAC in this area 7/16; follow-up along with HBO. I put her on linezolid after she finished her vancomycin. We started this last Friday I gave her 2 weeks worth. I had the expectation that she would be operatively debrided by Dr. Marla Roe but that still has not happened yet. Patient phoned the office this week. She arrives for review today after HBO. The distal part of this wound is completely necrotic. Nonviable pieces of tendon bone was still purulent drainage. Also concerning that she has black eschar over the heel that is expanding. I think this may be indicative of infection in this area as well. She has less erythema and warmth in the ankle and calf but still an abnormal exam 7/21 follow-up along with HBO. I will renew her linezolid after checking a CBC with differential monitoring her blood counts especially her platelets. She was supposed to have surgery yesterday but if I am reading things correctly this was canceled after her blood sugar was found to be over 500. I  thought Dr. Marla Roe who called me said that they were sending her to the ER but the patient states that was not the case. 7/28. Follow-up along with HBO. She is on linezolid I still do not have any lab work from dialysis even though I called last week. The patient is concerned about an area on her left lateral foot about the level of the base of her fifth metatarsal. I did not really see anything that ominous here however this patient is in [place] ability to point out problems that she is sensing and she has been accurate in the past Finally she received a call from Dr. Marla Roe who is referring her to another orthopedic surgeon stating that she is too booked up to take her to the operating room now. Was still using a wound VAC on the foot 8/3 -Follow-up after HBO, she is got another week of linezolid, she is to call [facility] for an appointment, x-rays of both feet were reviewed, the left foot x-ray with third MTP joint osteo- Right foot x-ray widespread osteo-in the right midfoot Right ankle x-ray does not show any active evidence of infection 8/11-Patient is seen after HBO, the wounds on the right foot appear to be about the same, the heel wound had some necrotic base over tendon that was debrided with a curette 8/21; patient is seen  after HBO. The patient's wound on her dorsal foot actually looks reasonably good and there is substantial amount of epithelialization however the open area distally still has a lot of necrotic debris partially bone. I cannot really get a good sense of just how deep this probes under the foot. She has been pressuring me this week to order medical maggots through a company in [place] for her. The problem I have is there is not a defined wound area here. On the positive side there is no purulence. She has been to see infectious disease she is still on Septra DS although I have not had a chance to review their notes 8/28; patient is seen in conjunction with HBO. The  wounds on her foot continued to improve including the right dorsal foot substantially the, the distal part of this wound and the area on the right heel. We have been using a wound VAC over this chronically. She is still on trimethoprim as directed by infectious disease 9/4; patient is seen in conjunction with HBO. Right dorsal foot wound substantially anteriorly is better however she continues to have a deep wound in the distal part of this that is not responding. We have been using silver collagen under border foam ooArea on the right plantar medial heel seems better. We have been using Hydrofera Blue 12/12/18 on evaluation today patient appears to be doing about the same with regard to her wound based on prior measurements. She does have some necrotic tissue noted on the lateral aspect of the wound that is going require a little bit of sharp debridement today. This includes what appears to be potentially either severely necrotic bone or tendon. Nonetheless other than that she does not appear to have any severe infection which is good news 9/18; it is been 2 weeks since I saw this wound. She is tolerating HBO well. Continued dramatic improvement in the area on the right dorsal foot. She still has a small wound on the heel that we have been using Hydrofera Blue. She continues with a wound VAC 9/24; patient has to be seen emergently today with a swelling on her right lateral lower leg. She says that she told Dr. Evette Doffing about this and also myself on a couple of occasions but I really have no recollection of this. She is not systemically unwell and her wound really looked good the last time I saw this. She showed this to providers at dialysis and she was able to verify that she was started on cephalexin today for 5 doses at dialysis. She dialyzes on Tuesday Thursday and Saturday. 10/2; patient is seen in conjunction with HBO. The area that is draining on the right anterior medial tibia is more extensive.  Copious amounts of serosanguineous drainage with some purulence. We are still using the wound VAC on the original wound then it is stable. Culture I did of the original IandD showed MRSA I contacted dialysis she is now on vancomycin with dialysis treatments. I asked them to run a month 10/9; patient seen in conjunction with HBO. She had a new spontaneous open area just above the wound on the right medial tibia ankle. More swelling on the right medial tibia. Her wound on the foot looks about the same perhaps slightly better. There is no warmth spreading up her leg but no obvious erythema. her MRI of the foot and ankle and distal tib-fib is not booked for next Friday I discussed this with her in great detail over multiple days. it  is likely she has spreading infection upper leg at least involving the distal 25% above the ankle. She knows that if I refer her to orthopedics for infectious disease they are going to recommend amputation and indeed I am not against this myself. We had a good trial at trying to heal the foot which is what she wanted along with antibiotics debridement and HBO however she clearly has spreading infection [probably staph aureus/MRSA]. Nevertheless she once again tells me she wants to wait the left of the MRI. She still makes comments about having her amputation done in [place]. 10/19; arrives today with significant swelling on the lateral right leg. Last culture I did showed Klebsiella. Multidrug-resistant. Cipro was intermediate sensitivity and that is what I have her on pending her MRI which apparently is going to be done on Thursday this week although this seems to be moving back and forth. She is not systemically unwell. We are using silver alginate on her major wound area on the right medial foot and the draining areas on the right lateral lower leg 10/26; MRI showed extensive abscess in the anterior compartment of the right leg also widespread osteomyelitis involving  osseous structures of the midfoot and portions of the hindfoot. Also suspicion for osteomyelitis anterior aspect of the distal medial malleolus. Culture I did of the purulence once again showed a multidrug-resistant Klebsiella. I have been in contact with nephrology late last week and she has been started on cefepime at dialysis to replace the vancomycin We sent a copy of her MRI report to Dr. Geroge Baseman in [place] who is an orthopedic surgeon. The patient takes great stock in his opinion on this. She says she will go to [place] to have her leg amputated if Dr. Geroge Baseman does not feel there is any salvage options. 11/2; she still is not talk to her orthopedic surgeon in [place]. Apparently he will call her at [phone] this afternoon. The quality of this is she has not allowed me to refer her anywhere. She has been told over and over that she needs this amputated but has not agreed to be referred. She tells me her blood sugar was 600 last night but she has not been febrile. 11/9; she never did got a call from the orthopedic surgeon in [place] therefore that is off the radar. We have arranged to get her see orthopedic surgery at [facility]. She still has a lot of draining purulence coming out of the new abscess in her right leg although that probably came from the osteomyelitis in her right foot and heel. Meanwhile the original wound on the right foot looks very healthy. Continued improvement. The issue is that the last MRI showed osteomyelitis in her right foot extensively she now has an abscess in the right anterior lower leg. There is nobody in [place] who will offer this woman anything but an amputation and to be honest that is probably what she needs. I think she still wants to talk about limb salvage although at this point I just do not see that. She has completed her vancomycin at dialysis which was for the original staph aureus she is still on cefepime for the more recent Klebsiella. She has  had a long course of both of these antibiotics which should have benefited the osteomyelitis on the right foot as well as the abscess. 11/16; apparently [place] elective surgery is shut down because of COVID-19 pandemic. I have reached out to some contacts at [facility] to see if we can get her  an orthopedic appointment there. I am concerned about continually leaving this but for the moment everything is static. In fact her original large wound on this foot is closing down. It is the abscess on the right anterior leg that continues to drain purulent serosanguineous material. She is not currently on any antibiotics however she had a prolonged course of vancomycin [1 month] as well as cefepime for a month 02/24/2019 on evaluation today patient appears to be doing better than the last time I saw her. This is not a patient that I typically see. With that being said I am covering for Dr. Dellia Nims this week and again compared to when I last saw her overall the wounds in particular seem to be doing significantly better which is good news. With that being said the patient tells me several disconcerting things. She has not been able to get in to see anyone for potential debridement in regard to her leg wounds although she tells me that she does not think it is necessary any longer because she is taking care of that herself. She noticed a string coming out of the lower wound on her leg over the last week. The patient states that she subsequently decided that we must of pack something in there and started pulling the string out and as it kept coming and coming she realized this was likely her tendon. With that being said she continued to remove as much of this as she could. She then I subsequently proceeded to using tubes of antibiotic ointment which she will stick down into the wound and then scored as much as she can until she sees it coming out of the other wound opening. She states that in doing this she is  actually made things better and there is less redness and irritation. With regard to her foot wound she does have some necrotic tendon and tissue noted in one small corner but again the actual wound itself seems to be doing better with good granulation in general compared to my last evaluation. 12/7; continued improvement in the wound on the substantial part of the right medial foot. Still a necrotic area inferiorly that required debridement but the rest of this looks very healthy and is contracting. She has 2 wounds on the right lateral leg which were her original drainage sites from her abscess but all of this looks a lot better as well. She has been using silver alginate after putting antibiotic biotic ointment in one wound and watching it come out the other. I have talked to her in some detail today. I had given her names of orthopedic surgeons at [facility] for second opinion on what to do about the right leg. I do not think the patient never called them. She has not been able to get a hold of the orthopedic surgeon in [place] that she had put a lot of faith in as being somebody would give her an opinion that she would trust. I talked to her today and said even if I could get her in to another orthopedic surgeon about the leg which she accept an amputation and she said she would not therefore I am not going to press this issue for the moment 12/14; continued improvement in his substantial wound on the right medial foot. There is still a necrotic area inferiorly with tightly adherent necrotic debris which I have been working on debriding each time she is here. She does not have an orthopedic appointment. Since last time she was  here I looked over her cultures which were essentially MRSA on the foot wound and gram-negative rods in the abscess on the anterior leg. 12/21; continued improvement in the area on the right medial foot. She is not up on this much and that is probably a good thing since I  do not know it could support continuous ambulation. She has a small area on the right lateral leg which were remanence of the IandD's I did because of the abscess. I think she should probably have prophylactic antibiotics I am going to have to look this over to see if we can make an intelligent decision here. In the meantime her major wound is come down nicely. Necrotic area inferiorly is still there but looks a lot better 04/06/2019; she has had some improvement in the overall surface area on the right medial foot somewhat narrowedr both but somewhat longer. The areas on the right lateral leg which were initial IandD sites are superficial. Nothing is present on the right heel. We are using silver alginate to the wound areas 1/18; right medial foot somewhat smaller. Still a deep probing area in the most distal recess of the wound. She has nothing open on the right leg. She has a new wound on the plantar aspect of her left fourth toe which may have come from just pulling skin. The patient using Medihoney on the wound on her foot under silver alginate. I cannot discourage her from this 2/1; 2-week follow-up using silver alginate on the right foot and her left fourth toe. The area on the right dorsal foot is contracted although there is still the deep area in the most distal part of the wound but still has some probing depth. No overt infection 2/15; 2-week follow-up. She continues to have improvement in the surface area on the dorsal right foot. Even the tunneling area from last time is almost closed. The area that was on the plantar part of her left fourth toe over the PIP is indeed closed 3/1; 2-week follow-up. Continued improvement in surface area. The original divot that we have been debriding inferiorly I think has full epithelialization although the epithelialization is gone down into the wound with probably 4 mm of depth. Even under intense illumination I am unable to see anything open here.  The remanence of the wound in this area actually look quite healthy. We have been using silver alginate 3/15; 2-week follow-up. Unfortunately not as good today. She has a comma shaped wound on the dorsal foot however the upper part of this is larger. Under illumination debris on the surface She also tells [place] that she was on her right leg 2 times in the last couple of weeks mostly to reach up for things above her head etc. She felt a sharp pain in the right leg which she thinks is somewhere from the ankle to the knee. The patient has neuropathy and is really uncertain. She cannot feel her foot so she does not think it was coming from there 3/29; 2-week follow-up. Her wound measures smaller. Surface of the wound appears reasonable. She is using silver alginate with underlying Medihoney. She has home health. X-rays I did of her tib-fib last time were negative although it did show arterial calcification 4/12; 2-week follow-up. Her wound measures smaller in length. Using manuka honey with silver alginate on top. She has home health. 4/26; 2-week follow-up. Her wound is smaller but still very adherent debris under illumination requiring debridement she has been using manuka honey  with silver alginate. 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 Been using Hydrofera Blue. She is on suppressive Bactrim but apparently she has been taking it twice daily 6/7; I have not seen this wound and about 6 weeks. Since then she was up in [place]. By her own admission she was walking on the foot because she did not have a wheelchair. The wound is not nearly as healthy looking as it was the last time I saw this. We ordered different things for her but she only uses Medihoney and silver alginate. As far as I know she is on suppressive trimethoprim sulfamethoxazole. She does not admit to any fever or chills. Her  CBGs apparently are at baseline however she is saying that she feels some discomfort on the lateral part of her ankle I looked over her last inflammatory markers from the summer 2020 at which time she had a deeply necrotic infected wound in this area. On 11/10/2018 her sedimentation rate was 56 and C-reactive protein 9.9. This was 107 and 29 on 07/29/2018. 6/17; the patient had a necrotic wound the last time she was here on the right dorsal foot. After debridement I did a culture. This showed a very resistant ESBL Klebsiella as well as Enterococcus. Her x-ray of the foot which was done because of warmth and some discomfort showed bone destruction within the carpal bones involving the navicular acute cuboid lateral middle cuneiforms but essentially unchanged from her prior study which was done on 10/29/2018. The findings were felt to represent chronic osteomyelitis. We did inflammatory markers on her. Her white count was 5.25 sedimentation rate 16 and C-reactive protein at 11.1. Notable for the fact that in August 2020 her CRP was 9.9 and sedimentation rate 56. I have looked at her x-rays. It is true that the bone destruction is very impressive however the patient came into this clinic for the wound on her right foot with pieces of bone literally falling out anteriorly with purulent material. I am not exactly sure I could have expected anything different. She has not been systemically unwell no fever chills or blood sugars have been reasonable. 6/28; she arrives with a right heel closed. The substantial area on the right anterior foot looks healthy. Much better looking surface. I think we can change to [facility] seems to help this previously. She is getting her antibiotics at dialysis she should be just about finished 7/9; changed to [facility] last week. Surface wound looks satisfactory not much change in surface area however. She is going to [place] next week this is usually a difficult  thing for this patient follow-up will be for 2 weeks. Objective Constitutional Patient is hypertensive.. Pulse regular and within target range for patient.Marland Kitchen Respirations regular, non-labored and within target range.. Temperature is normal and within the target range for the patient.Marland Kitchen Appears in no distress. Vitals Time Taken: 1:09 PM, Height: 67 in, Weight: 125 lbs, BMI: 19.6, Temperature: 99.2 F, Pulse: 97 bpm, Respiratory Rate: 18 breaths/min, Blood Pressure: 186/98 mmHg. Respiratory work of breathing is normal. Cardiovascular Peripheral pulses are palpable. General Notes: Wound exam ooRight forefoot wound looks clean granulated but not much change in surface area. Small area of epithelialization I think is similar to last week. Integumentary (Hair, Skin) Wound #43 status is Open. Original cause of wound was Gradually Appeared. The wound is located on the Right,Medial Foot. The  wound measures 4cm length x 3.8cm width x 0.1cm depth; 11.938cm^2 area and 1.194cm^3 volume. There is muscle and Fat Layer (Subcutaneous Tissue) Exposed exposed. There is no tunneling or undermining noted. There is a medium amount of serosanguineous drainage noted. The wound margin is flat and intact. There is small (1-33%) pink, pale granulation within the wound bed. There is a large (67-100%) amount of necrotic tissue within the wound bed including Adherent Slough. 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 Dressing Change Frequency: Wound #43 Right,Medial Foot: Change Dressing every other day. Wound Cleansing: Clean wound with Wound Cleanser - or normal saline Primary Wound Dressing: Wound #43 Right,Medial  Foot: Hydrofera Blue - Classic Secondary Dressing: Wound #43 Right,Medial Foot: Kerlix/Rolled Gauze Dry Gauze Edema Control: Elevate legs to the level of the heart or above for 30 minutes daily and/or when sitting, a frequency of: - throughout the day Home Health: [facility] for wound care. - Interim #1 I am continue with Hydrofera Blue and I have asked her to continue this. Home health still has not provided this 2. I see no evidence of infection 3. Her peripheral pulses are palpable no active ischemia. 4. She is traveling to [place] tomorrow and will be away next week this always concerns me in this patient however we will have to see how she does when she comes back in 2 weeks Electronic Signature(s) Signed: 10/13/2019 8:12:39 AM By: Linton Ham MD Entered By: Linton Ham on 10/09/2019 14:00:45 -------------------------------------------------------------------------------- SuperBill Details Patient Name: Date of Service: [name] 10/09/2019 Medical Record Number: [id number] Patient Account Number: [id number] Date of Birth/Sex: Treating RN: Feb 08, 1971 (49 y.o. [name] Primary Care Provider: [name], [name] Other Clinician: Referring Provider: Treating Provider/Extender: [name], [name] Weeks in Treatment: 57 Diagnosis Coding ICD-10 Codes Code Description [phone] Other chronic osteomyelitis, right ankle and foot L97.514 Non-pressure chronic ulcer of other part of right foot with necrosis of bone L97.411 Non-pressure chronic ulcer of right heel and midfoot limited to breakdown of skin E10.621 Type 1 diabetes mellitus with foot ulcer Facility Procedures CPT4 Code: [id number] Description: 99213 - WOUND CARE VISIT-LEV 3 EST PT Modifier: Quantity: 1 Physician Procedures : CPT4 Code Description Modifier [id number] - WC PHYS LEVEL 3 - EST PT ICD-10 Diagnosis Description L97.514 Non-pressure  chronic ulcer of other part of right foot with necrosis of bone L97.411 Non-pressure chronic ulcer of right heel and midfoot  limited to breakdown of skin M86.671 Other chronic osteomyelitis, right ankle and foot Quantity: 1 Electronic Signature(s) Signed: 10/13/2019 8:12:39 AM By: Linton Ham MD Entered By: Linton Ham on 10/09/2019 14:01:40

## 2019-10-13 NOTE — Progress Notes (Addendum)
Nancy, [name] ([id number]) Visit Report for 10/09/2019 Arrival Information Details Patient Name: Date of Service: [name] 10/09/2019 1:15 PM Medical Record Number: [id number] Patient Account Number: [id number] Date of Birth/Sex: Treating RN: Sep 12, 1970 (49 y.o. [name] Primary Care Cricket Goodlin: [name], [name] Other Clinician: Referring Tyrus Wilms: Treating Franklin Baumbach/Extender: [name], [name] Weeks in Treatment: 44 Visit Information History Since Last Visit All ordered tests and consults were completed: No Patient Arrived: Wheel Chair Added or deleted any medications: No Arrival Time: 13:07 Any new allergies or adverse reactions: No Accompanied By: self Had a fall or experienced change in No Transfer Assistance: None activities of daily living that may affect Patient Identification Verified: Yes risk of falls: Secondary Verification Process Completed: Yes Signs or symptoms of abuse/neglect since last visito No Patient Requires Transmission-Based Precautions: No Hospitalized since last visit: No Patient Has Alerts: No Implantable device outside of the clinic excluding No cellular tissue based products placed in the center since last visit: Has Dressing in Place as Prescribed: Yes Pain Present Now: No Electronic Signature(s) Signed: 10/12/2019 5:13:16 PM By: Carlene Coria RN Entered By: Carlene Coria on 10/09/2019 13:09:47 -------------------------------------------------------------------------------- Clinic Level of Care Assessment Details Patient Name: Date of Service: [name] [name] 10/09/2019 1:15 PM Medical Record Number: [id number] Patient Account Number: [id number] Date of Birth/Sex: Treating RN: 05-Jul-1970 (49 y.o. F) Dwiggins, Shannon Primary Care Korin Setzler: [name], [name] Other Clinician: Referring Chao Blazejewski: Treating Allesha Aronoff/Extender: [name], [name] Weeks in Treatment: 55 Clinic Level of Care  Assessment Items TOOL 4 Quantity Score X- 1 0 Use when only an EandM is performed on FOLLOW-UP visit ASSESSMENTS - Nursing Assessment / Reassessment X- 1 10 Reassessment of Co-morbidities (includes updates in patient status) X- 1 5 Reassessment of Adherence to Treatment Plan ASSESSMENTS - Wound and Skin A ssessment / Reassessment X - Simple Wound Assessment / Reassessment - one wound 1 5 []  - 0 Complex Wound Assessment / Reassessment - multiple wounds []  - 0 Dermatologic / Skin Assessment (not related to wound area) ASSESSMENTS - Focused Assessment X- 1 5 Circumferential Edema Measurements - multi extremities []  - 0 Nutritional Assessment / Counseling / Intervention []  - 0 Lower Extremity Assessment (monofilament, tuning fork, pulses) []  - 0 Peripheral Arterial Disease Assessment (using hand held doppler) ASSESSMENTS - Ostomy and/or Continence Assessment and Care []  - 0 Incontinence Assessment and Management []  - 0 Ostomy Care Assessment and Management (repouching, etc.) PROCESS - Coordination of Care X - Simple Patient / Family Education for ongoing care 1 15 []  - 0 Complex (extensive) Patient / Family Education for ongoing care X- 1 10 Staff obtains Programmer, systems, Records, T Results / Process Orders est X- 1 10 Staff telephones HHA, Nursing Homes / Clarify orders / etc []  - 0 Routine Transfer to another Facility (non-emergent condition) []  - 0 Routine Hospital Admission (non-emergent condition) []  - 0 New Admissions / Biomedical engineer / Ordering NPWT Apligraf, etc. , []  - 0 Emergency Hospital Admission (emergent condition) X- 1 10 Simple Discharge Coordination []  - 0 Complex (extensive) Discharge Coordination PROCESS - Special Needs []  - 0 Pediatric / Minor Patient Management []  - 0 Isolation Patient Management []  - 0 Hearing / Language / Visual special needs []  - 0 Assessment of Community assistance (transportation, D/C planning, etc.) []  -  0 Additional assistance / Altered mentation []  - 0 Support Surface(s) Assessment (bed, cushion, seat, etc.) INTERVENTIONS - Wound  Cleansing / Measurement X - Simple Wound Cleansing - one wound 1 5 []  - 0 Complex Wound Cleansing - multiple wounds X- 1 5 Wound Imaging (photographs - any number of wounds) []  - 0 Wound Tracing (instead of photographs) X- 1 5 Simple Wound Measurement - one wound []  - 0 Complex Wound Measurement - multiple wounds INTERVENTIONS - Wound Dressings X - Small Wound Dressing one or multiple wounds 1 10 []  - 0 Medium Wound Dressing one or multiple wounds []  - 0 Large Wound Dressing one or multiple wounds X- 1 5 Application of Medications - topical []  - 0 Application of Medications - injection INTERVENTIONS - Miscellaneous []  - 0 External ear exam []  - 0 Specimen Collection (cultures, biopsies, blood, body fluids, etc.) []  - 0 Specimen(s) / Culture(s) sent or taken to Lab for analysis []  - 0 Patient Transfer (multiple staff / Civil Service fast streamer / Similar devices) []  - 0 Simple Staple / Suture removal (25 or less) []  - 0 Complex Staple / Suture removal (26 or more) []  - 0 Hypo / Hyperglycemic Management (close monitor of Blood Glucose) []  - 0 Ankle / Brachial Index (ABI) - do not check if billed separately X- 1 5 Vital Signs Has the patient been seen at the hospital within the last three years: Yes Total Score: 105 Level Of Care: New/Established - Level 3 Electronic Signature(s) Signed: 10/09/2019 5:57:27 PM By: [name] Entered By: [name] on 10/09/2019 13:51:12 -------------------------------------------------------------------------------- Encounter Discharge Information Details Patient Name: Date of Service: [name] [address] [name] 10/09/2019 1:15 PM Medical Record Number: [id number] Patient Account Number: [id number] Date of Birth/Sex: Treating RN: Apr 23, 1970 (49 y.o. Nancy Fetter Primary Care Kamauri Kathol: [name], [name]  Other Clinician: Referring Dedrick Heffner: Treating Travell Desaulniers/Extender: [name], [name] Weeks in Treatment: 41 Encounter Discharge Information Items Discharge Condition: Stable Ambulatory Status: Wheelchair Discharge Destination: Home Transportation: Private Auto Accompanied By: alone Schedule Follow-up Appointment: Yes Clinical Summary of Care: Patient Declined Electronic Signature(s) Signed: 10/12/2019 5:04:17 PM By: [name] RN, BSN Entered By: [name] on 10/09/2019 16:53:04 -------------------------------------------------------------------------------- Lower Extremity Assessment Details Patient Name: Date of Service: [name] [name] 10/09/2019 1:15 PM Medical Record Number: [id number] Patient Account Number: [id number] Date of Birth/Sex: Treating RN: [birth year] (49 y.o. [name] Primary Care Jhalil Silvera: [name], [name] Other Clinician: Referring Purva Vessell: Treating Salbador Fiveash/Extender: [name], [name] Weeks in Treatment: 57 Edema Assessment Assessed: [Left: No] [Right: No] Edema: [Left: Ye] [Right: s] Calf Left: Right: Point of Measurement: cm From Medial Instep cm 27 cm Ankle Left: Right: Point of Measurement: cm From Medial Instep cm 18 cm Electronic Signature(s) Signed: 10/12/2019 5:13:16 PM By: Carlene Coria RN Entered By: Carlene Coria on 10/09/2019 13:14:08 -------------------------------------------------------------------------------- Multi Wound Chart Details Patient Name: Date of Service: [name] [name] 10/09/2019 1:15 PM Medical Record Number: [id number] Patient Account Number: [id number] Date of Birth/Sex: Treating RN: 1970/08/08 (49 y.o. [name] Primary Care Ashlei Chinchilla: [name], [name] Other Clinician: Referring Elvin Mccartin: Treating Chadley Dziedzic/Extender: [name], [name] Weeks in Treatment: 23 Vital Signs Height(in): 26 Pulse(bpm): 98 Weight(lbs): 125 Blood  Pressure(mmHg): 186/98 Body Mass Index(BMI): 20 Temperature(F): 99.2 Respiratory Rate(breaths/min): 18 Photos: [43:No Photos Right, Medial Foot] [N/A:N/A N/A] Wound Location: [43:Gradually Appeared] [N/A:N/A] Wounding Event: [43:Diabetic Wound/Ulcer of the Lower] [N/A:N/A] Primary Etiology: [43:Extremity Cataracts, Chronic sinus] [N/A:N/A] Comorbid History: [43:problems/congestion, Anemia, Sleep Apnea, Deep Vein Thrombosis, Hypertension, Peripheral Arterial  Disease, Type I Diabetes, Osteoarthritis, Osteomyelitis, Neuropathy, Seizure Disorder 08/04/2018] [N/A:N/A] Date Acquired: [43:57] [N/A:N/A] Weeks of Treatment: [43:Open] [N/A:N/A] Wound Status: [43:Yes] [N/A:N/A] Clustered Wound: [43:4x3.8x0.1] [N/A:N/A] Measurements L x W x D (cm) [43:11.938] [N/A:N/A] A (cm) : rea [43:1.194] [N/A:N/A] Volume (cm) : [43:83.50%] [N/A:N/A] % Reduction in A rea: [43:94.50%] [N/A:N/A] % Reduction in Volume: [43:Grade 4] [N/A:N/A] Classification: [43:Medium] [N/A:N/A] Exudate A mount: [43:Serosanguineous] [N/A:N/A] Exudate Type: [43:red, brown] [N/A:N/A] Exudate Color: [43:Flat and Intact] [N/A:N/A] Wound Margin: [43:Small (1-33%)] [N/A:N/A] Granulation A mount: [43:Pink, Pale] [N/A:N/A] Granulation Quality: [43:Large (67-100%)] [N/A:N/A] Necrotic A mount: [43:Fat Layer (Subcutaneous Tissue)] [N/A:N/A] Exposed Structures: [43:Exposed: Yes Muscle: Yes Fascia: No Tendon: No Joint: No Bone: No None] [N/A:N/A] Treatment Notes Electronic Signature(s) Signed: 10/09/2019 5:57:27 PM By: [name] Signed: 10/13/2019 8:12:39 AM By: Linton Ham MD Entered By: Linton Ham on 10/09/2019 13:54:59 -------------------------------------------------------------------------------- Multi-Disciplinary Care Plan Details Patient Name: Date of Service: [address] [name] 10/09/2019 1:15 PM Medical Record Number: [id number] Patient Account Number: [id number] Date of Birth/Sex: Treating  RN: 02-06-1971 (49 y.o. [name] Primary Care Channel Papandrea: [name], [name] Other Clinician: Referring Zoriana Oats: Treating Sadeel Fiddler/Extender: [name], [name] Weeks in Treatment: 62 Active Inactive Wound/Skin Impairment Nursing Diagnoses: Impaired tissue integrity Knowledge deficit related to ulceration/compromised skin integrity Goals: Patient/caregiver will verbalize understanding of skin care regimen Date Initiated: 09/03/2018 Target Resolution Date: 10/29/2019 Goal Status: Active Ulcer/skin breakdown will have a volume reduction of 30% by week 4 Date Initiated: 09/03/2018 Date Inactivated: 10/07/2018 Target Resolution Date: 10/01/2018 Goal Status: Unmet Unmet Reason: COMORBITIES Ulcer/skin breakdown will have a volume reduction of 50% by week 8 Date Initiated: 10/07/2018 Date Inactivated: 11/03/2018 Target Resolution Date: 10/31/2018 Goal Status: Unmet Unmet Reason: Osteomyelitis Interventions: Assess patient/caregiver ability to obtain necessary supplies Assess patient/caregiver ability to perform ulcer/skin care regimen upon admission and as needed Assess ulceration(s) every visit Provide education on ulcer and skin care Treatment Activities: Skin care regimen initiated : 09/03/2018 Topical wound management initiated : 09/03/2018 Notes: Electronic Signature(s) Signed: 10/09/2019 5:57:27 PM By: [name] Entered By: [name] on 10/09/2019 13:44:39 -------------------------------------------------------------------------------- Pain Assessment Details Patient Name: Date of Service: Lorelee New [name] 10/09/2019 1:15 PM Medical Record Number: [id number] Patient Account Number: [id number] Date of Birth/Sex: Treating RN: Nov 21, 1970 (49 y.o. Voncille Lo, [place] Primary Care Cortney Mckinney: [name], [name] Other Clinician: Referring Suhayla Chisom: Treating Evrett Hakim/Extender: [name], [name] Weeks in Treatment: 28 Active  Problems Location of Pain Severity and Description of Pain Patient Has Paino No Site Locations Pain Management and Medication Current Pain Management: Medication: No Cold Application: No Rest: No Massage: No Activity: No T.E.N.S.: No Heat Application: No Leg drop or elevation: No Is the Current Pain Management Adequate: Inadequate How does your wound impact your activities of daily livingo Sleep: No Bathing: No Appetite: No Relationship With Others: No Bladder Continence: No Emotions: No Bowel Continence: No Work: No Toileting: No Drive: No Dressing: No Hobbies: No Electronic Signature(s) Signed: 10/12/2019 5:13:16 PM By: Carlene Coria RN Entered By: Carlene Coria on 10/09/2019 13:10:54 -------------------------------------------------------------------------------- Patient/Caregiver Education Details Patient Name: Date of Service: [name] [place], [place]. 7/9/2021andnbsp1:15 PM Medical Record Number: [id number] Patient Account Number: [id number] Date of Birth/Gender: Treating RN: [birth year] (49 y.o. [name] Primary Care Physician: [name], [name] Other Clinician: Referring Physician: Treating Physician/Extender: [name], [name] Weeks in Treatment: 25 Education Assessment Education Provided To: Patient Education Topics  Provided Wound/Skin Impairment: Handouts: Caring for Your Ulcer Methods: Explain/Verbal Responses: State content correctly Electronic Signature(s) Signed: 10/09/2019 5:57:27 PM By: [name] Entered By: [name] on 10/09/2019 13:45:00 -------------------------------------------------------------------------------- Wound Assessment Details Patient Name: Date of Service: [name] [name] 10/09/2019 1:15 PM Medical Record Number: [id number] Patient Account Number: [id number] Date of Birth/Sex: Treating RN: 1970-11-24 (49 y.o. [name] Primary Care Zhane Donlan: [name], [name] Other  Clinician: Referring Alanny Rivers: Treating Jerrid Forgette/Extender: [name], [name] Weeks in Treatment: 57 Wound Status Wound Number: 43 Primary Diabetic Wound/Ulcer of the Lower Extremity Etiology: Wound Location: Right, Medial Foot Wound Open Wounding Event: Gradually Appeared Status: Date Acquired: 08/04/2018 Comorbid Cataracts, Chronic sinus problems/congestion, Anemia, Sleep Weeks Of Treatment: 57 History: Apnea, Deep Vein Thrombosis, Hypertension, Peripheral Arterial Clustered Wound: Yes Disease, Type I Diabetes, Osteoarthritis, Osteomyelitis, Neuropathy, Seizure Disorder Photos Photo Uploaded By: [name] on 10/12/2019 15:46:45 Wound Measurements Length: (cm) 4 % Redu Width: (cm) 3.8 % Redu Depth: (cm) 0.1 Epithe Area: (cm) 11.938 Tunne Volume: (cm) 1.194 Under ction in Area: 83.5% ction in Volume: 94.5% lialization: None ling: No mining: No Wound Description Classification: Grade 4 Foul O Wound Margin: Flat and Intact Slough Exudate Amount: Medium Exudate Type: Serosanguineous Exudate Color: red, brown dor After Cleansing: No /Fibrino Yes Wound Bed Granulation Amount: Small (1-33%) Exposed Structure Granulation Quality: Pink, Pale Fascia Exposed: No Necrotic Amount: Large (67-100%) Fat Layer (Subcutaneous Tissue) Exposed: Yes Necrotic Quality: Adherent Slough Tendon Exposed: No Muscle Exposed: Yes Necrosis of Muscle: No Joint Exposed: No Bone Exposed: No Treatment Notes Wound #43 (Right, Medial Foot) 1. Cleanse With Wound Cleanser 3. Primary Dressing Applied Hydrofera Blue 4. Secondary Dressing Dry Gauze Roll Gauze 5. Secured With Recruitment consultant) Signed: 10/12/2019 5:13:16 PM By: Carlene Coria RN Entered By: Carlene Coria on 10/09/2019 13:14:52 -------------------------------------------------------------------------------- Vitals Details Patient Name: Date of Service: [name] [name] 10/09/2019 1:15  PM Medical Record Number: [id number] Patient Account Number: [id number] Date of Birth/Sex: Treating RN: [birth year] (49 y.o. [name] Primary Care Parthenia Tellefsen: [name], [name] Other Clinician: Referring Luvinia Lucy: Treating Issiac Jamar/Extender: [name], [name] Weeks in Treatment: 1 Vital Signs Time Taken: 13:09 Temperature (F): 99.2 Height (in): 67 Pulse (bpm): 97 Weight (lbs): 125 Respiratory Rate (breaths/min): 18 Body Mass Index (BMI): 19.6 Blood Pressure (mmHg): 186/98 Reference Range: 80 - 120 mg / dl Electronic Signature(s) Signed: 10/12/2019 5:13:16 PM By: Carlene Coria RN Entered By: Carlene Coria on 10/09/2019 13:10:12

## 2019-10-22 LAB — COMPREHENSIVE METABOLIC PANEL: Calcium: 7.8 — AB (ref 8.7–10.7)

## 2019-10-22 LAB — HEMOGLOBIN A1C: Hemoglobin A1C: 8.4

## 2019-10-23 ENCOUNTER — Encounter (HOSPITAL_BASED_OUTPATIENT_CLINIC_OR_DEPARTMENT_OTHER): Payer: Medicare HMO | Admitting: Internal Medicine

## 2019-10-23 DIAGNOSIS — E10621 Type 1 diabetes mellitus with foot ulcer: Secondary | ICD-10-CM | POA: Diagnosis not present

## 2019-10-23 NOTE — Progress Notes (Signed)
IDALIZ, TINKLE ([id number]) Visit Report for 10/23/2019 HPI Details Patient Name: Date of Service: [name] 10/23/2019 1:15 PM Medical Record Number: [id number] Patient Account Number: [id number] Date of Birth/Sex: Treating RN: [id number] (49 y.o. [name] Primary Care Provider: [name], [name] Other Clinician: Referring Provider: Treating Provider/Extender: [name], [name] Weeks in Treatment: 19 History of Present Illness HPI Description: 49 year old diabetic who is known to have type 1 diabetes which is poorly controlled last hemoglobin A1c was 11%. She comes in with a ulcerated area on the left lateral foot which has been there for over 6 months. Was recently she has been treated by [name] of podiatry who saw her last on 05/28/2016. Review of his notes revealed that the patient had incision and drainage with placement of antibiotic beads to the left foot on 04/11/2016 for possible osteomyelitis of the cuboid bone. Over the last year she's had a history of amputation of the left fifth toe and a femoropopliteal popliteal bypass graft somewhere in April 2017. 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 His note [name] mentions that the patient has been referred to me for further wound care and possibly great candidate for hyperbaric oxygen therapy due to recurrent osteomyelitis. However we do not have any x-rays of biopsy reports confirming this. He has been on several antibiotics including Bactrim and most recently is on doxycycline for an MRSA. I understand, the patient was not a candidate for IV antibiotics as she has had previous PICC lines which resulted in blood clots in both arms. There was a x-ray report dated 04/04/2016 on [name] notes which showed evidence of fifth ray resection left foot with osteolytic changes noted to the fourth metatarsal and cuboid bone on the left. 06/13/2016 -- had a left foot x-ray which  showed no acute fracture or dislocation and no definite radiographic evidence of osteomyelitis. Advanced osteopenia was seen. 06/20/2016 -- she has noticed a new wound on the right plantar foot in the region where she had a callus before. 06/27/16- the patient did have her x-ray of the right foot which showed no findings to suggest osteomyelitis. She saw her endocrinologist, [name], yesterday. Her A1c in January was 11. He also indicates mismanagement and noncompliance regarding her diabetes. She is currently on Bactrim for a lip infection. She is complaining of nausea, vomiting and diarrhea. She is unable to articulate the exact orders or dosing of the Bactrim; it is unclear when she will complete this. 07/04/2016 -- results from [facility] of ABIs with ankle waveforms were noted from 02/14/2016. The examination done on 06/27/2015 showed noncompressible ABIs with the right being 1.45 and the left being 1.33. The present examination showed a right ABI of 1.19 on the left of 1.33. The conclusion was that right normal ABI in the lower extremity at rest however compared to previous study which was noncompressible ABI may be falsely elevated side suggesting medial calcification. The left ABI suggested medial calcification. 08/01/2016 -- the patient had more redness and pain on her right foot and did not get to come to see as noted she see her PCP or go to the ER and decided to take some leftover metronidazole which she had at home. As usual, the patient does report she feels and is rather noncompliant. 08/08/2016 -- -- x-ray of the right foot -- FINDINGS:Transmetatarsal amputation is noted. No bony destruction is noted to suggest osteomyelitis. IMPRESSION: No evidence of  osteomyelitis. Postsurgical changes are seen. MRI would be more sensitive for possible bony changes. Culture has grown Serratia Marcescens -- sensitive to Bactrim, ciprofloxacin, ceftazidime she was seen by [name] on  08/06/2016. He did not find any exposed bone, muscle, tendon, ligament or joint. There was no malodor and he did a excisional debridement in the office. ============ Old notes: 49 year old patient who is known to the wound clinic for a while had been away from the wound clinic since 09/01/2014. Over the last several months she has been admitted to various hospitals including [facility] [place]. She was treated for a right metatarsal osteomyelitis with a transmetatarsal amputation and this was done about 2 months ago. He has a small ulcerated area on the right heel and she continues to have an ulcerated area on the left plantar aspect of the foot. The patient was recently admitted to the [facility] between 7/12 and 10/18/2014. she was given 3 weeks of IV vancomycin and was to follow-up with her surgeons at [facility] and also took oral vancomycin for C. difficile colitis. 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 MRI of the left foot done on 10/14/2014 showed no abscess or osteomyelitis. 04/27/15; this is a patient we know from previous stays in the wound care center. She is a type I diabetic I am not sure of her control currently. Since the last time I saw her she is had a right transmetatarsal amputation and has no wounds on her right foot and has no open wounds. She is been followed at the wound care center at [facility] in [place]. She comes today with the desire to undergo hyperbaric treatment locally. Apparently one of her wound care providers in [place] has suggested hyperbarics. This is in response to an MRI from 04/18/15 that showed increased marrow signal and loss of the proximal  fifth metatarsal cortex evidence of osteomyelitis with likely early osteomyelitis in the cuboid bone as well. She has a large wound over the base of the fifth metatarsal. She also has a eschar over her the tips of her toes on 1,3 and 5. She does not have peripheral pulses and apparently is going for an angiogram tomorrow which seems reasonable. After this she is going to infectious disease at [facility]. They have been using Medihoney to the large wound on the lateral aspect of the left foot to. The patient has known Charcot deformity from diabetic neuropathy. She also has known diabetic PAD. Surprisingly I can't see that she has had any recent antibiotics, the patient states the last antibiotic she had was at the end of November for 10 days. I think this was in response to culture that showed group G strep although I'm not exactly sure where the culture was from. She is also had arterial studies on 03/29/15. This showed a right ABI of 1.4 that was noncompressible. Her left ABI was 0.73. 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 It was not felt that arterial inflow was adequate for healing of a foot ulcer. Her Doppler waveforms looked monophasic ===== READMISSION 02/28/17; this is in an now 49 year old woman we've had at several different occasions in this clinic. 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 She has  a remote ex-smoker. She was last seen in this clinic by Dr. Con Memos I think in May. More recently she is been followed by her podiatrist [name] an infectious disease Dr. Megan Salon. She has 2 open wounds the major one is over the right first metatarsal head she also has a wound on the left plantar foot. an MRI of the right foot on 01/01/17 showed a soft tissue ulcer along the plantar aspect of the first metatarsal base consistent with osteomyelitis of the first metatarsal stump. Dr. 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 According to the patient this is been open for slightly over a month. She has been on a combination of Cipro 500 twice a day, Zyvox 600 twice a day and Flagyl 500 3 times a day for over a month now as directed by Dr. Megan Salon. cultures of the right foot earlier this year showed MRSA in January and Serratia in May. January also had a few viridans strep. Recent x-rays of both feet were done and [name] office and I don't have these reports. The patient has known PAD and has a history of aleft femoropopliteal bypass in April 2017. 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 Dwyane Dee. In spite of this the last hemoglobin A1c I can see is 10.1 on 01/01/2017. She is being referred by 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 She is been using Medihoney to this area and also an area on the left midfoot. She is using healing sandals bilaterally. ABIs in this clinic at the left posterior tibial was 1.1 noncompressible on the right READMISSION Non invasive vascular NOVANT 5/18 Aftercare following surgery of the circulatory system Procedure Note - Interface, External Ris In - 08/13/2016 11:05 AM EDT Procedure: Examination consists of physiologic resting arterial pressures of the brachial and ankle arteries bilaterally with continuous wave Doppler waveform analysis. Previous: Previous exam performed on 02/14/16 demonstrated ABIs of Rt = 1.19 and Lt = 1.33. Right: ABI = non-compressible PT 1.47 DP. S/P transmet amputation. , Left: ABI = 1.52, 2nd digit pressure = 87 mmHg Conclusions: Right: ABI (>1.3) may be falsely elevated, suggesting medial calcification. Left: ABI (>1.3) may be falsely  elevated, suggesting medial calcification The patient is a now 49 year old type I diabetic is had multiple issues her graded to chronic diabetic foot ulcers. 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 We had her in the clinic lastin November. At that point she had wounds on her bilateral feet.she had wanted to try hyperbarics however the healogics review process denied her because she hadn't followed up with her vascular surgeon for her left femoropopliteal bypass. The bypass was done by Dr. Raul Del at [facility]. We made her a follow-up with Dr. Raul Del however she did not keep the appointment and therefore she was not approved The patient shows me a small wound on her left fourth metatarsal head on her phone. She developed rapid discoloration in the plantar aspect of the left foot and she was admitted to hospital from 2/2 through 05/10/17 with wet gangrene of the left foot osteomyelitis of the fourth metatarsal heads. She was admitted acutely ill with a temperature of 103.  She was started on broad-spectrum vancomycin and cefepime. On 05/06/17 she was taken to the OR by [name] her podiatric surgeon for an incision and drainage irrigation of the left foot wound. Cultures from this surgery revealed group be strep and anaerobes. she was seen by Dr.Xu of orthopedic surgery and scheduled for a below-knee amputation which she u refused. Ultimately she was discharged on Levaquin and Flagyl for one month. MRI 05/05/17 done while she was in the hospital showed abscess adjacent to the fourth metatarsal head and neck small abscess around the fourth flexor tendon. Inflammatory phlegmon and gas in the soft tissues along the lateral aspect of the fourth phalanx. Findings worrisome for osteomyelitis involving the fourth proximal and middle phalanx and also the third and fourth metatarsals. Finally the patient had actually shortly  before this followed up with Dr. Raul Del at no time on 04/29/17. He felt that her left femoropopliteal bypass was patent he felt that her left-sided toe pressures more than adequate for healing a wound on the left foot. This was before her acute presentation. 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 05/28/17; she is started hyperbarics. The patient tells me that for some reason she was not actually on Levaquin but I think on ciprofloxacin. She was on Flagyl. She only started her Levaquin yesterday due to some difficulty with the pharmacy and perhaps her sister picking it up. She has an appointment with [name] tomorrow and with infectious disease early next week. She has no new complaints 06/06/17; the patient continues in hyperbarics. She saw [name] on 05/29/17 who is her podiatric surgeon. He is elected for a transmetatarsal amputation on 06/27/17. I'm not sure at what level he plans to do this amputation. The patient is unaware She also saw Dr. Megan Salon of infectious disease who elected to continue her on current antibiotics I think this is ciprofloxacin and Flagyl. I'll need to clarify with her tomorrow if she actually has this. We're using silver alginate to the actual wound. Necrotic surface today with material under the flap of her foot. Original MRI showed abscesses as well as osteomyelitis of the proximal and middle fourth phalanx and the third and fourth metatarsal heads 06/11/17; patient continues in hyperbarics and continues on oral antibiotics. She is doing well. The wound looks better. The necrotic part of this under the flap in her superior foot also looks better. she is been to see [name]. I haven't had a chance to look at his note. Apparently he has put the transmetatarsal amputation on hold her request it is still planning to take her to the OR for debridement and product application ACEL. I'll see if I can find his note. I'll therefore leave product ordering/requests to [name]  for now. I was going to look at Dermagraft 06/18/17-she is here in follow-up evaluation for bilateral foot wounds. She continues with hyperbaric therapy. She states she has been applying manuka honey to the right plantar foot and alternate manuka honey and silver alginate to the left foot, despite our orders. We will continue with same treatment plan and she will follo up next week. 06/25/17; I have reviewed [name] last note from 3/11. She has operative debridement in 2 days' time. By review his note apparently they're going to place there is skin over the majority of this wound which is a good choice. She has a small satellite area at the most proximal part of this wound on the left plantar foot. The area on the right  plantar foot we've been using silver alginate and it is close to healing. 07/02/17; unfortunately the patient was not easily approved for [name] proposed surgery. I'm not completely certain what the issue is. She has been using silver alginate to the wound she has completed a first course of hyperbarics. She is still on Levaquin and Flagyl. I have really lost track of the time course here.I suspect she should have another week to 2 of antibiotics. I'll need to see if she is followed up with infectious disease Dr. Megan Salon 07/09/17; the patient is followed up with Dr. Megan Salon. 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 She continues on Levaquin and metronidazole continuing both of these for now I think she is been on fr about 6 weeks. She still has some drainage but no pain. No fever. Her had been plans for her to go to the OR for operative debridement with her podiatrist [name], I am not exactly sure where that is. I'll probably slip a note to [name] today. I note that she follows with Dr. Dwyane Dee of endocrinology. We have her recertified for hyperbaric oxygen. I have not heard about Dermagraft however I'll see if [name] is planning a  skin substitute as well 07/16/17; the patient tells me she is just about out of [place]. I'll need to check Dr. Hale Bogus last notes on this. She states she has plenty of Flagyl however. She comes in today complaining of pain in the right lateral foot which she said lasted for about a day. The wound on the right foot is actually much more medially. She also tells me that the [facility] cost a lot of pain in the left foot wound and she turned back to silver alginate. Finally Dermagraft has a $481 per application co-pay. She cannot afford this 07/23/17; patient arrives today with the wound not much smaller. There is not much new to add. She has not heard from [name] all try to put in a call to them today. She was asking about Dermagraft again and she has an over $856 per application co-pay she states that she would be willing to try to do a payment plan. I been tried to avoid this. We've been using silver alginate, I'll change to [facility] 07/30/17-She is here in follow-up evaluation for left foot ulcer. She continues hyperbaric medicine. The left foot ulcer is stable we will continue with same treatment plan 08/06/17; she is here for evaluation of her left foot ulcer. Currently being treated for hyperbarics or underlying osteomyelitis. She is completed antibiotics. The left foot ulcer is better smaller with healthier looking granulation. For various reasons I am not really clear on we never got her back to the OR with [name]. He did not respond to my secure text message. Nevertheless I think that surgery on this point is not necessary nor am I completely clear that a skin substitute is necessary The patient is complaining about pain on the outside of her right foot. 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 There is no erythema. She also states the foot is warm versus her other part of her upper leg and this is largely true. It is not totally clear to me what's causing this. She  thinks it's different from her usual neuropathy pain 08/13/17; she arrives in clinic today with a small wound which is superficial on her right first metatarsal head. 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 She tells [place] she was up on her feet over the Mother's Day celebration. The large wound is on the left foot. Continues with hyperbarics for underlying osteomyelitis. We're using Hydrofera Blue. She asked me today about where we were with Dermagraft. I had actually excluded this because of the co-pay however she wants to assume this therefore I'll recheck the co-pay an order for next week. 08/20/17; the patient agreed to accept the co-pay of the first Apligraf which we applied today. She is disappointed she is finishing hyperbarics will run this through the insurance on the extent of the foot infection and the extent of the wound that she had however she is already had 60 dive's. Dermagraft No. 1 08/27/17; Dermagraft No. 2. She is not eligible for any more hyperbaric treatments this month. She reports a fair amount of drainage and she actually changed to the external dressings without disturbing the direct contact layer 09/03/17; the patient arrived in clinic today with the wound superficially looking quite healthy. Nice vibrant red tissue with some advancing epithelialization although not as much adherence of the flap as I might like. However she noted on her own fourth toe some bogginess and she brought that to our attention. Indeed this was boggy feeling like a possibility of subcutaneous fluid. 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 She is not been unwell. We've been using Dermagraft 09/10/17; the culture that I did not last week was MRSA. She saw Dr. Megan Salon this morning who is going to start her on vancomycin. I had sent him a secure a text message yesterday. I also spoke with her podiatric surgeon  [name] about surgery on this foot the options for conserving a functional foot etc. Promised me he would see her and will make back consultation today. Paradoxically her actual wound on the plantar aspect of her left foot looks really quite good. I had given her 5 days worth of Baxdella to cover her for MRSA. Her MRI came back showing osteomyelitis within the third metatarsal shaft and head and base of the third and fourth proximal phalanx. She had extensive inflammatory changes throughout the soft tissue of the lateral forefoot. With an ill-defined fluid around the fourth metatarsal extending into the plantar and dorsal soft tissues 09/19/17; the patient is actually on oral Septra and Flagyl. She apparently refused IV vancomycin. She also saw [name] at my request who is planning her for a left BKA sometime in mid July. MRI showed osteomyelitis within the third metatarsal shaft and head and the basis of the third and fourth proximal phalanx. I believe there was felt to be possible septic arthritis involving the third MTP. 09/26/17; the patient went back to Dr. Megan Salon at my suggestion and is now receiving IV daptomycin. Her wound continues to look quite good making the decision to proceed with a transmetatarsal amputation although more difficult for the patient. I believe in my extensive discussions with her she has a good sense of the pros and cons of this. I don't NV the tuft decision she has to make. She has an appointment with [name] I believe in mid July and I previously spoken to him about this issue Has we had used 3 previous Dermagraft. Given the condition of the wound surface I went ahead and added the fourth one today area and I did this not fully realizing that she'll be traveling  to [place] next week. I'm hopeful she can come back in 2 weeks 10/21/17; Her same Dermagraft on for about 3-1/2 weeks. In spite of this the wound arrives looking quite healthy. There is been a lot of  healing dimensions are smaller. Looking at the square shaped wound she has now there is some undermining and some depth medially under the undermining although I cannot palpate any bone. No surrounding infection is obvious. She has difficult questions about how to look at this going forward vis--vis amputations versus continued medical therapy. T be truthful the wound is looks o so healthy and it is continued to contract. Hard to justify foot surgery at this point although I still told her that I think it might come to that if we are not able to eradicate the underlying MRSA. She is still highly at risk and she understands this 11/06/17 on evaluation today patient appears to be doing better in regard to her foot ulcer. She's been tolerating the dressing changes without complication. Currently she is here for her Dermagraft #6. Her wound continues to make excellent progress at this point. She does not appear to have any evidence of infection which is good news. 11/13/17 on evaluation today patient appears to be doing excellent at this time. She is here for repeat Dermagraft application. This is #7. Overall her wound seems to be making great progress. 12/05/17; the patient arrives with the wound in much better condition than when I last saw this almost 6 weeks ago. She still has a small probing area in the left metatarsal head region on the lateral aspect of her foot. We applied her last Dermagraft today. Since the last time she is here she has what appears to a been a blood blister on the plantar aspect of left foot although I don't see this is threatening. There is also a thick raised tissue on the right mid metatarsal head region. This was not there I don't think the last time she was here 3 weeks ago. 12/12/17; the patient continues to have a small programming area in the left metatarsal head region on the lateral aspect of her foot which was the initial large surgical wound. I applied her last Apligraf  last week. I'm going to use Endoform starting today Unfortunately she has an excoriated area in the left mid foot and the right mid foot. The left midfoot looks like a blistered area this was not opened last week it certainly is open today. Using silver alginate on these areas. She promises me she is offloading this. 12/19/17; the small probing area in the left metatarsal head eyes think is shallower. In general her original wound looks better. We've been using Endoform. The area inferiorly that I think was trauma last week still requires debridement a lot of nonviable surface which I removed. She still has an open open area distally in her foot Similarly on the right foot there is tightly adherent surface debris which I removed. Still areas that don't look completely epithelialized. This is a small open area. We used silver alginate on these areas 12/26/2017; the patient did not have the supplies we ordered from last week including the Endoform. The original large wound on the left lateral foot looks healthy. She still has the undermining area that is largely unchanged from last week. She has the same heavily callused raised edged wounds on the right mid and left midfoot. Both of these requiring debridement. We have been using silver alginate on these areas 01/02/2018;  there is still supply issues. We are going to try to use Prisma but I am not sure she actually got it from what she is saying. She has a new open area on the lateral aspect of the left fourth toe [previous fifth ray amputation]. Still the one tunneling area over the fourth metatarsal head. The area is in the midfoot bilaterally still have thick callus around them. She is concerned about a raised swelling on the lateral aspect of the foot. However she is completely insensate 01/10/2018; we are using Prisma to the wounds on her bilateral feet. Surprisingly the tunneling area over the left fourth metatarsal head that was part of  her original surgery has closed down. She has a small open area remaining on the incision line. 2 open areas in the midfoot. 02/10/2018; the patient arrives back in clinic after a month hiatus. She was traveling to visit family in [place]. Is fairly clear she was not offloading the areas on her feet. The original wound over the left lateral foot at the level of metatarsal heads is reopened and probes medially by about a centimeter or 2. She notes that a week ago she had purulent drainage come out of an area on the left midfoot. Paradoxically the worst area is actually on the right foot is extensive with purulent drainage. We will use silver alginate today 02/17/2018; the patient has 3 wounds one over the left lateral foot. She still has a small area over the metatarsal heads which is the remnant of her original surgical wound. This has medial probing depth of roughly 1.4 cm somewhat better than last week. The area on the right foot is larger. We have been using silver alginate to all areas. The area on the right foot and left foot that we cultured last week showed both Klebsiella and Proteus. Both of these are quinolone sensitive. The patient put her's self on Bactrim and Flagyl that she had left hanging around from prior antibiotic usages. She was apparently on this last week when she arrived. I did not realize this. Unfortunately the Bactrim will not cover either 1 of these organisms. We will send in Cipro 500 twice daily for a week 03/04/2018; the patient has 2 wounds on the left foot one is the original wound which was a surgical wound for a deep DFU. At one point this had exposed bone. She still has an area over the fourth metatarsal head that probes about 1.4 cm although I think this is better than last week. I been using silver nitrate to try and promote tissue adherence and been using silver alginate here. She also has an area in the left midfoot. This has some depth but a small linear wound.  Still requiring debridement. On the right midfoot is a circular wound. A lot of thick callus around this area. We have been using silver alginate to all wound areas She is completed the ciprofloxacin I gave her 2 weeks ago. 03/11/2018; the patient continues to have 2 open areas on the left foot 1 of which was the original surgical wound for a deep DFU. Only a small probing area remains although this is not much different from last week we have been using silver alginate. The other area is on the midfoot this is smaller linear but still with some depth. We have been using silver alginate here as well On the right foot she has a small circular wound in the mid aspect. This is not much smaller than last  time. We have been using silver alginate here as well 03/18/2018; she has 3 wounds on the left foot the original surgical wound, a very superficial wound in the mid aspect and then finally the area in the mid plantar foot. She arrives in today with a very concerning area in the wound in the mid plantar foot which is her most proximal wound. There is undermining here of roughly 1-1/2 cm superiorly. Serosanguineous drainage. She tells me she had some pain on for over the weekend that shot up her foot into her thigh and she tells me that she had a nodule in the groin area. She has the single wound in the right foot. We are using endoform to both wound areas 03/24/2018; the patient arrives with the original surgical wound in the area on the left midfoot about the same as last week. There is a collection of fluid under the surface of the skin extending from the surgical wound towards the midfoot although it does not reach the midfoot wound. The area on the right foot is about the same. Cultures from last week of the left midfoot wound showed abundant Klebsiella abundant Enterococcus faecalis and moderate methicillin resistant staph I gave her Levaquin but this would have only covered the Klebsiella. She  will need linezolid 04/01/2018; she is taking linezolid but for the first few days only took 1 a day. I have advised her to finish this at twice daily dosing. In any case all of her wounds are a lot better especially on the left foot. The original surgical wound is closed. The area on the left midfoot considerably smaller. The area on the right foot also smaller. 04/08/2018; her original surgical wound/osteomyelitis on the left foot remains closed. She has area on the left foot that is in the midfoot area but she had some streaking towards this. This is not connected with her original wound at least not visually. Small wound on the right midfoot appears somewhat smaller. 04/15/18; both wounds looks better. Original wound is better left midfoot. Using silver alginate 1/21; patient states she uses saltwater soak in, stones or remove callus from around her wounds. She is also concerned about a blood blister she had on the left foot but it simply resolved on its own. We've been using silver alginate 1/28; the patient arrives today with the same streaking area from her metatarsals laterally [the site of her original surgical wound] down to the middle of her foot. There is some drainage in the subcutaneous area here. This concerns me that there is actually continued ongoing infection in the metatarsals probably the fourth and third. This fixates an MRI of the foot without contrast [chronic renal failure] The wound in the mid part of the foot is small but I wonder whether this area actually connects with the more distal foot. The area on the right midfoot is probably about the same. Callus thick skin around the small wound which I removed with a curette we have been using silver alginate on both wound areas 2/4; culture I did of the draining site on the left foot last time grew methicillin sensitive staph aureus. MRI of the left foot showed interval resolution of the findings surrounding the third metatarsal  joint on the prior study consistent with treated osteomyelitis. Chronic soft tissue ulceration in the plantar and lateral aspect of the forefoot without residual focal fluid collection. No evidence of recurrent osteomyelitis. Noted to have the previous amputation of the distal first phalanx and fifth  ray MRI of the right foot showed no evidence of osteomyelitis I am going to treat the patient with a prolonged course of antibiotics directed against MSSA in the left foot 2/11; patient continues on cephalexin. She tells me she had nausea and vomiting over the weekend and missed 2 days. In general her foot looks much the same. She has a small open area just below the left fourth metatarsal head. A linear area in the left midfoot. Some discoloration extending from the inferior part of this into the left lateral foot although this appears to be superficial. She has a small area on the right midfoot which generally looks smaller after debridement 2/18; the patient is completing his cephalexin and has another 2 days. She continues to have open areas on the left and right foot. 2/25; she is now off antibiotics. The area on the left foot at the site of her original surgical wound has closed yet again. She still has open areas in the mid part of her foot however these appear smaller. The area on the right mid foot looks about the same. We have been using silver alginate She tells me she had a serious hypoglycemic spell at home. She had to have EMS called and get IV dextrose 3/3; disappointing on the left lateral foot large area of necrotic tissue surrounding the linear area. This appears to track up towards the same original surgical wound. Required extensive debridement. The area on the right plantar foot is not a lot better also using silver 3/12; the culture I did last time showed abundant enterococcus. I have prescribed Augmentin, should cover any unrecognized anaerobes as well. In addition there were a few  MRSA and Serratia that would not be well covered although I did not want to give her multiple antibiotics. She comes in today with a new wound in the right midfoot this is not connected with the original wound over her MTP a lot of thick callus tissue around both wounds but once again she said she is not walking on these areas 3/17-Patient comes in for follow-up on the bilateral plantar wounds, the right midfoot and the left plantar wound. Both these are heavily callused surrounding the wounds. We are continuing to use silver alginate, she is compliant with offloading and states she uses a wheelchair fairly often at home 3/24; both wound areas have thick callus. However things actually look quite a bit better here for the majority of her left foot and the right foot. 3/31; patient continues to have thick callused somewhat irritated looking tissue around the wounds which individually are fairly superficial. There is no evidence of surrounding infection. We have been using silver alginate however I change that to [facility] today 4/17; patient returns to clinic after having a scare with Covid she tested negative in her primary doctor's office. She has been using Hydrofera Blue. She does not have an open area on the right foot. On the left foot she has a small open area with the mid area not completely viable. She showed me pictures of what looks like a hemorrhagic blister from several days ago but that seems to have healed over this was on the lateral left foot 4/21; patient comes in to clinic with both her wounds on her feet closed. However over the weekend she started having pain in her right foot and leg up into the thigh. She felt as though she was running a low-grade fever but did not take her temperature. She took a  doxycycline that she had leftover and yesterday a single Septra and metronidazole. She thinks things feel somewhat better. 4/28; duplex ultrasound I ordered last week was negative  for DVT or superficial thrombophlebitis. She is completed the doxycycline I gave her. States she is still having a lot of pain in the right calf and right ankle which is no better than last week. She cannot sleep. She also states she has a temperature of up to 101, coughing and complaining of visual loss in her bilateral eyes. Apparently she was tested for Covid 2 weeks ago at [facility] and that was negative. Readmission: 09/03/18 patient presents back for reevaluation after having been evaluated at the end of April regarding erythema and swelling of her right lower extremity. Subsequently she ended up going to the hospital on 07/29/18 and was admitted not to be discharged until 08/08/18. Unfortunately it was noted during the time that she was in the hospital that she did have methicillin-resistant Staphylococcus aureus as the infection noted at the site. It was also determined that she did have osteomyelitis which appears to be fairly significant. She was treated with vancomycin and in fact is still on IV vancomycin at dialysis currently. This is actually slated to continue until 09/12/18 at least which will be the completion of the six weeks of therapy. Nonetheless based on what I'm seeing at this point I'm not sure she will be anywhere near ready to discontinue antibiotics at that time. Since she was released from the hospital she was seen by [name] who is her podiatrist on 08/27/18. His note specifically states that he is recommended that the patient needs of one knee amputation on the right as she has a life- threatening situation that can lead quickly to sepsis. The patient advised she would like to try to save her leg to which [name] apparently told her that this was against all medical advice. She also want to discontinue the Wound VAC which had been initiated due to the fact that she wasn't pleased with how the wound was looking and subsequently she wanted to pursue applying Medihoney at that time.  He stated that he did not believe that the right lower extremity was salvageable and that the patient understood but would still like to attempt hyperbaric option therapy if it could be of any benefit. She was therefore referred back to [place] for further evaluation. He plans to see her back next week. Upon inspection today patient has a significant amount purulent drainage noted from the wound at this point. The bone in the distal portion of her foot also appears to be extremely necrotic and spongy. When I push down on the bone it bubbles and seeps purulent drainage from deeper in the end of the foot. I do not think that this is likely going to heal very well at all and less aggressive surgical debridement were undertaken more than what I believe we can likely do here in our office. 09/12/2018; I have not seen this patient since the most recent hospitalization although she was in our clinic last week. I have reviewed some of her records from a complex hospitalization. 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 There is situation was complicated by MRSA bacteremia and acute on chronic renal failure now on dialysis. She is receiving vancomycin at dialysis. We started her on Dakin's wet-to-dry last week she is changing this daily. There is still purulent drainage coming out of her foot.  Although she is apparently "agreeable" to a below-knee amputation which is been suggested by multiple clinicians she wants this to be done in [place]. She apparently has a telehealth visit with that provider sometime in late Tye 6/24. I have told her I think this is probably too long. Nevertheless I could not convince her to allow a local doctor to perform BKA. 09/19/2018; the patient has a large necrotic area on the right anterior foot. She has had previous transmetatarsal amputations. Culture I did last week showed MRSA nothing else she is on vancomycin at dialysis. She  has continued leaking purulent drainage out of the distal part of the large circular wound on the right anterior foot. She apparently went to see Dr. 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 Somehow that translated into her being referred to plastic surgery for debridement of the area. I gather she basically refused amputation although I do not have a copy of Dr. Berenice Primas notes. The patient really wants to have a trial of hyperbaric oxygen. I agreed with initial assessment in this clinic that this was probably too far along to benefit however if she is going to have plastic surgery I think she would benefit from ancillary hyperbaric oxygen. The issue here is that the patient has benefited as maximally as any patient I have ever seen from hyperbaric oxygen therapy. Most recently she had exposed bone on the lateral part of her left foot after a surgical procedure and that actually has closed. She has eschared areas in both heels but no open area. She is remained systemically well. I am not optimistic that anything can be done about this but the patient is very clear that she wants an attempt. The attempt would include a wound VAC further debridements and hyperbaric oxygen along with IV antibiotics. 6/26; I put her in for a trial of hyperbaric oxygen only because of the dramatic response she has had with wounds on her left midfoot earlier this year which was a surgical wound that went straight to her bone over the metatarsal heads and also remotely the left third toe. We will see if we can get this through our review process and insurance. She arrives in clinic with again purulent material pouring out of necrotic bone on the top of the foot distally. There is also some concerning erythema on the front of the leg that we marked. It is bit difficult to tell how tender this is because of neuropathy. I note from infectious disease that she had her vancomycin extended. All the  cultures of these areas have shown MRSA sensitive to vancomycin. She had the wound VAC on for part of the week. The rest of the time she is putting various things on this including Medihoney, "ionized water" silver sorb gel etc. 7/7; follow-up along with HBO. She is still on vancomycin at dialysis. She has a large open area on the dorsal right foot and a small dark eschar area on her heel. There is a lot less erythema in the area and a lot less tenderness. From an infection point of view I think this is better. She still has a lot of necrosis in the remaining right forefoot [previous TMA] we are still using the wound VAC in this area 7/16; follow-up along with HBO. I put her on linezolid after she finished her vancomycin. We started this last Friday I gave her 2 weeks worth. I had the expectation that she would be operatively debrided by Dr. Marla Roe  but that still has not happened yet. Patient phoned the office this week. She arrives for review today after HBO. The distal part of this wound is completely necrotic. Nonviable pieces of tendon bone was still purulent drainage. Also concerning that she has black eschar over the heel that is expanding. I think this may be indicative of infection in this area as well. She has less erythema and warmth in the ankle and calf but still an abnormal exam 7/21 follow-up along with HBO. I will renew her linezolid after checking a CBC with differential monitoring her blood counts especially her platelets. She was supposed to have surgery yesterday but if I am reading things correctly this was canceled after her blood sugar was found to be over 500. I thought Dr. Marla Roe who called me said that they were sending her to the ER but the patient states that was not the case. 7/28. Follow-up along with HBO. She is on linezolid I still do not have any lab work from dialysis even though I called last week. The patient is concerned about an area on her left lateral  foot about the level of the base of her fifth metatarsal. I did not really see anything that ominous here however this patient is in [place] ability to point out problems that she is sensing and she has been accurate in the past Finally she received a call from Dr. Marla Roe who is referring her to another orthopedic surgeon stating that she is too booked up to take her to the operating room now. Was still using a wound VAC on the foot 8/3 -Follow-up after HBO, she is got another week of linezolid, she is to call ID for an appointment, x-rays of both feet were reviewed, the left foot x-ray with third MTP joint osteo- Right foot x-ray widespread osteo-in the right midfoot Right ankle x-ray does not show any active evidence of infection 8/11-Patient is seen after HBO, the wounds on the right foot appear to be about the same, the heel wound had some necrotic base over tendon that was debrided with a curette 8/21; patient is seen after HBO. The patient's wound on her dorsal foot actually looks reasonably good and there is substantial amount of epithelialization however the open area distally still has a lot of necrotic debris partially bone. I cannot really get a good sense of just how deep this probes under the foot. She has been pressuring me this week to order medical maggots through a company in [place] for her. The problem I have is there is not a defined wound area here. On the positive side there is no purulence. She has been to see infectious disease she is still on Septra DS although I have not had a chance to review their notes 8/28; patient is seen in conjunction with HBO. The wounds on her foot continued to improve including the right dorsal foot substantially the, the distal part of this wound and the area on the right heel. We have been using a wound VAC over this chronically. She is still on trimethoprim as directed by infectious disease 9/4; patient is seen in conjunction with HBO.  Right dorsal foot wound substantially anteriorly is better however she continues to have a deep wound in the distal part of this that is not responding. We have been using silver collagen under border foam Area on the right plantar medial heel seems better. We have been using Hydrofera Blue 12/12/18 on evaluation today patient appears to be  doing about the same with regard to her wound based on prior measurements. She does have some necrotic tissue noted on the lateral aspect of the wound that is going require a little bit of sharp debridement today. This includes what appears to be potentially either severely necrotic bone or tendon. Nonetheless other than that she does not appear to have any severe infection which is good news 9/18; it is been 2 weeks since I saw this wound. She is tolerating HBO well. Continued dramatic improvement in the area on the right dorsal foot. She still has a small wound on the heel that we have been using Hydrofera Blue. She continues with a wound VAC 9/24; patient has to be seen emergently today with a swelling on her right lateral lower leg. She says that she told Dr. Evette Doffing about this and also myself on a couple of occasions but I really have no recollection of this. She is not systemically unwell and her wound really looked good the last time I saw this. She showed this to providers at dialysis and she was able to verify that she was started on cephalexin today for 5 doses at dialysis. She dialyzes on Tuesday Thursday and Saturday. 10/2; patient is seen in conjunction with HBO. The area that is draining on the right anterior medial tibia is more extensive. Copious amounts of serosanguineous drainage with some purulence. We are still using the wound VAC on the original wound then it is stable. Culture I did of the original IandD showed MRSA I contacted dialysis she is now on vancomycin with dialysis treatments. I asked them to run a month 10/9; patient seen in  conjunction with HBO. She had a new spontaneous open area just above the wound on the right medial tibia ankle. More swelling on the right medial tibia. Her wound on the foot looks about the same perhaps slightly better. There is no warmth spreading up her leg but no obvious erythema. her MRI of the foot and ankle and distal tib-fib is not booked for next Friday I discussed this with her in great detail over multiple days. it is likely she has spreading infection upper leg at least involving the distal 25% above the ankle. She knows that if I refer her to orthopedics for infectious disease they are going to recommend amputation and indeed I am not against this myself. We had a good trial at trying to heal the foot which is what she wanted along with antibiotics debridement and HBO however she clearly has spreading infection [probably staph aureus/MRSA]. Nevertheless she once again tells me she wants to wait the left of the MRI. She still makes comments about having her amputation done in [place]. 10/19; arrives today with significant swelling on the lateral right leg. Last culture I did showed Klebsiella. Multidrug-resistant. Cipro was intermediate sensitivity and that is what I have her on pending her MRI which apparently is going to be done on Thursday this week although this seems to be moving back and forth. She is not systemically unwell. We are using silver alginate on her major wound area on the right medial foot and the draining areas on the right lateral lower leg 10/26; MRI showed extensive abscess in the anterior compartment of the right leg also widespread osteomyelitis involving osseous structures of the midfoot and portions of the hindfoot. Also suspicion for osteomyelitis anterior aspect of the distal medial malleolus. Culture I did of the purulence once again showed a multidrug-resistant Klebsiella. I have been  in contact with nephrology late last week and she has been started on  cefepime at dialysis to replace the vancomycin We sent a copy of her MRI report to Dr. Geroge Baseman in [place] who is an orthopedic surgeon. The patient takes great stock in his opinion on this. She says she will go to [place] to have her leg amputated if Dr. Geroge Baseman does not feel there is any salvage options. 11/2; she still is not talk to her orthopedic surgeon in [place]. Apparently he will call her at [phone] this afternoon. The quality of this is she has not allowed me to refer her anywhere. She has been told over and over that she needs this amputated but has not agreed to be referred. She tells me her blood sugar was 600 last night but she has not been febrile. 11/9; she never did got a call from the orthopedic surgeon in [place] therefore that is off the radar. We have arranged to get her see orthopedic surgery at [facility]. She still has a lot of draining purulence coming out of the new abscess in her right leg although that probably came from the osteomyelitis in her right foot and heel. Meanwhile the original wound on the right foot looks very healthy. Continued improvement. The issue is that the last MRI showed osteomyelitis in her right foot extensively she now has an abscess in the right anterior lower leg. There is nobody in [place] who will offer this woman anything but an amputation and to be honest that is probably what she needs. I think she still wants to talk about limb salvage although at this point I just do not see that. She has completed her vancomycin at dialysis which was for the original staph aureus she is still on cefepime for the more recent Klebsiella. She has had a long course of both of these antibiotics which should have benefited the osteomyelitis on the right foot as well as the abscess. 11/16; apparently [place] elective surgery is shut down because of COVID-19 pandemic. I have reached out to some contacts at [facility] to see if we can get her an  orthopedic appointment there. I am concerned about continually leaving this but for the moment everything is static. In fact her original large wound on this foot is closing down. It is the abscess on the right anterior leg that continues to drain purulent serosanguineous material. She is not currently on any antibiotics however she had a prolonged course of vancomycin [1 month] as well as cefepime for a month 02/24/2019 on evaluation today patient appears to be doing better than the last time I saw her. This is not a patient that I typically see. With that being said I am covering for Dr. Dellia Nims this week and again compared to when I last saw her overall the wounds in particular seem to be doing significantly better which is good news. With that being said the patient tells me several disconcerting things. She has not been able to get in to see anyone for potential debridement in regard to her leg wounds although she tells me that she does not think it is necessary any longer because she is taking care of that herself. She noticed a string coming out of the lower wound on her leg over the last week. The patient states that she subsequently decided that we must of pack something in there and started pulling the string out and as it kept coming and coming she realized this was likely her  tendon. With that being said she continued to remove as much of this as she could. She then I subsequently proceeded to using tubes of antibiotic ointment which she will stick down into the wound and then scored as much as she can until she sees it coming out of the other wound opening. She states that in doing this she is actually made things better and there is less redness and irritation. With regard to her foot wound she does have some necrotic tendon and tissue noted in one small corner but again the actual wound itself seems to be doing better with good granulation in general compared to my last evaluation. 12/7;  continued improvement in the wound on the substantial part of the right medial foot. Still a necrotic area inferiorly that required debridement but the rest of this looks very healthy and is contracting. She has 2 wounds on the right lateral leg which were her original drainage sites from her abscess but all of this looks a lot better as well. She has been using silver alginate after putting antibiotic biotic ointment in one wound and watching it come out the other. I have talked to her in some detail today. I had given her names of orthopedic surgeons at [facility] for second opinion on what to do about the right leg. I do not think the patient never called them. She has not been able to get a hold of the orthopedic surgeon in [place] that she had put a lot of faith in as being somebody would give her an opinion that she would trust. I talked to her today and said even if I could get her in to another orthopedic surgeon about the leg which she accept an amputation and she said she would not therefore I am not going to press this issue for the moment 12/14; continued improvement in his substantial wound on the right medial foot. There is still a necrotic area inferiorly with tightly adherent necrotic debris which I have been working on debriding each time she is here. She does not have an orthopedic appointment. Since last time she was here I looked over her cultures which were essentially MRSA on the foot wound and gram-negative rods in the abscess on the anterior leg. 12/21; continued improvement in the area on the right medial foot. She is not up on this much and that is probably a good thing since I do not know it could support continuous ambulation. She has a small area on the right lateral leg which were remanence of the IandD's I did because of the abscess. I think she should probably have prophylactic antibiotics I am going to have to look this over to see if we can make an intelligent  decision here. In the meantime her major wound is come down nicely. Necrotic area inferiorly is still there but looks a lot better 04/06/2019; she has had some improvement in the overall surface area on the right medial foot somewhat narrowedr both but somewhat longer. The areas on the right lateral leg which were initial IandD sites are superficial. Nothing is present on the right heel. We are using silver alginate to the wound areas 1/18; right medial foot somewhat smaller. Still a deep probing area in the most distal recess of the wound. She has nothing open on the right leg. She has a new wound on the plantar aspect of her left fourth toe which may have come from just pulling skin. The patient using Medihoney on  the wound on her foot under silver alginate. I cannot discourage her from this 2/1; 2-week follow-up using silver alginate on the right foot and her left fourth toe. The area on the right dorsal foot is contracted although there is still the deep area in the most distal part of the wound but still has some probing depth. No overt infection 2/15; 2-week follow-up. She continues to have improvement in the surface area on the dorsal right foot. Even the tunneling area from last time is almost closed. The area that was on the plantar part of her left fourth toe over the PIP is indeed closed 3/1; 2-week follow-up. Continued improvement in surface area. The original divot that we have been debriding inferiorly I think has full epithelialization although the epithelialization is gone down into the wound with probably 4 mm of depth. Even under intense illumination I am unable to see anything open here. The remanence of the wound in this area actually look quite healthy. We have been using silver alginate 3/15; 2-week follow-up. Unfortunately not as good today. She has a comma shaped wound on the dorsal foot however the upper part of this is larger. Under illumination debris on the surface She also  tells [place] that she was on her right leg 2 times in the last couple of weeks mostly to reach up for things above her head etc. She felt a sharp pain in the right leg which she thinks is somewhere from the ankle to the knee. The patient has neuropathy and is really uncertain. She cannot feel her foot so she does not think it was coming from there 3/29; 2-week follow-up. Her wound measures smaller. Surface of the wound appears reasonable. She is using silver alginate with underlying Medihoney. She has home health. X-rays I did of her tib-fib last time were negative although it did show arterial calcification 4/12; 2-week follow-up. Her wound measures smaller in length. Using manuka honey with silver alginate on top. She has home health. 4/26; 2-week follow-up. Her wound is smaller but still very adherent debris under illumination requiring debridement she has been using manuka honey with silver alginate. 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 Been using Hydrofera Blue. She is on suppressive Bactrim but apparently she has been taking it twice daily 6/7; I have not seen this wound and about 6 weeks. Since then she was up in [place]. By her own admission she was walking on the foot because she did not have a wheelchair. The wound is not nearly as healthy looking as it was the last time I saw this. We ordered different things for her but she only uses Medihoney and silver alginate. As far as I know she is on suppressive trimethoprim sulfamethoxazole. She does not admit to any fever or chills. Her CBGs apparently are at baseline however she is saying that she feels some discomfort on the lateral part of her ankle I looked over her last inflammatory markers from the summer 2020 at which time she had a deeply necrotic infected wound in this area. On 11/10/2018 her sedimentation rate was 56 and C-reactive  protein 9.9. This was 107 and 29 on 07/29/2018. 6/17; the patient had a necrotic wound the last time she was here on the right dorsal foot. After debridement I did a culture. This showed a very resistant ESBL Klebsiella as well as Enterococcus. Her  x-ray of the foot which was done because of warmth and some discomfort showed bone destruction within the carpal bones involving the navicular acute cuboid lateral middle cuneiforms but essentially unchanged from her prior study which was done on 10/29/2018. The findings were felt to represent chronic osteomyelitis. We did inflammatory markers on her. Her white count was 5.25 sedimentation rate 16 and C-reactive protein at 11.1. Notable for the fact that in August 2020 her CRP was 9.9 and sedimentation rate 56. I have looked at her x-rays. It is true that the bone destruction is very impressive however the patient came into this clinic for the wound on her right foot with pieces of bone literally falling out anteriorly with purulent material. I am not exactly sure I could have expected anything different. She has not been systemically unwell no fever chills or blood sugars have been reasonable. 6/28; she arrives with a right heel closed. The substantial area on the right anterior foot looks healthy. Much better looking surface. I think we can change to [facility] seems to help this previously. She is getting her antibiotics at dialysis she should be just about finished 7/9; changed to [facility] last week. Surface wound looks satisfactory not much change in surface area however. She is going to [place] state next week this is usually a difficult thing for this patient follow-up will be for 2 weeks. 7/23; using Hydrofera Blue. She returns from her trip and the wound looks surprisingly good. Usually when this patient goes on trips she comes back with a lot of problems with the wound. She is saying that she sometimes feels an episodic "crunching"  feeling on the lateral part of the foot. She is neuropathic and not feeling pain but wonders whether this could be a neuropathic dysesthesia. Electronic Signature(s) Signed: 10/23/2019 5:20:32 PM By: Linton Ham MD Entered By: Linton Ham on 10/23/2019 14:23:25 -------------------------------------------------------------------------------- Physical Exam Details Patient Name: Date of Service: [name] 10/23/2019 1:15 PM Medical Record Number: [id number] Patient Account Number: [id number] Date of Birth/Sex: Treating RN: November 13, 1970 (49 y.o. [name] Primary Care Provider: [name], [name] Other Clinician: Referring Provider: Treating Provider/Extender: [name], [name] Weeks in Treatment: 73 Constitutional Sitting or standing Blood Pressure is within target range for patient.. Pulse regular and within target range for patient.Marland Kitchen Respirations regular, non-labored and within target range.. Temperature is normal and within the target range for the patient.Marland Kitchen Appears in no distress. Cardiovascular Pedal pulses in the right foot are palpable was he has now is at the same is no hand-held that he do that. Musculoskeletal I palpated along the lateral part of the right foot which already has had a transmetatarsal amputation. I did not feel anything particularly abnormal.. Integumentary (Hair, Skin) No erythema around the wound or on the lateral foot. Notes Wound exam Right forefoot. The wound looks clean no debridement is required. She has progressive epithelialization inferiorly and rims of epithelialization. Since the last time I saw this patient I think this is an improvement. Electronic Signature(s) Signed: 10/23/2019 5:20:32 PM By: Linton Ham MD Entered By: Linton Ham on 10/23/2019 14:26:31 -------------------------------------------------------------------------------- Physician Orders Details Patient Name: Date of Service: [address] [name] 10/23/2019 1:15 PM Medical Record Number: [id number] Patient Account Number: [id number] Date of Birth/Sex: Treating RN: January 09, 1971 (49 y.o. [name] Primary Care Provider: [name], [name] Other Clinician: Referring Provider: Treating Provider/Extender: [name], [name]  Weeks in Treatment: 61 Verbal / Phone Orders: No Diagnosis Coding ICD-10 Coding Code Description M86.671 Other chronic osteomyelitis, right ankle and foot L97.514 Non-pressure chronic ulcer of other part of right foot with necrosis of bone L97.411 Non-pressure chronic ulcer of right heel and midfoot limited to breakdown of skin E10.621 Type 1 diabetes mellitus with foot ulcer Follow-up Appointments Return appointment in 3 weeks. Dressing Change Frequency Wound #43 Right,Medial Foot Change Dressing every other day. Wound Cleansing Clean wound with Wound Cleanser - or normal saline Primary Wound Dressing Wound #43 Right,Medial Foot Hydrofera Blue - Classic Secondary Dressing Wound #43 Right,Medial Foot Kerlix/Rolled Gauze Dry Gauze Edema Control Elevate legs to the level of the heart or above for 30 minutes daily and/or when sitting, a frequency of: - throughout the day [facility] skilled [facility] for wound care. - Interim Electronic Signature(s) Signed: 10/23/2019 5:20:32 PM By: Linton Ham MD Signed: 10/23/2019 5:51:32 PM By: [name] Entered By: [name] on 10/23/2019 14:17:04 -------------------------------------------------------------------------------- Problem List Details Patient Name: Date of Service: [address] [name] 10/23/2019 1:15 PM Medical Record Number: [id number] Patient Account Number: [id number] Date of Birth/Sex: Treating RN: 06/18/1970 (49 y.o. F) Dwiggins, Shannon Primary Care Provider: [name], [name] Other Clinician: Referring Provider: Treating Provider/Extender: [name],  [name] Weeks in Treatment: 49 Active Problems ICD-10 Encounter Code Description Active Date MDM Diagnosis M86.671 Other chronic osteomyelitis, right ankle and foot 09/03/2018 No Yes L97.514 Non-pressure chronic ulcer of other part of right foot with necrosis of bone 09/03/2018 No Yes L97.411 Non-pressure chronic ulcer of right heel and midfoot limited to breakdown of 09/17/2019 No Yes skin E10.621 Type 1 diabetes mellitus with foot ulcer 09/24/2018 No Yes Inactive Problems ICD-10 Code Description Active Date Inactive Date L97.521 Non-pressure chronic ulcer of other part of left foot limited to breakdown of skin 04/20/2019 04/20/2019 L97.812 Non-pressure chronic ulcer of other part of right lower leg with fat layer exposed 02/24/2019 02/24/2019 Resolved Problems ICD-10 Code Description Active Date Resolved Date L02.415 Cutaneous abscess of right lower limb 12/25/2018 12/25/2018 Electronic Signature(s) Signed: 10/23/2019 5:20:32 PM By: Linton Ham MD Entered By: Linton Ham on 10/23/2019 14:21:38 -------------------------------------------------------------------------------- Progress Note Details Patient Name: Date of Service: [name] 10/23/2019 1:15 PM Medical Record Number: [id number] Patient Account Number: [id number] Date of Birth/Sex: Treating RN: 06/04/1970 (49 y.o. [name] Primary Care Provider: [name], [name] Other Clinician: Referring Provider: Treating Provider/Extender: [name], [name] Weeks in Treatment: 42 Subjective History of Present Illness (HPI) 49 year old diabetic who is known to have type 1 diabetes which is poorly controlled last hemoglobin A1c was 11%. She comes in with a ulcerated area on the left lateral foot which has been there for over 6 months. Was recently she has been treated by [name] of podiatry who saw her last on 05/28/2016. Review of his notes revealed that the patient had incision and drainage  with placement of antibiotic beads to the left foot on 04/11/2016 for possible osteomyelitis of the cuboid bone. Over the last year she's had a history of amputation of the left fifth toe and a femoropopliteal popliteal bypass graft somewhere in April 2017. 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 His note [name] mentions that the patient has been referred to me for further wound care and possibly great candidate for hyperbaric oxygen therapy due to recurrent osteomyelitis. However we do not have  any x-rays of biopsy reports confirming this. He has been on several antibiotics including Bactrim and most recently is on doxycycline for an MRSA. I understand, the patient was not a candidate for IV antibiotics as she has had previous PICC lines which resulted in blood clots in both arms. There was a x-ray report dated 04/04/2016 on [name] notes which showed evidence of fifth ray resection left foot with osteolytic changes noted to the fourth metatarsal and cuboid bone on the left. 06/13/2016 -- had a left foot x-ray which showed no acute fracture or dislocation and no definite radiographic evidence of osteomyelitis. Advanced osteopenia was seen. 06/20/2016 -- she has noticed a new wound on the right plantar foot in the region where she had a callus before. 06/27/16- the patient did have her x-ray of the right foot which showed no findings to suggest osteomyelitis. She saw her endocrinologist, [name], yesterday. Her A1c in January was 11. He also indicates mismanagement and noncompliance regarding her diabetes. She is currently on Bactrim for a lip infection. She is complaining of nausea, vomiting and diarrhea. She is unable to articulate the exact orders or dosing of the Bactrim; it is unclear when she will complete this. 07/04/2016 -- results from [facility] of ABIs with ankle waveforms were noted from 02/14/2016. The examination done on 06/27/2015 showed noncompressible ABIs  with the right being 1.45 and the left being 1.33. The present examination showed a right ABI of 1.19 on the left of 1.33. The conclusion was that right normal ABI in the lower extremity at rest however compared to previous study which was noncompressible ABI may be falsely elevated side suggesting medial calcification. The left ABI suggested medial calcification. 08/01/2016 -- the patient had more redness and pain on her right foot and did not get to come to see as noted she see her PCP or go to the ER and decided to take some leftover metronidazole which she had at home. As usual, the patient does report she feels and is rather noncompliant. 08/08/2016 -- -- x-ray of the right foot -- FINDINGS:Transmetatarsal amputation is noted. No bony destruction is noted to suggest osteomyelitis. IMPRESSION: No evidence of osteomyelitis. Postsurgical changes are seen. MRI would be more sensitive for possible bony changes. Culture has grown Serratia Marcescens -- sensitive to Bactrim, ciprofloxacin, ceftazidime she was seen by [name] on 08/06/2016. He did not find any exposed bone, muscle, tendon, ligament or joint. There was no malodor and he did a excisional debridement in the office. ============ Old notes: 49 year old patient who is known to the wound clinic for a while had been away from the wound clinic since 09/01/2014. Over the last several months she has been admitted to various hospitals including [facility]. She was treated for a right metatarsal osteomyelitis with a transmetatarsal amputation and this was done about 2 months ago. He has a small ulcerated area on the right heel and she continues to have an ulcerated area on the left plantar aspect of the foot. The patient was recently admitted to the [facility] hospital group between 7/12 and 10/18/2014. she was given 3 weeks of IV vancomycin and was to follow-up with her surgeons at [facility] and also took oral vancomycin  for C. difficile colitis. 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 MRI of the left foot done on 10/14/2014 showed no abscess or osteomyelitis. 04/27/15; this is a patient we know from previous stays in the wound care center. She is a type I diabetic I am not sure of her control currently. Since the last time I saw her she is had a right transmetatarsal amputation and has no wounds on her right foot and has no open wounds. She is been followed at the wound care center at [facility] in [place]. She comes today with the desire to undergo hyperbaric treatment locally. Apparently one of her wound care providers in [place] has suggested hyperbarics. This is in response to an MRI from 04/18/15 that showed increased marrow signal and loss of the proximal fifth metatarsal cortex evidence of osteomyelitis with likely early osteomyelitis in the cuboid bone as well. She has a large wound over the base of the fifth metatarsal. She also has a eschar over her the tips of her toes on 1,3 and 5. She does not have peripheral pulses and apparently is going for an angiogram tomorrow which seems reasonable. After this she is going to infectious disease at [facility]. They have been using Medihoney to the large wound on the lateral aspect of the left foot to. The patient has known Charcot deformity from diabetic neuropathy. She also has known diabetic PAD. Surprisingly I can't see that she has had any recent antibiotics, the patient states the last antibiotic she had was at the end of November for 10 days. I think this was in response to culture that showed group G strep although I'm not exactly sure where the culture was from. She  is also had arterial studies on 03/29/15. This showed a right ABI of 1.4 that was noncompressible. Her left ABI was 0.73. 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 It was not felt that arterial inflow was adequate for healing of a foot ulcer. Her Doppler waveforms looked monophasic ===== READMISSION 02/28/17; this is in an now 49 year old woman we've had at several different occasions in this clinic. 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 She has a remote ex-smoker. She was last seen in this clinic by Dr. Con Memos I think in May. More recently she is been followed by her podiatrist [name] an infectious disease Dr. Megan Salon. She has 2 open wounds the major one is over the right first metatarsal head she also has a wound on the left plantar foot. an MRI of the right foot on 01/01/17 showed a soft tissue ulcer along the plantar aspect of the first metatarsal base consistent with osteomyelitis of the first metatarsal stump. Dr. 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 According to the patient this is been open for slightly over a month. She has been on a combination of Cipro 500 twice a day, Zyvox 600 twice a day and Flagyl 500 3 times a day for over a month now as directed by Dr. Megan Salon. cultures of the right foot earlier this year showed MRSA in January and Serratia in May. January also had a few viridans strep. Recent x-rays of both feet were done and [name] office and I don't have these reports. The patient has known PAD and has a history of aleft femoropopliteal bypass in April 2017. 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 Dwyane Dee. In spite of this the last hemoglobin A1c I can see is 10.1 on 01/01/2017. She is being referred by Dr.  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 She is been using Medihoney to this area and also an area on the left midfoot. She is using healing sandals bilaterally. ABIs in this clinic at the left posterior tibial was 1.1 noncompressible on the right READMISSION Non invasive vascular NOVANT 5/18 Aftercare following surgery of the circulatory system Procedure Note - Interface, External Ris In - 08/13/2016 11:05 AM EDT Procedure: Examination consists of physiologic resting arterial pressures of the brachial and ankle arteries bilaterally with continuous wave Doppler waveform analysis. Previous: Previous exam performed on 02/14/16 demonstrated ABIs of Rt = 1.19 and Lt = 1.33. Right: ABI = non-compressible PT 1.47 DP. S/P transmet amputation. , Left: ABI = 1.52, 2nd digit pressure = 87 mmHg Conclusions: Right: ABI (>1.3) may be falsely elevated, suggesting medial calcification. Left: ABI (>1.3) may be falsely elevated, suggesting medial calcification The patient is a now 49 year old type I diabetic is had multiple issues her graded to chronic diabetic foot ulcers. 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 We had her in the clinic lastin November. At that point she had wounds on her bilateral feet.she had wanted to try hyperbarics however the healogics review process denied her because she hadn't followed up with her vascular surgeon for her left femoropopliteal bypass. The bypass was done by Dr. Raul Del at [facility]. We made her a follow-up with Dr. Raul Del however she did not keep the appointment and therefore she was not approved The patient shows me a small wound on her left fourth metatarsal head on her phone. 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 She was admitted acutely ill with a temperature of 103. She was started on broad-spectrum vancomycin and cefepime. On 05/06/17 she was taken to the OR by [name] her podiatric surgeon for an incision and drainage irrigation of the left foot wound. Cultures from this surgery revealed group be strep and anaerobes. she was seen by Dr.Xu of orthopedic surgery and scheduled for a below-knee amputation which she u refused. Ultimately she was discharged on Levaquin and Flagyl for one month. MRI 05/05/17 done while she was in the hospital showed abscess adjacent to the fourth metatarsal head and neck small abscess around the fourth flexor tendon. Inflammatory phlegmon and gas in the soft tissues along the lateral aspect of the fourth phalanx. Findings worrisome for osteomyelitis involving the fourth proximal and middle phalanx and also the third and fourth metatarsals. Finally the patient had actually shortly before this followed up with Dr. Raul Del at no time on 04/29/17. He felt that her left femoropopliteal bypass was patent he felt that her left-sided toe pressures more than adequate for healing a wound on the left foot. This was before her acute presentation. 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 05/28/17; she is started hyperbarics. The patient tells me that for some reason she was not actually on Levaquin but I think on ciprofloxacin. She was on Flagyl. She only started her Levaquin yesterday due to some difficulty  with the pharmacy and perhaps her sister picking it up. She has an appointment with [name] tomorrow and with infectious disease early next week. She has no new complaints 06/06/17; the patient continues in hyperbarics. She saw [name] on 05/29/17 who is her podiatric surgeon. He is elected for a transmetatarsal amputation on 06/27/17. I'm not sure at what level he  plans to do this amputation. The patient is unaware ooShe also saw Dr. Megan Salon of infectious disease who elected to continue her on current antibiotics I think this is ciprofloxacin and Flagyl. I'll need to clarify with her tomorrow if she actually has this. We're using silver alginate to the actual wound. Necrotic surface today with material under the flap of her foot. ooOriginal MRI showed abscesses as well as osteomyelitis of the proximal and middle fourth phalanx and the third and fourth metatarsal heads 06/11/17; patient continues in hyperbarics and continues on oral antibiotics. She is doing well. The wound looks better. The necrotic part of this under the flap in her superior foot also looks better. she is been to see [name]. I haven't had a chance to look at his note. Apparently he has put the transmetatarsal amputation on hold her request it is still planning to take her to the OR for debridement and product application ACEL. I'll see if I can find his note. I'll therefore leave product ordering/requests to [name] for now. I was going to look at Dermagraft 06/18/17-she is here in follow-up evaluation for bilateral foot wounds. She continues with hyperbaric therapy. She states she has been applying manuka honey to the right plantar foot and alternate manuka honey and silver alginate to the left foot, despite our orders. We will continue with same treatment plan and she will follo up next week. 06/25/17; I have reviewed [name] last note from 3/11. She has operative debridement in 2 days' time. By review his note apparently they're going to place there is skin over the majority of this wound which is a good choice. She has a small satellite area at the most proximal part of this wound on the left plantar foot. The area on the right plantar foot we've been using silver alginate and it is close to healing. 07/02/17; unfortunately the patient was not easily approved for [name] proposed  surgery. I'm not completely certain what the issue is. She has been using silver alginate to the wound she has completed a first course of hyperbarics. She is still on Levaquin and Flagyl. I have really lost track of the time course here.I suspect she should have another week to 2 of antibiotics. I'll need to see if she is followed up with infectious disease Dr. Megan Salon 07/09/17; the patient is followed up with Dr. Megan Salon. 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 She continues on Levaquin and metronidazole continuing both of these for now I think she is been on fr about 6 weeks. She still has some drainage but no pain. No fever. Her had been plans for her to go to the OR for operative debridement with her podiatrist [name], I am not exactly sure where that is. I'll probably slip a note to [name] today. I note that she follows with Dr. Dwyane Dee of endocrinology. We have her recertified for hyperbaric oxygen. I have not heard about Dermagraft however I'll see if [name] is planning a skin substitute as well 07/16/17; the patient tells me she  is just about out of Levaquin. I'll need to check Dr. Hale Bogus last notes on this. She states she has plenty of Flagyl however. She comes in today complaining of pain in the right lateral foot which she said lasted for about a day. The wound on the right foot is actually much more medially. She also tells me that the [facility] cost a lot of pain in the left foot wound and she turned back to silver alginate. Finally Dermagraft has a $035 per application co-pay. She cannot afford this 07/23/17; patient arrives today with the wound not much smaller. There is not much new to add. She has not heard from [name] all try to put in a call to them today. She was asking about Dermagraft again and she has an over $009 per application co-pay she states that she would be willing to try to do a payment plan. I been tried to  avoid this. We've been using silver alginate, I'll change to [facility] 07/30/17-She is here in follow-up evaluation for left foot ulcer. She continues hyperbaric medicine. The left foot ulcer is stable we will continue with same treatment plan 08/06/17; she is here for evaluation of her left foot ulcer. Currently being treated for hyperbarics or underlying osteomyelitis. She is completed antibiotics. The left foot ulcer is better smaller with healthier looking granulation. For various reasons I am not really clear on we never got her back to the OR with [name]. He did not respond to my secure text message. Nevertheless I think that surgery on this point is not necessary nor am I completely clear that a skin substitute is necessary The patient is complaining about pain on the outside of her right foot. 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 There is no erythema. She also states the foot is warm versus her other part of her upper leg and this is largely true. It is not totally clear to me what's causing this. She thinks it's different from her usual neuropathy pain 08/13/17; she arrives in clinic today with a small wound which is superficial on her right first metatarsal head. 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 She tells [place] she was up on her feet over the Mother's Day celebration. ooThe large wound is on the left foot. Continues with hyperbarics for underlying osteomyelitis. We're using Hydrofera Blue. She asked me today about where we were with Dermagraft. I had actually excluded this because of the co-pay however she wants to assume this therefore I'll recheck the co-pay an order for next week. 08/20/17; the patient agreed to accept the co-pay of the first Apligraf which we applied today. She is disappointed she is finishing hyperbarics will run this through the insurance on the extent of the foot infection and the extent of the wound that she had however  she is already had 60 dive's. Dermagraft No. 1 08/27/17; Dermagraft No. 2. She is not eligible for any more hyperbaric treatments this month. She reports a fair amount of drainage and she actually changed to the external dressings without disturbing the direct contact layer 09/03/17; the patient arrived in clinic today with the wound superficially looking quite healthy. Nice vibrant red tissue with some advancing epithelialization although not as much adherence of the flap as I might like. However she noted on her own fourth toe some bogginess and she brought that to our attention. Indeed this was boggy feeling like a possibility of subcutaneous fluid. She stated that this  was similar to how an issue came up on the lateral foot that led to her fifth ray amputation. She is not been unwell. We've been using Dermagraft 09/10/17; the culture that I did not last week was MRSA. She saw Dr. Megan Salon this morning who is going to start her on vancomycin. I had sent him a secure a text message yesterday. I also spoke with her podiatric surgeon [name] about surgery on this foot the options for conserving a functional foot etc. Promised me he would see her and will make back consultation today. Paradoxically her actual wound on the plantar aspect of her left foot looks really quite good. I had given her 5 days worth of Baxdella to cover her for MRSA. Her MRI came back showing osteomyelitis within the third metatarsal shaft and head and base of the third and fourth proximal phalanx. She had extensive inflammatory changes throughout the soft tissue of the lateral forefoot. With an ill-defined fluid around the fourth metatarsal extending into the plantar and dorsal soft tissues 09/19/17; the patient is actually on oral Septra and Flagyl. She apparently refused IV vancomycin. She also saw [name] at my request who is planning her for a left BKA sometime in mid July. MRI showed osteomyelitis within the third  metatarsal shaft and head and the basis of the third and fourth proximal phalanx. I believe there was felt to be possible septic arthritis involving the third MTP. 09/26/17; the patient went back to Dr. Megan Salon at my suggestion and is now receiving IV daptomycin. Her wound continues to look quite good making the decision to proceed with a transmetatarsal amputation although more difficult for the patient. I believe in my extensive discussions with her she has a good sense of the pros and cons of this. I don't NV the tuft decision she has to make. She has an appointment with [name] I believe in mid July and I previously spoken to him about this issue Has we had used 3 previous Dermagraft. Given the condition of the wound surface I went ahead and added the fourth one today area and I did this not fully realizing that she'll be traveling to [place] next week. I'm hopeful she can come back in 2 weeks 10/21/17; Her same Dermagraft on for about 3-1/2 weeks. In spite of this the wound arrives looking quite healthy. There is been a lot of healing dimensions are smaller. Looking at the square shaped wound she has now there is some undermining and some depth medially under the undermining although I cannot palpate any bone. No surrounding infection is obvious. She has difficult questions about how to look at this going forward vis--vis amputations versus continued medical therapy. T be truthful the wound is looks so o healthy and it is continued to contract. Hard to justify foot surgery at this point although I still told her that I think it might come to that if we are not able to eradicate the underlying MRSA. She is still highly at risk and she understands this 11/06/17 on evaluation today patient appears to be doing better in regard to her foot ulcer. She's been tolerating the dressing changes without complication. Currently she is here for her Dermagraft #6. Her wound continues to make excellent  progress at this point. She does not appear to have any evidence of infection which is good news. 11/13/17 on evaluation today patient appears to be doing excellent at this time. She is here for repeat Dermagraft application. This is #  7. Overall her wound seems to be making great progress. 12/05/17; the patient arrives with the wound in much better condition than when I last saw this almost 6 weeks ago. She still has a small probing area in the left metatarsal head region on the lateral aspect of her foot. We applied her last Dermagraft today. ooSince the last time she is here she has what appears to a been a blood blister on the plantar aspect of left foot although I don't see this is threatening. There is also a thick raised tissue on the right mid metatarsal head region. This was not there I don't think the last time she was here 3 weeks ago. 12/12/17; the patient continues to have a small programming area in the left metatarsal head region on the lateral aspect of her foot which was the initial large surgical wound. I applied her last Apligraf last week. I'm going to use Endoform starting today ooUnfortunately she has an excoriated area in the left mid foot and the right mid foot. The left midfoot looks like a blistered area this was not opened last week it certainly is open today. Using silver alginate on these areas. She promises me she is offloading this. 12/19/17; the small probing area in the left metatarsal head eyes think is shallower. In general her original wound looks better. We've been using Endoform. The area inferiorly that I think was trauma last week still requires debridement a lot of nonviable surface which I removed. She still has an open open area distally in her foot ooSimilarly on the right foot there is tightly adherent surface debris which I removed. Still areas that don't look completely epithelialized. This is a small open area. We used silver alginate on these  areas 12/26/2017; the patient did not have the supplies we ordered from last week including the Endoform. The original large wound on the left lateral foot looks healthy. She still has the undermining area that is largely unchanged from last week. She has the same heavily callused raised edged wounds on the right mid and left midfoot. Both of these requiring debridement. We have been using silver alginate on these areas 01/02/2018; there is still supply issues. We are going to try to use Prisma but I am not sure she actually got it from what she is saying. She has a new open area on the lateral aspect of the left fourth toe [previous fifth ray amputation]. Still the one tunneling area over the fourth metatarsal head. The area is in the midfoot bilaterally still have thick callus around them. She is concerned about a raised swelling on the lateral aspect of the foot. However she is completely insensate 01/10/2018; we are using Prisma to the wounds on her bilateral feet. Surprisingly the tunneling area over the left fourth metatarsal head that was part of her original surgery has closed down. She has a small open area remaining on the incision line. 2 open areas in the midfoot. 02/10/2018; the patient arrives back in clinic after a month hiatus. She was traveling to visit family in [place]. Is fairly clear she was not offloading the areas on her feet. The original wound over the left lateral foot at the level of metatarsal heads is reopened and probes medially by about a centimeter or 2. She notes that a week ago she had purulent drainage come out of an area on the left midfoot. Paradoxically the worst area is actually on the right foot is extensive with purulent drainage.  We will use silver alginate today 02/17/2018; the patient has 3 wounds one over the left lateral foot. She still has a small area over the metatarsal heads which is the remnant of her original surgical wound. This has medial probing  depth of roughly 1.4 cm somewhat better than last week. The area on the right foot is larger. We have been using silver alginate to all areas. The area on the right foot and left foot that we cultured last week showed both Klebsiella and Proteus. Both of these are quinolone sensitive. The patient put her's self on Bactrim and Flagyl that she had left hanging around from prior antibiotic usages. She was apparently on this last week when she arrived. I did not realize this. Unfortunately the Bactrim will not cover either 1 of these organisms. We will send in Cipro 500 twice daily for a week 03/04/2018; the patient has 2 wounds on the left foot one is the original wound which was a surgical wound for a deep DFU. At one point this had exposed bone. She still has an area over the fourth metatarsal head that probes about 1.4 cm although I think this is better than last week. I been using silver nitrate to try and promote tissue adherence and been using silver alginate here. ooShe also has an area in the left midfoot. This has some depth but a small linear wound. Still requiring debridement. ooOn the right midfoot is a circular wound. A lot of thick callus around this area. ooWe have been using silver alginate to all wound areas ooShe is completed the ciprofloxacin I gave her 2 weeks ago. 03/11/2018; the patient continues to have 2 open areas on the left foot 1 of which was the original surgical wound for a deep DFU. Only a small probing area remains although this is not much different from last week we have been using silver alginate. The other area is on the midfoot this is smaller linear but still with some depth. We have been using silver alginate here as well ooOn the right foot she has a small circular wound in the mid aspect. This is not much smaller than last time. We have been using silver alginate here as well 03/18/2018; she has 3 wounds on the left foot the original surgical wound, a very  superficial wound in the mid aspect and then finally the area in the mid plantar foot. She arrives in today with a very concerning area in the wound in the mid plantar foot which is her most proximal wound. There is undermining here of roughly 1-1/2 cm superiorly. Serosanguineous drainage. She tells me she had some pain on for over the weekend that shot up her foot into her thigh and she tells me that she had a nodule in the groin area. ooShe has the single wound in the right foot. ooWe are using endoform to both wound areas 03/24/2018; the patient arrives with the original surgical wound in the area on the left midfoot about the same as last week. There is a collection of fluid under the surface of the skin extending from the surgical wound towards the midfoot although it does not reach the midfoot wound. The area on the right foot is about the same. Cultures from last week of the left midfoot wound showed abundant Klebsiella abundant Enterococcus faecalis and moderate methicillin resistant staph I gave her Levaquin but this would have only covered the Klebsiella. She will need linezolid 04/01/2018; she is taking linezolid but  for the first few days only took 1 a day. I have advised her to finish this at twice daily dosing. In any case all of her wounds are a lot better especially on the left foot. The original surgical wound is closed. The area on the left midfoot considerably smaller. The area on the right foot also smaller. 04/08/2018; her original surgical wound/osteomyelitis on the left foot remains closed. She has area on the left foot that is in the midfoot area but she had some streaking towards this. This is not connected with her original wound at least not visually. ooSmall wound on the right midfoot appears somewhat smaller. 04/15/18; both wounds looks better. Original wound is better left midfoot. Using silver alginate 1/21; patient states she uses saltwater soak in, stones or remove  callus from around her wounds. She is also concerned about a blood blister she had on the left foot but it simply resolved on its own. We've been using silver alginate 1/28; the patient arrives today with the same streaking area from her metatarsals laterally [the site of her original surgical wound] down to the middle of her foot. There is some drainage in the subcutaneous area here. This concerns me that there is actually continued ongoing infection in the metatarsals probably the fourth and third. This fixates an MRI of the foot without contrast [chronic renal failure] ooThe wound in the mid part of the foot is small but I wonder whether this area actually connects with the more distal foot. ooThe area on the right midfoot is probably about the same. Callus thick skin around the small wound which I removed with a curette we have been using silver alginate on both wound areas 2/4; culture I did of the draining site on the left foot last time grew methicillin sensitive staph aureus. MRI of the left foot showed interval resolution of the findings surrounding the third metatarsal joint on the prior study consistent with treated osteomyelitis. Chronic soft tissue ulceration in the plantar and lateral aspect of the forefoot without residual focal fluid collection. No evidence of recurrent osteomyelitis. 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 She tells me she had nausea and vomiting over the weekend and missed 2 days. In general her foot looks much the same. She has a small open area just below the left fourth metatarsal head. A linear area in the left midfoot. Some discoloration extending from the inferior part of this into the left lateral foot although  this appears to be superficial. She has a small area on the right midfoot which generally looks smaller after debridement 2/18; the patient is completing his cephalexin and has another 2 days. She continues to have open areas on the left and right foot. 2/25; she is now off antibiotics. The area on the left foot at the site of her original surgical wound has closed yet again. She still has open areas in the mid part of her foot however these appear smaller. The area on the right mid foot looks about the same. We have been using silver alginate She tells me she had a serious hypoglycemic spell at home. She had to have EMS called and get IV dextrose 3/3; disappointing on the left lateral foot large area of necrotic  tissue surrounding the linear area. This appears to track up towards the same original surgical wound. Required extensive debridement. The area on the right plantar foot is not a lot better also using silver 3/12; the culture I did last time showed abundant enterococcus. I have prescribed Augmentin, should cover any unrecognized anaerobes as well. In addition there were a few MRSA and Serratia that would not be well covered although I did not want to give her multiple antibiotics. She comes in today with a new wound in the right midfoot this is not connected with the original wound over her MTP a lot of thick callus tissue around both wounds but once again she said she is not walking on these areas 3/17-Patient comes in for follow-up on the bilateral plantar wounds, the right midfoot and the left plantar wound. Both these are heavily callused surrounding the wounds. We are continuing to use silver alginate, she is compliant with offloading and states she uses a wheelchair fairly often at home 3/24; both wound areas have thick callus. However things actually look quite a bit better here for the majority of her left foot and the right foot. 3/31; patient continues to have thick callused  somewhat irritated looking tissue around the wounds which individually are fairly superficial. There is no evidence of surrounding infection. We have been using silver alginate however I change that to [facility] today 4/17; patient returns to clinic after having a scare with Covid she tested negative in her primary doctor's office. She has been using Hydrofera Blue. She does not have an open area on the right foot. On the left foot she has a small open area with the mid area not completely viable. She showed me pictures of what looks like a hemorrhagic blister from several days ago but that seems to have healed over this was on the lateral left foot 4/21; patient comes in to clinic with both her wounds on her feet closed. However over the weekend she started having pain in her right foot and leg up into the thigh. She felt as though she was running a low-grade fever but did not take her temperature. She took a doxycycline that she had leftover and yesterday a single Septra and metronidazole. She thinks things feel somewhat better. 4/28; duplex ultrasound I ordered last week was negative for DVT or superficial thrombophlebitis. She is completed the doxycycline I gave her. States she is still having a lot of pain in the right calf and right ankle which is no better than last week. She cannot sleep. She also states she has a temperature of up to 101, coughing and complaining of visual loss in her bilateral eyes. Apparently she was tested for Covid 2 weeks ago at [facility] and that was negative. Readmission: 09/03/18 patient presents back for reevaluation after having been evaluated at the end of April regarding erythema and swelling of her right lower extremity. Subsequently she ended up going to the hospital on 07/29/18 and was admitted not to be discharged until 08/08/18. Unfortunately it was noted during the time that she was in the hospital that she did have methicillin-resistant Staphylococcus aureus as  the infection noted at the site. It was also determined that she did have osteomyelitis which appears to be fairly significant. She was treated with vancomycin and in fact is still on IV vancomycin at dialysis currently. This is actually slated to continue until 09/12/18 at least which will be the completion of the six weeks of therapy. Nonetheless  based on what I'm seeing at this point I'm not sure she will be anywhere near ready to discontinue antibiotics at that time. Since she was released from the hospital she was seen by [name] who is her podiatrist on 08/27/18. His note specifically states that he is recommended that the patient needs of one knee amputation on the right as she has a life- threatening situation that can lead quickly to sepsis. The patient advised she would like to try to save her leg to which [name] apparently told her that this was against all medical advice. She also want to discontinue the Wound VAC which had been initiated due to the fact that she wasn't pleased with how the wound was looking and subsequently she wanted to pursue applying Medihoney at that time. He stated that he did not believe that the right lower extremity was salvageable and that the patient understood but would still like to attempt hyperbaric option therapy if it could be of any benefit. She was therefore referred back to [place] for further evaluation. He plans to see her back next week. Upon inspection today patient has a significant amount purulent drainage noted from the wound at this point. The bone in the distal portion of her foot also appears to be extremely necrotic and spongy. When I push down on the bone it bubbles and seeps purulent drainage from deeper in the end of the foot. I do not think that this is likely going to heal very well at all and less aggressive surgical debridement were undertaken more than what I believe we can likely do here in our office. 09/12/2018; I have not seen this  patient since the most recent hospitalization although she was in our clinic last week. I have reviewed some of her records from a complex hospitalization. 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 There is situation was complicated by MRSA bacteremia and acute on chronic renal failure now on dialysis. She is receiving vancomycin at dialysis. We started her on Dakin's wet-to-dry last week she is changing this daily. There is still purulent drainage coming out of her foot. Although she is apparently "agreeable" to a below-knee amputation which is been suggested by multiple clinicians she wants this to be done in [place]. She apparently has a telehealth visit with that provider sometime in late [place] 6/24. I have told her I think this is probably too long. Nevertheless I could not convince her to allow a local doctor to perform BKA. 09/19/2018; the patient has a large necrotic area on the right anterior foot. She has had previous transmetatarsal amputations. Culture I did last week showed MRSA nothing else she is on vancomycin at dialysis. She has continued leaking purulent drainage out of the distal part of the large circular wound on the right anterior foot. She apparently went to see Dr. 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 Somehow that translated into her being referred to plastic surgery for debridement of the area. I gather she basically refused amputation although I do not have a copy of Dr. Berenice Primas notes. The patient really wants to have a trial of hyperbaric oxygen. I agreed with initial assessment in this clinic that this was probably too far along to benefit however if she is going to have plastic surgery I think she would benefit from ancillary hyperbaric oxygen. The issue here is that the patient has benefited as maximally as any patient I  have ever seen from hyperbaric oxygen therapy. Most recently she  had exposed bone on the lateral part of her left foot after a surgical procedure and that actually has closed. She has eschared areas in both heels but no open area. She is remained systemically well. I am not optimistic that anything can be done about this but the patient is very clear that she wants an attempt. The attempt would include a wound VAC further debridements and hyperbaric oxygen along with IV antibiotics. 6/26; I put her in for a trial of hyperbaric oxygen only because of the dramatic response she has had with wounds on her left midfoot earlier this year which was a surgical wound that went straight to her bone over the metatarsal heads and also remotely the left third toe. We will see if we can get this through our review process and insurance. She arrives in clinic with again purulent material pouring out of necrotic bone on the top of the foot distally. There is also some concerning erythema on the front of the leg that we marked. It is bit difficult to tell how tender this is because of neuropathy. I note from infectious disease that she had her vancomycin extended. 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 She had the wound VAC on for part of the week. The rest of the time she is putting various things on this including Medihoney, "ionized water" silver sorb gel etc. 7/7; follow-up along with HBO. She is still on vancomycin at dialysis. She has a large open area on the dorsal right foot and a small dark eschar area on her heel. There is a lot less erythema in the area and a lot less tenderness. From an infection point of view I think this is better. She still has a lot of necrosis in the remaining right forefoot [previous TMA] we are still using the wound VAC in this area 7/16; follow-up along with HBO. I put her on linezolid after she finished her vancomycin. We started this last Friday I gave her 2 weeks worth. I had the expectation that she would be  operatively debrided by Dr. Marla Roe but that still has not happened yet. Patient phoned the office this week. She arrives for review today after HBO. The distal part of this wound is completely necrotic. Nonviable pieces of tendon bone was still purulent drainage. Also concerning that she has black eschar over the heel that is expanding. I think this may be indicative of infection in this area as well. She has less erythema and warmth in the ankle and calf but still an abnormal exam 7/21 follow-up along with HBO. I will renew her linezolid after checking a CBC with differential monitoring her blood counts especially her platelets. She was supposed to have surgery yesterday but if I am reading things correctly this was canceled after her blood sugar was found to be over 500. I thought Dr. Marla Roe who called me said that they were sending her to the ER but the patient states that was not the case. 7/28. Follow-up along with HBO. She is on linezolid I still do not have any lab work from dialysis even though I called last week. The patient is concerned about an area on her left lateral foot about the level of the base of her fifth metatarsal. I did not really see anything that ominous here however this patient is in [place] ability to point out problems that she is sensing and  she has been accurate in the past Finally she received a call from Dr. Marla Roe who is referring her to another orthopedic surgeon stating that she is too booked up to take her to the operating room now. Was still using a wound VAC on the foot 8/3 -Follow-up after HBO, she is got another week of linezolid, she is to call [facility] for an appointment, x-rays of both feet were reviewed, the left foot x-ray with third MTP joint osteo- Right foot x-ray widespread osteo-in the right midfoot Right ankle x-ray does not show any active evidence of infection 8/11-Patient is seen after HBO, the wounds on the right foot appear to be about the  same, the heel wound had some necrotic base over tendon that was debrided with a curette 8/21; patient is seen after HBO. The patient's wound on her dorsal foot actually looks reasonably good and there is substantial amount of epithelialization however the open area distally still has a lot of necrotic debris partially bone. I cannot really get a good sense of just how deep this probes under the foot. She has been pressuring me this week to order medical maggots through a company in [place] for her. The problem I have is there is not a defined wound area here. On the positive side there is no purulence. She has been to see infectious disease she is still on Septra DS although I have not had a chance to review their notes 8/28; patient is seen in conjunction with HBO. The wounds on her foot continued to improve including the right dorsal foot substantially the, the distal part of this wound and the area on the right heel. We have been using a wound VAC over this chronically. She is still on trimethoprim as directed by infectious disease 9/4; patient is seen in conjunction with HBO. Right dorsal foot wound substantially anteriorly is better however she continues to have a deep wound in the distal part of this that is not responding. We have been using silver collagen under border foam ooArea on the right plantar medial heel seems better. We have been using Hydrofera Blue 12/12/18 on evaluation today patient appears to be doing about the same with regard to her wound based on prior measurements. She does have some necrotic tissue noted on the lateral aspect of the wound that is going require a little bit of sharp debridement today. This includes what appears to be potentially either severely necrotic bone or tendon. Nonetheless other than that she does not appear to have any severe infection which is good news 9/18; it is been 2 weeks since I saw this wound. She is tolerating HBO well. Continued  dramatic improvement in the area on the right dorsal foot. She still has a small wound on the heel that we have been using Hydrofera Blue. She continues with a wound VAC 9/24; patient has to be seen emergently today with a swelling on her right lateral lower leg. She says that she told Dr. Evette Doffing about this and also myself on a couple of occasions but I really have no recollection of this. She is not systemically unwell and her wound really looked good the last time I saw this. She showed this to providers at dialysis and she was able to verify that she was started on cephalexin today for 5 doses at dialysis. She dialyzes on Tuesday Thursday and Saturday. 10/2; patient is seen in conjunction with HBO. The area that is draining on the right anterior medial tibia  is more extensive. Copious amounts of serosanguineous drainage with some purulence. We are still using the wound VAC on the original wound then it is stable. Culture I did of the original IandD showed MRSA I contacted dialysis she is now on vancomycin with dialysis treatments. I asked them to run a month 10/9; patient seen in conjunction with HBO. She had a new spontaneous open area just above the wound on the right medial tibia ankle. More swelling on the right medial tibia. Her wound on the foot looks about the same perhaps slightly better. There is no warmth spreading up her leg but no obvious erythema. her MRI of the foot and ankle and distal tib-fib is not booked for next Friday I discussed this with her in great detail over multiple days. it is likely she has spreading infection upper leg at least involving the distal 25% above the ankle. She knows that if I refer her to orthopedics for infectious disease they are going to recommend amputation and indeed I am not against this myself. We had a good trial at trying to heal the foot which is what she wanted along with antibiotics debridement and HBO however she clearly has spreading  infection [probably staph aureus/MRSA]. Nevertheless she once again tells me she wants to wait the left of the MRI. She still makes comments about having her amputation done in [place]. 10/19; arrives today with significant swelling on the lateral right leg. Last culture I did showed Klebsiella. Multidrug-resistant. Cipro was intermediate sensitivity and that is what I have her on pending her MRI which apparently is going to be done on Thursday this week although this seems to be moving back and forth. She is not systemically unwell. We are using silver alginate on her major wound area on the right medial foot and the draining areas on the right lateral lower leg 10/26; MRI showed extensive abscess in the anterior compartment of the right leg also widespread osteomyelitis involving osseous structures of the midfoot and portions of the hindfoot. Also suspicion for osteomyelitis anterior aspect of the distal medial malleolus. Culture I did of the purulence once again showed a multidrug-resistant Klebsiella. I have been in contact with nephrology late last week and she has been started on cefepime at dialysis to replace the vancomycin We sent a copy of her MRI report to Dr. Geroge Baseman in [place] who is an orthopedic surgeon. The patient takes great stock in his opinion on this. She says she will go to [place] to have her leg amputated if Dr. Geroge Baseman does not feel there is any salvage options. 11/2; she still is not talk to her orthopedic surgeon in [place]. Apparently he will call her at [phone] this afternoon. The quality of this is she has not allowed me to refer her anywhere. She has been told over and over that she needs this amputated but has not agreed to be referred. She tells me her blood sugar was 600 last night but she has not been febrile. 11/9; she never did got a call from the orthopedic surgeon in [place] therefore that is off the radar. We have arranged to get her see  orthopedic surgery at [facility]. She still has a lot of draining purulence coming out of the new abscess in her right leg although that probably came from the osteomyelitis in her right foot and heel. Meanwhile the original wound on the right foot looks very healthy. Continued improvement. The issue is that the last MRI showed osteomyelitis in her  right foot extensively she now has an abscess in the right anterior lower leg. There is nobody in [place] who will offer this woman anything but an amputation and to be honest that is probably what she needs. I think she still wants to talk about limb salvage although at this point I just do not see that. She has completed her vancomycin at dialysis which was for the original staph aureus she is still on cefepime for the more recent Klebsiella. She has had a long course of both of these antibiotics which should have benefited the osteomyelitis on the right foot as well as the abscess. 11/16; apparently [place] elective surgery is shut down because of COVID-19 pandemic. I have reached out to some contacts at [facility] to see if we can get her an orthopedic appointment there. I am concerned about continually leaving this but for the moment everything is static. In fact her original large wound on this foot is closing down. It is the abscess on the right anterior leg that continues to drain purulent serosanguineous material. She is not currently on any antibiotics however she had a prolonged course of vancomycin [1 month] as well as cefepime for a month 02/24/2019 on evaluation today patient appears to be doing better than the last time I saw her. This is not a patient that I typically see. With that being said I am covering for Dr. Dellia Nims this week and again compared to when I last saw her overall the wounds in particular seem to be doing significantly better which is good news. With that being said the patient tells me several disconcerting things. She has  not been able to get in to see anyone for potential debridement in regard to her leg wounds although she tells me that she does not think it is necessary any longer because she is taking care of that herself. She noticed a string coming out of the lower wound on her leg over the last week. The patient states that she subsequently decided that we must of pack something in there and started pulling the string out and as it kept coming and coming she realized this was likely her tendon. With that being said she continued to remove as much of this as she could. She then I subsequently proceeded to using tubes of antibiotic ointment which she will stick down into the wound and then scored as much as she can until she sees it coming out of the other wound opening. She states that in doing this she is actually made things better and there is less redness and irritation. With regard to her foot wound she does have some necrotic tendon and tissue noted in one small corner but again the actual wound itself seems to be doing better with good granulation in general compared to my last evaluation. 12/7; continued improvement in the wound on the substantial part of the right medial foot. Still a necrotic area inferiorly that required debridement but the rest of this looks very healthy and is contracting. She has 2 wounds on the right lateral leg which were her original drainage sites from her abscess but all of this looks a lot better as well. She has been using silver alginate after putting antibiotic biotic ointment in one wound and watching it come out the other. I have talked to her in some detail today. I had given her names of orthopedic surgeons at [facility] for second opinion on what to do about the right leg. I  do not think the patient never called them. She has not been able to get a hold of the orthopedic surgeon in [place] that she had put a lot of faith in as being somebody would give her an opinion  that she would trust. I talked to her today and said even if I could get her in to another orthopedic surgeon about the leg which she accept an amputation and she said she would not therefore I am not going to press this issue for the moment 12/14; continued improvement in his substantial wound on the right medial foot. There is still a necrotic area inferiorly with tightly adherent necrotic debris which I have been working on debriding each time she is here. She does not have an orthopedic appointment. Since last time she was here I looked over her cultures which were essentially MRSA on the foot wound and gram-negative rods in the abscess on the anterior leg. 12/21; continued improvement in the area on the right medial foot. She is not up on this much and that is probably a good thing since I do not know it could support continuous ambulation. She has a small area on the right lateral leg which were remanence of the IandD's I did because of the abscess. I think she should probably have prophylactic antibiotics I am going to have to look this over to see if we can make an intelligent decision here. In the meantime her major wound is come down nicely. Necrotic area inferiorly is still there but looks a lot better 04/06/2019; she has had some improvement in the overall surface area on the right medial foot somewhat narrowedr both but somewhat longer. The areas on the right lateral leg which were initial IandD sites are superficial. Nothing is present on the right heel. We are using silver alginate to the wound areas 1/18; right medial foot somewhat smaller. Still a deep probing area in the most distal recess of the wound. She has nothing open on the right leg. She has a new wound on the plantar aspect of her left fourth toe which may have come from just pulling skin. The patient using Medihoney on the wound on her foot under silver alginate. I cannot discourage her from this 2/1; 2-week follow-up using  silver alginate on the right foot and her left fourth toe. The area on the right dorsal foot is contracted although there is still the deep area in the most distal part of the wound but still has some probing depth. No overt infection 2/15; 2-week follow-up. She continues to have improvement in the surface area on the dorsal right foot. Even the tunneling area from last time is almost closed. The area that was on the plantar part of her left fourth toe over the PIP is indeed closed 3/1; 2-week follow-up. Continued improvement in surface area. The original divot that we have been debriding inferiorly I think has full epithelialization although the epithelialization is gone down into the wound with probably 4 mm of depth. Even under intense illumination I am unable to see anything open here. The remanence of the wound in this area actually look quite healthy. We have been using silver alginate 3/15; 2-week follow-up. Unfortunately not as good today. She has a comma shaped wound on the dorsal foot however the upper part of this is larger. Under illumination debris on the surface She also tells [place] that she was on her right leg 2 times in the last couple of weeks mostly  to reach up for things above her head etc. She felt a sharp pain in the right leg which she thinks is somewhere from the ankle to the knee. The patient has neuropathy and is really uncertain. She cannot feel her foot so she does not think it was coming from there 3/29; 2-week follow-up. Her wound measures smaller. Surface of the wound appears reasonable. She is using silver alginate with underlying Medihoney. She has home health. X-rays I did of her tib-fib last time were negative although it did show arterial calcification 4/12; 2-week follow-up. Her wound measures smaller in length. Using manuka honey with silver alginate on top. She has home health. 4/26; 2-week follow-up. Her wound is smaller but still very adherent debris under  illumination requiring debridement she has been using manuka honey with silver alginate. 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 Been using Hydrofera Blue. She is on suppressive Bactrim but apparently she has been taking it twice daily 6/7; I have not seen this wound and about 6 weeks. Since then she was up in [place]. By her own admission she was walking on the foot because she did not have a wheelchair. The wound is not nearly as healthy looking as it was the last time I saw this. We ordered different things for her but she only uses Medihoney and silver alginate. As far as I know she is on suppressive trimethoprim sulfamethoxazole. She does not admit to any fever or chills. Her CBGs apparently are at baseline however she is saying that she feels some discomfort on the lateral part of her ankle I looked over her last inflammatory markers from the summer 2020 at which time she had a deeply necrotic infected wound in this area. On 11/10/2018 her sedimentation rate was 56 and C-reactive protein 9.9. This was 107 and 29 on 07/29/2018. 6/17; the patient had a necrotic wound the last time she was here on the right dorsal foot. After debridement I did a culture. This showed a very resistant ESBL Klebsiella as well as Enterococcus. Her x-ray of the foot which was done because of warmth and some discomfort showed bone destruction within the carpal bones involving the navicular acute cuboid lateral middle cuneiforms but essentially unchanged from her prior study which was done on 10/29/2018. The findings were felt to represent chronic osteomyelitis. We did inflammatory markers on her. Her white count was 5.25 sedimentation rate 16 and C-reactive protein at 11.1. Notable for the fact that in August 2020 her CRP was 9.9 and sedimentation rate 56. I have looked at her x-rays. It is true that the bone  destruction is very impressive however the patient came into this clinic for the wound on her right foot with pieces of bone literally falling out anteriorly with purulent material. I am not exactly sure I could have expected anything different. She has not been systemically unwell no fever chills or blood sugars have been reasonable. 6/28; she arrives with a right heel closed. The substantial area on the right anterior foot looks healthy. Much better looking surface. I think we can change to [facility] seems to help this previously. She is getting her antibiotics at dialysis she should be just about finished 7/9; changed to [facility] last week. Surface wound looks satisfactory not much change in surface area however. She is going to [place] state next week this is usually  a difficult thing for this patient follow-up will be for 2 weeks. 7/23; using Hydrofera Blue. She returns from her trip and the wound looks surprisingly good. Usually when this patient goes on trips she comes back with a lot of problems with the wound. She is saying that she sometimes feels an episodic "crunching" feeling on the lateral part of the foot. She is neuropathic and not feeling pain but wonders whether this could be a neuropathic dysesthesia. Objective Constitutional Sitting or standing Blood Pressure is within target range for patient.. Pulse regular and within target range for patient.Marland Kitchen Respirations regular, non-labored and within target range.. Temperature is normal and within the target range for the patient.Marland Kitchen Appears in no distress. Vitals Time Taken: 1:52 PM, Height: 67 in, Weight: 125 lbs, BMI: 19.6, Temperature: 98.4 F, Pulse: 84 bpm, Respiratory Rate: 18 breaths/min, Blood Pressure: 128/74 mmHg. Cardiovascular Pedal pulses in the right foot are palpable was he has now is at the same is no hand-held that he do that. Musculoskeletal I palpated along the lateral part of the right foot which already  has had a transmetatarsal amputation. I did not feel anything particularly abnormal.. General Notes: Wound exam ooRight forefoot. The wound looks clean no debridement is required. She has progressive epithelialization inferiorly and rims of epithelialization. Since the last time I saw this patient I think this is an improvement. Integumentary (Hair, Skin) No erythema around the wound or on the lateral foot. Wound #43 status is Open. Original cause of wound was Gradually Appeared. The wound is located on the Right,Medial Foot. The wound measures 4.2cm length x 3.1cm width x 0.1cm depth; 10.226cm^2 area and 1.023cm^3 volume. There is Fat Layer (Subcutaneous Tissue) Exposed exposed. There is no tunneling or undermining noted. There is a medium amount of serosanguineous drainage noted. The wound margin is flat and intact. There is large (67-100%) red, hyper - granulation within the wound bed. There is a small (1-33%) amount of necrotic tissue within the wound bed including Adherent Slough. 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 Dressing Change Frequency: Wound #43 Right,Medial Foot: Change Dressing every other day. Wound Cleansing: Clean wound with Wound Cleanser - or normal saline Primary Wound Dressing: Wound #43 Right,Medial Foot: Hydrofera Blue - Classic Secondary Dressing: Wound #43 Right,Medial Foot: Kerlix/Rolled Gauze Dry Gauze Edema Control: Elevate legs to the level of the heart or above for 30 minutes daily and/or when sitting, a frequency of: - throughout the day Home Health: [place] [facility] for wound care. - Interim #1 I do not see anything  particularly ominous with the right foot. I am almost scared to x-ray this given the amount of destruction. I will try to ask her about this next time. 2. No evidence of infection 3. Her wound actually looks quite good. We will continue with the [facility] classic Electronic Signature(s) Signed: 10/23/2019 5:20:32 PM By: Linton Ham MD Entered By: Linton Ham on 10/23/2019 14:27:59 -------------------------------------------------------------------------------- SuperBill Details Patient Name: Date of Service: [place] L[name] 10/23/2019 Medical Record Number: [id number] Patient Account Number: [id number] Date of Birth/Sex: Treating RN: July 28, 1970 (49 y.o. [name] Primary Care Provider: [name], [name] Other Clinician: Referring Provider: Treating Provider/Extender: Linton Ham  [place] REIRA, [name] Weeks in Treatment: 59 Diagnosis Coding ICD-10 Codes Code Description [phone] Other chronic osteomyelitis, right ankle and foot L97.514 Non-pressure chronic ulcer of other part of right foot with necrosis of bone L97.411 Non-pressure chronic ulcer of right heel and midfoot limited to breakdown of skin E10.621 Type 1 diabetes mellitus with foot ulcer Facility Procedures CPT4 Code: [id number] Description: 99213 - WOUND CARE VISIT-LEV 3 EST PT Modifier: Quantity: 1 Physician Procedures : CPT4 Code Description Modifier [id number] - WC PHYS LEVEL 3 - EST PT ICD-10 Diagnosis Description M86.671 Other chronic osteomyelitis, right ankle and foot L97.514 Non-pressure chronic ulcer of other part of right foot with necrosis of bone L97.411  Non-pressure chronic ulcer of right heel and midfoot limited to breakdown of skin E10.621 Type 1 diabetes mellitus with foot ulcer Quantity: 1 Electronic Signature(s) Signed: 10/23/2019 5:20:32 PM By: Linton Ham MD Entered By: Linton Ham on 10/23/2019 14:28:20

## 2019-11-04 ENCOUNTER — Other Ambulatory Visit: Payer: Self-pay

## 2019-11-04 ENCOUNTER — Encounter: Payer: Self-pay | Admitting: Endocrinology

## 2019-11-04 ENCOUNTER — Ambulatory Visit (INDEPENDENT_AMBULATORY_CARE_PROVIDER_SITE_OTHER): Payer: Medicare HMO | Admitting: Endocrinology

## 2019-11-04 VITALS — BP 110/72 | HR 78 | Ht 67.5 in | Wt 127.4 lb

## 2019-11-04 DIAGNOSIS — N186 End stage renal disease: Secondary | ICD-10-CM | POA: Diagnosis not present

## 2019-11-04 DIAGNOSIS — Z992 Dependence on renal dialysis: Secondary | ICD-10-CM

## 2019-11-04 DIAGNOSIS — E1065 Type 1 diabetes mellitus with hyperglycemia: Secondary | ICD-10-CM

## 2019-11-04 DIAGNOSIS — E7800 Pure hypercholesterolemia, unspecified: Secondary | ICD-10-CM

## 2019-11-04 NOTE — Progress Notes (Signed)
Patient ID: [name], female   DOB: 10/26/1970, 49 y.o.   MRN: [id number]   Reason for Appointment: follow-up of diabetes  History of Present Illness   Diagnosis: Type 1 DIABETES MELITUS  Previous history: She has had long-standing poorly controlled diabetes and typically has poor compliance with self care measures despite periodic diabetes education and periodic followup  INSULIN PUMP regimen:   Current insulin pump: T-insulin as of 07/20/2019   Basal rates: 0.3 until 3 PM and then 0.35   all 24 hours  Previous settings: Midnight to 6 AM = 0.6, 3 AM = 0.5, 6 AM-9 AM: 0.55, 9 AM-1 PM = 0. 3, 1 PM-3 PM = 0.3, 3 PM- 7 PM = 0.75, 7 PM-12 AM = 0.85   Carb coverage 1: 20 g and correction 1: 70 Active insulin 4 hours CGM alerts: 70, 300         Recent history:   Her recent A1c was 8.4 done at the dialysis center, previously 11.7  Current blood sugar patterns, difficulties with management and problems identified:   She has been again not bolusing for her meals most of the time unless blood sugar is significantly high  Recently HIGHEST blood sugars are in the afternoon and she is not bolusing for her meals  Also when she is coming back from dialysis she has a protein drink with 43 g carbohydrate but she says she is too tired to bolus for this  Also occasionally has significantly high readings in the afternoon and also sometimes overnight and only occasionally will make corrections  Her daily average food boluses only 0.7 units  She again insists that she is getting frequent hypoglycemia although only has had low normal readings rarely at midday, 2:30 AM and 11 PM that were transient  She said that when she is having low blood sugar she will disconnect her pump and if she is asleep she will not reconnect until she wakes up, subsequently her blood sugars are over 400  She was told to use the exercise modality when her blood sugars are low normal but she was  confused about this and is using the sleep modality every now and then she will  Not able to get data on how often she is changing her infusion set    CONTINUOUS GLUCOSE MONITORING RECORD INTERPRETATION    Dates of Recording: 7/28 through 8/3  Sensor description: Dexcom  Results statistics:   CGM use % of time   Average and SD  217+/-99  Time in range      40%  % Time Above 180  58  % Time above 250   % Time Below target 2    PRE-MEAL  overnight  morning  afternoon  evening Overall  Glucose range:      58-400  Mean/median:  199  176  272  209  217+/-99    Glycemic patterns summary: Blood sugars are showing marked fluctuation especially in the afternoons is also around 2 AM blood sugars are above the target throughout the day except between about 8 AM-10 AM Overall HIGHEST blood sugars are between 12 PM and 5 PM No significant hypoglycemia  Hyperglycemic episodes are occurring mostly in the afternoons but also sporadically at all other times including overnight  Hypoglycemic episodes have been minimal with mostly low normal readings as above  Overnight periods: Blood sugars are extremely variable and averaging mostly between 180-210 without significant hypoglycemia  Preprandial periods: She has no  psych mealtimes and bolus information is frequently missing  Postprandial periods:   Very few bolus events documented for meals, last evening blood sugar did go up significantly higher after the bolus but previously did come back to normal with high readings   Previous data:  PRE-MEAL  overnight  morning  afternoon  evening Overall  Glucose range:      82-400  Mean/median:  279  158  217  248  238+/-97      Meals: eating at variable times and also having snacks periodically.  Her daily carbohydrate intake is quite variable Physical activity: exercise: none         Dietician visit: Most recent:?          Complications: are: Neuropathy, nephropathy, diabetic foot ulcer,  amputation    Wt Readings from Last 3 Encounters:  11/04/19 127 lb 6.8 oz (57.8 kg)  09/01/19 129 lb 14.4 oz (58.9 kg)  08/06/19 124 lb 3.2 oz (56.3 kg)   Lab Results  Component Value Date   HGBA1C 8.4 10/22/2019   HGBA1C 11.7 (A) 07/21/2019   HGBA1C 9.3 (A) 02/04/2019   Lab Results  Component Value Date   MICROALBUR 304.2 (H) 11/11/2017   LDLCALC 78 05/02/2016   CREATININE 3.70 (H) 04/29/2019   Office Visit on 11/04/2019  Component Date Value Ref Range Status  . Calcium 10/22/2019 7.8* 8.7 - 10.7 Final  . Hemoglobin A1C 10/22/2019 8.4   Final    Other problems addressed:: See review of systems   Allergies as of 11/04/2019      Reactions   Cleocin [clindamycin Hcl] Diarrhea   Lisinopril Other (See Comments)   Elevated potassium per pt report   Amoxicillin Diarrhea, Nausea Only, Other (See Comments)   Did it involve swelling of the face/tongue/throat, SOB, or low BP? No Did it involve sudden or severe rash/hives, skin peeling, or any reaction on the inside of your mouth or nose? No Did you need to seek medical attention at a hospital or doctor's office? No When did it last happen?10 + years If all above answers are "NO", may proceed with cephalosporin use.   Bactrim [sulfamethoxazole-trimethoprim] Diarrhea, Nausea Only      Medication List       Accurate as of November 04, 2019  3:18 PM. If you have any questions, ask your nurse or doctor.        albuterol 108 (90 Base) MCG/ACT inhaler Commonly known as: VENTOLIN HFA Inhale 2 puffs into the lungs every 6 (six) hours as needed for wheezing or shortness of breath.   aspirin EC 81 MG tablet Take 81 mg by mouth daily.   B-12 5000 MCG Caps Take 5,000 mcg by mouth daily.   calcitRIOL 0.5 MCG capsule Commonly known as: ROCALTROL Take 1 capsule daily   calcium acetate 667 MG capsule Commonly known as: PHOSLO Take 2 capsules (1,334 mg total) by mouth 3 (three) times daily with meals.   Contour Next Test test  strip Generic drug: glucose blood USE AS INSTRUCTED TO CHECK BLOOD SUGAR 4 TIMES A DAY. DX E10.65   Dexcom G6 Receiver Devi 1 each by Does not apply route See admin instructions. Use Dexcom Receiver to monitor blood sugar continuously.   Dexcom G6 Sensor Misc 1 each by Does not apply route See admin instructions. Use one sensors once every 10 days to monitor blood sugars.   Dexcom G6 Transmitter Misc 1 each by Does not apply route See admin instructions. Use one transmitter  once every 90 days.   DIALYVITE 800 WITH ZINC 0.8 MG Tabs Take 1 tablet by mouth daily.   diphenhydrAMINE 25 MG tablet Commonly known as: BENADRYL Take 25 mg by mouth daily as needed.   folic acid 517 MCG tablet Commonly known as: FOLVITE Take 400 mcg by mouth daily.   insulin aspart 100 UNIT/ML injection Commonly known as: NovoLOG USE A MAX OF 65 UNITS DAILY VIA INSULIN PUMP.   insulin pump Soln Inject into the skin. Insulin aspart (Novolog) 100 unit/ml---MAX 65 UNITS DAILY   norethindrone 5 MG tablet Commonly known as: AYGESTIN Take 5 mg by mouth See admin instructions.   Omega 3 500 500 MG Caps Take 500 mg by mouth daily.   ondansetron 4 MG tablet Commonly known as: ZOFRAN Take 4 mg by mouth every 8 (eight) hours as needed for nausea or vomiting.   pravastatin 40 MG tablet Commonly known as: PRAVACHOL TAKE 1 TABLET BY MOUTH EVERY DAY   sulfamethoxazole-trimethoprim 800-160 MG tablet Commonly known as: BACTRIM DS Take 1 tablet by mouth daily.       Allergies:  Allergies  Allergen Reactions  . Cleocin [Clindamycin Hcl] Diarrhea  . Lisinopril Other (See Comments)    Elevated potassium per pt report  . Amoxicillin Diarrhea, Nausea Only and Other (See Comments)    Did it involve swelling of the face/tongue/throat, SOB, or low BP? No Did it involve sudden or severe rash/hives, skin peeling, or any reaction on the inside of your mouth or nose? No Did you need to seek medical attention at a  hospital or doctor's office? No When did it last happen?10 + years If all above answers are "NO", may proceed with cephalosporin use.    . Bactrim [Sulfamethoxazole-Trimethoprim] Diarrhea and Nausea Only    Past Medical History:  Diagnosis Date  . Anemia   . Arthritis   . Bronchitis   . C. difficile diarrhea 09/26/2014  . Cataracts, both eyes    had surgery to remove  . Cellulitis of right foot 06/02/2014  . CKD (chronic kidney disease) stage 3, GFR 30-59 ml/min    Dialysis T, TH, Sat  . Diabetic ulcer of right foot ([place]) 06/02/2014  . DVT (deep venous thrombosis) ([place]) 10/2014   one in each one in leg- PICC line , one in right and left arm  . H/O seasonal allergies   . Heart murmur    told once when she was pregnant- no furter mention- was in notes 11/2015- had Echo 8 /4/17  . History of blood transfusion   . Hypercholesteremia   . Hypertension   . Left foot infection   . Neuromuscular disorder ([place])    lower legs and feet  . Neuropathy    feet  . Neuropathy in diabetes ([place])   . Peripheral vascular disease ([place])   . Sleep apnea    does not use cpap  . Staphylococcus aureus bacteremia 10/2014  . Type 1 diabetes ([place])    onset age 9    Past Surgical History:  Procedure Laterality Date  . AMPUTATION TOE Left 07/24/2015   5th toe and Hallux amputation  . AV FISTULA PLACEMENT Left 08/06/2018   Procedure: INSERTION OF BASCILIC VEN TRANSPOSITION 1ST STAGE;  Surgeon: Rosetta Posner, MD;  Location: [facility];  Service: Vascular;  Laterality: Left;  . BASCILIC VEIN TRANSPOSITION Left 04/29/2019   Procedure: SECOND STAGE BASCILIC VEIN TRANSPOSITION LEFT ARM;  Surgeon: Rosetta Posner, MD;  Location: [facility];  Service:  Vascular;  Laterality: Left;  . CESAREAN SECTION     x 1  . EXOSTECTECTOMY TOE Left 04/11/2016   Procedure: EXOSTECTECTOMY CUBOID AND 5TH METATARSAL AND PLACEMENT OF ANTIBIOTIC BEADS;  Surgeon: Edrick Kins, DPM;  Location: [facility];  Service: Podiatry;  Laterality: Left;    . EYE SURGERY Bilateral    removed cataracts - laser  . FEMORAL-POPLITEAL BYPASS GRAFT Left 05/03/2015  . FOOT AMPUTATION THROUGH METATARSAL Right 2016  . hemrrhoidectomy    . I & D EXTREMITY Right 06/10/2014   Procedure: IRRIGATION AND DEBRIDEMENT Right Foot;  Surgeon: Newt Minion, MD;  Location: [facility];  Service: Orthopedics;  Laterality: Right;  . I & D EXTREMITY Right 07/31/2018   Procedure: IRRIGATION AND DEBRIDEMENT EXTREMITY;  Surgeon: Edrick Kins, DPM;  Location: [facility];  Service: Podiatry;  Laterality: Right;  . I & D EXTREMITY Right 08/04/2018   Procedure: IRRIGATION AND DEBRIDEMENT FOOT with placement of wound vac;  Surgeon: Edrick Kins, DPM;  Location: [facility];  Service: Podiatry;  Laterality: Right;  . INCISION AND DRAINAGE Left 04/11/2016   Procedure: INCISION AND DRAINAGE A DEEP COMPLICATED WOUND OF LEFT FOOT;  Surgeon: Edrick Kins, DPM;  Location: [facility];  Service: Podiatry;  Laterality: Left;  . INSERTION OF DIALYSIS CATHETER Right 08/06/2018   Procedure: INSERTION OF TUNNELED DIALYSIS CATHETER;  Surgeon: Rosetta Posner, MD;  Location: [facility];  Service: Vascular;  Laterality: Right;  . IR FLUORO GUIDE CV LINE RIGHT  09/24/2017  . IR FLUORO GUIDE CV LINE RIGHT  08/01/2018  . IR REMOVAL TUN CV CATH W/O FL  11/22/2017  . IR US GUIDE VASC ACCESS RIGHT  09/24/2017  . IR US GUIDE VASC ACCESS RIGHT  08/01/2018  . IRRIGATION AND DEBRIDEMENT ABSCESS Left 05/06/2017   Procedure: IRRIGATION AND DEBRIDEMENT ABSCESS LEFT FOOT;  Surgeon: Edrick Kins, DPM;  Location: [facility];  Service: Podiatry;  Laterality: Left;  . PERIPHERALLY INSERTED CENTRAL CATHETER INSERTION    . SKIN GRAFT Right 06/15/2014  . SKIN GRAFT Left 06/2015   foot   . SKIN SPLIT GRAFT Right 06/15/2014   Procedure: SPLIT THICKNESS SKIN GRAFT RIGHT FOOT;  Surgeon: Newt Minion, MD;  Location: [facility];  Service: Orthopedics;  Laterality: Right;  . TRANSMETATARSAL AMPUTATION Right   . TRANSMETATARSAL AMPUTATION Right 09/11/2014  .  WISDOM TOOTH EXTRACTION      Family History  Problem Relation Age of Onset  . Hyperlipidemia Mother   . Dementia Mother     Social History:  reports that she quit smoking about 11 years ago. She quit after 15.00 years of use. She has never used smokeless tobacco. She reports previous alcohol use. She reports that she does not use drugs.  Review of Systems:   Hypertension/CKD:   Is on dialysis  Currently not on any blood pressure medications  BP Readings from Last 3 Encounters:  11/04/19 110/72  09/01/19 110/62  08/06/19 110/68    Lipids: Has been  treated with pravastatin 40 mg  Followed by PCP   Lab Results  Component Value Date   CHOL 152 06/12/2018   HDL 24.40 (L) 06/12/2018   LDLCALC 78 05/02/2016   LDLDIRECT 82.0 06/12/2018   TRIG 222.0 (H) 06/12/2018   CHOLHDL 6 06/12/2018      Chronic anemia, followed by nephrologist and PCP:  Lab Results  Component Value Date   WBC 6.1 11/10/2018   HGB 11.2 (L) 04/29/2019   HCT 33.0 (L)  04/29/2019   MCV 73.9 (L) 11/10/2018   PLT 370 11/10/2018    She has a Charcot foot, 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      ASSESSMENT/ PLAN:   Diabetes type 1 with poor control and multiple complications      See history of present illness for detailed discussion of current diabetes management, blood sugar patterns and problems identified  A1c is relatively better at 8.4 done last month at dialysis  She has continued the closed-loop insulin pump with the T-Slim device and Dexcom  CGM results and blood sugar abnormalities were discussed with the patient Discussed causes for hypoglycemia Explained that her hypoglycemia has been infrequent and usually aborted by auto basal Some of her  significant high readings are from her taking off the pump Currently not getting any tendency to low sugars during dialysis as before Also not understanding that if blood sugars are trending lower she needs to put it in exercise mode which she does not do so  Hyperlipidemia needs follow-up labs  PLAN:     Reminded her to bolus consistently for all meals and snacks regardless of blood sugars  Explained to her that she is not getting hypoglycemia and when her blood sugars are low normal her basal rate is already suspended and she does not need to remove the tubing, however she refused to believe this  She does need to keep her pump on all the time and at the most suspend for 30 to 60 minutes  Consider increasing basal rate in the afternoon if blood sugars are more consistently higher, however she refuses to consider increasing basal rate  Needs to do correction boluses for high readings more often  Patient Instructions  Bolus for all carbs including Nepro drink  Exercise mode if sugar under 120 for 2 hours  Avoid suspending         Elayne Snare 11/04/2019, 3:18 PM      Elayne Snare 11/04/19

## 2019-11-04 NOTE — Patient Instructions (Signed)
Bolus for all carbs including Nepro drink  Exercise mode if sugar under 120 for 2 hours  Avoid suspending

## 2019-11-13 ENCOUNTER — Encounter (HOSPITAL_BASED_OUTPATIENT_CLINIC_OR_DEPARTMENT_OTHER): Payer: Medicare HMO | Attending: Internal Medicine | Admitting: Internal Medicine

## 2019-11-13 DIAGNOSIS — Z87891 Personal history of nicotine dependence: Secondary | ICD-10-CM | POA: Insufficient documentation

## 2019-11-13 DIAGNOSIS — Z86718 Personal history of other venous thrombosis and embolism: Secondary | ICD-10-CM | POA: Insufficient documentation

## 2019-11-13 DIAGNOSIS — M86671 Other chronic osteomyelitis, right ankle and foot: Secondary | ICD-10-CM | POA: Diagnosis not present

## 2019-11-13 DIAGNOSIS — Z89512 Acquired absence of left leg below knee: Secondary | ICD-10-CM | POA: Insufficient documentation

## 2019-11-13 DIAGNOSIS — Z9641 Presence of insulin pump (external) (internal): Secondary | ICD-10-CM | POA: Insufficient documentation

## 2019-11-13 DIAGNOSIS — Z8614 Personal history of Methicillin resistant Staphylococcus aureus infection: Secondary | ICD-10-CM | POA: Diagnosis not present

## 2019-11-13 DIAGNOSIS — L97514 Non-pressure chronic ulcer of other part of right foot with necrosis of bone: Secondary | ICD-10-CM | POA: Diagnosis not present

## 2019-11-13 DIAGNOSIS — N189 Chronic kidney disease, unspecified: Secondary | ICD-10-CM | POA: Diagnosis not present

## 2019-11-13 DIAGNOSIS — M8580 Other specified disorders of bone density and structure, unspecified site: Secondary | ICD-10-CM | POA: Diagnosis not present

## 2019-11-13 DIAGNOSIS — I129 Hypertensive chronic kidney disease with stage 1 through stage 4 chronic kidney disease, or unspecified chronic kidney disease: Secondary | ICD-10-CM | POA: Diagnosis not present

## 2019-11-13 DIAGNOSIS — L97529 Non-pressure chronic ulcer of other part of left foot with unspecified severity: Secondary | ICD-10-CM | POA: Diagnosis not present

## 2019-11-13 DIAGNOSIS — L97411 Non-pressure chronic ulcer of right heel and midfoot limited to breakdown of skin: Secondary | ICD-10-CM | POA: Diagnosis not present

## 2019-11-13 DIAGNOSIS — M1990 Unspecified osteoarthritis, unspecified site: Secondary | ICD-10-CM | POA: Insufficient documentation

## 2019-11-13 DIAGNOSIS — E10621 Type 1 diabetes mellitus with foot ulcer: Secondary | ICD-10-CM | POA: Insufficient documentation

## 2019-11-13 DIAGNOSIS — E1022 Type 1 diabetes mellitus with diabetic chronic kidney disease: Secondary | ICD-10-CM | POA: Insufficient documentation

## 2019-11-13 DIAGNOSIS — L02415 Cutaneous abscess of right lower limb: Secondary | ICD-10-CM | POA: Insufficient documentation

## 2019-11-13 DIAGNOSIS — G40909 Epilepsy, unspecified, not intractable, without status epilepticus: Secondary | ICD-10-CM | POA: Insufficient documentation

## 2019-11-13 DIAGNOSIS — E1069 Type 1 diabetes mellitus with other specified complication: Secondary | ICD-10-CM | POA: Insufficient documentation

## 2019-11-13 DIAGNOSIS — E1042 Type 1 diabetes mellitus with diabetic polyneuropathy: Secondary | ICD-10-CM | POA: Insufficient documentation

## 2019-11-13 NOTE — Progress Notes (Signed)
HALA, [name] ([id number]) Visit Report for 11/13/2019 HPI Details Patient Name: Date of Service: [name] 11/13/2019 1:15 PM Medical Record Number: [id number] Patient Account Number: [id number] Date of Birth/Sex: Treating RN: January 09, 1971 (49 y.o. [name] Primary Care Provider: [name], [name] Other Clinician: Referring Provider: Treating Provider/Extender: Tobi Bastos MO REIRA, [name] Weeks in Treatment: 18 History of Present Illness HPI Description: 49 year old diabetic who is known to have type 1 diabetes which is poorly controlled last hemoglobin A1c was 11%. She comes in with a ulcerated area on the left lateral foot which has been there for over 6 months. Was recently she has been treated by [name] of podiatry who saw her last on 05/28/2016. Review of his notes revealed that the patient had incision and drainage with placement of antibiotic beads to the left foot on 04/11/2016 for possible osteomyelitis of the cuboid bone. Over the last year she's had a history of amputation of the left fifth toe and a femoropopliteal popliteal bypass graft somewhere in April 2017. 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 His note [name] mentions that the patient has been referred to me for further wound care and possibly great candidate for hyperbaric oxygen therapy due to recurrent osteomyelitis. However we do not have any x-rays of biopsy reports confirming this. He has been on several antibiotics including Bactrim and most recently is on doxycycline for an MRSA. I understand, the patient was not a candidate for IV antibiotics as she has had previous PICC lines which resulted in blood clots in both arms. There was a x-ray report dated 04/04/2016 on [name] notes which showed evidence of fifth ray resection left foot with osteolytic changes noted to the fourth metatarsal and cuboid bone on the left. 06/13/2016 -- had a left foot x-ray which  showed no acute fracture or dislocation and no definite radiographic evidence of osteomyelitis. Advanced osteopenia was seen. 06/20/2016 -- she has noticed a new wound on the right plantar foot in the region where she had a callus before. 06/27/16- the patient did have her x-ray of the right foot which showed no findings to suggest osteomyelitis. She saw her endocrinologist, [name], yesterday. Her A1c in January was 11. He also indicates mismanagement and noncompliance regarding her diabetes. She is currently on Bactrim for a lip infection. She is complaining of nausea, vomiting and diarrhea. She is unable to articulate the exact orders or dosing of the Bactrim; it is unclear when she will complete this. 07/04/2016 -- results from [facility] of ABIs with ankle waveforms were noted from 02/14/2016. The examination done on 06/27/2015 showed noncompressible ABIs with the right being 1.45 and the left being 1.33. The present examination showed a right ABI of 1.19 on the left of 1.33. The conclusion was that right normal ABI in the lower extremity at rest however compared to previous study which was noncompressible ABI may be falsely elevated side suggesting medial calcification. The left ABI suggested medial calcification. 08/01/2016 -- the patient had more redness and pain on her right foot and did not get to come to see as noted she see her PCP or go to the ER and decided to take some leftover metronidazole which she had at home. As usual, the patient does report she feels and is rather noncompliant. 08/08/2016 -- -- x-ray of the right foot -- FINDINGS:Transmetatarsal amputation is noted. No bony destruction is noted to suggest osteomyelitis. IMPRESSION: No evidence of  osteomyelitis. Postsurgical changes are seen. MRI would be more sensitive for possible bony changes. Culture has grown Serratia Marcescens -- sensitive to Bactrim, ciprofloxacin, ceftazidime she was seen by [name] on  08/06/2016. He did not find any exposed bone, muscle, tendon, ligament or joint. There was no malodor and he did a excisional debridement in the office. ============ Old notes: 49 year old patient who is known to the wound clinic for a while had been away from the wound clinic since 09/01/2014. Over the last several months she has been admitted to various hospitals including [facility] [place]. She was treated for a right metatarsal osteomyelitis with a transmetatarsal amputation and this was done about 2 months ago. He has a small ulcerated area on the right heel and she continues to have an ulcerated area on the left plantar aspect of the foot. The patient was recently admitted to the [facility] between 7/12 and 10/18/2014. she was given 3 weeks of IV vancomycin and was to follow-up with her surgeons at [facility] and also took oral vancomycin for C. difficile colitis. 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 MRI of the left foot done on 10/14/2014 showed no abscess or osteomyelitis. 04/27/15; this is a patient we know from previous stays in the wound care center. She is a type I diabetic I am not sure of her control currently. Since the last time I saw her she is had a right transmetatarsal amputation and has no wounds on her right foot and has no open wounds. She is been followed at the wound care center at [facility] in [place]. She comes today with the desire to undergo hyperbaric treatment locally. Apparently one of her wound care providers in [place] has suggested hyperbarics. This is in response to an MRI from 04/18/15 that showed increased marrow signal and loss of the proximal  fifth metatarsal cortex evidence of osteomyelitis with likely early osteomyelitis in the cuboid bone as well. She has a large wound over the base of the fifth metatarsal. She also has a eschar over her the tips of her toes on 1,3 and 5. She does not have peripheral pulses and apparently is going for an angiogram tomorrow which seems reasonable. After this she is going to infectious disease at [facility]. They have been using Medihoney to the large wound on the lateral aspect of the left foot to. The patient has known Charcot deformity from diabetic neuropathy. She also has known diabetic PAD. Surprisingly I can't see that she has had any recent antibiotics, the patient states the last antibiotic she had was at the end of November for 10 days. I think this was in response to culture that showed group G strep although I'm not exactly sure where the culture was from. She is also had arterial studies on 03/29/15. This showed a right ABI of 1.4 that was noncompressible. Her left ABI was 0.73. 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 It was not felt that arterial inflow was adequate for healing of a foot ulcer. Her Doppler waveforms looked monophasic ===== READMISSION 02/28/17; this is in an now 49 year old woman we've had at several different occasions in this clinic. 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 She has  a remote ex-smoker. She was last seen in this clinic by Dr. Con Memos I think in May. More recently she is been followed by her podiatrist [name] an infectious disease Dr. Megan Salon. She has 2 open wounds the major one is over the right first metatarsal head she also has a wound on the left plantar foot. an MRI of the right foot on 01/01/17 showed a soft tissue ulcer along the plantar aspect of the first metatarsal base consistent with osteomyelitis of the first metatarsal stump. Dr. 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 According to the patient this is been open for slightly over a month. She has been on a combination of Cipro 500 twice a day, Zyvox 600 twice a day and Flagyl 500 3 times a day for over a month now as directed by Dr. Megan Salon. cultures of the right foot earlier this year showed MRSA in January and Serratia in May. January also had a few viridans strep. Recent x-rays of both feet were done and [name] office and I don't have these reports. The patient has known PAD and has a history of aleft femoropopliteal bypass in April 2017. 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 Dwyane Dee. In spite of this the last hemoglobin A1c I can see is 10.1 on 01/01/2017. She is being referred by 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 She is been using Medihoney to this area and also an area on the left midfoot. She is using healing sandals bilaterally. ABIs in this clinic at the left posterior tibial was 1.1 noncompressible on the right READMISSION Non invasive vascular NOVANT 5/18 Aftercare following surgery of the circulatory system Procedure Note - Interface, External Ris In - 08/13/2016 11:05 AM EDT Procedure: Examination consists of physiologic resting arterial pressures of the brachial and ankle arteries bilaterally with continuous wave Doppler waveform analysis. Previous: Previous exam performed on 02/14/16 demonstrated ABIs of Rt = 1.19 and Lt = 1.33. Right: ABI = non-compressible PT 1.47 DP. S/P transmet amputation. , Left: ABI = 1.52, 2nd digit pressure = 87 mmHg Conclusions: Right: ABI (>1.3) may be falsely elevated, suggesting medial calcification. Left: ABI (>1.3) may be falsely  elevated, suggesting medial calcification The patient is a now 49 year old type I diabetic is had multiple issues her graded to chronic diabetic foot ulcers. 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 We had her in the clinic lastin November. At that point she had wounds on her bilateral feet.she had wanted to try hyperbarics however the healogics review process denied her because she hadn't followed up with her vascular surgeon for her left femoropopliteal bypass. The bypass was done by Dr. Raul Del at [facility]. We made her a follow-up with Dr. Raul Del however she did not keep the appointment and therefore she was not approved The patient shows me a small wound on her left fourth metatarsal head on her phone. She developed rapid discoloration in the plantar aspect of the left foot and she was admitted to hospital from 2/2 through 05/10/17 with wet gangrene of the left foot osteomyelitis of the fourth metatarsal heads. She was admitted acutely ill with a temperature of 103.  She was started on broad-spectrum vancomycin and cefepime. On 05/06/17 she was taken to the OR by [name] her podiatric surgeon for an incision and drainage irrigation of the left foot wound. Cultures from this surgery revealed group be strep and anaerobes. she was seen by Dr.Xu of orthopedic surgery and scheduled for a below-knee amputation which she u refused. Ultimately she was discharged on Levaquin and Flagyl for one month. MRI 05/05/17 done while she was in the hospital showed abscess adjacent to the fourth metatarsal head and neck small abscess around the fourth flexor tendon. Inflammatory phlegmon and gas in the soft tissues along the lateral aspect of the fourth phalanx. Findings worrisome for osteomyelitis involving the fourth proximal and middle phalanx and also the third and fourth metatarsals. Finally the patient had actually shortly  before this followed up with Dr. Raul Del at no time on 04/29/17. He felt that her left femoropopliteal bypass was patent he felt that her left-sided toe pressures more than adequate for healing a wound on the left foot. This was before her acute presentation. 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 05/28/17; she is started hyperbarics. The patient tells me that for some reason she was not actually on Levaquin but I think on ciprofloxacin. She was on Flagyl. She only started her Levaquin yesterday due to some difficulty with the pharmacy and perhaps her sister picking it up. She has an appointment with [name] tomorrow and with infectious disease early next week. She has no new complaints 06/06/17; the patient continues in hyperbarics. She saw [name] on 05/29/17 who is her podiatric surgeon. He is elected for a transmetatarsal amputation on 06/27/17. I'm not sure at what level he plans to do this amputation. The patient is unaware She also saw Dr. Megan Salon of infectious disease who elected to continue her on current antibiotics I think this is ciprofloxacin and Flagyl. I'[name] need to clarify with her tomorrow if she actually has this. We're using silver alginate to the actual wound. Necrotic surface today with material under the flap of her foot. Original MRI showed abscesses as well as osteomyelitis of the proximal and middle fourth phalanx and the third and fourth metatarsal heads 06/11/17; patient continues in hyperbarics and continues on oral antibiotics. She is doing well. The wound looks better. The necrotic part of this under the flap in her superior foot also looks better. she is been to see [name]. I haven't had a chance to look at his note. Apparently he has put the transmetatarsal amputation on hold her request it is still planning to take her to the OR for debridement and product application ACEL. I'[name] see if I can find his note. I'[name] therefore leave product ordering/requests to [name]  for now. I was going to look at Dermagraft 06/18/17-she is here in follow-up evaluation for bilateral foot wounds. She continues with hyperbaric therapy. She states she has been applying manuka honey to the right plantar foot and alternate manuka honey and silver alginate to the left foot, despite our orders. We will continue with same treatment plan and she will follo up next week. 06/25/17; I have reviewed [name] last note from 3/11. She has operative debridement in 2 days' time. By review his note apparently they're going to place there is skin over the majority of this wound which is a good choice. She has a small satellite area at the most proximal part of this wound on the left plantar foot. The area on the right  plantar foot we've been using silver alginate and it is close to healing. 07/02/17; unfortunately the patient was not easily approved for [name] proposed surgery. I'm not completely certain what the issue is. She has been using silver alginate to the wound she has completed a first course of hyperbarics. She is still on Levaquin and Flagyl. I have really lost track of the time course here.I suspect she should have another week to 2 of antibiotics. I'[name] need to see if she is followed up with infectious disease Dr. Megan Salon 07/09/17; the patient is followed up with Dr. Megan Salon. 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 She continues on Levaquin and metronidazole continuing both of these for now I think she is been on fr about 6 weeks. She still has some drainage but no pain. No fever. Her had been plans for her to go to the OR for operative debridement with her podiatrist [name], I am not exactly sure where that is. I'[name] probably slip a note to [name] today. I note that she follows with Dr. Dwyane Dee of endocrinology. We have her recertified for hyperbaric oxygen. I have not heard about Dermagraft however I'[name] see if [name] is planning a  skin substitute as well 07/16/17; the patient tells me she is just about out of [place]. I'[name] need to check Dr. Hale Bogus last notes on this. She states she has plenty of Flagyl however. She comes in today complaining of pain in the right lateral foot which she said lasted for about a day. The wound on the right foot is actually much more medially. She also tells me that the [facility] cost a lot of pain in the left foot wound and she turned back to silver alginate. Finally Dermagraft has a $481 per application co-pay. She cannot afford this 07/23/17; patient arrives today with the wound not much smaller. There is not much new to add. She has not heard from [name] all try to put in a call to them today. She was asking about Dermagraft again and she has an over $856 per application co-pay she states that she would be willing to try to do a payment plan. I been tried to avoid this. We've been using silver alginate, I'[name] change to [facility] 07/30/17-She is here in follow-up evaluation for left foot ulcer. She continues hyperbaric medicine. The left foot ulcer is stable we will continue with same treatment plan 08/06/17; she is here for evaluation of her left foot ulcer. Currently being treated for hyperbarics or underlying osteomyelitis. She is completed antibiotics. The left foot ulcer is better smaller with healthier looking granulation. For various reasons I am not really clear on we never got her back to the OR with [name]. He did not respond to my secure text message. Nevertheless I think that surgery on this point is not necessary nor am I completely clear that a skin substitute is necessary The patient is complaining about pain on the outside of her right foot. 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 There is no erythema. She also states the foot is warm versus her other part of her upper leg and this is largely true. It is not totally clear to me what's causing this. She  thinks it's different from her usual neuropathy pain 08/13/17; she arrives in clinic today with a small wound which is superficial on her right first metatarsal head. 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 She tells [place] she was up on her feet over the Mother's Day celebration. The large wound is on the left foot. Continues with hyperbarics for underlying osteomyelitis. We're using Hydrofera Blue. She asked me today about where we were with Dermagraft. I had actually excluded this because of the co-pay however she wants to assume this therefore I'[name] recheck the co-pay an order for next week. 08/20/17; the patient agreed to accept the co-pay of the first Apligraf which we applied today. She is disappointed she is finishing hyperbarics will run this through the insurance on the extent of the foot infection and the extent of the wound that she had however she is already had 60 dive's. Dermagraft No. 1 08/27/17; Dermagraft No. 2. She is not eligible for any more hyperbaric treatments this month. She reports a fair amount of drainage and she actually changed to the external dressings without disturbing the direct contact layer 09/03/17; the patient arrived in clinic today with the wound superficially looking quite healthy. Nice vibrant red tissue with some advancing epithelialization although not as much adherence of the flap as I might like. However she noted on her own fourth toe some bogginess and she brought that to our attention. Indeed this was boggy feeling like a possibility of subcutaneous fluid. 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 She is not been unwell. We've been using Dermagraft 09/10/17; the culture that I did not last week was MRSA. She saw Dr. Megan Salon this morning who is going to start her on vancomycin. I had sent him a secure a text message yesterday. I also spoke with her podiatric surgeon  [name] about surgery on this foot the options for conserving a functional foot etc. Promised me he would see her and will make back consultation today. Paradoxically her actual wound on the plantar aspect of her left foot looks really quite good. I had given her 5 days worth of Baxdella to cover her for MRSA. Her MRI came back showing osteomyelitis within the third metatarsal shaft and head and base of the third and fourth proximal phalanx. She had extensive inflammatory changes throughout the soft tissue of the lateral forefoot. With an ill-defined fluid around the fourth metatarsal extending into the plantar and dorsal soft tissues 09/19/17; the patient is actually on oral Septra and Flagyl. She apparently refused IV vancomycin. She also saw [name] at my request who is planning her for a left BKA sometime in mid July. MRI showed osteomyelitis within the third metatarsal shaft and head and the basis of the third and fourth proximal phalanx. I believe there was felt to be possible septic arthritis involving the third MTP. 09/26/17; the patient went back to Dr. Megan Salon at my suggestion and is now receiving IV daptomycin. Her wound continues to look quite good making the decision to proceed with a transmetatarsal amputation although more difficult for the patient. I believe in my extensive discussions with her she has a good sense of the pros and cons of this. I don't NV the tuft decision she has to make. She has an appointment with [name] I believe in mid July and I previously spoken to him about this issue Has we had used 3 previous Dermagraft. Given the condition of the wound surface I went ahead and added the fourth one today area and I did this not fully realizing that she'[name] be traveling  to [place] next week. I'm hopeful she can come back in 2 weeks 10/21/17; Her same Dermagraft on for about 3-1/2 weeks. In spite of this the wound arrives looking quite healthy. There is been a lot of  healing dimensions are smaller. Looking at the square shaped wound she has now there is some undermining and some depth medially under the undermining although I cannot palpate any bone. No surrounding infection is obvious. She has difficult questions about how to look at this going forward vis--vis amputations versus continued medical therapy. T be truthful the wound is looks o so healthy and it is continued to contract. Hard to justify foot surgery at this point although I still told her that I think it might come to that if we are not able to eradicate the underlying MRSA. She is still highly at risk and she understands this 11/06/17 on evaluation today patient appears to be doing better in regard to her foot ulcer. She's been tolerating the dressing changes without complication. Currently she is here for her Dermagraft #6. Her wound continues to make excellent progress at this point. She does not appear to have any evidence of infection which is good news. 11/13/17 on evaluation today patient appears to be doing excellent at this time. She is here for repeat Dermagraft application. This is #7. Overall her wound seems to be making great progress. 12/05/17; the patient arrives with the wound in much better condition than when I last saw this almost 6 weeks ago. She still has a small probing area in the left metatarsal head region on the lateral aspect of her foot. We applied her last Dermagraft today. Since the last time she is here she has what appears to a been a blood blister on the plantar aspect of left foot although I don't see this is threatening. There is also a thick raised tissue on the right mid metatarsal head region. This was not there I don't think the last time she was here 3 weeks ago. 12/12/17; the patient continues to have a small programming area in the left metatarsal head region on the lateral aspect of her foot which was the initial large surgical wound. I applied her last Apligraf  last week. I'm going to use Endoform starting today Unfortunately she has an excoriated area in the left mid foot and the right mid foot. The left midfoot looks like a blistered area this was not opened last week it certainly is open today. Using silver alginate on these areas. She promises me she is offloading this. 12/19/17; the small probing area in the left metatarsal head eyes think is shallower. In general her original wound looks better. We've been using Endoform. The area inferiorly that I think was trauma last week still requires debridement a lot of nonviable surface which I removed. She still has an open open area distally in her foot Similarly on the right foot there is tightly adherent surface debris which I removed. Still areas that don't look completely epithelialized. This is a small open area. We used silver alginate on these areas 12/26/2017; the patient did not have the supplies we ordered from last week including the Endoform. The original large wound on the left lateral foot looks healthy. She still has the undermining area that is largely unchanged from last week. She has the same heavily callused raised edged wounds on the right mid and left midfoot. Both of these requiring debridement. We have been using silver alginate on these areas 01/02/2018;  there is still supply issues. We are going to try to use Prisma but I am not sure she actually got it from what she is saying. She has a new open area on the lateral aspect of the left fourth toe [previous fifth ray amputation]. Still the one tunneling area over the fourth metatarsal head. The area is in the midfoot bilaterally still have thick callus around them. She is concerned about a raised swelling on the lateral aspect of the foot. However she is completely insensate 01/10/2018; we are using Prisma to the wounds on her bilateral feet. Surprisingly the tunneling area over the left fourth metatarsal head that was part of  her original surgery has closed down. She has a small open area remaining on the incision line. 2 open areas in the midfoot. 02/10/2018; the patient arrives back in clinic after a month hiatus. She was traveling to visit family in [place]. Is fairly clear she was not offloading the areas on her feet. The original wound over the left lateral foot at the level of metatarsal heads is reopened and probes medially by about a centimeter or 2. She notes that a week ago she had purulent drainage come out of an area on the left midfoot. Paradoxically the worst area is actually on the right foot is extensive with purulent drainage. We will use silver alginate today 02/17/2018; the patient has 3 wounds one over the left lateral foot. She still has a small area over the metatarsal heads which is the remnant of her original surgical wound. This has medial probing depth of roughly 1.4 cm somewhat better than last week. The area on the right foot is larger. We have been using silver alginate to all areas. The area on the right foot and left foot that we cultured last week showed both Klebsiella and Proteus. Both of these are quinolone sensitive. The patient put her's self on Bactrim and Flagyl that she had left hanging around from prior antibiotic usages. She was apparently on this last week when she arrived. I did not realize this. Unfortunately the Bactrim will not cover either 1 of these organisms. We will send in Cipro 500 twice daily for a week 03/04/2018; the patient has 2 wounds on the left foot one is the original wound which was a surgical wound for a deep DFU. At one point this had exposed bone. She still has an area over the fourth metatarsal head that probes about 1.4 cm although I think this is better than last week. I been using silver nitrate to try and promote tissue adherence and been using silver alginate here. She also has an area in the left midfoot. This has some depth but a small linear wound.  Still requiring debridement. On the right midfoot is a circular wound. A lot of thick callus around this area. We have been using silver alginate to all wound areas She is completed the ciprofloxacin I gave her 2 weeks ago. 03/11/2018; the patient continues to have 2 open areas on the left foot 1 of which was the original surgical wound for a deep DFU. Only a small probing area remains although this is not much different from last week we have been using silver alginate. The other area is on the midfoot this is smaller linear but still with some depth. We have been using silver alginate here as well On the right foot she has a small circular wound in the mid aspect. This is not much smaller than last  time. We have been using silver alginate here as well 03/18/2018; she has 3 wounds on the left foot the original surgical wound, a very superficial wound in the mid aspect and then finally the area in the mid plantar foot. She arrives in today with a very concerning area in the wound in the mid plantar foot which is her most proximal wound. There is undermining here of roughly 1-1/2 cm superiorly. Serosanguineous drainage. She tells me she had some pain on for over the weekend that shot up her foot into her thigh and she tells me that she had a nodule in the groin area. She has the single wound in the right foot. We are using endoform to both wound areas 03/24/2018; the patient arrives with the original surgical wound in the area on the left midfoot about the same as last week. There is a collection of fluid under the surface of the skin extending from the surgical wound towards the midfoot although it does not reach the midfoot wound. The area on the right foot is about the same. Cultures from last week of the left midfoot wound showed abundant Klebsiella abundant Enterococcus faecalis and moderate methicillin resistant staph I gave her Levaquin but this would have only covered the Klebsiella. She  will need linezolid 04/01/2018; she is taking linezolid but for the first few days only took 1 a day. I have advised her to finish this at twice daily dosing. In any case all of her wounds are a lot better especially on the left foot. The original surgical wound is closed. The area on the left midfoot considerably smaller. The area on the right foot also smaller. 04/08/2018; her original surgical wound/osteomyelitis on the left foot remains closed. She has area on the left foot that is in the midfoot area but she had some streaking towards this. This is not connected with her original wound at least not visually. Small wound on the right midfoot appears somewhat smaller. 04/15/18; both wounds looks better. Original wound is better left midfoot. Using silver alginate 1/21; patient states she uses saltwater soak in, stones or remove callus from around her wounds. She is also concerned about a blood blister she had on the left foot but it simply resolved on its own. We've been using silver alginate 1/28; the patient arrives today with the same streaking area from her metatarsals laterally [the site of her original surgical wound] down to the middle of her foot. There is some drainage in the subcutaneous area here. This concerns me that there is actually continued ongoing infection in the metatarsals probably the fourth and third. This fixates an MRI of the foot without contrast [chronic renal failure] The wound in the mid part of the foot is small but I wonder whether this area actually connects with the more distal foot. The area on the right midfoot is probably about the same. Callus thick skin around the small wound which I removed with a curette we have been using silver alginate on both wound areas 2/4; culture I did of the draining site on the left foot last time grew methicillin sensitive staph aureus. MRI of the left foot showed interval resolution of the findings surrounding the third metatarsal  joint on the prior study consistent with treated osteomyelitis. Chronic soft tissue ulceration in the plantar and lateral aspect of the forefoot without residual focal fluid collection. No evidence of recurrent osteomyelitis. Noted to have the previous amputation of the distal first phalanx and fifth  ray MRI of the right foot showed no evidence of osteomyelitis I am going to treat the patient with a prolonged course of antibiotics directed against MSSA in the left foot 2/11; patient continues on cephalexin. She tells me she had nausea and vomiting over the weekend and missed 2 days. In general her foot looks much the same. She has a small open area just below the left fourth metatarsal head. A linear area in the left midfoot. Some discoloration extending from the inferior part of this into the left lateral foot although this appears to be superficial. She has a small area on the right midfoot which generally looks smaller after debridement 2/18; the patient is completing his cephalexin and has another 2 days. She continues to have open areas on the left and right foot. 2/25; she is now off antibiotics. The area on the left foot at the site of her original surgical wound has closed yet again. She still has open areas in the mid part of her foot however these appear smaller. The area on the right mid foot looks about the same. We have been using silver alginate She tells me she had a serious hypoglycemic spell at home. She had to have EMS called and get IV dextrose 3/3; disappointing on the left lateral foot large area of necrotic tissue surrounding the linear area. This appears to track up towards the same original surgical wound. Required extensive debridement. The area on the right plantar foot is not a lot better also using silver 3/12; the culture I did last time showed abundant enterococcus. I have prescribed Augmentin, should cover any unrecognized anaerobes as well. In addition there were a few  MRSA and Serratia that would not be well covered although I did not want to give her multiple antibiotics. She comes in today with a new wound in the right midfoot this is not connected with the original wound over her MTP a lot of thick callus tissue around both wounds but once again she said she is not walking on these areas 3/17-Patient comes in for follow-up on the bilateral plantar wounds, the right midfoot and the left plantar wound. Both these are heavily callused surrounding the wounds. We are continuing to use silver alginate, she is compliant with offloading and states she uses a wheelchair fairly often at home 3/24; both wound areas have thick callus. However things actually look quite a bit better here for the majority of her left foot and the right foot. 3/31; patient continues to have thick callused somewhat irritated looking tissue around the wounds which individually are fairly superficial. There is no evidence of surrounding infection. We have been using silver alginate however I change that to [facility] today 4/17; patient returns to clinic after having a scare with Covid she tested negative in her primary doctor's office. She has been using Hydrofera Blue. She does not have an open area on the right foot. On the left foot she has a small open area with the mid area not completely viable. She showed me pictures of what looks like a hemorrhagic blister from several days ago but that seems to have healed over this was on the lateral left foot 4/21; patient comes in to clinic with both her wounds on her feet closed. However over the weekend she started having pain in her right foot and leg up into the thigh. She felt as though she was running a low-grade fever but did not take her temperature. She took a  doxycycline that she had leftover and yesterday a single Septra and metronidazole. She thinks things feel somewhat better. 4/28; duplex ultrasound I ordered last week was negative  for DVT or superficial thrombophlebitis. She is completed the doxycycline I gave her. States she is still having a lot of pain in the right calf and right ankle which is no better than last week. She cannot sleep. She also states she has a temperature of up to 101, coughing and complaining of visual loss in her bilateral eyes. Apparently she was tested for Covid 2 weeks ago at [facility] and that was negative. Readmission: 09/03/18 patient presents back for reevaluation after having been evaluated at the end of April regarding erythema and swelling of her right lower extremity. Subsequently she ended up going to the hospital on 07/29/18 and was admitted not to be discharged until 08/08/18. Unfortunately it was noted during the time that she was in the hospital that she did have methicillin-resistant Staphylococcus aureus as the infection noted at the site. It was also determined that she did have osteomyelitis which appears to be fairly significant. She was treated with vancomycin and in fact is still on IV vancomycin at dialysis currently. This is actually slated to continue until 09/12/18 at least which will be the completion of the six weeks of therapy. Nonetheless based on what I'm seeing at this point I'm not sure she will be anywhere near ready to discontinue antibiotics at that time. Since she was released from the hospital she was seen by [name] who is her podiatrist on 08/27/18. His note specifically states that he is recommended that the patient needs of one knee amputation on the right as she has a life- threatening situation that can lead quickly to sepsis. The patient advised she would like to try to save her leg to which [name] apparently told her that this was against all medical advice. She also want to discontinue the Wound VAC which had been initiated due to the fact that she wasn't pleased with how the wound was looking and subsequently she wanted to pursue applying Medihoney at that time.  He stated that he did not believe that the right lower extremity was salvageable and that the patient understood but would still like to attempt hyperbaric option therapy if it could be of any benefit. She was therefore referred back to [place] for further evaluation. He plans to see her back next week. Upon inspection today patient has a significant amount purulent drainage noted from the wound at this point. The bone in the distal portion of her foot also appears to be extremely necrotic and spongy. When I push down on the bone it bubbles and seeps purulent drainage from deeper in the end of the foot. I do not think that this is likely going to heal very well at all and less aggressive surgical debridement were undertaken more than what I believe we can likely do here in our office. 09/12/2018; I have not seen this patient since the most recent hospitalization although she was in our clinic last week. I have reviewed some of her records from a complex hospitalization. 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 There is situation was complicated by MRSA bacteremia and acute on chronic renal failure now on dialysis. She is receiving vancomycin at dialysis. We started her on Dakin's wet-to-dry last week she is changing this daily. There is still purulent drainage coming out of her foot.  Although she is apparently "agreeable" to a below-knee amputation which is been suggested by multiple clinicians she wants this to be done in [place]. She apparently has a telehealth visit with that provider sometime in late T[place] 6/24. I have told her I think this is probably too long. Nevertheless I could not convince her to allow a local doctor to perform BKA. 09/19/2018; the patient has a large necrotic area on the right anterior foot. She has had previous transmetatarsal amputations. Culture I did last week showed MRSA nothing else she is on vancomycin at dialysis. She  has continued leaking purulent drainage out of the distal part of the large circular wound on the right anterior foot. She apparently went to see Dr. 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 Somehow that translated into her being referred to plastic surgery for debridement of the area. I gather she basically refused amputation although I do not have a copy of Dr. Berenice Primas notes. The patient really wants to have a trial of hyperbaric oxygen. I agreed with initial assessment in this clinic that this was probably too far along to benefit however if she is going to have plastic surgery I think she would benefit from ancillary hyperbaric oxygen. The issue here is that the patient has benefited as maximally as any patient I have ever seen from hyperbaric oxygen therapy. Most recently she had exposed bone on the lateral part of her left foot after a surgical procedure and that actually has closed. She has eschared areas in both heels but no open area. She is remained systemically well. I am not optimistic that anything can be done about this but the patient is very clear that she wants an attempt. The attempt would include a wound VAC further debridements and hyperbaric oxygen along with IV antibiotics. 6/26; I put her in for a trial of hyperbaric oxygen only because of the dramatic response she has had with wounds on her left midfoot earlier this year which was a surgical wound that went straight to her bone over the metatarsal heads and also remotely the left third toe. We will see if we can get this through our review process and insurance. She arrives in clinic with again purulent material pouring out of necrotic bone on the top of the foot distally. There is also some concerning erythema on the front of the leg that we marked. It is bit difficult to tell how tender this is because of neuropathy. I note from infectious disease that she had her vancomycin extended. All the  cultures of these areas have shown MRSA sensitive to vancomycin. She had the wound VAC on for part of the week. The rest of the time she is putting various things on this including Medihoney, "ionized water" silver sorb gel etc. 7/7; follow-up along with HBO. She is still on vancomycin at dialysis. She has a large open area on the dorsal right foot and a small dark eschar area on her heel. There is a lot less erythema in the area and a lot less tenderness. From an infection point of view I think this is better. She still has a lot of necrosis in the remaining right forefoot [previous TMA] we are still using the wound VAC in this area 7/16; follow-up along with HBO. I put her on linezolid after she finished her vancomycin. We started this last Friday I gave her 2 weeks worth. I had the expectation that she would be operatively debrided by Dr. Marla Roe  but that still has not happened yet. Patient phoned the office this week. She arrives for review today after HBO. The distal part of this wound is completely necrotic. Nonviable pieces of tendon bone was still purulent drainage. Also concerning that she has black eschar over the heel that is expanding. I think this may be indicative of infection in this area as well. She has less erythema and warmth in the ankle and calf but still an abnormal exam 7/21 follow-up along with HBO. I will renew her linezolid after checking a CBC with differential monitoring her blood counts especially her platelets. She was supposed to have surgery yesterday but if I am reading things correctly this was canceled after her blood sugar was found to be over 500. I thought Dr. Marla Roe who called me said that they were sending her to the ER but the patient states that was not the case. 7/28. Follow-up along with HBO. She is on linezolid I still do not have any lab work from dialysis even though I called last week. The patient is concerned about an area on her left lateral  foot about the level of the base of her fifth metatarsal. I did not really see anything that ominous here however this patient is in [place] ability to point out problems that she is sensing and she has been accurate in the past Finally she received a call from Dr. Marla Roe who is referring her to another orthopedic surgeon stating that she is too booked up to take her to the operating room now. Was still using a wound VAC on the foot 8/3 -Follow-up after HBO, she is got another week of linezolid, she is to call ID for an appointment, x-rays of both feet were reviewed, the left foot x-ray with third MTP joint osteo- Right foot x-ray widespread osteo-in the right midfoot Right ankle x-ray does not show any active evidence of infection 8/11-Patient is seen after HBO, the wounds on the right foot appear to be about the same, the heel wound had some necrotic base over tendon that was debrided with a curette 8/21; patient is seen after HBO. The patient's wound on her dorsal foot actually looks reasonably good and there is substantial amount of epithelialization however the open area distally still has a lot of necrotic debris partially bone. I cannot really get a good sense of just how deep this probes under the foot. She has been pressuring me this week to order medical maggots through a company in [place] for her. The problem I have is there is not a defined wound area here. On the positive side there is no purulence. She has been to see infectious disease she is still on Septra DS although I have not had a chance to review their notes 8/28; patient is seen in conjunction with HBO. The wounds on her foot continued to improve including the right dorsal foot substantially the, the distal part of this wound and the area on the right heel. We have been using a wound VAC over this chronically. She is still on trimethoprim as directed by infectious disease 9/4; patient is seen in conjunction with HBO.  Right dorsal foot wound substantially anteriorly is better however she continues to have a deep wound in the distal part of this that is not responding. We have been using silver collagen under border foam Area on the right plantar medial heel seems better. We have been using Hydrofera Blue 12/12/18 on evaluation today patient appears to be  doing about the same with regard to her wound based on prior measurements. She does have some necrotic tissue noted on the lateral aspect of the wound that is going require a little bit of sharp debridement today. This includes what appears to be potentially either severely necrotic bone or tendon. Nonetheless other than that she does not appear to have any severe infection which is good news 9/18; it is been 2 weeks since I saw this wound. She is tolerating HBO well. Continued dramatic improvement in the area on the right dorsal foot. She still has a small wound on the heel that we have been using Hydrofera Blue. She continues with a wound VAC 9/24; patient has to be seen emergently today with a swelling on her right lateral lower leg. She says that she told Dr. Evette Doffing about this and also myself on a couple of occasions but I really have no recollection of this. She is not systemically unwell and her wound really looked good the last time I saw this. She showed this to providers at dialysis and she was able to verify that she was started on cephalexin today for 5 doses at dialysis. She dialyzes on Tuesday Thursday and Saturday. 10/2; patient is seen in conjunction with HBO. The area that is draining on the right anterior medial tibia is more extensive. Copious amounts of serosanguineous drainage with some purulence. We are still using the wound VAC on the original wound then it is stable. Culture I did of the original IandD showed MRSA I contacted dialysis she is now on vancomycin with dialysis treatments. I asked them to run a month 10/9; patient seen in  conjunction with HBO. She had a new spontaneous open area just above the wound on the right medial tibia ankle. More swelling on the right medial tibia. Her wound on the foot looks about the same perhaps slightly better. There is no warmth spreading up her leg but no obvious erythema. her MRI of the foot and ankle and distal tib-fib is not booked for next Friday I discussed this with her in great detail over multiple days. it is likely she has spreading infection upper leg at least involving the distal 25% above the ankle. She knows that if I refer her to orthopedics for infectious disease they are going to recommend amputation and indeed I am not against this myself. We had a good trial at trying to heal the foot which is what she wanted along with antibiotics debridement and HBO however she clearly has spreading infection [probably staph aureus/MRSA]. Nevertheless she once again tells me she wants to wait the left of the MRI. She still makes comments about having her amputation done in [place]. 10/19; arrives today with significant swelling on the lateral right leg. Last culture I did showed Klebsiella. Multidrug-resistant. Cipro was intermediate sensitivity and that is what I have her on pending her MRI which apparently is going to be done on Thursday this week although this seems to be moving back and forth. She is not systemically unwell. We are using silver alginate on her major wound area on the right medial foot and the draining areas on the right lateral lower leg 10/26; MRI showed extensive abscess in the anterior compartment of the right leg also widespread osteomyelitis involving osseous structures of the midfoot and portions of the hindfoot. Also suspicion for osteomyelitis anterior aspect of the distal medial malleolus. Culture I did of the purulence once again showed a multidrug-resistant Klebsiella. I have been  in contact with nephrology late last week and she has been started on  cefepime at dialysis to replace the vancomycin We sent a copy of her MRI report to Dr. Geroge Baseman in [place] who is an orthopedic surgeon. The patient takes great stock in his opinion on this. She says she will go to [place] to have her leg amputated if Dr. Geroge Baseman does not feel there is any salvage options. 11/2; she still is not talk to her orthopedic surgeon in [place]. Apparently he will call her at [phone] this afternoon. The quality of this is she has not allowed me to refer her anywhere. She has been told over and over that she needs this amputated but has not agreed to be referred. She tells me her blood sugar was 600 last night but she has not been febrile. 11/9; she never did got a call from the orthopedic surgeon in [place] therefore that is off the radar. We have arranged to get her see orthopedic surgery at [facility]. She still has a lot of draining purulence coming out of the new abscess in her right leg although that probably came from the osteomyelitis in her right foot and heel. Meanwhile the original wound on the right foot looks very healthy. Continued improvement. The issue is that the last MRI showed osteomyelitis in her right foot extensively she now has an abscess in the right anterior lower leg. There is nobody in [place] who will offer this woman anything but an amputation and to be honest that is probably what she needs. I think she still wants to talk about limb salvage although at this point I just do not see that. She has completed her vancomycin at dialysis which was for the original staph aureus she is still on cefepime for the more recent Klebsiella. She has had a long course of both of these antibiotics which should have benefited the osteomyelitis on the right foot as well as the abscess. 11/16; apparently [place] elective surgery is shut down because of COVID-19 pandemic. I have reached out to some contacts at [facility] to see if we can get her an  orthopedic appointment there. I am concerned about continually leaving this but for the moment everything is static. In fact her original large wound on this foot is closing down. It is the abscess on the right anterior leg that continues to drain purulent serosanguineous material. She is not currently on any antibiotics however she had a prolonged course of vancomycin [1 month] as well as cefepime for a month 02/24/2019 on evaluation today patient appears to be doing better than the last time I saw her. This is not a patient that I typically see. With that being said I am covering for Dr. Dellia Nims this week and again compared to when I last saw her overall the wounds in particular seem to be doing significantly better which is good news. With that being said the patient tells me several disconcerting things. She has not been able to get in to see anyone for potential debridement in regard to her leg wounds although she tells me that she does not think it is necessary any longer because she is taking care of that herself. She noticed a string coming out of the lower wound on her leg over the last week. The patient states that she subsequently decided that we must of pack something in there and started pulling the string out and as it kept coming and coming she realized this was likely her  tendon. With that being said she continued to remove as much of this as she could. She then I subsequently proceeded to using tubes of antibiotic ointment which she will stick down into the wound and then scored as much as she can until she sees it coming out of the other wound opening. She states that in doing this she is actually made things better and there is less redness and irritation. With regard to her foot wound she does have some necrotic tendon and tissue noted in one small corner but again the actual wound itself seems to be doing better with good granulation in general compared to my last evaluation. 12/7;  continued improvement in the wound on the substantial part of the right medial foot. Still a necrotic area inferiorly that required debridement but the rest of this looks very healthy and is contracting. She has 2 wounds on the right lateral leg which were her original drainage sites from her abscess but all of this looks a lot better as well. She has been using silver alginate after putting antibiotic biotic ointment in one wound and watching it come out the other. I have talked to her in some detail today. I had given her names of orthopedic surgeons at [facility] for second opinion on what to do about the right leg. I do not think the patient never called them. She has not been able to get a hold of the orthopedic surgeon in [place] that she had put a lot of faith in as being somebody would give her an opinion that she would trust. I talked to her today and said even if I could get her in to another orthopedic surgeon about the leg which she accept an amputation and she said she would not therefore I am not going to press this issue for the moment 12/14; continued improvement in his substantial wound on the right medial foot. There is still a necrotic area inferiorly with tightly adherent necrotic debris which I have been working on debriding each time she is here. She does not have an orthopedic appointment. Since last time she was here I looked over her cultures which were essentially MRSA on the foot wound and gram-negative rods in the abscess on the anterior leg. 12/21; continued improvement in the area on the right medial foot. She is not up on this much and that is probably a good thing since I do not know it could support continuous ambulation. She has a small area on the right lateral leg which were remanence of the IandD's I did because of the abscess. I think she should probably have prophylactic antibiotics I am going to have to look this over to see if we can make an intelligent  decision here. In the meantime her major wound is come down nicely. Necrotic area inferiorly is still there but looks a lot better 04/06/2019; she has had some improvement in the overall surface area on the right medial foot somewhat narrowedr both but somewhat longer. The areas on the right lateral leg which were initial IandD sites are superficial. Nothing is present on the right heel. We are using silver alginate to the wound areas 1/18; right medial foot somewhat smaller. Still a deep probing area in the most distal recess of the wound. She has nothing open on the right leg. She has a new wound on the plantar aspect of her left fourth toe which may have come from just pulling skin. The patient using Medihoney on  the wound on her foot under silver alginate. I cannot discourage her from this 2/1; 2-week follow-up using silver alginate on the right foot and her left fourth toe. The area on the right dorsal foot is contracted although there is still the deep area in the most distal part of the wound but still has some probing depth. No overt infection 2/15; 2-week follow-up. She continues to have improvement in the surface area on the dorsal right foot. Even the tunneling area from last time is almost closed. The area that was on the plantar part of her left fourth toe over the PIP is indeed closed 3/1; 2-week follow-up. Continued improvement in surface area. The original divot that we have been debriding inferiorly I think has full epithelialization although the epithelialization is gone down into the wound with probably 4 mm of depth. Even under intense illumination I am unable to see anything open here. The remanence of the wound in this area actually look quite healthy. We have been using silver alginate 3/15; 2-week follow-up. Unfortunately not as good today. She has a comma shaped wound on the dorsal foot however the upper part of this is larger. Under illumination debris on the surface She also  tells [place] that she was on her right leg 2 times in the last couple of weeks mostly to reach up for things above her head etc. She felt a sharp pain in the right leg which she thinks is somewhere from the ankle to the knee. The patient has neuropathy and is really uncertain. She cannot feel her foot so she does not think it was coming from there 3/29; 2-week follow-up. Her wound measures smaller. Surface of the wound appears reasonable. She is using silver alginate with underlying Medihoney. She has home health. X-rays I did of her tib-fib last time were negative although it did show arterial calcification 4/12; 2-week follow-up. Her wound measures smaller in length. Using manuka honey with silver alginate on top. She has home health. 4/26; 2-week follow-up. Her wound is smaller but still very adherent debris under illumination requiring debridement she has been using manuka honey with silver alginate. 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 Been using Hydrofera Blue. She is on suppressive Bactrim but apparently she has been taking it twice daily 6/7; I have not seen this wound and about 6 weeks. Since then she was up in [place]. By her own admission she was walking on the foot because she did not have a wheelchair. The wound is not nearly as healthy looking as it was the last time I saw this. We ordered different things for her but she only uses Medihoney and silver alginate. As far as I know she is on suppressive trimethoprim sulfamethoxazole. She does not admit to any fever or chills. Her CBGs apparently are at baseline however she is saying that she feels some discomfort on the lateral part of her ankle I looked over her last inflammatory markers from the summer 2020 at which time she had a deeply necrotic infected wound in this area. On 11/10/2018 her sedimentation rate was 56 and C-reactive  protein 9.9. This was 107 and 29 on 07/29/2018. 6/17; the patient had a necrotic wound the last time she was here on the right dorsal foot. After debridement I did a culture. This showed a very resistant ESBL Klebsiella as well as Enterococcus. Her  x-ray of the foot which was done because of warmth and some discomfort showed bone destruction within the carpal bones involving the navicular acute cuboid lateral middle cuneiforms but essentially unchanged from her prior study which was done on 10/29/2018. The findings were felt to represent chronic osteomyelitis. We did inflammatory markers on her. Her white count was 5.25 sedimentation rate 16 and C-reactive protein at 11.1. Notable for the fact that in August 2020 her CRP was 9.9 and sedimentation rate 56. I have looked at her x-rays. It is true that the bone destruction is very impressive however the patient came into this clinic for the wound on her right foot with pieces of bone literally falling out anteriorly with purulent material. I am not exactly sure I could have expected anything different. She has not been systemically unwell no fever chills or blood sugars have been reasonable. 6/28; she arrives with a right heel closed. The substantial area on the right anterior foot looks healthy. Much better looking surface. I think we can change to [facility] seems to help this previously. She is getting her antibiotics at dialysis she should be just about finished 7/9; changed to [facility] last week. Surface wound looks satisfactory not much change in surface area however. She is going to [place] state next week this is usually a difficult thing for this patient follow-up will be for 2 weeks. 7/23; using Hydrofera Blue. She returns from her trip and the wound looks surprisingly good. Usually when this patient goes on trips she comes back with a lot of problems with the wound. She is saying that she sometimes feels an episodic "crunching"  feeling on the lateral part of the foot. She is neuropathic and not feeling pain but wonders whether this could be a neuropathic dysesthesia. 11/13/19-Patient returns after 3 weeks, the wound itself is stable and patient states that there is nothing new going on she is on some extra anxiety medications and is resisting the temptation to pick at the dry skin around the wound. Electronic Signature(s) Signed: 11/13/2019 1:41:47 PM By: Tobi Bastos MD, MBA Entered By: Tobi Bastos on 11/13/2019 13:41:46 -------------------------------------------------------------------------------- Physical Exam Details Patient Name: Date of Service: [name] 11/13/2019 1:15 PM Medical Record Number: [id number] Patient Account Number: [id number] Date of Birth/Sex: Treating RN: 05-18-1970 (49 y.o. F) [name] Primary Care Provider: [name], [name] Other Clinician: Referring Provider: Treating Provider/Extender: Tobi Bastos MO REIRA, [name] Weeks in Treatment: 15 Constitutional alert and oriented x 3. sitting or standing blood pressure is within target range for patient.. supine blood pressure is within target range for patient.. pulse regular and within target range for patient.Marland Kitchen respirations regular, non-labored and within target range for patient.Marland Kitchen temperature within target range for patient.. . . Well- nourished and well-hydrated in no acute distress. Notes Right transmetatarsal amputation site wound with previous osteomyelitis, looking stable, surrounding skin is dry but otherwise intact Electronic Signature(s) Signed: 11/13/2019 1:42:19 PM By: Tobi Bastos MD, MBA Entered By: Tobi Bastos on 11/13/2019 13:42:18 -------------------------------------------------------------------------------- Physician Orders Details Patient Name: Date of Service: [name] 11/13/2019 1:15 PM Medical Record Number: [id number] Patient Account Number: [id number] Date of  Birth/Sex: Treating RN: 08-05-1970 (49 y.o. [name] Primary Care Provider: [name], [name] Other Clinician: Referring Provider: Treating Provider/Extender: Tobi Bastos MO REIRA, [name] Weeks in Treatment: 61 Verbal / Phone Orders: No Diagnosis Coding ICD-10 Coding Code Description M86.671 Other chronic osteomyelitis, right  ankle and foot L97.514 Non-pressure chronic ulcer of other part of right foot with necrosis of bone L97.411 Non-pressure chronic ulcer of right heel and midfoot limited to breakdown of skin E10.621 Type 1 diabetes mellitus with foot ulcer Follow-up Appointments Return appointment in 3 weeks. Dressing Change Frequency Wound #43 Right,Medial Foot Change Dressing every other day. Wound Cleansing Clean wound with Wound Cleanser - or normal saline Primary Wound Dressing Wound #43 Right,Medial Foot Hydrofera Blue - Classic Secondary Dressing Wound #43 Right,Medial Foot Kerlix/Rolled Gauze Dry Gauze Edema Control Elevate legs to the level of the heart or above for 30 minutes daily and/or when sitting, a frequency of: - throughout the day [facility] for wound care. - Interim Electronic Signature(s) Signed: 11/13/2019 4:53:28 PM By: [name] Signed: 11/13/2019 4:53:36 PM By: Tobi Bastos MD, MBA Entered By: [name] on 11/13/2019 13:41:27 -------------------------------------------------------------------------------- Problem List Details Patient Name: Date of Service: [name] 11/13/2019 1:15 PM Medical Record Number: [id number] Patient Account Number: [id number] Date of Birth/Sex: Treating RN: 01/09/1971 (49 y.o. F) Dwiggins, Shannon Primary Care Provider: [name], [name] Other Clinician: Referring Provider: Treating Provider/Extender: Tobi Bastos MO REIRA, [name] Weeks in Treatment: 68 Active Problems ICD-10 Encounter Code Description Active Date  MDM Diagnosis M86.671 Other chronic osteomyelitis, right ankle and foot 09/03/2018 No Yes L97.514 Non-pressure chronic ulcer of other part of right foot with necrosis of bone 09/03/2018 No Yes L97.411 Non-pressure chronic ulcer of right heel and midfoot limited to breakdown of 09/17/2019 No Yes skin E10.621 Type 1 diabetes mellitus with foot ulcer 09/24/2018 No Yes Inactive Problems ICD-10 Code Description Active Date Inactive Date L97.521 Non-pressure chronic ulcer of other part of left foot limited to breakdown of skin 04/20/2019 04/20/2019 L97.812 Non-pressure chronic ulcer of other part of right lower leg with fat layer exposed 02/24/2019 02/24/2019 Resolved Problems ICD-10 Code Description Active Date Resolved Date L02.415 Cutaneous abscess of right lower limb 12/25/2018 12/25/2018 Electronic Signature(s) Signed: 11/13/2019 4:53:28 PM By: [name] Signed: 11/13/2019 4:53:36 PM By: Tobi Bastos MD, MBA Entered By: [name] on 11/13/2019 13:40:33 -------------------------------------------------------------------------------- Progress Note Details Patient Name: Date of Service: [name] 11/13/2019 1:15 PM Medical Record Number: [id number] Patient Account Number: [id number] Date of Birth/Sex: Treating RN: 1970/04/07 (48 y.o. [name] Primary Care Provider: [name], [name] Other Clinician: Referring Provider: Treating Provider/Extender: Tobi Bastos MO REIRA, [name] Weeks in Treatment: 5 Subjective History of Present Illness (HPI) 49 year old diabetic who is known to have type 1 diabetes which is poorly controlled last hemoglobin A1c was 11%. She comes in with a ulcerated area on the left lateral foot which has been there for over 6 months. Was recently she has been treated by [name] of podiatry who saw her last on 05/28/2016. Review of his notes revealed that the patient had incision and drainage with placement of antibiotic beads to  the left foot on 04/11/2016 for possible osteomyelitis of the cuboid bone. Over the last year she's had a history of amputation of the left fifth toe and a femoropopliteal popliteal bypass graft somewhere in April 2017. 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 His note [name] mentions that the patient has been referred to me for further wound care and possibly great candidate for hyperbaric oxygen therapy due to recurrent osteomyelitis. However we do not have any x-rays of biopsy reports confirming this. He has been on  several antibiotics including Bactrim and most recently is on doxycycline for an MRSA. I understand, the patient was not a candidate for IV antibiotics as she has had previous PICC lines which resulted in blood clots in both arms. There was a x-ray report dated 04/04/2016 on [name] notes which showed evidence of fifth ray resection left foot with osteolytic changes noted to the fourth metatarsal and cuboid bone on the left. 06/13/2016 -- had a left foot x-ray which showed no acute fracture or dislocation and no definite radiographic evidence of osteomyelitis. Advanced osteopenia was seen. 06/20/2016 -- she has noticed a new wound on the right plantar foot in the region where she had a callus before. 06/27/16- the patient did have her x-ray of the right foot which showed no findings to suggest osteomyelitis. She saw her endocrinologist, [name], yesterday. Her A1c in January was 11. He also indicates mismanagement and noncompliance regarding her diabetes. She is currently on Bactrim for a lip infection. She is complaining of nausea, vomiting and diarrhea. She is unable to articulate the exact orders or dosing of the Bactrim; it is unclear when she will complete this. 07/04/2016 -- results from [facility] of ABIs with ankle waveforms were noted from 02/14/2016. The examination done on 06/27/2015 showed noncompressible ABIs with the right being 1.45 and the left  being 1.33. The present examination showed a right ABI of 1.19 on the left of 1.33. The conclusion was that right normal ABI in the lower extremity at rest however compared to previous study which was noncompressible ABI may be falsely elevated side suggesting medial calcification. The left ABI suggested medial calcification. 08/01/2016 -- the patient had more redness and pain on her right foot and did not get to come to see as noted she see her PCP or go to the ER and decided to take some leftover metronidazole which she had at home. As usual, the patient does report she feels and is rather noncompliant. 08/08/2016 -- -- x-ray of the right foot -- FINDINGS:Transmetatarsal amputation is noted. No bony destruction is noted to suggest osteomyelitis. IMPRESSION: No evidence of osteomyelitis. Postsurgical changes are seen. MRI would be more sensitive for possible bony changes. Culture has grown Serratia Marcescens -- sensitive to Bactrim, ciprofloxacin, ceftazidime she was seen by [name] on 08/06/2016. He did not find any exposed bone, muscle, tendon, ligament or joint. There was no malodor and he did a excisional debridement in the office. ============ Old notes: 49 year old patient who is known to the wound clinic for a while had been away from the wound clinic since 09/01/2014. Over the last several months she has been admitted to various hospitals including [facility] [place]. She was treated for a right metatarsal osteomyelitis with a transmetatarsal amputation and this was done about 2 months ago. He has a small ulcerated area on the right heel and she continues to have an ulcerated area on the left plantar aspect of the foot. The patient was recently admitted to the [facility] hospital group between 7/12 and 10/18/2014. she was given 3 weeks of IV vancomycin and was to follow-up with her surgeons at [facility] and also took oral vancomycin for C. difficile colitis. 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 MRI of the left foot done  on 10/14/2014 showed no abscess or osteomyelitis. 04/27/15; this is a patient we know from previous stays in the wound care center. She is a type I diabetic I am not sure of her control currently. Since the last time I saw her she is had a right transmetatarsal amputation and has no wounds on her right foot and has no open wounds. She is been followed at the wound care center at [facility] in [place]. She comes today with the desire to undergo hyperbaric treatment locally. Apparently one of her wound care providers in [place] has suggested hyperbarics. This is in response to an MRI from 04/18/15 that showed increased marrow signal and loss of the proximal fifth metatarsal cortex evidence of osteomyelitis with likely early osteomyelitis in the cuboid bone as well. She has a large wound over the base of the fifth metatarsal. She also has a eschar over her the tips of her toes on 1,3 and 5. She does not have peripheral pulses and apparently is going for an angiogram tomorrow which seems reasonable. After this she is going to infectious disease at [name]. They have been using Medihoney to the large wound on the lateral aspect of the left foot to. The patient has known Charcot deformity from diabetic neuropathy. She also has known diabetic PAD. Surprisingly I can't see that she has had any recent antibiotics, the patient states the last antibiotic she had was at the end of November for 10 days. I think this was in response to culture that showed group G strep although I'm not exactly sure where the culture was from. She is also had arterial studies on 03/29/15.  This showed a right ABI of 1.4 that was noncompressible. Her left ABI was 0.73. 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 It was not felt that arterial inflow was adequate for healing of a foot ulcer. Her Doppler waveforms looked monophasic ===== READMISSION 02/28/17; this is in an now 49 year old woman we've had at several different occasions in this clinic. 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 She has a remote ex-smoker. She was last seen in this clinic by Dr. Con Memos I think in May. More recently she is been followed by her podiatrist [name] an infectious disease Dr. Megan Salon. She has 2 open wounds the major one is over the right first metatarsal head she also has a wound on the left plantar foot. an MRI of the right foot on 01/01/17 showed a soft tissue ulcer along the plantar aspect of the first metatarsal base consistent with osteomyelitis of the first metatarsal stump. Dr. 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 According to the patient this is been open for slightly over a month. She has been on a combination of Cipro 500 twice a day, Zyvox 600 twice a day and Flagyl 500 3 times a day for over a month now as directed by Dr. Megan Salon. cultures of the right foot earlier this year showed MRSA in January and Serratia in May. January also had a few viridans strep. Recent x-rays of both feet were done and [name] office and I don't have these reports. The patient has known PAD and has a history of aleft femoropopliteal bypass in April 2017. 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 Dwyane Dee. In spite of this the last hemoglobin A1c I can see is 10.1 on 01/01/2017. She is being referred by [name] for consideration of hyperbaric  oxygen for chronic refractory osteomyelitis involving the right first metatarsal head with a Wagner 3 wound over this area. She is been using Medihoney to this area and also an area on the left midfoot. She is using healing sandals bilaterally. ABIs in this clinic at the left posterior tibial was 1.1 noncompressible on the right READMISSION Non invasive vascular NOVANT 5/18 Aftercare following surgery of the circulatory system Procedure Note - Interface, External Ris In - 08/13/2016 11:05 AM EDT Procedure: Examination consists of physiologic resting arterial pressures of the brachial and ankle arteries bilaterally with continuous wave Doppler waveform analysis. Previous: Previous exam performed on 02/14/16 demonstrated ABIs of Rt = 1.19 and Lt = 1.33. Right: ABI = non-compressible PT 1.47 DP. S/P transmet amputation. , Left: ABI = 1.52, 2nd digit pressure = 87 mmHg Conclusions: Right: ABI (>1.3) may be falsely elevated, suggesting medial calcification. Left: ABI (>1.3) may be falsely elevated, suggesting medial calcification The patient is a now 49 year old type I diabetic is had multiple issues her graded to chronic diabetic foot ulcers. 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 We had her in the clinic lastin November. At that point she had wounds on her bilateral feet.she had wanted to try hyperbarics however the healogics review process denied her because she hadn't followed up with her vascular surgeon for her left femoropopliteal bypass. The bypass was done by Dr. Raul Del at [facility]. We made her a follow-up with Dr. Raul Del however she did not keep the appointment and therefore she was not approved The patient shows me a small wound on her left fourth metatarsal head on her phone. She developed rapid discoloration in the plantar aspect of the left foot and she was admitted to hospital from 2/2 through  05/10/17 with wet gangrene of the left foot osteomyelitis of the fourth metatarsal heads. She was admitted acutely ill with a temperature of 103. She was started on broad-spectrum vancomycin and cefepime. On 05/06/17 she was taken to the OR by [name] her podiatric surgeon for an incision and drainage irrigation of the left foot wound. Cultures from this surgery revealed group be strep and anaerobes. she was seen by Dr.Xu of orthopedic surgery and scheduled for a below-knee amputation which she u refused. Ultimately she was discharged on Levaquin and Flagyl for one month. MRI 05/05/17 done while she was in the hospital showed abscess adjacent to the fourth metatarsal head and neck small abscess around the fourth flexor tendon. Inflammatory phlegmon and gas in the soft tissues along the lateral aspect of the fourth phalanx. Findings worrisome for osteomyelitis involving the fourth proximal and middle phalanx and also the third and fourth metatarsals. Finally the patient had actually shortly before this followed up with Dr. Raul Del at no time on 04/29/17. He felt that her left femoropopliteal bypass was patent he felt that her left-sided toe pressures more than adequate for healing a wound on the left foot. This was before her acute presentation. 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 05/28/17; she is started hyperbarics. The patient tells me that for some reason she was not actually on Levaquin but I think on ciprofloxacin. She was on Flagyl. She only started her Levaquin yesterday due to some difficulty with the pharmacy and perhaps her sister picking it up. She  has an appointment with [name] tomorrow and with infectious disease early next week. She has no new complaints 06/06/17; the patient continues in hyperbarics. She saw [name] on 05/29/17 who is her podiatric surgeon. He is elected for a transmetatarsal amputation on 06/27/17. I'm not sure at what level he plans to do this amputation. The  patient is unaware ooShe also saw Dr. Megan Salon of infectious disease who elected to continue her on current antibiotics I think this is ciprofloxacin and Flagyl. I'[name] need to clarify with her tomorrow if she actually has this. We're using silver alginate to the actual wound. Necrotic surface today with material under the flap of her foot. ooOriginal MRI showed abscesses as well as osteomyelitis of the proximal and middle fourth phalanx and the third and fourth metatarsal heads 06/11/17; patient continues in hyperbarics and continues on oral antibiotics. She is doing well. The wound looks better. The necrotic part of this under the flap in her superior foot also looks better. she is been to see [name]. I haven't had a chance to look at his note. Apparently he has put the transmetatarsal amputation on hold her request it is still planning to take her to the OR for debridement and product application ACEL. I'[name] see if I can find his note. I'[name] therefore leave product ordering/requests to [name] for now. I was going to look at Dermagraft 06/18/17-she is here in follow-up evaluation for bilateral foot wounds. She continues with hyperbaric therapy. She states she has been applying manuka honey to the right plantar foot and alternate manuka honey and silver alginate to the left foot, despite our orders. We will continue with same treatment plan and she will follo up next week. 06/25/17; I have reviewed [name] last note from 3/11. She has operative debridement in 2 days' time. By review his note apparently they're going to place there is skin over the majority of this wound which is a good choice. She has a small satellite area at the most proximal part of this wound on the left plantar foot. The area on the right plantar foot we've been using silver alginate and it is close to healing. 07/02/17; unfortunately the patient was not easily approved for [name] proposed surgery. I'm not completely certain  what the issue is. She has been using silver alginate to the wound she has completed a first course of hyperbarics. She is still on Levaquin and Flagyl. I have really lost track of the time course here.I suspect she should have another week to 2 of antibiotics. I'[name] need to see if she is followed up with infectious disease Dr. Megan Salon 07/09/17; the patient is followed up with Dr. Megan Salon. 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 She continues on Levaquin and metronidazole continuing both of these for now I think she is been on fr about 6 weeks. She still has some drainage but no pain. No fever. Her had been plans for her to go to the OR for operative debridement with her podiatrist [name], I am not exactly sure where that is. I'[name] probably slip a note to [name] today. I note that she follows with Dr. Dwyane Dee of endocrinology. We have her recertified for hyperbaric oxygen. I have not heard about Dermagraft however I'[name] see if [name] is planning a skin substitute as well 07/16/17; the patient tells me she is just about out of V[place]. I'[name] need to check Dr.  Campbell's last notes on this. She states she has plenty of Flagyl however. She comes in today complaining of pain in the right lateral foot which she said lasted for about a day. The wound on the right foot is actually much more medially. She also tells me that the [facility] cost a lot of pain in the left foot wound and she turned back to silver alginate. Finally Dermagraft has a $314 per application co-pay. She cannot afford this 07/23/17; patient arrives today with the wound not much smaller. There is not much new to add. She has not heard from [name] all try to put in a call to them today. She was asking about Dermagraft again and she has an over $970 per application co-pay she states that she would be willing to try to do a payment plan. I been tried to avoid this. We've been using silver  alginate, I'[name] change to [facility] 07/30/17-She is here in follow-up evaluation for left foot ulcer. She continues hyperbaric medicine. The left foot ulcer is stable we will continue with same treatment plan 08/06/17; she is here for evaluation of her left foot ulcer. Currently being treated for hyperbarics or underlying osteomyelitis. She is completed antibiotics. The left foot ulcer is better smaller with healthier looking granulation. For various reasons I am not really clear on we never got her back to the OR with [name]. He did not respond to my secure text message. Nevertheless I think that surgery on this point is not necessary nor am I completely clear that a skin substitute is necessary The patient is complaining about pain on the outside of her right foot. 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 There is no erythema. She also states the foot is warm versus her other part of her upper leg and this is largely true. It is not totally clear to me what's causing this. She thinks it's different from her usual neuropathy pain 08/13/17; she arrives in clinic today with a small wound which is superficial on her right first metatarsal head. 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 She tells [place] she was up on her feet over the Mother's Day celebration. ooThe large wound is on the left foot. Continues with hyperbarics for underlying osteomyelitis. We're using Hydrofera Blue. She asked me today about where we were with Dermagraft. I had actually excluded this because of the co-pay however she wants to assume this therefore I'[name] recheck the co-pay an order for next week. 08/20/17; the patient agreed to accept the co-pay of the first Apligraf which we applied today. She is disappointed she is finishing hyperbarics will run this through the insurance on the extent of the foot infection and the extent of the wound that she had however she is already had 60  dive's. Dermagraft No. 1 08/27/17; Dermagraft No. 2. She is not eligible for any more hyperbaric treatments this month. She reports a fair amount of drainage and she actually changed to the external dressings without disturbing the direct contact layer 09/03/17; the patient arrived in clinic today with the wound superficially looking quite healthy. Nice vibrant red tissue with some advancing epithelialization although not as much adherence of the flap as I might like. However she noted on her own fourth toe some bogginess and she brought that to our attention. Indeed this was boggy feeling like a possibility of subcutaneous fluid. She stated that this was similar to how an issue came up on the lateral  foot that led to her fifth ray amputation. She is not been unwell. We've been using Dermagraft 09/10/17; the culture that I did not last week was MRSA. She saw Dr. Megan Salon this morning who is going to start her on vancomycin. I had sent him a secure a text message yesterday. I also spoke with her podiatric surgeon [name] about surgery on this foot the options for conserving a functional foot etc. Promised me he would see her and will make back consultation today. Paradoxically her actual wound on the plantar aspect of her left foot looks really quite good. I had given her 5 days worth of Baxdella to cover her for MRSA. Her MRI came back showing osteomyelitis within the third metatarsal shaft and head and base of the third and fourth proximal phalanx. She had extensive inflammatory changes throughout the soft tissue of the lateral forefoot. With an ill-defined fluid around the fourth metatarsal extending into the plantar and dorsal soft tissues 09/19/17; the patient is actually on oral Septra and Flagyl. She apparently refused IV vancomycin. She also saw [name] at my request who is planning her for a left BKA sometime in mid July. MRI showed osteomyelitis within the third metatarsal shaft and head and  the basis of the third and fourth proximal phalanx. I believe there was felt to be possible septic arthritis involving the third MTP. 09/26/17; the patient went back to Dr. Megan Salon at my suggestion and is now receiving IV daptomycin. Her wound continues to look quite good making the decision to proceed with a transmetatarsal amputation although more difficult for the patient. I believe in my extensive discussions with her she has a good sense of the pros and cons of this. I don't NV the tuft decision she has to make. She has an appointment with [name] I believe in mid July and I previously spoken to him about this issue Has we had used 3 previous Dermagraft. Given the condition of the wound surface I went ahead and added the fourth one today area and I did this not fully realizing that she'[name] be traveling to [place] next week. I'm hopeful she can come back in 2 weeks 10/21/17; Her same Dermagraft on for about 3-1/2 weeks. In spite of this the wound arrives looking quite healthy. There is been a lot of healing dimensions are smaller. Looking at the square shaped wound she has now there is some undermining and some depth medially under the undermining although I cannot palpate any bone. No surrounding infection is obvious. She has difficult questions about how to look at this going forward vis--vis amputations versus continued medical therapy. T be truthful the wound is looks so o healthy and it is continued to contract. Hard to justify foot surgery at this point although I still told her that I think it might come to that if we are not able to eradicate the underlying MRSA. She is still highly at risk and she understands this 11/06/17 on evaluation today patient appears to be doing better in regard to her foot ulcer. She's been tolerating the dressing changes without complication. Currently she is here for her Dermagraft #6. Her wound continues to make excellent progress at this point. She does not  appear to have any evidence of infection which is good news. 11/13/17 on evaluation today patient appears to be doing excellent at this time. She is here for repeat Dermagraft application. This is #7. Overall her wound seems to be making great progress. 12/05/17;  the patient arrives with the wound in much better condition than when I last saw this almost 6 weeks ago. She still has a small probing area in the left metatarsal head region on the lateral aspect of her foot. We applied her last Dermagraft today. ooSince the last time she is here she has what appears to a been a blood blister on the plantar aspect of left foot although I don't see this is threatening. There is also a thick raised tissue on the right mid metatarsal head region. This was not there I don't think the last time she was here 3 weeks ago. 12/12/17; the patient continues to have a small programming area in the left metatarsal head region on the lateral aspect of her foot which was the initial large surgical wound. I applied her last Apligraf last week. I'm going to use Endoform starting today ooUnfortunately she has an excoriated area in the left mid foot and the right mid foot. The left midfoot looks like a blistered area this was not opened last week it certainly is open today. Using silver alginate on these areas. She promises me she is offloading this. 12/19/17; the small probing area in the left metatarsal head eyes think is shallower. In general her original wound looks better. We've been using Endoform. The area inferiorly that I think was trauma last week still requires debridement a lot of nonviable surface which I removed. She still has an open open area distally in her foot ooSimilarly on the right foot there is tightly adherent surface debris which I removed. Still areas that don't look completely epithelialized. This is a small open area. We used silver alginate on these areas 12/26/2017; the patient did not have the  supplies we ordered from last week including the Endoform. The original large wound on the left lateral foot looks healthy. She still has the undermining area that is largely unchanged from last week. She has the same heavily callused raised edged wounds on the right mid and left midfoot. Both of these requiring debridement. We have been using silver alginate on these areas 01/02/2018; there is still supply issues. We are going to try to use Prisma but I am not sure she actually got it from what she is saying. She has a new open area on the lateral aspect of the left fourth toe [previous fifth ray amputation]. Still the one tunneling area over the fourth metatarsal head. The area is in the midfoot bilaterally still have thick callus around them. She is concerned about a raised swelling on the lateral aspect of the foot. However she is completely insensate 01/10/2018; we are using Prisma to the wounds on her bilateral feet. Surprisingly the tunneling area over the left fourth metatarsal head that was part of her original surgery has closed down. She has a small open area remaining on the incision line. 2 open areas in the midfoot. 02/10/2018; the patient arrives back in clinic after a month hiatus. She was traveling to visit family in [place]. Is fairly clear she was not offloading the areas on her feet. The original wound over the left lateral foot at the level of metatarsal heads is reopened and probes medially by about a centimeter or 2. She notes that a week ago she had purulent drainage come out of an area on the left midfoot. Paradoxically the worst area is actually on the right foot is extensive with purulent drainage. We will use silver alginate today 02/17/2018; the patient has 3  wounds one over the left lateral foot. She still has a small area over the metatarsal heads which is the remnant of her original surgical wound. This has medial probing depth of roughly 1.4 cm somewhat better than last  week. The area on the right foot is larger. We have been using silver alginate to all areas. The area on the right foot and left foot that we cultured last week showed both Klebsiella and Proteus. Both of these are quinolone sensitive. The patient put her's self on Bactrim and Flagyl that she had left hanging around from prior antibiotic usages. She was apparently on this last week when she arrived. I did not realize this. Unfortunately the Bactrim will not cover either 1 of these organisms. We will send in Cipro 500 twice daily for a week 03/04/2018; the patient has 2 wounds on the left foot one is the original wound which was a surgical wound for a deep DFU. At one point this had exposed bone. She still has an area over the fourth metatarsal head that probes about 1.4 cm although I think this is better than last week. I been using silver nitrate to try and promote tissue adherence and been using silver alginate here. ooShe also has an area in the left midfoot. This has some depth but a small linear wound. Still requiring debridement. ooOn the right midfoot is a circular wound. A lot of thick callus around this area. ooWe have been using silver alginate to all wound areas ooShe is completed the ciprofloxacin I gave her 2 weeks ago. 03/11/2018; the patient continues to have 2 open areas on the left foot 1 of which was the original surgical wound for a deep DFU. Only a small probing area remains although this is not much different from last week we have been using silver alginate. The other area is on the midfoot this is smaller linear but still with some depth. We have been using silver alginate here as well ooOn the right foot she has a small circular wound in the mid aspect. This is not much smaller than last time. We have been using silver alginate here as well 03/18/2018; she has 3 wounds on the left foot the original surgical wound, a very superficial wound in the mid aspect and then finally  the area in the mid plantar foot. She arrives in today with a very concerning area in the wound in the mid plantar foot which is her most proximal wound. There is undermining here of roughly 1-1/2 cm superiorly. Serosanguineous drainage. She tells me she had some pain on for over the weekend that shot up her foot into her thigh and she tells me that she had a nodule in the groin area. ooShe has the single wound in the right foot. ooWe are using endoform to both wound areas 03/24/2018; the patient arrives with the original surgical wound in the area on the left midfoot about the same as last week. There is a collection of fluid under the surface of the skin extending from the surgical wound towards the midfoot although it does not reach the midfoot wound. The area on the right foot is about the same. Cultures from last week of the left midfoot wound showed abundant Klebsiella abundant Enterococcus faecalis and moderate methicillin resistant staph I gave her Levaquin but this would have only covered the Klebsiella. She will need linezolid 04/01/2018; she is taking linezolid but for the first few days only took 1 a day. I  have advised her to finish this at twice daily dosing. In any case all of her wounds are a lot better especially on the left foot. The original surgical wound is closed. The area on the left midfoot considerably smaller. The area on the right foot also smaller. 04/08/2018; her original surgical wound/osteomyelitis on the left foot remains closed. She has area on the left foot that is in the midfoot area but she had some streaking towards this. This is not connected with her original wound at least not visually. ooSmall wound on the right midfoot appears somewhat smaller. 04/15/18; both wounds looks better. Original wound is better left midfoot. Using silver alginate 1/21; patient states she uses saltwater soak in, stones or remove callus from around her wounds. She is also concerned  about a blood blister she had on the left foot but it simply resolved on its own. We've been using silver alginate 1/28; the patient arrives today with the same streaking area from her metatarsals laterally [the site of her original surgical wound] down to the middle of her foot. There is some drainage in the subcutaneous area here. This concerns me that there is actually continued ongoing infection in the metatarsals probably the fourth and third. This fixates an MRI of the foot without contrast [chronic renal failure] ooThe wound in the mid part of the foot is small but I wonder whether this area actually connects with the more distal foot. ooThe area on the right midfoot is probably about the same. Callus thick skin around the small wound which I removed with a curette we have been using silver alginate on both wound areas 2/4; culture I did of the draining site on the left foot last time grew methicillin sensitive staph aureus. MRI of the left foot showed interval resolution of the findings surrounding the third metatarsal joint on the prior study consistent with treated osteomyelitis. Chronic soft tissue ulceration in the plantar and lateral aspect of the forefoot without residual focal fluid collection. No evidence of recurrent osteomyelitis. 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 She tells me she had nausea and vomiting over the weekend and missed 2 days. In general her foot looks much the same. She has a small open area just below the left fourth metatarsal head. A linear area in the left midfoot. Some discoloration extending from the inferior part of this into the left lateral foot although this appears to be superficial. She has a small area on  the right midfoot which generally looks smaller after debridement 2/18; the patient is completing his cephalexin and has another 2 days. She continues to have open areas on the left and right foot. 2/25; she is now off antibiotics. The area on the left foot at the site of her original surgical wound has closed yet again. She still has open areas in the mid part of her foot however these appear smaller. The area on the right mid foot looks about the same. We have been using silver alginate She tells me she had a serious hypoglycemic spell at home. She had to have EMS called and get IV dextrose 3/3; disappointing on the left lateral foot large area of necrotic tissue surrounding the linear area. This appears to track up towards  the same original surgical wound. Required extensive debridement. The area on the right plantar foot is not a lot better also using silver 3/12; the culture I did last time showed abundant enterococcus. I have prescribed Augmentin, should cover any unrecognized anaerobes as well. In addition there were a few MRSA and Serratia that would not be well covered although I did not want to give her multiple antibiotics. She comes in today with a new wound in the right midfoot this is not connected with the original wound over her MTP a lot of thick callus tissue around both wounds but once again she said she is not walking on these areas 3/17-Patient comes in for follow-up on the bilateral plantar wounds, the right midfoot and the left plantar wound. Both these are heavily callused surrounding the wounds. We are continuing to use silver alginate, she is compliant with offloading and states she uses a wheelchair fairly often at home 3/24; both wound areas have thick callus. However things actually look quite a bit better here for the majority of her left foot and the right foot. 3/31; patient continues to have thick callused somewhat irritated looking tissue around the wounds which  individually are fairly superficial. There is no evidence of surrounding infection. We have been using silver alginate however I change that to [facility] today 4/17; patient returns to clinic after having a scare with Covid she tested negative in her primary doctor's office. She has been using Hydrofera Blue. She does not have an open area on the right foot. On the left foot she has a small open area with the mid area not completely viable. She showed me pictures of what looks like a hemorrhagic blister from several days ago but that seems to have healed over this was on the lateral left foot 4/21; patient comes in to clinic with both her wounds on her feet closed. However over the weekend she started having pain in her right foot and leg up into the thigh. She felt as though she was running a low-grade fever but did not take her temperature. She took a doxycycline that she had leftover and yesterday a single Septra and metronidazole. She thinks things feel somewhat better. 4/28; duplex ultrasound I ordered last week was negative for DVT or superficial thrombophlebitis. She is completed the doxycycline I gave her. States she is still having a lot of pain in the right calf and right ankle which is no better than last week. She cannot sleep. She also states she has a temperature of up to 101, coughing and complaining of visual loss in her bilateral eyes. Apparently she was tested for Covid 2 weeks ago at [facility] and that was negative. Readmission: 09/03/18 patient presents back for reevaluation after having been evaluated at the end of April regarding erythema and swelling of her right lower extremity. Subsequently she ended up going to the hospital on 07/29/18 and was admitted not to be discharged until 08/08/18. Unfortunately it was noted during the time that she was in the hospital that she did have methicillin-resistant Staphylococcus aureus as the infection noted at the site. It was also determined  that she did have osteomyelitis which appears to be fairly significant. She was treated with vancomycin and in fact is still on IV vancomycin at dialysis currently. This is actually slated to continue until 09/12/18 at least which will be the completion of the six weeks of therapy. Nonetheless based on what I'm seeing at this point I'm not sure  she will be anywhere near ready to discontinue antibiotics at that time. Since she was released from the hospital she was seen by [name] who is her podiatrist on 08/27/18. His note specifically states that he is recommended that the patient needs of one knee amputation on the right as she has a life- threatening situation that can lead quickly to sepsis. The patient advised she would like to try to save her leg to which [name] apparently told her that this was against all medical advice. She also want to discontinue the Wound VAC which had been initiated due to the fact that she wasn't pleased with how the wound was looking and subsequently she wanted to pursue applying Medihoney at that time. He stated that he did not believe that the right lower extremity was salvageable and that the patient understood but would still like to attempt hyperbaric option therapy if it could be of any benefit. She was therefore referred back to [place] for further evaluation. He plans to see her back next week. Upon inspection today patient has a significant amount purulent drainage noted from the wound at this point. The bone in the distal portion of her foot also appears to be extremely necrotic and spongy. When I push down on the bone it bubbles and seeps purulent drainage from deeper in the end of the foot. I do not think that this is likely going to heal very well at all and less aggressive surgical debridement were undertaken more than what I believe we can likely do here in our office. 09/12/2018; I have not seen this patient since the most recent hospitalization although she  was in our clinic last week. I have reviewed some of her records from a complex hospitalization. 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 There is situation was complicated by MRSA bacteremia and acute on chronic renal failure now on dialysis. She is receiving vancomycin at dialysis. We started her on Dakin's wet-to-dry last week she is changing this daily. There is still purulent drainage coming out of her foot. Although she is apparently "agreeable" to a below-knee amputation which is been suggested by multiple clinicians she wants this to be done in [place]. She apparently has a telehealth visit with that provider sometime in late [place] 6/24. I have told her I think this is probably too long. Nevertheless I could not convince her to allow a local doctor to perform BKA. 09/19/2018; the patient has a large necrotic area on the right anterior foot. She has had previous transmetatarsal amputations. Culture I did last week showed MRSA nothing else she is on vancomycin at dialysis. She has continued leaking purulent drainage out of the distal part of the large circular wound on the right anterior foot. She apparently went to see Dr. 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 Somehow that translated into her being referred to plastic surgery for debridement of the area. I gather she basically refused amputation although I do not have a copy of Dr. Berenice Primas notes. The patient really wants to have a trial of hyperbaric oxygen. I agreed with initial assessment in this clinic that this was probably too far along to benefit however if she is going to have plastic surgery I think she would benefit from ancillary hyperbaric oxygen. The issue here is that the patient has benefited as maximally as any patient I have ever seen from hyperbaric oxygen therapy. Most recently she had  exposed bone on the lateral part of her left foot after  a surgical procedure and that actually has closed. She has eschared areas in both heels but no open area. She is remained systemically well. I am not optimistic that anything can be done about this but the patient is very clear that she wants an attempt. The attempt would include a wound VAC further debridements and hyperbaric oxygen along with IV antibiotics. 6/26; I put her in for a trial of hyperbaric oxygen only because of the dramatic response she has had with wounds on her left midfoot earlier this year which was a surgical wound that went straight to her bone over the metatarsal heads and also remotely the left third toe. We will see if we can get this through our review process and insurance. She arrives in clinic with again purulent material pouring out of necrotic bone on the top of the foot distally. There is also some concerning erythema on the front of the leg that we marked. It is bit difficult to tell how tender this is because of neuropathy. I note from infectious disease that she had her vancomycin extended. 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 She had the wound VAC on for part of the week. The rest of the time she is putting various things on this including Medihoney, "ionized water" silver sorb gel etc. 7/7; follow-up along with HBO. She is still on vancomycin at dialysis. She has a large open area on the dorsal right foot and a small dark eschar area on her heel. There is a lot less erythema in the area and a lot less tenderness. From an infection point of view I think this is better. She still has a lot of necrosis in the remaining right forefoot [previous TMA] we are still using the wound VAC in this area 7/16; follow-up along with HBO. I put her on linezolid after she finished her vancomycin. We started this last Friday I gave her 2 weeks worth. I had the expectation that she would be operatively debrided by Dr. Marla Roe but that still has  not happened yet. Patient phoned the office this week. She arrives for review today after HBO. The distal part of this wound is completely necrotic. Nonviable pieces of tendon bone was still purulent drainage. Also concerning that she has black eschar over the heel that is expanding. I think this may be indicative of infection in this area as well. She has less erythema and warmth in the ankle and calf but still an abnormal exam 7/21 follow-up along with HBO. I will renew her linezolid after checking a CBC with differential monitoring her blood counts especially her platelets. She was supposed to have surgery yesterday but if I am reading things correctly this was canceled after her blood sugar was found to be over 500. I thought Dr. Marla Roe who called me said that they were sending her to the ER but the patient states that was not the case. 7/28. Follow-up along with HBO. She is on linezolid I still do not have any lab work from dialysis even though I called last week. The patient is concerned about an area on her left lateral foot about the level of the base of her fifth metatarsal. I did not really see anything that ominous here however this patient is in [place] ability to point out problems that she is sensing and she has been accurate in the past Finally she received a  call from Dr. Marla Roe who is referring her to another orthopedic surgeon stating that she is too booked up to take her to the operating room now. Was still using a wound VAC on the foot 8/3 -Follow-up after HBO, she is got another week of linezolid, she is to call [facility] for an appointment, x-rays of both feet were reviewed, the left foot x-ray with third MTP joint osteo- Right foot x-ray widespread osteo-in the right midfoot Right ankle x-ray does not show any active evidence of infection 8/11-Patient is seen after HBO, the wounds on the right foot appear to be about the same, the heel wound had some necrotic base over tendon  that was debrided with a curette 8/21; patient is seen after HBO. The patient's wound on her dorsal foot actually looks reasonably good and there is substantial amount of epithelialization however the open area distally still has a lot of necrotic debris partially bone. I cannot really get a good sense of just how deep this probes under the foot. She has been pressuring me this week to order medical maggots through a company in [place] for her. The problem I have is there is not a defined wound area here. On the positive side there is no purulence. She has been to see infectious disease she is still on Septra DS although I have not had a chance to review their notes 8/28; patient is seen in conjunction with HBO. The wounds on her foot continued to improve including the right dorsal foot substantially the, the distal part of this wound and the area on the right heel. We have been using a wound VAC over this chronically. She is still on trimethoprim as directed by infectious disease 9/4; patient is seen in conjunction with HBO. Right dorsal foot wound substantially anteriorly is better however she continues to have a deep wound in the distal part of this that is not responding. We have been using silver collagen under border foam ooArea on the right plantar medial heel seems better. We have been using Hydrofera Blue 12/12/18 on evaluation today patient appears to be doing about the same with regard to her wound based on prior measurements. She does have some necrotic tissue noted on the lateral aspect of the wound that is going require a little bit of sharp debridement today. This includes what appears to be potentially either severely necrotic bone or tendon. Nonetheless other than that she does not appear to have any severe infection which is good news 9/18; it is been 2 weeks since I saw this wound. She is tolerating HBO well. Continued dramatic improvement in the area on the right dorsal foot.  She still has a small wound on the heel that we have been using Hydrofera Blue. She continues with a wound VAC 9/24; patient has to be seen emergently today with a swelling on her right lateral lower leg. She says that she told Dr. Evette Doffing about this and also myself on a couple of occasions but I really have no recollection of this. She is not systemically unwell and her wound really looked good the last time I saw this. She showed this to providers at dialysis and she was able to verify that she was started on cephalexin today for 5 doses at dialysis. She dialyzes on Tuesday Thursday and Saturday. 10/2; patient is seen in conjunction with HBO. The area that is draining on the right anterior medial tibia is more extensive. Copious amounts of serosanguineous drainage with some purulence.  We are still using the wound VAC on the original wound then it is stable. Culture I did of the original IandD showed MRSA I contacted dialysis she is now on vancomycin with dialysis treatments. I asked them to run a month 10/9; patient seen in conjunction with HBO. She had a new spontaneous open area just above the wound on the right medial tibia ankle. More swelling on the right medial tibia. Her wound on the foot looks about the same perhaps slightly better. There is no warmth spreading up her leg but no obvious erythema. her MRI of the foot and ankle and distal tib-fib is not booked for next Friday I discussed this with her in great detail over multiple days. it is likely she has spreading infection upper leg at least involving the distal 25% above the ankle. She knows that if I refer her to orthopedics for infectious disease they are going to recommend amputation and indeed I am not against this myself. We had a good trial at trying to heal the foot which is what she wanted along with antibiotics debridement and HBO however she clearly has spreading infection [probably staph aureus/MRSA]. Nevertheless she once  again tells me she wants to wait the left of the MRI. She still makes comments about having her amputation done in [place]. 10/19; arrives today with significant swelling on the lateral right leg. Last culture I did showed Klebsiella. Multidrug-resistant. Cipro was intermediate sensitivity and that is what I have her on pending her MRI which apparently is going to be done on Thursday this week although this seems to be moving back and forth. She is not systemically unwell. We are using silver alginate on her major wound area on the right medial foot and the draining areas on the right lateral lower leg 10/26; MRI showed extensive abscess in the anterior compartment of the right leg also widespread osteomyelitis involving osseous structures of the midfoot and portions of the hindfoot. Also suspicion for osteomyelitis anterior aspect of the distal medial malleolus. Culture I did of the purulence once again showed a multidrug-resistant Klebsiella. I have been in contact with nephrology late last week and she has been started on cefepime at dialysis to replace the vancomycin We sent a copy of her MRI report to Dr. Geroge Baseman in [place] who is an orthopedic surgeon. The patient takes great stock in his opinion on this. She says she will go to [place] to have her leg amputated if Dr. Geroge Baseman does not feel there is any salvage options. 11/2; she still is not talk to her orthopedic surgeon in [place]. Apparently he will call her at [phone] this afternoon. The quality of this is she has not allowed me to refer her anywhere. She has been told over and over that she needs this amputated but has not agreed to be referred. She tells me her blood sugar was 600 last night but she has not been febrile. 11/9; she never did got a call from the orthopedic surgeon in [place] therefore that is off the radar. We have arranged to get her see orthopedic surgery at [facility]. She still has a lot of draining  purulence coming out of the new abscess in her right leg although that probably came from the osteomyelitis in her right foot and heel. Meanwhile the original wound on the right foot looks very healthy. Continued improvement. The issue is that the last MRI showed osteomyelitis in her right foot extensively she now has an abscess in the right  anterior lower leg. There is nobody in [place] who will offer this woman anything but an amputation and to be honest that is probably what she needs. I think she still wants to talk about limb salvage although at this point I just do not see that. She has completed her vancomycin at dialysis which was for the original staph aureus she is still on cefepime for the more recent Klebsiella. She has had a long course of both of these antibiotics which should have benefited the osteomyelitis on the right foot as well as the abscess. 11/16; apparently [place] elective surgery is shut down because of COVID-19 pandemic. I have reached out to some contacts at [facility] to see if we can get her an orthopedic appointment there. I am concerned about continually leaving this but for the moment everything is static. In fact her original large wound on this foot is closing down. It is the abscess on the right anterior leg that continues to drain purulent serosanguineous material. She is not currently on any antibiotics however she had a prolonged course of vancomycin [1 month] as well as cefepime for a month 02/24/2019 on evaluation today patient appears to be doing better than the last time I saw her. This is not a patient that I typically see. With that being said I am covering for Dr. Dellia Nims this week and again compared to when I last saw her overall the wounds in particular seem to be doing significantly better which is good news. With that being said the patient tells me several disconcerting things. She has not been able to get in to see anyone for potential debridement  in regard to her leg wounds although she tells me that she does not think it is necessary any longer because she is taking care of that herself. She noticed a string coming out of the lower wound on her leg over the last week. The patient states that she subsequently decided that we must of pack something in there and started pulling the string out and as it kept coming and coming she realized this was likely her tendon. With that being said she continued to remove as much of this as she could. She then I subsequently proceeded to using tubes of antibiotic ointment which she will stick down into the wound and then scored as much as she can until she sees it coming out of the other wound opening. She states that in doing this she is actually made things better and there is less redness and irritation. With regard to her foot wound she does have some necrotic tendon and tissue noted in one small corner but again the actual wound itself seems to be doing better with good granulation in general compared to my last evaluation. 12/7; continued improvement in the wound on the substantial part of the right medial foot. Still a necrotic area inferiorly that required debridement but the rest of this looks very healthy and is contracting. She has 2 wounds on the right lateral leg which were her original drainage sites from her abscess but all of this looks a lot better as well. She has been using silver alginate after putting antibiotic biotic ointment in one wound and watching it come out the other. I have talked to her in some detail today. I had given her names of orthopedic surgeons at [facility] for second opinion on what to do about the right leg. I do not think the patient never called them. She has not  been able to get a hold of the orthopedic surgeon in [place] that she had put a lot of faith in as being somebody would give her an opinion that she would trust. I talked to her today and said even if I  could get her in to another orthopedic surgeon about the leg which she accept an amputation and she said she would not therefore I am not going to press this issue for the moment 12/14; continued improvement in his substantial wound on the right medial foot. There is still a necrotic area inferiorly with tightly adherent necrotic debris which I have been working on debriding each time she is here. She does not have an orthopedic appointment. Since last time she was here I looked over her cultures which were essentially MRSA on the foot wound and gram-negative rods in the abscess on the anterior leg. 12/21; continued improvement in the area on the right medial foot. She is not up on this much and that is probably a good thing since I do not know it could support continuous ambulation. She has a small area on the right lateral leg which were remanence of the IandD's I did because of the abscess. I think she should probably have prophylactic antibiotics I am going to have to look this over to see if we can make an intelligent decision here. In the meantime her major wound is come down nicely. Necrotic area inferiorly is still there but looks a lot better 04/06/2019; she has had some improvement in the overall surface area on the right medial foot somewhat narrowedr both but somewhat longer. The areas on the right lateral leg which were initial IandD sites are superficial. Nothing is present on the right heel. We are using silver alginate to the wound areas 1/18; right medial foot somewhat smaller. Still a deep probing area in the most distal recess of the wound. She has nothing open on the right leg. She has a new wound on the plantar aspect of her left fourth toe which may have come from just pulling skin. The patient using Medihoney on the wound on her foot under silver alginate. I cannot discourage her from this 2/1; 2-week follow-up using silver alginate on the right foot and her left fourth toe. The  area on the right dorsal foot is contracted although there is still the deep area in the most distal part of the wound but still has some probing depth. No overt infection 2/15; 2-week follow-up. She continues to have improvement in the surface area on the dorsal right foot. Even the tunneling area from last time is almost closed. The area that was on the plantar part of her left fourth toe over the PIP is indeed closed 3/1; 2-week follow-up. Continued improvement in surface area. The original divot that we have been debriding inferiorly I think has full epithelialization although the epithelialization is gone down into the wound with probably 4 mm of depth. Even under intense illumination I am unable to see anything open here. The remanence of the wound in this area actually look quite healthy. We have been using silver alginate 3/15; 2-week follow-up. Unfortunately not as good today. She has a comma shaped wound on the dorsal foot however the upper part of this is larger. Under illumination debris on the surface She also tells [place] that she was on her right leg 2 times in the last couple of weeks mostly to reach up for things above her head etc. She felt  a sharp pain in the right leg which she thinks is somewhere from the ankle to the knee. The patient has neuropathy and is really uncertain. She cannot feel her foot so she does not think it was coming from there 3/29; 2-week follow-up. Her wound measures smaller. Surface of the wound appears reasonable. She is using silver alginate with underlying Medihoney. She has home health. X-rays I did of her tib-fib last time were negative although it did show arterial calcification 4/12; 2-week follow-up. Her wound measures smaller in length. Using manuka honey with silver alginate on top. She has home health. 4/26; 2-week follow-up. Her wound is smaller but still very adherent debris under illumination requiring debridement she has been using manuka honey  with silver alginate. 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 Been using Hydrofera Blue. She is on suppressive Bactrim but apparently she has been taking it twice daily 6/7; I have not seen this wound and about 6 weeks. Since then she was up in [place]. By her own admission she was walking on the foot because she did not have a wheelchair. The wound is not nearly as healthy looking as it was the last time I saw this. We ordered different things for her but she only uses Medihoney and silver alginate. As far as I know she is on suppressive trimethoprim sulfamethoxazole. She does not admit to any fever or chills. Her CBGs apparently are at baseline however she is saying that she feels some discomfort on the lateral part of her ankle I looked over her last inflammatory markers from the summer 2020 at which time she had a deeply necrotic infected wound in this area. On 11/10/2018 her sedimentation rate was 56 and C-reactive protein 9.9. This was 107 and 29 on 07/29/2018. 6/17; the patient had a necrotic wound the last time she was here on the right dorsal foot. After debridement I did a culture. This showed a very resistant ESBL Klebsiella as well as Enterococcus. Her x-ray of the foot which was done because of warmth and some discomfort showed bone destruction within the carpal bones involving the navicular acute cuboid lateral middle cuneiforms but essentially unchanged from her prior study which was done on 10/29/2018. The findings were felt to represent chronic osteomyelitis. We did inflammatory markers on her. Her white count was 5.25 sedimentation rate 16 and C-reactive protein at 11.1. Notable for the fact that in August 2020 her CRP was 9.9 and sedimentation rate 56. I have looked at her x-rays. It is true that the bone destruction is very impressive however the patient came into this clinic  for the wound on her right foot with pieces of bone literally falling out anteriorly with purulent material. I am not exactly sure I could have expected anything different. She has not been systemically unwell no fever chills or blood sugars have been reasonable. 6/28; she arrives with a right heel closed. The substantial area on the right anterior foot looks healthy. Much better looking surface. I think we can change to [facility] seems to help this previously. She is getting her antibiotics at dialysis she should be just about finished 7/9; changed to [facility] last week. Surface wound looks satisfactory not much change in surface area however. She is going to [place] state next week this is usually a difficult thing for this patient follow-up will be for 2  weeks. 7/23; using Hydrofera Blue. She returns from her trip and the wound looks surprisingly good. Usually when this patient goes on trips she comes back with a lot of problems with the wound. She is saying that she sometimes feels an episodic "crunching" feeling on the lateral part of the foot. She is neuropathic and not feeling pain but wonders whether this could be a neuropathic dysesthesia. 11/13/19-Patient returns after 3 weeks, the wound itself is stable and patient states that there is nothing new going on she is on some extra anxiety medications and is resisting the temptation to pick at the dry skin around the wound. Objective Constitutional alert and oriented x 3. sitting or standing blood pressure is within target range for patient.. supine blood pressure is within target range for patient.. pulse regular and within target range for patient.Marland Kitchen respirations regular, non-labored and within target range for patient.Marland Kitchen temperature within target range for patient.. Well- nourished and well-hydrated in no acute distress. Vitals Time Taken: 1:14 PM, Height: 67 in, Weight: 125 lbs, BMI: 19.6, Temperature: 98.7 F, Pulse: 98 bpm,  Respiratory Rate: 18 breaths/min, Blood Pressure: 149/74 mmHg. General Notes: Right transmetatarsal amputation site wound with previous osteomyelitis, looking stable, surrounding skin is dry but otherwise intact Integumentary (Hair, Skin) Wound #43 status is Open. Original cause of wound was Gradually Appeared. The wound is located on the Right,Medial Foot. The wound measures 3.1cm length x 3.8cm width x 0.1cm depth; 9.252cm^2 area and 0.925cm^3 volume. There is Fat Layer (Subcutaneous Tissue) Exposed exposed. There is no tunneling or undermining noted. There is a medium amount of serosanguineous drainage noted. The wound margin is flat and intact. There is large (67-100%) pink granulation within the wound bed. There is a small (1-33%) amount of necrotic tissue within the wound bed including Adherent Slough. 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 Dressing Change Frequency: Wound #43 Right,Medial Foot: Change Dressing every other day. Wound Cleansing: Clean wound with Wound Cleanser - or normal saline Primary Wound Dressing: Wound #43 Right,Medial Foot: Hydrofera Blue - Classic Secondary Dressing: Wound #43 Right,Medial Foot: Kerlix/Rolled Gauze Dry Gauze Edema Control: Elevate legs to the level of the heart or above for 30 minutes daily and/or when sitting, a frequency of: - throughout the day Home Health: [facility] for wound care. -  Interim Electronic Signature(s) Signed: 11/13/2019 1:43:12 PM By: Tobi Bastos MD, MBA Entered By: Tobi Bastos on 11/13/2019 13:43:12 -------------------------------------------------------------------------------- SuperBill Details Patient Name: Date of Service: [place] L[place] [name] 11/13/2019 Medical Record Number: [id number] Patient Account Number: [id number] Date of Birth/Sex: Treating RN: [birth year] (49 y.o. F) Dwiggins, Shannon Primary Care Provider: [name], [name] Other Clinician: Referring Provider: Treating Provider/Extender: Tobi Bastos MO REIRA, [name] Weeks in Treatment: 64 Diagnosis Coding ICD-10 Codes Code Description [phone] Other chronic osteomyelitis, right ankle and foot L97.514 Non-pressure chronic ulcer of other part of right foot with necrosis of bone L97.411 Non-pressure chronic ulcer of right heel and midfoot limited to breakdown of skin E10.621 Type  1 diabetes mellitus with foot ulcer Facility Procedures CPT4 Code: [id number] Description: 68864 - WOUND CARE VISIT-LEV 3 EST PT Modifier: Quantity: 1 Physician Procedures : CPT4 Code Description Modifier [id number] - WC PHYS LEVEL 3 - EST PT ICD-10 Diagnosis Description M86.671 Other chronic osteomyelitis, right ankle and foot Quantity: 1 Electronic Signature(s) Signed: 11/13/2019 4:53:28 PM By: [name] Signed: 11/13/2019 4:53:36 PM By: Tobi Bastos MD, MBA Previous Signature: 11/13/2019 1:43:33 PM Version By: Tobi Bastos MD, MBA Entered By: [name] on 11/13/2019 13:50:48

## 2019-11-16 NOTE — Progress Notes (Signed)
[name], [name] ([id number]) Visit Report for 11/13/2019 Arrival Information Details Patient Name: Date of Service: [name] [name] 11/13/2019 1:15 PM Medical Record Number: [id number] Patient Account Number: [id number] Date of Birth/Sex: Treating RN: 1970/11/26 (49 [name]) Dwiggins, Shannon Primary Care Jarel Cuadra: [name], [name] Other Clinician: Referring Santino Kinsella: Treating Tori Cupps/Extender: Tobi Bastos MO REIRA, [name] Weeks in Treatment: 100 Visit Information History Since Last Visit Added or deleted any medications: No Patient Arrived: Wheel Chair Any new allergies or adverse reactions: No Arrival Time: 13:14 Had a fall or experienced change in No Accompanied By: self activities of daily living that may affect Transfer Assistance: None risk of falls: Patient Identification Verified: Yes Signs or symptoms of abuse/neglect since last visito No Secondary Verification Process Completed: Yes Hospitalized since last visit: No Patient Requires Transmission-Based Precautions: No Implantable device outside of the clinic excluding No Patient Has Alerts: No cellular tissue based products placed in the center since last visit: Has Dressing in Place as Prescribed: Yes Pain Present Now: No Electronic Signature(s) Signed: 11/16/2019 9:11:13 AM By: Sandre Kitty Entered By: Sandre Kitty on 11/13/2019 13:14:48 -------------------------------------------------------------------------------- Clinic Level of Care Assessment Details Patient Name: Date of Service: Nancy [name] 11/13/2019 1:15 PM Medical Record Number: [id number] Patient Account Number: [id number] Date of Birth/Sex: Treating RN: [birth year] (42 [name]) Dwiggins, Shannon Primary Care [place] Ice: [name], [name] Other Clinician: Referring Ferdie Bakken: Treating Beautiful Pensyl/Extender: Tobi Bastos MO REIRA, [name] Weeks in Treatment: 78 Clinic Level of Care Assessment Items TOOL 4 Quantity Score X-  1 0 Use when only an EandM is performed on FOLLOW-UP visit ASSESSMENTS - Nursing Assessment / Reassessment X- 1 10 Reassessment of Co-morbidities (includes updates in patient status) X- 1 5 Reassessment of Adherence to Treatment Plan ASSESSMENTS - Wound and Skin A ssessment / Reassessment X - Simple Wound Assessment / Reassessment - one wound 1 5 []  - 0 Complex Wound Assessment / Reassessment - multiple wounds []  - 0 Dermatologic / Skin Assessment (not related to wound area) ASSESSMENTS - Focused Assessment X- 1 5 Circumferential Edema Measurements - multi extremities []  - 0 Nutritional Assessment / Counseling / Intervention []  - 0 Lower Extremity Assessment (monofilament, tuning fork, pulses) []  - 0 Peripheral Arterial Disease Assessment (using hand held doppler) ASSESSMENTS - Ostomy and/or Continence Assessment and Care []  - 0 Incontinence Assessment and Management []  - 0 Ostomy Care Assessment and Management (repouching, etc.) PROCESS - Coordination of Care X - Simple Patient / Family Education for ongoing care 1 15 []  - 0 Complex (extensive) Patient / Family Education for ongoing care X- 1 10 Staff obtains Programmer, systems, Records, T Results / Process Orders est X- 1 10 Staff telephones HHA, Nursing Homes / Clarify orders / etc []  - 0 Routine Transfer to another Facility (non-emergent condition) []  - 0 Routine Hospital Admission (non-emergent condition) []  - 0 New Admissions / Biomedical engineer / Ordering NPWT Apligraf, etc. , []  - 0 Emergency Hospital Admission (emergent condition) X- 1 10 Simple Discharge Coordination []  - 0 Complex (extensive) Discharge Coordination PROCESS - Special Needs []  - 0 Pediatric / Minor Patient Management []  - 0 Isolation Patient Management []  - 0 Hearing / Language / Visual special needs []  - 0 Assessment of Community assistance (transportation, D/C planning, etc.) []  - 0 Additional assistance / Altered mentation []  -  0 Support Surface(s) Assessment (bed, cushion, seat, etc.) INTERVENTIONS - Wound Cleansing / Measurement X - Simple Wound Cleansing -  one wound 1 5 []  - 0 Complex Wound Cleansing - multiple wounds X- 1 5 Wound Imaging (photographs - any number of wounds) []  - 0 Wound Tracing (instead of photographs) X- 1 5 Simple Wound Measurement - one wound []  - 0 Complex Wound Measurement - multiple wounds INTERVENTIONS - Wound Dressings X - Small Wound Dressing one or multiple wounds 1 10 []  - 0 Medium Wound Dressing one or multiple wounds []  - 0 Large Wound Dressing one or multiple wounds X- 1 5 Application of Medications - topical []  - 0 Application of Medications - injection INTERVENTIONS - Miscellaneous []  - 0 External ear exam []  - 0 Specimen Collection (cultures, biopsies, blood, body fluids, etc.) []  - 0 Specimen(s) / Culture(s) sent or taken to Lab for analysis []  - 0 Patient Transfer (multiple staff / Civil Service fast streamer / Similar devices) []  - 0 Simple Staple / Suture removal (25 or less) []  - 0 Complex Staple / Suture removal (26 or more) []  - 0 Hypo / Hyperglycemic Management (close monitor of Blood Glucose) []  - 0 Ankle / Brachial Index (ABI) - do not check if billed separately X- 1 5 Vital Signs Has the patient been seen at the hospital within the last three years: Yes Total Score: 105 Level Of Care: New/Established - Level 3 Electronic Signature(s) Signed: 11/13/2019 4:53:28 PM By: [name] Entered By: [name] on 11/13/2019 13:50:31 -------------------------------------------------------------------------------- Encounter Discharge Information Details Patient Name: Date of Service: [name] [address] Nancy Port [name] 11/13/2019 1:15 PM Medical Record Number: [id number] Patient Account Number: [id number] Date of Birth/Sex: Treating RN: 01-Dec-1970 (26 [name].o. Nancy Fetter Primary Care Burdette Gergely: [name], [name] Other Clinician: Referring Jerrico Covello: Treating  Adahlia Stembridge/Extender: Tobi Bastos MO REIRA, [name] Weeks in Treatment: 21 Encounter Discharge Information Items Discharge Condition: Stable Ambulatory Status: Wheelchair Discharge Destination: Home Transportation: Private Auto Accompanied By: alone Schedule Follow-up Appointment: Yes Clinical Summary of Care: Patient Declined Electronic Signature(s) Signed: 11/13/2019 5:19:54 PM By: [name] RN, BSN Entered By: [name] on 11/13/2019 16:11:10 -------------------------------------------------------------------------------- Lower Extremity Assessment Details Patient Name: Date of Service: [name] [name] 11/13/2019 1:15 PM Medical Record Number: [id number] Patient Account Number: [id number] Date of Birth/Sex: Treating RN: 05-19-1970 (64 [name].o. Nancy Fetter Primary Care Chord Takahashi: [name], [name] Other Clinician: Referring Robert Sunga: Treating Flay Ghosh/Extender: Tobi Bastos MO REIRA, [name] Weeks in Treatment: 93 Edema Assessment Assessed: [Left: No] [Right: No] Edema: [Left: N] [Right: o] Calf Left: Right: Point of Measurement: cm From Medial Instep cm 27 cm Ankle Left: Right: Point of Measurement: cm From Medial Instep cm 18.2 cm Vascular Assessment Pulses: Dorsalis Pedis Palpable: [Right:Yes] Electronic Signature(s) Signed: 11/13/2019 5:19:54 PM By: [name] RN, BSN Entered By: [name] on 11/13/2019 13:27:39 -------------------------------------------------------------------------------- Multi-Disciplinary Care Plan Details Patient Name: Date of Service: [name] [name] 11/13/2019 1:15 PM Medical Record Number: [id number] Patient Account Number: [id number] Date of Birth/Sex: Treating RN: 1971/03/22 (39 [name]) [name] Primary Care Keymora Grillot: [name], [name] Other Clinician: Referring Orvie Caradine: Treating Kenshin Splawn/Extender: Tobi Bastos MO REIRA, [name] Weeks in Treatment: 33 Active Inactive Wound/Skin  Impairment Nursing Diagnoses: Impaired tissue integrity Knowledge deficit related to ulceration/compromised skin integrity Goals: Patient/caregiver will verbalize understanding of skin care regimen Date Initiated: 09/03/2018 Target Resolution Date: 11/27/2019 Goal Status: Active Ulcer/skin breakdown will have a volume reduction of 30% by week 4 Date Initiated: 09/03/2018 Date Inactivated: 10/07/2018 Target Resolution Date: 10/01/2018 Goal Status: Unmet Unmet  Reason: COMORBITIES Ulcer/skin breakdown will have a volume reduction of 50% by week 8 Date Initiated: 10/07/2018 Date Inactivated: 11/03/2018 Target Resolution Date: 10/31/2018 Goal Status: Unmet Unmet Reason: Osteomyelitis Interventions: Assess patient/caregiver ability to obtain necessary supplies Assess patient/caregiver ability to perform ulcer/skin care regimen upon admission and as needed Assess ulceration(s) every visit Provide education on ulcer and skin care Treatment Activities: Skin care regimen initiated : 09/03/2018 Topical wound management initiated : 09/03/2018 Notes: Electronic Signature(s) Signed: 11/13/2019 4:53:28 PM By: [name] Entered By: [name] on 11/13/2019 13:49:29 -------------------------------------------------------------------------------- Pain Assessment Details Patient Name: Date of Service: Nancy New [name] 11/13/2019 1:15 PM Medical Record Number: [id number] Patient Account Number: [id number] Date of Birth/Sex: Treating RN: February 20, 1971 (73 [name]) [name] Primary Care Silveria Botz: [name], [name] Other Clinician: Referring Casimer Russett: Treating Kristyn Obyrne/Extender: Tobi Bastos MO REIRA, [name] Weeks in Treatment: 27 Active Problems Location of Pain Severity and Description of Pain Patient Has Paino No Site Locations Pain Management and Medication Current Pain Management: Electronic Signature(s) Signed: 11/13/2019 4:53:28 PM By: [name] Signed:  11/16/2019 9:11:13 AM By: Sandre Kitty Entered By: Sandre Kitty on 11/13/2019 13:15:18 -------------------------------------------------------------------------------- Patient/Caregiver Education Details Patient Name: Date of Service: [name] [name], [name] 8/13/2021andnbsp1:15 PM Medical Record Number: [id number] Patient Account Number: [id number] Date of Birth/Gender: Treating RN: [birth year] (39 [name].o. [name] Primary Care Physician: [name], [name] Other Clinician: Referring Physician: Treating Physician/Extender: Tobi Bastos MO REIRA, [name] Weeks in Treatment: 96 Education Assessment Education Provided To: Patient Education Topics Provided Wound/Skin Impairment: Handouts: Caring for Your Ulcer Methods: Explain/Verbal Responses: State content correctly Electronic Signature(s) Signed: 11/13/2019 4:53:28 PM By: [name] Entered By: [name] on 11/13/2019 13:49:46 -------------------------------------------------------------------------------- Wound Assessment Details Patient Name: Date of Service: Nancy New [name] 11/13/2019 1:15 PM Medical Record Number: [id number] Patient Account Number: [id number] Date of Birth/Sex: Treating RN: February 01, 1971 (34 [name]) Dwiggins, Shannon Primary Care Ceria Suminski: [name], [name] Other Clinician: Referring Quentin Strebel: Treating Katlyn Muldrew/Extender: Tobi Bastos MO REIRA, [name] Weeks in Treatment: 10 Wound Status Wound Number: 43 Primary Diabetic Wound/Ulcer of the Lower Extremity Etiology: Wound Location: Right, Medial Foot Wound Open Wounding Event: Gradually Appeared Status: Date Acquired: 08/04/2018 Comorbid Cataracts, Chronic sinus problems/congestion, Anemia, Sleep Weeks Of Treatment: 62 History: Apnea, Deep Vein Thrombosis, Hypertension, Peripheral Arterial Clustered Wound: Yes Disease, Type I Diabetes, Osteoarthritis, Osteomyelitis, Neuropathy, Seizure Disorder Wound  Measurements Length: (cm) 3.1 Width: (cm) 3.8 Depth: (cm) 0.1 Area: (cm) 9.252 Volume: (cm) 0.925 % Reduction in Area: 87.3% % Reduction in Volume: 95.8% Epithelialization: Small (1-33%) Tunneling: No Undermining: No Wound Description Classification: Grade 4 Wound Margin: Flat and Intact Exudate Amount: Medium Exudate Type: Serosanguineous Exudate Color: red, brown Foul Odor After Cleansing: No Slough/Fibrino Yes Wound Bed Granulation Amount: Large (67-100%) Exposed Structure Granulation Quality: Pink Fascia Exposed: No Necrotic Amount: Small (1-33%) Fat Layer (Subcutaneous Tissue) Exposed: Yes Necrotic Quality: Adherent Slough Tendon Exposed: No Muscle Exposed: No Joint Exposed: No Bone Exposed: No Treatment Notes Wound #43 (Right, Medial Foot) 1. Cleanse With Wound Cleanser 3. Primary Dressing Applied Hydrofera Blue 4. Secondary Dressing Dry Gauze Roll Gauze 5. Secured With Recruitment consultant) Signed: 11/13/2019 4:53:28 PM By: [name] Signed: 11/13/2019 5:19:54 PM By: [name] RN, BSN Entered By: [name] on 11/13/2019 13:28:43 -------------------------------------------------------------------------------- Vitals Details Patient Name: Date of Service: [name] Nancy Nancy Port [name] 11/13/2019 1:15 PM Medical Record Number: [id number] Patient Account Number:  [id number] Date of Birth/Sex: Treating RN: 1971/01/22 (46 [name]) Dwiggins, Shannon Primary Care Tysheena Ginzburg: [name], [name] Other Clinician: Referring Whisper Kurka: Treating Andrianna Manalang/Extender: Tobi Bastos MO REIRA, [name] Weeks in Treatment: 8 Vital Signs Time Taken: 13:14 Temperature (F): 98.7 Height (in): 67 Pulse (bpm): 98 Weight (lbs): 125 Respiratory Rate (breaths/min): 18 Body Mass Index (BMI): 19.6 Blood Pressure (mmHg): 149/74 Reference Range: 80 - 120 mg / dl Electronic Signature(s) Signed: 11/16/2019 9:11:13 AM By: Sandre Kitty Entered By: Sandre Kitty on 11/13/2019 13:15:11

## 2019-11-24 DIAGNOSIS — Z4802 Encounter for removal of sutures: Secondary | ICD-10-CM | POA: Insufficient documentation

## 2019-11-26 DIAGNOSIS — T782XXA Anaphylactic shock, unspecified, initial encounter: Secondary | ICD-10-CM | POA: Insufficient documentation

## 2019-11-26 DIAGNOSIS — T7840XA Allergy, unspecified, initial encounter: Secondary | ICD-10-CM | POA: Insufficient documentation

## 2019-12-04 ENCOUNTER — Encounter (HOSPITAL_BASED_OUTPATIENT_CLINIC_OR_DEPARTMENT_OTHER): Payer: Medicare HMO | Admitting: Internal Medicine

## 2019-12-21 ENCOUNTER — Other Ambulatory Visit: Payer: Self-pay

## 2019-12-21 ENCOUNTER — Encounter (HOSPITAL_BASED_OUTPATIENT_CLINIC_OR_DEPARTMENT_OTHER): Payer: Medicare HMO | Attending: Internal Medicine | Admitting: Internal Medicine

## 2019-12-21 DIAGNOSIS — E10610 Type 1 diabetes mellitus with diabetic neuropathic arthropathy: Secondary | ICD-10-CM | POA: Insufficient documentation

## 2019-12-21 DIAGNOSIS — E10621 Type 1 diabetes mellitus with foot ulcer: Secondary | ICD-10-CM | POA: Insufficient documentation

## 2019-12-21 DIAGNOSIS — L97514 Non-pressure chronic ulcer of other part of right foot with necrosis of bone: Secondary | ICD-10-CM | POA: Diagnosis not present

## 2019-12-21 DIAGNOSIS — Z86718 Personal history of other venous thrombosis and embolism: Secondary | ICD-10-CM | POA: Insufficient documentation

## 2019-12-21 DIAGNOSIS — L97411 Non-pressure chronic ulcer of right heel and midfoot limited to breakdown of skin: Secondary | ICD-10-CM | POA: Insufficient documentation

## 2019-12-21 DIAGNOSIS — Z9641 Presence of insulin pump (external) (internal): Secondary | ICD-10-CM | POA: Diagnosis not present

## 2019-12-21 DIAGNOSIS — E1051 Type 1 diabetes mellitus with diabetic peripheral angiopathy without gangrene: Secondary | ICD-10-CM | POA: Insufficient documentation

## 2019-12-21 DIAGNOSIS — M86671 Other chronic osteomyelitis, right ankle and foot: Secondary | ICD-10-CM | POA: Diagnosis not present

## 2019-12-21 DIAGNOSIS — E1069 Type 1 diabetes mellitus with other specified complication: Secondary | ICD-10-CM | POA: Diagnosis not present

## 2019-12-21 DIAGNOSIS — Z794 Long term (current) use of insulin: Secondary | ICD-10-CM | POA: Insufficient documentation

## 2019-12-21 DIAGNOSIS — Z89431 Acquired absence of right foot: Secondary | ICD-10-CM | POA: Diagnosis not present

## 2019-12-22 NOTE — Progress Notes (Signed)
VERNON, ARIEL ([id number]) Visit Report for 12/21/2019 Debridement Details Patient Name: Date of Service: [place] LLO Peggye Fothergill 12/21/2019 12:45 PM Medical Record Number: [id number] Patient Account Number: [id number] Date of Birth/Sex: Treating RN: 02-05-1971 (49 y.o. Nancy Fetter Primary Care Provider: [name], [name] Other Clinician: Referring Provider: Treating Provider/Extender: [name], [name] Weeks in Treatment: 67 Debridement Performed for Assessment: Wound #43 Right,Medial Foot Performed By: Physician Ricard Dillon., MD Debridement Type: Debridement Severity of Tissue Pre Debridement: Fat layer exposed Level of Consciousness (Pre-procedure): Awake and Alert Pre-procedure Verification/Time Out Yes - 13:50 Taken: Start Time: 13:50 T Area Debrided (L x W): otal 4.8 (cm) x 2.9 (cm) = 13.92 (cm) Tissue and other material debrided: Viable, Non-Viable, Slough, Subcutaneous, Slough Level: Skin/Subcutaneous Tissue Debridement Description: Excisional Instrument: Curette Bleeding: Minimum Hemostasis Achieved: Pressure End Time: 13:51 Procedural Pain: 0 Post Procedural Pain: 0 Response to Treatment: Procedure was tolerated well Level of Consciousness (Post- Awake and Alert procedure): Post Debridement Measurements of Total Wound Length: (cm) 4.8 Width: (cm) 2.9 Depth: (cm) 0.1 Volume: (cm) 1.093 Character of Wound/Ulcer Post Debridement: Improved Severity of Tissue Post Debridement: Fat layer exposed Post Procedure Diagnosis Same as Pre-procedure Electronic Signature(s) Signed: 12/21/2019 5:09:36 PM By: [name] RN, BSN Signed: 12/22/2019 4:19:57 PM By: Linton Ham MD Entered By: Linton Ham on 12/21/2019 14:06:57 -------------------------------------------------------------------------------- HPI Details Patient Name: Date of Service: [name] 12/21/2019 12:45 PM Medical Record Number: [id number] Patient  Account Number: [id number] Date of Birth/Sex: Treating RN: [birth year] (49 y.o. Nancy Fetter Primary Care Provider: [name], [name] Other Clinician: Referring Provider: Treating Provider/Extender: [name], [name] Weeks in Treatment: 18 History of Present Illness HPI Description: 49 year old diabetic who is known to have type 1 diabetes which is poorly controlled last hemoglobin A1c was 11%. She comes in with a ulcerated area on the left lateral foot which has been there for over 6 months. Was recently she has been treated by [name] of podiatry who saw her last on 05/28/2016. Review of his notes revealed that the patient had incision and drainage with placement of antibiotic beads to the left foot on 04/11/2016 for possible osteomyelitis of the cuboid bone. Over the last year she's had a history of amputation of the left fifth toe and a femoropopliteal popliteal bypass graft somewhere in April 2017. 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 His note [name] mentions that the patient has been referred to me for further wound care and possibly great candidate for hyperbaric oxygen therapy due to recurrent osteomyelitis. However we do not have any x-rays of biopsy reports confirming this. He has been on several antibiotics including Bactrim and most recently is on doxycycline for an MRSA. I understand, the patient was not a candidate for IV antibiotics as she has had previous PICC lines which resulted in blood clots in both arms. There was a x-ray report dated 04/04/2016 on [name] notes which showed evidence of fifth ray resection left foot with osteolytic changes noted to the fourth metatarsal and cuboid bone on the left. 06/13/2016 -- had a left foot x-ray which showed no acute fracture or dislocation and no definite radiographic evidence of osteomyelitis. Advanced osteopenia was seen. 06/20/2016 -- she has noticed a new wound on the right plantar foot  in the region where she had a callus before. 06/27/16- the patient did have her x-ray of the right  foot which showed no findings to suggest osteomyelitis. She saw her endocrinologist, [name], yesterday. Her A1c in January was 11. He also indicates mismanagement and noncompliance regarding her diabetes. She is currently on Bactrim for a lip infection. She is complaining of nausea, vomiting and diarrhea. She is unable to articulate the exact orders or dosing of the Bactrim; it is unclear when she will complete this. 07/04/2016 -- results from [facility] of ABIs with ankle waveforms were noted from 02/14/2016. The examination done on 06/27/2015 showed noncompressible ABIs with the right being 1.45 and the left being 1.33. The present examination showed a right ABI of 1.19 on the left of 1.33. The conclusion was that right normal ABI in the lower extremity at rest however compared to previous study which was noncompressible ABI may be falsely elevated side suggesting medial calcification. The left ABI suggested medial calcification. 08/01/2016 -- the patient had more redness and pain on her right foot and did not get to come to see as noted she see her PCP or go to the ER and decided to take some leftover metronidazole which she had at home. As usual, the patient does report she feels and is rather noncompliant. 08/08/2016 -- -- x-ray of the right foot -- FINDINGS:Transmetatarsal amputation is noted. No bony destruction is noted to suggest osteomyelitis. IMPRESSION: No evidence of osteomyelitis. Postsurgical changes are seen. MRI would be more sensitive for possible bony changes. Culture has grown Serratia Marcescens -- sensitive to Bactrim, ciprofloxacin, ceftazidime she was seen by [name] on 08/06/2016. He did not find any exposed bone, muscle, tendon, ligament or joint. There was no malodor and he did a excisional debridement in the office. ============ Old notes: 49 year old patient  who is known to the wound clinic for a while had been away from the wound clinic since 09/01/2014. Over the last several months she has been admitted to various hospitals including [facility]. She was treated for a right metatarsal osteomyelitis with a transmetatarsal amputation and this was done about 2 months ago. He has a small ulcerated area on the right heel and she continues to have an ulcerated area on the left plantar aspect of the foot. The patient was recently admitted to the [facility] hospital group between 7/12 and 10/18/2014. she was given 3 weeks of IV vancomycin and was to follow-up with her surgeons at [facility] and also took oral vancomycin for C. difficile colitis. 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 MRI of the left foot done on 10/14/2014 showed no abscess or osteomyelitis. 04/27/15; this is a patient we know from previous stays in the wound care center. She is a type I diabetic I am not sure of her control currently. Since the last time I saw her she is had a right transmetatarsal amputation and has no wounds on her right foot and has no open wounds. She is been followed at the wound care center at [facility] in [place]. She comes today with the desire to undergo hyperbaric treatment locally. Apparently one of her wound care providers in [place] has suggested hyperbarics. This is in response to an MRI from 04/18/15 that showed increased marrow signal and loss of the proximal fifth metatarsal cortex evidence of osteomyelitis with likely early osteomyelitis in the cuboid  bone as well. She has a large wound over the base of the fifth metatarsal. She also has a eschar over her the  tips of her toes on 1,3 and 5. She does not have peripheral pulses and apparently is going for an angiogram tomorrow which seems reasonable. After this she is going to infectious disease at [facility]. They have been using Medihoney to the large wound on the lateral aspect of the left foot to. The patient has known Charcot deformity from diabetic neuropathy. She also has known diabetic PAD. Surprisingly I can't see that she has had any recent antibiotics, the patient states the last antibiotic she had was at the end of November for 10 days. I think this was in response to culture that showed group G strep although I'm not exactly sure where the culture was from. She is also had arterial studies on 03/29/15. This showed a right ABI of 1.4 that was noncompressible. Her left ABI was 0.73. 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 It was not felt that arterial inflow was adequate for healing of a foot ulcer. Her Doppler waveforms looked monophasic ===== READMISSION 02/28/17; this is in an now 49 year old woman we've had at several different occasions in this clinic. 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 She has a remote ex-smoker. She was last seen in this clinic by Dr. Con Memos I think in May. More recently she is been followed by her podiatrist [name] an infectious disease Dr. Megan Salon. She has 2 open wounds the major one is over the right first metatarsal head she also has a wound on the left plantar foot. an MRI of the right foot on 01/01/17 showed a soft tissue ulcer along the plantar aspect of the first metatarsal base consistent with osteomyelitis of the first metatarsal stump. Dr. 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 According to the patient this is been open for slightly over a month. She has been on a combination of Cipro 500 twice a day, Zyvox 600  twice a day and Flagyl 500 3 times a day for over a month now as directed by Dr. Megan Salon. cultures of the right foot earlier this year showed MRSA in January and Serratia in May. January also had a few viridans strep. Recent x-rays of both feet were done and [name] office and I don't have these reports. The patient has known PAD and has a history of aleft femoropopliteal bypass in April 2017. 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 Dwyane Dee. In spite of this the last hemoglobin A1c I can see is 10.1 on 01/01/2017. She is being referred by 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 She is been using Medihoney to this area and also an area on the left midfoot. She is using healing sandals bilaterally. ABIs in this clinic at the left posterior tibial was 1.1 noncompressible on the right READMISSION Non invasive vascular NOVANT 5/18 Aftercare following surgery of the circulatory system Procedure Note - Interface, External Ris In - 08/13/2016 11:05 AM EDT Procedure: Examination consists of physiologic resting arterial pressures of the brachial and ankle arteries bilaterally with continuous wave Doppler waveform analysis.  Previous: Previous exam performed on 02/14/16 demonstrated ABIs of Rt = 1.19 and Lt = 1.33. Right: ABI = non-compressible PT 1.47 DP. S/P transmet amputation. , Left: ABI = 1.52, 2nd digit pressure = 87 mmHg Conclusions: Right: ABI (>1.3) may be falsely elevated, suggesting medial calcification. Left: ABI (>1.3) may be falsely elevated, suggesting medial calcification The patient is a now 49 year old type I diabetic is had multiple issues her graded to chronic diabetic foot ulcers. She has had a previous right  transmetatarsal amputation fifth ray amputation. She had Charcot feet diabetic polyneuropathy. We had her in the clinic lastin November. At that point she had wounds on her bilateral feet.she had wanted to try hyperbarics however the healogics review process denied her because she hadn't followed up with her vascular surgeon for her left femoropopliteal bypass. The bypass was done by Dr. Raul Del at [facility]. We made her a follow-up with Dr. Raul Del however she did not keep the appointment and therefore she was not approved The patient shows me a small wound on her left fourth metatarsal head on her phone. She developed rapid discoloration in the plantar aspect of the left foot and she was admitted to hospital from 2/2 through 05/10/17 with wet gangrene of the left foot osteomyelitis of the fourth metatarsal heads. She was admitted acutely ill with a temperature of 103. She was started on broad-spectrum vancomycin and cefepime. On 05/06/17 she was taken to the OR by [name] her podiatric surgeon for an incision and drainage irrigation of the left foot wound. Cultures from this surgery revealed group be strep and anaerobes. she was seen by Dr.Xu of orthopedic surgery and scheduled for a below-knee amputation which she u refused. Ultimately she was discharged on Levaquin and Flagyl for one month. MRI 05/05/17 done while she was in the hospital showed abscess adjacent to the fourth metatarsal head and neck small abscess around the fourth flexor tendon. Inflammatory phlegmon and gas in the soft tissues along the lateral aspect of the fourth phalanx. Findings worrisome for osteomyelitis involving the fourth proximal and middle phalanx and also the third and fourth metatarsals. Finally the patient had actually shortly before this followed up with Dr. Raul Del at no time on 04/29/17. He felt that her left femoropopliteal bypass was patent he felt that her left-sided toe pressures more than adequate for  healing a wound on the left foot. This was before her acute presentation. 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 05/28/17; she is started hyperbarics. The patient tells me that for some reason she was not actually on Levaquin but I think on ciprofloxacin. She was on Flagyl. She only started her Levaquin yesterday due to some difficulty with the pharmacy and perhaps her sister picking it up. She has an appointment with [name] tomorrow and with infectious disease early next week. She has no new complaints 06/06/17; the patient continues in hyperbarics. She saw [name] on 05/29/17 who is her podiatric surgeon. He is elected for a transmetatarsal amputation on 06/27/17. I'm not sure at what level he plans to do this amputation. The patient is unaware She also saw Dr. Megan Salon of infectious disease who elected to continue her on current antibiotics I think this is ciprofloxacin and Flagyl. I'll need to clarify with her tomorrow if she actually has this. We're using silver alginate to the actual wound. Necrotic surface today with material under the flap of her foot. Original MRI showed abscesses as well as osteomyelitis of the proximal and middle fourth phalanx  and the third and fourth metatarsal heads 06/11/17; patient continues in hyperbarics and continues on oral antibiotics. She is doing well. The wound looks better. The necrotic part of this under the flap in her superior foot also looks better. she is been to see [name]. I haven't had a chance to look at his note. Apparently he has put the transmetatarsal amputation on hold her request it is still planning to take her to the OR for debridement and product application ACEL. I'll see if I can find his note. I'll therefore leave product ordering/requests to [name] for now. I was going to look at Dermagraft 06/18/17-she is here in follow-up evaluation for bilateral foot wounds. She continues with hyperbaric therapy. She states she has been  applying manuka honey to the right plantar foot and alternate manuka honey and silver alginate to the left foot, despite our orders. We will continue with same treatment plan and she will follo up next week. 06/25/17; I have reviewed [name] last note from 3/11. She has operative debridement in 2 days' time. By review his note apparently they're going to place there is skin over the majority of this wound which is a good choice. She has a small satellite area at the most proximal part of this wound on the left plantar foot. The area on the right plantar foot we've been using silver alginate and it is close to healing. 07/02/17; unfortunately the patient was not easily approved for [name] proposed surgery. I'm not completely certain what the issue is. She has been using silver alginate to the wound she has completed a first course of hyperbarics. She is still on Levaquin and Flagyl. I have really lost track of the time course here.I suspect she should have another week to 2 of antibiotics. I'll need to see if she is followed up with infectious disease Dr. Megan Salon 07/09/17; the patient is followed up with Dr. Megan Salon. 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 She continues on Levaquin and metronidazole continuing both of these for now I think she is been on fr about 6 weeks. She still has some drainage but no pain. No fever. Her had been plans for her to go to the OR for operative debridement with her podiatrist [name], I am not exactly sure where that is. I'll probably slip a note to [name] today. I note that she follows with Dr. Dwyane Dee of endocrinology. We have her recertified for hyperbaric oxygen. I have not heard about Dermagraft however I'll see if [name] is planning a skin substitute as well 07/16/17; the patient tells me she is just about out of Comunas. I'll need to check Dr. Hale Bogus last notes on this. She states she has plenty of  Flagyl however. She comes in today complaining of pain in the right lateral foot which she said lasted for about a day. The wound on the right foot is actually much more medially. She also tells me that the [facility] cost a lot of pain in the left foot wound and she turned back to silver alginate. Finally Dermagraft has a $017 per application co-pay. She cannot afford this 07/23/17; patient arrives today with the wound not much smaller. There is not much new to add. She has not heard from [name] all try to put in a call to them today. She was asking about Dermagraft again and she has an over $793 per application co-pay  she states that she would be willing to try to do a payment plan. I been tried to avoid this. We've been using silver alginate, I'll change to [facility] 07/30/17-She is here in follow-up evaluation for left foot ulcer. She continues hyperbaric medicine. The left foot ulcer is stable we will continue with same treatment plan 08/06/17; she is here for evaluation of her left foot ulcer. Currently being treated for hyperbarics or underlying osteomyelitis. She is completed antibiotics. The left foot ulcer is better smaller with healthier looking granulation. For various reasons I am not really clear on we never got her back to the OR with [name]. He did not respond to my secure text message. Nevertheless I think that surgery on this point is not necessary nor am I completely clear that a skin substitute is necessary The patient is complaining about pain on the outside of her right foot. 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 There is no erythema. She also states the foot is warm versus her other part of her upper leg and this is largely true. It is not totally clear to me what's causing this. She thinks it's different from her usual neuropathy pain 08/13/17; she arrives in clinic today with a small wound which is superficial on her right first metatarsal head.  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 She tells [place] she was up on her feet over the Mother's Day celebration. The large wound is on the left foot. Continues with hyperbarics for underlying osteomyelitis. We're using Hydrofera Blue. She asked me today about where we were with Dermagraft. I had actually excluded this because of the co-pay however she wants to assume this therefore I'll recheck the co-pay an order for next week. 08/20/17; the patient agreed to accept the co-pay of the first Apligraf which we applied today. She is disappointed she is finishing hyperbarics will run this through the insurance on the extent of the foot infection and the extent of the wound that she had however she is already had 60 dive's. Dermagraft No. 1 08/27/17; Dermagraft No. 2. She is not eligible for any more hyperbaric treatments this month. She reports a fair amount of drainage and she actually changed to the external dressings without disturbing the direct contact layer 09/03/17; the patient arrived in clinic today with the wound superficially looking quite healthy. Nice vibrant red tissue with some advancing epithelialization although not as much adherence of the flap as I might like. However she noted on her own fourth toe some bogginess and she brought that to our attention. Indeed this was boggy feeling like a possibility of subcutaneous fluid. 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 She is not been unwell. We've been using Dermagraft 09/10/17; the culture that I did not last week was MRSA. She saw Dr. Megan Salon this morning who is going to start her on vancomycin. I had sent him a secure a text message yesterday. I also spoke with her podiatric surgeon [name] about surgery on this foot the options for conserving a functional foot etc. Promised me he would see her and will make back consultation today. Paradoxically  her actual wound on the plantar aspect of her left foot looks really quite good. I had given her 5 days worth of Baxdella to cover her for MRSA. Her MRI came back showing osteomyelitis within the third metatarsal shaft and head and  base of the third and fourth proximal phalanx. She had extensive inflammatory changes throughout the soft tissue of the lateral forefoot. With an ill-defined fluid around the fourth metatarsal extending into the plantar and dorsal soft tissues 09/19/17; the patient is actually on oral Septra and Flagyl. She apparently refused IV vancomycin. She also saw [name] at my request who is planning her for a left BKA sometime in mid July. MRI showed osteomyelitis within the third metatarsal shaft and head and the basis of the third and fourth proximal phalanx. I believe there was felt to be possible septic arthritis involving the third MTP. 09/26/17; the patient went back to Dr. Megan Salon at my suggestion and is now receiving IV daptomycin. Her wound continues to look quite good making the decision to proceed with a transmetatarsal amputation although more difficult for the patient. I believe in my extensive discussions with her she has a good sense of the pros and cons of this. I don't NV the tuft decision she has to make. She has an appointment with [name] I believe in mid July and I previously spoken to him about this issue Has we had used 3 previous Dermagraft. Given the condition of the wound surface I went ahead and added the fourth one today area and I did this not fully realizing that she'll be traveling to [place] next week. I'm hopeful she can come back in 2 weeks 10/21/17; Her same Dermagraft on for about 3-1/2 weeks. In spite of this the wound arrives looking quite healthy. There is been a lot of healing dimensions are smaller. Looking at the square shaped wound she has now there is some undermining and some depth medially under the undermining although I cannot  palpate any bone. No surrounding infection is obvious. She has difficult questions about how to look at this going forward vis--vis amputations versus continued medical therapy. T be truthful the wound is looks o so healthy and it is continued to contract. Hard to justify foot surgery at this point although I still told her that I think it might come to that if we are not able to eradicate the underlying MRSA. She is still highly at risk and she understands this 11/06/17 on evaluation today patient appears to be doing better in regard to her foot ulcer. She's been tolerating the dressing changes without complication. Currently she is here for her Dermagraft #6. Her wound continues to make excellent progress at this point. She does not appear to have any evidence of infection which is good news. 11/13/17 on evaluation today patient appears to be doing excellent at this time. She is here for repeat Dermagraft application. This is #7. Overall her wound seems to be making great progress. 12/05/17; the patient arrives with the wound in much better condition than when I last saw this almost 6 weeks ago. She still has a small probing area in the left metatarsal head region on the lateral aspect of her foot. We applied her last Dermagraft today. Since the last time she is here she has what appears to a been a blood blister on the plantar aspect of left foot although I don't see this is threatening. There is also a thick raised tissue on the right mid metatarsal head region. This was not there I don't think the last time she was here 3 weeks ago. 12/12/17; the patient continues to have a small programming area in the left metatarsal head region on the lateral aspect of her foot which  was the initial large surgical wound. I applied her last Apligraf last week. I'm going to use Endoform starting today Unfortunately she has an excoriated area in the left mid foot and the right mid foot. The left midfoot looks like a  blistered area this was not opened last week it certainly is open today. Using silver alginate on these areas. She promises me she is offloading this. 12/19/17; the small probing area in the left metatarsal head eyes think is shallower. In general her original wound looks better. We've been using Endoform. The area inferiorly that I think was trauma last week still requires debridement a lot of nonviable surface which I removed. She still has an open open area distally in her foot Similarly on the right foot there is tightly adherent surface debris which I removed. Still areas that don't look completely epithelialized. This is a small open area. We used silver alginate on these areas 12/26/2017; the patient did not have the supplies we ordered from last week including the Endoform. The original large wound on the left lateral foot looks healthy. She still has the undermining area that is largely unchanged from last week. She has the same heavily callused raised edged wounds on the right mid and left midfoot. Both of these requiring debridement. We have been using silver alginate on these areas 01/02/2018; there is still supply issues. We are going to try to use Prisma but I am not sure she actually got it from what she is saying. She has a new open area on the lateral aspect of the left fourth toe [previous fifth ray amputation]. Still the one tunneling area over the fourth metatarsal head. The area is in the midfoot bilaterally still have thick callus around them. She is concerned about a raised swelling on the lateral aspect of the foot. However she is completely insensate 01/10/2018; we are using Prisma to the wounds on her bilateral feet. Surprisingly the tunneling area over the left fourth metatarsal head that was part of her original surgery has closed down. She has a small open area remaining on the incision line. 2 open areas in the midfoot. 02/10/2018; the patient arrives back in clinic after a  month hiatus. She was traveling to visit family in [place]. Is fairly clear she was not offloading the areas on her feet. The original wound over the left lateral foot at the level of metatarsal heads is reopened and probes medially by about a centimeter or 2. She notes that a week ago she had purulent drainage come out of an area on the left midfoot. Paradoxically the worst area is actually on the right foot is extensive with purulent drainage. We will use silver alginate today 02/17/2018; the patient has 3 wounds one over the left lateral foot. She still has a small area over the metatarsal heads which is the remnant of her original surgical wound. This has medial probing depth of roughly 1.4 cm somewhat better than last week. The area on the right foot is larger. We have been using silver alginate to all areas. The area on the right foot and left foot that we cultured last week showed both Klebsiella and Proteus. Both of these are quinolone sensitive. The patient put her's self on Bactrim and Flagyl that she had left hanging around from prior antibiotic usages. She was apparently on this last week when she arrived. I did not realize this. Unfortunately the Bactrim will not cover either 1 of these organisms. We will send  in Cipro 500 twice daily for a week 03/04/2018; the patient has 2 wounds on the left foot one is the original wound which was a surgical wound for a deep DFU. At one point this had exposed bone. She still has an area over the fourth metatarsal head that probes about 1.4 cm although I think this is better than last week. I been using silver nitrate to try and promote tissue adherence and been using silver alginate here. She also has an area in the left midfoot. This has some depth but a small linear wound. Still requiring debridement. On the right midfoot is a circular wound. A lot of thick callus around this area. We have been using silver alginate to all wound areas She is  completed the ciprofloxacin I gave her 2 weeks ago. 03/11/2018; the patient continues to have 2 open areas on the left foot 1 of which was the original surgical wound for a deep DFU. Only a small probing area remains although this is not much different from last week we have been using silver alginate. The other area is on the midfoot this is smaller linear but still with some depth. We have been using silver alginate here as well On the right foot she has a small circular wound in the mid aspect. This is not much smaller than last time. We have been using silver alginate here as well 03/18/2018; she has 3 wounds on the left foot the original surgical wound, a very superficial wound in the mid aspect and then finally the area in the mid plantar foot. She arrives in today with a very concerning area in the wound in the mid plantar foot which is her most proximal wound. There is undermining here of roughly 1-1/2 cm superiorly. Serosanguineous drainage. She tells me she had some pain on for over the weekend that shot up her foot into her thigh and she tells me that she had a nodule in the groin area. She has the single wound in the right foot. We are using endoform to both wound areas 03/24/2018; the patient arrives with the original surgical wound in the area on the left midfoot about the same as last week. There is a collection of fluid under the surface of the skin extending from the surgical wound towards the midfoot although it does not reach the midfoot wound. The area on the right foot is about the same. Cultures from last week of the left midfoot wound showed abundant Klebsiella abundant Enterococcus faecalis and moderate methicillin resistant staph I gave her Levaquin but this would have only covered the Klebsiella. She will need linezolid 04/01/2018; she is taking linezolid but for the first few days only took 1 a day. I have advised her to finish this at twice daily dosing. In any case all of  her wounds are a lot better especially on the left foot. The original surgical wound is closed. The area on the left midfoot considerably smaller. The area on the right foot also smaller. 04/08/2018; her original surgical wound/osteomyelitis on the left foot remains closed. She has area on the left foot that is in the midfoot area but she had some streaking towards this. This is not connected with her original wound at least not visually. Small wound on the right midfoot appears somewhat smaller. 04/15/18; both wounds looks better. Original wound is better left midfoot. Using silver alginate 1/21; patient states she uses saltwater soak in, stones or remove callus from around her wounds.  She is also concerned about a blood blister she had on the left foot but it simply resolved on its own. We've been using silver alginate 1/28; the patient arrives today with the same streaking area from her metatarsals laterally [the site of her original surgical wound] down to the middle of her foot. There is some drainage in the subcutaneous area here. This concerns me that there is actually continued ongoing infection in the metatarsals probably the fourth and third. This fixates an MRI of the foot without contrast [chronic renal failure] The wound in the mid part of the foot is small but I wonder whether this area actually connects with the more distal foot. The area on the right midfoot is probably about the same. Callus thick skin around the small wound which I removed with a curette we have been using silver alginate on both wound areas 2/4; culture I did of the draining site on the left foot last time grew methicillin sensitive staph aureus. MRI of the left foot showed interval resolution of the findings surrounding the third metatarsal joint on the prior study consistent with treated osteomyelitis. Chronic soft tissue ulceration in the plantar and lateral aspect of the forefoot without residual focal fluid  collection. No evidence of recurrent osteomyelitis. 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 She tells me she had nausea and vomiting over the weekend and missed 2 days. In general her foot looks much the same. She has a small open area just below the left fourth metatarsal head. A linear area in the left midfoot. Some discoloration extending from the inferior part of this into the left lateral foot although this appears to be superficial. She has a small area on the right midfoot which generally looks smaller after debridement 2/18; the patient is completing his cephalexin and has another 2 days. She continues to have open areas on the left and right foot. 2/25; she is now off antibiotics. The area on the left foot at the site of her original surgical wound has closed yet again. She still has open areas in the mid part of her foot however these appear smaller. The area on the right mid foot looks about the same. We have been using silver alginate She tells me she had a serious hypoglycemic spell at home. She had to have EMS called and get IV dextrose 3/3; disappointing on the left lateral foot large area of necrotic tissue surrounding the linear area. This appears to track up towards the same original surgical wound. Required extensive debridement. The area on the right plantar foot is not a lot better also using silver 3/12; the culture I did last time showed abundant enterococcus. I have prescribed Augmentin, should cover any unrecognized anaerobes as well. In addition there were a few MRSA and Serratia that would not be well covered although I did not want to give her multiple antibiotics. She comes in today with a new wound in the right midfoot this is  not connected with the original wound over her MTP a lot of thick callus tissue around both wounds but once again she said she is not walking on these areas 3/17-Patient comes in for follow-up on the bilateral plantar wounds, the right midfoot and the left plantar  wound. Both these are heavily callused surrounding the wounds. We are continuing to use silver alginate, she is compliant with offloading and states she uses a wheelchair fairly often at home 3/24; both wound areas have thick callus. However things actually look quite a bit better here for the majority of her left foot and the right foot. 3/31; patient continues to have thick callused somewhat irritated looking tissue around the wounds which individually are fairly superficial. There is no evidence of surrounding infection. We have been using silver alginate however I change that to [facility] today 4/17; patient returns to clinic after having a scare with Covid she tested negative in her primary doctor's office. She has been using Hydrofera Blue. She does not have an open area on the right foot. On the left foot she has a small open area with the mid area not completely viable. She showed me pictures of what looks like a hemorrhagic blister from several days ago but that seems to have healed over this was on the lateral left foot 4/21; patient comes in to clinic with both her wounds on her feet closed. However over the weekend she started having pain in her right foot and leg up into the thigh. She felt as though she was running a low-grade fever but did not take her temperature. She took a doxycycline that she had leftover and yesterday a single Septra and metronidazole. She thinks things feel somewhat better. 4/28; duplex ultrasound I ordered last week was negative for DVT or superficial thrombophlebitis. She is completed the doxycycline I gave her. States she is still having a lot of pain in the right calf and right ankle which is no  better than last week. She cannot sleep. She also states she has a temperature of up to 101, coughing and complaining of visual loss in her bilateral eyes. Apparently she was tested for Covid 2 weeks ago at [facility] and that was negative. Readmission: 09/03/18 patient presents back for reevaluation after having been evaluated at the end of April regarding erythema and swelling of her right lower extremity. Subsequently she ended up going to the hospital on 07/29/18 and was admitted not to be discharged until 08/08/18. Unfortunately it was noted during the time that she was in the hospital that she did have methicillin-resistant Staphylococcus aureus as the infection noted at the site. It was also determined that she did have osteomyelitis which appears to be fairly significant. She was treated with vancomycin and in fact is still on IV vancomycin at dialysis currently. This is actually slated to continue until 09/12/18 at least which will be the completion of the six weeks of therapy. Nonetheless based on what I'm seeing at this point I'm not sure she will be anywhere near ready to discontinue antibiotics at that time. Since she was released from the hospital she was seen by [name] who is her podiatrist on 08/27/18. His note specifically states that he is recommended that the patient needs of one knee amputation on the right as she has a life- threatening situation that can lead quickly to sepsis. The patient advised she would like to try to save her leg to which [name] apparently told her that this was against all medical advice. She also want to discontinue the Wound VAC which had been initiated due to the fact that she wasn't pleased with how the wound was looking and subsequently she wanted to pursue applying Medihoney at that time. He stated that he did  not believe that the right lower extremity was salvageable and that the patient understood but would still like to attempt hyperbaric option therapy  if it could be of any benefit. She was therefore referred back to [place] for further evaluation. He plans to see her back next week. Upon inspection today patient has a significant amount purulent drainage noted from the wound at this point. The bone in the distal portion of her foot also appears to be extremely necrotic and spongy. When I push down on the bone it bubbles and seeps purulent drainage from deeper in the end of the foot. I do not think that this is likely going to heal very well at all and less aggressive surgical debridement were undertaken more than what I believe we can likely do here in our office. 09/12/2018; I have not seen this patient since the most recent hospitalization although she was in our clinic last week. I have reviewed some of her records from a complex hospitalization. 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 There is situation was complicated by MRSA bacteremia and acute on chronic renal failure now on dialysis. She is receiving vancomycin at dialysis. We started her on Dakin's wet-to-dry last week she is changing this daily. There is still purulent drainage coming out of her foot. Although she is apparently "agreeable" to a below-knee amputation which is been suggested by multiple clinicians she wants this to be done in [place]. She apparently has a telehealth visit with that provider sometime in late [place] 6/24. I have told her I think this is probably too long. Nevertheless I could not convince her to allow a local doctor to perform BKA. 09/19/2018; the patient has a large necrotic area on the right anterior foot. She has had previous transmetatarsal amputations. Culture I did last week showed MRSA nothing else she is on vancomycin at dialysis. She has continued leaking purulent drainage out of the distal part of the large circular wound on the right anterior foot. She apparently went to see Dr. Berenice Primas of orthopedics  to discuss scheduling of her below-knee amputation. Somehow that translated into her being referred to plastic surgery for debridement of the area. I gather she basically refused amputation although I do not have a copy of Dr. Berenice Primas notes. The patient really wants to have a trial of hyperbaric oxygen. I agreed with initial assessment in this clinic that this was probably too far along to benefit however if she is going to have plastic surgery I think she would benefit from ancillary hyperbaric oxygen. The issue here is that the patient has benefited as maximally as any patient I have ever seen from hyperbaric oxygen therapy. Most recently she had exposed bone on the lateral part of her left foot after a surgical procedure and that actually has closed. She has eschared areas in both heels but no open area. She is remained systemically well. I am not optimistic that anything can be done about this but the patient is very clear that she wants an attempt. The attempt would include a wound VAC further debridements and hyperbaric oxygen along with IV antibiotics. 6/26; I put her in for a trial of hyperbaric oxygen only because of the dramatic response she has had with wounds on her left midfoot earlier this year which was a surgical wound that went straight to her bone over the metatarsal heads and also remotely the left third toe. We will see if we can  get this through our review process and insurance. She arrives in clinic with again purulent material pouring out of necrotic bone on the top of the foot distally. There is also some concerning erythema on the front of the leg that we marked. It is bit difficult to tell how tender this is because of neuropathy. I note from infectious disease that she had her vancomycin extended. 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 She had the wound VAC on for part of the week. The rest of the time she is putting various things on this  including Medihoney, "ionized water" silver sorb gel etc. 7/7; follow-up along with HBO. She is still on vancomycin at dialysis. She has a large open area on the dorsal right foot and a small dark eschar area on her heel. There is a lot less erythema in the area and a lot less tenderness. From an infection point of view I think this is better. She still has a lot of necrosis in the remaining right forefoot [previous TMA] we are still using the wound VAC in this area 7/16; follow-up along with HBO. I put her on linezolid after she finished her vancomycin. We started this last Friday I gave her 2 weeks worth. I had the expectation that she would be operatively debrided by Dr. Marla Roe but that still has not happened yet. Patient phoned the office this week. She arrives for review today after HBO. The distal part of this wound is completely necrotic. Nonviable pieces of tendon bone was still purulent drainage. Also concerning that she has black eschar over the heel that is expanding. I think this may be indicative of infection in this area as well. She has less erythema and warmth in the ankle and calf but still an abnormal exam 7/21 follow-up along with HBO. I will renew her linezolid after checking a CBC with differential monitoring her blood counts especially her platelets. She was supposed to have surgery yesterday but if I am reading things correctly this was canceled after her blood sugar was found to be over 500. I thought Dr. Marla Roe who called me said that they were sending her to the ER but the patient states that was not the case. 7/28. Follow-up along with HBO. She is on linezolid I still do not have any lab work from dialysis even though I called last week. The patient is concerned about an area on her left lateral foot about the level of the base of her fifth metatarsal. I did not really see anything that ominous here however this patient is in [place] ability to point out problems that  she is sensing and she has been accurate in the past Finally she received a call from Dr. Marla Roe who is referring her to another orthopedic surgeon stating that she is too booked up to take her to the operating room now. Was still using a wound VAC on the foot 8/3 -Follow-up after HBO, she is got another week of linezolid, she is to call [facility] for an appointment, x-rays of both feet were reviewed, the left foot x-ray with third MTP joint osteo- Right foot x-ray widespread osteo-in the right midfoot Right ankle x-ray does not show any active evidence of infection 8/11-Patient is seen after HBO, the wounds on the right foot appear to be about the same, the heel wound had some necrotic base over tendon that was debrided with a curette 8/21; patient is seen after HBO. The patient's wound  on her dorsal foot actually looks reasonably good and there is substantial amount of epithelialization however the open area distally still has a lot of necrotic debris partially bone. I cannot really get a good sense of just how deep this probes under the foot. She has been pressuring me this week to order medical maggots through a company in [place] for her. The problem I have is there is not a defined wound area here. On the positive side there is no purulence. She has been to see infectious disease she is still on Septra DS although I have not had a chance to review their notes 8/28; patient is seen in conjunction with HBO. The wounds on her foot continued to improve including the right dorsal foot substantially the, the distal part of this wound and the area on the right heel. We have been using a wound VAC over this chronically. She is still on trimethoprim as directed by infectious disease 9/4; patient is seen in conjunction with HBO. Right dorsal foot wound substantially anteriorly is better however she continues to have a deep wound in the distal part of this that is not responding. We have been using  silver collagen under border foam Area on the right plantar medial heel seems better. We have been using Hydrofera Blue 12/12/18 on evaluation today patient appears to be doing about the same with regard to her wound based on prior measurements. She does have some necrotic tissue noted on the lateral aspect of the wound that is going require a little bit of sharp debridement today. This includes what appears to be potentially either severely necrotic bone or tendon. Nonetheless other than that she does not appear to have any severe infection which is good news 9/18; it is been 2 weeks since I saw this wound. She is tolerating HBO well. Continued dramatic improvement in the area on the right dorsal foot. She still has a small wound on the heel that we have been using Hydrofera Blue. She continues with a wound VAC 9/24; patient has to be seen emergently today with a swelling on her right lateral lower leg. She says that she told Dr. Evette Doffing about this and also myself on a couple of occasions but I really have no recollection of this. She is not systemically unwell and her wound really looked good the last time I saw this. She showed this to providers at dialysis and she was able to verify that she was started on cephalexin today for 5 doses at dialysis. She dialyzes on Tuesday Thursday and Saturday. 10/2; patient is seen in conjunction with HBO. The area that is draining on the right anterior medial tibia is more extensive. Copious amounts of serosanguineous drainage with some purulence. We are still using the wound VAC on the original wound then it is stable. Culture I did of the original IandD showed MRSA I contacted dialysis she is now on vancomycin with dialysis treatments. I asked them to run a month 10/9; patient seen in conjunction with HBO. She had a new spontaneous open area just above the wound on the right medial tibia ankle. More swelling on the right medial tibia. Her wound on the foot  looks about the same perhaps slightly better. There is no warmth spreading up her leg but no obvious erythema. her MRI of the foot and ankle and distal tib-fib is not booked for next Friday I discussed this with her in great detail over multiple days. it is likely she has spreading  infection upper leg at least involving the distal 25% above the ankle. She knows that if I refer her to orthopedics for infectious disease they are going to recommend amputation and indeed I am not against this myself. We had a good trial at trying to heal the foot which is what she wanted along with antibiotics debridement and HBO however she clearly has spreading infection [probably staph aureus/MRSA]. Nevertheless she once again tells me she wants to wait the left of the MRI. She still makes comments about having her amputation done in [place]. 10/19; arrives today with significant swelling on the lateral right leg. Last culture I did showed Klebsiella. Multidrug-resistant. Cipro was intermediate sensitivity and that is what I have her on pending her MRI which apparently is going to be done on Thursday this week although this seems to be moving back and forth. She is not systemically unwell. We are using silver alginate on her major wound area on the right medial foot and the draining areas on the right lateral lower leg 10/26; MRI showed extensive abscess in the anterior compartment of the right leg also widespread osteomyelitis involving osseous structures of the midfoot and portions of the hindfoot. Also suspicion for osteomyelitis anterior aspect of the distal medial malleolus. Culture I did of the purulence once again showed a multidrug-resistant Klebsiella. I have been in contact with nephrology late last week and she has been started on cefepime at dialysis to replace the vancomycin We sent a copy of her MRI report to Dr. Geroge Baseman in [place] who is an orthopedic surgeon. The patient takes great stock in his  opinion on this. She says she will go to [place] to have her leg amputated if Dr. Geroge Baseman does not feel there is any salvage options. 11/2; she still is not talk to her orthopedic surgeon in [place]. Apparently he will call her at [phone] this afternoon. The quality of this is she has not allowed me to refer her anywhere. She has been told over and over that she needs this amputated but has not agreed to be referred. She tells me her blood sugar was 600 last night but she has not been febrile. 11/9; she never did got a call from the orthopedic surgeon in [place] therefore that is off the radar. We have arranged to get her see orthopedic surgery at [facility]. She still has a lot of draining purulence coming out of the new abscess in her right leg although that probably came from the osteomyelitis in her right foot and heel. Meanwhile the original wound on the right foot looks very healthy. Continued improvement. The issue is that the last MRI showed osteomyelitis in her right foot extensively she now has an abscess in the right anterior lower leg. There is nobody in [place] who will offer this woman anything but an amputation and to be honest that is probably what she needs. I think she still wants to talk about limb salvage although at this point I just do not see that. She has completed her vancomycin at dialysis which was for the original staph aureus she is still on cefepime for the more recent Klebsiella. She has had a long course of both of these antibiotics which should have benefited the osteomyelitis on the right foot as well as the abscess. 11/16; apparently [place] elective surgery is shut down because of COVID-19 pandemic. I have reached out to some contacts at [facility] to see if we can get her an orthopedic appointment there. I am  concerned about continually leaving this but for the moment everything is static. In fact her original large wound on this foot is closing down. It  is the abscess on the right anterior leg that continues to drain purulent serosanguineous material. She is not currently on any antibiotics however she had a prolonged course of vancomycin [1 month] as well as cefepime for a month 02/24/2019 on evaluation today patient appears to be doing better than the last time I saw her. This is not a patient that I typically see. With that being said I am covering for Dr. Dellia Nims this week and again compared to when I last saw her overall the wounds in particular seem to be doing significantly better which is good news. With that being said the patient tells me several disconcerting things. She has not been able to get in to see anyone for potential debridement in regard to her leg wounds although she tells me that she does not think it is necessary any longer because she is taking care of that herself. She noticed a string coming out of the lower wound on her leg over the last week. The patient states that she subsequently decided that we must of pack something in there and started pulling the string out and as it kept coming and coming she realized this was likely her tendon. With that being said she continued to remove as much of this as she could. She then I subsequently proceeded to using tubes of antibiotic ointment which she will stick down into the wound and then scored as much as she can until she sees it coming out of the other wound opening. She states that in doing this she is actually made things better and there is less redness and irritation. With regard to her foot wound she does have some necrotic tendon and tissue noted in one small corner but again the actual wound itself seems to be doing better with good granulation in general compared to my last evaluation. 12/7; continued improvement in the wound on the substantial part of the right medial foot. Still a necrotic area inferiorly that required debridement but the rest of this looks very healthy  and is contracting. She has 2 wounds on the right lateral leg which were her original drainage sites from her abscess but all of this looks a lot better as well. She has been using silver alginate after putting antibiotic biotic ointment in one wound and watching it come out the other. I have talked to her in some detail today. I had given her names of orthopedic surgeons at [facility] for second opinion on what to do about the right leg. I do not think the patient never called them. She has not been able to get a hold of the orthopedic surgeon in [place] that she had put a lot of faith in as being somebody would give her an opinion that she would trust. I talked to her today and said even if I could get her in to another orthopedic surgeon about the leg which she accept an amputation and she said she would not therefore I am not going to press this issue for the moment 12/14; continued improvement in his substantial wound on the right medial foot. There is still a necrotic area inferiorly with tightly adherent necrotic debris which I have been working on debriding each time she is here. She does not have an orthopedic appointment. Since last time she was here I looked over her  cultures which were essentially MRSA on the foot wound and gram-negative rods in the abscess on the anterior leg. 12/21; continued improvement in the area on the right medial foot. She is not up on this much and that is probably a good thing since I do not know it could support continuous ambulation. She has a small area on the right lateral leg which were remanence of the IandD's I did because of the abscess. I think she should probably have prophylactic antibiotics I am going to have to look this over to see if we can make an intelligent decision here. In the meantime her major wound is come down nicely. Necrotic area inferiorly is still there but looks a lot better 04/06/2019; she has had some improvement in the overall  surface area on the right medial foot somewhat narrowedr both but somewhat longer. The areas on the right lateral leg which were initial IandD sites are superficial. Nothing is present on the right heel. We are using silver alginate to the wound areas 1/18; right medial foot somewhat smaller. Still a deep probing area in the most distal recess of the wound. She has nothing open on the right leg. She has a new wound on the plantar aspect of her left fourth toe which may have come from just pulling skin. The patient using Medihoney on the wound on her foot under silver alginate. I cannot discourage her from this 2/1; 2-week follow-up using silver alginate on the right foot and her left fourth toe. The area on the right dorsal foot is contracted although there is still the deep area in the most distal part of the wound but still has some probing depth. No overt infection 2/15; 2-week follow-up. She continues to have improvement in the surface area on the dorsal right foot. Even the tunneling area from last time is almost closed. The area that was on the plantar part of her left fourth toe over the PIP is indeed closed 3/1; 2-week follow-up. Continued improvement in surface area. The original divot that we have been debriding inferiorly I think has full epithelialization although the epithelialization is gone down into the wound with probably 4 mm of depth. Even under intense illumination I am unable to see anything open here. The remanence of the wound in this area actually look quite healthy. We have been using silver alginate 3/15; 2-week follow-up. Unfortunately not as good today. She has a comma shaped wound on the dorsal foot however the upper part of this is larger. Under illumination debris on the surface She also tells [place] that she was on her right leg 2 times in the last couple of weeks mostly to reach up for things above her head etc. She felt a sharp pain in the right leg which she thinks is  somewhere from the ankle to the knee. The patient has neuropathy and is really uncertain. She cannot feel her foot so she does not think it was coming from there 3/29; 2-week follow-up. Her wound measures smaller. Surface of the wound appears reasonable. She is using silver alginate with underlying Medihoney. She has home health. X-rays I did of her tib-fib last time were negative although it did show arterial calcification 4/12; 2-week follow-up. Her wound measures smaller in length. Using manuka honey with silver alginate on top. She has home health. 4/26; 2-week follow-up. Her wound is smaller but still very adherent debris under illumination requiring debridement she has been using manuka honey with silver alginate. She has  home health 08/28/19-Wound has about the same size, but with a layer of eschar at the lateral edge of the amputation site on the right foot. Been using Hydrofera Blue. She is on suppressive Bactrim but apparently she has been taking it twice daily 6/7; I have not seen this wound and about 6 weeks. Since then she was up in [place]. By her own admission she was walking on the foot because she did not have a wheelchair. The wound is not nearly as healthy looking as it was the last time I saw this. We ordered different things for her but she only uses Medihoney and silver alginate. As far as I know she is on suppressive trimethoprim sulfamethoxazole. She does not admit to any fever or chills. Her CBGs apparently are at baseline however she is saying that she feels some discomfort on the lateral part of her ankle I looked over her last inflammatory markers from the summer 2020 at which time she had a deeply necrotic infected wound in this area. On 11/10/2018 her sedimentation rate was 56 and C-reactive protein 9.9. This was 107 and 29 on 07/29/2018. 6/17; the patient had a necrotic wound the last time she was here on the right dorsal foot. After debridement I did a culture. This  showed a very resistant ESBL Klebsiella as well as Enterococcus. Her x-ray of the foot which was done because of warmth and some discomfort showed bone destruction within the carpal bones involving the navicular acute cuboid lateral middle cuneiforms but essentially unchanged from her prior study which was done on 10/29/2018. The findings were felt to represent chronic osteomyelitis. We did inflammatory markers on her. Her white count was 5.25 sedimentation rate 16 and C-reactive protein at 11.1. Notable for the fact that in August 2020 her CRP was 9.9 and sedimentation rate 56. I have looked at her x-rays. It is true that the bone destruction is very impressive however the patient came into this clinic for the wound on her right foot with pieces of bone literally falling out anteriorly with purulent material. I am not exactly sure I could have expected anything different. She has not been systemically unwell no fever chills or blood sugars have been reasonable. 6/28; she arrives with a right heel closed. The substantial area on the right anterior foot looks healthy. Much better looking surface. I think we can change to [facility] seems to help this previously. She is getting her antibiotics at dialysis she should be just about finished 7/9; changed to [facility] last week. Surface wound looks satisfactory not much change in surface area however. She is going to [place] state next week this is usually a difficult thing for this patient follow-up will be for 2 weeks. 7/23; using Hydrofera Blue. She returns from her trip and the wound looks surprisingly good. Usually when this patient goes on trips she comes back with a lot of problems with the wound. She is saying that she sometimes feels an episodic "crunching" feeling on the lateral part of the foot. She is neuropathic and not feeling pain but wonders whether this could be a neuropathic dysesthesia. 11/13/19-Patient returns after 3 weeks,  the wound itself is stable and patient states that there is nothing new going on she is on some extra anxiety medications and is resisting the temptation to pick at the dry skin around the wound. 9/20; patient has not been here in over a month and I have not seen her in 2 months. The wound  in terms of size I think is about the same. There is no exposed bone. She has a nonviable surface on this. She is supposed to be using [facility] however she is also been using some form of honey preparation as well as a silver-based dressings. I do not think she has any pattern to this. Electronic Signature(s) Signed: 12/22/2019 4:19:57 PM By: Linton Ham MD Entered By: Linton Ham on 12/21/2019 14:09:33 -------------------------------------------------------------------------------- Physical Exam Details Patient Name: Date of Service: [name] 12/21/2019 12:45 PM Medical Record Number: [id number] Patient Account Number: [id number] Date of Birth/Sex: Treating RN: 1970/09/06 (49 y.o. Nancy Fetter Primary Care Provider: [name], [name] Other Clinician: Referring Provider: Treating Provider/Extender: [name], [name] Weeks in Treatment: 84 Constitutional Patient is hypertensive.. Pulse regular and within target range for patient.Marland Kitchen Respirations regular, non-labored and within target range.. Temperature is normal and within the target range for the patient.Marland Kitchen Appears in no distress. Cardiovascular Pedal pulses are palpable on both sides. Notes Wound exam; right transmetatarsal amputation site wound with previous osteomyelitis. The wound looks stable in terms of size. Under illumination and nonviable surface. Yet I used an open curette to remove this certainly not easy. Bleeding controlled with direct pressure. Her peripheral pulses are palpable Electronic Signature(s) Signed: 12/22/2019 4:19:57 PM By: Linton Ham MD Entered By: Linton Ham on 12/21/2019  14:10:36 -------------------------------------------------------------------------------- Physician Orders Details Patient Name: Date of Service: [name] 12/21/2019 12:45 PM Medical Record Number: [id number] Patient Account Number: [id number] Date of Birth/Sex: Treating RN: 1971/01/11 (49 y.o. Nancy Fetter Primary Care Provider: [name], [name] Other Clinician: Referring Provider: Treating Provider/Extender: [name], [name] Weeks in Treatment: 6 Verbal / Phone Orders: No Diagnosis Coding ICD-10 Coding Code Description M86.671 Other chronic osteomyelitis, right ankle and foot L97.514 Non-pressure chronic ulcer of other part of right foot with necrosis of bone L97.411 Non-pressure chronic ulcer of right heel and midfoot limited to breakdown of skin E10.621 Type 1 diabetes mellitus with foot ulcer Follow-up Appointments Return Appointment in 2 weeks. Dressing Change Frequency Wound #43 Right,Medial Foot Change Dressing every other day. Wound Cleansing Wound #43 Right,Medial Foot Clean wound with Wound Cleanser - or normal saline Primary Wound Dressing Wound #43 Right,Medial Foot Medihoney Alginate Secondary Dressing Wound #43 Right,Medial Foot Kerlix/Rolled Gauze Dry Gauze Edema Control Elevate legs to the level of the heart or above for 30 minutes daily and/or when sitting, a frequency of: - throughout the day [place] skilled [facility] for wound care. - Interim Electronic Signature(s) Signed: 12/21/2019 5:09:36 PM By: [name] RN, BSN Signed: 12/22/2019 4:19:57 PM By: Linton Ham MD Entered By: [name] on 12/21/2019 13:57:02 -------------------------------------------------------------------------------- Problem List Details Patient Name: Date of Service: [name] 12/21/2019 12:45 PM Medical Record Number: [id number] Patient Account Number: [id number] Date of Birth/Sex: Treating  RN: 1970/07/27 (49 y.o. Nancy Fetter Primary Care Provider: [name], [name] Other Clinician: Referring Provider: Treating Provider/Extender: [name], [name] Weeks in Treatment: 67 Active Problems ICD-10 Encounter Code Description Active Date MDM Diagnosis M86.671 Other chronic osteomyelitis, right ankle and foot 09/03/2018 No Yes L97.514 Non-pressure chronic ulcer of other part of right foot with necrosis of bone 09/03/2018 No Yes L97.411 Non-pressure chronic ulcer of right heel and midfoot limited to breakdown of 09/17/2019 No Yes skin E10.621 Type 1 diabetes mellitus with foot ulcer 09/24/2018  No Yes Inactive Problems ICD-10 Code Description Active Date Inactive Date L97.521 Non-pressure chronic ulcer of other part of left foot limited to breakdown of skin 04/20/2019 04/20/2019 L97.812 Non-pressure chronic ulcer of other part of right lower leg with fat layer exposed 02/24/2019 02/24/2019 Resolved Problems ICD-10 Code Description Active Date Resolved Date L02.415 Cutaneous abscess of right lower limb 12/25/2018 12/25/2018 Electronic Signature(s) Signed: 12/22/2019 4:19:57 PM By: Linton Ham MD Entered By: Linton Ham on 12/21/2019 14:06:35 -------------------------------------------------------------------------------- Progress Note Details Patient Name: Date of Service: [name] 12/21/2019 12:45 PM Medical Record Number: [id number] Patient Account Number: [id number] Date of Birth/Sex: Treating RN: [birth year] (49 y.o. Nancy Fetter Primary Care Provider: [name], [name] Other Clinician: Referring Provider: Treating Provider/Extender: [name], [name] Weeks in Treatment: 52 Subjective History of Present Illness (HPI) 49 year old diabetic who is known to have type 1 diabetes which is poorly controlled last hemoglobin A1c was 11%. She comes in with a ulcerated area on the left lateral foot which has been there for over 6  months. Was recently she has been treated by [name] of podiatry who saw her last on 05/28/2016. Review of his notes revealed that the patient had incision and drainage with placement of antibiotic beads to the left foot on 04/11/2016 for possible osteomyelitis of the cuboid bone. Over the last year she's had a history of amputation of the left fifth toe and a femoropopliteal popliteal bypass graft somewhere in April 2017. 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 His note [name] mentions that the patient has been referred to me for further wound care and possibly great candidate for hyperbaric oxygen therapy due to recurrent osteomyelitis. However we do not have any x-rays of biopsy reports confirming this. He has been on several antibiotics including Bactrim and most recently is on doxycycline for an MRSA. I understand, the patient was not a candidate for IV antibiotics as she has had previous PICC lines which resulted in blood clots in both arms. There was a x-ray report dated 04/04/2016 on [name] notes which showed evidence of fifth ray resection left foot with osteolytic changes noted to the fourth metatarsal and cuboid bone on the left. 06/13/2016 -- had a left foot x-ray which showed no acute fracture or dislocation and no definite radiographic evidence of osteomyelitis. Advanced osteopenia was seen. 06/20/2016 -- she has noticed a new wound on the right plantar foot in the region where she had a callus before. 06/27/16- the patient did have her x-ray of the right foot which showed no findings to suggest osteomyelitis. She saw her endocrinologist, [name], yesterday. Her A1c in January was 11. He also indicates mismanagement and noncompliance regarding her diabetes. She is currently on Bactrim for a lip infection. She is complaining of nausea, vomiting and diarrhea. She is unable to articulate the exact orders or dosing of the Bactrim; it is unclear when she will  complete this. 07/04/2016 -- results from [facility] of ABIs with ankle waveforms were noted from 02/14/2016. The examination done on 06/27/2015 showed noncompressible ABIs with the right being 1.45 and the left being 1.33. The present examination showed a right ABI of 1.19 on the left of 1.33. The conclusion was that right normal ABI in the lower extremity at rest however compared to previous study which was noncompressible ABI may be falsely elevated side suggesting medial calcification. The left ABI suggested medial calcification. 08/01/2016 -- the patient had more  redness and pain on her right foot and did not get to come to see as noted she see her PCP or go to the ER and decided to take some leftover metronidazole which she had at home. As usual, the patient does report she feels and is rather noncompliant. 08/08/2016 -- -- x-ray of the right foot -- FINDINGS:Transmetatarsal amputation is noted. No bony destruction is noted to suggest osteomyelitis. IMPRESSION: No evidence of osteomyelitis. Postsurgical changes are seen. MRI would be more sensitive for possible bony changes. Culture has grown Serratia Marcescens -- sensitive to Bactrim, ciprofloxacin, ceftazidime she was seen by [name] on 08/06/2016. He did not find any exposed bone, muscle, tendon, ligament or joint. There was no malodor and he did a excisional debridement in the office. ============ Old notes: 49 year old patient who is known to the wound clinic for a while had been away from the wound clinic since 09/01/2014. Over the last several months she has been admitted to various hospitals including [facility]. She was treated for a right metatarsal osteomyelitis with a transmetatarsal amputation and this was done about 2 months ago. He has a small ulcerated area on the right heel and she continues to have an ulcerated area on the left plantar aspect of the foot. The patient was recently admitted to the  [facility] [facility] hospital group between 7/12 and 10/18/2014. she was given 3 weeks of IV vancomycin and was to follow-up with her surgeons at [facility] and also took oral vancomycin for C. difficile colitis. 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 MRI of the left foot done on 10/14/2014 showed no abscess or osteomyelitis. 04/27/15; this is a patient we know from previous stays in the wound care center. She is a type I diabetic I am not sure of her control currently. Since the last time I saw her she is had a right transmetatarsal amputation and has no wounds on her right foot and has no open wounds. She is been followed at the wound care center at [facility] in [place]. She comes today with the desire to undergo hyperbaric treatment locally. Apparently one of her wound care providers in [place] has suggested hyperbarics. This is in response to an MRI from 04/18/15 that showed increased marrow signal and loss of the proximal fifth metatarsal cortex evidence of osteomyelitis with likely early osteomyelitis in the cuboid bone as well. She has a large wound over the base of the fifth metatarsal. She also has a eschar over her the tips of her toes on 1,3 and 5. She does not have peripheral pulses and apparently is going for an angiogram tomorrow which seems reasonable. After this she is going to infectious disease at [facility]. They have been using Medihoney to the large wound on the lateral aspect of the left foot to. The patient has known Charcot deformity from diabetic neuropathy. She also has known diabetic PAD. Surprisingly I can't see that she has had any recent antibiotics, the patient states the last antibiotic  she had was at the end of November for 10 days. I think this was in response to culture that showed group G strep although I'm not exactly sure where the culture was from. She is also had arterial studies on 03/29/15. This showed a right  ABI of 1.4 that was noncompressible. Her left ABI was 0.73. 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 It was not felt that arterial inflow was adequate for healing of a foot ulcer. Her Doppler waveforms looked monophasic ===== READMISSION 02/28/17; this is in an now 49 year old woman we've had at several different occasions in this clinic. 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 She has a remote ex-smoker. She was last seen in this clinic by Dr. Con Memos I think in May. More recently she is been followed by her podiatrist [name] an infectious disease Dr. Megan Salon. She has 2 open wounds the major one is over the right first metatarsal head she also has a wound on the left plantar foot. an MRI of the right foot on 01/01/17 showed a soft tissue ulcer along the plantar aspect of the first metatarsal base consistent with osteomyelitis of the first metatarsal stump. Dr. 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 According to the patient this is been open for slightly over a month. She has been on a combination of Cipro 500 twice a day, Zyvox 600 twice a day and Flagyl 500 3 times a day for over a month now as directed by Dr. Megan Salon. cultures of the right foot earlier this year showed MRSA in January and Serratia in May. January also had a few viridans strep. Recent x-rays of both feet were done and [name] office and I don't have these reports. The patient has known PAD and has a history of aleft femoropopliteal bypass in April 2017. She underwent a right TMA in June 2016 and a left fifth ray amputation in April 2017 the patient has  an insulin pump and she works closely with her endocrinologist Dr. Dwyane Dee. In spite of this the last hemoglobin A1c I can see is 10.1 on 01/01/2017. She is being referred by 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 She is been using Medihoney to this area and also an area on the left midfoot. She is using healing sandals bilaterally. ABIs in this clinic at the left posterior tibial was 1.1 noncompressible on the right READMISSION Non invasive vascular NOVANT 5/18 Aftercare following surgery of the circulatory system Procedure Note - Interface, External Ris In - 08/13/2016 11:05 AM EDT Procedure: Examination consists of physiologic resting arterial pressures of the brachial and ankle arteries bilaterally with continuous wave Doppler waveform analysis. Previous: Previous exam performed on 02/14/16 demonstrated ABIs of Rt = 1.19 and Lt = 1.33. Right: ABI = non-compressible PT 1.47 DP. S/P transmet amputation. , Left: ABI = 1.52, 2nd digit pressure = 87 mmHg Conclusions: Right: ABI (>1.3) may be falsely elevated, suggesting medial calcification. Left: ABI (>1.3) may be falsely elevated, suggesting medial calcification The patient is a now 49 year old type I diabetic is had multiple issues her graded to chronic diabetic foot ulcers. 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 We had her in the clinic lastin November. At that point she had wounds on her bilateral feet.she had wanted to try hyperbarics however the healogics review process denied her because she hadn't followed up with her vascular surgeon for her left femoropopliteal bypass. The bypass was done by Dr. Raul Del at [facility]. We made her a follow-up with Dr. Raul Del  however she did not keep the appointment and therefore she was not  approved The patient shows me a small wound on her left fourth metatarsal head on her phone. She developed rapid discoloration in the plantar aspect of the left foot and she was admitted to hospital from 2/2 through 05/10/17 with wet gangrene of the left foot osteomyelitis of the fourth metatarsal heads. She was admitted acutely ill with a temperature of 103. She was started on broad-spectrum vancomycin and cefepime. On 05/06/17 she was taken to the OR by [name] her podiatric surgeon for an incision and drainage irrigation of the left foot wound. Cultures from this surgery revealed group be strep and anaerobes. she was seen by Dr.Xu of orthopedic surgery and scheduled for a below-knee amputation which she u refused. Ultimately she was discharged on Levaquin and Flagyl for one month. MRI 05/05/17 done while she was in the hospital showed abscess adjacent to the fourth metatarsal head and neck small abscess around the fourth flexor tendon. Inflammatory phlegmon and gas in the soft tissues along the lateral aspect of the fourth phalanx. Findings worrisome for osteomyelitis involving the fourth proximal and middle phalanx and also the third and fourth metatarsals. Finally the patient had actually shortly before this followed up with Dr. Raul Del at no time on 04/29/17. He felt that her left femoropopliteal bypass was patent he felt that her left-sided toe pressures more than adequate for healing a wound on the left foot. This was before her acute presentation. 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 05/28/17; she is started hyperbarics. The patient tells me that for some reason she was not actually on Levaquin but I think on ciprofloxacin. She was on Flagyl. She only started her Levaquin yesterday due to some difficulty with the pharmacy and perhaps her sister picking it up. She has an appointment with [name] tomorrow and with infectious disease early next week. She has no new complaints 06/06/17; the  patient continues in hyperbarics. She saw [name] on 05/29/17 who is her podiatric surgeon. He is elected for a transmetatarsal amputation on 06/27/17. I'm not sure at what level he plans to do this amputation. The patient is unaware ooShe also saw Dr. Megan Salon of infectious disease who elected to continue her on current antibiotics I think this is ciprofloxacin and Flagyl. I'll need to clarify with her tomorrow if she actually has this. We're using silver alginate to the actual wound. Necrotic surface today with material under the flap of her foot. ooOriginal MRI showed abscesses as well as osteomyelitis of the proximal and middle fourth phalanx and the third and fourth metatarsal heads 06/11/17; patient continues in hyperbarics and continues on oral antibiotics. She is doing well. The wound looks better. The necrotic part of this under the flap in her superior foot also looks better. she is been to see [name]. I haven't had a chance to look at his note. Apparently he has put the transmetatarsal amputation on hold her request it is still planning to take her to the OR for debridement and product application ACEL. I'll see if I can find his note. I'll therefore leave product ordering/requests to [name] for now. I was going to look at Dermagraft 06/18/17-she is here in follow-up evaluation for bilateral foot wounds. She continues with hyperbaric therapy. She states she has been applying manuka honey to the right plantar foot and alternate manuka honey and silver alginate to the left foot, despite our orders. We will continue with same  treatment plan and she will follo up next week. 06/25/17; I have reviewed [name] last note from 3/11. She has operative debridement in 2 days' time. By review his note apparently they're going to place there is skin over the majority of this wound which is a good choice. She has a small satellite area at the most proximal part of this wound on the left  plantar foot. The area on the right plantar foot we've been using silver alginate and it is close to healing. 07/02/17; unfortunately the patient was not easily approved for [name] proposed surgery. I'm not completely certain what the issue is. She has been using silver alginate to the wound she has completed a first course of hyperbarics. She is still on Levaquin and Flagyl. I have really lost track of the time course here.I suspect she should have another week to 2 of antibiotics. I'll need to see if she is followed up with infectious disease Dr. Megan Salon 07/09/17; the patient is followed up with Dr. Megan Salon. 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 She continues on Levaquin and metronidazole continuing both of these for now I think she is been on fr about 6 weeks. She still has some drainage but no pain. No fever. Her had been plans for her to go to the OR for operative debridement with her podiatrist [name], I am not exactly sure where that is. I'll probably slip a note to [name] today. I note that she follows with Dr. Dwyane Dee of endocrinology. We have her recertified for hyperbaric oxygen. I have not heard about Dermagraft however I'll see if [name] is planning a skin substitute as well 07/16/17; the patient tells me she is just about out of Kennedy. I'll need to check Dr. Hale Bogus last notes on this. She states she has plenty of Flagyl however. She comes in today complaining of pain in the right lateral foot which she said lasted for about a day. The wound on the right foot is actually much more medially. She also tells me that the [facility] cost a lot of pain in the left foot wound and she turned back to silver alginate. Finally Dermagraft has a $564 per application co-pay. She cannot afford this 07/23/17; patient arrives today with the wound not much smaller. There is not much new to add. She has not heard from [name] all try to put  in a call to them today. She was asking about Dermagraft again and she has an over $332 per application co-pay she states that she would be willing to try to do a payment plan. I been tried to avoid this. We've been using silver alginate, I'll change to [facility] 07/30/17-She is here in follow-up evaluation for left foot ulcer. She continues hyperbaric medicine. The left foot ulcer is stable we will continue with same treatment plan 08/06/17; she is here for evaluation of her left foot ulcer. Currently being treated for hyperbarics or underlying osteomyelitis. She is completed antibiotics. The left foot ulcer is better smaller with healthier looking granulation. For various reasons I am not really clear on we never got her back to the OR with [name]. He did not respond to my secure text message. Nevertheless I think that surgery on this point is not necessary nor am I completely clear that a skin substitute is necessary The patient is complaining about pain on the outside of her right foot. She's  had a previous transmetatarsal amputation here. There is no erythema. She also states the foot is warm versus her other part of her upper leg and this is largely true. It is not totally clear to me what's causing this. She thinks it's different from her usual neuropathy pain 08/13/17; she arrives in clinic today with a small wound which is superficial on her right first metatarsal head. 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 She tells [place] she was up on her feet over the Mother's Day celebration. ooThe large wound is on the left foot. Continues with hyperbarics for underlying osteomyelitis. We're using Hydrofera Blue. She asked me today about where we were with Dermagraft. I had actually excluded this because of the co-pay however she wants to assume this therefore I'll recheck the co-pay an order for next week. 08/20/17; the patient agreed to accept the co-pay of the first Apligraf  which we applied today. She is disappointed she is finishing hyperbarics will run this through the insurance on the extent of the foot infection and the extent of the wound that she had however she is already had 60 dive's. Dermagraft No. 1 08/27/17; Dermagraft No. 2. She is not eligible for any more hyperbaric treatments this month. She reports a fair amount of drainage and she actually changed to the external dressings without disturbing the direct contact layer 09/03/17; the patient arrived in clinic today with the wound superficially looking quite healthy. Nice vibrant red tissue with some advancing epithelialization although not as much adherence of the flap as I might like. However she noted on her own fourth toe some bogginess and she brought that to our attention. Indeed this was boggy feeling like a possibility of subcutaneous fluid. 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 She is not been unwell. We've been using Dermagraft 09/10/17; the culture that I did not last week was MRSA. She saw Dr. Megan Salon this morning who is going to start her on vancomycin. I had sent him a secure a text message yesterday. I also spoke with her podiatric surgeon [name] about surgery on this foot the options for conserving a functional foot etc. Promised me he would see her and will make back consultation today. Paradoxically her actual wound on the plantar aspect of her left foot looks really quite good. I had given her 5 days worth of Baxdella to cover her for MRSA. Her MRI came back showing osteomyelitis within the third metatarsal shaft and head and base of the third and fourth proximal phalanx. She had extensive inflammatory changes throughout the soft tissue of the lateral forefoot. With an ill-defined fluid around the fourth metatarsal extending into the plantar and dorsal soft tissues 09/19/17; the patient is actually on oral Septra  and Flagyl. She apparently refused IV vancomycin. She also saw [name] at my request who is planning her for a left BKA sometime in mid July. MRI showed osteomyelitis within the third metatarsal shaft and head and the basis of the third and fourth proximal phalanx. I believe there was felt to be possible septic arthritis involving the third MTP. 09/26/17; the patient went back to Dr. Megan Salon at my suggestion and is now receiving IV daptomycin. Her wound continues to look quite good making the decision to proceed with a transmetatarsal amputation although more difficult for the patient. I believe in my extensive discussions with her she has a  good sense of the pros and cons of this. I don't NV the tuft decision she has to make. She has an appointment with [name] I believe in mid July and I previously spoken to him about this issue Has we had used 3 previous Dermagraft. Given the condition of the wound surface I went ahead and added the fourth one today area and I did this not fully realizing that she'll be traveling to [place] next week. I'm hopeful she can come back in 2 weeks 10/21/17; Her same Dermagraft on for about 3-1/2 weeks. In spite of this the wound arrives looking quite healthy. There is been a lot of healing dimensions are smaller. Looking at the square shaped wound she has now there is some undermining and some depth medially under the undermining although I cannot palpate any bone. No surrounding infection is obvious. She has difficult questions about how to look at this going forward vis--vis amputations versus continued medical therapy. T be truthful the wound is looks so o healthy and it is continued to contract. Hard to justify foot surgery at this point although I still told her that I think it might come to that if we are not able to eradicate the underlying MRSA. She is still highly at risk and she understands this 11/06/17 on evaluation today patient appears to be doing  better in regard to her foot ulcer. She's been tolerating the dressing changes without complication. Currently she is here for her Dermagraft #6. Her wound continues to make excellent progress at this point. She does not appear to have any evidence of infection which is good news. 11/13/17 on evaluation today patient appears to be doing excellent at this time. She is here for repeat Dermagraft application. This is #7. Overall her wound seems to be making great progress. 12/05/17; the patient arrives with the wound in much better condition than when I last saw this almost 6 weeks ago. She still has a small probing area in the left metatarsal head region on the lateral aspect of her foot. We applied her last Dermagraft today. ooSince the last time she is here she has what appears to a been a blood blister on the plantar aspect of left foot although I don't see this is threatening. There is also a thick raised tissue on the right mid metatarsal head region. This was not there I don't think the last time she was here 3 weeks ago. 12/12/17; the patient continues to have a small programming area in the left metatarsal head region on the lateral aspect of her foot which was the initial large surgical wound. I applied her last Apligraf last week. I'm going to use Endoform starting today ooUnfortunately she has an excoriated area in the left mid foot and the right mid foot. The left midfoot looks like a blistered area this was not opened last week it certainly is open today. Using silver alginate on these areas. She promises me she is offloading this. 12/19/17; the small probing area in the left metatarsal head eyes think is shallower. In general her original wound looks better. We've been using Endoform. The area inferiorly that I think was trauma last week still requires debridement a lot of nonviable surface which I removed. She still has an open open area distally in her foot ooSimilarly on the right foot  there is tightly adherent surface debris which I removed. Still areas that don't look completely epithelialized. This is a small open area. We used silver  alginate on these areas 12/26/2017; the patient did not have the supplies we ordered from last week including the Endoform. The original large wound on the left lateral foot looks healthy. She still has the undermining area that is largely unchanged from last week. She has the same heavily callused raised edged wounds on the right mid and left midfoot. Both of these requiring debridement. We have been using silver alginate on these areas 01/02/2018; there is still supply issues. We are going to try to use Prisma but I am not sure she actually got it from what she is saying. She has a new open area on the lateral aspect of the left fourth toe [previous fifth ray amputation]. Still the one tunneling area over the fourth metatarsal head. The area is in the midfoot bilaterally still have thick callus around them. She is concerned about a raised swelling on the lateral aspect of the foot. However she is completely insensate 01/10/2018; we are using Prisma to the wounds on her bilateral feet. Surprisingly the tunneling area over the left fourth metatarsal head that was part of her original surgery has closed down. She has a small open area remaining on the incision line. 2 open areas in the midfoot. 02/10/2018; the patient arrives back in clinic after a month hiatus. She was traveling to visit family in [place]. Is fairly clear she was not offloading the areas on her feet. The original wound over the left lateral foot at the level of metatarsal heads is reopened and probes medially by about a centimeter or 2. She notes that a week ago she had purulent drainage come out of an area on the left midfoot. Paradoxically the worst area is actually on the right foot is extensive with purulent drainage. We will use silver alginate today 02/17/2018; the patient has  3 wounds one over the left lateral foot. She still has a small area over the metatarsal heads which is the remnant of her original surgical wound. This has medial probing depth of roughly 1.4 cm somewhat better than last week. The area on the right foot is larger. We have been using silver alginate to all areas. The area on the right foot and left foot that we cultured last week showed both Klebsiella and Proteus. Both of these are quinolone sensitive. The patient put her's self on Bactrim and Flagyl that she had left hanging around from prior antibiotic usages. She was apparently on this last week when she arrived. I did not realize this. Unfortunately the Bactrim will not cover either 1 of these organisms. We will send in Cipro 500 twice daily for a week 03/04/2018; the patient has 2 wounds on the left foot one is the original wound which was a surgical wound for a deep DFU. At one point this had exposed bone. She still has an area over the fourth metatarsal head that probes about 1.4 cm although I think this is better than last week. I been using silver nitrate to try and promote tissue adherence and been using silver alginate here. ooShe also has an area in the left midfoot. This has some depth but a small linear wound. Still requiring debridement. ooOn the right midfoot is a circular wound. A lot of thick callus around this area. ooWe have been using silver alginate to all wound areas ooShe is completed the ciprofloxacin I gave her 2 weeks ago. 03/11/2018; the patient continues to have 2 open areas on the left foot 1 of which was the  original surgical wound for a deep DFU. Only a small probing area remains although this is not much different from last week we have been using silver alginate. The other area is on the midfoot this is smaller linear but still with some depth. We have been using silver alginate here as well ooOn the right foot she has a small circular wound in the mid aspect.  This is not much smaller than last time. We have been using silver alginate here as well 03/18/2018; she has 3 wounds on the left foot the original surgical wound, a very superficial wound in the mid aspect and then finally the area in the mid plantar foot. She arrives in today with a very concerning area in the wound in the mid plantar foot which is her most proximal wound. There is undermining here of roughly 1-1/2 cm superiorly. Serosanguineous drainage. She tells me she had some pain on for over the weekend that shot up her foot into her thigh and she tells me that she had a nodule in the groin area. ooShe has the single wound in the right foot. ooWe are using endoform to both wound areas 03/24/2018; the patient arrives with the original surgical wound in the area on the left midfoot about the same as last week. There is a collection of fluid under the surface of the skin extending from the surgical wound towards the midfoot although it does not reach the midfoot wound. The area on the right foot is about the same. Cultures from last week of the left midfoot wound showed abundant Klebsiella abundant Enterococcus faecalis and moderate methicillin resistant staph I gave her Levaquin but this would have only covered the Klebsiella. She will need linezolid 04/01/2018; she is taking linezolid but for the first few days only took 1 a day. I have advised her to finish this at twice daily dosing. In any case all of her wounds are a lot better especially on the left foot. The original surgical wound is closed. The area on the left midfoot considerably smaller. The area on the right foot also smaller. 04/08/2018; her original surgical wound/osteomyelitis on the left foot remains closed. She has area on the left foot that is in the midfoot area but she had some streaking towards this. This is not connected with her original wound at least not visually. ooSmall wound on the right midfoot appears somewhat  smaller. 04/15/18; both wounds looks better. Original wound is better left midfoot. Using silver alginate 1/21; patient states she uses saltwater soak in, stones or remove callus from around her wounds. She is also concerned about a blood blister she had on the left foot but it simply resolved on its own. We've been using silver alginate 1/28; the patient arrives today with the same streaking area from her metatarsals laterally [the site of her original surgical wound] down to the middle of her foot. There is some drainage in the subcutaneous area here. This concerns me that there is actually continued ongoing infection in the metatarsals probably the fourth and third. This fixates an MRI of the foot without contrast [chronic renal failure] ooThe wound in the mid part of the foot is small but I wonder whether this area actually connects with the more distal foot. ooThe area on the right midfoot is probably about the same. Callus thick skin around the small wound which I removed with a curette we have been using silver alginate on both wound areas 2/4; culture I did of  the draining site on the left foot last time grew methicillin sensitive staph aureus. MRI of the left foot showed interval resolution of the findings surrounding the third metatarsal joint on the prior study consistent with treated osteomyelitis. Chronic soft tissue ulceration in the plantar and lateral aspect of the forefoot without residual focal fluid collection. No evidence of recurrent osteomyelitis. 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 She tells me she had nausea and vomiting over the weekend and missed 2 days. In general her foot looks much the same. She has a small open area just  below the left fourth metatarsal head. A linear area in the left midfoot. Some discoloration extending from the inferior part of this into the left lateral foot although this appears to be superficial. She has a small area on the right midfoot which generally looks smaller after debridement 2/18; the patient is completing his cephalexin and has another 2 days. She continues to have open areas on the left and right foot. 2/25; she is now off antibiotics. The area on the left foot at the site of her original surgical wound has closed yet again. She still has open areas in the mid part of her foot however these appear smaller. The area on the right mid foot looks about the same. We have been using silver alginate She tells me she had a serious hypoglycemic spell at home. She had to have EMS called and get IV dextrose 3/3; disappointing on the left lateral foot large area of necrotic tissue surrounding the linear area. This appears to track up towards the same original surgical wound. Required extensive debridement. The area on the right plantar foot is not a lot better also using silver 3/12; the culture I did last time showed abundant enterococcus. I have prescribed Augmentin, should cover any unrecognized anaerobes as well. In addition there were a few MRSA and Serratia that would not be well covered although I did not want to give her multiple antibiotics. She comes in today with a new wound in the right midfoot this is not connected with the original wound over her MTP a lot of thick callus tissue around both wounds but once again she said she is not walking on these areas 3/17-Patient comes in for follow-up on the bilateral plantar wounds, the right midfoot and the left plantar wound. Both these are heavily callused surrounding the wounds. We are continuing to use silver alginate, she is compliant with offloading and states she uses a wheelchair fairly often at home 3/24; both wound areas have thick  callus. However things actually look quite a bit better here for the majority of her left foot and the right foot. 3/31; patient continues to have thick callused somewhat irritated looking tissue around the wounds which individually are fairly superficial. There is no evidence of surrounding infection. We have been using silver alginate however I change that to [facility] today 4/17; patient returns to clinic after having a scare with Covid she tested negative in her primary doctor's office. She has been using Hydrofera Blue. She does not have an open area on the right foot. On the left foot she has a small open area with the mid area not completely viable. She showed me pictures of  what looks like a hemorrhagic blister from several days ago but that seems to have healed over this was on the lateral left foot 4/21; patient comes in to clinic with both her wounds on her feet closed. However over the weekend she started having pain in her right foot and leg up into the thigh. She felt as though she was running a low-grade fever but did not take her temperature. She took a doxycycline that she had leftover and yesterday a single Septra and metronidazole. She thinks things feel somewhat better. 4/28; duplex ultrasound I ordered last week was negative for DVT or superficial thrombophlebitis. She is completed the doxycycline I gave her. States she is still having a lot of pain in the right calf and right ankle which is no better than last week. She cannot sleep. She also states she has a temperature of up to 101, coughing and complaining of visual loss in her bilateral eyes. Apparently she was tested for Covid 2 weeks ago at [facility] and that was negative. Readmission: 09/03/18 patient presents back for reevaluation after having been evaluated at the end of April regarding erythema and swelling of her right lower extremity. Subsequently she ended up going to the hospital on 07/29/18 and was admitted not to be  discharged until 08/08/18. Unfortunately it was noted during the time that she was in the hospital that she did have methicillin-resistant Staphylococcus aureus as the infection noted at the site. It was also determined that she did have osteomyelitis which appears to be fairly significant. She was treated with vancomycin and in fact is still on IV vancomycin at dialysis currently. This is actually slated to continue until 09/12/18 at least which will be the completion of the six weeks of therapy. Nonetheless based on what I'm seeing at this point I'm not sure she will be anywhere near ready to discontinue antibiotics at that time. Since she was released from the hospital she was seen by [name] who is her podiatrist on 08/27/18. His note specifically states that he is recommended that the patient needs of one knee amputation on the right as she has a life- threatening situation that can lead quickly to sepsis. The patient advised she would like to try to save her leg to which [name] apparently told her that this was against all medical advice. She also want to discontinue the Wound VAC which had been initiated due to the fact that she wasn't pleased with how the wound was looking and subsequently she wanted to pursue applying Medihoney at that time. He stated that he did not believe that the right lower extremity was salvageable and that the patient understood but would still like to attempt hyperbaric option therapy if it could be of any benefit. She was therefore referred back to [place] for further evaluation. He plans to see her back next week. Upon inspection today patient has a significant amount purulent drainage noted from the wound at this point. The bone in the distal portion of her foot also appears to be extremely necrotic and spongy. When I push down on the bone it bubbles and seeps purulent drainage from deeper in the end of the foot. I do not think that this is likely going to heal very well  at all and less aggressive surgical debridement were undertaken more than what I believe we can likely do here in our office. 09/12/2018; I have not seen this patient since the most recent hospitalization although she was in our  clinic last week. I have reviewed some of her records from a complex hospitalization. 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 There is situation was complicated by MRSA bacteremia and acute on chronic renal failure now on dialysis. She is receiving vancomycin at dialysis. We started her on Dakin's wet-to-dry last week she is changing this daily. There is still purulent drainage coming out of her foot. Although she is apparently "agreeable" to a below-knee amputation which is been suggested by multiple clinicians she wants this to be done in [place]. She apparently has a telehealth visit with that provider sometime in late [place] 6/24. I have told her I think this is probably too long. Nevertheless I could not convince her to allow a local doctor to perform BKA. 09/19/2018; the patient has a large necrotic area on the right anterior foot. She has had previous transmetatarsal amputations. Culture I did last week showed MRSA nothing else she is on vancomycin at dialysis. She has continued leaking purulent drainage out of the distal part of the large circular wound on the right anterior foot. She apparently went to see Dr. 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 Somehow that translated into her being referred to plastic surgery for debridement of the area. I gather she basically refused amputation although I do not have a copy of Dr. Berenice Primas notes. The patient really wants to have a trial of hyperbaric oxygen. I agreed with initial assessment in this clinic that this was probably too far along to benefit however if she is going to have plastic surgery I think she would benefit from ancillary  hyperbaric oxygen. The issue here is that the patient has benefited as maximally as any patient I have ever seen from hyperbaric oxygen therapy. Most recently she had exposed bone on the lateral part of her left foot after a surgical procedure and that actually has closed. She has eschared areas in both heels but no open area. She is remained systemically well. I am not optimistic that anything can be done about this but the patient is very clear that she wants an attempt. The attempt would include a wound VAC further debridements and hyperbaric oxygen along with IV antibiotics. 6/26; I put her in for a trial of hyperbaric oxygen only because of the dramatic response she has had with wounds on her left midfoot earlier this year which was a surgical wound that went straight to her bone over the metatarsal heads and also remotely the left third toe. We will see if we can get this through our review process and insurance. She arrives in clinic with again purulent material pouring out of necrotic bone on the top of the foot distally. There is also some concerning erythema on the front of the leg that we marked. It is bit difficult to tell how tender this is because of neuropathy. I note from infectious disease that she had her vancomycin extended. 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 She had the wound VAC on for part of the week. The rest of the time she is putting various things on this including Medihoney, "ionized water" silver sorb gel etc. 7/7; follow-up along with HBO. She is still on vancomycin at dialysis. She has a large open area on the dorsal right foot and a small dark eschar area on her heel. There is a lot less erythema in the area and a lot less tenderness.  From an infection point of view I think this is better. She still has a lot of necrosis in the remaining right forefoot [previous TMA] we are still using the wound VAC in this area 7/16; follow-up  along with HBO. I put her on linezolid after she finished her vancomycin. We started this last Friday I gave her 2 weeks worth. I had the expectation that she would be operatively debrided by Dr. Marla Roe but that still has not happened yet. Patient phoned the office this week. She arrives for review today after HBO. The distal part of this wound is completely necrotic. Nonviable pieces of tendon bone was still purulent drainage. Also concerning that she has black eschar over the heel that is expanding. I think this may be indicative of infection in this area as well. She has less erythema and warmth in the ankle and calf but still an abnormal exam 7/21 follow-up along with HBO. I will renew her linezolid after checking a CBC with differential monitoring her blood counts especially her platelets. She was supposed to have surgery yesterday but if I am reading things correctly this was canceled after her blood sugar was found to be over 500. I thought Dr. Marla Roe who called me said that they were sending her to the ER but the patient states that was not the case. 7/28. Follow-up along with HBO. She is on linezolid I still do not have any lab work from dialysis even though I called last week. The patient is concerned about an area on her left lateral foot about the level of the base of her fifth metatarsal. I did not really see anything that ominous here however this patient is in [place] ability to point out problems that she is sensing and she has been accurate in the past Finally she received a call from Dr. Marla Roe who is referring her to another orthopedic surgeon stating that she is too booked up to take her to the operating room now. Was still using a wound VAC on the foot 8/3 -Follow-up after HBO, she is got another week of linezolid, she is to call [facility] for an appointment, x-rays of both feet were reviewed, the left foot x-ray with third MTP joint osteo- Right foot x-ray widespread osteo-in  the right midfoot Right ankle x-ray does not show any active evidence of infection 8/11-Patient is seen after HBO, the wounds on the right foot appear to be about the same, the heel wound had some necrotic base over tendon that was debrided with a curette 8/21; patient is seen after HBO. The patient's wound on her dorsal foot actually looks reasonably good and there is substantial amount of epithelialization however the open area distally still has a lot of necrotic debris partially bone. I cannot really get a good sense of just how deep this probes under the foot. She has been pressuring me this week to order medical maggots through a company in [place] for her. The problem I have is there is not a defined wound area here. On the positive side there is no purulence. She has been to see infectious disease she is still on Septra DS although I have not had a chance to review their notes 8/28; patient is seen in conjunction with HBO. The wounds on her foot continued to improve including the right dorsal foot substantially the, the distal part of this wound and the area on the right heel. We have been using a wound VAC over this chronically. She is  still on trimethoprim as directed by infectious disease 9/4; patient is seen in conjunction with HBO. Right dorsal foot wound substantially anteriorly is better however she continues to have a deep wound in the distal part of this that is not responding. We have been using silver collagen under border foam ooArea on the right plantar medial heel seems better. We have been using Hydrofera Blue 12/12/18 on evaluation today patient appears to be doing about the same with regard to her wound based on prior measurements. She does have some necrotic tissue noted on the lateral aspect of the wound that is going require a little bit of sharp debridement today. This includes what appears to be potentially either severely necrotic bone or tendon. Nonetheless other  than that she does not appear to have any severe infection which is good news 9/18; it is been 2 weeks since I saw this wound. She is tolerating HBO well. Continued dramatic improvement in the area on the right dorsal foot. She still has a small wound on the heel that we have been using Hydrofera Blue. She continues with a wound VAC 9/24; patient has to be seen emergently today with a swelling on her right lateral lower leg. She says that she told Dr. Evette Doffing about this and also myself on a couple of occasions but I really have no recollection of this. She is not systemically unwell and her wound really looked good the last time I saw this. She showed this to providers at dialysis and she was able to verify that she was started on cephalexin today for 5 doses at dialysis. She dialyzes on Tuesday Thursday and Saturday. 10/2; patient is seen in conjunction with HBO. The area that is draining on the right anterior medial tibia is more extensive. Copious amounts of serosanguineous drainage with some purulence. We are still using the wound VAC on the original wound then it is stable. Culture I did of the original IandD showed MRSA I contacted dialysis she is now on vancomycin with dialysis treatments. I asked them to run a month 10/9; patient seen in conjunction with HBO. She had a new spontaneous open area just above the wound on the right medial tibia ankle. More swelling on the right medial tibia. Her wound on the foot looks about the same perhaps slightly better. There is no warmth spreading up her leg but no obvious erythema. her MRI of the foot and ankle and distal tib-fib is not booked for next Friday I discussed this with her in great detail over multiple days. it is likely she has spreading infection upper leg at least involving the distal 25% above the ankle. She knows that if I refer her to orthopedics for infectious disease they are going to recommend amputation and indeed I am not against this  myself. We had a good trial at trying to heal the foot which is what she wanted along with antibiotics debridement and HBO however she clearly has spreading infection [probably staph aureus/MRSA]. Nevertheless she once again tells me she wants to wait the left of the MRI. She still makes comments about having her amputation done in [place]. 10/19; arrives today with significant swelling on the lateral right leg. Last culture I did showed Klebsiella. Multidrug-resistant. Cipro was intermediate sensitivity and that is what I have her on pending her MRI which apparently is going to be done on Thursday this week although this seems to be moving back and forth. She is not systemically unwell. We are using  silver alginate on her major wound area on the right medial foot and the draining areas on the right lateral lower leg 10/26; MRI showed extensive abscess in the anterior compartment of the right leg also widespread osteomyelitis involving osseous structures of the midfoot and portions of the hindfoot. Also suspicion for osteomyelitis anterior aspect of the distal medial malleolus. Culture I did of the purulence once again showed a multidrug-resistant Klebsiella. I have been in contact with nephrology late last week and she has been started on cefepime at dialysis to replace the vancomycin We sent a copy of her MRI report to Dr. Geroge Baseman in [place] who is an orthopedic surgeon. The patient takes great stock in his opinion on this. She says she will go to [place] to have her leg amputated if Dr. Geroge Baseman does not feel there is any salvage options. 11/2; she still is not talk to her orthopedic surgeon in [place]. Apparently he will call her at [phone] this afternoon. The quality of this is she has not allowed me to refer her anywhere. She has been told over and over that she needs this amputated but has not agreed to be referred. She tells me her blood sugar was 600 last night but she has not been  febrile. 11/9; she never did got a call from the orthopedic surgeon in [place] therefore that is off the radar. We have arranged to get her see orthopedic surgery at [facility]. She still has a lot of draining purulence coming out of the new abscess in her right leg although that probably came from the osteomyelitis in her right foot and heel. Meanwhile the original wound on the right foot looks very healthy. Continued improvement. The issue is that the last MRI showed osteomyelitis in her right foot extensively she now has an abscess in the right anterior lower leg. There is nobody in [place] who will offer this woman anything but an amputation and to be honest that is probably what she needs. I think she still wants to talk about limb salvage although at this point I just do not see that. She has completed her vancomycin at dialysis which was for the original staph aureus she is still on cefepime for the more recent Klebsiella. She has had a long course of both of these antibiotics which should have benefited the osteomyelitis on the right foot as well as the abscess. 11/16; apparently [place] elective surgery is shut down because of COVID-19 pandemic. I have reached out to some contacts at [facility] to see if we can get her an orthopedic appointment there. I am concerned about continually leaving this but for the moment everything is static. In fact her original large wound on this foot is closing down. It is the abscess on the right anterior leg that continues to drain purulent serosanguineous material. She is not currently on any antibiotics however she had a prolonged course of vancomycin [1 month] as well as cefepime for a month 02/24/2019 on evaluation today patient appears to be doing better than the last time I saw her. This is not a patient that I typically see. With that being said I am covering for Dr. Dellia Nims this week and again compared to when I last saw her overall the wounds in  particular seem to be doing significantly better which is good news. With that being said the patient tells me several disconcerting things. She has not been able to get in to see anyone for potential debridement in regard to her  leg wounds although she tells me that she does not think it is necessary any longer because she is taking care of that herself. She noticed a string coming out of the lower wound on her leg over the last week. The patient states that she subsequently decided that we must of pack something in there and started pulling the string out and as it kept coming and coming she realized this was likely her tendon. With that being said she continued to remove as much of this as she could. She then I subsequently proceeded to using tubes of antibiotic ointment which she will stick down into the wound and then scored as much as she can until she sees it coming out of the other wound opening. She states that in doing this she is actually made things better and there is less redness and irritation. With regard to her foot wound she does have some necrotic tendon and tissue noted in one small corner but again the actual wound itself seems to be doing better with good granulation in general compared to my last evaluation. 12/7; continued improvement in the wound on the substantial part of the right medial foot. Still a necrotic area inferiorly that required debridement but the rest of this looks very healthy and is contracting. She has 2 wounds on the right lateral leg which were her original drainage sites from her abscess but all of this looks a lot better as well. She has been using silver alginate after putting antibiotic biotic ointment in one wound and watching it come out the other. I have talked to her in some detail today. I had given her names of orthopedic surgeons at [facility] for second opinion on what to do about the right leg. I do not think the patient never called them. She has  not been able to get a hold of the orthopedic surgeon in [place] that she had put a lot of faith in as being somebody would give her an opinion that she would trust. I talked to her today and said even if I could get her in to another orthopedic surgeon about the leg which she accept an amputation and she said she would not therefore I am not going to press this issue for the moment 12/14; continued improvement in his substantial wound on the right medial foot. There is still a necrotic area inferiorly with tightly adherent necrotic debris which I have been working on debriding each time she is here. She does not have an orthopedic appointment. Since last time she was here I looked over her cultures which were essentially MRSA on the foot wound and gram-negative rods in the abscess on the anterior leg. 12/21; continued improvement in the area on the right medial foot. She is not up on this much and that is probably a good thing since I do not know it could support continuous ambulation. She has a small area on the right lateral leg which were remanence of the IandD's I did because of the abscess. I think she should probably have prophylactic antibiotics I am going to have to look this over to see if we can make an intelligent decision here. In the meantime her major wound is come down nicely. Necrotic area inferiorly is still there but looks a lot better 04/06/2019; she has had some improvement in the overall surface area on the right medial foot somewhat narrowedr both but somewhat longer. The areas on the right lateral leg which were initial  IandD sites are superficial. Nothing is present on the right heel. We are using silver alginate to the wound areas 1/18; right medial foot somewhat smaller. Still a deep probing area in the most distal recess of the wound. She has nothing open on the right leg. She has a new wound on the plantar aspect of her left fourth toe which may have come from just  pulling skin. The patient using Medihoney on the wound on her foot under silver alginate. I cannot discourage her from this 2/1; 2-week follow-up using silver alginate on the right foot and her left fourth toe. The area on the right dorsal foot is contracted although there is still the deep area in the most distal part of the wound but still has some probing depth. No overt infection 2/15; 2-week follow-up. She continues to have improvement in the surface area on the dorsal right foot. Even the tunneling area from last time is almost closed. The area that was on the plantar part of her left fourth toe over the PIP is indeed closed 3/1; 2-week follow-up. Continued improvement in surface area. The original divot that we have been debriding inferiorly I think has full epithelialization although the epithelialization is gone down into the wound with probably 4 mm of depth. Even under intense illumination I am unable to see anything open here. The remanence of the wound in this area actually look quite healthy. We have been using silver alginate 3/15; 2-week follow-up. Unfortunately not as good today. She has a comma shaped wound on the dorsal foot however the upper part of this is larger. Under illumination debris on the surface She also tells [place] that she was on her right leg 2 times in the last couple of weeks mostly to reach up for things above her head etc. She felt a sharp pain in the right leg which she thinks is somewhere from the ankle to the knee. The patient has neuropathy and is really uncertain. She cannot feel her foot so she does not think it was coming from there 3/29; 2-week follow-up. Her wound measures smaller. Surface of the wound appears reasonable. She is using silver alginate with underlying Medihoney. She has home health. X-rays I did of her tib-fib last time were negative although it did show arterial calcification 4/12; 2-week follow-up. Her wound measures smaller in length. Using  manuka honey with silver alginate on top. She has home health. 4/26; 2-week follow-up. Her wound is smaller but still very adherent debris under illumination requiring debridement she has been using manuka honey with silver alginate. 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 Been using Hydrofera Blue. She is on suppressive Bactrim but apparently she has been taking it twice daily 6/7; I have not seen this wound and about 6 weeks. Since then she was up in [place]. By her own admission she was walking on the foot because she did not have a wheelchair. The wound is not nearly as healthy looking as it was the last time I saw this. We ordered different things for her but she only uses Medihoney and silver alginate. As far as I know she is on suppressive trimethoprim sulfamethoxazole. She does not admit to any fever or chills. Her CBGs apparently are at baseline however she is saying that she feels some discomfort on the lateral part of her ankle I looked over her  last inflammatory markers from the summer 2020 at which time she had a deeply necrotic infected wound in this area. On 11/10/2018 her sedimentation rate was 56 and C-reactive protein 9.9. This was 107 and 29 on 07/29/2018. 6/17; the patient had a necrotic wound the last time she was here on the right dorsal foot. After debridement I did a culture. This showed a very resistant ESBL Klebsiella as well as Enterococcus. Her x-ray of the foot which was done because of warmth and some discomfort showed bone destruction within the carpal bones involving the navicular acute cuboid lateral middle cuneiforms but essentially unchanged from her prior study which was done on 10/29/2018. The findings were felt to represent chronic osteomyelitis. We did inflammatory markers on her. Her white count was 5.25 sedimentation rate 16 and C-reactive protein at 11.1.  Notable for the fact that in August 2020 her CRP was 9.9 and sedimentation rate 56. I have looked at her x-rays. It is true that the bone destruction is very impressive however the patient came into this clinic for the wound on her right foot with pieces of bone literally falling out anteriorly with purulent material. I am not exactly sure I could have expected anything different. She has not been systemically unwell no fever chills or blood sugars have been reasonable. 6/28; she arrives with a right heel closed. The substantial area on the right anterior foot looks healthy. Much better looking surface. I think we can change to [facility] seems to help this previously. She is getting her antibiotics at dialysis she should be just about finished 7/9; changed to [facility] last week. Surface wound looks satisfactory not much change in surface area however. She is going to [place] state next week this is usually a difficult thing for this patient follow-up will be for 2 weeks. 7/23; using Hydrofera Blue. She returns from her trip and the wound looks surprisingly good. Usually when this patient goes on trips she comes back with a lot of problems with the wound. She is saying that she sometimes feels an episodic "crunching" feeling on the lateral part of the foot. She is neuropathic and not feeling pain but wonders whether this could be a neuropathic dysesthesia. 11/13/19-Patient returns after 3 weeks, the wound itself is stable and patient states that there is nothing new going on she is on some extra anxiety medications and is resisting the temptation to pick at the dry skin around the wound. 9/20; patient has not been here in over a month and I have not seen her in 2 months. The wound in terms of size I think is about the same. There is no exposed bone. She has a nonviable surface on this. She is supposed to be using [facility] however she is also been using some form of honey preparation  as well as a silver-based dressings. I do not think she has any pattern to this. Objective Constitutional Patient is hypertensive.. Pulse regular and within target range for patient.Marland Kitchen Respirations regular, non-labored and within target range.. Temperature is normal and within the target range for the patient.Marland Kitchen Appears in no distress. Vitals Time Taken: 1:15 PM, Height: 67 in, Weight: 125 lbs, BMI: 19.6, Temperature: 98.2 F, Pulse: 79 bpm, Respiratory Rate: 18 breaths/min, Blood Pressure: 182/95 mmHg. Cardiovascular Pedal pulses are palpable on both sides. General Notes: Wound exam; right transmetatarsal amputation site wound with previous osteomyelitis. The wound looks stable in terms of size. Under illumination and nonviable surface. Yet I used an  open curette to remove this certainly not easy. Bleeding controlled with direct pressure. Her peripheral pulses are palpable Integumentary (Hair, Skin) Wound #43 status is Open. Original cause of wound was Gradually Appeared. The wound is located on the Right,Medial Foot. The wound measures 4.8cm length x 2.9cm width x 0.1cm depth; 10.933cm^2 area and 1.093cm^3 volume. There is Fat Layer (Subcutaneous Tissue) exposed. There is no tunneling or undermining noted. There is a medium amount of serosanguineous drainage noted. The wound margin is flat and intact. There is medium (34-66%) pink granulation within the wound bed. There is a medium (34-66%) amount of necrotic tissue within the wound bed including Adherent Slough. 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 There was a Excisional Skin/Subcutaneous Tissue Debridement with a total area of 13.92 sq cm performed by Ricard Dillon., MD. With the following instrument(s): Curette to remove Viable and Non-Viable tissue/material. Material removed includes Subcutaneous Tissue and Slough and. No specimens were taken. A time out was conducted at 13:50, prior to the start of the procedure. A Minimum amount of bleeding was controlled with Pressure. The procedure was tolerated well with a pain level of 0 throughout and a pain level of 0 following the procedure. Post Debridement Measurements: 4.8cm length x 2.9cm width x 0.1cm depth; 1.093cm^3 volume. Character of Wound/Ulcer Post Debridement is improved. Severity of Tissue Post Debridement is: Fat layer exposed. Post procedure Diagnosis Wound #43: Same as Pre-Procedure Plan Follow-up Appointments: Return Appointment in 2 weeks. Dressing Change Frequency: Wound #43 Right,Medial Foot: Change Dressing every other day. Wound Cleansing: Wound #43 Right,Medial Foot: Clean wound with Wound Cleanser - or normal saline Primary Wound Dressing: Wound #43 Right,Medial Foot: Medihoney Alginate Secondary Dressing: Wound #43 Right,Medial Foot: Kerlix/Rolled Gauze Dry Gauze Edema Control: Elevate legs to the level of the heart or above for 30 minutes daily and/or when sitting, a frequency of: - throughout the day Home Health: [facility] for wound care. - Interim 1. I have asked the patient to use a steady dressing for a couple of weeks. It is hard to know what is working and what is not working here. In view of her preference for honey products we use Medihoney alginate. 2. I see no evidence of infection 3. She says she stubbed her left foot 5 or 6 days ago. The area hurt initially however I cannot reduce reproduce any pain. No worrisome suggestion of a fracture Electronic  Signature(s) Signed: 12/22/2019 4:19:57 PM By: Linton Ham MD Entered By: Linton Ham on 12/21/2019 14:11:56 -------------------------------------------------------------------------------- SuperBill Details Patient Name: Date of Service: [place] L[place] [address] [name] 12/21/2019 Medical Record Number: [id number] Patient Account Number: [id number] Date of Birth/Sex: Treating RN: [birth year] (49 y.o. Nancy Fetter Primary Care Provider: [name], [name] Other Clinician: Referring Provider: Treating Provider/Extender: [name], [name] Weeks in Treatment: 67 Diagnosis Coding ICD-10 Codes Code Description [id number] Other chronic osteomyelitis, right ankle and foot L97.514 Non-pressure chronic ulcer of other part of right foot with necrosis of bone L97.411 Non-pressure chronic ulcer of right heel and midfoot limited to breakdown  of skin E10.621 Type 1 diabetes mellitus with foot ulcer Facility Procedures CPT4 Code: [id number] Description: 16384 - DEB SUBQ TISSUE 20 SQ CM/< ICD-10 Diagnosis Description M86.671 Other chronic osteomyelitis, right ankle and foot L97.514 Non-pressure chronic ulcer of other part of right foot with necrosis of bone Modifier: Quantity: 1 Physician Procedures Electronic Signature(s) Signed: 12/22/2019 4:19:57 PM By: Linton Ham MD Entered By: Linton Ham on 12/21/2019 14:12:20

## 2019-12-22 NOTE — Progress Notes (Signed)
[name], [name] ([id number]) Visit Report for 12/21/2019 Arrival Information Details Patient Name: Date of Service: [place] LLO [name] 12/21/2019 12:45 PM Medical Record Number: [id number] Patient Account Number: [id number] Date of Birth/Sex: Treating RN: Aug 22, 1970 (48 y.o. Nancy Fetter Primary Care [place]ford Staheli: [name], [name] Other Clinician: Referring Dessirae Scarola: Treating Tayt Moyers/Extender: [name], [name] Weeks in Treatment: 55 Visit Information History Since Last Visit Added or deleted any medications: No Patient Arrived: Wheel Chair Any new allergies or adverse reactions: No Arrival Time: 13:06 Had a fall or experienced change in No Accompanied By: self activities of daily living that may affect Transfer Assistance: None risk of falls: Patient Identification Verified: Yes Signs or symptoms of abuse/neglect since last visito No Secondary Verification Process Completed: Yes Hospitalized since last visit: No Patient Requires Transmission-Based Precautions: No Implantable device outside of the clinic excluding No Patient Has Alerts: No cellular tissue based products placed in the center since last visit: Has Dressing in Place as Prescribed: Yes Pain Present Now: No Electronic Signature(s) Signed: 12/22/2019 8:07:58 AM By: Sandre Kitty Entered By: Sandre Kitty on 12/21/2019 13:14:49 -------------------------------------------------------------------------------- Encounter Discharge Information Details Patient Name: Date of Service: [place] LLO [address] Nancy Port [name] 12/21/2019 12:45 PM Medical Record Number: [id number] Patient Account Number: [id number] Date of Birth/Sex: Treating RN: 1970-05-27 (49 y.o. [name] Primary Care Devarius Nelles: [name], [name] Other Clinician: Referring Murl Golladay: Treating Maika Kaczmarek/Extender: [name], [name] Weeks in Treatment: 42 Encounter Discharge Information Items Post Procedure Vitals Discharge  Condition: Stable Temperature (F): 98.2 Ambulatory Status: Wheelchair Pulse (bpm): 79 Discharge Destination: Home Respiratory Rate (breaths/min): 18 Transportation: Private Auto Blood Pressure (mmHg): 182/95 Accompanied By: self Schedule Follow-up Appointment: Yes Clinical Summary of Care: Electronic Signature(s) Signed: 12/21/2019 5:10:33 PM By: Deon Pilling Entered By: Deon Pilling on 12/21/2019 14:08:24 -------------------------------------------------------------------------------- Lower Extremity Assessment Details Patient Name: Date of Service: [name] 12/21/2019 12:45 PM Medical Record Number: [id number] Patient Account Number: [id number] Date of Birth/Sex: Treating RN: 1970/04/24 (49 y.o. [name] Primary Care [name]: [name], [name] Other Clinician: Referring Netanel Yannuzzi: Treating [name]/Extender: [name], [name] Weeks in Treatment: 67 Edema Assessment Assessed: [Left: No] [Right: Yes] Edema: [Left: N] [Right: o] Calf Left: Right: Point of Measurement: cm From Medial Instep cm 27 cm Ankle Left: Right: Point of Measurement: cm From Medial Instep cm 19.5 cm Electronic Signature(s) Signed: 12/21/2019 5:10:33 PM By: Deon Pilling Entered By: Deon Pilling on 12/21/2019 13:28:34 -------------------------------------------------------------------------------- Multi Wound Chart Details Patient Name: Date of Service: [place] LLO Nancy Pao [name] 12/21/2019 12:45 PM Medical Record Number: [id number] Patient Account Number: [id number] Date of Birth/Sex: Treating RN: May 31, 1970 (49 y.o. Nancy Fetter Primary Care Amare Kontos: [name], [name] Other Clinician: Referring Jenesis Martin: Treating Campbell Kray/Extender: [name], [name] Weeks in Treatment: 84 Vital Signs Height(in): 68 Pulse(bpm): 7 Weight(lbs): 125 Blood Pressure(mmHg): 182/95 Body Mass Index(BMI): 20 Temperature(F): 98.2 Respiratory  Rate(breaths/min): 18 Photos: [43:No Photos Right, Medial Foot] [N/A:N/A N/A] Wound Location: [43:Gradually Appeared] [N/A:N/A] Wounding Event: [43:Diabetic Wound/Ulcer of the Lower] [N/A:N/A] Primary Etiology: [43:Extremity Cataracts, Chronic sinus] [N/A:N/A] Comorbid History: [43:problems/congestion, Anemia, Sleep Apnea, Deep Vein Thrombosis, Hypertension, Peripheral Arterial Disease, Type I Diabetes, Osteoarthritis, Osteomyelitis, Neuropathy, Seizure Disorder 08/04/2018] [N/A:N/A] Date Acquired: [43:67] [N/A:N/A] Weeks of Treatment: [43:Open] [N/A:N/A] Wound Status: [43:Yes] [N/A:N/A] Clustered Wound: [43:4.8x2.9x0.1] [N/A:N/A] Measurements L x W x D (cm) [02:40.973] [N/A:N/A] A (cm) : rea [43:1.093] [  N/A:N/A] Volume (cm) : [43:84.90%] [N/A:N/A] % Reduction in A rea: [43:95.00%] [N/A:N/A] % Reduction in Volume: [43:Grade 4] [N/A:N/A] Classification: [43:Medium] [N/A:N/A] Exudate A mount: [43:Serosanguineous] [N/A:N/A] Exudate Type: [43:red, brown] [N/A:N/A] Exudate Color: [43:Flat and Intact] [N/A:N/A] Wound Margin: [43:Medium (34-66%)] [N/A:N/A] Granulation A mount: [43:Pink] [N/A:N/A] Granulation Quality: [43:Medium (34-66%)] [N/A:N/A] Necrotic A mount: [43:Fat Layer (Subcutaneous Tissue): Yes N/A] Exposed Structures: [43:Fascia: No Tendon: No Muscle: No Joint: No Bone: No Small (1-33%)] [N/A:N/A] Epithelialization: [43:Debridement - Excisional] [N/A:N/A] Debridement: Pre-procedure Verification/Time Out 13:50 [N/A:N/A] Taken: [43:Subcutaneous, Slough] [N/A:N/A] Tissue Debrided: [43:Skin/Subcutaneous Tissue] [N/A:N/A] Level: [43:13.92] [N/A:N/A] Debridement A (sq cm): [43:rea Curette] [N/A:N/A] Instrument: [43:Minimum] [N/A:N/A] Bleeding: [43:Pressure] [N/A:N/A] Hemostasis A chieved: [43:0] [N/A:N/A] Procedural Pain: [43:0] [N/A:N/A] Post Procedural Pain: [43:Procedure was tolerated well] [N/A:N/A] Debridement Treatment Response: [43:4.8x2.9x0.1] [N/A:N/A] Post Debridement  Measurements L x W x D (cm) [43:1.093] [N/A:N/A] Post Debridement Volume: (cm) [43:Debridement] [N/A:N/A] Treatment Notes Electronic Signature(s) Signed: 12/21/2019 5:09:36 PM By: [name] RN, BSN Signed: 12/22/2019 4:19:57 PM By: Linton Ham MD Entered By: Linton Ham on 12/21/2019 14:06:45 -------------------------------------------------------------------------------- Multi-Disciplinary Care Plan Details Patient Name: Date of Service: [place] LLO [address] Nancy Port [name] 12/21/2019 12:45 PM Medical Record Number: [id number] Patient Account Number: [id number] Date of Birth/Sex: Treating RN: [birth year] (49 y.o. Nancy Fetter Primary Care Corbett Moulder: [name], [name] Other Clinician: Referring Laurette Villescas: Treating Tad Fancher/Extender: [name], [name] Weeks in Treatment: 38 Active Inactive Wound/Skin Impairment Nursing Diagnoses: Impaired tissue integrity Knowledge deficit related to ulceration/compromised skin integrity Goals: Patient/caregiver will verbalize understanding of skin care regimen Date Initiated: 09/03/2018 Target Resolution Date: 01/22/2020 Goal Status: Active Ulcer/skin breakdown will have a volume reduction of 30% by week 4 Date Initiated: 09/03/2018 Date Inactivated: 10/07/2018 Target Resolution Date: 10/01/2018 Goal Status: Unmet Unmet Reason: COMORBITIES Ulcer/skin breakdown will have a volume reduction of 50% by week 8 Date Initiated: 10/07/2018 Date Inactivated: 11/03/2018 Target Resolution Date: 10/31/2018 Goal Status: Unmet Unmet Reason: Osteomyelitis Interventions: Assess patient/caregiver ability to obtain necessary supplies Assess patient/caregiver ability to perform ulcer/skin care regimen upon admission and as needed Assess ulceration(s) every visit Provide education on ulcer and skin care Treatment Activities: Skin care regimen initiated : 09/03/2018 Topical wound management initiated : 09/03/2018 Notes: Electronic Signature(s) Signed:  12/21/2019 5:09:36 PM By: [name] RN, BSN Entered By: [name] on 12/21/2019 13:52:10 -------------------------------------------------------------------------------- Pain Assessment Details Patient Name: Date of Service: [name] 12/21/2019 12:45 PM Medical Record Number: [id number] Patient Account Number: [id number] Date of Birth/Sex: Treating RN: 1970/05/17 (49 y.o. Nancy Fetter Primary Care Steffon Gladu: [name], [name] Other Clinician: Referring Albion Weatherholtz: Treating Lekesha Claw/Extender: [name], [name] Weeks in Treatment: 26 Active Problems Location of Pain Severity and Description of Pain Patient Has Paino No Site Locations Pain Management and Medication Current Pain Management: Electronic Signature(s) Signed: 12/21/2019 5:09:36 PM By: [name] RN, BSN Signed: 12/22/2019 8:07:58 AM By: Sandre Kitty Entered By: Sandre Kitty on 12/21/2019 13:15:50 -------------------------------------------------------------------------------- Patient/Caregiver Education Details Patient Name: Date of Service: [place] LLO [place] Nancy Port [name] 9/20/2021andnbsp12:45 PM Medical Record Number: [id number] Patient Account Number: [id number] Date of Birth/Gender: Treating RN: 1971/01/05 (49 y.o. Nancy Fetter Primary Care Physician: [name], [name] Other Clinician: Referring Physician: Treating Physician/Extender: [name], [name] Weeks in Treatment: 58 Education Assessment Education Provided To: Patient Education Topics Provided Wound/Skin Impairment: Methods: Explain/Verbal Responses: State content correctly [employer]) Signed: 12/21/2019 5:09:36 PM By: [name] RN, BSN Entered By:  [name] on 12/21/2019 13:52:24 -------------------------------------------------------------------------------- Wound Assessment Details Patient Name: Date of Service: [place] LLO [name] 12/21/2019 12:45  PM Medical Record Number: [id number] Patient Account Number: [id number] Date of Birth/Sex: Treating RN: 07-Jul-1970 (49 y.o. Nancy Fetter Primary Care Trinton Prewitt: [name], [name] Other Clinician: Referring Thomasene Dubow: Treating Piper Hassebrock/Extender: [name], [name] Weeks in Treatment: 67 Wound Status Wound Number: [id number] Primary Diabetic Wound/Ulcer of the Lower Extremity Etiology: Wound Location: Right, Medial Foot Wound Open Wounding Event: Gradually Appeared Status: Date Acquired: 08/04/2018 Comorbid Cataracts, Chronic sinus problems/congestion, Anemia, Sleep Weeks Of Treatment: 67 History: Apnea, Deep Vein Thrombosis, Hypertension, Peripheral Arterial Clustered Wound: Yes Disease, Type I Diabetes, Osteoarthritis, Osteomyelitis, Neuropathy, Seizure Disorder Photos Photo Uploaded By: [name] on 12/22/2019 11:10:36 Wound Measurements Length: (cm) 4.8 Width: (cm) 2.9 Depth: (cm) 0.1 Area: (cm) 10.933 Volume: (cm) 1.093 % Reduction in Area: 84.9% % Reduction in Volume: 95% Epithelialization: Small (1-33%) Tunneling: No Undermining: No Wound Description Classification: Grade 4 Wound Margin: Flat and Intact Exudate Amount: Medium Exudate Type: Serosanguineous Exudate Color: red, brown Foul Odor After Cleansing: No Slough/Fibrino Yes Wound Bed Granulation Amount: Medium (34-66%) Exposed Structure Granulation Quality: Pink Fascia Exposed: No Necrotic Amount: Medium (34-66%) Fat Layer (Subcutaneous Tissue) Exposed: Yes Necrotic Quality: Adherent Slough Tendon Exposed: No Muscle Exposed: No Joint Exposed: No Bone Exposed: No Treatment Notes Wound #43 (Right, Medial Foot) 1. Cleanse With Wound Cleanser 3. Primary Dressing Applied Calcium Alginate Other primary dressing (specifiy in notes) 4. Secondary Dressing Dry Gauze 6. Support Layer Psychologist, forensic Notes medihoney as primary. Electronic Signature(s) Signed: 12/21/2019 5:09:36 PM By:  [name] RN, BSN Signed: 12/21/2019 5:10:33 PM By: Deon Pilling Entered By: Deon Pilling on 12/21/2019 13:28:59 -------------------------------------------------------------------------------- Vitals Details Patient Name: Date of Service: [place] LLO Nancy Pao [name] 12/21/2019 12:45 PM Medical Record Number: [id number] Patient Account Number: [id number] Date of Birth/Sex: Treating RN: [birth year] (49 y.o. Nancy Fetter Primary Care Miyonna Ormiston: [name], [name] Other Clinician: Referring Deneen Slager: Treating Abed Schar/Extender: [name], [name] Weeks in Treatment: 78 Vital Signs Time Taken: 13:15 Temperature (F): 98.2 Height (in): 67 Pulse (bpm): 79 Weight (lbs): 125 Respiratory Rate (breaths/min): 18 Body Mass Index (BMI): 19.6 Blood Pressure (mmHg): 182/95 Reference Range: 80 - 120 mg / dl Electronic Signature(s) Signed: 12/22/2019 8:07:58 AM By: Sandre Kitty Entered By: Sandre Kitty on 12/21/2019 13:15:26

## 2019-12-22 NOTE — Progress Notes (Signed)
[name] [name] ([id number]) Visit Report for 10/23/2019 Arrival Information Details Patient Name: Date of Service: [name] [name] Peggye Fothergill 10/23/2019 1:15 PM Medical Record Number: [id number] Patient Account Number: [id number] Date of Birth/Sex: Treating RN: 26-Mar-1971 (48 y.o. F) [name] [name] Primary Care [name] [name]: [name] [name], [name] [name] Other Clinician: Referring [name] [name]: Treating [name] [name]/Extender: [name] [name], [name] [name] Weeks in Treatment: 76 Visit Information History Since Last Visit Added or deleted any medications: No Patient Arrived: Wheel Chair Any new allergies or adverse reactions: No Arrival Time: 13:52 Had a fall or experienced change in No Accompanied By: self activities of daily living that may affect Transfer Assistance: None risk of falls: Patient Identification Verified: Yes Signs or symptoms of abuse/neglect since last visito No Secondary Verification Process Completed: Yes Hospitalized since last visit: No Patient Requires Transmission-Based Precautions: No Implantable device outside of the clinic excluding No Patient Has Alerts: No cellular tissue based products placed in the center since last visit: Has Dressing in Place as Prescribed: Yes Pain Present Now: No Electronic Signature(s) Signed: 12/22/2019 8:09:04 AM By: [name] [name] Entered By: [name] [name] on 10/23/2019 13:52:23 -------------------------------------------------------------------------------- Clinic Level of Care Assessment Details Patient Name: Date of Service: [name] [name] Nancy L. 10/23/2019 1:15 PM Medical Record Number: [id number] Patient Account Number: [id number] Date of Birth/Sex: Treating RN: 06-20-1970 (49 y.o. F) [name] [name] Primary Care Armany Mano: [name] [name], [name] [name] Other Clinician: Referring [name] [name]: Treating [name] [name], [name] [name] Weeks in Treatment: 55 Clinic Level of Care Assessment Items TOOL 4 Quantity Score X-  1 0 Use when only an EandM is performed on FOLLOW-UP visit ASSESSMENTS - Nursing Assessment / Reassessment X- 1 10 Reassessment of Co-morbidities (includes updates in patient status) X- 1 5 Reassessment of Adherence to Treatment Plan ASSESSMENTS - Wound and Skin A ssessment / Reassessment X - Simple Wound Assessment / Reassessment - one wound 1 5 []  - 0 Complex Wound Assessment / Reassessment - multiple wounds []  - 0 Dermatologic / Skin Assessment (not related to wound area) ASSESSMENTS - Focused Assessment X- 1 5 Circumferential Edema Measurements - multi extremities []  - 0 Nutritional Assessment / Counseling / Intervention []  - 0 Lower Extremity Assessment (monofilament, tuning fork, pulses) []  - 0 Peripheral Arterial Disease Assessment (using hand held doppler) ASSESSMENTS - Ostomy and/or Continence Assessment and Care []  - 0 Incontinence Assessment and Management []  - 0 Ostomy Care Assessment and Management (repouching, etc.) PROCESS - Coordination of Care X - Simple Patient / Family Education for ongoing care 1 15 []  - 0 Complex (extensive) Patient / Family Education for ongoing care X- 1 10 Staff obtains Programmer, systems, Records, T Results / Process Orders est X- 1 10 Staff telephones HHA, Nursing Homes / Clarify orders / etc []  - 0 Routine Transfer to another Facility (non-emergent condition) []  - 0 Routine Hospital Admission (non-emergent condition) []  - 0 New Admissions / Biomedical engineer / Ordering NPWT Apligraf, etc. , []  - 0 Emergency Hospital Admission (emergent condition) X- 1 10 Simple Discharge Coordination []  - 0 Complex (extensive) Discharge Coordination PROCESS - Special Needs []  - 0 Pediatric / Minor Patient Management []  - 0 Isolation Patient Management []  - 0 Hearing / Language / Visual special needs []  - 0 Assessment of Community assistance (transportation, D/C planning, etc.) []  - 0 Additional assistance / Altered mentation []  -  0 Support Surface(s) Assessment (bed, cushion, seat, etc.) INTERVENTIONS - Wound Cleansing / Measurement X - Simple Wound Cleansing -  one wound 1 5 []  - 0 Complex Wound Cleansing - multiple wounds X- 1 5 Wound Imaging (photographs - any number of wounds) []  - 0 Wound Tracing (instead of photographs) X- 1 5 Simple Wound Measurement - one wound []  - 0 Complex Wound Measurement - multiple wounds INTERVENTIONS - Wound Dressings X - Small Wound Dressing one or multiple wounds 1 10 []  - 0 Medium Wound Dressing one or multiple wounds []  - 0 Large Wound Dressing one or multiple wounds X- 1 5 Application of Medications - topical []  - 0 Application of Medications - injection INTERVENTIONS - Miscellaneous []  - 0 External ear exam []  - 0 Specimen Collection (cultures, biopsies, blood, body fluids, etc.) []  - 0 Specimen(s) / Culture(s) sent or taken to Lab for analysis []  - 0 Patient Transfer (multiple staff / Civil Service fast streamer / Similar devices) []  - 0 Simple Staple / Suture removal (25 or less) []  - 0 Complex Staple / Suture removal (26 or more) []  - 0 Hypo / Hyperglycemic Management (close monitor of Blood Glucose) []  - 0 Ankle / Brachial Index (ABI) - do not check if billed separately X- 1 5 Vital Signs Has the patient been seen at the hospital within the last three years: Yes Total Score: 105 Level Of Care: New/Established - Level 3 Electronic Signature(s) Signed: 10/23/2019 5:51:32 PM By: [name] [name] Entered By: [name] [name] on 10/23/2019 14:16:32 -------------------------------------------------------------------------------- Lower Extremity Assessment Details Patient Name: Date of Service: [name] New Nancy L. 10/23/2019 1:15 PM Medical Record Number: [id number] Patient Account Number: [id number] Date of Birth/Sex: Treating RN: 21-Feb-1971 (49 y.o. Elam [name] Primary Care Macarena Langseth: [name] [name], [name] [name] Other Clinician: Referring [name] [name]: Treating  Besse Miron/Extender: [name] [name], [name] [name] Weeks in Treatment: 59 Edema Assessment Assessed: [Left: No] [Right: No] Edema: [Left: Ye] [Right: s] Calf Left: Right: Point of Measurement: cm From Medial Instep cm 27 cm Ankle Left: Right: Point of Measurement: cm From Medial Instep cm 18.2 cm Vascular Assessment Pulses: Dorsalis Pedis Palpable: [Right:Yes] Electronic Signature(s) Signed: 10/23/2019 5:49:13 PM By: Baruch Gouty RN, BSN Entered By: Baruch Gouty on 10/23/2019 13:59:55 -------------------------------------------------------------------------------- Multi Wound Chart Details Patient Name: Date of Service: [name] [name] Abbott Pao Nancy L. 10/23/2019 1:15 PM Medical Record Number: [id number] Patient Account Number: [id number] Date of Birth/Sex: Treating RN: 1970/10/04 (49 y.o. F) [name] [name] Primary Care Amere Iott: [name] [name], [name] [name] Other Clinician: Referring Avarae Zwart: Treating Kylie Simmonds/Extender: [name] [name], [name] [name] Weeks in Treatment: 12 Vital Signs Height(in): 60 Pulse(bpm): 49 Weight(lbs): 125 Blood Pressure(mmHg): 128/74 Body Mass Index(BMI): 20 Temperature(F): 98.4 Respiratory Rate(breaths/min): 18 Photos: [43:No Photos Right, Medial Foot] [N/A:N/A N/A] Wound Location: [43:Gradually Appeared] [N/A:N/A] Wounding Event: [43:Diabetic Wound/Ulcer of the Lower] [N/A:N/A] Primary Etiology: [43:Extremity Cataracts, Chronic sinus] [N/A:N/A] Comorbid History: [43:problems/congestion, Anemia, Sleep Apnea, Deep Vein Thrombosis, Hypertension, Peripheral Arterial Disease, Type I Diabetes, Osteoarthritis, Osteomyelitis, Neuropathy, Seizure Disorder 08/04/2018] [N/A:N/A] Date Acquired: [43:59] [N/A:N/A] Weeks of Treatment: [43:Open] [N/A:N/A] Wound Status: [43:Yes] [N/A:N/A] Clustered Wound: [43:4.2x3.1x0.1] [N/A:N/A] Measurements L x W x D (cm) [43:10.226] [N/A:N/A] A (cm) : rea [43:1.023] [N/A:N/A] Volume (cm) : [43:85.90%] [N/A:N/A] % Reduction  in A rea: [43:95.30%] [N/A:N/A] % Reduction in Volume: [43:Grade 4] [N/A:N/A] Classification: [43:Medium] [N/A:N/A] Exudate A mount: [43:Serosanguineous] [N/A:N/A] Exudate Type: [43:red, brown] [N/A:N/A] Exudate Color: [43:Flat and Intact] [N/A:N/A] Wound Margin: [43:Large (67-100%)] [N/A:N/A] Granulation A mount: [43:Red, Hyper-granulation] [N/A:N/A] Granulation Quality: [43:Small (1-33%)] [N/A:N/A] Necrotic A mount: [43:Fat Layer (Subcutaneous Tissue): Yes N/A] Exposed Structures: [43:Fascia: No  Tendon: No Muscle: No Joint: No Bone: No Small (1-33%)] [N/A:N/A] Treatment Notes Electronic Signature(s) Signed: 10/23/2019 5:20:32 PM By: Linton Ham MD Signed: 10/23/2019 5:51:32 PM By: [name] [name] Entered By: Linton Ham on 10/23/2019 14:21:48 -------------------------------------------------------------------------------- Multi-Disciplinary Care Plan Details Patient Name: Date of Service: [name] [address] [place] Nancy L. 10/23/2019 1:15 PM Medical Record Number: [id number] Patient Account Number: [id number] Date of Birth/Sex: Treating RN: [birth year] (49 y.o. [name] Primary Care Rhett Najera: [name] [name], [name] [name] Other Clinician: Referring Taryn Nave: Treating Braxtyn Bojarski/Extender: [name] [name], [name] [name] Weeks in Treatment: 6 Active Inactive Wound/Skin Impairment Nursing Diagnoses: Impaired tissue integrity Knowledge deficit related to ulceration/compromised skin integrity Goals: Patient/caregiver will verbalize understanding of skin care regimen Date Initiated: 09/03/2018 Target Resolution Date: 10/29/2019 Goal Status: Active Ulcer/skin breakdown will have a volume reduction of 30% by week 4 Date Initiated: 09/03/2018 Date Inactivated: 10/07/2018 Target Resolution Date: 10/01/2018 Goal Status: Unmet Unmet Reason: COMORBITIES Ulcer/skin breakdown will have a volume reduction of 50% by week 8 Date Initiated: 10/07/2018 Date Inactivated: 11/03/2018 Target Resolution  Date: 10/31/2018 Goal Status: Unmet Unmet Reason: Osteomyelitis Interventions: Assess patient/caregiver ability to obtain necessary supplies Assess patient/caregiver ability to perform ulcer/skin care regimen upon admission and as needed Assess ulceration(s) every visit Provide education on ulcer and skin care Treatment Activities: Skin care regimen initiated : 09/03/2018 Topical wound management initiated : 09/03/2018 Notes: Electronic Signature(s) Signed: 10/23/2019 5:51:32 PM By: [name] [name] Entered By: [name] [name] on 10/23/2019 13:43:49 -------------------------------------------------------------------------------- Pain Assessment Details Patient Name: Date of Service: [name] New Nancy L. 10/23/2019 1:15 PM Medical Record Number: [id number] Patient Account Number: [id number] Date of Birth/Sex: Treating RN: 1970/12/07 (49 y.o. [name] Primary Care Justino Boze: [name] [name], [name] [name] Other Clinician: Referring Donta Fuster: Treating Idamay Hosein/Extender: [name] [name], [name] [name] Weeks in Treatment: 62 Active Problems Location of Pain Severity and Description of Pain Patient Has Paino No Site Locations Pain Management and Medication Current Pain Management: Electronic Signature(s) Signed: 10/23/2019 5:51:32 PM By: [name] [name] Signed: 12/22/2019 8:09:04 AM By: [name] [name] Entered By: [name] [name] on 10/23/2019 13:52:48 -------------------------------------------------------------------------------- Patient/Caregiver Education Details Patient Name: Date of Service: [name] [name], [name] [name] 7/23/2021andnbsp1:15 PM Medical Record Number: [id number] Patient Account Number: [id number] Date of Birth/Gender: Treating RN: [birth year] (49 y.o. [name] Primary Care Physician: [name] [name], [name] [name] Other Clinician: Referring Physician: Treating Physician/Extender: [name] [name], [name] [name] Weeks in Treatment: 51 Education  Assessment Education Provided To: Patient Education Topics Provided Wound/Skin Impairment: Handouts: Caring for Your Ulcer Methods: Explain/Verbal Responses: State content correctly Electronic Signature(s) Signed: 10/23/2019 5:51:32 PM By: [name] [name] Entered By: [name] [name] on 10/23/2019 13:44:14 -------------------------------------------------------------------------------- Wound Assessment Details Patient Name: Date of Service: [name] New Nancy L. 10/23/2019 1:15 PM Medical Record Number: [id number] Patient Account Number: [id number] Date of Birth/Sex: Treating RN: June 03, 1970 (49 y.o. [name] Primary Care Dione Petron: [name] [name], [name] [name] Other Clinician: Referring Azel Gumina: Treating Trudi Morgenthaler/Extender: [name] [name], [name] [name] Weeks in Treatment: 59 Wound Status Wound Number: 43 Primary Diabetic Wound/Ulcer of the Lower Extremity Etiology: Wound Location: Right, Medial Foot Wound Open Wounding Event: Gradually Appeared Status: Date Acquired: 08/04/2018 Comorbid Cataracts, Chronic sinus problems/congestion, Anemia, Sleep Weeks Of Treatment: 59 History: Apnea, Deep Vein Thrombosis, Hypertension, Peripheral Arterial Clustered Wound: Yes Disease, Type I Diabetes, Osteoarthritis, Osteomyelitis, Neuropathy, Seizure Disorder Photos Photo Uploaded By: [name] on 10/26/2019 13:30:26 Wound Measurements Length: (cm) 4.2 Width: (cm) 3.1 Depth: (cm) 0.1  Area: (cm) 10.226 Volume: (cm) 1.023 % Reduction in Area: 85.9% % Reduction in Volume: 95.3% Epithelialization: Small (1-33%) Tunneling: No Undermining: No Wound Description Classification: Grade 4 Wound Margin: Flat and Intact Exudate Amount: Medium Exudate Type: Serosanguineous Exudate Color: red, brown Foul Odor After Cleansing: No Slough/Fibrino Yes Wound Bed Granulation Amount: Large (67-100%) Exposed Structure Granulation Quality: Red, Hyper-granulation Fascia Exposed:  No Necrotic Amount: Small (1-33%) Fat Layer (Subcutaneous Tissue) Exposed: Yes Necrotic Quality: Adherent Slough Tendon Exposed: No Muscle Exposed: No Joint Exposed: No Bone Exposed: No Electronic Signature(s) Signed: 10/23/2019 5:49:13 PM By: Baruch Gouty RN, BSN Signed: 10/23/2019 5:51:32 PM By: [name] [name] Entered By: Baruch Gouty on 10/23/2019 14:01:20 -------------------------------------------------------------------------------- Vitals Details Patient Name: Date of Service: [name] [name] Abbott Pao Nancy L. 10/23/2019 1:15 PM Medical Record Number: [id number] Patient Account Number: [id number] Date of Birth/Sex: Treating RN: [birth year] (49 y.o. F) [name] [name] Primary Care Laurana Magistro: [name] [name], [name] [name] Other Clinician: Referring Oluwadamilola Deliz: Treating Derian Dimalanta/Extender: [name] [name], [name] [name] Weeks in Treatment: 10 Vital Signs Time Taken: 13:52 Temperature (F): 98.4 Height (in): 67 Pulse (bpm): 84 Weight (lbs): 125 Respiratory Rate (breaths/min): 18 Body Mass Index (BMI): 19.6 Blood Pressure (mmHg): 128/74 Reference Range: 80 - 120 mg / dl Electronic Signature(s) Signed: 12/22/2019 8:09:04 AM By: [name] [name] Entered By: [name] [name] on 10/23/2019 13:52:41

## 2019-12-24 IMAGING — DX PORTABLE CHEST - 1 VIEW
1 series · 1 of 1 positions shown · non-contrast
Comparison: Portable exam 8286 hours compared to 05/04/2017

CLINICAL DATA: Fever for 5 days, nonproductive cough, diarrhea,
nausea

EXAM:
PORTABLE CHEST 1 VIEW

[chest ap]
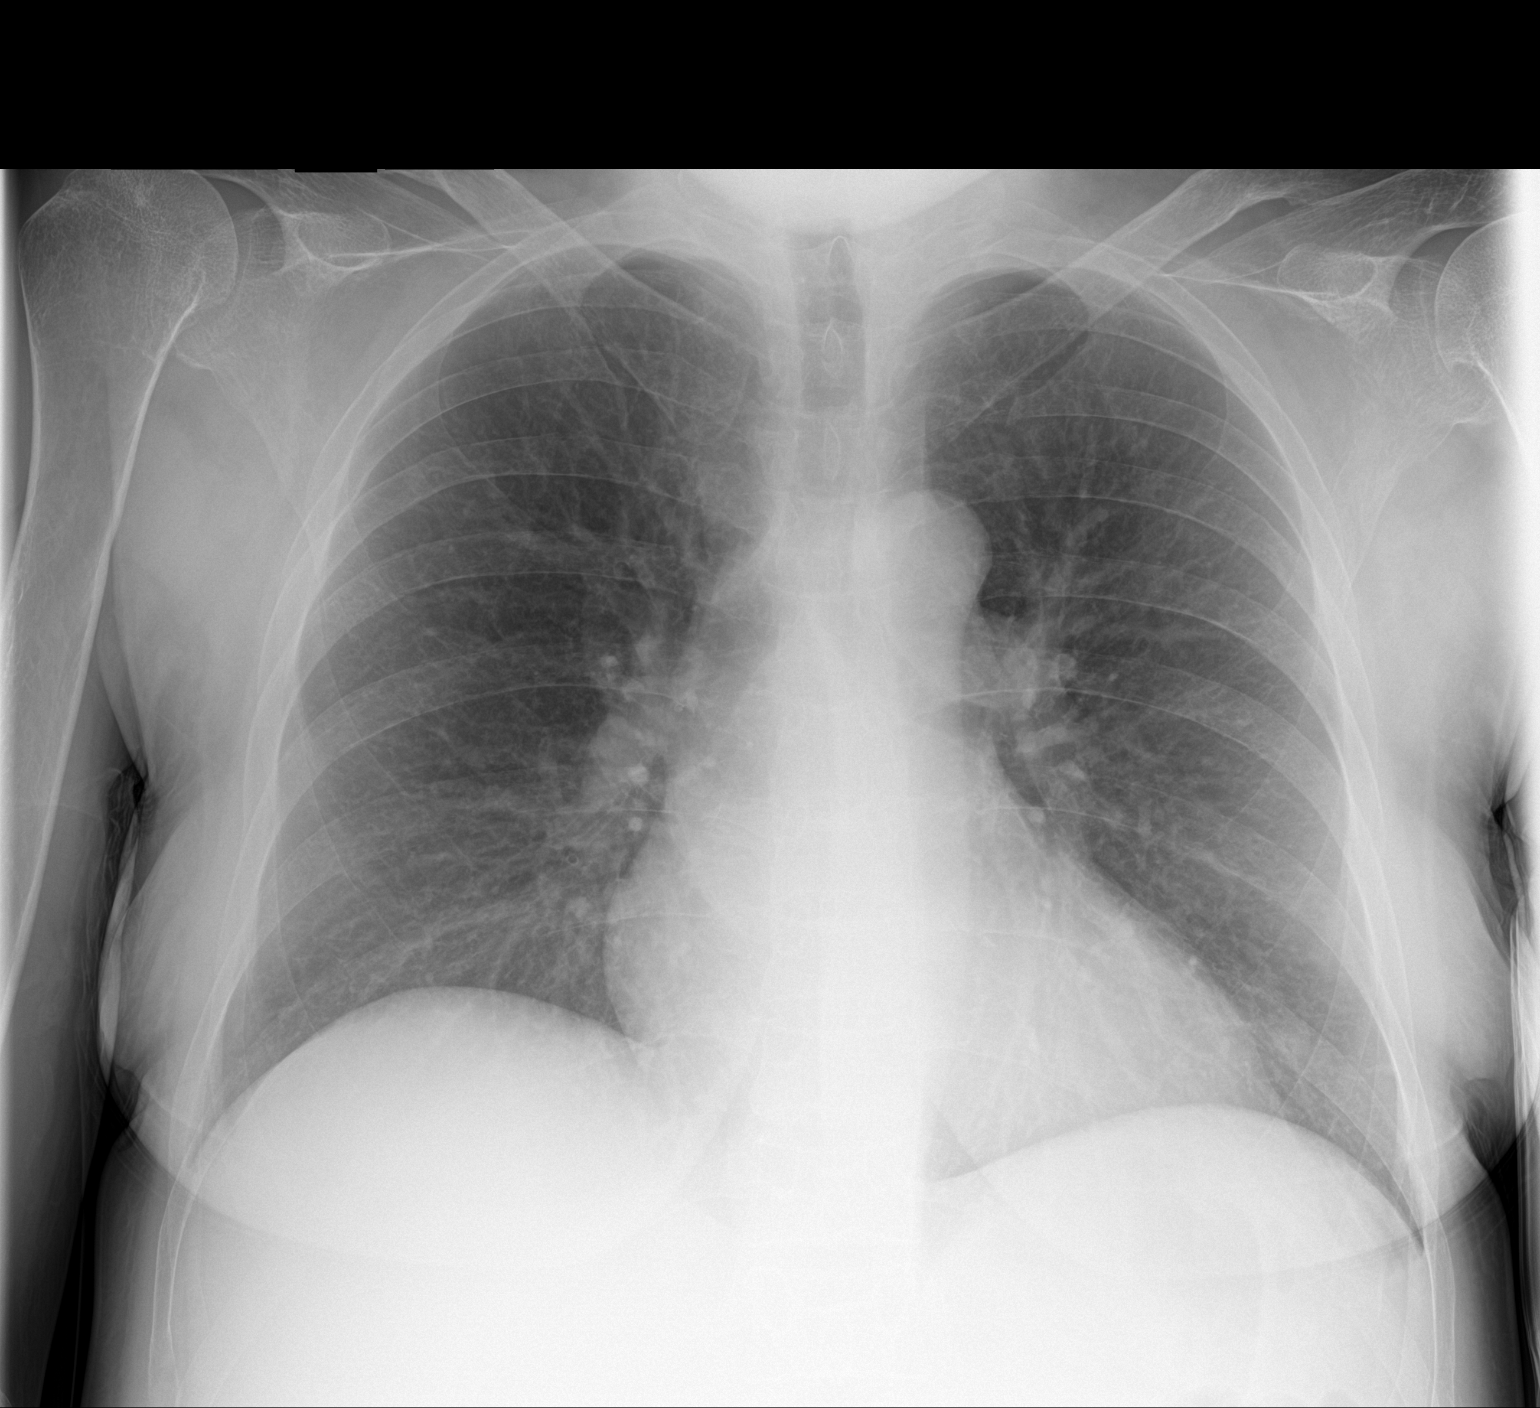

[1 of 1 positions shown; findings below may reference images not displayed]

FINDINGS: Normal heart size, mediastinal contours, and pulmonary vascularity.

Peribronchial thickening, new.

No acute infiltrate, pleural effusion, or pneumothorax.

Bones appear demineralized.
IMPRESSION: Bronchitic changes without infiltrate.

## 2019-12-24 IMAGING — MR MRI OF THE RIGHT ANKLE WITHOUT CONTRAST
4 of 6 series · 18 of 40 positions shown · non-contrast
Comparison: MRI dated 05/05/2018 and radiographs dated 08/06/2017

CLINICAL DATA: Pain and swelling of the right ankle. Previous
amputation of the foot at the level of the bases of the metatarsals.

EXAM:
MRI OF THE RIGHT ANKLE WITHOUT CONTRAST
TECHNIQUE: Multiplanar, multisequence MR imaging of the ankle was performed. No
intravenous contrast was administered.

[Series 3: T1 · sagittal · 4.0mm · 0.33mm/px · 3 of 18 slices shown]
[im 1/18]
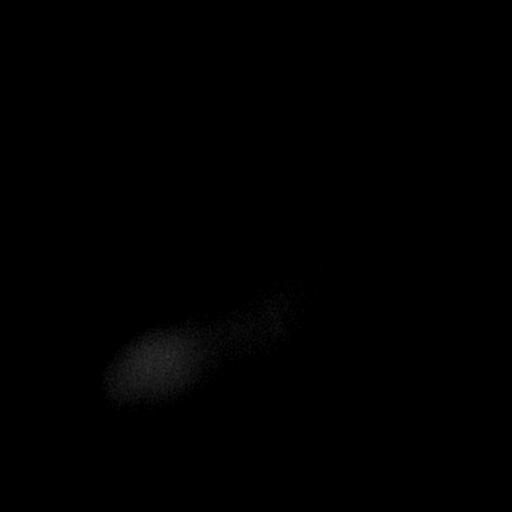
[im 9/18]
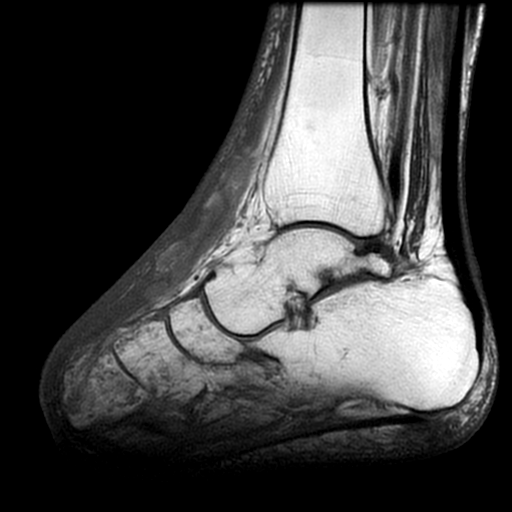
[im 18/18]
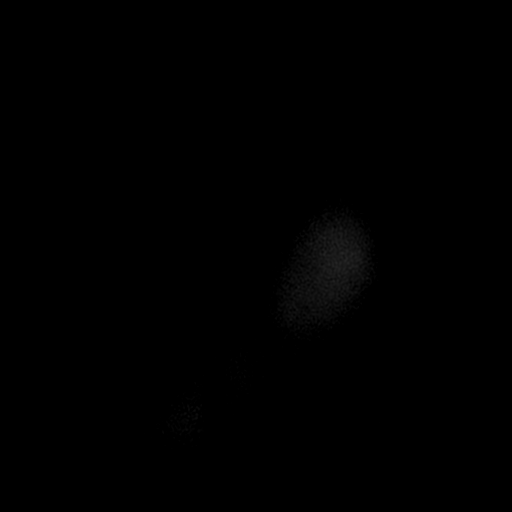

[Series 5: T2 fat-sat · axial · 3.0mm · 0.33mm/px · z∈[-71,+27]mm · 5 of 32 slices shown (1 of 2)]
[im 1/32]
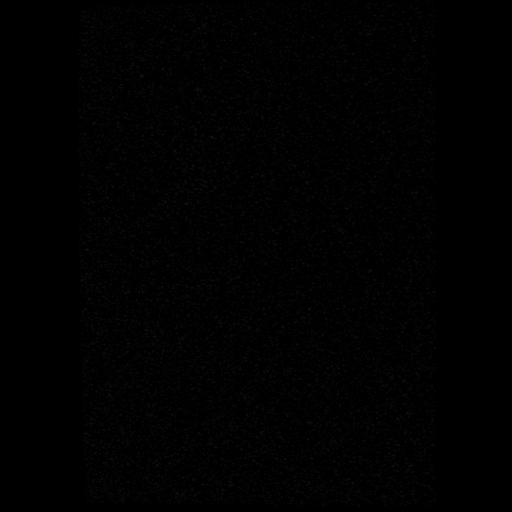
[im 6/32]
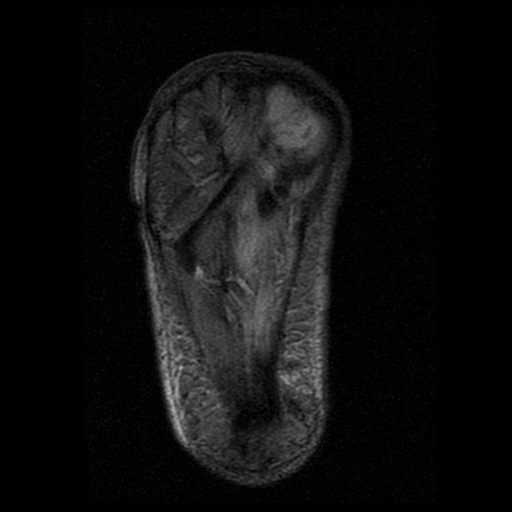
[im 11/32]
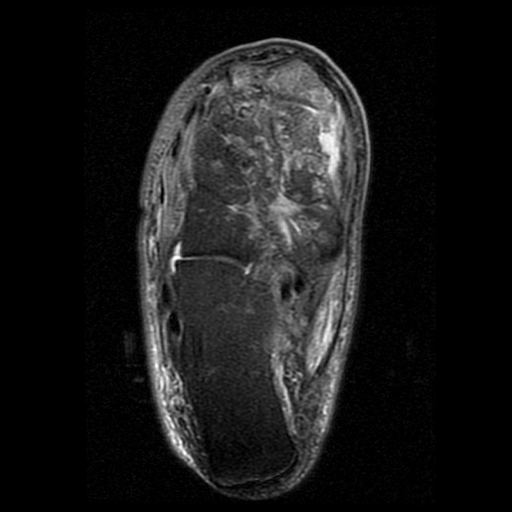
[im 16/32]
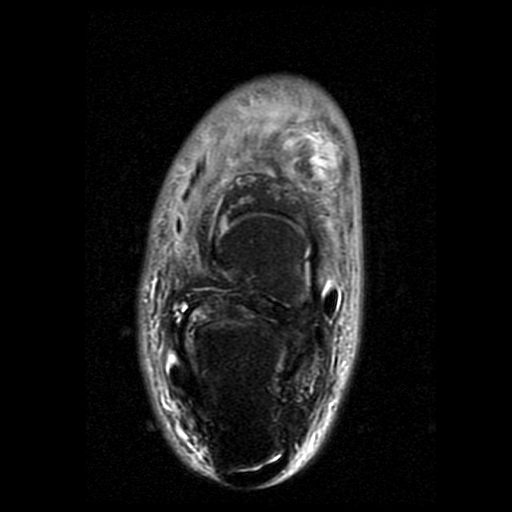
[im 26/32]
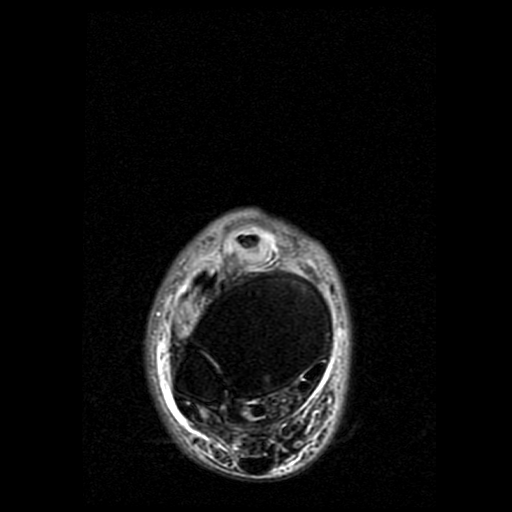

[Series 6: T2 fat-sat · coronal · 3.0mm · 0.31mm/px · 3 of 40 slices shown (2 of 2)]
[im 5/40]
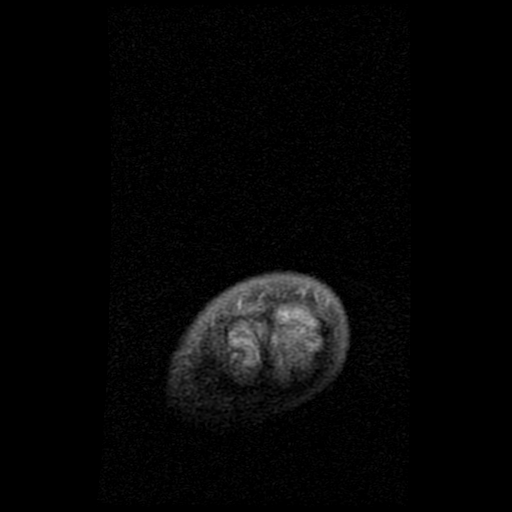
[im 20/40]
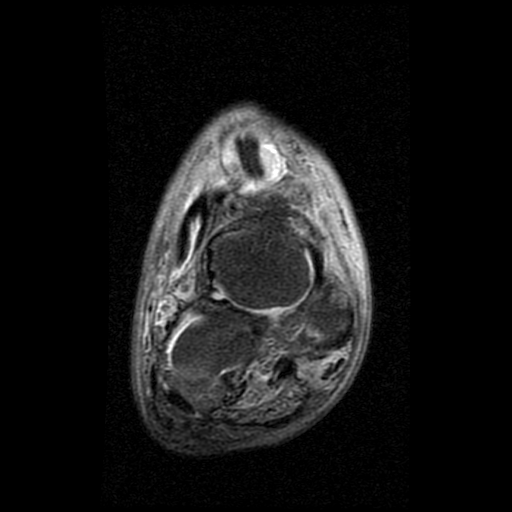
[im 35/40]
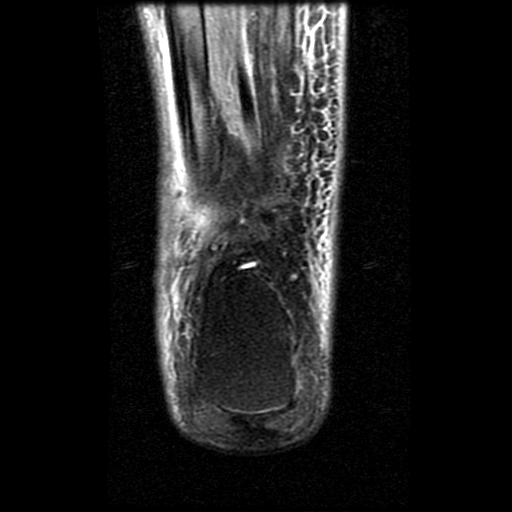

[Series 7: PD fat-sat · axial · 3.0mm · 0.33mm/px · z∈[-71,+50]mm · 7 of 32 slices shown]
[im 1/32]
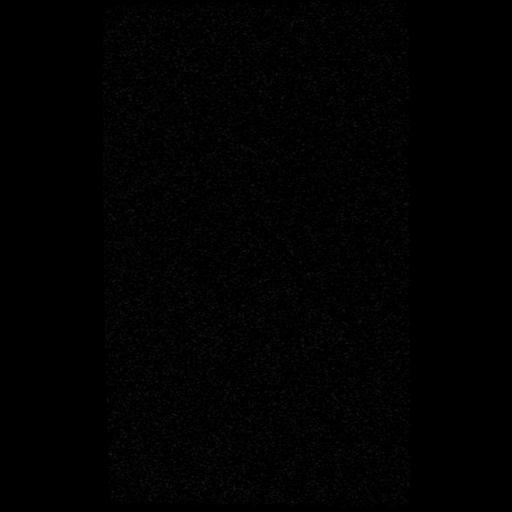
[im 6/32]
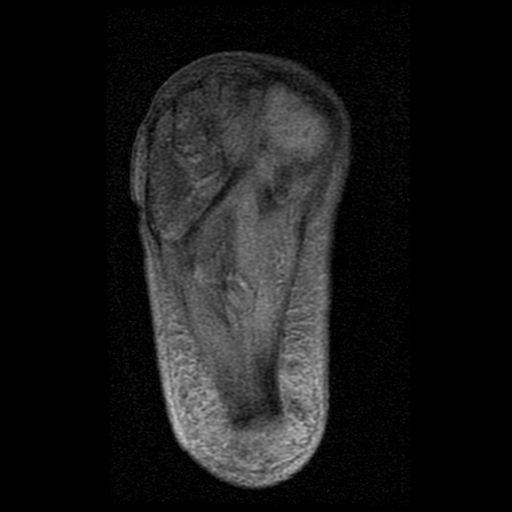
[im 11/32]
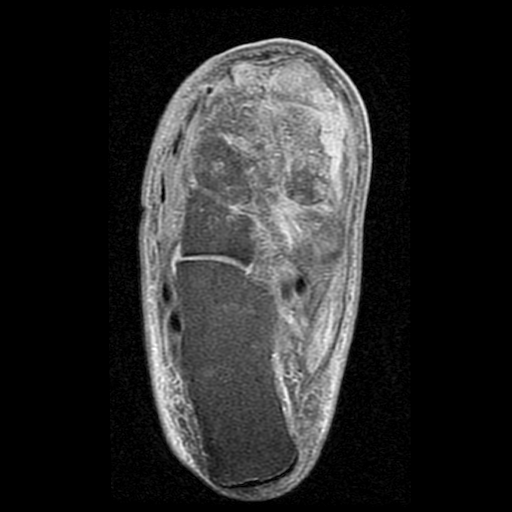
[im 16/32]
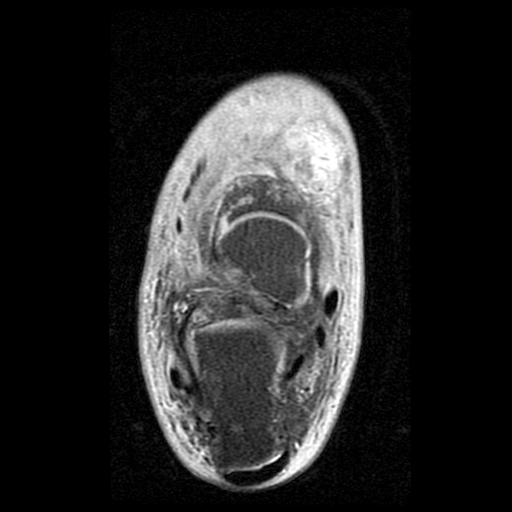
[im 21/32]
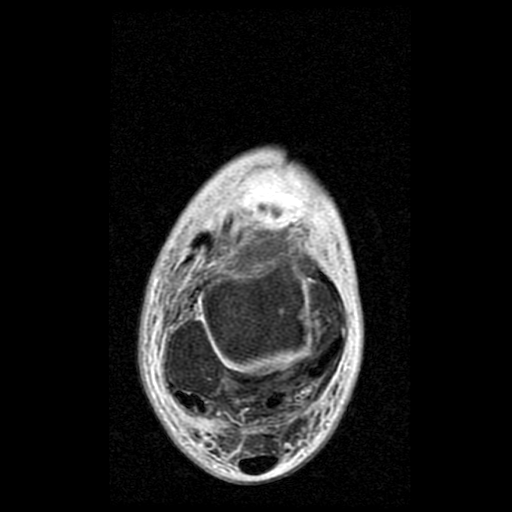
[im 26/32]
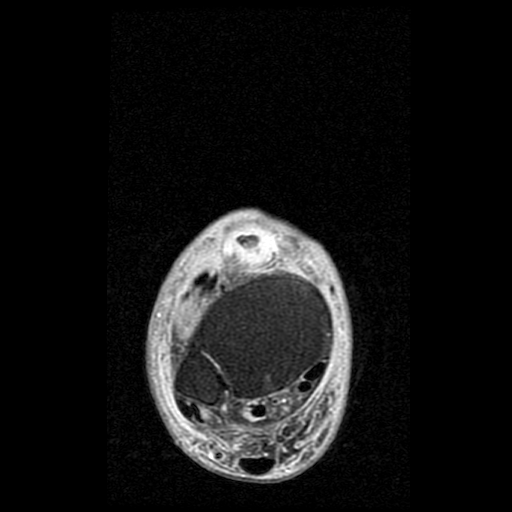
[im 32/32]
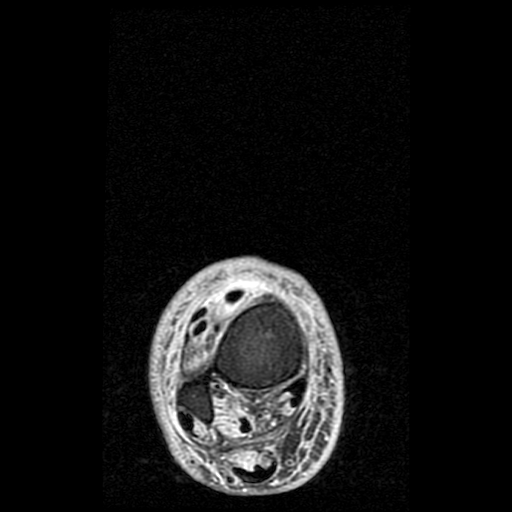

[18 of 40 positions shown; findings below may reference images not displayed]

FINDINGS: TENDONS

Peroneal: There is a tiny amount of fluid in the peroneus longus
tendon sheath, unchanged since the prior study.

Posteromedial: There is increased fluid in the sheath of the
posterior tibialis tendon just distal to the tip of the medial
malleolus but this is minimal.

Anterior: There is tenosynovitis involving the stumps of the
tibialis anterior tendon and extensor hallucis longus tendon. The
fluid, likely pus, extends from the stumps of the tendons into the
first tarsal metatarsal joint and around the base of the first
metatarsal.

Achilles: Normal.

Plantar Fascia: Normal.

LIGAMENTS

Lateral: Intact.

Medial: Intact.

CARTILAGE

Ankle Joint: No significant abnormality.

Subtalar Joints/Sinus Tarsi: Progressive arthritis of the posterior
facets of the subtalar joint. Ligaments of the sinus tarsi are
intact.

Bones: The patient has developed patchy mottled signal abnormalities
in bases of the first and second metatarsal stumps and to a lesser
degree in the bases of the third, fourth and fifth metatarsal
stumps. There is also new patchy signal abnormality in the medial
and middle cuneiforms as well as in the navicular and to a lesser
degree in the lateral cuneiform and cuboid.

Other: Diffuse subcutaneous edema around the ankle and in the foot.
IMPRESSION: 1. Findings consistent with infectious tenosynovitis of the anterior
tibialis tendon and extensor hallucis longus tendon with pus
extending into the first tarsal metatarsal joint. Probable
osteomyelitis involving the medial cuneiform and bases of the first
and second metatarsals. I suspect there is less severe osteomyelitis
involving the other metatarsal bases, the middle and lateral
cuneiforms, and the navicular and possibly the cuboid.

## 2019-12-25 IMAGING — DX RIGHT FOOT COMPLETE - 3+ VIEW
2 series · 3 of 3 positions shown · non-contrast
Comparison: None.

CLINICAL DATA: Osteomyelitis.  Right foot infection.

EXAM:
RIGHT FOOT COMPLETE - 3+ VIEW

[Series 1: foot · 0.14mm/px · 2 of 2 slices shown]
[im 1/2]
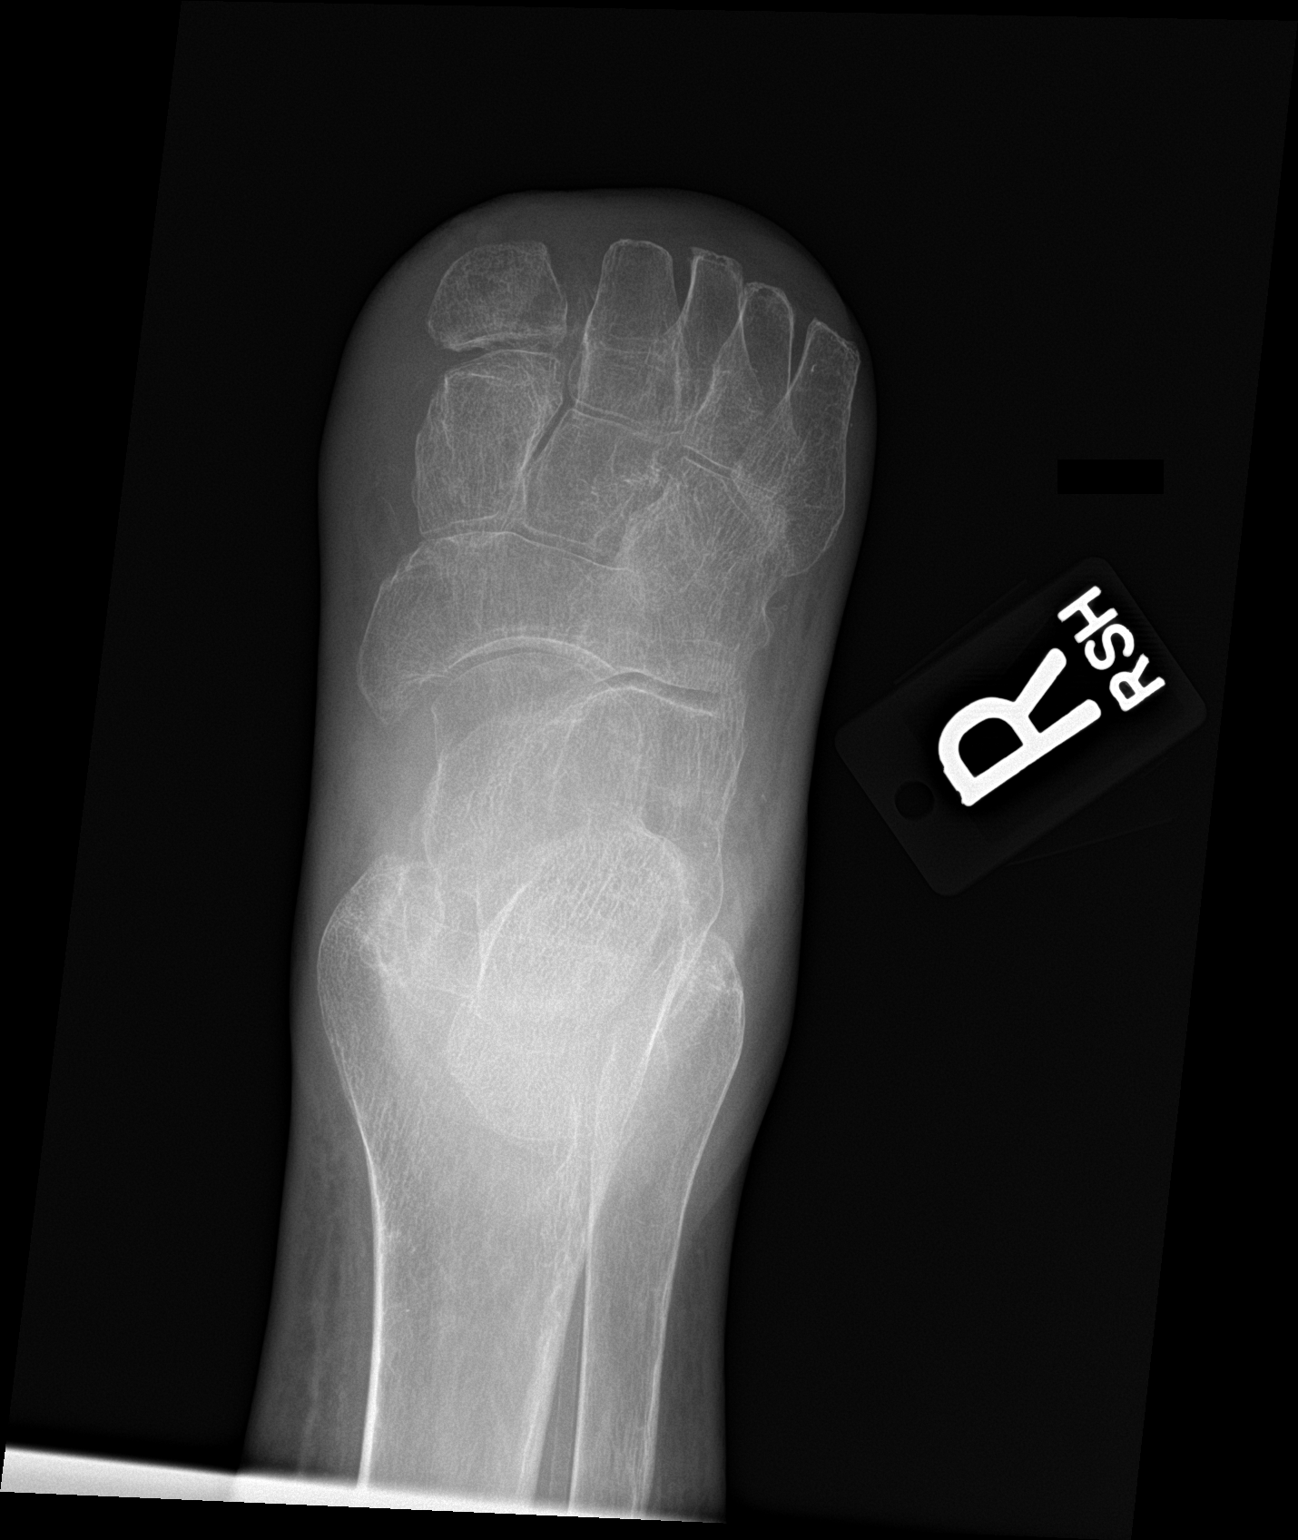
[im 2/2]
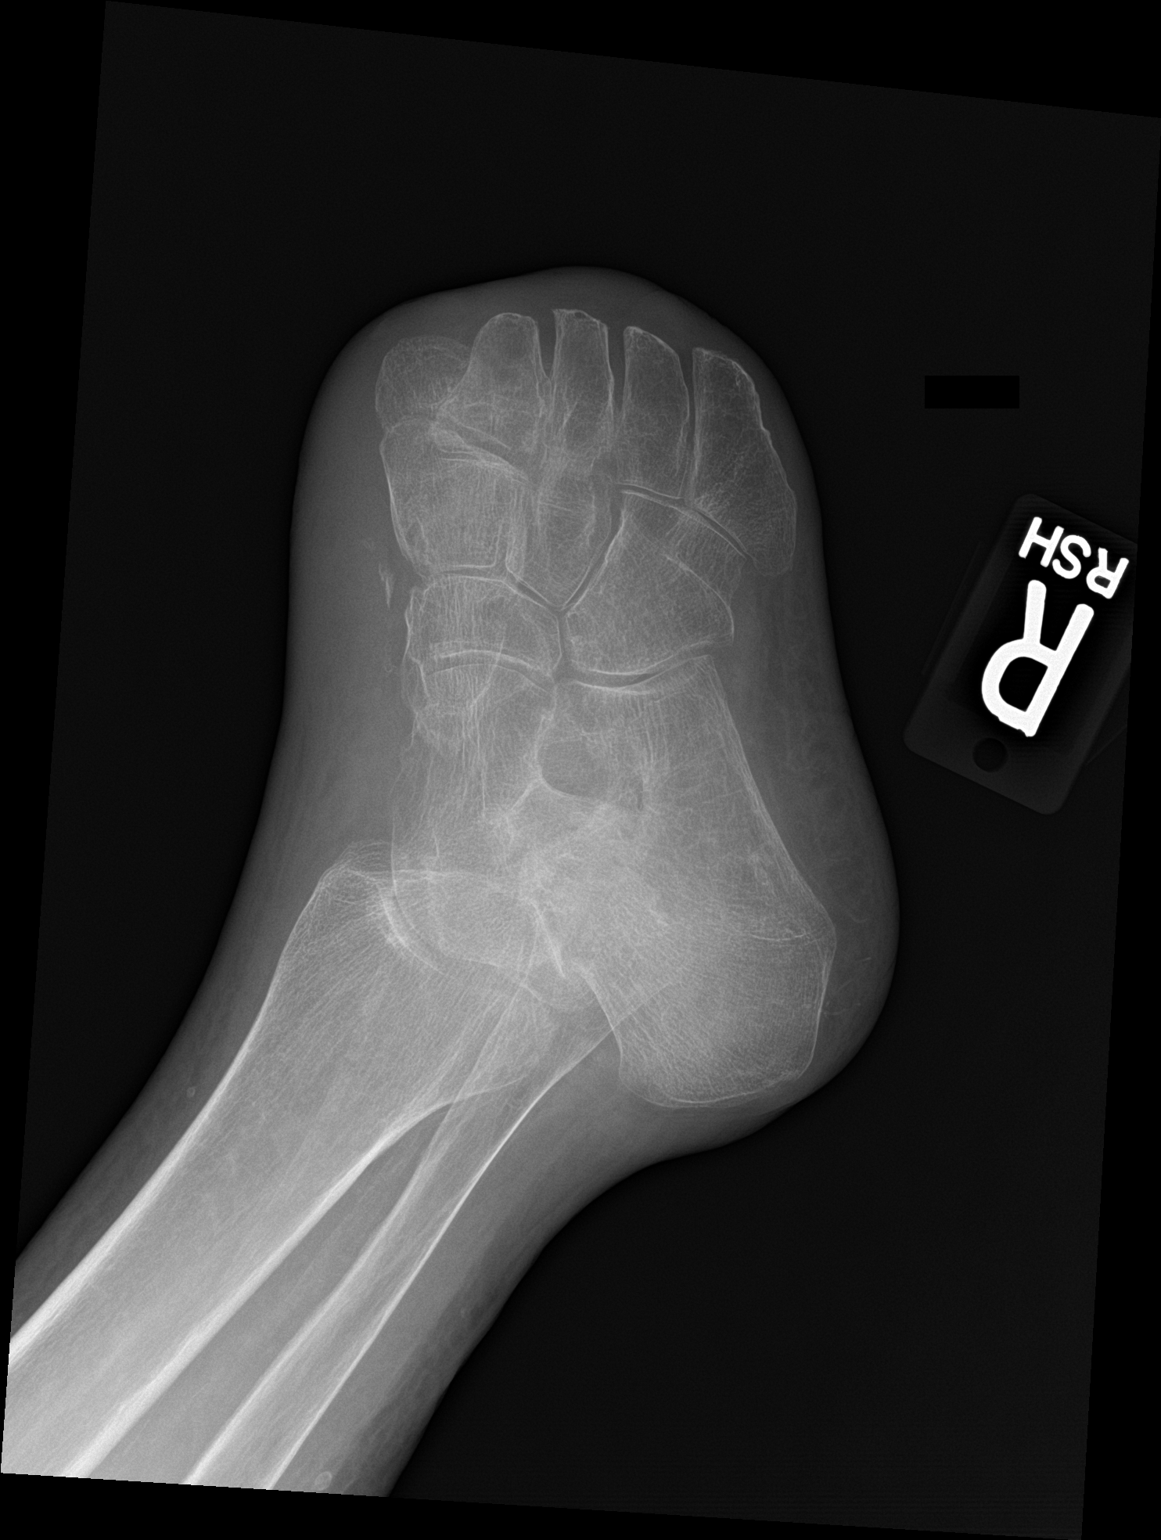

[leg]
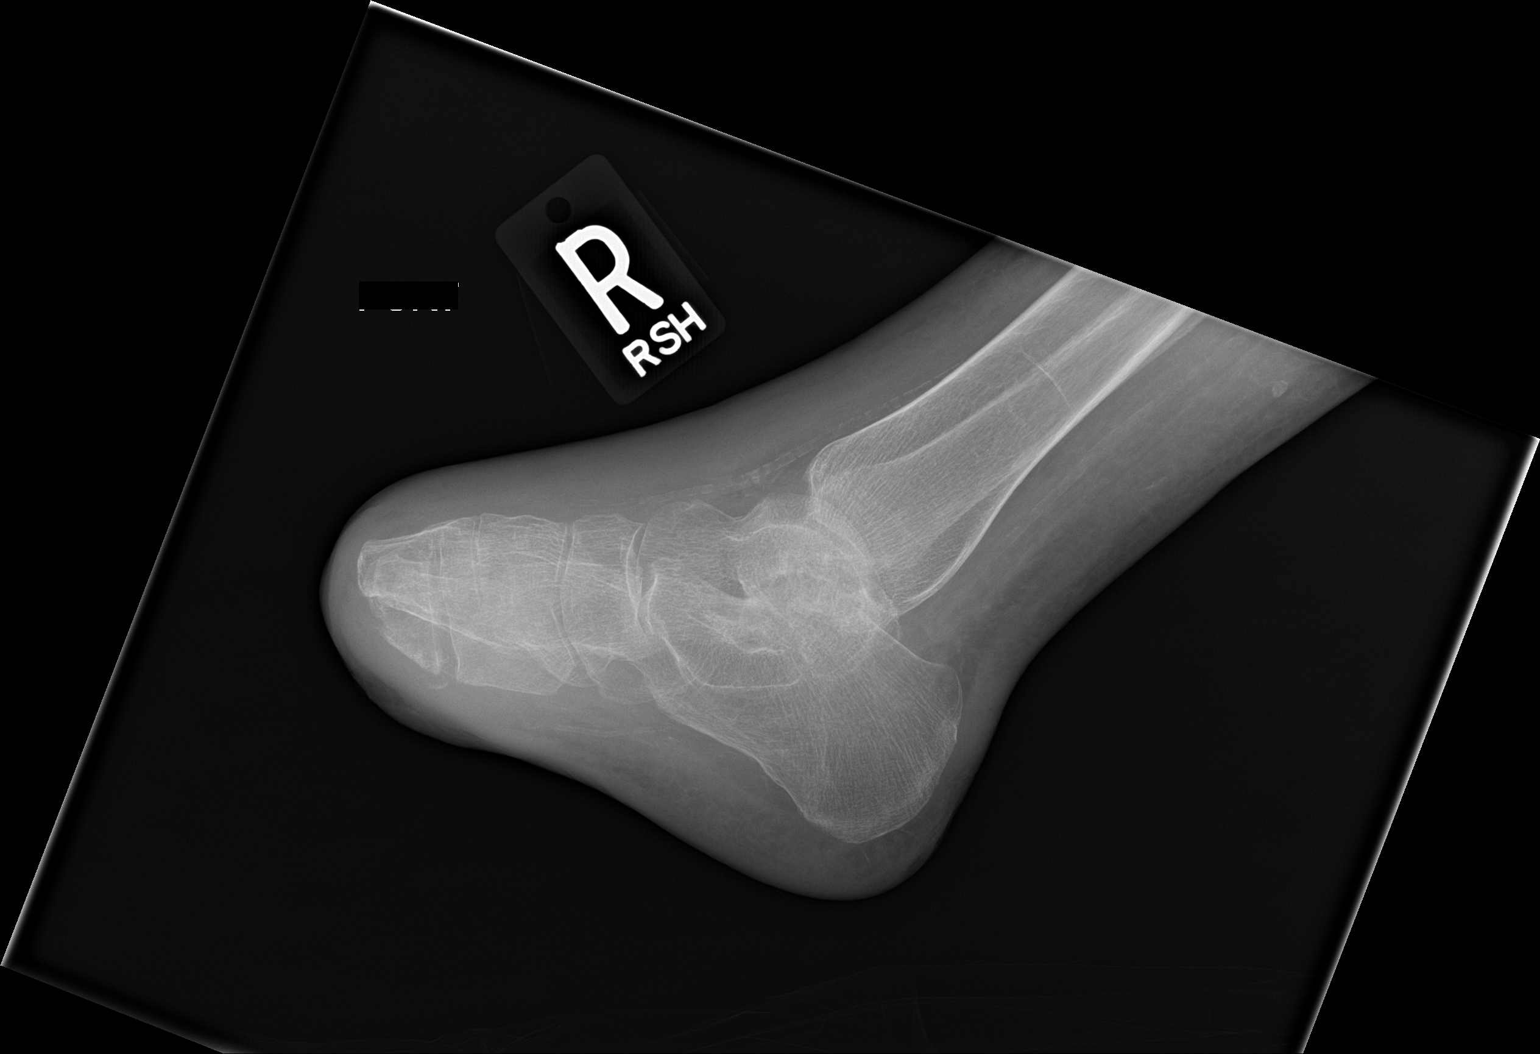

[3 of 3 positions shown; findings below may reference images not displayed]

FINDINGS: The patient has had forefoot amputation at the level of the bases of
the metatarsals. There is a small area of the erosion of the cortex
of stump of the fifth metatarsal seen only on the lateral view.
There is soft tissue swelling of the stump and on the dorsum of the
foot and anterior aspect of the ankle. No fractures.
IMPRESSION: Possible focal osteomyelitis of the stump of the fifth metatarsal.
Soft tissue swelling.

## 2019-12-25 IMAGING — US US RENAL
1 series · 14 of 25 positions shown · non-contrast
Comparison: Prior CT from 02/01/2014

CLINICAL DATA: Initial evaluation for acute renal injury. History
of diabetes.

EXAM:
RENAL / URINARY TRACT ULTRASOUND COMPLETE

[Series 1: us renal · 14 of 29 slices shown]
[im 1/29]
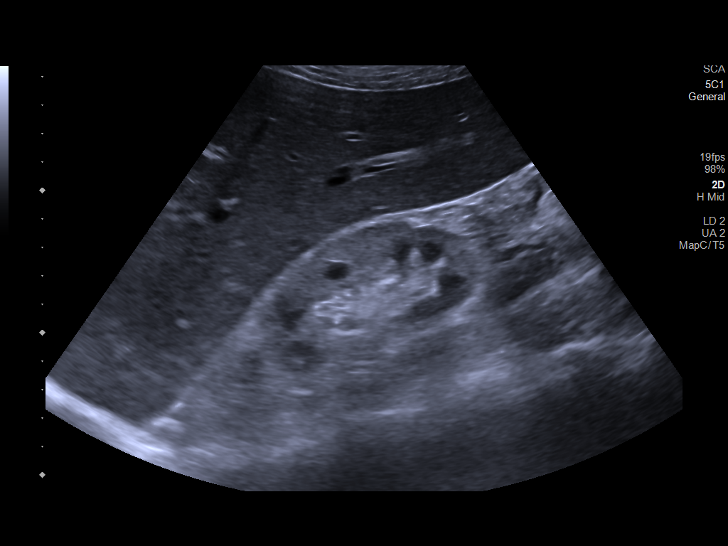
[im 3/29]
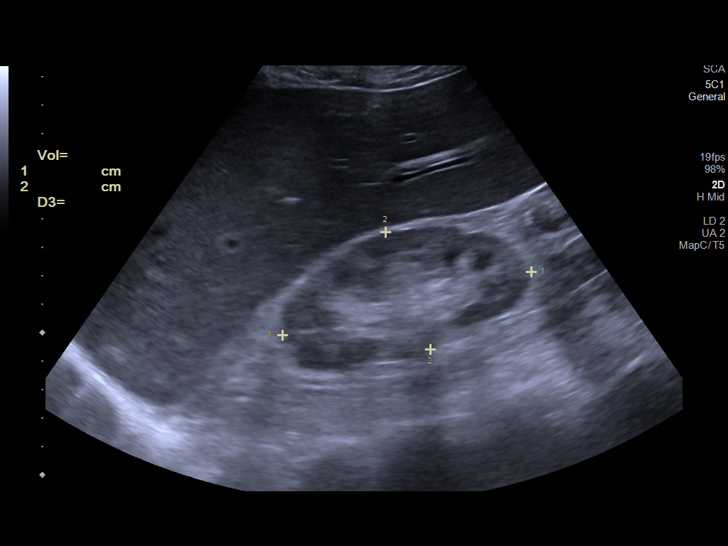
[im 5/29]
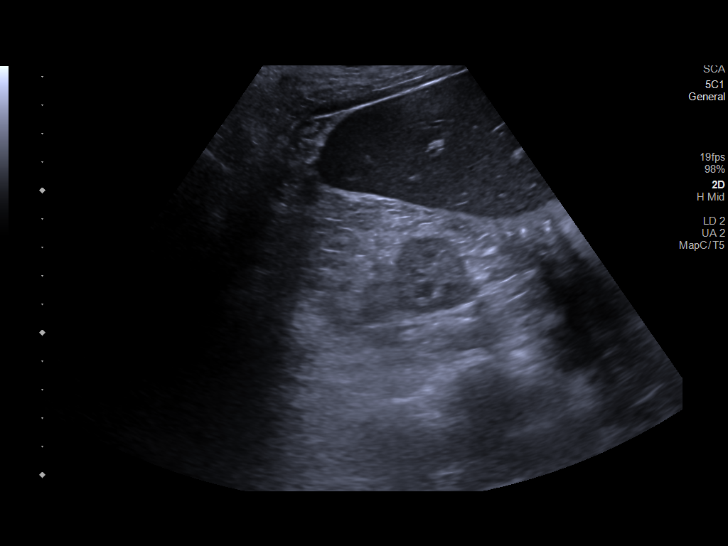
[im 8/29]
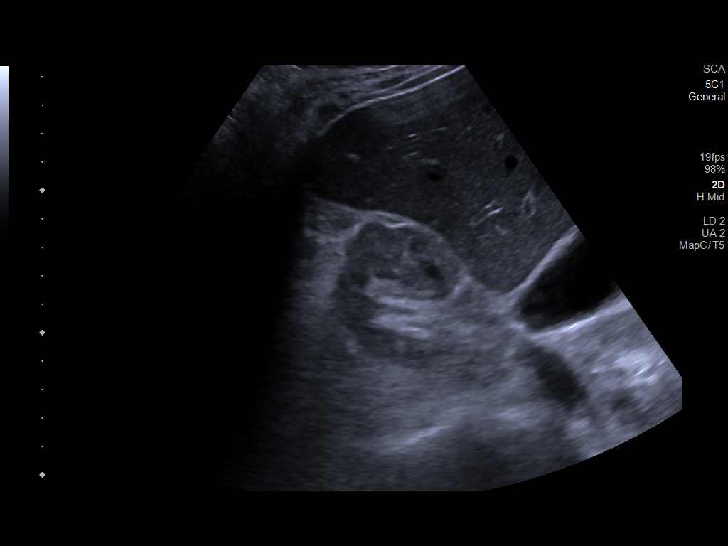
[im 10/29]
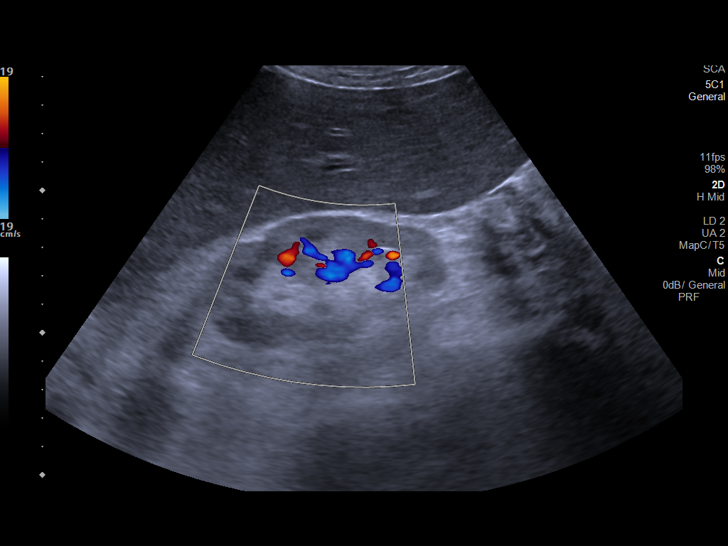
[im 11/29]
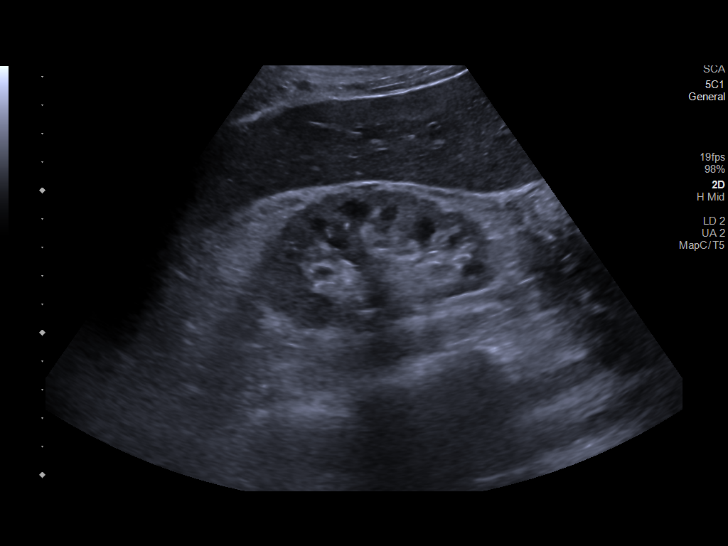
[im 13/29]
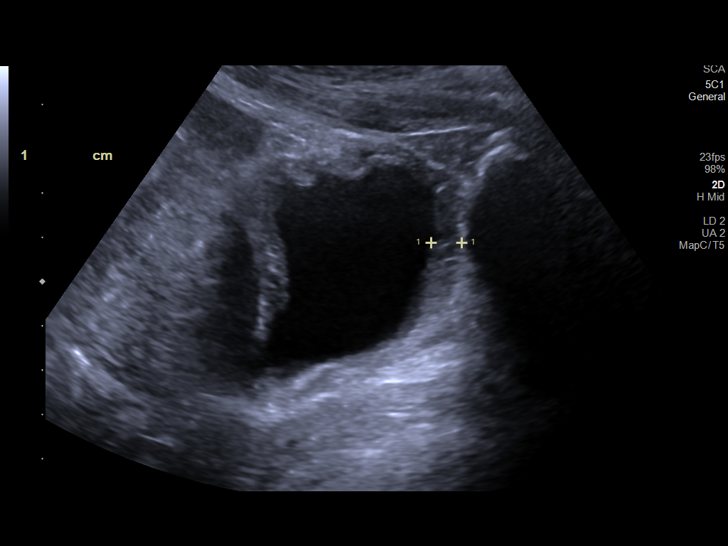
[im 16/29]
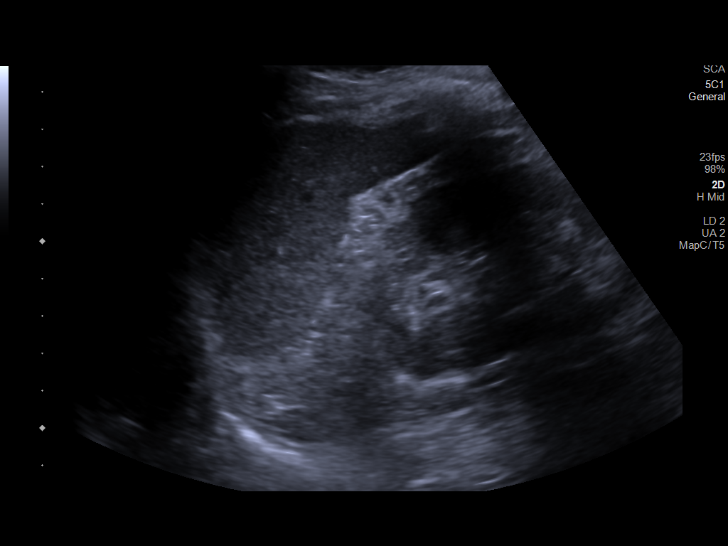
[im 18/29]
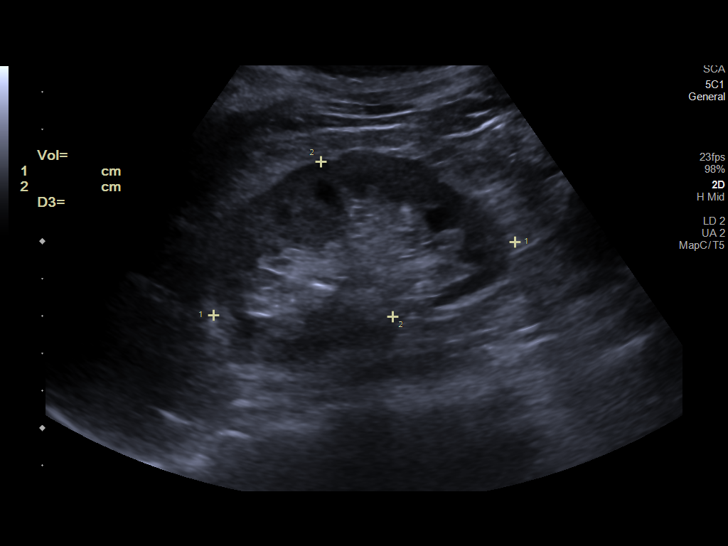
[im 19/29]
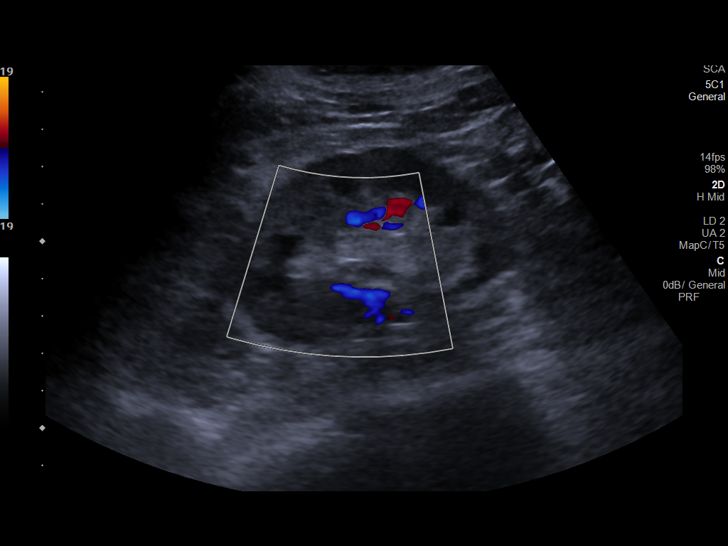
[im 22/29]
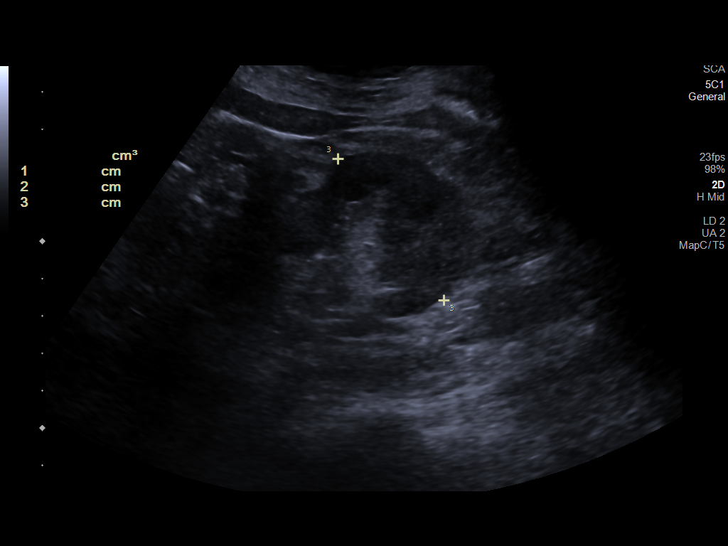
[im 24/29]
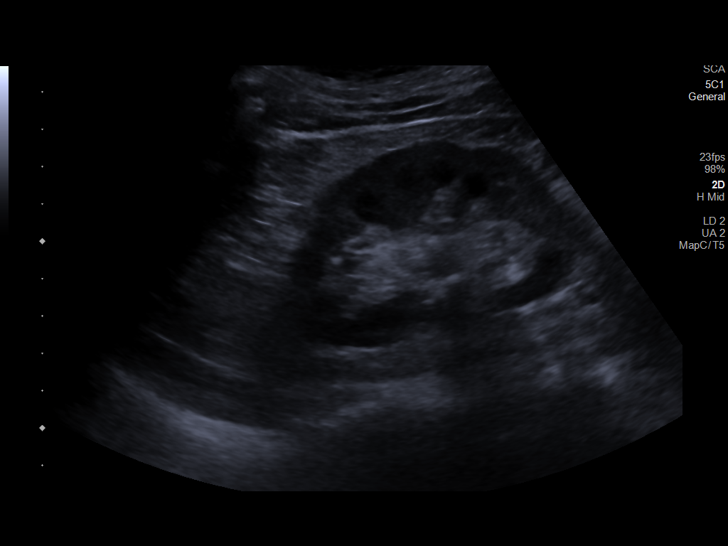
[im 26/29]
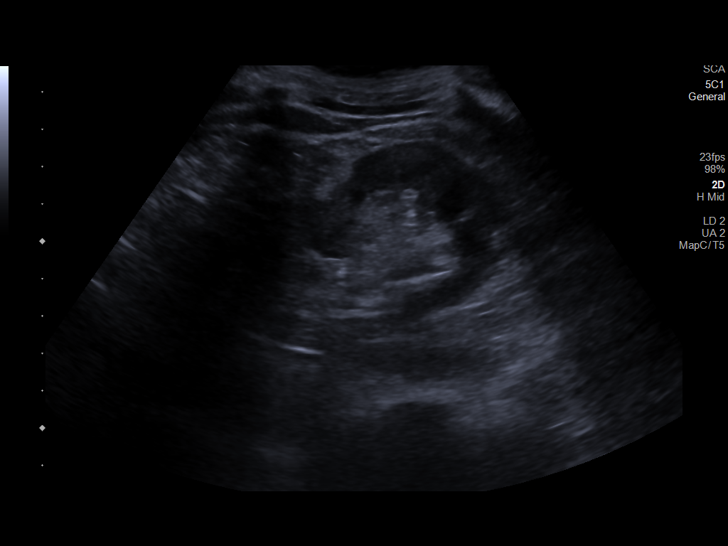
[im 29/29]
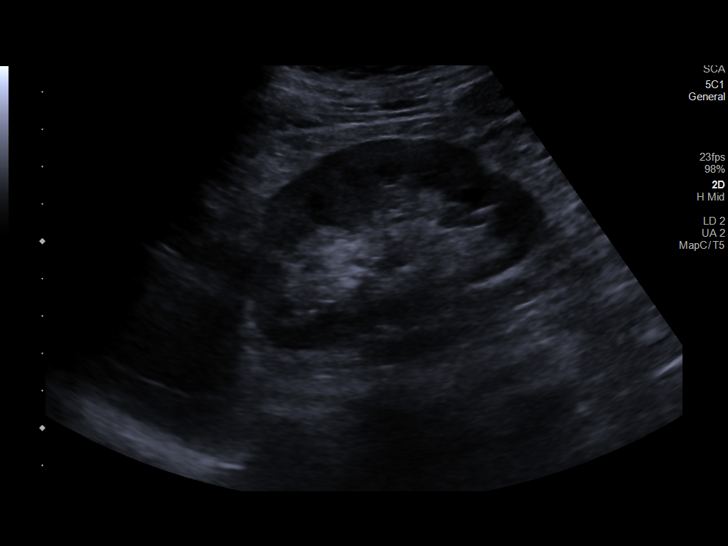

[14 of 25 positions shown; findings below may reference images not displayed]

FINDINGS: Right Kidney:

Renal measurements: 9.0 x 4.4 x 4.6 cm = volume: 94.9 mL. Increased
echogenicity within the renal parenchyma. No mass or hydronephrosis
visualized.

Left Kidney:

Renal measurements: 8.3 x 4.5 x 4.7 cm = volume: 93.9 mL. Increased
echogenicity within the renal parenchyma. No mass or hydronephrosis
visualized.

Bladder:

Mild circumferential bladder wall thickening, likely related
incomplete distension.
IMPRESSION: 1. Increased echogenicity within the renal parenchyma, compatible
with medical renal disease.
2. No hydronephrosis.

## 2019-12-27 IMAGING — US IR RIGHT FLUORO GUIDE CV LINE
1 series · 3 of 3 positions shown · non-contrast
Comparison: none

INDICATION: Chronic kidney disease and needing dialysis.

[Series 1: ir right fluoro guide cv line · 3 of 3 slices shown]
[im 1/3]
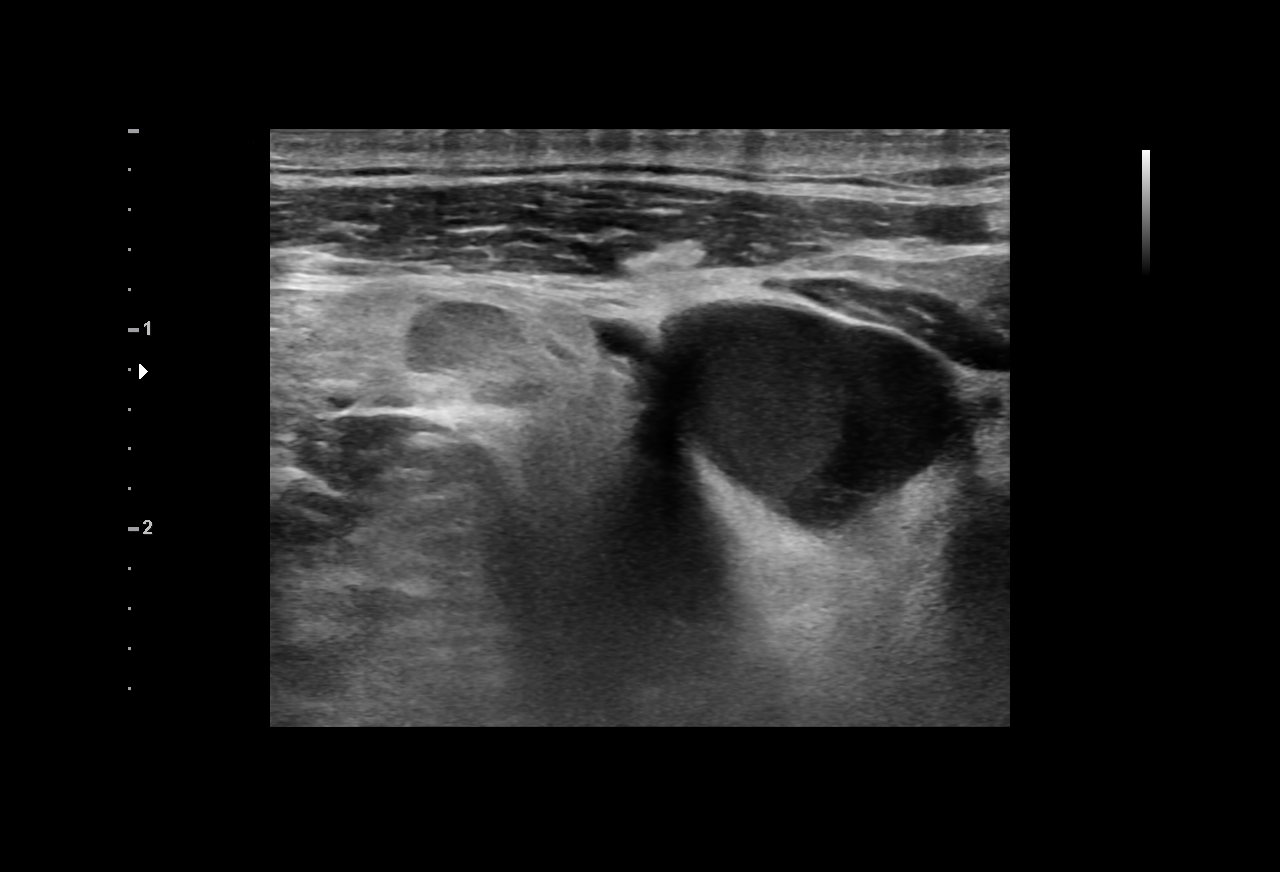
[im 2/3]
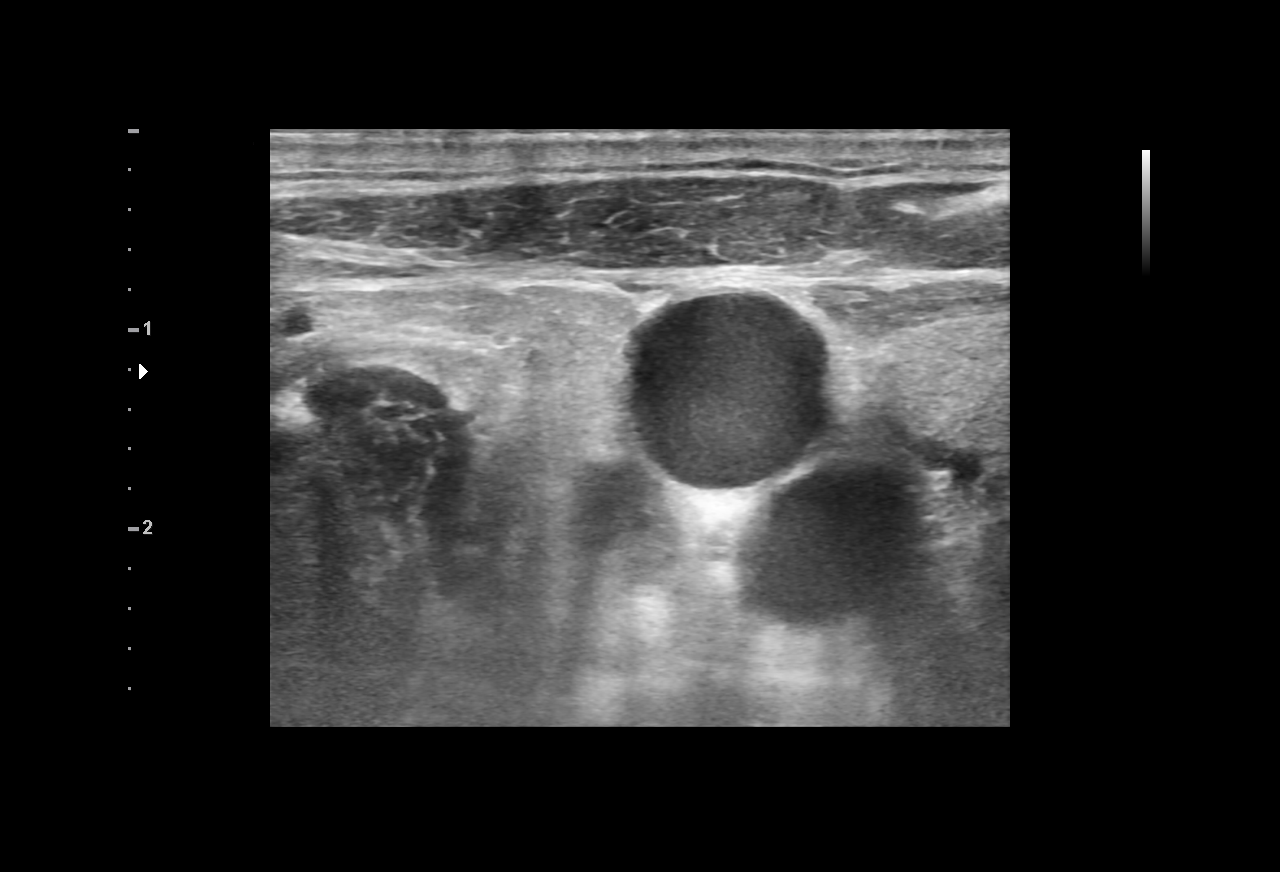
[im 3/3]
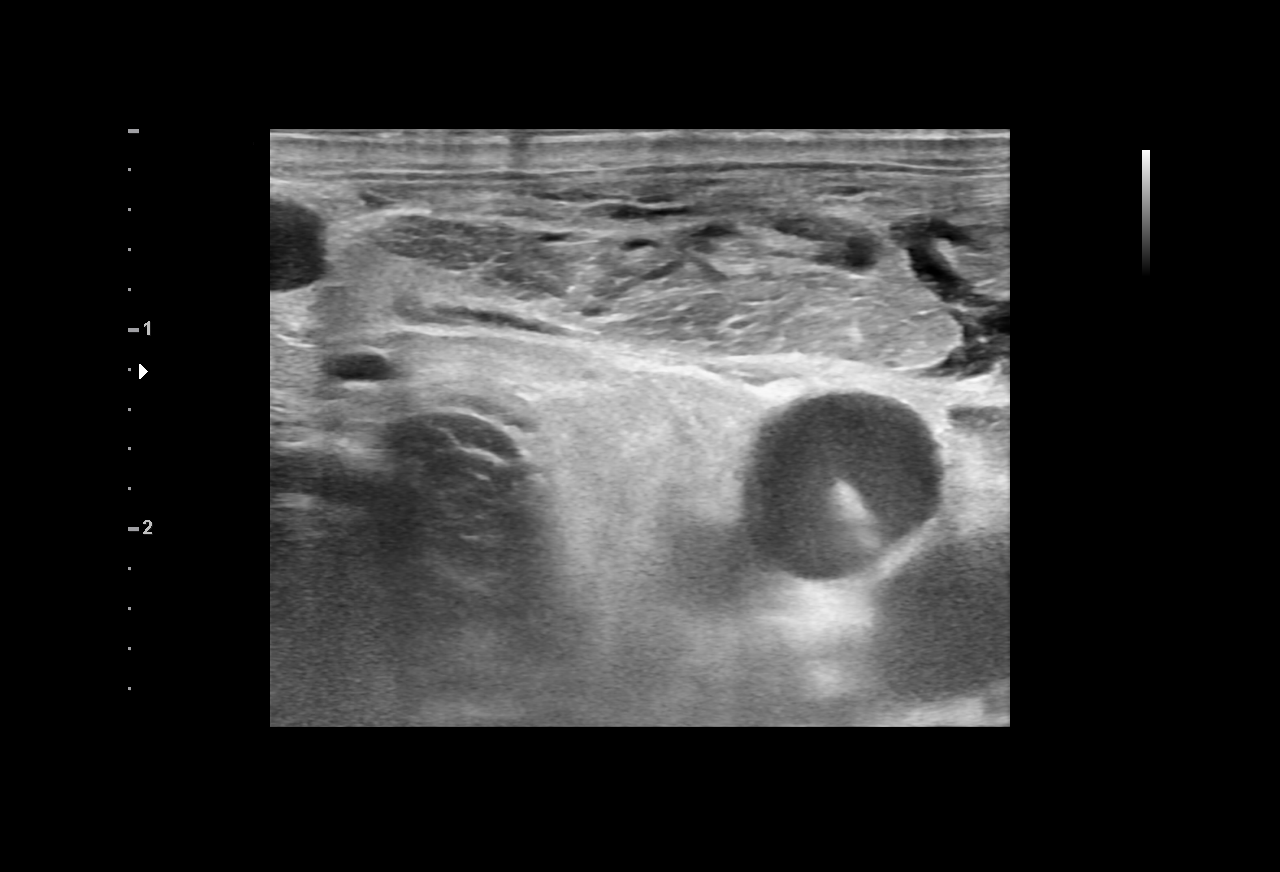

[3 of 3 positions shown; findings below may reference images not displayed]

EXAM:
FLUOROSCOPIC AND ULTRASOUND GUIDED PLACEMENT OF A NON-TUNNELED
DIALYSIS CATHETER

MEDICATIONS:
None

ANESTHESIA/SEDATION:
None

FLUOROSCOPY TIME:  Fluoroscopy Time: 6 seconds, 0.3 mGy

COMPLICATIONS:
None immediate.

PROCEDURE:
The procedure was explained to the patient. The risks and benefits
of the procedure were discussed and the patient's questions were
addressed. Informed consent was obtained from the patient. The
patient was placed supine on the interventional table. Ultrasound
confirmed a patent right internal jugular vein. Ultrasound images
were obtained for documentation. The right side of the neck was
prepped and draped in a sterile fashion. The right neck was
anesthetized with 1% lidocaine. Maximal barrier sterile technique
was utilized including caps, mask, sterile gowns, sterile gloves,
sterile drape, hand hygiene and skin antiseptic. A small incision
was made with #11 blade scalpel. A 21 gauge needle directed into the
right internal jugular vein with ultrasound guidance. A
micropuncture dilator set was placed. A 16 cm Mahurkar catheter was
selected. The catheter was advanced over a wire and positioned at
the superior cavoatrial junction. Fluoroscopic images were obtained
for documentation. Both dialysis lumens were found to aspirate and
flush well. The proper amount of heparin was flushed in both lumens.
The central venous lumen was flushed with normal saline. Catheter
was sutured to skin.
FINDINGS: Catheter tip at the superior cavoatrial junction.
IMPRESSION: Successful placement of a right jugular non-tunneled dialysis
catheter using ultrasound and fluoroscopic guidance.

## 2019-12-27 IMAGING — XA IR RIGHT FLUORO GUIDE CV LINE
1 series · 1 of 1 positions shown · non-contrast
Comparison: none

INDICATION: Chronic kidney disease and needing dialysis.

[Series 1: fl (-) angio · 1 of 1 slices shown]
[im 1/1]
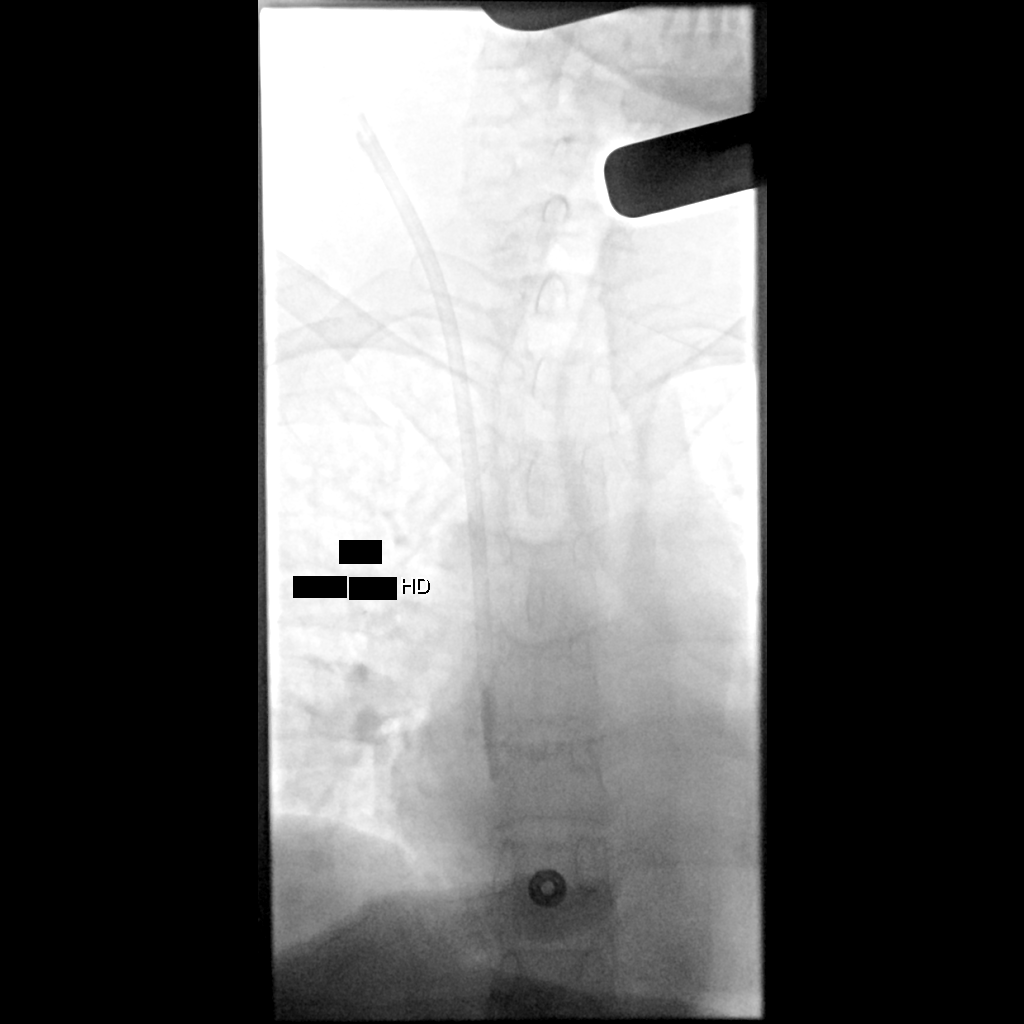

[1 of 1 positions shown; findings below may reference images not displayed]

EXAM:
FLUOROSCOPIC AND ULTRASOUND GUIDED PLACEMENT OF A NON-TUNNELED
DIALYSIS CATHETER

MEDICATIONS:
None

ANESTHESIA/SEDATION:
None

FLUOROSCOPY TIME:  Fluoroscopy Time: 6 seconds, 0.3 mGy

COMPLICATIONS:
None immediate.

PROCEDURE:
The procedure was explained to the patient. The risks and benefits
of the procedure were discussed and the patient's questions were
addressed. Informed consent was obtained from the patient. The
patient was placed supine on the interventional table. Ultrasound
confirmed a patent right internal jugular vein. Ultrasound images
were obtained for documentation. The right side of the neck was
prepped and draped in a sterile fashion. The right neck was
anesthetized with 1% lidocaine. Maximal barrier sterile technique
was utilized including caps, mask, sterile gowns, sterile gloves,
sterile drape, hand hygiene and skin antiseptic. A small incision
was made with #11 blade scalpel. A 21 gauge needle directed into the
right internal jugular vein with ultrasound guidance. A
micropuncture dilator set was placed. A 16 cm Mahurkar catheter was
selected. The catheter was advanced over a wire and positioned at
the superior cavoatrial junction. Fluoroscopic images were obtained
for documentation. Both dialysis lumens were found to aspirate and
flush well. The proper amount of heparin was flushed in both lumens.
The central venous lumen was flushed with normal saline. Catheter
was sutured to skin.
FINDINGS: Catheter tip at the superior cavoatrial junction.
IMPRESSION: Successful placement of a right jugular non-tunneled dialysis
catheter using ultrasound and fluoroscopic guidance.

## 2019-12-30 ENCOUNTER — Other Ambulatory Visit: Payer: Self-pay

## 2019-12-30 DIAGNOSIS — N186 End stage renal disease: Secondary | ICD-10-CM

## 2019-12-30 DIAGNOSIS — N185 Chronic kidney disease, stage 5: Secondary | ICD-10-CM

## 2020-01-01 IMAGING — DX PORTABLE CHEST - 1 VIEW
1 series · 1 of 1 positions shown · non-contrast
Comparison: Fluoroscopic spot view of the upper chest 08/01/2018.
Single-view of the chest 07/29/2018.

CLINICAL DATA: Status post placement of a dialysis catheter
08/01/2018.

EXAM:
PORTABLE CHEST 1 VIEW

[chest ap]
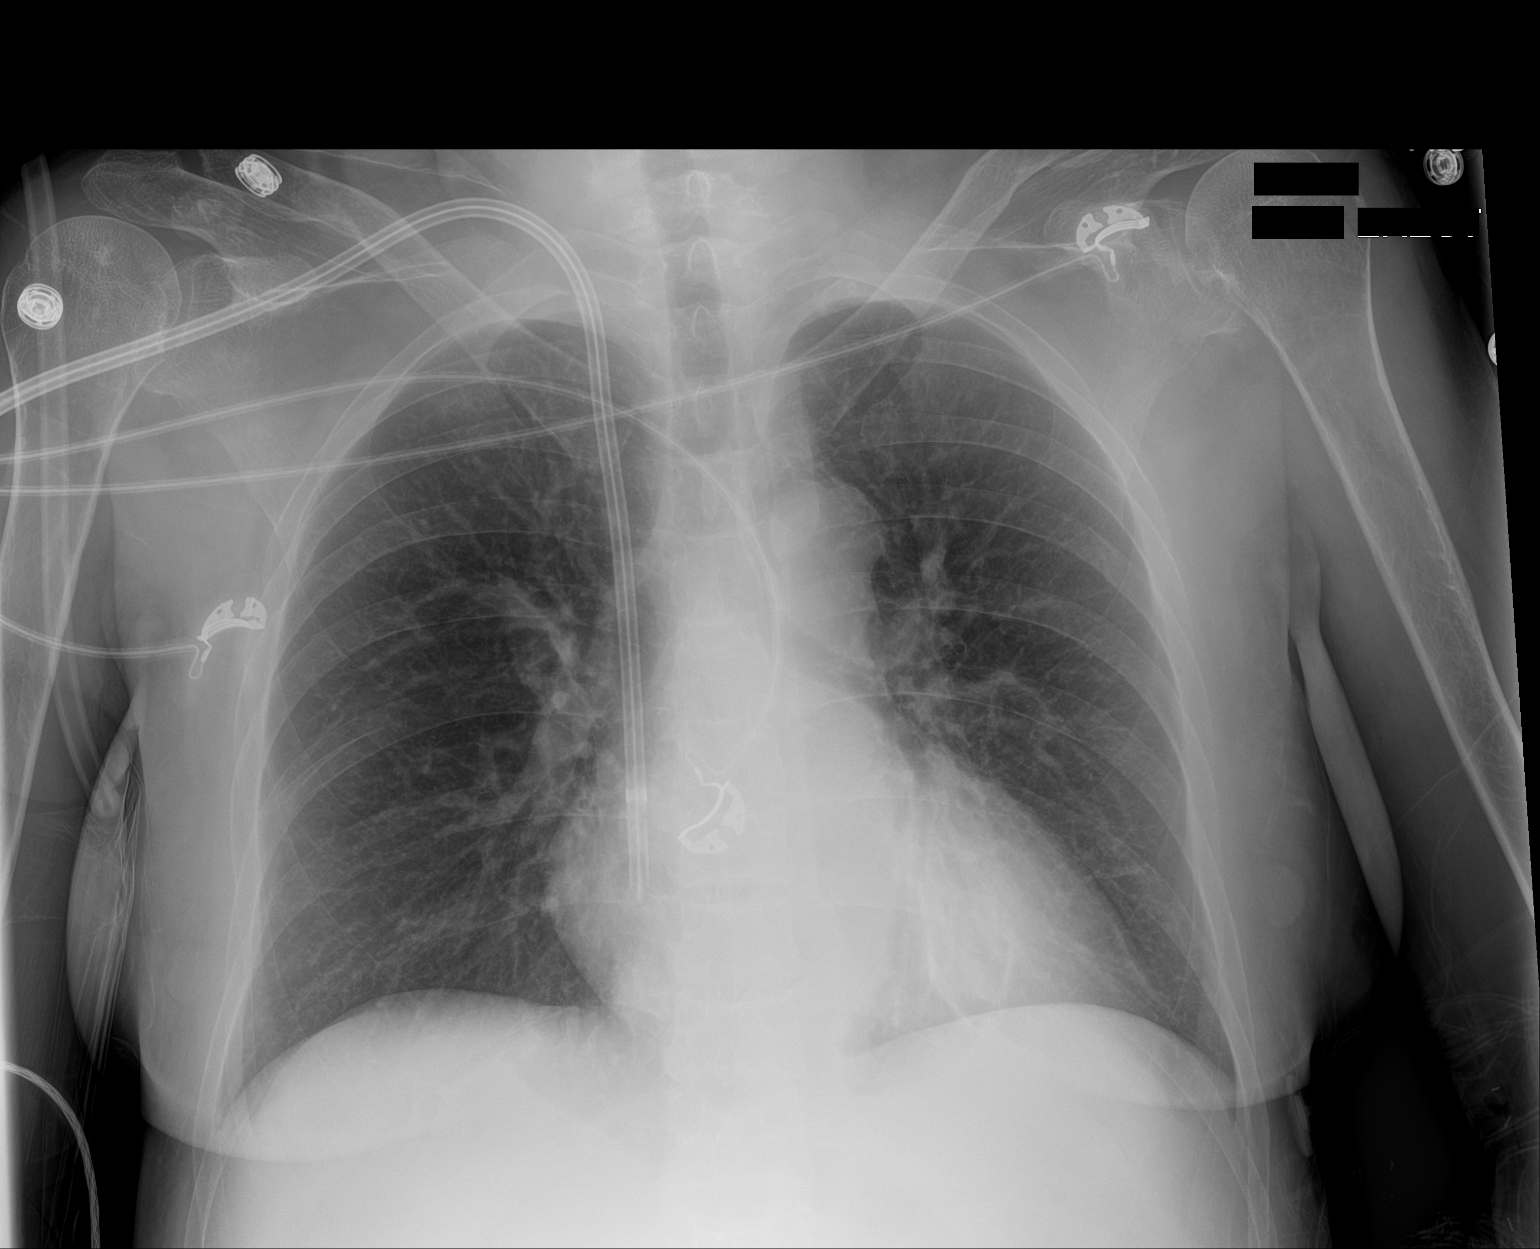

[1 of 1 positions shown; findings below may reference images not displayed]

FINDINGS: Right IJ approach double lumen central venous catheter tip projects
in the right atrium. Lungs are clear. No pneumothorax. Heart size is
normal. No pleural effusion. No focal bony abnormality.
IMPRESSION: Dialysis catheter tip projects in the right atrium. Negative for
pneumothorax or acute disease.

## 2020-01-04 ENCOUNTER — Other Ambulatory Visit: Payer: Self-pay

## 2020-01-04 ENCOUNTER — Encounter (HOSPITAL_BASED_OUTPATIENT_CLINIC_OR_DEPARTMENT_OTHER): Payer: Medicare HMO | Attending: Internal Medicine | Admitting: Internal Medicine

## 2020-01-04 DIAGNOSIS — E10621 Type 1 diabetes mellitus with foot ulcer: Secondary | ICD-10-CM | POA: Diagnosis not present

## 2020-01-04 DIAGNOSIS — L97514 Non-pressure chronic ulcer of other part of right foot with necrosis of bone: Secondary | ICD-10-CM | POA: Diagnosis not present

## 2020-01-04 DIAGNOSIS — Z9641 Presence of insulin pump (external) (internal): Secondary | ICD-10-CM | POA: Diagnosis not present

## 2020-01-04 DIAGNOSIS — M86671 Other chronic osteomyelitis, right ankle and foot: Secondary | ICD-10-CM | POA: Diagnosis not present

## 2020-01-04 DIAGNOSIS — Z89431 Acquired absence of right foot: Secondary | ICD-10-CM | POA: Diagnosis not present

## 2020-01-04 DIAGNOSIS — L97529 Non-pressure chronic ulcer of other part of left foot with unspecified severity: Secondary | ICD-10-CM | POA: Diagnosis present

## 2020-01-04 DIAGNOSIS — E1069 Type 1 diabetes mellitus with other specified complication: Secondary | ICD-10-CM | POA: Diagnosis not present

## 2020-01-04 DIAGNOSIS — L97411 Non-pressure chronic ulcer of right heel and midfoot limited to breakdown of skin: Secondary | ICD-10-CM | POA: Insufficient documentation

## 2020-01-04 DIAGNOSIS — Z9119 Patient's noncompliance with other medical treatment and regimen: Secondary | ICD-10-CM | POA: Diagnosis not present

## 2020-01-04 NOTE — Progress Notes (Signed)
SHAMONICA, SCHADT ([id number]) Visit Report for 01/04/2020 HPI Details Patient Name: Date of Service: [name] 01/04/2020 12:30 PM Medical Record Number: [id number] Patient Account Number: [id number] Date of Birth/Sex: Treating RN: 1971-01-13 (49 y.o. [name] Primary Care Provider: [name], [name] Other Clinician: Referring Provider: Treating Provider/Extender: [name], [name] Weeks in Treatment: 21 History of Present Illness HPI Description: 49 year old diabetic who is known to have type 1 diabetes which is poorly controlled last hemoglobin A1c was 11%. She comes in with a ulcerated area on the left lateral foot which has been there for over 6 months. Was recently she has been treated by [name] of podiatry who saw her last on 05/28/2016. Review of his notes revealed that the patient had incision and drainage with placement of antibiotic beads to the left foot on 04/11/2016 for possible osteomyelitis of the cuboid bone. Over the last year she's had a history of amputation of the left fifth toe and a femoropopliteal popliteal bypass graft somewhere in April 2017. 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 His note [name] mentions that the patient has been referred to me for further wound care and possibly great candidate for hyperbaric oxygen therapy due to recurrent osteomyelitis. However we do not have any x-rays of biopsy reports confirming this. He has been on several antibiotics including Bactrim and most recently is on doxycycline for an MRSA. I understand, the patient was not a candidate for IV antibiotics as she has had previous PICC lines which resulted in blood clots in both arms. There was a x-ray report dated 04/04/2016 on [name] notes which showed evidence of fifth ray resection left foot with osteolytic changes noted to the fourth metatarsal and cuboid bone on the left. 06/13/2016 -- had a left foot x-ray which  showed no acute fracture or dislocation and no definite radiographic evidence of osteomyelitis. Advanced osteopenia was seen. 06/20/2016 -- she has noticed a new wound on the right plantar foot in the region where she had a callus before. 06/27/16- the patient did have her x-ray of the right foot which showed no findings to suggest osteomyelitis. She saw her endocrinologist, [name], yesterday. Her A1c in January was 11. He also indicates mismanagement and noncompliance regarding her diabetes. She is currently on Bactrim for a lip infection. She is complaining of nausea, vomiting and diarrhea. She is unable to articulate the exact orders or dosing of the Bactrim; it is unclear when she will complete this. 07/04/2016 -- results from [facility] of ABIs with ankle waveforms were noted from 02/14/2016. The examination done on 06/27/2015 showed noncompressible ABIs with the right being 1.45 and the left being 1.33. The present examination showed a right ABI of 1.19 on the left of 1.33. The conclusion was that right normal ABI in the lower extremity at rest however compared to previous study which was noncompressible ABI may be falsely elevated side suggesting medial calcification. The left ABI suggested medial calcification. 08/01/2016 -- the patient had more redness and pain on her right foot and did not get to come to see as noted she see her PCP or go to the ER and decided to take some leftover metronidazole which she had at home. As usual, the patient does report she feels and is rather noncompliant. 08/08/2016 -- -- x-ray of the right foot -- FINDINGS:Transmetatarsal amputation is noted. No bony destruction is noted to suggest osteomyelitis. IMPRESSION: No evidence of  osteomyelitis. Postsurgical changes are seen. MRI would be more sensitive for possible bony changes. Culture has grown Serratia Marcescens -- sensitive to Bactrim, ciprofloxacin, ceftazidime she was seen by [name] on  08/06/2016. He did not find any exposed bone, muscle, tendon, ligament or joint. There was no malodor and he did a excisional debridement in the office. ============ Old notes: 49 year old patient who is known to the wound clinic for a while had been away from the wound clinic since 09/01/2014. Over the last several months she has been admitted to various hospitals including [facility] [place]. She was treated for a right metatarsal osteomyelitis with a transmetatarsal amputation and this was done about 2 months ago. He has a small ulcerated area on the right heel and she continues to have an ulcerated area on the left plantar aspect of the foot. The patient was recently admitted to the [facility] between 7/12 and 10/18/2014. she was given 3 weeks of IV vancomycin and was to follow-up with her surgeons at [facility] and also took oral vancomycin for C. difficile colitis. 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 MRI of the left foot done on 10/14/2014 showed no abscess or osteomyelitis. 04/27/15; this is a patient we know from previous stays in the wound care center. She is a type I diabetic I am not sure of her control currently. Since the last time I saw her she is had a right transmetatarsal amputation and has no wounds on her right foot and has no open wounds. She is been followed at the wound care center at [facility] in [place]. She comes today with the desire to undergo hyperbaric treatment locally. Apparently one of her wound care providers in [place] has suggested hyperbarics. This is in response to an MRI from 04/18/15 that showed increased marrow signal and loss of the proximal  fifth metatarsal cortex evidence of osteomyelitis with likely early osteomyelitis in the cuboid bone as well. She has a large wound over the base of the fifth metatarsal. She also has a eschar over her the tips of her toes on 1,3 and 5. She does not have peripheral pulses and apparently is going for an angiogram tomorrow which seems reasonable. After this she is going to infectious disease at [facility]. They have been using Medihoney to the large wound on the lateral aspect of the left foot to. The patient has known Charcot deformity from diabetic neuropathy. She also has known diabetic PAD. Surprisingly I can't see that she has had any recent antibiotics, the patient states the last antibiotic she had was at the end of November for 10 days. I think this was in response to culture that showed group G strep although I'm not exactly sure where the culture was from. She is also had arterial studies on 03/29/15. This showed a right ABI of 1.4 that was noncompressible. Her left ABI was 0.73. 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 It was not felt that arterial inflow was adequate for healing of a foot ulcer. Her Doppler waveforms looked monophasic ===== READMISSION 02/28/17; this is in an now 49 year old woman we've had at several different occasions in this clinic. 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 She has  a remote ex-smoker. She was last seen in this clinic by Dr. Con Memos I think in May. More recently she is been followed by her podiatrist [name] an infectious disease Dr. Megan Salon. She has 2 open wounds the major one is over the right first metatarsal head she also has a wound on the left plantar foot. an MRI of the right foot on 01/01/17 showed a soft tissue ulcer along the plantar aspect of the first metatarsal base consistent with osteomyelitis of the first metatarsal stump. Dr. 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 According to the patient this is been open for slightly over a month. She has been on a combination of Cipro 500 twice a day, Zyvox 600 twice a day and Flagyl 500 3 times a day for over a month now as directed by Dr. Megan Salon. cultures of the right foot earlier this year showed MRSA in January and Serratia in May. January also had a few viridans strep. Recent x-rays of both feet were done and [name] office and I don't have these reports. The patient has known PAD and has a history of aleft femoropopliteal bypass in April 2017. 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 Dwyane Dee. In spite of this the last hemoglobin A1c I can see is 10.1 on 01/01/2017. She is being referred by 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 She is been using Medihoney to this area and also an area on the left midfoot. She is using healing sandals bilaterally. ABIs in this clinic at the left posterior tibial was 1.1 noncompressible on the right READMISSION Non invasive vascular NOVANT 5/18 Aftercare following surgery of the circulatory system Procedure Note - Interface, External Ris In - 08/13/2016 11:05 AM EDT Procedure: Examination consists of physiologic resting arterial pressures of the brachial and ankle arteries bilaterally with continuous wave Doppler waveform analysis. Previous: Previous exam performed on 02/14/16 demonstrated ABIs of Rt = 1.19 and Lt = 1.33. Right: ABI = non-compressible PT 1.47 DP. S/P transmet amputation. , Left: ABI = 1.52, 2nd digit pressure = 87 mmHg Conclusions: Right: ABI (>1.3) may be falsely elevated, suggesting medial calcification. Left: ABI (>1.3) may be falsely  elevated, suggesting medial calcification The patient is a now 49 year old type I diabetic is had multiple issues her graded to chronic diabetic foot ulcers. 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 We had her in the clinic lastin November. At that point she had wounds on her bilateral feet.she had wanted to try hyperbarics however the healogics review process denied her because she hadn't followed up with her vascular surgeon for her left femoropopliteal bypass. The bypass was done by Dr. Raul Del at [facility]. We made her a follow-up with Dr. Raul Del however she did not keep the appointment and therefore she was not approved The patient shows me a small wound on her left fourth metatarsal head on her phone. She developed rapid discoloration in the plantar aspect of the left foot and she was admitted to hospital from 2/2 through 05/10/17 with wet gangrene of the left foot osteomyelitis of the fourth metatarsal heads. She was admitted acutely ill with a temperature of 103.  She was started on broad-spectrum vancomycin and cefepime. On 05/06/17 she was taken to the OR by [name] her podiatric surgeon for an incision and drainage irrigation of the left foot wound. Cultures from this surgery revealed group be strep and anaerobes. she was seen by Dr.Xu of orthopedic surgery and scheduled for a below-knee amputation which she u refused. Ultimately she was discharged on Levaquin and Flagyl for one month. MRI 05/05/17 done while she was in the hospital showed abscess adjacent to the fourth metatarsal head and neck small abscess around the fourth flexor tendon. Inflammatory phlegmon and gas in the soft tissues along the lateral aspect of the fourth phalanx. Findings worrisome for osteomyelitis involving the fourth proximal and middle phalanx and also the third and fourth metatarsals. Finally the patient had actually shortly  before this followed up with Dr. Raul Del at no time on 04/29/17. He felt that her left femoropopliteal bypass was patent he felt that her left-sided toe pressures more than adequate for healing a wound on the left foot. This was before her acute presentation. 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 05/28/17; she is started hyperbarics. The patient tells me that for some reason she was not actually on Levaquin but I think on ciprofloxacin. She was on Flagyl. She only started her Levaquin yesterday due to some difficulty with the pharmacy and perhaps her sister picking it up. She has an appointment with [name] tomorrow and with infectious disease early next week. She has no new complaints 06/06/17; the patient continues in hyperbarics. She saw [name] on 05/29/17 who is her podiatric surgeon. He is elected for a transmetatarsal amputation on 06/27/17. I'm not sure at what level he plans to do this amputation. The patient is unaware She also saw Dr. Megan Salon of infectious disease who elected to continue her on current antibiotics I think this is ciprofloxacin and Flagyl. I'll need to clarify with her tomorrow if she actually has this. We're using silver alginate to the actual wound. Necrotic surface today with material under the flap of her foot. Original MRI showed abscesses as well as osteomyelitis of the proximal and middle fourth phalanx and the third and fourth metatarsal heads 06/11/17; patient continues in hyperbarics and continues on oral antibiotics. She is doing well. The wound looks better. The necrotic part of this under the flap in her superior foot also looks better. she is been to see [name]. I haven't had a chance to look at his note. Apparently he has put the transmetatarsal amputation on hold her request it is still planning to take her to the OR for debridement and product application ACEL. I'll see if I can find his note. I'll therefore leave product ordering/requests to [name]  for now. I was going to look at Dermagraft 06/18/17-she is here in follow-up evaluation for bilateral foot wounds. She continues with hyperbaric therapy. She states she has been applying manuka honey to the right plantar foot and alternate manuka honey and silver alginate to the left foot, despite our orders. We will continue with same treatment plan and she will follo up next week. 06/25/17; I have reviewed [name] last note from 3/11. She has operative debridement in 2 days' time. By review his note apparently they're going to place there is skin over the majority of this wound which is a good choice. She has a small satellite area at the most proximal part of this wound on the left plantar foot. The area on the right  plantar foot we've been using silver alginate and it is close to healing. 07/02/17; unfortunately the patient was not easily approved for [name] proposed surgery. I'm not completely certain what the issue is. She has been using silver alginate to the wound she has completed a first course of hyperbarics. She is still on Levaquin and Flagyl. I have really lost track of the time course here.I suspect she should have another week to 2 of antibiotics. I'll need to see if she is followed up with infectious disease Dr. Megan Salon 07/09/17; the patient is followed up with Dr. Megan Salon. 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 She continues on Levaquin and metronidazole continuing both of these for now I think she is been on fr about 6 weeks. She still has some drainage but no pain. No fever. Her had been plans for her to go to the OR for operative debridement with her podiatrist [name], I am not exactly sure where that is. I'll probably slip a note to [name] today. I note that she follows with Dr. Dwyane Dee of endocrinology. We have her recertified for hyperbaric oxygen. I have not heard about Dermagraft however I'll see if [name] is planning a  skin substitute as well 07/16/17; the patient tells me she is just about out of [place]. I'll need to check Dr. Hale Bogus last notes on this. She states she has plenty of Flagyl however. She comes in today complaining of pain in the right lateral foot which she said lasted for about a day. The wound on the right foot is actually much more medially. She also tells me that the [facility] cost a lot of pain in the left foot wound and she turned back to silver alginate. Finally Dermagraft has a $481 per application co-pay. She cannot afford this 07/23/17; patient arrives today with the wound not much smaller. There is not much new to add. She has not heard from [name] all try to put in a call to them today. She was asking about Dermagraft again and she has an over $856 per application co-pay she states that she would be willing to try to do a payment plan. I been tried to avoid this. We've been using silver alginate, I'll change to [facility] 07/30/17-She is here in follow-up evaluation for left foot ulcer. She continues hyperbaric medicine. The left foot ulcer is stable we will continue with same treatment plan 08/06/17; she is here for evaluation of her left foot ulcer. Currently being treated for hyperbarics or underlying osteomyelitis. She is completed antibiotics. The left foot ulcer is better smaller with healthier looking granulation. For various reasons I am not really clear on we never got her back to the OR with [name]. He did not respond to my secure text message. Nevertheless I think that surgery on this point is not necessary nor am I completely clear that a skin substitute is necessary The patient is complaining about pain on the outside of her right foot. 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 There is no erythema. She also states the foot is warm versus her other part of her upper leg and this is largely true. It is not totally clear to me what's causing this. She  thinks it's different from her usual neuropathy pain 08/13/17; she arrives in clinic today with a small wound which is superficial on her right first metatarsal head. 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 She tells [place] she was up on her feet over the Mother's Day celebration. The large wound is on the left foot. Continues with hyperbarics for underlying osteomyelitis. We're using Hydrofera Blue. She asked me today about where we were with Dermagraft. I had actually excluded this because of the co-pay however she wants to assume this therefore I'll recheck the co-pay an order for next week. 08/20/17; the patient agreed to accept the co-pay of the first Apligraf which we applied today. She is disappointed she is finishing hyperbarics will run this through the insurance on the extent of the foot infection and the extent of the wound that she had however she is already had 60 dive's. Dermagraft No. 1 08/27/17; Dermagraft No. 2. She is not eligible for any more hyperbaric treatments this month. She reports a fair amount of drainage and she actually changed to the external dressings without disturbing the direct contact layer 09/03/17; the patient arrived in clinic today with the wound superficially looking quite healthy. Nice vibrant red tissue with some advancing epithelialization although not as much adherence of the flap as I might like. However she noted on her own fourth toe some bogginess and she brought that to our attention. Indeed this was boggy feeling like a possibility of subcutaneous fluid. 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 She is not been unwell. We've been using Dermagraft 09/10/17; the culture that I did not last week was MRSA. She saw Dr. Megan Salon this morning who is going to start her on vancomycin. I had sent him a secure a text message yesterday. I also spoke with her podiatric surgeon  [name] about surgery on this foot the options for conserving a functional foot etc. Promised me he would see her and will make back consultation today. Paradoxically her actual wound on the plantar aspect of her left foot looks really quite good. I had given her 5 days worth of Baxdella to cover her for MRSA. Her MRI came back showing osteomyelitis within the third metatarsal shaft and head and base of the third and fourth proximal phalanx. She had extensive inflammatory changes throughout the soft tissue of the lateral forefoot. With an ill-defined fluid around the fourth metatarsal extending into the plantar and dorsal soft tissues 09/19/17; the patient is actually on oral Septra and Flagyl. She apparently refused IV vancomycin. She also saw [name] at my request who is planning her for a left BKA sometime in mid July. MRI showed osteomyelitis within the third metatarsal shaft and head and the basis of the third and fourth proximal phalanx. I believe there was felt to be possible septic arthritis involving the third MTP. 09/26/17; the patient went back to Dr. Megan Salon at my suggestion and is now receiving IV daptomycin. Her wound continues to look quite good making the decision to proceed with a transmetatarsal amputation although more difficult for the patient. I believe in my extensive discussions with her she has a good sense of the pros and cons of this. I don't NV the tuft decision she has to make. She has an appointment with [name] I believe in mid July and I previously spoken to him about this issue Has we had used 3 previous Dermagraft. Given the condition of the wound surface I went ahead and added the fourth one today area and I did this not fully realizing that she'll be traveling  to [place] next week. I'm hopeful she can come back in 2 weeks 10/21/17; Her same Dermagraft on for about 3-1/2 weeks. In spite of this the wound arrives looking quite healthy. There is been a lot of  healing dimensions are smaller. Looking at the square shaped wound she has now there is some undermining and some depth medially under the undermining although I cannot palpate any bone. No surrounding infection is obvious. She has difficult questions about how to look at this going forward vis--vis amputations versus continued medical therapy. T be truthful the wound is looks o so healthy and it is continued to contract. Hard to justify foot surgery at this point although I still told her that I think it might come to that if we are not able to eradicate the underlying MRSA. She is still highly at risk and she understands this 11/06/17 on evaluation today patient appears to be doing better in regard to her foot ulcer. She's been tolerating the dressing changes without complication. Currently she is here for her Dermagraft #6. Her wound continues to make excellent progress at this point. She does not appear to have any evidence of infection which is good news. 11/13/17 on evaluation today patient appears to be doing excellent at this time. She is here for repeat Dermagraft application. This is #7. Overall her wound seems to be making great progress. 12/05/17; the patient arrives with the wound in much better condition than when I last saw this almost 6 weeks ago. She still has a small probing area in the left metatarsal head region on the lateral aspect of her foot. We applied her last Dermagraft today. Since the last time she is here she has what appears to a been a blood blister on the plantar aspect of left foot although I don't see this is threatening. There is also a thick raised tissue on the right mid metatarsal head region. This was not there I don't think the last time she was here 3 weeks ago. 12/12/17; the patient continues to have a small programming area in the left metatarsal head region on the lateral aspect of her foot which was the initial large surgical wound. I applied her last Apligraf  last week. I'm going to use Endoform starting today Unfortunately she has an excoriated area in the left mid foot and the right mid foot. The left midfoot looks like a blistered area this was not opened last week it certainly is open today. Using silver alginate on these areas. She promises me she is offloading this. 12/19/17; the small probing area in the left metatarsal head eyes think is shallower. In general her original wound looks better. We've been using Endoform. The area inferiorly that I think was trauma last week still requires debridement a lot of nonviable surface which I removed. She still has an open open area distally in her foot Similarly on the right foot there is tightly adherent surface debris which I removed. Still areas that don't look completely epithelialized. This is a small open area. We used silver alginate on these areas 12/26/2017; the patient did not have the supplies we ordered from last week including the Endoform. The original large wound on the left lateral foot looks healthy. She still has the undermining area that is largely unchanged from last week. She has the same heavily callused raised edged wounds on the right mid and left midfoot. Both of these requiring debridement. We have been using silver alginate on these areas 01/02/2018;  there is still supply issues. We are going to try to use Prisma but I am not sure she actually got it from what she is saying. She has a new open area on the lateral aspect of the left fourth toe [previous fifth ray amputation]. Still the one tunneling area over the fourth metatarsal head. The area is in the midfoot bilaterally still have thick callus around them. She is concerned about a raised swelling on the lateral aspect of the foot. However she is completely insensate 01/10/2018; we are using Prisma to the wounds on her bilateral feet. Surprisingly the tunneling area over the left fourth metatarsal head that was part of  her original surgery has closed down. She has a small open area remaining on the incision line. 2 open areas in the midfoot. 02/10/2018; the patient arrives back in clinic after a month hiatus. She was traveling to visit family in [place]. Is fairly clear she was not offloading the areas on her feet. The original wound over the left lateral foot at the level of metatarsal heads is reopened and probes medially by about a centimeter or 2. She notes that a week ago she had purulent drainage come out of an area on the left midfoot. Paradoxically the worst area is actually on the right foot is extensive with purulent drainage. We will use silver alginate today 02/17/2018; the patient has 3 wounds one over the left lateral foot. She still has a small area over the metatarsal heads which is the remnant of her original surgical wound. This has medial probing depth of roughly 1.4 cm somewhat better than last week. The area on the right foot is larger. We have been using silver alginate to all areas. The area on the right foot and left foot that we cultured last week showed both Klebsiella and Proteus. Both of these are quinolone sensitive. The patient put her's self on Bactrim and Flagyl that she had left hanging around from prior antibiotic usages. She was apparently on this last week when she arrived. I did not realize this. Unfortunately the Bactrim will not cover either 1 of these organisms. We will send in Cipro 500 twice daily for a week 03/04/2018; the patient has 2 wounds on the left foot one is the original wound which was a surgical wound for a deep DFU. At one point this had exposed bone. She still has an area over the fourth metatarsal head that probes about 1.4 cm although I think this is better than last week. I been using silver nitrate to try and promote tissue adherence and been using silver alginate here. She also has an area in the left midfoot. This has some depth but a small linear wound.  Still requiring debridement. On the right midfoot is a circular wound. A lot of thick callus around this area. We have been using silver alginate to all wound areas She is completed the ciprofloxacin I gave her 2 weeks ago. 03/11/2018; the patient continues to have 2 open areas on the left foot 1 of which was the original surgical wound for a deep DFU. Only a small probing area remains although this is not much different from last week we have been using silver alginate. The other area is on the midfoot this is smaller linear but still with some depth. We have been using silver alginate here as well On the right foot she has a small circular wound in the mid aspect. This is not much smaller than last  time. We have been using silver alginate here as well 03/18/2018; she has 3 wounds on the left foot the original surgical wound, a very superficial wound in the mid aspect and then finally the area in the mid plantar foot. She arrives in today with a very concerning area in the wound in the mid plantar foot which is her most proximal wound. There is undermining here of roughly 1-1/2 cm superiorly. Serosanguineous drainage. She tells me she had some pain on for over the weekend that shot up her foot into her thigh and she tells me that she had a nodule in the groin area. She has the single wound in the right foot. We are using endoform to both wound areas 03/24/2018; the patient arrives with the original surgical wound in the area on the left midfoot about the same as last week. There is a collection of fluid under the surface of the skin extending from the surgical wound towards the midfoot although it does not reach the midfoot wound. The area on the right foot is about the same. Cultures from last week of the left midfoot wound showed abundant Klebsiella abundant Enterococcus faecalis and moderate methicillin resistant staph I gave her Levaquin but this would have only covered the Klebsiella. She  will need linezolid 04/01/2018; she is taking linezolid but for the first few days only took 1 a day. I have advised her to finish this at twice daily dosing. In any case all of her wounds are a lot better especially on the left foot. The original surgical wound is closed. The area on the left midfoot considerably smaller. The area on the right foot also smaller. 04/08/2018; her original surgical wound/osteomyelitis on the left foot remains closed. She has area on the left foot that is in the midfoot area but she had some streaking towards this. This is not connected with her original wound at least not visually. Small wound on the right midfoot appears somewhat smaller. 04/15/18; both wounds looks better. Original wound is better left midfoot. Using silver alginate 1/21; patient states she uses saltwater soak in, stones or remove callus from around her wounds. She is also concerned about a blood blister she had on the left foot but it simply resolved on its own. We've been using silver alginate 1/28; the patient arrives today with the same streaking area from her metatarsals laterally [the site of her original surgical wound] down to the middle of her foot. There is some drainage in the subcutaneous area here. This concerns me that there is actually continued ongoing infection in the metatarsals probably the fourth and third. This fixates an MRI of the foot without contrast [chronic renal failure] The wound in the mid part of the foot is small but I wonder whether this area actually connects with the more distal foot. The area on the right midfoot is probably about the same. Callus thick skin around the small wound which I removed with a curette we have been using silver alginate on both wound areas 2/4; culture I did of the draining site on the left foot last time grew methicillin sensitive staph aureus. MRI of the left foot showed interval resolution of the findings surrounding the third metatarsal  joint on the prior study consistent with treated osteomyelitis. Chronic soft tissue ulceration in the plantar and lateral aspect of the forefoot without residual focal fluid collection. No evidence of recurrent osteomyelitis. Noted to have the previous amputation of the distal first phalanx and fifth  ray MRI of the right foot showed no evidence of osteomyelitis I am going to treat the patient with a prolonged course of antibiotics directed against MSSA in the left foot 2/11; patient continues on cephalexin. She tells me she had nausea and vomiting over the weekend and missed 2 days. In general her foot looks much the same. She has a small open area just below the left fourth metatarsal head. A linear area in the left midfoot. Some discoloration extending from the inferior part of this into the left lateral foot although this appears to be superficial. She has a small area on the right midfoot which generally looks smaller after debridement 2/18; the patient is completing his cephalexin and has another 2 days. She continues to have open areas on the left and right foot. 2/25; she is now off antibiotics. The area on the left foot at the site of her original surgical wound has closed yet again. She still has open areas in the mid part of her foot however these appear smaller. The area on the right mid foot looks about the same. We have been using silver alginate She tells me she had a serious hypoglycemic spell at home. She had to have EMS called and get IV dextrose 3/3; disappointing on the left lateral foot large area of necrotic tissue surrounding the linear area. This appears to track up towards the same original surgical wound. Required extensive debridement. The area on the right plantar foot is not a lot better also using silver 3/12; the culture I did last time showed abundant enterococcus. I have prescribed Augmentin, should cover any unrecognized anaerobes as well. In addition there were a few  MRSA and Serratia that would not be well covered although I did not want to give her multiple antibiotics. She comes in today with a new wound in the right midfoot this is not connected with the original wound over her MTP a lot of thick callus tissue around both wounds but once again she said she is not walking on these areas 3/17-Patient comes in for follow-up on the bilateral plantar wounds, the right midfoot and the left plantar wound. Both these are heavily callused surrounding the wounds. We are continuing to use silver alginate, she is compliant with offloading and states she uses a wheelchair fairly often at home 3/24; both wound areas have thick callus. However things actually look quite a bit better here for the majority of her left foot and the right foot. 3/31; patient continues to have thick callused somewhat irritated looking tissue around the wounds which individually are fairly superficial. There is no evidence of surrounding infection. We have been using silver alginate however I change that to [facility] today 4/17; patient returns to clinic after having a scare with Covid she tested negative in her primary doctor's office. She has been using Hydrofera Blue. She does not have an open area on the right foot. On the left foot she has a small open area with the mid area not completely viable. She showed me pictures of what looks like a hemorrhagic blister from several days ago but that seems to have healed over this was on the lateral left foot 4/21; patient comes in to clinic with both her wounds on her feet closed. However over the weekend she started having pain in her right foot and leg up into the thigh. She felt as though she was running a low-grade fever but did not take her temperature. She took a  doxycycline that she had leftover and yesterday a single Septra and metronidazole. She thinks things feel somewhat better. 4/28; duplex ultrasound I ordered last week was negative  for DVT or superficial thrombophlebitis. She is completed the doxycycline I gave her. States she is still having a lot of pain in the right calf and right ankle which is no better than last week. She cannot sleep. She also states she has a temperature of up to 101, coughing and complaining of visual loss in her bilateral eyes. Apparently she was tested for Covid 2 weeks ago at [facility] and that was negative. Readmission: 09/03/18 patient presents back for reevaluation after having been evaluated at the end of April regarding erythema and swelling of her right lower extremity. Subsequently she ended up going to the hospital on 07/29/18 and was admitted not to be discharged until 08/08/18. Unfortunately it was noted during the time that she was in the hospital that she did have methicillin-resistant Staphylococcus aureus as the infection noted at the site. It was also determined that she did have osteomyelitis which appears to be fairly significant. She was treated with vancomycin and in fact is still on IV vancomycin at dialysis currently. This is actually slated to continue until 09/12/18 at least which will be the completion of the six weeks of therapy. Nonetheless based on what I'm seeing at this point I'm not sure she will be anywhere near ready to discontinue antibiotics at that time. Since she was released from the hospital she was seen by [name] who is her podiatrist on 08/27/18. His note specifically states that he is recommended that the patient needs of one knee amputation on the right as she has a life- threatening situation that can lead quickly to sepsis. The patient advised she would like to try to save her leg to which [name] apparently told her that this was against all medical advice. She also want to discontinue the Wound VAC which had been initiated due to the fact that she wasn't pleased with how the wound was looking and subsequently she wanted to pursue applying Medihoney at that time.  He stated that he did not believe that the right lower extremity was salvageable and that the patient understood but would still like to attempt hyperbaric option therapy if it could be of any benefit. She was therefore referred back to [place] for further evaluation. He plans to see her back next week. Upon inspection today patient has a significant amount purulent drainage noted from the wound at this point. The bone in the distal portion of her foot also appears to be extremely necrotic and spongy. When I push down on the bone it bubbles and seeps purulent drainage from deeper in the end of the foot. I do not think that this is likely going to heal very well at all and less aggressive surgical debridement were undertaken more than what I believe we can likely do here in our office. 09/12/2018; I have not seen this patient since the most recent hospitalization although she was in our clinic last week. I have reviewed some of her records from a complex hospitalization. 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 There is situation was complicated by MRSA bacteremia and acute on chronic renal failure now on dialysis. She is receiving vancomycin at dialysis. We started her on Dakin's wet-to-dry last week she is changing this daily. There is still purulent drainage coming out of her foot.  Although she is apparently "agreeable" to a below-knee amputation which is been suggested by multiple clinicians she wants this to be done in [place]. She apparently has a telehealth visit with that provider sometime in late Tye 6/24. I have told her I think this is probably too long. Nevertheless I could not convince her to allow a local doctor to perform BKA. 09/19/2018; the patient has a large necrotic area on the right anterior foot. She has had previous transmetatarsal amputations. Culture I did last week showed MRSA nothing else she is on vancomycin at dialysis. She  has continued leaking purulent drainage out of the distal part of the large circular wound on the right anterior foot. She apparently went to see Dr. 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 Somehow that translated into her being referred to plastic surgery for debridement of the area. I gather she basically refused amputation although I do not have a copy of Dr. Berenice Primas notes. The patient really wants to have a trial of hyperbaric oxygen. I agreed with initial assessment in this clinic that this was probably too far along to benefit however if she is going to have plastic surgery I think she would benefit from ancillary hyperbaric oxygen. The issue here is that the patient has benefited as maximally as any patient I have ever seen from hyperbaric oxygen therapy. Most recently she had exposed bone on the lateral part of her left foot after a surgical procedure and that actually has closed. She has eschared areas in both heels but no open area. She is remained systemically well. I am not optimistic that anything can be done about this but the patient is very clear that she wants an attempt. The attempt would include a wound VAC further debridements and hyperbaric oxygen along with IV antibiotics. 6/26; I put her in for a trial of hyperbaric oxygen only because of the dramatic response she has had with wounds on her left midfoot earlier this year which was a surgical wound that went straight to her bone over the metatarsal heads and also remotely the left third toe. We will see if we can get this through our review process and insurance. She arrives in clinic with again purulent material pouring out of necrotic bone on the top of the foot distally. There is also some concerning erythema on the front of the leg that we marked. It is bit difficult to tell how tender this is because of neuropathy. I note from infectious disease that she had her vancomycin extended. All the  cultures of these areas have shown MRSA sensitive to vancomycin. She had the wound VAC on for part of the week. The rest of the time she is putting various things on this including Medihoney, "ionized water" silver sorb gel etc. 7/7; follow-up along with HBO. She is still on vancomycin at dialysis. She has a large open area on the dorsal right foot and a small dark eschar area on her heel. There is a lot less erythema in the area and a lot less tenderness. From an infection point of view I think this is better. She still has a lot of necrosis in the remaining right forefoot [previous TMA] we are still using the wound VAC in this area 7/16; follow-up along with HBO. I put her on linezolid after she finished her vancomycin. We started this last Friday I gave her 2 weeks worth. I had the expectation that she would be operatively debrided by Dr. Marla Roe  but that still has not happened yet. Patient phoned the office this week. She arrives for review today after HBO. The distal part of this wound is completely necrotic. Nonviable pieces of tendon bone was still purulent drainage. Also concerning that she has black eschar over the heel that is expanding. I think this may be indicative of infection in this area as well. She has less erythema and warmth in the ankle and calf but still an abnormal exam 7/21 follow-up along with HBO. I will renew her linezolid after checking a CBC with differential monitoring her blood counts especially her platelets. She was supposed to have surgery yesterday but if I am reading things correctly this was canceled after her blood sugar was found to be over 500. I thought Dr. Marla Roe who called me said that they were sending her to the ER but the patient states that was not the case. 7/28. Follow-up along with HBO. She is on linezolid I still do not have any lab work from dialysis even though I called last week. The patient is concerned about an area on her left lateral  foot about the level of the base of her fifth metatarsal. I did not really see anything that ominous here however this patient is in [place] ability to point out problems that she is sensing and she has been accurate in the past Finally she received a call from Dr. Marla Roe who is referring her to another orthopedic surgeon stating that she is too booked up to take her to the operating room now. Was still using a wound VAC on the foot 8/3 -Follow-up after HBO, she is got another week of linezolid, she is to call ID for an appointment, x-rays of both feet were reviewed, the left foot x-ray with third MTP joint osteo- Right foot x-ray widespread osteo-in the right midfoot Right ankle x-ray does not show any active evidence of infection 8/11-Patient is seen after HBO, the wounds on the right foot appear to be about the same, the heel wound had some necrotic base over tendon that was debrided with a curette 8/21; patient is seen after HBO. The patient's wound on her dorsal foot actually looks reasonably good and there is substantial amount of epithelialization however the open area distally still has a lot of necrotic debris partially bone. I cannot really get a good sense of just how deep this probes under the foot. She has been pressuring me this week to order medical maggots through a company in [place] for her. The problem I have is there is not a defined wound area here. On the positive side there is no purulence. She has been to see infectious disease she is still on Septra DS although I have not had a chance to review their notes 8/28; patient is seen in conjunction with HBO. The wounds on her foot continued to improve including the right dorsal foot substantially the, the distal part of this wound and the area on the right heel. We have been using a wound VAC over this chronically. She is still on trimethoprim as directed by infectious disease 9/4; patient is seen in conjunction with HBO.  Right dorsal foot wound substantially anteriorly is better however she continues to have a deep wound in the distal part of this that is not responding. We have been using silver collagen under border foam Area on the right plantar medial heel seems better. We have been using Hydrofera Blue 12/12/18 on evaluation today patient appears to be  doing about the same with regard to her wound based on prior measurements. She does have some necrotic tissue noted on the lateral aspect of the wound that is going require a little bit of sharp debridement today. This includes what appears to be potentially either severely necrotic bone or tendon. Nonetheless other than that she does not appear to have any severe infection which is good news 9/18; it is been 2 weeks since I saw this wound. She is tolerating HBO well. Continued dramatic improvement in the area on the right dorsal foot. She still has a small wound on the heel that we have been using Hydrofera Blue. She continues with a wound VAC 9/24; patient has to be seen emergently today with a swelling on her right lateral lower leg. She says that she told Dr. Evette Doffing about this and also myself on a couple of occasions but I really have no recollection of this. She is not systemically unwell and her wound really looked good the last time I saw this. She showed this to providers at dialysis and she was able to verify that she was started on cephalexin today for 5 doses at dialysis. She dialyzes on Tuesday Thursday and Saturday. 10/2; patient is seen in conjunction with HBO. The area that is draining on the right anterior medial tibia is more extensive. Copious amounts of serosanguineous drainage with some purulence. We are still using the wound VAC on the original wound then it is stable. Culture I did of the original IandD showed MRSA I contacted dialysis she is now on vancomycin with dialysis treatments. I asked them to run a month 10/9; patient seen in  conjunction with HBO. She had a new spontaneous open area just above the wound on the right medial tibia ankle. More swelling on the right medial tibia. Her wound on the foot looks about the same perhaps slightly better. There is no warmth spreading up her leg but no obvious erythema. her MRI of the foot and ankle and distal tib-fib is not booked for next Friday I discussed this with her in great detail over multiple days. it is likely she has spreading infection upper leg at least involving the distal 25% above the ankle. She knows that if I refer her to orthopedics for infectious disease they are going to recommend amputation and indeed I am not against this myself. We had a good trial at trying to heal the foot which is what she wanted along with antibiotics debridement and HBO however she clearly has spreading infection [probably staph aureus/MRSA]. Nevertheless she once again tells me she wants to wait the left of the MRI. She still makes comments about having her amputation done in [place]. 10/19; arrives today with significant swelling on the lateral right leg. Last culture I did showed Klebsiella. Multidrug-resistant. Cipro was intermediate sensitivity and that is what I have her on pending her MRI which apparently is going to be done on Thursday this week although this seems to be moving back and forth. She is not systemically unwell. We are using silver alginate on her major wound area on the right medial foot and the draining areas on the right lateral lower leg 10/26; MRI showed extensive abscess in the anterior compartment of the right leg also widespread osteomyelitis involving osseous structures of the midfoot and portions of the hindfoot. Also suspicion for osteomyelitis anterior aspect of the distal medial malleolus. Culture I did of the purulence once again showed a multidrug-resistant Klebsiella. I have been  in contact with nephrology late last week and she has been started on  cefepime at dialysis to replace the vancomycin We sent a copy of her MRI report to Dr. Geroge Baseman in [place] who is an orthopedic surgeon. The patient takes great stock in his opinion on this. She says she will go to [place] to have her leg amputated if Dr. Geroge Baseman does not feel there is any salvage options. 11/2; she still is not talk to her orthopedic surgeon in [place]. Apparently he will call her at [phone] this afternoon. The quality of this is she has not allowed me to refer her anywhere. She has been told over and over that she needs this amputated but has not agreed to be referred. She tells me her blood sugar was 600 last night but she has not been febrile. 11/9; she never did got a call from the orthopedic surgeon in [place] therefore that is off the radar. We have arranged to get her see orthopedic surgery at [facility]. She still has a lot of draining purulence coming out of the new abscess in her right leg although that probably came from the osteomyelitis in her right foot and heel. Meanwhile the original wound on the right foot looks very healthy. Continued improvement. The issue is that the last MRI showed osteomyelitis in her right foot extensively she now has an abscess in the right anterior lower leg. There is nobody in [place] who will offer this woman anything but an amputation and to be honest that is probably what she needs. I think she still wants to talk about limb salvage although at this point I just do not see that. She has completed her vancomycin at dialysis which was for the original staph aureus she is still on cefepime for the more recent Klebsiella. She has had a long course of both of these antibiotics which should have benefited the osteomyelitis on the right foot as well as the abscess. 11/16; apparently [place] elective surgery is shut down because of COVID-19 pandemic. I have reached out to some contacts at [facility] to see if we can get her an  orthopedic appointment there. I am concerned about continually leaving this but for the moment everything is static. In fact her original large wound on this foot is closing down. It is the abscess on the right anterior leg that continues to drain purulent serosanguineous material. She is not currently on any antibiotics however she had a prolonged course of vancomycin [1 month] as well as cefepime for a month 02/24/2019 on evaluation today patient appears to be doing better than the last time I saw her. This is not a patient that I typically see. With that being said I am covering for Dr. Dellia Nims this week and again compared to when I last saw her overall the wounds in particular seem to be doing significantly better which is good news. With that being said the patient tells me several disconcerting things. She has not been able to get in to see anyone for potential debridement in regard to her leg wounds although she tells me that she does not think it is necessary any longer because she is taking care of that herself. She noticed a string coming out of the lower wound on her leg over the last week. The patient states that she subsequently decided that we must of pack something in there and started pulling the string out and as it kept coming and coming she realized this was likely her  tendon. With that being said she continued to remove as much of this as she could. She then I subsequently proceeded to using tubes of antibiotic ointment which she will stick down into the wound and then scored as much as she can until she sees it coming out of the other wound opening. She states that in doing this she is actually made things better and there is less redness and irritation. With regard to her foot wound she does have some necrotic tendon and tissue noted in one small corner but again the actual wound itself seems to be doing better with good granulation in general compared to my last evaluation. 12/7;  continued improvement in the wound on the substantial part of the right medial foot. Still a necrotic area inferiorly that required debridement but the rest of this looks very healthy and is contracting. She has 2 wounds on the right lateral leg which were her original drainage sites from her abscess but all of this looks a lot better as well. She has been using silver alginate after putting antibiotic biotic ointment in one wound and watching it come out the other. I have talked to her in some detail today. I had given her names of orthopedic surgeons at [facility] for second opinion on what to do about the right leg. I do not think the patient never called them. She has not been able to get a hold of the orthopedic surgeon in [place] that she had put a lot of faith in as being somebody would give her an opinion that she would trust. I talked to her today and said even if I could get her in to another orthopedic surgeon about the leg which she accept an amputation and she said she would not therefore I am not going to press this issue for the moment 12/14; continued improvement in his substantial wound on the right medial foot. There is still a necrotic area inferiorly with tightly adherent necrotic debris which I have been working on debriding each time she is here. She does not have an orthopedic appointment. Since last time she was here I looked over her cultures which were essentially MRSA on the foot wound and gram-negative rods in the abscess on the anterior leg. 12/21; continued improvement in the area on the right medial foot. She is not up on this much and that is probably a good thing since I do not know it could support continuous ambulation. She has a small area on the right lateral leg which were remanence of the IandD's I did because of the abscess. I think she should probably have prophylactic antibiotics I am going to have to look this over to see if we can make an intelligent  decision here. In the meantime her major wound is come down nicely. Necrotic area inferiorly is still there but looks a lot better 04/06/2019; she has had some improvement in the overall surface area on the right medial foot somewhat narrowedr both but somewhat longer. The areas on the right lateral leg which were initial IandD sites are superficial. Nothing is present on the right heel. We are using silver alginate to the wound areas 1/18; right medial foot somewhat smaller. Still a deep probing area in the most distal recess of the wound. She has nothing open on the right leg. She has a new wound on the plantar aspect of her left fourth toe which may have come from just pulling skin. The patient using Medihoney on  the wound on her foot under silver alginate. I cannot discourage her from this 2/1; 2-week follow-up using silver alginate on the right foot and her left fourth toe. The area on the right dorsal foot is contracted although there is still the deep area in the most distal part of the wound but still has some probing depth. No overt infection 2/15; 2-week follow-up. She continues to have improvement in the surface area on the dorsal right foot. Even the tunneling area from last time is almost closed. The area that was on the plantar part of her left fourth toe over the PIP is indeed closed 3/1; 2-week follow-up. Continued improvement in surface area. The original divot that we have been debriding inferiorly I think has full epithelialization although the epithelialization is gone down into the wound with probably 4 mm of depth. Even under intense illumination I am unable to see anything open here. The remanence of the wound in this area actually look quite healthy. We have been using silver alginate 3/15; 2-week follow-up. Unfortunately not as good today. She has a comma shaped wound on the dorsal foot however the upper part of this is larger. Under illumination debris on the surface She also  tells [place] that she was on her right leg 2 times in the last couple of weeks mostly to reach up for things above her head etc. She felt a sharp pain in the right leg which she thinks is somewhere from the ankle to the knee. The patient has neuropathy and is really uncertain. She cannot feel her foot so she does not think it was coming from there 3/29; 2-week follow-up. Her wound measures smaller. Surface of the wound appears reasonable. She is using silver alginate with underlying Medihoney. She has home health. X-rays I did of her tib-fib last time were negative although it did show arterial calcification 4/12; 2-week follow-up. Her wound measures smaller in length. Using manuka honey with silver alginate on top. She has home health. 4/26; 2-week follow-up. Her wound is smaller but still very adherent debris under illumination requiring debridement she has been using manuka honey with silver alginate. 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 Been using Hydrofera Blue. She is on suppressive Bactrim but apparently she has been taking it twice daily 6/7; I have not seen this wound and about 6 weeks. Since then she was up in [place]. By her own admission she was walking on the foot because she did not have a wheelchair. The wound is not nearly as healthy looking as it was the last time I saw this. We ordered different things for her but she only uses Medihoney and silver alginate. As far as I know she is on suppressive trimethoprim sulfamethoxazole. She does not admit to any fever or chills. Her CBGs apparently are at baseline however she is saying that she feels some discomfort on the lateral part of her ankle I looked over her last inflammatory markers from the summer 2020 at which time she had a deeply necrotic infected wound in this area. On 11/10/2018 her sedimentation rate was 56 and C-reactive  protein 9.9. This was 107 and 29 on 07/29/2018. 6/17; the patient had a necrotic wound the last time she was here on the right dorsal foot. After debridement I did a culture. This showed a very resistant ESBL Klebsiella as well as Enterococcus. Her  x-ray of the foot which was done because of warmth and some discomfort showed bone destruction within the carpal bones involving the navicular acute cuboid lateral middle cuneiforms but essentially unchanged from her prior study which was done on 10/29/2018. The findings were felt to represent chronic osteomyelitis. We did inflammatory markers on her. Her white count was 5.25 sedimentation rate 16 and C-reactive protein at 11.1. Notable for the fact that in August 2020 her CRP was 9.9 and sedimentation rate 56. I have looked at her x-rays. It is true that the bone destruction is very impressive however the patient came into this clinic for the wound on her right foot with pieces of bone literally falling out anteriorly with purulent material. I am not exactly sure I could have expected anything different. She has not been systemically unwell no fever chills or blood sugars have been reasonable. 6/28; she arrives with a right heel closed. The substantial area on the right anterior foot looks healthy. Much better looking surface. I think we can change to [facility] seems to help this previously. She is getting her antibiotics at dialysis she should be just about finished 7/9; changed to [facility] last week. Surface wound looks satisfactory not much change in surface area however. She is going to [place] state next week this is usually a difficult thing for this patient follow-up will be for 2 weeks. 7/23; using Hydrofera Blue. She returns from her trip and the wound looks surprisingly good. Usually when this patient goes on trips she comes back with a lot of problems with the wound. She is saying that she sometimes feels an episodic "crunching"  feeling on the lateral part of the foot. She is neuropathic and not feeling pain but wonders whether this could be a neuropathic dysesthesia. 11/13/19-Patient returns after 3 weeks, the wound itself is stable and patient states that there is nothing new going on she is on some extra anxiety medications and is resisting the temptation to pick at the dry skin around the wound. 9/20; patient has not been here in over a month and I have not seen her in 2 months. The wound in terms of size I think is about the same. There is no exposed bone. She has a nonviable surface on this. She is supposed to be using [facility] however she is also been using some form of honey preparation as well as a silver-based dressings. I do not think she has any pattern to this. 10/4; 2-week follow-up. Patient has been using some form of spray which she says has honey and silver to purchase this online she has been covering it with gauze. In spite of this the wound actually looks quite good. The deeper divot distally appears to be close down. There is a rim of epithelialization. Electronic Signature(s) Signed: 01/04/2020 4:54:01 PM By: Linton Ham MD Entered By: Linton Ham on 01/04/2020 14:08:07 -------------------------------------------------------------------------------- Physical Exam Details Patient Name: Date of Service: [name] 01/04/2020 12:30 PM Medical Record Number: [id number] Patient Account Number: [id number] Date of Birth/Sex: Treating RN: [birth year] (49 y.o. [name] Primary Care Provider: [name], [name] Other Clinician: Referring Provider: Treating Provider/Extender: [name], [name] Weeks in Treatment: 75 Constitutional Sitting or standing Blood Pressure is within target range for patient.. Pulse regular and within target range for patient.Marland Kitchen Respirations regular, non-labored and within target range.. Temperature is normal and within the target range  for the patient.Marland Kitchen Appears in no  distress. Cardiovascular Pedal pulses are palpable on the right. Notes Wound exam; right transmetatarsal amputation site with previous osteomyelitis. She probably has chronic osteomyelitis under this although her her foot is not warm and her vascular supply is good. No debridement is required. The divot in the distal part of this wound appears to be shot down Electronic Signature(s) Signed: 01/04/2020 4:54:01 PM By: Linton Ham MD Entered By: Linton Ham on 01/04/2020 14:09:05 -------------------------------------------------------------------------------- Physician Orders Details Patient Name: Date of Service: [name] 01/04/2020 12:30 PM Medical Record Number: [id number] Patient Account Number: [id number] Date of Birth/Sex: Treating RN: 03-25-1971 (49 y.o. [name] Primary Care Provider: [name], [name] Other Clinician: Referring Provider: Treating Provider/Extender: [name], [name] Weeks in Treatment: 35 Verbal / Phone Orders: No Diagnosis Coding ICD-10 Coding Code Description M86.671 Other chronic osteomyelitis, right ankle and foot L97.514 Non-pressure chronic ulcer of other part of right foot with necrosis of bone L97.411 Non-pressure chronic ulcer of right heel and midfoot limited to breakdown of skin E10.621 Type 1 diabetes mellitus with foot ulcer Follow-up Appointments Return Appointment in 2 weeks. Dressing Change Frequency Wound #43 Right,Medial Foot Change Dressing every other day. Wound Cleansing Wound #43 Right,Medial Foot Clean wound with Wound Cleanser - or normal saline Primary Wound Dressing Wound #43 Right,Medial Foot Hydrofera Blue Secondary Dressing Wound #43 Right,Medial Foot Kerlix/Rolled Gauze Dry Gauze Edema Control Elevate legs to the level of the heart or above for 30 minutes daily and/or when sitting, a frequency of: - throughout the day [facility] skilled [facility] for wound care. - Interim Electronic Signature(s) Signed: 01/04/2020 4:54:01 PM By: Linton Ham MD Signed: 01/04/2020 5:03:51 PM By: Baruch Gouty RN, BSN Entered By: Baruch Gouty on 01/04/2020 13:12:03 -------------------------------------------------------------------------------- Problem List Details Patient Name: Date of Service: [name] 01/04/2020 12:30 PM Medical Record Number: [id number] Patient Account Number: [id number] Date of Birth/Sex: Treating RN: [birth year] (49 y.o. [name], [name] Primary Care Provider: [name], [name] Other Clinician: Referring Provider: Treating Provider/Extender: [name], [name] Weeks in Treatment: 66 Active Problems ICD-10 Encounter Code Description Active Date MDM Diagnosis M86.671 Other chronic osteomyelitis, right ankle and foot 09/03/2018 No Yes L97.514 Non-pressure chronic ulcer of other part of right foot with necrosis of bone 09/03/2018 No Yes L97.411 Non-pressure chronic ulcer of right heel and midfoot limited to breakdown of 09/17/2019 No Yes skin E10.621 Type 1 diabetes mellitus with foot ulcer 09/24/2018 No Yes Inactive Problems ICD-10 Code Description Active Date Inactive Date L97.521 Non-pressure chronic ulcer of other part of left foot limited to breakdown of skin 04/20/2019 04/20/2019 L97.812 Non-pressure chronic ulcer of other part of right lower leg with fat layer exposed 02/24/2019 02/24/2019 Resolved Problems ICD-10 Code Description Active Date Resolved Date L02.415 Cutaneous abscess of right lower limb 12/25/2018 12/25/2018 Electronic Signature(s) Signed: 01/04/2020 4:54:01 PM By: Linton Ham MD Entered By: Linton Ham on 01/04/2020 14:07:05 -------------------------------------------------------------------------------- Progress Note Details Patient Name: Date of Service: [name] 01/04/2020 12:30 PM Medical Record Number:  [id number] Patient Account Number: [id number] Date of Birth/Sex: Treating RN: May 24, 1970 (49 y.o. [name] Primary Care Provider: [name], [name] Other Clinician: Referring Provider: Treating Provider/Extender: [name], [name] Weeks in Treatment: 9 Subjective History of Present Illness (HPI) 49 year old diabetic who is known to have type 1 diabetes which is poorly controlled last hemoglobin A1c was 11%.  She comes in with a ulcerated area on the left lateral foot which has been there for over 6 months. Was recently she has been treated by [name] of podiatry who saw her last on 05/28/2016. Review of his notes revealed that the patient had incision and drainage with placement of antibiotic beads to the left foot on 04/11/2016 for possible osteomyelitis of the cuboid bone. Over the last year she's had a history of amputation of the left fifth toe and a femoropopliteal popliteal bypass graft somewhere in April 2017. 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 His note [name] mentions that the patient has been referred to me for further wound care and possibly great candidate for hyperbaric oxygen therapy due to recurrent osteomyelitis. However we do not have any x-rays of biopsy reports confirming this. He has been on several antibiotics including Bactrim and most recently is on doxycycline for an MRSA. I understand, the patient was not a candidate for IV antibiotics as she has had previous PICC lines which resulted in blood clots in both arms. There was a x-ray report dated 04/04/2016 on [name] notes which showed evidence of fifth ray resection left foot with osteolytic changes noted to the fourth metatarsal and cuboid bone on the left. 06/13/2016 -- had a left foot x-ray which showed no acute fracture or dislocation and no definite radiographic evidence of osteomyelitis. Advanced osteopenia was seen. 06/20/2016 -- she has noticed a new wound on  the right plantar foot in the region where she had a callus before. 06/27/16- the patient did have her x-ray of the right foot which showed no findings to suggest osteomyelitis. She saw her endocrinologist, [name], yesterday. Her A1c in January was 11. He also indicates mismanagement and noncompliance regarding her diabetes. She is currently on Bactrim for a lip infection. She is complaining of nausea, vomiting and diarrhea. She is unable to articulate the exact orders or dosing of the Bactrim; it is unclear when she will complete this. 07/04/2016 -- results from [facility] of ABIs with ankle waveforms were noted from 02/14/2016. The examination done on 06/27/2015 showed noncompressible ABIs with the right being 1.45 and the left being 1.33. The present examination showed a right ABI of 1.19 on the left of 1.33. The conclusion was that right normal ABI in the lower extremity at rest however compared to previous study which was noncompressible ABI may be falsely elevated side suggesting medial calcification. The left ABI suggested medial calcification. 08/01/2016 -- the patient had more redness and pain on her right foot and did not get to come to see as noted she see her PCP or go to the ER and decided to take some leftover metronidazole which she had at home. As usual, the patient does report she feels and is rather noncompliant. 08/08/2016 -- -- x-ray of the right foot -- FINDINGS:Transmetatarsal amputation is noted. No bony destruction is noted to suggest osteomyelitis. IMPRESSION: No evidence of osteomyelitis. Postsurgical changes are seen. MRI would be more sensitive for possible bony changes. Culture has grown Serratia Marcescens -- sensitive to Bactrim, ciprofloxacin, ceftazidime she was seen by [name] on 08/06/2016. He did not find any exposed bone, muscle, tendon, ligament or joint. There was no malodor and he did a excisional debridement in the office. ============ Old  notes: 49 year old patient who is known to the wound clinic for a while had been away from the wound clinic since 09/01/2014. Over the last several months she has been  admitted to various hospitals including [place] [facility] [place]. She was treated for a right metatarsal osteomyelitis with a transmetatarsal amputation and this was done about 2 months ago. He has a small ulcerated area on the right heel and she continues to have an ulcerated area on the left plantar aspect of the foot. The patient was recently admitted to the [facility] hospital group between 7/12 and 10/18/2014. she was given 3 weeks of IV vancomycin and was to follow-up with her surgeons at [facility] and also took oral vancomycin for C. difficile colitis. 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 MRI of the left foot done on 10/14/2014 showed no abscess or osteomyelitis. 04/27/15; this is a patient we know from previous stays in the wound care center. She is a type I diabetic I am not sure of her control currently. Since the last time I saw her she is had a right transmetatarsal amputation and has no wounds on her right foot and has no open wounds. She is been followed at the wound care center at [facility] in [place]. She comes today with the desire to undergo hyperbaric treatment locally. Apparently one of her wound care providers in [place] has suggested hyperbarics. This is in response to an MRI from 04/18/15 that showed increased marrow signal and loss of the proximal fifth metatarsal cortex evidence of osteomyelitis with likely early osteomyelitis in the cuboid bone as well. She has a large wound over the base of the fifth metatarsal. She  also has a eschar over her the tips of her toes on 1,3 and 5. She does not have peripheral pulses and apparently is going for an angiogram tomorrow which seems reasonable. After this she is going to infectious disease at [facility]. They have been using Medihoney to the large wound on the lateral aspect of the left foot to. The patient has known Charcot deformity from diabetic neuropathy. She also has known diabetic PAD. Surprisingly I can't see that she has had any recent antibiotics, the patient states the last antibiotic she had was at the end of November for 10 days. I think this was in response to culture that showed group G strep although I'm not exactly sure where the culture was from. She is also had arterial studies on 03/29/15. This showed a right ABI of 1.4 that was noncompressible. Her left ABI was 0.73. 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 It was not felt that arterial inflow was adequate for healing of a foot ulcer. Her Doppler waveforms looked monophasic ===== READMISSION 02/28/17; this is in an now 49 year old woman we've had at several different occasions in this clinic. 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 She has a remote ex-smoker. She was last seen in this clinic by Dr. Con Memos I think in May. More recently she is been followed by her podiatrist [name] an infectious disease Dr. Megan Salon. She has 2 open wounds the major one is over the right first metatarsal head she also has a wound on the left plantar foot. an MRI of the right foot on 01/01/17 showed a soft tissue ulcer along the plantar aspect of the first metatarsal base consistent with osteomyelitis of the first metatarsal stump. Dr. 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 According to the patient this is been open for slightly over a month. She has been on a combination of  Cipro 500 twice a day, Zyvox 600 twice a day and Flagyl 500 3 times a day for over a month now as directed by Dr. Megan Salon. cultures of the right foot earlier this year showed MRSA in January and Serratia in May. January also had a few viridans strep. Recent x-rays of both feet were done and [name] office and I don't have these reports. The patient has known PAD and has a history of aleft femoropopliteal bypass in April 2017. 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 Dwyane Dee. In spite of this the last hemoglobin A1c I can see is 10.1 on 01/01/2017. She is being referred by 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 She is been using Medihoney to this area and also an area on the left midfoot. She is using healing sandals bilaterally. ABIs in this clinic at the left posterior tibial was 1.1 noncompressible on the right READMISSION Non invasive vascular NOVANT 5/18 Aftercare following surgery of the circulatory system Procedure Note - Interface, External Ris In - 08/13/2016 11:05 AM EDT Procedure: Examination consists of physiologic resting arterial pressures of the brachial and ankle arteries bilaterally with continuous wave Doppler waveform analysis. Previous: Previous exam performed on 02/14/16 demonstrated ABIs of Rt = 1.19 and Lt = 1.33. Right: ABI = non-compressible PT 1.47 DP. S/P transmet amputation. , Left: ABI = 1.52, 2nd digit pressure = 87 mmHg Conclusions: Right: ABI (>1.3) may be falsely elevated, suggesting medial calcification. Left: ABI (>1.3) may be falsely elevated, suggesting medial calcification The patient is a now 49 year old type I diabetic is had multiple issues her graded to chronic diabetic foot ulcers. She has had a  previous right transmetatarsal amputation fifth ray amputation. She had Charcot feet diabetic polyneuropathy. We had her in the clinic lastin November. At that point she had wounds on her bilateral feet.she had wanted to try hyperbarics however the healogics review process denied her because she hadn't followed up with her vascular surgeon for her left femoropopliteal bypass. The bypass was done by Dr. Raul Del at [facility]. We made her a follow-up with Dr. Raul Del however she did not keep the appointment and therefore she was not approved The patient shows me a small wound on her left fourth metatarsal head on her phone. She developed rapid discoloration in the plantar aspect of the left foot and she was admitted to hospital from 2/2 through 05/10/17 with wet gangrene of the left foot osteomyelitis of the fourth metatarsal heads. She was admitted acutely ill with a temperature of 103. She was started on broad-spectrum vancomycin and cefepime. On 05/06/17 she was taken to the OR by [name] her podiatric surgeon for an incision and drainage irrigation of the left foot wound. Cultures from this surgery revealed group be strep and anaerobes. she was seen by Dr.Xu of orthopedic surgery and scheduled for a below-knee amputation which she u refused. Ultimately she was discharged on Levaquin and Flagyl for one month. MRI 05/05/17 done while she was in the hospital showed abscess adjacent to the fourth metatarsal head and neck small  abscess around the fourth flexor tendon. Inflammatory phlegmon and gas in the soft tissues along the lateral aspect of the fourth phalanx. Findings worrisome for osteomyelitis involving the fourth proximal and middle phalanx and also the third and fourth metatarsals. Finally the patient had actually shortly before this followed up with Dr. Raul Del at no time on 04/29/17. He felt that her left femoropopliteal bypass was patent he felt that her left-sided toe pressures more than  adequate for healing a wound on the left foot. This was before her acute presentation. 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 05/28/17; she is started hyperbarics. The patient tells me that for some reason she was not actually on Levaquin but I think on ciprofloxacin. She was on Flagyl. She only started her Levaquin yesterday due to some difficulty with the pharmacy and perhaps her sister picking it up. She has an appointment with [name] tomorrow and with infectious disease early next week. She has no new complaints 06/06/17; the patient continues in hyperbarics. She saw [name] on 05/29/17 who is her podiatric surgeon. He is elected for a transmetatarsal amputation on 06/27/17. I'm not sure at what level he plans to do this amputation. The patient is unaware ooShe also saw Dr. Megan Salon of infectious disease who elected to continue her on current antibiotics I think this is ciprofloxacin and Flagyl. I'll need to clarify with her tomorrow if she actually has this. We're using silver alginate to the actual wound. Necrotic surface today with material under the flap of her foot. ooOriginal MRI showed abscesses as well as osteomyelitis of the proximal and middle fourth phalanx and the third and fourth metatarsal heads 06/11/17; patient continues in hyperbarics and continues on oral antibiotics. She is doing well. The wound looks better. The necrotic part of this under the flap in her superior foot also looks better. she is been to see [name]. I haven't had a chance to look at his note. Apparently he has put the transmetatarsal amputation on hold her request it is still planning to take her to the OR for debridement and product application ACEL. I'll see if I can find his note. I'll therefore leave product ordering/requests to [name] for now. I was going to look at Dermagraft 06/18/17-she is here in follow-up evaluation for bilateral foot wounds. She continues with hyperbaric therapy. She states  she has been applying manuka honey to the right plantar foot and alternate manuka honey and silver alginate to the left foot, despite our orders. We will continue with same treatment plan and she will follo up next week. 06/25/17; I have reviewed [name] last note from 3/11. She has operative debridement in 2 days' time. By review his note apparently they're going to place there is skin over the majority of this wound which is a good choice. She has a small satellite area at the most proximal part of this wound on the left plantar foot. The area on the right plantar foot we've been using silver alginate and it is close to healing. 07/02/17; unfortunately the patient was not easily approved for [name] proposed surgery. I'm not completely certain what the issue is. She has been using silver alginate to the wound she has completed a first course of hyperbarics. She is still on Levaquin and Flagyl. I have really lost track of the time course here.I suspect she should have another week to 2 of antibiotics. I'll need to see if she is followed up with infectious disease Dr. Megan Salon  07/09/17; the patient is followed up with Dr. Megan Salon. 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 She continues on Levaquin and metronidazole continuing both of these for now I think she is been on fr about 6 weeks. She still has some drainage but no pain. No fever. Her had been plans for her to go to the OR for operative debridement with her podiatrist [name], I am not exactly sure where that is. I'll probably slip a note to [name] today. I note that she follows with Dr. Dwyane Dee of endocrinology. We have her recertified for hyperbaric oxygen. I have not heard about Dermagraft however I'll see if [name] is planning a skin substitute as well 07/16/17; the patient tells me she is just about out of [place]. I'll need to check Dr. Hale Bogus last notes on this. She states she has  plenty of Flagyl however. She comes in today complaining of pain in the right lateral foot which she said lasted for about a day. The wound on the right foot is actually much more medially. She also tells me that the [facility] cost a lot of pain in the left foot wound and she turned back to silver alginate. Finally Dermagraft has a $732 per application co-pay. She cannot afford this 07/23/17; patient arrives today with the wound not much smaller. There is not much new to add. She has not heard from [name] all try to put in a call to them today. She was asking about Dermagraft again and she has an over $202 per application co-pay she states that she would be willing to try to do a payment plan. I been tried to avoid this. We've been using silver alginate, I'll change to [facility] 07/30/17-She is here in follow-up evaluation for left foot ulcer. She continues hyperbaric medicine. The left foot ulcer is stable we will continue with same treatment plan 08/06/17; she is here for evaluation of her left foot ulcer. Currently being treated for hyperbarics or underlying osteomyelitis. She is completed antibiotics. The left foot ulcer is better smaller with healthier looking granulation. For various reasons I am not really clear on we never got her back to the OR with [name]. He did not respond to my secure text message. Nevertheless I think that surgery on this point is not necessary nor am I completely clear that a skin substitute is necessary The patient is complaining about pain on the outside of her right foot. 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 There is no erythema. She also states the foot is warm versus her other part of her upper leg and this is largely true. It is not totally clear to me what's causing this. She thinks it's different from her usual neuropathy pain 08/13/17; she arrives in clinic today with a small wound which is superficial on her right first metatarsal  head. 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 She tells [place] she was up on her feet over the Mother's Day celebration. ooThe large wound is on the left foot. Continues with hyperbarics for underlying osteomyelitis. We're using Hydrofera Blue. She asked me today about where we were with Dermagraft. I had actually excluded this because of the co-pay however she wants to assume this therefore I'll recheck the co-pay an order for next week. 08/20/17; the patient agreed to accept the co-pay of the first Apligraf which we applied today. She is disappointed she is finishing  hyperbarics will run this through the insurance on the extent of the foot infection and the extent of the wound that she had however she is already had 60 dive's. Dermagraft No. 1 08/27/17; Dermagraft No. 2. She is not eligible for any more hyperbaric treatments this month. She reports a fair amount of drainage and she actually changed to the external dressings without disturbing the direct contact layer 09/03/17; the patient arrived in clinic today with the wound superficially looking quite healthy. Nice vibrant red tissue with some advancing epithelialization although not as much adherence of the flap as I might like. However she noted on her own fourth toe some bogginess and she brought that to our attention. Indeed this was boggy feeling like a possibility of subcutaneous fluid. 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 She is not been unwell. We've been using Dermagraft 09/10/17; the culture that I did not last week was MRSA. She saw Dr. Megan Salon this morning who is going to start her on vancomycin. I had sent him a secure a text message yesterday. I also spoke with her podiatric surgeon [name] about surgery on this foot the options for conserving a functional foot etc. Promised me he would see her and will make back consultation today.  Paradoxically her actual wound on the plantar aspect of her left foot looks really quite good. I had given her 5 days worth of Baxdella to cover her for MRSA. Her MRI came back showing osteomyelitis within the third metatarsal shaft and head and base of the third and fourth proximal phalanx. She had extensive inflammatory changes throughout the soft tissue of the lateral forefoot. With an ill-defined fluid around the fourth metatarsal extending into the plantar and dorsal soft tissues 09/19/17; the patient is actually on oral Septra and Flagyl. She apparently refused IV vancomycin. She also saw [name] at my request who is planning her for a left BKA sometime in mid July. MRI showed osteomyelitis within the third metatarsal shaft and head and the basis of the third and fourth proximal phalanx. I believe there was felt to be possible septic arthritis involving the third MTP. 09/26/17; the patient went back to Dr. Megan Salon at my suggestion and is now receiving IV daptomycin. Her wound continues to look quite good making the decision to proceed with a transmetatarsal amputation although more difficult for the patient. I believe in my extensive discussions with her she has a good sense of the pros and cons of this. I don't NV the tuft decision she has to make. She has an appointment with [name] I believe in mid July and I previously spoken to him about this issue Has we had used 3 previous Dermagraft. Given the condition of the wound surface I went ahead and added the fourth one today area and I did this not fully realizing that she'll be traveling to [place] next week. I'm hopeful she can come back in 2 weeks 10/21/17; Her same Dermagraft on for about 3-1/2 weeks. In spite of this the wound arrives looking quite healthy. There is been a lot of healing dimensions are smaller. Looking at the square shaped wound she has now there is some undermining and some depth medially under the undermining although  I cannot palpate any bone. No surrounding infection is obvious. She has difficult questions about how to look at this going forward vis--vis amputations versus continued medical therapy.  T be truthful the wound is looks so o healthy and it is continued to contract. Hard to justify foot surgery at this point although I still told her that I think it might come to that if we are not able to eradicate the underlying MRSA. She is still highly at risk and she understands this 11/06/17 on evaluation today patient appears to be doing better in regard to her foot ulcer. She's been tolerating the dressing changes without complication. Currently she is here for her Dermagraft #6. Her wound continues to make excellent progress at this point. She does not appear to have any evidence of infection which is good news. 11/13/17 on evaluation today patient appears to be doing excellent at this time. She is here for repeat Dermagraft application. This is #7. Overall her wound seems to be making great progress. 12/05/17; the patient arrives with the wound in much better condition than when I last saw this almost 6 weeks ago. She still has a small probing area in the left metatarsal head region on the lateral aspect of her foot. We applied her last Dermagraft today. ooSince the last time she is here she has what appears to a been a blood blister on the plantar aspect of left foot although I don't see this is threatening. There is also a thick raised tissue on the right mid metatarsal head region. This was not there I don't think the last time she was here 3 weeks ago. 12/12/17; the patient continues to have a small programming area in the left metatarsal head region on the lateral aspect of her foot which was the initial large surgical wound. I applied her last Apligraf last week. I'm going to use Endoform starting today ooUnfortunately she has an excoriated area in the left mid foot and the right mid foot. The left  midfoot looks like a blistered area this was not opened last week it certainly is open today. Using silver alginate on these areas. She promises me she is offloading this. 12/19/17; the small probing area in the left metatarsal head eyes think is shallower. In general her original wound looks better. We've been using Endoform. The area inferiorly that I think was trauma last week still requires debridement a lot of nonviable surface which I removed. She still has an open open area distally in her foot ooSimilarly on the right foot there is tightly adherent surface debris which I removed. Still areas that don't look completely epithelialized. This is a small open area. We used silver alginate on these areas 12/26/2017; the patient did not have the supplies we ordered from last week including the Endoform. The original large wound on the left lateral foot looks healthy. She still has the undermining area that is largely unchanged from last week. She has the same heavily callused raised edged wounds on the right mid and left midfoot. Both of these requiring debridement. We have been using silver alginate on these areas 01/02/2018; there is still supply issues. We are going to try to use Prisma but I am not sure she actually got it from what she is saying. She has a new open area on the lateral aspect of the left fourth toe [previous fifth ray amputation]. Still the one tunneling area over the fourth metatarsal head. The area is in the midfoot bilaterally still have thick callus around them. She is concerned about a raised swelling on the lateral aspect of the foot. However she is completely insensate 01/10/2018; we are using  Prisma to the wounds on her bilateral feet. Surprisingly the tunneling area over the left fourth metatarsal head that was part of her original surgery has closed down. She has a small open area remaining on the incision line. 2 open areas in the midfoot. 02/10/2018; the patient  arrives back in clinic after a month hiatus. She was traveling to visit family in [place]. Is fairly clear she was not offloading the areas on her feet. The original wound over the left lateral foot at the level of metatarsal heads is reopened and probes medially by about a centimeter or 2. She notes that a week ago she had purulent drainage come out of an area on the left midfoot. Paradoxically the worst area is actually on the right foot is extensive with purulent drainage. We will use silver alginate today 02/17/2018; the patient has 3 wounds one over the left lateral foot. She still has a small area over the metatarsal heads which is the remnant of her original surgical wound. This has medial probing depth of roughly 1.4 cm somewhat better than last week. The area on the right foot is larger. We have been using silver alginate to all areas. The area on the right foot and left foot that we cultured last week showed both Klebsiella and Proteus. Both of these are quinolone sensitive. The patient put her's self on Bactrim and Flagyl that she had left hanging around from prior antibiotic usages. She was apparently on this last week when she arrived. I did not realize this. Unfortunately the Bactrim will not cover either 1 of these organisms. We will send in Cipro 500 twice daily for a week 03/04/2018; the patient has 2 wounds on the left foot one is the original wound which was a surgical wound for a deep DFU. At one point this had exposed bone. She still has an area over the fourth metatarsal head that probes about 1.4 cm although I think this is better than last week. I been using silver nitrate to try and promote tissue adherence and been using silver alginate here. ooShe also has an area in the left midfoot. This has some depth but a small linear wound. Still requiring debridement. ooOn the right midfoot is a circular wound. A lot of thick callus around this area. ooWe have been using silver  alginate to all wound areas ooShe is completed the ciprofloxacin I gave her 2 weeks ago. 03/11/2018; the patient continues to have 2 open areas on the left foot 1 of which was the original surgical wound for a deep DFU. Only a small probing area remains although this is not much different from last week we have been using silver alginate. The other area is on the midfoot this is smaller linear but still with some depth. We have been using silver alginate here as well ooOn the right foot she has a small circular wound in the mid aspect. This is not much smaller than last time. We have been using silver alginate here as well 03/18/2018; she has 3 wounds on the left foot the original surgical wound, a very superficial wound in the mid aspect and then finally the area in the mid plantar foot. She arrives in today with a very concerning area in the wound in the mid plantar foot which is her most proximal wound. There is undermining here of roughly 1-1/2 cm superiorly. Serosanguineous drainage. She tells me she had some pain on for over the weekend that shot up her foot  into her thigh and she tells me that she had a nodule in the groin area. ooShe has the single wound in the right foot. ooWe are using endoform to both wound areas 03/24/2018; the patient arrives with the original surgical wound in the area on the left midfoot about the same as last week. There is a collection of fluid under the surface of the skin extending from the surgical wound towards the midfoot although it does not reach the midfoot wound. The area on the right foot is about the same. Cultures from last week of the left midfoot wound showed abundant Klebsiella abundant Enterococcus faecalis and moderate methicillin resistant staph I gave her Levaquin but this would have only covered the Klebsiella. She will need linezolid 04/01/2018; she is taking linezolid but for the first few days only took 1 a day. I have advised her to  finish this at twice daily dosing. In any case all of her wounds are a lot better especially on the left foot. The original surgical wound is closed. The area on the left midfoot considerably smaller. The area on the right foot also smaller. 04/08/2018; her original surgical wound/osteomyelitis on the left foot remains closed. She has area on the left foot that is in the midfoot area but she had some streaking towards this. This is not connected with her original wound at least not visually. ooSmall wound on the right midfoot appears somewhat smaller. 04/15/18; both wounds looks better. Original wound is better left midfoot. Using silver alginate 1/21; patient states she uses saltwater soak in, stones or remove callus from around her wounds. She is also concerned about a blood blister she had on the left foot but it simply resolved on its own. We've been using silver alginate 1/28; the patient arrives today with the same streaking area from her metatarsals laterally [the site of her original surgical wound] down to the middle of her foot. There is some drainage in the subcutaneous area here. This concerns me that there is actually continued ongoing infection in the metatarsals probably the fourth and third. This fixates an MRI of the foot without contrast [chronic renal failure] ooThe wound in the mid part of the foot is small but I wonder whether this area actually connects with the more distal foot. ooThe area on the right midfoot is probably about the same. Callus thick skin around the small wound which I removed with a curette we have been using silver alginate on both wound areas 2/4; culture I did of the draining site on the left foot last time grew methicillin sensitive staph aureus. MRI of the left foot showed interval resolution of the findings surrounding the third metatarsal joint on the prior study consistent with treated osteomyelitis. Chronic soft tissue ulceration in the plantar and  lateral aspect of the forefoot without residual focal fluid collection. No evidence of recurrent osteomyelitis. 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 She tells me she had nausea and vomiting over the weekend and missed 2 days. In general her foot looks much the same. She has a small open area just below the left fourth metatarsal head. A linear area in the left midfoot. Some discoloration extending from the inferior part of this into the left lateral  foot although this appears to be superficial. She has a small area on the right midfoot which generally looks smaller after debridement 2/18; the patient is completing his cephalexin and has another 2 days. She continues to have open areas on the left and right foot. 2/25; she is now off antibiotics. The area on the left foot at the site of her original surgical wound has closed yet again. She still has open areas in the mid part of her foot however these appear smaller. The area on the right mid foot looks about the same. We have been using silver alginate She tells me she had a serious hypoglycemic spell at home. She had to have EMS called and get IV dextrose 3/3; disappointing on the left lateral foot large area of necrotic tissue surrounding the linear area. This appears to track up towards the same original surgical wound. Required extensive debridement. The area on the right plantar foot is not a lot better also using silver 3/12; the culture I did last time showed abundant enterococcus. I have prescribed Augmentin, should cover any unrecognized anaerobes as well. In addition there were a few MRSA and Serratia that would not be well covered although I did not want to give her multiple antibiotics. She  comes in today with a new wound in the right midfoot this is not connected with the original wound over her MTP a lot of thick callus tissue around both wounds but once again she said she is not walking on these areas 3/17-Patient comes in for follow-up on the bilateral plantar wounds, the right midfoot and the left plantar wound. Both these are heavily callused surrounding the wounds. We are continuing to use silver alginate, she is compliant with offloading and states she uses a wheelchair fairly often at home 3/24; both wound areas have thick callus. However things actually look quite a bit better here for the majority of her left foot and the right foot. 3/31; patient continues to have thick callused somewhat irritated looking tissue around the wounds which individually are fairly superficial. There is no evidence of surrounding infection. We have been using silver alginate however I change that to [facility] today 4/17; patient returns to clinic after having a scare with Covid she tested negative in her primary doctor's office. She has been using Hydrofera Blue. She does not have an open area on the right foot. On the left foot she has a small open area with the mid area not completely viable. She showed me pictures of what looks like a hemorrhagic blister from several days ago but that seems to have healed over this was on the lateral left foot 4/21; patient comes in to clinic with both her wounds on her feet closed. However over the weekend she started having pain in her right foot and leg up into the thigh. She felt as though she was running a low-grade fever but did not take her temperature. She took a doxycycline that she had leftover and yesterday a single Septra and metronidazole. She thinks things feel somewhat better. 4/28; duplex ultrasound I ordered last week was negative for DVT or superficial thrombophlebitis. She is completed the doxycycline I gave her. States she is still  having a lot of pain in the right calf and right ankle which is no better than last week. She cannot sleep. She also states she has a temperature of up to 101, coughing and complaining of visual loss in her bilateral eyes. Apparently she was tested  for Covid 2 weeks ago at [facility] and that was negative. Readmission: 09/03/18 patient presents back for reevaluation after having been evaluated at the end of April regarding erythema and swelling of her right lower extremity. Subsequently she ended up going to the hospital on 07/29/18 and was admitted not to be discharged until 08/08/18. Unfortunately it was noted during the time that she was in the hospital that she did have methicillin-resistant Staphylococcus aureus as the infection noted at the site. It was also determined that she did have osteomyelitis which appears to be fairly significant. She was treated with vancomycin and in fact is still on IV vancomycin at dialysis currently. This is actually slated to continue until 09/12/18 at least which will be the completion of the six weeks of therapy. Nonetheless based on what I'm seeing at this point I'm not sure she will be anywhere near ready to discontinue antibiotics at that time. Since she was released from the hospital she was seen by [name] who is her podiatrist on 08/27/18. His note specifically states that he is recommended that the patient needs of one knee amputation on the right as she has a life- threatening situation that can lead quickly to sepsis. The patient advised she would like to try to save her leg to which [name] apparently told her that this was against all medical advice. She also want to discontinue the Wound VAC which had been initiated due to the fact that she wasn't pleased with how the wound was looking and subsequently she wanted to pursue applying Medihoney at that time. He stated that he did not believe that the right lower extremity was salvageable and that the patient  understood but would still like to attempt hyperbaric option therapy if it could be of any benefit. She was therefore referred back to [place] for further evaluation. He plans to see her back next week. Upon inspection today patient has a significant amount purulent drainage noted from the wound at this point. The bone in the distal portion of her foot also appears to be extremely necrotic and spongy. When I push down on the bone it bubbles and seeps purulent drainage from deeper in the end of the foot. I do not think that this is likely going to heal very well at all and less aggressive surgical debridement were undertaken more than what I believe we can likely do here in our office. 09/12/2018; I have not seen this patient since the most recent hospitalization although she was in our clinic last week. I have reviewed some of her records from a complex hospitalization. 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 There is situation was complicated by MRSA bacteremia and acute on chronic renal failure now on dialysis. She is receiving vancomycin at dialysis. We started her on Dakin's wet-to-dry last week she is changing this daily. There is still purulent drainage coming out of her foot. Although she is apparently "agreeable" to a below-knee amputation which is been suggested by multiple clinicians she wants this to be done in [place]. She apparently has a telehealth visit with that provider sometime in late [place] 6/24. I have told her I think this is probably too long. Nevertheless I could not convince her to allow a local doctor to perform BKA. 09/19/2018; the patient has a large necrotic area on the right anterior foot. She has had previous transmetatarsal amputations. Culture I did last week showed MRSA nothing else she  is on vancomycin at dialysis. She has continued leaking purulent drainage out of the distal part of the large circular wound on the  right anterior foot. She apparently went to see Dr. 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 Somehow that translated into her being referred to plastic surgery for debridement of the area. I gather she basically refused amputation although I do not have a copy of Dr. Berenice Primas notes. The patient really wants to have a trial of hyperbaric oxygen. I agreed with initial assessment in this clinic that this was probably too far along to benefit however if she is going to have plastic surgery I think she would benefit from ancillary hyperbaric oxygen. The issue here is that the patient has benefited as maximally as any patient I have ever seen from hyperbaric oxygen therapy. Most recently she had exposed bone on the lateral part of her left foot after a surgical procedure and that actually has closed. She has eschared areas in both heels but no open area. She is remained systemically well. I am not optimistic that anything can be done about this but the patient is very clear that she wants an attempt. The attempt would include a wound VAC further debridements and hyperbaric oxygen along with IV antibiotics. 6/26; I put her in for a trial of hyperbaric oxygen only because of the dramatic response she has had with wounds on her left midfoot earlier this year which was a surgical wound that went straight to her bone over the metatarsal heads and also remotely the left third toe. We will see if we can get this through our review process and insurance. She arrives in clinic with again purulent material pouring out of necrotic bone on the top of the foot distally. There is also some concerning erythema on the front of the leg that we marked. It is bit difficult to tell how tender this is because of neuropathy. I note from infectious disease that she had her vancomycin extended. 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 She had the wound VAC on for part  of the week. The rest of the time she is putting various things on this including Medihoney, "ionized water" silver sorb gel etc. 7/7; follow-up along with HBO. She is still on vancomycin at dialysis. She has a large open area on the dorsal right foot and a small dark eschar area on her heel. There is a lot less erythema in the area and a lot less tenderness. From an infection point of view I think this is better. She still has a lot of necrosis in the remaining right forefoot [previous TMA] we are still using the wound VAC in this area 7/16; follow-up along with HBO. I put her on linezolid after she finished her vancomycin. We started this last Friday I gave her 2 weeks worth. I had the expectation that she would be operatively debrided by Dr. Marla Roe but that still has not happened yet. Patient phoned the office this week. She arrives for review today after HBO. The distal part of this wound is completely necrotic. Nonviable pieces of tendon bone was still purulent drainage. Also concerning that she has black eschar over the heel that is expanding. I think this may be indicative of infection in this area as well. She has less erythema and warmth in the ankle and calf but still an abnormal exam 7/21 follow-up along with HBO. I will renew her linezolid after checking  a CBC with differential monitoring her blood counts especially her platelets. She was supposed to have surgery yesterday but if I am reading things correctly this was canceled after her blood sugar was found to be over 500. I thought Dr. Marla Roe who called me said that they were sending her to the ER but the patient states that was not the case. 7/28. Follow-up along with HBO. She is on linezolid I still do not have any lab work from dialysis even though I called last week. The patient is concerned about an area on her left lateral foot about the level of the base of her fifth metatarsal. I did not really see anything that ominous  here however this patient is in [place] ability to point out problems that she is sensing and she has been accurate in the past Finally she received a call from Dr. Marla Roe who is referring her to another orthopedic surgeon stating that she is too booked up to take her to the operating room now. Was still using a wound VAC on the foot 8/3 -Follow-up after HBO, she is got another week of linezolid, she is to call [facility] for an appointment, x-rays of both feet were reviewed, the left foot x-ray with third MTP joint osteo- Right foot x-ray widespread osteo-in the right midfoot Right ankle x-ray does not show any active evidence of infection 8/11-Patient is seen after HBO, the wounds on the right foot appear to be about the same, the heel wound had some necrotic base over tendon that was debrided with a curette 8/21; patient is seen after HBO. The patient's wound on her dorsal foot actually looks reasonably good and there is substantial amount of epithelialization however the open area distally still has a lot of necrotic debris partially bone. I cannot really get a good sense of just how deep this probes under the foot. She has been pressuring me this week to order medical maggots through a company in [place] for her. The problem I have is there is not a defined wound area here. On the positive side there is no purulence. She has been to see infectious disease she is still on Septra DS although I have not had a chance to review their notes 8/28; patient is seen in conjunction with HBO. The wounds on her foot continued to improve including the right dorsal foot substantially the, the distal part of this wound and the area on the right heel. We have been using a wound VAC over this chronically. She is still on trimethoprim as directed by infectious disease 9/4; patient is seen in conjunction with HBO. Right dorsal foot wound substantially anteriorly is better however she continues to have a deep wound  in the distal part of this that is not responding. We have been using silver collagen under border foam ooArea on the right plantar medial heel seems better. We have been using Hydrofera Blue 12/12/18 on evaluation today patient appears to be doing about the same with regard to her wound based on prior measurements. She does have some necrotic tissue noted on the lateral aspect of the wound that is going require a little bit of sharp debridement today. This includes what appears to be potentially either severely necrotic bone or tendon. Nonetheless other than that she does not appear to have any severe infection which is good news 9/18; it is been 2 weeks since I saw this wound. She is tolerating HBO well. Continued dramatic improvement in the area on the  right dorsal foot. She still has a small wound on the heel that we have been using Hydrofera Blue. She continues with a wound VAC 9/24; patient has to be seen emergently today with a swelling on her right lateral lower leg. She says that she told Dr. Evette Doffing about this and also myself on a couple of occasions but I really have no recollection of this. She is not systemically unwell and her wound really looked good the last time I saw this. She showed this to providers at dialysis and she was able to verify that she was started on cephalexin today for 5 doses at dialysis. She dialyzes on Tuesday Thursday and Saturday. 10/2; patient is seen in conjunction with HBO. The area that is draining on the right anterior medial tibia is more extensive. Copious amounts of serosanguineous drainage with some purulence. We are still using the wound VAC on the original wound then it is stable. Culture I did of the original IandD showed MRSA I contacted dialysis she is now on vancomycin with dialysis treatments. I asked them to run a month 10/9; patient seen in conjunction with HBO. She had a new spontaneous open area just above the wound on the right medial tibia  ankle. More swelling on the right medial tibia. Her wound on the foot looks about the same perhaps slightly better. There is no warmth spreading up her leg but no obvious erythema. her MRI of the foot and ankle and distal tib-fib is not booked for next Friday I discussed this with her in great detail over multiple days. it is likely she has spreading infection upper leg at least involving the distal 25% above the ankle. She knows that if I refer her to orthopedics for infectious disease they are going to recommend amputation and indeed I am not against this myself. We had a good trial at trying to heal the foot which is what she wanted along with antibiotics debridement and HBO however she clearly has spreading infection [probably staph aureus/MRSA]. Nevertheless she once again tells me she wants to wait the left of the MRI. She still makes comments about having her amputation done in [place]. 10/19; arrives today with significant swelling on the lateral right leg. Last culture I did showed Klebsiella. Multidrug-resistant. Cipro was intermediate sensitivity and that is what I have her on pending her MRI which apparently is going to be done on Thursday this week although this seems to be moving back and forth. She is not systemically unwell. We are using silver alginate on her major wound area on the right medial foot and the draining areas on the right lateral lower leg 10/26; MRI showed extensive abscess in the anterior compartment of the right leg also widespread osteomyelitis involving osseous structures of the midfoot and portions of the hindfoot. Also suspicion for osteomyelitis anterior aspect of the distal medial malleolus. Culture I did of the purulence once again showed a multidrug-resistant Klebsiella. I have been in contact with nephrology late last week and she has been started on cefepime at dialysis to replace the vancomycin We sent a copy of her MRI report to Dr. Geroge Baseman in  [place] who is an orthopedic surgeon. The patient takes great stock in his opinion on this. She says she will go to [place] to have her leg amputated if Dr. Geroge Baseman does not feel there is any salvage options. 11/2; she still is not talk to her orthopedic surgeon in [place]. Apparently he will call her at [phone] this  afternoon. The quality of this is she has not allowed me to refer her anywhere. She has been told over and over that she needs this amputated but has not agreed to be referred. She tells me her blood sugar was 600 last night but she has not been febrile. 11/9; she never did got a call from the orthopedic surgeon in [place] therefore that is off the radar. We have arranged to get her see orthopedic surgery at [facility]. She still has a lot of draining purulence coming out of the new abscess in her right leg although that probably came from the osteomyelitis in her right foot and heel. Meanwhile the original wound on the right foot looks very healthy. Continued improvement. The issue is that the last MRI showed osteomyelitis in her right foot extensively she now has an abscess in the right anterior lower leg. There is nobody in [place] who will offer this woman anything but an amputation and to be honest that is probably what she needs. I think she still wants to talk about limb salvage although at this point I just do not see that. She has completed her vancomycin at dialysis which was for the original staph aureus she is still on cefepime for the more recent Klebsiella. She has had a long course of both of these antibiotics which should have benefited the osteomyelitis on the right foot as well as the abscess. 11/16; apparently [place] elective surgery is shut down because of COVID-19 pandemic. I have reached out to some contacts at [facility] to see if we can get her an orthopedic appointment there. I am concerned about continually leaving this but for the moment  everything is static. In fact her original large wound on this foot is closing down. It is the abscess on the right anterior leg that continues to drain purulent serosanguineous material. She is not currently on any antibiotics however she had a prolonged course of vancomycin [1 month] as well as cefepime for a month 02/24/2019 on evaluation today patient appears to be doing better than the last time I saw her. This is not a patient that I typically see. With that being said I am covering for Dr. Dellia Nims this week and again compared to when I last saw her overall the wounds in particular seem to be doing significantly better which is good news. With that being said the patient tells me several disconcerting things. She has not been able to get in to see anyone for potential debridement in regard to her leg wounds although she tells me that she does not think it is necessary any longer because she is taking care of that herself. She noticed a string coming out of the lower wound on her leg over the last week. The patient states that she subsequently decided that we must of pack something in there and started pulling the string out and as it kept coming and coming she realized this was likely her tendon. With that being said she continued to remove as much of this as she could. She then I subsequently proceeded to using tubes of antibiotic ointment which she will stick down into the wound and then scored as much as she can until she sees it coming out of the other wound opening. She states that in doing this she is actually made things better and there is less redness and irritation. With regard to her foot wound she does have some necrotic tendon and tissue noted in one small corner  but again the actual wound itself seems to be doing better with good granulation in general compared to my last evaluation. 12/7; continued improvement in the wound on the substantial part of the right medial foot. Still a  necrotic area inferiorly that required debridement but the rest of this looks very healthy and is contracting. She has 2 wounds on the right lateral leg which were her original drainage sites from her abscess but all of this looks a lot better as well. She has been using silver alginate after putting antibiotic biotic ointment in one wound and watching it come out the other. I have talked to her in some detail today. I had given her names of orthopedic surgeons at [facility] for second opinion on what to do about the right leg. I do not think the patient never called them. She has not been able to get a hold of the orthopedic surgeon in [place] that she had put a lot of faith in as being somebody would give her an opinion that she would trust. I talked to her today and said even if I could get her in to another orthopedic surgeon about the leg which she accept an amputation and she said she would not therefore I am not going to press this issue for the moment 12/14; continued improvement in his substantial wound on the right medial foot. There is still a necrotic area inferiorly with tightly adherent necrotic debris which I have been working on debriding each time she is here. She does not have an orthopedic appointment. Since last time she was here I looked over her cultures which were essentially MRSA on the foot wound and gram-negative rods in the abscess on the anterior leg. 12/21; continued improvement in the area on the right medial foot. She is not up on this much and that is probably a good thing since I do not know it could support continuous ambulation. She has a small area on the right lateral leg which were remanence of the IandD's I did because of the abscess. I think she should probably have prophylactic antibiotics I am going to have to look this over to see if we can make an intelligent decision here. In the meantime her major wound is come down nicely. Necrotic area inferiorly is  still there but looks a lot better 04/06/2019; she has had some improvement in the overall surface area on the right medial foot somewhat narrowedr both but somewhat longer. The areas on the right lateral leg which were initial IandD sites are superficial. Nothing is present on the right heel. We are using silver alginate to the wound areas 1/18; right medial foot somewhat smaller. Still a deep probing area in the most distal recess of the wound. She has nothing open on the right leg. She has a new wound on the plantar aspect of her left fourth toe which may have come from just pulling skin. The patient using Medihoney on the wound on her foot under silver alginate. I cannot discourage her from this 2/1; 2-week follow-up using silver alginate on the right foot and her left fourth toe. The area on the right dorsal foot is contracted although there is still the deep area in the most distal part of the wound but still has some probing depth. No overt infection 2/15; 2-week follow-up. She continues to have improvement in the surface area on the dorsal right foot. Even the tunneling area from last time is almost closed. The area that  was on the plantar part of her left fourth toe over the PIP is indeed closed 3/1; 2-week follow-up. Continued improvement in surface area. The original divot that we have been debriding inferiorly I think has full epithelialization although the epithelialization is gone down into the wound with probably 4 mm of depth. Even under intense illumination I am unable to see anything open here. The remanence of the wound in this area actually look quite healthy. We have been using silver alginate 3/15; 2-week follow-up. Unfortunately not as good today. She has a comma shaped wound on the dorsal foot however the upper part of this is larger. Under illumination debris on the surface She also tells [place] that she was on her right leg 2 times in the last couple of weeks mostly to reach up for  things above her head etc. She felt a sharp pain in the right leg which she thinks is somewhere from the ankle to the knee. The patient has neuropathy and is really uncertain. She cannot feel her foot so she does not think it was coming from there 3/29; 2-week follow-up. Her wound measures smaller. Surface of the wound appears reasonable. She is using silver alginate with underlying Medihoney. She has home health. X-rays I did of her tib-fib last time were negative although it did show arterial calcification 4/12; 2-week follow-up. Her wound measures smaller in length. Using manuka honey with silver alginate on top. She has home health. 4/26; 2-week follow-up. Her wound is smaller but still very adherent debris under illumination requiring debridement she has been using manuka honey with silver alginate. 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 Been using Hydrofera Blue. She is on suppressive Bactrim but apparently she has been taking it twice daily 6/7; I have not seen this wound and about 6 weeks. Since then she was up in [place]. By her own admission she was walking on the foot because she did not have a wheelchair. The wound is not nearly as healthy looking as it was the last time I saw this. We ordered different things for her but she only uses Medihoney and silver alginate. As far as I know she is on suppressive trimethoprim sulfamethoxazole. She does not admit to any fever or chills. Her CBGs apparently are at baseline however she is saying that she feels some discomfort on the lateral part of her ankle I looked over her last inflammatory markers from the summer 2020 at which time she had a deeply necrotic infected wound in this area. On 11/10/2018 her sedimentation rate was 56 and C-reactive protein 9.9. This was 107 and 29 on 07/29/2018. 6/17; the patient had a necrotic wound the last  time she was here on the right dorsal foot. After debridement I did a culture. This showed a very resistant ESBL Klebsiella as well as Enterococcus. Her x-ray of the foot which was done because of warmth and some discomfort showed bone destruction within the carpal bones involving the navicular acute cuboid lateral middle cuneiforms but essentially unchanged from her prior study which was done on 10/29/2018. The findings were felt to represent chronic osteomyelitis. We did inflammatory markers on her. Her white count was 5.25 sedimentation rate 16 and C-reactive protein at 11.1. Notable for the fact that in August 2020 her CRP was 9.9 and sedimentation rate 56. I have looked at her x-rays. It is true  that the bone destruction is very impressive however the patient came into this clinic for the wound on her right foot with pieces of bone literally falling out anteriorly with purulent material. I am not exactly sure I could have expected anything different. She has not been systemically unwell no fever chills or blood sugars have been reasonable. 6/28; she arrives with a right heel closed. The substantial area on the right anterior foot looks healthy. Much better looking surface. I think we can change to [facility] seems to help this previously. She is getting her antibiotics at dialysis she should be just about finished 7/9; changed to [facility] last week. Surface wound looks satisfactory not much change in surface area however. She is going to [place] state next week this is usually a difficult thing for this patient follow-up will be for 2 weeks. 7/23; using Hydrofera Blue. She returns from her trip and the wound looks surprisingly good. Usually when this patient goes on trips she comes back with a lot of problems with the wound. She is saying that she sometimes feels an episodic "crunching" feeling on the lateral part of the foot. She is neuropathic and not feeling pain but wonders whether  this could be a neuropathic dysesthesia. 11/13/19-Patient returns after 3 weeks, the wound itself is stable and patient states that there is nothing new going on she is on some extra anxiety medications and is resisting the temptation to pick at the dry skin around the wound. 9/20; patient has not been here in over a month and I have not seen her in 2 months. The wound in terms of size I think is about the same. There is no exposed bone. She has a nonviable surface on this. She is supposed to be using [facility] however she is also been using some form of honey preparation as well as a silver-based dressings. I do not think she has any pattern to this. 10/4; 2-week follow-up. Patient has been using some form of spray which she says has honey and silver to purchase this online she has been covering it with gauze. In spite of this the wound actually looks quite good. The deeper divot distally appears to be close down. There is a rim of epithelialization. Objective Constitutional Sitting or standing Blood Pressure is within target range for patient.. Pulse regular and within target range for patient.Marland Kitchen Respirations regular, non-labored and within target range.. Temperature is normal and within the target range for the patient.Marland Kitchen Appears in no distress. Vitals Time Taken: 12:45 PM, Height: 67 in, Weight: 125 lbs, BMI: 19.6, Temperature: 98.4 F, Pulse: 73 bpm, Respiratory Rate: 18 breaths/min, Blood Pressure: 176/93 mmHg. Cardiovascular Pedal pulses are palpable on the right. 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 She probably has chronic osteomyelitis under this although her her foot is not warm and her vascular supply is good. No debridement is required. The divot in the distal part of this wound appears to be shot down Integumentary (Hair, Skin) Wound #43 status is Open. Original cause of wound was Gradually Appeared. The wound is located on  the Right,Medial Foot. The wound measures 4.8cm length x 2cm width x 0.1cm depth; 7.54cm^2 area and 0.754cm^3 volume. There is Fat Layer (Subcutaneous Tissue) exposed. There is no tunneling or undermining noted. There is a medium amount of serosanguineous drainage noted. The wound margin is flat and intact. There is medium (34-66%) pink granulation within the wound bed. There is a medium (34-66%)  amount of necrotic tissue within the wound bed including Adherent Slough. 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 Dressing Change Frequency: Wound #43 Right,Medial Foot: Change Dressing every other day. Wound Cleansing: Wound #43 Right,Medial Foot: Clean wound with Wound Cleanser - or normal saline Primary Wound Dressing: Wound #43 Right,Medial Foot: Hydrofera Blue Secondary Dressing: Wound #43 Right,Medial Foot: Kerlix/Rolled Gauze Dry Gauze Edema Control: Elevate legs to the level of the heart or above for 30 minutes daily and/or when sitting, a frequency of: - throughout the day Home Health: [facility] for wound care. - Interim 1. I have talked to her and I think to using Hydrofera Blue for 2 weeks. I would like to see if this can result in any further debridement of the wound surface and progress of the epithelialization. 2. I have no doubt that she has underlying chronic osteomyelitis. I have warned her that if she become systemically unwell with fever chills progressive erythema etc. she is likely to need an amputation. She is already aware of this Electronic Signature(s) Signed: 01/04/2020 4:54:01 PM By: Linton Ham MD Entered By: Linton Ham on  01/04/2020 14:11:01 -------------------------------------------------------------------------------- SuperBill Details Patient Name: Date of Service: [name] 01/04/2020 Medical Record Number: [id number] Patient Account Number: [id number] Date of Birth/Sex: Treating RN: [id number] (49 y.o. [name] Primary Care Provider: [name], [name] Other Clinician: Referring Provider: Treating Provider/Extender: [name], [name] Weeks in Treatment: 69 Diagnosis Coding ICD-10 Codes Code Description [phone] Other chronic osteomyelitis, right ankle and foot L97.514 Non-pressure chronic ulcer of other part of right foot with necrosis of bone L97.411 Non-pressure chronic ulcer of right heel and midfoot limited to breakdown of skin E10.621 Type 1 diabetes mellitus with foot ulcer Facility Procedures CPT4 Code: [id number] Description: 99213 - WOUND CARE VISIT-LEV 3 EST PT Modifier: Quantity: 1 Physician Procedures : CPT4 Code Description Modifier [id number] - WC PHYS LEVEL 3 - EST PT ICD-10 Diagnosis Description L97.514 Non-pressure chronic ulcer of other part of right foot with necrosis of bone M86.671 Other chronic osteomyelitis, right ankle and foot L97.411  Non-pressure chronic ulcer of right heel and midfoot limited to breakdown of skin Quantity: 1 Electronic Signature(s) Signed: 01/04/2020 4:54:01 PM By: Linton Ham MD Entered By: Linton Ham on 01/04/2020 14:11:24

## 2020-01-04 NOTE — Progress Notes (Signed)
Nancy, [name] ([id number]) Visit Report for 01/04/2020 Arrival Information Details Patient Name: Date of Service: [name]. 01/04/2020 12:30 PM Medical Record Number: [id number] Patient Account Number: [id number] Date of Birth/Sex: Treating RN: July 16, 1970 (49 y.o. [name] Primary Care Benecio Kluger: [name], [name] Other Clinician: Referring Zaria Taha: Treating Marcus Schwandt/Extender: [name], [name] Weeks in Treatment: 6 Visit Information History Since Last Visit All ordered tests and consults were completed: No Patient Arrived: Wheel Chair Added or deleted any medications: No Arrival Time: 12:44 Any new allergies or adverse reactions: No Accompanied By: self Had a fall or experienced change in No Transfer Assistance: None activities of daily living that may affect Patient Identification Verified: Yes risk of falls: Secondary Verification Process Completed: Yes Signs or symptoms of abuse/neglect since last visito No Patient Requires Transmission-Based Precautions: No Hospitalized since last visit: No Patient Has Alerts: No Implantable device outside of the clinic excluding No cellular tissue based products placed in the center since last visit: Has Dressing in Place as Prescribed: Yes Pain Present Now: No Electronic Signature(s) Signed: 01/04/2020 5:20:03 PM By: Carlene Coria RN Entered By: Carlene Coria on 01/04/2020 12:45:30 -------------------------------------------------------------------------------- Clinic Level of Care Assessment Details Patient Name: Date of Service: [name]. 01/04/2020 12:30 PM Medical Record Number: [id number] Patient Account Number: [id number] Date of Birth/Sex: Treating RN: May 06, 1970 (49 y.o. [name] Primary Care Valta Dillon: [name], [name] Other Clinician: Referring Danessa Mensch: Treating Leyanna Bittman/Extender: [name], [name] Weeks in Treatment: 43 Clinic Level of Care  Assessment Items TOOL 4 Quantity Score []  - 0 Use when only an EandM is performed on FOLLOW-UP visit ASSESSMENTS - Nursing Assessment / Reassessment X- 1 10 Reassessment of Co-morbidities (includes updates in patient status) X- 1 5 Reassessment of Adherence to Treatment Plan ASSESSMENTS - Wound and Skin A ssessment / Reassessment X - Simple Wound Assessment / Reassessment - one wound 1 5 []  - 0 Complex Wound Assessment / Reassessment - multiple wounds []  - 0 Dermatologic / Skin Assessment (not related to wound area) ASSESSMENTS - Focused Assessment X- 1 5 Circumferential Edema Measurements - multi extremities []  - 0 Nutritional Assessment / Counseling / Intervention X- 1 5 Lower Extremity Assessment (monofilament, tuning fork, pulses) []  - 0 Peripheral Arterial Disease Assessment (using hand held doppler) ASSESSMENTS - Ostomy and/or Continence Assessment and Care []  - 0 Incontinence Assessment and Management []  - 0 Ostomy Care Assessment and Management (repouching, etc.) PROCESS - Coordination of Care X - Simple Patient / Family Education for ongoing care 1 15 []  - 0 Complex (extensive) Patient / Family Education for ongoing care X- 1 10 Staff obtains Programmer, systems, Records, T Results / Process Orders est X- 1 10 Staff telephones HHA, Nursing Homes / Clarify orders / etc []  - 0 Routine Transfer to another Facility (non-emergent condition) []  - 0 Routine Hospital Admission (non-emergent condition) []  - 0 New Admissions / Biomedical engineer / Ordering NPWT Apligraf, etc. , []  - 0 Emergency Hospital Admission (emergent condition) X- 1 10 Simple Discharge Coordination []  - 0 Complex (extensive) Discharge Coordination PROCESS - Special Needs []  - 0 Pediatric / Minor Patient Management []  - 0 Isolation Patient Management []  - 0 Hearing / Language / Visual special needs []  - 0 Assessment of Community assistance (transportation, D/C planning, etc.) []  -  0 Additional assistance / Altered mentation []  - 0 Support Surface(s) Assessment (bed, cushion, seat, etc.) INTERVENTIONS - Wound  Cleansing / Measurement X - Simple Wound Cleansing - one wound 1 5 []  - 0 Complex Wound Cleansing - multiple wounds X- 1 5 Wound Imaging (photographs - any number of wounds) []  - 0 Wound Tracing (instead of photographs) X- 1 5 Simple Wound Measurement - one wound []  - 0 Complex Wound Measurement - multiple wounds INTERVENTIONS - Wound Dressings X - Small Wound Dressing one or multiple wounds 1 10 []  - 0 Medium Wound Dressing one or multiple wounds []  - 0 Large Wound Dressing one or multiple wounds X- 1 5 Application of Medications - topical []  - 0 Application of Medications - injection INTERVENTIONS - Miscellaneous []  - 0 External ear exam []  - 0 Specimen Collection (cultures, biopsies, blood, body fluids, etc.) []  - 0 Specimen(s) / Culture(s) sent or taken to Lab for analysis []  - 0 Patient Transfer (multiple staff / Civil Service fast streamer / Similar devices) []  - 0 Simple Staple / Suture removal (25 or less) []  - 0 Complex Staple / Suture removal (26 or more) []  - 0 Hypo / Hyperglycemic Management (close monitor of Blood Glucose) []  - 0 Ankle / Brachial Index (ABI) - do not check if billed separately X- 1 5 Vital Signs Has the patient been seen at the hospital within the last three years: Yes Total Score: 110 Level Of Care: New/Established - Level 3 Electronic Signature(s) Signed: 01/04/2020 5:03:51 PM By: Baruch Gouty RN, BSN Entered By: Baruch Gouty on 01/04/2020 13:08:24 -------------------------------------------------------------------------------- Encounter Discharge Information Details Patient Name: Date of Service: Nancy, [name]. 01/04/2020 12:30 PM Medical Record Number: [id number] Patient Account Number: [id number] Date of Birth/Sex: Treating RN: 1970-06-09 (49 y.o. [name] Primary Care Donnell Wion: [name],  [name] Other Clinician: Referring Elaiza Shoberg: Treating Naketa Daddario/Extender: [name], [name] Weeks in Treatment: 18 Encounter Discharge Information Items Discharge Condition: Stable Ambulatory Status: Wheelchair Discharge Destination: Home Transportation: Other Accompanied By: self Schedule Follow-up Appointment: Yes Clinical Summary of Care: Patient Declined Electronic Signature(s) Signed: 01/04/2020 5:08:20 PM By: [name] Entered By: [name] on 01/04/2020 13:19:07 -------------------------------------------------------------------------------- Lower Extremity Assessment Details Patient Name: Date of Service: [name]. 01/04/2020 12:30 PM Medical Record Number: [id number] Patient Account Number: [id number] Date of Birth/Sex: Treating RN: 1970/04/13 (49 y.o. [name] Primary Care Georgian Mcclory: [name], [name] Other Clinician: Referring Dajon Rowe: Treating Samika Vetsch/Extender: [name], [name] Weeks in Treatment: 69 Edema Assessment Assessed: [Left: No] [Right: No] Edema: [Left: N] [Right: o] Calf Left: Right: Point of Measurement: From Medial Instep 28 cm Ankle Left: Right: Point of Measurement: From Medial Instep 20 cm Electronic Signature(s) Signed: 01/04/2020 5:20:03 PM By: Carlene Coria RN Entered By: Carlene Coria on 01/04/2020 12:47:09 -------------------------------------------------------------------------------- Multi Wound Chart Details Patient Name: Date of Service: [name]. 01/04/2020 12:30 PM Medical Record Number: [id number] Patient Account Number: [id number] Date of Birth/Sex: Treating RN: 20-Mar-1971 (49 y.o. [name] Primary Care Shaddai Shapley: [name], [name] Other Clinician: Referring Shawneequa Baldridge: Treating Eason Housman/Extender: [name], [name] Weeks in Treatment: 23 Vital Signs Height(in): 1 Pulse(bpm): 8 Weight(lbs): 125 Blood Pressure(mmHg):  176/93 Body Mass Index(BMI): 20 Temperature(F): 98.4 Respiratory Rate(breaths/min): 18 Photos: [43:No Photos Right, Medial Foot] [N/A:N/A N/A] Wound Location: [43:Gradually Appeared] [N/A:N/A] Wounding Event: [43:Diabetic Wound/Ulcer of the Lower] [N/A:N/A] Primary Etiology: [43:Extremity Cataracts, Chronic sinus] [N/A:N/A] Comorbid History: [43:problems/congestion, Anemia, Sleep Apnea, Deep Vein Thrombosis, Hypertension, Peripheral Arterial Disease, Type I Diabetes, Osteoarthritis,  Osteomyelitis, Neuropathy, Seizure Disorder 08/04/2018] [N/A:N/A] Date Acquired: [43:69] [N/A:N/A] Weeks of Treatment: [43:Open] [N/A:N/A] Wound Status: [43:Yes] [N/A:N/A] Clustered Wound: [43:4.8x2x0.1] [N/A:N/A] Measurements [name] x W x D (cm) [43:7.54] [N/A:N/A] A (cm) : rea [43:0.754] [N/A:N/A] Volume (cm) : [43:89.60%] [N/A:N/A] % Reduction in A rea: [43:96.50%] [N/A:N/A] % Reduction in Volume: [43:Grade 4] [N/A:N/A] Classification: [43:Medium] [N/A:N/A] Exudate A mount: [43:Serosanguineous] [N/A:N/A] Exudate Type: [43:red, brown] [N/A:N/A] Exudate Color: [43:Flat and Intact] [N/A:N/A] Wound Margin: [43:Medium (34-66%)] [N/A:N/A] Granulation A mount: [43:Pink] [N/A:N/A] Granulation Quality: [43:Medium (34-66%)] [N/A:N/A] Necrotic A mount: [43:Fat Layer (Subcutaneous Tissue): Yes N/A] Exposed Structures: [43:Fascia: No Tendon: No Muscle: No Joint: No Bone: No Small (1-33%)] [N/A:N/A] Treatment Notes Wound #43 (Right, Medial Foot) 1. Cleanse With Wound Cleanser 3. Primary Dressing Applied Hydrofera Blue 4. Secondary Dressing Dry Gauze 6. Support Layer Education officer, community) Signed: 01/04/2020 4:54:01 PM By: Linton Ham MD Signed: 01/04/2020 5:03:51 PM By: Baruch Gouty RN, BSN Entered By: Linton Ham on 01/04/2020 14:07:18 -------------------------------------------------------------------------------- Multi-Disciplinary Care Plan Details Patient Name: Date of  Service: [address] [place] 01/04/2020 12:30 PM Medical Record Number: [id number] Patient Account Number: [id number] Date of Birth/Sex: Treating RN: 1971/03/25 (49 y.o. [name] Primary Care Jerrett Baldinger: [name], [name] Other Clinician: Referring Iva Montelongo: Treating Raidyn Wassink/Extender: [name], [name] Weeks in Treatment: 74 Active Inactive Wound/Skin Impairment Nursing Diagnoses: Impaired tissue integrity Knowledge deficit related to ulceration/compromised skin integrity Goals: Patient/caregiver will verbalize understanding of skin care regimen Date Initiated: 09/03/2018 Target Resolution Date: 01/22/2020 Goal Status: Active Ulcer/skin breakdown will have a volume reduction of 30% by week 4 Date Initiated: 09/03/2018 Date Inactivated: 10/07/2018 Target Resolution Date: 10/01/2018 Goal Status: Unmet Unmet Reason: COMORBITIES Ulcer/skin breakdown will have a volume reduction of 50% by week 8 Date Initiated: 10/07/2018 Date Inactivated: 11/03/2018 Target Resolution Date: 10/31/2018 Goal Status: Unmet Unmet Reason: Osteomyelitis Interventions: Assess patient/caregiver ability to obtain necessary supplies Assess patient/caregiver ability to perform ulcer/skin care regimen upon admission and as needed Assess ulceration(s) every visit Provide education on ulcer and skin care Treatment Activities: Skin care regimen initiated : 09/03/2018 Topical wound management initiated : 09/03/2018 Notes: Electronic Signature(s) Signed: 01/04/2020 5:03:51 PM By: Baruch Gouty RN, BSN Entered By: Baruch Gouty on 01/04/2020 13:06:14 -------------------------------------------------------------------------------- Pain Assessment Details Patient Name: Date of Service: [name]. 01/04/2020 12:30 PM Medical Record Number: [id number] Patient Account Number: [id number] Date of Birth/Sex: Treating RN: April 17, 1970 (49 y.o. [name] Primary Care Ruhani Umland: [name], [name] Other Clinician: Referring Zykeem Bauserman: Treating Donnamae Muilenburg/Extender: [name], [name] Weeks in Treatment: 38 Active Problems Location of Pain Severity and Description of Pain Patient Has Paino No Site Locations Pain Management and Medication Current Pain Management: Electronic Signature(s) Signed: 01/04/2020 5:20:03 PM By: Carlene Coria RN Entered By: Carlene Coria on 01/04/2020 12:46:09 -------------------------------------------------------------------------------- Patient/Caregiver Education Details Patient Name: Date of Service: [name]. 10/4/2021andnbsp12:30 PM Medical Record Number: [id number] Patient Account Number: [id number] Date of Birth/Gender: Treating RN: 1970/09/11 (49 y.o. [name] Primary Care Physician: [name], [name] Other Clinician: Referring Physician: Treating Physician/Extender: [name], [name] Weeks in Treatment: 79 Education Assessment Education Provided To: Patient Education Topics Provided Wound/Skin Impairment: Methods: Explain/Verbal Responses: Reinforcements needed, State content correctly Electronic Signature(s) Signed: 01/04/2020 5:03:51 PM By: Baruch Gouty RN, BSN Entered By: Baruch Gouty on 01/04/2020 13:07:26 -------------------------------------------------------------------------------- Wound Assessment Details Patient Name: Date of Service: [name]. 01/04/2020 12:30 PM Medical Record Number: [id number] Patient Account Number: [id number] Date of Birth/Sex: Treating RN: 1970/04/27 (49 y.o. [name] Primary Care Lason Eveland: [name], [name] Other Clinician: Referring Bartosz Luginbill: Treating Brelyn Woehl/Extender: [name], [name] Weeks in Treatment: 69 Wound Status Wound Number: 43 Primary Diabetic Wound/Ulcer of the Lower Extremity Etiology: Wound Location: Right, Medial Foot Wound Open Wounding Event: Gradually  Appeared Status: Date Acquired: 08/04/2018 Comorbid Cataracts, Chronic sinus problems/congestion, Anemia, Sleep Weeks Of Treatment: 69 History: Apnea, Deep Vein Thrombosis, Hypertension, Peripheral Arterial Clustered Wound: Yes Disease, Type I Diabetes, Osteoarthritis, Osteomyelitis, Neuropathy, Seizure Disorder Wound Measurements Length: (cm) 4.8 Width: (cm) 2 Depth: (cm) 0.1 Area: (cm) 7.54 Volume: (cm) 0.754 % Reduction in Area: 89.6% % Reduction in Volume: 96.5% Epithelialization: Small (1-33%) Tunneling: No Undermining: No Wound Description Classification: Grade 4 Wound Margin: Flat and Intact Exudate Amount: Medium Exudate Type: Serosanguineous Exudate Color: red, brown Foul Odor After Cleansing: No Slough/Fibrino Yes Wound Bed Granulation Amount: Medium (34-66%) Exposed Structure Granulation Quality: Pink Fascia Exposed: No Necrotic Amount: Medium (34-66%) Fat Layer (Subcutaneous Tissue) Exposed: Yes Necrotic Quality: Adherent Slough Tendon Exposed: No Muscle Exposed: No Joint Exposed: No Bone Exposed: No Treatment Notes Wound #43 (Right, Medial Foot) 1. Cleanse With Wound Cleanser 3. Primary Dressing Applied Hydrofera Blue 4. Secondary Dressing Dry Gauze 6. Support Layer Education officer, community) Signed: 01/04/2020 5:20:03 PM By: Carlene Coria RN Entered By: Carlene Coria on 01/04/2020 12:51:47 -------------------------------------------------------------------------------- Vitals Details Patient Name: Date of Service: [name]. 01/04/2020 12:30 PM Medical Record Number: [id number] Patient Account Number: [id number] Date of Birth/Sex: Treating RN: 1970-10-14 (48 y.o. [name] Primary Care Platon Arocho: [name], [name] Other Clinician: Referring Macey Wurtz: Treating Symphoni Helbling/Extender: [name], [name] Weeks in Treatment: 54 Vital Signs Time Taken: 12:45 Temperature (F): 98.4 Height (in): 67 Pulse  (bpm): 73 Weight (lbs): 125 Respiratory Rate (breaths/min): 18 Body Mass Index (BMI): 19.6 Blood Pressure (mmHg): 176/93 Reference Range: 80 - 120 mg / dl Electronic Signature(s) Signed: 01/04/2020 5:20:03 PM By: Carlene Coria RN Entered By: Carlene Coria on 01/04/2020 12:46:01

## 2020-01-15 ENCOUNTER — Other Ambulatory Visit: Payer: Self-pay

## 2020-01-15 ENCOUNTER — Ambulatory Visit (HOSPITAL_COMMUNITY)
Admission: RE | Admit: 2020-01-15 | Discharge: 2020-01-15 | Disposition: A | Discharge status: Home / Self Care | Payer: Medicare HMO | Source: Ambulatory Visit | Attending: Vascular Surgery | Admitting: Vascular Surgery

## 2020-01-15 ENCOUNTER — Encounter: Payer: Self-pay | Admitting: Vascular Surgery

## 2020-01-15 ENCOUNTER — Ambulatory Visit: Payer: Medicare HMO | Admitting: Vascular Surgery

## 2020-01-15 ENCOUNTER — Ambulatory Visit (INDEPENDENT_AMBULATORY_CARE_PROVIDER_SITE_OTHER)
Admission: RE | Admit: 2020-01-15 | Discharge: 2020-01-15 | Disposition: A | Discharge status: Home / Self Care | Payer: Medicare HMO | Source: Ambulatory Visit | Attending: Vascular Surgery | Admitting: Vascular Surgery

## 2020-01-15 VITALS — BP 186/89 | HR 76 | Temp 96.0°F | Resp 14 | Ht 67.0 in | Wt 125.7 lb

## 2020-01-15 DIAGNOSIS — Z992 Dependence on renal dialysis: Secondary | ICD-10-CM

## 2020-01-15 DIAGNOSIS — N186 End stage renal disease: Secondary | ICD-10-CM | POA: Insufficient documentation

## 2020-01-15 NOTE — Progress Notes (Signed)
Patient ID: [name], female   DOB: 06-13-1970, 49 y.o.   MRN: [id number]  Reason for Consult: New Patient (Initial Visit) (eval for AVG)   Referred by Dwana Melena, MD  Subjective:     HPI:  [name] is a 49 y.o. female history of Tuesday and since then.  She is now sent here for consideration of graft.  Patient would really like to not have a graft.  She currently dialyzing via catheter.  She is hopeful to have another procedure prior to graft.  Past Medical History:  Diagnosis Date  . Anemia   . Arthritis   . Bronchitis   . C. difficile diarrhea 09/26/2014  . Cataracts, both eyes    had surgery to remove  . Cellulitis of right foot 06/02/2014  . CKD (chronic kidney disease) stage 3, GFR 30-59 ml/min (HCC)    Dialysis T, TH, Sat  . Diabetic ulcer of right foot ([place]) 06/02/2014  . DVT (deep venous thrombosis) ([place]) 10/2014   one in each one in leg- PICC line , one in right and left arm  . H/O seasonal allergies   . Heart murmur    told once when she was pregnant- no furter mention- was in notes 11/2015- had Echo 8 /4/17  . History of blood transfusion   . Hypercholesteremia   . Hypertension   . Left foot infection   . Neuromuscular disorder ([place])    lower legs and feet  . Neuropathy    feet  . Neuropathy in diabetes ([place])   . Peripheral vascular disease ([place])   . Sleep apnea    does not use cpap  . Staphylococcus aureus bacteremia 10/2014  . Type 1 diabetes ([place])    onset age 52   Family History  Problem Relation Age of Onset  . Hyperlipidemia Mother   . Dementia Mother    Past Surgical History:  Procedure Laterality Date  . AMPUTATION TOE Left 07/24/2015   5th toe and Hallux amputation  . AV FISTULA PLACEMENT Left 08/06/2018   Procedure: INSERTION OF BASCILIC VEN TRANSPOSITION 1ST STAGE;  Surgeon: Rosetta Posner, MD;  Location: [facility];  Service: Vascular;  Laterality: Left;  . BASCILIC VEIN TRANSPOSITION Left 04/29/2019   Procedure: SECOND STAGE  BASCILIC VEIN TRANSPOSITION LEFT ARM;  Surgeon: Rosetta Posner, MD;  Location: [facility];  Service: Vascular;  Laterality: Left;  . CESAREAN SECTION     x 1  . EXOSTECTECTOMY TOE Left 04/11/2016   Procedure: EXOSTECTECTOMY CUBOID AND 5TH METATARSAL AND PLACEMENT OF ANTIBIOTIC BEADS;  Surgeon: Edrick Kins, DPM;  Location: [facility];  Service: Podiatry;  Laterality: Left;  . EYE SURGERY Bilateral    removed cataracts - laser  . FEMORAL-POPLITEAL BYPASS GRAFT Left 05/03/2015  . FOOT AMPUTATION THROUGH METATARSAL Right 2016  . hemrrhoidectomy    . I & D EXTREMITY Right 06/10/2014   Procedure: IRRIGATION AND DEBRIDEMENT Right Foot;  Surgeon: Newt Minion, MD;  Location: [facility];  Service: Orthopedics;  Laterality: Right;  . I & D EXTREMITY Right 07/31/2018   Procedure: IRRIGATION AND DEBRIDEMENT EXTREMITY;  Surgeon: Edrick Kins, DPM;  Location: [facility];  Service: Podiatry;  Laterality: Right;  . I & D EXTREMITY Right 08/04/2018   Procedure: IRRIGATION AND DEBRIDEMENT FOOT with placement of wound vac;  Surgeon: Edrick Kins, DPM;  Location: [facility];  Service: Podiatry;  Laterality: Right;  . INCISION AND DRAINAGE Left 04/11/2016   Procedure:  INCISION AND DRAINAGE A DEEP COMPLICATED WOUND OF LEFT FOOT;  Surgeon: Edrick Kins, DPM;  Location: [facility];  Service: Podiatry;  Laterality: Left;  . INSERTION OF DIALYSIS CATHETER Right 08/06/2018   Procedure: INSERTION OF TUNNELED DIALYSIS CATHETER;  Surgeon: Rosetta Posner, MD;  Location: [facility];  Service: Vascular;  Laterality: Right;  . IR FLUORO GUIDE CV LINE RIGHT  09/24/2017  . IR FLUORO GUIDE CV LINE RIGHT  08/01/2018  . IR REMOVAL TUN CV CATH W/O FL  11/22/2017  . IR US GUIDE VASC ACCESS RIGHT  09/24/2017  . IR US GUIDE VASC ACCESS RIGHT  08/01/2018  . IRRIGATION AND DEBRIDEMENT ABSCESS Left 05/06/2017   Procedure: IRRIGATION AND DEBRIDEMENT ABSCESS LEFT FOOT;  Surgeon: Edrick Kins, DPM;  Location: [facility];  Service: Podiatry;  Laterality: Left;  . PERIPHERALLY INSERTED  CENTRAL CATHETER INSERTION    . SKIN GRAFT Right 06/15/2014  . SKIN GRAFT Left 06/2015   foot   . SKIN SPLIT GRAFT Right 06/15/2014   Procedure: SPLIT THICKNESS SKIN GRAFT RIGHT FOOT;  Surgeon: Newt Minion, MD;  Location: [facility];  Service: Orthopedics;  Laterality: Right;  . TRANSMETATARSAL AMPUTATION Right   . TRANSMETATARSAL AMPUTATION Right 09/11/2014  . WISDOM TOOTH EXTRACTION      Short Social History:  Social History   Tobacco Use  . Smoking status: Former Smoker    Years: 15.00    Quit date: 05/16/2008    Years since quitting: 11.6  . Smokeless tobacco: Never Used  Substance Use Topics  . Alcohol use: Not Currently    Comment: Occasional    Allergies  Allergen Reactions  . Cleocin [Clindamycin Hcl] Diarrhea  . Lisinopril Other (See Comments)    Elevated potassium per pt report  . Amoxicillin Diarrhea, Nausea Only and Other (See Comments)    Did it involve swelling of the face/tongue/throat, SOB, or low BP? No Did it involve sudden or severe rash/hives, skin peeling, or any reaction on the inside of your mouth or nose? No Did you need to seek medical attention at a hospital or doctor's office? No When did it last happen?10 + years If all above answers are "NO", may proceed with cephalosporin use.    . Bactrim [Sulfamethoxazole-Trimethoprim] Diarrhea and Nausea Only    Current Outpatient Medications  Medication Sig Dispense Refill  . albuterol (PROVENTIL HFA;VENTOLIN HFA) 108 (90 BASE) MCG/ACT inhaler Inhale 2 puffs into the lungs every 6 (six) hours as needed for wheezing or shortness of breath.     Marland Kitchen aspirin EC 81 MG tablet Take 81 mg by mouth daily.    . B Complex-C-Zn-Folic Acid (DIALYVITE 102 WITH ZINC) 0.8 MG TABS Take 1 tablet by mouth daily.    . calcium acetate (PHOSLO) 667 MG capsule Take 2 capsules (1,334 mg total) by mouth 3 (three) times daily with meals. 180 capsule 0  . Continuous Blood Gluc Receiver ([place]) [place] 1 each by Does not  apply route See admin instructions. Use Dexcom Receiver to monitor blood sugar continuously. 1 each 0  . Continuous Blood Gluc Sensor (DEXCOM G6 SENSOR) MISC 1 each by Does not apply route See admin instructions. Use one sensors once every 10 days to monitor blood sugars. 9 each 3  . Continuous Blood Gluc Transmit (DEXCOM G6 TRANSMITTER) MISC 1 each by Does not apply route See admin instructions. Use one transmitter once every 90 days. 1 each 3  . CONTOUR NEXT TEST test strip USE AS  INSTRUCTED TO CHECK BLOOD SUGAR 4 TIMES A DAY. DX E10.65 100 strip 4  . Cyanocobalamin (B-12) 5000 MCG CAPS Take 5,000 mcg by mouth daily.    . diphenhydrAMINE (BENADRYL) 25 MG tablet Take 25 mg by mouth daily as needed.     . folic acid (FOLVITE) 161 MCG tablet Take 400 mcg by mouth daily.    . insulin aspart (NOVOLOG) 100 UNIT/ML injection USE A MAX OF 65 UNITS DAILY VIA INSULIN PUMP. 20 mL 2  . Insulin Human (INSULIN PUMP) SOLN Inject into the skin. Insulin aspart (Novolog) 100 unit/ml---MAX 65 UNITS DAILY    . norethindrone (AYGESTIN) 5 MG tablet Take 5 mg by mouth See admin instructions.     . Omega-3 Fatty Acids (OMEGA 3 500) 500 MG CAPS Take 500 mg by mouth daily.    . ondansetron (ZOFRAN) 4 MG tablet Take 4 mg by mouth every 8 (eight) hours as needed for nausea or vomiting.    . pravastatin (PRAVACHOL) 40 MG tablet TAKE 1 TABLET BY MOUTH EVERY DAY (Patient taking differently: Take 40 mg by mouth daily. ) 90 tablet 1  . sulfamethoxazole-trimethoprim (BACTRIM DS) 800-160 MG tablet Take 1 tablet by mouth daily.    . calcitRIOL (ROCALTROL) 0.5 MCG capsule Take 1 capsule daily (Patient not taking: Reported on 11/04/2019) 90 capsule 2   No current facility-administered medications for this visit.    Review of Systems  Constitutional:  Constitutional negative. HENT: HENT negative.  Eyes: Eyes negative.  Respiratory: Respiratory negative.  Cardiovascular: Cardiovascular negative.  GI: Gastrointestinal negative.    Musculoskeletal: Musculoskeletal negative.  Skin: Positive for wound.  Neurological: Neurological negative. Hematologic: Hematologic/lymphatic negative.  Psychiatric: Psychiatric negative.        Objective:  Objective   Vitals:   01/15/20 0925  BP: (!) 186/89  Pulse: 76  Resp: 14  Temp: (!) 96 F (35.6 C)  TempSrc: Temporal  SpO2: 98%  Weight: 125 lb 10.6 oz (57 kg)  Height: 5\' 7"  (1.702 m)   Body mass index is 19.68 kg/m.  Physical Exam Cardiovascular:     Rate and Rhythm: Normal rate.     Pulses:          Radial pulses are 1+ on the right side and 1+ on the left side.  Abdominal:     General: Abdomen is flat.  Skin:    General: Skin is warm.     Capillary Refill: Capillary refill takes less than 2 seconds.  Neurological:     General: No focal deficit present.     Mental Status: She is alert.  Psychiatric:        Mood and Affect: Mood normal.        Behavior: Behavior normal.        Thought Content: Thought content normal.        Judgment: Judgment normal.     Data: +-----------------+-------------+----------+--------+  Right Cephalic  Diameter (cm)Depth (cm)Findings  +-----------------+-------------+----------+--------+  Shoulder       0.27              +-----------------+-------------+----------+--------+  Prox upper arm    0.23              +-----------------+-------------+----------+--------+  Mid upper arm    0.25              +-----------------+-------------+----------+--------+  Dist upper arm    0.27              +-----------------+-------------+----------+--------+  Antecubital fossa  0.25              +-----------------+-------------+----------+--------+  Prox forearm     0.10              +-----------------+-------------+----------+--------+  Mid forearm     0.10               +-----------------+-------------+----------+--------+  Dist forearm     0.08              +-----------------+-------------+----------+--------+   +-----------------+-------------+----------+--------+  Right Basilic  Diameter (cm)Depth (cm)Findings  +-----------------+-------------+----------+--------+  Prox upper arm    0.29              +-----------------+-------------+----------+--------+  Mid upper arm    0.36              +-----------------+-------------+----------+--------+  Dist upper arm    0.34              +-----------------+-------------+----------+--------+  Antecubital fossa  0.27              +-----------------+-------------+----------+--------+  Prox forearm     0.19              +-----------------+-------------+----------+--------+   +-----------------+-------------+----------+----------------+  Left Cephalic  Diameter (cm)Depth (cm)  Findings    +-----------------+-------------+----------+----------------+  Prox upper arm    0.27                  +-----------------+-------------+----------+----------------+  Mid upper arm    0.23                  +-----------------+-------------+----------+----------------+  Dist upper arm    0.23                  +-----------------+-------------+----------+----------------+  Antecubital fossa  0.26                  +-----------------+-------------+----------+----------------+  Prox forearm     0.19        Chronic thrombus  +-----------------+-------------+----------+----------------+  Mid forearm               Chronic thrombus  +-----------------+-------------+----------+----------------+  Dist forearm     0.08        Chronic thrombus   +-----------------+-------------+----------+----------------+  Wrist        0.10        Chronic thrombus  +-----------------+-------------+----------+----------------+   +-----------------+-------------+----------+----------+  Left Basilic   Diameter (cm)Depth (cm) Findings   +-----------------+-------------+----------+----------+  Prox upper arm    0.45     0.37  161 cm/sec  +-----------------+-------------+----------+----------+  Mid upper arm    0.49     0.13         +-----------------+-------------+----------+----------+  Dist upper arm    0.42     0.13         +-----------------+-------------+----------+----------+  Antecubital fossa  0.55     0.10  410 cm/sec  +-----------------+-------------+----------+----------+      Assessment/Plan:     49 year old female here for evaluation of her dialysis access in the left upper extremity.  Patient would like to keep the basilic vein if at all possible.  I discussed with her the more likely need for graft in the left upper extremity.  She has requested 1 more fistulogram to possibly salvage this fistula we will get that scheduled on a nondialysis day.  More than likely from that procedure she is going to need consideration of conversion to left upper extremity graft if she has any central issues we would need to consider right upper extremity fistula versus graft.  She demonstrates good understanding we will set her up for a Wednesday in  the very near future.     Waynetta Sandy MD [facility]

## 2020-01-18 ENCOUNTER — Encounter (HOSPITAL_BASED_OUTPATIENT_CLINIC_OR_DEPARTMENT_OTHER): Payer: Medicare HMO | Admitting: Internal Medicine

## 2020-01-18 ENCOUNTER — Other Ambulatory Visit: Payer: Self-pay

## 2020-01-18 DIAGNOSIS — E1069 Type 1 diabetes mellitus with other specified complication: Secondary | ICD-10-CM | POA: Diagnosis not present

## 2020-01-19 NOTE — Progress Notes (Signed)
[name], [name] ([id number]) Visit Report for 01/18/2020 HPI Details Patient Name: Date of Service: [name] 01/18/2020 12:30 PM Medical Record Number: [id number] Patient Account Number: [id number] Date of Birth/Sex: Treating RN: 03/11/1971 (49 y.o. Nancy Fetter Primary Care Provider: [name], [name] Other Clinician: Referring Provider: Treating Provider/Extender: [name], [name] Weeks in Treatment: 33 History of Present Illness HPI Description: 49 year old diabetic who is known to have type 1 diabetes which is poorly controlled last hemoglobin A1c was 11%. She comes in with a ulcerated area on the left lateral foot which has been there for over 6 months. Was recently she has been treated by [name] of podiatry who saw her last on 05/28/2016. Review of his notes revealed that the patient had incision and drainage with placement of antibiotic beads to the left foot on 04/11/2016 for possible osteomyelitis of the cuboid bone. Over the last year she's had a history of amputation of the left fifth toe and a femoropopliteal popliteal bypass graft somewhere in April 2017. 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 His note [name] mentions that the patient has been referred to me for further wound care and possibly great candidate for hyperbaric oxygen therapy due to recurrent osteomyelitis. However we do not have any x-rays of biopsy reports confirming this. He has been on several antibiotics including Bactrim and most recently is on doxycycline for an MRSA. I understand, the patient was not a candidate for IV antibiotics as she has had previous PICC lines which resulted in blood clots in both arms. There was a x-ray report dated 04/04/2016 on [name] notes which showed evidence of fifth ray resection left foot with osteolytic changes noted to the fourth metatarsal and cuboid bone on the left. 06/13/2016 -- had a left foot x-ray which  showed no acute fracture or dislocation and no definite radiographic evidence of osteomyelitis. Advanced osteopenia was seen. 06/20/2016 -- she has noticed a new wound on the right plantar foot in the region where she had a callus before. 06/27/16- the patient did have her x-ray of the right foot which showed no findings to suggest osteomyelitis. She saw her endocrinologist, [name], yesterday. Her A1c in January was 11. He also indicates mismanagement and noncompliance regarding her diabetes. She is currently on Bactrim for a lip infection. She is complaining of nausea, vomiting and diarrhea. She is unable to articulate the exact orders or dosing of the Bactrim; it is unclear when she will complete this. 07/04/2016 -- results from [facility] of ABIs with ankle waveforms were noted from 02/14/2016. The examination done on 06/27/2015 showed noncompressible ABIs with the right being 1.45 and the left being 1.33. The present examination showed a right ABI of 1.19 on the left of 1.33. The conclusion was that right normal ABI in the lower extremity at rest however compared to previous study which was noncompressible ABI may be falsely elevated side suggesting medial calcification. The left ABI suggested medial calcification. 08/01/2016 -- the patient had more redness and pain on her right foot and did not get to come to see as noted she see her PCP or go to the ER and decided to take some leftover metronidazole which she had at home. As usual, the patient does report she feels and is rather noncompliant. 08/08/2016 -- -- x-ray of the right foot -- FINDINGS:Transmetatarsal amputation is noted. No bony destruction is noted to suggest osteomyelitis. IMPRESSION: No evidence of  osteomyelitis. Postsurgical changes are seen. MRI would be more sensitive for possible bony changes. Culture has grown Serratia Marcescens -- sensitive to Bactrim, ciprofloxacin, ceftazidime she was seen by [name] on  08/06/2016. He did not find any exposed bone, muscle, tendon, ligament or joint. There was no malodor and he did a excisional debridement in the office. ============ Old notes: 49 year old patient who is known to the wound clinic for a while had been away from the wound clinic since 09/01/2014. Over the last several months she has been admitted to various hospitals including [facility] [place]. She was treated for a right metatarsal osteomyelitis with a transmetatarsal amputation and this was done about 2 months ago. He has a small ulcerated area on the right heel and she continues to have an ulcerated area on the left plantar aspect of the foot. The patient was recently admitted to the [facility] between 7/12 and 10/18/2014. she was given 3 weeks of IV vancomycin and was to follow-up with her surgeons at [facility] and also took oral vancomycin for C. difficile colitis. 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 MRI of the left foot done on 10/14/2014 showed no abscess or osteomyelitis. 04/27/15; this is a patient we know from previous stays in the wound care center. She is a type I diabetic I am not sure of her control currently. Since the last time I saw her she is had a right transmetatarsal amputation and has no wounds on her right foot and has no open wounds. She is been followed at the wound care center at [facility] in [place]. She comes today with the desire to undergo hyperbaric treatment locally. Apparently one of her wound care providers in [place] has suggested hyperbarics. This is in response to an MRI from 04/18/15 that showed increased marrow signal and loss of the proximal  fifth metatarsal cortex evidence of osteomyelitis with likely early osteomyelitis in the cuboid bone as well. She has a large wound over the base of the fifth metatarsal. She also has a eschar over her the tips of her toes on 1,3 and 5. She does not have peripheral pulses and apparently is going for an angiogram tomorrow which seems reasonable. After this she is going to infectious disease at [facility]. They have been using Medihoney to the large wound on the lateral aspect of the left foot to. The patient has known Charcot deformity from diabetic neuropathy. She also has known diabetic PAD. Surprisingly I can't see that she has had any recent antibiotics, the patient states the last antibiotic she had was at the end of November for 10 days. I think this was in response to culture that showed group G strep although I'm not exactly sure where the culture was from. She is also had arterial studies on 03/29/15. This showed a right ABI of 1.4 that was noncompressible. Her left ABI was 0.73. 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 It was not felt that arterial inflow was adequate for healing of a foot ulcer. Her Doppler waveforms looked monophasic ===== READMISSION 02/28/17; this is in an now 49 year old woman we've had at several different occasions in this clinic. 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 She has  a remote ex-smoker. She was last seen in this clinic by Dr. Con Memos I think in May. More recently she is been followed by her podiatrist [name] an infectious disease Dr. Megan Salon. She has 2 open wounds the major one is over the right first metatarsal head she also has a wound on the left plantar foot. an MRI of the right foot on 01/01/17 showed a soft tissue ulcer along the plantar aspect of the first metatarsal base consistent with osteomyelitis of the first metatarsal stump. Dr. 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 According to the patient this is been open for slightly over a month. She has been on a combination of Cipro 500 twice a day, Zyvox 600 twice a day and Flagyl 500 3 times a day for over a month now as directed by Dr. Megan Salon. cultures of the right foot earlier this year showed MRSA in January and Serratia in May. January also had a few viridans strep. Recent x-rays of both feet were done and [name] office and I don't have these reports. The patient has known PAD and has a history of aleft femoropopliteal bypass in April 2017. 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 Dwyane Dee. In spite of this the last hemoglobin A1c I can see is 10.1 on 01/01/2017. She is being referred by 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 She is been using Medihoney to this area and also an area on the left midfoot. She is using healing sandals bilaterally. ABIs in this clinic at the left posterior tibial was 1.1 noncompressible on the right READMISSION Non invasive vascular NOVANT 5/18 Aftercare following surgery of the circulatory system Procedure Note - Interface, External Ris In - 08/13/2016 11:05 AM EDT Procedure: Examination consists of physiologic resting arterial pressures of the brachial and ankle arteries bilaterally with continuous wave Doppler waveform analysis. Previous: Previous exam performed on 02/14/16 demonstrated ABIs of Rt = 1.19 and Lt = 1.33. Right: ABI = non-compressible PT 1.47 DP. S/P transmet amputation. , Left: ABI = 1.52, 2nd digit pressure = 87 mmHg Conclusions: Right: ABI (>1.3) may be falsely elevated, suggesting medial calcification. Left: ABI (>1.3) may be falsely  elevated, suggesting medial calcification The patient is a now 49 year old type I diabetic is had multiple issues her graded to chronic diabetic foot ulcers. 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 We had her in the clinic lastin November. At that point she had wounds on her bilateral feet.she had wanted to try hyperbarics however the healogics review process denied her because she hadn't followed up with her vascular surgeon for her left femoropopliteal bypass. The bypass was done by Dr. Raul Del at [facility]. We made her a follow-up with Dr. Raul Del however she did not keep the appointment and therefore she was not approved The patient shows me a small wound on her left fourth metatarsal head on her phone. She developed rapid discoloration in the plantar aspect of the left foot and she was admitted to hospital from 2/2 through 05/10/17 with wet gangrene of the left foot osteomyelitis of the fourth metatarsal heads. She was admitted acutely ill with a temperature of 103.  She was started on broad-spectrum vancomycin and cefepime. On 05/06/17 she was taken to the OR by [name] her podiatric surgeon for an incision and drainage irrigation of the left foot wound. Cultures from this surgery revealed group be strep and anaerobes. she was seen by Dr.Xu of orthopedic surgery and scheduled for a below-knee amputation which she u refused. Ultimately she was discharged on Levaquin and Flagyl for one month. MRI 05/05/17 done while she was in the hospital showed abscess adjacent to the fourth metatarsal head and neck small abscess around the fourth flexor tendon. Inflammatory phlegmon and gas in the soft tissues along the lateral aspect of the fourth phalanx. Findings worrisome for osteomyelitis involving the fourth proximal and middle phalanx and also the third and fourth metatarsals. Finally the patient had actually shortly  before this followed up with Dr. Raul Del at no time on 04/29/17. He felt that her left femoropopliteal bypass was patent he felt that her left-sided toe pressures more than adequate for healing a wound on the left foot. This was before her acute presentation. 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 05/28/17; she is started hyperbarics. The patient tells me that for some reason she was not actually on Levaquin but I think on ciprofloxacin. She was on Flagyl. She only started her Levaquin yesterday due to some difficulty with the pharmacy and perhaps her sister picking it up. She has an appointment with [name] tomorrow and with infectious disease early next week. She has no new complaints 06/06/17; the patient continues in hyperbarics. She saw [name] on 05/29/17 who is her podiatric surgeon. He is elected for a transmetatarsal amputation on 06/27/17. I'm not sure at what level he plans to do this amputation. The patient is unaware She also saw Dr. Megan Salon of infectious disease who elected to continue her on current antibiotics I think this is ciprofloxacin and Flagyl. I'll need to clarify with her tomorrow if she actually has this. We're using silver alginate to the actual wound. Necrotic surface today with material under the flap of her foot. Original MRI showed abscesses as well as osteomyelitis of the proximal and middle fourth phalanx and the third and fourth metatarsal heads 06/11/17; patient continues in hyperbarics and continues on oral antibiotics. She is doing well. The wound looks better. The necrotic part of this under the flap in her superior foot also looks better. she is been to see [name]. I haven't had a chance to look at his note. Apparently he has put the transmetatarsal amputation on hold her request it is still planning to take her to the OR for debridement and product application ACEL. I'll see if I can find his note. I'll therefore leave product ordering/requests to [name]  for now. I was going to look at Dermagraft 06/18/17-she is here in follow-up evaluation for bilateral foot wounds. She continues with hyperbaric therapy. She states she has been applying manuka honey to the right plantar foot and alternate manuka honey and silver alginate to the left foot, despite our orders. We will continue with same treatment plan and she will follo up next week. 06/25/17; I have reviewed [name] last note from 3/11. She has operative debridement in 2 days' time. By review his note apparently they're going to place there is skin over the majority of this wound which is a good choice. She has a small satellite area at the most proximal part of this wound on the left plantar foot. The area on the right  plantar foot we've been using silver alginate and it is close to healing. 07/02/17; unfortunately the patient was not easily approved for [name] proposed surgery. I'm not completely certain what the issue is. She has been using silver alginate to the wound she has completed a first course of hyperbarics. She is still on Levaquin and Flagyl. I have really lost track of the time course here.I suspect she should have another week to 2 of antibiotics. I'll need to see if she is followed up with infectious disease Dr. Megan Salon 07/09/17; the patient is followed up with Dr. Megan Salon. 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 She continues on Levaquin and metronidazole continuing both of these for now I think she is been on fr about 6 weeks. She still has some drainage but no pain. No fever. Her had been plans for her to go to the OR for operative debridement with her podiatrist [name], I am not exactly sure where that is. I'll probably slip a note to [name] today. I note that she follows with Dr. Dwyane Dee of endocrinology. We have her recertified for hyperbaric oxygen. I have not heard about Dermagraft however I'll see if [name] is planning a  skin substitute as well 07/16/17; the patient tells me she is just about out of [place]. I'll need to check Dr. Hale Bogus last notes on this. She states she has plenty of Flagyl however. She comes in today complaining of pain in the right lateral foot which she said lasted for about a day. The wound on the right foot is actually much more medially. She also tells me that the [facility] cost a lot of pain in the left foot wound and she turned back to silver alginate. Finally Dermagraft has a $481 per application co-pay. She cannot afford this 07/23/17; patient arrives today with the wound not much smaller. There is not much new to add. She has not heard from [name] all try to put in a call to them today. She was asking about Dermagraft again and she has an over $856 per application co-pay she states that she would be willing to try to do a payment plan. I been tried to avoid this. We've been using silver alginate, I'll change to [facility] 07/30/17-She is here in follow-up evaluation for left foot ulcer. She continues hyperbaric medicine. The left foot ulcer is stable we will continue with same treatment plan 08/06/17; she is here for evaluation of her left foot ulcer. Currently being treated for hyperbarics or underlying osteomyelitis. She is completed antibiotics. The left foot ulcer is better smaller with healthier looking granulation. For various reasons I am not really clear on we never got her back to the OR with [name]. He did not respond to my secure text message. Nevertheless I think that surgery on this point is not necessary nor am I completely clear that a skin substitute is necessary The patient is complaining about pain on the outside of her right foot. 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 There is no erythema. She also states the foot is warm versus her other part of her upper leg and this is largely true. It is not totally clear to me what's causing this. She  thinks it's different from her usual neuropathy pain 08/13/17; she arrives in clinic today with a small wound which is superficial on her right first metatarsal head. 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 She tells [place] she was up on her feet over the Mother's Day celebration. The large wound is on the left foot. Continues with hyperbarics for underlying osteomyelitis. We're using Hydrofera Blue. She asked me today about where we were with Dermagraft. I had actually excluded this because of the co-pay however she wants to assume this therefore I'll recheck the co-pay an order for next week. 08/20/17; the patient agreed to accept the co-pay of the first Apligraf which we applied today. She is disappointed she is finishing hyperbarics will run this through the insurance on the extent of the foot infection and the extent of the wound that she had however she is already had 60 dive's. Dermagraft No. 1 08/27/17; Dermagraft No. 2. She is not eligible for any more hyperbaric treatments this month. She reports a fair amount of drainage and she actually changed to the external dressings without disturbing the direct contact layer 09/03/17; the patient arrived in clinic today with the wound superficially looking quite healthy. Nice vibrant red tissue with some advancing epithelialization although not as much adherence of the flap as I might like. However she noted on her own fourth toe some bogginess and she brought that to our attention. Indeed this was boggy feeling like a possibility of subcutaneous fluid. 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 She is not been unwell. We've been using Dermagraft 09/10/17; the culture that I did not last week was MRSA. She saw Dr. Megan Salon this morning who is going to start her on vancomycin. I had sent him a secure a text message yesterday. I also spoke with her podiatric surgeon  [name] about surgery on this foot the options for conserving a functional foot etc. Promised me he would see her and will make back consultation today. Paradoxically her actual wound on the plantar aspect of her left foot looks really quite good. I had given her 5 days worth of Baxdella to cover her for MRSA. Her MRI came back showing osteomyelitis within the third metatarsal shaft and head and base of the third and fourth proximal phalanx. She had extensive inflammatory changes throughout the soft tissue of the lateral forefoot. With an ill-defined fluid around the fourth metatarsal extending into the plantar and dorsal soft tissues 09/19/17; the patient is actually on oral Septra and Flagyl. She apparently refused IV vancomycin. She also saw [name] at my request who is planning her for a left BKA sometime in mid July. MRI showed osteomyelitis within the third metatarsal shaft and head and the basis of the third and fourth proximal phalanx. I believe there was felt to be possible septic arthritis involving the third MTP. 09/26/17; the patient went back to Dr. Megan Salon at my suggestion and is now receiving IV daptomycin. Her wound continues to look quite good making the decision to proceed with a transmetatarsal amputation although more difficult for the patient. I believe in my extensive discussions with her she has a good sense of the pros and cons of this. I don't NV the tuft decision she has to make. She has an appointment with [name] I believe in mid July and I previously spoken to him about this issue Has we had used 3 previous Dermagraft. Given the condition of the wound surface I went ahead and added the fourth one today area and I did this not fully realizing that she'll be traveling  to [place] next week. I'm hopeful she can come back in 2 weeks 10/21/17; Her same Dermagraft on for about 3-1/2 weeks. In spite of this the wound arrives looking quite healthy. There is been a lot of  healing dimensions are smaller. Looking at the square shaped wound she has now there is some undermining and some depth medially under the undermining although I cannot palpate any bone. No surrounding infection is obvious. She has difficult questions about how to look at this going forward vis--vis amputations versus continued medical therapy. T be truthful the wound is looks o so healthy and it is continued to contract. Hard to justify foot surgery at this point although I still told her that I think it might come to that if we are not able to eradicate the underlying MRSA. She is still highly at risk and she understands this 11/06/17 on evaluation today patient appears to be doing better in regard to her foot ulcer. She's been tolerating the dressing changes without complication. Currently she is here for her Dermagraft #6. Her wound continues to make excellent progress at this point. She does not appear to have any evidence of infection which is good news. 11/13/17 on evaluation today patient appears to be doing excellent at this time. She is here for repeat Dermagraft application. This is #7. Overall her wound seems to be making great progress. 12/05/17; the patient arrives with the wound in much better condition than when I last saw this almost 6 weeks ago. She still has a small probing area in the left metatarsal head region on the lateral aspect of her foot. We applied her last Dermagraft today. Since the last time she is here she has what appears to a been a blood blister on the plantar aspect of left foot although I don't see this is threatening. There is also a thick raised tissue on the right mid metatarsal head region. This was not there I don't think the last time she was here 3 weeks ago. 12/12/17; the patient continues to have a small programming area in the left metatarsal head region on the lateral aspect of her foot which was the initial large surgical wound. I applied her last Apligraf  last week. I'm going to use Endoform starting today Unfortunately she has an excoriated area in the left mid foot and the right mid foot. The left midfoot looks like a blistered area this was not opened last week it certainly is open today. Using silver alginate on these areas. She promises me she is offloading this. 12/19/17; the small probing area in the left metatarsal head eyes think is shallower. In general her original wound looks better. We've been using Endoform. The area inferiorly that I think was trauma last week still requires debridement a lot of nonviable surface which I removed. She still has an open open area distally in her foot Similarly on the right foot there is tightly adherent surface debris which I removed. Still areas that don't look completely epithelialized. This is a small open area. We used silver alginate on these areas 12/26/2017; the patient did not have the supplies we ordered from last week including the Endoform. The original large wound on the left lateral foot looks healthy. She still has the undermining area that is largely unchanged from last week. She has the same heavily callused raised edged wounds on the right mid and left midfoot. Both of these requiring debridement. We have been using silver alginate on these areas 01/02/2018;  there is still supply issues. We are going to try to use Prisma but I am not sure she actually got it from what she is saying. She has a new open area on the lateral aspect of the left fourth toe [previous fifth ray amputation]. Still the one tunneling area over the fourth metatarsal head. The area is in the midfoot bilaterally still have thick callus around them. She is concerned about a raised swelling on the lateral aspect of the foot. However she is completely insensate 01/10/2018; we are using Prisma to the wounds on her bilateral feet. Surprisingly the tunneling area over the left fourth metatarsal head that was part of  her original surgery has closed down. She has a small open area remaining on the incision line. 2 open areas in the midfoot. 02/10/2018; the patient arrives back in clinic after a month hiatus. She was traveling to visit family in [place]. Is fairly clear she was not offloading the areas on her feet. The original wound over the left lateral foot at the level of metatarsal heads is reopened and probes medially by about a centimeter or 2. She notes that a week ago she had purulent drainage come out of an area on the left midfoot. Paradoxically the worst area is actually on the right foot is extensive with purulent drainage. We will use silver alginate today 02/17/2018; the patient has 3 wounds one over the left lateral foot. She still has a small area over the metatarsal heads which is the remnant of her original surgical wound. This has medial probing depth of roughly 1.4 cm somewhat better than last week. The area on the right foot is larger. We have been using silver alginate to all areas. The area on the right foot and left foot that we cultured last week showed both Klebsiella and Proteus. Both of these are quinolone sensitive. The patient put her's self on Bactrim and Flagyl that she had left hanging around from prior antibiotic usages. She was apparently on this last week when she arrived. I did not realize this. Unfortunately the Bactrim will not cover either 1 of these organisms. We will send in Cipro 500 twice daily for a week 03/04/2018; the patient has 2 wounds on the left foot one is the original wound which was a surgical wound for a deep DFU. At one point this had exposed bone. She still has an area over the fourth metatarsal head that probes about 1.4 cm although I think this is better than last week. I been using silver nitrate to try and promote tissue adherence and been using silver alginate here. She also has an area in the left midfoot. This has some depth but a small linear wound.  Still requiring debridement. On the right midfoot is a circular wound. A lot of thick callus around this area. We have been using silver alginate to all wound areas She is completed the ciprofloxacin I gave her 2 weeks ago. 03/11/2018; the patient continues to have 2 open areas on the left foot 1 of which was the original surgical wound for a deep DFU. Only a small probing area remains although this is not much different from last week we have been using silver alginate. The other area is on the midfoot this is smaller linear but still with some depth. We have been using silver alginate here as well On the right foot she has a small circular wound in the mid aspect. This is not much smaller than last  time. We have been using silver alginate here as well 03/18/2018; she has 3 wounds on the left foot the original surgical wound, a very superficial wound in the mid aspect and then finally the area in the mid plantar foot. She arrives in today with a very concerning area in the wound in the mid plantar foot which is her most proximal wound. There is undermining here of roughly 1-1/2 cm superiorly. Serosanguineous drainage. She tells me she had some pain on for over the weekend that shot up her foot into her thigh and she tells me that she had a nodule in the groin area. She has the single wound in the right foot. We are using endoform to both wound areas 03/24/2018; the patient arrives with the original surgical wound in the area on the left midfoot about the same as last week. There is a collection of fluid under the surface of the skin extending from the surgical wound towards the midfoot although it does not reach the midfoot wound. The area on the right foot is about the same. Cultures from last week of the left midfoot wound showed abundant Klebsiella abundant Enterococcus faecalis and moderate methicillin resistant staph I gave her Levaquin but this would have only covered the Klebsiella. She  will need linezolid 04/01/2018; she is taking linezolid but for the first few days only took 1 a day. I have advised her to finish this at twice daily dosing. In any case all of her wounds are a lot better especially on the left foot. The original surgical wound is closed. The area on the left midfoot considerably smaller. The area on the right foot also smaller. 04/08/2018; her original surgical wound/osteomyelitis on the left foot remains closed. She has area on the left foot that is in the midfoot area but she had some streaking towards this. This is not connected with her original wound at least not visually. Small wound on the right midfoot appears somewhat smaller. 04/15/18; both wounds looks better. Original wound is better left midfoot. Using silver alginate 1/21; patient states she uses saltwater soak in, stones or remove callus from around her wounds. She is also concerned about a blood blister she had on the left foot but it simply resolved on its own. We've been using silver alginate 1/28; the patient arrives today with the same streaking area from her metatarsals laterally [the site of her original surgical wound] down to the middle of her foot. There is some drainage in the subcutaneous area here. This concerns me that there is actually continued ongoing infection in the metatarsals probably the fourth and third. This fixates an MRI of the foot without contrast [chronic renal failure] The wound in the mid part of the foot is small but I wonder whether this area actually connects with the more distal foot. The area on the right midfoot is probably about the same. Callus thick skin around the small wound which I removed with a curette we have been using silver alginate on both wound areas 2/4; culture I did of the draining site on the left foot last time grew methicillin sensitive staph aureus. MRI of the left foot showed interval resolution of the findings surrounding the third metatarsal  joint on the prior study consistent with treated osteomyelitis. Chronic soft tissue ulceration in the plantar and lateral aspect of the forefoot without residual focal fluid collection. No evidence of recurrent osteomyelitis. Noted to have the previous amputation of the distal first phalanx and fifth  ray MRI of the right foot showed no evidence of osteomyelitis I am going to treat the patient with a prolonged course of antibiotics directed against MSSA in the left foot 2/11; patient continues on cephalexin. She tells me she had nausea and vomiting over the weekend and missed 2 days. In general her foot looks much the same. She has a small open area just below the left fourth metatarsal head. A linear area in the left midfoot. Some discoloration extending from the inferior part of this into the left lateral foot although this appears to be superficial. She has a small area on the right midfoot which generally looks smaller after debridement 2/18; the patient is completing his cephalexin and has another 2 days. She continues to have open areas on the left and right foot. 2/25; she is now off antibiotics. The area on the left foot at the site of her original surgical wound has closed yet again. She still has open areas in the mid part of her foot however these appear smaller. The area on the right mid foot looks about the same. We have been using silver alginate She tells me she had a serious hypoglycemic spell at home. She had to have EMS called and get IV dextrose 3/3; disappointing on the left lateral foot large area of necrotic tissue surrounding the linear area. This appears to track up towards the same original surgical wound. Required extensive debridement. The area on the right plantar foot is not a lot better also using silver 3/12; the culture I did last time showed abundant enterococcus. I have prescribed Augmentin, should cover any unrecognized anaerobes as well. In addition there were a few  MRSA and Serratia that would not be well covered although I did not want to give her multiple antibiotics. She comes in today with a new wound in the right midfoot this is not connected with the original wound over her MTP a lot of thick callus tissue around both wounds but once again she said she is not walking on these areas 3/17-Patient comes in for follow-up on the bilateral plantar wounds, the right midfoot and the left plantar wound. Both these are heavily callused surrounding the wounds. We are continuing to use silver alginate, she is compliant with offloading and states she uses a wheelchair fairly often at home 3/24; both wound areas have thick callus. However things actually look quite a bit better here for the majority of her left foot and the right foot. 3/31; patient continues to have thick callused somewhat irritated looking tissue around the wounds which individually are fairly superficial. There is no evidence of surrounding infection. We have been using silver alginate however I change that to [facility] today 4/17; patient returns to clinic after having a scare with Covid she tested negative in her primary doctor's office. She has been using Hydrofera Blue. She does not have an open area on the right foot. On the left foot she has a small open area with the mid area not completely viable. She showed me pictures of what looks like a hemorrhagic blister from several days ago but that seems to have healed over this was on the lateral left foot 4/21; patient comes in to clinic with both her wounds on her feet closed. However over the weekend she started having pain in her right foot and leg up into the thigh. She felt as though she was running a low-grade fever but did not take her temperature. She took a  doxycycline that she had leftover and yesterday a single Septra and metronidazole. She thinks things feel somewhat better. 4/28; duplex ultrasound I ordered last week was negative  for DVT or superficial thrombophlebitis. She is completed the doxycycline I gave her. States she is still having a lot of pain in the right calf and right ankle which is no better than last week. She cannot sleep. She also states she has a temperature of up to 101, coughing and complaining of visual loss in her bilateral eyes. Apparently she was tested for Covid 2 weeks ago at [facility] and that was negative. Readmission: 09/03/18 patient presents back for reevaluation after having been evaluated at the end of April regarding erythema and swelling of her right lower extremity. Subsequently she ended up going to the hospital on 07/29/18 and was admitted not to be discharged until 08/08/18. Unfortunately it was noted during the time that she was in the hospital that she did have methicillin-resistant Staphylococcus aureus as the infection noted at the site. It was also determined that she did have osteomyelitis which appears to be fairly significant. She was treated with vancomycin and in fact is still on IV vancomycin at dialysis currently. This is actually slated to continue until 09/12/18 at least which will be the completion of the six weeks of therapy. Nonetheless based on what I'm seeing at this point I'm not sure she will be anywhere near ready to discontinue antibiotics at that time. Since she was released from the hospital she was seen by [name] who is her podiatrist on 08/27/18. His note specifically states that he is recommended that the patient needs of one knee amputation on the right as she has a life- threatening situation that can lead quickly to sepsis. The patient advised she would like to try to save her leg to which [name] apparently told her that this was against all medical advice. She also want to discontinue the Wound VAC which had been initiated due to the fact that she wasn't pleased with how the wound was looking and subsequently she wanted to pursue applying Medihoney at that time.  He stated that he did not believe that the right lower extremity was salvageable and that the patient understood but would still like to attempt hyperbaric option therapy if it could be of any benefit. She was therefore referred back to [place] for further evaluation. He plans to see her back next week. Upon inspection today patient has a significant amount purulent drainage noted from the wound at this point. The bone in the distal portion of her foot also appears to be extremely necrotic and spongy. When I push down on the bone it bubbles and seeps purulent drainage from deeper in the end of the foot. I do not think that this is likely going to heal very well at all and less aggressive surgical debridement were undertaken more than what I believe we can likely do here in our office. 09/12/2018; I have not seen this patient since the most recent hospitalization although she was in our clinic last week. I have reviewed some of her records from a complex hospitalization. 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 There is situation was complicated by MRSA bacteremia and acute on chronic renal failure now on dialysis. She is receiving vancomycin at dialysis. We started her on Dakin's wet-to-dry last week she is changing this daily. There is still purulent drainage coming out of her foot.  Although she is apparently "agreeable" to a below-knee amputation which is been suggested by multiple clinicians she wants this to be done in [place]. She apparently has a telehealth visit with that provider sometime in late Tye 6/24. I have told her I think this is probably too long. Nevertheless I could not convince her to allow a local doctor to perform BKA. 09/19/2018; the patient has a large necrotic area on the right anterior foot. She has had previous transmetatarsal amputations. Culture I did last week showed MRSA nothing else she is on vancomycin at dialysis. She  has continued leaking purulent drainage out of the distal part of the large circular wound on the right anterior foot. She apparently went to see Dr. 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 Somehow that translated into her being referred to plastic surgery for debridement of the area. I gather she basically refused amputation although I do not have a copy of Dr. Berenice Primas notes. The patient really wants to have a trial of hyperbaric oxygen. I agreed with initial assessment in this clinic that this was probably too far along to benefit however if she is going to have plastic surgery I think she would benefit from ancillary hyperbaric oxygen. The issue here is that the patient has benefited as maximally as any patient I have ever seen from hyperbaric oxygen therapy. Most recently she had exposed bone on the lateral part of her left foot after a surgical procedure and that actually has closed. She has eschared areas in both heels but no open area. She is remained systemically well. I am not optimistic that anything can be done about this but the patient is very clear that she wants an attempt. The attempt would include a wound VAC further debridements and hyperbaric oxygen along with IV antibiotics. 6/26; I put her in for a trial of hyperbaric oxygen only because of the dramatic response she has had with wounds on her left midfoot earlier this year which was a surgical wound that went straight to her bone over the metatarsal heads and also remotely the left third toe. We will see if we can get this through our review process and insurance. She arrives in clinic with again purulent material pouring out of necrotic bone on the top of the foot distally. There is also some concerning erythema on the front of the leg that we marked. It is bit difficult to tell how tender this is because of neuropathy. I note from infectious disease that she had her vancomycin extended. All the  cultures of these areas have shown MRSA sensitive to vancomycin. She had the wound VAC on for part of the week. The rest of the time she is putting various things on this including Medihoney, "ionized water" silver sorb gel etc. 7/7; follow-up along with HBO. She is still on vancomycin at dialysis. She has a large open area on the dorsal right foot and a small dark eschar area on her heel. There is a lot less erythema in the area and a lot less tenderness. From an infection point of view I think this is better. She still has a lot of necrosis in the remaining right forefoot [previous TMA] we are still using the wound VAC in this area 7/16; follow-up along with HBO. I put her on linezolid after she finished her vancomycin. We started this last Friday I gave her 2 weeks worth. I had the expectation that she would be operatively debrided by Dr. Marla Roe  but that still has not happened yet. Patient phoned the office this week. She arrives for review today after HBO. The distal part of this wound is completely necrotic. Nonviable pieces of tendon bone was still purulent drainage. Also concerning that she has black eschar over the heel that is expanding. I think this may be indicative of infection in this area as well. She has less erythema and warmth in the ankle and calf but still an abnormal exam 7/21 follow-up along with HBO. I will renew her linezolid after checking a CBC with differential monitoring her blood counts especially her platelets. She was supposed to have surgery yesterday but if I am reading things correctly this was canceled after her blood sugar was found to be over 500. I thought Dr. Marla Roe who called me said that they were sending her to the ER but the patient states that was not the case. 7/28. Follow-up along with HBO. She is on linezolid I still do not have any lab work from dialysis even though I called last week. The patient is concerned about an area on her left lateral  foot about the level of the base of her fifth metatarsal. I did not really see anything that ominous here however this patient is in [place] ability to point out problems that she is sensing and she has been accurate in the past Finally she received a call from Dr. Marla Roe who is referring her to another orthopedic surgeon stating that she is too booked up to take her to the operating room now. Was still using a wound VAC on the foot 8/3 -Follow-up after HBO, she is got another week of linezolid, she is to call ID for an appointment, x-rays of both feet were reviewed, the left foot x-ray with third MTP joint osteo- Right foot x-ray widespread osteo-in the right midfoot Right ankle x-ray does not show any active evidence of infection 8/11-Patient is seen after HBO, the wounds on the right foot appear to be about the same, the heel wound had some necrotic base over tendon that was debrided with a curette 8/21; patient is seen after HBO. The patient's wound on her dorsal foot actually looks reasonably good and there is substantial amount of epithelialization however the open area distally still has a lot of necrotic debris partially bone. I cannot really get a good sense of just how deep this probes under the foot. She has been pressuring me this week to order medical maggots through a company in [place] for her. The problem I have is there is not a defined wound area here. On the positive side there is no purulence. She has been to see infectious disease she is still on Septra DS although I have not had a chance to review their notes 8/28; patient is seen in conjunction with HBO. The wounds on her foot continued to improve including the right dorsal foot substantially the, the distal part of this wound and the area on the right heel. We have been using a wound VAC over this chronically. She is still on trimethoprim as directed by infectious disease 9/4; patient is seen in conjunction with HBO.  Right dorsal foot wound substantially anteriorly is better however she continues to have a deep wound in the distal part of this that is not responding. We have been using silver collagen under border foam Area on the right plantar medial heel seems better. We have been using Hydrofera Blue 12/12/18 on evaluation today patient appears to be  doing about the same with regard to her wound based on prior measurements. She does have some necrotic tissue noted on the lateral aspect of the wound that is going require a little bit of sharp debridement today. This includes what appears to be potentially either severely necrotic bone or tendon. Nonetheless other than that she does not appear to have any severe infection which is good news 9/18; it is been 2 weeks since I saw this wound. She is tolerating HBO well. Continued dramatic improvement in the area on the right dorsal foot. She still has a small wound on the heel that we have been using Hydrofera Blue. She continues with a wound VAC 9/24; patient has to be seen emergently today with a swelling on her right lateral lower leg. She says that she told Dr. Evette Doffing about this and also myself on a couple of occasions but I really have no recollection of this. She is not systemically unwell and her wound really looked good the last time I saw this. She showed this to providers at dialysis and she was able to verify that she was started on cephalexin today for 5 doses at dialysis. She dialyzes on Tuesday Thursday and Saturday. 10/2; patient is seen in conjunction with HBO. The area that is draining on the right anterior medial tibia is more extensive. Copious amounts of serosanguineous drainage with some purulence. We are still using the wound VAC on the original wound then it is stable. Culture I did of the original IandD showed MRSA I contacted dialysis she is now on vancomycin with dialysis treatments. I asked them to run a month 10/9; patient seen in  conjunction with HBO. She had a new spontaneous open area just above the wound on the right medial tibia ankle. More swelling on the right medial tibia. Her wound on the foot looks about the same perhaps slightly better. There is no warmth spreading up her leg but no obvious erythema. her MRI of the foot and ankle and distal tib-fib is not booked for next Friday I discussed this with her in great detail over multiple days. it is likely she has spreading infection upper leg at least involving the distal 25% above the ankle. She knows that if I refer her to orthopedics for infectious disease they are going to recommend amputation and indeed I am not against this myself. We had a good trial at trying to heal the foot which is what she wanted along with antibiotics debridement and HBO however she clearly has spreading infection [probably staph aureus/MRSA]. Nevertheless she once again tells me she wants to wait the left of the MRI. She still makes comments about having her amputation done in [place]. 10/19; arrives today with significant swelling on the lateral right leg. Last culture I did showed Klebsiella. Multidrug-resistant. Cipro was intermediate sensitivity and that is what I have her on pending her MRI which apparently is going to be done on Thursday this week although this seems to be moving back and forth. She is not systemically unwell. We are using silver alginate on her major wound area on the right medial foot and the draining areas on the right lateral lower leg 10/26; MRI showed extensive abscess in the anterior compartment of the right leg also widespread osteomyelitis involving osseous structures of the midfoot and portions of the hindfoot. Also suspicion for osteomyelitis anterior aspect of the distal medial malleolus. Culture I did of the purulence once again showed a multidrug-resistant Klebsiella. I have been  in contact with nephrology late last week and she has been started on  cefepime at dialysis to replace the vancomycin We sent a copy of her MRI report to Dr. Geroge Baseman in [place] who is an orthopedic surgeon. The patient takes great stock in his opinion on this. She says she will go to [place] to have her leg amputated if Dr. Geroge Baseman does not feel there is any salvage options. 11/2; she still is not talk to her orthopedic surgeon in [place]. Apparently he will call her at [phone] this afternoon. The quality of this is she has not allowed me to refer her anywhere. She has been told over and over that she needs this amputated but has not agreed to be referred. She tells me her blood sugar was 600 last night but she has not been febrile. 11/9; she never did got a call from the orthopedic surgeon in [place] therefore that is off the radar. We have arranged to get her see orthopedic surgery at [facility]. She still has a lot of draining purulence coming out of the new abscess in her right leg although that probably came from the osteomyelitis in her right foot and heel. Meanwhile the original wound on the right foot looks very healthy. Continued improvement. The issue is that the last MRI showed osteomyelitis in her right foot extensively she now has an abscess in the right anterior lower leg. There is nobody in [place] who will offer this woman anything but an amputation and to be honest that is probably what she needs. I think she still wants to talk about limb salvage although at this point I just do not see that. She has completed her vancomycin at dialysis which was for the original staph aureus she is still on cefepime for the more recent Klebsiella. She has had a long course of both of these antibiotics which should have benefited the osteomyelitis on the right foot as well as the abscess. 11/16; apparently [place] elective surgery is shut down because of COVID-19 pandemic. I have reached out to some contacts at [facility] to see if we can get her an  orthopedic appointment there. I am concerned about continually leaving this but for the moment everything is static. In fact her original large wound on this foot is closing down. It is the abscess on the right anterior leg that continues to drain purulent serosanguineous material. She is not currently on any antibiotics however she had a prolonged course of vancomycin [1 month] as well as cefepime for a month 02/24/2019 on evaluation today patient appears to be doing better than the last time I saw her. This is not a patient that I typically see. With that being said I am covering for Dr. Dellia Nims this week and again compared to when I last saw her overall the wounds in particular seem to be doing significantly better which is good news. With that being said the patient tells me several disconcerting things. She has not been able to get in to see anyone for potential debridement in regard to her leg wounds although she tells me that she does not think it is necessary any longer because she is taking care of that herself. She noticed a string coming out of the lower wound on her leg over the last week. The patient states that she subsequently decided that we must of pack something in there and started pulling the string out and as it kept coming and coming she realized this was likely her  tendon. With that being said she continued to remove as much of this as she could. She then I subsequently proceeded to using tubes of antibiotic ointment which she will stick down into the wound and then scored as much as she can until she sees it coming out of the other wound opening. She states that in doing this she is actually made things better and there is less redness and irritation. With regard to her foot wound she does have some necrotic tendon and tissue noted in one small corner but again the actual wound itself seems to be doing better with good granulation in general compared to my last evaluation. 12/7;  continued improvement in the wound on the substantial part of the right medial foot. Still a necrotic area inferiorly that required debridement but the rest of this looks very healthy and is contracting. She has 2 wounds on the right lateral leg which were her original drainage sites from her abscess but all of this looks a lot better as well. She has been using silver alginate after putting antibiotic biotic ointment in one wound and watching it come out the other. I have talked to her in some detail today. I had given her names of orthopedic surgeons at [facility] for second opinion on what to do about the right leg. I do not think the patient never called them. She has not been able to get a hold of the orthopedic surgeon in [place] that she had put a lot of faith in as being somebody would give her an opinion that she would trust. I talked to her today and said even if I could get her in to another orthopedic surgeon about the leg which she accept an amputation and she said she would not therefore I am not going to press this issue for the moment 12/14; continued improvement in his substantial wound on the right medial foot. There is still a necrotic area inferiorly with tightly adherent necrotic debris which I have been working on debriding each time she is here. She does not have an orthopedic appointment. Since last time she was here I looked over her cultures which were essentially MRSA on the foot wound and gram-negative rods in the abscess on the anterior leg. 12/21; continued improvement in the area on the right medial foot. She is not up on this much and that is probably a good thing since I do not know it could support continuous ambulation. She has a small area on the right lateral leg which were remanence of the IandD's I did because of the abscess. I think she should probably have prophylactic antibiotics I am going to have to look this over to see if we can make an intelligent  decision here. In the meantime her major wound is come down nicely. Necrotic area inferiorly is still there but looks a lot better 04/06/2019; she has had some improvement in the overall surface area on the right medial foot somewhat narrowedr both but somewhat longer. The areas on the right lateral leg which were initial IandD sites are superficial. Nothing is present on the right heel. We are using silver alginate to the wound areas 1/18; right medial foot somewhat smaller. Still a deep probing area in the most distal recess of the wound. She has nothing open on the right leg. She has a new wound on the plantar aspect of her left fourth toe which may have come from just pulling skin. The patient using Medihoney on  the wound on her foot under silver alginate. I cannot discourage her from this 2/1; 2-week follow-up using silver alginate on the right foot and her left fourth toe. The area on the right dorsal foot is contracted although there is still the deep area in the most distal part of the wound but still has some probing depth. No overt infection 2/15; 2-week follow-up. She continues to have improvement in the surface area on the dorsal right foot. Even the tunneling area from last time is almost closed. The area that was on the plantar part of her left fourth toe over the PIP is indeed closed 3/1; 2-week follow-up. Continued improvement in surface area. The original divot that we have been debriding inferiorly I think has full epithelialization although the epithelialization is gone down into the wound with probably 4 mm of depth. Even under intense illumination I am unable to see anything open here. The remanence of the wound in this area actually look quite healthy. We have been using silver alginate 3/15; 2-week follow-up. Unfortunately not as good today. She has a comma shaped wound on the dorsal foot however the upper part of this is larger. Under illumination debris on the surface She also  tells [place] that she was on her right leg 2 times in the last couple of weeks mostly to reach up for things above her head etc. She felt a sharp pain in the right leg which she thinks is somewhere from the ankle to the knee. The patient has neuropathy and is really uncertain. She cannot feel her foot so she does not think it was coming from there 3/29; 2-week follow-up. Her wound measures smaller. Surface of the wound appears reasonable. She is using silver alginate with underlying Medihoney. She has home health. X-rays I did of her tib-fib last time were negative although it did show arterial calcification 4/12; 2-week follow-up. Her wound measures smaller in length. Using manuka honey with silver alginate on top. She has home health. 4/26; 2-week follow-up. Her wound is smaller but still very adherent debris under illumination requiring debridement she has been using manuka honey with silver alginate. 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 Been using Hydrofera Blue. She is on suppressive Bactrim but apparently she has been taking it twice daily 6/7; I have not seen this wound and about 6 weeks. Since then she was up in [place]. By her own admission she was walking on the foot because she did not have a wheelchair. The wound is not nearly as healthy looking as it was the last time I saw this. We ordered different things for her but she only uses Medihoney and silver alginate. As far as I know she is on suppressive trimethoprim sulfamethoxazole. She does not admit to any fever or chills. Her CBGs apparently are at baseline however she is saying that she feels some discomfort on the lateral part of her ankle I looked over her last inflammatory markers from the summer 2020 at which time she had a deeply necrotic infected wound in this area. On 11/10/2018 her sedimentation rate was 56 and C-reactive  protein 9.9. This was 107 and 29 on 07/29/2018. 6/17; the patient had a necrotic wound the last time she was here on the right dorsal foot. After debridement I did a culture. This showed a very resistant ESBL Klebsiella as well as Enterococcus. Her  x-ray of the foot which was done because of warmth and some discomfort showed bone destruction within the carpal bones involving the navicular acute cuboid lateral middle cuneiforms but essentially unchanged from her prior study which was done on 10/29/2018. The findings were felt to represent chronic osteomyelitis. We did inflammatory markers on her. Her white count was 5.25 sedimentation rate 16 and C-reactive protein at 11.1. Notable for the fact that in August 2020 her CRP was 9.9 and sedimentation rate 56. I have looked at her x-rays. It is true that the bone destruction is very impressive however the patient came into this clinic for the wound on her right foot with pieces of bone literally falling out anteriorly with purulent material. I am not exactly sure I could have expected anything different. She has not been systemically unwell no fever chills or blood sugars have been reasonable. 6/28; she arrives with a right heel closed. The substantial area on the right anterior foot looks healthy. Much better looking surface. I think we can change to [facility] seems to help this previously. She is getting her antibiotics at dialysis she should be just about finished 7/9; changed to [facility] last week. Surface wound looks satisfactory not much change in surface area however. She is going to [place] state next week this is usually a difficult thing for this patient follow-up will be for 2 weeks. 7/23; using Hydrofera Blue. She returns from her trip and the wound looks surprisingly good. Usually when this patient goes on trips she comes back with a lot of problems with the wound. She is saying that she sometimes feels an episodic "crunching"  feeling on the lateral part of the foot. She is neuropathic and not feeling pain but wonders whether this could be a neuropathic dysesthesia. 11/13/19-Patient returns after 3 weeks, the wound itself is stable and patient states that there is nothing new going on she is on some extra anxiety medications and is resisting the temptation to pick at the dry skin around the wound. 9/20; patient has not been here in over a month and I have not seen her in 2 months. The wound in terms of size I think is about the same. There is no exposed bone. She has a nonviable surface on this. She is supposed to be using [facility] however she is also been using some form of honey preparation as well as a silver-based dressings. I do not think she has any pattern to this. 10/4; 2-week follow-up. Patient has been using some form of spray which she says has honey and silver to purchase this online she has been covering it with gauze. In spite of this the wound actually looks quite good. The deeper divot distally appears to be close down. There is a rim of epithelialization. 10/18; 2-week follow-up. Patient has been using her Hydrofera Blue covered with her silver honey spray that she got online. Electronic Signature(s) Signed: 01/19/2020 4:22:08 PM By: Linton Ham MD Entered By: Linton Ham on 01/18/2020 13:27:09 -------------------------------------------------------------------------------- Physical Exam Details Patient Name: Date of Service: [name] 01/18/2020 12:30 PM Medical Record Number: [id number] Patient Account Number: [id number] Date of Birth/Sex: Treating RN: January 16, 1971 (49 y.o. Nancy Fetter Primary Care Provider: [name], [name] Other Clinician: Referring Provider: Treating Provider/Extender: [name], [name] Weeks in Treatment: 8 Constitutional Patient is hypertensive.. Pulse regular and within target range for patient.Marland Kitchen Respirations regular,  non-labored and within target range.. Temperature is  normal and within the target range for the patient.Marland Kitchen Appears in no distress. Cardiovascular Pedal pulses are palpable. Notes Wound exam; the patient has a an expanding area of epithelialization below the wound. This is definitely measuring smaller. 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 Electronic Signature(s) Signed: 01/19/2020 4:22:08 PM By: Linton Ham MD Entered By: Linton Ham on 01/18/2020 13:28:17 -------------------------------------------------------------------------------- Physician Orders Details Patient Name: Date of Service: [name] 01/18/2020 12:30 PM Medical Record Number: [id number] Patient Account Number: [id number] Date of Birth/Sex: Treating RN: 17-Jul-1970 (49 y.o. Nancy Fetter Primary Care Provider: [name], [name] Other Clinician: Referring Provider: Treating Provider/Extender: [name], [name] Weeks in Treatment: 34 Verbal / Phone Orders: No Diagnosis Coding ICD-10 Coding Code Description M86.671 Other chronic osteomyelitis, right ankle and foot L97.514 Non-pressure chronic ulcer of other part of right foot with necrosis of bone L97.411 Non-pressure chronic ulcer of right heel and midfoot limited to breakdown of skin E10.621 Type 1 diabetes mellitus with foot ulcer Follow-up Appointments Return Appointment in 2 weeks. Dressing Change Frequency Wound #43 Right,Medial Foot Change Dressing every other day. Wound Cleansing Wound #43 Right,Medial Foot Clean wound with Wound Cleanser - or normal saline Primary Wound Dressing Wound #43 Right,Medial Foot Hydrofera Blue Secondary Dressing Wound #43 Right,Medial Foot Kerlix/Rolled Gauze Dry Gauze ABD pad Other: - ACE wrap Edema Control Elevate legs to the level of the heart or above for 30 minutes daily and/or when sitting, a  frequency of: - throughout the day [place] skilled [facility] for wound care. - Interim Electronic Signature(s) Signed: 01/18/2020 5:42:40 PM By: [name] RN, BSN Signed: 01/19/2020 4:22:08 PM By: Linton Ham MD Entered By: [name] on 01/18/2020 13:24:33 -------------------------------------------------------------------------------- Problem List Details Patient Name: Date of Service: [name] 01/18/2020 12:30 PM Medical Record Number: [id number] Patient Account Number: [id number] Date of Birth/Sex: Treating RN: [birth year] (49 y.o. Nancy Fetter Primary Care Provider: [name], [name] Other Clinician: Referring Provider: Treating Provider/Extender: [name], [name] Weeks in Treatment: 83 Active Problems ICD-10 Encounter Code Description Active Date MDM Diagnosis M86.671 Other chronic osteomyelitis, right ankle and foot 09/03/2018 No Yes L97.514 Non-pressure chronic ulcer of other part of right foot with necrosis of bone 09/03/2018 No Yes L97.411 Non-pressure chronic ulcer of right heel and midfoot limited to breakdown of 09/17/2019 No Yes skin E10.621 Type 1 diabetes mellitus with foot ulcer 09/24/2018 No Yes Inactive Problems ICD-10 Code Description Active Date Inactive Date L97.521 Non-pressure chronic ulcer of other part of left foot limited to breakdown of skin 04/20/2019 04/20/2019 L97.812 Non-pressure chronic ulcer of other part of right lower leg with fat layer exposed 02/24/2019 02/24/2019 Resolved Problems ICD-10 Code Description Active Date Resolved Date L02.415 Cutaneous abscess of right lower limb 12/25/2018 12/25/2018 Electronic Signature(s) Signed: 01/19/2020 4:22:08 PM By: Linton Ham MD Entered By: Linton Ham on 01/18/2020 13:26:20 -------------------------------------------------------------------------------- Progress Note Details Patient Name: Date of Service: GA LLO Abbott Pao  [name] 01/18/2020 12:30 PM Medical Record Number: [id number] Patient Account Number: [id number] Date of Birth/Sex: Treating RN: [birth year] (49 y.o. Nancy Fetter Primary Care Provider: [name], [name] Other Clinician: Referring Provider: Treating Provider/Extender: [name], [name] Weeks in Treatment: 65 Subjective History of Present Illness (HPI) 49 year old diabetic who is known to have type 1 diabetes which is  poorly controlled last hemoglobin A1c was 11%. She comes in with a ulcerated area on the left lateral foot which has been there for over 6 months. Was recently she has been treated by [name] of podiatry who saw her last on 05/28/2016. Review of his notes revealed that the patient had incision and drainage with placement of antibiotic beads to the left foot on 04/11/2016 for possible osteomyelitis of the cuboid bone. Over the last year she's had a history of amputation of the left fifth toe and a femoropopliteal popliteal bypass graft somewhere in April 2017. 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 His note [name] mentions that the patient has been referred to me for further wound care and possibly great candidate for hyperbaric oxygen therapy due to recurrent osteomyelitis. However we do not have any x-rays of biopsy reports confirming this. He has been on several antibiotics including Bactrim and most recently is on doxycycline for an MRSA. I understand, the patient was not a candidate for IV antibiotics as she has had previous PICC lines which resulted in blood clots in both arms. There was a x-ray report dated 04/04/2016 on [name] notes which showed evidence of fifth ray resection left foot with osteolytic changes noted to the fourth metatarsal and cuboid bone on the left. 06/13/2016 -- had a left foot x-ray which showed no acute fracture or dislocation and no definite radiographic evidence of osteomyelitis. Advanced osteopenia was  seen. 06/20/2016 -- she has noticed a new wound on the right plantar foot in the region where she had a callus before. 06/27/16- the patient did have her x-ray of the right foot which showed no findings to suggest osteomyelitis. She saw her endocrinologist, [name], yesterday. Her A1c in January was 11. He also indicates mismanagement and noncompliance regarding her diabetes. She is currently on Bactrim for a lip infection. She is complaining of nausea, vomiting and diarrhea. She is unable to articulate the exact orders or dosing of the Bactrim; it is unclear when she will complete this. 07/04/2016 -- results from [facility] of ABIs with ankle waveforms were noted from 02/14/2016. The examination done on 06/27/2015 showed noncompressible ABIs with the right being 1.45 and the left being 1.33. The present examination showed a right ABI of 1.19 on the left of 1.33. The conclusion was that right normal ABI in the lower extremity at rest however compared to previous study which was noncompressible ABI may be falsely elevated side suggesting medial calcification. The left ABI suggested medial calcification. 08/01/2016 -- the patient had more redness and pain on her right foot and did not get to come to see as noted she see her PCP or go to the ER and decided to take some leftover metronidazole which she had at home. As usual, the patient does report she feels and is rather noncompliant. 08/08/2016 -- -- x-ray of the right foot -- FINDINGS:Transmetatarsal amputation is noted. No bony destruction is noted to suggest osteomyelitis. IMPRESSION: No evidence of osteomyelitis. Postsurgical changes are seen. MRI would be more sensitive for possible bony changes. Culture has grown Serratia Marcescens -- sensitive to Bactrim, ciprofloxacin, ceftazidime she was seen by [name] on 08/06/2016. He did not find any exposed bone, muscle, tendon, ligament or joint. There was no malodor and he did a excisional  debridement in the office. ============ Old notes: 49 year old patient who is known to the wound clinic for a while had been away from the wound clinic since 09/01/2014. Over  the last several months she has been admitted to various hospitals including [place] [facility] [place]. She was treated for a right metatarsal osteomyelitis with a transmetatarsal amputation and this was done about 2 months ago. He has a small ulcerated area on the right heel and she continues to have an ulcerated area on the left plantar aspect of the foot. The patient was recently admitted to the [facility] hospital group between 7/12 and 10/18/2014. she was given 3 weeks of IV vancomycin and was to follow-up with her surgeons at [facility] and also took oral vancomycin for C. difficile colitis. 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 MRI of the left foot done on 10/14/2014 showed no abscess or osteomyelitis. 04/27/15; this is a patient we know from previous stays in the wound care center. She is a type I diabetic I am not sure of her control currently. Since the last time I saw her she is had a right transmetatarsal amputation and has no wounds on her right foot and has no open wounds. She is been followed at the wound care center at [facility] in [place]. She comes today with the desire to undergo hyperbaric treatment locally. Apparently one of her wound care providers in [place] has suggested hyperbarics. This is in response to an MRI from 04/18/15 that showed increased marrow signal and loss of the proximal fifth metatarsal cortex evidence of osteomyelitis with likely early osteomyelitis in the cuboid bone as well. She has a large wound  over the base of the fifth metatarsal. She also has a eschar over her the tips of her toes on 1,3 and 5. She does not have peripheral pulses and apparently is going for an angiogram tomorrow which seems reasonable. After this she is going to infectious disease at [facility]. They have been using Medihoney to the large wound on the lateral aspect of the left foot to. The patient has known Charcot deformity from diabetic neuropathy. She also has known diabetic PAD. Surprisingly I can't see that she has had any recent antibiotics, the patient states the last antibiotic she had was at the end of November for 10 days. I think this was in response to culture that showed group G strep although I'm not exactly sure where the culture was from. She is also had arterial studies on 03/29/15. This showed a right ABI of 1.4 that was noncompressible. Her left ABI was 0.73. 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 It was not felt that arterial inflow was adequate for healing of a foot ulcer. Her Doppler waveforms looked monophasic ===== READMISSION 02/28/17; this is in an now 49 year old woman we've had at several different occasions in this clinic. 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 She has a remote ex-smoker. She was last seen in this clinic by Dr. Con Memos I think in May. More recently she is been followed by her podiatrist [name] an infectious disease Dr. Megan Salon. She has 2 open wounds the major one is over the right first metatarsal head she also has a wound on the left plantar foot. an MRI of the right foot on 01/01/17 showed a soft tissue ulcer along the plantar aspect of the first metatarsal base consistent with osteomyelitis of the  first metatarsal stump. Dr. 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 According to the patient this is been open for slightly over  a month. She has been on a combination of Cipro 500 twice a day, Zyvox 600 twice a day and Flagyl 500 3 times a day for over a month now as directed by Dr. Megan Salon. cultures of the right foot earlier this year showed MRSA in January and Serratia in May. January also had a few viridans strep. Recent x-rays of both feet were done and [name] office and I don't have these reports. The patient has known PAD and has a history of aleft femoropopliteal bypass in April 2017. 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 Dwyane Dee. In spite of this the last hemoglobin A1c I can see is 10.1 on 01/01/2017. She is being referred by 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 She is been using Medihoney to this area and also an area on the left midfoot. She is using healing sandals bilaterally. ABIs in this clinic at the left posterior tibial was 1.1 noncompressible on the right READMISSION Non invasive vascular NOVANT 5/18 Aftercare following surgery of the circulatory system Procedure Note - Interface, External Ris In - 08/13/2016 11:05 AM EDT Procedure: Examination consists of physiologic resting arterial pressures of the brachial and ankle arteries bilaterally with continuous wave Doppler waveform analysis. Previous: Previous exam performed on 02/14/16 demonstrated ABIs of Rt = 1.19 and Lt = 1.33. Right: ABI = non-compressible PT 1.47 DP. S/P transmet amputation. , Left: ABI = 1.52, 2nd digit pressure = 87 mmHg Conclusions: Right: ABI (>1.3) may be falsely elevated, suggesting medial calcification. Left: ABI (>1.3) may be falsely elevated, suggesting medial calcification The patient is a now 49 year old type I diabetic is had multiple issues her graded to chronic  diabetic foot ulcers. 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 We had her in the clinic lastin November. At that point she had wounds on her bilateral feet.she had wanted to try hyperbarics however the healogics review process denied her because she hadn't followed up with her vascular surgeon for her left femoropopliteal bypass. The bypass was done by Dr. Raul Del at [facility]. We made her a follow-up with Dr. Raul Del however she did not keep the appointment and therefore she was not approved The patient shows me a small wound on her left fourth metatarsal head on her phone. She developed rapid discoloration in the plantar aspect of the left foot and she was admitted to hospital from 2/2 through 05/10/17 with wet gangrene of the left foot osteomyelitis of the fourth metatarsal heads. She was admitted acutely ill with a temperature of 103. She was started on broad-spectrum vancomycin and cefepime. On 05/06/17 she was taken to the OR by [name] her podiatric surgeon for an incision and drainage irrigation of the left foot wound. Cultures from this surgery revealed group be strep and anaerobes. she was seen by Dr.Xu of orthopedic surgery and scheduled for a below-knee amputation which she u refused. Ultimately she was discharged on Levaquin and Flagyl for one month. MRI 05/05/17 done while she was in the hospital showed abscess adjacent to  the fourth metatarsal head and neck small abscess around the fourth flexor tendon. Inflammatory phlegmon and gas in the soft tissues along the lateral aspect of the fourth phalanx. Findings worrisome for osteomyelitis involving the fourth proximal and middle phalanx and also the third and fourth metatarsals. Finally the patient had actually shortly before this followed up with Dr. Raul Del at no time on 04/29/17. He felt that her left femoropopliteal bypass was patent he felt that her  left-sided toe pressures more than adequate for healing a wound on the left foot. This was before her acute presentation. 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 05/28/17; she is started hyperbarics. The patient tells me that for some reason she was not actually on Levaquin but I think on ciprofloxacin. She was on Flagyl. She only started her Levaquin yesterday due to some difficulty with the pharmacy and perhaps her sister picking it up. She has an appointment with [name] tomorrow and with infectious disease early next week. She has no new complaints 06/06/17; the patient continues in hyperbarics. She saw [name] on 05/29/17 who is her podiatric surgeon. He is elected for a transmetatarsal amputation on 06/27/17. I'm not sure at what level he plans to do this amputation. The patient is unaware ooShe also saw Dr. Megan Salon of infectious disease who elected to continue her on current antibiotics I think this is ciprofloxacin and Flagyl. I'll need to clarify with her tomorrow if she actually has this. We're using silver alginate to the actual wound. Necrotic surface today with material under the flap of her foot. ooOriginal MRI showed abscesses as well as osteomyelitis of the proximal and middle fourth phalanx and the third and fourth metatarsal heads 06/11/17; patient continues in hyperbarics and continues on oral antibiotics. She is doing well. The wound looks better. The necrotic part of this under the flap in her superior foot also looks better. she is been to see [name]. I haven't had a chance to look at his note. Apparently he has put the transmetatarsal amputation on hold her request it is still planning to take her to the OR for debridement and product application ACEL. I'll see if I can find his note. I'll therefore leave product ordering/requests to [name] for now. I was going to look at Dermagraft 06/18/17-she is here in follow-up evaluation for bilateral foot wounds. She continues  with hyperbaric therapy. She states she has been applying manuka honey to the right plantar foot and alternate manuka honey and silver alginate to the left foot, despite our orders. We will continue with same treatment plan and she will follo up next week. 06/25/17; I have reviewed [name] last note from 3/11. She has operative debridement in 2 days' time. By review his note apparently they're going to place there is skin over the majority of this wound which is a good choice. She has a small satellite area at the most proximal part of this wound on the left plantar foot. The area on the right plantar foot we've been using silver alginate and it is close to healing. 07/02/17; unfortunately the patient was not easily approved for [name] proposed surgery. I'm not completely certain what the issue is. She has been using silver alginate to the wound she has completed a first course of hyperbarics. She is still on Levaquin and Flagyl. I have really lost track of the time course here.I suspect she should have another week to 2 of antibiotics. I'll need to see if she is  followed up with infectious disease Dr. Megan Salon 07/09/17; the patient is followed up with Dr. Megan Salon. 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 She continues on Levaquin and metronidazole continuing both of these for now I think she is been on fr about 6 weeks. She still has some drainage but no pain. No fever. Her had been plans for her to go to the OR for operative debridement with her podiatrist [name], I am not exactly sure where that is. I'll probably slip a note to [name] today. I note that she follows with Dr. Dwyane Dee of endocrinology. We have her recertified for hyperbaric oxygen. I have not heard about Dermagraft however I'll see if [name] is planning a skin substitute as well 07/16/17; the patient tells me she is just about out of [place]. I'll need to check Dr. Hale Bogus last  notes on this. She states she has plenty of Flagyl however. She comes in today complaining of pain in the right lateral foot which she said lasted for about a day. The wound on the right foot is actually much more medially. She also tells me that the [facility] cost a lot of pain in the left foot wound and she turned back to silver alginate. Finally Dermagraft has a $628 per application co-pay. She cannot afford this 07/23/17; patient arrives today with the wound not much smaller. There is not much new to add. She has not heard from [name] all try to put in a call to them today. She was asking about Dermagraft again and she has an over $315 per application co-pay she states that she would be willing to try to do a payment plan. I been tried to avoid this. We've been using silver alginate, I'll change to [facility] 07/30/17-She is here in follow-up evaluation for left foot ulcer. She continues hyperbaric medicine. The left foot ulcer is stable we will continue with same treatment plan 08/06/17; she is here for evaluation of her left foot ulcer. Currently being treated for hyperbarics or underlying osteomyelitis. She is completed antibiotics. The left foot ulcer is better smaller with healthier looking granulation. For various reasons I am not really clear on we never got her back to the OR with [name]. He did not respond to my secure text message. Nevertheless I think that surgery on this point is not necessary nor am I completely clear that a skin substitute is necessary The patient is complaining about pain on the outside of her right foot. 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 There is no erythema. She also states the foot is warm versus her other part of her upper leg and this is largely true. It is not totally clear to me what's causing this. She thinks it's different from her usual neuropathy pain 08/13/17; she arrives in clinic today with a small wound which is  superficial on her right first metatarsal head. 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 She tells [place] she was up on her feet over the Mother's Day celebration. ooThe large wound is on the left foot. Continues with hyperbarics for underlying osteomyelitis. We're using Hydrofera Blue. She asked me today about where we were with Dermagraft. I had actually excluded this because of the co-pay however she wants to assume this therefore I'll recheck the co-pay an order for next week. 08/20/17; the patient agreed to accept the co-pay of the first Apligraf which we applied  today. She is disappointed she is finishing hyperbarics will run this through the insurance on the extent of the foot infection and the extent of the wound that she had however she is already had 60 dive's. Dermagraft No. 1 08/27/17; Dermagraft No. 2. She is not eligible for any more hyperbaric treatments this month. She reports a fair amount of drainage and she actually changed to the external dressings without disturbing the direct contact layer 09/03/17; the patient arrived in clinic today with the wound superficially looking quite healthy. Nice vibrant red tissue with some advancing epithelialization although not as much adherence of the flap as I might like. However she noted on her own fourth toe some bogginess and she brought that to our attention. Indeed this was boggy feeling like a possibility of subcutaneous fluid. 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 She is not been unwell. We've been using Dermagraft 09/10/17; the culture that I did not last week was MRSA. She saw Dr. Megan Salon this morning who is going to start her on vancomycin. I had sent him a secure a text message yesterday. I also spoke with her podiatric surgeon [name] about surgery on this foot the options for conserving a functional foot etc. Promised me he would see her  and will make back consultation today. Paradoxically her actual wound on the plantar aspect of her left foot looks really quite good. I had given her 5 days worth of Baxdella to cover her for MRSA. Her MRI came back showing osteomyelitis within the third metatarsal shaft and head and base of the third and fourth proximal phalanx. She had extensive inflammatory changes throughout the soft tissue of the lateral forefoot. With an ill-defined fluid around the fourth metatarsal extending into the plantar and dorsal soft tissues 09/19/17; the patient is actually on oral Septra and Flagyl. She apparently refused IV vancomycin. She also saw [name] at my request who is planning her for a left BKA sometime in mid July. MRI showed osteomyelitis within the third metatarsal shaft and head and the basis of the third and fourth proximal phalanx. I believe there was felt to be possible septic arthritis involving the third MTP. 09/26/17; the patient went back to Dr. Megan Salon at my suggestion and is now receiving IV daptomycin. Her wound continues to look quite good making the decision to proceed with a transmetatarsal amputation although more difficult for the patient. I believe in my extensive discussions with her she has a good sense of the pros and cons of this. I don't NV the tuft decision she has to make. She has an appointment with [name] I believe in mid July and I previously spoken to him about this issue Has we had used 3 previous Dermagraft. Given the condition of the wound surface I went ahead and added the fourth one today area and I did this not fully realizing that she'll be traveling to [place] next week. I'm hopeful she can come back in 2 weeks 10/21/17; Her same Dermagraft on for about 3-1/2 weeks. In spite of this the wound arrives looking quite healthy. There is been a lot of healing dimensions are smaller. Looking at the square shaped wound she has now there is some undermining and some depth  medially under the undermining although I cannot palpate any bone. No surrounding infection is obvious. She has difficult questions about how to look at this going  forward vis--vis amputations versus continued medical therapy. T be truthful the wound is looks so o healthy and it is continued to contract. Hard to justify foot surgery at this point although I still told her that I think it might come to that if we are not able to eradicate the underlying MRSA. She is still highly at risk and she understands this 11/06/17 on evaluation today patient appears to be doing better in regard to her foot ulcer. She's been tolerating the dressing changes without complication. Currently she is here for her Dermagraft #6. Her wound continues to make excellent progress at this point. She does not appear to have any evidence of infection which is good news. 11/13/17 on evaluation today patient appears to be doing excellent at this time. She is here for repeat Dermagraft application. This is #7. Overall her wound seems to be making great progress. 12/05/17; the patient arrives with the wound in much better condition than when I last saw this almost 6 weeks ago. She still has a small probing area in the left metatarsal head region on the lateral aspect of her foot. We applied her last Dermagraft today. ooSince the last time she is here she has what appears to a been a blood blister on the plantar aspect of left foot although I don't see this is threatening. There is also a thick raised tissue on the right mid metatarsal head region. This was not there I don't think the last time she was here 3 weeks ago. 12/12/17; the patient continues to have a small programming area in the left metatarsal head region on the lateral aspect of her foot which was the initial large surgical wound. I applied her last Apligraf last week. I'm going to use Endoform starting today ooUnfortunately she has an excoriated area in the left mid  foot and the right mid foot. The left midfoot looks like a blistered area this was not opened last week it certainly is open today. Using silver alginate on these areas. She promises me she is offloading this. 12/19/17; the small probing area in the left metatarsal head eyes think is shallower. In general her original wound looks better. We've been using Endoform. The area inferiorly that I think was trauma last week still requires debridement a lot of nonviable surface which I removed. She still has an open open area distally in her foot ooSimilarly on the right foot there is tightly adherent surface debris which I removed. Still areas that don't look completely epithelialized. This is a small open area. We used silver alginate on these areas 12/26/2017; the patient did not have the supplies we ordered from last week including the Endoform. The original large wound on the left lateral foot looks healthy. She still has the undermining area that is largely unchanged from last week. She has the same heavily callused raised edged wounds on the right mid and left midfoot. Both of these requiring debridement. We have been using silver alginate on these areas 01/02/2018; there is still supply issues. We are going to try to use Prisma but I am not sure she actually got it from what she is saying. She has a new open area on the lateral aspect of the left fourth toe [previous fifth ray amputation]. Still the one tunneling area over the fourth metatarsal head. The area is in the midfoot bilaterally still have thick callus around them. She is concerned about a raised swelling on the lateral aspect of the foot. However she  is completely insensate 01/10/2018; we are using Prisma to the wounds on her bilateral feet. Surprisingly the tunneling area over the left fourth metatarsal head that was part of her original surgery has closed down. She has a small open area remaining on the incision line. 2 open areas in the  midfoot. 02/10/2018; the patient arrives back in clinic after a month hiatus. She was traveling to visit family in [place]. Is fairly clear she was not offloading the areas on her feet. The original wound over the left lateral foot at the level of metatarsal heads is reopened and probes medially by about a centimeter or 2. She notes that a week ago she had purulent drainage come out of an area on the left midfoot. Paradoxically the worst area is actually on the right foot is extensive with purulent drainage. We will use silver alginate today 02/17/2018; the patient has 3 wounds one over the left lateral foot. She still has a small area over the metatarsal heads which is the remnant of her original surgical wound. This has medial probing depth of roughly 1.4 cm somewhat better than last week. The area on the right foot is larger. We have been using silver alginate to all areas. The area on the right foot and left foot that we cultured last week showed both Klebsiella and Proteus. Both of these are quinolone sensitive. The patient put her's self on Bactrim and Flagyl that she had left hanging around from prior antibiotic usages. She was apparently on this last week when she arrived. I did not realize this. Unfortunately the Bactrim will not cover either 1 of these organisms. We will send in Cipro 500 twice daily for a week 03/04/2018; the patient has 2 wounds on the left foot one is the original wound which was a surgical wound for a deep DFU. At one point this had exposed bone. She still has an area over the fourth metatarsal head that probes about 1.4 cm although I think this is better than last week. I been using silver nitrate to try and promote tissue adherence and been using silver alginate here. ooShe also has an area in the left midfoot. This has some depth but a small linear wound. Still requiring debridement. ooOn the right midfoot is a circular wound. A lot of thick callus around this  area. ooWe have been using silver alginate to all wound areas ooShe is completed the ciprofloxacin I gave her 2 weeks ago. 03/11/2018; the patient continues to have 2 open areas on the left foot 1 of which was the original surgical wound for a deep DFU. Only a small probing area remains although this is not much different from last week we have been using silver alginate. The other area is on the midfoot this is smaller linear but still with some depth. We have been using silver alginate here as well ooOn the right foot she has a small circular wound in the mid aspect. This is not much smaller than last time. We have been using silver alginate here as well 03/18/2018; she has 3 wounds on the left foot the original surgical wound, a very superficial wound in the mid aspect and then finally the area in the mid plantar foot. She arrives in today with a very concerning area in the wound in the mid plantar foot which is her most proximal wound. There is undermining here of roughly 1-1/2 cm superiorly. Serosanguineous drainage. She tells me she had some pain on for over  the weekend that shot up her foot into her thigh and she tells me that she had a nodule in the groin area. ooShe has the single wound in the right foot. ooWe are using endoform to both wound areas 03/24/2018; the patient arrives with the original surgical wound in the area on the left midfoot about the same as last week. There is a collection of fluid under the surface of the skin extending from the surgical wound towards the midfoot although it does not reach the midfoot wound. The area on the right foot is about the same. Cultures from last week of the left midfoot wound showed abundant Klebsiella abundant Enterococcus faecalis and moderate methicillin resistant staph I gave her Levaquin but this would have only covered the Klebsiella. She will need linezolid 04/01/2018; she is taking linezolid but for the first few days only took  1 a day. I have advised her to finish this at twice daily dosing. In any case all of her wounds are a lot better especially on the left foot. The original surgical wound is closed. The area on the left midfoot considerably smaller. The area on the right foot also smaller. 04/08/2018; her original surgical wound/osteomyelitis on the left foot remains closed. She has area on the left foot that is in the midfoot area but she had some streaking towards this. This is not connected with her original wound at least not visually. ooSmall wound on the right midfoot appears somewhat smaller. 04/15/18; both wounds looks better. Original wound is better left midfoot. Using silver alginate 1/21; patient states she uses saltwater soak in, stones or remove callus from around her wounds. She is also concerned about a blood blister she had on the left foot but it simply resolved on its own. We've been using silver alginate 1/28; the patient arrives today with the same streaking area from her metatarsals laterally [the site of her original surgical wound] down to the middle of her foot. There is some drainage in the subcutaneous area here. This concerns me that there is actually continued ongoing infection in the metatarsals probably the fourth and third. This fixates an MRI of the foot without contrast [chronic renal failure] ooThe wound in the mid part of the foot is small but I wonder whether this area actually connects with the more distal foot. ooThe area on the right midfoot is probably about the same. Callus thick skin around the small wound which I removed with a curette we have been using silver alginate on both wound areas 2/4; culture I did of the draining site on the left foot last time grew methicillin sensitive staph aureus. MRI of the left foot showed interval resolution of the findings surrounding the third metatarsal joint on the prior study consistent with treated osteomyelitis. Chronic soft tissue  ulceration in the plantar and lateral aspect of the forefoot without residual focal fluid collection. No evidence of recurrent osteomyelitis. 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 She tells me she had nausea and vomiting over the weekend and missed 2 days. In general her foot looks much the same. She has a small open area just below the left fourth metatarsal head. A linear area in the left midfoot. Some discoloration extending from the inferior  part of this into the left lateral foot although this appears to be superficial. She has a small area on the right midfoot which generally looks smaller after debridement 2/18; the patient is completing his cephalexin and has another 2 days. She continues to have open areas on the left and right foot. 2/25; she is now off antibiotics. The area on the left foot at the site of her original surgical wound has closed yet again. She still has open areas in the mid part of her foot however these appear smaller. The area on the right mid foot looks about the same. We have been using silver alginate She tells me she had a serious hypoglycemic spell at home. She had to have EMS called and get IV dextrose 3/3; disappointing on the left lateral foot large area of necrotic tissue surrounding the linear area. This appears to track up towards the same original surgical wound. Required extensive debridement. The area on the right plantar foot is not a lot better also using silver 3/12; the culture I did last time showed abundant enterococcus. I have prescribed Augmentin, should cover any unrecognized anaerobes as well. In addition there were a few MRSA and Serratia that would not be well covered although I did not want to give her  multiple antibiotics. She comes in today with a new wound in the right midfoot this is not connected with the original wound over her MTP a lot of thick callus tissue around both wounds but once again she said she is not walking on these areas 3/17-Patient comes in for follow-up on the bilateral plantar wounds, the right midfoot and the left plantar wound. Both these are heavily callused surrounding the wounds. We are continuing to use silver alginate, she is compliant with offloading and states she uses a wheelchair fairly often at home 3/24; both wound areas have thick callus. However things actually look quite a bit better here for the majority of her left foot and the right foot. 3/31; patient continues to have thick callused somewhat irritated looking tissue around the wounds which individually are fairly superficial. There is no evidence of surrounding infection. We have been using silver alginate however I change that to [facility] today 4/17; patient returns to clinic after having a scare with Covid she tested negative in her primary doctor's office. She has been using Hydrofera Blue. She does not have an open area on the right foot. On the left foot she has a small open area with the mid area not completely viable. She showed me pictures of what looks like a hemorrhagic blister from several days ago but that seems to have healed over this was on the lateral left foot 4/21; patient comes in to clinic with both her wounds on her feet closed. However over the weekend she started having pain in her right foot and leg up into the thigh. She felt as though she was running a low-grade fever but did not take her temperature. She took a doxycycline that she had leftover and yesterday a single Septra and metronidazole. She thinks things feel somewhat better. 4/28; duplex ultrasound I ordered last week was negative for DVT or superficial thrombophlebitis. She is completed the doxycycline I gave her.  States she is still having a lot of pain in the right calf and right ankle which is no better than last week. She cannot sleep. She also states she has a temperature of up to 101, coughing and complaining of visual loss in  her bilateral eyes. Apparently she was tested for Covid 2 weeks ago at [facility] and that was negative. Readmission: 09/03/18 patient presents back for reevaluation after having been evaluated at the end of April regarding erythema and swelling of her right lower extremity. Subsequently she ended up going to the hospital on 07/29/18 and was admitted not to be discharged until 08/08/18. Unfortunately it was noted during the time that she was in the hospital that she did have methicillin-resistant Staphylococcus aureus as the infection noted at the site. It was also determined that she did have osteomyelitis which appears to be fairly significant. She was treated with vancomycin and in fact is still on IV vancomycin at dialysis currently. This is actually slated to continue until 09/12/18 at least which will be the completion of the six weeks of therapy. Nonetheless based on what I'm seeing at this point I'm not sure she will be anywhere near ready to discontinue antibiotics at that time. Since she was released from the hospital she was seen by [name] who is her podiatrist on 08/27/18. His note specifically states that he is recommended that the patient needs of one knee amputation on the right as she has a life- threatening situation that can lead quickly to sepsis. The patient advised she would like to try to save her leg to which [name] apparently told her that this was against all medical advice. She also want to discontinue the Wound VAC which had been initiated due to the fact that she wasn't pleased with how the wound was looking and subsequently she wanted to pursue applying Medihoney at that time. He stated that he did not believe that the right lower extremity was salvageable and  that the patient understood but would still like to attempt hyperbaric option therapy if it could be of any benefit. She was therefore referred back to [place] for further evaluation. He plans to see her back next week. Upon inspection today patient has a significant amount purulent drainage noted from the wound at this point. The bone in the distal portion of her foot also appears to be extremely necrotic and spongy. When I push down on the bone it bubbles and seeps purulent drainage from deeper in the end of the foot. I do not think that this is likely going to heal very well at all and less aggressive surgical debridement were undertaken more than what I believe we can likely do here in our office. 09/12/2018; I have not seen this patient since the most recent hospitalization although she was in our clinic last week. I have reviewed some of her records from a complex hospitalization. 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 There is situation was complicated by MRSA bacteremia and acute on chronic renal failure now on dialysis. She is receiving vancomycin at dialysis. We started her on Dakin's wet-to-dry last week she is changing this daily. There is still purulent drainage coming out of her foot. Although she is apparently "agreeable" to a below-knee amputation which is been suggested by multiple clinicians she wants this to be done in [place]. She apparently has a telehealth visit with that provider sometime in late [place] 6/24. I have told her I think this is probably too long. Nevertheless I could not convince her to allow a local doctor to perform BKA. 09/19/2018; the patient has a large necrotic area on the right anterior foot. She has had previous transmetatarsal amputations. Culture I did  last week showed MRSA nothing else she is on vancomycin at dialysis. She has continued leaking purulent drainage out of the distal part of the large circular  wound on the right anterior foot. She apparently went to see Dr. 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 Somehow that translated into her being referred to plastic surgery for debridement of the area. I gather she basically refused amputation although I do not have a copy of Dr. Berenice Primas notes. The patient really wants to have a trial of hyperbaric oxygen. I agreed with initial assessment in this clinic that this was probably too far along to benefit however if she is going to have plastic surgery I think she would benefit from ancillary hyperbaric oxygen. The issue here is that the patient has benefited as maximally as any patient I have ever seen from hyperbaric oxygen therapy. Most recently she had exposed bone on the lateral part of her left foot after a surgical procedure and that actually has closed. She has eschared areas in both heels but no open area. She is remained systemically well. I am not optimistic that anything can be done about this but the patient is very clear that she wants an attempt. The attempt would include a wound VAC further debridements and hyperbaric oxygen along with IV antibiotics. 6/26; I put her in for a trial of hyperbaric oxygen only because of the dramatic response she has had with wounds on her left midfoot earlier this year which was a surgical wound that went straight to her bone over the metatarsal heads and also remotely the left third toe. We will see if we can get this through our review process and insurance. She arrives in clinic with again purulent material pouring out of necrotic bone on the top of the foot distally. There is also some concerning erythema on the front of the leg that we marked. It is bit difficult to tell how tender this is because of neuropathy. I note from infectious disease that she had her vancomycin extended. 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 She had the wound VAC  on for part of the week. The rest of the time she is putting various things on this including Medihoney, "ionized water" silver sorb gel etc. 7/7; follow-up along with HBO. She is still on vancomycin at dialysis. She has a large open area on the dorsal right foot and a small dark eschar area on her heel. There is a lot less erythema in the area and a lot less tenderness. From an infection point of view I think this is better. She still has a lot of necrosis in the remaining right forefoot [previous TMA] we are still using the wound VAC in this area 7/16; follow-up along with HBO. I put her on linezolid after she finished her vancomycin. We started this last Friday I gave her 2 weeks worth. I had the expectation that she would be operatively debrided by Dr. Marla Roe but that still has not happened yet. Patient phoned the office this week. She arrives for review today after HBO. The distal part of this wound is completely necrotic. Nonviable pieces of tendon bone was still purulent drainage. Also concerning that she has black eschar over the heel that is expanding. I think this may be indicative of infection in this area as well. She has less erythema and warmth in the ankle and calf but still an abnormal exam 7/21 follow-up along with HBO.  I will renew her linezolid after checking a CBC with differential monitoring her blood counts especially her platelets. She was supposed to have surgery yesterday but if I am reading things correctly this was canceled after her blood sugar was found to be over 500. I thought Dr. Marla Roe who called me said that they were sending her to the ER but the patient states that was not the case. 7/28. Follow-up along with HBO. She is on linezolid I still do not have any lab work from dialysis even though I called last week. The patient is concerned about an area on her left lateral foot about the level of the base of her fifth metatarsal. I did not really see anything  that ominous here however this patient is in [place] ability to point out problems that she is sensing and she has been accurate in the past Finally she received a call from Dr. Marla Roe who is referring her to another orthopedic surgeon stating that she is too booked up to take her to the operating room now. Was still using a wound VAC on the foot 8/3 -Follow-up after HBO, she is got another week of linezolid, she is to call [facility] for an appointment, x-rays of both feet were reviewed, the left foot x-ray with third MTP joint osteo- Right foot x-ray widespread osteo-in the right midfoot Right ankle x-ray does not show any active evidence of infection 8/11-Patient is seen after HBO, the wounds on the right foot appear to be about the same, the heel wound had some necrotic base over tendon that was debrided with a curette 8/21; patient is seen after HBO. The patient's wound on her dorsal foot actually looks reasonably good and there is substantial amount of epithelialization however the open area distally still has a lot of necrotic debris partially bone. I cannot really get a good sense of just how deep this probes under the foot. She has been pressuring me this week to order medical maggots through a company in [place] for her. The problem I have is there is not a defined wound area here. On the positive side there is no purulence. She has been to see infectious disease she is still on Septra DS although I have not had a chance to review their notes 8/28; patient is seen in conjunction with HBO. The wounds on her foot continued to improve including the right dorsal foot substantially the, the distal part of this wound and the area on the right heel. We have been using a wound VAC over this chronically. She is still on trimethoprim as directed by infectious disease 9/4; patient is seen in conjunction with HBO. Right dorsal foot wound substantially anteriorly is better however she continues to have a  deep wound in the distal part of this that is not responding. We have been using silver collagen under border foam ooArea on the right plantar medial heel seems better. We have been using Hydrofera Blue 12/12/18 on evaluation today patient appears to be doing about the same with regard to her wound based on prior measurements. She does have some necrotic tissue noted on the lateral aspect of the wound that is going require a little bit of sharp debridement today. This includes what appears to be potentially either severely necrotic bone or tendon. Nonetheless other than that she does not appear to have any severe infection which is good news 9/18; it is been 2 weeks since I saw this wound. She is tolerating HBO well. Continued  dramatic improvement in the area on the right dorsal foot. She still has a small wound on the heel that we have been using Hydrofera Blue. She continues with a wound VAC 9/24; patient has to be seen emergently today with a swelling on her right lateral lower leg. She says that she told Dr. Evette Doffing about this and also myself on a couple of occasions but I really have no recollection of this. She is not systemically unwell and her wound really looked good the last time I saw this. She showed this to providers at dialysis and she was able to verify that she was started on cephalexin today for 5 doses at dialysis. She dialyzes on Tuesday Thursday and Saturday. 10/2; patient is seen in conjunction with HBO. The area that is draining on the right anterior medial tibia is more extensive. Copious amounts of serosanguineous drainage with some purulence. We are still using the wound VAC on the original wound then it is stable. Culture I did of the original IandD showed MRSA I contacted dialysis she is now on vancomycin with dialysis treatments. I asked them to run a month 10/9; patient seen in conjunction with HBO. She had a new spontaneous open area just above the wound on the right  medial tibia ankle. More swelling on the right medial tibia. Her wound on the foot looks about the same perhaps slightly better. There is no warmth spreading up her leg but no obvious erythema. her MRI of the foot and ankle and distal tib-fib is not booked for next Friday I discussed this with her in great detail over multiple days. it is likely she has spreading infection upper leg at least involving the distal 25% above the ankle. She knows that if I refer her to orthopedics for infectious disease they are going to recommend amputation and indeed I am not against this myself. We had a good trial at trying to heal the foot which is what she wanted along with antibiotics debridement and HBO however she clearly has spreading infection [probably staph aureus/MRSA]. Nevertheless she once again tells me she wants to wait the left of the MRI. She still makes comments about having her amputation done in [place]. 10/19; arrives today with significant swelling on the lateral right leg. Last culture I did showed Klebsiella. Multidrug-resistant. Cipro was intermediate sensitivity and that is what I have her on pending her MRI which apparently is going to be done on Thursday this week although this seems to be moving back and forth. She is not systemically unwell. We are using silver alginate on her major wound area on the right medial foot and the draining areas on the right lateral lower leg 10/26; MRI showed extensive abscess in the anterior compartment of the right leg also widespread osteomyelitis involving osseous structures of the midfoot and portions of the hindfoot. Also suspicion for osteomyelitis anterior aspect of the distal medial malleolus. Culture I did of the purulence once again showed a multidrug-resistant Klebsiella. I have been in contact with nephrology late last week and she has been started on cefepime at dialysis to replace the vancomycin We sent a copy of her MRI report to Dr. Geroge Baseman  in [place] who is an orthopedic surgeon. The patient takes great stock in his opinion on this. She says she will go to [place] to have her leg amputated if Dr. Geroge Baseman does not feel there is any salvage options. 11/2; she still is not talk to her orthopedic surgeon in [place]. Apparently  he will call her at [phone] this afternoon. The quality of this is she has not allowed me to refer her anywhere. She has been told over and over that she needs this amputated but has not agreed to be referred. She tells me her blood sugar was 600 last night but she has not been febrile. 11/9; she never did got a call from the orthopedic surgeon in [place] therefore that is off the radar. We have arranged to get her see orthopedic surgery at [facility]. She still has a lot of draining purulence coming out of the new abscess in her right leg although that probably came from the osteomyelitis in her right foot and heel. Meanwhile the original wound on the right foot looks very healthy. Continued improvement. The issue is that the last MRI showed osteomyelitis in her right foot extensively she now has an abscess in the right anterior lower leg. There is nobody in [place] who will offer this woman anything but an amputation and to be honest that is probably what she needs. I think she still wants to talk about limb salvage although at this point I just do not see that. She has completed her vancomycin at dialysis which was for the original staph aureus she is still on cefepime for the more recent Klebsiella. She has had a long course of both of these antibiotics which should have benefited the osteomyelitis on the right foot as well as the abscess. 11/16; apparently [place] elective surgery is shut down because of COVID-19 pandemic. I have reached out to some contacts at [facility] to see if we can get her an orthopedic appointment there. I am concerned about continually leaving this but for the moment  everything is static. In fact her original large wound on this foot is closing down. It is the abscess on the right anterior leg that continues to drain purulent serosanguineous material. She is not currently on any antibiotics however she had a prolonged course of vancomycin [1 month] as well as cefepime for a month 02/24/2019 on evaluation today patient appears to be doing better than the last time I saw her. This is not a patient that I typically see. With that being said I am covering for Dr. Dellia Nims this week and again compared to when I last saw her overall the wounds in particular seem to be doing significantly better which is good news. With that being said the patient tells me several disconcerting things. She has not been able to get in to see anyone for potential debridement in regard to her leg wounds although she tells me that she does not think it is necessary any longer because she is taking care of that herself. She noticed a string coming out of the lower wound on her leg over the last week. The patient states that she subsequently decided that we must of pack something in there and started pulling the string out and as it kept coming and coming she realized this was likely her tendon. With that being said she continued to remove as much of this as she could. She then I subsequently proceeded to using tubes of antibiotic ointment which she will stick down into the wound and then scored as much as she can until she sees it coming out of the other wound opening. She states that in doing this she is actually made things better and there is less redness and irritation. With regard to her foot wound she does have some necrotic tendon  and tissue noted in one small corner but again the actual wound itself seems to be doing better with good granulation in general compared to my last evaluation. 12/7; continued improvement in the wound on the substantial part of the right medial foot. Still a  necrotic area inferiorly that required debridement but the rest of this looks very healthy and is contracting. She has 2 wounds on the right lateral leg which were her original drainage sites from her abscess but all of this looks a lot better as well. She has been using silver alginate after putting antibiotic biotic ointment in one wound and watching it come out the other. I have talked to her in some detail today. I had given her names of orthopedic surgeons at [facility] for second opinion on what to do about the right leg. I do not think the patient never called them. She has not been able to get a hold of the orthopedic surgeon in [place] that she had put a lot of faith in as being somebody would give her an opinion that she would trust. I talked to her today and said even if I could get her in to another orthopedic surgeon about the leg which she accept an amputation and she said she would not therefore I am not going to press this issue for the moment 12/14; continued improvement in his substantial wound on the right medial foot. There is still a necrotic area inferiorly with tightly adherent necrotic debris which I have been working on debriding each time she is here. She does not have an orthopedic appointment. Since last time she was here I looked over her cultures which were essentially MRSA on the foot wound and gram-negative rods in the abscess on the anterior leg. 12/21; continued improvement in the area on the right medial foot. She is not up on this much and that is probably a good thing since I do not know it could support continuous ambulation. She has a small area on the right lateral leg which were remanence of the IandD's I did because of the abscess. I think she should probably have prophylactic antibiotics I am going to have to look this over to see if we can make an intelligent decision here. In the meantime her major wound is come down nicely. Necrotic area inferiorly is  still there but looks a lot better 04/06/2019; she has had some improvement in the overall surface area on the right medial foot somewhat narrowedr both but somewhat longer. The areas on the right lateral leg which were initial IandD sites are superficial. Nothing is present on the right heel. We are using silver alginate to the wound areas 1/18; right medial foot somewhat smaller. Still a deep probing area in the most distal recess of the wound. She has nothing open on the right leg. She has a new wound on the plantar aspect of her left fourth toe which may have come from just pulling skin. The patient using Medihoney on the wound on her foot under silver alginate. I cannot discourage her from this 2/1; 2-week follow-up using silver alginate on the right foot and her left fourth toe. The area on the right dorsal foot is contracted although there is still the deep area in the most distal part of the wound but still has some probing depth. No overt infection 2/15; 2-week follow-up. She continues to have improvement in the surface area on the dorsal right foot. Even the tunneling area from last  time is almost closed. The area that was on the plantar part of her left fourth toe over the PIP is indeed closed 3/1; 2-week follow-up. Continued improvement in surface area. The original divot that we have been debriding inferiorly I think has full epithelialization although the epithelialization is gone down into the wound with probably 4 mm of depth. Even under intense illumination I am unable to see anything open here. The remanence of the wound in this area actually look quite healthy. We have been using silver alginate 3/15; 2-week follow-up. Unfortunately not as good today. She has a comma shaped wound on the dorsal foot however the upper part of this is larger. Under illumination debris on the surface She also tells [place] that she was on her right leg 2 times in the last couple of weeks mostly to reach up for  things above her head etc. She felt a sharp pain in the right leg which she thinks is somewhere from the ankle to the knee. The patient has neuropathy and is really uncertain. She cannot feel her foot so she does not think it was coming from there 3/29; 2-week follow-up. Her wound measures smaller. Surface of the wound appears reasonable. She is using silver alginate with underlying Medihoney. She has home health. X-rays I did of her tib-fib last time were negative although it did show arterial calcification 4/12; 2-week follow-up. Her wound measures smaller in length. Using manuka honey with silver alginate on top. She has home health. 4/26; 2-week follow-up. Her wound is smaller but still very adherent debris under illumination requiring debridement she has been using manuka honey with silver alginate. 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 Been using Hydrofera Blue. She is on suppressive Bactrim but apparently she has been taking it twice daily 6/7; I have not seen this wound and about 6 weeks. Since then she was up in [place]. By her own admission she was walking on the foot because she did not have a wheelchair. The wound is not nearly as healthy looking as it was the last time I saw this. We ordered different things for her but she only uses Medihoney and silver alginate. As far as I know she is on suppressive trimethoprim sulfamethoxazole. She does not admit to any fever or chills. Her CBGs apparently are at baseline however she is saying that she feels some discomfort on the lateral part of her ankle I looked over her last inflammatory markers from the summer 2020 at which time she had a deeply necrotic infected wound in this area. On 11/10/2018 her sedimentation rate was 56 and C-reactive protein 9.9. This was 107 and 29 on 07/29/2018. 6/17; the patient had a necrotic wound the last  time she was here on the right dorsal foot. After debridement I did a culture. This showed a very resistant ESBL Klebsiella as well as Enterococcus. Her x-ray of the foot which was done because of warmth and some discomfort showed bone destruction within the carpal bones involving the navicular acute cuboid lateral middle cuneiforms but essentially unchanged from her prior study which was done on 10/29/2018. The findings were felt to represent chronic osteomyelitis. We did inflammatory markers on her. Her white count was 5.25 sedimentation rate 16 and C-reactive protein at 11.1. Notable for the fact that in August 2020 her CRP was 9.9 and sedimentation rate 56. I have  looked at her x-rays. It is true that the bone destruction is very impressive however the patient came into this clinic for the wound on her right foot with pieces of bone literally falling out anteriorly with purulent material. I am not exactly sure I could have expected anything different. She has not been systemically unwell no fever chills or blood sugars have been reasonable. 6/28; she arrives with a right heel closed. The substantial area on the right anterior foot looks healthy. Much better looking surface. I think we can change to [facility] seems to help this previously. She is getting her antibiotics at dialysis she should be just about finished 7/9; changed to [facility] last week. Surface wound looks satisfactory not much change in surface area however. She is going to [place] state next week this is usually a difficult thing for this patient follow-up will be for 2 weeks. 7/23; using Hydrofera Blue. She returns from her trip and the wound looks surprisingly good. Usually when this patient goes on trips she comes back with a lot of problems with the wound. She is saying that she sometimes feels an episodic "crunching" feeling on the lateral part of the foot. She is neuropathic and not feeling pain but wonders whether  this could be a neuropathic dysesthesia. 11/13/19-Patient returns after 3 weeks, the wound itself is stable and patient states that there is nothing new going on she is on some extra anxiety medications and is resisting the temptation to pick at the dry skin around the wound. 9/20; patient has not been here in over a month and I have not seen her in 2 months. The wound in terms of size I think is about the same. There is no exposed bone. She has a nonviable surface on this. She is supposed to be using [facility] however she is also been using some form of honey preparation as well as a silver-based dressings. I do not think she has any pattern to this. 10/4; 2-week follow-up. Patient has been using some form of spray which she says has honey and silver to purchase this online she has been covering it with gauze. In spite of this the wound actually looks quite good. The deeper divot distally appears to be close down. There is a rim of epithelialization. 10/18; 2-week follow-up. Patient has been using her Hydrofera Blue covered with her silver honey spray that she got online. Objective Constitutional Patient is hypertensive.. Pulse regular and within target range for patient.Marland Kitchen Respirations regular, non-labored and within target range.. Temperature is normal and within the target range for the patient.Marland Kitchen Appears in no distress. Vitals Time Taken: 1:00 PM, Height: 67 in, Weight: 125 lbs, BMI: 19.6, Temperature: 98.1 F, Pulse: 76 bpm, Respiratory Rate: 16 breaths/min, Blood Pressure: 177/82 mmHg. Cardiovascular Pedal pulses are palpable. General Notes: Wound exam; the patient has a an expanding area of epithelialization below the wound. This is definitely measuring smaller. 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 Integumentary (Hair, Skin) Wound #43 status is Open. Original cause of wound was Gradually Appeared. The wound  is located on the Right,Medial Foot. The wound measures 3.8cm length x 2cm width x 0.1cm depth; 5.969cm^2 area and 0.597cm^3 volume. There is Fat Layer (Subcutaneous Tissue) exposed. There is no tunneling or undermining noted. There is a medium amount of purulent drainage noted. The wound margin is flat and intact. There is medium (34-66%) pink granulation within  the wound bed. There is a medium (34-66%) amount of necrotic tissue within the wound bed including Adherent Slough. 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 Dressing Change Frequency: Wound #43 Right,Medial Foot: Change Dressing every other day. Wound Cleansing: Wound #43 Right,Medial Foot: Clean wound with Wound Cleanser - or normal saline Primary Wound Dressing: Wound #43 Right,Medial Foot: Hydrofera Blue Secondary Dressing: Wound #43 Right,Medial Foot: Kerlix/Rolled Gauze Dry Gauze ABD pad Other: - ACE wrap Edema Control: Elevate legs to the level of the heart or above for 30 minutes daily and/or when sitting, a frequency of: - throughout the day Home Health: [facility] for wound care. - Interim 1. I have continued with the Hydrofera Blue to her major wound. Silver alginate in the distal divot which only has a small open area. ooShe has what appears to be a plantar wart on her right heel. Callus on the tip of her first MTP neither 1 of these looks on the Electronic Signature(s) Signed: 01/19/2020 4:22:08 PM By: Linton Ham MD Entered By: Linton Ham on 01/18/2020 13:29:18 -------------------------------------------------------------------------------- SuperBill Details Patient Name:  Date of Service: [name] 01/18/2020 Medical Record Number: [id number] Patient Account Number: [id number] Date of Birth/Sex: Treating RN: 1971/01/18 (49 y.o. Nancy Fetter Primary Care Provider: [name], [name] Other Clinician: Referring Provider: Treating Provider/Extender: [name], [name] Weeks in Treatment: 30 Diagnosis Coding ICD-10 Codes Code Description [phone] Other chronic osteomyelitis, right ankle and foot L97.514 Non-pressure chronic ulcer of other part of right foot with necrosis of bone L97.411 Non-pressure chronic ulcer of right heel and midfoot limited to breakdown of skin E10.621 Type 1 diabetes mellitus with foot ulcer Facility Procedures CPT4 Code: [id number] Description: 99213 - WOUND CARE VISIT-LEV 3 EST PT Modifier: Quantity: 1 Physician Procedures : CPT4 Code Description Modifier [id number] - WC PHYS LEVEL 3 - EST PT ICD-10 Diagnosis Description L97.514 Non-pressure chronic ulcer of other part of right foot with necrosis of bone L97.411 Non-pressure chronic ulcer of right heel and midfoot  limited to breakdown of skin Quantity: 1 Electronic Signature(s) Signed: 01/18/2020 5:42:40 PM By: [name] RN, BSN Signed: 01/19/2020 4:22:08 PM By: Linton Ham MD Entered By: [name] on 01/18/2020 13:37:18

## 2020-01-19 NOTE — Progress Notes (Signed)
[name], [name] ([id number]) Visit Report for 01/18/2020 Arrival Information Details Patient Name: Date of Service: Nancy LLO [name] 01/18/2020 12:30 PM Medical Record Number: [id number] Patient Account Number: [id number] Date of Birth/Sex: Treating RN: 12/13/1970 (49 y.o. [name], [name] Primary Care [name]: [name], [name] Other Clinician: Referring Jamar Casagrande: Treating Ruchi Stoney/Extender: [name], [name] Weeks in Treatment: 25 Visit Information History Since Last Visit Added or deleted any medications: No Patient Arrived: Wheel Chair Any new allergies or adverse reactions: No Arrival Time: 13:00 Had a fall or experienced change in No Accompanied By: self activities of daily living that may affect Transfer Assistance: None risk of falls: Patient Identification Verified: Yes Signs or symptoms of abuse/neglect since last visito No Secondary Verification Process Completed: Yes Hospitalized since last visit: No Patient Requires Transmission-Based Precautions: No Implantable device outside of the clinic excluding No Patient Has Alerts: No cellular tissue based products placed in the center since last visit: Has Dressing in Place as Prescribed: Yes Pain Present Now: No Electronic Signature(s) Signed: 01/18/2020 5:10:30 PM By: Deon Pilling Entered By: Deon Pilling on 01/18/2020 13:09:21 -------------------------------------------------------------------------------- Clinic Level of Care Assessment Details Patient Name: Date of Service: [name] [name] 01/18/2020 12:30 PM Medical Record Number: [id number] Patient Account Number: [id number] Date of Birth/Sex: Treating RN: October 21, 1970 (49 y.o. Nancy Fetter Primary Care Ivylynn Hoppes: [name], [name] Other Clinician: Referring [name]: Treating [name]/Extender: [name], [name] Weeks in Treatment: 70 Clinic Level of Care Assessment Items TOOL 4 Quantity Score X- 1  0 Use when only an EandM is performed on FOLLOW-UP visit ASSESSMENTS - Nursing Assessment / Reassessment X- 1 10 Reassessment of Co-morbidities (includes updates in patient status) X- 1 5 Reassessment of Adherence to Treatment Plan ASSESSMENTS - Wound and Skin A ssessment / Reassessment X - Simple Wound Assessment / Reassessment - one wound 1 5 []  - 0 Complex Wound Assessment / Reassessment - multiple wounds []  - 0 Dermatologic / Skin Assessment (not related to wound area) ASSESSMENTS - Focused Assessment []  - 0 Circumferential Edema Measurements - multi extremities []  - 0 Nutritional Assessment / Counseling / Intervention X- 1 5 Lower Extremity Assessment (monofilament, tuning fork, pulses) []  - 0 Peripheral Arterial Disease Assessment (using hand held doppler) ASSESSMENTS - Ostomy and/or Continence Assessment and Care []  - 0 Incontinence Assessment and Management []  - 0 Ostomy Care Assessment and Management (repouching, etc.) PROCESS - Coordination of Care X - Simple Patient / Family Education for ongoing care 1 15 []  - 0 Complex (extensive) Patient / Family Education for ongoing care X- 1 10 Staff obtains Programmer, systems, Records, T Results / Process Orders est X- 1 10 Staff telephones HHA, Nursing Homes / Clarify orders / etc []  - 0 Routine Transfer to another Facility (non-emergent condition) []  - 0 Routine Hospital Admission (non-emergent condition) []  - 0 New Admissions / Biomedical engineer / Ordering NPWT Apligraf, etc. , []  - 0 Emergency Hospital Admission (emergent condition) X- 1 10 Simple Discharge Coordination []  - 0 Complex (extensive) Discharge Coordination PROCESS - Special Needs []  - 0 Pediatric / Minor Patient Management []  - 0 Isolation Patient Management []  - 0 Hearing / Language / Visual special needs []  - 0 Assessment of Community assistance (transportation, D/C planning, etc.) []  - 0 Additional assistance / Altered mentation []  -  0 Support Surface(s) Assessment (bed, cushion, seat, etc.) INTERVENTIONS - Wound Cleansing / Measurement X - Simple Wound Cleansing -  one wound 1 5 []  - 0 Complex Wound Cleansing - multiple wounds X- 1 5 Wound Imaging (photographs - any number of wounds) []  - 0 Wound Tracing (instead of photographs) X- 1 5 Simple Wound Measurement - one wound []  - 0 Complex Wound Measurement - multiple wounds INTERVENTIONS - Wound Dressings []  - 0 Small Wound Dressing one or multiple wounds X- 1 15 Medium Wound Dressing one or multiple wounds []  - 0 Large Wound Dressing one or multiple wounds X- 1 5 Application of Medications - topical []  - 0 Application of Medications - injection INTERVENTIONS - Miscellaneous []  - 0 External ear exam []  - 0 Specimen Collection (cultures, biopsies, blood, body fluids, etc.) []  - 0 Specimen(s) / Culture(s) sent or taken to Lab for analysis []  - 0 Patient Transfer (multiple staff / Civil Service fast streamer / Similar devices) []  - 0 Simple Staple / Suture removal (25 or less) []  - 0 Complex Staple / Suture removal (26 or more) []  - 0 Hypo / Hyperglycemic Management (close monitor of Blood Glucose) []  - 0 Ankle / Brachial Index (ABI) - do not check if billed separately X- 1 5 Vital Signs Has the patient been seen at the hospital within the last three years: Yes Total Score: 110 Level Of Care: New/Established - Level 3 Electronic Signature(s) Signed: 01/18/2020 5:42:40 PM By: [name] RN, BSN Entered By: [name] on 01/18/2020 13:37:02 -------------------------------------------------------------------------------- Encounter Discharge Information Details Patient Name: Date of Service: Nancy LLO [name] 01/18/2020 12:30 PM Medical Record Number: [id number] Patient Account Number: [id number] Date of Birth/Sex: Treating RN: 26-Feb-1971 (49 y.o. [name] Primary Care Quashaun Lazalde: [name], [name] Other Clinician: Referring Martina Brodbeck: Treating  [name]/Extender: [name], [name] Weeks in Treatment: 1 Encounter Discharge Information Items Discharge Condition: Stable Ambulatory Status: Wheelchair Discharge Destination: Home Transportation: Private Auto Accompanied By: self Schedule Follow-up Appointment: Yes Clinical Summary of Care: Electronic Signature(s) Signed: 01/18/2020 5:10:30 PM By: Deon Pilling Entered By: Deon Pilling on 01/18/2020 13:35:35 -------------------------------------------------------------------------------- Lower Extremity Assessment Details Patient Name: Date of Service: [name] [name] 01/18/2020 12:30 PM Medical Record Number: [id number] Patient Account Number: [id number] Date of Birth/Sex: Treating RN: [birth year] (49 y.o. [name] Primary Care Myrian Botello: [name], [name] Other Clinician: Referring Arjan Strohm: Treating Shona Pardo/Extender: [name], [name] Weeks in Treatment: 71 Edema Assessment Assessed: [Left: No] [Right: Yes] Edema: [Left: N] [Right: o] Calf Left: Right: Point of Measurement: From Medial Instep 26.5 cm Ankle Left: Right: Point of Measurement: From Medial Instep 18 cm Vascular Assessment Pulses: Dorsalis Pedis Palpable: [Right:Yes] Electronic Signature(s) Signed: 01/18/2020 5:10:30 PM By: Deon Pilling Entered By: Deon Pilling on 01/18/2020 13:10:08 -------------------------------------------------------------------------------- Multi Wound Chart Details Patient Name: Date of Service: Nancy LLO [name] 01/18/2020 12:30 PM Medical Record Number: [id number] Patient Account Number: [id number] Date of Birth/Sex: Treating RN: [birth year] (49 y.o. Nancy Fetter Primary Care Chandani Rogowski: [name], [name] Other Clinician: Referring Atley Scarboro: Treating Shaneece Stockburger/Extender: [name], [name] Weeks in Treatment: 72 Vital Signs Height(in): 74 Pulse(bpm): 67 Weight(lbs): 125 Blood Pressure(mmHg):  177/82 Body Mass Index(BMI): 20 Temperature(F): 98.1 Respiratory Rate(breaths/min): 16 Photos: [43:No Photos Right, Medial Foot] [N/A:N/A N/A] Wound Location: [43:Gradually Appeared] [N/A:N/A] Wounding Event: [43:Diabetic Wound/Ulcer of the Lower] [N/A:N/A] Primary Etiology: [43:Extremity Cataracts, Chronic sinus] [N/A:N/A] Comorbid History: [43:problems/congestion, Anemia, Sleep Apnea, Deep Vein Thrombosis, Hypertension, Peripheral Arterial Disease, Type I Diabetes, Osteoarthritis, Osteomyelitis, Neuropathy, Seizure Disorder 08/04/2018] [  N/A:N/A] Date Acquired: [43:71] [N/A:N/A] Weeks of Treatment: [43:Open] [N/A:N/A] Wound Status: [43:Yes] [N/A:N/A] Clustered Wound: [43:3.8x2x0.1] [N/A:N/A] Measurements L x W x D (cm) [43:5.969] [N/A:N/A] A (cm) : rea [60:4.540] [N/A:N/A] Volume (cm) : [43:91.80%] [N/A:N/A] % Reduction in A rea: [43:97.30%] [N/A:N/A] % Reduction in Volume: [43:Grade 4] [N/A:N/A] Classification: [43:Medium] [N/A:N/A] Exudate A mount: [43:Purulent] [N/A:N/A] Exudate Type: [43:yellow, brown, green] [N/A:N/A] Exudate Color: [43:Flat and Intact] [N/A:N/A] Wound Margin: [43:Medium (34-66%)] [N/A:N/A] Granulation A mount: [43:Pink] [N/A:N/A] Granulation Quality: [43:Medium (34-66%)] [N/A:N/A] Necrotic A mount: [43:Fat Layer (Subcutaneous Tissue): Yes N/A] Exposed Structures: [43:Fascia: No Tendon: No Muscle: No Joint: No Bone: No Small (1-33%)] [N/A:N/A] Treatment Notes Electronic Signature(s) Signed: 01/18/2020 5:42:40 PM By: [name] RN, BSN Signed: 01/19/2020 4:22:08 PM By: Linton Ham MD Signed: 01/19/2020 4:22:08 PM By: Linton Ham MD Entered By: Linton Ham on 01/18/2020 13:26:30 -------------------------------------------------------------------------------- Multi-Disciplinary Care Plan Details Patient Name: Date of Service: Nancy LLO [address] [name] 01/18/2020 12:30 PM Medical Record Number: [id number] Patient Account Number:  [id number] Date of Birth/Sex: Treating RN: Jul 22, 1970 (49 y.o. Nancy Fetter Primary Care Latoyia Tecson: [name], [name] Other Clinician: Referring Alper Guilmette: Treating Uziah Sorter/Extender: [name], [name] Weeks in Treatment: 33 Active Inactive Wound/Skin Impairment Nursing Diagnoses: Impaired tissue integrity Knowledge deficit related to ulceration/compromised skin integrity Goals: Patient/caregiver will verbalize understanding of skin care regimen Date Initiated: 09/03/2018 Target Resolution Date: 02/19/2020 Goal Status: Active Ulcer/skin breakdown will have a volume reduction of 30% by week 4 Date Initiated: 09/03/2018 Date Inactivated: 10/07/2018 Target Resolution Date: 10/01/2018 Goal Status: Unmet Unmet Reason: COMORBITIES Ulcer/skin breakdown will have a volume reduction of 50% by week 8 Date Initiated: 10/07/2018 Date Inactivated: 11/03/2018 Target Resolution Date: 10/31/2018 Goal Status: Unmet Unmet Reason: Osteomyelitis Interventions: Assess patient/caregiver ability to obtain necessary supplies Assess patient/caregiver ability to perform ulcer/skin care regimen upon admission and as needed Assess ulceration(s) every visit Provide education on ulcer and skin care Treatment Activities: Skin care regimen initiated : 09/03/2018 Topical wound management initiated : 09/03/2018 Notes: Electronic Signature(s) Signed: 01/18/2020 5:42:40 PM By: [name] RN, BSN Entered By: [name] on 01/18/2020 13:09:10 -------------------------------------------------------------------------------- Pain Assessment Details Patient Name: Date of Service: [name] [name] 01/18/2020 12:30 PM Medical Record Number: [id number] Patient Account Number: [id number] Date of Birth/Sex: Treating RN: September 03, 1970 (49 y.o. [name] Primary Care Jahn Franchini: [name], [name] Other Clinician: Referring Christian Borgerding: Treating Neyla Gauntt/Extender: [name], NIA  LL Weeks in Treatment: 73 Active Problems Location of Pain Severity and Description of Pain Patient Has Paino No Site Locations Rate the pain. Current Pain Level: 0 Pain Management and Medication Current Pain Management: Medication: No Cold Application: No Rest: No Massage: No Activity: No T.E.N.S.: No Heat Application: No Leg drop or elevation: No Is the Current Pain Management Adequate: Adequate How does your wound impact your activities of daily livingo Sleep: No Bathing: No Appetite: No Relationship With Others: No Bladder Continence: No Emotions: No Bowel Continence: No Work: No Toileting: No Drive: No Dressing: No Hobbies: No Electronic Signature(s) Signed: 01/18/2020 5:10:30 PM By: Deon Pilling Entered By: Deon Pilling on 01/18/2020 13:09:55 -------------------------------------------------------------------------------- Patient/Caregiver Education Details Patient Name: Date of Service: Nancy LLO [name] 10/18/2021andnbsp12:30 PM Medical Record Number: [id number] Patient Account Number: [id number] Date of Birth/Gender: Treating RN: January 01, 1971 (49 y.o. Nancy Fetter Primary Care Physician: [name], [name] Other Clinician: Referring Physician: Treating Physician/Extender: [name], [name] Weeks in Treatment: 12  Education Assessment Education Provided To: Patient Education Topics Provided Wound/Skin Impairment: Methods: Explain/Verbal Responses: State content correctly Electronic Signature(s) Signed: 01/18/2020 5:42:40 PM By: [name] RN, BSN Entered By: [name] on 01/18/2020 13:09:31 -------------------------------------------------------------------------------- Wound Assessment Details Patient Name: Date of Service: Nancy LLO [name] 01/18/2020 12:30 PM Medical Record Number: [id number] Patient Account Number: [id number] Date of Birth/Sex: Treating RN: [birth year] (49 y.o. [name], [name] Primary  Care Arian Mcquitty: [name], [name] Other Clinician: Referring Hensley Treat: Treating Parris Cudworth/Extender: [name], [name] Weeks in Treatment: 52 Wound Status Wound Number: [id number] Primary Diabetic Wound/Ulcer of the Lower Extremity Etiology: Wound Location: Right, Medial Foot Wound Open Wounding Event: Gradually Appeared Status: Date Acquired: 08/04/2018 Comorbid Cataracts, Chronic sinus problems/congestion, Anemia, Sleep Weeks Of Treatment: 71 History: Apnea, Deep Vein Thrombosis, Hypertension, Peripheral Arterial Clustered Wound: Yes Disease, Type I Diabetes, Osteoarthritis, Osteomyelitis, Neuropathy, Seizure Disorder Photos Photo Uploaded By: [name] on 01/19/2020 13:17:35 Wound Measurements Length: (cm) 3.8 Width: (cm) 2 Depth: (cm) 0.1 Area: (cm) 5.969 Volume: (cm) 0.597 % Reduction in Area: 91.8% % Reduction in Volume: 97.3% Epithelialization: Small (1-33%) Tunneling: No Undermining: No Wound Description Classification: Grade 4 Wound Margin: Flat and Intact Exudate Amount: Medium Exudate Type: Purulent Exudate Color: yellow, brown, green Foul Odor After Cleansing: No Slough/Fibrino Yes Wound Bed Granulation Amount: Medium (34-66%) Exposed Structure Granulation Quality: Pink Fascia Exposed: No Necrotic Amount: Medium (34-66%) Fat Layer (Subcutaneous Tissue) Exposed: Yes Necrotic Quality: Adherent Slough Tendon Exposed: No Muscle Exposed: No Joint Exposed: No Bone Exposed: No Treatment Notes Wound #43 (Right, Medial Foot) 1. Cleanse With Wound Cleanser 3. Primary Dressing Applied Hydrofera Blue 4. Secondary Dressing Dry Gauze Notes ace wrap as secondary. Electronic Signature(s) Signed: 01/18/2020 5:10:30 PM By: Deon Pilling Entered By: Deon Pilling on 01/18/2020 13:11:22 -------------------------------------------------------------------------------- Vitals Details Patient Name: Date of Service: Nancy LLO [place] [name] 01/18/2020  12:30 PM Medical Record Number: [id number] Patient Account Number: [id number] Date of Birth/Sex: Treating RN: November 18, 1970 (49 y.o. [name], [name] Primary Care Chimene Salo: [name], [name] Other Clinician: Referring Meridith Romick: Treating Shanele Nissan/Extender: [name], [name] Weeks in Treatment: 30 Vital Signs Time Taken: 13:00 Temperature (F): 98.1 Height (in): 67 Pulse (bpm): 76 Weight (lbs): 125 Respiratory Rate (breaths/min): 16 Body Mass Index (BMI): 19.6 Blood Pressure (mmHg): 177/82 Reference Range: 80 - 120 mg / dl Electronic Signature(s) Signed: 01/18/2020 5:10:30 PM By: Deon Pilling Entered By: Deon Pilling on 01/18/2020 13:09:45

## 2020-01-20 ENCOUNTER — Encounter (HOSPITAL_COMMUNITY): Payer: Self-pay | Admitting: Vascular Surgery

## 2020-01-20 ENCOUNTER — Other Ambulatory Visit: Payer: Self-pay

## 2020-01-20 ENCOUNTER — Encounter (HOSPITAL_COMMUNITY)
Admission: RE | Disposition: A | Discharge status: Home / Self Care | Payer: Self-pay | Source: Home / Self Care | Attending: Vascular Surgery

## 2020-01-20 ENCOUNTER — Ambulatory Visit (HOSPITAL_COMMUNITY)
Admission: RE | Admit: 2020-01-20 | Discharge: 2020-01-20 | Disposition: A | Discharge status: Home / Self Care | Payer: Medicare HMO | Attending: Vascular Surgery | Admitting: Vascular Surgery

## 2020-01-20 DIAGNOSIS — N186 End stage renal disease: Secondary | ICD-10-CM

## 2020-01-20 DIAGNOSIS — Z20822 Contact with and (suspected) exposure to covid-19: Secondary | ICD-10-CM | POA: Diagnosis not present

## 2020-01-20 DIAGNOSIS — Z793 Long term (current) use of hormonal contraceptives: Secondary | ICD-10-CM | POA: Insufficient documentation

## 2020-01-20 DIAGNOSIS — E1051 Type 1 diabetes mellitus with diabetic peripheral angiopathy without gangrene: Secondary | ICD-10-CM | POA: Diagnosis not present

## 2020-01-20 DIAGNOSIS — Z87891 Personal history of nicotine dependence: Secondary | ICD-10-CM | POA: Insufficient documentation

## 2020-01-20 DIAGNOSIS — Z7982 Long term (current) use of aspirin: Secondary | ICD-10-CM | POA: Insufficient documentation

## 2020-01-20 DIAGNOSIS — Z79899 Other long term (current) drug therapy: Secondary | ICD-10-CM | POA: Insufficient documentation

## 2020-01-20 DIAGNOSIS — T82898A Other specified complication of vascular prosthetic devices, implants and grafts, initial encounter: Secondary | ICD-10-CM

## 2020-01-20 DIAGNOSIS — G4730 Sleep apnea, unspecified: Secondary | ICD-10-CM | POA: Diagnosis not present

## 2020-01-20 DIAGNOSIS — D631 Anemia in chronic kidney disease: Secondary | ICD-10-CM | POA: Diagnosis not present

## 2020-01-20 DIAGNOSIS — I120 Hypertensive chronic kidney disease with stage 5 chronic kidney disease or end stage renal disease: Secondary | ICD-10-CM | POA: Diagnosis not present

## 2020-01-20 DIAGNOSIS — Z794 Long term (current) use of insulin: Secondary | ICD-10-CM | POA: Diagnosis not present

## 2020-01-20 DIAGNOSIS — Z992 Dependence on renal dialysis: Secondary | ICD-10-CM

## 2020-01-20 DIAGNOSIS — Z86718 Personal history of other venous thrombosis and embolism: Secondary | ICD-10-CM | POA: Diagnosis not present

## 2020-01-20 DIAGNOSIS — E7800 Pure hypercholesterolemia, unspecified: Secondary | ICD-10-CM | POA: Insufficient documentation

## 2020-01-20 DIAGNOSIS — Z881 Allergy status to other antibiotic agents status: Secondary | ICD-10-CM | POA: Insufficient documentation

## 2020-01-20 DIAGNOSIS — T82858A Stenosis of vascular prosthetic devices, implants and grafts, initial encounter: Secondary | ICD-10-CM | POA: Diagnosis present

## 2020-01-20 DIAGNOSIS — E1022 Type 1 diabetes mellitus with diabetic chronic kidney disease: Secondary | ICD-10-CM | POA: Insufficient documentation

## 2020-01-20 DIAGNOSIS — Z880 Allergy status to penicillin: Secondary | ICD-10-CM | POA: Insufficient documentation

## 2020-01-20 DIAGNOSIS — Y841 Kidney dialysis as the cause of abnormal reaction of the patient, or of later complication, without mention of misadventure at the time of the procedure: Secondary | ICD-10-CM | POA: Insufficient documentation

## 2020-01-20 DIAGNOSIS — Z888 Allergy status to other drugs, medicaments and biological substances status: Secondary | ICD-10-CM | POA: Diagnosis not present

## 2020-01-20 HISTORY — PX: PERIPHERAL VASCULAR BALLOON ANGIOPLASTY: CATH118281

## 2020-01-20 LAB — POCT I-STAT, CHEM 8
BUN: 29 mg/dL — ABNORMAL HIGH (ref 6–20)
Calcium, Ion: 1.17 mmol/L (ref 1.15–1.40)
Chloride: 103 mmol/L (ref 98–111)
Creatinine, Ser: 4 mg/dL — ABNORMAL HIGH (ref 0.44–1.00)
Glucose, Bld: 100 mg/dL — ABNORMAL HIGH (ref 70–99)
HCT: 41 % (ref 36.0–46.0)
Hemoglobin: 13.9 g/dL (ref 12.0–15.0)
Potassium: 4.7 mmol/L (ref 3.5–5.1)
Sodium: 138 mmol/L (ref 135–145)
TCO2: 28 mmol/L (ref 22–32)

## 2020-01-20 LAB — HCG, SERUM, QUALITATIVE: Preg, Serum: NEGATIVE

## 2020-01-20 LAB — SARS CORONAVIRUS 2 BY RT PCR (HOSPITAL ORDER, PERFORMED IN ~~LOC~~ HOSPITAL LAB): SARS Coronavirus 2: NEGATIVE

## 2020-01-20 SURGERY — PERIPHERAL VASCULAR BALLOON ANGIOPLASTY
Anesthesia: LOCAL | Laterality: Left

## 2020-01-20 MED ORDER — SODIUM CHLORIDE 0.9% FLUSH: 3.0000 mL | Freq: Two times a day (BID) | INTRAVENOUS | Status: DC

## 2020-01-20 MED ORDER — SODIUM CHLORIDE 0.9 % IV SOLN: 250.0000 mL | INTRAVENOUS | Status: DC | PRN

## 2020-01-20 MED ORDER — HEPARIN (PORCINE) IN NACL 1000-0.9 UT/500ML-% IV SOLN
INTRAVENOUS | Status: AC
  Filled 2020-01-20: qty 500

## 2020-01-20 MED ORDER — HEPARIN SODIUM (PORCINE) 1000 UNIT/ML IJ SOLN
INTRAMUSCULAR | Status: AC
  Filled 2020-01-20: qty 1

## 2020-01-20 MED ORDER — HEPARIN SODIUM (PORCINE) 1000 UNIT/ML IJ SOLN
INTRAMUSCULAR | Status: DC | PRN
  Administered 2020-01-20: 3000 [IU] via INTRAVENOUS

## 2020-01-20 MED ORDER — HEPARIN (PORCINE) IN NACL 1000-0.9 UT/500ML-% IV SOLN
INTRAVENOUS | Status: DC | PRN
  Administered 2020-01-20: 500 mL

## 2020-01-20 MED ORDER — IODIXANOL 320 MG/ML IV SOLN
INTRAVENOUS | Status: DC | PRN
  Administered 2020-01-20: 40 mL via INTRAVENOUS

## 2020-01-20 MED ORDER — SODIUM CHLORIDE 0.9% FLUSH: 3.0000 mL | INTRAVENOUS | Status: DC | PRN

## 2020-01-20 MED ORDER — LIDOCAINE HCL (PF) 1 % IJ SOLN
INTRAMUSCULAR | Status: DC | PRN
  Administered 2020-01-20: 2 mL via INTRADERMAL

## 2020-01-20 MED ORDER — LIDOCAINE HCL (PF) 1 % IJ SOLN
INTRAMUSCULAR | Status: AC
  Filled 2020-01-20: qty 30

## 2020-01-20 SURGICAL SUPPLY — 17 items
BAG SNAP BAND KOVER 36X36 (MISCELLANEOUS) ×3 IMPLANT
BALLN MUSTANG 5.0X40 75 (BALLOONS) ×6
BALLOON MUSTANG 5.0X40 75 (BALLOONS) ×2 IMPLANT
COVER DOME SNAP 22 D (MISCELLANEOUS) ×3 IMPLANT
DCB RANGER 5.0X60 135 (BALLOONS) ×2 IMPLANT
KIT ENCORE 26 ADVANTAGE (KITS) ×2 IMPLANT
KIT MICROPUNCTURE NIT STIFF (SHEATH) ×2 IMPLANT
PROTECTION STATION PRESSURIZED (MISCELLANEOUS) ×3
RANGER DCB 5.0X60 135 (BALLOONS) ×6
SHEATH PINNACLE R/O II 7F 4CM (SHEATH) ×2 IMPLANT
SHEATH PROBE COVER 6X72 (BAG) ×3 IMPLANT
STATION PROTECTION PRESSURIZED (MISCELLANEOUS) ×2 IMPLANT
STOPCOCK MORSE 400PSI 3WAY (MISCELLANEOUS) ×3 IMPLANT
TRAY PV CATH (CUSTOM PROCEDURE TRAY) ×3 IMPLANT
TUBING CIL FLEX 10 FLL-RA (TUBING) ×3 IMPLANT
WIRE BENTSON .035X145CM (WIRE) ×2 IMPLANT
WIRE G V18X300CM (WIRE) ×2 IMPLANT

## 2020-01-20 NOTE — H&P (Signed)
History and Physical Interval Note:  01/20/2020 9:38 AM  [name]  has presented today for surgery, with the diagnosis of end stage renal.  The various methods of treatment have been discussed with the patient and family. After consideration of risks, benefits and other options for treatment, the patient has consented to  Procedure(s): A/V FISTULAGRAM (N/A) as a surgical intervention.  The patient's history has been reviewed, patient examined, no change in status, stable for surgery.  I have reviewed the patient's chart and labs.  Questions were answered to the patient's satisfaction.    Left arm fistulogram.  Marty Heck  Patient ID: [name], female   DOB: 1970/04/18, 49 y.o.   MRN: [id number]  Reason for Consult: New Patient (Initial Visit) (eval for AVG)   Referred by Dwana Melena, MD  Subjective:    Subjective [] Expand by Default   HPI:  [name] is a 49 y.o. female history of Tuesday and since then.  She is now sent here for consideration of graft.  Patient would really like to not have a graft.  She currently dialyzing via catheter.  She is hopeful to have another procedure prior to graft.      Past Medical History:  Diagnosis Date  . Anemia   . Arthritis   . Bronchitis   . C. difficile diarrhea 09/26/2014  . Cataracts, both eyes    had surgery to remove  . Cellulitis of right foot 06/02/2014  . CKD (chronic kidney disease) stage 3, GFR 30-59 ml/min (HCC)    Dialysis T, TH, Sat  . Diabetic ulcer of right foot ([place]) 06/02/2014  . DVT (deep venous thrombosis) ([place]) 10/2014   one in each one in leg- PICC line , one in right and left arm  . H/O seasonal allergies   . Heart murmur    told once when she was pregnant- no furter mention- was in notes 11/2015- had Echo 8 /4/17  . History of blood transfusion   . Hypercholesteremia   . Hypertension   . Left foot infection   . Neuromuscular disorder ([place])    lower legs  and feet  . Neuropathy    feet  . Neuropathy in diabetes ([place])   . Peripheral vascular disease ([place])   . Sleep apnea    does not use cpap  . Staphylococcus aureus bacteremia 10/2014  . Type 1 diabetes ([place])    onset age 89        Family History  Problem Relation Age of Onset  . Hyperlipidemia Mother   . Dementia Mother         Past Surgical History:  Procedure Laterality Date  . AMPUTATION TOE Left 07/24/2015   5th toe and Hallux amputation  . AV FISTULA PLACEMENT Left 08/06/2018   Procedure: INSERTION OF BASCILIC VEN TRANSPOSITION 1ST STAGE;  Surgeon: Rosetta Posner, MD;  Location: [facility];  Service: Vascular;  Laterality: Left;  . BASCILIC VEIN TRANSPOSITION Left 04/29/2019   Procedure: SECOND STAGE BASCILIC VEIN TRANSPOSITION LEFT ARM;  Surgeon: Rosetta Posner, MD;  Location: [facility];  Service: Vascular;  Laterality: Left;  . CESAREAN SECTION     x 1  . EXOSTECTECTOMY TOE Left 04/11/2016   Procedure: EXOSTECTECTOMY CUBOID AND 5TH METATARSAL AND PLACEMENT OF ANTIBIOTIC BEADS;  Surgeon: Edrick Kins, DPM;  Location: [facility];  Service: Podiatry;  Laterality: Left;  . EYE SURGERY Bilateral    removed cataracts - laser  . FEMORAL-POPLITEAL  BYPASS GRAFT Left 05/03/2015  . FOOT AMPUTATION THROUGH METATARSAL Right 2016  . hemrrhoidectomy    . I & D EXTREMITY Right 06/10/2014   Procedure: IRRIGATION AND DEBRIDEMENT Right Foot;  Surgeon: Newt Minion, MD;  Location: [facility];  Service: Orthopedics;  Laterality: Right;  . I & D EXTREMITY Right 07/31/2018   Procedure: IRRIGATION AND DEBRIDEMENT EXTREMITY;  Surgeon: Edrick Kins, DPM;  Location: [facility];  Service: Podiatry;  Laterality: Right;  . I & D EXTREMITY Right 08/04/2018   Procedure: IRRIGATION AND DEBRIDEMENT FOOT with placement of wound vac;  Surgeon: Edrick Kins, DPM;  Location: [facility];  Service: Podiatry;  Laterality: Right;  . INCISION AND DRAINAGE Left 04/11/2016   Procedure: INCISION AND DRAINAGE A DEEP  COMPLICATED WOUND OF LEFT FOOT;  Surgeon: Edrick Kins, DPM;  Location: [facility];  Service: Podiatry;  Laterality: Left;  . INSERTION OF DIALYSIS CATHETER Right 08/06/2018   Procedure: INSERTION OF TUNNELED DIALYSIS CATHETER;  Surgeon: Rosetta Posner, MD;  Location: [facility];  Service: Vascular;  Laterality: Right;  . IR FLUORO GUIDE CV LINE RIGHT  09/24/2017  . IR FLUORO GUIDE CV LINE RIGHT  08/01/2018  . IR REMOVAL TUN CV CATH W/O FL  11/22/2017  . IR US GUIDE VASC ACCESS RIGHT  09/24/2017  . IR US GUIDE VASC ACCESS RIGHT  08/01/2018  . IRRIGATION AND DEBRIDEMENT ABSCESS Left 05/06/2017   Procedure: IRRIGATION AND DEBRIDEMENT ABSCESS LEFT FOOT;  Surgeon: Edrick Kins, DPM;  Location: [facility];  Service: Podiatry;  Laterality: Left;  . PERIPHERALLY INSERTED CENTRAL CATHETER INSERTION    . SKIN GRAFT Right 06/15/2014  . SKIN GRAFT Left 06/2015   foot   . SKIN SPLIT GRAFT Right 06/15/2014   Procedure: SPLIT THICKNESS SKIN GRAFT RIGHT FOOT;  Surgeon: Newt Minion, MD;  Location: [facility];  Service: Orthopedics;  Laterality: Right;  . TRANSMETATARSAL AMPUTATION Right   . TRANSMETATARSAL AMPUTATION Right 09/11/2014  . WISDOM TOOTH EXTRACTION      Short Social History:  Social History        Tobacco Use  . Smoking status: Former Smoker    Years: 15.00    Quit date: 05/16/2008    Years since quitting: 11.6  . Smokeless tobacco: Never Used  Substance Use Topics  . Alcohol use: Not Currently    Comment: Occasional         Allergies  Allergen Reactions  . Cleocin [Clindamycin Hcl] Diarrhea  . Lisinopril Other (See Comments)    Elevated potassium per pt report  . Amoxicillin Diarrhea, Nausea Only and Other (See Comments)    Did it involve swelling of the face/tongue/throat, SOB, or low BP? No Did it involve sudden or severe rash/hives, skin peeling, or any reaction on the inside of your mouth or nose? No Did you need to seek medical attention at a hospital or doctor's  office? No When did it last happen?10 + years If all above answers are "NO", may proceed with cephalosporin use.    . Bactrim [Sulfamethoxazole-Trimethoprim] Diarrhea and Nausea Only          Current Outpatient Medications  Medication Sig Dispense Refill  . albuterol (PROVENTIL HFA;VENTOLIN HFA) 108 (90 BASE) MCG/ACT inhaler Inhale 2 puffs into the lungs every 6 (six) hours as needed for wheezing or shortness of breath.     Marland Kitchen aspirin EC 81 MG tablet Take 81 mg by mouth daily.    . B Complex-C-Zn-Folic Acid (DIALYVITE 094  WITH ZINC) 0.8 MG TABS Take 1 tablet by mouth daily.    . calcium acetate (PHOSLO) 667 MG capsule Take 2 capsules (1,334 mg total) by mouth 3 (three) times daily with meals. 180 capsule 0  . Continuous Blood Gluc Receiver (Baker) [place] 1 each by Does not apply route See admin instructions. Use Dexcom Receiver to monitor blood sugar continuously. 1 each 0  . Continuous Blood Gluc Sensor (DEXCOM G6 SENSOR) MISC 1 each by Does not apply route See admin instructions. Use one sensors once every 10 days to monitor blood sugars. 9 each 3  . Continuous Blood Gluc Transmit (DEXCOM G6 TRANSMITTER) MISC 1 each by Does not apply route See admin instructions. Use one transmitter once every 90 days. 1 each 3  . CONTOUR NEXT TEST test strip USE AS INSTRUCTED TO CHECK BLOOD SUGAR 4 TIMES A DAY. DX E10.65 100 strip 4  . Cyanocobalamin (B-12) 5000 MCG CAPS Take 5,000 mcg by mouth daily.    . diphenhydrAMINE (BENADRYL) 25 MG tablet Take 25 mg by mouth daily as needed.     . folic acid (FOLVITE) 400 MCG tablet Take 400 mcg by mouth daily.    . insulin aspart (NOVOLOG) 100 UNIT/ML injection USE A MAX OF 65 UNITS DAILY VIA INSULIN PUMP. 20 mL 2  . Insulin Human (INSULIN PUMP) SOLN Inject into the skin. Insulin aspart (Novolog) 100 unit/ml---MAX 65 UNITS DAILY    . norethindrone (AYGESTIN) 5 MG tablet Take 5 mg by mouth See admin instructions.     . Omega-3  Fatty Acids (OMEGA 3 500) 500 MG CAPS Take 500 mg by mouth daily.    . ondansetron (ZOFRAN) 4 MG tablet Take 4 mg by mouth every 8 (eight) hours as needed for nausea or vomiting.    . pravastatin (PRAVACHOL) 40 MG tablet TAKE 1 TABLET BY MOUTH EVERY DAY (Patient taking differently: Take 40 mg by mouth daily. ) 90 tablet 1  . sulfamethoxazole-trimethoprim (BACTRIM DS) 800-160 MG tablet Take 1 tablet by mouth daily.    . calcitRIOL (ROCALTROL) 0.5 MCG capsule Take 1 capsule daily (Patient not taking: Reported on 11/04/2019) 90 capsule 2   No current facility-administered medications for this visit.    Review of Systems  Constitutional:  Constitutional negative. HENT: HENT negative.  Eyes: Eyes negative.  Respiratory: Respiratory negative.  Cardiovascular: Cardiovascular negative.  GI: Gastrointestinal negative.  Musculoskeletal: Musculoskeletal negative.  Skin: Positive for wound.  Neurological: Neurological negative. Hematologic: Hematologic/lymphatic negative.  Psychiatric: Psychiatric negative.        Objective:   Objective [] Expand by Default       Vitals:   01/15/20 0925  BP: (!) 186/89  Pulse: 76  Resp: 14  Temp: (!) 96 F (35.6 C)  TempSrc: Temporal  SpO2: 98%  Weight: 125 lb 10.6 oz (57 kg)  Height: 5\' 7"  (1.702 m)   Body mass index is 19.68 kg/m.  Physical Exam Cardiovascular:     Rate and Rhythm: Normal rate.     Pulses:          Radial pulses are 1+ on the right side and 1+ on the left side.  Abdominal:     General: Abdomen is flat.  Skin:    General: Skin is warm.     Capillary Refill: Capillary refill takes less than 2 seconds.  Neurological:     General: No focal deficit present.     Mental Status: She is alert.  Psychiatric:  Mood and Affect: Mood normal.        Behavior: Behavior normal.        Thought Content: Thought content normal.        Judgment: Judgment normal.      Data: +-----------------+-------------+----------+--------+  Right Cephalic  Diameter (cm)Depth (cm)Findings  +-----------------+-------------+----------+--------+  Shoulder       0.27              +-----------------+-------------+----------+--------+  Prox upper arm    0.23              +-----------------+-------------+----------+--------+  Mid upper arm    0.25              +-----------------+-------------+----------+--------+  Dist upper arm    0.27              +-----------------+-------------+----------+--------+  Antecubital fossa  0.25              +-----------------+-------------+----------+--------+  Prox forearm     0.10              +-----------------+-------------+----------+--------+  Mid forearm     0.10              +-----------------+-------------+----------+--------+  Dist forearm     0.08              +-----------------+-------------+----------+--------+   +-----------------+-------------+----------+--------+  Right Basilic  Diameter (cm)Depth (cm)Findings  +-----------------+-------------+----------+--------+  Prox upper arm    0.29              +-----------------+-------------+----------+--------+  Mid upper arm    0.36              +-----------------+-------------+----------+--------+  Dist upper arm    0.34              +-----------------+-------------+----------+--------+  Antecubital fossa  0.27              +-----------------+-------------+----------+--------+  Prox forearm     0.19              +-----------------+-------------+----------+--------+   +-----------------+-------------+----------+----------------+  Left Cephalic  Diameter (cm)Depth (cm)  Findings     +-----------------+-------------+----------+----------------+  Prox upper arm    0.27                  +-----------------+-------------+----------+----------------+  Mid upper arm    0.23                  +-----------------+-------------+----------+----------------+  Dist upper arm    0.23                  +-----------------+-------------+----------+----------------+  Antecubital fossa  0.26                  +-----------------+-------------+----------+----------------+  Prox forearm     0.19        Chronic thrombus  +-----------------+-------------+----------+----------------+  Mid forearm               Chronic thrombus  +-----------------+-------------+----------+----------------+  Dist forearm     0.08        Chronic thrombus  +-----------------+-------------+----------+----------------+  Wrist        0.10        Chronic thrombus  +-----------------+-------------+----------+----------------+   +-----------------+-------------+----------+----------+  Left Basilic   Diameter (cm)Depth (cm) Findings   +-----------------+-------------+----------+----------+  Prox upper arm    0.45     0.37  161 cm/sec  +-----------------+-------------+----------+----------+  Mid upper arm    0.49     0.13         +-----------------+-------------+----------+----------+  Dist upper arm    0.42     0.13         +-----------------+-------------+----------+----------+  Antecubital fossa  0.55     0.10  410 cm/sec  +-----------------+-------------+----------+----------+      Assessment/Plan:   Assessment    49 year old female here for evaluation of her dialysis access in the left upper extremity.  Patient would like to keep the basilic vein if at all possible.  I discussed  with her the more likely need for graft in the left upper extremity.  She has requested 1 more fistulogram to possibly salvage this fistula we will get that scheduled on a nondialysis day.  More than likely from that procedure she is going to need consideration of conversion to left upper extremity graft if she has any central issues we would need to consider right upper extremity fistula versus graft.  She demonstrates good understanding we will set her up for a Wednesday in the very near future.     Waynetta Sandy MD [facility]

## 2020-01-20 NOTE — Discharge Instructions (Signed)
Dialysis Fistulogram, Care After This sheet gives you information about how to care for yourself after your procedure. Your health care provider may also give you more specific instructions. If you have problems or questions, contact your health care provider. What can I expect after the procedure? After the procedure, it is common to have:  A small amount of discomfort in the area where the small, thin tube (catheter) was placed for the procedure.  A small amount of bruising around the fistula.  Sleepiness and tiredness (fatigue). Follow these instructions at home: Activity   Rest at home and do not lift anything that is heavier than 5 lb (2.3 kg) on the day after your procedure.  Return to your normal activities as told by your health care provider. Ask your health care provider what activities are safe for you.  Do not drive or use heavy machinery while taking prescription pain medicine.  Do not drive for 24 hours if you were given a medicine to help you relax (sedative) during your procedure. Medicines   Take over-the-counter and prescription medicines only as told by your health care provider. Puncture site care  Follow instructions from your health care provider about how to take care of the site where catheters were inserted. Make sure you: ? Wash your hands with soap and water before you change your bandage (dressing). If soap and water are not available, use hand sanitizer. ? Change your dressing as told by your health care provider. ? Leave stitches (sutures), skin glue, or adhesive strips in place. These skin closures may need to stay in place for 2 weeks or longer. If adhesive strip edges start to loosen and curl up, you may trim the loose edges. Do not remove adhesive strips completely unless your health care provider tells you to do that.  Check your puncture area every day for signs of infection. Check for: ? Redness, swelling, or pain. ? Fluid or  blood. ? Warmth. ? Pus or a bad smell. General instructions  Do not take baths, swim, or use a hot tub until your health care provider approves. Ask your health care provider if you may take showers. You may only be allowed to take sponge baths.  Monitor your dialysis fistula closely. Check to make sure that you can feel a vibration or buzz (a thrill) when you put your fingers over the fistula.  Prevent damage to your graft or fistula: ? Do not wear tight-fitting clothing or jewelry on the arm or leg that has your graft or fistula. ? Tell all your health care providers that you have a dialysis fistula or graft. ? Do not allow blood draws, IVs, or blood pressure readings to be done in the arm that has your fistula or graft. ? Do not allow flu shots or vaccinations in the arm with your fistula or graft.  Keep all follow-up visits as told by your health care provider. This is important. Contact a health care provider if:  You have redness, swelling, or pain at the site where the catheter was put in.  You have fluid or blood coming from the catheter site.  The catheter site feels warm to the touch.  You have pus or a bad smell coming from the catheter site.  You have a fever or chills. Get help right away if:  You feel weak.  You have trouble balancing.  You have trouble moving your arms or legs.  You have problems with your speech or vision.  You   can no longer feel a vibration or buzz when you put your fingers over your dialysis fistula.  The limb that was used for the procedure: ? Swells. ? Is painful. ? Is cold. ? Is discolored, such as blue or pale white.  You have chest pain or shortness of breath. Summary  After a dialysis fistulogram, it is common to have a small amount of discomfort or bruising in the area where the small, thin tube (catheter) was placed.  Rest at home on the day after your procedure. Return to your normal activities as told by your health care  provider.  Take over-the-counter and prescription medicines only as told by your health care provider.  Follow instructions from your health care provider about how to take care of the site where the catheter was inserted.  Keep all follow-up visits as told by your health care provider. This information is not intended to replace advice given to you by your health care provider. Make sure you discuss any questions you have with your health care provider. Document Revised: 04/19/2017 Document Reviewed: 04/19/2017 Elsevier Patient Education  2020 [employer]  

## 2020-01-20 NOTE — Progress Notes (Signed)
Dr Carlis Abbott notified of B/P and advised client to take B/P pills when gets home and she voiced understanding

## 2020-01-20 NOTE — Op Note (Signed)
OPERATIVE NOTE   PROCEDURE: 1. left brachiobasilic arteriovenous fistula cannulation under ultrasound guidance 2. left arm fistuogram including central venogram 3. left peripheral venoplasty of basilic vein in upper arm (5 mm Mustang and 5 mm drug coated Ranger)   PRE-OPERATIVE DIAGNOSIS: Malfunctioning left arteriovenous fistula  POST-OPERATIVE DIAGNOSIS: same as above   SURGEON: Marty Heck, MD  ANESTHESIA: local  ESTIMATED BLOOD LOSS: 5 cc  FINDING(S): 1. The left brachiobasilic fistula was accessed.  Initial fistulogram showed a focal 95% stenosis at the junction of the basilic vein to the axillary vein.  This was angioplastied with a 5 mm Mustang and 5 mm drug-coated Ranger.  Ultimately there was significant residual stenosis with recoil.  I did not want to stent this given that would require stenting into the axillary vein and would likely prevent the option of an upper arm graft in the future.  In addition the basilic vein was very sclerotic and small by the artery anastomosis.  The sheath was completely occlusive here and we could not get a reflux shot even with a balloon in the basilic vein and putting a blood pressure cuff on the upper arm.  Given so many issues both at the arterial anastomosis side of the vein and the outflow side of the vein in the upper arm, I think she would best benefit from upper arm graft.  The fistula has a very poor thrill.  I discussed scheduling left arm AVG with her today and she wants to think about it and will contact our office to schedule.  The other option will be right arm access but she does not want that either.  SPECIMEN(S):  None  CONTRAST: 40 cc  INDICATIONS: [name] is a 49 y.o. female who  presents with malfunctioning left brachiobasilic arteriovenous fistula.  She was referred back by [facility] for upper arm graft after multiple previous interventions.  Ultimately she refused AVG and wanted one more fistulogram  after seeing Dr. Donzetta Matters.  The patient is scheduled for left arm fistulogram.  The patient is aware the risks include but are not limited to: bleeding, infection, thrombosis of the cannulated access, and possible anaphylactic reaction to the contrast.  The patient is aware of the risks of the procedure and elects to proceed forward.  DESCRIPTION: After full informed written consent was obtained, the patient was brought back to the angiography suite and placed supine upon the angiography table.  The patient was connected to monitoring equipment.  The left arm was prepped and draped in the standard fashion for a left arm fistulogram.  Under ultrasound guidance, the left arteriovenous fistula was evaluated it was patent altough small and the vein was sclerotic by artery anastmosis.  It was cannulated with a micropuncture needle.  The microwire was advanced into the fistula and the needle was exchanged for the a microsheath, which was lodged 2 cm into the access.  The wire was removed and the sheath was connected to the IV extension tubing.  Hand injections were completed to image the access from the antecubitum up to the level of axilla.  Ultimately initial evaluation showed a focal 95% stenosis where the basilic vein drained into the axillary vein.  I used a Bentson wire and exchanged for a short 7 [place] sheath.  Patient was given 3000 units of IV heparin.  I initially treated this with a 5 mm Mustang to nominal pressure for 2 minutes given this was the approximate size of the normal  vein basilic vein adjacent to this lesion.  She had significant pain and then I treated this with a drug-coated Ranger as well for 3 minutes.  With significant recoil of the lesion.  Ultimately neck step would have been stenting but I did not want a stent into her axillary vein and then burned the bridge of an AV graft in the future.  We tried to get a reflux shot with the balloon up and apparently the vein was so sclerotic around the  sheath that I could not get it to reflux across the sheath into the artery and the sheath was occlusive.  We even tried putting up a blood pressure cuff on the upper arm and it would not work.  When I evaluate this with ultrasound it looked very small and sclerotic on access.  Given multiple issues with the fistula I think she would benefit from upper arm graft.  Wires and catheters were removed.  A 4-0 Monocryl purse-string suture was sewn around the sheath.  The sheath was removed while tying down the suture.  A sterile bandage was applied to the puncture site.  COMPLICATIONS: None  CONDITION: Stable  Marty Heck, MD [facility] Office: [place]   01/20/2020 10:49 AM

## 2020-01-25 ENCOUNTER — Other Ambulatory Visit: Payer: Self-pay | Admitting: *Deleted

## 2020-01-25 DIAGNOSIS — N186 End stage renal disease: Secondary | ICD-10-CM

## 2020-01-25 DIAGNOSIS — Z992 Dependence on renal dialysis: Secondary | ICD-10-CM

## 2020-02-01 ENCOUNTER — Other Ambulatory Visit: Payer: Self-pay

## 2020-02-01 ENCOUNTER — Encounter (HOSPITAL_BASED_OUTPATIENT_CLINIC_OR_DEPARTMENT_OTHER): Payer: Medicare HMO | Attending: Internal Medicine | Admitting: Internal Medicine

## 2020-02-01 DIAGNOSIS — L97411 Non-pressure chronic ulcer of right heel and midfoot limited to breakdown of skin: Secondary | ICD-10-CM | POA: Diagnosis not present

## 2020-02-01 DIAGNOSIS — Z9119 Patient's noncompliance with other medical treatment and regimen: Secondary | ICD-10-CM | POA: Insufficient documentation

## 2020-02-01 DIAGNOSIS — Z9641 Presence of insulin pump (external) (internal): Secondary | ICD-10-CM | POA: Diagnosis not present

## 2020-02-01 DIAGNOSIS — Z89421 Acquired absence of other right toe(s): Secondary | ICD-10-CM | POA: Diagnosis not present

## 2020-02-01 DIAGNOSIS — Z86718 Personal history of other venous thrombosis and embolism: Secondary | ICD-10-CM | POA: Insufficient documentation

## 2020-02-01 DIAGNOSIS — E10621 Type 1 diabetes mellitus with foot ulcer: Secondary | ICD-10-CM | POA: Diagnosis not present

## 2020-02-01 DIAGNOSIS — M86671 Other chronic osteomyelitis, right ankle and foot: Secondary | ICD-10-CM | POA: Insufficient documentation

## 2020-02-01 DIAGNOSIS — L97514 Non-pressure chronic ulcer of other part of right foot with necrosis of bone: Secondary | ICD-10-CM | POA: Diagnosis not present

## 2020-02-01 DIAGNOSIS — Z87891 Personal history of nicotine dependence: Secondary | ICD-10-CM | POA: Diagnosis not present

## 2020-02-01 DIAGNOSIS — E1042 Type 1 diabetes mellitus with diabetic polyneuropathy: Secondary | ICD-10-CM | POA: Insufficient documentation

## 2020-02-01 DIAGNOSIS — E1069 Type 1 diabetes mellitus with other specified complication: Secondary | ICD-10-CM | POA: Diagnosis present

## 2020-02-01 NOTE — Progress Notes (Signed)
[name], [name] ([id number]) Visit Report for 02/01/2020 Arrival Information Details Patient Name: Date of Service: [name] 02/01/2020 12:45 PM Medical Record Number: [id number] Patient Account Number: [id number] Date of Birth/Sex: Treating RN: November 17, 1970 (49 y.o. [name], [name] Primary Care [name]: [name], [name] Other Clinician: Referring [name]: Treating [name]/Extender: [name], [name] Weeks in Treatment: 8 Visit Information History Since Last Visit Added or deleted any medications: No Patient Arrived: Wheel Chair Any new allergies or adverse reactions: No Arrival Time: 12:58 Had a fall or experienced change in No Accompanied By: self activities of daily living that may affect Transfer Assistance: None risk of falls: Patient Identification Verified: Yes Signs or symptoms of abuse/neglect since last visito No Secondary Verification Process Completed: Yes Hospitalized since last visit: No Patient Requires Transmission-Based Precautions: No Implantable device outside of the clinic excluding No Patient Has Alerts: No cellular tissue based products placed in the center since last visit: Has Dressing in Place as Prescribed: Yes Pain Present Now: No Electronic Signature(s) Signed: 02/01/2020 5:46:37 PM By: Baruch Gouty RN, BSN Entered By: Baruch Gouty on 02/01/2020 13:02:50 -------------------------------------------------------------------------------- Clinic Level of Care Assessment Details Patient Name: Date of Service: [name], [name] 02/01/2020 12:45 PM Medical Record Number: [id number] Patient Account Number: [id number] Date of Birth/Sex: Treating RN: 1970-06-09 (49 y.o. [name] Primary Care [name]: [name], [name] Other Clinician: Referring [name]: Treating [name]/Extender: [name], [name] Weeks in Treatment: 42 Clinic Level of Care Assessment Items TOOL 4 Quantity  Score X- 1 0 Use when only an EandM is performed on FOLLOW-UP visit ASSESSMENTS - Nursing Assessment / Reassessment X- 1 10 Reassessment of Co-morbidities (includes updates in patient status) X- 1 5 Reassessment of Adherence to Treatment Plan ASSESSMENTS - Wound and Skin A ssessment / Reassessment X - Simple Wound Assessment / Reassessment - one wound 1 5 []  - 0 Complex Wound Assessment / Reassessment - multiple wounds []  - 0 Dermatologic / Skin Assessment (not related to wound area) ASSESSMENTS - Focused Assessment []  - 0 Circumferential Edema Measurements - multi extremities []  - 0 Nutritional Assessment / Counseling / Intervention X- 1 5 Lower Extremity Assessment (monofilament, tuning fork, pulses) []  - 0 Peripheral Arterial Disease Assessment (using hand held doppler) ASSESSMENTS - Ostomy and/or Continence Assessment and Care []  - 0 Incontinence Assessment and Management []  - 0 Ostomy Care Assessment and Management (repouching, etc.) PROCESS - Coordination of Care X - Simple Patient / Family Education for ongoing care 1 15 []  - 0 Complex (extensive) Patient / Family Education for ongoing care X- 1 10 Staff obtains Programmer, systems, Records, T Results / Process Orders est X- 1 10 Staff telephones HHA, Nursing Homes / Clarify orders / etc []  - 0 Routine Transfer to another Facility (non-emergent condition) []  - 0 Routine Hospital Admission (non-emergent condition) []  - 0 New Admissions / Biomedical engineer / Ordering NPWT Apligraf, etc. , []  - 0 Emergency Hospital Admission (emergent condition) X- 1 10 Simple Discharge Coordination []  - 0 Complex (extensive) Discharge Coordination PROCESS - Special Needs []  - 0 Pediatric / Minor Patient Management []  - 0 Isolation Patient Management []  - 0 Hearing / Language / Visual special needs []  - 0 Assessment of Community assistance (transportation, D/C planning, etc.) []  - 0 Additional assistance / Altered  mentation []  - 0 Support Surface(s) Assessment (bed, cushion, seat, etc.) INTERVENTIONS - Wound Cleansing / Measurement X - Simple Wound  Cleansing - one wound 1 5 []  - 0 Complex Wound Cleansing - multiple wounds X- 1 5 Wound Imaging (photographs - any number of wounds) []  - 0 Wound Tracing (instead of photographs) X- 1 5 Simple Wound Measurement - one wound []  - 0 Complex Wound Measurement - multiple wounds INTERVENTIONS - Wound Dressings []  - 0 Small Wound Dressing one or multiple wounds X- 1 15 Medium Wound Dressing one or multiple wounds []  - 0 Large Wound Dressing one or multiple wounds X- 1 5 Application of Medications - topical []  - 0 Application of Medications - injection INTERVENTIONS - Miscellaneous []  - 0 External ear exam []  - 0 Specimen Collection (cultures, biopsies, blood, body fluids, etc.) []  - 0 Specimen(s) / Culture(s) sent or taken to Lab for analysis []  - 0 Patient Transfer (multiple staff / Civil Service fast streamer / Similar devices) []  - 0 Simple Staple / Suture removal (25 or less) []  - 0 Complex Staple / Suture removal (26 or more) []  - 0 Hypo / Hyperglycemic Management (close monitor of Blood Glucose) []  - 0 Ankle / Brachial Index (ABI) - do not check if billed separately X- 1 5 Vital Signs Has the patient been seen at the hospital within the last three years: Yes Total Score: 110 Level Of Care: New/Established - Level 3 Electronic Signature(s) Signed: 02/01/2020 5:57:05 PM By: Nancy Hurst RN, BSN Entered By: [name] on 02/01/2020 13:29:56 -------------------------------------------------------------------------------- Encounter Discharge Information Details Patient Name: Date of Service: [name], [name] 02/01/2020 12:45 PM Medical Record Number: [id number] Patient Account Number: [id number] Date of Birth/Sex: Treating RN: 1971-02-27 (49 y.o. [name] Primary Care [name]: [name], [name] Other Clinician: Referring  [name]: Treating [name]/Extender: [name], [name] Weeks in Treatment: 65 Encounter Discharge Information Items Discharge Condition: Stable Ambulatory Status: Wheelchair Discharge Destination: Home Transportation: Other Accompanied By: self Schedule Follow-up Appointment: Yes Clinical Summary of Care: Patient Declined Notes transportation service Electronic Signature(s) Signed: 02/01/2020 6:07:40 PM By: Deon Pilling Entered By: Deon Pilling on 02/01/2020 17:48:37 -------------------------------------------------------------------------------- Lower Extremity Assessment Details Patient Name: Date of Service: [name] 02/01/2020 12:45 PM Medical Record Number: [id number] Patient Account Number: [id number] Date of Birth/Sex: Treating RN: [birth year] (49 y.o. [name] Primary Care Bryker Fletchall: [name], [name] Other Clinician: Referring Fabian Walder: Treating Nevelyn Mellott/Extender: [name], [name] Weeks in Treatment: 73 Edema Assessment Assessed: [Left: No] [Right: No] Edema: [Left: N] [Right: o] Calf Left: Right: Point of Measurement: From Medial Instep 26.5 cm Ankle Left: Right: Point of Measurement: From Medial Instep 18 cm Vascular Assessment Pulses: Dorsalis Pedis Palpable: [Right:Yes] Electronic Signature(s) Signed: 02/01/2020 5:46:37 PM By: Baruch Gouty RN, BSN Entered By: Baruch Gouty on 02/01/2020 13:08:55 -------------------------------------------------------------------------------- Multi Wound Chart Details Patient Name: Date of Service: [name] 02/01/2020 12:45 PM Medical Record Number: [id number] Patient Account Number: [id number] Date of Birth/Sex: Treating RN: 07/16/1970 (49 y.o. [name] Primary Care Jericka Kadar: [name], [name] Other Clinician: Referring Dorthea Maina: Treating Falicia Lizotte/Extender: [name], [name] Weeks in Treatment: 38 Vital  Signs Height(in): 67 Capillary Blood Glucose(mg/dl): 384 Weight(lbs): 125 Pulse(bpm): 61 Body Mass Index(BMI): 20 Blood Pressure(mmHg): 210/93 Temperature(F): 98.5 Respiratory Rate(breaths/min): 18 Photos: [43:No Photos Right, Medial Foot] [N/A:N/A N/A] Wound Location: [43:Gradually Appeared] [N/A:N/A] Wounding Event: [43:Diabetic Wound/Ulcer of the Lower] [N/A:N/A] Primary Etiology: [43:Extremity Cataracts, Chronic sinus] [N/A:N/A] Comorbid History: [43:problems/congestion, Anemia, Sleep Apnea, Deep Vein Thrombosis, Hypertension,  Peripheral Arterial Disease, Type I Diabetes, Osteoarthritis, Osteomyelitis, Neuropathy, Seizure Disorder 08/04/2018] [N/A:N/A] Date Acquired: [43:73] [N/A:N/A] Weeks of Treatment: [43:Open] [N/A:N/A] Wound Status: [43:Yes] [N/A:N/A] Clustered Wound: [43:2.8x3.3x0.1] [N/A:N/A] Measurements L x W x D (cm) [43:7.257] [N/A:N/A] A (cm) : rea [43:0.726] [N/A:N/A] Volume (cm) : [43:90.00%] [N/A:N/A] % Reduction in A rea: [43:96.70%] [N/A:N/A] % Reduction in Volume: [43:Grade 4] [N/A:N/A] Classification: [43:Medium] [N/A:N/A] Exudate A mount: [43:Serosanguineous] [N/A:N/A] Exudate Type: [43:red, brown] [N/A:N/A] Exudate Color: [43:Flat and Intact] [N/A:N/A] Wound Margin: [43:Medium (34-66%)] [N/A:N/A] Granulation A mount: [43:Red, Pink] [N/A:N/A] Granulation Quality: [43:Medium (34-66%)] [N/A:N/A] Necrotic A mount: [43:Fat Layer (Subcutaneous Tissue): Yes N/A] Exposed Structures: [43:Fascia: No Tendon: No Muscle: No Joint: No Bone: No Small (1-33%)] [N/A:N/A] Treatment Notes Electronic Signature(s) Signed: 02/01/2020 5:37:06 PM By: Linton Ham MD Signed: 02/01/2020 5:57:05 PM By: Nancy Hurst RN, BSN Entered By: Linton Ham on 02/01/2020 13:28:39 -------------------------------------------------------------------------------- Multi-Disciplinary Care Plan Details Patient Name: Date of Service: [place] LLO [address] [name] 02/01/2020 12:45  PM Medical Record Number: [id number] Patient Account Number: [id number] Date of Birth/Sex: Treating RN: 1970-11-05 (49 y.o. [name] Primary Care Lissa Rowles: [name], [name] Other Clinician: Referring Lanyiah Brix: Treating Osmin Welz/Extender: [name], [name] Weeks in Treatment: 74 Active Inactive Wound/Skin Impairment Nursing Diagnoses: Impaired tissue integrity Knowledge deficit related to ulceration/compromised skin integrity Goals: Patient/caregiver will verbalize understanding of skin care regimen Date Initiated: 09/03/2018 Target Resolution Date: 02/19/2020 Goal Status: Active Ulcer/skin breakdown will have a volume reduction of 30% by week 4 Date Initiated: 09/03/2018 Date Inactivated: 10/07/2018 Target Resolution Date: 10/01/2018 Goal Status: Unmet Unmet Reason: COMORBITIES Ulcer/skin breakdown will have a volume reduction of 50% by week 8 Date Initiated: 10/07/2018 Date Inactivated: 11/03/2018 Target Resolution Date: 10/31/2018 Goal Status: Unmet Unmet Reason: Osteomyelitis Interventions: Assess patient/caregiver ability to obtain necessary supplies Assess patient/caregiver ability to perform ulcer/skin care regimen upon admission and as needed Assess ulceration(s) every visit Provide education on ulcer and skin care Treatment Activities: Skin care regimen initiated : 09/03/2018 Topical wound management initiated : 09/03/2018 Notes: Electronic Signature(s) Signed: 02/01/2020 5:57:05 PM By: Nancy Hurst RN, BSN Entered By: [name] on 02/01/2020 13:19:43 -------------------------------------------------------------------------------- Pain Assessment Details Patient Name: Date of Service: [name] 02/01/2020 12:45 PM Medical Record Number: [id number] Patient Account Number: [id number] Date of Birth/Sex: Treating RN: [birth year] (49 y.o. [name] Primary Care Malory Spurr: [name], [name] Other Clinician: Referring  Jalaysha Skilton: Treating Yailin Biederman/Extender: [name], [name] Weeks in Treatment: 68 Active Problems Location of Pain Severity and Description of Pain Patient Has Paino No Site Locations Rate the pain. Current Pain Level: 0 Pain Management and Medication Current Pain Management: Electronic Signature(s) Signed: 02/01/2020 5:46:37 PM By: Baruch Gouty RN, BSN Entered By: Baruch Gouty on 02/01/2020 13:08:30 -------------------------------------------------------------------------------- Patient/Caregiver Education Details Patient Name: Date of Service: [name] 11/1/2021andnbsp12:45 PM Medical Record Number: [id number] Patient Account Number: [id number] Date of Birth/Gender: Treating RN: 12/18/1970 (49 y.o. [name] Primary Care Physician: [name], [name] Other Clinician: Referring Physician: Treating Physician/Extender: [name], [name] Weeks in Treatment: 68 Education Assessment Education Provided To: Patient Education Topics Provided Wound/Skin Impairment: Methods: Explain/Verbal Responses: State content correctly Electronic Signature(s) Signed: 02/01/2020 5:57:05 PM By: Nancy Hurst RN, BSN Entered By: [name] on 02/01/2020 13:19:57 -------------------------------------------------------------------------------- Wound Assessment Details Patient Name: Date of Service: [name] 02/01/2020 12:45 PM Medical Record Number: [id number] Patient Account  Number: [id number] Date of Birth/Sex: Treating RN: June 27, 1970 (49 y.o. [name] Primary Care Jazmin Vensel: [name], [name] Other Clinician: Referring Anysha Frappier: Treating Feven Alderfer/Extender: [name], [name] Weeks in Treatment: 73 Wound Status Wound Number: [id number] Primary Diabetic Wound/Ulcer of the Lower Extremity Etiology: Wound Location: Right, Medial Foot Wound Open Wounding Event: Gradually Appeared Status: Date  Acquired: 08/04/2018 Comorbid Cataracts, Chronic sinus problems/congestion, Anemia, Sleep Weeks Of Treatment: 73 History: Apnea, Deep Vein Thrombosis, Hypertension, Peripheral Arterial Clustered Wound: Yes Disease, Type I Diabetes, Osteoarthritis, Osteomyelitis, Neuropathy, Seizure Disorder Wound Measurements Length: (cm) 2.8 Width: (cm) 3.3 Depth: (cm) 0.1 Area: (cm) 7.257 Volume: (cm) 0.726 % Reduction in Area: 90% % Reduction in Volume: 96.7% Epithelialization: Small (1-33%) Tunneling: No Undermining: No Wound Description Classification: Grade 4 Wound Margin: Flat and Intact Exudate Amount: Medium Exudate Type: Serosanguineous Exudate Color: red, brown Foul Odor After Cleansing: No Slough/Fibrino Yes Wound Bed Granulation Amount: Medium (34-66%) Exposed Structure Granulation Quality: Red, Pink Fascia Exposed: No Necrotic Amount: Medium (34-66%) Fat Layer (Subcutaneous Tissue) Exposed: Yes Necrotic Quality: Adherent Slough Tendon Exposed: No Muscle Exposed: No Joint Exposed: No Bone Exposed: No Treatment Notes Wound #43 (Right, Medial Foot) 3. Primary Dressing Applied Hydrofera Blue 4. Secondary Dressing Dry Gauze Roll Gauze Notes ace wrap as secondary. Electronic Signature(s) Signed: 02/01/2020 5:46:37 PM By: Baruch Gouty RN, BSN Entered By: Baruch Gouty on 02/01/2020 13:11:48 -------------------------------------------------------------------------------- Vitals Details Patient Name: Date of Service: [name] 02/01/2020 12:45 PM Medical Record Number: [id number] Patient Account Number: [id number] Date of Birth/Sex: Treating RN: 03-22-1971 (49 y.o. [name] Primary Care Nayeli Calvert: [name], [name] Other Clinician: Referring Sameen Leas: Treating Delbert Vu/Extender: [name], [name] Weeks in Treatment: 73 Vital Signs Time Taken: 13:04 Temperature (F): 98.5 Height (in): 67 Pulse (bpm): 86 Source:  Stated Respiratory Rate (breaths/min): 18 Weight (lbs): 125 Blood Pressure (mmHg): 210/93 Source: Stated Capillary Blood Glucose (mg/dl): 384 Body Mass Index (BMI): 19.6 Reference Range: 80 - 120 mg / dl Notes glucose per pt's insulin pump Electronic Signature(s) Signed: 02/01/2020 5:46:37 PM By: Baruch Gouty RN, BSN Entered By: Baruch Gouty on 02/01/2020 13:09:44

## 2020-02-01 NOTE — Progress Notes (Signed)
[name] ([id number]) Visit Report for 02/01/2020 HPI Details Patient Name: Date of Service: [name] 02/01/2020 12:45 PM Medical Record Number: [id number] Patient Account Number: [id number] Date of Birth/Sex: Treating RN: 1970/10/28 (49 y.o. [name] Primary Care Provider: Sanjuana [name], [name] Other Clinician: Referring Provider: Treating Provider/Extender: [name], [name] Weeks in Treatment: 65 History of Present Illness HPI Description: 49 year old diabetic who is known to have type 1 diabetes which is poorly controlled last hemoglobin A1c was 11%. She comes in with a ulcerated area on the left lateral foot which has been there for over 6 months. Was recently she has been treated by [name] of podiatry who saw her last on 05/28/2016. Review of his notes revealed that the patient had incision and drainage with placement of antibiotic beads to the left foot on 04/11/2016 for possible osteomyelitis of the cuboid bone. Over the last year she's had a history of amputation of the left fifth toe and a femoropopliteal popliteal bypass graft somewhere in April 2017. 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 His note [name] mentions that the patient has been referred to me for further wound care and possibly great candidate for hyperbaric oxygen therapy due to recurrent osteomyelitis. However we do not have any x-rays of biopsy reports confirming this. He has been on several antibiotics including Bactrim and most recently is on doxycycline for an MRSA. I understand, the patient was not a candidate for IV antibiotics as she has had previous PICC lines which resulted in blood clots in both arms. There was a x-ray report dated 04/04/2016 on [name] notes which showed evidence of fifth ray resection left foot with osteolytic changes noted to the fourth metatarsal and cuboid bone on the left. 06/13/2016 -- had a left foot x-ray which  showed no acute fracture or dislocation and no definite radiographic evidence of osteomyelitis. Advanced osteopenia was seen. 06/20/2016 -- she has noticed a new wound on the right plantar foot in the region where she had a callus before. 06/27/16- the patient did have her x-ray of the right foot which showed no findings to suggest osteomyelitis. She saw her endocrinologist, [name], yesterday. Her A1c in January was 11. He also indicates mismanagement and noncompliance regarding her diabetes. She is currently on Bactrim for a lip infection. She is complaining of nausea, vomiting and diarrhea. She is unable to articulate the exact orders or dosing of the Bactrim; it is unclear when she will complete this. 07/04/2016 -- results from [facility] of ABIs with ankle waveforms were noted from 02/14/2016. The examination done on 06/27/2015 showed noncompressible ABIs with the right being 1.45 and the left being 1.33. The present examination showed a right ABI of 1.19 on the left of 1.33. The conclusion was that right normal ABI in the lower extremity at rest however compared to previous study which was noncompressible ABI may be falsely elevated side suggesting medial calcification. The left ABI suggested medial calcification. 08/01/2016 -- the patient had more redness and pain on her right foot and did not get to come to see as noted she see her PCP or go to the ER and decided to take some leftover metronidazole which she had at home. As usual, the patient does report she feels and is rather noncompliant. 08/08/2016 -- -- x-ray of the right foot -- FINDINGS:Transmetatarsal amputation is noted. No bony destruction is noted to suggest osteomyelitis. IMPRESSION: No evidence of  osteomyelitis. Postsurgical changes are seen. MRI would be more sensitive for possible bony changes. Culture has grown Serratia Marcescens -- sensitive to Bactrim, ciprofloxacin, ceftazidime she was seen by Dr. Daylene [name] on  08/06/2016. He did not find any exposed bone, muscle, tendon, ligament or joint. There was no malodor and he did a excisional debridement in the office. ============ Old notes: 49 year old patient who is known to the wound clinic for a while had been away from the wound clinic since 09/01/2014. Over the last several months she has been admitted to various hospitals including [facility] [place]. She was treated for a right metatarsal osteomyelitis with a transmetatarsal amputation and this was done about 2 months ago. He has a small ulcerated area on the right heel and she continues to have an ulcerated area on the left plantar aspect of the foot. The patient was recently admitted to the [facility] between 7/12 and 10/18/2014. she was given 3 weeks of IV vancomycin and was to follow-up with her surgeons at [facility] and also took oral vancomycin for C. difficile colitis. 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 MRI of the left foot done on 10/14/2014 showed no abscess or osteomyelitis. 04/27/15; this is a patient we know from previous stays in the wound care center. She is a type I diabetic I am not sure of her control currently. Since the last time I saw her she is had a right transmetatarsal amputation and has no wounds on her right foot and has no open wounds. She is been followed at the wound care center at [facility] in [place]. She comes today with the desire to undergo hyperbaric treatment locally. Apparently one of her wound care providers in [place] has suggested hyperbarics. This is in response to an MRI from 04/18/15 that showed increased marrow signal and loss of the proximal  fifth metatarsal cortex evidence of osteomyelitis with likely early osteomyelitis in the cuboid bone as well. She has a large wound over the base of the fifth metatarsal. She also has a eschar over her the tips of her toes on 1,3 and 5. She does not have peripheral pulses and apparently is going for an angiogram tomorrow which seems reasonable. After this she is going to infectious disease at [facility]. They have been using Medihoney to the large wound on the lateral aspect of the left foot to. The patient has known Charcot deformity from diabetic neuropathy. She also has known diabetic PAD. Surprisingly I can't see that she has had any recent antibiotics, the patient states the last antibiotic she had was at the end of November for 10 days. I think this was in response to culture that showed group G strep although I'm not exactly sure where the culture was from. She is also had arterial studies on 03/29/15. This showed a right ABI of 1.4 that was noncompressible. Her left ABI was 0.73. 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 It was not felt that arterial inflow was adequate for healing of a foot ulcer. Her Doppler waveforms looked monophasic ===== READMISSION 02/28/17; this is in an now 49 year old woman we've had at several different occasions in this clinic. 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 She has  a remote ex-smoker. She was last seen in this clinic by Dr. Con Memos I think in May. More recently she is been followed by her podiatrist [name] an infectious disease Dr. Megan Salon. She has 2 open wounds the major one is over the right first metatarsal head she also has a wound on the left plantar foot. an MRI of the right foot on 01/01/17 showed a soft tissue ulcer along the plantar aspect of the first metatarsal base consistent with osteomyelitis of the first metatarsal stump. Dr. 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 According to the patient this is been open for slightly over a month. She has been on a combination of Cipro 500 twice a day, Zyvox 600 twice a day and Flagyl 500 3 times a day for over a month now as directed by Dr. Megan Salon. cultures of the right foot earlier this year showed MRSA in January and Serratia in May. January also had a few viridans strep. Recent x-rays of both feet were done and [name] office and I don't have these reports. The patient has known PAD and has a history of aleft femoropopliteal bypass in April 2017. 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 Dwyane Dee. In spite of this the last hemoglobin A1c I can see is 10.1 on 01/01/2017. 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 She is been using Medihoney to this area and also an area on the left midfoot. She is using healing sandals bilaterally. ABIs in this clinic at the left posterior tibial was 1.1 noncompressible on the right READMISSION Non invasive vascular NOVANT 5/18 Aftercare following surgery of the circulatory system Procedure Note - Interface, External Ris In - 08/13/2016 11:05 AM EDT Procedure: Examination consists of physiologic resting arterial pressures of the brachial and ankle arteries bilaterally with continuous wave Doppler waveform analysis. Previous: Previous exam performed on 02/14/16 demonstrated ABIs of Rt = 1.19 and Lt = 1.33. Right: ABI = non-compressible PT 1.47 DP. S/P transmet amputation. , Left: ABI = 1.52, 2nd digit pressure = 87 mmHg Conclusions: Right: ABI (>1.3) may be falsely elevated, suggesting medial calcification. Left: ABI (>1.3) may be falsely  elevated, suggesting medial calcification The patient is a now 49 year old type I diabetic is had multiple issues her graded to chronic diabetic foot ulcers. 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 We had her in the clinic lastin November. At that point she had wounds on her bilateral feet.she had wanted to try hyperbarics however the healogics review process denied her because she hadn't followed up with her vascular surgeon for her left femoropopliteal bypass. The bypass was done by Dr. Raul Del at [facility]. We made her a follow-up with Dr. Raul Del however she did not keep the appointment and therefore she was not approved The patient shows me a small wound on her left fourth metatarsal head on her phone. She developed rapid discoloration in the plantar aspect of the left foot and she was admitted to hospital from 2/2 through 05/10/17 with wet gangrene of the left foot osteomyelitis of the fourth metatarsal heads. She was admitted acutely ill with a temperature of 103.  She was started on broad-spectrum vancomycin and cefepime. On 05/06/17 she was taken to the OR by [name] her podiatric surgeon for an incision and drainage irrigation of the left foot wound. Cultures from this surgery revealed group be strep and anaerobes. she was seen by NancyXu of orthopedic surgery and scheduled for a below-knee amputation which she u refused. Ultimately she was discharged on Levaquin and Flagyl for one month. MRI 05/05/17 done while she was in the hospital showed abscess adjacent to the fourth metatarsal head and neck small abscess around the fourth flexor tendon. Inflammatory phlegmon and gas in the soft tissues along the lateral aspect of the fourth phalanx. Findings worrisome for osteomyelitis involving the fourth proximal and middle phalanx and also the third and fourth metatarsals. Finally the patient had actually shortly  before this followed up with Dr. Raul Del at no time on 04/29/17. He felt that her left femoropopliteal bypass was patent he felt that her left-sided toe pressures more than adequate for healing a wound on the left foot. This was before her acute presentation. 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 05/28/17; she is started hyperbarics. The patient tells me that for some reason she was not actually on Levaquin but I think on ciprofloxacin. She was on Flagyl. She only started her Levaquin yesterday due to some difficulty with the pharmacy and perhaps her sister picking it up. She has an appointment with [name] tomorrow and with infectious disease early next week. She has no new complaints 06/06/17; the patient continues in hyperbarics. She saw [name] on 05/29/17 who is her podiatric surgeon. He is elected for a transmetatarsal amputation on 06/27/17. I'm not sure at what level he plans to do this amputation. The patient is unaware She also saw Dr. Megan Salon of infectious disease who elected to continue her on current antibiotics I think this is ciprofloxacin and Flagyl. I'll need to clarify with her tomorrow if she actually has this. We're using silver alginate to the actual wound. Necrotic surface today with material under the flap of her foot. Original MRI showed abscesses as well as osteomyelitis of the proximal and middle fourth phalanx and the third and fourth metatarsal heads 06/11/17; patient continues in hyperbarics and continues on oral antibiotics. She is doing well. The wound looks better. The necrotic part of this under the flap in her superior foot also looks better. she is been to see [name]. I haven't had a chance to look at his note. Apparently he has put the transmetatarsal amputation on hold her request it is still planning to take her to the OR for debridement and product application ACEL. I'll see if I can find his note. I'll therefore leave product ordering/requests to [name]  for now. I was going to look at Dermagraft 06/18/17-she is here in follow-up evaluation for bilateral foot wounds. She continues with hyperbaric therapy. She states she has been applying manuka honey to the right plantar foot and alternate manuka honey and silver alginate to the left foot, despite our orders. We will continue with same treatment plan and she will follo up next week. 06/25/17; I have reviewed [name] last note from 3/11. She has operative debridement in 2 days' time. By review his note apparently they're going to place there is skin over the majority of this wound which is a good choice. She has a small satellite area at the most proximal part of this wound on the left plantar foot. The area on the right  plantar foot we've been using silver alginate and it is close to healing. 07/02/17; unfortunately the patient was not easily approved for [name] proposed surgery. I'm not completely certain what the issue is. She has been using silver alginate to the wound she has completed a first course of hyperbarics. She is still on Levaquin and Flagyl. I have really lost track of the time course here.I suspect she should have another week to 2 of antibiotics. I'll need to see if she is followed up with infectious disease Dr. Megan Salon 07/09/17; the patient is followed up with Dr. Megan Salon. 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 She continues on Levaquin and metronidazole continuing both of these for now I think she is been on fr about 6 weeks. She still has some drainage but no pain. No fever. Her had been plans for her to go to the OR for operative debridement with her podiatrist [name], I am not exactly sure where that is. I'll probably slip a note to [name] today. I note that she follows with Dr. Dwyane Dee of endocrinology. We have her recertified for hyperbaric oxygen. I have not heard about Dermagraft however I'll see if [name] is planning a  skin substitute as well 07/16/17; the patient tells me she is just about out of [place]. I'll need to check Dr. Hale Bogus last notes on this. She states she has plenty of Flagyl however. She comes in today complaining of pain in the right lateral foot which she said lasted for about a day. The wound on the right foot is actually much more medially. She also tells me that the [facility] cost a lot of pain in the left foot wound and she turned back to silver alginate. Finally Dermagraft has a $481 per application co-pay. She cannot afford this 07/23/17; patient arrives today with the wound not much smaller. There is not much new to add. She has not heard from [name] all try to put in a call to them today. She was asking about Dermagraft again and she has an over $856 per application co-pay she states that she would be willing to try to do a payment plan. I been tried to avoid this. We've been using silver alginate, I'll change to [facility] 07/30/17-She is here in follow-up evaluation for left foot ulcer. She continues hyperbaric medicine. The left foot ulcer is stable we will continue with same treatment plan 08/06/17; she is here for evaluation of her left foot ulcer. Currently being treated for hyperbarics or underlying osteomyelitis. She is completed antibiotics. The left foot ulcer is better smaller with healthier looking granulation. For various reasons I am not really clear on we never got her back to the OR with [name]. He did not respond to my secure text message. Nevertheless I think that surgery on this point is not necessary nor am I completely clear that a skin substitute is necessary The patient is complaining about pain on the outside of her right foot. 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 There is no erythema. She also states the foot is warm versus her other part of her upper leg and this is largely true. It is not totally clear to me what's causing this. She  thinks it's different from her usual neuropathy pain 08/13/17; she arrives in clinic today with a small wound which is superficial on her right first metatarsal head. 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 She tells [place] she was up on her feet over the Mother's Day celebration. The large wound is on the left foot. Continues with hyperbarics for underlying osteomyelitis. We're using Hydrofera Blue. She asked me today about where we were with Dermagraft. I had actually excluded this because of the co-pay however she wants to assume this therefore I'll recheck the co-pay an order for next week. 08/20/17; the patient agreed to accept the co-pay of the first Apligraf which we applied today. She is disappointed she is finishing hyperbarics will run this through the insurance on the extent of the foot infection and the extent of the wound that she had however she is already had 60 dive's. Dermagraft No. 1 08/27/17; Dermagraft No. 2. She is not eligible for any more hyperbaric treatments this month. She reports a fair amount of drainage and she actually changed to the external dressings without disturbing the direct contact layer 09/03/17; the patient arrived in clinic today with the wound superficially looking quite healthy. Nice vibrant red tissue with some advancing epithelialization although not as much adherence of the flap as I might like. However she noted on her own fourth toe some bogginess and she brought that to our attention. Indeed this was boggy feeling like a possibility of subcutaneous fluid. 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 She is not been unwell. We've been using Dermagraft 09/10/17; the culture that I did not last week was MRSA. She saw Dr. Megan Salon this morning who is going to start her on vancomycin. I had sent him a secure a text message yesterday. I also spoke with her podiatric surgeon  [name] about surgery on this foot the options for conserving a functional foot etc. Promised me he would see her and will make back consultation today. Paradoxically her actual wound on the plantar aspect of her left foot looks really quite good. I had given her 5 days worth of Baxdella to cover her for MRSA. Her MRI came back showing osteomyelitis within the third metatarsal shaft and head and base of the third and fourth proximal phalanx. She had extensive inflammatory changes throughout the soft tissue of the lateral forefoot. With an ill-defined fluid around the fourth metatarsal extending into the plantar and dorsal soft tissues 09/19/17; the patient is actually on oral Septra and Flagyl. She apparently refused IV vancomycin. She also saw [name] at my request who is planning her for a left BKA sometime in mid July. MRI showed osteomyelitis within the third metatarsal shaft and head and the basis of the third and fourth proximal phalanx. I believe there was felt to be possible septic arthritis involving the third MTP. 09/26/17; the patient went back to Dr. Megan Salon at my suggestion and is now receiving IV daptomycin. Her wound continues to look quite good making the decision to proceed with a transmetatarsal amputation although more difficult for the patient. I believe in my extensive discussions with her she has a good sense of the pros and cons of this. I don't NV the tuft decision she has to make. She has an appointment with [name] I believe in mid July and I previously spoken to him about this issue Has we had used 3 previous Dermagraft. Given the condition of the wound surface I went ahead and added the fourth one today area and I did this not fully realizing that she'll be traveling  to [place] next week. I'm hopeful she can come back in 2 weeks 10/21/17; Her same Dermagraft on for about 3-1/2 weeks. In spite of this the wound arrives looking quite healthy. There is been a lot of  healing dimensions are smaller. Looking at the square shaped wound she has now there is some undermining and some depth medially under the undermining although I cannot palpate any bone. No surrounding infection is obvious. She has difficult questions about how to look at this going forward vis--vis amputations versus continued medical therapy. T be truthful the wound is looks o so healthy and it is continued to contract. Hard to justify foot surgery at this point although I still told her that I think it might come to that if we are not able to eradicate the underlying MRSA. She is still highly at risk and she understands this 11/06/17 on evaluation today patient appears to be doing better in regard to her foot ulcer. She's been tolerating the dressing changes without complication. Currently she is here for her Dermagraft #6. Her wound continues to make excellent progress at this point. She does not appear to have any evidence of infection which is good news. 11/13/17 on evaluation today patient appears to be doing excellent at this time. She is here for repeat Dermagraft application. This is #7. Overall her wound seems to be making great progress. 12/05/17; the patient arrives with the wound in much better condition than when I last saw this almost 6 weeks ago. She still has a small probing area in the left metatarsal head region on the lateral aspect of her foot. We applied her last Dermagraft today. Since the last time she is here she has what appears to a been a blood blister on the plantar aspect of left foot although I don't see this is threatening. There is also a thick raised tissue on the right mid metatarsal head region. This was not there I don't think the last time she was here 3 weeks ago. 12/12/17; the patient continues to have a small programming area in the left metatarsal head region on the lateral aspect of her foot which was the initial large surgical wound. I applied her last Apligraf  last week. I'm going to use Endoform starting today Unfortunately she has an excoriated area in the left mid foot and the right mid foot. The left midfoot looks like a blistered area this was not opened last week it certainly is open today. Using silver alginate on these areas. She promises me she is offloading this. 12/19/17; the small probing area in the left metatarsal head eyes think is shallower. In general her original wound looks better. We've been using Endoform. The area inferiorly that I think was trauma last week still requires debridement a lot of nonviable surface which I removed. She still has an open open area distally in her foot Similarly on the right foot there is tightly adherent surface debris which I removed. Still areas that don't look completely epithelialized. This is a small open area. We used silver alginate on these areas 12/26/2017; the patient did not have the supplies we ordered from last week including the Endoform. The original large wound on the left lateral foot looks healthy. She still has the undermining area that is largely unchanged from last week. She has the same heavily callused raised edged wounds on the right mid and left midfoot. Both of these requiring debridement. We have been using silver alginate on these areas 01/02/2018;  there is still supply issues. We are going to try to use Prisma but I am not sure she actually got it from what she is saying. She has a new open area on the lateral aspect of the left fourth toe [previous fifth ray amputation]. Still the one tunneling area over the fourth metatarsal head. The area is in the midfoot bilaterally still have thick callus around them. She is concerned about a raised swelling on the lateral aspect of the foot. However she is completely insensate 01/10/2018; we are using Prisma to the wounds on her bilateral feet. Surprisingly the tunneling area over the left fourth metatarsal head that was part of  her original surgery has closed down. She has a small open area remaining on the incision line. 2 open areas in the midfoot. 02/10/2018; the patient arrives back in clinic after a month hiatus. She was traveling to visit family in [place]. Is fairly clear she was not offloading the areas on her feet. The original wound over the left lateral foot at the level of metatarsal heads is reopened and probes medially by about a centimeter or 2. She notes that a week ago she had purulent drainage come out of an area on the left midfoot. Paradoxically the worst area is actually on the right foot is extensive with purulent drainage. We will use silver alginate today 02/17/2018; the patient has 3 wounds one over the left lateral foot. She still has a small area over the metatarsal heads which is the remnant of her original surgical wound. This has medial probing depth of roughly 1.4 cm somewhat better than last week. The area on the right foot is larger. We have been using silver alginate to all areas. The area on the right foot and left foot that we cultured last week showed both Klebsiella and Proteus. Both of these are quinolone sensitive. The patient put her's self on Bactrim and Flagyl that she had left hanging around from prior antibiotic usages. She was apparently on this last week when she arrived. I did not realize this. Unfortunately the Bactrim will not cover either 1 of these organisms. We will send in Cipro 500 twice daily for a week 03/04/2018; the patient has 2 wounds on the left foot one is the original wound which was a surgical wound for a deep DFU. At one point this had exposed bone. She still has an area over the fourth metatarsal head that probes about 1.4 cm although I think this is better than last week. I been using silver nitrate to try and promote tissue adherence and been using silver alginate here. She also has an area in the left midfoot. This has some depth but a small linear wound.  Still requiring debridement. On the right midfoot is a circular wound. A lot of thick callus around this area. We have been using silver alginate to all wound areas She is completed the ciprofloxacin I gave her 2 weeks ago. 03/11/2018; the patient continues to have 2 open areas on the left foot 1 of which was the original surgical wound for a deep DFU. Only a small probing area remains although this is not much different from last week we have been using silver alginate. The other area is on the midfoot this is smaller linear but still with some depth. We have been using silver alginate here as well On the right foot she has a small circular wound in the mid aspect. This is not much smaller than last  time. We have been using silver alginate here as well 03/18/2018; she has 3 wounds on the left foot the original surgical wound, a very superficial wound in the mid aspect and then finally the area in the mid plantar foot. She arrives in today with a very concerning area in the wound in the mid plantar foot which is her most proximal wound. There is undermining here of roughly 1-1/2 cm superiorly. Serosanguineous drainage. She tells me she had some pain on for over the weekend that shot up her foot into her thigh and she tells me that she had a nodule in the groin area. She has the single wound in the right foot. We are using endoform to both wound areas 03/24/2018; the patient arrives with the original surgical wound in the area on the left midfoot about the same as last week. There is a collection of fluid under the surface of the skin extending from the surgical wound towards the midfoot although it does not reach the midfoot wound. The area on the right foot is about the same. Cultures from last week of the left midfoot wound showed abundant Klebsiella abundant Enterococcus faecalis and moderate methicillin resistant staph I gave her Levaquin but this would have only covered the Klebsiella. She  will need linezolid 04/01/2018; she is taking linezolid but for the first few days only took 1 a day. I have advised her to finish this at twice daily dosing. In any case all of her wounds are a lot better especially on the left foot. The original surgical wound is closed. The area on the left midfoot considerably smaller. The area on the right foot also smaller. 04/08/2018; her original surgical wound/osteomyelitis on the left foot remains closed. She has area on the left foot that is in the midfoot area but she had some streaking towards this. This is not connected with her original wound at least not visually. Small wound on the right midfoot appears somewhat smaller. 04/15/18; both wounds looks better. Original wound is better left midfoot. Using silver alginate 1/21; patient states she uses saltwater soak in, stones or remove callus from around her wounds. She is also concerned about a blood blister she had on the left foot but it simply resolved on its own. We've been using silver alginate 1/28; the patient arrives today with the same streaking area from her metatarsals laterally [the site of her original surgical wound] down to the middle of her foot. There is some drainage in the subcutaneous area here. This concerns me that there is actually continued ongoing infection in the metatarsals probably the fourth and third. This fixates an MRI of the foot without contrast [chronic renal failure] The wound in the mid part of the foot is small but I wonder whether this area actually connects with the more distal foot. The area on the right midfoot is probably about the same. Callus thick skin around the small wound which I removed with a curette we have been using silver alginate on both wound areas 2/4; culture I did of the draining site on the left foot last time grew methicillin sensitive staph aureus. MRI of the left foot showed interval resolution of the findings surrounding the third metatarsal  joint on the prior study consistent with treated osteomyelitis. Chronic soft tissue ulceration in the plantar and lateral aspect of the forefoot without residual focal fluid collection. No evidence of recurrent osteomyelitis. Noted to have the previous amputation of the distal first phalanx and fifth  ray MRI of the right foot showed no evidence of osteomyelitis I am going to treat the patient with a prolonged course of antibiotics directed against MSSA in the left foot 2/11; patient continues on cephalexin. She tells me she had nausea and vomiting over the weekend and missed 2 days. In general her foot looks much the same. She has a small open area just below the left fourth metatarsal head. A linear area in the left midfoot. Some discoloration extending from the inferior part of this into the left lateral foot although this appears to be superficial. She has a small area on the right midfoot which generally looks smaller after debridement 2/18; the patient is completing his cephalexin and has another 2 days. She continues to have open areas on the left and right foot. 2/25; she is now off antibiotics. The area on the left foot at the site of her original surgical wound has closed yet again. She still has open areas in the mid part of her foot however these appear smaller. The area on the right mid foot looks about the same. We have been using silver alginate She tells me she had a serious hypoglycemic spell at home. She had to have EMS called and get IV dextrose 3/3; disappointing on the left lateral foot large area of necrotic tissue surrounding the linear area. This appears to track up towards the same original surgical wound. Required extensive debridement. The area on the right plantar foot is not a lot better also using silver 3/12; the culture I did last time showed abundant enterococcus. I have prescribed Augmentin, should cover any unrecognized anaerobes as well. In addition there were a few  MRSA and Serratia that would not be well covered although I did not want to give her multiple antibiotics. She comes in today with a new wound in the right midfoot this is not connected with the original wound over her MTP a lot of thick callus tissue around both wounds but once again she said she is not walking on these areas 3/17-Patient comes in for follow-up on the bilateral plantar wounds, the right midfoot and the left plantar wound. Both these are heavily callused surrounding the wounds. We are continuing to use silver alginate, she is compliant with offloading and states she uses a wheelchair fairly often at home 3/24; both wound areas have thick callus. However things actually look quite a bit better here for the majority of her left foot and the right foot. 3/31; patient continues to have thick callused somewhat irritated looking tissue around the wounds which individually are fairly superficial. There is no evidence of surrounding infection. We have been using silver alginate however I change that to [facility] today 4/17; patient returns to clinic after having a scare with Covid she tested negative in her primary doctor's office. She has been using Hydrofera Blue. She does not have an open area on the right foot. On the left foot she has a small open area with the mid area not completely viable. She showed me pictures of what looks like a hemorrhagic blister from several days ago but that seems to have healed over this was on the lateral left foot 4/21; patient comes in to clinic with both her wounds on her feet closed. However over the weekend she started having pain in her right foot and leg up into the thigh. She felt as though she was running a low-grade fever but did not take her temperature. She took a  doxycycline that she had leftover and yesterday a single Septra and metronidazole. She thinks things feel somewhat better. 4/28; duplex ultrasound I ordered last week was negative  for DVT or superficial thrombophlebitis. She is completed the doxycycline I gave her. States she is still having a lot of pain in the right calf and right ankle which is no better than last week. She cannot sleep. She also states she has a temperature of up to 101, coughing and complaining of visual loss in her bilateral eyes. Apparently she was tested for Covid 2 weeks ago at [facility] and that was negative. Readmission: 09/03/18 patient presents back for reevaluation after having been evaluated at the end of April regarding erythema and swelling of her right lower extremity. Subsequently she ended up going to the hospital on 07/29/18 and was admitted not to be discharged until 08/08/18. Unfortunately it was noted during the time that she was in the hospital that she did have methicillin-resistant Staphylococcus aureus as the infection noted at the site. It was also determined that she did have osteomyelitis which appears to be fairly significant. She was treated with vancomycin and in fact is still on IV vancomycin at dialysis currently. This is actually slated to continue until 09/12/18 at least which will be the completion of the six weeks of therapy. Nonetheless based on what I'm seeing at this point I'm not sure she will be anywhere near ready to discontinue antibiotics at that time. Since she was released from the hospital she was seen by [name] who is her podiatrist on 08/27/18. His note specifically states that he is recommended that the patient needs of one knee amputation on the right as she has a life- threatening situation that can lead quickly to sepsis. The patient advised she would like to try to save her leg to which [name] apparently told her that this was against all medical advice. She also want to discontinue the Wound VAC which had been initiated due to the fact that she wasn't pleased with how the wound was looking and subsequently she wanted to pursue applying Medihoney at that time.  He stated that he did not believe that the right lower extremity was salvageable and that the patient understood but would still like to attempt hyperbaric option therapy if it could be of any benefit. She was therefore referred back to [place] for further evaluation. He plans to see her back next week. Upon inspection today patient has a significant amount purulent drainage noted from the wound at this point. The bone in the distal portion of her foot also appears to be extremely necrotic and spongy. When I push down on the bone it bubbles and seeps purulent drainage from deeper in the end of the foot. I do not think that this is likely going to heal very well at all and less aggressive surgical debridement were undertaken more than what I believe we can likely do here in our office. 09/12/2018; I have not seen this patient since the most recent hospitalization although she was in our clinic last week. I have reviewed some of her records from a complex hospitalization. 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 There is situation was complicated by MRSA bacteremia and acute on chronic renal failure now on dialysis. She is receiving vancomycin at dialysis. We started her on Dakin's wet-to-dry last week she is changing this daily. There is still purulent drainage coming out of her foot.  Although she is apparently "agreeable" to a below-knee amputation which is been suggested by multiple clinicians she wants this to be done in [place]. She apparently has a telehealth visit with that provider sometime in late Tye 6/24. I have told her I think this is probably too long. Nevertheless I could not convince her to allow a local doctor to perform BKA. 09/19/2018; the patient has a large necrotic area on the right anterior foot. She has had previous transmetatarsal amputations. Culture I did last week showed MRSA nothing else she is on vancomycin at dialysis. She  has continued leaking purulent drainage out of the distal part of the large circular wound on the right anterior foot. She apparently went to see Dr. 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 Somehow that translated into her being referred to plastic surgery for debridement of the area. I gather she basically refused amputation although I do not have a copy of Dr. Berenice Primas notes. The patient really wants to have a trial of hyperbaric oxygen. I agreed with initial assessment in this clinic that this was probably too far along to benefit however if she is going to have plastic surgery I think she would benefit from ancillary hyperbaric oxygen. The issue here is that the patient has benefited as maximally as any patient I have ever seen from hyperbaric oxygen therapy. Most recently she had exposed bone on the lateral part of her left foot after a surgical procedure and that actually has closed. She has eschared areas in both heels but no open area. She is remained systemically well. I am not optimistic that anything can be done about this but the patient is very clear that she wants an attempt. The attempt would include a wound VAC further debridements and hyperbaric oxygen along with IV antibiotics. 6/26; I put her in for a trial of hyperbaric oxygen only because of the dramatic response she has had with wounds on her left midfoot earlier this year which was a surgical wound that went straight to her bone over the metatarsal heads and also remotely the left third toe. We will see if we can get this through our review process and insurance. She arrives in clinic with again purulent material pouring out of necrotic bone on the top of the foot distally. There is also some concerning erythema on the front of the leg that we marked. It is bit difficult to tell how tender this is because of neuropathy. I note from infectious disease that she had her vancomycin extended. All the  cultures of these areas have shown MRSA sensitive to vancomycin. She had the wound VAC on for part of the week. The rest of the time she is putting various things on this including Medihoney, "ionized water" silver sorb gel etc. 7/7; follow-up along with HBO. She is still on vancomycin at dialysis. She has a large open area on the dorsal right foot and a small dark eschar area on her heel. There is a lot less erythema in the area and a lot less tenderness. From an infection point of view I think this is better. She still has a lot of necrosis in the remaining right forefoot [previous TMA] we are still using the wound VAC in this area 7/16; follow-up along with HBO. I put her on linezolid after she finished her vancomycin. We started this last Friday I gave her 2 weeks worth. I had the expectation that she would be operatively debrided by Dr. Marla Roe  but that still has not happened yet. Patient phoned the office this week. She arrives for review today after HBO. The distal part of this wound is completely necrotic. Nonviable pieces of tendon bone was still purulent drainage. Also concerning that she has black eschar over the heel that is expanding. I think this may be indicative of infection in this area as well. She has less erythema and warmth in the ankle and calf but still an abnormal exam 7/21 follow-up along with HBO. I will renew her linezolid after checking a CBC with differential monitoring her blood counts especially her platelets. She was supposed to have surgery yesterday but if I am reading things correctly this was canceled after her blood sugar was found to be over 500. I thought Dr. Marla Roe who called me said that they were sending her to the ER but the patient states that was not the case. 7/28. Follow-up along with HBO. She is on linezolid I still do not have any lab work from dialysis even though I called last week. The patient is concerned about an area on her left lateral  foot about the level of the base of her fifth metatarsal. I did not really see anything that ominous here however this patient is in [place] ability to point out problems that she is sensing and she has been accurate in the past Finally she received a call from Dr. Marla Roe who is referring her to another orthopedic surgeon stating that she is too booked up to take her to the operating room now. Was still using a wound VAC on the foot 8/3 -Follow-up after HBO, she is got another week of linezolid, she is to call ID for an appointment, x-rays of both feet were reviewed, the left foot x-ray with third MTP joint osteo- Right foot x-ray widespread osteo-in the right midfoot Right ankle x-ray does not show any active evidence of infection 8/11-Patient is seen after HBO, the wounds on the right foot appear to be about the same, the heel wound had some necrotic base over tendon that was debrided with a curette 8/21; patient is seen after HBO. The patient's wound on her dorsal foot actually looks reasonably good and there is substantial amount of epithelialization however the open area distally still has a lot of necrotic debris partially bone. I cannot really get a good sense of just how deep this probes under the foot. She has been pressuring me this week to order medical maggots through a company in [place] for her. The problem I have is there is not a defined wound area here. On the positive side there is no purulence. She has been to see infectious disease she is still on Septra DS although I have not had a chance to review their notes 8/28; patient is seen in conjunction with HBO. The wounds on her foot continued to improve including the right dorsal foot substantially the, the distal part of this wound and the area on the right heel. We have been using a wound VAC over this chronically. She is still on trimethoprim as directed by infectious disease 9/4; patient is seen in conjunction with HBO.  Right dorsal foot wound substantially anteriorly is better however she continues to have a deep wound in the distal part of this that is not responding. We have been using silver collagen under border foam Area on the right plantar medial heel seems better. We have been using Hydrofera Blue 12/12/18 on evaluation today patient appears to be  doing about the same with regard to her wound based on prior measurements. She does have some necrotic tissue noted on the lateral aspect of the wound that is going require a little bit of sharp debridement today. This includes what appears to be potentially either severely necrotic bone or tendon. Nonetheless other than that she does not appear to have any severe infection which is good news 9/18; it is been 2 weeks since I saw this wound. She is tolerating HBO well. Continued dramatic improvement in the area on the right dorsal foot. She still has a small wound on the heel that we have been using Hydrofera Blue. She continues with a wound VAC 9/24; patient has to be seen emergently today with a swelling on her right lateral lower leg. She says that she told Dr. Evette Doffing about this and also myself on a couple of occasions but I really have no recollection of this. She is not systemically unwell and her wound really looked good the last time I saw this. She showed this to providers at dialysis and she was able to verify that she was started on cephalexin today for 5 doses at dialysis. She dialyzes on Tuesday Thursday and Saturday. 10/2; patient is seen in conjunction with HBO. The area that is draining on the right anterior medial tibia is more extensive. Copious amounts of serosanguineous drainage with some purulence. We are still using the wound VAC on the original wound then it is stable. Culture I did of the original IandD showed MRSA I contacted dialysis she is now on vancomycin with dialysis treatments. I asked them to run a month 10/9; patient seen in  conjunction with HBO. She had a new spontaneous open area just above the wound on the right medial tibia ankle. More swelling on the right medial tibia. Her wound on the foot looks about the same perhaps slightly better. There is no warmth spreading up her leg but no obvious erythema. her MRI of the foot and ankle and distal tib-fib is not booked for next Friday I discussed this with her in great detail over multiple days. it is likely she has spreading infection upper leg at least involving the distal 25% above the ankle. She knows that if I refer her to orthopedics for infectious disease they are going to recommend amputation and indeed I am not against this myself. We had a good trial at trying to heal the foot which is what she wanted along with antibiotics debridement and HBO however she clearly has spreading infection [probably staph aureus/MRSA]. Nevertheless she once again tells me she wants to wait the left of the MRI. She still makes comments about having her amputation done in [place]. 10/19; arrives today with significant swelling on the lateral right leg. Last culture I did showed Klebsiella. Multidrug-resistant. Cipro was intermediate sensitivity and that is what I have her on pending her MRI which apparently is going to be done on Thursday this week although this seems to be moving back and forth. She is not systemically unwell. We are using silver alginate on her major wound area on the right medial foot and the draining areas on the right lateral lower leg 10/26; MRI showed extensive abscess in the anterior compartment of the right leg also widespread osteomyelitis involving osseous structures of the midfoot and portions of the hindfoot. Also suspicion for osteomyelitis anterior aspect of the distal medial malleolus. Culture I did of the purulence once again showed a multidrug-resistant Klebsiella. I have been  in contact with nephrology late last week and she has been started on  cefepime at dialysis to replace the vancomycin We sent a copy of her MRI report to Dr. Geroge Baseman in [place] who is an orthopedic surgeon. The patient takes great stock in his opinion on this. She says she will go to [place] to have her leg amputated if Dr. Geroge Baseman does not feel there is any salvage options. 11/2; she still is not talk to her orthopedic surgeon in [place]. Apparently he will call her at [phone] this afternoon. The quality of this is she has not allowed me to refer her anywhere. She has been told over and over that she needs this amputated but has not agreed to be referred. She tells me her blood sugar was 600 last night but she has not been febrile. 11/9; she never did got a call from the orthopedic surgeon in [place] therefore that is off the radar. We have arranged to get her see orthopedic surgery at [facility]. She still has a lot of draining purulence coming out of the new abscess in her right leg although that probably came from the osteomyelitis in her right foot and heel. Meanwhile the original wound on the right foot looks very healthy. Continued improvement. The issue is that the last MRI showed osteomyelitis in her right foot extensively she now has an abscess in the right anterior lower leg. There is nobody in [place] who will offer this woman anything but an amputation and to be honest that is probably what she needs. I think she still wants to talk about limb salvage although at this point I just do not see that. She has completed her vancomycin at dialysis which was for the original staph aureus she is still on cefepime for the more recent Klebsiella. She has had a long course of both of these antibiotics which should have benefited the osteomyelitis on the right foot as well as the abscess. 11/16; apparently [place] elective surgery is shut down because of COVID-19 pandemic. I have reached out to some contacts at [facility] to see if we can get her an  orthopedic appointment there. I am concerned about continually leaving this but for the moment everything is static. In fact her original large wound on this foot is closing down. It is the abscess on the right anterior leg that continues to drain purulent serosanguineous material. She is not currently on any antibiotics however she had a prolonged course of vancomycin [1 month] as well as cefepime for a month 02/24/2019 on evaluation today patient appears to be doing better than the last time I saw her. This is not a patient that I typically see. With that being said I am covering for Dr. Dellia Nims this week and again compared to when I last saw her overall the wounds in particular seem to be doing significantly better which is good news. With that being said the patient tells me several disconcerting things. She has not been able to get in to see anyone for potential debridement in regard to her leg wounds although she tells me that she does not think it is necessary any longer because she is taking care of that herself. She noticed a string coming out of the lower wound on her leg over the last week. The patient states that she subsequently decided that we must of pack something in there and started pulling the string out and as it kept coming and coming she realized this was likely her  tendon. With that being said she continued to remove as much of this as she could. She then I subsequently proceeded to using tubes of antibiotic ointment which she will stick down into the wound and then scored as much as she can until she sees it coming out of the other wound opening. She states that in doing this she is actually made things better and there is less redness and irritation. With regard to her foot wound she does have some necrotic tendon and tissue noted in one small corner but again the actual wound itself seems to be doing better with good granulation in general compared to my last evaluation. 12/7;  continued improvement in the wound on the substantial part of the right medial foot. Still a necrotic area inferiorly that required debridement but the rest of this looks very healthy and is contracting. She has 2 wounds on the right lateral leg which were her original drainage sites from her abscess but all of this looks a lot better as well. She has been using silver alginate after putting antibiotic biotic ointment in one wound and watching it come out the other. I have talked to her in some detail today. I had given her names of orthopedic surgeons at [facility] for second opinion on what to do about the right leg. I do not think the patient never called them. She has not been able to get a hold of the orthopedic surgeon in [place] that she had put a lot of faith in as being somebody would give her an opinion that she would trust. I talked to her today and said even if I could get her in to another orthopedic surgeon about the leg which she accept an amputation and she said she would not therefore I am not going to press this issue for the moment 12/14; continued improvement in his substantial wound on the right medial foot. There is still a necrotic area inferiorly with tightly adherent necrotic debris which I have been working on debriding each time she is here. She does not have an orthopedic appointment. Since last time she was here I looked over her cultures which were essentially MRSA on the foot wound and gram-negative rods in the abscess on the anterior leg. 12/21; continued improvement in the area on the right medial foot. She is not up on this much and that is probably a good thing since I do not know it could support continuous ambulation. She has a small area on the right lateral leg which were remanence of the IandD's I did because of the abscess. I think she should probably have prophylactic antibiotics I am going to have to look this over to see if we can make an intelligent  decision here. In the meantime her major wound is come down nicely. Necrotic area inferiorly is still there but looks a lot better 04/06/2019; she has had some improvement in the overall surface area on the right medial foot somewhat narrowedr both but somewhat longer. The areas on the right lateral leg which were initial IandD sites are superficial. Nothing is present on the right heel. We are using silver alginate to the wound areas 1/18; right medial foot somewhat smaller. Still a deep probing area in the most distal recess of the wound. She has nothing open on the right leg. She has a new wound on the plantar aspect of her left fourth toe which may have come from just pulling skin. The patient using Medihoney on  the wound on her foot under silver alginate. I cannot discourage her from this 2/1; 2-week follow-up using silver alginate on the right foot and her left fourth toe. The area on the right dorsal foot is contracted although there is still the deep area in the most distal part of the wound but still has some probing depth. No overt infection 2/15; 2-week follow-up. She continues to have improvement in the surface area on the dorsal right foot. Even the tunneling area from last time is almost closed. The area that was on the plantar part of her left fourth toe over the PIP is indeed closed 3/1; 2-week follow-up. Continued improvement in surface area. The original divot that we have been debriding inferiorly I think has full epithelialization although the epithelialization is gone down into the wound with probably 4 mm of depth. Even under intense illumination I am unable to see anything open here. The remanence of the wound in this area actually look quite healthy. We have been using silver alginate 3/15; 2-week follow-up. Unfortunately not as good today. She has a comma shaped wound on the dorsal foot however the upper part of this is larger. Under illumination debris on the surface She also  tells [place] that she was on her right leg 2 times in the last couple of weeks mostly to reach up for things above her head etc. She felt a sharp pain in the right leg which she thinks is somewhere from the ankle to the knee. The patient has neuropathy and is really uncertain. She cannot feel her foot so she does not think it was coming from there 3/29; 2-week follow-up. Her wound measures smaller. Surface of the wound appears reasonable. She is using silver alginate with underlying Medihoney. She has home health. X-rays I did of her tib-fib last time were negative although it did show arterial calcification 4/12; 2-week follow-up. Her wound measures smaller in length. Using manuka honey with silver alginate on top. She has home health. 4/26; 2-week follow-up. Her wound is smaller but still very adherent debris under illumination requiring debridement she has been using manuka honey with silver alginate. 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 Been using Hydrofera Blue. She is on suppressive Bactrim but apparently she has been taking it twice daily 6/7; I have not seen this wound and about 6 weeks. Since then she was up in [place]. By her own admission she was walking on the foot because she did not have a wheelchair. The wound is not nearly as healthy looking as it was the last time I saw this. We ordered different things for her but she only uses Medihoney and silver alginate. As far as I know she is on suppressive trimethoprim sulfamethoxazole. She does not admit to any fever or chills. Her CBGs apparently are at baseline however she is saying that she feels some discomfort on the lateral part of her ankle I looked over her last inflammatory markers from the summer 2020 at which time she had a deeply necrotic infected wound in this area. On 11/10/2018 her sedimentation rate was 56 and C-reactive  protein 9.9. This was 107 and 29 on 07/29/2018. 6/17; the patient had a necrotic wound the last time she was here on the right dorsal foot. After debridement I did a culture. This showed a very resistant ESBL Klebsiella as well as Enterococcus. Her  x-ray of the foot which was done because of warmth and some discomfort showed bone destruction within the carpal bones involving the navicular acute cuboid lateral middle cuneiforms but essentially unchanged from her prior study which was done on 10/29/2018. The findings were felt to represent chronic osteomyelitis. We did inflammatory markers on her. Her white count was 5.25 sedimentation rate 16 and C-reactive protein at 11.1. Notable for the fact that in August 2020 her CRP was 9.9 and sedimentation rate 56. I have looked at her x-rays. It is true that the bone destruction is very impressive however the patient came into this clinic for the wound on her right foot with pieces of bone literally falling out anteriorly with purulent material. I am not exactly sure I could have expected anything different. She has not been systemically unwell no fever chills or blood sugars have been reasonable. 6/28; she arrives with a right heel closed. The substantial area on the right anterior foot looks healthy. Much better looking surface. I think we can change to [facility] seems to help this previously. She is getting her antibiotics at dialysis she should be just about finished 7/9; changed to [facility] last week. Surface wound looks satisfactory not much change in surface area however. She is going to [place] state next week this is usually a difficult thing for this patient follow-up will be for 2 weeks. 7/23; using Hydrofera Blue. She returns from her trip and the wound looks surprisingly good. Usually when this patient goes on trips she comes back with a lot of problems with the wound. She is saying that she sometimes feels an episodic "crunching"  feeling on the lateral part of the foot. She is neuropathic and not feeling pain but wonders whether this could be a neuropathic dysesthesia. 11/13/19-Patient returns after 3 weeks, the wound itself is stable and patient states that there is nothing new going on she is on some extra anxiety medications and is resisting the temptation to pick at the dry skin around the wound. 9/20; patient has not been here in over a month and I have not seen her in 2 months. The wound in terms of size I think is about the same. There is no exposed bone. She has a nonviable surface on this. She is supposed to be using [facility] however she is also been using some form of honey preparation as well as a silver-based dressings. I do not think she has any pattern to this. 10/4; 2-week follow-up. Patient has been using some form of spray which she says has honey and silver to purchase this online she has been covering it with gauze. In spite of this the wound actually looks quite good. The deeper divot distally appears to be close down. There is a rim of epithelialization. 10/18; 2-week follow-up. Patient has been using her Hydrofera Blue covered with her silver honey spray that she got online. 11/1; 2-week follow-up. She is using Hydrofera Blue with a silver honey spray. Wound bed is measuring smaller. She has noticed that her foot is warmer on the right. She is concerned about infection. For a long period of time I had her on prophylactic trimethoprim sulfamethoxazole DS 1 tablet daily. She is asking for this to be restarted. The patient is walking on this foot because of repairs that are being done in a home her but her room is on the second floor she has to go up and down stairs. I have cautioned against this however as  usual she will do exactly what she wants to do Electronic Signature(s) Signed: 02/01/2020 5:37:06 PM By: Linton Ham MD Entered By: Linton Ham on 02/01/2020  13:30:50 -------------------------------------------------------------------------------- Physical Exam Details Patient Name: Date of Service: [name] Nancy L. 02/01/2020 12:45 PM Medical Record Number: [id number] Patient Account Number: [id number] Date of Birth/Sex: Treating RN: 1970-09-26 (49 y.o. [name] Primary Care Provider: Sanjuana [name], [name] Other Clinician: Referring Provider: Treating Provider/Extender: [name], [name] Weeks in Treatment: 81 Constitutional Patient is hypertensive.. Pulse regular and within target range for patient.Marland Kitchen Respirations regular, non-labored and within target range.. Temperature is normal and within the target range for the patient.Marland Kitchen Appears in no distress. Cardiovascular Pedal pulses palpable on the right. Notes Wound exam; the wound is contracting nicely. She still has an inferior divot that is probably still open a bit with slight amount of drainage she experiences every now and then. Her foot is warmer than the left. Electronic Signature(s) Signed: 02/01/2020 5:37:06 PM By: Linton Ham MD Entered By: Linton Ham on 02/01/2020 13:32:09 -------------------------------------------------------------------------------- Physician Orders Details Patient Name: Date of Service: [name] 02/01/2020 12:45 PM Medical Record Number: [id number] Patient Account Number: [id number] Date of Birth/Sex: Treating RN: [birth year] (49 y.o. [name] Primary Care Provider: Sanjuana [name], [name] Other Clinician: Referring Provider: Treating Provider/Extender: [name], [name] Weeks in Treatment: 61 Verbal / Phone Orders: No Diagnosis Coding ICD-10 Coding Code Description M86.671 Other chronic osteomyelitis, right ankle and foot L97.514 Non-pressure chronic ulcer of other part of right foot with necrosis of bone L97.411 Non-pressure chronic ulcer of right heel and midfoot limited to breakdown of  skin E10.621 Type 1 diabetes mellitus with foot ulcer Follow-up Appointments Return Appointment in 2 weeks. Dressing Change Frequency Wound #43 Right,Medial Foot Change Dressing every other day. Wound Cleansing Wound #43 Right,Medial Foot Clean wound with Wound Cleanser - or normal saline Primary Wound Dressing Wound #43 Right,Medial Foot Hydrofera Blue Secondary Dressing Wound #43 Right,Medial Foot Kerlix/Rolled Gauze Dry Gauze ABD pad Other: - ACE wrap Edema Control Elevate legs to the level of the heart or above for 30 minutes daily and/or when sitting, a frequency of: - throughout the day [facility] skilled [facility] for wound care. - Interim Patient Medications llergies: Cleocin, lisinopril A Notifications Medication Indication Start End wouind infection 02/01/2020 sulfamethoxazole-trimethoprim DOSE oral 400 mg-80 mg tablet - 1 tablet oral q24h, give after dialysis on dialysis days Electronic Signature(s) Signed: 02/01/2020 1:37:08 PM By: Linton Ham MD Entered By: Linton Ham on 02/01/2020 13:37:05 -------------------------------------------------------------------------------- Problem List Details Patient Name: Date of Service: [name] 02/01/2020 12:45 PM Medical Record Number: [id number] Patient Account Number: [id number] Date of Birth/Sex: Treating RN: [birth year] (49 y.o. [name] Primary Care Provider: Sanjuana [name], [name] Other Clinician: Referring Provider: Treating Provider/Extender: [name], [name] Weeks in Treatment: 57 Active Problems ICD-10 Encounter Code Description Active Date MDM Code Description Active Date MDM Diagnosis M86.671 Other chronic osteomyelitis, right ankle and foot 09/03/2018 No Yes L97.514 Non-pressure chronic ulcer of other part of right foot with necrosis of bone 09/03/2018 No Yes L97.411 Non-pressure chronic ulcer of right heel and midfoot limited to breakdown of  09/17/2019 No Yes skin E10.621 Type 1 diabetes mellitus with foot ulcer 09/24/2018 No Yes Inactive Problems ICD-10 Code Description Active Date Inactive Date L97.521 Non-pressure chronic ulcer of other part of left  foot limited to breakdown of skin 04/20/2019 04/20/2019 L97.812 Non-pressure chronic ulcer of other part of right lower leg with fat layer exposed 02/24/2019 02/24/2019 Resolved Problems ICD-10 Code Description Active Date Resolved Date L02.415 Cutaneous abscess of right lower limb 12/25/2018 12/25/2018 Electronic Signature(s) Signed: 02/01/2020 5:37:06 PM By: Linton Ham MD Entered By: Linton Ham on 02/01/2020 13:28:31 -------------------------------------------------------------------------------- Progress Note Details Patient Name: Date of Service: [name] 02/01/2020 12:45 PM Medical Record Number: [id number] Patient Account Number: [id number] Date of Birth/Sex: Treating RN: 1970-06-05 (49 y.o. [name] Primary Care Provider: Sanjuana [name], [name] Other Clinician: Referring Provider: Treating Provider/Extender: [name], [name] Weeks in Treatment: 96 Subjective History of Present Illness (HPI) 49 year old diabetic who is known to have type 1 diabetes which is poorly controlled last hemoglobin A1c was 11%. She comes in with a ulcerated area on the left lateral foot which has been there for over 6 months. Was recently she has been treated by [name] of podiatry who saw her last on 05/28/2016. Review of his notes revealed that the patient had incision and drainage with placement of antibiotic beads to the left foot on 04/11/2016 for possible osteomyelitis of the cuboid bone. Over the last year she's had a history of amputation of the left fifth toe and a femoropopliteal popliteal bypass graft somewhere in April 2017. 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 His note [name] mentions that the patient has been  referred to me for further wound care and possibly great candidate for hyperbaric oxygen therapy due to recurrent osteomyelitis. However we do not have any x-rays of biopsy reports confirming this. He has been on several antibiotics including Bactrim and most recently is on doxycycline for an MRSA. I understand, the patient was not a candidate for IV antibiotics as she has had previous PICC lines which resulted in blood clots in both arms. There was a x-ray report dated 04/04/2016 on [name] notes which showed evidence of fifth ray resection left foot with osteolytic changes noted to the fourth metatarsal and cuboid bone on the left. 06/13/2016 -- had a left foot x-ray which showed no acute fracture or dislocation and no definite radiographic evidence of osteomyelitis. Advanced osteopenia was seen. 06/20/2016 -- she has noticed a new wound on the right plantar foot in the region where she had a callus before. 06/27/16- the patient did have her x-ray of the right foot which showed no findings to suggest osteomyelitis. She saw her endocrinologist, [name], yesterday. Her A1c in January was 11. He also indicates mismanagement and noncompliance regarding her diabetes. She is currently on Bactrim for a lip infection. She is complaining of nausea, vomiting and diarrhea. She is unable to articulate the exact orders or dosing of the Bactrim; it is unclear when she will complete this. 07/04/2016 -- results from [facility] of ABIs with ankle waveforms were noted from 02/14/2016. The examination done on 06/27/2015 showed noncompressible ABIs with the right being 1.45 and the left being 1.33. The present examination showed a right ABI of 1.19 on the left of 1.33. The conclusion was that right normal ABI in the lower extremity at rest however compared to previous study which was noncompressible ABI may be falsely elevated side suggesting medial calcification. The left ABI suggested medial  calcification. 08/01/2016 -- the patient had more redness and pain on her right foot and did not get to come to see as noted she see her  PCP or go to the ER and decided to take some leftover metronidazole which she had at home. As usual, the patient does report she feels and is rather noncompliant. 08/08/2016 -- -- x-ray of the right foot -- FINDINGS:Transmetatarsal amputation is noted. No bony destruction is noted to suggest osteomyelitis. IMPRESSION: No evidence of osteomyelitis. Postsurgical changes are seen. MRI would be more sensitive for possible bony changes. Culture has grown Serratia Marcescens -- sensitive to Bactrim, ciprofloxacin, ceftazidime she was seen by Dr. Daylene [name] on 08/06/2016. He did not find any exposed bone, muscle, tendon, ligament or joint. There was no malodor and he did a excisional debridement in the office. ============ Old notes: 49 year old patient who is known to the wound clinic for a while had been away from the wound clinic since 09/01/2014. Over the last several months she has been admitted to various hospitals including [facility]. She was treated for a right metatarsal osteomyelitis with a transmetatarsal amputation and this was done about 2 months ago. He has a small ulcerated area on the right heel and she continues to have an ulcerated area on the left plantar aspect of the foot. The patient was recently admitted to the [facility] hospital group between 7/12 and 10/18/2014. she was given 3 weeks of IV vancomycin and was to follow-up with her surgeons at [facility] and also took oral vancomycin for C. difficile colitis. 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 MRI of the left foot done on 10/14/2014 showed no abscess or osteomyelitis. 04/27/15; this is a patient we know from previous stays in the wound care center. She is a type I diabetic I am not sure of her control currently. Since the last time I saw her she is had a right transmetatarsal amputation and has no wounds on her right foot and has no open wounds. She is been followed at the wound care center at [facility] in [place]. She comes today with the desire to undergo hyperbaric treatment locally. Apparently one of her wound care providers in [place] has suggested hyperbarics. This is in response to an MRI from 04/18/15 that showed increased marrow signal and loss of the proximal fifth metatarsal cortex evidence of osteomyelitis with likely early osteomyelitis in the cuboid bone as well. She has a large wound over the base of the fifth metatarsal. She also has a eschar over her the tips of her toes on 1,3 and 5. She does not have peripheral pulses and apparently is going for an angiogram tomorrow which seems reasonable. After this she is going to infectious disease at [facility]. They have been using Medihoney to the large wound on the lateral aspect of the left foot to. The patient has known Charcot deformity from diabetic neuropathy. She also has known diabetic PAD. Surprisingly I can't see that she has had any recent antibiotics, the patient states the last antibiotic she had was at the end of November for 10 days. I think this was in response to culture that showed group G strep although I'm not exactly sure where the culture was from. She is also had arterial studies on 03/29/15. This showed a right ABI of 1.4 that was noncompressible. Her left ABI was 0.73. 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 It  was not felt that arterial inflow was adequate for healing of a foot ulcer. Her Doppler waveforms looked monophasic ===== READMISSION 02/28/17; this  is in an now 49 year old woman we've had at several different occasions in this clinic. 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 She has a remote ex-smoker. She was last seen in this clinic by Dr. Con Memos I think in May. More recently she is been followed by her podiatrist [name] an infectious disease Dr. Megan Salon. She has 2 open wounds the major one is over the right first metatarsal head she also has a wound on the left plantar foot. an MRI of the right foot on 01/01/17 showed a soft tissue ulcer along the plantar aspect of the first metatarsal base consistent with osteomyelitis of the first metatarsal stump. Dr. 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 According to the patient this is been open for slightly over a month. She has been on a combination of Cipro 500 twice a day, Zyvox 600 twice a day and Flagyl 500 3 times a day for over a month now as directed by Dr. Megan Salon. cultures of the right foot earlier this year showed MRSA in January and Serratia in May. January also had a few viridans strep. Recent x-rays of both feet were done and [name] office and I don't have these reports. The patient has known PAD and has a history of aleft femoropopliteal bypass in April 2017. 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 Dwyane Dee. In spite of this the last hemoglobin A1c I can see is 10.1 on 01/01/2017. 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 She is been using Medihoney to this area and also an area on the left midfoot. She is using healing sandals bilaterally. ABIs in this clinic at the left posterior tibial was 1.1  noncompressible on the right READMISSION Non invasive vascular NOVANT 5/18 Aftercare following surgery of the circulatory system Procedure Note - Interface, External Ris In - 08/13/2016 11:05 AM EDT Procedure: Examination consists of physiologic resting arterial pressures of the brachial and ankle arteries bilaterally with continuous wave Doppler waveform analysis. Previous: Previous exam performed on 02/14/16 demonstrated ABIs of Rt = 1.19 and Lt = 1.33. Right: ABI = non-compressible PT 1.47 DP. S/P transmet amputation. , Left: ABI = 1.52, 2nd digit pressure = 87 mmHg Conclusions: Right: ABI (>1.3) may be falsely elevated, suggesting medial calcification. Left: ABI (>1.3) may be falsely elevated, suggesting medial calcification The patient is a now 49 year old type I diabetic is had multiple issues her graded to chronic diabetic foot ulcers. 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 We had her in the clinic lastin November. At that point she had wounds on her bilateral feet.she had wanted to try hyperbarics however the healogics review process denied her because she hadn't followed up with her vascular surgeon for her left femoropopliteal bypass. The bypass was done by Dr. Raul Del at [facility]. We made her a follow-up with Dr. Raul Del however she did not keep the appointment and therefore she was not approved The patient shows me a small wound  on her left fourth metatarsal head on her phone. She developed rapid discoloration in the plantar aspect of the left foot and she was admitted to hospital from 2/2 through 05/10/17 with wet gangrene of the left foot osteomyelitis of the fourth metatarsal heads. She was admitted acutely ill with a temperature of 103. She was started on broad-spectrum vancomycin and cefepime. On 05/06/17 she was taken to the OR by [name] her podiatric surgeon for an incision and drainage  irrigation of the left foot wound. Cultures from this surgery revealed group be strep and anaerobes. she was seen by NancyXu of orthopedic surgery and scheduled for a below-knee amputation which she u refused. Ultimately she was discharged on Levaquin and Flagyl for one month. MRI 05/05/17 done while she was in the hospital showed abscess adjacent to the fourth metatarsal head and neck small abscess around the fourth flexor tendon. Inflammatory phlegmon and gas in the soft tissues along the lateral aspect of the fourth phalanx. Findings worrisome for osteomyelitis involving the fourth proximal and middle phalanx and also the third and fourth metatarsals. Finally the patient had actually shortly before this followed up with Dr. Raul Del at no time on 04/29/17. He felt that her left femoropopliteal bypass was patent he felt that her left-sided toe pressures more than adequate for healing a wound on the left foot. This was before her acute presentation. 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 05/28/17; she is started hyperbarics. The patient tells me that for some reason she was not actually on Levaquin but I think on ciprofloxacin. She was on Flagyl. She only started her Levaquin yesterday due to some difficulty with the pharmacy and perhaps her sister picking it up. She has an appointment with [name] tomorrow and with infectious disease early next week. She has no new complaints 06/06/17; the patient continues in hyperbarics. She saw [name] on 05/29/17 who is her podiatric surgeon. He is elected for a transmetatarsal amputation on 06/27/17. I'm not sure at what level he plans to do this amputation. The patient is unaware ooShe also saw Dr. Megan Salon of infectious disease who elected to continue her on current antibiotics I think this is ciprofloxacin and Flagyl. I'll need to clarify with her tomorrow if she actually has this. We're using silver alginate to the actual wound. Necrotic surface today with  material under the flap of her foot. ooOriginal MRI showed abscesses as well as osteomyelitis of the proximal and middle fourth phalanx and the third and fourth metatarsal heads 06/11/17; patient continues in hyperbarics and continues on oral antibiotics. She is doing well. The wound looks better. The necrotic part of this under the flap in her superior foot also looks better. she is been to see [name]. I haven't had a chance to look at his note. Apparently he has put the transmetatarsal amputation on hold her request it is still planning to take her to the OR for debridement and product application ACEL. I'll see if I can find his note. I'll therefore leave product ordering/requests to [name] for now. I was going to look at Dermagraft 06/18/17-she is here in follow-up evaluation for bilateral foot wounds. She continues with hyperbaric therapy. She states she has been applying manuka honey to the right plantar foot and alternate manuka honey and silver alginate to the left foot, despite our orders. We will continue with same treatment plan and she will follo up next week. 06/25/17; I have reviewed [name] last note from 3/11. She  has operative debridement in 2 days' time. By review his note apparently they're going to place there is skin over the majority of this wound which is a good choice. She has a small satellite area at the most proximal part of this wound on the left plantar foot. The area on the right plantar foot we've been using silver alginate and it is close to healing. 07/02/17; unfortunately the patient was not easily approved for [name] proposed surgery. I'm not completely certain what the issue is. She has been using silver alginate to the wound she has completed a first course of hyperbarics. She is still on Levaquin and Flagyl. I have really lost track of the time course here.I suspect she should have another week to 2 of antibiotics. I'll need to see if she is followed up with  infectious disease Dr. Megan Salon 07/09/17; the patient is followed up with Dr. Megan Salon. 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 She continues on Levaquin and metronidazole continuing both of these for now I think she is been on fr about 6 weeks. She still has some drainage but no pain. No fever. Her had been plans for her to go to the OR for operative debridement with her podiatrist [name], I am not exactly sure where that is. I'll probably slip a note to [name] today. I note that she follows with Dr. Dwyane Dee of endocrinology. We have her recertified for hyperbaric oxygen. I have not heard about Dermagraft however I'll see if [name] is planning a skin substitute as well 07/16/17; the patient tells me she is just about out of [place]. I'll need to check Dr. Hale Bogus last notes on this. She states she has plenty of Flagyl however. She comes in today complaining of pain in the right lateral foot which she said lasted for about a day. The wound on the right foot is actually much more medially. She also tells me that the [facility] cost a lot of pain in the left foot wound and she turned back to silver alginate. Finally Dermagraft has a $938 per application co-pay. She cannot afford this 07/23/17; patient arrives today with the wound not much smaller. There is not much new to add. She has not heard from [name] all try to put in a call to them today. She was asking about Dermagraft again and she has an over $182 per application co-pay she states that she would be willing to try to do a payment plan. I been tried to avoid this. We've been using silver alginate, I'll change to [facility] 07/30/17-She is here in follow-up evaluation for left foot ulcer. She continues hyperbaric medicine. The left foot ulcer is stable we will continue with same treatment plan 08/06/17; she is here for evaluation of her left foot ulcer. Currently being treated for  hyperbarics or underlying osteomyelitis. She is completed antibiotics. The left foot ulcer is better smaller with healthier looking granulation. For various reasons I am not really clear on we never got her back to the OR with [name]. He did not respond to my secure text message. Nevertheless I think that surgery on this point is not necessary nor am I completely clear that a skin substitute is necessary The patient is complaining about pain on the outside of her right foot. 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 There is no erythema. She also states the foot is warm versus her other  part of her upper leg and this is largely true. It is not totally clear to me what's causing this. She thinks it's different from her usual neuropathy pain 08/13/17; she arrives in clinic today with a small wound which is superficial on her right first metatarsal head. 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 She tells [place] she was up on her feet over the Mother's Day celebration. ooThe large wound is on the left foot. Continues with hyperbarics for underlying osteomyelitis. We're using Hydrofera Blue. She asked me today about where we were with Dermagraft. I had actually excluded this because of the co-pay however she wants to assume this therefore I'll recheck the co-pay an order for next week. 08/20/17; the patient agreed to accept the co-pay of the first Apligraf which we applied today. She is disappointed she is finishing hyperbarics will run this through the insurance on the extent of the foot infection and the extent of the wound that she had however she is already had 60 dive's. Dermagraft No. 1 08/27/17; Dermagraft No. 2. She is not eligible for any more hyperbaric treatments this month. She reports a fair amount of drainage and she actually changed to the external dressings without disturbing the direct contact layer 09/03/17; the patient arrived in clinic today with the wound  superficially looking quite healthy. Nice vibrant red tissue with some advancing epithelialization although not as much adherence of the flap as I might like. However she noted on her own fourth toe some bogginess and she brought that to our attention. Indeed this was boggy feeling like a possibility of subcutaneous fluid. 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 She is not been unwell. We've been using Dermagraft 09/10/17; the culture that I did not last week was MRSA. She saw Dr. Megan Salon this morning who is going to start her on vancomycin. I had sent him a secure a text message yesterday. I also spoke with her podiatric surgeon [name] about surgery on this foot the options for conserving a functional foot etc. Promised me he would see her and will make back consultation today. Paradoxically her actual wound on the plantar aspect of her left foot looks really quite good. I had given her 5 days worth of Baxdella to cover her for MRSA. Her MRI came back showing osteomyelitis within the third metatarsal shaft and head and base of the third and fourth proximal phalanx. She had extensive inflammatory changes throughout the soft tissue of the lateral forefoot. With an ill-defined fluid around the fourth metatarsal extending into the plantar and dorsal soft tissues 09/19/17; the patient is actually on oral Septra and Flagyl. She apparently refused IV vancomycin. She also saw [name] at my request who is planning her for a left BKA sometime in mid July. MRI showed osteomyelitis within the third metatarsal shaft and head and the basis of the third and fourth proximal phalanx. I believe there was felt to be possible septic arthritis involving the third MTP. 09/26/17; the patient went back to Dr. Megan Salon at my suggestion and is now receiving IV daptomycin. Her wound continues to look quite good making the decision to proceed with a  transmetatarsal amputation although more difficult for the patient. I believe in my extensive discussions with her she has a good sense of the pros and cons of this. I don't NV the tuft decision she has to make. She  has an appointment with [name] I believe in mid July and I previously spoken to him about this issue Has we had used 3 previous Dermagraft. Given the condition of the wound surface I went ahead and added the fourth one today area and I did this not fully realizing that she'll be traveling to [place] next week. I'm hopeful she can come back in 2 weeks 10/21/17; Her same Dermagraft on for about 3-1/2 weeks. In spite of this the wound arrives looking quite healthy. There is been a lot of healing dimensions are smaller. Looking at the square shaped wound she has now there is some undermining and some depth medially under the undermining although I cannot palpate any bone. No surrounding infection is obvious. She has difficult questions about how to look at this going forward vis--vis amputations versus continued medical therapy. T be truthful the wound is looks so o healthy and it is continued to contract. Hard to justify foot surgery at this point although I still told her that I think it might come to that if we are not able to eradicate the underlying MRSA. She is still highly at risk and she understands this 11/06/17 on evaluation today patient appears to be doing better in regard to her foot ulcer. She's been tolerating the dressing changes without complication. Currently she is here for her Dermagraft #6. Her wound continues to make excellent progress at this point. She does not appear to have any evidence of infection which is good news. 11/13/17 on evaluation today patient appears to be doing excellent at this time. She is here for repeat Dermagraft application. This is #7. Overall her wound seems to be making great progress. 12/05/17; the patient arrives with the wound in much better  condition than when I last saw this almost 6 weeks ago. She still has a small probing area in the left metatarsal head region on the lateral aspect of her foot. We applied her last Dermagraft today. ooSince the last time she is here she has what appears to a been a blood blister on the plantar aspect of left foot although I don't see this is threatening. There is also a thick raised tissue on the right mid metatarsal head region. This was not there I don't think the last time she was here 3 weeks ago. 12/12/17; the patient continues to have a small programming area in the left metatarsal head region on the lateral aspect of her foot which was the initial large surgical wound. I applied her last Apligraf last week. I'm going to use Endoform starting today ooUnfortunately she has an excoriated area in the left mid foot and the right mid foot. The left midfoot looks like a blistered area this was not opened last week it certainly is open today. Using silver alginate on these areas. She promises me she is offloading this. 12/19/17; the small probing area in the left metatarsal head eyes think is shallower. In general her original wound looks better. We've been using Endoform. The area inferiorly that I think was trauma last week still requires debridement a lot of nonviable surface which I removed. She still has an open open area distally in her foot ooSimilarly on the right foot there is tightly adherent surface debris which I removed. Still areas that don't look completely epithelialized. This is a small open area. We used silver alginate on these areas 12/26/2017; the patient did not have the supplies we ordered from last week including the Endoform. The  original large wound on the left lateral foot looks healthy. She still has the undermining area that is largely unchanged from last week. She has the same heavily callused raised edged wounds on the right mid and left midfoot. Both of these requiring  debridement. We have been using silver alginate on these areas 01/02/2018; there is still supply issues. We are going to try to use Prisma but I am not sure she actually got it from what she is saying. She has a new open area on the lateral aspect of the left fourth toe [previous fifth ray amputation]. Still the one tunneling area over the fourth metatarsal head. The area is in the midfoot bilaterally still have thick callus around them. She is concerned about a raised swelling on the lateral aspect of the foot. However she is completely insensate 01/10/2018; we are using Prisma to the wounds on her bilateral feet. Surprisingly the tunneling area over the left fourth metatarsal head that was part of her original surgery has closed down. She has a small open area remaining on the incision line. 2 open areas in the midfoot. 02/10/2018; the patient arrives back in clinic after a month hiatus. She was traveling to visit family in [place]. Is fairly clear she was not offloading the areas on her feet. The original wound over the left lateral foot at the level of metatarsal heads is reopened and probes medially by about a centimeter or 2. She notes that a week ago she had purulent drainage come out of an area on the left midfoot. Paradoxically the worst area is actually on the right foot is extensive with purulent drainage. We will use silver alginate today 02/17/2018; the patient has 3 wounds one over the left lateral foot. She still has a small area over the metatarsal heads which is the remnant of her original surgical wound. This has medial probing depth of roughly 1.4 cm somewhat better than last week. The area on the right foot is larger. We have been using silver alginate to all areas. The area on the right foot and left foot that we cultured last week showed both Klebsiella and Proteus. Both of these are quinolone sensitive. The patient put her's self on Bactrim and Flagyl that she had left hanging  around from prior antibiotic usages. She was apparently on this last week when she arrived. I did not realize this. Unfortunately the Bactrim will not cover either 1 of these organisms. We will send in Cipro 500 twice daily for a week 03/04/2018; the patient has 2 wounds on the left foot one is the original wound which was a surgical wound for a deep DFU. At one point this had exposed bone. She still has an area over the fourth metatarsal head that probes about 1.4 cm although I think this is better than last week. I been using silver nitrate to try and promote tissue adherence and been using silver alginate here. ooShe also has an area in the left midfoot. This has some depth but a small linear wound. Still requiring debridement. ooOn the right midfoot is a circular wound. A lot of thick callus around this area. ooWe have been using silver alginate to all wound areas ooShe is completed the ciprofloxacin I gave her 2 weeks ago. 03/11/2018; the patient continues to have 2 open areas on the left foot 1 of which was the original surgical wound for a deep DFU. Only a small probing area remains although this is not much different from  last week we have been using silver alginate. The other area is on the midfoot this is smaller linear but still with some depth. We have been using silver alginate here as well ooOn the right foot she has a small circular wound in the mid aspect. This is not much smaller than last time. We have been using silver alginate here as well 03/18/2018; she has 3 wounds on the left foot the original surgical wound, a very superficial wound in the mid aspect and then finally the area in the mid plantar foot. She arrives in today with a very concerning area in the wound in the mid plantar foot which is her most proximal wound. There is undermining here of roughly 1-1/2 cm superiorly. Serosanguineous drainage. She tells me she had some pain on for over the weekend that shot up her  foot into her thigh and she tells me that she had a nodule in the groin area. ooShe has the single wound in the right foot. ooWe are using endoform to both wound areas 03/24/2018; the patient arrives with the original surgical wound in the area on the left midfoot about the same as last week. There is a collection of fluid under the surface of the skin extending from the surgical wound towards the midfoot although it does not reach the midfoot wound. The area on the right foot is about the same. Cultures from last week of the left midfoot wound showed abundant Klebsiella abundant Enterococcus faecalis and moderate methicillin resistant staph I gave her Levaquin but this would have only covered the Klebsiella. She will need linezolid 04/01/2018; she is taking linezolid but for the first few days only took 1 a day. I have advised her to finish this at twice daily dosing. In any case all of her wounds are a lot better especially on the left foot. The original surgical wound is closed. The area on the left midfoot considerably smaller. The area on the right foot also smaller. 04/08/2018; her original surgical wound/osteomyelitis on the left foot remains closed. She has area on the left foot that is in the midfoot area but she had some streaking towards this. This is not connected with her original wound at least not visually. ooSmall wound on the right midfoot appears somewhat smaller. 04/15/18; both wounds looks better. Original wound is better left midfoot. Using silver alginate 1/21; patient states she uses saltwater soak in, stones or remove callus from around her wounds. She is also concerned about a blood blister she had on the left foot but it simply resolved on its own. We've been using silver alginate 1/28; the patient arrives today with the same streaking area from her metatarsals laterally [the site of her original surgical wound] down to the middle of her foot. There is some drainage in  the subcutaneous area here. This concerns me that there is actually continued ongoing infection in the metatarsals probably the fourth and third. This fixates an MRI of the foot without contrast [chronic renal failure] ooThe wound in the mid part of the foot is small but I wonder whether this area actually connects with the more distal foot. ooThe area on the right midfoot is probably about the same. Callus thick skin around the small wound which I removed with a curette we have been using silver alginate on both wound areas 2/4; culture I did of the draining site on the left foot last time grew methicillin sensitive staph aureus. MRI of the left foot showed  interval resolution of the findings surrounding the third metatarsal joint on the prior study consistent with treated osteomyelitis. Chronic soft tissue ulceration in the plantar and lateral aspect of the forefoot without residual focal fluid collection. No evidence of recurrent osteomyelitis. 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 She tells me she had nausea and vomiting over the weekend and missed 2 days. In general her foot looks much the same. She has a small open area just below the left fourth metatarsal head. A linear area in the left midfoot. Some discoloration extending from the inferior part of this into the left lateral foot although this appears to be superficial. She has a small area on the right midfoot which generally looks smaller after debridement 2/18; the patient is completing his cephalexin and has another 2 days. She continues to have open areas on the left and right foot. 2/25; she is now off antibiotics. The area on the left foot at the site of her original surgical wound has  closed yet again. She still has open areas in the mid part of her foot however these appear smaller. The area on the right mid foot looks about the same. We have been using silver alginate She tells me she had a serious hypoglycemic spell at home. She had to have EMS called and get IV dextrose 3/3; disappointing on the left lateral foot large area of necrotic tissue surrounding the linear area. This appears to track up towards the same original surgical wound. Required extensive debridement. The area on the right plantar foot is not a lot better also using silver 3/12; the culture I did last time showed abundant enterococcus. I have prescribed Augmentin, should cover any unrecognized anaerobes as well. In addition there were a few MRSA and Serratia that would not be well covered although I did not want to give her multiple antibiotics. She comes in today with a new wound in the right midfoot this is not connected with the original wound over her MTP a lot of thick callus tissue around both wounds but once again she said she is not walking on these areas 3/17-Patient comes in for follow-up on the bilateral plantar wounds, the right midfoot and the left plantar wound. Both these are heavily callused surrounding the wounds. We are continuing to use silver alginate, she is compliant with offloading and states she uses a wheelchair fairly often at home 3/24; both wound areas have thick callus. However things actually look quite a bit better here for the majority of her left foot and the right foot. 3/31; patient continues to have thick callused somewhat irritated looking tissue around the wounds which individually are fairly superficial. There is no evidence of surrounding infection. We have been using silver alginate however I change that to [facility] today 4/17; patient returns to clinic after having a scare with Covid she tested negative in her primary doctor's office. She has been using Hydrofera  Blue. She does not have an open area on the right foot. On the left foot she has a small open area with the mid area not completely viable. She showed me pictures of what looks like a hemorrhagic blister from several days ago but that seems to have healed over this was on  the lateral left foot 4/21; patient comes in to clinic with both her wounds on her feet closed. However over the weekend she started having pain in her right foot and leg up into the thigh. She felt as though she was running a low-grade fever but did not take her temperature. She took a doxycycline that she had leftover and yesterday a single Septra and metronidazole. She thinks things feel somewhat better. 4/28; duplex ultrasound I ordered last week was negative for DVT or superficial thrombophlebitis. She is completed the doxycycline I gave her. States she is still having a lot of pain in the right calf and right ankle which is no better than last week. She cannot sleep. She also states she has a temperature of up to 101, coughing and complaining of visual loss in her bilateral eyes. Apparently she was tested for Covid 2 weeks ago at [facility] and that was negative. Readmission: 09/03/18 patient presents back for reevaluation after having been evaluated at the end of April regarding erythema and swelling of her right lower extremity. Subsequently she ended up going to the hospital on 07/29/18 and was admitted not to be discharged until 08/08/18. Unfortunately it was noted during the time that she was in the hospital that she did have methicillin-resistant Staphylococcus aureus as the infection noted at the site. It was also determined that she did have osteomyelitis which appears to be fairly significant. She was treated with vancomycin and in fact is still on IV vancomycin at dialysis currently. This is actually slated to continue until 09/12/18 at least which will be the completion of the six weeks of therapy. Nonetheless based on what I'm  seeing at this point I'm not sure she will be anywhere near ready to discontinue antibiotics at that time. Since she was released from the hospital she was seen by [name] who is her podiatrist on 08/27/18. His note specifically states that he is recommended that the patient needs of one knee amputation on the right as she has a life- threatening situation that can lead quickly to sepsis. The patient advised she would like to try to save her leg to which [name] apparently told her that this was against all medical advice. She also want to discontinue the Wound VAC which had been initiated due to the fact that she wasn't pleased with how the wound was looking and subsequently she wanted to pursue applying Medihoney at that time. He stated that he did not believe that the right lower extremity was salvageable and that the patient understood but would still like to attempt hyperbaric option therapy if it could be of any benefit. She was therefore referred back to [place] for further evaluation. He plans to see her back next week. Upon inspection today patient has a significant amount purulent drainage noted from the wound at this point. The bone in the distal portion of her foot also appears to be extremely necrotic and spongy. When I push down on the bone it bubbles and seeps purulent drainage from deeper in the end of the foot. I do not think that this is likely going to heal very well at all and less aggressive surgical debridement were undertaken more than what I believe we can likely do here in our office. 09/12/2018; I have not seen this patient since the most recent hospitalization although she was in our clinic last week. I have reviewed some of her records from a complex hospitalization. 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 There is situation was complicated by MRSA bacteremia and acute on chronic renal failure now on dialysis. She is receiving  vancomycin at dialysis. We started her on Dakin's wet-to-dry last week she is changing this daily. There is still purulent drainage coming out of her foot. Although she is apparently "agreeable" to a below-knee amputation which is been suggested by multiple clinicians she wants this to be done in [place]. She apparently has a telehealth visit with that provider sometime in late [place] 6/24. I have told her I think this is probably too long. Nevertheless I could not convince her to allow a local doctor to perform BKA. 09/19/2018; the patient has a large necrotic area on the right anterior foot. She has had previous transmetatarsal amputations. Culture I did last week showed MRSA nothing else she is on vancomycin at dialysis. She has continued leaking purulent drainage out of the distal part of the large circular wound on the right anterior foot. She apparently went to see Dr. 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 Somehow that translated into her being referred to plastic surgery for debridement of the area. I gather she basically refused amputation although I do not have a copy of Dr. Berenice Primas notes. The patient really wants to have a trial of hyperbaric oxygen. I agreed with initial assessment in this clinic that this was probably too far along to benefit however if she is going to have plastic surgery I think she would benefit from ancillary hyperbaric oxygen. The issue here is that the patient has benefited as maximally as any patient I have ever seen from hyperbaric oxygen therapy. Most recently she had exposed bone on the lateral part of her left foot after a surgical procedure and that actually has closed. She has eschared areas in both heels but no open area. She is remained systemically well. I am not optimistic that anything can be done about this but the patient is very clear that she wants an attempt. The attempt would include a wound VAC further  debridements and hyperbaric oxygen along with IV antibiotics. 6/26; I put her in for a trial of hyperbaric oxygen only because of the dramatic response she has had with wounds on her left midfoot earlier this year which was a surgical wound that went straight to her bone over the metatarsal heads and also remotely the left third toe. We will see if we can get this through our review process and insurance. She arrives in clinic with again purulent material pouring out of necrotic bone on the top of the foot distally. There is also some concerning erythema on the front of the leg that we marked. It is bit difficult to tell how tender this is because of neuropathy. I note from infectious disease that she had her vancomycin extended. 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 She had the wound VAC on for part of the week. The rest of the time she is putting various things on this including Medihoney, "ionized water" silver sorb gel etc. 7/7; follow-up along with HBO. She is still on vancomycin at dialysis. She has a large open area on the dorsal right foot and a small dark eschar area on her heel. There is a lot less erythema in the area and a lot less tenderness. From an infection point of view I think this is better. She still has a lot of necrosis in the  remaining right forefoot [previous TMA] we are still using the wound VAC in this area 7/16; follow-up along with HBO. I put her on linezolid after she finished her vancomycin. We started this last Friday I gave her 2 weeks worth. I had the expectation that she would be operatively debrided by Dr. Marla Roe but that still has not happened yet. Patient phoned the office this week. She arrives for review today after HBO. The distal part of this wound is completely necrotic. Nonviable pieces of tendon bone was still purulent drainage. Also concerning that she has black eschar over the heel that is expanding. I think this may  be indicative of infection in this area as well. She has less erythema and warmth in the ankle and calf but still an abnormal exam 7/21 follow-up along with HBO. I will renew her linezolid after checking a CBC with differential monitoring her blood counts especially her platelets. She was supposed to have surgery yesterday but if I am reading things correctly this was canceled after her blood sugar was found to be over 500. I thought Dr. Marla Roe who called me said that they were sending her to the ER but the patient states that was not the case. 7/28. Follow-up along with HBO. She is on linezolid I still do not have any lab work from dialysis even though I called last week. The patient is concerned about an area on her left lateral foot about the level of the base of her fifth metatarsal. I did not really see anything that ominous here however this patient is in [place] ability to point out problems that she is sensing and she has been accurate in the past Finally she received a call from Dr. Marla Roe who is referring her to another orthopedic surgeon stating that she is too booked up to take her to the operating room now. Was still using a wound VAC on the foot 8/3 -Follow-up after HBO, she is got another week of linezolid, she is to call [facility] for an appointment, x-rays of both feet were reviewed, the left foot x-ray with third MTP joint osteo- Right foot x-ray widespread osteo-in the right midfoot Right ankle x-ray does not show any active evidence of infection 8/11-Patient is seen after HBO, the wounds on the right foot appear to be about the same, the heel wound had some necrotic base over tendon that was debrided with a curette 8/21; patient is seen after HBO. The patient's wound on her dorsal foot actually looks reasonably good and there is substantial amount of epithelialization however the open area distally still has a lot of necrotic debris partially bone. I cannot really get a good  sense of just how deep this probes under the foot. She has been pressuring me this week to order medical maggots through a company in [place] for her. The problem I have is there is not a defined wound area here. On the positive side there is no purulence. She has been to see infectious disease she is still on Septra DS although I have not had a chance to review their notes 8/28; patient is seen in conjunction with HBO. The wounds on her foot continued to improve including the right dorsal foot substantially the, the distal part of this wound and the area on the right heel. We have been using a wound VAC over this chronically. She is still on trimethoprim as directed by infectious disease 9/4; patient is seen in conjunction with HBO. Right dorsal foot wound  substantially anteriorly is better however she continues to have a deep wound in the distal part of this that is not responding. We have been using silver collagen under border foam ooArea on the right plantar medial heel seems better. We have been using Hydrofera Blue 12/12/18 on evaluation today patient appears to be doing about the same with regard to her wound based on prior measurements. She does have some necrotic tissue noted on the lateral aspect of the wound that is going require a little bit of sharp debridement today. This includes what appears to be potentially either severely necrotic bone or tendon. Nonetheless other than that she does not appear to have any severe infection which is good news 9/18; it is been 2 weeks since I saw this wound. She is tolerating HBO well. Continued dramatic improvement in the area on the right dorsal foot. She still has a small wound on the heel that we have been using Hydrofera Blue. She continues with a wound VAC 9/24; patient has to be seen emergently today with a swelling on her right lateral lower leg. She says that she told Dr. Evette Doffing about this and also myself on a couple of occasions but I  really have no recollection of this. She is not systemically unwell and her wound really looked good the last time I saw this. She showed this to providers at dialysis and she was able to verify that she was started on cephalexin today for 5 doses at dialysis. She dialyzes on Tuesday Thursday and Saturday. 10/2; patient is seen in conjunction with HBO. The area that is draining on the right anterior medial tibia is more extensive. Copious amounts of serosanguineous drainage with some purulence. We are still using the wound VAC on the original wound then it is stable. Culture I did of the original IandD showed MRSA I contacted dialysis she is now on vancomycin with dialysis treatments. I asked them to run a month 10/9; patient seen in conjunction with HBO. She had a new spontaneous open area just above the wound on the right medial tibia ankle. More swelling on the right medial tibia. Her wound on the foot looks about the same perhaps slightly better. There is no warmth spreading up her leg but no obvious erythema. her MRI of the foot and ankle and distal tib-fib is not booked for next Friday I discussed this with her in great detail over multiple days. it is likely she has spreading infection upper leg at least involving the distal 25% above the ankle. She knows that if I refer her to orthopedics for infectious disease they are going to recommend amputation and indeed I am not against this myself. We had a good trial at trying to heal the foot which is what she wanted along with antibiotics debridement and HBO however she clearly has spreading infection [probably staph aureus/MRSA]. Nevertheless she once again tells me she wants to wait the left of the MRI. She still makes comments about having her amputation done in [place]. 10/19; arrives today with significant swelling on the lateral right leg. Last culture I did showed Klebsiella. Multidrug-resistant. Cipro was intermediate sensitivity and  that is what I have her on pending her MRI which apparently is going to be done on Thursday this week although this seems to be moving back and forth. She is not systemically unwell. We are using silver alginate on her major wound area on the right medial foot and the draining areas on the right lateral  lower leg 10/26; MRI showed extensive abscess in the anterior compartment of the right leg also widespread osteomyelitis involving osseous structures of the midfoot and portions of the hindfoot. Also suspicion for osteomyelitis anterior aspect of the distal medial malleolus. Culture I did of the purulence once again showed a multidrug-resistant Klebsiella. I have been in contact with nephrology late last week and she has been started on cefepime at dialysis to replace the vancomycin We sent a copy of her MRI report to Dr. Geroge Baseman in [place] who is an orthopedic surgeon. The patient takes great stock in his opinion on this. She says she will go to [place] to have her leg amputated if Dr. Geroge Baseman does not feel there is any salvage options. 11/2; she still is not talk to her orthopedic surgeon in [place]. Apparently he will call her at [phone] this afternoon. The quality of this is she has not allowed me to refer her anywhere. She has been told over and over that she needs this amputated but has not agreed to be referred. She tells me her blood sugar was 600 last night but she has not been febrile. 11/9; she never did got a call from the orthopedic surgeon in [place] therefore that is off the radar. We have arranged to get her see orthopedic surgery at [facility]. She still has a lot of draining purulence coming out of the new abscess in her right leg although that probably came from the osteomyelitis in her right foot and heel. Meanwhile the original wound on the right foot looks very healthy. Continued improvement. The issue is that the last MRI showed osteomyelitis in her right foot extensively  she now has an abscess in the right anterior lower leg. There is nobody in [place] who will offer this woman anything but an amputation and to be honest that is probably what she needs. I think she still wants to talk about limb salvage although at this point I just do not see that. She has completed her vancomycin at dialysis which was for the original staph aureus she is still on cefepime for the more recent Klebsiella. She has had a long course of both of these antibiotics which should have benefited the osteomyelitis on the right foot as well as the abscess. 11/16; apparently [place] elective surgery is shut down because of COVID-19 pandemic. I have reached out to some contacts at [facility] to see if we can get her an orthopedic appointment there. I am concerned about continually leaving this but for the moment everything is static. In fact her original large wound on this foot is closing down. It is the abscess on the right anterior leg that continues to drain purulent serosanguineous material. She is not currently on any antibiotics however she had a prolonged course of vancomycin [1 month] as well as cefepime for a month 02/24/2019 on evaluation today patient appears to be doing better than the last time I saw her. This is not a patient that I typically see. With that being said I am covering for Dr. Dellia Nims this week and again compared to when I last saw her overall the wounds in particular seem to be doing significantly better which is good news. With that being said the patient tells me several disconcerting things. She has not been able to get in to see anyone for potential debridement in regard to her leg wounds although she tells me that she does not think it is necessary any longer because she is taking care  of that herself. She noticed a string coming out of the lower wound on her leg over the last week. The patient states that she subsequently decided that we must of pack something  in there and started pulling the string out and as it kept coming and coming she realized this was likely her tendon. With that being said she continued to remove as much of this as she could. She then I subsequently proceeded to using tubes of antibiotic ointment which she will stick down into the wound and then scored as much as she can until she sees it coming out of the other wound opening. She states that in doing this she is actually made things better and there is less redness and irritation. With regard to her foot wound she does have some necrotic tendon and tissue noted in one small corner but again the actual wound itself seems to be doing better with good granulation in general compared to my last evaluation. 12/7; continued improvement in the wound on the substantial part of the right medial foot. Still a necrotic area inferiorly that required debridement but the rest of this looks very healthy and is contracting. She has 2 wounds on the right lateral leg which were her original drainage sites from her abscess but all of this looks a lot better as well. She has been using silver alginate after putting antibiotic biotic ointment in one wound and watching it come out the other. I have talked to her in some detail today. I had given her names of orthopedic surgeons at [facility] for second opinion on what to do about the right leg. I do not think the patient never called them. She has not been able to get a hold of the orthopedic surgeon in [place] that she had put a lot of faith in as being somebody would give her an opinion that she would trust. I talked to her today and said even if I could get her in to another orthopedic surgeon about the leg which she accept an amputation and she said she would not therefore I am not going to press this issue for the moment 12/14; continued improvement in his substantial wound on the right medial foot. There is still a necrotic area inferiorly with  tightly adherent necrotic debris which I have been working on debriding each time she is here. She does not have an orthopedic appointment. Since last time she was here I looked over her cultures which were essentially MRSA on the foot wound and gram-negative rods in the abscess on the anterior leg. 12/21; continued improvement in the area on the right medial foot. She is not up on this much and that is probably a good thing since I do not know it could support continuous ambulation. She has a small area on the right lateral leg which were remanence of the IandD's I did because of the abscess. I think she should probably have prophylactic antibiotics I am going to have to look this over to see if we can make an intelligent decision here. In the meantime her major wound is come down nicely. Necrotic area inferiorly is still there but looks a lot better 04/06/2019; she has had some improvement in the overall surface area on the right medial foot somewhat narrowedr both but somewhat longer. The areas on the right lateral leg which were initial IandD sites are superficial. Nothing is present on the right heel. We are using silver alginate to the wound areas  1/18; right medial foot somewhat smaller. Still a deep probing area in the most distal recess of the wound. She has nothing open on the right leg. She has a new wound on the plantar aspect of her left fourth toe which may have come from just pulling skin. The patient using Medihoney on the wound on her foot under silver alginate. I cannot discourage her from this 2/1; 2-week follow-up using silver alginate on the right foot and her left fourth toe. The area on the right dorsal foot is contracted although there is still the deep area in the most distal part of the wound but still has some probing depth. No overt infection 2/15; 2-week follow-up. She continues to have improvement in the surface area on the dorsal right foot. Even the tunneling area from  last time is almost closed. The area that was on the plantar part of her left fourth toe over the PIP is indeed closed 3/1; 2-week follow-up. Continued improvement in surface area. The original divot that we have been debriding inferiorly I think has full epithelialization although the epithelialization is gone down into the wound with probably 4 mm of depth. Even under intense illumination I am unable to see anything open here. The remanence of the wound in this area actually look quite healthy. We have been using silver alginate 3/15; 2-week follow-up. Unfortunately not as good today. She has a comma shaped wound on the dorsal foot however the upper part of this is larger. Under illumination debris on the surface She also tells [place] that she was on her right leg 2 times in the last couple of weeks mostly to reach up for things above her head etc. She felt a sharp pain in the right leg which she thinks is somewhere from the ankle to the knee. The patient has neuropathy and is really uncertain. She cannot feel her foot so she does not think it was coming from there 3/29; 2-week follow-up. Her wound measures smaller. Surface of the wound appears reasonable. She is using silver alginate with underlying Medihoney. She has home health. X-rays I did of her tib-fib last time were negative although it did show arterial calcification 4/12; 2-week follow-up. Her wound measures smaller in length. Using manuka honey with silver alginate on top. She has home health. 4/26; 2-week follow-up. Her wound is smaller but still very adherent debris under illumination requiring debridement she has been using manuka honey with silver alginate. 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 Been using Hydrofera Blue. She is on suppressive Bactrim but apparently she has been taking it twice daily 6/7; I have not seen this  wound and about 6 weeks. Since then she was up in [place]. By her own admission she was walking on the foot because she did not have a wheelchair. The wound is not nearly as healthy looking as it was the last time I saw this. We ordered different things for her but she only uses Medihoney and silver alginate. As far as I know she is on suppressive trimethoprim sulfamethoxazole. She does not admit to any fever or chills. Her CBGs apparently are at baseline however she is saying that she feels some discomfort on the lateral part of her ankle I looked over her last inflammatory markers from the summer 2020 at which time she had a deeply necrotic infected wound in this area.  On 11/10/2018 her sedimentation rate was 56 and C-reactive protein 9.9. This was 107 and 29 on 07/29/2018. 6/17; the patient had a necrotic wound the last time she was here on the right dorsal foot. After debridement I did a culture. This showed a very resistant ESBL Klebsiella as well as Enterococcus. Her x-ray of the foot which was done because of warmth and some discomfort showed bone destruction within the carpal bones involving the navicular acute cuboid lateral middle cuneiforms but essentially unchanged from her prior study which was done on 10/29/2018. The findings were felt to represent chronic osteomyelitis. We did inflammatory markers on her. Her white count was 5.25 sedimentation rate 16 and C-reactive protein at 11.1. Notable for the fact that in August 2020 her CRP was 9.9 and sedimentation rate 56. I have looked at her x-rays. It is true that the bone destruction is very impressive however the patient came into this clinic for the wound on her right foot with pieces of bone literally falling out anteriorly with purulent material. I am not exactly sure I could have expected anything different. She has not been systemically unwell no fever chills or blood sugars have been reasonable. 6/28; she arrives with a right heel  closed. The substantial area on the right anterior foot looks healthy. Much better looking surface. I think we can change to [facility] seems to help this previously. She is getting her antibiotics at dialysis she should be just about finished 7/9; changed to [facility] last week. Surface wound looks satisfactory not much change in surface area however. She is going to [place] state next week this is usually a difficult thing for this patient follow-up will be for 2 weeks. 7/23; using Hydrofera Blue. She returns from her trip and the wound looks surprisingly good. Usually when this patient goes on trips she comes back with a lot of problems with the wound. She is saying that she sometimes feels an episodic "crunching" feeling on the lateral part of the foot. She is neuropathic and not feeling pain but wonders whether this could be a neuropathic dysesthesia. 11/13/19-Patient returns after 3 weeks, the wound itself is stable and patient states that there is nothing new going on she is on some extra anxiety medications and is resisting the temptation to pick at the dry skin around the wound. 9/20; patient has not been here in over a month and I have not seen her in 2 months. The wound in terms of size I think is about the same. There is no exposed bone. She has a nonviable surface on this. She is supposed to be using [facility] however she is also been using some form of honey preparation as well as a silver-based dressings. I do not think she has any pattern to this. 10/4; 2-week follow-up. Patient has been using some form of spray which she says has honey and silver to purchase this online she has been covering it with gauze. In spite of this the wound actually looks quite good. The deeper divot distally appears to be close down. There is a rim of epithelialization. 10/18; 2-week follow-up. Patient has been using her Hydrofera Blue covered with her silver honey spray that she got  online. 11/1; 2-week follow-up. She is using Hydrofera Blue with a silver honey spray. Wound bed is measuring smaller. She has noticed that her foot is warmer on the right. She is concerned about infection. For a long period of time I had her on prophylactic  trimethoprim sulfamethoxazole DS 1 tablet daily. She is asking for this to be restarted. The patient is walking on this foot because of repairs that are being done in a home her but her room is on the second floor she has to go up and down stairs. I have cautioned against this however as usual she will do exactly what she wants to do Objective Constitutional Patient is hypertensive.. Pulse regular and within target range for patient.Marland Kitchen Respirations regular, non-labored and within target range.. Temperature is normal and within the target range for the patient.Marland Kitchen Appears in no distress. Vitals Time Taken: 1:04 PM, Height: 67 in, Source: Stated, Weight: 125 lbs, Source: Stated, BMI: 19.6, Temperature: 98.5 F, Pulse: 86 bpm, Respiratory Rate: 18 breaths/min, Blood Pressure: 210/93 mmHg, Capillary Blood Glucose: 384 mg/dl. General Notes: glucose per pt's insulin pump Cardiovascular Pedal pulses palpable on the right. General Notes: Wound exam; the wound is contracting nicely. She still has an inferior divot that is probably still open a bit with slight amount of drainage she experiences every now and then. Her foot is warmer than the left. Integumentary (Hair, Skin) Wound #43 status is Open. Original cause of wound was Gradually Appeared. The wound is located on the Right,Medial Foot. The wound measures 2.8cm length x 3.3cm width x 0.1cm depth; 7.257cm^2 area and 0.726cm^3 volume. There is Fat Layer (Subcutaneous Tissue) exposed. There is no tunneling or undermining noted. There is a medium amount of serosanguineous drainage noted. The wound margin is flat and intact. There is medium (34-66%) red, pink granulation within the wound bed. There is  a medium (34-66%) amount of necrotic tissue within the wound bed including Adherent Slough. 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 Dressing Change Frequency: Wound #43 Right,Medial Foot: Change Dressing every other day. Wound Cleansing: Wound #43 Right,Medial Foot: Clean wound with Wound Cleanser - or normal saline Primary Wound Dressing: Wound #43 Right,Medial Foot: Hydrofera Blue Secondary Dressing: Wound #43 Right,Medial Foot: Kerlix/Rolled Gauze Dry Gauze ABD pad Other: - ACE wrap Edema Control: Elevate legs to the level of the heart or above for 30 minutes daily and/or when sitting, a frequency of: - throughout the day Home Health: [facility] for wound care. - Interim The following medication(s) was prescribed: sulfamethoxazole-trimethoprim oral 400 mg-80 mg tablet 1 tablet oral q24h, give after dialysis on dialysis days for wouind infection starting 02/01/2020 1. This patient undoubtedly has chronic osteomyelitis in the remanent of the right foot I have asked her not to walk on this excessively 2. I am quite certain she has underlying chronic osteomyelitis in this foot. I have represcribed her trimethoprim sulfamethoxazole regular strength 1 tablet every 24 after dialysis on dialysis days 3. In spite of 1 and 2 above the wound on the right medial foot is actually smaller and looks healthier we are continuing with Hydrofera Blue in this area. 4 she still has PAD although I think I feel her pulses in the dorsalis pedis and popliteal. I do not think there is an issue here Electronic Signature(s) Signed: 02/01/2020 5:37:06 PM By: Linton Ham  MD Entered By: Linton Ham on 02/01/2020 13:39:02 -------------------------------------------------------------------------------- SuperBill Details Patient Name: Date of Service: [place] LLO [place] Y, JEA Nancy L. 02/01/2020  Medical Record Number: [id number] Patient Account Number: [id number] Date of Birth/Sex: Treating RN: 08-20-1970 (49 y.o. [name] Primary Care Provider: Sanjuana [name], [name] Other Clinician: Referring Provider: Treating Provider/Extender: [name], [name] Weeks in Treatment: 73 Diagnosis Coding ICD-10 Codes Code Description [phone] Other chronic osteomyelitis, right ankle and foot L97.514 Non-pressure chronic ulcer of other part of right foot with necrosis of bone L97.411 Non-pressure chronic ulcer of right heel and midfoot limited to breakdown of skin E10.621 Type 1 diabetes mellitus with foot ulcer Facility Procedures CPT4 Code: [id number] Description: 99213 - WOUND CARE VISIT-LEV 3 EST PT Modifier: Quantity: 1 Physician Procedures : CPT4 Code Description Modifier [id number] - WC PHYS LEVEL 4 - EST PT ICD-10 Diagnosis Description M86.671 Other chronic osteomyelitis, right ankle and foot L97.514 Non-pressure chronic ulcer of other part of right foot with necrosis of bone L97.411  Non-pressure chronic ulcer of right heel and midfoot limited to breakdown of skin E10.621 Type 1 diabetes mellitus with foot ulcer Quantity: 1 Electronic Signature(s) Signed: 02/01/2020 5:37:06 PM By: Linton Ham MD Entered By: Linton Ham on 02/01/2020 13:39:23

## 2020-02-15 ENCOUNTER — Encounter (HOSPITAL_BASED_OUTPATIENT_CLINIC_OR_DEPARTMENT_OTHER): Payer: Medicare HMO | Admitting: Internal Medicine

## 2020-02-15 ENCOUNTER — Other Ambulatory Visit: Payer: Self-pay

## 2020-02-15 DIAGNOSIS — E1069 Type 1 diabetes mellitus with other specified complication: Secondary | ICD-10-CM | POA: Diagnosis not present

## 2020-02-16 NOTE — Progress Notes (Signed)
[name], [name] ([id number]) Visit Report for 02/15/2020 Arrival Information Details Patient Name: Date of Service: [name] 02/15/2020 12:30 PM Medical Record Number: [id number] Patient Account Number: [id number] Date of Birth/Sex: Treating RN: December 24, 1970 (49 y.o. [name], [name] Primary Care [name]: [name], [name] Other Clinician: Referring [name]: Treating [name]: [name], [name] [name] in Treatment: 68 Visit Information History Since Last Visit Added or deleted any medications: No Patient Arrived: [name] Any new allergies or adverse reactions: No Arrival Time: 12:59 Had a fall or experienced change in No Accompanied By: self activities of daily living that may affect Transfer Assistance: None risk of falls: Patient Identification Verified: Yes Signs or symptoms of abuse/neglect since last visito No Secondary Verification Process Completed: Yes Hospitalized since last visit: No Patient Requires Transmission-Based Precautions: No Implantable device outside of the clinic excluding No Patient Has Alerts: No cellular tissue based products placed in the center since last visit: Has Dressing in Place as Prescribed: Yes Pain Present Now: No Electronic Signature(s) Signed: 02/16/2020 5:21:16 PM By: Baruch Gouty RN, BSN Entered By: Baruch Gouty on 02/15/2020 13:01:12 -------------------------------------------------------------------------------- Clinic Level of Care Assessment Details Patient Name: Date of Service: [name] 02/15/2020 12:30 PM Medical Record Number: [id number] Patient Account Number: [id number] Date of Birth/Sex: Treating RN: [birth year] (49 y.o. [name] Primary Care [name]: [name], [name] Other Clinician: Referring [name]: Treating [name]/Extender: [name], [name] [name] in Treatment: 59 Clinic Level of Care Assessment Items TOOL 4 Quantity  Score X- 1 0 Use when only an EandM is performed on FOLLOW-UP visit ASSESSMENTS - Nursing Assessment / Reassessment X- 1 10 Reassessment of Co-morbidities (includes updates in patient status) X- 1 5 Reassessment of Adherence to Treatment Plan ASSESSMENTS - Wound and Skin A ssessment / Reassessment X - Simple Wound Assessment / Reassessment - one wound 1 5 []  - 0 Complex Wound Assessment / Reassessment - multiple wounds []  - 0 Dermatologic / Skin Assessment (not related to wound area) ASSESSMENTS - Focused Assessment []  - 0 Circumferential Edema Measurements - multi extremities []  - 0 Nutritional Assessment / Counseling / Intervention X- 1 5 Lower Extremity Assessment (monofilament, tuning fork, pulses) []  - 0 Peripheral Arterial Disease Assessment (using hand held doppler) ASSESSMENTS - Ostomy and/or Continence Assessment and Care []  - 0 Incontinence Assessment and Management []  - 0 Ostomy Care Assessment and Management (repouching, etc.) PROCESS - Coordination of Care X - Simple Patient / Family Education for ongoing care 1 15 []  - 0 Complex (extensive) Patient / Family Education for ongoing care X- 1 10 Staff obtains Programmer, systems, Records, T Results / Process Orders est X- 1 10 Staff telephones HHA, Nursing Homes / Clarify orders / etc []  - 0 Routine Transfer to another Facility (non-emergent condition) []  - 0 Routine Hospital Admission (non-emergent condition) []  - 0 New Admissions / Biomedical engineer / Ordering NPWT Apligraf, etc. , []  - 0 Emergency Hospital Admission (emergent condition) X- 1 10 Simple Discharge Coordination []  - 0 Complex (extensive) Discharge Coordination PROCESS - Special Needs []  - 0 Pediatric / Minor Patient Management []  - 0 Isolation Patient Management []  - 0 Hearing / Language / Visual special needs []  - 0 Assessment of Community assistance (transportation, D/C planning, etc.) []  - 0 Additional assistance / Altered  mentation []  - 0 Support Surface(s) Assessment (bed, cushion, seat, etc.) INTERVENTIONS - Wound Cleansing / Measurement X - Simple Wound Cleansing -  one wound 1 5 []  - 0 Complex Wound Cleansing - multiple wounds X- 1 5 Wound Imaging (photographs - any number of wounds) []  - 0 Wound Tracing (instead of photographs) X- 1 5 Simple Wound Measurement - one wound []  - 0 Complex Wound Measurement - multiple wounds INTERVENTIONS - Wound Dressings []  - 0 Small Wound Dressing one or multiple wounds X- 1 15 Medium Wound Dressing one or multiple wounds []  - 0 Large Wound Dressing one or multiple wounds X- 1 5 Application of Medications - topical []  - 0 Application of Medications - injection INTERVENTIONS - Miscellaneous []  - 0 External ear exam []  - 0 Specimen Collection (cultures, biopsies, blood, body fluids, etc.) []  - 0 Specimen(s) / Culture(s) sent or taken to Lab for analysis []  - 0 Patient Transfer (multiple staff / Civil Service fast streamer / Similar devices) []  - 0 Simple Staple / Suture removal (25 or less) []  - 0 Complex Staple / Suture removal (26 or more) []  - 0 Hypo / Hyperglycemic Management (close monitor of Blood Glucose) []  - 0 Ankle / Brachial Index (ABI) - do not check if billed separately X- 1 5 Vital Signs Has the patient been seen at the hospital within the last three years: Yes Total Score: 110 Level Of Care: New/Established - Level 3 Electronic Signature(s) Signed: 02/15/2020 5:51:53 PM By: Nancy Hurst RN, BSN Entered By: [name] on 02/15/2020 13:31:53 -------------------------------------------------------------------------------- Lower Extremity Assessment Details Patient Name: Date of Service: [name], [name] 02/15/2020 12:30 PM Medical Record Number: [id number] Patient Account Number: [id number] Date of Birth/Sex: Treating RN: 02/22/1971 (49 y.o. [name] Primary Care Riot Barrick: [name], [name] Other Clinician: Referring  Ardelia Wrede: Treating Keegan Ducey/Extender: [name], [name] [name] in Treatment: 75 Edema Assessment Assessed: [Left: No] [Right: No] Edema: [Left: N] [Right: o] Calf Left: Right: Point of Measurement: From Medial Instep 26.5 cm Ankle Left: Right: Point of Measurement: From Medial Instep 18 cm Vascular Assessment Pulses: Dorsalis Pedis Palpable: [Right:Yes] Electronic Signature(s) Signed: 02/16/2020 5:21:16 PM By: Baruch Gouty RN, BSN Entered By: Baruch Gouty on 02/15/2020 13:06:16 -------------------------------------------------------------------------------- Multi Wound Chart Details Patient Name: Date of Service: [place], [name] 02/15/2020 12:30 PM Medical Record Number: [id number] Patient Account Number: [id number] Date of Birth/Sex: Treating RN: 27-Dec-1970 (49 y.o. [name] Primary Care Junelle Hashemi: [name], [name] Other Clinician: Referring Brentton Wardlow: Treating Marion Seese/Extender: [name], [name] [name] in Treatment: 26 Vital Signs Height(in): 28 Pulse(bpm): 75 Weight(lbs): 125 Blood Pressure(mmHg): 180/93 Body Mass Index(BMI): 20 Temperature(F): 99 Respiratory Rate(breaths/min): 18 Photos: [43:No Photos Right, Medial Foot] [N/A:N/A N/A] Wound Location: [43:Gradually Appeared] [N/A:N/A] Wounding Event: [43:Diabetic Wound/Ulcer of the Lower] [N/A:N/A] Primary Etiology: [43:Extremity Cataracts, Chronic sinus] [N/A:N/A] Comorbid History: [43:problems/congestion, Anemia, Sleep Apnea, Deep Vein Thrombosis, Hypertension, Peripheral Arterial Disease, Type I Diabetes, Osteoarthritis, Osteomyelitis, Neuropathy, Seizure Disorder 08/04/2018] [N/A:N/A] Date Acquired: [43:75] [N/A:N/A] [name] of Treatment: [43:Open] [N/A:N/A] Wound Status: [43:Yes] [N/A:N/A] Clustered Wound: [43:3x3.8x0.1] [N/A:N/A] Measurements L x W x D (cm) [43:8.954] [N/A:N/A] A (cm) : rea [43:0.895] [N/A:N/A] Volume (cm) : [43:87.70%] [N/A:N/A] %  Reduction in A rea: [43:95.90%] [N/A:N/A] % Reduction in Volume: [43:Grade 4] [N/A:N/A] Classification: [43:Medium] [N/A:N/A] Exudate A mount: [43:Serosanguineous] [N/A:N/A] Exudate Type: [43:red, brown] [N/A:N/A] Exudate Color: [43:Flat and Intact] [N/A:N/A] Wound Margin: [43:Medium (34-66%)] [N/A:N/A] Granulation A mount: [43:Pink] [N/A:N/A] Granulation Quality: [43:Medium (34-66%)] [N/A:N/A] Necrotic A mount: [43:Fat Layer (Subcutaneous Tissue): Yes N/A] Exposed Structures: [43:Fascia: No Tendon: No Muscle: No  Joint: No Bone: No Small (1-33%)] [N/A:N/A] Treatment Notes Wound #43 (Right, Medial Foot) 1. Cleanse With Wound Cleanser 3. Primary Dressing Applied Hydrofera Blue 4. Secondary Dressing Dry Gauze 6. Support Layer Psychologist, forensic Notes ace wrap as secondary. Electronic Signature(s) Signed: 02/15/2020 5:39:18 PM By: Linton Ham MD Signed: 02/15/2020 5:51:53 PM By: Nancy Hurst RN, BSN Entered By: Linton Ham on 02/15/2020 13:54:12 -------------------------------------------------------------------------------- Multi-Disciplinary Care Plan Details Patient Name: Date of Service: [id number] [name] 02/15/2020 12:30 PM Medical Record Number: [id number] Patient Account Number: [id number] Date of Birth/Sex: Treating RN: 1970/11/17 (49 y.o. [name] Primary Care Neysha Criado: [name], [name] Other Clinician: Referring Renda Pohlman: Treating Job Holtsclaw/Extender: [name], [name] [name] in Treatment: 78 Active Inactive Wound/Skin Impairment Nursing Diagnoses: Impaired tissue integrity Knowledge deficit related to ulceration/compromised skin integrity Goals: Patient/caregiver will verbalize understanding of skin care regimen Date Initiated: 09/03/2018 Target Resolution Date: 03/18/2020 Goal Status: Active Ulcer/skin breakdown will have a volume reduction of 30% by week 4 Date Initiated: 09/03/2018 Date Inactivated: 10/07/2018 Target  Resolution Date: 10/01/2018 Goal Status: Unmet Unmet Reason: COMORBITIES Ulcer/skin breakdown will have a volume reduction of 50% by week 8 Date Initiated: 10/07/2018 Date Inactivated: 11/03/2018 Target Resolution Date: 10/31/2018 Goal Status: Unmet Unmet Reason: Osteomyelitis Interventions: Assess patient/caregiver ability to obtain necessary supplies Assess patient/caregiver ability to perform ulcer/skin care regimen upon admission and as needed Assess ulceration(s) every visit Provide education on ulcer and skin care Treatment Activities: Skin care regimen initiated : 09/03/2018 Topical wound management initiated : 09/03/2018 Notes: Electronic Signature(s) Signed: 02/15/2020 5:51:53 PM By: Nancy Hurst RN, BSN Entered By: [name] on 02/15/2020 13:17:59 -------------------------------------------------------------------------------- Pain Assessment Details Patient Name: Date of Service: [name] 02/15/2020 12:30 PM Medical Record Number: [id number] Patient Account Number: [id number] Date of Birth/Sex: Treating RN: 03-30-1971 (49 y.o. [name] Primary Care Elisse Pennick: [name], [name] Other Clinician: Referring Brigette Hopfer: Treating Harwood Nall/Extender: [name], [name] [name] in Treatment: 72 Active Problems Location of Pain Severity and Description of Pain Patient Has Paino No Site Locations Rate the pain. Rate the pain. Current Pain Level: 0 Pain Management and Medication Current Pain Management: Electronic Signature(s) Signed: 02/16/2020 5:21:16 PM By: Baruch Gouty RN, BSN Entered By: Baruch Gouty on 02/15/2020 13:05:52 -------------------------------------------------------------------------------- Patient/Caregiver Education Details Patient Name: Date of Service: [name] 11/15/2021andnbsp12:30 PM Medical Record Number: [id number] Patient Account Number: [id number] Date of Birth/Gender: Treating  RN: [birth year] (49 y.o. [name] Primary Care Physician: [name], [name] Other Clinician: Referring Physician: Treating Physician/Extender: [name], [name] [name] in Treatment: 49 Education Assessment Education Provided To: Patient Education Topics Provided Wound/Skin Impairment: Methods: Explain/Verbal Responses: State content correctly Electronic Signature(s) Signed: 02/15/2020 5:51:53 PM By: Nancy Hurst RN, BSN Entered By: [name] on 02/15/2020 13:18:32 -------------------------------------------------------------------------------- Wound Assessment Details Patient Name: Date of Service: [name] 02/15/2020 12:30 PM Medical Record Number: [id number] Patient Account Number: [id number] Date of Birth/Sex: Treating RN: [birth year] (50 y.o. [name] Primary Care Trevar Boehringer: [name], [name] Other Clinician: Referring Gaylyn Berish: Treating Asiah Browder/Extender: [name], [name] [name] in Treatment: 75 Wound Status Wound Number: 43 Primary Diabetic Wound/Ulcer of the Lower Extremity Etiology: Wound Location: Right, Medial Foot Wound Open Wounding Event: Gradually Appeared Status: Date Acquired: 08/04/2018 Comorbid Cataracts, Chronic sinus problems/congestion, Anemia, Sleep [name] Of Treatment: 75 History: Apnea, Deep Vein Thrombosis, Hypertension,  Peripheral Arterial Clustered Wound: Yes Disease, Type I Diabetes, Osteoarthritis, Osteomyelitis, Neuropathy, Seizure Disorder Wound Measurements Length: (cm) 3 Width: (cm) 3.8 Depth: (cm) 0.1 Area: (cm) 8.954 Volume: (cm) 0.895 % Reduction in Area: 87.7% % Reduction in Volume: 95.9% Epithelialization: Small (1-33%) Tunneling: No Undermining: No Wound Description Classification: Grade 4 Wound Margin: Flat and Intact Exudate Amount: Medium Exudate Type: Serosanguineous Exudate Color: red, brown Foul Odor After Cleansing: No Slough/Fibrino Yes Wound  Bed Granulation Amount: Medium (34-66%) Exposed Structure Granulation Quality: Pink Fascia Exposed: No Necrotic Amount: Medium (34-66%) Fat Layer (Subcutaneous Tissue) Exposed: Yes Necrotic Quality: Adherent Slough Tendon Exposed: No Muscle Exposed: No Joint Exposed: No Bone Exposed: No Treatment Notes Wound #43 (Right, Medial Foot) 1. Cleanse With Wound Cleanser 3. Primary Dressing Applied Hydrofera Blue 4. Secondary Dressing Dry Gauze 6. Support Layer Psychologist, forensic Notes ace wrap as secondary. Electronic Signature(s) Signed: 02/16/2020 5:21:16 PM By: Baruch Gouty RN, BSN Entered By: Baruch Gouty on 02/15/2020 13:07:44 -------------------------------------------------------------------------------- Vitals Details Patient Name: Date of Service: [name] 02/15/2020 12:30 PM Medical Record Number: [id number] Patient Account Number: [id number] Date of Birth/Sex: Treating RN: 01/23/1971 (49 y.o. [name] Primary Care Kayah Hecker: [name], [name] Other Clinician: Referring Averyana Pillars: Treating Lelia Jons/Extender: [name], [name] [name] in Treatment: 42 Vital Signs Time Taken: 13:01 Temperature (F): 99 Height (in): 67 Pulse (bpm): 85 Source: Stated Respiratory Rate (breaths/min): 18 Weight (lbs): 125 Blood Pressure (mmHg): 180/93 Source: Stated Reference Range: 80 - 120 mg / dl Body Mass Index (BMI): 19.6 Notes glucose >400 at present per pt's meter Electronic Signature(s) Signed: 02/16/2020 5:21:16 PM By: Baruch Gouty RN, BSN Entered By: Baruch Gouty on 02/15/2020 13:05:42

## 2020-02-16 NOTE — Progress Notes (Signed)
Nancy, [name] ([id number]) Visit Report for 02/15/2020 HPI Details Patient Name: Date of Service: [name] 02/15/2020 12:30 PM Medical Record Number: [id number] Patient Account Number: [id number] Date of Birth/Sex: Treating RN: 03/01/1971 (49 y.o. [name] Primary Care Provider: [name], [name] Other Clinician: Referring Provider: Treating Provider/Extender: [name], [name] Weeks in Treatment: 71 History of Present Illness HPI Description: 49 year old diabetic who is known to have type 1 diabetes which is poorly controlled last hemoglobin A1c was 11%. She comes in with a ulcerated area on the left lateral foot which has been there for over 6 months. Was recently she has been treated by [name] of podiatry who saw her last on 05/28/2016. Review of his notes revealed that the patient had incision and drainage with placement of antibiotic beads to the left foot on 04/11/2016 for possible osteomyelitis of the cuboid bone. Over the last year she's had a history of amputation of the left fifth toe and a femoropopliteal popliteal bypass graft somewhere in April 2017. 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 His note [name] mentions that the patient has been referred to me for further wound care and possibly great candidate for hyperbaric oxygen therapy due to recurrent osteomyelitis. However we do not have any x-rays of biopsy reports confirming this. He has been on several antibiotics including Bactrim and most recently is on doxycycline for an MRSA. I understand, the patient was not a candidate for IV antibiotics as she has had previous PICC lines which resulted in blood clots in both arms. There was a x-ray report dated 04/04/2016 on [name] notes which showed evidence of fifth ray resection left foot with osteolytic changes noted to the fourth metatarsal and cuboid bone on the left. 06/13/2016 -- had a left foot x-ray which  showed no acute fracture or dislocation and no definite radiographic evidence of osteomyelitis. Advanced osteopenia was seen. 06/20/2016 -- she has noticed a new wound on the right plantar foot in the region where she had a callus before. 06/27/16- the patient did have her x-ray of the right foot which showed no findings to suggest osteomyelitis. She saw her endocrinologist, [name], yesterday. Her A1c in January was 11. He also indicates mismanagement and noncompliance regarding her diabetes. She is currently on Bactrim for a lip infection. She is complaining of nausea, vomiting and diarrhea. She is unable to articulate the exact orders or dosing of the Bactrim; it is unclear when she will complete this. 07/04/2016 -- results from [facility] of ABIs with ankle waveforms were noted from 02/14/2016. The examination done on 06/27/2015 showed noncompressible ABIs with the right being 1.45 and the left being 1.33. The present examination showed a right ABI of 1.19 on the left of 1.33. The conclusion was that right normal ABI in the lower extremity at rest however compared to previous study which was noncompressible ABI may be falsely elevated side suggesting medial calcification. The left ABI suggested medial calcification. 08/01/2016 -- the patient had more redness and pain on her right foot and did not get to come to see as noted she see her PCP or go to the ER and decided to take some leftover metronidazole which she had at home. As usual, the patient does report she feels and is rather noncompliant. 08/08/2016 -- -- x-ray of the right foot -- FINDINGS:Transmetatarsal amputation is noted. No bony destruction is noted to suggest osteomyelitis. IMPRESSION: No evidence of  osteomyelitis. Postsurgical changes are seen. MRI would be more sensitive for possible bony changes. Culture has grown Serratia Marcescens -- sensitive to Bactrim, ciprofloxacin, ceftazidime she was seen by [name] on  08/06/2016. He did not find any exposed bone, muscle, tendon, ligament or joint. There was no malodor and he did a excisional debridement in the office. ============ Old notes: 49 year old patient who is known to the wound clinic for a while had been away from the wound clinic since 09/01/2014. Over the last several months she has been admitted to various hospitals including [facility] [place]. She was treated for a right metatarsal osteomyelitis with a transmetatarsal amputation and this was done about 2 months ago. He has a small ulcerated area on the right heel and she continues to have an ulcerated area on the left plantar aspect of the foot. The patient was recently admitted to the [facility] between 7/12 and 10/18/2014. she was given 3 weeks of IV vancomycin and was to follow-up with her surgeons at [facility] and also took oral vancomycin for C. difficile colitis. 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 MRI of the left foot done on 10/14/2014 showed no abscess or osteomyelitis. 04/27/15; this is a patient we know from previous stays in the wound care center. She is a type I diabetic I am not sure of her control currently. Since the last time I saw her she is had a right transmetatarsal amputation and has no wounds on her right foot and has no open wounds. She is been followed at the wound care center at [facility] in [place]. She comes today with the desire to undergo hyperbaric treatment locally. Apparently one of her wound care providers in [place] has suggested hyperbarics. This is in response to an MRI from 04/18/15 that showed increased marrow signal and loss of the proximal  fifth metatarsal cortex evidence of osteomyelitis with likely early osteomyelitis in the cuboid bone as well. She has a large wound over the base of the fifth metatarsal. She also has a eschar over her the tips of her toes on 1,3 and 5. She does not have peripheral pulses and apparently is going for an angiogram tomorrow which seems reasonable. After this she is going to infectious disease at [facility]. They have been using Medihoney to the large wound on the lateral aspect of the left foot to. The patient has known Charcot deformity from diabetic neuropathy. She also has known diabetic PAD. Surprisingly I can't see that she has had any recent antibiotics, the patient states the last antibiotic she had was at the end of November for 10 days. I think this was in response to culture that showed group G strep although I'm not exactly sure where the culture was from. She is also had arterial studies on 03/29/15. This showed a right ABI of 1.4 that was noncompressible. Her left ABI was 0.73. 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 It was not felt that arterial inflow was adequate for healing of a foot ulcer. Her Doppler waveforms looked monophasic ===== READMISSION 02/28/17; this is in an now 49 year old woman we've had at several different occasions in this clinic. 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 She has  a remote ex-smoker. She was last seen in this clinic by Dr. Con Memos I think in May. More recently she is been followed by her podiatrist [name] an infectious disease Dr. Megan Salon. She has 2 open wounds the major one is over the right first metatarsal head she also has a wound on the left plantar foot. an MRI of the right foot on 01/01/17 showed a soft tissue ulcer along the plantar aspect of the first metatarsal base consistent with osteomyelitis of the first metatarsal stump. Dr. 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 According to the patient this is been open for slightly over a month. She has been on a combination of Cipro 500 twice a day, Zyvox 600 twice a day and Flagyl 500 3 times a day for over a month now as directed by Dr. Megan Salon. cultures of the right foot earlier this year showed MRSA in January and Serratia in May. January also had a few viridans strep. Recent x-rays of both feet were done and [name] office and I don't have these reports. The patient has known PAD and has a history of aleft femoropopliteal bypass in April 2017. 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 Dwyane Dee. In spite of this the last hemoglobin A1c I can see is 10.1 on 01/01/2017. She is being referred by 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 She is been using Medihoney to this area and also an area on the left midfoot. She is using healing sandals bilaterally. ABIs in this clinic at the left posterior tibial was 1.1 noncompressible on the right READMISSION Non invasive vascular NOVANT 5/18 Aftercare following surgery of the circulatory system Procedure Note - Interface, External Ris In - 08/13/2016 11:05 AM EDT Procedure: Examination consists of physiologic resting arterial pressures of the brachial and ankle arteries bilaterally with continuous wave Doppler waveform analysis. Previous: Previous exam performed on 02/14/16 demonstrated ABIs of Rt = 1.19 and Lt = 1.33. Right: ABI = non-compressible PT 1.47 DP. S/P transmet amputation. , Left: ABI = 1.52, 2nd digit pressure = 87 mmHg Conclusions: Right: ABI (>1.3) may be falsely elevated, suggesting medial calcification. Left: ABI (>1.3) may be falsely  elevated, suggesting medial calcification The patient is a now 49 year old type I diabetic is had multiple issues her graded to chronic diabetic foot ulcers. 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 We had her in the clinic lastin November. At that point she had wounds on her bilateral feet.she had wanted to try hyperbarics however the healogics review process denied her because she hadn't followed up with her vascular surgeon for her left femoropopliteal bypass. The bypass was done by Dr. Raul Del at [facility]. We made her a follow-up with Dr. Raul Del however she did not keep the appointment and therefore she was not approved The patient shows me a small wound on her left fourth metatarsal head on her phone. She developed rapid discoloration in the plantar aspect of the left foot and she was admitted to hospital from 2/2 through 05/10/17 with wet gangrene of the left foot osteomyelitis of the fourth metatarsal heads. She was admitted acutely ill with a temperature of 103.  She was started on broad-spectrum vancomycin and cefepime. On 05/06/17 she was taken to the OR by [name] her podiatric surgeon for an incision and drainage irrigation of the left foot wound. Cultures from this surgery revealed group be strep and anaerobes. she was seen by Dr.Xu of orthopedic surgery and scheduled for a below-knee amputation which she u refused. Ultimately she was discharged on Levaquin and Flagyl for one month. MRI 05/05/17 done while she was in the hospital showed abscess adjacent to the fourth metatarsal head and neck small abscess around the fourth flexor tendon. Inflammatory phlegmon and gas in the soft tissues along the lateral aspect of the fourth phalanx. Findings worrisome for osteomyelitis involving the fourth proximal and middle phalanx and also the third and fourth metatarsals. Finally the patient had actually shortly  before this followed up with Dr. Raul Del at no time on 04/29/17. He felt that her left femoropopliteal bypass was patent he felt that her left-sided toe pressures more than adequate for healing a wound on the left foot. This was before her acute presentation. 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 05/28/17; she is started hyperbarics. The patient tells me that for some reason she was not actually on Levaquin but I think on ciprofloxacin. She was on Flagyl. She only started her Levaquin yesterday due to some difficulty with the pharmacy and perhaps her sister picking it up. She has an appointment with [name] tomorrow and with infectious disease early next week. She has no new complaints 06/06/17; the patient continues in hyperbarics. She saw [name] on 05/29/17 who is her podiatric surgeon. He is elected for a transmetatarsal amputation on 06/27/17. I'm not sure at what level he plans to do this amputation. The patient is unaware She also saw Dr. Megan Salon of infectious disease who elected to continue her on current antibiotics I think this is ciprofloxacin and Flagyl. I'll need to clarify with her tomorrow if she actually has this. We're using silver alginate to the actual wound. Necrotic surface today with material under the flap of her foot. Original MRI showed abscesses as well as osteomyelitis of the proximal and middle fourth phalanx and the third and fourth metatarsal heads 06/11/17; patient continues in hyperbarics and continues on oral antibiotics. She is doing well. The wound looks better. The necrotic part of this under the flap in her superior foot also looks better. she is been to see [name]. I haven't had a chance to look at his note. Apparently he has put the transmetatarsal amputation on hold her request it is still planning to take her to the OR for debridement and product application ACEL. I'll see if I can find his note. I'll therefore leave product ordering/requests to [name]  for now. I was going to look at Dermagraft 06/18/17-she is here in follow-up evaluation for bilateral foot wounds. She continues with hyperbaric therapy. She states she has been applying manuka honey to the right plantar foot and alternate manuka honey and silver alginate to the left foot, despite our orders. We will continue with same treatment plan and she will follo up next week. 06/25/17; I have reviewed [name] last note from 3/11. She has operative debridement in 2 days' time. By review his note apparently they're going to place there is skin over the majority of this wound which is a good choice. She has a small satellite area at the most proximal part of this wound on the left plantar foot. The area on the right  plantar foot we've been using silver alginate and it is close to healing. 07/02/17; unfortunately the patient was not easily approved for [name] proposed surgery. I'm not completely certain what the issue is. She has been using silver alginate to the wound she has completed a first course of hyperbarics. She is still on Levaquin and Flagyl. I have really lost track of the time course here.I suspect she should have another week to 2 of antibiotics. I'll need to see if she is followed up with infectious disease Dr. Megan Salon 07/09/17; the patient is followed up with Dr. Megan Salon. 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 She continues on Levaquin and metronidazole continuing both of these for now I think she is been on fr about 6 weeks. She still has some drainage but no pain. No fever. Her had been plans for her to go to the OR for operative debridement with her podiatrist [name], I am not exactly sure where that is. I'll probably slip a note to [name] today. I note that she follows with Dr. Dwyane Dee of endocrinology. We have her recertified for hyperbaric oxygen. I have not heard about Dermagraft however I'll see if [name] is planning a  skin substitute as well 07/16/17; the patient tells me she is just about out of [place]. I'll need to check Dr. Hale Bogus last notes on this. She states she has plenty of Flagyl however. She comes in today complaining of pain in the right lateral foot which she said lasted for about a day. The wound on the right foot is actually much more medially. She also tells me that the [facility] cost a lot of pain in the left foot wound and she turned back to silver alginate. Finally Dermagraft has a $481 per application co-pay. She cannot afford this 07/23/17; patient arrives today with the wound not much smaller. There is not much new to add. She has not heard from [name] all try to put in a call to them today. She was asking about Dermagraft again and she has an over $856 per application co-pay she states that she would be willing to try to do a payment plan. I been tried to avoid this. We've been using silver alginate, I'll change to [facility] 07/30/17-She is here in follow-up evaluation for left foot ulcer. She continues hyperbaric medicine. The left foot ulcer is stable we will continue with same treatment plan 08/06/17; she is here for evaluation of her left foot ulcer. Currently being treated for hyperbarics or underlying osteomyelitis. She is completed antibiotics. The left foot ulcer is better smaller with healthier looking granulation. For various reasons I am not really clear on we never got her back to the OR with [name]. He did not respond to my secure text message. Nevertheless I think that surgery on this point is not necessary nor am I completely clear that a skin substitute is necessary The patient is complaining about pain on the outside of her right foot. 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 There is no erythema. She also states the foot is warm versus her other part of her upper leg and this is largely true. It is not totally clear to me what's causing this. She  thinks it's different from her usual neuropathy pain 08/13/17; she arrives in clinic today with a small wound which is superficial on her right first metatarsal head. 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 She tells [place] she was up on her feet over the Mother's Day celebration. The large wound is on the left foot. Continues with hyperbarics for underlying osteomyelitis. We're using Hydrofera Blue. She asked me today about where we were with Dermagraft. I had actually excluded this because of the co-pay however she wants to assume this therefore I'll recheck the co-pay an order for next week. 08/20/17; the patient agreed to accept the co-pay of the first Apligraf which we applied today. She is disappointed she is finishing hyperbarics will run this through the insurance on the extent of the foot infection and the extent of the wound that she had however she is already had 60 dive's. Dermagraft No. 1 08/27/17; Dermagraft No. 2. She is not eligible for any more hyperbaric treatments this month. She reports a fair amount of drainage and she actually changed to the external dressings without disturbing the direct contact layer 09/03/17; the patient arrived in clinic today with the wound superficially looking quite healthy. Nice vibrant red tissue with some advancing epithelialization although not as much adherence of the flap as I might like. However she noted on her own fourth toe some bogginess and she brought that to our attention. Indeed this was boggy feeling like a possibility of subcutaneous fluid. 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 She is not been unwell. We've been using Dermagraft 09/10/17; the culture that I did not last week was MRSA. She saw Dr. Megan Salon this morning who is going to start her on vancomycin. I had sent him a secure a text message yesterday. I also spoke with her podiatric surgeon  [name] about surgery on this foot the options for conserving a functional foot etc. Promised me he would see her and will make back consultation today. Paradoxically her actual wound on the plantar aspect of her left foot looks really quite good. I had given her 5 days worth of Baxdella to cover her for MRSA. Her MRI came back showing osteomyelitis within the third metatarsal shaft and head and base of the third and fourth proximal phalanx. She had extensive inflammatory changes throughout the soft tissue of the lateral forefoot. With an ill-defined fluid around the fourth metatarsal extending into the plantar and dorsal soft tissues 09/19/17; the patient is actually on oral Septra and Flagyl. She apparently refused IV vancomycin. She also saw [name] at my request who is planning her for a left BKA sometime in mid July. MRI showed osteomyelitis within the third metatarsal shaft and head and the basis of the third and fourth proximal phalanx. I believe there was felt to be possible septic arthritis involving the third MTP. 09/26/17; the patient went back to Dr. Megan Salon at my suggestion and is now receiving IV daptomycin. Her wound continues to look quite good making the decision to proceed with a transmetatarsal amputation although more difficult for the patient. I believe in my extensive discussions with her she has a good sense of the pros and cons of this. I don't NV the tuft decision she has to make. She has an appointment with [name] I believe in mid July and I previously spoken to him about this issue Has we had used 3 previous Dermagraft. Given the condition of the wound surface I went ahead and added the fourth one today area and I did this not fully realizing that she'll be traveling  to [place] next week. I'm hopeful she can come back in 2 weeks 10/21/17; Her same Dermagraft on for about 3-1/2 weeks. In spite of this the wound arrives looking quite healthy. There is been a lot of  healing dimensions are smaller. Looking at the square shaped wound she has now there is some undermining and some depth medially under the undermining although I cannot palpate any bone. No surrounding infection is obvious. She has difficult questions about how to look at this going forward vis--vis amputations versus continued medical therapy. T be truthful the wound is looks o so healthy and it is continued to contract. Hard to justify foot surgery at this point although I still told her that I think it might come to that if we are not able to eradicate the underlying MRSA. She is still highly at risk and she understands this 11/06/17 on evaluation today patient appears to be doing better in regard to her foot ulcer. She's been tolerating the dressing changes without complication. Currently she is here for her Dermagraft #6. Her wound continues to make excellent progress at this point. She does not appear to have any evidence of infection which is good news. 11/13/17 on evaluation today patient appears to be doing excellent at this time. She is here for repeat Dermagraft application. This is #7. Overall her wound seems to be making great progress. 12/05/17; the patient arrives with the wound in much better condition than when I last saw this almost 6 weeks ago. She still has a small probing area in the left metatarsal head region on the lateral aspect of her foot. We applied her last Dermagraft today. Since the last time she is here she has what appears to a been a blood blister on the plantar aspect of left foot although I don't see this is threatening. There is also a thick raised tissue on the right mid metatarsal head region. This was not there I don't think the last time she was here 3 weeks ago. 12/12/17; the patient continues to have a small programming area in the left metatarsal head region on the lateral aspect of her foot which was the initial large surgical wound. I applied her last Apligraf  last week. I'm going to use Endoform starting today Unfortunately she has an excoriated area in the left mid foot and the right mid foot. The left midfoot looks like a blistered area this was not opened last week it certainly is open today. Using silver alginate on these areas. She promises me she is offloading this. 12/19/17; the small probing area in the left metatarsal head eyes think is shallower. In general her original wound looks better. We've been using Endoform. The area inferiorly that I think was trauma last week still requires debridement a lot of nonviable surface which I removed. She still has an open open area distally in her foot Similarly on the right foot there is tightly adherent surface debris which I removed. Still areas that don't look completely epithelialized. This is a small open area. We used silver alginate on these areas 12/26/2017; the patient did not have the supplies we ordered from last week including the Endoform. The original large wound on the left lateral foot looks healthy. She still has the undermining area that is largely unchanged from last week. She has the same heavily callused raised edged wounds on the right mid and left midfoot. Both of these requiring debridement. We have been using silver alginate on these areas 01/02/2018;  there is still supply issues. We are going to try to use Prisma but I am not sure she actually got it from what she is saying. She has a new open area on the lateral aspect of the left fourth toe [previous fifth ray amputation]. Still the one tunneling area over the fourth metatarsal head. The area is in the midfoot bilaterally still have thick callus around them. She is concerned about a raised swelling on the lateral aspect of the foot. However she is completely insensate 01/10/2018; we are using Prisma to the wounds on her bilateral feet. Surprisingly the tunneling area over the left fourth metatarsal head that was part of  her original surgery has closed down. She has a small open area remaining on the incision line. 2 open areas in the midfoot. 02/10/2018; the patient arrives back in clinic after a month hiatus. She was traveling to visit family in [place]. Is fairly clear she was not offloading the areas on her feet. The original wound over the left lateral foot at the level of metatarsal heads is reopened and probes medially by about a centimeter or 2. She notes that a week ago she had purulent drainage come out of an area on the left midfoot. Paradoxically the worst area is actually on the right foot is extensive with purulent drainage. We will use silver alginate today 02/17/2018; the patient has 3 wounds one over the left lateral foot. She still has a small area over the metatarsal heads which is the remnant of her original surgical wound. This has medial probing depth of roughly 1.4 cm somewhat better than last week. The area on the right foot is larger. We have been using silver alginate to all areas. The area on the right foot and left foot that we cultured last week showed both Klebsiella and Proteus. Both of these are quinolone sensitive. The patient put her's self on Bactrim and Flagyl that she had left hanging around from prior antibiotic usages. She was apparently on this last week when she arrived. I did not realize this. Unfortunately the Bactrim will not cover either 1 of these organisms. We will send in Cipro 500 twice daily for a week 03/04/2018; the patient has 2 wounds on the left foot one is the original wound which was a surgical wound for a deep DFU. At one point this had exposed bone. She still has an area over the fourth metatarsal head that probes about 1.4 cm although I think this is better than last week. I been using silver nitrate to try and promote tissue adherence and been using silver alginate here. She also has an area in the left midfoot. This has some depth but a small linear wound.  Still requiring debridement. On the right midfoot is a circular wound. A lot of thick callus around this area. We have been using silver alginate to all wound areas She is completed the ciprofloxacin I gave her 2 weeks ago. 03/11/2018; the patient continues to have 2 open areas on the left foot 1 of which was the original surgical wound for a deep DFU. Only a small probing area remains although this is not much different from last week we have been using silver alginate. The other area is on the midfoot this is smaller linear but still with some depth. We have been using silver alginate here as well On the right foot she has a small circular wound in the mid aspect. This is not much smaller than last  time. We have been using silver alginate here as well 03/18/2018; she has 3 wounds on the left foot the original surgical wound, a very superficial wound in the mid aspect and then finally the area in the mid plantar foot. She arrives in today with a very concerning area in the wound in the mid plantar foot which is her most proximal wound. There is undermining here of roughly 1-1/2 cm superiorly. Serosanguineous drainage. She tells me she had some pain on for over the weekend that shot up her foot into her thigh and she tells me that she had a nodule in the groin area. She has the single wound in the right foot. We are using endoform to both wound areas 03/24/2018; the patient arrives with the original surgical wound in the area on the left midfoot about the same as last week. There is a collection of fluid under the surface of the skin extending from the surgical wound towards the midfoot although it does not reach the midfoot wound. The area on the right foot is about the same. Cultures from last week of the left midfoot wound showed abundant Klebsiella abundant Enterococcus faecalis and moderate methicillin resistant staph I gave her Levaquin but this would have only covered the Klebsiella. She  will need linezolid 04/01/2018; she is taking linezolid but for the first few days only took 1 a day. I have advised her to finish this at twice daily dosing. In any case all of her wounds are a lot better especially on the left foot. The original surgical wound is closed. The area on the left midfoot considerably smaller. The area on the right foot also smaller. 04/08/2018; her original surgical wound/osteomyelitis on the left foot remains closed. She has area on the left foot that is in the midfoot area but she had some streaking towards this. This is not connected with her original wound at least not visually. Small wound on the right midfoot appears somewhat smaller. 04/15/18; both wounds looks better. Original wound is better left midfoot. Using silver alginate 1/21; patient states she uses saltwater soak in, stones or remove callus from around her wounds. She is also concerned about a blood blister she had on the left foot but it simply resolved on its own. We've been using silver alginate 1/28; the patient arrives today with the same streaking area from her metatarsals laterally [the site of her original surgical wound] down to the middle of her foot. There is some drainage in the subcutaneous area here. This concerns me that there is actually continued ongoing infection in the metatarsals probably the fourth and third. This fixates an MRI of the foot without contrast [chronic renal failure] The wound in the mid part of the foot is small but I wonder whether this area actually connects with the more distal foot. The area on the right midfoot is probably about the same. Callus thick skin around the small wound which I removed with a curette we have been using silver alginate on both wound areas 2/4; culture I did of the draining site on the left foot last time grew methicillin sensitive staph aureus. MRI of the left foot showed interval resolution of the findings surrounding the third metatarsal  joint on the prior study consistent with treated osteomyelitis. Chronic soft tissue ulceration in the plantar and lateral aspect of the forefoot without residual focal fluid collection. No evidence of recurrent osteomyelitis. Noted to have the previous amputation of the distal first phalanx and fifth  ray MRI of the right foot showed no evidence of osteomyelitis I am going to treat the patient with a prolonged course of antibiotics directed against MSSA in the left foot 2/11; patient continues on cephalexin. She tells me she had nausea and vomiting over the weekend and missed 2 days. In general her foot looks much the same. She has a small open area just below the left fourth metatarsal head. A linear area in the left midfoot. Some discoloration extending from the inferior part of this into the left lateral foot although this appears to be superficial. She has a small area on the right midfoot which generally looks smaller after debridement 2/18; the patient is completing his cephalexin and has another 2 days. She continues to have open areas on the left and right foot. 2/25; she is now off antibiotics. The area on the left foot at the site of her original surgical wound has closed yet again. She still has open areas in the mid part of her foot however these appear smaller. The area on the right mid foot looks about the same. We have been using silver alginate She tells me she had a serious hypoglycemic spell at home. She had to have EMS called and get IV dextrose 3/3; disappointing on the left lateral foot large area of necrotic tissue surrounding the linear area. This appears to track up towards the same original surgical wound. Required extensive debridement. The area on the right plantar foot is not a lot better also using silver 3/12; the culture I did last time showed abundant enterococcus. I have prescribed Augmentin, should cover any unrecognized anaerobes as well. In addition there were a few  MRSA and Serratia that would not be well covered although I did not want to give her multiple antibiotics. She comes in today with a new wound in the right midfoot this is not connected with the original wound over her MTP a lot of thick callus tissue around both wounds but once again she said she is not walking on these areas 3/17-Patient comes in for follow-up on the bilateral plantar wounds, the right midfoot and the left plantar wound. Both these are heavily callused surrounding the wounds. We are continuing to use silver alginate, she is compliant with offloading and states she uses a wheelchair fairly often at home 3/24; both wound areas have thick callus. However things actually look quite a bit better here for the majority of her left foot and the right foot. 3/31; patient continues to have thick callused somewhat irritated looking tissue around the wounds which individually are fairly superficial. There is no evidence of surrounding infection. We have been using silver alginate however I change that to [facility] today 4/17; patient returns to clinic after having a scare with Covid she tested negative in her primary doctor's office. She has been using Hydrofera Blue. She does not have an open area on the right foot. On the left foot she has a small open area with the mid area not completely viable. She showed me pictures of what looks like a hemorrhagic blister from several days ago but that seems to have healed over this was on the lateral left foot 4/21; patient comes in to clinic with both her wounds on her feet closed. However over the weekend she started having pain in her right foot and leg up into the thigh. She felt as though she was running a low-grade fever but did not take her temperature. She took a  doxycycline that she had leftover and yesterday a single Septra and metronidazole. She thinks things feel somewhat better. 4/28; duplex ultrasound I ordered last week was negative  for DVT or superficial thrombophlebitis. She is completed the doxycycline I gave her. States she is still having a lot of pain in the right calf and right ankle which is no better than last week. She cannot sleep. She also states she has a temperature of up to 101, coughing and complaining of visual loss in her bilateral eyes. Apparently she was tested for Covid 2 weeks ago at [facility] and that was negative. Readmission: 09/03/18 patient presents back for reevaluation after having been evaluated at the end of April regarding erythema and swelling of her right lower extremity. Subsequently she ended up going to the hospital on 07/29/18 and was admitted not to be discharged until 08/08/18. Unfortunately it was noted during the time that she was in the hospital that she did have methicillin-resistant Staphylococcus aureus as the infection noted at the site. It was also determined that she did have osteomyelitis which appears to be fairly significant. She was treated with vancomycin and in fact is still on IV vancomycin at dialysis currently. This is actually slated to continue until 09/12/18 at least which will be the completion of the six weeks of therapy. Nonetheless based on what I'm seeing at this point I'm not sure she will be anywhere near ready to discontinue antibiotics at that time. Since she was released from the hospital she was seen by [name] who is her podiatrist on 08/27/18. His note specifically states that he is recommended that the patient needs of one knee amputation on the right as she has a life- threatening situation that can lead quickly to sepsis. The patient advised she would like to try to save her leg to which [name] apparently told her that this was against all medical advice. She also want to discontinue the Wound VAC which had been initiated due to the fact that she wasn't pleased with how the wound was looking and subsequently she wanted to pursue applying Medihoney at that time.  He stated that he did not believe that the right lower extremity was salvageable and that the patient understood but would still like to attempt hyperbaric option therapy if it could be of any benefit. She was therefore referred back to [place] for further evaluation. He plans to see her back next week. Upon inspection today patient has a significant amount purulent drainage noted from the wound at this point. The bone in the distal portion of her foot also appears to be extremely necrotic and spongy. When I push down on the bone it bubbles and seeps purulent drainage from deeper in the end of the foot. I do not think that this is likely going to heal very well at all and less aggressive surgical debridement were undertaken more than what I believe we can likely do here in our office. 09/12/2018; I have not seen this patient since the most recent hospitalization although she was in our clinic last week. I have reviewed some of her records from a complex hospitalization. 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 There is situation was complicated by MRSA bacteremia and acute on chronic renal failure now on dialysis. She is receiving vancomycin at dialysis. We started her on Dakin's wet-to-dry last week she is changing this daily. There is still purulent drainage coming out of her foot.  Although she is apparently "agreeable" to a below-knee amputation which is been suggested by multiple clinicians she wants this to be done in [place]. She apparently has a telehealth visit with that provider sometime in late Tye 6/24. I have told her I think this is probably too long. Nevertheless I could not convince her to allow a local doctor to perform BKA. 09/19/2018; the patient has a large necrotic area on the right anterior foot. She has had previous transmetatarsal amputations. Culture I did last week showed MRSA nothing else she is on vancomycin at dialysis. She  has continued leaking purulent drainage out of the distal part of the large circular wound on the right anterior foot. She apparently went to see Dr. 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 Somehow that translated into her being referred to plastic surgery for debridement of the area. I gather she basically refused amputation although I do not have a copy of Dr. Berenice Primas notes. The patient really wants to have a trial of hyperbaric oxygen. I agreed with initial assessment in this clinic that this was probably too far along to benefit however if she is going to have plastic surgery I think she would benefit from ancillary hyperbaric oxygen. The issue here is that the patient has benefited as maximally as any patient I have ever seen from hyperbaric oxygen therapy. Most recently she had exposed bone on the lateral part of her left foot after a surgical procedure and that actually has closed. She has eschared areas in both heels but no open area. She is remained systemically well. I am not optimistic that anything can be done about this but the patient is very clear that she wants an attempt. The attempt would include a wound VAC further debridements and hyperbaric oxygen along with IV antibiotics. 6/26; I put her in for a trial of hyperbaric oxygen only because of the dramatic response she has had with wounds on her left midfoot earlier this year which was a surgical wound that went straight to her bone over the metatarsal heads and also remotely the left third toe. We will see if we can get this through our review process and insurance. She arrives in clinic with again purulent material pouring out of necrotic bone on the top of the foot distally. There is also some concerning erythema on the front of the leg that we marked. It is bit difficult to tell how tender this is because of neuropathy. I note from infectious disease that she had her vancomycin extended. All the  cultures of these areas have shown MRSA sensitive to vancomycin. She had the wound VAC on for part of the week. The rest of the time she is putting various things on this including Medihoney, "ionized water" silver sorb gel etc. 7/7; follow-up along with HBO. She is still on vancomycin at dialysis. She has a large open area on the dorsal right foot and a small dark eschar area on her heel. There is a lot less erythema in the area and a lot less tenderness. From an infection point of view I think this is better. She still has a lot of necrosis in the remaining right forefoot [previous TMA] we are still using the wound VAC in this area 7/16; follow-up along with HBO. I put her on linezolid after she finished her vancomycin. We started this last Friday I gave her 2 weeks worth. I had the expectation that she would be operatively debrided by Dr. Marla Roe  but that still has not happened yet. Patient phoned the office this week. She arrives for review today after HBO. The distal part of this wound is completely necrotic. Nonviable pieces of tendon bone was still purulent drainage. Also concerning that she has black eschar over the heel that is expanding. I think this may be indicative of infection in this area as well. She has less erythema and warmth in the ankle and calf but still an abnormal exam 7/21 follow-up along with HBO. I will renew her linezolid after checking a CBC with differential monitoring her blood counts especially her platelets. She was supposed to have surgery yesterday but if I am reading things correctly this was canceled after her blood sugar was found to be over 500. I thought Dr. Marla Roe who called me said that they were sending her to the ER but the patient states that was not the case. 7/28. Follow-up along with HBO. She is on linezolid I still do not have any lab work from dialysis even though I called last week. The patient is concerned about an area on her left lateral  foot about the level of the base of her fifth metatarsal. I did not really see anything that ominous here however this patient is in [place] ability to point out problems that she is sensing and she has been accurate in the past Finally she received a call from Dr. Marla Roe who is referring her to another orthopedic surgeon stating that she is too booked up to take her to the operating room now. Was still using a wound VAC on the foot 8/3 -Follow-up after HBO, she is got another week of linezolid, she is to call ID for an appointment, x-rays of both feet were reviewed, the left foot x-ray with third MTP joint osteo- Right foot x-ray widespread osteo-in the right midfoot Right ankle x-ray does not show any active evidence of infection 8/11-Patient is seen after HBO, the wounds on the right foot appear to be about the same, the heel wound had some necrotic base over tendon that was debrided with a curette 8/21; patient is seen after HBO. The patient's wound on her dorsal foot actually looks reasonably good and there is substantial amount of epithelialization however the open area distally still has a lot of necrotic debris partially bone. I cannot really get a good sense of just how deep this probes under the foot. She has been pressuring me this week to order medical maggots through a company in [place] for her. The problem I have is there is not a defined wound area here. On the positive side there is no purulence. She has been to see infectious disease she is still on Septra DS although I have not had a chance to review their notes 8/28; patient is seen in conjunction with HBO. The wounds on her foot continued to improve including the right dorsal foot substantially the, the distal part of this wound and the area on the right heel. We have been using a wound VAC over this chronically. She is still on trimethoprim as directed by infectious disease 9/4; patient is seen in conjunction with HBO.  Right dorsal foot wound substantially anteriorly is better however she continues to have a deep wound in the distal part of this that is not responding. We have been using silver collagen under border foam Area on the right plantar medial heel seems better. We have been using Hydrofera Blue 12/12/18 on evaluation today patient appears to be  doing about the same with regard to her wound based on prior measurements. She does have some necrotic tissue noted on the lateral aspect of the wound that is going require a little bit of sharp debridement today. This includes what appears to be potentially either severely necrotic bone or tendon. Nonetheless other than that she does not appear to have any severe infection which is good news 9/18; it is been 2 weeks since I saw this wound. She is tolerating HBO well. Continued dramatic improvement in the area on the right dorsal foot. She still has a small wound on the heel that we have been using Hydrofera Blue. She continues with a wound VAC 9/24; patient has to be seen emergently today with a swelling on her right lateral lower leg. She says that she told Dr. Evette Doffing about this and also myself on a couple of occasions but I really have no recollection of this. She is not systemically unwell and her wound really looked good the last time I saw this. She showed this to providers at dialysis and she was able to verify that she was started on cephalexin today for 5 doses at dialysis. She dialyzes on Tuesday Thursday and Saturday. 10/2; patient is seen in conjunction with HBO. The area that is draining on the right anterior medial tibia is more extensive. Copious amounts of serosanguineous drainage with some purulence. We are still using the wound VAC on the original wound then it is stable. Culture I did of the original IandD showed MRSA I contacted dialysis she is now on vancomycin with dialysis treatments. I asked them to run a month 10/9; patient seen in  conjunction with HBO. She had a new spontaneous open area just above the wound on the right medial tibia ankle. More swelling on the right medial tibia. Her wound on the foot looks about the same perhaps slightly better. There is no warmth spreading up her leg but no obvious erythema. her MRI of the foot and ankle and distal tib-fib is not booked for next Friday I discussed this with her in great detail over multiple days. it is likely she has spreading infection upper leg at least involving the distal 25% above the ankle. She knows that if I refer her to orthopedics for infectious disease they are going to recommend amputation and indeed I am not against this myself. We had a good trial at trying to heal the foot which is what she wanted along with antibiotics debridement and HBO however she clearly has spreading infection [probably staph aureus/MRSA]. Nevertheless she once again tells me she wants to wait the left of the MRI. She still makes comments about having her amputation done in [place]. 10/19; arrives today with significant swelling on the lateral right leg. Last culture I did showed Klebsiella. Multidrug-resistant. Cipro was intermediate sensitivity and that is what I have her on pending her MRI which apparently is going to be done on Thursday this week although this seems to be moving back and forth. She is not systemically unwell. We are using silver alginate on her major wound area on the right medial foot and the draining areas on the right lateral lower leg 10/26; MRI showed extensive abscess in the anterior compartment of the right leg also widespread osteomyelitis involving osseous structures of the midfoot and portions of the hindfoot. Also suspicion for osteomyelitis anterior aspect of the distal medial malleolus. Culture I did of the purulence once again showed a multidrug-resistant Klebsiella. I have been  in contact with nephrology late last week and she has been started on  cefepime at dialysis to replace the vancomycin We sent a copy of her MRI report to Dr. Geroge Baseman in [place] who is an orthopedic surgeon. The patient takes great stock in his opinion on this. She says she will go to [place] to have her leg amputated if Dr. Geroge Baseman does not feel there is any salvage options. 11/2; she still is not talk to her orthopedic surgeon in [place]. Apparently he will call her at [phone] this afternoon. The quality of this is she has not allowed me to refer her anywhere. She has been told over and over that she needs this amputated but has not agreed to be referred. She tells me her blood sugar was 600 last night but she has not been febrile. 11/9; she never did got a call from the orthopedic surgeon in [place] therefore that is off the radar. We have arranged to get her see orthopedic surgery at [facility]. She still has a lot of draining purulence coming out of the new abscess in her right leg although that probably came from the osteomyelitis in her right foot and heel. Meanwhile the original wound on the right foot looks very healthy. Continued improvement. The issue is that the last MRI showed osteomyelitis in her right foot extensively she now has an abscess in the right anterior lower leg. There is nobody in [place] who will offer this woman anything but an amputation and to be honest that is probably what she needs. I think she still wants to talk about limb salvage although at this point I just do not see that. She has completed her vancomycin at dialysis which was for the original staph aureus she is still on cefepime for the more recent Klebsiella. She has had a long course of both of these antibiotics which should have benefited the osteomyelitis on the right foot as well as the abscess. 11/16; apparently [place] elective surgery is shut down because of COVID-19 pandemic. I have reached out to some contacts at [facility] to see if we can get her an  orthopedic appointment there. I am concerned about continually leaving this but for the moment everything is static. In fact her original large wound on this foot is closing down. It is the abscess on the right anterior leg that continues to drain purulent serosanguineous material. She is not currently on any antibiotics however she had a prolonged course of vancomycin [1 month] as well as cefepime for a month 02/24/2019 on evaluation today patient appears to be doing better than the last time I saw her. This is not a patient that I typically see. With that being said I am covering for Dr. Dellia Nims this week and again compared to when I last saw her overall the wounds in particular seem to be doing significantly better which is good news. With that being said the patient tells me several disconcerting things. She has not been able to get in to see anyone for potential debridement in regard to her leg wounds although she tells me that she does not think it is necessary any longer because she is taking care of that herself. She noticed a string coming out of the lower wound on her leg over the last week. The patient states that she subsequently decided that we must of pack something in there and started pulling the string out and as it kept coming and coming she realized this was likely her  tendon. With that being said she continued to remove as much of this as she could. She then I subsequently proceeded to using tubes of antibiotic ointment which she will stick down into the wound and then scored as much as she can until she sees it coming out of the other wound opening. She states that in doing this she is actually made things better and there is less redness and irritation. With regard to her foot wound she does have some necrotic tendon and tissue noted in one small corner but again the actual wound itself seems to be doing better with good granulation in general compared to my last evaluation. 12/7;  continued improvement in the wound on the substantial part of the right medial foot. Still a necrotic area inferiorly that required debridement but the rest of this looks very healthy and is contracting. She has 2 wounds on the right lateral leg which were her original drainage sites from her abscess but all of this looks a lot better as well. She has been using silver alginate after putting antibiotic biotic ointment in one wound and watching it come out the other. I have talked to her in some detail today. I had given her names of orthopedic surgeons at [facility] for second opinion on what to do about the right leg. I do not think the patient never called them. She has not been able to get a hold of the orthopedic surgeon in [place] that she had put a lot of faith in as being somebody would give her an opinion that she would trust. I talked to her today and said even if I could get her in to another orthopedic surgeon about the leg which she accept an amputation and she said she would not therefore I am not going to press this issue for the moment 12/14; continued improvement in his substantial wound on the right medial foot. There is still a necrotic area inferiorly with tightly adherent necrotic debris which I have been working on debriding each time she is here. She does not have an orthopedic appointment. Since last time she was here I looked over her cultures which were essentially MRSA on the foot wound and gram-negative rods in the abscess on the anterior leg. 12/21; continued improvement in the area on the right medial foot. She is not up on this much and that is probably a good thing since I do not know it could support continuous ambulation. She has a small area on the right lateral leg which were remanence of the IandD's I did because of the abscess. I think she should probably have prophylactic antibiotics I am going to have to look this over to see if we can make an intelligent  decision here. In the meantime her major wound is come down nicely. Necrotic area inferiorly is still there but looks a lot better 04/06/2019; she has had some improvement in the overall surface area on the right medial foot somewhat narrowedr both but somewhat longer. The areas on the right lateral leg which were initial IandD sites are superficial. Nothing is present on the right heel. We are using silver alginate to the wound areas 1/18; right medial foot somewhat smaller. Still a deep probing area in the most distal recess of the wound. She has nothing open on the right leg. She has a new wound on the plantar aspect of her left fourth toe which may have come from just pulling skin. The patient using Medihoney on  the wound on her foot under silver alginate. I cannot discourage her from this 2/1; 2-week follow-up using silver alginate on the right foot and her left fourth toe. The area on the right dorsal foot is contracted although there is still the deep area in the most distal part of the wound but still has some probing depth. No overt infection 2/15; 2-week follow-up. She continues to have improvement in the surface area on the dorsal right foot. Even the tunneling area from last time is almost closed. The area that was on the plantar part of her left fourth toe over the PIP is indeed closed 3/1; 2-week follow-up. Continued improvement in surface area. The original divot that we have been debriding inferiorly I think has full epithelialization although the epithelialization is gone down into the wound with probably 4 mm of depth. Even under intense illumination I am unable to see anything open here. The remanence of the wound in this area actually look quite healthy. We have been using silver alginate 3/15; 2-week follow-up. Unfortunately not as good today. She has a comma shaped wound on the dorsal foot however the upper part of this is larger. Under illumination debris on the surface She also  tells [place] that she was on her right leg 2 times in the last couple of weeks mostly to reach up for things above her head etc. She felt a sharp pain in the right leg which she thinks is somewhere from the ankle to the knee. The patient has neuropathy and is really uncertain. She cannot feel her foot so she does not think it was coming from there 3/29; 2-week follow-up. Her wound measures smaller. Surface of the wound appears reasonable. She is using silver alginate with underlying Medihoney. She has home health. X-rays I did of her tib-fib last time were negative although it did show arterial calcification 4/12; 2-week follow-up. Her wound measures smaller in length. Using manuka honey with silver alginate on top. She has home health. 4/26; 2-week follow-up. Her wound is smaller but still very adherent debris under illumination requiring debridement she has been using manuka honey with silver alginate. 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 Been using Hydrofera Blue. She is on suppressive Bactrim but apparently she has been taking it twice daily 6/7; I have not seen this wound and about 6 weeks. Since then she was up in [place]. By her own admission she was walking on the foot because she did not have a wheelchair. The wound is not nearly as healthy looking as it was the last time I saw this. We ordered different things for her but she only uses Medihoney and silver alginate. As far as I know she is on suppressive trimethoprim sulfamethoxazole. She does not admit to any fever or chills. Her CBGs apparently are at baseline however she is saying that she feels some discomfort on the lateral part of her ankle I looked over her last inflammatory markers from the summer 2020 at which time she had a deeply necrotic infected wound in this area. On 11/10/2018 her sedimentation rate was 56 and C-reactive  protein 9.9. This was 107 and 29 on 07/29/2018. 6/17; the patient had a necrotic wound the last time she was here on the right dorsal foot. After debridement I did a culture. This showed a very resistant ESBL Klebsiella as well as Enterococcus. Her  x-ray of the foot which was done because of warmth and some discomfort showed bone destruction within the carpal bones involving the navicular acute cuboid lateral middle cuneiforms but essentially unchanged from her prior study which was done on 10/29/2018. The findings were felt to represent chronic osteomyelitis. We did inflammatory markers on her. Her white count was 5.25 sedimentation rate 16 and C-reactive protein at 11.1. Notable for the fact that in August 2020 her CRP was 9.9 and sedimentation rate 56. I have looked at her x-rays. It is true that the bone destruction is very impressive however the patient came into this clinic for the wound on her right foot with pieces of bone literally falling out anteriorly with purulent material. I am not exactly sure I could have expected anything different. She has not been systemically unwell no fever chills or blood sugars have been reasonable. 6/28; she arrives with a right heel closed. The substantial area on the right anterior foot looks healthy. Much better looking surface. I think we can change to [facility] seems to help this previously. She is getting her antibiotics at dialysis she should be just about finished 7/9; changed to [facility] last week. Surface wound looks satisfactory not much change in surface area however. She is going to [place] state next week this is usually a difficult thing for this patient follow-up will be for 2 weeks. 7/23; using Hydrofera Blue. She returns from her trip and the wound looks surprisingly good. Usually when this patient goes on trips she comes back with a lot of problems with the wound. She is saying that she sometimes feels an episodic "crunching"  feeling on the lateral part of the foot. She is neuropathic and not feeling pain but wonders whether this could be a neuropathic dysesthesia. 11/13/19-Patient returns after 3 weeks, the wound itself is stable and patient states that there is nothing new going on she is on some extra anxiety medications and is resisting the temptation to pick at the dry skin around the wound. 9/20; patient has not been here in over a month and I have not seen her in 2 months. The wound in terms of size I think is about the same. There is no exposed bone. She has a nonviable surface on this. She is supposed to be using [facility] however she is also been using some form of honey preparation as well as a silver-based dressings. I do not think she has any pattern to this. 10/4; 2-week follow-up. Patient has been using some form of spray which she says has honey and silver to purchase this online she has been covering it with gauze. In spite of this the wound actually looks quite good. The deeper divot distally appears to be close down. There is a rim of epithelialization. 10/18; 2-week follow-up. Patient has been using her Hydrofera Blue covered with her silver honey spray that she got online. 11/1; 2-week follow-up. She is using Hydrofera Blue with a silver honey spray. Wound bed is measuring smaller. She has noticed that her foot is warmer on the right. She is concerned about infection. For a long period of time I had her on prophylactic trimethoprim sulfamethoxazole DS 1 tablet daily. She is asking for this to be restarted. The patient is walking on this foot because of repairs that are being done in a home her but her room is on the second floor she has to go up and down stairs. I have cautioned against this however as  usual she will do exactly what she wants to do 11/15; 2-week follow-up. She uses Hydrofera Blue with a silver/honey spray which I have never heard of. I think her wound looks about the same.  Some epithelialization. No evidence that this is infected. I think she is walking on this more than we agreed on. She is going on extensive vacation over Thanksgiving Electronic Signature(s) Signed: 02/15/2020 5:39:18 PM By: Linton Ham MD Entered By: Linton Ham on 02/15/2020 13:55:03 -------------------------------------------------------------------------------- Physical Exam Details Patient Name: Date of Service: [name] 02/15/2020 12:30 PM Medical Record Number: [id number] Patient Account Number: [id number] Date of Birth/Sex: Treating RN: 04-18-1970 (49 y.o. [name] Primary Care Provider: [name], [name] Other Clinician: Referring Provider: Treating Provider/Extender: [name], [name] Weeks in Treatment: 9 Constitutional Patient is hypertensive.. Pulse regular and within target range for patient.Marland Kitchen Respirations regular, non-labored and within target range.. Patient is febrile today.Marland Kitchen Appears in no distress. Respiratory work of breathing is normal. Cardiovascular Salas pedis pulses are palpable. Notes Wound exam; the wound is about the same. Healthy looking granulation rims of epithelialization. There is no evidence of surrounding infection Electronic Signature(s) Signed: 02/15/2020 5:39:18 PM By: Linton Ham MD Entered By: Linton Ham on 02/15/2020 13:56:13 -------------------------------------------------------------------------------- Physician Orders Details Patient Name: Date of Service: [name] 02/15/2020 12:30 PM Medical Record Number: [id number] Patient Account Number: [id number] Date of Birth/Sex: Treating RN: May 16, 1970 (49 y.o. [name] Primary Care Provider: [name], [name] Other Clinician: Referring Provider: Treating Provider/Extender: [name], [name] Weeks in Treatment: 23 Verbal / Phone Orders: No Diagnosis Coding ICD-10 Coding Code Description [phone]  Other chronic osteomyelitis, right ankle and foot L97.514 Non-pressure chronic ulcer of other part of right foot with necrosis of bone L97.411 Non-pressure chronic ulcer of right heel and midfoot limited to breakdown of skin E10.621 Type 1 diabetes mellitus with foot ulcer Follow-up Appointments Return Appointment in 2 weeks. Dressing Change Frequency Wound #43 Right,Medial Foot Change Dressing every other day. Wound Cleansing Wound #43 Right,Medial Foot Clean wound with Wound Cleanser - or normal saline Primary Wound Dressing Wound #43 Right,Medial Foot Hydrofera Blue Secondary Dressing Wound #43 Right,Medial Foot Kerlix/Rolled Gauze Dry Gauze ABD pad Other: - ACE wrap Edema Control Elevate legs to the level of the heart or above for 30 minutes daily and/or when sitting, a frequency of: - throughout the day [facility] skilled nursing for wound care. - Interim Electronic Signature(s) Signed: 02/15/2020 5:39:18 PM By: Linton Ham MD Signed: 02/15/2020 5:51:53 PM By: [name] RN, BSN Entered By: [name] on 02/15/2020 13:17:33 -------------------------------------------------------------------------------- Problem List Details Patient Name: Date of Service: [name] 02/15/2020 12:30 PM Medical Record Number: [id number] Patient Account Number: [id number] Date of Birth/Sex: Treating RN: 02-12-1971 (49 y.o. [name] Primary Care Provider: [name], [name] Other Clinician: Referring Provider: Treating Provider/Extender: [name], [name] Weeks in Treatment: 4 Active Problems ICD-10 Encounter Code Description Active Date MDM Diagnosis M86.671 Other chronic osteomyelitis, right ankle and foot 09/03/2018 No Yes L97.514 Non-pressure chronic ulcer of other part of right foot with necrosis of bone 09/03/2018 No Yes L97.411 Non-pressure chronic ulcer of right heel and midfoot limited to breakdown of  09/17/2019 No Yes skin E10.621 Type 1 diabetes mellitus with foot ulcer 09/24/2018 No Yes Inactive Problems ICD-10 Code Description Active Date Inactive Date L97.521 Non-pressure chronic  ulcer of other part of left foot limited to breakdown of skin 04/20/2019 04/20/2019 L97.812 Non-pressure chronic ulcer of other part of right lower leg with fat layer exposed 02/24/2019 02/24/2019 Resolved Problems ICD-10 Code Description Active Date Resolved Date L02.415 Cutaneous abscess of right lower limb 12/25/2018 12/25/2018 Electronic Signature(s) Signed: 02/15/2020 5:39:18 PM By: Linton Ham MD Entered By: Linton Ham on 02/15/2020 13:54:02 -------------------------------------------------------------------------------- Progress Note Details Patient Name: Date of Service: Henry, [name] 02/15/2020 12:30 PM Medical Record Number: [id number] Patient Account Number: [id number] Date of Birth/Sex: Treating RN: 06-13-1970 (49 y.o. [name] Primary Care Provider: [name], [name] Other Clinician: Referring Provider: Treating Provider/Extender: [name], [name] Weeks in Treatment: 49 Subjective History of Present Illness (HPI) 49 year old diabetic who is known to have type 1 diabetes which is poorly controlled last hemoglobin A1c was 11%. She comes in with a ulcerated area on the left lateral foot which has been there for over 6 months. Was recently she has been treated by [name] of podiatry who saw her last on 05/28/2016. Review of his notes revealed that the patient had incision and drainage with placement of antibiotic beads to the left foot on 04/11/2016 for possible osteomyelitis of the cuboid bone. Over the last year she's had a history of amputation of the left fifth toe and a femoropopliteal popliteal bypass graft somewhere in April 2017. 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 His note [name] mentions that the patient has been  referred to me for further wound care and possibly great candidate for hyperbaric oxygen therapy due to recurrent osteomyelitis. However we do not have any x-rays of biopsy reports confirming this. He has been on several antibiotics including Bactrim and most recently is on doxycycline for an MRSA. I understand, the patient was not a candidate for IV antibiotics as she has had previous PICC lines which resulted in blood clots in both arms. There was a x-ray report dated 04/04/2016 on [name] notes which showed evidence of fifth ray resection left foot with osteolytic changes noted to the fourth metatarsal and cuboid bone on the left. 06/13/2016 -- had a left foot x-ray which showed no acute fracture or dislocation and no definite radiographic evidence of osteomyelitis. Advanced osteopenia was seen. 06/20/2016 -- she has noticed a new wound on the right plantar foot in the region where she had a callus before. 06/27/16- the patient did have her x-ray of the right foot which showed no findings to suggest osteomyelitis. She saw her endocrinologist, [name], yesterday. Her A1c in January was 11. He also indicates mismanagement and noncompliance regarding her diabetes. She is currently on Bactrim for a lip infection. She is complaining of nausea, vomiting and diarrhea. She is unable to articulate the exact orders or dosing of the Bactrim; it is unclear when she will complete this. 07/04/2016 -- results from [facility] of ABIs with ankle waveforms were noted from 02/14/2016. The examination done on 06/27/2015 showed noncompressible ABIs with the right being 1.45 and the left being 1.33. The present examination showed a right ABI of 1.19 on the left of 1.33. The conclusion was that right normal ABI in the lower extremity at rest however compared to previous study which was noncompressible ABI may be falsely elevated side suggesting medial calcification. The left ABI suggested medial  calcification. 08/01/2016 -- the patient had more redness and pain on her right foot and did not get to come to  see as noted she see her PCP or go to the ER and decided to take some leftover metronidazole which she had at home. As usual, the patient does report she feels and is rather noncompliant. 08/08/2016 -- -- x-ray of the right foot -- FINDINGS:Transmetatarsal amputation is noted. No bony destruction is noted to suggest osteomyelitis. IMPRESSION: No evidence of osteomyelitis. Postsurgical changes are seen. MRI would be more sensitive for possible bony changes. Culture has grown Serratia Marcescens -- sensitive to Bactrim, ciprofloxacin, ceftazidime she was seen by [name] on 08/06/2016. He did not find any exposed bone, muscle, tendon, ligament or joint. There was no malodor and he did a excisional debridement in the office. ============ Old notes: 49 year old patient who is known to the wound clinic for a while had been away from the wound clinic since 09/01/2014. Over the last several months she has been admitted to various hospitals including [facility]. She was treated for a right metatarsal osteomyelitis with a transmetatarsal amputation and this was done about 2 months ago. He has a small ulcerated area on the right heel and she continues to have an ulcerated area on the left plantar aspect of the foot. The patient was recently admitted to the [facility] hospital group between 7/12 and 10/18/2014. she was given 3 weeks of IV vancomycin and was to follow-up with her surgeons at [facility] and also took oral vancomycin for C. difficile colitis. 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 MRI of the left foot done on 10/14/2014 showed no abscess or osteomyelitis. 04/27/15; this is a patient we know from previous stays in the wound care center. She is a type I diabetic I am not sure of her control currently. Since the last time I saw her she is had a right transmetatarsal amputation and has no wounds on her right foot and has no open wounds. She is been followed at the wound care center at [facility] in [place]. She comes today with the desire to undergo hyperbaric treatment locally. Apparently one of her wound care providers in [place] has suggested hyperbarics. This is in response to an MRI from 04/18/15 that showed increased marrow signal and loss of the proximal fifth metatarsal cortex evidence of osteomyelitis with likely early osteomyelitis in the cuboid bone as well. She has a large wound over the base of the fifth metatarsal. She also has a eschar over her the tips of her toes on 1,3 and 5. She does not have peripheral pulses and apparently is going for an angiogram tomorrow which seems reasonable. After this she is going to infectious disease at [facility]. They have been using Medihoney to the large wound on the lateral aspect of the left foot to. The patient has known Charcot deformity from diabetic neuropathy. She also has known diabetic PAD. Surprisingly I can't see that she has had any recent antibiotics, the patient states the last antibiotic she had was at the end of November for 10 days. I think this was in response to culture that showed group G strep although I'm not exactly sure where the culture was from. She is also had arterial studies on 03/29/15. This showed a right ABI of 1.4 that was noncompressible. Her left ABI was 0.73. There was a suggestion  of superficial femoral artery occlusion. It was not felt that arterial inflow was adequate for healing of a foot ulcer. Her Doppler waveforms looked monophasic ===== READMISSION 02/28/17; this  is in an now 49 year old woman we've had at several different occasions in this clinic. 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 She has a remote ex-smoker. She was last seen in this clinic by Dr. Con Memos I think in May. More recently she is been followed by her podiatrist [name] an infectious disease Dr. Megan Salon. She has 2 open wounds the major one is over the right first metatarsal head she also has a wound on the left plantar foot. an MRI of the right foot on 01/01/17 showed a soft tissue ulcer along the plantar aspect of the first metatarsal base consistent with osteomyelitis of the first metatarsal stump. Dr. 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 According to the patient this is been open for slightly over a month. She has been on a combination of Cipro 500 twice a day, Zyvox 600 twice a day and Flagyl 500 3 times a day for over a month now as directed by Dr. Megan Salon. cultures of the right foot earlier this year showed MRSA in January and Serratia in May. January also had a few viridans strep. Recent x-rays of both feet were done and [name] office and I don't have these reports. The patient has known PAD and has a history of aleft femoropopliteal bypass in April 2017. 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 Dwyane Dee. In spite of this the last hemoglobin A1c I can see is 10.1 on 01/01/2017. She is being referred by 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 She is been using Medihoney to this area and also an area on the left midfoot. She is using healing sandals bilaterally. ABIs in this clinic at the left posterior tibial was 1.1  noncompressible on the right READMISSION Non invasive vascular NOVANT 5/18 Aftercare following surgery of the circulatory system Procedure Note - Interface, External Ris In - 08/13/2016 11:05 AM EDT Procedure: Examination consists of physiologic resting arterial pressures of the brachial and ankle arteries bilaterally with continuous wave Doppler waveform analysis. Previous: Previous exam performed on 02/14/16 demonstrated ABIs of Rt = 1.19 and Lt = 1.33. Right: ABI = non-compressible PT 1.47 DP. S/P transmet amputation. , Left: ABI = 1.52, 2nd digit pressure = 87 mmHg Conclusions: Right: ABI (>1.3) may be falsely elevated, suggesting medial calcification. Left: ABI (>1.3) may be falsely elevated, suggesting medial calcification The patient is a now 49 year old type I diabetic is had multiple issues her graded to chronic diabetic foot ulcers. 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 We had her in the clinic lastin November. At that point she had wounds on her bilateral feet.she had wanted to try hyperbarics however the healogics review process denied her because she hadn't followed up with her vascular surgeon for her left femoropopliteal bypass. The bypass was done by Dr. Raul Del at [facility]. We made her a follow-up with Dr. Raul Del however she did not keep the appointment and therefore she was not approved The  patient shows me a small wound on her left fourth metatarsal head on her phone. She developed rapid discoloration in the plantar aspect of the left foot and she was admitted to hospital from 2/2 through 05/10/17 with wet gangrene of the left foot osteomyelitis of the fourth metatarsal heads. She was admitted acutely ill with a temperature of 103. She was started on broad-spectrum vancomycin and cefepime. On 05/06/17 she was taken to the OR by [name] her podiatric surgeon for an incision and drainage  irrigation of the left foot wound. Cultures from this surgery revealed group be strep and anaerobes. she was seen by Dr.Xu of orthopedic surgery and scheduled for a below-knee amputation which she u refused. Ultimately she was discharged on Levaquin and Flagyl for one month. MRI 05/05/17 done while she was in the hospital showed abscess adjacent to the fourth metatarsal head and neck small abscess around the fourth flexor tendon. Inflammatory phlegmon and gas in the soft tissues along the lateral aspect of the fourth phalanx. Findings worrisome for osteomyelitis involving the fourth proximal and middle phalanx and also the third and fourth metatarsals. Finally the patient had actually shortly before this followed up with Dr. Raul Del at no time on 04/29/17. He felt that her left femoropopliteal bypass was patent he felt that her left-sided toe pressures more than adequate for healing a wound on the left foot. This was before her acute presentation. 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 05/28/17; she is started hyperbarics. The patient tells me that for some reason she was not actually on Levaquin but I think on ciprofloxacin. She was on Flagyl. She only started her Levaquin yesterday due to some difficulty with the pharmacy and perhaps her sister picking it up. She has an appointment with [name] tomorrow and with infectious disease early next week. She has no new complaints 06/06/17; the patient continues in hyperbarics. She saw [name] on 05/29/17 who is her podiatric surgeon. He is elected for a transmetatarsal amputation on 06/27/17. I'm not sure at what level he plans to do this amputation. The patient is unaware ooShe also saw Dr. Megan Salon of infectious disease who elected to continue her on current antibiotics I think this is ciprofloxacin and Flagyl. I'll need to clarify with her tomorrow if she actually has this. We're using silver alginate to the actual wound. Necrotic surface today with  material under the flap of her foot. ooOriginal MRI showed abscesses as well as osteomyelitis of the proximal and middle fourth phalanx and the third and fourth metatarsal heads 06/11/17; patient continues in hyperbarics and continues on oral antibiotics. She is doing well. The wound looks better. The necrotic part of this under the flap in her superior foot also looks better. she is been to see [name]. I haven't had a chance to look at his note. Apparently he has put the transmetatarsal amputation on hold her request it is still planning to take her to the OR for debridement and product application ACEL. I'll see if I can find his note. I'll therefore leave product ordering/requests to [name] for now. I was going to look at Dermagraft 06/18/17-she is here in follow-up evaluation for bilateral foot wounds. She continues with hyperbaric therapy. She states she has been applying manuka honey to the right plantar foot and alternate manuka honey and silver alginate to the left foot, despite our orders. We will continue with same treatment plan and she will follo up next week. 06/25/17; I have reviewed Dr.  Evans last note from 3/11. She has operative debridement in 2 days' time. By review his note apparently they're going to place there is skin over the majority of this wound which is a good choice. She has a small satellite area at the most proximal part of this wound on the left plantar foot. The area on the right plantar foot we've been using silver alginate and it is close to healing. 07/02/17; unfortunately the patient was not easily approved for [name] proposed surgery. I'm not completely certain what the issue is. She has been using silver alginate to the wound she has completed a first course of hyperbarics. She is still on Levaquin and Flagyl. I have really lost track of the time course here.I suspect she should have another week to 2 of antibiotics. I'll need to see if she is followed up with  infectious disease Dr. Megan Salon 07/09/17; the patient is followed up with Dr. Megan Salon. 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 She continues on Levaquin and metronidazole continuing both of these for now I think she is been on fr about 6 weeks. She still has some drainage but no pain. No fever. Her had been plans for her to go to the OR for operative debridement with her podiatrist [name], I am not exactly sure where that is. I'll probably slip a note to [name] today. I note that she follows with Dr. Dwyane Dee of endocrinology. We have her recertified for hyperbaric oxygen. I have not heard about Dermagraft however I'll see if [name] is planning a skin substitute as well 07/16/17; the patient tells me she is just about out of Starke. I'll need to check Dr. Hale Bogus last notes on this. She states she has plenty of Flagyl however. She comes in today complaining of pain in the right lateral foot which she said lasted for about a day. The wound on the right foot is actually much more medially. She also tells me that the [facility] cost a lot of pain in the left foot wound and she turned back to silver alginate. Finally Dermagraft has a $761 per application co-pay. She cannot afford this 07/23/17; patient arrives today with the wound not much smaller. There is not much new to add. She has not heard from [name] all try to put in a call to them today. She was asking about Dermagraft again and she has an over $950 per application co-pay she states that she would be willing to try to do a payment plan. I been tried to avoid this. We've been using silver alginate, I'll change to [facility] 07/30/17-She is here in follow-up evaluation for left foot ulcer. She continues hyperbaric medicine. The left foot ulcer is stable we will continue with same treatment plan 08/06/17; she is here for evaluation of her left foot ulcer. Currently being treated for  hyperbarics or underlying osteomyelitis. She is completed antibiotics. The left foot ulcer is better smaller with healthier looking granulation. For various reasons I am not really clear on we never got her back to the OR with [name]. He did not respond to my secure text message. Nevertheless I think that surgery on this point is not necessary nor am I completely clear that a skin substitute is necessary The patient is complaining about pain on the outside of her right foot. 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 There is no erythema. She also states the  foot is warm versus her other part of her upper leg and this is largely true. It is not totally clear to me what's causing this. She thinks it's different from her usual neuropathy pain 08/13/17; she arrives in clinic today with a small wound which is superficial on her right first metatarsal head. 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 She tells [place] she was up on her feet over the Mother's Day celebration. ooThe large wound is on the left foot. Continues with hyperbarics for underlying osteomyelitis. We're using Hydrofera Blue. She asked me today about where we were with Dermagraft. I had actually excluded this because of the co-pay however she wants to assume this therefore I'll recheck the co-pay an order for next week. 08/20/17; the patient agreed to accept the co-pay of the first Apligraf which we applied today. She is disappointed she is finishing hyperbarics will run this through the insurance on the extent of the foot infection and the extent of the wound that she had however she is already had 60 dive's. Dermagraft No. 1 08/27/17; Dermagraft No. 2. She is not eligible for any more hyperbaric treatments this month. She reports a fair amount of drainage and she actually changed to the external dressings without disturbing the direct contact layer 09/03/17; the patient arrived in clinic today with the wound  superficially looking quite healthy. Nice vibrant red tissue with some advancing epithelialization although not as much adherence of the flap as I might like. However she noted on her own fourth toe some bogginess and she brought that to our attention. Indeed this was boggy feeling like a possibility of subcutaneous fluid. 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 She is not been unwell. We've been using Dermagraft 09/10/17; the culture that I did not last week was MRSA. She saw Dr. Megan Salon this morning who is going to start her on vancomycin. I had sent him a secure a text message yesterday. I also spoke with her podiatric surgeon [name] about surgery on this foot the options for conserving a functional foot etc. Promised me he would see her and will make back consultation today. Paradoxically her actual wound on the plantar aspect of her left foot looks really quite good. I had given her 5 days worth of Baxdella to cover her for MRSA. Her MRI came back showing osteomyelitis within the third metatarsal shaft and head and base of the third and fourth proximal phalanx. She had extensive inflammatory changes throughout the soft tissue of the lateral forefoot. With an ill-defined fluid around the fourth metatarsal extending into the plantar and dorsal soft tissues 09/19/17; the patient is actually on oral Septra and Flagyl. She apparently refused IV vancomycin. She also saw [name] at my request who is planning her for a left BKA sometime in mid July. MRI showed osteomyelitis within the third metatarsal shaft and head and the basis of the third and fourth proximal phalanx. I believe there was felt to be possible septic arthritis involving the third MTP. 09/26/17; the patient went back to Dr. Megan Salon at my suggestion and is now receiving IV daptomycin. Her wound continues to look quite good making the decision to proceed with a  transmetatarsal amputation although more difficult for the patient. I believe in my extensive discussions with her she has a good sense of the pros and cons of this. I don't NV the tuft  decision she has to make. She has an appointment with [name] I believe in mid July and I previously spoken to him about this issue Has we had used 3 previous Dermagraft. Given the condition of the wound surface I went ahead and added the fourth one today area and I did this not fully realizing that she'll be traveling to [place] next week. I'm hopeful she can come back in 2 weeks 10/21/17; Her same Dermagraft on for about 3-1/2 weeks. In spite of this the wound arrives looking quite healthy. There is been a lot of healing dimensions are smaller. Looking at the square shaped wound she has now there is some undermining and some depth medially under the undermining although I cannot palpate any bone. No surrounding infection is obvious. She has difficult questions about how to look at this going forward vis--vis amputations versus continued medical therapy. T be truthful the wound is looks so o healthy and it is continued to contract. Hard to justify foot surgery at this point although I still told her that I think it might come to that if we are not able to eradicate the underlying MRSA. She is still highly at risk and she understands this 11/06/17 on evaluation today patient appears to be doing better in regard to her foot ulcer. She's been tolerating the dressing changes without complication. Currently she is here for her Dermagraft #6. Her wound continues to make excellent progress at this point. She does not appear to have any evidence of infection which is good news. 11/13/17 on evaluation today patient appears to be doing excellent at this time. She is here for repeat Dermagraft application. This is #7. Overall her wound seems to be making great progress. 12/05/17; the patient arrives with the wound in much better  condition than when I last saw this almost 6 weeks ago. She still has a small probing area in the left metatarsal head region on the lateral aspect of her foot. We applied her last Dermagraft today. ooSince the last time she is here she has what appears to a been a blood blister on the plantar aspect of left foot although I don't see this is threatening. There is also a thick raised tissue on the right mid metatarsal head region. This was not there I don't think the last time she was here 3 weeks ago. 12/12/17; the patient continues to have a small programming area in the left metatarsal head region on the lateral aspect of her foot which was the initial large surgical wound. I applied her last Apligraf last week. I'm going to use Endoform starting today ooUnfortunately she has an excoriated area in the left mid foot and the right mid foot. The left midfoot looks like a blistered area this was not opened last week it certainly is open today. Using silver alginate on these areas. She promises me she is offloading this. 12/19/17; the small probing area in the left metatarsal head eyes think is shallower. In general her original wound looks better. We've been using Endoform. The area inferiorly that I think was trauma last week still requires debridement a lot of nonviable surface which I removed. She still has an open open area distally in her foot ooSimilarly on the right foot there is tightly adherent surface debris which I removed. Still areas that don't look completely epithelialized. This is a small open area. We used silver alginate on these areas 12/26/2017; the patient did not have the supplies we ordered from  last week including the Endoform. The original large wound on the left lateral foot looks healthy. She still has the undermining area that is largely unchanged from last week. She has the same heavily callused raised edged wounds on the right mid and left midfoot. Both of these requiring  debridement. We have been using silver alginate on these areas 01/02/2018; there is still supply issues. We are going to try to use Prisma but I am not sure she actually got it from what she is saying. She has a new open area on the lateral aspect of the left fourth toe [previous fifth ray amputation]. Still the one tunneling area over the fourth metatarsal head. The area is in the midfoot bilaterally still have thick callus around them. She is concerned about a raised swelling on the lateral aspect of the foot. However she is completely insensate 01/10/2018; we are using Prisma to the wounds on her bilateral feet. Surprisingly the tunneling area over the left fourth metatarsal head that was part of her original surgery has closed down. She has a small open area remaining on the incision line. 2 open areas in the midfoot. 02/10/2018; the patient arrives back in clinic after a month hiatus. She was traveling to visit family in [place]. Is fairly clear she was not offloading the areas on her feet. The original wound over the left lateral foot at the level of metatarsal heads is reopened and probes medially by about a centimeter or 2. She notes that a week ago she had purulent drainage come out of an area on the left midfoot. Paradoxically the worst area is actually on the right foot is extensive with purulent drainage. We will use silver alginate today 02/17/2018; the patient has 3 wounds one over the left lateral foot. She still has a small area over the metatarsal heads which is the remnant of her original surgical wound. This has medial probing depth of roughly 1.4 cm somewhat better than last week. The area on the right foot is larger. We have been using silver alginate to all areas. The area on the right foot and left foot that we cultured last week showed both Klebsiella and Proteus. Both of these are quinolone sensitive. The patient put her's self on Bactrim and Flagyl that she had left hanging  around from prior antibiotic usages. She was apparently on this last week when she arrived. I did not realize this. Unfortunately the Bactrim will not cover either 1 of these organisms. We will send in Cipro 500 twice daily for a week 03/04/2018; the patient has 2 wounds on the left foot one is the original wound which was a surgical wound for a deep DFU. At one point this had exposed bone. She still has an area over the fourth metatarsal head that probes about 1.4 cm although I think this is better than last week. I been using silver nitrate to try and promote tissue adherence and been using silver alginate here. ooShe also has an area in the left midfoot. This has some depth but a small linear wound. Still requiring debridement. ooOn the right midfoot is a circular wound. A lot of thick callus around this area. ooWe have been using silver alginate to all wound areas ooShe is completed the ciprofloxacin I gave her 2 weeks ago. 03/11/2018; the patient continues to have 2 open areas on the left foot 1 of which was the original surgical wound for a deep DFU. Only a small probing area remains although  this is not much different from last week we have been using silver alginate. The other area is on the midfoot this is smaller linear but still with some depth. We have been using silver alginate here as well ooOn the right foot she has a small circular wound in the mid aspect. This is not much smaller than last time. We have been using silver alginate here as well 03/18/2018; she has 3 wounds on the left foot the original surgical wound, a very superficial wound in the mid aspect and then finally the area in the mid plantar foot. She arrives in today with a very concerning area in the wound in the mid plantar foot which is her most proximal wound. There is undermining here of roughly 1-1/2 cm superiorly. Serosanguineous drainage. She tells me she had some pain on for over the weekend that shot up her  foot into her thigh and she tells me that she had a nodule in the groin area. ooShe has the single wound in the right foot. ooWe are using endoform to both wound areas 03/24/2018; the patient arrives with the original surgical wound in the area on the left midfoot about the same as last week. There is a collection of fluid under the surface of the skin extending from the surgical wound towards the midfoot although it does not reach the midfoot wound. The area on the right foot is about the same. Cultures from last week of the left midfoot wound showed abundant Klebsiella abundant Enterococcus faecalis and moderate methicillin resistant staph I gave her Levaquin but this would have only covered the Klebsiella. She will need linezolid 04/01/2018; she is taking linezolid but for the first few days only took 1 a day. I have advised her to finish this at twice daily dosing. In any case all of her wounds are a lot better especially on the left foot. The original surgical wound is closed. The area on the left midfoot considerably smaller. The area on the right foot also smaller. 04/08/2018; her original surgical wound/osteomyelitis on the left foot remains closed. She has area on the left foot that is in the midfoot area but she had some streaking towards this. This is not connected with her original wound at least not visually. ooSmall wound on the right midfoot appears somewhat smaller. 04/15/18; both wounds looks better. Original wound is better left midfoot. Using silver alginate 1/21; patient states she uses saltwater soak in, stones or remove callus from around her wounds. She is also concerned about a blood blister she had on the left foot but it simply resolved on its own. We've been using silver alginate 1/28; the patient arrives today with the same streaking area from her metatarsals laterally [the site of her original surgical wound] down to the middle of her foot. There is some drainage in  the subcutaneous area here. This concerns me that there is actually continued ongoing infection in the metatarsals probably the fourth and third. This fixates an MRI of the foot without contrast [chronic renal failure] ooThe wound in the mid part of the foot is small but I wonder whether this area actually connects with the more distal foot. ooThe area on the right midfoot is probably about the same. Callus thick skin around the small wound which I removed with a curette we have been using silver alginate on both wound areas 2/4; culture I did of the draining site on the left foot last time grew methicillin sensitive staph aureus.  MRI of the left foot showed interval resolution of the findings surrounding the third metatarsal joint on the prior study consistent with treated osteomyelitis. Chronic soft tissue ulceration in the plantar and lateral aspect of the forefoot without residual focal fluid collection. No evidence of recurrent osteomyelitis. 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 She tells me she had nausea and vomiting over the weekend and missed 2 days. In general her foot looks much the same. She has a small open area just below the left fourth metatarsal head. A linear area in the left midfoot. Some discoloration extending from the inferior part of this into the left lateral foot although this appears to be superficial. She has a small area on the right midfoot which generally looks smaller after debridement 2/18; the patient is completing his cephalexin and has another 2 days. She continues to have open areas on the left and right foot. 2/25; she is now off antibiotics. The area on the left foot at the site of her original surgical wound has  closed yet again. She still has open areas in the mid part of her foot however these appear smaller. The area on the right mid foot looks about the same. We have been using silver alginate She tells me she had a serious hypoglycemic spell at home. She had to have EMS called and get IV dextrose 3/3; disappointing on the left lateral foot large area of necrotic tissue surrounding the linear area. This appears to track up towards the same original surgical wound. Required extensive debridement. The area on the right plantar foot is not a lot better also using silver 3/12; the culture I did last time showed abundant enterococcus. I have prescribed Augmentin, should cover any unrecognized anaerobes as well. In addition there were a few MRSA and Serratia that would not be well covered although I did not want to give her multiple antibiotics. She comes in today with a new wound in the right midfoot this is not connected with the original wound over her MTP a lot of thick callus tissue around both wounds but once again she said she is not walking on these areas 3/17-Patient comes in for follow-up on the bilateral plantar wounds, the right midfoot and the left plantar wound. Both these are heavily callused surrounding the wounds. We are continuing to use silver alginate, she is compliant with offloading and states she uses a wheelchair fairly often at home 3/24; both wound areas have thick callus. However things actually look quite a bit better here for the majority of her left foot and the right foot. 3/31; patient continues to have thick callused somewhat irritated looking tissue around the wounds which individually are fairly superficial. There is no evidence of surrounding infection. We have been using silver alginate however I change that to [facility] today 4/17; patient returns to clinic after having a scare with Covid she tested negative in her primary doctor's office. She has been using Hydrofera  Blue. She does not have an open area on the right foot. On the left foot she has a small open area with the mid area not completely viable. She showed me pictures of what looks like a hemorrhagic blister from several days ago but that seems to  have healed over this was on the lateral left foot 4/21; patient comes in to clinic with both her wounds on her feet closed. However over the weekend she started having pain in her right foot and leg up into the thigh. She felt as though she was running a low-grade fever but did not take her temperature. She took a doxycycline that she had leftover and yesterday a single Septra and metronidazole. She thinks things feel somewhat better. 4/28; duplex ultrasound I ordered last week was negative for DVT or superficial thrombophlebitis. She is completed the doxycycline I gave her. States she is still having a lot of pain in the right calf and right ankle which is no better than last week. She cannot sleep. She also states she has a temperature of up to 101, coughing and complaining of visual loss in her bilateral eyes. Apparently she was tested for Covid 2 weeks ago at [facility] and that was negative. Readmission: 09/03/18 patient presents back for reevaluation after having been evaluated at the end of April regarding erythema and swelling of her right lower extremity. Subsequently she ended up going to the hospital on 07/29/18 and was admitted not to be discharged until 08/08/18. Unfortunately it was noted during the time that she was in the hospital that she did have methicillin-resistant Staphylococcus aureus as the infection noted at the site. It was also determined that she did have osteomyelitis which appears to be fairly significant. She was treated with vancomycin and in fact is still on IV vancomycin at dialysis currently. This is actually slated to continue until 09/12/18 at least which will be the completion of the six weeks of therapy. Nonetheless based on what I'm  seeing at this point I'm not sure she will be anywhere near ready to discontinue antibiotics at that time. Since she was released from the hospital she was seen by [name] who is her podiatrist on 08/27/18. His note specifically states that he is recommended that the patient needs of one knee amputation on the right as she has a life- threatening situation that can lead quickly to sepsis. The patient advised she would like to try to save her leg to which [name] apparently told her that this was against all medical advice. She also want to discontinue the Wound VAC which had been initiated due to the fact that she wasn't pleased with how the wound was looking and subsequently she wanted to pursue applying Medihoney at that time. He stated that he did not believe that the right lower extremity was salvageable and that the patient understood but would still like to attempt hyperbaric option therapy if it could be of any benefit. She was therefore referred back to [place] for further evaluation. He plans to see her back next week. Upon inspection today patient has a significant amount purulent drainage noted from the wound at this point. The bone in the distal portion of her foot also appears to be extremely necrotic and spongy. When I push down on the bone it bubbles and seeps purulent drainage from deeper in the end of the foot. I do not think that this is likely going to heal very well at all and less aggressive surgical debridement were undertaken more than what I believe we can likely do here in our office. 09/12/2018; I have not seen this patient since the most recent hospitalization although she was in our clinic last week. I have reviewed some of her records from a complex hospitalization. 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 There is situation was complicated by MRSA bacteremia and acute on chronic renal failure now on dialysis. She is receiving  vancomycin at dialysis. We started her on Dakin's wet-to-dry last week she is changing this daily. There is still purulent drainage coming out of her foot. Although she is apparently "agreeable" to a below-knee amputation which is been suggested by multiple clinicians she wants this to be done in [place]. She apparently has a telehealth visit with that provider sometime in late [place] 6/24. I have told her I think this is probably too long. Nevertheless I could not convince her to allow a local doctor to perform BKA. 09/19/2018; the patient has a large necrotic area on the right anterior foot. She has had previous transmetatarsal amputations. Culture I did last week showed MRSA nothing else she is on vancomycin at dialysis. She has continued leaking purulent drainage out of the distal part of the large circular wound on the right anterior foot. She apparently went to see Dr. 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 Somehow that translated into her being referred to plastic surgery for debridement of the area. I gather she basically refused amputation although I do not have a copy of Dr. Berenice Primas notes. The patient really wants to have a trial of hyperbaric oxygen. I agreed with initial assessment in this clinic that this was probably too far along to benefit however if she is going to have plastic surgery I think she would benefit from ancillary hyperbaric oxygen. The issue here is that the patient has benefited as maximally as any patient I have ever seen from hyperbaric oxygen therapy. Most recently she had exposed bone on the lateral part of her left foot after a surgical procedure and that actually has closed. She has eschared areas in both heels but no open area. She is remained systemically well. I am not optimistic that anything can be done about this but the patient is very clear that she wants an attempt. The attempt would include a wound VAC further  debridements and hyperbaric oxygen along with IV antibiotics. 6/26; I put her in for a trial of hyperbaric oxygen only because of the dramatic response she has had with wounds on her left midfoot earlier this year which was a surgical wound that went straight to her bone over the metatarsal heads and also remotely the left third toe. We will see if we can get this through our review process and insurance. She arrives in clinic with again purulent material pouring out of necrotic bone on the top of the foot distally. There is also some concerning erythema on the front of the leg that we marked. It is bit difficult to tell how tender this is because of neuropathy. I note from infectious disease that she had her vancomycin extended. 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 She had the wound VAC on for part of the week. The rest of the time she is putting various things on this including Medihoney, "ionized water" silver sorb gel etc. 7/7; follow-up along with HBO. She is still on vancomycin at dialysis. She has a large open area on the dorsal right foot and a small dark eschar area on her heel. There is a lot less erythema in the area and a lot less tenderness. From an infection point of view I think this is better. She still has  a lot of necrosis in the remaining right forefoot [previous TMA] we are still using the wound VAC in this area 7/16; follow-up along with HBO. I put her on linezolid after she finished her vancomycin. We started this last Friday I gave her 2 weeks worth. I had the expectation that she would be operatively debrided by Dr. Marla Roe but that still has not happened yet. Patient phoned the office this week. She arrives for review today after HBO. The distal part of this wound is completely necrotic. Nonviable pieces of tendon bone was still purulent drainage. Also concerning that she has black eschar over the heel that is expanding. I think this may  be indicative of infection in this area as well. She has less erythema and warmth in the ankle and calf but still an abnormal exam 7/21 follow-up along with HBO. I will renew her linezolid after checking a CBC with differential monitoring her blood counts especially her platelets. She was supposed to have surgery yesterday but if I am reading things correctly this was canceled after her blood sugar was found to be over 500. I thought Dr. Marla Roe who called me said that they were sending her to the ER but the patient states that was not the case. 7/28. Follow-up along with HBO. She is on linezolid I still do not have any lab work from dialysis even though I called last week. The patient is concerned about an area on her left lateral foot about the level of the base of her fifth metatarsal. I did not really see anything that ominous here however this patient is in [place] ability to point out problems that she is sensing and she has been accurate in the past Finally she received a call from Dr. Marla Roe who is referring her to another orthopedic surgeon stating that she is too booked up to take her to the operating room now. Was still using a wound VAC on the foot 8/3 -Follow-up after HBO, she is got another week of linezolid, she is to call [facility] for an appointment, x-rays of both feet were reviewed, the left foot x-ray with third MTP joint osteo- Right foot x-ray widespread osteo-in the right midfoot Right ankle x-ray does not show any active evidence of infection 8/11-Patient is seen after HBO, the wounds on the right foot appear to be about the same, the heel wound had some necrotic base over tendon that was debrided with a curette 8/21; patient is seen after HBO. The patient's wound on her dorsal foot actually looks reasonably good and there is substantial amount of epithelialization however the open area distally still has a lot of necrotic debris partially bone. I cannot really get a good  sense of just how deep this probes under the foot. She has been pressuring me this week to order medical maggots through a company in [place] for her. The problem I have is there is not a defined wound area here. On the positive side there is no purulence. She has been to see infectious disease she is still on Septra DS although I have not had a chance to review their notes 8/28; patient is seen in conjunction with HBO. The wounds on her foot continued to improve including the right dorsal foot substantially the, the distal part of this wound and the area on the right heel. We have been using a wound VAC over this chronically. She is still on trimethoprim as directed by infectious disease 9/4; patient is seen in conjunction  with HBO. Right dorsal foot wound substantially anteriorly is better however she continues to have a deep wound in the distal part of this that is not responding. We have been using silver collagen under border foam ooArea on the right plantar medial heel seems better. We have been using Hydrofera Blue 12/12/18 on evaluation today patient appears to be doing about the same with regard to her wound based on prior measurements. She does have some necrotic tissue noted on the lateral aspect of the wound that is going require a little bit of sharp debridement today. This includes what appears to be potentially either severely necrotic bone or tendon. Nonetheless other than that she does not appear to have any severe infection which is good news 9/18; it is been 2 weeks since I saw this wound. She is tolerating HBO well. Continued dramatic improvement in the area on the right dorsal foot. She still has a small wound on the heel that we have been using Hydrofera Blue. She continues with a wound VAC 9/24; patient has to be seen emergently today with a swelling on her right lateral lower leg. She says that she told Dr. Evette Doffing about this and also myself on a couple of occasions but I  really have no recollection of this. She is not systemically unwell and her wound really looked good the last time I saw this. She showed this to providers at dialysis and she was able to verify that she was started on cephalexin today for 5 doses at dialysis. She dialyzes on Tuesday Thursday and Saturday. 10/2; patient is seen in conjunction with HBO. The area that is draining on the right anterior medial tibia is more extensive. Copious amounts of serosanguineous drainage with some purulence. We are still using the wound VAC on the original wound then it is stable. Culture I did of the original IandD showed MRSA I contacted dialysis she is now on vancomycin with dialysis treatments. I asked them to run a month 10/9; patient seen in conjunction with HBO. She had a new spontaneous open area just above the wound on the right medial tibia ankle. More swelling on the right medial tibia. Her wound on the foot looks about the same perhaps slightly better. There is no warmth spreading up her leg but no obvious erythema. her MRI of the foot and ankle and distal tib-fib is not booked for next Friday I discussed this with her in great detail over multiple days. it is likely she has spreading infection upper leg at least involving the distal 25% above the ankle. She knows that if I refer her to orthopedics for infectious disease they are going to recommend amputation and indeed I am not against this myself. We had a good trial at trying to heal the foot which is what she wanted along with antibiotics debridement and HBO however she clearly has spreading infection [probably staph aureus/MRSA]. Nevertheless she once again tells me she wants to wait the left of the MRI. She still makes comments about having her amputation done in [place]. 10/19; arrives today with significant swelling on the lateral right leg. Last culture I did showed Klebsiella. Multidrug-resistant. Cipro was intermediate sensitivity and  that is what I have her on pending her MRI which apparently is going to be done on Thursday this week although this seems to be moving back and forth. She is not systemically unwell. We are using silver alginate on her major wound area on the right medial foot and the  draining areas on the right lateral lower leg 10/26; MRI showed extensive abscess in the anterior compartment of the right leg also widespread osteomyelitis involving osseous structures of the midfoot and portions of the hindfoot. Also suspicion for osteomyelitis anterior aspect of the distal medial malleolus. Culture I did of the purulence once again showed a multidrug-resistant Klebsiella. I have been in contact with nephrology late last week and she has been started on cefepime at dialysis to replace the vancomycin We sent a copy of her MRI report to Dr. Geroge Baseman in [place] who is an orthopedic surgeon. The patient takes great stock in his opinion on this. She says she will go to [place] to have her leg amputated if Dr. Geroge Baseman does not feel there is any salvage options. 11/2; she still is not talk to her orthopedic surgeon in [place]. Apparently he will call her at [phone] this afternoon. The quality of this is she has not allowed me to refer her anywhere. She has been told over and over that she needs this amputated but has not agreed to be referred. She tells me her blood sugar was 600 last night but she has not been febrile. 11/9; she never did got a call from the orthopedic surgeon in [place] therefore that is off the radar. We have arranged to get her see orthopedic surgery at [facility]. She still has a lot of draining purulence coming out of the new abscess in her right leg although that probably came from the osteomyelitis in her right foot and heel. Meanwhile the original wound on the right foot looks very healthy. Continued improvement. The issue is that the last MRI showed osteomyelitis in her right foot extensively  she now has an abscess in the right anterior lower leg. There is nobody in [place] who will offer this woman anything but an amputation and to be honest that is probably what she needs. I think she still wants to talk about limb salvage although at this point I just do not see that. She has completed her vancomycin at dialysis which was for the original staph aureus she is still on cefepime for the more recent Klebsiella. She has had a long course of both of these antibiotics which should have benefited the osteomyelitis on the right foot as well as the abscess. 11/16; apparently [place] elective surgery is shut down because of COVID-19 pandemic. I have reached out to some contacts at [facility] to see if we can get her an orthopedic appointment there. I am concerned about continually leaving this but for the moment everything is static. In fact her original large wound on this foot is closing down. It is the abscess on the right anterior leg that continues to drain purulent serosanguineous material. She is not currently on any antibiotics however she had a prolonged course of vancomycin [1 month] as well as cefepime for a month 02/24/2019 on evaluation today patient appears to be doing better than the last time I saw her. This is not a patient that I typically see. With that being said I am covering for Dr. Dellia Nims this week and again compared to when I last saw her overall the wounds in particular seem to be doing significantly better which is good news. With that being said the patient tells me several disconcerting things. She has not been able to get in to see anyone for potential debridement in regard to her leg wounds although she tells me that she does not think it is necessary any  longer because she is taking care of that herself. She noticed a string coming out of the lower wound on her leg over the last week. The patient states that she subsequently decided that we must of pack something  in there and started pulling the string out and as it kept coming and coming she realized this was likely her tendon. With that being said she continued to remove as much of this as she could. She then I subsequently proceeded to using tubes of antibiotic ointment which she will stick down into the wound and then scored as much as she can until she sees it coming out of the other wound opening. She states that in doing this she is actually made things better and there is less redness and irritation. With regard to her foot wound she does have some necrotic tendon and tissue noted in one small corner but again the actual wound itself seems to be doing better with good granulation in general compared to my last evaluation. 12/7; continued improvement in the wound on the substantial part of the right medial foot. Still a necrotic area inferiorly that required debridement but the rest of this looks very healthy and is contracting. She has 2 wounds on the right lateral leg which were her original drainage sites from her abscess but all of this looks a lot better as well. She has been using silver alginate after putting antibiotic biotic ointment in one wound and watching it come out the other. I have talked to her in some detail today. I had given her names of orthopedic surgeons at [facility] for second opinion on what to do about the right leg. I do not think the patient never called them. She has not been able to get a hold of the orthopedic surgeon in [place] that she had put a lot of faith in as being somebody would give her an opinion that she would trust. I talked to her today and said even if I could get her in to another orthopedic surgeon about the leg which she accept an amputation and she said she would not therefore I am not going to press this issue for the moment 12/14; continued improvement in his substantial wound on the right medial foot. There is still a necrotic area inferiorly with  tightly adherent necrotic debris which I have been working on debriding each time she is here. She does not have an orthopedic appointment. Since last time she was here I looked over her cultures which were essentially MRSA on the foot wound and gram-negative rods in the abscess on the anterior leg. 12/21; continued improvement in the area on the right medial foot. She is not up on this much and that is probably a good thing since I do not know it could support continuous ambulation. She has a small area on the right lateral leg which were remanence of the IandD's I did because of the abscess. I think she should probably have prophylactic antibiotics I am going to have to look this over to see if we can make an intelligent decision here. In the meantime her major wound is come down nicely. Necrotic area inferiorly is still there but looks a lot better 04/06/2019; she has had some improvement in the overall surface area on the right medial foot somewhat narrowedr both but somewhat longer. The areas on the right lateral leg which were initial IandD sites are superficial. Nothing is present on the right heel. We are using  silver alginate to the wound areas 1/18; right medial foot somewhat smaller. Still a deep probing area in the most distal recess of the wound. She has nothing open on the right leg. She has a new wound on the plantar aspect of her left fourth toe which may have come from just pulling skin. The patient using Medihoney on the wound on her foot under silver alginate. I cannot discourage her from this 2/1; 2-week follow-up using silver alginate on the right foot and her left fourth toe. The area on the right dorsal foot is contracted although there is still the deep area in the most distal part of the wound but still has some probing depth. No overt infection 2/15; 2-week follow-up. She continues to have improvement in the surface area on the dorsal right foot. Even the tunneling area from  last time is almost closed. The area that was on the plantar part of her left fourth toe over the PIP is indeed closed 3/1; 2-week follow-up. Continued improvement in surface area. The original divot that we have been debriding inferiorly I think has full epithelialization although the epithelialization is gone down into the wound with probably 4 mm of depth. Even under intense illumination I am unable to see anything open here. The remanence of the wound in this area actually look quite healthy. We have been using silver alginate 3/15; 2-week follow-up. Unfortunately not as good today. She has a comma shaped wound on the dorsal foot however the upper part of this is larger. Under illumination debris on the surface She also tells [place] that she was on her right leg 2 times in the last couple of weeks mostly to reach up for things above her head etc. She felt a sharp pain in the right leg which she thinks is somewhere from the ankle to the knee. The patient has neuropathy and is really uncertain. She cannot feel her foot so she does not think it was coming from there 3/29; 2-week follow-up. Her wound measures smaller. Surface of the wound appears reasonable. She is using silver alginate with underlying Medihoney. She has home health. X-rays I did of her tib-fib last time were negative although it did show arterial calcification 4/12; 2-week follow-up. Her wound measures smaller in length. Using manuka honey with silver alginate on top. She has home health. 4/26; 2-week follow-up. Her wound is smaller but still very adherent debris under illumination requiring debridement she has been using manuka honey with silver alginate. 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 Been using Hydrofera Blue. She is on suppressive Bactrim but apparently she has been taking it twice daily 6/7; I have not seen this  wound and about 6 weeks. Since then she was up in [place]. By her own admission she was walking on the foot because she did not have a wheelchair. The wound is not nearly as healthy looking as it was the last time I saw this. We ordered different things for her but she only uses Medihoney and silver alginate. As far as I know she is on suppressive trimethoprim sulfamethoxazole. She does not admit to any fever or chills. Her CBGs apparently are at baseline however she is saying that she feels some discomfort on the lateral part of her ankle I looked over her last inflammatory markers from the summer 2020 at which time she had a deeply  necrotic infected wound in this area. On 11/10/2018 her sedimentation rate was 56 and C-reactive protein 9.9. This was 107 and 29 on 07/29/2018. 6/17; the patient had a necrotic wound the last time she was here on the right dorsal foot. After debridement I did a culture. This showed a very resistant ESBL Klebsiella as well as Enterococcus. Her x-ray of the foot which was done because of warmth and some discomfort showed bone destruction within the carpal bones involving the navicular acute cuboid lateral middle cuneiforms but essentially unchanged from her prior study which was done on 10/29/2018. The findings were felt to represent chronic osteomyelitis. We did inflammatory markers on her. Her white count was 5.25 sedimentation rate 16 and C-reactive protein at 11.1. Notable for the fact that in August 2020 her CRP was 9.9 and sedimentation rate 56. I have looked at her x-rays. It is true that the bone destruction is very impressive however the patient came into this clinic for the wound on her right foot with pieces of bone literally falling out anteriorly with purulent material. I am not exactly sure I could have expected anything different. She has not been systemically unwell no fever chills or blood sugars have been reasonable. 6/28; she arrives with a right heel  closed. The substantial area on the right anterior foot looks healthy. Much better looking surface. I think we can change to [facility] seems to help this previously. She is getting her antibiotics at dialysis she should be just about finished 7/9; changed to [facility] last week. Surface wound looks satisfactory not much change in surface area however. She is going to [place] state next week this is usually a difficult thing for this patient follow-up will be for 2 weeks. 7/23; using Hydrofera Blue. She returns from her trip and the wound looks surprisingly good. Usually when this patient goes on trips she comes back with a lot of problems with the wound. She is saying that she sometimes feels an episodic "crunching" feeling on the lateral part of the foot. She is neuropathic and not feeling pain but wonders whether this could be a neuropathic dysesthesia. 11/13/19-Patient returns after 3 weeks, the wound itself is stable and patient states that there is nothing new going on she is on some extra anxiety medications and is resisting the temptation to pick at the dry skin around the wound. 9/20; patient has not been here in over a month and I have not seen her in 2 months. The wound in terms of size I think is about the same. There is no exposed bone. She has a nonviable surface on this. She is supposed to be using [facility] however she is also been using some form of honey preparation as well as a silver-based dressings. I do not think she has any pattern to this. 10/4; 2-week follow-up. Patient has been using some form of spray which she says has honey and silver to purchase this online she has been covering it with gauze. In spite of this the wound actually looks quite good. The deeper divot distally appears to be close down. There is a rim of epithelialization. 10/18; 2-week follow-up. Patient has been using her Hydrofera Blue covered with her silver honey spray that she got  online. 11/1; 2-week follow-up. She is using Hydrofera Blue with a silver honey spray. Wound bed is measuring smaller. She has noticed that her foot is warmer on the right. She is concerned about infection. For a long period of  time I had her on prophylactic trimethoprim sulfamethoxazole DS 1 tablet daily. She is asking for this to be restarted. The patient is walking on this foot because of repairs that are being done in a home her but her room is on the second floor she has to go up and down stairs. I have cautioned against this however as usual she will do exactly what she wants to do 11/15; 2-week follow-up. She uses Hydrofera Blue with a silver/honey spray which I have never heard of. I think her wound looks about the same. Some epithelialization. No evidence that this is infected. I think she is walking on this more than we agreed on. She is going on extensive vacation over Thanksgiving Objective Constitutional Patient is hypertensive.. Pulse regular and within target range for patient.Marland Kitchen Respirations regular, non-labored and within target range.. Patient is febrile today.Marland Kitchen Appears in no distress. Vitals Time Taken: 1:01 PM, Height: 67 in, Source: Stated, Weight: 125 lbs, Source: Stated, BMI: 19.6, Temperature: 99 F, Pulse: 85 bpm, Respiratory Rate: 18 breaths/min, Blood Pressure: 180/93 mmHg. General Notes: glucose >400 at present per pt's meter Respiratory work of breathing is normal. Cardiovascular Salas pedis pulses are palpable. General Notes: Wound exam; the wound is about the same. Healthy looking granulation rims of epithelialization. There is no evidence of surrounding infection Integumentary (Hair, Skin) Wound #43 status is Open. Original cause of wound was Gradually Appeared. The wound is located on the Right,Medial Foot. The wound measures 3cm length x 3.8cm width x 0.1cm depth; 8.954cm^2 area and 0.895cm^3 volume. There is Fat Layer (Subcutaneous Tissue) exposed. There is no  tunneling or undermining noted. There is a medium amount of serosanguineous drainage noted. The wound margin is flat and intact. There is medium (34-66%) pink granulation within the wound bed. There is a medium (34-66%) amount of necrotic tissue within the wound bed including Adherent Slough. 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 Dressing Change Frequency: Wound #43 Right,Medial Foot: Change Dressing every other day. Wound Cleansing: Wound #43 Right,Medial Foot: Clean wound with Wound Cleanser - or normal saline Primary Wound Dressing: Wound #43 Right,Medial Foot: Hydrofera Blue Secondary Dressing: Wound #43 Right,Medial Foot: Kerlix/Rolled Gauze Dry Gauze ABD pad Other: - ACE wrap Edema Control: Elevate legs to the level of the heart or above for 30 minutes daily and/or when sitting, a frequency of: - throughout the day Home Health: [facility] for wound care. - Interim 1. I am continue with the [facility] and the same spray she is putting on this 2. I am somewhat concerned about the warmth in her foot vis--vis the temperature in her other foot. I have no doubt she has chronic osteomyelitis here however she is not going to agree to an amputation unless it is a life-threatening situation. We have been over and over this 3. It was never thought that this foot on the right would be an ambulatory foot although I think she is using it more than she should. 4. We will see her back after extensive Electronic Signature(s) Signed: 02/15/2020 5:39:18 PM By: Linton Ham MD Entered By: Linton Ham on 02/15/2020  13:57:25 -------------------------------------------------------------------------------- SuperBill Details Patient Name: Date of Service: GA LLO WA Y, [name]  02/15/2020 Medical Record Number: [id number] Patient Account Number: [id number] Date of Birth/Sex: Treating RN: Mar 11, 1971 (49 y.o. [name] Primary Care Provider: [name], [name] Other Clinician: Referring Provider: Treating Provider/Extender: [name], [name] Weeks in Treatment: 51 Diagnosis Coding ICD-10 Codes Code Description [phone] Other chronic osteomyelitis, right ankle and foot L97.514 Non-pressure chronic ulcer of other part of right foot with necrosis of bone L97.411 Non-pressure chronic ulcer of right heel and midfoot limited to breakdown of skin E10.621 Type 1 diabetes mellitus with foot ulcer Facility Procedures CPT4 Code: [id number] Description: 99213 - WOUND CARE VISIT-LEV 3 EST PT Modifier: Quantity: 1 Physician Procedures : CPT4 Code Description Modifier [id number] - WC PHYS LEVEL 3 - EST PT ICD-10 Diagnosis Description L97.514 Non-pressure chronic ulcer of other part of right foot with necrosis of bone M86.671 Other chronic osteomyelitis, right ankle and foot Quantity: 1 Electronic Signature(s) Signed: 02/15/2020 5:39:18 PM By: Linton Ham MD Entered By: Linton Ham on 02/15/2020 13:57:46

## 2020-02-17 ENCOUNTER — Ambulatory Visit (INDEPENDENT_AMBULATORY_CARE_PROVIDER_SITE_OTHER): Payer: Medicare HMO | Admitting: Endocrinology

## 2020-02-17 ENCOUNTER — Other Ambulatory Visit: Payer: Self-pay

## 2020-02-17 ENCOUNTER — Telehealth: Payer: Self-pay | Admitting: Endocrinology

## 2020-02-17 VITALS — BP 122/80 | HR 92 | Ht 67.0 in | Wt 128.4 lb

## 2020-02-17 DIAGNOSIS — E1051 Type 1 diabetes mellitus with diabetic peripheral angiopathy without gangrene: Secondary | ICD-10-CM

## 2020-02-17 LAB — POCT GLYCOSYLATED HEMOGLOBIN (HGB A1C): Hemoglobin A1C: 9.4 % — AB (ref 4.0–5.6)

## 2020-02-17 LAB — GLUCOSE, POCT (MANUAL RESULT ENTRY): POC Glucose: HIGH mg/dl (ref 70–99)

## 2020-02-17 NOTE — Telephone Encounter (Signed)
Patient called, needs insuling refill ASAP - per patient has no insulin to put in her pump this morning and she is out. She is aware of her appointment at 1 PM but states she CAN NOT WAIT.  [facility] on [address] - insulin aspart (NOVOLOG) 100 UNIT/ML

## 2020-02-17 NOTE — Progress Notes (Signed)
Patient ID: [name], female   DOB: [birth year], 49 y.o.   MRN: [id number]   Reason for Appointment: follow-up of diabetes  History of Present Illness   Diagnosis: Type 1 DIABETES MELITUS  Previous history: She has had long-standing poorly controlled diabetes and typically has poor compliance with self care measures despite periodic diabetes education and periodic followup  INSULIN PUMP regimen:   Current insulin pump: T-insulin as of 07/20/2019   Basal rates: 0.3 until 3 PM and then 0.35   all 24 hours  Previous settings: Midnight to 6 AM = 0.6, 3 AM = 0.5, 6 AM-9 AM: 0.55, 9 AM-1 PM = 0. 3, 1 PM-3 PM = 0.3, 3 PM- 7 PM = 0.75, 7 PM-12 AM = 0.85   Carb coverage 1: 20 g and correction 1: 70 Active insulin 4 hours CGM alerts: 70, 300         Recent history:    Current blood sugar patterns, difficulties with management and problems identified:  A1c is 9.4 compared to 8.4   She has overall higher blood sugars  This may be related to inadequate boluses again as well as not changing her infusion site on time  She is only changing her infusion set when her insulin runs out and this is only about once a week on average  Blood sugars have been over 400 for about 24 hours now, she has not taken any extra insulin with injections  She claims that her insulin pump does not give her a quick delivery of insulin but is usually not injecting more than 6 units for correction boluses  Also sometimes do any correction boluses even when blood sugars are persistently high  She has missed mealtime insulin boluses frequently  Only occasionally will have near normal readings overnight when she is not eating  Hypoglycemia has been minimal with no sugars only 1 morning around 7 AM  CGM interpretation as follows:  High degree of variability present with best blood sugars around 10-11 AM and highest blood sugars 2-4 PM on an average  Blood sugars are above target all the  time except between about 9 AM-11 AM when they are about 150-170  Otherwise blood sugars averaging in different segments of the day as below  Since boluses are not being entered for all meals difficult to know whether the hyperglycemia is occurring from food but tends to have high blood sugars including some times persistently high readings for several hours around 400+ at different times   overnight blood sugars are highly variable but occasionally fairly good and stable  CGM use % of time 93  2-week average/SD 255  Time in range       30%  % Time Above 180  30  % Time above 250  39  % Time Below 70 1      PRE-MEAL  overnight  morning  afternoon  evening Overall  Glucose range:      50-400  Mean/median:  256  184, was 176  304, was 272  278, was 209  255     Previous data:  CGM use % of time   Average and SD  217+/-99  Time in range      40%  % Time Above 180  58  % Time above 250   % Time Below target 2    PRE-MEAL  overnight  morning  afternoon  evening Overall  Glucose range:      58-400  Mean/median:  199  176  272  209  217+/-99    Glycemic patterns summary: Blood sugars are showing marked fluctuation especially in the afternoons is also around 2 AM blood sugars are above the target throughout the day except between about 8 AM-10 AM Overall HIGHEST blood sugars are between 12 PM and 5 PM No significant hypoglycemia  Hyperglycemic episodes are occurring mostly in the afternoons but also sporadically at all other times including overnight  Hypoglycemic episodes have been minimal with mostly low normal readings as above  Overnight periods: Blood sugars are extremely variable and averaging mostly between 180-210 without significant hypoglycemia  Preprandial periods: She has no psych mealtimes and bolus information is frequently missing  Postprandial periods:   Very few bolus events documented for meals, last evening blood sugar did go up significantly higher  after the bolus but previously did come back to normal with high readings   Previous data:  PRE-MEAL  overnight  morning  afternoon  evening Overall  Glucose range:      82-400  Mean/median:  279  158  217  248  238+/-97      Meals: eating at variable times and also having snacks periodically.  Her daily carbohydrate intake is quite variable Physical activity: exercise: none         Dietician visit: Most recent:?          Complications: are: Neuropathy, nephropathy, diabetic foot ulcer, amputation    Wt Readings from Last 3 Encounters:  02/17/20 128 lb 6.4 oz (58.2 kg)  01/20/20 125 lb 10.6 oz (57 kg)  01/15/20 125 lb 10.6 oz (57 kg)   Lab Results  Component Value Date   HGBA1C 9.4 (A) 02/17/2020   HGBA1C 8.4 10/22/2019   HGBA1C 11.7 (A) 07/21/2019   Lab Results  Component Value Date   MICROALBUR 304.2 (H) 11/11/2017   LDLCALC 78 05/02/2016   CREATININE 4.00 (H) 01/20/2020   Office Visit on 02/17/2020  Component Date Value Ref Range Status  . Hemoglobin A1C 02/17/2020 9.4* 4.0 - 5.6 % Final  . POC Glucose 02/17/2020 HIGH  70 - 99 mg/dl Final    Other problems addressed:: See review of systems   Allergies as of 02/17/2020      Reactions   Cleocin [clindamycin Hcl] Diarrhea   Lisinopril Other (See Comments)   Elevated potassium per pt report   Amoxicillin Diarrhea, Nausea Only, Other (See Comments)   Did it involve swelling of the face/tongue/throat, SOB, or low BP? No Did it involve sudden or severe rash/hives, skin peeling, or any reaction on the inside of your mouth or nose? No Did you need to seek medical attention at a hospital or doctor's office? No When did it last happen?10 + years If all above answers are "NO", may proceed with cephalosporin use.   Bactrim [sulfamethoxazole-trimethoprim] Diarrhea, Nausea Only      Medication List       Accurate as of February 17, 2020  2:38 PM. If you have any questions, ask your nurse or doctor.          albuterol 108 (90 Base) MCG/ACT inhaler Commonly known as: VENTOLIN HFA Inhale 2 puffs into the lungs every 6 (six) hours as needed for wheezing or shortness of breath.   aspirin EC 81 MG tablet Take 81 mg by mouth daily.   B-12 5000 MCG Caps Take 5,000 mcg by mouth daily.   calcitRIOL 0.5 MCG capsule Commonly known as: ROCALTROL Take 1 capsule  daily   calcium acetate 667 MG capsule Commonly known as: PHOSLO Take 2 capsules (1,334 mg total) by mouth 3 (three) times daily with meals.   Contour Next Test test strip Generic drug: glucose blood USE AS INSTRUCTED TO CHECK BLOOD SUGAR 4 TIMES A DAY. DX E10.65   Dexcom G6 Receiver Devi 1 each by Does not apply route See admin instructions. Use Dexcom Receiver to monitor blood sugar continuously.   Dexcom G6 Sensor Misc 1 each by Does not apply route See admin instructions. Use one sensors once every 10 days to monitor blood sugars.   Dexcom G6 Transmitter Misc 1 each by Does not apply route See admin instructions. Use one transmitter once every 90 days.   DIALYVITE 800 WITH ZINC 0.8 MG Tabs Take 1 tablet by mouth daily.   diphenhydrAMINE 25 MG tablet Commonly known as: BENADRYL Take 25 mg by mouth daily as needed.   folic acid 017 MCG tablet Commonly known as: FOLVITE Take 400 mcg by mouth daily.   insulin aspart 100 UNIT/ML injection Commonly known as: NovoLOG USE A MAX OF 65 UNITS DAILY VIA INSULIN PUMP.   insulin pump Soln Inject into the skin. Insulin aspart (Novolog) 100 unit/ml---MAX 65 UNITS DAILY   norethindrone 5 MG tablet Commonly known as: AYGESTIN Take 5 mg by mouth See admin instructions.   Omega 3 500 500 MG Caps Take 500 mg by mouth daily.   ondansetron 4 MG tablet Commonly known as: ZOFRAN Take 4 mg by mouth every 8 (eight) hours as needed for nausea or vomiting.   pravastatin 40 MG tablet Commonly known as: PRAVACHOL TAKE 1 TABLET BY MOUTH EVERY DAY   sulfamethoxazole-trimethoprim 800-160 MG  tablet Commonly known as: BACTRIM DS Take 1 tablet by mouth daily.       Allergies:  Allergies  Allergen Reactions  . Cleocin [Clindamycin Hcl] Diarrhea  . Lisinopril Other (See Comments)    Elevated potassium per pt report  . Amoxicillin Diarrhea, Nausea Only and Other (See Comments)    Did it involve swelling of the face/tongue/throat, SOB, or low BP? No Did it involve sudden or severe rash/hives, skin peeling, or any reaction on the inside of your mouth or nose? No Did you need to seek medical attention at a hospital or doctor's office? No When did it last happen?10 + years If all above answers are "NO", may proceed with cephalosporin use.    . Bactrim [Sulfamethoxazole-Trimethoprim] Diarrhea and Nausea Only    Past Medical History:  Diagnosis Date  . Anemia   . Arthritis   . Bronchitis   . C. difficile diarrhea 09/26/2014  . Cataracts, both eyes    had surgery to remove  . Cellulitis of right foot 06/02/2014  . CKD (chronic kidney disease) stage 3, GFR 30-59 ml/min (HCC)    Dialysis T, TH, Sat  . Diabetic ulcer of right foot ([place]) 06/02/2014  . DVT (deep venous thrombosis) ([place]) 10/2014   one in each one in leg- PICC line , one in right and left arm  . H/O seasonal allergies   . Heart murmur    told once when she was pregnant- no furter mention- was in notes 11/2015- had Echo 8 /4/17  . History of blood transfusion   . Hypercholesteremia   . Hypertension   . Left foot infection   . Neuromuscular disorder ([place])    lower legs and feet  . Neuropathy    feet  . Neuropathy in diabetes ([place])   .  Peripheral vascular disease ([name])   . Sleep apnea    does not use cpap  . Staphylococcus aureus bacteremia 10/2014  . Type 1 diabetes ([place])    onset age 70    Past Surgical History:  Procedure Laterality Date  . AMPUTATION TOE Left 07/24/2015   5th toe and Hallux amputation  . AV FISTULA PLACEMENT Left 08/06/2018   Procedure: INSERTION OF BASCILIC VEN TRANSPOSITION  1ST STAGE;  Surgeon: Rosetta Posner, MD;  Location: [facility];  Service: Vascular;  Laterality: Left;  . BASCILIC VEIN TRANSPOSITION Left 04/29/2019   Procedure: SECOND STAGE BASCILIC VEIN TRANSPOSITION LEFT ARM;  Surgeon: Rosetta Posner, MD;  Location: [facility];  Service: Vascular;  Laterality: Left;  . CESAREAN SECTION     x 1  . EXOSTECTECTOMY TOE Left 04/11/2016   Procedure: EXOSTECTECTOMY CUBOID AND 5TH METATARSAL AND PLACEMENT OF ANTIBIOTIC BEADS;  Surgeon: Edrick Kins, DPM;  Location: [facility];  Service: Podiatry;  Laterality: Left;  . EYE SURGERY Bilateral    removed cataracts - laser  . FEMORAL-POPLITEAL BYPASS GRAFT Left 05/03/2015  . FOOT AMPUTATION THROUGH METATARSAL Right 2016  . hemrrhoidectomy    . I & D EXTREMITY Right 06/10/2014   Procedure: IRRIGATION AND DEBRIDEMENT Right Foot;  Surgeon: Newt Minion, MD;  Location: [facility];  Service: Orthopedics;  Laterality: Right;  . I & D EXTREMITY Right 07/31/2018   Procedure: IRRIGATION AND DEBRIDEMENT EXTREMITY;  Surgeon: Edrick Kins, DPM;  Location: [facility];  Service: Podiatry;  Laterality: Right;  . I & D EXTREMITY Right 08/04/2018   Procedure: IRRIGATION AND DEBRIDEMENT FOOT with placement of wound vac;  Surgeon: Edrick Kins, DPM;  Location: [facility];  Service: Podiatry;  Laterality: Right;  . INCISION AND DRAINAGE Left 04/11/2016   Procedure: INCISION AND DRAINAGE A DEEP COMPLICATED WOUND OF LEFT FOOT;  Surgeon: Edrick Kins, DPM;  Location: [facility];  Service: Podiatry;  Laterality: Left;  . INSERTION OF DIALYSIS CATHETER Right 08/06/2018   Procedure: INSERTION OF TUNNELED DIALYSIS CATHETER;  Surgeon: Rosetta Posner, MD;  Location: [facility];  Service: Vascular;  Laterality: Right;  . IR FLUORO GUIDE CV LINE RIGHT  09/24/2017  . IR FLUORO GUIDE CV LINE RIGHT  08/01/2018  . IR REMOVAL TUN CV CATH W/O FL  11/22/2017  . IR US GUIDE VASC ACCESS RIGHT  09/24/2017  . IR US GUIDE VASC ACCESS RIGHT  08/01/2018  . IRRIGATION AND DEBRIDEMENT ABSCESS Left 05/06/2017    Procedure: IRRIGATION AND DEBRIDEMENT ABSCESS LEFT FOOT;  Surgeon: Edrick Kins, DPM;  Location: [facility];  Service: Podiatry;  Laterality: Left;  . PERIPHERAL VASCULAR BALLOON ANGIOPLASTY Left 01/20/2020   Procedure: PERIPHERAL VASCULAR BALLOON ANGIOPLASTY;  Surgeon: Marty Heck, MD;  Location: [facility];  Service: Cardiovascular;  Laterality: Left;  Arm fistula  . PERIPHERALLY INSERTED CENTRAL CATHETER INSERTION    . SKIN GRAFT Right 06/15/2014  . SKIN GRAFT Left 06/2015   foot   . SKIN SPLIT GRAFT Right 06/15/2014   Procedure: SPLIT THICKNESS SKIN GRAFT RIGHT FOOT;  Surgeon: Newt Minion, MD;  Location: [facility];  Service: Orthopedics;  Laterality: Right;  . TRANSMETATARSAL AMPUTATION Right   . TRANSMETATARSAL AMPUTATION Right 09/11/2014  . WISDOM TOOTH EXTRACTION      Family History  Problem Relation Age of Onset  . Hyperlipidemia Mother   . Dementia Mother     Social History:  reports that she quit smoking about 11 years  ago. She quit after 15.00 years of use. She has never used smokeless tobacco. She reports previous alcohol use. She reports that she does not use drugs.  Review of Systems:   Hypertension/CKD:   Is on dialysis  She is supposed to be taking blood pressure medication but she says she is not  BP Readings from Last 3 Encounters:  02/17/20 122/80  01/20/20 (!) 199/113  01/15/20 (!) 186/89    Lipids: Has been  treated with pravastatin 40 mg  Followed by PCP   Lab Results  Component Value Date   CHOL 152 06/12/2018   HDL 24.40 (L) 06/12/2018   LDLCALC 78 05/02/2016   LDLDIRECT 82.0 06/12/2018   TRIG 222.0 (H) 06/12/2018   CHOLHDL 6 06/12/2018      Chronic anemia, followed by nephrologist and PCP:  Lab Results  Component Value Date   WBC 6.1 11/10/2018   HGB 13.9 01/20/2020   HCT 41.0 01/20/2020   MCV 73.9 (L) 11/10/2018   PLT 370 11/10/2018    She has a Charcot foot, 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      ASSESSMENT/ PLAN:   Diabetes type 1 with poor control and multiple complications      See history of present illness for detailed discussion of current diabetes management, blood sugar patterns and problems identified  A1c is 9.4  She has been using closed-loop insulin pump with the T-Slim device and Dexcom  CGM interpretation done as above and blood sugars analyzed She has hyperglycemia related to missed boluses, likely sometimes inadequate boluses and also likely frequent infusion set malfunction due to not changing every 3 to 4 days Also has inherent variability, lack of motivation to do correction boluses when needed  Hypoglycemia has been minimal  PLAN:     She will discuss technical issues with the pump with the nurse educator and turn on the quick bolus modality  She will take injections of insulin if blood sugars are not coming down with the pump boluses  Change infusion set twice a week consistently  Also she will change infusion set blood sugars are all over 400 for 4 hours at a stretch despite boluses  Avoid high carbohydrate meals without adequate boluses and sugar drinks  Needs to do correction boluses for high readings if blood sugars are staying persistently over 250  8 units of NovoLog was given subcutaneously for hyperglycemia in the office  Patient Instructions  Take manual correction boluses for high readings over 250  If sugar > 400 for over 4 hours with boluses change the set  Turn on quick bolus       Chalonda Schlatter 02/17/2020, 2:38 PM      Elayne Snare 02/17/20

## 2020-02-17 NOTE — Patient Instructions (Signed)
Take manual correction boluses for high readings over 250  If sugar > 400 for over 4 hours with boluses change the set  Turn on quick bolus

## 2020-02-24 ENCOUNTER — Encounter (HOSPITAL_COMMUNITY): Payer: Medicare HMO

## 2020-03-04 ENCOUNTER — Other Ambulatory Visit: Payer: Self-pay

## 2020-03-04 ENCOUNTER — Encounter (HOSPITAL_BASED_OUTPATIENT_CLINIC_OR_DEPARTMENT_OTHER): Payer: Medicare HMO | Attending: Internal Medicine | Admitting: Internal Medicine

## 2020-03-04 DIAGNOSIS — E1042 Type 1 diabetes mellitus with diabetic polyneuropathy: Secondary | ICD-10-CM | POA: Diagnosis not present

## 2020-03-04 DIAGNOSIS — Z86718 Personal history of other venous thrombosis and embolism: Secondary | ICD-10-CM | POA: Insufficient documentation

## 2020-03-04 DIAGNOSIS — E1069 Type 1 diabetes mellitus with other specified complication: Secondary | ICD-10-CM | POA: Diagnosis not present

## 2020-03-04 DIAGNOSIS — E10621 Type 1 diabetes mellitus with foot ulcer: Secondary | ICD-10-CM | POA: Diagnosis not present

## 2020-03-04 DIAGNOSIS — Z9641 Presence of insulin pump (external) (internal): Secondary | ICD-10-CM | POA: Insufficient documentation

## 2020-03-04 DIAGNOSIS — L97411 Non-pressure chronic ulcer of right heel and midfoot limited to breakdown of skin: Secondary | ICD-10-CM | POA: Insufficient documentation

## 2020-03-04 DIAGNOSIS — M86671 Other chronic osteomyelitis, right ankle and foot: Secondary | ICD-10-CM | POA: Insufficient documentation

## 2020-03-04 DIAGNOSIS — Z87891 Personal history of nicotine dependence: Secondary | ICD-10-CM | POA: Diagnosis not present

## 2020-03-04 DIAGNOSIS — L97512 Non-pressure chronic ulcer of other part of right foot with fat layer exposed: Secondary | ICD-10-CM | POA: Diagnosis not present

## 2020-03-04 DIAGNOSIS — Z8619 Personal history of other infectious and parasitic diseases: Secondary | ICD-10-CM | POA: Diagnosis not present

## 2020-03-04 NOTE — Progress Notes (Signed)
CELITA, ARON ([id number]) Visit Report for 03/04/2020 HPI Details Patient Name: Date of Service: [name] 03/04/2020 2:30 PM Medical Record Number: [id number] Patient Account Number: [id number] Date of Birth/Sex: Treating RN: 1970/05/05 (49 y.o. [name] Primary Care Provider: [name], [name] Other Clinician: Referring Provider: Treating Provider/Extender: [name], [name] Weeks in Treatment: 26 History of Present Illness HPI Description: 49 year old diabetic who is known to have type 1 diabetes which is poorly controlled last hemoglobin A1c was 11%. She comes in with a ulcerated area on the left lateral foot which has been there for over 6 months. Was recently she has been treated by [name] of podiatry who saw her last on 05/28/2016. Review of his notes revealed that the patient had incision and drainage with placement of antibiotic beads to the left foot on 04/11/2016 for possible osteomyelitis of the cuboid bone. Over the last year she's had a history of amputation of the left fifth toe and a femoropopliteal popliteal bypass graft somewhere in April 2017. 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 His note [name] mentions that the patient has been referred to me for further wound care and possibly great candidate for hyperbaric oxygen therapy due to recurrent osteomyelitis. However we do not have any x-rays of biopsy reports confirming this. He has been on several antibiotics including Bactrim and most recently is on doxycycline for an MRSA. I understand, the patient was not a candidate for IV antibiotics as she has had previous PICC lines which resulted in blood clots in both arms. There was a x-ray report dated 04/04/2016 on [name] notes which showed evidence of fifth ray resection left foot with osteolytic changes noted to the fourth metatarsal and cuboid bone on the left. 06/13/2016 -- had a left foot x-ray which  showed no acute fracture or dislocation and no definite radiographic evidence of osteomyelitis. Advanced osteopenia was seen. 06/20/2016 -- she has noticed a new wound on the right plantar foot in the region where she had a callus before. 06/27/16- the patient did have her x-ray of the right foot which showed no findings to suggest osteomyelitis. She saw her endocrinologist, [name], yesterday. Her A1c in January was 11. He also indicates mismanagement and noncompliance regarding her diabetes. She is currently on Bactrim for a lip infection. She is complaining of nausea, vomiting and diarrhea. She is unable to articulate the exact orders or dosing of the Bactrim; it is unclear when she will complete this. 07/04/2016 -- results from [facility] of ABIs with ankle waveforms were noted from 02/14/2016. The examination done on 06/27/2015 showed noncompressible ABIs with the right being 1.45 and the left being 1.33. The present examination showed a right ABI of 1.19 on the left of 1.33. The conclusion was that right normal ABI in the lower extremity at rest however compared to previous study which was noncompressible ABI may be falsely elevated side suggesting medial calcification. The left ABI suggested medial calcification. 08/01/2016 -- the patient had more redness and pain on her right foot and did not get to come to see as noted she see her PCP or go to the ER and decided to take some leftover metronidazole which she had at home. As usual, the patient does report she feels and is rather noncompliant. 08/08/2016 -- -- x-ray of the right foot -- FINDINGS:Transmetatarsal amputation is noted. No bony destruction is noted to suggest osteomyelitis. IMPRESSION: No evidence of  osteomyelitis. Postsurgical changes are seen. MRI would be more sensitive for possible bony changes. Culture has grown Serratia Marcescens -- sensitive to Bactrim, ciprofloxacin, ceftazidime she was seen by [name] on  08/06/2016. He did not find any exposed bone, muscle, tendon, ligament or joint. There was no malodor and he did a excisional debridement in the office. ============ Old notes: 49 year old patient who is known to the wound clinic for a while had been away from the wound clinic since 09/01/2014. Over the last several months she has been admitted to various hospitals including [facility] [place]. She was treated for a right metatarsal osteomyelitis with a transmetatarsal amputation and this was done about 2 months ago. He has a small ulcerated area on the right heel and she continues to have an ulcerated area on the left plantar aspect of the foot. The patient was recently admitted to the [facility] between 7/12 and 10/18/2014. she was given 3 weeks of IV vancomycin and was to follow-up with her surgeons at [facility] and also took oral vancomycin for C. difficile colitis. 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 MRI of the left foot done on 10/14/2014 showed no abscess or osteomyelitis. 04/27/15; this is a patient we know from previous stays in the wound care center. She is a type I diabetic I am not sure of her control currently. Since the last time I saw her she is had a right transmetatarsal amputation and has no wounds on her right foot and has no open wounds. She is been followed at the wound care center at [facility] in [place]. She comes today with the desire to undergo hyperbaric treatment locally. Apparently one of her wound care providers in [place] has suggested hyperbarics. This is in response to an MRI from 04/18/15 that showed increased marrow signal and loss of the proximal  fifth metatarsal cortex evidence of osteomyelitis with likely early osteomyelitis in the cuboid bone as well. She has a large wound over the base of the fifth metatarsal. She also has a eschar over her the tips of her toes on 1,3 and 5. She does not have peripheral pulses and apparently is going for an angiogram tomorrow which seems reasonable. After this she is going to infectious disease at [facility]. They have been using Medihoney to the large wound on the lateral aspect of the left foot to. The patient has known Charcot deformity from diabetic neuropathy. She also has known diabetic PAD. Surprisingly I can't see that she has had any recent antibiotics, the patient states the last antibiotic she had was at the end of November for 10 days. I think this was in response to culture that showed group G strep although I'm not exactly sure where the culture was from. She is also had arterial studies on 03/29/15. This showed a right ABI of 1.4 that was noncompressible. Her left ABI was 0.73. 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 It was not felt that arterial inflow was adequate for healing of a foot ulcer. Her Doppler waveforms looked monophasic ===== READMISSION 02/28/17; this is in an now 49 year old woman we've had at several different occasions in this clinic. 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 She has  a remote ex-smoker. She was last seen in this clinic by Dr. Con Memos I think in May. More recently she is been followed by her podiatrist [name] an infectious disease Dr. Megan Salon. She has 2 open wounds the major one is over the right first metatarsal head she also has a wound on the left plantar foot. an MRI of the right foot on 01/01/17 showed a soft tissue ulcer along the plantar aspect of the first metatarsal base consistent with osteomyelitis of the first metatarsal stump. Dr. 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 According to the patient this is been open for slightly over a month. She has been on a combination of Cipro 500 twice a day, Zyvox 600 twice a day and Flagyl 500 3 times a day for over a month now as directed by Dr. Megan Salon. cultures of the right foot earlier this year showed MRSA in January and Serratia in May. January also had a few viridans strep. Recent x-rays of both feet were done and [name] office and I don't have these reports. The patient has known PAD and has a history of aleft femoropopliteal bypass in April 2017. 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 Dwyane Dee. In spite of this the last hemoglobin A1c I can see is 10.1 on 01/01/2017. She is being referred by 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 She is been using Medihoney to this area and also an area on the left midfoot. She is using healing sandals bilaterally. ABIs in this clinic at the left posterior tibial was 1.1 noncompressible on the right READMISSION Non invasive vascular NOVANT 5/18 Aftercare following surgery of the circulatory system Procedure Note - Interface, External Ris In - 08/13/2016 11:05 AM EDT Procedure: Examination consists of physiologic resting arterial pressures of the brachial and ankle arteries bilaterally with continuous wave Doppler waveform analysis. Previous: Previous exam performed on 02/14/16 demonstrated ABIs of Rt = 1.19 and Lt = 1.33. Right: ABI = non-compressible PT 1.47 DP. S/P transmet amputation. , Left: ABI = 1.52, 2nd digit pressure = 87 mmHg Conclusions: Right: ABI (>1.3) may be falsely elevated, suggesting medial calcification. Left: ABI (>1.3) may be falsely  elevated, suggesting medial calcification The patient is a now 49 year old type I diabetic is had multiple issues her graded to chronic diabetic foot ulcers. 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 We had her in the clinic lastin November. At that point she had wounds on her bilateral feet.she had wanted to try hyperbarics however the healogics review process denied her because she hadn't followed up with her vascular surgeon for her left femoropopliteal bypass. The bypass was done by Dr. Raul Del at [facility]. We made her a follow-up with Dr. Raul Del however she did not keep the appointment and therefore she was not approved The patient shows me a small wound on her left fourth metatarsal head on her phone. She developed rapid discoloration in the plantar aspect of the left foot and she was admitted to hospital from 2/2 through 05/10/17 with wet gangrene of the left foot osteomyelitis of the fourth metatarsal heads. She was admitted acutely ill with a temperature of 103.  She was started on broad-spectrum vancomycin and cefepime. On 05/06/17 she was taken to the OR by [name] her podiatric surgeon for an incision and drainage irrigation of the left foot wound. Cultures from this surgery revealed group be strep and anaerobes. she was seen by Dr.Xu of orthopedic surgery and scheduled for a below-knee amputation which she u refused. Ultimately she was discharged on Levaquin and Flagyl for one month. MRI 05/05/17 done while she was in the hospital showed abscess adjacent to the fourth metatarsal head and neck small abscess around the fourth flexor tendon. Inflammatory phlegmon and gas in the soft tissues along the lateral aspect of the fourth phalanx. Findings worrisome for osteomyelitis involving the fourth proximal and middle phalanx and also the third and fourth metatarsals. Finally the patient had actually shortly  before this followed up with Dr. Raul Del at no time on 04/29/17. He felt that her left femoropopliteal bypass was patent he felt that her left-sided toe pressures more than adequate for healing a wound on the left foot. This was before her acute presentation. 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 05/28/17; she is started hyperbarics. The patient tells me that for some reason she was not actually on Levaquin but I think on ciprofloxacin. She was on Flagyl. She only started her Levaquin yesterday due to some difficulty with the pharmacy and perhaps her sister picking it up. She has an appointment with [name] tomorrow and with infectious disease early next week. She has no new complaints 06/06/17; the patient continues in hyperbarics. She saw [name] on 05/29/17 who is her podiatric surgeon. He is elected for a transmetatarsal amputation on 06/27/17. I'm not sure at what level he plans to do this amputation. The patient is unaware She also saw Dr. Megan Salon of infectious disease who elected to continue her on current antibiotics I think this is ciprofloxacin and Flagyl. I'll need to clarify with her tomorrow if she actually has this. We're using silver alginate to the actual wound. Necrotic surface today with material under the flap of her foot. Original MRI showed abscesses as well as osteomyelitis of the proximal and middle fourth phalanx and the third and fourth metatarsal heads 06/11/17; patient continues in hyperbarics and continues on oral antibiotics. She is doing well. The wound looks better. The necrotic part of this under the flap in her superior foot also looks better. she is been to see [name]. I haven't had a chance to look at his note. Apparently he has put the transmetatarsal amputation on hold her request it is still planning to take her to the OR for debridement and product application ACEL. I'll see if I can find his note. I'll therefore leave product ordering/requests to [name]  for now. I was going to look at Dermagraft 06/18/17-she is here in follow-up evaluation for bilateral foot wounds. She continues with hyperbaric therapy. She states she has been applying manuka honey to the right plantar foot and alternate manuka honey and silver alginate to the left foot, despite our orders. We will continue with same treatment plan and she will follo up next week. 06/25/17; I have reviewed [name] last note from 3/11. She has operative debridement in 2 days' time. By review his note apparently they're going to place there is skin over the majority of this wound which is a good choice. She has a small satellite area at the most proximal part of this wound on the left plantar foot. The area on the right  plantar foot we've been using silver alginate and it is close to healing. 07/02/17; unfortunately the patient was not easily approved for [name] proposed surgery. I'm not completely certain what the issue is. She has been using silver alginate to the wound she has completed a first course of hyperbarics. She is still on Levaquin and Flagyl. I have really lost track of the time course here.I suspect she should have another week to 2 of antibiotics. I'll need to see if she is followed up with infectious disease Dr. Megan Salon 07/09/17; the patient is followed up with Dr. Megan Salon. 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 She continues on Levaquin and metronidazole continuing both of these for now I think she is been on fr about 6 weeks. She still has some drainage but no pain. No fever. Her had been plans for her to go to the OR for operative debridement with her podiatrist [name], I am not exactly sure where that is. I'll probably slip a note to [name] today. I note that she follows with Dr. Dwyane Dee of endocrinology. We have her recertified for hyperbaric oxygen. I have not heard about Dermagraft however I'll see if [name] is planning a  skin substitute as well 07/16/17; the patient tells me she is just about out of [place]. I'll need to check Dr. Hale Bogus last notes on this. She states she has plenty of Flagyl however. She comes in today complaining of pain in the right lateral foot which she said lasted for about a day. The wound on the right foot is actually much more medially. She also tells me that the [facility] cost a lot of pain in the left foot wound and she turned back to silver alginate. Finally Dermagraft has a $481 per application co-pay. She cannot afford this 07/23/17; patient arrives today with the wound not much smaller. There is not much new to add. She has not heard from [name] all try to put in a call to them today. She was asking about Dermagraft again and she has an over $856 per application co-pay she states that she would be willing to try to do a payment plan. I been tried to avoid this. We've been using silver alginate, I'll change to [facility] 07/30/17-She is here in follow-up evaluation for left foot ulcer. She continues hyperbaric medicine. The left foot ulcer is stable we will continue with same treatment plan 08/06/17; she is here for evaluation of her left foot ulcer. Currently being treated for hyperbarics or underlying osteomyelitis. She is completed antibiotics. The left foot ulcer is better smaller with healthier looking granulation. For various reasons I am not really clear on we never got her back to the OR with [name]. He did not respond to my secure text message. Nevertheless I think that surgery on this point is not necessary nor am I completely clear that a skin substitute is necessary The patient is complaining about pain on the outside of her right foot. 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 There is no erythema. She also states the foot is warm versus her other part of her upper leg and this is largely true. It is not totally clear to me what's causing this. She  thinks it's different from her usual neuropathy pain 08/13/17; she arrives in clinic today with a small wound which is superficial on her right first metatarsal head. 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 She tells [place] she was up on her feet over the Mother's Day celebration. The large wound is on the left foot. Continues with hyperbarics for underlying osteomyelitis. We're using Hydrofera Blue. She asked me today about where we were with Dermagraft. I had actually excluded this because of the co-pay however she wants to assume this therefore I'll recheck the co-pay an order for next week. 08/20/17; the patient agreed to accept the co-pay of the first Apligraf which we applied today. She is disappointed she is finishing hyperbarics will run this through the insurance on the extent of the foot infection and the extent of the wound that she had however she is already had 60 dive's. Dermagraft No. 1 08/27/17; Dermagraft No. 2. She is not eligible for any more hyperbaric treatments this month. She reports a fair amount of drainage and she actually changed to the external dressings without disturbing the direct contact layer 09/03/17; the patient arrived in clinic today with the wound superficially looking quite healthy. Nice vibrant red tissue with some advancing epithelialization although not as much adherence of the flap as I might like. However she noted on her own fourth toe some bogginess and she brought that to our attention. Indeed this was boggy feeling like a possibility of subcutaneous fluid. 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 She is not been unwell. We've been using Dermagraft 09/10/17; the culture that I did not last week was MRSA. She saw Dr. Megan Salon this morning who is going to start her on vancomycin. I had sent him a secure a text message yesterday. I also spoke with her podiatric surgeon  [name] about surgery on this foot the options for conserving a functional foot etc. Promised me he would see her and will make back consultation today. Paradoxically her actual wound on the plantar aspect of her left foot looks really quite good. I had given her 5 days worth of Baxdella to cover her for MRSA. Her MRI came back showing osteomyelitis within the third metatarsal shaft and head and base of the third and fourth proximal phalanx. She had extensive inflammatory changes throughout the soft tissue of the lateral forefoot. With an ill-defined fluid around the fourth metatarsal extending into the plantar and dorsal soft tissues 09/19/17; the patient is actually on oral Septra and Flagyl. She apparently refused IV vancomycin. She also saw [name] at my request who is planning her for a left BKA sometime in mid July. MRI showed osteomyelitis within the third metatarsal shaft and head and the basis of the third and fourth proximal phalanx. I believe there was felt to be possible septic arthritis involving the third MTP. 09/26/17; the patient went back to Dr. Megan Salon at my suggestion and is now receiving IV daptomycin. Her wound continues to look quite good making the decision to proceed with a transmetatarsal amputation although more difficult for the patient. I believe in my extensive discussions with her she has a good sense of the pros and cons of this. I don't NV the tuft decision she has to make. She has an appointment with [name] I believe in mid July and I previously spoken to him about this issue Has we had used 3 previous Dermagraft. Given the condition of the wound surface I went ahead and added the fourth one today area and I did this not fully realizing that she'll be traveling  to [place] next week. I'm hopeful she can come back in 2 weeks 10/21/17; Her same Dermagraft on for about 3-1/2 weeks. In spite of this the wound arrives looking quite healthy. There is been a lot of  healing dimensions are smaller. Looking at the square shaped wound she has now there is some undermining and some depth medially under the undermining although I cannot palpate any bone. No surrounding infection is obvious. She has difficult questions about how to look at this going forward vis--vis amputations versus continued medical therapy. T be truthful the wound is looks o so healthy and it is continued to contract. Hard to justify foot surgery at this point although I still told her that I think it might come to that if we are not able to eradicate the underlying MRSA. She is still highly at risk and she understands this 11/06/17 on evaluation today patient appears to be doing better in regard to her foot ulcer. She's been tolerating the dressing changes without complication. Currently she is here for her Dermagraft #6. Her wound continues to make excellent progress at this point. She does not appear to have any evidence of infection which is good news. 11/13/17 on evaluation today patient appears to be doing excellent at this time. She is here for repeat Dermagraft application. This is #7. Overall her wound seems to be making great progress. 12/05/17; the patient arrives with the wound in much better condition than when I last saw this almost 6 weeks ago. She still has a small probing area in the left metatarsal head region on the lateral aspect of her foot. We applied her last Dermagraft today. Since the last time she is here she has what appears to a been a blood blister on the plantar aspect of left foot although I don't see this is threatening. There is also a thick raised tissue on the right mid metatarsal head region. This was not there I don't think the last time she was here 3 weeks ago. 12/12/17; the patient continues to have a small programming area in the left metatarsal head region on the lateral aspect of her foot which was the initial large surgical wound. I applied her last Apligraf  last week. I'm going to use Endoform starting today Unfortunately she has an excoriated area in the left mid foot and the right mid foot. The left midfoot looks like a blistered area this was not opened last week it certainly is open today. Using silver alginate on these areas. She promises me she is offloading this. 12/19/17; the small probing area in the left metatarsal head eyes think is shallower. In general her original wound looks better. We've been using Endoform. The area inferiorly that I think was trauma last week still requires debridement a lot of nonviable surface which I removed. She still has an open open area distally in her foot Similarly on the right foot there is tightly adherent surface debris which I removed. Still areas that don't look completely epithelialized. This is a small open area. We used silver alginate on these areas 12/26/2017; the patient did not have the supplies we ordered from last week including the Endoform. The original large wound on the left lateral foot looks healthy. She still has the undermining area that is largely unchanged from last week. She has the same heavily callused raised edged wounds on the right mid and left midfoot. Both of these requiring debridement. We have been using silver alginate on these areas 01/02/2018;  there is still supply issues. We are going to try to use Prisma but I am not sure she actually got it from what she is saying. She has a new open area on the lateral aspect of the left fourth toe [previous fifth ray amputation]. Still the one tunneling area over the fourth metatarsal head. The area is in the midfoot bilaterally still have thick callus around them. She is concerned about a raised swelling on the lateral aspect of the foot. However she is completely insensate 01/10/2018; we are using Prisma to the wounds on her bilateral feet. Surprisingly the tunneling area over the left fourth metatarsal head that was part of  her original surgery has closed down. She has a small open area remaining on the incision line. 2 open areas in the midfoot. 02/10/2018; the patient arrives back in clinic after a month hiatus. She was traveling to visit family in [place]. Is fairly clear she was not offloading the areas on her feet. The original wound over the left lateral foot at the level of metatarsal heads is reopened and probes medially by about a centimeter or 2. She notes that a week ago she had purulent drainage come out of an area on the left midfoot. Paradoxically the worst area is actually on the right foot is extensive with purulent drainage. We will use silver alginate today 02/17/2018; the patient has 3 wounds one over the left lateral foot. She still has a small area over the metatarsal heads which is the remnant of her original surgical wound. This has medial probing depth of roughly 1.4 cm somewhat better than last week. The area on the right foot is larger. We have been using silver alginate to all areas. The area on the right foot and left foot that we cultured last week showed both Klebsiella and Proteus. Both of these are quinolone sensitive. The patient put her's self on Bactrim and Flagyl that she had left hanging around from prior antibiotic usages. She was apparently on this last week when she arrived. I did not realize this. Unfortunately the Bactrim will not cover either 1 of these organisms. We will send in Cipro 500 twice daily for a week 03/04/2018; the patient has 2 wounds on the left foot one is the original wound which was a surgical wound for a deep DFU. At one point this had exposed bone. She still has an area over the fourth metatarsal head that probes about 1.4 cm although I think this is better than last week. I been using silver nitrate to try and promote tissue adherence and been using silver alginate here. She also has an area in the left midfoot. This has some depth but a small linear wound.  Still requiring debridement. On the right midfoot is a circular wound. A lot of thick callus around this area. We have been using silver alginate to all wound areas She is completed the ciprofloxacin I gave her 2 weeks ago. 03/11/2018; the patient continues to have 2 open areas on the left foot 1 of which was the original surgical wound for a deep DFU. Only a small probing area remains although this is not much different from last week we have been using silver alginate. The other area is on the midfoot this is smaller linear but still with some depth. We have been using silver alginate here as well On the right foot she has a small circular wound in the mid aspect. This is not much smaller than last  time. We have been using silver alginate here as well 03/18/2018; she has 3 wounds on the left foot the original surgical wound, a very superficial wound in the mid aspect and then finally the area in the mid plantar foot. She arrives in today with a very concerning area in the wound in the mid plantar foot which is her most proximal wound. There is undermining here of roughly 1-1/2 cm superiorly. Serosanguineous drainage. She tells me she had some pain on for over the weekend that shot up her foot into her thigh and she tells me that she had a nodule in the groin area. She has the single wound in the right foot. We are using endoform to both wound areas 03/24/2018; the patient arrives with the original surgical wound in the area on the left midfoot about the same as last week. There is a collection of fluid under the surface of the skin extending from the surgical wound towards the midfoot although it does not reach the midfoot wound. The area on the right foot is about the same. Cultures from last week of the left midfoot wound showed abundant Klebsiella abundant Enterococcus faecalis and moderate methicillin resistant staph I gave her Levaquin but this would have only covered the Klebsiella. She  will need linezolid 04/01/2018; she is taking linezolid but for the first few days only took 1 a day. I have advised her to finish this at twice daily dosing. In any case all of her wounds are a lot better especially on the left foot. The original surgical wound is closed. The area on the left midfoot considerably smaller. The area on the right foot also smaller. 04/08/2018; her original surgical wound/osteomyelitis on the left foot remains closed. She has area on the left foot that is in the midfoot area but she had some streaking towards this. This is not connected with her original wound at least not visually. Small wound on the right midfoot appears somewhat smaller. 04/15/18; both wounds looks better. Original wound is better left midfoot. Using silver alginate 1/21; patient states she uses saltwater soak in, stones or remove callus from around her wounds. She is also concerned about a blood blister she had on the left foot but it simply resolved on its own. We've been using silver alginate 1/28; the patient arrives today with the same streaking area from her metatarsals laterally [the site of her original surgical wound] down to the middle of her foot. There is some drainage in the subcutaneous area here. This concerns me that there is actually continued ongoing infection in the metatarsals probably the fourth and third. This fixates an MRI of the foot without contrast [chronic renal failure] The wound in the mid part of the foot is small but I wonder whether this area actually connects with the more distal foot. The area on the right midfoot is probably about the same. Callus thick skin around the small wound which I removed with a curette we have been using silver alginate on both wound areas 2/4; culture I did of the draining site on the left foot last time grew methicillin sensitive staph aureus. MRI of the left foot showed interval resolution of the findings surrounding the third metatarsal  joint on the prior study consistent with treated osteomyelitis. Chronic soft tissue ulceration in the plantar and lateral aspect of the forefoot without residual focal fluid collection. No evidence of recurrent osteomyelitis. Noted to have the previous amputation of the distal first phalanx and fifth  ray MRI of the right foot showed no evidence of osteomyelitis I am going to treat the patient with a prolonged course of antibiotics directed against MSSA in the left foot 2/11; patient continues on cephalexin. She tells me she had nausea and vomiting over the weekend and missed 2 days. In general her foot looks much the same. She has a small open area just below the left fourth metatarsal head. A linear area in the left midfoot. Some discoloration extending from the inferior part of this into the left lateral foot although this appears to be superficial. She has a small area on the right midfoot which generally looks smaller after debridement 2/18; the patient is completing his cephalexin and has another 2 days. She continues to have open areas on the left and right foot. 2/25; she is now off antibiotics. The area on the left foot at the site of her original surgical wound has closed yet again. She still has open areas in the mid part of her foot however these appear smaller. The area on the right mid foot looks about the same. We have been using silver alginate She tells me she had a serious hypoglycemic spell at home. She had to have EMS called and get IV dextrose 3/3; disappointing on the left lateral foot large area of necrotic tissue surrounding the linear area. This appears to track up towards the same original surgical wound. Required extensive debridement. The area on the right plantar foot is not a lot better also using silver 3/12; the culture I did last time showed abundant enterococcus. I have prescribed Augmentin, should cover any unrecognized anaerobes as well. In addition there were a few  MRSA and Serratia that would not be well covered although I did not want to give her multiple antibiotics. She comes in today with a new wound in the right midfoot this is not connected with the original wound over her MTP a lot of thick callus tissue around both wounds but once again she said she is not walking on these areas 3/17-Patient comes in for follow-up on the bilateral plantar wounds, the right midfoot and the left plantar wound. Both these are heavily callused surrounding the wounds. We are continuing to use silver alginate, she is compliant with offloading and states she uses a wheelchair fairly often at home 3/24; both wound areas have thick callus. However things actually look quite a bit better here for the majority of her left foot and the right foot. 3/31; patient continues to have thick callused somewhat irritated looking tissue around the wounds which individually are fairly superficial. There is no evidence of surrounding infection. We have been using silver alginate however I change that to [facility] today 4/17; patient returns to clinic after having a scare with Covid she tested negative in her primary doctor's office. She has been using Hydrofera Blue. She does not have an open area on the right foot. On the left foot she has a small open area with the mid area not completely viable. She showed me pictures of what looks like a hemorrhagic blister from several days ago but that seems to have healed over this was on the lateral left foot 4/21; patient comes in to clinic with both her wounds on her feet closed. However over the weekend she started having pain in her right foot and leg up into the thigh. She felt as though she was running a low-grade fever but did not take her temperature. She took a  doxycycline that she had leftover and yesterday a single Septra and metronidazole. She thinks things feel somewhat better. 4/28; duplex ultrasound I ordered last week was negative  for DVT or superficial thrombophlebitis. She is completed the doxycycline I gave her. States she is still having a lot of pain in the right calf and right ankle which is no better than last week. She cannot sleep. She also states she has a temperature of up to 101, coughing and complaining of visual loss in her bilateral eyes. Apparently she was tested for Covid 2 weeks ago at [facility] and that was negative. Readmission: 09/03/18 patient presents back for reevaluation after having been evaluated at the end of April regarding erythema and swelling of her right lower extremity. Subsequently she ended up going to the hospital on 07/29/18 and was admitted not to be discharged until 08/08/18. Unfortunately it was noted during the time that she was in the hospital that she did have methicillin-resistant Staphylococcus aureus as the infection noted at the site. It was also determined that she did have osteomyelitis which appears to be fairly significant. She was treated with vancomycin and in fact is still on IV vancomycin at dialysis currently. This is actually slated to continue until 09/12/18 at least which will be the completion of the six weeks of therapy. Nonetheless based on what I'm seeing at this point I'm not sure she will be anywhere near ready to discontinue antibiotics at that time. Since she was released from the hospital she was seen by [name] who is her podiatrist on 08/27/18. His note specifically states that he is recommended that the patient needs of one knee amputation on the right as she has a life- threatening situation that can lead quickly to sepsis. The patient advised she would like to try to save her leg to which [name] apparently told her that this was against all medical advice. She also want to discontinue the Wound VAC which had been initiated due to the fact that she wasn't pleased with how the wound was looking and subsequently she wanted to pursue applying Medihoney at that time.  He stated that he did not believe that the right lower extremity was salvageable and that the patient understood but would still like to attempt hyperbaric option therapy if it could be of any benefit. She was therefore referred back to [place] for further evaluation. He plans to see her back next week. Upon inspection today patient has a significant amount purulent drainage noted from the wound at this point. The bone in the distal portion of her foot also appears to be extremely necrotic and spongy. When I push down on the bone it bubbles and seeps purulent drainage from deeper in the end of the foot. I do not think that this is likely going to heal very well at all and less aggressive surgical debridement were undertaken more than what I believe we can likely do here in our office. 09/12/2018; I have not seen this patient since the most recent hospitalization although she was in our clinic last week. I have reviewed some of her records from a complex hospitalization. 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 There is situation was complicated by MRSA bacteremia and acute on chronic renal failure now on dialysis. She is receiving vancomycin at dialysis. We started her on Dakin's wet-to-dry last week she is changing this daily. There is still purulent drainage coming out of her foot.  Although she is apparently "agreeable" to a below-knee amputation which is been suggested by multiple clinicians she wants this to be done in [place]. She apparently has a telehealth visit with that provider sometime in late Tye 6/24. I have told her I think this is probably too long. Nevertheless I could not convince her to allow a local doctor to perform BKA. 09/19/2018; the patient has a large necrotic area on the right anterior foot. She has had previous transmetatarsal amputations. Culture I did last week showed MRSA nothing else she is on vancomycin at dialysis. She  has continued leaking purulent drainage out of the distal part of the large circular wound on the right anterior foot. She apparently went to see Dr. 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 Somehow that translated into her being referred to plastic surgery for debridement of the area. I gather she basically refused amputation although I do not have a copy of Dr. Berenice Primas notes. The patient really wants to have a trial of hyperbaric oxygen. I agreed with initial assessment in this clinic that this was probably too far along to benefit however if she is going to have plastic surgery I think she would benefit from ancillary hyperbaric oxygen. The issue here is that the patient has benefited as maximally as any patient I have ever seen from hyperbaric oxygen therapy. Most recently she had exposed bone on the lateral part of her left foot after a surgical procedure and that actually has closed. She has eschared areas in both heels but no open area. She is remained systemically well. I am not optimistic that anything can be done about this but the patient is very clear that she wants an attempt. The attempt would include a wound VAC further debridements and hyperbaric oxygen along with IV antibiotics. 6/26; I put her in for a trial of hyperbaric oxygen only because of the dramatic response she has had with wounds on her left midfoot earlier this year which was a surgical wound that went straight to her bone over the metatarsal heads and also remotely the left third toe. We will see if we can get this through our review process and insurance. She arrives in clinic with again purulent material pouring out of necrotic bone on the top of the foot distally. There is also some concerning erythema on the front of the leg that we marked. It is bit difficult to tell how tender this is because of neuropathy. I note from infectious disease that she had her vancomycin extended. All the  cultures of these areas have shown MRSA sensitive to vancomycin. She had the wound VAC on for part of the week. The rest of the time she is putting various things on this including Medihoney, "ionized water" silver sorb gel etc. 7/7; follow-up along with HBO. She is still on vancomycin at dialysis. She has a large open area on the dorsal right foot and a small dark eschar area on her heel. There is a lot less erythema in the area and a lot less tenderness. From an infection point of view I think this is better. She still has a lot of necrosis in the remaining right forefoot [previous TMA] we are still using the wound VAC in this area 7/16; follow-up along with HBO. I put her on linezolid after she finished her vancomycin. We started this last Friday I gave her 2 weeks worth. I had the expectation that she would be operatively debrided by Dr. Marla Roe  but that still has not happened yet. Patient phoned the office this week. She arrives for review today after HBO. The distal part of this wound is completely necrotic. Nonviable pieces of tendon bone was still purulent drainage. Also concerning that she has black eschar over the heel that is expanding. I think this may be indicative of infection in this area as well. She has less erythema and warmth in the ankle and calf but still an abnormal exam 7/21 follow-up along with HBO. I will renew her linezolid after checking a CBC with differential monitoring her blood counts especially her platelets. She was supposed to have surgery yesterday but if I am reading things correctly this was canceled after her blood sugar was found to be over 500. I thought Dr. Marla Roe who called me said that they were sending her to the ER but the patient states that was not the case. 7/28. Follow-up along with HBO. She is on linezolid I still do not have any lab work from dialysis even though I called last week. The patient is concerned about an area on her left lateral  foot about the level of the base of her fifth metatarsal. I did not really see anything that ominous here however this patient is in [place] ability to point out problems that she is sensing and she has been accurate in the past Finally she received a call from Dr. Marla Roe who is referring her to another orthopedic surgeon stating that she is too booked up to take her to the operating room now. Was still using a wound VAC on the foot 8/3 -Follow-up after HBO, she is got another week of linezolid, she is to call ID for an appointment, x-rays of both feet were reviewed, the left foot x-ray with third MTP joint osteo- Right foot x-ray widespread osteo-in the right midfoot Right ankle x-ray does not show any active evidence of infection 8/11-Patient is seen after HBO, the wounds on the right foot appear to be about the same, the heel wound had some necrotic base over tendon that was debrided with a curette 8/21; patient is seen after HBO. The patient's wound on her dorsal foot actually looks reasonably good and there is substantial amount of epithelialization however the open area distally still has a lot of necrotic debris partially bone. I cannot really get a good sense of just how deep this probes under the foot. She has been pressuring me this week to order medical maggots through a company in [place] for her. The problem I have is there is not a defined wound area here. On the positive side there is no purulence. She has been to see infectious disease she is still on Septra DS although I have not had a chance to review their notes 8/28; patient is seen in conjunction with HBO. The wounds on her foot continued to improve including the right dorsal foot substantially the, the distal part of this wound and the area on the right heel. We have been using a wound VAC over this chronically. She is still on trimethoprim as directed by infectious disease 9/4; patient is seen in conjunction with HBO.  Right dorsal foot wound substantially anteriorly is better however she continues to have a deep wound in the distal part of this that is not responding. We have been using silver collagen under border foam Area on the right plantar medial heel seems better. We have been using Hydrofera Blue 12/12/18 on evaluation today patient appears to be  doing about the same with regard to her wound based on prior measurements. She does have some necrotic tissue noted on the lateral aspect of the wound that is going require a little bit of sharp debridement today. This includes what appears to be potentially either severely necrotic bone or tendon. Nonetheless other than that she does not appear to have any severe infection which is good news 9/18; it is been 2 weeks since I saw this wound. She is tolerating HBO well. Continued dramatic improvement in the area on the right dorsal foot. She still has a small wound on the heel that we have been using Hydrofera Blue. She continues with a wound VAC 9/24; patient has to be seen emergently today with a swelling on her right lateral lower leg. She says that she told Dr. Evette Doffing about this and also myself on a couple of occasions but I really have no recollection of this. She is not systemically unwell and her wound really looked good the last time I saw this. She showed this to providers at dialysis and she was able to verify that she was started on cephalexin today for 5 doses at dialysis. She dialyzes on Tuesday Thursday and Saturday. 10/2; patient is seen in conjunction with HBO. The area that is draining on the right anterior medial tibia is more extensive. Copious amounts of serosanguineous drainage with some purulence. We are still using the wound VAC on the original wound then it is stable. Culture I did of the original IandD showed MRSA I contacted dialysis she is now on vancomycin with dialysis treatments. I asked them to run a month 10/9; patient seen in  conjunction with HBO. She had a new spontaneous open area just above the wound on the right medial tibia ankle. More swelling on the right medial tibia. Her wound on the foot looks about the same perhaps slightly better. There is no warmth spreading up her leg but no obvious erythema. her MRI of the foot and ankle and distal tib-fib is not booked for next Friday I discussed this with her in great detail over multiple days. it is likely she has spreading infection upper leg at least involving the distal 25% above the ankle. She knows that if I refer her to orthopedics for infectious disease they are going to recommend amputation and indeed I am not against this myself. We had a good trial at trying to heal the foot which is what she wanted along with antibiotics debridement and HBO however she clearly has spreading infection [probably staph aureus/MRSA]. Nevertheless she once again tells me she wants to wait the left of the MRI. She still makes comments about having her amputation done in [place]. 10/19; arrives today with significant swelling on the lateral right leg. Last culture I did showed Klebsiella. Multidrug-resistant. Cipro was intermediate sensitivity and that is what I have her on pending her MRI which apparently is going to be done on Thursday this week although this seems to be moving back and forth. She is not systemically unwell. We are using silver alginate on her major wound area on the right medial foot and the draining areas on the right lateral lower leg 10/26; MRI showed extensive abscess in the anterior compartment of the right leg also widespread osteomyelitis involving osseous structures of the midfoot and portions of the hindfoot. Also suspicion for osteomyelitis anterior aspect of the distal medial malleolus. Culture I did of the purulence once again showed a multidrug-resistant Klebsiella. I have been  in contact with nephrology late last week and she has been started on  cefepime at dialysis to replace the vancomycin We sent a copy of her MRI report to Dr. Geroge Baseman in [place] who is an orthopedic surgeon. The patient takes great stock in his opinion on this. She says she will go to [place] to have her leg amputated if Dr. Geroge Baseman does not feel there is any salvage options. 11/2; she still is not talk to her orthopedic surgeon in [place]. Apparently he will call her at [phone] this afternoon. The quality of this is she has not allowed me to refer her anywhere. She has been told over and over that she needs this amputated but has not agreed to be referred. She tells me her blood sugar was 600 last night but she has not been febrile. 11/9; she never did got a call from the orthopedic surgeon in [place] therefore that is off the radar. We have arranged to get her see orthopedic surgery at [facility]. She still has a lot of draining purulence coming out of the new abscess in her right leg although that probably came from the osteomyelitis in her right foot and heel. Meanwhile the original wound on the right foot looks very healthy. Continued improvement. The issue is that the last MRI showed osteomyelitis in her right foot extensively she now has an abscess in the right anterior lower leg. There is nobody in [place] who will offer this woman anything but an amputation and to be honest that is probably what she needs. I think she still wants to talk about limb salvage although at this point I just do not see that. She has completed her vancomycin at dialysis which was for the original staph aureus she is still on cefepime for the more recent Klebsiella. She has had a long course of both of these antibiotics which should have benefited the osteomyelitis on the right foot as well as the abscess. 11/16; apparently [place] elective surgery is shut down because of COVID-19 pandemic. I have reached out to some contacts at [facility] to see if we can get her an  orthopedic appointment there. I am concerned about continually leaving this but for the moment everything is static. In fact her original large wound on this foot is closing down. It is the abscess on the right anterior leg that continues to drain purulent serosanguineous material. She is not currently on any antibiotics however she had a prolonged course of vancomycin [1 month] as well as cefepime for a month 02/24/2019 on evaluation today patient appears to be doing better than the last time I saw her. This is not a patient that I typically see. With that being said I am covering for Dr. Dellia Nims this week and again compared to when I last saw her overall the wounds in particular seem to be doing significantly better which is good news. With that being said the patient tells me several disconcerting things. She has not been able to get in to see anyone for potential debridement in regard to her leg wounds although she tells me that she does not think it is necessary any longer because she is taking care of that herself. She noticed a string coming out of the lower wound on her leg over the last week. The patient states that she subsequently decided that we must of pack something in there and started pulling the string out and as it kept coming and coming she realized this was likely her  tendon. With that being said she continued to remove as much of this as she could. She then I subsequently proceeded to using tubes of antibiotic ointment which she will stick down into the wound and then scored as much as she can until she sees it coming out of the other wound opening. She states that in doing this she is actually made things better and there is less redness and irritation. With regard to her foot wound she does have some necrotic tendon and tissue noted in one small corner but again the actual wound itself seems to be doing better with good granulation in general compared to my last evaluation. 12/7;  continued improvement in the wound on the substantial part of the right medial foot. Still a necrotic area inferiorly that required debridement but the rest of this looks very healthy and is contracting. She has 2 wounds on the right lateral leg which were her original drainage sites from her abscess but all of this looks a lot better as well. She has been using silver alginate after putting antibiotic biotic ointment in one wound and watching it come out the other. I have talked to her in some detail today. I had given her names of orthopedic surgeons at [facility] for second opinion on what to do about the right leg. I do not think the patient never called them. She has not been able to get a hold of the orthopedic surgeon in [place] that she had put a lot of faith in as being somebody would give her an opinion that she would trust. I talked to her today and said even if I could get her in to another orthopedic surgeon about the leg which she accept an amputation and she said she would not therefore I am not going to press this issue for the moment 12/14; continued improvement in his substantial wound on the right medial foot. There is still a necrotic area inferiorly with tightly adherent necrotic debris which I have been working on debriding each time she is here. She does not have an orthopedic appointment. Since last time she was here I looked over her cultures which were essentially MRSA on the foot wound and gram-negative rods in the abscess on the anterior leg. 12/21; continued improvement in the area on the right medial foot. She is not up on this much and that is probably a good thing since I do not know it could support continuous ambulation. She has a small area on the right lateral leg which were remanence of the IandD's I did because of the abscess. I think she should probably have prophylactic antibiotics I am going to have to look this over to see if we can make an intelligent  decision here. In the meantime her major wound is come down nicely. Necrotic area inferiorly is still there but looks a lot better 04/06/2019; she has had some improvement in the overall surface area on the right medial foot somewhat narrowedr both but somewhat longer. The areas on the right lateral leg which were initial IandD sites are superficial. Nothing is present on the right heel. We are using silver alginate to the wound areas 1/18; right medial foot somewhat smaller. Still a deep probing area in the most distal recess of the wound. She has nothing open on the right leg. She has a new wound on the plantar aspect of her left fourth toe which may have come from just pulling skin. The patient using Medihoney on  the wound on her foot under silver alginate. I cannot discourage her from this 2/1; 2-week follow-up using silver alginate on the right foot and her left fourth toe. The area on the right dorsal foot is contracted although there is still the deep area in the most distal part of the wound but still has some probing depth. No overt infection 2/15; 2-week follow-up. She continues to have improvement in the surface area on the dorsal right foot. Even the tunneling area from last time is almost closed. The area that was on the plantar part of her left fourth toe over the PIP is indeed closed 3/1; 2-week follow-up. Continued improvement in surface area. The original divot that we have been debriding inferiorly I think has full epithelialization although the epithelialization is gone down into the wound with probably 4 mm of depth. Even under intense illumination I am unable to see anything open here. The remanence of the wound in this area actually look quite healthy. We have been using silver alginate 3/15; 2-week follow-up. Unfortunately not as good today. She has a comma shaped wound on the dorsal foot however the upper part of this is larger. Under illumination debris on the surface She also  tells [place] that she was on her right leg 2 times in the last couple of weeks mostly to reach up for things above her head etc. She felt a sharp pain in the right leg which she thinks is somewhere from the ankle to the knee. The patient has neuropathy and is really uncertain. She cannot feel her foot so she does not think it was coming from there 3/29; 2-week follow-up. Her wound measures smaller. Surface of the wound appears reasonable. She is using silver alginate with underlying Medihoney. She has home health. X-rays I did of her tib-fib last time were negative although it did show arterial calcification 4/12; 2-week follow-up. Her wound measures smaller in length. Using manuka honey with silver alginate on top. She has home health. 4/26; 2-week follow-up. Her wound is smaller but still very adherent debris under illumination requiring debridement she has been using manuka honey with silver alginate. 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 Been using Hydrofera Blue. She is on suppressive Bactrim but apparently she has been taking it twice daily 6/7; I have not seen this wound and about 6 weeks. Since then she was up in [place]. By her own admission she was walking on the foot because she did not have a wheelchair. The wound is not nearly as healthy looking as it was the last time I saw this. We ordered different things for her but she only uses Medihoney and silver alginate. As far as I know she is on suppressive trimethoprim sulfamethoxazole. She does not admit to any fever or chills. Her CBGs apparently are at baseline however she is saying that she feels some discomfort on the lateral part of her ankle I looked over her last inflammatory markers from the summer 2020 at which time she had a deeply necrotic infected wound in this area. On 11/10/2018 her sedimentation rate was 56 and C-reactive  protein 9.9. This was 107 and 29 on 07/29/2018. 6/17; the patient had a necrotic wound the last time she was here on the right dorsal foot. After debridement I did a culture. This showed a very resistant ESBL Klebsiella as well as Enterococcus. Her  x-ray of the foot which was done because of warmth and some discomfort showed bone destruction within the carpal bones involving the navicular acute cuboid lateral middle cuneiforms but essentially unchanged from her prior study which was done on 10/29/2018. The findings were felt to represent chronic osteomyelitis. We did inflammatory markers on her. Her white count was 5.25 sedimentation rate 16 and C-reactive protein at 11.1. Notable for the fact that in August 2020 her CRP was 9.9 and sedimentation rate 56. I have looked at her x-rays. It is true that the bone destruction is very impressive however the patient came into this clinic for the wound on her right foot with pieces of bone literally falling out anteriorly with purulent material. I am not exactly sure I could have expected anything different. She has not been systemically unwell no fever chills or blood sugars have been reasonable. 6/28; she arrives with a right heel closed. The substantial area on the right anterior foot looks healthy. Much better looking surface. I think we can change to [facility] seems to help this previously. She is getting her antibiotics at dialysis she should be just about finished 7/9; changed to [facility] last week. Surface wound looks satisfactory not much change in surface area however. She is going to [place] state next week this is usually a difficult thing for this patient follow-up will be for 2 weeks. 7/23; using Hydrofera Blue. She returns from her trip and the wound looks surprisingly good. Usually when this patient goes on trips she comes back with a lot of problems with the wound. She is saying that she sometimes feels an episodic "crunching"  feeling on the lateral part of the foot. She is neuropathic and not feeling pain but wonders whether this could be a neuropathic dysesthesia. 11/13/19-Patient returns after 3 weeks, the wound itself is stable and patient states that there is nothing new going on she is on some extra anxiety medications and is resisting the temptation to pick at the dry skin around the wound. 9/20; patient has not been here in over a month and I have not seen her in 2 months. The wound in terms of size I think is about the same. There is no exposed bone. She has a nonviable surface on this. She is supposed to be using [facility] however she is also been using some form of honey preparation as well as a silver-based dressings. I do not think she has any pattern to this. 10/4; 2-week follow-up. Patient has been using some form of spray which she says has honey and silver to purchase this online she has been covering it with gauze. In spite of this the wound actually looks quite good. The deeper divot distally appears to be close down. There is a rim of epithelialization. 10/18; 2-week follow-up. Patient has been using her Hydrofera Blue covered with her silver honey spray that she got online. 11/1; 2-week follow-up. She is using Hydrofera Blue with a silver honey spray. Wound bed is measuring smaller. She has noticed that her foot is warmer on the right. She is concerned about infection. For a long period of time I had her on prophylactic trimethoprim sulfamethoxazole DS 1 tablet daily. She is asking for this to be restarted. The patient is walking on this foot because of repairs that are being done in a home her but her room is on the second floor she has to go up and down stairs. I have cautioned against this however as  usual she will do exactly what she wants to do 11/15; 2-week follow-up. She uses Hydrofera Blue with a silver/honey spray which I have never heard of. I think her wound looks about the same.  Some epithelialization. No evidence that this is infected. I think she is walking on this more than we agreed on. She is going on extensive vacation over Thanksgiving 12/3; 2-week follow-up. She is using Hydrofera Blue however over Thanksgiving she ran out of this and she is simply been using Medihoney. In spite of this her wound is smaller almost divided into 2 now. She traveled extensively over Thanksgiving and actually looks quite good in spite of this. Usually this is been a marker of problems for her Electronic Signature(s) Signed: 03/04/2020 5:12:12 PM By: Linton Ham MD Entered By: Linton Ham on 03/04/2020 13:52:57 -------------------------------------------------------------------------------- Physical Exam Details Patient Name: Date of Service: [name] 03/04/2020 2:30 PM Medical Record Number: [id number] Patient Account Number: [id number] Date of Birth/Sex: Treating RN: 1970-12-06 (49 y.o. [name] Primary Care Provider: [name], [name] Other Clinician: Referring Provider: Treating Provider/Extender: [name], [name] Weeks in Treatment: 57 Respiratory work of breathing is normal. Cardiovascular Pedal pulses are palpable of both the dorsalis pedis and posterior tibial on the right. Notes Wound exam; the wound is actually smaller. Reasonably healthy looking surface although under illumination there is still some surface debris but I did not debride this. No erythema no tenderness and no warmth Electronic Signature(s) Signed: 03/04/2020 5:12:12 PM By: Linton Ham MD Entered By: Linton Ham on 03/04/2020 13:53:52 -------------------------------------------------------------------------------- Physician Orders Details Patient Name: Date of Service: [name] 03/04/2020 2:30 PM Medical Record Number: [id number] Patient Account Number: [id number] Date of Birth/Sex: Treating RN: Mar 17, 1971 (49 y.o. 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 Bathing/ Shower/ Hygiene May shower and wash wound with soap and water. Edema Control - Lymphedema / SCD / Other Bilateral Lower Extremities Elevate legs to the level of the heart or above for 30 minutes daily and/or when sitting, a frequency of: Avoid standing for long periods of time. Moisturize legs daily. - with dressing changes Off-Loading Other: - minimal weight bearing right foot Home Health No change in wound care orders this week; continue Home Health for wound care. May utilize formulary equivalent dressing for wound treatment orders unless otherwise specified. Other Home Health Orders/Instructions: - Interim Wound Treatment Wound #43 - Foot Wound Laterality: Right, Medial Cleanser: Normal Saline (Home Health) Every Other Day/30 Days Discharge Instructions: Cleanse the wound with Normal Saline prior to applying a clean dressing using gauze sponges, not tissue or cotton balls. Cleanser: Wound Cleanser [facility]) Every Other Day/30 Days Discharge Instructions: Cleanse the wound with wound cleanser prior to applying a clean dressing using gauze sponges, not tissue or cotton balls. Prim Dressing: Hydrofera Blue Ready Foam, 4x5 in [facility]) Every Other Day/30 Days ary Discharge Instructions: Apply to wound bed as instructed Secondary Dressing: Woven Gauze Sponge, Non-Sterile 4x4  in [facility]) Every  Other Day/30 Days Discharge Instructions: Apply over primary dressing as directed. Secured With: Elastic Bandage 4 inch (ACE bandage) (Home Health) Every Other Day/30 Days Discharge Instructions: Secure with ACE bandage as directed. Secured With: [employer], 4.5x3.1 (in/yd) [facility]) Every Other Day/30 Days Discharge Instructions: Secure with Kerlix as directed. Secured With: Paper Tape, 2x10 (in/yd) (Home Health) Every Other Day/30 Days Discharge Instructions: Secure dressing with tape as directed. Electronic Signature(s) Signed: 03/04/2020 5:01:53 PM By: Baruch Gouty RN, BSN Signed: 03/04/2020 5:12:12 PM By: Linton Ham MD Entered By: Baruch Gouty on 03/04/2020 13:50:29 -------------------------------------------------------------------------------- Problem List Details Patient Name: Date of Service: [name] 03/04/2020 2:30 PM Medical Record Number: [id number] Patient Account Number: [id number] Date of Birth/Sex: Treating RN: 06-Nov-1970 (49 y.o. 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 limited to breakdown of 09/17/2019 No Yes skin E10.621 Type 1 diabetes mellitus with foot ulcer 09/24/2018 No Yes Inactive Problems ICD-10 Code Description Active Date Inactive Date L97.521 Non-pressure chronic ulcer of other part of left foot limited to breakdown of skin 04/20/2019 04/20/2019 L97.812 Non-pressure chronic ulcer of other part of right lower leg with fat layer exposed 02/24/2019 02/24/2019 Resolved  Problems ICD-10 Code Description Active Date Resolved Date L02.415 Cutaneous abscess of right lower limb 12/25/2018 12/25/2018 Electronic Signature(s) Signed: 03/04/2020 5:12:12 PM By: Linton Ham MD Entered By: Linton Ham on 03/04/2020 13:51:49 -------------------------------------------------------------------------------- Progress Note Details Patient Name: Date of Service: [place] [facility], [name] 03/04/2020 2:30 PM Medical Record Number: [id number] Patient Account Number: [id number] Date of Birth/Sex: Treating RN: 07-Nov-1970 (49 y.o. [name] Primary Care Provider: [name], [name] Other Clinician: Referring Provider: Treating Provider/Extender: [name], [name] Weeks in Treatment: 14 Subjective History of Present Illness (HPI) 49 year old diabetic who is known to have type 1 diabetes which is poorly controlled last hemoglobin A1c was 11%. She comes in with a ulcerated area on the left lateral foot which has been there for over 6 months. Was recently she has been treated by [name] of podiatry who saw her last on 05/28/2016. Review of his notes revealed that the patient had incision and drainage with placement of antibiotic beads to the left foot on 04/11/2016 for possible osteomyelitis of the cuboid bone. Over the last year she's had a history of amputation of the left fifth toe and a femoropopliteal popliteal bypass graft somewhere in April 2017. 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 His note [name] mentions that the patient has been referred to me for further wound care and possibly great candidate for hyperbaric oxygen therapy due to recurrent osteomyelitis. However we do not have any x-rays of biopsy reports confirming this. He has been on several antibiotics including Bactrim and most recently is on doxycycline for an MRSA. I understand, the patient was not a candidate for IV antibiotics as she has had previous PICC  lines which resulted in blood clots in both arms. There was a x-ray report dated 04/04/2016 on [name] notes which showed evidence of fifth ray resection  left foot with osteolytic changes noted to the fourth metatarsal and cuboid bone on the left. 06/13/2016 -- had a left foot x-ray which showed no acute fracture or dislocation and no definite radiographic evidence of osteomyelitis. Advanced osteopenia was seen. 06/20/2016 -- she has noticed a new wound on the right plantar foot in the region where she had a callus before. 06/27/16- the patient did have her x-ray of the right foot which showed no findings to suggest osteomyelitis. She saw her endocrinologist, [name], yesterday. Her A1c in January was 11. He also indicates mismanagement and noncompliance regarding her diabetes. She is currently on Bactrim for a lip infection. She is complaining of nausea, vomiting and diarrhea. She is unable to articulate the exact orders or dosing of the Bactrim; it is unclear when she will complete this. 07/04/2016 -- results from [facility] of ABIs with ankle waveforms were noted from 02/14/2016. The examination done on 06/27/2015 showed noncompressible ABIs with the right being 1.45 and the left being 1.33. The present examination showed a right ABI of 1.19 on the left of 1.33. The conclusion was that right normal ABI in the lower extremity at rest however compared to previous study which was noncompressible ABI may be falsely elevated side suggesting medial calcification. The left ABI suggested medial calcification. 08/01/2016 -- the patient had more redness and pain on her right foot and did not get to come to see as noted she see her PCP or go to the ER and decided to take some leftover metronidazole which she had at home. As usual, the patient does report she feels and is rather noncompliant. 08/08/2016 -- -- x-ray of the right foot -- FINDINGS:Transmetatarsal amputation is noted. No bony destruction is  noted to suggest osteomyelitis. IMPRESSION: No evidence of osteomyelitis. Postsurgical changes are seen. MRI would be more sensitive for possible bony changes. Culture has grown Serratia Marcescens -- sensitive to Bactrim, ciprofloxacin, ceftazidime she was seen by [name] on 08/06/2016. He did not find any exposed bone, muscle, tendon, ligament or joint. There was no malodor and he did a excisional debridement in the office. ============ Old notes: 49 year old patient who is known to the wound clinic for a while had been away from the wound clinic since 09/01/2014. Over the last several months she has been admitted to various hospitals including [facility] [place]. She was treated for a right metatarsal osteomyelitis with a transmetatarsal amputation and this was done about 2 months ago. He has a small ulcerated area on the right heel and she continues to have an ulcerated area on the left plantar aspect of the foot. The patient was recently admitted to the [facility] hospital group between 7/12 and 10/18/2014. she was given 3 weeks of IV vancomycin and was to follow-up with her surgeons at [facility] and also took oral vancomycin for C. difficile colitis. 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 MRI of the left foot done on 10/14/2014 showed no abscess or osteomyelitis. 04/27/15; this is a patient we know from previous stays in the wound care center. She is a type I diabetic I am not sure of her control currently. Since the last time I saw her she is had a right transmetatarsal amputation and has no wounds on her right foot  and has no open wounds. She is been followed at the wound care center at  [facility] in [place]. She comes today with the desire to undergo hyperbaric treatment locally. Apparently one of her wound care providers in [place] has suggested hyperbarics. This is in response to an MRI from 04/18/15 that showed increased marrow signal and loss of the proximal fifth metatarsal cortex evidence of osteomyelitis with likely early osteomyelitis in the cuboid bone as well. She has a large wound over the base of the fifth metatarsal. She also has a eschar over her the tips of her toes on 1,3 and 5. She does not have peripheral pulses and apparently is going for an angiogram tomorrow which seems reasonable. After this she is going to infectious disease at [facility]. They have been using Medihoney to the large wound on the lateral aspect of the left foot to. The patient has known Charcot deformity from diabetic neuropathy. She also has known diabetic PAD. Surprisingly I can't see that she has had any recent antibiotics, the patient states the last antibiotic she had was at the end of November for 10 days. I think this was in response to culture that showed group G strep although I'm not exactly sure where the culture was from. She is also had arterial studies on 03/29/15. This showed a right ABI of 1.4 that was noncompressible. Her left ABI was 0.73. 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 It was not felt that arterial inflow was adequate for healing of a foot ulcer. Her Doppler waveforms looked monophasic ===== READMISSION 02/28/17; this is in an now 49 year old woman we've had at several different occasions in this clinic. 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 She has a remote ex-smoker. She was last seen in this clinic by Dr. Con Memos I think in May. More recently she is been followed by her podiatrist [name] an infectious disease Dr. Megan Salon. She has 2 open wounds the major one is over the right first  metatarsal head she also has a wound on the left plantar foot. an MRI of the right foot on 01/01/17 showed a soft tissue ulcer along the plantar aspect of the first metatarsal base consistent with osteomyelitis of the first metatarsal stump. Dr. 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 According to the patient this is been open for slightly over a month. She has been on a combination of Cipro 500 twice a day, Zyvox 600 twice a day and Flagyl 500 3 times a day for over a month now as directed by Dr. Megan Salon. cultures of the right foot earlier this year showed MRSA in January and Serratia in May. January also had a few viridans strep. Recent x-rays of both feet were done and [name] office and I don't have these reports. The patient has known PAD and has a history of aleft femoropopliteal bypass in April 2017. 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 Dwyane Dee. In spite of this the last hemoglobin A1c I can see is 10.1 on 01/01/2017. She is being referred by 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 She is been using Medihoney to this area and also  an area on the left midfoot. She is using healing sandals bilaterally. ABIs in this clinic at the left posterior tibial was 1.1 noncompressible on the right READMISSION Non invasive vascular NOVANT 5/18 Aftercare following surgery of the circulatory system Procedure Note - Interface, External Ris In - 08/13/2016 11:05 AM EDT Procedure: Examination consists of physiologic resting arterial pressures of the brachial and ankle arteries bilaterally with continuous wave Doppler waveform analysis. Previous: Previous exam performed on 02/14/16  demonstrated ABIs of Rt = 1.19 and Lt = 1.33. Right: ABI = non-compressible PT 1.47 DP. S/P transmet amputation. , Left: ABI = 1.52, 2nd digit pressure = 87 mmHg Conclusions: Right: ABI (>1.3) may be falsely elevated, suggesting medial calcification. Left: ABI (>1.3) may be falsely elevated, suggesting medial calcification The patient is a now 49 year old type I diabetic is had multiple issues her graded to chronic diabetic foot ulcers. 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 We had her in the clinic lastin November. At that point she had wounds on her bilateral feet.she had wanted to try hyperbarics however the healogics review process denied her because she hadn't followed up with her vascular surgeon for her left femoropopliteal bypass. The bypass was done by Dr. Raul Del at [facility]. We made her a follow-up with Dr. Raul Del however she did not keep the appointment and therefore she was not approved The patient shows me a small wound on her left fourth metatarsal head on her phone. She developed rapid discoloration in the plantar aspect of the left foot and she was admitted to hospital from 2/2 through 05/10/17 with wet gangrene of the left foot osteomyelitis of the fourth metatarsal heads. She was admitted acutely ill with a temperature of 103. She was started on broad-spectrum vancomycin and cefepime. On 05/06/17 she was taken to the OR by [name] her podiatric surgeon for an incision and drainage irrigation of the left foot wound. Cultures from this surgery revealed group be strep and anaerobes. she was seen by Dr.Xu of orthopedic surgery and scheduled for a below-knee amputation which she u refused. Ultimately she was discharged on Levaquin and Flagyl for one month. MRI 05/05/17 done while she was in the hospital showed abscess adjacent to the fourth metatarsal head and neck small abscess around the fourth  flexor tendon. Inflammatory phlegmon and gas in the soft tissues along the lateral aspect of the fourth phalanx. Findings worrisome for osteomyelitis involving the fourth proximal and middle phalanx and also the third and fourth metatarsals. Finally the patient had actually shortly before this followed up with Dr. Raul Del at no time on 04/29/17. He felt that her left femoropopliteal bypass was patent he felt that her left-sided toe pressures more than adequate for healing a wound on the left foot. This was before her acute presentation. 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 05/28/17; she is started hyperbarics. The patient tells me that for some reason she was not actually on Levaquin but I think on ciprofloxacin. She was on Flagyl. She only started her Levaquin yesterday due to some difficulty with the pharmacy and perhaps her sister picking it up. She has an appointment with [name] tomorrow and with infectious disease early next week. She has no new complaints 06/06/17; the patient continues in hyperbarics. She saw [name] on 05/29/17 who is her podiatric surgeon. He is elected for a transmetatarsal amputation on 06/27/17. I'm not sure at what level he plans to do this amputation.  The patient is unaware ooShe also saw Dr. Megan Salon of infectious disease who elected to continue her on current antibiotics I think this is ciprofloxacin and Flagyl. I'll need to clarify with her tomorrow if she actually has this. We're using silver alginate to the actual wound. Necrotic surface today with material under the flap of her foot. ooOriginal MRI showed abscesses as well as osteomyelitis of the proximal and middle fourth phalanx and the third and fourth metatarsal heads 06/11/17; patient continues in hyperbarics and continues on oral antibiotics. She is doing well. The wound looks better. The necrotic part of this under the flap in her superior foot also looks better. she is been to see [name]. I  haven't had a chance to look at his note. Apparently he has put the transmetatarsal amputation on hold her request it is still planning to take her to the OR for debridement and product application ACEL. I'll see if I can find his note. I'll therefore leave product ordering/requests to [name] for now. I was going to look at Dermagraft 06/18/17-she is here in follow-up evaluation for bilateral foot wounds. She continues with hyperbaric therapy. She states she has been applying manuka honey to the right plantar foot and alternate manuka honey and silver alginate to the left foot, despite our orders. We will continue with same treatment plan and she will follo up next week. 06/25/17; I have reviewed [name] last note from 3/11. She has operative debridement in 2 days' time. By review his note apparently they're going to place there is skin over the majority of this wound which is a good choice. She has a small satellite area at the most proximal part of this wound on the left plantar foot. The area on the right plantar foot we've been using silver alginate and it is close to healing. 07/02/17; unfortunately the patient was not easily approved for [name] proposed surgery. I'm not completely certain what the issue is. She has been using silver alginate to the wound she has completed a first course of hyperbarics. She is still on Levaquin and Flagyl. I have really lost track of the time course here.I suspect she should have another week to 2 of antibiotics. I'll need to see if she is followed up with infectious disease Dr. Megan Salon 07/09/17; the patient is followed up with Dr. Megan Salon. 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 She continues on Levaquin and metronidazole continuing both of these for now I think she is been on fr about 6 weeks. She still has some drainage but no pain. No fever. Her had been plans for her to go to the OR for operative debridement  with her podiatrist [name], I am not exactly sure where that is. I'll probably slip a note to [name] today. I note that she follows with Dr. Dwyane Dee of endocrinology. We have her recertified for hyperbaric oxygen. I have not heard about Dermagraft however I'll see if [name] is planning a skin substitute as well 07/16/17; the patient tells me she is just about out of [place]. I'll need to check Dr. Hale Bogus last notes on this. She states she has plenty of Flagyl however. She comes in today complaining of pain in the right lateral foot which she said lasted for about a day. The wound on the right foot is actually much more medially. She also tells me that the [facility] cost a lot of pain  in the left foot wound and she turned back to silver alginate. Finally Dermagraft has a $496 per application co-pay. She cannot afford this 07/23/17; patient arrives today with the wound not much smaller. There is not much new to add. She has not heard from [name] all try to put in a call to them today. She was asking about Dermagraft again and she has an over $759 per application co-pay she states that she would be willing to try to do a payment plan. I been tried to avoid this. We've been using silver alginate, I'll change to [facility] 07/30/17-She is here in follow-up evaluation for left foot ulcer. She continues hyperbaric medicine. The left foot ulcer is stable we will continue with same treatment plan 08/06/17; she is here for evaluation of her left foot ulcer. Currently being treated for hyperbarics or underlying osteomyelitis. She is completed antibiotics. The left foot ulcer is better smaller with healthier looking granulation. For various reasons I am not really clear on we never got her back to the OR with [name]. He did not respond to my secure text message. Nevertheless I think that surgery on this point is not necessary nor am I completely clear that a skin substitute is necessary The  patient is complaining about pain on the outside of her right foot. 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 There is no erythema. She also states the foot is warm versus her other part of her upper leg and this is largely true. It is not totally clear to me what's causing this. She thinks it's different from her usual neuropathy pain 08/13/17; she arrives in clinic today with a small wound which is superficial on her right first metatarsal head. 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 She tells [place] she was up on her feet over the Mother's Day celebration. ooThe large wound is on the left foot. Continues with hyperbarics for underlying osteomyelitis. We're using Hydrofera Blue. She asked me today about where we were with Dermagraft. I had actually excluded this because of the co-pay however she wants to assume this therefore I'll recheck the co-pay an order for next week. 08/20/17; the patient agreed to accept the co-pay of the first Apligraf which we applied today. She is disappointed she is finishing hyperbarics will run this through the insurance on the extent of the foot infection and the extent of the wound that she had however she is already had 60 dive's. Dermagraft No. 1 08/27/17; Dermagraft No. 2. She is not eligible for any more hyperbaric treatments this month. She reports a fair amount of drainage and she actually changed to the external dressings without disturbing the direct contact layer 09/03/17; the patient arrived in clinic today with the wound superficially looking quite healthy. Nice vibrant red tissue with some advancing epithelialization although not as much adherence of the flap as I might like. However she noted on her own fourth toe some bogginess and she brought that to our attention. Indeed this was boggy feeling like a possibility of subcutaneous fluid. She stated that this was similar to how an issue came up on the lateral foot that led  to her fifth ray amputation. She is not been unwell. We've been using Dermagraft 09/10/17; the culture that I did not last week was MRSA. She saw Dr. Megan Salon this morning who is going to start her on vancomycin. I had sent him a secure a text message yesterday. I also spoke with her  podiatric surgeon [name] about surgery on this foot the options for conserving a functional foot etc. Promised me he would see her and will make back consultation today. Paradoxically her actual wound on the plantar aspect of her left foot looks really quite good. I had given her 5 days worth of Baxdella to cover her for MRSA. Her MRI came back showing osteomyelitis within the third metatarsal shaft and head and base of the third and fourth proximal phalanx. She had extensive inflammatory changes throughout the soft tissue of the lateral forefoot. With an ill-defined fluid around the fourth metatarsal extending into the plantar and dorsal soft tissues 09/19/17; the patient is actually on oral Septra and Flagyl. She apparently refused IV vancomycin. She also saw [name] at my request who is planning her for a left BKA sometime in mid July. MRI showed osteomyelitis within the third metatarsal shaft and head and the basis of the third and fourth proximal phalanx. I believe there was felt to be possible septic arthritis involving the third MTP. 09/26/17; the patient went back to Dr. Megan Salon at my suggestion and is now receiving IV daptomycin. Her wound continues to look quite good making the decision to proceed with a transmetatarsal amputation although more difficult for the patient. I believe in my extensive discussions with her she has a good sense of the pros and cons of this. I don't NV the tuft decision she has to make. She has an appointment with [name] I believe in mid July and I previously spoken to him about this issue Has we had used 3 previous Dermagraft. Given the condition of the wound surface I went  ahead and added the fourth one today area and I did this not fully realizing that she'll be traveling to [place] next week. I'm hopeful she can come back in 2 weeks 10/21/17; Her same Dermagraft on for about 3-1/2 weeks. In spite of this the wound arrives looking quite healthy. There is been a lot of healing dimensions are smaller. Looking at the square shaped wound she has now there is some undermining and some depth medially under the undermining although I cannot palpate any bone. No surrounding infection is obvious. She has difficult questions about how to look at this going forward vis--vis amputations versus continued medical therapy. T be truthful the wound is looks so o healthy and it is continued to contract. Hard to justify foot surgery at this point although I still told her that I think it might come to that if we are not able to eradicate the underlying MRSA. She is still highly at risk and she understands this 11/06/17 on evaluation today patient appears to be doing better in regard to her foot ulcer. She's been tolerating the dressing changes without complication. Currently she is here for her Dermagraft #6. Her wound continues to make excellent progress at this point. She does not appear to have any evidence of infection which is good news. 11/13/17 on evaluation today patient appears to be doing excellent at this time. She is here for repeat Dermagraft application. This is #7. Overall her wound seems to be making great progress. 12/05/17; the patient arrives with the wound in much better condition than when I last saw this almost 6 weeks ago. She still has a small probing area in the left metatarsal head region on the lateral aspect of her foot. We applied her last Dermagraft today. ooSince the last time she is here she has what appears to  a been a blood blister on the plantar aspect of left foot although I don't see this is threatening. There is also a thick raised tissue on the right  mid metatarsal head region. This was not there I don't think the last time she was here 3 weeks ago. 12/12/17; the patient continues to have a small programming area in the left metatarsal head region on the lateral aspect of her foot which was the initial large surgical wound. I applied her last Apligraf last week. I'm going to use Endoform starting today ooUnfortunately she has an excoriated area in the left mid foot and the right mid foot. The left midfoot looks like a blistered area this was not opened last week it certainly is open today. Using silver alginate on these areas. She promises me she is offloading this. 12/19/17; the small probing area in the left metatarsal head eyes think is shallower. In general her original wound looks better. We've been using Endoform. The area inferiorly that I think was trauma last week still requires debridement a lot of nonviable surface which I removed. She still has an open open area distally in her foot ooSimilarly on the right foot there is tightly adherent surface debris which I removed. Still areas that don't look completely epithelialized. This is a small open area. We used silver alginate on these areas 12/26/2017; the patient did not have the supplies we ordered from last week including the Endoform. The original large wound on the left lateral foot looks healthy. She still has the undermining area that is largely unchanged from last week. She has the same heavily callused raised edged wounds on the right mid and left midfoot. Both of these requiring debridement. We have been using silver alginate on these areas 01/02/2018; there is still supply issues. We are going to try to use Prisma but I am not sure she actually got it from what she is saying. She has a new open area on the lateral aspect of the left fourth toe [previous fifth ray amputation]. Still the one tunneling area over the fourth metatarsal head. The area is in the midfoot bilaterally  still have thick callus around them. She is concerned about a raised swelling on the lateral aspect of the foot. However she is completely insensate 01/10/2018; we are using Prisma to the wounds on her bilateral feet. Surprisingly the tunneling area over the left fourth metatarsal head that was part of her original surgery has closed down. She has a small open area remaining on the incision line. 2 open areas in the midfoot. 02/10/2018; the patient arrives back in clinic after a month hiatus. She was traveling to visit family in [place]. Is fairly clear she was not offloading the areas on her feet. The original wound over the left lateral foot at the level of metatarsal heads is reopened and probes medially by about a centimeter or 2. She notes that a week ago she had purulent drainage come out of an area on the left midfoot. Paradoxically the worst area is actually on the right foot is extensive with purulent drainage. We will use silver alginate today 02/17/2018; the patient has 3 wounds one over the left lateral foot. She still has a small area over the metatarsal heads which is the remnant of her original surgical wound. This has medial probing depth of roughly 1.4 cm somewhat better than last week. The area on the right foot is larger. We have been using silver alginate to all areas.  The area on the right foot and left foot that we cultured last week showed both Klebsiella and Proteus. Both of these are quinolone sensitive. The patient put her's self on Bactrim and Flagyl that she had left hanging around from prior antibiotic usages. She was apparently on this last week when she arrived. I did not realize this. Unfortunately the Bactrim will not cover either 1 of these organisms. We will send in Cipro 500 twice daily for a week 03/04/2018; the patient has 2 wounds on the left foot one is the original wound which was a surgical wound for a deep DFU. At one point this had exposed bone. She still  has an area over the fourth metatarsal head that probes about 1.4 cm although I think this is better than last week. I been using silver nitrate to try and promote tissue adherence and been using silver alginate here. ooShe also has an area in the left midfoot. This has some depth but a small linear wound. Still requiring debridement. ooOn the right midfoot is a circular wound. A lot of thick callus around this area. ooWe have been using silver alginate to all wound areas ooShe is completed the ciprofloxacin I gave her 2 weeks ago. 03/11/2018; the patient continues to have 2 open areas on the left foot 1 of which was the original surgical wound for a deep DFU. Only a small probing area remains although this is not much different from last week we have been using silver alginate. The other area is on the midfoot this is smaller linear but still with some depth. We have been using silver alginate here as well ooOn the right foot she has a small circular wound in the mid aspect. This is not much smaller than last time. We have been using silver alginate here as well 03/18/2018; she has 3 wounds on the left foot the original surgical wound, a very superficial wound in the mid aspect and then finally the area in the mid plantar foot. She arrives in today with a very concerning area in the wound in the mid plantar foot which is her most proximal wound. There is undermining here of roughly 1-1/2 cm superiorly. Serosanguineous drainage. She tells me she had some pain on for over the weekend that shot up her foot into her thigh and she tells me that she had a nodule in the groin area. ooShe has the single wound in the right foot. ooWe are using endoform to both wound areas 03/24/2018; the patient arrives with the original surgical wound in the area on the left midfoot about the same as last week. There is a collection of fluid under the surface of the skin extending from the surgical wound towards the  midfoot although it does not reach the midfoot wound. The area on the right foot is about the same. Cultures from last week of the left midfoot wound showed abundant Klebsiella abundant Enterococcus faecalis and moderate methicillin resistant staph I gave her Levaquin but this would have only covered the Klebsiella. She will need linezolid 04/01/2018; she is taking linezolid but for the first few days only took 1 a day. I have advised her to finish this at twice daily dosing. In any case all of her wounds are a lot better especially on the left foot. The original surgical wound is closed. The area on the left midfoot considerably smaller. The area on the right foot also smaller. 04/08/2018; her original surgical wound/osteomyelitis on the left foot  remains closed. She has area on the left foot that is in the midfoot area but she had some streaking towards this. This is not connected with her original wound at least not visually. ooSmall wound on the right midfoot appears somewhat smaller. 04/15/18; both wounds looks better. Original wound is better left midfoot. Using silver alginate 1/21; patient states she uses saltwater soak in, stones or remove callus from around her wounds. She is also concerned about a blood blister she had on the left foot but it simply resolved on its own. We've been using silver alginate 1/28; the patient arrives today with the same streaking area from her metatarsals laterally [the site of her original surgical wound] down to the middle of her foot. There is some drainage in the subcutaneous area here. This concerns me that there is actually continued ongoing infection in the metatarsals probably the fourth and third. This fixates an MRI of the foot without contrast [chronic renal failure] ooThe wound in the mid part of the foot is small but I wonder whether this area actually connects with the more distal foot. ooThe area on the right midfoot is probably about the same.  Callus thick skin around the small wound which I removed with a curette we have been using silver alginate on both wound areas 2/4; culture I did of the draining site on the left foot last time grew methicillin sensitive staph aureus. MRI of the left foot showed interval resolution of the findings surrounding the third metatarsal joint on the prior study consistent with treated osteomyelitis. Chronic soft tissue ulceration in the plantar and lateral aspect of the forefoot without residual focal fluid collection. No evidence of recurrent osteomyelitis. 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 She tells me she had nausea and vomiting over the weekend and missed 2 days. In general her foot looks much the same. She has a small open area just below the left fourth metatarsal head. A linear area in the left midfoot. Some discoloration extending from the inferior part of this into the left lateral foot although this appears to be superficial. She has a small area on the right midfoot which generally looks smaller after debridement 2/18; the patient is completing his cephalexin and has another 2 days. She continues to have open areas on the left and right foot. 2/25; she is now off antibiotics. The area on the left foot at the site of her original surgical wound has closed yet again. She still has open areas in the mid part of her foot however these appear smaller. The area on the right mid foot looks about the same. We have been using silver alginate She tells me she had a serious hypoglycemic spell at home. She had to have EMS called and get IV dextrose 3/3; disappointing on the left lateral foot large area of necrotic tissue surrounding the linear area. This  appears to track up towards the same original surgical wound. Required extensive debridement. The area on the right plantar foot is not a lot better also using silver 3/12; the culture I did last time showed abundant enterococcus. I have prescribed Augmentin, should cover any unrecognized anaerobes as well. In addition there were a few MRSA and Serratia that would not be  well covered although I did not want to give her multiple antibiotics. She comes in today with a new wound in the right midfoot this is not connected with the original wound over her MTP a lot of thick callus tissue around both wounds but once again she said she is not walking on these areas 3/17-Patient comes in for follow-up on the bilateral plantar wounds, the right midfoot and the left plantar wound. Both these are heavily callused surrounding the wounds. We are continuing to use silver alginate, she is compliant with offloading and states she uses a wheelchair fairly often at home 3/24; both wound areas have thick callus. However things actually look quite a bit better here for the majority of her left foot and the right foot. 3/31; patient continues to have thick callused somewhat irritated looking tissue around the wounds which individually are fairly superficial. There is no evidence of surrounding infection. We have been using silver alginate however I change that to [facility] today 4/17; patient returns to clinic after having a scare with Covid she tested negative in her primary doctor's office. She has been using Hydrofera Blue. She does not have an open area on the right foot. On the left foot she has a small open area with the mid area not completely viable. She showed me pictures of what looks like a hemorrhagic blister from several days ago but that seems to have healed over this was on the lateral left foot 4/21; patient comes in to clinic with both her wounds on her feet closed. However over the weekend she  started having pain in her right foot and leg up into the thigh. She felt as though she was running a low-grade fever but did not take her temperature. She took a doxycycline that she had leftover and yesterday a single Septra and metronidazole. She thinks things feel somewhat better. 4/28; duplex ultrasound I ordered last week was negative for DVT or superficial thrombophlebitis. She is completed the doxycycline I gave her. States she is still having a lot of pain in the right calf and right ankle which is no better than last week. She cannot sleep. She also states she has a temperature of up to 101, coughing and complaining of visual loss in her bilateral eyes. Apparently she was tested for Covid 2 weeks ago at [facility] and that was negative. Readmission: 09/03/18 patient presents back for reevaluation after having been evaluated at the end of April regarding erythema and swelling of her right lower extremity. Subsequently she ended up going to the hospital on 07/29/18 and was admitted not to be discharged until 08/08/18. Unfortunately it was noted during the time that she was in the hospital that she did have methicillin-resistant Staphylococcus aureus as the infection noted at the site. It was also determined that she did have osteomyelitis which appears to be fairly significant. She was treated with vancomycin and in fact is still on IV vancomycin at dialysis currently. This is actually slated to continue until 09/12/18 at least which will be the completion of the six weeks of therapy. Nonetheless based on what I'm seeing at this point I'm not sure she will be anywhere near ready to discontinue antibiotics at that time. Since she was released from the hospital she was seen by [name] who is her podiatrist on 08/27/18. His note specifically states that he is recommended that the patient needs of one knee amputation on the right as she has a life- threatening situation that  can lead quickly to sepsis. The  patient advised she would like to try to save her leg to which [name] apparently told her that this was against all medical advice. She also want to discontinue the Wound VAC which had been initiated due to the fact that she wasn't pleased with how the wound was looking and subsequently she wanted to pursue applying Medihoney at that time. He stated that he did not believe that the right lower extremity was salvageable and that the patient understood but would still like to attempt hyperbaric option therapy if it could be of any benefit. She was therefore referred back to [place] for further evaluation. He plans to see her back next week. Upon inspection today patient has a significant amount purulent drainage noted from the wound at this point. The bone in the distal portion of her foot also appears to be extremely necrotic and spongy. When I push down on the bone it bubbles and seeps purulent drainage from deeper in the end of the foot. I do not think that this is likely going to heal very well at all and less aggressive surgical debridement were undertaken more than what I believe we can likely do here in our office. 09/12/2018; I have not seen this patient since the most recent hospitalization although she was in our clinic last week. I have reviewed some of her records from a complex hospitalization. 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 There is situation was complicated by MRSA bacteremia and acute on chronic renal failure now on dialysis. She is receiving vancomycin at dialysis. We started her on Dakin's wet-to-dry last week she is changing this daily. There is still purulent drainage coming out of her foot. Although she is apparently "agreeable" to a below-knee amputation which is been suggested by multiple clinicians she wants this to be done in [place]. She apparently has a telehealth visit with that provider sometime in late [place] 6/24. I  have told her I think this is probably too long. Nevertheless I could not convince her to allow a local doctor to perform BKA. 09/19/2018; the patient has a large necrotic area on the right anterior foot. She has had previous transmetatarsal amputations. Culture I did last week showed MRSA nothing else she is on vancomycin at dialysis. She has continued leaking purulent drainage out of the distal part of the large circular wound on the right anterior foot. She apparently went to see Dr. 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 Somehow that translated into her being referred to plastic surgery for debridement of the area. I gather she basically refused amputation although I do not have a copy of Dr. Berenice Primas notes. The patient really wants to have a trial of hyperbaric oxygen. I agreed with initial assessment in this clinic that this was probably too far along to benefit however if she is going to have plastic surgery I think she would benefit from ancillary hyperbaric oxygen. The issue here is that the patient has benefited as maximally as any patient I have ever seen from hyperbaric oxygen therapy. Most recently she had exposed bone on the lateral part of her left foot after a surgical procedure and that actually has closed. She has eschared areas in both heels but no open area. She is remained systemically well. I am not optimistic that anything can be done about this but the patient is very clear that she wants an attempt.  The attempt would include a wound VAC further debridements and hyperbaric oxygen along with IV antibiotics. 6/26; I put her in for a trial of hyperbaric oxygen only because of the dramatic response she has had with wounds on her left midfoot earlier this year which was a surgical wound that went straight to her bone over the metatarsal heads and also remotely the left third toe. We will see if we can get this through our review process and insurance.  She arrives in clinic with again purulent material pouring out of necrotic bone on the top of the foot distally. There is also some concerning erythema on the front of the leg that we marked. It is bit difficult to tell how tender this is because of neuropathy. I note from infectious disease that she had her vancomycin extended. 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 She had the wound VAC on for part of the week. The rest of the time she is putting various things on this including Medihoney, "ionized water" silver sorb gel etc. 7/7; follow-up along with HBO. She is still on vancomycin at dialysis. She has a large open area on the dorsal right foot and a small dark eschar area on her heel. There is a lot less erythema in the area and a lot less tenderness. From an infection point of view I think this is better. She still has a lot of necrosis in the remaining right forefoot [previous TMA] we are still using the wound VAC in this area 7/16; follow-up along with HBO. I put her on linezolid after she finished her vancomycin. We started this last Friday I gave her 2 weeks worth. I had the expectation that she would be operatively debrided by Dr. Marla Roe but that still has not happened yet. Patient phoned the office this week. She arrives for review today after HBO. The distal part of this wound is completely necrotic. Nonviable pieces of tendon bone was still purulent drainage. Also concerning that she has black eschar over the heel that is expanding. I think this may be indicative of infection in this area as well. She has less erythema and warmth in the ankle and calf but still an abnormal exam 7/21 follow-up along with HBO. I will renew her linezolid after checking a CBC with differential monitoring her blood counts especially her platelets. She was supposed to have surgery yesterday but if I am reading things correctly this was canceled after her blood sugar was  found to be over 500. I thought Dr. Marla Roe who called me said that they were sending her to the ER but the patient states that was not the case. 7/28. Follow-up along with HBO. She is on linezolid I still do not have any lab work from dialysis even though I called last week. The patient is concerned about an area on her left lateral foot about the level of the base of her fifth metatarsal. I did not really see anything that ominous here however this patient is in [place] ability to point out problems that she is sensing and she has been accurate in the past Finally she received a call from Dr. Marla Roe who is referring her to another orthopedic surgeon stating that she is too booked up to take her to the operating room now. Was still using a wound VAC on the foot 8/3 -Follow-up after HBO, she is got another week of linezolid, she is to call ID for an appointment, x-rays of  both feet were reviewed, the left foot x-ray with third MTP joint osteo- Right foot x-ray widespread osteo-in the right midfoot Right ankle x-ray does not show any active evidence of infection 8/11-Patient is seen after HBO, the wounds on the right foot appear to be about the same, the heel wound had some necrotic base over tendon that was debrided with a curette 8/21; patient is seen after HBO. The patient's wound on her dorsal foot actually looks reasonably good and there is substantial amount of epithelialization however the open area distally still has a lot of necrotic debris partially bone. I cannot really get a good sense of just how deep this probes under the foot. She has been pressuring me this week to order medical maggots through a company in [place] for her. The problem I have is there is not a defined wound area here. On the positive side there is no purulence. She has been to see infectious disease she is still on Septra DS although I have not had a chance to review their notes 8/28; patient is seen in  conjunction with HBO. The wounds on her foot continued to improve including the right dorsal foot substantially the, the distal part of this wound and the area on the right heel. We have been using a wound VAC over this chronically. She is still on trimethoprim as directed by infectious disease 9/4; patient is seen in conjunction with HBO. Right dorsal foot wound substantially anteriorly is better however she continues to have a deep wound in the distal part of this that is not responding. We have been using silver collagen under border foam ooArea on the right plantar medial heel seems better. We have been using Hydrofera Blue 12/12/18 on evaluation today patient appears to be doing about the same with regard to her wound based on prior measurements. She does have some necrotic tissue noted on the lateral aspect of the wound that is going require a little bit of sharp debridement today. This includes what appears to be potentially either severely necrotic bone or tendon. Nonetheless other than that she does not appear to have any severe infection which is good news 9/18; it is been 2 weeks since I saw this wound. She is tolerating HBO well. Continued dramatic improvement in the area on the right dorsal foot. She still has a small wound on the heel that we have been using Hydrofera Blue. She continues with a wound VAC 9/24; patient has to be seen emergently today with a swelling on her right lateral lower leg. She says that she told Dr. Evette Doffing about this and also myself on a couple of occasions but I really have no recollection of this. She is not systemically unwell and her wound really looked good the last time I saw this. She showed this to providers at dialysis and she was able to verify that she was started on cephalexin today for 5 doses at dialysis. She dialyzes on Tuesday Thursday and Saturday. 10/2; patient is seen in conjunction with HBO. The area that is draining on the right anterior  medial tibia is more extensive. Copious amounts of serosanguineous drainage with some purulence. We are still using the wound VAC on the original wound then it is stable. Culture I did of the original IandD showed MRSA I contacted dialysis she is now on vancomycin with dialysis treatments. I asked them to run a month 10/9; patient seen in conjunction with HBO. She had a new spontaneous open area just  above the wound on the right medial tibia ankle. More swelling on the right medial tibia. Her wound on the foot looks about the same perhaps slightly better. There is no warmth spreading up her leg but no obvious erythema. her MRI of the foot and ankle and distal tib-fib is not booked for next Friday I discussed this with her in great detail over multiple days. it is likely she has spreading infection upper leg at least involving the distal 25% above the ankle. She knows that if I refer her to orthopedics for infectious disease they are going to recommend amputation and indeed I am not against this myself. We had a good trial at trying to heal the foot which is what she wanted along with antibiotics debridement and HBO however she clearly has spreading infection [probably staph aureus/MRSA]. Nevertheless she once again tells me she wants to wait the left of the MRI. She still makes comments about having her amputation done in [place]. 10/19; arrives today with significant swelling on the lateral right leg. Last culture I did showed Klebsiella. Multidrug-resistant. Cipro was intermediate sensitivity and that is what I have her on pending her MRI which apparently is going to be done on Thursday this week although this seems to be moving back and forth. She is not systemically unwell. We are using silver alginate on her major wound area on the right medial foot and the draining areas on the right lateral lower leg 10/26; MRI showed extensive abscess in the anterior compartment of the right leg also  widespread osteomyelitis involving osseous structures of the midfoot and portions of the hindfoot. Also suspicion for osteomyelitis anterior aspect of the distal medial malleolus. Culture I did of the purulence once again showed a multidrug-resistant Klebsiella. I have been in contact with nephrology late last week and she has been started on cefepime at dialysis to replace the vancomycin We sent a copy of her MRI report to Dr. Geroge Baseman in [place] who is an orthopedic surgeon. The patient takes great stock in his opinion on this. She says she will go to [place] to have her leg amputated if Dr. Geroge Baseman does not feel there is any salvage options. 11/2; she still is not talk to her orthopedic surgeon in [place]. Apparently he will call her at [phone] this afternoon. The quality of this is she has not allowed me to refer her anywhere. She has been told over and over that she needs this amputated but has not agreed to be referred. She tells me her blood sugar was 600 last night but she has not been febrile. 11/9; she never did got a call from the orthopedic surgeon in [place] therefore that is off the radar. We have arranged to get her see orthopedic surgery at [facility]. She still has a lot of draining purulence coming out of the new abscess in her right leg although that probably came from the osteomyelitis in her right foot and heel. Meanwhile the original wound on the right foot looks very healthy. Continued improvement. The issue is that the last MRI showed osteomyelitis in her right foot extensively she now has an abscess in the right anterior lower leg. There is nobody in [place] who will offer this woman anything but an amputation and to be honest that is probably what she needs. I think she still wants to talk about limb salvage although at this point I just do not see that. She has completed her vancomycin at dialysis which was for the  original staph aureus she is still on cefepime for  the more recent Klebsiella. She has had a long course of both of these antibiotics which should have benefited the osteomyelitis on the right foot as well as the abscess. 11/16; apparently [place] elective surgery is shut down because of COVID-19 pandemic. I have reached out to some contacts at [facility] to see if we can get her an orthopedic appointment there. I am concerned about continually leaving this but for the moment everything is static. In fact her original large wound on this foot is closing down. It is the abscess on the right anterior leg that continues to drain purulent serosanguineous material. She is not currently on any antibiotics however she had a prolonged course of vancomycin [1 month] as well as cefepime for a month 02/24/2019 on evaluation today patient appears to be doing better than the last time I saw her. This is not a patient that I typically see. With that being said I am covering for Dr. Dellia Nims this week and again compared to when I last saw her overall the wounds in particular seem to be doing significantly better which is good news. With that being said the patient tells me several disconcerting things. She has not been able to get in to see anyone for potential debridement in regard to her leg wounds although she tells me that she does not think it is necessary any longer because she is taking care of that herself. She noticed a string coming out of the lower wound on her leg over the last week. The patient states that she subsequently decided that we must of pack something in there and started pulling the string out and as it kept coming and coming she realized this was likely her tendon. With that being said she continued to remove as much of this as she could. She then I subsequently proceeded to using tubes of antibiotic ointment which she will stick down into the wound and then scored as much as she can until she sees it coming out of the other wound opening. She  states that in doing this she is actually made things better and there is less redness and irritation. With regard to her foot wound she does have some necrotic tendon and tissue noted in one small corner but again the actual wound itself seems to be doing better with good granulation in general compared to my last evaluation. 12/7; continued improvement in the wound on the substantial part of the right medial foot. Still a necrotic area inferiorly that required debridement but the rest of this looks very healthy and is contracting. She has 2 wounds on the right lateral leg which were her original drainage sites from her abscess but all of this looks a lot better as well. She has been using silver alginate after putting antibiotic biotic ointment in one wound and watching it come out the other. I have talked to her in some detail today. I had given her names of orthopedic surgeons at [facility] for second opinion on what to do about the right leg. I do not think the patient never called them. She has not been able to get a hold of the orthopedic surgeon in [place] that she had put a lot of faith in as being somebody would give her an opinion that she would trust. I talked to her today and said even if I could get her in to another orthopedic surgeon about the leg which she accept  an amputation and she said she would not therefore I am not going to press this issue for the moment 12/14; continued improvement in his substantial wound on the right medial foot. There is still a necrotic area inferiorly with tightly adherent necrotic debris which I have been working on debriding each time she is here. She does not have an orthopedic appointment. Since last time she was here I looked over her cultures which were essentially MRSA on the foot wound and gram-negative rods in the abscess on the anterior leg. 12/21; continued improvement in the area on the right medial foot. She is not up on this much and that  is probably a good thing since I do not know it could support continuous ambulation. She has a small area on the right lateral leg which were remanence of the IandD's I did because of the abscess. I think she should probably have prophylactic antibiotics I am going to have to look this over to see if we can make an intelligent decision here. In the meantime her major wound is come down nicely. Necrotic area inferiorly is still there but looks a lot better 04/06/2019; she has had some improvement in the overall surface area on the right medial foot somewhat narrowedr both but somewhat longer. The areas on the right lateral leg which were initial IandD sites are superficial. Nothing is present on the right heel. We are using silver alginate to the wound areas 1/18; right medial foot somewhat smaller. Still a deep probing area in the most distal recess of the wound. She has nothing open on the right leg. She has a new wound on the plantar aspect of her left fourth toe which may have come from just pulling skin. The patient using Medihoney on the wound on her foot under silver alginate. I cannot discourage her from this 2/1; 2-week follow-up using silver alginate on the right foot and her left fourth toe. The area on the right dorsal foot is contracted although there is still the deep area in the most distal part of the wound but still has some probing depth. No overt infection 2/15; 2-week follow-up. She continues to have improvement in the surface area on the dorsal right foot. Even the tunneling area from last time is almost closed. The area that was on the plantar part of her left fourth toe over the PIP is indeed closed 3/1; 2-week follow-up. Continued improvement in surface area. The original divot that we have been debriding inferiorly I think has full epithelialization although the epithelialization is gone down into the wound with probably 4 mm of depth. Even under intense illumination I am unable  to see anything open here. The remanence of the wound in this area actually look quite healthy. We have been using silver alginate 3/15; 2-week follow-up. Unfortunately not as good today. She has a comma shaped wound on the dorsal foot however the upper part of this is larger. Under illumination debris on the surface She also tells [place] that she was on her right leg 2 times in the last couple of weeks mostly to reach up for things above her head etc. She felt a sharp pain in the right leg which she thinks is somewhere from the ankle to the knee. The patient has neuropathy and is really uncertain. She cannot feel her foot so she does not think it was coming from there 3/29; 2-week follow-up. Her wound measures smaller. Surface of the wound appears reasonable. She is using  silver alginate with underlying Medihoney. She has home health. X-rays I did of her tib-fib last time were negative although it did show arterial calcification 4/12; 2-week follow-up. Her wound measures smaller in length. Using manuka honey with silver alginate on top. She has home health. 4/26; 2-week follow-up. Her wound is smaller but still very adherent debris under illumination requiring debridement she has been using manuka honey with silver alginate. 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 Been using Hydrofera Blue. She is on suppressive Bactrim but apparently she has been taking it twice daily 6/7; I have not seen this wound and about 6 weeks. Since then she was up in [place]. By her own admission she was walking on the foot because she did not have a wheelchair. The wound is not nearly as healthy looking as it was the last time I saw this. We ordered different things for her but she only uses Medihoney and silver alginate. As far as I know she is on suppressive trimethoprim sulfamethoxazole. She does not admit to any  fever or chills. Her CBGs apparently are at baseline however she is saying that she feels some discomfort on the lateral part of her ankle I looked over her last inflammatory markers from the summer 2020 at which time she had a deeply necrotic infected wound in this area. On 11/10/2018 her sedimentation rate was 56 and C-reactive protein 9.9. This was 107 and 29 on 07/29/2018. 6/17; the patient had a necrotic wound the last time she was here on the right dorsal foot. After debridement I did a culture. This showed a very resistant ESBL Klebsiella as well as Enterococcus. Her x-ray of the foot which was done because of warmth and some discomfort showed bone destruction within the carpal bones involving the navicular acute cuboid lateral middle cuneiforms but essentially unchanged from her prior study which was done on 10/29/2018. The findings were felt to represent chronic osteomyelitis. We did inflammatory markers on her. Her white count was 5.25 sedimentation rate 16 and C-reactive protein at 11.1. Notable for the fact that in August 2020 her CRP was 9.9 and sedimentation rate 56. I have looked at her x-rays. It is true that the bone destruction is very impressive however the patient came into this clinic for the wound on her right foot with pieces of bone literally falling out anteriorly with purulent material. I am not exactly sure I could have expected anything different. She has not been systemically unwell no fever chills or blood sugars have been reasonable. 6/28; she arrives with a right heel closed. The substantial area on the right anterior foot looks healthy. Much better looking surface. I think we can change to [facility] seems to help this previously. She is getting her antibiotics at dialysis she should be just about finished 7/9; changed to [facility] last week. Surface wound looks satisfactory not much change in surface area however. She is going to [place] state next week this  is usually a difficult thing for this patient follow-up will be for 2 weeks. 7/23; using Hydrofera Blue. She returns from her trip and the wound looks surprisingly good. Usually when this patient goes on trips she comes back with a lot of problems with the wound. She is saying that she sometimes feels an episodic "crunching" feeling on the lateral part of the foot. She is neuropathic and not  feeling pain but wonders whether this could be a neuropathic dysesthesia. 11/13/19-Patient returns after 3 weeks, the wound itself is stable and patient states that there is nothing new going on she is on some extra anxiety medications and is resisting the temptation to pick at the dry skin around the wound. 9/20; patient has not been here in over a month and I have not seen her in 2 months. The wound in terms of size I think is about the same. There is no exposed bone. She has a nonviable surface on this. She is supposed to be using [facility] however she is also been using some form of honey preparation as well as a silver-based dressings. I do not think she has any pattern to this. 10/4; 2-week follow-up. Patient has been using some form of spray which she says has honey and silver to purchase this online she has been covering it with gauze. In spite of this the wound actually looks quite good. The deeper divot distally appears to be close down. There is a rim of epithelialization. 10/18; 2-week follow-up. Patient has been using her Hydrofera Blue covered with her silver honey spray that she got online. 11/1; 2-week follow-up. She is using Hydrofera Blue with a silver honey spray. Wound bed is measuring smaller. She has noticed that her foot is warmer on the right. She is concerned about infection. For a long period of time I had her on prophylactic trimethoprim sulfamethoxazole DS 1 tablet daily. She is asking for this to be restarted. The patient is walking on this foot because of repairs that are being  done in a home her but her room is on the second floor she has to go up and down stairs. I have cautioned against this however as usual she will do exactly what she wants to do 11/15; 2-week follow-up. She uses Hydrofera Blue with a silver/honey spray which I have never heard of. I think her wound looks about the same. Some epithelialization. No evidence that this is infected. I think she is walking on this more than we agreed on. She is going on extensive vacation over Thanksgiving 12/3; 2-week follow-up. She is using Hydrofera Blue however over Thanksgiving she ran out of this and she is simply been using Medihoney. In spite of this her wound is smaller almost divided into 2 now. She traveled extensively over Thanksgiving and actually looks quite good in spite of this. Usually this is been a marker of problems for her Objective Constitutional Vitals Time Taken: 1:34 PM, Height: 67 in, Weight: 125 lbs, BMI: 19.6, Temperature: 98.5 F, Pulse: 92 bpm, Respiratory Rate: 18 breaths/min, Blood Pressure: 125/77 mmHg. Respiratory work of breathing is normal. Cardiovascular Pedal pulses are palpable of both the dorsalis pedis and posterior tibial on the right. General Notes: Wound exam; the wound is actually smaller. Reasonably healthy looking surface although under illumination there is still some surface debris but I did not debride this. No erythema no tenderness and no warmth Integumentary (Hair, Skin) Wound #43 status is Open. Original cause of wound was Gradually Appeared. The wound is located on the Right,Medial Foot. The wound measures 3cm length x 2.5cm width x 0.1cm depth; 5.89cm^2 area and 0.589cm^3 volume. There is Fat Layer (Subcutaneous Tissue) exposed. There is no tunneling or undermining noted. There is a medium amount of serosanguineous drainage noted. The wound margin is flat and intact. There is medium (34-66%) pink granulation within the wound bed. There is a medium (34-66%)  amount  of necrotic tissue within the wound bed including Adherent Slough. 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 Bathing/ Shower/ Hygiene: May shower and wash wound with soap and water. Edema Control - Lymphedema / SCD / Other: Elevate legs to the level of the heart or above for 30 minutes daily and/or when sitting, a frequency of: Avoid standing for long periods of time. Moisturize legs daily. - with dressing changes Off-Loading: Other: - minimal weight bearing right foot Home Health: No change in wound care orders this week; continue Home Health for wound care. May utilize formulary equivalent dressing for wound treatment orders unless otherwise specified. Other Home Health Orders/Instructions: - Interim WOUND #43: - Foot Wound Laterality: Right, Medial Cleanser: Normal Saline (Home Health) Every Other Day/30 Days Discharge Instructions: Cleanse the wound with Normal Saline prior to applying a clean dressing using gauze sponges, not tissue or cotton balls. Cleanser: Wound Cleanser [facility]) Every Other Day/30 Days Discharge Instructions: Cleanse the wound with wound cleanser prior to applying a clean dressing using gauze sponges, not tissue or cotton balls. Prim Dressing: Hydrofera Blue Ready Foam, 4x5 in [facility]) Every Other Day/30 Days ary Discharge Instructions: Apply to wound bed as instructed Secondary Dressing: Woven Gauze Sponge, Non-Sterile 4x4 in [facility]) Every Other Day/30 Days Discharge Instructions: Apply over primary dressing as directed. Secured With: Elastic Bandage 4 inch (ACE bandage) (Home Health) Every Other Day/30 Days Discharge Instructions:  Secure with ACE bandage as directed. Secured With: [employer], 4.5x3.1 (in/yd) [facility]) Every Other Day/30 Days Discharge Instructions: Secure with Kerlix as directed. Secured With: Paper T ape, 2x10 (in/yd) (Home Health) Every Other Day/30 Days Discharge Instructions: Secure dressing with tape as directed. #1 I change her back to [facility]. She seems to have done well with this over time 2. As she does frequently in these visit she brings up the idea that she actually has a fear of her bones breaking. She tells me she only walks upstairs. I told her that I have no doubt she has chronic osteomyelitis in a large part of her forefoot. That she has healed these wounds are almost healed these wounds at all given the extent of them in the initial presentation is truly miraculous. She refused consideration of amputation in spite of the recommendations of multiple doctors including myself at some points. 3. I told her I felt that she would eventually have a right BKA however she has been healing this foot for about 2 years and making continued gradual progress. I see no absolute need to consider an amputation now. Electronic Signature(s) Signed: 03/04/2020 5:12:12 PM By: Linton Ham MD Entered By: Linton Ham on 03/04/2020 13:56:05 -------------------------------------------------------------------------------- SuperBill Details Patient Name: Date of Service: [name] 03/04/2020 Medical Record Number: [id number] Patient Account Number: [id number] Date of Birth/Sex: Treating RN: January 12, 1971 (49 y.o. [name] Primary Care Provider: [name], [name] Other Clinician: Referring Provider: Treating Provider/Extender: [name], [name] Weeks in Treatment: 78 Diagnosis Coding ICD-10 Codes Code Description [phone] Other chronic osteomyelitis, right ankle and foot L97.514 Non-pressure chronic ulcer of other part of right foot with  necrosis of bone L97.411 Non-pressure chronic ulcer of right heel and midfoot  limited to breakdown of skin E10.621 Type 1 diabetes mellitus with foot ulcer Facility Procedures CPT4 Code: [id number] Description: 31121 - WOUND CARE VISIT-LEV 3 EST PT Modifier: Quantity: 1 Physician Procedures : CPT4 Code Description Modifier [id number] - WC PHYS LEVEL 3 - EST PT ICD-10 Diagnosis Description L97.514 Non-pressure chronic ulcer of other part of right foot with necrosis of bone L97.411 Non-pressure chronic ulcer of right heel and midfoot  limited to breakdown of skin Quantity: 1 Electronic Signature(s) Signed: 03/04/2020 5:12:12 PM By: Linton Ham MD Entered By: Linton Ham on 03/04/2020 13:56:32

## 2020-03-07 NOTE — Progress Notes (Signed)
HAYLE, PARISI ([id number]) Visit Report for 03/04/2020 Arrival Information Details Patient Name: Date of Service: [name] 03/04/2020 2:30 PM Medical Record Number: [id number] Patient Account Number: [id number] Date of Birth/Sex: Treating RN: September 29, 1970 (49 y.o. [name], [name] Primary Care Greogory Cornette: [name], [name] Other Clinician: Referring Jd Mccaster: Treating Eilan Mcinerny/Extender: [name], [name] Weeks in Treatment: 26 Visit Information History Since Last Visit Added or deleted any medications: No Patient Arrived: Wheel Chair Any new allergies or adverse reactions: No Arrival Time: 13:33 Had a fall or experienced change in No Accompanied By: self activities of daily living that may affect Transfer Assistance: None risk of falls: Patient Identification Verified: Yes Signs or symptoms of abuse/neglect since last visito No Secondary Verification Process Completed: Yes Hospitalized since last visit: No Patient Requires Transmission-Based Precautions: No Implantable device outside of the clinic excluding No Patient Has Alerts: No cellular tissue based products placed in the center since last visit: Has Dressing in Place as Prescribed: Yes Pain Present Now: No Electronic Signature(s) Signed: 03/07/2020 5:19:12 PM By: Rhae Hammock RN Entered By: Rhae Hammock on 03/04/2020 13:33:45 -------------------------------------------------------------------------------- Clinic Level of Care Assessment Details Patient Name: Date of Service: [name] 03/04/2020 2:30 PM Medical Record Number: [id number] Patient Account Number: [id number] Date of Birth/Sex: Treating RN: Mar 11, 1971 (49 y.o. [name] Primary Care Gracilyn Gunia: [name], [name] Other Clinician: Referring Kenita Bines: Treating Kamareon Sciandra/Extender: [name], [name] Weeks in Treatment: 55 Clinic Level of Care Assessment Items TOOL 4 Quantity  Score []  - 0 Use when only an EandM is performed on FOLLOW-UP visit ASSESSMENTS - Nursing Assessment / Reassessment X- 1 10 Reassessment of Co-morbidities (includes updates in patient status) X- 1 5 Reassessment of Adherence to Treatment Plan ASSESSMENTS - Wound and Skin A ssessment / Reassessment X - Simple Wound Assessment / Reassessment - one wound 1 5 []  - 0 Complex Wound Assessment / Reassessment - multiple wounds []  - 0 Dermatologic / Skin Assessment (not related to wound area) ASSESSMENTS - Focused Assessment []  - 0 Circumferential Edema Measurements - multi extremities []  - 0 Nutritional Assessment / Counseling / Intervention X- 1 5 Lower Extremity Assessment (monofilament, tuning fork, pulses) []  - 0 Peripheral Arterial Disease Assessment (using hand held doppler) ASSESSMENTS - Ostomy and/or Continence Assessment and Care []  - 0 Incontinence Assessment and Management []  - 0 Ostomy Care Assessment and Management (repouching, etc.) PROCESS - Coordination of Care X - Simple Patient / Family Education for ongoing care 1 15 []  - 0 Complex (extensive) Patient / Family Education for ongoing care X- 1 10 Staff obtains Programmer, systems, Records, T Results / Process Orders est X- 1 10 Staff telephones HHA, Nursing Homes / Clarify orders / etc []  - 0 Routine Transfer to another Facility (non-emergent condition) []  - 0 Routine Hospital Admission (non-emergent condition) []  - 0 New Admissions / Biomedical engineer / Ordering NPWT Apligraf, etc. , []  - 0 Emergency Hospital Admission (emergent condition) X- 1 10 Simple Discharge Coordination []  - 0 Complex (extensive) Discharge Coordination PROCESS - Special Needs []  - 0 Pediatric / Minor Patient Management []  - 0 Isolation Patient Management []  - 0 Hearing / Language / Visual special needs []  - 0 Assessment of Community assistance (transportation, D/C planning, etc.) []  - 0 Additional assistance / Altered  mentation []  - 0 Support Surface(s) Assessment (bed, cushion, seat, etc.) INTERVENTIONS - Wound Cleansing / Measurement X - Simple Wound Cleansing -  one wound 1 5 []  - 0 Complex Wound Cleansing - multiple wounds X- 1 5 Wound Imaging (photographs - any number of wounds) []  - 0 Wound Tracing (instead of photographs) X- 1 5 Simple Wound Measurement - one wound []  - 0 Complex Wound Measurement - multiple wounds INTERVENTIONS - Wound Dressings X - Small Wound Dressing one or multiple wounds 1 10 []  - 0 Medium Wound Dressing one or multiple wounds []  - 0 Large Wound Dressing one or multiple wounds X- 1 5 Application of Medications - topical []  - 0 Application of Medications - injection INTERVENTIONS - Miscellaneous []  - 0 External ear exam []  - 0 Specimen Collection (cultures, biopsies, blood, body fluids, etc.) []  - 0 Specimen(s) / Culture(s) sent or taken to Lab for analysis []  - 0 Patient Transfer (multiple staff / Civil Service fast streamer / Similar devices) []  - 0 Simple Staple / Suture removal (25 or less) []  - 0 Complex Staple / Suture removal (26 or more) []  - 0 Hypo / Hyperglycemic Management (close monitor of Blood Glucose) []  - 0 Ankle / Brachial Index (ABI) - do not check if billed separately X- 1 5 Vital Signs Has the patient been seen at the hospital within the last three years: Yes Total Score: 105 Level Of Care: New/Established - Level 3 Electronic Signature(s) Signed: 03/04/2020 5:01:53 PM By: Baruch Gouty RN, BSN Entered By: Baruch Gouty on 03/04/2020 13:52:00 -------------------------------------------------------------------------------- Encounter Discharge Information Details Patient Name: Date of Service: [name] 03/04/2020 2:30 PM Medical Record Number: [id number] Patient Account Number: [id number] Date of Birth/Sex: Treating RN: [birth year] (49 y.o. [name] Primary Care Tal Neer: [name], [name] Other Clinician: Referring  Shannon Kirkendall: Treating Brinley Treanor/Extender: [name], [name] Weeks in Treatment: 49 Encounter Discharge Information Items Discharge Condition: Stable Ambulatory Status: Wheelchair Discharge Destination: Home Transportation: Private Auto Accompanied By: self Schedule Follow-up Appointment: Yes Clinical Summary of Care: Electronic Signature(s) Signed: 03/04/2020 5:19:29 PM By: Deon Pilling Entered By: Deon Pilling on 03/04/2020 13:59:30 -------------------------------------------------------------------------------- Lower Extremity Assessment Details Patient Name: Date of Service: [name] 03/04/2020 2:30 PM Medical Record Number: [id number] Patient Account Number: [id number] Date of Birth/Sex: Treating RN: 05-10-1970 (49 y.o. [name], [name] Primary Care Clell Trahan: [name], [name] Other Clinician: Referring Shaleen Talamantez: Treating Royden Bulman/Extender: [name], [name] Weeks in Treatment: 78 Edema Assessment Assessed: [Left: No] [Right: Yes] Edema: [Left: N] [Right: o] Calf Left: Right: Point of Measurement: From Medial Instep 27.55 cm Ankle Left: Right: Point of Measurement: From Medial Instep 19 cm Knee To Floor Left: Right: From Medial Instep 39.5 cm Vascular Assessment Pulses: Dorsalis Pedis Palpable: [Right:Yes] Posterior Tibial Palpable: [Right:Yes] Electronic Signature(s) Signed: 03/07/2020 5:19:12 PM By: Rhae Hammock RN Entered By: Rhae Hammock on 03/04/2020 13:35:15 -------------------------------------------------------------------------------- Multi Wound Chart Details Patient Name: Date of Service: [name] 03/04/2020 2:30 PM Medical Record Number: [id number] Patient Account Number: [id number] Date of Birth/Sex: Treating RN: 15-Apr-1970 (49 y.o. [name] Primary Care Kay Shippy: [name], [name] Other Clinician: Referring Fadia Marlar: Treating Danah Reinecke/Extender: [name], [name] Weeks in Treatment: 60 Vital Signs Height(in): 59 Pulse(bpm): 60 Weight(lbs): 125 Blood Pressure(mmHg): 125/77 Body Mass Index(BMI): 20 Temperature(F): 98.5 Respiratory Rate(breaths/min): 18 Photos: [43:No Photos Right, Medial Foot] [N/A:N/A N/A] Wound Location: [43:Gradually Appeared] [N/A:N/A] Wounding Event: [43:Diabetic Wound/Ulcer of the Lower] [N/A:N/A] Primary Etiology: [43:Extremity Cataracts, Chronic sinus] [N/A:N/A] Comorbid History: [43:problems/congestion, Anemia, Sleep Apnea,  Deep Vein Thrombosis, Hypertension, Peripheral Arterial Disease, Type I Diabetes, Osteoarthritis, Osteomyelitis, Neuropathy, Seizure Disorder 08/04/2018] [N/A:N/A] Date Acquired: [43:78] [N/A:N/A] Weeks of Treatment: [43:Open] [N/A:N/A] Wound Status: [43:Yes] [N/A:N/A] Clustered Wound: [43:3x2.5x0.1] [N/A:N/A] Measurements L x W x D (cm) [43:5.89] [N/A:N/A] A (cm) : rea [43:0.589] [N/A:N/A] Volume (cm) : [43:91.90%] [N/A:N/A] % Reduction in A rea: [43:97.30%] [N/A:N/A] % Reduction in Volume: [43:Grade 4] [N/A:N/A] Classification: [43:Medium] [N/A:N/A] Exudate A mount: [43:Serosanguineous] [N/A:N/A] Exudate Type: [43:red, brown] [N/A:N/A] Exudate Color: [43:Flat and Intact] [N/A:N/A] Wound Margin: [43:Medium (34-66%)] [N/A:N/A] Granulation A mount: [43:Pink] [N/A:N/A] Granulation Quality: [43:Medium (34-66%)] [N/A:N/A] Necrotic A mount: [43:Fat Layer (Subcutaneous Tissue): Yes N/A] Exposed Structures: [43:Fascia: No Tendon: No Muscle: No Joint: No Bone: No Small (1-33%)] [N/A:N/A] Epithelialization: Treatment Notes Electronic Signature(s) Signed: 03/04/2020 5:01:53 PM By: Baruch Gouty RN, BSN Signed: 03/04/2020 5:12:12 PM By: Linton Ham MD Entered By: Linton Ham on 03/04/2020 13:52:02 -------------------------------------------------------------------------------- Multi-Disciplinary Care Plan Details Patient Name: Date of Service: [place] LLO [address] [name]  03/04/2020 2:30 PM Medical Record Number: [id number] Patient Account Number: [id number] Date of Birth/Sex: Treating RN: 1970-10-28 (49 y.o. [name] Primary Care Julius Boniface: [name], [name] Other Clinician: Referring Codee Tutson: Treating Nawaf Strange/Extender: [name], [name] Weeks in Treatment: 94 Active Inactive Wound/Skin Impairment Nursing Diagnoses: Impaired tissue integrity Knowledge deficit related to ulceration/compromised skin integrity Goals: Patient/caregiver will verbalize understanding of skin care regimen Date Initiated: 09/03/2018 Target Resolution Date: 03/18/2020 Goal Status: Active Ulcer/skin breakdown will have a volume reduction of 30% by week 4 Date Initiated: 09/03/2018 Date Inactivated: 10/07/2018 Target Resolution Date: 10/01/2018 Goal Status: Unmet Unmet Reason: COMORBITIES Ulcer/skin breakdown will have a volume reduction of 50% by week 8 Date Initiated: 10/07/2018 Date Inactivated: 11/03/2018 Target Resolution Date: 10/31/2018 Goal Status: Unmet Unmet Reason: Osteomyelitis Interventions: Assess patient/caregiver ability to obtain necessary supplies Assess patient/caregiver ability to perform ulcer/skin care regimen upon admission and as needed Assess ulceration(s) every visit Provide education on ulcer and skin care Treatment Activities: Skin care regimen initiated : 09/03/2018 Topical wound management initiated : 09/03/2018 Notes: Electronic Signature(s) Signed: 03/04/2020 5:01:53 PM By: Baruch Gouty RN, BSN Entered By: Baruch Gouty on 03/04/2020 13:40:44 -------------------------------------------------------------------------------- Pain Assessment Details Patient Name: Date of Service: [name] 03/04/2020 2:30 PM Medical Record Number: [id number] Patient Account Number: [id number] Date of Birth/Sex: Treating RN: 31-Jan-1971 (49 y.o. [name], [name] Primary Care Harshini Trent: [name], [name] Other  Clinician: Referring Michaiah Maiden: Treating Briyah Wheelwright/Extender: [name], [name] Weeks in Treatment: 1 Active Problems Location of Pain Severity and Description of Pain Patient Has Paino No Site Locations Rate the pain. Current Pain Level: 0 Pain Management and Medication Current Pain Management: Electronic Signature(s) Signed: 03/07/2020 5:19:12 PM By: Rhae Hammock RN Entered By: Rhae Hammock on 03/04/2020 13:34:27 -------------------------------------------------------------------------------- Patient/Caregiver Education Details Patient Name: Date of Service: [name] 12/3/2021andnbsp2:30 PM Medical Record Number: [id number] Patient Account Number: [id number] Date of Birth/Gender: Treating RN: 05-04-1970 (49 y.o. [name] Primary Care Physician: [name], [name] Other Clinician: Referring Physician: Treating Physician/Extender: [name], [name] Weeks in Treatment: 18 Education Assessment Education Provided To: Patient Education Topics Provided Wound/Skin Impairment: Methods: Explain/Verbal Responses: Reinforcements needed, State content correctly Electronic Signature(s) Signed: 03/04/2020 5:01:53 PM By: Baruch Gouty RN, BSN Entered By: Baruch Gouty on 03/04/2020 13:41:03 -------------------------------------------------------------------------------- Wound Assessment Details Patient Name: Date of Service: [place] LLO WA Y, [name] 03/04/2020 2:30 PM Medical  Record Number: [id number] Patient Account Number: [id number] Date of Birth/Sex: Treating RN: 10/16/1970 (49 y.o. [name], [name] Primary Care Jacilyn Sanpedro: [name], [name] Other Clinician: Referring Bhavya Eschete: Treating Chiamaka Latka/Extender: [name], [name] Weeks in Treatment: 78 Wound Status Wound Number: [id number] Primary Diabetic Wound/Ulcer of the Lower Extremity Etiology: Wound Location: Right, Medial Foot Wound Open Wounding  Event: Gradually Appeared Status: Date Acquired: 08/04/2018 Comorbid Cataracts, Chronic sinus problems/congestion, Anemia, Sleep Weeks Of Treatment: 78 History: Apnea, Deep Vein Thrombosis, Hypertension, Peripheral Arterial Clustered Wound: Yes Disease, Type I Diabetes, Osteoarthritis, Osteomyelitis, Neuropathy, Seizure Disorder Wound Measurements Length: (cm) 3 Width: (cm) 2.5 Depth: (cm) 0.1 Area: (cm) 5.89 Volume: (cm) 0.589 % Reduction in Area: 91.9% % Reduction in Volume: 97.3% Epithelialization: Small (1-33%) Tunneling: No Undermining: No Wound Description Classification: Grade 4 Wound Margin: Flat and Intact Exudate Amount: Medium Exudate Type: Serosanguineous Exudate Color: red, brown Foul Odor After Cleansing: No Slough/Fibrino Yes Wound Bed Granulation Amount: Medium (34-66%) Exposed Structure Granulation Quality: Pink Fascia Exposed: No Necrotic Amount: Medium (34-66%) Fat Layer (Subcutaneous Tissue) Exposed: Yes Necrotic Quality: Adherent Slough Tendon Exposed: No Muscle Exposed: No Joint Exposed: No Bone Exposed: No Treatment Notes Wound #43 (Foot) Wound Laterality: Right, Medial Cleanser Normal Saline Discharge Instruction: Cleanse the wound with Normal Saline prior to applying a clean dressing using gauze sponges, not tissue or cotton balls. Wound Cleanser Discharge Instruction: Cleanse the wound with wound cleanser prior to applying a clean dressing using gauze sponges, not tissue or cotton balls. Peri-Wound Care Topical Primary Dressing Hydrofera Blue Ready Foam, 4x5 in Discharge Instruction: Apply to wound bed as instructed Secondary Dressing Woven Gauze Sponge, Non-Sterile 4x4 in Discharge Instruction: Apply over primary dressing as directed. Secured With Elastic Bandage 4 inch (ACE bandage) Discharge Instruction: Secure with ACE bandage as directed. Paper Tape, 2x10 (in/yd) Discharge Instruction: Secure dressing with tape as  directed. Compression Wrap Compression Stockings Add-Ons Electronic Signature(s) Signed: 03/07/2020 5:19:12 PM By: Rhae Hammock RN Entered By: Rhae Hammock on 03/04/2020 13:36:05 -------------------------------------------------------------------------------- Vitals Details Patient Name: Date of Service: [name] 03/04/2020 2:30 PM Medical Record Number: [id number] Patient Account Number: [id number] Date of Birth/Sex: Treating RN: 24-Feb-1971 (49 y.o. [name], [name] Primary Care Everlyn Farabaugh: [name], [name] Other Clinician: Referring Janai Maudlin: Treating Domenique Southers/Extender: [name], [name] Weeks in Treatment: 65 Vital Signs Time Taken: 13:34 Temperature (F): 98.5 Height (in): 67 Pulse (bpm): 92 Weight (lbs): 125 Respiratory Rate (breaths/min): 18 Body Mass Index (BMI): 19.6 Blood Pressure (mmHg): 125/77 Reference Range: 80 - 120 mg / dl Electronic Signature(s) Signed: 03/07/2020 5:19:12 PM By: Rhae Hammock RN Entered By: Rhae Hammock on 03/04/2020 13:34:16

## 2020-03-18 ENCOUNTER — Encounter (HOSPITAL_BASED_OUTPATIENT_CLINIC_OR_DEPARTMENT_OTHER): Payer: Medicare HMO | Admitting: Internal Medicine

## 2020-03-18 ENCOUNTER — Other Ambulatory Visit: Payer: Self-pay

## 2020-03-18 DIAGNOSIS — E10621 Type 1 diabetes mellitus with foot ulcer: Secondary | ICD-10-CM | POA: Diagnosis not present

## 2020-03-18 NOTE — Progress Notes (Signed)
DESHANA, ROMINGER ([id number]) Visit Report for 03/18/2020 Debridement Details Patient Name: Date of Service: [name] 03/18/2020 11:00 A M Medical Record Number: [id number] Patient Account Number: [id number] Date of Birth/Sex: Treating RN: 20-Nov-1970 (49 y.o. Nancy Fetter Primary Care Provider: [name], [name] Other Clinician: Referring Provider: Treating Provider/Extender: [name], [name] Weeks in Treatment: 42 Debridement Performed for Assessment: Wound #43 Right,Medial Foot Performed By: Physician Ricard Dillon., MD Debridement Type: Debridement Severity of Tissue Pre Debridement: Fat layer exposed Level of Consciousness (Pre-procedure): Awake and Alert Pre-procedure Verification/Time Out Yes - 12:24 Taken: Start Time: 12:24 Pain Control: Other : Benzocaine 20% T Area Debrided (L x W): otal 4 (cm) x 2 (cm) = 8 (cm) Tissue and other material debrided: Viable, Non-Viable, Slough, Subcutaneous, Slough Level: Skin/Subcutaneous Tissue Debridement Description: Excisional Instrument: Curette Bleeding: Minimum Hemostasis Achieved: Pressure End Time: 12:25 Procedural Pain: 0 Post Procedural Pain: 0 Response to Treatment: Procedure was tolerated well Level of Consciousness (Post- Awake and Alert procedure): Post Debridement Measurements of Total Wound Length: (cm) 4 Width: (cm) 2 Depth: (cm) 0.1 Volume: (cm) 0.628 Character of Wound/Ulcer Post Debridement: Improved Severity of Tissue Post Debridement: Fat layer exposed Post Procedure Diagnosis Same as Pre-procedure Electronic Signature(s) Signed: 03/18/2020 4:58:57 PM By: Linton Ham MD Signed: 03/18/2020 5:46:53 PM By: [name] RN, BSN Entered By: Linton Ham on 03/18/2020 13:04:21 -------------------------------------------------------------------------------- HPI Details Patient Name: Date of Service: [name] 03/18/2020 11:00 A M Medical  Record Number: [id number] Patient Account Number: [id number] Date of Birth/Sex: Treating RN: 05/03/1970 (49 y.o. Nancy Fetter Primary Care Provider: [name], [name] Other Clinician: Referring Provider: Treating Provider/Extender: [name], [name] Weeks in Treatment: 2 History of Present Illness HPI Description: 49 year old diabetic who is known to have type 1 diabetes which is poorly controlled last hemoglobin A1c was 11%. She comes in with a ulcerated area on the left lateral foot which has been there for over 6 months. Was recently she has been treated by [name] of podiatry who saw her last on 05/28/2016. Review of his notes revealed that the patient had incision and drainage with placement of antibiotic beads to the left foot on 04/11/2016 for possible osteomyelitis of the cuboid bone. Over the last year she's had a history of amputation of the left fifth toe and a femoropopliteal popliteal bypass graft somewhere in April 2017. 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 His note [name] mentions that the patient has been referred to me for further wound care and possibly great candidate for hyperbaric oxygen therapy due to recurrent osteomyelitis. However we do not have any x-rays of biopsy reports confirming this. He has been on several antibiotics including Bactrim and most recently is on doxycycline for an MRSA. I understand, the patient was not a candidate for IV antibiotics as she has had previous PICC lines which resulted in blood clots in both arms. There was a x-ray report dated 04/04/2016 on [name] notes which showed evidence of fifth ray resection left foot with osteolytic changes noted to the fourth metatarsal and cuboid bone on the left. 06/13/2016 -- had a left foot x-ray which showed no acute fracture or dislocation and no definite radiographic evidence of osteomyelitis. Advanced osteopenia was seen. 06/20/2016 -- she has noticed a  new wound on the right plantar foot in the region where she had a callus before. 06/27/16- the  patient did have her x-ray of the right foot which showed no findings to suggest osteomyelitis. She saw her endocrinologist, [name], yesterday. Her A1c in January was 11. He also indicates mismanagement and noncompliance regarding her diabetes. She is currently on Bactrim for a lip infection. She is complaining of nausea, vomiting and diarrhea. She is unable to articulate the exact orders or dosing of the Bactrim; it is unclear when she will complete this. 07/04/2016 -- results from [facility] of ABIs with ankle waveforms were noted from 02/14/2016. The examination done on 06/27/2015 showed noncompressible ABIs with the right being 1.45 and the left being 1.33. The present examination showed a right ABI of 1.19 on the left of 1.33. The conclusion was that right normal ABI in the lower extremity at rest however compared to previous study which was noncompressible ABI may be falsely elevated side suggesting medial calcification. The left ABI suggested medial calcification. 08/01/2016 -- the patient had more redness and pain on her right foot and did not get to come to see as noted she see her PCP or go to the ER and decided to take some leftover metronidazole which she had at home. As usual, the patient does report she feels and is rather noncompliant. 08/08/2016 -- -- x-ray of the right foot -- FINDINGS:Transmetatarsal amputation is noted. No bony destruction is noted to suggest osteomyelitis. IMPRESSION: No evidence of osteomyelitis. Postsurgical changes are seen. MRI would be more sensitive for possible bony changes. Culture has grown Serratia Marcescens -- sensitive to Bactrim, ciprofloxacin, ceftazidime she was seen by [name] on 08/06/2016. He did not find any exposed bone, muscle, tendon, ligament or joint. There was no malodor and he did a excisional debridement in the  office. ============ Old notes: 49 year old patient who is known to the wound clinic for a while had been away from the wound clinic since 09/01/2014. Over the last several months she has been admitted to various hospitals including [place] [facility] [place]. She was treated for a right metatarsal osteomyelitis with a transmetatarsal amputation and this was done about 2 months ago. He has a small ulcerated area on the right heel and she continues to have an ulcerated area on the left plantar aspect of the foot. The patient was recently admitted to the [facility] hospital group between 7/12 and 10/18/2014. she was given 3 weeks of IV vancomycin and was to follow-up with her surgeons at [facility] and also took oral vancomycin for C. difficile colitis. 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 MRI of the left foot done on 10/14/2014 showed no abscess or osteomyelitis. 04/27/15; this is a patient we know from previous stays in the wound care center. She is a type I diabetic I am not sure of her control currently. Since the last time I saw her she is had a right transmetatarsal amputation and has no wounds on her right foot and has no open wounds. She is been followed at the wound care center at [facility] in [place]. She comes today with the desire to undergo hyperbaric treatment locally. Apparently one of her wound care providers in [place] has suggested hyperbarics. This is in response to an MRI from 04/18/15 that showed increased marrow signal and loss of the proximal fifth metatarsal cortex evidence of  osteomyelitis with likely early osteomyelitis in the cuboid bone as well. She has a large wound over the base of the  fifth metatarsal. She also has a eschar over her the tips of her toes on 1,3 and 5. She does not have peripheral pulses and apparently is going for an angiogram tomorrow which seems reasonable. After this she is going to infectious disease at [facility]. They have been using Medihoney to the large wound on the lateral aspect of the left foot to. The patient has known Charcot deformity from diabetic neuropathy. She also has known diabetic PAD. Surprisingly I can't see that she has had any recent antibiotics, the patient states the last antibiotic she had was at the end of November for 10 days. I think this was in response to culture that showed group G strep although I'm not exactly sure where the culture was from. She is also had arterial studies on 03/29/15. This showed a right ABI of 1.4 that was noncompressible. Her left ABI was 0.73. 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 It was not felt that arterial inflow was adequate for healing of a foot ulcer. Her Doppler waveforms looked monophasic ===== READMISSION 02/28/17; this is in an now 49 year old woman we've had at several different occasions in this clinic. 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 She has a remote ex-smoker. She was last seen in this clinic by Dr. Con Memos I think in May. More recently she is been followed by her podiatrist [name] an infectious disease Dr. Megan Salon. She has 2 open wounds the major one is over the right first metatarsal head she also has a wound on the left plantar foot. an MRI of the right foot on 01/01/17 showed a soft tissue ulcer along the plantar aspect of the first metatarsal base consistent with osteomyelitis of the first metatarsal stump. Dr. 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 According to the patient this is been open for slightly over a month. She has  been on a combination of Cipro 500 twice a day, Zyvox 600 twice a day and Flagyl 500 3 times a day for over a month now as directed by Dr. Megan Salon. cultures of the right foot earlier this year showed MRSA in January and Serratia in May. January also had a few viridans strep. Recent x-rays of both feet were done and [name] office and I don't have these reports. The patient has known PAD and has a history of aleft femoropopliteal bypass in April 2017. 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 Dwyane Dee. In spite of this the last hemoglobin A1c I can see is 10.1 on 01/01/2017. She is being referred by 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 She is been using Medihoney to this area and also an area on the left midfoot. She is using healing sandals bilaterally. ABIs in this clinic at the left posterior tibial was 1.1 noncompressible on the right READMISSION Non invasive vascular NOVANT 5/18 Aftercare following surgery of the circulatory system Procedure Note - Interface, External Ris In - 08/13/2016 11:05 AM EDT Procedure: Examination consists of physiologic resting arterial pressures of the brachial and ankle  arteries bilaterally with continuous wave Doppler waveform analysis. Previous: Previous exam performed on 02/14/16 demonstrated ABIs of Rt = 1.19 and Lt = 1.33. Right: ABI = non-compressible PT 1.47 DP. S/P transmet amputation. , Left: ABI = 1.52, 2nd digit pressure = 87 mmHg Conclusions: Right: ABI (>1.3) may be falsely elevated, suggesting medial calcification. Left: ABI (>1.3) may be falsely elevated, suggesting medial calcification The patient is a now 49 year old type I diabetic is had multiple issues her graded to chronic diabetic foot  ulcers. 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 We had her in the clinic lastin November. At that point she had wounds on her bilateral feet.she had wanted to try hyperbarics however the healogics review process denied her because she hadn't followed up with her vascular surgeon for her left femoropopliteal bypass. The bypass was done by Dr. Raul Del at [facility]. We made her a follow-up with Dr. Raul Del however she did not keep the appointment and therefore she was not approved The patient shows me a small wound on her left fourth metatarsal head on her phone. She developed rapid discoloration in the plantar aspect of the left foot and she was admitted to hospital from 2/2 through 05/10/17 with wet gangrene of the left foot osteomyelitis of the fourth metatarsal heads. She was admitted acutely ill with a temperature of 103. She was started on broad-spectrum vancomycin and cefepime. On 05/06/17 she was taken to the OR by [name] her podiatric surgeon for an incision and drainage irrigation of the left foot wound. Cultures from this surgery revealed group be strep and anaerobes. she was seen by Dr.Xu of orthopedic surgery and scheduled for a below-knee amputation which she u refused. Ultimately she was discharged on Levaquin and Flagyl for one month. MRI 05/05/17 done while she was in the hospital showed abscess adjacent to the fourth metatarsal head and neck small abscess around the fourth flexor tendon. Inflammatory phlegmon and gas in the soft tissues along the lateral aspect of the fourth phalanx. Findings worrisome for osteomyelitis involving the fourth proximal and middle phalanx and also the third and fourth metatarsals. Finally the patient had actually shortly before this followed up with Dr. Raul Del at no time on 04/29/17. He felt that her left femoropopliteal bypass was patent he felt that her left-sided toe  pressures more than adequate for healing a wound on the left foot. This was before her acute presentation. 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 05/28/17; she is started hyperbarics. The patient tells me that for some reason she was not actually on Levaquin but I think on ciprofloxacin. She was on Flagyl. She only started her Levaquin yesterday due to some difficulty with the pharmacy and perhaps her sister picking it up. She has an appointment with [name] tomorrow and with infectious disease early next week. She has no new complaints 06/06/17; the patient continues in hyperbarics. She saw [name] on 05/29/17 who is her podiatric surgeon. He is elected for a transmetatarsal amputation on 06/27/17. I'm not sure at what level he plans to do this amputation. The patient is unaware She also saw Dr. Megan Salon of infectious disease who elected to continue her on current antibiotics I think this is ciprofloxacin and Flagyl. I'll need to clarify with her tomorrow if she actually has this. We're using silver alginate to the actual wound. Necrotic surface today with material under the flap of her foot. Original MRI showed abscesses as well as  osteomyelitis of the proximal and middle fourth phalanx and the third and fourth metatarsal heads 06/11/17; patient continues in hyperbarics and continues on oral antibiotics. She is doing well. The wound looks better. The necrotic part of this under the flap in her superior foot also looks better. she is been to see [name]. I haven't had a chance to look at his note. Apparently he has put the transmetatarsal amputation on hold her request it is still planning to take her to the OR for debridement and product application ACEL. I'll see if I can find his note. I'll therefore leave product ordering/requests to [name] for now. I was going to look at Dermagraft 06/18/17-she is here in follow-up evaluation for bilateral foot wounds. She continues with hyperbaric  therapy. She states she has been applying manuka honey to the right plantar foot and alternate manuka honey and silver alginate to the left foot, despite our orders. We will continue with same treatment plan and she will follo up next week. 06/25/17; I have reviewed [name] last note from 3/11. She has operative debridement in 2 days' time. By review his note apparently they're going to place there is skin over the majority of this wound which is a good choice. She has a small satellite area at the most proximal part of this wound on the left plantar foot. The area on the right plantar foot we've been using silver alginate and it is close to healing. 07/02/17; unfortunately the patient was not easily approved for [name] proposed surgery. I'm not completely certain what the issue is. She has been using silver alginate to the wound she has completed a first course of hyperbarics. She is still on Levaquin and Flagyl. I have really lost track of the time course here.I suspect she should have another week to 2 of antibiotics. I'll need to see if she is followed up with infectious disease Dr. Megan Salon 07/09/17; the patient is followed up with Dr. Megan Salon. 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 She continues on Levaquin and metronidazole continuing both of these for now I think she is been on fr about 6 weeks. She still has some drainage but no pain. No fever. Her had been plans for her to go to the OR for operative debridement with her podiatrist [name], I am not exactly sure where that is. I'll probably slip a note to [name] today. I note that she follows with Dr. Dwyane Dee of endocrinology. We have her recertified for hyperbaric oxygen. I have not heard about Dermagraft however I'll see if [name] is planning a skin substitute as well 07/16/17; the patient tells me she is just about out of [place]. I'll need to check Dr. Hale Bogus last notes on this. She  states she has plenty of Flagyl however. She comes in today complaining of pain in the right lateral foot which she said lasted for about a day. The wound on the right foot is actually much more medially. She also tells me that the [facility] cost a lot of pain in the left foot wound and she turned back to silver alginate. Finally Dermagraft has a $466 per application co-pay. She cannot afford this 07/23/17; patient arrives today with the wound not much smaller. There is not much new to add. She has not heard from [name] all try to put in a call to them today. She was asking about Dermagraft again and  she has an over $672 per application co-pay she states that she would be willing to try to do a payment plan. I been tried to avoid this. We've been using silver alginate, I'll change to [facility] 07/30/17-She is here in follow-up evaluation for left foot ulcer. She continues hyperbaric medicine. The left foot ulcer is stable we will continue with same treatment plan 08/06/17; she is here for evaluation of her left foot ulcer. Currently being treated for hyperbarics or underlying osteomyelitis. She is completed antibiotics. The left foot ulcer is better smaller with healthier looking granulation. For various reasons I am not really clear on we never got her back to the OR with [name]. He did not respond to my secure text message. Nevertheless I think that surgery on this point is not necessary nor am I completely clear that a skin substitute is necessary The patient is complaining about pain on the outside of her right foot. 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 There is no erythema. She also states the foot is warm versus her other part of her upper leg and this is largely true. It is not totally clear to me what's causing this. She thinks it's different from her usual neuropathy pain 08/13/17; she arrives in clinic today with a small wound which is superficial on her right  first metatarsal head. 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 She tells [place] she was up on her feet over the Mother's Day celebration. The large wound is on the left foot. Continues with hyperbarics for underlying osteomyelitis. We're using Hydrofera Blue. She asked me today about where we were with Dermagraft. I had actually excluded this because of the co-pay however she wants to assume this therefore I'll recheck the co-pay an order for next week. 08/20/17; the patient agreed to accept the co-pay of the first Apligraf which we applied today. She is disappointed she is finishing hyperbarics will run this through the insurance on the extent of the foot infection and the extent of the wound that she had however she is already had 60 dive's. Dermagraft No. 1 08/27/17; Dermagraft No. 2. She is not eligible for any more hyperbaric treatments this month. She reports a fair amount of drainage and she actually changed to the external dressings without disturbing the direct contact layer 09/03/17; the patient arrived in clinic today with the wound superficially looking quite healthy. Nice vibrant red tissue with some advancing epithelialization although not as much adherence of the flap as I might like. However she noted on her own fourth toe some bogginess and she brought that to our attention. Indeed this was boggy feeling like a possibility of subcutaneous fluid. 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 She is not been unwell. We've been using Dermagraft 09/10/17; the culture that I did not last week was MRSA. She saw Dr. Megan Salon this morning who is going to start her on vancomycin. I had sent him a secure a text message yesterday. I also spoke with her podiatric surgeon [name] about surgery on this foot the options for conserving a functional foot etc. Promised me he would see her and will make back  consultation today. Paradoxically her actual wound on the plantar aspect of her left foot looks really quite good. I had given her 5 days worth of Baxdella to cover her for MRSA. Her MRI came back showing osteomyelitis  within the third metatarsal shaft and head and base of the third and fourth proximal phalanx. She had extensive inflammatory changes throughout the soft tissue of the lateral forefoot. With an ill-defined fluid around the fourth metatarsal extending into the plantar and dorsal soft tissues 09/19/17; the patient is actually on oral Septra and Flagyl. She apparently refused IV vancomycin. She also saw [name] at my request who is planning her for a left BKA sometime in mid July. MRI showed osteomyelitis within the third metatarsal shaft and head and the basis of the third and fourth proximal phalanx. I believe there was felt to be possible septic arthritis involving the third MTP. 09/26/17; the patient went back to Dr. Megan Salon at my suggestion and is now receiving IV daptomycin. Her wound continues to look quite good making the decision to proceed with a transmetatarsal amputation although more difficult for the patient. I believe in my extensive discussions with her she has a good sense of the pros and cons of this. I don't NV the tuft decision she has to make. She has an appointment with [name] I believe in mid July and I previously spoken to him about this issue Has we had used 3 previous Dermagraft. Given the condition of the wound surface I went ahead and added the fourth one today area and I did this not fully realizing that she'll be traveling to [place] next week. I'm hopeful she can come back in 2 weeks 10/21/17; Her same Dermagraft on for about 3-1/2 weeks. In spite of this the wound arrives looking quite healthy. There is been a lot of healing dimensions are smaller. Looking at the square shaped wound she has now there is some undermining and some depth medially under the  undermining although I cannot palpate any bone. No surrounding infection is obvious. She has difficult questions about how to look at this going forward vis--vis amputations versus continued medical therapy. T be truthful the wound is looks o so healthy and it is continued to contract. Hard to justify foot surgery at this point although I still told her that I think it might come to that if we are not able to eradicate the underlying MRSA. She is still highly at risk and she understands this 11/06/17 on evaluation today patient appears to be doing better in regard to her foot ulcer. She's been tolerating the dressing changes without complication. Currently she is here for her Dermagraft #6. Her wound continues to make excellent progress at this point. She does not appear to have any evidence of infection which is good news. 11/13/17 on evaluation today patient appears to be doing excellent at this time. She is here for repeat Dermagraft application. This is #7. Overall her wound seems to be making great progress. 12/05/17; the patient arrives with the wound in much better condition than when I last saw this almost 6 weeks ago. She still has a small probing area in the left metatarsal head region on the lateral aspect of her foot. We applied her last Dermagraft today. Since the last time she is here she has what appears to a been a blood blister on the plantar aspect of left foot although I don't see this is threatening. There is also a thick raised tissue on the right mid metatarsal head region. This was not there I don't think the last time she was here 3 weeks ago. 12/12/17; the patient continues to have a small programming area in the left metatarsal head region  on the lateral aspect of her foot which was the initial large surgical wound. I applied her last Apligraf last week. I'm going to use Endoform starting today Unfortunately she has an excoriated area in the left mid foot and the right mid foot.  The left midfoot looks like a blistered area this was not opened last week it certainly is open today. Using silver alginate on these areas. She promises me she is offloading this. 12/19/17; the small probing area in the left metatarsal head eyes think is shallower. In general her original wound looks better. We've been using Endoform. The area inferiorly that I think was trauma last week still requires debridement a lot of nonviable surface which I removed. She still has an open open area distally in her foot Similarly on the right foot there is tightly adherent surface debris which I removed. Still areas that don't look completely epithelialized. This is a small open area. We used silver alginate on these areas 12/26/2017; the patient did not have the supplies we ordered from last week including the Endoform. The original large wound on the left lateral foot looks healthy. She still has the undermining area that is largely unchanged from last week. She has the same heavily callused raised edged wounds on the right mid and left midfoot. Both of these requiring debridement. We have been using silver alginate on these areas 01/02/2018; there is still supply issues. We are going to try to use Prisma but I am not sure she actually got it from what she is saying. She has a new open area on the lateral aspect of the left fourth toe [previous fifth ray amputation]. Still the one tunneling area over the fourth metatarsal head. The area is in the midfoot bilaterally still have thick callus around them. She is concerned about a raised swelling on the lateral aspect of the foot. However she is completely insensate 01/10/2018; we are using Prisma to the wounds on her bilateral feet. Surprisingly the tunneling area over the left fourth metatarsal head that was part of her original surgery has closed down. She has a small open area remaining on the incision line. 2 open areas in the midfoot. 02/10/2018; the patient  arrives back in clinic after a month hiatus. She was traveling to visit family in [place]. Is fairly clear she was not offloading the areas on her feet. The original wound over the left lateral foot at the level of metatarsal heads is reopened and probes medially by about a centimeter or 2. She notes that a week ago she had purulent drainage come out of an area on the left midfoot. Paradoxically the worst area is actually on the right foot is extensive with purulent drainage. We will use silver alginate today 02/17/2018; the patient has 3 wounds one over the left lateral foot. She still has a small area over the metatarsal heads which is the remnant of her original surgical wound. This has medial probing depth of roughly 1.4 cm somewhat better than last week. The area on the right foot is larger. We have been using silver alginate to all areas. The area on the right foot and left foot that we cultured last week showed both Klebsiella and Proteus. Both of these are quinolone sensitive. The patient put her's self on Bactrim and Flagyl that she had left hanging around from prior antibiotic usages. She was apparently on this last week when she arrived. I did not realize this. Unfortunately the Bactrim will not cover  either 1 of these organisms. We will send in Cipro 500 twice daily for a week 03/04/2018; the patient has 2 wounds on the left foot one is the original wound which was a surgical wound for a deep DFU. At one point this had exposed bone. She still has an area over the fourth metatarsal head that probes about 1.4 cm although I think this is better than last week. I been using silver nitrate to try and promote tissue adherence and been using silver alginate here. She also has an area in the left midfoot. This has some depth but a small linear wound. Still requiring debridement. On the right midfoot is a circular wound. A lot of thick callus around this area. We have been using silver alginate to  all wound areas She is completed the ciprofloxacin I gave her 2 weeks ago. 03/11/2018; the patient continues to have 2 open areas on the left foot 1 of which was the original surgical wound for a deep DFU. Only a small probing area remains although this is not much different from last week we have been using silver alginate. The other area is on the midfoot this is smaller linear but still with some depth. We have been using silver alginate here as well On the right foot she has a small circular wound in the mid aspect. This is not much smaller than last time. We have been using silver alginate here as well 03/18/2018; she has 3 wounds on the left foot the original surgical wound, a very superficial wound in the mid aspect and then finally the area in the mid plantar foot. She arrives in today with a very concerning area in the wound in the mid plantar foot which is her most proximal wound. There is undermining here of roughly 1-1/2 cm superiorly. Serosanguineous drainage. She tells me she had some pain on for over the weekend that shot up her foot into her thigh and she tells me that she had a nodule in the groin area. She has the single wound in the right foot. We are using endoform to both wound areas 03/24/2018; the patient arrives with the original surgical wound in the area on the left midfoot about the same as last week. There is a collection of fluid under the surface of the skin extending from the surgical wound towards the midfoot although it does not reach the midfoot wound. The area on the right foot is about the same. Cultures from last week of the left midfoot wound showed abundant Klebsiella abundant Enterococcus faecalis and moderate methicillin resistant staph I gave her Levaquin but this would have only covered the Klebsiella. She will need linezolid 04/01/2018; she is taking linezolid but for the first few days only took 1 a day. I have advised her to finish this at twice daily  dosing. In any case all of her wounds are a lot better especially on the left foot. The original surgical wound is closed. The area on the left midfoot considerably smaller. The area on the right foot also smaller. 04/08/2018; her original surgical wound/osteomyelitis on the left foot remains closed. She has area on the left foot that is in the midfoot area but she had some streaking towards this. This is not connected with her original wound at least not visually. Small wound on the right midfoot appears somewhat smaller. 04/15/18; both wounds looks better. Original wound is better left midfoot. Using silver alginate 1/21; patient states she uses saltwater soak in,  stones or remove callus from around her wounds. She is also concerned about a blood blister she had on the left foot but it simply resolved on its own. We've been using silver alginate 1/28; the patient arrives today with the same streaking area from her metatarsals laterally [the site of her original surgical wound] down to the middle of her foot. There is some drainage in the subcutaneous area here. This concerns me that there is actually continued ongoing infection in the metatarsals probably the fourth and third. This fixates an MRI of the foot without contrast [chronic renal failure] The wound in the mid part of the foot is small but I wonder whether this area actually connects with the more distal foot. The area on the right midfoot is probably about the same. Callus thick skin around the small wound which I removed with a curette we have been using silver alginate on both wound areas 2/4; culture I did of the draining site on the left foot last time grew methicillin sensitive staph aureus. MRI of the left foot showed interval resolution of the findings surrounding the third metatarsal joint on the prior study consistent with treated osteomyelitis. Chronic soft tissue ulceration in the plantar and lateral aspect of the forefoot  without residual focal fluid collection. No evidence of recurrent osteomyelitis. 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 She tells me she had nausea and vomiting over the weekend and missed 2 days. In general her foot looks much the same. She has a small open area just below the left fourth metatarsal head. A linear area in the left midfoot. Some discoloration extending from the inferior part of this into the left lateral foot although this appears to be superficial. She has a small area on the right midfoot which generally looks smaller after debridement 2/18; the patient is completing his cephalexin and has another 2 days. She continues to have open areas on the left and right foot. 2/25; she is now off antibiotics. The area on the left foot at the site of her original surgical wound has closed yet again. She still has open areas in the mid part of her foot however these appear smaller. The area on the right mid foot looks about the same. We have been using silver alginate She tells me she had a serious hypoglycemic spell at home. She had to have EMS called and get IV dextrose 3/3; disappointing on the left lateral foot large area of necrotic tissue surrounding the linear area. This appears to track up towards the same original surgical wound. Required extensive debridement. The area on the right plantar foot is not a lot better also using silver 3/12; the culture I did last time showed abundant enterococcus. I have prescribed Augmentin, should cover any unrecognized anaerobes as well. In addition there were a few MRSA and Serratia that would not be well covered although I did not want to give her multiple antibiotics. She comes in today with a new wound  in the right midfoot this is not connected with the original wound over her MTP a lot of thick callus tissue around both wounds but once again she said she is not walking on these areas 3/17-Patient comes in for follow-up on the bilateral plantar  wounds, the right midfoot and the left plantar wound. Both these are heavily callused surrounding the wounds. We are continuing to use silver alginate, she is compliant with offloading and states she uses a wheelchair fairly often at home 3/24; both wound areas have thick callus. However things actually look quite a bit better here for the majority of her left foot and the right foot. 3/31; patient continues to have thick callused somewhat irritated looking tissue around the wounds which individually are fairly superficial. There is no evidence of surrounding infection. We have been using silver alginate however I change that to [facility] today 4/17; patient returns to clinic after having a scare with Covid she tested negative in her primary doctor's office. She has been using Hydrofera Blue. She does not have an open area on the right foot. On the left foot she has a small open area with the mid area not completely viable. She showed me pictures of what looks like a hemorrhagic blister from several days ago but that seems to have healed over this was on the lateral left foot 4/21; patient comes in to clinic with both her wounds on her feet closed. However over the weekend she started having pain in her right foot and leg up into the thigh. She felt as though she was running a low-grade fever but did not take her temperature. She took a doxycycline that she had leftover and yesterday a single Septra and metronidazole. She thinks things feel somewhat better. 4/28; duplex ultrasound I ordered last week was negative for DVT or superficial thrombophlebitis. She is completed the doxycycline I gave her. States she is still having a lot of pain in the right calf  and right ankle which is no better than last week. She cannot sleep. She also states she has a temperature of up to 101, coughing and complaining of visual loss in her bilateral eyes. Apparently she was tested for Covid 2 weeks ago at [facility] and that was negative. Readmission: 09/03/18 patient presents back for reevaluation after having been evaluated at the end of April regarding erythema and swelling of her right lower extremity. Subsequently she ended up going to the hospital on 07/29/18 and was admitted not to be discharged until 08/08/18. Unfortunately it was noted during the time that she was in the hospital that she did have methicillin-resistant Staphylococcus aureus as the infection noted at the site. It was also determined that she did have osteomyelitis which appears to be fairly significant. She was treated with vancomycin and in fact is still on IV vancomycin at dialysis currently. This is actually slated to continue until 09/12/18 at least which will be the completion of the six weeks of therapy. Nonetheless based on what I'm seeing at this point I'm not sure she will be anywhere near ready to discontinue antibiotics at that time. Since she was released from the hospital she was seen by [name] who is her podiatrist on 08/27/18. His note specifically states that he is recommended that the patient needs of one knee amputation on the right as she has a life- threatening situation that can lead quickly to sepsis. The patient advised she would like to try to save her leg to which [name] apparently told her that this was against all medical advice. She also want to discontinue the Wound VAC which had been initiated due to the fact that she wasn't pleased with how the wound was looking and subsequently she wanted to pursue applying Medihoney  at that time. He stated that he did not believe that the right lower extremity was salvageable and that the patient understood but would still like to attempt  hyperbaric option therapy if it could be of any benefit. She was therefore referred back to [place] for further evaluation. He plans to see her back next week. Upon inspection today patient has a significant amount purulent drainage noted from the wound at this point. The bone in the distal portion of her foot also appears to be extremely necrotic and spongy. When I push down on the bone it bubbles and seeps purulent drainage from deeper in the end of the foot. I do not think that this is likely going to heal very well at all and less aggressive surgical debridement were undertaken more than what I believe we can likely do here in our office. 09/12/2018; I have not seen this patient since the most recent hospitalization although she was in our clinic last week. I have reviewed some of her records from a complex hospitalization. 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 There is situation was complicated by MRSA bacteremia and acute on chronic renal failure now on dialysis. She is receiving vancomycin at dialysis. We started her on Dakin's wet-to-dry last week she is changing this daily. There is still purulent drainage coming out of her foot. Although she is apparently "agreeable" to a below-knee amputation which is been suggested by multiple clinicians she wants this to be done in [place]. She apparently has a telehealth visit with that provider sometime in late [place] 6/24. I have told her I think this is probably too long. Nevertheless I could not convince her to allow a local doctor to perform BKA. 09/19/2018; the patient has a large necrotic area on the right anterior foot. She has had previous transmetatarsal amputations. Culture I did last week showed MRSA nothing else she is on vancomycin at dialysis. She has continued leaking purulent drainage out of the distal part of the large circular wound on the right anterior foot. She apparently went to see  Dr. 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 Somehow that translated into her being referred to plastic surgery for debridement of the area. I gather she basically refused amputation although I do not have a copy of Dr. Berenice Primas notes. The patient really wants to have a trial of hyperbaric oxygen. I agreed with initial assessment in this clinic that this was probably too far along to benefit however if she is going to have plastic surgery I think she would benefit from ancillary hyperbaric oxygen. The issue here is that the patient has benefited as maximally as any patient I have ever seen from hyperbaric oxygen therapy. Most recently she had exposed bone on the lateral part of her left foot after a surgical procedure and that actually has closed. She has eschared areas in both heels but no open area. She is remained systemically well. I am not optimistic that anything can be done about this but the patient is very clear that she wants an attempt. The attempt would include a wound VAC further debridements and hyperbaric oxygen along with IV antibiotics. 6/26; I put her in for a trial of hyperbaric oxygen only because of the dramatic response she has had with wounds on her left midfoot earlier this year which was a surgical wound that went straight to her bone over the metatarsal heads and also remotely the left  third toe. We will see if we can get this through our review process and insurance. She arrives in clinic with again purulent material pouring out of necrotic bone on the top of the foot distally. There is also some concerning erythema on the front of the leg that we marked. It is bit difficult to tell how tender this is because of neuropathy. I note from infectious disease that she had her vancomycin extended. 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 She had the wound VAC on for part of the week. The rest of the time she is putting  various things on this including Medihoney, "ionized water" silver sorb gel etc. 7/7; follow-up along with HBO. She is still on vancomycin at dialysis. She has a large open area on the dorsal right foot and a small dark eschar area on her heel. There is a lot less erythema in the area and a lot less tenderness. From an infection point of view I think this is better. She still has a lot of necrosis in the remaining right forefoot [previous TMA] we are still using the wound VAC in this area 7/16; follow-up along with HBO. I put her on linezolid after she finished her vancomycin. We started this last Friday I gave her 2 weeks worth. I had the expectation that she would be operatively debrided by Dr. Marla Roe but that still has not happened yet. Patient phoned the office this week. She arrives for review today after HBO. The distal part of this wound is completely necrotic. Nonviable pieces of tendon bone was still purulent drainage. Also concerning that she has black eschar over the heel that is expanding. I think this may be indicative of infection in this area as well. She has less erythema and warmth in the ankle and calf but still an abnormal exam 7/21 follow-up along with HBO. I will renew her linezolid after checking a CBC with differential monitoring her blood counts especially her platelets. She was supposed to have surgery yesterday but if I am reading things correctly this was canceled after her blood sugar was found to be over 500. I thought Dr. Marla Roe who called me said that they were sending her to the ER but the patient states that was not the case. 7/28. Follow-up along with HBO. She is on linezolid I still do not have any lab work from dialysis even though I called last week. The patient is concerned about an area on her left lateral foot about the level of the base of her fifth metatarsal. I did not really see anything that ominous here however this patient is in [place] ability to  point out problems that she is sensing and she has been accurate in the past Finally she received a call from Dr. Marla Roe who is referring her to another orthopedic surgeon stating that she is too booked up to take her to the operating room now. Was still using a wound VAC on the foot 8/3 -Follow-up after HBO, she is got another week of linezolid, she is to call ID for an appointment, x-rays of both feet were reviewed, the left foot x-ray with third MTP joint osteo- Right foot x-ray widespread osteo-in the right midfoot Right ankle x-ray does not show any active evidence of infection 8/11-Patient is seen after HBO, the wounds on the right foot appear to be about the same, the heel wound had some necrotic base over tendon that was debrided with a curette 8/21;  patient is seen after HBO. The patient's wound on her dorsal foot actually looks reasonably good and there is substantial amount of epithelialization however the open area distally still has a lot of necrotic debris partially bone. I cannot really get a good sense of just how deep this probes under the foot. She has been pressuring me this week to order medical maggots through a company in [place] for her. The problem I have is there is not a defined wound area here. On the positive side there is no purulence. She has been to see infectious disease she is still on Septra DS although I have not had a chance to review their notes 8/28; patient is seen in conjunction with HBO. The wounds on her foot continued to improve including the right dorsal foot substantially the, the distal part of this wound and the area on the right heel. We have been using a wound VAC over this chronically. She is still on trimethoprim as directed by infectious disease 9/4; patient is seen in conjunction with HBO. Right dorsal foot wound substantially anteriorly is better however she continues to have a deep wound in the distal part of this that is not responding.  We have been using silver collagen under border foam Area on the right plantar medial heel seems better. We have been using Hydrofera Blue 12/12/18 on evaluation today patient appears to be doing about the same with regard to her wound based on prior measurements. She does have some necrotic tissue noted on the lateral aspect of the wound that is going require a little bit of sharp debridement today. This includes what appears to be potentially either severely necrotic bone or tendon. Nonetheless other than that she does not appear to have any severe infection which is good news 9/18; it is been 2 weeks since I saw this wound. She is tolerating HBO well. Continued dramatic improvement in the area on the right dorsal foot. She still has a small wound on the heel that we have been using Hydrofera Blue. She continues with a wound VAC 9/24; patient has to be seen emergently today with a swelling on her right lateral lower leg. She says that she told Dr. Evette Doffing about this and also myself on a couple of occasions but I really have no recollection of this. She is not systemically unwell and her wound really looked good the last time I saw this. She showed this to providers at dialysis and she was able to verify that she was started on cephalexin today for 5 doses at dialysis. She dialyzes on Tuesday Thursday and Saturday. 10/2; patient is seen in conjunction with HBO. The area that is draining on the right anterior medial tibia is more extensive. Copious amounts of serosanguineous drainage with some purulence. We are still using the wound VAC on the original wound then it is stable. Culture I did of the original IandD showed MRSA I contacted dialysis she is now on vancomycin with dialysis treatments. I asked them to run a month 10/9; patient seen in conjunction with HBO. She had a new spontaneous open area just above the wound on the right medial tibia ankle. More swelling on the right medial tibia. Her  wound on the foot looks about the same perhaps slightly better. There is no warmth spreading up her leg but no obvious erythema. her MRI of the foot and ankle and distal tib-fib is not booked for next Friday I discussed this with her in great detail over  multiple days. it is likely she has spreading infection upper leg at least involving the distal 25% above the ankle. She knows that if I refer her to orthopedics for infectious disease they are going to recommend amputation and indeed I am not against this myself. We had a good trial at trying to heal the foot which is what she wanted along with antibiotics debridement and HBO however she clearly has spreading infection [probably staph aureus/MRSA]. Nevertheless she once again tells me she wants to wait the left of the MRI. She still makes comments about having her amputation done in [place]. 10/19; arrives today with significant swelling on the lateral right leg. Last culture I did showed Klebsiella. Multidrug-resistant. Cipro was intermediate sensitivity and that is what I have her on pending her MRI which apparently is going to be done on Thursday this week although this seems to be moving back and forth. She is not systemically unwell. We are using silver alginate on her major wound area on the right medial foot and the draining areas on the right lateral lower leg 10/26; MRI showed extensive abscess in the anterior compartment of the right leg also widespread osteomyelitis involving osseous structures of the midfoot and portions of the hindfoot. Also suspicion for osteomyelitis anterior aspect of the distal medial malleolus. Culture I did of the purulence once again showed a multidrug-resistant Klebsiella. I have been in contact with nephrology late last week and she has been started on cefepime at dialysis to replace the vancomycin We sent a copy of her MRI report to Dr. Geroge Baseman in [place] who is an orthopedic surgeon. The patient takes  great stock in his opinion on this. She says she will go to [place] to have her leg amputated if Dr. Geroge Baseman does not feel there is any salvage options. 11/2; she still is not talk to her orthopedic surgeon in [place]. Apparently he will call her at [phone] this afternoon. The quality of this is she has not allowed me to refer her anywhere. She has been told over and over that she needs this amputated but has not agreed to be referred. She tells me her blood sugar was 600 last night but she has not been febrile. 11/9; she never did got a call from the orthopedic surgeon in [place] therefore that is off the radar. We have arranged to get her see orthopedic surgery at [facility]. She still has a lot of draining purulence coming out of the new abscess in her right leg although that probably came from the osteomyelitis in her right foot and heel. Meanwhile the original wound on the right foot looks very healthy. Continued improvement. The issue is that the last MRI showed osteomyelitis in her right foot extensively she now has an abscess in the right anterior lower leg. There is nobody in [place] who will offer this woman anything but an amputation and to be honest that is probably what she needs. I think she still wants to talk about limb salvage although at this point I just do not see that. She has completed her vancomycin at dialysis which was for the original staph aureus she is still on cefepime for the more recent Klebsiella. She has had a long course of both of these antibiotics which should have benefited the osteomyelitis on the right foot as well as the abscess. 11/16; apparently [place] elective surgery is shut down because of COVID-19 pandemic. I have reached out to some contacts at [facility] to see if we can  get her an orthopedic appointment there. I am concerned about continually leaving this but for the moment everything is static. In fact her original large wound on this foot  is closing down. It is the abscess on the right anterior leg that continues to drain purulent serosanguineous material. She is not currently on any antibiotics however she had a prolonged course of vancomycin [1 month] as well as cefepime for a month 02/24/2019 on evaluation today patient appears to be doing better than the last time I saw her. This is not a patient that I typically see. With that being said I am covering for Dr. Dellia Nims this week and again compared to when I last saw her overall the wounds in particular seem to be doing significantly better which is good news. With that being said the patient tells me several disconcerting things. She has not been able to get in to see anyone for potential debridement in regard to her leg wounds although she tells me that she does not think it is necessary any longer because she is taking care of that herself. She noticed a string coming out of the lower wound on her leg over the last week. The patient states that she subsequently decided that we must of pack something in there and started pulling the string out and as it kept coming and coming she realized this was likely her tendon. With that being said she continued to remove as much of this as she could. She then I subsequently proceeded to using tubes of antibiotic ointment which she will stick down into the wound and then scored as much as she can until she sees it coming out of the other wound opening. She states that in doing this she is actually made things better and there is less redness and irritation. With regard to her foot wound she does have some necrotic tendon and tissue noted in one small corner but again the actual wound itself seems to be doing better with good granulation in general compared to my last evaluation. 12/7; continued improvement in the wound on the substantial part of the right medial foot. Still a necrotic area inferiorly that required debridement but the rest of this  looks very healthy and is contracting. She has 2 wounds on the right lateral leg which were her original drainage sites from her abscess but all of this looks a lot better as well. She has been using silver alginate after putting antibiotic biotic ointment in one wound and watching it come out the other. I have talked to her in some detail today. I had given her names of orthopedic surgeons at [facility] for second opinion on what to do about the right leg. I do not think the patient never called them. She has not been able to get a hold of the orthopedic surgeon in [place] that she had put a lot of faith in as being somebody would give her an opinion that she would trust. I talked to her today and said even if I could get her in to another orthopedic surgeon about the leg which she accept an amputation and she said she would not therefore I am not going to press this issue for the moment 12/14; continued improvement in his substantial wound on the right medial foot. There is still a necrotic area inferiorly with tightly adherent necrotic debris which I have been working on debriding each time she is here. She does not have an orthopedic appointment. Since last  time she was here I looked over her cultures which were essentially MRSA on the foot wound and gram-negative rods in the abscess on the anterior leg. 12/21; continued improvement in the area on the right medial foot. She is not up on this much and that is probably a good thing since I do not know it could support continuous ambulation. She has a small area on the right lateral leg which were remanence of the IandD's I did because of the abscess. I think she should probably have prophylactic antibiotics I am going to have to look this over to see if we can make an intelligent decision here. In the meantime her major wound is come down nicely. Necrotic area inferiorly is still there but looks a lot better 04/06/2019; she has had some improvement  in the overall surface area on the right medial foot somewhat narrowedr both but somewhat longer. The areas on the right lateral leg which were initial IandD sites are superficial. Nothing is present on the right heel. We are using silver alginate to the wound areas 1/18; right medial foot somewhat smaller. Still a deep probing area in the most distal recess of the wound. She has nothing open on the right leg. She has a new wound on the plantar aspect of her left fourth toe which may have come from just pulling skin. The patient using Medihoney on the wound on her foot under silver alginate. I cannot discourage her from this 2/1; 2-week follow-up using silver alginate on the right foot and her left fourth toe. The area on the right dorsal foot is contracted although there is still the deep area in the most distal part of the wound but still has some probing depth. No overt infection 2/15; 2-week follow-up. She continues to have improvement in the surface area on the dorsal right foot. Even the tunneling area from last time is almost closed. The area that was on the plantar part of her left fourth toe over the PIP is indeed closed 3/1; 2-week follow-up. Continued improvement in surface area. The original divot that we have been debriding inferiorly I think has full epithelialization although the epithelialization is gone down into the wound with probably 4 mm of depth. Even under intense illumination I am unable to see anything open here. The remanence of the wound in this area actually look quite healthy. We have been using silver alginate 3/15; 2-week follow-up. Unfortunately not as good today. She has a comma shaped wound on the dorsal foot however the upper part of this is larger. Under illumination debris on the surface She also tells [place] that she was on her right leg 2 times in the last couple of weeks mostly to reach up for things above her head etc. She felt a sharp pain in the right leg which  she thinks is somewhere from the ankle to the knee. The patient has neuropathy and is really uncertain. She cannot feel her foot so she does not think it was coming from there 3/29; 2-week follow-up. Her wound measures smaller. Surface of the wound appears reasonable. She is using silver alginate with underlying Medihoney. She has home health. X-rays I did of her tib-fib last time were negative although it did show arterial calcification 4/12; 2-week follow-up. Her wound measures smaller in length. Using manuka honey with silver alginate on top. She has home health. 4/26; 2-week follow-up. Her wound is smaller but still very adherent debris under illumination requiring debridement she has been  using manuka honey with silver alginate. 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 Been using Hydrofera Blue. She is on suppressive Bactrim but apparently she has been taking it twice daily 6/7; I have not seen this wound and about 6 weeks. Since then she was up in [place]. By her own admission she was walking on the foot because she did not have a wheelchair. The wound is not nearly as healthy looking as it was the last time I saw this. We ordered different things for her but she only uses Medihoney and silver alginate. As far as I know she is on suppressive trimethoprim sulfamethoxazole. She does not admit to any fever or chills. Her CBGs apparently are at baseline however she is saying that she feels some discomfort on the lateral part of her ankle I looked over her last inflammatory markers from the summer 2020 at which time she had a deeply necrotic infected wound in this area. On 11/10/2018 her sedimentation rate was 56 and C-reactive protein 9.9. This was 107 and 29 on 07/29/2018. 6/17; the patient had a necrotic wound the last time she was here on the right dorsal foot. After debridement I did a  culture. This showed a very resistant ESBL Klebsiella as well as Enterococcus. Her x-ray of the foot which was done because of warmth and some discomfort showed bone destruction within the carpal bones involving the navicular acute cuboid lateral middle cuneiforms but essentially unchanged from her prior study which was done on 10/29/2018. The findings were felt to represent chronic osteomyelitis. We did inflammatory markers on her. Her white count was 5.25 sedimentation rate 16 and C-reactive protein at 11.1. Notable for the fact that in August 2020 her CRP was 9.9 and sedimentation rate 56. I have looked at her x-rays. It is true that the bone destruction is very impressive however the patient came into this clinic for the wound on her right foot with pieces of bone literally falling out anteriorly with purulent material. I am not exactly sure I could have expected anything different. She has not been systemically unwell no fever chills or blood sugars have been reasonable. 6/28; she arrives with a right heel closed. The substantial area on the right anterior foot looks healthy. Much better looking surface. I think we can change to [facility] seems to help this previously. She is getting her antibiotics at dialysis she should be just about finished 7/9; changed to [facility] last week. Surface wound looks satisfactory not much change in surface area however. She is going to [place] state next week this is usually a difficult thing for this patient follow-up will be for 2 weeks. 7/23; using Hydrofera Blue. She returns from her trip and the wound looks surprisingly good. Usually when this patient goes on trips she comes back with a lot of problems with the wound. She is saying that she sometimes feels an episodic "crunching" feeling on the lateral part of the foot. She is neuropathic and not feeling pain but wonders whether this could be a neuropathic dysesthesia. 11/13/19-Patient returns  after 3 weeks, the wound itself is stable and patient states that there is nothing new going on she is on some extra anxiety medications and is resisting the temptation to pick at the dry skin around the wound. 9/20; patient has not been here in over a month and I have  not seen her in 2 months. The wound in terms of size I think is about the same. There is no exposed bone. She has a nonviable surface on this. She is supposed to be using [facility] however she is also been using some form of honey preparation as well as a silver-based dressings. I do not think she has any pattern to this. 10/4; 2-week follow-up. Patient has been using some form of spray which she says has honey and silver to purchase this online she has been covering it with gauze. In spite of this the wound actually looks quite good. The deeper divot distally appears to be close down. There is a rim of epithelialization. 10/18; 2-week follow-up. Patient has been using her Hydrofera Blue covered with her silver honey spray that she got online. 11/1; 2-week follow-up. She is using Hydrofera Blue with a silver honey spray. Wound bed is measuring smaller. She has noticed that her foot is warmer on the right. She is concerned about infection. For a long period of time I had her on prophylactic trimethoprim sulfamethoxazole DS 1 tablet daily. She is asking for this to be restarted. The patient is walking on this foot because of repairs that are being done in a home her but her room is on the second floor she has to go up and down stairs. I have cautioned against this however as usual she will do exactly what she wants to do 11/15; 2-week follow-up. She uses Hydrofera Blue with a silver/honey spray which I have never heard of. I think her wound looks about the same. Some epithelialization. No evidence that this is infected. I think she is walking on this more than we agreed on. She is going on extensive vacation over Thanksgiving 12/3;  2-week follow-up. She is using Hydrofera Blue however over Thanksgiving she ran out of this and she is simply been using Medihoney. In spite of this her wound is smaller almost divided into 2 now. She traveled extensively over Thanksgiving and actually looks quite good in spite of this. Usually this is been a marker of problems for her 12/17; 2-week follow-up. She is using Hydrofera Blue. The wound is smaller. Debris on the surface of this is fibrinous. She is traveling to [place] over the holidays which never bodes well for her wounds. I think she is walking more on her feet then she is even willing to admit and she tells me she does walk Electronic Signature(s) Signed: 03/18/2020 4:58:57 PM By: Linton Ham MD Entered By: Linton Ham on 03/18/2020 13:05:13 -------------------------------------------------------------------------------- Physical Exam Details Patient Name: Date of Service: [name] 03/18/2020 11:00 A M Medical Record Number: [id number] Patient Account Number: [id number] Date of Birth/Sex: Treating RN: 14-Nov-1970 (49 y.o. Nancy Fetter Primary Care Provider: [name], [name] Other Clinician: Referring Provider: Treating Provider/Extender: [name], [name] Weeks in Treatment: 86 Constitutional Patient is hypertensive.. Pulse regular and within target range for patient.Marland Kitchen Respirations regular, non-labored and within target range.. Temperature is normal and within the target range for the patient.Marland Kitchen Appears in no distress. Notes Wound exam; the wound is measuring smaller. Fibrinous debris over a part of this that is very difficult to debride using a #5 curette subcutaneous tissue to remove a nonviable surface. I did this around a island of epithelialization Electronic Signature(s) Signed: 03/18/2020 4:58:57 PM By: Linton Ham MD Entered By: Linton Ham on 03/18/2020  13:05:56 -------------------------------------------------------------------------------- Physician Orders Details Patient Name: Date of  Service: [name] 03/18/2020 11:00 A M Medical Record Number: [id number] Patient Account Number: [id number] Date of Birth/Sex: Treating RN: March 29, 1971 (49 y.o. 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 Bathing/ Shower/ Hygiene May shower and wash wound with soap and water. Edema Control - Lymphedema / SCD / Other Bilateral Lower Extremities Elevate legs to the level of the heart or above for 30 minutes daily and/or when sitting, a frequency of: - throughout the day Avoid standing for long periods of time. Moisturize legs daily. - with dressing changes Off-Loading Other: - minimal weight bearing right foot Home Health No change in wound care orders this week; continue Home Health for wound care. May utilize formulary equivalent dressing for wound treatment orders unless otherwise specified. Other Home Health Orders/Instructions: - Interim Wound Treatment Wound #43 - Foot Wound Laterality: Right, Medial Cleanser: Wound Cleanser (Home Health) Every Other Day/30 Days Discharge Instructions: Cleanse the wound with wound cleanser or normal saline prior to applying a clean dressing using gauze sponges, not tissue or cotton balls. Prim Dressing: Hydrofera Blue Ready Foam, 4x5 in [facility]) Every  Other Day/30 Days ary Discharge Instructions: Apply to wound bed as instructed Secondary Dressing: Woven Gauze Sponge, Non-Sterile 4x4 in [facility]) Every Other Day/30 Days Discharge Instructions: Apply over primary dressing as directed. Secured With: Elastic Bandage 4 inch (ACE bandage) (Home Health) Every Other Day/30 Days Discharge Instructions: Secure with ACE bandage as directed. Secured With: [employer], 4.5x3.1 (in/yd) [facility]) Every Other Day/30 Days Discharge Instructions: Secure with Kerlix as directed. Secured With: Paper Tape, 2x10 (in/yd) (Home Health) Every Other Day/30 Days Discharge Instructions: Secure dressing with tape as directed. Electronic Signature(s) Signed: 03/18/2020 4:58:57 PM By: Linton Ham MD Signed: 03/18/2020 5:46:53 PM By: [name] RN, BSN Entered By: [name] on 03/18/2020 12:28:23 -------------------------------------------------------------------------------- Problem List Details Patient Name: Date of Service: [name] 03/18/2020 11:00 A M Medical Record Number: [id number] Patient Account Number: [id number] Date of Birth/Sex: Treating RN: 1970/06/11 (49 y.o. Nancy Fetter Primary Care Provider: [name], [name] Other Clinician: Referring Provider: Treating Provider/Extender: [name], [name] Weeks in Treatment: 39 Active Problems ICD-10 Encounter Code Description Active Date MDM Diagnosis M86.671 Other chronic osteomyelitis, right ankle and foot 09/03/2018 No Yes L97.514 Non-pressure chronic ulcer of other part of right foot with necrosis of bone 09/03/2018 No Yes L97.411 Non-pressure chronic ulcer of right heel and midfoot limited to breakdown of 09/17/2019 No Yes skin E10.621 Type 1 diabetes mellitus with foot ulcer 09/24/2018 No Yes Inactive Problems ICD-10 Code Description Active Date Inactive Date L97.521 Non-pressure chronic ulcer of other part of left foot limited to  breakdown of skin 04/20/2019 04/20/2019 L97.812 Non-pressure chronic ulcer of other part of right lower leg with fat layer exposed 02/24/2019 02/24/2019 Resolved Problems ICD-10 Code Description Active Date Resolved Date L02.415 Cutaneous abscess of right lower limb 12/25/2018 12/25/2018 Electronic Signature(s) Signed: 03/18/2020 4:58:57 PM By: Linton Ham MD Entered By: Dellia Nims,  Aul Mangieri on 03/18/2020 13:03:41 -------------------------------------------------------------------------------- Progress Note Details Patient Name: Date of Service: [name] 03/18/2020 11:00 A M Medical Record Number: [id number] Patient Account Number: [id number] Date of Birth/Sex: Treating RN: 03/13/1971 (49 y.o. Nancy Fetter Primary Care Provider: [name], [name] Other Clinician: Referring Provider: Treating Provider/Extender: [name], [name] Weeks in Treatment: 80 Subjective History of Present Illness (HPI) 49 year old diabetic who is known to have type 1 diabetes which is poorly controlled last hemoglobin A1c was 11%. She comes in with a ulcerated area on the left lateral foot which has been there for over 6 months. Was recently she has been treated by [name] of podiatry who saw her last on 05/28/2016. Review of his notes revealed that the patient had incision and drainage with placement of antibiotic beads to the left foot on 04/11/2016 for possible osteomyelitis of the cuboid bone. Over the last year she's had a history of amputation of the left fifth toe and a femoropopliteal popliteal bypass graft somewhere in April 2017. 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 His note [name] mentions that the patient has been referred to me for further wound care and possibly great candidate for hyperbaric oxygen therapy due to recurrent osteomyelitis. However we do not have any x-rays of biopsy reports confirming this. He has been on several antibiotics  including Bactrim and most recently is on doxycycline for an MRSA. I understand, the patient was not a candidate for IV antibiotics as she has had previous PICC lines which resulted in blood clots in both arms. There was a x-ray report dated 04/04/2016 on [name] notes which showed evidence of fifth ray resection left foot with osteolytic changes noted to the fourth metatarsal and cuboid bone on the left. 06/13/2016 -- had a left foot x-ray which showed no acute fracture or dislocation and no definite radiographic evidence of osteomyelitis. Advanced osteopenia was seen. 06/20/2016 -- she has noticed a new wound on the right plantar foot in the region where she had a callus before. 06/27/16- the patient did have her x-ray of the right foot which showed no findings to suggest osteomyelitis. She saw her endocrinologist, [name], yesterday. Her A1c in January was 11. He also indicates mismanagement and noncompliance regarding her diabetes. She is currently on Bactrim for a lip infection. She is complaining of nausea, vomiting and diarrhea. She is unable to articulate the exact orders or dosing of the Bactrim; it is unclear when she will complete this. 07/04/2016 -- results from [facility] of ABIs with ankle waveforms were noted from 02/14/2016. The examination done on 06/27/2015 showed noncompressible ABIs with the right being 1.45 and the left being 1.33. The present examination showed a right ABI of 1.19 on the left of 1.33. The conclusion was that right normal ABI in the lower extremity at rest however compared to previous study which was noncompressible ABI may be falsely elevated side suggesting medial calcification. The left ABI suggested medial calcification. 08/01/2016 -- the patient had more redness and pain on her right foot and did not get to come to see as noted she see her PCP or go to the ER and decided to take some leftover metronidazole which she had at home. As usual, the patient  does report she feels and is rather noncompliant. 08/08/2016 -- -- x-ray of the right foot -- FINDINGS:Transmetatarsal amputation is noted. No bony destruction is noted to suggest osteomyelitis. IMPRESSION: No evidence of  osteomyelitis. Postsurgical changes are seen. MRI would be more sensitive for possible bony changes. Culture has grown Serratia Marcescens -- sensitive to Bactrim, ciprofloxacin, ceftazidime she was seen by [name] on 08/06/2016. He did not find any exposed bone, muscle, tendon, ligament or joint. There was no malodor and he did a excisional debridement in the office. ============ Old notes: 49 year old patient who is known to the wound clinic for a while had been away from the wound clinic since 09/01/2014. Over the last several months she has been admitted to various hospitals including [facility] [place]. She was treated for a right metatarsal osteomyelitis with a transmetatarsal amputation and this was done about 2 months ago. He has a small ulcerated area on the right heel and she continues to have an ulcerated area on the left plantar aspect of the foot. The patient was recently admitted to the [facility] hospital group between 7/12 and 10/18/2014. she was given 3 weeks of IV vancomycin and was to follow-up with her surgeons at [facility] and also took oral vancomycin for C. difficile colitis. 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 MRI of the left foot done on 10/14/2014 showed no abscess or osteomyelitis. 04/27/15; this is a patient we know from previous stays in the wound care center. She is a type I diabetic I am not sure of her control currently. Since the last time I saw  her she is had a right transmetatarsal amputation and has no wounds on her right foot and has no open wounds. She is been followed at the wound care center at [facility] in [place]. She comes today with the desire to undergo hyperbaric treatment locally. Apparently one of her wound care providers in [place] has suggested hyperbarics. This is in response to an MRI from 04/18/15 that showed increased marrow signal and loss of the proximal fifth metatarsal cortex evidence of osteomyelitis with likely early osteomyelitis in the cuboid bone as well. She has a large wound over the base of the fifth metatarsal. She also has a eschar over her the tips of her toes on 1,3 and 5. She does not have peripheral pulses and apparently is going for an angiogram tomorrow which seems reasonable. After this she is going to infectious disease at [facility]. They have been using Medihoney to the large wound on the lateral aspect of the left foot to. The patient has known Charcot deformity from diabetic neuropathy. She also has known diabetic PAD. Surprisingly I can't see that she has had any recent antibiotics, the patient states the last antibiotic she had was at the end of November for 10 days. I think this was in response to culture that showed group G strep although I'm not exactly sure where the culture was from. She is also had arterial studies on 03/29/15. This showed a right ABI of 1.4 that was noncompressible. Her left ABI was 0.73. 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 It was not felt that arterial inflow was adequate for healing of a foot ulcer. Her Doppler waveforms looked monophasic ===== READMISSION 02/28/17; this is in an now 49 year old woman we've had at several different occasions in this clinic. 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 She has a  remote ex-smoker. She was last seen in this clinic by Dr. Con Memos I think in May.  More recently she is been followed by her podiatrist [name] an infectious disease Dr. Megan Salon. She has 2 open wounds the major one is over the right first metatarsal head she also has a wound on the left plantar foot. an MRI of the right foot on 01/01/17 showed a soft tissue ulcer along the plantar aspect of the first metatarsal base consistent with osteomyelitis of the first metatarsal stump. Dr. 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 According to the patient this is been open for slightly over a month. She has been on a combination of Cipro 500 twice a day, Zyvox 600 twice a day and Flagyl 500 3 times a day for over a month now as directed by Dr. Megan Salon. cultures of the right foot earlier this year showed MRSA in January and Serratia in May. January also had a few viridans strep. Recent x-rays of both feet were done and [name] office and I don't have these reports. The patient has known PAD and has a history of aleft femoropopliteal bypass in April 2017. 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 Dwyane Dee. In spite of this the last hemoglobin A1c I can see is 10.1 on 01/01/2017. She is being referred by 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 She is been using Medihoney to this area and also an area on the left midfoot. She is using healing sandals bilaterally. ABIs in this clinic at the left posterior tibial was 1.1 noncompressible on the right READMISSION Non invasive vascular NOVANT 5/18 Aftercare following surgery of the circulatory system Procedure Note - Interface, External Ris In - 08/13/2016 11:05 AM EDT Procedure: Examination consists of physiologic resting  arterial pressures of the brachial and ankle arteries bilaterally with continuous wave Doppler waveform analysis. Previous: Previous exam performed on 02/14/16 demonstrated ABIs of Rt = 1.19 and Lt = 1.33. Right: ABI = non-compressible PT 1.47 DP. S/P transmet amputation. , Left: ABI = 1.52, 2nd digit pressure = 87 mmHg Conclusions: Right: ABI (>1.3) may be falsely elevated, suggesting medial calcification. Left: ABI (>1.3) may be falsely elevated, suggesting medial calcification The patient is a now 49 year old type I diabetic is had multiple issues her graded to chronic diabetic foot ulcers. 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 We had her in the clinic lastin November. At that point she had wounds on her bilateral feet.she had wanted to try hyperbarics however the healogics review process denied her because she hadn't followed up with her vascular surgeon for her left femoropopliteal bypass. The bypass was done by Dr. Raul Del at [facility] We made her a follow-up with Dr. Raul Del however she did not keep the appointment and therefore she was not approved The patient shows me a small wound on her left fourth metatarsal head on her phone. She developed rapid discoloration in the plantar aspect of the left foot and she was admitted to hospital from 2/2 through 05/10/17 with wet gangrene of the left foot osteomyelitis of the fourth metatarsal heads. She was admitted acutely ill with a temperature of 103. She  was started on broad-spectrum vancomycin and cefepime. On 05/06/17 she was taken to the OR by [name] her podiatric surgeon for an incision and drainage irrigation of the left foot wound. Cultures from this surgery revealed group be strep and anaerobes. she was seen by Dr.Xu of orthopedic surgery and scheduled for a below-knee amputation which she u refused. Ultimately she was discharged on Levaquin and  Flagyl for one month. MRI 05/05/17 done while she was in the hospital showed abscess adjacent to the fourth metatarsal head and neck small abscess around the fourth flexor tendon. Inflammatory phlegmon and gas in the soft tissues along the lateral aspect of the fourth phalanx. Findings worrisome for osteomyelitis involving the fourth proximal and middle phalanx and also the third and fourth metatarsals. Finally the patient had actually shortly before this followed up with Dr. Raul Del at no time on 04/29/17. He felt that her left femoropopliteal bypass was patent he felt that her left-sided toe pressures more than adequate for healing a wound on the left foot. This was before her acute presentation. 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 05/28/17; she is started hyperbarics. The patient tells me that for some reason she was not actually on Levaquin but I think on ciprofloxacin. She was on Flagyl. She only started her Levaquin yesterday due to some difficulty with the pharmacy and perhaps her sister picking it up. She has an appointment with [name] tomorrow and with infectious disease early next week. She has no new complaints 06/06/17; the patient continues in hyperbarics. She saw [name] on 05/29/17 who is her podiatric surgeon. He is elected for a transmetatarsal amputation on 06/27/17. I'm not sure at what level he plans to do this amputation. The patient is unaware ooShe also saw Dr. Megan Salon of infectious disease who elected to continue her on current antibiotics I think this is ciprofloxacin and Flagyl. I'll need to clarify with her tomorrow if she actually has this. We're using silver alginate to the actual wound. Necrotic surface today with material under the flap of her foot. ooOriginal MRI showed abscesses as well as osteomyelitis of the proximal and middle fourth phalanx and the third and fourth metatarsal heads 06/11/17; patient continues in hyperbarics and continues on oral  antibiotics. She is doing well. The wound looks better. The necrotic part of this under the flap in her superior foot also looks better. she is been to see [name]. I haven't had a chance to look at his note. Apparently he has put the transmetatarsal amputation on hold her request it is still planning to take her to the OR for debridement and product application ACEL. I'll see if I can find his note. I'll therefore leave product ordering/requests to [name] for now. I was going to look at Dermagraft 06/18/17-she is here in follow-up evaluation for bilateral foot wounds. She continues with hyperbaric therapy. She states she has been applying manuka honey to the right plantar foot and alternate manuka honey and silver alginate to the left foot, despite our orders. We will continue with same treatment plan and she will follo up next week. 06/25/17; I have reviewed [name] last note from 3/11. She has operative debridement in 2 days' time. By review his note apparently they're going to place there is skin over the majority of this wound which is a good choice. She has a small satellite area at the most proximal part of this wound on the left plantar foot. The area on the right plantar  foot we've been using silver alginate and it is close to healing. 07/02/17; unfortunately the patient was not easily approved for [name] proposed surgery. I'm not completely certain what the issue is. She has been using silver alginate to the wound she has completed a first course of hyperbarics. She is still on Levaquin and Flagyl. I have really lost track of the time course here.I suspect she should have another week to 2 of antibiotics. I'll need to see if she is followed up with infectious disease Dr. Megan Salon 07/09/17; the patient is followed up with Dr. Megan Salon. 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 She continues on Levaquin and metronidazole continuing both of these  for now I think she is been on fr about 6 weeks. She still has some drainage but no pain. No fever. Her had been plans for her to go to the OR for operative debridement with her podiatrist [name], I am not exactly sure where that is. I'll probably slip a note to [name] today. I note that she follows with Dr. Dwyane Dee of endocrinology. We have her recertified for hyperbaric oxygen. I have not heard about Dermagraft however I'll see if [name] is planning a skin substitute as well 07/16/17; the patient tells me she is just about out of [place]. I'll need to check Dr. Hale Bogus last notes on this. She states she has plenty of Flagyl however. She comes in today complaining of pain in the right lateral foot which she said lasted for about a day. The wound on the right foot is actually much more medially. She also tells me that the [facility] cost a lot of pain in the left foot wound and she turned back to silver alginate. Finally Dermagraft has a $650 per application co-pay. She cannot afford this 07/23/17; patient arrives today with the wound not much smaller. There is not much new to add. She has not heard from [name] all try to put in a call to them today. She was asking about Dermagraft again and she has an over $354 per application co-pay she states that she would be willing to try to do a payment plan. I been tried to avoid this. We've been using silver alginate, I'll change to [place] 07/30/17-She is here in follow-up evaluation for left foot ulcer. She continues hyperbaric medicine. The left foot ulcer is stable we will continue with same treatment plan 08/06/17; she is here for evaluation of her left foot ulcer. Currently being treated for hyperbarics or underlying osteomyelitis. She is completed antibiotics. The left foot ulcer is better smaller with healthier looking granulation. For various reasons I am not really clear on we never got her back to the OR with [name]. He did  not respond to my secure text message. Nevertheless I think that surgery on this point is not necessary nor am I completely clear that a skin substitute is necessary The patient is complaining about pain on the outside of her right foot. 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 There is no erythema. She also states the foot is warm versus her other part of her upper leg and this is largely true. It is not totally clear to me what's causing this. She thinks it's different from her usual neuropathy pain 08/13/17; she arrives in clinic today with a small wound which is superficial on her right first metatarsal head. 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 She tells [place] she was up on her feet over the Mother's Day celebration. ooThe large wound is on the left foot. Continues with hyperbarics for underlying osteomyelitis. We're using Hydrofera Blue. She asked me today about where we were with Dermagraft. I had actually excluded this because of the co-pay however she wants to assume this therefore I'll recheck the co-pay an order for next week. 08/20/17; the patient agreed to accept the co-pay of the first Apligraf which we applied today. She is disappointed she is finishing hyperbarics will run this through the insurance on the extent of the foot infection and the extent of the wound that she had however she is already had 60 dive's. Dermagraft No. 1 08/27/17; Dermagraft No. 2. She is not eligible for any more hyperbaric treatments this month. She reports a fair amount of drainage and she actually changed to the external dressings without disturbing the direct contact layer 09/03/17; the patient arrived in clinic today with the wound superficially looking quite healthy. Nice vibrant red tissue with some advancing epithelialization although not as much adherence of the flap as I might like. However she noted on her own fourth toe some bogginess and she brought that to our  attention. Indeed this was boggy feeling like a possibility of subcutaneous fluid. 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 She is not been unwell. We've been using Dermagraft 09/10/17; the culture that I did not last week was MRSA. She saw Dr. Megan Salon this morning who is going to start her on vancomycin. I had sent him a secure a text message yesterday. I also spoke with her podiatric surgeon [name] about surgery on this foot the options for conserving a functional foot etc. Promised me he would see her and will make back consultation today. Paradoxically her actual wound on the plantar aspect of her left foot looks really quite good. I had given her 5 days worth of Baxdella to cover her for MRSA. Her MRI came back showing osteomyelitis within the third metatarsal shaft and head and base of the third and fourth proximal phalanx. She had extensive inflammatory changes throughout the soft tissue of the lateral forefoot. With an ill-defined fluid around the fourth metatarsal extending into the plantar and dorsal soft tissues 09/19/17; the patient is actually on oral Septra and Flagyl. She apparently refused IV vancomycin. She also saw [name] at my request who is planning her for a left BKA sometime in mid July. MRI showed osteomyelitis within the third metatarsal shaft and head and the basis of the third and fourth proximal phalanx. I believe there was felt to be possible septic arthritis involving the third MTP. 09/26/17; the patient went back to Dr. Megan Salon at my suggestion and is now receiving IV daptomycin. Her wound continues to look quite good making the decision to proceed with a transmetatarsal amputation although more difficult for the patient. I believe in my extensive discussions with her she has a good sense of the pros and cons of this. I don't NV the tuft decision she has to make. She has an appointment with Dr.  [name] I believe in mid July and I previously spoken to him about this issue Has we had used 3 previous Dermagraft. Given the condition of the wound surface I went ahead and added the fourth one today area and I did this not fully realizing that she'll be traveling  to [place] next week. I'm hopeful she can come back in 2 weeks 10/21/17; Her same Dermagraft on for about 3-1/2 weeks. In spite of this the wound arrives looking quite healthy. There is been a lot of healing dimensions are smaller. Looking at the square shaped wound she has now there is some undermining and some depth medially under the undermining although I cannot palpate any bone. No surrounding infection is obvious. She has difficult questions about how to look at this going forward vis--vis amputations versus continued medical therapy. T be truthful the wound is looks so o healthy and it is continued to contract. Hard to justify foot surgery at this point although I still told her that I think it might come to that if we are not able to eradicate the underlying MRSA. She is still highly at risk and she understands this 11/06/17 on evaluation today patient appears to be doing better in regard to her foot ulcer. She's been tolerating the dressing changes without complication. Currently she is here for her Dermagraft #6. Her wound continues to make excellent progress at this point. She does not appear to have any evidence of infection which is good news. 11/13/17 on evaluation today patient appears to be doing excellent at this time. She is here for repeat Dermagraft application. This is #7. Overall her wound seems to be making great progress. 12/05/17; the patient arrives with the wound in much better condition than when I last saw this almost 6 weeks ago. She still has a small probing area in the left metatarsal head region on the lateral aspect of her foot. We applied her last Dermagraft today. ooSince the last time she is here she has  what appears to a been a blood blister on the plantar aspect of left foot although I don't see this is threatening. There is also a thick raised tissue on the right mid metatarsal head region. This was not there I don't think the last time she was here 3 weeks ago. 12/12/17; the patient continues to have a small programming area in the left metatarsal head region on the lateral aspect of her foot which was the initial large surgical wound. I applied her last Apligraf last week. I'm going to use Endoform starting today ooUnfortunately she has an excoriated area in the left mid foot and the right mid foot. The left midfoot looks like a blistered area this was not opened last week it certainly is open today. Using silver alginate on these areas. She promises me she is offloading this. 12/19/17; the small probing area in the left metatarsal head eyes think is shallower. In general her original wound looks better. We've been using Endoform. The area inferiorly that I think was trauma last week still requires debridement a lot of nonviable surface which I removed. She still has an open open area distally in her foot ooSimilarly on the right foot there is tightly adherent surface debris which I removed. Still areas that don't look completely epithelialized. This is a small open area. We used silver alginate on these areas 12/26/2017; the patient did not have the supplies we ordered from last week including the Endoform. The original large wound on the left lateral foot looks healthy. She still has the undermining area that is largely unchanged from last week. She has the same heavily callused raised edged wounds on the right mid and left midfoot. Both of these requiring debridement. We have been using silver alginate on these areas 01/02/2018; there  is still supply issues. We are going to try to use Prisma but I am not sure she actually got it from what she is saying. She has a new open area on the lateral  aspect of the left fourth toe [previous fifth ray amputation]. Still the one tunneling area over the fourth metatarsal head. The area is in the midfoot bilaterally still have thick callus around them. She is concerned about a raised swelling on the lateral aspect of the foot. However she is completely insensate 01/10/2018; we are using Prisma to the wounds on her bilateral feet. Surprisingly the tunneling area over the left fourth metatarsal head that was part of her original surgery has closed down. She has a small open area remaining on the incision line. 2 open areas in the midfoot. 02/10/2018; the patient arrives back in clinic after a month hiatus. She was traveling to visit family in [place]. Is fairly clear she was not offloading the areas on her feet. The original wound over the left lateral foot at the level of metatarsal heads is reopened and probes medially by about a centimeter or 2. She notes that a week ago she had purulent drainage come out of an area on the left midfoot. Paradoxically the worst area is actually on the right foot is extensive with purulent drainage. We will use silver alginate today 02/17/2018; the patient has 3 wounds one over the left lateral foot. She still has a small area over the metatarsal heads which is the remnant of her original surgical wound. This has medial probing depth of roughly 1.4 cm somewhat better than last week. The area on the right foot is larger. We have been using silver alginate to all areas. The area on the right foot and left foot that we cultured last week showed both Klebsiella and Proteus. Both of these are quinolone sensitive. The patient put her's self on Bactrim and Flagyl that she had left hanging around from prior antibiotic usages. She was apparently on this last week when she arrived. I did not realize this. Unfortunately the Bactrim will not cover either 1 of these organisms. We will send in Cipro 500 twice daily for a  week 03/04/2018; the patient has 2 wounds on the left foot one is the original wound which was a surgical wound for a deep DFU. At one point this had exposed bone. She still has an area over the fourth metatarsal head that probes about 1.4 cm although I think this is better than last week. I been using silver nitrate to try and promote tissue adherence and been using silver alginate here. ooShe also has an area in the left midfoot. This has some depth but a small linear wound. Still requiring debridement. ooOn the right midfoot is a circular wound. A lot of thick callus around this area. ooWe have been using silver alginate to all wound areas ooShe is completed the ciprofloxacin I gave her 2 weeks ago. 03/11/2018; the patient continues to have 2 open areas on the left foot 1 of which was the original surgical wound for a deep DFU. Only a small probing area remains although this is not much different from last week we have been using silver alginate. The other area is on the midfoot this is smaller linear but still with some depth. We have been using silver alginate here as well ooOn the right foot she has a small circular wound in the mid aspect. This is not much smaller than last time.  We have been using silver alginate here as well 03/18/2018; she has 3 wounds on the left foot the original surgical wound, a very superficial wound in the mid aspect and then finally the area in the mid plantar foot. She arrives in today with a very concerning area in the wound in the mid plantar foot which is her most proximal wound. There is undermining here of roughly 1-1/2 cm superiorly. Serosanguineous drainage. She tells me she had some pain on for over the weekend that shot up her foot into her thigh and she tells me that she had a nodule in the groin area. ooShe has the single wound in the right foot. ooWe are using endoform to both wound areas 03/24/2018; the patient arrives with the original surgical  wound in the area on the left midfoot about the same as last week. There is a collection of fluid under the surface of the skin extending from the surgical wound towards the midfoot although it does not reach the midfoot wound. The area on the right foot is about the same. Cultures from last week of the left midfoot wound showed abundant Klebsiella abundant Enterococcus faecalis and moderate methicillin resistant staph I gave her Levaquin but this would have only covered the Klebsiella. She will need linezolid 04/01/2018; she is taking linezolid but for the first few days only took 1 a day. I have advised her to finish this at twice daily dosing. In any case all of her wounds are a lot better especially on the left foot. The original surgical wound is closed. The area on the left midfoot considerably smaller. The area on the right foot also smaller. 04/08/2018; her original surgical wound/osteomyelitis on the left foot remains closed. She has area on the left foot that is in the midfoot area but she had some streaking towards this. This is not connected with her original wound at least not visually. ooSmall wound on the right midfoot appears somewhat smaller. 04/15/18; both wounds looks better. Original wound is better left midfoot. Using silver alginate 1/21; patient states she uses saltwater soak in, stones or remove callus from around her wounds. She is also concerned about a blood blister she had on the left foot but it simply resolved on its own. We've been using silver alginate 1/28; the patient arrives today with the same streaking area from her metatarsals laterally [the site of her original surgical wound] down to the middle of her foot. There is some drainage in the subcutaneous area here. This concerns me that there is actually continued ongoing infection in the metatarsals probably the fourth and third. This fixates an MRI of the foot without contrast [chronic renal failure] ooThe wound  in the mid part of the foot is small but I wonder whether this area actually connects with the more distal foot. ooThe area on the right midfoot is probably about the same. Callus thick skin around the small wound which I removed with a curette we have been using silver alginate on both wound areas 2/4; culture I did of the draining site on the left foot last time grew methicillin sensitive staph aureus. MRI of the left foot showed interval resolution of the findings surrounding the third metatarsal joint on the prior study consistent with treated osteomyelitis. Chronic soft tissue ulceration in the plantar and lateral aspect of the forefoot without residual focal fluid collection. No evidence of recurrent osteomyelitis. 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 She tells me she had nausea and vomiting over the weekend and missed 2 days. In general her foot looks much the same. She has a small open area just below the left fourth metatarsal head. A linear area in the left midfoot. Some discoloration extending from the inferior part of this into the left lateral foot although this appears to be superficial. She has a small area on the right midfoot which generally looks smaller after debridement 2/18; the patient is completing his cephalexin and has another 2 days. She continues to have open areas on the left and right foot. 2/25; she is now off antibiotics. The area on the left foot at the site of her original surgical wound has closed yet again. She still has open areas in the mid part of her foot however these appear smaller. The area on the right mid foot looks about the same. We have been using silver alginate She tells me she had a serious hypoglycemic  spell at home. She had to have EMS called and get IV dextrose 3/3; disappointing on the left lateral foot large area of necrotic tissue surrounding the linear area. This appears to track up towards the same original surgical wound. Required extensive debridement. The area on the right plantar foot is not a lot better also using silver 3/12; the culture I did last time showed abundant enterococcus. I have prescribed Augmentin, should cover any unrecognized anaerobes as well. In addition there were a few MRSA and Serratia that would not be well covered although I did not want to give her multiple antibiotics. She comes in today with a new wound in the right midfoot this is not connected with the original wound over her MTP a lot of thick callus tissue around both wounds but once again she said she is not walking on these areas 3/17-Patient comes in for follow-up on the bilateral plantar wounds, the right midfoot and the left plantar wound. Both these are heavily callused surrounding the wounds. We are continuing to use silver alginate, she is compliant with offloading and states she uses a wheelchair fairly often at home 3/24; both wound areas have thick callus. However things actually look quite a bit better here for the majority of her left foot and the right foot. 3/31; patient continues to have thick callused somewhat irritated looking tissue around the wounds which individually are fairly superficial. There is no evidence of surrounding infection. We have been using silver alginate however I change that to [facility] today 4/17; patient returns to clinic after having a scare with Covid she tested negative in her primary doctor's office. She has been using Hydrofera Blue. She does not have an open area on the right foot. On the left foot she has a small open area with the mid area not completely viable. She showed me pictures of what looks like a hemorrhagic blister from several days ago but that  seems to have healed over this was on the lateral left foot 4/21; patient comes in to clinic with both her wounds on her feet closed. However over the weekend she started having pain in her right foot and leg up into the thigh. She felt as though she was running a low-grade fever but did not take her temperature. She took a  doxycycline that she had leftover and yesterday a single Septra and metronidazole. She thinks things feel somewhat better. 4/28; duplex ultrasound I ordered last week was negative for DVT or superficial thrombophlebitis. She is completed the doxycycline I gave her. States she is still having a lot of pain in the right calf and right ankle which is no better than last week. She cannot sleep. She also states she has a temperature of up to 101, coughing and complaining of visual loss in her bilateral eyes. Apparently she was tested for Covid 2 weeks ago at [facility] and that was negative. Readmission: 09/03/18 patient presents back for reevaluation after having been evaluated at the end of April regarding erythema and swelling of her right lower extremity. Subsequently she ended up going to the hospital on 07/29/18 and was admitted not to be discharged until 08/08/18. Unfortunately it was noted during the time that she was in the hospital that she did have methicillin-resistant Staphylococcus aureus as the infection noted at the site. It was also determined that she did have osteomyelitis which appears to be fairly significant. She was treated with vancomycin and in fact is still on IV vancomycin at dialysis currently. This is actually slated to continue until 09/12/18 at least which will be the completion of the six weeks of therapy. Nonetheless based on what I'm seeing at this point I'm not sure she will be anywhere near ready to discontinue antibiotics at that time. Since she was released from the hospital she was seen by [name] who is her podiatrist on 08/27/18. His note specifically  states that he is recommended that the patient needs of one knee amputation on the right as she has a life- threatening situation that can lead quickly to sepsis. The patient advised she would like to try to save her leg to which [name] apparently told her that this was against all medical advice. She also want to discontinue the Wound VAC which had been initiated due to the fact that she wasn't pleased with how the wound was looking and subsequently she wanted to pursue applying Medihoney at that time. He stated that he did not believe that the right lower extremity was salvageable and that the patient understood but would still like to attempt hyperbaric option therapy if it could be of any benefit. She was therefore referred back to [place] for further evaluation. He plans to see her back next week. Upon inspection today patient has a significant amount purulent drainage noted from the wound at this point. The bone in the distal portion of her foot also appears to be extremely necrotic and spongy. When I push down on the bone it bubbles and seeps purulent drainage from deeper in the end of the foot. I do not think that this is likely going to heal very well at all and less aggressive surgical debridement were undertaken more than what I believe we can likely do here in our office. 09/12/2018; I have not seen this patient since the most recent hospitalization although she was in our clinic last week. I have reviewed some of her records from a complex hospitalization. 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 There is situation was complicated by MRSA bacteremia and acute on chronic renal failure now on dialysis. She is receiving vancomycin at dialysis. We started her on Dakin's wet-to-dry last week she is changing this daily. There is still purulent drainage coming out of her foot. Although  she is apparently "agreeable" to a below-knee amputation which is  been suggested by multiple clinicians she wants this to be done in [place]. She apparently has a telehealth visit with that provider sometime in late [place] 6/24. I have told her I think this is probably too long. Nevertheless I could not convince her to allow a local doctor to perform BKA. 09/19/2018; the patient has a large necrotic area on the right anterior foot. She has had previous transmetatarsal amputations. Culture I did last week showed MRSA nothing else she is on vancomycin at dialysis. She has continued leaking purulent drainage out of the distal part of the large circular wound on the right anterior foot. She apparently went to see Dr. 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 Somehow that translated into her being referred to plastic surgery for debridement of the area. I gather she basically refused amputation although I do not have a copy of Dr. Berenice Primas notes. The patient really wants to have a trial of hyperbaric oxygen. I agreed with initial assessment in this clinic that this was probably too far along to benefit however if she is going to have plastic surgery I think she would benefit from ancillary hyperbaric oxygen. The issue here is that the patient has benefited as maximally as any patient I have ever seen from hyperbaric oxygen therapy. Most recently she had exposed bone on the lateral part of her left foot after a surgical procedure and that actually has closed. She has eschared areas in both heels but no open area. She is remained systemically well. I am not optimistic that anything can be done about this but the patient is very clear that she wants an attempt. The attempt would include a wound VAC further debridements and hyperbaric oxygen along with IV antibiotics. 6/26; I put her in for a trial of hyperbaric oxygen only because of the dramatic response she has had with wounds on her left midfoot earlier this year which was a surgical wound  that went straight to her bone over the metatarsal heads and also remotely the left third toe. We will see if we can get this through our review process and insurance. She arrives in clinic with again purulent material pouring out of necrotic bone on the top of the foot distally. There is also some concerning erythema on the front of the leg that we marked. It is bit difficult to tell how tender this is because of neuropathy. I note from infectious disease that she had her vancomycin extended. 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 She had the wound VAC on for part of the week. The rest of the time she is putting various things on this including Medihoney, "ionized water" silver sorb gel etc. 7/7; follow-up along with HBO. She is still on vancomycin at dialysis. She has a large open area on the dorsal right foot and a small dark eschar area on her heel. There is a lot less erythema in the area and a lot less tenderness. From an infection point of view I think this is better. She still has a lot of necrosis in the remaining right forefoot [previous TMA] we are still using the wound VAC in this area 7/16; follow-up along with HBO. I put her on linezolid after she finished her vancomycin. We started this last Friday I gave her 2 weeks worth. I had the expectation that she would be operatively debrided by Dr. Marla Roe but  that still has not happened yet. Patient phoned the office this week. She arrives for review today after HBO. The distal part of this wound is completely necrotic. Nonviable pieces of tendon bone was still purulent drainage. Also concerning that she has black eschar over the heel that is expanding. I think this may be indicative of infection in this area as well. She has less erythema and warmth in the ankle and calf but still an abnormal exam 7/21 follow-up along with HBO. I will renew her linezolid after checking a CBC with differential monitoring her  blood counts especially her platelets. She was supposed to have surgery yesterday but if I am reading things correctly this was canceled after her blood sugar was found to be over 500. I thought Dr. Marla Roe who called me said that they were sending her to the ER but the patient states that was not the case. 7/28. Follow-up along with HBO. She is on linezolid I still do not have any lab work from dialysis even though I called last week. The patient is concerned about an area on her left lateral foot about the level of the base of her fifth metatarsal. I did not really see anything that ominous here however this patient is in [place] ability to point out problems that she is sensing and she has been accurate in the past Finally she received a call from Dr. Marla Roe who is referring her to another orthopedic surgeon stating that she is too booked up to take her to the operating room now. Was still using a wound VAC on the foot 8/3 -Follow-up after HBO, she is got another week of linezolid, she is to call ID for an appointment, x-rays of both feet were reviewed, the left foot x-ray with third MTP joint osteo- Right foot x-ray widespread osteo-in the right midfoot Right ankle x-ray does not show any active evidence of infection 8/11-Patient is seen after HBO, the wounds on the right foot appear to be about the same, the heel wound had some necrotic base over tendon that was debrided with a curette 8/21; patient is seen after HBO. The patient's wound on her dorsal foot actually looks reasonably good and there is substantial amount of epithelialization however the open area distally still has a lot of necrotic debris partially bone. I cannot really get a good sense of just how deep this probes under the foot. She has been pressuring me this week to order medical maggots through a company in [place] for her. The problem I have is there is not a defined wound area here. On the positive side there is  no purulence. She has been to see infectious disease she is still on Septra DS although I have not had a chance to review their notes 8/28; patient is seen in conjunction with HBO. The wounds on her foot continued to improve including the right dorsal foot substantially the, the distal part of this wound and the area on the right heel. We have been using a wound VAC over this chronically. She is still on trimethoprim as directed by infectious disease 9/4; patient is seen in conjunction with HBO. Right dorsal foot wound substantially anteriorly is better however she continues to have a deep wound in the distal part of this that is not responding. We have been using silver collagen under border foam ooArea on the right plantar medial heel seems better. We have been using Hydrofera Blue 12/12/18 on evaluation today patient appears to be doing  about the same with regard to her wound based on prior measurements. She does have some necrotic tissue noted on the lateral aspect of the wound that is going require a little bit of sharp debridement today. This includes what appears to be potentially either severely necrotic bone or tendon. Nonetheless other than that she does not appear to have any severe infection which is good news 9/18; it is been 2 weeks since I saw this wound. She is tolerating HBO well. Continued dramatic improvement in the area on the right dorsal foot. She still has a small wound on the heel that we have been using Hydrofera Blue. She continues with a wound VAC 9/24; patient has to be seen emergently today with a swelling on her right lateral lower leg. She says that she told Dr. Evette Doffing about this and also myself on a couple of occasions but I really have no recollection of this. She is not systemically unwell and her wound really looked good the last time I saw this. She showed this to providers at dialysis and she was able to verify that she was started on cephalexin today for 5 doses  at dialysis. She dialyzes on Tuesday Thursday and Saturday. 10/2; patient is seen in conjunction with HBO. The area that is draining on the right anterior medial tibia is more extensive. Copious amounts of serosanguineous drainage with some purulence. We are still using the wound VAC on the original wound then it is stable. Culture I did of the original IandD showed MRSA I contacted dialysis she is now on vancomycin with dialysis treatments. I asked them to run a month 10/9; patient seen in conjunction with HBO. She had a new spontaneous open area just above the wound on the right medial tibia ankle. More swelling on the right medial tibia. Her wound on the foot looks about the same perhaps slightly better. There is no warmth spreading up her leg but no obvious erythema. her MRI of the foot and ankle and distal tib-fib is not booked for next Friday I discussed this with her in great detail over multiple days. it is likely she has spreading infection upper leg at least involving the distal 25% above the ankle. She knows that if I refer her to orthopedics for infectious disease they are going to recommend amputation and indeed I am not against this myself. We had a good trial at trying to heal the foot which is what she wanted along with antibiotics debridement and HBO however she clearly has spreading infection [probably staph aureus/MRSA]. Nevertheless she once again tells me she wants to wait the left of the MRI. She still makes comments about having her amputation done in [place]. 10/19; arrives today with significant swelling on the lateral right leg. Last culture I did showed Klebsiella. Multidrug-resistant. Cipro was intermediate sensitivity and that is what I have her on pending her MRI which apparently is going to be done on Thursday this week although this seems to be moving back and forth. She is not systemically unwell. We are using silver alginate on her major wound area on the right  medial foot and the draining areas on the right lateral lower leg 10/26; MRI showed extensive abscess in the anterior compartment of the right leg also widespread osteomyelitis involving osseous structures of the midfoot and portions of the hindfoot. Also suspicion for osteomyelitis anterior aspect of the distal medial malleolus. Culture I did of the purulence once again showed a multidrug-resistant Klebsiella. I have been  in contact with nephrology late last week and she has been started on cefepime at dialysis to replace the vancomycin We sent a copy of her MRI report to Dr. Geroge Baseman in [place] who is an orthopedic surgeon. The patient takes great stock in his opinion on this. She says she will go to [place] to have her leg amputated if Dr. Geroge Baseman does not feel there is any salvage options. 11/2; she still is not talk to her orthopedic surgeon in [place]. Apparently he will call her at [phone] this afternoon. The quality of this is she has not allowed me to refer her anywhere. She has been told over and over that she needs this amputated but has not agreed to be referred. She tells me her blood sugar was 600 last night but she has not been febrile. 11/9; she never did got a call from the orthopedic surgeon in [place] therefore that is off the radar. We have arranged to get her see orthopedic surgery at [facility]. She still has a lot of draining purulence coming out of the new abscess in her right leg although that probably came from the osteomyelitis in her right foot and heel. Meanwhile the original wound on the right foot looks very healthy. Continued improvement. The issue is that the last MRI showed osteomyelitis in her right foot extensively she now has an abscess in the right anterior lower leg. There is nobody in [place] who will offer this woman anything but an amputation and to be honest that is probably what she needs. I think she still wants to talk about limb salvage although  at this point I just do not see that. She has completed her vancomycin at dialysis which was for the original staph aureus she is still on cefepime for the more recent Klebsiella. She has had a long course of both of these antibiotics which should have benefited the osteomyelitis on the right foot as well as the abscess. 11/16; apparently [place] elective surgery is shut down because of COVID-19 pandemic. I have reached out to some contacts at [facility] to see if we can get her an orthopedic appointment there. I am concerned about continually leaving this but for the moment everything is static. In fact her original large wound on this foot is closing down. It is the abscess on the right anterior leg that continues to drain purulent serosanguineous material. She is not currently on any antibiotics however she had a prolonged course of vancomycin [1 month] as well as cefepime for a month 02/24/2019 on evaluation today patient appears to be doing better than the last time I saw her. This is not a patient that I typically see. With that being said I am covering for Dr. Dellia Nims this week and again compared to when I last saw her overall the wounds in particular seem to be doing significantly better which is good news. With that being said the patient tells me several disconcerting things. She has not been able to get in to see anyone for potential debridement in regard to her leg wounds although she tells me that she does not think it is necessary any longer because she is taking care of that herself. She noticed a string coming out of the lower wound on her leg over the last week. The patient states that she subsequently decided that we must of pack something in there and started pulling the string out and as it kept coming and coming she realized this was likely her tendon.  With that being said she continued to remove as much of this as she could. She then I subsequently proceeded to using tubes of  antibiotic ointment which she will stick down into the wound and then scored as much as she can until she sees it coming out of the other wound opening. She states that in doing this she is actually made things better and there is less redness and irritation. With regard to her foot wound she does have some necrotic tendon and tissue noted in one small corner but again the actual wound itself seems to be doing better with good granulation in general compared to my last evaluation. 12/7; continued improvement in the wound on the substantial part of the right medial foot. Still a necrotic area inferiorly that required debridement but the rest of this looks very healthy and is contracting. She has 2 wounds on the right lateral leg which were her original drainage sites from her abscess but all of this looks a lot better as well. She has been using silver alginate after putting antibiotic biotic ointment in one wound and watching it come out the other. I have talked to her in some detail today. I had given her names of orthopedic surgeons at [facility] for second opinion on what to do about the right leg. I do not think the patient never called them. She has not been able to get a hold of the orthopedic surgeon in [place] that she had put a lot of faith in as being somebody would give her an opinion that she would trust. I talked to her today and said even if I could get her in to another orthopedic surgeon about the leg which she accept an amputation and she said she would not therefore I am not going to press this issue for the moment 12/14; continued improvement in his substantial wound on the right medial foot. There is still a necrotic area inferiorly with tightly adherent necrotic debris which I have been working on debriding each time she is here. She does not have an orthopedic appointment. Since last time she was here I looked over her cultures which were essentially MRSA on the foot wound and  gram-negative rods in the abscess on the anterior leg. 12/21; continued improvement in the area on the right medial foot. She is not up on this much and that is probably a good thing since I do not know it could support continuous ambulation. She has a small area on the right lateral leg which were remanence of the IandD's I did because of the abscess. I think she should probably have prophylactic antibiotics I am going to have to look this over to see if we can make an intelligent decision here. In the meantime her major wound is come down nicely. Necrotic area inferiorly is still there but looks a lot better 04/06/2019; she has had some improvement in the overall surface area on the right medial foot somewhat narrowedr both but somewhat longer. The areas on the right lateral leg which were initial IandD sites are superficial. Nothing is present on the right heel. We are using silver alginate to the wound areas 1/18; right medial foot somewhat smaller. Still a deep probing area in the most distal recess of the wound. She has nothing open on the right leg. She has a new wound on the plantar aspect of her left fourth toe which may have come from just pulling skin. The patient using Medihoney on the  wound on her foot under silver alginate. I cannot discourage her from this 2/1; 2-week follow-up using silver alginate on the right foot and her left fourth toe. The area on the right dorsal foot is contracted although there is still the deep area in the most distal part of the wound but still has some probing depth. No overt infection 2/15; 2-week follow-up. She continues to have improvement in the surface area on the dorsal right foot. Even the tunneling area from last time is almost closed. The area that was on the plantar part of her left fourth toe over the PIP is indeed closed 3/1; 2-week follow-up. Continued improvement in surface area. The original divot that we have been debriding inferiorly I think  has full epithelialization although the epithelialization is gone down into the wound with probably 4 mm of depth. Even under intense illumination I am unable to see anything open here. The remanence of the wound in this area actually look quite healthy. We have been using silver alginate 3/15; 2-week follow-up. Unfortunately not as good today. She has a comma shaped wound on the dorsal foot however the upper part of this is larger. Under illumination debris on the surface She also tells [place] that she was on her right leg 2 times in the last couple of weeks mostly to reach up for things above her head etc. She felt a sharp pain in the right leg which she thinks is somewhere from the ankle to the knee. The patient has neuropathy and is really uncertain. She cannot feel her foot so she does not think it was coming from there 3/29; 2-week follow-up. Her wound measures smaller. Surface of the wound appears reasonable. She is using silver alginate with underlying Medihoney. She has home health. X-rays I did of her tib-fib last time were negative although it did show arterial calcification 4/12; 2-week follow-up. Her wound measures smaller in length. Using manuka honey with silver alginate on top. She has home health. 4/26; 2-week follow-up. Her wound is smaller but still very adherent debris under illumination requiring debridement she has been using manuka honey with silver alginate. 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 Been using Hydrofera Blue. She is on suppressive Bactrim but apparently she has been taking it twice daily 6/7; I have not seen this wound and about 6 weeks. Since then she was up in [place]. By her own admission she was walking on the foot because she did not have a wheelchair. The wound is not nearly as healthy looking as it was the last time I saw this. We ordered different  things for her but she only uses Medihoney and silver alginate. As far as I know she is on suppressive trimethoprim sulfamethoxazole. She does not admit to any fever or chills. Her CBGs apparently are at baseline however she is saying that she feels some discomfort on the lateral part of her ankle I looked over her last inflammatory markers from the summer 2020 at which time she had a deeply necrotic infected wound in this area. On 11/10/2018 her sedimentation rate was 56 and C-reactive protein 9.9. This was 107 and 29 on 07/29/2018. 6/17; the patient had a necrotic wound the last time she was here on the right dorsal foot. After debridement I did a culture. This showed a very resistant ESBL Klebsiella as well as Enterococcus. Her x-ray  of the foot which was done because of warmth and some discomfort showed bone destruction within the carpal bones involving the navicular acute cuboid lateral middle cuneiforms but essentially unchanged from her prior study which was done on 10/29/2018. The findings were felt to represent chronic osteomyelitis. We did inflammatory markers on her. Her white count was 5.25 sedimentation rate 16 and C-reactive protein at 11.1. Notable for the fact that in August 2020 her CRP was 9.9 and sedimentation rate 56. I have looked at her x-rays. It is true that the bone destruction is very impressive however the patient came into this clinic for the wound on her right foot with pieces of bone literally falling out anteriorly with purulent material. I am not exactly sure I could have expected anything different. She has not been systemically unwell no fever chills or blood sugars have been reasonable. 6/28; she arrives with a right heel closed. The substantial area on the right anterior foot looks healthy. Much better looking surface. I think we can change to [facility] seems to help this previously. She is getting her antibiotics at dialysis she should be just about finished 7/9;  changed to [facility] last week. Surface wound looks satisfactory not much change in surface area however. She is going to [place] state next week this is usually a difficult thing for this patient follow-up will be for 2 weeks. 7/23; using Hydrofera Blue. She returns from her trip and the wound looks surprisingly good. Usually when this patient goes on trips she comes back with a lot of problems with the wound. She is saying that she sometimes feels an episodic "crunching" feeling on the lateral part of the foot. She is neuropathic and not feeling pain but wonders whether this could be a neuropathic dysesthesia. 11/13/19-Patient returns after 3 weeks, the wound itself is stable and patient states that there is nothing new going on she is on some extra anxiety medications and is resisting the temptation to pick at the dry skin around the wound. 9/20; patient has not been here in over a month and I have not seen her in 2 months. The wound in terms of size I think is about the same. There is no exposed bone. She has a nonviable surface on this. She is supposed to be using [facility] however she is also been using some form of honey preparation as well as a silver-based dressings. I do not think she has any pattern to this. 10/4; 2-week follow-up. Patient has been using some form of spray which she says has honey and silver to purchase this online she has been covering it with gauze. In spite of this the wound actually looks quite good. The deeper divot distally appears to be close down. There is a rim of epithelialization. 10/18; 2-week follow-up. Patient has been using her Hydrofera Blue covered with her silver honey spray that she got online. 11/1; 2-week follow-up. She is using Hydrofera Blue with a silver honey spray. Wound bed is measuring smaller. She has noticed that her foot is warmer on the right. She is concerned about infection. For a long period of time I had her on prophylactic  trimethoprim sulfamethoxazole DS 1 tablet daily. She is asking for this to be restarted. The patient is walking on this foot because of repairs that are being done in a home her but her room is on the second floor she has to go up and down stairs. I have cautioned against this however as  usual she will do exactly what she wants to do 11/15; 2-week follow-up. She uses Hydrofera Blue with a silver/honey spray which I have never heard of. I think her wound looks about the same. Some epithelialization. No evidence that this is infected. I think she is walking on this more than we agreed on. She is going on extensive vacation over Thanksgiving 12/3; 2-week follow-up. She is using Hydrofera Blue however over Thanksgiving she ran out of this and she is simply been using Medihoney. In spite of this her wound is smaller almost divided into 2 now. She traveled extensively over Thanksgiving and actually looks quite good in spite of this. Usually this is been a marker of problems for her 12/17; 2-week follow-up. She is using Hydrofera Blue. The wound is smaller. Debris on the surface of this is fibrinous. She is traveling to [place] over the holidays which never bodes well for her wounds. I think she is walking more on her feet then she is even willing to admit and she tells me she does walk Objective Constitutional Patient is hypertensive.. Pulse regular and within target range for patient.Marland Kitchen Respirations regular, non-labored and within target range.. Temperature is normal and within the target range for the patient.Marland Kitchen Appears in no distress. Vitals Time Taken: 11:48 AM, Height: 67 in, Weight: 125 lbs, BMI: 19.6, Temperature: 98.6 F, Pulse: 90 bpm, Respiratory Rate: 18 breaths/min, Blood Pressure: 150/74 mmHg. General Notes: Wound exam; the wound is measuring smaller. Fibrinous debris over a part of this that is very difficult to debride using a #5 curette subcutaneous tissue to remove a nonviable surface. I  did this around a island of epithelialization Integumentary (Hair, Skin) Wound #43 status is Open. Original cause of wound was Gradually Appeared. The wound is located on the Right,Medial Foot. The wound measures 4cm length x 2cm width x 0.1cm depth; 6.283cm^2 area and 0.628cm^3 volume. There is Fat Layer (Subcutaneous Tissue) exposed. There is no tunneling or undermining noted. There is a medium amount of serosanguineous drainage noted. The wound margin is flat and intact. There is medium (34-66%) pink granulation within the wound bed. There is a medium (34-66%) amount of necrotic tissue within the wound bed including Adherent Slough. 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 There was a Excisional Skin/Subcutaneous Tissue Debridement with a total area of 8 sq cm performed by Ricard Dillon., MD. With the following instrument(s): Curette to remove Viable and Non-Viable tissue/material. Material removed includes Subcutaneous Tissue and Slough and after achieving pain control using Other (Benzocaine 20%). No specimens were taken. A time out was conducted at 12:24, prior to the start of the procedure. A Minimum amount of bleeding was controlled with Pressure. The procedure was tolerated well with a pain level of 0 throughout and a pain level of 0 following the procedure. Post Debridement Measurements: 4cm length x 2cm width x 0.1cm depth; 0.628cm^3 volume. Character of Wound/Ulcer Post Debridement is improved. Severity of Tissue Post Debridement is: Fat layer  exposed. Post procedure Diagnosis Wound #43: Same as Pre-Procedure Plan Follow-up Appointments: Return appointment in 3 weeks. Bathing/ Shower/ Hygiene: May  shower and wash wound with soap and water. Edema Control - Lymphedema / SCD / Other: Elevate legs to the level of the heart or above for 30 minutes daily and/or when sitting, a frequency of: - throughout the day Avoid standing for long periods of time. Moisturize legs daily. - with dressing changes Off-Loading: Other: - minimal weight bearing right foot Home Health: No change in wound care orders this week; continue Home Health for wound care. May utilize formulary equivalent dressing for wound treatment orders unless otherwise specified. Other Home Health Orders/Instructions: - Interim WOUND #43: - Foot Wound Laterality: Right, Medial Cleanser: Wound Cleanser (Home Health) Every Other Day/30 Days Discharge Instructions: Cleanse the wound with wound cleanser or normal saline prior to applying a clean dressing using gauze sponges, not tissue or cotton balls. Prim Dressing: Hydrofera Blue Ready Foam, 4x5 in [facility]) Every Other Day/30 Days ary Discharge Instructions: Apply to wound bed as instructed Secondary Dressing: Woven Gauze Sponge, Non-Sterile 4x4 in [facility]) Every Other Day/30 Days Discharge Instructions: Apply over primary dressing as directed. Secured With: Elastic Bandage 4 inch (ACE bandage) (Home Health) Every Other Day/30 Days Discharge Instructions: Secure with ACE bandage as directed. Secured With: [employer], 4.5x3.1 (in/yd) [facility]) Every Other Day/30 Days Discharge Instructions: Secure with Kerlix as directed. Secured With: Paper T ape, 2x10 (in/yd) (Home Health) Every Other Day/30 Days Discharge Instructions: Secure dressing with tape as directed. 1. I continue with the Hydrofera Blue. I am hopeful she is compliant with this at home 2. She showed me a dark spot on her left lateral foot  now this is over a subluxed bone. I warned her about walking excessively on these areas this is a Charcot deformity 3. I will see her back after Lysle Morales Electronic Signature(s) Signed: 03/18/2020 4:58:57 PM By: Linton Ham MD Entered By: Linton Ham on 03/18/2020 13:07:20 -------------------------------------------------------------------------------- SuperBill Details Patient Name: Date of Service: [name] 03/18/2020 Medical Record Number: [id number] Patient Account Number: [id number] Date of Birth/Sex: Treating RN: [birth year] (49 y.o. Nancy Fetter Primary Care Provider: [name], [name] Other Clinician: Referring Provider: Treating Provider/Extender: [name], [name] Weeks in Treatment: 52 Diagnosis Coding ICD-10 Codes Code Description [id number] Other chronic osteomyelitis, right ankle and foot L97.514 Non-pressure chronic ulcer of other part of right foot with necrosis of bone L97.411 Non-pressure chronic ulcer of right heel and midfoot limited to breakdown of skin E10.621 Type 1 diabetes mellitus with foot ulcer Facility Procedures CPT4 Code: [id number] Description: 01601 - DEB SUBQ TISSUE 20 SQ CM/< ICD-10 Diagnosis Description L97.514 Non-pressure chronic ulcer of other part of right foot with necrosis of bone Modifier: Quantity: 1 Physician Procedures : CPT4 Code Description Modifier [id number] - WC PHYS SUBQ TISS 20 SQ CM ICD-10 Diagnosis Description L97.514 Non-pressure chronic ulcer of other part of right foot with necrosis of bone Quantity: 1 Electronic Signature(s) Signed: 03/18/2020 4:58:57 PM By: Linton Ham MD Entered By: Linton Ham on 03/18/2020 13:07:46

## 2020-03-22 NOTE — Progress Notes (Signed)
[name], [name] ([id number]) Visit Report for 03/18/2020 Arrival Information Details Patient Name: Date of Service: Nancy LLO [name] 03/18/2020 11:00 A M Medical Record Number: [id number] Patient Account Number: [id number] Date of Birth/Sex: Treating RN: 1971/02/26 (49 y.o. [name] Primary Care Helios Kohlmann: [name], [name] Other Clinician: Referring [name]: Treating Indyah [name]: [name], [name] Weeks in Treatment: 66 Visit Information History Since Last Visit Added or deleted any medications: No Patient Arrived: Wheel Chair Any new allergies or adverse reactions: No Arrival Time: 11:48 Had a fall or experienced change in No Accompanied By: self activities of daily living that may affect Transfer Assistance: None risk of falls: Patient Identification Verified: Yes Signs or symptoms of abuse/neglect since last visito No Secondary Verification Process Completed: Yes Hospitalized since last visit: No Patient Requires Transmission-Based Precautions: No Implantable device outside of the clinic excluding No Patient Has Alerts: No cellular tissue based products placed in the center since last visit: Has Dressing in Place as Prescribed: Yes Pain Present Now: No Electronic Signature(s) Signed: 03/22/2020 2:20:57 PM By: Sandre Kitty Entered By: Sandre Kitty on 03/18/2020 11:48:31 -------------------------------------------------------------------------------- Lower Extremity Assessment Details Patient Name: Date of Service: Nancy [name] 03/18/2020 11:00 A M Medical Record Number: [id number] Patient Account Number: [id number] Date of Birth/Sex: Treating RN: [birth year] (49 y.o. [name], [name] Primary Care Bernice Mullin: [name], [name] Other Clinician: Referring [name]: Treating [name]: [name], [name] Weeks in Treatment: 80 Edema Assessment Assessed: [Left: No] [Right: Yes] Edema:  [Left: N] [Right: o] Calf Left: Right: Point of Measurement: From Medial Instep 27.55 cm Ankle Left: Right: Point of Measurement: From Medial Instep 19 cm Vascular Assessment Pulses: Dorsalis Pedis Palpable: [Right:Yes] Posterior Tibial Palpable: [Right:Yes] Electronic Signature(s) Signed: 03/18/2020 5:28:37 PM By: Rhae Hammock RN Entered By: Rhae Hammock on 03/18/2020 11:50:49 -------------------------------------------------------------------------------- Multi Wound Chart Details Patient Name: Date of Service: Nancy LLO [name] 03/18/2020 11:00 A M Medical Record Number: [id number] Patient Account Number: [id number] Date of Birth/Sex: Treating RN: 1970/05/24 (49 y.o. [name] Primary Care Baeleigh Devincent: [name], [name] Other Clinician: Referring Penney Domanski: Treating Melis Trochez/Extender: [name], [name] Weeks in Treatment: 80 Vital Signs Height(in): 23 Pulse(bpm): 90 Weight(lbs): 125 Blood Pressure(mmHg): 150/74 Body Mass Index(BMI): 20 Temperature(F): 98.6 Respiratory Rate(breaths/min): 18 Photos: [43:No Photos Right, Medial Foot] [N/A:N/A N/A] Wound Location: [43:Gradually Appeared] [N/A:N/A] Wounding Event: [43:Diabetic Wound/Ulcer of the Lower] [N/A:N/A] Primary Etiology: [43:Extremity Cataracts, Chronic sinus] [N/A:N/A] Comorbid History: [43:problems/congestion, Anemia, Sleep Apnea, Deep Vein Thrombosis, Hypertension, Peripheral Arterial Disease, Type I Diabetes, Osteoarthritis, Osteomyelitis, Neuropathy, Seizure Disorder 08/04/2018] [N/A:N/A] Date Acquired: [43:80] [N/A:N/A] Weeks of Treatment: [43:Open] [N/A:N/A] Wound Status: [43:Yes] [N/A:N/A] Clustered Wound: [43:4x2x0.1] [N/A:N/A] Measurements L x W x D (cm) [43:6.283] [N/A:N/A] A (cm) : rea [43:0.628] [N/A:N/A] Volume (cm) : [43:91.30%] [N/A:N/A] % Reduction in A [43:rea: 97.10%] [N/A:N/A] % Reduction in Volume: [43:Grade 4] [N/A:N/A] Classification: [43:Medium]  [N/A:N/A] Exudate A mount: [43:Serosanguineous] [N/A:N/A] Exudate Type: [43:red, brown] [N/A:N/A] Exudate Color: [43:Flat and Intact] [N/A:N/A] Wound Margin: [43:Medium (34-66%)] [N/A:N/A] Granulation A mount: [43:Pink] [N/A:N/A] Granulation Quality: [43:Medium (34-66%)] [N/A:N/A] Necrotic A mount: [43:Fat Layer (Subcutaneous Tissue): Yes N/A] Exposed Structures: [43:Fascia: No Tendon: No Muscle: No Joint: No Bone: No Small (1-33%)] [N/A:N/A] Epithelialization: [43:Debridement - Excisional] [N/A:N/A] Debridement: Pre-procedure Verification/Time Out 12:24 [N/A:N/A] Taken: [43:Other] [N/A:N/A] Pain Control: [43:Subcutaneous, Slough] [N/A:N/A] Tissue Debrided: [43:Skin/Subcutaneous Tissue] [N/A:N/A] Level: [43:8] [N/A:N/A] Debridement A (sq cm): [43:rea Curette] [  N/A:N/A] Instrument: [43:Minimum] [N/A:N/A] Bleeding: [43:Pressure] [N/A:N/A] Hemostasis A chieved: [43:0] [N/A:N/A] Procedural Pain: [43:0] [N/A:N/A] Post Procedural Pain: [43:Procedure was tolerated well] [N/A:N/A] Debridement Treatment Response: [43:4x2x0.1] [N/A:N/A] Post Debridement Measurements L x W x D (cm) [51:8.841] [N/A:N/A] Post Debridement Volume: (cm) [43:Debridement] [N/A:N/A] Treatment Notes Electronic Signature(s) Signed: 03/18/2020 4:58:57 PM By: Linton Ham MD Signed: 03/18/2020 5:46:53 PM By: [name] RN, BSN Entered By: Linton Ham on 03/18/2020 13:03:54 -------------------------------------------------------------------------------- Multi-Disciplinary Care Plan Details Patient Name: Date of Service: Nancy LLO [name] 03/18/2020 11:00 A M Medical Record Number: [id number] Patient Account Number: [id number] Date of Birth/Sex: Treating RN: [birth year] (49 y.o. [name] Primary Care Kenadee Gates: [name], [name] Other Clinician: Referring Syrita Dovel: Treating Francia Verry/Extender: [name], [name] Weeks in Treatment: 38 Active Inactive Wound/Skin  Impairment Nursing Diagnoses: Impaired tissue integrity Knowledge deficit related to ulceration/compromised skin integrity Goals: Patient/caregiver will verbalize understanding of skin care regimen Date Initiated: 09/03/2018 Target Resolution Date: 04/15/2020 Goal Status: Active Ulcer/skin breakdown will have a volume reduction of 30% by week 4 Date Initiated: 09/03/2018 Date Inactivated: 10/07/2018 Target Resolution Date: 10/01/2018 Goal Status: Unmet Unmet Reason: COMORBITIES Ulcer/skin breakdown will have a volume reduction of 50% by week 8 Date Initiated: 10/07/2018 Date Inactivated: 11/03/2018 Target Resolution Date: 10/31/2018 Goal Status: Unmet Unmet Reason: Osteomyelitis Interventions: Assess patient/caregiver ability to obtain necessary supplies Assess patient/caregiver ability to perform ulcer/skin care regimen upon admission and as needed Assess ulceration(s) every visit Provide education on ulcer and skin care Treatment Activities: Skin care regimen initiated : 09/03/2018 Topical wound management initiated : 09/03/2018 Notes: Electronic Signature(s) Signed: 03/18/2020 5:46:53 PM By: [name] RN, BSN Entered By: [name] on 03/18/2020 17:35:05 -------------------------------------------------------------------------------- Pain Assessment Details Patient Name: Date of Service: Nancy LLO [name] 03/18/2020 11:00 A M Medical Record Number: [id number] Patient Account Number: [id number] Date of Birth/Sex: Treating RN: March 26, 1971 (49 y.o. [name] Primary Care Xion Debruyne: [name], [name] Other Clinician: Referring Philippe Gang: Treating Peder Allums/Extender: [name], [name] Weeks in Treatment: 7 Active Problems Location of Pain Severity and Description of Pain Patient Has Paino No Site Locations Pain Management and Medication Current Pain Management: Electronic Signature(s) Signed: 03/18/2020 5:46:53 PM By: [name] RN, BSN Signed:  03/22/2020 2:20:57 PM By: Sandre Kitty Entered By: Sandre Kitty on 03/18/2020 11:48:55 -------------------------------------------------------------------------------- Patient/Caregiver Education Details Patient Name: Date of Service: Nancy LLO [name] 12/17/2021andnbsp11:00 A M Medical Record Number: [id number] Patient Account Number: [id number] Date of Birth/Gender: Treating RN: [birth year] (49 y.o. [name] Primary Care Physician: [name], [name] Other Clinician: Referring Physician: Treating Physician/Extender: [name], [name] Weeks in Treatment: 74 Education Assessment Education Provided To: Patient Education Topics Provided Wound/Skin Impairment: Methods: Explain/Verbal Responses: State content correctly Electronic Signature(s) Signed: 03/18/2020 5:46:53 PM By: [name] RN, BSN Entered By: [name] on 03/18/2020 17:35:17 -------------------------------------------------------------------------------- Wound Assessment Details Patient Name: Date of Service: Nancy LLO [name] 03/18/2020 11:00 A M Medical Record Number: [id number] Patient Account Number: [id number] Date of Birth/Sex: Treating RN: [birth year] (49 y.o. [name], [name] Primary Care Braylon Lemmons: [name], [name] Other Clinician: Referring Maynard David: Treating Avyn Aden/Extender: [name], [name] Weeks in Treatment: 80 Wound Status Wound Number: 43 Primary Diabetic Wound/Ulcer of the Lower Extremity Etiology: Wound Location: Right, Medial Foot Wound Open Wounding Event: Gradually Appeared Status: Date Acquired: 08/04/2018 Comorbid Cataracts, Chronic sinus problems/congestion, Anemia, Sleep Weeks Of  Treatment: 80 History: Apnea, Deep Vein Thrombosis, Hypertension, Peripheral Arterial Clustered Wound: Yes Disease, Type I Diabetes, Osteoarthritis, Osteomyelitis, Neuropathy, Seizure Disorder Photos Photo Uploaded By: [name] on  03/22/2020 10:44:33 Wound Measurements Length: (cm) 4 Width: (cm) 2 Depth: (cm) 0.1 Area: (cm) 6.283 Volume: (cm) 0.628 % Reduction in Area: 91.3% % Reduction in Volume: 97.1% Epithelialization: Small (1-33%) Tunneling: No Undermining: No Wound Description Classification: Grade 4 Wound Margin: Flat and Intact Exudate Amount: Medium Exudate Type: Serosanguineous Exudate Color: red, brown Foul Odor After Cleansing: No Slough/Fibrino Yes Wound Bed Granulation Amount: Medium (34-66%) Exposed Structure Granulation Quality: Pink Fascia Exposed: No Necrotic Amount: Medium (34-66%) Fat Layer (Subcutaneous Tissue) Exposed: Yes Necrotic Quality: Adherent Slough Tendon Exposed: No Muscle Exposed: No Joint Exposed: No Bone Exposed: No Electronic Signature(s) Signed: 03/18/2020 5:28:37 PM By: Rhae Hammock RN Entered By: Rhae Hammock on 03/18/2020 11:52:15 -------------------------------------------------------------------------------- Vitals Details Patient Name: Date of Service: Nancy LLO [name] 03/18/2020 11:00 A M Medical Record Number: [id number] Patient Account Number: [id number] Date of Birth/Sex: Treating RN: Mar 31, 1971 (49 y.o. [name] Primary Care Tedi Hughson: [name], [name] Other Clinician: Referring Gervis Gaba: Treating Miakoda Mcmillion/Extender: [name], [name] Weeks in Treatment: 42 Vital Signs Time Taken: 11:48 Temperature (F): 98.6 Height (in): 67 Pulse (bpm): 90 Weight (lbs): 125 Respiratory Rate (breaths/min): 18 Body Mass Index (BMI): 19.6 Blood Pressure (mmHg): 150/74 Reference Range: 80 - 120 mg / dl Electronic Signature(s) Signed: 03/22/2020 2:20:57 PM By: Sandre Kitty Entered By: Sandre Kitty on 03/18/2020 11:48:49

## 2020-03-24 ENCOUNTER — Ambulatory Visit: Payer: Medicare HMO | Admitting: Endocrinology

## 2020-03-25 IMAGING — CR RIGHT ANKLE - 2 VIEW
3 series · 3 of 3 positions shown · non-contrast
Comparison: RIGHT foot radiographs 07/30/2018

CLINICAL DATA: On to myelitis of the RIGHT foot

EXAM:
RIGHT ANKLE - 2 VIEW

[t ankle joint lat right]
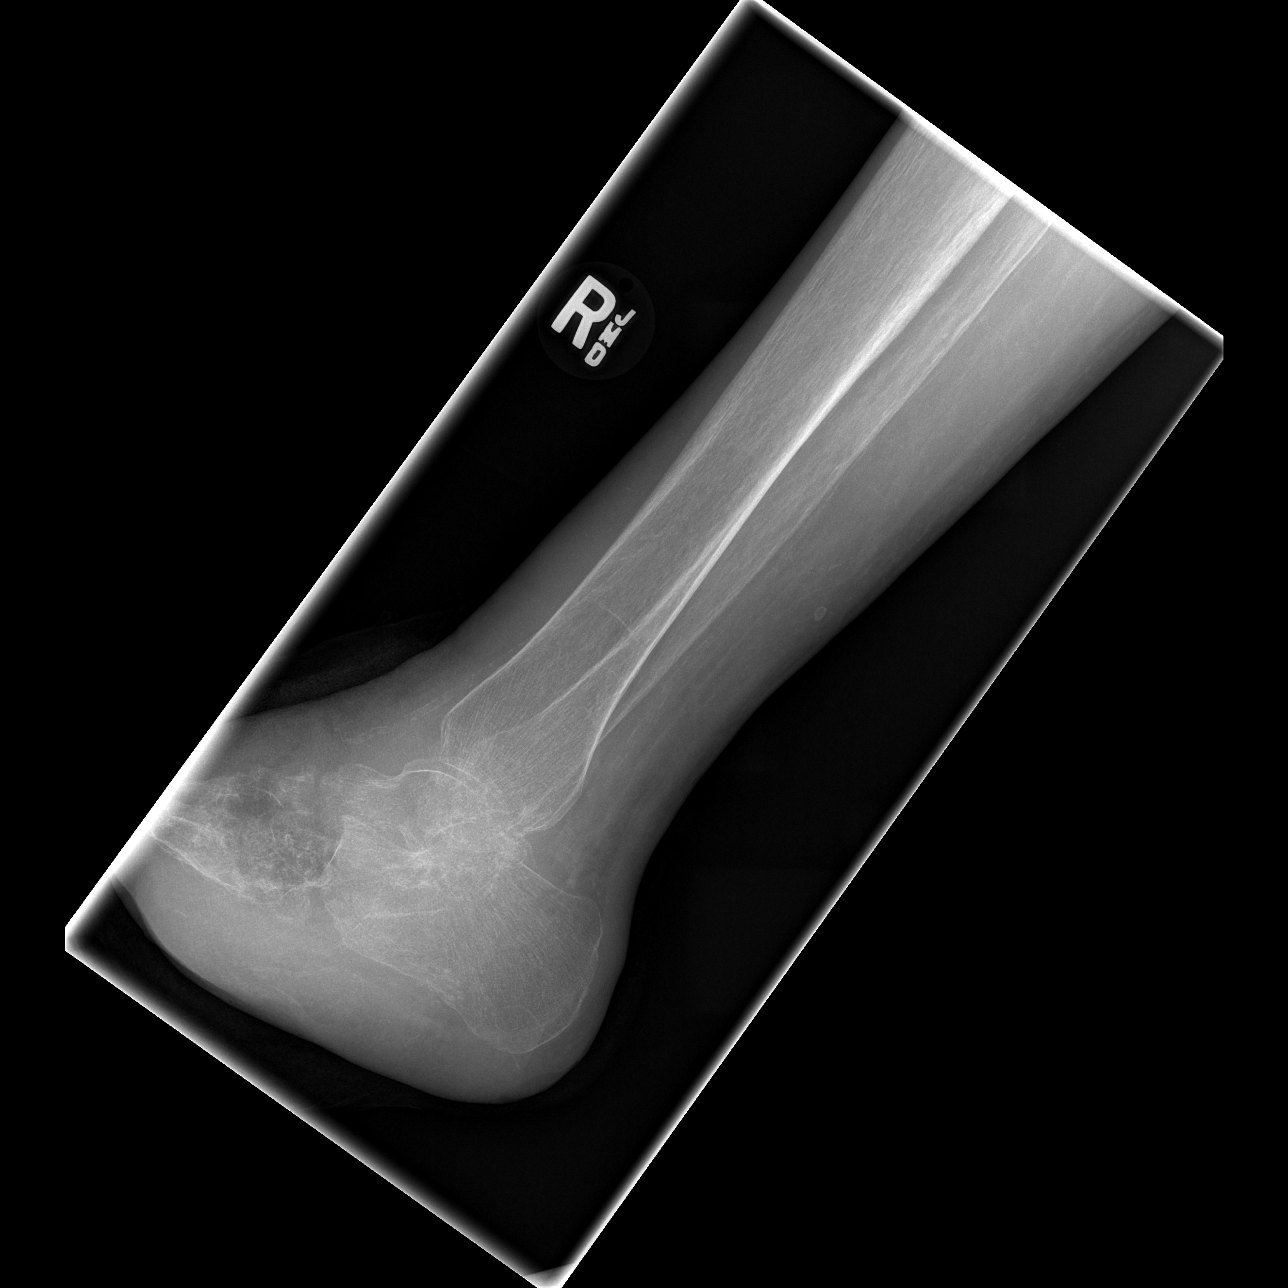

[t ankle joint ap right (1 of 2)]
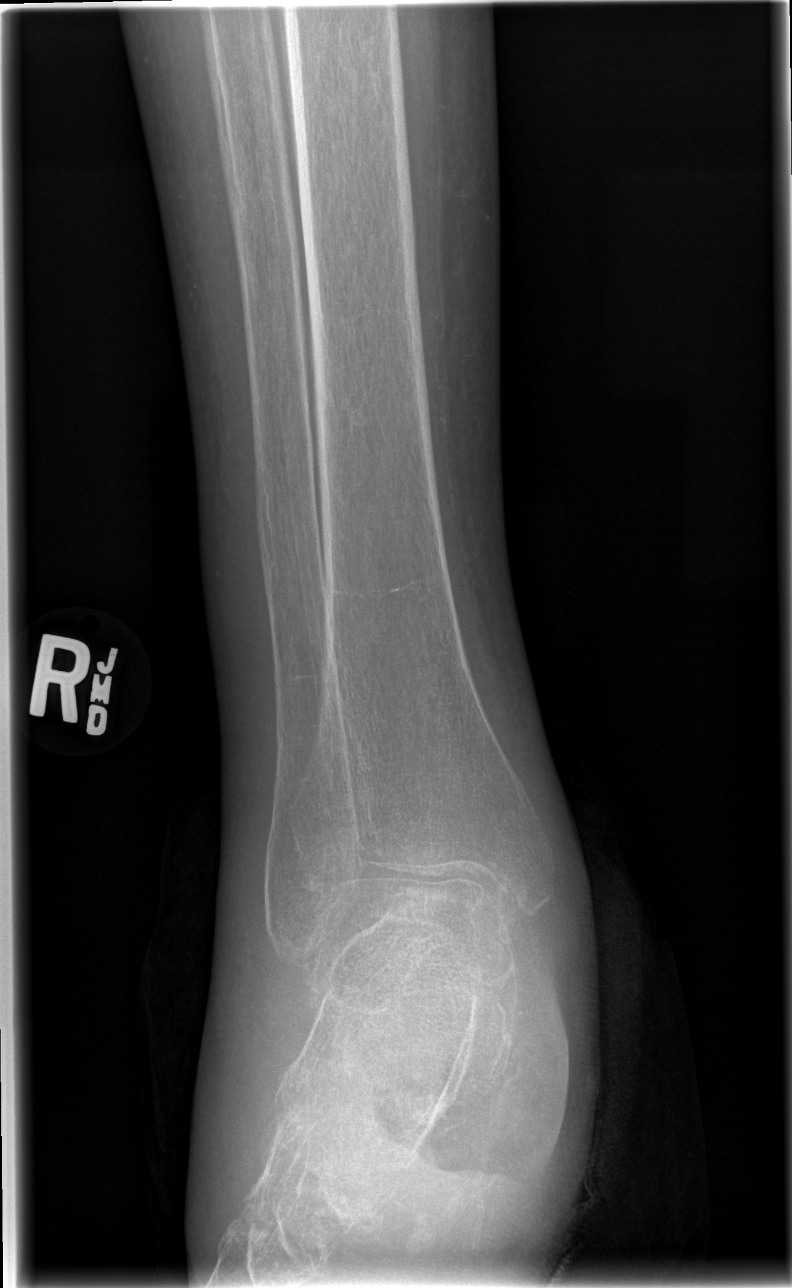

[t ankle joint ap right (2 of 2)]
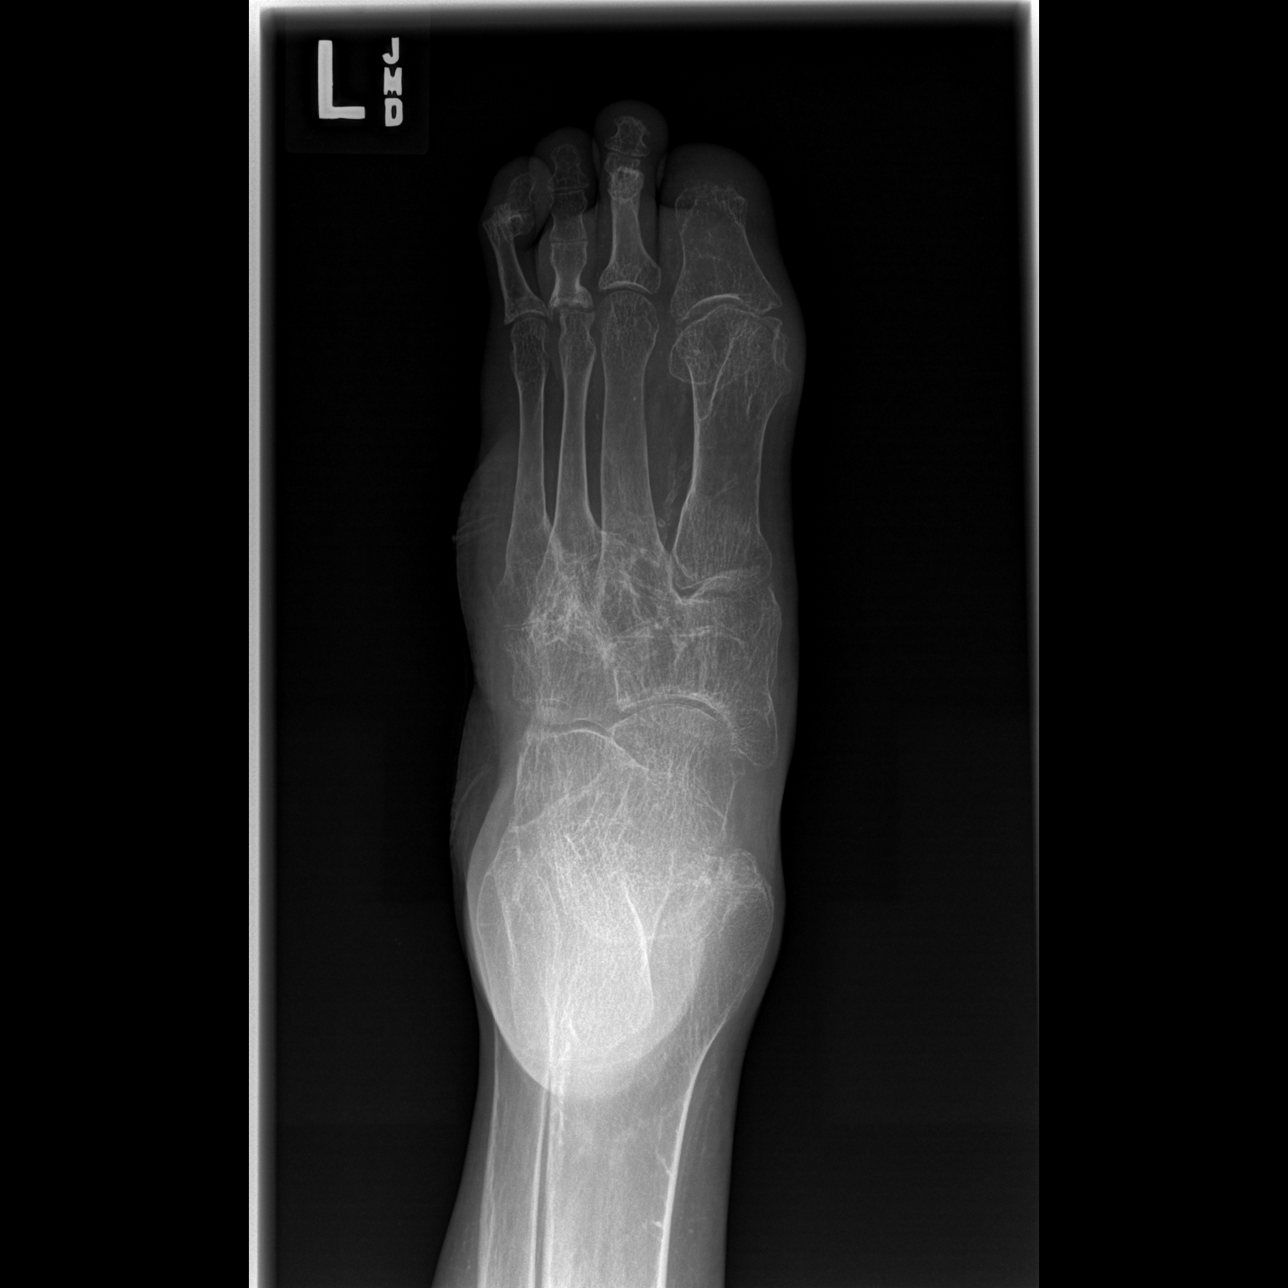

[3 of 3 positions shown; findings below may reference images not displayed]

FINDINGS: Marked osseous demineralization.

Ankle joint space preserved.

Extensive bone destruction in the midfoot compatible with
osteomyelitis and septic arthritis involving multiple bones and
joints as reported on foot radiographs.

Anterior margin of the inferior calcaneus is irregular question
osteomyelitis.

Medial malleolus appears better marginated on AP ankle radiograph
than on preceding foot radiograph.

Bone destruction at the talonavicular joint.

No additional acute abnormalities of the RIGHT ankle identified.
IMPRESSION: Extensive osteomyelitis and septic arthritis involving MTP joints
and numerous intertarsal joints of the RIGHT foot, including
talonavicular joint and question distal calcaneus.

No additional RIGHT ankle abnormalities.

The extent of changes involving the RIGHT foot could be better
defined by MR imaging, with and without contrast if patient's renal
function permits.

These results will be called to the ordering clinician or
representative by the Radiologist Assistant, and communication
documented in the PACS or zVision Dashboard.

## 2020-03-25 IMAGING — CR LEFT FOOT - COMPLETE 3+ VIEW
2 series · 2 of 2 positions shown · non-contrast
Comparison: 09/03/2017

CLINICAL DATA: Osteomyelitis of LEFT foot, multiple surgeries, type
I diabetes mellitus

EXAM:
LEFT FOOT - COMPLETE 3+ VIEW

[t foot oblique left *]
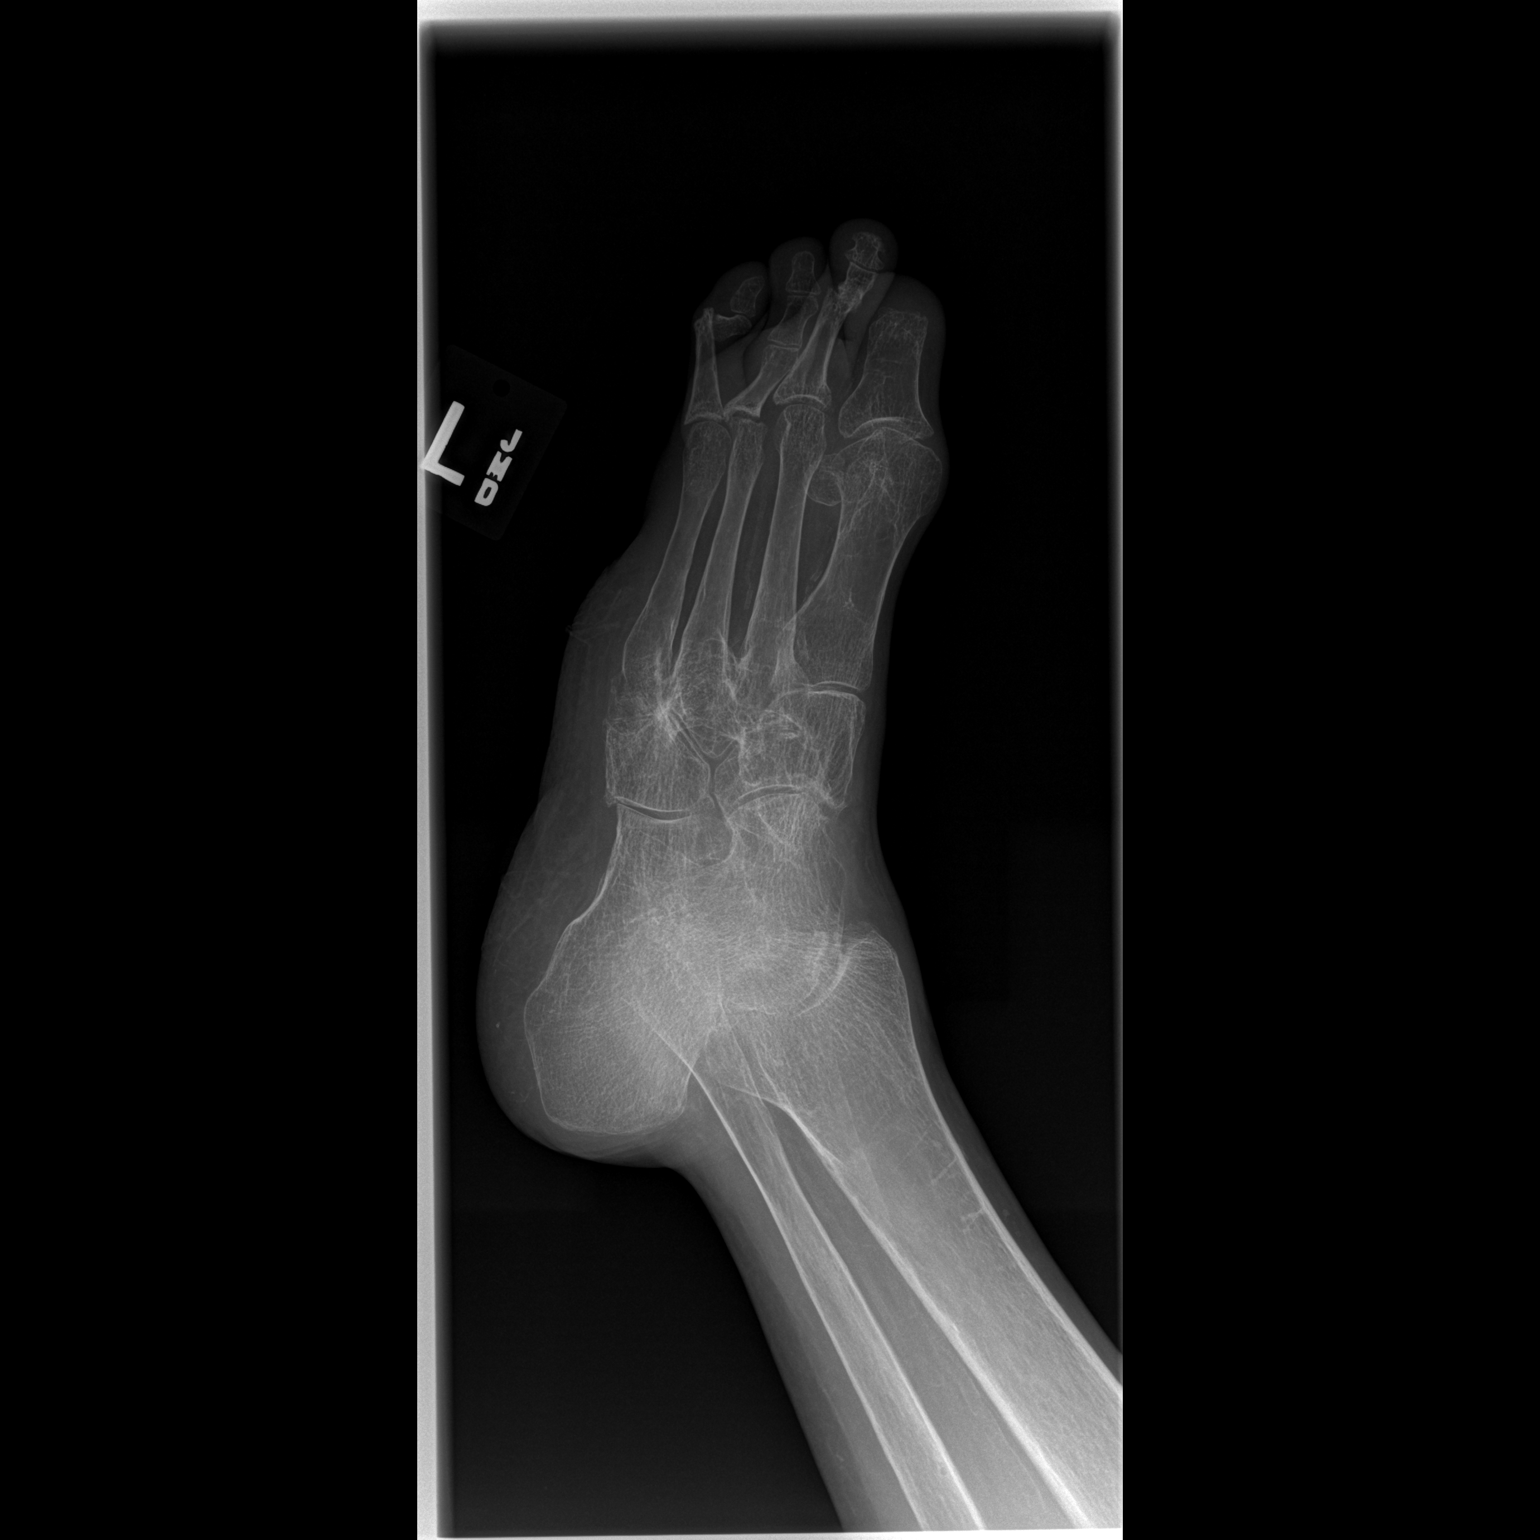

[t foot lat left]
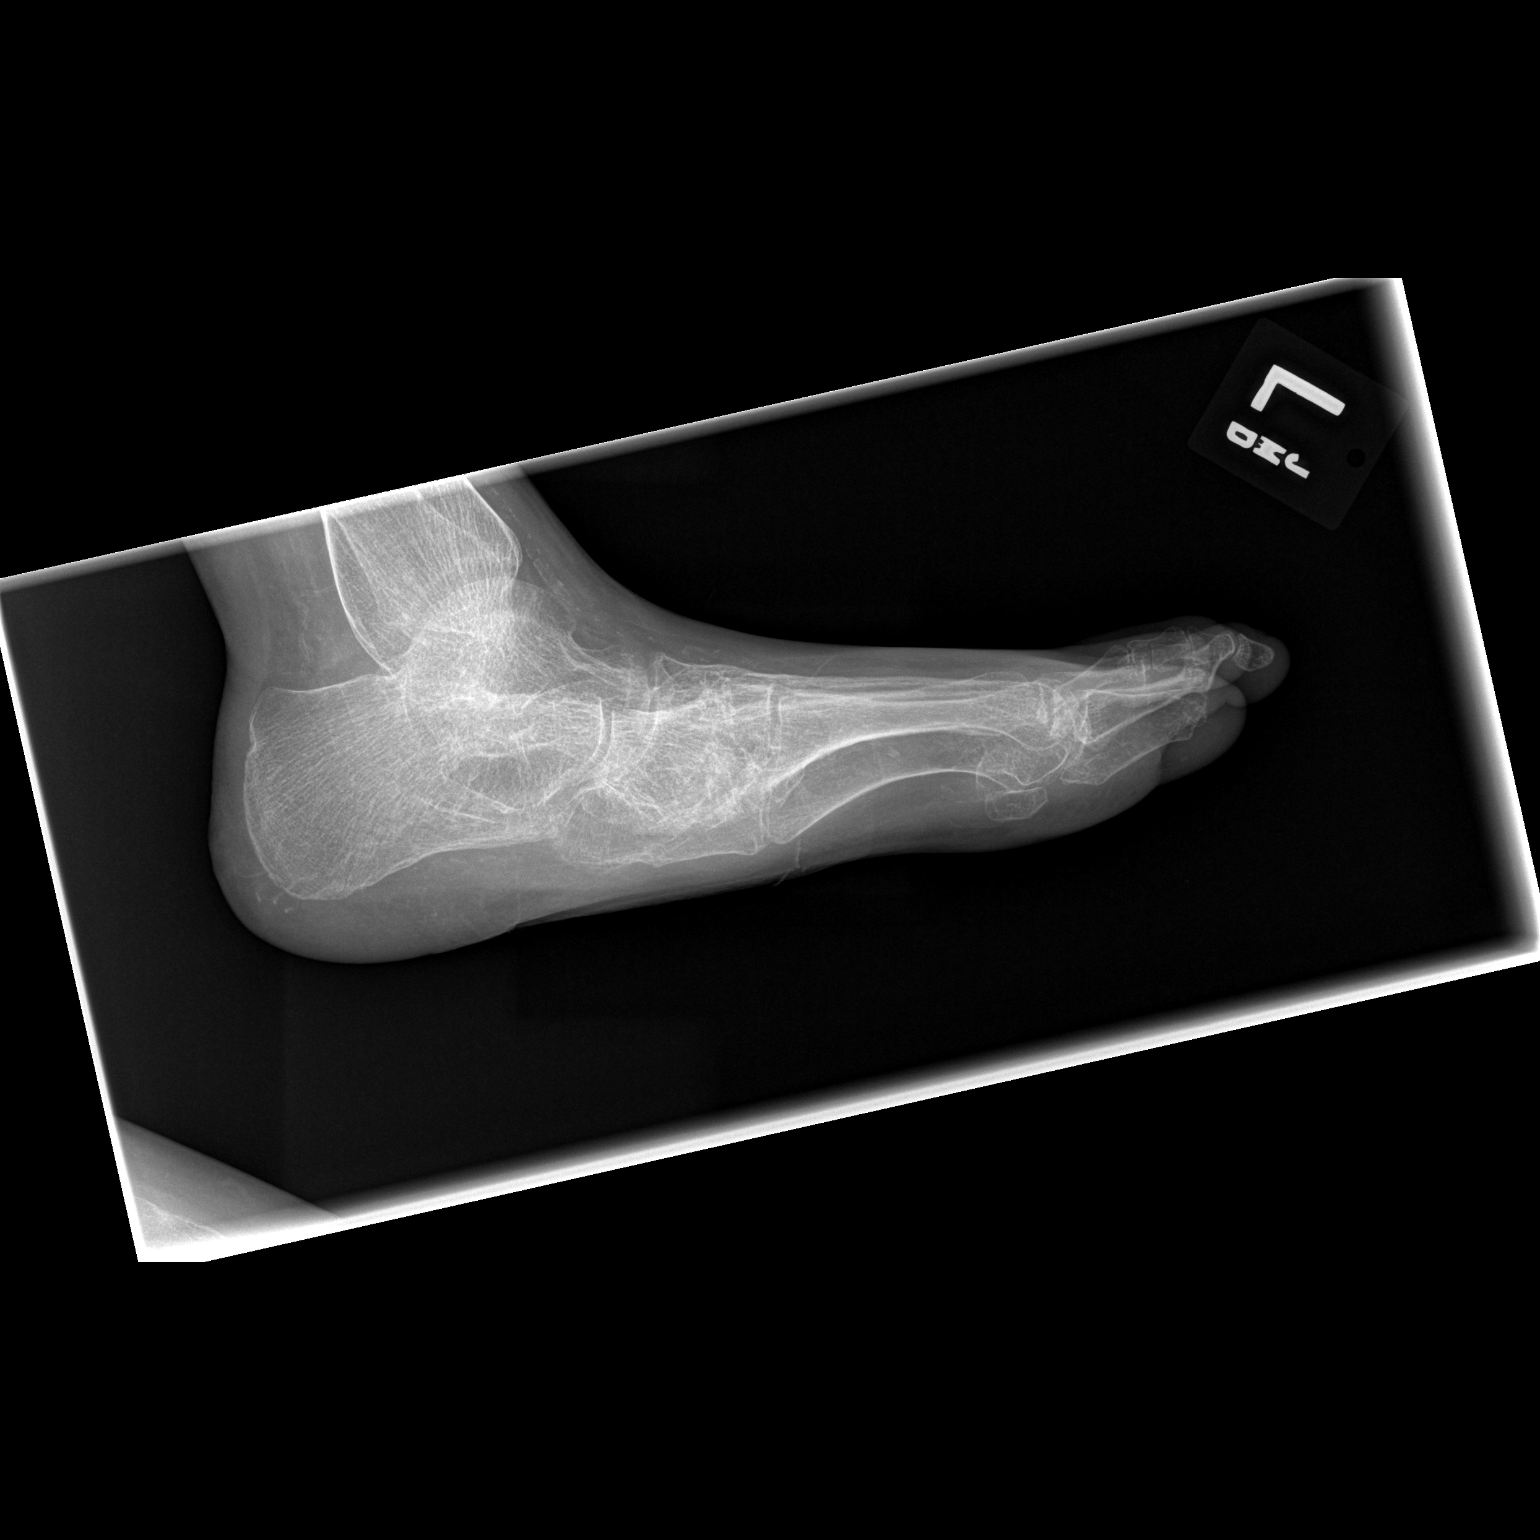

[2 of 2 positions shown; findings below may reference images not displayed]

FINDINGS: Osseous demineralization.

Prior amputation of the fifth ray.

Prior amputation of distal phalanx great toe.

Bone destruction and dislocation at PIP joint fourth toe.

Progressive bone destruction at the third MTP joint, corresponding
to acute infection on the previous exam.

Degenerative changes at PIP joint of the second toe as well as first
and fourth MTP joints.

No acute fracture, additional dislocation, or acute bone
destruction.

Scattered small vessel vascular calcifications.

Pes planus.
IMPRESSION: Progressive bone destruction and dislocation at PIP joint fourth
toe.

Degenerative changes at third MTP joint likely sequela of prior
infection.

Bone destruction at PIP joint of second toe, appeared fused on the
previous exam, question fracture with nonunion.

## 2020-03-25 IMAGING — CR RIGHT FOOT COMPLETE - 3+ VIEW
3 series · 3 of 3 positions shown · non-contrast
Comparison: 07/30/2018

CLINICAL DATA: Osteomyelitis

EXAM:
RIGHT FOOT COMPLETE - 3+ VIEW

[t foot ap right]
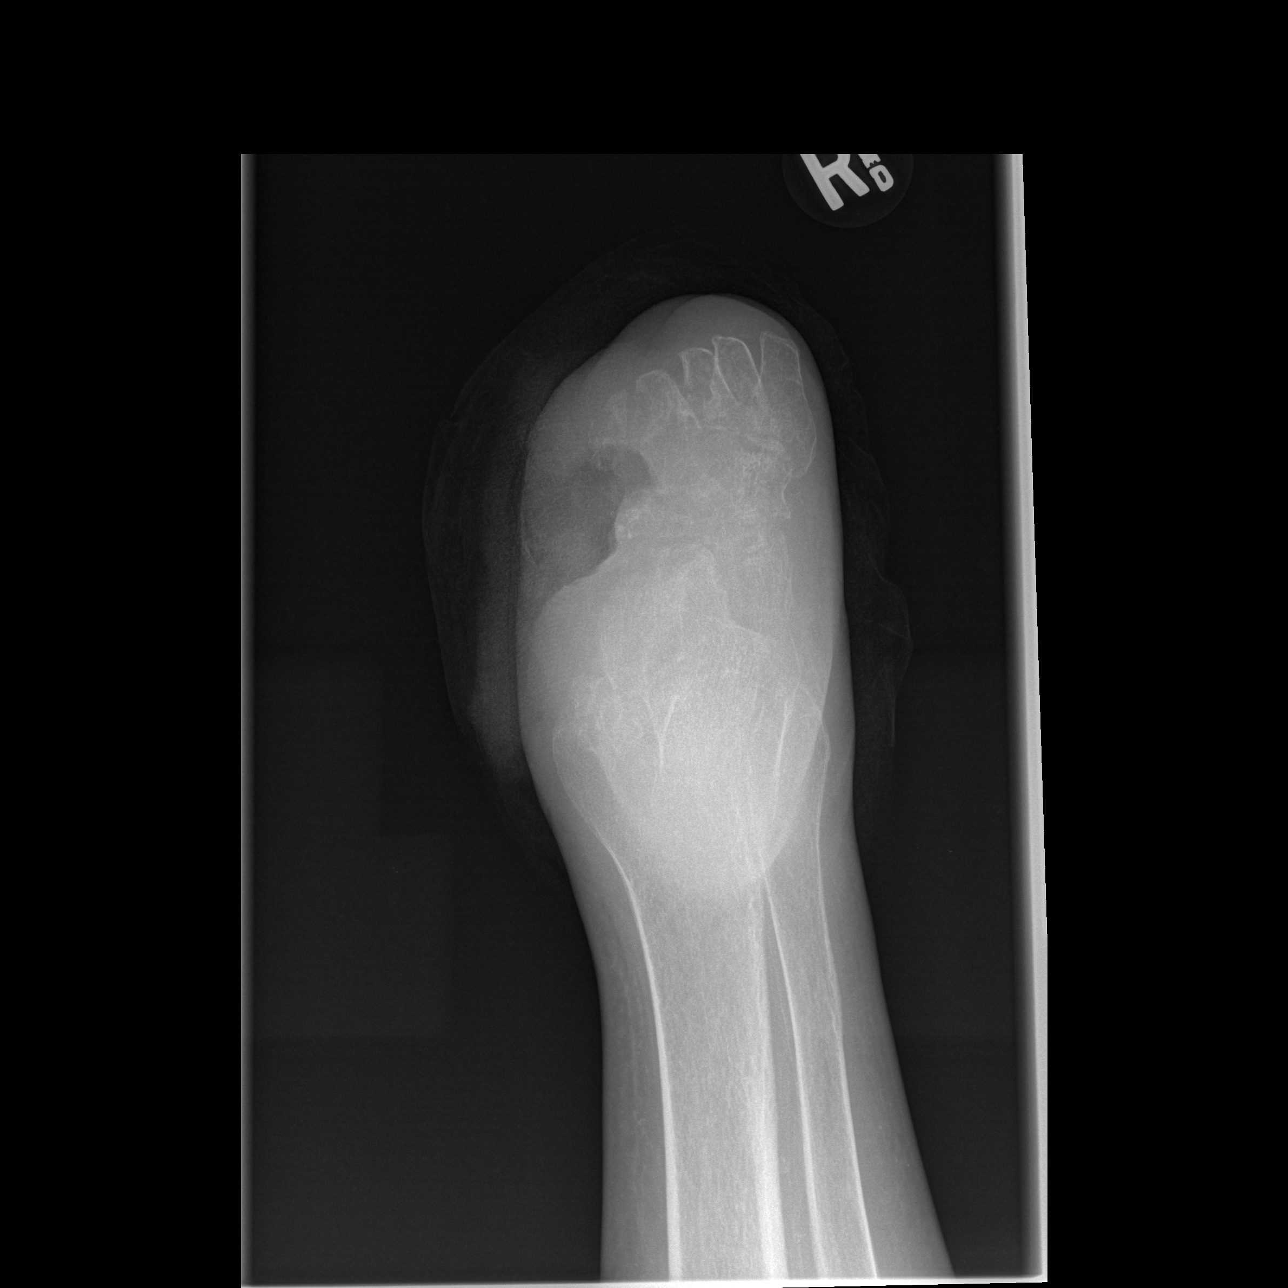

[t foot oblique right *]
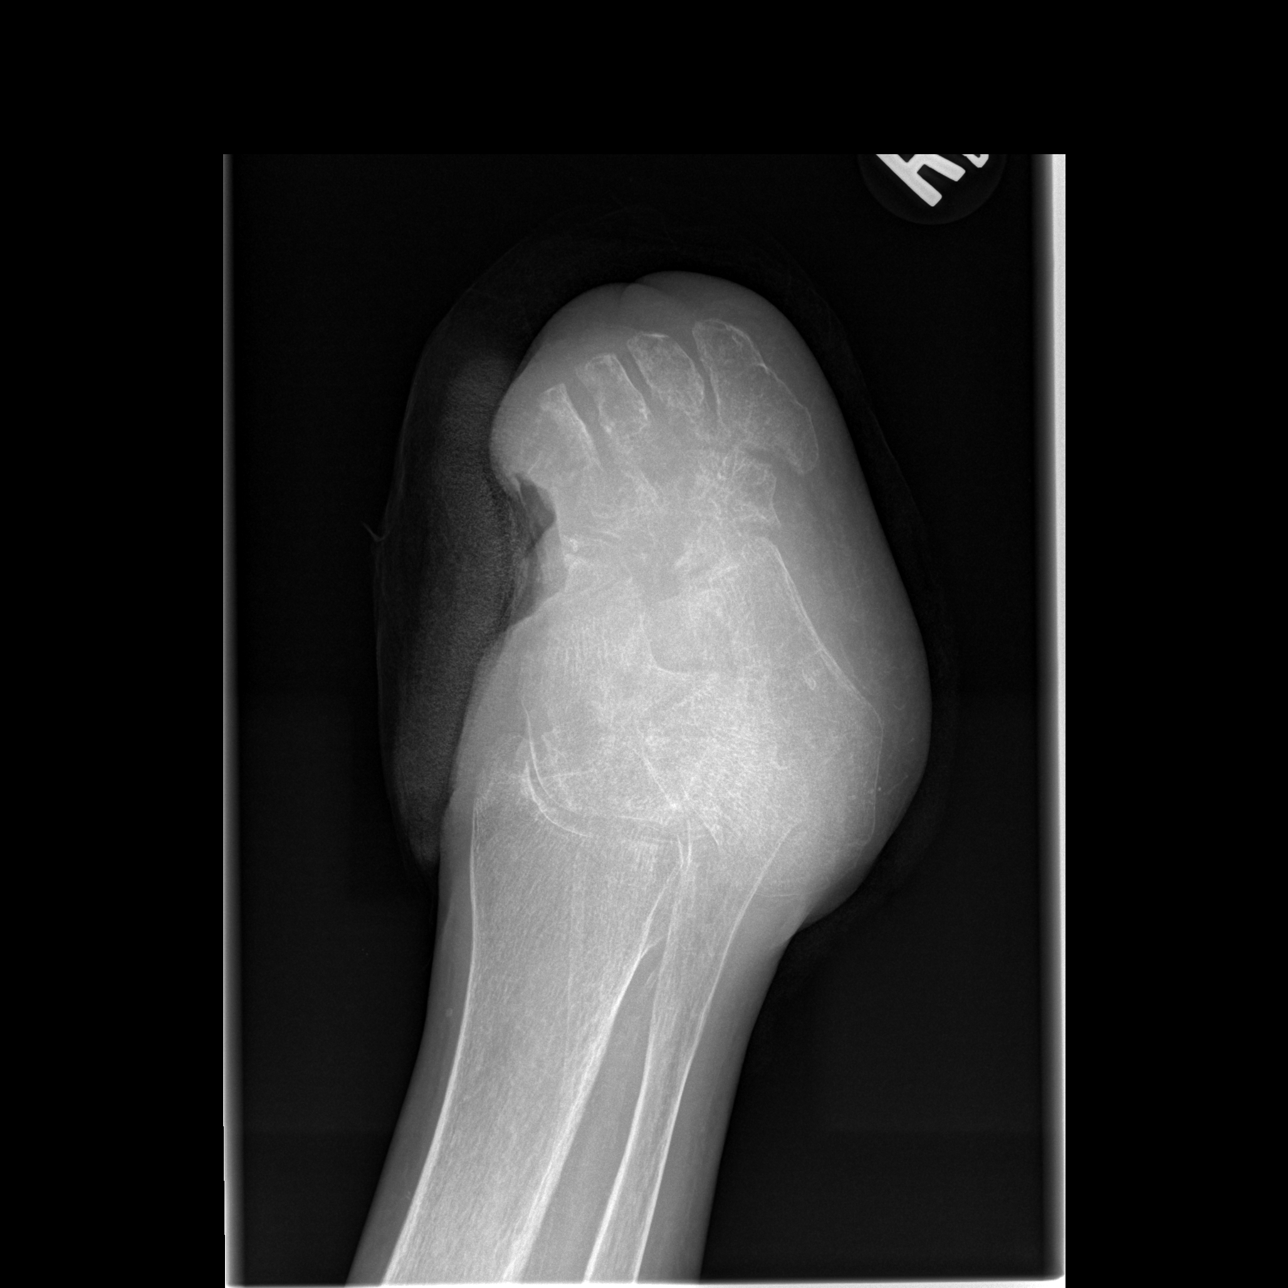

[t foot lat right *]
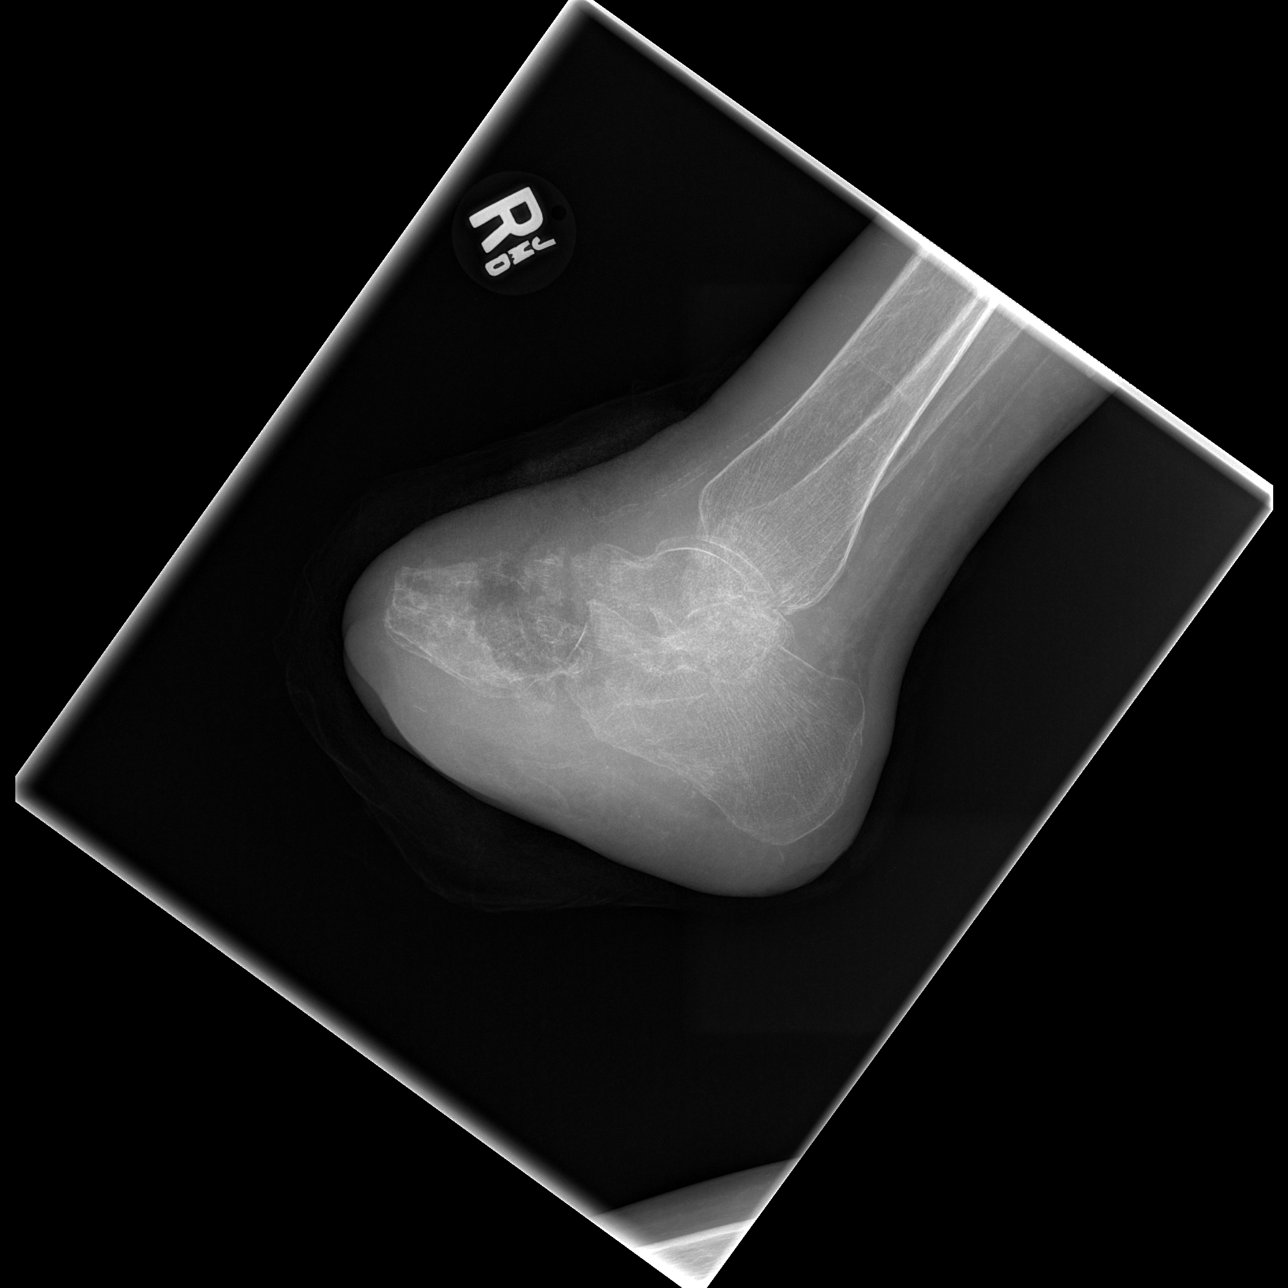

[3 of 3 positions shown; findings below may reference images not displayed]

FINDINGS: Large area of soft tissue lucency/ulcer at the medial aspect of the
RIGHT midfoot new since the previous exam.

Bone destruction of the base of the first and second metatarsals,
cuneiforms, tarsal navicular.

Additional bone destruction is seen at the bases of the second third
and fourth metatarsals.

Probable bone destruction at the distal talus, likely reflecting
infection involving the talonavicular joint.

Ankle joint space preserved.

Tip of the medial malleolus appears lucent and lacks definitive
cortication on this exam but appears to be grossly intact on AP
ankle radiograph.
IMPRESSION: Extensive bone destruction throughout the bases of the metatarsals,
cuneiform, tarsal navicular, and distal pole of talus, consistent
with widespread osteomyelitis and septic arthritis involving
multiple bones and joints of the mid RIGHT foot as above.

Large soft tissue ulcer at medial aspect of RIGHT foot.

These results will be called to the ordering clinician or
representative by the Radiologist Assistant, and communication
documented in the PACS or zVision Dashboard.

## 2020-04-08 ENCOUNTER — Encounter (HOSPITAL_BASED_OUTPATIENT_CLINIC_OR_DEPARTMENT_OTHER): Payer: Medicare HMO | Admitting: Internal Medicine

## 2020-04-10 ENCOUNTER — Other Ambulatory Visit: Payer: Self-pay | Admitting: Endocrinology

## 2020-04-10 ENCOUNTER — Emergency Department (HOSPITAL_BASED_OUTPATIENT_CLINIC_OR_DEPARTMENT_OTHER): Payer: Medicare HMO

## 2020-04-10 ENCOUNTER — Encounter (HOSPITAL_BASED_OUTPATIENT_CLINIC_OR_DEPARTMENT_OTHER): Payer: Self-pay | Admitting: Emergency Medicine

## 2020-04-10 ENCOUNTER — Other Ambulatory Visit: Payer: Self-pay

## 2020-04-10 ENCOUNTER — Emergency Department (HOSPITAL_BASED_OUTPATIENT_CLINIC_OR_DEPARTMENT_OTHER)
Admission: EM | Admit: 2020-04-10 | Discharge: 2020-04-10 | Disposition: A | Discharge status: Home / Self Care | Payer: Medicare HMO | Attending: Emergency Medicine | Admitting: Emergency Medicine

## 2020-04-10 DIAGNOSIS — Z7982 Long term (current) use of aspirin: Secondary | ICD-10-CM | POA: Diagnosis not present

## 2020-04-10 DIAGNOSIS — N390 Urinary tract infection, site not specified: Secondary | ICD-10-CM

## 2020-04-10 DIAGNOSIS — Z20822 Contact with and (suspected) exposure to covid-19: Secondary | ICD-10-CM | POA: Diagnosis not present

## 2020-04-10 DIAGNOSIS — Z87891 Personal history of nicotine dependence: Secondary | ICD-10-CM | POA: Insufficient documentation

## 2020-04-10 DIAGNOSIS — E1022 Type 1 diabetes mellitus with diabetic chronic kidney disease: Secondary | ICD-10-CM | POA: Diagnosis not present

## 2020-04-10 DIAGNOSIS — R109 Unspecified abdominal pain: Secondary | ICD-10-CM | POA: Diagnosis present

## 2020-04-10 DIAGNOSIS — N186 End stage renal disease: Secondary | ICD-10-CM | POA: Insufficient documentation

## 2020-04-10 DIAGNOSIS — I120 Hypertensive chronic kidney disease with stage 5 chronic kidney disease or end stage renal disease: Secondary | ICD-10-CM | POA: Insufficient documentation

## 2020-04-10 DIAGNOSIS — Z794 Long term (current) use of insulin: Secondary | ICD-10-CM | POA: Insufficient documentation

## 2020-04-10 DIAGNOSIS — Z992 Dependence on renal dialysis: Secondary | ICD-10-CM | POA: Insufficient documentation

## 2020-04-10 LAB — URINALYSIS, ROUTINE W REFLEX MICROSCOPIC
Bilirubin Urine: NEGATIVE
Glucose, UA: >=500 mg/dL — AB
Ketones, ur: NEGATIVE mg/dL
Nitrite: NEGATIVE
Protein, ur: >300 mg/dL — AB
Specific Gravity, Urine: 1.005 (ref 1.005–1.030)
pH: 8.5 — ABNORMAL HIGH (ref 5.0–8.0)

## 2020-04-10 LAB — LIPASE, BLOOD: Lipase: 38 U/L (ref 11–51)

## 2020-04-10 LAB — CBC
HCT: 29.2 % — ABNORMAL LOW (ref 36.0–46.0)
Hemoglobin: 9.5 g/dL — ABNORMAL LOW (ref 12.0–15.0)
MCH: 29.8 pg (ref 26.0–34.0)
MCHC: 32.5 g/dL (ref 30.0–36.0)
MCV: 91.5 fL (ref 80.0–100.0)
Platelets: 318 10*3/uL (ref 150–400)
RBC: 3.19 MIL/uL — ABNORMAL LOW (ref 3.87–5.11)
RDW: 15 % (ref 11.5–15.5)
WBC: 10 10*3/uL (ref 4.0–10.5)
nRBC: 0 % (ref 0.0–0.2)

## 2020-04-10 LAB — COMPREHENSIVE METABOLIC PANEL
ALT: 50 U/L — ABNORMAL HIGH (ref 0–44)
AST: 37 U/L (ref 15–41)
Albumin: 3.8 g/dL (ref 3.5–5.0)
Alkaline Phosphatase: 137 U/L — ABNORMAL HIGH (ref 38–126)
Anion gap: 17 — ABNORMAL HIGH (ref 5–15)
BUN: 66 mg/dL — ABNORMAL HIGH (ref 6–20)
CO2: 26 mmol/L (ref 22–32)
Calcium: 8.2 mg/dL — ABNORMAL LOW (ref 8.9–10.3)
Chloride: 94 mmol/L — ABNORMAL LOW (ref 98–111)
Creatinine, Ser: 5.55 mg/dL — ABNORMAL HIGH (ref 0.44–1.00)
GFR, Estimated: 9 mL/min — ABNORMAL LOW (ref >60)
Glucose, Bld: 174 mg/dL — ABNORMAL HIGH (ref 70–99)
Potassium: 4.6 mmol/L (ref 3.5–5.1)
Sodium: 137 mmol/L (ref 135–145)
Total Bilirubin: 0.7 mg/dL (ref 0.3–1.2)
Total Protein: 7.8 g/dL (ref 6.5–8.1)

## 2020-04-10 LAB — URINALYSIS, MICROSCOPIC (REFLEX)

## 2020-04-10 LAB — PREGNANCY, URINE: Preg Test, Ur: NEGATIVE

## 2020-04-10 LAB — RESP PANEL BY RT-PCR (FLU A&B, COVID) ARPGX2
Influenza A by PCR: NEGATIVE
Influenza B by PCR: NEGATIVE
SARS Coronavirus 2 by RT PCR: NEGATIVE

## 2020-04-10 MED ORDER — FENTANYL CITRATE (PF) 100 MCG/2ML IJ SOLN
100.0000 ug | Freq: Once | INTRAMUSCULAR | Status: AC
  Administered 2020-04-10: 100 ug via INTRAVENOUS
  Filled 2020-04-10: qty 2

## 2020-04-10 MED ORDER — ONDANSETRON HCL 4 MG PO TABS: 4.0000 mg | ORAL_TABLET | Freq: Three times a day (TID) | ORAL | 0 refills | Status: AC | PRN

## 2020-04-10 MED ORDER — ONDANSETRON HCL 4 MG/2ML IJ SOLN: 4.0000 mg | Freq: Once | INTRAMUSCULAR | Status: DC

## 2020-04-10 MED ORDER — FENTANYL CITRATE (PF) 100 MCG/2ML IJ SOLN: 50.0000 ug | Freq: Once | INTRAMUSCULAR | Status: DC

## 2020-04-10 MED ORDER — ONDANSETRON HCL 4 MG/2ML IJ SOLN
4.0000 mg | Freq: Once | INTRAMUSCULAR | Status: AC
  Administered 2020-04-10: 4 mg via INTRAVENOUS
  Filled 2020-04-10: qty 2

## 2020-04-10 MED ORDER — CEPHALEXIN 500 MG PO CAPS: 500.0000 mg | ORAL_CAPSULE | Freq: Two times a day (BID) | ORAL | 0 refills | Status: AC

## 2020-04-10 MED ORDER — CEPHALEXIN 250 MG PO CAPS
500.0000 mg | ORAL_CAPSULE | Freq: Once | ORAL | Status: AC
  Administered 2020-04-10: 500 mg via ORAL
  Filled 2020-04-10: qty 2

## 2020-04-10 NOTE — ED Triage Notes (Addendum)
Reports pain starts in L rib cage, radiates into RLQ and back. The pain started this morning. Also endorses vomiting and diarrhea x 4 days. She has dialysis T [facility] and did complete it yesterday.

## 2020-04-10 NOTE — ED Provider Notes (Signed)
Patient signed out to me at 3 PM.  Suspect possible UTI.  Feeling better after nausea medicine and pain medicine.  Extensive work-up overall unremarkable.  Suspect UTI/may be possibly passed kidney stone.  First dose of Keflex given in the ED.  Prescribe Zofran and Keflex.  Patient feeling much better and ready to go home.  This chart was dictated using voice recognition software.  Despite best efforts to proofread,  errors can occur which can change the documentation meaning.     Lennice Sites, DO 04/10/20 1621

## 2020-04-10 NOTE — Discharge Instructions (Addendum)
Take next dose of antibiotic tomorrow morning.  Take Zofran as needed for nausea.  Take Tylenol as needed for pain.

## 2020-04-21 ENCOUNTER — Other Ambulatory Visit: Payer: Self-pay | Admitting: Endocrinology

## 2020-04-22 ENCOUNTER — Other Ambulatory Visit: Payer: Self-pay

## 2020-04-22 ENCOUNTER — Encounter (HOSPITAL_BASED_OUTPATIENT_CLINIC_OR_DEPARTMENT_OTHER): Payer: Medicare HMO | Attending: Internal Medicine | Admitting: Internal Medicine

## 2020-04-22 DIAGNOSIS — E10621 Type 1 diabetes mellitus with foot ulcer: Secondary | ICD-10-CM | POA: Insufficient documentation

## 2020-04-22 DIAGNOSIS — Z95820 Peripheral vascular angioplasty status with implants and grafts: Secondary | ICD-10-CM | POA: Insufficient documentation

## 2020-04-22 DIAGNOSIS — L97411 Non-pressure chronic ulcer of right heel and midfoot limited to breakdown of skin: Secondary | ICD-10-CM | POA: Insufficient documentation

## 2020-04-22 DIAGNOSIS — Z87891 Personal history of nicotine dependence: Secondary | ICD-10-CM | POA: Diagnosis not present

## 2020-04-22 DIAGNOSIS — E1022 Type 1 diabetes mellitus with diabetic chronic kidney disease: Secondary | ICD-10-CM | POA: Insufficient documentation

## 2020-04-22 DIAGNOSIS — N189 Chronic kidney disease, unspecified: Secondary | ICD-10-CM | POA: Diagnosis not present

## 2020-04-22 DIAGNOSIS — Z89432 Acquired absence of left foot: Secondary | ICD-10-CM | POA: Insufficient documentation

## 2020-04-22 DIAGNOSIS — L97514 Non-pressure chronic ulcer of other part of right foot with necrosis of bone: Secondary | ICD-10-CM | POA: Insufficient documentation

## 2020-04-22 DIAGNOSIS — M86671 Other chronic osteomyelitis, right ankle and foot: Secondary | ICD-10-CM | POA: Diagnosis not present

## 2020-04-22 DIAGNOSIS — Z86718 Personal history of other venous thrombosis and embolism: Secondary | ICD-10-CM | POA: Diagnosis not present

## 2020-04-24 NOTE — ED Provider Notes (Signed)
[facility] EMERGENCY DEPARTMENT Provider Note   CSN: [id number] Arrival date & time: 04/10/20  1043     History Chief Complaint  Patient presents with  . Flank Pain    [name] is a 50 y.o. female.  HPI      50 year old female with history of ESRD on dialysis, DVT, hypertension, hyperlipidemia, peripheral vascular disease, bacteremia, type 1 diabetes, presents with flank and abdominal pain, nausea, vomiting and diarrhea.  Reports she may have had COVID exposure at a recent wedding.  She last had dialysis yesterday.  Reports symptoms have been going on for 3 to 4 days or worsening.  Pain at this time is severe.  She has not been able to keep down anything. No dysuria, does make urine. Severe fatigue. No cough.   Past Medical History:  Diagnosis Date  . Anemia   . Arthritis   . Bronchitis   . C. difficile diarrhea 09/26/2014  . Cataracts, both eyes    had surgery to remove  . Cellulitis of right foot 06/02/2014  . CKD (chronic kidney disease) stage 3, GFR 30-59 ml/min (HCC)    Dialysis T, TH, Sat  . Diabetic ulcer of right foot ([place]) 06/02/2014  . DVT (deep venous thrombosis) ([place]) 10/2014   one in each one in leg- PICC line , one in right and left arm  . H/O seasonal allergies   . Heart murmur    told once when she was pregnant- no furter mention- was in notes 11/2015- had Echo 8 /4/17  . History of blood transfusion   . Hypercholesteremia   . Hypertension   . Left foot infection   . Neuromuscular disorder ([place])    lower legs and feet  . Neuropathy    feet  . Neuropathy in diabetes ([place])   . Peripheral vascular disease ([place])   . Sleep apnea    does not use cpap  . Staphylococcus aureus bacteremia 10/2014  . Type 1 diabetes [facility])    onset age 23    Patient Active Problem List   Diagnosis Date Noted  . Allergy, unspecified, initial encounter 11/26/2019  . Anaphylactic shock, unspecified, initial encounter 11/26/2019  . Encounter for removal of  sutures 11/24/2019  . Chronic venous hypertension (idiopathic) with ulcer and inflammation of unspecified lower extremity ([place]) 09/08/2019  . ESRD (end stage renal disease) on dialysis ([place])   . Anemia in chronic kidney disease 08/04/2018  . Coagulation defect, unspecified ([place]) 08/04/2018  . Secondary hyperparathyroidism of renal origin ([place]) 08/04/2018  . Osteomyelitis of right foot ([place]) 07/29/2018  . Menorrhagia with irregular cycle 11/27/2017  . Gangrene of left foot ([place]) 05/08/2017  . Osteomyelitis of left foot ([place]) 05/08/2017  . Diabetic foot infection ([place])   . Diabetic foot ulcer ([place]) 05/05/2017  . Cellulitis 05/04/2017  . Diabetes mellitus type 1 with peripheral artery disease ([place]) 01/16/2017  . Anxiety 03/23/2016  . Compulsive skin picking 03/23/2016  . Sepsis due to methicillin resistant Staphylococcus aureus (MRSA) without acute organ dysfunction ([place]) 11/04/2015  . Heart murmur 11/03/2015  . Acute renal failure superimposed on stage 4 chronic kidney disease ([place]) 05/31/2015  . PAD (peripheral artery disease) ([place]) 04/22/2015  . Peripheral edema 12/28/2014  . Protein-calorie malnutrition, moderate ([place]) 10/18/2014  . History of DVT (deep vein thrombosis)   . MRSA bacteremia   . Diabetic infection of left foot ([place]) 10/13/2014  . Chronic anemia 10/13/2014  . Status post transmetatarsal amputation of right foot ([place]) 09/26/2014  .  Diabetes mellitus type 1, uncontrolled, insulin dependent ([place]) 11/09/2013  . DM type 1 causing renal disease ([place]) 06/13/2013  . CKD (chronic kidney disease), stage III ([place]) 06/12/2013  . Charcot foot due to diabetes mellitus ([place]) 05/09/2011  . Foot ulcer due to secondary DM ([place]) 05/09/2011  . Type 1 diabetes mellitus with neurological manifestations, uncontrolled ([place])   . Hypertension   . Hypercholesteremia     Past Surgical History:  Procedure Laterality Date  . AMPUTATION TOE Left 07/24/2015   5th toe and Hallux amputation  .  AV FISTULA PLACEMENT Left 08/06/2018   Procedure: INSERTION OF BASCILIC VEN TRANSPOSITION 1ST STAGE;  Surgeon: Rosetta Posner, MD;  Location: [facility];  Service: Vascular;  Laterality: Left;  . BASCILIC VEIN TRANSPOSITION Left 04/29/2019   Procedure: SECOND STAGE BASCILIC VEIN TRANSPOSITION LEFT ARM;  Surgeon: Rosetta Posner, MD;  Location: [facility];  Service: Vascular;  Laterality: Left;  . CESAREAN SECTION     x 1  . EXOSTECTECTOMY TOE Left 04/11/2016   Procedure: EXOSTECTECTOMY CUBOID AND 5TH METATARSAL AND PLACEMENT OF ANTIBIOTIC BEADS;  Surgeon: Edrick Kins, DPM;  Location: [facility];  Service: Podiatry;  Laterality: Left;  . EYE SURGERY Bilateral    removed cataracts - laser  . FEMORAL-POPLITEAL BYPASS GRAFT Left 05/03/2015  . FOOT AMPUTATION THROUGH METATARSAL Right 2016  . hemrrhoidectomy    . I & D EXTREMITY Right 06/10/2014   Procedure: IRRIGATION AND DEBRIDEMENT Right Foot;  Surgeon: Newt Minion, MD;  Location: [facility];  Service: Orthopedics;  Laterality: Right;  . I & D EXTREMITY Right 07/31/2018   Procedure: IRRIGATION AND DEBRIDEMENT EXTREMITY;  Surgeon: Edrick Kins, DPM;  Location: [facility];  Service: Podiatry;  Laterality: Right;  . I & D EXTREMITY Right 08/04/2018   Procedure: IRRIGATION AND DEBRIDEMENT FOOT with placement of wound vac;  Surgeon: Edrick Kins, DPM;  Location: [facility];  Service: Podiatry;  Laterality: Right;  . INCISION AND DRAINAGE Left 04/11/2016   Procedure: INCISION AND DRAINAGE A DEEP COMPLICATED WOUND OF LEFT FOOT;  Surgeon: Edrick Kins, DPM;  Location: [facility];  Service: Podiatry;  Laterality: Left;  . INSERTION OF DIALYSIS CATHETER Right 08/06/2018   Procedure: INSERTION OF TUNNELED DIALYSIS CATHETER;  Surgeon: Rosetta Posner, MD;  Location: [facility];  Service: Vascular;  Laterality: Right;  . IR FLUORO GUIDE CV LINE RIGHT  09/24/2017  . IR FLUORO GUIDE CV LINE RIGHT  08/01/2018  . IR REMOVAL TUN CV CATH W/O FL  11/22/2017  . IR US GUIDE VASC ACCESS RIGHT  09/24/2017  . IR US  GUIDE VASC ACCESS RIGHT  08/01/2018  . IRRIGATION AND DEBRIDEMENT ABSCESS Left 05/06/2017   Procedure: IRRIGATION AND DEBRIDEMENT ABSCESS LEFT FOOT;  Surgeon: Edrick Kins, DPM;  Location: [facility];  Service: Podiatry;  Laterality: Left;  . PERIPHERAL VASCULAR BALLOON ANGIOPLASTY Left 01/20/2020   Procedure: PERIPHERAL VASCULAR BALLOON ANGIOPLASTY;  Surgeon: Marty Heck, MD;  Location: [facility];  Service: Cardiovascular;  Laterality: Left;  Arm fistula  . PERIPHERALLY INSERTED CENTRAL CATHETER INSERTION    . SKIN GRAFT Right 06/15/2014  . SKIN GRAFT Left 06/2015   foot   . SKIN SPLIT GRAFT Right 06/15/2014   Procedure: SPLIT THICKNESS SKIN GRAFT RIGHT FOOT;  Surgeon: Newt Minion, MD;  Location: [facility];  Service: Orthopedics;  Laterality: Right;  . TRANSMETATARSAL AMPUTATION Right   . TRANSMETATARSAL AMPUTATION Right 09/11/2014  . WISDOM TOOTH EXTRACTION  OB History    Gravida  1   Para      Term      Preterm      AB      Living        SAB      IAB      Ectopic      Multiple      Live Births              Family History  Problem Relation Age of Onset  . Hyperlipidemia Mother   . Dementia Mother     Social History   Tobacco Use  . Smoking status: Former Smoker    Years: 15.00    Quit date: 05/16/2008    Years since quitting: 11.9  . Smokeless tobacco: Never Used  Vaping Use  . Vaping Use: Never used  Substance Use Topics  . Alcohol use: Not Currently    Comment: Occasional  . Drug use: No    Home Medications Prior to Admission medications   Medication Sig Start Date End Date Taking? Authorizing Provider  ondansetron (ZOFRAN) 4 MG tablet Take 1 tablet (4 mg total) by mouth every 8 (eight) hours as needed for up to 15 days for nausea or vomiting. 04/10/20 04/25/20 Yes Curatolo, Adam, DO  albuterol (PROVENTIL HFA;VENTOLIN HFA) 108 (90 BASE) MCG/ACT inhaler Inhale 2 puffs into the lungs every 6 (six) hours as needed for wheezing or  shortness of breath.     [provider]  aspirin EC 81 MG tablet Take 81 mg by mouth daily.    [provider]  B Complex-C-Zn-Folic Acid (DIALYVITE 073 WITH ZINC) 0.8 MG TABS Take 1 tablet by mouth daily. 03/09/19   [provider]  calcitRIOL (ROCALTROL) 0.5 MCG capsule Take 1 capsule daily Patient not taking: Reported on 11/04/2019 08/13/17   Elayne Snare, MD  calcium acetate (PHOSLO) 667 MG capsule Take 2 capsules (1,334 mg total) by mouth 3 (three) times daily with meals. 08/08/18   Danford, Suann Larry, MD  Continuous Blood Gluc Receiver ([place]) [place] 1 each by Does not apply route See admin instructions. Use Dexcom Receiver to monitor blood sugar continuously. 05/25/19   Elayne Snare, MD  Continuous Blood Gluc Sensor (DEXCOM G6 SENSOR) MISC 1 each by Does not apply route See admin instructions. Use one sensors once every 10 days to monitor blood sugars. 05/25/19   Elayne Snare, MD  Continuous Blood Gluc Transmit (DEXCOM G6 TRANSMITTER) MISC 1 each by Does not apply route See admin instructions. Use one transmitter once every 90 days. 05/25/19   Elayne Snare, MD  CONTOUR NEXT TEST test strip USE AS INSTRUCTED TO CHECK BLOOD SUGAR 4 TIMES A DAY. DX E10.65 06/08/19   Elayne Snare, MD  Cyanocobalamin (B-12) 5000 MCG CAPS Take 5,000 mcg by mouth daily.    [provider]  diphenhydrAMINE (BENADRYL) 25 MG tablet Take 25 mg by mouth daily as needed.     [provider]  folic acid (FOLVITE) 710 MCG tablet Take 400 mcg by mouth daily.    [provider]  insulin aspart (NOVOLOG) 100 UNIT/ML injection USE A MAX OF 65 UNITS DAILY VIA INSULIN PUMP. 04/21/20   Elayne Snare, MD  Insulin Human (INSULIN PUMP) SOLN Inject into the skin. Insulin aspart (Novolog) 100 unit/ml---MAX 65 UNITS DAILY    [provider]  norethindrone (AYGESTIN) 5 MG tablet Take 5 mg by mouth See admin instructions.  02/17/19   [provider]  Omega-3 Fatty Acids  (OMEGA 3 500) 500 MG CAPS Take 500 mg by mouth daily.    [provider]  pravastatin (PRAVACHOL) 40 MG tablet TAKE 1 TABLET BY MOUTH EVERY DAY Patient taking differently: Take 40 mg by mouth daily.  07/12/18   Elayne Snare, MD  sulfamethoxazole-trimethoprim (BACTRIM DS) 800-160 MG tablet Take 1 tablet by mouth daily.    [provider]    Allergies    Cleocin [clindamycin hcl], Lisinopril, Amoxicillin, and Bactrim [sulfamethoxazole-trimethoprim]  Review of Systems   Review of Systems  Constitutional: Positive for fatigue. Negative for fever.  HENT: Negative for sore throat.   Eyes: Negative for visual disturbance.  Respiratory: Negative for cough and shortness of breath.   Cardiovascular: Negative for chest pain.  Gastrointestinal: Positive for abdominal pain, diarrhea, nausea and vomiting. Negative for constipation.  Genitourinary: Positive for flank pain. Negative for difficulty urinating.  Musculoskeletal: Negative for back pain and neck pain.  Skin: Negative for rash.  Neurological: Negative for syncope and headaches.    Physical Exam Updated Vital Signs BP (!) 175/85 (BP Location: Right Arm)   Pulse 83   Temp 98.5 F (36.9 C) (Oral)   Resp 18   Ht 5\' 7"  (1.702 m)   Wt 68.9 kg   SpO2 99%   BMI 23.81 kg/m   Physical Exam Vitals and nursing note reviewed.  Constitutional:      General: She is not in acute distress.    Appearance: She is well-developed and well-nourished. She is ill-appearing. She is not diaphoretic.  HENT:     Head: Normocephalic and atraumatic.  Eyes:     Extraocular Movements: EOM normal.     Conjunctiva/sclera: Conjunctivae normal.  Cardiovascular:     Rate and Rhythm: Normal rate and regular rhythm.     Pulses: Intact distal pulses.  Pulmonary:     Effort: Pulmonary effort is normal. No respiratory distress.     Breath sounds: Normal breath sounds.  Abdominal:     General: There is no distension.     Palpations: Abdomen is  soft. Mass:       Tenderness: There is abdominal tenderness. There is left CVA tenderness. There is no guarding.  Musculoskeletal:        General: No tenderness or edema.     Cervical back: Normal range of motion.  Skin:    General: Skin is warm and dry.     Findings: No erythema or rash.  Neurological:     Mental Status: She is alert and oriented to person, place, and time.     ED Results / Procedures / Treatments   Labs (all labs ordered are listed, but only abnormal results are displayed) Labs Reviewed  COMPREHENSIVE METABOLIC PANEL - Abnormal; Notable for the following components:      Result Value   Chloride 94 (*)    Glucose, Bld 174 (*)    BUN 66 (*)    Creatinine, Ser 5.55 (*)    Calcium 8.2 (*)    ALT 50 (*)    Alkaline Phosphatase 137 (*)    GFR, Estimated 9 (*)    Anion gap 17 (*)    All other components within normal limits  CBC - Abnormal; Notable for the following components:   RBC 3.19 (*)    Hemoglobin 9.5 (*)    HCT 29.2 (*)    All other components within normal limits  URINALYSIS, ROUTINE W REFLEX MICROSCOPIC - Abnormal; Notable for the  following components:   Color, Urine STRAW (*)    APPearance CLOUDY (*)    pH 8.5 (*)    Glucose, UA >=500 (*)    Hgb urine dipstick SMALL (*)    Protein, ur >300 (*)    Leukocytes,Ua MODERATE (*)    All other components within normal limits  URINALYSIS, MICROSCOPIC (REFLEX) - Abnormal; Notable for the following components:   Bacteria, UA MANY (*)    All other components within normal limits  RESP PANEL BY RT-PCR (FLU A&B, COVID) ARPGX2  LIPASE, BLOOD  PREGNANCY, URINE    EKG None  Radiology No results found.  Procedures Procedures (including critical care time)  Medications Ordered in ED Medications  fentaNYL (SUBLIMAZE) injection 100 mcg (100 mcg Intravenous Given 04/10/20 1250)  ondansetron (ZOFRAN) injection 4 mg (4 mg Intravenous Given 04/10/20 1249)  cephALEXin (KEFLEX) capsule 500 mg (500 mg Oral  Given 04/10/20 1553)    ED Course  I have reviewed the triage vital signs and the nursing notes.  Pertinent labs & imaging results that were available during my care of the patient were reviewed by me and considered in my medical decision making (see chart for details).    MDM Rules/Calculators/A&P                          50 year old female with history of ESRD on dialysis, DVT, hypertension, hyperlipidemia, peripheral vascular disease, bacteremia, type 1 diabetes, presents with flank and abdominal pain, nausea, vomiting and diarrhea.    DDx includes DKA, COVID 36, appendicitis, pancreatitis, cholecystitis, pyelonephritis, nephrolithiasis, diverticulitis, PID, ovarian torsion, ectopic pregnancy, and tuboovarian abscess.  Labs show no evidence of DKA< no significant change from baseline.  No sign of pancreatitis or hepatitis.   CT shows findings consistent with infection or recently passed stone. Suspect infection by history, urinalysis, continuing discomfort.  Improved but having continuing nausea.  Plan to continue to treat symptoms and if tolerating po may be discharged for outpatient management of pyelonephritis with strict return precautions.    Final Clinical Impression(s) / ED Diagnoses Final diagnoses:  Urinary tract infection without hematuria, site unspecified    Rx / DC Orders ED Discharge Orders         Ordered    cephALEXin (KEFLEX) 500 MG capsule  2 times daily        04/10/20 1622    ondansetron (ZOFRAN) 4 MG tablet  Every 8 hours PRN        04/10/20 1622           Gareth [name], MD 04/24/20 [phone]

## 2020-04-27 ENCOUNTER — Encounter: Payer: Self-pay | Admitting: Cardiology

## 2020-04-27 DIAGNOSIS — R072 Precordial pain: Secondary | ICD-10-CM | POA: Insufficient documentation

## 2020-04-27 NOTE — Progress Notes (Addendum)
[name], [name] ([id number]) Visit Report for 04/22/2020 HPI Details Patient Name: Date of Service: [name] 04/22/2020 12:30 PM Medical Record Number: [id number] Patient Account Number: [id number] Date of Birth/Sex: Treating RN: [birth year] (50 y.o. [name] Primary Care Provider: [name], [name] Other Clinician: Referring Provider: Treating Provider/Extender: [name], [name] Weeks in Treatment: 15 History of Present Illness HPI Description: 50 year old diabetic who is known to have type 1 diabetes which is poorly controlled last hemoglobin A1c was 11%. She comes in with a ulcerated area on the left lateral foot which has been there for over 6 months. Was recently she has been treated by [name] of podiatry who saw her last on 05/28/2016. Review of his notes revealed that the patient had incision and drainage with placement of antibiotic beads to the left foot on 04/11/2016 for possible osteomyelitis of the cuboid bone. Over the last year she's had a history of amputation of the left fifth toe and a femoropopliteal popliteal bypass graft somewhere in April 2017. 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 His note [name] mentions that the patient has been referred to me for further wound care and possibly great candidate for hyperbaric oxygen therapy due to recurrent osteomyelitis. However we do not have any x-rays of biopsy reports confirming this. He has been on several antibiotics including Bactrim and most recently is on doxycycline for an MRSA. I understand, the patient was not a candidate for IV antibiotics as she has had previous PICC lines which resulted in blood clots in both arms. There was a x-ray report dated 04/04/2016 on [name] notes which showed evidence of fifth ray resection left foot with osteolytic changes noted to the fourth metatarsal and cuboid bone on the left. 06/13/2016 -- had a left foot x-ray which  showed no acute fracture or dislocation and no definite radiographic evidence of osteomyelitis. Advanced osteopenia was seen. 06/20/2016 -- she has noticed a new wound on the right plantar foot in the region where she had a callus before. 06/27/16- the patient did have her x-ray of the right foot which showed no findings to suggest osteomyelitis. She saw her endocrinologist, [name], yesterday. Her A1c in January was 11. He also indicates mismanagement and noncompliance regarding her diabetes. She is currently on Bactrim for a lip infection. She is complaining of nausea, vomiting and diarrhea. She is unable to articulate the exact orders or dosing of the Bactrim; it is unclear when she will complete this. 07/04/2016 -- results from [facility] of ABIs with ankle waveforms were noted from 02/14/2016. The examination done on 06/27/2015 showed noncompressible ABIs with the right being 1.45 and the left being 1.33. The present examination showed a right ABI of 1.19 on the left of 1.33. The conclusion was that right normal ABI in the lower extremity at rest however compared to previous study which was noncompressible ABI may be falsely elevated side suggesting medial calcification. The left ABI suggested medial calcification. 08/01/2016 -- the patient had more redness and pain on her right foot and did not get to come to see as noted she see her PCP or go to the ER and decided to take some leftover metronidazole which she had at home. As usual, the patient does report she feels and is rather noncompliant. 08/08/2016 -- -- x-ray of the right foot -- FINDINGS:Transmetatarsal amputation is noted. No bony destruction is noted to suggest osteomyelitis. IMPRESSION: No evidence of  osteomyelitis. Postsurgical changes are seen. MRI would be more sensitive for possible bony changes. Culture has grown Serratia Marcescens -- sensitive to Bactrim, ciprofloxacin, ceftazidime she was seen by [name] on  08/06/2016. He did not find any exposed bone, muscle, tendon, ligament or joint. There was no malodor and he did a excisional debridement in the office. ============ Old notes: 50 year old patient who is known to the wound clinic for a while had been away from the wound clinic since 09/01/2014. Over the last several months she has been admitted to various hospitals including [facility] [place]. She was treated for a right metatarsal osteomyelitis with a transmetatarsal amputation and this was done about 49 months ago. He has a small ulcerated area on the right heel and she continues to have an ulcerated area on the left plantar aspect of the foot. The patient was recently admitted to the [facility] between 7/12 and 10/18/2014. she was given 3 weeks of IV vancomycin and was to follow-up with her surgeons at [facility] and also took oral vancomycin for C. difficile colitis. 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 MRI of the left foot done on 10/14/2014 showed no abscess or osteomyelitis. 04/27/15; this is a patient we know from previous stays in the wound care center. She is a type I diabetic I am not sure of her control currently. Since the last time I saw her she is had a right transmetatarsal amputation and has no wounds on her right foot and has no open wounds. She is been followed at the wound care center at [facility] in [place]. She comes today with the desire to undergo hyperbaric treatment locally. Apparently one of her wound care providers in [place] has suggested hyperbarics. This is in response to an MRI from 04/18/15 that showed increased marrow signal and loss of the proximal  fifth metatarsal cortex evidence of osteomyelitis with likely early osteomyelitis in the cuboid bone as well. She has a large wound over the base of the fifth metatarsal. She also has a eschar over her the tips of her toes on 1,3 and 5. She does not have peripheral pulses and apparently is going for an angiogram tomorrow which seems reasonable. After this she is going to infectious disease at [facility]. They have been using Medihoney to the large wound on the lateral aspect of the left foot to. The patient has known Charcot deformity from diabetic neuropathy. She also has known diabetic PAD. Surprisingly I can't see that she has had any recent antibiotics, the patient states the last antibiotic she had was at the end of November for 10 days. I think this was in response to culture that showed group G strep although I'm not exactly sure where the culture was from. She is also had arterial studies on 03/29/15. This showed a right ABI of 1.4 that was noncompressible. Her left ABI was 0.73. 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 It was not felt that arterial inflow was adequate for healing of a foot ulcer. Her Doppler waveforms looked monophasic ===== READMISSION 02/28/17; this is in an now 50 year old woman we've had at several different occasions in this clinic. 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 She has  a remote ex-smoker. She was last seen in this clinic by Dr. Con Memos I think in May. More recently she is been followed by her podiatrist [name] an infectious disease Dr. Megan Salon. She has 2 open wounds the major one is over the right first metatarsal head she also has a wound on the left plantar foot. an MRI of the right foot on 01/01/17 showed a soft tissue ulcer along the plantar aspect of the first metatarsal base consistent with osteomyelitis of the first metatarsal stump. Dr. 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 According to the patient this is been open for slightly over a month. She has been on a combination of Cipro 500 twice a day, Zyvox 600 twice a day and Flagyl 500 3 times a day for over a month now as directed by Dr. Megan Salon. cultures of the right foot earlier this year showed MRSA in January and Serratia in May. January also had a few viridans strep. Recent x-rays of both feet were done and [name] office and I don't have these reports. The patient has known PAD and has a history of aleft femoropopliteal bypass in April 2017. 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 Dwyane Dee. In spite of this the last hemoglobin A1c I can see is 10.1 on 01/01/2017. She is being referred by 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 She is been using Medihoney to this area and also an area on the left midfoot. She is using healing sandals bilaterally. ABIs in this clinic at the left posterior tibial was 1.1 noncompressible on the right READMISSION Non invasive vascular NOVANT 5/18 Aftercare following surgery of the circulatory system Procedure Note - Interface, External Ris In - 08/13/2016 11:05 AM EDT Procedure: Examination consists of physiologic resting arterial pressures of the brachial and ankle arteries bilaterally with continuous wave Doppler waveform analysis. Previous: Previous exam performed on 02/14/16 demonstrated ABIs of Rt = 1.19 and Lt = 1.33. Right: ABI = non-compressible PT 1.47 DP. S/P transmet amputation. , Left: ABI = 1.52, 2nd digit pressure = 87 mmHg Conclusions: Right: ABI (>1.3) may be falsely elevated, suggesting medial calcification. Left: ABI (>1.3) may be falsely  elevated, suggesting medial calcification The patient is a now 50 year old type I diabetic is had multiple issues her graded to chronic diabetic foot ulcers. 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 We had her in the clinic lastin November. At that point she had wounds on her bilateral feet.she had wanted to try hyperbarics however the healogics review process denied her because she hadn't followed up with her vascular surgeon for her left femoropopliteal bypass. The bypass was done by Dr. Raul Del at [facility]. We made her a follow-up with Dr. Raul Del however she did not keep the appointment and therefore she was not approved The patient shows me a small wound on her left fourth metatarsal head on her phone. She developed rapid discoloration in the plantar aspect of the left foot and she was admitted to hospital from 2/2 through 05/10/17 with wet gangrene of the left foot osteomyelitis of the fourth metatarsal heads. She was admitted acutely ill with a temperature of 103.  She was started on broad-spectrum vancomycin and cefepime. On 05/06/17 she was taken to the OR by [name] her podiatric surgeon for an incision and drainage irrigation of the left foot wound. Cultures from this surgery revealed group be strep and anaerobes. she was seen by Dr.Xu of orthopedic surgery and scheduled for a below-knee amputation which she u refused. Ultimately she was discharged on Levaquin and Flagyl for one month. MRI 05/05/17 done while she was in the hospital showed abscess adjacent to the fourth metatarsal head and neck small abscess around the fourth flexor tendon. Inflammatory phlegmon and gas in the soft tissues along the lateral aspect of the fourth phalanx. Findings worrisome for osteomyelitis involving the fourth proximal and middle phalanx and also the third and fourth metatarsals. Finally the patient had actually shortly  before this followed up with Dr. Raul Del at no time on 04/29/17. He felt that her left femoropopliteal bypass was patent he felt that her left-sided toe pressures more than adequate for healing a wound on the left foot. This was before her acute presentation. 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 05/28/17; she is started hyperbarics. The patient tells me that for some reason she was not actually on Levaquin but I think on ciprofloxacin. She was on Flagyl. She only started her Levaquin yesterday due to some difficulty with the pharmacy and perhaps her sister picking it up. She has an appointment with [name] tomorrow and with infectious disease early next week. She has no new complaints 06/06/17; the patient continues in hyperbarics. She saw [name] on 05/29/17 who is her podiatric surgeon. He is elected for a transmetatarsal amputation on 06/27/17. I'm not sure at what level he plans to do this amputation. The patient is unaware She also saw Dr. Megan Salon of infectious disease who elected to continue her on current antibiotics I think this is ciprofloxacin and Flagyl. I'll need to clarify with her tomorrow if she actually has this. We're using silver alginate to the actual wound. Necrotic surface today with material under the flap of her foot. Original MRI showed abscesses as well as osteomyelitis of the proximal and middle fourth phalanx and the third and fourth metatarsal heads 06/11/17; patient continues in hyperbarics and continues on oral antibiotics. She is doing well. The wound looks better. The necrotic part of this under the flap in her superior foot also looks better. she is been to see [name]. I haven't had a chance to look at his note. Apparently he has put the transmetatarsal amputation on hold her request it is still planning to take her to the OR for debridement and product application ACEL. I'll see if I can find his note. I'll therefore leave product ordering/requests to [name]  for now. I was going to look at Dermagraft 06/18/17-she is here in follow-up evaluation for bilateral foot wounds. She continues with hyperbaric therapy. She states she has been applying manuka honey to the right plantar foot and alternate manuka honey and silver alginate to the left foot, despite our orders. We will continue with same treatment plan and she will follo up next week. 06/25/17; I have reviewed [name] last note from 3/11. She has operative debridement in 2 days' time. By review his note apparently they're going to place there is skin over the majority of this wound which is a good choice. She has a small satellite area at the most proximal part of this wound on the left plantar foot. The area on the right  plantar foot we've been using silver alginate and it is close to healing. 07/02/17; unfortunately the patient was not easily approved for [name] proposed surgery. I'm not completely certain what the issue is. She has been using silver alginate to the wound she has completed a first course of hyperbarics. She is still on Levaquin and Flagyl. I have really lost track of the time course here.I suspect she should have another week to 2 of antibiotics. I'll need to see if she is followed up with infectious disease Dr. Megan Salon 07/09/17; the patient is followed up with Dr. Megan Salon. 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 She continues on Levaquin and metronidazole continuing both of these for now I think she is been on fr about 6 weeks. She still has some drainage but no pain. No fever. Her had been plans for her to go to the OR for operative debridement with her podiatrist [name], I am not exactly sure where that is. I'll probably slip a note to [name] today. I note that she follows with Dr. Dwyane Dee of endocrinology. We have her recertified for hyperbaric oxygen. I have not heard about Dermagraft however I'll see if [name] is planning a  skin substitute as well 07/16/17; the patient tells me she is just about out of [place]. I'll need to check Dr. Hale Bogus last notes on this. She states she has plenty of Flagyl however. She comes in today complaining of pain in the right lateral foot which she said lasted for about a day. The wound on the right foot is actually much more medially. She also tells me that the [facility] cost a lot of pain in the left foot wound and she turned back to silver alginate. Finally Dermagraft has a $481 per application co-pay. She cannot afford this 07/23/17; patient arrives today with the wound not much smaller. There is not much new to add. She has not heard from [name] all try to put in a call to them today. She was asking about Dermagraft again and she has an over $856 per application co-pay she states that she would be willing to try to do a payment plan. I been tried to avoid this. We've been using silver alginate, I'll change to [facility] 07/30/17-She is here in follow-up evaluation for left foot ulcer. She continues hyperbaric medicine. The left foot ulcer is stable we will continue with same treatment plan 08/06/17; she is here for evaluation of her left foot ulcer. Currently being treated for hyperbarics or underlying osteomyelitis. She is completed antibiotics. The left foot ulcer is better smaller with healthier looking granulation. For various reasons I am not really clear on we never got her back to the OR with [name]. He did not respond to my secure text message. Nevertheless I think that surgery on this point is not necessary nor am I completely clear that a skin substitute is necessary The patient is complaining about pain on the outside of her right foot. 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 There is no erythema. She also states the foot is warm versus her other part of her upper leg and this is largely true. It is not totally clear to me what's causing this. She  thinks it's different from her usual neuropathy pain 08/13/17; she arrives in clinic today with a small wound which is superficial on her right first metatarsal head. 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 She tells [place] she was up on her feet over the Mother's Day celebration. The large wound is on the left foot. Continues with hyperbarics for underlying osteomyelitis. We're using Hydrofera Blue. She asked me today about where we were with Dermagraft. I had actually excluded this because of the co-pay however she wants to assume this therefore I'll recheck the co-pay an order for next week. 08/20/17; the patient agreed to accept the co-pay of the first Apligraf which we applied today. She is disappointed she is finishing hyperbarics will run this through the insurance on the extent of the foot infection and the extent of the wound that she had however she is already had 60 dive's. Dermagraft No. 1 08/27/17; Dermagraft No. 2. She is not eligible for any more hyperbaric treatments this month. She reports a fair amount of drainage and she actually changed to the external dressings without disturbing the direct contact layer 09/03/17; the patient arrived in clinic today with the wound superficially looking quite healthy. Nice vibrant red tissue with some advancing epithelialization although not as much adherence of the flap as I might like. However she noted on her own fourth toe some bogginess and she brought that to our attention. Indeed this was boggy feeling like a possibility of subcutaneous fluid. 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 She is not been unwell. We've been using Dermagraft 09/10/17; the culture that I did not last week was MRSA. She saw Dr. Megan Salon this morning who is going to start her on vancomycin. I had sent him a secure a text message yesterday. I also spoke with her podiatric surgeon  [name] about surgery on this foot the options for conserving a functional foot etc. Promised me he would see her and will make back consultation today. Paradoxically her actual wound on the plantar aspect of her left foot looks really quite good. I had given her 5 days worth of Baxdella to cover her for MRSA. Her MRI came back showing osteomyelitis within the third metatarsal shaft and head and base of the third and fourth proximal phalanx. She had extensive inflammatory changes throughout the soft tissue of the lateral forefoot. With an ill-defined fluid around the fourth metatarsal extending into the plantar and dorsal soft tissues 09/19/17; the patient is actually on oral Septra and Flagyl. She apparently refused IV vancomycin. She also saw [name] at my request who is planning her for a left BKA sometime in mid July. MRI showed osteomyelitis within the third metatarsal shaft and head and the basis of the third and fourth proximal phalanx. I believe there was felt to be possible septic arthritis involving the third MTP. 09/26/17; the patient went back to Dr. Megan Salon at my suggestion and is now receiving IV daptomycin. Her wound continues to look quite good making the decision to proceed with a transmetatarsal amputation although more difficult for the patient. I believe in my extensive discussions with her she has a good sense of the pros and cons of this. I don't NV the tuft decision she has to make. She has an appointment with [name] I believe in mid July and I previously spoken to him about this issue Has we had used 3 previous Dermagraft. Given the condition of the wound surface I went ahead and added the fourth one today area and I did this not fully realizing that she'll be traveling  to [place] next week. I'm hopeful she can come back in 2 weeks 10/21/17; Her same Dermagraft on for about 3-1/2 weeks. In spite of this the wound arrives looking quite healthy. There is been a lot of  healing dimensions are smaller. Looking at the square shaped wound she has now there is some undermining and some depth medially under the undermining although I cannot palpate any bone. No surrounding infection is obvious. She has difficult questions about how to look at this going forward vis--vis amputations versus continued medical therapy. T be truthful the wound is looks o so healthy and it is continued to contract. Hard to justify foot surgery at this point although I still told her that I think it might come to that if we are not able to eradicate the underlying MRSA. She is still highly at risk and she understands this 11/06/17 on evaluation today patient appears to be doing better in regard to her foot ulcer. She's been tolerating the dressing changes without complication. Currently she is here for her Dermagraft #6. Her wound continues to make excellent progress at this point. She does not appear to have any evidence of infection which is good news. 11/13/17 on evaluation today patient appears to be doing excellent at this time. She is here for repeat Dermagraft application. This is #7. Overall her wound seems to be making great progress. 12/05/17; the patient arrives with the wound in much better condition than when I last saw this almost 6 weeks ago. She still has a small probing area in the left metatarsal head region on the lateral aspect of her foot. We applied her last Dermagraft today. Since the last time she is here she has what appears to a been a blood blister on the plantar aspect of left foot although I don't see this is threatening. There is also a thick raised tissue on the right mid metatarsal head region. This was not there I don't think the last time she was here 3 weeks ago. 12/12/17; the patient continues to have a small programming area in the left metatarsal head region on the lateral aspect of her foot which was the initial large surgical wound. I applied her last Apligraf  last week. I'm going to use Endoform starting today Unfortunately she has an excoriated area in the left mid foot and the right mid foot. The left midfoot looks like a blistered area this was not opened last week it certainly is open today. Using silver alginate on these areas. She promises me she is offloading this. 12/19/17; the small probing area in the left metatarsal head eyes think is shallower. In general her original wound looks better. We've been using Endoform. The area inferiorly that I think was trauma last week still requires debridement a lot of nonviable surface which I removed. She still has an open open area distally in her foot Similarly on the right foot there is tightly adherent surface debris which I removed. Still areas that don't look completely epithelialized. This is a small open area. We used silver alginate on these areas 12/26/2017; the patient did not have the supplies we ordered from last week including the Endoform. The original large wound on the left lateral foot looks healthy. She still has the undermining area that is largely unchanged from last week. She has the same heavily callused raised edged wounds on the right mid and left midfoot. Both of these requiring debridement. We have been using silver alginate on these areas 01/02/2018;  there is still supply issues. We are going to try to use Prisma but I am not sure she actually got it from what she is saying. She has a new open area on the lateral aspect of the left fourth toe [previous fifth ray amputation]. Still the one tunneling area over the fourth metatarsal head. The area is in the midfoot bilaterally still have thick callus around them. She is concerned about a raised swelling on the lateral aspect of the foot. However she is completely insensate 01/10/2018; we are using Prisma to the wounds on her bilateral feet. Surprisingly the tunneling area over the left fourth metatarsal head that was part of  her original surgery has closed down. She has a small open area remaining on the incision line. 2 open areas in the midfoot. 02/10/2018; the patient arrives back in clinic after a month hiatus. She was traveling to visit family in [place]. Is fairly clear she was not offloading the areas on her feet. The original wound over the left lateral foot at the level of metatarsal heads is reopened and probes medially by about a centimeter or 2. She notes that a week ago she had purulent drainage come out of an area on the left midfoot. Paradoxically the worst area is actually on the right foot is extensive with purulent drainage. We will use silver alginate today 02/17/2018; the patient has 3 wounds one over the left lateral foot. She still has a small area over the metatarsal heads which is the remnant of her original surgical wound. This has medial probing depth of roughly 1.4 cm somewhat better than last week. The area on the right foot is larger. We have been using silver alginate to all areas. The area on the right foot and left foot that we cultured last week showed both Klebsiella and Proteus. Both of these are quinolone sensitive. The patient put her's self on Bactrim and Flagyl that she had left hanging around from prior antibiotic usages. She was apparently on this last week when she arrived. I did not realize this. Unfortunately the Bactrim will not cover either 1 of these organisms. We will send in Cipro 500 twice daily for a week 03/04/2018; the patient has 2 wounds on the left foot one is the original wound which was a surgical wound for a deep DFU. At one point this had exposed bone. She still has an area over the fourth metatarsal head that probes about 1.4 cm although I think this is better than last week. I been using silver nitrate to try and promote tissue adherence and been using silver alginate here. She also has an area in the left midfoot. This has some depth but a small linear wound.  Still requiring debridement. On the right midfoot is a circular wound. A lot of thick callus around this area. We have been using silver alginate to all wound areas She is completed the ciprofloxacin I gave her 2 weeks ago. 03/11/2018; the patient continues to have 2 open areas on the left foot 1 of which was the original surgical wound for a deep DFU. Only a small probing area remains although this is not much different from last week we have been using silver alginate. The other area is on the midfoot this is smaller linear but still with some depth. We have been using silver alginate here as well On the right foot she has a small circular wound in the mid aspect. This is not much smaller than last  time. We have been using silver alginate here as well 03/18/2018; she has 3 wounds on the left foot the original surgical wound, a very superficial wound in the mid aspect and then finally the area in the mid plantar foot. She arrives in today with a very concerning area in the wound in the mid plantar foot which is her most proximal wound. There is undermining here of roughly 1-1/2 cm superiorly. Serosanguineous drainage. She tells me she had some pain on for over the weekend that shot up her foot into her thigh and she tells me that she had a nodule in the groin area. She has the single wound in the right foot. We are using endoform to both wound areas 03/24/2018; the patient arrives with the original surgical wound in the area on the left midfoot about the same as last week. There is a collection of fluid under the surface of the skin extending from the surgical wound towards the midfoot although it does not reach the midfoot wound. The area on the right foot is about the same. Cultures from last week of the left midfoot wound showed abundant Klebsiella abundant Enterococcus faecalis and moderate methicillin resistant staph I gave her Levaquin but this would have only covered the Klebsiella. She  will need linezolid 04/01/2018; she is taking linezolid but for the first few days only took 1 a day. I have advised her to finish this at twice daily dosing. In any case all of her wounds are a lot better especially on the left foot. The original surgical wound is closed. The area on the left midfoot considerably smaller. The area on the right foot also smaller. 04/08/2018; her original surgical wound/osteomyelitis on the left foot remains closed. She has area on the left foot that is in the midfoot area but she had some streaking towards this. This is not connected with her original wound at least not visually. Small wound on the right midfoot appears somewhat smaller. 04/15/18; both wounds looks better. Original wound is better left midfoot. Using silver alginate 1/21; patient states she uses saltwater soak in, stones or remove callus from around her wounds. She is also concerned about a blood blister she had on the left foot but it simply resolved on its own. We've been using silver alginate 1/28; the patient arrives today with the same streaking area from her metatarsals laterally [the site of her original surgical wound] down to the middle of her foot. There is some drainage in the subcutaneous area here. This concerns me that there is actually continued ongoing infection in the metatarsals probably the fourth and third. This fixates an MRI of the foot without contrast [chronic renal failure] The wound in the mid part of the foot is small but I wonder whether this area actually connects with the more distal foot. The area on the right midfoot is probably about the same. Callus thick skin around the small wound which I removed with a curette we have been using silver alginate on both wound areas 2/4; culture I did of the draining site on the left foot last time grew methicillin sensitive staph aureus. MRI of the left foot showed interval resolution of the findings surrounding the third metatarsal  joint on the prior study consistent with treated osteomyelitis. Chronic soft tissue ulceration in the plantar and lateral aspect of the forefoot without residual focal fluid collection. No evidence of recurrent osteomyelitis. Noted to have the previous amputation of the distal first phalanx and fifth  ray MRI of the right foot showed no evidence of osteomyelitis I am going to treat the patient with a prolonged course of antibiotics directed against MSSA in the left foot 2/11; patient continues on cephalexin. She tells me she had nausea and vomiting over the weekend and missed 2 days. In general her foot looks much the same. She has a small open area just below the left fourth metatarsal head. A linear area in the left midfoot. Some discoloration extending from the inferior part of this into the left lateral foot although this appears to be superficial. She has a small area on the right midfoot which generally looks smaller after debridement 2/18; the patient is completing his cephalexin and has another 2 days. She continues to have open areas on the left and right foot. 2/25; she is now off antibiotics. The area on the left foot at the site of her original surgical wound has closed yet again. She still has open areas in the mid part of her foot however these appear smaller. The area on the right mid foot looks about the same. We have been using silver alginate She tells me she had a serious hypoglycemic spell at home. She had to have EMS called and get IV dextrose 3/3; disappointing on the left lateral foot large area of necrotic tissue surrounding the linear area. This appears to track up towards the same original surgical wound. Required extensive debridement. The area on the right plantar foot is not a lot better also using silver 3/12; the culture I did last time showed abundant enterococcus. I have prescribed Augmentin, should cover any unrecognized anaerobes as well. In addition there were a few  MRSA and Serratia that would not be well covered although I did not want to give her multiple antibiotics. She comes in today with a new wound in the right midfoot this is not connected with the original wound over her MTP a lot of thick callus tissue around both wounds but once again she said she is not walking on these areas 3/17-Patient comes in for follow-up on the bilateral plantar wounds, the right midfoot and the left plantar wound. Both these are heavily callused surrounding the wounds. We are continuing to use silver alginate, she is compliant with offloading and states she uses a wheelchair fairly often at home 3/24; both wound areas have thick callus. However things actually look quite a bit better here for the majority of her left foot and the right foot. 3/31; patient continues to have thick callused somewhat irritated looking tissue around the wounds which individually are fairly superficial. There is no evidence of surrounding infection. We have been using silver alginate however I change that to [facility] today 4/17; patient returns to clinic after having a scare with Covid she tested negative in her primary doctor's office. She has been using Hydrofera Blue. She does not have an open area on the right foot. On the left foot she has a small open area with the mid area not completely viable. She showed me pictures of what looks like a hemorrhagic blister from several days ago but that seems to have healed over this was on the lateral left foot 4/21; patient comes in to clinic with both her wounds on her feet closed. However over the weekend she started having pain in her right foot and leg up into the thigh. She felt as though she was running a low-grade fever but did not take her temperature. She took a  doxycycline that she had leftover and yesterday a single Septra and metronidazole. She thinks things feel somewhat better. 4/28; duplex ultrasound I ordered last week was negative  for DVT or superficial thrombophlebitis. She is completed the doxycycline I gave her. States she is still having a lot of pain in the right calf and right ankle which is no better than last week. She cannot sleep. She also states she has a temperature of up to 101, coughing and complaining of visual loss in her bilateral eyes. Apparently she was tested for Covid 2 weeks ago at [facility] and that was negative. Readmission: 09/03/18 patient presents back for reevaluation after having been evaluated at the end of April regarding erythema and swelling of her right lower extremity. Subsequently she ended up going to the hospital on 07/29/18 and was admitted not to be discharged until 08/08/18. Unfortunately it was noted during the time that she was in the hospital that she did have methicillin-resistant Staphylococcus aureus as the infection noted at the site. It was also determined that she did have osteomyelitis which appears to be fairly significant. She was treated with vancomycin and in fact is still on IV vancomycin at dialysis currently. This is actually slated to continue until 09/12/18 at least which will be the completion of the six weeks of therapy. Nonetheless based on what I'm seeing at this point I'm not sure she will be anywhere near ready to discontinue antibiotics at that time. Since she was released from the hospital she was seen by [name] who is her podiatrist on 08/27/18. His note specifically states that he is recommended that the patient needs of one knee amputation on the right as she has a life- threatening situation that can lead quickly to sepsis. The patient advised she would like to try to save her leg to which [name] apparently told her that this was against all medical advice. She also want to discontinue the Wound VAC which had been initiated due to the fact that she wasn't pleased with how the wound was looking and subsequently she wanted to pursue applying Medihoney at that time.  He stated that he did not believe that the right lower extremity was salvageable and that the patient understood but would still like to attempt hyperbaric option therapy if it could be of any benefit. She was therefore referred back to [place] for further evaluation. He plans to see her back next week. Upon inspection today patient has a significant amount purulent drainage noted from the wound at this point. The bone in the distal portion of her foot also appears to be extremely necrotic and spongy. When I push down on the bone it bubbles and seeps purulent drainage from deeper in the end of the foot. I do not think that this is likely going to heal very well at all and less aggressive surgical debridement were undertaken more than what I believe we can likely do here in our office. 09/12/2018; I have not seen this patient since the most recent hospitalization although she was in our clinic last week. I have reviewed some of her records from a complex hospitalization. 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 There is situation was complicated by MRSA bacteremia and acute on chronic renal failure now on dialysis. She is receiving vancomycin at dialysis. We started her on Dakin's wet-to-dry last week she is changing this daily. There is still purulent drainage coming out of her foot.  Although she is apparently "agreeable" to a below-knee amputation which is been suggested by multiple clinicians she wants this to be done in [place]. She apparently has a telehealth visit with that provider sometime in late Tye 6/24. I have told her I think this is probably too long. Nevertheless I could not convince her to allow a local doctor to perform BKA. 09/19/2018; the patient has a large necrotic area on the right anterior foot. She has had previous transmetatarsal amputations. Culture I did last week showed MRSA nothing else she is on vancomycin at dialysis. She  has continued leaking purulent drainage out of the distal part of the large circular wound on the right anterior foot. She apparently went to see Dr. 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 Somehow that translated into her being referred to plastic surgery for debridement of the area. I gather she basically refused amputation although I do not have a copy of Dr. Berenice Primas notes. The patient really wants to have a trial of hyperbaric oxygen. I agreed with initial assessment in this clinic that this was probably too far along to benefit however if she is going to have plastic surgery I think she would benefit from ancillary hyperbaric oxygen. The issue here is that the patient has benefited as maximally as any patient I have ever seen from hyperbaric oxygen therapy. Most recently she had exposed bone on the lateral part of her left foot after a surgical procedure and that actually has closed. She has eschared areas in both heels but no open area. She is remained systemically well. I am not optimistic that anything can be done about this but the patient is very clear that she wants an attempt. The attempt would include a wound VAC further debridements and hyperbaric oxygen along with IV antibiotics. 6/26; I put her in for a trial of hyperbaric oxygen only because of the dramatic response she has had with wounds on her left midfoot earlier this year which was a surgical wound that went straight to her bone over the metatarsal heads and also remotely the left third toe. We will see if we can get this through our review process and insurance. She arrives in clinic with again purulent material pouring out of necrotic bone on the top of the foot distally. There is also some concerning erythema on the front of the leg that we marked. It is bit difficult to tell how tender this is because of neuropathy. I note from infectious disease that she had her vancomycin extended. All the  cultures of these areas have shown MRSA sensitive to vancomycin. She had the wound VAC on for part of the week. The rest of the time she is putting various things on this including Medihoney, "ionized water" silver sorb gel etc. 7/7; follow-up along with HBO. She is still on vancomycin at dialysis. She has a large open area on the dorsal right foot and a small dark eschar area on her heel. There is a lot less erythema in the area and a lot less tenderness. From an infection point of view I think this is better. She still has a lot of necrosis in the remaining right forefoot [previous TMA] we are still using the wound VAC in this area 7/16; follow-up along with HBO. I put her on linezolid after she finished her vancomycin. We started this last Friday I gave her 2 weeks worth. I had the expectation that she would be operatively debrided by Dr. Marla Roe  but that still has not happened yet. Patient phoned the office this week. She arrives for review today after HBO. The distal part of this wound is completely necrotic. Nonviable pieces of tendon bone was still purulent drainage. Also concerning that she has black eschar over the heel that is expanding. I think this may be indicative of infection in this area as well. She has less erythema and warmth in the ankle and calf but still an abnormal exam 7/21 follow-up along with HBO. I will renew her linezolid after checking a CBC with differential monitoring her blood counts especially her platelets. She was supposed to have surgery yesterday but if I am reading things correctly this was canceled after her blood sugar was found to be over 500. I thought Dr. Marla Roe who called me said that they were sending her to the ER but the patient states that was not the case. 7/28. Follow-up along with HBO. She is on linezolid I still do not have any lab work from dialysis even though I called last week. The patient is concerned about an area on her left lateral  foot about the level of the base of her fifth metatarsal. I did not really see anything that ominous here however this patient is in [place] ability to point out problems that she is sensing and she has been accurate in the past Finally she received a call from Dr. Marla Roe who is referring her to another orthopedic surgeon stating that she is too booked up to take her to the operating room now. Was still using a wound VAC on the foot 8/3 -Follow-up after HBO, she is got another week of linezolid, she is to call ID for an appointment, x-rays of both feet were reviewed, the left foot x-ray with third MTP joint osteo- Right foot x-ray widespread osteo-in the right midfoot Right ankle x-ray does not show any active evidence of infection 8/11-Patient is seen after HBO, the wounds on the right foot appear to be about the same, the heel wound had some necrotic base over tendon that was debrided with a curette 8/21; patient is seen after HBO. The patient's wound on her dorsal foot actually looks reasonably good and there is substantial amount of epithelialization however the open area distally still has a lot of necrotic debris partially bone. I cannot really get a good sense of just how deep this probes under the foot. She has been pressuring me this week to order medical maggots through a company in [place] for her. The problem I have is there is not a defined wound area here. On the positive side there is no purulence. She has been to see infectious disease she is still on Septra DS although I have not had a chance to review their notes 8/28; patient is seen in conjunction with HBO. The wounds on her foot continued to improve including the right dorsal foot substantially the, the distal part of this wound and the area on the right heel. We have been using a wound VAC over this chronically. She is still on trimethoprim as directed by infectious disease 9/4; patient is seen in conjunction with HBO.  Right dorsal foot wound substantially anteriorly is better however she continues to have a deep wound in the distal part of this that is not responding. We have been using silver collagen under border foam Area on the right plantar medial heel seems better. We have been using Hydrofera Blue 12/12/18 on evaluation today patient appears to be  doing about the same with regard to her wound based on prior measurements. She does have some necrotic tissue noted on the lateral aspect of the wound that is going require a little bit of sharp debridement today. This includes what appears to be potentially either severely necrotic bone or tendon. Nonetheless other than that she does not appear to have any severe infection which is good news 9/18; it is been 2 weeks since I saw this wound. She is tolerating HBO well. Continued dramatic improvement in the area on the right dorsal foot. She still has a small wound on the heel that we have been using Hydrofera Blue. She continues with a wound VAC 9/24; patient has to be seen emergently today with a swelling on her right lateral lower leg. She says that she told Dr. Evette Doffing about this and also myself on a couple of occasions but I really have no recollection of this. She is not systemically unwell and her wound really looked good the last time I saw this. She showed this to providers at dialysis and she was able to verify that she was started on cephalexin today for 5 doses at dialysis. She dialyzes on Tuesday Thursday and Saturday. 10/2; patient is seen in conjunction with HBO. The area that is draining on the right anterior medial tibia is more extensive. Copious amounts of serosanguineous drainage with some purulence. We are still using the wound VAC on the original wound then it is stable. Culture I did of the original IandD showed MRSA I contacted dialysis she is now on vancomycin with dialysis treatments. I asked them to run a month 10/9; patient seen in  conjunction with HBO. She had a new spontaneous open area just above the wound on the right medial tibia ankle. More swelling on the right medial tibia. Her wound on the foot looks about the same perhaps slightly better. There is no warmth spreading up her leg but no obvious erythema. her MRI of the foot and ankle and distal tib-fib is not booked for next Friday I discussed this with her in great detail over multiple days. it is likely she has spreading infection upper leg at least involving the distal 25% above the ankle. She knows that if I refer her to orthopedics for infectious disease they are going to recommend amputation and indeed I am not against this myself. We had a good trial at trying to heal the foot which is what she wanted along with antibiotics debridement and HBO however she clearly has spreading infection [probably staph aureus/MRSA]. Nevertheless she once again tells me she wants to wait the left of the MRI. She still makes comments about having her amputation done in [place]. 10/19; arrives today with significant swelling on the lateral right leg. Last culture I did showed Klebsiella. Multidrug-resistant. Cipro was intermediate sensitivity and that is what I have her on pending her MRI which apparently is going to be done on Thursday this week although this seems to be moving back and forth. She is not systemically unwell. We are using silver alginate on her major wound area on the right medial foot and the draining areas on the right lateral lower leg 10/26; MRI showed extensive abscess in the anterior compartment of the right leg also widespread osteomyelitis involving osseous structures of the midfoot and portions of the hindfoot. Also suspicion for osteomyelitis anterior aspect of the distal medial malleolus. Culture I did of the purulence once again showed a multidrug-resistant Klebsiella. I have been  in contact with nephrology late last week and she has been started on  cefepime at dialysis to replace the vancomycin We sent a copy of her MRI report to Dr. Geroge Baseman in [place] who is an orthopedic surgeon. The patient takes great stock in his opinion on this. She says she will go to [place] to have her leg amputated if Dr. Geroge Baseman does not feel there is any salvage options. 11/2; she still is not talk to her orthopedic surgeon in [place]. Apparently he will call her at [phone] this afternoon. The quality of this is she has not allowed me to refer her anywhere. She has been told over and over that she needs this amputated but has not agreed to be referred. She tells me her blood sugar was 600 last night but she has not been febrile. 11/9; she never did got a call from the orthopedic surgeon in [place] therefore that is off the radar. We have arranged to get her see orthopedic surgery at [facility]. She still has a lot of draining purulence coming out of the new abscess in her right leg although that probably came from the osteomyelitis in her right foot and heel. Meanwhile the original wound on the right foot looks very healthy. Continued improvement. The issue is that the last MRI showed osteomyelitis in her right foot extensively she now has an abscess in the right anterior lower leg. There is nobody in [place] who will offer this woman anything but an amputation and to be honest that is probably what she needs. I think she still wants to talk about limb salvage although at this point I just do not see that. She has completed her vancomycin at dialysis which was for the original staph aureus she is still on cefepime for the more recent Klebsiella. She has had a long course of both of these antibiotics which should have benefited the osteomyelitis on the right foot as well as the abscess. 11/16; apparently [place] elective surgery is shut down because of COVID-19 pandemic. I have reached out to some contacts at [facility] to see if we can get her an  orthopedic appointment there. I am concerned about continually leaving this but for the moment everything is static. In fact her original large wound on this foot is closing down. It is the abscess on the right anterior leg that continues to drain purulent serosanguineous material. She is not currently on any antibiotics however she had a prolonged course of vancomycin [1 month] as well as cefepime for a month 02/24/2019 on evaluation today patient appears to be doing better than the last time I saw her. This is not a patient that I typically see. With that being said I am covering for Dr. Dellia Nims this week and again compared to when I last saw her overall the wounds in particular seem to be doing significantly better which is good news. With that being said the patient tells me several disconcerting things. She has not been able to get in to see anyone for potential debridement in regard to her leg wounds although she tells me that she does not think it is necessary any longer because she is taking care of that herself. She noticed a string coming out of the lower wound on her leg over the last week. The patient states that she subsequently decided that we must of pack something in there and started pulling the string out and as it kept coming and coming she realized this was likely her  tendon. With that being said she continued to remove as much of this as she could. She then I subsequently proceeded to using tubes of antibiotic ointment which she will stick down into the wound and then scored as much as she can until she sees it coming out of the other wound opening. She states that in doing this she is actually made things better and there is less redness and irritation. With regard to her foot wound she does have some necrotic tendon and tissue noted in one small corner but again the actual wound itself seems to be doing better with good granulation in general compared to my last evaluation. 12/7;  continued improvement in the wound on the substantial part of the right medial foot. Still a necrotic area inferiorly that required debridement but the rest of this looks very healthy and is contracting. She has 2 wounds on the right lateral leg which were her original drainage sites from her abscess but all of this looks a lot better as well. She has been using silver alginate after putting antibiotic biotic ointment in one wound and watching it come out the other. I have talked to her in some detail today. I had given her names of orthopedic surgeons at [facility] for second opinion on what to do about the right leg. I do not think the patient never called them. She has not been able to get a hold of the orthopedic surgeon in [place] that she had put a lot of faith in as being somebody would give her an opinion that she would trust. I talked to her today and said even if I could get her in to another orthopedic surgeon about the leg which she accept an amputation and she said she would not therefore I am not going to press this issue for the moment 12/14; continued improvement in his substantial wound on the right medial foot. There is still a necrotic area inferiorly with tightly adherent necrotic debris which I have been working on debriding each time she is here. She does not have an orthopedic appointment. Since last time she was here I looked over her cultures which were essentially MRSA on the foot wound and gram-negative rods in the abscess on the anterior leg. 12/21; continued improvement in the area on the right medial foot. She is not up on this much and that is probably a good thing since I do not know it could support continuous ambulation. She has a small area on the right lateral leg which were remanence of the IandD's I did because of the abscess. I think she should probably have prophylactic antibiotics I am going to have to look this over to see if we can make an intelligent  decision here. In the meantime her major wound is come down nicely. Necrotic area inferiorly is still there but looks a lot better 04/06/2019; she has had some improvement in the overall surface area on the right medial foot somewhat narrowedr both but somewhat longer. The areas on the right lateral leg which were initial IandD sites are superficial. Nothing is present on the right heel. We are using silver alginate to the wound areas 1/18; right medial foot somewhat smaller. Still a deep probing area in the most distal recess of the wound. She has nothing open on the right leg. She has a new wound on the plantar aspect of her left fourth toe which may have come from just pulling skin. The patient using Medihoney on  the wound on her foot under silver alginate. I cannot discourage her from this 2/1; 2-week follow-up using silver alginate on the right foot and her left fourth toe. The area on the right dorsal foot is contracted although there is still the deep area in the most distal part of the wound but still has some probing depth. No overt infection 2/15; 2-week follow-up. She continues to have improvement in the surface area on the dorsal right foot. Even the tunneling area from last time is almost closed. The area that was on the plantar part of her left fourth toe over the PIP is indeed closed 3/1; 2-week follow-up. Continued improvement in surface area. The original divot that we have been debriding inferiorly I think has full epithelialization although the epithelialization is gone down into the wound with probably 4 mm of depth. Even under intense illumination I am unable to see anything open here. The remanence of the wound in this area actually look quite healthy. We have been using silver alginate 3/15; 2-week follow-up. Unfortunately not as good today. She has a comma shaped wound on the dorsal foot however the upper part of this is larger. Under illumination debris on the surface She also  tells [place] that she was on her right leg 2 times in the last couple of weeks mostly to reach up for things above her head etc. She felt a sharp pain in the right leg which she thinks is somewhere from the ankle to the knee. The patient has neuropathy and is really uncertain. She cannot feel her foot so she does not think it was coming from there 3/29; 2-week follow-up. Her wound measures smaller. Surface of the wound appears reasonable. She is using silver alginate with underlying Medihoney. She has home health. X-rays I did of her tib-fib last time were negative although it did show arterial calcification 4/12; 2-week follow-up. Her wound measures smaller in length. Using manuka honey with silver alginate on top. She has home health. 4/26; 2-week follow-up. Her wound is smaller but still very adherent debris under illumination requiring debridement she has been using manuka honey with silver alginate. 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 Been using Hydrofera Blue. She is on suppressive Bactrim but apparently she has been taking it twice daily 6/7; I have not seen this wound and about 6 weeks. Since then she was up in [place]. By her own admission she was walking on the foot because she did not have a wheelchair. The wound is not nearly as healthy looking as it was the last time I saw this. We ordered different things for her but she only uses Medihoney and silver alginate. As far as I know she is on suppressive trimethoprim sulfamethoxazole. She does not admit to any fever or chills. Her CBGs apparently are at baseline however she is saying that she feels some discomfort on the lateral part of her ankle I looked over her last inflammatory markers from the summer 2020 at which time she had a deeply necrotic infected wound in this area. On 11/10/2018 her sedimentation rate was 56 and C-reactive  protein 9.9. This was 107 and 29 on 07/29/2018. 6/17; the patient had a necrotic wound the last time she was here on the right dorsal foot. After debridement I did a culture. This showed a very resistant ESBL Klebsiella as well as Enterococcus. Her  x-ray of the foot which was done because of warmth and some discomfort showed bone destruction within the carpal bones involving the navicular acute cuboid lateral middle cuneiforms but essentially unchanged from her prior study which was done on 10/29/2018. The findings were felt to represent chronic osteomyelitis. We did inflammatory markers on her. Her white count was 5.25 sedimentation rate 16 and C-reactive protein at 11.1. Notable for the fact that in August 2020 her CRP was 9.9 and sedimentation rate 56. I have looked at her x-rays. It is true that the bone destruction is very impressive however the patient came into this clinic for the wound on her right foot with pieces of bone literally falling out anteriorly with purulent material. I am not exactly sure I could have expected anything different. She has not been systemically unwell no fever chills or blood sugars have been reasonable. 6/28; she arrives with a right heel closed. The substantial area on the right anterior foot looks healthy. Much better looking surface. I think we can change to [facility] seems to help this previously. She is getting her antibiotics at dialysis she should be just about finished 7/9; changed to [facility] last week. Surface wound looks satisfactory not much change in surface area however. She is going to [place] state next week this is usually a difficult thing for this patient follow-up will be for 2 weeks. 7/23; using Hydrofera Blue. She returns from her trip and the wound looks surprisingly good. Usually when this patient goes on trips she comes back with a lot of problems with the wound. She is saying that she sometimes feels an episodic "crunching"  feeling on the lateral part of the foot. She is neuropathic and not feeling pain but wonders whether this could be a neuropathic dysesthesia. 11/13/19-Patient returns after 3 weeks, the wound itself is stable and patient states that there is nothing new going on she is on some extra anxiety medications and is resisting the temptation to pick at the dry skin around the wound. 9/20; patient has not been here in over a month and I have not seen her in 2 months. The wound in terms of size I think is about the same. There is no exposed bone. She has a nonviable surface on this. She is supposed to be using [facility] however she is also been using some form of honey preparation as well as a silver-based dressings. I do not think she has any pattern to this. 10/4; 2-week follow-up. Patient has been using some form of spray which she says has honey and silver to purchase this online she has been covering it with gauze. In spite of this the wound actually looks quite good. The deeper divot distally appears to be close down. There is a rim of epithelialization. 10/18; 2-week follow-up. Patient has been using her Hydrofera Blue covered with her silver honey spray that she got online. 11/1; 2-week follow-up. She is using Hydrofera Blue with a silver honey spray. Wound bed is measuring smaller. She has noticed that her foot is warmer on the right. She is concerned about infection. For a long period of time I had her on prophylactic trimethoprim sulfamethoxazole DS 1 tablet daily. She is asking for this to be restarted. The patient is walking on this foot because of repairs that are being done in a home her but her room is on the second floor she has to go up and down stairs. I have cautioned against this however as  usual she will do exactly what she wants to do 11/15; 2-week follow-up. She uses Hydrofera Blue with a silver/honey spray which I have never heard of. I think her wound looks about the same.  Some epithelialization. No evidence that this is infected. I think she is walking on this more than we agreed on. She is going on extensive vacation over Thanksgiving 12/3; 2-week follow-up. She is using Hydrofera Blue however over Thanksgiving she ran out of this and she is simply been using Medihoney. In spite of this her wound is smaller almost divided into 2 now. She traveled extensively over Thanksgiving and actually looks quite good in spite of this. Usually this is been a marker of problems for her 12/17; 2-week follow-up. She is using Hydrofera Blue. The wound is smaller. Debris on the surface of this is fibrinous. She is traveling to [place] over the holidays which never bodes well for her wounds. I think she is walking more on her feet then she is even willing to admit and she tells me she does walk 04/22/2020; patient has not been here in more than a month. Nevertheless her wounds do not look that bad. He has been using Medihoney. She has a single wound on the right dorsal foot this is superficial does not appear to be infected Electronic Signature(s) Signed: 10/24/2020 8:15:30 AM By: Linton Ham MD Entered By: Linton Ham on 08/01/2020 17:16:46 -------------------------------------------------------------------------------- Physical Exam Details Patient Name: Date of Service: GA Nancy Shi [name] 04/22/2020 12:30 PM Medical Record Number: [id number] Patient Account Number: [id number] Date of Birth/Sex: Treating RN: 1971-03-25 (50 y.o. [name] Primary Care Provider: [name], [name] Other Clinician: Referring Provider: Treating Provider/Extender: [name], [name] Weeks in Treatment: 94 Constitutional Patient is hypertensive.. Pulse regular and within target range for patient.Marland Kitchen Respirations regular, non-labored and within target range.. Temperature is normal and within the target range for the patient.Marland Kitchen Appears in no distress. Notes Wound exam;  the wound actually looks better than when I thought it was going to look. No mechanical debridement was required there is no evidence of surrounding infection. Under illumination and some debris on the surface Electronic Signature(s) Signed: 10/24/2020 8:15:30 AM By: Linton Ham MD Entered By: Linton Ham on 08/01/2020 17:18:48 -------------------------------------------------------------------------------- Physician Orders Details Patient Name: Date of Service: GA Nancy Shi [name] 04/22/2020 12:30 PM Medical Record Number: [id number] Patient Account Number: [id number] Date of Birth/Sex: Treating RN: January 19, 1971 (50 y.o. 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 Bathing/ Shower/ Hygiene May shower and wash wound with soap and water. Edema Control - Lymphedema / SCD / Other Bilateral Lower Extremities Elevate legs to the level of the heart or above for 30 minutes daily and/or when sitting, a frequency of: - throughout the day Avoid standing for long periods of time. Moisturize legs daily. - with dressing changes Off-Loading Other: - minimal weight bearing right foot Home Health New wound care orders this week; continue Home Health for wound care. May utilize  formulary equivalent dressing for wound treatment orders unless otherwise specified. Other Home Health Orders/Instructions: - Interim Wound Treatment Wound #43 - Foot Wound  Laterality: Right, Medial Cleanser: Wound Cleanser (Home Health) Every Other Day/30 Days Discharge Instructions: Cleanse the wound with wound cleanser or normal saline prior to applying a clean dressing using gauze sponges, not tissue or cotton balls. Prim Dressing: MediHoney Calcium Alginate Dressing 4x5 in Every Other Day/30 Days ary Discharge Instructions: Apply to wound bed as instructed Secondary Dressing: Woven Gauze Sponge, Non-Sterile 4x4 in [facility]) Every Other Day/30 Days Discharge Instructions: Apply over primary dressing as directed. Secured With: Elastic Bandage 4 inch (ACE bandage) (Home Health) Every Other Day/30 Days Discharge Instructions: Secure with ACE bandage as directed. Secured With: [employer], 4.5x3.1 (in/yd) [facility]) Every Other Day/30 Days Discharge Instructions: Secure with Kerlix as directed. Secured With: Paper Tape, 2x10 (in/yd) (Home Health) Every Other Day/30 Days Discharge Instructions: Secure dressing with tape as directed. Electronic Signature(s) Signed: 04/22/2020 4:10:40 PM By: Linton Ham MD Signed: 04/22/2020 4:31:51 PM By: Baruch Gouty RN, BSN Entered By: Baruch Gouty on 04/22/2020 13:16:23 -------------------------------------------------------------------------------- Problem List Details Patient Name: Date of Service: GA Nancy Shi [name] 04/22/2020 12:30 PM Medical Record Number: [id number] Patient Account Number: [id number] Date of Birth/Sex: Treating RN: Sep 08, 1970 (50 y.o. 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 limited to breakdown of 09/17/2019 No Yes skin E10.621 Type 1 diabetes mellitus with foot ulcer 09/24/2018 No Yes Inactive Problems ICD-10 Code Description Active Date Inactive Date L97.521 Non-pressure chronic ulcer of other part of left foot limited to breakdown of skin 04/20/2019 04/20/2019 L97.812 Non-pressure chronic ulcer of other part of right lower leg with fat layer exposed 02/24/2019 02/24/2019 Resolved Problems ICD-10 Code Description Active Date Resolved Date L02.415 Cutaneous abscess of right lower limb 12/25/2018 12/25/2018 Electronic Signature(s) Signed: 10/24/2020 8:15:30 AM By: Linton Ham MD Previous Signature: 04/22/2020 4:10:40 PM Version By: Linton Ham MD Previous Signature: 04/22/2020 4:31:51 PM Version By: Baruch Gouty RN, BSN Entered By: Linton Ham on 08/01/2020 17:14:47 -------------------------------------------------------------------------------- Progress Note Details Patient Name: Date of Service: [name] 04/22/2020 12:30 PM Medical Record Number: [id number] Patient Account Number: [id number] Date of Birth/Sex: Treating RN: 06-08-1970 (50 y.o. [name] Primary Care Provider: [name], [name] Other Clinician: Referring Provider: Treating Provider/Extender: [name], [name] Weeks in Treatment: 51 Subjective History of Present Illness (HPI) 50 year old diabetic who is known to have type 1 diabetes which is poorly controlled last hemoglobin A1c was 11%. She comes in with a ulcerated area on the left lateral foot which has been there for over 6 months. Was recently she has been treated by [name] of podiatry who saw her last on 05/28/2016. Review of his notes revealed that the patient had incision and drainage with placement of antibiotic beads to the left foot on 04/11/2016 for possible osteomyelitis of the cuboid bone. Over the last year  she's had a history of amputation of the left fifth toe and a femoropopliteal popliteal bypass graft somewhere in April 2017. 2 years  ago she's had a right transmetatarsal amputation. His note [name] mentions that the patient has been referred to me for further wound care and possibly great candidate for hyperbaric oxygen therapy due to recurrent osteomyelitis. However we do not have any x-rays of biopsy reports confirming this. He has been on several antibiotics including Bactrim and most recently is on doxycycline for an MRSA. I understand, the patient was not a candidate for IV antibiotics as she has had previous PICC lines which resulted in blood clots in both arms. There was a x-ray report dated 04/04/2016 on [name] notes which showed evidence of fifth ray resection left foot with osteolytic changes noted to the fourth metatarsal and cuboid bone on the left. 06/13/2016 -- had a left foot x-ray which showed no acute fracture or dislocation and no definite radiographic evidence of osteomyelitis. Advanced osteopenia was seen. 06/20/2016 -- she has noticed a new wound on the right plantar foot in the region where she had a callus before. 06/27/16- the patient did have her x-ray of the right foot which showed no findings to suggest osteomyelitis. She saw her endocrinologist, [name], yesterday. Her A1c in January was 11. He also indicates mismanagement and noncompliance regarding her diabetes. She is currently on Bactrim for a lip infection. She is complaining of nausea, vomiting and diarrhea. She is unable to articulate the exact orders or dosing of the Bactrim; it is unclear when she will complete this. 07/04/2016 -- results from [facility] of ABIs with ankle waveforms were noted from 02/14/2016. The examination done on 06/27/2015 showed noncompressible ABIs with the right being 1.45 and the left being 1.33. The present examination showed a right ABI of 1.19 on the left of 1.33. The  conclusion was that right normal ABI in the lower extremity at rest however compared to previous study which was noncompressible ABI may be falsely elevated side suggesting medial calcification. The left ABI suggested medial calcification. 08/01/2016 -- the patient had more redness and pain on her right foot and did not get to come to see as noted she see her PCP or go to the ER and decided to take some leftover metronidazole which she had at home. As usual, the patient does report she feels and is rather noncompliant. 08/08/2016 -- -- x-ray of the right foot -- FINDINGS:Transmetatarsal amputation is noted. No bony destruction is noted to suggest osteomyelitis. IMPRESSION: No evidence of osteomyelitis. Postsurgical changes are seen. MRI would be more sensitive for possible bony changes. Culture has grown Serratia Marcescens -- sensitive to Bactrim, ciprofloxacin, ceftazidime she was seen by [name] on 08/06/2016. He did not find any exposed bone, muscle, tendon, ligament or joint. There was no malodor and he did a excisional debridement in the office. ============ Old notes: 50 year old patient who is known to the wound clinic for a while had been away from the wound clinic since 09/01/2014. Over the last several months she has been admitted to various hospitals including [facility]. She was treated for a right metatarsal osteomyelitis with a transmetatarsal amputation and this was done about 49 months ago. He has a small ulcerated area on the right heel and she continues to have an ulcerated area on the left plantar aspect of the foot. The patient was recently admitted to the [facility] hospital group between 7/12 and 10/18/2014. she was given 3 weeks of IV vancomycin and was to follow-up with her surgeons at [facility] and also took oral vancomycin for C. difficile colitis.  Past medical history is significant for type 1 diabetes mellitus with neurological manifestations and  uncontrolled cellulitis, DVT of the left lower extremity, C. 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 MRI of the left foot done on 10/14/2014 showed no abscess or osteomyelitis. 04/27/15; this is a patient we know from previous stays in the wound care center. She is a type I diabetic I am not sure of her control currently. Since the last time I saw her she is had a right transmetatarsal amputation and has no wounds on her right foot and has no open wounds. She is been followed at the wound care center at [facility] in [place]. She comes today with the desire to undergo hyperbaric treatment locally. Apparently one of her wound care providers in [place] has suggested hyperbarics. This is in response to an MRI from 04/18/15 that showed increased marrow signal and loss of the proximal fifth metatarsal cortex evidence of osteomyelitis with likely early osteomyelitis in the cuboid bone as well. She has a large wound over the base of the fifth metatarsal. She also has a eschar over her the tips of her toes on 1,3 and 5. She does not have peripheral pulses and apparently is going for an angiogram tomorrow which seems reasonable. After this she is going to infectious disease at [facility]. They have been using Medihoney to the large wound on the lateral aspect of the left foot to. The patient has known Charcot deformity from diabetic neuropathy. She also has known diabetic PAD. Surprisingly I can't see that she has had any recent antibiotics, the patient states the last antibiotic she had was at the end of November for 10 days. I think this was in response to culture that showed group G strep although I'm not exactly sure where the culture was from. She is also had arterial studies on 03/29/15. This showed a right ABI of 1.4 that was noncompressible. Her left ABI was 0.73. There  was a suggestion of superficial femoral artery occlusion. It was not felt that arterial inflow was adequate for healing of a foot ulcer. Her Doppler waveforms looked monophasic ===== READMISSION 02/28/17; this is in an now 50 year old woman we've had at several different occasions in this clinic. 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 She has a remote ex-smoker. She was last seen in this clinic by Dr. Con Memos I think in May. More recently she is been followed by her podiatrist [name] an infectious disease Dr. Megan Salon. She has 2 open wounds the major one is over the right first metatarsal head she also has a wound on the left plantar foot. an MRI of the right foot on 01/01/17 showed a soft tissue ulcer along the plantar aspect of the first metatarsal base consistent with osteomyelitis of the first metatarsal stump. Dr. 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 According to the patient this is been open for slightly over a month. She has been on a combination of Cipro 500 twice a day, Zyvox 600 twice a day and Flagyl 500 3 times a day for over a month now as directed by Dr. Megan Salon. cultures of the right foot earlier this year showed MRSA in January and Serratia in May. January also had a few viridans strep. Recent x-rays of both feet were done and [name] office  and I don't have these reports. The patient has known PAD and has a history of aleft femoropopliteal bypass in April 2017. 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 Dwyane Dee. In spite of this the last hemoglobin A1c I can see is 10.1 on 01/01/2017. She is being referred by 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 She is been using Medihoney to this area and also an area on the left midfoot. She is using healing sandals bilaterally. ABIs in this clinic at the left posterior tibial was 1.1 noncompressible on the right READMISSION Non invasive vascular NOVANT 5/18 Aftercare following surgery of the circulatory system Procedure Note - Interface, External Ris In - 08/13/2016 11:05 AM EDT Procedure: Examination consists of physiologic resting arterial pressures of the brachial and ankle arteries bilaterally with continuous wave Doppler waveform analysis. Previous: Previous exam performed on 02/14/16 demonstrated ABIs of Rt = 1.19 and Lt = 1.33. Right: ABI = non-compressible PT 1.47 DP. S/P transmet amputation. , Left: ABI = 1.52, 2nd digit pressure = 87 mmHg Conclusions: Right: ABI (>1.3) may be falsely elevated, suggesting medial calcification. Left: ABI (>1.3) may be falsely elevated, suggesting medial calcification The patient is a now 50 year old type I diabetic is had multiple issues her graded to chronic diabetic foot ulcers. 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 We had her in the clinic lastin November. At that point she had wounds on her bilateral feet.she had wanted to try hyperbarics however the healogics review process denied her because she hadn't followed up with her vascular surgeon for her left femoropopliteal bypass. The bypass was done by Dr. Raul Del at [facility]. We made her a follow-up with Dr. Raul Del however she did not keep the appointment and therefore she was not approved The patient shows me a small wound on her left fourth metatarsal head on her phone. She developed rapid discoloration in the plantar aspect of the left foot and she was admitted to hospital from 2/2 through 05/10/17 with wet gangrene of the left foot osteomyelitis of the fourth metatarsal heads. She  was admitted acutely ill with a temperature of 103. She was started on broad-spectrum vancomycin and cefepime. On 05/06/17 she was taken to the OR by [name] her podiatric surgeon for an incision and drainage irrigation of the left foot wound. Cultures from this surgery revealed group be strep and anaerobes. she was seen by Dr.Xu of orthopedic surgery and scheduled for a below-knee amputation which she u refused. Ultimately she was discharged on Levaquin and Flagyl for one month. MRI 05/05/17 done while she was in the hospital showed abscess adjacent to the fourth metatarsal head and neck small abscess around the fourth flexor tendon. Inflammatory phlegmon and gas in the soft tissues along the lateral aspect of the fourth phalanx. Findings worrisome for osteomyelitis involving the fourth proximal and middle phalanx and also the third and fourth metatarsals. Finally the patient had actually shortly before this followed up with Dr. Raul Del at no time on 04/29/17. He felt that her left femoropopliteal bypass was patent he felt that her left-sided toe pressures more than adequate for healing a wound on the left foot. This was before her acute presentation. Her noninvasive diabetes  are listed above. 05/28/17; she is started hyperbarics. The patient tells me that for some reason she was not actually on Levaquin but I think on ciprofloxacin. She was on Flagyl. She only started her Levaquin yesterday due to some difficulty with the pharmacy and perhaps her sister picking it up. She has an appointment with [name] tomorrow and with infectious disease early next week. She has no new complaints 06/06/17; the patient continues in hyperbarics. She saw [name] on 05/29/17 who is her podiatric surgeon. He is elected for a transmetatarsal amputation on 06/27/17. I'm not sure at what level he plans to do this amputation. The patient is unaware ooShe also saw Dr. Megan Salon of infectious disease who elected to continue her  on current antibiotics I think this is ciprofloxacin and Flagyl. I'll need to clarify with her tomorrow if she actually has this. We're using silver alginate to the actual wound. Necrotic surface today with material under the flap of her foot. ooOriginal MRI showed abscesses as well as osteomyelitis of the proximal and middle fourth phalanx and the third and fourth metatarsal heads 06/11/17; patient continues in hyperbarics and continues on oral antibiotics. She is doing well. The wound looks better. The necrotic part of this under the flap in her superior foot also looks better. she is been to see [name]. I haven't had a chance to look at his note. Apparently he has put the transmetatarsal amputation on hold her request it is still planning to take her to the OR for debridement and product application ACEL. I'll see if I can find his note. I'll therefore leave product ordering/requests to [name] for now. I was going to look at Dermagraft 06/18/17-she is here in follow-up evaluation for bilateral foot wounds. She continues with hyperbaric therapy. She states she has been applying manuka honey to the right plantar foot and alternate manuka honey and silver alginate to the left foot, despite our orders. We will continue with same treatment plan and she will follo up next week. 06/25/17; I have reviewed [name] last note from 3/11. She has operative debridement in 2 days' time. By review his note apparently they're going to place there is skin over the majority of this wound which is a good choice. She has a small satellite area at the most proximal part of this wound on the left plantar foot. The area on the right plantar foot we've been using silver alginate and it is close to healing. 07/02/17; unfortunately the patient was not easily approved for [name] proposed surgery. I'm not completely certain what the issue is. She has been using silver alginate to the wound she has completed a first  course of hyperbarics. She is still on Levaquin and Flagyl. I have really lost track of the time course here.I suspect she should have another week to 2 of antibiotics. I'll need to see if she is followed up with infectious disease Dr. Megan Salon 07/09/17; the patient is followed up with Dr. Megan Salon. 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 She continues on Levaquin and metronidazole continuing both of these for now I think she is been on fr about 6 weeks. She still has some drainage but no pain. No fever. Her had been plans for her to go to the OR for operative debridement with her podiatrist [name], I am not exactly sure where that is. I'll probably slip a note to [name] today. I  note that she follows with Dr. Dwyane Dee of endocrinology. We have her recertified for hyperbaric oxygen. I have not heard about Dermagraft however I'll see if [name] is planning a skin substitute as well 07/16/17; the patient tells me she is just about out of [place]. I'll need to check Dr. Hale Bogus last notes on this. She states she has plenty of Flagyl however. She comes in today complaining of pain in the right lateral foot which she said lasted for about a day. The wound on the right foot is actually much more medially. She also tells me that the [facility] cost a lot of pain in the left foot wound and she turned back to silver alginate. Finally Dermagraft has a $960 per application co-pay. She cannot afford this 07/23/17; patient arrives today with the wound not much smaller. There is not much new to add. She has not heard from [name] all try to put in a call to them today. She was asking about Dermagraft again and she has an over $454 per application co-pay she states that she would be willing to try to do a payment plan. I been tried to avoid this. We've been using silver alginate, I'll change to [facility] 07/30/17-She is here in follow-up evaluation for left  foot ulcer. She continues hyperbaric medicine. The left foot ulcer is stable we will continue with same treatment plan 08/06/17; she is here for evaluation of her left foot ulcer. Currently being treated for hyperbarics or underlying osteomyelitis. She is completed antibiotics. The left foot ulcer is better smaller with healthier looking granulation. For various reasons I am not really clear on we never got her back to the OR with [name]. He did not respond to my secure text message. Nevertheless I think that surgery on this point is not necessary nor am I completely clear that a skin substitute is necessary The patient is complaining about pain on the outside of her right foot. 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 There is no erythema. She also states the foot is warm versus her other part of her upper leg and this is largely true. It is not totally clear to me what's causing this. She thinks it's different from her usual neuropathy pain 08/13/17; she arrives in clinic today with a small wound which is superficial on her right first metatarsal head. 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 She tells [place] she was up on her feet over the Mother's Day celebration. ooThe large wound is on the left foot. Continues with hyperbarics for underlying osteomyelitis. We're using Hydrofera Blue. She asked me today about where we were with Dermagraft. I had actually excluded this because of the co-pay however she wants to assume this therefore I'll recheck the co-pay an order for next week. 08/20/17; the patient agreed to accept the co-pay of the first Apligraf which we applied today. She is disappointed she is finishing hyperbarics will run this through the insurance on the extent of the foot infection and the extent of the wound that she had however she is already had 60 dive's. Dermagraft No. 1 08/27/17; Dermagraft No. 2. She is not eligible for any more hyperbaric  treatments this month. She reports a fair amount of drainage and she actually changed to the external dressings without disturbing the direct contact layer 09/03/17; the patient arrived in clinic today with the wound superficially looking quite healthy. Nice vibrant red tissue with some advancing epithelialization although not as  much adherence of the flap as I might like. However she noted on her own fourth toe some bogginess and she brought that to our attention. Indeed this was boggy feeling like a possibility of subcutaneous fluid. 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 She is not been unwell. We've been using Dermagraft 09/10/17; the culture that I did not last week was MRSA. She saw Dr. Megan Salon this morning who is going to start her on vancomycin. I had sent him a secure a text message yesterday. I also spoke with her podiatric surgeon [name] about surgery on this foot the options for conserving a functional foot etc. Promised me he would see her and will make back consultation today. Paradoxically her actual wound on the plantar aspect of her left foot looks really quite good. I had given her 5 days worth of Baxdella to cover her for MRSA. Her MRI came back showing osteomyelitis within the third metatarsal shaft and head and base of the third and fourth proximal phalanx. She had extensive inflammatory changes throughout the soft tissue of the lateral forefoot. With an ill-defined fluid around the fourth metatarsal extending into the plantar and dorsal soft tissues 09/19/17; the patient is actually on oral Septra and Flagyl. She apparently refused IV vancomycin. She also saw [name] at my request who is planning her for a left BKA sometime in mid July. MRI showed osteomyelitis within the third metatarsal shaft and head and the basis of the third and fourth proximal phalanx. I believe there was felt to be possible  septic arthritis involving the third MTP. 09/26/17; the patient went back to Dr. Megan Salon at my suggestion and is now receiving IV daptomycin. Her wound continues to look quite good making the decision to proceed with a transmetatarsal amputation although more difficult for the patient. I believe in my extensive discussions with her she has a good sense of the pros and cons of this. I don't NV the tuft decision she has to make. She has an appointment with [name] I believe in mid July and I previously spoken to him about this issue Has we had used 3 previous Dermagraft. Given the condition of the wound surface I went ahead and added the fourth one today area and I did this not fully realizing that she'll be traveling to [place] next week. I'm hopeful she can come back in 2 weeks 10/21/17; Her same Dermagraft on for about 3-1/2 weeks. In spite of this the wound arrives looking quite healthy. There is been a lot of healing dimensions are smaller. Looking at the square shaped wound she has now there is some undermining and some depth medially under the undermining although I cannot palpate any bone. No surrounding infection is obvious. She has difficult questions about how to look at this going forward vis--vis amputations versus continued medical therapy. T be truthful the wound is looks so o healthy and it is continued to contract. Hard to justify foot surgery at this point although I still told her that I think it might come to that if we are not able to eradicate the underlying MRSA. She is still highly at risk and she understands this 11/06/17 on evaluation today patient appears to be doing better in regard to her foot ulcer. She's been tolerating the dressing changes without complication. Currently she is here for her Dermagraft #6. Her wound continues to make excellent  progress at this point. She does not appear to have any evidence of infection which is good news. 11/13/17 on evaluation today  patient appears to be doing excellent at this time. She is here for repeat Dermagraft application. This is #7. Overall her wound seems to be making great progress. 12/05/17; the patient arrives with the wound in much better condition than when I last saw this almost 6 weeks ago. She still has a small probing area in the left metatarsal head region on the lateral aspect of her foot. We applied her last Dermagraft today. ooSince the last time she is here she has what appears to a been a blood blister on the plantar aspect of left foot although I don't see this is threatening. There is also a thick raised tissue on the right mid metatarsal head region. This was not there I don't think the last time she was here 3 weeks ago. 12/12/17; the patient continues to have a small programming area in the left metatarsal head region on the lateral aspect of her foot which was the initial large surgical wound. I applied her last Apligraf last week. I'm going to use Endoform starting today ooUnfortunately she has an excoriated area in the left mid foot and the right mid foot. The left midfoot looks like a blistered area this was not opened last week it certainly is open today. Using silver alginate on these areas. She promises me she is offloading this. 12/19/17; the small probing area in the left metatarsal head eyes think is shallower. In general her original wound looks better. We've been using Endoform. The area inferiorly that I think was trauma last week still requires debridement a lot of nonviable surface which I removed. She still has an open open area distally in her foot ooSimilarly on the right foot there is tightly adherent surface debris which I removed. Still areas that don't look completely epithelialized. This is a small open area. We used silver alginate on these areas 12/26/2017; the patient did not have the supplies we ordered from last week including the Endoform. The original large wound on the  left lateral foot looks healthy. She still has the undermining area that is largely unchanged from last week. She has the same heavily callused raised edged wounds on the right mid and left midfoot. Both of these requiring debridement. We have been using silver alginate on these areas 01/02/2018; there is still supply issues. We are going to try to use Prisma but I am not sure she actually got it from what she is saying. She has a new open area on the lateral aspect of the left fourth toe [previous fifth ray amputation]. Still the one tunneling area over the fourth metatarsal head. The area is in the midfoot bilaterally still have thick callus around them. She is concerned about a raised swelling on the lateral aspect of the foot. However she is completely insensate 01/10/2018; we are using Prisma to the wounds on her bilateral feet. Surprisingly the tunneling area over the left fourth metatarsal head that was part of her original surgery has closed down. She has a small open area remaining on the incision line. 2 open areas in the midfoot. 02/10/2018; the patient arrives back in clinic after a month hiatus. She was traveling to visit family in [place]. Is fairly clear she was not offloading the areas on her feet. The original wound over the left lateral foot at the level of metatarsal heads is reopened and probes  medially by about a centimeter or 2. She notes that a week ago she had purulent drainage come out of an area on the left midfoot. Paradoxically the worst area is actually on the right foot is extensive with purulent drainage. We will use silver alginate today 02/17/2018; the patient has 3 wounds one over the left lateral foot. She still has a small area over the metatarsal heads which is the remnant of her original surgical wound. This has medial probing depth of roughly 1.4 cm somewhat better than last week. The area on the right foot is larger. We have been using silver alginate to all  areas. The area on the right foot and left foot that we cultured last week showed both Klebsiella and Proteus. Both of these are quinolone sensitive. The patient put her's self on Bactrim and Flagyl that she had left hanging around from prior antibiotic usages. She was apparently on this last week when she arrived. I did not realize this. Unfortunately the Bactrim will not cover either 1 of these organisms. We will send in Cipro 500 twice daily for a week 03/04/2018; the patient has 2 wounds on the left foot one is the original wound which was a surgical wound for a deep DFU. At one point this had exposed bone. She still has an area over the fourth metatarsal head that probes about 1.4 cm although I think this is better than last week. I been using silver nitrate to try and promote tissue adherence and been using silver alginate here. ooShe also has an area in the left midfoot. This has some depth but a small linear wound. Still requiring debridement. ooOn the right midfoot is a circular wound. A lot of thick callus around this area. ooWe have been using silver alginate to all wound areas ooShe is completed the ciprofloxacin I gave her 2 weeks ago. 03/11/2018; the patient continues to have 2 open areas on the left foot 1 of which was the original surgical wound for a deep DFU. Only a small probing area remains although this is not much different from last week we have been using silver alginate. The other area is on the midfoot this is smaller linear but still with some depth. We have been using silver alginate here as well ooOn the right foot she has a small circular wound in the mid aspect. This is not much smaller than last time. We have been using silver alginate here as well 03/18/2018; she has 3 wounds on the left foot the original surgical wound, a very superficial wound in the mid aspect and then finally the area in the mid plantar foot. She arrives in today with a very concerning area in  the wound in the mid plantar foot which is her most proximal wound. There is undermining here of roughly 1-1/2 cm superiorly. Serosanguineous drainage. She tells me she had some pain on for over the weekend that shot up her foot into her thigh and she tells me that she had a nodule in the groin area. ooShe has the single wound in the right foot. ooWe are using endoform to both wound areas 03/24/2018; the patient arrives with the original surgical wound in the area on the left midfoot about the same as last week. There is a collection of fluid under the surface of the skin extending from the surgical wound towards the midfoot although it does not reach the midfoot wound. The area on the right foot is about the same. Cultures  from last week of the left midfoot wound showed abundant Klebsiella abundant Enterococcus faecalis and moderate methicillin resistant staph I gave her Levaquin but this would have only covered the Klebsiella. She will need linezolid 04/01/2018; she is taking linezolid but for the first few days only took 1 a day. I have advised her to finish this at twice daily dosing. In any case all of her wounds are a lot better especially on the left foot. The original surgical wound is closed. The area on the left midfoot considerably smaller. The area on the right foot also smaller. 04/08/2018; her original surgical wound/osteomyelitis on the left foot remains closed. She has area on the left foot that is in the midfoot area but she had some streaking towards this. This is not connected with her original wound at least not visually. ooSmall wound on the right midfoot appears somewhat smaller. 04/15/18; both wounds looks better. Original wound is better left midfoot. Using silver alginate 1/21; patient states she uses saltwater soak in, stones or remove callus from around her wounds. She is also concerned about a blood blister she had on the left foot but it simply resolved on its own.  We've been using silver alginate 1/28; the patient arrives today with the same streaking area from her metatarsals laterally [the site of her original surgical wound] down to the middle of her foot. There is some drainage in the subcutaneous area here. This concerns me that there is actually continued ongoing infection in the metatarsals probably the fourth and third. This fixates an MRI of the foot without contrast [chronic renal failure] ooThe wound in the mid part of the foot is small but I wonder whether this area actually connects with the more distal foot. ooThe area on the right midfoot is probably about the same. Callus thick skin around the small wound which I removed with a curette we have been using silver alginate on both wound areas 2/4; culture I did of the draining site on the left foot last time grew methicillin sensitive staph aureus. MRI of the left foot showed interval resolution of the findings surrounding the third metatarsal joint on the prior study consistent with treated osteomyelitis. Chronic soft tissue ulceration in the plantar and lateral aspect of the forefoot without residual focal fluid collection. No evidence of recurrent osteomyelitis. 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 She tells me she had nausea and vomiting over the weekend and missed 2 days. In general her foot looks much the same. She has a small open area just below the left fourth metatarsal head. A linear area in the left midfoot. Some discoloration extending from the inferior part of this into the left lateral foot although this appears to be superficial. She has a small area on the right midfoot which generally looks smaller after debridement 2/18; the  patient is completing his cephalexin and has another 2 days. She continues to have open areas on the left and right foot. 2/25; she is now off antibiotics. The area on the left foot at the site of her original surgical wound has closed yet again. She still has open areas in the mid part of her foot however these appear smaller. The area on the right mid foot looks  about the same. We have been using silver alginate She tells me she had a serious hypoglycemic spell at home. She had to have EMS called and get IV dextrose 3/3; disappointing on the left lateral foot large area of necrotic tissue surrounding the linear area. This appears to track up towards the same original surgical wound. Required extensive debridement. The area on the right plantar foot is not a lot better also using silver 3/12; the culture I did last time showed abundant enterococcus. I have prescribed Augmentin, should cover any unrecognized anaerobes as well. In addition there were a few MRSA and Serratia that would not be well covered although I did not want to give her multiple antibiotics. She comes in today with a new wound in the right midfoot this is not connected with the original wound over her MTP a lot of thick callus tissue around both wounds but once again she said she is not walking on these areas 3/17-Patient comes in for follow-up on the bilateral plantar wounds, the right midfoot and the left plantar wound. Both these are heavily callused surrounding the wounds. We are continuing to use silver alginate, she is compliant with offloading and states she uses a wheelchair fairly often at home 3/24; both wound areas have thick callus. However things actually look quite a bit better here for the majority of her left foot and the right foot. 3/31; patient continues to have thick callused somewhat irritated looking tissue around the wounds which individually are fairly superficial. There is no evidence of surrounding  infection. We have been using silver alginate however I change that to [facility] today 4/17; patient returns to clinic after having a scare with Covid she tested negative in her primary doctor's office. She has been using Hydrofera Blue. She does not have an open area on the right foot. On the left foot she has a small open area with the mid area not completely viable. She showed me pictures of what looks like a hemorrhagic blister from several days ago but that seems to have healed over this was on the lateral left foot 4/21; patient comes in to clinic with both her wounds on her feet closed. However over the weekend she started having pain in her right foot and leg up into the thigh. She felt as though she was running a low-grade fever but did not take her temperature. She took a doxycycline that she had leftover and yesterday a single Septra and metronidazole. She thinks things feel somewhat better. 4/28; duplex ultrasound I ordered last week was negative for DVT or superficial thrombophlebitis. She is completed the doxycycline I gave her. States she is still having a lot of pain in the right calf and right ankle which is no better than last week. She cannot sleep. She also states she has a temperature of up to 101, coughing and complaining of visual loss in her bilateral eyes. Apparently she was tested for Covid 2 weeks ago at [facility] and that was negative. Readmission: 09/03/18 patient presents back for reevaluation after having been evaluated at the end of April regarding erythema and swelling of her right lower extremity. Subsequently she ended up going to the hospital on 07/29/18 and was admitted not to be discharged until 08/08/18. Unfortunately it was noted during the time that she was in the hospital that she did have methicillin-resistant Staphylococcus aureus as the infection noted at the site. It was also determined that she did have osteomyelitis which appears to be  fairly significant.  She was treated with vancomycin and in fact is still on IV vancomycin at dialysis currently. This is actually slated to continue until 09/12/18 at least which will be the completion of the six weeks of therapy. Nonetheless based on what I'm seeing at this point I'm not sure she will be anywhere near ready to discontinue antibiotics at that time. Since she was released from the hospital she was seen by [name] who is her podiatrist on 08/27/18. His note specifically states that he is recommended that the patient needs of one knee amputation on the right as she has a life- threatening situation that can lead quickly to sepsis. The patient advised she would like to try to save her leg to which [name] apparently told her that this was against all medical advice. She also want to discontinue the Wound VAC which had been initiated due to the fact that she wasn't pleased with how the wound was looking and subsequently she wanted to pursue applying Medihoney at that time. He stated that he did not believe that the right lower extremity was salvageable and that the patient understood but would still like to attempt hyperbaric option therapy if it could be of any benefit. She was therefore referred back to [place] for further evaluation. He plans to see her back next week. Upon inspection today patient has a significant amount purulent drainage noted from the wound at this point. The bone in the distal portion of her foot also appears to be extremely necrotic and spongy. When I push down on the bone it bubbles and seeps purulent drainage from deeper in the end of the foot. I do not think that this is likely going to heal very well at all and less aggressive surgical debridement were undertaken more than what I believe we can likely do here in our office. 09/12/2018; I have not seen this patient since the most recent hospitalization although she was in our clinic last week. I have reviewed some of her records from a  complex hospitalization. 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 There is situation was complicated by MRSA bacteremia and acute on chronic renal failure now on dialysis. She is receiving vancomycin at dialysis. We started her on Dakin's wet-to-dry last week she is changing this daily. There is still purulent drainage coming out of her foot. Although she is apparently "agreeable" to a below-knee amputation which is been suggested by multiple clinicians she wants this to be done in [place]. She apparently has a telehealth visit with that provider sometime in late [place] 6/24. I have told her I think this is probably too long. Nevertheless I could not convince her to allow a local doctor to perform BKA. 09/19/2018; the patient has a large necrotic area on the right anterior foot. She has had previous transmetatarsal amputations. Culture I did last week showed MRSA nothing else she is on vancomycin at dialysis. She has continued leaking purulent drainage out of the distal part of the large circular wound on the right anterior foot. She apparently went to see Dr. 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 Somehow that translated into her being referred to plastic surgery for debridement of the area. I gather she basically refused amputation although I do not have a copy of Dr. Berenice Primas notes. The patient really wants to have a trial of hyperbaric oxygen. I agreed with initial assessment in this clinic that  this was probably too far along to benefit however if she is going to have plastic surgery I think she would benefit from ancillary hyperbaric oxygen. The issue here is that the patient has benefited as maximally as any patient I have ever seen from hyperbaric oxygen therapy. Most recently she had exposed bone on the lateral part of her left foot after a surgical procedure and that actually has closed. She has eschared  areas in both heels but no open area. She is remained systemically well. I am not optimistic that anything can be done about this but the patient is very clear that she wants an attempt. The attempt would include a wound VAC further debridements and hyperbaric oxygen along with IV antibiotics. 6/26; I put her in for a trial of hyperbaric oxygen only because of the dramatic response she has had with wounds on her left midfoot earlier this year which was a surgical wound that went straight to her bone over the metatarsal heads and also remotely the left third toe. We will see if we can get this through our review process and insurance. She arrives in clinic with again purulent material pouring out of necrotic bone on the top of the foot distally. There is also some concerning erythema on the front of the leg that we marked. It is bit difficult to tell how tender this is because of neuropathy. I note from infectious disease that she had her vancomycin extended. 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 She had the wound VAC on for part of the week. The rest of the time she is putting various things on this including Medihoney, "ionized water" silver sorb gel etc. 7/7; follow-up along with HBO. She is still on vancomycin at dialysis. She has a large open area on the dorsal right foot and a small dark eschar area on her heel. There is a lot less erythema in the area and a lot less tenderness. From an infection point of view I think this is better. She still has a lot of necrosis in the remaining right forefoot [previous TMA] we are still using the wound VAC in this area 7/16; follow-up along with HBO. I put her on linezolid after she finished her vancomycin. We started this last Friday I gave her 2 weeks worth. I had the expectation that she would be operatively debrided by Dr. Marla Roe but that still has not happened yet. Patient phoned the office this week. She arrives  for review today after HBO. The distal part of this wound is completely necrotic. Nonviable pieces of tendon bone was still purulent drainage. Also concerning that she has black eschar over the heel that is expanding. I think this may be indicative of infection in this area as well. She has less erythema and warmth in the ankle and calf but still an abnormal exam 7/21 follow-up along with HBO. I will renew her linezolid after checking a CBC with differential monitoring her blood counts especially her platelets. She was supposed to have surgery yesterday but if I am reading things correctly this was canceled after her blood sugar was found to be over 500. I thought Dr. Marla Roe who called me said that they were sending her to the ER but the patient states that was not the case. 7/28. Follow-up along with HBO. She is on linezolid I still do not have any lab work from dialysis even though I called last week. The patient is concerned about an  area on her left lateral foot about the level of the base of her fifth metatarsal. I did not really see anything that ominous here however this patient is in [place] ability to point out problems that she is sensing and she has been accurate in the past Finally she received a call from Dr. Marla Roe who is referring her to another orthopedic surgeon stating that she is too booked up to take her to the operating room now. Was still using a wound VAC on the foot 8/3 -Follow-up after HBO, she is got another week of linezolid, she is to call ID for an appointment, x-rays of both feet were reviewed, the left foot x-ray with third MTP joint osteo- Right foot x-ray widespread osteo-in the right midfoot Right ankle x-ray does not show any active evidence of infection 8/11-Patient is seen after HBO, the wounds on the right foot appear to be about the same, the heel wound had some necrotic base over tendon that was debrided with a curette 8/21; patient is seen after HBO.  The patient's wound on her dorsal foot actually looks reasonably good and there is substantial amount of epithelialization however the open area distally still has a lot of necrotic debris partially bone. I cannot really get a good sense of just how deep this probes under the foot. She has been pressuring me this week to order medical maggots through a company in [place] for her. The problem I have is there is not a defined wound area here. On the positive side there is no purulence. She has been to see infectious disease she is still on Septra DS although I have not had a chance to review their notes 8/28; patient is seen in conjunction with HBO. The wounds on her foot continued to improve including the right dorsal foot substantially the, the distal part of this wound and the area on the right heel. We have been using a wound VAC over this chronically. She is still on trimethoprim as directed by infectious disease 9/4; patient is seen in conjunction with HBO. Right dorsal foot wound substantially anteriorly is better however she continues to have a deep wound in the distal part of this that is not responding. We have been using silver collagen under border foam ooArea on the right plantar medial heel seems better. We have been using Hydrofera Blue 12/12/18 on evaluation today patient appears to be doing about the same with regard to her wound based on prior measurements. She does have some necrotic tissue noted on the lateral aspect of the wound that is going require a little bit of sharp debridement today. This includes what appears to be potentially either severely necrotic bone or tendon. Nonetheless other than that she does not appear to have any severe infection which is good news 9/18; it is been 2 weeks since I saw this wound. She is tolerating HBO well. Continued dramatic improvement in the area on the right dorsal foot. She still has a small wound on the heel that we have been using  Hydrofera Blue. She continues with a wound VAC 9/24; patient has to be seen emergently today with a swelling on her right lateral lower leg. She says that she told Dr. Evette Doffing about this and also myself on a couple of occasions but I really have no recollection of this. She is not systemically unwell and her wound really looked good the last time I saw this. She showed this to providers at dialysis and she was  able to verify that she was started on cephalexin today for 5 doses at dialysis. She dialyzes on Tuesday Thursday and Saturday. 10/2; patient is seen in conjunction with HBO. The area that is draining on the right anterior medial tibia is more extensive. Copious amounts of serosanguineous drainage with some purulence. We are still using the wound VAC on the original wound then it is stable. Culture I did of the original IandD showed MRSA I contacted dialysis she is now on vancomycin with dialysis treatments. I asked them to run a month 10/9; patient seen in conjunction with HBO. She had a new spontaneous open area just above the wound on the right medial tibia ankle. More swelling on the right medial tibia. Her wound on the foot looks about the same perhaps slightly better. There is no warmth spreading up her leg but no obvious erythema. her MRI of the foot and ankle and distal tib-fib is not booked for next Friday I discussed this with her in great detail over multiple days. it is likely she has spreading infection upper leg at least involving the distal 25% above the ankle. She knows that if I refer her to orthopedics for infectious disease they are going to recommend amputation and indeed I am not against this myself. We had a good trial at trying to heal the foot which is what she wanted along with antibiotics debridement and HBO however she clearly has spreading infection [probably staph aureus/MRSA]. Nevertheless she once again tells me she wants to wait the left of the MRI. She still  makes comments about having her amputation done in [place]. 10/19; arrives today with significant swelling on the lateral right leg. Last culture I did showed Klebsiella. Multidrug-resistant. Cipro was intermediate sensitivity and that is what I have her on pending her MRI which apparently is going to be done on Thursday this week although this seems to be moving back and forth. She is not systemically unwell. We are using silver alginate on her major wound area on the right medial foot and the draining areas on the right lateral lower leg 10/26; MRI showed extensive abscess in the anterior compartment of the right leg also widespread osteomyelitis involving osseous structures of the midfoot and portions of the hindfoot. Also suspicion for osteomyelitis anterior aspect of the distal medial malleolus. Culture I did of the purulence once again showed a multidrug-resistant Klebsiella. I have been in contact with nephrology late last week and she has been started on cefepime at dialysis to replace the vancomycin We sent a copy of her MRI report to Dr. Geroge Baseman in [place] who is an orthopedic surgeon. The patient takes great stock in his opinion on this. She says she will go to [place] to have her leg amputated if Dr. Geroge Baseman does not feel there is any salvage options. 11/2; she still is not talk to her orthopedic surgeon in [place]. Apparently he will call her at [phone] this afternoon. The quality of this is she has not allowed me to refer her anywhere. She has been told over and over that she needs this amputated but has not agreed to be referred. She tells me her blood sugar was 600 last night but she has not been febrile. 11/9; she never did got a call from the orthopedic surgeon in [place] therefore that is off the radar. We have arranged to get her see orthopedic surgery at [facility]. She still has a lot of draining purulence coming out of the new abscess in  her right leg although that  probably came from the osteomyelitis in her right foot and heel. Meanwhile the original wound on the right foot looks very healthy. Continued improvement. The issue is that the last MRI showed osteomyelitis in her right foot extensively she now has an abscess in the right anterior lower leg. There is nobody in [place] who will offer this woman anything but an amputation and to be honest that is probably what she needs. I think she still wants to talk about limb salvage although at this point I just do not see that. She has completed her vancomycin at dialysis which was for the original staph aureus she is still on cefepime for the more recent Klebsiella. She has had a long course of both of these antibiotics which should have benefited the osteomyelitis on the right foot as well as the abscess. 11/16; apparently [place] elective surgery is shut down because of COVID-19 pandemic. I have reached out to some contacts at [facility] to see if we can get her an orthopedic appointment there. I am concerned about continually leaving this but for the moment everything is static. In fact her original large wound on this foot is closing down. It is the abscess on the right anterior leg that continues to drain purulent serosanguineous material. She is not currently on any antibiotics however she had a prolonged course of vancomycin [1 month] as well as cefepime for a month 02/24/2019 on evaluation today patient appears to be doing better than the last time I saw her. This is not a patient that I typically see. With that being said I am covering for Dr. Dellia Nims this week and again compared to when I last saw her overall the wounds in particular seem to be doing significantly better which is good news. With that being said the patient tells me several disconcerting things. She has not been able to get in to see anyone for potential debridement in regard to her leg wounds although she tells me that she does not  think it is necessary any longer because she is taking care of that herself. She noticed a string coming out of the lower wound on her leg over the last week. The patient states that she subsequently decided that we must of pack something in there and started pulling the string out and as it kept coming and coming she realized this was likely her tendon. With that being said she continued to remove as much of this as she could. She then I subsequently proceeded to using tubes of antibiotic ointment which she will stick down into the wound and then scored as much as she can until she sees it coming out of the other wound opening. She states that in doing this she is actually made things better and there is less redness and irritation. With regard to her foot wound she does have some necrotic tendon and tissue noted in one small corner but again the actual wound itself seems to be doing better with good granulation in general compared to my last evaluation. 12/7; continued improvement in the wound on the substantial part of the right medial foot. Still a necrotic area inferiorly that required debridement but the rest of this looks very healthy and is contracting. She has 2 wounds on the right lateral leg which were her original drainage sites from her abscess but all of this looks a lot better as well. She has been using silver alginate after putting antibiotic biotic ointment  in one wound and watching it come out the other. I have talked to her in some detail today. I had given her names of orthopedic surgeons at [facility] for second opinion on what to do about the right leg. I do not think the patient never called them. She has not been able to get a hold of the orthopedic surgeon in [place] that she had put a lot of faith in as being somebody would give her an opinion that she would trust. I talked to her today and said even if I could get her in to another orthopedic surgeon about the leg which she  accept an amputation and she said she would not therefore I am not going to press this issue for the moment 12/14; continued improvement in his substantial wound on the right medial foot. There is still a necrotic area inferiorly with tightly adherent necrotic debris which I have been working on debriding each time she is here. She does not have an orthopedic appointment. Since last time she was here I looked over her cultures which were essentially MRSA on the foot wound and gram-negative rods in the abscess on the anterior leg. 12/21; continued improvement in the area on the right medial foot. She is not up on this much and that is probably a good thing since I do not know it could support continuous ambulation. She has a small area on the right lateral leg which were remanence of the IandD's I did because of the abscess. I think she should probably have prophylactic antibiotics I am going to have to look this over to see if we can make an intelligent decision here. In the meantime her major wound is come down nicely. Necrotic area inferiorly is still there but looks a lot better 04/06/2019; she has had some improvement in the overall surface area on the right medial foot somewhat narrowedr both but somewhat longer. The areas on the right lateral leg which were initial IandD sites are superficial. Nothing is present on the right heel. We are using silver alginate to the wound areas 1/18; right medial foot somewhat smaller. Still a deep probing area in the most distal recess of the wound. She has nothing open on the right leg. She has a new wound on the plantar aspect of her left fourth toe which may have come from just pulling skin. The patient using Medihoney on the wound on her foot under silver alginate. I cannot discourage her from this 2/1; 2-week follow-up using silver alginate on the right foot and her left fourth toe. The area on the right dorsal foot is contracted although there is still the  deep area in the most distal part of the wound but still has some probing depth. No overt infection 2/15; 2-week follow-up. She continues to have improvement in the surface area on the dorsal right foot. Even the tunneling area from last time is almost closed. The area that was on the plantar part of her left fourth toe over the PIP is indeed closed 3/1; 2-week follow-up. Continued improvement in surface area. The original divot that we have been debriding inferiorly I think has full epithelialization although the epithelialization is gone down into the wound with probably 4 mm of depth. Even under intense illumination I am unable to see anything open here. The remanence of the wound in this area actually look quite healthy. We have been using silver alginate 3/15; 2-week follow-up. Unfortunately not as good today. She has a  comma shaped wound on the dorsal foot however the upper part of this is larger. Under illumination debris on the surface She also tells [place] that she was on her right leg 2 times in the last couple of weeks mostly to reach up for things above her head etc. She felt a sharp pain in the right leg which she thinks is somewhere from the ankle to the knee. The patient has neuropathy and is really uncertain. She cannot feel her foot so she does not think it was coming from there 3/29; 2-week follow-up. Her wound measures smaller. Surface of the wound appears reasonable. She is using silver alginate with underlying Medihoney. She has home health. X-rays I did of her tib-fib last time were negative although it did show arterial calcification 4/12; 2-week follow-up. Her wound measures smaller in length. Using manuka honey with silver alginate on top. She has home health. 4/26; 2-week follow-up. Her wound is smaller but still very adherent debris under illumination requiring debridement she has been using manuka honey with silver alginate. She has home health 08/28/19-Wound has about the  same size, but with a layer of eschar at the lateral edge of the amputation site on the right foot. Been using Hydrofera Blue. She is on suppressive Bactrim but apparently she has been taking it twice daily 6/7; I have not seen this wound and about 6 weeks. Since then she was up in [place]. By her own admission she was walking on the foot because she did not have a wheelchair. The wound is not nearly as healthy looking as it was the last time I saw this. We ordered different things for her but she only uses Medihoney and silver alginate. As far as I know she is on suppressive trimethoprim sulfamethoxazole. She does not admit to any fever or chills. Her CBGs apparently are at baseline however she is saying that she feels some discomfort on the lateral part of her ankle I looked over her last inflammatory markers from the summer 2020 at which time she had a deeply necrotic infected wound in this area. On 11/10/2018 her sedimentation rate was 56 and C-reactive protein 9.9. This was 107 and 29 on 07/29/2018. 6/17; the patient had a necrotic wound the last time she was here on the right dorsal foot. After debridement I did a culture. This showed a very resistant ESBL Klebsiella as well as Enterococcus. Her x-ray of the foot which was done because of warmth and some discomfort showed bone destruction within the carpal bones involving the navicular acute cuboid lateral middle cuneiforms but essentially unchanged from her prior study which was done on 10/29/2018. The findings were felt to represent chronic osteomyelitis. We did inflammatory markers on her. Her white count was 5.25 sedimentation rate 16 and C-reactive protein at 11.1. Notable for the fact that in August 2020 her CRP was 9.9 and sedimentation rate 56. I have looked at her x-rays. It is true that the bone destruction is very impressive however the patient came into this clinic for the wound on her right foot with pieces of bone literally falling  out anteriorly with purulent material. I am not exactly sure I could have expected anything different. She has not been systemically unwell no fever chills or blood sugars have been reasonable. 6/28; she arrives with a right heel closed. The substantial area on the right anterior foot looks healthy. Much better looking surface. I think we can change to [facility] seems to help this previously. She  is getting her antibiotics at dialysis she should be just about finished 7/9; changed to [facility] last week. Surface wound looks satisfactory not much change in surface area however. She is going to [place] state next week this is usually a difficult thing for this patient follow-up will be for 2 weeks. 7/23; using Hydrofera Blue. She returns from her trip and the wound looks surprisingly good. Usually when this patient goes on trips she comes back with a lot of problems with the wound. She is saying that she sometimes feels an episodic "crunching" feeling on the lateral part of the foot. She is neuropathic and not feeling pain but wonders whether this could be a neuropathic dysesthesia. 11/13/19-Patient returns after 3 weeks, the wound itself is stable and patient states that there is nothing new going on she is on some extra anxiety medications and is resisting the temptation to pick at the dry skin around the wound. 9/20; patient has not been here in over a month and I have not seen her in 2 months. The wound in terms of size I think is about the same. There is no exposed bone. She has a nonviable surface on this. She is supposed to be using [facility] however she is also been using some form of honey preparation as well as a silver-based dressings. I do not think she has any pattern to this. 10/4; 2-week follow-up. Patient has been using some form of spray which she says has honey and silver to purchase this online she has been covering it with gauze. In spite of this the wound actually  looks quite good. The deeper divot distally appears to be close down. There is a rim of epithelialization. 10/18; 2-week follow-up. Patient has been using her Hydrofera Blue covered with her silver honey spray that she got online. 11/1; 2-week follow-up. She is using Hydrofera Blue with a silver honey spray. Wound bed is measuring smaller. She has noticed that her foot is warmer on the right. She is concerned about infection. For a long period of time I had her on prophylactic trimethoprim sulfamethoxazole DS 1 tablet daily. She is asking for this to be restarted. The patient is walking on this foot because of repairs that are being done in a home her but her room is on the second floor she has to go up and down stairs. I have cautioned against this however as usual she will do exactly what she wants to do 11/15; 2-week follow-up. She uses Hydrofera Blue with a silver/honey spray which I have never heard of. I think her wound looks about the same. Some epithelialization. No evidence that this is infected. I think she is walking on this more than we agreed on. She is going on extensive vacation over Thanksgiving 12/3; 2-week follow-up. She is using Hydrofera Blue however over Thanksgiving she ran out of this and she is simply been using Medihoney. In spite of this her wound is smaller almost divided into 2 now. She traveled extensively over Thanksgiving and actually looks quite good in spite of this. Usually this is been a marker of problems for her 12/17; 2-week follow-up. She is using Hydrofera Blue. The wound is smaller. Debris on the surface of this is fibrinous. She is traveling to [place] over the holidays which never bodes well for her wounds. I think she is walking more on her feet then she is even willing to admit and she tells me she does walk 04/22/2020; patient has not been  here in more than a month. Nevertheless her wounds do not look that bad. He has been using Medihoney. She has a  single wound on the right dorsal foot this is superficial does not appear to be infected Patient History Information obtained from Patient. Family History Cancer - Father, Diabetes - Maternal Grandparents, Stroke - Paternal Grandparents, No family history of Heart Disease, Hereditary Spherocytosis, Hypertension, Kidney Disease, Lung Disease, Seizures, Thyroid Problems, Tuberculosis. Social History Former smoker, Marital Status - Single, Alcohol Use - Moderate, Drug Use - Current History - TCH, Caffeine Use - Moderate - tea. Medical History Eyes Patient has history of Cataracts - mild Denies history of Glaucoma, Optic Neuritis Ear/Nose/Mouth/Throat Patient has history of Chronic sinus problems/congestion - "my nose runs all the time" Denies history of Middle ear problems Hematologic/Lymphatic Patient has history of Anemia Denies history of Hemophilia, Human Immunodeficiency Virus, Lymphedema, Sickle Cell Disease Respiratory Patient has history of Sleep Apnea - doesn't tolorate cpap Denies history of Aspiration, Asthma, Chronic Obstructive Pulmonary Disease (COPD), Pneumothorax, Tuberculosis Cardiovascular Patient has history of Deep Vein Thrombosis - bil arms and leg leg, Hypertension, Peripheral Arterial Disease Denies history of Angina, Arrhythmia, Congestive Heart Failure, Coronary Artery Disease, Hypotension, Myocardial Infarction, Peripheral Venous Disease, Phlebitis, Vasculitis Gastrointestinal Denies history of Cirrhosis , Colitis, Crohnoos, Hepatitis A, Hepatitis B, Hepatitis C Endocrine Patient has history of Type I Diabetes - 1975 Denies history of Type II Diabetes Genitourinary Denies history of End Stage Renal Disease Immunological Denies history of Lupus Erythematosus, Raynaudoos, Scleroderma Integumentary (Skin) Denies history of History of Burn Musculoskeletal Patient has history of Osteoarthritis - in feet, Osteomyelitis - left foot and right 1st met  head Denies history of Gout, Rheumatoid Arthritis Neurologic Patient has history of Neuropathy - feet, Seizure Disorder - diabetic related Denies history of Dementia, Quadriplegia, Paraplegia Oncologic Denies history of Received Chemotherapy, Received Radiation Psychiatric Denies history of Anorexia/bulimia, Confinement Anxiety Hospitalization/Surgery History - limb preservation. 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 - c-section. - exostectomy left cuboid and 5th metatarsal. - laser eye surgery. - left fem-pop bypass graft. - hemrroidectomy. - left eye cataract removed. - kidney stone. Medical A Surgical History Notes nd Cardiovascular hyperlipidemia Genitourinary CKD Objective Constitutional Patient is hypertensive.. Pulse regular and within target range for patient.Marland Kitchen Respirations regular, non-labored and within target range.. Temperature is normal and within the target range for the patient.Marland Kitchen Appears in no distress. Vitals Time Taken: 12:54 PM, Height: 67 in, Weight: 125 lbs, BMI: 19.6, Temperature: 98.3 F, Pulse: 90 bpm, Respiratory Rate: 17 breaths/min, Blood Pressure: 155/88 mmHg. General Notes: Wound exam; the wound actually looks better than when I thought it was going to look. No mechanical debridement was required there is no evidence of surrounding infection. Under illumination and some debris on the surface Integumentary (Hair, Skin) Wound #43 status is Open. Original cause of wound was Gradually Appeared. The date acquired was: 08/04/2018. The wound has been in treatment 85 weeks. The wound is located on the Right,Medial Foot. The wound measures 3.4cm length x 1.9cm width x 0.2cm depth; 5.074cm^2 area and 1.015cm^3 volume. There is Fat Layer (Subcutaneous Tissue) exposed. There is no tunneling or undermining noted. There is a medium amount of serosanguineous drainage noted. The wound margin is flat and intact. There is medium (34-66%) pink granulation  within the wound bed. There is a medium (34-66%) amount of necrotic tissue within the wound bed including Adherent Slough. 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 Bathing/ Shower/ Hygiene: May shower and wash wound with soap and water. Edema Control - Lymphedema / SCD / Other: Elevate legs to the level of the heart or above for 30 minutes daily and/or when sitting, a frequency of: - throughout the day Avoid standing for long periods of time. Moisturize legs daily. - with dressing changes Off-Loading: Other: - minimal weight bearing right foot Home Health: New wound care orders this week; continue Home Health for wound care. May utilize formulary equivalent dressing for wound treatment orders unless otherwise specified. Other Home Health Orders/Instructions: - Interim WOUND #43: - Foot Wound Laterality: Right, Medial Cleanser: Wound Cleanser (Home Health) Every Other Day/30 Days Discharge Instructions: Cleanse the wound with wound cleanser or normal saline prior to applying a clean dressing using gauze sponges, not tissue or cotton balls. Prim Dressing: MediHoney Calcium Alginate Dressing 4x5 in Every Other Day/30 Days ary Discharge Instructions: Apply to wound bed as instructed Secondary Dressing: Woven Gauze Sponge, Non-Sterile 4x4 in [facility]) Every Other Day/30 Days Discharge Instructions: Apply over primary dressing as directed. Secured With: Elastic Bandage 4 inch (ACE bandage) (Home Health) Every Other Day/30 Days Discharge Instructions: Secure with ACE bandage as directed. Secured With: [employer], 4.5x3.1 (in/yd) [facility]) Every Other Day/30 Days Discharge  Instructions: Secure with Kerlix as directed. Secured With: Paper T ape, 2x10 (in/yd) (Home Health) Every Other Day/30 Days Discharge Instructions: Secure dressing with tape as directed. #1 continue with Medihoney alginate. In spite of the fact we have not seen her in a while the wound actually looks quite good. No evidence of surrounding and for Electronic Signature(s) Signed: 10/24/2020 8:15:30 AM By: Linton Ham MD Entered By: Linton Ham on 08/01/2020 17:19:35 -------------------------------------------------------------------------------- HxROS Details Patient Name: Date of Service: [name] 04/22/2020 12:30 PM Medical Record Number: [id number] Patient Account Number: [id number] Date of Birth/Sex: Treating RN: [birth year] (50 y.o. [name], [name] Primary Care Provider: [name], [name] Other Clinician: Referring Provider: Treating Provider/Extender: [name], [name] Weeks in Treatment: 22 Information Obtained From Patient Eyes Medical History: Positive for: Cataracts - mild Negative for: Glaucoma; Optic Neuritis Ear/Nose/Mouth/Throat Medical History: Positive for: Chronic sinus problems/congestion - "my nose runs all the time" Negative for: Middle ear problems Hematologic/Lymphatic Medical History: Positive for: Anemia Negative for: Hemophilia; Human Immunodeficiency Virus; Lymphedema; Sickle Cell Disease Respiratory Medical History: Positive for: Sleep Apnea - doesn't tolorate cpap Negative for: Aspiration; Asthma; Chronic Obstructive Pulmonary Disease (COPD); Pneumothorax; Tuberculosis Cardiovascular Medical History: Positive for: Deep Vein Thrombosis - bil arms and leg leg; Hypertension; Peripheral Arterial Disease Negative for: Angina; Arrhythmia; Congestive Heart Failure; Coronary Artery Disease; Hypotension; Myocardial Infarction; Peripheral Venous Disease; Phlebitis; Vasculitis Past Medical History  Notes: hyperlipidemia Gastrointestinal Medical History: Negative for: Cirrhosis ; Colitis; Crohns; Hepatitis A; Hepatitis B; Hepatitis C Endocrine Medical History: Positive for: Type I Diabetes - 1975 Negative for: Type II Diabetes Time with diabetes: 44 years Treated with: Insulin Blood sugar tested every day: Yes Tested : 4-7 times per day Genitourinary Medical History: Negative for: End Stage Renal Disease Past Medical History Notes: CKD Immunological Medical History: Negative for: Lupus Erythematosus; Raynauds; Scleroderma Integumentary (Skin) Medical History: Negative for: History of Burn Musculoskeletal Medical History: Positive for: Osteoarthritis -  in feet; Osteomyelitis - left foot and right 1st met head Negative for: Gout; Rheumatoid Arthritis Neurologic Medical History: Positive for: Neuropathy - feet; Seizure Disorder - diabetic related Negative for: Dementia; Quadriplegia; Paraplegia Oncologic Medical History: Negative for: Received Chemotherapy; Received Radiation Psychiatric Medical History: Negative for: Anorexia/bulimia; Confinement Anxiety HBO Extended History Items Ear/Nose/Mouth/Throat: Eyes: Chronic sinus Cataracts problems/congestion Immunizations Pneumococcal Vaccine: Received Pneumococcal Vaccination: Yes Implantable Devices Yes Hospitalization / Surgery History Type of Hospitalization/Surgery 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 c-section exostectomy left cuboid and 5th metatarsal laser eye surgery left fem-pop bypass graft hemrroidectomy left eye cataract removed kidney stone Family and Social History Cancer: Yes - Father; Diabetes: Yes - Maternal Grandparents; Heart Disease: No; Hereditary Spherocytosis: No; Hypertension: No; Kidney Disease: No; Lung Disease: No; Seizures: No; Stroke: Yes - Paternal Grandparents; Thyroid Problems: No; Tuberculosis: No; Former smoker; Marital Status -  Single; Alcohol Use: Moderate; Drug Use: Current History - TCH; Caffeine Use: Moderate - tea; Financial Concerns: No; Food, Clothing or Shelter Needs: No; Support System Lacking: No; Transportation Concerns: No Engineer, maintenance) Signed: 04/22/2020 4:10:40 PM By: Linton Ham MD Signed: 04/27/2020 5:56:51 PM By: Rhae Hammock RN Entered By: Rhae Hammock on 04/22/2020 13:05:07 -------------------------------------------------------------------------------- SuperBill Details Patient Name: Date of Service: [name] 04/22/2020 Medical Record Number: [id number] Patient Account Number: [id number] Date of Birth/Sex: Treating RN: 1970/06/27 (50 y.o. [name] Primary Care Provider: [name], [name] Other Clinician: Referring Provider: Treating Provider/Extender: [name], [name] Weeks in Treatment: 26 Diagnosis Coding ICD-10 Codes Code Description [phone] Other chronic osteomyelitis, right ankle and foot L97.514 Non-pressure chronic ulcer of other part of right foot with necrosis of bone L97.411 Non-pressure chronic ulcer of right heel and midfoot limited to breakdown of skin E10.621 Type 1 diabetes mellitus with foot ulcer Facility Procedures Physician Procedures : CPT4 Code Description Modifier [id number] - WC PHYS LEVEL 3 - EST PT ICD-10 Diagnosis Description L97.514 Non-pressure chronic ulcer of other part of right foot with necrosis of bone Quantity: 1 Electronic Signature(s) Signed: 10/24/2020 8:15:30 AM By: Linton Ham MD Previous Signature: 04/22/2020 4:10:40 PM Version By: Linton Ham MD Previous Signature: 04/22/2020 4:31:51 PM Version By: Baruch Gouty RN, BSN Entered By: Linton Ham on 08/01/2020 17:20:01

## 2020-04-27 NOTE — Progress Notes (Signed)
Cardiology Office Note   Date:  04/28/2020   ID:  [name], DOB Apr 11, 1970, MRN [id number]  PCP:  Loyola Mast, PA-C  Cardiologist:   No primary care provider on file. Referring:  Loyola Mast, PA-C  Chief Complaint  Patient presents with  . Chest Pain      History of Present Illness: [name] is a 50 y.o. female who is referred by Loyola Mast, PA-C for evaluation of chest pain.  She has no cardiac history though she has peripheral vascular disease related to type 1 diabetes and has been very poorly controlled.  She now has end-stage renal disease and is on dialysis.  She was referred because of chest discomfort.  This was a left upper chest discomfort.  This happened a few weeks ago.  It was a gripping discomfort that was going on for about 1/2-hour kind of waxing and waning.  She took aspirin and it eventually went away.  She did not describe associated nausea vomiting or diaphoresis.  She did not have associated shortness of breath, PND or orthopnea.  However, on that same evening she has this discomfort when she would bend over.  She felt like there was a big vein in her chest that was engorging.  This was sharp and fleeting.  She had multiple episodes of this.  She has never had this kind of pain before.  She has never had any cardiac work-up.  I did review some report from [place] and do not see any past cardiac work-up.  Past Medical History:  Diagnosis Date  . Anemia   . Arthritis   . Bronchitis   . C. difficile diarrhea 09/26/2014  . Cataracts, both eyes    had surgery to remove  . Cellulitis of right foot 06/02/2014  . CKD (chronic kidney disease) stage 3, GFR 30-59 ml/min (HCC)    Dialysis T, TH, Sat  . Diabetic ulcer of right foot ([place]) 06/02/2014  . DVT (deep venous thrombosis) ([place]) 10/2014   one in each one in leg- PICC line , one in right and left arm  . H/O seasonal allergies   . History of blood transfusion   . Hypercholesteremia    . Hypertension   . Left foot infection   . Neuromuscular disorder ([place])    lower legs and feet  . Neuropathy in diabetes ([place])   . Peripheral vascular disease ([place])   . Sleep apnea    does not use cpap  . Staphylococcus aureus bacteremia 10/2014  . Type 1 diabetes ([place])    onset age 71    Past Surgical History:  Procedure Laterality Date  . AMPUTATION TOE Left 07/24/2015   5th toe and Hallux amputation  . AV FISTULA PLACEMENT Left 08/06/2018   Procedure: INSERTION OF BASCILIC VEN TRANSPOSITION 1ST STAGE;  Surgeon: Rosetta Posner, MD;  Location: [place];  Service: Vascular;  Laterality: Left;  . BASCILIC VEIN TRANSPOSITION Left 04/29/2019   Procedure: SECOND STAGE BASCILIC VEIN TRANSPOSITION LEFT ARM;  Surgeon: Rosetta Posner, MD;  Location: [facility];  Service: Vascular;  Laterality: Left;  . CESAREAN SECTION     x 1  . EXOSTECTECTOMY TOE Left 04/11/2016   Procedure: EXOSTECTECTOMY CUBOID AND 5TH METATARSAL AND PLACEMENT OF ANTIBIOTIC BEADS;  Surgeon: Edrick Kins, DPM;  Location: [place];  Service: Podiatry;  Laterality: Left;  . EYE SURGERY Bilateral    removed cataracts - laser  . FEMORAL-POPLITEAL BYPASS GRAFT Left  05/03/2015  . FOOT AMPUTATION THROUGH METATARSAL Right 2016  . hemrrhoidectomy    . I & D EXTREMITY Right 06/10/2014   Procedure: IRRIGATION AND DEBRIDEMENT Right Foot;  Surgeon: Newt Minion, MD;  Location: [place];  Service: Orthopedics;  Laterality: Right;  . I & D EXTREMITY Right 07/31/2018   Procedure: IRRIGATION AND DEBRIDEMENT EXTREMITY;  Surgeon: Edrick Kins, DPM;  Location: [place];  Service: Podiatry;  Laterality: Right;  . I & D EXTREMITY Right 08/04/2018   Procedure: IRRIGATION AND DEBRIDEMENT FOOT with placement of wound vac;  Surgeon: Edrick Kins, DPM;  Location: [place];  Service: Podiatry;  Laterality: Right;  . INCISION AND DRAINAGE Left 04/11/2016   Procedure: INCISION AND DRAINAGE A DEEP COMPLICATED WOUND OF LEFT FOOT;  Surgeon: Edrick Kins, DPM;  Location: [place];  Service: Podiatry;  Laterality: Left;  . INSERTION OF DIALYSIS CATHETER Right 08/06/2018   Procedure: INSERTION OF TUNNELED DIALYSIS CATHETER;  Surgeon: Rosetta Posner, MD;  Location: [place];  Service: Vascular;  Laterality: Right;  . IR FLUORO GUIDE CV LINE RIGHT  09/24/2017  . IR FLUORO GUIDE CV LINE RIGHT  08/01/2018  . IR REMOVAL TUN CV CATH W/O FL  11/22/2017  . IR US GUIDE VASC ACCESS RIGHT  09/24/2017  . IR US GUIDE VASC ACCESS RIGHT  08/01/2018  . IRRIGATION AND DEBRIDEMENT ABSCESS Left 05/06/2017   Procedure: IRRIGATION AND DEBRIDEMENT ABSCESS LEFT FOOT;  Surgeon: Edrick Kins, DPM;  Location: [facility];  Service: Podiatry;  Laterality: Left;  . PERIPHERAL VASCULAR BALLOON ANGIOPLASTY Left 01/20/2020   Procedure: PERIPHERAL VASCULAR BALLOON ANGIOPLASTY;  Surgeon: Marty Heck, MD;  Location: [facility];  Service: Cardiovascular;  Laterality: Left;  Arm fistula  . PERIPHERALLY INSERTED CENTRAL CATHETER INSERTION    . SKIN GRAFT Right 06/15/2014  . SKIN GRAFT Left 06/2015   foot   . SKIN SPLIT GRAFT Right 06/15/2014   Procedure: SPLIT THICKNESS SKIN GRAFT RIGHT FOOT;  Surgeon: Newt Minion, MD;  Location: [facility];  Service: Orthopedics;  Laterality: Right;  . TRANSMETATARSAL AMPUTATION Right   . TRANSMETATARSAL AMPUTATION Right 09/11/2014  . WISDOM TOOTH EXTRACTION       Current Outpatient Medications  Medication Sig Dispense Refill  . albuterol (PROVENTIL HFA;VENTOLIN HFA) 108 (90 BASE) MCG/ACT inhaler Inhale 2 puffs into the lungs every 6 (six) hours as needed for wheezing or shortness of breath.     Marland Kitchen aspirin EC 81 MG tablet Take 81 mg by mouth daily.    . B Complex-C-Zn-Folic Acid (DIALYVITE 937 WITH ZINC) 0.8 MG TABS Take 1 tablet by mouth daily.    . calcitRIOL (ROCALTROL) 0.5 MCG capsule Take 1 capsule daily 90 capsule 2  . calcium acetate (PHOSLO) 667 MG capsule Take 2 capsules (1,334 mg total) by mouth 3 (three) times daily with meals. 180 capsule 0  . Continuous  Blood Gluc Receiver ([place]) [place] 1 each by Does not apply route See admin instructions. Use Dexcom Receiver to monitor blood sugar continuously. 1 each 0  . Continuous Blood Gluc Sensor (DEXCOM G6 SENSOR) MISC 1 each by Does not apply route See admin instructions. Use one sensors once every 10 days to monitor blood sugars. 9 each 3  . Continuous Blood Gluc Transmit (DEXCOM G6 TRANSMITTER) MISC 1 each by Does not apply route See admin instructions. Use one transmitter once every 90 days. 1 each 3  . CONTOUR NEXT TEST test strip USE  AS INSTRUCTED TO CHECK BLOOD SUGAR 4 TIMES A DAY. DX E10.65 100 strip 4  . Cyanocobalamin (B-12) 5000 MCG CAPS Take 5,000 mcg by mouth daily.    . diphenhydrAMINE (BENADRYL) 25 MG tablet Take 25 mg by mouth daily as needed.     . folic acid (FOLVITE) 638 MCG tablet Take 400 mcg by mouth daily.    . insulin aspart (NOVOLOG) 100 UNIT/ML injection USE A MAX OF 65 UNITS DAILY VIA INSULIN PUMP. 10 mL 2  . Insulin Human (INSULIN PUMP) SOLN Inject into the skin. Insulin aspart (Novolog) 100 unit/ml---MAX 65 UNITS DAILY    . norethindrone (AYGESTIN) 5 MG tablet Take 5 mg by mouth See admin instructions.     . Omega-3 Fatty Acids (OMEGA 3 500) 500 MG CAPS Take 500 mg by mouth daily.    . pravastatin (PRAVACHOL) 40 MG tablet TAKE 1 TABLET BY MOUTH EVERY DAY (Patient taking differently: Take 40 mg by mouth daily.) 90 tablet 1  . sulfamethoxazole-trimethoprim (BACTRIM DS) 800-160 MG tablet Take 1 tablet by mouth daily.     No current facility-administered medications for this visit.    Allergies:   Cleocin [clindamycin hcl], Lisinopril, Amoxicillin, and Bactrim [sulfamethoxazole-trimethoprim]    Social History:  The patient  reports that she quit smoking about 11 years ago. She quit after 15.00 years of use. She has never used smokeless tobacco. She reports previous alcohol use. She reports that she does not use drugs.   Family History:  The patient's family history  includes Dementia in her mother; Hyperlipidemia in her mother.    ROS:  Please see the history of present illness.   Otherwise, review of systems are positive for none.   All other systems are reviewed and negative.    PHYSICAL EXAM: VS:  BP 112/68 (BP Location: Right Arm, Patient Position: Sitting, Cuff Size: Normal)   Pulse 81   Ht 5\' 7"  (1.702 m)   Wt 128 lb (58.1 kg)   BMI 20.05 kg/m  , BMI Body mass index is 20.05 kg/m. GENERAL:  Well appearing HEENT:  Pupils equal round and reactive, fundi not visualized, oral mucosa unremarkable NECK:  No jugular venous distention, waveform within normal limits, carotid upstroke brisk and symmetric, no bruits, no thyromegaly LYMPHATICS:  No cervical, inguinal adenopathy LUNGS:  Clear to auscultation bilaterally BACK:  No CVA tenderness CHEST:  Unremarkable HEART:  PMI not displaced or sustained,S1 and S2 within normal limits, no S3, no S4, no clicks, no rubs, 2 out of 6 holosystolic murmur heard through the entire precordium abdomen the neck, no diastolic murmurs ABD:  Flat, positive bowel sounds normal in frequency in pitch, no bruits, no rebound, no guarding, no midline pulsatile mass, no hepatomegaly, no splenomegaly EXT:  2 plus pulses upper and decreased DP/PT bilateral, no edema, no cyanosis no clubbing, status post transmetatarsal amputations on the right and and left great toe/fifth toe amputation on the left, fistula in the left arm SKIN:  No rashes no nodules NEURO:  Cranial nerves II through XII grossly intact, motor grossly intact throughout PSYCH:  Cognitively intact, oriented to person place and time    EKG:  EKG is ordered today. The ekg ordered today demonstrates sinus rhythm, rate 80, axis within normal limits, intervals within normal limits, no acute ST-T wave changes.   Recent Labs: 04/10/2020: ALT 50; BUN 66; Creatinine, Ser 5.55; Hemoglobin 9.5; Platelets 318; Potassium 4.6; Sodium 137    Lipid Panel    Component  Value  Date/Time   CHOL 152 06/12/2018 1522   TRIG 222.0 (H) 06/12/2018 1522   HDL 24.40 (L) 06/12/2018 1522   CHOLHDL 6 06/12/2018 1522   VLDL 44.4 (H) 06/12/2018 1522   LDLCALC 78 05/02/2016 1342   LDLDIRECT 82.0 06/12/2018 1522      Wt Readings from Last 3 Encounters:  04/28/20 128 lb (58.1 kg)  04/10/20 152 lb (68.9 kg)  02/17/20 128 lb 6.4 oz (58.2 kg)      Other studies Reviewed: Additional studies/ records that were reviewed today include:    Renal records. Review of the above records demonstrates:  Please see elsewhere in the note.     ASSESSMENT AND PLAN:  CHEST PAIN:    Her chest pain is likely non anginal but she has significant risk factors.  I would like to screen her with a stress test.  I think because of her amputations treadmill testing would be difficult so I will try to have her come in and get a Lexiscan Myoview.  HTN: Her blood pressure has been very labile and elevated recently but seems to be well controlled currently.  No change in therapy.  DYSLIPIDEMIA: I do not see a recent lipid profile.  I will order this.  I like to try to have this done with dialysis.   Current medicines are reviewed at length with the patient today.  The patient does not have concerns regarding medicines.  The following changes have been made:  no change  Labs/ tests ordered today include:   Orders Placed This Encounter  Procedures  . Lipid panel  . MYOCARDIAL PERFUSION IMAGING  . EKG 12-Lead     Disposition:   FU with me as needed based on results of the above testing   Signed, Minus Breeding, MD  04/28/2020 1:59 PM    [facility]

## 2020-04-27 NOTE — Progress Notes (Signed)
[name], [name] ([id number]) Visit Report for 04/22/2020 Arrival Information Details Patient Name: Date of Service: Nancy LLO [name] 04/22/2020 12:30 PM Medical Record Number: [id number] Patient Account Number: [id number] Date of Birth/Sex: Treating RN: Jun 16, 1970 (50 y.o. [name], [name] Primary Care Corbet Hanley: [name], [name] Other Clinician: Referring Javonni Macke: Treating Alecea Trego/Extender: [name], [name] Weeks in Treatment: 71 Visit Information History Since Last Visit Added or deleted any medications: No Patient Arrived: Wheel Chair Any new allergies or adverse reactions: No Arrival Time: 12:54 Had a fall or experienced change in No Accompanied By: self activities of daily living that may affect Transfer Assistance: None risk of falls: Patient Identification Verified: Yes Signs or symptoms of abuse/neglect since last visito No Secondary Verification Process Completed: Yes Hospitalized since last visit: Yes Patient Requires Transmission-Based Precautions: No Implantable device outside of the clinic excluding No Patient Has Alerts: No cellular tissue based products placed in the center since last visit: Has Dressing in Place as Prescribed: Yes Pain Present Now: No Electronic Signature(s) Signed: 04/27/2020 5:56:51 PM By: Rhae Hammock RN Entered By: Rhae Hammock on 04/22/2020 13:05:21 -------------------------------------------------------------------------------- Clinic Level of Care Assessment Details Patient Name: Date of Service: Nancy Nancy Shi [name] 04/22/2020 12:30 PM Medical Record Number: [id number] Patient Account Number: [id number] Date of Birth/Sex: Treating RN: [birth year] (50 y.o. [name] Primary Care Chauncey Bruno: [name], [name] Other Clinician: Referring Aidin Doane: Treating Lorence Nagengast/Extender: [name], [name] Weeks in Treatment: 92 Clinic Level of Care Assessment Items TOOL 4 Quantity  Score []  - 0 Use when only an EandM is performed on FOLLOW-UP visit ASSESSMENTS - Nursing Assessment / Reassessment X- 1 10 Reassessment of Co-morbidities (includes updates in patient status) X- 1 5 Reassessment of Adherence to Treatment Plan ASSESSMENTS - Wound and Skin A ssessment / Reassessment X - Simple Wound Assessment / Reassessment - one wound 1 5 []  - 0 Complex Wound Assessment / Reassessment - multiple wounds []  - 0 Dermatologic / Skin Assessment (not related to wound area) ASSESSMENTS - Focused Assessment X- 1 5 Circumferential Edema Measurements - multi extremities []  - 0 Nutritional Assessment / Counseling / Intervention X- 1 5 Lower Extremity Assessment (monofilament, tuning fork, pulses) []  - 0 Peripheral Arterial Disease Assessment (using hand held doppler) ASSESSMENTS - Ostomy and/or Continence Assessment and Care []  - 0 Incontinence Assessment and Management []  - 0 Ostomy Care Assessment and Management (repouching, etc.) PROCESS - Coordination of Care X - Simple Patient / Family Education for ongoing care 1 15 []  - 0 Complex (extensive) Patient / Family Education for ongoing care X- 1 10 Staff obtains Programmer, systems, Records, T Results / Process Orders est X- 1 10 Staff telephones HHA, Nursing Homes / Clarify orders / etc []  - 0 Routine Transfer to another Facility (non-emergent condition) []  - 0 Routine Hospital Admission (non-emergent condition) []  - 0 New Admissions / Biomedical engineer / Ordering NPWT Apligraf, etc. , []  - 0 Emergency Hospital Admission (emergent condition) X- 1 10 Simple Discharge Coordination []  - 0 Complex (extensive) Discharge Coordination PROCESS - Special Needs []  - 0 Pediatric / Minor Patient Management []  - 0 Isolation Patient Management []  - 0 Hearing / Language / Visual special needs []  - 0 Assessment of Community assistance (transportation, D/C planning, etc.) []  - 0 Additional assistance / Altered  mentation []  - 0 Support Surface(s) Assessment (bed, cushion, seat, etc.) INTERVENTIONS - Wound Cleansing / Measurement X - Simple Wound Cleansing -  one wound 1 5 []  - 0 Complex Wound Cleansing - multiple wounds X- 1 5 Wound Imaging (photographs - any number of wounds) []  - 0 Wound Tracing (instead of photographs) X- 1 5 Simple Wound Measurement - one wound []  - 0 Complex Wound Measurement - multiple wounds INTERVENTIONS - Wound Dressings X - Small Wound Dressing one or multiple wounds 1 10 []  - 0 Medium Wound Dressing one or multiple wounds []  - 0 Large Wound Dressing one or multiple wounds X- 1 5 Application of Medications - topical []  - 0 Application of Medications - injection INTERVENTIONS - Miscellaneous []  - 0 External ear exam []  - 0 Specimen Collection (cultures, biopsies, blood, body fluids, etc.) []  - 0 Specimen(s) / Culture(s) sent or taken to Lab for analysis []  - 0 Patient Transfer (multiple staff / Civil Service fast streamer / Similar devices) []  - 0 Simple Staple / Suture removal (25 or less) []  - 0 Complex Staple / Suture removal (26 or more) []  - 0 Hypo / Hyperglycemic Management (close monitor of Blood Glucose) []  - 0 Ankle / Brachial Index (ABI) - do not check if billed separately X- 1 5 Vital Signs Has the patient been seen at the hospital within the last three years: Yes Total Score: 110 Level Of Care: New/Established - Level 3 Electronic Signature(s) Signed: 04/22/2020 4:31:51 PM By: Baruch Gouty RN, BSN Entered By: Baruch Gouty on 04/22/2020 13:10:51 -------------------------------------------------------------------------------- Encounter Discharge Information Details Patient Name: Date of Service: Nancy LLO Nancy [address] [name] 04/22/2020 12:30 PM Medical Record Number: [id number] Patient Account Number: [id number] Date of Birth/Sex: Treating RN: 01/11/1971 (50 y.o. [name] Primary Care Hollee Fate: [name], [name] Other Clinician: Referring  Cloteal Isaacson: Treating Brennah Quraishi/Extender: [name], [name] Weeks in Treatment: 29 Encounter Discharge Information Items Discharge Condition: Stable Ambulatory Status: Wheelchair Discharge Destination: Home Transportation: Private Auto Accompanied By: self Schedule Follow-up Appointment: Yes Clinical Summary of Care: Electronic Signature(s) Signed: 04/22/2020 4:31:03 PM By: Deon Pilling Entered By: Deon Pilling on 04/22/2020 13:27:34 -------------------------------------------------------------------------------- Lower Extremity Assessment Details Patient Name: Date of Service: Nancy Nancy Shi [name] 04/22/2020 12:30 PM Medical Record Number: [id number] Patient Account Number: [id number] Date of Birth/Sex: Treating RN: 1970/04/11 (50 y.o. [name], [name] Primary Care Rodgerick Gilliand: [name], [name] Other Clinician: Referring Hawkins Seaman: Treating Kaliope Quinonez/Extender: [name], [name] Weeks in Treatment: 60 Edema Assessment Assessed: [Left: No] [Right: Yes] Edema: [Left: N] [Right: o] Calf Left: Right: Point of Measurement: From Medial Instep 27.55 cm Ankle Left: Right: Point of Measurement: From Medial Instep 19 cm Vascular Assessment Pulses: Dorsalis Pedis Palpable: [Right:Yes] Posterior Tibial Palpable: [Right:Yes] Electronic Signature(s) Signed: 04/27/2020 5:56:51 PM By: Rhae Hammock RN Entered By: Rhae Hammock on 04/22/2020 12:57:12 -------------------------------------------------------------------------------- Multi-Disciplinary Care Plan Details Patient Name: Date of Service: Nancy LLO [name] 04/22/2020 12:30 PM Medical Record Number: [id number] Patient Account Number: [id number] Date of Birth/Sex: Treating RN: 11/10/1970 (50 y.o. [name] Primary Care Yanira Tolsma: [name], [name] Other Clinician: Referring Jamese Trauger: Treating Egbert Seidel/Extender: [name], [name] Weeks in Treatment: 50 Active  Inactive Wound/Skin Impairment Nursing Diagnoses: Impaired tissue integrity Knowledge deficit related to ulceration/compromised skin integrity Goals: Patient/caregiver will verbalize understanding of skin care regimen Date Initiated: 09/03/2018 Target Resolution Date: 05/20/2020 Goal Status: Active Ulcer/skin breakdown will have a volume reduction of 30% by week 4 Date Initiated: 09/03/2018 Date Inactivated: 10/07/2018 Target Resolution Date: 10/01/2018 Goal Status: Unmet Unmet Reason: COMORBITIES Ulcer/skin  breakdown will have a volume reduction of 50% by week 8 Date Initiated: 10/07/2018 Date Inactivated: 11/03/2018 Target Resolution Date: 10/31/2018 Goal Status: Unmet Unmet Reason: Osteomyelitis Interventions: Assess patient/caregiver ability to obtain necessary supplies Assess patient/caregiver ability to perform ulcer/skin care regimen upon admission and as needed Assess ulceration(s) every visit Provide education on ulcer and skin care Treatment Activities: Skin care regimen initiated : 09/03/2018 Topical wound management initiated : 09/03/2018 Notes: Electronic Signature(s) Signed: 04/22/2020 4:31:51 PM By: Baruch Gouty RN, BSN Entered By: Baruch Gouty on 04/22/2020 13:07:26 -------------------------------------------------------------------------------- Pain Assessment Details Patient Name: Date of Service: Nancy Nancy Shi [name] 04/22/2020 12:30 PM Medical Record Number: [id number] Patient Account Number: [id number] Date of Birth/Sex: Treating RN: 11/18/1970 (50 y.o. [name], [name] Primary Care Yanilen Adamik: [name], [name] Other Clinician: Referring Silvanna Ohmer: Treating Altin Sease/Extender: [name], [name] Weeks in Treatment: 56 Active Problems Location of Pain Severity and Description of Pain Patient Has Paino No Site Locations Rate the pain. Current Pain Level: 0 Pain Management and Medication Current Pain Management: Electronic  Signature(s) Signed: 04/27/2020 5:56:51 PM By: Rhae Hammock RN Entered By: Rhae Hammock on 04/22/2020 12:55:15 -------------------------------------------------------------------------------- Patient/Caregiver Education Details Patient Name: Date of Service: Nancy LLO [name] 1/21/2022andnbsp12:30 PM Medical Record Number: [id number] Patient Account Number: [id number] Date of Birth/Gender: Treating RN: 11-03-1970 (50 y.o. [name] Primary Care Physician: [name], [name] Other Clinician: Referring Physician: Treating Physician/Extender: [name], [name] Weeks in Treatment: 17 Education Assessment Education Provided To: Patient Education Topics Provided Wound/Skin Impairment: Methods: Explain/Verbal Responses: Reinforcements needed, State content correctly Electronic Signature(s) Signed: 04/22/2020 4:31:51 PM By: Baruch Gouty RN, BSN Entered By: Baruch Gouty on 04/22/2020 13:07:46 -------------------------------------------------------------------------------- Wound Assessment Details Patient Name: Date of Service: Nancy Nancy Shi [name] 04/22/2020 12:30 PM Medical Record Number: [id number] Patient Account Number: [id number] Date of Birth/Sex: Treating RN: [birth year] (50 y.o. [name], [name] Primary Care Odis Wickey: [name], [name] Other Clinician: Referring Ajamu Maxon: Treating Keundra Petrucelli/Extender: [name], [name] Weeks in Treatment: 4 Wound Status Wound Number: [id number] Primary Diabetic Wound/Ulcer of the Lower Extremity Etiology: Wound Location: Right, Medial Foot Wound Open Wounding Event: Gradually Appeared Status: Date Acquired: 08/04/2018 Comorbid Cataracts, Chronic sinus problems/congestion, Anemia, Sleep Weeks Of Treatment: 85 History: Apnea, Deep Vein Thrombosis, Hypertension, Peripheral Arterial Clustered Wound: Yes Disease, Type I Diabetes, Osteoarthritis, Osteomyelitis, Neuropathy, Seizure  Disorder Photos Photo Uploaded By: [name] on 04/27/2020 13:41:07 Wound Measurements Length: (cm) 3.4 Width: (cm) 1.9 Depth: (cm) 0.2 Area: (cm) 5.074 Volume: (cm) 1.015 % Reduction in Area: 93% % Reduction in Volume: 95.3% Epithelialization: Small (1-33%) Tunneling: No Undermining: No Wound Description Classification: Grade 4 Wound Margin: Flat and Intact Exudate Amount: Medium Exudate Type: Serosanguineous Exudate Color: red, brown Foul Odor After Cleansing: No Slough/Fibrino Yes Wound Bed Granulation Amount: Medium (34-66%) Exposed Structure Granulation Quality: Pink Fascia Exposed: No Necrotic Amount: Medium (34-66%) Fat Layer (Subcutaneous Tissue) Exposed: Yes Necrotic Quality: Adherent Slough Tendon Exposed: No Muscle Exposed: No Joint Exposed: No Bone Exposed: No Treatment Notes Wound #43 (Foot) Wound Laterality: Right, Medial Cleanser Wound Cleanser Discharge Instruction: Cleanse the wound with wound cleanser or normal saline prior to applying a clean dressing using gauze sponges, not tissue or cotton balls. Peri-Wound Care Topical Primary Dressing MediHoney Calcium Alginate Dressing 4x5 in Discharge Instruction: Apply to wound bed as instructed Secondary Dressing Woven Gauze Sponge, Non-Sterile 4x4 in Discharge Instruction: Apply over primary dressing as  directed. Secured With Elastic Bandage 4 inch (ACE bandage) Discharge Instruction: Secure with ACE bandage as directed. Kerlix Roll Sterile, 4.5x3.1 (in/yd) Discharge Instruction: Secure with Kerlix as directed. Paper Tape, 2x10 (in/yd) Discharge Instruction: Secure dressing with tape as directed. Compression Wrap Compression Stockings Add-Ons Electronic Signature(s) Signed: 04/27/2020 5:56:51 PM By: Rhae Hammock RN Entered By: Rhae Hammock on 04/22/2020 13:00:18 -------------------------------------------------------------------------------- Vitals Details Patient Name: Date  of Service: Nancy LLO [place] [name] 04/22/2020 12:30 PM Medical Record Number: [id number] Patient Account Number: [id number] Date of Birth/Sex: Treating RN: 1970-09-09 (50 y.o. [name], [name] Primary Care Meyer Arora: [name], [name] Other Clinician: Referring America Sandall: Treating Kennis Wissmann/Extender: [name], [name] Weeks in Treatment: 38 Vital Signs Time Taken: 12:54 Temperature (F): 98.3 Height (in): 67 Pulse (bpm): 90 Weight (lbs): 125 Respiratory Rate (breaths/min): 17 Body Mass Index (BMI): 19.6 Blood Pressure (mmHg): 155/88 Reference Range: 80 - 120 mg / dl Electronic Signature(s) Signed: 04/27/2020 5:56:51 PM By: Rhae Hammock RN Entered By: Rhae Hammock on 04/22/2020 12:55:04

## 2020-04-28 ENCOUNTER — Encounter: Payer: Self-pay | Admitting: Cardiology

## 2020-04-28 ENCOUNTER — Ambulatory Visit (INDEPENDENT_AMBULATORY_CARE_PROVIDER_SITE_OTHER): Payer: Medicare HMO | Admitting: Cardiology

## 2020-04-28 ENCOUNTER — Other Ambulatory Visit: Payer: Self-pay

## 2020-04-28 VITALS — BP 112/68 | HR 81 | Ht 67.0 in | Wt 128.0 lb

## 2020-04-28 DIAGNOSIS — E1051 Type 1 diabetes mellitus with diabetic peripheral angiopathy without gangrene: Secondary | ICD-10-CM

## 2020-04-28 DIAGNOSIS — I10 Essential (primary) hypertension: Secondary | ICD-10-CM | POA: Diagnosis not present

## 2020-04-28 DIAGNOSIS — R072 Precordial pain: Secondary | ICD-10-CM

## 2020-04-28 DIAGNOSIS — E785 Hyperlipidemia, unspecified: Secondary | ICD-10-CM

## 2020-04-28 NOTE — Patient Instructions (Addendum)
Medication Instructions:  Your physician recommends that you continue on your current medications as directed. Please refer to the Current Medication list given to you today.   Labwork: FASTING LIPIDS AT DIALYSIS SOON   Testing/Procedures: Your physician has requested that you have a lexiscan myoview. For further information please visit [email or web address]. Please follow instruction sheet, as given.  Follow-Up: AS NEEDED

## 2020-05-06 ENCOUNTER — Ambulatory Visit: Payer: Medicare HMO | Admitting: Endocrinology

## 2020-05-13 ENCOUNTER — Encounter (HOSPITAL_BASED_OUTPATIENT_CLINIC_OR_DEPARTMENT_OTHER): Payer: Medicare HMO | Attending: Internal Medicine | Admitting: Internal Medicine

## 2020-05-13 ENCOUNTER — Ambulatory Visit: Payer: Medicare HMO | Admitting: Endocrinology

## 2020-05-13 ENCOUNTER — Other Ambulatory Visit: Payer: Self-pay

## 2020-05-13 DIAGNOSIS — Z992 Dependence on renal dialysis: Secondary | ICD-10-CM | POA: Insufficient documentation

## 2020-05-13 DIAGNOSIS — Z9641 Presence of insulin pump (external) (internal): Secondary | ICD-10-CM | POA: Diagnosis not present

## 2020-05-13 DIAGNOSIS — I120 Hypertensive chronic kidney disease with stage 5 chronic kidney disease or end stage renal disease: Secondary | ICD-10-CM | POA: Insufficient documentation

## 2020-05-13 DIAGNOSIS — Z87891 Personal history of nicotine dependence: Secondary | ICD-10-CM | POA: Diagnosis not present

## 2020-05-13 DIAGNOSIS — L97411 Non-pressure chronic ulcer of right heel and midfoot limited to breakdown of skin: Secondary | ICD-10-CM | POA: Diagnosis not present

## 2020-05-13 DIAGNOSIS — L02415 Cutaneous abscess of right lower limb: Secondary | ICD-10-CM | POA: Insufficient documentation

## 2020-05-13 DIAGNOSIS — N186 End stage renal disease: Secondary | ICD-10-CM | POA: Insufficient documentation

## 2020-05-13 DIAGNOSIS — G40909 Epilepsy, unspecified, not intractable, without status epilepticus: Secondary | ICD-10-CM | POA: Insufficient documentation

## 2020-05-13 DIAGNOSIS — Z86718 Personal history of other venous thrombosis and embolism: Secondary | ICD-10-CM | POA: Diagnosis not present

## 2020-05-13 DIAGNOSIS — E1022 Type 1 diabetes mellitus with diabetic chronic kidney disease: Secondary | ICD-10-CM | POA: Insufficient documentation

## 2020-05-13 DIAGNOSIS — H49 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, unspecified eye: Secondary | ICD-10-CM | POA: Insufficient documentation

## 2020-05-13 DIAGNOSIS — Z888 Allergy status to other drugs, medicaments and biological substances status: Secondary | ICD-10-CM | POA: Diagnosis not present

## 2020-05-13 DIAGNOSIS — Z881 Allergy status to other antibiotic agents status: Secondary | ICD-10-CM | POA: Insufficient documentation

## 2020-05-13 DIAGNOSIS — E1042 Type 1 diabetes mellitus with diabetic polyneuropathy: Secondary | ICD-10-CM | POA: Insufficient documentation

## 2020-05-13 DIAGNOSIS — E10621 Type 1 diabetes mellitus with foot ulcer: Secondary | ICD-10-CM | POA: Insufficient documentation

## 2020-05-13 DIAGNOSIS — L97529 Non-pressure chronic ulcer of other part of left foot with unspecified severity: Secondary | ICD-10-CM | POA: Insufficient documentation

## 2020-05-13 LAB — GLUCOSE, CAPILLARY: Glucose-Capillary: 594 mg/dL (ref 70–99)

## 2020-05-13 NOTE — Progress Notes (Signed)
DESARAE, PLACIDE ([id number]) Visit Report for 05/13/2020 HPI Details Patient Name: Date of Service: [name] 05/13/2020 9:00 [place]. Record Number: [id number] Patient Account Number: [id number] Date of Birth/Sex: Treating RN: [birth year] (50 y.o. [name] Primary Care Provider: [name], [name] Other Clinician: Referring Provider: Treating Provider/Extender: [name], [name] Weeks in Treatment: 41 History of Present Illness HPI Description: 50 year old diabetic who is known to have type 1 diabetes which is poorly controlled last hemoglobin A1c was 11%. She comes in with a ulcerated area on the left lateral foot which has been there for over 6 months. Was recently she has been treated by [name] of podiatry who saw her last on 05/28/2016. Review of his notes revealed that the patient had incision and drainage with placement of antibiotic beads to the left foot on 04/11/2016 for possible osteomyelitis of the cuboid bone. Over the last year she's had a history of amputation of the left fifth toe and a femoropopliteal popliteal bypass graft somewhere in April 2017. 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 His note [name] mentions that the patient has been referred to me for further wound care and possibly great candidate for hyperbaric oxygen therapy due to recurrent osteomyelitis. However we do not have any x-rays of biopsy reports confirming this. He has been on several antibiotics including Bactrim and most recently is on doxycycline for an MRSA. I understand, the patient was not a candidate for IV antibiotics as she has had previous PICC lines which resulted in blood clots in both arms. There was a x-ray report dated 04/04/2016 on [name] notes which showed evidence of fifth ray resection left foot with osteolytic changes noted to the fourth metatarsal and cuboid bone on the left. 06/13/2016 -- had a left foot x-ray which  showed no acute fracture or dislocation and no definite radiographic evidence of osteomyelitis. Advanced osteopenia was seen. 06/20/2016 -- she has noticed a new wound on the right plantar foot in the region where she had a callus before. 06/27/16- the patient did have her x-ray of the right foot which showed no findings to suggest osteomyelitis. She saw her endocrinologist, [name], yesterday. Her A1c in January was 11. He also indicates mismanagement and noncompliance regarding her diabetes. She is currently on Bactrim for a lip infection. She is complaining of nausea, vomiting and diarrhea. She is unable to articulate the exact orders or dosing of the Bactrim; it is unclear when she will complete this. 07/04/2016 -- results from [facility] of ABIs with ankle waveforms were noted from 02/14/2016. The examination done on 06/27/2015 showed noncompressible ABIs with the right being 1.45 and the left being 1.33. The present examination showed a right ABI of 1.19 on the left of 1.33. The conclusion was that right normal ABI in the lower extremity at rest however compared to previous study which was noncompressible ABI may be falsely elevated side suggesting medial calcification. The left ABI suggested medial calcification. 08/01/2016 -- the patient had more redness and pain on her right foot and did not get to come to see as noted she see her PCP or go to the ER and decided to take some leftover metronidazole which she had at home. As usual, the patient does report she feels and is rather noncompliant. 08/08/2016 -- -- x-ray of the right foot -- FINDINGS:Transmetatarsal amputation is noted. No bony destruction is noted to suggest osteomyelitis. IMPRESSION: No evidence  of osteomyelitis. Postsurgical changes are seen. MRI would be more sensitive for possible bony changes. Culture has grown Serratia Marcescens -- sensitive to Bactrim, ciprofloxacin, ceftazidime she was seen by [name] on  08/06/2016. He did not find any exposed bone, muscle, tendon, ligament or joint. There was no malodor and he did a excisional debridement in the office. ============ Old notes: 50 year old patient who is known to the wound clinic for a while had been away from the wound clinic since 09/01/2014. Over the last several months she has been admitted to various hospitals including [place] at [place]. She was treated for a right metatarsal osteomyelitis with a transmetatarsal amputation and this was done about 2 months ago. He has a small ulcerated area on the right heel and she continues to have an ulcerated area on the left plantar aspect of the foot. The patient was recently admitted to the [facility] hospital group between 7/12 and 10/18/2014. she was given 3 weeks of IV vancomycin and was to follow-up with her surgeons at [facility] and also took oral vancomycin for C. difficile colitis. 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 MRI of the left foot done on 10/14/2014 showed no abscess or osteomyelitis. 04/27/15; this is a patient we know from previous stays in the wound care center. She is a type I diabetic I am not sure of her control currently. Since the last time I saw her she is had a right transmetatarsal amputation and has no wounds on her right foot and has no open wounds. She is been followed at the wound care center at [facility] in [place]. She comes today with the desire to undergo hyperbaric treatment locally. Apparently one of her wound care providers in [place] has suggested hyperbarics. This is in response to an MRI from 04/18/15 that showed increased marrow signal and loss of the proximal  fifth metatarsal cortex evidence of osteomyelitis with likely early osteomyelitis in the cuboid bone as well. She has a large wound over the base of the fifth metatarsal. She also has a eschar over her the tips of her toes on 1,3 and 5. She does not have peripheral pulses and apparently is going for an angiogram tomorrow which seems reasonable. After this she is going to infectious disease at [facility]. They have been using Medihoney to the large wound on the lateral aspect of the left foot to. The patient has known Charcot deformity from diabetic neuropathy. She also has known diabetic PAD. Surprisingly I can't see that she has had any recent antibiotics, the patient states the last antibiotic she had was at the end of November for 10 days. I think this was in response to culture that showed group G strep although I'm not exactly sure where the culture was from. She is also had arterial studies on 03/29/15. This showed a right ABI of 1.4 that was noncompressible. Her left ABI was 0.73. 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 It was not felt that arterial inflow was adequate for healing of a foot ulcer. Her Doppler waveforms looked monophasic ===== READMISSION 02/28/17; this is in an now 50 year old woman we've had at several different occasions in this clinic. 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 She  has a remote ex-smoker. She was last seen in this clinic by Dr. Con Memos I think in May. More recently she is been followed by her podiatrist [name] an infectious disease Dr. Megan Salon. She has 2 open wounds the major one is over the right first metatarsal head she also has a wound on the left plantar foot. an MRI of the right foot on 01/01/17 showed a soft tissue ulcer along the plantar aspect of the first metatarsal base consistent with osteomyelitis of the first metatarsal stump. Dr. 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 According to the patient this is been open for slightly over a month. She has been on a combination of Cipro 500 twice a day, Zyvox 600 twice a day and Flagyl 500 3 times a day for over a month now as directed by Dr. Megan Salon. cultures of the right foot earlier this year showed MRSA in January and Serratia in May. January also had a few viridans strep. Recent x-rays of both feet were done and [name] office and I don't have these reports. The patient has known PAD and has a history of aleft femoropopliteal bypass in April 2017. 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 Dwyane Dee. In spite of this the last hemoglobin A1c I can see is 10.1 on 01/01/2017. She is being referred by 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 She is been using Medihoney to this area and also an area on the left midfoot. She is using healing sandals bilaterally. ABIs in this clinic at the left posterior tibial was 1.1 noncompressible on the right READMISSION Non invasive vascular NOVANT 5/18 Aftercare following surgery of the circulatory system Procedure Note - Interface, External Ris In - 08/13/2016 11:05 AM EDT Procedure: Examination consists of physiologic resting arterial pressures of the brachial and ankle arteries bilaterally with continuous wave Doppler waveform analysis. Previous: Previous exam performed on 02/14/16 demonstrated ABIs of Rt = 1.19 and Lt = 1.33. Right: ABI = non-compressible PT 1.47 DP. S/P transmet amputation. , Left: ABI = 1.52, 2nd digit pressure = 87 mmHg Conclusions: Right: ABI (>1.3) may be falsely elevated, suggesting medial calcification. Left: ABI (>1.3) may be falsely  elevated, suggesting medial calcification The patient is a now 50 year old type I diabetic is had multiple issues her graded to chronic diabetic foot ulcers. 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 We had her in the clinic lastin November. At that point she had wounds on her bilateral feet.she had wanted to try hyperbarics however the healogics review process denied her because she hadn't followed up with her vascular surgeon for her left femoropopliteal bypass. The bypass was done by Dr. Raul Del at [facility]. We made her a follow-up with Dr. Raul Del however she did not keep the appointment and therefore she was not approved The patient shows me a small wound on her left fourth metatarsal head on her phone. She developed rapid discoloration in the plantar aspect of the left foot and she was admitted to hospital from 2/2 through 05/10/17 with wet gangrene of the left foot osteomyelitis of the fourth metatarsal heads. She was admitted acutely ill with a temperature of  103. She was started on broad-spectrum vancomycin and cefepime. On 05/06/17 she was taken to the OR by [name] her podiatric surgeon for an incision and drainage irrigation of the left foot wound. Cultures from this surgery revealed group be strep and anaerobes. she was seen by Dr.Xu of orthopedic surgery and scheduled for a below-knee amputation which she u refused. Ultimately she was discharged on Levaquin and Flagyl for one month. MRI 05/05/17 done while she was in the hospital showed abscess adjacent to the fourth metatarsal head and neck small abscess around the fourth flexor tendon. Inflammatory phlegmon and gas in the soft tissues along the lateral aspect of the fourth phalanx. Findings worrisome for osteomyelitis involving the fourth proximal and middle phalanx and also the third and fourth metatarsals. Finally the patient had actually shortly  before this followed up with Dr. Raul Del at no time on 04/29/17. He felt that her left femoropopliteal bypass was patent he felt that her left-sided toe pressures more than adequate for healing a wound on the left foot. This was before her acute presentation. 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 05/28/17; she is started hyperbarics. The patient tells me that for some reason she was not actually on Levaquin but I think on ciprofloxacin. She was on Flagyl. She only started her Levaquin yesterday due to some difficulty with the pharmacy and perhaps her sister picking it up. She has an appointment with [name] tomorrow and with infectious disease early next week. She has no new complaints 06/06/17; the patient continues in hyperbarics. She saw [name] on 05/29/17 who is her podiatric surgeon. He is elected for a transmetatarsal amputation on 06/27/17. I'm not sure at what level he plans to do this amputation. The patient is unaware She also saw Dr. Megan Salon of infectious disease who elected to continue her on current antibiotics I think this is ciprofloxacin and Flagyl. I'll need to clarify with her tomorrow if she actually has this. We're using silver alginate to the actual wound. Necrotic surface today with material under the flap of her foot. Original MRI showed abscesses as well as osteomyelitis of the proximal and middle fourth phalanx and the third and fourth metatarsal heads 06/11/17; patient continues in hyperbarics and continues on oral antibiotics. She is doing well. The wound looks better. The necrotic part of this under the flap in her superior foot also looks better. she is been to see [name]. I haven't had a chance to look at his note. Apparently he has put the transmetatarsal amputation on hold her request it is still planning to take her to the OR for debridement and product application ACEL. I'll see if I can find his note. I'll therefore leave product ordering/requests to [name]  for now. I was going to look at Dermagraft 06/18/17-she is here in follow-up evaluation for bilateral foot wounds. She continues with hyperbaric therapy. She states she has been applying manuka honey to the right plantar foot and alternate manuka honey and silver alginate to the left foot, despite our orders. We will continue with same treatment plan and she will follo up next week. 06/25/17; I have reviewed [name] last note from 3/11. She has operative debridement in 2 days' time. By review his note apparently they're going to place there is skin over the majority of this wound which is a good choice. She has a small satellite area at the most proximal part of this wound on the left plantar foot. The area on the  right plantar foot we've been using silver alginate and it is close to healing. 07/02/17; unfortunately the patient was not easily approved for [name] proposed surgery. I'm not completely certain what the issue is. She has been using silver alginate to the wound she has completed a first course of hyperbarics. She is still on Levaquin and Flagyl. I have really lost track of the time course here.I suspect she should have another week to 2 of antibiotics. I'll need to see if she is followed up with infectious disease Dr. Megan Salon 07/09/17; the patient is followed up with Dr. Megan Salon. 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 She continues on Levaquin and metronidazole continuing both of these for now I think she is been on fr about 6 weeks. She still has some drainage but no pain. No fever. Her had been plans for her to go to the OR for operative debridement with her podiatrist [name], I am not exactly sure where that is. I'll probably slip a note to [name] today. I note that she follows with Dr. Dwyane Dee of endocrinology. We have her recertified for hyperbaric oxygen. I have not heard about Dermagraft however I'll see if [name] is planning a  skin substitute as well 07/16/17; the patient tells me she is just about out of [place]. I'll need to check Dr. Hale Bogus last notes on this. She states she has plenty of Flagyl however. She comes in today complaining of pain in the right lateral foot which she said lasted for about a day. The wound on the right foot is actually much more medially. She also tells me that the [facility] cost a lot of pain in the left foot wound and she turned back to silver alginate. Finally Dermagraft has a $702 per application co-pay. She cannot afford this 07/23/17; patient arrives today with the wound not much smaller. There is not much new to add. She has not heard from [name] all try to put in a call to them today. She was asking about Dermagraft again and she has an over $637 per application co-pay she states that she would be willing to try to do a payment plan. I been tried to avoid this. We've been using silver alginate, I'll change to [facility] 07/30/17-She is here in follow-up evaluation for left foot ulcer. She continues hyperbaric medicine. The left foot ulcer is stable we will continue with same treatment plan 08/06/17; she is here for evaluation of her left foot ulcer. Currently being treated for hyperbarics or underlying osteomyelitis. She is completed antibiotics. The left foot ulcer is better smaller with healthier looking granulation. For various reasons I am not really clear on we never got her back to the OR with [name]. He did not respond to my secure text message. Nevertheless I think that surgery on this point is not necessary nor am I completely clear that a skin substitute is necessary The patient is complaining about pain on the outside of her right foot. 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 There is no erythema. She also states the foot is warm versus her other part of her upper leg and this is largely true. It is not totally clear to me what's causing this. She  thinks it's different from her usual neuropathy pain 08/13/17; she arrives in clinic today with a small wound which is superficial on her right first metatarsal head. She's had a previous transmetatarsal  amputation in this area. She tells [place] she was up on her feet over the Mother's Day celebration. The large wound is on the left foot. Continues with hyperbarics for underlying osteomyelitis. We're using Hydrofera Blue. She asked me today about where we were with Dermagraft. I had actually excluded this because of the co-pay however she wants to assume this therefore I'll recheck the co-pay an order for next week. 08/20/17; the patient agreed to accept the co-pay of the first Apligraf which we applied today. She is disappointed she is finishing hyperbarics will run this through the insurance on the extent of the foot infection and the extent of the wound that she had however she is already had 60 dive's. Dermagraft No. 1 08/27/17; Dermagraft No. 2. She is not eligible for any more hyperbaric treatments this month. She reports a fair amount of drainage and she actually changed to the external dressings without disturbing the direct contact layer 09/03/17; the patient arrived in clinic today with the wound superficially looking quite healthy. Nice vibrant red tissue with some advancing epithelialization although not as much adherence of the flap as I might like. However she noted on her own fourth toe some bogginess and she brought that to our attention. Indeed this was boggy feeling like a possibility of subcutaneous fluid. 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 She is not been unwell. We've been using Dermagraft 09/10/17; the culture that I did not last week was MRSA. She saw Dr. Megan Salon this morning who is going to start her on vancomycin. I had sent him a secure a text message yesterday. I also spoke with her podiatric surgeon  [name] about surgery on this foot the options for conserving a functional foot etc. Promised me he would see her and will make back consultation today. Paradoxically her actual wound on the plantar aspect of her left foot looks really quite good. I had given her 5 days worth of Baxdella to cover her for MRSA. Her MRI came back showing osteomyelitis within the third metatarsal shaft and head and base of the third and fourth proximal phalanx. She had extensive inflammatory changes throughout the soft tissue of the lateral forefoot. With an ill-defined fluid around the fourth metatarsal extending into the plantar and dorsal soft tissues 09/19/17; the patient is actually on oral Septra and Flagyl. She apparently refused IV vancomycin. She also saw [name] at my request who is planning her for a left BKA sometime in mid July. MRI showed osteomyelitis within the third metatarsal shaft and head and the basis of the third and fourth proximal phalanx. I believe there was felt to be possible septic arthritis involving the third MTP. 09/26/17; the patient went back to Dr. Megan Salon at my suggestion and is now receiving IV daptomycin. Her wound continues to look quite good making the decision to proceed with a transmetatarsal amputation although more difficult for the patient. I believe in my extensive discussions with her she has a good sense of the pros and cons of this. I don't NV the tuft decision she has to make. She has an appointment with [name] I believe in mid July and I previously spoken to him about this issue Has we had used 3 previous Dermagraft. Given the condition of the wound surface I went ahead and added the fourth one today area and I did this not fully realizing that she'll be  traveling to [place] next week. I'm hopeful she can come back in 2 weeks 10/21/17; Her same Dermagraft on for about 3-1/2 weeks. In spite of this the wound arrives looking quite healthy. There is been a lot of  healing dimensions are smaller. Looking at the square shaped wound she has now there is some undermining and some depth medially under the undermining although I cannot palpate any bone. No surrounding infection is obvious. She has difficult questions about how to look at this going forward vis--vis amputations versus continued medical therapy. T be truthful the wound is looks o so healthy and it is continued to contract. Hard to justify foot surgery at this point although I still told her that I think it might come to that if we are not able to eradicate the underlying MRSA. She is still highly at risk and she understands this 11/06/17 on evaluation today patient appears to be doing better in regard to her foot ulcer. She's been tolerating the dressing changes without complication. Currently she is here for her Dermagraft #6. Her wound continues to make excellent progress at this point. She does not appear to have any evidence of infection which is good news. 11/13/17 on evaluation today patient appears to be doing excellent at this time. She is here for repeat Dermagraft application. This is #7. Overall her wound seems to be making great progress. 12/05/17; the patient arrives with the wound in much better condition than when I last saw this almost 6 weeks ago. She still has a small probing area in the left metatarsal head region on the lateral aspect of her foot. We applied her last Dermagraft today. Since the last time she is here she has what appears to a been a blood blister on the plantar aspect of left foot although I don't see this is threatening. There is also a thick raised tissue on the right mid metatarsal head region. This was not there I don't think the last time she was here 3 weeks ago. 12/12/17; the patient continues to have a small programming area in the left metatarsal head region on the lateral aspect of her foot which was the initial large surgical wound. I applied her last Apligraf  last week. I'm going to use Endoform starting today Unfortunately she has an excoriated area in the left mid foot and the right mid foot. The left midfoot looks like a blistered area this was not opened last week it certainly is open today. Using silver alginate on these areas. She promises me she is offloading this. 12/19/17; the small probing area in the left metatarsal head eyes think is shallower. In general her original wound looks better. We've been using Endoform. The area inferiorly that I think was trauma last week still requires debridement a lot of nonviable surface which I removed. She still has an open open area distally in her foot Similarly on the right foot there is tightly adherent surface debris which I removed. Still areas that don't look completely epithelialized. This is a small open area. We used silver alginate on these areas 12/26/2017; the patient did not have the supplies we ordered from last week including the Endoform. The original large wound on the left lateral foot looks healthy. She still has the undermining area that is largely unchanged from last week. She has the same heavily callused raised edged wounds on the right mid and left midfoot. Both of these requiring debridement. We have been using silver alginate on these areas  01/02/2018; there is still supply issues. We are going to try to use Prisma but I am not sure she actually got it from what she is saying. She has a new open area on the lateral aspect of the left fourth toe [previous fifth ray amputation]. Still the one tunneling area over the fourth metatarsal head. The area is in the midfoot bilaterally still have thick callus around them. She is concerned about a raised swelling on the lateral aspect of the foot. However she is completely insensate 01/10/2018; we are using Prisma to the wounds on her bilateral feet. Surprisingly the tunneling area over the left fourth metatarsal head that was part of  her original surgery has closed down. She has a small open area remaining on the incision line. 2 open areas in the midfoot. 02/10/2018; the patient arrives back in clinic after a month hiatus. She was traveling to visit family in [place]. Is fairly clear she was not offloading the areas on her feet. The original wound over the left lateral foot at the level of metatarsal heads is reopened and probes medially by about a centimeter or 2. She notes that a week ago she had purulent drainage come out of an area on the left midfoot. Paradoxically the worst area is actually on the right foot is extensive with purulent drainage. We will use silver alginate today 02/17/2018; the patient has 3 wounds one over the left lateral foot. She still has a small area over the metatarsal heads which is the remnant of her original surgical wound. This has medial probing depth of roughly 1.4 cm somewhat better than last week. The area on the right foot is larger. We have been using silver alginate to all areas. The area on the right foot and left foot that we cultured last week showed both Klebsiella and Proteus. Both of these are quinolone sensitive. The patient put her's self on Bactrim and Flagyl that she had left hanging around from prior antibiotic usages. She was apparently on this last week when she arrived. I did not realize this. Unfortunately the Bactrim will not cover either 1 of these organisms. We will send in Cipro 500 twice daily for a week 03/04/2018; the patient has 2 wounds on the left foot one is the original wound which was a surgical wound for a deep DFU. At one point this had exposed bone. She still has an area over the fourth metatarsal head that probes about 1.4 cm although I think this is better than last week. I been using silver nitrate to try and promote tissue adherence and been using silver alginate here. She also has an area in the left midfoot. This has some depth but a small linear wound.  Still requiring debridement. On the right midfoot is a circular wound. A lot of thick callus around this area. We have been using silver alginate to all wound areas She is completed the ciprofloxacin I gave her 2 weeks ago. 03/11/2018; the patient continues to have 2 open areas on the left foot 1 of which was the original surgical wound for a deep DFU. Only a small probing area remains although this is not much different from last week we have been using silver alginate. The other area is on the midfoot this is smaller linear but still with some depth. We have been using silver alginate here as well On the right foot she has a small circular wound in the mid aspect. This is not much smaller than  last time. We have been using silver alginate here as well 03/18/2018; she has 3 wounds on the left foot the original surgical wound, a very superficial wound in the mid aspect and then finally the area in the mid plantar foot. She arrives in today with a very concerning area in the wound in the mid plantar foot which is her most proximal wound. There is undermining here of roughly 1-1/2 cm superiorly. Serosanguineous drainage. She tells me she had some pain on for over the weekend that shot up her foot into her thigh and she tells me that she had a nodule in the groin area. She has the single wound in the right foot. We are using endoform to both wound areas 03/24/2018; the patient arrives with the original surgical wound in the area on the left midfoot about the same as last week. There is a collection of fluid under the surface of the skin extending from the surgical wound towards the midfoot although it does not reach the midfoot wound. The area on the right foot is about the same. Cultures from last week of the left midfoot wound showed abundant Klebsiella abundant Enterococcus faecalis and moderate methicillin resistant staph I gave her Levaquin but this would have only covered the Klebsiella. She  will need linezolid 04/01/2018; she is taking linezolid but for the first few days only took 1 a day. I have advised her to finish this at twice daily dosing. In any case all of her wounds are a lot better especially on the left foot. The original surgical wound is closed. The area on the left midfoot considerably smaller. The area on the right foot also smaller. 04/08/2018; her original surgical wound/osteomyelitis on the left foot remains closed. She has area on the left foot that is in the midfoot area but she had some streaking towards this. This is not connected with her original wound at least not visually. Small wound on the right midfoot appears somewhat smaller. 04/15/18; both wounds looks better. Original wound is better left midfoot. Using silver alginate 1/21; patient states she uses saltwater soak in, stones or remove callus from around her wounds. She is also concerned about a blood blister she had on the left foot but it simply resolved on its own. We've been using silver alginate 1/28; the patient arrives today with the same streaking area from her metatarsals laterally [the site of her original surgical wound] down to the middle of her foot. There is some drainage in the subcutaneous area here. This concerns me that there is actually continued ongoing infection in the metatarsals probably the fourth and third. This fixates an MRI of the foot without contrast [chronic renal failure] The wound in the mid part of the foot is small but I wonder whether this area actually connects with the more distal foot. The area on the right midfoot is probably about the same. Callus thick skin around the small wound which I removed with a curette we have been using silver alginate on both wound areas 2/4; culture I did of the draining site on the left foot last time grew methicillin sensitive staph aureus. MRI of the left foot showed interval resolution of the findings surrounding the third metatarsal  joint on the prior study consistent with treated osteomyelitis. Chronic soft tissue ulceration in the plantar and lateral aspect of the forefoot without residual focal fluid collection. No evidence of recurrent osteomyelitis. Noted to have the previous amputation of the distal first phalanx and  fifth ray MRI of the right foot showed no evidence of osteomyelitis I am going to treat the patient with a prolonged course of antibiotics directed against MSSA in the left foot 2/11; patient continues on cephalexin. She tells me she had nausea and vomiting over the weekend and missed 2 days. In general her foot looks much the same. She has a small open area just below the left fourth metatarsal head. A linear area in the left midfoot. Some discoloration extending from the inferior part of this into the left lateral foot although this appears to be superficial. She has a small area on the right midfoot which generally looks smaller after debridement 2/18; the patient is completing his cephalexin and has another 2 days. She continues to have open areas on the left and right foot. 2/25; she is now off antibiotics. The area on the left foot at the site of her original surgical wound has closed yet again. She still has open areas in the mid part of her foot however these appear smaller. The area on the right mid foot looks about the same. We have been using silver alginate She tells me she had a serious hypoglycemic spell at home. She had to have EMS called and get IV dextrose 3/3; disappointing on the left lateral foot large area of necrotic tissue surrounding the linear area. This appears to track up towards the same original surgical wound. Required extensive debridement. The area on the right plantar foot is not a lot better also using silver 3/12; the culture I did last time showed abundant enterococcus. I have prescribed Augmentin, should cover any unrecognized anaerobes as well. In addition there were a few  MRSA and Serratia that would not be well covered although I did not want to give her multiple antibiotics. She comes in today with a new wound in the right midfoot this is not connected with the original wound over her MTP a lot of thick callus tissue around both wounds but once again she said she is not walking on these areas 3/17-Patient comes in for follow-up on the bilateral plantar wounds, the right midfoot and the left plantar wound. Both these are heavily callused surrounding the wounds. We are continuing to use silver alginate, she is compliant with offloading and states she uses a wheelchair fairly often at home 3/24; both wound areas have thick callus. However things actually look quite a bit better here for the majority of her left foot and the right foot. 3/31; patient continues to have thick callused somewhat irritated looking tissue around the wounds which individually are fairly superficial. There is no evidence of surrounding infection. We have been using silver alginate however I change that to [facility] today 4/17; patient returns to clinic after having a scare with Covid she tested negative in her primary doctor's office. She has been using Hydrofera Blue. She does not have an open area on the right foot. On the left foot she has a small open area with the mid area not completely viable. She showed me pictures of what looks like a hemorrhagic blister from several days ago but that seems to have healed over this was on the lateral left foot 4/21; patient comes in to clinic with both her wounds on her feet closed. However over the weekend she started having pain in her right foot and leg up into the thigh. She felt as though she was running a low-grade fever but did not take her temperature. She took  a doxycycline that she had leftover and yesterday a single Septra and metronidazole. She thinks things feel somewhat better. 4/28; duplex ultrasound I ordered last week was negative  for DVT or superficial thrombophlebitis. She is completed the doxycycline I gave her. States she is still having a lot of pain in the right calf and right ankle which is no better than last week. She cannot sleep. She also states she has a temperature of up to 101, coughing and complaining of visual loss in her bilateral eyes. Apparently she was tested for Covid 2 weeks ago at [facility] and that was negative. Readmission: 09/03/18 patient presents back for reevaluation after having been evaluated at the end of April regarding erythema and swelling of her right lower extremity. Subsequently she ended up going to the hospital on 07/29/18 and was admitted not to be discharged until 08/08/18. Unfortunately it was noted during the time that she was in the hospital that she did have methicillin-resistant Staphylococcus aureus as the infection noted at the site. It was also determined that she did have osteomyelitis which appears to be fairly significant. She was treated with vancomycin and in fact is still on IV vancomycin at dialysis currently. This is actually slated to continue until 09/12/18 at least which will be the completion of the six weeks of therapy. Nonetheless based on what I'm seeing at this point I'm not sure she will be anywhere near ready to discontinue antibiotics at that time. Since she was released from the hospital she was seen by [name] who is her podiatrist on 08/27/18. His note specifically states that he is recommended that the patient needs of one knee amputation on the right as she has a life- threatening situation that can lead quickly to sepsis. The patient advised she would like to try to save her leg to which [name] apparently told her that this was against all medical advice. She also want to discontinue the Wound VAC which had been initiated due to the fact that she wasn't pleased with how the wound was looking and subsequently she wanted to pursue applying Medihoney at that time.  He stated that he did not believe that the right lower extremity was salvageable and that the patient understood but would still like to attempt hyperbaric option therapy if it could be of any benefit. She was therefore referred back to [place] for further evaluation. He plans to see her back next week. Upon inspection today patient has a significant amount purulent drainage noted from the wound at this point. The bone in the distal portion of her foot also appears to be extremely necrotic and spongy. When I push down on the bone it bubbles and seeps purulent drainage from deeper in the end of the foot. I do not think that this is likely going to heal very well at all and less aggressive surgical debridement were undertaken more than what I believe we can likely do here in our office. 09/12/2018; I have not seen this patient since the most recent hospitalization although she was in our clinic last week. I have reviewed some of her records from a complex hospitalization. 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 There is situation was complicated by MRSA bacteremia and acute on chronic renal failure now on dialysis. She is receiving vancomycin at dialysis. We started her on Dakin's wet-to-dry last week she is changing this daily. There is still purulent drainage coming out of her  foot. Although she is apparently "agreeable" to a below-knee amputation which is been suggested by multiple clinicians she wants this to be done in [place]. She apparently has a telehealth visit with that provider sometime in late [place] 6/24. I have told her I think this is probably too long. Nevertheless I could not convince her to allow a local doctor to perform BKA. 09/19/2018; the patient has a large necrotic area on the right anterior foot. She has had previous transmetatarsal amputations. Culture I did last week showed MRSA nothing else she is on vancomycin at dialysis. She  has continued leaking purulent drainage out of the distal part of the large circular wound on the right anterior foot. She apparently went to see Dr. 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 Somehow that translated into her being referred to plastic surgery for debridement of the area. I gather she basically refused amputation although I do not have a copy of Dr. Berenice Primas notes. The patient really wants to have a trial of hyperbaric oxygen. I agreed with initial assessment in this clinic that this was probably too far along to benefit however if she is going to have plastic surgery I think she would benefit from ancillary hyperbaric oxygen. The issue here is that the patient has benefited as maximally as any patient I have ever seen from hyperbaric oxygen therapy. Most recently she had exposed bone on the lateral part of her left foot after a surgical procedure and that actually has closed. She has eschared areas in both heels but no open area. She is remained systemically well. I am not optimistic that anything can be done about this but the patient is very clear that she wants an attempt. The attempt would include a wound VAC further debridements and hyperbaric oxygen along with IV antibiotics. 6/26; I put her in for a trial of hyperbaric oxygen only because of the dramatic response she has had with wounds on her left midfoot earlier this year which was a surgical wound that went straight to her bone over the metatarsal heads and also remotely the left third toe. We will see if we can get this through our review process and insurance. She arrives in clinic with again purulent material pouring out of necrotic bone on the top of the foot distally. There is also some concerning erythema on the front of the leg that we marked. It is bit difficult to tell how tender this is because of neuropathy. I note from infectious disease that she had her vancomycin extended. All the  cultures of these areas have shown MRSA sensitive to vancomycin. She had the wound VAC on for part of the week. The rest of the time she is putting various things on this including Medihoney, "ionized water" silver sorb gel etc. 7/7; follow-up along with HBO. She is still on vancomycin at dialysis. She has a large open area on the dorsal right foot and a small dark eschar area on her heel. There is a lot less erythema in the area and a lot less tenderness. From an infection point of view I think this is better. She still has a lot of necrosis in the remaining right forefoot [previous TMA] we are still using the wound VAC in this area 7/16; follow-up along with HBO. I put her on linezolid after she finished her vancomycin. We started this last Friday I gave her 2 weeks worth. I had the expectation that she would be operatively debrided by Dr.  Dillingham but that still has not happened yet. Patient phoned the office this week. She arrives for review today after HBO. The distal part of this wound is completely necrotic. Nonviable pieces of tendon bone was still purulent drainage. Also concerning that she has black eschar over the heel that is expanding. I think this may be indicative of infection in this area as well. She has less erythema and warmth in the ankle and calf but still an abnormal exam 7/21 follow-up along with HBO. I will renew her linezolid after checking a CBC with differential monitoring her blood counts especially her platelets. She was supposed to have surgery yesterday but if I am reading things correctly this was canceled after her blood sugar was found to be over 500. I thought Dr. Marla Roe who called me said that they were sending her to the ER but the patient states that was not the case. 7/28. Follow-up along with HBO. She is on linezolid I still do not have any lab work from dialysis even though I called last week. The patient is concerned about an area on her left lateral  foot about the level of the base of her fifth metatarsal. I did not really see anything that ominous here however this patient is in [place] ability to point out problems that she is sensing and she has been accurate in the past Finally she received a call from Dr. Marla Roe who is referring her to another orthopedic surgeon stating that she is too booked up to take her to the operating room now. Was still using a wound VAC on the foot 8/3 -Follow-up after HBO, she is got another week of linezolid, she is to call ID for an appointment, x-rays of both feet were reviewed, the left foot x-ray with third MTP joint osteo- Right foot x-ray widespread osteo-in the right midfoot Right ankle x-ray does not show any active evidence of infection 8/11-Patient is seen after HBO, the wounds on the right foot appear to be about the same, the heel wound had some necrotic base over tendon that was debrided with a curette 8/21; patient is seen after HBO. The patient's wound on her dorsal foot actually looks reasonably good and there is substantial amount of epithelialization however the open area distally still has a lot of necrotic debris partially bone. I cannot really get a good sense of just how deep this probes under the foot. She has been pressuring me this week to order medical maggots through a company in [place] for her. The problem I have is there is not a defined wound area here. On the positive side there is no purulence. She has been to see infectious disease she is still on Septra DS although I have not had a chance to review their notes 8/28; patient is seen in conjunction with HBO. The wounds on her foot continued to improve including the right dorsal foot substantially the, the distal part of this wound and the area on the right heel. We have been using a wound VAC over this chronically. She is still on trimethoprim as directed by infectious disease 9/4; patient is seen in conjunction with HBO.  Right dorsal foot wound substantially anteriorly is better however she continues to have a deep wound in the distal part of this that is not responding. We have been using silver collagen under border foam Area on the right plantar medial heel seems better. We have been using Hydrofera Blue 12/12/18 on evaluation today patient appears to  be doing about the same with regard to her wound based on prior measurements. She does have some necrotic tissue noted on the lateral aspect of the wound that is going require a little bit of sharp debridement today. This includes what appears to be potentially either severely necrotic bone or tendon. Nonetheless other than that she does not appear to have any severe infection which is good news 9/18; it is been 2 weeks since I saw this wound. She is tolerating HBO well. Continued dramatic improvement in the area on the right dorsal foot. She still has a small wound on the heel that we have been using Hydrofera Blue. She continues with a wound VAC 9/24; patient has to be seen emergently today with a swelling on her right lateral lower leg. She says that she told Dr. Evette Doffing about this and also myself on a couple of occasions but I really have no recollection of this. She is not systemically unwell and her wound really looked good the last time I saw this. She showed this to providers at dialysis and she was able to verify that she was started on cephalexin today for 5 doses at dialysis. She dialyzes on Tuesday Thursday and Saturday. 10/2; patient is seen in conjunction with HBO. The area that is draining on the right anterior medial tibia is more extensive. Copious amounts of serosanguineous drainage with some purulence. We are still using the wound VAC on the original wound then it is stable. Culture I did of the original IandD showed MRSA I contacted dialysis she is now on vancomycin with dialysis treatments. I asked them to run a month 10/9; patient seen in  conjunction with HBO. She had a new spontaneous open area just above the wound on the right medial tibia ankle. More swelling on the right medial tibia. Her wound on the foot looks about the same perhaps slightly better. There is no warmth spreading up her leg but no obvious erythema. her MRI of the foot and ankle and distal tib-fib is not booked for next Friday I discussed this with her in great detail over multiple days. it is likely she has spreading infection upper leg at least involving the distal 25% above the ankle. She knows that if I refer her to orthopedics for infectious disease they are going to recommend amputation and indeed I am not against this myself. We had a good trial at trying to heal the foot which is what she wanted along with antibiotics debridement and HBO however she clearly has spreading infection [probably staph aureus/MRSA]. Nevertheless she once again tells me she wants to wait the left of the MRI. She still makes comments about having her amputation done in [place]. 10/19; arrives today with significant swelling on the lateral right leg. Last culture I did showed Klebsiella. Multidrug-resistant. Cipro was intermediate sensitivity and that is what I have her on pending her MRI which apparently is going to be done on Thursday this week although this seems to be moving back and forth. She is not systemically unwell. We are using silver alginate on her major wound area on the right medial foot and the draining areas on the right lateral lower leg 10/26; MRI showed extensive abscess in the anterior compartment of the right leg also widespread osteomyelitis involving osseous structures of the midfoot and portions of the hindfoot. Also suspicion for osteomyelitis anterior aspect of the distal medial malleolus. Culture I did of the purulence once again showed a multidrug-resistant Klebsiella. I have  been in contact with nephrology late last week and she has been started on  cefepime at dialysis to replace the vancomycin We sent a copy of her MRI report to Dr. Geroge Baseman in [place] who is an orthopedic surgeon. The patient takes great stock in his opinion on this. She says she will go to [place] to have her leg amputated if Dr. Geroge Baseman does not feel there is any salvage options. 11/2; she still is not talk to her orthopedic surgeon in [place]. Apparently he will call her at [phone] this afternoon. The quality of this is she has not allowed me to refer her anywhere. She has been told over and over that she needs this amputated but has not agreed to be referred. She tells me her blood sugar was 600 last night but she has not been febrile. 11/9; she never did got a call from the orthopedic surgeon in [place] therefore that is off the radar. We have arranged to get her see orthopedic surgery at [facility]. She still has a lot of draining purulence coming out of the new abscess in her right leg although that probably came from the osteomyelitis in her right foot and heel. Meanwhile the original wound on the right foot looks very healthy. Continued improvement. The issue is that the last MRI showed osteomyelitis in her right foot extensively she now has an abscess in the right anterior lower leg. There is nobody in [place] who will offer this woman anything but an amputation and to be honest that is probably what she needs. I think she still wants to talk about limb salvage although at this point I just do not see that. She has completed her vancomycin at dialysis which was for the original staph aureus she is still on cefepime for the more recent Klebsiella. She has had a long course of both of these antibiotics which should have benefited the osteomyelitis on the right foot as well as the abscess. 11/16; apparently [place] elective surgery is shut down because of COVID-19 pandemic. I have reached out to some contacts at [facility] to see if we can get her an  orthopedic appointment there. I am concerned about continually leaving this but for the moment everything is static. In fact her original large wound on this foot is closing down. It is the abscess on the right anterior leg that continues to drain purulent serosanguineous material. She is not currently on any antibiotics however she had a prolonged course of vancomycin [1 month] as well as cefepime for a month 02/24/2019 on evaluation today patient appears to be doing better than the last time I saw her. This is not a patient that I typically see. With that being said I am covering for Dr. Dellia Nims this week and again compared to when I last saw her overall the wounds in particular seem to be doing significantly better which is good news. With that being said the patient tells me several disconcerting things. She has not been able to get in to see anyone for potential debridement in regard to her leg wounds although she tells me that she does not think it is necessary any longer because she is taking care of that herself. She noticed a string coming out of the lower wound on her leg over the last week. The patient states that she subsequently decided that we must of pack something in there and started pulling the string out and as it kept coming and coming she realized this was likely  her tendon. With that being said she continued to remove as much of this as she could. She then I subsequently proceeded to using tubes of antibiotic ointment which she will stick down into the wound and then scored as much as she can until she sees it coming out of the other wound opening. She states that in doing this she is actually made things better and there is less redness and irritation. With regard to her foot wound she does have some necrotic tendon and tissue noted in one small corner but again the actual wound itself seems to be doing better with good granulation in general compared to my last evaluation. 12/7;  continued improvement in the wound on the substantial part of the right medial foot. Still a necrotic area inferiorly that required debridement but the rest of this looks very healthy and is contracting. She has 2 wounds on the right lateral leg which were her original drainage sites from her abscess but all of this looks a lot better as well. She has been using silver alginate after putting antibiotic biotic ointment in one wound and watching it come out the other. I have talked to her in some detail today. I had given her names of orthopedic surgeons at [facility] for second opinion on what to do about the right leg. I do not think the patient never called them. She has not been able to get a hold of the orthopedic surgeon in [place] that she had put a lot of faith in as being somebody would give her an opinion that she would trust. I talked to her today and said even if I could get her in to another orthopedic surgeon about the leg which she accept an amputation and she said she would not therefore I am not going to press this issue for the moment 12/14; continued improvement in his substantial wound on the right medial foot. There is still a necrotic area inferiorly with tightly adherent necrotic debris which I have been working on debriding each time she is here. She does not have an orthopedic appointment. Since last time she was here I looked over her cultures which were essentially MRSA on the foot wound and gram-negative rods in the abscess on the anterior leg. 12/21; continued improvement in the area on the right medial foot. She is not up on this much and that is probably a good thing since I do not know it could support continuous ambulation. She has a small area on the right lateral leg which were remanence of the IandD's I did because of the abscess. I think she should probably have prophylactic antibiotics I am going to have to look this over to see if we can make an intelligent  decision here. In the meantime her major wound is come down nicely. Necrotic area inferiorly is still there but looks a lot better 04/06/2019; she has had some improvement in the overall surface area on the right medial foot somewhat narrowedr both but somewhat longer. The areas on the right lateral leg which were initial IandD sites are superficial. Nothing is present on the right heel. We are using silver alginate to the wound areas 1/18; right medial foot somewhat smaller. Still a deep probing area in the most distal recess of the wound. She has nothing open on the right leg. She has a new wound on the plantar aspect of her left fourth toe which may have come from just pulling skin. The patient using Medihoney  on the wound on her foot under silver alginate. I cannot discourage her from this 2/1; 2-week follow-up using silver alginate on the right foot and her left fourth toe. The area on the right dorsal foot is contracted although there is still the deep area in the most distal part of the wound but still has some probing depth. No overt infection 2/15; 2-week follow-up. She continues to have improvement in the surface area on the dorsal right foot. Even the tunneling area from last time is almost closed. The area that was on the plantar part of her left fourth toe over the PIP is indeed closed 3/1; 2-week follow-up. Continued improvement in surface area. The original divot that we have been debriding inferiorly I think has full epithelialization although the epithelialization is gone down into the wound with probably 4 mm of depth. Even under intense illumination I am unable to see anything open here. The remanence of the wound in this area actually look quite healthy. We have been using silver alginate 3/15; 2-week follow-up. Unfortunately not as good today. She has a comma shaped wound on the dorsal foot however the upper part of this is larger. Under illumination debris on the surface She also  tells [place] that she was on her right leg 2 times in the last couple of weeks mostly to reach up for things above her head etc. She felt a sharp pain in the right leg which she thinks is somewhere from the ankle to the knee. The patient has neuropathy and is really uncertain. She cannot feel her foot so she does not think it was coming from there 3/29; 2-week follow-up. Her wound measures smaller. Surface of the wound appears reasonable. She is using silver alginate with underlying Medihoney. She has home health. X-rays I did of her tib-fib last time were negative although it did show arterial calcification 4/12; 2-week follow-up. Her wound measures smaller in length. Using manuka honey with silver alginate on top. She has home health. 4/26; 2-week follow-up. Her wound is smaller but still very adherent debris under illumination requiring debridement she has been using manuka honey with silver alginate. 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 Been using Hydrofera Blue. She is on suppressive Bactrim but apparently she has been taking it twice daily 6/7; I have not seen this wound and about 6 weeks. Since then she was up in [place]. By her own admission she was walking on the foot because she did not have a wheelchair. The wound is not nearly as healthy looking as it was the last time I saw this. We ordered different things for her but she only uses Medihoney and silver alginate. As far as I know she is on suppressive trimethoprim sulfamethoxazole. She does not admit to any fever or chills. Her CBGs apparently are at baseline however she is saying that she feels some discomfort on the lateral part of her ankle I looked over her last inflammatory markers from the summer 2020 at which time she had a deeply necrotic infected wound in this area. On 11/10/2018 her sedimentation rate was 56 and C-reactive  protein 9.9. This was 107 and 29 on 07/29/2018. 6/17; the patient had a necrotic wound the last time she was here on the right dorsal foot. After debridement I did a culture. This showed a very resistant ESBL Klebsiella as well as Enterococcus.  Her x-ray of the foot which was done because of warmth and some discomfort showed bone destruction within the carpal bones involving the navicular acute cuboid lateral middle cuneiforms but essentially unchanged from her prior study which was done on 10/29/2018. The findings were felt to represent chronic osteomyelitis. We did inflammatory markers on her. Her white count was 5.25 sedimentation rate 16 and C-reactive protein at 11.1. Notable for the fact that in August 2020 her CRP was 9.9 and sedimentation rate 56. I have looked at her x-rays. It is true that the bone destruction is very impressive however the patient came into this clinic for the wound on her right foot with pieces of bone literally falling out anteriorly with purulent material. I am not exactly sure I could have expected anything different. She has not been systemically unwell no fever chills or blood sugars have been reasonable. 6/28; she arrives with a right heel closed. The substantial area on the right anterior foot looks healthy. Much better looking surface. I think we can change to [facility] seems to help this previously. She is getting her antibiotics at dialysis she should be just about finished 7/9; changed to [facility] last week. Surface wound looks satisfactory not much change in surface area however. She is going to [place] state next week this is usually a difficult thing for this patient follow-up will be for 2 weeks. 7/23; using Hydrofera Blue. She returns from her trip and the wound looks surprisingly good. Usually when this patient goes on trips she comes back with a lot of problems with the wound. She is saying that she sometimes feels an episodic "crunching"  feeling on the lateral part of the foot. She is neuropathic and not feeling pain but wonders whether this could be a neuropathic dysesthesia. 11/13/19-Patient returns after 3 weeks, the wound itself is stable and patient states that there is nothing new going on she is on some extra anxiety medications and is resisting the temptation to pick at the dry skin around the wound. 9/20; patient has not been here in over a month and I have not seen her in 2 months. The wound in terms of size I think is about the same. There is no exposed bone. She has a nonviable surface on this. She is supposed to be using [facility] however she is also been using some form of honey preparation as well as a silver-based dressings. I do not think she has any pattern to this. 10/4; 2-week follow-up. Patient has been using some form of spray which she says has honey and silver to purchase this online she has been covering it with gauze. In spite of this the wound actually looks quite good. The deeper divot distally appears to be close down. There is a rim of epithelialization. 10/18; 2-week follow-up. Patient has been using her Hydrofera Blue covered with her silver honey spray that she got online. 11/1; 2-week follow-up. She is using Hydrofera Blue with a silver honey spray. Wound bed is measuring smaller. She has noticed that her foot is warmer on the right. She is concerned about infection. For a long period of time I had her on prophylactic trimethoprim sulfamethoxazole DS 1 tablet daily. She is asking for this to be restarted. The patient is walking on this foot because of repairs that are being done in a home her but her room is on the second floor she has to go up and down stairs. I have cautioned against this however  as usual she will do exactly what she wants to do 11/15; 2-week follow-up. She uses Hydrofera Blue with a silver/honey spray which I have never heard of. I think her wound looks about the same.  Some epithelialization. No evidence that this is infected. I think she is walking on this more than we agreed on. She is going on extensive vacation over Thanksgiving 12/3; 2-week follow-up. She is using Hydrofera Blue however over Thanksgiving she ran out of this and she is simply been using Medihoney. In spite of this her wound is smaller almost divided into 2 now. She traveled extensively over Thanksgiving and actually looks quite good in spite of this. Usually this is been a marker of problems for her 12/17; 2-week follow-up. She is using Hydrofera Blue. The wound is smaller. Debris on the surface of this is fibrinous. She is traveling to [place] over the holidays which never bodes well for her wounds. I think she is walking more on her feet then she is even willing to admit and she tells me she does walk 2/11; using Hydrofera Blue. Her wounds are contracting however she walks in the clinic with a history that she has not been able to eat she has had vomiting. She also has right eye problems. Her blood sugar was 567. She had blood work done at dialysis and we called there to get her blood work although it still had not returned although they should have this by the end of the day we were told. She also stated that she was not sure what her blood sugar was as her glucose monitor was not working and not coming to tomorrow. She also for some reason does not think her insulin pump is working well. Her endocrinologist is Dr. Elayne Snare Electronic Signature(s) Signed: 05/13/2020 5:36:32 PM By: Linton Ham MD Entered By: Linton Ham on 05/13/2020 12:38:31 -------------------------------------------------------------------------------- Physical Exam Details Patient Name: Date of Service: [name] 05/13/2020 9:00 A M Medical Record Number: [id number] Patient Account Number: [id number] Date of Birth/Sex: Treating RN: 09/10/1970 (50 y.o. [name] Primary Care Provider:  [name], [name] Other Clinician: Referring Provider: Treating Provider/Extender: [name], [name] Weeks in Treatment: 21 Constitutional Patient is hypertensive.. Pulse regular and within target range for patient.Marland Kitchen Respirations regular, non-labored and within target range.. Temperature is normal and within the target range for the patient.Marland Kitchen Appears in no distress. Gastrointestinal (GI) Abdomen is soft and non-distended without masses or tenderness.. No liver or spleen enlargement. 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 Notes Wound exam; her wounds actually look quite good. Very minimal remaining area. No debridement was necessary. She is awake alert and conversational Electronic Signature(s) Signed: 05/13/2020 5:36:32 PM By: Linton Ham MD Entered By: Linton Ham on 05/13/2020 12:43:09 -------------------------------------------------------------------------------- Physician Orders Details Patient Name: Date of Service: [name] 05/13/2020 9:00 [place] Record Number: [id number] Patient Account Number: [id number] Date of Birth/Sex: Treating RN: Jan 01, 1971 (50 y.o. [name] Primary Care Provider: [name], [name] Other Clinician: Referring Provider: Treating Provider/Extender: [name], [name] Weeks in Treatment: 50 Verbal / Phone Orders: No Diagnosis Coding ICD-10 Coding Code Description M86.671 Other chronic osteomyelitis, right ankle and foot L97.514 Non-pressure chronic ulcer of other part of right foot with necrosis of bone L97.411 Non-pressure chronic ulcer  of right heel and midfoot limited to breakdown of skin E10.621 Type 1 diabetes mellitus with foot ulcer Follow-up Appointments Return appointment in 3 weeks. Bathing/ Shower/ Hygiene May shower and wash wound with soap  and water. Edema Control - Lymphedema / SCD / Other Bilateral Lower Extremities Elevate legs to the level of the heart or above for 30 minutes daily and/or when sitting, a frequency of: - throughout the day Avoid standing for long periods of time. Moisturize legs daily. - with dressing changes Off-Loading Other: - minimal weight bearing right foot Non Wound Condition Other Non Wound Condition Orders/Instructions: - follow up with Dr. Baird Cancer for right eye palsy today Home Health No change in wound care orders this week; continue Home Health for wound care. May utilize formulary equivalent dressing for wound treatment orders unless otherwise specified. Other Home Health Orders/Instructions: - Interim Wound Treatment Wound #43 - Foot Wound Laterality: Right, Medial Cleanser: Wound Cleanser (Home Health) Every Other Day/30 Days Discharge Instructions: Cleanse the wound with wound cleanser or normal saline prior to applying a clean dressing using gauze sponges, not tissue or cotton balls. Prim Dressing: MediHoney Calcium Alginate Dressing 4x5 in Every Other Day/30 Days ary Discharge Instructions: Apply to wound bed as instructed Secondary Dressing: Woven Gauze Sponge, Non-Sterile 4x4 in [facility]) Every Other Day/30 Days Discharge Instructions: Apply over primary dressing as directed. Secured With: Elastic Bandage 4 inch (ACE bandage) (Home Health) Every Other Day/30 Days Discharge Instructions: Secure with ACE bandage as directed. Secured With: [employer], 4.5x3.1 (in/yd) [facility]) Every Other Day/30 Days Discharge Instructions: Secure with Kerlix as directed. Secured With: Paper Tape, 2x10 (in/yd) (Home Health) Every Other Day/30 Days Discharge Instructions: Secure dressing with tape as directed. Radiology ngiogram (MRA brain - headache, 3rd cranial nerve palsy CPT Magnetic Resonance A ) Electronic Signature(s) Signed: 05/13/2020 5:36:32 PM By: Linton Ham  MD Signed: 05/13/2020 5:49:38 PM By: Baruch Gouty RN, BSN Entered By: Baruch Gouty on 05/13/2020 15:08:29 Prescription 05/13/2020 -------------------------------------------------------------------------------- Margorie John L. Linton Ham MD Patient Name: Provider: 1970/06/16 [id number] Date of Birth: NPI#: [id number] Sex: DEA #: [phone] [id number] Phone #: License #: [place] Patient Address: [address] [place], [place] [address] [place],  [address] [phone] Allergies Cleocin; lisinopril Provider's Orders ngiogram (MRA brain - headache, 3rd cranial nerve palsy CPT Magnetic Resonance A ) Hand Signature: Date(s): Electronic Signature(s) Signed: 05/13/2020 5:36:32 PM By: Linton Ham MD Signed: 05/13/2020 5:49:38 PM By: Baruch Gouty RN, BSN Entered By: Baruch Gouty on 05/13/2020 15:08:31 -------------------------------------------------------------------------------- Problem List Details Patient Name: Date of Service: [name] 05/13/2020 9:00 A M Medical Record Number: [id number] Patient Account Number: [id number] Date of Birth/Sex: Treating RN: 1971/03/08 (50 y.o. [name], [name] Primary Care Provider: [name], [name] Other Clinician: Referring Provider: Treating Provider/Extender: [name], [name] Weeks in Treatment: 8 Active Problems ICD-10 Encounter Code Description Active Date MDM Diagnosis M86.671 Other chronic osteomyelitis, right ankle and foot 09/03/2018 No Yes L97.514 Non-pressure chronic ulcer of other part of right foot with necrosis of bone 09/03/2018 No Yes L97.411 Non-pressure chronic ulcer of right heel and midfoot limited to breakdown of 09/17/2019 No Yes skin E10.621 Type 1 diabetes mellitus with foot ulcer 09/24/2018 No Yes 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, right eye 05/13/2020 No Yes Inactive  Problems ICD-10 Code Description Active Date Inactive Date L97.521 Non-pressure chronic ulcer of  other part of left foot limited to breakdown of skin 04/20/2019 04/20/2019 L97.812 Non-pressure chronic ulcer of other part of right lower leg with fat layer exposed 02/24/2019 02/24/2019 Resolved Problems ICD-10 Code Description Active Date Resolved Date L02.415 Cutaneous abscess of right lower limb 12/25/2018 12/25/2018 Electronic Signature(s) Signed: 05/13/2020 5:36:32 PM By: Linton Ham MD Signed: 05/13/2020 5:49:38 PM By: Baruch Gouty RN, BSN Entered By: Baruch Gouty on 05/13/2020 15:10:43 -------------------------------------------------------------------------------- Progress Note Details Patient Name: Date of Service: [name] 05/13/2020 9:00 A M Medical Record Number: [id number] Patient Account Number: [id number] Date of Birth/Sex: Treating RN: 02-Dec-1970 (50 y.o. [name] Primary Care Provider: [name], [name] Other Clinician: Referring Provider: Treating Provider/Extender: [name], [name] Weeks in Treatment: 73 Subjective History of Present Illness (HPI) 50 year old diabetic who is known to have type 1 diabetes which is poorly controlled last hemoglobin A1c was 11%. She comes in with a ulcerated area on the left lateral foot which has been there for over 6 months. Was recently she has been treated by [name] of podiatry who saw her last on 05/28/2016. Review of his notes revealed that the patient had incision and drainage with placement of antibiotic beads to the left foot on 04/11/2016 for possible osteomyelitis of the cuboid bone. Over the last year she's had a history of amputation of the left fifth toe and a femoropopliteal popliteal bypass graft somewhere in April 2017. 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 His note [name] mentions that the patient has been referred to me for further wound care and  possibly great candidate for hyperbaric oxygen therapy due to recurrent osteomyelitis. However we do not have any x-rays of biopsy reports confirming this. He has been on several antibiotics including Bactrim and most recently is on doxycycline for an MRSA. I understand, the patient was not a candidate for IV antibiotics as she has had previous PICC lines which resulted in blood clots in both arms. There was a x-ray report dated 04/04/2016 on [name] notes which showed evidence of fifth ray resection left foot with osteolytic changes noted to the fourth metatarsal and cuboid bone on the left. 06/13/2016 -- had a left foot x-ray which showed no acute fracture or dislocation and no definite radiographic evidence of osteomyelitis. Advanced osteopenia was seen. 06/20/2016 -- she has noticed a new wound on the right plantar foot in the region where she had a callus before. 06/27/16- the patient did have her x-ray of the right foot which showed no findings to suggest osteomyelitis. She saw her endocrinologist, [name], yesterday. Her A1c in January was 11. He also indicates mismanagement and noncompliance regarding her diabetes. She is currently on Bactrim for a lip infection. She is complaining of nausea, vomiting and diarrhea. She is unable to articulate the exact orders or dosing of the Bactrim; it is unclear when she will complete this. 07/04/2016 -- results from [facility] of ABIs with ankle waveforms were noted from 02/14/2016. The examination done on 06/27/2015 showed noncompressible ABIs with the right being 1.45 and the left being 1.33. The present examination showed a right ABI of 1.19 on the left of 1.33. The conclusion was that right normal ABI in the lower extremity at rest however compared to previous study which was noncompressible ABI may be falsely elevated side suggesting medial calcification. The left ABI suggested medial calcification. 08/01/2016 -- the patient had more redness and  pain on her right  foot and did not get to come to see as noted she see her PCP or go to the ER and decided to take some leftover metronidazole which she had at home. As usual, the patient does report she feels and is rather noncompliant. 08/08/2016 -- -- x-ray of the right foot -- FINDINGS:Transmetatarsal amputation is noted. No bony destruction is noted to suggest osteomyelitis. IMPRESSION: No evidence of osteomyelitis. Postsurgical changes are seen. MRI would be more sensitive for possible bony changes. Culture has grown Serratia Marcescens -- sensitive to Bactrim, ciprofloxacin, ceftazidime she was seen by [name] on 08/06/2016. He did not find any exposed bone, muscle, tendon, ligament or joint. There was no malodor and he did a excisional debridement in the office. ============ Old notes: 50 year old patient who is known to the wound clinic for a while had been away from the wound clinic since 09/01/2014. Over the last several months she has been admitted to various hospitals including [facility] [place]. She was treated for a right metatarsal osteomyelitis with a transmetatarsal amputation and this was done about 2 months ago. He has a small ulcerated area on the right heel and she continues to have an ulcerated area on the left plantar aspect of the foot. The patient was recently admitted to the [facility] hospital group between 7/12 and 10/18/2014. she was given 3 weeks of IV vancomycin and was to follow-up with her surgeons at [facility] and also took oral vancomycin for C. difficile colitis. 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 MRI of the left foot done on 10/14/2014 showed no abscess or  osteomyelitis. 04/27/15; this is a patient we know from previous stays in the wound care center. She is a type I diabetic I am not sure of her control currently. Since the last time I saw her she is had a right transmetatarsal amputation and has no wounds on her right foot and has no open wounds. She is been followed at the wound care center at [facility] in [place]. She comes today with the desire to undergo hyperbaric treatment locally. Apparently one of her wound care providers in [place] has suggested hyperbarics. This is in response to an MRI from 04/18/15 that showed increased marrow signal and loss of the proximal fifth metatarsal cortex evidence of osteomyelitis with likely early osteomyelitis in the cuboid bone as well. She has a large wound over the base of the fifth metatarsal. She also has a eschar over her the tips of her toes on 1,3 and 5. She does not have peripheral pulses and apparently is going for an angiogram tomorrow which seems reasonable. After this she is going to infectious disease at [facility]. They have been using Medihoney to the large wound on the lateral aspect of the left foot to. The patient has known Charcot deformity from diabetic neuropathy. She also has known diabetic PAD. Surprisingly I can't see that she has had any recent antibiotics, the patient states the last antibiotic she had was at the end of November for 10 days. I think this was in response to culture that showed group G strep although I'm not exactly sure where the culture was from. She is also had arterial studies on 03/29/15. This showed a right ABI of 1.4 that was noncompressible. Her  left ABI was 0.73. 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 It was not felt that arterial inflow was adequate for healing of a foot ulcer. Her Doppler waveforms looked monophasic ===== READMISSION 02/28/17; this is in an now 50 year old woman we've had at several different occasions in  this clinic. 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 She has a remote ex-smoker. She was last seen in this clinic by Dr. Con Memos I think in May. More recently she is been followed by her podiatrist [name] an infectious disease Dr. Megan Salon. She has 2 open wounds the major one is over the right first metatarsal head she also has a wound on the left plantar foot. an MRI of the right foot on 01/01/17 showed a soft tissue ulcer along the plantar aspect of the first metatarsal base consistent with osteomyelitis of the first metatarsal stump. Dr. 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 According to the patient this is been open for slightly over a month. She has been on a combination of Cipro 500 twice a day, Zyvox 600 twice a day and Flagyl 500 3 times a day for over a month now as directed by Dr. Megan Salon. cultures of the right foot earlier this year showed MRSA in January and Serratia in May. January also had a few viridans strep. Recent x-rays of both feet were done and [name] office and I don't have these reports. The patient has known PAD and has a history of aleft femoropopliteal bypass in April 2017. 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 Dwyane Dee. In spite of this the last hemoglobin A1c I can see is 10.1 on 01/01/2017. She is being referred by 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 She is been using Medihoney to this area and also an area on the left midfoot. She is using healing sandals bilaterally. ABIs in this clinic at the left posterior tibial was 1.1 noncompressible on the right READMISSION Non invasive vascular NOVANT  5/18 Aftercare following surgery of the circulatory system Procedure Note - Interface, External Ris In - 08/13/2016 11:05 AM EDT Procedure: Examination consists of physiologic resting arterial pressures of the brachial and ankle arteries bilaterally with continuous wave Doppler waveform analysis. Previous: Previous exam performed on 02/14/16 demonstrated ABIs of Rt = 1.19 and Lt = 1.33. Right: ABI = non-compressible PT 1.47 DP. S/P transmet amputation. , Left: ABI = 1.52, 2nd digit pressure = 87 mmHg Conclusions: Right: ABI (>1.3) may be falsely elevated, suggesting medial calcification. Left: ABI (>1.3) may be falsely elevated, suggesting medial calcification The patient is a now 50 year old type I diabetic is had multiple issues her graded to chronic diabetic foot ulcers. 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 We had her in the clinic lastin November. At that point she had wounds on her bilateral feet.she had wanted to try hyperbarics however the healogics review process denied her because she hadn't followed up with her vascular surgeon for her left femoropopliteal bypass. The bypass was done by Dr. Raul Del at [facility]. We made her a follow-up with Dr. Raul Del however she did not keep the  appointment and therefore she was not approved The patient shows me a small wound on her left fourth metatarsal head on her phone. She developed rapid discoloration in the plantar aspect of the left foot and she was admitted to hospital from 2/2 through 05/10/17 with wet gangrene of the left foot osteomyelitis of the fourth metatarsal heads. She was admitted acutely ill with a temperature of 103. She was started on broad-spectrum vancomycin and cefepime. On 05/06/17 she was taken to the OR by [name] her podiatric surgeon for an incision and drainage irrigation of the left foot wound. Cultures from this surgery revealed  group be strep and anaerobes. she was seen by Dr.Xu of orthopedic surgery and scheduled for a below-knee amputation which she u refused. Ultimately she was discharged on Levaquin and Flagyl for one month. MRI 05/05/17 done while she was in the hospital showed abscess adjacent to the fourth metatarsal head and neck small abscess around the fourth flexor tendon. Inflammatory phlegmon and gas in the soft tissues along the lateral aspect of the fourth phalanx. Findings worrisome for osteomyelitis involving the fourth proximal and middle phalanx and also the third and fourth metatarsals. Finally the patient had actually shortly before this followed up with Dr. Raul Del at no time on 04/29/17. He felt that her left femoropopliteal bypass was patent he felt that her left-sided toe pressures more than adequate for healing a wound on the left foot. This was before her acute presentation. 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 05/28/17; she is started hyperbarics. The patient tells me that for some reason she was not actually on Levaquin but I think on ciprofloxacin. She was on Flagyl. She only started her Levaquin yesterday due to some difficulty with the pharmacy and perhaps her sister picking it up. She has an appointment with [name] tomorrow and with infectious disease early next week. She has no new complaints 06/06/17; the patient continues in hyperbarics. She saw [name] on 05/29/17 who is her podiatric surgeon. He is elected for a transmetatarsal amputation on 06/27/17. I'm not sure at what level he plans to do this amputation. The patient is unaware ooShe also saw Dr. Megan Salon of infectious disease who elected to continue her on current antibiotics I think this is ciprofloxacin and Flagyl. I'll need to clarify with her tomorrow if she actually has this. We're using silver alginate to the actual wound. Necrotic surface today with material under the flap of her foot. ooOriginal MRI showed abscesses  as well as osteomyelitis of the proximal and middle fourth phalanx and the third and fourth metatarsal heads 06/11/17; patient continues in hyperbarics and continues on oral antibiotics. She is doing well. The wound looks better. The necrotic part of this under the flap in her superior foot also looks better. she is been to see [name]. I haven't had a chance to look at his note. Apparently he has put the transmetatarsal amputation on hold her request it is still planning to take her to the OR for debridement and product application ACEL. I'll see if I can find his note. I'll therefore leave product ordering/requests to [name] for now. I was going to look at Dermagraft 06/18/17-she is here in follow-up evaluation for bilateral foot wounds. She continues with hyperbaric therapy. She states she has been applying manuka honey to the right plantar foot and alternate manuka honey and silver alginate to the left foot, despite our orders. We will continue with same treatment plan and she will follo  up next week. 06/25/17; I have reviewed [name] last note from 3/11. She has operative debridement in 2 days' time. By review his note apparently they're going to place there is skin over the majority of this wound which is a good choice. She has a small satellite area at the most proximal part of this wound on the left plantar foot. The area on the right plantar foot we've been using silver alginate and it is close to healing. 07/02/17; unfortunately the patient was not easily approved for [name] proposed surgery. I'm not completely certain what the issue is. She has been using silver alginate to the wound she has completed a first course of hyperbarics. She is still on Levaquin and Flagyl. I have really lost track of the time course here.I suspect she should have another week to 2 of antibiotics. I'll need to see if she is followed up with infectious disease Dr. Megan Salon 07/09/17; the patient is followed up  with Dr. Megan Salon. 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 She continues on Levaquin and metronidazole continuing both of these for now I think she is been on fr about 6 weeks. She still has some drainage but no pain. No fever. Her had been plans for her to go to the OR for operative debridement with her podiatrist [name], I am not exactly sure where that is. I'll probably slip a note to [name] today. I note that she follows with Dr. Dwyane Dee of endocrinology. We have her recertified for hyperbaric oxygen. I have not heard about Dermagraft however I'll see if [name] is planning a skin substitute as well 07/16/17; the patient tells me she is just about out of [place]. I'll need to check Dr. Hale Bogus last notes on this. She states she has plenty of Flagyl however. She comes in today complaining of pain in the right lateral foot which she said lasted for about a day. The wound on the right foot is actually much more medially. She also tells me that the [facility] cost a lot of pain in the left foot wound and she turned back to silver alginate. Finally Dermagraft has a $465 per application co-pay. She cannot afford this 07/23/17; patient arrives today with the wound not much smaller. There is not much new to add. She has not heard from [name] all try to put in a call to them today. She was asking about Dermagraft again and she has an over $681 per application co-pay she states that she would be willing to try to do a payment plan. I been tried to avoid this. We've been using silver alginate, I'll change to [facility] 07/30/17-She is here in follow-up evaluation for left foot ulcer. She continues hyperbaric medicine. The left foot ulcer is stable we will continue with same treatment plan 08/06/17; she is here for evaluation of her left foot ulcer. Currently being treated for hyperbarics or underlying osteomyelitis. She is completed antibiotics.  The left foot ulcer is better smaller with healthier looking granulation. For various reasons I am not really clear on we never got her back to the OR with [name]. He did not respond to my secure text message. Nevertheless I think that surgery on this point is not necessary nor am I completely clear that a skin substitute is necessary The patient is complaining about pain on the outside of her right foot. 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  There is no erythema. She also states the foot is warm versus her other part of her upper leg and this is largely true. It is not totally clear to me what's causing this. She thinks it's different from her usual neuropathy pain 08/13/17; she arrives in clinic today with a small wound which is superficial on her right first metatarsal head. 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 She tells [place] she was up on her feet over the Mother's Day celebration. ooThe large wound is on the left foot. Continues with hyperbarics for underlying osteomyelitis. We're using Hydrofera Blue. She asked me today about where we were with Dermagraft. I had actually excluded this because of the co-pay however she wants to assume this therefore I'll recheck the co-pay an order for next week. 08/20/17; the patient agreed to accept the co-pay of the first Apligraf which we applied today. She is disappointed she is finishing hyperbarics will run this through the insurance on the extent of the foot infection and the extent of the wound that she had however she is already had 60 dive's. Dermagraft No. 1 08/27/17; Dermagraft No. 2. She is not eligible for any more hyperbaric treatments this month. She reports a fair amount of drainage and she actually changed to the external dressings without disturbing the direct contact layer 09/03/17; the patient arrived in clinic today with the wound superficially looking quite healthy. Nice vibrant red tissue with some  advancing epithelialization although not as much adherence of the flap as I might like. However she noted on her own fourth toe some bogginess and she brought that to our attention. Indeed this was boggy feeling like a possibility of subcutaneous fluid. 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 She is not been unwell. We've been using Dermagraft 09/10/17; the culture that I did not last week was MRSA. She saw Dr. Megan Salon this morning who is going to start her on vancomycin. I had sent him a secure a text message yesterday. I also spoke with her podiatric surgeon [name] about surgery on this foot the options for conserving a functional foot etc. Promised me he would see her and will make back consultation today. Paradoxically her actual wound on the plantar aspect of her left foot looks really quite good. I had given her 5 days worth of Baxdella to cover her for MRSA. Her MRI came back showing osteomyelitis within the third metatarsal shaft and head and base of the third and fourth proximal phalanx. She had extensive inflammatory changes throughout the soft tissue of the lateral forefoot. With an ill-defined fluid around the fourth metatarsal extending into the plantar and dorsal soft tissues 09/19/17; the patient is actually on oral Septra and Flagyl. She apparently refused IV vancomycin. She also saw [name] at my request who is planning her for a left BKA sometime in mid July. MRI showed osteomyelitis within the third metatarsal shaft and head and the basis of the third and fourth proximal phalanx. I believe there was felt to be possible septic arthritis involving the third MTP. 09/26/17; the patient went back to Dr. Megan Salon at my suggestion and is now receiving IV daptomycin. Her wound continues to look quite good making the decision to proceed with a transmetatarsal amputation although more difficult for the patient. I  believe in my extensive discussions with her she has a good sense of the pros and  cons of this. I don't NV the tuft decision she has to make. She has an appointment with [name] I believe in mid July and I previously spoken to him about this issue Has we had used 3 previous Dermagraft. Given the condition of the wound surface I went ahead and added the fourth one today area and I did this not fully realizing that she'll be traveling to [place] next week. I'm hopeful she can come back in 2 weeks 10/21/17; Her same Dermagraft on for about 3-1/2 weeks. In spite of this the wound arrives looking quite healthy. There is been a lot of healing dimensions are smaller. Looking at the square shaped wound she has now there is some undermining and some depth medially under the undermining although I cannot palpate any bone. No surrounding infection is obvious. She has difficult questions about how to look at this going forward vis--vis amputations versus continued medical therapy. T be truthful the wound is looks so o healthy and it is continued to contract. Hard to justify foot surgery at this point although I still told her that I think it might come to that if we are not able to eradicate the underlying MRSA. She is still highly at risk and she understands this 11/06/17 on evaluation today patient appears to be doing better in regard to her foot ulcer. She's been tolerating the dressing changes without complication. Currently she is here for her Dermagraft #6. Her wound continues to make excellent progress at this point. She does not appear to have any evidence of infection which is good news. 11/13/17 on evaluation today patient appears to be doing excellent at this time. She is here for repeat Dermagraft application. This is #7. Overall her wound seems to be making great progress. 12/05/17; the patient arrives with the wound in much better condition than when I last saw this almost 6 weeks ago. She still has  a small probing area in the left metatarsal head region on the lateral aspect of her foot. We applied her last Dermagraft today. ooSince the last time she is here she has what appears to a been a blood blister on the plantar aspect of left foot although I don't see this is threatening. There is also a thick raised tissue on the right mid metatarsal head region. This was not there I don't think the last time she was here 3 weeks ago. 12/12/17; the patient continues to have a small programming area in the left metatarsal head region on the lateral aspect of her foot which was the initial large surgical wound. I applied her last Apligraf last week. I'm going to use Endoform starting today ooUnfortunately she has an excoriated area in the left mid foot and the right mid foot. The left midfoot looks like a blistered area this was not opened last week it certainly is open today. Using silver alginate on these areas. She promises me she is offloading this. 12/19/17; the small probing area in the left metatarsal head eyes think is shallower. In general her original wound looks better. We've been using Endoform. The area inferiorly that I think was trauma last week still requires debridement a lot of nonviable surface which I removed. She still has an open open area distally in her foot ooSimilarly on the right foot there is tightly adherent surface debris which I removed. Still areas that don't look completely epithelialized. This is a small open area. We used silver alginate on these areas 12/26/2017; the patient  did not have the supplies we ordered from last week including the Endoform. The original large wound on the left lateral foot looks healthy. She still has the undermining area that is largely unchanged from last week. She has the same heavily callused raised edged wounds on the right mid and left midfoot. Both of these requiring debridement. We have been using silver alginate on these  areas 01/02/2018; there is still supply issues. We are going to try to use Prisma but I am not sure she actually got it from what she is saying. She has a new open area on the lateral aspect of the left fourth toe [previous fifth ray amputation]. Still the one tunneling area over the fourth metatarsal head. The area is in the midfoot bilaterally still have thick callus around them. She is concerned about a raised swelling on the lateral aspect of the foot. However she is completely insensate 01/10/2018; we are using Prisma to the wounds on her bilateral feet. Surprisingly the tunneling area over the left fourth metatarsal head that was part of her original surgery has closed down. She has a small open area remaining on the incision line. 2 open areas in the midfoot. 02/10/2018; the patient arrives back in clinic after a month hiatus. She was traveling to visit family in [place]. Is fairly clear she was not offloading the areas on her feet. The original wound over the left lateral foot at the level of metatarsal heads is reopened and probes medially by about a centimeter or 2. She notes that a week ago she had purulent drainage come out of an area on the left midfoot. Paradoxically the worst area is actually on the right foot is extensive with purulent drainage. We will use silver alginate today 02/17/2018; the patient has 3 wounds one over the left lateral foot. She still has a small area over the metatarsal heads which is the remnant of her original surgical wound. This has medial probing depth of roughly 1.4 cm somewhat better than last week. The area on the right foot is larger. We have been using silver alginate to all areas. The area on the right foot and left foot that we cultured last week showed both Klebsiella and Proteus. Both of these are quinolone sensitive. The patient put her's self on Bactrim and Flagyl that she had left hanging around from prior antibiotic usages. She was apparently on  this last week when she arrived. I did not realize this. Unfortunately the Bactrim will not cover either 1 of these organisms. We will send in Cipro 500 twice daily for a week 03/04/2018; the patient has 2 wounds on the left foot one is the original wound which was a surgical wound for a deep DFU. At one point this had exposed bone. She still has an area over the fourth metatarsal head that probes about 1.4 cm although I think this is better than last week. I been using silver nitrate to try and promote tissue adherence and been using silver alginate here. ooShe also has an area in the left midfoot. This has some depth but a small linear wound. Still requiring debridement. ooOn the right midfoot is a circular wound. A lot of thick callus around this area. ooWe have been using silver alginate to all wound areas ooShe is completed the ciprofloxacin I gave her 2 weeks ago. 03/11/2018; the patient continues to have 2 open areas on the left foot 1 of which was the original surgical wound for a deep  DFU. Only a small probing area remains although this is not much different from last week we have been using silver alginate. The other area is on the midfoot this is smaller linear but still with some depth. We have been using silver alginate here as well ooOn the right foot she has a small circular wound in the mid aspect. This is not much smaller than last time. We have been using silver alginate here as well 03/18/2018; she has 3 wounds on the left foot the original surgical wound, a very superficial wound in the mid aspect and then finally the area in the mid plantar foot. She arrives in today with a very concerning area in the wound in the mid plantar foot which is her most proximal wound. There is undermining here of roughly 1-1/2 cm superiorly. Serosanguineous drainage. She tells me she had some pain on for over the weekend that shot up her foot into her thigh and she tells me that she had a nodule  in the groin area. ooShe has the single wound in the right foot. ooWe are using endoform to both wound areas 03/24/2018; the patient arrives with the original surgical wound in the area on the left midfoot about the same as last week. There is a collection of fluid under the surface of the skin extending from the surgical wound towards the midfoot although it does not reach the midfoot wound. The area on the right foot is about the same. Cultures from last week of the left midfoot wound showed abundant Klebsiella abundant Enterococcus faecalis and moderate methicillin resistant staph I gave her Levaquin but this would have only covered the Klebsiella. She will need linezolid 04/01/2018; she is taking linezolid but for the first few days only took 1 a day. I have advised her to finish this at twice daily dosing. In any case all of her wounds are a lot better especially on the left foot. The original surgical wound is closed. The area on the left midfoot considerably smaller. The area on the right foot also smaller. 04/08/2018; her original surgical wound/osteomyelitis on the left foot remains closed. She has area on the left foot that is in the midfoot area but she had some streaking towards this. This is not connected with her original wound at least not visually. ooSmall wound on the right midfoot appears somewhat smaller. 04/15/18; both wounds looks better. Original wound is better left midfoot. Using silver alginate 1/21; patient states she uses saltwater soak in, stones or remove callus from around her wounds. She is also concerned about a blood blister she had on the left foot but it simply resolved on its own. We've been using silver alginate 1/28; the patient arrives today with the same streaking area from her metatarsals laterally [the site of her original surgical wound] down to the middle of her foot. There is some drainage in the subcutaneous area here. This concerns me that there is  actually continued ongoing infection in the metatarsals probably the fourth and third. This fixates an MRI of the foot without contrast [chronic renal failure] ooThe wound in the mid part of the foot is small but I wonder whether this area actually connects with the more distal foot. ooThe area on the right midfoot is probably about the same. Callus thick skin around the small wound which I removed with a curette we have been using silver alginate on both wound areas 2/4; culture I did of the draining site on the left  foot last time grew methicillin sensitive staph aureus. MRI of the left foot showed interval resolution of the findings surrounding the third metatarsal joint on the prior study consistent with treated osteomyelitis. Chronic soft tissue ulceration in the plantar and lateral aspect of the forefoot without residual focal fluid collection. No evidence of recurrent osteomyelitis. 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 She tells me she had nausea and vomiting over the weekend and missed 2 days. In general her foot looks much the same. She has a small open area just below the left fourth metatarsal head. A linear area in the left midfoot. Some discoloration extending from the inferior part of this into the left lateral foot although this appears to be superficial. She has a small area on the right midfoot which generally looks smaller after debridement 2/18; the patient is completing his cephalexin and has another 2 days. She continues to have open areas on the left and right foot. 2/25; she is now off antibiotics. The area on the left foot at the site of her original surgical wound has closed yet again. She still has open areas in the mid  part of her foot however these appear smaller. The area on the right mid foot looks about the same. We have been using silver alginate She tells me she had a serious hypoglycemic spell at home. She had to have EMS called and get IV dextrose 3/3; disappointing on the left lateral foot large area of necrotic tissue surrounding the linear area. This appears to track up towards the same original surgical wound. Required extensive debridement. The area on the right plantar foot is not a lot better also using silver 3/12; the culture I did last time showed abundant enterococcus. I have prescribed Augmentin, should cover any unrecognized anaerobes as well. In addition there were a few MRSA and Serratia that would not be well covered although I did not want to give her multiple antibiotics. She comes in today with a new wound in the right midfoot this is not connected with the original wound over her MTP a lot of thick callus tissue around both wounds but once again she said she is not walking on these areas 3/17-Patient comes in for follow-up on the bilateral plantar wounds, the right midfoot and the left plantar wound. Both these are heavily callused surrounding the wounds. We are continuing to use silver alginate, she is compliant with offloading and states she uses a wheelchair fairly often at home 3/24; both wound areas have thick callus. However things actually look quite a bit better here for the majority of her left foot and the right foot. 3/31; patient continues to have thick callused somewhat irritated looking tissue around the wounds which individually are fairly superficial. There is no evidence of surrounding infection. We have been using silver alginate however I change that to [facility] today 4/17; patient returns to clinic after having a scare with Covid she tested negative in her primary doctor's office. She has been using Hydrofera Blue. She does not have an open area on the right  foot. On the left foot she has a small open area with the mid area not completely viable. She showed me pictures of what looks like a hemorrhagic blister  from several days ago but that seems to have healed over this was on the lateral left foot 4/21; patient comes in to clinic with both her wounds on her feet closed. However over the weekend she started having pain in her right foot and leg up into the thigh. She felt as though she was running a low-grade fever but did not take her temperature. She took a doxycycline that she had leftover and yesterday a single Septra and metronidazole. She thinks things feel somewhat better. 4/28; duplex ultrasound I ordered last week was negative for DVT or superficial thrombophlebitis. She is completed the doxycycline I gave her. States she is still having a lot of pain in the right calf and right ankle which is no better than last week. She cannot sleep. She also states she has a temperature of up to 101, coughing and complaining of visual loss in her bilateral eyes. Apparently she was tested for Covid 2 weeks ago at [facility] and that was negative. Readmission: 09/03/18 patient presents back for reevaluation after having been evaluated at the end of April regarding erythema and swelling of her right lower extremity. Subsequently she ended up going to the hospital on 07/29/18 and was admitted not to be discharged until 08/08/18. Unfortunately it was noted during the time that she was in the hospital that she did have methicillin-resistant Staphylococcus aureus as the infection noted at the site. It was also determined that she did have osteomyelitis which appears to be fairly significant. She was treated with vancomycin and in fact is still on IV vancomycin at dialysis currently. This is actually slated to continue until 09/12/18 at least which will be the completion of the six weeks of therapy. Nonetheless based on what I'm seeing at this point I'm not sure she will be  anywhere near ready to discontinue antibiotics at that time. Since she was released from the hospital she was seen by [name] who is her podiatrist on 08/27/18. His note specifically states that he is recommended that the patient needs of one knee amputation on the right as she has a life- threatening situation that can lead quickly to sepsis. The patient advised she would like to try to save her leg to which [name] apparently told her that this was against all medical advice. She also want to discontinue the Wound VAC which had been initiated due to the fact that she wasn't pleased with how the wound was looking and subsequently she wanted to pursue applying Medihoney at that time. He stated that he did not believe that the right lower extremity was salvageable and that the patient understood but would still like to attempt hyperbaric option therapy if it could be of any benefit. She was therefore referred back to [place] for further evaluation. He plans to see her back next week. Upon inspection today patient has a significant amount purulent drainage noted from the wound at this point. The bone in the distal portion of her foot also appears to be extremely necrotic and spongy. When I push down on the bone it bubbles and seeps purulent drainage from deeper in the end of the foot. I do not think that this is likely going to heal very well at all and less aggressive surgical debridement were undertaken more than what I believe we can likely do here in our office. 09/12/2018; I have not seen this patient since the most recent hospitalization although she was in our clinic last week. I have reviewed some  of her records from a complex hospitalization. 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 There is situation was complicated by MRSA bacteremia and acute on chronic renal failure now on dialysis. She is receiving vancomycin at dialysis. We started her on Dakin's  wet-to-dry last week she is changing this daily. There is still purulent drainage coming out of her foot. Although she is apparently "agreeable" to a below-knee amputation which is been suggested by multiple clinicians she wants this to be done in [place]. She apparently has a telehealth visit with that provider sometime in late [place] 6/24. I have told her I think this is probably too long. Nevertheless I could not convince her to allow a local doctor to perform BKA. 09/19/2018; the patient has a large necrotic area on the right anterior foot. She has had previous transmetatarsal amputations. Culture I did last week showed MRSA nothing else she is on vancomycin at dialysis. She has continued leaking purulent drainage out of the distal part of the large circular wound on the right anterior foot. She apparently went to see Dr. 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 Somehow that translated into her being referred to plastic surgery for debridement of the area. I gather she basically refused amputation although I do not have a copy of Dr. Berenice Primas notes. The patient really wants to have a trial of hyperbaric oxygen. I agreed with initial assessment in this clinic that this was probably too far along to benefit however if she is going to have plastic surgery I think she would benefit from ancillary hyperbaric oxygen. The issue here is that the patient has benefited as maximally as any patient I have ever seen from hyperbaric oxygen therapy. Most recently she had exposed bone on the lateral part of her left foot after a surgical procedure and that actually has closed. She has eschared areas in both heels but no open area. She is remained systemically well. I am not optimistic that anything can be done about this but the patient is very clear that she wants an attempt. The attempt would include a wound VAC further debridements and hyperbaric oxygen along with IV  antibiotics. 6/26; I put her in for a trial of hyperbaric oxygen only because of the dramatic response she has had with wounds on her left midfoot earlier this year which was a surgical wound that went straight to her bone over the metatarsal heads and also remotely the left third toe. We will see if we can get this through our review process and insurance. She arrives in clinic with again purulent material pouring out of necrotic bone on the top of the foot distally. There is also some concerning erythema on the front of the leg that we marked. It is bit difficult to tell how tender this is because of neuropathy. I note from infectious disease that she had her vancomycin extended. 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 She had the wound VAC on for part of the week. The rest of the time she is putting various things on this including Medihoney, "ionized water" silver sorb gel etc. 7/7; follow-up along with HBO. She is still on vancomycin at dialysis. She has a large open area on the dorsal right foot and a small dark eschar area on her heel. There is a lot less erythema in the area and a lot less tenderness. From an infection point of view  I think this is better. She still has a lot of necrosis in the remaining right forefoot [previous TMA] we are still using the wound VAC in this area 7/16; follow-up along with HBO. I put her on linezolid after she finished her vancomycin. We started this last Friday I gave her 2 weeks worth. I had the expectation that she would be operatively debrided by Dr. Marla Roe but that still has not happened yet. Patient phoned the office this week. She arrives for review today after HBO. The distal part of this wound is completely necrotic. Nonviable pieces of tendon bone was still purulent drainage. Also concerning that she has black eschar over the heel that is expanding. I think this may be indicative of infection in this area as well.  She has less erythema and warmth in the ankle and calf but still an abnormal exam 7/21 follow-up along with HBO. I will renew her linezolid after checking a CBC with differential monitoring her blood counts especially her platelets. She was supposed to have surgery yesterday but if I am reading things correctly this was canceled after her blood sugar was found to be over 500. I thought Dr. Marla Roe who called me said that they were sending her to the ER but the patient states that was not the case. 7/28. Follow-up along with HBO. She is on linezolid I still do not have any lab work from dialysis even though I called last week. The patient is concerned about an area on her left lateral foot about the level of the base of her fifth metatarsal. I did not really see anything that ominous here however this patient is in [place] ability to point out problems that she is sensing and she has been accurate in the past Finally she received a call from Dr. Marla Roe who is referring her to another orthopedic surgeon stating that she is too booked up to take her to the operating room now. Was still using a wound VAC on the foot 8/3 -Follow-up after HBO, she is got another week of linezolid, she is to call ID for an appointment, x-rays of both feet were reviewed, the left foot x-ray with third MTP joint osteo- Right foot x-ray widespread osteo-in the right midfoot Right ankle x-ray does not show any active evidence of infection 8/11-Patient is seen after HBO, the wounds on the right foot appear to be about the same, the heel wound had some necrotic base over tendon that was debrided with a curette 8/21; patient is seen after HBO. The patient's wound on her dorsal foot actually looks reasonably good and there is substantial amount of epithelialization however the open area distally still has a lot of necrotic debris partially bone. I cannot really get a good sense of just how deep this probes under the foot.  She has been pressuring me this week to order medical maggots through a company in [place] for her. The problem I have is there is not a defined wound area here. On the positive side there is no purulence. She has been to see infectious disease she is still on Septra DS although I have not had a chance to review their notes 8/28; patient is seen in conjunction with HBO. The wounds on her foot continued to improve including the right dorsal foot substantially the, the distal part of this wound and the area on the right heel. We have been using a wound VAC over this chronically. She is still on trimethoprim as directed by  infectious disease 9/4; patient is seen in conjunction with HBO. Right dorsal foot wound substantially anteriorly is better however she continues to have a deep wound in the distal part of this that is not responding. We have been using silver collagen under border foam ooArea on the right plantar medial heel seems better. We have been using Hydrofera Blue 12/12/18 on evaluation today patient appears to be doing about the same with regard to her wound based on prior measurements. She does have some necrotic tissue noted on the lateral aspect of the wound that is going require a little bit of sharp debridement today. This includes what appears to be potentially either severely necrotic bone or tendon. Nonetheless other than that she does not appear to have any severe infection which is good news 9/18; it is been 2 weeks since I saw this wound. She is tolerating HBO well. Continued dramatic improvement in the area on the right dorsal foot. She still has a small wound on the heel that we have been using Hydrofera Blue. She continues with a wound VAC 9/24; patient has to be seen emergently today with a swelling on her right lateral lower leg. She says that she told Dr. Evette Doffing about this and also myself on a couple of occasions but I really have no recollection of this. She is not  systemically unwell and her wound really looked good the last time I saw this. She showed this to providers at dialysis and she was able to verify that she was started on cephalexin today for 5 doses at dialysis. She dialyzes on Tuesday Thursday and Saturday. 10/2; patient is seen in conjunction with HBO. The area that is draining on the right anterior medial tibia is more extensive. Copious amounts of serosanguineous drainage with some purulence. We are still using the wound VAC on the original wound then it is stable. Culture I did of the original IandD showed MRSA I contacted dialysis she is now on vancomycin with dialysis treatments. I asked them to run a month 10/9; patient seen in conjunction with HBO. She had a new spontaneous open area just above the wound on the right medial tibia ankle. More swelling on the right medial tibia. Her wound on the foot looks about the same perhaps slightly better. There is no warmth spreading up her leg but no obvious erythema. her MRI of the foot and ankle and distal tib-fib is not booked for next Friday I discussed this with her in great detail over multiple days. it is likely she has spreading infection upper leg at least involving the distal 25% above the ankle. She knows that if I refer her to orthopedics for infectious disease they are going to recommend amputation and indeed I am not against this myself. We had a good trial at trying to heal the foot which is what she wanted along with antibiotics debridement and HBO however she clearly has spreading infection [probably staph aureus/MRSA]. Nevertheless she once again tells me she wants to wait the left of the MRI. She still makes comments about having her amputation done in [place]. 10/19; arrives today with significant swelling on the lateral right leg. Last culture I did showed Klebsiella. Multidrug-resistant. Cipro was intermediate sensitivity and that is what I have her on pending her MRI which  apparently is going to be done on Thursday this week although this seems to be moving back and forth. She is not systemically unwell. We are using silver alginate on her major wound  area on the right medial foot and the draining areas on the right lateral lower leg 10/26; MRI showed extensive abscess in the anterior compartment of the right leg also widespread osteomyelitis involving osseous structures of the midfoot and portions of the hindfoot. Also suspicion for osteomyelitis anterior aspect of the distal medial malleolus. Culture I did of the purulence once again showed a multidrug-resistant Klebsiella. I have been in contact with nephrology late last week and she has been started on cefepime at dialysis to replace the vancomycin We sent a copy of her MRI report to Dr. Geroge Baseman in [place] who is an orthopedic surgeon. The patient takes great stock in his opinion on this. She says she will go to [place] to have her leg amputated if Dr. Geroge Baseman does not feel there is any salvage options. 11/2; she still is not talk to her orthopedic surgeon in [place]. Apparently he will call her at [phone] this afternoon. The quality of this is she has not allowed me to refer her anywhere. She has been told over and over that she needs this amputated but has not agreed to be referred. She tells me her blood sugar was 600 last night but she has not been febrile. 11/9; she never did got a call from the orthopedic surgeon in [place] therefore that is off the radar. We have arranged to get her see orthopedic surgery at [facility]. She still has a lot of draining purulence coming out of the new abscess in her right leg although that probably came from the osteomyelitis in her right foot and heel. Meanwhile the original wound on the right foot looks very healthy. Continued improvement. The issue is that the last MRI showed osteomyelitis in her right foot extensively she now has an abscess in the right anterior lower  leg. There is nobody in [place] who will offer this woman anything but an amputation and to be honest that is probably what she needs. I think she still wants to talk about limb salvage although at this point I just do not see that. She has completed her vancomycin at dialysis which was for the original staph aureus she is still on cefepime for the more recent Klebsiella. She has had a long course of both of these antibiotics which should have benefited the osteomyelitis on the right foot as well as the abscess. 11/16; apparently [place] elective surgery is shut down because of COVID-19 pandemic. I have reached out to some contacts at [facility] to see if we can get her an orthopedic appointment there. I am concerned about continually leaving this but for the moment everything is static. In fact her original large wound on this foot is closing down. It is the abscess on the right anterior leg that continues to drain purulent serosanguineous material. She is not currently on any antibiotics however she had a prolonged course of vancomycin [1 month] as well as cefepime for a month 02/24/2019 on evaluation today patient appears to be doing better than the last time I saw her. This is not a patient that I typically see. With that being said I am covering for Dr. Dellia Nims this week and again compared to when I last saw her overall the wounds in particular seem to be doing significantly better which is good news. With that being said the patient tells me several disconcerting things. She has not been able to get in to see anyone for potential debridement in regard to her leg wounds although she tells me that  she does not think it is necessary any longer because she is taking care of that herself. She noticed a string coming out of the lower wound on her leg over the last week. The patient states that she subsequently decided that we must of pack something in there and started pulling the string out and as  it kept coming and coming she realized this was likely her tendon. With that being said she continued to remove as much of this as she could. She then I subsequently proceeded to using tubes of antibiotic ointment which she will stick down into the wound and then scored as much as she can until she sees it coming out of the other wound opening. She states that in doing this she is actually made things better and there is less redness and irritation. With regard to her foot wound she does have some necrotic tendon and tissue noted in one small corner but again the actual wound itself seems to be doing better with good granulation in general compared to my last evaluation. 12/7; continued improvement in the wound on the substantial part of the right medial foot. Still a necrotic area inferiorly that required debridement but the rest of this looks very healthy and is contracting. She has 2 wounds on the right lateral leg which were her original drainage sites from her abscess but all of this looks a lot better as well. She has been using silver alginate after putting antibiotic biotic ointment in one wound and watching it come out the other. I have talked to her in some detail today. I had given her names of orthopedic surgeons at [facility] for second opinion on what to do about the right leg. I do not think the patient never called them. She has not been able to get a hold of the orthopedic surgeon in [place] that she had put a lot of faith in as being somebody would give her an opinion that she would trust. I talked to her today and said even if I could get her in to another orthopedic surgeon about the leg which she accept an amputation and she said she would not therefore I am not going to press this issue for the moment 12/14; continued improvement in his substantial wound on the right medial foot. There is still a necrotic area inferiorly with tightly adherent necrotic debris which I have been  working on debriding each time she is here. She does not have an orthopedic appointment. Since last time she was here I looked over her cultures which were essentially MRSA on the foot wound and gram-negative rods in the abscess on the anterior leg. 12/21; continued improvement in the area on the right medial foot. She is not up on this much and that is probably a good thing since I do not know it could support continuous ambulation. She has a small area on the right lateral leg which were remanence of the IandD's I did because of the abscess. I think she should probably have prophylactic antibiotics I am going to have to look this over to see if we can make an intelligent decision here. In the meantime her major wound is come down nicely. Necrotic area inferiorly is still there but looks a lot better 04/06/2019; she has had some improvement in the overall surface area on the right medial foot somewhat narrowedr both but somewhat longer. The areas on the right lateral leg which were initial IandD sites are superficial. Nothing is  present on the right heel. We are using silver alginate to the wound areas 1/18; right medial foot somewhat smaller. Still a deep probing area in the most distal recess of the wound. She has nothing open on the right leg. She has a new wound on the plantar aspect of her left fourth toe which may have come from just pulling skin. The patient using Medihoney on the wound on her foot under silver alginate. I cannot discourage her from this 2/1; 2-week follow-up using silver alginate on the right foot and her left fourth toe. The area on the right dorsal foot is contracted although there is still the deep area in the most distal part of the wound but still has some probing depth. No overt infection 2/15; 2-week follow-up. She continues to have improvement in the surface area on the dorsal right foot. Even the tunneling area from last time is almost closed. The area that was on the  plantar part of her left fourth toe over the PIP is indeed closed 3/1; 2-week follow-up. Continued improvement in surface area. The original divot that we have been debriding inferiorly I think has full epithelialization although the epithelialization is gone down into the wound with probably 4 mm of depth. Even under intense illumination I am unable to see anything open here. The remanence of the wound in this area actually look quite healthy. We have been using silver alginate 3/15; 2-week follow-up. Unfortunately not as good today. She has a comma shaped wound on the dorsal foot however the upper part of this is larger. Under illumination debris on the surface She also tells [place] that she was on her right leg 2 times in the last couple of weeks mostly to reach up for things above her head etc. She felt a sharp pain in the right leg which she thinks is somewhere from the ankle to the knee. The patient has neuropathy and is really uncertain. She cannot feel her foot so she does not think it was coming from there 3/29; 2-week follow-up. Her wound measures smaller. Surface of the wound appears reasonable. She is using silver alginate with underlying Medihoney. She has home health. X-rays I did of her tib-fib last time were negative although it did show arterial calcification 4/12; 2-week follow-up. Her wound measures smaller in length. Using manuka honey with silver alginate on top. She has home health. 4/26; 2-week follow-up. Her wound is smaller but still very adherent debris under illumination requiring debridement she has been using manuka honey with silver alginate. 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 Been using Hydrofera Blue. She is on suppressive Bactrim but apparently she has been taking it twice daily 6/7; I have not seen this wound and about 6 weeks. Since then she was up in  [place]. By her own admission she was walking on the foot because she did not have a wheelchair. The wound is not nearly as healthy looking as it was the last time I saw this. We ordered different things for her but she only uses Medihoney and silver alginate. As far as I know she is on suppressive trimethoprim sulfamethoxazole. She does not admit to any fever or chills. Her CBGs apparently are at baseline however she is saying that she feels some discomfort on the lateral part of her ankle I looked over her last inflammatory markers from the summer  2020 at which time she had a deeply necrotic infected wound in this area. On 11/10/2018 her sedimentation rate was 56 and C-reactive protein 9.9. This was 107 and 29 on 07/29/2018. 6/17; the patient had a necrotic wound the last time she was here on the right dorsal foot. After debridement I did a culture. This showed a very resistant ESBL Klebsiella as well as Enterococcus. Her x-ray of the foot which was done because of warmth and some discomfort showed bone destruction within the carpal bones involving the navicular acute cuboid lateral middle cuneiforms but essentially unchanged from her prior study which was done on 10/29/2018. The findings were felt to represent chronic osteomyelitis. We did inflammatory markers on her. Her white count was 5.25 sedimentation rate 16 and C-reactive protein at 11.1. Notable for the fact that in August 2020 her CRP was 9.9 and sedimentation rate 56. I have looked at her x-rays. It is true that the bone destruction is very impressive however the patient came into this clinic for the wound on her right foot with pieces of bone literally falling out anteriorly with purulent material. I am not exactly sure I could have expected anything different. She has not been systemically unwell no fever chills or blood sugars have been reasonable. 6/28; she arrives with a right heel closed. The substantial area on the right anterior foot  looks healthy. Much better looking surface. I think we can change to [facility] seems to help this previously. She is getting her antibiotics at dialysis she should be just about finished 7/9; changed to [facility] last week. Surface wound looks satisfactory not much change in surface area however. She is going to [place] state next week this is usually a difficult thing for this patient follow-up will be for 2 weeks. 7/23; using Hydrofera Blue. She returns from her trip and the wound looks surprisingly good. Usually when this patient goes on trips she comes back with a lot of problems with the wound. She is saying that she sometimes feels an episodic "crunching" feeling on the lateral part of the foot. She is neuropathic and not feeling pain but wonders whether this could be a neuropathic dysesthesia. 11/13/19-Patient returns after 3 weeks, the wound itself is stable and patient states that there is nothing new going on she is on some extra anxiety medications and is resisting the temptation to pick at the dry skin around the wound. 9/20; patient has not been here in over a month and I have not seen her in 2 months. The wound in terms of size I think is about the same. There is no exposed bone. She has a nonviable surface on this. She is supposed to be using [facility] however she is also been using some form of honey preparation as well as a silver-based dressings. I do not think she has any pattern to this. 10/4; 2-week follow-up. Patient has been using some form of spray which she says has honey and silver to purchase this online she has been covering it with gauze. In spite of this the wound actually looks quite good. The deeper divot distally appears to be close down. There is a rim of epithelialization. 10/18; 2-week follow-up. Patient has been using her Hydrofera Blue covered with her silver honey spray that she got online. 11/1; 2-week follow-up. She is using Hydrofera Blue with  a silver honey spray. Wound bed is measuring smaller. She has noticed that her foot is warmer on the right. She is  concerned about infection. For a long period of time I had her on prophylactic trimethoprim sulfamethoxazole DS 1 tablet daily. She is asking for this to be restarted. The patient is walking on this foot because of repairs that are being done in a home her but her room is on the second floor she has to go up and down stairs. I have cautioned against this however as usual she will do exactly what she wants to do 11/15; 2-week follow-up. She uses Hydrofera Blue with a silver/honey spray which I have never heard of. I think her wound looks about the same. Some epithelialization. No evidence that this is infected. I think she is walking on this more than we agreed on. She is going on extensive vacation over Thanksgiving 12/3; 2-week follow-up. She is using Hydrofera Blue however over Thanksgiving she ran out of this and she is simply been using Medihoney. In spite of this her wound is smaller almost divided into 2 now. She traveled extensively over Thanksgiving and actually looks quite good in spite of this. Usually this is been a marker of problems for her 12/17; 2-week follow-up. She is using Hydrofera Blue. The wound is smaller. Debris on the surface of this is fibrinous. She is traveling to [place] over the holidays which never bodes well for her wounds. I think she is walking more on her feet then she is even willing to admit and she tells me she does walk 2/11; using Hydrofera Blue. Her wounds are contracting however she walks in the clinic with a history that she has not been able to eat she has had vomiting. She also has right eye problems. Her blood sugar was 567. She had blood work done at dialysis and we called there to get her blood work although it still had not returned although they should have this by the end of the day we were told. She also stated that she was not sure what  her blood sugar was as her glucose monitor was not working and not coming to tomorrow. She also for some reason does not think her insulin pump is working well. Her endocrinologist is Dr. Elayne Snare Objective Constitutional Patient is hypertensive.. Pulse regular and within target range for patient.Marland Kitchen Respirations regular, non-labored and within target range.. Temperature is normal and within the target range for the patient.Marland Kitchen Appears in no distress. Vitals Time Taken: 9:10 AM, Height: 67 in, Weight: 125 lbs, BMI: 19.6, Temperature: 98.5 F, Pulse: 108 bpm, Respiratory Rate: 17 breaths/min, Blood Pressure: 200/100 mmHg, Capillary Blood Glucose: 594 mg/dl. General Notes: Dr. Dellia Nims notified of elevated blood sugar and BP, recommended patient go to ER for evaluation, but pt refused. Gastrointestinal (GI) Abdomen is soft and non-distended without masses or tenderness.. No liver or spleen enlargement. 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 General Notes: Wound exam; her wounds actually look quite good. Very minimal remaining area. No debridement was necessary. She is awake alert and conversational Integumentary (Hair, Skin) Wound #43 status is Open. Original cause of wound was Gradually Appeared. The wound is located on the Right,Medial Foot. The wound measures 3.5cm length x 1.2cm width x 0.1cm depth; 3.299cm^2 area and 0.33cm^3 volume. There is Fat Layer (Subcutaneous Tissue) exposed. There is no tunneling or undermining noted. There is a medium amount of serosanguineous drainage noted. The wound margin is flat and intact. There is large (67-100%)  pink granulation within the wound bed. There is a small (1-33%) amount of necrotic tissue within the wound bed including Adherent Slough. 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 Bathing/ Shower/ Hygiene: May shower and wash wound with soap and water. Edema Control - Lymphedema / SCD / Other: Elevate legs to the level of the heart or above for 30 minutes daily and/or when sitting, a frequency of: - throughout the day Avoid standing for long periods of time. Moisturize legs daily. - with dressing changes Off-Loading: Other: - minimal weight bearing right foot Non Wound Condition: Other Non Wound Condition Orders/Instructions: - follow up with Dr. Baird Cancer for right eye palsy today Home Health: No change in wound care orders this week; continue Home Health for wound care. May utilize formulary equivalent dressing for wound treatment orders unless otherwise specified. Other Home Health Orders/Instructions: - Interim WOUND #43: - Foot Wound Laterality: Right, Medial Cleanser: Wound Cleanser (Home Health) Every Other Day/30 Days Discharge Instructions: Cleanse the wound with wound cleanser or normal saline prior to applying a clean dressing using gauze sponges, not tissue or cotton balls. Prim Dressing: MediHoney Calcium Alginate Dressing 4x5 in Every Other Day/30 Days ary Discharge Instructions: Apply to wound bed as instructed Secondary Dressing: Woven Gauze Sponge, Non-Sterile 4x4 in [facility]) Every Other Day/30 Days Discharge Instructions: Apply over primary dressing as directed. Secured With: Elastic Bandage 4 inch (ACE bandage) (Home Health) Every Other Day/30 Days Discharge Instructions: Secure with ACE bandage as directed. Secured With: [employer], 4.5x3.1 (in/yd) [facility]) Every Other Day/30 Days Discharge Instructions: Secure with Kerlix as directed. Secured With: Paper T ape,  2x10 (in/yd) (Home Health) Every Other Day/30 Days Discharge Instructions: Secure dressing with tape as directed. 1. The patient's wound on her right foot actually look excellent and there is really no change recommended. But this was not the focus of a very long visit 2. I ultimately spoke with her ophthalmologist Dr. Baird Cancer at [facility] retina specialist he had already diagnosed the 3rd nerve palsy and was concerned enough to order an MRI of her head. We were able to track this down there was some changes in the periventricular white matter on the left however nothing else specifically abnormal no masses no abscesses no bleeding nothing really in the orbits to suggest a difficulty on the right. She did complain of intense headache which is improved. Diabetic 3rd nerve problems are usually painless. 3. I will see if I can track down where MRI was done and go over this with the neuroradiologist question is whether she needs a CTA, she could not have a contrast study with an MRI because of her chronic renal failure dialysis setting 4. Nausea and vomiting, her abdomen is benign. She went over this with her nephrologist earlier in the week and they ordered lab work on Tuesday which they think will be back by the end of today. In view of this I did not send her to the emergency room. The concern was diabetic ketoacidosis 5. I asked her to call Elayne Snare this afternoon to see what she needs to do about the hyperglycemia and if we can verify that her infusion pump is actually working. Her monitor comes tomorrow according to  the patient Electronic Signature(s) Signed: 05/13/2020 5:36:32 PM By: Linton Ham MD Entered By: Linton Ham on 05/13/2020 12:46:36 -------------------------------------------------------------------------------- SuperBill Details Patient Name: Date of Service: [place] LLO [place] [name] 05/13/2020 Medical Record Number: [id number] Patient Account Number: [id number] Date of  Birth/Sex: Treating RN: [birth year] (50 y.o. 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

## 2020-05-16 ENCOUNTER — Ambulatory Visit: Payer: Medicare HMO | Admitting: Endocrinology

## 2020-05-16 ENCOUNTER — Telehealth: Payer: Self-pay | Admitting: Endocrinology

## 2020-05-16 NOTE — Progress Notes (Signed)
LITA, FLYNN ([id number]) Visit Report for 05/13/2020 Arrival Information Details Patient Name: Date of Service: Nancy LLO Abbott Pao Nancy L. 05/13/2020 9:00 [place] Record Number: [id number] Patient Account Number: [id number] Date of Birth/Sex: Treating RN: November 26, 1970 (50 y.o. [name] Primary Care Kresta Templeman: [name], [name] Other Clinician: Referring Ladon Heney: Treating Dylan Ruotolo/Extender: [name], [name] Weeks in Treatment: 62 Visit Information History Since Last Visit Added or deleted any medications: No Patient Arrived: Wheel Chair Any new allergies or adverse reactions: No Arrival Time: 09:10 Had a fall or experienced change in No Accompanied By: self activities of daily living that may affect Transfer Assistance: Manual risk of falls: Patient Identification Verified: Yes Signs or symptoms of abuse/neglect since last visito No Secondary Verification Process Completed: Yes Hospitalized since last visit: No Patient Requires Transmission-Based Precautions: No Implantable device outside of the clinic excluding No Patient Has Alerts: No cellular tissue based products placed in the center since last visit: Has Dressing in Place as Prescribed: Yes Pain Present Now: No Electronic Signature(s) Signed: 05/13/2020 1:25:35 PM By: Sandre Kitty Entered By: Sandre Kitty on 05/13/2020 09:10:35 -------------------------------------------------------------------------------- Clinic Level of Care Assessment Details Patient Name: Date of Service: [place] Y, JEA Nancy L. 05/13/2020 9:00 [place] Record Number: [id number] Patient Account Number: [id number] Date of Birth/Sex: Treating RN: October 25, 1970 (50 y.o. [name] Primary Care Trevionne Advani: [name], [name] Other Clinician: Referring Dalina Samara: Treating Clenton Esper/Extender: [name], [name] Weeks in Treatment: 35 Clinic Level of Care Assessment Items TOOL 4 Quantity Score []   - 0 Use when only an EandM is performed on FOLLOW-UP visit ASSESSMENTS - Nursing Assessment / Reassessment X- 1 10 Reassessment of Co-morbidities (includes updates in patient status) X- 1 5 Reassessment of Adherence to Treatment Plan ASSESSMENTS - Wound and Skin A ssessment / Reassessment X - Simple Wound Assessment / Reassessment - one wound 1 5 []  - 0 Complex Wound Assessment / Reassessment - multiple wounds []  - 0 Dermatologic / Skin Assessment (not related to wound area) ASSESSMENTS - Focused Assessment X- 1 5 Circumferential Edema Measurements - multi extremities []  - 0 Nutritional Assessment / Counseling / Intervention X- 1 5 Lower Extremity Assessment (monofilament, tuning fork, pulses) []  - 0 Peripheral Arterial Disease Assessment (using hand held doppler) ASSESSMENTS - Ostomy and/or Continence Assessment and Care []  - 0 Incontinence Assessment and Management []  - 0 Ostomy Care Assessment and Management (repouching, etc.) PROCESS - Coordination of Care X - Simple Patient / Family Education for ongoing care 1 15 []  - 0 Complex (extensive) Patient / Family Education for ongoing care X- 1 10 Staff obtains Programmer, systems, Records, T Results / Process Orders est X- 1 10 Staff telephones HHA, Nursing Homes / Clarify orders / etc []  - 0 Routine Transfer to another Facility (non-emergent condition) []  - 0 Routine Hospital Admission (non-emergent condition) []  - 0 New Admissions / Biomedical engineer / Ordering NPWT Apligraf, etc. , []  - 0 Emergency Hospital Admission (emergent condition) X- 1 10 Simple Discharge Coordination []  - 0 Complex (extensive) Discharge Coordination PROCESS - Special Needs []  - 0 Pediatric / Minor Patient Management []  - 0 Isolation Patient Management []  - 0 Hearing / Language / Visual special needs []  - 0 Assessment of Community assistance (transportation, D/C planning, etc.) []  - 0 Additional assistance / Altered mentation []  -  0 Support Surface(s) Assessment (bed, cushion, seat, etc.) INTERVENTIONS - Wound Cleansing / Measurement X - Simple Wound  Cleansing - one wound 1 5 $R'[]'ti$  - 0 Complex Wound Cleansing - multiple wounds X- 1 5 Wound Imaging (photographs - any number of wounds) $RemoveBe'[]'GelaOoFip$  - 0 Wound Tracing (instead of photographs) X- 1 5 Simple Wound Measurement - one wound $RemoveB'[]'ivbdKqJB$  - 0 Complex Wound Measurement - multiple wounds INTERVENTIONS - Wound Dressings X - Small Wound Dressing one or multiple wounds 1 10 $Re'[]'FqU$  - 0 Medium Wound Dressing one or multiple wounds $RemoveBeforeD'[]'FvZDlnXILsITyC$  - 0 Large Wound Dressing one or multiple wounds X- 1 5 Application of Medications - topical $RemoveB'[]'lQDeCQja$  - 0 Application of Medications - injection INTERVENTIONS - Miscellaneous $RemoveBeforeD'[]'zCnhPyYjesDWcq$  - 0 External ear exam $Remove'[]'nkhvtnS$  - 0 Specimen Collection (cultures, biopsies, blood, body fluids, etc.) $RemoveBefor'[]'tikSJvzKKnDO$  - 0 Specimen(s) / Culture(s) sent or taken to Lab for analysis $RemoveBefo'[]'RBAZUNBGBIS$  - 0 Patient Transfer (multiple staff / Civil Service fast streamer / Similar devices) $RemoveBeforeDE'[]'ahFHyCxacLiPNQs$  - 0 Simple Staple / Suture removal (25 or less) $Remove'[]'IyvEGYx$  - 0 Complex Staple / Suture removal (26 or more) $Remove'[]'FaYLxkB$  - 0 Hypo / Hyperglycemic Management (close monitor of Blood Glucose) $RemoveBefore'[]'gdNKLyhBXsseS$  - 0 Ankle / Brachial Index (ABI) - do not check if billed separately X- 1 5 Vital Signs Has the patient been seen at the hospital within the last three years: Yes Total Score: 110 Level Of Care: New/Established - Level 3 Electronic Signature(s) Signed: 05/13/2020 5:49:38 PM By: Baruch Gouty RN, BSN Entered By: Baruch Gouty on 05/13/2020 09:47:02 -------------------------------------------------------------------------------- Encounter Discharge Information Details Patient Name: Date of Service: Nancy LLO Abbott Pao Nancy L. 05/13/2020 9:00 [place] Medical Record Number: [id number] Patient Account Number: [id number] Date of Birth/Sex: Treating RN: [birth year] (50 y.o. [name] Primary Care Shaima Sardinas: [name], [name] Other Clinician: Referring Karin Griffith: Treating  Nathanyl Andujo/Extender: [name], [name] Weeks in Treatment: 34 Encounter Discharge Information Items Discharge Condition: Unstable Ambulatory Status: Wheelchair Discharge Destination: Home Transportation: Private Auto Accompanied By: self Schedule Follow-up Appointment: Yes Clinical Summary of Care: Notes Patient refuses to go to ED despite advisement by entire wound care clinic staff. Patient states, "They will do nothing for me." Rechecked BP manually 200/92. MD and case manager made aware. Electronic Signature(s) Signed: 05/13/2020 5:46:28 PM By: Deon Pilling Entered By: Deon Pilling on 05/13/2020 10:12:34 -------------------------------------------------------------------------------- Lower Extremity Assessment Details Patient Name: Date of Service: Nancy [name] Nancy L. 05/13/2020 9:00 [place] Record Number: [id number] Patient Account Number: [id number] Date of Birth/Sex: Treating RN: [birth year] (50 y.o. Nancy Fetter Primary Care Gareld Obrecht: [name], [name] Other Clinician: Referring Maaliyah Adolph: Treating Brier Reid/Extender: [name], [name] Weeks in Treatment: 88 Edema Assessment Assessed: [Left: No] [Right: No] Edema: [Left: N] [Right: o] Calf Left: Right: Point of Measurement: From Medial Instep 27.55 cm Ankle Left: Right: Point of Measurement: From Medial Instep 19 cm Vascular Assessment Pulses: Dorsalis Pedis Palpable: [Right:Yes] Electronic Signature(s) Signed: 05/16/2020 5:50:50 PM By: [name] RN, BSN Entered By: [name] on 05/13/2020 09:15:11 -------------------------------------------------------------------------------- Multi Wound Chart Details Patient Name: Date of Service: Nancy LLO Abbott Pao Nancy L. 05/13/2020 9:00 [place] Record Number: [id number] Patient Account Number: [id number] Date of Birth/Sex: Treating RN: Feb 09, 1971 (50 y.o. [name] Primary Care Breeonna Mone: [name], [name] Other  Clinician: Referring Beckham Capistran: Treating Nial Hawe/Extender: [name], [name] Weeks in Treatment: 77 Vital Signs Height(in): 67 Capillary Blood Glucose(mg/dl): 594 Weight(lbs): 125 Pulse(bpm): 108 Body Mass Index(BMI): 20 Blood Pressure(mmHg): 200/100 Temperature(F): 98.5 Respiratory Rate(breaths/min): 17 Photos: [43:No Photos Right, Medial  Foot] [N/A:N/A N/A] Wound Location: [43:Gradually Appeared] [N/A:N/A] Wounding Event: [43:Diabetic Wound/Ulcer of the Lower] [N/A:N/A] Primary Etiology: [43:Extremity Cataracts, Chronic sinus] [N/A:N/A] Comorbid History: [43:problems/congestion, Anemia, Sleep Apnea, Deep Vein Thrombosis, Hypertension, Peripheral Arterial Disease, Type I Diabetes, Osteoarthritis, Osteomyelitis, Neuropathy, Seizure Disorder 08/04/2018] [N/A:N/A] Date Acquired: [43:88] [N/A:N/A] Weeks of Treatment: [43:Open] [N/A:N/A] Wound Status: [43:Yes] [N/A:N/A] Clustered Wound: [43:3.5x1.2x0.1] [N/A:N/A] Measurements L x W x D (cm) [43:3.299] [N/A:N/A] A (cm) : rea [43:0.33] [N/A:N/A] Volume (cm) : [43:95.50%] [N/A:N/A] % Reduction in A rea: [43:98.50%] [N/A:N/A] % Reduction in Volume: [43:Grade 4] [N/A:N/A] Classification: [43:Medium] [N/A:N/A] Exudate A mount: [43:Serosanguineous] [N/A:N/A] Exudate Type: [43:red, brown] [N/A:N/A] Exudate Color: [43:Flat and Intact] [N/A:N/A] Wound Margin: [43:Large (67-100%)] [N/A:N/A] Granulation A mount: [43:Pink] [N/A:N/A] Granulation Quality: [43:Small (1-33%)] [N/A:N/A] Necrotic A mount: [43:Fat Layer (Subcutaneous Tissue): Yes N/A] Exposed Structures: [43:Fascia: No Tendon: No Muscle: No Joint: No Bone: No Small (1-33%)] [N/A:N/A] Treatment Notes Wound #43 (Foot) Wound Laterality: Right, Medial Cleanser Wound Cleanser Discharge Instruction: Cleanse the wound with wound cleanser or normal saline prior to applying a clean dressing using gauze sponges, not tissue or cotton balls. Peri-Wound  Care Topical Primary Dressing MediHoney Calcium Alginate Dressing 4x5 in Discharge Instruction: Apply to wound bed as instructed Secondary Dressing Woven Gauze Sponge, Non-Sterile 4x4 in Discharge Instruction: Apply over primary dressing as directed. Secured With Elastic Bandage 4 inch (ACE bandage) Discharge Instruction: Secure with ACE bandage as directed. Kerlix Roll Sterile, 4.5x3.1 (in/yd) Discharge Instruction: Secure with Kerlix as directed. Paper Tape, 2x10 (in/yd) Discharge Instruction: Secure dressing with tape as directed. Compression Wrap Compression Stockings Add-Ons Electronic Signature(s) Signed: 05/13/2020 5:36:32 PM By: Baltazar Najjar MD Signed: 05/13/2020 5:49:38 PM By: Zenaida Deed RN, BSN Entered By: Baltazar Najjar on 05/13/2020 12:36:33 -------------------------------------------------------------------------------- Multi-Disciplinary Care Plan Details Patient Name: Date of Service: Nancy LLO [place] Colin Ina Nancy L. 05/13/2020 9:00 A M Medical Record Number: [id number] Patient Account Number: [id number] Date of Birth/Sex: Treating RN: [birth year] (50 y.o. Tommye Standard Primary Care Stevan Eberwein: Gareth Eagle, [name] Other Clinician: Referring Jazia Faraci: Treating Lyndol Vanderheiden/Extender: Londell Moh, [name] Weeks in Treatment: 20 Active Inactive Nutrition Nursing Diagnoses: Imbalanced nutrition Impaired glucose control: actual or potential Potential for alteratiion in Nutrition/Potential for imbalanced nutrition Goals: Patient/caregiver agrees to and verbalizes understanding of need to use nutritional supplements and/or vitamins as prescribed Date Initiated: 09/03/2018 Date Inactivated: 10/07/2018 Target Resolution Date: 10/01/2018 Goal Status: Met Patient/caregiver will maintain therapeutic glucose control Date Initiated: 09/03/2018 Target Resolution Date: 06/10/2020 Goal Status: Active Interventions: Provide education on elevated blood sugars and impact on  wound healing Treatment Activities: Patient referred to Primary Care Physician for further nutritional evaluation : 09/03/2018 Notes: Wound/Skin Impairment Nursing Diagnoses: Impaired tissue integrity Knowledge deficit related to ulceration/compromised skin integrity Goals: Patient/caregiver will verbalize understanding of skin care regimen Date Initiated: 09/03/2018 Target Resolution Date: 05/20/2020 Goal Status: Active Ulcer/skin breakdown will have a volume reduction of 30% by week 4 Date Initiated: 09/03/2018 Date Inactivated: 10/07/2018 Target Resolution Date: 10/01/2018 Goal Status: Unmet Unmet Reason: COMORBITIES Ulcer/skin breakdown will have a volume reduction of 50% by week 8 Date Initiated: 10/07/2018 Date Inactivated: 11/03/2018 Target Resolution Date: 10/31/2018 Goal Status: Unmet Unmet Reason: Osteomyelitis Interventions: Assess patient/caregiver ability to obtain necessary supplies Assess patient/caregiver ability to perform ulcer/skin care regimen upon admission and as needed Assess ulceration(s) every visit Provide education on ulcer and skin care Treatment Activities: Skin care regimen initiated : 09/03/2018 Topical wound management initiated : 09/03/2018 Notes: Electronic Signature(s) Signed: 05/13/2020 5:49:38 PM By:  Baruch Gouty RN, BSN Entered By: Baruch Gouty on 05/13/2020 09:37:45 -------------------------------------------------------------------------------- Pain Assessment Details Patient Name: Date of Service: Nancy LLO Abbott Pao Nancy L. 05/13/2020 9:00 A M Medical Record Number: [id number] Patient Account Number: [id number] Date of Birth/Sex: Treating RN: Mar 04, 1971 (50 y.o. [name] Primary Care Anik Wesch: [name], [name] Other Clinician: Referring Tristan Bramble: Treating Shya Kovatch/Extender: [name], [name] Weeks in Treatment: 56 Active Problems Location of Pain Severity and Description of Pain Patient Has Paino No Site  Locations Pain Management and Medication Current Pain Management: Electronic Signature(s) Signed: 05/13/2020 1:25:35 PM By: Sandre Kitty Signed: 05/13/2020 5:49:38 PM By: Baruch Gouty RN, BSN Entered By: Sandre Kitty on 05/13/2020 09:11:50 -------------------------------------------------------------------------------- Patient/Caregiver Education Details Patient Name: Date of Service: Nancy LLO [place] Y, [place]. 2/11/2022andnbsp9:00 [place] Record Number: [id number] Patient Account Number: [id number] Date of Birth/Gender: Treating RN: May 13, 1970 (50 y.o. [name] Primary Care Physician: [name], [name] Other Clinician: Referring Physician: Treating Physician/Extender: [name], [name] Weeks in Treatment: 33 Education Assessment Education Provided To: Patient Education Topics Provided Elevated Blood Sugar/ Impact on Healing: Methods: Explain/Verbal Responses: Reinforcements needed, State content correctly Wound/Skin Impairment: Methods: Explain/Verbal Responses: Reinforcements needed, State content correctly Electronic Signature(s) Signed: 05/13/2020 5:49:38 PM By: Baruch Gouty RN, BSN Entered By: Baruch Gouty on 05/13/2020 09:41:51 -------------------------------------------------------------------------------- Wound Assessment Details Patient Name: Date of Service: Nancy LLO Abbott Pao Nancy L. 05/13/2020 9:00 A M Medical Record Number: [id number] Patient Account Number: [id number] Date of Birth/Sex: Treating RN: 04-24-1970 (50 y.o. [name] Primary Care Clotee Schlicker: [name], [name] Other Clinician: Referring Armoni Kludt: Treating Britteney Ayotte/Extender: [name], [name] Weeks in Treatment: 88 Wound Status Wound Number: 43 Primary Diabetic Wound/Ulcer of the Lower Extremity Etiology: Wound Location: Right, Medial Foot Wound Open Wounding Event: Gradually Appeared Status: Date Acquired: 08/04/2018 Comorbid  Cataracts, Chronic sinus problems/congestion, Anemia, Sleep Weeks Of Treatment: 88 History: Apnea, Deep Vein Thrombosis, Hypertension, Peripheral Arterial Clustered Wound: Yes Disease, Type I Diabetes, Osteoarthritis, Osteomyelitis, Neuropathy, Seizure Disorder Photos Photo Uploaded By: [name] on 05/16/2020 15:15:27 Wound Measurements Length: (cm) 3.5 Width: (cm) 1.2 Depth: (cm) 0.1 Area: (cm) 3.299 Volume: (cm) 0.33 % Reduction in Area: 95.5% % Reduction in Volume: 98.5% Epithelialization: Small (1-33%) Tunneling: No Undermining: No Wound Description Classification: Grade 4 Wound Margin: Flat and Intact Exudate Amount: Medium Exudate Type: Serosanguineous Exudate Color: red, brown Foul Odor After Cleansing: No Slough/Fibrino Yes Wound Bed Granulation Amount: Large (67-100%) Exposed Structure Granulation Quality: Pink Fascia Exposed: No Necrotic Amount: Small (1-33%) Fat Layer (Subcutaneous Tissue) Exposed: Yes Necrotic Quality: Adherent Slough Tendon Exposed: No Muscle Exposed: No Joint Exposed: No Bone Exposed: No Treatment Notes Wound #43 (Foot) Wound Laterality: Right, Medial Cleanser Wound Cleanser Discharge Instruction: Cleanse the wound with wound cleanser or normal saline prior to applying a clean dressing using gauze sponges, not tissue or cotton balls. Peri-Wound Care Topical Primary Dressing MediHoney Calcium Alginate Dressing 4x5 in Discharge Instruction: Apply to wound bed as instructed Secondary Dressing Woven Gauze Sponge, Non-Sterile 4x4 in Discharge Instruction: Apply over primary dressing as directed. Secured With Elastic Bandage 4 inch (ACE bandage) Discharge Instruction: Secure with ACE bandage as directed. Kerlix Roll Sterile, 4.5x3.1 (in/yd) Discharge Instruction: Secure with Kerlix as directed. Paper Tape, 2x10 (in/yd) Discharge Instruction: Secure dressing with tape as directed. Compression Wrap Compression  Stockings Add-Ons Electronic Signature(s) Signed: 05/13/2020 5:49:38 PM By: Baruch Gouty RN, BSN Signed: 05/16/2020 5:50:50 PM By:  [name] RN, BSN Entered By: [name] on 05/13/2020 09:15:35 -------------------------------------------------------------------------------- Vitals Details Patient Name: Date of Service: Nancy LLO Abbott Pao Nancy L. 05/13/2020 9:00 [place] Record Number: [id number] Patient Account Number: [id number] Date of Birth/Sex: Treating RN: 1970-12-07 (50 y.o. [name] Primary Care Efstathios Sawin: [name], [name] Other Clinician: Referring Everet Flagg: Treating Ovetta Bazzano/Extender: [name], [name] Weeks in Treatment: 20 Vital Signs Time Taken: 09:10 Temperature (F): 98.5 Height (in): 67 Pulse (bpm): 108 Weight (lbs): 125 Respiratory Rate (breaths/min): 17 Body Mass Index (BMI): 19.6 Blood Pressure (mmHg): 200/100 Capillary Blood Glucose (mg/dl): 594 Reference Range: 80 - 120 mg / dl Notes Dr. Dellia Nims notified of elevated blood sugar and BP, recommended patient go to ER for evaluation, but pt refused. Electronic Signature(s) Signed: 05/16/2020 5:50:50 PM By: [name] RN, BSN Entered By: [name] on 05/13/2020 09:20:08

## 2020-05-16 NOTE — Telephone Encounter (Signed)
Patient is scheduled today and she called to let dr Dwyane Dee know that she hasn't had her [employer] supplies in a few weeks - she has her pump, but is unable to use the supplies for it, she called [employer] and they informed her that her warranty had expired. She asked if we had any sensors in office she could use until she figures out her situation with [employer]. Suanne Marker stated she did have sensors in office, so I let patient know that after a couple weeks of using the sensors from [place], she can call back to reschedule her appointment for today.

## 2020-05-16 NOTE — Telephone Encounter (Signed)
Opened in error

## 2020-05-17 ENCOUNTER — Other Ambulatory Visit: Payer: Self-pay

## 2020-05-17 ENCOUNTER — Telehealth (HOSPITAL_COMMUNITY): Payer: Self-pay | Admitting: *Deleted

## 2020-05-17 DIAGNOSIS — R072 Precordial pain: Secondary | ICD-10-CM

## 2020-05-17 NOTE — Telephone Encounter (Signed)
Close encounter 

## 2020-05-17 NOTE — Progress Notes (Signed)
Attestation for [employer]

## 2020-05-18 ENCOUNTER — Other Ambulatory Visit: Payer: Self-pay

## 2020-05-18 ENCOUNTER — Ambulatory Visit (HOSPITAL_COMMUNITY)
Admission: RE | Admit: 2020-05-18 | Discharge: 2020-05-18 | Disposition: A | Discharge status: Home / Self Care | Payer: Medicare HMO | Source: Ambulatory Visit | Attending: Cardiovascular Disease | Admitting: Cardiovascular Disease

## 2020-05-18 DIAGNOSIS — I10 Essential (primary) hypertension: Secondary | ICD-10-CM

## 2020-05-18 DIAGNOSIS — R072 Precordial pain: Secondary | ICD-10-CM | POA: Insufficient documentation

## 2020-05-18 DIAGNOSIS — E785 Hyperlipidemia, unspecified: Secondary | ICD-10-CM | POA: Insufficient documentation

## 2020-05-18 DIAGNOSIS — E1051 Type 1 diabetes mellitus with diabetic peripheral angiopathy without gangrene: Secondary | ICD-10-CM | POA: Diagnosis not present

## 2020-05-18 LAB — MYOCARDIAL PERFUSION IMAGING
LV dias vol: 88 mL (ref 46–106)
LV sys vol: 32 mL
Peak HR: 104 {beats}/min
Rest HR: 91 {beats}/min
SDS: 0
SRS: 0
SSS: 0
TID: 1.15

## 2020-05-18 MED ORDER — AMINOPHYLLINE 25 MG/ML IV SOLN
75.0000 mg | Freq: Once | INTRAVENOUS | Status: AC
  Administered 2020-05-18: 75 mg via INTRAVENOUS

## 2020-05-18 MED ORDER — REGADENOSON 0.4 MG/5ML IV SOLN
0.4000 mg | Freq: Once | INTRAVENOUS | Status: AC
  Administered 2020-05-18: 0.4 mg via INTRAVENOUS

## 2020-05-18 MED ORDER — TECHNETIUM TC 99M TETROFOSMIN IV KIT
31.3000 | PACK | Freq: Once | INTRAVENOUS | Status: AC | PRN
  Administered 2020-05-18: 31.3 via INTRAVENOUS
  Filled 2020-05-18: qty 32

## 2020-05-18 MED ORDER — TECHNETIUM TC 99M TETROFOSMIN IV KIT
10.5000 | PACK | Freq: Once | INTRAVENOUS | Status: AC | PRN
  Administered 2020-05-18: 10.5 via INTRAVENOUS
  Filled 2020-05-18: qty 11

## 2020-06-03 ENCOUNTER — Other Ambulatory Visit: Payer: Self-pay

## 2020-06-03 ENCOUNTER — Encounter (HOSPITAL_BASED_OUTPATIENT_CLINIC_OR_DEPARTMENT_OTHER): Payer: Medicare HMO | Attending: Internal Medicine | Admitting: Physician Assistant

## 2020-06-03 DIAGNOSIS — L97411 Non-pressure chronic ulcer of right heel and midfoot limited to breakdown of skin: Secondary | ICD-10-CM | POA: Diagnosis not present

## 2020-06-03 DIAGNOSIS — H4901 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, right eye: Secondary | ICD-10-CM | POA: Diagnosis not present

## 2020-06-03 DIAGNOSIS — E10621 Type 1 diabetes mellitus with foot ulcer: Secondary | ICD-10-CM | POA: Diagnosis not present

## 2020-06-03 DIAGNOSIS — M86671 Other chronic osteomyelitis, right ankle and foot: Secondary | ICD-10-CM | POA: Insufficient documentation

## 2020-06-03 DIAGNOSIS — E1022 Type 1 diabetes mellitus with diabetic chronic kidney disease: Secondary | ICD-10-CM | POA: Diagnosis not present

## 2020-06-03 DIAGNOSIS — Z992 Dependence on renal dialysis: Secondary | ICD-10-CM | POA: Diagnosis not present

## 2020-06-03 DIAGNOSIS — N189 Chronic kidney disease, unspecified: Secondary | ICD-10-CM | POA: Insufficient documentation

## 2020-06-03 DIAGNOSIS — L97514 Non-pressure chronic ulcer of other part of right foot with necrosis of bone: Secondary | ICD-10-CM | POA: Insufficient documentation

## 2020-06-03 DIAGNOSIS — Z9641 Presence of insulin pump (external) (internal): Secondary | ICD-10-CM | POA: Diagnosis not present

## 2020-06-03 DIAGNOSIS — E1042 Type 1 diabetes mellitus with diabetic polyneuropathy: Secondary | ICD-10-CM | POA: Insufficient documentation

## 2020-06-03 NOTE — Progress Notes (Signed)
[name], [name] ([id number]) Visit Report for 06/03/2020 Chief Complaint Document Details Patient Name: Date of Service: Nancy LLO [name] 06/03/2020 12:30 PM Medical Record Number: [id number] Patient Account Number: [id number] Date of Birth/Sex: Treating RN: 1970-09-08 (50 y.o. [name] Primary Care Provider: [name], [name] Other Clinician: Referring Provider: Treating Provider/Extender: Worthy Keeler MO REIRA, [name] Weeks in Treatment: 34 Information Obtained from: Patient Chief Complaint Significant right foot ulcer with right leg ulcers Electronic Signature(s) Signed: 06/03/2020 1:03:14 PM By: Worthy Keeler PA-C Entered By: Worthy Keeler on 06/03/2020 13:03:14 -------------------------------------------------------------------------------- HPI Details Patient Name: Date of Service: Nancy LLO [name] 06/03/2020 12:30 PM Medical Record Number: [id number] Patient Account Number: [id number] Date of Birth/Sex: Treating RN: 14-Aug-1970 (50 y.o. [name] Primary Care Provider: [name], [name] Other Clinician: Referring Provider: Treating Provider/Extender: Worthy Keeler MO REIRA, [name] Weeks in Treatment: 59 History of Present Illness HPI Description: 50 year old diabetic who is known to have type 1 diabetes which is poorly controlled last hemoglobin A1c was 11%. She comes in with a ulcerated area on the left lateral foot which has been there for over 6 months. Was recently she has been treated by [name] of podiatry who saw her last on 05/28/2016. Review of his notes revealed that the patient had incision and drainage with placement of antibiotic beads to the left foot on 04/11/2016 for possible osteomyelitis of the cuboid bone. Over the last year she's had a history of amputation of the left fifth toe and a femoropopliteal popliteal bypass graft somewhere in April 2017. 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 His note Dr.  [name] mentions that the patient has been referred to me for further wound care and possibly great candidate for hyperbaric oxygen therapy due to recurrent osteomyelitis. However we do not have any x-rays of biopsy reports confirming this. He has been on several antibiotics including Bactrim and most recently is on doxycycline for an MRSA. I understand, the patient was not a candidate for IV antibiotics as she has had previous PICC lines which resulted in blood clots in both arms. There was a x-ray report dated 04/04/2016 on [name] notes which showed evidence of fifth ray resection left foot with osteolytic changes noted to the fourth metatarsal and cuboid bone on the left. 06/13/2016 -- had a left foot x-ray which showed no acute fracture or dislocation and no definite radiographic evidence of osteomyelitis. Advanced osteopenia was seen. 06/20/2016 -- she has noticed a new wound on the right plantar foot in the region where she had a callus before. 06/27/16- the patient did have her x-ray of the right foot which showed no findings to suggest osteomyelitis. She saw her endocrinologist, [name], yesterday. Her A1c in January was 11. He also indicates mismanagement and noncompliance regarding her diabetes. She is currently on Bactrim for a lip infection. She is complaining of nausea, vomiting and diarrhea. She is unable to articulate the exact orders or dosing of the Bactrim; it is unclear when she will complete this. 07/04/2016 -- results from [facility] of ABIs with ankle waveforms were noted from 02/14/2016. The examination done on 06/27/2015 showed noncompressible ABIs with the right being 1.45 and the left being 1.33. The present examination showed a right ABI of 1.19 on the left of 1.33. The conclusion was that right normal ABI in the lower extremity at rest however compared to previous study which was  noncompressible ABI may be falsely elevated side suggesting medial calcification. The left  ABI suggested medial calcification. 08/01/2016 -- the patient had more redness and pain on her right foot and did not get to come to see as noted she see her PCP or go to the ER and decided to take some leftover metronidazole which she had at home. As usual, the patient does report she feels and is rather noncompliant. 08/08/2016 -- -- x-ray of the right foot -- FINDINGS:Transmetatarsal amputation is noted. No bony destruction is noted to suggest osteomyelitis. IMPRESSION: No evidence of osteomyelitis. Postsurgical changes are seen. MRI would be more sensitive for possible bony changes. Culture has grown Serratia Marcescens -- sensitive to Bactrim, ciprofloxacin, ceftazidime she was seen by [name] on 08/06/2016. He did not find any exposed bone, muscle, tendon, ligament or joint. There was no malodor and he did a excisional debridement in the office. ============ Old notes: 50 year old patient who is known to the wound clinic for a while had been away from the wound clinic since 09/01/2014. Over the last several months she has been admitted to various hospitals including [facility]. She was treated for a right metatarsal osteomyelitis with a transmetatarsal amputation and this was done about 2 months ago. He has a small ulcerated area on the right heel and she continues to have an ulcerated area on the left plantar aspect of the foot. The patient was recently admitted to the [facility] hospital group between 7/12 and 10/18/2014. she was given 3 weeks of IV vancomycin and was to follow-up with her surgeons at [facility] and also took oral vancomycin for C. difficile colitis. 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 MRI of the left foot done on 10/14/2014 showed no abscess or osteomyelitis. 04/27/15; this is a patient we know from previous stays in the wound care center. She is a type I diabetic I am not sure of her control currently. Since the last time I saw her she is had a right transmetatarsal amputation and has no wounds on her right foot and has no open wounds. She is been followed at the wound care center at [facility] in [place]. She comes today with the desire to undergo hyperbaric treatment locally. Apparently one of her wound care providers in [place] has suggested hyperbarics. This is in response to an MRI from 04/18/15 that showed increased marrow signal and loss of the proximal fifth metatarsal cortex evidence of osteomyelitis with likely early osteomyelitis in the cuboid bone as well. She has a large wound over the base of the fifth metatarsal. She also has a eschar over her the tips of her toes on 1,3 and 5. She does not have peripheral pulses and apparently is going for an angiogram tomorrow which seems reasonable. After this she is going to infectious disease at [facility]. They have been using Medihoney to the large wound on the lateral aspect of the left foot to. The patient has known Charcot deformity from diabetic neuropathy. She also has known diabetic PAD. Surprisingly I can't see that she has had any recent antibiotics, the patient states the last antibiotic she had was at the end of November for 10 days. I think this was in response to culture that showed group G strep  although I'm not exactly sure where the culture was from. She is also had arterial studies on 03/29/15. This showed a right ABI of 1.4 that was noncompressible. Her left ABI was 0.73. 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 It was not felt that arterial inflow was adequate for healing of a foot ulcer. Her Doppler waveforms looked  monophasic ===== READMISSION 02/28/17; this is in an now 50 year old woman we've had at several different occasions in this clinic. 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 She has a remote ex-smoker. She was last seen in this clinic by Dr. Con Memos I think in May. More recently she is been followed by her podiatrist [name] an infectious disease Dr. Megan Salon. She has 2 open wounds the major one is over the right first metatarsal head she also has a wound on the left plantar foot. an MRI of the right foot on 01/01/17 showed a soft tissue ulcer along the plantar aspect of the first metatarsal base consistent with osteomyelitis of the first metatarsal stump. Dr. 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 According to the patient this is been open for slightly over a month. She has been on a combination of Cipro 500 twice a day, Zyvox 600 twice a day and Flagyl 500 3 times a day for over a month now as directed by Dr. Megan Salon. cultures of the right foot earlier this year showed MRSA in January and Serratia in May. January also had a few viridans strep. Recent x-rays of both feet were done and [name] office and I don't have these reports. The patient has known PAD and has a history of aleft femoropopliteal bypass in April 2017. 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 Dwyane Dee. In spite of this the last hemoglobin A1c I can see is 10.1 on 01/01/2017. She is being referred by 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 She is been using Medihoney to this area and also an area on the left midfoot. She is using healing sandals bilaterally. ABIs in  this clinic at the left posterior tibial was 1.1 noncompressible on the right READMISSION Non invasive vascular NOVANT 5/18 Aftercare following surgery of the circulatory system Procedure Note - Interface, External Ris In - 08/13/2016 11:05 AM EDT Procedure: Examination consists of physiologic resting arterial pressures of the brachial and ankle arteries bilaterally with continuous wave Doppler waveform analysis. Previous: Previous exam performed on 02/14/16 demonstrated ABIs of Rt = 1.19 and Lt = 1.33. Right: ABI = non-compressible PT 1.47 DP. S/P transmet amputation. , Left: ABI = 1.52, 2nd digit pressure = 87 mmHg Conclusions: Right: ABI (>1.3) may be falsely elevated, suggesting medial calcification. Left: ABI (>1.3) may be falsely elevated, suggesting medial calcification The patient is a now 50 year old type I diabetic is had multiple issues her graded to chronic diabetic foot ulcers. 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 We had her in the clinic lastin November. At that point she had wounds on her bilateral feet.she had wanted to try hyperbarics however the healogics review process denied her because she hadn't followed up with her vascular  surgeon for her left femoropopliteal bypass. The bypass was done by Dr. Raul Del at [facility]. We made her a follow-up with Dr. Raul Del however she did not keep the appointment and therefore she was not approved The patient shows me a small wound on her left fourth metatarsal head on her phone. She developed rapid discoloration in the plantar aspect of the left foot and she was admitted to hospital from 2/2 through 05/10/17 with wet gangrene of the left foot osteomyelitis of the fourth metatarsal heads. She was admitted acutely ill with a temperature of 103. She was started on broad-spectrum vancomycin and cefepime. On 05/06/17 she was taken to the OR by [name]  her podiatric surgeon for an incision and drainage irrigation of the left foot wound. Cultures from this surgery revealed group be strep and anaerobes. she was seen by Dr.Xu of orthopedic surgery and scheduled for a below-knee amputation which she u refused. Ultimately she was discharged on Levaquin and Flagyl for one month. MRI 05/05/17 done while she was in the hospital showed abscess adjacent to the fourth metatarsal head and neck small abscess around the fourth flexor tendon. Inflammatory phlegmon and gas in the soft tissues along the lateral aspect of the fourth phalanx. Findings worrisome for osteomyelitis involving the fourth proximal and middle phalanx and also the third and fourth metatarsals. Finally the patient had actually shortly before this followed up with Dr. Raul Del at no time on 04/29/17. He felt that her left femoropopliteal bypass was patent he felt that her left-sided toe pressures more than adequate for healing a wound on the left foot. This was before her acute presentation. 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 05/28/17; she is started hyperbarics. The patient tells me that for some reason she was not actually on Levaquin but I think on ciprofloxacin. She was on Flagyl. She only started her Levaquin yesterday due to some difficulty with the pharmacy and perhaps her sister picking it up. She has an appointment with [name] tomorrow and with infectious disease early next week. She has no new complaints 06/06/17; the patient continues in hyperbarics. She saw [name] on 05/29/17 who is her podiatric surgeon. He is elected for a transmetatarsal amputation on 06/27/17. I'm not sure at what level he plans to do this amputation. The patient is unaware She also saw Dr. Megan Salon of infectious disease who elected to continue her on current antibiotics I think this is ciprofloxacin and Flagyl. I'll need to clarify with her tomorrow if she actually has this. We're using silver alginate  to the actual wound. Necrotic surface today with material under the flap of her foot. Original MRI showed abscesses as well as osteomyelitis of the proximal and middle fourth phalanx and the third and fourth metatarsal heads 06/11/17; patient continues in hyperbarics and continues on oral antibiotics. She is doing well. The wound looks better. The necrotic part of this under the flap in her superior foot also looks better. she is been to see [name]. I haven't had a chance to look at his note. Apparently he has put the transmetatarsal amputation on hold her request it is still planning to take her to the OR for debridement and product application ACEL. I'll see if I can find his note. I'll therefore leave product ordering/requests to [name] for now. I was going to look at Dermagraft 06/18/17-she is here in follow-up evaluation for bilateral foot wounds. She continues with hyperbaric therapy. She states she has been applying manuka honey to  the right plantar foot and alternate manuka honey and silver alginate to the left foot, despite our orders. We will continue with same treatment plan and she will follo up next week. 06/25/17; I have reviewed [name] last note from 3/11. She has operative debridement in 2 days' time. By review his note apparently they're going to place there is skin over the majority of this wound which is a good choice. She has a small satellite area at the most proximal part of this wound on the left plantar foot. The area on the right plantar foot we've been using silver alginate and it is close to healing. 07/02/17; unfortunately the patient was not easily approved for [name] proposed surgery. I'm not completely certain what the issue is. She has been using silver alginate to the wound she has completed a first course of hyperbarics. She is still on Levaquin and Flagyl. I have really lost track of the time course here.I suspect she should have another week to 2 of  antibiotics. I'll need to see if she is followed up with infectious disease Dr. Megan Salon 07/09/17; the patient is followed up with Dr. Megan Salon. 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 She continues on Levaquin and metronidazole continuing both of these for now I think she is been on fr about 6 weeks. She still has some drainage but no pain. No fever. Her had been plans for her to go to the OR for operative debridement with her podiatrist [name], I am not exactly sure where that is. I'll probably slip a note to [name] today. I note that she follows with Dr. Dwyane Dee of endocrinology. We have her recertified for hyperbaric oxygen. I have not heard about Dermagraft however I'll see if [name] is planning a skin substitute as well 07/16/17; the patient tells me she is just about out of [place]. I'll need to check Dr. Hale Bogus last notes on this. She states she has plenty of Flagyl however. She comes in today complaining of pain in the right lateral foot which she said lasted for about a day. The wound on the right foot is actually much more medially. She also tells me that the [facility] cost a lot of pain in the left foot wound and she turned back to silver alginate. Finally Dermagraft has a $681 per application co-pay. She cannot afford this 07/23/17; patient arrives today with the wound not much smaller. There is not much new to add. She has not heard from [name] all try to put in a call to them today. She was asking about Dermagraft again and she has an over $275 per application co-pay she states that she would be willing to try to do a payment plan. I been tried to avoid this. We've been using silver alginate, I'll change to [facility] 07/30/17-She is here in follow-up evaluation for left foot ulcer. She continues hyperbaric medicine. The left foot ulcer is stable we will continue with same treatment plan 08/06/17; she is here for evaluation  of her left foot ulcer. Currently being treated for hyperbarics or underlying osteomyelitis. She is completed antibiotics. The left foot ulcer is better smaller with healthier looking granulation. For various reasons I am not really clear on we never got her back to the OR with [name]. He did not respond to my secure text message. Nevertheless I think that surgery on this point is not necessary nor am  I completely clear that a skin substitute is necessary The patient is complaining about pain on the outside of her right foot. 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 There is no erythema. She also states the foot is warm versus her other part of her upper leg and this is largely true. It is not totally clear to me what's causing this. She thinks it's different from her usual neuropathy pain 08/13/17; she arrives in clinic today with a small wound which is superficial on her right first metatarsal head. 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 She tells [place] she was up on her feet over the Mother's Day celebration. The large wound is on the left foot. Continues with hyperbarics for underlying osteomyelitis. We're using Hydrofera Blue. She asked me today about where we were with Dermagraft. I had actually excluded this because of the co-pay however she wants to assume this therefore I'll recheck the co-pay an order for next week. 08/20/17; the patient agreed to accept the co-pay of the first Apligraf which we applied today. She is disappointed she is finishing hyperbarics will run this through the insurance on the extent of the foot infection and the extent of the wound that she had however she is already had 60 dive's. Dermagraft No. 1 08/27/17; Dermagraft No. 2. She is not eligible for any more hyperbaric treatments this month. She reports a fair amount of drainage and she actually changed to the external dressings without disturbing the direct contact layer 09/03/17;  the patient arrived in clinic today with the wound superficially looking quite healthy. Nice vibrant red tissue with some advancing epithelialization although not as much adherence of the flap as I might like. However she noted on her own fourth toe some bogginess and she brought that to our attention. Indeed this was boggy feeling like a possibility of subcutaneous fluid. 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 She is not been unwell. We've been using Dermagraft 09/10/17; the culture that I did not last week was MRSA. She saw Dr. Megan Salon this morning who is going to start her on vancomycin. I had sent him a secure a text message yesterday. I also spoke with her podiatric surgeon [name] about surgery on this foot the options for conserving a functional foot etc. Promised me he would see her and will make back consultation today. Paradoxically her actual wound on the plantar aspect of her left foot looks really quite good. I had given her 5 days worth of Baxdella to cover her for MRSA. Her MRI came back showing osteomyelitis within the third metatarsal shaft and head and base of the third and fourth proximal phalanx. She had extensive inflammatory changes throughout the soft tissue of the lateral forefoot. With an ill-defined fluid around the fourth metatarsal extending into the plantar and dorsal soft tissues 09/19/17; the patient is actually on oral Septra and Flagyl. She apparently refused IV vancomycin. She also saw [name] at my request who is planning her for a left BKA sometime in mid July. MRI showed osteomyelitis within the third metatarsal shaft and head and the basis of the third and fourth proximal phalanx. I believe there was felt to be possible septic arthritis involving the third MTP. 09/26/17; the patient went back to Dr. Megan Salon at my suggestion and is now receiving IV daptomycin. Her wound continues to look  quite good making the decision  to proceed with a transmetatarsal amputation although more difficult for the patient. I believe in my extensive discussions with her she has a good sense of the pros and cons of this. I don't NV the tuft decision she has to make. She has an appointment with [name] I believe in mid July and I previously spoken to him about this issue Has we had used 3 previous Dermagraft. Given the condition of the wound surface I went ahead and added the fourth one today area and I did this not fully realizing that she'll be traveling to [place] next week. I'm hopeful she can come back in 2 weeks 10/21/17; Her same Dermagraft on for about 3-1/2 weeks. In spite of this the wound arrives looking quite healthy. There is been a lot of healing dimensions are smaller. Looking at the square shaped wound she has now there is some undermining and some depth medially under the undermining although I cannot palpate any bone. No surrounding infection is obvious. She has difficult questions about how to look at this going forward vis--vis amputations versus continued medical therapy. T be truthful the wound is looks o so healthy and it is continued to contract. Hard to justify foot surgery at this point although I still told her that I think it might come to that if we are not able to eradicate the underlying MRSA. She is still highly at risk and she understands this 11/06/17 on evaluation today patient appears to be doing better in regard to her foot ulcer. She's been tolerating the dressing changes without complication. Currently she is here for her Dermagraft #6. Her wound continues to make excellent progress at this point. She does not appear to have any evidence of infection which is good news. 11/13/17 on evaluation today patient appears to be doing excellent at this time. She is here for repeat Dermagraft application. This is #7. Overall her wound seems to be making great progress. 12/05/17;  the patient arrives with the wound in much better condition than when I last saw this almost 6 weeks ago. She still has a small probing area in the left metatarsal head region on the lateral aspect of her foot. We applied her last Dermagraft today. Since the last time she is here she has what appears to a been a blood blister on the plantar aspect of left foot although I don't see this is threatening. There is also a thick raised tissue on the right mid metatarsal head region. This was not there I don't think the last time she was here 3 weeks ago. 12/12/17; the patient continues to have a small programming area in the left metatarsal head region on the lateral aspect of her foot which was the initial large surgical wound. I applied her last Apligraf last week. I'm going to use Endoform starting today Unfortunately she has an excoriated area in the left mid foot and the right mid foot. The left midfoot looks like a blistered area this was not opened last week it certainly is open today. Using silver alginate on these areas. She promises me she is offloading this. 12/19/17; the small probing area in the left metatarsal head eyes think is shallower. In general her original wound looks better. We've been using Endoform. The area inferiorly that I think was trauma last week still requires debridement a lot of nonviable surface which I removed. She still has an open open area distally in her foot Similarly on the right foot there is tightly  adherent surface debris which I removed. Still areas that don't look completely epithelialized. This is a small open area. We used silver alginate on these areas 12/26/2017; the patient did not have the supplies we ordered from last week including the Endoform. The original large wound on the left lateral foot looks healthy. She still has the undermining area that is largely unchanged from last week. She has the same heavily callused raised edged wounds on the right  mid and left midfoot. Both of these requiring debridement. We have been using silver alginate on these areas 01/02/2018; there is still supply issues. We are going to try to use Prisma but I am not sure she actually got it from what she is saying. She has a new open area on the lateral aspect of the left fourth toe [previous fifth ray amputation]. Still the one tunneling area over the fourth metatarsal head. The area is in the midfoot bilaterally still have thick callus around them. She is concerned about a raised swelling on the lateral aspect of the foot. However she is completely insensate 01/10/2018; we are using Prisma to the wounds on her bilateral feet. Surprisingly the tunneling area over the left fourth metatarsal head that was part of her original surgery has closed down. She has a small open area remaining on the incision line. 2 open areas in the midfoot. 02/10/2018; the patient arrives back in clinic after a month hiatus. She was traveling to visit family in [place]. Is fairly clear she was not offloading the areas on her feet. The original wound over the left lateral foot at the level of metatarsal heads is reopened and probes medially by about a centimeter or 2. She notes that a week ago she had purulent drainage come out of an area on the left midfoot. Paradoxically the worst area is actually on the right foot is extensive with purulent drainage. We will use silver alginate today 02/17/2018; the patient has 3 wounds one over the left lateral foot. She still has a small area over the metatarsal heads which is the remnant of her original surgical wound. This has medial probing depth of roughly 1.4 cm somewhat better than last week. The area on the right foot is larger. We have been using silver alginate to all areas. The area on the right foot and left foot that we cultured last week showed both Klebsiella and Proteus. Both of these are quinolone sensitive. The patient put her's self on  Bactrim and Flagyl that she had left hanging around from prior antibiotic usages. She was apparently on this last week when she arrived. I did not realize this. Unfortunately the Bactrim will not cover either 1 of these organisms. We will send in Cipro 500 twice daily for a week 03/04/2018; the patient has 2 wounds on the left foot one is the original wound which was a surgical wound for a deep DFU. At one point this had exposed bone. She still has an area over the fourth metatarsal head that probes about 1.4 cm although I think this is better than last week. I been using silver nitrate to try and promote tissue adherence and been using silver alginate here. She also has an area in the left midfoot. This has some depth but a small linear wound. Still requiring debridement. On the right midfoot is a circular wound. A lot of thick callus around this area. We have been using silver alginate to all wound areas She is completed the ciprofloxacin I  gave her 2 weeks ago. 03/11/2018; the patient continues to have 2 open areas on the left foot 1 of which was the original surgical wound for a deep DFU. Only a small probing area remains although this is not much different from last week we have been using silver alginate. The other area is on the midfoot this is smaller linear but still with some depth. We have been using silver alginate here as well On the right foot she has a small circular wound in the mid aspect. This is not much smaller than last time. We have been using silver alginate here as well 03/18/2018; she has 3 wounds on the left foot the original surgical wound, a very superficial wound in the mid aspect and then finally the area in the mid plantar foot. She arrives in today with a very concerning area in the wound in the mid plantar foot which is her most proximal wound. There is undermining here of roughly 1-1/2 cm superiorly. Serosanguineous drainage. She tells me she had some pain on for over  the weekend that shot up her foot into her thigh and she tells me that she had a nodule in the groin area. She has the single wound in the right foot. We are using endoform to both wound areas 03/24/2018; the patient arrives with the original surgical wound in the area on the left midfoot about the same as last week. There is a collection of fluid under the surface of the skin extending from the surgical wound towards the midfoot although it does not reach the midfoot wound. The area on the right foot is about the same. Cultures from last week of the left midfoot wound showed abundant Klebsiella abundant Enterococcus faecalis and moderate methicillin resistant staph I gave her Levaquin but this would have only covered the Klebsiella. She will need linezolid 04/01/2018; she is taking linezolid but for the first few days only took 1 a day. I have advised her to finish this at twice daily dosing. In any case all of her wounds are a lot better especially on the left foot. The original surgical wound is closed. The area on the left midfoot considerably smaller. The area on the right foot also smaller. 04/08/2018; her original surgical wound/osteomyelitis on the left foot remains closed. She has area on the left foot that is in the midfoot area but she had some streaking towards this. This is not connected with her original wound at least not visually. Small wound on the right midfoot appears somewhat smaller. 04/15/18; both wounds looks better. Original wound is better left midfoot. Using silver alginate 1/21; patient states she uses saltwater soak in, stones or remove callus from around her wounds. She is also concerned about a blood blister she had on the left foot but it simply resolved on its own. We've been using silver alginate 1/28; the patient arrives today with the same streaking area from her metatarsals laterally [the site of her original surgical wound] down to the middle of her foot. There is  some drainage in the subcutaneous area here. This concerns me that there is actually continued ongoing infection in the metatarsals probably the fourth and third. This fixates an MRI of the foot without contrast [chronic renal failure] The wound in the mid part of the foot is small but I wonder whether this area actually connects with the more distal foot. The area on the right midfoot is probably about the same. Callus thick skin around the  small wound which I removed with a curette we have been using silver alginate on both wound areas 2/4; culture I did of the draining site on the left foot last time grew methicillin sensitive staph aureus. MRI of the left foot showed interval resolution of the findings surrounding the third metatarsal joint on the prior study consistent with treated osteomyelitis. Chronic soft tissue ulceration in the plantar and lateral aspect of the forefoot without residual focal fluid collection. No evidence of recurrent osteomyelitis. 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 She tells me she had nausea and vomiting over the weekend and missed 2 days. In general her foot looks much the same. She has a small open area just below the left fourth metatarsal head. A linear area in the left midfoot. Some discoloration extending from the inferior part of this into the left lateral foot although this appears to be superficial. She has a small area on the right midfoot which generally looks smaller after debridement 2/18; the patient is completing his cephalexin and has another 2 days. She continues to have open areas on the left and right foot. 2/25; she is now off antibiotics. The area on the left foot at the site of her original surgical  wound has closed yet again. She still has open areas in the mid part of her foot however these appear smaller. The area on the right mid foot looks about the same. We have been using silver alginate She tells me she had a serious hypoglycemic spell at home. She had to have EMS called and get IV dextrose 3/3; disappointing on the left lateral foot large area of necrotic tissue surrounding the linear area. This appears to track up towards the same original surgical wound. Required extensive debridement. The area on the right plantar foot is not a lot better also using silver 3/12; the culture I did last time showed abundant enterococcus. I have prescribed Augmentin, should cover any unrecognized anaerobes as well. In addition there were a few MRSA and Serratia that would not be well covered although I did not want to give her multiple antibiotics. She comes in today with a new wound in the right midfoot this is not connected with the original wound over her MTP a lot of thick callus tissue around both wounds but once again she said she is not walking on these areas 3/17-Patient comes in for follow-up on the bilateral plantar wounds, the right midfoot and the left plantar wound. Both these are heavily callused surrounding the wounds. We are continuing to use silver alginate, she is compliant with offloading and states she uses a wheelchair fairly often at home 3/24; both wound areas have thick callus. However things actually look quite a bit better here for the majority of her left foot and the right foot. 3/31; patient continues to have thick callused somewhat irritated looking tissue around the wounds which individually are fairly superficial. There is no evidence of surrounding infection. We have been using silver alginate however I change that to [facility] today 4/17; patient returns to clinic after having a scare with Covid she tested negative in her primary doctor's office. She has been using  Hydrofera Blue. She does not have an open area on the right  foot. On the left foot she has a small open area with the mid area not completely viable. She showed me pictures of what looks like a hemorrhagic blister from several days ago but that seems to have healed over this was on the lateral left foot 4/21; patient comes in to clinic with both her wounds on her feet closed. However over the weekend she started having pain in her right foot and leg up into the thigh. She felt as though she was running a low-grade fever but did not take her temperature. She took a doxycycline that she had leftover and yesterday a single Septra and metronidazole. She thinks things feel somewhat better. 4/28; duplex ultrasound I ordered last week was negative for DVT or superficial thrombophlebitis. She is completed the doxycycline I gave her. States she is still having a lot of pain in the right calf and right ankle which is no better than last week. She cannot sleep. She also states she has a temperature of up to 101, coughing and complaining of visual loss in her bilateral eyes. Apparently she was tested for Covid 2 weeks ago at [facility] and that was negative. Readmission: 09/03/18 patient presents back for reevaluation after having been evaluated at the end of April regarding erythema and swelling of her right lower extremity. Subsequently she ended up going to the hospital on 07/29/18 and was admitted not to be discharged until 08/08/18. Unfortunately it was noted during the time that she was in the hospital that she did have methicillin-resistant Staphylococcus aureus as the infection noted at the site. It was also determined that she did have osteomyelitis which appears to be fairly significant. She was treated with vancomycin and in fact is still on IV vancomycin at dialysis currently. This is actually slated to continue until 09/12/18 at least which will be the completion of the six weeks of therapy. Nonetheless based  on what I'm seeing at this point I'm not sure she will be anywhere near ready to discontinue antibiotics at that time. Since she was released from the hospital she was seen by [name] who is her podiatrist on 08/27/18. His note specifically states that he is recommended that the patient needs of one knee amputation on the right as she has a life- threatening situation that can lead quickly to sepsis. The patient advised she would like to try to save her leg to which [name] apparently told her that this was against all medical advice. She also want to discontinue the Wound VAC which had been initiated due to the fact that she wasn't pleased with how the wound was looking and subsequently she wanted to pursue applying Medihoney at that time. He stated that he did not believe that the right lower extremity was salvageable and that the patient understood but would still like to attempt hyperbaric option therapy if it could be of any benefit. She was therefore referred back to [place] for further evaluation. He plans to see her back next week. Upon inspection today patient has a significant amount purulent drainage noted from the wound at this point. The bone in the distal portion of her foot also appears to be extremely necrotic and spongy. When I push down on the bone it bubbles and seeps purulent drainage from deeper in the end of the foot. I do not think that this is likely going to heal very well at all and less aggressive surgical debridement were undertaken more than what I believe we can likely  do here in our office. 09/12/2018; I have not seen this patient since the most recent hospitalization although she was in our clinic last week. I have reviewed some of her records from a complex hospitalization. 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 There is situation was complicated by MRSA bacteremia and acute on chronic renal failure now on dialysis. She is  receiving vancomycin at dialysis. We started her on Dakin's wet-to-dry last week she is changing this daily. There is still purulent drainage coming out of her foot. Although she is apparently "agreeable" to a below-knee amputation which is been suggested by multiple clinicians she wants this to be done in [place]. She apparently has a telehealth visit with that provider sometime in late [place] 6/24. I have told her I think this is probably too long. Nevertheless I could not convince her to allow a local doctor to perform BKA. 09/19/2018; the patient has a large necrotic area on the right anterior foot. She has had previous transmetatarsal amputations. Culture I did last week showed MRSA nothing else she is on vancomycin at dialysis. She has continued leaking purulent drainage out of the distal part of the large circular wound on the right anterior foot. She apparently went to see Dr. 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 Somehow that translated into her being referred to plastic surgery for debridement of the area. I gather she basically refused amputation although I do not have a copy of Dr. Berenice Primas notes. The patient really wants to have a trial of hyperbaric oxygen. I agreed with initial assessment in this clinic that this was probably too far along to benefit however if she is going to have plastic surgery I think she would benefit from ancillary hyperbaric oxygen. The issue here is that the patient has benefited as maximally as any patient I have ever seen from hyperbaric oxygen therapy. Most recently she had exposed bone on the lateral part of her left foot after a surgical procedure and that actually has closed. She has eschared areas in both heels but no open area. She is remained systemically well. I am not optimistic that anything can be done about this but the patient is very clear that she wants an attempt. The attempt would include a wound VAC further  debridements and hyperbaric oxygen along with IV antibiotics. 6/26; I put her in for a trial of hyperbaric oxygen only because of the dramatic response she has had with wounds on her left midfoot earlier this year which was a surgical wound that went straight to her bone over the metatarsal heads and also remotely the left third toe. We will see if we can get this through our review process and insurance. She arrives in clinic with again purulent material pouring out of necrotic bone on the top of the foot distally. There is also some concerning erythema on the front of the leg that we marked. It is bit difficult to tell how tender this is because of neuropathy. I note from infectious disease that she had her vancomycin extended. 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 She had the wound VAC on for part of the week. The rest of the time she is putting various things on this including Medihoney, "ionized water" silver sorb gel etc. 7/7; follow-up along with HBO. She is still on vancomycin at dialysis. She has a large open area on the dorsal right foot  and a small dark eschar area on her heel. There is a lot less erythema in the area and a lot less tenderness. From an infection point of view I think this is better. She still has a lot of necrosis in the remaining right forefoot [previous TMA] we are still using the wound VAC in this area 7/16; follow-up along with HBO. I put her on linezolid after she finished her vancomycin. We started this last Friday I gave her 2 weeks worth. I had the expectation that she would be operatively debrided by Dr. Marla Roe but that still has not happened yet. Patient phoned the office this week. She arrives for review today after HBO. The distal part of this wound is completely necrotic. Nonviable pieces of tendon bone was still purulent drainage. Also concerning that she has black eschar over the heel that is expanding. I think this may  be indicative of infection in this area as well. She has less erythema and warmth in the ankle and calf but still an abnormal exam 7/21 follow-up along with HBO. I will renew her linezolid after checking a CBC with differential monitoring her blood counts especially her platelets. She was supposed to have surgery yesterday but if I am reading things correctly this was canceled after her blood sugar was found to be over 500. I thought Dr. Marla Roe who called me said that they were sending her to the ER but the patient states that was not the case. 7/28. Follow-up along with HBO. She is on linezolid I still do not have any lab work from dialysis even though I called last week. The patient is concerned about an area on her left lateral foot about the level of the base of her fifth metatarsal. I did not really see anything that ominous here however this patient is in [place] ability to point out problems that she is sensing and she has been accurate in the past Finally she received a call from Dr. Marla Roe who is referring her to another orthopedic surgeon stating that she is too booked up to take her to the operating room now. Was still using a wound VAC on the foot 8/3 -Follow-up after HBO, she is got another week of linezolid, she is to call ID for an appointment, x-rays of both feet were reviewed, the left foot x-ray with third MTP joint osteo- Right foot x-ray widespread osteo-in the right midfoot Right ankle x-ray does not show any active evidence of infection 8/11-Patient is seen after HBO, the wounds on the right foot appear to be about the same, the heel wound had some necrotic base over tendon that was debrided with a curette 8/21; patient is seen after HBO. The patient's wound on her dorsal foot actually looks reasonably good and there is substantial amount of epithelialization however the open area distally still has a lot of necrotic debris partially bone. I cannot really get a good  sense of just how deep this probes under the foot. She has been pressuring me this week to order medical maggots through a company in [place] for her. The problem I have is there is not a defined wound area here. On the positive side there is no purulence. She has been to see infectious disease she is still on Septra DS although I have not had a chance to review their notes 8/28; patient is seen in conjunction with HBO. The wounds on her foot continued to improve including the right dorsal foot substantially the, the distal  part of this wound and the area on the right heel. We have been using a wound VAC over this chronically. She is still on trimethoprim as directed by infectious disease 9/4; patient is seen in conjunction with HBO. Right dorsal foot wound substantially anteriorly is better however she continues to have a deep wound in the distal part of this that is not responding. We have been using silver collagen under border foam Area on the right plantar medial heel seems better. We have been using Hydrofera Blue 12/12/18 on evaluation today patient appears to be doing about the same with regard to her wound based on prior measurements. She does have some necrotic tissue noted on the lateral aspect of the wound that is going require a little bit of sharp debridement today. This includes what appears to be potentially either severely necrotic bone or tendon. Nonetheless other than that she does not appear to have any severe infection which is good news 9/18; it is been 2 weeks since I saw this wound. She is tolerating HBO well. Continued dramatic improvement in the area on the right dorsal foot. She still has a small wound on the heel that we have been using Hydrofera Blue. She continues with a wound VAC 9/24; patient has to be seen emergently today with a swelling on her right lateral lower leg. She says that she told Dr. Evette Doffing about this and also myself on a couple of occasions but I  really have no recollection of this. She is not systemically unwell and her wound really looked good the last time I saw this. She showed this to providers at dialysis and she was able to verify that she was started on cephalexin today for 5 doses at dialysis. She dialyzes on Tuesday Thursday and Saturday. 10/2; patient is seen in conjunction with HBO. The area that is draining on the right anterior medial tibia is more extensive. Copious amounts of serosanguineous drainage with some purulence. We are still using the wound VAC on the original wound then it is stable. Culture I did of the original IandD showed MRSA I contacted dialysis she is now on vancomycin with dialysis treatments. I asked them to run a month 10/9; patient seen in conjunction with HBO. She had a new spontaneous open area just above the wound on the right medial tibia ankle. More swelling on the right medial tibia. Her wound on the foot looks about the same perhaps slightly better. There is no warmth spreading up her leg but no obvious erythema. her MRI of the foot and ankle and distal tib-fib is not booked for next Friday I discussed this with her in great detail over multiple days. it is likely she has spreading infection upper leg at least involving the distal 25% above the ankle. She knows that if I refer her to orthopedics for infectious disease they are going to recommend amputation and indeed I am not against this myself. We had a good trial at trying to heal the foot which is what she wanted along with antibiotics debridement and HBO however she clearly has spreading infection [probably staph aureus/MRSA]. Nevertheless she once again tells me she wants to wait the left of the MRI. She still makes comments about having her amputation done in [place]. 10/19; arrives today with significant swelling on the lateral right leg. Last culture I did showed Klebsiella. Multidrug-resistant. Cipro was intermediate sensitivity and  that is what I have her on pending her MRI which apparently is going to  be done on Thursday this week although this seems to be moving back and forth. She is not systemically unwell. We are using silver alginate on her major wound area on the right medial foot and the draining areas on the right lateral lower leg 10/26; MRI showed extensive abscess in the anterior compartment of the right leg also widespread osteomyelitis involving osseous structures of the midfoot and portions of the hindfoot. Also suspicion for osteomyelitis anterior aspect of the distal medial malleolus. Culture I did of the purulence once again showed a multidrug-resistant Klebsiella. I have been in contact with nephrology late last week and she has been started on cefepime at dialysis to replace the vancomycin We sent a copy of her MRI report to Dr. Geroge Baseman in [place] who is an orthopedic surgeon. The patient takes great stock in his opinion on this. She says she will go to [place] to have her leg amputated if Dr. Geroge Baseman does not feel there is any salvage options. 11/2; she still is not talk to her orthopedic surgeon in [place]. Apparently he will call her at [phone] this afternoon. The quality of this is she has not allowed me to refer her anywhere. She has been told over and over that she needs this amputated but has not agreed to be referred. She tells me her blood sugar was 600 last night but she has not been febrile. 11/9; she never did got a call from the orthopedic surgeon in [place] therefore that is off the radar. We have arranged to get her see orthopedic surgery at [facility]. She still has a lot of draining purulence coming out of the new abscess in her right leg although that probably came from the osteomyelitis in her right foot and heel. Meanwhile the original wound on the right foot looks very healthy. Continued improvement. The issue is that the last MRI showed osteomyelitis in her right foot extensively  she now has an abscess in the right anterior lower leg. There is nobody in [place] who will offer this woman anything but an amputation and to be honest that is probably what she needs. I think she still wants to talk about limb salvage although at this point I just do not see that. She has completed her vancomycin at dialysis which was for the original staph aureus she is still on cefepime for the more recent Klebsiella. She has had a long course of both of these antibiotics which should have benefited the osteomyelitis on the right foot as well as the abscess. 11/16; apparently [place] elective surgery is shut down because of COVID-19 pandemic. I have reached out to some contacts at [facility] to see if we can get her an orthopedic appointment there. I am concerned about continually leaving this but for the moment everything is static. In fact her original large wound on this foot is closing down. It is the abscess on the right anterior leg that continues to drain purulent serosanguineous material. She is not currently on any antibiotics however she had a prolonged course of vancomycin [1 month] as well as cefepime for a month 02/24/2019 on evaluation today patient appears to be doing better than the last time I saw her. This is not a patient that I typically see. With that being said I am covering for Dr. Dellia Nims this week and again compared to when I last saw her overall the wounds in particular seem to be doing significantly better which is good news. With that being said the patient tells  me several disconcerting things. She has not been able to get in to see anyone for potential debridement in regard to her leg wounds although she tells me that she does not think it is necessary any longer because she is taking care of that herself. She noticed a string coming out of the lower wound on her leg over the last week. The patient states that she subsequently decided that we must of pack something  in there and started pulling the string out and as it kept coming and coming she realized this was likely her tendon. With that being said she continued to remove as much of this as she could. She then I subsequently proceeded to using tubes of antibiotic ointment which she will stick down into the wound and then scored as much as she can until she sees it coming out of the other wound opening. She states that in doing this she is actually made things better and there is less redness and irritation. With regard to her foot wound she does have some necrotic tendon and tissue noted in one small corner but again the actual wound itself seems to be doing better with good granulation in general compared to my last evaluation. 12/7; continued improvement in the wound on the substantial part of the right medial foot. Still a necrotic area inferiorly that required debridement but the rest of this looks very healthy and is contracting. She has 2 wounds on the right lateral leg which were her original drainage sites from her abscess but all of this looks a lot better as well. She has been using silver alginate after putting antibiotic biotic ointment in one wound and watching it come out the other. I have talked to her in some detail today. I had given her names of orthopedic surgeons at [facility] for second opinion on what to do about the right leg. I do not think the patient never called them. She has not been able to get a hold of the orthopedic surgeon in [place] that she had put a lot of faith in as being somebody would give her an opinion that she would trust. I talked to her today and said even if I could get her in to another orthopedic surgeon about the leg which she accept an amputation and she said she would not therefore I am not going to press this issue for the moment 12/14; continued improvement in his substantial wound on the right medial foot. There is still a necrotic area inferiorly with  tightly adherent necrotic debris which I have been working on debriding each time she is here. She does not have an orthopedic appointment. Since last time she was here I looked over her cultures which were essentially MRSA on the foot wound and gram-negative rods in the abscess on the anterior leg. 12/21; continued improvement in the area on the right medial foot. She is not up on this much and that is probably a good thing since I do not know it could support continuous ambulation. She has a small area on the right lateral leg which were remanence of the IandD's I did because of the abscess. I think she should probably have prophylactic antibiotics I am going to have to look this over to see if we can make an intelligent decision here. In the meantime her major wound is come down nicely. Necrotic area inferiorly is still there but looks a lot better 04/06/2019; she has had some improvement in the overall  surface area on the right medial foot somewhat narrowedr both but somewhat longer. The areas on the right lateral leg which were initial IandD sites are superficial. Nothing is present on the right heel. We are using silver alginate to the wound areas 1/18; right medial foot somewhat smaller. Still a deep probing area in the most distal recess of the wound. She has nothing open on the right leg. She has a new wound on the plantar aspect of her left fourth toe which may have come from just pulling skin. The patient using Medihoney on the wound on her foot under silver alginate. I cannot discourage her from this 2/1; 2-week follow-up using silver alginate on the right foot and her left fourth toe. The area on the right dorsal foot is contracted although there is still the deep area in the most distal part of the wound but still has some probing depth. No overt infection 2/15; 2-week follow-up. She continues to have improvement in the surface area on the dorsal right foot. Even the tunneling area from  last time is almost closed. The area that was on the plantar part of her left fourth toe over the PIP is indeed closed 3/1; 2-week follow-up. Continued improvement in surface area. The original divot that we have been debriding inferiorly I think has full epithelialization although the epithelialization is gone down into the wound with probably 4 mm of depth. Even under intense illumination I am unable to see anything open here. The remanence of the wound in this area actually look quite healthy. We have been using silver alginate 3/15; 2-week follow-up. Unfortunately not as good today. She has a comma shaped wound on the dorsal foot however the upper part of this is larger. Under illumination debris on the surface She also tells [place] that she was on her right leg 2 times in the last couple of weeks mostly to reach up for things above her head etc. She felt a sharp pain in the right leg which she thinks is somewhere from the ankle to the knee. The patient has neuropathy and is really uncertain. She cannot feel her foot so she does not think it was coming from there 3/29; 2-week follow-up. Her wound measures smaller. Surface of the wound appears reasonable. She is using silver alginate with underlying Medihoney. She has home health. X-rays I did of her tib-fib last time were negative although it did show arterial calcification 4/12; 2-week follow-up. Her wound measures smaller in length. Using manuka honey with silver alginate on top. She has home health. 4/26; 2-week follow-up. Her wound is smaller but still very adherent debris under illumination requiring debridement she has been using manuka honey with silver alginate. 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 Been using Hydrofera Blue. She is on suppressive Bactrim but apparently she has been taking it twice daily 6/7; I have not seen this  wound and about 6 weeks. Since then she was up in [place]. By her own admission she was walking on the foot because she did not have a wheelchair. The wound is not nearly as healthy looking as it was the last time I saw this. We ordered different things for her but she only uses Medihoney and silver alginate. As far as I know she is on suppressive trimethoprim sulfamethoxazole. She does not admit to any fever or chills. Her CBGs apparently  are at baseline however she is saying that she feels some discomfort on the lateral part of her ankle I looked over her last inflammatory markers from the summer 2020 at which time she had a deeply necrotic infected wound in this area. On 11/10/2018 her sedimentation rate was 56 and C-reactive protein 9.9. This was 107 and 29 on 07/29/2018. 6/17; the patient had a necrotic wound the last time she was here on the right dorsal foot. After debridement I did a culture. This showed a very resistant ESBL Klebsiella as well as Enterococcus. Her x-ray of the foot which was done because of warmth and some discomfort showed bone destruction within the carpal bones involving the navicular acute cuboid lateral middle cuneiforms but essentially unchanged from her prior study which was done on 10/29/2018. The findings were felt to represent chronic osteomyelitis. We did inflammatory markers on her. Her white count was 5.25 sedimentation rate 16 and C-reactive protein at 11.1. Notable for the fact that in August 2020 her CRP was 9.9 and sedimentation rate 56. I have looked at her x-rays. It is true that the bone destruction is very impressive however the patient came into this clinic for the wound on her right foot with pieces of bone literally falling out anteriorly with purulent material. I am not exactly sure I could have expected anything different. She has not been systemically unwell no fever chills or blood sugars have been reasonable. 6/28; she arrives with a right heel  closed. The substantial area on the right anterior foot looks healthy. Much better looking surface. I think we can change to [facility] seems to help this previously. She is getting her antibiotics at dialysis she should be just about finished 7/9; changed to [facility] last week. Surface wound looks satisfactory not much change in surface area however. She is going to [place] state next week this is usually a difficult thing for this patient follow-up will be for 2 weeks. 7/23; using Hydrofera Blue. She returns from her trip and the wound looks surprisingly good. Usually when this patient goes on trips she comes back with a lot of problems with the wound. She is saying that she sometimes feels an episodic "crunching" feeling on the lateral part of the foot. She is neuropathic and not feeling pain but wonders whether this could be a neuropathic dysesthesia. 11/13/19-Patient returns after 3 weeks, the wound itself is stable and patient states that there is nothing new going on she is on some extra anxiety medications and is resisting the temptation to pick at the dry skin around the wound. 9/20; patient has not been here in over a month and I have not seen her in 2 months. The wound in terms of size I think is about the same. There is no exposed bone. She has a nonviable surface on this. She is supposed to be using [facility] however she is also been using some form of honey preparation as well as a silver-based dressings. I do not think she has any pattern to this. 10/4; 2-week follow-up. Patient has been using some form of spray which she says has honey and silver to purchase this online she has been covering it with gauze. In spite of this the wound actually looks quite good. The deeper divot distally appears to be close down. There is a rim of epithelialization. 10/18; 2-week follow-up. Patient has been using her Hydrofera Blue covered with her silver honey spray that she got  online. 11/1; 2-week  follow-up. She is using Hydrofera Blue with a silver honey spray. Wound bed is measuring smaller. She has noticed that her foot is warmer on the right. She is concerned about infection. For a long period of time I had her on prophylactic trimethoprim sulfamethoxazole DS 1 tablet daily. She is asking for this to be restarted. The patient is walking on this foot because of repairs that are being done in a home her but her room is on the second floor she has to go up and down stairs. I have cautioned against this however as usual she will do exactly what she wants to do 11/15; 2-week follow-up. She uses Hydrofera Blue with a silver/honey spray which I have never heard of. I think her wound looks about the same. Some epithelialization. No evidence that this is infected. I think she is walking on this more than we agreed on. She is going on extensive vacation over Thanksgiving 12/3; 2-week follow-up. She is using Hydrofera Blue however over Thanksgiving she ran out of this and she is simply been using Medihoney. In spite of this her wound is smaller almost divided into 2 now. She traveled extensively over Thanksgiving and actually looks quite good in spite of this. Usually this is been a marker of problems for her 12/17; 2-week follow-up. She is using Hydrofera Blue. The wound is smaller. Debris on the surface of this is fibrinous. She is traveling to [place] over the holidays which never bodes well for her wounds. I think she is walking more on her feet then she is even willing to admit and she tells me she does walk 2/11; using Hydrofera Blue. Her wounds are contracting however she walks in the clinic with a history that she has not been able to eat she has had vomiting. She also has right eye problems. Her blood sugar was 567. She had blood work done at dialysis and we called there to get her blood work although it still had not returned although they should have this by the end of  the day we were told. She also stated that she was not sure what her blood sugar was as her glucose monitor was not working and not coming to tomorrow. She also for some reason does not think her insulin pump is working well. Her endocrinologist is Dr. Elayne Snare 06/03/2020 upon evaluation today patient actually appears to be doing excellent in regard to her wound. In fact she tells me it was not even bleeding until she picked a dry piece of skin off while we were getting ready to come in and see her today. Fortunately there is no signs of active infection which is great news and overall very pleased. She is going to be seeing her neurologist sometime shortly I believe it might even be on Monday. Nonetheless there can proceed with the work-up as far as anything else going on with her eye the fact that her right eyelid is drooping. Electronic Signature(s) Signed: 06/03/2020 1:03:43 PM By: Worthy Keeler PA-C Entered By: Worthy Keeler on 06/03/2020 13:03:43 -------------------------------------------------------------------------------- Physical Exam Details Patient Name: Date of Service: Nancy [name] 06/03/2020 12:30 PM Medical Record Number: [id number] Patient Account Number: [id number] Date of Birth/Sex: Treating RN: [birth year] (50 y.o. [name] Primary Care Provider: [name], [name] Other Clinician: Referring Provider: Treating Provider/Extender: Worthy Keeler MO REIRA, [name] Weeks in Treatment: 26 Constitutional Well-nourished and well-hydrated in no acute distress. Respiratory normal breathing without  difficulty. Psychiatric this patient is able to make decisions and demonstrates good insight into disease process. Alert and Oriented x 3. pleasant and cooperative. Notes Upon inspection patient's wound again showed signs of being almost completely closed with just a very small 0.2 x 1.2 area remaining and overall very pleased in this regard. Electronic  Signature(s) Signed: 06/03/2020 1:04:00 PM By: Worthy Keeler PA-C Entered By: Worthy Keeler on 06/03/2020 13:04:00 -------------------------------------------------------------------------------- Physician Orders Details Patient Name: Date of Service: Nancy LLO [name] 06/03/2020 12:30 PM Medical Record Number: [id number] Patient Account Number: [id number] Date of Birth/Sex: Treating RN: 1971/01/29 (50 y.o. 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 Bathing/ Shower/ Hygiene May shower and wash wound with soap and water. Edema Control - Lymphedema / SCD / Other Bilateral Lower Extremities Elevate legs to the level of the heart or above for 30 minutes daily and/or when sitting, a frequency of: - throughout the day Avoid standing for long periods of time. Moisturize legs daily. - with dressing changes Off-Loading Other: - minimal weight bearing right foot Home Health New wound care orders this week; continue Home Health for wound care. May utilize formulary equivalent dressing for wound treatment orders unless otherwise specified. Other Home Health Orders/Instructions: - Interim Wound Treatment Wound #43 - Foot Wound Laterality: Right, Medial Cleanser: Wound Cleanser (Home Health) Every Other Day/30 Days Discharge Instructions: Cleanse the wound with wound cleanser or normal saline prior  to applying a clean dressing using gauze sponges, not tissue or cotton balls. Prim Dressing: KerraCel Ag Gelling Fiber Dressing, 2x2 in (silver alginate) Every Other Day/30 Days ary Discharge Instructions: Apply silver alginate to wound bed in clinic. pt may use silver honey spray at home Secondary Dressing: Woven Gauze Sponge, Non-Sterile 4x4 in [facility]) Every Other Day/30 Days Discharge Instructions: Apply over primary dressing as directed. Secured With: Elastic Bandage 4 inch (ACE bandage) (Home Health) Every Other Day/30 Days Discharge Instructions: Secure with ACE bandage as directed. Secured With: [employer], 4.5x3.1 (in/yd) [facility]) Every Other Day/30 Days Discharge Instructions: Secure with Kerlix as directed. Secured With: Paper Tape, 2x10 (in/yd) (Home Health) Every Other Day/30 Days Discharge Instructions: Secure dressing with tape as directed. Electronic Signature(s) Signed: 06/03/2020 4:38:50 PM By: Worthy Keeler PA-C Signed: 06/03/2020 5:05:02 PM By: Baruch Gouty RN, BSN Entered By: Baruch Gouty on 06/03/2020 13:00:36 -------------------------------------------------------------------------------- Problem List Details Patient Name: Date of Service: Nancy [name] 06/03/2020 12:30 PM Medical Record Number: [id number] Patient Account Number: [id number] Date of Birth/Sex: Treating RN: 10/07/1970 (50 y.o. [name] Primary Care Provider: [name], [name] Other Clinician: Referring Provider: Treating Provider/Extender: Worthy Keeler MO REIRA, [name] Weeks in Treatment: 45 Active Problems ICD-10 Encounter Code Description Active Date MDM Diagnosis [id number] Other chronic osteomyelitis, right ankle and foot 09/03/2018 No Yes L97.514 Non-pressure chronic ulcer of other part of right foot with necrosis of bone 09/03/2018 No Yes L97.411 Non-pressure chronic ulcer  of right heel and midfoot limited to breakdown of 09/17/2019 No  Yes skin E10.621 Type 1 diabetes mellitus with foot ulcer 09/24/2018 No Yes 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, right eye 05/13/2020 No Yes Inactive Problems ICD-10 Code Description Active Date Inactive Date L97.521 Non-pressure chronic ulcer of other part of left foot limited to breakdown of skin 04/20/2019 04/20/2019 L97.812 Non-pressure chronic ulcer of other part of right lower leg with fat layer exposed 02/24/2019 02/24/2019 Resolved Problems ICD-10 Code Description Active Date Resolved Date L02.415 Cutaneous abscess of right lower limb 12/25/2018 12/25/2018 Electronic Signature(s) Signed: 06/03/2020 1:03:04 PM By: Worthy Keeler PA-C Entered By: Worthy Keeler on 06/03/2020 13:03:04 -------------------------------------------------------------------------------- Progress Note Details Patient Name: Date of Service: [place], [name] 06/03/2020 12:30 PM Medical Record Number: [id number] Patient Account Number: [id number] Date of Birth/Sex: Treating RN: 11-Mar-1971 (50 y.o. [name] Primary Care Provider: [name], [name] Other Clinician: Referring Provider: Treating Provider/Extender: Worthy Keeler MO REIRA, [name] Weeks in Treatment: 50 Subjective Chief Complaint Information obtained from Patient Significant right foot ulcer with right leg ulcers History of Present Illness (HPI) 50 year old diabetic who is known to have type 1 diabetes which is poorly controlled last hemoglobin A1c was 11%. She comes in with a ulcerated area on the left lateral foot which has been there for over 6 months. Was recently she has been treated by [name] of podiatry who saw her last on 05/28/2016. Review of his notes revealed that the patient had incision and drainage with placement of antibiotic beads to the left foot on 04/11/2016 for possible osteomyelitis of the cuboid bone. Over the last year she's had a history of amputation of the left fifth toe and a femoropopliteal popliteal  bypass graft somewhere in April 2017. 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 His note [name] mentions that the patient has been referred to me for further wound care and possibly great candidate for hyperbaric oxygen therapy due to recurrent osteomyelitis. However we do not have any x-rays of biopsy reports confirming this. He has been on several antibiotics including Bactrim and most recently is on doxycycline for an MRSA. I understand, the patient was not a candidate for IV antibiotics as she has had previous PICC lines which resulted in blood clots in both arms. There was a x-ray report dated 04/04/2016 on [name] notes which showed evidence of fifth ray resection left foot with osteolytic changes noted to the fourth metatarsal and cuboid bone on the left. 06/13/2016 -- had a left foot x-ray which showed no acute fracture or dislocation and no definite radiographic evidence of osteomyelitis. Advanced osteopenia was seen. 06/20/2016 -- she has noticed a new wound on the right plantar foot in the region where she had a callus before. 06/27/16- the patient did have her x-ray of the right foot which showed no findings to suggest osteomyelitis. She saw her endocrinologist, [name], yesterday. Her A1c in January was 11. He also indicates mismanagement and noncompliance regarding her diabetes. She is currently on Bactrim for a lip infection. She is complaining of nausea, vomiting and diarrhea. She is unable to articulate the exact orders or dosing of the Bactrim; it is unclear when she will complete this. 07/04/2016 -- results from [facility] of ABIs with ankle waveforms were noted from 02/14/2016. The examination done on 06/27/2015 showed noncompressible ABIs with the right being 1.45 and the left being 1.33. The present examination showed a right ABI  of 1.19 on the left of 1.33. The conclusion was that right normal ABI in the lower extremity at rest however compared to  previous study which was noncompressible ABI may be falsely elevated side suggesting medial calcification. The left ABI suggested medial calcification. 08/01/2016 -- the patient had more redness and pain on her right foot and did not get to come to see as noted she see her PCP or go to the ER and decided to take some leftover metronidazole which she had at home. As usual, the patient does report she feels and is rather noncompliant. 08/08/2016 -- -- x-ray of the right foot -- FINDINGS:Transmetatarsal amputation is noted. No bony destruction is noted to suggest osteomyelitis. IMPRESSION: No evidence of osteomyelitis. Postsurgical changes are seen. MRI would be more sensitive for possible bony changes. Culture has grown Serratia Marcescens -- sensitive to Bactrim, ciprofloxacin, ceftazidime she was seen by [name] on 08/06/2016. He did not find any exposed bone, muscle, tendon, ligament or joint. There was no malodor and he did a excisional debridement in the office. ============ Old notes: 50 year old patient who is known to the wound clinic for a while had been away from the wound clinic since 09/01/2014. Over the last several months she has been admitted to various hospitals including [facility] [place]. She was treated for a right metatarsal osteomyelitis with a transmetatarsal amputation and this was done about 2 months ago. He has a small ulcerated area on the right heel and she continues to have an ulcerated area on the left plantar aspect of the foot. The patient was recently admitted to the [facility] hospital group between 7/12 and 10/18/2014. she was given 3 weeks of IV vancomycin and was to follow-up with her surgeons at [facility] and also took oral vancomycin for C. difficile colitis. 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 MRI of the left foot done on 10/14/2014 showed no abscess or osteomyelitis. 04/27/15; this is a patient we know from previous stays in the wound care center. She is a type I diabetic I am not sure of her control currently. Since the last time I saw her she is had a right transmetatarsal amputation and has no wounds on her right foot and has no open wounds. She is been followed at the wound care center at [facility] in [place]. She comes today with the desire to undergo hyperbaric treatment locally. Apparently one of her wound care providers in [place] has suggested hyperbarics. This is in response to an MRI from 04/18/15 that showed increased marrow signal and loss of the proximal fifth metatarsal cortex evidence of osteomyelitis with likely early osteomyelitis in the cuboid bone as well. She has a large wound over the base of the fifth metatarsal. She also has a eschar over her the tips of her toes on 1,3 and 5. She does not have peripheral pulses and apparently is going for an angiogram tomorrow which seems reasonable. After this she is going to infectious disease at [facility]. They have been using Medihoney to the large wound on the lateral aspect of the left foot to. The patient has known Charcot deformity from diabetic neuropathy. She also has known diabetic PAD. Surprisingly I can't see that she has had any recent antibiotics, the patient states  the last antibiotic she had was at the end of November for 10 days. I think this was in response to culture that showed group G strep although I'm not exactly sure where the culture was from. She is also had arterial studies on 03/29/15. This showed a right ABI of 1.4 that was noncompressible. Her left ABI was 0.73. 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 It was not felt that arterial  inflow was adequate for healing of a foot ulcer. Her Doppler waveforms looked monophasic ===== READMISSION 02/28/17; this is in an now 50 year old woman we've had at several different occasions in this clinic. 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 She has a remote ex-smoker. She was last seen in this clinic by Dr. Con Memos I think in May. More recently she is been followed by her podiatrist [name] an infectious disease Dr. Megan Salon. She has 2 open wounds the major one is over the right first metatarsal head she also has a wound on the left plantar foot. an MRI of the right foot on 01/01/17 showed a soft tissue ulcer along the plantar aspect of the first metatarsal base consistent with osteomyelitis of the first metatarsal stump. Dr. 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 According to the patient this is been open for slightly over a month. She has been on a combination of Cipro 500 twice a day, Zyvox 600 twice a day and Flagyl 500 3 times a day for over a month now as directed by Dr. Megan Salon. cultures of the right foot earlier this year showed MRSA in January and Serratia in May. January also had a few viridans strep. Recent x-rays of both feet were done and [name] office and I don't have these reports. The patient has known PAD and has a history of aleft femoropopliteal bypass in April 2017. 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 Dwyane Dee. In spite of this the last hemoglobin A1c I can see is 10.1 on 01/01/2017. She is being referred by 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 She is been using Medihoney to this area and also an  area on the left midfoot. She is using healing sandals bilaterally. ABIs in this clinic at the left posterior tibial was 1.1 noncompressible on the right READMISSION Non invasive vascular [facility]OV[facility] 5/18 Aftercare following surgery of the circulatory system Procedure Note - Interface, External Ris In - 08/13/2016 11:05 AM EDT Procedure: Examination consists of physiologic resting arterial pressures of the brachial and ankle arteries bilaterally with continuous wave Doppler waveform analysis. Previous: Previous exam performed on 02/14/16 demonstrated ABIs of Rt = 1.19 and Lt = 1.33. Right: ABI = non-compressible PT 1.47 DP. S/P transmet amputation. , Left: ABI = 1.52, 2nd digit pressure = 87 mmHg Conclusions: Right: ABI (>1.3) may be falsely elevated, suggesting medial calcification. Left: ABI (>1.3) may be falsely elevated, suggesting medial calcification The patient is a now 50 year old type I diabetic is had multiple issues her graded to chronic diabetic foot ulcers. 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 We had her in the clinic lastin November. At that point  she had wounds on her bilateral feet.she had wanted to try hyperbarics however the healogics review process denied her because she hadn't followed up with her vascular surgeon for her left femoropopliteal bypass. The bypass was done by Dr. Raul Del at [facility]. We made her a follow-up with Dr. Raul Del however she did not keep the appointment and therefore she was not approved The patient shows me a small wound on her left fourth metatarsal head on her phone. She developed rapid discoloration in the plantar aspect of the left foot and she was admitted to hospital from 2/2 through 05/10/17 with wet gangrene of the left foot osteomyelitis of the fourth metatarsal heads. She was admitted acutely ill with a temperature of 103. She was started on broad-spectrum  vancomycin and cefepime. On 05/06/17 she was taken to the OR by [name] her podiatric surgeon for an incision and drainage irrigation of the left foot wound. Cultures from this surgery revealed group be strep and anaerobes. she was seen by Dr.Xu of orthopedic surgery and scheduled for a below-knee amputation which she u refused. Ultimately she was discharged on Levaquin and Flagyl for one month. MRI 05/05/17 done while she was in the hospital showed abscess adjacent to the fourth metatarsal head and neck small abscess around the fourth flexor tendon. Inflammatory phlegmon and gas in the soft tissues along the lateral aspect of the fourth phalanx. Findings worrisome for osteomyelitis involving the fourth proximal and middle phalanx and also the third and fourth metatarsals. Finally the patient had actually shortly before this followed up with Dr. Raul Del at no time on 04/29/17. He felt that her left femoropopliteal bypass was patent he felt that her left-sided toe pressures more than adequate for healing a wound on the left foot. This was before her acute presentation. 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 05/28/17; she is started hyperbarics. The patient tells me that for some reason she was not actually on Levaquin but I think on ciprofloxacin. She was on Flagyl. She only started her Levaquin yesterday due to some difficulty with the pharmacy and perhaps her sister picking it up. She has an appointment with [name] tomorrow and with infectious disease early next week. She has no new complaints 06/06/17; the patient continues in hyperbarics. She saw [name] on 05/29/17 who is her podiatric surgeon. He is elected for a transmetatarsal amputation on 06/27/17. I'm not sure at what level he plans to do this amputation. The patient is unaware ooShe also saw Dr. Megan Salon of infectious disease who elected to continue her on current antibiotics I think this is ciprofloxacin and Flagyl. I'll need to  clarify with her tomorrow if she actually has this. We're using silver alginate to the actual wound. Necrotic surface today with material under the flap of her foot. ooOriginal MRI showed abscesses as well as osteomyelitis of the proximal and middle fourth phalanx and the third and fourth metatarsal heads 06/11/17; patient continues in hyperbarics and continues on oral antibiotics. She is doing well. The wound looks better. The necrotic part of this under the flap in her superior foot also looks better. she is been to see [name]. I haven't had a chance to look at his note. Apparently he has put the transmetatarsal amputation on hold her request it is still planning to take her to the OR for debridement and product application ACEL. I'll see if I can find his note. I'll therefore leave product ordering/requests to [name] for now. I was going to  look at Dermagraft 06/18/17-she is here in follow-up evaluation for bilateral foot wounds. She continues with hyperbaric therapy. She states she has been applying manuka honey to the right plantar foot and alternate manuka honey and silver alginate to the left foot, despite our orders. We will continue with same treatment plan and she will follo up next week. 06/25/17; I have reviewed [name] last note from 3/11. She has operative debridement in 2 days' time. By review his note apparently they're going to place there is skin over the majority of this wound which is a good choice. She has a small satellite area at the most proximal part of this wound on the left plantar foot. The area on the right plantar foot we've been using silver alginate and it is close to healing. 07/02/17; unfortunately the patient was not easily approved for [name] proposed surgery. I'm not completely certain what the issue is. She has been using silver alginate to the wound she has completed a first course of hyperbarics. She is still on Levaquin and Flagyl. I have really lost  track of the time course here.I suspect she should have another week to 2 of antibiotics. I'll need to see if she is followed up with infectious disease Dr. Megan Salon 07/09/17; the patient is followed up with Dr. Megan Salon. 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 She continues on Levaquin and metronidazole continuing both of these for now I think she is been on fr about 6 weeks. She still has some drainage but no pain. No fever. Her had been plans for her to go to the OR for operative debridement with her podiatrist [name], I am not exactly sure where that is. I'll probably slip a note to [name] today. I note that she follows with Dr. Dwyane Dee of endocrinology. We have her recertified for hyperbaric oxygen. I have not heard about Dermagraft however I'll see if [name] is planning a skin substitute as well 07/16/17; the patient tells me she is just about out of [place]. I'll need to check Dr. Hale Bogus last notes on this. She states she has plenty of Flagyl however. She comes in today complaining of pain in the right lateral foot which she said lasted for about a day. The wound on the right foot is actually much more medially. She also tells me that the [facility] cost a lot of pain in the left foot wound and she turned back to silver alginate. Finally Dermagraft has a $353 per application co-pay. She cannot afford this 07/23/17; patient arrives today with the wound not much smaller. There is not much new to add. She has not heard from [name] all try to put in a call to them today. She was asking about Dermagraft again and she has an over $299 per application co-pay she states that she would be willing to try to do a payment plan. I been tried to avoid this. We've been using silver alginate, I'll change to [facility] 07/30/17-She is here in follow-up evaluation for left foot ulcer. She continues hyperbaric medicine. The left foot ulcer is stable  we will continue with same treatment plan 08/06/17; she is here for evaluation of her left foot ulcer. Currently being treated for hyperbarics or underlying osteomyelitis. She is completed antibiotics. The left foot ulcer is better smaller with healthier looking granulation. For various reasons I am not really clear on we never got her back to  the OR with [name]. He did not respond to my secure text message. Nevertheless I think that surgery on this point is not necessary nor am I completely clear that a skin substitute is necessary The patient is complaining about pain on the outside of her right foot. 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 There is no erythema. She also states the foot is warm versus her other part of her upper leg and this is largely true. It is not totally clear to me what's causing this. She thinks it's different from her usual neuropathy pain 08/13/17; she arrives in clinic today with a small wound which is superficial on her right first metatarsal head. 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 She tells [place] she was up on her feet over the Mother's Day celebration. ooThe large wound is on the left foot. Continues with hyperbarics for underlying osteomyelitis. We're using Hydrofera Blue. She asked me today about where we were with Dermagraft. I had actually excluded this because of the co-pay however she wants to assume this therefore I'll recheck the co-pay an order for next week. 08/20/17; the patient agreed to accept the co-pay of the first Apligraf which we applied today. She is disappointed she is finishing hyperbarics will run this through the insurance on the extent of the foot infection and the extent of the wound that she had however she is already had 60 dive's. Dermagraft No. 1 08/27/17; Dermagraft No. 2. She is not eligible for any more hyperbaric treatments this month. She reports a fair amount of drainage and she actually  changed to the external dressings without disturbing the direct contact layer 09/03/17; the patient arrived in clinic today with the wound superficially looking quite healthy. Nice vibrant red tissue with some advancing epithelialization although not as much adherence of the flap as I might like. However she noted on her own fourth toe some bogginess and she brought that to our attention. Indeed this was boggy feeling like a possibility of subcutaneous fluid. 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 She is not been unwell. We've been using Dermagraft 09/10/17; the culture that I did not last week was MRSA. She saw Dr. Megan Salon this morning who is going to start her on vancomycin. I had sent him a secure a text message yesterday. I also spoke with her podiatric surgeon [name] about surgery on this foot the options for conserving a functional foot etc. Promised me he would see her and will make back consultation today. Paradoxically her actual wound on the plantar aspect of her left foot looks really quite good. I had given her 5 days worth of Baxdella to cover her for MRSA. Her MRI came back showing osteomyelitis within the third metatarsal shaft and head and base of the third and fourth proximal phalanx. She had extensive inflammatory changes throughout the soft tissue of the lateral forefoot. With an ill-defined fluid around the fourth metatarsal extending into the plantar and dorsal soft tissues 09/19/17; the patient is actually on oral Septra and Flagyl. She apparently refused IV vancomycin. She also saw [name] at my request who is planning her for a left BKA sometime in mid July. MRI showed osteomyelitis within the third metatarsal shaft and head and the basis of the third and fourth proximal phalanx. I believe there was felt to be possible septic arthritis involving the third MTP. 09/26/17;  the patient went back to Dr.  Megan Salon at my suggestion and is now receiving IV daptomycin. Her wound continues to look quite good making the decision to proceed with a transmetatarsal amputation although more difficult for the patient. I believe in my extensive discussions with her she has a good sense of the pros and cons of this. I don't NV the tuft decision she has to make. She has an appointment with [name] I believe in mid July and I previously spoken to him about this issue Has we had used 3 previous Dermagraft. Given the condition of the wound surface I went ahead and added the fourth one today area and I did this not fully realizing that she'll be traveling to [place] next week. I'm hopeful she can come back in 2 weeks 10/21/17; Her same Dermagraft on for about 3-1/2 weeks. In spite of this the wound arrives looking quite healthy. There is been a lot of healing dimensions are smaller. Looking at the square shaped wound she has now there is some undermining and some depth medially under the undermining although I cannot palpate any bone. No surrounding infection is obvious. She has difficult questions about how to look at this going forward vis--vis amputations versus continued medical therapy. T be truthful the wound is looks so o healthy and it is continued to contract. Hard to justify foot surgery at this point although I still told her that I think it might come to that if we are not able to eradicate the underlying MRSA. She is still highly at risk and she understands this 11/06/17 on evaluation today patient appears to be doing better in regard to her foot ulcer. She's been tolerating the dressing changes without complication. Currently she is here for her Dermagraft #6. Her wound continues to make excellent progress at this point. She does not appear to have any evidence of infection which is good news. 11/13/17 on evaluation today patient appears to be doing excellent at this time. She is here for repeat  Dermagraft application. This is #7. Overall her wound seems to be making great progress. 12/05/17; the patient arrives with the wound in much better condition than when I last saw this almost 6 weeks ago. She still has a small probing area in the left metatarsal head region on the lateral aspect of her foot. We applied her last Dermagraft today. ooSince the last time she is here she has what appears to a been a blood blister on the plantar aspect of left foot although I don't see this is threatening. There is also a thick raised tissue on the right mid metatarsal head region. This was not there I don't think the last time she was here 3 weeks ago. 12/12/17; the patient continues to have a small programming area in the left metatarsal head region on the lateral aspect of her foot which was the initial large surgical wound. I applied her last Apligraf last week. I'm going to use Endoform starting today ooUnfortunately she has an excoriated area in the left mid foot and the right mid foot. The left midfoot looks like a blistered area this was not opened last week it certainly is open today. Using silver alginate on these areas. She promises me she is offloading this. 12/19/17; the small probing area in the left metatarsal head eyes think is shallower. In general her original wound looks better. We've been using Endoform. The area inferiorly that I think was trauma last week still requires debridement  a lot of nonviable surface which I removed. She still has an open open area distally in her foot ooSimilarly on the right foot there is tightly adherent surface debris which I removed. Still areas that don't look completely epithelialized. This is a small open area. We used silver alginate on these areas 12/26/2017; the patient did not have the supplies we ordered from last week including the Endoform. The original large wound on the left lateral foot looks healthy. She still has the undermining area that is  largely unchanged from last week. She has the same heavily callused raised edged wounds on the right mid and left midfoot. Both of these requiring debridement. We have been using silver alginate on these areas 01/02/2018; there is still supply issues. We are going to try to use Prisma but I am not sure she actually got it from what she is saying. She has a new open area on the lateral aspect of the left fourth toe [previous fifth ray amputation]. Still the one tunneling area over the fourth metatarsal head. The area is in the midfoot bilaterally still have thick callus around them. She is concerned about a raised swelling on the lateral aspect of the foot. However she is completely insensate 01/10/2018; we are using Prisma to the wounds on her bilateral feet. Surprisingly the tunneling area over the left fourth metatarsal head that was part of her original surgery has closed down. She has a small open area remaining on the incision line. 2 open areas in the midfoot. 02/10/2018; the patient arrives back in clinic after a month hiatus. She was traveling to visit family in [place]. Is fairly clear she was not offloading the areas on her feet. The original wound over the left lateral foot at the level of metatarsal heads is reopened and probes medially by about a centimeter or 2. She notes that a week ago she had purulent drainage come out of an area on the left midfoot. Paradoxically the worst area is actually on the right foot is extensive with purulent drainage. We will use silver alginate today 02/17/2018; the patient has 3 wounds one over the left lateral foot. She still has a small area over the metatarsal heads which is the remnant of her original surgical wound. This has medial probing depth of roughly 1.4 cm somewhat better than last week. The area on the right foot is larger. We have been using silver alginate to all areas. The area on the right foot and left foot that we cultured last week  showed both Klebsiella and Proteus. Both of these are quinolone sensitive. The patient put her's self on Bactrim and Flagyl that she had left hanging around from prior antibiotic usages. She was apparently on this last week when she arrived. I did not realize this. Unfortunately the Bactrim will not cover either 1 of these organisms. We will send in Cipro 500 twice daily for a week 03/04/2018; the patient has 2 wounds on the left foot one is the original wound which was a surgical wound for a deep DFU. At one point this had exposed bone. She still has an area over the fourth metatarsal head that probes about 1.4 cm although I think this is better than last week. I been using silver nitrate to try and promote tissue adherence and been using silver alginate here. ooShe also has an area in the left midfoot. This has some depth but a small linear wound. Still requiring debridement. ooOn the right midfoot is  a circular wound. A lot of thick callus around this area. ooWe have been using silver alginate to all wound areas ooShe is completed the ciprofloxacin I gave her 2 weeks ago. 03/11/2018; the patient continues to have 2 open areas on the left foot 1 of which was the original surgical wound for a deep DFU. Only a small probing area remains although this is not much different from last week we have been using silver alginate. The other area is on the midfoot this is smaller linear but still with some depth. We have been using silver alginate here as well ooOn the right foot she has a small circular wound in the mid aspect. This is not much smaller than last time. We have been using silver alginate here as well 03/18/2018; she has 3 wounds on the left foot the original surgical wound, a very superficial wound in the mid aspect and then finally the area in the mid plantar foot. She arrives in today with a very concerning area in the wound in the mid plantar foot which is her most proximal wound. There  is undermining here of roughly 1-1/2 cm superiorly. Serosanguineous drainage. She tells me she had some pain on for over the weekend that shot up her foot into her thigh and she tells me that she had a nodule in the groin area. ooShe has the single wound in the right foot. ooWe are using endoform to both wound areas 03/24/2018; the patient arrives with the original surgical wound in the area on the left midfoot about the same as last week. There is a collection of fluid under the surface of the skin extending from the surgical wound towards the midfoot although it does not reach the midfoot wound. The area on the right foot is about the same. Cultures from last week of the left midfoot wound showed abundant Klebsiella abundant Enterococcus faecalis and moderate methicillin resistant staph I gave her Levaquin but this would have only covered the Klebsiella. She will need linezolid 04/01/2018; she is taking linezolid but for the first few days only took 1 a day. I have advised her to finish this at twice daily dosing. In any case all of her wounds are a lot better especially on the left foot. The original surgical wound is closed. The area on the left midfoot considerably smaller. The area on the right foot also smaller. 04/08/2018; her original surgical wound/osteomyelitis on the left foot remains closed. She has area on the left foot that is in the midfoot area but she had some streaking towards this. This is not connected with her original wound at least not visually. ooSmall wound on the right midfoot appears somewhat smaller. 04/15/18; both wounds looks better. Original wound is better left midfoot. Using silver alginate 1/21; patient states she uses saltwater soak in, stones or remove callus from around her wounds. She is also concerned about a blood blister she had on the left foot but it simply resolved on its own. We've been using silver alginate 1/28; the patient arrives today with the  same streaking area from her metatarsals laterally [the site of her original surgical wound] down to the middle of her foot. There is some drainage in the subcutaneous area here. This concerns me that there is actually continued ongoing infection in the metatarsals probably the fourth and third. This fixates an MRI of the foot without contrast [chronic renal failure] ooThe wound in the mid part of the foot is small but I  wonder whether this area actually connects with the more distal foot. ooThe area on the right midfoot is probably about the same. Callus thick skin around the small wound which I removed with a curette we have been using silver alginate on both wound areas 2/4; culture I did of the draining site on the left foot last time grew methicillin sensitive staph aureus. MRI of the left foot showed interval resolution of the findings surrounding the third metatarsal joint on the prior study consistent with treated osteomyelitis. Chronic soft tissue ulceration in the plantar and lateral aspect of the forefoot without residual focal fluid collection. No evidence of recurrent osteomyelitis. 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 She tells me she had nausea and vomiting over the weekend and missed 2 days. In general her foot looks much the same. She has a small open area just below the left fourth metatarsal head. A linear area in the left midfoot. Some discoloration extending from the inferior part of this into the left lateral foot although this appears to be superficial. She has a small area on the right midfoot which generally looks smaller after debridement 2/18; the patient is completing his cephalexin and has another 2 days. She continues to  have open areas on the left and right foot. 2/25; she is now off antibiotics. The area on the left foot at the site of her original surgical wound has closed yet again. She still has open areas in the mid part of her foot however these appear smaller. The area on the right mid foot looks about the same. We have been using silver alginate She tells me she had a serious hypoglycemic spell at home. She had to have EMS called and get IV dextrose 3/3; disappointing on the left lateral foot large area of necrotic tissue surrounding the linear area. This appears to track up towards the same original surgical wound. Required extensive debridement. The area on the right plantar foot is not a lot better also using silver 3/12; the culture I did last time showed abundant enterococcus. I have prescribed Augmentin, should cover any unrecognized anaerobes as well. In addition there were a few MRSA and Serratia that would not be well covered although I did not want to give her multiple antibiotics. She comes in today with a new wound in the right midfoot this is not connected with the original wound over her MTP a lot of thick callus tissue around both wounds but once again she said she is not walking on these areas 3/17-Patient comes in for follow-up on the bilateral plantar wounds, the right midfoot and the left plantar wound. Both these are heavily callused surrounding the wounds. We are continuing to use silver alginate, she is compliant with offloading and states she uses a wheelchair fairly often at home 3/24; both wound areas have thick callus. However things actually look quite a bit better here for the majority of her left foot and the right foot. 3/31; patient continues to have thick callused somewhat irritated looking tissue around the wounds which individually are fairly superficial. There is no evidence of surrounding infection. We have been using silver alginate however I change that to [facility]  today 4/17; patient returns to clinic after having a  scare with Covid she tested negative in her primary doctor's office. She has been using Hydrofera Blue. She does not have an open area on the right foot. On the left foot she has a small open area with the mid area not completely viable. She showed me pictures of what looks like a hemorrhagic blister from several days ago but that seems to have healed over this was on the lateral left foot 4/21; patient comes in to clinic with both her wounds on her feet closed. However over the weekend she started having pain in her right foot and leg up into the thigh. She felt as though she was running a low-grade fever but did not take her temperature. She took a doxycycline that she had leftover and yesterday a single Septra and metronidazole. She thinks things feel somewhat better. 4/28; duplex ultrasound I ordered last week was negative for DVT or superficial thrombophlebitis. She is completed the doxycycline I gave her. States she is still having a lot of pain in the right calf and right ankle which is no better than last week. She cannot sleep. She also states she has a temperature of up to 101, coughing and complaining of visual loss in her bilateral eyes. Apparently she was tested for Covid 2 weeks ago at [facility] and that was negative. Readmission: 09/03/18 patient presents back for reevaluation after having been evaluated at the end of April regarding erythema and swelling of her right lower extremity. Subsequently she ended up going to the hospital on 07/29/18 and was admitted not to be discharged until 08/08/18. Unfortunately it was noted during the time that she was in the hospital that she did have methicillin-resistant Staphylococcus aureus as the infection noted at the site. It was also determined that she did have osteomyelitis which appears to be fairly significant. She was treated with vancomycin and in fact is still on IV vancomycin at dialysis  currently. This is actually slated to continue until 09/12/18 at least which will be the completion of the six weeks of therapy. Nonetheless based on what I'm seeing at this point I'm not sure she will be anywhere near ready to discontinue antibiotics at that time. Since she was released from the hospital she was seen by [name] who is her podiatrist on 08/27/18. His note specifically states that he is recommended that the patient needs of one knee amputation on the right as she has a life- threatening situation that can lead quickly to sepsis. The patient advised she would like to try to save her leg to which [name] apparently told her that this was against all medical advice. She also want to discontinue the Wound VAC which had been initiated due to the fact that she wasn't pleased with how the wound was looking and subsequently she wanted to pursue applying Medihoney at that time. He stated that he did not believe that the right lower extremity was salvageable and that the patient understood but would still like to attempt hyperbaric option therapy if it could be of any benefit. She was therefore referred back to [place] for further evaluation. He plans to see her back next week. Upon inspection today patient has a significant amount purulent drainage noted from the wound at this point. The bone in the distal portion of her foot also appears to be extremely necrotic and spongy. When I push down on the bone it bubbles and seeps purulent drainage from deeper in the end of the foot. I do not  think that this is likely going to heal very well at all and less aggressive surgical debridement were undertaken more than what I believe we can likely do here in our office. 09/12/2018; I have not seen this patient since the most recent hospitalization although she was in our clinic last week. I have reviewed some of her records from a complex hospitalization. 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 There is situation was complicated by MRSA bacteremia and acute on chronic renal failure now on dialysis. She is receiving vancomycin at dialysis. We started her on Dakin's wet-to-dry last week she is changing this daily. There is still purulent drainage coming out of her foot. Although she is apparently "agreeable" to a below-knee amputation which is been suggested by multiple clinicians she wants this to be done in [place]. She apparently has a telehealth visit with that provider sometime in late [place] 6/24. I have told her I think this is probably too long. Nevertheless I could not convince her to allow a local doctor to perform BKA. 09/19/2018; the patient has a large necrotic area on the right anterior foot. She has had previous transmetatarsal amputations. Culture I did last week showed MRSA nothing else she is on vancomycin at dialysis. She has continued leaking purulent drainage out of the distal part of the large circular wound on the right anterior foot. She apparently went to see Dr. 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 Somehow that translated into her being referred to plastic surgery for debridement of the area. I gather she basically refused amputation although I do not have a copy of Dr. Berenice Primas notes. The patient really wants to have a trial of hyperbaric oxygen. I agreed with initial assessment in this clinic that this was probably too far along to benefit however if she is going to have plastic surgery I think she would benefit from ancillary hyperbaric oxygen. The issue here is that the patient has benefited as maximally as any patient I have ever seen from hyperbaric oxygen therapy. Most recently she had exposed bone on the lateral part of her left foot after a surgical procedure and that actually has closed. She has eschared areas in both heels but no open area. She is remained systemically well. I am not  optimistic that anything can be done about this but the patient is very clear that she wants an attempt. The attempt would include a wound VAC further debridements and hyperbaric oxygen along with IV antibiotics. 6/26; I put her in for a trial of hyperbaric oxygen only because of the dramatic response she has had with wounds on her left midfoot earlier this year which was a surgical wound that went straight to her bone over the metatarsal heads and also remotely the left third toe. We will see if we can get this through our review process and insurance. She arrives in clinic with again purulent material pouring out of necrotic bone on the top of the foot distally. There is also some concerning erythema on the front of the leg that we marked. It is bit difficult to tell how tender this is because of neuropathy. I note from infectious disease that she had her vancomycin extended. 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 She had the wound VAC on for part of the week. The rest of the time she is putting various things on this including Medihoney, "ionized water"  silver sorb gel etc. 7/7; follow-up along with HBO. She is still on vancomycin at dialysis. She has a large open area on the dorsal right foot and a small dark eschar area on her heel. There is a lot less erythema in the area and a lot less tenderness. From an infection point of view I think this is better. She still has a lot of necrosis in the remaining right forefoot [previous TMA] we are still using the wound VAC in this area 7/16; follow-up along with HBO. I put her on linezolid after she finished her vancomycin. We started this last Friday I gave her 2 weeks worth. I had the expectation that she would be operatively debrided by Dr. Marla Roe but that still has not happened yet. Patient phoned the office this week. She arrives for review today after HBO. The distal part of this wound is completely necrotic.  Nonviable pieces of tendon bone was still purulent drainage. Also concerning that she has black eschar over the heel that is expanding. I think this may be indicative of infection in this area as well. She has less erythema and warmth in the ankle and calf but still an abnormal exam 7/21 follow-up along with HBO. I will renew her linezolid after checking a CBC with differential monitoring her blood counts especially her platelets. She was supposed to have surgery yesterday but if I am reading things correctly this was canceled after her blood sugar was found to be over 500. I thought Dr. Marla Roe who called me said that they were sending her to the ER but the patient states that was not the case. 7/28. Follow-up along with HBO. She is on linezolid I still do not have any lab work from dialysis even though I called last week. The patient is concerned about an area on her left lateral foot about the level of the base of her fifth metatarsal. I did not really see anything that ominous here however this patient is in [place] ability to point out problems that she is sensing and she has been accurate in the past Finally she received a call from Dr. Marla Roe who is referring her to another orthopedic surgeon stating that she is too booked up to take her to the operating room now. Was still using a wound VAC on the foot 8/3 -Follow-up after HBO, she is got another week of linezolid, she is to call ID for an appointment, x-rays of both feet were reviewed, the left foot x-ray with third MTP joint osteo- Right foot x-ray widespread osteo-in the right midfoot Right ankle x-ray does not show any active evidence of infection 8/11-Patient is seen after HBO, the wounds on the right foot appear to be about the same, the heel wound had some necrotic base over tendon that was debrided with a curette 8/21; patient is seen after HBO. The patient's wound on her dorsal foot actually looks reasonably good and there is  substantial amount of epithelialization however the open area distally still has a lot of necrotic debris partially bone. I cannot really get a good sense of just how deep this probes under the foot. She has been pressuring me this week to order medical maggots through a company in [place] for her. The problem I have is there is not a defined wound area here. On the positive side there is no purulence. She has been to see infectious disease she is still on Septra DS although I have not had a chance to review  their notes 8/28; patient is seen in conjunction with HBO. The wounds on her foot continued to improve including the right dorsal foot substantially the, the distal part of this wound and the area on the right heel. We have been using a wound VAC over this chronically. She is still on trimethoprim as directed by infectious disease 9/4; patient is seen in conjunction with HBO. Right dorsal foot wound substantially anteriorly is better however she continues to have a deep wound in the distal part of this that is not responding. We have been using silver collagen under border foam ooArea on the right plantar medial heel seems better. We have been using Hydrofera Blue 12/12/18 on evaluation today patient appears to be doing about the same with regard to her wound based on prior measurements. She does have some necrotic tissue noted on the lateral aspect of the wound that is going require a little bit of sharp debridement today. This includes what appears to be potentially either severely necrotic bone or tendon. Nonetheless other than that she does not appear to have any severe infection which is good news 9/18; it is been 2 weeks since I saw this wound. She is tolerating HBO well. Continued dramatic improvement in the area on the right dorsal foot. She still has a small wound on the heel that we have been using Hydrofera Blue. She continues with a wound VAC 9/24; patient has to be seen emergently  today with a swelling on her right lateral lower leg. She says that she told Dr. Evette Doffing about this and also myself on a couple of occasions but I really have no recollection of this. She is not systemically unwell and her wound really looked good the last time I saw this. She showed this to providers at dialysis and she was able to verify that she was started on cephalexin today for 5 doses at dialysis. She dialyzes on Tuesday Thursday and Saturday. 10/2; patient is seen in conjunction with HBO. The area that is draining on the right anterior medial tibia is more extensive. Copious amounts of serosanguineous drainage with some purulence. We are still using the wound VAC on the original wound then it is stable. Culture I did of the original IandD showed MRSA I contacted dialysis she is now on vancomycin with dialysis treatments. I asked them to run a month 10/9; patient seen in conjunction with HBO. She had a new spontaneous open area just above the wound on the right medial tibia ankle. More swelling on the right medial tibia. Her wound on the foot looks about the same perhaps slightly better. There is no warmth spreading up her leg but no obvious erythema. her MRI of the foot and ankle and distal tib-fib is not booked for next Friday I discussed this with her in great detail over multiple days. it is likely she has spreading infection upper leg at least involving the distal 25% above the ankle. She knows that if I refer her to orthopedics for infectious disease they are going to recommend amputation and indeed I am not against this myself. We had a good trial at trying to heal the foot which is what she wanted along with antibiotics debridement and HBO however she clearly has spreading infection [probably staph aureus/MRSA]. Nevertheless she once again tells me she wants to wait the left of the MRI. She still makes comments about having her amputation done in [place]. 10/19; arrives today with  significant swelling on the lateral right leg.  Last culture I did showed Klebsiella. Multidrug-resistant. Cipro was intermediate sensitivity and that is what I have her on pending her MRI which apparently is going to be done on Thursday this week although this seems to be moving back and forth. She is not systemically unwell. We are using silver alginate on her major wound area on the right medial foot and the draining areas on the right lateral lower leg 10/26; MRI showed extensive abscess in the anterior compartment of the right leg also widespread osteomyelitis involving osseous structures of the midfoot and portions of the hindfoot. Also suspicion for osteomyelitis anterior aspect of the distal medial malleolus. Culture I did of the purulence once again showed a multidrug-resistant Klebsiella. I have been in contact with nephrology late last week and she has been started on cefepime at dialysis to replace the vancomycin We sent a copy of her MRI report to Dr. Geroge Baseman in [place] who is an orthopedic surgeon. The patient takes great stock in his opinion on this. She says she will go to [place] to have her leg amputated if Dr. Geroge Baseman does not feel there is any salvage options. 11/2; she still is not talk to her orthopedic surgeon in [place]. Apparently he will call her at [phone] this afternoon. The quality of this is she has not allowed me to refer her anywhere. She has been told over and over that she needs this amputated but has not agreed to be referred. She tells me her blood sugar was 600 last night but she has not been febrile. 11/9; she never did got a call from the orthopedic surgeon in [place] therefore that is off the radar. We have arranged to get her see orthopedic surgery at [facility]. She still has a lot of draining purulence coming out of the new abscess in her right leg although that probably came from the osteomyelitis in her right foot and heel. Meanwhile the original wound  on the right foot looks very healthy. Continued improvement. The issue is that the last MRI showed osteomyelitis in her right foot extensively she now has an abscess in the right anterior lower leg. There is nobody in [place] who will offer this woman anything but an amputation and to be honest that is probably what she needs. I think she still wants to talk about limb salvage although at this point I just do not see that. She has completed her vancomycin at dialysis which was for the original staph aureus she is still on cefepime for the more recent Klebsiella. She has had a long course of both of these antibiotics which should have benefited the osteomyelitis on the right foot as well as the abscess. 11/16; apparently [place] elective surgery is shut down because of COVID-19 pandemic. I have reached out to some contacts at [facility] to see if we can get her an orthopedic appointment there. I am concerned about continually leaving this but for the moment everything is static. In fact her original large wound on this foot is closing down. It is the abscess on the right anterior leg that continues to drain purulent serosanguineous material. She is not currently on any antibiotics however she had a prolonged course of vancomycin [1 month] as well as cefepime for a month 02/24/2019 on evaluation today patient appears to be doing better than the last time I saw her. This is not a patient that I typically see. With that being said I am covering for Dr. Dellia Nims this week and again compared to  when I last saw her overall the wounds in particular seem to be doing significantly better which is good news. With that being said the patient tells me several disconcerting things. She has not been able to get in to see anyone for potential debridement in regard to her leg wounds although she tells me that she does not think it is necessary any longer because she is taking care of that herself. She noticed  a string coming out of the lower wound on her leg over the last week. The patient states that she subsequently decided that we must of pack something in there and started pulling the string out and as it kept coming and coming she realized this was likely her tendon. With that being said she continued to remove as much of this as she could. She then I subsequently proceeded to using tubes of antibiotic ointment which she will stick down into the wound and then scored as much as she can until she sees it coming out of the other wound opening. She states that in doing this she is actually made things better and there is less redness and irritation. With regard to her foot wound she does have some necrotic tendon and tissue noted in one small corner but again the actual wound itself seems to be doing better with good granulation in general compared to my last evaluation. 12/7; continued improvement in the wound on the substantial part of the right medial foot. Still a necrotic area inferiorly that required debridement but the rest of this looks very healthy and is contracting. She has 2 wounds on the right lateral leg which were her original drainage sites from her abscess but all of this looks a lot better as well. She has been using silver alginate after putting antibiotic biotic ointment in one wound and watching it come out the other. I have talked to her in some detail today. I had given her names of orthopedic surgeons at [facility] for second opinion on what to do about the right leg. I do not think the patient never called them. She has not been able to get a hold of the orthopedic surgeon in [place] that she had put a lot of faith in as being somebody would give her an opinion that she would trust. I talked to her today and said even if I could get her in to another orthopedic surgeon about the leg which she accept an amputation and she said she would not therefore I am not going to press this  issue for the moment 12/14; continued improvement in his substantial wound on the right medial foot. There is still a necrotic area inferiorly with tightly adherent necrotic debris which I have been working on debriding each time she is here. She does not have an orthopedic appointment. Since last time she was here I looked over her cultures which were essentially MRSA on the foot wound and gram-negative rods in the abscess on the anterior leg. 12/21; continued improvement in the area on the right medial foot. She is not up on this much and that is probably a good thing since I do not know it could support continuous ambulation. She has a small area on the right lateral leg which were remanence of the IandD's I did because of the abscess. I think she should probably have prophylactic antibiotics I am going to have to look this over to see if we can make an intelligent decision here. In the meantime  her major wound is come down nicely. Necrotic area inferiorly is still there but looks a lot better 04/06/2019; she has had some improvement in the overall surface area on the right medial foot somewhat narrowedr both but somewhat longer. The areas on the right lateral leg which were initial IandD sites are superficial. Nothing is present on the right heel. We are using silver alginate to the wound areas 1/18; right medial foot somewhat smaller. Still a deep probing area in the most distal recess of the wound. She has nothing open on the right leg. She has a new wound on the plantar aspect of her left fourth toe which may have come from just pulling skin. The patient using Medihoney on the wound on her foot under silver alginate. I cannot discourage her from this 2/1; 2-week follow-up using silver alginate on the right foot and her left fourth toe. The area on the right dorsal foot is contracted although there is still the deep area in the most distal part of the wound but still has some probing depth. No  overt infection 2/15; 2-week follow-up. She continues to have improvement in the surface area on the dorsal right foot. Even the tunneling area from last time is almost closed. The area that was on the plantar part of her left fourth toe over the PIP is indeed closed 3/1; 2-week follow-up. Continued improvement in surface area. The original divot that we have been debriding inferiorly I think has full epithelialization although the epithelialization is gone down into the wound with probably 4 mm of depth. Even under intense illumination I am unable to see anything open here. The remanence of the wound in this area actually look quite healthy. We have been using silver alginate 3/15; 2-week follow-up. Unfortunately not as good today. She has a comma shaped wound on the dorsal foot however the upper part of this is larger. Under illumination debris on the surface She also tells [place] that she was on her right leg 2 times in the last couple of weeks mostly to reach up for things above her head etc. She felt a sharp pain in the right leg which she thinks is somewhere from the ankle to the knee. The patient has neuropathy and is really uncertain. She cannot feel her foot so she does not think it was coming from there 3/29; 2-week follow-up. Her wound measures smaller. Surface of the wound appears reasonable. She is using silver alginate with underlying Medihoney. She has home health. X-rays I did of her tib-fib last time were negative although it did show arterial calcification 4/12; 2-week follow-up. Her wound measures smaller in length. Using manuka honey with silver alginate on top. She has home health. 4/26; 2-week follow-up. Her wound is smaller but still very adherent debris under illumination requiring debridement she has been using manuka honey with silver alginate. She has home health 08/28/19-Wound has about the same size, but with a layer of eschar at the lateral edge of the amputation site on the  right foot. Been using Hydrofera Blue. She is on suppressive Bactrim but apparently she has been taking it twice daily 6/7; I have not seen this wound and about 6 weeks. Since then she was up in [place]. By her own admission she was walking on the foot because she did not have a wheelchair. The wound is not nearly as healthy looking as it was the last time I saw this. We ordered different things for her but she only uses  Medihoney and silver alginate. As far as I know she is on suppressive trimethoprim sulfamethoxazole. She does not admit to any fever or chills. Her CBGs apparently are at baseline however she is saying that she feels some discomfort on the lateral part of her ankle I looked over her last inflammatory markers from the summer 2020 at which time she had a deeply necrotic infected wound in this area. On 11/10/2018 her sedimentation rate was 56 and C-reactive protein 9.9. This was 107 and 29 on 07/29/2018. 6/17; the patient had a necrotic wound the last time she was here on the right dorsal foot. After debridement I did a culture. This showed a very resistant ESBL Klebsiella as well as Enterococcus. Her x-ray of the foot which was done because of warmth and some discomfort showed bone destruction within the carpal bones involving the navicular acute cuboid lateral middle cuneiforms but essentially unchanged from her prior study which was done on 10/29/2018. The findings were felt to represent chronic osteomyelitis. We did inflammatory markers on her. Her white count was 5.25 sedimentation rate 16 and C-reactive protein at 11.1. Notable for the fact that in August 2020 her CRP was 9.9 and sedimentation rate 56. I have looked at her x-rays. It is true that the bone destruction is very impressive however the patient came into this clinic for the wound on her right foot with pieces of bone literally falling out anteriorly with purulent material. I am not exactly sure I could have expected  anything different. She has not been systemically unwell no fever chills or blood sugars have been reasonable. 6/28; she arrives with a right heel closed. The substantial area on the right anterior foot looks healthy. Much better looking surface. I think we can change to [facility] seems to help this previously. She is getting her antibiotics at dialysis she should be just about finished 7/9; changed to [facility] last week. Surface wound looks satisfactory not much change in surface area however. She is going to [place] state next week this is usually a difficult thing for this patient follow-up will be for 2 weeks. 7/23; using Hydrofera Blue. She returns from her trip and the wound looks surprisingly good. Usually when this patient goes on trips she comes back with a lot of problems with the wound. She is saying that she sometimes feels an episodic "crunching" feeling on the lateral part of the foot. She is neuropathic and not feeling pain but wonders whether this could be a neuropathic dysesthesia. 11/13/19-Patient returns after 3 weeks, the wound itself is stable and patient states that there is nothing new going on she is on some extra anxiety medications and is resisting the temptation to pick at the dry skin around the wound. 9/20; patient has not been here in over a month and I have not seen her in 2 months. The wound in terms of size I think is about the same. There is no exposed bone. She has a nonviable surface on this. She is supposed to be using [facility] however she is also been using some form of honey preparation as well as a silver-based dressings. I do not think she has any pattern to this. 10/4; 2-week follow-up. Patient has been using some form of spray which she says has honey and silver to purchase this online she has been covering it with gauze. In spite of this the wound actually looks quite good. The deeper divot distally appears to be close down. There is  a rim  of epithelialization. 10/18; 2-week follow-up. Patient has been using her Hydrofera Blue covered with her silver honey spray that she got online. 11/1; 2-week follow-up. She is using Hydrofera Blue with a silver honey spray. Wound bed is measuring smaller. She has noticed that her foot is warmer on the right. She is concerned about infection. For a long period of time I had her on prophylactic trimethoprim sulfamethoxazole DS 1 tablet daily. She is asking for this to be restarted. The patient is walking on this foot because of repairs that are being done in a home her but her room is on the second floor she has to go up and down stairs. I have cautioned against this however as usual she will do exactly what she wants to do 11/15; 2-week follow-up. She uses Hydrofera Blue with a silver/honey spray which I have never heard of. I think her wound looks about the same. Some epithelialization. No evidence that this is infected. I think she is walking on this more than we agreed on. She is going on extensive vacation over Thanksgiving 12/3; 2-week follow-up. She is using Hydrofera Blue however over Thanksgiving she ran out of this and she is simply been using Medihoney. In spite of this her wound is smaller almost divided into 2 now. She traveled extensively over Thanksgiving and actually looks quite good in spite of this. Usually this is been a marker of problems for her 12/17; 2-week follow-up. She is using Hydrofera Blue. The wound is smaller. Debris on the surface of this is fibrinous. She is traveling to [place] over the holidays which never bodes well for her wounds. I think she is walking more on her feet then she is even willing to admit and she tells me she does walk 2/11; using Hydrofera Blue. Her wounds are contracting however she walks in the clinic with a history that she has not been able to eat she has had vomiting. She also has right eye problems. Her blood sugar was 567. She had blood work  done at dialysis and we called there to get her blood work although it still had not returned although they should have this by the end of the day we were told. She also stated that she was not sure what her blood sugar was as her glucose monitor was not working and not coming to tomorrow. She also for some reason does not think her insulin pump is working well. Her endocrinologist is Dr. Elayne Snare 06/03/2020 upon evaluation today patient actually appears to be doing excellent in regard to her wound. In fact she tells me it was not even bleeding until she picked a dry piece of skin off while we were getting ready to come in and see her today. Fortunately there is no signs of active infection which is great news and overall very pleased. She is going to be seeing her neurologist sometime shortly I believe it might even be on Monday. Nonetheless there can proceed with the work-up as far as anything else going on with her eye the fact that her right eyelid is drooping. Objective Constitutional Well-nourished and well-hydrated in no acute distress. Vitals Time Taken: 12:22 PM, Height: 67 in, Weight: 125 lbs, BMI: 19.6, Temperature: 99.0 F, Pulse: 85 bpm, Respiratory Rate: 17 breaths/min, Blood Pressure: 174/100 mmHg, Capillary Blood Glucose: 275 mg/dl. Respiratory normal breathing without difficulty. Psychiatric this patient is able to make decisions and demonstrates good insight into disease process. Alert and Oriented x 3.  pleasant and cooperative. General Notes: Upon inspection patient's wound again showed signs of being almost completely closed with just a very small 0.2 x 1.2 area remaining and overall very pleased in this regard. Integumentary (Hair, Skin) Wound #43 status is Open. Original cause of wound was Gradually Appeared. The date acquired was: 08/04/2018. The wound has been in treatment 91 weeks. The wound is located on the Right,Medial Foot. The wound measures 0.4cm length x 0.4cm  width x 0.1cm depth; 0.126cm^2 area and 0.013cm^3 volume. There is Fat Layer (Subcutaneous Tissue) exposed. There is no tunneling or undermining noted. There is a medium amount of serosanguineous drainage noted. The wound margin is flat and intact. There is large (67-100%) pink granulation within the wound bed. There is no necrotic tissue within the wound bed. 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 Bathing/ Shower/ Hygiene: May shower and wash wound with soap and water. Edema Control - Lymphedema / SCD / Other: Elevate legs to the level of the heart or above for 30 minutes daily and/or when sitting, a frequency of: - throughout the day Avoid standing for long periods of time. Moisturize legs daily. - with dressing changes Off-Loading: Other: - minimal weight bearing right foot Home Health: New wound care orders this week; continue Home Health for wound care. May utilize formulary equivalent dressing for wound treatment orders unless otherwise specified. Other Home Health Orders/Instructions: - Interim WOUND #43: - Foot Wound Laterality: Right, Medial Cleanser: Wound Cleanser (Home Health) Every Other Day/30 Days Discharge Instructions: Cleanse the wound with wound cleanser or normal saline prior to applying a clean dressing using gauze sponges, not tissue or cotton balls. Prim Dressing: KerraCel Ag Gelling Fiber Dressing, 2x2 in (silver alginate) Every Other Day/30 Days ary Discharge Instructions: Apply silver alginate to wound bed in clinic. pt may use silver honey spray at home Secondary Dressing: Woven Gauze Sponge, Non-Sterile 4x4 in [place]) Every Other Day/30 Days Discharge Instructions: Apply over primary dressing as directed. Secured With: Elastic Bandage 4 inch (ACE bandage) (Home Health) Every Other Day/30 Days Discharge Instructions: Secure with ACE bandage as directed. Secured With: [employer], 4.5x3.1 (in/yd) [facility]) Every Other Day/30 Days Discharge Instructions: Secure with Kerlix as directed. Secured With: Paper T ape, 2x10 (in/yd) (Home Health) Every Other Day/30 Days Discharge Instructions: Secure dressing with tape as directed. 1. Would recommend currently that we just proceed with a little bit of a silver alginate dressing which I believe will do quite well with regard to the overall aid in helping to dry up this area and helping it to seal up finally completely. 2. I am also can recommend that we secure everything with roll gauze in order to keep this in place. Were using an Ace bandage to cover as well. We will see patient back for reevaluation in 3 weeks here in the clinic. If anything worsens or changes patient will contact our office for additional recommendations. Electronic Signature(s) Signed: 06/03/2020 1:05:10 PM By: Worthy Keeler PA-C Entered By: Worthy Keeler on 06/03/2020 13:05:10 -------------------------------------------------------------------------------- SuperBill Details Patient Name: Date of Service: Nancy LLO [name] 06/03/2020 Medical Record Number: [id number] Patient Account Number: [id number] Date of  Birth/Sex: Treating RN: 10-09-1970 (50 y.o. 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, right eye Facility Procedures CPT4 Code: [id number] Description: 99213 - WOUND CARE VISIT-LEV 3 EST PT Modifier: Quantity: 1 Physician Procedures : CPT4 Code Description Modifier [id number] 99213 - WC PHYS LEVEL 3 - EST PT ICD-10 Diagnosis Description M86.671 Other chronic osteomyelitis, right ankle and foot L97.514 Non-pressure chronic ulcer of other part of right foot with necrosis of bone L97.411  Non-pressure chronic ulcer of right heel and midfoot limited to breakdown of skin E10.621 Type 1 diabetes mellitus with foot ulcer Quantity: 1 Electronic Signature(s) Signed: 06/03/2020 1:05:23 PM By: Worthy Keeler PA-C Entered By: Worthy Keeler on 06/03/2020 13:05:22

## 2020-06-05 IMAGING — DX DG ANKLE COMPLETE 3+V*L*
3 series · 3 of 3 positions shown · non-contrast
Comparison: None

CLINICAL DATA: Left foot and ankle pain. History of diabetes and
diabetic foot ulcers.

EXAM:
LEFT ANKLE COMPLETE - 3+ VIEW

[ankle ap]
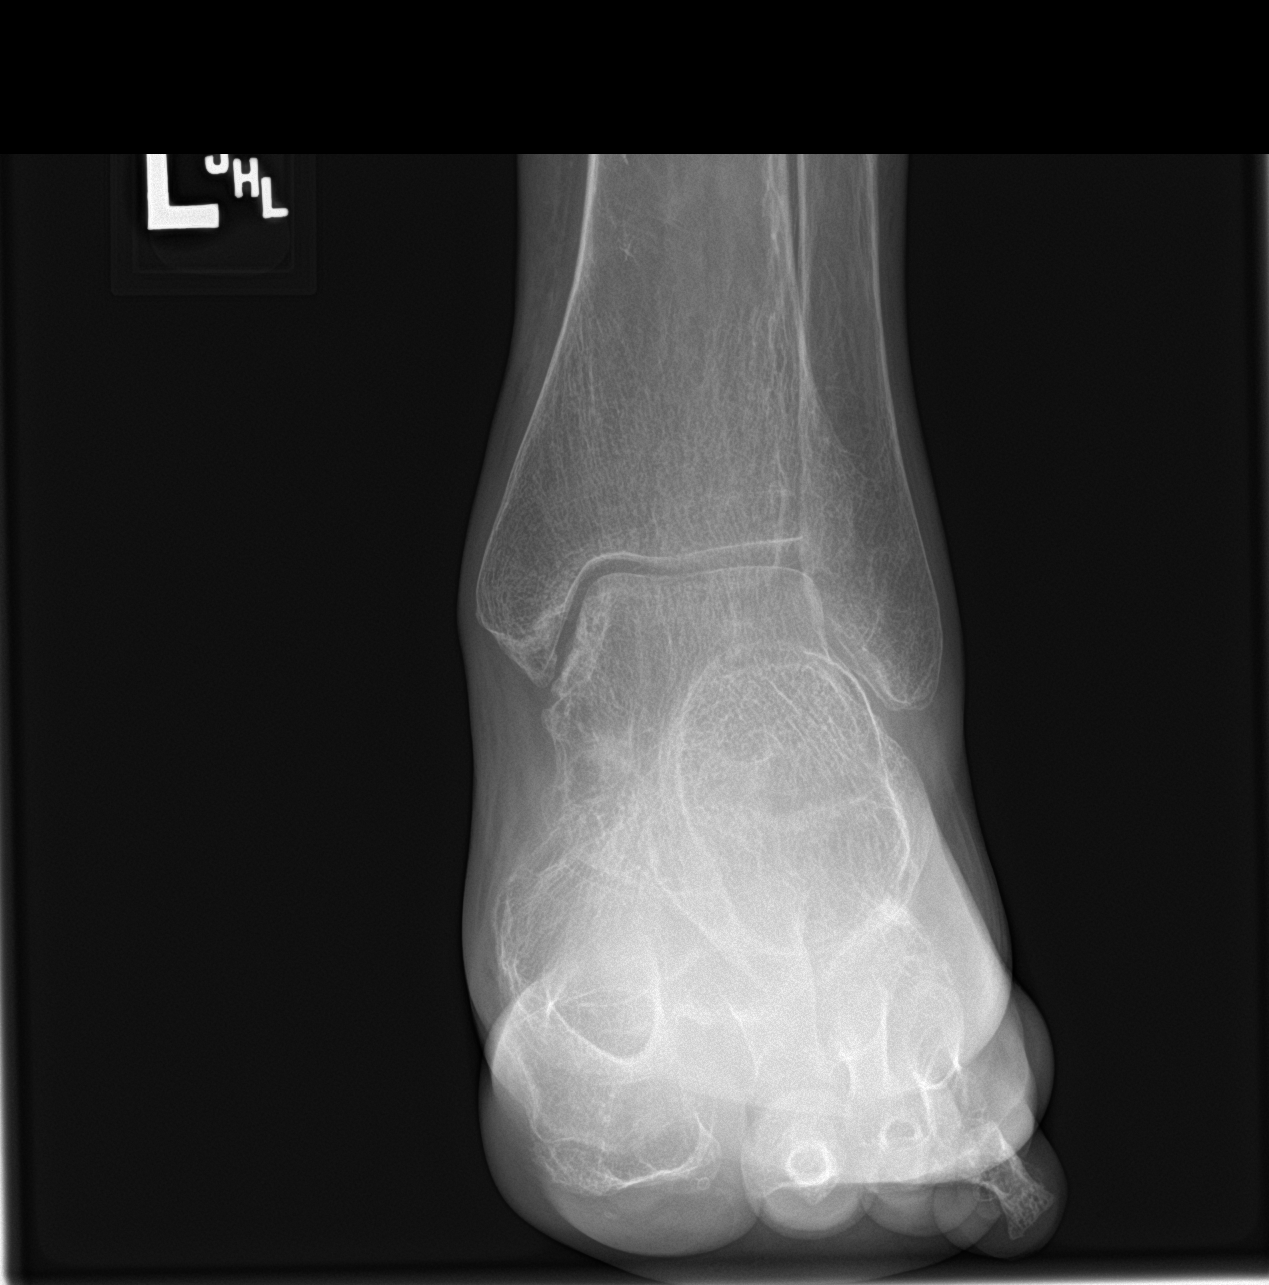

[ankle obl]
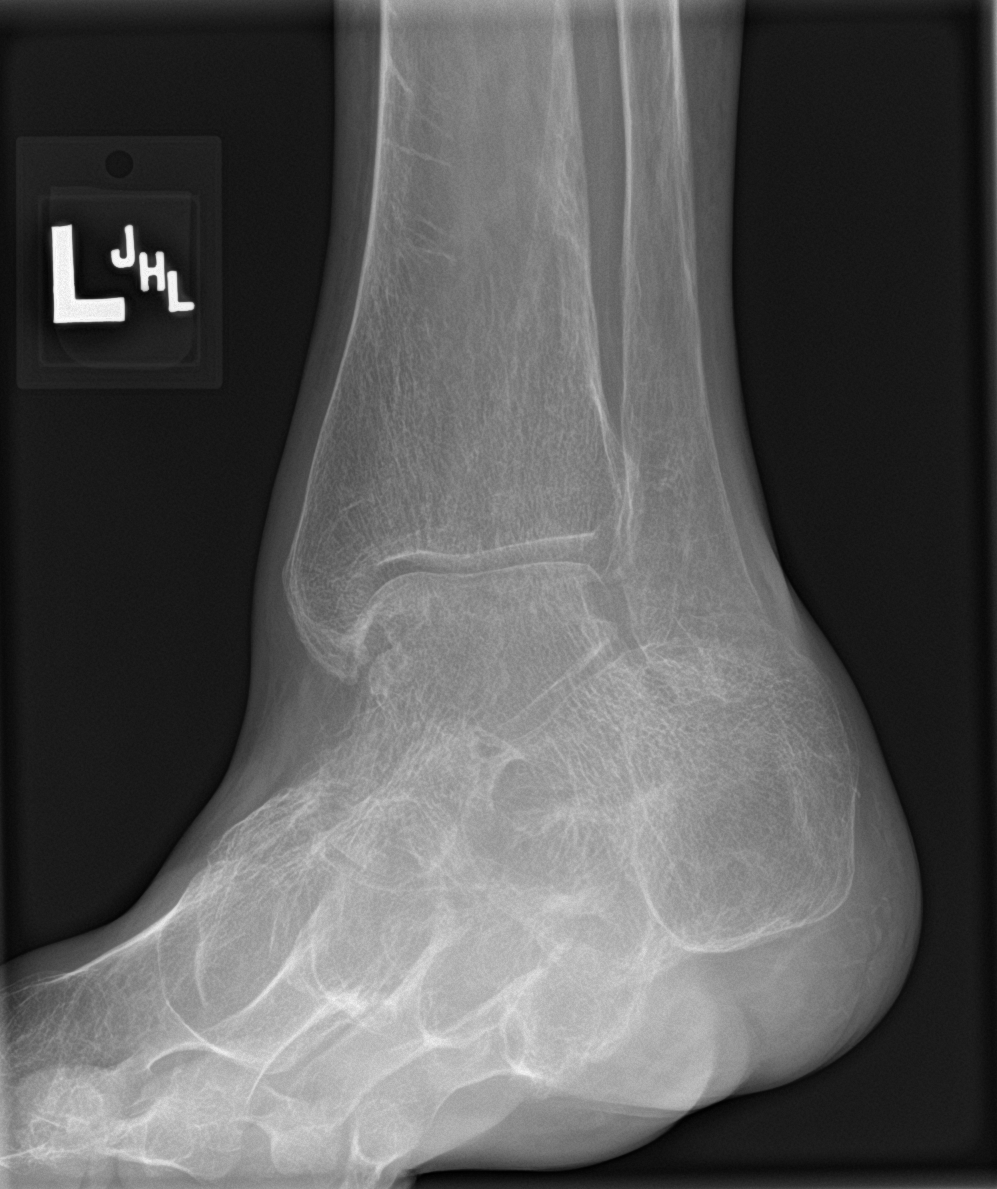

[ankle lat]
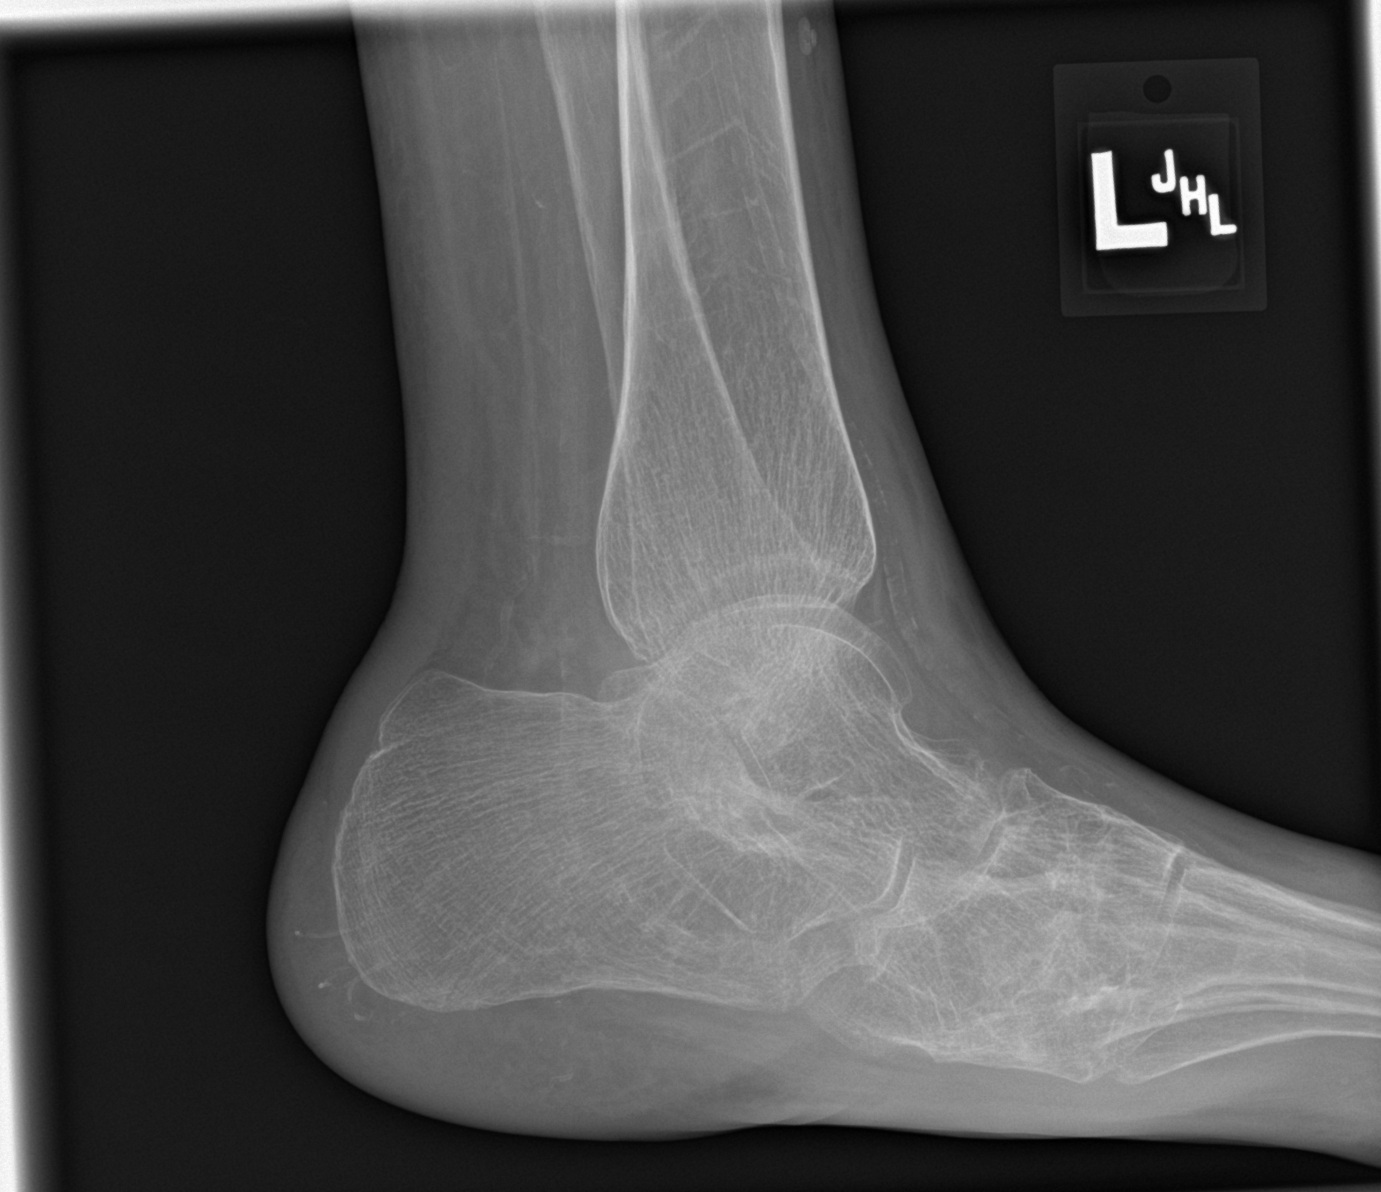

[3 of 3 positions shown; findings below may reference images not displayed]

FINDINGS: No fracture or bone lesion.

Skeletal structures are diffusely demineralized.

Ankle joint is normally aligned. There is mild narrowing of the
space between the medial malleolus and medial talus with mild
underlying sclerosis consistent with degenerative change.

There are arterial atherosclerotic calcifications. Soft tissues
otherwise unremarkable.
IMPRESSION: 1. No fracture or acute finding.

## 2020-06-05 IMAGING — DX DG FOOT COMPLETE 3+V*L*
3 series · 3 of 3 positions shown · non-contrast
Comparison: Left foot MRI, 05/05/2018. Left foot radiographs,
10/29/2018 and 09/03/2017.

CLINICAL DATA: Left ankle and foot pain.  History of diabetes.

EXAM:
LEFT FOOT - COMPLETE 3+ VIEW

[foot ap]
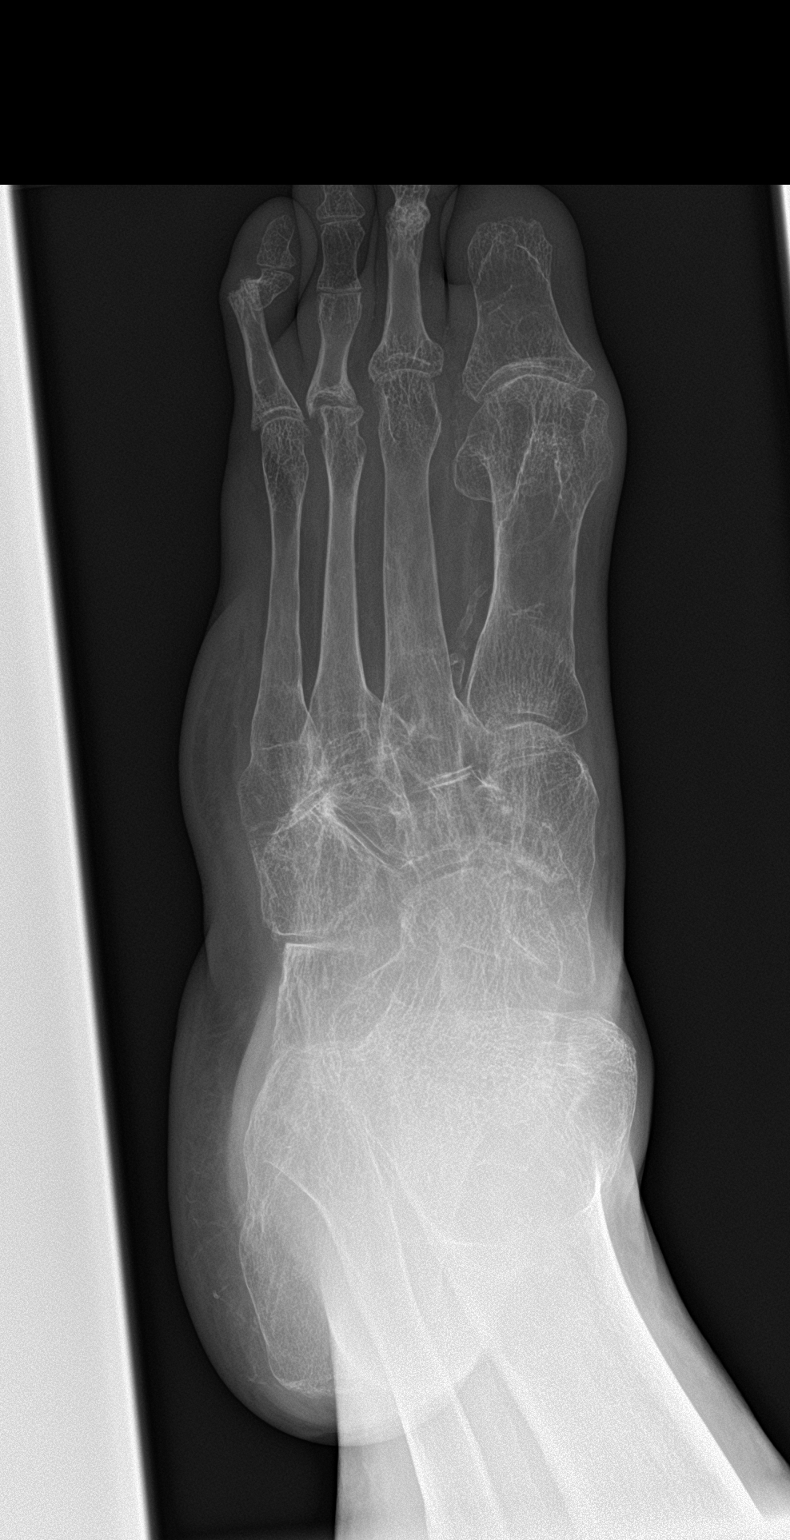

[foot obl]
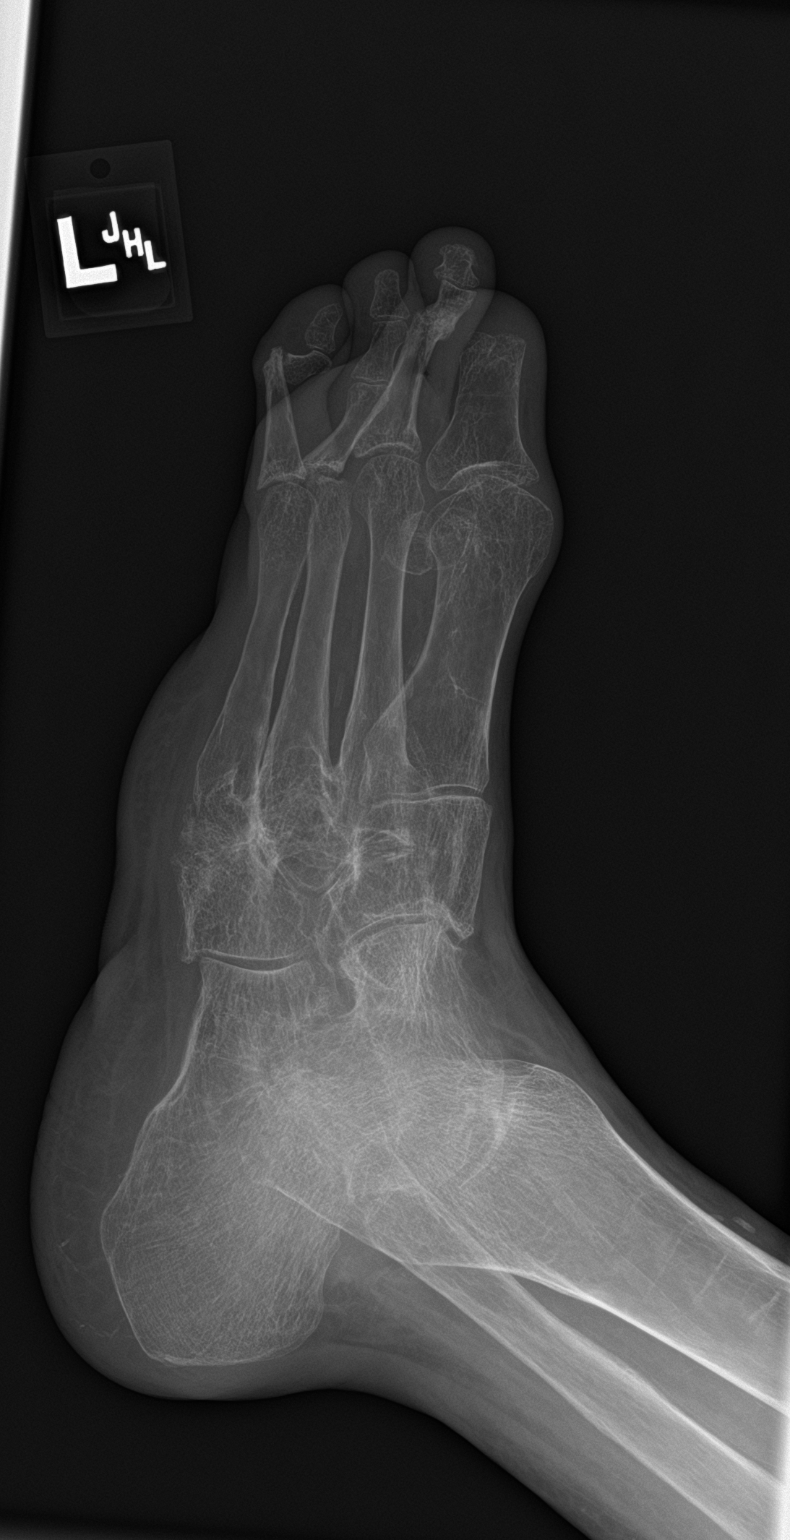

[foot lat]
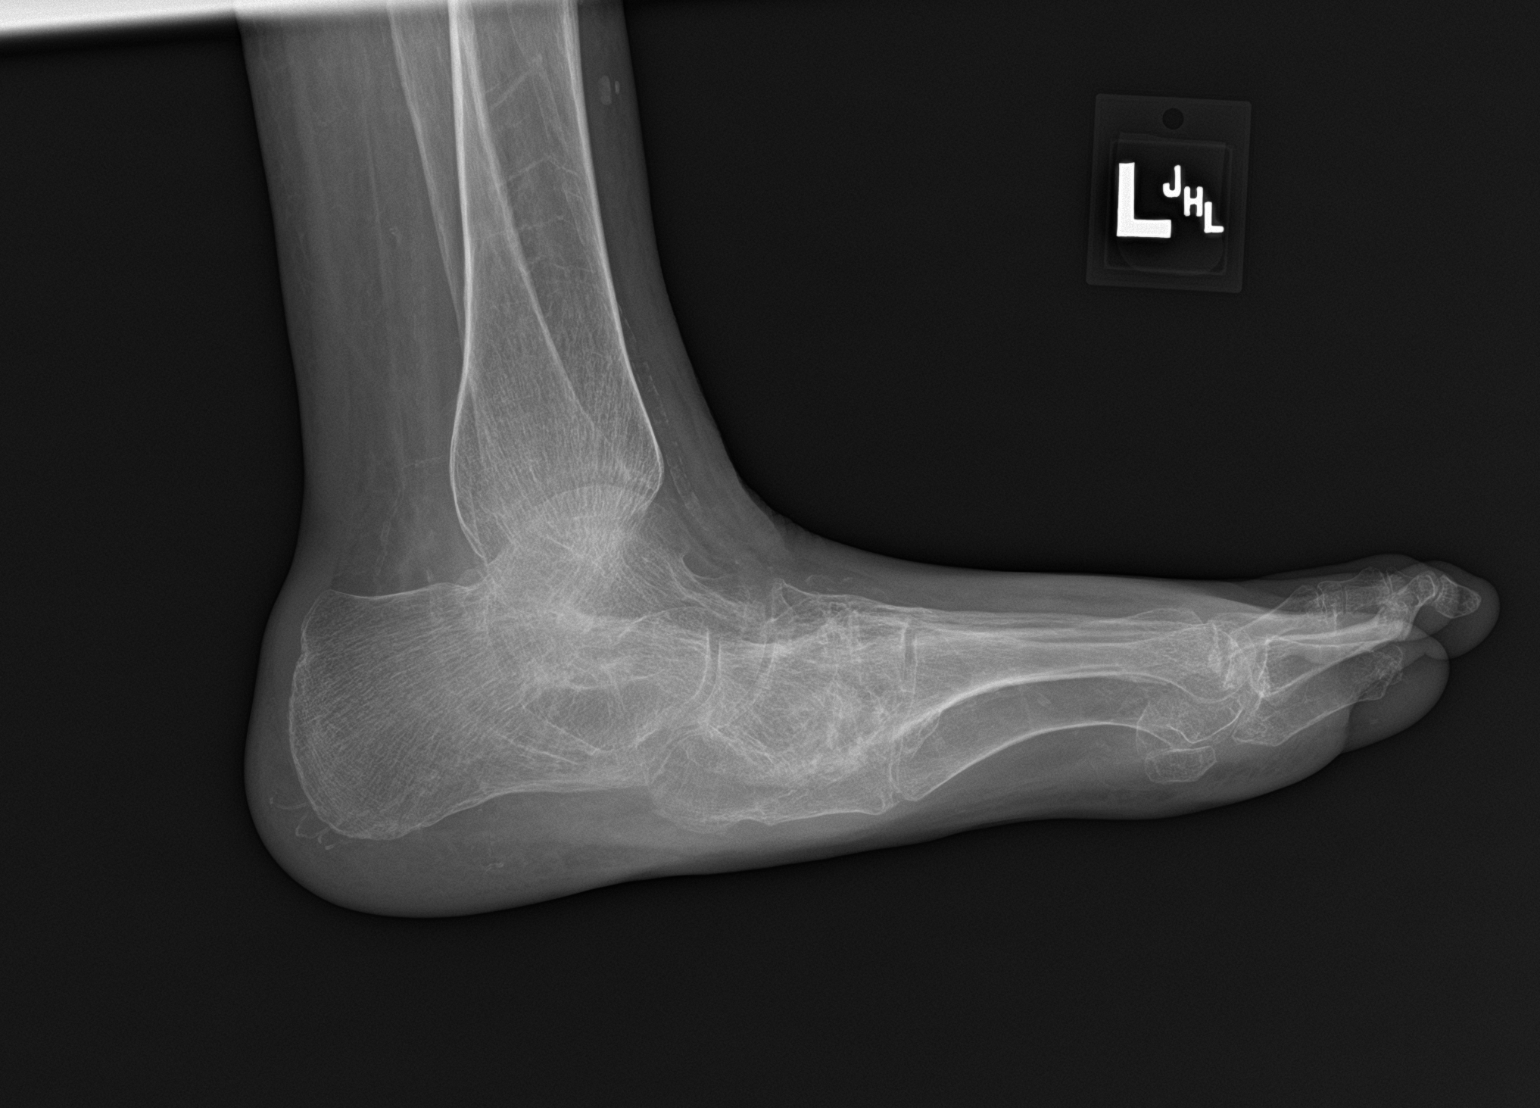

[3 of 3 positions shown; findings below may reference images not displayed]

FINDINGS: No fracture or bone lesion.

Status post resection of the fifth metatarsal through fifth toe and
the distal phalanx of the great toe.

Bony fusion across the PIP joint of the second toe. Chronic
resorption of the head of the proximal phalanx of the fourth toe
with the middle phalanx subluxed in a plantar/medial direction.
Chronic remodeling of the third MTP joint.

Flattened plantar arch. Mid foot arthropathic changes consistent
with chronic neuropathic osteoarthropathy.

There are no areas of new bone resorption to suggest active
osteomyelitis.

Soft tissues are unremarkable.
IMPRESSION: 1. No fracture or acute finding.
2. No findings to suggest active osteomyelitis. There are stable
changes from previous osteomyelitis as well as prior surgical
resections and changes of neuropathic osteoarthropathy.

Subluxed

## 2020-06-06 NOTE — Progress Notes (Addendum)
SAMARIAH, HOKENSON ([id number]) Visit Report for 06/03/2020 Arrival Information Details Patient Name: Date of Service: [name] 06/03/2020 12:30 PM Medical Record Number: [id number] Patient Account Number: [id number] Date of Birth/Sex: Treating RN: [birth year] (50 y.o. F) Johna Roles, [name] Primary Care Helga Asbury: [name], [name] Other Clinician: Referring Eathan Groman: Treating Cliffton Spradley/Extender: Worthy Keeler MO REIRA, [name] Weeks in Treatment: 52 Visit Information History Since Last Visit Added or deleted any medications: No Patient Arrived: Wheel Chair Any new allergies or adverse reactions: No Arrival Time: 12:20 Had a fall or experienced change in No Accompanied By: self activities of daily living that may affect Transfer Assistance: None risk of falls: Patient Identification Verified: Yes Signs or symptoms of abuse/neglect since last visito No Secondary Verification Process Completed: Yes Hospitalized since last visit: No Patient Requires Transmission-Based Precautions: No Implantable device outside of the clinic excluding No Patient Has Alerts: No cellular tissue based products placed in the center since last visit: Has Dressing in Place as Prescribed: Yes Pain Present Now: No Electronic Signature(s) Signed: 06/03/2020 12:45:34 PM By: Sandre Kitty Entered By: Sandre Kitty on 06/03/2020 12:22:40 -------------------------------------------------------------------------------- Clinic Level of Care Assessment Details Patient Name: Date of Service: [name] 06/03/2020 12:30 PM Medical Record Number: [id number] Patient Account Number: [id number] Date of Birth/Sex: Treating RN: Nov 02, 1970 (50 y.o. [name] Primary Care Clearence Vitug: [name], [name] Other Clinician: Referring Curran Lenderman: Treating Dut[place]ss Crosland/Extender: Worthy Keeler MO REIRA, [name] Weeks in Treatment: 38 Clinic Level of Care Assessment Items TOOL 4 Quantity Score _0  -  0 Use when only an EandM is performed on FOLLOW-UP visit ASSESSMENTS - Nursing Assessment / Reassessment X- 1 10 Reassessment of Co-morbidities (includes updates in patient status) X- 1 5 Reassessment of Adherence to Treatment Plan ASSESSMENTS - Wound and Skin A ssessment / Reassessment X - Simple Wound Assessment / Reassessment - one wound 1 5 _1  - 0 Complex Wound Assessment / Reassessment - multiple wounds _2  - 0 Dermatologic / Skin Assessment (not related to wound area) ASSESSMENTS - Focused Assessment _3  - 0 Circumferential Edema Measurements - multi extremities _4  - 0 Nutritional Assessment / Counseling / Intervention X- 1 5 Lower Extremity Assessment (monofilament, tuning fork, pulses) _5  - 0 Peripheral Arterial Disease Assessment (using hand held doppler) ASSESSMENTS - Ostomy and/or Continence Assessment and Care _6  - 0 Incontinence Assessment and Management _7  - 0 Ostomy Care Assessment and Management (repouching, etc.) PROCESS - Coordination of Care X - Simple Patient / Family Education for ongoing care 1 15 _8  - 0 Complex (extensive) Patient / Family Education for ongoing care X- 1 10 Staff obtains Programmer, systems, Records, T Results / Process Orders est X- 1 10 Staff telephones HHA, Nursing Homes / Clarify orders / etc _9  - 0 Routine Transfer to another Facility (non-emergent condition) _10  - 0 Routine Hospital Admission (non-emergent condition) _11  - 0 New Admissions / Biomedical engineer / Ordering NPWT Apligraf, etc. , _12  - 0 Emergency Hospital Admission (emergent condition) X- 1 10 Simple Discharge Coordination _13  - 0 Complex (extensive) Discharge Coordination PROCESS - Special Needs _14  - 0 Pediatric / Minor Patient Management _15  - 0 Isolation Patient Management _16  - 0 Hearing / Language / Visual special needs _17  - 0 Assessment of Community assistance (transportation, D/C planning, etc.) _18  - 0 Additional assistance / Altered mentation _19  -  0 Support Surface(s) Assessment (bed, cushion, seat, etc.) INTERVENTIONS - Wound Cleansing / Measurement X - Simple Wound  Cleansing - one wound 1 5 _0  - 0 Complex Wound Cleansing - multiple wounds X- 1 5 Wound Imaging (photographs - any number of wounds) _1  - 0 Wound Tracing (instead of photographs) X- 1 5 Simple Wound Measurement - one wound _2  - 0 Complex Wound Measurement - multiple wounds INTERVENTIONS - Wound Dressings X - Small Wound Dressing one or multiple wounds 1 10 _3  - 0 Medium Wound Dressing one or multiple wounds _4  - 0 Large Wound Dressing one or multiple wounds X- 1 5 Application of Medications - topical <[id number]>_1<\/[id number]>_4  - 0 Application of Medications - injection INTERVENTIONS - Miscellaneous _6  - 0 External ear exam _7  - 0 Specimen Collection (cultures, biopsies, blood, body fluids, etc.) _8  - 0 Specimen(s) / Culture(s) sent or taken to Lab for analysis _9  - 0 Patient Transfer (multiple staff / Civil Service fast streamer / Similar devices) _10  - 0 Simple Staple / Suture removal (25 or less) _11  - 0 Complex Staple / Suture removal (26 or more) _12  - 0 Hypo / Hyperglycemic Management (close monitor of Blood Glucose) _13  - 0 Ankle / Brachial Index (ABI) - do not check if billed separately X- 1 5 Vital Signs Has the patient been seen at the hospital within the last three years: Yes Total Score: 105 Level Of Care: New/Established - Level 3 Electronic Signature(s) Signed: 06/03/2020 5:05:02 PM By: Baruch Gouty RN, BSN Entered By: Baruch Gouty on 06/03/2020 12:55:57 -------------------------------------------------------------------------------- Encounter Discharge Information Details Patient Name: Date of Service: Pike [name] 06/03/2020 12:30 PM Medical Record Number: [id number] Patient Account Number: [id number] Date of Birth/Sex: Treating RN: [birth year] (50 y.o. [name] Primary Care Rey Dansby: [name], [name] Other Clinician: Referring Burnette Sautter: Treating  Damoney Julia/Extender: Worthy Keeler MO REIRA, [name] Weeks in Treatment: 49 Encounter Discharge Information Items Discharge Condition: Stable Ambulatory Status: Wheelchair Discharge Destination: Home Transportation: Private Auto Accompanied By: self Schedule Follow-up Appointment: Yes Clinical Summary of Care: Electronic Signature(s) Signed: 06/03/2020 5:05:25 PM By: Deon Pilling Entered By: Deon Pilling on 06/03/2020 14:36:46 -------------------------------------------------------------------------------- Lower Extremity Assessment Details Patient Name: Date of Service: [name] 06/03/2020 12:30 PM Medical Record Number: [id number] Patient Account Number: [id number] Date of Birth/Sex: Treating RN: 09-08-1970 (50 y.o. [name], [name] Primary Care Kenedy Haisley: [name], [name] Other Clinician: Referring Sariah Henkin: Treating Oluwasemilore Bahl/Extender: Worthy Keeler MO REIRA, [name] Weeks in Treatment: 24 Edema Assessment Assessed: [Left: No] [Right: Yes] Edema: [Left: N] [Right: o] Calf Left: Right: Point of Measurement: From Medial Instep 27 cm Ankle Left: Right: Point of Measurement: From Medial Instep 19 cm Vascular Assessment Pulses: Dorsalis Pedis Palpable: [Right:Yes] Posterior Tibial Palpable: [Right:Yes] Electronic Signature(s) Signed: 06/06/2020 5:34:21 PM By: Rhae Hammock RN Entered By: Rhae Hammock on 06/03/2020 12:44:13 -------------------------------------------------------------------------------- Multi-Disciplinary Care Plan Details Patient Name: Date of Service: [name] 06/03/2020 12:30 PM Medical Record Number: [id number] Patient Account Number: [id number] Date of Birth/Sex: Treating RN: Jun 23, 1970 (50 y.o. [name] Primary Care Clarissia Mckeen: [name], [name] Other Clinician: Referring Angala Hilgers: Treating Trice Aspinall/Extender: Andrey Campanile, [name] Weeks in Treatment: 50 [facility]  reviewed with physician Active Inactive Nutrition Nursing Diagnoses: Imbalanced nutrition Impaired glucose control: actual or potential Potential for alteratiion in Nutrition/Potential for imbalanced nutrition Goals: Patient/caregiver agrees to and verbalizes understanding of need to use nutritional supplements and/or vitamins as prescribed Date Initiated: 09/03/2018 Date Inactivated: 10/07/2018 Target Resolution Date: 10/01/2018 Goal Status: Met Patient/caregiver will  maintain therapeutic glucose control Date Initiated: 09/03/2018 Target Resolution Date: 06/10/2020 Goal Status: Active Interventions: Provide education on elevated blood sugars and impact on wound healing Treatment Activities: Patient referred to Primary Care Physician for further nutritional evaluation : 09/03/2018 Notes: Wound/Skin Impairment Nursing Diagnoses: Impaired tissue integrity Knowledge deficit related to ulceration/compromised skin integrity Goals: Patient/caregiver will verbalize understanding of skin care regimen Date Initiated: 09/03/2018 Target Resolution Date: 06/17/2020 Goal Status: Active Ulcer/skin breakdown will have a volume reduction of 30% by week 4 Date Initiated: 09/03/2018 Date Inactivated: 10/07/2018 Target Resolution Date: 10/01/2018 Goal Status: Unmet Unmet Reason: COMORBITIES Ulcer/skin breakdown will have a volume reduction of 50% by week 8 Date Initiated: 10/07/2018 Date Inactivated: 11/03/2018 Target Resolution Date: 10/31/2018 Goal Status: Unmet Unmet Reason: Osteomyelitis Interventions: Assess patient/caregiver ability to obtain necessary supplies Assess patient/caregiver ability to perform ulcer/skin care regimen upon admission and as needed Assess ulceration(s) every visit Provide education on ulcer and skin care Treatment Activities: Skin care regimen initiated : 09/03/2018 Topical wound management initiated : 09/03/2018 Notes: Electronic Signature(s) Signed: 06/03/2020 5:05:02 PM By:  Baruch Gouty RN, BSN Entered By: Baruch Gouty on 06/03/2020 12:54:55 -------------------------------------------------------------------------------- Pain Assessment Details Patient Name: Date of Service: [name] 06/03/2020 12:30 PM Medical Record Number: [id number] Patient Account Number: [id number] Date of Birth/Sex: Treating RN: 10-Feb-1971 (50 y.o. [name] Primary Care Dorleen Kissel: [name], [name] Other Clinician: Referring Madlynn Lundeen: Treating Adaja Wander/Extender: Worthy Keeler MO REIRA, [name] Weeks in Treatment: 96 Active Problems Location of Pain Severity and Description of Pain Patient Has Paino No Site Locations Pain Management and Medication Current Pain Management: Electronic Signature(s) Signed: 06/03/2020 12:45:34 PM By: Sandre Kitty Signed: 06/03/2020 5:05:02 PM By: Baruch Gouty RN, BSN Entered By: Sandre Kitty on 06/03/2020 12:23:22 -------------------------------------------------------------------------------- Patient/Caregiver Education Details Patient Name: Date of Service: [name] 3/4/2022andnbsp12:30 PM Medical Record Number: [id number] Patient Account Number: [id number] Date of Birth/Gender: Treating RN: 1970-11-02 (50 y.o. [name] Primary Care Physician: [name], [name] Other Clinician: Referring Physician: Treating Physician/Extender: Worthy Keeler MO REIRA, [name] Weeks in Treatment: 5 Education Assessment Education Provided To: Patient Education Topics Provided Elevated Blood Sugar/ Impact on Healing: Methods: Explain/Verbal Responses: Reinforcements needed, State content correctly Wound/Skin Impairment: Methods: Explain/Verbal Responses: Reinforcements needed, State content correctly Electronic Signature(s) Signed: 06/03/2020 5:05:02 PM By: Baruch Gouty RN, BSN Entered By: Baruch Gouty on 06/03/2020  12:55:26 -------------------------------------------------------------------------------- Wound Assessment Details Patient Name: Date of Service: [place], [name] 06/03/2020 12:30 PM Medical Record Number: [id number] Patient Account Number: [id number] Date of Birth/Sex: Treating RN: 08/31/1970 (50 y.o. [name] Primary Care Gabor Lusk: [name], [name] Other Clinician: Referring Zanae Kuehnle: Treating Aftyn Nott/Extender: Worthy Keeler MO REIRA, [name] Weeks in Treatment: 2 Wound Status Wound Number: [id number] Primary Diabetic Wound/Ulcer of the Lower Extremity Etiology: Wound Location: Right, Medial Foot Wound Open Wounding Event: Gradually Appeared Status: Date Acquired: 08/04/2018 Comorbid Cataracts, Chronic sinus problems/congestion, Anemia, Sleep Weeks Of Treatment: 91 History: Apnea, Deep Vein Thrombosis, Hypertension, Peripheral Arterial Clustered Wound: Yes Disease, Type I Diabetes, Osteoarthritis, Osteomyelitis, Neuropathy, Seizure Disorder Photos Wound Measurements Length: (cm) 0.4 Width: (cm) 0.4 Depth: (cm) 0.1 Area: (cm) 0.126 Volume: (cm) 0.013 Wound Description Classification: Grade 4 Wound Margin: Flat and Intact Exudate Amount: Medium Exudate Type: Serosanguineous Exudate Color: red, brown Foul Odor After Cleansing: Slough/Fibrino % Reduction in Area: 99.8% % Reduction in Volume: 99.9% Epithelialization: Small (1-33%) Tunneling: No Undermining: No  No Yes Wound Bed Granulation Amount: Large (67-100%) Exposed Structure Granulation Quality: Pink Fascia Exposed: No Necrotic Amount: None Present (0%) Fat Layer (Subcutaneous Tissue) Exposed: Yes Tendon Exposed: No Muscle Exposed: No Joint Exposed: No Bone Exposed: No Treatment Notes Wound #43 (Foot) Wound Laterality: Right, Medial Cleanser Wound Cleanser Discharge Instruction: Cleanse the wound with wound cleanser or normal saline prior to applying a clean dressing using gauze sponges,  not tissue or cotton balls. Peri-Wound Care Topical Primary Dressing KerraCel Ag Gelling Fiber Dressing, 2x2 in (silver alginate) Discharge Instruction: Apply silver alginate to wound bed in clinic. pt may use silver honey spray at home Secondary Dressing Woven Gauze Sponge, Non-Sterile 4x4 in Discharge Instruction: Apply over primary dressing as directed. Secured With Elastic Bandage 4 inch (ACE bandage) Discharge Instruction: Secure with ACE bandage as directed. Kerlix Roll Sterile, 4.5x3.1 (in/yd) Discharge Instruction: Secure with Kerlix as directed. Paper Tape, 2x10 (in/yd) Discharge Instruction: Secure dressing with tape as directed. Compression Wrap Compression Stockings Add-Ons Electronic Signature(s) Signed: 06/07/2020 11:11:03 AM By: Sandre Kitty Signed: 06/07/2020 6:01:57 PM By: Baruch Gouty RN, BSN Previous Signature: 06/03/2020 5:05:02 PM Version By: Baruch Gouty RN, BSN Previous Signature: 06/06/2020 5:34:21 PM Version By: Rhae Hammock RN Previous Signature: 06/03/2020 12:45:34 PM Version By: Sandre Kitty Entered By: Sandre Kitty on 06/07/2020 10:58:36 -------------------------------------------------------------------------------- Vitals Details Patient Name: Date of Service: [name] 06/03/2020 12:30 PM Medical Record Number: [id number] Patient Account Number: [id number] Date of Birth/Sex: Treating RN: 10-Sep-1970 (50 y.o. [name] Primary Care Cashel Bellina: [name], [name] Other Clinician: Referring Kerney Hopfensperger: Treating Ellyssa Zagal/Extender: Worthy Keeler MO REIRA, [name] Weeks in Treatment: 15 Vital Signs Time Taken: 12:22 Temperature (F): 99.0 Height (in): 67 Pulse (bpm): 85 Weight (lbs): 125 Respiratory Rate (breaths/min): 17 Body Mass Index (BMI): 19.6 Blood Pressure (mmHg): 174/100 Capillary Blood Glucose (mg/dl): 275 Reference Range: 80 - 120 mg / dl Electronic Signature(s) Signed: 06/03/2020 12:45:34 PM By:  Sandre Kitty Entered By: Sandre Kitty on 06/03/2020 12:23:16

## 2020-06-18 IMAGING — MR MR [PERSON_NAME] LOW W/O CM*R*
4 of 6 series · 17 of 40 positions shown · non-contrast
Comparison: X-ray 10/29/2018

CLINICAL DATA: Diabetic foot ulcer

EXAM:
MRI OF LOWER RIGHT EXTREMITY WITHOUT CONTRAST; MRI OF THE RIGHT
ANKLE WITHOUT CONTRAST
TECHNIQUE: Multiplanar, multisequence MR imaging of the right tibia fibula and
right hindfoot was performed. No intravenous contrast was
administered.

[Series 4: T1 · axial · 5.0mm · 0.31mm/px · z∈[-266,+52]mm · 3 of 67 slices shown (1 of 2)]
[im 9/67]
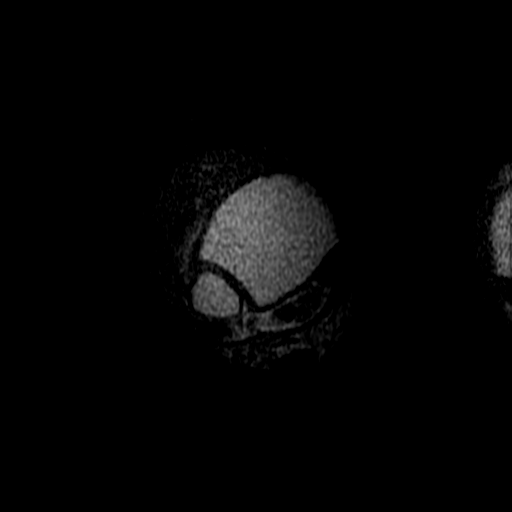
[im 34/67]
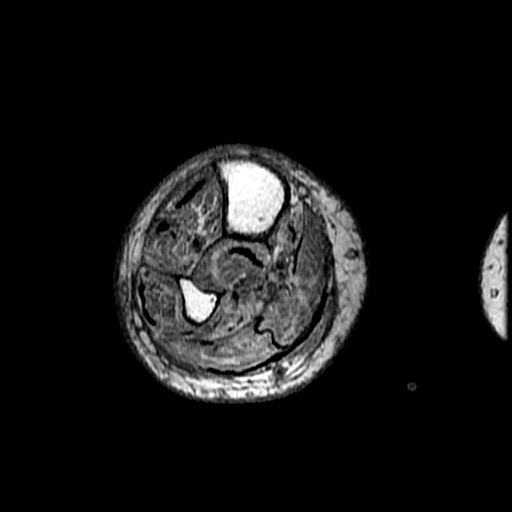
[im 58/67]
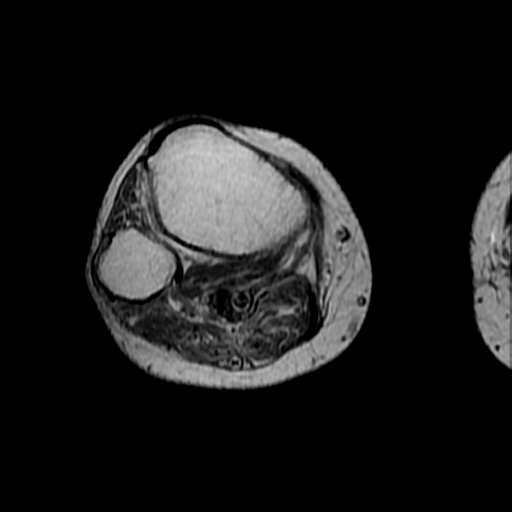

[Series 6: T1 · coronal · 4.0mm · 0.88mm/px · 3 of 28 slices shown (2 of 2)]
[im 1/28]
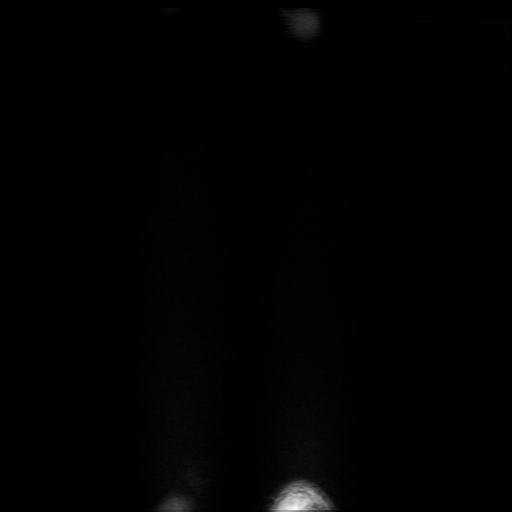
[im 19/28]
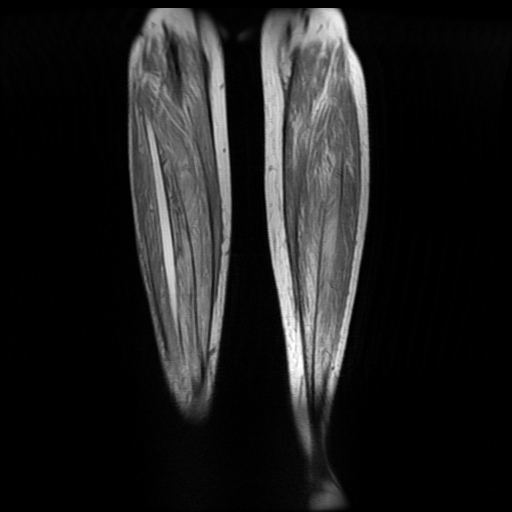
[im 28/28]
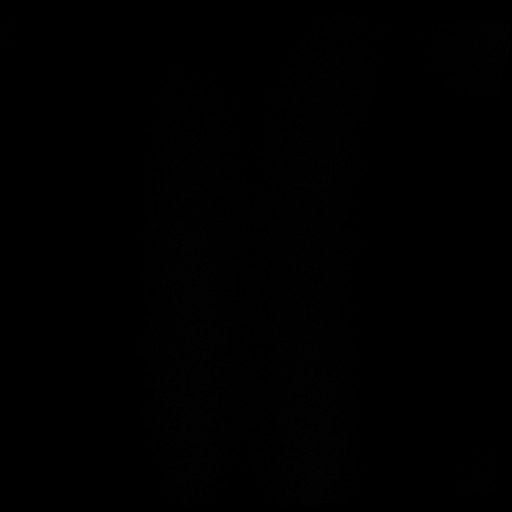

[Series 7: T2 fat-sat · axial · 5.0mm · 0.31mm/px · z∈[-318,+110]mm · 8 of 67 slices shown (1 of 2)]
[im 1/67]
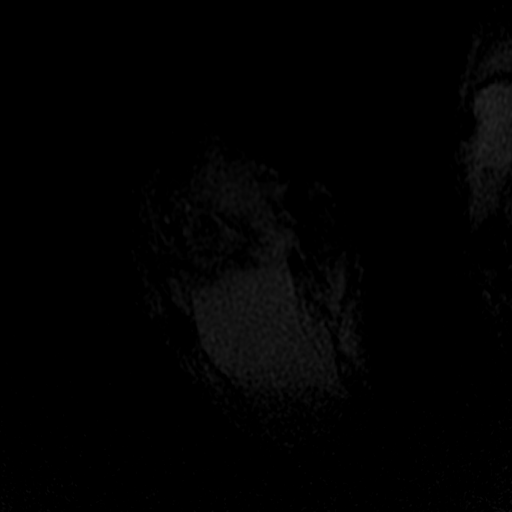
[im 8/67]
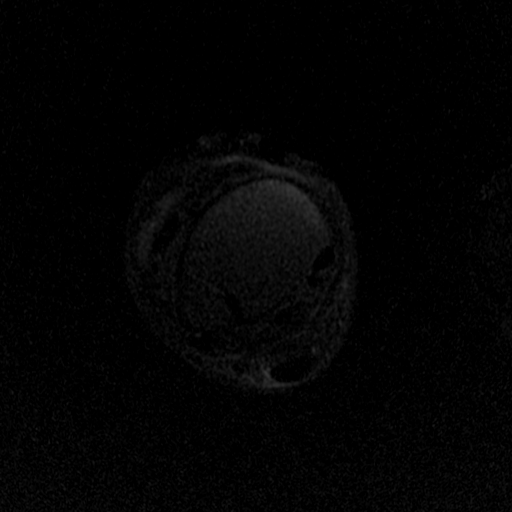
[im 23/67]
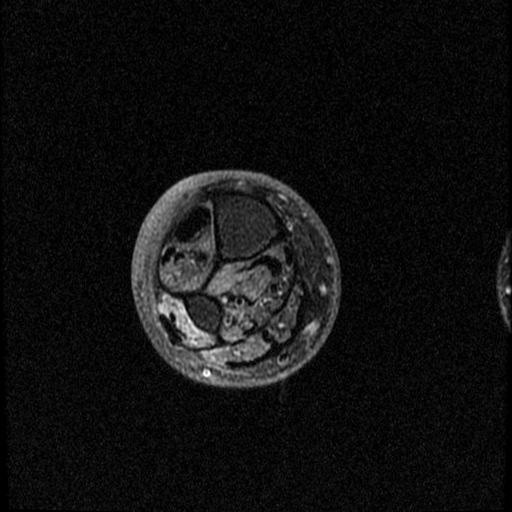
[im 30/67]
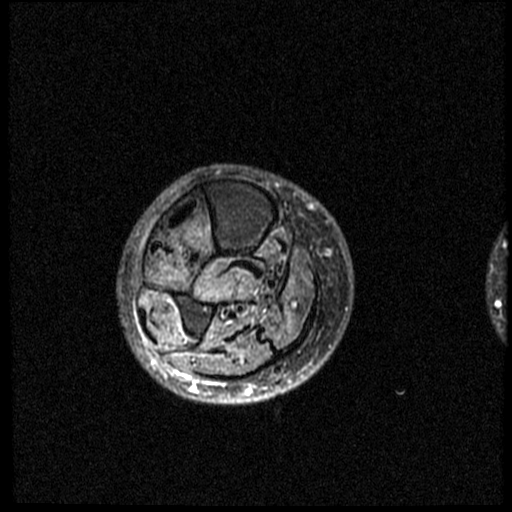
[im 37/67]
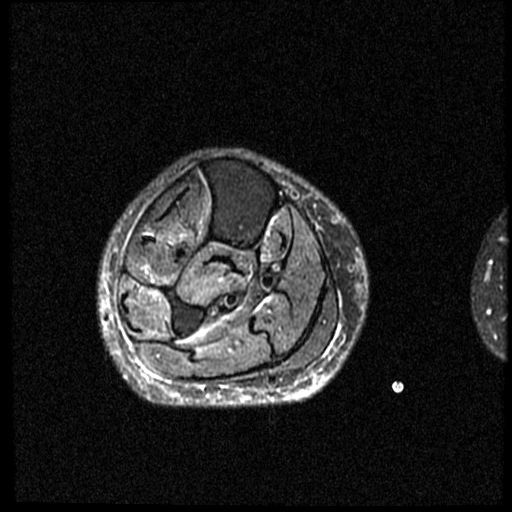
[im 45/67]
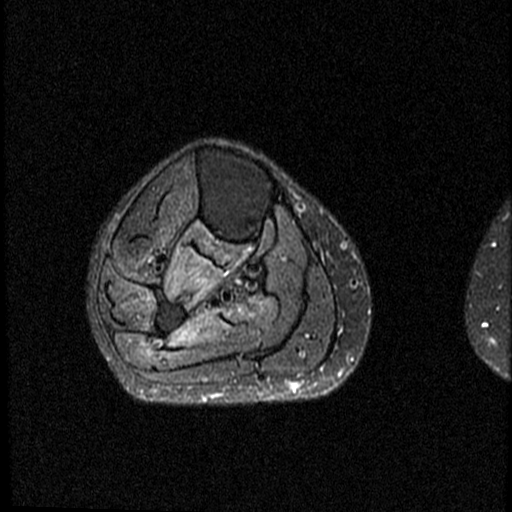
[im 59/67]
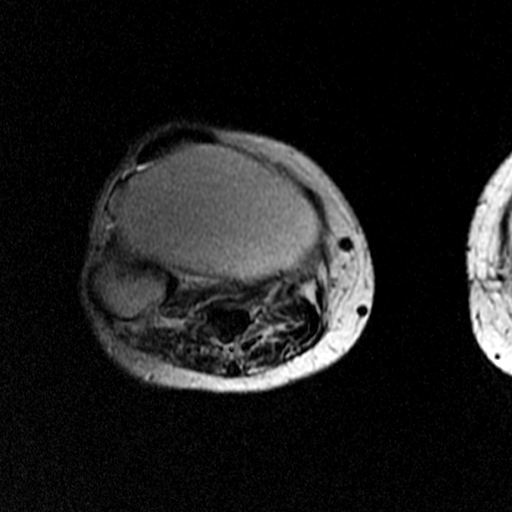
[im 67/67]
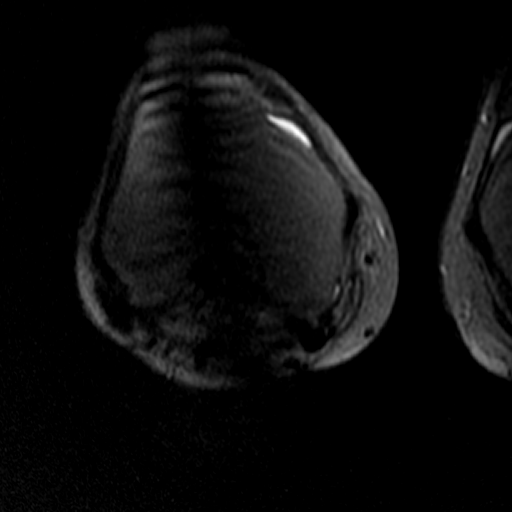

[Series 12: T2 fat-sat · sagittal · 5.0mm · 0.80mm/px · 3 of 20 slices shown (2 of 2)]
[im 1/20]
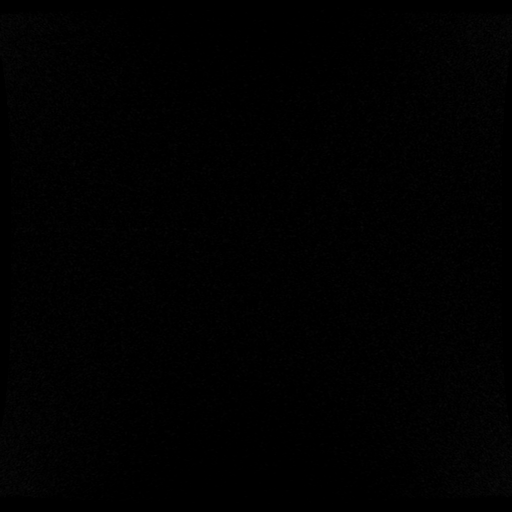
[im 10/20]
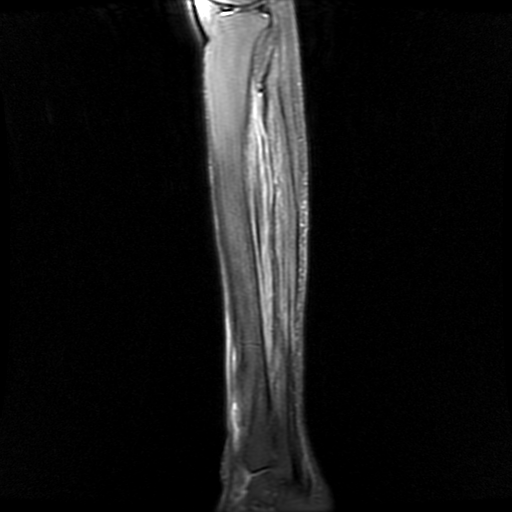
[im 20/20]
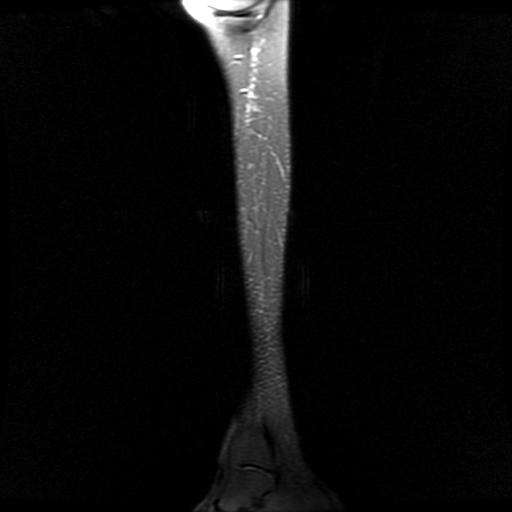

[17 of 40 positions shown; findings below may reference images not displayed]

FINDINGS: Thick-walled fluid collection overlying the anterior compartment
musculature of the distal aspect of the right lower extremity
measuring approximately 1.5 cm AP x 5 cm TR by 9 cm CC. There is
extensive foci of internal susceptibility within this collection
suggesting gas. This collection appears contiguous with a overlying
skin defect along the anterior aspect of the distal tibia (series 3,
image 10). There is a large soft tissue defect/ulceration with
packing material overlying the dorsomedial aspect of the midfoot and
hindfoot with the base closely approximating the overlying bone.

Extensive bone marrow edema with confluent low T1 signal changes
throughout the osseous structures of the midfoot (series 7, image
26-28) compatible with acute osteomyelitis. There is osseous
fragmentation within this region. Osseous structures involved
include the anteromedial aspect of the calcaneus and the majority of
the cuboid, navicular, and cuneiforms, and the bases of the first
through fourth metatarsals. There is also marrow edema with loss of
cortical distinctness at the anterior aspect of the distal medial
malleolus.

There is has small heterogeneous tibiotalar joint effusion. Trace
subtalar joint effusion.

Patient is status post prior first-fourth transmetatarsal
amputations and fifth ray resection.

Posteromedial ankle tendons are grossly intact. Mild tenosynovial
fluid within the posterior tibialis tendon sheath. Peroneal tendons
grossly intact. Anterior ankle tendons are mildly displaced by large
overlying soft tissue abscess. Achilles tendon grossly intact.

Dedicated images through the length of the right tibia and fibula
reveal diffuse T2 hyperintensity throughout all of the lower leg
musculature. There is striated fatty infiltration throughout these
muscles as well suggesting sequela of chronic denervation. Trace
deep fascial fluid, not out of proportion to the degree of
subcutaneous fluid.
IMPRESSION: 1. Large soft tissue abscess overlying the anterior compartment
musculature of the distal right lower extremity measuring
approximately 9 x 5 x 1.5 cm.
2. Extensive bone destruction with marrow edema and confluent low T1
signal changes throughout the osseous structures of the midfoot and
portions of the hindfoot compatible with acute osteomyelitis.
3. Bone marrow edema with loss of cortical distinctness at the
anterior aspect of the distal medial malleolus suspicious for
osteomyelitis.
4. Small tibiotalar and trace subtalar joint effusions suspicious
for septic arthritis.
5. Diffuse T2 hyperintensity throughout the lower leg musculature
suggesting a non-specific myositis which may be infectious and or
reflect sequela of denervation.
6. Mild posterior tibialis tenosynovitis.
7. Prior first-fourth transmetatarsal amputations and fifth ray
resection.

These results will be called to the ordering clinician or
representative by the Radiologist Assistant, and communication
documented in the PACS or zVision Dashboard.

## 2020-06-18 IMAGING — MR MR ANKLE*R* W/O CM
4 of 5 series · 19 of 40 positions shown · non-contrast
Comparison: X-ray 10/29/2018

CLINICAL DATA: Diabetic foot ulcer

EXAM:
MRI OF LOWER RIGHT EXTREMITY WITHOUT CONTRAST; MRI OF THE RIGHT
ANKLE WITHOUT CONTRAST
TECHNIQUE: Multiplanar, multisequence MR imaging of the right tibia fibula and
right hindfoot was performed. No intravenous contrast was
administered.

[Series 3: T2 fat-sat · axial · 3.0mm · 0.31mm/px · z∈[-143,-6]mm · 10 of 40 slices shown (1 of 2)]
[im 1/40]
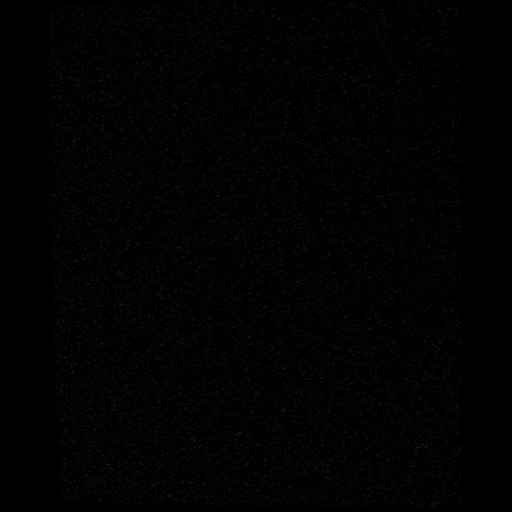
[im 4/40]
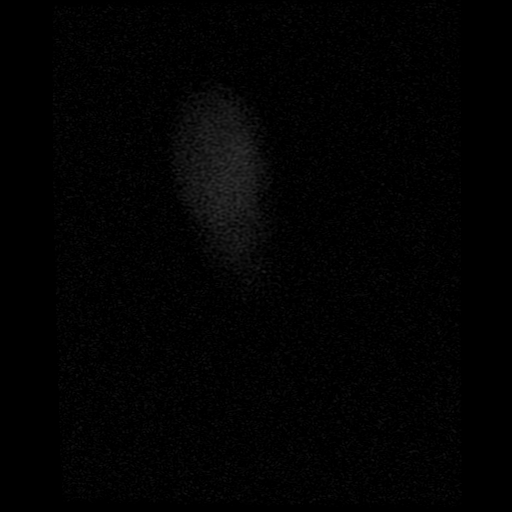
[im 8/40]
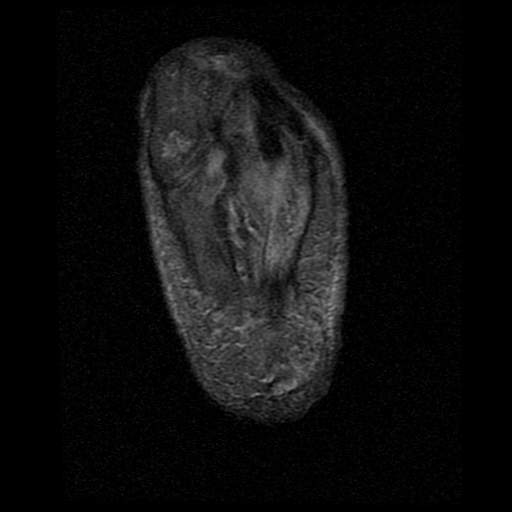
[im 12/40]
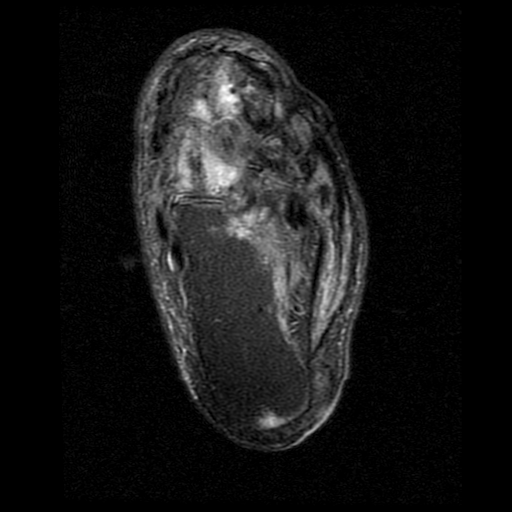
[im 16/40]
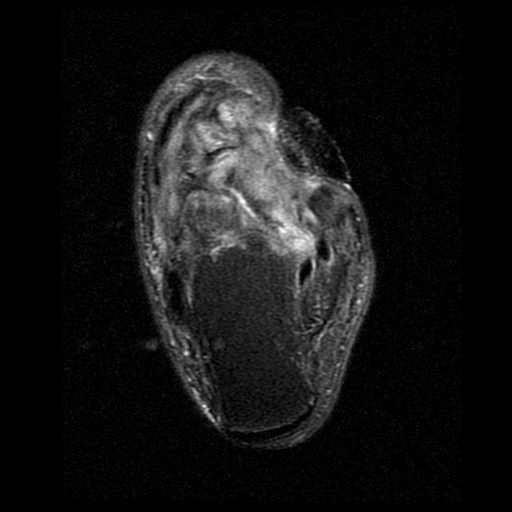
[im 20/40]
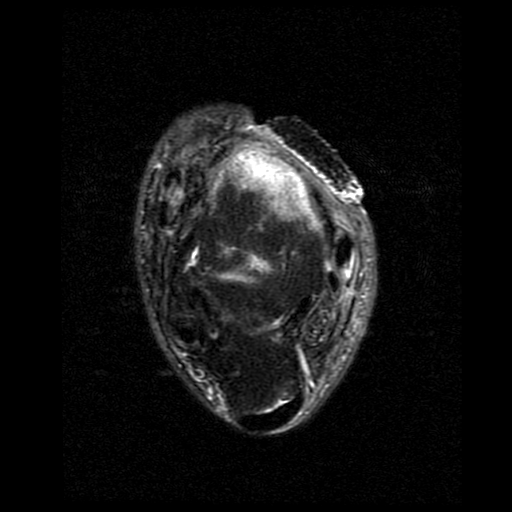
[im 24/40]
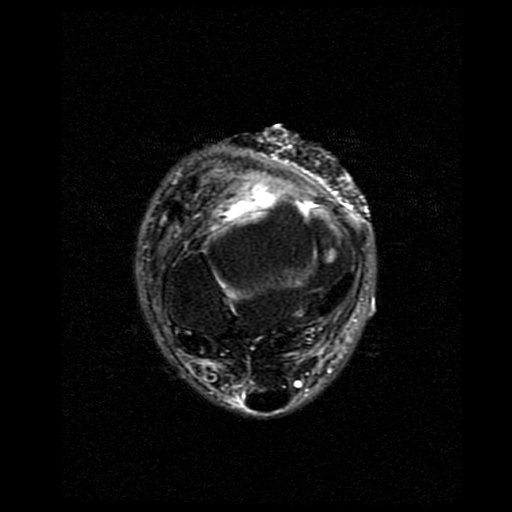
[im 28/40]
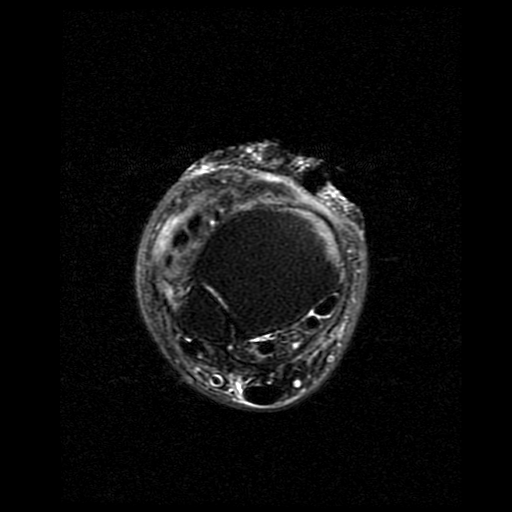
[im 32/40]
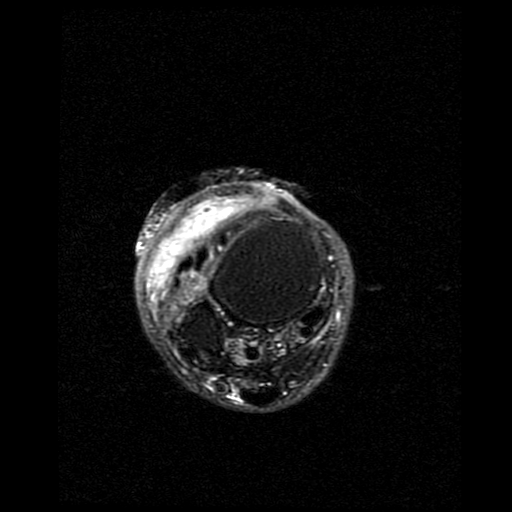
[im 36/40]
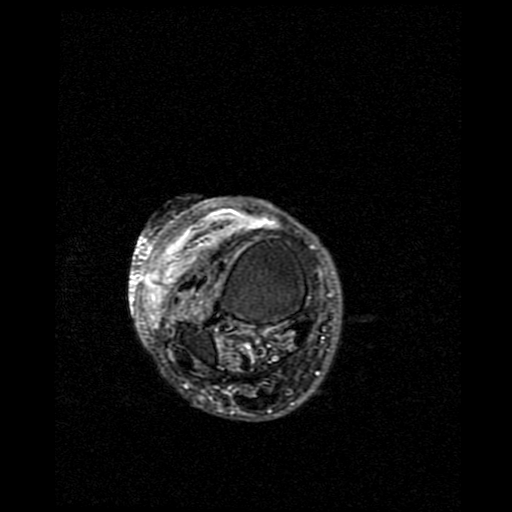

[Series 4: T2 fat-sat · coronal · 3.0mm · 0.31mm/px · 3 of 38 slices shown (2 of 2)]
[im 5/38]
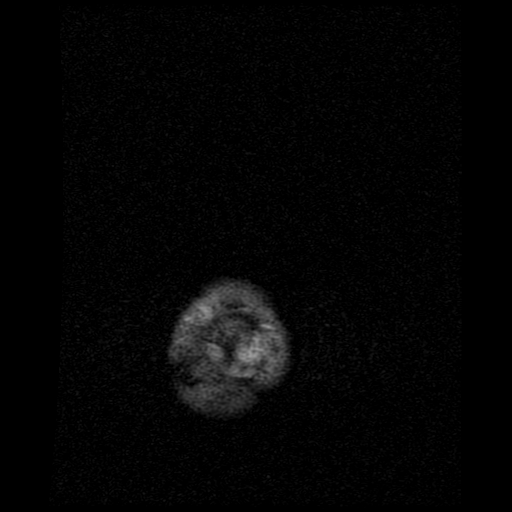
[im 19/38]
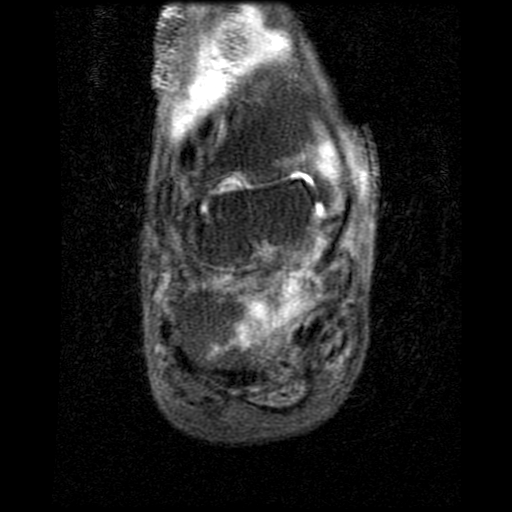
[im 33/38]
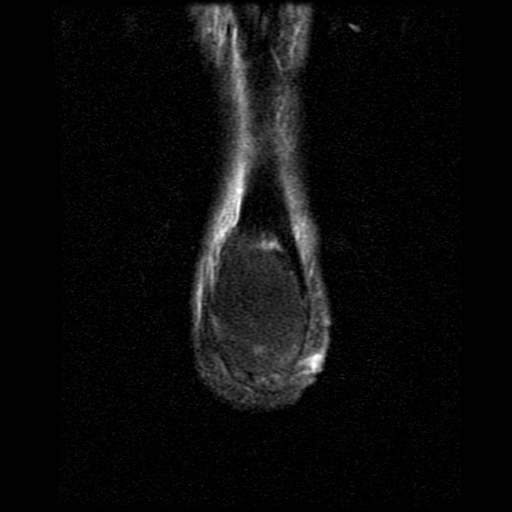

[Series 6: T1 · sagittal · 4.0mm · 0.66mm/px · 3 of 22 slices shown (1 of 2)]
[im 1/22]
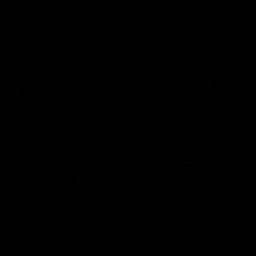
[im 11/22]
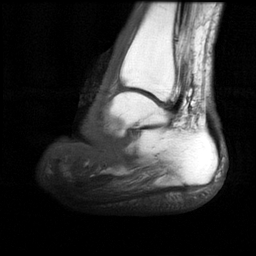
[im 22/22]
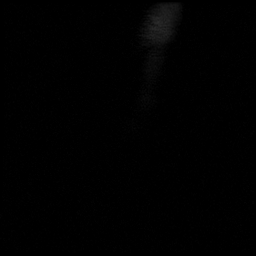

[Series 7: T1 · axial · 3.0mm · 0.31mm/px · z∈[-128,-10]mm · 3 of 40 slices shown (2 of 2)]
[im 5/40]
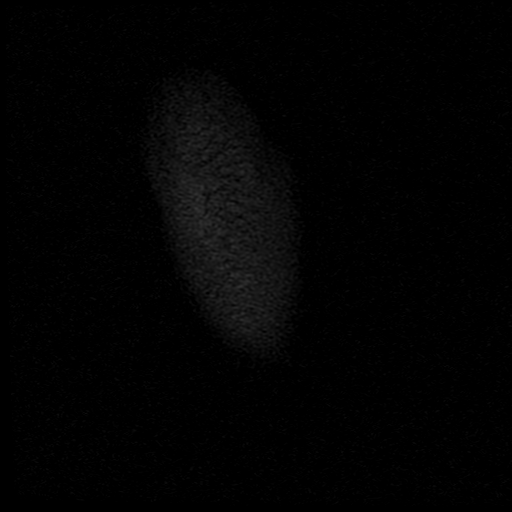
[im 22/40]
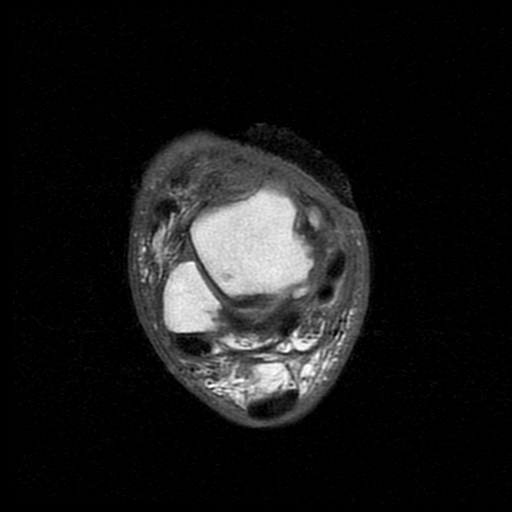
[im 35/40]
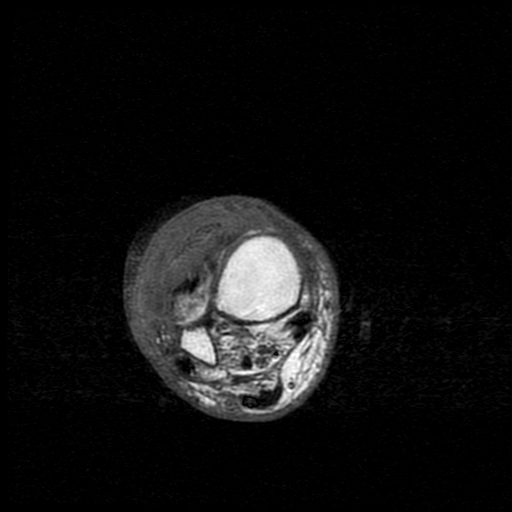

[19 of 40 positions shown; findings below may reference images not displayed]

FINDINGS: Thick-walled fluid collection overlying the anterior compartment
musculature of the distal aspect of the right lower extremity
measuring approximately 1.5 cm AP x 5 cm TR by 9 cm CC. There is
extensive foci of internal susceptibility within this collection
suggesting gas. This collection appears contiguous with a overlying
skin defect along the anterior aspect of the distal tibia (series 3,
image 10). There is a large soft tissue defect/ulceration with
packing material overlying the dorsomedial aspect of the midfoot and
hindfoot with the base closely approximating the overlying bone.

Extensive bone marrow edema with confluent low T1 signal changes
throughout the osseous structures of the midfoot (series 7, image
26-28) compatible with acute osteomyelitis. There is osseous
fragmentation within this region. Osseous structures involved
include the anteromedial aspect of the calcaneus and the majority of
the cuboid, navicular, and cuneiforms, and the bases of the first
through fourth metatarsals. There is also marrow edema with loss of
cortical distinctness at the anterior aspect of the distal medial
malleolus.

There is has small heterogeneous tibiotalar joint effusion. Trace
subtalar joint effusion.

Patient is status post prior first-fourth transmetatarsal
amputations and fifth ray resection.

Posteromedial ankle tendons are grossly intact. Mild tenosynovial
fluid within the posterior tibialis tendon sheath. Peroneal tendons
grossly intact. Anterior ankle tendons are mildly displaced by large
overlying soft tissue abscess. Achilles tendon grossly intact.

Dedicated images through the length of the right tibia and fibula
reveal diffuse T2 hyperintensity throughout all of the lower leg
musculature. There is striated fatty infiltration throughout these
muscles as well suggesting sequela of chronic denervation. Trace
deep fascial fluid, not out of proportion to the degree of
subcutaneous fluid.
IMPRESSION: 1. Large soft tissue abscess overlying the anterior compartment
musculature of the distal right lower extremity measuring
approximately 9 x 5 x 1.5 cm.
2. Extensive bone destruction with marrow edema and confluent low T1
signal changes throughout the osseous structures of the midfoot and
portions of the hindfoot compatible with acute osteomyelitis.
3. Bone marrow edema with loss of cortical distinctness at the
anterior aspect of the distal medial malleolus suspicious for
osteomyelitis.
4. Small tibiotalar and trace subtalar joint effusions suspicious
for septic arthritis.
5. Diffuse T2 hyperintensity throughout the lower leg musculature
suggesting a non-specific myositis which may be infectious and or
reflect sequela of denervation.
6. Mild posterior tibialis tenosynovitis.
7. Prior first-fourth transmetatarsal amputations and fifth ray
resection.

These results will be called to the ordering clinician or
representative by the Radiologist Assistant, and communication
documented in the PACS or zVision Dashboard.

## 2020-06-24 ENCOUNTER — Encounter (HOSPITAL_BASED_OUTPATIENT_CLINIC_OR_DEPARTMENT_OTHER): Payer: Medicare HMO | Admitting: Internal Medicine

## 2020-06-24 ENCOUNTER — Other Ambulatory Visit: Payer: Self-pay | Admitting: Endocrinology

## 2020-06-24 ENCOUNTER — Other Ambulatory Visit: Payer: Self-pay

## 2020-06-24 DIAGNOSIS — M86671 Other chronic osteomyelitis, right ankle and foot: Secondary | ICD-10-CM | POA: Diagnosis not present

## 2020-06-27 NOTE — Progress Notes (Addendum)
MCKINSLEY, KOELZER ([id number]) Visit Report for 06/24/2020 Debridement Details Patient Name: Date of Service: Nancy LLO [name] 06/24/2020 12:30 PM Medical Record Number: [id number] Patient Account Number: [id number] Date of Birth/Sex: Treating RN: 02-Aug-1970 (50 y.o. [name] Primary Care Provider: [name], [name] Other Clinician: Referring Provider: Treating Provider/Extender: [name], [name] Weeks in Treatment: 94 Debridement Performed for Assessment: Wound #50 [place] Performed By: Physician Ricard Dillon., MD Debridement Type: Debridement Severity of Tissue Pre Debridement: Fat layer exposed Level of Consciousness (Pre-procedure): Awake and Alert Pre-procedure Verification/Time Out Yes - 13:14 Taken: Start Time: 13:15 Pain Control: Other : benzocaine T Area Debrided (L x W): otal 0.5 (cm) x 0.5 (cm) = 0.25 (cm) Tissue and other material debrided: Non-Viable, Slough, Subcutaneous, Slough Level: Skin/Subcutaneous Tissue Debridement Description: Excisional Instrument: Curette Bleeding: Minimum Hemostasis Achieved: Pressure End Time: 13:19 Response to Treatment: Procedure was tolerated well Level of Consciousness (Post- Awake and Alert procedure): Post Debridement Measurements of Total Wound Length: (cm) 0.5 Width: (cm) 0.5 Depth: (cm) 0.2 Volume: (cm) 0.039 Character of Wound/Ulcer Post Debridement: Stable Severity of Tissue Post Debridement: Fat layer exposed Post Procedure Diagnosis Same as Pre-procedure Electronic Signature(s) Signed: 06/24/2020 6:16:52 PM By: Lorrin Jackson Signed: 06/27/2020 9:50:14 AM By: Linton Ham MD Entered By: Linton Ham on 06/24/2020 13:30:22 -------------------------------------------------------------------------------- Debridement Details Patient Name: Date of Service: Nancy LLO [name] 06/24/2020 12:30 PM Medical Record Number: [id number] Patient Account Number:  [id number] Date of Birth/Sex: Treating RN: [birth year] (50 y.o. [name] Primary Care Provider: [name], [name] Other Clinician: Referring Provider: Treating Provider/Extender: [name], [name] Weeks in Treatment: 94 Debridement Performed for Assessment: Wound #43 Right,Medial Foot Performed By: Physician Ricard Dillon., MD Debridement Type: Debridement Severity of Tissue Pre Debridement: Fat layer exposed Level of Consciousness (Pre-procedure): Awake and Alert Pre-procedure Verification/Time Out Yes - 13:14 Taken: Start Time: 13:19 Pain Control: Other : benzocaine T Area Debrided (L x W): otal 0.5 (cm) x 0.7 (cm) = 0.35 (cm) Tissue and other material debrided: Non-Viable, Slough, Subcutaneous, Slough Level: Skin/Subcutaneous Tissue Debridement Description: Excisional Instrument: Curette Bleeding: Minimum Hemostasis Achieved: Pressure End Time: 13:21 Response to Treatment: Procedure was tolerated well Level of Consciousness (Post- Awake and Alert procedure): Post Debridement Measurements of Total Wound Length: (cm) 0.5 Width: (cm) 0.7 Depth: (cm) 0.1 Volume: (cm) 0.027 Character of Wound/Ulcer Post Debridement: Stable Severity of Tissue Post Debridement: Fat layer exposed Post Procedure Diagnosis Same as Pre-procedure Electronic Signature(s) Signed: 07/08/2020 4:45:31 PM By: Linton Ham MD Signed: 07/08/2020 5:03:53 PM By: Lorrin Jackson Previous Signature: 06/24/2020 6:16:52 PM Version By: Lorrin Jackson Previous Signature: 06/27/2020 9:50:14 AM Version By: Linton Ham MD Entered By: Linton Ham on 07/08/2020 13:15:44 -------------------------------------------------------------------------------- HPI Details Patient Name: Date of Service: Nancy LLO [name] 06/24/2020 12:30 PM Medical Record Number: [id number] Patient Account Number: [id number] Date of Birth/Sex: Treating RN: Aug 15, 1970 (50 y.o. [name] Primary  Care Provider: [name], [name] Other Clinician: Referring Provider: Treating Provider/Extender: [name], [name] Weeks in Treatment: 67 History of Present Illness HPI Description: 50 year old diabetic who is known to have type 1 diabetes which is poorly controlled last hemoglobin A1c was 11%. She comes in with a ulcerated area on the left lateral foot which has been there for over 6 months. Was recently she has been treated by [name] of podiatry who saw her last on 05/28/2016.  Review of his notes revealed that the patient had incision and drainage with placement of antibiotic beads to the left foot on 04/11/2016 for possible osteomyelitis of the cuboid bone. Over the last year she's had a history of amputation of the left fifth toe and a femoropopliteal popliteal bypass graft somewhere in April 2017. 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 His note [name] mentions that the patient has been referred to me for further wound care and possibly great candidate for hyperbaric oxygen therapy due to recurrent osteomyelitis. However we do not have any x-rays of biopsy reports confirming this. He has been on several antibiotics including Bactrim and most recently is on doxycycline for an MRSA. I understand, the patient was not a candidate for IV antibiotics as she has had previous PICC lines which resulted in blood clots in both arms. There was a x-ray report dated 04/04/2016 on [name] notes which showed evidence of fifth ray resection left foot with osteolytic changes noted to the fourth metatarsal and cuboid bone on the left. 06/13/2016 -- had a left foot x-ray which showed no acute fracture or dislocation and no definite radiographic evidence of osteomyelitis. Advanced osteopenia was seen. 06/20/2016 -- she has noticed a new wound on the right plantar foot in the region where she had a callus before. 06/27/16- the patient did have her x-ray of the right foot  which showed no findings to suggest osteomyelitis. She saw her endocrinologist, [name], yesterday. Her A1c in January was 11. He also indicates mismanagement and noncompliance regarding her diabetes. She is currently on Bactrim for a lip infection. She is complaining of nausea, vomiting and diarrhea. She is unable to articulate the exact orders or dosing of the Bactrim; it is unclear when she will complete this. 07/04/2016 -- results from [facility] of ABIs with ankle waveforms were noted from 02/14/2016. The examination done on 06/27/2015 showed noncompressible ABIs with the right being 1.45 and the left being 1.33. The present examination showed a right ABI of 1.19 on the left of 1.33. The conclusion was that right normal ABI in the lower extremity at rest however compared to previous study which was noncompressible ABI may be falsely elevated side suggesting medial calcification. The left ABI suggested medial calcification. 08/01/2016 -- the patient had more redness and pain on her right foot and did not get to come to see as noted she see her PCP or go to the ER and decided to take some leftover metronidazole which she had at home. As usual, the patient does report she feels and is rather noncompliant. 08/08/2016 -- -- x-ray of the right foot -- FINDINGS:Transmetatarsal amputation is noted. No bony destruction is noted to suggest osteomyelitis. IMPRESSION: No evidence of osteomyelitis. Postsurgical changes are seen. MRI would be more sensitive for possible bony changes. Culture has grown Serratia Marcescens -- sensitive to Bactrim, ciprofloxacin, ceftazidime she was seen by [name] on 08/06/2016. He did not find any exposed bone, muscle, tendon, ligament or joint. There was no malodor and he did a excisional debridement in the office. ============ Old notes: 50 year old patient who is known to the wound clinic for a while had been away from the wound clinic since 09/01/2014. Over  the last several months she has been admitted to various hospitals including [place] [facility]. She was treated for a right metatarsal osteomyelitis with a transmetatarsal amputation and this was done about 2 months ago. He has a small ulcerated area on the right  heel and she continues to have an ulcerated area on the left plantar aspect of the foot. The patient was recently admitted to the [facility] hospital group between 7/12 and 10/18/2014. she was given 3 weeks of IV vancomycin and was to follow-up with her surgeons at [facility] and also took oral vancomycin for C. difficile colitis. 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 MRI of the left foot done on 10/14/2014 showed no abscess or osteomyelitis. 04/27/15; this is a patient we know from previous stays in the wound care center. She is a type I diabetic I am not sure of her control currently. Since the last time I saw her she is had a right transmetatarsal amputation and has no wounds on her right foot and has no open wounds. She is been followed at the wound care center at [facility] in [place]. She comes today with the desire to undergo hyperbaric treatment locally. Apparently one of her wound care providers in [place] has suggested hyperbarics. This is in response to an MRI from 04/18/15 that showed increased marrow signal and loss of the proximal fifth metatarsal cortex evidence of osteomyelitis with likely early osteomyelitis in the cuboid bone as well. She has a large wound over the base of the fifth metatarsal. She also has a eschar over her the tips of her toes on 1,3 and 5. She does not have peripheral pulses and apparently is going for an  angiogram tomorrow which seems reasonable. After this she is going to infectious disease at [facility]. They have been using Medihoney to the large wound on the lateral aspect of the left foot to. The patient has known Charcot deformity from diabetic neuropathy. She also has known diabetic PAD. Surprisingly I can't see that she has had any recent antibiotics, the patient states the last antibiotic she had was at the end of November for 10 days. I think this was in response to culture that showed group G strep although I'm not exactly sure where the culture was from. She is also had arterial studies on 03/29/15. This showed a right ABI of 1.4 that was noncompressible. Her left ABI was 0.73. 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 It was not felt that arterial inflow was adequate for healing of a foot ulcer. Her Doppler waveforms looked monophasic ===== READMISSION 02/28/17; this is in an now 50 year old woman we've had at several different occasions in this clinic. 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 She has a remote ex-smoker. She was last seen in this clinic by Dr. Con Memos I think in May. More recently she is been followed by her podiatrist [name] an infectious disease Dr. Megan Salon. She has 2 open wounds the major one is over the right first metatarsal head she also has a wound on the left plantar foot. an MRI of the right foot on 01/01/17 showed a soft tissue ulcer along the plantar aspect of the first metatarsal base consistent with osteomyelitis of the first metatarsal stump. Dr. 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 According to the patient this is been open for slightly over a month. She has been on a combination of Cipro 500 twice a  day, Zyvox 600 twice a day and Flagyl 500 3 times a day for over a month now as directed by Dr. Megan Salon.  cultures of the right foot earlier this year showed MRSA in January and Serratia in May. January also had a few viridans strep. Recent x-rays of both feet were done and [name] office and I don't have these reports. The patient has known PAD and has a history of aleft femoropopliteal bypass in April 2017. 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 Dwyane Dee. In spite of this the last hemoglobin A1c I can see is 10.1 on 01/01/2017. She is being referred by 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 She is been using Medihoney to this area and also an area on the left midfoot. She is using healing sandals bilaterally. ABIs in this clinic at the left posterior tibial was 1.1 noncompressible on the right READMISSION Non invasive vascular NOVANT 5/18 Aftercare following surgery of the circulatory system Procedure Note - Interface, External Ris In - 08/13/2016 11:05 AM EDT Procedure: Examination consists of physiologic resting arterial pressures of the brachial and ankle arteries bilaterally with continuous wave Doppler waveform analysis. Previous: Previous exam performed on 02/14/16 demonstrated ABIs of Rt = 1.19 and Lt = 1.33. Right: ABI = non-compressible PT 1.47 DP. S/P transmet amputation. , Left: ABI = 1.52, 2nd digit pressure = 87 mmHg Conclusions: Right: ABI (>1.3) may be falsely elevated, suggesting medial calcification. Left: ABI (>1.3) may be falsely elevated, suggesting medial calcification The patient is a now 50 year old type I diabetic is had multiple issues her graded to chronic diabetic foot ulcers. 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 We had her in the  clinic lastin November. At that point she had wounds on her bilateral feet.she had wanted to try hyperbarics however the healogics review process denied her because she hadn't followed up with her vascular surgeon for her left femoropopliteal bypass. The bypass was done by Dr. Raul Del at [facility]. We made her a follow-up with Dr. Raul Del however she did not keep the appointment and therefore she was not approved The patient shows me a small wound on her left fourth metatarsal head on her phone. She developed rapid discoloration in the plantar aspect of the left foot and she was admitted to hospital from 2/2 through 05/10/17 with wet gangrene of the left foot osteomyelitis of the fourth metatarsal heads. She was admitted acutely ill with a temperature of 103. She was started on broad-spectrum vancomycin and cefepime. On 05/06/17 she was taken to the OR by [name] her podiatric surgeon for an incision and drainage irrigation of the left foot wound. Cultures from this surgery revealed group be strep and anaerobes. she was seen by Dr.Xu of orthopedic surgery and scheduled for a below-knee amputation which she u refused. Ultimately she was discharged on Levaquin and Flagyl for one month. MRI 05/05/17 done while she was in the hospital showed abscess adjacent to the fourth metatarsal head and neck small abscess around the fourth flexor tendon. Inflammatory phlegmon and gas in the soft tissues along the lateral aspect of the fourth phalanx. Findings worrisome for osteomyelitis involving the fourth proximal and middle phalanx and also the third  and fourth metatarsals. Finally the patient had actually shortly before this followed up with Dr. Raul Del at no time on 04/29/17. He felt that her left femoropopliteal bypass was patent he felt that her left-sided toe pressures more than adequate for healing a wound on the left foot. This was before her acute presentation. Her noninvasive diabetes are listed  above. 05/28/17; she is started hyperbarics. The patient tells me that for some reason she was not actually on Levaquin but I think on ciprofloxacin. She was on Flagyl. She only started her Levaquin yesterday due to some difficulty with the pharmacy and perhaps her sister picking it up. She has an appointment with [name] tomorrow and with infectious disease early next week. She has no new complaints 06/06/17; the patient continues in hyperbarics. She saw [name] on 05/29/17 who is her podiatric surgeon. He is elected for a transmetatarsal amputation on 06/27/17. I'm not sure at what level he plans to do this amputation. The patient is unaware She also saw Dr. Megan Salon of infectious disease who elected to continue her on current antibiotics I think this is ciprofloxacin and Flagyl. I'll need to clarify with her tomorrow if she actually has this. We're using silver alginate to the actual wound. Necrotic surface today with material under the flap of her foot. Original MRI showed abscesses as well as osteomyelitis of the proximal and middle fourth phalanx and the third and fourth metatarsal heads 06/11/17; patient continues in hyperbarics and continues on oral antibiotics. She is doing well. The wound looks better. The necrotic part of this under the flap in her superior foot also looks better. she is been to see [name]. I haven't had a chance to look at his note. Apparently he has put the transmetatarsal amputation on hold her request it is still planning to take her to the OR for debridement and product application ACEL. I'll see if I can find his note. I'll therefore leave product ordering/requests to [name] for now. I was going to look at Dermagraft 06/18/17-she is here in follow-up evaluation for bilateral foot wounds. She continues with hyperbaric therapy. She states she has been applying manuka honey to the right plantar foot and alternate manuka honey and silver alginate to the left foot,  despite our orders. We will continue with same treatment plan and she will follo up next week. 06/25/17; I have reviewed [name] last note from 3/11. She has operative debridement in 2 days' time. By review his note apparently they're going to place there is skin over the majority of this wound which is a good choice. She has a small satellite area at the most proximal part of this wound on the left plantar foot. The area on the right plantar foot we've been using silver alginate and it is close to healing. 07/02/17; unfortunately the patient was not easily approved for [name] proposed surgery. I'm not completely certain what the issue is. She has been using silver alginate to the wound she has completed a first course of hyperbarics. She is still on Levaquin and Flagyl. I have really lost track of the time course here.I suspect she should have another week to 2 of antibiotics. I'll need to see if she is followed up with infectious disease Dr. Megan Salon 07/09/17; the patient is followed up with Dr. Megan Salon. 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 She continues on Levaquin and metronidazole continuing both of these for now  I think she is been on fr about 6 weeks. She still has some drainage but no pain. No fever. Her had been plans for her to go to the OR for operative debridement with her podiatrist [name], I am not exactly sure where that is. I'll probably slip a note to [name] today. I note that she follows with Dr. Dwyane Dee of endocrinology. We have her recertified for hyperbaric oxygen. I have not heard about Dermagraft however I'll see if [name] is planning a skin substitute as well 07/16/17; the patient tells me she is just about out of [place]. I'll need to check Dr. Hale Bogus last notes on this. She states she has plenty of Flagyl however. She comes in today complaining of pain in the right lateral foot which she said lasted for about a day.  The wound on the right foot is actually much more medially. She also tells me that the [facility] cost a lot of pain in the left foot wound and she turned back to silver alginate. Finally Dermagraft has a $258 per application co-pay. She cannot afford this 07/23/17; patient arrives today with the wound not much smaller. There is not much new to add. She has not heard from [name] all try to put in a call to them today. She was asking about Dermagraft again and she has an over $527 per application co-pay she states that she would be willing to try to do a payment plan. I been tried to avoid this. We've been using silver alginate, I'll change to [facility] 07/30/17-She is here in follow-up evaluation for left foot ulcer. She continues hyperbaric medicine. The left foot ulcer is stable we will continue with same treatment plan 08/06/17; she is here for evaluation of her left foot ulcer. Currently being treated for hyperbarics or underlying osteomyelitis. She is completed antibiotics. The left foot ulcer is better smaller with healthier looking granulation. For various reasons I am not really clear on we never got her back to the OR with [name]. He did not respond to my secure text message. Nevertheless I think that surgery on this point is not necessary nor am I completely clear that a skin substitute is necessary The patient is complaining about pain on the outside of her right foot. 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 There is no erythema. She also states the foot is warm versus her other part of her upper leg and this is largely true. It is not totally clear to me what's causing this. She thinks it's different from her usual neuropathy pain 08/13/17; she arrives in clinic today with a small wound which is superficial on her right first metatarsal head. 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 She tells [place] she was up on her feet over the Mother's Day  celebration. The large wound is on the left foot. Continues with hyperbarics for underlying osteomyelitis. We're using Hydrofera Blue. She asked me today about where we were with Dermagraft. I had actually excluded this because of the co-pay however she wants to assume this therefore I'll recheck the co-pay an order for next week. 08/20/17; the patient agreed to accept the co-pay of the first Apligraf which we applied today. She is disappointed she is finishing hyperbarics will run this through the insurance on the extent of the foot infection and the extent of the wound that she had however she is already had 60 dive's. Dermagraft No. 1 08/27/17; Dermagraft No. 2.  She is not eligible for any more hyperbaric treatments this month. She reports a fair amount of drainage and she actually changed to the external dressings without disturbing the direct contact layer 09/03/17; the patient arrived in clinic today with the wound superficially looking quite healthy. Nice vibrant red tissue with some advancing epithelialization although not as much adherence of the flap as I might like. However she noted on her own fourth toe some bogginess and she brought that to our attention. Indeed this was boggy feeling like a possibility of subcutaneous fluid. 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 She is not been unwell. We've been using Dermagraft 09/10/17; the culture that I did not last week was MRSA. She saw Dr. Megan Salon this morning who is going to start her on vancomycin. I had sent him a secure a text message yesterday. I also spoke with her podiatric surgeon [name] about surgery on this foot the options for conserving a functional foot etc. Promised me he would see her and will make back consultation today. Paradoxically her actual wound on the plantar aspect of her left foot looks really quite good. I had given her 5 days worth of  Baxdella to cover her for MRSA. Her MRI came back showing osteomyelitis within the third metatarsal shaft and head and base of the third and fourth proximal phalanx. She had extensive inflammatory changes throughout the soft tissue of the lateral forefoot. With an ill-defined fluid around the fourth metatarsal extending into the plantar and dorsal soft tissues 09/19/17; the patient is actually on oral Septra and Flagyl. She apparently refused IV vancomycin. She also saw [name] at my request who is planning her for a left BKA sometime in mid July. MRI showed osteomyelitis within the third metatarsal shaft and head and the basis of the third and fourth proximal phalanx. I believe there was felt to be possible septic arthritis involving the third MTP. 09/26/17; the patient went back to Dr. Megan Salon at my suggestion and is now receiving IV daptomycin. Her wound continues to look quite good making the decision to proceed with a transmetatarsal amputation although more difficult for the patient. I believe in my extensive discussions with her she has a good sense of the pros and cons of this. I don't NV the tuft decision she has to make. She has an appointment with [name] I believe in mid July and I previously spoken to him about this issue Has we had used 3 previous Dermagraft. Given the condition of the wound surface I went ahead and added the fourth one today area and I did this not fully realizing that she'll be traveling to [place] next week. I'm hopeful she can come back in 2 weeks 10/21/17; Her same Dermagraft on for about 3-1/2 weeks. In spite of this the wound arrives looking quite healthy. There is been a lot of healing dimensions are smaller. Looking at the square shaped wound she has now there is some undermining and some depth medially under the undermining although I cannot palpate any bone. No surrounding infection is obvious. She has difficult questions about how to look at this going  forward vis--vis amputations versus continued medical therapy. T be truthful the wound is looks o so healthy and it is continued to contract. Hard to justify foot surgery at this point although I still told her that I think it might come to that  if we are not able to eradicate the underlying MRSA. She is still highly at risk and she understands this 11/06/17 on evaluation today patient appears to be doing better in regard to her foot ulcer. She's been tolerating the dressing changes without complication. Currently she is here for her Dermagraft #6. Her wound continues to make excellent progress at this point. She does not appear to have any evidence of infection which is good news. 11/13/17 on evaluation today patient appears to be doing excellent at this time. She is here for repeat Dermagraft application. This is #7. Overall her wound seems to be making great progress. 12/05/17; the patient arrives with the wound in much better condition than when I last saw this almost 6 weeks ago. She still has a small probing area in the left metatarsal head region on the lateral aspect of her foot. We applied her last Dermagraft today. Since the last time she is here she has what appears to a been a blood blister on the plantar aspect of left foot although I don't see this is threatening. There is also a thick raised tissue on the right mid metatarsal head region. This was not there I don't think the last time she was here 3 weeks ago. 12/12/17; the patient continues to have a small programming area in the left metatarsal head region on the lateral aspect of her foot which was the initial large surgical wound. I applied her last Apligraf last week. I'm going to use Endoform starting today Unfortunately she has an excoriated area in the left mid foot and the right mid foot. The left midfoot looks like a blistered area this was not opened last week it certainly is open today. Using silver alginate on these areas. She  promises me she is offloading this. 12/19/17; the small probing area in the left metatarsal head eyes think is shallower. In general her original wound looks better. We've been using Endoform. The area inferiorly that I think was trauma last week still requires debridement a lot of nonviable surface which I removed. She still has an open open area distally in her foot Similarly on the right foot there is tightly adherent surface debris which I removed. Still areas that don't look completely epithelialized. This is a small open area. We used silver alginate on these areas 12/26/2017; the patient did not have the supplies we ordered from last week including the Endoform. The original large wound on the left lateral foot looks healthy. She still has the undermining area that is largely unchanged from last week. She has the same heavily callused raised edged wounds on the right mid and left midfoot. Both of these requiring debridement. We have been using silver alginate on these areas 01/02/2018; there is still supply issues. We are going to try to use Prisma but I am not sure she actually got it from what she is saying. She has a new open area on the lateral aspect of the left fourth toe [previous fifth ray amputation]. Still the one tunneling area over the fourth metatarsal head. The area is in the midfoot bilaterally still have thick callus around them. She is concerned about a raised swelling on the lateral aspect of the foot. However she is completely insensate 01/10/2018; we are using Prisma to the wounds on her bilateral feet. Surprisingly the tunneling area over the left fourth metatarsal head that was part of her original surgery has closed down. She has a small open area remaining on the  incision line. 2 open areas in the midfoot. 02/10/2018; the patient arrives back in clinic after a month hiatus. She was traveling to visit family in [place]. Is fairly clear she was not offloading the areas on  her feet. The original wound over the left lateral foot at the level of metatarsal heads is reopened and probes medially by about a centimeter or 2. She notes that a week ago she had purulent drainage come out of an area on the left midfoot. Paradoxically the worst area is actually on the right foot is extensive with purulent drainage. We will use silver alginate today 02/17/2018; the patient has 3 wounds one over the left lateral foot. She still has a small area over the metatarsal heads which is the remnant of her original surgical wound. This has medial probing depth of roughly 1.4 cm somewhat better than last week. The area on the right foot is larger. We have been using silver alginate to all areas. The area on the right foot and left foot that we cultured last week showed both Klebsiella and Proteus. Both of these are quinolone sensitive. The patient put her's self on Bactrim and Flagyl that she had left hanging around from prior antibiotic usages. She was apparently on this last week when she arrived. I did not realize this. Unfortunately the Bactrim will not cover either 1 of these organisms. We will send in Cipro 500 twice daily for a week 03/04/2018; the patient has 2 wounds on the left foot one is the original wound which was a surgical wound for a deep DFU. At one point this had exposed bone. She still has an area over the fourth metatarsal head that probes about 1.4 cm although I think this is better than last week. I been using silver nitrate to try and promote tissue adherence and been using silver alginate here. She also has an area in the left midfoot. This has some depth but a small linear wound. Still requiring debridement. On the right midfoot is a circular wound. A lot of thick callus around this area. We have been using silver alginate to all wound areas She is completed the ciprofloxacin I gave her 2 weeks ago. 03/11/2018; the patient continues to have 2 open areas on the left  foot 1 of which was the original surgical wound for a deep DFU. Only a small probing area remains although this is not much different from last week we have been using silver alginate. The other area is on the midfoot this is smaller linear but still with some depth. We have been using silver alginate here as well On the right foot she has a small circular wound in the mid aspect. This is not much smaller than last time. We have been using silver alginate here as well 03/18/2018; she has 3 wounds on the left foot the original surgical wound, a very superficial wound in the mid aspect and then finally the area in the mid plantar foot. She arrives in today with a very concerning area in the wound in the mid plantar foot which is her most proximal wound. There is undermining here of roughly 1-1/2 cm superiorly. Serosanguineous drainage. She tells me she had some pain on for over the weekend that shot up her foot into her thigh and she tells me that she had a nodule in the groin area. She has the single wound in the right foot. We are using endoform to both wound areas 03/24/2018; the patient arrives  with the original surgical wound in the area on the left midfoot about the same as last week. There is a collection of fluid under the surface of the skin extending from the surgical wound towards the midfoot although it does not reach the midfoot wound. The area on the right foot is about the same. Cultures from last week of the left midfoot wound showed abundant Klebsiella abundant Enterococcus faecalis and moderate methicillin resistant staph I gave her Levaquin but this would have only covered the Klebsiella. She will need linezolid 04/01/2018; she is taking linezolid but for the first few days only took 1 a day. I have advised her to finish this at twice daily dosing. In any case all of her wounds are a lot better especially on the left foot. The original surgical wound is closed. The area on the left  midfoot considerably smaller. The area on the right foot also smaller. 04/08/2018; her original surgical wound/osteomyelitis on the left foot remains closed. She has area on the left foot that is in the midfoot area but she had some streaking towards this. This is not connected with her original wound at least not visually. Small wound on the right midfoot appears somewhat smaller. 04/15/18; both wounds looks better. Original wound is better left midfoot. Using silver alginate 1/21; patient states she uses saltwater soak in, stones or remove callus from around her wounds. She is also concerned about a blood blister she had on the left foot but it simply resolved on its own. We've been using silver alginate 1/28; the patient arrives today with the same streaking area from her metatarsals laterally [the site of her original surgical wound] down to the middle of her foot. There is some drainage in the subcutaneous area here. This concerns me that there is actually continued ongoing infection in the metatarsals probably the fourth and third. This fixates an MRI of the foot without contrast [chronic renal failure] The wound in the mid part of the foot is small but I wonder whether this area actually connects with the more distal foot. The area on the right midfoot is probably about the same. Callus thick skin around the small wound which I removed with a curette we have been using silver alginate on both wound areas 2/4; culture I did of the draining site on the left foot last time grew methicillin sensitive staph aureus. MRI of the left foot showed interval resolution of the findings surrounding the third metatarsal joint on the prior study consistent with treated osteomyelitis. Chronic soft tissue ulceration in the plantar and lateral aspect of the forefoot without residual focal fluid collection. No evidence of recurrent osteomyelitis. Noted to have the previous amputation of the distal first phalanx and  fifth ray MRI of the right foot showed no evidence of osteomyelitis I am going to treat the patient with a prolonged course of antibiotics directed against MSSA in the left foot 2/11; patient continues on cephalexin. She tells me she had nausea and vomiting over the weekend and missed 2 days. In general her foot looks much the same. She has a small open area just below the left fourth metatarsal head. A linear area in the left midfoot. Some discoloration extending from the inferior part of this into the left lateral foot although this appears to be superficial. She has a small area on the right midfoot which generally looks smaller after debridement 2/18; the patient is completing his cephalexin and has another 2 days. She continues to  have open areas on the left and right foot. 2/25; she is now off antibiotics. The area on the left foot at the site of her original surgical wound has closed yet again. She still has open areas in the mid part of her foot however these appear smaller. The area on the right mid foot looks about the same. We have been using silver alginate She tells me she had a serious hypoglycemic spell at home. She had to have EMS called and get IV dextrose 3/3; disappointing on the left lateral foot large area of necrotic tissue surrounding the linear area. This appears to track up towards the same original surgical wound. Required extensive debridement. The area on the right plantar foot is not a lot better also using silver 3/12; the culture I did last time showed abundant enterococcus. I have prescribed Augmentin, should cover any unrecognized anaerobes as well. In addition there were a few MRSA and Serratia that would not be well covered although I did not want to give her multiple antibiotics. She comes in today with a new wound in the right midfoot this is not connected with the original wound over her MTP a lot of thick callus tissue around both wounds but once again she  said she is not walking on these areas 3/17-Patient comes in for follow-up on the bilateral plantar wounds, the right midfoot and the left plantar wound. Both these are heavily callused surrounding the wounds. We are continuing to use silver alginate, she is compliant with offloading and states she uses a wheelchair fairly often at home 3/24; both wound areas have thick callus. However things actually look quite a bit better here for the majority of her left foot and the right foot. 3/31; patient continues to have thick callused somewhat irritated looking tissue around the wounds which individually are fairly superficial. There is no evidence of surrounding infection. We have been using silver alginate however I change that to [facility] today 4/17; patient returns to clinic after having a scare with Covid she tested negative in her primary doctor's office. She has been using Hydrofera Blue. She does not have an open area on the right foot. On the left foot she has a small open area with the mid area not completely viable. She showed me pictures of what looks like a hemorrhagic blister from several days ago but that seems to have healed over this was on the lateral left foot 4/21; patient comes in to clinic with both her wounds on her feet closed. However over the weekend she started having pain in her right foot and leg up into the thigh. She felt as though she was running a low-grade fever but did not take her temperature. She took a doxycycline that she had leftover and yesterday a single Septra and metronidazole. She thinks things feel somewhat better. 4/28; duplex ultrasound I ordered last week was negative for DVT or superficial thrombophlebitis. She is completed the doxycycline I gave her. States she is still having a lot of pain in the right calf and right ankle which is no better than last week. She cannot sleep. She also states she has a temperature of up to 101, coughing and complaining  of visual loss in her bilateral eyes. Apparently she was tested for Covid 2 weeks ago at [facility] and that was negative. Readmission: 09/03/18 patient presents back for reevaluation after having been evaluated at the end of April regarding erythema and swelling of her right lower extremity. Subsequently  she ended up going to the hospital on 07/29/18 and was admitted not to be discharged until 08/08/18. Unfortunately it was noted during the time that she was in the hospital that she did have methicillin-resistant Staphylococcus aureus as the infection noted at the site. It was also determined that she did have osteomyelitis which appears to be fairly significant. She was treated with vancomycin and in fact is still on IV vancomycin at dialysis currently. This is actually slated to continue until 09/12/18 at least which will be the completion of the six weeks of therapy. Nonetheless based on what I'm seeing at this point I'm not sure she will be anywhere near ready to discontinue antibiotics at that time. Since she was released from the hospital she was seen by [name] who is her podiatrist on 08/27/18. His note specifically states that he is recommended that the patient needs of one knee amputation on the right as she has a life- threatening situation that can lead quickly to sepsis. The patient advised she would like to try to save her leg to which [name] apparently told her that this was against all medical advice. She also want to discontinue the Wound VAC which had been initiated due to the fact that she wasn't pleased with how the wound was looking and subsequently she wanted to pursue applying Medihoney at that time. He stated that he did not believe that the right lower extremity was salvageable and that the patient understood but would still like to attempt hyperbaric option therapy if it could be of any benefit. She was therefore referred back to [place] for further evaluation. He plans to see her back  next week. Upon inspection today patient has a significant amount purulent drainage noted from the wound at this point. The bone in the distal portion of her foot also appears to be extremely necrotic and spongy. When I push down on the bone it bubbles and seeps purulent drainage from deeper in the end of the foot. I do not think that this is likely going to heal very well at all and less aggressive surgical debridement were undertaken more than what I believe we can likely do here in our office. 09/12/2018; I have not seen this patient since the most recent hospitalization although she was in our clinic last week. I have reviewed some of her records from a complex hospitalization. 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 There is situation was complicated by MRSA bacteremia and acute on chronic renal failure now on dialysis. She is receiving vancomycin at dialysis. We started her on Dakin's wet-to-dry last week she is changing this daily. There is still purulent drainage coming out of her foot. Although she is apparently "agreeable" to a below-knee amputation which is been suggested by multiple clinicians she wants this to be done in [place]. She apparently has a telehealth visit with that provider sometime in late [place] 6/24. I have told her I think this is probably too long. Nevertheless I could not convince her to allow a local doctor to perform BKA. 09/19/2018; the patient has a large necrotic area on the right anterior foot. She has had previous transmetatarsal amputations. Culture I did last week showed MRSA nothing else she is on vancomycin at dialysis. She has continued leaking purulent drainage out of the distal part of the large circular wound on the right anterior foot. She apparently went to see Dr. 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 Somehow that translated into her being referred to plastic surgery  for debridement of the area. I gather she basically refused amputation although I do not have a copy of Dr. Berenice Primas notes. The patient really wants to have a trial of hyperbaric oxygen. I agreed with initial assessment in this clinic that this was probably too far along to benefit however if she is going to have plastic surgery I think she would benefit from ancillary hyperbaric oxygen. The issue here is that the patient has benefited as maximally as any patient I have ever seen from hyperbaric oxygen therapy. Most recently she had exposed bone on the lateral part of her left foot after a surgical procedure and that actually has closed. She has eschared areas in both heels but no open area. She is remained systemically well. I am not optimistic that anything can be done about this but the patient is very clear that she wants an attempt. The attempt would include a wound VAC further debridements and hyperbaric oxygen along with IV antibiotics. 6/26; I put her in for a trial of hyperbaric oxygen only because of the dramatic response she has had with wounds on her left midfoot earlier this year which was a surgical wound that went straight to her bone over the metatarsal heads and also remotely the left third toe. We will see if we can get this through our review process and insurance. She arrives in clinic with again purulent material pouring out of necrotic bone on the top of the foot distally. There is also some concerning erythema on the front of the leg that we marked. It is bit difficult to tell how tender this is because of neuropathy. I note from infectious disease that she had her vancomycin extended. 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 She had the wound VAC on for part of the week. The rest of the time she is putting various things on this including Medihoney, "ionized water" silver sorb gel etc. 7/7; follow-up along with HBO. She is still on vancomycin at  dialysis. She has a large open area on the dorsal right foot and a small dark eschar area on her heel. There is a lot less erythema in the area and a lot less tenderness. From an infection point of view I think this is better. She still has a lot of necrosis in the remaining right forefoot [previous TMA] we are still using the wound VAC in this area 7/16; follow-up along with HBO. I put her on linezolid after she finished her vancomycin. We started this last Friday I gave her 2 weeks worth. I had the expectation that she would be operatively debrided by Dr. Marla Roe but that still has not happened yet. Patient phoned the office this week. She arrives for review today after HBO. The distal part of this wound is completely necrotic. Nonviable pieces of tendon bone was still purulent drainage. Also concerning that she has black eschar over the heel that is expanding. I think this may be indicative of infection in this area as well. She has less erythema and warmth in the ankle and calf but still an abnormal exam 7/21 follow-up along with HBO. I will renew her linezolid after checking a CBC with differential monitoring her blood counts especially her platelets. She was supposed to have surgery yesterday but if I am reading things correctly this was canceled after her blood sugar was found to be over  500. I thought Dr. Marla Roe who called me said that they were sending her to the ER but the patient states that was not the case. 7/28. Follow-up along with HBO. She is on linezolid I still do not have any lab work from dialysis even though I called last week. The patient is concerned about an area on her left lateral foot about the level of the base of her fifth metatarsal. I did not really see anything that ominous here however this patient is in [place] ability to point out problems that she is sensing and she has been accurate in the past Finally she received a call from Dr. Marla Roe who is referring  her to another orthopedic surgeon stating that she is too booked up to take her to the operating room now. Was still using a wound VAC on the foot 8/3 -Follow-up after HBO, she is got another week of linezolid, she is to call ID for an appointment, x-rays of both feet were reviewed, the left foot x-ray with third MTP joint osteo- Right foot x-ray widespread osteo-in the right midfoot Right ankle x-ray does not show any active evidence of infection 8/11-Patient is seen after HBO, the wounds on the right foot appear to be about the same, the heel wound had some necrotic base over tendon that was debrided with a curette 8/21; patient is seen after HBO. The patient's wound on her dorsal foot actually looks reasonably good and there is substantial amount of epithelialization however the open area distally still has a lot of necrotic debris partially bone. I cannot really get a good sense of just how deep this probes under the foot. She has been pressuring me this week to order medical maggots through a company in [place] for her. The problem I have is there is not a defined wound area here. On the positive side there is no purulence. She has been to see infectious disease she is still on [facility] although I have not had a chance to review their notes 8/28; patient is seen in conjunction with HBO. The wounds on her foot continued to improve including the right dorsal foot substantially the, the distal part of this wound and the area on the right heel. We have been using a wound VAC over this chronically. She is still on trimethoprim as directed by infectious disease 9/4; patient is seen in conjunction with HBO. Right dorsal foot wound substantially anteriorly is better however she continues to have a deep wound in the distal part of this that is not responding. We have been using silver collagen under border foam Area on the right plantar medial heel seems better. We have been using Hydrofera  Blue 12/12/18 on evaluation today patient appears to be doing about the same with regard to her wound based on prior measurements. She does have some necrotic tissue noted on the lateral aspect of the wound that is going require a little bit of sharp debridement today. This includes what appears to be potentially either severely necrotic bone or tendon. Nonetheless other than that she does not appear to have any severe infection which is good news 9/18; it is been 2 weeks since I saw this wound. She is tolerating HBO well. Continued dramatic improvement in the area on the right dorsal foot. She still has a small wound on the heel that we have been using Hydrofera Blue. She continues with a wound VAC 9/24; patient has to be seen emergently today with a swelling on  her right lateral lower leg. She says that she told Dr. Evette Doffing about this and also myself on a couple of occasions but I really have no recollection of this. She is not systemically unwell and her wound really looked good the last time I saw this. She showed this to providers at dialysis and she was able to verify that she was started on cephalexin today for 5 doses at dialysis. She dialyzes on Tuesday Thursday and Saturday. 10/2; patient is seen in conjunction with HBO. The area that is draining on the right anterior medial tibia is more extensive. Copious amounts of serosanguineous drainage with some purulence. We are still using the wound VAC on the original wound then it is stable. Culture I did of the original IandD showed MRSA I contacted dialysis she is now on vancomycin with dialysis treatments. I asked them to run a month 10/9; patient seen in conjunction with HBO. She had a new spontaneous open area just above the wound on the right medial tibia ankle. More swelling on the right medial tibia. Her wound on the foot looks about the same perhaps slightly better. There is no warmth spreading up her leg but no obvious erythema. her MRI  of the foot and ankle and distal tib-fib is not booked for next Friday I discussed this with her in great detail over multiple days. it is likely she has spreading infection upper leg at least involving the distal 25% above the ankle. She knows that if I refer her to orthopedics for infectious disease they are going to recommend amputation and indeed I am not against this myself. We had a good trial at trying to heal the foot which is what she wanted along with antibiotics debridement and HBO however she clearly has spreading infection [probably staph aureus/MRSA]. Nevertheless she once again tells me she wants to wait the left of the MRI. She still makes comments about having her amputation done in [place]. 10/19; arrives today with significant swelling on the lateral right leg. Last culture I did showed Klebsiella. Multidrug-resistant. Cipro was intermediate sensitivity and that is what I have her on pending her MRI which apparently is going to be done on Thursday this week although this seems to be moving back and forth. She is not systemically unwell. We are using silver alginate on her major wound area on the right medial foot and the draining areas on the right lateral lower leg 10/26; MRI showed extensive abscess in the anterior compartment of the right leg also widespread osteomyelitis involving osseous structures of the midfoot and portions of the hindfoot. Also suspicion for osteomyelitis anterior aspect of the distal medial malleolus. Culture I did of the purulence once again showed a multidrug-resistant Klebsiella. I have been in contact with nephrology late last week and she has been started on cefepime at dialysis to replace the vancomycin We sent a copy of her MRI report to Dr. Geroge Baseman in [place] who is an orthopedic surgeon. The patient takes great stock in his opinion on this. She says she will go to [place] to have her leg amputated if Dr. Geroge Baseman does not feel there is any  salvage options. 11/2; she still is not talk to her orthopedic surgeon in [place]. Apparently he will call her at [phone] this afternoon. The quality of this is she has not allowed me to refer her anywhere. She has been told over and over that she needs this amputated but has not agreed to be referred. She tells me  her blood sugar was 600 last night but she has not been febrile. 11/9; she never did got a call from the orthopedic surgeon in [place] therefore that is off the radar. We have arranged to get her see orthopedic surgery at [facility]. She still has a lot of draining purulence coming out of the new abscess in her right leg although that probably came from the osteomyelitis in her right foot and heel. Meanwhile the original wound on the right foot looks very healthy. Continued improvement. The issue is that the last MRI showed osteomyelitis in her right foot extensively she now has an abscess in the right anterior lower leg. There is nobody in [place] who will offer this woman anything but an amputation and to be honest that is probably what she needs. I think she still wants to talk about limb salvage although at this point I just do not see that. She has completed her vancomycin at dialysis which was for the original staph aureus she is still on cefepime for the more recent Klebsiella. She has had a long course of both of these antibiotics which should have benefited the osteomyelitis on the right foot as well as the abscess. 11/16; apparently [place] elective surgery is shut down because of COVID-19 pandemic. I have reached out to some contacts at [facility] to see if we can get her an orthopedic appointment there. I am concerned about continually leaving this but for the moment everything is static. In fact her original large wound on this foot is closing down. It is the abscess on the right anterior leg that continues to drain purulent serosanguineous material. She is not currently  on any antibiotics however she had a prolonged course of vancomycin [1 month] as well as cefepime for a month 02/24/2019 on evaluation today patient appears to be doing better than the last time I saw her. This is not a patient that I typically see. With that being said I am covering for Dr. Dellia Nims this week and again compared to when I last saw her overall the wounds in particular seem to be doing significantly better which is good news. With that being said the patient tells me several disconcerting things. She has not been able to get in to see anyone for potential debridement in regard to her leg wounds although she tells me that she does not think it is necessary any longer because she is taking care of that herself. She noticed a string coming out of the lower wound on her leg over the last week. The patient states that she subsequently decided that we must of pack something in there and started pulling the string out and as it kept coming and coming she realized this was likely her tendon. With that being said she continued to remove as much of this as she could. She then I subsequently proceeded to using tubes of antibiotic ointment which she will stick down into the wound and then scored as much as she can until she sees it coming out of the other wound opening. She states that in doing this she is actually made things better and there is less redness and irritation. With regard to her foot wound she does have some necrotic tendon and tissue noted in one small corner but again the actual wound itself seems to be doing better with good granulation in general compared to my last evaluation. 12/7; continued improvement in the wound on the substantial part of the right medial foot. Still  a necrotic area inferiorly that required debridement but the rest of this looks very healthy and is contracting. She has 2 wounds on the right lateral leg which were her original drainage sites from her abscess but  all of this looks a lot better as well. She has been using silver alginate after putting antibiotic biotic ointment in one wound and watching it come out the other. I have talked to her in some detail today. I had given her names of orthopedic surgeons at [facility] for second opinion on what to do about the right leg. I do not think the patient never called them. She has not been able to get a hold of the orthopedic surgeon in [place] that she had put a lot of faith in as being somebody would give her an opinion that she would trust. I talked to her today and said even if I could get her in to another orthopedic surgeon about the leg which she accept an amputation and she said she would not therefore I am not going to press this issue for the moment 12/14; continued improvement in his substantial wound on the right medial foot. There is still a necrotic area inferiorly with tightly adherent necrotic debris which I have been working on debriding each time she is here. She does not have an orthopedic appointment. Since last time she was here I looked over her cultures which were essentially MRSA on the foot wound and gram-negative rods in the abscess on the anterior leg. 12/21; continued improvement in the area on the right medial foot. She is not up on this much and that is probably a good thing since I do not know it could support continuous ambulation. She has a small area on the right lateral leg which were remanence of the IandD's I did because of the abscess. I think she should probably have prophylactic antibiotics I am going to have to look this over to see if we can make an intelligent decision here. In the meantime her major wound is come down nicely. Necrotic area inferiorly is still there but looks a lot better 04/06/2019; she has had some improvement in the overall surface area on the right medial foot somewhat narrowedr both but somewhat longer. The areas on the right lateral leg which  were initial IandD sites are superficial. Nothing is present on the right heel. We are using silver alginate to the wound areas 1/18; right medial foot somewhat smaller. Still a deep probing area in the most distal recess of the wound. She has nothing open on the right leg. She has a new wound on the plantar aspect of her left fourth toe which may have come from just pulling skin. The patient using Medihoney on the wound on her foot under silver alginate. I cannot discourage her from this 2/1; 2-week follow-up using silver alginate on the right foot and her left fourth toe. The area on the right dorsal foot is contracted although there is still the deep area in the most distal part of the wound but still has some probing depth. No overt infection 2/15; 2-week follow-up. She continues to have improvement in the surface area on the dorsal right foot. Even the tunneling area from last time is almost closed. The area that was on the plantar part of her left fourth toe over the PIP is indeed closed 3/1; 2-week follow-up. Continued improvement in surface area. The original divot that we have been debriding inferiorly I think has full  epithelialization although the epithelialization is gone down into the wound with probably 4 mm of depth. Even under intense illumination I am unable to see anything open here. The remanence of the wound in this area actually look quite healthy. We have been using silver alginate 3/15; 2-week follow-up. Unfortunately not as good today. She has a comma shaped wound on the dorsal foot however the upper part of this is larger. Under illumination debris on the surface She also tells [place] that she was on her right leg 2 times in the last couple of weeks mostly to reach up for things above her head etc. She felt a sharp pain in the right leg which she thinks is somewhere from the ankle to the knee. The patient has neuropathy and is really uncertain. She cannot feel her foot so she  does not think it was coming from there 3/29; 2-week follow-up. Her wound measures smaller. Surface of the wound appears reasonable. She is using silver alginate with underlying Medihoney. She has home health. X-rays I did of her tib-fib last time were negative although it did show arterial calcification 4/12; 2-week follow-up. Her wound measures smaller in length. Using manuka honey with silver alginate on top. She has home health. 4/26; 2-week follow-up. Her wound is smaller but still very adherent debris under illumination requiring debridement she has been using manuka honey with silver alginate. 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 Been using Hydrofera Blue. She is on suppressive Bactrim but apparently she has been taking it twice daily 6/7; I have not seen this wound and about 6 weeks. Since then she was up in [place]. By her own admission she was walking on the foot because she did not have a wheelchair. The wound is not nearly as healthy looking as it was the last time I saw this. We ordered different things for her but she only uses Medihoney and silver alginate. As far as I know she is on suppressive trimethoprim sulfamethoxazole. She does not admit to any fever or chills. Her CBGs apparently are at baseline however she is saying that she feels some discomfort on the lateral part of her ankle I looked over her last inflammatory markers from the summer 2020 at which time she had a deeply necrotic infected wound in this area. On 11/10/2018 her sedimentation rate was 56 and C-reactive protein 9.9. This was 107 and 29 on 07/29/2018. 6/17; the patient had a necrotic wound the last time she was here on the right dorsal foot. After debridement I did a culture. This showed a very resistant ESBL Klebsiella as well as Enterococcus. Her x-ray of the foot which was done because of warmth and  some discomfort showed bone destruction within the carpal bones involving the navicular acute cuboid lateral middle cuneiforms but essentially unchanged from her prior study which was done on 10/29/2018. The findings were felt to represent chronic osteomyelitis. We did inflammatory markers on her. Her white count was 5.25 sedimentation rate 16 and C-reactive protein at 11.1. Notable for the fact that in August 2020 her CRP was 9.9 and sedimentation rate 56. I have looked at her x-rays. It is true that the bone destruction is very impressive however the patient came into this clinic for the wound on her right foot with pieces of bone literally falling out anteriorly with purulent material. I am not exactly sure  I could have expected anything different. She has not been systemically unwell no fever chills or blood sugars have been reasonable. 6/28; she arrives with a right heel closed. The substantial area on the right anterior foot looks healthy. Much better looking surface. I think we can change to [facility] seems to help this previously. She is getting her antibiotics at dialysis she should be just about finished 7/9; changed to [facility] last week. Surface wound looks satisfactory not much change in surface area however. She is going to [place] state next week this is usually a difficult thing for this patient follow-up will be for 2 weeks. 7/23; using Hydrofera Blue. She returns from her trip and the wound looks surprisingly good. Usually when this patient goes on trips she comes back with a lot of problems with the wound. She is saying that she sometimes feels an episodic "crunching" feeling on the lateral part of the foot. She is neuropathic and not feeling pain but wonders whether this could be a neuropathic dysesthesia. 11/13/19-Patient returns after 3 weeks, the wound itself is stable and patient states that there is nothing new going on she is on some extra anxiety medications and  is resisting the temptation to pick at the dry skin around the wound. 9/20; patient has not been here in over a month and I have not seen her in 2 months. The wound in terms of size I think is about the same. There is no exposed bone. She has a nonviable surface on this. She is supposed to be using [facility] however she is also been using some form of honey preparation as well as a silver-based dressings. I do not think she has any pattern to this. 10/4; 2-week follow-up. Patient has been using some form of spray which she says has honey and silver to purchase this online she has been covering it with gauze. In spite of this the wound actually looks quite good. The deeper divot distally appears to be close down. There is a rim of epithelialization. 10/18; 2-week follow-up. Patient has been using her Hydrofera Blue covered with her silver honey spray that she got online. 11/1; 2-week follow-up. She is using Hydrofera Blue with a silver honey spray. Wound bed is measuring smaller. She has noticed that her foot is warmer on the right. She is concerned about infection. For a long period of time I had her on prophylactic trimethoprim sulfamethoxazole DS 1 tablet daily. She is asking for this to be restarted. The patient is walking on this foot because of repairs that are being done in a home her but her room is on the second floor she has to go up and down stairs. I have cautioned against this however as usual she will do exactly what she wants to do 11/15; 2-week follow-up. She uses Hydrofera Blue with a silver/honey spray which I have never heard of. I think her wound looks about the same. Some epithelialization. No evidence that this is infected. I think she is walking on this more than we agreed on. She is going on extensive vacation over Thanksgiving 12/3; 2-week follow-up. She is using Hydrofera Blue however over Thanksgiving she ran out of this and she is simply been using Medihoney. In spite of  this her wound is smaller almost divided into 2 now. She traveled extensively over Thanksgiving and actually looks quite good in spite of this. Usually this is been a marker of problems for her 12/17; 2-week follow-up. She is  using Hydrofera Blue. The wound is smaller. Debris on the surface of this is fibrinous. She is traveling to [place] over the holidays which never bodes well for her wounds. I think she is walking more on her feet then she is even willing to admit and she tells me she does walk 2/11; using Hydrofera Blue. Her wounds are contracting however she walks in the clinic with a history that she has not been able to eat she has had vomiting. She also has right eye problems. Her blood sugar was 567. She had blood work done at dialysis and we called there to get her blood work although it still had not returned although they should have this by the end of the day we were told. She also stated that she was not sure what her blood sugar was as her glucose monitor was not working and not coming to tomorrow. She also for some reason does not think her insulin pump is working well. Her endocrinologist is Dr. Elayne Snare 06/03/2020 upon evaluation today patient actually appears to be doing excellent in regard to her wound. In fact she tells me it was not even bleeding until she picked a dry piece of skin off while we were getting ready to come in and see her today. Fortunately there is no signs of active infection which is great news and overall very pleased. She is going to be seeing her neurologist sometime shortly I believe it might even be on Monday. Nonetheless there can proceed with the work-up as far as anything else going on with her eye the fact that her right eyelid is drooping. 3/25; right medial dorsal foot. She has superficial areas here that appear to be fully epithelialized she is using Medihoney and silver alginate and some combination. She has a new area on the mid left foot at  roughly the level of the fifth metatarsal base Electronic Signature(s) Signed: 07/08/2020 4:45:31 PM By: Linton Ham MD Previous Signature: 06/27/2020 9:50:14 AM Version By: Linton Ham MD Entered By: Linton Ham on 07/08/2020 13:20:37 -------------------------------------------------------------------------------- Physical Exam Details Patient Name: Date of Service: Nancy [name] 06/24/2020 12:30 PM Medical Record Number: [id number] Patient Account Number: [id number] Date of Birth/Sex: Treating RN: 12-Jul-1970 (50 y.o. [name] Primary Care Provider: [name], [name] Other Clinician: Referring Provider: Treating Provider/Extender: [name], [name] Weeks in Treatment: 19 Constitutional Patient is hypertensive.. Pulse regular and within target range for patient.Marland Kitchen Respirations regular, non-labored and within target range.. Temperature is normal and within the target range for the patient.Marland Kitchen Appears in no distress. Musculoskeletal There is no obvious deformity. No erythema.. Notes Wound exam Right dorsal medial foot. I think most of these are epithelialized no debridement She had a new area at least new to me on the left midfoot. This is on the base of a subluxed area. Raised callus and skin around the edge I removed with a #3 curette debris from the surface. Hemostasis with direct pressure. Electronic Signature(s) Signed: 06/27/2020 9:50:14 AM By: Linton Ham MD Entered By: Linton Ham on 06/24/2020 13:33:04 -------------------------------------------------------------------------------- Physician Orders Details Patient Name: Date of Service: Nancy [name] 06/24/2020 12:30 PM Medical Record Number: [id number] Patient Account Number: [id number] Date of Birth/Sex: Treating RN: 02-Jul-1970 (50 y.o. 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 Bathing/ Shower/ Hygiene May shower and wash wound with soap and water. Edema Control - Lymphedema / SCD / Other Bilateral Lower Extremities Elevate legs to the level of the heart or above for 30 minutes daily and/or when sitting, a frequency of: - throughout the day Avoid standing for long periods of time. Moisturize legs daily. - with dressing changes Off-Loading Other: - minimal weight bearing right foot Home Health New wound care orders this week; continue Home Health for wound care. May utilize formulary equivalent dressing for wound treatment orders unless otherwise specified. - New wound, Left Plantar Foot Other Home Health Orders/Instructions: - Interim Wound Treatment Wound #43 - Foot Wound Laterality: Right, Medial Cleanser: Wound Cleanser (Home Health) Every Other Day/30 Days Discharge Instructions: Cleanse the wound with wound cleanser or normal saline prior to applying a clean dressing using gauze sponges, not tissue or cotton balls. Prim Dressing: KerraCel Ag Gelling Fiber Dressing, 2x2 in (silver alginate) (Home Health) Every Other Day/30 Days ary Discharge Instructions: Apply silver alginate to wound bed in clinic. pt may use silver honey spray at home Secondary Dressing: Woven Gauze Sponge, Non-Sterile 4x4 in [facility]) Every Other Day/30 Days Discharge Instructions: Apply over primary dressing as directed. Secured With: Elastic Bandage 4 inch (ACE bandage) (Home  Health) Every Other Day/30 Days Discharge Instructions: Secure with ACE bandage as directed. Secured With: [employer], 4.5x3.1 (in/yd) [facility]) Every Other Day/30 Days Discharge Instructions: Secure with Kerlix as directed. Secured With: Paper Tape, 2x10 (in/yd) (Home Health) Every Other Day/30 Days Discharge Instructions: Secure dressing with tape as directed. Wound #50 - Foot Wound Laterality: Plantar, Left Cleanser: Wound Cleanser (Home Health) Every Other Day/30 Days Discharge Instructions: Cleanse the wound with wound cleanser or normal saline prior to applying a clean dressing using gauze sponges, not tissue or cotton balls. Prim Dressing: KerraCel Ag Gelling Fiber Dressing, 2x2 in (silver alginate) (Home Health) Every Other Day/30 Days ary Discharge Instructions: Apply silver alginate to wound bed in clinic. pt may use silver honey spray at home Secondary Dressing: Woven Gauze Sponge, Non-Sterile 4x4 in [facility]) Every Other Day/30 Days Discharge Instructions: Apply over primary dressing as directed. Secured With: Elastic Bandage 4 inch (ACE bandage) (Home Health) Every Other Day/30 Days Discharge Instructions: Secure with ACE bandage as directed. Secured With: [employer], 4.5x3.1 (in/yd) [facility]) Every Other Day/30 Days Discharge Instructions: Secure with Kerlix as directed. Secured With: Paper Tape, 2x10 (in/yd) (Home Health) Every Other Day/30 Days Discharge Instructions: Secure dressing with tape as directed. Electronic Signature(s) Signed: 06/24/2020 6:16:52 PM By: Lorrin Jackson Signed: 06/27/2020 9:50:14 AM By: Linton Ham MD Entered By: Lorrin Jackson on 06/24/2020 13:26:14 -------------------------------------------------------------------------------- Problem List Details Patient Name: Date of Service: Nancy [name] 06/24/2020 12:30 PM Medical Record Number: [id number] Patient Account Number: [id number] Date of  Birth/Sex: Treating RN: [birth year] (50 y.o. [name] Primary Care Provider: [name], [name] Other Clinician: Referring Provider: Treating Provider/Extender: [name], [name] Weeks in Treatment: 30 Active Problems ICD-10 Encounter Encounter Code Description Active Date MDM Diagnosis L97.514 Non-pressure chronic ulcer of  other part of right foot with necrosis of bone 09/03/2018 No Yes E10.621 Type 1 diabetes mellitus with foot ulcer 09/24/2018 No Yes L97.521 Non-pressure chronic ulcer of other part of left foot limited to breakdown of 06/24/2020 No Yes skin Inactive Problems ICD-10 Code Description Active Date Inactive Date M86.671 Other chronic osteomyelitis, right ankle and foot 09/03/2018 09/03/2018 L97.411 Non-pressure chronic ulcer of right heel and midfoot limited to breakdown of skin 09/17/2019 09/17/2019 L97.521 Non-pressure chronic ulcer of other part of left foot limited to breakdown of skin 04/20/2019 04/20/2019 L97.812 Non-pressure chronic ulcer of other part of right lower leg with fat layer exposed 02/24/2019 02/24/2019 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 06/24/2020 12:30 PM Medical Record Number: [id number] Patient Account Number: [id number] Date of Birth/Sex: Treating RN: 11/25/1970 (50 y.o. [name] Primary Care Provider: [name], [name] Other Clinician: Referring Provider: Treating Provider/Extender: [name], [name] Weeks in Treatment: 91 Subjective History of Present Illness (HPI) 50 year old diabetic who is known to have type 1 diabetes which is poorly controlled last hemoglobin A1c was 11%. She comes in with a ulcerated area on the left lateral foot which has been there for over 6 months. Was recently she has been treated by [name] of podiatry who saw her last on 05/28/2016. Review of his notes revealed that the patient had incision and drainage with placement of antibiotic beads to the left foot on 04/11/2016 for possible osteomyelitis of the cuboid bone. Over the last year she's had a history of amputation of the left fifth toe and a femoropopliteal popliteal bypass graft somewhere in April 2017. 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 His note [name] mentions that the patient has been referred to me for further wound care and possibly great candidate for hyperbaric oxygen therapy due to recurrent osteomyelitis. However we do not have any x-rays of biopsy reports confirming this. He has been on several antibiotics including Bactrim and most recently is on doxycycline for an MRSA. I understand, the patient was not a candidate for IV antibiotics as she has had previous PICC lines which resulted in blood clots in both arms. There was a x-ray report dated 04/04/2016 on [name] notes which showed evidence of fifth ray resection left foot with osteolytic changes noted to the fourth metatarsal and cuboid bone on the left. 06/13/2016 -- had a left foot x-ray which showed no acute fracture or dislocation and no definite radiographic evidence of osteomyelitis. Advanced osteopenia was seen. 06/20/2016 -- she has noticed a new wound on the right plantar foot in the region where she had a callus before. 06/27/16- the patient did have her x-ray of the right foot which showed no findings to suggest osteomyelitis. She saw her endocrinologist, [name], yesterday. Her A1c in January  was 11. He also indicates mismanagement and noncompliance regarding her diabetes. She is currently on Bactrim for a lip infection. She is complaining of nausea, vomiting and diarrhea. She is unable to articulate the exact orders or dosing of the Bactrim; it is unclear when she will complete this. 07/04/2016 -- results from [facility]  health of ABIs with ankle waveforms were noted from 02/14/2016. The examination done on 06/27/2015 showed noncompressible ABIs with the right being 1.45 and the left being 1.33. The present examination showed a right ABI of 1.19 on the left of 1.33. The conclusion was that right normal ABI in the lower extremity at rest however compared to previous study which was noncompressible ABI may be falsely elevated side suggesting medial calcification. The left ABI suggested medial calcification. 08/01/2016 -- the patient had more redness and pain on her right foot and did not get to come to see as noted she see her PCP or go to the ER and decided to take some leftover metronidazole which she had at home. As usual, the patient does report she feels and is rather noncompliant. 08/08/2016 -- -- x-ray of the right foot -- FINDINGS:Transmetatarsal amputation is noted. No bony destruction is noted to suggest osteomyelitis. IMPRESSION: No evidence of osteomyelitis. Postsurgical changes are seen. MRI would be more sensitive for possible bony changes. Culture has grown Serratia Marcescens -- sensitive to Bactrim, ciprofloxacin, ceftazidime she was seen by [name] on 08/06/2016. He did not find any exposed bone, muscle, tendon, ligament or joint. There was no malodor and he did a excisional debridement in the office. ============ Old notes: 50 year old patient who is known to the wound clinic for a while had been away from the wound clinic since 09/01/2014. Over the last several months she has been admitted to various hospitals including [facility]. She was treated  for a right metatarsal osteomyelitis with a transmetatarsal amputation and this was done about 2 months ago. He has a small ulcerated area on the right heel and she continues to have an ulcerated area on the left plantar aspect of the foot. The patient was recently admitted to the [facility] hospital group between 7/12 and 10/18/2014. she was given 3 weeks of IV vancomycin and was to follow-up with her surgeons at [facility] and also took oral vancomycin for C. difficile colitis. 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 MRI of the left foot done on 10/14/2014 showed no abscess or osteomyelitis. 04/27/15; this is a patient we know from previous stays in the wound care center. She is a type I diabetic I am not sure of her control currently. Since the last time I saw her she is had a right transmetatarsal amputation and has no wounds on her right foot and has no open wounds. She is been followed at the wound care center at [facility] in [place]. She comes today with the desire to undergo hyperbaric treatment locally. Apparently one of her wound care providers in [place] has suggested hyperbarics. This is in response to an MRI from 04/18/15 that showed increased marrow signal and loss of the proximal fifth metatarsal cortex evidence of osteomyelitis with likely early osteomyelitis in the cuboid bone as well. She has a large wound over the base of the fifth metatarsal. She also has a eschar over her the tips of her toes on 1,3 and 5. She does not have peripheral pulses and apparently is going for an angiogram tomorrow which seems reasonable. After this she is going to infectious disease at [facility]. They have been  using Medihoney to the large wound on the lateral  aspect of the left foot to. The patient has known Charcot deformity from diabetic neuropathy. She also has known diabetic PAD. Surprisingly I can't see that she has had any recent antibiotics, the patient states the last antibiotic she had was at the end of November for 10 days. I think this was in response to culture that showed group G strep although I'm not exactly sure where the culture was from. She is also had arterial studies on 03/29/15. This showed a right ABI of 1.4 that was noncompressible. Her left ABI was 0.73. 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 It was not felt that arterial inflow was adequate for healing of a foot ulcer. Her Doppler waveforms looked monophasic ===== READMISSION 02/28/17; this is in an now 50 year old woman we've had at several different occasions in this clinic. 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 She has a remote ex-smoker. She was last seen in this clinic by Dr. Con Memos I think in May. More recently she is been followed by her podiatrist [name] an infectious disease Dr. Megan Salon. She has 2 open wounds the major one is over the right first metatarsal head she also has a wound on the left plantar foot. an MRI of the right foot on 01/01/17 showed a soft tissue ulcer along the plantar aspect of the first metatarsal base consistent with osteomyelitis of the first metatarsal stump. Dr. 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 According to the patient this is been open for slightly over a month. She has been on a combination of Cipro 500 twice a day, Zyvox 600 twice a day and Flagyl 500 3 times a day for over a month now as directed by Dr. Megan Salon. cultures of the right foot earlier this year showed MRSA in January and Serratia in May. January also had a few viridans strep.  Recent x-rays of both feet were done and [name] office and I don't have these reports. The patient has known PAD and has a history of aleft femoropopliteal bypass in April 2017. 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 Dwyane Dee. In spite of this the last hemoglobin A1c I can see is 10.1 on 01/01/2017. She is being referred by 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 She is been using Medihoney to this area and also an area on the left midfoot. She is using healing sandals bilaterally. ABIs in this clinic at the left posterior tibial was 1.1 noncompressible on the right READMISSION Non invasive vascular NOVANT 5/18 Aftercare following surgery of the circulatory system Procedure Note - Interface, External Ris In - 08/13/2016 11:05 AM EDT Procedure: Examination consists of physiologic resting arterial pressures of the brachial and ankle arteries bilaterally with continuous wave Doppler waveform analysis. Previous: Previous exam performed on 02/14/16 demonstrated ABIs of Rt = 1.19 and Lt = 1.33. Right: ABI = non-compressible PT 1.47 DP. S/P transmet amputation. , Left: ABI = 1.52, 2nd digit pressure = 87 mmHg Conclusions: Right: ABI (>1.3) may be falsely elevated, suggesting medial calcification. Left: ABI (>1.3) may be falsely elevated, suggesting medial calcification The patient is a now 50 year old type I diabetic is had multiple issues her  graded to chronic diabetic foot ulcers. 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 We had her in the clinic lastin November. At that point she had wounds on her bilateral feet.she had wanted to try hyperbarics however the healogics  review process denied her because she hadn't followed up with her vascular surgeon for her left femoropopliteal bypass. The bypass was done by Dr. Raul Del at [facility]. We made her a follow-up with Dr. Raul Del however she did not keep the appointment and therefore she was not approved The patient shows me a small wound on her left fourth metatarsal head on her phone. She developed rapid discoloration in the plantar aspect of the left foot and she was admitted to hospital from 2/2 through 05/10/17 with wet gangrene of the left foot osteomyelitis of the fourth metatarsal heads. She was admitted acutely ill with a temperature of 103. She was started on broad-spectrum vancomycin and cefepime. On 05/06/17 she was taken to the OR by [name] her podiatric surgeon for an incision and drainage irrigation of the left foot wound. Cultures from this surgery revealed group be strep and anaerobes. she was seen by Dr.Xu of orthopedic surgery and scheduled for a below-knee amputation which she u refused. Ultimately she was discharged on Levaquin and Flagyl for one month. MRI 05/05/17 done while she was in the hospital showed abscess adjacent to the fourth metatarsal head and neck small abscess around the fourth flexor tendon. Inflammatory phlegmon and gas in the soft tissues along the lateral aspect of the fourth phalanx. Findings worrisome for osteomyelitis involving the fourth proximal and middle phalanx and also the third and fourth metatarsals. Finally the patient had actually shortly before this followed up with Dr. Raul Del at no time on 04/29/17. He felt that her left femoropopliteal bypass was patent he felt that her left-sided toe pressures more than adequate for healing a wound on the left foot. This was before her acute presentation. 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 05/28/17; she is started hyperbarics. The patient tells me that for some reason she was not actually on Levaquin but I think on  ciprofloxacin. She was on Flagyl. She only started her Levaquin yesterday due to some difficulty with the pharmacy and perhaps her sister picking it up. She has an appointment with [name] tomorrow and with infectious disease early next week. She has no new complaints 06/06/17; the patient continues in hyperbarics. She saw [name] on 05/29/17 who is her podiatric surgeon. He is elected for a transmetatarsal amputation on 06/27/17. I'm not sure at what level he plans to do this amputation. The patient is unaware ooShe also saw Dr. Megan Salon of infectious disease who elected to continue her on current antibiotics I think this is ciprofloxacin and Flagyl. I'll need to clarify with her tomorrow if she actually has this. We're using silver alginate to the actual wound. Necrotic surface today with material under the flap of her foot. ooOriginal MRI showed abscesses as well as osteomyelitis of the proximal and middle fourth phalanx and the third and fourth metatarsal heads 06/11/17; patient continues in hyperbarics and continues on oral antibiotics. She is doing well. The wound looks better. The necrotic part of this under the flap in her superior foot also looks better. she is been to see [name]. I haven't had a chance to look at his note. Apparently he has put the transmetatarsal amputation on hold her request it is still planning  to take her to the OR for debridement and product application ACEL. I'll see if I can find his note. I'll therefore leave product ordering/requests to [name] for now. I was going to look at Dermagraft 06/18/17-she is here in follow-up evaluation for bilateral foot wounds. She continues with hyperbaric therapy. She states she has been applying manuka honey to the right plantar foot and alternate manuka honey and silver alginate to the left foot, despite our orders. We will continue with same treatment plan and she will follo up next week. 06/25/17; I have reviewed [name]  last note from 3/11. She has operative debridement in 2 days' time. By review his note apparently they're going to place there is skin over the majority of this wound which is a good choice. She has a small satellite area at the most proximal part of this wound on the left plantar foot. The area on the right plantar foot we've been using silver alginate and it is close to healing. 07/02/17; unfortunately the patient was not easily approved for [name] proposed surgery. I'm not completely certain what the issue is. She has been using silver alginate to the wound she has completed a first course of hyperbarics. She is still on Levaquin and Flagyl. I have really lost track of the time course here.I suspect she should have another week to 2 of antibiotics. I'll need to see if she is followed up with infectious disease Dr. Megan Salon 07/09/17; the patient is followed up with Dr. Megan Salon. 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 She continues on Levaquin and metronidazole continuing both of these for now I think she is been on fr about 6 weeks. She still has some drainage but no pain. No fever. Her had been plans for her to go to the OR for operative debridement with her podiatrist [name], I am not exactly sure where that is. I'll probably slip a note to [name] today. I note that she follows with Dr. Dwyane Dee of endocrinology. We have her recertified for hyperbaric oxygen. I have not heard about Dermagraft however I'll see if [name] is planning a skin substitute as well 07/16/17; the patient tells me she is just about out of [place]. I'll need to check Dr. Hale Bogus last notes on this. She states she has plenty of Flagyl however. She comes in today complaining of pain in the right lateral foot which she said lasted for about a day. The wound on the right foot is actually much more medially. She also tells me that the [facility] cost a lot of pain in the  left foot wound and she turned back to silver alginate. Finally Dermagraft has a $427 per application co-pay. She cannot afford this 07/23/17; patient arrives today with the wound not much smaller. There is not much new to add. She has not heard from [name] all try to put in a call to them today. She was asking about Dermagraft again and she has an over $062 per application co-pay she states that she would be willing to try to do a payment plan. I been tried to avoid this. We've been using silver alginate, I'll change to [facility] 07/30/17-She is here in follow-up evaluation for left foot ulcer. She continues hyperbaric medicine. The left foot ulcer is stable we will continue with same treatment plan 08/06/17; she is here for evaluation of her left foot ulcer. Currently being treated for  hyperbarics or underlying osteomyelitis. She is completed antibiotics. The left foot ulcer is better smaller with healthier looking granulation. For various reasons I am not really clear on we never got her back to the OR with [name]. He did not respond to my secure text message. Nevertheless I think that surgery on this point is not necessary nor am I completely clear that a skin substitute is necessary The patient is complaining about pain on the outside of her right foot. 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 There is no erythema. She also states the foot is warm versus her other part of her upper leg and this is largely true. It is not totally clear to me what's causing this. She thinks it's different from her usual neuropathy pain 08/13/17; she arrives in clinic today with a small wound which is superficial on her right first metatarsal head. 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 She tells [place] she was up on her feet over the Mother's Day celebration. ooThe large wound is on the left foot. Continues with hyperbarics for underlying osteomyelitis. We're using Hydrofera  Blue. She asked me today about where we were with Dermagraft. I had actually excluded this because of the co-pay however she wants to assume this therefore I'll recheck the co-pay an order for next week. 08/20/17; the patient agreed to accept the co-pay of the first Apligraf which we applied today. She is disappointed she is finishing hyperbarics will run this through the insurance on the extent of the foot infection and the extent of the wound that she had however she is already had 60 dive's. Dermagraft No. 1 08/27/17; Dermagraft No. 2. She is not eligible for any more hyperbaric treatments this month. She reports a fair amount of drainage and she actually changed to the external dressings without disturbing the direct contact layer 09/03/17; the patient arrived in clinic today with the wound superficially looking quite healthy. Nice vibrant red tissue with some advancing epithelialization although not as much adherence of the flap as I might like. However she noted on her own fourth toe some bogginess and she brought that to our attention. Indeed this was boggy feeling like a possibility of subcutaneous fluid. 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 She is not been unwell. We've been using Dermagraft 09/10/17; the culture that I did not last week was MRSA. She saw Dr. Megan Salon this morning who is going to start her on vancomycin. I had sent him a secure a text message yesterday. I also spoke with her podiatric surgeon [name] about surgery on this foot the options for conserving a functional foot etc. Promised me he would see her and will make back consultation today. Paradoxically her actual wound on the plantar aspect of her left foot looks really quite good. I had given her 5 days worth of Baxdella to cover her for MRSA. Her MRI came back showing osteomyelitis within the third metatarsal shaft and head and base of the third  and fourth proximal phalanx. She had extensive inflammatory changes throughout the soft tissue of the lateral forefoot. With an ill-defined fluid around the fourth metatarsal extending into the plantar and dorsal soft tissues 09/19/17; the patient is actually on oral Septra and Flagyl. She apparently refused IV vancomycin. She also saw [name] at my request who is planning her for a left BKA sometime in mid July.  MRI showed osteomyelitis within the third metatarsal shaft and head and the basis of the third and fourth proximal phalanx. I believe there was felt to be possible septic arthritis involving the third MTP. 09/26/17; the patient went back to Dr. Megan Salon at my suggestion and is now receiving IV daptomycin. Her wound continues to look quite good making the decision to proceed with a transmetatarsal amputation although more difficult for the patient. I believe in my extensive discussions with her she has a good sense of the pros and cons of this. I don't NV the tuft decision she has to make. She has an appointment with [name] I believe in mid July and I previously spoken to him about this issue Has we had used 3 previous Dermagraft. Given the condition of the wound surface I went ahead and added the fourth one today area and I did this not fully realizing that she'll be traveling to [place] next week. I'm hopeful she can come back in 2 weeks 10/21/17; Her same Dermagraft on for about 3-1/2 weeks. In spite of this the wound arrives looking quite healthy. There is been a lot of healing dimensions are smaller. Looking at the square shaped wound she has now there is some undermining and some depth medially under the undermining although I cannot palpate any bone. No surrounding infection is obvious. She has difficult questions about how to look at this going forward vis--vis amputations versus continued medical therapy. T be truthful the wound is looks so o healthy and it is continued to  contract. Hard to justify foot surgery at this point although I still told her that I think it might come to that if we are not able to eradicate the underlying MRSA. She is still highly at risk and she understands this 11/06/17 on evaluation today patient appears to be doing better in regard to her foot ulcer. She's been tolerating the dressing changes without complication. Currently she is here for her Dermagraft #6. Her wound continues to make excellent progress at this point. She does not appear to have any evidence of infection which is good news. 11/13/17 on evaluation today patient appears to be doing excellent at this time. She is here for repeat Dermagraft application. This is #7. Overall her wound seems to be making great progress. 12/05/17; the patient arrives with the wound in much better condition than when I last saw this almost 6 weeks ago. She still has a small probing area in the left metatarsal head region on the lateral aspect of her foot. We applied her last Dermagraft today. ooSince the last time she is here she has what appears to a been a blood blister on the plantar aspect of left foot although I don't see this is threatening. There is also a thick raised tissue on the right mid metatarsal head region. This was not there I don't think the last time she was here 3 weeks ago. 12/12/17; the patient continues to have a small programming area in the left metatarsal head region on the lateral aspect of her foot which was the initial large surgical wound. I applied her last Apligraf last week. I'm going to use Endoform starting today ooUnfortunately she has an excoriated area in the left mid foot and the right mid foot. The left midfoot looks like a blistered area this was not opened last week it certainly is open today. Using silver alginate on these areas. She promises me she is offloading this. 12/19/17; the small probing  area in the left metatarsal head eyes think is shallower. In  general her original wound looks better. We've been using Endoform. The area inferiorly that I think was trauma last week still requires debridement a lot of nonviable surface which I removed. She still has an open open area distally in her foot ooSimilarly on the right foot there is tightly adherent surface debris which I removed. Still areas that don't look completely epithelialized. This is a small open area. We used silver alginate on these areas 12/26/2017; the patient did not have the supplies we ordered from last week including the Endoform. The original large wound on the left lateral foot looks healthy. She still has the undermining area that is largely unchanged from last week. She has the same heavily callused raised edged wounds on the right mid and left midfoot. Both of these requiring debridement. We have been using silver alginate on these areas 01/02/2018; there is still supply issues. We are going to try to use Prisma but I am not sure she actually got it from what she is saying. She has a new open area on the lateral aspect of the left fourth toe [previous fifth ray amputation]. Still the one tunneling area over the fourth metatarsal head. The area is in the midfoot bilaterally still have thick callus around them. She is concerned about a raised swelling on the lateral aspect of the foot. However she is completely insensate 01/10/2018; we are using Prisma to the wounds on her bilateral feet. Surprisingly the tunneling area over the left fourth metatarsal head that was part of her original surgery has closed down. She has a small open area remaining on the incision line. 2 open areas in the midfoot. 02/10/2018; the patient arrives back in clinic after a month hiatus. She was traveling to visit family in [place]. Is fairly clear she was not offloading the areas on her feet. The original wound over the left lateral foot at the level of metatarsal heads is reopened and probes medially by  about a centimeter or 2. She notes that a week ago she had purulent drainage come out of an area on the left midfoot. Paradoxically the worst area is actually on the right foot is extensive with purulent drainage. We will use silver alginate today 02/17/2018; the patient has 3 wounds one over the left lateral foot. She still has a small area over the metatarsal heads which is the remnant of her original surgical wound. This has medial probing depth of roughly 1.4 cm somewhat better than last week. The area on the right foot is larger. We have been using silver alginate to all areas. The area on the right foot and left foot that we cultured last week showed both Klebsiella and Proteus. Both of these are quinolone sensitive. The patient put her's self on Bactrim and Flagyl that she had left hanging around from prior antibiotic usages. She was apparently on this last week when she arrived. I did not realize this. Unfortunately the Bactrim will not cover either 1 of these organisms. We will send in Cipro 500 twice daily for a week 03/04/2018; the patient has 2 wounds on the left foot one is the original wound which was a surgical wound for a deep DFU. At one point this had exposed bone. She still has an area over the fourth metatarsal head that probes about 1.4 cm although I think this is better than last week. I been using silver nitrate to try and promote  tissue adherence and been using silver alginate here. ooShe also has an area in the left midfoot. This has some depth but a small linear wound. Still requiring debridement. ooOn the right midfoot is a circular wound. A lot of thick callus around this area. ooWe have been using silver alginate to all wound areas ooShe is completed the ciprofloxacin I gave her 2 weeks ago. 03/11/2018; the patient continues to have 2 open areas on the left foot 1 of which was the original surgical wound for a deep DFU. Only a small probing area remains although this  is not much different from last week we have been using silver alginate. The other area is on the midfoot this is smaller linear but still with some depth. We have been using silver alginate here as well ooOn the right foot she has a small circular wound in the mid aspect. This is not much smaller than last time. We have been using silver alginate here as well 03/18/2018; she has 3 wounds on the left foot the original surgical wound, a very superficial wound in the mid aspect and then finally the area in the mid plantar foot. She arrives in today with a very concerning area in the wound in the mid plantar foot which is her most proximal wound. There is undermining here of roughly 1-1/2 cm superiorly. Serosanguineous drainage. She tells me she had some pain on for over the weekend that shot up her foot into her thigh and she tells me that she had a nodule in the groin area. ooShe has the single wound in the right foot. ooWe are using endoform to both wound areas 03/24/2018; the patient arrives with the original surgical wound in the area on the left midfoot about the same as last week. There is a collection of fluid under the surface of the skin extending from the surgical wound towards the midfoot although it does not reach the midfoot wound. The area on the right foot is about the same. Cultures from last week of the left midfoot wound showed abundant Klebsiella abundant Enterococcus faecalis and moderate methicillin resistant staph I gave her Levaquin but this would have only covered the Klebsiella. She will need linezolid 04/01/2018; she is taking linezolid but for the first few days only took 1 a day. I have advised her to finish this at twice daily dosing. In any case all of her wounds are a lot better especially on the left foot. The original surgical wound is closed. The area on the left midfoot considerably smaller. The area on the right foot also smaller. 04/08/2018; her original surgical  wound/osteomyelitis on the left foot remains closed. She has area on the left foot that is in the midfoot area but she had some streaking towards this. This is not connected with her original wound at least not visually. ooSmall wound on the right midfoot appears somewhat smaller. 04/15/18; both wounds looks better. Original wound is better left midfoot. Using silver alginate 1/21; patient states she uses saltwater soak in, stones or remove callus from around her wounds. She is also concerned about a blood blister she had on the left foot but it simply resolved on its own. We've been using silver alginate 1/28; the patient arrives today with the same streaking area from her metatarsals laterally [the site of her original surgical wound] down to the middle of her foot. There is some drainage in the subcutaneous area here. This concerns me that there is actually continued ongoing  infection in the metatarsals probably the fourth and third. This fixates an MRI of the foot without contrast [chronic renal failure] ooThe wound in the mid part of the foot is small but I wonder whether this area actually connects with the more distal foot. ooThe area on the right midfoot is probably about the same. Callus thick skin around the small wound which I removed with a curette we have been using silver alginate on both wound areas 2/4; culture I did of the draining site on the left foot last time grew methicillin sensitive staph aureus. MRI of the left foot showed interval resolution of the findings surrounding the third metatarsal joint on the prior study consistent with treated osteomyelitis. Chronic soft tissue ulceration in the plantar and lateral aspect of the forefoot without residual focal fluid collection. No evidence of recurrent osteomyelitis. 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 She tells me she had nausea and vomiting over the weekend and missed 2 days. In general her foot looks much the same. She has a small open area just below the left fourth metatarsal head. A linear area in the left midfoot. Some discoloration extending from the inferior part of this into the left lateral foot although this appears to be superficial. She has a small area on the right midfoot which generally looks smaller after debridement 2/18; the patient is completing his cephalexin and has another 2 days. She continues to have open areas on the left and right foot. 2/25; she is now off antibiotics. The area on the left foot at the site of her original surgical wound has closed yet again. She still has open areas in the mid part of her foot however these appear smaller. The area on the right mid foot looks about the same. We have been using silver alginate She tells me she had a serious hypoglycemic spell at home. She had to have EMS called and get IV dextrose 3/3; disappointing on the left lateral foot large area of necrotic tissue surrounding the linear area. This appears to track up towards the same original surgical wound. Required extensive debridement. The area on the right plantar foot is not a lot better also using silver 3/12; the culture I did last time showed abundant enterococcus. I have prescribed Augmentin, should cover any unrecognized anaerobes as well. In addition there were a few MRSA and Serratia that would not be well covered although I did not want to give her multiple antibiotics. She comes in today with a new wound in the right midfoot this is not connected with the original wound over her MTP a lot of thick callus tissue around both wounds but once again she said she is not walking on these areas 3/17-Patient comes in for follow-up on the bilateral  plantar wounds, the right midfoot and the left plantar wound. Both these are heavily callused surrounding the wounds. We are continuing to use silver alginate, she is compliant with offloading and states she uses a wheelchair fairly often at home 3/24; both wound areas have thick callus. However things actually look quite a bit better here for the majority of her left foot and the right foot. 3/31; patient continues to have thick callused somewhat irritated looking tissue around the wounds  which individually are fairly superficial. There is no evidence of surrounding infection. We have been using silver alginate however I change that to [facility] today 4/17; patient returns to clinic after having a scare with Covid she tested negative in her primary doctor's office. She has been using Hydrofera Blue. She does not have an open area on the right foot. On the left foot she has a small open area with the mid area not completely viable. She showed me pictures of what looks like a hemorrhagic blister from several days ago but that seems to have healed over this was on the lateral left foot 4/21; patient comes in to clinic with both her wounds on her feet closed. However over the weekend she started having pain in her right foot and leg up into the thigh. She felt as though she was running a low-grade fever but did not take her temperature. She took a doxycycline that she had leftover and yesterday a single Septra and metronidazole. She thinks things feel somewhat better. 4/28; duplex ultrasound I ordered last week was negative for DVT or superficial thrombophlebitis. She is completed the doxycycline I gave her. States she is still having a lot of pain in the right calf and right ankle which is no better than last week. She cannot sleep. She also states she has a temperature of up to 101, coughing and complaining of visual loss in her bilateral eyes. Apparently she was tested for Covid 2 weeks ago at  [facility] and that was negative. Readmission: 09/03/18 patient presents back for reevaluation after having been evaluated at the end of April regarding erythema and swelling of her right lower extremity. Subsequently she ended up going to the hospital on 07/29/18 and was admitted not to be discharged until 08/08/18. Unfortunately it was noted during the time that she was in the hospital that she did have methicillin-resistant Staphylococcus aureus as the infection noted at the site. It was also determined that she did have osteomyelitis which appears to be fairly significant. She was treated with vancomycin and in fact is still on IV vancomycin at dialysis currently. This is actually slated to continue until 09/12/18 at least which will be the completion of the six weeks of therapy. Nonetheless based on what I'm seeing at this point I'm not sure she will be anywhere near ready to discontinue antibiotics at that time. Since she was released from the hospital she was seen by [name] who is her podiatrist on 08/27/18. His note specifically states that he is recommended that the patient needs of one knee amputation on the right as she has a life- threatening situation that can lead quickly to sepsis. The patient advised she would like to try to save her leg to which [name] apparently told her that this was against all medical advice. She also want to discontinue the Wound VAC which had been initiated due to the fact that she wasn't pleased with how the wound was looking and subsequently she wanted to pursue applying Medihoney at that time. He stated that he did not believe that the right lower extremity was salvageable and that the patient understood but would still like to attempt hyperbaric option therapy if it could be of any benefit. She was therefore referred back to [place] for further evaluation. He plans to see her back next week. Upon inspection today patient has a significant amount purulent drainage noted  from the wound at this point. The bone in the distal portion  of her foot also appears to be extremely necrotic and spongy. When I push down on the bone it bubbles and seeps purulent drainage from deeper in the end of the foot. I do not think that this is likely going to heal very well at all and less aggressive surgical debridement were undertaken more than what I believe we can likely do here in our office. 09/12/2018; I have not seen this patient since the most recent hospitalization although she was in our clinic last week. I have reviewed some of her records from a complex hospitalization. 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 There is situation was complicated by MRSA bacteremia and acute on chronic renal failure now on dialysis. She is receiving vancomycin at dialysis. We started her on Dakin's wet-to-dry last week she is changing this daily. There is still purulent drainage coming out of her foot. Although she is apparently "agreeable" to a below-knee amputation which is been suggested by multiple clinicians she wants this to be done in [place]. She apparently has a telehealth visit with that provider sometime in late [place] 6/24. I have told her I think this is probably too long. Nevertheless I could not convince her to allow a local doctor to perform BKA. 09/19/2018; the patient has a large necrotic area on the right anterior foot. She has had previous transmetatarsal amputations. Culture I did last week showed MRSA nothing else she is on vancomycin at dialysis. She has continued leaking purulent drainage out of the distal part of the large circular wound on the right anterior foot. She apparently went to see Dr. 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 Somehow that translated into her being referred to plastic surgery for debridement of the area. I gather she basically refused amputation although I do not  have a copy of Dr. Berenice Primas notes. The patient really wants to have a trial of hyperbaric oxygen. I agreed with initial assessment in this clinic that this was probably too far along to benefit however if she is going to have plastic surgery I think she would benefit from ancillary hyperbaric oxygen. The issue here is that the patient has benefited as maximally as any patient I have ever seen from hyperbaric oxygen therapy. Most recently she had exposed bone on the lateral part of her left foot after a surgical procedure and that actually has closed. She has eschared areas in both heels but no open area. She is remained systemically well. I am not optimistic that anything can be done about this but the patient is very clear that she wants an attempt. The attempt would include a wound VAC further debridements and hyperbaric oxygen along with IV antibiotics. 6/26; I put her in for a trial of hyperbaric oxygen only because of the dramatic response she has had with wounds on her left midfoot earlier this year which was a surgical wound that went straight to her bone over the metatarsal heads and also remotely the left third toe. We will see if we can get this through our review process and insurance. She arrives in clinic with again purulent material pouring out of necrotic bone on the top of the foot distally. There is also some concerning erythema on the front of the leg that we marked. It is bit difficult to tell how tender this is because of neuropathy. I note from infectious disease that she had her vancomycin extended. All the cultures of these  areas have shown MRSA sensitive to vancomycin. She had the wound VAC on for part of the week. The rest of the time she is putting various things on this including Medihoney, "ionized water" silver sorb gel etc. 7/7; follow-up along with HBO. She is still on vancomycin at dialysis. She has a large open area on the dorsal right foot and a small dark eschar area on  her heel. There is a lot less erythema in the area and a lot less tenderness. From an infection point of view I think this is better. She still has a lot of necrosis in the remaining right forefoot [previous TMA] we are still using the wound VAC in this area 7/16; follow-up along with HBO. I put her on linezolid after she finished her vancomycin. We started this last Friday I gave her 2 weeks worth. I had the expectation that she would be operatively debrided by Dr. Marla Roe but that still has not happened yet. Patient phoned the office this week. She arrives for review today after HBO. The distal part of this wound is completely necrotic. Nonviable pieces of tendon bone was still purulent drainage. Also concerning that she has black eschar over the heel that is expanding. I think this may be indicative of infection in this area as well. She has less erythema and warmth in the ankle and calf but still an abnormal exam 7/21 follow-up along with HBO. I will renew her linezolid after checking a CBC with differential monitoring her blood counts especially her platelets. She was supposed to have surgery yesterday but if I am reading things correctly this was canceled after her blood sugar was found to be over 500. I thought Dr. Marla Roe who called me said that they were sending her to the ER but the patient states that was not the case. 7/28. Follow-up along with HBO. She is on linezolid I still do not have any lab work from dialysis even though I called last week. The patient is concerned about an area on her left lateral foot about the level of the base of her fifth metatarsal. I did not really see anything that ominous here however this patient is in [place] ability to point out problems that she is sensing and she has been accurate in the past Finally she received a call from Dr. Marla Roe who is referring her to another orthopedic surgeon stating that she is too booked up to take her to the  operating room now. Was still using a wound VAC on the foot 8/3 -Follow-up after HBO, she is got another week of linezolid, she is to call ID for an appointment, x-rays of both feet were reviewed, the left foot x-ray with third MTP joint osteo- Right foot x-ray widespread osteo-in the right midfoot Right ankle x-ray does not show any active evidence of infection 8/11-Patient is seen after HBO, the wounds on the right foot appear to be about the same, the heel wound had some necrotic base over tendon that was debrided with a curette 8/21; patient is seen after HBO. The patient's wound on her dorsal foot actually looks reasonably good and there is substantial amount of epithelialization however the open area distally still has a lot of necrotic debris partially bone. I cannot really get a good sense of just how deep this probes under the foot. She has been pressuring me this week to order medical maggots through a company in [place] for her. The problem I have is there is not a  defined wound area here. On the positive side there is no purulence. She has been to see infectious disease she is still on Septra DS although I have not had a chance to review their notes 8/28; patient is seen in conjunction with HBO. The wounds on her foot continued to improve including the right dorsal foot substantially the, the distal part of this wound and the area on the right heel. We have been using a wound VAC over this chronically. She is still on trimethoprim as directed by infectious disease 9/4; patient is seen in conjunction with HBO. Right dorsal foot wound substantially anteriorly is better however she continues to have a deep wound in the distal part of this that is not responding. We have been using silver collagen under border foam ooArea on the right plantar medial heel seems better. We have been using Hydrofera Blue 12/12/18 on evaluation today patient appears to be doing about the same with regard to  her wound based on prior measurements. She does have some necrotic tissue noted on the lateral aspect of the wound that is going require a little bit of sharp debridement today. This includes what appears to be potentially either severely necrotic bone or tendon. Nonetheless other than that she does not appear to have any severe infection which is good news 9/18; it is been 2 weeks since I saw this wound. She is tolerating HBO well. Continued dramatic improvement in the area on the right dorsal foot. She still has a small wound on the heel that we have been using Hydrofera Blue. She continues with a wound VAC 9/24; patient has to be seen emergently today with a swelling on her right lateral lower leg. She says that she told Dr. Evette Doffing about this and also myself on a couple of occasions but I really have no recollection of this. She is not systemically unwell and her wound really looked good the last time I saw this. She showed this to providers at dialysis and she was able to verify that she was started on cephalexin today for 5 doses at dialysis. She dialyzes on Tuesday Thursday and Saturday. 10/2; patient is seen in conjunction with HBO. The area that is draining on the right anterior medial tibia is more extensive. Copious amounts of serosanguineous drainage with some purulence. We are still using the wound VAC on the original wound then it is stable. Culture I did of the original IandD showed MRSA I contacted dialysis she is now on vancomycin with dialysis treatments. I asked them to run a month 10/9; patient seen in conjunction with HBO. She had a new spontaneous open area just above the wound on the right medial tibia ankle. More swelling on the right medial tibia. Her wound on the foot looks about the same perhaps slightly better. There is no warmth spreading up her leg but no obvious erythema. her MRI of the foot and ankle and distal tib-fib is not booked for next Friday I discussed this  with her in great detail over multiple days. it is likely she has spreading infection upper leg at least involving the distal 25% above the ankle. She knows that if I refer her to orthopedics for infectious disease they are going to recommend amputation and indeed I am not against this myself. We had a good trial at trying to heal the foot which is what she wanted along with antibiotics debridement and HBO however she clearly has spreading infection [probably staph aureus/MRSA]. Nevertheless she once  again tells me she wants to wait the left of the MRI. She still makes comments about having her amputation done in [place]. 10/19; arrives today with significant swelling on the lateral right leg. Last culture I did showed Klebsiella. Multidrug-resistant. Cipro was intermediate sensitivity and that is what I have her on pending her MRI which apparently is going to be done on Thursday this week although this seems to be moving back and forth. She is not systemically unwell. We are using silver alginate on her major wound area on the right medial foot and the draining areas on the right lateral lower leg 10/26; MRI showed extensive abscess in the anterior compartment of the right leg also widespread osteomyelitis involving osseous structures of the midfoot and portions of the hindfoot. Also suspicion for osteomyelitis anterior aspect of the distal medial malleolus. Culture I did of the purulence once again showed a multidrug-resistant Klebsiella. I have been in contact with nephrology late last week and she has been started on cefepime at dialysis to replace the vancomycin We sent a copy of her MRI report to Dr. Geroge Baseman in [place] who is an orthopedic surgeon. The patient takes great stock in his opinion on this. She says she will go to [place] to have her leg amputated if Dr. Geroge Baseman does not feel there is any salvage options. 11/2; she still is not talk to her orthopedic surgeon in [place].  Apparently he will call her at [phone] this afternoon. The quality of this is she has not allowed me to refer her anywhere. She has been told over and over that she needs this amputated but has not agreed to be referred. She tells me her blood sugar was 600 last night but she has not been febrile. 11/9; she never did got a call from the orthopedic surgeon in [place] therefore that is off the radar. We have arranged to get her see orthopedic surgery at [facility]. She still has a lot of draining purulence coming out of the new abscess in her right leg although that probably came from the osteomyelitis in her right foot and heel. Meanwhile the original wound on the right foot looks very healthy. Continued improvement. The issue is that the last MRI showed osteomyelitis in her right foot extensively she now has an abscess in the right anterior lower leg. There is nobody in [place] who will offer this woman anything but an amputation and to be honest that is probably what she needs. I think she still wants to talk about limb salvage although at this point I just do not see that. She has completed her vancomycin at dialysis which was for the original staph aureus she is still on cefepime for the more recent Klebsiella. She has had a long course of both of these antibiotics which should have benefited the osteomyelitis on the right foot as well as the abscess. 11/16; apparently [place] elective surgery is shut down because of COVID-19 pandemic. I have reached out to some contacts at [facility] to see if we can get her an orthopedic appointment there. I am concerned about continually leaving this but for the moment everything is static. In fact her original large wound on this foot is closing down. It is the abscess on the right anterior leg that continues to drain purulent serosanguineous material. She is not currently on any antibiotics however she had a prolonged course of vancomycin [1 month] as well  as cefepime for a month 02/24/2019 on evaluation today patient appears to  be doing better than the last time I saw her. This is not a patient that I typically see. With that being said I am covering for Dr. Dellia Nims this week and again compared to when I last saw her overall the wounds in particular seem to be doing significantly better which is good news. With that being said the patient tells me several disconcerting things. She has not been able to get in to see anyone for potential debridement in regard to her leg wounds although she tells me that she does not think it is necessary any longer because she is taking care of that herself. She noticed a string coming out of the lower wound on her leg over the last week. The patient states that she subsequently decided that we must of pack something in there and started pulling the string out and as it kept coming and coming she realized this was likely her tendon. With that being said she continued to remove as much of this as she could. She then I subsequently proceeded to using tubes of antibiotic ointment which she will stick down into the wound and then scored as much as she can until she sees it coming out of the other wound opening. She states that in doing this she is actually made things better and there is less redness and irritation. With regard to her foot wound she does have some necrotic tendon and tissue noted in one small corner but again the actual wound itself seems to be doing better with good granulation in general compared to my last evaluation. 12/7; continued improvement in the wound on the substantial part of the right medial foot. Still a necrotic area inferiorly that required debridement but the rest of this looks very healthy and is contracting. She has 2 wounds on the right lateral leg which were her original drainage sites from her abscess but all of this looks a lot better as well. She has been using silver alginate after  putting antibiotic biotic ointment in one wound and watching it come out the other. I have talked to her in some detail today. I had given her names of orthopedic surgeons at [facility] for second opinion on what to do about the right leg. I do not think the patient never called them. She has not been able to get a hold of the orthopedic surgeon in [place] that she had put a lot of faith in as being somebody would give her an opinion that she would trust. I talked to her today and said even if I could get her in to another orthopedic surgeon about the leg which she accept an amputation and she said she would not therefore I am not going to press this issue for the moment 12/14; continued improvement in his substantial wound on the right medial foot. There is still a necrotic area inferiorly with tightly adherent necrotic debris which I have been working on debriding each time she is here. She does not have an orthopedic appointment. Since last time she was here I looked over her cultures which were essentially MRSA on the foot wound and gram-negative rods in the abscess on the anterior leg. 12/21; continued improvement in the area on the right medial foot. She is not up on this much and that is probably a good thing since I do not know it could support continuous ambulation. She has a small area on the right lateral leg which were remanence of the IandD's I did  because of the abscess. I think she should probably have prophylactic antibiotics I am going to have to look this over to see if we can make an intelligent decision here. In the meantime her major wound is come down nicely. Necrotic area inferiorly is still there but looks a lot better 04/06/2019; she has had some improvement in the overall surface area on the right medial foot somewhat narrowedr both but somewhat longer. The areas on the right lateral leg which were initial IandD sites are superficial. Nothing is present on the right heel. We  are using silver alginate to the wound areas 1/18; right medial foot somewhat smaller. Still a deep probing area in the most distal recess of the wound. She has nothing open on the right leg. She has a new wound on the plantar aspect of her left fourth toe which may have come from just pulling skin. The patient using Medihoney on the wound on her foot under silver alginate. I cannot discourage her from this 2/1; 2-week follow-up using silver alginate on the right foot and her left fourth toe. The area on the right dorsal foot is contracted although there is still the deep area in the most distal part of the wound but still has some probing depth. No overt infection 2/15; 2-week follow-up. She continues to have improvement in the surface area on the dorsal right foot. Even the tunneling area from last time is almost closed. The area that was on the plantar part of her left fourth toe over the PIP is indeed closed 3/1; 2-week follow-up. Continued improvement in surface area. The original divot that we have been debriding inferiorly I think has full epithelialization although the epithelialization is gone down into the wound with probably 4 mm of depth. Even under intense illumination I am unable to see anything open here. The remanence of the wound in this area actually look quite healthy. We have been using silver alginate 3/15; 2-week follow-up. Unfortunately not as good today. She has a comma shaped wound on the dorsal foot however the upper part of this is larger. Under illumination debris on the surface She also tells [place] that she was on her right leg 2 times in the last couple of weeks mostly to reach up for things above her head etc. She felt a sharp pain in the right leg which she thinks is somewhere from the ankle to the knee. The patient has neuropathy and is really uncertain. She cannot feel her foot so she does not think it was coming from there 3/29; 2-week follow-up. Her wound measures  smaller. Surface of the wound appears reasonable. She is using silver alginate with underlying Medihoney. She has home health. X-rays I did of her tib-fib last time were negative although it did show arterial calcification 4/12; 2-week follow-up. Her wound measures smaller in length. Using manuka honey with silver alginate on top. She has home health. 4/26; 2-week follow-up. Her wound is smaller but still very adherent debris under illumination requiring debridement she has been using manuka honey with silver alginate. 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 Been using Hydrofera Blue. She is on suppressive Bactrim but apparently she has been taking it twice daily 6/7; I have not seen this wound and about 6 weeks. Since then she was up in [place]. By her own admission she was walking on the foot  because she did not have a wheelchair. The wound is not nearly as healthy looking as it was the last time I saw this. We ordered different things for her but she only uses Medihoney and silver alginate. As far as I know she is on suppressive trimethoprim sulfamethoxazole. She does not admit to any fever or chills. Her CBGs apparently are at baseline however she is saying that she feels some discomfort on the lateral part of her ankle I looked over her last inflammatory markers from the summer 2020 at which time she had a deeply necrotic infected wound in this area. On 11/10/2018 her sedimentation rate was 56 and C-reactive protein 9.9. This was 107 and 29 on 07/29/2018. 6/17; the patient had a necrotic wound the last time she was here on the right dorsal foot. After debridement I did a culture. This showed a very resistant ESBL Klebsiella as well as Enterococcus. Her x-ray of the foot which was done because of warmth and some discomfort showed bone destruction within the carpal bones involving the  navicular acute cuboid lateral middle cuneiforms but essentially unchanged from her prior study which was done on 10/29/2018. The findings were felt to represent chronic osteomyelitis. We did inflammatory markers on her. Her white count was 5.25 sedimentation rate 16 and C-reactive protein at 11.1. Notable for the fact that in August 2020 her CRP was 9.9 and sedimentation rate 56. I have looked at her x-rays. It is true that the bone destruction is very impressive however the patient came into this clinic for the wound on her right foot with pieces of bone literally falling out anteriorly with purulent material. I am not exactly sure I could have expected anything different. She has not been systemically unwell no fever chills or blood sugars have been reasonable. 6/28; she arrives with a right heel closed. The substantial area on the right anterior foot looks healthy. Much better looking surface. I think we can change to [facility] seems to help this previously. She is getting her antibiotics at dialysis she should be just about finished 7/9; changed to [facility] last week. Surface wound looks satisfactory not much change in surface area however. She is going to [place] state next week this is usually a difficult thing for this patient follow-up will be for 2 weeks. 7/23; using Hydrofera Blue. She returns from her trip and the wound looks surprisingly good. Usually when this patient goes on trips she comes back with a lot of problems with the wound. She is saying that she sometimes feels an episodic "crunching" feeling on the lateral part of the foot. She is neuropathic and not feeling pain but wonders whether this could be a neuropathic dysesthesia. 11/13/19-Patient returns after 3 weeks, the wound itself is stable and patient states that there is nothing new going on she is on some extra anxiety medications and is resisting the temptation to pick at the dry skin around the wound. 9/20;  patient has not been here in over a month and I have not seen her in 2 months. The wound in terms of size I think is about the same. There is no exposed bone. She has a nonviable surface on this. She is supposed to be using [facility] however she is also been using some form of honey preparation as well as a silver-based dressings. I do not think she has any pattern to this. 10/4; 2-week follow-up. Patient has been using some form of spray which she says has  honey and silver to purchase this online she has been covering it with gauze. In spite of this the wound actually looks quite good. The deeper divot distally appears to be close down. There is a rim of epithelialization. 10/18; 2-week follow-up. Patient has been using her Hydrofera Blue covered with her silver honey spray that she got online. 11/1; 2-week follow-up. She is using Hydrofera Blue with a silver honey spray. Wound bed is measuring smaller. She has noticed that her foot is warmer on the right. She is concerned about infection. For a long period of time I had her on prophylactic trimethoprim sulfamethoxazole DS 1 tablet daily. She is asking for this to be restarted. The patient is walking on this foot because of repairs that are being done in a home her but her room is on the second floor she has to go up and down stairs. I have cautioned against this however as usual she will do exactly what she wants to do 11/15; 2-week follow-up. She uses Hydrofera Blue with a silver/honey spray which I have never heard of. I think her wound looks about the same. Some epithelialization. No evidence that this is infected. I think she is walking on this more than we agreed on. She is going on extensive vacation over Thanksgiving 12/3; 2-week follow-up. She is using Hydrofera Blue however over Thanksgiving she ran out of this and she is simply been using Medihoney. In spite of this her wound is smaller almost divided into 2 now. She traveled  extensively over Thanksgiving and actually looks quite good in spite of this. Usually this is been a marker of problems for her 12/17; 2-week follow-up. She is using Hydrofera Blue. The wound is smaller. Debris on the surface of this is fibrinous. She is traveling to [place] over the holidays which never bodes well for her wounds. I think she is walking more on her feet then she is even willing to admit and she tells me she does walk 2/11; using Hydrofera Blue. Her wounds are contracting however she walks in the clinic with a history that she has not been able to eat she has had vomiting. She also has right eye problems. Her blood sugar was 567. She had blood work done at dialysis and we called there to get her blood work although it still had not returned although they should have this by the end of the day we were told. She also stated that she was not sure what her blood sugar was as her glucose monitor was not working and not coming to tomorrow. She also for some reason does not think her insulin pump is working well. Her endocrinologist is Dr. Elayne Snare 06/03/2020 upon evaluation today patient actually appears to be doing excellent in regard to her wound. In fact she tells me it was not even bleeding until she picked a dry piece of skin off while we were getting ready to come in and see her today. Fortunately there is no signs of active infection which is great news and overall very pleased. She is going to be seeing her neurologist sometime shortly I believe it might even be on Monday. Nonetheless there can proceed with the work-up as far as anything else going on with her eye the fact that her right eyelid is drooping. 3/25; right medial dorsal foot. She has superficial areas here that appear to be fully epithelialized she is using Medihoney and silver alginate and some combination. ooShe has a new area on  the mid left foot at roughly the level of the fifth metatarsal  base Objective Constitutional Patient is hypertensive.. Pulse regular and within target range for patient.Marland Kitchen Respirations regular, non-labored and within target range.. Temperature is normal and within the target range for the patient.Marland Kitchen Appears in no distress. Vitals Time Taken: 1:03 PM, Height: 67 in, Weight: 125 lbs, BMI: 19.6, Temperature: 98.5 F, Pulse: 91 bpm, Respiratory Rate: 17 breaths/min, Blood Pressure: 193/97 mmHg, Capillary Blood Glucose: 196 mg/dl. Musculoskeletal There is no obvious deformity. No erythema.. General Notes: Wound exam ooRight dorsal medial foot. I think most of these are epithelialized no debridement ooShe had a new area at least new to me on the left midfoot. This is on the base of a subluxed area. Raised callus and skin around the edge I removed with a #3 curette debris from the surface. Hemostasis with direct pressure. Integumentary (Hair, Skin) Wound #43 status is Open. Original cause of wound was Gradually Appeared. The date acquired was: 08/04/2018. The wound has been in treatment 94 weeks. The wound is located on the Right,Medial Foot. The wound measures 0.5cm length x 0.7cm width x 0.1cm depth; 0.275cm^2 area and 0.027cm^3 volume. There is Fat Layer (Subcutaneous Tissue) exposed. There is no tunneling or undermining noted. There is a medium amount of serosanguineous drainage noted. The wound margin is flat and intact. There is large (67-100%) pink granulation within the wound bed. There is no necrotic tissue within the wound bed. Wound #50 status is Open. Original cause of wound was Skin T ear/Laceration. The date acquired was: 06/24/2020. The wound is located on the [place]. The wound measures 0.5cm length x 0.5cm width x 0.2cm depth; 0.196cm^2 area and 0.039cm^3 volume. There is Fat Layer (Subcutaneous Tissue) exposed. There is no tunneling or undermining noted. There is a medium amount of serosanguineous drainage noted. The wound margin is well  defined and not attached to the wound base. There is medium (34-66%) red granulation within the wound bed. There is a small (1-33%) amount of necrotic tissue within the wound bed. 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 There was a Excisional Skin/Subcutaneous Tissue Debridement with a total area of 0.35 sq cm performed by Ricard Dillon., MD. With the following instrument(s): Curette to remove Non-Viable tissue/material. Material removed includes Subcutaneous Tissue and Slough and after achieving pain control using Other (benzocaine). No specimens were taken. A time out was conducted at 13:14, prior to the start of the procedure. A Minimum amount of bleeding was controlled with Pressure. The procedure was tolerated well. Post Debridement Measurements: 0.5cm length x 0.7cm width x 0.1cm depth; 0.027cm^3 volume. Character of Wound/Ulcer Post Debridement is stable. Severity of Tissue Post Debridement is: Fat layer exposed. Post procedure Diagnosis Wound #43: Same as Pre-Procedure Wound #50 Pre-procedure diagnosis of Wound #50 is a Diabetic Wound/Ulcer of the Lower Extremity located on the Left,Plantar Foot .Severity of Tissue Pre Debridement is: Fat layer exposed. There was a Excisional Skin/Subcutaneous Tissue Debridement with a total area of 0.25 sq cm performed by Ricard Dillon., MD. With the following instrument(s): Curette to remove Non-Viable tissue/material. Material removed includes Subcutaneous Tissue and  Slough and after achieving pain control using Other (benzocaine). No specimens were taken.  A time out was conducted at 13:14, prior to the start of the procedure. A Minimum amount of bleeding was controlled with Pressure. The procedure was tolerated well. Post Debridement Measurements: 0.5cm length x 0.5cm width x 0.2cm depth; 0.039cm^3 volume. Character of Wound/Ulcer Post Debridement is stable. Severity of Tissue Post Debridement is: Fat layer exposed. Post procedure Diagnosis Wound #50: Same as Pre-Procedure Plan Follow-up Appointments: Return Appointment in 2 weeks. Bathing/ Shower/ Hygiene: May shower and wash wound with soap and water. Edema Control - Lymphedema / SCD / Other: Elevate legs to the level of the heart or above for 30 minutes daily and/or when sitting, a frequency of: - throughout the day Avoid standing for long periods of time. Moisturize legs daily. - with dressing changes Off-Loading: Other: - minimal weight bearing right foot Home Health: New wound care orders this week; continue Home Health for wound care. May utilize formulary equivalent dressing for wound treatment orders unless otherwise specified. - New wound, Left Plantar Foot Other Home Health Orders/Instructions: - Interim WOUND #43: - Foot Wound Laterality: Right, Medial Cleanser: Wound Cleanser (Home Health) Every Other Day/30 Days Discharge Instructions: Cleanse the wound with wound cleanser or normal saline prior to applying a clean dressing using gauze sponges, not tissue or cotton balls. Prim Dressing: KerraCel Ag Gelling Fiber Dressing, 2x2 in (silver alginate) (Home Health) Every Other Day/30 Days ary Discharge Instructions: Apply silver alginate to wound bed in clinic. pt may use silver honey spray at home Secondary Dressing: Woven Gauze Sponge, Non-Sterile 4x4 in [facility]) Every Other Day/30 Days Discharge Instructions: Apply over primary dressing as directed. Secured With: Elastic Bandage 4 inch (ACE bandage) (Home Health) Every Other Day/30 Days Discharge Instructions: Secure  with ACE bandage as directed. Secured With: [employer], 4.5x3.1 (in/yd) [facility]) Every Other Day/30 Days Discharge Instructions: Secure with Kerlix as directed. Secured With: Paper T ape, 2x10 (in/yd) (Home Health) Every Other Day/30 Days Discharge Instructions: Secure dressing with tape as directed. WOUND #50: - Foot Wound Laterality: Plantar, Left Cleanser: Wound Cleanser (Home Health) Every Other Day/30 Days Discharge Instructions: Cleanse the wound with wound cleanser or normal saline prior to applying a clean dressing using gauze sponges, not tissue or cotton balls. Prim Dressing: KerraCel Ag Gelling Fiber Dressing, 2x2 in (silver alginate) (Home Health) Every Other Day/30 Days ary Discharge Instructions: Apply silver alginate to wound bed in clinic. pt may use silver honey spray at home Secondary Dressing: Woven Gauze Sponge, Non-Sterile 4x4 in [facility]) Every Other Day/30 Days Discharge Instructions: Apply over primary dressing as directed. Secured With: Elastic Bandage 4 inch (ACE bandage) (Home Health) Every Other Day/30 Days Discharge Instructions: Secure with ACE bandage as directed. Secured With: [employer], 4.5x3.1 (in/yd) [facility]) Every Other Day/30 Days Discharge Instructions: Secure with Kerlix as directed. Secured With: Paper T ape, 2x10 (in/yd) (Home Health) Every Other Day/30 Days Discharge Instructions: Secure dressing with tape as directed. 1. New area on the left dorsal foot. This is close to the inferior part of a previous wound. I debrided this I have asked to be use silver alginate but as usual I think she will use some combination of silver alginate and Medihoney. 2. On the dorsal foot she has 3 areas that do not look completely epithelialized but under illumination they seem to be almost closed. 3. She asked me about x-raying her left foot. Truthfully I am scared  to do that because the x-ray is so abnormal is to not be able to detect  anything other than gross changes. She states when she walks across the room her feet make noises although that certainly possible. 4. Underlying chronic osteomyelitis 5. The patient refused bilateral amputations in the past. She has her feet still but they are largely cosmetic she can only stand to walk short distances here i.e. in her home Electronic Signature(s) Signed: 06/27/2020 9:50:14 AM By: Linton Ham MD Entered By: Linton Ham on 06/24/2020 13:35:16 -------------------------------------------------------------------------------- SuperBill Details Patient Name: Date of Service: Nancy LLO [name] 06/24/2020 Medical Record Number: [id number] Patient Account Number: [id number] Date of Birth/Sex: Treating RN: 1970/09/28 (50 y.o. 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, right eye Facility Procedures CPT4 Code: [id number] Description: [place] - DEB SUBQ TISSUE 20 SQ CM/< ICD-10 Diagnosis Description L97.514 Non-pressure chronic ulcer of other part of right foot with necrosis of bone Modifier: Quantity: 1 Physician Procedures : CPT4 Code Description Modifier [id number] - WC PHYS SUBQ TISS 20 SQ CM ICD-10 Diagnosis Description L97.514 Non-pressure chronic ulcer of other part of right foot with necrosis of bone Quantity: 1 Electronic Signature(s) Signed: 07/08/2020 4:45:31 PM By: Linton Ham MD Previous Signature: 06/27/2020 9:50:14 AM Version By: Linton Ham  MD Entered By: Linton Ham on 07/08/2020 13:19:30

## 2020-06-29 NOTE — Progress Notes (Addendum)
[name] ([id number]) Visit Report for 06/24/2020 Arrival Information Details Patient Name: Date of Service: Nancy LLO Nancy Pao [name] 06/24/2020 12:30 PM Medical Record Number: [id number] Patient Account Number: [id number] Date of Birth/Sex: Treating RN: 03-31-1971 (50 y.o. [name] Primary Care Rickie Gutierres: [name], [name] Other Clinician: Referring Rey Dansby: Treating Mckade Gurka/Extender: [name], [name] Weeks in Treatment: 86 Visit Information History Since Last Visit Added or deleted any medications: No Patient Arrived: Wheel Chair Any new allergies or adverse reactions: No Arrival Time: 13:03 Had a fall or experienced change in No Accompanied By: self activities of daily living that may affect Transfer Assistance: None risk of falls: Patient Identification Verified: Yes Signs or symptoms of abuse/neglect since last visito No Secondary Verification Process Completed: Yes Hospitalized since last visit: No Patient Requires Transmission-Based Precautions: No Implantable device outside of the clinic excluding No Patient Has Alerts: No cellular tissue based products placed in the center since last visit: Has Dressing in Place as Prescribed: Yes Pain Present Now: No Electronic Signature(s) Signed: 06/29/2020 9:05:34 AM By: Sandre Kitty Entered By: Sandre Kitty on 06/24/2020 13:03:28 -------------------------------------------------------------------------------- Encounter Discharge Information Details Patient Name: Date of Service: Nancy LLO [address] Nancy Port [name] 06/24/2020 12:30 PM Medical Record Number: [id number] Patient Account Number: [id number] Date of Birth/Sex: Treating RN: 1971/01/18 (50 y.o. [name] Primary Care Buster Schueller: [name], [name] Other Clinician: Referring Chena Chohan: Treating Reggie Welge/Extender: [name], [name] Weeks in Treatment: 26 Encounter Discharge Information Items Post Procedure Vitals Discharge  Condition: Stable Temperature (F): 98.5 Ambulatory Status: Wheelchair Pulse (bpm): 91 Discharge Destination: Home Respiratory Rate (breaths/min): 17 Transportation: Private Auto Blood Pressure (mmHg): 193/97 Accompanied By: self Schedule Follow-up Appointment: Yes Clinical Summary of Care: Electronic Signature(s) Signed: 06/24/2020 6:38:23 PM By: Deon Pilling Entered By: Deon Pilling on 06/24/2020 13:45:17 -------------------------------------------------------------------------------- Lower Extremity Assessment Details Patient Name: Date of Service: Nancy [name] 06/24/2020 12:30 PM Medical Record Number: [id number] Patient Account Number: [id number] Date of Birth/Sex: Treating RN: [birth year] (50 y.o. [name] Primary Care Paelyn Smick: [name], [name] Other Clinician: Referring Vernell Townley: Treating Cletis Clack/Extender: [name], [name] Weeks in Treatment: 94 Edema Assessment Assessed: [Left: Yes] [Right: Yes] Edema: [Left: No] [Right: No] Calf Left: Right: Point of Measurement: From Medial Instep 27 cm Ankle Left: Right: Point of Measurement: From Medial Instep 19 cm Vascular Assessment Pulses: Dorsalis Pedis Palpable: [Left:Yes] [Right:Yes] Electronic Signature(s) Signed: 06/24/2020 6:16:52 PM By: Lorrin Jackson Entered By: Lorrin Jackson on 06/24/2020 13:18:21 -------------------------------------------------------------------------------- Multi Wound Chart Details Patient Name: Date of Service: Nancy LLO Nancy Pao [name] 06/24/2020 12:30 PM Medical Record Number: [id number] Patient Account Number: [id number] Date of Birth/Sex: Treating RN: 1971-02-03 (50 y.o. [name] Primary Care Alette Kataoka: [name], [name] Other Clinician: Referring Sakari Raisanen: Treating Chirag Krueger/Extender: [name], [name] Weeks in Treatment: 32 Vital Signs Height(in): 67 Capillary Blood Glucose(mg/dl): 196 Weight(lbs): 125 Pulse(bpm):  13 Body Mass Index(BMI): 20 Blood Pressure(mmHg): 193/97 Temperature(F): 98.5 Respiratory Rate(breaths/min): 17 Photos: [43:Right, Medial Foot] [50:Left, Plantar Foot] [N/A:N/A N/A] Wound Location: [43:Gradually Appeared] [50:Skin T ear/Laceration] [N/A:N/A] Wounding Event: [43:Diabetic Wound/Ulcer of the Lower] [50:Diabetic Wound/Ulcer of the Lower] [N/A:N/A] Primary Etiology: [43:Extremity Cataracts, Chronic sinus] [50:Extremity Cataracts, Chronic sinus] [N/A:N/A] Comorbid History: [43:problems/congestion, Anemia, Sleep problems/congestion, Anemia, Sleep Apnea, Deep Vein Thrombosis, Hypertension, Peripheral Arterial Disease, Type I Diabetes, Osteoarthritis, Osteomyelitis, Neuropathy, Seizure Disorder 08/04/2018]  [50:Apnea, Deep Vein Thrombosis, Hypertension, Peripheral Arterial  Disease, Type I Diabetes, Osteoarthritis, Osteomyelitis, Neuropathy, Seizure Disorder 06/24/2020] [N/A:N/A] Date Acquired: [43:94] [50:0] [N/A:N/A] Weeks of Treatment: [43:Open] [50:Open] [N/A:N/A] Wound Status: [43:Yes] [50:No] [N/A:N/A] Clustered Wound: [43:0.5x0.7x0.1] [50:0.5x0.5x0.2] [N/A:N/A] Measurements L x W x D (cm) [43:0.275] [50:0.196] [N/A:N/A] A (cm) : rea [43:0.027] [50:0.039] [N/A:N/A] Volume (cm) : [43:99.60%] [50:0.00%] [N/A:N/A] % Reduction in A [43:rea: 99.90%] [50:0.00%] [N/A:N/A] % Reduction in Volume: [43:Grade 4] [50:Grade 1] [N/A:N/A] Classification: [43:Medium] [50:Medium] [N/A:N/A] Exudate A mount: [43:Serosanguineous] [50:Serosanguineous] [N/A:N/A] Exudate Type: [43:red, brown] [50:red, brown] [N/A:N/A] Exudate Color: [43:Flat and Intact] [50:Well defined, not attached] [N/A:N/A] Wound Margin: [43:Large (67-100%)] [50:Medium (34-66%)] [N/A:N/A] Granulation A mount: [43:Pink] [50:Red] [N/A:N/A] Granulation Quality: [43:None Present (0%)] [50:Small (1-33%)] [N/A:N/A] Necrotic A mount: [43:Fat Layer (Subcutaneous Tissue): Yes Fat Layer (Subcutaneous Tissue): Yes N/A] Exposed  Structures: [43:Fascia: No Tendon: No Muscle: No Joint: No Bone: No Large (67-100%)] [50:Fascia: No Tendon: No Muscle: No Joint: No Bone: No None] [N/A:N/A] Epithelialization: [43:Debridement - Excisional] [50:Debridement - Excisional] [N/A:N/A] Debridement: Pre-procedure Verification/Time Out 13:14 [50:13:14] [N/A:N/A] Taken: [43:Other] [50:Other] [N/A:N/A] Pain Control: [43:Subcutaneous, Slough] [50:Subcutaneous, Slough] [N/A:N/A] Tissue Debrided: [43:Skin/Subcutaneous Tissue] [50:Skin/Subcutaneous Tissue] [N/A:N/A] Level: [43:0.35] [50:0.25] [N/A:N/A] Debridement A (sq cm): [43:rea Curette] [50:Curette] [N/A:N/A] Instrument: [43:Minimum] [50:Minimum] [N/A:N/A] Bleeding: [43:Pressure] [50:Pressure] [N/A:N/A] Hemostasis A chieved: [43:Procedure was tolerated well] [50:Procedure was tolerated well] [N/A:N/A] Debridement Treatment Response: [43:0.5x0.7x0.1] [50:0.5x0.5x0.2] [N/A:N/A] Post Debridement Measurements L x W x D (cm) [43:0.027] [50:0.039] [N/A:N/A] Post Debridement Volume: (cm) [43:Debridement] [50:Debridement] [N/A:N/A] Treatment Notes Wound #43 (Foot) Wound Laterality: Right, Medial Cleanser Wound Cleanser Discharge Instruction: Cleanse the wound with wound cleanser or normal saline prior to applying a clean dressing using gauze sponges, not tissue or cotton balls. Peri-Wound Care Topical Primary Dressing KerraCel Ag Gelling Fiber Dressing, 2x2 in (silver alginate) Discharge Instruction: Apply silver alginate to wound bed in clinic. pt may use silver honey spray at home Secondary Dressing Woven Gauze Sponge, Non-Sterile 4x4 in Discharge Instruction: Apply over primary dressing as directed. Secured With Elastic Bandage 4 inch (ACE bandage) Discharge Instruction: Secure with ACE bandage as directed. Kerlix Roll Sterile, 4.5x3.1 (in/yd) Discharge Instruction: Secure with Kerlix as directed. Paper Tape, 2x10 (in/yd) Discharge Instruction: Secure dressing with tape as  directed. Compression Wrap Compression Stockings Add-Ons Wound #50 (Foot) Wound Laterality: Plantar, Left Cleanser Wound Cleanser Discharge Instruction: Cleanse the wound with wound cleanser or normal saline prior to applying a clean dressing using gauze sponges, not tissue or cotton balls. Peri-Wound Care Topical Primary Dressing KerraCel Ag Gelling Fiber Dressing, 2x2 in (silver alginate) Discharge Instruction: Apply silver alginate to wound bed in clinic. pt may use silver honey spray at home Secondary Dressing Woven Gauze Sponge, Non-Sterile 4x4 in Discharge Instruction: Apply over primary dressing as directed. Secured With Elastic Bandage 4 inch (ACE bandage) Discharge Instruction: Secure with ACE bandage as directed. Kerlix Roll Sterile, 4.5x3.1 (in/yd) Discharge Instruction: Secure with Kerlix as directed. Paper Tape, 2x10 (in/yd) Discharge Instruction: Secure dressing with tape as directed. Compression Wrap Compression Stockings Add-Ons Electronic Signature(s) Signed: 07/08/2020 4:45:31 PM By: Linton Ham MD Signed: 07/08/2020 5:03:53 PM By: Lorrin Jackson Previous Signature: 06/24/2020 6:16:52 PM Version By: Lorrin Jackson Previous Signature: 06/27/2020 9:50:14 AM Version By: Linton Ham MD Entered By: Linton Ham on 07/08/2020 13:14:51 -------------------------------------------------------------------------------- Multi-Disciplinary Care Plan Details Patient Name: Date of Service: Nancy LLO [address] [place] [name] 06/24/2020 12:30 PM Medical Record Number: [id number] Patient Account Number: [id number] Date of Birth/Sex: Treating RN: 1971-01-23 (49 y.o. [name] Primary Care Noelia Lenart: MO REIRA, [name] Other  Clinician: Referring Aaban Griep: Treating Divine Hansley/Extender: [name], [name] Weeks in Treatment: 96 Multidisciplinary Care Plan reviewed with physician Active Inactive Nutrition Nursing Diagnoses: Imbalanced nutrition Impaired  glucose control: actual or potential Potential for alteratiion in Nutrition/Potential for imbalanced nutrition Goals: Patient/caregiver agrees to and verbalizes understanding of need to use nutritional supplements and/or vitamins as prescribed Date Initiated: 09/03/2018 Date Inactivated: 10/07/2018 Target Resolution Date: 10/01/2018 Goal Status: Met Patient/caregiver will maintain therapeutic glucose control Date Initiated: 09/03/2018 Target Resolution Date: 07/15/2020 Goal Status: Active Interventions: Provide education on elevated blood sugars and impact on wound healing Treatment Activities: Patient referred to Primary Care Physician for further nutritional evaluation : 09/03/2018 Notes: Wound/Skin Impairment Nursing Diagnoses: Impaired tissue integrity Knowledge deficit related to ulceration/compromised skin integrity Goals: Patient/caregiver will verbalize understanding of skin care regimen Date Initiated: 09/03/2018 Target Resolution Date: 07/15/2020 Goal Status: Active Ulcer/skin breakdown will have a volume reduction of 30% by week 4 Date Initiated: 09/03/2018 Date Inactivated: 10/07/2018 Target Resolution Date: 10/01/2018 Goal Status: Unmet Unmet Reason: COMORBITIES Ulcer/skin breakdown will have a volume reduction of 50% by week 8 Date Initiated: 10/07/2018 Date Inactivated: 11/03/2018 Target Resolution Date: 10/31/2018 Goal Status: Unmet Unmet Reason: Osteomyelitis Interventions: Assess patient/caregiver ability to obtain necessary supplies Assess patient/caregiver ability to perform ulcer/skin care regimen upon admission and as needed Assess ulceration(s) every visit Provide education on ulcer and skin care Treatment Activities: Skin care regimen initiated : 09/03/2018 Topical wound management initiated : 09/03/2018 Notes: Electronic Signature(s) Signed: 06/24/2020 6:15:53 PM By: Lorrin Jackson Entered By: Lorrin Jackson on 06/24/2020  18:15:52 -------------------------------------------------------------------------------- Pain Assessment Details Patient Name: Date of Service: Lorelee New [name] 06/24/2020 12:30 PM Medical Record Number: [id number] Patient Account Number: [id number] Date of Birth/Sex: Treating RN: 04/23/1970 (50 y.o. [name] Primary Care Askari Kinley: [name], [name] Other Clinician: Referring Cherrell Maybee: Treating Afshin Chrystal/Extender: [name], [name] Weeks in Treatment: 53 Active Problems Location of Pain Severity and Description of Pain Patient Has Paino No Site Locations Pain Management and Medication Current Pain Management: Electronic Signature(s) Signed: 06/24/2020 6:16:52 PM By: Lorrin Jackson Signed: 06/29/2020 9:05:34 AM By: Sandre Kitty Entered By: Sandre Kitty on 06/24/2020 13:04:09 -------------------------------------------------------------------------------- Patient/Caregiver Education Details Patient Name: Date of Service: Nancy LLO Nancy Pao [name] 3/25/2022andnbsp12:30 PM Medical Record Number: [id number] Patient Account Number: [id number] Date of Birth/Gender: Treating RN: [birth year] (50 y.o. [name] Primary Care Physician: [name], [name] Other Clinician: Referring Physician: Treating Physician/Extender: [name], [name] Weeks in Treatment: 48 Education Assessment Education Provided To: Patient Education Topics Provided Offloading: Methods: Explain/Verbal, Printed Responses: State content correctly Wound/Skin Impairment: Methods: Demonstration, Explain/Verbal, Printed Responses: State content correctly Electronic Signature(s) Signed: 06/24/2020 6:16:52 PM By: Lorrin Jackson Entered By: Lorrin Jackson on 06/24/2020 13:27:06 -------------------------------------------------------------------------------- Wound Assessment Details Patient Name: Date of Service: Lorelee New [name] 06/24/2020 12:30  PM Medical Record Number: [id number] Patient Account Number: [id number] Date of Birth/Sex: Treating RN: 11-09-1970 (50 y.o. [name] Primary Care Celena Lanius: [name], [name] Other Clinician: Referring Lavonne Cass: Treating Foy Mungia/Extender: [name], [name] Weeks in Treatment: 94 Wound Status Wound Number: 43 Primary Diabetic Wound/Ulcer of the Lower Extremity Etiology: Wound Location: Right, Medial Foot Wound Open Wounding Event: Gradually Appeared Status: Date Acquired: 08/04/2018 Comorbid Cataracts, Chronic sinus problems/congestion, Anemia, Sleep Weeks Of Treatment: 94 History: Apnea, Deep Vein Thrombosis, Hypertension, Peripheral Arterial Clustered Wound: Yes Disease, Type I Diabetes, Osteoarthritis, Osteomyelitis, Neuropathy, Seizure Disorder  Photos Wound Measurements Length: (cm) 0.5 Width: (cm) 0.7 Depth: (cm) 0.1 Area: (cm) 0.275 Volume: (cm) 0.027 % Reduction in Area: 99.6% % Reduction in Volume: 99.9% Epithelialization: Large (67-100%) Tunneling: No Undermining: No Wound Description Classification: Grade 4 Wound Margin: Flat and Intact Exudate Amount: Medium Exudate Type: Serosanguineous Exudate Color: red, brown Foul Odor After Cleansing: No Slough/Fibrino No Wound Bed Granulation Amount: Large (67-100%) Exposed Structure Granulation Quality: Pink Fascia Exposed: No Necrotic Amount: None Present (0%) Fat Layer (Subcutaneous Tissue) Exposed: Yes Tendon Exposed: No Muscle Exposed: No Joint Exposed: No Bone Exposed: No Electronic Signature(s) Signed: 06/24/2020 6:16:52 PM By: Lorrin Jackson Signed: 06/29/2020 9:05:34 AM By: Sandre Kitty Entered By: Sandre Kitty on 06/24/2020 17:05:49 -------------------------------------------------------------------------------- Wound Assessment Details Patient Name: Date of Service: Nancy LLO Nancy Pao [name] 06/24/2020 12:30 PM Medical Record Number: [id number] Patient Account Number:  [id number] Date of Birth/Sex: Treating RN: [birth year] (50 y.o. [name] Primary Care Carianna Lague: [name], [name] Other Clinician: Referring Trevaun Rendleman: Treating Dameer Speiser/Extender: [name], [name] Weeks in Treatment: 94 Wound Status Wound Number: 50 Primary Diabetic Wound/Ulcer of the Lower Extremity Etiology: Wound Location: Left, Plantar Foot Wound Open Wounding Event: Skin Tear/Laceration Status: Date Acquired: 06/24/2020 Comorbid Cataracts, Chronic sinus problems/congestion, Anemia, Sleep Weeks Of Treatment: 0 History: Apnea, Deep Vein Thrombosis, Hypertension, Peripheral Arterial Clustered Wound: No Disease, Type I Diabetes, Osteoarthritis, Osteomyelitis, Neuropathy, Seizure Disorder Photos Wound Measurements Length: (cm) 0.5 Width: (cm) 0.5 Depth: (cm) 0.2 Area: (cm) 0.196 Volume: (cm) 0.039 % Reduction in Area: 0% % Reduction in Volume: 0% Epithelialization: None Tunneling: No Undermining: No Wound Description Classification: Grade 1 Wound Margin: Well defined, not attached Exudate Amount: Medium Exudate Type: Serosanguineous Exudate Color: red, brown Foul Odor After Cleansing: No Slough/Fibrino Yes Wound Bed Granulation Amount: Medium (34-66%) Exposed Structure Granulation Quality: Red Fascia Exposed: No Necrotic Amount: Small (1-33%) Fat Layer (Subcutaneous Tissue) Exposed: Yes Tendon Exposed: No Muscle Exposed: No Joint Exposed: No Bone Exposed: No Electronic Signature(s) Signed: 06/24/2020 6:16:52 PM By: Lorrin Jackson Signed: 06/29/2020 9:05:34 AM By: Sandre Kitty Entered By: Sandre Kitty on 06/24/2020 17:06:13 -------------------------------------------------------------------------------- Vitals Details Patient Name: Date of Service: Nancy LLO [place] Nancy [place] [name] 06/24/2020 12:30 PM Medical Record Number: [id number] Patient Account Number: [id number] Date of Birth/Sex: Treating RN: 1971-01-09 (50 y.o. [name] Primary Care Averyanna Sax: [name], [name] Other Clinician: Referring Shekelia Boutin: Treating Ruthie Berch/Extender: [name], [name] Weeks in Treatment: 46 Vital Signs Time Taken: 13:03 Temperature (F): 98.5 Height (in): 67 Pulse (bpm): 91 Weight (lbs): 125 Respiratory Rate (breaths/min): 17 Body Mass Index (BMI): 19.6 Blood Pressure (mmHg): 193/97 Capillary Blood Glucose (mg/dl): 196 Reference Range: 80 - 120 mg / dl Electronic Signature(s) Signed: 06/29/2020 9:05:34 AM By: Sandre Kitty Entered By: Sandre Kitty on 06/24/2020 13:04:03

## 2020-07-04 ENCOUNTER — Other Ambulatory Visit: Payer: Self-pay

## 2020-07-04 ENCOUNTER — Ambulatory Visit (INDEPENDENT_AMBULATORY_CARE_PROVIDER_SITE_OTHER): Payer: Medicare HMO

## 2020-07-04 ENCOUNTER — Ambulatory Visit: Payer: Medicare HMO | Admitting: Podiatry

## 2020-07-04 DIAGNOSIS — L97522 Non-pressure chronic ulcer of other part of left foot with fat layer exposed: Secondary | ICD-10-CM

## 2020-07-04 DIAGNOSIS — Z89431 Acquired absence of right foot: Secondary | ICD-10-CM

## 2020-07-04 DIAGNOSIS — E0843 Diabetes mellitus due to underlying condition with diabetic autonomic (poly)neuropathy: Secondary | ICD-10-CM

## 2020-07-04 DIAGNOSIS — L97514 Non-pressure chronic ulcer of other part of right foot with necrosis of bone: Secondary | ICD-10-CM

## 2020-07-04 NOTE — Progress Notes (Signed)
HPI: 50 y.o. female presenting today after not being seen in the office for nearly 2 years for evaluation of an ulcer that is developed to the plantar aspect of the left foot.  Patient has been seen and treated routinely for the past 2 years at [facility].  Patient was originally scheduled for BKA RLE however she refused the amputation and currently her right foot appears to be doing well.  She states that she has been applying the Medihoney under the care of the wound care center.  Patient has a new ulcer that is developed to the plantar aspect of the left foot.  Patient states that she identified it a few weeks ago and when she applied pressure there was some purulence and drainage that came from the wound.  She would like to have it evaluated.  Currently she has not done anything for treatment  Past Medical History:  Diagnosis Date  . Anemia   . Arthritis   . Bronchitis   . C. difficile diarrhea 09/26/2014  . Cataracts, both eyes    had surgery to remove  . Cellulitis of right foot 06/02/2014  . CKD (chronic kidney disease) stage 3, GFR 30-59 ml/min (HCC)    Dialysis T, TH, Sat  . Diabetic ulcer of right foot ([place]) 06/02/2014  . DVT (deep venous thrombosis) ([place]) 10/2014   one in each one in leg- PICC line , one in right and left arm  . H/O seasonal allergies   . History of blood transfusion   . Hypercholesteremia   . Hypertension   . Left foot infection   . Neuromuscular disorder ([place])    lower legs and feet  . Neuropathy in diabetes ([place])   . Peripheral vascular disease ([place])   . Sleep apnea    does not use cpap  . Staphylococcus aureus bacteremia 10/2014  . Type 1 diabetes ([place])    onset age 53     Physical Exam: General: The patient is alert and oriented x3 in no acute distress.  Dermatology: Ulcer noted to the plantar aspect of the left foot measuring approximately 0.7 x 0.7 x 0.2 cm.  Periwound is callused.  To the noted ulceration there is no eschar.  There is a moderate  amount of slough fibrin and necrotic tissue noted.  Granulation tissue and wound base is red.  There is no exposed bone muscle tendon ligament or joint.  Vascular: Palpable pedal pulses bilaterally. No edema or erythema noted.   Neurological: Epicritic and protective threshold absent bilaterally.   Musculoskeletal Exam: H/o midfoot amputation RLE.  Radiographic Exam:  Diffuse demineralization of the osseous structures of the foot consistent with osteoporosis.  Fifth ray absent from the left foot at the level of the tarsometatarsal joint.  Prior transmetatarsal amputation right foot.  Degenerative changes noted.  Bilateral x-rays there does not appear to be any acute osteolytic portions of bone that would be concerning for an acute osteomyelitis.  Assessment: 1.  Ulcer left foot secondary to diabetes mellitus 2.  Partial foot amputation right 3.  H/o osteomyelitis right foot 4.  T1DM-poorly controlled 5.  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 Patient evaluated. X-Rays reviewed.  2.  Medically necessary excisional debridement including subcutaneous tissue was performed using a tissue nipper and 312 scalpel to the left plantar midfoot ulceration.  Excisional debridement of all the necrotic nonviable tissue down to healthy bleeding viable tissue was performed with post debridement measurement same as pre-  3.  Medihoney applied to all wounds bilateral with light dressings 4.  Continue management at [facility] wound care center 5.  Return to clinic annually      Edrick Kins, DPM [facility]  Dr. Edrick Kins, DPM    [address], [place] [address]                Office [phone]  Fax [phone]

## 2020-07-08 ENCOUNTER — Encounter (HOSPITAL_BASED_OUTPATIENT_CLINIC_OR_DEPARTMENT_OTHER): Payer: Medicare HMO | Attending: Internal Medicine | Admitting: Internal Medicine

## 2020-07-08 ENCOUNTER — Other Ambulatory Visit: Payer: Self-pay

## 2020-07-08 DIAGNOSIS — Z992 Dependence on renal dialysis: Secondary | ICD-10-CM | POA: Insufficient documentation

## 2020-07-08 DIAGNOSIS — E10621 Type 1 diabetes mellitus with foot ulcer: Secondary | ICD-10-CM | POA: Diagnosis not present

## 2020-07-08 DIAGNOSIS — N186 End stage renal disease: Secondary | ICD-10-CM | POA: Insufficient documentation

## 2020-07-08 DIAGNOSIS — L97411 Non-pressure chronic ulcer of right heel and midfoot limited to breakdown of skin: Secondary | ICD-10-CM | POA: Insufficient documentation

## 2020-07-08 DIAGNOSIS — G40909 Epilepsy, unspecified, not intractable, without status epilepticus: Secondary | ICD-10-CM | POA: Insufficient documentation

## 2020-07-08 DIAGNOSIS — L97521 Non-pressure chronic ulcer of other part of left foot limited to breakdown of skin: Secondary | ICD-10-CM | POA: Diagnosis not present

## 2020-07-08 DIAGNOSIS — I120 Hypertensive chronic kidney disease with stage 5 chronic kidney disease or end stage renal disease: Secondary | ICD-10-CM | POA: Insufficient documentation

## 2020-07-08 DIAGNOSIS — Z86718 Personal history of other venous thrombosis and embolism: Secondary | ICD-10-CM | POA: Insufficient documentation

## 2020-07-08 DIAGNOSIS — M8580 Other specified disorders of bone density and structure, unspecified site: Secondary | ICD-10-CM | POA: Insufficient documentation

## 2020-07-08 DIAGNOSIS — E1042 Type 1 diabetes mellitus with diabetic polyneuropathy: Secondary | ICD-10-CM | POA: Insufficient documentation

## 2020-07-08 DIAGNOSIS — L97514 Non-pressure chronic ulcer of other part of right foot with necrosis of bone: Secondary | ICD-10-CM | POA: Insufficient documentation

## 2020-07-08 DIAGNOSIS — E10622 Type 1 diabetes mellitus with other skin ulcer: Secondary | ICD-10-CM | POA: Insufficient documentation

## 2020-07-08 DIAGNOSIS — L02415 Cutaneous abscess of right lower limb: Secondary | ICD-10-CM | POA: Insufficient documentation

## 2020-07-08 DIAGNOSIS — Z9641 Presence of insulin pump (external) (internal): Secondary | ICD-10-CM | POA: Insufficient documentation

## 2020-07-08 DIAGNOSIS — E1022 Type 1 diabetes mellitus with diabetic chronic kidney disease: Secondary | ICD-10-CM | POA: Diagnosis not present

## 2020-07-08 DIAGNOSIS — Z833 Family history of diabetes mellitus: Secondary | ICD-10-CM | POA: Insufficient documentation

## 2020-07-08 DIAGNOSIS — Z87891 Personal history of nicotine dependence: Secondary | ICD-10-CM | POA: Insufficient documentation

## 2020-07-08 DIAGNOSIS — E1051 Type 1 diabetes mellitus with diabetic peripheral angiopathy without gangrene: Secondary | ICD-10-CM | POA: Diagnosis not present

## 2020-07-08 NOTE — Progress Notes (Signed)
[name], [name] ([id number]) Visit Report for 07/08/2020 Debridement Details Patient Name: Date of Service: Nancy LLO [name] 07/08/2020 12:30 PM Medical Record Number: [id number] Patient Account Number: [id number] Date of Birth/Sex: Treating RN: [name] (50 y.o. [name] Primary Care Provider: [name], [name] Other Clinician: Referring Provider: Treating Provider/Extender: [name], [name] Weeks in Treatment: 96 Debridement Performed for Assessment: Wound #43 Right,Medial Foot Performed By: Physician Ricard Dillon., MD Debridement Type: Debridement Severity of Tissue Pre Debridement: Fat layer exposed Level of Consciousness (Pre-procedure): Awake and Alert Pre-procedure Verification/Time Out Yes - 13:06 Taken: Start Time: 13:07 T Area Debrided (L x W): otal 1 (cm) x 1.2 (cm) = 1.2 (cm) Tissue and other material debrided: Non-Viable, Skin: Dermis Level: Skin/Dermis Debridement Description: Selective/Open Wound Instrument: Curette Bleeding: Minimum Hemostasis Achieved: Pressure Response to Treatment: Procedure was tolerated well Level of Consciousness (Post- Awake and Alert procedure): Post Debridement Measurements of Total Wound Length: (cm) 1 Width: (cm) 1.2 Depth: (cm) 0.1 Volume: (cm) 0.094 Character of Wound/Ulcer Post Debridement: Stable Severity of Tissue Post Debridement: Fat layer exposed Post Procedure Diagnosis Same as Pre-procedure Electronic Signature(s) Signed: 07/08/2020 4:45:31 PM By: Linton Ham MD Signed: 07/08/2020 5:03:53 PM By: Lorrin Jackson Entered By: Linton Ham on 07/08/2020 13:22:28 -------------------------------------------------------------------------------- HPI Details Patient Name: Date of Service: Nancy LLO [name] 07/08/2020 12:30 PM Medical Record Number: [id number] Patient Account Number: [id number] Date of Birth/Sex: Treating RN: [name] (50 y.o. [name] Primary Care  Provider: [name], [name] Other Clinician: Referring Provider: Treating Provider/Extender: [name], [name] Weeks in Treatment: 38 History of Present Illness HPI Description: 50 year old diabetic who is known to have type 1 diabetes which is poorly controlled last hemoglobin A1c was 11%. She comes in with a ulcerated area on the left lateral foot which has been there for over 6 months. Was recently she has been treated by [name] of podiatry who saw her last on 05/28/2016. Review of his notes revealed that the patient had incision and drainage with placement of antibiotic beads to the left foot on 04/11/2016 for possible osteomyelitis of the cuboid bone. Over the last year she's had a history of amputation of the left fifth toe and a femoropopliteal popliteal bypass graft somewhere in April 2017. 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 His note [name] mentions that the patient has been referred to me for further wound care and possibly great candidate for hyperbaric oxygen therapy due to recurrent osteomyelitis. However we do not have any x-rays of biopsy reports confirming this. He has been on several antibiotics including Bactrim and most recently is on doxycycline for an MRSA. I understand, the patient was not a candidate for IV antibiotics as she has had previous PICC lines which resulted in blood clots in both arms. There was a x-ray report dated 04/04/2016 on [name] notes which showed evidence of fifth ray resection left foot with osteolytic changes noted to the fourth metatarsal and cuboid bone on the left. 06/13/2016 -- had a left foot x-ray which showed no acute fracture or dislocation and no definite radiographic evidence of osteomyelitis. Advanced osteopenia was seen. 06/20/2016 -- she has noticed a new wound on the right plantar foot in the region where she had a callus before. 06/27/16- the patient did have her x-ray of the right foot which  showed no findings to suggest osteomyelitis. She saw her endocrinologist, [name], yesterday.  Her A1c in January was 11. He also indicates mismanagement and noncompliance regarding her diabetes. She is currently on Bactrim for a lip infection. She is complaining of nausea, vomiting and diarrhea. She is unable to articulate the exact orders or dosing of the Bactrim; it is unclear when she will complete this. 07/04/2016 -- results from [facility] of ABIs with ankle waveforms were noted from 02/14/2016. The examination done on 06/27/2015 showed noncompressible ABIs with the right being 1.45 and the left being 1.33. The present examination showed a right ABI of 1.19 on the left of 1.33. The conclusion was that right normal ABI in the lower extremity at rest however compared to previous study which was noncompressible ABI may be falsely elevated side suggesting medial calcification. The left ABI suggested medial calcification. 08/01/2016 -- the patient had more redness and pain on her right foot and did not get to come to see as noted she see her PCP or go to the ER and decided to take some leftover metronidazole which she had at home. As usual, the patient does report she feels and is rather noncompliant. 08/08/2016 -- -- x-ray of the right foot -- FINDINGS:Transmetatarsal amputation is noted. No bony destruction is noted to suggest osteomyelitis. IMPRESSION: No evidence of osteomyelitis. Postsurgical changes are seen. MRI would be more sensitive for possible bony changes. Culture has grown Serratia Marcescens -- sensitive to Bactrim, ciprofloxacin, ceftazidime she was seen by [name] on 08/06/2016. He did not find any exposed bone, muscle, tendon, ligament or joint. There was no malodor and he did a excisional debridement in the office. ============ Old notes: 50 year old patient who is known to the wound clinic for a while had been away from the wound clinic since 09/01/2014. Over the  last several months she has been admitted to various hospitals including [facility] [place]. She was treated for a right metatarsal osteomyelitis with a transmetatarsal amputation and this was done about 2 months ago. He has a small ulcerated area on the right heel and she continues to have an ulcerated area on the left plantar aspect of the foot. The patient was recently admitted to the [facility] hospital group between 7/12 and 10/18/2014. she was given 3 weeks of IV vancomycin and was to follow-up with her surgeons at [facility] and also took oral vancomycin for C. difficile colitis. 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 MRI of the left foot done on 10/14/2014 showed no abscess or osteomyelitis. 04/27/15; this is a patient we know from previous stays in the wound care center. She is a type I diabetic I am not sure of her control currently. Since the last time I saw her she is had a right transmetatarsal amputation and has no wounds on her right foot and has no open wounds. She is been followed at the wound care center at [facility] in [place]. She comes today with the desire to undergo hyperbaric treatment locally. Apparently one of her wound care providers in [place] has suggested hyperbarics. This is in response to an MRI from 04/18/15 that showed increased marrow signal and loss of the proximal fifth metatarsal cortex evidence of osteomyelitis with likely early osteomyelitis in the cuboid bone as well. She has a large wound over the base of the fifth  metatarsal. She also has a eschar over her the tips of her toes on 1,3 and 5. She does not have peripheral pulses and apparently is going for an  angiogram tomorrow which seems reasonable. After this she is going to infectious disease at [facility]. They have been using Medihoney to the large wound on the lateral aspect of the left foot to. The patient has known Charcot deformity from diabetic neuropathy. She also has known diabetic PAD. Surprisingly I can't see that she has had any recent antibiotics, the patient states the last antibiotic she had was at the end of Nancy for 10 days. I think this was in response to culture that showed group G strep although I'm not exactly sure where the culture was from. She is also had arterial studies on 03/29/15. This showed a right ABI of 1.4 that was noncompressible. Her left ABI was 0.73. 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 It was not felt that arterial inflow was adequate for healing of a foot ulcer. Her Doppler waveforms looked monophasic ===== READMISSION 02/28/17; this is in an now 50 year old woman we've had at several different occasions in this clinic. 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 She has a remote ex-smoker. She was last seen in this clinic by Dr. Con Memos I think in May. More recently she is been followed by her podiatrist [name] an infectious disease Dr. Megan Salon. She has 2 open wounds the major one is over the right first metatarsal head she also has a wound on the left plantar foot. an MRI of the right foot on 01/01/17 showed a soft tissue ulcer along the plantar aspect of the first metatarsal base consistent with osteomyelitis of the first metatarsal stump. Dr. 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 According to the patient this is been open for slightly over a month. She has been on a combination of Cipro 500 twice a day, Zyvox 600 twice a day and Flagyl 500 3 times a day for over a month now as directed by Dr. Megan Salon.  cultures of the right foot earlier this year showed MRSA in January and Serratia in May. January also had a few viridans strep. Recent x-rays of both feet were done and [name] office and I don't have these reports. The patient has known PAD and has a history of aleft femoropopliteal bypass in April 2017. 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 Dwyane Dee. In spite of this the last hemoglobin A1c I can see is 10.1 on 01/01/2017. She is being referred by 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 She is been using Medihoney to this area and also an area on the left midfoot. She is using healing sandals bilaterally. ABIs in this clinic at the left posterior tibial was 1.1 noncompressible on the right READMISSION Non invasive vascular NOVANT 5/18 Aftercare following surgery of the circulatory system Procedure Note - Interface, External Ris In - 08/13/2016 11:05 AM EDT Procedure: Examination consists of physiologic resting arterial pressures of the brachial and ankle arteries bilaterally with continuous wave Doppler waveform analysis. Previous: Previous exam performed on 02/14/16 demonstrated ABIs of Rt = 1.19 and Lt =  1.33. Right: ABI = non-compressible PT 1.47 DP. S/P transmet amputation. , Left: ABI = 1.52, 2nd digit pressure = 87 mmHg Conclusions: Right: ABI (>1.3) may be falsely elevated, suggesting medial calcification. Left: ABI (>1.3) may be falsely elevated, suggesting medial calcification The patient is a now 50 year old type I diabetic is had multiple issues her graded to chronic diabetic foot ulcers. 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 We had her in the  clinic lastin Nancy. At that point she had wounds on her bilateral feet.she had wanted to try hyperbarics however the healogics review process denied her because she hadn't followed up with her vascular surgeon for her left femoropopliteal bypass. The bypass was done by Dr. Raul Del at [facility]. We made her a follow-up with Dr. Raul Del however she did not keep the appointment and therefore she was not approved The patient shows me a small wound on her left fourth metatarsal head on her phone. She developed rapid discoloration in the plantar aspect of the left foot and she was admitted to hospital from 2/2 through 05/10/17 with wet gangrene of the left foot osteomyelitis of the fourth metatarsal heads. She was admitted acutely ill with a temperature of 103. She was started on broad-spectrum vancomycin and cefepime. On 05/06/17 she was taken to the OR by [name] her podiatric surgeon for an incision and drainage irrigation of the left foot wound. Cultures from this surgery revealed group be strep and anaerobes. she was seen by Dr.Xu of orthopedic surgery and scheduled for a below-knee amputation which she u refused. Ultimately she was discharged on Levaquin and Flagyl for one month. MRI 05/05/17 done while she was in the hospital showed abscess adjacent to the fourth metatarsal head and neck small abscess around the fourth flexor tendon. Inflammatory phlegmon and gas in the soft tissues along the lateral aspect of the fourth phalanx. Findings worrisome for osteomyelitis involving the fourth proximal and middle phalanx and also the third and fourth metatarsals. Finally the patient had actually shortly before this followed up with Dr. Raul Del at no time on 04/29/17. He felt that her left femoropopliteal bypass was patent he felt that her left-sided toe pressures more than adequate for healing a wound on the left foot. This was before her acute presentation. Her noninvasive diabetes are listed  above. 05/28/17; she is started hyperbarics. The patient tells me that for some reason she was not actually on Levaquin but I think on ciprofloxacin. She was on Flagyl. She only started her Levaquin yesterday due to some difficulty with the pharmacy and perhaps her sister picking it up. She has an appointment with [name] tomorrow and with infectious disease early next week. She has no new complaints 06/06/17; the patient continues in hyperbarics. She saw [name] on 05/29/17 who is her podiatric surgeon. He is elected for a transmetatarsal amputation on 06/27/17. I'm not sure at what level he plans to do this amputation. The patient is unaware She also saw Dr. Megan Salon of infectious disease who elected to continue her on current antibiotics I think this is ciprofloxacin and Flagyl. I'll need to clarify with her tomorrow if she actually has this. We're using silver alginate to the actual wound. Necrotic surface today with material under the flap of her foot. Original MRI showed abscesses as well as osteomyelitis of the proximal and middle fourth phalanx and the third and fourth metatarsal heads 06/11/17; patient continues in hyperbarics and continues on  oral antibiotics. She is doing well. The wound looks better. The necrotic part of this under the flap in her superior foot also looks better. she is been to see [name]. I haven't had a chance to look at his note. Apparently he has put the transmetatarsal amputation on hold her request it is still planning to take her to the OR for debridement and product application ACEL. I'll see if I can find his note. I'll therefore leave product ordering/requests to [name] for now. I was going to look at Dermagraft 06/18/17-she is here in follow-up evaluation for bilateral foot wounds. She continues with hyperbaric therapy. She states she has been applying manuka honey to the right plantar foot and alternate manuka honey and silver alginate to the left foot,  despite our orders. We will continue with same treatment plan and she will follo up next week. 06/25/17; I have reviewed [name] last note from 3/11. She has operative debridement in 2 days' time. By review his note apparently they're going to place there is skin over the majority of this wound which is a good choice. She has a small satellite area at the most proximal part of this wound on the left plantar foot. The area on the right plantar foot we've been using silver alginate and it is close to healing. 07/02/17; unfortunately the patient was not easily approved for [name] proposed surgery. I'm not completely certain what the issue is. She has been using silver alginate to the wound she has completed a first course of hyperbarics. She is still on Levaquin and Flagyl. I have really lost track of the time course here.I suspect she should have another week to 2 of antibiotics. I'll need to see if she is followed up with infectious disease Dr. Megan Salon 07/09/17; the patient is followed up with Dr. Megan Salon. 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 She continues on Levaquin and metronidazole continuing both of these for now I think she is been on fr about 6 weeks. She still has some drainage but no pain. No fever. Her had been plans for her to go to the OR for operative debridement with her podiatrist [name], I am not exactly sure where that is. I'll probably slip a note to [name] today. I note that she follows with Dr. Dwyane Dee of endocrinology. We have her recertified for hyperbaric oxygen. I have not heard about Dermagraft however I'll see if [name] is planning a skin substitute as well 07/16/17; the patient tells me she is just about out of [place]. I'll need to check Dr. Hale Bogus last notes on this. She states she has plenty of Flagyl however. She comes in today complaining of pain in the right lateral foot which she said lasted for about a day.  The wound on the right foot is actually much more medially. She also tells me that the [facility] cost a lot of pain in the left foot wound and she turned back to silver alginate. Finally Dermagraft has a $696 per application co-pay. She cannot afford this 07/23/17; patient arrives today with the wound not much smaller. There is not much new to add. She has not heard from [name] all try to put in a call to them today. She was asking about Dermagraft again and she has an over $789 per application co-pay she states that she would be willing to try to do a payment plan. I  been tried to avoid this. We've been using silver alginate, I'll change to [facility] 07/30/17-She is here in follow-up evaluation for left foot ulcer. She continues hyperbaric medicine. The left foot ulcer is stable we will continue with same treatment plan 08/06/17; she is here for evaluation of her left foot ulcer. Currently being treated for hyperbarics or underlying osteomyelitis. She is completed antibiotics. The left foot ulcer is better smaller with healthier looking granulation. For various reasons I am not really clear on we never got her back to the OR with [name]. He did not respond to my secure text message. Nevertheless I think that surgery on this point is not necessary nor am I completely clear that a skin substitute is necessary The patient is complaining about pain on the outside of her right foot. 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 There is no erythema. She also states the foot is warm versus her other part of her upper leg and this is largely true. It is not totally clear to me what's causing this. She thinks it's different from her usual neuropathy pain 08/13/17; she arrives in clinic today with a small wound which is superficial on her right first metatarsal head. 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 She tells [place] she was up on her feet over the Mother's Day  celebration. The large wound is on the left foot. Continues with hyperbarics for underlying osteomyelitis. We're using Hydrofera Blue. She asked me today about where we were with Dermagraft. I had actually excluded this because of the co-pay however she wants to assume this therefore I'll recheck the co-pay an order for next week. 08/20/17; the patient agreed to accept the co-pay of the first Apligraf which we applied today. She is disappointed she is finishing hyperbarics will run this through the insurance on the extent of the foot infection and the extent of the wound that she had however she is already had 60 dive's. Dermagraft No. 1 08/27/17; Dermagraft No. 2. She is not eligible for any more hyperbaric treatments this month. She reports a fair amount of drainage and she actually changed to the external dressings without disturbing the direct contact layer 09/03/17; the patient arrived in clinic today with the wound superficially looking quite healthy. Nice vibrant red tissue with some advancing epithelialization although not as much adherence of the flap as I might like. However she noted on her own fourth toe some bogginess and she brought that to our attention. Indeed this was boggy feeling like a possibility of subcutaneous fluid. 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 She is not been unwell. We've been using Dermagraft 09/10/17; the culture that I did not last week was MRSA. She saw Dr. Megan Salon this morning who is going to start her on vancomycin. I had sent him a secure a text message yesterday. I also spoke with her podiatric surgeon [name] about surgery on this foot the options for conserving a functional foot etc. Promised me he would see her and will make back consultation today. Paradoxically her actual wound on the plantar aspect of her left foot looks really quite good. I had given her 5 days worth of  Baxdella to cover her for MRSA. Her MRI came back showing osteomyelitis within the third metatarsal shaft and head and base of the third and fourth proximal phalanx. She had extensive inflammatory changes throughout the  soft tissue of the lateral forefoot. With an ill-defined fluid around the fourth metatarsal extending into the plantar and dorsal soft tissues 09/19/17; the patient is actually on oral Septra and Flagyl. She apparently refused IV vancomycin. She also saw [name] at my request who is planning her for a left BKA sometime in mid July. MRI showed osteomyelitis within the third metatarsal shaft and head and the basis of the third and fourth proximal phalanx. I believe there was felt to be possible septic arthritis involving the third MTP. 09/26/17; the patient went back to Dr. Megan Salon at my suggestion and is now receiving IV daptomycin. Her wound continues to look quite good making the decision to proceed with a transmetatarsal amputation although more difficult for the patient. I believe in my extensive discussions with her she has a good sense of the pros and cons of this. I don't NV the tuft decision she has to make. She has an appointment with [name] I believe in mid July and I previously spoken to him about this issue Has we had used 3 previous Dermagraft. Given the condition of the wound surface I went ahead and added the fourth one today area and I did this not fully realizing that she'll be traveling to [place] next week. I'm hopeful she can come back in 2 weeks 10/21/17; Her same Dermagraft on for about 3-1/2 weeks. In spite of this the wound arrives looking quite healthy. There is been a lot of healing dimensions are smaller. Looking at the square shaped wound she has now there is some undermining and some depth medially under the undermining although I cannot palpate any bone. No surrounding infection is obvious. She has difficult questions about how to look at this going  forward vis--vis amputations versus continued medical therapy. T be truthful the wound is looks o so healthy and it is continued to contract. Hard to justify foot surgery at this point although I still told her that I think it might come to that if we are not able to eradicate the underlying MRSA. She is still highly at risk and she understands this 11/06/17 on evaluation today patient appears to be doing better in regard to her foot ulcer. She's been tolerating the dressing changes without complication. Currently she is here for her Dermagraft #6. Her wound continues to make excellent progress at this point. She does not appear to have any evidence of infection which is good news. 11/13/17 on evaluation today patient appears to be doing excellent at this time. She is here for repeat Dermagraft application. This is #7. Overall her wound seems to be making great progress. 12/05/17; the patient arrives with the wound in much better condition than when I last saw this almost 6 weeks ago. She still has a small probing area in the left metatarsal head region on the lateral aspect of her foot. We applied her last Dermagraft today. Since the last time she is here she has what appears to a been a blood blister on the plantar aspect of left foot although I don't see this is threatening. There is also a thick raised tissue on the right mid metatarsal head region. This was not there I don't think the last time she was here 3 weeks ago. 12/12/17; the patient continues to have a small programming area in the left metatarsal head region on the lateral aspect of her foot which was the initial large surgical wound. I applied her last Apligraf last week. I'm going  to use Endoform starting today Unfortunately she has an excoriated area in the left mid foot and the right mid foot. The left midfoot looks like a blistered area this was not opened last week it certainly is open today. Using silver alginate on these areas. She  promises me she is offloading this. 12/19/17; the small probing area in the left metatarsal head eyes think is shallower. In general her original wound looks better. We've been using Endoform. The area inferiorly that I think was trauma last week still requires debridement a lot of nonviable surface which I removed. She still has an open open area distally in her foot Similarly on the right foot there is tightly adherent surface debris which I removed. Still areas that don't look completely epithelialized. This is a small open area. We used silver alginate on these areas 12/26/2017; the patient did not have the supplies we ordered from last week including the Endoform. The original large wound on the left lateral foot looks healthy. She still has the undermining area that is largely unchanged from last week. She has the same heavily callused raised edged wounds on the right mid and left midfoot. Both of these requiring debridement. We have been using silver alginate on these areas 01/02/2018; there is still supply issues. We are going to try to use Prisma but I am not sure she actually got it from what she is saying. She has a new open area on the lateral aspect of the left fourth toe [previous fifth ray amputation]. Still the one tunneling area over the fourth metatarsal head. The area is in the midfoot bilaterally still have thick callus around them. She is concerned about a raised swelling on the lateral aspect of the foot. However she is completely insensate 01/10/2018; we are using Prisma to the wounds on her bilateral feet. Surprisingly the tunneling area over the left fourth metatarsal head that was part of her original surgery has closed down. She has a small open area remaining on the incision line. 2 open areas in the midfoot. 02/10/2018; the patient arrives back in clinic after a month hiatus. She was traveling to visit family in [place]. Is fairly clear she was not offloading the areas on  her feet. The original wound over the left lateral foot at the level of metatarsal heads is reopened and probes medially by about a centimeter or 2. She notes that a week ago she had purulent drainage come out of an area on the left midfoot. Paradoxically the worst area is actually on the right foot is extensive with purulent drainage. We will use silver alginate today 02/17/2018; the patient has 3 wounds one over the left lateral foot. She still has a small area over the metatarsal heads which is the remnant of her original surgical wound. This has medial probing depth of roughly 1.4 cm somewhat better than last week. The area on the right foot is larger. We have been using silver alginate to all areas. The area on the right foot and left foot that we cultured last week showed both Klebsiella and Proteus. Both of these are quinolone sensitive. The patient put her's self on Bactrim and Flagyl that she had left hanging around from prior antibiotic usages. She was apparently on this last week when she arrived. I did not realize this. Unfortunately the Bactrim will not cover either 1 of these organisms. We will send in Cipro 500 twice daily for a week 03/04/2018; the patient has 2 wounds on  the left foot one is the original wound which was a surgical wound for a deep DFU. At one point this had exposed bone. She still has an area over the fourth metatarsal head that probes about 1.4 cm although I think this is better than last week. I been using silver nitrate to try and promote tissue adherence and been using silver alginate here. She also has an area in the left midfoot. This has some depth but a small linear wound. Still requiring debridement. On the right midfoot is a circular wound. A lot of thick callus around this area. We have been using silver alginate to all wound areas She is completed the ciprofloxacin I gave her 2 weeks ago. 03/11/2018; the patient continues to have 2 open areas on the left  foot 1 of which was the original surgical wound for a deep DFU. Only a small probing area remains although this is not much different from last week we have been using silver alginate. The other area is on the midfoot this is smaller linear but still with some depth. We have been using silver alginate here as well On the right foot she has a small circular wound in the mid aspect. This is not much smaller than last time. We have been using silver alginate here as well 03/18/2018; she has 3 wounds on the left foot the original surgical wound, a very superficial wound in the mid aspect and then finally the area in the mid plantar foot. She arrives in today with a very concerning area in the wound in the mid plantar foot which is her most proximal wound. There is undermining here of roughly 1-1/2 cm superiorly. Serosanguineous drainage. She tells me she had some pain on for over the weekend that shot up her foot into her thigh and she tells me that she had a nodule in the groin area. She has the single wound in the right foot. We are using endoform to both wound areas 03/24/2018; the patient arrives with the original surgical wound in the area on the left midfoot about the same as last week. There is a collection of fluid under the surface of the skin extending from the surgical wound towards the midfoot although it does not reach the midfoot wound. The area on the right foot is about the same. Cultures from last week of the left midfoot wound showed abundant Klebsiella abundant Enterococcus faecalis and moderate methicillin resistant staph I gave her Levaquin but this would have only covered the Klebsiella. She will need linezolid 04/01/2018; she is taking linezolid but for the first few days only took 1 a day. I have advised her to finish this at twice daily dosing. In any case all of her wounds are a lot better especially on the left foot. The original surgical wound is closed. The area on the left  midfoot considerably smaller. The area on the right foot also smaller. 04/08/2018; her original surgical wound/osteomyelitis on the left foot remains closed. She has area on the left foot that is in the midfoot area but she had some streaking towards this. This is not connected with her original wound at least not visually. Small wound on the right midfoot appears somewhat smaller. 04/15/18; both wounds looks better. Original wound is better left midfoot. Using silver alginate 1/21; patient states she uses saltwater soak in, stones or remove callus from around her wounds. She is also concerned about a blood blister she had on the left foot but  it simply resolved on its own. We've been using silver alginate 1/28; the patient arrives today with the same streaking area from her metatarsals laterally [the site of her original surgical wound] down to the middle of her foot. There is some drainage in the subcutaneous area here. This concerns me that there is actually continued ongoing infection in the metatarsals probably the fourth and third. This fixates an MRI of the foot without contrast [chronic renal failure] The wound in the mid part of the foot is small but I wonder whether this area actually connects with the more distal foot. The area on the right midfoot is probably about the same. Callus thick skin around the small wound which I removed with a curette we have been using silver alginate on both wound areas 2/4; culture I did of the draining site on the left foot last time grew methicillin sensitive staph aureus. MRI of the left foot showed interval resolution of the findings surrounding the third metatarsal joint on the prior study consistent with treated osteomyelitis. Chronic soft tissue ulceration in the plantar and lateral aspect of the forefoot without residual focal fluid collection. No evidence of recurrent osteomyelitis. Noted to have the previous amputation of the distal first phalanx and  fifth ray MRI of the right foot showed no evidence of osteomyelitis I am going to treat the patient with a prolonged course of antibiotics directed against MSSA in the left foot 2/11; patient continues on cephalexin. She tells me she had nausea and vomiting over the weekend and missed 2 days. In general her foot looks much the same. She has a small open area just below the left fourth metatarsal head. A linear area in the left midfoot. Some discoloration extending from the inferior part of this into the left lateral foot although this appears to be superficial. She has a small area on the right midfoot which generally looks smaller after debridement 2/18; the patient is completing his cephalexin and has another 2 days. She continues to have open areas on the left and right foot. 2/25; she is now off antibiotics. The area on the left foot at the site of her original surgical wound has closed yet again. She still has open areas in the mid part of her foot however these appear smaller. The area on the right mid foot looks about the same. We have been using silver alginate She tells me she had a serious hypoglycemic spell at home. She had to have EMS called and get IV dextrose 3/3; disappointing on the left lateral foot large area of necrotic tissue surrounding the linear area. This appears to track up towards the same original surgical wound. Required extensive debridement. The area on the right plantar foot is not a lot better also using silver 3/12; the culture I did last time showed abundant enterococcus. I have prescribed Augmentin, should cover any unrecognized anaerobes as well. In addition there were a few MRSA and Serratia that would not be well covered although I did not want to give her multiple antibiotics. She comes in today with a new wound in the right midfoot this is not connected with the original wound over her MTP a lot of thick callus tissue around both wounds but once again she  said she is not walking on these areas 3/17-Patient comes in for follow-up on the bilateral plantar wounds, the right midfoot and the left plantar wound. Both these are heavily callused surrounding the wounds. We are continuing to use silver  alginate, she is compliant with offloading and states she uses a wheelchair fairly often at home 3/24; both wound areas have thick callus. However things actually look quite a bit better here for the majority of her left foot and the right foot. 3/31; patient continues to have thick callused somewhat irritated looking tissue around the wounds which individually are fairly superficial. There is no evidence of surrounding infection. We have been using silver alginate however I change that to [facility] today 4/17; patient returns to clinic after having a scare with Covid she tested negative in her primary doctor's office. She has been using Hydrofera Blue. She does not have an open area on the right foot. On the left foot she has a small open area with the mid area not completely viable. She showed me pictures of what looks like a hemorrhagic blister from several days ago but that seems to have healed over this was on the lateral left foot 4/21; patient comes in to clinic with both her wounds on her feet closed. However over the weekend she started having pain in her right foot and leg up into the thigh. She felt as though she was running a low-grade fever but did not take her temperature. She took a doxycycline that she had leftover and yesterday a single Septra and metronidazole. She thinks things feel somewhat better. 4/28; duplex ultrasound I ordered last week was negative for DVT or superficial thrombophlebitis. She is completed the doxycycline I gave her. States she is still having a lot of pain in the right calf and right ankle which is no better than last week. She cannot sleep. She also states she has a temperature of up to 101, coughing and complaining  of visual loss in her bilateral eyes. Apparently she was tested for Covid 2 weeks ago at [facility] and that was negative. Readmission: 09/03/18 patient presents back for reevaluation after having been evaluated at the end of April regarding erythema and swelling of her right lower extremity. Subsequently she ended up going to the hospital on 07/29/18 and was admitted not to be discharged until 08/08/18. Unfortunately it was noted during the time that she was in the hospital that she did have methicillin-resistant Staphylococcus aureus as the infection noted at the site. It was also determined that she did have osteomyelitis which appears to be fairly significant. She was treated with vancomycin and in fact is still on IV vancomycin at dialysis currently. This is actually slated to continue until 09/12/18 at least which will be the completion of the six weeks of therapy. Nonetheless based on what I'm seeing at this point I'm not sure she will be anywhere near ready to discontinue antibiotics at that time. Since she was released from the hospital she was seen by [name] who is her podiatrist on 08/27/18. His note specifically states that he is recommended that the patient needs of one knee amputation on the right as she has a life- threatening situation that can lead quickly to sepsis. The patient advised she would like to try to save her leg to which [name] apparently told her that this was against all medical advice. She also want to discontinue the Wound VAC which had been initiated due to the fact that she wasn't pleased with how the wound was looking and subsequently she wanted to pursue applying Medihoney at that time. He stated that he did not believe that the right lower extremity was salvageable and that the patient understood but  would still like to attempt hyperbaric option therapy if it could be of any benefit. She was therefore referred back to [place] for further evaluation. He plans to see her back  next week. Upon inspection today patient has a significant amount purulent drainage noted from the wound at this point. The bone in the distal portion of her foot also appears to be extremely necrotic and spongy. When I push down on the bone it bubbles and seeps purulent drainage from deeper in the end of the foot. I do not think that this is likely going to heal very well at all and less aggressive surgical debridement were undertaken more than what I believe we can likely do here in our office. 09/12/2018; I have not seen this patient since the most recent hospitalization although she was in our clinic last week. I have reviewed some of her records from a complex hospitalization. 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 There is situation was complicated by MRSA bacteremia and acute on chronic renal failure now on dialysis. She is receiving vancomycin at dialysis. We started her on Dakin's wet-to-dry last week she is changing this daily. There is still purulent drainage coming out of her foot. Although she is apparently "agreeable" to a below-knee amputation which is been suggested by multiple clinicians she wants this to be done in [place]. She apparently has a telehealth visit with that provider sometime in late [place] 6/24. I have told her I think this is probably too long. Nevertheless I could not convince her to allow a local doctor to perform BKA. 09/19/2018; the patient has a large necrotic area on the right anterior foot. She has had previous transmetatarsal amputations. Culture I did last week showed MRSA nothing else she is on vancomycin at dialysis. She has continued leaking purulent drainage out of the distal part of the large circular wound on the right anterior foot. She apparently went to see Dr. 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 Somehow that translated into her being referred to plastic surgery  for debridement of the area. I gather she basically refused amputation although I do not have a copy of Dr. Berenice Primas notes. The patient really wants to have a trial of hyperbaric oxygen. I agreed with initial assessment in this clinic that this was probably too far along to benefit however if she is going to have plastic surgery I think she would benefit from ancillary hyperbaric oxygen. The issue here is that the patient has benefited as maximally as any patient I have ever seen from hyperbaric oxygen therapy. Most recently she had exposed bone on the lateral part of her left foot after a surgical procedure and that actually has closed. She has eschared areas in both heels but no open area. She is remained systemically well. I am not optimistic that anything can be done about this but the patient is very clear that she wants an attempt. The attempt would include a wound VAC further debridements and hyperbaric oxygen along with IV antibiotics. 6/26; I put her in for a trial of hyperbaric oxygen only because of the dramatic response she has had with wounds on her left midfoot earlier this year which was a surgical wound that went straight to her bone over the metatarsal heads and also remotely the left third toe. We will see if we can get this through our review process and insurance. She arrives in clinic with again purulent  material pouring out of necrotic bone on the top of the foot distally. There is also some concerning erythema on the front of the leg that we marked. It is bit difficult to tell how tender this is because of neuropathy. I note from infectious disease that she had her vancomycin extended. 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 She had the wound VAC on for part of the week. The rest of the time she is putting various things on this including Medihoney, "ionized water" silver sorb gel etc. 7/7; follow-up along with HBO. She is still on vancomycin at  dialysis. She has a large open area on the dorsal right foot and a small dark eschar area on her heel. There is a lot less erythema in the area and a lot less tenderness. From an infection point of view I think this is better. She still has a lot of necrosis in the remaining right forefoot [previous TMA] we are still using the wound VAC in this area 7/16; follow-up along with HBO. I put her on linezolid after she finished her vancomycin. We started this last Friday I gave her 2 weeks worth. I had the expectation that she would be operatively debrided by Dr. Marla Roe but that still has not happened yet. Patient phoned the office this week. She arrives for review today after HBO. The distal part of this wound is completely necrotic. Nonviable pieces of tendon bone was still purulent drainage. Also concerning that she has black eschar over the heel that is expanding. I think this may be indicative of infection in this area as well. She has less erythema and warmth in the ankle and calf but still an abnormal exam 7/21 follow-up along with HBO. I will renew her linezolid after checking a CBC with differential monitoring her blood counts especially her platelets. She was supposed to have surgery yesterday but if I am reading things correctly this was canceled after her blood sugar was found to be over 500. I thought Dr. Marla Roe who called me said that they were sending her to the ER but the patient states that was not the case. 7/28. Follow-up along with HBO. She is on linezolid I still do not have any lab work from dialysis even though I called last week. The patient is concerned about an area on her left lateral foot about the level of the base of her fifth metatarsal. I did not really see anything that ominous here however this patient is in [place] ability to point out problems that she is sensing and she has been accurate in the past Finally she received a call from Dr. Marla Roe who is referring  her to another orthopedic surgeon stating that she is too booked up to take her to the operating room now. Was still using a wound VAC on the foot 8/3 -Follow-up after HBO, she is got another week of linezolid, she is to call ID for an appointment, x-rays of both feet were reviewed, the left foot x-ray with third MTP joint osteo- Right foot x-ray widespread osteo-in the right midfoot Right ankle x-ray does not show any active evidence of infection 8/11-Patient is seen after HBO, the wounds on the right foot appear to be about the same, the heel wound had some necrotic base over tendon that was debrided with a curette 8/21; patient is seen after HBO. The patient's wound on her dorsal foot actually looks reasonably good and there is substantial amount of epithelialization  however the open area distally still has a lot of necrotic debris partially bone. I cannot really get a good sense of just how deep this probes under the foot. She has been pressuring me this week to order medical maggots through a company in [place] for her. The problem I have is there is not a defined wound area here. On the positive side there is no purulence. She has been to see infectious disease she is still on Septra DS although I have not had a chance to review their notes 8/28; patient is seen in conjunction with HBO. The wounds on her foot continued to improve including the right dorsal foot substantially the, the distal part of this wound and the area on the right heel. We have been using a wound VAC over this chronically. She is still on trimethoprim as directed by infectious disease 9/4; patient is seen in conjunction with HBO. Right dorsal foot wound substantially anteriorly is better however she continues to have a deep wound in the distal part of this that is not responding. We have been using silver collagen under border foam Area on the right plantar medial heel seems better. We have been using Hydrofera  Blue 12/12/18 on evaluation today patient appears to be doing about the same with regard to her wound based on prior measurements. She does have some necrotic tissue noted on the lateral aspect of the wound that is going require a little bit of sharp debridement today. This includes what appears to be potentially either severely necrotic bone or tendon. Nonetheless other than that she does not appear to have any severe infection which is good news 9/18; it is been 2 weeks since I saw this wound. She is tolerating HBO well. Continued dramatic improvement in the area on the right dorsal foot. She still has a small wound on the heel that we have been using Hydrofera Blue. She continues with a wound VAC 9/24; patient has to be seen emergently today with a swelling on her right lateral lower leg. She says that she told Dr. Evette Doffing about this and also myself on a couple of occasions but I really have no recollection of this. She is not systemically unwell and her wound really looked good the last time I saw this. She showed this to providers at dialysis and she was able to verify that she was started on cephalexin today for 5 doses at dialysis. She dialyzes on Tuesday Thursday and Saturday. 10/2; patient is seen in conjunction with HBO. The area that is draining on the right anterior medial tibia is more extensive. Copious amounts of serosanguineous drainage with some purulence. We are still using the wound VAC on the original wound then it is stable. Culture I did of the original IandD showed MRSA I contacted dialysis she is now on vancomycin with dialysis treatments. I asked them to run a month 10/9; patient seen in conjunction with HBO. She had a new spontaneous open area just above the wound on the right medial tibia ankle. More swelling on the right medial tibia. Her wound on the foot looks about the same perhaps slightly better. There is no warmth spreading up her leg but no obvious erythema. her MRI  of the foot and ankle and distal tib-fib is not booked for next Friday I discussed this with her in great detail over multiple days. it is likely she has spreading infection upper leg at least involving the distal 25% above the ankle. She knows that  if I refer her to orthopedics for infectious disease they are going to recommend amputation and indeed I am not against this myself. We had a good trial at trying to heal the foot which is what she wanted along with antibiotics debridement and HBO however she clearly has spreading infection [probably staph aureus/MRSA]. Nevertheless she once again tells me she wants to wait the left of the MRI. She still makes comments about having her amputation done in [place]. 10/19; arrives today with significant swelling on the lateral right leg. Last culture I did showed Klebsiella. Multidrug-resistant. Cipro was intermediate sensitivity and that is what I have her on pending her MRI which apparently is going to be done on Thursday this week although this seems to be moving back and forth. She is not systemically unwell. We are using silver alginate on her major wound area on the right medial foot and the draining areas on the right lateral lower leg 10/26; MRI showed extensive abscess in the anterior compartment of the right leg also widespread osteomyelitis involving osseous structures of the midfoot and portions of the hindfoot. Also suspicion for osteomyelitis anterior aspect of the distal medial malleolus. Culture I did of the purulence once again showed a multidrug-resistant Klebsiella. I have been in contact with nephrology late last week and she has been started on cefepime at dialysis to replace the vancomycin We sent a copy of her MRI report to Dr. Geroge Baseman in [place] who is an orthopedic surgeon. The patient takes great stock in his opinion on this. She says she will go to [place] to have her leg amputated if Dr. Geroge Baseman does not feel there is any  salvage options. 11/2; she still is not talk to her orthopedic surgeon in [place]. Apparently he will call her at [phone] this afternoon. The quality of this is she has not allowed me to refer her anywhere. She has been told over and over that she needs this amputated but has not agreed to be referred. She tells me her blood sugar was 600 last night but she has not been febrile. 11/9; she never did got a call from the orthopedic surgeon in [place] therefore that is off the radar. We have arranged to get her see orthopedic surgery at [facility]. She still has a lot of draining purulence coming out of the new abscess in her right leg although that probably came from the osteomyelitis in her right foot and heel. Meanwhile the original wound on the right foot looks very healthy. Continued improvement. The issue is that the last MRI showed osteomyelitis in her right foot extensively she now has an abscess in the right anterior lower leg. There is nobody in [place] who will offer this woman anything but an amputation and to be honest that is probably what she needs. I think she still wants to talk about limb salvage although at this point I just do not see that. She has completed her vancomycin at dialysis which was for the original staph aureus she is still on cefepime for the more recent Klebsiella. She has had a long course of both of these antibiotics which should have benefited the osteomyelitis on the right foot as well as the abscess. 11/16; apparently [place] elective surgery is shut down because of COVID-19 pandemic. I have reached out to some contacts at [facility] to see if we can get her an orthopedic appointment there. I am concerned about continually leaving this but for the moment everything is static. In fact her  original large wound on this foot is closing down. It is the abscess on the right anterior leg that continues to drain purulent serosanguineous material. She is not currently  on any antibiotics however she had a prolonged course of vancomycin [1 month] as well as cefepime for a month 02/24/2019 on evaluation today patient appears to be doing better than the last time I saw her. This is not a patient that I typically see. With that being said I am covering for Dr. Dellia Nims this week and again compared to when I last saw her overall the wounds in particular seem to be doing significantly better which is good news. With that being said the patient tells me several disconcerting things. She has not been able to get in to see anyone for potential debridement in regard to her leg wounds although she tells me that she does not think it is necessary any longer because she is taking care of that herself. She noticed a string coming out of the lower wound on her leg over the last week. The patient states that she subsequently decided that we must of pack something in there and started pulling the string out and as it kept coming and coming she realized this was likely her tendon. With that being said she continued to remove as much of this as she could. She then I subsequently proceeded to using tubes of antibiotic ointment which she will stick down into the wound and then scored as much as she can until she sees it coming out of the other wound opening. She states that in doing this she is actually made things better and there is less redness and irritation. With regard to her foot wound she does have some necrotic tendon and tissue noted in one small corner but again the actual wound itself seems to be doing better with good granulation in general compared to my last evaluation. 12/7; continued improvement in the wound on the substantial part of the right medial foot. Still a necrotic area inferiorly that required debridement but the rest of this looks very healthy and is contracting. She has 2 wounds on the right lateral leg which were her original drainage sites from her abscess but  all of this looks a lot better as well. She has been using silver alginate after putting antibiotic biotic ointment in one wound and watching it come out the other. I have talked to her in some detail today. I had given her names of orthopedic surgeons at [facility] for second opinion on what to do about the right leg. I do not think the patient never called them. She has not been able to get a hold of the orthopedic surgeon in [place] that she had put a lot of faith in as being somebody would give her an opinion that she would trust. I talked to her today and said even if I could get her in to another orthopedic surgeon about the leg which she accept an amputation and she said she would not therefore I am not going to press this issue for the moment 12/14; continued improvement in his substantial wound on the right medial foot. There is still a necrotic area inferiorly with tightly adherent necrotic debris which I have been working on debriding each time she is here. She does not have an orthopedic appointment. Since last time she was here I looked over her cultures which were essentially MRSA on the foot wound and gram-negative rods in the abscess  on the anterior leg. 12/21; continued improvement in the area on the right medial foot. She is not up on this much and that is probably a good thing since I do not know it could support continuous ambulation. She has a small area on the right lateral leg which were remanence of the IandD's I did because of the abscess. I think she should probably have prophylactic antibiotics I am going to have to look this over to see if we can make an intelligent decision here. In the meantime her major wound is come down nicely. Necrotic area inferiorly is still there but looks a lot better 04/06/2019; she has had some improvement in the overall surface area on the right medial foot somewhat narrowedr both but somewhat longer. The areas on the right lateral leg which  were initial IandD sites are superficial. Nothing is present on the right heel. We are using silver alginate to the wound areas 1/18; right medial foot somewhat smaller. Still a deep probing area in the most distal recess of the wound. She has nothing open on the right leg. She has a new wound on the plantar aspect of her left fourth toe which may have come from just pulling skin. The patient using Medihoney on the wound on her foot under silver alginate. I cannot discourage her from this 2/1; 2-week follow-up using silver alginate on the right foot and her left fourth toe. The area on the right dorsal foot is contracted although there is still the deep area in the most distal part of the wound but still has some probing depth. No overt infection 2/15; 2-week follow-up. She continues to have improvement in the surface area on the dorsal right foot. Even the tunneling area from last time is almost closed. The area that was on the plantar part of her left fourth toe over the PIP is indeed closed 3/1; 2-week follow-up. Continued improvement in surface area. The original divot that we have been debriding inferiorly I think has full epithelialization although the epithelialization is gone down into the wound with probably 4 mm of depth. Even under intense illumination I am unable to see anything open here. The remanence of the wound in this area actually look quite healthy. We have been using silver alginate 3/15; 2-week follow-up. Unfortunately not as good today. She has a comma shaped wound on the dorsal foot however the upper part of this is larger. Under illumination debris on the surface She also tells [place] that she was on her right leg 2 times in the last couple of weeks mostly to reach up for things above her head etc. She felt a sharp pain in the right leg which she thinks is somewhere from the ankle to the knee. The patient has neuropathy and is really uncertain. She cannot feel her foot so she  does not think it was coming from there 3/29; 2-week follow-up. Her wound measures smaller. Surface of the wound appears reasonable. She is using silver alginate with underlying Medihoney. She has home health. X-rays I did of her tib-fib last time were negative although it did show arterial calcification 4/12; 2-week follow-up. Her wound measures smaller in length. Using manuka honey with silver alginate on top. She has home health. 4/26; 2-week follow-up. Her wound is smaller but still very adherent debris under illumination requiring debridement she has been using manuka honey with silver alginate. She has home health 08/28/19-Wound has about the same size, but with a layer of eschar at  the lateral edge of the amputation site on the right foot. Been using Hydrofera Blue. She is on suppressive Bactrim but apparently she has been taking it twice daily 6/7; I have not seen this wound and about 6 weeks. Since then she was up in [place]. By her own admission she was walking on the foot because she did not have a wheelchair. The wound is not nearly as healthy looking as it was the last time I saw this. We ordered different things for her but she only uses Medihoney and silver alginate. As far as I know she is on suppressive trimethoprim sulfamethoxazole. She does not admit to any fever or chills. Her CBGs apparently are at baseline however she is saying that she feels some discomfort on the lateral part of her ankle I looked over her last inflammatory markers from the summer 2020 at which time she had a deeply necrotic infected wound in this area. On 11/10/2018 her sedimentation rate was 56 and C-reactive protein 9.9. This was 107 and 29 on 07/29/2018. 6/17; the patient had a necrotic wound the last time she was here on the right dorsal foot. After debridement I did a culture. This showed a very resistant ESBL Klebsiella as well as Enterococcus. Her x-ray of the foot which was done because of warmth and  some discomfort showed bone destruction within the carpal bones involving the navicular acute cuboid lateral middle cuneiforms but essentially unchanged from her prior study which was done on 10/29/2018. The findings were felt to represent chronic osteomyelitis. We did inflammatory markers on her. Her white count was 5.25 sedimentation rate 16 and C-reactive protein at 11.1. Notable for the fact that in August 2020 her CRP was 9.9 and sedimentation rate 56. I have looked at her x-rays. It is true that the bone destruction is very impressive however the patient came into this clinic for the wound on her right foot with pieces of bone literally falling out anteriorly with purulent material. I am not exactly sure I could have expected anything different. She has not been systemically unwell no fever chills or blood sugars have been reasonable. 6/28; she arrives with a right heel closed. The substantial area on the right anterior foot looks healthy. Much better looking surface. I think we can change to [facility] seems to help this previously. She is getting her antibiotics at dialysis she should be just about finished 7/9; changed to [facility] last week. Surface wound looks satisfactory not much change in surface area however. She is going to [place] state next week this is usually a difficult thing for this patient follow-up will be for 2 weeks. 7/23; using Hydrofera Blue. She returns from her trip and the wound looks surprisingly good. Usually when this patient goes on trips she comes back with a lot of problems with the wound. She is saying that she sometimes feels an episodic "crunching" feeling on the lateral part of the foot. She is neuropathic and not feeling pain but wonders whether this could be a neuropathic dysesthesia. 11/13/19-Patient returns after 3 weeks, the wound itself is stable and patient states that there is nothing new going on she is on some extra anxiety medications and  is resisting the temptation to pick at the dry skin around the wound. 9/20; patient has not been here in over a month and I have not seen her in 2 months. The wound in terms of size I think is about the same. There is no exposed bone.  She has a nonviable surface on this. She is supposed to be using [facility] however she is also been using some form of honey preparation as well as a silver-based dressings. I do not think she has any pattern to this. 10/4; 2-week follow-up. Patient has been using some form of spray which she says has honey and silver to purchase this online she has been covering it with gauze. In spite of this the wound actually looks quite good. The deeper divot distally appears to be close down. There is a rim of epithelialization. 10/18; 2-week follow-up. Patient has been using her Hydrofera Blue covered with her silver honey spray that she got online. 11/1; 2-week follow-up. She is using Hydrofera Blue with a silver honey spray. Wound bed is measuring smaller. She has noticed that her foot is warmer on the right. She is concerned about infection. For a long period of time I had her on prophylactic trimethoprim sulfamethoxazole DS 1 tablet daily. She is asking for this to be restarted. The patient is walking on this foot because of repairs that are being done in a home her but her room is on the second floor she has to go up and down stairs. I have cautioned against this however as usual she will do exactly what she wants to do 11/15; 2-week follow-up. She uses Hydrofera Blue with a silver/honey spray which I have never heard of. I think her wound looks about the same. Some epithelialization. No evidence that this is infected. I think she is walking on this more than we agreed on. She is going on extensive vacation over Thanksgiving 12/3; 2-week follow-up. She is using Hydrofera Blue however over Thanksgiving she ran out of this and she is simply been using Medihoney. In spite of  this her wound is smaller almost divided into 2 now. She traveled extensively over Thanksgiving and actually looks quite good in spite of this. Usually this is been a marker of problems for her 12/17; 2-week follow-up. She is using Hydrofera Blue. The wound is smaller. Debris on the surface of this is fibrinous. She is traveling to [place] over the holidays which never bodes well for her wounds. I think she is walking more on her feet then she is even willing to admit and she tells me she does walk 2/11; using Hydrofera Blue. Her wounds are contracting however she walks in the clinic with a history that she has not been able to eat she has had vomiting. She also has right eye problems. Her blood sugar was 567. She had blood work done at dialysis and we called there to get her blood work although it still had not returned although they should have this by the end of the day we were told. She also stated that she was not sure what her blood sugar was as her glucose monitor was not working and not coming to tomorrow. She also for some reason does not think her insulin pump is working well. Her endocrinologist is Dr. Elayne Snare 06/03/2020 upon evaluation today patient actually appears to be doing excellent in regard to her wound. In fact she tells me it was not even bleeding until she picked a dry piece of skin off while we were getting ready to come in and see her today. Fortunately there is no signs of active infection which is great news and overall very pleased. She is going to be seeing her neurologist sometime shortly I believe it might even be on Monday.  Nonetheless there can proceed with the work-up as far as anything else going on with her eye the fact that her right eyelid is drooping. 3/25; right medial dorsal foot. She has superficial areas here that appear to be fully epithelialized she is using Medihoney and silver alginate and some combination. She has a new area on the mid left foot at  roughly the level of the fifth metatarsal base 4/84/8; right medial dorsal foot. Still superficial areas here. I cleaned up 1 of these today she has been using I think mostly Medihoney but I would like her to use silver alginate. The area on the mid part of her foot also looks improved on the left. Since she was last here she tells me that she noted pus in the area of her left foot. She told dialysis about this they did a culture and ultimately she is on IV antibiotics but were not sure which one. They referred her to [name] he said that he did not need to follow her. I have not really verified any of this and I do not know what antibiotic she is on at dialysis Electronic Signature(s) Signed: 07/08/2020 4:45:31 PM By: Linton Ham MD Entered By: Linton Ham on 07/08/2020 13:22:51 -------------------------------------------------------------------------------- Physical Exam Details Patient Name: Date of Service: Nancy LLO [name] 07/08/2020 12:30 PM Medical Record Number: [id number] Patient Account Number: [id number] Date of Birth/Sex: Treating RN: [id number] (50 y.o. [name] Primary Care Provider: [name], [name] Other Clinician: Referring Provider: Treating Provider/Extender: [name], [name] Weeks in Treatment: 96 Notes Wound exam; right dorsal medial foot. Light debridement on the major wound here. Some debris on the surface. The area on the left midfoot laterally still has mild depth there is no erythema here certainly no infection. Electronic Signature(s) Signed: 07/08/2020 4:45:31 PM By: Linton Ham MD Entered By: Linton Ham on 07/08/2020 13:23:28 -------------------------------------------------------------------------------- Physician Orders Details Patient Name: Date of Service: Nancy LLO [name] 07/08/2020 12:30 PM Medical Record Number: [id number] Patient Account Number: [id number] Date of Birth/Sex: Treating RN: [name]  (50 y.o. [name] Primary Care Provider: [name], [name] Other Clinician: Referring Provider: Treating Provider/Extender: [name], [name] Weeks in Treatment: 57 Verbal / Phone Orders: No Diagnosis Coding ICD-10 Coding Code Description L97.514 Non-pressure chronic ulcer of other part of right foot with necrosis of bone E10.621 Type 1 diabetes mellitus with foot ulcer L97.521 Non-pressure chronic ulcer of other part of left foot limited to breakdown of skin Follow-up Appointments Return appointment in 3 weeks. Bathing/ Shower/ Hygiene May shower and wash wound with soap and water. Edema Control - Lymphedema / SCD / Other Bilateral Lower Extremities Elevate legs to the level of the heart or above for 30 minutes daily and/or when sitting, a frequency of: - throughout the day Avoid standing for long periods of time. Moisturize legs daily. - with dressing changes Off-Loading Other: - minimal weight bearing right foot Home Health No change in wound care orders this week; continue Home Health for wound care. May utilize formulary equivalent dressing for wound treatment orders unless otherwise specified. Other Home Health Orders/Instructions: - Interim Wound Treatment Wound #43 - Foot Wound Laterality: Right, Medial Cleanser: Wound Cleanser (Home Health) Every Other Day/30 Days Discharge Instructions: Cleanse the wound with wound cleanser or normal saline prior to applying a clean dressing using gauze sponges, not tissue or cotton balls. Prim Dressing: KerraCel Ag Gelling Fiber Dressing,  2x2 in (silver alginate) [facility]) Every Other Day/30 Days ary Discharge Instructions: Apply silver alginate to wound bed in clinic. pt may use silver honey spray at home Secondary Dressing: Woven Gauze Sponge, Non-Sterile 4x4 in [facility]) Every Other Day/30 Days Discharge Instructions: Apply over primary dressing as directed. Secured With: Elastic Bandage 4 inch (ACE  bandage) (Home Health) Every Other Day/30 Days Discharge Instructions: Secure with ACE bandage as directed. Secured With: [employer], 4.5x3.1 (in/yd) [facility]) Every Other Day/30 Days Discharge Instructions: Secure with Kerlix as directed. Secured With: Paper Tape, 2x10 (in/yd) (Home Health) Every Other Day/30 Days Discharge Instructions: Secure dressing with tape as directed. Wound #50 - Foot Wound Laterality: Plantar, Left Cleanser: Wound Cleanser (Home Health) Every Other Day/30 Days Discharge Instructions: Cleanse the wound with wound cleanser or normal saline prior to applying a clean dressing using gauze sponges, not tissue or cotton balls. Prim Dressing: KerraCel Ag Gelling Fiber Dressing, 2x2 in (silver alginate) (Home Health) Every Other Day/30 Days ary Discharge Instructions: Apply silver alginate to wound bed in clinic. pt may use silver honey spray at home Secondary Dressing: Woven Gauze Sponge, Non-Sterile 4x4 in [facility]) Every Other Day/30 Days Discharge Instructions: Apply over primary dressing as directed. Secured With: Elastic Bandage 4 inch (ACE bandage) (Home Health) Every Other Day/30 Days Discharge Instructions: Secure with ACE bandage as directed. Secured With: [employer], 4.5x3.1 (in/yd) [facility]) Every Other Day/30 Days Discharge Instructions: Secure with Kerlix as directed. Secured With: Paper Tape, 2x10 (in/yd) (Home Health) Every Other Day/30 Days Discharge Instructions: Secure dressing with tape as directed. Electronic Signature(s) Signed: 07/08/2020 4:45:31 PM By: Linton Ham MD Signed: 07/08/2020 5:03:53 PM By: Lorrin Jackson Previous Signature: 07/08/2020 12:31:14 PM Version By: Lorrin Jackson Entered By: Lorrin Jackson on 07/08/2020 13:12:55 -------------------------------------------------------------------------------- Problem List Details Patient Name: Date of Service: Nancy LLO [name] 07/08/2020 12:30 PM Medical  Record Number: [id number] Patient Account Number: [id number] Date of Birth/Sex: Treating RN: [name] (50 y.o. [name] Primary Care Provider: [name], [name] Other Clinician: Referring Provider: Treating Provider/Extender: [name], [name] Weeks in Treatment: 96 Active Problems ICD-10 Encounter Code Description Active Date MDM Diagnosis L97.514 Non-pressure chronic ulcer of other part of right foot with necrosis of bone 09/03/2018 No Yes E10.621 Type 1 diabetes mellitus with foot ulcer 09/24/2018 No Yes L97.521 Non-pressure chronic ulcer of other part of left foot limited to breakdown of 06/24/2020 No Yes skin Inactive Problems ICD-10 Code Description Active Date Inactive Date M86.671 Other chronic osteomyelitis, right ankle and foot 09/03/2018 09/03/2018 L97.411 Non-pressure chronic ulcer of right heel and midfoot limited to breakdown of skin 09/17/2019 09/17/2019 L97.521 Non-pressure chronic ulcer of other part of left foot limited to breakdown of skin 04/20/2019 04/20/2019 L97.812 Non-pressure chronic ulcer of other part of right lower leg with fat layer exposed 02/24/2019 02/24/2019 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 07/08/2020 12:30 PM Medical Record Number: [id number] Patient Account Number: [id number] Date of Birth/Sex: Treating RN: [name] (51 y.o. [name] Primary Care Provider: Other Clinician: [name], [name] Referring  Provider: Treating  Provider/Extender: Linton Ham MO REIRA, [name] Weeks in Treatment: 96 Subjective History of Present Illness (HPI) 50 year old diabetic who is known to have type 1 diabetes which is poorly controlled last hemoglobin A1c was 11%. She comes in with a ulcerated area on the left lateral foot which has been there for over 6 months. Was recently she has been treated by [name] of podiatry who saw her last on 05/28/2016. Review of his notes revealed that the patient had incision and drainage with placement of antibiotic beads to the left foot on 04/11/2016 for possible osteomyelitis of the cuboid bone. Over the last year she's had a history of amputation of the left fifth toe and a femoropopliteal popliteal bypass graft somewhere in April 2017. 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 His note [name] mentions that the patient has been referred to me for further wound care and possibly great candidate for hyperbaric oxygen therapy due to recurrent osteomyelitis. However we do not have any x-rays of biopsy reports confirming this. He has been on several antibiotics including Bactrim and most recently is on doxycycline for an MRSA. I understand, the patient was not a candidate for IV antibiotics as she has had previous PICC lines which resulted in blood clots in both arms. There was a x-ray report dated 04/04/2016 on [name] notes which showed evidence of fifth ray resection left foot with osteolytic changes noted to the fourth metatarsal and cuboid bone on the left. 06/13/2016 -- had a left foot x-ray which showed no acute fracture or dislocation and no definite radiographic evidence of osteomyelitis. Advanced osteopenia was seen. 06/20/2016 -- she has noticed a new wound on the right plantar foot in the region where she had a callus before. 06/27/16- the patient did have her x-ray of the right foot which showed no findings to suggest osteomyelitis. She saw  her endocrinologist, [name], yesterday. Her A1c in January was 11. He also indicates mismanagement and noncompliance regarding her diabetes. She is currently on Bactrim for a lip infection. She is complaining of nausea, vomiting and diarrhea. She is unable to articulate the exact orders or dosing of the Bactrim; it is unclear when she will complete this. 07/04/2016 -- results from [facility] of ABIs with ankle waveforms were noted from 02/14/2016. The examination done on 06/27/2015 showed noncompressible ABIs with the right being 1.45 and the left being 1.33. The present examination showed a right ABI of 1.19 on the left of 1.33. The conclusion was that right normal ABI in the lower extremity at rest however compared to previous study which was noncompressible ABI may be falsely elevated side suggesting medial calcification. The left ABI suggested medial calcification. 08/01/2016 -- the patient had more redness and pain on her right foot and did not get to come to see as noted she see her PCP or go to the ER and decided to take some leftover metronidazole which she had at home. As usual, the patient does report she feels and is rather noncompliant. 08/08/2016 -- -- x-ray of the right foot -- FINDINGS:Transmetatarsal amputation is noted. No bony destruction is noted to suggest osteomyelitis. IMPRESSION: No evidence of osteomyelitis. Postsurgical changes are seen. MRI would be more sensitive for possible bony changes. Culture has grown Serratia Marcescens -- sensitive to Bactrim, ciprofloxacin, ceftazidime she was seen by [name] on 08/06/2016. He did not find any exposed bone, muscle, tendon, ligament or joint. There was no malodor and he did a excisional debridement  in the office. ============ Old notes: 50 year old patient who is known to the wound clinic for a while had been away from the wound clinic since 09/01/2014. Over the last several months she has been admitted to various  hospitals including [facility] [place]. She was treated for a right metatarsal osteomyelitis with a transmetatarsal amputation and this was done about 2 months ago. He has a small ulcerated area on the right heel and she continues to have an ulcerated area on the left plantar aspect of the foot. The patient was recently admitted to the [facility] hospital group between 7/12 and 10/18/2014. she was given 3 weeks of IV vancomycin and was to follow-up with her surgeons at [facility] and also took oral vancomycin for C. difficile colitis. 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 MRI of the left foot done on 10/14/2014 showed no abscess or osteomyelitis. 04/27/15; this is a patient we know from previous stays in the wound care center. She is a type I diabetic I am not sure of her control currently. Since the last time I saw her she is had a right transmetatarsal amputation and has no wounds on her right foot and has no open wounds. She is been followed at the wound care center at [facility] in [place]. She comes today with the desire to undergo hyperbaric treatment locally. Apparently one of her wound care providers in [place] has suggested hyperbarics. This is in response to an MRI from 04/18/15 that showed increased marrow signal and loss of the proximal fifth metatarsal cortex evidence of osteomyelitis with likely early osteomyelitis in the cuboid bone as well. She has a large wound over the base of the fifth metatarsal. She also has a eschar over her the tips of her toes on 1,3 and 5. She does not have peripheral pulses and apparently is going for an angiogram tomorrow which seems reasonable. After this she is  going to infectious disease at [facility]. They have been using Medihoney to the large wound on the lateral aspect of the left foot to. The patient has known Charcot deformity from diabetic neuropathy. She also has known diabetic PAD. Surprisingly I can't see that she has had any recent antibiotics, the patient states the last antibiotic she had was at the end of Nancy for 10 days. I think this was in response to culture that showed group G strep although I'm not exactly sure where the culture was from. She is also had arterial studies on 03/29/15. This showed a right ABI of 1.4 that was noncompressible. Her left ABI was 0.73. 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 It was not felt that arterial inflow was adequate for healing of a foot ulcer. Her Doppler waveforms looked monophasic ===== READMISSION 02/28/17; this is in an now 50 year old woman we've had at several different occasions in this clinic. 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 She has a remote ex-smoker. She was last seen in this clinic by Dr. Con Memos I think in May. More recently she is been followed by her podiatrist [name] an infectious disease Dr. Megan Salon. She has 2 open wounds the major one is over the right first metatarsal head she also has a wound on the left  plantar foot. an MRI of the right foot on 01/01/17 showed a soft tissue ulcer along the plantar aspect of the first metatarsal base consistent with osteomyelitis of the first metatarsal stump. Dr. 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 According to the patient this is been open for slightly over a month. She has been on a combination of Cipro 500 twice a day, Zyvox 600 twice a day and Flagyl 500 3 times a day for over a month now as directed by Dr. Megan Salon. cultures of the right foot earlier this year showed MRSA in January  and Serratia in May. January also had a few viridans strep. Recent x-rays of both feet were done and [name] office and I don't have these reports. The patient has known PAD and has a history of aleft femoropopliteal bypass in April 2017. 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 Dwyane Dee. In spite of this the last hemoglobin A1c I can see is 10.1 on 01/01/2017. She is being referred by 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 She is been using Medihoney to this area and also an area on the left midfoot. She is using healing sandals bilaterally. ABIs in this clinic at the left posterior tibial was 1.1 noncompressible on the right READMISSION Non invasive vascular NOVANT 5/18 Aftercare following surgery of the circulatory system Procedure Note - Interface, External Ris In - 08/13/2016 11:05 AM EDT Procedure: Examination consists of physiologic resting arterial pressures of the brachial and ankle arteries bilaterally with continuous wave Doppler waveform analysis. Previous: Previous exam performed on 02/14/16 demonstrated ABIs of Rt = 1.19 and Lt = 1.33. Right: ABI = non-compressible PT 1.47 DP. S/P transmet amputation. , Left: ABI = 1.52, 2nd digit pressure = 87 mmHg Conclusions: Right: ABI (>1.3) may be falsely elevated, suggesting medial calcification. Left: ABI (>1.3) may be falsely elevated, suggesting medial calcification The patient is a now 50 year old type I diabetic is had multiple issues her graded to chronic diabetic foot ulcers. 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 We had her in the clinic lastin Nancy. At that point she had wounds on her bilateral  feet.she had wanted to try hyperbarics however the healogics review process denied her because she hadn't followed up with her vascular surgeon for her left femoropopliteal bypass. The bypass was done by Dr. Raul Del at [facility]. We made her a follow-up with Dr. Raul Del however she did not keep the appointment and therefore she was not approved The patient shows me a small wound on her left fourth metatarsal head on her phone. She developed rapid discoloration in the plantar aspect of the left foot and she was admitted to hospital from 2/2 through 05/10/17 with wet gangrene of the left foot osteomyelitis of the fourth metatarsal heads. She was admitted acutely ill with a temperature of 103. She was started on broad-spectrum vancomycin and cefepime. On 05/06/17 she was taken to the OR by [name] her podiatric surgeon for an incision and drainage irrigation of the left foot wound. Cultures from this surgery revealed group be strep and anaerobes. she was seen by Dr.Xu of orthopedic surgery and scheduled for a below-knee  amputation which she u refused. Ultimately she was discharged on Levaquin and Flagyl for one month. MRI 05/05/17 done while she was in the hospital showed abscess adjacent to the fourth metatarsal head and neck small abscess around the fourth flexor tendon. Inflammatory phlegmon and gas in the soft tissues along the lateral aspect of the fourth phalanx. Findings worrisome for osteomyelitis involving the fourth proximal and middle phalanx and also the third and fourth metatarsals. Finally the patient had actually shortly before this followed up with Dr. Raul Del at no time on 04/29/17. He felt that her left femoropopliteal bypass was patent he felt that her left-sided toe pressures more than adequate for healing a wound on the left foot. This was before her acute presentation. 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 05/28/17; she is started hyperbarics. The patient tells me that for some  reason she was not actually on Levaquin but I think on ciprofloxacin. She was on Flagyl. She only started her Levaquin yesterday due to some difficulty with the pharmacy and perhaps her sister picking it up. She has an appointment with [name] tomorrow and with infectious disease early next week. She has no new complaints 06/06/17; the patient continues in hyperbarics. She saw [name] on 05/29/17 who is her podiatric surgeon. He is elected for a transmetatarsal amputation on 06/27/17. I'm not sure at what level he plans to do this amputation. The patient is unaware ooShe also saw Dr. Megan Salon of infectious disease who elected to continue her on current antibiotics I think this is ciprofloxacin and Flagyl. I'll need to clarify with her tomorrow if she actually has this. We're using silver alginate to the actual wound. Necrotic surface today with material under the flap of her foot. ooOriginal MRI showed abscesses as well as osteomyelitis of the proximal and middle fourth phalanx and the third and fourth metatarsal heads 06/11/17; patient continues in hyperbarics and continues on oral antibiotics. She is doing well. The wound looks better. The necrotic part of this under the flap in her superior foot also looks better. she is been to see [name]. I haven't had a chance to look at his note. Apparently he has put the transmetatarsal amputation on hold her request it is still planning to take her to the OR for debridement and product application ACEL. I'll see if I can find his note. I'll therefore leave product ordering/requests to [name] for now. I was going to look at Dermagraft 06/18/17-she is here in follow-up evaluation for bilateral foot wounds. She continues with hyperbaric therapy. She states she has been applying manuka honey to the right plantar foot and alternate manuka honey and silver alginate to the left foot, despite our orders. We will continue with same treatment plan and she will  follo up next week. 06/25/17; I have reviewed [name] last note from 3/11. She has operative debridement in 2 days' time. By review his note apparently they're going to place there is skin over the majority of this wound which is a good choice. She has a small satellite area at the most proximal part of this wound on the left plantar foot. The area on the right plantar foot we've been using silver alginate and it is close to healing. 07/02/17; unfortunately the patient was not easily approved for [name] proposed surgery. I'm not completely certain what the issue is. She has been using silver alginate to the wound she has completed a first course of hyperbarics. She is still on Levaquin  and Flagyl. I have really lost track of the time course here.I suspect she should have another week to 2 of antibiotics. I'll need to see if she is followed up with infectious disease Dr. Megan Salon 07/09/17; the patient is followed up with Dr. Megan Salon. 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 She continues on Levaquin and metronidazole continuing both of these for now I think she is been on fr about 6 weeks. She still has some drainage but no pain. No fever. Her had been plans for her to go to the OR for operative debridement with her podiatrist [name], I am not exactly sure where that is. I'll probably slip a note to [name] today. I note that she follows with Dr. Dwyane Dee of endocrinology. We have her recertified for hyperbaric oxygen. I have not heard about Dermagraft however I'll see if [name] is planning a skin substitute as well 07/16/17; the patient tells me she is just about out of Tribbey. I'll need to check Dr. Hale Bogus last notes on this. She states she has plenty of Flagyl however. She comes in today complaining of pain in the right lateral foot which she said lasted for about a day. The wound on the right foot is actually much more medially. She also tells  me that the [facility] cost a lot of pain in the left foot wound and she turned back to silver alginate. Finally Dermagraft has a $258 per application co-pay. She cannot afford this 07/23/17; patient arrives today with the wound not much smaller. There is not much new to add. She has not heard from [name] all try to put in a call to them today. She was asking about Dermagraft again and she has an over $527 per application co-pay she states that she would be willing to try to do a payment plan. I been tried to avoid this. We've been using silver alginate, I'll change to [facility] 07/30/17-She is here in follow-up evaluation for left foot ulcer. She continues hyperbaric medicine. The left foot ulcer is stable we will continue with same treatment plan 08/06/17; she is here for evaluation of her left foot ulcer. Currently being treated for hyperbarics or underlying osteomyelitis. She is completed antibiotics. The left foot ulcer is better smaller with healthier looking granulation. For various reasons I am not really clear on we never got her back to the OR with [name]. He did not respond to my secure text message. Nevertheless I think that surgery on this point is not necessary nor am I completely clear that a skin substitute is necessary The patient is complaining about pain on the outside of her right foot. 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 There is no erythema. She also states the foot is warm versus her other part of her upper leg and this is largely true. It is not totally clear to me what's causing this. She thinks it's different from her usual neuropathy pain 08/13/17; she arrives in clinic today with a small wound which is superficial on her right first metatarsal head. 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 She tells [place] she was up on her feet over the Mother's Day celebration. ooThe large wound is on the left foot. Continues with hyperbarics  for underlying osteomyelitis. We're using Hydrofera Blue. She asked me today about where we were with Dermagraft. I had actually excluded this because of the co-pay however she  wants to assume this therefore I'll recheck the co-pay an order for next week. 08/20/17; the patient agreed to accept the co-pay of the first Apligraf which we applied today. She is disappointed she is finishing hyperbarics will run this through the insurance on the extent of the foot infection and the extent of the wound that she had however she is already had 60 dive's. Dermagraft No. 1 08/27/17; Dermagraft No. 2. She is not eligible for any more hyperbaric treatments this month. She reports a fair amount of drainage and she actually changed to the external dressings without disturbing the direct contact layer 09/03/17; the patient arrived in clinic today with the wound superficially looking quite healthy. Nice vibrant red tissue with some advancing epithelialization although not as much adherence of the flap as I might like. However she noted on her own fourth toe some bogginess and she brought that to our attention. Indeed this was boggy feeling like a possibility of subcutaneous fluid. 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 She is not been unwell. We've been using Dermagraft 09/10/17; the culture that I did not last week was MRSA. She saw Dr. Megan Salon this morning who is going to start her on vancomycin. I had sent him a secure a text message yesterday. I also spoke with her podiatric surgeon [name] about surgery on this foot the options for conserving a functional foot etc. Promised me he would see her and will make back consultation today. Paradoxically her actual wound on the plantar aspect of her left foot looks really quite good. I had given her 5 days worth of Baxdella to cover her for MRSA. Her MRI came back showing osteomyelitis within the  third metatarsal shaft and head and base of the third and fourth proximal phalanx. She had extensive inflammatory changes throughout the soft tissue of the lateral forefoot. With an ill-defined fluid around the fourth metatarsal extending into the plantar and dorsal soft tissues 09/19/17; the patient is actually on oral Septra and Flagyl. She apparently refused IV vancomycin. She also saw [name] at my request who is planning her for a left BKA sometime in mid July. MRI showed osteomyelitis within the third metatarsal shaft and head and the basis of the third and fourth proximal phalanx. I believe there was felt to be possible septic arthritis involving the third MTP. 09/26/17; the patient went back to Dr. Megan Salon at my suggestion and is now receiving IV daptomycin. Her wound continues to look quite good making the decision to proceed with a transmetatarsal amputation although more difficult for the patient. I believe in my extensive discussions with her she has a good sense of the pros and cons of this. I don't NV the tuft decision she has to make. She has an appointment with [name] I believe in mid July and I previously spoken to him about this issue Has we had used 3 previous Dermagraft. Given the condition of the wound surface I went ahead and added the fourth one today area and I did this not fully realizing that she'll be traveling to [place] next week. I'm hopeful she can come back in 2 weeks 10/21/17; Her same Dermagraft on for about 3-1/2 weeks. In spite of this the wound arrives looking quite healthy. There is been a lot of healing dimensions are smaller. Looking at the square shaped wound she has now there is some undermining and  some depth medially under the undermining although I cannot palpate any bone. No surrounding infection is obvious. She has difficult questions about how to look at this going forward vis--vis amputations versus continued medical therapy. T be truthful the  wound is looks so o healthy and it is continued to contract. Hard to justify foot surgery at this point although I still told her that I think it might come to that if we are not able to eradicate the underlying MRSA. She is still highly at risk and she understands this 11/06/17 on evaluation today patient appears to be doing better in regard to her foot ulcer. She's been tolerating the dressing changes without complication. Currently she is here for her Dermagraft #6. Her wound continues to make excellent progress at this point. She does not appear to have any evidence of infection which is good news. 11/13/17 on evaluation today patient appears to be doing excellent at this time. She is here for repeat Dermagraft application. This is #7. Overall her wound seems to be making great progress. 12/05/17; the patient arrives with the wound in much better condition than when I last saw this almost 6 weeks ago. She still has a small probing area in the left metatarsal head region on the lateral aspect of her foot. We applied her last Dermagraft today. ooSince the last time she is here she has what appears to a been a blood blister on the plantar aspect of left foot although I don't see this is threatening. There is also a thick raised tissue on the right mid metatarsal head region. This was not there I don't think the last time she was here 3 weeks ago. 12/12/17; the patient continues to have a small programming area in the left metatarsal head region on the lateral aspect of her foot which was the initial large surgical wound. I applied her last Apligraf last week. I'm going to use Endoform starting today ooUnfortunately she has an excoriated area in the left mid foot and the right mid foot. The left midfoot looks like a blistered area this was not opened last week it certainly is open today. Using silver alginate on these areas. She promises me she is offloading this. 12/19/17; the small probing area in the  left metatarsal head eyes think is shallower. In general her original wound looks better. We've been using Endoform. The area inferiorly that I think was trauma last week still requires debridement a lot of nonviable surface which I removed. She still has an open open area distally in her foot ooSimilarly on the right foot there is tightly adherent surface debris which I removed. Still areas that don't look completely epithelialized. This is a small open area. We used silver alginate on these areas 12/26/2017; the patient did not have the supplies we ordered from last week including the Endoform. The original large wound on the left lateral foot looks healthy. She still has the undermining area that is largely unchanged from last week. She has the same heavily callused raised edged wounds on the right mid and left midfoot. Both of these requiring debridement. We have been using silver alginate on these areas 01/02/2018; there is still supply issues. We are going to try to use Prisma but I am not sure she actually got it from what she is saying. She has a new open area on the lateral aspect of the left fourth toe [previous fifth ray amputation]. Still the one tunneling area over the fourth metatarsal head.  The area is in the midfoot bilaterally still have thick callus around them. She is concerned about a raised swelling on the lateral aspect of the foot. However she is completely insensate 01/10/2018; we are using Prisma to the wounds on her bilateral feet. Surprisingly the tunneling area over the left fourth metatarsal head that was part of her original surgery has closed down. She has a small open area remaining on the incision line. 2 open areas in the midfoot. 02/10/2018; the patient arrives back in clinic after a month hiatus. She was traveling to visit family in [place]. Is fairly clear she was not offloading the areas on her feet. The original wound over the left lateral foot at the level of  metatarsal heads is reopened and probes medially by about a centimeter or 2. She notes that a week ago she had purulent drainage come out of an area on the left midfoot. Paradoxically the worst area is actually on the right foot is extensive with purulent drainage. We will use silver alginate today 02/17/2018; the patient has 3 wounds one over the left lateral foot. She still has a small area over the metatarsal heads which is the remnant of her original surgical wound. This has medial probing depth of roughly 1.4 cm somewhat better than last week. The area on the right foot is larger. We have been using silver alginate to all areas. The area on the right foot and left foot that we cultured last week showed both Klebsiella and Proteus. Both of these are quinolone sensitive. The patient put her's self on Bactrim and Flagyl that she had left hanging around from prior antibiotic usages. She was apparently on this last week when she arrived. I did not realize this. Unfortunately the Bactrim will not cover either 1 of these organisms. We will send in Cipro 500 twice daily for a week 03/04/2018; the patient has 2 wounds on the left foot one is the original wound which was a surgical wound for a deep DFU. At one point this had exposed bone. She still has an area over the fourth metatarsal head that probes about 1.4 cm although I think this is better than last week. I been using silver nitrate to try and promote tissue adherence and been using silver alginate here. ooShe also has an area in the left midfoot. This has some depth but a small linear wound. Still requiring debridement. ooOn the right midfoot is a circular wound. A lot of thick callus around this area. ooWe have been using silver alginate to all wound areas ooShe is completed the ciprofloxacin I gave her 2 weeks ago. 03/11/2018; the patient continues to have 2 open areas on the left foot 1 of which was the original surgical wound for a deep  DFU. Only a small probing area remains although this is not much different from last week we have been using silver alginate. The other area is on the midfoot this is smaller linear but still with some depth. We have been using silver alginate here as well ooOn the right foot she has a small circular wound in the mid aspect. This is not much smaller than last time. We have been using silver alginate here as well 03/18/2018; she has 3 wounds on the left foot the original surgical wound, a very superficial wound in the mid aspect and then finally the area in the mid plantar foot. She arrives in today with a very concerning area in the wound in the mid  plantar foot which is her most proximal wound. There is undermining here of roughly 1-1/2 cm superiorly. Serosanguineous drainage. She tells me she had some pain on for over the weekend that shot up her foot into her thigh and she tells me that she had a nodule in the groin area. ooShe has the single wound in the right foot. ooWe are using endoform to both wound areas 03/24/2018; the patient arrives with the original surgical wound in the area on the left midfoot about the same as last week. There is a collection of fluid under the surface of the skin extending from the surgical wound towards the midfoot although it does not reach the midfoot wound. The area on the right foot is about the same. Cultures from last week of the left midfoot wound showed abundant Klebsiella abundant Enterococcus faecalis and moderate methicillin resistant staph I gave her Levaquin but this would have only covered the Klebsiella. She will need linezolid 04/01/2018; she is taking linezolid but for the first few days only took 1 a day. I have advised her to finish this at twice daily dosing. In any case all of her wounds are a lot better especially on the left foot. The original surgical wound is closed. The area on the left midfoot considerably smaller. The area on the  right foot also smaller. 04/08/2018; her original surgical wound/osteomyelitis on the left foot remains closed. She has area on the left foot that is in the midfoot area but she had some streaking towards this. This is not connected with her original wound at least not visually. ooSmall wound on the right midfoot appears somewhat smaller. 04/15/18; both wounds looks better. Original wound is better left midfoot. Using silver alginate 1/21; patient states she uses saltwater soak in, stones or remove callus from around her wounds. She is also concerned about a blood blister she had on the left foot but it simply resolved on its own. We've been using silver alginate 1/28; the patient arrives today with the same streaking area from her metatarsals laterally [the site of her original surgical wound] down to the middle of her foot. There is some drainage in the subcutaneous area here. This concerns me that there is actually continued ongoing infection in the metatarsals probably the fourth and third. This fixates an MRI of the foot without contrast [chronic renal failure] ooThe wound in the mid part of the foot is small but I wonder whether this area actually connects with the more distal foot. ooThe area on the right midfoot is probably about the same. Callus thick skin around the small wound which I removed with a curette we have been using silver alginate on both wound areas 2/4; culture I did of the draining site on the left foot last time grew methicillin sensitive staph aureus. MRI of the left foot showed interval resolution of the findings surrounding the third metatarsal joint on the prior study consistent with treated osteomyelitis. Chronic soft tissue ulceration in the plantar and lateral aspect of the forefoot without residual focal fluid collection. No evidence of recurrent osteomyelitis. 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 She tells me she had nausea and vomiting over the weekend and missed 2 days. In general her foot looks  much the same. She has a small open area just below the left fourth metatarsal head. A linear area in the left midfoot. Some discoloration extending from the inferior part of this into the left lateral foot although this appears to be superficial. She has a small area on the right midfoot which generally looks smaller after debridement 2/18; the patient is completing his cephalexin and has another 2 days. She continues to have open areas on the left and right foot. 2/25; she is now off antibiotics. The area on the left foot at the site of her original surgical wound has closed yet again. She still has open areas in the mid part of her foot however these appear smaller. The area on the right mid foot looks about the same. We have been using silver alginate She tells me she had a serious hypoglycemic spell at home. She had to have EMS called and get IV dextrose 3/3; disappointing on the left lateral foot large area of necrotic tissue surrounding the linear area. This appears to track up towards the same original surgical wound. Required extensive debridement. The area on the right plantar foot is not a lot better also using silver 3/12; the culture I did last time showed abundant enterococcus. I have prescribed Augmentin, should cover any unrecognized anaerobes as well. In addition there were a few MRSA and Serratia that would not be well covered although I did not want to give her multiple antibiotics. She comes in today with a new wound in the right midfoot this is not connected with the original wound over her MTP a lot of thick callus tissue around both wounds but once again she said she is not walking on these  areas 3/17-Patient comes in for follow-up on the bilateral plantar wounds, the right midfoot and the left plantar wound. Both these are heavily callused surrounding the wounds. We are continuing to use silver alginate, she is compliant with offloading and states she uses a wheelchair fairly often at home 3/24; both wound areas have thick callus. However things actually look quite a bit better here for the majority of her left foot and the right foot. 3/31; patient continues to have thick callused somewhat irritated looking tissue around the wounds which individually are fairly superficial. There is no evidence of surrounding infection. We have been using silver alginate however I change that to [facility] today 4/17; patient returns to clinic after having a scare with Covid she tested negative in her primary doctor's office. She has been using Hydrofera Blue. She does not have an open area on the right foot. On the left foot she has a small open area with the mid area not completely viable. She showed me pictures of what looks like a hemorrhagic blister from several days ago but that seems to have healed over this was on the lateral left foot 4/21; patient comes in to clinic with both her wounds on her feet closed. However over the weekend she started having pain in her right foot and leg up into the thigh. She felt as though she was running a low-grade fever but did not take her temperature. She took a doxycycline that she had leftover and yesterday a single Septra and metronidazole. She thinks things feel somewhat better. 4/28; duplex ultrasound I ordered last week was negative for DVT or superficial thrombophlebitis. She is completed the doxycycline I gave her. States she is still having a lot of pain in the right calf and right ankle  which is no better than last week. She cannot sleep. She also states she has a temperature of up to 101, coughing and complaining of visual loss in her bilateral  eyes. Apparently she was tested for Covid 2 weeks ago at [facility] and that was negative. Readmission: 09/03/18 patient presents back for reevaluation after having been evaluated at the end of April regarding erythema and swelling of her right lower extremity. Subsequently she ended up going to the hospital on 07/29/18 and was admitted not to be discharged until 08/08/18. Unfortunately it was noted during the time that she was in the hospital that she did have methicillin-resistant Staphylococcus aureus as the infection noted at the site. It was also determined that she did have osteomyelitis which appears to be fairly significant. She was treated with vancomycin and in fact is still on IV vancomycin at dialysis currently. This is actually slated to continue until 09/12/18 at least which will be the completion of the six weeks of therapy. Nonetheless based on what I'm seeing at this point I'm not sure she will be anywhere near ready to discontinue antibiotics at that time. Since she was released from the hospital she was seen by [name] who is her podiatrist on 08/27/18. His note specifically states that he is recommended that the patient needs of one knee amputation on the right as she has a life- threatening situation that can lead quickly to sepsis. The patient advised she would like to try to save her leg to which [name] apparently told her that this was against all medical advice. She also want to discontinue the Wound VAC which had been initiated due to the fact that she wasn't pleased with how the wound was looking and subsequently she wanted to pursue applying Medihoney at that time. He stated that he did not believe that the right lower extremity was salvageable and that the patient understood but would still like to attempt hyperbaric option therapy if it could be of any benefit. She was therefore referred back to [place] for further evaluation. He plans to see her back next week. Upon inspection today  patient has a significant amount purulent drainage noted from the wound at this point. The bone in the distal portion of her foot also appears to be extremely necrotic and spongy. When I push down on the bone it bubbles and seeps purulent drainage from deeper in the end of the foot. I do not think that this is likely going to heal very well at all and less aggressive surgical debridement were undertaken more than what I believe we can likely do here in our office. 09/12/2018; I have not seen this patient since the most recent hospitalization although she was in our clinic last week. I have reviewed some of her records from a complex hospitalization. 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 There is situation was complicated by MRSA bacteremia and acute on chronic renal failure now on dialysis. She is receiving vancomycin at dialysis. We started her on Dakin's wet-to-dry last week she is changing this daily. There is still purulent drainage coming out of her foot. Although she is apparently "agreeable" to a below-knee amputation which is been suggested by multiple clinicians she wants this to be done in [place]. She apparently has a telehealth visit with that provider sometime in late [place] 6/24. I have told her I think this is probably too long. Nevertheless I could not convince her to  allow a local doctor to perform BKA. 09/19/2018; the patient has a large necrotic area on the right anterior foot. She has had previous transmetatarsal amputations. Culture I did last week showed MRSA nothing else she is on vancomycin at dialysis. She has continued leaking purulent drainage out of the distal part of the large circular wound on the right anterior foot. She apparently went to see Dr. 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 Somehow that translated into her being referred to plastic surgery for debridement of the area. I  gather she basically refused amputation although I do not have a copy of Dr. Berenice Primas notes. The patient really wants to have a trial of hyperbaric oxygen. I agreed with initial assessment in this clinic that this was probably too far along to benefit however if she is going to have plastic surgery I think she would benefit from ancillary hyperbaric oxygen. The issue here is that the patient has benefited as maximally as any patient I have ever seen from hyperbaric oxygen therapy. Most recently she had exposed bone on the lateral part of her left foot after a surgical procedure and that actually has closed. She has eschared areas in both heels but no open area. She is remained systemically well. I am not optimistic that anything can be done about this but the patient is very clear that she wants an attempt. The attempt would include a wound VAC further debridements and hyperbaric oxygen along with IV antibiotics. 6/26; I put her in for a trial of hyperbaric oxygen only because of the dramatic response she has had with wounds on her left midfoot earlier this year which was a surgical wound that went straight to her bone over the metatarsal heads and also remotely the left third toe. We will see if we can get this through our review process and insurance. She arrives in clinic with again purulent material pouring out of necrotic bone on the top of the foot distally. There is also some concerning erythema on the front of the leg that we marked. It is bit difficult to tell how tender this is because of neuropathy. I note from infectious disease that she had her vancomycin extended. 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 She had the wound VAC on for part of the week. The rest of the time she is putting various things on this including Medihoney, "ionized water" silver sorb gel etc. 7/7; follow-up along with HBO. She is still on vancomycin at dialysis. She has a large open  area on the dorsal right foot and a small dark eschar area on her heel. There is a lot less erythema in the area and a lot less tenderness. From an infection point of view I think this is better. She still has a lot of necrosis in the remaining right forefoot [previous TMA] we are still using the wound VAC in this area 7/16; follow-up along with HBO. I put her on linezolid after she finished her vancomycin. We started this last Friday I gave her 2 weeks worth. I had the expectation that she would be operatively debrided by Dr. Marla Roe but that still has not happened yet. Patient phoned the office this week. She arrives for review today after HBO. The distal part of this wound is completely necrotic. Nonviable pieces of tendon bone was still purulent drainage. Also concerning that she has black eschar over the heel that is expanding. I think this may be  indicative of infection in this area as well. She has less erythema and warmth in the ankle and calf but still an abnormal exam 7/21 follow-up along with HBO. I will renew her linezolid after checking a CBC with differential monitoring her blood counts especially her platelets. She was supposed to have surgery yesterday but if I am reading things correctly this was canceled after her blood sugar was found to be over 500. I thought Dr. Marla Roe who called me said that they were sending her to the ER but the patient states that was not the case. 7/28. Follow-up along with HBO. She is on linezolid I still do not have any lab work from dialysis even though I called last week. The patient is concerned about an area on her left lateral foot about the level of the base of her fifth metatarsal. I did not really see anything that ominous here however this patient is in [place] ability to point out problems that she is sensing and she has been accurate in the past Finally she received a call from Dr. Marla Roe who is referring her to another orthopedic  surgeon stating that she is too booked up to take her to the operating room now. Was still using a wound VAC on the foot 8/3 -Follow-up after HBO, she is got another week of linezolid, she is to call ID for an appointment, x-rays of both feet were reviewed, the left foot x-ray with third MTP joint osteo- Right foot x-ray widespread osteo-in the right midfoot Right ankle x-ray does not show any active evidence of infection 8/11-Patient is seen after HBO, the wounds on the right foot appear to be about the same, the heel wound had some necrotic base over tendon that was debrided with a curette 8/21; patient is seen after HBO. The patient's wound on her dorsal foot actually looks reasonably good and there is substantial amount of epithelialization however the open area distally still has a lot of necrotic debris partially bone. I cannot really get a good sense of just how deep this probes under the foot. She has been pressuring me this week to order medical maggots through a company in [place] for her. The problem I have is there is not a defined wound area here. On the positive side there is no purulence. She has been to see infectious disease she is still on Septra DS although I have not had a chance to review their notes 8/28; patient is seen in conjunction with HBO. The wounds on her foot continued to improve including the right dorsal foot substantially the, the distal part of this wound and the area on the right heel. We have been using a wound VAC over this chronically. She is still on trimethoprim as directed by infectious disease 9/4; patient is seen in conjunction with HBO. Right dorsal foot wound substantially anteriorly is better however she continues to have a deep wound in the distal part of this that is not responding. We have been using silver collagen under border foam ooArea on the right plantar medial heel seems better. We have been using Hydrofera Blue 12/12/18 on evaluation  today patient appears to be doing about the same with regard to her wound based on prior measurements. She does have some necrotic tissue noted on the lateral aspect of the wound that is going require a little bit of sharp debridement today. This includes what appears to be potentially either severely necrotic bone or tendon. Nonetheless other than that she  does not appear to have any severe infection which is good news 9/18; it is been 2 weeks since I saw this wound. She is tolerating HBO well. Continued dramatic improvement in the area on the right dorsal foot. She still has a small wound on the heel that we have been using Hydrofera Blue. She continues with a wound VAC 9/24; patient has to be seen emergently today with a swelling on her right lateral lower leg. She says that she told Dr. Evette Doffing about this and also myself on a couple of occasions but I really have no recollection of this. She is not systemically unwell and her wound really looked good the last time I saw this. She showed this to providers at dialysis and she was able to verify that she was started on cephalexin today for 5 doses at dialysis. She dialyzes on Tuesday Thursday and Saturday. 10/2; patient is seen in conjunction with HBO. The area that is draining on the right anterior medial tibia is more extensive. Copious amounts of serosanguineous drainage with some purulence. We are still using the wound VAC on the original wound then it is stable. Culture I did of the original IandD showed MRSA I contacted dialysis she is now on vancomycin with dialysis treatments. I asked them to run a month 10/9; patient seen in conjunction with HBO. She had a new spontaneous open area just above the wound on the right medial tibia ankle. More swelling on the right medial tibia. Her wound on the foot looks about the same perhaps slightly better. There is no warmth spreading up her leg but no obvious erythema. her MRI of the foot and ankle and  distal tib-fib is not booked for next Friday I discussed this with her in great detail over multiple days. it is likely she has spreading infection upper leg at least involving the distal 25% above the ankle. She knows that if I refer her to orthopedics for infectious disease they are going to recommend amputation and indeed I am not against this myself. We had a good trial at trying to heal the foot which is what she wanted along with antibiotics debridement and HBO however she clearly has spreading infection [probably staph aureus/MRSA]. Nevertheless she once again tells me she wants to wait the left of the MRI. She still makes comments about having her amputation done in [place]. 10/19; arrives today with significant swelling on the lateral right leg. Last culture I did showed Klebsiella. Multidrug-resistant. Cipro was intermediate sensitivity and that is what I have her on pending her MRI which apparently is going to be done on Thursday this week although this seems to be moving back and forth. She is not systemically unwell. We are using silver alginate on her major wound area on the right medial foot and the draining areas on the right lateral lower leg 10/26; MRI showed extensive abscess in the anterior compartment of the right leg also widespread osteomyelitis involving osseous structures of the midfoot and portions of the hindfoot. Also suspicion for osteomyelitis anterior aspect of the distal medial malleolus. Culture I did of the purulence once again showed a multidrug-resistant Klebsiella. I have been in contact with nephrology late last week and she has been started on cefepime at dialysis to replace the vancomycin We sent a copy of her MRI report to Dr. Geroge Baseman in [place] who is an orthopedic surgeon. The patient takes great stock in his opinion on this. She says she will go to [place] to  have her leg amputated if Dr. Geroge Baseman does not feel there is any salvage options. 11/2; she  still is not talk to her orthopedic surgeon in [place]. Apparently he will call her at [phone] this afternoon. The quality of this is she has not allowed me to refer her anywhere. She has been told over and over that she needs this amputated but has not agreed to be referred. She tells me her blood sugar was 600 last night but she has not been febrile. 11/9; she never did got a call from the orthopedic surgeon in [place] therefore that is off the radar. We have arranged to get her see orthopedic surgery at [facility]. She still has a lot of draining purulence coming out of the new abscess in her right leg although that probably came from the osteomyelitis in her right foot and heel. Meanwhile the original wound on the right foot looks very healthy. Continued improvement. The issue is that the last MRI showed osteomyelitis in her right foot extensively she now has an abscess in the right anterior lower leg. There is nobody in [place] who will offer this woman anything but an amputation and to be honest that is probably what she needs. I think she still wants to talk about limb salvage although at this point I just do not see that. She has completed her vancomycin at dialysis which was for the original staph aureus she is still on cefepime for the more recent Klebsiella. She has had a long course of both of these antibiotics which should have benefited the osteomyelitis on the right foot as well as the abscess. 11/16; apparently [place] elective surgery is shut down because of COVID-19 pandemic. I have reached out to some contacts at [facility] to see if we can get her an orthopedic appointment there. I am concerned about continually leaving this but for the moment everything is static. In fact her original large wound on this foot is closing down. It is the abscess on the right anterior leg that continues to drain purulent serosanguineous material. She is not currently on any antibiotics however  she had a prolonged course of vancomycin [1 month] as well as cefepime for a month 02/24/2019 on evaluation today patient appears to be doing better than the last time I saw her. This is not a patient that I typically see. With that being said I am covering for Dr. Dellia Nims this week and again compared to when I last saw her overall the wounds in particular seem to be doing significantly better which is good news. With that being said the patient tells me several disconcerting things. She has not been able to get in to see anyone for potential debridement in regard to her leg wounds although she tells me that she does not think it is necessary any longer because she is taking care of that herself. She noticed a string coming out of the lower wound on her leg over the last week. The patient states that she subsequently decided that we must of pack something in there and started pulling the string out and as it kept coming and coming she realized this was likely her tendon. With that being said she continued to remove as much of this as she could. She then I subsequently proceeded to using tubes of antibiotic ointment which she will stick down into the wound and then scored as much as she can until she sees it coming out of the other wound opening. She states  that in doing this she is actually made things better and there is less redness and irritation. With regard to her foot wound she does have some necrotic tendon and tissue noted in one small corner but again the actual wound itself seems to be doing better with good granulation in general compared to my last evaluation. 12/7; continued improvement in the wound on the substantial part of the right medial foot. Still a necrotic area inferiorly that required debridement but the rest of this looks very healthy and is contracting. She has 2 wounds on the right lateral leg which were her original drainage sites from her abscess but all of this looks a lot  better as well. She has been using silver alginate after putting antibiotic biotic ointment in one wound and watching it come out the other. I have talked to her in some detail today. I had given her names of orthopedic surgeons at [facility] for second opinion on what to do about the right leg. I do not think the patient never called them. She has not been able to get a hold of the orthopedic surgeon in [place] that she had put a lot of faith in as being somebody would give her an opinion that she would trust. I talked to her today and said even if I could get her in to another orthopedic surgeon about the leg which she accept an amputation and she said she would not therefore I am not going to press this issue for the moment 12/14; continued improvement in his substantial wound on the right medial foot. There is still a necrotic area inferiorly with tightly adherent necrotic debris which I have been working on debriding each time she is here. She does not have an orthopedic appointment. Since last time she was here I looked over her cultures which were essentially MRSA on the foot wound and gram-negative rods in the abscess on the anterior leg. 12/21; continued improvement in the area on the right medial foot. She is not up on this much and that is probably a good thing since I do not know it could support continuous ambulation. She has a small area on the right lateral leg which were remanence of the IandD's I did because of the abscess. I think she should probably have prophylactic antibiotics I am going to have to look this over to see if we can make an intelligent decision here. In the meantime her major wound is come down nicely. Necrotic area inferiorly is still there but looks a lot better 04/06/2019; she has had some improvement in the overall surface area on the right medial foot somewhat narrowedr both but somewhat longer. The areas on the right lateral leg which were initial IandD sites  are superficial. Nothing is present on the right heel. We are using silver alginate to the wound areas 1/18; right medial foot somewhat smaller. Still a deep probing area in the most distal recess of the wound. She has nothing open on the right leg. She has a new wound on the plantar aspect of her left fourth toe which may have come from just pulling skin. The patient using Medihoney on the wound on her foot under silver alginate. I cannot discourage her from this 2/1; 2-week follow-up using silver alginate on the right foot and her left fourth toe. The area on the right dorsal foot is contracted although there is still the deep area in the most distal part of the wound but still has  some probing depth. No overt infection 2/15; 2-week follow-up. She continues to have improvement in the surface area on the dorsal right foot. Even the tunneling area from last time is almost closed. The area that was on the plantar part of her left fourth toe over the PIP is indeed closed 3/1; 2-week follow-up. Continued improvement in surface area. The original divot that we have been debriding inferiorly I think has full epithelialization although the epithelialization is gone down into the wound with probably 4 mm of depth. Even under intense illumination I am unable to see anything open here. The remanence of the wound in this area actually look quite healthy. We have been using silver alginate 3/15; 2-week follow-up. Unfortunately not as good today. She has a comma shaped wound on the dorsal foot however the upper part of this is larger. Under illumination debris on the surface She also tells [place] that she was on her right leg 2 times in the last couple of weeks mostly to reach up for things above her head etc. She felt a sharp pain in the right leg which she thinks is somewhere from the ankle to the knee. The patient has neuropathy and is really uncertain. She cannot feel her foot so she does not think it was coming  from there 3/29; 2-week follow-up. Her wound measures smaller. Surface of the wound appears reasonable. She is using silver alginate with underlying Medihoney. She has home health. X-rays I did of her tib-fib last time were negative although it did show arterial calcification 4/12; 2-week follow-up. Her wound measures smaller in length. Using manuka honey with silver alginate on top. She has home health. 4/26; 2-week follow-up. Her wound is smaller but still very adherent debris under illumination requiring debridement she has been using manuka honey with silver alginate. 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 Been using Hydrofera Blue. She is on suppressive Bactrim but apparently she has been taking it twice daily 6/7; I have not seen this wound and about 6 weeks. Since then she was up in [place]. By her own admission she was walking on the foot because she did not have a wheelchair. The wound is not nearly as healthy looking as it was the last time I saw this. We ordered different things for her but she only uses Medihoney and silver alginate. As far as I know she is on suppressive trimethoprim sulfamethoxazole. She does not admit to any fever or chills. Her CBGs apparently are at baseline however she is saying that she feels some discomfort on the lateral part of her ankle I looked over her last inflammatory markers from the summer 2020 at which time she had a deeply necrotic infected wound in this area. On 11/10/2018 her sedimentation rate was 56 and C-reactive protein 9.9. This was 107 and 29 on 07/29/2018. 6/17; the patient had a necrotic wound the last time she was here on the right dorsal foot. After debridement I did a culture. This showed a very resistant ESBL Klebsiella as well as Enterococcus. Her x-ray of the foot which was done because of warmth and some discomfort showed bone  destruction within the carpal bones involving the navicular acute cuboid lateral middle cuneiforms but essentially unchanged from her prior study which was done on 10/29/2018. The findings were felt to represent chronic osteomyelitis. We did inflammatory markers on her. Her white  count was 5.25 sedimentation rate 16 and C-reactive protein at 11.1. Notable for the fact that in August 2020 her CRP was 9.9 and sedimentation rate 56. I have looked at her x-rays. It is true that the bone destruction is very impressive however the patient came into this clinic for the wound on her right foot with pieces of bone literally falling out anteriorly with purulent material. I am not exactly sure I could have expected anything different. She has not been systemically unwell no fever chills or blood sugars have been reasonable. 6/28; she arrives with a right heel closed. The substantial area on the right anterior foot looks healthy. Much better looking surface. I think we can change to [facility] seems to help this previously. She is getting her antibiotics at dialysis she should be just about finished 7/9; changed to [facility] last week. Surface wound looks satisfactory not much change in surface area however. She is going to [place] state next week this is usually a difficult thing for this patient follow-up will be for 2 weeks. 7/23; using Hydrofera Blue. She returns from her trip and the wound looks surprisingly good. Usually when this patient goes on trips she comes back with a lot of problems with the wound. She is saying that she sometimes feels an episodic "crunching" feeling on the lateral part of the foot. She is neuropathic and not feeling pain but wonders whether this could be a neuropathic dysesthesia. 11/13/19-Patient returns after 3 weeks, the wound itself is stable and patient states that there is nothing new going on she is on some extra anxiety medications and is resisting the temptation  to pick at the dry skin around the wound. 9/20; patient has not been here in over a month and I have not seen her in 2 months. The wound in terms of size I think is about the same. There is no exposed bone. She has a nonviable surface on this. She is supposed to be using [facility] however she is also been using some form of honey preparation as well as a silver-based dressings. I do not think she has any pattern to this. 10/4; 2-week follow-up. Patient has been using some form of spray which she says has honey and silver to purchase this online she has been covering it with gauze. In spite of this the wound actually looks quite good. The deeper divot distally appears to be close down. There is a rim of epithelialization. 10/18; 2-week follow-up. Patient has been using her Hydrofera Blue covered with her silver honey spray that she got online. 11/1; 2-week follow-up. She is using Hydrofera Blue with a silver honey spray. Wound bed is measuring smaller. She has noticed that her foot is warmer on the right. She is concerned about infection. For a long period of time I had her on prophylactic trimethoprim sulfamethoxazole DS 1 tablet daily. She is asking for this to be restarted. The patient is walking on this foot because of repairs that are being done in a home her but her room is on the second floor she has to go up and down stairs. I have cautioned against this however as usual she will do exactly what she wants to do 11/15; 2-week follow-up. She uses Hydrofera Blue with a silver/honey spray which I have never heard of. I think her wound looks about the same. Some epithelialization. No evidence that this is infected. I think she is walking on this more than we agreed on. She  is going on extensive vacation over Thanksgiving 12/3; 2-week follow-up. She is using Hydrofera Blue however over Thanksgiving she ran out of this and she is simply been using Medihoney. In spite of this her wound is smaller  almost divided into 2 now. She traveled extensively over Thanksgiving and actually looks quite good in spite of this. Usually this is been a marker of problems for her 12/17; 2-week follow-up. She is using Hydrofera Blue. The wound is smaller. Debris on the surface of this is fibrinous. She is traveling to [place] over the holidays which never bodes well for her wounds. I think she is walking more on her feet then she is even willing to admit and she tells me she does walk 2/11; using Hydrofera Blue. Her wounds are contracting however she walks in the clinic with a history that she has not been able to eat she has had vomiting. She also has right eye problems. Her blood sugar was 567. She had blood work done at dialysis and we called there to get her blood work although it still had not returned although they should have this by the end of the day we were told. She also stated that she was not sure what her blood sugar was as her glucose monitor was not working and not coming to tomorrow. She also for some reason does not think her insulin pump is working well. Her endocrinologist is Dr. Elayne Snare 06/03/2020 upon evaluation today patient actually appears to be doing excellent in regard to her wound. In fact she tells me it was not even bleeding until she picked a dry piece of skin off while we were getting ready to come in and see her today. Fortunately there is no signs of active infection which is great news and overall very pleased. She is going to be seeing her neurologist sometime shortly I believe it might even be on Monday. Nonetheless there can proceed with the work-up as far as anything else going on with her eye the fact that her right eyelid is drooping. 3/25; right medial dorsal foot. She has superficial areas here that appear to be fully epithelialized she is using Medihoney and silver alginate and some combination. ooShe has a new area on the mid left foot at roughly the level of the  fifth metatarsal base 4/84/8; right medial dorsal foot. Still superficial areas here. I cleaned up 1 of these today she has been using I think mostly Medihoney but I would like her to use silver alginate. The area on the mid part of her foot also looks improved on the left. Since she was last here she tells me that she noted pus in the area of her left foot. She told dialysis about this they did a culture and ultimately she is on IV antibiotics but were not sure which one. They referred her to [name] he said that he did not need to follow her. I have not really verified any of this and I do not know what antibiotic she is on at dialysis Objective Constitutional Vitals Time Taken: 12:45 PM, Height: 67 in, Weight: 125 lbs, BMI: 19.6, Temperature: 98.4 F, Pulse: 87 bpm, Respiratory Rate: 16 breaths/min, Blood Pressure: 205/103 mmHg. General Notes: MD notified of elevated BP Integumentary (Hair, Skin) Wound #43 status is Open. Original cause of wound was Gradually Appeared. The date acquired was: 08/04/2018. The wound has been in treatment 96 weeks. The wound is located on the Right,Medial Foot. The wound measures 1cm  length x 1.2cm width x 0.1cm depth; 0.942cm^2 area and 0.094cm^3 volume. There is Fat Layer (Subcutaneous Tissue) exposed. There is no tunneling or undermining noted. There is a medium amount of serosanguineous drainage noted. The wound margin is flat and intact. There is large (67-100%) pink granulation within the wound bed. There is a small (1-33%) amount of necrotic tissue within the wound bed including Eschar. Wound #50 status is Open. Original cause of wound was Skin T ear/Laceration. The date acquired was: 06/24/2020. The wound has been in treatment 2 weeks. The wound is located on the [place]. The wound measures 0.4cm length x 0.3cm width x 0.1cm depth; 0.094cm^2 area and 0.009cm^3 volume. There is Fat Layer (Subcutaneous Tissue) exposed. There is no tunneling or  undermining noted. There is a medium amount of serosanguineous drainage noted. The wound margin is well defined and not attached to the wound base. There is large (67-100%) pink granulation within the wound bed. There is a small (1-33%) amount of necrotic tissue within the wound bed including Adherent Slough. 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 There was a Selective/Open Wound Skin/Dermis Debridement with a total area of 1.2 sq cm performed by Ricard Dillon., MD. With the following instrument(s): Curette to remove Non-Viable tissue/material. Material removed includes Skin: Dermis. No specimens were taken. A time out was conducted at 13:06, prior to the start of the procedure. A Minimum amount of bleeding was controlled with Pressure. The procedure was tolerated well. Post Debridement Measurements: 1cm length x 1.2cm width x 0.1cm depth; 0.094cm^3 volume. Character of Wound/Ulcer Post Debridement is stable. Severity of Tissue Post Debridement is: Fat layer exposed. Post procedure Diagnosis Wound #43: Same as Pre-Procedure Plan Follow-up Appointments: Return appointment in 3 weeks. Bathing/ Shower/ Hygiene: May shower and wash wound with soap and water. Edema Control - Lymphedema / SCD / Other: Elevate legs to the level of the heart or above for 30 minutes daily and/or when sitting, a frequency of: - throughout the day Avoid standing for long periods of time. Moisturize legs daily. - with dressing changes Off-Loading: Other: - minimal weight bearing right foot Home Health: No change in wound  care orders this week; continue Home Health for wound care. May utilize formulary equivalent dressing for wound treatment orders unless otherwise specified. Other Home Health Orders/Instructions: - Interim WOUND #43: - Foot Wound Laterality: Right, Medial Cleanser: Wound Cleanser (Home Health) Every Other Day/30 Days Discharge Instructions: Cleanse the wound with wound cleanser or normal saline prior to applying a clean dressing using gauze sponges, not tissue or cotton balls. Prim Dressing: KerraCel Ag Gelling Fiber Dressing, 2x2 in (silver alginate) (Home Health) Every Other Day/30 Days ary Discharge Instructions: Apply silver alginate to wound bed in clinic. pt may use silver honey spray at home Secondary Dressing: Woven Gauze Sponge, Non-Sterile 4x4 in [facility]) Every Other Day/30 Days Discharge Instructions: Apply over primary dressing as directed. Secured With: Elastic Bandage 4 inch (ACE bandage) (Home Health) Every Other Day/30 Days Discharge Instructions: Secure with ACE bandage as directed. Secured With: [employer], 4.5x3.1 (in/yd) [facility]) Every Other Day/30 Days Discharge Instructions: Secure with  Kerlix as directed. Secured With: Paper T ape, 2x10 (in/yd) (Home Health) Every Other Day/30 Days Discharge Instructions: Secure dressing with tape as directed. WOUND #50: - Foot Wound Laterality: Plantar, Left Cleanser: Wound Cleanser (Home Health) Every Other Day/30 Days Discharge Instructions: Cleanse the wound with wound cleanser or normal saline prior to applying a clean dressing using gauze sponges, not tissue or cotton balls. Prim Dressing: KerraCel Ag Gelling Fiber Dressing, 2x2 in (silver alginate) (Home Health) Every Other Day/30 Days ary Discharge Instructions: Apply silver alginate to wound bed in clinic. pt may use silver honey spray at home Secondary Dressing: Woven Gauze Sponge, Non-Sterile 4x4 in [facility]) Every Other Day/30 Days Discharge  Instructions: Apply over primary dressing as directed. Secured With: Elastic Bandage 4 inch (ACE bandage) (Home Health) Every Other Day/30 Days Discharge Instructions: Secure with ACE bandage as directed. Secured With: [employer], 4.5x3.1 (in/yd) [facility]) Every Other Day/30 Days Discharge Instructions: Secure with Kerlix as directed. Secured With: Paper T ape, 2x10 (in/yd) (Home Health) Every Other Day/30 Days Discharge Instructions: Secure dressing with tape as directed. 1. I have asked her to use silver alginate to the area on the right medial dorsal foot. This has some moisture to it 2. She can use Medihoney on the left plantar foot if she wants with silver alginate over the top. 3. Still on antibiotics at [facility] Electronic Signature(s) Signed: 07/08/2020 4:45:31 PM By: Linton Ham MD Entered By: Linton Ham on 07/08/2020 13:24:12 -------------------------------------------------------------------------------- SuperBill Details Patient Name: Date of Service: Nancy LLO [place] [name] 07/08/2020 Medical Record Number: [id number] Patient Account Number: [id number] Date of Birth/Sex: Treating RN: [birth year] (50 y.o. 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

## 2020-07-08 NOTE — Progress Notes (Addendum)
[name] ([id number]) Visit Report for 07/08/2020 Arrival Information Details Patient Name: Date of Service: [name] Nancy Ivory [name] 07/08/2020 12:30 PM Medical Record Number: [id number] Patient Account Number: [id number] Date of Birth/Sex: Treating RN: 01-05-1971 (50 [name].o. [name] Primary Care Diane Hanel: Gareth Eagle, [name] Other Clinician: Referring Shell Yandow: Treating Aastha Dayley/Extender: Londell Moh, [name] Weeks in Treatment: 96 Visit Information History Since Last Visit Added or deleted any medications: No Patient Arrived: Wheel Chair Any new allergies or adverse reactions: No Arrival Time: 12:45 Had a fall or experienced change in No Accompanied By: alone activities of daily living that may affect Transfer Assistance: None risk of falls: Patient Identification Verified: Yes Signs or symptoms of abuse/neglect since last visito No Secondary Verification Process Completed: Yes Hospitalized since last visit: No Patient Requires Transmission-Based Precautions: No Implantable device outside of the clinic excluding No Patient Has Alerts: No cellular tissue based products placed in the center since last visit: Has Dressing in Place as Prescribed: Yes Pain Present Now: No Electronic Signature(s) Signed: 07/11/2020 5:30:45 PM By: Zandra Abts RN, BSN Entered By: Zandra Abts on 07/08/2020 12:45:55 -------------------------------------------------------------------------------- Encounter Discharge Information Details Patient Name: Date of Service: [name] [place] Nancy Ina [name] 07/08/2020 12:30 PM Medical Record Number: [id number] Patient Account Number: [id number] Date of Birth/Sex: Treating RN: December 07, 1970 (49 [name].o. [name] Primary Care Raliyah Montella: Gareth Eagle, [name] Other Clinician: Referring Amarilys Lyles: Treating Preston Garabedian/Extender: Londell Moh, [name] Weeks in Treatment: 50 Encounter Discharge Information Items Post Procedure  Vitals Discharge Condition: Stable Temperature (F): 98.4 Ambulatory Status: Wheelchair Pulse (bpm): 87 Discharge Destination: Home Respiratory Rate (breaths/min): 18 Transportation: Other Blood Pressure (mmHg): 205/103 Accompanied By: self Schedule Follow-up Appointment: Yes Clinical Summary of Care: Patient Declined Electronic Signature(s) Signed: 07/08/2020 5:00:24 PM By: Zenaida Deed RN, BSN Entered By: Zenaida Deed on 07/08/2020 13:32:25 -------------------------------------------------------------------------------- Lower Extremity Assessment Details Patient Name: Date of Service: [name] Nancy Ivory [name] 07/08/2020 12:30 PM Medical Record Number: [id number] Patient Account Number: [id number] Date of Birth/Sex: Treating RN: 19-Mar-1971 (49 [name].o. [name] Primary Care Dwayn Moravek: Gareth Eagle, [name] Other Clinician: Referring Kemonie Cutillo: Treating Crestina Strike/Extender: Londell Moh, [name] Weeks in Treatment: 96 Edema Assessment Assessed: [Left: No] [Right: No] Edema: [Left: No] [Right: No] Calf Left: Right: Point of Measurement: From Medial Instep 27 cm Ankle Left: Right: Point of Measurement: From Medial Instep 19 cm Vascular Assessment Pulses: Dorsalis Pedis Palpable: [Left:Yes] [Right:Yes] Electronic Signature(s) Signed: 07/11/2020 5:30:45 PM By: Zandra Abts RN, BSN Entered By: Zandra Abts on 07/08/2020 12:46:35 -------------------------------------------------------------------------------- Multi Wound Chart Details Patient Name: Date of Service: [name] Nancy Ivory [name] 07/08/2020 12:30 PM Medical Record Number: [id number] Patient Account Number: [id number] Date of Birth/Sex: Treating RN: 06-Mar-1971 (49 [name].o. [name] Primary Care Kanton Kamel: Gareth Eagle, [name] Other Clinician: Referring Rylee Huestis: Treating Aryannah Mohon/Extender: Londell Moh, [name] Weeks in Treatment: 96 Vital Signs Height(in): 67 Pulse(bpm):  87 Weight(lbs): 125 Blood Pressure(mmHg): 205/103 Body Mass Index(BMI): 20 Temperature(F): 98.4 Respiratory Rate(breaths/min): 16 Photos: [43:No Photos Right, Medial Foot] [50:No Photos Left, Plantar Foot] [N/A:N/A N/A] Wound Location: [43:Gradually Appeared] [50:Skin Tear/Laceration] [N/A:N/A] Wounding Event: [43:Diabetic Wound/Ulcer of the Lower] [50:Diabetic Wound/Ulcer of the Lower] [N/A:N/A] Primary Etiology: [43:Extremity Cataracts, Chronic sinus] [50:Extremity Cataracts, Chronic sinus] [N/A:N/A] Comorbid History: [43:problems/congestion, Anemia, Sleep Apnea, Deep Vein Thrombosis, Hypertension, Peripheral Arterial Disease, Type I Diabetes, Osteoarthritis, Osteomyelitis, Neuropathy, Seizure Disorder 08/04/2018] [50:problems/congestion, Anemia, Sleep  Apnea,  Deep Vein Thrombosis, Hypertension, Peripheral Arterial Disease, Type I Diabetes, Osteoarthritis, Osteomyelitis, Neuropathy, Seizure Disorder 06/24/2020] [N/A:N/A] Date Acquired: [43:96] [50:2] [N/A:N/A] Weeks of Treatment: [43:Open] [50:Open] [N/A:N/A] Wound Status: [43:Yes] [50:No] [N/A:N/A] Clustered Wound: [43:1x1.2x0.1] [50:0.4x0.3x0.1] [N/A:N/A] Measurements L x W x D (cm) [27:0.623] [50:0.094] [N/A:N/A] A (cm) : rea [43:0.094] [50:0.009] [N/A:N/A] Volume (cm) : [43:98.70%] [50:52.00%] [N/A:N/A] % Reduction in A [43:rea: 99.60%] [50:76.90%] [N/A:N/A] % Reduction in Volume: [43:Grade 4] [50:Grade 1] [N/A:N/A] Classification: [43:Medium] [50:Medium] [N/A:N/A] Exudate A mount: [43:Serosanguineous] [50:Serosanguineous] [N/A:N/A] Exudate Type: [43:red, brown] [50:red, brown] [N/A:N/A] Exudate Color: [43:Flat and Intact] [50:Well defined, not attached] [N/A:N/A] Wound Margin: [43:Large (67-100%)] [50:Large (67-100%)] [N/A:N/A] Granulation A mount: [43:Pink] [50:Pink] [N/A:N/A] Granulation Quality: [43:Small (1-33%)] [50:Small (1-33%)] [N/A:N/A] Necrotic A mount: [43:Eschar] [50:Adherent Slough] [N/A:N/A] Necrotic  Tissue: [43:Fat Layer (Subcutaneous Tissue): Yes Fat Layer (Subcutaneous Tissue): Yes N/A] Exposed Structures: [43:Fascia: No Tendon: No Muscle: No Joint: No Bone: No Medium (34-66%)] [50:Fascia: No Tendon: No Muscle: No Joint: No Bone: No Medium (34-66%)] [N/A:N/A] Epithelialization: [43:Debridement - Selective/Open Wound N/A] [N/A:N/A] Debridement: Pre-procedure Verification/Time Out 13:06 [50:N/A] [N/A:N/A] Taken: [43:Skin/Dermis] [50:N/A] [N/A:N/A] Level: [43:1.2] [50:N/A] [N/A:N/A] Debridement A (sq cm): [43:rea Curette] [50:N/A] [N/A:N/A] Instrument: [43:Minimum] [50:N/A] [N/A:N/A] Bleeding: [43:Pressure] [50:N/A] [N/A:N/A] Hemostasis Achieved: [43:Procedure was tolerated well] [50:N/A] [N/A:N/A] Debridement Treatment Response: [43:1x1.2x0.1] [50:N/A] [N/A:N/A] Post Debridement Measurements L x W x D (cm) [43:0.094] [50:N/A] [N/A:N/A] Post Debridement Volume: (cm) [43:Debridement] [50:N/A] [N/A:N/A] Treatment Notes Electronic Signature(s) Signed: 07/08/2020 4:45:31 PM By: Linton Ham MD Signed: 07/08/2020 5:03:53 PM By: Lorrin Jackson Entered By: Linton Ham on 07/08/2020 13:22:13 -------------------------------------------------------------------------------- Multi-Disciplinary Care Plan Details Patient Name: Date of Service: [name] [address] [name] 07/08/2020 12:30 PM Medical Record Number: [id number] Patient Account Number: [id number] Date of Birth/Sex: Treating RN: [birth year] (25 [name].o. [name] Primary Care Yonael Tulloch: [name], [name] Other Clinician: Referring Jaasiel Hollyfield: Treating Ioane Bhola/Extender: [name], [name] Weeks in Treatment: 76 [place] reviewed with physician Active Inactive Nutrition Nursing Diagnoses: Imbalanced nutrition Impaired glucose control: actual or potential Potential for alteratiion in Nutrition/Potential for imbalanced nutrition Goals: Patient/caregiver agrees to and verbalizes understanding of  need to use nutritional supplements and/or vitamins as prescribed Date Initiated: 09/03/2018 Date Inactivated: 10/07/2018 Target Resolution Date: 10/01/2018 Goal Status: Met Patient/caregiver will maintain therapeutic glucose control Date Initiated: 09/03/2018 Target Resolution Date: 07/15/2020 Goal Status: Active Interventions: Provide education on elevated blood sugars and impact on wound healing Treatment Activities: Patient referred to Primary Care Physician for further nutritional evaluation : 09/03/2018 Notes: Wound/Skin Impairment Nursing Diagnoses: Impaired tissue integrity Knowledge deficit related to ulceration/compromised skin integrity Goals: Patient/caregiver will verbalize understanding of skin care regimen Date Initiated: 09/03/2018 Target Resolution Date: 07/15/2020 Goal Status: Active Ulcer/skin breakdown will have a volume reduction of 30% by week 4 Date Initiated: 09/03/2018 Date Inactivated: 10/07/2018 Target Resolution Date: 10/01/2018 Goal Status: Unmet Unmet Reason: COMORBITIES Ulcer/skin breakdown will have a volume reduction of 50% by week 8 Date Initiated: 10/07/2018 Date Inactivated: 11/03/2018 Target Resolution Date: 10/31/2018 Goal Status: Unmet Unmet Reason: Osteomyelitis Interventions: Assess patient/caregiver ability to obtain necessary supplies Assess patient/caregiver ability to perform ulcer/skin care regimen upon admission and as needed Assess ulceration(s) every visit Provide education on ulcer and skin care Treatment Activities: Skin care regimen initiated : 09/03/2018 Topical wound management initiated : 09/03/2018 Notes: Electronic Signature(s) Signed: 07/08/2020 12:31:30 PM By: Lorrin Jackson Entered By: Lorrin Jackson on 07/08/2020 12:31:28 -------------------------------------------------------------------------------- Pain Assessment Details Patient Name: Date of Service: [name] [place] [name], Nancy NNIE  L. 07/08/2020 12:30 PM Medical Record Number:  [id number] Patient Account Number: [id number] Date of Birth/Sex: Treating RN: 1970-10-16 (89 [name].o. [name] Primary Care Bodhi Moradi: [name], [name] Other Clinician: Referring Laelia Angelo: Treating Saraya Tirey/Extender: [name], [name] Weeks in Treatment: 96 Active Problems Location of Pain Severity and Description of Pain Patient Has Paino No Site Locations Pain Management and Medication Current Pain Management: Electronic Signature(s) Signed: 07/11/2020 5:30:45 PM By: [name] RN, BSN Entered By: [name] on 07/08/2020 12:46:27 -------------------------------------------------------------------------------- Patient/Caregiver Education Details Patient Name: Date of Service: [name] Nancy Pao [name] 4/8/2022andnbsp12:30 PM Medical Record Number: [id number] Patient Account Number: [id number] Date of Birth/Gender: Treating RN: Oct 16, 1970 (62 [name].o. [name] Primary Care Physician: [name], [name] Other Clinician: Referring Physician: Treating Physician/Extender: [name], [name] Weeks in Treatment: 70 Education Assessment Education Provided To: Patient Education Topics Provided Elevated Blood Sugar/ Impact on Healing: Methods: Explain/Verbal, Printed Responses: State content correctly Wound/Skin Impairment: Methods: Explain/Verbal, Printed Responses: State content correctly Electronic Signature(s) Signed: 07/08/2020 5:03:53 PM By: Lorrin Jackson Entered By: Lorrin Jackson on 07/08/2020 12:32:06 -------------------------------------------------------------------------------- Wound Assessment Details Patient Name: Date of Service: Nancy [name] 07/08/2020 12:30 PM Medical Record Number: [id number] Patient Account Number: [id number] Date of Birth/Sex: Treating RN: March 05, 1971 (37 [name].o. [name] Primary Care Fynn Vanblarcom: [name], [name] Other Clinician: Referring Kaleeya Hancock: Treating Rick Carruthers/Extender:  [name], [name] Weeks in Treatment: 96 Wound Status Wound Number: [id number] Primary Diabetic Wound/Ulcer of the Lower Extremity Etiology: Wound Location: Right, Medial Foot Wound Open Wounding Event: Gradually Appeared Status: Date Acquired: 08/04/2018 Comorbid Cataracts, Chronic sinus problems/congestion, Anemia, Sleep Weeks Of Treatment: 96 History: Apnea, Deep Vein Thrombosis, Hypertension, Peripheral Arterial Clustered Wound: Yes Disease, Type I Diabetes, Osteoarthritis, Osteomyelitis, Neuropathy, Seizure Disorder Photos Wound Measurements Length: (cm) 1 Width: (cm) 1.2 Depth: (cm) 0.1 Area: (cm) 0.942 Volume: (cm) 0.094 % Reduction in Area: 98.7% % Reduction in Volume: 99.6% Epithelialization: Medium (34-66%) Tunneling: No Undermining: No Wound Description Classification: Grade 4 Wound Margin: Flat and Intact Exudate Amount: Medium Exudate Type: Serosanguineous Exudate Color: red, brown Foul Odor After Cleansing: No Slough/Fibrino No Wound Bed Granulation Amount: Large (67-100%) Exposed Structure Granulation Quality: Pink Fascia Exposed: No Necrotic Amount: Small (1-33%) Fat Layer (Subcutaneous Tissue) Exposed: Yes Necrotic Quality: Eschar Tendon Exposed: No Muscle Exposed: No Joint Exposed: No Bone Exposed: No Treatment Notes Wound #43 (Foot) Wound Laterality: Right, Medial Cleanser Wound Cleanser Discharge Instruction: Cleanse the wound with wound cleanser or normal saline prior to applying a clean dressing using gauze sponges, not tissue or cotton balls. Peri-Wound Care Topical Primary Dressing KerraCel Ag Gelling Fiber Dressing, 2x2 in (silver alginate) Discharge Instruction: Apply silver alginate to wound bed in clinic. pt may use silver honey spray at home Secondary Dressing Woven Gauze Sponge, Non-Sterile 4x4 in Discharge Instruction: Apply over primary dressing as directed. Secured With Elastic Bandage 4 inch (ACE  bandage) Discharge Instruction: Secure with ACE bandage as directed. Kerlix Roll Sterile, 4.5x3.1 (in/yd) Discharge Instruction: Secure with Kerlix as directed. Paper Tape, 2x10 (in/yd) Discharge Instruction: Secure dressing with tape as directed. Compression Wrap Compression Stockings Add-Ons Electronic Signature(s) Signed: 07/11/2020 7:58:59 AM By: Sandre Kitty Signed: 07/11/2020 5:30:45 PM By: [name] RN, BSN Entered By: Sandre Kitty on 07/08/2020 16:55:59 -------------------------------------------------------------------------------- Wound Assessment Details Patient Name: Date of Service: [name] Nancy Pao [name] 07/08/2020 12:30 PM Medical Record Number: [id number] Patient Account Number: [id number]  Date of Birth/Sex: Treating RN: [birth year] (89 [name].o. [name] Primary Care Makynna Manocchio: [name], [name] Other Clinician: Referring Merrie Epler: Treating Turner Baillie/Extender: [name], [name] Weeks in Treatment: 96 Wound Status Wound Number: 71 Primary Diabetic Wound/Ulcer of the Lower Extremity Etiology: Wound Location: Left, Plantar Foot Wound Open Wounding Event: Skin Tear/Laceration Status: Date Acquired: 06/24/2020 Comorbid Cataracts, Chronic sinus problems/congestion, Anemia, Sleep Weeks Of Treatment: 2 History: Apnea, Deep Vein Thrombosis, Hypertension, Peripheral Arterial Clustered Wound: No Disease, Type I Diabetes, Osteoarthritis, Osteomyelitis, Neuropathy, Seizure Disorder Photos Wound Measurements Length: (cm) 0.4 Width: (cm) 0.3 Depth: (cm) 0.1 Area: (cm) 0.094 Volume: (cm) 0.009 % Reduction in Area: 52% % Reduction in Volume: 76.9% Epithelialization: Medium (34-66%) Tunneling: No Undermining: No Wound Description Classification: Grade 1 Wound Margin: Well defined, not attached Exudate Amount: Medium Exudate Type: Serosanguineous Exudate Color: red, brown Foul Odor After Cleansing: No Slough/Fibrino Yes Wound  Bed Granulation Amount: Large (67-100%) Exposed Structure Granulation Quality: Pink Fascia Exposed: No Necrotic Amount: Small (1-33%) Fat Layer (Subcutaneous Tissue) Exposed: Yes Necrotic Quality: Adherent Slough Tendon Exposed: No Muscle Exposed: No Joint Exposed: No Bone Exposed: No Treatment Notes Wound #50 (Foot) Wound Laterality: Plantar, Left Cleanser Wound Cleanser Discharge Instruction: Cleanse the wound with wound cleanser or normal saline prior to applying a clean dressing using gauze sponges, not tissue or cotton balls. Peri-Wound Care Topical Primary Dressing KerraCel Ag Gelling Fiber Dressing, 2x2 in (silver alginate) Discharge Instruction: Apply silver alginate to wound bed in clinic. pt may use silver honey spray at home Secondary Dressing Woven Gauze Sponge, Non-Sterile 4x4 in Discharge Instruction: Apply over primary dressing as directed. Secured With Elastic Bandage 4 inch (ACE bandage) Discharge Instruction: Secure with ACE bandage as directed. Kerlix Roll Sterile, 4.5x3.1 (in/yd) Discharge Instruction: Secure with Kerlix as directed. Paper Tape, 2x10 (in/yd) Discharge Instruction: Secure dressing with tape as directed. Compression Wrap Compression Stockings Add-Ons Electronic Signature(s) Signed: 07/11/2020 7:58:59 AM By: Sandre Kitty Signed: 07/11/2020 5:30:45 PM By: [name] RN, BSN Entered By: Sandre Kitty on 07/08/2020 16:56:29 -------------------------------------------------------------------------------- Vitals Details Patient Name: Date of Service: [name] Nancy Pao [name] 07/08/2020 12:30 PM Medical Record Number: [id number] Patient Account Number: [id number] Date of Birth/Sex: Treating RN: Sep 13, 1970 (56 [name].o. [name] Primary Care Helton Oleson: [name], [name] Other Clinician: Referring Debbie Bellucci: Treating Aston Lieske/Extender: [name], [name] Weeks in Treatment: 96 Vital Signs Time Taken:  12:45 Temperature (F): 98.4 Height (in): 67 Pulse (bpm): 87 Weight (lbs): 125 Respiratory Rate (breaths/min): 16 Body Mass Index (BMI): 19.6 Blood Pressure (mmHg): 205/103 Reference Range: 80 - 120 mg / dl Notes MD notified of elevated BP Electronic Signature(s) Signed: 07/11/2020 5:30:45 PM By: [name] RN, BSN Entered By: [name] on 07/08/2020 12:52:42

## 2020-07-13 ENCOUNTER — Emergency Department (HOSPITAL_BASED_OUTPATIENT_CLINIC_OR_DEPARTMENT_OTHER)
Admission: EM | Admit: 2020-07-13 | Discharge: 2020-07-13 | Disposition: A | Discharge status: Home / Self Care | Payer: Medicare HMO | Attending: Emergency Medicine | Admitting: Emergency Medicine

## 2020-07-13 ENCOUNTER — Encounter (HOSPITAL_BASED_OUTPATIENT_CLINIC_OR_DEPARTMENT_OTHER): Payer: Self-pay | Admitting: *Deleted

## 2020-07-13 ENCOUNTER — Other Ambulatory Visit: Payer: Self-pay

## 2020-07-13 DIAGNOSIS — R040 Epistaxis: Secondary | ICD-10-CM | POA: Insufficient documentation

## 2020-07-13 DIAGNOSIS — Z87891 Personal history of nicotine dependence: Secondary | ICD-10-CM | POA: Insufficient documentation

## 2020-07-13 DIAGNOSIS — I120 Hypertensive chronic kidney disease with stage 5 chronic kidney disease or end stage renal disease: Secondary | ICD-10-CM | POA: Insufficient documentation

## 2020-07-13 DIAGNOSIS — Z794 Long term (current) use of insulin: Secondary | ICD-10-CM | POA: Diagnosis not present

## 2020-07-13 DIAGNOSIS — Z992 Dependence on renal dialysis: Secondary | ICD-10-CM | POA: Diagnosis not present

## 2020-07-13 DIAGNOSIS — E1022 Type 1 diabetes mellitus with diabetic chronic kidney disease: Secondary | ICD-10-CM | POA: Diagnosis not present

## 2020-07-13 DIAGNOSIS — N186 End stage renal disease: Secondary | ICD-10-CM | POA: Diagnosis not present

## 2020-07-13 DIAGNOSIS — I10 Essential (primary) hypertension: Secondary | ICD-10-CM

## 2020-07-13 DIAGNOSIS — Z7982 Long term (current) use of aspirin: Secondary | ICD-10-CM | POA: Diagnosis not present

## 2020-07-13 DIAGNOSIS — Z79899 Other long term (current) drug therapy: Secondary | ICD-10-CM | POA: Diagnosis not present

## 2020-07-13 LAB — CBC WITH DIFFERENTIAL/PLATELET
Abs Immature Granulocytes: 0.04 10*3/uL (ref 0.00–0.07)
Basophils Absolute: 0 10*3/uL (ref 0.0–0.1)
Basophils Relative: 1 %
Eosinophils Absolute: 0.2 10*3/uL (ref 0.0–0.5)
Eosinophils Relative: 2 %
HCT: 33.1 % — ABNORMAL LOW (ref 36.0–46.0)
Hemoglobin: 10.9 g/dL — ABNORMAL LOW (ref 12.0–15.0)
Immature Granulocytes: 1 %
Lymphocytes Relative: 16 %
Lymphs Abs: 1.2 10*3/uL (ref 0.7–4.0)
MCH: 31.5 pg (ref 26.0–34.0)
MCHC: 32.9 g/dL (ref 30.0–36.0)
MCV: 95.7 fL (ref 80.0–100.0)
Monocytes Absolute: 0.6 10*3/uL (ref 0.1–1.0)
Monocytes Relative: 7 %
Neutro Abs: 5.5 10*3/uL (ref 1.7–7.7)
Neutrophils Relative %: 73 %
Platelets: 228 10*3/uL (ref 150–400)
RBC: 3.46 MIL/uL — ABNORMAL LOW (ref 3.87–5.11)
RDW: 14.3 % (ref 11.5–15.5)
WBC: 7.6 10*3/uL (ref 4.0–10.5)
nRBC: 0 % (ref 0.0–0.2)

## 2020-07-13 LAB — BASIC METABOLIC PANEL
Anion gap: 16 — ABNORMAL HIGH (ref 5–15)
BUN: 46 mg/dL — ABNORMAL HIGH (ref 6–20)
CO2: 24 mmol/L (ref 22–32)
Calcium: 9.2 mg/dL (ref 8.9–10.3)
Chloride: 93 mmol/L — ABNORMAL LOW (ref 98–111)
Creatinine, Ser: 4.48 mg/dL — ABNORMAL HIGH (ref 0.44–1.00)
GFR, Estimated: 11 mL/min — ABNORMAL LOW (ref >60)
Glucose, Bld: 125 mg/dL — ABNORMAL HIGH (ref 70–99)
Potassium: 4.4 mmol/L (ref 3.5–5.1)
Sodium: 133 mmol/L — ABNORMAL LOW (ref 135–145)

## 2020-07-13 LAB — CBG MONITORING, ED: Glucose-Capillary: 71 mg/dL (ref 70–99)

## 2020-07-13 MED ORDER — HYDRALAZINE HCL 20 MG/ML IJ SOLN
5.0000 mg | Freq: Once | INTRAMUSCULAR | Status: DC
  Filled 2020-07-13: qty 1

## 2020-07-13 MED ORDER — HYDRALAZINE HCL 20 MG/ML IJ SOLN
10.0000 mg | Freq: Once | INTRAMUSCULAR | Status: AC
  Administered 2020-07-13: 10 mg via INTRAVENOUS

## 2020-07-13 MED ORDER — OXYMETAZOLINE HCL 0.05 % NA SOLN
1.0000 | Freq: Once | NASAL | Status: AC
  Administered 2020-07-13: 1 via NASAL
  Filled 2020-07-13: qty 30

## 2020-07-13 NOTE — ED Triage Notes (Addendum)
C/o h/a and nosebleed today , also c/o blood sugar 66, states she lost her insulin pump today and  Gave herself 7 units of Humalog. Pt is a tue, thurs, sat dialysis pt

## 2020-07-13 NOTE — Discharge Instructions (Signed)
Please keep the Rhino Rocket in until you see ENT doctor in several days.  Continue dialysis as scheduled  Use Afrin as needed if you have some breakthrough bleeding  Return to ER if you have worse nosebleed, blood pressure over 200

## 2020-07-13 NOTE — ED Provider Notes (Signed)
[facility] Provider Note   CSN: [id number] Arrival date & time: 07/13/20  2137     History Chief Complaint  Patient presents with  . Epistaxis    [name] is a 50 y.o. female hx of CKD on HD (last HD yesterday), previous nosebleed, here presenting with epistaxis.  States that she had an episode of nosebleed today from the right nostril.  She states that she tried to put some pressure but it keeps on bleeding.  She also states that she is a dialysis patient her blood pressure is elevated at 200.  She did go to dialysis yesterday.  She states that she is on multiple BP meds and they usually need to give her multiple meds to keep her blood pressure from getting too high during dialysis.  Patient also states that her blood sugar is 66 and she lost her insulin pump and gave herself 7 units of Humalog.  She had previous nosebleed before.  She states that she required blood transfusion one time from nosebleed.  Patient is not on blood thinners  The history is provided by the patient.       Past Medical History:  Diagnosis Date  . Anemia   . Arthritis   . Bronchitis   . C. difficile diarrhea 09/26/2014  . Cataracts, both eyes    had surgery to remove  . Cellulitis of right foot 06/02/2014  . CKD (chronic kidney disease) stage 3, GFR 30-59 ml/min (HCC)    Dialysis T, TH, Sat  . Diabetic ulcer of right foot ([place]) 06/02/2014  . DVT (deep venous thrombosis) ([place]) 10/2014   one in each one in leg- PICC line , one in right and left arm  . H/O seasonal allergies   . History of blood transfusion   . Hypercholesteremia   . Hypertension   . Left foot infection   . Neuromuscular disorder ([place])    lower legs and feet  . Neuropathy in diabetes ([place])   . Peripheral vascular disease ([place])   . Sleep apnea    does not use cpap  . Staphylococcus aureus bacteremia 10/2014  . Type 1 diabetes [facility])    onset age 53    Patient Active Problem List   Diagnosis  Date Noted  . Precordial chest pain 04/27/2020  . Allergy, unspecified, initial encounter 11/26/2019  . Anaphylactic shock, unspecified, initial encounter 11/26/2019  . Encounter for removal of sutures 11/24/2019  . Chronic venous hypertension (idiopathic) with ulcer and inflammation of unspecified lower extremity ([place]) 09/08/2019  . ESRD (end stage renal disease) on dialysis ([place])   . Anemia in chronic kidney disease 08/04/2018  . Coagulation defect, unspecified ([place]) 08/04/2018  . Secondary hyperparathyroidism of renal origin ([place]) 08/04/2018  . Osteomyelitis of right foot ([place]) 07/29/2018  . Menorrhagia with irregular cycle 11/27/2017  . Gangrene of left foot ([place]) 05/08/2017  . Osteomyelitis of left foot ([place]) 05/08/2017  . Diabetic foot infection ([place])   . Diabetic foot ulcer ([place]) 05/05/2017  . Cellulitis 05/04/2017  . Diabetes mellitus type 1 with peripheral artery disease ([place]) 01/16/2017  . Anxiety 03/23/2016  . Compulsive skin picking 03/23/2016  . Sepsis due to methicillin resistant Staphylococcus aureus (MRSA) without acute organ dysfunction ([place]) 11/04/2015  . Heart murmur 11/03/2015  . Acute renal failure superimposed on stage 4 chronic kidney disease ([place]) 05/31/2015  . PAD (peripheral artery disease) ([place]) 04/22/2015  . Peripheral edema 12/28/2014  . Protein-calorie malnutrition, moderate ([place]) 10/18/2014  .  History of DVT (deep vein thrombosis)   . MRSA bacteremia   . Diabetic infection of left foot ([place]) 10/13/2014  . Chronic anemia 10/13/2014  . Status post transmetatarsal amputation of right foot ([place]) 09/26/2014  . Diabetes mellitus type 1, uncontrolled, insulin dependent ([place]) 11/09/2013  . DM type 1 causing renal disease ([place]) 06/13/2013  . CKD (chronic kidney disease), stage III ([place]) 06/12/2013  . Charcot foot due to diabetes mellitus ([place]) 05/09/2011  . Foot ulcer due to secondary DM ([place]) 05/09/2011  . Type 1 diabetes mellitus with neurological  manifestations, uncontrolled ([place])   . Hypertension   . Hypercholesteremia     Past Surgical History:  Procedure Laterality Date  . AMPUTATION TOE Left 07/24/2015   5th toe and Hallux amputation  . AV FISTULA PLACEMENT Left 08/06/2018   Procedure: INSERTION OF BASCILIC VEN TRANSPOSITION 1ST STAGE;  Surgeon: Rosetta Posner, MD;  Location: [facility];  Service: Vascular;  Laterality: Left;  . BASCILIC VEIN TRANSPOSITION Left 04/29/2019   Procedure: SECOND STAGE BASCILIC VEIN TRANSPOSITION LEFT ARM;  Surgeon: Rosetta Posner, MD;  Location: [facility];  Service: Vascular;  Laterality: Left;  . CESAREAN SECTION     x 1  . EXOSTECTECTOMY TOE Left 04/11/2016   Procedure: EXOSTECTECTOMY CUBOID AND 5TH METATARSAL AND PLACEMENT OF ANTIBIOTIC BEADS;  Surgeon: Edrick Kins, DPM;  Location: [place];  Service: Podiatry;  Laterality: Left;  . EYE SURGERY Bilateral    removed cataracts - laser  . FEMORAL-POPLITEAL BYPASS GRAFT Left 05/03/2015  . FOOT AMPUTATION THROUGH METATARSAL Right 2016  . hemrrhoidectomy    . I & D EXTREMITY Right 06/10/2014   Procedure: IRRIGATION AND DEBRIDEMENT Right Foot;  Surgeon: Newt Minion, MD;  Location: [facility];  Service: Orthopedics;  Laterality: Right;  . I & D EXTREMITY Right 07/31/2018   Procedure: IRRIGATION AND DEBRIDEMENT EXTREMITY;  Surgeon: Edrick Kins, DPM;  Location: [facility];  Service: Podiatry;  Laterality: Right;  . I & D EXTREMITY Right 08/04/2018   Procedure: IRRIGATION AND DEBRIDEMENT FOOT with placement of wound vac;  Surgeon: Edrick Kins, DPM;  Location: [place];  Service: Podiatry;  Laterality: Right;  . INCISION AND DRAINAGE Left 04/11/2016   Procedure: INCISION AND DRAINAGE A DEEP COMPLICATED WOUND OF LEFT FOOT;  Surgeon: Edrick Kins, DPM;  Location: [place];  Service: Podiatry;  Laterality: Left;  . INSERTION OF DIALYSIS CATHETER Right 08/06/2018   Procedure: INSERTION OF TUNNELED DIALYSIS CATHETER;  Surgeon: Rosetta Posner, MD;  Location: [place];  Service: Vascular;   Laterality: Right;  . IR FLUORO GUIDE CV LINE RIGHT  09/24/2017  . IR FLUORO GUIDE CV LINE RIGHT  08/01/2018  . IR REMOVAL TUN CV CATH W/O FL  11/22/2017  . IR US GUIDE VASC ACCESS RIGHT  09/24/2017  . IR US GUIDE VASC ACCESS RIGHT  08/01/2018  . IRRIGATION AND DEBRIDEMENT ABSCESS Left 05/06/2017   Procedure: IRRIGATION AND DEBRIDEMENT ABSCESS LEFT FOOT;  Surgeon: Edrick Kins, DPM;  Location: [facility];  Service: Podiatry;  Laterality: Left;  . PERIPHERAL VASCULAR BALLOON ANGIOPLASTY Left 01/20/2020   Procedure: PERIPHERAL VASCULAR BALLOON ANGIOPLASTY;  Surgeon: Marty Heck, MD;  Location: [facility];  Service: Cardiovascular;  Laterality: Left;  Arm fistula  . PERIPHERALLY INSERTED CENTRAL CATHETER INSERTION    . SKIN GRAFT Right 06/15/2014  . SKIN GRAFT Left 06/2015   foot   . SKIN SPLIT GRAFT Right 06/15/2014   Procedure: SPLIT THICKNESS  SKIN GRAFT RIGHT FOOT;  Surgeon: Newt Minion, MD;  Location: [facility];  Service: Orthopedics;  Laterality: Right;  . TRANSMETATARSAL AMPUTATION Right   . TRANSMETATARSAL AMPUTATION Right 09/11/2014  . WISDOM TOOTH EXTRACTION       OB History    Gravida  1   Para      Term      Preterm      AB      Living        SAB      IAB      Ectopic      Multiple      Live Births              Family History  Problem Relation Age of Onset  . Hyperlipidemia Mother   . Dementia Mother     Social History   Tobacco Use  . Smoking status: Former Smoker    Years: 15.00    Quit date: 05/16/2008    Years since quitting: 12.1  . Smokeless tobacco: Never Used  Vaping Use  . Vaping Use: Never used  Substance Use Topics  . Alcohol use: Not Currently    Comment: Occasional  . Drug use: No    Home Medications Prior to Admission medications   Medication Sig Start Date End Date Taking? Authorizing Provider  albuterol (PROVENTIL HFA;VENTOLIN HFA) 108 (90 BASE) MCG/ACT inhaler Inhale 2 puffs into the lungs every 6 (six) hours as  needed for wheezing or shortness of breath.     [provider]  aspirin EC 81 MG tablet Take 81 mg by mouth daily.    [provider]  B Complex-C-Zn-Folic Acid (DIALYVITE 161 WITH ZINC) 0.8 MG TABS Take 1 tablet by mouth daily. 03/09/19   [provider]  calcitRIOL (ROCALTROL) 0.5 MCG capsule Take 1 capsule daily 08/13/17   Elayne Snare, MD  calcium acetate (PHOSLO) 667 MG capsule Take 2 capsules (1,334 mg total) by mouth 3 (three) times daily with meals. 08/08/18   Danford, Suann Larry, MD  Continuous Blood Gluc Receiver ([place]) [place] 1 each by Does not apply route See admin instructions. Use Dexcom Receiver to monitor blood sugar continuously. 05/25/19   Elayne Snare, MD  Continuous Blood Gluc Sensor (DEXCOM G6 SENSOR) MISC 1 each by Does not apply route See admin instructions. Use one sensors once every 10 days to monitor blood sugars. 05/25/19   Elayne Snare, MD  Continuous Blood Gluc Transmit (DEXCOM G6 TRANSMITTER) MISC 1 each by Does not apply route See admin instructions. Use one transmitter once every 90 days. 05/25/19   Elayne Snare, MD  CONTOUR NEXT TEST test strip USE AS INSTRUCTED TO CHECK BLOOD SUGAR 4 TIMES A DAY. DX E10.65 06/08/19   Elayne Snare, MD  Cyanocobalamin (B-12) 5000 MCG CAPS Take 5,000 mcg by mouth daily.    [provider]  diphenhydrAMINE (BENADRYL) 25 MG tablet Take 25 mg by mouth daily as needed.     [provider]  folic acid (FOLVITE) 096 MCG tablet Take 400 mcg by mouth daily.    [provider]  insulin aspart (NOVOLOG) 100 UNIT/ML injection USE A MAX OF 65 UNITS DAILY VIA INSULIN PUMP 06/24/20   Elayne Snare, MD  Insulin Human (INSULIN PUMP) SOLN Inject into the skin. Insulin aspart (Novolog) 100 unit/ml---MAX 65 UNITS DAILY    [provider]  norethindrone (AYGESTIN) 5 MG tablet Take 5 mg by mouth See admin instructions.  02/17/19  [provider]  Omega-3 Fatty Acids (OMEGA 3 500) 500 MG  CAPS Take 500 mg by mouth daily.    [provider]  pravastatin (PRAVACHOL) 40 MG tablet TAKE 1 TABLET BY MOUTH EVERY DAY Patient taking differently: Take 40 mg by mouth daily. 07/12/18   Elayne Snare, MD  sulfamethoxazole-trimethoprim (BACTRIM DS) 800-160 MG tablet Take 1 tablet by mouth daily.    [provider]    Allergies    Cleocin [clindamycin hcl], Lisinopril, Amoxicillin, and Bactrim [sulfamethoxazole-trimethoprim]  Review of Systems   Review of Systems  HENT: Positive for nosebleeds.   All other systems reviewed and are negative.   Physical Exam Updated Vital Signs BP (!) 157/122 (BP Location: Right Arm)   Pulse 90   Temp 98.3 F (36.8 C) (Oral)   Resp 20   Ht 5\' 7"  (1.702 m)   Wt 54 kg   SpO2 100%   BMI 18.65 kg/m   Physical Exam Vitals and nursing note reviewed.  Constitutional:      Comments: Chronically ill  HENT:     Head: Normocephalic.     Nose:     Comments: Right nostril with some dried blood in the Kiesselbach triangle.  No obvious active bleeding    Mouth/Throat:     Mouth: Mucous membranes are moist.  Eyes:     Extraocular Movements: Extraocular movements intact.     Pupils: Pupils are equal, round, and reactive to light.  Cardiovascular:     Rate and Rhythm: Normal rate and regular rhythm.     Pulses: Normal pulses.     Heart sounds: Normal heart sounds.  Pulmonary:     Effort: Pulmonary effort is normal.     Breath sounds: Normal breath sounds.  Abdominal:     General: Abdomen is flat.     Palpations: Abdomen is soft.  Musculoskeletal:        General: Normal range of motion.     Cervical back: Normal range of motion.  Skin:    General: Skin is warm.     Capillary Refill: Capillary refill takes less than 2 seconds.  Neurological:     General: No focal deficit present.     Mental Status: She is oriented to person, place, and time.  Psychiatric:        Mood and Affect: Mood normal.        Behavior: Behavior normal.      ED Results / Procedures / Treatments   Labs (all labs ordered are listed, but only abnormal results are displayed) Labs Reviewed  CBC WITH DIFFERENTIAL/PLATELET  BASIC METABOLIC PANEL  CBG MONITORING, ED    EKG None  Radiology No results found.  Procedures .Epistaxis Management  Date/Time: 07/13/2020 10:33 PM Performed by: Drenda Freeze, MD Authorized by: Drenda Freeze, MD   Consent:    Consent obtained:  Verbal   Consent given by:  Patient   Risks, benefits, and alternatives were discussed: yes     Risks discussed:  Bleeding   Alternatives discussed:  No treatment Universal protocol:    Procedure explained and questions answered to patient or proxy's satisfaction: yes     Relevant documents present and verified: yes     Test results available: yes     Patient identity confirmed:  Verbally with patient Anesthesia:    Anesthesia method:  None Procedure details:    Treatment site:  R anterior   Treatment method:  Nasal balloon   Treatment complexity:  Limited   Treatment episode: initial   Post-procedure details:    Assessment:  No improvement   Procedure completion:  Tolerated     .   Medications Ordered in ED Medications  oxymetazoline (AFRIN) 0.05 % nasal spray 1 spray (has no administration in time range)  hydrALAZINE (APRESOLINE) injection 10 mg (has no administration in time range)    ED Course  I have reviewed the triage vital signs and the nursing notes.  Pertinent labs & imaging results that were available during my care of the patient were reviewed by me and considered in my medical decision making (see chart for details).    MDM Rules/Calculators/A&P                         [name] is a 50 y.o. female here with nosebleed.  Patient was also hypoglycemic at home.  On arrival her blood sugar is 71.  Patient does have dried blood in the right Kiesselbach triangle.  She states that she did well with [employer] previously  and one time she even required transfusion for nosebleed.  She is not on any blood thinners.  We will check a CBC and chemistry.  Will place [employer] and give BP meds for hypertension  11:23 PM BP down to 160 from 200.  Hemoglobin is stable at 10.  Her potassium is normal at 4.4. We will discharge patient with ENT follow-up.  Patient is already on IV antibiotics during dialysis.    Final Clinical Impression(s) / ED Diagnoses Final diagnoses:  None    Rx / DC Orders ED Discharge Orders    None       Drenda Freeze, MD 07/13/20 2323

## 2020-07-29 ENCOUNTER — Encounter (HOSPITAL_BASED_OUTPATIENT_CLINIC_OR_DEPARTMENT_OTHER): Payer: Medicare HMO | Admitting: Internal Medicine

## 2020-07-29 ENCOUNTER — Other Ambulatory Visit: Payer: Self-pay

## 2020-07-29 DIAGNOSIS — L97529 Non-pressure chronic ulcer of other part of left foot with unspecified severity: Secondary | ICD-10-CM | POA: Diagnosis not present

## 2020-07-29 DIAGNOSIS — E10621 Type 1 diabetes mellitus with foot ulcer: Secondary | ICD-10-CM | POA: Diagnosis not present

## 2020-07-29 DIAGNOSIS — L97514 Non-pressure chronic ulcer of other part of right foot with necrosis of bone: Secondary | ICD-10-CM

## 2020-07-29 DIAGNOSIS — L97521 Non-pressure chronic ulcer of other part of left foot limited to breakdown of skin: Secondary | ICD-10-CM | POA: Diagnosis not present

## 2020-07-29 NOTE — Progress Notes (Signed)
NEILANI, DUFFEE ([id number]) Visit Report for 07/29/2020 Chief Complaint Document Details Patient Name: Date of Service: [name] 07/29/2020 12:30 PM Medical Record Number: [id number] Patient Account Number: [id number] Date of Birth/Sex: Treating RN: 1970-12-30 (50 y.o. [name] Primary Care Provider: [name], [name] Other Clinician: Referring Provider: Treating Provider/Extender: Kalman Shan MO REIRA, [name] Weeks in Treatment: 30 Information Obtained from: Patient Chief Complaint Significant right foot ulcer with right leg ulcers 4/29: Right foot ulcer, left plantar ulcer and left second toe wound Electronic Signature(s) Signed: 07/29/2020 2:04:46 PM By: Kalman Shan DO Entered By: Kalman Shan on 07/29/2020 13:58:29 -------------------------------------------------------------------------------- Debridement Details Patient Name: Date of Service: [name] 07/29/2020 12:30 PM Medical Record Number: [id number] Patient Account Number: [id number] Date of Birth/Sex: Treating RN: January 23, 1971 (49 y.o. [name] Primary Care Provider: [name], [name] Other Clinician: Referring Provider: Treating Provider/Extender: Kalman Shan MO REIRA, [name] Weeks in Treatment: 99 Debridement Performed for Assessment: Wound #50 Left,Plantar Foot Performed By: Physician Kalman Shan, DO Debridement Type: Debridement Severity of Tissue Pre Debridement: Fat layer exposed Level of Consciousness (Pre-procedure): Awake and Alert Pre-procedure Verification/Time Out Yes - 13:35 Taken: Start Time: 13:36 Pain Control: Other : Benzocaine T Area Debrided (L x W): otal 0.3 (cm) x 0.2 (cm) = 0.06 (cm) Tissue and other material debrided: Non-Viable, Callus, Subcutaneous Level: Skin/Subcutaneous Tissue Debridement Description: Excisional Instrument: Curette Bleeding: Minimum Hemostasis Achieved: Pressure End Time: 13:39 Response to  Treatment: Procedure was tolerated well Level of Consciousness (Post- Awake and Alert procedure): Post Debridement Measurements of Total Wound Length: (cm) 0.5 Width: (cm) 0.5 Depth: (cm) 0.3 Volume: (cm) 0.059 Character of Wound/Ulcer Post Debridement: Stable Severity of Tissue Post Debridement: Fat layer exposed Post Procedure Diagnosis Same as Pre-procedure Electronic Signature(s) Signed: 07/29/2020 2:04:46 PM By: Kalman Shan DO Signed: 07/29/2020 5:13:52 PM By: Lorrin Jackson Entered By: Lorrin Jackson on 07/29/2020 13:43:25 -------------------------------------------------------------------------------- Debridement Details Patient Name: Date of Service: [name] 07/29/2020 12:30 PM Medical Record Number: [id number] Patient Account Number: [id number] Date of Birth/Sex: Treating RN: 1970-11-22 (51 y.o. [name] Primary Care Provider: [name], [name] Other Clinician: Referring Provider: Treating Provider/Extender: Kalman Shan MO REIRA, [name] Weeks in Treatment: 99 Debridement Performed for Assessment: Wound #51 Left T Second oe Performed By: Physician Kalman Shan, DO Debridement Type: Debridement Severity of Tissue Pre Debridement: Fat layer exposed Level of Consciousness (Pre-procedure): Awake and Alert Pre-procedure Verification/Time Out Yes - 13:35 Taken: Start Time: 13:39 Pain Control: Other : Benzocaine T Area Debrided (L x W): otal 1.5 (cm) x 1 (cm) = 1.5 (cm) Tissue and other material debrided: Non-Viable, Skin: Dermis Level: Skin/Dermis Debridement Description: Selective/Open Wound Instrument: Forceps, Scissors Bleeding: Minimum Hemostasis Achieved: Pressure End Time: 13:45 Response to Treatment: Procedure was tolerated well Level of Consciousness (Post- Awake and Alert procedure): Post Debridement Measurements of Total Wound Length: (cm) 1.5 Width: (cm) 1 Depth: (cm) 0.3 Volume: (cm) 0.353 Character of  Wound/Ulcer Post Debridement: Stable Severity of Tissue Post Debridement: Fat layer exposed Post Procedure Diagnosis Same as Pre-procedure Electronic Signature(s) Signed: 07/29/2020 2:04:46 PM By: Kalman Shan DO Signed: 07/29/2020 5:13:52 PM By: Lorrin Jackson Entered By: Lorrin Jackson on 07/29/2020 13:46:20 -------------------------------------------------------------------------------- HPI Details Patient Name: Date of Service: [name] 07/29/2020 12:30 PM Medical Record Number: [id number] Patient Account Number: [id number] Date of Birth/Sex: Treating RN: 07/22/1970 (50 y.o. [name]  Primary Care Provider: Other Clinician: [name], [name] Referring Provider: Treating Provider/Extender: Kalman Shan MO REIRA, [name] Weeks in Treatment: 48 History of Present Illness HPI Description: 50 year old diabetic who is known to have type 1 diabetes which is poorly controlled last hemoglobin A1c was 11%. She comes in with a ulcerated area on the left lateral foot which has been there for over 6 months. Was recently she has been treated by [name] of podiatry who saw her last on 05/28/2016. Review of his notes revealed that the patient had incision and drainage with placement of antibiotic beads to the left foot on 04/11/2016 for possible osteomyelitis of the cuboid bone. Over the last year she's had a history of amputation of the left fifth toe and a femoropopliteal popliteal bypass graft somewhere in April 2017. 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 His note [name] mentions that the patient has been referred to me for further wound care and possibly great candidate for hyperbaric oxygen therapy due to recurrent osteomyelitis. However we do not have any x-rays of biopsy reports confirming this. He has been on several antibiotics including Bactrim and most recently is on doxycycline for an MRSA. I understand, the patient was not a candidate  for IV antibiotics as she has had previous PICC lines which resulted in blood clots in both arms. There was a x-ray report dated 04/04/2016 on [name] notes which showed evidence of fifth ray resection left foot with osteolytic changes noted to the fourth metatarsal and cuboid bone on the left. 06/13/2016 -- had a left foot x-ray which showed no acute fracture or dislocation and no definite radiographic evidence of osteomyelitis. Advanced osteopenia was seen. 06/20/2016 -- she has noticed a new wound on the right plantar foot in the region where she had a callus before. 06/27/16- the patient did have her x-ray of the right foot which showed no findings to suggest osteomyelitis. She saw her endocrinologist, [name], yesterday. Her A1c in January was 11. He also indicates mismanagement and noncompliance regarding her diabetes. She is currently on Bactrim for a lip infection. She is complaining of nausea, vomiting and diarrhea. She is unable to articulate the exact orders or dosing of the Bactrim; it is unclear when she will complete this. 07/04/2016 -- results from [facility] of ABIs with ankle waveforms were noted from 02/14/2016. The examination done on 06/27/2015 showed noncompressible ABIs with the right being 1.45 and the left being 1.33. The present examination showed a right ABI of 1.19 on the left of 1.33. The conclusion was that right normal ABI in the lower extremity at rest however compared to previous study which was noncompressible ABI may be falsely elevated side suggesting medial calcification. The left ABI suggested medial calcification. 08/01/2016 -- the patient had more redness and pain on her right foot and did not get to come to see as noted she see her PCP or go to the ER and decided to take some leftover metronidazole which she had at home. As usual, the patient does report she feels and is rather noncompliant. 08/08/2016 -- -- x-ray of the right foot --  FINDINGS:Transmetatarsal amputation is noted. No bony destruction is noted to suggest osteomyelitis. IMPRESSION: No evidence of osteomyelitis. Postsurgical changes are seen. MRI would be more sensitive for possible bony changes. Culture has grown Serratia Marcescens -- sensitive to Bactrim, ciprofloxacin, ceftazidime she was seen by [name] on 08/06/2016. He did not find any exposed bone, muscle, tendon,  ligament or joint. There was no malodor and he did a excisional debridement in the office. ============ Old notes: 50 year old patient who is known to the wound clinic for a while had been away from the wound clinic since 09/01/2014. Over the last several months she has been admitted to various hospitals including [facility]. She was treated for a right metatarsal osteomyelitis with a transmetatarsal amputation and this was done about 2 months ago. He has a small ulcerated area on the right heel and she continues to have an ulcerated area on the left plantar aspect of the foot. The patient was recently admitted to the [facility] hospital group between 7/12 and 10/18/2014. she was given 3 weeks of IV vancomycin and was to follow-up with her surgeons at [facility] and also took oral vancomycin for C. difficile colitis. 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 MRI of the left foot done on 10/14/2014 showed no abscess or osteomyelitis. 04/27/15; this is a patient we know from previous stays in the wound care center. She is a type I diabetic I am not sure of her control currently. Since the last time I saw her she is had a right transmetatarsal amputation and has no wounds on her right foot and has no  open wounds. She is been followed at the wound care center at [facility] in [place]. She comes today with the desire to undergo hyperbaric treatment locally. Apparently one of her wound care providers in [place] has suggested hyperbarics. This is in response to an MRI from 04/18/15 that showed increased marrow signal and loss of the proximal fifth metatarsal cortex evidence of osteomyelitis with likely early osteomyelitis in the cuboid bone as well. She has a large wound over the base of the fifth metatarsal. She also has a eschar over her the tips of her toes on 1,3 and 5. She does not have peripheral pulses and apparently is going for an angiogram tomorrow which seems reasonable. After this she is going to infectious disease at [facility]. They have been using Medihoney to the large wound on the lateral aspect of the left foot to. The patient has known Charcot deformity from diabetic neuropathy. She also has known diabetic PAD. Surprisingly I can't see that she has had any recent antibiotics, the patient states the last antibiotic she had was at the end of November for 10 days. I think this was in response to culture that showed group G strep although I'm not exactly sure where the culture was from. She is also had arterial studies on 03/29/15. This showed a right ABI of 1.4 that was noncompressible. Her left ABI was 0.73. 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 It was not felt that arterial inflow was adequate for healing of a foot ulcer. Her Doppler waveforms looked monophasic ===== READMISSION 02/28/17; this is in an now 50 year old woman we've had at several different occasions in this clinic. 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 She has a remote ex-smoker. She was last seen in this clinic by Dr. Con Memos I think in May. More recently she is been followed by her podiatrist [name] an infectious disease Dr.  Megan Salon. She has 2 open wounds the major one is over  the right first metatarsal head she also has a wound on the left plantar foot. an MRI of the right foot on 01/01/17 showed a soft tissue ulcer along the plantar aspect of the first metatarsal base consistent with osteomyelitis of the first metatarsal stump. Dr. 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 According to the patient this is been open for slightly over a month. She has been on a combination of Cipro 500 twice a day, Zyvox 600 twice a day and Flagyl 500 3 times a day for over a month now as directed by Dr. Megan Salon. cultures of the right foot earlier this year showed MRSA in January and Serratia in May. January also had a few viridans strep. Recent x-rays of both feet were done and [name] office and I don't have these reports. The patient has known PAD and has a history of aleft femoropopliteal bypass in April 2017. 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 Dwyane Dee. In spite of this the last hemoglobin A1c I can see is 10.1 on 01/01/2017. She is being referred by 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 She is been using Medihoney to this area and also an area on the left midfoot. She is using healing sandals bilaterally. ABIs in this clinic at the left posterior tibial was 1.1 noncompressible on the right READMISSION Non invasive vascular NOVANT 5/18 Aftercare following surgery of the circulatory system Procedure Note - Interface, External Ris In - 08/13/2016 11:05 AM EDT Procedure: Examination consists of physiologic resting arterial pressures of the brachial and ankle arteries bilaterally with continuous wave Doppler  waveform analysis. Previous: Previous exam performed on 02/14/16 demonstrated ABIs of Rt = 1.19 and Lt = 1.33. Right: ABI = non-compressible PT 1.47 DP. S/P transmet amputation. , Left: ABI = 1.52, 2nd digit pressure = 87 mmHg Conclusions: Right: ABI (>1.3) may be falsely elevated, suggesting medial calcification. Left: ABI (>1.3) may be falsely elevated, suggesting medial calcification The patient is a now 50 year old type I diabetic is had multiple issues her graded to chronic diabetic foot ulcers. 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 We had her in the clinic lastin November. At that point she had wounds on her bilateral feet.she had wanted to try hyperbarics however the healogics review process denied her because she hadn't followed up with her vascular surgeon for her left femoropopliteal bypass. The bypass was done by Dr. Raul Del at [facility]. We made her a follow-up with Dr. Raul Del however she did not keep the appointment and therefore she was not approved The patient shows me a small wound on her left fourth metatarsal head on her phone. She developed rapid discoloration in the plantar aspect of the left foot and she was admitted to hospital from 2/2 through 05/10/17 with wet gangrene of the left foot osteomyelitis of the fourth metatarsal heads. She was admitted acutely ill with a temperature of 103. She was started on broad-spectrum vancomycin and cefepime. On 05/06/17 she was taken to the OR by [name] her podiatric surgeon for an incision and drainage irrigation of the left foot wound. Cultures from this surgery revealed group be strep and anaerobes. she  was seen by Dr.Xu of orthopedic surgery and scheduled for a below-knee amputation which she u refused. Ultimately she was discharged on Levaquin and Flagyl for one month. MRI 05/05/17 done while she was in the hospital showed abscess adjacent to the  fourth metatarsal head and neck small abscess around the fourth flexor tendon. Inflammatory phlegmon and gas in the soft tissues along the lateral aspect of the fourth phalanx. Findings worrisome for osteomyelitis involving the fourth proximal and middle phalanx and also the third and fourth metatarsals. Finally the patient had actually shortly before this followed up with Dr. Raul Del at no time on 04/29/17. He felt that her left femoropopliteal bypass was patent he felt that her left-sided toe pressures more than adequate for healing a wound on the left foot. This was before her acute presentation. 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 05/28/17; she is started hyperbarics. The patient tells me that for some reason she was not actually on Levaquin but I think on ciprofloxacin. She was on Flagyl. She only started her Levaquin yesterday due to some difficulty with the pharmacy and perhaps her sister picking it up. She has an appointment with [name] tomorrow and with infectious disease early next week. She has no new complaints 06/06/17; the patient continues in hyperbarics. She saw [name] on 05/29/17 who is her podiatric surgeon. He is elected for a transmetatarsal amputation on 06/27/17. I'm not sure at what level he plans to do this amputation. The patient is unaware She also saw Dr. Megan Salon of infectious disease who elected to continue her on current antibiotics I think this is ciprofloxacin and Flagyl. I'll need to clarify with her tomorrow if she actually has this. We're using silver alginate to the actual wound. Necrotic surface today with material under the flap of her foot. Original MRI showed abscesses as well as osteomyelitis of the proximal and middle fourth phalanx and the third and fourth metatarsal heads 06/11/17; patient continues in hyperbarics and continues on oral antibiotics. She is doing well. The wound looks better. The necrotic part of this under the flap in her superior foot  also looks better. she is been to see [name]. I haven't had a chance to look at his note. Apparently he has put the transmetatarsal amputation on hold her request it is still planning to take her to the OR for debridement and product application ACEL. I'll see if I can find his note. I'll therefore leave product ordering/requests to [name] for now. I was going to look at Dermagraft 06/18/17-she is here in follow-up evaluation for bilateral foot wounds. She continues with hyperbaric therapy. She states she has been applying manuka honey to the right plantar foot and alternate manuka honey and silver alginate to the left foot, despite our orders. We will continue with same treatment plan and she will follo up next week. 06/25/17; I have reviewed [name] last note from 3/11. She has operative debridement in 2 days' time. By review his note apparently they're going to place there is skin over the majority of this wound which is a good choice. She has a small satellite area at the most proximal part of this wound on the left plantar foot. The area on the right plantar foot we've been using silver alginate and it is close to healing. 07/02/17; unfortunately the patient was not easily approved for [name] proposed surgery. I'm not completely certain what the issue is. She has been using silver alginate to the wound she  has completed a first course of hyperbarics. She is still on Levaquin and Flagyl. I have really lost track of the time course here.I suspect she should have another week to 2 of antibiotics. I'll need to see if she is followed up with infectious disease Dr. Megan Salon 07/09/17; the patient is followed up with Dr. Megan Salon. 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 She continues on Levaquin and metronidazole continuing both of these for now I think she is been on fr about 6 weeks. She still has some drainage but no pain. No fever. Her had been plans  for her to go to the OR for operative debridement with her podiatrist [name], I am not exactly sure where that is. I'll probably slip a note to [name] today. I note that she follows with Dr. Dwyane Dee of endocrinology. We have her recertified for hyperbaric oxygen. I have not heard about Dermagraft however I'll see if [name] is planning a skin substitute as well 07/16/17; the patient tells me she is just about out of Biggs. I'll need to check Dr. Hale Bogus last notes on this. She states she has plenty of Flagyl however. She comes in today complaining of pain in the right lateral foot which she said lasted for about a day. The wound on the right foot is actually much more medially. She also tells me that the [facility] cost a lot of pain in the left foot wound and she turned back to silver alginate. Finally Dermagraft has a $865 per application co-pay. She cannot afford this 07/23/17; patient arrives today with the wound not much smaller. There is not much new to add. She has not heard from [name] all try to put in a call to them today. She was asking about Dermagraft again and she has an over $784 per application co-pay she states that she would be willing to try to do a payment plan. I been tried to avoid this. We've been using silver alginate, I'll change to [facility] 07/30/17-She is here in follow-up evaluation for left foot ulcer. She continues hyperbaric medicine. The left foot ulcer is stable we will continue with same treatment plan 08/06/17; she is here for evaluation of her left foot ulcer. Currently being treated for hyperbarics or underlying osteomyelitis. She is completed antibiotics. The left foot ulcer is better smaller with healthier looking granulation. For various reasons I am not really clear on we never got her back to the OR with [name]. He did not respond to my secure text message. Nevertheless I think that surgery on this point is not necessary nor am I  completely clear that a skin substitute is necessary The patient is complaining about pain on the outside of her right foot. 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 There is no erythema. She also states the foot is warm versus her other part of her upper leg and this is largely true. It is not totally clear to me what's causing this. She thinks it's different from her usual neuropathy pain 08/13/17; she arrives in clinic today with a small wound which is superficial on her right first metatarsal head. 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 She tells [place] she was up on her feet over the Mother's Day celebration. The large wound is on the left foot. Continues with hyperbarics for underlying osteomyelitis. We're using Hydrofera Blue. She asked me today about where we were with  Dermagraft. I had actually excluded this because of the co-pay however she wants to assume this therefore I'll recheck the co-pay an order for next week. 08/20/17; the patient agreed to accept the co-pay of the first Apligraf which we applied today. She is disappointed she is finishing hyperbarics will run this through the insurance on the extent of the foot infection and the extent of the wound that she had however she is already had 60 dive's. Dermagraft No. 1 08/27/17; Dermagraft No. 2. She is not eligible for any more hyperbaric treatments this month. She reports a fair amount of drainage and she actually changed to the external dressings without disturbing the direct contact layer 09/03/17; the patient arrived in clinic today with the wound superficially looking quite healthy. Nice vibrant red tissue with some advancing epithelialization although not as much adherence of the flap as I might like. However she noted on her own fourth toe some bogginess and she brought that to our attention. Indeed this was boggy feeling like a possibility of subcutaneous fluid. She stated that this was similar to  how an issue came up on the lateral foot that led to her fifth ray amputation. She is not been unwell. We've been using Dermagraft 09/10/17; the culture that I did not last week was MRSA. She saw Dr. Megan Salon this morning who is going to start her on vancomycin. I had sent him a secure a text message yesterday. I also spoke with her podiatric surgeon [name] about surgery on this foot the options for conserving a functional foot etc. Promised me he would see her and will make back consultation today. Paradoxically her actual wound on the plantar aspect of her left foot looks really quite good. I had given her 5 days worth of Baxdella to cover her for MRSA. Her MRI came back showing osteomyelitis within the third metatarsal shaft and head and base of the third and fourth proximal phalanx. She had extensive inflammatory changes throughout the soft tissue of the lateral forefoot. With an ill-defined fluid around the fourth metatarsal extending into the plantar and dorsal soft tissues 09/19/17; the patient is actually on oral Septra and Flagyl. She apparently refused IV vancomycin. She also saw [name] at my request who is planning her for a left BKA sometime in mid July. MRI showed osteomyelitis within the third metatarsal shaft and head and the basis of the third and fourth proximal phalanx. I believe there was felt to be possible septic arthritis involving the third MTP. 09/26/17; the patient went back to Dr. Megan Salon at my suggestion and is now receiving IV daptomycin. Her wound continues to look quite good making the decision to proceed with a transmetatarsal amputation although more difficult for the patient. I believe in my extensive discussions with her she has a good sense of the pros and cons of this. I don't NV the tuft decision she has to make. She has an appointment with [name] I believe in mid July and I previously spoken to him about this issue Has we had used 3 previous Dermagraft.  Given the condition of the wound surface I went ahead and added the fourth one today area and I did this not fully realizing that she'll be traveling to [place] next week. I'm hopeful she can come back in 2 weeks 10/21/17; Her same Dermagraft on for about 3-1/2 weeks. In spite of this the wound arrives looking quite healthy. There is been a lot of healing dimensions are smaller. Looking  at the square shaped wound she has now there is some undermining and some depth medially under the undermining although I cannot palpate any bone. No surrounding infection is obvious. She has difficult questions about how to look at this going forward vis--vis amputations versus continued medical therapy. T be truthful the wound is looks o so healthy and it is continued to contract. Hard to justify foot surgery at this point although I still told her that I think it might come to that if we are not able to eradicate the underlying MRSA. She is still highly at risk and she understands this 11/06/17 on evaluation today patient appears to be doing better in regard to her foot ulcer. She's been tolerating the dressing changes without complication. Currently she is here for her Dermagraft #6. Her wound continues to make excellent progress at this point. She does not appear to have any evidence of infection which is good news. 11/13/17 on evaluation today patient appears to be doing excellent at this time. She is here for repeat Dermagraft application. This is #7. Overall her wound seems to be making great progress. 12/05/17; the patient arrives with the wound in much better condition than when I last saw this almost 6 weeks ago. She still has a small probing area in the left metatarsal head region on the lateral aspect of her foot. We applied her last Dermagraft today. Since the last time she is here she has what appears to a been a blood blister on the plantar aspect of left foot although I don't see this is threatening.  There is also a thick raised tissue on the right mid metatarsal head region. This was not there I don't think the last time she was here 3 weeks ago. 12/12/17; the patient continues to have a small programming area in the left metatarsal head region on the lateral aspect of her foot which was the initial large surgical wound. I applied her last Apligraf last week. I'm going to use Endoform starting today Unfortunately she has an excoriated area in the left mid foot and the right mid foot. The left midfoot looks like a blistered area this was not opened last week it certainly is open today. Using silver alginate on these areas. She promises me she is offloading this. 12/19/17; the small probing area in the left metatarsal head eyes think is shallower. In general her original wound looks better. We've been using Endoform. The area inferiorly that I think was trauma last week still requires debridement a lot of nonviable surface which I removed. She still has an open open area distally in her foot Similarly on the right foot there is tightly adherent surface debris which I removed. Still areas that don't look completely epithelialized. This is a small open area. We used silver alginate on these areas 12/26/2017; the patient did not have the supplies we ordered from last week including the Endoform. The original large wound on the left lateral foot looks healthy. She still has the undermining area that is largely unchanged from last week. She has the same heavily callused raised edged wounds on the right mid and left midfoot. Both of these requiring debridement. We have been using silver alginate on these areas 01/02/2018; there is still supply issues. We are going to try to use Prisma but I am not sure she actually got it from what she is saying. She has a new open area on the lateral aspect of the left fourth toe [previous fifth  ray amputation]. Still the one tunneling area over the fourth metatarsal head.  The area is in the midfoot bilaterally still have thick callus around them. She is concerned about a raised swelling on the lateral aspect of the foot. However she is completely insensate 01/10/2018; we are using Prisma to the wounds on her bilateral feet. Surprisingly the tunneling area over the left fourth metatarsal head that was part of her original surgery has closed down. She has a small open area remaining on the incision line. 2 open areas in the midfoot. 02/10/2018; the patient arrives back in clinic after a month hiatus. She was traveling to visit family in [place]. Is fairly clear she was not offloading the areas on her feet. The original wound over the left lateral foot at the level of metatarsal heads is reopened and probes medially by about a centimeter or 2. She notes that a week ago she had purulent drainage come out of an area on the left midfoot. Paradoxically the worst area is actually on the right foot is extensive with purulent drainage. We will use silver alginate today 02/17/2018; the patient has 3 wounds one over the left lateral foot. She still has a small area over the metatarsal heads which is the remnant of her original surgical wound. This has medial probing depth of roughly 1.4 cm somewhat better than last week. The area on the right foot is larger. We have been using silver alginate to all areas. The area on the right foot and left foot that we cultured last week showed both Klebsiella and Proteus. Both of these are quinolone sensitive. The patient put her's self on Bactrim and Flagyl that she had left hanging around from prior antibiotic usages. She was apparently on this last week when she arrived. I did not realize this. Unfortunately the Bactrim will not cover either 1 of these organisms. We will send in Cipro 500 twice daily for a week 03/04/2018; the patient has 2 wounds on the left foot one is the original wound which was a surgical wound for a deep DFU. At one  point this had exposed bone. She still has an area over the fourth metatarsal head that probes about 1.4 cm although I think this is better than last week. I been using silver nitrate to try and promote tissue adherence and been using silver alginate here. She also has an area in the left midfoot. This has some depth but a small linear wound. Still requiring debridement. On the right midfoot is a circular wound. A lot of thick callus around this area. We have been using silver alginate to all wound areas She is completed the ciprofloxacin I gave her 2 weeks ago. 03/11/2018; the patient continues to have 2 open areas on the left foot 1 of which was the original surgical wound for a deep DFU. Only a small probing area remains although this is not much different from last week we have been using silver alginate. The other area is on the midfoot this is smaller linear but still with some depth. We have been using silver alginate here as well On the right foot she has a small circular wound in the mid aspect. This is not much smaller than last time. We have been using silver alginate here as well 03/18/2018; she has 3 wounds on the left foot the original surgical wound, a very superficial wound in the mid aspect and then finally the area in the mid plantar foot. She arrives in  today with a very concerning area in the wound in the mid plantar foot which is her most proximal wound. There is undermining here of roughly 1-1/2 cm superiorly. Serosanguineous drainage. She tells me she had some pain on for over the weekend that shot up her foot into her thigh and she tells me that she had a nodule in the groin area. She has the single wound in the right foot. We are using endoform to both wound areas 03/24/2018; the patient arrives with the original surgical wound in the area on the left midfoot about the same as last week. There is a collection of fluid under the surface of the skin extending from the  surgical wound towards the midfoot although it does not reach the midfoot wound. The area on the right foot is about the same. Cultures from last week of the left midfoot wound showed abundant Klebsiella abundant Enterococcus faecalis and moderate methicillin resistant staph I gave her Levaquin but this would have only covered the Klebsiella. She will need linezolid 04/01/2018; she is taking linezolid but for the first few days only took 1 a day. I have advised her to finish this at twice daily dosing. In any case all of her wounds are a lot better especially on the left foot. The original surgical wound is closed. The area on the left midfoot considerably smaller. The area on the right foot also smaller. 04/08/2018; her original surgical wound/osteomyelitis on the left foot remains closed. She has area on the left foot that is in the midfoot area but she had some streaking towards this. This is not connected with her original wound at least not visually. Small wound on the right midfoot appears somewhat smaller. 04/15/18; both wounds looks better. Original wound is better left midfoot. Using silver alginate 1/21; patient states she uses saltwater soak in, stones or remove callus from around her wounds. She is also concerned about a blood blister she had on the left foot but it simply resolved on its own. We've been using silver alginate 1/28; the patient arrives today with the same streaking area from her metatarsals laterally [the site of her original surgical wound] down to the middle of her foot. There is some drainage in the subcutaneous area here. This concerns me that there is actually continued ongoing infection in the metatarsals probably the fourth and third. This fixates an MRI of the foot without contrast [chronic renal failure] The wound in the mid part of the foot is small but I wonder whether this area actually connects with the more distal foot. The area on the right midfoot is probably  about the same. Callus thick skin around the small wound which I removed with a curette we have been using silver alginate on both wound areas 2/4; culture I did of the draining site on the left foot last time grew methicillin sensitive staph aureus. MRI of the left foot showed interval resolution of the findings surrounding the third metatarsal joint on the prior study consistent with treated osteomyelitis. Chronic soft tissue ulceration in the plantar and lateral aspect of the forefoot without residual focal fluid collection. No evidence of recurrent osteomyelitis. 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 She tells me she had nausea and vomiting  over the weekend and missed 2 days. In general her foot looks much the same. She has a small open area just below the left fourth metatarsal head. A linear area in the left midfoot. Some discoloration extending from the inferior part of this into the left lateral foot although this appears to be superficial. She has a small area on the right midfoot which generally looks smaller after debridement 2/18; the patient is completing his cephalexin and has another 2 days. She continues to have open areas on the left and right foot. 2/25; she is now off antibiotics. The area on the left foot at the site of her original surgical wound has closed yet again. She still has open areas in the mid part of her foot however these appear smaller. The area on the right mid foot looks about the same. We have been using silver alginate She tells me she had a serious hypoglycemic spell at home. She had to have EMS called and get IV dextrose 3/3; disappointing on the left lateral foot large area of necrotic tissue surrounding the linear  area. This appears to track up towards the same original surgical wound. Required extensive debridement. The area on the right plantar foot is not a lot better also using silver 3/12; the culture I did last time showed abundant enterococcus. I have prescribed Augmentin, should cover any unrecognized anaerobes as well. In addition there were a few MRSA and Serratia that would not be well covered although I did not want to give her multiple antibiotics. She comes in today with a new wound in the right midfoot this is not connected with the original wound over her MTP a lot of thick callus tissue around both wounds but once again she said she is not walking on these areas 3/17-Patient comes in for follow-up on the bilateral plantar wounds, the right midfoot and the left plantar wound. Both these are heavily callused surrounding the wounds. We are continuing to use silver alginate, she is compliant with offloading and states she uses a wheelchair fairly often at home 3/24; both wound areas have thick callus. However things actually look quite a bit better here for the majority of her left foot and the right foot. 3/31; patient continues to have thick callused somewhat irritated looking tissue around the wounds which individually are fairly superficial. There is no evidence of surrounding infection. We have been using silver alginate however I change that to [facility] today 4/17; patient returns to clinic after having a scare with Covid she tested negative in her primary doctor's office. She has been using Hydrofera Blue. She does not have an open area on the right foot. On the left foot she has a small open area with the mid area not completely viable. She showed me pictures of what looks like a hemorrhagic blister from several days ago but that seems to have healed over this was on the lateral left foot 4/21; patient comes in to clinic with both her wounds on her feet closed. However over the weekend  she started having pain in her right foot and leg up into the thigh. She felt as though she was running a low-grade fever but did not take her temperature. She took a doxycycline that she had leftover and yesterday a single Septra and metronidazole. She thinks things feel somewhat better. 4/28; duplex ultrasound I ordered last week was negative for DVT or superficial thrombophlebitis. She is completed the doxycycline I gave her. States she is  still having a lot of pain in the right calf and right ankle which is no better than last week. She cannot sleep. She also states she has a temperature of up to 101, coughing and complaining of visual loss in her bilateral eyes. Apparently she was tested for Covid 2 weeks ago at [facility] and that was negative. Readmission: 09/03/18 patient presents back for reevaluation after having been evaluated at the end of April regarding erythema and swelling of her right lower extremity. Subsequently she ended up going to the hospital on 07/29/18 and was admitted not to be discharged until 08/08/18. Unfortunately it was noted during the time that she was in the hospital that she did have methicillin-resistant Staphylococcus aureus as the infection noted at the site. It was also determined that she did have osteomyelitis which appears to be fairly significant. She was treated with vancomycin and in fact is still on IV vancomycin at dialysis currently. This is actually slated to continue until 09/12/18 at least which will be the completion of the six weeks of therapy. Nonetheless based on what I'm seeing at this point I'm not sure she will be anywhere near ready to discontinue antibiotics at that time. Since she was released from the hospital she was seen by [name] who is her podiatrist on 08/27/18. His note specifically states that he is recommended that the patient needs of one knee amputation on the right as she has a life- threatening situation that can lead quickly to sepsis.  The patient advised she would like to try to save her leg to which [name] apparently told her that this was against all medical advice. She also want to discontinue the Wound VAC which had been initiated due to the fact that she wasn't pleased with how the wound was looking and subsequently she wanted to pursue applying Medihoney at that time. He stated that he did not believe that the right lower extremity was salvageable and that the patient understood but would still like to attempt hyperbaric option therapy if it could be of any benefit. She was therefore referred back to [place] for further evaluation. He plans to see her back next week. Upon inspection today patient has a significant amount purulent drainage noted from the wound at this point. The bone in the distal portion of her foot also appears to be extremely necrotic and spongy. When I push down on the bone it bubbles and seeps purulent drainage from deeper in the end of the foot. I do not think that this is likely going to heal very well at all and less aggressive surgical debridement were undertaken more than what I believe we can likely do here in our office. 09/12/2018; I have not seen this patient since the most recent hospitalization although she was in our clinic last week. I have reviewed some of her records from a complex hospitalization. 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 There is situation was complicated by MRSA bacteremia and acute on chronic renal failure now on dialysis. She is receiving vancomycin at dialysis. We started her on Dakin's wet-to-dry last week she is changing this daily. There is still purulent drainage coming out of her foot. Although she is apparently "agreeable" to a below-knee amputation which is been suggested by multiple clinicians she wants this to be done in [place]. She apparently has a telehealth visit with that provider sometime in late [place]  6/24. I have told her I  think this is probably too long. Nevertheless I could not convince her to allow a local doctor to perform BKA. 09/19/2018; the patient has a large necrotic area on the right anterior foot. She has had previous transmetatarsal amputations. Culture I did last week showed MRSA nothing else she is on vancomycin at dialysis. She has continued leaking purulent drainage out of the distal part of the large circular wound on the right anterior foot. She apparently went to see Dr. 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 Somehow that translated into her being referred to plastic surgery for debridement of the area. I gather she basically refused amputation although I do not have a copy of Dr. Berenice Primas notes. The patient really wants to have a trial of hyperbaric oxygen. I agreed with initial assessment in this clinic that this was probably too far along to benefit however if she is going to have plastic surgery I think she would benefit from ancillary hyperbaric oxygen. The issue here is that the patient has benefited as maximally as any patient I have ever seen from hyperbaric oxygen therapy. Most recently she had exposed bone on the lateral part of her left foot after a surgical procedure and that actually has closed. She has eschared areas in both heels but no open area. She is remained systemically well. I am not optimistic that anything can be done about this but the patient is very clear that she wants an attempt. The attempt would include a wound VAC further debridements and hyperbaric oxygen along with IV antibiotics. 6/26; I put her in for a trial of hyperbaric oxygen only because of the dramatic response she has had with wounds on her left midfoot earlier this year which was a surgical wound that went straight to her bone over the metatarsal heads and also remotely the left third toe. We will see if we can get this through our review process and  insurance. She arrives in clinic with again purulent material pouring out of necrotic bone on the top of the foot distally. There is also some concerning erythema on the front of the leg that we marked. It is bit difficult to tell how tender this is because of neuropathy. I note from infectious disease that she had her vancomycin extended. 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 She had the wound VAC on for part of the week. The rest of the time she is putting various things on this including Medihoney, "ionized water" silver sorb gel etc. 7/7; follow-up along with HBO. She is still on vancomycin at dialysis. She has a large open area on the dorsal right foot and a small dark eschar area on her heel. There is a lot less erythema in the area and a lot less tenderness. From an infection point of view I think this is better. She still has a lot of necrosis in the remaining right forefoot [previous TMA] we are still using the wound VAC in this area 7/16; follow-up along with HBO. I put her on linezolid after she finished her vancomycin. We started this last Friday I gave her 2 weeks worth. I had the expectation that she would be operatively debrided by Dr. Marla Roe but that still has not happened yet. Patient phoned the office this week. She arrives for review today after HBO. The distal part of this wound is completely necrotic. Nonviable pieces of tendon bone was still purulent drainage. Also concerning that she has black  eschar over the heel that is expanding. I think this may be indicative of infection in this area as well. She has less erythema and warmth in the ankle and calf but still an abnormal exam 7/21 follow-up along with HBO. I will renew her linezolid after checking a CBC with differential monitoring her blood counts especially her platelets. She was supposed to have surgery yesterday but if I am reading things correctly this was canceled after her blood  sugar was found to be over 500. I thought Dr. Marla Roe who called me said that they were sending her to the ER but the patient states that was not the case. 7/28. Follow-up along with HBO. She is on linezolid I still do not have any lab work from dialysis even though I called last week. The patient is concerned about an area on her left lateral foot about the level of the base of her fifth metatarsal. I did not really see anything that ominous here however this patient is in [place] ability to point out problems that she is sensing and she has been accurate in the past Finally she received a call from Dr. Marla Roe who is referring her to another orthopedic surgeon stating that she is too booked up to take her to the operating room now. Was still using a wound VAC on the foot 8/3 -Follow-up after HBO, she is got another week of linezolid, she is to call [facility] for an appointment, x-rays of both feet were reviewed, the left foot x-ray with third MTP joint osteo- Right foot x-ray widespread osteo-in the right midfoot Right ankle x-ray does not show any active evidence of infection 8/11-Patient is seen after HBO, the wounds on the right foot appear to be about the same, the heel wound had some necrotic base over tendon that was debrided with a curette 8/21; patient is seen after HBO. The patient's wound on her dorsal foot actually looks reasonably good and there is substantial amount of epithelialization however the open area distally still has a lot of necrotic debris partially bone. I cannot really get a good sense of just how deep this probes under the foot. She has been pressuring me this week to order medical maggots through a company in [place] for her. The problem I have is there is not a defined wound area here. On the positive side there is no purulence. She has been to see infectious disease she is still on Septra DS although I have not had a chance to review their notes 8/28; patient is  seen in conjunction with HBO. The wounds on her foot continued to improve including the right dorsal foot substantially the, the distal part of this wound and the area on the right heel. We have been using a wound VAC over this chronically. She is still on trimethoprim as directed by infectious disease 9/4; patient is seen in conjunction with HBO. Right dorsal foot wound substantially anteriorly is better however she continues to have a deep wound in the distal part of this that is not responding. We have been using silver collagen under border foam Area on the right plantar medial heel seems better. We have been using Hydrofera Blue 12/12/18 on evaluation today patient appears to be doing about the same with regard to her wound based on prior measurements. She does have some necrotic tissue noted on the lateral aspect of the wound that is going require a little bit of sharp debridement today. This includes what appears to be  potentially either severely necrotic bone or tendon. Nonetheless other than that she does not appear to have any severe infection which is good news 9/18; it is been 2 weeks since I saw this wound. She is tolerating HBO well. Continued dramatic improvement in the area on the right dorsal foot. She still has a small wound on the heel that we have been using Hydrofera Blue. She continues with a wound VAC 9/24; patient has to be seen emergently today with a swelling on her right lateral lower leg. She says that she told Dr. Evette Doffing about this and also myself on a couple of occasions but I really have no recollection of this. She is not systemically unwell and her wound really looked good the last time I saw this. She showed this to providers at dialysis and she was able to verify that she was started on cephalexin today for 5 doses at dialysis. She dialyzes on Tuesday Thursday and Saturday. 10/2; patient is seen in conjunction with HBO. The area that is draining on the right anterior  medial tibia is more extensive. Copious amounts of serosanguineous drainage with some purulence. We are still using the wound VAC on the original wound then it is stable. Culture I did of the original IandD showed MRSA I contacted dialysis she is now on vancomycin with dialysis treatments. I asked them to run a month 10/9; patient seen in conjunction with HBO. She had a new spontaneous open area just above the wound on the right medial tibia ankle. More swelling on the right medial tibia. Her wound on the foot looks about the same perhaps slightly better. There is no warmth spreading up her leg but no obvious erythema. her MRI of the foot and ankle and distal tib-fib is not booked for next Friday I discussed this with her in great detail over multiple days. it is likely she has spreading infection upper leg at least involving the distal 25% above the ankle. She knows that if I refer her to orthopedics for infectious disease they are going to recommend amputation and indeed I am not against this myself. We had a good trial at trying to heal the foot which is what she wanted along with antibiotics debridement and HBO however she clearly has spreading infection [probably staph aureus/MRSA]. Nevertheless she once again tells me she wants to wait the left of the MRI. She still makes comments about having her amputation done in [place]. 10/19; arrives today with significant swelling on the lateral right leg. Last culture I did showed Klebsiella. Multidrug-resistant. Cipro was intermediate sensitivity and that is what I have her on pending her MRI which apparently is going to be done on Thursday this week although this seems to be moving back and forth. She is not systemically unwell. We are using silver alginate on her major wound area on the right medial foot and the draining areas on the right lateral lower leg 10/26; MRI showed extensive abscess in the anterior compartment of the right leg also  widespread osteomyelitis involving osseous structures of the midfoot and portions of the hindfoot. Also suspicion for osteomyelitis anterior aspect of the distal medial malleolus. Culture I did of the purulence once again showed a multidrug-resistant Klebsiella. I have been in contact with nephrology late last week and she has been started on cefepime at dialysis to replace the vancomycin We sent a copy of her MRI report to Dr. Geroge Baseman in [place] who is an orthopedic surgeon. The patient takes great stock  in his opinion on this. She says she will go to [place] to have her leg amputated if Dr. Geroge Baseman does not feel there is any salvage options. 11/2; she still is not talk to her orthopedic surgeon in [place]. Apparently he will call her at [phone] this afternoon. The quality of this is she has not allowed me to refer her anywhere. She has been told over and over that she needs this amputated but has not agreed to be referred. She tells me her blood sugar was 600 last night but she has not been febrile. 11/9; she never did got a call from the orthopedic surgeon in [place] therefore that is off the radar. We have arranged to get her see orthopedic surgery at [facility] She still has a lot of draining purulence coming out of the new abscess in her right leg although that probably came from the osteomyelitis in her right foot and heel. Meanwhile the original wound on the right foot looks very healthy. Continued improvement. The issue is that the last MRI showed osteomyelitis in her right foot extensively she now has an abscess in the right anterior lower leg. There is nobody in [place] who will offer this woman anything but an amputation and to be honest that is probably what she needs. I think she still wants to talk about limb salvage although at this point I just do not see that. She has completed her vancomycin at dialysis which was for the original staph aureus she is still on cefepime for  the more recent Klebsiella. She has had a long course of both of these antibiotics which should have benefited the osteomyelitis on the right foot as well as the abscess. 11/16; apparently [place] elective surgery is shut down because of COVID-19 pandemic. I have reached out to some contacts at [facility] to see if we can get her an orthopedic appointment there. I am concerned about continually leaving this but for the moment everything is static. In fact her original large wound on this foot is closing down. It is the abscess on the right anterior leg that continues to drain purulent serosanguineous material. She is not currently on any antibiotics however she had a prolonged course of vancomycin [1 month] as well as cefepime for a month 02/24/2019 on evaluation today patient appears to be doing better than the last time I saw her. This is not a patient that I typically see. With that being said I am covering for Dr. Dellia Nims this week and again compared to when I last saw her overall the wounds in particular seem to be doing significantly better which is good news. With that being said the patient tells me several disconcerting things. She has not been able to get in to see anyone for potential debridement in regard to her leg wounds although she tells me that she does not think it is necessary any longer because she is taking care of that herself. She noticed a string coming out of the lower wound on her leg over the last week. The patient states that she subsequently decided that we must of pack something in there and started pulling the string out and as it kept coming and coming she realized this was likely her tendon. With that being said she continued to remove as much of this as she could. She then I subsequently proceeded to using tubes of antibiotic ointment which she will stick down into the wound and then scored as much as she can until  she sees it coming out of the other wound opening. She  states that in doing this she is actually made things better and there is less redness and irritation. With regard to her foot wound she does have some necrotic tendon and tissue noted in one small corner but again the actual wound itself seems to be doing better with good granulation in general compared to my last evaluation. 12/7; continued improvement in the wound on the substantial part of the right medial foot. Still a necrotic area inferiorly that required debridement but the rest of this looks very healthy and is contracting. She has 2 wounds on the right lateral leg which were her original drainage sites from her abscess but all of this looks a lot better as well. She has been using silver alginate after putting antibiotic biotic ointment in one wound and watching it come out the other. I have talked to her in some detail today. I had given her names of orthopedic surgeons at [facility] for second opinion on what to do about the right leg. I do not think the patient never called them. She has not been able to get a hold of the orthopedic surgeon in [place] that she had put a lot of faith in as being somebody would give her an opinion that she would trust. I talked to her today and said even if I could get her in to another orthopedic surgeon about the leg which she accept an amputation and she said she would not therefore I am not going to press this issue for the moment 12/14; continued improvement in his substantial wound on the right medial foot. There is still a necrotic area inferiorly with tightly adherent necrotic debris which I have been working on debriding each time she is here. She does not have an orthopedic appointment. Since last time she was here I looked over her cultures which were essentially MRSA on the foot wound and gram-negative rods in the abscess on the anterior leg. 12/21; continued improvement in the area on the right medial foot. She is not up on this much and that  is probably a good thing since I do not know it could support continuous ambulation. She has a small area on the right lateral leg which were remanence of the IandD's I did because of the abscess. I think she should probably have prophylactic antibiotics I am going to have to look this over to see if we can make an intelligent decision here. In the meantime her major wound is come down nicely. Necrotic area inferiorly is still there but looks a lot better 04/06/2019; she has had some improvement in the overall surface area on the right medial foot somewhat narrowedr both but somewhat longer. The areas on the right lateral leg which were initial IandD sites are superficial. Nothing is present on the right heel. We are using silver alginate to the wound areas 1/18; right medial foot somewhat smaller. Still a deep probing area in the most distal recess of the wound. She has nothing open on the right leg. She has a new wound on the plantar aspect of her left fourth toe which may have come from just pulling skin. The patient using Medihoney on the wound on her foot under silver alginate. I cannot discourage her from this 2/1; 2-week follow-up using silver alginate on the right foot and her left fourth toe. The area on the right dorsal foot is contracted although there is still the deep  area in the most distal part of the wound but still has some probing depth. No overt infection 2/15; 2-week follow-up. She continues to have improvement in the surface area on the dorsal right foot. Even the tunneling area from last time is almost closed. The area that was on the plantar part of her left fourth toe over the PIP is indeed closed 3/1; 2-week follow-up. Continued improvement in surface area. The original divot that we have been debriding inferiorly I think has full epithelialization although the epithelialization is gone down into the wound with probably 4 mm of depth. Even under intense illumination I am unable  to see anything open here. The remanence of the wound in this area actually look quite healthy. We have been using silver alginate 3/15; 2-week follow-up. Unfortunately not as good today. She has a comma shaped wound on the dorsal foot however the upper part of this is larger. Under illumination debris on the surface She also tells [place] that she was on her right leg 2 times in the last couple of weeks mostly to reach up for things above her head etc. She felt a sharp pain in the right leg which she thinks is somewhere from the ankle to the knee. The patient has neuropathy and is really uncertain. She cannot feel her foot so she does not think it was coming from there 3/29; 2-week follow-up. Her wound measures smaller. Surface of the wound appears reasonable. She is using silver alginate with underlying Medihoney. She has home health. X-rays I did of her tib-fib last time were negative although it did show arterial calcification 4/12; 2-week follow-up. Her wound measures smaller in length. Using manuka honey with silver alginate on top. She has home health. 4/26; 2-week follow-up. Her wound is smaller but still very adherent debris under illumination requiring debridement she has been using manuka honey with silver alginate. 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 Been using Hydrofera Blue. She is on suppressive Bactrim but apparently she has been taking it twice daily 6/7; I have not seen this wound and about 6 weeks. Since then she was up in [place]. By her own admission she was walking on the foot because she did not have a wheelchair. The wound is not nearly as healthy looking as it was the last time I saw this. We ordered different things for her but she only uses Medihoney and silver alginate. As far as I know she is on suppressive trimethoprim sulfamethoxazole. She does not admit to any  fever or chills. Her CBGs apparently are at baseline however she is saying that she feels some discomfort on the lateral part of her ankle I looked over her last inflammatory markers from the summer 2020 at which time she had a deeply necrotic infected wound in this area. On 11/10/2018 her sedimentation rate was 56 and C-reactive protein 9.9. This was 107 and 29 on 07/29/2018. 6/17; the patient had a necrotic wound the last time she was here on the right dorsal foot. After debridement I did a culture. This showed a very resistant ESBL Klebsiella as well as Enterococcus. Her x-ray of the foot which was done because of warmth and some discomfort showed bone destruction within the carpal bones involving the navicular acute cuboid lateral middle cuneiforms but essentially unchanged from her prior study which was done on 10/29/2018. The findings were felt  to represent chronic osteomyelitis. We did inflammatory markers on her. Her white count was 5.25 sedimentation rate 16 and C-reactive protein at 11.1. Notable for the fact that in August 2020 her CRP was 9.9 and sedimentation rate 56. I have looked at her x-rays. It is true that the bone destruction is very impressive however the patient came into this clinic for the wound on her right foot with pieces of bone literally falling out anteriorly with purulent material. I am not exactly sure I could have expected anything different. She has not been systemically unwell no fever chills or blood sugars have been reasonable. 6/28; she arrives with a right heel closed. The substantial area on the right anterior foot looks healthy. Much better looking surface. I think we can change to [facility] seems to help this previously. She is getting her antibiotics at dialysis she should be just about finished 7/9; changed to [facility] last week. Surface wound looks satisfactory not much change in surface area however. She is going to [place] state next week this  is usually a difficult thing for this patient follow-up will be for 2 weeks. 7/23; using Hydrofera Blue. She returns from her trip and the wound looks surprisingly good. Usually when this patient goes on trips she comes back with a lot of problems with the wound. She is saying that she sometimes feels an episodic "crunching" feeling on the lateral part of the foot. She is neuropathic and not feeling pain but wonders whether this could be a neuropathic dysesthesia. 11/13/19-Patient returns after 3 weeks, the wound itself is stable and patient states that there is nothing new going on she is on some extra anxiety medications and is resisting the temptation to pick at the dry skin around the wound. 9/20; patient has not been here in over a month and I have not seen her in 2 months. The wound in terms of size I think is about the same. There is no exposed bone. She has a nonviable surface on this. She is supposed to be using [facility] however she is also been using some form of honey preparation as well as a silver-based dressings. I do not think she has any pattern to this. 10/4; 2-week follow-up. Patient has been using some form of spray which she says has honey and silver to purchase this online she has been covering it with gauze. In spite of this the wound actually looks quite good. The deeper divot distally appears to be close down. There is a rim of epithelialization. 10/18; 2-week follow-up. Patient has been using her Hydrofera Blue covered with her silver honey spray that she got online. 11/1; 2-week follow-up. She is using Hydrofera Blue with a silver honey spray. Wound bed is measuring smaller. She has noticed that her foot is warmer on the right. She is concerned about infection. For a long period of time I had her on prophylactic trimethoprim sulfamethoxazole DS 1 tablet daily. She is asking for this to be restarted. The patient is walking on this foot because of repairs that are being  done in a home her but her room is on the second floor she has to go up and down stairs. I have cautioned against this however as usual she will do exactly what she wants to do 11/15; 2-week follow-up. She uses Hydrofera Blue with a silver/honey spray which I have never heard of. I think her wound looks about the same. Some epithelialization. No evidence that this is infected.  I think she is walking on this more than we agreed on. She is going on extensive vacation over Thanksgiving 12/3; 2-week follow-up. She is using Hydrofera Blue however over Thanksgiving she ran out of this and she is simply been using Medihoney. In spite of this her wound is smaller almost divided into 2 now. She traveled extensively over Thanksgiving and actually looks quite good in spite of this. Usually this is been a marker of problems for her 12/17; 2-week follow-up. She is using Hydrofera Blue. The wound is smaller. Debris on the surface of this is fibrinous. She is traveling to [place] over the holidays which never bodes well for her wounds. I think she is walking more on her feet then she is even willing to admit and she tells me she does walk 2/11; using Hydrofera Blue. Her wounds are contracting however she walks in the clinic with a history that she has not been able to eat she has had vomiting. She also has right eye problems. Her blood sugar was 567. She had blood work done at dialysis and we called there to get her blood work although it still had not returned although they should have this by the end of the day we were told. She also stated that she was not sure what her blood sugar was as her glucose monitor was not working and not coming to tomorrow. She also for some reason does not think her insulin pump is working well. Her endocrinologist is Dr. Elayne Snare 06/03/2020 upon evaluation today patient actually appears to be doing excellent in regard to her wound. In fact she tells me it was not even bleeding until  she picked a dry piece of skin off while we were getting ready to come in and see her today. Fortunately there is no signs of active infection which is great news and overall very pleased. She is going to be seeing her neurologist sometime shortly I believe it might even be on Monday. Nonetheless there can proceed with the work-up as far as anything else going on with her eye the fact that her right eyelid is drooping. 3/25; right medial dorsal foot. She has superficial areas here that appear to be fully epithelialized she is using Medihoney and silver alginate and some combination. She has a new area on the mid left foot at roughly the level of the fifth metatarsal base 4/84/8; right medial dorsal foot. Still superficial areas here. I cleaned up 1 of these today she has been using I think mostly Medihoney but I would like her to use silver alginate. The area on the mid part of her foot also looks improved on the left. Since she was last here she tells me that she noted pus in the area of her left foot. She told dialysis about this they did a culture and ultimately she is on IV antibiotics but were not sure which one. They referred her to [name] he said that he did not need to follow her. I have not really verified any of this and I do not know what antibiotic she is on at dialysis 4/29; patient has been using Medihoney to her wounds. She reports taking off the toenail to her left second toe. She states that home health discharged her and she has not had help with dressing changes. She denies any signs and symptoms of infection. Electronic Signature(s) Signed: 07/29/2020 2:04:46 PM By: Kalman Shan DO Entered By: Kalman Shan on 07/29/2020 13:59:59 -------------------------------------------------------------------------------- Physical Exam  Details Patient Name: Date of Service: [name] 07/29/2020 12:30 PM Medical Record Number: [id number] Patient Account Number:  [id number] Date of Birth/Sex: Treating RN: 1970-10-23 (50 y.o. [name] Primary Care Provider: [name], [name] Other Clinician: Referring Provider: Treating Provider/Extender: Kalman Shan MO REIRA, [name] Weeks in Treatment: 55 Constitutional respirations regular, non-labored and within target range for patient.. Cardiovascular 2+ dorsalis pedis/posterior tibialis pulses. Psychiatric pleasant and cooperative. Notes Left plantar foot wound: There is a wound with circumferential callus. No signs of infection. Granulation tissue was present when callus removed. Right dorsal foot wound: This is limited to skin breakdown. No signs of infection. Left second toe: Erythematous and appears to be a pressure injury. Epidermis layer disrupted. Electronic Signature(s) Signed: 07/29/2020 2:04:46 PM By: Kalman Shan DO Entered By: Kalman Shan on 07/29/2020 14:00:26 -------------------------------------------------------------------------------- Physician Orders Details Patient Name: Date of Service: [name] 07/29/2020 12:30 PM Medical Record Number: [id number] Patient Account Number: [id number] Date of Birth/Sex: Treating RN: 1970/11/27 (50 y.o. [name] Primary Care Provider: [name], [name] Other Clinician: Referring Provider: Treating Provider/Extender: Kalman Shan MO REIRA, [name] Weeks in Treatment: 75 Verbal / Phone Orders: No Diagnosis Coding ICD-10 Coding Code Description L97.514 Non-pressure chronic ulcer of other part of right foot with necrosis of bone E10.621 Type 1 diabetes mellitus with foot ulcer L97.529 Non-pressure chronic ulcer of other part of left foot with unspecified severity L97.521 Non-pressure chronic ulcer of other part of left foot limited to breakdown of skin Follow-up Appointments ppointment in 2 weeks. - with Dr. Dellia Nims Return A Bathing/ Shower/ Hygiene May shower and wash wound with soap and  water. Edema Control - Lymphedema / SCD / Other Bilateral Lower Extremities Elevate legs to the level of the heart or above for 30 minutes daily and/or when sitting, a frequency of: - throughout the day Avoid standing for long periods of time. Moisturize legs daily. - with dressing changes Off-Loading Open toe surgical shoe to: - to both feet Other: - minimal weight bearing right foot Home Health New wound care orders this week; continue Home Health for wound care. May utilize formulary equivalent dressing for wound treatment orders unless otherwise specified. - New wound to left 2nd toe. Other Home Health Orders/Instructions: - Interim 3x week Wound Treatment Wound #43 - Foot Wound Laterality: Right, Medial Cleanser: Wound Cleanser (Home Health) Every Other Day/30 Days Discharge Instructions: Cleanse the wound with wound cleanser or normal saline prior to applying a clean dressing using gauze sponges, not tissue or cotton balls. Prim Dressing: MediHoney Gel, tube 1.5 (oz) (Home Health) Every Other Day/30 Days ary Discharge Instructions: Apply to wound bed as instructed Secondary Dressing: Woven Gauze Sponge, Non-Sterile 4x4 in [facility]) Every Other Day/30 Days Discharge Instructions: Apply over primary dressing as directed. Secured With: Elastic Bandage 4 inch (ACE bandage) (Home Health) Every Other Day/30 Days Discharge Instructions: Secure with ACE bandage as directed. Secured With: [employer], 4.5x3.1 (in/yd) [facility]) Every Other Day/30 Days Discharge Instructions: Secure with Kerlix as directed. Secured With: Paper Tape, 2x10 (in/yd) (Home Health) Every Other Day/30 Days Discharge Instructions: Secure dressing with tape as directed. Wound #50 - Foot Wound Laterality: Plantar, Left Cleanser: Wound Cleanser (Home Health) Every Other Day/30 Days Discharge Instructions: Cleanse the wound with wound cleanser or normal saline prior to applying a clean dressing using  gauze sponges, not tissue or cotton balls. Prim Dressing: KerraCel Ag Gelling Fiber  Dressing, 2x2 in (silver alginate) [facility]) Every Other Day/30 Days ary Discharge Instructions: Apply silver alginate to wound bed in clinic. pt may use silver honey spray at home Secondary Dressing: Woven Gauze Sponge, Non-Sterile 4x4 in [facility]) Every Other Day/30 Days Discharge Instructions: Apply over primary dressing as directed. Secured With: Elastic Bandage 4 inch (ACE bandage) (Home Health) Every Other Day/30 Days Discharge Instructions: Secure with ACE bandage as directed. Secured With: [employer], 4.5x3.1 (in/yd) [facility]) Every Other Day/30 Days Discharge Instructions: Secure with Kerlix as directed. Secured With: Paper Tape, 2x10 (in/yd) (Home Health) Every Other Day/30 Days Discharge Instructions: Secure dressing with tape as directed. Wound #51 - T Second oe Wound Laterality: Left Cleanser: Soap and Water [facility]) Every Other Day/15 Days Discharge Instructions: May shower and wash wound with dial antibacterial soap and water prior to dressing change. Prim Dressing: Promogran Prisma Matrix, 4.34 (sq in) (silver collagen) (Home Health) Every Other Day/15 Days ary Discharge Instructions: Moisten collagen with saline or hydrogel Secondary Dressing: Woven Gauze Sponges 2x2 in [facility]) Every Other Day/15 Days Discharge Instructions: Apply over primary dressing as directed. Secured With: Child psychotherapist, Sterile 2x75 (in/in) (Home Health) Every Other Day/15 Days Discharge Instructions: Secure with stretch gauze as directed. Secured With: Paper Tape, 1x10 (in/yd) (Home Health) Every Other Day/15 Days Discharge Instructions: Secure dressing with tape as directed. Electronic Signature(s) Signed: 07/29/2020 2:04:46 PM By: Kalman Shan DO Previous Signature: 07/29/2020 1:23:46 PM Version By: Lorrin Jackson Entered By: Kalman Shan on 07/29/2020  14:00:47 -------------------------------------------------------------------------------- Problem List Details Patient Name: Date of Service: [name] 07/29/2020 12:30 PM Medical Record Number: [id number] Patient Account Number: [id number] Date of Birth/Sex: Treating RN: Nov 11, 1970 (50 y.o. [name] Primary Care Provider: [name], [name] Other Clinician: Referring Provider: Treating Provider/Extender: Kalman Shan MO REIRA, [name] Weeks in Treatment: 65 Active Problems ICD-10 Encounter Code Description Active Date MDM Diagnosis L97.514 Non-pressure chronic ulcer of other part of right foot with necrosis of bone 09/03/2018 No Yes E10.621 Type 1 diabetes mellitus with foot ulcer 09/24/2018 No Yes L97.529 Non-pressure chronic ulcer of other part of left foot with unspecified severity 07/29/2020 No Yes L97.521 Non-pressure chronic ulcer of other part of left foot limited to breakdown of 06/24/2020 No Yes skin Inactive Problems ICD-10 Code Description Active Date Inactive Date M86.671 Other chronic osteomyelitis, right ankle and foot 09/03/2018 09/03/2018 L97.411 Non-pressure chronic ulcer of right heel and midfoot limited to breakdown of skin 09/17/2019 09/17/2019 L97.521 Non-pressure chronic ulcer of other part of left foot limited to breakdown of skin 04/20/2019 04/20/2019 L97.812 Non-pressure chronic ulcer of other part of right lower leg with fat layer exposed 02/24/2019 02/24/2019 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 07/29/2020 12:30 PM Medical Record Number: [id number] Patient Account Number: [id number] Date of Birth/Sex: Treating RN: 1970/12/16 (50 y.o. [name] Primary Care Provider: [name], [name] Other Clinician: Referring Provider: Treating Provider/Extender: Kalman Shan MO REIRA, [name] Weeks in Treatment: 47 Subjective Chief Complaint Information obtained from Patient Significant right foot ulcer with right leg ulcers 4/29: Right  foot ulcer, left plantar ulcer and left second toe wound History of Present Illness (HPI) 50 year old diabetic who is known to have type 1 diabetes which is poorly controlled last hemoglobin A1c was 11%. She comes in with a ulcerated area on the left lateral foot which has been there for over 6 months. Was recently she has been treated by [name] of podiatry who saw her last on 05/28/2016. Review of his notes revealed that the patient had incision and drainage with placement of antibiotic beads to the left foot on 04/11/2016 for possible osteomyelitis of the cuboid bone. Over the last year she's had a history of amputation of the left fifth toe and a femoropopliteal popliteal bypass graft somewhere in April 2017. 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 His note [name] mentions that the patient has been referred to me for further wound care and possibly great candidate for hyperbaric oxygen therapy due to recurrent osteomyelitis. However we do not have any x-rays of biopsy reports confirming this. He has been on several antibiotics including Bactrim and most recently is on doxycycline for an MRSA. I understand, the patient was not a candidate for IV antibiotics as she has had previous PICC lines which resulted in blood clots in both arms. There was a x-ray report dated  04/04/2016 on [name] notes which showed evidence of fifth ray resection left foot with osteolytic changes noted to the fourth metatarsal and cuboid bone on the left. 06/13/2016 -- had a left foot x-ray which showed no acute fracture or dislocation and no definite radiographic evidence of osteomyelitis. Advanced osteopenia was seen. 06/20/2016 -- she has noticed a new wound on the right plantar foot in the region where she had a callus before. 06/27/16- the patient did have her x-ray of the right foot which showed no findings to suggest osteomyelitis. She saw her endocrinologist, [name], yesterday. Her A1c in January was 11. He also indicates mismanagement and noncompliance regarding her diabetes. She is currently on Bactrim for a lip infection. She is complaining of nausea, vomiting and diarrhea. She is unable to articulate the exact orders or dosing of the Bactrim; it is unclear when she will complete this. 07/04/2016 -- results from [facility] of ABIs with ankle waveforms were noted from 02/14/2016. The examination done on 06/27/2015 showed noncompressible ABIs with the right being 1.45 and the left being 1.33. The present examination showed a right ABI of 1.19 on the left of 1.33. The conclusion was that right normal ABI in the lower extremity at rest however compared to previous study which was noncompressible ABI may be falsely elevated side suggesting medial calcification. The left ABI suggested medial calcification. 08/01/2016 -- the patient had more redness and pain on her right foot and did not get to come to see as noted she see her PCP or go to the ER and decided to take some leftover metronidazole which she had at home. As usual, the patient does report she feels and is rather noncompliant. 08/08/2016 -- -- x-ray of the right foot -- FINDINGS:Transmetatarsal amputation is noted. No bony destruction is noted to suggest osteomyelitis. IMPRESSION: No evidence of osteomyelitis.  Postsurgical changes are seen. MRI would be more sensitive for possible bony changes. Culture has grown Serratia Marcescens -- sensitive to Bactrim, ciprofloxacin, ceftazidime she was seen by [name] on 08/06/2016. He did not find any exposed bone, muscle, tendon, ligament or joint. There was no malodor and he did a excisional debridement in  the office. ============ Old notes: 50 year old patient who is known to the wound clinic for a while had been away from the wound clinic since 09/01/2014. Over the last several months she has been admitted to various hospitals including [place] [facility] [place]. She was treated for a right metatarsal osteomyelitis with a transmetatarsal amputation and this was done about 2 months ago. He has a small ulcerated area on the right heel and she continues to have an ulcerated area on the left plantar aspect of the foot. The patient was recently admitted to the [facility] hospital group between 7/12 and 10/18/2014. she was given 3 weeks of IV vancomycin and was to follow-up with her surgeons at [facility] and also took oral vancomycin for C. difficile colitis. 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 MRI of the left foot done on 10/14/2014 showed no abscess or osteomyelitis. 04/27/15; this is a patient we know from previous stays in the wound care center. She is a type I diabetic I am not sure of her control currently. Since the last time I saw her she is had a right transmetatarsal amputation and has no wounds on her right foot and has no open wounds. She is been followed at the wound care center at [facility] in [place]. She comes today with the desire to undergo hyperbaric  treatment locally. Apparently one of her wound care providers in [place] has suggested hyperbarics. This is in response to an MRI from 04/18/15 that showed increased marrow signal and loss of the proximal fifth metatarsal cortex evidence of osteomyelitis with likely early osteomyelitis in the cuboid bone as well. She has a large wound over the base of the fifth metatarsal. She also has a eschar over her the tips of her toes on 1,3 and 5. She does not have peripheral pulses and apparently is going for an angiogram tomorrow which seems reasonable. After this she is going to infectious disease at [facility]. They have been using Medihoney to the large wound on the lateral aspect of the left foot to. The patient has known Charcot deformity from diabetic neuropathy. She also has known diabetic PAD. Surprisingly I can't see that she has had any recent antibiotics, the patient states the last antibiotic she had was at the end of November for 10 days. I think this was in response to culture that showed group G strep although I'm not exactly sure where the culture was from. She is also had arterial studies on 03/29/15. This showed a right ABI of 1.4 that was noncompressible. Her left ABI was 0.73. 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 It was not felt that arterial inflow was adequate for healing of a foot ulcer. Her Doppler waveforms looked monophasic ===== READMISSION 02/28/17; this is in an now 50 year old woman we've had at several different occasions in this clinic. 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 She has a remote ex-smoker. She was last seen in this clinic by Dr. Con Memos I think in May. More recently she is been followed by her podiatrist [name] an infectious disease Dr. Megan Salon. She has 2 open wounds the major one is over the right first metatarsal head she also has a wound on the left plantar foot.  an MRI of the  right foot on 01/01/17 showed a soft tissue ulcer along the plantar aspect of the first metatarsal base consistent with osteomyelitis of the first metatarsal stump. Dr. 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 According to the patient this is been open for slightly over a month. She has been on a combination of Cipro 500 twice a day, Zyvox 600 twice a day and Flagyl 500 3 times a day for over a month now as directed by Dr. Megan Salon. cultures of the right foot earlier this year showed MRSA in January and Serratia in May. January also had a few viridans strep. Recent x-rays of both feet were done and [name] office and I don't have these reports. The patient has known PAD and has a history of aleft femoropopliteal bypass in April 2017. 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 Dwyane Dee. In spite of this the last hemoglobin A1c I can see is 10.1 on 01/01/2017. She is being referred by 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 She is been using Medihoney to this area and also an area on the left midfoot. She is using healing sandals bilaterally. ABIs in this clinic at the left posterior tibial was 1.1 noncompressible on the right READMISSION Non invasive vascular NOVANT 5/18 Aftercare following surgery of the circulatory system Procedure Note - Interface, External Ris In - 08/13/2016 11:05 AM EDT Procedure: Examination consists of physiologic resting arterial pressures of the brachial and ankle arteries bilaterally with continuous wave Doppler waveform analysis. Previous: Previous exam performed on 02/14/16 demonstrated ABIs of Rt = 1.19 and Lt = 1.33. Right: ABI = non-compressible PT  1.47 DP. S/P transmet amputation. , Left: ABI = 1.52, 2nd digit pressure = 87 mmHg Conclusions: Right: ABI (>1.3) may be falsely elevated, suggesting medial calcification. Left: ABI (>1.3) may be falsely elevated, suggesting medial calcification The patient is a now 50 year old type I diabetic is had multiple issues her graded to chronic diabetic foot ulcers. 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 We had her in the clinic lastin November. At that point she had wounds on her bilateral feet.she had wanted to try hyperbarics however the healogics review process denied her because she hadn't followed up with her vascular surgeon for her left femoropopliteal bypass. The bypass was done by Dr. Raul Del at [facility]. We made her a follow-up with Dr. Raul Del however she did not keep the appointment and therefore she was not approved The patient shows me a small wound on her left fourth metatarsal head on her phone. She developed rapid discoloration in the plantar aspect of the left foot and she was admitted to hospital from 2/2 through 05/10/17 with wet gangrene of the left foot osteomyelitis of the fourth metatarsal heads. She was admitted acutely ill with a temperature of 103. She was started on broad-spectrum vancomycin and cefepime. On 05/06/17 she was taken to the OR by [name] her podiatric surgeon for an incision and drainage irrigation of the left foot wound. Cultures from this surgery revealed group be strep and anaerobes. she was seen by Dr.Xu of orthopedic surgery and scheduled for a below-knee amputation which  she u refused. Ultimately she was discharged on Levaquin and Flagyl for one month. MRI 05/05/17 done while she was in the hospital showed abscess adjacent to the fourth metatarsal head and neck small abscess around the fourth flexor tendon. Inflammatory phlegmon and gas in the soft tissues along the  lateral aspect of the fourth phalanx. Findings worrisome for osteomyelitis involving the fourth proximal and middle phalanx and also the third and fourth metatarsals. Finally the patient had actually shortly before this followed up with Dr. Raul Del at no time on 04/29/17. He felt that her left femoropopliteal bypass was patent he felt that her left-sided toe pressures more than adequate for healing a wound on the left foot. This was before her acute presentation. 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 05/28/17; she is started hyperbarics. The patient tells me that for some reason she was not actually on Levaquin but I think on ciprofloxacin. She was on Flagyl. She only started her Levaquin yesterday due to some difficulty with the pharmacy and perhaps her sister picking it up. She has an appointment with [name] tomorrow and with infectious disease early next week. She has no new complaints 06/06/17; the patient continues in hyperbarics. She saw [name] on 05/29/17 who is her podiatric surgeon. He is elected for a transmetatarsal amputation on 06/27/17. I'm not sure at what level he plans to do this amputation. The patient is unaware ooShe also saw Dr. Megan Salon of infectious disease who elected to continue her on current antibiotics I think this is ciprofloxacin and Flagyl. I'll need to clarify with her tomorrow if she actually has this. We're using silver alginate to the actual wound. Necrotic surface today with material under the flap of her foot. ooOriginal MRI showed abscesses as well as osteomyelitis of the proximal and middle fourth phalanx and the third and fourth metatarsal heads 06/11/17; patient continues in hyperbarics and continues on oral antibiotics. She is doing well. The wound looks better. The necrotic part of this under the flap in her superior foot also looks better. she is been to see [name]. I haven't had a chance to look at his note. Apparently he has put the  transmetatarsal amputation on hold her request it is still planning to take her to the OR for debridement and product application ACEL. I'll see if I can find his note. I'll therefore leave product ordering/requests to [name] for now. I was going to look at Dermagraft 06/18/17-she is here in follow-up evaluation for bilateral foot wounds. She continues with hyperbaric therapy. She states she has been applying manuka honey to the right plantar foot and alternate manuka honey and silver alginate to the left foot, despite our orders. We will continue with same treatment plan and she will follo up next week. 06/25/17; I have reviewed [name] last note from 3/11. She has operative debridement in 2 days' time. By review his note apparently they're going to place there is skin over the majority of this wound which is a good choice. She has a small satellite area at the most proximal part of this wound on the left plantar foot. The area on the right plantar foot we've been using silver alginate and it is close to healing. 07/02/17; unfortunately the patient was not easily approved for [name] proposed surgery. I'm not completely certain what the issue is. She has been using silver alginate to the wound she has completed a first course of hyperbarics. She is still on Levaquin and Flagyl.  I have really lost track of the time course here.I suspect she should have another week to 2 of antibiotics. I'll need to see if she is followed up with infectious disease Dr. Megan Salon 07/09/17; the patient is followed up with Dr. Megan Salon. 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 She continues on Levaquin and metronidazole continuing both of these for now I think she is been on fr about 6 weeks. She still has some drainage but no pain. No fever. Her had been plans for her to go to the OR for operative debridement with her podiatrist [name], I am not exactly sure where that is.  I'll probably slip a note to [name] today. I note that she follows with Dr. Dwyane Dee of endocrinology. We have her recertified for hyperbaric oxygen. I have not heard about Dermagraft however I'll see if [name] is planning a skin substitute as well 07/16/17; the patient tells me she is just about out of [place]. I'll need to check Dr. Hale Bogus last notes on this. She states she has plenty of Flagyl however. She comes in today complaining of pain in the right lateral foot which she said lasted for about a day. The wound on the right foot is actually much more medially. She also tells me that the [facility] cost a lot of pain in the left foot wound and she turned back to silver alginate. Finally Dermagraft has a $161 per application co-pay. She cannot afford this 07/23/17; patient arrives today with the wound not much smaller. There is not much new to add. She has not heard from [name] all try to put in a call to them today. She was asking about Dermagraft again and she has an over $096 per application co-pay she states that she would be willing to try to do a payment plan. I been tried to avoid this. We've been using silver alginate, I'll change to [facility] 07/30/17-She is here in follow-up evaluation for left foot ulcer. She continues hyperbaric medicine. The left foot ulcer is stable we will continue with same treatment plan 08/06/17; she is here for evaluation of her left foot ulcer. Currently being treated for hyperbarics or underlying osteomyelitis. She is completed antibiotics. The left foot ulcer is better smaller with healthier looking granulation. For various reasons I am not really clear on we never got her back to the OR with [name]. He did not respond to my secure text message. Nevertheless I think that surgery on this point is not necessary nor am I completely clear that a skin substitute is necessary The patient is complaining about pain on the outside of her right foot.  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 There is no erythema. She also states the foot is warm versus her other part of her upper leg and this is largely true. It is not totally clear to me what's causing this. She thinks it's different from her usual neuropathy pain 08/13/17; she arrives in clinic today with a small wound which is superficial on her right first metatarsal head. 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 She tells [place] she was up on her feet over the Mother's Day celebration. ooThe large wound is on the left foot. Continues with hyperbarics for underlying osteomyelitis. We're using Hydrofera Blue. She asked me today about where we were with Dermagraft. I had actually excluded this because of the co-pay however she wants to  assume this therefore I'll recheck the co-pay an order for next week. 08/20/17; the patient agreed to accept the co-pay of the first Apligraf which we applied today. She is disappointed she is finishing hyperbarics will run this through the insurance on the extent of the foot infection and the extent of the wound that she had however she is already had 60 dive's. Dermagraft No. 1 08/27/17; Dermagraft No. 2. She is not eligible for any more hyperbaric treatments this month. She reports a fair amount of drainage and she actually changed to the external dressings without disturbing the direct contact layer 09/03/17; the patient arrived in clinic today with the wound superficially looking quite healthy. Nice vibrant red tissue with some advancing epithelialization although not as much adherence of the flap as I might like. However she noted on her own fourth toe some bogginess and she brought that to our attention. Indeed this was boggy feeling like a possibility of subcutaneous fluid. 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 She is not been unwell. We've been using  Dermagraft 09/10/17; the culture that I did not last week was MRSA. She saw Dr. Megan Salon this morning who is going to start her on vancomycin. I had sent him a secure a text message yesterday. I also spoke with her podiatric surgeon [name] about surgery on this foot the options for conserving a functional foot etc. Promised me he would see her and will make back consultation today. Paradoxically her actual wound on the plantar aspect of her left foot looks really quite good. I had given her 5 days worth of Baxdella to cover her for MRSA. Her MRI came back showing osteomyelitis within the third metatarsal shaft and head and base of the third and fourth proximal phalanx. She had extensive inflammatory changes throughout the soft tissue of the lateral forefoot. With an ill-defined fluid around the fourth metatarsal extending into the plantar and dorsal soft tissues 09/19/17; the patient is actually on oral Septra and Flagyl. She apparently refused IV vancomycin. She also saw [name] at my request who is planning her for a left BKA sometime in mid July. MRI showed osteomyelitis within the third metatarsal shaft and head and the basis of the third and fourth proximal phalanx. I believe there was felt to be possible septic arthritis involving the third MTP. 09/26/17; the patient went back to Dr. Megan Salon at my suggestion and is now receiving IV daptomycin. Her wound continues to look quite good making the decision to proceed with a transmetatarsal amputation although more difficult for the patient. I believe in my extensive discussions with her she has a good sense of the pros and cons of this. I don't NV the tuft decision she has to make. She has an appointment with [name] I believe in mid July and I previously spoken to him about this issue Has we had used 3 previous Dermagraft. Given the condition of the wound surface I went ahead and added the fourth one today area and I did this not  fully realizing that she'll be traveling to [place] next week. I'm hopeful she can come back in 2 weeks 10/21/17; Her same Dermagraft on for about 3-1/2 weeks. In spite of this the wound arrives looking quite healthy. There is been a lot of healing dimensions are smaller. Looking at the square shaped wound she has now there is some undermining and some  depth medially under the undermining although I cannot palpate any bone. No surrounding infection is obvious. She has difficult questions about how to look at this going forward vis--vis amputations versus continued medical therapy. T be truthful the wound is looks so o healthy and it is continued to contract. Hard to justify foot surgery at this point although I still told her that I think it might come to that if we are not able to eradicate the underlying MRSA. She is still highly at risk and she understands this 11/06/17 on evaluation today patient appears to be doing better in regard to her foot ulcer. She's been tolerating the dressing changes without complication. Currently she is here for her Dermagraft #6. Her wound continues to make excellent progress at this point. She does not appear to have any evidence of infection which is good news. 11/13/17 on evaluation today patient appears to be doing excellent at this time. She is here for repeat Dermagraft application. This is #7. Overall her wound seems to be making great progress. 12/05/17; the patient arrives with the wound in much better condition than when I last saw this almost 6 weeks ago. She still has a small probing area in the left metatarsal head region on the lateral aspect of her foot. We applied her last Dermagraft today. ooSince the last time she is here she has what appears to a been a blood blister on the plantar aspect of left foot although I don't see this is threatening. There is also a thick raised tissue on the right mid metatarsal head region. This was not there I don't think  the last time she was here 3 weeks ago. 12/12/17; the patient continues to have a small programming area in the left metatarsal head region on the lateral aspect of her foot which was the initial large surgical wound. I applied her last Apligraf last week. I'm going to use Endoform starting today ooUnfortunately she has an excoriated area in the left mid foot and the right mid foot. The left midfoot looks like a blistered area this was not opened last week it certainly is open today. Using silver alginate on these areas. She promises me she is offloading this. 12/19/17; the small probing area in the left metatarsal head eyes think is shallower. In general her original wound looks better. We've been using Endoform. The area inferiorly that I think was trauma last week still requires debridement a lot of nonviable surface which I removed. She still has an open open area distally in her foot ooSimilarly on the right foot there is tightly adherent surface debris which I removed. Still areas that don't look completely epithelialized. This is a small open area. We used silver alginate on these areas 12/26/2017; the patient did not have the supplies we ordered from last week including the Endoform. The original large wound on the left lateral foot looks healthy. She still has the undermining area that is largely unchanged from last week. She has the same heavily callused raised edged wounds on the right mid and left midfoot. Both of these requiring debridement. We have been using silver alginate on these areas 01/02/2018; there is still supply issues. We are going to try to use Prisma but I am not sure she actually got it from what she is saying. She has a new open area on the lateral aspect of the left fourth toe [previous fifth ray amputation]. Still the one tunneling area over the fourth metatarsal head. The area  is in the midfoot bilaterally still have thick callus around them. She is concerned about a  raised swelling on the lateral aspect of the foot. However she is completely insensate 01/10/2018; we are using Prisma to the wounds on her bilateral feet. Surprisingly the tunneling area over the left fourth metatarsal head that was part of her original surgery has closed down. She has a small open area remaining on the incision line. 2 open areas in the midfoot. 02/10/2018; the patient arrives back in clinic after a month hiatus. She was traveling to visit family in [place]. Is fairly clear she was not offloading the areas on her feet. The original wound over the left lateral foot at the level of metatarsal heads is reopened and probes medially by about a centimeter or 2. She notes that a week ago she had purulent drainage come out of an area on the left midfoot. Paradoxically the worst area is actually on the right foot is extensive with purulent drainage. We will use silver alginate today 02/17/2018; the patient has 3 wounds one over the left lateral foot. She still has a small area over the metatarsal heads which is the remnant of her original surgical wound. This has medial probing depth of roughly 1.4 cm somewhat better than last week. The area on the right foot is larger. We have been using silver alginate to all areas. The area on the right foot and left foot that we cultured last week showed both Klebsiella and Proteus. Both of these are quinolone sensitive. The patient put her's self on Bactrim and Flagyl that she had left hanging around from prior antibiotic usages. She was apparently on this last week when she arrived. I did not realize this. Unfortunately the Bactrim will not cover either 1 of these organisms. We will send in Cipro 500 twice daily for a week 03/04/2018; the patient has 2 wounds on the left foot one is the original wound which was a surgical wound for a deep DFU. At one point this had exposed bone. She still has an area over the fourth metatarsal head that probes about  1.4 cm although I think this is better than last week. I been using silver nitrate to try and promote tissue adherence and been using silver alginate here. ooShe also has an area in the left midfoot. This has some depth but a small linear wound. Still requiring debridement. ooOn the right midfoot is a circular wound. A lot of thick callus around this area. ooWe have been using silver alginate to all wound areas ooShe is completed the ciprofloxacin I gave her 2 weeks ago. 03/11/2018; the patient continues to have 2 open areas on the left foot 1 of which was the original surgical wound for a deep DFU. Only a small probing area remains although this is not much different from last week we have been using silver alginate. The other area is on the midfoot this is smaller linear but still with some depth. We have been using silver alginate here as well ooOn the right foot she has a small circular wound in the mid aspect. This is not much smaller than last time. We have been using silver alginate here as well 03/18/2018; she has 3 wounds on the left foot the original surgical wound, a very superficial wound in the mid aspect and then finally the area in the mid plantar foot. She arrives in today with a very concerning area in the wound in the mid plantar foot  which is her most proximal wound. There is undermining here of roughly 1-1/2 cm superiorly. Serosanguineous drainage. She tells me she had some pain on for over the weekend that shot up her foot into her thigh and she tells me that she had a nodule in the groin area. ooShe has the single wound in the right foot. ooWe are using endoform to both wound areas 03/24/2018; the patient arrives with the original surgical wound in the area on the left midfoot about the same as last week. There is a collection of fluid under the surface of the skin extending from the surgical wound towards the midfoot although it does not reach the midfoot wound. The  area on the right foot is about the same. Cultures from last week of the left midfoot wound showed abundant Klebsiella abundant Enterococcus faecalis and moderate methicillin resistant staph I gave her Levaquin but this would have only covered the Klebsiella. She will need linezolid 04/01/2018; she is taking linezolid but for the first few days only took 1 a day. I have advised her to finish this at twice daily dosing. In any case all of her wounds are a lot better especially on the left foot. The original surgical wound is closed. The area on the left midfoot considerably smaller. The area on the right foot also smaller. 04/08/2018; her original surgical wound/osteomyelitis on the left foot remains closed. She has area on the left foot that is in the midfoot area but she had some streaking towards this. This is not connected with her original wound at least not visually. ooSmall wound on the right midfoot appears somewhat smaller. 04/15/18; both wounds looks better. Original wound is better left midfoot. Using silver alginate 1/21; patient states she uses saltwater soak in, stones or remove callus from around her wounds. She is also concerned about a blood blister she had on the left foot but it simply resolved on its own. We've been using silver alginate 1/28; the patient arrives today with the same streaking area from her metatarsals laterally [the site of her original surgical wound] down to the middle of her foot. There is some drainage in the subcutaneous area here. This concerns me that there is actually continued ongoing infection in the metatarsals probably the fourth and third. This fixates an MRI of the foot without contrast [chronic renal failure] ooThe wound in the mid part of the foot is small but I wonder whether this area actually connects with the more distal foot. ooThe area on the right midfoot is probably about the same. Callus thick skin around the small wound which I removed  with a curette we have been using silver alginate on both wound areas 2/4; culture I did of the draining site on the left foot last time grew methicillin sensitive staph aureus. MRI of the left foot showed interval resolution of the findings surrounding the third metatarsal joint on the prior study consistent with treated osteomyelitis. Chronic soft tissue ulceration in the plantar and lateral aspect of the forefoot without residual focal fluid collection. No evidence of recurrent osteomyelitis. 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 She tells me she had nausea and vomiting over the weekend and missed 2 days. In general her foot looks much the  same. She has a small open area just below the left fourth metatarsal head. A linear area in the left midfoot. Some discoloration extending from the inferior part of this into the left lateral foot although this appears to be superficial. She has a small area on the right midfoot which generally looks smaller after debridement 2/18; the patient is completing his cephalexin and has another 2 days. She continues to have open areas on the left and right foot. 2/25; she is now off antibiotics. The area on the left foot at the site of her original surgical wound has closed yet again. She still has open areas in the mid part of her foot however these appear smaller. The area on the right mid foot looks about the same. We have been using silver alginate She tells me she had a serious hypoglycemic spell at home. She had to have EMS called and get IV dextrose 3/3; disappointing on the left lateral foot large area of necrotic tissue surrounding the linear area. This appears to track up towards the same original surgical wound.  Required extensive debridement. The area on the right plantar foot is not a lot better also using silver 3/12; the culture I did last time showed abundant enterococcus. I have prescribed Augmentin, should cover any unrecognized anaerobes as well. In addition there were a few MRSA and Serratia that would not be well covered although I did not want to give her multiple antibiotics. She comes in today with a new wound in the right midfoot this is not connected with the original wound over her MTP a lot of thick callus tissue around both wounds but once again she said she is not walking on these areas 3/17-Patient comes in for follow-up on the bilateral plantar wounds, the right midfoot and the left plantar wound. Both these are heavily callused surrounding the wounds. We are continuing to use silver alginate, she is compliant with offloading and states she uses a wheelchair fairly often at home 3/24; both wound areas have thick callus. However things actually look quite a bit better here for the majority of her left foot and the right foot. 3/31; patient continues to have thick callused somewhat irritated looking tissue around the wounds which individually are fairly superficial. There is no evidence of surrounding infection. We have been using silver alginate however I change that to [facility] today 4/17; patient returns to clinic after having a scare with Covid she tested negative in her primary doctor's office. She has been using Hydrofera Blue. She does not have an open area on the right foot. On the left foot she has a small open area with the mid area not completely viable. She showed me pictures of what looks like a hemorrhagic blister from several days ago but that seems to have healed over this was on the lateral left foot 4/21; patient comes in to clinic with both her wounds on her feet closed. However over the weekend she started having pain in her right foot and leg up into the thigh. She  felt as though she was running a low-grade fever but did not take her temperature. She took a doxycycline that she had leftover and yesterday a single Septra and metronidazole. She thinks things feel somewhat better. 4/28; duplex ultrasound I ordered last week was negative for DVT or superficial thrombophlebitis. She is completed the doxycycline I gave her. States she is still having a lot of pain in the right calf and right ankle which  is no better than last week. She cannot sleep. She also states she has a temperature of up to 101, coughing and complaining of visual loss in her bilateral eyes. Apparently she was tested for Covid 2 weeks ago at [facility] and that was negative. Readmission: 09/03/18 patient presents back for reevaluation after having been evaluated at the end of April regarding erythema and swelling of her right lower extremity. Subsequently she ended up going to the hospital on 07/29/18 and was admitted not to be discharged until 08/08/18. Unfortunately it was noted during the time that she was in the hospital that she did have methicillin-resistant Staphylococcus aureus as the infection noted at the site. It was also determined that she did have osteomyelitis which appears to be fairly significant. She was treated with vancomycin and in fact is still on IV vancomycin at dialysis currently. This is actually slated to continue until 09/12/18 at least which will be the completion of the six weeks of therapy. Nonetheless based on what I'm seeing at this point I'm not sure she will be anywhere near ready to discontinue antibiotics at that time. Since she was released from the hospital she was seen by [name] who is her podiatrist on 08/27/18. His note specifically states that he is recommended that the patient needs of one knee amputation on the right as she has a life- threatening situation that can lead quickly to sepsis. The patient advised she would like to try to save her leg to which Dr.  [name] apparently told her that this was against all medical advice. She also want to discontinue the Wound VAC which had been initiated due to the fact that she wasn't pleased with how the wound was looking and subsequently she wanted to pursue applying Medihoney at that time. He stated that he did not believe that the right lower extremity was salvageable and that the patient understood but would still like to attempt hyperbaric option therapy if it could be of any benefit. She was therefore referred back to [place] for further evaluation. He plans to see her back next week. Upon inspection today patient has a significant amount purulent drainage noted from the wound at this point. The bone in the distal portion of her foot also appears to be extremely necrotic and spongy. When I push down on the bone it bubbles and seeps purulent drainage from deeper in the end of the foot. I do not think that this is likely going to heal very well at all and less aggressive surgical debridement were undertaken more than what I believe we can likely do here in our office. 09/12/2018; I have not seen this patient since the most recent hospitalization although she was in our clinic last week. I have reviewed some of her records from a complex hospitalization. 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 There is situation was complicated by MRSA bacteremia and acute on chronic renal failure now on dialysis. She is receiving vancomycin at dialysis. We started her on Dakin's wet-to-dry last week she is changing this daily. There is still purulent drainage coming out of her foot. Although she is apparently "agreeable" to a below-knee amputation which is been suggested by multiple clinicians she wants this to be done in [place]. She apparently has a telehealth visit with that provider sometime in late [place] 6/24. I have told her I think this is probably too long. Nevertheless I  could not convince her to allow  a local doctor to perform BKA. 09/19/2018; the patient has a large necrotic area on the right anterior foot. She has had previous transmetatarsal amputations. Culture I did last week showed MRSA nothing else she is on vancomycin at dialysis. She has continued leaking purulent drainage out of the distal part of the large circular wound on the right anterior foot. She apparently went to see Dr. 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 Somehow that translated into her being referred to plastic surgery for debridement of the area. I gather she basically refused amputation although I do not have a copy of Dr. Berenice Primas notes. The patient really wants to have a trial of hyperbaric oxygen. I agreed with initial assessment in this clinic that this was probably too far along to benefit however if she is going to have plastic surgery I think she would benefit from ancillary hyperbaric oxygen. The issue here is that the patient has benefited as maximally as any patient I have ever seen from hyperbaric oxygen therapy. Most recently she had exposed bone on the lateral part of her left foot after a surgical procedure and that actually has closed. She has eschared areas in both heels but no open area. She is remained systemically well. I am not optimistic that anything can be done about this but the patient is very clear that she wants an attempt. The attempt would include a wound VAC further debridements and hyperbaric oxygen along with IV antibiotics. 6/26; I put her in for a trial of hyperbaric oxygen only because of the dramatic response she has had with wounds on her left midfoot earlier this year which was a surgical wound that went straight to her bone over the metatarsal heads and also remotely the left third toe. We will see if we can get this through our review process and insurance. She arrives in clinic with again purulent material pouring out  of necrotic bone on the top of the foot distally. There is also some concerning erythema on the front of the leg that we marked. It is bit difficult to tell how tender this is because of neuropathy. I note from infectious disease that she had her vancomycin extended. 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 She had the wound VAC on for part of the week. The rest of the time she is putting various things on this including Medihoney, "ionized water" silver sorb gel etc. 7/7; follow-up along with HBO. She is still on vancomycin at dialysis. She has a large open area on the dorsal right foot and a small dark eschar area on her heel. There is a lot less erythema in the area and a lot less tenderness. From an infection point of view I think this is better. She still has a lot of necrosis in the remaining right forefoot [previous TMA] we are still using the wound VAC in this area 7/16; follow-up along with HBO. I put her on linezolid after she finished her vancomycin. We started this last Friday I gave her 2 weeks worth. I had the expectation that she would be operatively debrided by Dr. Marla Roe but that still has not happened yet. Patient phoned the office this week. She arrives for review today after HBO. The distal part of this wound is completely necrotic. Nonviable pieces of tendon bone was still purulent drainage. Also concerning that she has black eschar over the heel that is expanding. I think this may be indicative of  infection in this area as well. She has less erythema and warmth in the ankle and calf but still an abnormal exam 7/21 follow-up along with HBO. I will renew her linezolid after checking a CBC with differential monitoring her blood counts especially her platelets. She was supposed to have surgery yesterday but if I am reading things correctly this was canceled after her blood sugar was found to be over 500. I thought Dr. Marla Roe who called me said  that they were sending her to the ER but the patient states that was not the case. 7/28. Follow-up along with HBO. She is on linezolid I still do not have any lab work from dialysis even though I called last week. The patient is concerned about an area on her left lateral foot about the level of the base of her fifth metatarsal. I did not really see anything that ominous here however this patient is in [place] ability to point out problems that she is sensing and she has been accurate in the past Finally she received a call from Dr. Marla Roe who is referring her to another orthopedic surgeon stating that she is too booked up to take her to the operating room now. Was still using a wound VAC on the foot 8/3 -Follow-up after HBO, she is got another week of linezolid, she is to call ID for an appointment, x-rays of both feet were reviewed, the left foot x-ray with third MTP joint osteo- Right foot x-ray widespread osteo-in the right midfoot Right ankle x-ray does not show any active evidence of infection 8/11-Patient is seen after HBO, the wounds on the right foot appear to be about the same, the heel wound had some necrotic base over tendon that was debrided with a curette 8/21; patient is seen after HBO. The patient's wound on her dorsal foot actually looks reasonably good and there is substantial amount of epithelialization however the open area distally still has a lot of necrotic debris partially bone. I cannot really get a good sense of just how deep this probes under the foot. She has been pressuring me this week to order medical maggots through a company in [place] for her. The problem I have is there is not a defined wound area here. On the positive side there is no purulence. She has been to see infectious disease she is still on Septra DS although I have not had a chance to review their notes 8/28; patient is seen in conjunction with HBO. The wounds on her foot continued to improve  including the right dorsal foot substantially the, the distal part of this wound and the area on the right heel. We have been using a wound VAC over this chronically. She is still on trimethoprim as directed by infectious disease 9/4; patient is seen in conjunction with HBO. Right dorsal foot wound substantially anteriorly is better however she continues to have a deep wound in the distal part of this that is not responding. We have been using silver collagen under border foam ooArea on the right plantar medial heel seems better. We have been using Hydrofera Blue 12/12/18 on evaluation today patient appears to be doing about the same with regard to her wound based on prior measurements. She does have some necrotic tissue noted on the lateral aspect of the wound that is going require a little bit of sharp debridement today. This includes what appears to be potentially either severely necrotic bone or tendon. Nonetheless other than that she does not  appear to have any severe infection which is good news 9/18; it is been 2 weeks since I saw this wound. She is tolerating HBO well. Continued dramatic improvement in the area on the right dorsal foot. She still has a small wound on the heel that we have been using Hydrofera Blue. She continues with a wound VAC 9/24; patient has to be seen emergently today with a swelling on her right lateral lower leg. She says that she told Dr. Evette Doffing about this and also myself on a couple of occasions but I really have no recollection of this. She is not systemically unwell and her wound really looked good the last time I saw this. She showed this to providers at dialysis and she was able to verify that she was started on cephalexin today for 5 doses at dialysis. She dialyzes on Tuesday Thursday and Saturday. 10/2; patient is seen in conjunction with HBO. The area that is draining on the right anterior medial tibia is more extensive. Copious amounts of serosanguineous  drainage with some purulence. We are still using the wound VAC on the original wound then it is stable. Culture I did of the original IandD showed MRSA I contacted dialysis she is now on vancomycin with dialysis treatments. I asked them to run a month 10/9; patient seen in conjunction with HBO. She had a new spontaneous open area just above the wound on the right medial tibia ankle. More swelling on the right medial tibia. Her wound on the foot looks about the same perhaps slightly better. There is no warmth spreading up her leg but no obvious erythema. her MRI of the foot and ankle and distal tib-fib is not booked for next Friday I discussed this with her in great detail over multiple days. it is likely she has spreading infection upper leg at least involving the distal 25% above the ankle. She knows that if I refer her to orthopedics for infectious disease they are going to recommend amputation and indeed I am not against this myself. We had a good trial at trying to heal the foot which is what she wanted along with antibiotics debridement and HBO however she clearly has spreading infection [probably staph aureus/MRSA]. Nevertheless she once again tells me she wants to wait the left of the MRI. She still makes comments about having her amputation done in [place]. 10/19; arrives today with significant swelling on the lateral right leg. Last culture I did showed Klebsiella. Multidrug-resistant. Cipro was intermediate sensitivity and that is what I have her on pending her MRI which apparently is going to be done on Thursday this week although this seems to be moving back and forth. She is not systemically unwell. We are using silver alginate on her major wound area on the right medial foot and the draining areas on the right lateral lower leg 10/26; MRI showed extensive abscess in the anterior compartment of the right leg also widespread osteomyelitis involving osseous structures of the  midfoot and portions of the hindfoot. Also suspicion for osteomyelitis anterior aspect of the distal medial malleolus. Culture I did of the purulence once again showed a multidrug-resistant Klebsiella. I have been in contact with nephrology late last week and she has been started on cefepime at dialysis to replace the vancomycin We sent a copy of her MRI report to Dr. Geroge Baseman in [place] who is an orthopedic surgeon. The patient takes great stock in his opinion on this. She says she will go to [place] to have  her leg amputated if Dr. Geroge Baseman does not feel there is any salvage options. 11/2; she still is not talk to her orthopedic surgeon in [place]. Apparently he will call her at [phone] this afternoon. The quality of this is she has not allowed me to refer her anywhere. She has been told over and over that she needs this amputated but has not agreed to be referred. She tells me her blood sugar was 600 last night but she has not been febrile. 11/9; she never did got a call from the orthopedic surgeon in [place] therefore that is off the radar. We have arranged to get her see orthopedic surgery at [facility]. She still has a lot of draining purulence coming out of the new abscess in her right leg although that probably came from the osteomyelitis in her right foot and heel. Meanwhile the original wound on the right foot looks very healthy. Continued improvement. The issue is that the last MRI showed osteomyelitis in her right foot extensively she now has an abscess in the right anterior lower leg. There is nobody in [place] who will offer this woman anything but an amputation and to be honest that is probably what she needs. I think she still wants to talk about limb salvage although at this point I just do not see that. She has completed her vancomycin at dialysis which was for the original staph aureus she is still on cefepime for the more recent Klebsiella. She has had a long course of both  of these antibiotics which should have benefited the osteomyelitis on the right foot as well as the abscess. 11/16; apparently [place] elective surgery is shut down because of COVID-19 pandemic. I have reached out to some contacts at [facility] to see if we can get her an orthopedic appointment there. I am concerned about continually leaving this but for the moment everything is static. In fact her original large wound on this foot is closing down. It is the abscess on the right anterior leg that continues to drain purulent serosanguineous material. She is not currently on any antibiotics however she had a prolonged course of vancomycin [1 month] as well as cefepime for a month 02/24/2019 on evaluation today patient appears to be doing better than the last time I saw her. This is not a patient that I typically see. With that being said I am covering for Dr. Dellia Nims this week and again compared to when I last saw her overall the wounds in particular seem to be doing significantly better which is good news. With that being said the patient tells me several disconcerting things. She has not been able to get in to see anyone for potential debridement in regard to her leg wounds although she tells me that she does not think it is necessary any longer because she is taking care of that herself. She noticed a string coming out of the lower wound on her leg over the last week. The patient states that she subsequently decided that we must of pack something in there and started pulling the string out and as it kept coming and coming she realized this was likely her tendon. With that being said she continued to remove as much of this as she could. She then I subsequently proceeded to using tubes of antibiotic ointment which she will stick down into the wound and then scored as much as she can until she sees it coming out of the other wound opening. She states that in  doing this she is actually made things better  and there is less redness and irritation. With regard to her foot wound she does have some necrotic tendon and tissue noted in one small corner but again the actual wound itself seems to be doing better with good granulation in general compared to my last evaluation. 12/7; continued improvement in the wound on the substantial part of the right medial foot. Still a necrotic area inferiorly that required debridement but the rest of this looks very healthy and is contracting. She has 2 wounds on the right lateral leg which were her original drainage sites from her abscess but all of this looks a lot better as well. She has been using silver alginate after putting antibiotic biotic ointment in one wound and watching it come out the other. I have talked to her in some detail today. I had given her names of orthopedic surgeons at [facility] for second opinion on what to do about the right leg. I do not think the patient never called them. She has not been able to get a hold of the orthopedic surgeon in [place] that she had put a lot of faith in as being somebody would give her an opinion that she would trust. I talked to her today and said even if I could get her in to another orthopedic surgeon about the leg which she accept an amputation and she said she would not therefore I am not going to press this issue for the moment 12/14; continued improvement in his substantial wound on the right medial foot. There is still a necrotic area inferiorly with tightly adherent necrotic debris which I have been working on debriding each time she is here. She does not have an orthopedic appointment. Since last time she was here I looked over her cultures which were essentially MRSA on the foot wound and gram-negative rods in the abscess on the anterior leg. 12/21; continued improvement in the area on the right medial foot. She is not up on this much and that is probably a good thing since I do not know it  could support continuous ambulation. She has a small area on the right lateral leg which were remanence of the IandD's I did because of the abscess. I think she should probably have prophylactic antibiotics I am going to have to look this over to see if we can make an intelligent decision here. In the meantime her major wound is come down nicely. Necrotic area inferiorly is still there but looks a lot better 04/06/2019; she has had some improvement in the overall surface area on the right medial foot somewhat narrowedr both but somewhat longer. The areas on the right lateral leg which were initial IandD sites are superficial. Nothing is present on the right heel. We are using silver alginate to the wound areas 1/18; right medial foot somewhat smaller. Still a deep probing area in the most distal recess of the wound. She has nothing open on the right leg. She has a new wound on the plantar aspect of her left fourth toe which may have come from just pulling skin. The patient using Medihoney on the wound on her foot under silver alginate. I cannot discourage her from this 2/1; 2-week follow-up using silver alginate on the right foot and her left fourth toe. The area on the right dorsal foot is contracted although there is still the deep area in the most distal part of the wound but still has some probing  depth. No overt infection 2/15; 2-week follow-up. She continues to have improvement in the surface area on the dorsal right foot. Even the tunneling area from last time is almost closed. The area that was on the plantar part of her left fourth toe over the PIP is indeed closed 3/1; 2-week follow-up. Continued improvement in surface area. The original divot that we have been debriding inferiorly I think has full epithelialization although the epithelialization is gone down into the wound with probably 4 mm of depth. Even under intense illumination I am unable to see anything open here. The remanence of the  wound in this area actually look quite healthy. We have been using silver alginate 3/15; 2-week follow-up. Unfortunately not as good today. She has a comma shaped wound on the dorsal foot however the upper part of this is larger. Under illumination debris on the surface She also tells [place] that she was on her right leg 2 times in the last couple of weeks mostly to reach up for things above her head etc. She felt a sharp pain in the right leg which she thinks is somewhere from the ankle to the knee. The patient has neuropathy and is really uncertain. She cannot feel her foot so she does not think it was coming from there 3/29; 2-week follow-up. Her wound measures smaller. Surface of the wound appears reasonable. She is using silver alginate with underlying Medihoney. She has home health. X-rays I did of her tib-fib last time were negative although it did show arterial calcification 4/12; 2-week follow-up. Her wound measures smaller in length. Using manuka honey with silver alginate on top. She has home health. 4/26; 2-week follow-up. Her wound is smaller but still very adherent debris under illumination requiring debridement she has been using manuka honey with silver alginate. 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 Been using Hydrofera Blue. She is on suppressive Bactrim but apparently she has been taking it twice daily 6/7; I have not seen this wound and about 6 weeks. Since then she was up in [place]. By her own admission she was walking on the foot because she did not have a wheelchair. The wound is not nearly as healthy looking as it was the last time I saw this. We ordered different things for her but she only uses Medihoney and silver alginate. As far as I know she is on suppressive trimethoprim sulfamethoxazole. She does not admit to any fever or chills. Her CBGs apparently are at  baseline however she is saying that she feels some discomfort on the lateral part of her ankle I looked over her last inflammatory markers from the summer 2020 at which time she had a deeply necrotic infected wound in this area. On 11/10/2018 her sedimentation rate was 56 and C-reactive protein 9.9. This was 107 and 29 on 07/29/2018. 6/17; the patient had a necrotic wound the last time she was here on the right dorsal foot. After debridement I did a culture. This showed a very resistant ESBL Klebsiella as well as Enterococcus. Her x-ray of the foot which was done because of warmth and some discomfort showed bone destruction within the carpal bones involving the navicular acute cuboid lateral middle cuneiforms but essentially unchanged from her prior study which was done on 10/29/2018. The findings were felt to represent chronic osteomyelitis. We did inflammatory markers on her. Her white count was  5.25 sedimentation rate 16 and C-reactive protein at 11.1. Notable for the fact that in August 2020 her CRP was 9.9 and sedimentation rate 56. I have looked at her x-rays. It is true that the bone destruction is very impressive however the patient came into this clinic for the wound on her right foot with pieces of bone literally falling out anteriorly with purulent material. I am not exactly sure I could have expected anything different. She has not been systemically unwell no fever chills or blood sugars have been reasonable. 6/28; she arrives with a right heel closed. The substantial area on the right anterior foot looks healthy. Much better looking surface. I think we can change to [facility] seems to help this previously. She is getting her antibiotics at dialysis she should be just about finished 7/9; changed to [facility] last week. Surface wound looks satisfactory not much change in surface area however. She is going to [place] state next week this is usually a difficult thing for this patient  follow-up will be for 2 weeks. 7/23; using Hydrofera Blue. She returns from her trip and the wound looks surprisingly good. Usually when this patient goes on trips she comes back with a lot of problems with the wound. She is saying that she sometimes feels an episodic "crunching" feeling on the lateral part of the foot. She is neuropathic and not feeling pain but wonders whether this could be a neuropathic dysesthesia. 11/13/19-Patient returns after 3 weeks, the wound itself is stable and patient states that there is nothing new going on she is on some extra anxiety medications and is resisting the temptation to pick at the dry skin around the wound. 9/20; patient has not been here in over a month and I have not seen her in 2 months. The wound in terms of size I think is about the same. There is no exposed bone. She has a nonviable surface on this. She is supposed to be using [facility] however she is also been using some form of honey preparation as well as a silver-based dressings. I do not think she has any pattern to this. 10/4; 2-week follow-up. Patient has been using some form of spray which she says has honey and silver to purchase this online she has been covering it with gauze. In spite of this the wound actually looks quite good. The deeper divot distally appears to be close down. There is a rim of epithelialization. 10/18; 2-week follow-up. Patient has been using her Hydrofera Blue covered with her silver honey spray that she got online. 11/1; 2-week follow-up. She is using Hydrofera Blue with a silver honey spray. Wound bed is measuring smaller. She has noticed that her foot is warmer on the right. She is concerned about infection. For a long period of time I had her on prophylactic trimethoprim sulfamethoxazole DS 1 tablet daily. She is asking for this to be restarted. The patient is walking on this foot because of repairs that are being done in a home her but her room is on the second  floor she has to go up and down stairs. I have cautioned against this however as usual she will do exactly what she wants to do 11/15; 2-week follow-up. She uses Hydrofera Blue with a silver/honey spray which I have never heard of. I think her wound looks about the same. Some epithelialization. No evidence that this is infected. I think she is walking on this more than we agreed on. She is  going on extensive vacation over Thanksgiving 12/3; 2-week follow-up. She is using Hydrofera Blue however over Thanksgiving she ran out of this and she is simply been using Medihoney. In spite of this her wound is smaller almost divided into 2 now. She traveled extensively over Thanksgiving and actually looks quite good in spite of this. Usually this is been a marker of problems for her 12/17; 2-week follow-up. She is using Hydrofera Blue. The wound is smaller. Debris on the surface of this is fibrinous. She is traveling to [place] over the holidays which never bodes well for her wounds. I think she is walking more on her feet then she is even willing to admit and she tells me she does walk 2/11; using Hydrofera Blue. Her wounds are contracting however she walks in the clinic with a history that she has not been able to eat she has had vomiting. She also has right eye problems. Her blood sugar was 567. She had blood work done at dialysis and we called there to get her blood work although it still had not returned although they should have this by the end of the day we were told. She also stated that she was not sure what her blood sugar was as her glucose monitor was not working and not coming to tomorrow. She also for some reason does not think her insulin pump is working well. Her endocrinologist is Dr. Elayne Snare 06/03/2020 upon evaluation today patient actually appears to be doing excellent in regard to her wound. In fact she tells me it was not even bleeding until she picked a dry piece of skin off while we were  getting ready to come in and see her today. Fortunately there is no signs of active infection which is great news and overall very pleased. She is going to be seeing her neurologist sometime shortly I believe it might even be on Monday. Nonetheless there can proceed with the work-up as far as anything else going on with her eye the fact that her right eyelid is drooping. 3/25; right medial dorsal foot. She has superficial areas here that appear to be fully epithelialized she is using Medihoney and silver alginate and some combination. ooShe has a new area on the mid left foot at roughly the level of the fifth metatarsal base 4/84/8; right medial dorsal foot. Still superficial areas here. I cleaned up 1 of these today she has been using I think mostly Medihoney but I would like her to use silver alginate. The area on the mid part of her foot also looks improved on the left. Since she was last here she tells me that she noted pus in the area of her left foot. She told dialysis about this they did a culture and ultimately she is on IV antibiotics but were not sure which one. They referred her to [name] he said that he did not need to follow her. I have not really verified any of this and I do not know what antibiotic she is on at dialysis 4/29; patient has been using Medihoney to her wounds. She reports taking off the toenail to her left second toe. She states that home health discharged her and she has not had help with dressing changes. She denies any signs and symptoms of infection. Patient History Information obtained from Patient. Family History Cancer - Father, Diabetes - Maternal Grandparents, Stroke - Paternal Grandparents, No family history of Heart Disease, Hereditary Spherocytosis, Hypertension, Kidney Disease, Lung Disease, Seizures, Thyroid  Problems, Tuberculosis. Social History Former smoker, Marital Status - Single, Alcohol Use - Moderate, Drug Use - Current History - TCH, Caffeine  Use - Moderate - tea. Medical History Eyes Patient has history of Cataracts - mild Denies history of Glaucoma, Optic Neuritis Ear/Nose/Mouth/Throat Patient has history of Chronic sinus problems/congestion - "my nose runs all the time" Denies history of Middle ear problems Hematologic/Lymphatic Patient has history of Anemia Denies history of Hemophilia, Human Immunodeficiency Virus, Lymphedema, Sickle Cell Disease Respiratory Patient has history of Sleep Apnea - doesn't tolorate cpap Denies history of Aspiration, Asthma, Chronic Obstructive Pulmonary Disease (COPD), Pneumothorax, Tuberculosis Cardiovascular Patient has history of Deep Vein Thrombosis - bil arms and leg leg, Hypertension, Peripheral Arterial Disease Denies history of Angina, Arrhythmia, Congestive Heart Failure, Coronary Artery Disease, Hypotension, Myocardial Infarction, Peripheral Venous Disease, Phlebitis, Vasculitis Gastrointestinal Denies history of Cirrhosis , Colitis, Crohnoos, Hepatitis A, Hepatitis B, Hepatitis C Endocrine Patient has history of Type I Diabetes - 1975 Denies history of Type II Diabetes Genitourinary Denies history of End Stage Renal Disease Immunological Denies history of Lupus Erythematosus, Raynaudoos, Scleroderma Integumentary (Skin) Denies history of History of Burn Musculoskeletal Patient has history of 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 - c-section. - exostectomy left cuboid and 5th metatarsal. - laser eye  surgery. - left fem-pop bypass graft. - hemrroidectomy. - left eye cataract removed. - kidney stone. Medical A Surgical History Notes nd Cardiovascular hyperlipidemia Genitourinary CKD Objective Constitutional respirations regular, non-labored and within target range for patient.. Vitals Time Taken: 12:57 PM, Height: 67 in, Weight: 125 lbs, BMI: 19.6, Temperature: 98.7 F, Pulse: 83 bpm, Respiratory Rate: 16 breaths/min, Blood Pressure: 180/95 mmHg, Capillary Blood Glucose: 150 mg/dl. General Notes: glucose per pt report Cardiovascular 2+ dorsalis pedis/posterior tibialis pulses. Psychiatric pleasant and cooperative. General Notes: Left plantar foot wound: There is a wound with circumferential callus. No signs of infection. Granulation tissue was present when callus removed. Right dorsal foot wound: This is limited to skin breakdown. No signs of infection. Left second toe: Erythematous and appears to be a pressure injury. Epidermis layer disrupted. Integumentary (Hair, Skin) Wound #43 status is Open. Original cause of wound was Gradually Appeared. The date acquired was: 08/04/2018. The wound has been in treatment 99 weeks. The wound is located on the Right,Medial Foot. The wound measures 0.6cm length x 0.8cm width x 0.1cm depth; 0.377cm^2 area and 0.038cm^3 volume. There is Fat Layer (Subcutaneous Tissue) exposed. There is no tunneling or undermining noted. There is a medium amount of serosanguineous drainage noted. The wound margin is flat and intact. There is large (67-100%) pink granulation within the wound bed. There is no necrotic tissue within the wound bed. Wound #50 status is Open. Original cause of wound was Skin T ear/Laceration. The date acquired was: 06/24/2020. The wound has been in treatment 5 weeks. The wound is located on the [place]. The wound measures 0.3cm length x 0.2cm width x 0.3cm depth; 0.047cm^2 area and 0.014cm^3 volume. There is Fat Layer (Subcutaneous  Tissue) exposed. There is no tunneling noted, however, there is undermining starting at 12:00 and ending at 12:00 with a maximum distance of 0.4cm.  There is a medium amount of serosanguineous drainage noted. The wound margin is well defined and not attached to the wound base. There is large (67-100%) red granulation within the wound bed. There is no necrotic tissue within the wound bed. Wound #51 status is Open. Original cause of wound was Trauma. The date acquired was: 07/29/2020. The wound is located on the Left T Second. The wound oe measures 1.5cm length x 1cm width x 0.3cm depth; 1.178cm^2 area and 0.353cm^3 volume. There is Fat Layer (Subcutaneous Tissue) exposed. There is no tunneling or undermining noted. There is a medium amount of serosanguineous drainage noted. The wound margin is indistinct and nonvisible. There is large (67- 100%) pink granulation within the wound bed. There is a small (1-33%) amount of necrotic tissue within the wound bed including Adherent Slough. 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 It is almost epithelialized. She can continue Medihoney to this. She has significant callus to the left plantar foot and this was debrided. Underneath there is healthy granulation tissue present I recommended using silver alginate to this area. She has developed a new wound to her left second toe. This appears to be pressure related. She has no open toed shoes and we will offer her a surgical shoe today. Part of the skin was disrupted and this was removed with scissors and pickups today. Overall there are no signs of infection and I think she would do fine  with silver collagen. I would like for her to have follow-up in a week however she is leaving for a beach trip. I asked her to follow-up after her trip. Procedures Wound #50 Pre-procedure diagnosis of Wound #50 is a Diabetic Wound/Ulcer of the Lower Extremity located on the [place] .Severity of Tissue Pre Debridement is: Fat layer exposed. There was a Excisional Skin/Subcutaneous Tissue Debridement with a total area of 0.06 sq cm performed by Kalman Shan, DO. With the following instrument(s): Curette to remove Non-Viable tissue/material. Material removed includes Callus and Subcutaneous Tissue and after achieving pain control using Other (Benzocaine). No specimens were taken. A time out was conducted at 13:35, prior to the start of the procedure. A Minimum amount of bleeding was controlled with Pressure. The procedure was tolerated well. Post Debridement Measurements: 0.5cm length x 0.5cm width x 0.3cm depth; 0.059cm^3 volume. Character of Wound/Ulcer Post Debridement is stable. Severity of Tissue Post Debridement is: Fat layer exposed. Post procedure Diagnosis Wound #50: Same as Pre-Procedure Wound #51 Pre-procedure diagnosis of Wound #51 is a Diabetic Wound/Ulcer of the Lower Extremity located on the Left T Second .Severity of Tissue Pre Debridement oe is: Fat layer exposed. There was a Selective/Open Wound Skin/Dermis Debridement with a total area of 1.5 sq cm performed by Kalman Shan, DO. With the following instrument(s): Forceps, and Scissors to remove Non-Viable tissue/material. Material removed includes Skin: Dermis after achieving pain control using Other (Benzocaine). No specimens were taken. A time out was conducted at 13:35, prior to the start of the procedure. A Minimum amount of bleeding was controlled with Pressure. The procedure was tolerated well. Post Debridement Measurements: 1.5cm length x 1cm width x 0.3cm depth; 0.353cm^3 volume. Character of Wound/Ulcer  Post Debridement is stable. Severity  of Tissue Post Debridement is: Fat layer exposed. Post procedure Diagnosis Wound #51: Same as Pre-Procedure Plan Follow-up Appointments: Return Appointment in 2 weeks. - with Dr. Dellia Nims Bathing/ Shower/ Hygiene: May shower and wash wound with soap and water. Edema Control - Lymphedema / SCD / Other: Elevate legs to the level of the heart or above for 30 minutes daily and/or when sitting, a frequency of: - throughout the day Avoid standing for long periods of time. Moisturize legs daily. - with dressing changes Off-Loading: Open toe surgical shoe to: - to both feet Other: - minimal weight bearing right foot Home Health: New wound care orders this week; continue Home Health for wound care. May utilize formulary equivalent dressing for wound treatment orders unless otherwise specified. - New wound to left 2nd toe. Other Home Health Orders/Instructions: - Interim 3x week WOUND #43: - Foot Wound Laterality: Right, Medial Cleanser: Wound Cleanser (Home Health) Every Other Day/30 Days Discharge Instructions: Cleanse the wound with wound cleanser or normal saline prior to applying a clean dressing using gauze sponges, not tissue or cotton balls. Prim Dressing: MediHoney Gel, tube 1.5 (oz) (Home Health) Every Other Day/30 Days ary Discharge Instructions: Apply to wound bed as instructed Secondary Dressing: Woven Gauze Sponge, Non-Sterile 4x4 in [facility]) Every Other Day/30 Days Discharge Instructions: Apply over primary dressing as directed. Secured With: Elastic Bandage 4 inch (ACE bandage) (Home Health) Every Other Day/30 Days Discharge Instructions: Secure with ACE bandage as directed. Secured With: [employer], 4.5x3.1 (in/yd) [facility]) Every Other Day/30 Days Discharge Instructions: Secure with Kerlix as directed. Secured With: Paper T ape, 2x10 (in/yd) (Home Health) Every Other Day/30 Days Discharge Instructions: Secure dressing with  tape as directed. WOUND #50: - Foot Wound Laterality: Plantar, Left Cleanser: Wound Cleanser (Home Health) Every Other Day/30 Days Discharge Instructions: Cleanse the wound with wound cleanser or normal saline prior to applying a clean dressing using gauze sponges, not tissue or cotton balls. Prim Dressing: KerraCel Ag Gelling Fiber Dressing, 2x2 in (silver alginate) (Home Health) Every Other Day/30 Days ary Discharge Instructions: Apply silver alginate to wound bed in clinic. pt may use silver honey spray at home Secondary Dressing: Woven Gauze Sponge, Non-Sterile 4x4 in [facility]) Every Other Day/30 Days Discharge Instructions: Apply over primary dressing as directed. Secured With: Elastic Bandage 4 inch (ACE bandage) (Home Health) Every Other Day/30 Days Discharge Instructions: Secure with ACE bandage as directed. Secured With: [employer], 4.5x3.1 (in/yd) [facility]) Every Other Day/30 Days Discharge Instructions: Secure with Kerlix as directed. Secured With: Paper T ape, 2x10 (in/yd) (Home Health) Every Other Day/30 Days Discharge Instructions: Secure dressing with tape as directed. WOUND #51: - T Second Wound Laterality: Left oe Cleanser: Soap and Water [facility]) Every Other Day/15 Days Discharge Instructions: May shower and wash wound with dial antibacterial soap and water prior to dressing change. Prim Dressing: Promogran Prisma Matrix, 4.34 (sq in) (silver collagen) (Home Health) Every Other Day/15 Days ary Discharge Instructions: Moisten collagen with saline or hydrogel Secondary Dressing: Woven Gauze Sponges 2x2 in [facility]) Every Other Day/15 Days Discharge Instructions: Apply over primary dressing as directed. Secured With: Child psychotherapist, Sterile 2x75 (in/in) (Home Health) Every Other Day/15 Days Discharge Instructions: Secure with stretch gauze as directed. Secured With: Paper T ape, 1x10 (in/yd) (Home Health) Every Other Day/15  Days Discharge Instructions: Secure dressing with tape as directed. 1. Medihoney to the right Wound, silver alginate to the plantar foot wound and collagen to  the toe wound 2. Follow-up in 2 weeks. 3. Offload the left second toe Electronic Signature(s) Signed: 07/29/2020 2:04:46 PM By: Kalman Shan DO Entered By: Kalman Shan on 07/29/2020 14:03:33 -------------------------------------------------------------------------------- HxROS Details Patient Name: Date of Service: [name] 07/29/2020 12:30 PM Medical Record Number: [id number] Patient Account Number: [id number] Date of Birth/Sex: Treating RN: 11-11-1970 (50 y.o. [name] Primary Care Provider: [name], [name] Other Clinician: Referring Provider: Treating Provider/Extender: Kalman Shan MO REIRA, [name] Weeks in Treatment: 11 Information Obtained From Patient Eyes Medical History: Positive for: Cataracts - mild Negative for: Glaucoma; Optic Neuritis Ear/Nose/Mouth/Throat Medical History: Positive for: Chronic sinus problems/congestion - "my nose runs all the time" Negative for: Middle ear problems Hematologic/Lymphatic Medical History: Positive for: Anemia Negative for: Hemophilia; Human Immunodeficiency Virus; Lymphedema; Sickle Cell Disease Respiratory Medical History: Positive for: Sleep Apnea - doesn't tolorate cpap Negative for: Aspiration; Asthma; Chronic Obstructive Pulmonary Disease (COPD); Pneumothorax; Tuberculosis Cardiovascular Medical History: Positive for: Deep Vein Thrombosis - bil arms and leg leg; Hypertension; Peripheral Arterial Disease Negative for: Angina; Arrhythmia; Congestive Heart Failure; Coronary Artery Disease; Hypotension; Myocardial Infarction; Peripheral Venous Disease; Phlebitis; Vasculitis Past Medical History Notes: hyperlipidemia Gastrointestinal Medical History: Negative for: Cirrhosis ; Colitis; Crohns; Hepatitis A; Hepatitis B; Hepatitis  C Endocrine Medical History: Positive for: Type I Diabetes - 1975 Negative for: Type II Diabetes Time with diabetes: 47 years Treated with: Insulin Blood sugar tested every day: Yes Tested : 4-7 times per day Genitourinary Medical History: Negative for: End Stage Renal Disease Past Medical History Notes: CKD Immunological Medical History: Negative for: Lupus Erythematosus; Raynauds; Scleroderma Integumentary (Skin) Medical History: Negative for: History of Burn Musculoskeletal Medical History: Positive for: Osteoarthritis - in feet; Osteomyelitis - left foot and right 1st met head Negative for: Gout; Rheumatoid Arthritis Neurologic Medical History: Positive for: Neuropathy - feet; Seizure Disorder - diabetic related Negative for: Dementia; Quadriplegia; Paraplegia Oncologic Medical History: Negative for: Received Chemotherapy; Received Radiation Psychiatric Medical History: Negative for: Anorexia/bulimia; Confinement Anxiety HBO Extended History Items Ear/Nose/Mouth/Throat: Eyes: Chronic sinus Cataracts problems/congestion Immunizations Pneumococcal Vaccine: Received Pneumococcal Vaccination: Yes Implantable Devices Yes Hospitalization / Surgery History Type of Hospitalization/Surgery 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 c-section exostectomy left cuboid and 5th metatarsal laser eye surgery left fem-pop bypass graft hemrroidectomy left eye cataract removed kidney stone Family and Social History Cancer: Yes - Father; Diabetes: Yes - Maternal Grandparents; Heart Disease: No; Hereditary Spherocytosis: No; Hypertension: No; Kidney Disease: No; Lung Disease: No; Seizures: No; Stroke: Yes - Paternal Grandparents; Thyroid Problems: No; Tuberculosis: No; Former smoker; Marital Status - Single; Alcohol Use: Moderate; Drug Use: Current History - TCH; Caffeine Use: Moderate - tea; Financial Concerns: No; Food, Clothing or Shelter  Needs: No; Support System Lacking: No; Transportation Concerns: No Electronic Signature(s) Signed: 07/29/2020 2:04:46 PM By: Kalman Shan DO Signed: 07/29/2020 5:13:52 PM By: Lorrin Jackson Entered By: Kalman Shan on 07/29/2020 14:00:07 -------------------------------------------------------------------------------- SuperBill Details Patient Name: Date of Service: [name] 07/29/2020 Medical Record Number: [id number] Patient Account Number: [id number] Date of Birth/Sex: Treating RN: [birth year] (50 y.o. [name] Primary Care Provider: [name], [name] Other Clinician: Referring Provider: Treating Provider/Extender: Kalman Shan MO REIRA, [name] Weeks in Treatment: 99 Diagnosis Coding ICD-10 Codes Code Description L97.514 Non-pressure chronic ulcer of other part of right foot with necrosis of bone E10.621 Type 1 diabetes mellitus with foot ulcer L97.529 Non-pressure chronic ulcer of other part of  left foot with unspecified severity L97.521 Non-pressure chronic ulcer of other part of left foot limited to breakdown of skin Facility Procedures CPT4 Code: 71165790 Description: 38333 - DEB SUBQ TISSUE 20 SQ CM/< ICD-10 Diagnosis Description L97.514 Non-pressure chronic ulcer of other part of right foot with necrosis of bone E10.621 Type 1 diabetes mellitus with foot ulcer Modifier: Quantity: 1 CPT4 Code: 83291916 Description: 60600 - DEBRIDE WOUND 1ST 20 SQ CM OR < ICD-10 Diagnosis Description L97.514 Non-pressure chronic ulcer of other part of right foot with necrosis of bone E10.621 Type 1 diabetes mellitus with foot ulcer Modifier: Quantity: 1 Physician Procedures : CPT4 Code Description Modifier [id number] - WC PHYS SUBQ TISS 20 SQ CM ICD-10 Diagnosis Description L97.514 Non-pressure chronic ulcer of other part of right foot with necrosis of bone E10.621 Type 1 diabetes mellitus with foot ulcer Quantity: 1 : [id number] 20233 - WC PHYS DEBR WO  ANESTH 20 SQ CM ICD-10 Diagnosis Description L97.514 Non-pressure chronic ulcer of other part of right foot with necrosis of bone E10.621 Type 1 diabetes mellitus with foot ulcer Quantity: 1 Electronic Signature(s) Signed: 07/29/2020 2:04:46 PM By: Kalman Shan DO Entered By: Kalman Shan on 07/29/2020 14:04:19

## 2020-08-01 NOTE — Progress Notes (Signed)
JENESYS, CASSEUS ([id number]) Visit Report for 07/29/2020 Arrival Information Details Patient Name: Date of Service: [name] 07/29/2020 12:30 PM Medical Record Number: [id number] Patient Account Number: [id number] Date of Birth/Sex: Treating RN: [birth year] (50 y.o. Nancy Fetter Primary Care Millee Denise: [name], [name] Other Clinician: Referring Victoire Deans: Treating Adriane Gabbert/Extender: Kalman Shan MO REIRA, [name] Weeks in Treatment: 47 Visit Information History Since Last Visit Added or deleted any medications: No Patient Arrived: Other Any new allergies or adverse reactions: No Arrival Time: 12:57 Had a fall or experienced change in No Accompanied By: alone activities of daily living that may affect Transfer Assistance: None risk of falls: Patient Identification Verified: Yes Signs or symptoms of abuse/neglect since last visito No Secondary Verification Process Completed: Yes Hospitalized since last visit: No Patient Requires Transmission-Based Precautions: No Implantable device outside of the clinic excluding No Patient Has Alerts: No cellular tissue based products placed in the center since last visit: Has Dressing in Place as Prescribed: Yes Pain Present Now: No Electronic Signature(s) Signed: 08/01/2020 5:39:21 PM By: [name] RN, BSN Entered By: [name] on 07/29/2020 12:58:07 -------------------------------------------------------------------------------- Encounter Discharge Information Details Patient Name: Date of Service: [place] LLO [address] [name] 07/29/2020 12:30 PM Medical Record Number: [id number] Patient Account Number: [id number] Date of Birth/Sex: Treating RN: Aug 25, 1970 (50 y.o. [name] Primary Care Patrica Mendell: [name], [name] Other Clinician: Referring Cyler Kappes: Treating Dani Wallner/Extender: Kalman Shan MO REIRA, [name] Weeks in Treatment: 53 Encounter Discharge Information Items Post Procedure Vitals Discharge  Condition: Stable Temperature (F): 98.7 Ambulatory Status: Wheelchair Pulse (bpm): 83 Discharge Destination: Home Respiratory Rate (breaths/min): 18 Transportation: Private Auto Blood Pressure (mmHg): 180/85 Accompanied By: self Schedule Follow-up Appointment: Yes Clinical Summary of Care: Electronic Signature(s) Signed: 07/29/2020 5:30:47 PM By: Deon Pilling Entered By: Deon Pilling on 07/29/2020 14:33:18 -------------------------------------------------------------------------------- Lower Extremity Assessment Details Patient Name: Date of Service: [name] 07/29/2020 12:30 PM Medical Record Number: [id number] Patient Account Number: [id number] Date of Birth/Sex: Treating RN: 1970-05-02 (50 y.o. Nancy Fetter Primary Care Kindred Reidinger: [name], [name] Other Clinician: Referring Armarion Greek: Treating Elhadj Girton/Extender: Kalman Shan MO REIRA, [name] Weeks in Treatment: 99 Edema Assessment Assessed: [Left: No] [Right: No] Edema: [Left: No] [Right: No] Calf Left: Right: Point of Measurement: From Medial Instep 27 cm Ankle Left: Right: Point of Measurement: From Medial Instep 19 cm Vascular Assessment Pulses: Dorsalis Pedis Palpable: [Right:Yes] Electronic Signature(s) Signed: 08/01/2020 5:39:21 PM By: [name] RN, BSN Entered By: [name] on 07/29/2020 12:58:58 -------------------------------------------------------------------------------- Multi Wound Chart Details Patient Name: Date of Service: [name] 07/29/2020 12:30 PM Medical Record Number: [id number] Patient Account Number: [id number] Date of Birth/Sex: Treating RN: 06-23-1970 (50 y.o. [name] Primary Care Londan Coplen: [name], [name] Other Clinician: Referring Nathin Saran: Treating Geraline Halberstadt/Extender: Kalman Shan MO REIRA, [name] Weeks in Treatment: 74 Vital Signs Height(in): 57 Capillary Blood Glucose(mg/dl): 150 Weight(lbs): 125 Pulse(bpm):  46 Body Mass Index(BMI): 20 Blood Pressure(mmHg): 180/95 Temperature(F): 98.7 Respiratory Rate(breaths/min): 16 Photos: [43:No Photos Right, Medial Foot] [50:No Photos Left, Plantar Foot] [51:No 66 Left T Second oe] Wound Location: [43:Gradually Appeared] [50:Skin Tear/Laceration] [51:Trauma] Wounding Event: [43:Diabetic Wound/Ulcer of the Lower] [50:Diabetic Wound/Ulcer of the Lower] [51:Diabetic Wound/Ulcer of the Lower] Primary Etiology: [43:Extremity Cataracts, Chronic sinus] [50:Extremity Cataracts, Chronic sinus] [51:Extremity Cataracts, Chronic sinus] Comorbid History: [43:problems/congestion, Anemia, Sleep Apnea, Deep Vein Thrombosis, Hypertension, Peripheral Arterial Disease, Type I Diabetes, Osteoarthritis,  Osteomyelitis, Neuropathy, Seizure Disorder 08/04/2018] [50:problems/congestion, Anemia, Sleep  Apnea, Deep Vein Thrombosis, Hypertension, Peripheral Arterial Disease, Type I Diabetes, Osteoarthritis, Osteomyelitis, Neuropathy, Seizure Disorder 06/24/2020] [51:problems/congestion, Anemia, Sleep Apnea, Deep Vein Thrombosis, Hypertension, Peripheral  Arterial Disease, Type I Diabetes, Osteoarthritis, Osteomyelitis, Neuropathy, Seizure Disorder 07/29/2020] Date Acquired: [43:99] [50:5] [51:0] Weeks of Treatment: [43:Open] [50:Open] [51:Open] Wound Status: [43:Yes] [50:No] [51:No] Clustered Wound: [43:0.6x0.8x0.1] [50:0.3x0.2x0.3] [51:1.5x1x0.3] Measurements L x W x D (cm) [43:0.377] [50:0.047] [51:1.178] A (cm) : rea [43:0.038] [50:0.014] [51:0.353] Volume (cm) : [43:99.50%] [50:76.00%] [51:N/A] % Reduction in A [43:rea: 99.80%] [50:64.10%] [51:N/A] % Reduction in Volume: [50:12] Starting Position 1 (o'clock): [50:12] Ending Position 1 (o'clock): [50:0.4] Maximum Distance 1 (cm): [43:No] [50:Yes] [51:No] Undermining: [43:Grade 4] [50:Grade 1] [51:Grade 2] Classification: [43:Medium] [50:Medium] [51:Medium] Exudate A mount: [43:Serosanguineous] [50:Serosanguineous]  [51:Serosanguineous] Exudate Type: [43:red, brown] [50:red, brown] [51:red, brown] Exudate Color: [43:Flat and Intact] [50:Well defined, not attached] [51:Indistinct, nonvisible] Wound Margin: [43:Large (67-100%)] [50:Large (67-100%)] [51:Large (67-100%)] Granulation A mount: [43:Pink] [50:Red] [51:Pink] Granulation Quality: [43:None Present (0%)] [50:None Present (0%)] [51:Small (1-33%)] Necrotic A mount: [43:Fat Layer (Subcutaneous Tissue): Yes Fat Layer (Subcutaneous Tissue): Yes Fat Layer (Subcutaneous Tissue): Yes] Exposed Structures: [43:Fascia: No Tendon: No Muscle: No Joint: No Bone: No Medium (34-66%)] [50:Fascia: No Tendon: No Muscle: No Joint: No Bone: No Medium (34-66%)] [51:Fascia: No Tendon: No Muscle: No Joint: No Bone: No None] Epithelialization: [43:N/A] [50:Debridement - Excisional] [51:Debridement - Selective/Open Wound] Debridement: Pre-procedure Verification/Time Out N/A [50:13:35] [51:13:35] Taken: [43:N/A] [50:Other] [51:Other] Pain Control: [43:N/A] [50:Callus, Subcutaneous] [51:N/A] Tissue Debrided: [43:N/A] [50:Skin/Subcutaneous Tissue] [51:Skin/Dermis] Level: [43:N/A] [50:0.06] [51:1.5] Debridement A (sq cm): [43:rea N/A] [50:Curette] [51:Forceps, Scissors] Instrument: [43:N/A] [50:Minimum] [51:Minimum] Bleeding: [43:N/A] [50:Pressure] [51:Pressure] Hemostasis A chieved: [43:N/A] [50:Procedure was tolerated well] [51:Procedure was tolerated well] Debridement Treatment Response: [43:N/A] [50:0.5x0.5x0.3] [51:1.5x1x0.3] Post Debridement Measurements L x W x D (cm) [43:N/A] [50:0.059] [51:0.353] Post Debridement Volume: (cm) [43:N/A] [50:Debridement] [51:Debridement] Treatment Notes Electronic Signature(s) Signed: 07/29/2020 2:04:46 PM By: Kalman Shan DO Signed: 07/29/2020 5:13:52 PM By: Lorrin Jackson Entered By: Kalman Shan on 07/29/2020 13:57:53 -------------------------------------------------------------------------------- Multi-Disciplinary Care  Plan Details Patient Name: Date of Service: [place] LLO [address] [name] 07/29/2020 12:30 PM Medical Record Number: [id number] Patient Account Number: [id number] Date of Birth/Sex: Treating RN: [birth year] (50 y.o. [name] Primary Care Shuntel Fishburn: [name], [name] Other Clinician: Referring Imer Foxworth: Treating Sahid Borba/Extender: Kalman Shan MO REIRA, [name] Weeks in Treatment: [place] reviewed with physician Active Inactive Nutrition Nursing Diagnoses: Imbalanced nutrition Impaired glucose control: actual or potential Potential for alteratiion in Nutrition/Potential for imbalanced nutrition Goals: Patient/caregiver agrees to and verbalizes understanding of need to use nutritional supplements and/or vitamins as prescribed Date Initiated: 09/03/2018 Date Inactivated: 10/07/2018 Target Resolution Date: 10/01/2018 Goal Status: Met Patient/caregiver will maintain therapeutic glucose control Date Initiated: 09/03/2018 Target Resolution Date: 08/19/2020 Goal Status: Active Interventions: Provide education on elevated blood sugars and impact on wound healing Treatment Activities: Patient referred to Primary Care Physician for further nutritional evaluation : 09/03/2018 Notes: 07/29/20: Glucose control ongoing, target date extended. Wound/Skin Impairment Nursing Diagnoses: Impaired tissue integrity Knowledge deficit related to ulceration/compromised skin integrity Goals: Patient/caregiver will verbalize understanding of skin care regimen Date Initiated: 09/03/2018 Target Resolution Date: 08/19/2020 Goal Status: Active Ulcer/skin breakdown will have a volume reduction of 30% by week 4 Date Initiated: 09/03/2018 Date Inactivated: 10/07/2018 Target Resolution Date: 10/01/2018 Goal Status: Unmet Unmet Reason: COMORBITIES Ulcer/skin breakdown will have a volume reduction of 50% by week 8 Date Initiated: 10/07/2018 Date  Inactivated: 11/03/2018 Target Resolution Date:  10/31/2018 Goal Status: Unmet Unmet Reason: Osteomyelitis Interventions: Assess patient/caregiver ability to obtain necessary supplies Assess patient/caregiver ability to perform ulcer/skin care regimen upon admission and as needed Assess ulceration(s) every visit Provide education on ulcer and skin care Treatment Activities: Skin care regimen initiated : 09/03/2018 Topical wound management initiated : 09/03/2018 Notes: Electronic Signature(s) Signed: 07/29/2020 1:24:40 PM By: Lorrin Jackson Entered By: Lorrin Jackson on 07/29/2020 13:24:38 -------------------------------------------------------------------------------- Pain Assessment Details Patient Name: Date of Service: [name] 07/29/2020 12:30 PM Medical Record Number: [id number] Patient Account Number: [id number] Date of Birth/Sex: Treating RN: Jun 20, 1970 (50 y.o. Nancy Fetter Primary Care Karter Haire: [name], [name] Other Clinician: Referring Kassius Battiste: Treating Atreus Hasz/Extender: Kalman Shan MO REIRA, [name] Weeks in Treatment: 58 Active Problems Location of Pain Severity and Description of Pain Patient Has Paino No Site Locations Pain Management and Medication Current Pain Management: Electronic Signature(s) Signed: 08/01/2020 5:39:21 PM By: [name] RN, BSN Entered By: [name] on 07/29/2020 12:58:51 -------------------------------------------------------------------------------- Patient/Caregiver Education Details Patient Name: Date of Service: [name] 4/29/2022andnbsp12:30 PM Medical Record Number: [id number] Patient Account Number: [id number] Date of Birth/Gender: Treating RN: [birth year] (50 y.o. [name] Primary Care Physician: [name], [name] Other Clinician: Referring Physician: Treating Physician/Extender: Kalman Shan MO REIRA, [name] Weeks in Treatment: 11 Education Assessment Education Provided To: Patient Education Topics  Provided Elevated Blood Sugar/ Impact on Healing: Methods: Explain/Verbal, Printed Responses: State content correctly Wound/Skin Impairment: Methods: Demonstration, Explain/Verbal, Printed Responses: State content correctly Electronic Signature(s) Signed: 07/29/2020 5:13:52 PM By: Lorrin Jackson Entered By: Lorrin Jackson on 07/29/2020 13:25:19 -------------------------------------------------------------------------------- Wound Assessment Details Patient Name: Date of Service: [name] 07/29/2020 12:30 PM Medical Record Number: [id number] Patient Account Number: [id number] Date of Birth/Sex: Treating RN: 21-Jun-1970 (49 y.o. Nancy Fetter Primary Care Elena Davia: [name], [name] Other Clinician: Referring Oneisha Ammons: Treating Kelise Kuch/Extender: Kalman Shan MO REIRA, [name] Weeks in Treatment: 99 Wound Status Wound Number: 43 Primary Diabetic Wound/Ulcer of the Lower Extremity Etiology: Wound Location: Right, Medial Foot Wound Open Wounding Event: Gradually Appeared Status: Date Acquired: 08/04/2018 Comorbid Cataracts, Chronic sinus problems/congestion, Anemia, Sleep Weeks Of Treatment: 99 History: Apnea, Deep Vein Thrombosis, Hypertension, Peripheral Arterial Clustered Wound: Yes Disease, Type I Diabetes, Osteoarthritis, Osteomyelitis, Neuropathy, Seizure Disorder Photos Wound Measurements Length: (cm) 0.6 Width: (cm) 0.8 Depth: (cm) 0.1 Area: (cm) 0.377 Volume: (cm) 0.038 % Reduction in Area: 99.5% % Reduction in Volume: 99.8% Epithelialization: Medium (34-66%) Tunneling: No Undermining: No Wound Description Classification: Grade 4 Wound Margin: Flat and Intact Exudate Amount: Medium Exudate Type: Serosanguineous Exudate Color: red, brown Foul Odor After Cleansing: No Slough/Fibrino No Wound Bed Granulation Amount: Large (67-100%) Exposed Structure Granulation Quality: Pink Fascia Exposed: No Necrotic Amount: None Present (0%) Fat  Layer (Subcutaneous Tissue) Exposed: Yes Tendon Exposed: No Muscle Exposed: No Joint Exposed: No Bone Exposed: No Treatment Notes Wound #43 (Foot) Wound Laterality: Right, Medial Cleanser Wound Cleanser Discharge Instruction: Cleanse the wound with wound cleanser or normal saline prior to applying a clean dressing using gauze sponges, not tissue or cotton balls. Peri-Wound Care Topical Primary Dressing MediHoney Gel, tube 1.5 (oz) Discharge Instruction: Apply to wound bed as instructed Secondary Dressing Woven Gauze Sponge, Non-Sterile 4x4 in Discharge Instruction: Apply over primary dressing as directed. Secured With Elastic Bandage 4 inch (ACE bandage) Discharge Instruction: Secure with ACE bandage as directed. Kerlix Roll Sterile, 4.5x3.1 (in/yd) Discharge  Instruction: Secure with Kerlix as directed. Paper Tape, 2x10 (in/yd) Discharge Instruction: Secure dressing with tape as directed. Compression Wrap Compression Stockings Add-Ons Electronic Signature(s) Signed: 07/29/2020 4:35:52 PM By: Sandre Kitty Signed: 08/01/2020 5:39:21 PM By: [name] RN, BSN Entered By: Sandre Kitty on 07/29/2020 16:30:28 -------------------------------------------------------------------------------- Wound Assessment Details Patient Name: Date of Service: [name] 07/29/2020 12:30 PM Medical Record Number: [id number] Patient Account Number: [id number] Date of Birth/Sex: Treating RN: Jun 20, 1970 (50 y.o. Nancy Fetter Primary Care Elza Sortor: [name], [name] Other Clinician: Referring Cindi Ghazarian: Treating Georgeanna Radziewicz/Extender: Kalman Shan MO REIRA, [name] Weeks in Treatment: 99 Wound Status Wound Number: 50 Primary Diabetic Wound/Ulcer of the Lower Extremity Etiology: Wound Location: Left, Plantar Foot Wound Open Wounding Event: Skin Tear/Laceration Status: Date Acquired: 06/24/2020 Comorbid Cataracts, Chronic sinus problems/congestion, Anemia, Sleep Weeks  Of Treatment: 5 History: Apnea, Deep Vein Thrombosis, Hypertension, Peripheral Arterial Clustered Wound: No Disease, Type I Diabetes, Osteoarthritis, Osteomyelitis, Neuropathy, Seizure Disorder Photos Wound Measurements Length: (cm) 0.3 Width: (cm) 0.2 Depth: (cm) 0.3 Area: (cm) 0.047 Volume: (cm) 0.014 % Reduction in Area: 76% % Reduction in Volume: 64.1% Epithelialization: Medium (34-66%) Tunneling: No Undermining: Yes Starting Position (o'clock): 12 Ending Position (o'clock): 12 Maximum Distance: (cm) 0.4 Wound Description Classification: Grade 1 Wound Margin: Well defined, not attached Exudate Amount: Medium Exudate Type: Serosanguineous Exudate Color: red, brown Foul Odor After Cleansing: No Slough/Fibrino No Wound Bed Granulation Amount: Large (67-100%) Exposed Structure Granulation Quality: Red Fascia Exposed: No Necrotic Amount: None Present (0%) Fat Layer (Subcutaneous Tissue) Exposed: Yes Tendon Exposed: No Muscle Exposed: No Joint Exposed: No Bone Exposed: No Treatment Notes Wound #50 (Foot) Wound Laterality: Plantar, Left Cleanser Wound Cleanser Discharge Instruction: Cleanse the wound with wound cleanser or normal saline prior to applying a clean dressing using gauze sponges, not tissue or cotton balls. Peri-Wound Care Topical Primary Dressing KerraCel Ag Gelling Fiber Dressing, 2x2 in (silver alginate) Discharge Instruction: Apply silver alginate to wound bed in clinic. pt may use silver honey spray at home Secondary Dressing Woven Gauze Sponge, Non-Sterile 4x4 in Discharge Instruction: Apply over primary dressing as directed. Secured With Elastic Bandage 4 inch (ACE bandage) Discharge Instruction: Secure with ACE bandage as directed. Kerlix Roll Sterile, 4.5x3.1 (in/yd) Discharge Instruction: Secure with Kerlix as directed. Paper Tape, 2x10 (in/yd) Discharge Instruction: Secure dressing with tape as directed. Compression Wrap Compression  Stockings Add-Ons Electronic Signature(s) Signed: 07/29/2020 4:35:52 PM By: Sandre Kitty Signed: 08/01/2020 5:39:21 PM By: [name] RN, BSN Entered By: Sandre Kitty on 07/29/2020 16:29:55 -------------------------------------------------------------------------------- Wound Assessment Details Patient Name: Date of Service: [name] 07/29/2020 12:30 PM Medical Record Number: [id number] Patient Account Number: [id number] Date of Birth/Sex: Treating RN: June 11, 1970 (50 y.o. Nancy Fetter Primary Care Sejal Cofield: [name], [name] Other Clinician: Referring Zuleyka Kloc: Treating Felica Chargois/Extender: Kalman Shan MO REIRA, [name] Weeks in Treatment: 99 Wound Status Wound Number: 80 Primary Diabetic Wound/Ulcer of the Lower Extremity Etiology: Wound Location: Left T Second oe Wound Open Wounding Event: Trauma Status: Date Acquired: 07/29/2020 Comorbid Cataracts, Chronic sinus problems/congestion, Anemia, Sleep Weeks Of Treatment: 0 History: Apnea, Deep Vein Thrombosis, Hypertension, Peripheral Arterial Clustered Wound: No Disease, Type I Diabetes, Osteoarthritis, Osteomyelitis, Neuropathy, Seizure Disorder Photos Wound Measurements Length: (cm) 1.5 Width: (cm) 1 Depth: (cm) 0.3 Area: (cm) 1.178 Volume: (cm) 0.353 % Reduction in Area: 0% % Reduction in Volume: 0% Epithelialization: None Tunneling: No Undermining: No Wound Description Classification: Grade 2 Wound Margin: Indistinct, nonvisible  Exudate Amount: Medium Exudate Type: Serosanguineous Exudate Color: red, brown Foul Odor After Cleansing: No Slough/Fibrino Yes Wound Bed Granulation Amount: Large (67-100%) Exposed Structure Granulation Quality: Pink Fascia Exposed: No Necrotic Amount: Small (1-33%) Fat Layer (Subcutaneous Tissue) Exposed: Yes Necrotic Quality: Adherent Slough Tendon Exposed: No Muscle Exposed: No Joint Exposed: No Bone Exposed: No Treatment Notes Wound #51  (Toe Second) Wound Laterality: Left Cleanser Soap and Water Discharge Instruction: May shower and wash wound with dial antibacterial soap and water prior to dressing change. Peri-Wound Care Topical Primary Dressing Promogran Prisma Matrix, 4.34 (sq in) (silver collagen) Discharge Instruction: Moisten collagen with saline or hydrogel Secondary Dressing Woven Gauze Sponges 2x2 in Discharge Instruction: Apply over primary dressing as directed. Secured With Conforming Stretch Gauze Bandage, Sterile 2x75 (in/in) Discharge Instruction: Secure with stretch gauze as directed. Paper Tape, 1x10 (in/yd) Discharge Instruction: Secure dressing with tape as directed. Compression Wrap Compression Stockings Add-Ons Electronic Signature(s) Signed: 07/29/2020 4:35:52 PM By: Sandre Kitty Signed: 08/01/2020 5:39:21 PM By: [name] RN, BSN Entered By: Sandre Kitty on 07/29/2020 16:29:23 -------------------------------------------------------------------------------- Vitals Details Patient Name: Date of Service: [name] 07/29/2020 12:30 PM Medical Record Number: [id number] Patient Account Number: [id number] Date of Birth/Sex: Treating RN: 07-02-1970 (50 y.o. Nancy Fetter Primary Care Korbin Mapps: [name], [name] Other Clinician: Referring Lakynn Halvorsen: Treating Kasten Leveque/Extender: Kalman Shan MO REIRA, [name] Weeks in Treatment: 57 Vital Signs Time Taken: 12:57 Temperature (F): 98.7 Height (in): 67 Pulse (bpm): 83 Weight (lbs): 125 Respiratory Rate (breaths/min): 16 Body Mass Index (BMI): 19.6 Blood Pressure (mmHg): 180/95 Capillary Blood Glucose (mg/dl): 150 Reference Range: 80 - 120 mg / dl Notes glucose per pt report Electronic Signature(s) Signed: 08/01/2020 5:39:21 PM By: [name] RN, BSN Entered By: [name] on 07/29/2020 12:58:32

## 2020-08-12 ENCOUNTER — Encounter (HOSPITAL_BASED_OUTPATIENT_CLINIC_OR_DEPARTMENT_OTHER): Payer: Medicare HMO | Attending: Internal Medicine | Admitting: Internal Medicine

## 2020-08-12 ENCOUNTER — Other Ambulatory Visit: Payer: Self-pay

## 2020-08-12 DIAGNOSIS — E10621 Type 1 diabetes mellitus with foot ulcer: Secondary | ICD-10-CM | POA: Insufficient documentation

## 2020-08-12 DIAGNOSIS — L97522 Non-pressure chronic ulcer of other part of left foot with fat layer exposed: Secondary | ICD-10-CM | POA: Diagnosis not present

## 2020-08-12 DIAGNOSIS — N189 Chronic kidney disease, unspecified: Secondary | ICD-10-CM | POA: Insufficient documentation

## 2020-08-12 DIAGNOSIS — E10610 Type 1 diabetes mellitus with diabetic neuropathic arthropathy: Secondary | ICD-10-CM | POA: Diagnosis not present

## 2020-08-12 DIAGNOSIS — Z87891 Personal history of nicotine dependence: Secondary | ICD-10-CM | POA: Diagnosis not present

## 2020-08-12 DIAGNOSIS — E1022 Type 1 diabetes mellitus with diabetic chronic kidney disease: Secondary | ICD-10-CM | POA: Insufficient documentation

## 2020-08-12 DIAGNOSIS — E1069 Type 1 diabetes mellitus with other specified complication: Secondary | ICD-10-CM | POA: Diagnosis not present

## 2020-08-12 DIAGNOSIS — I120 Hypertensive chronic kidney disease with stage 5 chronic kidney disease or end stage renal disease: Secondary | ICD-10-CM | POA: Insufficient documentation

## 2020-08-12 DIAGNOSIS — E1051 Type 1 diabetes mellitus with diabetic peripheral angiopathy without gangrene: Secondary | ICD-10-CM | POA: Insufficient documentation

## 2020-08-12 DIAGNOSIS — G40909 Epilepsy, unspecified, not intractable, without status epilepticus: Secondary | ICD-10-CM | POA: Insufficient documentation

## 2020-08-12 DIAGNOSIS — Z9641 Presence of insulin pump (external) (internal): Secondary | ICD-10-CM | POA: Diagnosis not present

## 2020-08-12 DIAGNOSIS — Z86718 Personal history of other venous thrombosis and embolism: Secondary | ICD-10-CM | POA: Diagnosis not present

## 2020-08-12 DIAGNOSIS — M86671 Other chronic osteomyelitis, right ankle and foot: Secondary | ICD-10-CM | POA: Diagnosis not present

## 2020-08-12 DIAGNOSIS — L97514 Non-pressure chronic ulcer of other part of right foot with necrosis of bone: Secondary | ICD-10-CM | POA: Insufficient documentation

## 2020-08-12 DIAGNOSIS — Z992 Dependence on renal dialysis: Secondary | ICD-10-CM | POA: Insufficient documentation

## 2020-08-12 NOTE — Progress Notes (Signed)
DRUANNE, BOSQUES ([id number]) Visit Report for 08/12/2020 Debridement Details Patient Name: Date of Service: [name] 08/12/2020 12:30 PM Medical Record Number: [id number] Patient Account Number: [id number] Date of Birth/Sex: Treating RN: 03-01-1971 (50 y.o. [name] Primary Care Provider: [name], [name] Other Clinician: Referring Provider: Treating Provider/Extender: [name], [name] Weeks in Treatment: 101 Debridement Performed for Assessment: Wound #50 [place] Performed By: Physician Ricard Dillon., MD Debridement Type: Debridement Severity of Tissue Pre Debridement: Fat layer exposed Level of Consciousness (Pre-procedure): Awake and Alert Pre-procedure Verification/Time Out Yes - 13:10 Taken: Start Time: 13:11 Pain Control: Other : Benzocaine T Area Debrided (L x W): otal 0.4 (cm) x 0.3 (cm) = 0.12 (cm) Tissue and other material debrided: Non-Viable, Eschar, Subcutaneous Level: Skin/Subcutaneous Tissue Debridement Description: Excisional Instrument: Curette Bleeding: Minimum Hemostasis Achieved: Pressure End Time: 13:14 Response to Treatment: Procedure was tolerated well Level of Consciousness (Post- Awake and Alert procedure): Post Debridement Measurements of Total Wound Length: (cm) 0.4 Width: (cm) 0.3 Depth: (cm) 0.1 Volume: (cm) 0.009 Character of Wound/Ulcer Post Debridement: Stable Severity of Tissue Post Debridement: Fat layer exposed Post Procedure Diagnosis Same as Pre-procedure Electronic Signature(s) Signed: 08/12/2020 5:00:10 PM By: Linton Ham MD Signed: 08/12/2020 5:32:23 PM By: Lorrin Jackson Entered By: Linton Ham on 08/12/2020 13:39:47 -------------------------------------------------------------------------------- Debridement Details Patient Name: Date of Service: [name] 08/12/2020 12:30 PM Medical Record Number: [id number] Patient Account Number: [id number] Date  of Birth/Sex: Treating RN: [birth year] (50 y.o. [name] Primary Care Provider: [name], [name] Other Clinician: Referring Provider: Treating Provider/Extender: [name], [name] Weeks in Treatment: 101 Debridement Performed for Assessment: Wound #51 [place] o[place] Performed By: Physician Ricard Dillon., MD Debridement Type: Debridement Severity of Tissue Pre Debridement: Fat layer exposed Level of Consciousness (Pre-procedure): Awake and Alert Pre-procedure Verification/Time Out Yes - 13:10 Taken: Start Time: 13:14 Pain Control: Other : Benzocaine T Area Debrided (L x W): otal 1 (cm) x 1 (cm) = 1 (cm) Tissue and other material debrided: Subcutaneous Level: Skin/Subcutaneous Tissue Debridement Description: Excisional Instrument: Curette Bleeding: Minimum Hemostasis Achieved: Pressure End Time: 13:18 Response to Treatment: Procedure was tolerated well Level of Consciousness (Post- Awake and Alert procedure): Post Debridement Measurements of Total Wound Length: (cm) 1 Width: (cm) 1 Depth: (cm) 0.1 Volume: (cm) 0.079 Character of Wound/Ulcer Post Debridement: Stable Severity of Tissue Post Debridement: Fat layer exposed Post Procedure Diagnosis Same as Pre-procedure Electronic Signature(s) Signed: 08/12/2020 5:00:10 PM By: Linton Ham MD Signed: 08/12/2020 5:32:23 PM By: Lorrin Jackson Entered By: Linton Ham on 08/12/2020 13:39:58 -------------------------------------------------------------------------------- HPI Details Patient Name: Date of Service: [name] 08/12/2020 12:30 PM Medical Record Number: [id number] Patient Account Number: [id number] Date of Birth/Sex: Treating RN: [birth year] (50 y.o. [name] Primary Care Provider: [name], [name] Other Clinician: Referring Provider: Treating Provider/Extender: [name], [name] Weeks in Treatment: 101 History of Present Illness HPI  Description: 50 year old diabetic who is known to have type 1 diabetes which is poorly controlled last hemoglobin A1c was 11%. She comes in with a ulcerated area on the left lateral foot which has been there for over 6 months. Was recently she has been treated by [name] of podiatry who saw her last on 05/28/2016. Review of his notes revealed that the patient had incision and drainage with placement of antibiotic beads to the left foot  on 04/11/2016 for possible osteomyelitis of the cuboid bone. Over the last year she's had a history of amputation of the left fifth toe and a femoropopliteal popliteal bypass graft somewhere in April 2017. 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 His note [name] mentions that the patient has been referred to me for further wound care and possibly great candidate for hyperbaric oxygen therapy due to recurrent osteomyelitis. However we do not have any x-rays of biopsy reports confirming this. He has been on several antibiotics including Bactrim and most recently is on doxycycline for an MRSA. I understand, the patient was not a candidate for IV antibiotics as she has had previous PICC lines which resulted in blood clots in both arms. There was a x-ray report dated 04/04/2016 on [name] notes which showed evidence of fifth ray resection left foot with osteolytic changes noted to the fourth metatarsal and cuboid bone on the left. 06/13/2016 -- had a left foot x-ray which showed no acute fracture or dislocation and no definite radiographic evidence of osteomyelitis. Advanced osteopenia was seen. 06/20/2016 -- she has noticed a new wound on the right plantar foot in the region where she had a callus before. 06/27/16- the patient did have her x-ray of the right foot which showed no findings to suggest osteomyelitis. She saw her endocrinologist, [name], yesterday. Her A1c in January was 11. He also indicates mismanagement and noncompliance regarding her  diabetes. She is currently on Bactrim for a lip infection. She is complaining of nausea, vomiting and diarrhea. She is unable to articulate the exact orders or dosing of the Bactrim; it is unclear when she will complete this. 07/04/2016 -- results from [facility] of ABIs with ankle waveforms were noted from 02/14/2016. The examination done on 06/27/2015 showed noncompressible ABIs with the right being 1.45 and the left being 1.33. The present examination showed a right ABI of 1.19 on the left of 1.33. The conclusion was that right normal ABI in the lower extremity at rest however compared to previous study which was noncompressible ABI may be falsely elevated side suggesting medial calcification. The left ABI suggested medial calcification. 08/01/2016 -- the patient had more redness and pain on her right foot and did not get to come to see as noted she see her PCP or go to the ER and decided to take some leftover metronidazole which she had at home. As usual, the patient does report she feels and is rather noncompliant. 08/08/2016 -- -- x-ray of the right foot -- FINDINGS:Transmetatarsal amputation is noted. No bony destruction is noted to suggest osteomyelitis. IMPRESSION: No evidence of osteomyelitis. Postsurgical changes are seen. MRI would be more sensitive for possible bony changes. Culture has grown Serratia Marcescens -- sensitive to Bactrim, ciprofloxacin, ceftazidime she was seen by [name] on 08/06/2016. He did not find any exposed bone, muscle, tendon, ligament or joint. There was no malodor and he did a excisional debridement in the office. ============ Old notes: 50 year old patient who is known to the wound clinic for a while had been away from the wound clinic since 09/01/2014. Over the last several months she has been admitted to various hospitals including [facility]. She was treated for a right metatarsal osteomyelitis with a transmetatarsal  amputation and this was done about 2 months ago. He has a small ulcerated area on the right heel and she continues to have an ulcerated area on the left plantar aspect of the foot. The patient was recently  admitted to the [facility] hospital group between 7/12 and 10/18/2014. she was given 3 weeks of IV vancomycin and was to follow-up with her surgeons at [facility] and also took oral vancomycin for C. difficile colitis. 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 MRI of the left foot done on 10/14/2014 showed no abscess or osteomyelitis. 04/27/15; this is a patient we know from previous stays in the wound care center. She is a type I diabetic I am not sure of her control currently. Since the last time I saw her she is had a right transmetatarsal amputation and has no wounds on her right foot and has no open wounds. She is been followed at the wound care center at [facility] in [place]. She comes today with the desire to undergo hyperbaric treatment locally. Apparently one of her wound care providers in [place] has suggested hyperbarics. This is in response to an MRI from 04/18/15 that showed increased marrow signal and loss of the proximal fifth metatarsal cortex evidence of osteomyelitis with likely early osteomyelitis in the cuboid bone as well. She has a large wound over the base of the fifth metatarsal. She also has a eschar over her the tips of her toes on 1,3 and 5. She does not have peripheral pulses and apparently is going for an angiogram tomorrow which seems reasonable. After this she is going to infectious disease at [facility]. They have been using Medihoney to the large wound on the lateral aspect of  the left foot to. The patient has known Charcot deformity from diabetic neuropathy. She also has known diabetic PAD. Surprisingly I can't see that she has had any recent antibiotics, the patient states the last antibiotic she had was at the end of November for 10 days. I think this was in response to culture that showed group G strep although I'm not exactly sure where the culture was from. She is also had arterial studies on 03/29/15. This showed a right ABI of 1.4 that was noncompressible. Her left ABI was 0.73. 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 It was not felt that arterial inflow was adequate for healing of a foot ulcer. Her Doppler waveforms looked monophasic ===== READMISSION 02/28/17; this is in an now 50 year old woman we've had at several different occasions in this clinic. 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 She has a remote ex-smoker. She was last seen in this clinic by Dr. Con Memos I think in May. More recently she is been followed by her podiatrist [name] an infectious disease Dr. Megan Salon. She has 2 open wounds the major one is over the right first metatarsal head she also has a wound on the left plantar foot. an MRI of the right foot on 01/01/17 showed a soft tissue ulcer along the plantar aspect of the first metatarsal base consistent with osteomyelitis of the first metatarsal stump. Dr. 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 According to the patient this is been open for slightly over a month. She has been on a combination of Cipro 500 twice a day, Zyvox 600 twice a day and Flagyl 500 3 times a day for over a month now as directed by  Dr. Megan Salon. cultures of the right foot earlier this year showed MRSA in January and Serratia in May. January also had a few viridans strep. Recent x-rays of both feet were done and [name] office and I  don't have these reports. The patient has known PAD and has a history of aleft femoropopliteal bypass in April 2017. 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 Dwyane Dee. In spite of this the last hemoglobin A1c I can see is 10.1 on 01/01/2017. She is being referred by 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 She is been using Medihoney to this area and also an area on the left midfoot. She is using healing sandals bilaterally. ABIs in this clinic at the left posterior tibial was 1.1 noncompressible on the right READMISSION Non invasive vascular NOVANT 5/18 Aftercare following surgery of the circulatory system Procedure Note - Interface, External Ris In - 08/13/2016 11:05 AM EDT Procedure: Examination consists of physiologic resting arterial pressures of the brachial and ankle arteries bilaterally with continuous wave Doppler waveform analysis. Previous: Previous exam performed on 02/14/16 demonstrated ABIs of Rt = 1.19 and Lt = 1.33. Right: ABI = non-compressible PT 1.47 DP. S/P transmet amputation. , Left: ABI = 1.52, 2nd digit pressure = 87 mmHg Conclusions: Right: ABI (>1.3) may be falsely elevated, suggesting medial calcification. Left: ABI (>1.3) may be falsely elevated, suggesting medial calcification The patient is a now 49 year old type I diabetic is had multiple issues her graded to chronic diabetic foot ulcers. 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 We had her in the clinic lastin November. At that point she had wounds on her bilateral feet.she had wanted to try hyperbarics however the healogics review process denied her because she hadn't followed up with  her vascular surgeon for her left femoropopliteal bypass. The bypass was done by Dr. Raul Del at [facility]. We made her a follow-up with Dr. Raul Del however she did not keep the appointment and therefore she was not approved The patient shows me a small wound on her left fourth metatarsal head on her phone. She developed rapid discoloration in the plantar aspect of the left foot and she was admitted to hospital from 2/2 through 05/10/17 with wet gangrene of the left foot osteomyelitis of the fourth metatarsal heads. She was admitted acutely ill with a temperature of 103. She was started on broad-spectrum vancomycin and cefepime. On 05/06/17 she was taken to the OR by [name] her podiatric surgeon for an incision and drainage irrigation of the left foot wound. Cultures from this surgery revealed group be strep and anaerobes. she was seen by Dr.Xu of orthopedic surgery and scheduled for a below-knee amputation which she u refused. Ultimately she was discharged on Levaquin and Flagyl for one month. MRI 05/05/17 done while she was in the hospital showed abscess adjacent to the fourth metatarsal head and neck small abscess around the fourth flexor tendon. Inflammatory phlegmon and gas in the soft tissues along the lateral aspect of the fourth phalanx. Findings worrisome for osteomyelitis involving the fourth proximal and middle phalanx and also the third and fourth metatarsals. Finally the patient had actually shortly before this followed up with Dr. Raul Del at no time on 04/29/17.  He felt that her left femoropopliteal bypass was patent he felt that her left-sided toe pressures more than adequate for healing a wound on the left foot. This was before her acute presentation. 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 05/28/17; she is started hyperbarics. The patient tells me that for some reason she was not actually on Levaquin but I think on ciprofloxacin. She was on Flagyl. She only started her Levaquin  yesterday due to some difficulty with the pharmacy and perhaps her sister picking it up. She has an appointment with [name] tomorrow and with infectious disease early next week. She has no new complaints 06/06/17; the patient continues in hyperbarics. She saw [name] on 05/29/17 who is her podiatric surgeon. He is elected for a transmetatarsal amputation on 06/27/17. I'm not sure at what level he plans to do this amputation. The patient is unaware She also saw Dr. Megan Salon of infectious disease who elected to continue her on current antibiotics I think this is ciprofloxacin and Flagyl. I'll need to clarify with her tomorrow if she actually has this. We're using silver alginate to the actual wound. Necrotic surface today with material under the flap of her foot. Original MRI showed abscesses as well as osteomyelitis of the proximal and middle fourth phalanx and the third and fourth metatarsal heads 06/11/17; patient continues in hyperbarics and continues on oral antibiotics. She is doing well. The wound looks better. The necrotic part of this under the flap in her superior foot also looks better. she is been to see [name]. I haven't had a chance to look at his note. Apparently he has put the transmetatarsal amputation on hold her request it is still planning to take her to the OR for debridement and product application ACEL. I'll see if I can find his note. I'll therefore leave product ordering/requests to [name] for now. I was going to look at Dermagraft 06/18/17-she is here in follow-up evaluation for bilateral foot wounds. She continues with hyperbaric therapy. She states she has been applying manuka honey to the right plantar foot and alternate manuka honey and silver alginate to the left foot, despite our orders. We will continue with same treatment plan and she will follo up next week. 06/25/17; I have reviewed [name] last note from 3/11. She has operative debridement in 2 days' time. By  review his note apparently they're going to place there is skin over the majority of this wound which is a good choice. She has a small satellite area at the most proximal part of this wound on the left plantar foot. The area on the right plantar foot we've been using silver alginate and it is close to healing. 07/02/17; unfortunately the patient was not easily approved for [name] proposed surgery. I'm not completely certain what the issue is. She has been using silver alginate to the wound she has completed a first course of hyperbarics. She is still on Levaquin and Flagyl. I have really lost track of the time course here.I suspect she should have another week to 2 of antibiotics. I'll need to see if she is followed up with infectious disease Dr. Megan Salon 07/09/17; the patient is followed up with Dr. Megan Salon. 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 She continues on Levaquin and metronidazole continuing both of these for now I think she is been on fr about 6 weeks. She still has some drainage but no pain. No fever. Her  had been plans for her to go to the OR for operative debridement with her podiatrist [name], I am not exactly sure where that is. I'll probably slip a note to [name] today. I note that she follows with Dr. Dwyane Dee of endocrinology. We have her recertified for hyperbaric oxygen. I have not heard about Dermagraft however I'll see if [name] is planning a skin substitute as well 07/16/17; the patient tells me she is just about out of [place]. I'll need to check Dr. Hale Bogus last notes on this. She states she has plenty of Flagyl however. She comes in today complaining of pain in the right lateral foot which she said lasted for about a day. The wound on the right foot is actually much more medially. She also tells me that the [facility] cost a lot of pain in the left foot wound and she turned back to silver alginate. Finally Dermagraft  has a $644 per application co-pay. She cannot afford this 07/23/17; patient arrives today with the wound not much smaller. There is not much new to add. She has not heard from [name] all try to put in a call to them today. She was asking about Dermagraft again and she has an over $034 per application co-pay she states that she would be willing to try to do a payment plan. I been tried to avoid this. We've been using silver alginate, I'll change to [facility] 07/30/17-She is here in follow-up evaluation for left foot ulcer. She continues hyperbaric medicine. The left foot ulcer is stable we will continue with same treatment plan 08/06/17; she is here for evaluation of her left foot ulcer. Currently being treated for hyperbarics or underlying osteomyelitis. She is completed antibiotics. The left foot ulcer is better smaller with healthier looking granulation. For various reasons I am not really clear on we never got her back to the OR with [name]. He did not respond to my secure text message. Nevertheless I think that surgery on this point is not necessary nor am I completely clear that a skin substitute is necessary The patient is complaining about pain on the outside of her right foot. 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 There is no erythema. She also states the foot is warm versus her other part of her upper leg and this is largely true. It is not totally clear to me what's causing this. She thinks it's different from her usual neuropathy pain 08/13/17; she arrives in clinic today with a small wound which is superficial on her right first metatarsal head. 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 She tells [place] she was up on her feet over the Mother's Day celebration. The large wound is on the left foot. Continues with hyperbarics for underlying osteomyelitis. We're using Hydrofera Blue. She asked me today about where we were with Dermagraft. I had actually  excluded this because of the co-pay however she wants to assume this therefore I'll recheck the co-pay an order for next week. 08/20/17; the patient agreed to accept the co-pay of the first Apligraf which we applied today. She is disappointed she is finishing hyperbarics will run this through the insurance on the extent of the foot infection and the extent of the wound that she had however she is already had 60 dive's. Dermagraft No. 1 08/27/17; Dermagraft No. 2. She is not eligible for any more hyperbaric treatments this month. She reports a fair amount of drainage and she actually  changed to the external dressings without disturbing the direct contact layer 09/03/17; the patient arrived in clinic today with the wound superficially looking quite healthy. Nice vibrant red tissue with some advancing epithelialization although not as much adherence of the flap as I might like. However she noted on her own fourth toe some bogginess and she brought that to our attention. Indeed this was boggy feeling like a possibility of subcutaneous fluid. 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 She is not been unwell. We've been using Dermagraft 09/10/17; the culture that I did not last week was MRSA. She saw Dr. Megan Salon this morning who is going to start her on vancomycin. I had sent him a secure a text message yesterday. I also spoke with her podiatric surgeon [name] about surgery on this foot the options for conserving a functional foot etc. Promised me he would see her and will make back consultation today. Paradoxically her actual wound on the plantar aspect of her left foot looks really quite good. I had given her 5 days worth of Baxdella to cover her for MRSA. Her MRI came back showing osteomyelitis within the third metatarsal shaft and head and base of the third and fourth proximal phalanx. She had extensive inflammatory changes  throughout the soft tissue of the lateral forefoot. With an ill-defined fluid around the fourth metatarsal extending into the plantar and dorsal soft tissues 09/19/17; the patient is actually on oral Septra and Flagyl. She apparently refused IV vancomycin. She also saw [name] at my request who is planning her for a left BKA sometime in mid July. MRI showed osteomyelitis within the third metatarsal shaft and head and the basis of the third and fourth proximal phalanx. I believe there was felt to be possible septic arthritis involving the third MTP. 09/26/17; the patient went back to Dr. Megan Salon at my suggestion and is now receiving IV daptomycin. Her wound continues to look quite good making the decision to proceed with a transmetatarsal amputation although more difficult for the patient. I believe in my extensive discussions with her she has a good sense of the pros and cons of this. I don't NV the tuft decision she has to make. She has an appointment with [name] I believe in mid July and I previously spoken to him about this issue Has we had used 3 previous Dermagraft. Given the condition of the wound surface I went ahead and added the fourth one today area and I did this not fully realizing that she'll be traveling to [place] next week. I'm hopeful she can come back in 2 weeks 10/21/17; Her same Dermagraft on for about 3-1/2 weeks. In spite of this the wound arrives looking quite healthy. There is been a lot of healing dimensions are smaller. Looking at the square shaped wound she has now there is some undermining and some depth medially under the undermining although I cannot palpate any bone. No surrounding infection is obvious. She has difficult questions about how to look at this going forward vis--vis amputations versus continued medical therapy. T be truthful the wound is looks o so healthy and it is continued to contract. Hard to justify foot surgery at this point although I still told  her that I think it might come to that if we are not able to eradicate the underlying MRSA. She is still highly at risk and she understands this 11/06/17  on evaluation today patient appears to be doing better in regard to her foot ulcer. She's been tolerating the dressing changes without complication. Currently she is here for her Dermagraft #6. Her wound continues to make excellent progress at this point. She does not appear to have any evidence of infection which is good news. 11/13/17 on evaluation today patient appears to be doing excellent at this time. She is here for repeat Dermagraft application. This is #7. Overall her wound seems to be making great progress. 12/05/17; the patient arrives with the wound in much better condition than when I last saw this almost 6 weeks ago. She still has a small probing area in the left metatarsal head region on the lateral aspect of her foot. We applied her last Dermagraft today. Since the last time she is here she has what appears to a been a blood blister on the plantar aspect of left foot although I don't see this is threatening. There is also a thick raised tissue on the right mid metatarsal head region. This was not there I don't think the last time she was here 3 weeks ago. 12/12/17; the patient continues to have a small programming area in the left metatarsal head region on the lateral aspect of her foot which was the initial large surgical wound. I applied her last Apligraf last week. I'm going to use Endoform starting today Unfortunately she has an excoriated area in the left mid foot and the right mid foot. The left midfoot looks like a blistered area this was not opened last week it certainly is open today. Using silver alginate on these areas. She promises me she is offloading this. 12/19/17; the small probing area in the left metatarsal head eyes think is shallower. In general her original wound looks better. We've been using Endoform. The area  inferiorly that I think was trauma last week still requires debridement a lot of nonviable surface which I removed. She still has an open open area distally in her foot Similarly on the right foot there is tightly adherent surface debris which I removed. Still areas that don't look completely epithelialized. This is a small open area. We used silver alginate on these areas 12/26/2017; the patient did not have the supplies we ordered from last week including the Endoform. The original large wound on the left lateral foot looks healthy. She still has the undermining area that is largely unchanged from last week. She has the same heavily callused raised edged wounds on the right mid and left midfoot. Both of these requiring debridement. We have been using silver alginate on these areas 01/02/2018; there is still supply issues. We are going to try to use Prisma but I am not sure she actually got it from what she is saying. She has a new open area on the lateral aspect of the left fourth toe [previous fifth ray amputation]. Still the one tunneling area over the fourth metatarsal head. The area is in the midfoot bilaterally still have thick callus around them. She is concerned about a raised swelling on the lateral aspect of the foot. However she is completely insensate 01/10/2018; we are using Prisma to the wounds on her bilateral feet. Surprisingly the tunneling area over the left fourth metatarsal head that was part of her original surgery has closed down. She has a small open area remaining on the incision line. 2 open areas in the midfoot. 02/10/2018; the patient arrives back in clinic after a month hiatus. She was  traveling to visit family in [place]. Is fairly clear she was not offloading the areas on her feet. The original wound over the left lateral foot at the level of metatarsal heads is reopened and probes medially by about a centimeter or 2. She notes that a week ago she had purulent drainage  come out of an area on the left midfoot. Paradoxically the worst area is actually on the right foot is extensive with purulent drainage. We will use silver alginate today 02/17/2018; the patient has 3 wounds one over the left lateral foot. She still has a small area over the metatarsal heads which is the remnant of her original surgical wound. This has medial probing depth of roughly 1.4 cm somewhat better than last week. The area on the right foot is larger. We have been using silver alginate to all areas. The area on the right foot and left foot that we cultured last week showed both Klebsiella and Proteus. Both of these are quinolone sensitive. The patient put her's self on Bactrim and Flagyl that she had left hanging around from prior antibiotic usages. She was apparently on this last week when she arrived. I did not realize this. Unfortunately the Bactrim will not cover either 1 of these organisms. We will send in Cipro 500 twice daily for a week 03/04/2018; the patient has 2 wounds on the left foot one is the original wound which was a surgical wound for a deep DFU. At one point this had exposed bone. She still has an area over the fourth metatarsal head that probes about 1.4 cm although I think this is better than last week. I been using silver nitrate to try and promote tissue adherence and been using silver alginate here. She also has an area in the left midfoot. This has some depth but a small linear wound. Still requiring debridement. On the right midfoot is a circular wound. A lot of thick callus around this area. We have been using silver alginate to all wound areas She is completed the ciprofloxacin I gave her 2 weeks ago. 03/11/2018; the patient continues to have 2 open areas on the left foot 1 of which was the original surgical wound for a deep DFU. Only a small probing area remains although this is not much different from last week we have been using silver alginate. The other area is  on the midfoot this is smaller linear but still with some depth. We have been using silver alginate here as well On the right foot she has a small circular wound in the mid aspect. This is not much smaller than last time. We have been using silver alginate here as well 03/18/2018; she has 3 wounds on the left foot the original surgical wound, a very superficial wound in the mid aspect and then finally the area in the mid plantar foot. She arrives in today with a very concerning area in the wound in the mid plantar foot which is her most proximal wound. There is undermining here of roughly 1-1/2 cm superiorly. Serosanguineous drainage. She tells me she had some pain on for over the weekend that shot up her foot into her thigh and she tells me that she had a nodule in the groin area. She has the single wound in the right foot. We are using endoform to both wound areas 03/24/2018; the patient arrives with the original surgical wound in the area on the left midfoot about the same as last week. There is a  collection of fluid under the surface of the skin extending from the surgical wound towards the midfoot although it does not reach the midfoot wound. The area on the right foot is about the same. Cultures from last week of the left midfoot wound showed abundant Klebsiella abundant Enterococcus faecalis and moderate methicillin resistant staph I gave her Levaquin but this would have only covered the Klebsiella. She will need linezolid 04/01/2018; she is taking linezolid but for the first few days only took 1 a day. I have advised her to finish this at twice daily dosing. In any case all of her wounds are a lot better especially on the left foot. The original surgical wound is closed. The area on the left midfoot considerably smaller. The area on the right foot also smaller. 04/08/2018; her original surgical wound/osteomyelitis on the left foot remains closed. She has area on the left foot that is in the  midfoot area but she had some streaking towards this. This is not connected with her original wound at least not visually. Small wound on the right midfoot appears somewhat smaller. 04/15/18; both wounds looks better. Original wound is better left midfoot. Using silver alginate 1/21; patient states she uses saltwater soak in, stones or remove callus from around her wounds. She is also concerned about a blood blister she had on the left foot but it simply resolved on its own. We've been using silver alginate 1/28; the patient arrives today with the same streaking area from her metatarsals laterally [the site of her original surgical wound] down to the middle of her foot. There is some drainage in the subcutaneous area here. This concerns me that there is actually continued ongoing infection in the metatarsals probably the fourth and third. This fixates an MRI of the foot without contrast [chronic renal failure] The wound in the mid part of the foot is small but I wonder whether this area actually connects with the more distal foot. The area on the right midfoot is probably about the same. Callus thick skin around the small wound which I removed with a curette we have been using silver alginate on both wound areas 2/4; culture I did of the draining site on the left foot last time grew methicillin sensitive staph aureus. MRI of the left foot showed interval resolution of the findings surrounding the third metatarsal joint on the prior study consistent with treated osteomyelitis. Chronic soft tissue ulceration in the plantar and lateral aspect of the forefoot without residual focal fluid collection. No evidence of recurrent osteomyelitis. Noted to have the previous amputation of the distal first phalanx and fifth ray MRI of the right foot showed no evidence of osteomyelitis I am going to treat the patient with a prolonged course of antibiotics directed against MSSA in the left foot 2/11; patient  continues on cephalexin. She tells me she had nausea and vomiting over the weekend and missed 2 days. In general her foot looks much the same. She has a small open area just below the left fourth metatarsal head. A linear area in the left midfoot. Some discoloration extending from the inferior part of this into the left lateral foot although this appears to be superficial. She has a small area on the right midfoot which generally looks smaller after debridement 2/18; the patient is completing his cephalexin and has another 2 days. She continues to have open areas on the left and right foot. 2/25; she is now off antibiotics. The area on the left foot  at the site of her original surgical wound has closed yet again. She still has open areas in the mid part of her foot however these appear smaller. The area on the right mid foot looks about the same. We have been using silver alginate She tells me she had a serious hypoglycemic spell at home. She had to have EMS called and get IV dextrose 3/3; disappointing on the left lateral foot large area of necrotic tissue surrounding the linear area. This appears to track up towards the same original surgical wound. Required extensive debridement. The area on the right plantar foot is not a lot better also using silver 3/12; the culture I did last time showed abundant enterococcus. I have prescribed Augmentin, should cover any unrecognized anaerobes as well. In addition there were a few MRSA and Serratia that would not be well covered although I did not want to give her multiple antibiotics. She comes in today with a new wound in the right midfoot this is not connected with the original wound over her MTP a lot of thick callus tissue around both wounds but once again she said she is not walking on these areas 3/17-Patient comes in for follow-up on the bilateral plantar wounds, the right midfoot and the left plantar wound. Both these are heavily callused surrounding  the wounds. We are continuing to use silver alginate, she is compliant with offloading and states she uses a wheelchair fairly often at home 3/24; both wound areas have thick callus. However things actually look quite a bit better here for the majority of her left foot and the right foot. 3/31; patient continues to have thick callused somewhat irritated looking tissue around the wounds which individually are fairly superficial. There is no evidence of surrounding infection. We have been using silver alginate however I change that to [facility] today 4/17; patient returns to clinic after having a scare with Covid she tested negative in her primary doctor's office. She has been using Hydrofera Blue. She does not have an open area on the right foot. On the left foot she has a small open area with the mid area not completely viable. She showed me pictures of what looks like a hemorrhagic blister from several days ago but that seems to have healed over this was on the lateral left foot 4/21; patient comes in to clinic with both her wounds on her feet closed. However over the weekend she started having pain in her right foot and leg up into the thigh. She felt as though she was running a low-grade fever but did not take her temperature. She took a doxycycline that she had leftover and yesterday a single Septra and metronidazole. She thinks things feel somewhat better. 4/28; duplex ultrasound I ordered last week was negative for DVT or superficial thrombophlebitis. She is completed the doxycycline I gave her. States she is still having a lot of pain in the right calf and right ankle which is no better than last week. She cannot sleep. She also states she has a temperature of up to 101, coughing and complaining of visual loss in her bilateral eyes. Apparently she was tested for Covid 2 weeks ago at [facility] and that was negative. Readmission: 09/03/18 patient presents back for reevaluation after having been  evaluated at the end of April regarding erythema and swelling of her right lower extremity. Subsequently she ended up going to the hospital on 07/29/18 and was admitted not to be discharged until 08/08/18. Unfortunately it was  noted during the time that she was in the hospital that she did have methicillin-resistant Staphylococcus aureus as the infection noted at the site. It was also determined that she did have osteomyelitis which appears to be fairly significant. She was treated with vancomycin and in fact is still on IV vancomycin at dialysis currently. This is actually slated to continue until 09/12/18 at least which will be the completion of the six weeks of therapy. Nonetheless based on what I'm seeing at this point I'm not sure she will be anywhere near ready to discontinue antibiotics at that time. Since she was released from the hospital she was seen by [name] who is her podiatrist on 08/27/18. His note specifically states that he is recommended that the patient needs of one knee amputation on the right as she has a life- threatening situation that can lead quickly to sepsis. The patient advised she would like to try to save her leg to which [name] apparently told her that this was against all medical advice. She also want to discontinue the Wound VAC which had been initiated due to the fact that she wasn't pleased with how the wound was looking and subsequently she wanted to pursue applying Medihoney at that time. He stated that he did not believe that the right lower extremity was salvageable and that the patient understood but would still like to attempt hyperbaric option therapy if it could be of any benefit. She was therefore referred back to [place] for further evaluation. He plans to see her back next week. Upon inspection today patient has a significant amount purulent drainage noted from the wound at this point. The bone in the distal portion of her foot also appears to be extremely  necrotic and spongy. When I push down on the bone it bubbles and seeps purulent drainage from deeper in the end of the foot. I do not think that this is likely going to heal very well at all and less aggressive surgical debridement were undertaken more than what I believe we can likely do here in our office. 09/12/2018; I have not seen this patient since the most recent hospitalization although she was in our clinic last week. I have reviewed some of her records from a complex hospitalization. 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 There is situation was complicated by MRSA bacteremia and acute on chronic renal failure now on dialysis. She is receiving vancomycin at dialysis. We started her on Dakin's wet-to-dry last week she is changing this daily. There is still purulent drainage coming out of her foot. Although she is apparently "agreeable" to a below-knee amputation which is been suggested by multiple clinicians she wants this to be done in [place]. She apparently has a telehealth visit with that provider sometime in late [place] 6/24. I have told her I think this is probably too long. Nevertheless I could not convince her to allow a local doctor to perform BKA. 09/19/2018; the patient has a large necrotic area on the right anterior foot. She has had previous transmetatarsal amputations. Culture I did last week showed MRSA nothing else she is on vancomycin at dialysis. She has continued leaking purulent drainage out of the distal part of the large circular wound on the right anterior foot. She apparently went to see Dr. 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 Somehow that translated into her being referred to plastic surgery for debridement of the area. I  gather she basically refused amputation although I do not have a copy of Dr. Berenice Primas notes. The patient really wants to have a trial of hyperbaric oxygen. I  agreed with initial assessment in this clinic that this was probably too far along to benefit however if she is going to have plastic surgery I think she would benefit from ancillary hyperbaric oxygen. The issue here is that the patient has benefited as maximally as any patient I have ever seen from hyperbaric oxygen therapy. Most recently she had exposed bone on the lateral part of her left foot after a surgical procedure and that actually has closed. She has eschared areas in both heels but no open area. She is remained systemically well. I am not optimistic that anything can be done about this but the patient is very clear that she wants an attempt. The attempt would include a wound VAC further debridements and hyperbaric oxygen along with IV antibiotics. 6/26; I put her in for a trial of hyperbaric oxygen only because of the dramatic response she has had with wounds on her left midfoot earlier this year which was a surgical wound that went straight to her bone over the metatarsal heads and also remotely the left third toe. We will see if we can get this through our review process and insurance. She arrives in clinic with again purulent material pouring out of necrotic bone on the top of the foot distally. There is also some concerning erythema on the front of the leg that we marked. It is bit difficult to tell how tender this is because of neuropathy. I note from infectious disease that she had her vancomycin extended. 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 She had the wound VAC on for part of the week. The rest of the time she is putting various things on this including Medihoney, "ionized water" silver sorb gel etc. 7/7; follow-up along with HBO. She is still on vancomycin at dialysis. She has a large open area on the dorsal right foot and a small dark eschar area on her heel. There is a lot less erythema in the area and a lot less tenderness. From an infection  point of view I think this is better. She still has a lot of necrosis in the remaining right forefoot [previous TMA] we are still using the wound VAC in this area 7/16; follow-up along with HBO. I put her on linezolid after she finished her vancomycin. We started this last Friday I gave her 2 weeks worth. I had the expectation that she would be operatively debrided by Dr. Marla Roe but that still has not happened yet. Patient phoned the office this week. She arrives for review today after HBO. The distal part of this wound is completely necrotic. Nonviable pieces of tendon bone was still purulent drainage. Also concerning that she has black eschar over the heel that is expanding. I think this may be indicative of infection in this area as well. She has less erythema and warmth in the ankle and calf but still an abnormal exam 7/21 follow-up along with HBO. I will renew her linezolid after checking a CBC with differential monitoring her blood counts especially her platelets. She was supposed to have surgery yesterday but if I am reading things correctly this was canceled after her blood sugar was found to be over 500. I thought Dr. Marla Roe who called me said that they were sending her to the ER but the patient states  that was not the case. 7/28. Follow-up along with HBO. She is on linezolid I still do not have any lab work from dialysis even though I called last week. The patient is concerned about an area on her left lateral foot about the level of the base of her fifth metatarsal. I did not really see anything that ominous here however this patient is in [place] ability to point out problems that she is sensing and she has been accurate in the past Finally she received a call from Dr. Marla Roe who is referring her to another orthopedic surgeon stating that she is too booked up to take her to the operating room now. Was still using a wound VAC on the foot 8/3 -Follow-up after HBO, she is got  another week of linezolid, she is to call ID for an appointment, x-rays of both feet were reviewed, the left foot x-ray with third MTP joint osteo- Right foot x-ray widespread osteo-in the right midfoot Right ankle x-ray does not show any active evidence of infection 8/11-Patient is seen after HBO, the wounds on the right foot appear to be about the same, the heel wound had some necrotic base over tendon that was debrided with a curette 8/21; patient is seen after HBO. The patient's wound on her dorsal foot actually looks reasonably good and there is substantial amount of epithelialization however the open area distally still has a lot of necrotic debris partially bone. I cannot really get a good sense of just how deep this probes under the foot. She has been pressuring me this week to order medical maggots through a company in [place] for her. The problem I have is there is not a defined wound area here. On the positive side there is no purulence. She has been to see infectious disease she is still on Septra DS although I have not had a chance to review their notes 8/28; patient is seen in conjunction with HBO. The wounds on her foot continued to improve including the right dorsal foot substantially the, the distal part of this wound and the area on the right heel. We have been using a wound VAC over this chronically. She is still on trimethoprim as directed by infectious disease 9/4; patient is seen in conjunction with HBO. Right dorsal foot wound substantially anteriorly is better however she continues to have a deep wound in the distal part of this that is not responding. We have been using silver collagen under border foam Area on the right plantar medial heel seems better. We have been using Hydrofera Blue 12/12/18 on evaluation today patient appears to be doing about the same with regard to her wound based on prior measurements. She does have some necrotic tissue noted on the lateral aspect  of the wound that is going require a little bit of sharp debridement today. This includes what appears to be potentially either severely necrotic bone or tendon. Nonetheless other than that she does not appear to have any severe infection which is good news 9/18; it is been 2 weeks since I saw this wound. She is tolerating HBO well. Continued dramatic improvement in the area on the right dorsal foot. She still has a small wound on the heel that we have been using Hydrofera Blue. She continues with a wound VAC 9/24; patient has to be seen emergently today with a swelling on her right lateral lower leg. She says that she told Dr. Evette Doffing about this and also myself on a couple of  occasions but I really have no recollection of this. She is not systemically unwell and her wound really looked good the last time I saw this. She showed this to providers at dialysis and she was able to verify that she was started on cephalexin today for 5 doses at dialysis. She dialyzes on Tuesday Thursday and Saturday. 10/2; patient is seen in conjunction with HBO. The area that is draining on the right anterior medial tibia is more extensive. Copious amounts of serosanguineous drainage with some purulence. We are still using the wound VAC on the original wound then it is stable. Culture I did of the original IandD showed MRSA I contacted dialysis she is now on vancomycin with dialysis treatments. I asked them to run a month 10/9; patient seen in conjunction with HBO. She had a new spontaneous open area just above the wound on the right medial tibia ankle. More swelling on the right medial tibia. Her wound on the foot looks about the same perhaps slightly better. There is no warmth spreading up her leg but no obvious erythema. her MRI of the foot and ankle and distal tib-fib is not booked for next Friday I discussed this with her in great detail over multiple days. it is likely she has spreading infection upper leg at least  involving the distal 25% above the ankle. She knows that if I refer her to orthopedics for infectious disease they are going to recommend amputation and indeed I am not against this myself. We had a good trial at trying to heal the foot which is what she wanted along with antibiotics debridement and HBO however she clearly has spreading infection [probably staph aureus/MRSA]. Nevertheless she once again tells me she wants to wait the left of the MRI. She still makes comments about having her amputation done in [place]. 10/19; arrives today with significant swelling on the lateral right leg. Last culture I did showed Klebsiella. Multidrug-resistant. Cipro was intermediate sensitivity and that is what I have her on pending her MRI which apparently is going to be done on Thursday this week although this seems to be moving back and forth. She is not systemically unwell. We are using silver alginate on her major wound area on the right medial foot and the draining areas on the right lateral lower leg 10/26; MRI showed extensive abscess in the anterior compartment of the right leg also widespread osteomyelitis involving osseous structures of the midfoot and portions of the hindfoot. Also suspicion for osteomyelitis anterior aspect of the distal medial malleolus. Culture I did of the purulence once again showed a multidrug-resistant Klebsiella. I have been in contact with nephrology late last week and she has been started on cefepime at dialysis to replace the vancomycin We sent a copy of her MRI report to Dr. Geroge Baseman in [place] who is an orthopedic surgeon. The patient takes great stock in his opinion on this. She says she will go to [place] to have her leg amputated if Dr. Geroge Baseman does not feel there is any salvage options. 11/2; she still is not talk to her orthopedic surgeon in [place]. Apparently he will call her at [phone] this afternoon. The quality of this is she has not allowed me to refer  her anywhere. She has been told over and over that she needs this amputated but has not agreed to be referred. She tells me her blood sugar was 600 last night but she has not been febrile. 11/9; she never did got a call from  the orthopedic surgeon in [place] therefore that is off the radar. We have arranged to get her see orthopedic surgery at [facility]. She still has a lot of draining purulence coming out of the new abscess in her right leg although that probably came from the osteomyelitis in her right foot and heel. Meanwhile the original wound on the right foot looks very healthy. Continued improvement. The issue is that the last MRI showed osteomyelitis in her right foot extensively she now has an abscess in the right anterior lower leg. There is nobody in [place] who will offer this woman anything but an amputation and to be honest that is probably what she needs. I think she still wants to talk about limb salvage although at this point I just do not see that. She has completed her vancomycin at dialysis which was for the original staph aureus she is still on cefepime for the more recent Klebsiella. She has had a long course of both of these antibiotics which should have benefited the osteomyelitis on the right foot as well as the abscess. 11/16; apparently [place] elective surgery is shut down because of COVID-19 pandemic. I have reached out to some contacts at [facility] to see if we can get her an orthopedic appointment there. I am concerned about continually leaving this but for the moment everything is static. In fact her original large wound on this foot is closing down. It is the abscess on the right anterior leg that continues to drain purulent serosanguineous material. She is not currently on any antibiotics however she had a prolonged course of vancomycin [1 month] as well as cefepime for a month 02/24/2019 on evaluation today patient appears to be doing better than the last  time I saw her. This is not a patient that I typically see. With that being said I am covering for Dr. Dellia Nims this week and again compared to when I last saw her overall the wounds in particular seem to be doing significantly better which is good news. With that being said the patient tells me several disconcerting things. She has not been able to get in to see anyone for potential debridement in regard to her leg wounds although she tells me that she does not think it is necessary any longer because she is taking care of that herself. She noticed a string coming out of the lower wound on her leg over the last week. The patient states that she subsequently decided that we must of pack something in there and started pulling the string out and as it kept coming and coming she realized this was likely her tendon. With that being said she continued to remove as much of this as she could. She then I subsequently proceeded to using tubes of antibiotic ointment which she will stick down into the wound and then scored as much as she can until she sees it coming out of the other wound opening. She states that in doing this she is actually made things better and there is less redness and irritation. With regard to her foot wound she does have some necrotic tendon and tissue noted in one small corner but again the actual wound itself seems to be doing better with good granulation in general compared to my last evaluation. 12/7; continued improvement in the wound on the substantial part of the right medial foot. Still a necrotic area inferiorly that required debridement but the rest of this looks very healthy and is contracting. She has 2  wounds on the right lateral leg which were her original drainage sites from her abscess but all of this looks a lot better as well. She has been using silver alginate after putting antibiotic biotic ointment in one wound and watching it come out the other. I have talked to her in  some detail today. I had given her names of orthopedic surgeons at [facility] for second opinion on what to do about the right leg. I do not think the patient never called them. She has not been able to get a hold of the orthopedic surgeon in [place] that she had put a lot of faith in as being somebody would give her an opinion that she would trust. I talked to her today and said even if I could get her in to another orthopedic surgeon about the leg which she accept an amputation and she said she would not therefore I am not going to press this issue for the moment 12/14; continued improvement in his substantial wound on the right medial foot. There is still a necrotic area inferiorly with tightly adherent necrotic debris which I have been working on debriding each time she is here. She does not have an orthopedic appointment. Since last time she was here I looked over her cultures which were essentially MRSA on the foot wound and gram-negative rods in the abscess on the anterior leg. 12/21; continued improvement in the area on the right medial foot. She is not up on this much and that is probably a good thing since I do not know it could support continuous ambulation. She has a small area on the right lateral leg which were remanence of the IandD's I did because of the abscess. I think she should probably have prophylactic antibiotics I am going to have to look this over to see if we can make an intelligent decision here. In the meantime her major wound is come down nicely. Necrotic area inferiorly is still there but looks a lot better 04/06/2019; she has had some improvement in the overall surface area on the right medial foot somewhat narrowedr both but somewhat longer. The areas on the right lateral leg which were initial IandD sites are superficial. Nothing is present on the right heel. We are using silver alginate to the wound areas 1/18; right medial foot somewhat smaller. Still a deep probing  area in the most distal recess of the wound. She has nothing open on the right leg. She has a new wound on the plantar aspect of her left fourth toe which may have come from just pulling skin. The patient using Medihoney on the wound on her foot under silver alginate. I cannot discourage her from this 2/1; 2-week follow-up using silver alginate on the right foot and her left fourth toe. The area on the right dorsal foot is contracted although there is still the deep area in the most distal part of the wound but still has some probing depth. No overt infection 2/15; 2-week follow-up. She continues to have improvement in the surface area on the dorsal right foot. Even the tunneling area from last time is almost closed. The area that was on the plantar part of her left fourth toe over the PIP is indeed closed 3/1; 2-week follow-up. Continued improvement in surface area. The original divot that we have been debriding inferiorly I think has full epithelialization although the epithelialization is gone down into the wound with probably 4 mm of depth. Even under intense illumination I  am unable to see anything open here. The remanence of the wound in this area actually look quite healthy. We have been using silver alginate 3/15; 2-week follow-up. Unfortunately not as good today. She has a comma shaped wound on the dorsal foot however the upper part of this is larger. Under illumination debris on the surface She also tells [place] that she was on her right leg 2 times in the last couple of weeks mostly to reach up for things above her head etc. She felt a sharp pain in the right leg which she thinks is somewhere from the ankle to the knee. The patient has neuropathy and is really uncertain. She cannot feel her foot so she does not think it was coming from there 3/29; 2-week follow-up. Her wound measures smaller. Surface of the wound appears reasonable. She is using silver alginate with underlying Medihoney.  She has home health. X-rays I did of her tib-fib last time were negative although it did show arterial calcification 4/12; 2-week follow-up. Her wound measures smaller in length. Using manuka honey with silver alginate on top. She has home health. 4/26; 2-week follow-up. Her wound is smaller but still very adherent debris under illumination requiring debridement she has been using manuka honey with silver alginate. 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 Been using Hydrofera Blue. She is on suppressive Bactrim but apparently she has been taking it twice daily 6/7; I have not seen this wound and about 6 weeks. Since then she was up in [place]. By her own admission she was walking on the foot because she did not have a wheelchair. The wound is not nearly as healthy looking as it was the last time I saw this. We ordered different things for her but she only uses Medihoney and silver alginate. As far as I know she is on suppressive trimethoprim sulfamethoxazole. She does not admit to any fever or chills. Her CBGs apparently are at baseline however she is saying that she feels some discomfort on the lateral part of her ankle I looked over her last inflammatory markers from the summer 2020 at which time she had a deeply necrotic infected wound in this area. On 11/10/2018 her sedimentation rate was 56 and C-reactive protein 9.9. This was 107 and 29 on 07/29/2018. 6/17; the patient had a necrotic wound the last time she was here on the right dorsal foot. After debridement I did a culture. This showed a very resistant ESBL Klebsiella as well as Enterococcus. Her x-ray of the foot which was done because of warmth and some discomfort showed bone destruction within the carpal bones involving the navicular acute cuboid lateral middle cuneiforms but essentially unchanged from her prior study which was done on  10/29/2018. The findings were felt to represent chronic osteomyelitis. We did inflammatory markers on her. Her white count was 5.25 sedimentation rate 16 and C-reactive protein at 11.1. Notable for the fact that in August 2020 her CRP was 9.9 and sedimentation rate 56. I have looked at her x-rays. It is true that the bone destruction is very impressive however the patient came into this clinic for the wound on her right foot with pieces of bone literally falling out anteriorly with purulent material. I am not exactly sure I could have expected anything different. She has not been systemically unwell no fever chills or blood sugars have been reasonable.  6/28; she arrives with a right heel closed. The substantial area on the right anterior foot looks healthy. Much better looking surface. I think we can change to [facility] seems to help this previously. She is getting her antibiotics at dialysis she should be just about finished 7/9; changed to [facility] last week. Surface wound looks satisfactory not much change in surface area however. She is going to [place] state next week this is usually a difficult thing for this patient follow-up will be for 2 weeks. 7/23; using Hydrofera Blue. She returns from her trip and the wound looks surprisingly good. Usually when this patient goes on trips she comes back with a lot of problems with the wound. She is saying that she sometimes feels an episodic "crunching" feeling on the lateral part of the foot. She is neuropathic and not feeling pain but wonders whether this could be a neuropathic dysesthesia. 11/13/19-Patient returns after 3 weeks, the wound itself is stable and patient states that there is nothing new going on she is on some extra anxiety medications and is resisting the temptation to pick at the dry skin around the wound. 9/20; patient has not been here in over a month and I have not seen her in 2 months. The wound in terms of size I think is  about the same. There is no exposed bone. She has a nonviable surface on this. She is supposed to be using [facility] however she is also been using some form of honey preparation as well as a silver-based dressings. I do not think she has any pattern to this. 10/4; 2-week follow-up. Patient has been using some form of spray which she says has honey and silver to purchase this online she has been covering it with gauze. In spite of this the wound actually looks quite good. The deeper divot distally appears to be close down. There is a rim of epithelialization. 10/18; 2-week follow-up. Patient has been using her Hydrofera Blue covered with her silver honey spray that she got online. 11/1; 2-week follow-up. She is using Hydrofera Blue with a silver honey spray. Wound bed is measuring smaller. She has noticed that her foot is warmer on the right. She is concerned about infection. For a long period of time I had her on prophylactic trimethoprim sulfamethoxazole DS 1 tablet daily. She is asking for this to be restarted. The patient is walking on this foot because of repairs that are being done in a home her but her room is on the second floor she has to go up and down stairs. I have cautioned against this however as usual she will do exactly what she wants to do 11/15; 2-week follow-up. She uses Hydrofera Blue with a silver/honey spray which I have never heard of. I think her wound looks about the same. Some epithelialization. No evidence that this is infected. I think she is walking on this more than we agreed on. She is going on extensive vacation over Thanksgiving 12/3; 2-week follow-up. She is using Hydrofera Blue however over Thanksgiving she ran out of this and she is simply been using Medihoney. In spite of this her wound is smaller almost divided into 2 now. She traveled extensively over Thanksgiving and actually looks quite good in spite of this. Usually this is been a marker of problems for  her 12/17; 2-week follow-up. She is using Hydrofera Blue. The wound is smaller. Debris on the surface of this is fibrinous. She is traveling to [place] over  the holidays which never bodes well for her wounds. I think she is walking more on her feet then she is even willing to admit and she tells me she does walk 2/11; using Hydrofera Blue. Her wounds are contracting however she walks in the clinic with a history that she has not been able to eat she has had vomiting. She also has right eye problems. Her blood sugar was 567. She had blood work done at dialysis and we called there to get her blood work although it still had not returned although they should have this by the end of the day we were told. She also stated that she was not sure what her blood sugar was as her glucose monitor was not working and not coming to tomorrow. She also for some reason does not think her insulin pump is working well. Her endocrinologist is Dr. Elayne Snare 06/03/2020 upon evaluation today patient actually appears to be doing excellent in regard to her wound. In fact she tells me it was not even bleeding until she picked a dry piece of skin off while we were getting ready to come in and see her today. Fortunately there is no signs of active infection which is great news and overall very pleased. She is going to be seeing her neurologist sometime shortly I believe it might even be on Monday. Nonetheless there can proceed with the work-up as far as anything else going on with her eye the fact that her right eyelid is drooping. 3/25; right medial dorsal foot. She has superficial areas here that appear to be fully epithelialized she is using Medihoney and silver alginate and some combination. She has a new area on the mid left foot at roughly the level of the fifth metatarsal base 4/84/8; right medial dorsal foot. Still superficial areas here. I cleaned up 1 of these today she has been using I think mostly Medihoney but I  would like her to use silver alginate. The area on the mid part of her foot also looks improved on the left. Since she was last here she tells me that she noted pus in the area of her left foot. She told dialysis about this they did a culture and ultimately she is on IV antibiotics but were not sure which one. They referred her to [name] he said that he did not need to follow her. I have not really verified any of this and I do not know what antibiotic she is on at dialysis 4/29; patient has been using Medihoney to her wounds. She reports taking off the toenail to her left second toe. She states that home health discharged her and she has not had help with dressing changes. She denies any signs and symptoms of infection. 5/13; 2-week follow-up. Miraculously the wound on the right foot dorsally is healed. She has an area on the tip of her left third toe and an area in the left midfoot. Electronic Signature(s) Signed: 08/12/2020 5:00:10 PM By: Linton Ham MD Entered By: Linton Ham on 08/12/2020 13:40:40 -------------------------------------------------------------------------------- Physical Exam Details Patient Name: Date of Service: [name] 08/12/2020 12:30 PM Medical Record Number: [id number] Patient Account Number: [id number] Date of Birth/Sex: Treating RN: 1970-06-19 (50 y.o. [name] Primary Care Provider: [name], [name] Other Clinician: Referring Provider: Treating Provider/Extender: [name], [name] Weeks in Treatment: 101 Constitutional Patient is hypertensive.. Pulse regular and within target range for patient.Marland Kitchen Respirations regular, non-labored and  within target range.. Temperature is normal and within the target range for the patient.Marland Kitchen Appears in no distress. Notes Wound exam; left plantar foot. There is a wound with hemorrhagic callus. I took off the callus there is indeed an open area here. There is probably some subluxed  bone in the setting of Charcot foot. Nevertheless the wound is small and superficial. No evidence of infection Left third toe has an area on the tip I removed the tip of this necrotic tissue there is no depth to this. I was somewhat concerned about the color around this I did not see anything that was worth culturing Electronic Signature(s) Signed: 08/12/2020 5:00:10 PM By: Linton Ham MD Entered By: Linton Ham on 08/12/2020 13:42:01 -------------------------------------------------------------------------------- Physician Orders Details Patient Name: Date of Service: [name] 08/12/2020 12:30 PM Medical Record Number: [id number] Patient Account Number: [id number] Date of Birth/Sex: Treating RN: September 27, 1970 (50 y.o. [name] Primary Care Provider: [name], [name] Other Clinician: Referring Provider: Treating Provider/Extender: [name], [name] Weeks in Treatment: [phone] Verbal / Phone Orders: No Diagnosis Coding ICD-10 Coding Code Description L97.514 Non-pressure chronic ulcer of other part of right foot with necrosis of bone E10.621 Type 1 diabetes mellitus with foot ulcer L97.521 Non-pressure chronic ulcer of other part of left foot limited to breakdown of skin L97.529 Non-pressure chronic ulcer of other part of left foot with unspecified severity Follow-up Appointments ppointment in 2 weeks. - with Dr. Dellia Nims Return A Bathing/ Shower/ Hygiene May shower and wash wound with soap and water. Edema Control - Lymphedema / SCD / Other Bilateral Lower Extremities Elevate legs to the level of the heart or above for 30 minutes daily and/or when sitting, a frequency of: - throughout the day Avoid standing for long periods of time. Moisturize legs daily. - with dressing changes Off-Loading Open toe surgical shoe to: - to both feet Other: - minimal weight bearing right foot Wound Treatment Wound #50 - Foot Wound Laterality: Plantar,  Left Cleanser: Wound Cleanser Every Other Day/30 Days Discharge Instructions: Cleanse the wound with wound cleanser or normal saline prior to applying a clean dressing using gauze sponges, not tissue or cotton balls. Prim Dressing: PolyMem Silver Non-Adhesive Dressing, 4.25x4.25 in Every Other Day/30 Days ary Discharge Instructions: Apply to wound bed as instructed Secondary Dressing: Woven Gauze Sponge, Non-Sterile 4x4 in Every Other Day/30 Days Discharge Instructions: Apply over primary dressing as directed. Secured With: Elastic Bandage 4 inch (ACE bandage) Every Other Day/30 Days Discharge Instructions: Secure with ACE bandage as directed. Secured With: [employer], 4.5x3.1 (in/yd) Every Other Day/30 Days Discharge Instructions: Secure with Kerlix as directed. Secured With: Paper Tape, 2x10 (in/yd) Every Other Day/30 Days Discharge Instructions: Secure dressing with tape as directed. Wound #51 - T Second oe Wound Laterality: Left Cleanser: Soap and Water Every Other Day/15 Days Discharge Instructions: May shower and wash wound with dial antibacterial soap and water prior to dressing change. Prim Dressing: Promogran Prisma Matrix, 4.34 (sq in) (silver collagen) Every Other Day/15 Days ary Discharge Instructions: Moisten collagen with saline or hydrogel Secondary Dressing: Woven Gauze Sponges 2x2 in Every Other Day/15 Days Discharge Instructions: Apply over primary dressing as directed. Secured With: Elastic Bandage 4 inch (ACE bandage) Every Other Day/15 Days Discharge Instructions: Secure with ACE bandage as directed. Secured With: Child psychotherapist, Sterile 2x75 (in/in) Every Other Day/15 Days Discharge Instructions: Secure with stretch gauze as directed. Secured With: Paper Tape, 1x10 (in/yd)  Every Other Day/15 Days Discharge Instructions: Secure dressing with tape as directed. Electronic Signature(s) Signed: 08/12/2020 5:00:10 PM By: Linton Ham  MD Signed: 08/12/2020 5:32:23 PM By: Lorrin Jackson Previous Signature: 08/12/2020 1:08:46 PM Version By: Lorrin Jackson Entered By: Lorrin Jackson on 08/12/2020 13:24:33 -------------------------------------------------------------------------------- Problem List Details Patient Name: Date of Service: [name] 08/12/2020 12:30 PM Medical Record Number: [id number] Patient Account Number: [id number] Date of Birth/Sex: Treating RN: 1971-01-31 (50 y.o. [name] Primary Care Provider: [name], [name] Other Clinician: Referring Provider: Treating Provider/Extender: [name], [name] Weeks in Treatment: 101 Active Problems ICD-10 Encounter Code Description Active Date MDM Code Description Active Date MDM Diagnosis L97.514 Non-pressure chronic ulcer of other part of right foot with necrosis of bone 09/03/2018 No Yes E10.621 Type 1 diabetes mellitus with foot ulcer 09/24/2018 No Yes L97.521 Non-pressure chronic ulcer of other part of left foot limited to breakdown of 06/24/2020 No Yes skin L97.529 Non-pressure chronic ulcer of other part of left foot with unspecified severity 07/29/2020 No Yes Inactive Problems ICD-10 Code Description Active Date Inactive Date M86.671 Other chronic osteomyelitis, right ankle and foot 09/03/2018 09/03/2018 L97.411 Non-pressure chronic ulcer of right heel and midfoot limited to breakdown of skin 09/17/2019 09/17/2019 L97.521 Non-pressure chronic ulcer of other part of left foot limited to breakdown of skin 04/20/2019 04/20/2019 L97.812 Non-pressure chronic ulcer of other part of right lower leg with fat layer exposed 02/24/2019 02/24/2019 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 08/12/2020 12:30 PM Medical Record Number: [id number] Patient Account Number: [id number] Date of Birth/Sex: Treating RN: [birth year] (50 y.o. [name] Primary Care Provider: [name], [name] Other Clinician: Referring Provider: Treating Provider/Extender: [name], [name] Weeks in Treatment: 101 Subjective History of Present Illness (HPI) 50 year old diabetic who is known to have type 1 diabetes which is poorly controlled last hemoglobin A1c was 11%. She comes in with a ulcerated area on the left lateral foot which has been there for over 6 months. Was recently she has been treated by [name] of podiatry who saw her last on 05/28/2016. Review of his notes revealed that the patient had incision and drainage with placement of antibiotic beads to the left foot on 04/11/2016 for possible osteomyelitis of the cuboid bone. Over the last year she's had a history of amputation of the left fifth toe and a femoropopliteal popliteal bypass graft somewhere in April 2017. 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 His note [name] mentions that the patient has been referred to me for further wound care and possibly great candidate for hyperbaric oxygen therapy due to recurrent osteomyelitis. However we do not have any x-rays of biopsy reports confirming this. He has been on several antibiotics including Bactrim and most recently is on doxycycline for an MRSA. I understand, the patient was not a candidate for IV antibiotics as she has had previous PICC lines which resulted in blood clots in both arms. There was a x-ray report dated 04/04/2016 on [name] notes which showed evidence of fifth ray  resection left foot with  osteolytic changes noted to the fourth metatarsal and cuboid bone on the left. 06/13/2016 -- had a left foot x-ray which showed no acute fracture or dislocation and no definite radiographic evidence of osteomyelitis. Advanced osteopenia was seen. 06/20/2016 -- she has noticed a new wound on the right plantar foot in the region where she had a callus before. 06/27/16- the patient did have her x-ray of the right foot which showed no findings to suggest osteomyelitis. She saw her endocrinologist, [name], yesterday. Her A1c in January was 11. He also indicates mismanagement and noncompliance regarding her diabetes. She is currently on Bactrim for a lip infection. She is complaining of nausea, vomiting and diarrhea. She is unable to articulate the exact orders or dosing of the Bactrim; it is unclear when she will complete this. 07/04/2016 -- results from [facility] of ABIs with ankle waveforms were noted from 02/14/2016. The examination done on 06/27/2015 showed noncompressible ABIs with the right being 1.45 and the left being 1.33. The present examination showed a right ABI of 1.19 on the left of 1.33. The conclusion was that right normal ABI in the lower extremity at rest however compared to previous study which was noncompressible ABI may be falsely elevated side suggesting medial calcification. The left ABI suggested medial calcification. 08/01/2016 -- the patient had more redness and pain on her right foot and did not get to come to see as noted she see her PCP or go to the ER and decided to take some leftover metronidazole which she had at home. As usual, the patient does report she feels and is rather noncompliant. 08/08/2016 -- -- x-ray of the right foot -- FINDINGS:Transmetatarsal amputation is noted. No bony destruction is noted to suggest osteomyelitis. IMPRESSION: No evidence of osteomyelitis. Postsurgical changes are seen. MRI would be more sensitive for possible  bony changes. Culture has grown Serratia Marcescens -- sensitive to Bactrim, ciprofloxacin, ceftazidime she was seen by [name] on 08/06/2016. He did not find any exposed bone, muscle, tendon, ligament or joint. There was no malodor and he did a excisional debridement in the office. ============ Old notes: 50 year old patient who is known to the wound clinic for a while had been away from the wound clinic since 09/01/2014. Over the last several months she has been admitted to various hospitals including [facility] [place]. She was treated for a right metatarsal osteomyelitis with a transmetatarsal amputation and this was done about 2 months ago. He has a small ulcerated area on the right heel and she continues to have an ulcerated area on the left plantar aspect of the foot. The patient was recently admitted to the [facility] hospital group between 7/12 and 10/18/2014. she was given 3 weeks of IV vancomycin and was to follow-up with her surgeons at [facility] and also took oral vancomycin for C. difficile colitis. 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 MRI of the left foot done on 10/14/2014 showed no abscess or osteomyelitis. 04/27/15; this is a patient we know from previous stays in the wound care center. She is a type I diabetic I am not sure of her control currently. Since the last time I saw her she is had a right transmetatarsal amputation and has no wounds on her right foot and has no  open wounds. She is been followed at the wound care center at [facility] in [place]. She comes today with the desire to undergo hyperbaric treatment locally. Apparently one of her wound care providers  in [place] has suggested hyperbarics. This is in response to an MRI from 04/18/15 that showed increased marrow signal and loss of the proximal fifth metatarsal cortex evidence of osteomyelitis with likely early osteomyelitis in the cuboid bone as well. She has a large wound over the base of the fifth metatarsal. She also has a eschar over her the tips of her toes on 1,3 and 5. She does not have peripheral pulses and apparently is going for an angiogram tomorrow which seems reasonable. After this she is going to infectious disease at [facility]. They have been using Medihoney to the large wound on the lateral aspect of the left foot to. The patient has known Charcot deformity from diabetic neuropathy. She also has known diabetic PAD. Surprisingly I can't see that she has had any recent antibiotics, the patient states the last antibiotic she had was at the end of November for 10 days. I think this was in response to culture that showed group G strep although I'm not exactly sure where the culture was from. She is also had arterial studies on 03/29/15. This showed a right ABI of 1.4 that was noncompressible. Her left ABI was 0.73. 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 It was not felt that arterial inflow was adequate for healing of a foot ulcer. Her Doppler waveforms looked monophasic ===== READMISSION 02/28/17; this is in an now 50 year old woman we've had at several different occasions in this clinic. 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 She has a remote ex-smoker. She was last seen in this clinic by Dr. Con Memos I think in May. More recently she is been followed by her podiatrist [name] an infectious disease Dr. Megan Salon. She has 2 open wounds the major one is over the right first metatarsal head she also has a wound on the left plantar foot. an MRI of the right foot on 01/01/17 showed a soft tissue ulcer along the  plantar aspect of the first metatarsal base consistent with osteomyelitis of the first metatarsal stump. Dr. 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 According to the patient this is been open for slightly over a month. She has been on a combination of Cipro 500 twice a day, Zyvox 600 twice a day and Flagyl 500 3 times a day for over a month now as directed by Dr. Megan Salon. cultures of the right foot earlier this year showed MRSA in January and Serratia in May. January also had a few viridans strep. Recent x-rays of both feet were done and [name] office and I don't have these reports. The patient has known PAD and has a history of aleft femoropopliteal bypass in April 2017. 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 Dwyane Dee. In spite of this the last hemoglobin A1c I can see is 10.1 on 01/01/2017. She is being referred by 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 She is been using Medihoney to this area and also an area on  the left midfoot. She is using healing sandals bilaterally. ABIs in this clinic at the left posterior tibial was 1.1 noncompressible on the right READMISSION Non invasive vascular NOVANT 5/18 Aftercare following surgery of the circulatory system Procedure Note - Interface, External Ris In - 08/13/2016 11:05 AM EDT Procedure: Examination consists of physiologic resting arterial pressures of the brachial and ankle arteries bilaterally with continuous wave Doppler waveform analysis. Previous: Previous exam performed on 02/14/16 demonstrated ABIs of Rt = 1.19 and Lt = 1.33. Right: ABI = non-compressible PT 1.47 DP. S/P transmet amputation. , Left: ABI = 1.52, 2nd  digit pressure = 87 mmHg Conclusions: Right: ABI (>1.3) may be falsely elevated, suggesting medial calcification. Left: ABI (>1.3) may be falsely elevated, suggesting medial calcification The patient is a now 50 year old type I diabetic is had multiple issues her graded to chronic diabetic foot ulcers. 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 We had her in the clinic lastin November. At that point she had wounds on her bilateral feet.she had wanted to try hyperbarics however the healogics review process denied her because she hadn't followed up with her vascular surgeon for her left femoropopliteal bypass. The bypass was done by Dr. Raul Del at [facility]. We made her a follow-up with Dr. Raul Del however she did not keep the appointment and therefore she was not approved The patient shows me a small wound on her left fourth metatarsal head on her phone. She developed rapid discoloration in the plantar aspect of the left foot and she was admitted to hospital from 2/2 through 05/10/17 with wet gangrene of the left foot osteomyelitis of the fourth metatarsal heads. She was admitted acutely ill with a temperature of 103. She was started on broad-spectrum vancomycin and cefepime. On 05/06/17 she was taken to the OR by [name] her podiatric surgeon for an incision and drainage irrigation of the left foot wound. Cultures from this surgery revealed group be strep and anaerobes. she was seen by Dr.Xu of orthopedic surgery and scheduled for a below-knee amputation which she u refused. Ultimately she was discharged on Levaquin and Flagyl for one month. MRI 05/05/17 done while she was in the hospital showed abscess adjacent to the fourth metatarsal head and neck small abscess around the fourth flexor tendon. Inflammatory phlegmon and gas in the soft tissues along the lateral aspect of the fourth phalanx. Findings worrisome for  osteomyelitis involving the fourth proximal and middle phalanx and also the third and fourth metatarsals. Finally the patient had actually shortly before this followed up with Dr. Raul Del at no time on 04/29/17. He felt that her left femoropopliteal bypass was patent he felt that her left-sided toe pressures more than adequate for healing a wound on the left foot. This was before her acute presentation. 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 05/28/17; she is started hyperbarics. The patient tells me that for some reason she was not actually on Levaquin but I think on ciprofloxacin. She was on Flagyl. She only started her Levaquin yesterday due to some difficulty with the pharmacy and perhaps her sister picking it up. She has an appointment with [name] tomorrow and with infectious disease early next week. She has no new complaints 06/06/17; the patient continues in hyperbarics. She saw [name] on 05/29/17 who is her podiatric surgeon. He is elected for a transmetatarsal amputation on 06/27/17. I'm not sure at what level he plans to do this amputation. The patient is  unaware ooShe also saw Dr. Megan Salon of infectious disease who elected to continue her on current antibiotics I think this is ciprofloxacin and Flagyl. I'll need to clarify with her tomorrow if she actually has this. We're using silver alginate to the actual wound. Necrotic surface today with material under the flap of her foot. ooOriginal MRI showed abscesses as well as osteomyelitis of the proximal and middle fourth phalanx and the third and fourth metatarsal heads 06/11/17; patient continues in hyperbarics and continues on oral antibiotics. She is doing well. The wound looks better. The necrotic part of this under the flap in her superior foot also looks better. she is been to see [name]. I haven't had a chance to look at his note. Apparently he has put the transmetatarsal amputation on hold her request it is still planning to  take her to the OR for debridement and product application ACEL. I'll see if I can find his note. I'll therefore leave product ordering/requests to [name] for now. I was going to look at Dermagraft 06/18/17-she is here in follow-up evaluation for bilateral foot wounds. She continues with hyperbaric therapy. She states she has been applying manuka honey to the right plantar foot and alternate manuka honey and silver alginate to the left foot, despite our orders. We will continue with same treatment plan and she will follo up next week. 06/25/17; I have reviewed [name] last note from 3/11. She has operative debridement in 2 days' time. By review his note apparently they're going to place there is skin over the majority of this wound which is a good choice. She has a small satellite area at the most proximal part of this wound on the left plantar foot. The area on the right plantar foot we've been using silver alginate and it is close to healing. 07/02/17; unfortunately the patient was not easily approved for [name] proposed surgery. I'm not completely certain what the issue is. She has been using silver alginate to the wound she has completed a first course of hyperbarics. She is still on Levaquin and Flagyl. I have really lost track of the time course here.I suspect she should have another week to 2 of antibiotics. I'll need to see if she is followed up with infectious disease Dr. Megan Salon 07/09/17; the patient is followed up with Dr. Megan Salon. 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 She continues on Levaquin and metronidazole continuing both of these for now I think she is been on fr about 6 weeks. She still has some drainage but no pain. No fever. Her had been plans for her to go to the OR for operative debridement with her podiatrist [name], I am not exactly sure where that is. I'll probably slip a note to [name] today. I note that she follows  with Dr. Dwyane Dee of endocrinology. We have her recertified for hyperbaric oxygen. I have not heard about Dermagraft however I'll see if [name] is planning a skin substitute as well 07/16/17; the patient tells me she is just about out of [place]. I'll need to check Dr. Hale Bogus last notes on this. She states she has plenty of Flagyl however. She comes in today complaining of pain in the right lateral foot which she said lasted for about a day. The wound on the right foot is actually much more medially. She also tells me that the [facility] cost a lot of pain in the left  foot wound and she turned back to silver alginate. Finally Dermagraft has a $510 per application co-pay. She cannot afford this 07/23/17; patient arrives today with the wound not much smaller. There is not much new to add. She has not heard from [name] all try to put in a call to them today. She was asking about Dermagraft again and she has an over $258 per application co-pay she states that she would be willing to try to do a payment plan. I been tried to avoid this. We've been using silver alginate, I'll change to [facility] 07/30/17-She is here in follow-up evaluation for left foot ulcer. She continues hyperbaric medicine. The left foot ulcer is stable we will continue with same treatment plan 08/06/17; she is here for evaluation of her left foot ulcer. Currently being treated for hyperbarics or underlying osteomyelitis. She is completed antibiotics. The left foot ulcer is better smaller with healthier looking granulation. For various reasons I am not really clear on we never got her back to the OR with [name]. He did not respond to my secure text message. Nevertheless I think that surgery on this point is not necessary nor am I completely clear that a skin substitute is necessary The patient is complaining about pain on the outside of her right foot. 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 There is no  erythema. She also states the foot is warm versus her other part of her upper leg and this is largely true. It is not totally clear to me what's causing this. She thinks it's different from her usual neuropathy pain 08/13/17; she arrives in clinic today with a small wound which is superficial on her right first metatarsal head. 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 She tells [place] she was up on her feet over the Mother's Day celebration. ooThe large wound is on the left foot. Continues with hyperbarics for underlying osteomyelitis. We're using Hydrofera Blue. She asked me today about where we were with Dermagraft. I had actually excluded this because of the co-pay however she wants to assume this therefore I'll recheck the co-pay an order for next week. 08/20/17; the patient agreed to accept the co-pay of the first Apligraf which we applied today. She is disappointed she is finishing hyperbarics will run this through the insurance on the extent of the foot infection and the extent of the wound that she had however she is already had 60 dive's. Dermagraft No. 1 08/27/17; Dermagraft No. 2. She is not eligible for any more hyperbaric treatments this month. She reports a fair amount of drainage and she actually changed to the external dressings without disturbing the direct contact layer 09/03/17; the patient arrived in clinic today with the wound superficially looking quite healthy. Nice vibrant red tissue with some advancing epithelialization although not as much adherence of the flap as I might like. However she noted on her own fourth toe some bogginess and she brought that to our attention. Indeed this was boggy feeling like a possibility of subcutaneous fluid. 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 She is not been unwell. We've been using Dermagraft 09/10/17; the culture that I did not last week was MRSA.  She saw Dr. Megan Salon this morning who is going to start her on vancomycin. I had sent him a secure a text message yesterday. I also spoke with her podiatric surgeon Dr.  Evans about surgery on this foot the options for conserving a functional foot etc. Promised me he would see her and will make back consultation today. Paradoxically her actual wound on the plantar aspect of her left foot looks really quite good. I had given her 5 days worth of Baxdella to cover her for MRSA. Her MRI came back showing osteomyelitis within the third metatarsal shaft and head and base of the third and fourth proximal phalanx. She had extensive inflammatory changes throughout the soft tissue of the lateral forefoot. With an ill-defined fluid around the fourth metatarsal extending into the plantar and dorsal soft tissues 09/19/17; the patient is actually on oral Septra and Flagyl. She apparently refused IV vancomycin. She also saw [name] at my request who is planning her for a left BKA sometime in mid July. MRI showed osteomyelitis within the third metatarsal shaft and head and the basis of the third and fourth proximal phalanx. I believe there was felt to be possible septic arthritis involving the third MTP. 09/26/17; the patient went back to Dr. Megan Salon at my suggestion and is now receiving IV daptomycin. Her wound continues to look quite good making the decision to proceed with a transmetatarsal amputation although more difficult for the patient. I believe in my extensive discussions with her she has a good sense of the pros and cons of this. I don't NV the tuft decision she has to make. She has an appointment with [name] I believe in mid July and I previously spoken to him about this issue Has we had used 3 previous Dermagraft. Given the condition of the wound surface I went ahead and added the fourth one today area and I did this not fully realizing that she'll be traveling to [place] next week. I'm hopeful she  can come back in 2 weeks 10/21/17; Her same Dermagraft on for about 3-1/2 weeks. In spite of this the wound arrives looking quite healthy. There is been a lot of healing dimensions are smaller. Looking at the square shaped wound she has now there is some undermining and some depth medially under the undermining although I cannot palpate any bone. No surrounding infection is obvious. She has difficult questions about how to look at this going forward vis--vis amputations versus continued medical therapy. T be truthful the wound is looks so o healthy and it is continued to contract. Hard to justify foot surgery at this point although I still told her that I think it might come to that if we are not able to eradicate the underlying MRSA. She is still highly at risk and she understands this 11/06/17 on evaluation today patient appears to be doing better in regard to her foot ulcer. She's been tolerating the dressing changes without complication. Currently she is here for her Dermagraft #6. Her wound continues to make excellent progress at this point. She does not appear to have any evidence of infection which is good news. 11/13/17 on evaluation today patient appears to be doing excellent at this time. She is here for repeat Dermagraft application. This is #7. Overall her wound seems to be making great progress. 12/05/17; the patient arrives with the wound in much better condition than when I last saw this almost 6 weeks ago. She still has a small probing area in the left metatarsal head region on the lateral aspect of her foot. We applied her last Dermagraft today. ooSince the last time she is here she has what appears to a been a  blood blister on the plantar aspect of left foot although I don't see this is threatening. There is also a thick raised tissue on the right mid metatarsal head region. This was not there I don't think the last time she was here 3 weeks ago. 12/12/17; the patient continues to have  a small programming area in the left metatarsal head region on the lateral aspect of her foot which was the initial large surgical wound. I applied her last Apligraf last week. I'm going to use Endoform starting today ooUnfortunately she has an excoriated area in the left mid foot and the right mid foot. The left midfoot looks like a blistered area this was not opened last week it certainly is open today. Using silver alginate on these areas. She promises me she is offloading this. 12/19/17; the small probing area in the left metatarsal head eyes think is shallower. In general her original wound looks better. We've been using Endoform. The area inferiorly that I think was trauma last week still requires debridement a lot of nonviable surface which I removed. She still has an open open area distally in her foot ooSimilarly on the right foot there is tightly adherent surface debris which I removed. Still areas that don't look completely epithelialized. This is a small open area. We used silver alginate on these areas 12/26/2017; the patient did not have the supplies we ordered from last week including the Endoform. The original large wound on the left lateral foot looks healthy. She still has the undermining area that is largely unchanged from last week. She has the same heavily callused raised edged wounds on the right mid and left midfoot. Both of these requiring debridement. We have been using silver alginate on these areas 01/02/2018; there is still supply issues. We are going to try to use Prisma but I am not sure she actually got it from what she is saying. She has a new open area on the lateral aspect of the left fourth toe [previous fifth ray amputation]. Still the one tunneling area over the fourth metatarsal head. The area is in the midfoot bilaterally still have thick callus around them. She is concerned about a raised swelling on the lateral aspect of the foot. However she is  completely insensate 01/10/2018; we are using Prisma to the wounds on her bilateral feet. Surprisingly the tunneling area over the left fourth metatarsal head that was part of her original surgery has closed down. She has a small open area remaining on the incision line. 2 open areas in the midfoot. 02/10/2018; the patient arrives back in clinic after a month hiatus. She was traveling to visit family in [place]. Is fairly clear she was not offloading the areas on her feet. The original wound over the left lateral foot at the level of metatarsal heads is reopened and probes medially by about a centimeter or 2. She notes that a week ago she had purulent drainage come out of an area on the left midfoot. Paradoxically the worst area is actually on the right foot is extensive with purulent drainage. We will use silver alginate today 02/17/2018; the patient has 3 wounds one over the left lateral foot. She still has a small area over the metatarsal heads which is the remnant of her original surgical wound. This has medial probing depth of roughly 1.4 cm somewhat better than last week. The area on the right foot is larger. We have been using silver alginate to all areas. The area on  the right foot and left foot that we cultured last week showed both Klebsiella and Proteus. Both of these are quinolone sensitive. The patient put her's self on Bactrim and Flagyl that she had left hanging around from prior antibiotic usages. She was apparently on this last week when she arrived. I did not realize this. Unfortunately the Bactrim will not cover either 1 of these organisms. We will send in Cipro 500 twice daily for a week 03/04/2018; the patient has 2 wounds on the left foot one is the original wound which was a surgical wound for a deep DFU. At one point this had exposed bone. She still has an area over the fourth metatarsal head that probes about 1.4 cm although I think this is better than last week. I been using  silver nitrate to try and promote tissue adherence and been using silver alginate here. ooShe also has an area in the left midfoot. This has some depth but a small linear wound. Still requiring debridement. ooOn the right midfoot is a circular wound. A lot of thick callus around this area. ooWe have been using silver alginate to all wound areas ooShe is completed the ciprofloxacin I gave her 2 weeks ago. 03/11/2018; the patient continues to have 2 open areas on the left foot 1 of which was the original surgical wound for a deep DFU. Only a small probing area remains although this is not much different from last week we have been using silver alginate. The other area is on the midfoot this is smaller linear but still with some depth. We have been using silver alginate here as well ooOn the right foot she has a small circular wound in the mid aspect. This is not much smaller than last time. We have been using silver alginate here as well 03/18/2018; she has 3 wounds on the left foot the original surgical wound, a very superficial wound in the mid aspect and then finally the area in the mid plantar foot. She arrives in today with a very concerning area in the wound in the mid plantar foot which is her most proximal wound. There is undermining here of roughly 1-1/2 cm superiorly. Serosanguineous drainage. She tells me she had some pain on for over the weekend that shot up her foot into her thigh and she tells me that she had a nodule in the groin area. ooShe has the single wound in the right foot. ooWe are using endoform to both wound areas 03/24/2018; the patient arrives with the original surgical wound in the area on the left midfoot about the same as last week. There is a collection of fluid under the surface of the skin extending from the surgical wound towards the midfoot although it does not reach the midfoot wound. The area on the right foot is about the same. Cultures from last week of  the left midfoot wound showed abundant Klebsiella abundant Enterococcus faecalis and moderate methicillin resistant staph I gave her Levaquin but this would have only covered the Klebsiella. She will need linezolid 04/01/2018; she is taking linezolid but for the first few days only took 1 a day. I have advised her to finish this at twice daily dosing. In any case all of her wounds are a lot better especially on the left foot. The original surgical wound is closed. The area on the left midfoot considerably smaller. The area on the right foot also smaller. 04/08/2018; her original surgical wound/osteomyelitis on the left foot remains closed. She  has area on the left foot that is in the midfoot area but she had some streaking towards this. This is not connected with her original wound at least not visually. ooSmall wound on the right midfoot appears somewhat smaller. 04/15/18; both wounds looks better. Original wound is better left midfoot. Using silver alginate 1/21; patient states she uses saltwater soak in, stones or remove callus from around her wounds. She is also concerned about a blood blister she had on the left foot but it simply resolved on its own. We've been using silver alginate 1/28; the patient arrives today with the same streaking area from her metatarsals laterally [the site of her original surgical wound] down to the middle of her foot. There is some drainage in the subcutaneous area here. This concerns me that there is actually continued ongoing infection in the metatarsals probably the fourth and third. This fixates an MRI of the foot without contrast [chronic renal failure] ooThe wound in the mid part of the foot is small but I wonder whether this area actually connects with the more distal foot. ooThe area on the right midfoot is probably about the same. Callus thick skin around the small wound which I removed with a curette we have been using silver alginate on both wound  areas 2/4; culture I did of the draining site on the left foot last time grew methicillin sensitive staph aureus. MRI of the left foot showed interval resolution of the findings surrounding the third metatarsal joint on the prior study consistent with treated osteomyelitis. Chronic soft tissue ulceration in the plantar and lateral aspect of the forefoot without residual focal fluid collection. No evidence of recurrent osteomyelitis. 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 She tells me she had nausea and vomiting over the weekend and missed 2 days. In general her foot looks much the same. She has a small open area just below the left fourth metatarsal head. A linear area in the left midfoot. Some discoloration extending from the inferior part of this into the left lateral foot although this appears to be superficial. She has a small area on the right midfoot which generally looks smaller after debridement 2/18; the patient is completing his cephalexin and has another 2 days. She continues to have open areas on the left and right foot. 2/25; she is now off antibiotics. The area on the left foot at the site of her original surgical wound has closed yet again. She still has open areas in the mid part of her foot however these appear smaller. The area on the right mid foot looks about the same. We have been using silver alginate She tells me she had a serious hypoglycemic spell at home. She had to have EMS called and get IV dextrose 3/3; disappointing on the left lateral foot large area of necrotic tissue surrounding the linear area. This appears to track up towards the same original surgical wound. Required extensive debridement. The area on the right plantar  foot is not a lot better also using silver 3/12; the culture I did last time showed abundant enterococcus. I have prescribed Augmentin, should cover any unrecognized anaerobes as well. In addition there were a few MRSA and Serratia that would not be well covered although  I did not want to give her multiple antibiotics. She comes in today with a new wound in the right midfoot this is not connected with the original wound over her MTP a lot of thick callus tissue around both wounds but once again she said she is not walking on these areas 3/17-Patient comes in for follow-up on the bilateral plantar wounds, the right midfoot and the left plantar wound. Both these are heavily callused surrounding the wounds. We are continuing to use silver alginate, she is compliant with offloading and states she uses a wheelchair fairly often at home 3/24; both wound areas have thick callus. However things actually look quite a bit better here for the majority of her left foot and the right foot. 3/31; patient continues to have thick callused somewhat irritated looking tissue around the wounds which individually are fairly superficial. There is no evidence of surrounding infection. We have been using silver alginate however I change that to [facility] today 4/17; patient returns to clinic after having a scare with Covid she tested negative in her primary doctor's office. She has been using Hydrofera Blue. She does not have an open area on the right foot. On the left foot she has a small open area with the mid area not completely viable. She showed me pictures of what looks like a hemorrhagic blister from several days ago but that seems to have healed over this was on the lateral left foot 4/21; patient comes in to clinic with both her wounds on her feet closed. However over the weekend she started having pain in her right foot and leg up into the thigh. She felt as though she was running a low-grade fever but did not  take her temperature. She took a doxycycline that she had leftover and yesterday a single Septra and metronidazole. She thinks things feel somewhat better. 4/28; duplex ultrasound I ordered last week was negative for DVT or superficial thrombophlebitis. She is completed the doxycycline I gave her. States she is still having a lot of pain in the right calf and right ankle which is no better than last week. She cannot sleep. She also states she has a temperature of up to 101, coughing and complaining of visual loss in her bilateral eyes. Apparently she was tested for Covid 2 weeks ago at [facility] and that was negative. Readmission: 09/03/18 patient presents back for reevaluation after having been evaluated at the end of April regarding erythema and swelling of her right lower extremity. Subsequently she ended up going to the hospital on 07/29/18 and was admitted not to be discharged until 08/08/18. Unfortunately it was noted during the time that she was in the hospital that she did have methicillin-resistant Staphylococcus aureus as the infection noted at the site. It was also determined that she did have osteomyelitis which appears to be fairly significant. She was treated with vancomycin and in fact is still on IV vancomycin at dialysis currently. This is actually slated to continue until 09/12/18 at least which will be the completion of the six weeks of therapy. Nonetheless based on what I'm seeing at this point I'm not sure she will be anywhere near ready to discontinue antibiotics at that time. Since she was released from the hospital she was seen by [name] who is her podiatrist on 08/27/18. His note specifically states that he is recommended that the patient needs of one knee amputation on the right as she has a life- threatening situation that can lead quickly  to sepsis. The patient advised she would like to try to save her leg to which [name] apparently told her that this was against all medical  advice. She also want to discontinue the Wound VAC which had been initiated due to the fact that she wasn't pleased with how the wound was looking and subsequently she wanted to pursue applying Medihoney at that time. He stated that he did not believe that the right lower extremity was salvageable and that the patient understood but would still like to attempt hyperbaric option therapy if it could be of any benefit. She was therefore referred back to [place] for further evaluation. He plans to see her back next week. Upon inspection today patient has a significant amount purulent drainage noted from the wound at this point. The bone in the distal portion of her foot also appears to be extremely necrotic and spongy. When I push down on the bone it bubbles and seeps purulent drainage from deeper in the end of the foot. I do not think that this is likely going to heal very well at all and less aggressive surgical debridement were undertaken more than what I believe we can likely do here in our office. 09/12/2018; I have not seen this patient since the most recent hospitalization although she was in our clinic last week. I have reviewed some of her records from a complex hospitalization. 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 There is situation was complicated by MRSA bacteremia and acute on chronic renal failure now on dialysis. She is receiving vancomycin at dialysis. We started her on Dakin's wet-to-dry last week she is changing this daily. There is still purulent drainage coming out of her foot. Although she is apparently "agreeable" to a below-knee amputation which is been suggested by multiple clinicians she wants this to be done in [place]. She apparently has a telehealth visit with that provider sometime in late [place] 6/24. I have told her I think this is probably too long. Nevertheless I could not convince her to allow a local doctor to perform  BKA. 09/19/2018; the patient has a large necrotic area on the right anterior foot. She has had previous transmetatarsal amputations. Culture I did last week showed MRSA nothing else she is on vancomycin at dialysis. She has continued leaking purulent drainage out of the distal part of the large circular wound on the right anterior foot. She apparently went to see Dr. 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 Somehow that translated into her being referred to plastic surgery for debridement of the area. I gather she basically refused amputation although I do not have a copy of Dr. Berenice Primas notes. The patient really wants to have a trial of hyperbaric oxygen. I agreed with initial assessment in this clinic that this was probably too far along to benefit however if she is going to have plastic surgery I think she would benefit from ancillary hyperbaric oxygen. The issue here is that the patient has benefited as maximally as any patient I have ever seen from hyperbaric oxygen therapy. Most recently she had exposed bone on the lateral part of her left foot after a surgical procedure and that actually has closed. She has eschared areas in both heels but no open area. She is remained systemically well. I am not optimistic that anything can be done about this but the patient is very clear that she wants an attempt. The attempt would  include a wound VAC further debridements and hyperbaric oxygen along with IV antibiotics. 6/26; I put her in for a trial of hyperbaric oxygen only because of the dramatic response she has had with wounds on her left midfoot earlier this year which was a surgical wound that went straight to her bone over the metatarsal heads and also remotely the left third toe. We will see if we can get this through our review process and insurance. She arrives in clinic with again purulent material pouring out of necrotic bone on the top of the foot distally. There is  also some concerning erythema on the front of the leg that we marked. It is bit difficult to tell how tender this is because of neuropathy. I note from infectious disease that she had her vancomycin extended. 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 She had the wound VAC on for part of the week. The rest of the time she is putting various things on this including Medihoney, "ionized water" silver sorb gel etc. 7/7; follow-up along with HBO. She is still on vancomycin at dialysis. She has a large open area on the dorsal right foot and a small dark eschar area on her heel. There is a lot less erythema in the area and a lot less tenderness. From an infection point of view I think this is better. She still has a lot of necrosis in the remaining right forefoot [previous TMA] we are still using the wound VAC in this area 7/16; follow-up along with HBO. I put her on linezolid after she finished her vancomycin. We started this last Friday I gave her 2 weeks worth. I had the expectation that she would be operatively debrided by Dr. Marla Roe but that still has not happened yet. Patient phoned the office this week. She arrives for review today after HBO. The distal part of this wound is completely necrotic. Nonviable pieces of tendon bone was still purulent drainage. Also concerning that she has black eschar over the heel that is expanding. I think this may be indicative of infection in this area as well. She has less erythema and warmth in the ankle and calf but still an abnormal exam 7/21 follow-up along with HBO. I will renew her linezolid after checking a CBC with differential monitoring her blood counts especially her platelets. She was supposed to have surgery yesterday but if I am reading things correctly this was canceled after her blood sugar was found to be over 500. I thought Dr. Marla Roe who called me said that they were sending her to the ER but the patient states  that was not the case. 7/28. Follow-up along with HBO. She is on linezolid I still do not have any lab work from dialysis even though I called last week. The patient is concerned about an area on her left lateral foot about the level of the base of her fifth metatarsal. I did not really see anything that ominous here however this patient is in [place] ability to point out problems that she is sensing and she has been accurate in the past Finally she received a call from Dr. Marla Roe who is referring her to another orthopedic surgeon stating that she is too booked up to take her to the operating room now. Was still using a wound VAC on the foot 8/3 -Follow-up after HBO, she is got another week of linezolid, she is to call ID for an appointment, x-rays of both feet were  reviewed, the left foot x-ray with third MTP joint osteo- Right foot x-ray widespread osteo-in the right midfoot Right ankle x-ray does not show any active evidence of infection 8/11-Patient is seen after HBO, the wounds on the right foot appear to be about the same, the heel wound had some necrotic base over tendon that was debrided with a curette 8/21; patient is seen after HBO. The patient's wound on her dorsal foot actually looks reasonably good and there is substantial amount of epithelialization however the open area distally still has a lot of necrotic debris partially bone. I cannot really get a good sense of just how deep this probes under the foot. She has been pressuring me this week to order medical maggots through a company in [place] for her. The problem I have is there is not a defined wound area here. On the positive side there is no purulence. She has been to see infectious disease she is still on Septra DS although I have not had a chance to review their notes 8/28; patient is seen in conjunction with HBO. The wounds on her foot continued to improve including the right dorsal foot substantially the, the distal  part of this wound and the area on the right heel. We have been using a wound VAC over this chronically. She is still on trimethoprim as directed by infectious disease 9/4; patient is seen in conjunction with HBO. Right dorsal foot wound substantially anteriorly is better however she continues to have a deep wound in the distal part of this that is not responding. We have been using silver collagen under border foam ooArea on the right plantar medial heel seems better. We have been using Hydrofera Blue 12/12/18 on evaluation today patient appears to be doing about the same with regard to her wound based on prior measurements. She does have some necrotic tissue noted on the lateral aspect of the wound that is going require a little bit of sharp debridement today. This includes what appears to be potentially either severely necrotic bone or tendon. Nonetheless other than that she does not appear to have any severe infection which is good news 9/18; it is been 2 weeks since I saw this wound. She is tolerating HBO well. Continued dramatic improvement in the area on the right dorsal foot. She still has a small wound on the heel that we have been using Hydrofera Blue. She continues with a wound VAC 9/24; patient has to be seen emergently today with a swelling on her right lateral lower leg. She says that she told Dr. Evette Doffing about this and also myself on a couple of occasions but I really have no recollection of this. She is not systemically unwell and her wound really looked good the last time I saw this. She showed this to providers at dialysis and she was able to verify that she was started on cephalexin today for 5 doses at dialysis. She dialyzes on Tuesday Thursday and Saturday. 10/2; patient is seen in conjunction with HBO. The area that is draining on the right anterior medial tibia is more extensive. Copious amounts of serosanguineous drainage with some purulence. We are still using the wound VAC on  the original wound then it is stable. Culture I did of the original IandD showed MRSA I contacted dialysis she is now on vancomycin with dialysis treatments. I asked them to run a month 10/9; patient seen in conjunction with HBO. She had a new spontaneous open area just above the wound  on the right medial tibia ankle. More swelling on the right medial tibia. Her wound on the foot looks about the same perhaps slightly better. There is no warmth spreading up her leg but no obvious erythema. her MRI of the foot and ankle and distal tib-fib is not booked for next Friday I discussed this with her in great detail over multiple days. it is likely she has spreading infection upper leg at least involving the distal 25% above the ankle. She knows that if I refer her to orthopedics for infectious disease they are going to recommend amputation and indeed I am not against this myself. We had a good trial at trying to heal the foot which is what she wanted along with antibiotics debridement and HBO however she clearly has spreading infection [probably staph aureus/MRSA]. Nevertheless she once again tells me she wants to wait the left of the MRI. She still makes comments about having her amputation done in [place]. 10/19; arrives today with significant swelling on the lateral right leg. Last culture I did showed Klebsiella. Multidrug-resistant. Cipro was intermediate sensitivity and that is what I have her on pending her MRI which apparently is going to be done on Thursday this week although this seems to be moving back and forth. She is not systemically unwell. We are using silver alginate on her major wound area on the right medial foot and the draining areas on the right lateral lower leg 10/26; MRI showed extensive abscess in the anterior compartment of the right leg also widespread osteomyelitis involving osseous structures of the midfoot and portions of the hindfoot. Also suspicion for osteomyelitis  anterior aspect of the distal medial malleolus. Culture I did of the purulence once again showed a multidrug-resistant Klebsiella. I have been in contact with nephrology late last week and she has been started on cefepime at dialysis to replace the vancomycin We sent a copy of her MRI report to Dr. Geroge Baseman in [place] who is an orthopedic surgeon. The patient takes great stock in his opinion on this. She says she will go to [place] to have her leg amputated if Dr. Geroge Baseman does not feel there is any salvage options. 11/2; she still is not talk to her orthopedic surgeon in [place]. Apparently he will call her at [phone] this afternoon. The quality of this is she has not allowed me to refer her anywhere. She has been told over and over that she needs this amputated but has not agreed to be referred. She tells me her blood sugar was 600 last night but she has not been febrile. 11/9; she never did got a call from the orthopedic surgeon in [place] therefore that is off the radar. We have arranged to get her see orthopedic surgery at [facility]. She still has a lot of draining purulence coming out of the new abscess in her right leg although that probably came from the osteomyelitis in her right foot and heel. Meanwhile the original wound on the right foot looks very healthy. Continued improvement. The issue is that the last MRI showed osteomyelitis in her right foot extensively she now has an abscess in the right anterior lower leg. There is nobody in [place] who will offer this woman anything but an amputation and to be honest that is probably what she needs. I think she still wants to talk about limb salvage although at this point I just do not see that. She has completed her vancomycin at dialysis which was for the original staph aureus  she is still on cefepime for the more recent Klebsiella. She has had a long course of both of these antibiotics which should have benefited the osteomyelitis on  the right foot as well as the abscess. 11/16; apparently [place] elective surgery is shut down because of COVID-19 pandemic. I have reached out to some contacts at [facility] to see if we can get her an orthopedic appointment there. I am concerned about continually leaving this but for the moment everything is static. In fact her original large wound on this foot is closing down. It is the abscess on the right anterior leg that continues to drain purulent serosanguineous material. She is not currently on any antibiotics however she had a prolonged course of vancomycin [1 month] as well as cefepime for a month 02/24/2019 on evaluation today patient appears to be doing better than the last time I saw her. This is not a patient that I typically see. With that being said I am covering for Dr. Dellia Nims this week and again compared to when I last saw her overall the wounds in particular seem to be doing significantly better which is good news. With that being said the patient tells me several disconcerting things. She has not been able to get in to see anyone for potential debridement in regard to her leg wounds although she tells me that she does not think it is necessary any longer because she is taking care of that herself. She noticed a string coming out of the lower wound on her leg over the last week. The patient states that she subsequently decided that we must of pack something in there and started pulling the string out and as it kept coming and coming she realized this was likely her tendon. With that being said she continued to remove as much of this as she could. She then I subsequently proceeded to using tubes of antibiotic ointment which she will stick down into the wound and then scored as much as she can until she sees it coming out of the other wound opening. She states that in doing this she is actually made things better and there is less redness and irritation. With regard to her foot  wound she does have some necrotic tendon and tissue noted in one small corner but again the actual wound itself seems to be doing better with good granulation in general compared to my last evaluation. 12/7; continued improvement in the wound on the substantial part of the right medial foot. Still a necrotic area inferiorly that required debridement but the rest of this looks very healthy and is contracting. She has 2 wounds on the right lateral leg which were her original drainage sites from her abscess but all of this looks a lot better as well. She has been using silver alginate after putting antibiotic biotic ointment in one wound and watching it come out the other. I have talked to her in some detail today. I had given her names of orthopedic surgeons at [facility] for second opinion on what to do about the right leg. I do not think the patient never called them. She has not been able to get a hold of the orthopedic surgeon in [place] that she had put a lot of faith in as being somebody would give her an opinion that she would trust. I talked to her today and said even if I could get her in to another orthopedic surgeon about the leg which she accept an amputation and  she said she would not therefore I am not going to press this issue for the moment 12/14; continued improvement in his substantial wound on the right medial foot. There is still a necrotic area inferiorly with tightly adherent necrotic debris which I have been working on debriding each time she is here. She does not have an orthopedic appointment. Since last time she was here I looked over her cultures which were essentially MRSA on the foot wound and gram-negative rods in the abscess on the anterior leg. 12/21; continued improvement in the area on the right medial foot. She is not up on this much and that is probably a good thing since I do not know it could support continuous ambulation. She has a small area on the right lateral  leg which were remanence of the IandD's I did because of the abscess. I think she should probably have prophylactic antibiotics I am going to have to look this over to see if we can make an intelligent decision here. In the meantime her major wound is come down nicely. Necrotic area inferiorly is still there but looks a lot better 04/06/2019; she has had some improvement in the overall surface area on the right medial foot somewhat narrowedr both but somewhat longer. The areas on the right lateral leg which were initial IandD sites are superficial. Nothing is present on the right heel. We are using silver alginate to the wound areas 1/18; right medial foot somewhat smaller. Still a deep probing area in the most distal recess of the wound. She has nothing open on the right leg. She has a new wound on the plantar aspect of her left fourth toe which may have come from just pulling skin. The patient using Medihoney on the wound on her foot under silver alginate. I cannot discourage her from this 2/1; 2-week follow-up using silver alginate on the right foot and her left fourth toe. The area on the right dorsal foot is contracted although there is still the deep area in the most distal part of the wound but still has some probing depth. No overt infection 2/15; 2-week follow-up. She continues to have improvement in the surface area on the dorsal right foot. Even the tunneling area from last time is almost closed. The area that was on the plantar part of her left fourth toe over the PIP is indeed closed 3/1; 2-week follow-up. Continued improvement in surface area. The original divot that we have been debriding inferiorly I think has full epithelialization although the epithelialization is gone down into the wound with probably 4 mm of depth. Even under intense illumination I am unable to see anything open here. The remanence of the wound in this area actually look quite healthy. We have been using silver  alginate 3/15; 2-week follow-up. Unfortunately not as good today. She has a comma shaped wound on the dorsal foot however the upper part of this is larger. Under illumination debris on the surface She also tells [place] that she was on her right leg 2 times in the last couple of weeks mostly to reach up for things above her head etc. She felt a sharp pain in the right leg which she thinks is somewhere from the ankle to the knee. The patient has neuropathy and is really uncertain. She cannot feel her foot so she does not think it was coming from there 3/29; 2-week follow-up. Her wound measures smaller. Surface of the wound appears reasonable. She is using silver alginate with  underlying Medihoney. She has home health. X-rays I did of her tib-fib last time were negative although it did show arterial calcification 4/12; 2-week follow-up. Her wound measures smaller in length. Using manuka honey with silver alginate on top. She has home health. 4/26; 2-week follow-up. Her wound is smaller but still very adherent debris under illumination requiring debridement she has been using manuka honey with silver alginate. 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 Been using Hydrofera Blue. She is on suppressive Bactrim but apparently she has been taking it twice daily 6/7; I have not seen this wound and about 6 weeks. Since then she was up in [place]. By her own admission she was walking on the foot because she did not have a wheelchair. The wound is not nearly as healthy looking as it was the last time I saw this. We ordered different things for her but she only uses Medihoney and silver alginate. As far as I know she is on suppressive trimethoprim sulfamethoxazole. She does not admit to any fever or chills. Her CBGs apparently are at baseline however she is saying that she feels some discomfort on the lateral  part of her ankle I looked over her last inflammatory markers from the summer 2020 at which time she had a deeply necrotic infected wound in this area. On 11/10/2018 her sedimentation rate was 56 and C-reactive protein 9.9. This was 107 and 29 on 07/29/2018. 6/17; the patient had a necrotic wound the last time she was here on the right dorsal foot. After debridement I did a culture. This showed a very resistant ESBL Klebsiella as well as Enterococcus. Her x-ray of the foot which was done because of warmth and some discomfort showed bone destruction within the carpal bones involving the navicular acute cuboid lateral middle cuneiforms but essentially unchanged from her prior study which was done on 10/29/2018. The findings were felt to represent chronic osteomyelitis. We did inflammatory markers on her. Her white count was 5.25 sedimentation rate 16 and C-reactive protein at 11.1. Notable for the fact that in August 2020 her CRP was 9.9 and sedimentation rate 56. I have looked at her x-rays. It is true that the bone destruction is very impressive however the patient came into this clinic for the wound on her right foot with pieces of bone literally falling out anteriorly with purulent material. I am not exactly sure I could have expected anything different. She has not been systemically unwell no fever chills or blood sugars have been reasonable. 6/28; she arrives with a right heel closed. The substantial area on the right anterior foot looks healthy. Much better looking surface. I think we can change to [facility] seems to help this previously. She is getting her antibiotics at dialysis she should be just about finished 7/9; changed to [facility] last week. Surface wound looks satisfactory not much change in surface area however. She is going to [place] state next week this is usually a difficult thing for this patient follow-up will be for 2 weeks. 7/23; using Hydrofera Blue. She returns from  her trip and the wound looks surprisingly good. Usually when this patient goes on trips she comes back with a lot of problems with the wound. She is saying that she sometimes feels an episodic "crunching" feeling on the lateral part of the foot. She is neuropathic and not feeling pain but  wonders whether this could be a neuropathic dysesthesia. 11/13/19-Patient returns after 3 weeks, the wound itself is stable and patient states that there is nothing new going on she is on some extra anxiety medications and is resisting the temptation to pick at the dry skin around the wound. 9/20; patient has not been here in over a month and I have not seen her in 2 months. The wound in terms of size I think is about the same. There is no exposed bone. She has a nonviable surface on this. She is supposed to be using [facility] however she is also been using some form of honey preparation as well as a silver-based dressings. I do not think she has any pattern to this. 10/4; 2-week follow-up. Patient has been using some form of spray which she says has honey and silver to purchase this online she has been covering it with gauze. In spite of this the wound actually looks quite good. The deeper divot distally appears to be close down. There is a rim of epithelialization. 10/18; 2-week follow-up. Patient has been using her Hydrofera Blue covered with her silver honey spray that she got online. 11/1; 2-week follow-up. She is using Hydrofera Blue with a silver honey spray. Wound bed is measuring smaller. She has noticed that her foot is warmer on the right. She is concerned about infection. For a long period of time I had her on prophylactic trimethoprim sulfamethoxazole DS 1 tablet daily. She is asking for this to be restarted. The patient is walking on this foot because of repairs that are being done in a home her but her room is on the second floor she has to go up and down stairs. I have cautioned against this  however as usual she will do exactly what she wants to do 11/15; 2-week follow-up. She uses Hydrofera Blue with a silver/honey spray which I have never heard of. I think her wound looks about the same. Some epithelialization. No evidence that this is infected. I think she is walking on this more than we agreed on. She is going on extensive vacation over Thanksgiving 12/3; 2-week follow-up. She is using Hydrofera Blue however over Thanksgiving she ran out of this and she is simply been using Medihoney. In spite of this her wound is smaller almost divided into 2 now. She traveled extensively over Thanksgiving and actually looks quite good in spite of this. Usually this is been a marker of problems for her 12/17; 2-week follow-up. She is using Hydrofera Blue. The wound is smaller. Debris on the surface of this is fibrinous. She is traveling to [place] over the holidays which never bodes well for her wounds. I think she is walking more on her feet then she is even willing to admit and she tells me she does walk 2/11; using Hydrofera Blue. Her wounds are contracting however she walks in the clinic with a history that she has not been able to eat she has had vomiting. She also has right eye problems. Her blood sugar was 567. She had blood work done at dialysis and we called there to get her blood work although it still had not returned although they should have this by the end of the day we were told. She also stated that she was not sure what her blood sugar was as her glucose monitor was not working and not coming to tomorrow. She also for some reason does not think her insulin pump is working well. Her endocrinologist  is Dr. Elayne Snare 06/03/2020 upon evaluation today patient actually appears to be doing excellent in regard to her wound. In fact she tells me it was not even bleeding until she picked a dry piece of skin off while we were getting ready to come in and see her today. Fortunately there is no  signs of active infection which is great news and overall very pleased. She is going to be seeing her neurologist sometime shortly I believe it might even be on Monday. Nonetheless there can proceed with the work-up as far as anything else going on with her eye the fact that her right eyelid is drooping. 3/25; right medial dorsal foot. She has superficial areas here that appear to be fully epithelialized she is using Medihoney and silver alginate and some combination. ooShe has a new area on the mid left foot at roughly the level of the fifth metatarsal base 4/84/8; right medial dorsal foot. Still superficial areas here. I cleaned up 1 of these today she has been using I think mostly Medihoney but I would like her to use silver alginate. The area on the mid part of her foot also looks improved on the left. Since she was last here she tells me that she noted pus in the area of her left foot. She told dialysis about this they did a culture and ultimately she is on IV antibiotics but were not sure which one. They referred her to [name] he said that he did not need to follow her. I have not really verified any of this and I do not know what antibiotic she is on at dialysis 4/29; patient has been using Medihoney to her wounds. She reports taking off the toenail to her left second toe. She states that home health discharged her and she has not had help with dressing changes. She denies any signs and symptoms of infection. 5/13; 2-week follow-up. Miraculously the wound on the right foot dorsally is healed. She has an area on the tip of her left third toe and an area in the left midfoot. Objective Constitutional Patient is hypertensive.. Pulse regular and within target range for patient.Marland Kitchen Respirations regular, non-labored and within target range.. Temperature is normal and within the target range for the patient.Marland Kitchen Appears in no distress. Vitals Time Taken: 12:51 PM, Height: 67 in, Weight: 125 lbs,  BMI: 19.6, Temperature: 98.7 F, Pulse: 80 bpm, Respiratory Rate: 17 breaths/min, Blood Pressure: 172/74 mmHg, Capillary Blood Glucose: 240 mg/dl. General Notes: Wound exam; left plantar foot. There is a wound with hemorrhagic callus. I took off the callus there is indeed an open area here. There is probably some subluxed bone in the setting of Charcot foot. Nevertheless the wound is small and superficial. No evidence of infection oo Left third toe has an area on the tip I removed the tip of this necrotic tissue there is no depth to this. I was somewhat concerned about the color around this I did not see anything that was worth culturing Integumentary (Hair, Skin) Wound #43 status is Healed - Epithelialized. Original cause of wound was Gradually Appeared. The date acquired was: 08/04/2018. The wound has been in treatment 101 weeks. The wound is located on the Right,Medial Foot. The wound measures 0cm length x 0cm width x 0cm depth; 0cm^2 area and 0cm^3 volume. Wound #50 status is Open. Original cause of wound was Skin T ear/Laceration. The date acquired was: 06/24/2020. The wound has been in treatment 7 weeks. The wound is located  on the [place]. The wound measures 0.4cm length x 0.3cm width x 0.1cm depth; 0.094cm^2 area and 0.009cm^3 volume. There is Fat Layer (Subcutaneous Tissue) exposed. There is no tunneling or undermining noted. There is a medium amount of serosanguineous drainage noted. The wound margin is well defined and not attached to the wound base. There is no granulation within the wound bed. There is a large (67-100%) amount of necrotic tissue within the wound bed including Eschar. Wound #51 status is Open. Original cause of wound was Trauma. The date acquired was: 07/29/2020. The wound has been in treatment 2 weeks. The wound is located on the Left T Second. The wound measures 1cm length x 1cm width x 0.1cm depth; 0.785cm^2 area and 0.079cm^3 volume. There is Fat  Layer oe (Subcutaneous Tissue) exposed. There is no tunneling or undermining noted. There is a medium amount of serosanguineous drainage noted. The wound margin is indistinct and nonvisible. There is large (67-100%) pink granulation within the wound bed. There is a small (1-33%) amount of necrotic tissue within the wound bed including Adherent Slough. 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 There was a Excisional Skin/Subcutaneous Tissue Debridement with a total area of 0.12 sq cm performed by Ricard Dillon., MD. With the following instrument(s): Curette to remove Non-Viable tissue/material. Material removed includes Eschar and Subcutaneous Tissue and after achieving pain control using Other (Benzocaine). No specimens were taken. A time out was conducted at 13:10, prior to the start of the procedure. A Minimum amount of bleeding was controlled with Pressure. The procedure was tolerated well. Post Debridement Measurements: 0.4cm length x 0.3cm width x 0.1cm depth; 0.009cm^3 volume. Character of Wound/Ulcer Post Debridement is stable. Severity of Tissue Post Debridement is: Fat layer exposed. Post procedure Diagnosis Wound #50: Same as Pre-Procedure Wound #51 Pre-procedure diagnosis of Wound #51 is a Diabetic Wound/Ulcer of the Lower Extremity located on the Left T Second .Severity of Tissue Pre Debridement oe is: Fat layer exposed. There was a Excisional Skin/Subcutaneous Tissue Debridement with a total area of 1 sq cm  performed by Ricard Dillon., MD. With the following instrument(s): Curette Material removed includes Subcutaneous Tissue after achieving pain control using Other (Benzocaine). No specimens were taken. A time out was conducted at 13:10, prior to the start of the procedure. A Minimum amount of bleeding was controlled with Pressure. The procedure was tolerated well. Post Debridement Measurements: 1cm length x 1cm width x 0.1cm depth; 0.079cm^3 volume. Character of Wound/Ulcer Post Debridement is stable. Severity of Tissue Post Debridement is: Fat layer exposed. Post procedure Diagnosis Wound #51: Same as Pre-Procedure Plan Follow-up Appointments: Return Appointment in 2 weeks. - with Dr. Dellia Nims Bathing/ Shower/ Hygiene: May shower and wash wound with soap and water. Edema Control - Lymphedema / SCD / Other: Elevate legs to the level of the heart or above for 30 minutes daily and/or when sitting, a frequency of: - throughout the day Avoid standing for long periods of time. Moisturize legs daily. - with dressing changes Off-Loading: Open toe surgical shoe to: - to both feet Other: -  minimal weight bearing right foot WOUND #50: - Foot Wound Laterality: Plantar, Left Cleanser: Wound Cleanser Every Other Day/30 Days Discharge Instructions: Cleanse the wound with wound cleanser or normal saline prior to applying a clean dressing using gauze sponges, not tissue or cotton balls. Prim Dressing: PolyMem Silver Non-Adhesive Dressing, 4.25x4.25 in Every Other Day/30 Days ary Discharge Instructions: Apply to wound bed as instructed Secondary Dressing: Woven Gauze Sponge, Non-Sterile 4x4 in Every Other Day/30 Days Discharge Instructions: Apply over primary dressing as directed. Secured With: Elastic Bandage 4 inch (ACE bandage) Every Other Day/30 Days Discharge Instructions: Secure with ACE bandage as directed. Secured With: [employer], 4.5x3.1 (in/yd) Every Other Day/30 Days Discharge  Instructions: Secure with Kerlix as directed. Secured With: Paper T ape, 2x10 (in/yd) Every Other Day/30 Days Discharge Instructions: Secure dressing with tape as directed. WOUND #51: - T Second Wound Laterality: Left oe Cleanser: Soap and Water Every Other Day/15 Days Discharge Instructions: May shower and wash wound with dial antibacterial soap and water prior to dressing change. Prim Dressing: Promogran Prisma Matrix, 4.34 (sq in) (silver collagen) Every Other Day/15 Days ary Discharge Instructions: Moisten collagen with saline or hydrogel Secondary Dressing: Woven Gauze Sponges 2x2 in Every Other Day/15 Days Discharge Instructions: Apply over primary dressing as directed. Secured With: Elastic Bandage 4 inch (ACE bandage) Every Other Day/15 Days Discharge Instructions: Secure with ACE bandage as directed. Secured With: Child psychotherapist, Sterile 2x75 (in/in) Every Other Day/15 Days Discharge Instructions: Secure with stretch gauze as directed. Secured With: Paper T ape, 1x10 (in/yd) Every Other Day/15 Days Discharge Instructions: Secure dressing with tape as directed. 1. Using silver collagen to the left third toe 2. Polymen Ag to her foot 3. Urged to continue to offload this as much as possible 4. There is nothing on the right dorsal foot Electronic Signature(s) Signed: 08/12/2020 5:00:10 PM By: Linton Ham MD Entered By: Linton Ham on 08/12/2020 13:42:48 -------------------------------------------------------------------------------- SuperBill Details Patient Name: Date of Service: [name] 08/12/2020 Medical Record Number: [id number] Patient Account Number: [id number] Date of Birth/Sex: Treating RN: 1970-07-28 (50 y.o. 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 Facility Procedures CPT4 Code: [id number] Description: 83254 - DEB SUBQ TISSUE 20 SQ CM/< ICD-10 Diagnosis Description 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

## 2020-08-15 NOTE — Progress Notes (Signed)
[name], [name] ([id number]) Visit Report for 08/12/2020 Arrival Information Details Patient Name: Date of Service: Nancy LLO [name] 08/12/2020 12:30 PM Medical Record Number: [id number] Patient Account Number: [id number] Date of Birth/Sex: Treating RN: 1971/02/20 (50 y.o. [name], [name] Primary Care Darilyn Storbeck: [name], [name] Other Clinician: Referring Bayleigh Loflin: Treating Takaya Hyslop/Extender: [name], [name] Weeks in Treatment: 69 Visit Information History Since Last Visit Added or deleted any medications: No Patient Arrived: Wheel Chair Any [name] allergies or adverse reactions: No Arrival Time: 12:51 Had a fall or experienced change in No Accompanied By: self activities of daily living that may affect Transfer Assistance: None risk of falls: Patient Identification Verified: Yes Signs or symptoms of abuse/neglect since last visito No Secondary Verification Process Completed: Yes Hospitalized since last visit: No Patient Requires Transmission-Based Precautions: No Implantable device outside of the clinic excluding No Patient Has Alerts: No cellular tissue based products placed in the center since last visit: Has Dressing in Place as Prescribed: Yes Pain Present Now: No Electronic Signature(s) Signed: 08/15/2020 6:08:57 PM By: Rhae Hammock RN Entered By: Rhae Hammock on 08/12/2020 12:51:24 -------------------------------------------------------------------------------- Encounter Discharge Information Details Patient Name: Date of Service: Nancy LLO [address] [name] [name] 08/12/2020 12:30 PM Medical Record Number: [id number] Patient Account Number: [id number] Date of Birth/Sex: Treating RN: [birth year] (50 y.o. [name] Primary Care Bricia Taher: [name], [name] Other Clinician: Referring [name]: Treating Khalessi Blough/Extender: [name], [name] Weeks in Treatment: 101 Encounter Discharge Information Items Post Procedure  Vitals Discharge Condition: Stable Temperature (F): 98.7 Ambulatory Status: Wheelchair Pulse (bpm): 80 Discharge Destination: Home Respiratory Rate (breaths/min): 18 Transportation: Other Blood Pressure (mmHg): 172/74 Accompanied By: self Schedule Follow-up Appointment: Yes Clinical Summary of Care: Patient Declined Notes SCAT Electronic Signature(s) Signed: 08/15/2020 4:50:33 PM By: Baruch Gouty RN, BSN Entered By: Baruch Gouty on 08/12/2020 14:43:19 -------------------------------------------------------------------------------- Lower Extremity Assessment Details Patient Name: Date of Service: Nancy LLO [name] 08/12/2020 12:30 PM Medical Record Number: [id number] Patient Account Number: [id number] Date of Birth/Sex: Treating RN: Oct 24, 1970 (50 y.o. [name], [name] Primary Care Naelani Lafrance: [name], [name] Other Clinician: Referring [name]: Treating [name]/Extender: [name], [name] Weeks in Treatment: 101 Edema Assessment Assessed: [Left: Yes] [Right: Yes] Edema: [Left: No] [Right: No] Calf Left: Right: Point of Measurement: From Medial Instep 31 cm 30 cm Ankle Left: Right: Point of Measurement: From Medial Instep 21 cm 20 cm Vascular Assessment Pulses: Dorsalis Pedis Palpable: [Left:Yes] [Right:Yes] Posterior Tibial Palpable: [Left:Yes] [Right:Yes] Electronic Signature(s) Signed: 08/15/2020 6:08:57 PM By: Rhae Hammock RN Entered By: Rhae Hammock on 08/12/2020 12:54:36 -------------------------------------------------------------------------------- Multi Wound Chart Details Patient Name: Date of Service: Nancy LLO [name] 08/12/2020 12:30 PM Medical Record Number: [id number] Patient Account Number: [id number] Date of Birth/Sex: Treating RN: 28-Apr-1970 (50 y.o. [name] Primary Care Abriel Geesey: [name], [name] Other Clinician: Referring Keyle Doby: Treating Gerilynn Mccullars/Extender: [name],  [name] Weeks in Treatment: 101 Vital Signs Height(in): 67 Capillary Blood Glucose(mg/dl): 240 Weight(lbs): 125 Pulse(bpm): 76 Body Mass Index(BMI): 20 Blood Pressure(mmHg): 172/74 Temperature(F): 98.7 Respiratory Rate(breaths/min): 17 Photos: [43:No Photos Right, Medial Foot] [50:No Photos Left, Plantar Foot] [51:No 55 Left T Second oe] Wound Location: [43:Gradually Appeared] [50:Skin Tear/Laceration] [51:Trauma] Wounding Event: [43:Diabetic Wound/Ulcer of the Lower] [50:Diabetic Wound/Ulcer of the Lower] [51:Diabetic Wound/Ulcer of the Lower] Primary Etiology: [43:Extremity Cataracts, Chronic sinus] [50:Extremity Cataracts, Chronic sinus] [51:Extremity Cataracts, Chronic sinus] Comorbid History: [43:problems/congestion,  Anemia, Sleep Apnea, Deep Vein Thrombosis, Hypertension, Peripheral Arterial Disease, Type I Diabetes, Osteoarthritis, Osteomyelitis, Neuropathy, Seizure Disorder 08/04/2018] [50:problems/congestion, Anemia, Sleep  Apnea, Deep Vein Thrombosis, Hypertension, Peripheral Arterial Disease, Type I Diabetes, Osteoarthritis, Osteomyelitis, Neuropathy, Seizure Disorder 06/24/2020] [51:problems/congestion, Anemia, Sleep Apnea, Deep Vein Thrombosis, Hypertension, Peripheral  Arterial Disease, Type I Diabetes, Osteoarthritis, Osteomyelitis, Neuropathy, Seizure Disorder 07/29/2020] Date Acquired: [43:101] [50:7] [51:2] Weeks of Treatment: [43:Healed - Epithelialized] [50:Open] [51:Open] Wound Status: [43:Yes] [50:No] [51:No] Clustered Wound: [43:0x0x0] [50:0.4x0.3x0.1] [51:1x1x0.1] Measurements L x W x D (cm) [43:0] [50:0.094] [51:0.785] A (cm) : rea [43:0] [50:0.009] [51:0.079] Volume (cm) : [43:100.00%] [50:52.00%] [51:33.40%] % Reduction in A [43:rea: 100.00%] [50:76.90%] [51:77.60%] % Reduction in Volume: [43:Grade 4] [50:Grade 1] [51:Grade 2] Classification: [43:N/A] [50:Medium] [51:Medium] Exudate A mount: [43:N/A] [50:Serosanguineous] [51:Serosanguineous] Exudate Type:  [43:N/A] [50:red, brown] [51:red, brown] Exudate Color: [43:N/A] [50:Well defined, not attached] [51:Indistinct, nonvisible] Wound Margin: [43:N/A] [50:None Present (0%)] [51:Large (67-100%)] Granulation A mount: [43:N/A] [50:N/A] [51:Pink] Granulation Quality: [43:N/A] [50:Large (67-100%)] [51:Small (1-33%)] Necrotic A mount: [43:N/A] [50:Eschar] [51:Adherent Slough] Necrotic Tissue: [43:N/A] [50:Medium (34-66%)] [51:None] Epithelialization: [43:N/A] [50:Debridement - Excisional] [51:Debridement - Excisional] Debridement: Pre-procedure Verification/Time Out N/A [50:13:10] [51:13:10] Taken: [43:N/A] [50:Other] [51:Other] Pain Control: [43:N/A] [50:Necrotic/Eschar, Subcutaneous] [51:Subcutaneous] Tissue Debrided: [43:N/A] [50:Skin/Subcutaneous Tissue] [51:Skin/Subcutaneous Tissue] Level: [43:N/A] [50:0.12] [51:1] Debridement A (sq cm): [43:rea N/A] [50:Curette] [51:Curette] Instrument: [43:N/A] [50:Minimum] [51:Minimum] Bleeding: [43:N/A] [50:Pressure] [51:Pressure] Hemostasis A chieved: [43:N/A] [50:Procedure was tolerated well] [51:Procedure was tolerated well] Debridement Treatment Response: [43:N/A] [50:0.4x0.3x0.1] [51:1x1x0.1] Post Debridement Measurements L x W x D (cm) [43:N/A] [50:0.009] [51:0.079] Post Debridement Volume: (cm) [43:N/A] [50:Debridement] [51:Debridement] Treatment Notes Electronic Signature(s) Signed: 08/12/2020 5:00:10 PM By: Linton Ham MD Signed: 08/12/2020 5:32:23 PM By: Lorrin Jackson Entered By: Linton Ham on 08/12/2020 13:38:40 -------------------------------------------------------------------------------- Multi-Disciplinary Care Plan Details Patient Name: Date of Service: Nancy LLO [address] [name] [name] 08/12/2020 12:30 PM Medical Record Number: [id number] Patient Account Number: [id number] Date of Birth/Sex: Treating RN: 1970/11/02 (50 y.o. [name] Primary Care Arrie Borrelli: [name], [name] Other Clinician: Referring Kennet Mccort: Treating  Jhamari Markowicz/Extender: [name], [name] Weeks in Treatment: [place] reviewed with physician Active Inactive Nutrition Nursing Diagnoses: Imbalanced nutrition Impaired glucose control: actual or potential Potential for alteratiion in Nutrition/Potential for imbalanced nutrition Goals: Patient/caregiver agrees to and verbalizes understanding of need to use nutritional supplements and/or vitamins as prescribed Date Initiated: 09/03/2018 Date Inactivated: 10/07/2018 Target Resolution Date: 10/01/2018 Goal Status: Met Patient/caregiver will maintain therapeutic glucose control Date Initiated: 09/03/2018 Target Resolution Date: 08/19/2020 Goal Status: Active Interventions: Provide education on elevated blood sugars and impact on wound healing Treatment Activities: Patient referred to Primary Care Physician for further nutritional evaluation : 09/03/2018 Notes: 07/29/20: Glucose control ongoing, target date extended. Wound/Skin Impairment Nursing Diagnoses: Impaired tissue integrity Knowledge deficit related to ulceration/compromised skin integrity Goals: Patient/caregiver will verbalize understanding of skin care regimen Date Initiated: 09/03/2018 Target Resolution Date: 08/19/2020 Goal Status: Active Ulcer/skin breakdown will have a volume reduction of 30% by week 4 Date Initiated: 09/03/2018 Date Inactivated: 10/07/2018 Target Resolution Date: 10/01/2018 Goal Status: Unmet Unmet Reason: COMORBITIES Ulcer/skin breakdown will have a volume reduction of 50% by week 8 Date Initiated: 10/07/2018 Date Inactivated: 11/03/2018 Target Resolution Date: 10/31/2018 Goal Status: Unmet Unmet Reason: Osteomyelitis Interventions: Assess patient/caregiver ability to obtain necessary supplies Assess patient/caregiver ability to perform ulcer/skin care regimen upon admission and as needed Assess ulceration(s) every visit Provide education on ulcer and skin care Treatment  Activities: Skin care regimen initiated :  09/03/2018 Topical wound management initiated : 09/03/2018 Notes: Electronic Signature(s) Signed: 08/12/2020 1:09:03 PM By: Lorrin Jackson Entered By: Lorrin Jackson on 08/12/2020 13:09:02 -------------------------------------------------------------------------------- Pain Assessment Details Patient Name: Date of Service: [name] [name] 08/12/2020 12:30 PM Medical Record Number: [id number] Patient Account Number: [id number] Date of Birth/Sex: Treating RN: [birth year] (50 y.o. [name], [name] Primary Care Dahlton Hinde: [name], [name] Other Clinician: Referring Ambermarie Honeyman: Treating Aniyla Harling/Extender: [name], [name] Weeks in Treatment: 101 Active Problems Location of Pain Severity and Description of Pain Patient Has Paino No Site Locations Pain Management and Medication Current Pain Management: Electronic Signature(s) Signed: 08/15/2020 6:08:57 PM By: Rhae Hammock RN Entered By: Rhae Hammock on 08/12/2020 12:52:04 -------------------------------------------------------------------------------- Patient/Caregiver Education Details Patient Name: Date of Service: Nancy LLO [name] 5/13/2022andnbsp12:30 PM Medical Record Number: [id number] Patient Account Number: [id number] Date of Birth/Gender: Treating RN: April 10, 1970 (50 y.o. [name] Primary Care Physician: [name], [name] Other Clinician: Referring Physician: Treating Physician/Extender: [name], [name] Weeks in Treatment: 101 Education Assessment Education Provided To: Patient Education Topics Provided Elevated Blood Sugar/ Impact on Healing: Methods: Explain/Verbal, Printed Responses: State content correctly Wound/Skin Impairment: Methods: Explain/Verbal, Printed Responses: State content correctly Electronic Signature(s) Signed: 08/12/2020 5:32:23 PM By: Lorrin Jackson Entered By: Lorrin Jackson on 08/12/2020  13:09:49 -------------------------------------------------------------------------------- Wound Assessment Details Patient Name: Date of Service: Nancy [name] 08/12/2020 12:30 PM Medical Record Number: [id number] Patient Account Number: [id number] Date of Birth/Sex: Treating RN: [birth year] (50 y.o. [name], [name] Primary Care Sairah Knobloch: [name], [name] Other Clinician: Referring Sady Monaco: Treating Natahlia Hoggard/Extender: [name], [name] Weeks in Treatment: 101 Wound Status Wound Number: 43 Primary Diabetic Wound/Ulcer of the Lower Extremity Etiology: Wound Location: Right, Medial Foot Wound Healed - Epithelialized Wounding Event: Gradually Appeared Status: Date Acquired: 08/04/2018 Comorbid Cataracts, Chronic sinus problems/congestion, Anemia, Sleep Weeks Of Treatment: 101 History: Apnea, Deep Vein Thrombosis, Hypertension, Peripheral Arterial Clustered Wound: Yes Disease, Type I Diabetes, Osteoarthritis, Osteomyelitis, Neuropathy, Seizure Disorder Wound Measurements Length: (cm) 0 Width: (cm) 0 Depth: (cm) 0 Area: (cm) 0 Volume: (cm) 0 % Reduction in Area: 100% % Reduction in Volume: 100% Wound Description Classification: Grade 4 Electronic Signature(s) Signed: 08/12/2020 5:32:23 PM By: Lorrin Jackson Signed: 08/15/2020 6:08:57 PM By: Rhae Hammock RN Entered By: Lorrin Jackson on 08/12/2020 13:13:57 -------------------------------------------------------------------------------- Wound Assessment Details Patient Name: Date of Service: Nancy LLO [name] 08/12/2020 12:30 PM Medical Record Number: [id number] Patient Account Number: [id number] Date of Birth/Sex: Treating RN: [birth year] (50 y.o. [name], [name] Primary Care Burel Kahre: [name], [name] Other Clinician: Referring Cha Gomillion: Treating Vint Pola/Extender: [name], [name] Weeks in Treatment: 101 Wound Status Wound Number: 58 Primary Diabetic  Wound/Ulcer of the Lower Extremity Etiology: Wound Location: Left, Plantar Foot Wound Open Wounding Event: Skin Tear/Laceration Status: Date Acquired: 06/24/2020 Comorbid Cataracts, Chronic sinus problems/congestion, Anemia, Sleep Weeks Of Treatment: 7 History: Apnea, Deep Vein Thrombosis, Hypertension, Peripheral Arterial Clustered Wound: No Disease, Type I Diabetes, Osteoarthritis, Osteomyelitis, Neuropathy, Seizure Disorder Photos Wound Measurements Length: (cm) 0.4 Width: (cm) 0.3 Depth: (cm) 0.1 Area: (cm) 0.094 Volume: (cm) 0.009 % Reduction in Area: 52% % Reduction in Volume: 76.9% Epithelialization: Medium (34-66%) Tunneling: No Undermining: No Wound Description Classification: Grade 1 Wound Margin: Well defined, not attached Exudate Amount: Medium Exudate Type: Serosanguineous Exudate Color: red, brown Foul Odor After Cleansing: No Slough/Fibrino No Wound Bed Granulation Amount: None  Present (0%) Exposed Structure Necrotic Amount: Large (67-100%) Fascia Exposed: No Necrotic Quality: Eschar Fat Layer (Subcutaneous Tissue) Exposed: Yes Tendon Exposed: No Muscle Exposed: No Joint Exposed: No Bone Exposed: No Treatment Notes Wound #50 (Foot) Wound Laterality: Plantar, Left Cleanser Wound Cleanser Discharge Instruction: Cleanse the wound with wound cleanser or normal saline prior to applying a clean dressing using gauze sponges, not tissue or cotton balls. Peri-Wound Care Topical Primary Dressing PolyMem Silver Non-Adhesive Dressing, 4.25x4.25 in Discharge Instruction: Apply to wound bed as instructed Secondary Dressing Woven Gauze Sponge, Non-Sterile 4x4 in Discharge Instruction: Apply over primary dressing as directed. Secured With Elastic Bandage 4 inch (ACE bandage) Discharge Instruction: Secure with ACE bandage as directed. Kerlix Roll Sterile, 4.5x3.1 (in/yd) Discharge Instruction: Secure with Kerlix as directed. Paper Tape, 2x10  (in/yd) Discharge Instruction: Secure dressing with tape as directed. Compression Wrap Compression Stockings Add-Ons Electronic Signature(s) Signed: 08/12/2020 4:52:32 PM By: Sandre Kitty Signed: 08/15/2020 6:08:57 PM By: Rhae Hammock RN Entered By: Sandre Kitty on 08/12/2020 16:45:27 -------------------------------------------------------------------------------- Wound Assessment Details Patient Name: Date of Service: Nancy LLO [name] 08/12/2020 12:30 PM Medical Record Number: [id number] Patient Account Number: [id number] Date of Birth/Sex: Treating RN: 21-Nov-1970 (50 y.o. [name], [name] Primary Care Tywanda Rice: [name], [name] Other Clinician: Referring Ailyn Gladd: Treating Liller Yohn/Extender: [name], [name] Weeks in Treatment: 101 Wound Status Wound Number: 51 Primary Diabetic Wound/Ulcer of the Lower Extremity Etiology: Wound Location: Left T Second oe Wound Open Wounding Event: Trauma Status: Date Acquired: 07/29/2020 Comorbid Cataracts, Chronic sinus problems/congestion, Anemia, Sleep Weeks Of Treatment: 2 History: Apnea, Deep Vein Thrombosis, Hypertension, Peripheral Arterial Clustered Wound: No Disease, Type I Diabetes, Osteoarthritis, Osteomyelitis, Neuropathy, Seizure Disorder Photos Wound Measurements Length: (cm) 1 Width: (cm) 1 Depth: (cm) 0.1 Area: (cm) 0.785 Volume: (cm) 0.079 % Reduction in Area: 33.4% % Reduction in Volume: 77.6% Epithelialization: None Tunneling: No Undermining: No Wound Description Classification: Grade 2 Wound Margin: Indistinct, nonvisible Exudate Amount: Medium Exudate Type: Serosanguineous Exudate Color: red, brown Foul Odor After Cleansing: No Slough/Fibrino Yes Wound Bed Granulation Amount: Large (67-100%) Exposed Structure Granulation Quality: Pink Fascia Exposed: No Necrotic Amount: Small (1-33%) Fat Layer (Subcutaneous Tissue) Exposed: Yes Necrotic Quality: Adherent  Slough Tendon Exposed: No Muscle Exposed: No Joint Exposed: No Bone Exposed: No Treatment Notes Wound #51 (Toe Second) Wound Laterality: Left Cleanser Soap and Water Discharge Instruction: May shower and wash wound with dial antibacterial soap and water prior to dressing change. Peri-Wound Care Topical Primary Dressing Promogran Prisma Matrix, 4.34 (sq in) (silver collagen) Discharge Instruction: Moisten collagen with saline or hydrogel Secondary Dressing Woven Gauze Sponges 2x2 in Discharge Instruction: Apply over primary dressing as directed. Secured With Elastic Bandage 4 inch (ACE bandage) Discharge Instruction: Secure with ACE bandage as directed. Paper Tape, 1x10 (in/yd) Discharge Instruction: Secure dressing with tape as directed. Conforming Stretch Gauze Bandage, Sterile 2x75 (in/in) Discharge Instruction: Secure with stretch gauze as directed. Compression Wrap Compression Stockings Add-Ons Electronic Signature(s) Signed: 08/12/2020 4:52:32 PM By: Sandre Kitty Signed: 08/15/2020 6:08:57 PM By: Rhae Hammock RN Entered By: Sandre Kitty on 08/12/2020 16:45:50 -------------------------------------------------------------------------------- Vitals Details Patient Name: Date of Service: Nancy LLO [name] 08/12/2020 12:30 PM Medical Record Number: [id number] Patient Account Number: [id number] Date of Birth/Sex: Treating RN: 1970-06-03 (50 y.o. [name], [name] Primary Care Ingris Pasquarella: [name], [name] Other Clinician: Referring Oryan Winterton: Treating Sabatino Williard/Extender: [name], [name] Weeks in Treatment: 101 Vital Signs Time Taken: 12:51 Temperature (F):  98.7 Height (in): 67 Pulse (bpm): 80 Weight (lbs): 125 Respiratory Rate (breaths/min): 17 Body Mass Index (BMI): 19.6 Blood Pressure (mmHg): 172/74 Capillary Blood Glucose (mg/dl): 240 Reference Range: 80 - 120 mg / dl Electronic Signature(s) Signed: 08/15/2020 6:08:57 PM By:  Rhae Hammock RN Entered By: Rhae Hammock on 08/12/2020 12:51:56

## 2020-08-25 ENCOUNTER — Encounter (HOSPITAL_BASED_OUTPATIENT_CLINIC_OR_DEPARTMENT_OTHER): Payer: Medicare HMO | Admitting: Internal Medicine

## 2020-08-25 ENCOUNTER — Other Ambulatory Visit: Payer: Self-pay

## 2020-08-25 DIAGNOSIS — E10621 Type 1 diabetes mellitus with foot ulcer: Secondary | ICD-10-CM | POA: Diagnosis not present

## 2020-08-25 DIAGNOSIS — L97521 Non-pressure chronic ulcer of other part of left foot limited to breakdown of skin: Secondary | ICD-10-CM

## 2020-08-25 NOTE — Progress Notes (Signed)
VERLON, CARCIONE ([id number]) Visit Report for 08/25/2020 Chief Complaint Document Details Patient Name: Date of Service: [name] 08/25/2020 12:30 PM Medical Record Number: [id number] Patient Account Number: [id number] Date of Birth/Sex: Treating RN: July 28, 1970 (50 y.o. [name] Primary Care Provider: [name], [name] Other Clinician: Referring Provider: Treating Provider/Extender: Kalman Shan MO REIRA, [name] Weeks in Treatment: 103 Information Obtained from: Patient Chief Complaint Significant right foot ulcer with right leg ulcers 4/29: Right foot ulcer, left plantar ulcer and left second toe wound Electronic Signature(s) Signed: 08/25/2020 1:40:59 PM By: Kalman Shan DO Entered By: Kalman Shan on 08/25/2020 13:35:26 -------------------------------------------------------------------------------- Debridement Details Patient Name: Date of Service: [name] 08/25/2020 12:30 PM Medical Record Number: [id number] Patient Account Number: [id number] Date of Birth/Sex: Treating RN: [birth year] (50 y.o. [name] Primary Care Provider: [name], [name] Other Clinician: Referring Provider: Treating Provider/Extender: Kalman Shan MO REIRA, [name] Weeks in Treatment: 103 Debridement Performed for Assessment: Wound #50 Left,Plantar Foot Performed By: Physician Kalman Shan, DO Debridement Type: Debridement Severity of Tissue Pre Debridement: Fat layer exposed Level of Consciousness (Pre-procedure): Awake and Alert Pre-procedure Verification/Time Out Yes - 13:15 Taken: Start Time: 13:16 Pain Control: Lidocaine 4% T opical Solution T Area Debrided (L x W): otal 0.5 (cm) x 0.5 (cm) = 0.25 (cm) Tissue and other material debrided: Viable, Non-Viable, Callus, Slough, Subcutaneous, Skin: Dermis , Skin: Epidermis, Fibrin/Exudate, Slough Level: Skin/Subcutaneous Tissue Debridement Description: Excisional Instrument:  Curette Bleeding: Moderate Hemostasis Achieved: Pressure End Time: 13:21 Procedural Pain: 0 Post Procedural Pain: 0 Response to Treatment: Procedure was tolerated well Level of Consciousness (Post- Awake and Alert procedure): Post Debridement Measurements of Total Wound Length: (cm) 0.5 Width: (cm) 0.5 Depth: (cm) 0.3 Volume: (cm) 0.059 Character of Wound/Ulcer Post Debridement: Improved Severity of Tissue Post Debridement: Fat layer exposed Post Procedure Diagnosis Same as Pre-procedure Electronic Signature(s) Signed: 08/25/2020 1:40:59 PM By: Kalman Shan DO Signed: 08/25/2020 6:02:39 PM By: Deon Pilling Entered By: Deon Pilling on 08/25/2020 13:21:39 -------------------------------------------------------------------------------- HPI Details Patient Name: Date of Service: [name] 08/25/2020 12:30 PM Medical Record Number: [id number] Patient Account Number: [id number] Date of Birth/Sex: Treating RN: [birth year] (50 y.o. [name] Primary Care Provider: [name], [name] Other Clinician: Referring Provider: Treating Provider/Extender: Kalman Shan MO REIRA, [name] Weeks in Treatment: 103 History of Present Illness HPI Description: 50 year old diabetic who is known to have type 1 diabetes which is poorly controlled last hemoglobin A1c was 11%. She comes in with a ulcerated area on the left lateral foot which has been there for over 6 months. Was recently she has been treated by [name] of podiatry who saw her last on 05/28/2016. Review of his notes revealed that the patient had incision and drainage with placement of antibiotic beads to the left foot on 04/11/2016 for possible osteomyelitis of the cuboid bone. Over the last year she's had a history of amputation of the left fifth toe and a femoropopliteal popliteal bypass graft somewhere in April 2017. 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 His note [name] mentions that the  patient has been referred to me for further wound care and possibly great candidate for hyperbaric oxygen therapy due to recurrent osteomyelitis. However we do not have any x-rays of biopsy reports confirming this. He has been on several antibiotics including Bactrim and most recently is on doxycycline for an MRSA. I  understand, the patient was not a candidate for IV antibiotics as she has had previous PICC lines which resulted in blood clots in both arms. There was a x-ray report dated 04/04/2016 on [name] notes which showed evidence of fifth ray resection left foot with osteolytic changes noted to the fourth metatarsal and cuboid bone on the left. 06/13/2016 -- had a left foot x-ray which showed no acute fracture or dislocation and no definite radiographic evidence of osteomyelitis. Advanced osteopenia was seen. 06/20/2016 -- she has noticed a new wound on the right plantar foot in the region where she had a callus before. 06/27/16- the patient did have her x-ray of the right foot which showed no findings to suggest osteomyelitis. She saw her endocrinologist, [name], yesterday. Her A1c in January was 11. He also indicates mismanagement and noncompliance regarding her diabetes. She is currently on Bactrim for a lip infection. She is complaining of nausea, vomiting and diarrhea. She is unable to articulate the exact orders or dosing of the Bactrim; it is unclear when she will complete this. 07/04/2016 -- results from [facility] of ABIs with ankle waveforms were noted from 02/14/2016. The examination done on 06/27/2015 showed noncompressible ABIs with the right being 1.45 and the left being 1.33. The present examination showed a right ABI of 1.19 on the left of 1.33. The conclusion was that right normal ABI in the lower extremity at rest however compared to previous study which was noncompressible ABI may be falsely elevated side suggesting medial calcification. The left ABI suggested medial  calcification. 08/01/2016 -- the patient had more redness and pain on her right foot and did not get to come to see as noted she see her PCP or go to the ER and decided to take some leftover metronidazole which she had at home. As usual, the patient does report she feels and is rather noncompliant. 08/08/2016 -- -- x-ray of the right foot -- FINDINGS:Transmetatarsal amputation is noted. No bony destruction is noted to suggest osteomyelitis. IMPRESSION: No evidence of osteomyelitis. Postsurgical changes are seen. MRI would be more sensitive for possible bony changes. Culture has grown Serratia Marcescens -- sensitive to Bactrim, ciprofloxacin, ceftazidime she was seen by [name] on 08/06/2016. He did not find any exposed bone, muscle, tendon, ligament or joint. There was no malodor and he did a excisional debridement in the office. ============ Old notes: 50 year old patient who is known to the wound clinic for a while had been away from the wound clinic since 09/01/2014. Over the last several months she has been admitted to various hospitals including [place] at [place]. She was treated for a right metatarsal osteomyelitis with a transmetatarsal amputation and this was done about 2 months ago. He has a small ulcerated area on the right heel and she continues to have an ulcerated area on the left plantar aspect of the foot. The patient was recently admitted to the [facility] hospital group between 7/12 and 10/18/2014. she was given 3 weeks of IV vancomycin and was to follow-up with her surgeons at [facility] and also took oral vancomycin for C. difficile colitis. 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 MRI of the left foot done on 10/14/2014 showed no abscess or osteomyelitis. 04/27/15; this is a patient we know  from previous stays in the wound care center. She is a type I diabetic I am not sure of her control currently. Since the last time I saw her she is had a right transmetatarsal amputation and has no wounds on her right foot and has no open wounds. She is been followed at the wound care center at [facility] in [place]. She comes today with the desire to undergo hyperbaric treatment locally. Apparently one of her wound care providers in [place] has suggested hyperbarics. This is in response to an MRI from 04/18/15 that showed increased marrow signal and loss of the proximal fifth metatarsal cortex evidence of osteomyelitis with likely early osteomyelitis in the cuboid bone as well. She has a large wound over the base of the fifth metatarsal. She also has a eschar over her the tips of her toes on 1,3 and 5. She does not have peripheral pulses and apparently is going for an angiogram tomorrow which seems reasonable. After this she is going to infectious disease at [facility]. They have been using Medihoney to the large wound on the lateral aspect of the left foot to. The patient has known Charcot deformity from diabetic neuropathy. She also has known diabetic PAD. Surprisingly I can't see that she has had any recent antibiotics, the patient states the last antibiotic she had was at the end of November for 10 days. I think this was in response to culture that showed group G strep although I'm not exactly sure where the culture was from. She is also had arterial studies on 03/29/15. This showed a right ABI of 1.4 that was noncompressible. Her left ABI was 0.73. 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 It was not felt that arterial inflow was adequate for healing of a foot ulcer. Her Doppler waveforms looked monophasic ===== READMISSION 02/28/17; this  is in an now 50 year old woman we've had at several different occasions in this clinic. 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 She has a remote ex-smoker. She was last seen in this clinic by Dr. Con Memos I think in May. More recently she is been followed by her podiatrist [name] an infectious disease Dr. Megan Salon. She has 2 open wounds the major one is over the right first metatarsal head she also has a wound on the left plantar foot. an MRI of the right foot on 01/01/17 showed a soft tissue ulcer along the plantar aspect of the first metatarsal base consistent with osteomyelitis of the first metatarsal stump. Dr. 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 According to the patient this is been open for slightly over a month. She has been on a combination of Cipro 500 twice a day, Zyvox 600 twice a day and Flagyl 500 3 times a day for over a month now as directed by Dr. Megan Salon. cultures of the right foot earlier this year showed MRSA in January and Serratia in May. January also had a few viridans strep. Recent x-rays of both feet were done and [name] office and I don't have these reports. The patient has known PAD and has a history of aleft femoropopliteal bypass in April 2017. 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 Dwyane Dee. In spite of this the last hemoglobin A1c I can see is  10.1 on 01/01/2017. She is being referred by 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 She is been using Medihoney to this area and also an area on the left midfoot. She is using healing sandals bilaterally. ABIs in this clinic at the left posterior tibial was 1.1  noncompressible on the right READMISSION Non invasive vascular NOVANT 5/18 Aftercare following surgery of the circulatory system Procedure Note - Interface, External Ris In - 08/13/2016 11:05 AM EDT Procedure: Examination consists of physiologic resting arterial pressures of the brachial and ankle arteries bilaterally with continuous wave Doppler waveform analysis. Previous: Previous exam performed on 02/14/16 demonstrated ABIs of Rt = 1.19 and Lt = 1.33. Right: ABI = non-compressible PT 1.47 DP. S/P transmet amputation. , Left: ABI = 1.52, 2nd digit pressure = 87 mmHg Conclusions: Right: ABI (>1.3) may be falsely elevated, suggesting medial calcification. Left: ABI (>1.3) may be falsely elevated, suggesting medial calcification The patient is a now 50 year old type I diabetic is had multiple issues her graded to chronic diabetic foot ulcers. 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 We had her in the clinic lastin November. At that point she had wounds on her bilateral feet.she had wanted to try hyperbarics however the healogics review process denied her because she hadn't followed up with her vascular surgeon for her left femoropopliteal bypass. The bypass was done by Dr. Raul Del at [facility]. We made her a follow-up with Dr. Raul Del however she did not keep the appointment and therefore she was not approved The patient shows me a small wound on her left fourth metatarsal head on her phone. She developed rapid discoloration in the plantar aspect of the left foot and she was admitted to hospital from 2/2 through 05/10/17 with wet gangrene of the left foot osteomyelitis of the fourth metatarsal heads. She was admitted acutely ill with a temperature of 103. She was started on broad-spectrum vancomycin and cefepime. On 05/06/17 she was taken to the OR by [name] her podiatric surgeon for an incision and drainage  irrigation of the left foot wound. Cultures from this surgery revealed group be strep and anaerobes. she was seen by Dr.Xu of orthopedic surgery and scheduled for a below-knee amputation which she u refused. Ultimately she was discharged on Levaquin and Flagyl for one month. MRI 05/05/17 done while she was in the hospital showed abscess adjacent to the fourth metatarsal head and neck small abscess around the fourth flexor tendon. Inflammatory phlegmon and gas in the soft tissues along the lateral aspect of the fourth phalanx. Findings worrisome for osteomyelitis involving the fourth proximal and middle phalanx and also the third and fourth metatarsals. Finally the patient had actually shortly before this followed up with Dr. Raul Del at no time on 04/29/17. He felt that her left femoropopliteal bypass was patent he felt that her left-sided toe pressures more than adequate for healing a wound on the left foot. This was before her acute presentation. 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 05/28/17; she is started hyperbarics. The patient tells me that for some reason she was not actually on Levaquin but I think on ciprofloxacin. She was on Flagyl. She only started her Levaquin yesterday due to some difficulty with the pharmacy and perhaps her sister picking it up. She has an appointment with [name] tomorrow and with infectious disease early next week.  She has no new complaints 06/06/17; the patient continues in hyperbarics. She saw [name] on 05/29/17 who is her podiatric surgeon. He is elected for a transmetatarsal amputation on 06/27/17. I'm not sure at what level he plans to do this amputation. The patient is unaware She also saw Dr. Megan Salon of infectious disease who elected to continue her on current antibiotics I think this is ciprofloxacin and Flagyl. I'll need to clarify with her tomorrow if she actually has this. We're using silver alginate to the actual wound. Necrotic surface today with  material under the flap of her foot. Original MRI showed abscesses as well as osteomyelitis of the proximal and middle fourth phalanx and the third and fourth metatarsal heads 06/11/17; patient continues in hyperbarics and continues on oral antibiotics. She is doing well. The wound looks better. The necrotic part of this under the flap in her superior foot also looks better. she is been to see [name]. I haven't had a chance to look at his note. Apparently he has put the transmetatarsal amputation on hold her request it is still planning to take her to the OR for debridement and product application ACEL. I'll see if I can find his note. I'll therefore leave product ordering/requests to [name] for now. I was going to look at Dermagraft 06/18/17-she is here in follow-up evaluation for bilateral foot wounds. She continues with hyperbaric therapy. She states she has been applying manuka honey to the right plantar foot and alternate manuka honey and silver alginate to the left foot, despite our orders. We will continue with same treatment plan and she will follo up next week. 06/25/17; I have reviewed [name] last note from 3/11. She has operative debridement in 2 days' time. By review his note apparently they're going to place there is skin over the majority of this wound which is a good choice. She has a small satellite area at the most proximal part of this wound on the left plantar foot. The area on the right plantar foot we've been using silver alginate and it is close to healing. 07/02/17; unfortunately the patient was not easily approved for [name] proposed surgery. I'm not completely certain what the issue is. She has been using silver alginate to the wound she has completed a first course of hyperbarics. She is still on Levaquin and Flagyl. I have really lost track of the time course here.I suspect she should have another week to 2 of antibiotics. I'll need to see if she is followed up with  infectious disease Dr. Megan Salon 07/09/17; the patient is followed up with Dr. Megan Salon. 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 She continues on Levaquin and metronidazole continuing both of these for now I think she is been on fr about 6 weeks. She still has some drainage but no pain. No fever. Her had been plans for her to go to the OR for operative debridement with her podiatrist [name], I am not exactly sure where that is. I'll probably slip a note to [name] today. I note that she follows with Dr. Dwyane Dee of endocrinology. We have her recertified for hyperbaric oxygen. I have not heard about Dermagraft however I'll see if [name] is planning a skin substitute as well 07/16/17; the patient tells me she is just about out of [place]. I'll need to check Dr. Hale Bogus last notes on this. She states she has plenty of Flagyl however. She  comes in today complaining of pain in the right lateral foot which she said lasted for about a day. The wound on the right foot is actually much more medially. She also tells me that the [facility] cost a lot of pain in the left foot wound and she turned back to silver alginate. Finally Dermagraft has a $662 per application co-pay. She cannot afford this 07/23/17; patient arrives today with the wound not much smaller. There is not much new to add. She has not heard from [name] all try to put in a call to them today. She was asking about Dermagraft again and she has an over $947 per application co-pay she states that she would be willing to try to do a payment plan. I been tried to avoid this. We've been using silver alginate, I'll change to [facility] 07/30/17-She is here in follow-up evaluation for left foot ulcer. She continues hyperbaric medicine. The left foot ulcer is stable we will continue with same treatment plan 08/06/17; she is here for evaluation of her left foot ulcer. Currently being treated for  hyperbarics or underlying osteomyelitis. She is completed antibiotics. The left foot ulcer is better smaller with healthier looking granulation. For various reasons I am not really clear on we never got her back to the OR with [name]. He did not respond to my secure text message. Nevertheless I think that surgery on this point is not necessary nor am I completely clear that a skin substitute is necessary The patient is complaining about pain on the outside of her right foot. 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 There is no erythema. She also states the foot is warm versus her other part of her upper leg and this is largely true. It is not totally clear to me what's causing this. She thinks it's different from her usual neuropathy pain 08/13/17; she arrives in clinic today with a small wound which is superficial on her right first metatarsal head. 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 She tells [place] she was up on her feet over the Mother's Day celebration. The large wound is on the left foot. Continues with hyperbarics for underlying osteomyelitis. We're using Hydrofera Blue. She asked me today about where we were with Dermagraft. I had actually excluded this because of the co-pay however she wants to assume this therefore I'll recheck the co-pay an order for next week. 08/20/17; the patient agreed to accept the co-pay of the first Apligraf which we applied today. She is disappointed she is finishing hyperbarics will run this through the insurance on the extent of the foot infection and the extent of the wound that she had however she is already had 60 dive's. Dermagraft No. 1 08/27/17; Dermagraft No. 2. She is not eligible for any more hyperbaric treatments this month. She reports a fair amount of drainage and she actually changed to the external dressings without disturbing the direct contact layer 09/03/17; the patient arrived in clinic today with the wound  superficially looking quite healthy. Nice vibrant red tissue with some advancing epithelialization although not as much adherence of the flap as I might like. However she noted on her own fourth toe some bogginess and she brought that to our attention. Indeed this was boggy feeling like a possibility of subcutaneous fluid. 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 She is not been unwell. We've  been using Dermagraft 09/10/17; the culture that I did not last week was MRSA. She saw Dr. Megan Salon this morning who is going to start her on vancomycin. I had sent him a secure a text message yesterday. I also spoke with her podiatric surgeon [name] about surgery on this foot the options for conserving a functional foot etc. Promised me he would see her and will make back consultation today. Paradoxically her actual wound on the plantar aspect of her left foot looks really quite good. I had given her 5 days worth of Baxdella to cover her for MRSA. Her MRI came back showing osteomyelitis within the third metatarsal shaft and head and base of the third and fourth proximal phalanx. She had extensive inflammatory changes throughout the soft tissue of the lateral forefoot. With an ill-defined fluid around the fourth metatarsal extending into the plantar and dorsal soft tissues 09/19/17; the patient is actually on oral Septra and Flagyl. She apparently refused IV vancomycin. She also saw [name] at my request who is planning her for a left BKA sometime in mid July. MRI showed osteomyelitis within the third metatarsal shaft and head and the basis of the third and fourth proximal phalanx. I believe there was felt to be possible septic arthritis involving the third MTP. 09/26/17; the patient went back to Dr. Megan Salon at my suggestion and is now receiving IV daptomycin. Her wound continues to look quite good making the decision to proceed with a  transmetatarsal amputation although more difficult for the patient. I believe in my extensive discussions with her she has a good sense of the pros and cons of this. I don't NV the tuft decision she has to make. She has an appointment with [name] I believe in mid July and I previously spoken to him about this issue Has we had used 3 previous Dermagraft. Given the condition of the wound surface I went ahead and added the fourth one today area and I did this not fully realizing that she'll be traveling to [place] next week. I'm hopeful she can come back in 2 weeks 10/21/17; Her same Dermagraft on for about 3-1/2 weeks. In spite of this the wound arrives looking quite healthy. There is been a lot of healing dimensions are smaller. Looking at the square shaped wound she has now there is some undermining and some depth medially under the undermining although I cannot palpate any bone. No surrounding infection is obvious. She has difficult questions about how to look at this going forward vis--vis amputations versus continued medical therapy. T be truthful the wound is looks o so healthy and it is continued to contract. Hard to justify foot surgery at this point although I still told her that I think it might come to that if we are not able to eradicate the underlying MRSA. She is still highly at risk and she understands this 11/06/17 on evaluation today patient appears to be doing better in regard to her foot ulcer. She's been tolerating the dressing changes without complication. Currently she is here for her Dermagraft #6. Her wound continues to make excellent progress at this point. She does not appear to have any evidence of infection which is good news. 11/13/17 on evaluation today patient appears to be doing excellent at this time. She is here for repeat Dermagraft application. This is #7. Overall her wound seems to be making great progress. 12/05/17; the patient arrives with the wound in much better  condition than when I last  saw this almost 6 weeks ago. She still has a small probing area in the left metatarsal head region on the lateral aspect of her foot. We applied her last Dermagraft today. Since the last time she is here she has what appears to a been a blood blister on the plantar aspect of left foot although I don't see this is threatening. There is also a thick raised tissue on the right mid metatarsal head region. This was not there I don't think the last time she was here 3 weeks ago. 12/12/17; the patient continues to have a small programming area in the left metatarsal head region on the lateral aspect of her foot which was the initial large surgical wound. I applied her last Apligraf last week. I'm going to use Endoform starting today Unfortunately she has an excoriated area in the left mid foot and the right mid foot. The left midfoot looks like a blistered area this was not opened last week it certainly is open today. Using silver alginate on these areas. She promises me she is offloading this. 12/19/17; the small probing area in the left metatarsal head eyes think is shallower. In general her original wound looks better. We've been using Endoform. The area inferiorly that I think was trauma last week still requires debridement a lot of nonviable surface which I removed. She still has an open open area distally in her foot Similarly on the right foot there is tightly adherent surface debris which I removed. Still areas that don't look completely epithelialized. This is a small open area. We used silver alginate on these areas 12/26/2017; the patient did not have the supplies we ordered from last week including the Endoform. The original large wound on the left lateral foot looks healthy. She still has the undermining area that is largely unchanged from last week. She has the same heavily callused raised edged wounds on the right mid and left midfoot. Both of these requiring  debridement. We have been using silver alginate on these areas 01/02/2018; there is still supply issues. We are going to try to use Prisma but I am not sure she actually got it from what she is saying. She has a new open area on the lateral aspect of the left fourth toe [previous fifth ray amputation]. Still the one tunneling area over the fourth metatarsal head. The area is in the midfoot bilaterally still have thick callus around them. She is concerned about a raised swelling on the lateral aspect of the foot. However she is completely insensate 01/10/2018; we are using Prisma to the wounds on her bilateral feet. Surprisingly the tunneling area over the left fourth metatarsal head that was part of her original surgery has closed down. She has a small open area remaining on the incision line. 2 open areas in the midfoot. 02/10/2018; the patient arrives back in clinic after a month hiatus. She was traveling to visit family in [place]. Is fairly clear she was not offloading the areas on her feet. The original wound over the left lateral foot at the level of metatarsal heads is reopened and probes medially by about a centimeter or 2. She notes that a week ago she had purulent drainage come out of an area on the left midfoot. Paradoxically the worst area is actually on the right foot is extensive with purulent drainage. We will use silver alginate today 02/17/2018; the patient has 3 wounds one over the left lateral foot. She still has a small area over  the metatarsal heads which is the remnant of her original surgical wound. This has medial probing depth of roughly 1.4 cm somewhat better than last week. The area on the right foot is larger. We have been using silver alginate to all areas. The area on the right foot and left foot that we cultured last week showed both Klebsiella and Proteus. Both of these are quinolone sensitive. The patient put her's self on Bactrim and Flagyl that she had left hanging  around from prior antibiotic usages. She was apparently on this last week when she arrived. I did not realize this. Unfortunately the Bactrim will not cover either 1 of these organisms. We will send in Cipro 500 twice daily for a week 03/04/2018; the patient has 2 wounds on the left foot one is the original wound which was a surgical wound for a deep DFU. At one point this had exposed bone. She still has an area over the fourth metatarsal head that probes about 1.4 cm although I think this is better than last week. I been using silver nitrate to try and promote tissue adherence and been using silver alginate here. She also has an area in the left midfoot. This has some depth but a small linear wound. Still requiring debridement. On the right midfoot is a circular wound. A lot of thick callus around this area. We have been using silver alginate to all wound areas She is completed the ciprofloxacin I gave her 2 weeks ago. 03/11/2018; the patient continues to have 2 open areas on the left foot 1 of which was the original surgical wound for a deep DFU. Only a small probing area remains although this is not much different from last week we have been using silver alginate. The other area is on the midfoot this is smaller linear but still with some depth. We have been using silver alginate here as well On the right foot she has a small circular wound in the mid aspect. This is not much smaller than last time. We have been using silver alginate here as well 03/18/2018; she has 3 wounds on the left foot the original surgical wound, a very superficial wound in the mid aspect and then finally the area in the mid plantar foot. She arrives in today with a very concerning area in the wound in the mid plantar foot which is her most proximal wound. There is undermining here of roughly 1-1/2 cm superiorly. Serosanguineous drainage. She tells me she had some pain on for over the weekend that shot up her foot into her  thigh and she tells me that she had a nodule in the groin area. She has the single wound in the right foot. We are using endoform to both wound areas 03/24/2018; the patient arrives with the original surgical wound in the area on the left midfoot about the same as last week. There is a collection of fluid under the surface of the skin extending from the surgical wound towards the midfoot although it does not reach the midfoot wound. The area on the right foot is about the same. Cultures from last week of the left midfoot wound showed abundant Klebsiella abundant Enterococcus faecalis and moderate methicillin resistant staph I gave her Levaquin but this would have only covered the Klebsiella. She will need linezolid 04/01/2018; she is taking linezolid but for the first few days only took 1 a day. I have advised her to finish this at twice daily dosing. In any case all  of her wounds are a lot better especially on the left foot. The original surgical wound is closed. The area on the left midfoot considerably smaller. The area on the right foot also smaller. 04/08/2018; her original surgical wound/osteomyelitis on the left foot remains closed. She has area on the left foot that is in the midfoot area but she had some streaking towards this. This is not connected with her original wound at least not visually. Small wound on the right midfoot appears somewhat smaller. 04/15/18; both wounds looks better. Original wound is better left midfoot. Using silver alginate 1/21; patient states she uses saltwater soak in, stones or remove callus from around her wounds. She is also concerned about a blood blister she had on the left foot but it simply resolved on its own. We've been using silver alginate 1/28; the patient arrives today with the same streaking area from her metatarsals laterally [the site of her original surgical wound] down to the middle of her foot. There is some drainage in the subcutaneous area  here. This concerns me that there is actually continued ongoing infection in the metatarsals probably the fourth and third. This fixates an MRI of the foot without contrast [chronic renal failure] The wound in the mid part of the foot is small but I wonder whether this area actually connects with the more distal foot. The area on the right midfoot is probably about the same. Callus thick skin around the small wound which I removed with a curette we have been using silver alginate on both wound areas 2/4; culture I did of the draining site on the left foot last time grew methicillin sensitive staph aureus. MRI of the left foot showed interval resolution of the findings surrounding the third metatarsal joint on the prior study consistent with treated osteomyelitis. Chronic soft tissue ulceration in the plantar and lateral aspect of the forefoot without residual focal fluid collection. No evidence of recurrent osteomyelitis. 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 She tells me she had nausea and vomiting over the weekend and missed 2 days. In general her foot looks much the same. She has a small open area just below the left fourth metatarsal head. A linear area in the left midfoot. Some discoloration extending from the inferior part of this into the left lateral foot although this appears to be superficial. She has a small area on the right midfoot which generally looks smaller after debridement 2/18; the patient is completing his cephalexin and has another 2 days. She continues to have open areas on the left and right foot. 2/25; she is now off antibiotics. The area on the left foot at the site of her original surgical wound has closed yet again. She still  has open areas in the mid part of her foot however these appear smaller. The area on the right mid foot looks about the same. We have been using silver alginate She tells me she had a serious hypoglycemic spell at home. She had to have EMS called and get IV dextrose 3/3; disappointing on the left lateral foot large area of necrotic tissue surrounding the linear area. This appears to track up towards the same original surgical wound. Required extensive debridement. The area on the right plantar  foot is not a lot better also using silver 3/12; the culture I did last time showed abundant enterococcus. I have prescribed Augmentin, should cover any unrecognized anaerobes as well. In addition there were a few MRSA and Serratia that would not be well covered although I did not want to give her multiple antibiotics. She comes in today with a new wound in the right midfoot this is not connected with the original wound over her MTP a lot of thick callus tissue around both wounds but once again she said she is not walking on these areas 3/17-Patient comes in for follow-up on the bilateral plantar wounds, the right midfoot and the left plantar wound. Both these are heavily callused surrounding the wounds. We are continuing to use silver alginate, she is compliant with offloading and states she uses a wheelchair fairly often at home 3/24; both wound areas have thick callus. However things actually look quite a bit better here for the majority of her left foot and the right foot. 3/31; patient continues to have thick callused somewhat irritated looking tissue around the wounds which individually are fairly superficial. There is no evidence of surrounding infection. We have been using silver alginate however I change that to [facility] today 4/17; patient returns to clinic after having a scare with Covid she tested negative in her primary doctor's office. She has been using Hydrofera Blue. She does not have an  open area on the right foot. On the left foot she has a small open area with the mid area not completely viable. She showed me pictures of what looks like a hemorrhagic blister from several days ago but that seems to have healed over this was on the lateral left foot 4/21; patient comes in to clinic with both her wounds on her feet closed. However over the weekend she started having pain in her right foot and leg up into the thigh. She felt as though she was running a low-grade fever but did not take her temperature. She took a doxycycline that she had leftover and yesterday a single Septra and metronidazole. She thinks things feel somewhat better. 4/28; duplex ultrasound I ordered last week was negative for DVT or superficial thrombophlebitis. She is completed the doxycycline I gave her. States she is still having a lot of pain in the right calf and right ankle which is no better than last week. She cannot sleep. She also states she has a temperature of up to 101, coughing and complaining of visual loss in her bilateral eyes. Apparently she was tested for Covid 2 weeks ago at [facility] and that was negative. Readmission: 09/03/18 patient presents back for reevaluation after having been evaluated at the end of April regarding erythema and swelling of her right lower extremity. Subsequently she ended up going to the hospital on 07/29/18 and was admitted not to be discharged until 08/08/18. Unfortunately it was noted during the time that she was in the hospital that she did have methicillin-resistant Staphylococcus aureus as the infection noted at the site. It was also determined that she did have osteomyelitis which appears to be fairly significant. She was treated with vancomycin and in fact is still on IV vancomycin at dialysis currently. This is actually slated to continue until 09/12/18 at least which will be the completion of the six weeks of therapy. Nonetheless based on what I'm seeing at this point I'm  not sure she will be anywhere near ready to discontinue antibiotics at that time. Since she  was released from the hospital she was seen by [name] who is her podiatrist on 08/27/18. His note specifically states that he is recommended that the patient needs of one knee amputation on the right as she has a life- threatening situation that can lead quickly to sepsis. The patient advised she would like to try to save her leg to which [name] apparently told her that this was against all medical advice. She also want to discontinue the Wound VAC which had been initiated due to the fact that she wasn't pleased with how the wound was looking and subsequently she wanted to pursue applying Medihoney at that time. He stated that he did not believe that the right lower extremity was salvageable and that the patient understood but would still like to attempt hyperbaric option therapy if it could be of any benefit. She was therefore referred back to [place] for further evaluation. He plans to see her back next week. Upon inspection today patient has a significant amount purulent drainage noted from the wound at this point. The bone in the distal portion of her foot also appears to be extremely necrotic and spongy. When I push down on the bone it bubbles and seeps purulent drainage from deeper in the end of the foot. I do not think that this is likely going to heal very well at all and less aggressive surgical debridement were undertaken more than what I believe we can likely do here in our office. 09/12/2018; I have not seen this patient since the most recent hospitalization although she was in our clinic last week. I have reviewed some of her records from a complex hospitalization. 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 There is situation was complicated by MRSA bacteremia and acute on chronic renal failure now on dialysis. She is receiving vancomycin at dialysis. We  started her on Dakin's wet-to-dry last week she is changing this daily. There is still purulent drainage coming out of her foot. Although she is apparently "agreeable" to a below-knee amputation which is been suggested by multiple clinicians she wants this to be done in [place]. She apparently has a telehealth visit with that provider sometime in late [place] 6/24. I have told her I think this is probably too long. Nevertheless I could not convince her to allow a local doctor to perform BKA. 09/19/2018; the patient has a large necrotic area on the right anterior foot. She has had previous transmetatarsal amputations. Culture I did last week showed MRSA nothing else she is on vancomycin at dialysis. She has continued leaking purulent drainage out of the distal part of the large circular wound on the right anterior foot. She apparently went to see Dr. 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 Somehow that translated into her being referred to plastic surgery for debridement of the area. I gather she basically refused amputation although I do not have a copy of Dr. Berenice Primas notes. The patient really wants to have a trial of hyperbaric oxygen. I agreed with initial assessment in this clinic that this was probably too far along to benefit however if she is going to have plastic surgery I think she would benefit from ancillary hyperbaric oxygen. The issue here is that the patient has benefited as maximally as any patient I have ever seen from hyperbaric oxygen therapy. Most recently she had exposed bone on the lateral part of her left foot after a surgical procedure  and that actually has closed. She has eschared areas in both heels but no open area. She is remained systemically well. I am not optimistic that anything can be done about this but the patient is very clear that she wants an attempt. The attempt would include a wound VAC further debridements and hyperbaric oxygen  along with IV antibiotics. 6/26; I put her in for a trial of hyperbaric oxygen only because of the dramatic response she has had with wounds on her left midfoot earlier this year which was a surgical wound that went straight to her bone over the metatarsal heads and also remotely the left third toe. We will see if we can get this through our review process and insurance. She arrives in clinic with again purulent material pouring out of necrotic bone on the top of the foot distally. There is also some concerning erythema on the front of the leg that we marked. It is bit difficult to tell how tender this is because of neuropathy. I note from infectious disease that she had her vancomycin extended. 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 She had the wound VAC on for part of the week. The rest of the time she is putting various things on this including Medihoney, "ionized water" silver sorb gel etc. 7/7; follow-up along with HBO. She is still on vancomycin at dialysis. She has a large open area on the dorsal right foot and a small dark eschar area on her heel. There is a lot less erythema in the area and a lot less tenderness. From an infection point of view I think this is better. She still has a lot of necrosis in the remaining right forefoot [previous TMA] we are still using the wound VAC in this area 7/16; follow-up along with HBO. I put her on linezolid after she finished her vancomycin. We started this last Friday I gave her 2 weeks worth. I had the expectation that she would be operatively debrided by Dr. Marla Roe but that still has not happened yet. Patient phoned the office this week. She arrives for review today after HBO. The distal part of this wound is completely necrotic. Nonviable pieces of tendon bone was still purulent drainage. Also concerning that she has black eschar over the heel that is expanding. I think this may be indicative of infection in this  area as well. She has less erythema and warmth in the ankle and calf but still an abnormal exam 7/21 follow-up along with HBO. I will renew her linezolid after checking a CBC with differential monitoring her blood counts especially her platelets. She was supposed to have surgery yesterday but if I am reading things correctly this was canceled after her blood sugar was found to be over 500. I thought Dr. Marla Roe who called me said that they were sending her to the ER but the patient states that was not the case. 7/28. Follow-up along with HBO. She is on linezolid I still do not have any lab work from dialysis even though I called last week. The patient is concerned about an area on her left lateral foot about the level of the base of her fifth metatarsal. I did not really see anything that ominous here however this patient is in [place] ability to point out problems that she is sensing and she has been accurate in the past Finally she received a call from Dr. Marla Roe who is referring her to another orthopedic surgeon stating that  she is too booked up to take her to the operating room now. Was still using a wound VAC on the foot 8/3 -Follow-up after HBO, she is got another week of linezolid, she is to call ID for an appointment, x-rays of both feet were reviewed, the left foot x-ray with third MTP joint osteo- Right foot x-ray widespread osteo-in the right midfoot Right ankle x-ray does not show any active evidence of infection 8/11-Patient is seen after HBO, the wounds on the right foot appear to be about the same, the heel wound had some necrotic base over tendon that was debrided with a curette 8/21; patient is seen after HBO. The patient's wound on her dorsal foot actually looks reasonably good and there is substantial amount of epithelialization however the open area distally still has a lot of necrotic debris partially bone. I cannot really get a good sense of just how deep this probes  under the foot. She has been pressuring me this week to order medical maggots through a company in [place] for her. The problem I have is there is not a defined wound area here. On the positive side there is no purulence. She has been to see infectious disease she is still on Septra DS although I have not had a chance to review their notes 8/28; patient is seen in conjunction with HBO. The wounds on her foot continued to improve including the right dorsal foot substantially the, the distal part of this wound and the area on the right heel. We have been using a wound VAC over this chronically. She is still on trimethoprim as directed by infectious disease 9/4; patient is seen in conjunction with HBO. Right dorsal foot wound substantially anteriorly is better however she continues to have a deep wound in the distal part of this that is not responding. We have been using silver collagen under border foam Area on the right plantar medial heel seems better. We have been using Hydrofera Blue 12/12/18 on evaluation today patient appears to be doing about the same with regard to her wound based on prior measurements. She does have some necrotic tissue noted on the lateral aspect of the wound that is going require a little bit of sharp debridement today. This includes what appears to be potentially either severely necrotic bone or tendon. Nonetheless other than that she does not appear to have any severe infection which is good news 9/18; it is been 2 weeks since I saw this wound. She is tolerating HBO well. Continued dramatic improvement in the area on the right dorsal foot. She still has a small wound on the heel that we have been using Hydrofera Blue. She continues with a wound VAC 9/24; patient has to be seen emergently today with a swelling on her right lateral lower leg. She says that she told Dr. Evette Doffing about this and also myself on a couple of occasions but I really have no recollection of this. She  is not systemically unwell and her wound really looked good the last time I saw this. She showed this to providers at dialysis and she was able to verify that she was started on cephalexin today for 5 doses at dialysis. She dialyzes on Tuesday Thursday and Saturday. 10/2; patient is seen in conjunction with HBO. The area that is draining on the right anterior medial tibia is more extensive. Copious amounts of serosanguineous drainage with some purulence. We are still using the wound VAC on the original wound then it is  stable. Culture I did of the original IandD showed MRSA I contacted dialysis she is now on vancomycin with dialysis treatments. I asked them to run a month 10/9; patient seen in conjunction with HBO. She had a new spontaneous open area just above the wound on the right medial tibia ankle. More swelling on the right medial tibia. Her wound on the foot looks about the same perhaps slightly better. There is no warmth spreading up her leg but no obvious erythema. her MRI of the foot and ankle and distal tib-fib is not booked for next Friday I discussed this with her in great detail over multiple days. it is likely she has spreading infection upper leg at least involving the distal 25% above the ankle. She knows that if I refer her to orthopedics for infectious disease they are going to recommend amputation and indeed I am not against this myself. We had a good trial at trying to heal the foot which is what she wanted along with antibiotics debridement and HBO however she clearly has spreading infection [probably staph aureus/MRSA]. Nevertheless she once again tells me she wants to wait the left of the MRI. She still makes comments about having her amputation done in [place]. 10/19; arrives today with significant swelling on the lateral right leg. Last culture I did showed Klebsiella. Multidrug-resistant. Cipro was intermediate sensitivity and that is what I have her on pending her MRI  which apparently is going to be done on Thursday this week although this seems to be moving back and forth. She is not systemically unwell. We are using silver alginate on her major wound area on the right medial foot and the draining areas on the right lateral lower leg 10/26; MRI showed extensive abscess in the anterior compartment of the right leg also widespread osteomyelitis involving osseous structures of the midfoot and portions of the hindfoot. Also suspicion for osteomyelitis anterior aspect of the distal medial malleolus. Culture I did of the purulence once again showed a multidrug-resistant Klebsiella. I have been in contact with nephrology late last week and she has been started on cefepime at dialysis to replace the vancomycin We sent a copy of her MRI report to Dr. Geroge Baseman in [place] who is an orthopedic surgeon. The patient takes great stock in his opinion on this. She says she will go to [place] to have her leg amputated if Dr. Geroge Baseman does not feel there is any salvage options. 11/2; she still is not talk to her orthopedic surgeon in [place]. Apparently he will call her at [phone] this afternoon. The quality of this is she has not allowed me to refer her anywhere. She has been told over and over that she needs this amputated but has not agreed to be referred. She tells me her blood sugar was 600 last night but she has not been febrile. 11/9; she never did got a call from the orthopedic surgeon in [place] therefore that is off the radar. We have arranged to get her see orthopedic surgery at [facility]. She still has a lot of draining purulence coming out of the new abscess in her right leg although that probably came from the osteomyelitis in her right foot and heel. Meanwhile the original wound on the right foot looks very healthy. Continued improvement. The issue is that the last MRI showed osteomyelitis in her right foot extensively she now has an abscess in the right anterior  lower leg. There is nobody in [place] who will offer this woman anything  but an amputation and to be honest that is probably what she needs. I think she still wants to talk about limb salvage although at this point I just do not see that. She has completed her vancomycin at dialysis which was for the original staph aureus she is still on cefepime for the more recent Klebsiella. She has had a long course of both of these antibiotics which should have benefited the osteomyelitis on the right foot as well as the abscess. 11/16; apparently [place] elective surgery is shut down because of COVID-19 pandemic. I have reached out to some contacts at [facility] to see if we can get her an orthopedic appointment there. I am concerned about continually leaving this but for the moment everything is static. In fact her original large wound on this foot is closing down. It is the abscess on the right anterior leg that continues to drain purulent serosanguineous material. She is not currently on any antibiotics however she had a prolonged course of vancomycin [1 month] as well as cefepime for a month 02/24/2019 on evaluation today patient appears to be doing better than the last time I saw her. This is not a patient that I typically see. With that being said I am covering for Dr. Dellia Nims this week and again compared to when I last saw her overall the wounds in particular seem to be doing significantly better which is good news. With that being said the patient tells me several disconcerting things. She has not been able to get in to see anyone for potential debridement in regard to her leg wounds although she tells me that she does not think it is necessary any longer because she is taking care of that herself. She noticed a string coming out of the lower wound on her leg over the last week. The patient states that she subsequently decided that we must of pack something in there and started pulling the string out  and as it kept coming and coming she realized this was likely her tendon. With that being said she continued to remove as much of this as she could. She then I subsequently proceeded to using tubes of antibiotic ointment which she will stick down into the wound and then scored as much as she can until she sees it coming out of the other wound opening. She states that in doing this she is actually made things better and there is less redness and irritation. With regard to her foot wound she does have some necrotic tendon and tissue noted in one small corner but again the actual wound itself seems to be doing better with good granulation in general compared to my last evaluation. 12/7; continued improvement in the wound on the substantial part of the right medial foot. Still a necrotic area inferiorly that required debridement but the rest of this looks very healthy and is contracting. She has 2 wounds on the right lateral leg which were her original drainage sites from her abscess but all of this looks a lot better as well. She has been using silver alginate after putting antibiotic biotic ointment in one wound and watching it come out the other. I have talked to her in some detail today. I had given her names of orthopedic surgeons at [facility] for second opinion on what to do about the right leg. I do not think the patient never called them. She has not been able to get a hold of the orthopedic surgeon in [place] that she  had put a lot of faith in as being somebody would give her an opinion that she would trust. I talked to her today and said even if I could get her in to another orthopedic surgeon about the leg which she accept an amputation and she said she would not therefore I am not going to press this issue for the moment 12/14; continued improvement in his substantial wound on the right medial foot. There is still a necrotic area inferiorly with tightly adherent necrotic debris which I have  been working on debriding each time she is here. She does not have an orthopedic appointment. Since last time she was here I looked over her cultures which were essentially MRSA on the foot wound and gram-negative rods in the abscess on the anterior leg. 12/21; continued improvement in the area on the right medial foot. She is not up on this much and that is probably a good thing since I do not know it could support continuous ambulation. She has a small area on the right lateral leg which were remanence of the IandD's I did because of the abscess. I think she should probably have prophylactic antibiotics I am going to have to look this over to see if we can make an intelligent decision here. In the meantime her major wound is come down nicely. Necrotic area inferiorly is still there but looks a lot better 04/06/2019; she has had some improvement in the overall surface area on the right medial foot somewhat narrowedr both but somewhat longer. The areas on the right lateral leg which were initial IandD sites are superficial. Nothing is present on the right heel. We are using silver alginate to the wound areas 1/18; right medial foot somewhat smaller. Still a deep probing area in the most distal recess of the wound. She has nothing open on the right leg. She has a new wound on the plantar aspect of her left fourth toe which may have come from just pulling skin. The patient using Medihoney on the wound on her foot under silver alginate. I cannot discourage her from this 2/1; 2-week follow-up using silver alginate on the right foot and her left fourth toe. The area on the right dorsal foot is contracted although there is still the deep area in the most distal part of the wound but still has some probing depth. No overt infection 2/15; 2-week follow-up. She continues to have improvement in the surface area on the dorsal right foot. Even the tunneling area from last time is almost closed. The area that was on  the plantar part of her left fourth toe over the PIP is indeed closed 3/1; 2-week follow-up. Continued improvement in surface area. The original divot that we have been debriding inferiorly I think has full epithelialization although the epithelialization is gone down into the wound with probably 4 mm of depth. Even under intense illumination I am unable to see anything open here. The remanence of the wound in this area actually look quite healthy. We have been using silver alginate 3/15; 2-week follow-up. Unfortunately not as good today. She has a comma shaped wound on the dorsal foot however the upper part of this is larger. Under illumination debris on the surface She also tells [place] that she was on her right leg 2 times in the last couple of weeks mostly to reach up for things above her head etc. She felt a sharp pain in the right leg which she thinks is somewhere from the  ankle to the knee. The patient has neuropathy and is really uncertain. She cannot feel her foot so she does not think it was coming from there 3/29; 2-week follow-up. Her wound measures smaller. Surface of the wound appears reasonable. She is using silver alginate with underlying Medihoney. She has home health. X-rays I did of her tib-fib last time were negative although it did show arterial calcification 4/12; 2-week follow-up. Her wound measures smaller in length. Using manuka honey with silver alginate on top. She has home health. 4/26; 2-week follow-up. Her wound is smaller but still very adherent debris under illumination requiring debridement she has been using manuka honey with silver alginate. 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 Been using Hydrofera Blue. She is on suppressive Bactrim but apparently she has been taking it twice daily 6/7; I have not seen this wound and about 6 weeks. Since then she was up in  [place]. By her own admission she was walking on the foot because she did not have a wheelchair. The wound is not nearly as healthy looking as it was the last time I saw this. We ordered different things for her but she only uses Medihoney and silver alginate. As far as I know she is on suppressive trimethoprim sulfamethoxazole. She does not admit to any fever or chills. Her CBGs apparently are at baseline however she is saying that she feels some discomfort on the lateral part of her ankle I looked over her last inflammatory markers from the summer 2020 at which time she had a deeply necrotic infected wound in this area. On 11/10/2018 her sedimentation rate was 56 and C-reactive protein 9.9. This was 107 and 29 on 07/29/2018. 6/17; the patient had a necrotic wound the last time she was here on the right dorsal foot. After debridement I did a culture. This showed a very resistant ESBL Klebsiella as well as Enterococcus. Her x-ray of the foot which was done because of warmth and some discomfort showed bone destruction within the carpal bones involving the navicular acute cuboid lateral middle cuneiforms but essentially unchanged from her prior study which was done on 10/29/2018. The findings were felt to represent chronic osteomyelitis. We did inflammatory markers on her. Her white count was 5.25 sedimentation rate 16 and C-reactive protein at 11.1. Notable for the fact that in August 2020 her CRP was 9.9 and sedimentation rate 56. I have looked at her x-rays. It is true that the bone destruction is very impressive however the patient came into this clinic for the wound on her right foot with pieces of bone literally falling out anteriorly with purulent material. I am not exactly sure I could have expected anything different. She has not been systemically unwell no fever chills or blood sugars have been reasonable. 6/28; she arrives with a right heel closed. The substantial area on the right anterior foot  looks healthy. Much better looking surface. I think we can change to [facility] seems to help this previously. She is getting her antibiotics at dialysis she should be just about finished 7/9; changed to [facility] last week. Surface wound looks satisfactory not much change in surface area however. She is going to [place] state next week this is usually a difficult thing for this patient follow-up will be for 2 weeks. 7/23; using Hydrofera Blue. She returns from her trip and the wound looks  surprisingly good. Usually when this patient goes on trips she comes back with a lot of problems with the wound. She is saying that she sometimes feels an episodic "crunching" feeling on the lateral part of the foot. She is neuropathic and not feeling pain but wonders whether this could be a neuropathic dysesthesia. 11/13/19-Patient returns after 3 weeks, the wound itself is stable and patient states that there is nothing new going on she is on some extra anxiety medications and is resisting the temptation to pick at the dry skin around the wound. 9/20; patient has not been here in over a month and I have not seen her in 2 months. The wound in terms of size I think is about the same. There is no exposed bone. She has a nonviable surface on this. She is supposed to be using [facility] however she is also been using some form of honey preparation as well as a silver-based dressings. I do not think she has any pattern to this. 10/4; 2-week follow-up. Patient has been using some form of spray which she says has honey and silver to purchase this online she has been covering it with gauze. In spite of this the wound actually looks quite good. The deeper divot distally appears to be close down. There is a rim of epithelialization. 10/18; 2-week follow-up. Patient has been using her Hydrofera Blue covered with her silver honey spray that she got online. 11/1; 2-week follow-up. She is using Hydrofera Blue with  a silver honey spray. Wound bed is measuring smaller. She has noticed that her foot is warmer on the right. She is concerned about infection. For a long period of time I had her on prophylactic trimethoprim sulfamethoxazole DS 1 tablet daily. She is asking for this to be restarted. The patient is walking on this foot because of repairs that are being done in a home her but her room is on the second floor she has to go up and down stairs. I have cautioned against this however as usual she will do exactly what she wants to do 11/15; 2-week follow-up. She uses Hydrofera Blue with a silver/honey spray which I have never heard of. I think her wound looks about the same. Some epithelialization. No evidence that this is infected. I think she is walking on this more than we agreed on. She is going on extensive vacation over Thanksgiving 12/3; 2-week follow-up. She is using Hydrofera Blue however over Thanksgiving she ran out of this and she is simply been using Medihoney. In spite of this her wound is smaller almost divided into 2 now. She traveled extensively over Thanksgiving and actually looks quite good in spite of this. Usually this is been a marker of problems for her 12/17; 2-week follow-up. She is using Hydrofera Blue. The wound is smaller. Debris on the surface of this is fibrinous. She is traveling to [place] over the holidays which never bodes well for her wounds. I think she is walking more on her feet then she is even willing to admit and she tells me she does walk 2/11; using Hydrofera Blue. Her wounds are contracting however she walks in the clinic with a history that she has not been able to eat she has had vomiting. She also has right eye problems. Her blood sugar was 567. She had blood work done at dialysis and we called there to get her blood work although it still had not returned although they should have this by the end of  the day we were told. She also stated that she was not sure what  her blood sugar was as her glucose monitor was not working and not coming to tomorrow. She also for some reason does not think her insulin pump is working well. Her endocrinologist is Dr. Elayne Snare 06/03/2020 upon evaluation today patient actually appears to be doing excellent in regard to her wound. In fact she tells me it was not even bleeding until she picked a dry piece of skin off while we were getting ready to come in and see her today. Fortunately there is no signs of active infection which is great news and overall very pleased. She is going to be seeing her neurologist sometime shortly I believe it might even be on Monday. Nonetheless there can proceed with the work-up as far as anything else going on with her eye the fact that her right eyelid is drooping. 3/25; right medial dorsal foot. She has superficial areas here that appear to be fully epithelialized she is using Medihoney and silver alginate and some combination. She has a new area on the mid left foot at roughly the level of the fifth metatarsal base 4/84/8; right medial dorsal foot. Still superficial areas here. I cleaned up 1 of these today she has been using I think mostly Medihoney but I would like her to use silver alginate. The area on the mid part of her foot also looks improved on the left. Since she was last here she tells me that she noted pus in the area of her left foot. She told dialysis about this they did a culture and ultimately she is on IV antibiotics but were not sure which one. They referred her to [name] he said that he did not need to follow her. I have not really verified any of this and I do not know what antibiotic she is on at dialysis 4/29; patient has been using Medihoney to her wounds. She reports taking off the toenail to her left second toe. She states that home health discharged her and she has not had help with dressing changes. She denies any signs and symptoms of infection. 5/13; 2-week  follow-up. Miraculously the wound on the right foot dorsally is healed. She has an area on the tip of her left third toe and an area in the left midfoot. 5/26; patient presents for 2-week follow-up. She has 2 open wounds 1 to her plantar foot and the other to the left third toe. She has noticed increased swelling and redness to the toe wound. It is unclear exactly what she is doing for dressing changes as she has multiple dressing options at home. She states that it is easiest to do [facility] on the plantar wound and Prisma or Medihoney on the toe wound. Electronic Signature(s) Signed: 08/25/2020 1:40:59 PM By: Kalman Shan DO Entered By: Kalman Shan on 08/25/2020 13:36:46 -------------------------------------------------------------------------------- Physical Exam Details Patient Name: Date of Service: Lorelee New [name] 08/25/2020 12:30 PM Medical Record Number: [id number] Patient Account Number: [id number] Date of Birth/Sex: Treating RN: 08-30-1970 (50 y.o. [name] Primary Care Provider: [name], [name] Other Clinician: Referring Provider: Treating Provider/Extender: Kalman Shan MO REIRA, [name] Weeks in Treatment: 103 Constitutional respirations regular, non-labored and within target range for patient.Marland Kitchen Psychiatric pleasant and cooperative. Notes Left plantar foot wound: Nonviable tissue on surface and once this was removed there was healthy granulation tissue underneath. No signs of infection. Third left toe: The  distal tip has an open wound and overall the toe is slightly inflamed with swelling. No purulent drainage. Electronic Signature(s) Signed: 08/25/2020 1:40:59 PM By: Kalman Shan DO Entered By: Kalman Shan on 08/25/2020 13:38:07 -------------------------------------------------------------------------------- Physician Orders Details Patient Name: Date of Service: [name] 08/25/2020 12:30 PM Medical Record Number:  [id number] Patient Account Number: [id number] Date of Birth/Sex: Treating RN: 20-Jan-1971 (50 y.o. [name], Meta.Reding Primary Care Provider: [name], [name] Other Clinician: Referring Provider: Treating Provider/Extender: Kalman Shan MO REIRA, [name] Weeks in Treatment: 79 Verbal / Phone Orders: No Diagnosis Coding ICD-10 Coding Code Description L97.514 Non-pressure chronic ulcer of other part of right foot with necrosis of bone E10.621 Type 1 diabetes mellitus with foot ulcer L97.521 Non-pressure chronic ulcer of other part of left foot limited to breakdown of skin L97.529 Non-pressure chronic ulcer of other part of left foot with unspecified severity Follow-up Appointments ppointment in 1 week. - Dr. Dellia Nims Return A Bathing/ Shower/ Hygiene May shower and wash wound with soap and water. Edema Control - Lymphedema / SCD / Other Bilateral Lower Extremities Elevate legs to the level of the heart or above for 30 minutes daily and/or when sitting, a frequency of: - throughout the day Avoid standing for long periods of time. Moisturize legs daily. - with dressing changes Off-Loading Open toe surgical shoe to: - to both feet Other: - minimal weight bearing right foot Wound Treatment Wound #50 - Foot Wound Laterality: Plantar, Left Cleanser: Wound Cleanser Every Other Day/30 Days Discharge Instructions: Cleanse the wound with wound cleanser or normal saline prior to applying a clean dressing using gauze sponges, not tissue or cotton balls. Prim Dressing: Hydrofera Blue Classic Foam, 2x2 in Every Other Day/30 Days ary Discharge Instructions: Moisten with saline prior to applying to wound bed Secondary Dressing: Woven Gauze Sponge, Non-Sterile 4x4 in Every Other Day/30 Days Discharge Instructions: Apply over primary dressing as directed. Secured With: Elastic Bandage 4 inch (ACE bandage) Every Other Day/30 Days Discharge Instructions: Secure with ACE bandage as directed. Secured  With: [employer], 4.5x3.1 (in/yd) Every Other Day/30 Days Discharge Instructions: Secure with Kerlix as directed. Secured With: Paper Tape, 2x10 (in/yd) Every Other Day/30 Days Discharge Instructions: Secure dressing with tape as directed. Wound #51 - T Second oe Wound Laterality: Left Cleanser: Soap and Water Every Other Day/15 Days Discharge Instructions: May shower and wash wound with dial antibacterial soap and water prior to dressing change. Prim Dressing: Promogran Prisma Matrix, 4.34 (sq in) (silver collagen) Every Other Day/15 Days ary Discharge Instructions: Moisten collagen with saline or hydrogel Secondary Dressing: Woven Gauze Sponges 2x2 in Every Other Day/15 Days Discharge Instructions: Apply over primary dressing as directed. Secured With: Child psychotherapist, Sterile 2x75 (in/in) Every Other Day/15 Days Discharge Instructions: Secure with stretch gauze as directed. Secured With: Paper Tape, 1x10 (in/yd) Every Other Day/15 Days Discharge Instructions: Secure dressing with tape as directed. Patient Medications llergies: Cleocin, lisinopril A Notifications Medication Indication Start End 08/25/2020 Keflex DOSE 1 - oral 500 mg capsule - 1 capsule twice daily for 5 days Electronic Signature(s) Signed: 08/25/2020 1:39:04 PM By: Kalman Shan DO Entered By: Kalman Shan on 08/25/2020 13:39:02 -------------------------------------------------------------------------------- Problem List Details Patient Name: Date of Service: [name] 08/25/2020 12:30 PM Medical Record Number: [id number] Patient Account Number: [id number] Date of Birth/Sex: Treating RN: Mar 26, 1971 (50 y.o. [name] Primary Care Provider: [name], [name] Other Clinician: Referring Provider: Treating  Provider/Extender: Kalman Shan MO REIRA, [name] Weeks in Treatment: 40 Active Problems ICD-10 Encounter Code Description Active Date  MDM Diagnosis L97.514 Non-pressure chronic ulcer of other part of right foot with necrosis of bone 09/03/2018 No Yes E10.621 Type 1 diabetes mellitus with foot ulcer 09/24/2018 No Yes L97.521 Non-pressure chronic ulcer of other part of left foot limited to breakdown of 06/24/2020 No Yes skin L97.529 Non-pressure chronic ulcer of other part of left foot with unspecified severity 07/29/2020 No Yes Inactive Problems ICD-10 Code Description Active Date Inactive Date M86.671 Other chronic osteomyelitis, right ankle and foot 09/03/2018 09/03/2018 L97.411 Non-pressure chronic ulcer of right heel and midfoot limited to breakdown of skin 09/17/2019 09/17/2019 L97.521 Non-pressure chronic ulcer of other part of left foot limited to breakdown of skin 04/20/2019 04/20/2019 L97.812 Non-pressure chronic ulcer of other part of right lower leg with fat layer exposed 02/24/2019 02/24/2019 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 08/25/2020 12:30 PM Medical Record Number: [id number] Patient Account Number: [id number] Date of Birth/Sex: Treating RN: May 31, 1970 (50 y.o. [name] Primary Care Provider: [name], [name] Other Clinician: Referring Provider: Treating Provider/Extender: Idelia Salm, [name] Weeks in Treatment: 103 Subjective Chief Complaint Information obtained from Patient Significant right foot ulcer with right leg ulcers 4/29: Right foot ulcer, left plantar ulcer and left second toe wound History of Present Illness (HPI) 50 year old  diabetic who is known to have type 1 diabetes which is poorly controlled last hemoglobin A1c was 11%. She comes in with a ulcerated area on the left lateral foot which has been there for over 6 months. Was recently she has been treated by [name] of podiatry who saw her last on 05/28/2016. Review of his notes revealed that the patient had incision and drainage with placement of antibiotic beads to the left foot on 04/11/2016 for possible osteomyelitis of the cuboid bone. Over the last year she's had a history of amputation of the left fifth toe and a femoropopliteal popliteal bypass graft somewhere in April 2017. 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 His note [name] mentions that the patient has been referred to me for further wound care and possibly great candidate for hyperbaric oxygen therapy due to recurrent osteomyelitis. However we do not have any x-rays of biopsy reports confirming this. He has been on several antibiotics including Bactrim and most recently is on doxycycline for an MRSA. I understand, the patient was not a candidate for IV antibiotics as she has had previous PICC lines which resulted in blood clots in both arms. There was a x-ray report dated 04/04/2016 on [name] notes which showed evidence of fifth ray resection left foot with osteolytic changes noted to the fourth metatarsal and cuboid bone on the left. 06/13/2016 -- had a left foot x-ray which showed no acute fracture or dislocation and no definite radiographic evidence of osteomyelitis. Advanced osteopenia was seen. 06/20/2016 -- she has noticed a new wound on the right plantar foot in the region where she had a callus before. 06/27/16- the patient did have her x-ray of the right foot which showed no findings to suggest osteomyelitis. She saw her endocrinologist, [name], yesterday.  Her A1c in January was 11. He also indicates mismanagement and noncompliance regarding her diabetes. She is  currently on Bactrim for a lip infection. She is complaining of nausea, vomiting and diarrhea. She is unable to articulate the exact orders or dosing of the Bactrim; it is unclear when she will complete this. 07/04/2016 -- results from [facility] of ABIs with ankle waveforms were noted from 02/14/2016. The examination done on 06/27/2015 showed noncompressible ABIs with the right being 1.45 and the left being 1.33. The present examination showed a right ABI of 1.19 on the left of 1.33. The conclusion was that right normal ABI in the lower extremity at rest however compared to previous study which was noncompressible ABI may be falsely elevated side suggesting medial calcification. The left ABI suggested medial calcification. 08/01/2016 -- the patient had more redness and pain on her right foot and did not get to come to see as noted she see her PCP or go to the ER and decided to take some leftover metronidazole which she had at home. As usual, the patient does report she feels and is rather noncompliant. 08/08/2016 -- -- x-ray of the right foot -- FINDINGS:Transmetatarsal amputation is noted. No bony destruction is noted to suggest osteomyelitis. IMPRESSION: No evidence of osteomyelitis. Postsurgical changes are seen. MRI would be more sensitive for possible bony changes. Culture has grown Serratia Marcescens -- sensitive to Bactrim, ciprofloxacin, ceftazidime she was seen by [name] on 08/06/2016. He did not find any exposed bone, muscle, tendon, ligament or joint. There was no malodor and he did a excisional debridement in the office. ============ Old notes: 50 year old patient who is known to the wound clinic for a while had been away from the wound clinic since 09/01/2014. Over the last several months she has been admitted to various hospitals including [facility] [place]. She was treated for a right metatarsal osteomyelitis with a transmetatarsal amputation and this was done  about 2 months ago. He has a small ulcerated area on the right heel and she continues to have an ulcerated area on the left plantar aspect of the foot. The patient was recently admitted to the [facility] hospital group between 7/12 and 10/18/2014. she was given 3 weeks of IV vancomycin and was to follow-up with her surgeons at [facility] and also took oral vancomycin for C. difficile colitis. 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 MRI of the left foot done on 10/14/2014 showed no abscess or osteomyelitis. 04/27/15; this is a patient we know from previous stays in the wound care center. She is a type I diabetic I am not sure of her control currently. Since the last time I saw her she is had a right transmetatarsal amputation and has no wounds on her right foot and has no open wounds. She is been followed at the wound care center at [facility] in [place]. She comes today with the desire to undergo hyperbaric treatment locally. Apparently one of her wound care providers in [place] has suggested hyperbarics. This is in response to an MRI from 04/18/15 that showed increased marrow signal and loss of the proximal fifth metatarsal cortex evidence of osteomyelitis with likely early osteomyelitis in the cuboid bone as well. She has a large wound over the base of the  fifth metatarsal. She also has a eschar over her the tips of her toes on 1,3 and 5. She does not have peripheral pulses and apparently is going for an angiogram tomorrow which seems reasonable. After this she is going to infectious disease at [facility]. They have been using Medihoney to the large wound on the lateral aspect of the left foot to. The patient  has known Charcot deformity from diabetic neuropathy. She also has known diabetic PAD. Surprisingly I can't see that she has had any recent antibiotics, the patient states the last antibiotic she had was at the end of November for 10 days. I think this was in response to culture that showed group G strep although I'm not exactly sure where the culture was from. She is also had arterial studies on 03/29/15. This showed a right ABI of 1.4 that was noncompressible. Her left ABI was 0.73. 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 It was not felt that arterial inflow was adequate for healing of a foot ulcer. Her Doppler waveforms looked monophasic ===== READMISSION 02/28/17; this is in an now 50 year old woman we've had at several different occasions in this clinic. 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 She has a remote ex-smoker. She was last seen in this clinic by Dr. Con Memos I think in May. More recently she is been followed by her podiatrist [name] an infectious disease Dr. Megan Salon. She has 2 open wounds the major one is over the right first metatarsal head she also has a wound on the left plantar foot. an MRI of the right foot on 01/01/17 showed a soft tissue ulcer along the plantar aspect of the first metatarsal base consistent with osteomyelitis of the first metatarsal stump. Dr. 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 According to the patient this is been open for slightly over a month. She has been on a combination of Cipro 500 twice a day, Zyvox 600 twice a day and Flagyl 500 3 times a day for over a month now as directed by Dr. Megan Salon. cultures of the right foot earlier this year showed MRSA in January and Serratia in May. January also had a few viridans strep. Recent x-rays of both feet were done and [name] office and I don't have these  reports. The patient has known PAD and has a history of aleft femoropopliteal bypass in April 2017. 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 Dwyane Dee. In spite of this the last hemoglobin A1c I can see is 10.1 on 01/01/2017. She is being referred by 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 She is been using Medihoney to this area and also an area on the left midfoot. She is using healing sandals bilaterally. ABIs in this clinic at the left posterior tibial was 1.1 noncompressible on the right READMISSION Non invasive vascular NOVANT 5/18 Aftercare following surgery of the circulatory system Procedure Note - Interface, External Ris In - 08/13/2016 11:05 AM EDT Procedure: Examination consists of physiologic resting arterial pressures of the brachial and ankle arteries bilaterally with continuous wave Doppler waveform analysis. Previous: Previous exam performed on 02/14/16 demonstrated ABIs of Rt = 1.19 and  Lt = 1.33. Right: ABI = non-compressible PT 1.47 DP. S/P transmet amputation. , Left: ABI = 1.52, 2nd digit pressure = 87 mmHg Conclusions: Right: ABI (>1.3) may be falsely elevated, suggesting medial calcification. Left: ABI (>1.3) may be falsely elevated, suggesting medial calcification The patient is a now 50 year old type I diabetic is had multiple issues her graded to chronic diabetic foot ulcers. 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 We had her in the clinic lastin November. At that point she had wounds on her bilateral feet.she had wanted to try hyperbarics however the healogics review process denied her because she hadn't followed up with her vascular  surgeon for her left femoropopliteal bypass. The bypass was done by Dr. Raul Del at [facility]. We made her a follow-up with Dr. Raul Del however she did not keep the appointment and therefore she was not approved The patient shows me a small wound on her left fourth metatarsal head on her phone. She developed rapid discoloration in the plantar aspect of the left foot and she was admitted to hospital from 2/2 through 05/10/17 with wet gangrene of the left foot osteomyelitis of the fourth metatarsal heads. She was admitted acutely ill with a temperature of 103. She was started on broad-spectrum vancomycin and cefepime. On 05/06/17 she was taken to the OR by [name] her podiatric surgeon for an incision and drainage irrigation of the left foot wound. Cultures from this surgery revealed group be strep and anaerobes. she was seen by Dr.Xu of orthopedic surgery and scheduled for a below-knee amputation which she u refused. Ultimately she was discharged on Levaquin and Flagyl for one month. MRI 05/05/17 done while she was in the hospital showed abscess adjacent to the fourth metatarsal head and neck small abscess around the fourth flexor tendon. Inflammatory phlegmon and gas in the soft tissues along the lateral aspect of the fourth phalanx. Findings worrisome for osteomyelitis involving the fourth proximal and middle phalanx and also the third and fourth metatarsals. Finally the patient had actually shortly before this followed up with Dr. Raul Del at no time on 04/29/17. He felt that her left femoropopliteal bypass was patent he felt that her left-sided toe pressures more than adequate for healing a wound on the left foot. This was before her acute presentation. 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 05/28/17; she is started hyperbarics. The patient tells me that for some reason she was not actually on Levaquin but I think on ciprofloxacin. She was on Flagyl. She only started her Levaquin yesterday due to  some difficulty with the pharmacy and perhaps her sister picking it up. She has an appointment with [name] tomorrow and with infectious disease early next week. She has no new complaints 06/06/17; the patient continues in hyperbarics. She saw [name] on 05/29/17 who is her podiatric surgeon. He is elected for a transmetatarsal amputation on 06/27/17. I'm not sure at what level he plans to do this amputation. The patient is unaware ooShe also saw Dr. Megan Salon of infectious disease who elected to continue her on current antibiotics I think this is ciprofloxacin and Flagyl. I'll need to clarify with her tomorrow if she actually has this. We're using silver alginate to the actual wound. Necrotic surface today with material under the flap of her foot. ooOriginal MRI showed abscesses as well as osteomyelitis of the proximal and middle fourth phalanx and the third and fourth metatarsal heads 06/11/17; patient continues in hyperbarics and  continues on oral antibiotics. She is doing well. The wound looks better. The necrotic part of this under the flap in her superior foot also looks better. she is been to see [name]. I haven't had a chance to look at his note. Apparently he has put the transmetatarsal amputation on hold her request it is still planning to take her to the OR for debridement and product application ACEL. I'll see if I can find his note. I'll therefore leave product ordering/requests to [name] for now. I was going to look at Dermagraft 06/18/17-she is here in follow-up evaluation for bilateral foot wounds. She continues with hyperbaric therapy. She states she has been applying manuka honey to the right plantar foot and alternate manuka honey and silver alginate to the left foot, despite our orders. We will continue with same treatment plan and she will follo up next week. 06/25/17; I have reviewed [name] last note from 3/11. She has operative debridement in 2 days' time. By review his  note apparently they're going to place there is skin over the majority of this wound which is a good choice. She has a small satellite area at the most proximal part of this wound on the left plantar foot. The area on the right plantar foot we've been using silver alginate and it is close to healing. 07/02/17; unfortunately the patient was not easily approved for [name] proposed surgery. I'm not completely certain what the issue is. She has been using silver alginate to the wound she has completed a first course of hyperbarics. She is still on Levaquin and Flagyl. I have really lost track of the time course here.I suspect she should have another week to 2 of antibiotics. I'll need to see if she is followed up with infectious disease Dr. Megan Salon 07/09/17; the patient is followed up with Dr. Megan Salon. 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 She continues on Levaquin and metronidazole continuing both of these for now I think she is been on fr about 6 weeks. She still has some drainage but no pain. No fever. Her had been plans for her to go to the OR for operative debridement with her podiatrist [name], I am not exactly sure where that is. I'll probably slip a note to [name] today. I note that she follows with Dr. Dwyane Dee of endocrinology. We have her recertified for hyperbaric oxygen. I have not heard about Dermagraft however I'll see if [name] is planning a skin substitute as well 07/16/17; the patient tells me she is just about out of [place]. I'll need to check Dr. Hale Bogus last notes on this. She states she has plenty of Flagyl however. She comes in today complaining of pain in the right lateral foot which she said lasted for about a day. The wound on the right foot is actually much more medially. She also tells me that the [facility] cost a lot of pain in the left foot wound and she turned back to silver alginate. Finally Dermagraft has a  $761 per application co-pay. She cannot afford this 07/23/17; patient arrives today with the wound not much smaller. There is not much new to add. She has not heard from [name] all try to put in a call to them today. She was asking about Dermagraft again and she has an over $950 per application co-pay she states that she would be willing to try to do a payment  plan. I been tried to avoid this. We've been using silver alginate, I'll change to [facility] 07/30/17-She is here in follow-up evaluation for left foot ulcer. She continues hyperbaric medicine. The left foot ulcer is stable we will continue with same treatment plan 08/06/17; she is here for evaluation of her left foot ulcer. Currently being treated for hyperbarics or underlying osteomyelitis. She is completed antibiotics. The left foot ulcer is better smaller with healthier looking granulation. For various reasons I am not really clear on we never got her back to the OR with [name]. He did not respond to my secure text message. Nevertheless I think that surgery on this point is not necessary nor am I completely clear that a skin substitute is necessary The patient is complaining about pain on the outside of her right foot. 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 There is no erythema. She also states the foot is warm versus her other part of her upper leg and this is largely true. It is not totally clear to me what's causing this. She thinks it's different from her usual neuropathy pain 08/13/17; she arrives in clinic today with a small wound which is superficial on her right first metatarsal head. 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 She tells [place] she was up on her feet over the Mother's Day celebration. ooThe large wound is on the left foot. Continues with hyperbarics for underlying osteomyelitis. We're using Hydrofera Blue. She asked me today about where we were with Dermagraft. I had actually  excluded this because of the co-pay however she wants to assume this therefore I'll recheck the co-pay an order for next week. 08/20/17; the patient agreed to accept the co-pay of the first Apligraf which we applied today. She is disappointed she is finishing hyperbarics will run this through the insurance on the extent of the foot infection and the extent of the wound that she had however she is already had 60 dive's. Dermagraft No. 1 08/27/17; Dermagraft No. 2. She is not eligible for any more hyperbaric treatments this month. She reports a fair amount of drainage and she actually changed to the external dressings without disturbing the direct contact layer 09/03/17; the patient arrived in clinic today with the wound superficially looking quite healthy. Nice vibrant red tissue with some advancing epithelialization although not as much adherence of the flap as I might like. However she noted on her own fourth toe some bogginess and she brought that to our attention. Indeed this was boggy feeling like a possibility of subcutaneous fluid. 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 She is not been unwell. We've been using Dermagraft 09/10/17; the culture that I did not last week was MRSA. She saw Dr. Megan Salon this morning who is going to start her on vancomycin. I had sent him a secure a text message yesterday. I also spoke with her podiatric surgeon [name] about surgery on this foot the options for conserving a functional foot etc. Promised me he would see her and will make back consultation today. Paradoxically her actual wound on the plantar aspect of her left foot looks really quite good. I had given her 5 days worth of Baxdella to cover her for MRSA. Her MRI came back showing osteomyelitis within the third metatarsal shaft and head and base of the third and fourth proximal phalanx. She had extensive inflammatory changes  throughout the soft tissue of the lateral forefoot. With an ill-defined fluid around the fourth metatarsal extending into the plantar and dorsal soft tissues 09/19/17; the patient is actually on oral Septra and Flagyl. She apparently refused IV vancomycin. She also saw [name] at my request who is planning her for a left BKA sometime in mid July. MRI showed osteomyelitis within the third metatarsal shaft and head and the basis of the third and fourth proximal phalanx. I believe there was felt to be possible septic arthritis involving the third MTP. 09/26/17; the patient went back to Dr. Megan Salon at my suggestion and is now receiving IV daptomycin. Her wound continues to look quite good making the decision to proceed with a transmetatarsal amputation although more difficult for the patient. I believe in my extensive discussions with her she has a good sense of the pros and cons of this. I don't NV the tuft decision she has to make. She has an appointment with [name] I believe in mid July and I previously spoken to him about this issue Has we had used 3 previous Dermagraft. Given the condition of the wound surface I went ahead and added the fourth one today area and I did this not fully realizing that she'll be traveling to [place] next week. I'm hopeful she can come back in 2 weeks 10/21/17; Her same Dermagraft on for about 3-1/2 weeks. In spite of this the wound arrives looking quite healthy. There is been a lot of healing dimensions are smaller. Looking at the square shaped wound she has now there is some undermining and some depth medially under the undermining although I cannot palpate any bone. No surrounding infection is obvious. She has difficult questions about how to look at this going forward vis--vis amputations versus continued medical therapy. T be truthful the wound is looks so o healthy and it is continued to contract. Hard to justify foot surgery at this point although I still told  her that I think it might come to that if we are not able to eradicate the underlying MRSA. She is still highly at risk and she understands this 11/06/17 on evaluation today patient appears to be doing better in regard to her foot ulcer. She's been tolerating the dressing changes without complication. Currently she is here for her Dermagraft #6. Her wound continues to make excellent progress at this point. She does not appear to have any evidence of infection which is good news. 11/13/17 on evaluation today patient appears to be doing excellent at this time. She is here for repeat Dermagraft application. This is #7. Overall her wound seems to be making great progress. 12/05/17; the patient arrives with the wound in much better condition than when I last saw this almost 6 weeks ago. She still has a small probing area in the left metatarsal head region on the lateral aspect of her foot. We applied her last Dermagraft today. ooSince the last time she is here she has what appears to a been a blood blister on the plantar aspect of left foot although I don't see this is threatening. There is also a thick raised tissue on the right mid metatarsal head region. This was not there I don't think the last time she was here 3 weeks ago. 12/12/17; the patient continues to have a small programming area in the left metatarsal head region on the lateral aspect of her foot which was the initial large surgical wound. I applied her last Apligraf last week.  I'm going to use Endoform starting today ooUnfortunately she has an excoriated area in the left mid foot and the right mid foot. The left midfoot looks like a blistered area this was not opened last week it certainly is open today. Using silver alginate on these areas. She promises me she is offloading this. 12/19/17; the small probing area in the left metatarsal head eyes think is shallower. In general her original wound looks better. We've been using Endoform. The area  inferiorly that I think was trauma last week still requires debridement a lot of nonviable surface which I removed. She still has an open open area distally in her foot ooSimilarly on the right foot there is tightly adherent surface debris which I removed. Still areas that don't look completely epithelialized. This is a small open area. We used silver alginate on these areas 12/26/2017; the patient did not have the supplies we ordered from last week including the Endoform. The original large wound on the left lateral foot looks healthy. She still has the undermining area that is largely unchanged from last week. She has the same heavily callused raised edged wounds on the right mid and left midfoot. Both of these requiring debridement. We have been using silver alginate on these areas 01/02/2018; there is still supply issues. We are going to try to use Prisma but I am not sure she actually got it from what she is saying. She has a new open area on the lateral aspect of the left fourth toe [previous fifth ray amputation]. Still the one tunneling area over the fourth metatarsal head. The area is in the midfoot bilaterally still have thick callus around them. She is concerned about a raised swelling on the lateral aspect of the foot. However she is completely insensate 01/10/2018; we are using Prisma to the wounds on her bilateral feet. Surprisingly the tunneling area over the left fourth metatarsal head that was part of her original surgery has closed down. She has a small open area remaining on the incision line. 2 open areas in the midfoot. 02/10/2018; the patient arrives back in clinic after a month hiatus. She was traveling to visit family in [place]. Is fairly clear she was not offloading the areas on her feet. The original wound over the left lateral foot at the level of metatarsal heads is reopened and probes medially by about a centimeter or 2. She notes that a week ago she had purulent drainage  come out of an area on the left midfoot. Paradoxically the worst area is actually on the right foot is extensive with purulent drainage. We will use silver alginate today 02/17/2018; the patient has 3 wounds one over the left lateral foot. She still has a small area over the metatarsal heads which is the remnant of her original surgical wound. This has medial probing depth of roughly 1.4 cm somewhat better than last week. The area on the right foot is larger. We have been using silver alginate to all areas. The area on the right foot and left foot that we cultured last week showed both Klebsiella and Proteus. Both of these are quinolone sensitive. The patient put her's self on Bactrim and Flagyl that she had left hanging around from prior antibiotic usages. She was apparently on this last week when she arrived. I did not realize this. Unfortunately the Bactrim will not cover either 1 of these organisms. We will send in Cipro 500 twice daily for a week 03/04/2018; the patient has 2  wounds on the left foot one is the original wound which was a surgical wound for a deep DFU. At one point this had exposed bone. She still has an area over the fourth metatarsal head that probes about 1.4 cm although I think this is better than last week. I been using silver nitrate to try and promote tissue adherence and been using silver alginate here. ooShe also has an area in the left midfoot. This has some depth but a small linear wound. Still requiring debridement. ooOn the right midfoot is a circular wound. A lot of thick callus around this area. ooWe have been using silver alginate to all wound areas ooShe is completed the ciprofloxacin I gave her 2 weeks ago. 03/11/2018; the patient continues to have 2 open areas on the left foot 1 of which was the original surgical wound for a deep DFU. Only a small probing area remains although this is not much different from last week we have been using silver alginate. The  other area is on the midfoot this is smaller linear but still with some depth. We have been using silver alginate here as well ooOn the right foot she has a small circular wound in the mid aspect. This is not much smaller than last time. We have been using silver alginate here as well 03/18/2018; she has 3 wounds on the left foot the original surgical wound, a very superficial wound in the mid aspect and then finally the area in the mid plantar foot. She arrives in today with a very concerning area in the wound in the mid plantar foot which is her most proximal wound. There is undermining here of roughly 1-1/2 cm superiorly. Serosanguineous drainage. She tells me she had some pain on for over the weekend that shot up her foot into her thigh and she tells me that she had a nodule in the groin area. ooShe has the single wound in the right foot. ooWe are using endoform to both wound areas 03/24/2018; the patient arrives with the original surgical wound in the area on the left midfoot about the same as last week. There is a collection of fluid under the surface of the skin extending from the surgical wound towards the midfoot although it does not reach the midfoot wound. The area on the right foot is about the same. Cultures from last week of the left midfoot wound showed abundant Klebsiella abundant Enterococcus faecalis and moderate methicillin resistant staph I gave her Levaquin but this would have only covered the Klebsiella. She will need linezolid 04/01/2018; she is taking linezolid but for the first few days only took 1 a day. I have advised her to finish this at twice daily dosing. In any case all of her wounds are a lot better especially on the left foot. The original surgical wound is closed. The area on the left midfoot considerably smaller. The area on the right foot also smaller. 04/08/2018; her original surgical wound/osteomyelitis on the left foot remains closed. She has area on the left  foot that is in the midfoot area but she had some streaking towards this. This is not connected with her original wound at least not visually. ooSmall wound on the right midfoot appears somewhat smaller. 04/15/18; both wounds looks better. Original wound is better left midfoot. Using silver alginate 1/21; patient states she uses saltwater soak in, stones or remove callus from around her wounds. She is also concerned about a blood blister she had on the left  foot but it simply resolved on its own. We've been using silver alginate 1/28; the patient arrives today with the same streaking area from her metatarsals laterally [the site of her original surgical wound] down to the middle of her foot. There is some drainage in the subcutaneous area here. This concerns me that there is actually continued ongoing infection in the metatarsals probably the fourth and third. This fixates an MRI of the foot without contrast [chronic renal failure] ooThe wound in the mid part of the foot is small but I wonder whether this area actually connects with the more distal foot. ooThe area on the right midfoot is probably about the same. Callus thick skin around the small wound which I removed with a curette we have been using silver alginate on both wound areas 2/4; culture I did of the draining site on the left foot last time grew methicillin sensitive staph aureus. MRI of the left foot showed interval resolution of the findings surrounding the third metatarsal joint on the prior study consistent with treated osteomyelitis. Chronic soft tissue ulceration in the plantar and lateral aspect of the forefoot without residual focal fluid collection. No evidence of recurrent osteomyelitis. 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 She tells me she had nausea and vomiting over the weekend and missed 2 days. In general her foot looks much the same. She has a small open area just below the left fourth metatarsal head. A linear area in the left midfoot. Some discoloration extending from the inferior part of this into the left lateral foot although this appears to be superficial. She has a small area on the right midfoot which generally looks smaller after debridement 2/18; the patient is completing his cephalexin and has another 2 days. She continues to have open areas on the left and right foot. 2/25; she is now off antibiotics. The area on the left foot at the site of her original surgical wound has closed yet again. She still has open areas in the mid part of her foot however these appear smaller. The area on the right mid foot looks about the same. We have been using silver alginate She tells me she had a serious hypoglycemic spell at home. She had to have EMS called and get IV dextrose 3/3; disappointing on the left lateral foot large area of necrotic tissue surrounding the linear area. This appears to track up towards the same original surgical wound. Required extensive debridement. The area on the right plantar foot is not a lot better also using silver 3/12; the culture I did last time showed abundant enterococcus. I have prescribed Augmentin, should cover any unrecognized anaerobes as well. In addition there were a few MRSA and Serratia that would not be well covered although I did not want to give her multiple antibiotics. She comes in today with a new wound in the right midfoot this is not connected with the original wound over her MTP a lot of thick callus tissue around both wounds but once again she said she is not walking on these areas 3/17-Patient comes in for follow-up on the bilateral plantar wounds, the right midfoot and the left plantar wound. Both these are  heavily callused surrounding the wounds. We are continuing to  use silver alginate, she is compliant with offloading and states she uses a wheelchair fairly often at home 3/24; both wound areas have thick callus. However things actually look quite a bit better here for the majority of her left foot and the right foot. 3/31; patient continues to have thick callused somewhat irritated looking tissue around the wounds which individually are fairly superficial. There is no evidence of surrounding infection. We have been using silver alginate however I change that to [facility] today 4/17; patient returns to clinic after having a scare with Covid she tested negative in her primary doctor's office. She has been using Hydrofera Blue. She does not have an open area on the right foot. On the left foot she has a small open area with the mid area not completely viable. She showed me pictures of what looks like a hemorrhagic blister from several days ago but that seems to have healed over this was on the lateral left foot 4/21; patient comes in to clinic with both her wounds on her feet closed. However over the weekend she started having pain in her right foot and leg up into the thigh. She felt as though she was running a low-grade fever but did not take her temperature. She took a doxycycline that she had leftover and yesterday a single Septra and metronidazole. She thinks things feel somewhat better. 4/28; duplex ultrasound I ordered last week was negative for DVT or superficial thrombophlebitis. She is completed the doxycycline I gave her. States she is still having a lot of pain in the right calf and right ankle which is no better than last week. She cannot sleep. She also states she has a temperature of up to 101, coughing and complaining of visual loss in her bilateral eyes. Apparently she was tested for Covid 2 weeks ago at [facility] and that was negative. Readmission: 09/03/18 patient presents back for  reevaluation after having been evaluated at the end of April regarding erythema and swelling of her right lower extremity. Subsequently she ended up going to the hospital on 07/29/18 and was admitted not to be discharged until 08/08/18. Unfortunately it was noted during the time that she was in the hospital that she did have methicillin-resistant Staphylococcus aureus as the infection noted at the site. It was also determined that she did have osteomyelitis which appears to be fairly significant. She was treated with vancomycin and in fact is still on IV vancomycin at dialysis currently. This is actually slated to continue until 09/12/18 at least which will be the completion of the six weeks of therapy. Nonetheless based on what I'm seeing at this point I'm not sure she will be anywhere near ready to discontinue antibiotics at that time. Since she was released from the hospital she was seen by [name] who is her podiatrist on 08/27/18. His note specifically states that he is recommended that the patient needs of one knee amputation on the right as she has a life- threatening situation that can lead quickly to sepsis. The patient advised she would like to try to save her leg to which [name] apparently told her that this was against all medical advice. She also want to discontinue the Wound VAC which had been initiated due to the fact that she wasn't pleased with how the wound was looking and subsequently she wanted to pursue applying Medihoney at that time. He stated that he did not believe that the right lower extremity was salvageable and that the patient  understood but would still like to attempt hyperbaric option therapy if it could be of any benefit. She was therefore referred back to [place] for further evaluation. He plans to see her back next week. Upon inspection today patient has a significant amount purulent drainage noted from the wound at this point. The bone in the distal portion of her foot also  appears to be extremely necrotic and spongy. When I push down on the bone it bubbles and seeps purulent drainage from deeper in the end of the foot. I do not think that this is likely going to heal very well at all and less aggressive surgical debridement were undertaken more than what I believe we can likely do here in our office. 09/12/2018; I have not seen this patient since the most recent hospitalization although she was in our clinic last week. I have reviewed some of her records from a complex hospitalization. 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 There is situation was complicated by MRSA bacteremia and acute on chronic renal failure now on dialysis. She is receiving vancomycin at dialysis. We started her on Dakin's wet-to-dry last week she is changing this daily. There is still purulent drainage coming out of her foot. Although she is apparently "agreeable" to a below-knee amputation which is been suggested by multiple clinicians she wants this to be done in [place]. She apparently has a telehealth visit with that provider sometime in late [place] 6/24. I have told her I think this is probably too long. Nevertheless I could not convince her to allow a local doctor to perform BKA. 09/19/2018; the patient has a large necrotic area on the right anterior foot. She has had previous transmetatarsal amputations. Culture I did last week showed MRSA nothing else she is on vancomycin at dialysis. She has continued leaking purulent drainage out of the distal part of the large circular wound on the right anterior foot. She apparently went to see Dr. 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 Somehow that translated into her being referred to plastic surgery for debridement of the area. I gather she basically refused amputation although I do not have a copy of Dr. Berenice Primas notes. The patient really wants to have a trial of  hyperbaric oxygen. I agreed with initial assessment in this clinic that this was probably too far along to benefit however if she is going to have plastic surgery I think she would benefit from ancillary hyperbaric oxygen. The issue here is that the patient has benefited as maximally as any patient I have ever seen from hyperbaric oxygen therapy. Most recently she had exposed bone on the lateral part of her left foot after a surgical procedure and that actually has closed. She has eschared areas in both heels but no open area. She is remained systemically well. I am not optimistic that anything can be done about this but the patient is very clear that she wants an attempt. The attempt would include a wound VAC further debridements and hyperbaric oxygen along with IV antibiotics. 6/26; I put her in for a trial of hyperbaric oxygen only because of the dramatic response she has had with wounds on her left midfoot earlier this year which was a surgical wound that went straight to her bone over the metatarsal heads and also remotely the left third toe. We will see if we can get this through our review process and insurance. She arrives in clinic with  again purulent material pouring out of necrotic bone on the top of the foot distally. There is also some concerning erythema on the front of the leg that we marked. It is bit difficult to tell how tender this is because of neuropathy. I note from infectious disease that she had her vancomycin extended. 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 She had the wound VAC on for part of the week. The rest of the time she is putting various things on this including Medihoney, "ionized water" silver sorb gel etc. 7/7; follow-up along with HBO. She is still on vancomycin at dialysis. She has a large open area on the dorsal right foot and a small dark eschar area on her heel. There is a lot less erythema in the area and a lot less  tenderness. From an infection point of view I think this is better. She still has a lot of necrosis in the remaining right forefoot [previous TMA] we are still using the wound VAC in this area 7/16; follow-up along with HBO. I put her on linezolid after she finished her vancomycin. We started this last Friday I gave her 2 weeks worth. I had the expectation that she would be operatively debrided by Dr. Marla Roe but that still has not happened yet. Patient phoned the office this week. She arrives for review today after HBO. The distal part of this wound is completely necrotic. Nonviable pieces of tendon bone was still purulent drainage. Also concerning that she has black eschar over the heel that is expanding. I think this may be indicative of infection in this area as well. She has less erythema and warmth in the ankle and calf but still an abnormal exam 7/21 follow-up along with HBO. I will renew her linezolid after checking a CBC with differential monitoring her blood counts especially her platelets. She was supposed to have surgery yesterday but if I am reading things correctly this was canceled after her blood sugar was found to be over 500. I thought Dr. Marla Roe who called me said that they were sending her to the ER but the patient states that was not the case. 7/28. Follow-up along with HBO. She is on linezolid I still do not have any lab work from dialysis even though I called last week. The patient is concerned about an area on her left lateral foot about the level of the base of her fifth metatarsal. I did not really see anything that ominous here however this patient is in [place] ability to point out problems that she is sensing and she has been accurate in the past Finally she received a call from Dr. Marla Roe who is referring her to another orthopedic surgeon stating that she is too booked up to take her to the operating room now. Was still using a wound VAC on the foot 8/3  -Follow-up after HBO, she is got another week of linezolid, she is to call ID for an appointment, x-rays of both feet were reviewed, the left foot x-ray with third MTP joint osteo- Right foot x-ray widespread osteo-in the right midfoot Right ankle x-ray does not show any active evidence of infection 8/11-Patient is seen after HBO, the wounds on the right foot appear to be about the same, the heel wound had some necrotic base over tendon that was debrided with a curette 8/21; patient is seen after HBO. The patient's wound on her dorsal foot actually looks reasonably good and there is substantial amount  of epithelialization however the open area distally still has a lot of necrotic debris partially bone. I cannot really get a good sense of just how deep this probes under the foot. She has been pressuring me this week to order medical maggots through a company in [place] for her. The problem I have is there is not a defined wound area here. On the positive side there is no purulence. She has been to see infectious disease she is still on Septra DS although I have not had a chance to review their notes 8/28; patient is seen in conjunction with HBO. The wounds on her foot continued to improve including the right dorsal foot substantially the, the distal part of this wound and the area on the right heel. We have been using a wound VAC over this chronically. She is still on trimethoprim as directed by infectious disease 9/4; patient is seen in conjunction with HBO. Right dorsal foot wound substantially anteriorly is better however she continues to have a deep wound in the distal part of this that is not responding. We have been using silver collagen under border foam ooArea on the right plantar medial heel seems better. We have been using Hydrofera Blue 12/12/18 on evaluation today patient appears to be doing about the same with regard to her wound based on prior measurements. She does have some  necrotic tissue noted on the lateral aspect of the wound that is going require a little bit of sharp debridement today. This includes what appears to be potentially either severely necrotic bone or tendon. Nonetheless other than that she does not appear to have any severe infection which is good news 9/18; it is been 2 weeks since I saw this wound. She is tolerating HBO well. Continued dramatic improvement in the area on the right dorsal foot. She still has a small wound on the heel that we have been using Hydrofera Blue. She continues with a wound VAC 9/24; patient has to be seen emergently today with a swelling on her right lateral lower leg. She says that she told Dr. Evette Doffing about this and also myself on a couple of occasions but I really have no recollection of this. She is not systemically unwell and her wound really looked good the last time I saw this. She showed this to providers at dialysis and she was able to verify that she was started on cephalexin today for 5 doses at dialysis. She dialyzes on Tuesday Thursday and Saturday. 10/2; patient is seen in conjunction with HBO. The area that is draining on the right anterior medial tibia is more extensive. Copious amounts of serosanguineous drainage with some purulence. We are still using the wound VAC on the original wound then it is stable. Culture I did of the original IandD showed MRSA I contacted dialysis she is now on vancomycin with dialysis treatments. I asked them to run a month 10/9; patient seen in conjunction with HBO. She had a new spontaneous open area just above the wound on the right medial tibia ankle. More swelling on the right medial tibia. Her wound on the foot looks about the same perhaps slightly better. There is no warmth spreading up her leg but no obvious erythema. her MRI of the foot and ankle and distal tib-fib is not booked for next Friday I discussed this with her in great detail over multiple days. it is likely she  has spreading infection upper leg at least involving the distal 25% above the ankle. She  knows that if I refer her to orthopedics for infectious disease they are going to recommend amputation and indeed I am not against this myself. We had a good trial at trying to heal the foot which is what she wanted along with antibiotics debridement and HBO however she clearly has spreading infection [probably staph aureus/MRSA]. Nevertheless she once again tells me she wants to wait the left of the MRI. She still makes comments about having her amputation done in [place]. 10/19; arrives today with significant swelling on the lateral right leg. Last culture I did showed Klebsiella. Multidrug-resistant. Cipro was intermediate sensitivity and that is what I have her on pending her MRI which apparently is going to be done on Thursday this week although this seems to be moving back and forth. She is not systemically unwell. We are using silver alginate on her major wound area on the right medial foot and the draining areas on the right lateral lower leg 10/26; MRI showed extensive abscess in the anterior compartment of the right leg also widespread osteomyelitis involving osseous structures of the midfoot and portions of the hindfoot. Also suspicion for osteomyelitis anterior aspect of the distal medial malleolus. Culture I did of the purulence once again showed a multidrug-resistant Klebsiella. I have been in contact with nephrology late last week and she has been started on cefepime at dialysis to replace the vancomycin We sent a copy of her MRI report to Dr. Geroge Baseman in [place] who is an orthopedic surgeon. The patient takes great stock in his opinion on this. She says she will go to [place] to have her leg amputated if Dr. Geroge Baseman does not feel there is any salvage options. 11/2; she still is not talk to her orthopedic surgeon in [place]. Apparently he will call her at [phone] this afternoon. The quality  of this is she has not allowed me to refer her anywhere. She has been told over and over that she needs this amputated but has not agreed to be referred. She tells me her blood sugar was 600 last night but she has not been febrile. 11/9; she never did got a call from the orthopedic surgeon in [place] therefore that is off the radar. We have arranged to get her see orthopedic surgery at [facility]. She still has a lot of draining purulence coming out of the new abscess in her right leg although that probably came from the osteomyelitis in her right foot and heel. Meanwhile the original wound on the right foot looks very healthy. Continued improvement. The issue is that the last MRI showed osteomyelitis in her right foot extensively she now has an abscess in the right anterior lower leg. There is nobody in [place] who will offer this woman anything but an amputation and to be honest that is probably what she needs. I think she still wants to talk about limb salvage although at this point I just do not see that. She has completed her vancomycin at dialysis which was for the original staph aureus she is still on cefepime for the more recent Klebsiella. She has had a long course of both of these antibiotics which should have benefited the osteomyelitis on the right foot as well as the abscess. 11/16; apparently [place] elective surgery is shut down because of COVID-19 pandemic. I have reached out to some contacts at [facility] to see if we can get her an orthopedic appointment there. I am concerned about continually leaving this but for the moment everything is static. In  fact her original large wound on this foot is closing down. It is the abscess on the right anterior leg that continues to drain purulent serosanguineous material. She is not currently on any antibiotics however she had a prolonged course of vancomycin [1 month] as well as cefepime for a month 02/24/2019 on evaluation today patient  appears to be doing better than the last time I saw her. This is not a patient that I typically see. With that being said I am covering for Dr. Dellia Nims this week and again compared to when I last saw her overall the wounds in particular seem to be doing significantly better which is good news. With that being said the patient tells me several disconcerting things. She has not been able to get in to see anyone for potential debridement in regard to her leg wounds although she tells me that she does not think it is necessary any longer because she is taking care of that herself. She noticed a string coming out of the lower wound on her leg over the last week. The patient states that she subsequently decided that we must of pack something in there and started pulling the string out and as it kept coming and coming she realized this was likely her tendon. With that being said she continued to remove as much of this as she could. She then I subsequently proceeded to using tubes of antibiotic ointment which she will stick down into the wound and then scored as much as she can until she sees it coming out of the other wound opening. She states that in doing this she is actually made things better and there is less redness and irritation. With regard to her foot wound she does have some necrotic tendon and tissue noted in one small corner but again the actual wound itself seems to be doing better with good granulation in general compared to my last evaluation. 12/7; continued improvement in the wound on the substantial part of the right medial foot. Still a necrotic area inferiorly that required debridement but the rest of this looks very healthy and is contracting. She has 2 wounds on the right lateral leg which were her original drainage sites from her abscess but all of this looks a lot better as well. She has been using silver alginate after putting antibiotic biotic ointment in one wound and watching it come  out the other. I have talked to her in some detail today. I had given her names of orthopedic surgeons at [facility] for second opinion on what to do about the right leg. I do not think the patient never called them. She has not been able to get a hold of the orthopedic surgeon in [place] that she had put a lot of faith in as being somebody would give her an opinion that she would trust. I talked to her today and said even if I could get her in to another orthopedic surgeon about the leg which she accept an amputation and she said she would not therefore I am not going to press this issue for the moment 12/14; continued improvement in his substantial wound on the right medial foot. There is still a necrotic area inferiorly with tightly adherent necrotic debris which I have been working on debriding each time she is here. She does not have an orthopedic appointment. Since last time she was here I looked over her cultures which were essentially MRSA on the foot wound and gram-negative rods in  the abscess on the anterior leg. 12/21; continued improvement in the area on the right medial foot. She is not up on this much and that is probably a good thing since I do not know it could support continuous ambulation. She has a small area on the right lateral leg which were remanence of the IandD's I did because of the abscess. I think she should probably have prophylactic antibiotics I am going to have to look this over to see if we can make an intelligent decision here. In the meantime her major wound is come down nicely. Necrotic area inferiorly is still there but looks a lot better 04/06/2019; she has had some improvement in the overall surface area on the right medial foot somewhat narrowedr both but somewhat longer. The areas on the right lateral leg which were initial IandD sites are superficial. Nothing is present on the right heel. We are using silver alginate to the wound areas 1/18; right medial foot  somewhat smaller. Still a deep probing area in the most distal recess of the wound. She has nothing open on the right leg. She has a new wound on the plantar aspect of her left fourth toe which may have come from just pulling skin. The patient using Medihoney on the wound on her foot under silver alginate. I cannot discourage her from this 2/1; 2-week follow-up using silver alginate on the right foot and her left fourth toe. The area on the right dorsal foot is contracted although there is still the deep area in the most distal part of the wound but still has some probing depth. No overt infection 2/15; 2-week follow-up. She continues to have improvement in the surface area on the dorsal right foot. Even the tunneling area from last time is almost closed. The area that was on the plantar part of her left fourth toe over the PIP is indeed closed 3/1; 2-week follow-up. Continued improvement in surface area. The original divot that we have been debriding inferiorly I think has full epithelialization although the epithelialization is gone down into the wound with probably 4 mm of depth. Even under intense illumination I am unable to see anything open here. The remanence of the wound in this area actually look quite healthy. We have been using silver alginate 3/15; 2-week follow-up. Unfortunately not as good today. She has a comma shaped wound on the dorsal foot however the upper part of this is larger. Under illumination debris on the surface She also tells [place] that she was on her right leg 2 times in the last couple of weeks mostly to reach up for things above her head etc. She felt a sharp pain in the right leg which she thinks is somewhere from the ankle to the knee. The patient has neuropathy and is really uncertain. She cannot feel her foot so she does not think it was coming from there 3/29; 2-week follow-up. Her wound measures smaller. Surface of the wound appears reasonable. She is using silver  alginate with underlying Medihoney. She has home health. X-rays I did of her tib-fib last time were negative although it did show arterial calcification 4/12; 2-week follow-up. Her wound measures smaller in length. Using manuka honey with silver alginate on top. She has home health. 4/26; 2-week follow-up. Her wound is smaller but still very adherent debris under illumination requiring debridement she has been using manuka honey with silver alginate. She has home health 08/28/19-Wound has about the same size, but with a layer of  eschar at the lateral edge of the amputation site on the right foot. Been using Hydrofera Blue. She is on suppressive Bactrim but apparently she has been taking it twice daily 6/7; I have not seen this wound and about 6 weeks. Since then she was up in [place]. By her own admission she was walking on the foot because she did not have a wheelchair. The wound is not nearly as healthy looking as it was the last time I saw this. We ordered different things for her but she only uses Medihoney and silver alginate. As far as I know she is on suppressive trimethoprim sulfamethoxazole. She does not admit to any fever or chills. Her CBGs apparently are at baseline however she is saying that she feels some discomfort on the lateral part of her ankle I looked over her last inflammatory markers from the summer 2020 at which time she had a deeply necrotic infected wound in this area. On 11/10/2018 her sedimentation rate was 56 and C-reactive protein 9.9. This was 107 and 29 on 07/29/2018. 6/17; the patient had a necrotic wound the last time she was here on the right dorsal foot. After debridement I did a culture. This showed a very resistant ESBL Klebsiella as well as Enterococcus. Her x-ray of the foot which was done because of warmth and some discomfort showed bone destruction within the carpal bones involving the navicular acute cuboid lateral middle cuneiforms but essentially unchanged  from her prior study which was done on 10/29/2018. The findings were felt to represent chronic osteomyelitis. We did inflammatory markers on her. Her white count was 5.25 sedimentation rate 16 and C-reactive protein at 11.1. Notable for the fact that in August 2020 her CRP was 9.9 and sedimentation rate 56. I have looked at her x-rays. It is true that the bone destruction is very impressive however the patient came into this clinic for the wound on her right foot with pieces of bone literally falling out anteriorly with purulent material. I am not exactly sure I could have expected anything different. She has not been systemically unwell no fever chills or blood sugars have been reasonable. 6/28; she arrives with a right heel closed. The substantial area on the right anterior foot looks healthy. Much better looking surface. I think we can change to [facility] [place] seems to help this previously. She is getting her antibiotics at dialysis she should be just about finished 7/9; changed to [facility] last week. Surface wound looks satisfactory not much change in surface area however. She is going to [place] state next week this is usually a difficult thing for this patient follow-up will be for 2 weeks. 7/23; using Hydrofera Blue. She returns from her trip and the wound looks surprisingly good. Usually when this patient goes on trips she comes back with a lot of problems with the wound. She is saying that she sometimes feels an episodic "crunching" feeling on the lateral part of the foot. She is neuropathic and not feeling pain but wonders whether this could be a neuropathic dysesthesia. 11/13/19-Patient returns after 3 weeks, the wound itself is stable and patient states that there is nothing new going on she is on some extra anxiety medications and is resisting the temptation to pick at the dry skin around the wound. 9/20; patient has not been here in over a month and I have not seen her in 2  months. The wound in terms of size I think is about the same. There is no  exposed bone. She has a nonviable surface on this. She is supposed to be using [facility] however she is also been using some form of honey preparation as well as a silver-based dressings. I do not think she has any pattern to this. 10/4; 2-week follow-up. Patient has been using some form of spray which she says has honey and silver to purchase this online she has been covering it with gauze. In spite of this the wound actually looks quite good. The deeper divot distally appears to be close down. There is a rim of epithelialization. 10/18; 2-week follow-up. Patient has been using her Hydrofera Blue covered with her silver honey spray that she got online. 11/1; 2-week follow-up. She is using Hydrofera Blue with a silver honey spray. Wound bed is measuring smaller. She has noticed that her foot is warmer on the right. She is concerned about infection. For a long period of time I had her on prophylactic trimethoprim sulfamethoxazole DS 1 tablet daily. She is asking for this to be restarted. The patient is walking on this foot because of repairs that are being done in a home her but her room is on the second floor she has to go up and down stairs. I have cautioned against this however as usual she will do exactly what she wants to do 11/15; 2-week follow-up. She uses Hydrofera Blue with a silver/honey spray which I have never heard of. I think her wound looks about the same. Some epithelialization. No evidence that this is infected. I think she is walking on this more than we agreed on. She is going on extensive vacation over Thanksgiving 12/3; 2-week follow-up. She is using Hydrofera Blue however over Thanksgiving she ran out of this and she is simply been using Medihoney. In spite of this her wound is smaller almost divided into 2 now. She traveled extensively over Thanksgiving and actually looks quite good in spite of this.  Usually this is been a marker of problems for her 12/17; 2-week follow-up. She is using Hydrofera Blue. The wound is smaller. Debris on the surface of this is fibrinous. She is traveling to [place] over the holidays which never bodes well for her wounds. I think she is walking more on her feet then she is even willing to admit and she tells me she does walk 2/11; using Hydrofera Blue. Her wounds are contracting however she walks in the clinic with a history that she has not been able to eat she has had vomiting. She also has right eye problems. Her blood sugar was 567. She had blood work done at dialysis and we called there to get her blood work although it still had not returned although they should have this by the end of the day we were told. She also stated that she was not sure what her blood sugar was as her glucose monitor was not working and not coming to tomorrow. She also for some reason does not think her insulin pump is working well. Her endocrinologist is Dr. Elayne Snare 06/03/2020 upon evaluation today patient actually appears to be doing excellent in regard to her wound. In fact she tells me it was not even bleeding until she picked a dry piece of skin off while we were getting ready to come in and see her today. Fortunately there is no signs of active infection which is great news and overall very pleased. She is going to be seeing her neurologist sometime shortly I believe it might even be  on Monday. Nonetheless there can proceed with the work-up as far as anything else going on with her eye the fact that her right eyelid is drooping. 3/25; right medial dorsal foot. She has superficial areas here that appear to be fully epithelialized she is using Medihoney and silver alginate and some combination. ooShe has a new area on the mid left foot at roughly the level of the fifth metatarsal base 4/84/8; right medial dorsal foot. Still superficial areas here. I cleaned up 1 of these today she  has been using I think mostly Medihoney but I would like her to use silver alginate. The area on the mid part of her foot also looks improved on the left. Since she was last here she tells me that she noted pus in the area of her left foot. She told dialysis about this they did a culture and ultimately she is on IV antibiotics but were not sure which one. They referred her to [name] he said that he did not need to follow her. I have not really verified any of this and I do not know what antibiotic she is on at dialysis 4/29; patient has been using Medihoney to her wounds. She reports taking off the toenail to her left second toe. She states that home health discharged her and she has not had help with dressing changes. She denies any signs and symptoms of infection. 5/13; 2-week follow-up. Miraculously the wound on the right foot dorsally is healed. She has an area on the tip of her left third toe and an area in the left midfoot. 5/26; patient presents for 2-week follow-up. She has 2 open wounds 1 to her plantar foot and the other to the left third toe. She has noticed increased swelling and redness to the toe wound. It is unclear exactly what she is doing for dressing changes as she has multiple dressing options at home. She states that it is easiest to do [facility] on the plantar wound and Prisma or Medihoney on the toe wound. Patient History Information obtained from Patient. Family History Cancer - Father, Diabetes - Maternal Grandparents, Stroke - Paternal Grandparents, No family history of Heart Disease, Hereditary Spherocytosis, Hypertension, Kidney Disease, Lung Disease, Seizures, Thyroid Problems, Tuberculosis. Social History Former smoker, Marital Status - Single, Alcohol Use - Moderate, Drug Use - Current History - TCH, Caffeine Use - Moderate - tea. Medical History Eyes Patient has history of Cataracts - mild Denies history of Glaucoma, Optic  Neuritis Ear/Nose/Mouth/Throat Patient has history of Chronic sinus problems/congestion - "my nose runs all the time" Denies history of Middle ear problems Hematologic/Lymphatic Patient has history of Anemia Denies history of Hemophilia, Human Immunodeficiency Virus, Lymphedema, Sickle Cell Disease Respiratory Patient has history of Sleep Apnea - doesn't tolorate cpap Denies history of Aspiration, Asthma, Chronic Obstructive Pulmonary Disease (COPD), Pneumothorax, Tuberculosis Cardiovascular Patient has history of Deep Vein Thrombosis - bil arms and leg leg, Hypertension, Peripheral Arterial Disease Denies history of Angina, Arrhythmia, Congestive Heart Failure, Coronary Artery Disease, Hypotension, Myocardial Infarction, Peripheral Venous Disease, Phlebitis, Vasculitis Gastrointestinal Denies history of Cirrhosis , Colitis, Crohnoos, Hepatitis A, Hepatitis B, Hepatitis C Endocrine Patient has history of Type I Diabetes - 1974 Denies history of Type II Diabetes Genitourinary Denies history of End Stage Renal Disease Immunological Denies history of Lupus Erythematosus, Raynaudoos, Scleroderma Integumentary (Skin) Denies history of History of Burn Musculoskeletal Patient has history of Osteoarthritis - in feet, Osteomyelitis - left foot and right 1st met head Denies history  of Gout, Rheumatoid Arthritis Neurologic Patient has history of Neuropathy - feet, Seizure Disorder - diabetic related Denies history of Dementia, Quadriplegia, Paraplegia Oncologic Denies history of Received Chemotherapy, Received Radiation Psychiatric Denies history of Anorexia/bulimia, Confinement Anxiety Hospitalization/Surgery History - limb preservation. 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 - c-section. - exostectomy left cuboid and 5th metatarsal. - laser eye surgery. - left fem-pop bypass graft. - hemrroidectomy. - left eye cataract removed. - kidney stone. Medical A Surgical  History Notes nd Cardiovascular hyperlipidemia Genitourinary CKD Objective Constitutional respirations regular, non-labored and within target range for patient.. Vitals Time Taken: 12:58 PM, Height: 67 in, Source: Stated, Weight: 125 lbs, Source: Stated, BMI: 19.6, Temperature: 99 F, Pulse: 83 bpm, Respiratory Rate: 18 breaths/min, Blood Pressure: 114/73 mmHg, Capillary Blood Glucose: 171 mg/dl. General Notes: glucose per pt report Psychiatric pleasant and cooperative. General Notes: Left plantar foot wound: Nonviable tissue on surface and once this was removed there was healthy granulation tissue underneath. No signs of infection. Third left toe: The distal tip has an open wound and overall the toe is slightly inflamed with swelling. No purulent drainage. Integumentary (Hair, Skin) Wound #50 status is Open. Original cause of wound was Skin T ear/Laceration. The date acquired was: 06/24/2020. The wound has been in treatment 8 weeks. The wound is located on the Fifty-Six. The wound measures 0.2cm length x 0.2cm width x 0.3cm depth; 0.031cm^2 area and 0.009cm^3 volume. There is Fat Layer (Subcutaneous Tissue) exposed. There is no tunneling noted, however, there is undermining starting at 12:00 and ending at 12:00 with a maximum distance of 0.4cm. There is a small amount of serosanguineous drainage noted. The wound margin is well defined and not attached to the wound base. There is large (67-100%) red granulation within the wound bed. There is no necrotic tissue within the wound bed. Wound #51 status is Open. Original cause of wound was Trauma. The date acquired was: 07/29/2020. The wound has been in treatment 3 weeks. The wound is located on the Left T Second. The wound measures 0.5cm length x 0.6cm width x 0.1cm depth; 0.236cm^2 area and 0.024cm^3 volume. There is Fat Layer oe (Subcutaneous Tissue) exposed. There is no tunneling or undermining noted. There is a small amount of  serosanguineous drainage noted. The wound margin is distinct with the outline attached to the wound base. There is large (67-100%) red granulation within the wound bed. There is no necrotic tissue within the wound bed. 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 The plantar wound postdebridement appears well-healing. I recommended continuing Hydrofera Blue to this area. The third left toe does appear infected and I will send in Keflex. I recommended she continue to use Prisma on this. We discussed aggressive offloading. She is a patient of Dr. Janalyn Rouse and will follow-up in 1 week. Procedures Wound #50 Pre-procedure diagnosis of Wound #50 is a Diabetic Wound/Ulcer of the Lower Extremity located on the [place] .Severity of Tissue Pre Debridement is: Fat layer exposed. There was a Excisional Skin/Subcutaneous Tissue Debridement with a total area of 0.25 sq cm performed by Kalman Shan, DO. With the following instrument(s): Curette to remove Viable and Non-Viable tissue/material. Material removed includes Callus,  Subcutaneous Tissue, Slough, Skin: Dermis, Skin: Epidermis, and Fibrin/Exudate after achieving pain control using Lidocaine 4% T opical Solution. A time out was conducted at 13:15, prior to the start of the procedure. A Moderate amount of bleeding was controlled with Pressure. The procedure was tolerated well with a pain level of 0 throughout and a pain level of 0 following the procedure. Post Debridement Measurements: 0.5cm length x 0.5cm width x 0.3cm depth; 0.059cm^3 volume. Character of Wound/Ulcer Post Debridement is improved. Severity of Tissue Post  Debridement is: Fat layer exposed. Post procedure Diagnosis Wound #50: Same as Pre-Procedure Plan Follow-up Appointments: Return Appointment in 1 week. - Dr. Dellia Nims Bathing/ Shower/ Hygiene: May shower and wash wound with soap and water. Edema Control - Lymphedema / SCD / Other: Elevate legs to the level of the heart or above for 30 minutes daily and/or when sitting, a frequency of: - throughout the day Avoid standing for long periods of time. Moisturize legs daily. - with dressing changes Off-Loading: Open toe surgical shoe to: - to both feet Other: - minimal weight bearing right foot The following medication(s) was prescribed: Keflex oral 500 mg capsule 1 1 capsule twice daily for 5 days starting 08/25/2020 WOUND #50: - Foot Wound Laterality: Plantar, Left Cleanser: Wound Cleanser Every Other Day/30 Days Discharge Instructions: Cleanse the wound with wound cleanser or normal saline prior to applying a clean dressing using gauze sponges, not tissue or cotton balls. Prim Dressing: Hydrofera Blue Classic Foam, 2x2 in Every Other Day/30 Days ary Discharge Instructions: Moisten with saline prior to applying to wound bed Secondary Dressing: Woven Gauze Sponge, Non-Sterile 4x4 in Every Other Day/30 Days Discharge Instructions: Apply over primary dressing as directed. Secured With: Elastic Bandage 4 inch (ACE bandage) Every Other Day/30 Days Discharge Instructions: Secure with ACE bandage as directed. Secured With: [employer], 4.5x3.1 (in/yd) Every Other Day/30 Days Discharge Instructions: Secure with Kerlix as directed. Secured With: Paper Tape, 2x10 (in/yd) Every Other Day/30 Days Discharge Instructions: Secure dressing with tape as directed. WOUND #51: - T Second Wound Laterality: Left oe Cleanser: Soap and Water Every Other Day/15 Days Discharge Instructions: May shower and wash wound with dial antibacterial soap and water prior to dressing change. Prim Dressing: Promogran  Prisma Matrix, 4.34 (sq in) (silver collagen) Every Other Day/15 Days ary Discharge Instructions: Moisten collagen with saline or hydrogel Secondary Dressing: Woven Gauze Sponges 2x2 in Every Other Day/15 Days Discharge Instructions: Apply over primary dressing as directed. Secured With: Child psychotherapist, Sterile 2x75 (in/in) Every Other Day/15 Days Discharge Instructions: Secure with stretch gauze as directed. Secured With: Paper Tape, 1x10 (in/yd) Every Other Day/15 Days Discharge Instructions: Secure dressing with tape as directed. 1. Hydrofera Blue to the plantar foot wound, collagen to the toe 2. Keflex 3. Follow-up in 1 week Electronic Signature(s) Signed: 08/25/2020 1:40:59 PM By: Kalman Shan DO Entered By: Kalman Shan on 08/25/2020 13:40:16 -------------------------------------------------------------------------------- HxROS Details Patient Name: Date of Service: [name] 08/25/2020 12:30 PM Medical Record Number: [id number] Patient Account Number: [id number] Date of Birth/Sex: Treating RN: November 22, 1970 (50 y.o. [name] Primary Care Provider: [name], [name] Other Clinician: Referring Provider: Treating Provider/Extender: Kalman Shan MO REIRA, [name] Weeks in Treatment: 30 Information Obtained From Patient Eyes Medical History: Positive for: Cataracts - mild Negative for: Glaucoma; Optic Neuritis Ear/Nose/Mouth/Throat Medical History: Positive for: Chronic sinus problems/congestion - "my nose runs all the time" Negative for: Middle ear problems Hematologic/Lymphatic Medical History: Positive  for: Anemia Negative for: Hemophilia; Human Immunodeficiency Virus; Lymphedema; Sickle Cell Disease Respiratory Medical History: Positive for: Sleep Apnea - doesn't tolorate cpap Negative for: Aspiration; Asthma; Chronic Obstructive Pulmonary Disease (COPD); Pneumothorax; Tuberculosis Cardiovascular Medical  History: Positive for: Deep Vein Thrombosis - bil arms and leg leg; Hypertension; Peripheral Arterial Disease Negative for: Angina; Arrhythmia; Congestive Heart Failure; Coronary Artery Disease; Hypotension; Myocardial Infarction; Peripheral Venous Disease; Phlebitis; Vasculitis Past Medical History Notes: hyperlipidemia Gastrointestinal Medical History: Negative for: Cirrhosis ; Colitis; Crohns; Hepatitis A; Hepatitis B; Hepatitis C Endocrine Medical History: Positive for: Type I Diabetes - 1975 Negative for: Type II Diabetes Time with diabetes: 76 years Treated with: Insulin Blood sugar tested every day: Yes Tested : 4-7 times per day Genitourinary Medical History: Negative for: End Stage Renal Disease Past Medical History Notes: CKD Immunological Medical History: Negative for: Lupus Erythematosus; Raynauds; Scleroderma Integumentary (Skin) Medical History: Negative for: History of Burn Musculoskeletal Medical History: Positive for: Osteoarthritis - in feet; Osteomyelitis - left foot and right 1st met head Negative for: Gout; Rheumatoid Arthritis Neurologic Medical History: Positive for: Neuropathy - feet; Seizure Disorder - diabetic related Negative for: Dementia; Quadriplegia; Paraplegia Oncologic Medical History: Negative for: Received Chemotherapy; Received Radiation Psychiatric Medical History: Negative for: Anorexia/bulimia; Confinement Anxiety HBO Extended History Items Ear/Nose/Mouth/Throat: Eyes: Chronic sinus Cataracts problems/congestion Immunizations Pneumococcal Vaccine: Received Pneumococcal Vaccination: Yes Implantable Devices Yes Hospitalization / Surgery History Type of Hospitalization/Surgery 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 c-section exostectomy left cuboid and 5th metatarsal laser eye surgery left fem-pop bypass graft hemrroidectomy left eye cataract removed kidney stone Family and Social  History Cancer: Yes - Father; Diabetes: Yes - Maternal Grandparents; Heart Disease: No; Hereditary Spherocytosis: No; Hypertension: No; Kidney Disease: No; Lung Disease: No; Seizures: No; Stroke: Yes - Paternal Grandparents; Thyroid Problems: No; Tuberculosis: No; Former smoker; Marital Status - Single; Alcohol Use: Moderate; Drug Use: Current History - TCH; Caffeine Use: Moderate - tea; Financial Concerns: No; Food, Clothing or Shelter Needs: No; Support System Lacking: No; Transportation Concerns: No Electronic Signature(s) Signed: 08/25/2020 1:40:59 PM By: Kalman Shan DO Signed: 08/25/2020 6:02:39 PM By: Deon Pilling Entered By: Kalman Shan on 08/25/2020 13:37:03 -------------------------------------------------------------------------------- SuperBill Details Patient Name: Date of Service: [name] 08/25/2020 Medical Record Number: [id number] Patient Account Number: [id number] Date of Birth/Sex: Treating RN: 11/10/1970 (50 y.o. 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 Facility Procedures CPT4 Code: [id number] Description: 11042 - DEB SUBQ TISSUE 20 SQ CM/< ICD-10 Diagnosis Description L97.521 Non-pressure chronic ulcer of other part of left foot limited to breakdown of Modifier: skin Quantity: 1 Physician Procedures : CPT4 Code Description Modifier [id number] - WC PHYS LEVEL 3 - EST PT ICD-10 Diagnosis Description L97.521 Non-pressure chronic ulcer of other part  of left foot limited to breakdown of skin L97.529 Non-pressure chronic ulcer of other part of left  foot with unspecified severity Quantity: 1 : [id number] 11042 - WC PHYS SUBQ TISS 20 SQ CM ICD-10 Diagnosis Description L97.521 Non-pressure chronic ulcer of other part of left foot limited  to breakdown of skin Quantity: 1 Electronic Signature(s) Signed: 08/25/2020 1:40:59 PM By: Kalman Shan DO Entered By: Kalman Shan on 08/25/2020 13:40:33

## 2020-08-25 NOTE — Progress Notes (Signed)
TERIN, [name] ([id number]) Visit Report for 08/25/2020 Arrival Information Details Patient Name: Date of Service: [name] 08/25/2020 12:30 PM Medical Record Number: [id number] Patient Account Number: [id number] Date of Birth/Sex: Treating RN: [birth year] (50 y.o. [name] Primary Care Meital Riehl: [name], [name] Other Clinician: Referring Lizania Bouchard: Treating Arika Mainer/Extender: Kalman Shan MO REIRA, [name] Weeks in Treatment: 79 Visit Information History Since Last Visit Added or deleted any medications: No Patient Arrived: Wheel Chair Any new allergies or adverse reactions: No Arrival Time: 12:57 Had a fall or experienced change in No Accompanied By: self activities of daily living that may affect Transfer Assistance: None risk of falls: Patient Identification Verified: Yes Signs or symptoms of abuse/neglect since last visito No Secondary Verification Process Completed: Yes Hospitalized since last visit: No Patient Requires Transmission-Based Precautions: No Implantable device outside of the clinic excluding No Patient Has Alerts: No cellular tissue based products placed in the center since last visit: Has Dressing in Place as Prescribed: Yes Pain Present Now: No Electronic Signature(s) Signed: 08/25/2020 5:45:23 PM By: Baruch Gouty RN, BSN Entered By: Baruch Gouty on 08/25/2020 12:58:27 -------------------------------------------------------------------------------- Encounter Discharge Information Details Patient Name: Date of Service: [name], [name] 08/25/2020 12:30 PM Medical Record Number: [id number] Patient Account Number: [id number] Date of Birth/Sex: Treating RN: [birth year] (50 y.o. [name] Primary Care Firas Guardado: [name], [name] Other Clinician: Referring Davante Gerke: Treating Cordarius Benning/Extender: Kalman Shan MO REIRA, [name] Weeks in Treatment: 58 Encounter Discharge Information Items Post Procedure  Vitals Discharge Condition: Stable Temperature (F): 99 Ambulatory Status: Wheelchair Pulse (bpm): 83 Discharge Destination: Home Respiratory Rate (breaths/min): 18 Transportation: Other Blood Pressure (mmHg): 114/73 Accompanied By: self Schedule Follow-up Appointment: Yes Clinical Summary of Care: Patient Declined Notes transportation service Electronic Signature(s) Signed: 08/25/2020 5:45:23 PM By: Baruch Gouty RN, BSN Entered By: Baruch Gouty on 08/25/2020 17:26:29 -------------------------------------------------------------------------------- Lower Extremity Assessment Details Patient Name: Date of Service: [name] 08/25/2020 12:30 PM Medical Record Number: [id number] Patient Account Number: [id number] Date of Birth/Sex: Treating RN: 09-Dec-1970 (50 y.o. [name] Primary Care Tinie Mcgloin: [name], [name] Other Clinician: Referring Ruvi Fullenwider: Treating [place] Natarajan/Extender: Kalman Shan MO REIRA, [name] Weeks in Treatment: 103 Edema Assessment Assessed: [Left: No] [Right: No] Edema: [Left: N] [Right: o] Calf Left: Right: Point of Measurement: From Medial Instep 31 cm Ankle Left: Right: Point of Measurement: From Medial Instep 21 cm Vascular Assessment Pulses: Dorsalis Pedis Palpable: [Left:No] Electronic Signature(s) Signed: 08/25/2020 5:45:23 PM By: Baruch Gouty RN, BSN Entered By: Baruch Gouty on 08/25/2020 13:07:21 -------------------------------------------------------------------------------- Multi Wound Chart Details Patient Name: Date of Service: [name] 08/25/2020 12:30 PM Medical Record Number: [id number] Patient Account Number: [id number] Date of Birth/Sex: Treating RN: 1970-12-25 (50 y.o. [name] Primary Care Jakeb Lamping: [name], [name] Other Clinician: Referring Joncarlos Atkison: Treating Cordelro Gautreau/Extender: Kalman Shan MO REIRA, [name] Weeks in Treatment: 103 Vital Signs Height(in):  67 Capillary Blood Glucose(mg/dl): 171 Weight(lbs): 125 Pulse(bpm): 57 Body Mass Index(BMI): 20 Blood Pressure(mmHg): 114/73 Temperature(F): 99 Respiratory Rate(breaths/min): 18 Photos: [50:No Photos Left, Plantar Foot] [51:No 62 Left T Second oe] [N/A:N/A N/A] Wound Location: [50:Skin Tear/Laceration] [51:Trauma] [N/A:N/A] Wounding Event: [50:Diabetic Wound/Ulcer of the Lower] [51:Diabetic Wound/Ulcer of the Lower] [N/A:N/A] Primary Etiology: [50:Extremity Cataracts, Chronic sinus] [51:Extremity Cataracts, Chronic sinus] [N/A:N/A] Comorbid History: [50:problems/congestion, Anemia, Sleep Apnea, Deep Vein Thrombosis, Hypertension, Peripheral Arterial Disease, Type I Diabetes, Osteoarthritis, Osteomyelitis, Neuropathy, Seizure Disorder  06/24/2020] [51:problems/congestion, Anemia,  Sleep Apnea, Deep Vein Thrombosis, Hypertension, Peripheral Arterial Disease, Type I Diabetes, Osteoarthritis, Osteomyelitis, Neuropathy, Seizure Disorder 07/29/2020] [N/A:N/A] Date Acquired: [50:8] [51:3] [N/A:N/A] Weeks of Treatment: [50:Open] [51:Open] [N/A:N/A] Wound Status: [50:0.2x0.2x0.3] [51:0.5x0.6x0.1] [N/A:N/A] Measurements L x W x D (cm) [50:0.031] [51:0.236] [N/A:N/A] A (cm) : rea [50:0.009] [51:0.024] [N/A:N/A] Volume (cm) : [50:84.20%] [51:80.00%] [N/A:N/A] % Reduction in A [50:rea: 76.90%] [51:93.20%] [N/A:N/A] % Reduction in Volume: [50:12] Starting Position 1 (o'clock): [50:12] Ending Position 1 (o'clock): [50:0.4] Maximum Distance 1 (cm): [50:Yes] [51:No] [N/A:N/A] Undermining: [50:Grade 1] [51:Grade 2] [N/A:N/A] Classification: [50:Small] [51:Small] [N/A:N/A] Exudate A mount: [50:Serosanguineous] [51:Serosanguineous] [N/A:N/A] Exudate Type: [50:red, brown] [51:red, brown] [N/A:N/A] Exudate Color: [50:Well defined, not attached] [51:Distinct, outline attached] [N/A:N/A] Wound Margin: [50:Large (67-100%)] [51:Large (67-100%)] [N/A:N/A] Granulation A mount: [50:Red] [51:Red]  [N/A:N/A] Granulation Quality: [50:None Present (0%)] [51:None Present (0%)] [N/A:N/A] Necrotic A mount: [50:Fat Layer (Subcutaneous Tissue): Yes Fat Layer (Subcutaneous Tissue): Yes N/A] Exposed Structures: [50:Fascia: No Tendon: No Muscle: No Joint: No Bone: No Small (1-33%)] [51:Fascia: No Tendon: No Muscle: No Joint: No Bone: No None] [N/A:N/A] Epithelialization: [50:Debridement - Excisional] [51:N/A] [N/A:N/A] Debridement: Pre-procedure Verification/Time Out 13:15 [51:N/A] [N/A:N/A] Taken: [50:Lidocaine 4% Topical Solution] [51:N/A] [N/A:N/A] Pain Control: [50:Callus, Subcutaneous, Slough] [51:N/A] [N/A:N/A] Tissue Debrided: [50:Skin/Subcutaneous Tissue] [51:N/A] [N/A:N/A] Level: [50:0.25] [51:N/A] [N/A:N/A] Debridement A (sq cm): [50:rea Curette] [51:N/A] [N/A:N/A] Instrument: [50:Moderate] [51:N/A] [N/A:N/A] Bleeding: [50:Pressure] [51:N/A] [N/A:N/A] Hemostasis A chieved: [50:0] [51:N/A] [N/A:N/A] Procedural Pain: [50:0] [51:N/A] [N/A:N/A] Post Procedural Pain: [50:Procedure was tolerated well] [51:N/A] [N/A:N/A] Debridement Treatment Response: [50:0.5x0.5x0.3] [51:N/A] [N/A:N/A] Post Debridement Measurements L x W x D (cm) [50:0.059] [51:N/A] [N/A:N/A] Post Debridement Volume: (cm) [50:Debridement] [51:N/A] [N/A:N/A] Treatment Notes Electronic Signature(s) Signed: 08/25/2020 1:40:59 PM By: Kalman Shan DO Signed: 08/25/2020 6:02:39 PM By: Deon Pilling Entered By: Kalman Shan on 08/25/2020 13:35:14 -------------------------------------------------------------------------------- Multi-Disciplinary Care Plan Details Patient Name: Date of Service: [place] LLO [address] [name] 08/25/2020 12:30 PM Medical Record Number: [id number] Patient Account Number: [id number] Date of Birth/Sex: Treating RN: [birth year] (50 y.o. [name] Primary Care Jeston Junkins: [name], [name] Other Clinician: Referring Jolane Bankhead: Treating Ranya Fiddler/Extender: Idelia Salm, NIA  LL Weeks in Treatment: [place] reviewed with physician Active Inactive Nutrition Nursing Diagnoses: Imbalanced nutrition Impaired glucose control: actual or potential Potential for alteratiion in Nutrition/Potential for imbalanced nutrition Goals: Patient/caregiver agrees to and verbalizes understanding of need to use nutritional supplements and/or vitamins as prescribed Date Initiated: 09/03/2018 Date Inactivated: 10/07/2018 Target Resolution Date: 10/01/2018 Goal Status: Met Patient/caregiver will maintain therapeutic glucose control Date Initiated: 09/03/2018 Target Resolution Date: 09/30/2020 Goal Status: Active Interventions: Provide education on elevated blood sugars and impact on wound healing Treatment Activities: Patient referred to Primary Care Physician for further nutritional evaluation : 09/03/2018 Notes: 07/29/20: Glucose control ongoing, target date extended. Wound/Skin Impairment Nursing Diagnoses: Impaired tissue integrity Knowledge deficit related to ulceration/compromised skin integrity Goals: Patient/caregiver will verbalize understanding of skin care regimen Date Initiated: 09/03/2018 Target Resolution Date: 09/30/2020 Goal Status: Active Ulcer/skin breakdown will have a volume reduction of 30% by week 4 Date Initiated: 09/03/2018 Date Inactivated: 10/07/2018 Target Resolution Date: 10/01/2018 Goal Status: Unmet Unmet Reason: COMORBITIES Ulcer/skin breakdown will have a volume reduction of 50% by week 8 Date Initiated: 10/07/2018 Date Inactivated: 11/03/2018 Target Resolution Date: 10/31/2018 Goal Status: Unmet Unmet Reason: Osteomyelitis Interventions: Assess patient/caregiver ability to obtain necessary supplies Assess patient/caregiver ability to perform ulcer/skin care regimen upon admission and as needed Assess ulceration(s) every visit Provide education on ulcer  and skin care Treatment Activities: Skin care regimen initiated :  09/03/2018 Topical wound management initiated : 09/03/2018 Notes: Electronic Signature(s) Signed: 08/25/2020 6:02:39 PM By: Deon Pilling Entered By: Deon Pilling on 08/25/2020 13:02:01 -------------------------------------------------------------------------------- Pain Assessment Details Patient Name: Date of Service: Lorelee New [name] 08/25/2020 12:30 PM Medical Record Number: [id number] Patient Account Number: [id number] Date of Birth/Sex: Treating RN: [birth year] (50 y.o. [name] Primary Care Lilliemae Fruge: [name], [name] Other Clinician: Referring Tieara Flitton: Treating Paislei Dorval/Extender: Kalman Shan MO REIRA, [name] Weeks in Treatment: 64 Active Problems Location of Pain Severity and Description of Pain Patient Has Paino No Patient Has Paino No Site Locations Rate the pain. Current Pain Level: 0 Pain Management and Medication Current Pain Management: Electronic Signature(s) Signed: 08/25/2020 5:45:23 PM By: Baruch Gouty RN, BSN Entered By: Baruch Gouty on 08/25/2020 13:07:08 -------------------------------------------------------------------------------- Patient/Caregiver Education Details Patient Name: Date of Service: [name] 5/26/2022andnbsp12:30 PM Medical Record Number: [id number] Patient Account Number: [id number] Date of Birth/Gender: Treating RN: [birth year] (50 y.o. [name] Primary Care Physician: [name], [name] Other Clinician: Referring Physician: Treating Physician/Extender: Kalman Shan MO REIRA, [name] Weeks in Treatment: 75 Education Assessment Education Provided To: Patient Education Topics Provided Wound/Skin Impairment: Handouts: Skin Care Do's and Dont's Methods: Explain/Verbal Responses: Reinforcements needed Electronic Signature(s) Signed: 08/25/2020 6:02:39 PM By: Deon Pilling Entered By: Deon Pilling on 08/25/2020  13:02:20 -------------------------------------------------------------------------------- Wound Assessment Details Patient Name: Date of Service: Lorelee New [name] 08/25/2020 12:30 PM Medical Record Number: [id number] Patient Account Number: [id number] Date of Birth/Sex: Treating RN: [birth year] (50 y.o. [name] Primary Care Trea Latner: Other Clinician: [name], [name] Referring Tameca Jerez: Treating Posey Petrik/Extender: Kalman Shan MO REIRA, [name] Weeks in Treatment: 103 Wound Status Wound Number: 22 Primary Diabetic Wound/Ulcer of the Lower Extremity Etiology: Wound Location: Left, Plantar Foot Wound Open Wounding Event: Skin Tear/Laceration Status: Date Acquired: 06/24/2020 Comorbid Cataracts, Chronic sinus problems/congestion, Anemia, Sleep Weeks Of Treatment: 8 History: Apnea, Deep Vein Thrombosis, Hypertension, Peripheral Arterial Clustered Wound: No Disease, Type I Diabetes, Osteoarthritis, Osteomyelitis, Neuropathy, Seizure Disorder Photos Wound Measurements Length: (cm) 0.2 Width: (cm) 0.2 Depth: (cm) 0.3 Area: (cm) 0.031 Volume: (cm) 0.009 % Reduction in Area: 84.2% % Reduction in Volume: 76.9% Epithelialization: Small (1-33%) Tunneling: No Undermining: Yes Starting Position (o'clock): 12 Ending Position (o'clock): 12 Maximum Distance: (cm) 0.4 Wound Description Classification: Grade 1 Wound Margin: Well defined, not attached Exudate Amount: Small Exudate Type: Serosanguineous Exudate Color: red, brown Foul Odor After Cleansing: No Slough/Fibrino No Wound Bed Granulation Amount: Large (67-100%) Exposed Structure Granulation Quality: Red Fascia Exposed: No Necrotic Amount: None Present (0%) Fat Layer (Subcutaneous Tissue) Exposed: Yes Tendon Exposed: No Muscle Exposed: No Joint Exposed: No Bone Exposed: No Treatment Notes Wound #50 (Foot) Wound Laterality: Plantar, Left Cleanser Wound Cleanser Discharge Instruction: Cleanse  the wound with wound cleanser or normal saline prior to applying a clean dressing using gauze sponges, not tissue or cotton balls. Peri-Wound Care Topical Primary Dressing Hydrofera Blue Classic Foam, 2x2 in Discharge Instruction: Moisten with saline prior to applying to wound bed Secondary Dressing Woven Gauze Sponge, Non-Sterile 4x4 in Discharge Instruction: Apply over primary dressing as directed. Secured With Elastic Bandage 4 inch (ACE bandage) Discharge Instruction: Secure with ACE bandage as directed. Kerlix Roll Sterile, 4.5x3.1 (in/yd) Discharge Instruction: Secure with Kerlix as directed. Paper Tape, 2x10 (in/yd) Discharge Instruction: Secure dressing with tape as directed. Compression Wrap Compression Stockings Add-Ons  Electronic Signature(s) Signed: 08/25/2020 4:57:49 PM By: Sandre Kitty Signed: 08/25/2020 5:45:23 PM By: Baruch Gouty RN, BSN Entered By: Sandre Kitty on 08/25/2020 16:52:12 -------------------------------------------------------------------------------- Wound Assessment Details Patient Name: Date of Service: [name] 08/25/2020 12:30 PM Medical Record Number: [id number] Patient Account Number: [id number] Date of Birth/Sex: Treating RN: 06/05/1970 (50 y.o. [name] Primary Care Kirsta Probert: [name], [name] Other Clinician: Referring Bertie Mcconathy: Treating Eartha Vonbehren/Extender: Kalman Shan MO REIRA, [name] Weeks in Treatment: 103 Wound Status Wound Number: 51 Primary Diabetic Wound/Ulcer of the Lower Extremity Etiology: Wound Location: Left T Second oe Wound Open Wounding Event: Trauma Status: Date Acquired: 07/29/2020 Comorbid Cataracts, Chronic sinus problems/congestion, Anemia, Sleep Weeks Of Treatment: 3 History: Apnea, Deep Vein Thrombosis, Hypertension, Peripheral Arterial Clustered Wound: No Disease, Type I Diabetes, Osteoarthritis, Osteomyelitis, Neuropathy, Seizure Disorder Photos Wound  Measurements Length: (cm) 0.5 Width: (cm) 0.6 Depth: (cm) 0.1 Area: (cm) 0.236 Volume: (cm) 0.024 % Reduction in Area: 80% % Reduction in Volume: 93.2% Epithelialization: None Tunneling: No Undermining: No Wound Description Classification: Grade 2 Wound Margin: Distinct, outline attached Exudate Amount: Small Exudate Type: Serosanguineous Exudate Color: red, brown Wound Bed Granulation Amount: Large (67-100%) Granulation Quality: Red Necrotic Amount: None Present (0%) Foul Odor After Cleansing: No Slough/Fibrino Yes Exposed Structure Fascia Exposed: No Fat Layer (Subcutaneous Tissue) Exposed: Yes Tendon Exposed: No Muscle Exposed: No Joint Exposed: No Bone Exposed: No Treatment Notes Wound #51 (Toe Second) Wound Laterality: Left Cleanser Soap and Water Discharge Instruction: May shower and wash wound with dial antibacterial soap and water prior to dressing change. Peri-Wound Care Topical Primary Dressing Promogran Prisma Matrix, 4.34 (sq in) (silver collagen) Discharge Instruction: Moisten collagen with saline or hydrogel Secondary Dressing Woven Gauze Sponges 2x2 in Discharge Instruction: Apply over primary dressing as directed. Secured With Paper Tape, 1x10 (in/yd) Discharge Instruction: Secure dressing with tape as directed. Conforming Stretch Gauze Bandage, Sterile 2x75 (in/in) Discharge Instruction: Secure with stretch gauze as directed. Compression Wrap Compression Stockings Add-Ons Electronic Signature(s) Signed: 08/25/2020 4:57:49 PM By: Sandre Kitty Signed: 08/25/2020 5:45:23 PM By: Baruch Gouty RN, BSN Entered By: Sandre Kitty on 08/25/2020 16:52:41 -------------------------------------------------------------------------------- Vitals Details Patient Name: Date of Service: [name] 08/25/2020 12:30 PM Medical Record Number: [id number] Patient Account Number: [id number] Date of Birth/Sex: Treating RN: 02-22-1971 (50 y.o.  [name] Primary Care Karin Griffith: [name], [name] Other Clinician: Referring Hitoshi Werts: Treating Cleaven Demario/Extender: Kalman Shan MO REIRA, [name] Weeks in Treatment: 103 Vital Signs Time Taken: 12:58 Temperature (F): 99 Height (in): 67 Pulse (bpm): 83 Source: Stated Respiratory Rate (breaths/min): 18 Weight (lbs): 125 Blood Pressure (mmHg): 114/73 Source: Stated Capillary Blood Glucose (mg/dl): 171 Body Mass Index (BMI): 19.6 Reference Range: 80 - 120 mg / dl Notes glucose per pt report Electronic Signature(s) Signed: 08/25/2020 5:45:23 PM By: Baruch Gouty RN, BSN Entered By: Baruch Gouty on 08/25/2020 12:59:34

## 2020-08-26 ENCOUNTER — Ambulatory Visit (HOSPITAL_BASED_OUTPATIENT_CLINIC_OR_DEPARTMENT_OTHER): Payer: Medicare HMO | Admitting: Internal Medicine

## 2020-09-01 ENCOUNTER — Encounter (HOSPITAL_BASED_OUTPATIENT_CLINIC_OR_DEPARTMENT_OTHER): Payer: Medicare HMO | Admitting: Internal Medicine

## 2020-09-02 ENCOUNTER — Encounter (HOSPITAL_BASED_OUTPATIENT_CLINIC_OR_DEPARTMENT_OTHER): Payer: Medicare HMO | Attending: Internal Medicine | Admitting: Internal Medicine

## 2020-09-02 ENCOUNTER — Other Ambulatory Visit: Payer: Self-pay

## 2020-09-02 DIAGNOSIS — M8580 Other specified disorders of bone density and structure, unspecified site: Secondary | ICD-10-CM | POA: Insufficient documentation

## 2020-09-02 DIAGNOSIS — E10621 Type 1 diabetes mellitus with foot ulcer: Secondary | ICD-10-CM | POA: Diagnosis not present

## 2020-09-02 DIAGNOSIS — L97514 Non-pressure chronic ulcer of other part of right foot with necrosis of bone: Secondary | ICD-10-CM | POA: Diagnosis not present

## 2020-09-02 DIAGNOSIS — E1022 Type 1 diabetes mellitus with diabetic chronic kidney disease: Secondary | ICD-10-CM | POA: Diagnosis not present

## 2020-09-02 DIAGNOSIS — Z9641 Presence of insulin pump (external) (internal): Secondary | ICD-10-CM | POA: Diagnosis not present

## 2020-09-02 DIAGNOSIS — I120 Hypertensive chronic kidney disease with stage 5 chronic kidney disease or end stage renal disease: Secondary | ICD-10-CM | POA: Insufficient documentation

## 2020-09-02 DIAGNOSIS — L97521 Non-pressure chronic ulcer of other part of left foot limited to breakdown of skin: Secondary | ICD-10-CM | POA: Insufficient documentation

## 2020-09-02 DIAGNOSIS — Z992 Dependence on renal dialysis: Secondary | ICD-10-CM | POA: Insufficient documentation

## 2020-09-02 DIAGNOSIS — Z87891 Personal history of nicotine dependence: Secondary | ICD-10-CM | POA: Diagnosis not present

## 2020-09-02 DIAGNOSIS — N186 End stage renal disease: Secondary | ICD-10-CM | POA: Insufficient documentation

## 2020-09-02 DIAGNOSIS — M86671 Other chronic osteomyelitis, right ankle and foot: Secondary | ICD-10-CM | POA: Insufficient documentation

## 2020-09-03 NOTE — Progress Notes (Signed)
HETAL, PROANO ([id number]) Visit Report for 09/02/2020 HPI Details Patient Name: Date of Service: [name] 09/02/2020 1:30 PM Medical Record Number: [id number] Patient Account Number: [id number] Date of Birth/Sex: Treating RN: Dec 18, 1970 (50 y.o. [name] Primary Care Provider: [name], [name] Other Clinician: Referring Provider: Treating Provider/Extender: [name], [name] Weeks in Treatment: 104 History of Present Illness HPI Description: When22 year old diabetic who is known to have type 1 diabetes which is poorly controlled last hemoglobin A1c was 11%. She comes in with a ulcerated area on the left lateral foot which has been there for over 6 months. Was recently she has been treated by [name] of podiatry who saw her last on 05/28/2016. Review of his notes revealed that the patient had incision and drainage with placement of antibiotic beads to the left foot on 04/11/2016 for possible osteomyelitis of the cuboid bone. Over the last year she's had a history of amputation of the left fifth toe and a femoropopliteal popliteal bypass graft somewhere in April 2017. 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 His note [name] mentions that the patient has been referred to me for further wound care and possibly great candidate for hyperbaric oxygen therapy due to recurrent osteomyelitis. However we do not have any x-rays of biopsy reports confirming this. He has been on several antibiotics including Bactrim and most recently is on doxycycline for an MRSA. I understand, the patient was not a candidate for IV antibiotics as she has had previous PICC lines which resulted in blood clots in both arms. There was a x-ray report dated 04/04/2016 on [name] notes which showed evidence of fifth ray resection left foot with osteolytic changes noted to the fourth metatarsal and cuboid bone on the left. 06/13/2016 -- had a left foot x-ray which  showed no acute fracture or dislocation and no definite radiographic evidence of osteomyelitis. Advanced osteopenia was seen. 06/20/2016 -- she has noticed a new wound on the right plantar foot in the region where she had a callus before. 06/27/16- the patient did have her x-ray of the right foot which showed no findings to suggest osteomyelitis. She saw her endocrinologist, [name], yesterday. Her A1c in January was 11. He also indicates mismanagement and noncompliance regarding her diabetes. She is currently on Bactrim for a lip infection. She is complaining of nausea, vomiting and diarrhea. She is unable to articulate the exact orders or dosing of the Bactrim; it is unclear when she will complete this. 07/04/2016 -- results from [facility] of ABIs with ankle waveforms were noted from 02/14/2016. The examination done on 06/27/2015 showed noncompressible ABIs with the right being 1.45 and the left being 1.33. The present examination showed a right ABI of 1.19 on the left of 1.33. The conclusion was that right normal ABI in the lower extremity at rest however compared to previous study which was noncompressible ABI may be falsely elevated side suggesting medial calcification. The left ABI suggested medial calcification. 08/01/2016 -- the patient had more redness and pain on her right foot and did not get to come to see as noted she see her PCP or go to the ER and decided to take some leftover metronidazole which she had at home. As usual, the patient does report she feels and is rather noncompliant. 08/08/2016 -- -- x-ray of the right foot -- FINDINGS:Transmetatarsal amputation is noted. No bony destruction is noted to suggest osteomyelitis. IMPRESSION: No evidence of  osteomyelitis. Postsurgical changes are seen. MRI would be more sensitive for possible bony changes. Culture has grown Serratia Marcescens -- sensitive to Bactrim, ciprofloxacin, ceftazidime she was seen by [name] on  08/06/2016. He did not find any exposed bone, muscle, tendon, ligament or joint. There was no malodor and he did a excisional debridement in the office. ============ Old notes: 50 year old patient who is known to the wound clinic for a while had been away from the wound clinic since 09/01/2014. Over the last several months she has been admitted to various hospitals including [facility] [place]. She was treated for a right metatarsal osteomyelitis with a transmetatarsal amputation and this was done about 2 months ago. He has a small ulcerated area on the right heel and she continues to have an ulcerated area on the left plantar aspect of the foot. The patient was recently admitted to the [facility] between 7/12 and 10/18/2014. she was given 3 weeks of IV vancomycin and was to follow-up with her surgeons at [facility] and also took oral vancomycin for C. difficile colitis. 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 MRI of the left foot done on 10/14/2014 showed no abscess or osteomyelitis. 04/27/15; this is a patient we know from previous stays in the wound care center. She is a type I diabetic I am not sure of her control currently. Since the last time I saw her she is had a right transmetatarsal amputation and has no wounds on her right foot and has no open wounds. She is been followed at the wound care center at [facility] in [place]. She comes today with the desire to undergo hyperbaric treatment locally. Apparently one of her wound care providers in [place] has suggested hyperbarics. This is in response to an MRI from 04/18/15 that showed increased marrow signal and loss of the proximal  fifth metatarsal cortex evidence of osteomyelitis with likely early osteomyelitis in the cuboid bone as well. She has a large wound over the base of the fifth metatarsal. She also has a eschar over her the tips of her toes on 1,3 and 5. She does not have peripheral pulses and apparently is going for an angiogram tomorrow which seems reasonable. After this she is going to infectious disease at [facility]. They have been using Medihoney to the large wound on the lateral aspect of the left foot to. The patient has known Charcot deformity from diabetic neuropathy. She also has known diabetic PAD. Surprisingly I can't see that she has had any recent antibiotics, the patient states the last antibiotic she had was at the end of November for 10 days. I think this was in response to culture that showed group G strep although I'm not exactly sure where the culture was from. She is also had arterial studies on 03/29/15. This showed a right ABI of 1.4 that was noncompressible. Her left ABI was 0.73. 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 It was not felt that arterial inflow was adequate for healing of a foot ulcer. Her Doppler waveforms looked monophasic ===== READMISSION 02/28/17; this is in an now 50 year old woman we've had at several different occasions in this clinic. 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 She has  a remote ex-smoker. She was last seen in this clinic by Dr. Con Memos I think in May. More recently she is been followed by her podiatrist [name] an infectious disease Dr. Megan Salon. She has 2 open wounds the major one is over the right first metatarsal head she also has a wound on the left plantar foot. an MRI of the right foot on 01/01/17 showed a soft tissue ulcer along the plantar aspect of the first metatarsal base consistent with osteomyelitis of the first metatarsal stump. Dr. 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 According to the patient this is been open for slightly over a month. She has been on a combination of Cipro 500 twice a day, Zyvox 600 twice a day and Flagyl 500 3 times a day for over a month now as directed by Dr. Megan Salon. cultures of the right foot earlier this year showed MRSA in January and Serratia in May. January also had a few viridans strep. Recent x-rays of both feet were done and [name] office and I don't have these reports. The patient has known PAD and has a history of aleft femoropopliteal bypass in April 2017. 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 Dwyane Dee. In spite of this the last hemoglobin A1c I can see is 10.1 on 01/01/2017. She is being referred by 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 She is been using Medihoney to this area and also an area on the left midfoot. She is using healing sandals bilaterally. ABIs in this clinic at the left posterior tibial was 1.1 noncompressible on the right READMISSION Non invasive vascular NOVANT 5/18 Aftercare following surgery of the circulatory system Procedure Note - Interface, External Ris In - 08/13/2016 11:05 AM EDT Procedure: Examination consists of physiologic resting arterial pressures of the brachial and ankle arteries bilaterally with continuous wave Doppler waveform analysis. Previous: Previous exam performed on 02/14/16 demonstrated ABIs of Rt = 1.19 and Lt = 1.33. Right: ABI = non-compressible PT 1.47 DP. S/P transmet amputation. , Left: ABI = 1.52, 2nd digit pressure = 87 mmHg Conclusions: Right: ABI (>1.3) may be falsely elevated, suggesting medial calcification. Left: ABI (>1.3) may be falsely  elevated, suggesting medial calcification The patient is a now 50 year old type I diabetic is had multiple issues her graded to chronic diabetic foot ulcers. 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 We had her in the clinic lastin November. At that point she had wounds on her bilateral feet.she had wanted to try hyperbarics however the healogics review process denied her because she hadn't followed up with her vascular surgeon for her left femoropopliteal bypass. The bypass was done by Dr. Raul Del at [facility]. We made her a follow-up with Dr. Raul Del however she did not keep the appointment and therefore she was not approved The patient shows me a small wound on her left fourth metatarsal head on her phone. She developed rapid discoloration in the plantar aspect of the left foot and she was admitted to hospital from 2/2 through 05/10/17 with wet gangrene of the left foot osteomyelitis of the fourth metatarsal heads. She was admitted acutely ill with a temperature of 103.  She was started on broad-spectrum vancomycin and cefepime. On 05/06/17 she was taken to the OR by [name] her podiatric surgeon for an incision and drainage irrigation of the left foot wound. Cultures from this surgery revealed group be strep and anaerobes. she was seen by Dr.Xu of orthopedic surgery and scheduled for a below-knee amputation which she u refused. Ultimately she was discharged on Levaquin and Flagyl for one month. MRI 05/05/17 done while she was in the hospital showed abscess adjacent to the fourth metatarsal head and neck small abscess around the fourth flexor tendon. Inflammatory phlegmon and gas in the soft tissues along the lateral aspect of the fourth phalanx. Findings worrisome for osteomyelitis involving the fourth proximal and middle phalanx and also the third and fourth metatarsals. Finally the patient had actually shortly  before this followed up with Dr. Raul Del at no time on 04/29/17. He felt that her left femoropopliteal bypass was patent he felt that her left-sided toe pressures more than adequate for healing a wound on the left foot. This was before her acute presentation. 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 05/28/17; she is started hyperbarics. The patient tells me that for some reason she was not actually on Levaquin but I think on ciprofloxacin. She was on Flagyl. She only started her Levaquin yesterday due to some difficulty with the pharmacy and perhaps her sister picking it up. She has an appointment with [name] tomorrow and with infectious disease early next week. She has no new complaints 06/06/17; the patient continues in hyperbarics. She saw [name] on 05/29/17 who is her podiatric surgeon. He is elected for a transmetatarsal amputation on 06/27/17. I'm not sure at what level he plans to do this amputation. The patient is unaware She also saw Dr. Megan Salon of infectious disease who elected to continue her on current antibiotics I think this is ciprofloxacin and Flagyl. I'll need to clarify with her tomorrow if she actually has this. We're using silver alginate to the actual wound. Necrotic surface today with material under the flap of her foot. Original MRI showed abscesses as well as osteomyelitis of the proximal and middle fourth phalanx and the third and fourth metatarsal heads 06/11/17; patient continues in hyperbarics and continues on oral antibiotics. She is doing well. The wound looks better. The necrotic part of this under the flap in her superior foot also looks better. she is been to see [name]. I haven't had a chance to look at his note. Apparently he has put the transmetatarsal amputation on hold her request it is still planning to take her to the OR for debridement and product application ACEL. I'll see if I can find his note. I'll therefore leave product ordering/requests to [name]  for now. I was going to look at Dermagraft 06/18/17-she is here in follow-up evaluation for bilateral foot wounds. She continues with hyperbaric therapy. She states she has been applying manuka honey to the right plantar foot and alternate manuka honey and silver alginate to the left foot, despite our orders. We will continue with same treatment plan and she will follo up next week. 06/25/17; I have reviewed [name] last note from 3/11. She has operative debridement in 2 days' time. By review his note apparently they're going to place there is skin over the majority of this wound which is a good choice. She has a small satellite area at the most proximal part of this wound on the left plantar foot. The area on the right  plantar foot we've been using silver alginate and it is close to healing. 07/02/17; unfortunately the patient was not easily approved for [name] proposed surgery. I'm not completely certain what the issue is. She has been using silver alginate to the wound she has completed a first course of hyperbarics. She is still on Levaquin and Flagyl. I have really lost track of the time course here.I suspect she should have another week to 2 of antibiotics. I'll need to see if she is followed up with infectious disease Dr. Megan Salon 07/09/17; the patient is followed up with Dr. Megan Salon. 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 She continues on Levaquin and metronidazole continuing both of these for now I think she is been on fr about 6 weeks. She still has some drainage but no pain. No fever. Her had been plans for her to go to the OR for operative debridement with her podiatrist [name], I am not exactly sure where that is. I'll probably slip a note to [name] today. I note that she follows with Dr. Dwyane Dee of endocrinology. We have her recertified for hyperbaric oxygen. I have not heard about Dermagraft however I'll see if [name] is planning a  skin substitute as well 07/16/17; the patient tells me she is just about out of [place]. I'll need to check Dr. Hale Bogus last notes on this. She states she has plenty of Flagyl however. She comes in today complaining of pain in the right lateral foot which she said lasted for about a day. The wound on the right foot is actually much more medially. She also tells me that the [facility] cost a lot of pain in the left foot wound and she turned back to silver alginate. Finally Dermagraft has a $481 per application co-pay. She cannot afford this 07/23/17; patient arrives today with the wound not much smaller. There is not much new to add. She has not heard from [name] all try to put in a call to them today. She was asking about Dermagraft again and she has an over $856 per application co-pay she states that she would be willing to try to do a payment plan. I been tried to avoid this. We've been using silver alginate, I'll change to [facility] 07/30/17-She is here in follow-up evaluation for left foot ulcer. She continues hyperbaric medicine. The left foot ulcer is stable we will continue with same treatment plan 08/06/17; she is here for evaluation of her left foot ulcer. Currently being treated for hyperbarics or underlying osteomyelitis. She is completed antibiotics. The left foot ulcer is better smaller with healthier looking granulation. For various reasons I am not really clear on we never got her back to the OR with [name]. He did not respond to my secure text message. Nevertheless I think that surgery on this point is not necessary nor am I completely clear that a skin substitute is necessary The patient is complaining about pain on the outside of her right foot. 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 There is no erythema. She also states the foot is warm versus her other part of her upper leg and this is largely true. It is not totally clear to me what's causing this. She  thinks it's different from her usual neuropathy pain 08/13/17; she arrives in clinic today with a small wound which is superficial on her right first metatarsal head. 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 She tells [place] she was up on her feet over the Mother's Day celebration. The large wound is on the left foot. Continues with hyperbarics for underlying osteomyelitis. We're using Hydrofera Blue. She asked me today about where we were with Dermagraft. I had actually excluded this because of the co-pay however she wants to assume this therefore I'll recheck the co-pay an order for next week. 08/20/17; the patient agreed to accept the co-pay of the first Apligraf which we applied today. She is disappointed she is finishing hyperbarics will run this through the insurance on the extent of the foot infection and the extent of the wound that she had however she is already had 60 dive's. Dermagraft No. 1 08/27/17; Dermagraft No. 2. She is not eligible for any more hyperbaric treatments this month. She reports a fair amount of drainage and she actually changed to the external dressings without disturbing the direct contact layer 09/03/17; the patient arrived in clinic today with the wound superficially looking quite healthy. Nice vibrant red tissue with some advancing epithelialization although not as much adherence of the flap as I might like. However she noted on her own fourth toe some bogginess and she brought that to our attention. Indeed this was boggy feeling like a possibility of subcutaneous fluid. 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 She is not been unwell. We've been using Dermagraft 09/10/17; the culture that I did not last week was MRSA. She saw Dr. Megan Salon this morning who is going to start her on vancomycin. I had sent him a secure a text message yesterday. I also spoke with her podiatric surgeon  [name] about surgery on this foot the options for conserving a functional foot etc. Promised me he would see her and will make back consultation today. Paradoxically her actual wound on the plantar aspect of her left foot looks really quite good. I had given her 5 days worth of Baxdella to cover her for MRSA. Her MRI came back showing osteomyelitis within the third metatarsal shaft and head and base of the third and fourth proximal phalanx. She had extensive inflammatory changes throughout the soft tissue of the lateral forefoot. With an ill-defined fluid around the fourth metatarsal extending into the plantar and dorsal soft tissues 09/19/17; the patient is actually on oral Septra and Flagyl. She apparently refused IV vancomycin. She also saw [name] at my request who is planning her for a left BKA sometime in mid July. MRI showed osteomyelitis within the third metatarsal shaft and head and the basis of the third and fourth proximal phalanx. I believe there was felt to be possible septic arthritis involving the third MTP. 09/26/17; the patient went back to Dr. Megan Salon at my suggestion and is now receiving IV daptomycin. Her wound continues to look quite good making the decision to proceed with a transmetatarsal amputation although more difficult for the patient. I believe in my extensive discussions with her she has a good sense of the pros and cons of this. I don't NV the tuft decision she has to make. She has an appointment with [name] I believe in mid July and I previously spoken to him about this issue Has we had used 3 previous Dermagraft. Given the condition of the wound surface I went ahead and added the fourth one today area and I did this not fully realizing that she'll be traveling  to [place] next week. I'm hopeful she can come back in 2 weeks 10/21/17; Her same Dermagraft on for about 3-1/2 weeks. In spite of this the wound arrives looking quite healthy. There is been a lot of  healing dimensions are smaller. Looking at the square shaped wound she has now there is some undermining and some depth medially under the undermining although I cannot palpate any bone. No surrounding infection is obvious. She has difficult questions about how to look at this going forward vis--vis amputations versus continued medical therapy. T be truthful the wound is looks o so healthy and it is continued to contract. Hard to justify foot surgery at this point although I still told her that I think it might come to that if we are not able to eradicate the underlying MRSA. She is still highly at risk and she understands this 11/06/17 on evaluation today patient appears to be doing better in regard to her foot ulcer. She's been tolerating the dressing changes without complication. Currently she is here for her Dermagraft #6. Her wound continues to make excellent progress at this point. She does not appear to have any evidence of infection which is good news. 11/13/17 on evaluation today patient appears to be doing excellent at this time. She is here for repeat Dermagraft application. This is #7. Overall her wound seems to be making great progress. 12/05/17; the patient arrives with the wound in much better condition than when I last saw this almost 6 weeks ago. She still has a small probing area in the left metatarsal head region on the lateral aspect of her foot. We applied her last Dermagraft today. Since the last time she is here she has what appears to a been a blood blister on the plantar aspect of left foot although I don't see this is threatening. There is also a thick raised tissue on the right mid metatarsal head region. This was not there I don't think the last time she was here 3 weeks ago. 12/12/17; the patient continues to have a small programming area in the left metatarsal head region on the lateral aspect of her foot which was the initial large surgical wound. I applied her last Apligraf  last week. I'm going to use Endoform starting today Unfortunately she has an excoriated area in the left mid foot and the right mid foot. The left midfoot looks like a blistered area this was not opened last week it certainly is open today. Using silver alginate on these areas. She promises me she is offloading this. 12/19/17; the small probing area in the left metatarsal head eyes think is shallower. In general her original wound looks better. We've been using Endoform. The area inferiorly that I think was trauma last week still requires debridement a lot of nonviable surface which I removed. She still has an open open area distally in her foot Similarly on the right foot there is tightly adherent surface debris which I removed. Still areas that don't look completely epithelialized. This is a small open area. We used silver alginate on these areas 12/26/2017; the patient did not have the supplies we ordered from last week including the Endoform. The original large wound on the left lateral foot looks healthy. She still has the undermining area that is largely unchanged from last week. She has the same heavily callused raised edged wounds on the right mid and left midfoot. Both of these requiring debridement. We have been using silver alginate on these areas 01/02/2018;  there is still supply issues. We are going to try to use Prisma but I am not sure she actually got it from what she is saying. She has a new open area on the lateral aspect of the left fourth toe [previous fifth ray amputation]. Still the one tunneling area over the fourth metatarsal head. The area is in the midfoot bilaterally still have thick callus around them. She is concerned about a raised swelling on the lateral aspect of the foot. However she is completely insensate 01/10/2018; we are using Prisma to the wounds on her bilateral feet. Surprisingly the tunneling area over the left fourth metatarsal head that was part of  her original surgery has closed down. She has a small open area remaining on the incision line. 2 open areas in the midfoot. 02/10/2018; the patient arrives back in clinic after a month hiatus. She was traveling to visit family in [place]. Is fairly clear she was not offloading the areas on her feet. The original wound over the left lateral foot at the level of metatarsal heads is reopened and probes medially by about a centimeter or 2. She notes that a week ago she had purulent drainage come out of an area on the left midfoot. Paradoxically the worst area is actually on the right foot is extensive with purulent drainage. We will use silver alginate today 02/17/2018; the patient has 3 wounds one over the left lateral foot. She still has a small area over the metatarsal heads which is the remnant of her original surgical wound. This has medial probing depth of roughly 1.4 cm somewhat better than last week. The area on the right foot is larger. We have been using silver alginate to all areas. The area on the right foot and left foot that we cultured last week showed both Klebsiella and Proteus. Both of these are quinolone sensitive. The patient put her's self on Bactrim and Flagyl that she had left hanging around from prior antibiotic usages. She was apparently on this last week when she arrived. I did not realize this. Unfortunately the Bactrim will not cover either 1 of these organisms. We will send in Cipro 500 twice daily for a week 03/04/2018; the patient has 2 wounds on the left foot one is the original wound which was a surgical wound for a deep DFU. At one point this had exposed bone. She still has an area over the fourth metatarsal head that probes about 1.4 cm although I think this is better than last week. I been using silver nitrate to try and promote tissue adherence and been using silver alginate here. She also has an area in the left midfoot. This has some depth but a small linear wound.  Still requiring debridement. On the right midfoot is a circular wound. A lot of thick callus around this area. We have been using silver alginate to all wound areas She is completed the ciprofloxacin I gave her 2 weeks ago. 03/11/2018; the patient continues to have 2 open areas on the left foot 1 of which was the original surgical wound for a deep DFU. Only a small probing area remains although this is not much different from last week we have been using silver alginate. The other area is on the midfoot this is smaller linear but still with some depth. We have been using silver alginate here as well On the right foot she has a small circular wound in the mid aspect. This is not much smaller than last  time. We have been using silver alginate here as well 03/18/2018; she has 3 wounds on the left foot the original surgical wound, a very superficial wound in the mid aspect and then finally the area in the mid plantar foot. She arrives in today with a very concerning area in the wound in the mid plantar foot which is her most proximal wound. There is undermining here of roughly 1-1/2 cm superiorly. Serosanguineous drainage. She tells me she had some pain on for over the weekend that shot up her foot into her thigh and she tells me that she had a nodule in the groin area. She has the single wound in the right foot. We are using endoform to both wound areas 03/24/2018; the patient arrives with the original surgical wound in the area on the left midfoot about the same as last week. There is a collection of fluid under the surface of the skin extending from the surgical wound towards the midfoot although it does not reach the midfoot wound. The area on the right foot is about the same. Cultures from last week of the left midfoot wound showed abundant Klebsiella abundant Enterococcus faecalis and moderate methicillin resistant staph I gave her Levaquin but this would have only covered the Klebsiella. She  will need linezolid 04/01/2018; she is taking linezolid but for the first few days only took 1 a day. I have advised her to finish this at twice daily dosing. In any case all of her wounds are a lot better especially on the left foot. The original surgical wound is closed. The area on the left midfoot considerably smaller. The area on the right foot also smaller. 04/08/2018; her original surgical wound/osteomyelitis on the left foot remains closed. She has area on the left foot that is in the midfoot area but she had some streaking towards this. This is not connected with her original wound at least not visually. Small wound on the right midfoot appears somewhat smaller. 04/15/18; both wounds looks better. Original wound is better left midfoot. Using silver alginate 1/21; patient states she uses saltwater soak in, stones or remove callus from around her wounds. She is also concerned about a blood blister she had on the left foot but it simply resolved on its own. We've been using silver alginate 1/28; the patient arrives today with the same streaking area from her metatarsals laterally [the site of her original surgical wound] down to the middle of her foot. There is some drainage in the subcutaneous area here. This concerns me that there is actually continued ongoing infection in the metatarsals probably the fourth and third. This fixates an MRI of the foot without contrast [chronic renal failure] The wound in the mid part of the foot is small but I wonder whether this area actually connects with the more distal foot. The area on the right midfoot is probably about the same. Callus thick skin around the small wound which I removed with a curette we have been using silver alginate on both wound areas 2/4; culture I did of the draining site on the left foot last time grew methicillin sensitive staph aureus. MRI of the left foot showed interval resolution of the findings surrounding the third metatarsal  joint on the prior study consistent with treated osteomyelitis. Chronic soft tissue ulceration in the plantar and lateral aspect of the forefoot without residual focal fluid collection. No evidence of recurrent osteomyelitis. Noted to have the previous amputation of the distal first phalanx and fifth  ray MRI of the right foot showed no evidence of osteomyelitis I am going to treat the patient with a prolonged course of antibiotics directed against MSSA in the left foot 2/11; patient continues on cephalexin. She tells me she had nausea and vomiting over the weekend and missed 2 days. In general her foot looks much the same. She has a small open area just below the left fourth metatarsal head. A linear area in the left midfoot. Some discoloration extending from the inferior part of this into the left lateral foot although this appears to be superficial. She has a small area on the right midfoot which generally looks smaller after debridement 2/18; the patient is completing his cephalexin and has another 2 days. She continues to have open areas on the left and right foot. 2/25; she is now off antibiotics. The area on the left foot at the site of her original surgical wound has closed yet again. She still has open areas in the mid part of her foot however these appear smaller. The area on the right mid foot looks about the same. We have been using silver alginate She tells me she had a serious hypoglycemic spell at home. She had to have EMS called and get IV dextrose 3/3; disappointing on the left lateral foot large area of necrotic tissue surrounding the linear area. This appears to track up towards the same original surgical wound. Required extensive debridement. The area on the right plantar foot is not a lot better also using silver 3/12; the culture I did last time showed abundant enterococcus. I have prescribed Augmentin, should cover any unrecognized anaerobes as well. In addition there were a few  MRSA and Serratia that would not be well covered although I did not want to give her multiple antibiotics. She comes in today with a new wound in the right midfoot this is not connected with the original wound over her MTP a lot of thick callus tissue around both wounds but once again she said she is not walking on these areas 3/17-Patient comes in for follow-up on the bilateral plantar wounds, the right midfoot and the left plantar wound. Both these are heavily callused surrounding the wounds. We are continuing to use silver alginate, she is compliant with offloading and states she uses a wheelchair fairly often at home 3/24; both wound areas have thick callus. However things actually look quite a bit better here for the majority of her left foot and the right foot. 3/31; patient continues to have thick callused somewhat irritated looking tissue around the wounds which individually are fairly superficial. There is no evidence of surrounding infection. We have been using silver alginate however I change that to [facility] today 4/17; patient returns to clinic after having a scare with Covid she tested negative in her primary doctor's office. She has been using Hydrofera Blue. She does not have an open area on the right foot. On the left foot she has a small open area with the mid area not completely viable. She showed me pictures of what looks like a hemorrhagic blister from several days ago but that seems to have healed over this was on the lateral left foot 4/21; patient comes in to clinic with both her wounds on her feet closed. However over the weekend she started having pain in her right foot and leg up into the thigh. She felt as though she was running a low-grade fever but did not take her temperature. She took a  doxycycline that she had leftover and yesterday a single Septra and metronidazole. She thinks things feel somewhat better. 4/28; duplex ultrasound I ordered last week was negative  for DVT or superficial thrombophlebitis. She is completed the doxycycline I gave her. States she is still having a lot of pain in the right calf and right ankle which is no better than last week. She cannot sleep. She also states she has a temperature of up to 101, coughing and complaining of visual loss in her bilateral eyes. Apparently she was tested for Covid 2 weeks ago at [facility] and that was negative. Readmission: 09/03/18 patient presents back for reevaluation after having been evaluated at the end of April regarding erythema and swelling of her right lower extremity. Subsequently she ended up going to the hospital on 07/29/18 and was admitted not to be discharged until 08/08/18. Unfortunately it was noted during the time that she was in the hospital that she did have methicillin-resistant Staphylococcus aureus as the infection noted at the site. It was also determined that she did have osteomyelitis which appears to be fairly significant. She was treated with vancomycin and in fact is still on IV vancomycin at dialysis currently. This is actually slated to continue until 09/12/18 at least which will be the completion of the six weeks of therapy. Nonetheless based on what I'm seeing at this point I'm not sure she will be anywhere near ready to discontinue antibiotics at that time. Since she was released from the hospital she was seen by [name] who is her podiatrist on 08/27/18. His note specifically states that he is recommended that the patient needs of one knee amputation on the right as she has a life- threatening situation that can lead quickly to sepsis. The patient advised she would like to try to save her leg to which [name] apparently told her that this was against all medical advice. She also want to discontinue the Wound VAC which had been initiated due to the fact that she wasn't pleased with how the wound was looking and subsequently she wanted to pursue applying Medihoney at that time.  He stated that he did not believe that the right lower extremity was salvageable and that the patient understood but would still like to attempt hyperbaric option therapy if it could be of any benefit. She was therefore referred back to [place] for further evaluation. He plans to see her back next week. Upon inspection today patient has a significant amount purulent drainage noted from the wound at this point. The bone in the distal portion of her foot also appears to be extremely necrotic and spongy. When I push down on the bone it bubbles and seeps purulent drainage from deeper in the end of the foot. I do not think that this is likely going to heal very well at all and less aggressive surgical debridement were undertaken more than what I believe we can likely do here in our office. 09/12/2018; I have not seen this patient since the most recent hospitalization although she was in our clinic last week. I have reviewed some of her records from a complex hospitalization. 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 There is situation was complicated by MRSA bacteremia and acute on chronic renal failure now on dialysis. She is receiving vancomycin at dialysis. We started her on Dakin's wet-to-dry last week she is changing this daily. There is still purulent drainage coming out of her foot.  Although she is apparently "agreeable" to a below-knee amputation which is been suggested by multiple clinicians she wants this to be done in [place]. She apparently has a telehealth visit with that provider sometime in late Tye 6/24. I have told her I think this is probably too long. Nevertheless I could not convince her to allow a local doctor to perform BKA. 09/19/2018; the patient has a large necrotic area on the right anterior foot. She has had previous transmetatarsal amputations. Culture I did last week showed MRSA nothing else she is on vancomycin at dialysis. She  has continued leaking purulent drainage out of the distal part of the large circular wound on the right anterior foot. She apparently went to see Dr. 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 Somehow that translated into her being referred to plastic surgery for debridement of the area. I gather she basically refused amputation although I do not have a copy of Dr. Berenice Primas notes. The patient really wants to have a trial of hyperbaric oxygen. I agreed with initial assessment in this clinic that this was probably too far along to benefit however if she is going to have plastic surgery I think she would benefit from ancillary hyperbaric oxygen. The issue here is that the patient has benefited as maximally as any patient I have ever seen from hyperbaric oxygen therapy. Most recently she had exposed bone on the lateral part of her left foot after a surgical procedure and that actually has closed. She has eschared areas in both heels but no open area. She is remained systemically well. I am not optimistic that anything can be done about this but the patient is very clear that she wants an attempt. The attempt would include a wound VAC further debridements and hyperbaric oxygen along with IV antibiotics. 6/26; I put her in for a trial of hyperbaric oxygen only because of the dramatic response she has had with wounds on her left midfoot earlier this year which was a surgical wound that went straight to her bone over the metatarsal heads and also remotely the left third toe. We will see if we can get this through our review process and insurance. She arrives in clinic with again purulent material pouring out of necrotic bone on the top of the foot distally. There is also some concerning erythema on the front of the leg that we marked. It is bit difficult to tell how tender this is because of neuropathy. I note from infectious disease that she had her vancomycin extended. All the  cultures of these areas have shown MRSA sensitive to vancomycin. She had the wound VAC on for part of the week. The rest of the time she is putting various things on this including Medihoney, "ionized water" silver sorb gel etc. 7/7; follow-up along with HBO. She is still on vancomycin at dialysis. She has a large open area on the dorsal right foot and a small dark eschar area on her heel. There is a lot less erythema in the area and a lot less tenderness. From an infection point of view I think this is better. She still has a lot of necrosis in the remaining right forefoot [previous TMA] we are still using the wound VAC in this area 7/16; follow-up along with HBO. I put her on linezolid after she finished her vancomycin. We started this last Friday I gave her 2 weeks worth. I had the expectation that she would be operatively debrided by Dr. Marla Roe  but that still has not happened yet. Patient phoned the office this week. She arrives for review today after HBO. The distal part of this wound is completely necrotic. Nonviable pieces of tendon bone was still purulent drainage. Also concerning that she has black eschar over the heel that is expanding. I think this may be indicative of infection in this area as well. She has less erythema and warmth in the ankle and calf but still an abnormal exam 7/21 follow-up along with HBO. I will renew her linezolid after checking a CBC with differential monitoring her blood counts especially her platelets. She was supposed to have surgery yesterday but if I am reading things correctly this was canceled after her blood sugar was found to be over 500. I thought Dr. Marla Roe who called me said that they were sending her to the ER but the patient states that was not the case. 7/28. Follow-up along with HBO. She is on linezolid I still do not have any lab work from dialysis even though I called last week. The patient is concerned about an area on her left lateral  foot about the level of the base of her fifth metatarsal. I did not really see anything that ominous here however this patient is in [place] ability to point out problems that she is sensing and she has been accurate in the past Finally she received a call from Dr. Marla Roe who is referring her to another orthopedic surgeon stating that she is too booked up to take her to the operating room now. Was still using a wound VAC on the foot 8/3 -Follow-up after HBO, she is got another week of linezolid, she is to call ID for an appointment, x-rays of both feet were reviewed, the left foot x-ray with third MTP joint osteo- Right foot x-ray widespread osteo-in the right midfoot Right ankle x-ray does not show any active evidence of infection 8/11-Patient is seen after HBO, the wounds on the right foot appear to be about the same, the heel wound had some necrotic base over tendon that was debrided with a curette 8/21; patient is seen after HBO. The patient's wound on her dorsal foot actually looks reasonably good and there is substantial amount of epithelialization however the open area distally still has a lot of necrotic debris partially bone. I cannot really get a good sense of just how deep this probes under the foot. She has been pressuring me this week to order medical maggots through a company in [place] for her. The problem I have is there is not a defined wound area here. On the positive side there is no purulence. She has been to see infectious disease she is still on Septra DS although I have not had a chance to review their notes 8/28; patient is seen in conjunction with HBO. The wounds on her foot continued to improve including the right dorsal foot substantially the, the distal part of this wound and the area on the right heel. We have been using a wound VAC over this chronically. She is still on trimethoprim as directed by infectious disease 9/4; patient is seen in conjunction with HBO.  Right dorsal foot wound substantially anteriorly is better however she continues to have a deep wound in the distal part of this that is not responding. We have been using silver collagen under border foam Area on the right plantar medial heel seems better. We have been using Hydrofera Blue 12/12/18 on evaluation today patient appears to be  doing about the same with regard to her wound based on prior measurements. She does have some necrotic tissue noted on the lateral aspect of the wound that is going require a little bit of sharp debridement today. This includes what appears to be potentially either severely necrotic bone or tendon. Nonetheless other than that she does not appear to have any severe infection which is good news 9/18; it is been 2 weeks since I saw this wound. She is tolerating HBO well. Continued dramatic improvement in the area on the right dorsal foot. She still has a small wound on the heel that we have been using Hydrofera Blue. She continues with a wound VAC 9/24; patient has to be seen emergently today with a swelling on her right lateral lower leg. She says that she told Dr. Evette Doffing about this and also myself on a couple of occasions but I really have no recollection of this. She is not systemically unwell and her wound really looked good the last time I saw this. She showed this to providers at dialysis and she was able to verify that she was started on cephalexin today for 5 doses at dialysis. She dialyzes on Tuesday Thursday and Saturday. 10/2; patient is seen in conjunction with HBO. The area that is draining on the right anterior medial tibia is more extensive. Copious amounts of serosanguineous drainage with some purulence. We are still using the wound VAC on the original wound then it is stable. Culture I did of the original IandD showed MRSA I contacted dialysis she is now on vancomycin with dialysis treatments. I asked them to run a month 10/9; patient seen in  conjunction with HBO. She had a new spontaneous open area just above the wound on the right medial tibia ankle. More swelling on the right medial tibia. Her wound on the foot looks about the same perhaps slightly better. There is no warmth spreading up her leg but no obvious erythema. her MRI of the foot and ankle and distal tib-fib is not booked for next Friday I discussed this with her in great detail over multiple days. it is likely she has spreading infection upper leg at least involving the distal 25% above the ankle. She knows that if I refer her to orthopedics for infectious disease they are going to recommend amputation and indeed I am not against this myself. We had a good trial at trying to heal the foot which is what she wanted along with antibiotics debridement and HBO however she clearly has spreading infection [probably staph aureus/MRSA]. Nevertheless she once again tells me she wants to wait the left of the MRI. She still makes comments about having her amputation done in [place]. 10/19; arrives today with significant swelling on the lateral right leg. Last culture I did showed Klebsiella. Multidrug-resistant. Cipro was intermediate sensitivity and that is what I have her on pending her MRI which apparently is going to be done on Thursday this week although this seems to be moving back and forth. She is not systemically unwell. We are using silver alginate on her major wound area on the right medial foot and the draining areas on the right lateral lower leg 10/26; MRI showed extensive abscess in the anterior compartment of the right leg also widespread osteomyelitis involving osseous structures of the midfoot and portions of the hindfoot. Also suspicion for osteomyelitis anterior aspect of the distal medial malleolus. Culture I did of the purulence once again showed a multidrug-resistant Klebsiella. I have been  in contact with nephrology late last week and she has been started on  cefepime at dialysis to replace the vancomycin We sent a copy of her MRI report to Dr. Geroge Baseman in [place] who is an orthopedic surgeon. The patient takes great stock in his opinion on this. She says she will go to [place] to have her leg amputated if Dr. Geroge Baseman does not feel there is any salvage options. 11/2; she still is not talk to her orthopedic surgeon in [place]. Apparently he will call her at [phone] this afternoon. The quality of this is she has not allowed me to refer her anywhere. She has been told over and over that she needs this amputated but has not agreed to be referred. She tells me her blood sugar was 600 last night but she has not been febrile. 11/9; she never did got a call from the orthopedic surgeon in [place] therefore that is off the radar. We have arranged to get her see orthopedic surgery at [facility]. She still has a lot of draining purulence coming out of the new abscess in her right leg although that probably came from the osteomyelitis in her right foot and heel. Meanwhile the original wound on the right foot looks very healthy. Continued improvement. The issue is that the last MRI showed osteomyelitis in her right foot extensively she now has an abscess in the right anterior lower leg. There is nobody in [place] who will offer this woman anything but an amputation and to be honest that is probably what she needs. I think she still wants to talk about limb salvage although at this point I just do not see that. She has completed her vancomycin at dialysis which was for the original staph aureus she is still on cefepime for the more recent Klebsiella. She has had a long course of both of these antibiotics which should have benefited the osteomyelitis on the right foot as well as the abscess. 11/16; apparently [place] elective surgery is shut down because of COVID-19 pandemic. I have reached out to some contacts at [facility] to see if we can get her an  orthopedic appointment there. I am concerned about continually leaving this but for the moment everything is static. In fact her original large wound on this foot is closing down. It is the abscess on the right anterior leg that continues to drain purulent serosanguineous material. She is not currently on any antibiotics however she had a prolonged course of vancomycin [1 month] as well as cefepime for a month 02/24/2019 on evaluation today patient appears to be doing better than the last time I saw her. This is not a patient that I typically see. With that being said I am covering for Dr. Dellia Nims this week and again compared to when I last saw her overall the wounds in particular seem to be doing significantly better which is good news. With that being said the patient tells me several disconcerting things. She has not been able to get in to see anyone for potential debridement in regard to her leg wounds although she tells me that she does not think it is necessary any longer because she is taking care of that herself. She noticed a string coming out of the lower wound on her leg over the last week. The patient states that she subsequently decided that we must of pack something in there and started pulling the string out and as it kept coming and coming she realized this was likely her  tendon. With that being said she continued to remove as much of this as she could. She then I subsequently proceeded to using tubes of antibiotic ointment which she will stick down into the wound and then scored as much as she can until she sees it coming out of the other wound opening. She states that in doing this she is actually made things better and there is less redness and irritation. With regard to her foot wound she does have some necrotic tendon and tissue noted in one small corner but again the actual wound itself seems to be doing better with good granulation in general compared to my last evaluation. 12/7;  continued improvement in the wound on the substantial part of the right medial foot. Still a necrotic area inferiorly that required debridement but the rest of this looks very healthy and is contracting. She has 2 wounds on the right lateral leg which were her original drainage sites from her abscess but all of this looks a lot better as well. She has been using silver alginate after putting antibiotic biotic ointment in one wound and watching it come out the other. I have talked to her in some detail today. I had given her names of orthopedic surgeons at [facility] for second opinion on what to do about the right leg. I do not think the patient never called them. She has not been able to get a hold of the orthopedic surgeon in [place] that she had put a lot of faith in as being somebody would give her an opinion that she would trust. I talked to her today and said even if I could get her in to another orthopedic surgeon about the leg which she accept an amputation and she said she would not therefore I am not going to press this issue for the moment 12/14; continued improvement in his substantial wound on the right medial foot. There is still a necrotic area inferiorly with tightly adherent necrotic debris which I have been working on debriding each time she is here. She does not have an orthopedic appointment. Since last time she was here I looked over her cultures which were essentially MRSA on the foot wound and gram-negative rods in the abscess on the anterior leg. 12/21; continued improvement in the area on the right medial foot. She is not up on this much and that is probably a good thing since I do not know it could support continuous ambulation. She has a small area on the right lateral leg which were remanence of the IandD's I did because of the abscess. I think she should probably have prophylactic antibiotics I am going to have to look this over to see if we can make an intelligent  decision here. In the meantime her major wound is come down nicely. Necrotic area inferiorly is still there but looks a lot better 04/06/2019; she has had some improvement in the overall surface area on the right medial foot somewhat narrowedr both but somewhat longer. The areas on the right lateral leg which were initial IandD sites are superficial. Nothing is present on the right heel. We are using silver alginate to the wound areas 1/18; right medial foot somewhat smaller. Still a deep probing area in the most distal recess of the wound. She has nothing open on the right leg. She has a new wound on the plantar aspect of her left fourth toe which may have come from just pulling skin. The patient using Medihoney on  the wound on her foot under silver alginate. I cannot discourage her from this 2/1; 2-week follow-up using silver alginate on the right foot and her left fourth toe. The area on the right dorsal foot is contracted although there is still the deep area in the most distal part of the wound but still has some probing depth. No overt infection 2/15; 2-week follow-up. She continues to have improvement in the surface area on the dorsal right foot. Even the tunneling area from last time is almost closed. The area that was on the plantar part of her left fourth toe over the PIP is indeed closed 3/1; 2-week follow-up. Continued improvement in surface area. The original divot that we have been debriding inferiorly I think has full epithelialization although the epithelialization is gone down into the wound with probably 4 mm of depth. Even under intense illumination I am unable to see anything open here. The remanence of the wound in this area actually look quite healthy. We have been using silver alginate 3/15; 2-week follow-up. Unfortunately not as good today. She has a comma shaped wound on the dorsal foot however the upper part of this is larger. Under illumination debris on the surface She also  tells [place] that she was on her right leg 2 times in the last couple of weeks mostly to reach up for things above her head etc. She felt a sharp pain in the right leg which she thinks is somewhere from the ankle to the knee. The patient has neuropathy and is really uncertain. She cannot feel her foot so she does not think it was coming from there 3/29; 2-week follow-up. Her wound measures smaller. Surface of the wound appears reasonable. She is using silver alginate with underlying Medihoney. She has home health. X-rays I did of her tib-fib last time were negative although it did show arterial calcification 4/12; 2-week follow-up. Her wound measures smaller in length. Using manuka honey with silver alginate on top. She has home health. 4/26; 2-week follow-up. Her wound is smaller but still very adherent debris under illumination requiring debridement she has been using manuka honey with silver alginate. 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 Been using Hydrofera Blue. She is on suppressive Bactrim but apparently she has been taking it twice daily 6/7; I have not seen this wound and about 6 weeks. Since then she was up in [place]. By her own admission she was walking on the foot because she did not have a wheelchair. The wound is not nearly as healthy looking as it was the last time I saw this. We ordered different things for her but she only uses Medihoney and silver alginate. As far as I know she is on suppressive trimethoprim sulfamethoxazole. She does not admit to any fever or chills. Her CBGs apparently are at baseline however she is saying that she feels some discomfort on the lateral part of her ankle I looked over her last inflammatory markers from the summer 2020 at which time she had a deeply necrotic infected wound in this area. On 11/10/2018 her sedimentation rate was 56 and C-reactive  protein 9.9. This was 107 and 29 on 07/29/2018. 6/17; the patient had a necrotic wound the last time she was here on the right dorsal foot. After debridement I did a culture. This showed a very resistant ESBL Klebsiella as well as Enterococcus. Her  x-ray of the foot which was done because of warmth and some discomfort showed bone destruction within the carpal bones involving the navicular acute cuboid lateral middle cuneiforms but essentially unchanged from her prior study which was done on 10/29/2018. The findings were felt to represent chronic osteomyelitis. We did inflammatory markers on her. Her white count was 5.25 sedimentation rate 16 and C-reactive protein at 11.1. Notable for the fact that in August 2020 her CRP was 9.9 and sedimentation rate 56. I have looked at her x-rays. It is true that the bone destruction is very impressive however the patient came into this clinic for the wound on her right foot with pieces of bone literally falling out anteriorly with purulent material. I am not exactly sure I could have expected anything different. She has not been systemically unwell no fever chills or blood sugars have been reasonable. 6/28; she arrives with a right heel closed. The substantial area on the right anterior foot looks healthy. Much better looking surface. I think we can change to [facility] seems to help this previously. She is getting her antibiotics at dialysis she should be just about finished 7/9; changed to [facility] last week. Surface wound looks satisfactory not much change in surface area however. She is going to [place] state next week this is usually a difficult thing for this patient follow-up will be for 2 weeks. 7/23; using Hydrofera Blue. She returns from her trip and the wound looks surprisingly good. Usually when this patient goes on trips she comes back with a lot of problems with the wound. She is saying that she sometimes feels an episodic "crunching"  feeling on the lateral part of the foot. She is neuropathic and not feeling pain but wonders whether this could be a neuropathic dysesthesia. 11/13/19-Patient returns after 3 weeks, the wound itself is stable and patient states that there is nothing new going on she is on some extra anxiety medications and is resisting the temptation to pick at the dry skin around the wound. 9/20; patient has not been here in over a month and I have not seen her in 2 months. The wound in terms of size I think is about the same. There is no exposed bone. She has a nonviable surface on this. She is supposed to be using [facility] however she is also been using some form of honey preparation as well as a silver-based dressings. I do not think she has any pattern to this. 10/4; 2-week follow-up. Patient has been using some form of spray which she says has honey and silver to purchase this online she has been covering it with gauze. In spite of this the wound actually looks quite good. The deeper divot distally appears to be close down. There is a rim of epithelialization. 10/18; 2-week follow-up. Patient has been using her Hydrofera Blue covered with her silver honey spray that she got online. 11/1; 2-week follow-up. She is using Hydrofera Blue with a silver honey spray. Wound bed is measuring smaller. She has noticed that her foot is warmer on the right. She is concerned about infection. For a long period of time I had her on prophylactic trimethoprim sulfamethoxazole DS 1 tablet daily. She is asking for this to be restarted. The patient is walking on this foot because of repairs that are being done in a home her but her room is on the second floor she has to go up and down stairs. I have cautioned against this however as  usual she will do exactly what she wants to do 11/15; 2-week follow-up. She uses Hydrofera Blue with a silver/honey spray which I have never heard of. I think her wound looks about the same.  Some epithelialization. No evidence that this is infected. I think she is walking on this more than we agreed on. She is going on extensive vacation over Thanksgiving 12/3; 2-week follow-up. She is using Hydrofera Blue however over Thanksgiving she ran out of this and she is simply been using Medihoney. In spite of this her wound is smaller almost divided into 2 now. She traveled extensively over Thanksgiving and actually looks quite good in spite of this. Usually this is been a marker of problems for her 12/17; 2-week follow-up. She is using Hydrofera Blue. The wound is smaller. Debris on the surface of this is fibrinous. She is traveling to [place] over the holidays which never bodes well for her wounds. I think she is walking more on her feet then she is even willing to admit and she tells me she does walk 2/11; using Hydrofera Blue. Her wounds are contracting however she walks in the clinic with a history that she has not been able to eat she has had vomiting. She also has right eye problems. Her blood sugar was 567. She had blood work done at dialysis and we called there to get her blood work although it still had not returned although they should have this by the end of the day we were told. She also stated that she was not sure what her blood sugar was as her glucose monitor was not working and not coming to tomorrow. She also for some reason does not think her insulin pump is working well. Her endocrinologist is Dr. Elayne Snare 06/03/2020 upon evaluation today patient actually appears to be doing excellent in regard to her wound. In fact she tells me it was not even bleeding until she picked a dry piece of skin off while we were getting ready to come in and see her today. Fortunately there is no signs of active infection which is great news and overall very pleased. She is going to be seeing her neurologist sometime shortly I believe it might even be on Monday. Nonetheless there can proceed  with the work-up as far as anything else going on with her eye the fact that her right eyelid is drooping. 3/25; right medial dorsal foot. She has superficial areas here that appear to be fully epithelialized she is using Medihoney and silver alginate and some combination. She has a new area on the mid left foot at roughly the level of the fifth metatarsal base 4/84/8; right medial dorsal foot. Still superficial areas here. I cleaned up 1 of these today she has been using I think mostly Medihoney but I would like her to use silver alginate. The area on the mid part of her foot also looks improved on the left. Since she was last here she tells me that she noted pus in the area of her left foot. She told dialysis about this they did a culture and ultimately she is on IV antibiotics but were not sure which one. They referred her to [name] he said that he did not need to follow her. I have not really verified any of this and I do not know what antibiotic she is on at dialysis 4/29; patient has been using Medihoney to her wounds. She reports taking off the toenail to her left second toe.  She states that home health discharged her and she has not had help with dressing changes. She denies any signs and symptoms of infection. 5/13; 2-week follow-up. Miraculously the wound on the right foot dorsally is healed. She has an area on the tip of her left third toe and an area in the left midfoot. 5/26; patient presents for 2-week follow-up. She has 2 open wounds 1 to her plantar foot and the other to the left third toe. She has noticed increased swelling and redness to the toe wound. It is unclear exactly what she is doing for dressing changes as she has multiple dressing options at home. She states that it is easiest to do [facility] on the plantar wound and Prisma or Medihoney on the toe wound. 6/3; saw Dr. Maryjane Hurter week. She put her on Keflex because of the swelling I think of the left second toe. She  arrives today with new breakdown x3 on the dorsal right foot which is disappointing. She also complains of pain in the right ankle, 3 liquid stools per day, chills without fever Electronic Signature(s) Signed: 09/03/2020 7:13:41 AM By: Linton Ham MD Entered By: Linton Ham on 09/02/2020 15:43:00 -------------------------------------------------------------------------------- Physical Exam Details Patient Name: Date of Service: [name] 09/02/2020 1:30 PM Medical Record Number: [id number] Patient Account Number: [id number] Date of Birth/Sex: Treating RN: 1970/11/05 (50 y.o. [name] Primary Care Provider: [name], [name] Other Clinician: Referring Provider: Treating Provider/Extender: [name], [name] Weeks in Treatment: 104 Constitutional Patient is hypertensive.. Pulse regular and within target range for patient.Marland Kitchen Respirations regular, non-labored and within target range.. Temperature is normal and within the target range for the patient.Marland Kitchen Appears in no distress. Respiratory work of breathing is normal. Integumentary (Hair, Skin) I checked her shunt site in the left arm no erythema no evidence of infection. Psychiatric appears at normal baseline. Notes Wound exam Left plantar foot again eschared surface I did not remove this. Left third toe swollen and red its been like this for about 2 or 3 weeks. There is no tenderness in the joint. However this looks infected On the right dorsal foot she has 3 open areas in the original wound area Electronic Signature(s) Signed: 09/03/2020 7:13:41 AM By: Linton Ham MD Entered By: Linton Ham on 09/02/2020 15:44:30 -------------------------------------------------------------------------------- Physician Orders Details Patient Name: Date of Service: [place] Y, [name] 09/02/2020 1:30 PM Medical Record Number: [id number] Patient Account Number: [id number] Date of Birth/Sex: Treating  RN: May 13, 1970 (50 y.o. [name] Primary Care Provider: [name], [name] Other Clinician: Referring Provider: Treating Provider/Extender: [name], [name] Weeks in Treatment: [id number] Verbal / Phone Orders: No Diagnosis Coding ICD-10 Coding Code Description L97.514 Non-pressure chronic ulcer of other part of right foot with necrosis of bone E10.621 Type 1 diabetes mellitus with foot ulcer L97.521 Non-pressure chronic ulcer of other part of left foot limited to breakdown of skin L97.529 Non-pressure chronic ulcer of other part of left foot with unspecified severity Follow-up Appointments ppointment in 2 weeks. - Dr. Dellia Nims Return A Other: - Begin taking Doxycycline, stop taking Keflex Bathing/ Shower/ Hygiene May shower and wash wound with soap and water. Edema Control - Lymphedema / SCD / Other Bilateral Lower Extremities Elevate legs to the level of the heart or above for 30 minutes daily and/or when sitting, a frequency of: - throughout the day Avoid standing for long periods of time. Moisturize legs daily. -  with dressing changes Off-Loading Open toe surgical shoe to: - to both feet Other: - minimal weight bearing right foot Wound Treatment Wound #50 - Foot Wound Laterality: Plantar, Left Cleanser: Wound Cleanser Every Other Day/30 Days Discharge Instructions: Cleanse the wound with wound cleanser or normal saline prior to applying a clean dressing using gauze sponges, not tissue or cotton balls. Prim Dressing: Hydrofera Blue Classic Foam, 2x2 in Every Other Day/30 Days ary Discharge Instructions: Moisten with saline prior to applying to wound bed Secondary Dressing: Woven Gauze Sponge, Non-Sterile 4x4 in Every Other Day/30 Days Discharge Instructions: Apply over primary dressing as directed. Secured With: Elastic Bandage 4 inch (ACE bandage) Every Other Day/30 Days Discharge Instructions: Secure with ACE bandage as directed. Secured With: [employer], 4.5x3.1 (in/yd) Every Other Day/30 Days Discharge Instructions: Secure with Kerlix as directed. Secured With: Paper Tape, 2x10 (in/yd) Every Other Day/30 Days Discharge Instructions: Secure dressing with tape as directed. Wound #51 - T Second oe Wound Laterality: Left Cleanser: Soap and Water Every Other Day/15 Days Discharge Instructions: May shower and wash wound with dial antibacterial soap and water prior to dressing change. Prim Dressing: Promogran Prisma Matrix, 4.34 (sq in) (silver collagen) Every Other Day/15 Days ary Discharge Instructions: Moisten collagen with saline or hydrogel Secondary Dressing: Woven Gauze Sponges 2x2 in Every Other Day/15 Days Discharge Instructions: Apply over primary dressing as directed. Secured With: Child psychotherapist, Sterile 2x75 (in/in) Every Other Day/15 Days Discharge Instructions: Secure with stretch gauze as directed. Secured With: Paper Tape, 1x10 (in/yd) Every Other Day/15 Days Discharge Instructions: Secure dressing with tape as directed. Wound #52 - Foot Wound Laterality: Right Cleanser: Soap and Water Every Other Day/15 Days Discharge Instructions: May shower and wash wound with dial antibacterial soap and water prior to dressing change. Prim Dressing: Promogran Prisma Matrix, 4.34 (sq in) (silver collagen) Every Other Day/15 Days ary Discharge Instructions: Moisten collagen with saline or hydrogel Secondary Dressing: Woven Gauze Sponges 2x2 in Every Other Day/15 Days Discharge Instructions: Apply over primary dressing as directed. Secured With: Elastic Bandage 4 inch (ACE bandage) Every Other Day/15 Days Discharge Instructions: Secure with ACE bandage as directed. Secured With: Child psychotherapist, Sterile 2x75 (in/in) Every Other Day/15 Days Discharge Instructions: Secure with stretch gauze as directed. Secured With: Paper Tape, 1x10 (in/yd) Every Other Day/15 Days Discharge Instructions: Secure  dressing with tape as directed. Patient Medications llergies: Cleocin, lisinopril A Notifications Medication Indication Start End wound infection 09/02/2020 doxycycline monohydrate DOSE oral 100 mg capsule - 1 capsule oral bid for 10 days Electronic Signature(s) Signed: 09/02/2020 3:49:16 PM By: Linton Ham MD Previous Signature: 09/02/2020 2:28:39 PM Version By: Lorrin Jackson Entered By: Linton Ham on 09/02/2020 15:49:14 -------------------------------------------------------------------------------- Problem List Details Patient Name: Date of Service: [name] 09/02/2020 1:30 PM Medical Record Number: [id number] Patient Account Number: [id number] Date of Birth/Sex: Treating RN: 05-Nov-1970 (50 y.o. [name] Primary Care Provider: [name], [name] Other Clinician: Referring Provider: Treating Provider/Extender: [name], [name] Weeks in Treatment: 104 Active Problems ICD-10 Encounter Code Description Active Date MDM Diagnosis L97.514 Non-pressure chronic ulcer of other part of right foot with necrosis of bone 09/03/2018 No Yes E10.621 Type 1 diabetes mellitus with foot ulcer 09/24/2018 No Yes L97.521 Non-pressure chronic ulcer of other part of left foot limited to breakdown of 06/24/2020 No Yes skin L97.529 Non-pressure chronic ulcer of other part of left foot with unspecified severity 07/29/2020 No Yes  Inactive Problems ICD-10 Code Description Active Date Inactive Date M86.671 Other chronic osteomyelitis, right ankle and foot 09/03/2018 09/03/2018 L97.411 Non-pressure chronic ulcer of right heel and midfoot limited to breakdown of skin 09/17/2019 09/17/2019 L97.521 Non-pressure chronic ulcer of other part of left foot limited to breakdown of skin 04/20/2019 04/20/2019 L97.812 Non-pressure chronic ulcer of other part of right lower leg with fat layer exposed 02/24/2019 02/24/2019 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 09/02/2020 1:30 PM Medical Record Number: [id number] Patient Account Number: [id number] Date of Birth/Sex: Treating RN: 03-Feb-1971 (50 y.o. [name] Primary Care Provider: [name], [name] Other Clinician: Referring Provider: Treating Provider/Extender: [name], [name] Weeks in Treatment: 104 Subjective History of Present Illness (HPI) When2 year old diabetic who is known to have type 1 diabetes which is poorly controlled last hemoglobin A1c was 11%. She comes in with a ulcerated area on the left lateral foot which has been there for over 6 months. Was recently she has been treated by [name] of podiatry who saw her last on 05/28/2016. Review of his notes revealed that the patient had incision and drainage with placement of antibiotic beads to the left foot on 04/11/2016 for possible osteomyelitis of the cuboid bone. Over the last year she's had a history of amputation of the left fifth toe and a femoropopliteal popliteal bypass graft somewhere in April 2017. 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 His note [name] mentions that the patient has been referred to me for further wound care and possibly great candidate for hyperbaric oxygen therapy due to recurrent osteomyelitis. However we do not have any x-rays of biopsy reports confirming this. He has been on several antibiotics including Bactrim and most recently is on doxycycline for an MRSA. I  understand, the patient was not a candidate for IV antibiotics as she has had previous PICC lines which resulted in blood clots in both arms. There was a x-ray report dated 04/04/2016 on [name] notes which showed evidence of fifth ray resection left foot with osteolytic changes noted to the fourth metatarsal and cuboid bone on the left. 06/13/2016 -- had a left foot x-ray which showed no acute fracture or dislocation and no definite radiographic evidence of osteomyelitis. Advanced osteopenia was seen. 06/20/2016 -- she has noticed a new wound on the right plantar foot in the region where she had a callus before. 06/27/16- the patient did have her x-ray of the right foot which showed no findings to suggest osteomyelitis. She saw her endocrinologist, [name], yesterday. Her A1c in January was 11. He also indicates mismanagement and noncompliance regarding her diabetes. She is currently on Bactrim for a lip infection. She is complaining of nausea, vomiting and diarrhea. She is unable to articulate the exact orders or dosing of the Bactrim; it is unclear when she will complete this. 07/04/2016 -- results from [facility] of ABIs with ankle waveforms were noted from 02/14/2016. The examination done on 06/27/2015 showed noncompressible ABIs with the right being 1.45 and the left being 1.33. The present examination showed a right ABI of 1.19 on the left of 1.33.  The conclusion was that right normal ABI in the lower extremity at rest however compared to previous study which was noncompressible ABI may be falsely elevated side suggesting medial calcification. The left ABI suggested medial calcification. 08/01/2016 -- the patient had more redness and pain on her right foot and did not get to come to see as noted she see her PCP or go to the ER and decided to take some leftover metronidazole which she had at home. As usual, the patient does report she feels and is rather noncompliant. 08/08/2016 -- --  x-ray of the right foot -- FINDINGS:Transmetatarsal amputation is noted. No bony destruction is noted to suggest osteomyelitis. IMPRESSION: No evidence of osteomyelitis. Postsurgical changes are seen. MRI would be more sensitive for possible bony changes. Culture has grown Serratia Marcescens -- sensitive to Bactrim, ciprofloxacin, ceftazidime she was seen by [name] on 08/06/2016. He did not find any exposed bone, muscle, tendon, ligament or joint. There was no malodor and he did a excisional debridement in the office. ============ Old notes: 50 year old patient who is known to the wound clinic for a while had been away from the wound clinic since 09/01/2014. Over the last several months she has been admitted to various hospitals including [facility] [place]. She was treated for a right metatarsal osteomyelitis with a transmetatarsal amputation and this was done about 2 months ago. He has a small ulcerated area on the right heel and she continues to have an ulcerated area on the left plantar aspect of the foot. The patient was recently admitted to the [facility] hospital group between 7/12 and 10/18/2014. she was given 3 weeks of IV vancomycin and was to follow-up with her surgeons at [facility] and also took oral vancomycin for C. difficile colitis. 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 MRI of the left foot done on 10/14/2014 showed no abscess or osteomyelitis. 04/27/15; this is a patient we know from previous stays in the wound care center. She is a type I diabetic I am not sure of her control currently. Since the last time I saw her she is had a right transmetatarsal amputation and has no wounds  on her right foot and has no open wounds. She is been followed at the wound care center at [facility] in [place]. She comes today with the desire to undergo hyperbaric treatment locally. Apparently one of her wound care providers in [place] has suggested hyperbarics. This is in response to an MRI from 04/18/15 that showed increased marrow signal and loss of the proximal fifth metatarsal cortex evidence of osteomyelitis with likely early osteomyelitis in the cuboid bone as well. She has a large wound over the base of the fifth metatarsal. She also has a eschar over her the tips of her toes on 1,3 and 5. She does not have peripheral pulses and apparently is going for an angiogram tomorrow which seems reasonable. After this she is going to infectious disease at [facility]. They have been using Medihoney to the large wound on the lateral aspect of the left foot to. The patient has known Charcot deformity from diabetic neuropathy. She also has known diabetic PAD. Surprisingly I can't see that she has had any recent antibiotics, the patient states the last antibiotic she had was at  the end of November for 10 days. I think this was in response to culture that showed group G strep although I'm not exactly sure where the culture was from. She is also had arterial studies on 03/29/15. This showed a right ABI of 1.4 that was noncompressible. Her left ABI was 0.73. 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 It was not felt that arterial inflow was adequate for healing of a foot ulcer. Her Doppler waveforms looked monophasic ===== READMISSION 02/28/17; this is in an now 50 year old woman we've had at several different occasions in this clinic. 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 She has a remote ex-smoker. She was last seen in this clinic by Dr. Con Memos I think in May. More recently she is been followed by her podiatrist [name] an  infectious disease Dr. Megan Salon. She has 2 open wounds the major one is over the right first metatarsal head she also has a wound on the left plantar foot. an MRI of the right foot on 01/01/17 showed a soft tissue ulcer along the plantar aspect of the first metatarsal base consistent with osteomyelitis of the first metatarsal stump. Dr. 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 According to the patient this is been open for slightly over a month. She has been on a combination of Cipro 500 twice a day, Zyvox 600 twice a day and Flagyl 500 3 times a day for over a month now as directed by Dr. Megan Salon. cultures of the right foot earlier this year showed MRSA in January and Serratia in May. January also had a few viridans strep. Recent x-rays of both feet were done and [name] office and I don't have these reports. The patient has known PAD and has a history of aleft femoropopliteal bypass in April 2017. 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 Dwyane Dee. In spite of this the last hemoglobin A1c I can see is 10.1 on 01/01/2017. She is being referred by 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 She is been using Medihoney to this area and also an area on the left midfoot. She is using healing sandals bilaterally. ABIs in this clinic at the left posterior tibial was 1.1 noncompressible on the right READMISSION Non invasive vascular NOVANT 5/18 Aftercare following surgery of the circulatory system Procedure Note - Interface, External Ris In - 08/13/2016 11:05 AM EDT Procedure: Examination consists of physiologic resting arterial pressures of the brachial and ankle arteries bilaterally with  continuous wave Doppler waveform analysis. Previous: Previous exam performed on 02/14/16 demonstrated ABIs of Rt = 1.19 and Lt = 1.33. Right: ABI = non-compressible PT 1.47 DP. S/P transmet amputation. , Left: ABI = 1.52, 2nd digit pressure = 87 mmHg Conclusions: Right: ABI (>1.3) may be falsely elevated, suggesting medial calcification. Left: ABI (>1.3) may be falsely elevated, suggesting medial calcification The patient is a now 50 year old type I diabetic is had multiple issues her graded to chronic diabetic foot ulcers. 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 We had her in the clinic lastin November. At that point she had wounds on her bilateral feet.she  had wanted to try hyperbarics however the healogics review process denied her because she hadn't followed up with her vascular surgeon for her left femoropopliteal bypass. The bypass was done by Dr. Raul Del at [facility]. We made her a follow-up with Dr. Raul Del however she did not keep the appointment and therefore she was not approved The patient shows me a small wound on her left fourth metatarsal head on her phone. She developed rapid discoloration in the plantar aspect of the left foot and she was admitted to hospital from 2/2 through 05/10/17 with wet gangrene of the left foot osteomyelitis of the fourth metatarsal heads. She was admitted acutely ill with a temperature of 103. She was started on broad-spectrum vancomycin and cefepime. On 05/06/17 she was taken to the OR by [name] her podiatric surgeon for an incision and drainage irrigation of the left foot wound. Cultures from this surgery revealed group be strep and anaerobes. she was seen by Dr.Xu of orthopedic surgery and scheduled for a below-knee amputation which she u refused. Ultimately she was discharged on Levaquin and Flagyl for one month. MRI 05/05/17 done while she was in the hospital showed  abscess adjacent to the fourth metatarsal head and neck small abscess around the fourth flexor tendon. Inflammatory phlegmon and gas in the soft tissues along the lateral aspect of the fourth phalanx. Findings worrisome for osteomyelitis involving the fourth proximal and middle phalanx and also the third and fourth metatarsals. Finally the patient had actually shortly before this followed up with Dr. Raul Del at no time on 04/29/17. He felt that her left femoropopliteal bypass was patent he felt that her left-sided toe pressures more than adequate for healing a wound on the left foot. This was before her acute presentation. 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 05/28/17; she is started hyperbarics. The patient tells me that for some reason she was not actually on Levaquin but I think on ciprofloxacin. She was on Flagyl. She only started her Levaquin yesterday due to some difficulty with the pharmacy and perhaps her sister picking it up. She has an appointment with [name] tomorrow and with infectious disease early next week. She has no new complaints 06/06/17; the patient continues in hyperbarics. She saw [name] on 05/29/17 who is her podiatric surgeon. He is elected for a transmetatarsal amputation on 06/27/17. I'm not sure at what level he plans to do this amputation. The patient is unaware ooShe also saw Dr. Megan Salon of infectious disease who elected to continue her on current antibiotics I think this is ciprofloxacin and Flagyl. I'll need to clarify with her tomorrow if she actually has this. We're using silver alginate to the actual wound. Necrotic surface today with material under the flap of her foot. ooOriginal MRI showed abscesses as well as osteomyelitis of the proximal and middle fourth phalanx and the third and fourth metatarsal heads 06/11/17; patient continues in hyperbarics and continues on oral antibiotics. She is doing well. The wound looks better. The necrotic part of this under  the flap in her superior foot also looks better. she is been to see [name]. I haven't had a chance to look at his note. Apparently he has put the transmetatarsal amputation on hold her request it is still planning to take her to the OR for debridement and product application ACEL. I'll see if I can find his note. I'll therefore leave product ordering/requests to [name] for now. I was going to look at Dermagraft 06/18/17-she is here in  follow-up evaluation for bilateral foot wounds. She continues with hyperbaric therapy. She states she has been applying manuka honey to the right plantar foot and alternate manuka honey and silver alginate to the left foot, despite our orders. We will continue with same treatment plan and she will follo up next week. 06/25/17; I have reviewed [name] last note from 3/11. She has operative debridement in 2 days' time. By review his note apparently they're going to place there is skin over the majority of this wound which is a good choice. She has a small satellite area at the most proximal part of this wound on the left plantar foot. The area on the right plantar foot we've been using silver alginate and it is close to healing. 07/02/17; unfortunately the patient was not easily approved for [name] proposed surgery. I'm not completely certain what the issue is. She has been using silver alginate to the wound she has completed a first course of hyperbarics. She is still on Levaquin and Flagyl. I have really lost track of the time course here.I suspect she should have another week to 2 of antibiotics. I'll need to see if she is followed up with infectious disease Dr. Megan Salon 07/09/17; the patient is followed up with Dr. Megan Salon. 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 She continues on Levaquin and metronidazole continuing both of these for now I think she is been on fr about 6 weeks. She still has some drainage but no pain.  No fever. Her had been plans for her to go to the OR for operative debridement with her podiatrist [name], I am not exactly sure where that is. I'll probably slip a note to [name] today. I note that she follows with Dr. Dwyane Dee of endocrinology. We have her recertified for hyperbaric oxygen. I have not heard about Dermagraft however I'll see if [name] is planning a skin substitute as well 07/16/17; the patient tells me she is just about out of [place]. I'll need to check Dr. Hale Bogus last notes on this. She states she has plenty of Flagyl however. She comes in today complaining of pain in the right lateral foot which she said lasted for about a day. The wound on the right foot is actually much more medially. She also tells me that the [facility] cost a lot of pain in the left foot wound and she turned back to silver alginate. Finally Dermagraft has a $814 per application co-pay. She cannot afford this 07/23/17; patient arrives today with the wound not much smaller. There is not much new to add. She has not heard from [name] all try to put in a call to them today. She was asking about Dermagraft again and she has an over $481 per application co-pay she states that she would be willing to try to do a payment plan. I been tried to avoid this. We've been using silver alginate, I'll change to [facility] 07/30/17-She is here in follow-up evaluation for left foot ulcer. She continues hyperbaric medicine. The left foot ulcer is stable we will continue with same treatment plan 08/06/17; she is here for evaluation of her left foot ulcer. Currently being treated for hyperbarics or underlying osteomyelitis. She is completed antibiotics. The left foot ulcer is better smaller with healthier looking granulation. For various reasons I am not really clear on we never got her back to the OR with [name]. He did not  respond to my secure text message. Nevertheless I think that surgery on this point is  not necessary nor am I completely clear that a skin substitute is necessary The patient is complaining about pain on the outside of her right foot. 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 There is no erythema. She also states the foot is warm versus her other part of her upper leg and this is largely true. It is not totally clear to me what's causing this. She thinks it's different from her usual neuropathy pain 08/13/17; she arrives in clinic today with a small wound which is superficial on her right first metatarsal head. 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 She tells [place] she was up on her feet over the Mother's Day celebration. ooThe large wound is on the left foot. Continues with hyperbarics for underlying osteomyelitis. We're using Hydrofera Blue. She asked me today about where we were with Dermagraft. I had actually excluded this because of the co-pay however she wants to assume this therefore I'll recheck the co-pay an order for next week. 08/20/17; the patient agreed to accept the co-pay of the first Apligraf which we applied today. She is disappointed she is finishing hyperbarics will run this through the insurance on the extent of the foot infection and the extent of the wound that she had however she is already had 60 dive's. Dermagraft No. 1 08/27/17; Dermagraft No. 2. She is not eligible for any more hyperbaric treatments this month. She reports a fair amount of drainage and she actually changed to the external dressings without disturbing the direct contact layer 09/03/17; the patient arrived in clinic today with the wound superficially looking quite healthy. Nice vibrant red tissue with some advancing epithelialization although not as much adherence of the flap as I might like. However she noted on her own fourth toe some bogginess and she brought that to our attention. Indeed this was boggy feeling like a possibility of subcutaneous fluid. She stated  that this was similar to how an issue came up on the lateral foot that led to her fifth ray amputation. She is not been unwell. We've been using Dermagraft 09/10/17; the culture that I did not last week was MRSA. She saw Dr. Megan Salon this morning who is going to start her on vancomycin. I had sent him a secure a text message yesterday. I also spoke with her podiatric surgeon [name] about surgery on this foot the options for conserving a functional foot etc. Promised me he would see her and will make back consultation today. Paradoxically her actual wound on the plantar aspect of her left foot looks really quite good. I had given her 5 days worth of Baxdella to cover her for MRSA. Her MRI came back showing osteomyelitis within the third metatarsal shaft and head and base of the third and fourth proximal phalanx. She had extensive inflammatory changes throughout the soft tissue of the lateral forefoot. With an ill-defined fluid around the fourth metatarsal extending into the plantar and dorsal soft tissues 09/19/17; the patient is actually on oral Septra and Flagyl. She apparently refused IV vancomycin. She also saw [name] at my request who is planning her for a left BKA sometime in mid July. MRI showed osteomyelitis within the third metatarsal shaft and head and the basis of the third and fourth proximal phalanx. I believe there was felt to be possible septic arthritis involving the third MTP. 09/26/17; the patient went back to Dr. Megan Salon  at my suggestion and is now receiving IV daptomycin. Her wound continues to look quite good making the decision to proceed with a transmetatarsal amputation although more difficult for the patient. I believe in my extensive discussions with her she has a good sense of the pros and cons of this. I don't NV the tuft decision she has to make. She has an appointment with [name] I believe in mid July and I previously spoken to him about this issue Has we had used  3 previous Dermagraft. Given the condition of the wound surface I went ahead and added the fourth one today area and I did this not fully realizing that she'll be traveling to [place] next week. I'm hopeful she can come back in 2 weeks 10/21/17; Her same Dermagraft on for about 3-1/2 weeks. In spite of this the wound arrives looking quite healthy. There is been a lot of healing dimensions are smaller. Looking at the square shaped wound she has now there is some undermining and some depth medially under the undermining although I cannot palpate any bone. No surrounding infection is obvious. She has difficult questions about how to look at this going forward vis--vis amputations versus continued medical therapy. T be truthful the wound is looks so o healthy and it is continued to contract. Hard to justify foot surgery at this point although I still told her that I think it might come to that if we are not able to eradicate the underlying MRSA. She is still highly at risk and she understands this 11/06/17 on evaluation today patient appears to be doing better in regard to her foot ulcer. She's been tolerating the dressing changes without complication. Currently she is here for her Dermagraft #6. Her wound continues to make excellent progress at this point. She does not appear to have any evidence of infection which is good news. 11/13/17 on evaluation today patient appears to be doing excellent at this time. She is here for repeat Dermagraft application. This is #7. Overall her wound seems to be making great progress. 12/05/17; the patient arrives with the wound in much better condition than when I last saw this almost 6 weeks ago. She still has a small probing area in the left metatarsal head region on the lateral aspect of her foot. We applied her last Dermagraft today. ooSince the last time she is here she has what appears to a been a blood blister on the plantar aspect of left foot although I don't see  this is threatening. There is also a thick raised tissue on the right mid metatarsal head region. This was not there I don't think the last time she was here 3 weeks ago. 12/12/17; the patient continues to have a small programming area in the left metatarsal head region on the lateral aspect of her foot which was the initial large surgical wound. I applied her last Apligraf last week. I'm going to use Endoform starting today ooUnfortunately she has an excoriated area in the left mid foot and the right mid foot. The left midfoot looks like a blistered area this was not opened last week it certainly is open today. Using silver alginate on these areas. She promises me she is offloading this. 12/19/17; the small probing area in the left metatarsal head eyes think is shallower. In general her original wound looks better. We've been using Endoform. The area inferiorly that I think was trauma last week still requires debridement a lot of nonviable surface which I  removed. She still has an open open area distally in her foot ooSimilarly on the right foot there is tightly adherent surface debris which I removed. Still areas that don't look completely epithelialized. This is a small open area. We used silver alginate on these areas 12/26/2017; the patient did not have the supplies we ordered from last week including the Endoform. The original large wound on the left lateral foot looks healthy. She still has the undermining area that is largely unchanged from last week. She has the same heavily callused raised edged wounds on the right mid and left midfoot. Both of these requiring debridement. We have been using silver alginate on these areas 01/02/2018; there is still supply issues. We are going to try to use Prisma but I am not sure she actually got it from what she is saying. She has a new open area on the lateral aspect of the left fourth toe [previous fifth ray amputation]. Still the one tunneling area over  the fourth metatarsal head. The area is in the midfoot bilaterally still have thick callus around them. She is concerned about a raised swelling on the lateral aspect of the foot. However she is completely insensate 01/10/2018; we are using Prisma to the wounds on her bilateral feet. Surprisingly the tunneling area over the left fourth metatarsal head that was part of her original surgery has closed down. She has a small open area remaining on the incision line. 2 open areas in the midfoot. 02/10/2018; the patient arrives back in clinic after a month hiatus. She was traveling to visit family in [place]. Is fairly clear she was not offloading the areas on her feet. The original wound over the left lateral foot at the level of metatarsal heads is reopened and probes medially by about a centimeter or 2. She notes that a week ago she had purulent drainage come out of an area on the left midfoot. Paradoxically the worst area is actually on the right foot is extensive with purulent drainage. We will use silver alginate today 02/17/2018; the patient has 3 wounds one over the left lateral foot. She still has a small area over the metatarsal heads which is the remnant of her original surgical wound. This has medial probing depth of roughly 1.4 cm somewhat better than last week. The area on the right foot is larger. We have been using silver alginate to all areas. The area on the right foot and left foot that we cultured last week showed both Klebsiella and Proteus. Both of these are quinolone sensitive. The patient put her's self on Bactrim and Flagyl that she had left hanging around from prior antibiotic usages. She was apparently on this last week when she arrived. I did not realize this. Unfortunately the Bactrim will not cover either 1 of these organisms. We will send in Cipro 500 twice daily for a week 03/04/2018; the patient has 2 wounds on the left foot one is the original wound which was a surgical  wound for a deep DFU. At one point this had exposed bone. She still has an area over the fourth metatarsal head that probes about 1.4 cm although I think this is better than last week. I been using silver nitrate to try and promote tissue adherence and been using silver alginate here. ooShe also has an area in the left midfoot. This has some depth but a small linear wound. Still requiring debridement. ooOn the right midfoot is a circular wound. A lot of thick  callus around this area. ooWe have been using silver alginate to all wound areas ooShe is completed the ciprofloxacin I gave her 2 weeks ago. 03/11/2018; the patient continues to have 2 open areas on the left foot 1 of which was the original surgical wound for a deep DFU. Only a small probing area remains although this is not much different from last week we have been using silver alginate. The other area is on the midfoot this is smaller linear but still with some depth. We have been using silver alginate here as well ooOn the right foot she has a small circular wound in the mid aspect. This is not much smaller than last time. We have been using silver alginate here as well 03/18/2018; she has 3 wounds on the left foot the original surgical wound, a very superficial wound in the mid aspect and then finally the area in the mid plantar foot. She arrives in today with a very concerning area in the wound in the mid plantar foot which is her most proximal wound. There is undermining here of roughly 1-1/2 cm superiorly. Serosanguineous drainage. She tells me she had some pain on for over the weekend that shot up her foot into her thigh and she tells me that she had a nodule in the groin area. ooShe has the single wound in the right foot. ooWe are using endoform to both wound areas 03/24/2018; the patient arrives with the original surgical wound in the area on the left midfoot about the same as last week. There is a collection of fluid  under the surface of the skin extending from the surgical wound towards the midfoot although it does not reach the midfoot wound. The area on the right foot is about the same. Cultures from last week of the left midfoot wound showed abundant Klebsiella abundant Enterococcus faecalis and moderate methicillin resistant staph I gave her Levaquin but this would have only covered the Klebsiella. She will need linezolid 04/01/2018; she is taking linezolid but for the first few days only took 1 a day. I have advised her to finish this at twice daily dosing. In any case all of her wounds are a lot better especially on the left foot. The original surgical wound is closed. The area on the left midfoot considerably smaller. The area on the right foot also smaller. 04/08/2018; her original surgical wound/osteomyelitis on the left foot remains closed. She has area on the left foot that is in the midfoot area but she had some streaking towards this. This is not connected with her original wound at least not visually. ooSmall wound on the right midfoot appears somewhat smaller. 04/15/18; both wounds looks better. Original wound is better left midfoot. Using silver alginate 1/21; patient states she uses saltwater soak in, stones or remove callus from around her wounds. She is also concerned about a blood blister she had on the left foot but it simply resolved on its own. We've been using silver alginate 1/28; the patient arrives today with the same streaking area from her metatarsals laterally [the site of her original surgical wound] down to the middle of her foot. There is some drainage in the subcutaneous area here. This concerns me that there is actually continued ongoing infection in the metatarsals probably the fourth and third. This fixates an MRI of the foot without contrast [chronic renal failure] ooThe wound in the mid part of the foot is small but I wonder whether this area actually connects with the  more  distal foot. ooThe area on the right midfoot is probably about the same. Callus thick skin around the small wound which I removed with a curette we have been using silver alginate on both wound areas 2/4; culture I did of the draining site on the left foot last time grew methicillin sensitive staph aureus. MRI of the left foot showed interval resolution of the findings surrounding the third metatarsal joint on the prior study consistent with treated osteomyelitis. Chronic soft tissue ulceration in the plantar and lateral aspect of the forefoot without residual focal fluid collection. No evidence of recurrent osteomyelitis. 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 She tells me she had nausea and vomiting over the weekend and missed 2 days. In general her foot looks much the same. She has a small open area just below the left fourth metatarsal head. A linear area in the left midfoot. Some discoloration extending from the inferior part of this into the left lateral foot although this appears to be superficial. She has a small area on the right midfoot which generally looks smaller after debridement 2/18; the patient is completing his cephalexin and has another 2 days. She continues to have open areas on the left and right foot. 2/25; she is now off antibiotics. The area on the left foot at the site of her original surgical wound has closed yet again. She still has open areas in the mid part of her foot however these appear smaller. The area on the right mid foot looks about the same. We have been using silver alginate She tells me she had a serious hypoglycemic spell at home. She had to have EMS called and get IV dextrose 3/3; disappointing on the left lateral  foot large area of necrotic tissue surrounding the linear area. This appears to track up towards the same original surgical wound. Required extensive debridement. The area on the right plantar foot is not a lot better also using silver 3/12; the culture I did last time showed abundant enterococcus. I have prescribed Augmentin, should cover any unrecognized anaerobes as well. In addition there were a few MRSA and Serratia that would not be well covered although I did not want to give her multiple antibiotics. She comes in today with a new wound in the right midfoot this is not connected with the original wound over her MTP a lot of thick callus tissue around both wounds but once again she said she is not walking on these areas 3/17-Patient comes in for follow-up on the bilateral plantar wounds, the right midfoot and the left plantar wound. Both these are heavily callused surrounding the wounds. We are continuing to use silver alginate, she is compliant with offloading and states she uses a wheelchair fairly often at home 3/24; both wound areas have thick callus. However things actually look quite a bit better here for the majority of her left foot and the right foot. 3/31; patient continues to have thick callused somewhat irritated looking tissue around the wounds which individually are fairly superficial. There is no evidence of surrounding infection. We have been using silver alginate however I change that to [facility] today 4/17; patient returns to clinic after having a scare with Covid she tested negative in her  primary doctor's office. She has been using Hydrofera Blue. She does not have an open area on the right foot. On the left foot she has a small open area with the mid area not completely viable. She showed me pictures of what looks like a hemorrhagic blister from several days ago but that seems to have healed over this was on the lateral left foot 4/21; patient comes in to clinic with  both her wounds on her feet closed. However over the weekend she started having pain in her right foot and leg up into the thigh. She felt as though she was running a low-grade fever but did not take her temperature. She took a doxycycline that she had leftover and yesterday a single Septra and metronidazole. She thinks things feel somewhat better. 4/28; duplex ultrasound I ordered last week was negative for DVT or superficial thrombophlebitis. She is completed the doxycycline I gave her. States she is still having a lot of pain in the right calf and right ankle which is no better than last week. She cannot sleep. She also states she has a temperature of up to 101, coughing and complaining of visual loss in her bilateral eyes. Apparently she was tested for Covid 2 weeks ago at [facility] and that was negative. Readmission: 09/03/18 patient presents back for reevaluation after having been evaluated at the end of April regarding erythema and swelling of her right lower extremity. Subsequently she ended up going to the hospital on 07/29/18 and was admitted not to be discharged until 08/08/18. Unfortunately it was noted during the time that she was in the hospital that she did have methicillin-resistant Staphylococcus aureus as the infection noted at the site. It was also determined that she did have osteomyelitis which appears to be fairly significant. She was treated with vancomycin and in fact is still on IV vancomycin at dialysis currently. This is actually slated to continue until 09/12/18 at least which will be the completion of the six weeks of therapy. Nonetheless based on what I'm seeing at this point I'm not sure she will be anywhere near ready to discontinue antibiotics at that time. Since she was released from the hospital she was seen by [name] who is her podiatrist on 08/27/18. His note specifically states that he is recommended that the patient needs of one knee amputation on the right as she has a  life- threatening situation that can lead quickly to sepsis. The patient advised she would like to try to save her leg to which [name] apparently told her that this was against all medical advice. She also want to discontinue the Wound VAC which had been initiated due to the fact that she wasn't pleased with how the wound was looking and subsequently she wanted to pursue applying Medihoney at that time. He stated that he did not believe that the right lower extremity was salvageable and that the patient understood but would still like to attempt hyperbaric option therapy if it could be of any benefit. She was therefore referred back to [place] for further evaluation. He plans to see her back next week. Upon inspection today patient has a significant amount purulent drainage noted from the wound at this point. The bone in the distal portion of her foot also appears to be extremely necrotic and spongy. When I push down on the bone it bubbles and seeps purulent drainage from deeper in the end of the foot. I do not think that this is likely going to  heal very well at all and less aggressive surgical debridement were undertaken more than what I believe we can likely do here in our office. 09/12/2018; I have not seen this patient since the most recent hospitalization although she was in our clinic last week. I have reviewed some of her records from a complex hospitalization. 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 There is situation was complicated by MRSA bacteremia and acute on chronic renal failure now on dialysis. She is receiving vancomycin at dialysis. We started her on Dakin's wet-to-dry last week she is changing this daily. There is still purulent drainage coming out of her foot. Although she is apparently "agreeable" to a below-knee amputation which is been suggested by multiple clinicians she wants this to be done in [place]. She apparently has a  telehealth visit with that provider sometime in late [place] 6/24. I have told her I think this is probably too long. Nevertheless I could not convince her to allow a local doctor to perform BKA. 09/19/2018; the patient has a large necrotic area on the right anterior foot. She has had previous transmetatarsal amputations. Culture I did last week showed MRSA nothing else she is on vancomycin at dialysis. She has continued leaking purulent drainage out of the distal part of the large circular wound on the right anterior foot. She apparently went to see Dr. 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 Somehow that translated into her being referred to plastic surgery for debridement of the area. I gather she basically refused amputation although I do not have a copy of Dr. Berenice Primas notes. The patient really wants to have a trial of hyperbaric oxygen. I agreed with initial assessment in this clinic that this was probably too far along to benefit however if she is going to have plastic surgery I think she would benefit from ancillary hyperbaric oxygen. The issue here is that the patient has benefited as maximally as any patient I have ever seen from hyperbaric oxygen therapy. Most recently she had exposed bone on the lateral part of her left foot after a surgical procedure and that actually has closed. She has eschared areas in both heels but no open area. She is remained systemically well. I am not optimistic that anything can be done about this but the patient is very clear that she wants an attempt. The attempt would include a wound VAC further debridements and hyperbaric oxygen along with IV antibiotics. 6/26; I put her in for a trial of hyperbaric oxygen only because of the dramatic response she has had with wounds on her left midfoot earlier this year which was a surgical wound that went straight to her bone over the metatarsal heads and also remotely the left third toe. We will  see if we can get this through our review process and insurance. She arrives in clinic with again purulent material pouring out of necrotic bone on the top of the foot distally. There is also some concerning erythema on the front of the leg that we marked. It is bit difficult to tell how tender this is because of neuropathy. I note from infectious disease that she had her vancomycin extended. 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 She had the wound VAC on for part of the week. The rest of the time she is putting various things on this including Medihoney, "ionized water" silver sorb gel etc. 7/7; follow-up along  with HBO. She is still on vancomycin at dialysis. She has a large open area on the dorsal right foot and a small dark eschar area on her heel. There is a lot less erythema in the area and a lot less tenderness. From an infection point of view I think this is better. She still has a lot of necrosis in the remaining right forefoot [previous TMA] we are still using the wound VAC in this area 7/16; follow-up along with HBO. I put her on linezolid after she finished her vancomycin. We started this last Friday I gave her 2 weeks worth. I had the expectation that she would be operatively debrided by Dr. Marla Roe but that still has not happened yet. Patient phoned the office this week. She arrives for review today after HBO. The distal part of this wound is completely necrotic. Nonviable pieces of tendon bone was still purulent drainage. Also concerning that she has black eschar over the heel that is expanding. I think this may be indicative of infection in this area as well. She has less erythema and warmth in the ankle and calf but still an abnormal exam 7/21 follow-up along with HBO. I will renew her linezolid after checking a CBC with differential monitoring her blood counts especially her platelets. She was supposed to have surgery yesterday but if I am reading  things correctly this was canceled after her blood sugar was found to be over 500. I thought Dr. Marla Roe who called me said that they were sending her to the ER but the patient states that was not the case. 7/28. Follow-up along with HBO. She is on linezolid I still do not have any lab work from dialysis even though I called last week. The patient is concerned about an area on her left lateral foot about the level of the base of her fifth metatarsal. I did not really see anything that ominous here however this patient is in [place] ability to point out problems that she is sensing and she has been accurate in the past Finally she received a call from Dr. Marla Roe who is referring her to another orthopedic surgeon stating that she is too booked up to take her to the operating room now. Was still using a wound VAC on the foot 8/3 -Follow-up after HBO, she is got another week of linezolid, she is to call ID for an appointment, x-rays of both feet were reviewed, the left foot x-ray with third MTP joint osteo- Right foot x-ray widespread osteo-in the right midfoot Right ankle x-ray does not show any active evidence of infection 8/11-Patient is seen after HBO, the wounds on the right foot appear to be about the same, the heel wound had some necrotic base over tendon that was debrided with a curette 8/21; patient is seen after HBO. The patient's wound on her dorsal foot actually looks reasonably good and there is substantial amount of epithelialization however the open area distally still has a lot of necrotic debris partially bone. I cannot really get a good sense of just how deep this probes under the foot. She has been pressuring me this week to order medical maggots through a company in [place] for her. The problem I have is there is not a defined wound area here. On the positive side there is no purulence. She has been to see infectious disease she is still on Septra DS although I have not had a  chance to review their notes 8/28; patient is seen in  conjunction with HBO. The wounds on her foot continued to improve including the right dorsal foot substantially the, the distal part of this wound and the area on the right heel. We have been using a wound VAC over this chronically. She is still on trimethoprim as directed by infectious disease 9/4; patient is seen in conjunction with HBO. Right dorsal foot wound substantially anteriorly is better however she continues to have a deep wound in the distal part of this that is not responding. We have been using silver collagen under border foam ooArea on the right plantar medial heel seems better. We have been using Hydrofera Blue 12/12/18 on evaluation today patient appears to be doing about the same with regard to her wound based on prior measurements. She does have some necrotic tissue noted on the lateral aspect of the wound that is going require a little bit of sharp debridement today. This includes what appears to be potentially either severely necrotic bone or tendon. Nonetheless other than that she does not appear to have any severe infection which is good news 9/18; it is been 2 weeks since I saw this wound. She is tolerating HBO well. Continued dramatic improvement in the area on the right dorsal foot. She still has a small wound on the heel that we have been using Hydrofera Blue. She continues with a wound VAC 9/24; patient has to be seen emergently today with a swelling on her right lateral lower leg. She says that she told Dr. Evette Doffing about this and also myself on a couple of occasions but I really have no recollection of this. She is not systemically unwell and her wound really looked good the last time I saw this. She showed this to providers at dialysis and she was able to verify that she was started on cephalexin today for 5 doses at dialysis. She dialyzes on Tuesday Thursday and Saturday. 10/2; patient is seen in conjunction with  HBO. The area that is draining on the right anterior medial tibia is more extensive. Copious amounts of serosanguineous drainage with some purulence. We are still using the wound VAC on the original wound then it is stable. Culture I did of the original IandD showed MRSA I contacted dialysis she is now on vancomycin with dialysis treatments. I asked them to run a month 10/9; patient seen in conjunction with HBO. She had a new spontaneous open area just above the wound on the right medial tibia ankle. More swelling on the right medial tibia. Her wound on the foot looks about the same perhaps slightly better. There is no warmth spreading up her leg but no obvious erythema. her MRI of the foot and ankle and distal tib-fib is not booked for next Friday I discussed this with her in great detail over multiple days. it is likely she has spreading infection upper leg at least involving the distal 25% above the ankle. She knows that if I refer her to orthopedics for infectious disease they are going to recommend amputation and indeed I am not against this myself. We had a good trial at trying to heal the foot which is what she wanted along with antibiotics debridement and HBO however she clearly has spreading infection [probably staph aureus/MRSA]. Nevertheless she once again tells me she wants to wait the left of the MRI. She still makes comments about having her amputation done in [place]. 10/19; arrives today with significant swelling on the lateral right leg. Last culture I did showed Klebsiella. Multidrug-resistant. Cipro  was intermediate sensitivity and that is what I have her on pending her MRI which apparently is going to be done on Thursday this week although this seems to be moving back and forth. She is not systemically unwell. We are using silver alginate on her major wound area on the right medial foot and the draining areas on the right lateral lower leg 10/26; MRI showed extensive abscess  in the anterior compartment of the right leg also widespread osteomyelitis involving osseous structures of the midfoot and portions of the hindfoot. Also suspicion for osteomyelitis anterior aspect of the distal medial malleolus. Culture I did of the purulence once again showed a multidrug-resistant Klebsiella. I have been in contact with nephrology late last week and she has been started on cefepime at dialysis to replace the vancomycin We sent a copy of her MRI report to Dr. Geroge Baseman in [place] who is an orthopedic surgeon. The patient takes great stock in his opinion on this. She says she will go to [place] to have her leg amputated if Dr. Geroge Baseman does not feel there is any salvage options. 11/2; she still is not talk to her orthopedic surgeon in [place]. Apparently he will call her at [phone] this afternoon. The quality of this is she has not allowed me to refer her anywhere. She has been told over and over that she needs this amputated but has not agreed to be referred. She tells me her blood sugar was 600 last night but she has not been febrile. 11/9; she never did got a call from the orthopedic surgeon in [place] therefore that is off the radar. We have arranged to get her see orthopedic surgery at [facility]. She still has a lot of draining purulence coming out of the new abscess in her right leg although that probably came from the osteomyelitis in her right foot and heel. Meanwhile the original wound on the right foot looks very healthy. Continued improvement. The issue is that the last MRI showed osteomyelitis in her right foot extensively she now has an abscess in the right anterior lower leg. There is nobody in [place] who will offer this woman anything but an amputation and to be honest that is probably what she needs. I think she still wants to talk about limb salvage although at this point I just do not see that. She has completed her vancomycin at dialysis which was for the  original staph aureus she is still on cefepime for the more recent Klebsiella. She has had a long course of both of these antibiotics which should have benefited the osteomyelitis on the right foot as well as the abscess. 11/16; apparently [place] elective surgery is shut down because of COVID-19 pandemic. I have reached out to some contacts at [facility] to see if we can get her an orthopedic appointment there. I am concerned about continually leaving this but for the moment everything is static. In fact her original large wound on this foot is closing down. It is the abscess on the right anterior leg that continues to drain purulent serosanguineous material. She is not currently on any antibiotics however she had a prolonged course of vancomycin [1 month] as well as cefepime for a month 02/24/2019 on evaluation today patient appears to be doing better than the last time I saw her. This is not a patient that I typically see. With that being said I am covering for Dr. Dellia Nims this week and again compared to when I last saw her overall the  wounds in particular seem to be doing significantly better which is good news. With that being said the patient tells me several disconcerting things. She has not been able to get in to see anyone for potential debridement in regard to her leg wounds although she tells me that she does not think it is necessary any longer because she is taking care of that herself. She noticed a string coming out of the lower wound on her leg over the last week. The patient states that she subsequently decided that we must of pack something in there and started pulling the string out and as it kept coming and coming she realized this was likely her tendon. With that being said she continued to remove as much of this as she could. She then I subsequently proceeded to using tubes of antibiotic ointment which she will stick down into the wound and then scored as much as she can until  she sees it coming out of the other wound opening. She states that in doing this she is actually made things better and there is less redness and irritation. With regard to her foot wound she does have some necrotic tendon and tissue noted in one small corner but again the actual wound itself seems to be doing better with good granulation in general compared to my last evaluation. 12/7; continued improvement in the wound on the substantial part of the right medial foot. Still a necrotic area inferiorly that required debridement but the rest of this looks very healthy and is contracting. She has 2 wounds on the right lateral leg which were her original drainage sites from her abscess but all of this looks a lot better as well. She has been using silver alginate after putting antibiotic biotic ointment in one wound and watching it come out the other. I have talked to her in some detail today. I had given her names of orthopedic surgeons at [facility] for second opinion on what to do about the right leg. I do not think the patient never called them. She has not been able to get a hold of the orthopedic surgeon in [place] that she had put a lot of faith in as being somebody would give her an opinion that she would trust. I talked to her today and said even if I could get her in to another orthopedic surgeon about the leg which she accept an amputation and she said she would not therefore I am not going to press this issue for the moment 12/14; continued improvement in his substantial wound on the right medial foot. There is still a necrotic area inferiorly with tightly adherent necrotic debris which I have been working on debriding each time she is here. She does not have an orthopedic appointment. Since last time she was here I looked over her cultures which were essentially MRSA on the foot wound and gram-negative rods in the abscess on the anterior leg. 12/21; continued improvement in the area on the  right medial foot. She is not up on this much and that is probably a good thing since I do not know it could support continuous ambulation. She has a small area on the right lateral leg which were remanence of the IandD's I did because of the abscess. I think she should probably have prophylactic antibiotics I am going to have to look this over to see if we can make an intelligent decision here. In the meantime her major wound is come down nicely.  Necrotic area inferiorly is still there but looks a lot better 04/06/2019; she has had some improvement in the overall surface area on the right medial foot somewhat narrowedr both but somewhat longer. The areas on the right lateral leg which were initial IandD sites are superficial. Nothing is present on the right heel. We are using silver alginate to the wound areas 1/18; right medial foot somewhat smaller. Still a deep probing area in the most distal recess of the wound. She has nothing open on the right leg. She has a new wound on the plantar aspect of her left fourth toe which may have come from just pulling skin. The patient using Medihoney on the wound on her foot under silver alginate. I cannot discourage her from this 2/1; 2-week follow-up using silver alginate on the right foot and her left fourth toe. The area on the right dorsal foot is contracted although there is still the deep area in the most distal part of the wound but still has some probing depth. No overt infection 2/15; 2-week follow-up. She continues to have improvement in the surface area on the dorsal right foot. Even the tunneling area from last time is almost closed. The area that was on the plantar part of her left fourth toe over the PIP is indeed closed 3/1; 2-week follow-up. Continued improvement in surface area. The original divot that we have been debriding inferiorly I think has full epithelialization although the epithelialization is gone down into the wound with probably 4 mm  of depth. Even under intense illumination I am unable to see anything open here. The remanence of the wound in this area actually look quite healthy. We have been using silver alginate 3/15; 2-week follow-up. Unfortunately not as good today. She has a comma shaped wound on the dorsal foot however the upper part of this is larger. Under illumination debris on the surface She also tells [place] that she was on her right leg 2 times in the last couple of weeks mostly to reach up for things above her head etc. She felt a sharp pain in the right leg which she thinks is somewhere from the ankle to the knee. The patient has neuropathy and is really uncertain. She cannot feel her foot so she does not think it was coming from there 3/29; 2-week follow-up. Her wound measures smaller. Surface of the wound appears reasonable. She is using silver alginate with underlying Medihoney. She has home health. X-rays I did of her tib-fib last time were negative although it did show arterial calcification 4/12; 2-week follow-up. Her wound measures smaller in length. Using manuka honey with silver alginate on top. She has home health. 4/26; 2-week follow-up. Her wound is smaller but still very adherent debris under illumination requiring debridement she has been using manuka honey with silver alginate. 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 Been using Hydrofera Blue. She is on suppressive Bactrim but apparently she has been taking it twice daily 6/7; I have not seen this wound and about 6 weeks. Since then she was up in [place]. By her own admission she was walking on the foot because she did not have a wheelchair. The wound is not nearly as healthy looking as it was the last time I saw this. We ordered different things for her but she only uses Medihoney and silver alginate. As far as I  know she is on suppressive  trimethoprim sulfamethoxazole. She does not admit to any fever or chills. Her CBGs apparently are at baseline however she is saying that she feels some discomfort on the lateral part of her ankle I looked over her last inflammatory markers from the summer 2020 at which time she had a deeply necrotic infected wound in this area. On 11/10/2018 her sedimentation rate was 56 and C-reactive protein 9.9. This was 107 and 29 on 07/29/2018. 6/17; the patient had a necrotic wound the last time she was here on the right dorsal foot. After debridement I did a culture. This showed a very resistant ESBL Klebsiella as well as Enterococcus. Her x-ray of the foot which was done because of warmth and some discomfort showed bone destruction within the carpal bones involving the navicular acute cuboid lateral middle cuneiforms but essentially unchanged from her prior study which was done on 10/29/2018. The findings were felt to represent chronic osteomyelitis. We did inflammatory markers on her. Her white count was 5.25 sedimentation rate 16 and C-reactive protein at 11.1. Notable for the fact that in August 2020 her CRP was 9.9 and sedimentation rate 56. I have looked at her x-rays. It is true that the bone destruction is very impressive however the patient came into this clinic for the wound on her right foot with pieces of bone literally falling out anteriorly with purulent material. I am not exactly sure I could have expected anything different. She has not been systemically unwell no fever chills or blood sugars have been reasonable. 6/28; she arrives with a right heel closed. The substantial area on the right anterior foot looks healthy. Much better looking surface. I think we can change to [facility] seems to help this previously. She is getting her antibiotics at dialysis she should be just about finished 7/9; changed to [facility] last week. Surface wound looks satisfactory not much change in surface area  however. She is going to [place] state next week this is usually a difficult thing for this patient follow-up will be for 2 weeks. 7/23; using Hydrofera Blue. She returns from her trip and the wound looks surprisingly good. Usually when this patient goes on trips she comes back with a lot of problems with the wound. She is saying that she sometimes feels an episodic "crunching" feeling on the lateral part of the foot. She is neuropathic and not feeling pain but wonders whether this could be a neuropathic dysesthesia. 11/13/19-Patient returns after 3 weeks, the wound itself is stable and patient states that there is nothing new going on she is on some extra anxiety medications and is resisting the temptation to pick at the dry skin around the wound. 9/20; patient has not been here in over a month and I have not seen her in 2 months. The wound in terms of size I think is about the same. There is no exposed bone. She has a nonviable surface on this. She is supposed to be using [facility] however she is also been using some form of honey preparation as well as a silver-based dressings. I do not think she has any pattern to this. 10/4; 2-week follow-up. Patient has been using some form of spray which she says has honey and silver to purchase this online she has been covering it with gauze. In spite of this the wound actually looks quite good. The deeper divot distally appears to be close down. There is a rim of epithelialization. 10/18; 2-week follow-up.  Patient has been using her Hydrofera Blue covered with her silver honey spray that she got online. 11/1; 2-week follow-up. She is using Hydrofera Blue with a silver honey spray. Wound bed is measuring smaller. She has noticed that her foot is warmer on the right. She is concerned about infection. For a long period of time I had her on prophylactic trimethoprim sulfamethoxazole DS 1 tablet daily. She is asking for this to be restarted. The patient is  walking on this foot because of repairs that are being done in a home her but her room is on the second floor she has to go up and down stairs. I have cautioned against this however as usual she will do exactly what she wants to do 11/15; 2-week follow-up. She uses Hydrofera Blue with a silver/honey spray which I have never heard of. I think her wound looks about the same. Some epithelialization. No evidence that this is infected. I think she is walking on this more than we agreed on. She is going on extensive vacation over Thanksgiving 12/3; 2-week follow-up. She is using Hydrofera Blue however over Thanksgiving she ran out of this and she is simply been using Medihoney. In spite of this her wound is smaller almost divided into 2 now. She traveled extensively over Thanksgiving and actually looks quite good in spite of this. Usually this is been a marker of problems for her 12/17; 2-week follow-up. She is using Hydrofera Blue. The wound is smaller. Debris on the surface of this is fibrinous. She is traveling to [place] over the holidays which never bodes well for her wounds. I think she is walking more on her feet then she is even willing to admit and she tells me she does walk 2/11; using Hydrofera Blue. Her wounds are contracting however she walks in the clinic with a history that she has not been able to eat she has had vomiting. She also has right eye problems. Her blood sugar was 567. She had blood work done at dialysis and we called there to get her blood work although it still had not returned although they should have this by the end of the day we were told. She also stated that she was not sure what her blood sugar was as her glucose monitor was not working and not coming to tomorrow. She also for some reason does not think her insulin pump is working well. Her endocrinologist is Dr. Elayne Snare 06/03/2020 upon evaluation today patient actually appears to be doing excellent in regard to her  wound. In fact she tells me it was not even bleeding until she picked a dry piece of skin off while we were getting ready to come in and see her today. Fortunately there is no signs of active infection which is great news and overall very pleased. She is going to be seeing her neurologist sometime shortly I believe it might even be on Monday. Nonetheless there can proceed with the work-up as far as anything else going on with her eye the fact that her right eyelid is drooping. 3/25; right medial dorsal foot. She has superficial areas here that appear to be fully epithelialized she is using Medihoney and silver alginate and some combination. ooShe has a new area on the mid left foot at roughly the level of the fifth metatarsal base 4/84/8; right medial dorsal foot. Still superficial areas here. I cleaned up 1 of these today she has been using I think mostly Medihoney but I would like  her to use silver alginate. The area on the mid part of her foot also looks improved on the left. Since she was last here she tells me that she noted pus in the area of her left foot. She told dialysis about this they did a culture and ultimately she is on IV antibiotics but were not sure which one. They referred her to [name] he said that he did not need to follow her. I have not really verified any of this and I do not know what antibiotic she is on at dialysis 4/29; patient has been using Medihoney to her wounds. She reports taking off the toenail to her left second toe. She states that home health discharged her and she has not had help with dressing changes. She denies any signs and symptoms of infection. 5/13; 2-week follow-up. Miraculously the wound on the right foot dorsally is healed. She has an area on the tip of her left third toe and an area in the left midfoot. 5/26; patient presents for 2-week follow-up. She has 2 open wounds 1 to her plantar foot and the other to the left third toe. She has noticed  increased swelling and redness to the toe wound. It is unclear exactly what she is doing for dressing changes as she has multiple dressing options at home. She states that it is easiest to do [facility] on the plantar wound and Prisma or Medihoney on the toe wound. 6/3; saw Dr. Maryjane Hurter week. She put her on Keflex because of the swelling I think of the left second toe. She arrives today with new breakdown x3 on the dorsal right foot which is disappointing. She also complains of pain in the right ankle, 3 liquid stools per day, chills without fever Objective Constitutional Patient is hypertensive.. Pulse regular and within target range for patient.Marland Kitchen Respirations regular, non-labored and within target range.. Temperature is normal and within the target range for the patient.Marland Kitchen Appears in no distress. Vitals Time Taken: 2:08 PM, Height: 67 in, Source: Stated, Weight: 125 lbs, Source: Stated, BMI: 19.6, Temperature: 98.7 F, Pulse: 84 bpm, Respiratory Rate: 18 breaths/min, Blood Pressure: 218/97 mmHg, Capillary Blood Glucose: 219 mg/dl. General Notes: glucose per pt report this am Respiratory work of breathing is normal. Psychiatric appears at normal baseline. General Notes: Wound exam oo Left plantar foot again eschared surface I did not remove this. oo Left third toe swollen and red its been like this for about 2 or 3 weeks. There is no tenderness in the joint. However this looks infected oo On the right dorsal foot she has 3 open areas in the original wound area Integumentary (Hair, Skin) I checked her shunt site in the left arm no erythema no evidence of infection. Wound #50 status is Open. Original cause of wound was Skin T ear/Laceration. The date acquired was: 06/24/2020. The wound has been in treatment 10 weeks. The wound is located on the [place]. The wound measures 0.2cm length x 0.2cm width x 0.3cm depth; 0.031cm^2 area and 0.009cm^3 volume. There is Fat Layer (Subcutaneous  Tissue) exposed. There is no tunneling noted, however, there is undermining starting at 12:00 and ending at 12:00 with a maximum distance of 0.3cm. There is a none present amount of drainage noted. The wound margin is well defined and not attached to the wound base. There is large (67- 100%) red granulation within the wound bed. There is no necrotic tissue within the wound bed. Wound #51 status is Open. Original cause  of wound was Trauma. The date acquired was: 07/29/2020. The wound has been in treatment 5 weeks. The wound is located on the Left T Second. The wound measures 0.8cm length x 0.9cm width x 0.1cm depth; 0.565cm^2 area and 0.057cm^3 volume. There is Fat Layer oe (Subcutaneous Tissue) exposed. There is no tunneling or undermining noted. There is a small amount of serosanguineous drainage noted. The wound margin is distinct with the outline attached to the wound base. There is large (67-100%) red granulation within the wound bed. There is a small (1-33%) amount of necrotic tissue within the wound bed including Eschar. Wound #52 status is Open. Original cause of wound was Gradually Appeared. The date acquired was: 09/02/2020. The wound is located on the Right Foot. The wound measures 2.2cm length x 1.8cm width x 0.2cm depth; 3.11cm^2 area and 0.622cm^3 volume. There is Fat Layer (Subcutaneous Tissue) exposed. There is no tunneling or undermining noted. There is a medium amount of serous drainage noted. The wound margin is distinct with the outline attached to the wound base. There is large (67-100%) red, pink granulation within the wound bed. There is a small (1-33%) amount of necrotic tissue within the wound bed including Adherent Slough. 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 - Dr. Dellia Nims Other: - Begin taking Doxycycline, stop taking Keflex Bathing/ Shower/ Hygiene: May shower and wash wound with soap and water. Edema Control - Lymphedema / SCD / Other: Elevate legs to the level of the heart or above for 30 minutes daily and/or when sitting, a frequency of: - throughout the day Avoid standing for long periods of time. Moisturize legs daily. - with dressing changes Off-Loading: Open toe surgical shoe to: - to both feet Other: - minimal weight bearing right foot WOUND #50: - Foot Wound Laterality: Plantar, Left Cleanser: Wound Cleanser Every Other Day/30 Days Discharge Instructions: Cleanse the wound with wound cleanser or normal saline prior to applying a clean dressing using gauze sponges, not tissue or cotton balls. Prim Dressing: Hydrofera Blue Classic Foam, 2x2 in Every Other Day/30 Days ary Discharge Instructions: Moisten with saline prior to applying to wound bed Secondary Dressing: Woven Gauze Sponge, Non-Sterile 4x4 in Every Other Day/30 Days Discharge Instructions: Apply over primary dressing as directed. Secured With: Elastic Bandage 4 inch (ACE bandage) Every Other Day/30 Days Discharge Instructions: Secure with ACE bandage as directed. Secured With: [employer], 4.5x3.1 (in/yd) Every Other Day/30 Days Discharge Instructions: Secure with Kerlix as directed. Secured With: Paper T ape, 2x10 (in/yd) Every Other Day/30 Days Discharge Instructions: Secure dressing with tape as directed. WOUND #51: - T Second Wound Laterality: Left oe Cleanser: Soap and Water Every Other Day/15 Days Discharge Instructions: May shower and wash wound with dial antibacterial soap and water prior to dressing change. Prim Dressing: Promogran Prisma Matrix, 4.34 (sq in) (silver collagen) Every Other Day/15 Days ary Discharge Instructions: Moisten  collagen with saline or hydrogel Secondary Dressing: Woven Gauze Sponges 2x2 in Every Other Day/15 Days Discharge Instructions: Apply over primary dressing as directed. Secured With: Child psychotherapist, Sterile 2x75 (in/in) Every Other Day/15 Days Discharge Instructions: Secure with stretch gauze as directed. Secured With: Paper T ape, 1x10 (in/yd) Every  Other Day/15 Days Discharge Instructions: Secure dressing with tape as directed. WOUND #52: - Foot Wound Laterality: Right Cleanser: Soap and Water Every Other Day/15 Days Discharge Instructions: May shower and wash wound with dial antibacterial soap and water prior to dressing change. Prim Dressing: Promogran Prisma Matrix, 4.34 (sq in) (silver collagen) Every Other Day/15 Days ary Discharge Instructions: Moisten collagen with saline or hydrogel Secondary Dressing: Woven Gauze Sponges 2x2 in Every Other Day/15 Days Discharge Instructions: Apply over primary dressing as directed. Secured With: Elastic Bandage 4 inch (ACE bandage) Every Other Day/15 Days Discharge Instructions: Secure with ACE bandage as directed. Secured With: Child psychotherapist, Sterile 2x75 (in/in) Every Other Day/15 Days Discharge Instructions: Secure with stretch gauze as directed. Secured With: Paper T ape, 1x10 (in/yd) Every Other Day/15 Days Discharge Instructions: Secure dressing with tape as directed. 1. We applied Prisma to the right dorsal foot and left second toe 2 Hydrofera Blue to the left dorsal foot 3. I was concerned about the complaints of chills. She has multiple sources of possible infection I could not really identify the cause of any of this nor convince myself she was actively infected. The ankle on the right she said was swollen and erythematous it looks satisfactory possibly somewhat warm in the right medial ankle. Because of the toe on the ankle I am going to put her on a course of doxycycline 4. She is traveling to  [place] next week I told her to monitor herself and seek medical attention if the diarrhea worsens she develops a fever etc. Electronic Signature(s) Signed: 09/03/2020 7:13:41 AM By: Linton Ham MD Entered By: Linton Ham on 09/02/2020 15:47:02 -------------------------------------------------------------------------------- SuperBill Details Patient Name: Date of Service: [name] 09/02/2020 Medical Record Number: [id number] Patient Account Number: [id number] Date of Birth/Sex: Treating RN: February 22, 1971 (50 y.o. 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

## 2020-09-07 NOTE — Progress Notes (Signed)
LABRIA, WOS ([id number]) Visit Report for 09/02/2020 Arrival Information Details Patient Name: Date of Service: Nancy Nancy [name] 09/02/2020 1:30 PM Medical Record Number: [id number] Patient Account Number: [id number] Date of Birth/Sex: Treating RN: 1970/10/17 (50 y.o. [name] Primary Care Vona Whiters: [name], [name] Other Clinician: Referring Jibri Schriefer: Treating Britt Petroni/Extender: [name], [name] Weeks in Treatment: 104 Visit Information History Since Last Visit Added or deleted any medications: Yes Patient Arrived: Wheel Chair Any new allergies or adverse reactions: No Arrival Time: 14:03 Had a fall or experienced change in No Accompanied By: self activities of daily living that may affect Transfer Assistance: None risk of falls: Patient Identification Verified: Yes Signs or symptoms of abuse/neglect since No Secondary Verification Process Completed: Yes last visito Patient Requires Transmission-Based Precautions: No Hospitalized since last visit: No Patient Has Alerts: No Implantable device outside of the clinic No excluding cellular tissue based products placed in the center since last visit: Has Dressing in Place as Prescribed: Yes Has Footwear/Offloading in Place as Yes Prescribed: Left: Surgical Shoe with Pressure Relief Insole Right: Surgical Shoe with Pressure Relief Insole Pain Present Now: Yes Electronic Signature(s) Signed: 09/02/2020 6:25:30 PM By: Baruch Gouty RN, BSN Entered By: Baruch Gouty on 09/02/2020 14:07:55 -------------------------------------------------------------------------------- Clinic Level of Care Assessment Details Patient Name: Date of Service: [name], Nancy [name] 09/02/2020 1:30 PM Medical Record Number: [id number] Patient Account Number: [id number] Date of Birth/Sex: Treating RN: [birth year] (50 y.o. [name] Primary Care Jedrek Dinovo: [name], [name] Other Clinician: Referring  Jermari Tamargo: Treating Helayne Metsker/Extender: [name], [name] Weeks in Treatment: 104 Clinic Level of Care Assessment Items TOOL 4 Quantity Score X- 1 0 Use when only an EandM is performed on FOLLOW-UP visit ASSESSMENTS - Nursing Assessment / Reassessment X- 1 10 Reassessment of Co-morbidities (includes updates in patient status) X- 1 5 Reassessment of Adherence to Treatment Plan ASSESSMENTS - Wound and Skin A ssessment / Reassessment _0  - 0 Simple Wound Assessment / Reassessment - one wound X- 3 5 Complex Wound Assessment / Reassessment - multiple wounds _1  - 0 Dermatologic / Skin Assessment (not related to wound area) ASSESSMENTS - Focused Assessment _2  - 0 Circumferential Edema Measurements - multi extremities _3  - 0 Nutritional Assessment / Counseling / Intervention _4  - 0 Lower Extremity Assessment (monofilament, tuning fork, pulses) _5  - 0 Peripheral Arterial Disease Assessment (using hand held doppler) ASSESSMENTS - Ostomy and/or Continence Assessment and Care _6  - 0 Incontinence Assessment and Management _7  - 0 Ostomy Care Assessment and Management (repouching, etc.) PROCESS - Coordination of Care _8  - 0 Simple Patient / Family Education for ongoing care X- 1 20 Complex (extensive) Patient / Family Education for ongoing care _9  - 0 Staff obtains Programmer, systems, Records, T Results / Process Orders est _10  - 0 Staff telephones HHA, Nursing Homes / Clarify orders / etc _11  - 0 Routine Transfer to another Facility (non-emergent condition) _12  - 0 Routine Hospital Admission (non-emergent condition) _13  - 0 New Admissions / Biomedical engineer / Ordering NPWT Apligraf, etc. , _14  - 0 Emergency Hospital Admission (emergent condition) _15  - 0 Simple Discharge Coordination _16  - 0 Complex (extensive) Discharge Coordination PROCESS - Special Needs _17  - 0 Pediatric / Minor Patient Management _18  - 0 Isolation Patient Management _19  - 0 Hearing / Language /  Visual special needs _20  - 0 Assessment of Community assistance (transportation, D/C planning, etc.) _21  - 0 Additional assistance / Altered mentation _22  -  0 Support Surface(s) Assessment (bed, cushion, seat, etc.) INTERVENTIONS - Wound Cleansing / Measurement _0  - 0 Simple Wound Cleansing - one wound X- 3 5 Complex Wound Cleansing - multiple wounds X- 1 5 Wound Imaging (photographs - any number of wounds) _1  - 0 Wound Tracing (instead of photographs) _2  - 0 Simple Wound Measurement - one wound X- 3 5 Complex Wound Measurement - multiple wounds INTERVENTIONS - Wound Dressings X - Small Wound Dressing one or multiple wounds 1 10 X- 2 15 Medium Wound Dressing one or multiple wounds _3  - 0 Large Wound Dressing one or multiple wounds <OYDXAJOINOMVEHMC>_9<\/OBSJGGEZMOQHUTML>_4  - 0 Application of Medications - topical <YTKPTWSFKCLEXNTZ>_0<\/YFVCBSWHQPRFFMBW>_4  - 0 Application of Medications - injection INTERVENTIONS - Miscellaneous _6  - 0 External ear exam _7  - 0 Specimen Collection (cultures, biopsies, blood, body fluids, etc.) _8  - 0 Specimen(s) / Culture(s) sent or taken to Lab for analysis _9  - 0 Patient Transfer (multiple staff / Civil Service fast streamer / Similar devices) _10  - 0 Simple Staple / Suture removal (25 or less) _11  - 0 Complex Staple / Suture removal (26 or more) _12  - 0 Hypo / Hyperglycemic Management (close monitor of Blood Glucose) _13  - 0 Ankle / Brachial Index (ABI) - do not check if billed separately X- 1 5 Vital Signs Has the patient been seen at the hospital within the last three years: Yes Total Score: 130 Level Of Care: New/Established - Level 4 Electronic Signature(s) Signed: 09/02/2020 6:03:14 PM By: Lorrin Jackson Entered By: Lorrin Jackson on 09/02/2020 15:00:20 -------------------------------------------------------------------------------- Encounter Discharge Information Details Patient Name: Date of Service: Nancy Nancy [name] 09/02/2020 1:30 PM Medical Record Number: [id number] Patient Account Number: [id number] Date of  Birth/Sex: Treating RN: 02-Oct-1970 (50 y.o. Nancy Fetter Primary Care Zoriah Pulice: [name], [name] Other Clinician: Referring Helene Bernstein: Treating Berkley Cronkright/Extender: [name], [name] Weeks in Treatment: 104 Encounter Discharge Information Items Discharge Condition: Stable Ambulatory Status: Wheelchair Discharge Destination: Home Transportation: Private Auto Accompanied By: alone Schedule Follow-up Appointment: Yes Clinical Summary of Care: Patient Declined Electronic Signature(s) Signed: 09/07/2020 5:57:05 PM By: [name] RN, BSN Entered By: [name] on 09/02/2020 17:31:18 -------------------------------------------------------------------------------- Lower Extremity Assessment Details Patient Name: Date of Service: Nancy Nancy [name] 09/02/2020 1:30 PM Medical Record Number: [id number] Patient Account Number: [id number] Date of Birth/Sex: Treating RN: 1970-12-18 (50 y.o. [name] Primary Care Juliyah Mergen: [name], [name] Other Clinician: Referring Lorilee Cafarella: Treating Terralyn Matsumura/Extender: [name], [name] Weeks in Treatment: 104 Edema Assessment Assessed: [Left: No] [Right: No] Edema: [Left: No] [Right: No] Calf Left: Right: Point of Measurement: From Medial Instep 31 cm Ankle Left: Right: Point of Measurement: From Medial Instep 21 cm Vascular Assessment Pulses: Dorsalis Pedis Palpable: [Left:Yes] [Right:Yes] Electronic Signature(s) Signed: 09/02/2020 6:25:30 PM By: Baruch Gouty RN, BSN Entered By: Baruch Gouty on 09/02/2020 14:17:56 -------------------------------------------------------------------------------- Multi Wound Chart Details Patient Name: Date of Service: Nancy Nancy [name] 09/02/2020 1:30 PM Medical Record Number: [id number] Patient Account Number: [id number] Date of Birth/Sex: Treating RN: September 20, 1970 (50 y.o. [name] Primary Care Axxel Gude: [name], [name] Other  Clinician: Referring Taquita Demby: Treating Malary Aylesworth/Extender: [name], [name] Weeks in Treatment: 104 Vital Signs Height(in): 67 Capillary Blood Glucose(mg/dl): 219 Weight(lbs): 125 Pulse(bpm): 84 Body Mass Index(BMI): 20 Blood Pressure(mmHg): 218/97 Temperature(F): 98.7 Respiratory Rate(breaths/min): 18 Photos: [50:No Photos Left, Plantar Foot] [51:No Photos Left T Second oe] [52:No Photos Right Foot] Wound Location: [50:Skin Tear/Laceration] [51:Trauma] [  52:Gradually Appeared] Wounding Event: [50:Diabetic Wound/Ulcer of the Lower] [51:Diabetic Wound/Ulcer of the Lower] [52:Diabetic Wound/Ulcer of the Lower] Primary Etiology: [50:Extremity Cataracts, Chronic sinus] [51:Extremity Cataracts, Chronic sinus] [52:Extremity Cataracts, Chronic sinus] Comorbid History: [50:problems/congestion, Anemia, Sleep problems/congestion, Anemia, Sleep problems/congestion, Anemia, Sleep Apnea, Deep Vein Thrombosis, Hypertension, Peripheral Arterial Disease, Type I Diabetes, Osteoarthritis, Osteomyelitis,  Neuropathy, Seizure Disorder 06/24/2020] [51:Apnea, Deep Vein Thrombosis, Hypertension, Peripheral Arterial Disease, Type I Diabetes, Osteoarthritis, Osteomyelitis, Neuropathy, Seizure Disorder 07/29/2020] [52:Apnea, Deep Vein Thrombosis, Hypertension,  Peripheral Arterial Disease, Type I Diabetes, Osteoarthritis, Osteomyelitis, Neuropathy, Seizure Disorder 09/02/2020] Date Acquired: [50:10] [51:5] [52:0] Weeks of Treatment: [50:Open] [51:Open] [52:Open] Wound Status: [50:No] [51:No] [52:Yes] Clustered Wound: [50:N/A] [51:N/A] [52:3] Clustered Quantity: [50:0.2x0.2x0.3] [51:0.8x0.9x0.1] [52:2.2x1.8x0.2] Measurements L x W x D (cm) [50:0.031] [51:0.565] [52:3.11] A (cm) : rea [50:0.009] [51:0.057] [52:0.622] Volume (cm) : [50:84.20%] [51:52.00%] [52:N/A] % Reduction in A rea: [50:76.90%] [51:83.90%] [52:N/A] % Reduction in Volume: [50:12] Starting Position 1 (o'clock): [50:12] Ending  Position 1 (o'clock): [50:0.3] Maximum Distance 1 (cm): [50:Yes] [51:No] [52:No] Undermining: [50:Grade 1] [51:Grade 2] [52:Grade 2] Classification: [50:None Present] [51:Small] [52:Medium] Exudate A mount: [50:N/A] [51:Serosanguineous] [52:Serous] Exudate Type: [50:N/A] [51:red, brown] [52:amber] Exudate Color: [50:Well defined, not attached] [51:Distinct, outline attached] [52:Distinct, outline attached] Wound Margin: [50:Large (67-100%)] [51:Large (67-100%)] [52:Large (67-100%)] Granulation A mount: [50:Red] [51:Red] [52:Red, Pink] Granulation Quality: [50:None Present (0%)] [51:Small (1-33%)] [52:Small (1-33%)] Necrotic A mount: [50:N/A] [51:Eschar] [52:Adherent Slough] Necrotic Tissue: [50:Fat Layer (Subcutaneous Tissue): Yes Fat Layer (Subcutaneous Tissue): Yes Fat Layer (Subcutaneous Tissue): Yes] Exposed Structures: [50:Fascia: No Tendon: No Muscle: No Joint: No Bone: No Small (1-33%)] [51:Fascia: No Tendon: No Muscle: No Joint: No Bone: No None] [52:Fascia: No Tendon: No Muscle: No Joint: No Bone: No Small (1-33%)] Treatment Notes Electronic Signature(s) Signed: 09/02/2020 6:03:14 PM By: Lorrin Jackson Signed: 09/03/2020 7:13:41 AM By: Linton Ham MD Entered By: Linton Ham on 09/02/2020 15:39:58 -------------------------------------------------------------------------------- Multi-Disciplinary Care Plan Details Patient Name: Date of Service: [name], Nancy [name] 09/02/2020 1:30 PM Medical Record Number: [id number] Patient Account Number: [id number] Date of Birth/Sex: Treating RN: Apr 01, 1971 (50 y.o. [name] Primary Care Dustine Stickler: [name], [name] Other Clinician: Referring Nitin Mckowen: Treating Camora Tremain/Extender: [name], [name] Weeks in Treatment: [place] reviewed with physician Active Inactive Nutrition Nursing Diagnoses: Imbalanced nutrition Impaired glucose control: actual or potential Potential for alteratiion  in Nutrition/Potential for imbalanced nutrition Goals: Patient/caregiver agrees to and verbalizes understanding of need to use nutritional supplements and/or vitamins as prescribed Date Initiated: 09/03/2018 Date Inactivated: 10/07/2018 Target Resolution Date: 10/01/2018 Goal Status: Met Patient/caregiver will maintain therapeutic glucose control Date Initiated: 09/03/2018 Target Resolution Date: 09/30/2020 Goal Status: Active Interventions: Provide education on elevated blood sugars and impact on wound healing Treatment Activities: Patient referred to Primary Care Physician for further nutritional evaluation : 09/03/2018 Notes: 07/29/20: Glucose control ongoing, target date extended. Wound/Skin Impairment Nursing Diagnoses: Impaired tissue integrity Knowledge deficit related to ulceration/compromised skin integrity Goals: Patient/caregiver will verbalize understanding of skin care regimen Date Initiated: 09/03/2018 Target Resolution Date: 09/30/2020 Goal Status: Active Ulcer/skin breakdown will have a volume reduction of 30% by week 4 Date Initiated: 09/03/2018 Date Inactivated: 10/07/2018 Target Resolution Date: 10/01/2018 Goal Status: Unmet Unmet Reason: COMORBITIES Ulcer/skin breakdown will have a volume reduction of 50% by week 8 Date Initiated: 10/07/2018 Date Inactivated: 11/03/2018 Target Resolution Date: 10/31/2018 Goal Status: Unmet Unmet Reason: Osteomyelitis Interventions: Assess patient/caregiver ability to obtain necessary supplies Assess patient/caregiver ability to perform ulcer/skin care regimen upon  admission and as needed Assess ulceration(s) every visit Provide education on ulcer and skin care Treatment Activities: Skin care regimen initiated : 09/03/2018 Topical wound management initiated : 09/03/2018 Notes: Electronic Signature(s) Signed: 09/02/2020 2:28:52 PM By: Lorrin Jackson Entered By: Lorrin Jackson on 09/02/2020  14:28:51 -------------------------------------------------------------------------------- Pain Assessment Details Patient Name: Date of Service: Nancy [name] 09/02/2020 1:30 PM Medical Record Number: [id number] Patient Account Number: [id number] Date of Birth/Sex: Treating RN: 01-27-1971 (50 y.o. [name] Primary Care Francis Doenges: [name], [name] Other Clinician: Referring Eleanna Theilen: Treating Tylerjames Hoglund/Extender: [name], [name] Weeks in Treatment: 104 Active Problems Location of Pain Severity and Description of Pain Patient Has Paino Yes Site Locations Pain Location: Generalized Pain With Dressing Change: No Duration of the Pain. Constant / Intermittento Intermittent Rate the pain. Current Pain Level: 2 Worst Pain Level: 5 Least Pain Level: 0 Character of Pain Describe the Pain: Aching, Tender Pain Management and Medication Current Pain Management: Is the Current Pain Management Adequate: Adequate How does your wound impact your activities of daily livingo Sleep: No Bathing: Yes Appetite: No Relationship With Others: No Bladder Continence: No Emotions: No Bowel Continence: No Work: No Toileting: No Drive: No Dressing: No Hobbies: No Electronic Signature(s) Signed: 09/02/2020 6:25:30 PM By: Baruch Gouty RN, BSN Entered By: Baruch Gouty on 09/02/2020 14:10:29 -------------------------------------------------------------------------------- Patient/Caregiver Education Details Patient Name: Date of Service: Nancy Nancy [name] 6/3/2022andnbsp1:30 PM Medical Record Number: [id number] Patient Account Number: [id number] Date of Birth/Gender: Treating RN: May 05, 1970 (50 y.o. [name] Primary Care Physician: [name], [name] Other Clinician: Referring Physician: Treating Physician/Extender: [name], [name] Weeks in Treatment: 104 Education Assessment Education Provided To: Patient Education  Topics Provided Wound/Skin Impairment: Methods: Explain/Verbal, Printed Responses: State content correctly Electronic Signature(s) Signed: 09/02/2020 6:03:14 PM By: Lorrin Jackson Entered By: Lorrin Jackson on 09/02/2020 14:29:20 -------------------------------------------------------------------------------- Wound Assessment Details Patient Name: Date of Service: Nancy [name] 09/02/2020 1:30 PM Medical Record Number: [id number] Patient Account Number: [id number] Date of Birth/Sex: Treating RN: February 24, 1971 (50 y.o. [name] Primary Care Sydnei Ohaver: [name], [name] Other Clinician: Referring Siyah Mault: Treating Ashtan Girtman/Extender: [name], [name] Weeks in Treatment: 104 Wound Status Wound Number: 6 Primary Diabetic Wound/Ulcer of the Lower Extremity Etiology: Wound Location: Left, Plantar Foot Wound Open Wounding Event: Skin Tear/Laceration Status: Date Acquired: 06/24/2020 Comorbid Cataracts, Chronic sinus problems/congestion, Anemia, Sleep Weeks Of Treatment: 10 History: Apnea, Deep Vein Thrombosis, Hypertension, Peripheral Arterial Clustered Wound: No Disease, Type I Diabetes, Osteoarthritis, Osteomyelitis, Neuropathy, Seizure Disorder Photos Wound Measurements Length: (cm) 0.2 Width: (cm) 0.2 Depth: (cm) 0.3 Area: (cm) 0.031 Volume: (cm) 0.009 % Reduction in Area: 84.2% % Reduction in Volume: 76.9% Epithelialization: Small (1-33%) Tunneling: No Undermining: Yes Starting Position (o'clock): 12 Ending Position (o'clock): 12 Maximum Distance: (cm) 0.3 Wound Description Classification: Grade 1 Wound Margin: Well defined, not attached Exudate Amount: None Present Foul Odor After Cleansing: No Slough/Fibrino No Wound Bed Granulation Amount: Large (67-100%) Exposed Structure Granulation Quality: Red Fascia Exposed: No Necrotic Amount: None Present (0%) Fat Layer (Subcutaneous Tissue) Exposed: Yes Tendon Exposed: No Muscle  Exposed: No Joint Exposed: No Bone Exposed: No Treatment Notes Wound #50 (Foot) Wound Laterality: Plantar, Left Cleanser Wound Cleanser Discharge Instruction: Cleanse the wound with wound cleanser or normal saline prior to applying a clean dressing using gauze sponges, not tissue or cotton balls. Peri-Wound Care Topical Primary Dressing Hydrofera Blue Classic  Foam, 2x2 in Discharge Instruction: Moisten with saline prior to applying to wound bed Secondary Dressing Woven Gauze Sponge, Non-Sterile 4x4 in Discharge Instruction: Apply over primary dressing as directed. Secured With Elastic Bandage 4 inch (ACE bandage) Discharge Instruction: Secure with ACE bandage as directed. Kerlix Roll Sterile, 4.5x3.1 (in/yd) Discharge Instruction: Secure with Kerlix as directed. Paper Tape, 2x10 (in/yd) Discharge Instruction: Secure dressing with tape as directed. Compression Wrap Compression Stockings Add-Ons Electronic Signature(s) Signed: 09/02/2020 5:48:41 PM By: Sandre Kitty Signed: 09/02/2020 6:25:30 PM By: Baruch Gouty RN, BSN Entered By: Sandre Kitty on 09/02/2020 17:45:23 -------------------------------------------------------------------------------- Wound Assessment Details Patient Name: Date of Service: Nancy Nancy [name] 09/02/2020 1:30 PM Medical Record Number: [id number] Patient Account Number: [id number] Date of Birth/Sex: Treating RN: 1970-09-11 (50 y.o. [name] Primary Care Cherrie Franca: [name], [name] Other Clinician: Referring Loralye Loberg: Treating Gaddiel Cullens/Extender: [name], [name] Weeks in Treatment: 104 Wound Status Wound Number: 51 Primary Diabetic Wound/Ulcer of the Lower Extremity Etiology: Wound Location: Left T Second oe Wound Open Wounding Event: Trauma Status: Date Acquired: 07/29/2020 Comorbid Cataracts, Chronic sinus problems/congestion, Anemia, Sleep Weeks Of Treatment: 5 History: Apnea, Deep Vein Thrombosis,  Hypertension, Peripheral Arterial Clustered Wound: No Disease, Type I Diabetes, Osteoarthritis, Osteomyelitis, Neuropathy, Seizure Disorder Photos Wound Measurements Length: (cm) 0.8 Width: (cm) 0.9 Depth: (cm) 0.1 Area: (cm) 0.565 Volume: (cm) 0.057 % Reduction in Area: 52% % Reduction in Volume: 83.9% Epithelialization: None Tunneling: No Undermining: No Wound Description Classification: Grade 2 Wound Margin: Distinct, outline attached Exudate Amount: Small Exudate Type: Serosanguineous Exudate Color: red, brown Foul Odor After Cleansing: No Slough/Fibrino Yes Wound Bed Granulation Amount: Large (67-100%) Exposed Structure Granulation Quality: Red Fascia Exposed: No Necrotic Amount: Small (1-33%) Fat Layer (Subcutaneous Tissue) Exposed: Yes Necrotic Quality: Eschar Tendon Exposed: No Muscle Exposed: No Joint Exposed: No Bone Exposed: No Treatment Notes Wound #51 (Toe Second) Wound Laterality: Left Cleanser Soap and Water Discharge Instruction: May shower and wash wound with dial antibacterial soap and water prior to dressing change. Peri-Wound Care Topical Primary Dressing Promogran Prisma Matrix, 4.34 (sq in) (silver collagen) Discharge Instruction: Moisten collagen with saline or hydrogel Secondary Dressing Woven Gauze Sponges 2x2 in Discharge Instruction: Apply over primary dressing as directed. Secured With Paper Tape, 1x10 (in/yd) Discharge Instruction: Secure dressing with tape as directed. Conforming Stretch Gauze Bandage, Sterile 2x75 (in/in) Discharge Instruction: Secure with stretch gauze as directed. Compression Wrap Compression Stockings Add-Ons Electronic Signature(s) Signed: 09/02/2020 5:48:41 PM By: Sandre Kitty Signed: 09/02/2020 6:25:30 PM By: Baruch Gouty RN, BSN Entered By: Sandre Kitty on 09/02/2020 17:45:58 -------------------------------------------------------------------------------- Wound Assessment Details Patient  Name: Date of Service: Nancy Nancy [name] 09/02/2020 1:30 PM Medical Record Number: [id number] Patient Account Number: [id number] Date of Birth/Sex: Treating RN: 08-Sep-1970 (50 y.o. [name] Primary Care Jabree Rebert: [name], [name] Other Clinician: Referring Armon Orvis: Treating Timmya Blazier/Extender: [name], [name] Weeks in Treatment: 104 Wound Status Wound Number: 52 Primary Diabetic Wound/Ulcer of the Lower Extremity Etiology: Wound Location: Right Foot Wound Open Wounding Event: Gradually Appeared Status: Date Acquired: 09/02/2020 Comorbid Cataracts, Chronic sinus problems/congestion, Anemia, Sleep Weeks Of Treatment: 0 History: Apnea, Deep Vein Thrombosis, Hypertension, Peripheral Arterial Clustered Wound: Yes Disease, Type I Diabetes, Osteoarthritis, Osteomyelitis, Neuropathy, Seizure Disorder Photos Wound Measurements Length: (cm) 2.2 Width: (cm) 1.8 Depth: (cm) 0.2 Clustered Quantity: 3 Area: (cm) 3.11 Volume: (cm) 0.622 % Reduction in Area: 0% % Reduction in Volume: 0% Epithelialization: Small (1-33%)  Tunneling: No Undermining: No Wound Description Classification: Grade 2 Wound Margin: Distinct, outline attached Exudate Amount: Medium Exudate Type: Serous Exudate Color: amber Foul Odor After Cleansing: No Slough/Fibrino Yes Wound Bed Granulation Amount: Large (67-100%) Exposed Structure Granulation Quality: Red, Pink Fascia Exposed: No Necrotic Amount: Small (1-33%) Fat Layer (Subcutaneous Tissue) Exposed: Yes Necrotic Quality: Adherent Slough Tendon Exposed: No Muscle Exposed: No Joint Exposed: No Bone Exposed: No Treatment Notes Wound #52 (Foot) Wound Laterality: Right Cleanser Soap and Water Discharge Instruction: May shower and wash wound with dial antibacterial soap and water prior to dressing change. Peri-Wound Care Topical Primary Dressing Promogran Prisma Matrix, 4.34 (sq in) (silver collagen) Discharge  Instruction: Moisten collagen with saline or hydrogel Secondary Dressing Woven Gauze Sponges 2x2 in Discharge Instruction: Apply over primary dressing as directed. Secured With Elastic Bandage 4 inch (ACE bandage) Discharge Instruction: Secure with ACE bandage as directed. Paper Tape, 1x10 (in/yd) Discharge Instruction: Secure dressing with tape as directed. Conforming Stretch Gauze Bandage, Sterile 2x75 (in/in) Discharge Instruction: Secure with stretch gauze as directed. Compression Wrap Compression Stockings Add-Ons Electronic Signature(s) Signed: 09/02/2020 5:48:41 PM By: Sandre Kitty Signed: 09/02/2020 6:25:30 PM By: Baruch Gouty RN, BSN Entered By: Sandre Kitty on 09/02/2020 17:44:53 -------------------------------------------------------------------------------- Vitals Details Patient Name: Date of Service: Nancy Nancy [place] [name] 09/02/2020 1:30 PM Medical Record Number: [id number] Patient Account Number: [id number] Date of Birth/Sex: Treating RN: Apr 02, 1971 (50 y.o. [name] Primary Care Shanele Nissan: [name], [name] Other Clinician: Referring Jeanita Carneiro: Treating Jeannelle Wiens/Extender: [name], [name] Weeks in Treatment: 104 Vital Signs Time Taken: 14:08 Temperature (F): 98.7 Height (in): 67 Pulse (bpm): 84 Source: Stated Respiratory Rate (breaths/min): 18 Weight (lbs): 125 Blood Pressure (mmHg): 218/97 Source: Stated Capillary Blood Glucose (mg/dl): 219 Body Mass Index (BMI): 19.6 Reference Range: 80 - 120 mg / dl Notes glucose per pt report this am Electronic Signature(s) Signed: 09/02/2020 6:25:30 PM By: Baruch Gouty RN, BSN Entered By: Baruch Gouty on 09/02/2020 14:09:19

## 2020-09-16 ENCOUNTER — Encounter (HOSPITAL_BASED_OUTPATIENT_CLINIC_OR_DEPARTMENT_OTHER): Payer: Medicare HMO | Admitting: Internal Medicine

## 2020-09-16 ENCOUNTER — Other Ambulatory Visit: Payer: Self-pay

## 2020-09-16 DIAGNOSIS — E10621 Type 1 diabetes mellitus with foot ulcer: Secondary | ICD-10-CM | POA: Diagnosis not present

## 2020-09-16 NOTE — Progress Notes (Addendum)
SENNA, LAPE ([id number]) Visit Report for 09/16/2020 Arrival Information Details Patient Name: Date of Service: [name] 09/16/2020 12:45 PM Medical Record Number: [id number] Patient Account Number: [id number] Date of Birth/Sex: Treating RN: 23-Jul-1970 (50 y.o. [name] Primary Care [name]: [name], [name] Other Clinician: Referring [name]: Treating [name]/Extender: [name], [name] Weeks in Treatment: 106 Visit Information History Since Last Visit Added or deleted any medications: No Patient Arrived: Wheel Chair Any new allergies or adverse reactions: No Arrival Time: 13:00 Had a fall or experienced change in No Accompanied By: self activities of daily living that may affect Transfer Assistance: None risk of falls: Patient Identification Verified: Yes Signs or symptoms of abuse/neglect since No Secondary Verification Process Completed: Yes last visito Patient Requires Transmission-Based Precautions: No Hospitalized since last visit: Yes Patient Has Alerts: No Implantable device outside of the clinic No excluding cellular tissue based products placed in the center since last visit: Has Dressing in Place as Prescribed: Yes Has Footwear/Offloading in Place as Yes Prescribed: Left: Surgical Shoe with Pressure Relief Insole Right: Surgical Shoe with Pressure Relief Insole Pain Present Now: No Electronic Signature(s) Signed: 09/16/2020 5:33:29 PM By: Baruch Gouty RN, BSN Entered By: Baruch Gouty on 09/16/2020 13:01:03 -------------------------------------------------------------------------------- Encounter Discharge Information Details Patient Name: Date of Service: [place] LLO [address] [name] [name] 09/16/2020 12:45 PM Medical Record Number: [id number] Patient Account Number: [id number] Date of Birth/Sex: Treating RN: [birth year] (50 y.o. [name] Primary Care Gaetano Romberger: [name], [name] Other Clinician: Referring  Clema Skousen: Treating [name]/Extender: [name], [name] Weeks in Treatment: 106 Encounter Discharge Information Items Post Procedure Vitals Discharge Condition: Stable Temperature (F): 99.1 Ambulatory Status: Wheelchair Pulse (bpm): 80 Discharge Destination: Home Respiratory Rate (breaths/min): 16 Transportation: Private Auto Blood Pressure (mmHg): 146/70 Accompanied By: self Schedule Follow-up Appointment: Yes Clinical Summary of Care: Electronic Signature(s) Signed: 09/16/2020 4:58:53 PM By: Deon Pilling Entered By: Deon Pilling on 09/16/2020 16:31:52 -------------------------------------------------------------------------------- Lower Extremity Assessment Details Patient Name: Date of Service: [name] 09/16/2020 12:45 PM Medical Record Number: [id number] Patient Account Number: [id number] Date of Birth/Sex: Treating RN: 08-20-1970 (50 y.o. [name] Primary Care Kjerstin Abrigo: [name], [name] Other Clinician: Referring Dianca Owensby: Treating [name]/Extender: [name], [name] Weeks in Treatment: 106 Edema Assessment Assessed: [Left: No] [Right: No] Edema: [Left: No] [Right: No] Calf Left: Right: Point of Measurement: From Medial Instep 31.4 cm 31 cm Ankle Left: Right: Point of Measurement: From Medial Instep 21 cm 21 cm Vascular Assessment Pulses: Dorsalis Pedis Palpable: [Left:No] [Right:No] Electronic Signature(s) Signed: 09/16/2020 5:33:29 PM By: Baruch Gouty RN, BSN Entered By: Baruch Gouty on 09/16/2020 13:09:47 -------------------------------------------------------------------------------- Multi Wound Chart Details Patient Name: Date of Service: [name] 09/16/2020 12:45 PM Medical Record Number: [id number] Patient Account Number: [id number] Date of Birth/Sex: Treating RN: 05/07/1970 (50 y.o. [name] Primary Care Clevland Cork: [name], [name] Other Clinician: Referring  Marri Mcneff: Treating [name]/Extender: [name], [name] Weeks in Treatment: 106 Vital Signs Height(in): 67 Capillary Blood Glucose(mg/dl): 124 Weight(lbs): 125 Pulse(bpm): 80 Body Mass Index(BMI): 20 Blood Pressure(mmHg): 146/70 Temperature(F): 99.1 Respiratory Rate(breaths/min): 18 Photos: [50:No Photos Left, Plantar Foot] [51:No Photos Left T Second oe] [52:No Photos Right Foot] Wound Location: [50:Skin Tear/Laceration] [51:Trauma] [52:Gradually Appeared] Wounding Event: [50:Diabetic Wound/Ulcer of the Lower] [51:Diabetic Wound/Ulcer of the Lower] [52:Diabetic Wound/Ulcer of the Lower] Primary Etiology: [50:Extremity Cataracts, Chronic  sinus] [51:Extremity Cataracts, Chronic sinus] [52:Extremity Cataracts, Chronic sinus] Comorbid History: [50:problems/congestion, Anemia, Sleep problems/congestion, Anemia, Sleep problems/congestion, Anemia, Sleep Apnea, Deep Vein Thrombosis, Hypertension, Peripheral Arterial Disease, Type I Diabetes, Osteoarthritis, Osteomyelitis,  Neuropathy, Seizure Disorder 06/24/2020] [51:Apnea, Deep Vein Thrombosis, Hypertension, Peripheral Arterial Disease, Type I Diabetes, Osteoarthritis, Osteomyelitis, Neuropathy, Seizure Disorder 07/29/2020] [52:Apnea, Deep Vein Thrombosis, Hypertension,  Peripheral Arterial Disease, Type I Diabetes, Osteoarthritis, Osteomyelitis, Neuropathy, Seizure Disorder 09/02/2020] Date Acquired: [50:12] [51:7] [52:2] Weeks of Treatment: [50:Open] [51:Open] [52:Open] Wound Status: [50:No] [51:No] [52:Yes] Clustered Wound: [50:N/A] [51:N/A] [52:3] Clustered Quantity: [50:0.3x0.3x0.3] [51:0.6x0.7x0.1] [52:1.5x1.5x0.1] Measurements L x W x D (cm) [50:0.071] [51:0.33] [52:1.767] A (cm) : rea [50:0.021] [51:0.033] [52:0.177] Volume (cm) : [50:63.80%] [51:72.00%] [52:43.20%] % Reduction in A [50:rea: 46.20%] [51:90.70%] [52:71.50%] % Reduction in Volume: [50:3] Position 1 (o'clock): [50:1.6] Maximum Distance 1 (cm):  [50:12] Starting Position 1 (o'clock): [50:12] Ending Position 1 (o'clock): [50:0.3] Maximum Distance 1 (cm): [50:Yes] [51:No] [52:No] Tunneling: [50:Yes] [51:No] [52:No] Undermining: [50:Grade 1] [51:Grade 2] [52:Grade 2] Classification: [50:Small] [51:Small] [52:Medium] Exudate A mount: [50:Serosanguineous] [51:Serosanguineous] [52:Serous] Exudate Type: [50:red, brown] [51:red, brown] [52:amber] Exudate Color: [50:Well defined, not attached] [51:Distinct, outline attached] [52:Distinct, outline attached] Wound Margin: [50:Large (67-100%)] [51:Large (67-100%)] [52:Small (1-33%)] Granulation A mount: [50:Red] [51:Red] [52:Pink] Granulation Quality: [50:None Present (0%)] [51:None Present (0%)] [52:Large (67-100%)] Necrotic A mount: [50:Fat Layer (Subcutaneous Tissue): Yes Fat Layer (Subcutaneous Tissue): Yes Fat Layer (Subcutaneous Tissue): Yes] Exposed Structures: [50:Fascia: No Tendon: No Muscle: No Joint: No Bone: No None] [51:Fascia: No Tendon: No Muscle: No Joint: No Bone: No Small (1-33%)] [52:Fascia: No Tendon: No Muscle: No Joint: No Bone: No Small (1-33%)] Epithelialization: [50:Debridement - Excisional] [51:N/A] [52:N/A] Debridement: Pre-procedure Verification/Time Out 13:29 [51:N/A] [52:N/A] Taken: [50:Subcutaneous] [51:N/A] [52:N/A] Tissue Debrided: [50:Skin/Subcutaneous Tissue] [51:N/A] [52:N/A] Level: [50:0.09] [51:N/A] [52:N/A] Debridement A (sq cm): [50:rea Curette] [51:N/A] [52:N/A] Instrument: [50:Moderate] [51:N/A] [52:N/A] Bleeding: [50:Pressure] [51:N/A] [52:N/A] Hemostasis A chieved: [50:Procedure was tolerated well] [51:N/A] [52:N/A] Debridement Treatment Response: [50:1.5x1.5x0.3] [51:N/A] [52:N/A] Post Debridement Measurements L x W x D (cm) [50:0.53] [51:N/A] [52:N/A] Post Debridement Volume: (cm) [50:Debridement] [51:N/A] [52:N/A] Treatment Notes Electronic Signature(s) Signed: 09/16/2020 5:27:23 PM By: Linton Ham MD Signed: 09/16/2020 5:38:00 PM By:  Lorrin Jackson Entered By: Linton Ham on 09/16/2020 13:42:20 -------------------------------------------------------------------------------- Multi-Disciplinary Care Plan Details Patient Name: Date of Service: [place] LLO [address] [name] [name] 09/16/2020 12:45 PM Medical Record Number: [id number] Patient Account Number: [id number] Date of Birth/Sex: Treating RN: 15-Sep-1970 (50 y.o. [name] Primary Care Babacar Haycraft: [name], [name] Other Clinician: Referring Jahlil Ziller: Treating Ethin Drummond/Extender: [name], [name] Weeks in Treatment: [place] reviewed with physician Active Inactive Nutrition Nursing Diagnoses: Imbalanced nutrition Impaired glucose control: actual or potential Potential for alteratiion in Nutrition/Potential for imbalanced nutrition Goals: Patient/caregiver agrees to and verbalizes understanding of need to use nutritional supplements and/or vitamins as prescribed Date Initiated: 09/03/2018 Date Inactivated: 10/07/2018 Target Resolution Date: 10/01/2018 Goal Status: Met Patient/caregiver will maintain therapeutic glucose control Date Initiated: 09/03/2018 Target Resolution Date: 09/30/2020 Goal Status: Active Interventions: Provide education on elevated blood sugars and impact on wound healing Treatment Activities: Patient referred to Primary Care Physician for further nutritional evaluation : 09/03/2018 Notes: 07/29/20: Glucose control ongoing, target date extended. Wound/Skin Impairment Nursing Diagnoses: Impaired tissue integrity Knowledge deficit related to ulceration/compromised skin integrity Goals: Patient/caregiver will verbalize understanding of skin care regimen Date Initiated: 09/03/2018 Target Resolution Date: 09/30/2020 Goal Status: Active Ulcer/skin breakdown will have a volume reduction of 30% by week 4 Date  Initiated: 09/03/2018 Date Inactivated: 10/07/2018 Target Resolution Date: 10/01/2018 Goal Status: Unmet Unmet  Reason: COMORBITIES Ulcer/skin breakdown will have a volume reduction of 50% by week 8 Date Initiated: 10/07/2018 Date Inactivated: 11/03/2018 Target Resolution Date: 10/31/2018 Goal Status: Unmet Unmet Reason: Osteomyelitis Interventions: Assess patient/caregiver ability to obtain necessary supplies Assess patient/caregiver ability to perform ulcer/skin care regimen upon admission and as needed Assess ulceration(s) every visit Provide education on ulcer and skin care Treatment Activities: Skin care regimen initiated : 09/03/2018 Topical wound management initiated : 09/03/2018 Notes: Electronic Signature(s) Signed: 09/16/2020 12:57:00 PM By: Lorrin Jackson Entered By: Lorrin Jackson on 09/16/2020 12:56:59 -------------------------------------------------------------------------------- Pain Assessment Details Patient Name: Date of Service: [name] 09/16/2020 12:45 PM Medical Record Number: [id number] Patient Account Number: [id number] Date of Birth/Sex: Treating RN: 07-18-1970 (50 y.o. [name] Primary Care Shaunna Rosetti: [name], [name] Other Clinician: Referring Caylan Schifano: Treating Vihana Kydd/Extender: [name], [name] Weeks in Treatment: 106 Active Problems Location of Pain Severity and Description of Pain Patient Has Paino No Site Locations Rate the pain. Current Pain Level: 0 Pain Management and Medication Current Pain Management: Electronic Signature(s) Signed: 09/16/2020 5:33:29 PM By: Baruch Gouty RN, BSN Entered By: Baruch Gouty on 09/16/2020 13:01:21 -------------------------------------------------------------------------------- Patient/Caregiver Education Details Patient Name: Date of Service: [place] LLO Abbott Pao Placed[place] 6/17/2022andnbsp12:45 PM Medical Record Number: [id number] Patient Account Number: [id number] Date of Birth/Gender: Treating RN: 02/09/1971 (50 y.o. [name] Primary Care Physician: [name], NIA  LL Other Clinician: Referring Physician: Treating Physician/Extender: [name], [name] Weeks in Treatment: 106 Education Assessment Education Provided To: Patient Education Topics Provided Offloading: Methods: Explain/Verbal, Printed Responses: State content correctly Wound/Skin Impairment: Methods: Explain/Verbal, Printed Responses: State content correctly Electronic Signature(s) Signed: 09/16/2020 5:38:00 PM By: Lorrin Jackson Entered By: Lorrin Jackson on 09/16/2020 12:58:45 -------------------------------------------------------------------------------- Wound Assessment Details Patient Name: Date of Service: [name] 09/16/2020 12:45 PM Medical Record Number: [id number] Patient Account Number: [id number] Date of Birth/Sex: Treating RN: [birth year] (50 y.o. [name] Primary Care Deira Shimer: [name], [name] Other Clinician: Referring Blakeley Scheier: Treating Dalyce Renne/Extender: [name], [name] Weeks in Treatment: 106 Wound Status Wound Number: 50 Primary Diabetic Wound/Ulcer of the Lower Extremity Etiology: Wound Location: Left, Plantar Foot Wound Open Wounding Event: Skin Tear/Laceration Status: Date Acquired: 06/24/2020 Comorbid Cataracts, Chronic sinus problems/congestion, Anemia, Sleep Weeks Of Treatment: 12 History: Apnea, Deep Vein Thrombosis, Hypertension, Peripheral Arterial Clustered Wound: No Disease, Type I Diabetes, Osteoarthritis, Osteomyelitis, Neuropathy, Seizure Disorder Photos Wound Measurements Length: (cm) 0.3 % R Width: (cm) 0.3 % R Depth: (cm) 0.3 Epi Area: (cm) 0.071 Tu Volume: (cm) 0.021 eduction in Area: 63.8% eduction in Volume: 46.2% thelialization: None nneling: Yes Position (o'clock): 3 Maximum Distance: (cm) 1.6 Undermining: Yes Starting Position (o'clock): 12 Ending Position (o'clock): 12 Maximum Distance: (cm) 0.3 Wound Description Classification: Grade 1 Fou Wound Margin:  Well defined, not attached Slo Exudate Amount: Small Exudate Type: Serosanguineous Exudate Color: red, brown l Odor After Cleansing: No ugh/Fibrino No Wound Bed Granulation Amount: Large (67-100%) Exposed Structure Granulation Quality: Red Fascia Exposed: No Necrotic Amount: None Present (0%) Fat Layer (Subcutaneous Tissue) Exposed: Yes Tendon Exposed: No Muscle Exposed: No Joint Exposed: No Bone Exposed: No Treatment Notes Wound #50 (Foot) Wound Laterality: Plantar, Left Cleanser Wound Cleanser Discharge Instruction: Cleanse the wound with wound cleanser or normal saline prior to applying a clean dressing using gauze sponges, not tissue or cotton  balls. Peri-Wound Care Topical Primary Dressing KerraCel Ag Gelling Fiber Dressing, 2x2 in (silver alginate) Discharge Instruction: Apply silver alginate to wound bed as instructed Secondary Dressing Woven Gauze Sponge, Non-Sterile 4x4 in Discharge Instruction: Apply over primary dressing as directed. Secured With Elastic Bandage 4 inch (ACE bandage) Discharge Instruction: Secure with ACE bandage as directed. Kerlix Roll Sterile, 4.5x3.1 (in/yd) Discharge Instruction: Secure with Kerlix as directed. Paper Tape, 2x10 (in/yd) Discharge Instruction: Secure dressing with tape as directed. Compression Wrap Compression Stockings Add-Ons Electronic Signature(s) Signed: 09/19/2020 6:32:01 PM By: Baruch Gouty RN, BSN Signed: 09/22/2020 8:38:46 AM By: Leane Call Previous Signature: 09/16/2020 5:33:29 PM Version By: Baruch Gouty RN, BSN Entered By: Leane Call on 09/19/2020 15:59:09 -------------------------------------------------------------------------------- Wound Assessment Details Patient Name: Date of Service: [name] 09/16/2020 12:45 PM Medical Record Number: [id number] Patient Account Number: [id number] Date of Birth/Sex: Treating RN: 09/10/1970 (50 y.o. [name] Primary Care  Rafia Shedden: [name], [name] Other Clinician: Referring Jamela Cumbo: Treating Deane Melick/Extender: [name], [name] Weeks in Treatment: 106 Wound Status Wound Number: 51 Primary Diabetic Wound/Ulcer of the Lower Extremity Etiology: Wound Location: Left T Second oe Wound Open Wounding Event: Trauma Status: Date Acquired: 07/29/2020 Comorbid Cataracts, Chronic sinus problems/congestion, Anemia, Sleep Weeks Of Treatment: 7 History: Apnea, Deep Vein Thrombosis, Hypertension, Peripheral Arterial Clustered Wound: No Disease, Type I Diabetes, Osteoarthritis, Osteomyelitis, Neuropathy, Seizure Disorder Photos Wound Measurements Length: (cm) 0.6 Width: (cm) 0.7 Depth: (cm) 0.1 Area: (cm) 0.33 Volume: (cm) 0.033 % Reduction in Area: 72% % Reduction in Volume: 90.7% Epithelialization: Small (1-33%) Tunneling: No Undermining: No Wound Description Classification: Grade 2 Wound Margin: Distinct, outline attached Exudate Amount: Small Exudate Type: Serosanguineous Exudate Color: red, brown Foul Odor After Cleansing: No Slough/Fibrino Yes Wound Bed Granulation Amount: Large (67-100%) Exposed Structure Granulation Quality: Red Fascia Exposed: No Necrotic Amount: None Present (0%) Fat Layer (Subcutaneous Tissue) Exposed: Yes Tendon Exposed: No Muscle Exposed: No Joint Exposed: No Bone Exposed: No Treatment Notes Wound #51 (Toe Second) Wound Laterality: Left Cleanser Soap and Water Discharge Instruction: May shower and wash wound with dial antibacterial soap and water prior to dressing change. Peri-Wound Care Topical Primary Dressing KerraCel Ag Gelling Fiber Dressing, 4x5 in (silver alginate) Discharge Instruction: Apply silver alginate to wound bed as instructed Secondary Dressing Woven Gauze Sponges 2x2 in Discharge Instruction: Apply over primary dressing as directed. Secured With Conforming Stretch Gauze Bandage, Sterile 2x75 (in/in) Discharge Instruction:  Secure with stretch gauze as directed. Paper Tape, 1x10 (in/yd) Discharge Instruction: Secure dressing with tape as directed. Compression Wrap Compression Stockings Add-Ons Electronic Signature(s) Signed: 09/19/2020 6:32:01 PM By: Baruch Gouty RN, BSN Signed: 09/22/2020 8:38:46 AM By: Leane Call Previous Signature: 09/16/2020 5:33:29 PM Version By: Baruch Gouty RN, BSN Entered By: Leane Call on 09/19/2020 15:58:30 -------------------------------------------------------------------------------- Wound Assessment Details Patient Name: Date of Service: [name] 09/16/2020 12:45 PM Medical Record Number: [id number] Patient Account Number: [id number] Date of Birth/Sex: Treating RN: 05-28-1970 (50 y.o. [name] Primary Care Aowyn Rozeboom: [name], [name] Other Clinician: Referring Binh Doten: Treating Levis Nazir/Extender: [name], [name] Weeks in Treatment: 106 Wound Status Wound Number: 52 Primary Diabetic Wound/Ulcer of the Lower Extremity Etiology: Wound Location: Right Foot Wound Open Wounding Event: Gradually Appeared Status: Date Acquired: 09/02/2020 Comorbid Cataracts, Chronic sinus problems/congestion, Anemia, Sleep Weeks Of Treatment: 2 History: Apnea, Deep Vein Thrombosis, Hypertension, Peripheral Arterial Clustered Wound: Yes Disease, Type I Diabetes, Osteoarthritis, Osteomyelitis, Neuropathy, Seizure  Disorder Photos Wound Measurements Length: (cm) 1.5 Width: (cm) 1.5 Depth: (cm) 0.1 Clustered Quantity: 3 Area: (cm) 1.767 Volume: (cm) 0.177 % Reduction in Area: 43.2% % Reduction in Volume: 71.5% Epithelialization: Small (1-33%) Tunneling: No Undermining: No Wound Description Classification: Grade 2 Wound Margin: Distinct, outline attached Exudate Amount: Medium Exudate Type: Serous Exudate Color: amber Foul Odor After Cleansing: No Slough/Fibrino Yes Wound Bed Granulation Amount: Small (1-33%) Exposed  Structure Granulation Quality: Pink Fascia Exposed: No Necrotic Amount: Large (67-100%) Fat Layer (Subcutaneous Tissue) Exposed: Yes Necrotic Quality: Adherent Slough Tendon Exposed: No Muscle Exposed: No Joint Exposed: No Bone Exposed: No Treatment Notes Wound #52 (Foot) Wound Laterality: Right Cleanser Soap and Water Discharge Instruction: May shower and wash wound with dial antibacterial soap and water prior to dressing change. Peri-Wound Care Topical Primary Dressing Promogran Prisma Matrix, 4.34 (sq in) (silver collagen) Discharge Instruction: Moisten collagen with saline or hydrogel Secondary Dressing Woven Gauze Sponges 2x2 in Discharge Instruction: Apply over primary dressing as directed. Secured With Elastic Bandage 4 inch (ACE bandage) Discharge Instruction: Secure with ACE bandage as directed. Conforming Stretch Gauze Bandage, Sterile 2x75 (in/in) Discharge Instruction: Secure with stretch gauze as directed. Paper Tape, 1x10 (in/yd) Discharge Instruction: Secure dressing with tape as directed. Compression Wrap Compression Stockings Add-Ons Electronic Signature(s) Signed: 09/19/2020 6:32:01 PM By: Baruch Gouty RN, BSN Signed: 09/22/2020 8:38:46 AM By: Leane Call Previous Signature: 09/16/2020 5:33:29 PM Version By: Baruch Gouty RN, BSN Entered By: Leane Call on 09/19/2020 15:57:52 -------------------------------------------------------------------------------- Vitals Details Patient Name: Date of Service: [name] 09/16/2020 12:45 PM Medical Record Number: [id number] Patient Account Number: [id number] Date of Birth/Sex: Treating RN: Jul 03, 1970 (50 y.o. [name] Primary Care Rockne Dearinger: [name], [name] Other Clinician: Referring Meili Kleckley: Treating Jocilyn Trego/Extender: [name], [name] Weeks in Treatment: 106 Vital Signs Time Taken: 13:06 Temperature (F): 99.1 Height (in): 67 Pulse (bpm): 80 Source:  Stated Respiratory Rate (breaths/min): 18 Weight (lbs): 125 Blood Pressure (mmHg): 146/70 Source: Stated Capillary Blood Glucose (mg/dl): 124 Body Mass Index (BMI): 19.6 Reference Range: 80 - 120 mg / dl Notes glucose per pt report at present Electronic Signature(s) Signed: 09/16/2020 5:33:29 PM By: Baruch Gouty RN, BSN Entered By: Baruch Gouty on 09/16/2020 13:08:08

## 2020-09-16 NOTE — Progress Notes (Signed)
[name], [name] ([id number]) Visit Report for 09/16/2020 Debridement Details Patient Name: Date of Service: Nancy LLO [name] 09/16/2020 12:45 PM Medical Record Number: [id number] Patient Account Number: [id number] Date of Birth/Sex: Treating RN: 02-12-1971 (50 y.o. [name] Primary Care Provider: [name], [name] Other Clinician: Referring Provider: Treating Provider/Extender: [name], [name] Weeks in Treatment: 106 Debridement Performed for Assessment: Wound #[id number] [place] Performed By: Physician Ricard Dillon., MD Debridement Type: Debridement Severity of Tissue Pre Debridement: Fat layer exposed Level of Consciousness (Pre-procedure): Awake and Alert Pre-procedure Verification/Time Out Yes - 13:29 Taken: Start Time: 13:30 T Area Debrided (L x W): otal 0.3 (cm) x 0.3 (cm) = 0.09 (cm) Tissue and other material debrided: Non-Viable, Subcutaneous, Skin: Dermis Level: Skin/Subcutaneous Tissue Debridement Description: Excisional Instrument: Curette Bleeding: Moderate Hemostasis Achieved: Pressure End Time: 13:37 Response to Treatment: Procedure was tolerated well Level of Consciousness (Post- Awake and Alert procedure): Post Debridement Measurements of Total Wound Length: (cm) 1.5 Width: (cm) 1.5 Depth: (cm) 0.3 Volume: (cm) 0.53 Character of Wound/Ulcer Post Debridement: Stable Severity of Tissue Post Debridement: Fat layer exposed Post Procedure Diagnosis Same as Pre-procedure Electronic Signature(s) Signed: 09/16/2020 5:27:23 PM By: Linton Ham MD Signed: 09/16/2020 5:38:00 PM By: Lorrin Jackson Entered By: Linton Ham on 09/16/2020 13:42:37 -------------------------------------------------------------------------------- HPI Details Patient Name: Date of Service: Nancy LLO [name] 09/16/2020 12:45 PM Medical Record Number: [id number] Patient Account Number: [id number] Date of Birth/Sex: Treating  RN: [birth year] (50 y.o. [name] Primary Care Provider: [name], [name] Other Clinician: Referring Provider: Treating Provider/Extender: [name], [name] Weeks in Treatment: 106 History of Present Illness HPI Description: When64 year old diabetic who is known to have type 1 diabetes which is poorly controlled last hemoglobin A1c was 11%. She comes in with a ulcerated area on the left lateral foot which has been there for over 6 months. Was recently she has been treated by [name] of podiatry who saw her last on 05/28/2016. Review of his notes revealed that the patient had incision and drainage with placement of antibiotic beads to the left foot on 04/11/2016 for possible osteomyelitis of the cuboid bone. Over the last year she's had a history of amputation of the left fifth toe and a femoropopliteal popliteal bypass graft somewhere in April 2017. 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 His note [name] mentions that the patient has been referred to me for further wound care and possibly great candidate for hyperbaric oxygen therapy due to recurrent osteomyelitis. However we do not have any x-rays of biopsy reports confirming this. He has been on several antibiotics including Bactrim and most recently is on doxycycline for an MRSA. I understand, the patient was not a candidate for IV antibiotics as she has had previous PICC lines which resulted in blood clots in both arms. There was a x-ray report dated 04/04/2016 on [name] notes which showed evidence of fifth ray resection left foot with osteolytic changes noted to the fourth metatarsal and cuboid bone on the left. 06/13/2016 -- had a left foot x-ray which showed no acute fracture or dislocation and no definite radiographic evidence of osteomyelitis. Advanced osteopenia was seen. 06/20/2016 -- she has noticed a new wound on the right plantar foot in the region where she had a callus  before. 06/27/16- the patient did have her x-ray of the right foot which showed no findings to suggest osteomyelitis. She saw  her endocrinologist, [name], yesterday. Her A1c in January was 11. He also indicates mismanagement and noncompliance regarding her diabetes. She is currently on Bactrim for a lip infection. She is complaining of nausea, vomiting and diarrhea. She is unable to articulate the exact orders or dosing of the Bactrim; it is unclear when she will complete this. 07/04/2016 -- results from [facility] of ABIs with ankle waveforms were noted from 02/14/2016. The examination done on 06/27/2015 showed noncompressible ABIs with the right being 1.45 and the left being 1.33. The present examination showed a right ABI of 1.19 on the left of 1.33. The conclusion was that right normal ABI in the lower extremity at rest however compared to previous study which was noncompressible ABI may be falsely elevated side suggesting medial calcification. The left ABI suggested medial calcification. 08/01/2016 -- the patient had more redness and pain on her right foot and did not get to come to see as noted she see her PCP or go to the ER and decided to take some leftover metronidazole which she had at home. As usual, the patient does report she feels and is rather noncompliant. 08/08/2016 -- -- x-ray of the right foot -- FINDINGS:Transmetatarsal amputation is noted. No bony destruction is noted to suggest osteomyelitis. IMPRESSION: No evidence of osteomyelitis. Postsurgical changes are seen. MRI would be more sensitive for possible bony changes. Culture has grown Serratia Marcescens -- sensitive to Bactrim, ciprofloxacin, ceftazidime she was seen by [name] on 08/06/2016. He did not find any exposed bone, muscle, tendon, ligament or joint. There was no malodor and he did a excisional debridement in the office. ============ Old notes: 50 year old patient who is known to the wound clinic for a  while had been away from the wound clinic since 09/01/2014. Over the last several months she has been admitted to various hospitals including [facility]. She was treated for a right metatarsal osteomyelitis with a transmetatarsal amputation and this was done about 2 months ago. He has a small ulcerated area on the right heel and she continues to have an ulcerated area on the left plantar aspect of the foot. The patient was recently admitted to the [facility] hospital group between 7/12 and 10/18/2014. she was given 3 weeks of IV vancomycin and was to follow-up with her surgeons at [facility] and also took oral vancomycin for C. difficile colitis. 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 MRI of the left foot done on 10/14/2014 showed no abscess or osteomyelitis. 04/27/15; this is a patient we know from previous stays in the wound care center. She is a type I diabetic I am not sure of her control currently. Since the last time I saw her she is had a right transmetatarsal amputation and has no wounds on her right foot and has no open wounds. She is been followed at the wound care center at [facility] in [place]. She comes today with the desire to undergo hyperbaric treatment locally. Apparently one of her wound care providers in [place] has suggested hyperbarics. This is in response to an MRI from 04/18/15 that showed increased marrow signal and loss of the proximal fifth metatarsal cortex evidence of osteomyelitis with likely early osteomyelitis in the cuboid bone as well. She has a large wound over the  base of the fifth metatarsal. She also has a eschar over her the tips of her toes on 1,3 and 5. She does  not have peripheral pulses and apparently is going for an angiogram tomorrow which seems reasonable. After this she is going to infectious disease at [facility]. They have been using Medihoney to the large wound on the lateral aspect of the left foot to. The patient has known Charcot deformity from diabetic neuropathy. She also has known diabetic PAD. Surprisingly I can't see that she has had any recent antibiotics, the patient states the last antibiotic she had was at the end of November for 10 days. I think this was in response to culture that showed group G strep although I'm not exactly sure where the culture was from. She is also had arterial studies on 03/29/15. This showed a right ABI of 1.4 that was noncompressible. Her left ABI was 0.73. 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 It was not felt that arterial inflow was adequate for healing of a foot ulcer. Her Doppler waveforms looked monophasic ===== READMISSION 02/28/17; this is in an now 50 year old woman we've had at several different occasions in this clinic. 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 She has a remote ex-smoker. She was last seen in this clinic by Dr. Con Memos I think in May. More recently she is been followed by her podiatrist [name] an infectious disease Dr. Megan Salon. She has 2 open wounds the major one is over the right first metatarsal head she also has a wound on the left plantar foot. an MRI of the right foot on 01/01/17 showed a soft tissue ulcer along the plantar aspect of the first metatarsal base consistent with osteomyelitis of the first metatarsal stump. Dr. 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 According to the patient this is been open for slightly over a month. She has been on a combination of Cipro 500 twice a day, Zyvox 600 twice a day and Flagyl 500 3 times a  day for over a month now as directed by Dr. Megan Salon. cultures of the right foot earlier this year showed MRSA in January and Serratia in May. January also had a few viridans strep. Recent x-rays of both feet were done and [name] office and I don't have these reports. The patient has known PAD and has a history of aleft femoropopliteal bypass in April 2017. 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 Dwyane Dee. In spite of this the last hemoglobin A1c I can see is 10.1 on 01/01/2017. She is being referred by 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 She is been using Medihoney to this area and also an area on the left midfoot. She is using healing sandals bilaterally. ABIs in this clinic at the left posterior tibial was 1.1 noncompressible on the right READMISSION Non invasive vascular NOVANT 5/18 Aftercare following surgery of the circulatory system Procedure Note - Interface, External Ris In - 08/13/2016 11:05 AM EDT Procedure: Examination consists of physiologic resting arterial pressures of the brachial and ankle arteries bilaterally with continuous wave Doppler waveform analysis. Previous: Previous exam performed on 02/14/16 demonstrated ABIs of Rt =  1.19 and Lt = 1.33. Right: ABI = non-compressible PT 1.47 DP. S/P transmet amputation. , Left: ABI = 1.52, 2nd digit pressure = 87 mmHg Conclusions: Right: ABI (>1.3) may be falsely elevated, suggesting medial calcification. Left: ABI (>1.3) may be falsely elevated, suggesting medial calcification The patient is a now 50 year old type I diabetic is had multiple issues her graded to chronic diabetic foot ulcers. She has had a previous right transmetatarsal amputation fifth ray amputation. She had  Charcot feet diabetic polyneuropathy. We had her in the clinic lastin November. At that point she had wounds on her bilateral feet.she had wanted to try hyperbarics however the healogics review process denied her because she hadn't followed up with her vascular surgeon for her left femoropopliteal bypass. The bypass was done by Dr. Raul Del at [facility]. We made her a follow-up with Dr. Raul Del however she did not keep the appointment and therefore she was not approved The patient shows me a small wound on her left fourth metatarsal head on her phone. She developed rapid discoloration in the plantar aspect of the left foot and she was admitted to hospital from 2/2 through 05/10/17 with wet gangrene of the left foot osteomyelitis of the fourth metatarsal heads. She was admitted acutely ill with a temperature of 103. She was started on broad-spectrum vancomycin and cefepime. On 05/06/17 she was taken to the OR by [name] her podiatric surgeon for an incision and drainage irrigation of the left foot wound. Cultures from this surgery revealed group be strep and anaerobes. she was seen by Dr.Xu of orthopedic surgery and scheduled for a below-knee amputation which she u refused. Ultimately she was discharged on Levaquin and Flagyl for one month. MRI 05/05/17 done while she was in the hospital showed abscess adjacent to the fourth metatarsal head and neck small abscess around the fourth flexor tendon. Inflammatory phlegmon and gas in the soft tissues along the lateral aspect of the fourth phalanx. Findings worrisome for osteomyelitis involving the fourth proximal and middle phalanx and also the third and fourth metatarsals. Finally the patient had actually shortly before this followed up with Dr. Raul Del at no time on 04/29/17. He felt that her left femoropopliteal bypass was patent he felt that her left-sided toe pressures more than adequate for healing a wound on the left foot. This was before her acute  presentation. 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 05/28/17; she is started hyperbarics. The patient tells me that for some reason she was not actually on Levaquin but I think on ciprofloxacin. She was on Flagyl. She only started her Levaquin yesterday due to some difficulty with the pharmacy and perhaps her sister picking it up. She has an appointment with [name] tomorrow and with infectious disease early next week. She has no new complaints 06/06/17; the patient continues in hyperbarics. She saw [name] on 05/29/17 who is her podiatric surgeon. He is elected for a transmetatarsal amputation on 06/27/17. I'm not sure at what level he plans to do this amputation. The patient is unaware She also saw Dr. Megan Salon of infectious disease who elected to continue her on current antibiotics I think this is ciprofloxacin and Flagyl. I'll need to clarify with her tomorrow if she actually has this. We're using silver alginate to the actual wound. Necrotic surface today with material under the flap of her foot. Original MRI showed abscesses as well as osteomyelitis of the proximal and middle fourth phalanx and the third and fourth metatarsal heads 06/11/17; patient continues in  hyperbarics and continues on oral antibiotics. She is doing well. The wound looks better. The necrotic part of this under the flap in her superior foot also looks better. she is been to see [name]. I haven't had a chance to look at his note. Apparently he has put the transmetatarsal amputation on hold her request it is still planning to take her to the OR for debridement and product application ACEL. I'll see if I can find his note. I'll therefore leave product ordering/requests to [name] for now. I was going to look at Dermagraft 06/18/17-she is here in follow-up evaluation for bilateral foot wounds. She continues with hyperbaric therapy. She states she has been applying manuka honey to the right plantar foot and alternate  manuka honey and silver alginate to the left foot, despite our orders. We will continue with same treatment plan and she will follo up next week. 06/25/17; I have reviewed [name] last note from 3/11. She has operative debridement in 2 days' time. By review his note apparently they're going to place there is skin over the majority of this wound which is a good choice. She has a small satellite area at the most proximal part of this wound on the left plantar foot. The area on the right plantar foot we've been using silver alginate and it is close to healing. 07/02/17; unfortunately the patient was not easily approved for [name] proposed surgery. I'm not completely certain what the issue is. She has been using silver alginate to the wound she has completed a first course of hyperbarics. She is still on Levaquin and Flagyl. I have really lost track of the time course here.I suspect she should have another week to 2 of antibiotics. I'll need to see if she is followed up with infectious disease Dr. Megan Salon 07/09/17; the patient is followed up with Dr. Megan Salon. 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 She continues on Levaquin and metronidazole continuing both of these for now I think she is been on fr about 6 weeks. She still has some drainage but no pain. No fever. Her had been plans for her to go to the OR for operative debridement with her podiatrist [name], I am not exactly sure where that is. I'll probably slip a note to [name] today. I note that she follows with Dr. Dwyane Dee of endocrinology. We have her recertified for hyperbaric oxygen. I have not heard about Dermagraft however I'll see if [name] is planning a skin substitute as well 07/16/17; the patient tells me she is just about out of [place]. I'll need to check Dr. Hale Bogus last notes on this. She states she has plenty of Flagyl however. She comes in today complaining of pain in the right  lateral foot which she said lasted for about a day. The wound on the right foot is actually much more medially. She also tells me that the [facility] cost a lot of pain in the left foot wound and she turned back to silver alginate. Finally Dermagraft has a $509 per application co-pay. She cannot afford this 07/23/17; patient arrives today with the wound not much smaller. There is not much new to add. She has not heard from [name] all try to put in a call to them today. She was asking about Dermagraft again and she has an over $326 per application co-pay she states that she would be willing to try to do  a payment plan. I been tried to avoid this. We've been using silver alginate, I'll change to [facility] 07/30/17-She is here in follow-up evaluation for left foot ulcer. She continues hyperbaric medicine. The left foot ulcer is stable we will continue with same treatment plan 08/06/17; she is here for evaluation of her left foot ulcer. Currently being treated for hyperbarics or underlying osteomyelitis. She is completed antibiotics. The left foot ulcer is better smaller with healthier looking granulation. For various reasons I am not really clear on we never got her back to the OR with [name]. He did not respond to my secure text message. Nevertheless I think that surgery on this point is not necessary nor am I completely clear that a skin substitute is necessary The patient is complaining about pain on the outside of her right foot. 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 There is no erythema. She also states the foot is warm versus her other part of her upper leg and this is largely true. It is not totally clear to me what's causing this. She thinks it's different from her usual neuropathy pain 08/13/17; she arrives in clinic today with a small wound which is superficial on her right first metatarsal head. 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 She tells  [place] she was up on her feet over the Mother's Day celebration. The large wound is on the left foot. Continues with hyperbarics for underlying osteomyelitis. We're using Hydrofera Blue. She asked me today about where we were with Dermagraft. I had actually excluded this because of the co-pay however she wants to assume this therefore I'll recheck the co-pay an order for next week. 08/20/17; the patient agreed to accept the co-pay of the first Apligraf which we applied today. She is disappointed she is finishing hyperbarics will run this through the insurance on the extent of the foot infection and the extent of the wound that she had however she is already had 60 dive's. Dermagraft No. 1 08/27/17; Dermagraft No. 2. She is not eligible for any more hyperbaric treatments this month. She reports a fair amount of drainage and she actually changed to the external dressings without disturbing the direct contact layer 09/03/17; the patient arrived in clinic today with the wound superficially looking quite healthy. Nice vibrant red tissue with some advancing epithelialization although not as much adherence of the flap as I might like. However she noted on her own fourth toe some bogginess and she brought that to our attention. Indeed this was boggy feeling like a possibility of subcutaneous fluid. 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 She is not been unwell. We've been using Dermagraft 09/10/17; the culture that I did not last week was MRSA. She saw Dr. Megan Salon this morning who is going to start her on vancomycin. I had sent him a secure a text message yesterday. I also spoke with her podiatric surgeon [name] about surgery on this foot the options for conserving a functional foot etc. Promised me he would see her and will make back consultation today. Paradoxically her actual wound on the plantar aspect of her left foot looks really  quite good. I had given her 5 days worth of Baxdella to cover her for MRSA. Her MRI came back showing osteomyelitis within the third metatarsal shaft and head and base of the third and fourth proximal phalanx. She had extensive  inflammatory changes throughout the soft tissue of the lateral forefoot. With an ill-defined fluid around the fourth metatarsal extending into the plantar and dorsal soft tissues 09/19/17; the patient is actually on oral Septra and Flagyl. She apparently refused IV vancomycin. She also saw [name] at my request who is planning her for a left BKA sometime in mid July. MRI showed osteomyelitis within the third metatarsal shaft and head and the basis of the third and fourth proximal phalanx. I believe there was felt to be possible septic arthritis involving the third MTP. 09/26/17; the patient went back to Dr. Megan Salon at my suggestion and is now receiving IV daptomycin. Her wound continues to look quite good making the decision to proceed with a transmetatarsal amputation although more difficult for the patient. I believe in my extensive discussions with her she has a good sense of the pros and cons of this. I don't NV the tuft decision she has to make. She has an appointment with [name] I believe in mid July and I previously spoken to him about this issue Has we had used 3 previous Dermagraft. Given the condition of the wound surface I went ahead and added the fourth one today area and I did this not fully realizing that she'll be traveling to [place] next week. I'm hopeful she can come back in 2 weeks 10/21/17; Her same Dermagraft on for about 3-1/2 weeks. In spite of this the wound arrives looking quite healthy. There is been a lot of healing dimensions are smaller. Looking at the square shaped wound she has now there is some undermining and some depth medially under the undermining although I cannot palpate any bone. No surrounding infection is obvious. She has difficult  questions about how to look at this going forward vis--vis amputations versus continued medical therapy. T be truthful the wound is looks o so healthy and it is continued to contract. Hard to justify foot surgery at this point although I still told her that I think it might come to that if we are not able to eradicate the underlying MRSA. She is still highly at risk and she understands this 11/06/17 on evaluation today patient appears to be doing better in regard to her foot ulcer. She's been tolerating the dressing changes without complication. Currently she is here for her Dermagraft #6. Her wound continues to make excellent progress at this point. She does not appear to have any evidence of infection which is good news. 11/13/17 on evaluation today patient appears to be doing excellent at this time. She is here for repeat Dermagraft application. This is #7. Overall her wound seems to be making great progress. 12/05/17; the patient arrives with the wound in much better condition than when I last saw this almost 6 weeks ago. She still has a small probing area in the left metatarsal head region on the lateral aspect of her foot. We applied her last Dermagraft today. Since the last time she is here she has what appears to a been a blood blister on the plantar aspect of left foot although I don't see this is threatening. There is also a thick raised tissue on the right mid metatarsal head region. This was not there I don't think the last time she was here 3 weeks ago. 12/12/17; the patient continues to have a small programming area in the left metatarsal head region on the lateral aspect of her foot which was the initial large surgical wound. I applied her last Apligraf  last week. I'm going to use Endoform starting today Unfortunately she has an excoriated area in the left mid foot and the right mid foot. The left midfoot looks like a blistered area this was not opened last week it certainly is open today.  Using silver alginate on these areas. She promises me she is offloading this. 12/19/17; the small probing area in the left metatarsal head eyes think is shallower. In general her original wound looks better. We've been using Endoform. The area inferiorly that I think was trauma last week still requires debridement a lot of nonviable surface which I removed. She still has an open open area distally in her foot Similarly on the right foot there is tightly adherent surface debris which I removed. Still areas that don't look completely epithelialized. This is a small open area. We used silver alginate on these areas 12/26/2017; the patient did not have the supplies we ordered from last week including the Endoform. The original large wound on the left lateral foot looks healthy. She still has the undermining area that is largely unchanged from last week. She has the same heavily callused raised edged wounds on the right mid and left midfoot. Both of these requiring debridement. We have been using silver alginate on these areas 01/02/2018; there is still supply issues. We are going to try to use Prisma but I am not sure she actually got it from what she is saying. She has a new open area on the lateral aspect of the left fourth toe [previous fifth ray amputation]. Still the one tunneling area over the fourth metatarsal head. The area is in the midfoot bilaterally still have thick callus around them. She is concerned about a raised swelling on the lateral aspect of the foot. However she is completely insensate 01/10/2018; we are using Prisma to the wounds on her bilateral feet. Surprisingly the tunneling area over the left fourth metatarsal head that was part of her original surgery has closed down. She has a small open area remaining on the incision line. 2 open areas in the midfoot. 02/10/2018; the patient arrives back in clinic after a month hiatus. She was traveling to visit family in [place]. Is fairly  clear she was not offloading the areas on her feet. The original wound over the left lateral foot at the level of metatarsal heads is reopened and probes medially by about a centimeter or 2. She notes that a week ago she had purulent drainage come out of an area on the left midfoot. Paradoxically the worst area is actually on the right foot is extensive with purulent drainage. We will use silver alginate today 02/17/2018; the patient has 3 wounds one over the left lateral foot. She still has a small area over the metatarsal heads which is the remnant of her original surgical wound. This has medial probing depth of roughly 1.4 cm somewhat better than last week. The area on the right foot is larger. We have been using silver alginate to all areas. The area on the right foot and left foot that we cultured last week showed both Klebsiella and Proteus. Both of these are quinolone sensitive. The patient put her's self on Bactrim and Flagyl that she had left hanging around from prior antibiotic usages. She was apparently on this last week when she arrived. I did not realize this. Unfortunately the Bactrim will not cover either 1 of these organisms. We will send in Cipro 500 twice daily for a week 03/04/2018; the patient  has 2 wounds on the left foot one is the original wound which was a surgical wound for a deep DFU. At one point this had exposed bone. She still has an area over the fourth metatarsal head that probes about 1.4 cm although I think this is better than last week. I been using silver nitrate to try and promote tissue adherence and been using silver alginate here. She also has an area in the left midfoot. This has some depth but a small linear wound. Still requiring debridement. On the right midfoot is a circular wound. A lot of thick callus around this area. We have been using silver alginate to all wound areas She is completed the ciprofloxacin I gave her 2 weeks ago. 03/11/2018; the patient  continues to have 2 open areas on the left foot 1 of which was the original surgical wound for a deep DFU. Only a small probing area remains although this is not much different from last week we have been using silver alginate. The other area is on the midfoot this is smaller linear but still with some depth. We have been using silver alginate here as well On the right foot she has a small circular wound in the mid aspect. This is not much smaller than last time. We have been using silver alginate here as well 03/18/2018; she has 3 wounds on the left foot the original surgical wound, a very superficial wound in the mid aspect and then finally the area in the mid plantar foot. She arrives in today with a very concerning area in the wound in the mid plantar foot which is her most proximal wound. There is undermining here of roughly 1-1/2 cm superiorly. Serosanguineous drainage. She tells me she had some pain on for over the weekend that shot up her foot into her thigh and she tells me that she had a nodule in the groin area. She has the single wound in the right foot. We are using endoform to both wound areas 03/24/2018; the patient arrives with the original surgical wound in the area on the left midfoot about the same as last week. There is a collection of fluid under the surface of the skin extending from the surgical wound towards the midfoot although it does not reach the midfoot wound. The area on the right foot is about the same. Cultures from last week of the left midfoot wound showed abundant Klebsiella abundant Enterococcus faecalis and moderate methicillin resistant staph I gave her Levaquin but this would have only covered the Klebsiella. She will need linezolid 04/01/2018; she is taking linezolid but for the first few days only took 1 a day. I have advised her to finish this at twice daily dosing. In any case all of her wounds are a lot better especially on the left foot. The original  surgical wound is closed. The area on the left midfoot considerably smaller. The area on the right foot also smaller. 04/08/2018; her original surgical wound/osteomyelitis on the left foot remains closed. She has area on the left foot that is in the midfoot area but she had some streaking towards this. This is not connected with her original wound at least not visually. Small wound on the right midfoot appears somewhat smaller. 04/15/18; both wounds looks better. Original wound is better left midfoot. Using silver alginate 1/21; patient states she uses saltwater soak in, stones or remove callus from around her wounds. She is also concerned about a blood blister she had on  the left foot but it simply resolved on its own. We've been using silver alginate 1/28; the patient arrives today with the same streaking area from her metatarsals laterally [the site of her original surgical wound] down to the middle of her foot. There is some drainage in the subcutaneous area here. This concerns me that there is actually continued ongoing infection in the metatarsals probably the fourth and third. This fixates an MRI of the foot without contrast [chronic renal failure] The wound in the mid part of the foot is small but I wonder whether this area actually connects with the more distal foot. The area on the right midfoot is probably about the same. Callus thick skin around the small wound which I removed with a curette we have been using silver alginate on both wound areas 2/4; culture I did of the draining site on the left foot last time grew methicillin sensitive staph aureus. MRI of the left foot showed interval resolution of the findings surrounding the third metatarsal joint on the prior study consistent with treated osteomyelitis. Chronic soft tissue ulceration in the plantar and lateral aspect of the forefoot without residual focal fluid collection. No evidence of recurrent osteomyelitis. 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 She tells me she had nausea and vomiting over the weekend and missed 2 days. In general her foot looks much the same. She has a small open area just below the left fourth metatarsal head. A linear area in the left midfoot. Some discoloration extending from the inferior part of this into the left lateral foot although this appears to be superficial. She has a small area on the right midfoot which generally looks smaller after debridement 2/18; the patient is completing his cephalexin and has another 2 days. She continues to have open areas on the left and right foot. 2/25; she is now off antibiotics. The area on the left foot at the site of her original surgical wound has closed yet again. She still has open areas in the mid part of her foot however these appear smaller. The area on the right mid foot looks about the same. We have been using silver alginate She tells me she had a serious hypoglycemic spell at home. She had to have EMS called and get IV dextrose 3/3; disappointing on the left lateral foot large area of necrotic tissue surrounding the linear area. This appears to track up towards the same original surgical wound. Required extensive debridement. The area on the right plantar foot is not a lot better also using silver 3/12; the culture I did last time showed abundant enterococcus. I have prescribed Augmentin, should cover any unrecognized anaerobes as well. In addition there were a few MRSA and Serratia that would not be well covered although I did not want to give her multiple antibiotics. She comes in today with a new wound in the right midfoot this is not connected with the original wound over her MTP a lot of thick callus  tissue around both wounds but once again she said she is not walking on these areas 3/17-Patient comes in for follow-up on the bilateral plantar wounds, the right midfoot and the left plantar wound. Both these are heavily callused surrounding the wounds. We are  continuing to use silver alginate, she is compliant with offloading and states she uses a wheelchair fairly often at home 3/24; both wound areas have thick callus. However things actually look quite a bit better here for the majority of her left foot and the right foot. 3/31; patient continues to have thick callused somewhat irritated looking tissue around the wounds which individually are fairly superficial. There is no evidence of surrounding infection. We have been using silver alginate however I change that to [facility] today 4/17; patient returns to clinic after having a scare with Covid she tested negative in her primary doctor's office. She has been using Hydrofera Blue. She does not have an open area on the right foot. On the left foot she has a small open area with the mid area not completely viable. She showed me pictures of what looks like a hemorrhagic blister from several days ago but that seems to have healed over this was on the lateral left foot 4/21; patient comes in to clinic with both her wounds on her feet closed. However over the weekend she started having pain in her right foot and leg up into the thigh. She felt as though she was running a low-grade fever but did not take her temperature. She took a doxycycline that she had leftover and yesterday a single Septra and metronidazole. She thinks things feel somewhat better. 4/28; duplex ultrasound I ordered last week was negative for DVT or superficial thrombophlebitis. She is completed the doxycycline I gave her. States she is still having a lot of pain in the right calf and right ankle which is no better than last week. She cannot sleep. She also states she has a  temperature of up to 101, coughing and complaining of visual loss in her bilateral eyes. Apparently she was tested for Covid 2 weeks ago at [facility] and that was negative. Readmission: 09/03/18 patient presents back for reevaluation after having been evaluated at the end of April regarding erythema and swelling of her right lower extremity. Subsequently she ended up going to the hospital on 07/29/18 and was admitted not to be discharged until 08/08/18. Unfortunately it was noted during the time that she was in the hospital that she did have methicillin-resistant Staphylococcus aureus as the infection noted at the site. It was also determined that she did have osteomyelitis which appears to be fairly significant. She was treated with vancomycin and in fact is still on IV vancomycin at dialysis currently. This is actually slated to continue until 09/12/18 at least which will be the completion of the six weeks of therapy. Nonetheless based on what I'm seeing at this point I'm not sure she will be anywhere near ready to discontinue antibiotics at that time. Since she was released from the hospital she was seen by [name] who is her podiatrist on 08/27/18. His note specifically states that he is recommended that the patient needs of one knee amputation on the right as she has a life- threatening situation that can lead quickly to sepsis. The patient advised she would like to try to save her leg to which [name] apparently told her that this was against all medical advice. She also want to discontinue the Wound VAC which had been initiated due to the fact that she wasn't pleased with how the wound was looking and subsequently she wanted to pursue applying Medihoney at that time. He stated that he did not believe that the right lower extremity was salvageable and that  the patient understood but would still like to attempt hyperbaric option therapy if it could be of any benefit. She was therefore referred back to  [place] for further evaluation. He plans to see her back next week. Upon inspection today patient has a significant amount purulent drainage noted from the wound at this point. The bone in the distal portion of her foot also appears to be extremely necrotic and spongy. When I push down on the bone it bubbles and seeps purulent drainage from deeper in the end of the foot. I do not think that this is likely going to heal very well at all and less aggressive surgical debridement were undertaken more than what I believe we can likely do here in our office. 09/12/2018; I have not seen this patient since the most recent hospitalization although she was in our clinic last week. I have reviewed some of her records from a complex hospitalization. 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 There is situation was complicated by MRSA bacteremia and acute on chronic renal failure now on dialysis. She is receiving vancomycin at dialysis. We started her on Dakin's wet-to-dry last week she is changing this daily. There is still purulent drainage coming out of her foot. Although she is apparently "agreeable" to a below-knee amputation which is been suggested by multiple clinicians she wants this to be done in [place]. She apparently has a telehealth visit with that provider sometime in late [place] 6/24. I have told her I think this is probably too long. Nevertheless I could not convince her to allow a local doctor to perform BKA. 09/19/2018; the patient has a large necrotic area on the right anterior foot. She has had previous transmetatarsal amputations. Culture I did last week showed MRSA nothing else she is on vancomycin at dialysis. She has continued leaking purulent drainage out of the distal part of the large circular wound on the right anterior foot. She apparently went to see Dr. 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 Somehow that  translated into her being referred to plastic surgery for debridement of the area. I gather she basically refused amputation although I do not have a copy of Dr. Berenice Primas notes. The patient really wants to have a trial of hyperbaric oxygen. I agreed with initial assessment in this clinic that this was probably too far along to benefit however if she is going to have plastic surgery I think she would benefit from ancillary hyperbaric oxygen. The issue here is that the patient has benefited as maximally as any patient I have ever seen from hyperbaric oxygen therapy. Most recently she had exposed bone on the lateral part of her left foot after a surgical procedure and that actually has closed. She has eschared areas in both heels but no open area. She is remained systemically well. I am not optimistic that anything can be done about this but the patient is very clear that she wants an attempt. The attempt would include a wound VAC further debridements and hyperbaric oxygen along with IV antibiotics. 6/26; I put her in for a trial of hyperbaric oxygen only because of the dramatic response she has had with wounds on her left midfoot earlier this year which was a surgical wound that went straight to her bone over the metatarsal heads and also remotely the left third toe. We will see if we can get this through our review process and insurance. She arrives in  clinic with again purulent material pouring out of necrotic bone on the top of the foot distally. There is also some concerning erythema on the front of the leg that we marked. It is bit difficult to tell how tender this is because of neuropathy. I note from infectious disease that she had her vancomycin extended. 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 She had the wound VAC on for part of the week. The rest of the time she is putting various things on this including Medihoney, "ionized water" silver sorb gel etc. 7/7;  follow-up along with HBO. She is still on vancomycin at dialysis. She has a large open area on the dorsal right foot and a small dark eschar area on her heel. There is a lot less erythema in the area and a lot less tenderness. From an infection point of view I think this is better. She still has a lot of necrosis in the remaining right forefoot [previous TMA] we are still using the wound VAC in this area 7/16; follow-up along with HBO. I put her on linezolid after she finished her vancomycin. We started this last Friday I gave her 2 weeks worth. I had the expectation that she would be operatively debrided by Dr. Marla Roe but that still has not happened yet. Patient phoned the office this week. She arrives for review today after HBO. The distal part of this wound is completely necrotic. Nonviable pieces of tendon bone was still purulent drainage. Also concerning that she has black eschar over the heel that is expanding. I think this may be indicative of infection in this area as well. She has less erythema and warmth in the ankle and calf but still an abnormal exam 7/21 follow-up along with HBO. I will renew her linezolid after checking a CBC with differential monitoring her blood counts especially her platelets. She was supposed to have surgery yesterday but if I am reading things correctly this was canceled after her blood sugar was found to be over 500. I thought Dr. Marla Roe who called me said that they were sending her to the ER but the patient states that was not the case. 7/28. Follow-up along with HBO. She is on linezolid I still do not have any lab work from dialysis even though I called last week. The patient is concerned about an area on her left lateral foot about the level of the base of her fifth metatarsal. I did not really see anything that ominous here however this patient is in [place] ability to point out problems that she is sensing and she has been accurate in the past Finally  she received a call from Dr. Marla Roe who is referring her to another orthopedic surgeon stating that she is too booked up to take her to the operating room now. Was still using a wound VAC on the foot 8/3 -Follow-up after HBO, she is got another week of linezolid, she is to call [facility] for an appointment, x-rays of both feet were reviewed, the left foot x-ray with third MTP joint osteo- Right foot x-ray widespread osteo-in the right midfoot Right ankle x-ray does not show any active evidence of infection 8/11-Patient is seen after HBO, the wounds on the right foot appear to be about the same, the heel wound had some necrotic base over tendon that was debrided with a curette 8/21; patient is seen after HBO. The patient's wound on her dorsal foot actually looks reasonably good and there is  substantial amount of epithelialization however the open area distally still has a lot of necrotic debris partially bone. I cannot really get a good sense of just how deep this probes under the foot. She has been pressuring me this week to order medical maggots through a company in [place] for her. The problem I have is there is not a defined wound area here. On the positive side there is no purulence. She has been to see infectious disease she is still on Septra DS although I have not had a chance to review their notes 8/28; patient is seen in conjunction with HBO. The wounds on her foot continued to improve including the right dorsal foot substantially the, the distal part of this wound and the area on the right heel. We have been using a wound VAC over this chronically. She is still on trimethoprim as directed by infectious disease 9/4; patient is seen in conjunction with HBO. Right dorsal foot wound substantially anteriorly is better however she continues to have a deep wound in the distal part of this that is not responding. We have been using silver collagen under border foam Area on the right plantar medial  heel seems better. We have been using Hydrofera Blue 12/12/18 on evaluation today patient appears to be doing about the same with regard to her wound based on prior measurements. She does have some necrotic tissue noted on the lateral aspect of the wound that is going require a little bit of sharp debridement today. This includes what appears to be potentially either severely necrotic bone or tendon. Nonetheless other than that she does not appear to have any severe infection which is good news 9/18; it is been 2 weeks since I saw this wound. She is tolerating HBO well. Continued dramatic improvement in the area on the right dorsal foot. She still has a small wound on the heel that we have been using Hydrofera Blue. She continues with a wound VAC 9/24; patient has to be seen emergently today with a swelling on her right lateral lower leg. She says that she told Dr. Evette Doffing about this and also myself on a couple of occasions but I really have no recollection of this. She is not systemically unwell and her wound really looked good the last time I saw this. She showed this to providers at dialysis and she was able to verify that she was started on cephalexin today for 5 doses at dialysis. She dialyzes on Tuesday Thursday and Saturday. 10/2; patient is seen in conjunction with HBO. The area that is draining on the right anterior medial tibia is more extensive. Copious amounts of serosanguineous drainage with some purulence. We are still using the wound VAC on the original wound then it is stable. Culture I did of the original IandD showed MRSA I contacted dialysis she is now on vancomycin with dialysis treatments. I asked them to run a month 10/9; patient seen in conjunction with HBO. She had a new spontaneous open area just above the wound on the right medial tibia ankle. More swelling on the right medial tibia. Her wound on the foot looks about the same perhaps slightly better. There is no warmth  spreading up her leg but no obvious erythema. her MRI of the foot and ankle and distal tib-fib is not booked for next Friday I discussed this with her in great detail over multiple days. it is likely she has spreading infection upper leg at least involving the distal 25% above the  ankle. She knows that if I refer her to orthopedics for infectious disease they are going to recommend amputation and indeed I am not against this myself. We had a good trial at trying to heal the foot which is what she wanted along with antibiotics debridement and HBO however she clearly has spreading infection [probably staph aureus/MRSA]. Nevertheless she once again tells me she wants to wait the left of the MRI. She still makes comments about having her amputation done in [place]. 10/19; arrives today with significant swelling on the lateral right leg. Last culture I did showed Klebsiella. Multidrug-resistant. Cipro was intermediate sensitivity and that is what I have her on pending her MRI which apparently is going to be done on Thursday this week although this seems to be moving back and forth. She is not systemically unwell. We are using silver alginate on her major wound area on the right medial foot and the draining areas on the right lateral lower leg 10/26; MRI showed extensive abscess in the anterior compartment of the right leg also widespread osteomyelitis involving osseous structures of the midfoot and portions of the hindfoot. Also suspicion for osteomyelitis anterior aspect of the distal medial malleolus. Culture I did of the purulence once again showed a multidrug-resistant Klebsiella. I have been in contact with nephrology late last week and she has been started on cefepime at dialysis to replace the vancomycin We sent a copy of her MRI report to Dr. Geroge Baseman in [place] who is an orthopedic surgeon. The patient takes great stock in his opinion on this. She says she will go to [place] to have her  leg amputated if Dr. Geroge Baseman does not feel there is any salvage options. 11/2; she still is not talk to her orthopedic surgeon in [place]. Apparently he will call her at [phone] this afternoon. The quality of this is she has not allowed me to refer her anywhere. She has been told over and over that she needs this amputated but has not agreed to be referred. She tells me her blood sugar was 600 last night but she has not been febrile. 11/9; she never did got a call from the orthopedic surgeon in [place] therefore that is off the radar. We have arranged to get her see orthopedic surgery at [facility]. She still has a lot of draining purulence coming out of the new abscess in her right leg although that probably came from the osteomyelitis in her right foot and heel. Meanwhile the original wound on the right foot looks very healthy. Continued improvement. The issue is that the last MRI showed osteomyelitis in her right foot extensively she now has an abscess in the right anterior lower leg. There is nobody in [place] who will offer this woman anything but an amputation and to be honest that is probably what she needs. I think she still wants to talk about limb salvage although at this point I just do not see that. She has completed her vancomycin at dialysis which was for the original staph aureus she is still on cefepime for the more recent Klebsiella. She has had a long course of both of these antibiotics which should have benefited the osteomyelitis on the right foot as well as the abscess. 11/16; apparently [place] elective surgery is shut down because of COVID-19 pandemic. I have reached out to some contacts at [facility] to see if we can get her an orthopedic appointment there. I am concerned about continually leaving this but for the moment everything is  static. In fact her original large wound on this foot is closing down. It is the abscess on the right anterior leg that continues to drain  purulent serosanguineous material. She is not currently on any antibiotics however she had a prolonged course of vancomycin [1 month] as well as cefepime for a month 02/24/2019 on evaluation today patient appears to be doing better than the last time I saw her. This is not a patient that I typically see. With that being said I am covering for Dr. Dellia Nims this week and again compared to when I last saw her overall the wounds in particular seem to be doing significantly better which is good news. With that being said the patient tells me several disconcerting things. She has not been able to get in to see anyone for potential debridement in regard to her leg wounds although she tells me that she does not think it is necessary any longer because she is taking care of that herself. She noticed a string coming out of the lower wound on her leg over the last week. The patient states that she subsequently decided that we must of pack something in there and started pulling the string out and as it kept coming and coming she realized this was likely her tendon. With that being said she continued to remove as much of this as she could. She then I subsequently proceeded to using tubes of antibiotic ointment which she will stick down into the wound and then scored as much as she can until she sees it coming out of the other wound opening. She states that in doing this she is actually made things better and there is less redness and irritation. With regard to her foot wound she does have some necrotic tendon and tissue noted in one small corner but again the actual wound itself seems to be doing better with good granulation in general compared to my last evaluation. 12/7; continued improvement in the wound on the substantial part of the right medial foot. Still a necrotic area inferiorly that required debridement but the rest of this looks very healthy and is contracting. She has 2 wounds on the right lateral leg  which were her original drainage sites from her abscess but all of this looks a lot better as well. She has been using silver alginate after putting antibiotic biotic ointment in one wound and watching it come out the other. I have talked to her in some detail today. I had given her names of orthopedic surgeons at [facility] for second opinion on what to do about the right leg. I do not think the patient never called them. She has not been able to get a hold of the orthopedic surgeon in [place] that she had put a lot of faith in as being somebody would give her an opinion that she would trust. I talked to her today and said even if I could get her in to another orthopedic surgeon about the leg which she accept an amputation and she said she would not therefore I am not going to press this issue for the moment 12/14; continued improvement in his substantial wound on the right medial foot. There is still a necrotic area inferiorly with tightly adherent necrotic debris which I have been working on debriding each time she is here. She does not have an orthopedic appointment. Since last time she was here I looked over her cultures which were essentially MRSA on the foot wound and gram-negative  rods in the abscess on the anterior leg. 12/21; continued improvement in the area on the right medial foot. She is not up on this much and that is probably a good thing since I do not know it could support continuous ambulation. She has a small area on the right lateral leg which were remanence of the IandD's I did because of the abscess. I think she should probably have prophylactic antibiotics I am going to have to look this over to see if we can make an intelligent decision here. In the meantime her major wound is come down nicely. Necrotic area inferiorly is still there but looks a lot better 04/06/2019; she has had some improvement in the overall surface area on the right medial foot somewhat narrowedr both but  somewhat longer. The areas on the right lateral leg which were initial IandD sites are superficial. Nothing is present on the right heel. We are using silver alginate to the wound areas 1/18; right medial foot somewhat smaller. Still a deep probing area in the most distal recess of the wound. She has nothing open on the right leg. She has a new wound on the plantar aspect of her left fourth toe which may have come from just pulling skin. The patient using Medihoney on the wound on her foot under silver alginate. I cannot discourage her from this 2/1; 2-week follow-up using silver alginate on the right foot and her left fourth toe. The area on the right dorsal foot is contracted although there is still the deep area in the most distal part of the wound but still has some probing depth. No overt infection 2/15; 2-week follow-up. She continues to have improvement in the surface area on the dorsal right foot. Even the tunneling area from last time is almost closed. The area that was on the plantar part of her left fourth toe over the PIP is indeed closed 3/1; 2-week follow-up. Continued improvement in surface area. The original divot that we have been debriding inferiorly I think has full epithelialization although the epithelialization is gone down into the wound with probably 4 mm of depth. Even under intense illumination I am unable to see anything open here. The remanence of the wound in this area actually look quite healthy. We have been using silver alginate 3/15; 2-week follow-up. Unfortunately not as good today. She has a comma shaped wound on the dorsal foot however the upper part of this is larger. Under illumination debris on the surface She also tells [place] that she was on her right leg 2 times in the last couple of weeks mostly to reach up for things above her head etc. She felt a sharp pain in the right leg which she thinks is somewhere from the ankle to the knee. The patient has neuropathy and  is really uncertain. She cannot feel her foot so she does not think it was coming from there 3/29; 2-week follow-up. Her wound measures smaller. Surface of the wound appears reasonable. She is using silver alginate with underlying Medihoney. She has home health. X-rays I did of her tib-fib last time were negative although it did show arterial calcification 4/12; 2-week follow-up. Her wound measures smaller in length. Using manuka honey with silver alginate on top. She has home health. 4/26; 2-week follow-up. Her wound is smaller but still very adherent debris under illumination requiring debridement she has been using manuka honey with silver alginate. 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 Been using Hydrofera Blue. She is on suppressive Bactrim but apparently she has been taking it twice daily 6/7; I have not seen this wound and about 6 weeks. Since then she was up in [place]. By her own admission she was walking on the foot because she did not have a wheelchair. The wound is not nearly as healthy looking as it was the last time I saw this. We ordered different things for her but she only uses Medihoney and silver alginate. As far as I know she is on suppressive trimethoprim sulfamethoxazole. She does not admit to any fever or chills. Her CBGs apparently are at baseline however she is saying that she feels some discomfort on the lateral part of her ankle I looked over her last inflammatory markers from the summer 2020 at which time she had a deeply necrotic infected wound in this area. On 11/10/2018 her sedimentation rate was 56 and C-reactive protein 9.9. This was 107 and 29 on 07/29/2018. 6/17; the patient had a necrotic wound the last time she was here on the right dorsal foot. After debridement I did a culture. This showed a very resistant ESBL Klebsiella as well as Enterococcus. Her  x-ray of the foot which was done because of warmth and some discomfort showed bone destruction within the carpal bones involving the navicular acute cuboid lateral middle cuneiforms but essentially unchanged from her prior study which was done on 10/29/2018. The findings were felt to represent chronic osteomyelitis. We did inflammatory markers on her. Her white count was 5.25 sedimentation rate 16 and C-reactive protein at 11.1. Notable for the fact that in August 2020 her CRP was 9.9 and sedimentation rate 56. I have looked at her x-rays. It is true that the bone destruction is very impressive however the patient came into this clinic for the wound on her right foot with pieces of bone literally falling out anteriorly with purulent material. I am not exactly sure I could have expected anything different. She has not been systemically unwell no fever chills or blood sugars have been reasonable. 6/28; she arrives with a right heel closed. The substantial area on the right anterior foot looks healthy. Much better looking surface. I think we can change to [employer] seems to help this previously. She is getting her antibiotics at dialysis she should be just about finished 7/9; changed to [facility] last week. Surface wound looks satisfactory not much change in surface area however. She is going to [place] state next week this is usually a difficult thing for this patient follow-up will be for 2 weeks. 7/23; using Hydrofera Blue. She returns from her trip and the wound looks surprisingly good. Usually when this patient goes on trips she comes back with a lot of problems with the wound. She is saying that she sometimes feels an episodic "crunching" feeling on the lateral part of the foot. She is neuropathic and not feeling pain but wonders whether this could be a neuropathic dysesthesia. 11/13/19-Patient returns after 3 weeks, the wound itself is stable and patient states that there is nothing new  going on she is on some extra anxiety medications and is resisting the temptation to pick at the dry skin around the wound. 9/20; patient has not been here in over a month and I have not seen her in 2 months. The wound in terms of size I think is about the same. There  is no exposed bone. She has a nonviable surface on this. She is supposed to be using [facility] however she is also been using some form of honey preparation as well as a silver-based dressings. I do not think she has any pattern to this. 10/4; 2-week follow-up. Patient has been using some form of spray which she says has honey and silver to purchase this online she has been covering it with gauze. In spite of this the wound actually looks quite good. The deeper divot distally appears to be close down. There is a rim of epithelialization. 10/18; 2-week follow-up. Patient has been using her Hydrofera Blue covered with her silver honey spray that she got online. 11/1; 2-week follow-up. She is using Hydrofera Blue with a silver honey spray. Wound bed is measuring smaller. She has noticed that her foot is warmer on the right. She is concerned about infection. For a long period of time I had her on prophylactic trimethoprim sulfamethoxazole DS 1 tablet daily. She is asking for this to be restarted. The patient is walking on this foot because of repairs that are being done in a home her but her room is on the second floor she has to go up and down stairs. I have cautioned against this however as usual she will do exactly what she wants to do 11/15; 2-week follow-up. She uses Hydrofera Blue with a silver/honey spray which I have never heard of. I think her wound looks about the same. Some epithelialization. No evidence that this is infected. I think she is walking on this more than we agreed on. She is going on extensive vacation over Thanksgiving 12/3; 2-week follow-up. She is using Hydrofera Blue however over Thanksgiving she ran out of  this and she is simply been using Medihoney. In spite of this her wound is smaller almost divided into 2 now. She traveled extensively over Thanksgiving and actually looks quite good in spite of this. Usually this is been a marker of problems for her 12/17; 2-week follow-up. She is using Hydrofera Blue. The wound is smaller. Debris on the surface of this is fibrinous. She is traveling to [place] over the holidays which never bodes well for her wounds. I think she is walking more on her feet then she is even willing to admit and she tells me she does walk 2/11; using Hydrofera Blue. Her wounds are contracting however she walks in the clinic with a history that she has not been able to eat she has had vomiting. She also has right eye problems. Her blood sugar was 567. She had blood work done at dialysis and we called there to get her blood work although it still had not returned although they should have this by the end of the day we were told. She also stated that she was not sure what her blood sugar was as her glucose monitor was not working and not coming to tomorrow. She also for some reason does not think her insulin pump is working well. Her endocrinologist is Dr. Elayne Snare 06/03/2020 upon evaluation today patient actually appears to be doing excellent in regard to her wound. In fact she tells me it was not even bleeding until she picked a dry piece of skin off while we were getting ready to come in and see her today. Fortunately there is no signs of active infection which is great news and overall very pleased. She is going to be seeing her neurologist sometime shortly I believe it might  even be on Monday. Nonetheless there can proceed with the work-up as far as anything else going on with her eye the fact that her right eyelid is drooping. 3/25; right medial dorsal foot. She has superficial areas here that appear to be fully epithelialized she is using Medihoney and silver alginate and  some combination. She has a new area on the mid left foot at roughly the level of the fifth metatarsal base 4/84/8; right medial dorsal foot. Still superficial areas here. I cleaned up 1 of these today she has been using I think mostly Medihoney but I would like her to use silver alginate. The area on the mid part of her foot also looks improved on the left. Since she was last here she tells me that she noted pus in the area of her left foot. She told dialysis about this they did a culture and ultimately she is on IV antibiotics but were not sure which one. They referred her to [name] he said that he did not need to follow her. I have not really verified any of this and I do not know what antibiotic she is on at dialysis 4/29; patient has been using Medihoney to her wounds. She reports taking off the toenail to her left second toe. She states that home health discharged her and she has not had help with dressing changes. She denies any signs and symptoms of infection. 5/13; 2-week follow-up. Miraculously the wound on the right foot dorsally is healed. She has an area on the tip of her left third toe and an area in the left midfoot. 5/26; patient presents for 2-week follow-up. She has 2 open wounds 1 to her plantar foot and the other to the left third toe. She has noticed increased swelling and redness to the toe wound. It is unclear exactly what she is doing for dressing changes as she has multiple dressing options at home. She states that it is easiest to do [facility] on the plantar wound and Prisma or Medihoney on the toe wound. 6/3; saw Dr. Maryjane Hurter week. She put her on Keflex because of the swelling I think of the left second toe. She arrives today with new breakdown x3 on the dorsal right foot which is disappointing. She also complains of pain in the right ankle, 3 liquid stools per day, chills without fever 6/17; patient was in [place]. I think a little more ambulatory than I would like  to hear. In any case she still has the 3 small open areas on the dorsal part of her foot on the right medial leg these look about the same as last time. She is supposed to be using Prisma but I think used Medihoney. She has an area on the left lateral plantar foot which had undermining under her skin and a blood blister with the 2 areas communicating today. And finally an area on the tip of her left second toe Electronic Signature(s) Signed: 09/16/2020 5:27:23 PM By: Linton Ham MD Entered By: Linton Ham on 09/16/2020 13:43:41 -------------------------------------------------------------------------------- Physical Exam Details Patient Name: Date of Service: Nancy LLO [name] 09/16/2020 12:45 PM Medical Record Number: [id number] Patient Account Number: [id number] Date of Birth/Sex: Treating RN: 07-Jul-1970 (50 y.o. [name] Primary Care Provider: [name], [name] Other Clinician: Referring Provider: Treating Provider/Extender: [name], [name] Weeks in Treatment: 106 Constitutional Patient is hypertensive.. Pulse regular and within target range for patient.Marland Kitchen Respirations regular, non-labored and within target  range.. Temperature is normal and within the target range for the patient.Marland Kitchen Appears in no distress. Notes Wound exam Left plantar foot communicating area. I used a #5 curette to remove skin callus and some subcutaneous tissue. Hemostasis with silver nitrate Area on the left second toe likely a pressure area. This did not require debridement. There is much less redness and erythema on the tip of the toe. 3 small areas on the right medial foot dorsally these look somewhat dry but otherwise much the same as I remember. Electronic Signature(s) Signed: 09/16/2020 5:27:23 PM By: Linton Ham MD Entered By: Linton Ham on 09/16/2020 13:44:52 -------------------------------------------------------------------------------- Physician Orders  Details Patient Name: Date of Service: Nancy LLO [name] 09/16/2020 12:45 PM Medical Record Number: [id number] Patient Account Number: [id number] Date of Birth/Sex: Treating RN: 03-10-1971 (50 y.o. [name] Primary Care Provider: [name], [name] Other Clinician: Referring Provider: Treating Provider/Extender: [name], [name] Weeks in Treatment: [phone] Verbal / Phone Orders: No Diagnosis Coding ICD-10 Coding Code Description L97.514 Non-pressure chronic ulcer of other part of right foot with necrosis of bone E10.621 Type 1 diabetes mellitus with foot ulcer L97.521 Non-pressure chronic ulcer of other part of left foot limited to breakdown of skin L97.529 Non-pressure chronic ulcer of other part of left foot with unspecified severity Follow-up Appointments ppointment in 2 weeks. - Dr. Dellia Nims Return A Bathing/ Shower/ Hygiene May shower and wash wound with soap and water. Edema Control - Lymphedema / SCD / Other Bilateral Lower Extremities Elevate legs to the level of the heart or above for 30 minutes daily and/or when sitting, a frequency of: - throughout the day Avoid standing for long periods of time. Moisturize legs daily. - with dressing changes Off-Loading Open toe surgical shoe to: - to both feet Other: - minimal weight bearing right foot Wound Treatment Wound #50 - Foot Wound Laterality: Plantar, Left Cleanser: Wound Cleanser Every Other Day/30 Days Discharge Instructions: Cleanse the wound with wound cleanser or normal saline prior to applying a clean dressing using gauze sponges, not tissue or cotton balls. Prim Dressing: KerraCel Ag Gelling Fiber Dressing, 2x2 in (silver alginate) Every Other Day/30 Days ary Discharge Instructions: Apply silver alginate to wound bed as instructed Secondary Dressing: Woven Gauze Sponge, Non-Sterile 4x4 in Every Other Day/30 Days Discharge Instructions: Apply over primary dressing as directed. Secured  With: Elastic Bandage 4 inch (ACE bandage) Every Other Day/30 Days Discharge Instructions: Secure with ACE bandage as directed. Secured With: [employer], 4.5x3.1 (in/yd) Every Other Day/30 Days Discharge Instructions: Secure with Kerlix as directed. Secured With: Paper Tape, 2x10 (in/yd) Every Other Day/30 Days Discharge Instructions: Secure dressing with tape as directed. Wound #51 - T Second oe Wound Laterality: Left Cleanser: Soap and Water Every Other Day/15 Days Discharge Instructions: May shower and wash wound with dial antibacterial soap and water prior to dressing change. Prim Dressing: KerraCel Ag Gelling Fiber Dressing, 4x5 in (silver alginate) Every Other Day/15 Days ary Discharge Instructions: Apply silver alginate to wound bed as instructed Secondary Dressing: Woven Gauze Sponges 2x2 in Every Other Day/15 Days Discharge Instructions: Apply over primary dressing as directed. Secured With: Child psychotherapist, Sterile 2x75 (in/in) Every Other Day/15 Days Discharge Instructions: Secure with stretch gauze as directed. Secured With: Paper Tape, 1x10 (in/yd) Every Other Day/15 Days Discharge Instructions: Secure dressing with tape as directed. Wound #52 - Foot Wound Laterality: Right Cleanser: Soap and Water Every Other Day/15 Days Discharge Instructions: May  shower and wash wound with dial antibacterial soap and water prior to dressing change. Prim Dressing: Promogran Prisma Matrix, 4.34 (sq in) (silver collagen) Every Other Day/15 Days ary Discharge Instructions: Moisten collagen with saline or hydrogel Secondary Dressing: Woven Gauze Sponges 2x2 in Every Other Day/15 Days Discharge Instructions: Apply over primary dressing as directed. Secured With: Elastic Bandage 4 inch (ACE bandage) Every Other Day/15 Days Discharge Instructions: Secure with ACE bandage as directed. Secured With: Child psychotherapist, Sterile 2x75 (in/in) Every Other Day/15  Days Discharge Instructions: Secure with stretch gauze as directed. Secured With: Paper Tape, 1x10 (in/yd) Every Other Day/15 Days Discharge Instructions: Secure dressing with tape as directed. Electronic Signature(s) Signed: 09/16/2020 5:27:23 PM By: Linton Ham MD Signed: 09/16/2020 5:38:00 PM By: Lorrin Jackson Previous Signature: 09/16/2020 34:19:37 PM Version By: Lorrin Jackson Entered By: Lorrin Jackson on 09/16/2020 13:43:03 -------------------------------------------------------------------------------- Problem List Details Patient Name: Date of Service: Nancy LLO [name] 09/16/2020 12:45 PM Medical Record Number: [id number] Patient Account Number: [id number] Date of Birth/Sex: Treating RN: Jun 20, 1970 (50 y.o. [name] Primary Care Provider: [name], [name] Other Clinician: Referring Provider: Treating Provider/Extender: [name], [name] Weeks in Treatment: 106 Active Problems ICD-10 Encounter Code Description Active Date MDM Diagnosis L97.514 Non-pressure chronic ulcer of other part of right foot with necrosis of bone 09/03/2018 No Yes E10.621 Type 1 diabetes mellitus with foot ulcer 09/24/2018 No Yes L97.521 Non-pressure chronic ulcer of other part of left foot limited to breakdown of 06/24/2020 No Yes skin L97.529 Non-pressure chronic ulcer of other part of left foot with unspecified severity 07/29/2020 No Yes Inactive Problems ICD-10 Code Description Active Date Inactive Date M86.671 Other chronic osteomyelitis, right ankle and foot 09/03/2018 09/03/2018 L97.411 Non-pressure chronic ulcer of right heel and midfoot limited to breakdown of skin 09/17/2019 09/17/2019 L97.521 Non-pressure chronic ulcer of other part of left foot limited to breakdown of skin 04/20/2019 04/20/2019 L97.812 Non-pressure chronic ulcer of other part of right lower leg with fat layer exposed 02/24/2019 02/24/2019 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 09/16/2020 12:45 PM Medical Record Number: [id number] Patient Account Number: [id number] Date of Birth/Sex: Treating RN: February 17, 1971 (50 y.o. [name] Primary Care Provider: [name], [name] Other Clinician: Referring Provider: Treating Provider/Extender: [name], [name] Weeks in Treatment: 106 Subjective History of Present Illness (HPI) When36 year old diabetic who is known to have type 1 diabetes which is poorly controlled last hemoglobin A1c was 11%. She comes in with a ulcerated area on the left lateral foot which has been there for over 6 months. Was recently she has been treated by [name] of podiatry who saw her last on 05/28/2016. Review of his notes revealed that the patient had incision and drainage with placement of antibiotic beads to the left foot on 04/11/2016 for possible osteomyelitis of the cuboid bone. Over the last year she's had a history of amputation of the left fifth toe and a femoropopliteal popliteal bypass graft somewhere in April 2017. 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 His note [name] mentions that the patient  has been referred to me for further wound care and possibly great candidate for hyperbaric oxygen therapy due to recurrent osteomyelitis. However we do not have any x-rays of biopsy reports confirming this. He has been on several antibiotics including Bactrim and most recently is on doxycycline for an MRSA. I  understand, the patient was not a candidate for IV antibiotics as she has had previous PICC lines which resulted in blood clots in both arms. There was a x-ray report dated 04/04/2016 on [name] notes which showed evidence of fifth ray resection left foot with osteolytic changes noted to the fourth metatarsal and cuboid bone on the left. 06/13/2016 -- had a left foot x-ray which showed no acute fracture or dislocation and no definite radiographic evidence of osteomyelitis. Advanced osteopenia was seen. 06/20/2016 -- she has noticed a new wound on the right plantar foot in the region where she had a callus before. 06/27/16- the patient did have her x-ray of the right foot which showed no findings to suggest osteomyelitis. She saw her endocrinologist, [name], yesterday. Her A1c in January was 11. He also indicates mismanagement and noncompliance regarding her diabetes. She is currently on Bactrim for a lip infection. She is complaining of nausea, vomiting and diarrhea. She is unable to articulate the exact orders or dosing of the Bactrim; it is unclear when she will complete this. 07/04/2016 -- results from [facility] of ABIs with ankle waveforms were noted from 02/14/2016. The examination done on 06/27/2015 showed noncompressible ABIs with the right being 1.45 and the left being 1.33. The present examination showed a right ABI of 1.19 on the left of 1.33. The conclusion was that right normal ABI in the lower extremity at rest however compared to previous study which was noncompressible ABI may be falsely elevated side suggesting medial calcification. The left ABI suggested medial calcification. 08/01/2016 -- the patient had more redness and pain on her right foot and did not get to come to see as noted she see her PCP or go to the ER and decided to take some leftover metronidazole which she had at home. As usual, the patient does report she feels and is rather noncompliant. 08/08/2016 -- --  x-ray of the right foot -- FINDINGS:Transmetatarsal amputation is noted. No bony destruction is noted to suggest osteomyelitis. IMPRESSION: No evidence of osteomyelitis. Postsurgical changes are seen. MRI would be more sensitive for possible bony changes. Culture has grown Serratia Marcescens -- sensitive to Bactrim, ciprofloxacin, ceftazidime she was seen by [name] on 08/06/2016. He did not find any exposed bone, muscle, tendon, ligament or joint. There was no malodor and he did a excisional debridement in the office. ============ Old notes: 50 year old patient who is known to the wound clinic for a while had been away from the wound clinic since 09/01/2014. Over the last several months she has been admitted to various hospitals including [facility] [place]. She was treated for a right metatarsal osteomyelitis with a transmetatarsal amputation and this was done about 2 months ago. He has a small ulcerated area on the right heel and she continues to have an ulcerated area on the left plantar aspect of the foot. The patient was recently admitted to the [facility] hospital group between 7/12 and 10/18/2014. she was given 3 weeks of IV vancomycin and was to follow-up with her surgeons at [facility] and also took oral vancomycin for C. difficile colitis. Past medical history is significant for type 1 diabetes mellitus with neurological manifestations and  uncontrolled cellulitis, DVT of the left lower extremity, C. 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 MRI of the left foot done on 10/14/2014 showed no abscess or osteomyelitis. 04/27/15; this is a patient we know from previous stays in the wound care center. She is a type I diabetic I am not sure of her control currently. Since the last time I saw her she is had a right transmetatarsal amputation and has no wounds  on her right foot and has no open wounds. She is been followed at the wound care center at [facility] in [place]. She comes today with the desire to undergo hyperbaric treatment locally. Apparently one of her wound care providers in [place] has suggested hyperbarics. This is in response to an MRI from 04/18/15 that showed increased marrow signal and loss of the proximal fifth metatarsal cortex evidence of osteomyelitis with likely early osteomyelitis in the cuboid bone as well. She has a large wound over the base of the fifth metatarsal. She also has a eschar over her the tips of her toes on 1,3 and 5. She does not have peripheral pulses and apparently is going for an angiogram tomorrow which seems reasonable. After this she is going to infectious disease at [facility]. They have been using Medihoney to the large wound on the lateral aspect of the left foot to. The patient has known Charcot deformity from diabetic neuropathy. She also has known diabetic PAD. Surprisingly I can't see that she has had any recent antibiotics, the patient states the last antibiotic she had was at the end of November for 10 days. I think this was in response to culture that showed group G strep although I'm not exactly sure where the culture was from. She is also had arterial studies on 03/29/15. This showed a right ABI of 1.4 that was noncompressible. Her left ABI was 0.73. 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 It was not felt that arterial inflow was adequate for healing of a foot ulcer. Her Doppler waveforms looked monophasic ===== READMISSION 02/28/17; this is in an now 50 year old woman we've had at several different occasions in this clinic. 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 She has a remote ex-smoker. She was last seen in this clinic by Dr. Con Memos I think in May. More recently she is been followed by her podiatrist [name] an  infectious disease Dr. Megan Salon. She has 2 open wounds the major one is over the right first metatarsal head she also has a wound on the left plantar foot. an MRI of the right foot on 01/01/17 showed a soft tissue ulcer along the plantar aspect of the first metatarsal base consistent with osteomyelitis of the first metatarsal stump. Dr. 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 According to the patient this is been open for slightly over a month. She has been on a combination of Cipro 500 twice a day, Zyvox 600 twice a day and Flagyl 500 3 times a day for over a month now as directed by Dr. Megan Salon. cultures of the right foot earlier this year showed MRSA in January and Serratia in May. January also had a few viridans strep. Recent x-rays of both feet were done and [name] office and I don't have these reports. The patient has known PAD and has a  history of aleft femoropopliteal bypass in April 2017. 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 Dwyane Dee. In spite of this the last hemoglobin A1c I can see is 10.1 on 01/01/2017. She is being referred by 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 She is been using Medihoney to this area and also an area on the left midfoot. She is using healing sandals bilaterally. ABIs in this clinic at the left posterior tibial was 1.1 noncompressible on the right READMISSION Non invasive vascular NOVANT 5/18 Aftercare following surgery of the circulatory system Procedure Note - Interface, External Ris In - 08/13/2016 11:05 AM EDT Procedure: Examination consists of physiologic resting arterial pressures of the brachial and ankle arteries bilaterally with  continuous wave Doppler waveform analysis. Previous: Previous exam performed on 02/14/16 demonstrated ABIs of Rt = 1.19 and Lt = 1.33. Right: ABI = non-compressible PT 1.47 DP. S/P transmet amputation. , Left: ABI = 1.52, 2nd digit pressure = 87 mmHg Conclusions: Right: ABI (>1.3) may be falsely elevated, suggesting medial calcification. Left: ABI (>1.3) may be falsely elevated, suggesting medial calcification The patient is a now 50 year old type I diabetic is had multiple issues her graded to chronic diabetic foot ulcers. 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 We had her in the clinic lastin November. At that point she had wounds on her bilateral feet.she had wanted to try hyperbarics however the healogics review process denied her because she hadn't followed up with her vascular surgeon for her left femoropopliteal bypass. The bypass was done by Dr. Raul Del at [facility]. We made her a follow-up with Dr. Raul Del however she did not keep the appointment and therefore she was not approved The patient shows me a small wound on her left fourth metatarsal head on her phone. She developed rapid discoloration in the plantar aspect of the left foot and she was admitted to hospital from 2/2 through 05/10/17 with wet gangrene of the left foot osteomyelitis of the fourth metatarsal heads. She was admitted acutely ill with a temperature of 103. She was started on broad-spectrum vancomycin and cefepime. On 05/06/17 she was taken to the OR by [name] her podiatric surgeon for an incision and drainage irrigation of the left foot wound. Cultures from this surgery revealed group be strep and anaerobes. she was seen by Dr.Xu of orthopedic surgery and scheduled for a below-knee amputation which she u refused. Ultimately she was discharged on Levaquin and Flagyl for one month. MRI 05/05/17 done while she was in the hospital showed  abscess adjacent to the fourth metatarsal head and neck small abscess around the fourth flexor tendon. Inflammatory phlegmon and gas in the soft tissues along the lateral aspect of the fourth phalanx. Findings worrisome for osteomyelitis involving the fourth proximal and middle phalanx and also the third and fourth metatarsals. Finally the patient had actually shortly before this followed up with Dr. Raul Del at no time on 04/29/17. He felt that her left femoropopliteal bypass was patent he felt that her left-sided toe pressures more than adequate for healing a wound on the left foot. This was before her acute presentation. 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 05/28/17; she is started hyperbarics. The patient tells me that for  some reason she was not actually on Levaquin but I think on ciprofloxacin. She was on Flagyl. She only started her Levaquin yesterday due to some difficulty with the pharmacy and perhaps her sister picking it up. She has an appointment with [name] tomorrow and with infectious disease early next week. She has no new complaints 06/06/17; the patient continues in hyperbarics. She saw [name] on 05/29/17 who is her podiatric surgeon. He is elected for a transmetatarsal amputation on 06/27/17. I'm not sure at what level he plans to do this amputation. The patient is unaware ooShe also saw Dr. Megan Salon of infectious disease who elected to continue her on current antibiotics I think this is ciprofloxacin and Flagyl. I'll need to clarify with her tomorrow if she actually has this. We're using silver alginate to the actual wound. Necrotic surface today with material under the flap of her foot. ooOriginal MRI showed abscesses as well as osteomyelitis of the proximal and middle fourth phalanx and the third and fourth metatarsal heads 06/11/17; patient continues in hyperbarics and continues on oral antibiotics. She is doing well. The wound looks better. The necrotic part of this under  the flap in her superior foot also looks better. she is been to see [name]. I haven't had a chance to look at his note. Apparently he has put the transmetatarsal amputation on hold her request it is still planning to take her to the OR for debridement and product application ACEL. I'll see if I can find his note. I'll therefore leave product ordering/requests to [name] for now. I was going to look at Dermagraft 06/18/17-she is here in follow-up evaluation for bilateral foot wounds. She continues with hyperbaric therapy. She states she has been applying manuka honey to the right plantar foot and alternate manuka honey and silver alginate to the left foot, despite our orders. We will continue with same treatment plan and she will follo up next week. 06/25/17; I have reviewed [name] last note from 3/11. She has operative debridement in 2 days' time. By review his note apparently they're going to place there is skin over the majority of this wound which is a good choice. She has a small satellite area at the most proximal part of this wound on the left plantar foot. The area on the right plantar foot we've been using silver alginate and it is close to healing. 07/02/17; unfortunately the patient was not easily approved for [name] proposed surgery. I'm not completely certain what the issue is. She has been using silver alginate to the wound she has completed a first course of hyperbarics. She is still on Levaquin and Flagyl. I have really lost track of the time course here.I suspect she should have another week to 2 of antibiotics. I'll need to see if she is followed up with infectious disease Dr. Megan Salon 07/09/17; the patient is followed up with Dr. Megan Salon. 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 She continues on Levaquin and metronidazole continuing both of these for now I think she is been on fr about 6 weeks. She still has some drainage but no pain.  No fever. Her had been plans for her to go to the OR for operative debridement with her podiatrist [name], I am not exactly sure where that is. I'll probably slip a note to [name] today. I note that she follows with Dr. Dwyane Dee of endocrinology. We have her recertified for  hyperbaric oxygen. I have not heard about Dermagraft however I'll see if [name] is planning a skin substitute as well 07/16/17; the patient tells me she is just about out of Barney. I'll need to check Dr. Hale Bogus last notes on this. She states she has plenty of Flagyl however. She comes in today complaining of pain in the right lateral foot which she said lasted for about a day. The wound on the right foot is actually much more medially. She also tells me that the [facility] cost a lot of pain in the left foot wound and she turned back to silver alginate. Finally Dermagraft has a $644 per application co-pay. She cannot afford this 07/23/17; patient arrives today with the wound not much smaller. There is not much new to add. She has not heard from [name] all try to put in a call to them today. She was asking about Dermagraft again and she has an over $034 per application co-pay she states that she would be willing to try to do a payment plan. I been tried to avoid this. We've been using silver alginate, I'll change to [facility] 07/30/17-She is here in follow-up evaluation for left foot ulcer. She continues hyperbaric medicine. The left foot ulcer is stable we will continue with same treatment plan 08/06/17; she is here for evaluation of her left foot ulcer. Currently being treated for hyperbarics or underlying osteomyelitis. She is completed antibiotics. The left foot ulcer is better smaller with healthier looking granulation. For various reasons I am not really clear on we never got her back to the OR with [name]. He did not respond to my secure text message. Nevertheless I think that surgery on this point is  not necessary nor am I completely clear that a skin substitute is necessary The patient is complaining about pain on the outside of her right foot. 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 There is no erythema. She also states the foot is warm versus her other part of her upper leg and this is largely true. It is not totally clear to me what's causing this. She thinks it's different from her usual neuropathy pain 08/13/17; she arrives in clinic today with a small wound which is superficial on her right first metatarsal head. 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 She tells [place] she was up on her feet over the Mother's Day celebration. ooThe large wound is on the left foot. Continues with hyperbarics for underlying osteomyelitis. We're using Hydrofera Blue. She asked me today about where we were with Dermagraft. I had actually excluded this because of the co-pay however she wants to assume this therefore I'll recheck the co-pay an order for next week. 08/20/17; the patient agreed to accept the co-pay of the first Apligraf which we applied today. She is disappointed she is finishing hyperbarics will run this through the insurance on the extent of the foot infection and the extent of the wound that she had however she is already had 60 dive's. Dermagraft No. 1 08/27/17; Dermagraft No. 2. She is not eligible for any more hyperbaric treatments this month. She reports a fair amount of drainage and she actually changed to the external dressings without disturbing the direct contact layer 09/03/17; the patient arrived in clinic today with the wound superficially looking quite healthy. Nice vibrant red tissue with some advancing epithelialization although not as much adherence of the flap as I might like. However she noted on her own  fourth toe some bogginess and she brought that to our attention. Indeed this was boggy feeling like a possibility of subcutaneous fluid. She stated  that this was similar to how an issue came up on the lateral foot that led to her fifth ray amputation. She is not been unwell. We've been using Dermagraft 09/10/17; the culture that I did not last week was MRSA. She saw Dr. Megan Salon this morning who is going to start her on vancomycin. I had sent him a secure a text message yesterday. I also spoke with her podiatric surgeon [name] about surgery on this foot the options for conserving a functional foot etc. Promised me he would see her and will make back consultation today. Paradoxically her actual wound on the plantar aspect of her left foot looks really quite good. I had given her 5 days worth of Baxdella to cover her for MRSA. Her MRI came back showing osteomyelitis within the third metatarsal shaft and head and base of the third and fourth proximal phalanx. She had extensive inflammatory changes throughout the soft tissue of the lateral forefoot. With an ill-defined fluid around the fourth metatarsal extending into the plantar and dorsal soft tissues 09/19/17; the patient is actually on oral Septra and Flagyl. She apparently refused IV vancomycin. She also saw [name] at my request who is planning her for a left BKA sometime in mid July. MRI showed osteomyelitis within the third metatarsal shaft and head and the basis of the third and fourth proximal phalanx. I believe there was felt to be possible septic arthritis involving the third MTP. 09/26/17; the patient went back to Dr. Megan Salon at my suggestion and is now receiving IV daptomycin. Her wound continues to look quite good making the decision to proceed with a transmetatarsal amputation although more difficult for the patient. I believe in my extensive discussions with her she has a good sense of the pros and cons of this. I don't NV the tuft decision she has to make. She has an appointment with [name] I believe in mid July and I previously spoken to him about this issue Has we had used  3 previous Dermagraft. Given the condition of the wound surface I went ahead and added the fourth one today area and I did this not fully realizing that she'll be traveling to [place] next week. I'm hopeful she can come back in 2 weeks 10/21/17; Her same Dermagraft on for about 3-1/2 weeks. In spite of this the wound arrives looking quite healthy. There is been a lot of healing dimensions are smaller. Looking at the square shaped wound she has now there is some undermining and some depth medially under the undermining although I cannot palpate any bone. No surrounding infection is obvious. She has difficult questions about how to look at this going forward vis--vis amputations versus continued medical therapy. T be truthful the wound is looks so o healthy and it is continued to contract. Hard to justify foot surgery at this point although I still told her that I think it might come to that if we are not able to eradicate the underlying MRSA. She is still highly at risk and she understands this 11/06/17 on evaluation today patient appears to be doing better in regard to her foot ulcer. She's been tolerating the dressing changes without complication. Currently she is here for her Dermagraft #6. Her wound continues to make excellent progress at this point. She does not appear to have any evidence of infection  which is good news. 11/13/17 on evaluation today patient appears to be doing excellent at this time. She is here for repeat Dermagraft application. This is #7. Overall her wound seems to be making great progress. 12/05/17; the patient arrives with the wound in much better condition than when I last saw this almost 6 weeks ago. She still has a small probing area in the left metatarsal head region on the lateral aspect of her foot. We applied her last Dermagraft today. ooSince the last time she is here she has what appears to a been a blood blister on the plantar aspect of left foot although I don't see  this is threatening. There is also a thick raised tissue on the right mid metatarsal head region. This was not there I don't think the last time she was here 3 weeks ago. 12/12/17; the patient continues to have a small programming area in the left metatarsal head region on the lateral aspect of her foot which was the initial large surgical wound. I applied her last Apligraf last week. I'm going to use Endoform starting today ooUnfortunately she has an excoriated area in the left mid foot and the right mid foot. The left midfoot looks like a blistered area this was not opened last week it certainly is open today. Using silver alginate on these areas. She promises me she is offloading this. 12/19/17; the small probing area in the left metatarsal head eyes think is shallower. In general her original wound looks better. We've been using Endoform. The area inferiorly that I think was trauma last week still requires debridement a lot of nonviable surface which I removed. She still has an open open area distally in her foot ooSimilarly on the right foot there is tightly adherent surface debris which I removed. Still areas that don't look completely epithelialized. This is a small open area. We used silver alginate on these areas 12/26/2017; the patient did not have the supplies we ordered from last week including the Endoform. The original large wound on the left lateral foot looks healthy. She still has the undermining area that is largely unchanged from last week. She has the same heavily callused raised edged wounds on the right mid and left midfoot. Both of these requiring debridement. We have been using silver alginate on these areas 01/02/2018; there is still supply issues. We are going to try to use Prisma but I am not sure she actually got it from what she is saying. She has a new open area on the lateral aspect of the left fourth toe [previous fifth ray amputation]. Still the one tunneling area over  the fourth metatarsal head. The area is in the midfoot bilaterally still have thick callus around them. She is concerned about a raised swelling on the lateral aspect of the foot. However she is completely insensate 01/10/2018; we are using Prisma to the wounds on her bilateral feet. Surprisingly the tunneling area over the left fourth metatarsal head that was part of her original surgery has closed down. She has a small open area remaining on the incision line. 2 open areas in the midfoot. 02/10/2018; the patient arrives back in clinic after a month hiatus. She was traveling to visit family in [place]. Is fairly clear she was not offloading the areas on her feet. The original wound over the left lateral foot at the level of metatarsal heads is reopened and probes medially by about a centimeter or 2. She notes that a week ago she  had purulent drainage come out of an area on the left midfoot. Paradoxically the worst area is actually on the right foot is extensive with purulent drainage. We will use silver alginate today 02/17/2018; the patient has 3 wounds one over the left lateral foot. She still has a small area over the metatarsal heads which is the remnant of her original surgical wound. This has medial probing depth of roughly 1.4 cm somewhat better than last week. The area on the right foot is larger. We have been using silver alginate to all areas. The area on the right foot and left foot that we cultured last week showed both Klebsiella and Proteus. Both of these are quinolone sensitive. The patient put her's self on Bactrim and Flagyl that she had left hanging around from prior antibiotic usages. She was apparently on this last week when she arrived. I did not realize this. Unfortunately the Bactrim will not cover either 1 of these organisms. We will send in Cipro 500 twice daily for a week 03/04/2018; the patient has 2 wounds on the left foot one is the original wound which was a surgical  wound for a deep DFU. At one point this had exposed bone. She still has an area over the fourth metatarsal head that probes about 1.4 cm although I think this is better than last week. I been using silver nitrate to try and promote tissue adherence and been using silver alginate here. ooShe also has an area in the left midfoot. This has some depth but a small linear wound. Still requiring debridement. ooOn the right midfoot is a circular wound. A lot of thick callus around this area. ooWe have been using silver alginate to all wound areas ooShe is completed the ciprofloxacin I gave her 2 weeks ago. 03/11/2018; the patient continues to have 2 open areas on the left foot 1 of which was the original surgical wound for a deep DFU. Only a small probing area remains although this is not much different from last week we have been using silver alginate. The other area is on the midfoot this is smaller linear but still with some depth. We have been using silver alginate here as well ooOn the right foot she has a small circular wound in the mid aspect. This is not much smaller than last time. We have been using silver alginate here as well 03/18/2018; she has 3 wounds on the left foot the original surgical wound, a very superficial wound in the mid aspect and then finally the area in the mid plantar foot. She arrives in today with a very concerning area in the wound in the mid plantar foot which is her most proximal wound. There is undermining here of roughly 1-1/2 cm superiorly. Serosanguineous drainage. She tells me she had some pain on for over the weekend that shot up her foot into her thigh and she tells me that she had a nodule in the groin area. ooShe has the single wound in the right foot. ooWe are using endoform to both wound areas 03/24/2018; the patient arrives with the original surgical wound in the area on the left midfoot about the same as last week. There is a collection of fluid  under the surface of the skin extending from the surgical wound towards the midfoot although it does not reach the midfoot wound. The area on the right foot is about the same. Cultures from last week of the left midfoot wound showed abundant Klebsiella abundant Enterococcus faecalis  and moderate methicillin resistant staph I gave her Levaquin but this would have only covered the Klebsiella. She will need linezolid 04/01/2018; she is taking linezolid but for the first few days only took 1 a day. I have advised her to finish this at twice daily dosing. In any case all of her wounds are a lot better especially on the left foot. The original surgical wound is closed. The area on the left midfoot considerably smaller. The area on the right foot also smaller. 04/08/2018; her original surgical wound/osteomyelitis on the left foot remains closed. She has area on the left foot that is in the midfoot area but she had some streaking towards this. This is not connected with her original wound at least not visually. ooSmall wound on the right midfoot appears somewhat smaller. 04/15/18; both wounds looks better. Original wound is better left midfoot. Using silver alginate 1/21; patient states she uses saltwater soak in, stones or remove callus from around her wounds. She is also concerned about a blood blister she had on the left foot but it simply resolved on its own. We've been using silver alginate 1/28; the patient arrives today with the same streaking area from her metatarsals laterally [the site of her original surgical wound] down to the middle of her foot. There is some drainage in the subcutaneous area here. This concerns me that there is actually continued ongoing infection in the metatarsals probably the fourth and third. This fixates an MRI of the foot without contrast [chronic renal failure] ooThe wound in the mid part of the foot is small but I wonder whether this area actually connects with the more  distal foot. ooThe area on the right midfoot is probably about the same. Callus thick skin around the small wound which I removed with a curette we have been using silver alginate on both wound areas 2/4; culture I did of the draining site on the left foot last time grew methicillin sensitive staph aureus. MRI of the left foot showed interval resolution of the findings surrounding the third metatarsal joint on the prior study consistent with treated osteomyelitis. Chronic soft tissue ulceration in the plantar and lateral aspect of the forefoot without residual focal fluid collection. No evidence of recurrent osteomyelitis. 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 She tells me she had nausea and vomiting over the weekend and missed 2 days. In general her foot looks much the same. She has a small open area just below the left fourth metatarsal head. A linear area in the left midfoot. Some discoloration extending from the inferior part of this into the left lateral foot although this appears to be superficial. She has a small area on the right midfoot which generally looks smaller after debridement 2/18; the patient is completing his cephalexin and has another 2 days. She continues to have open areas on the left and right foot. 2/25; she is now off antibiotics. The area on the left foot at the site of her original surgical wound has closed yet again. She still has open areas in the mid part of her foot however these appear smaller. The area on the right mid foot looks about the same. We have been using silver alginate She tells me she had a  serious hypoglycemic spell at home. She had to have EMS called and get IV dextrose 3/3; disappointing on the left lateral  foot large area of necrotic tissue surrounding the linear area. This appears to track up towards the same original surgical wound. Required extensive debridement. The area on the right plantar foot is not a lot better also using silver 3/12; the culture I did last time showed abundant enterococcus. I have prescribed Augmentin, should cover any unrecognized anaerobes as well. In addition there were a few MRSA and Serratia that would not be well covered although I did not want to give her multiple antibiotics. She comes in today with a new wound in the right midfoot this is not connected with the original wound over her MTP a lot of thick callus tissue around both wounds but once again she said she is not walking on these areas 3/17-Patient comes in for follow-up on the bilateral plantar wounds, the right midfoot and the left plantar wound. Both these are heavily callused surrounding the wounds. We are continuing to use silver alginate, she is compliant with offloading and states she uses a wheelchair fairly often at home 3/24; both wound areas have thick callus. However things actually look quite a bit better here for the majority of her left foot and the right foot. 3/31; patient continues to have thick callused somewhat irritated looking tissue around the wounds which individually are fairly superficial. There is no evidence of surrounding infection. We have been using silver alginate however I change that to [facility] today 4/17; patient returns to clinic after having a scare with Covid she tested negative in her primary doctor's office. She has been using Hydrofera Blue. She does not have an open area on the right foot. On the left foot she has a small open area with the mid area not completely viable. She showed me pictures of what looks like a hemorrhagic blister from several days ago but that seems to have healed over this was on the lateral left foot 4/21; patient comes in to clinic with  both her wounds on her feet closed. However over the weekend she started having pain in her right foot and leg up into the thigh. She felt as though she was running a low-grade fever but did not take her temperature. She took a doxycycline that she had leftover and yesterday a single Septra and metronidazole. She thinks things feel somewhat better. 4/28; duplex ultrasound I ordered last week was negative for DVT or superficial thrombophlebitis. She is completed the doxycycline I gave her. States she is still having a lot of pain in the right calf and right ankle which is no better than last week. She cannot sleep. She also states she has a temperature of up to 101, coughing and complaining of visual loss in her bilateral eyes. Apparently she was tested for Covid 2 weeks ago at [facility] and that was negative. Readmission: 09/03/18 patient presents back for reevaluation after having been evaluated at the end of April regarding erythema and swelling of her right lower extremity. Subsequently she ended up going to the hospital on 07/29/18 and was admitted not to be discharged until 08/08/18. Unfortunately it was noted during the time that she was in the hospital that she did have methicillin-resistant Staphylococcus aureus as the infection noted at the site. It was also determined that she did have osteomyelitis which appears to be fairly significant. She was treated with vancomycin and in fact is still on IV  vancomycin at dialysis currently. This is actually slated to continue until 09/12/18 at least which will be the completion of the six weeks of therapy. Nonetheless based on what I'm seeing at this point I'm not sure she will be anywhere near ready to discontinue antibiotics at that time. Since she was released from the hospital she was seen by [name] who is her podiatrist on 08/27/18. His note specifically states that he is recommended that the patient needs of one knee amputation on the right as she has a  life- threatening situation that can lead quickly to sepsis. The patient advised she would like to try to save her leg to which [name] apparently told her that this was against all medical advice. She also want to discontinue the Wound VAC which had been initiated due to the fact that she wasn't pleased with how the wound was looking and subsequently she wanted to pursue applying Medihoney at that time. He stated that he did not believe that the right lower extremity was salvageable and that the patient understood but would still like to attempt hyperbaric option therapy if it could be of any benefit. She was therefore referred back to [place] for further evaluation. He plans to see her back next week. Upon inspection today patient has a significant amount purulent drainage noted from the wound at this point. The bone in the distal portion of her foot also appears to be extremely necrotic and spongy. When I push down on the bone it bubbles and seeps purulent drainage from deeper in the end of the foot. I do not think that this is likely going to heal very well at all and less aggressive surgical debridement were undertaken more than what I believe we can likely do here in our office. 09/12/2018; I have not seen this patient since the most recent hospitalization although she was in our clinic last week. I have reviewed some of her records from a complex hospitalization. 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 There is situation was complicated by MRSA bacteremia and acute on chronic renal failure now on dialysis. She is receiving vancomycin at dialysis. We started her on Dakin's wet-to-dry last week she is changing this daily. There is still purulent drainage coming out of her foot. Although she is apparently "agreeable" to a below-knee amputation which is been suggested by multiple clinicians she wants this to be done in [place]. She apparently has a  telehealth visit with that provider sometime in late [place] 6/24. I have told her I think this is probably too long. Nevertheless I could not convince her to allow a local doctor to perform BKA. 09/19/2018; the patient has a large necrotic area on the right anterior foot. She has had previous transmetatarsal amputations. Culture I did last week showed MRSA nothing else she is on vancomycin at dialysis. She has continued leaking purulent drainage out of the distal part of the large circular wound on the right anterior foot. She apparently went to see Dr. 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 Somehow that translated into her being referred to plastic surgery for debridement of the area. I gather she basically refused amputation although I do not have a copy of Dr. Berenice Primas notes. The patient really wants to have a trial of hyperbaric oxygen. I agreed with initial assessment in this clinic that this was probably too far along to benefit however if she is going to  have plastic surgery I think she would benefit from ancillary hyperbaric oxygen. The issue here is that the patient has benefited as maximally as any patient I have ever seen from hyperbaric oxygen therapy. Most recently she had exposed bone on the lateral part of her left foot after a surgical procedure and that actually has closed. She has eschared areas in both heels but no open area. She is remained systemically well. I am not optimistic that anything can be done about this but the patient is very clear that she wants an attempt. The attempt would include a wound VAC further debridements and hyperbaric oxygen along with IV antibiotics. 6/26; I put her in for a trial of hyperbaric oxygen only because of the dramatic response she has had with wounds on her left midfoot earlier this year which was a surgical wound that went straight to her bone over the metatarsal heads and also remotely the left third toe. We will  see if we can get this through our review process and insurance. She arrives in clinic with again purulent material pouring out of necrotic bone on the top of the foot distally. There is also some concerning erythema on the front of the leg that we marked. It is bit difficult to tell how tender this is because of neuropathy. I note from infectious disease that she had her vancomycin extended. 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 She had the wound VAC on for part of the week. The rest of the time she is putting various things on this including Medihoney, "ionized water" silver sorb gel etc. 7/7; follow-up along with HBO. She is still on vancomycin at dialysis. She has a large open area on the dorsal right foot and a small dark eschar area on her heel. There is a lot less erythema in the area and a lot less tenderness. From an infection point of view I think this is better. She still has a lot of necrosis in the remaining right forefoot [previous TMA] we are still using the wound VAC in this area 7/16; follow-up along with HBO. I put her on linezolid after she finished her vancomycin. We started this last Friday I gave her 2 weeks worth. I had the expectation that she would be operatively debrided by Dr. Marla Roe but that still has not happened yet. Patient phoned the office this week. She arrives for review today after HBO. The distal part of this wound is completely necrotic. Nonviable pieces of tendon bone was still purulent drainage. Also concerning that she has black eschar over the heel that is expanding. I think this may be indicative of infection in this area as well. She has less erythema and warmth in the ankle and calf but still an abnormal exam 7/21 follow-up along with HBO. I will renew her linezolid after checking a CBC with differential monitoring her blood counts especially her platelets. She was supposed to have surgery yesterday but if I am reading  things correctly this was canceled after her blood sugar was found to be over 500. I thought Dr. Marla Roe who called me said that they were sending her to the ER but the patient states that was not the case. 7/28. Follow-up along with HBO. She is on linezolid I still do not have any lab work from dialysis even though I called last week. The patient is concerned about an area on her left lateral foot about the level of the base of her  fifth metatarsal. I did not really see anything that ominous here however this patient is in [place] ability to point out problems that she is sensing and she has been accurate in the past Finally she received a call from Dr. Marla Roe who is referring her to another orthopedic surgeon stating that she is too booked up to take her to the operating room now. Was still using a wound VAC on the foot 8/3 -Follow-up after HBO, she is got another week of linezolid, she is to call ID for an appointment, x-rays of both feet were reviewed, the left foot x-ray with third MTP joint osteo- Right foot x-ray widespread osteo-in the right midfoot Right ankle x-ray does not show any active evidence of infection 8/11-Patient is seen after HBO, the wounds on the right foot appear to be about the same, the heel wound had some necrotic base over tendon that was debrided with a curette 8/21; patient is seen after HBO. The patient's wound on her dorsal foot actually looks reasonably good and there is substantial amount of epithelialization however the open area distally still has a lot of necrotic debris partially bone. I cannot really get a good sense of just how deep this probes under the foot. She has been pressuring me this week to order medical maggots through a company in [place] for her. The problem I have is there is not a defined wound area here. On the positive side there is no purulence. She has been to see infectious disease she is still on Septra DS although I have not had a  chance to review their notes 8/28; patient is seen in conjunction with HBO. The wounds on her foot continued to improve including the right dorsal foot substantially the, the distal part of this wound and the area on the right heel. We have been using a wound VAC over this chronically. She is still on trimethoprim as directed by infectious disease 9/4; patient is seen in conjunction with HBO. Right dorsal foot wound substantially anteriorly is better however she continues to have a deep wound in the distal part of this that is not responding. We have been using silver collagen under border foam ooArea on the right plantar medial heel seems better. We have been using Hydrofera Blue 12/12/18 on evaluation today patient appears to be doing about the same with regard to her wound based on prior measurements. She does have some necrotic tissue noted on the lateral aspect of the wound that is going require a little bit of sharp debridement today. This includes what appears to be potentially either severely necrotic bone or tendon. Nonetheless other than that she does not appear to have any severe infection which is good news 9/18; it is been 2 weeks since I saw this wound. She is tolerating HBO well. Continued dramatic improvement in the area on the right dorsal foot. She still has a small wound on the heel that we have been using Hydrofera Blue. She continues with a wound VAC 9/24; patient has to be seen emergently today with a swelling on her right lateral lower leg. She says that she told Dr. Evette Doffing about this and also myself on a couple of occasions but I really have no recollection of this. She is not systemically unwell and her wound really looked good the last time I saw this. She showed this to providers at dialysis and she was able to verify that she was started on cephalexin today for 5 doses at dialysis.  She dialyzes on Tuesday Thursday and Saturday. 10/2; patient is seen in conjunction with  HBO. The area that is draining on the right anterior medial tibia is more extensive. Copious amounts of serosanguineous drainage with some purulence. We are still using the wound VAC on the original wound then it is stable. Culture I did of the original IandD showed MRSA I contacted dialysis she is now on vancomycin with dialysis treatments. I asked them to run a month 10/9; patient seen in conjunction with HBO. She had a new spontaneous open area just above the wound on the right medial tibia ankle. More swelling on the right medial tibia. Her wound on the foot looks about the same perhaps slightly better. There is no warmth spreading up her leg but no obvious erythema. her MRI of the foot and ankle and distal tib-fib is not booked for next Friday I discussed this with her in great detail over multiple days. it is likely she has spreading infection upper leg at least involving the distal 25% above the ankle. She knows that if I refer her to orthopedics for infectious disease they are going to recommend amputation and indeed I am not against this myself. We had a good trial at trying to heal the foot which is what she wanted along with antibiotics debridement and HBO however she clearly has spreading infection [probably staph aureus/MRSA]. Nevertheless she once again tells me she wants to wait the left of the MRI. She still makes comments about having her amputation done in [place]. 10/19; arrives today with significant swelling on the lateral right leg. Last culture I did showed Klebsiella. Multidrug-resistant. Cipro was intermediate sensitivity and that is what I have her on pending her MRI which apparently is going to be done on Thursday this week although this seems to be moving back and forth. She is not systemically unwell. We are using silver alginate on her major wound area on the right medial foot and the draining areas on the right lateral lower leg 10/26; MRI showed extensive abscess  in the anterior compartment of the right leg also widespread osteomyelitis involving osseous structures of the midfoot and portions of the hindfoot. Also suspicion for osteomyelitis anterior aspect of the distal medial malleolus. Culture I did of the purulence once again showed a multidrug-resistant Klebsiella. I have been in contact with nephrology late last week and she has been started on cefepime at dialysis to replace the vancomycin We sent a copy of her MRI report to Dr. Geroge Baseman in [place] who is an orthopedic surgeon. The patient takes great stock in his opinion on this. She says she will go to [place] to have her leg amputated if Dr. Geroge Baseman does not feel there is any salvage options. 11/2; she still is not talk to her orthopedic surgeon in [place]. Apparently he will call her at [phone] this afternoon. The quality of this is she has not allowed me to refer her anywhere. She has been told over and over that she needs this amputated but has not agreed to be referred. She tells me her blood sugar was 600 last night but she has not been febrile. 11/9; she never did got a call from the orthopedic surgeon in [place] therefore that is off the radar. We have arranged to get her see orthopedic surgery at [facility]. She still has a lot of draining purulence coming out of the new abscess in her right leg although that probably came from the osteomyelitis in her right foot  and heel. Meanwhile the original wound on the right foot looks very healthy. Continued improvement. The issue is that the last MRI showed osteomyelitis in her right foot extensively she now has an abscess in the right anterior lower leg. There is nobody in [place] who will offer this woman anything but an amputation and to be honest that is probably what she needs. I think she still wants to talk about limb salvage although at this point I just do not see that. She has completed her vancomycin at dialysis which was for the  original staph aureus she is still on cefepime for the more recent Klebsiella. She has had a long course of both of these antibiotics which should have benefited the osteomyelitis on the right foot as well as the abscess. 11/16; apparently [place] elective surgery is shut down because of COVID-19 pandemic. I have reached out to some contacts at [facility] to see if we can get her an orthopedic appointment there. I am concerned about continually leaving this but for the moment everything is static. In fact her original large wound on this foot is closing down. It is the abscess on the right anterior leg that continues to drain purulent serosanguineous material. She is not currently on any antibiotics however she had a prolonged course of vancomycin [1 month] as well as cefepime for a month 02/24/2019 on evaluation today patient appears to be doing better than the last time I saw her. This is not a patient that I typically see. With that being said I am covering for Dr. Dellia Nims this week and again compared to when I last saw her overall the wounds in particular seem to be doing significantly better which is good news. With that being said the patient tells me several disconcerting things. She has not been able to get in to see anyone for potential debridement in regard to her leg wounds although she tells me that she does not think it is necessary any longer because she is taking care of that herself. She noticed a string coming out of the lower wound on her leg over the last week. The patient states that she subsequently decided that we must of pack something in there and started pulling the string out and as it kept coming and coming she realized this was likely her tendon. With that being said she continued to remove as much of this as she could. She then I subsequently proceeded to using tubes of antibiotic ointment which she will stick down into the wound and then scored as much as she can until  she sees it coming out of the other wound opening. She states that in doing this she is actually made things better and there is less redness and irritation. With regard to her foot wound she does have some necrotic tendon and tissue noted in one small corner but again the actual wound itself seems to be doing better with good granulation in general compared to my last evaluation. 12/7; continued improvement in the wound on the substantial part of the right medial foot. Still a necrotic area inferiorly that required debridement but the rest of this looks very healthy and is contracting. She has 2 wounds on the right lateral leg which were her original drainage sites from her abscess but all of this looks a lot better as well. She has been using silver alginate after putting antibiotic biotic ointment in one wound and watching it come out the other. I have talked to  her in some detail today. I had given her names of orthopedic surgeons at [facility] for second opinion on what to do about the right leg. I do not think the patient never called them. She has not been able to get a hold of the orthopedic surgeon in [place] that she had put a lot of faith in as being somebody would give her an opinion that she would trust. I talked to her today and said even if I could get her in to another orthopedic surgeon about the leg which she accept an amputation and she said she would not therefore I am not going to press this issue for the moment 12/14; continued improvement in his substantial wound on the right medial foot. There is still a necrotic area inferiorly with tightly adherent necrotic debris which I have been working on debriding each time she is here. She does not have an orthopedic appointment. Since last time she was here I looked over her cultures which were essentially MRSA on the foot wound and gram-negative rods in the abscess on the anterior leg. 12/21; continued improvement in the area on the  right medial foot. She is not up on this much and that is probably a good thing since I do not know it could support continuous ambulation. She has a small area on the right lateral leg which were remanence of the IandD's I did because of the abscess. I think she should probably have prophylactic antibiotics I am going to have to look this over to see if we can make an intelligent decision here. In the meantime her major wound is come down nicely. Necrotic area inferiorly is still there but looks a lot better 04/06/2019; she has had some improvement in the overall surface area on the right medial foot somewhat narrowedr both but somewhat longer. The areas on the right lateral leg which were initial IandD sites are superficial. Nothing is present on the right heel. We are using silver alginate to the wound areas 1/18; right medial foot somewhat smaller. Still a deep probing area in the most distal recess of the wound. She has nothing open on the right leg. She has a new wound on the plantar aspect of her left fourth toe which may have come from just pulling skin. The patient using Medihoney on the wound on her foot under silver alginate. I cannot discourage her from this 2/1; 2-week follow-up using silver alginate on the right foot and her left fourth toe. The area on the right dorsal foot is contracted although there is still the deep area in the most distal part of the wound but still has some probing depth. No overt infection 2/15; 2-week follow-up. She continues to have improvement in the surface area on the dorsal right foot. Even the tunneling area from last time is almost closed. The area that was on the plantar part of her left fourth toe over the PIP is indeed closed 3/1; 2-week follow-up. Continued improvement in surface area. The original divot that we have been debriding inferiorly I think has full epithelialization although the epithelialization is gone down into the wound with probably 4 mm  of depth. Even under intense illumination I am unable to see anything open here. The remanence of the wound in this area actually look quite healthy. We have been using silver alginate 3/15; 2-week follow-up. Unfortunately not as good today. She has a comma shaped wound on the dorsal foot however the upper part of this is  larger. Under illumination debris on the surface She also tells [place] that she was on her right leg 2 times in the last couple of weeks mostly to reach up for things above her head etc. She felt a sharp pain in the right leg which she thinks is somewhere from the ankle to the knee. The patient has neuropathy and is really uncertain. She cannot feel her foot so she does not think it was coming from there 3/29; 2-week follow-up. Her wound measures smaller. Surface of the wound appears reasonable. She is using silver alginate with underlying Medihoney. She has home health. X-rays I did of her tib-fib last time were negative although it did show arterial calcification 4/12; 2-week follow-up. Her wound measures smaller in length. Using manuka honey with silver alginate on top. She has home health. 4/26; 2-week follow-up. Her wound is smaller but still very adherent debris under illumination requiring debridement she has been using manuka honey with silver alginate. 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 Been using Hydrofera Blue. She is on suppressive Bactrim but apparently she has been taking it twice daily 6/7; I have not seen this wound and about 6 weeks. Since then she was up in [place]. By her own admission she was walking on the foot because she did not have a wheelchair. The wound is not nearly as healthy looking as it was the last time I saw this. We ordered different things for her but she only uses Medihoney and silver alginate. As far as I know she is on suppressive  trimethoprim sulfamethoxazole. She does not admit to any fever or chills. Her CBGs apparently are at baseline however she is saying that she feels some discomfort on the lateral part of her ankle I looked over her last inflammatory markers from the summer 2020 at which time she had a deeply necrotic infected wound in this area. On 11/10/2018 her sedimentation rate was 56 and C-reactive protein 9.9. This was 107 and 29 on 07/29/2018. 6/17; the patient had a necrotic wound the last time she was here on the right dorsal foot. After debridement I did a culture. This showed a very resistant ESBL Klebsiella as well as Enterococcus. Her x-ray of the foot which was done because of warmth and some discomfort showed bone destruction within the carpal bones involving the navicular acute cuboid lateral middle cuneiforms but essentially unchanged from her prior study which was done on 10/29/2018. The findings were felt to represent chronic osteomyelitis. We did inflammatory markers on her. Her white count was 5.25 sedimentation rate 16 and C-reactive protein at 11.1. Notable for the fact that in August 2020 her CRP was 9.9 and sedimentation rate 56. I have looked at her x-rays. It is true that the bone destruction is very impressive however the patient came into this clinic for the wound on her right foot with pieces of bone literally falling out anteriorly with purulent material. I am not exactly sure I could have expected anything different. She has not been systemically unwell no fever chills or blood sugars have been reasonable. 6/28; she arrives with a right heel closed. The substantial area on the right anterior foot looks healthy. Much better looking surface. I think we can change to [facility] seems to help this previously. She is getting her antibiotics at dialysis she should be just about finished 7/9; changed to  Hydrofera Blue last week. Surface wound looks satisfactory not much change in surface area  however. She is going to [place] state next week this is usually a difficult thing for this patient follow-up will be for 2 weeks. 7/23; using Hydrofera Blue. She returns from her trip and the wound looks surprisingly good. Usually when this patient goes on trips she comes back with a lot of problems with the wound. She is saying that she sometimes feels an episodic "crunching" feeling on the lateral part of the foot. She is neuropathic and not feeling pain but wonders whether this could be a neuropathic dysesthesia. 11/13/19-Patient returns after 3 weeks, the wound itself is stable and patient states that there is nothing new going on she is on some extra anxiety medications and is resisting the temptation to pick at the dry skin around the wound. 9/20; patient has not been here in over a month and I have not seen her in 2 months. The wound in terms of size I think is about the same. There is no exposed bone. She has a nonviable surface on this. She is supposed to be using [facility] however she is also been using some form of honey preparation as well as a silver-based dressings. I do not think she has any pattern to this. 10/4; 2-week follow-up. Patient has been using some form of spray which she says has honey and silver to purchase this online she has been covering it with gauze. In spite of this the wound actually looks quite good. The deeper divot distally appears to be close down. There is a rim of epithelialization. 10/18; 2-week follow-up. Patient has been using her Hydrofera Blue covered with her silver honey spray that she got online. 11/1; 2-week follow-up. She is using Hydrofera Blue with a silver honey spray. Wound bed is measuring smaller. She has noticed that her foot is warmer on the right. She is concerned about infection. For a long period of time I had her on prophylactic trimethoprim sulfamethoxazole DS 1 tablet daily. She is asking for this to be restarted. The patient is  walking on this foot because of repairs that are being done in a home her but her room is on the second floor she has to go up and down stairs. I have cautioned against this however as usual she will do exactly what she wants to do 11/15; 2-week follow-up. She uses Hydrofera Blue with a silver/honey spray which I have never heard of. I think her wound looks about the same. Some epithelialization. No evidence that this is infected. I think she is walking on this more than we agreed on. She is going on extensive vacation over Thanksgiving 12/3; 2-week follow-up. She is using Hydrofera Blue however over Thanksgiving she ran out of this and she is simply been using Medihoney. In spite of this her wound is smaller almost divided into 2 now. She traveled extensively over Thanksgiving and actually looks quite good in spite of this. Usually this is been a marker of problems for her 12/17; 2-week follow-up. She is using Hydrofera Blue. The wound is smaller. Debris on the surface of this is fibrinous. She is traveling to [place] over the holidays which never bodes well for her wounds. I think she is walking more on her feet then she is even willing to admit and she tells me she does walk 2/11; using Hydrofera Blue. Her wounds are contracting however she walks in the clinic with a history that she  has not been able to eat she has had vomiting. She also has right eye problems. Her blood sugar was 567. She had blood work done at dialysis and we called there to get her blood work although it still had not returned although they should have this by the end of the day we were told. She also stated that she was not sure what her blood sugar was as her glucose monitor was not working and not coming to tomorrow. She also for some reason does not think her insulin pump is working well. Her endocrinologist is Dr. Elayne Snare 06/03/2020 upon evaluation today patient actually appears to be doing excellent in regard to her  wound. In fact she tells me it was not even bleeding until she picked a dry piece of skin off while we were getting ready to come in and see her today. Fortunately there is no signs of active infection which is great news and overall very pleased. She is going to be seeing her neurologist sometime shortly I believe it might even be on Monday. Nonetheless there can proceed with the work-up as far as anything else going on with her eye the fact that her right eyelid is drooping. 3/25; right medial dorsal foot. She has superficial areas here that appear to be fully epithelialized she is using Medihoney and silver alginate and some combination. ooShe has a new area on the mid left foot at roughly the level of the fifth metatarsal base 4/84/8; right medial dorsal foot. Still superficial areas here. I cleaned up 1 of these today she has been using I think mostly Medihoney but I would like her to use silver alginate. The area on the mid part of her foot also looks improved on the left. Since she was last here she tells me that she noted pus in the area of her left foot. She told dialysis about this they did a culture and ultimately she is on IV antibiotics but were not sure which one. They referred her to [name] he said that he did not need to follow her. I have not really verified any of this and I do not know what antibiotic she is on at dialysis 4/29; patient has been using Medihoney to her wounds. She reports taking off the toenail to her left second toe. She states that home health discharged her and she has not had help with dressing changes. She denies any signs and symptoms of infection. 5/13; 2-week follow-up. Miraculously the wound on the right foot dorsally is healed. She has an area on the tip of her left third toe and an area in the left midfoot. 5/26; patient presents for 2-week follow-up. She has 2 open wounds 1 to her plantar foot and the other to the left third toe. She has noticed  increased swelling and redness to the toe wound. It is unclear exactly what she is doing for dressing changes as she has multiple dressing options at home. She states that it is easiest to do [facility] on the plantar wound and Prisma or Medihoney on the toe wound. 6/3; saw Dr. Maryjane Hurter week. She put her on Keflex because of the swelling I think of the left second toe. She arrives today with new breakdown x3 on the dorsal right foot which is disappointing. She also complains of pain in the right ankle, 3 liquid stools per day, chills without fever 6/17; patient was in [place]. I think a little more ambulatory than I would like  to hear. In any case she still has the 3 small open areas on the dorsal part of her foot on the right medial leg these look about the same as last time. She is supposed to be using Prisma but I think used Medihoney. She has an area on the left lateral plantar foot which had undermining under her skin and a blood blister with the 2 areas communicating today. And finally an area on the tip of her left second toe Objective Constitutional Patient is hypertensive.. Pulse regular and within target range for patient.Marland Kitchen Respirations regular, non-labored and within target range.. Temperature is normal and within the target range for the patient.Marland Kitchen Appears in no distress. Vitals Time Taken: 1:06 PM, Height: 67 in, Source: Stated, Weight: 125 lbs, Source: Stated, BMI: 19.6, Temperature: 99.1 F, Pulse: 80 bpm, Respiratory Rate: 18 breaths/min, Blood Pressure: 146/70 mmHg, Capillary Blood Glucose: 124 mg/dl. General Notes: glucose per pt report at present General Notes: Wound exam oo Left plantar foot communicating area. I used a #5 curette to remove skin callus and some subcutaneous tissue. Hemostasis with silver nitrate oo Area on the left second toe likely a pressure area. This did not require debridement. There is much less redness and erythema on the tip of the toe. oo 3 small  areas on the right medial foot dorsally these look somewhat dry but otherwise much the same as I remember. Integumentary (Hair, Skin) Wound #50 status is Open. Original cause of wound was Skin T ear/Laceration. The date acquired was: 06/24/2020. The wound has been in treatment 12 weeks. The wound is located on the [place]. The wound measures 0.3cm length x 0.3cm width x 0.3cm depth; 0.071cm^2 area and 0.021cm^3 volume. There is Fat Layer (Subcutaneous Tissue) exposed. Tunneling has been noted at 3:00 with a maximum distance of 1.6cm. Undermining begins at 12:00 and ends at 12:00 with a maximum distance of 0.3cm. There is a small amount of serosanguineous drainage noted. The wound margin is well defined and not attached to the wound base. There is large (67-100%) red granulation within the wound bed. There is no necrotic tissue within the wound bed. Wound #51 status is Open. Original cause of wound was Trauma. The date acquired was: 07/29/2020. The wound has been in treatment 7 weeks. The wound is located on the Left T Second. The wound measures 0.6cm length x 0.7cm width x 0.1cm depth; 0.33cm^2 area and 0.033cm^3 volume. There is Fat Layer oe (Subcutaneous Tissue) exposed. There is no tunneling or undermining noted. There is a small amount of serosanguineous drainage noted. The wound margin is distinct with the outline attached to the wound base. There is large (67-100%) red granulation within the wound bed. There is no necrotic tissue within the wound bed. Wound #52 status is Open. Original cause of wound was Gradually Appeared. The date acquired was: 09/02/2020. The wound has been in treatment 2 weeks. The wound is located on the Right Foot. The wound measures 1.5cm length x 1.5cm width x 0.1cm depth; 1.767cm^2 area and 0.177cm^3 volume. There is Fat Layer (Subcutaneous Tissue) exposed. There is no tunneling or undermining noted. There is a medium amount of serous drainage noted. The wound  margin is distinct with the outline attached to the wound base. There is small (1-33%) pink granulation within the wound bed. There is a large (67-100%) amount of necrotic tissue within the wound bed including Adherent Slough. 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 There was a Excisional Skin/Subcutaneous Tissue Debridement with a total area of 0.09 sq cm performed by Ricard Dillon., MD. With the following instrument(s): Curette to remove Non-Viable tissue/material. Material removed includes Subcutaneous Tissue and Skin: Dermis and. No specimens were taken. A time out was conducted at 13:29, prior to the start of the procedure. A Moderate amount of bleeding was controlled with Pressure. The procedure was tolerated well. Post Debridement Measurements: 1.5cm length x 1.5cm width x 0.3cm depth; 0.53cm^3 volume. Character of Wound/Ulcer Post Debridement is stable. Severity of Tissue Post Debridement is: Fat layer exposed. Post procedure Diagnosis Wound #50: Same as Pre-Procedure Plan Follow-up Appointments: Return Appointment in 2 weeks. - Dr. Dellia Nims Bathing/ Shower/ Hygiene: May shower and wash wound with soap and water. Edema Control - Lymphedema / SCD / Other: Elevate legs to the level of the heart or above for 30 minutes daily and/or when sitting, a frequency of: - throughout the day Avoid standing for long periods of time. Moisturize legs daily. - with dressing changes Off-Loading: Open toe surgical shoe to: - to  both feet Other: - minimal weight bearing right foot WOUND #50: - Foot Wound Laterality: Plantar, Left Cleanser: Wound Cleanser Every Other Day/30 Days Discharge Instructions: Cleanse the wound with wound cleanser or normal saline prior to applying a clean dressing using gauze sponges, not tissue or cotton balls. Prim Dressing: KerraCel Ag Gelling Fiber Dressing, 2x2 in (silver alginate) Every Other Day/30 Days ary Discharge Instructions: Apply silver alginate to wound bed as instructed Secondary Dressing: Woven Gauze Sponge, Non-Sterile 4x4 in Every Other Day/30 Days Discharge Instructions: Apply over primary dressing as directed. Secured With: Elastic Bandage 4 inch (ACE bandage) Every Other Day/30 Days Discharge Instructions: Secure with ACE bandage as directed. Secured With: [employer], 4.5x3.1 (in/yd) Every Other Day/30 Days Discharge Instructions: Secure with Kerlix as directed. Secured With: Paper T ape, 2x10 (in/yd) Every Other Day/30 Days Discharge Instructions: Secure dressing with tape as directed. WOUND #51: - T Second Wound Laterality: Left oe Cleanser: Soap and Water Every Other Day/15 Days Discharge Instructions: May shower and wash wound with dial antibacterial soap and water prior to dressing change. Prim Dressing: KerraCel Ag Gelling Fiber Dressing, 4x5 in (silver alginate) Every Other Day/15 Days ary Discharge Instructions: Apply silver alginate to wound bed as instructed Secondary Dressing: Woven Gauze Sponges 2x2 in Every Other Day/15 Days Discharge Instructions: Apply over primary dressing as directed. Secured With: Child psychotherapist, Sterile 2x75 (in/in) Every Other Day/15 Days Discharge Instructions: Secure with stretch gauze as directed. Secured With: Paper T ape, 1x10 (in/yd) Every Other Day/15 Days Discharge Instructions: Secure dressing with tape as directed. WOUND #52: - Foot Wound Laterality: Right Cleanser: Soap and Water Every  Other Day/15 Days Discharge Instructions: May shower and wash wound with dial antibacterial soap and water prior to dressing change. Prim Dressing: Promogran Prisma Matrix, 4.34 (sq in) (silver collagen) Every Other Day/15 Days ary Discharge Instructions: Moisten collagen with saline or hydrogel Secondary Dressing: Woven Gauze Sponges 2x2 in Every Other Day/15 Days Discharge Instructions: Apply over primary dressing as directed. Secured With: Patent attorney  Bandage 4 inch (ACE bandage) Every Other Day/15 Days Discharge Instructions: Secure with ACE bandage as directed. Secured With: Child psychotherapist, Sterile 2x75 (in/in) Every Other Day/15 Days Discharge Instructions: Secure with stretch gauze as directed. Secured With: Paper T ape, 1x10 (in/yd) Every Other Day/15 Days Discharge Instructions: Secure dressing with tape as directed. 1. She completed the antibiotic for the left second toe I think that can stop. 2. Silver alginate to the 2 areas on the left 3. Continue with moistened silver collagen to the right dorsal foot. 4. As usual she liberally substitutes Medihoney and I am not really sure how many days she uses white on white nevertheless everything looked reasonably stable today Electronic Signature(s) Signed: 09/16/2020 5:27:23 PM By: Linton Ham MD Entered By: Linton Ham on 09/16/2020 13:46:06 -------------------------------------------------------------------------------- SuperBill Details Patient Name: Date of Service: Nancy LLO [name] 09/16/2020 Medical Record Number: [id number] Patient Account Number: [id number] Date of Birth/Sex: Treating RN: Jun 06, 1970 (50 y.o. 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 Facility Procedures CPT4 Code: [id number] IC Description: 042 - DEB SUBQ TISSUE 20 SQ CM/< D-10 Diagnosis Description L97.521 Non-pressure chronic ulcer of other part of left foot limited to breakdown of ski Modifier: 1 n Quantity: Physician Procedures : CPT4 Code Description Modifier [id number] 11042 - WC PHYS SUBQ TISS 20 SQ CM ICD-10 Diagnosis Description L97.521 Non-pressure chronic ulcer of other part of left foot limited to breakdown of skin Quantity: 1 Electronic Signature(s) Signed: 09/16/2020 5:27:23 PM By: Linton Ham MD Signed: 09/16/2020 5:38:00 PM By: Lorrin Jackson Entered By: Lorrin Jackson on 09/16/2020 13:43:40

## 2020-09-20 ENCOUNTER — Telehealth: Payer: Self-pay

## 2020-09-20 NOTE — Telephone Encounter (Signed)
Received a fax for Rx refill on Dexcom supplies. Dr Dwyane Dee denied refill until patient schedules a follow up appointment. No recent chart notes to send with Medical Necessity Form. I called pt no answer. Left detailed vm and informed pt to call us to schedule an appt.

## 2020-09-28 NOTE — Telephone Encounter (Signed)
Received another application for dexcom supples. Pt needs to be seen by doctor before he can approve anymore supplies.

## 2020-09-29 ENCOUNTER — Other Ambulatory Visit: Payer: Self-pay

## 2020-09-29 ENCOUNTER — Encounter (HOSPITAL_BASED_OUTPATIENT_CLINIC_OR_DEPARTMENT_OTHER): Payer: Medicare HMO | Admitting: Internal Medicine

## 2020-09-29 DIAGNOSIS — E10621 Type 1 diabetes mellitus with foot ulcer: Secondary | ICD-10-CM | POA: Diagnosis not present

## 2020-09-29 NOTE — Progress Notes (Signed)
Nancy, [name] ([id number]) Visit Report for 09/29/2020 HPI Details Patient Name: Date of Service: Nancy LLO [name] 09/29/2020 1:30 PM Medical Record Number: [id number] Patient Account Number: [id number] Date of Birth/Sex: Treating RN: September 02, 1970 (50 y.o. [name] Primary Care Provider: [name], [name] Other Clinician: Referring Provider: Treating Provider/Extender: [name], [name] Weeks in Treatment: 92 History of Present Illness HPI Description: When2 year old diabetic who is known to have type 1 diabetes which is poorly controlled last hemoglobin A1c was 11%. She comes in with a ulcerated area on the left lateral foot which has been there for over 6 months. Was recently she has been treated by [name] of podiatry who saw her last on 05/28/2016. Review of his notes revealed that the patient had incision and drainage with placement of antibiotic beads to the left foot on 04/11/2016 for possible osteomyelitis of the cuboid bone. Over the last year she's had a history of amputation of the left fifth toe and a femoropopliteal popliteal bypass graft somewhere in April 2017. 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 His note [name] mentions that the patient has been referred to me for further wound care and possibly great candidate for hyperbaric oxygen therapy due to recurrent osteomyelitis. However we do not have any x-rays of biopsy reports confirming this. He has been on several antibiotics including Bactrim and most recently is on doxycycline for an MRSA. I understand, the patient was not a candidate for IV antibiotics as she has had previous PICC lines which resulted in blood clots in both arms. There was a x-ray report dated 04/04/2016 on [name] notes which showed evidence of fifth ray resection left foot with osteolytic changes noted to the fourth metatarsal and cuboid bone on the left. 06/13/2016 -- had a left foot x-ray which  showed no acute fracture or dislocation and no definite radiographic evidence of osteomyelitis. Advanced osteopenia was seen. 06/20/2016 -- she has noticed a new wound on the right plantar foot in the region where she had a callus before. 06/27/16- the patient did have her x-ray of the right foot which showed no findings to suggest osteomyelitis. She saw her endocrinologist, [name], yesterday. Her A1c in January was 11. He also indicates mismanagement and noncompliance regarding her diabetes. She is currently on Bactrim for a lip infection. She is complaining of nausea, vomiting and diarrhea. She is unable to articulate the exact orders or dosing of the Bactrim; it is unclear when she will complete this. 07/04/2016 -- results from [facility] of ABIs with ankle waveforms were noted from 02/14/2016. The examination done on 06/27/2015 showed noncompressible ABIs with the right being 1.45 and the left being 1.33. The present examination showed a right ABI of 1.19 on the left of 1.33. The conclusion was that right normal ABI in the lower extremity at rest however compared to previous study which was noncompressible ABI may be falsely elevated side suggesting medial calcification. The left ABI suggested medial calcification. 08/01/2016 -- the patient had more redness and pain on her right foot and did not get to come to see as noted she see her PCP or go to the ER and decided to take some leftover metronidazole which she had at home. As usual, the patient does report she feels and is rather noncompliant. 08/08/2016 -- -- x-ray of the right foot -- FINDINGS:Transmetatarsal amputation is noted. No bony destruction is noted to suggest osteomyelitis. IMPRESSION: No evidence of  osteomyelitis. Postsurgical changes are seen. MRI would be more sensitive for possible bony changes. Culture has grown Serratia Marcescens -- sensitive to Bactrim, ciprofloxacin, ceftazidime she was seen by [name] on  08/06/2016. He did not find any exposed bone, muscle, tendon, ligament or joint. There was no malodor and he did a excisional debridement in the office. ============ Old notes: 50 year old patient who is known to the wound clinic for a while had been away from the wound clinic since 09/01/2014. Over the last several months she has been admitted to various hospitals including [facility] [place]. She was treated for a right metatarsal osteomyelitis with a transmetatarsal amputation and this was done about 2 months ago. He has a small ulcerated area on the right heel and she continues to have an ulcerated area on the left plantar aspect of the foot. The patient was recently admitted to the [facility] between 7/12 and 10/18/2014. she was given 3 weeks of IV vancomycin and was to follow-up with her surgeons at [facility] and also took oral vancomycin for C. difficile colitis. 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 MRI of the left foot done on 10/14/2014 showed no abscess or osteomyelitis. 04/27/15; this is a patient we know from previous stays in the wound care center. She is a type I diabetic I am not sure of her control currently. Since the last time I saw her she is had a right transmetatarsal amputation and has no wounds on her right foot and has no open wounds. She is been followed at the wound care center at [facility] in [place]. She comes today with the desire to undergo hyperbaric treatment locally. Apparently one of her wound care providers in [place] has suggested hyperbarics. This is in response to an MRI from 04/18/15 that showed increased marrow signal and loss of the proximal  fifth metatarsal cortex evidence of osteomyelitis with likely early osteomyelitis in the cuboid bone as well. She has a large wound over the base of the fifth metatarsal. She also has a eschar over her the tips of her toes on 1,3 and 5. She does not have peripheral pulses and apparently is going for an angiogram tomorrow which seems reasonable. After this she is going to infectious disease at [facility]. They have been using Medihoney to the large wound on the lateral aspect of the left foot to. The patient has known Charcot deformity from diabetic neuropathy. She also has known diabetic PAD. Surprisingly I can't see that she has had any recent antibiotics, the patient states the last antibiotic she had was at the end of November for 10 days. I think this was in response to culture that showed group G strep although I'm not exactly sure where the culture was from. She is also had arterial studies on 03/29/15. This showed a right ABI of 1.4 that was noncompressible. Her left ABI was 0.73. 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 It was not felt that arterial inflow was adequate for healing of a foot ulcer. Her Doppler waveforms looked monophasic ===== READMISSION 02/28/17; this is in an now 50 year old woman we've had at several different occasions in this clinic. 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 She has  a remote ex-smoker. She was last seen in this clinic by Dr. Con Memos I think in May. More recently she is been followed by her podiatrist [name] an infectious disease Dr. Megan Salon. She has 2 open wounds the major one is over the right first metatarsal head she also has a wound on the left plantar foot. an MRI of the right foot on 01/01/17 showed a soft tissue ulcer along the plantar aspect of the first metatarsal base consistent with osteomyelitis of the first metatarsal stump. Dr. 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 According to the patient this is been open for slightly over a month. She has been on a combination of Cipro 500 twice a day, Zyvox 600 twice a day and Flagyl 500 3 times a day for over a month now as directed by Dr. Megan Salon. cultures of the right foot earlier this year showed MRSA in January and Serratia in May. January also had a few viridans strep. Recent x-rays of both feet were done and [name] office and I don't have these reports. The patient has known PAD and has a history of aleft femoropopliteal bypass in April 2017. 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 Dwyane Dee. In spite of this the last hemoglobin A1c I can see is 10.1 on 01/01/2017. She is being referred by 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 She is been using Medihoney to this area and also an area on the left midfoot. She is using healing sandals bilaterally. ABIs in this clinic at the left posterior tibial was 1.1 noncompressible on the right READMISSION Non invasive vascular NOVANT 5/18 Aftercare following surgery of the circulatory system Procedure Note - Interface, External Ris In - 08/13/2016 11:05 AM EDT Procedure: Examination consists of physiologic resting arterial pressures of the brachial and ankle arteries bilaterally with continuous wave Doppler waveform analysis. Previous: Previous exam performed on 02/14/16 demonstrated ABIs of Rt = 1.19 and Lt = 1.33. Right: ABI = non-compressible PT 1.47 DP. S/P transmet amputation. , Left: ABI = 1.52, 2nd digit pressure = 87 mmHg Conclusions: Right: ABI (>1.3) may be falsely elevated, suggesting medial calcification. Left: ABI (>1.3) may be falsely  elevated, suggesting medial calcification The patient is a now 50 year old type I diabetic is had multiple issues her graded to chronic diabetic foot ulcers. 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 We had her in the clinic lastin November. At that point she had wounds on her bilateral feet.she had wanted to try hyperbarics however the healogics review process denied her because she hadn't followed up with her vascular surgeon for her left femoropopliteal bypass. The bypass was done by Dr. Raul Del at [facility]. We made her a follow-up with Dr. Raul Del however she did not keep the appointment and therefore she was not approved The patient shows me a small wound on her left fourth metatarsal head on her phone. She developed rapid discoloration in the plantar aspect of the left foot and she was admitted to hospital from 2/2 through 05/10/17 with wet gangrene of the left foot osteomyelitis of the fourth metatarsal heads. She was admitted acutely ill with a temperature of 103.  She was started on broad-spectrum vancomycin and cefepime. On 05/06/17 she was taken to the OR by [name] her podiatric surgeon for an incision and drainage irrigation of the left foot wound. Cultures from this surgery revealed group be strep and anaerobes. she was seen by Dr.Xu of orthopedic surgery and scheduled for a below-knee amputation which she u refused. Ultimately she was discharged on Levaquin and Flagyl for one month. MRI 05/05/17 done while she was in the hospital showed abscess adjacent to the fourth metatarsal head and neck small abscess around the fourth flexor tendon. Inflammatory phlegmon and gas in the soft tissues along the lateral aspect of the fourth phalanx. Findings worrisome for osteomyelitis involving the fourth proximal and middle phalanx and also the third and fourth metatarsals. Finally the patient had actually shortly  before this followed up with Dr. Raul Del at no time on 04/29/17. He felt that her left femoropopliteal bypass was patent he felt that her left-sided toe pressures more than adequate for healing a wound on the left foot. This was before her acute presentation. 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 05/28/17; she is started hyperbarics. The patient tells me that for some reason she was not actually on Levaquin but I think on ciprofloxacin. She was on Flagyl. She only started her Levaquin yesterday due to some difficulty with the pharmacy and perhaps her sister picking it up. She has an appointment with [name] tomorrow and with infectious disease early next week. She has no new complaints 06/06/17; the patient continues in hyperbarics. She saw [name] on 05/29/17 who is her podiatric surgeon. He is elected for a transmetatarsal amputation on 06/27/17. I'm not sure at what level he plans to do this amputation. The patient is unaware She also saw Dr. Megan Salon of infectious disease who elected to continue her on current antibiotics I think this is ciprofloxacin and Flagyl. I'll need to clarify with her tomorrow if she actually has this. We're using silver alginate to the actual wound. Necrotic surface today with material under the flap of her foot. Original MRI showed abscesses as well as osteomyelitis of the proximal and middle fourth phalanx and the third and fourth metatarsal heads 06/11/17; patient continues in hyperbarics and continues on oral antibiotics. She is doing well. The wound looks better. The necrotic part of this under the flap in her superior foot also looks better. she is been to see [name]. I haven't had a chance to look at his note. Apparently he has put the transmetatarsal amputation on hold her request it is still planning to take her to the OR for debridement and product application ACEL. I'll see if I can find his note. I'll therefore leave product ordering/requests to [name]  for now. I was going to look at Dermagraft 06/18/17-she is here in follow-up evaluation for bilateral foot wounds. She continues with hyperbaric therapy. She states she has been applying manuka honey to the right plantar foot and alternate manuka honey and silver alginate to the left foot, despite our orders. We will continue with same treatment plan and she will follo up next week. 06/25/17; I have reviewed [name] last note from 3/11. She has operative debridement in 2 days' time. By review his note apparently they're going to place there is skin over the majority of this wound which is a good choice. She has a small satellite area at the most proximal part of this wound on the left plantar foot. The area on the right  plantar foot we've been using silver alginate and it is close to healing. 07/02/17; unfortunately the patient was not easily approved for [name] proposed surgery. I'm not completely certain what the issue is. She has been using silver alginate to the wound she has completed a first course of hyperbarics. She is still on Levaquin and Flagyl. I have really lost track of the time course here.I suspect she should have another week to 2 of antibiotics. I'll need to see if she is followed up with infectious disease Dr. Megan Salon 07/09/17; the patient is followed up with Dr. Megan Salon. 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 She continues on Levaquin and metronidazole continuing both of these for now I think she is been on fr about 6 weeks. She still has some drainage but no pain. No fever. Her had been plans for her to go to the OR for operative debridement with her podiatrist [name], I am not exactly sure where that is. I'll probably slip a note to [name] today. I note that she follows with Dr. Dwyane Dee of endocrinology. We have her recertified for hyperbaric oxygen. I have not heard about Dermagraft however I'll see if [name] is planning a  skin substitute as well 07/16/17; the patient tells me she is just about out of [place]. I'll need to check Dr. Hale Bogus last notes on this. She states she has plenty of Flagyl however. She comes in today complaining of pain in the right lateral foot which she said lasted for about a day. The wound on the right foot is actually much more medially. She also tells me that the [facility] cost a lot of pain in the left foot wound and she turned back to silver alginate. Finally Dermagraft has a $481 per application co-pay. She cannot afford this 07/23/17; patient arrives today with the wound not much smaller. There is not much new to add. She has not heard from [name] all try to put in a call to them today. She was asking about Dermagraft again and she has an over $856 per application co-pay she states that she would be willing to try to do a payment plan. I been tried to avoid this. We've been using silver alginate, I'll change to [facility] 07/30/17-She is here in follow-up evaluation for left foot ulcer. She continues hyperbaric medicine. The left foot ulcer is stable we will continue with same treatment plan 08/06/17; she is here for evaluation of her left foot ulcer. Currently being treated for hyperbarics or underlying osteomyelitis. She is completed antibiotics. The left foot ulcer is better smaller with healthier looking granulation. For various reasons I am not really clear on we never got her back to the OR with [name]. He did not respond to my secure text message. Nevertheless I think that surgery on this point is not necessary nor am I completely clear that a skin substitute is necessary The patient is complaining about pain on the outside of her right foot. 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 There is no erythema. She also states the foot is warm versus her other part of her upper leg and this is largely true. It is not totally clear to me what's causing this. She  thinks it's different from her usual neuropathy pain 08/13/17; she arrives in clinic today with a small wound which is superficial on her right first metatarsal head. 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 She tells [place] she was up on her feet over the Mother's Day celebration. The large wound is on the left foot. Continues with hyperbarics for underlying osteomyelitis. We're using Hydrofera Blue. She asked me today about where we were with Dermagraft. I had actually excluded this because of the co-pay however she wants to assume this therefore I'll recheck the co-pay an order for next week. 08/20/17; the patient agreed to accept the co-pay of the first Apligraf which we applied today. She is disappointed she is finishing hyperbarics will run this through the insurance on the extent of the foot infection and the extent of the wound that she had however she is already had 60 dive's. Dermagraft No. 1 08/27/17; Dermagraft No. 2. She is not eligible for any more hyperbaric treatments this month. She reports a fair amount of drainage and she actually changed to the external dressings without disturbing the direct contact layer 09/03/17; the patient arrived in clinic today with the wound superficially looking quite healthy. Nice vibrant red tissue with some advancing epithelialization although not as much adherence of the flap as I might like. However she noted on her own fourth toe some bogginess and she brought that to our attention. Indeed this was boggy feeling like a possibility of subcutaneous fluid. 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 She is not been unwell. We've been using Dermagraft 09/10/17; the culture that I did not last week was MRSA. She saw Dr. Megan Salon this morning who is going to start her on vancomycin. I had sent him a secure a text message yesterday. I also spoke with her podiatric surgeon  [name] about surgery on this foot the options for conserving a functional foot etc. Promised me he would see her and will make back consultation today. Paradoxically her actual wound on the plantar aspect of her left foot looks really quite good. I had given her 5 days worth of Baxdella to cover her for MRSA. Her MRI came back showing osteomyelitis within the third metatarsal shaft and head and base of the third and fourth proximal phalanx. She had extensive inflammatory changes throughout the soft tissue of the lateral forefoot. With an ill-defined fluid around the fourth metatarsal extending into the plantar and dorsal soft tissues 09/19/17; the patient is actually on oral Septra and Flagyl. She apparently refused IV vancomycin. She also saw [name] at my request who is planning her for a left BKA sometime in mid July. MRI showed osteomyelitis within the third metatarsal shaft and head and the basis of the third and fourth proximal phalanx. I believe there was felt to be possible septic arthritis involving the third MTP. 09/26/17; the patient went back to Dr. Megan Salon at my suggestion and is now receiving IV daptomycin. Her wound continues to look quite good making the decision to proceed with a transmetatarsal amputation although more difficult for the patient. I believe in my extensive discussions with her she has a good sense of the pros and cons of this. I don't NV the tuft decision she has to make. She has an appointment with [name] I believe in mid July and I previously spoken to him about this issue Has we had used 3 previous Dermagraft. Given the condition of the wound surface I went ahead and added the fourth one today area and I did this not fully realizing that she'll be traveling  to [place] next week. I'm hopeful she can come back in 2 weeks 10/21/17; Her same Dermagraft on for about 3-1/2 weeks. In spite of this the wound arrives looking quite healthy. There is been a lot of  healing dimensions are smaller. Looking at the square shaped wound she has now there is some undermining and some depth medially under the undermining although I cannot palpate any bone. No surrounding infection is obvious. She has difficult questions about how to look at this going forward vis--vis amputations versus continued medical therapy. T be truthful the wound is looks o so healthy and it is continued to contract. Hard to justify foot surgery at this point although I still told her that I think it might come to that if we are not able to eradicate the underlying MRSA. She is still highly at risk and she understands this 11/06/17 on evaluation today patient appears to be doing better in regard to her foot ulcer. She's been tolerating the dressing changes without complication. Currently she is here for her Dermagraft #6. Her wound continues to make excellent progress at this point. She does not appear to have any evidence of infection which is good news. 11/13/17 on evaluation today patient appears to be doing excellent at this time. She is here for repeat Dermagraft application. This is #7. Overall her wound seems to be making great progress. 12/05/17; the patient arrives with the wound in much better condition than when I last saw this almost 6 weeks ago. She still has a small probing area in the left metatarsal head region on the lateral aspect of her foot. We applied her last Dermagraft today. Since the last time she is here she has what appears to a been a blood blister on the plantar aspect of left foot although I don't see this is threatening. There is also a thick raised tissue on the right mid metatarsal head region. This was not there I don't think the last time she was here 3 weeks ago. 12/12/17; the patient continues to have a small programming area in the left metatarsal head region on the lateral aspect of her foot which was the initial large surgical wound. I applied her last Apligraf  last week. I'm going to use Endoform starting today Unfortunately she has an excoriated area in the left mid foot and the right mid foot. The left midfoot looks like a blistered area this was not opened last week it certainly is open today. Using silver alginate on these areas. She promises me she is offloading this. 12/19/17; the small probing area in the left metatarsal head eyes think is shallower. In general her original wound looks better. We've been using Endoform. The area inferiorly that I think was trauma last week still requires debridement a lot of nonviable surface which I removed. She still has an open open area distally in her foot Similarly on the right foot there is tightly adherent surface debris which I removed. Still areas that don't look completely epithelialized. This is a small open area. We used silver alginate on these areas 12/26/2017; the patient did not have the supplies we ordered from last week including the Endoform. The original large wound on the left lateral foot looks healthy. She still has the undermining area that is largely unchanged from last week. She has the same heavily callused raised edged wounds on the right mid and left midfoot. Both of these requiring debridement. We have been using silver alginate on these areas 01/02/2018;  there is still supply issues. We are going to try to use Prisma but I am not sure she actually got it from what she is saying. She has a new open area on the lateral aspect of the left fourth toe [previous fifth ray amputation]. Still the one tunneling area over the fourth metatarsal head. The area is in the midfoot bilaterally still have thick callus around them. She is concerned about a raised swelling on the lateral aspect of the foot. However she is completely insensate 01/10/2018; we are using Prisma to the wounds on her bilateral feet. Surprisingly the tunneling area over the left fourth metatarsal head that was part of  her original surgery has closed down. She has a small open area remaining on the incision line. 2 open areas in the midfoot. 02/10/2018; the patient arrives back in clinic after a month hiatus. She was traveling to visit family in [place]. Is fairly clear she was not offloading the areas on her feet. The original wound over the left lateral foot at the level of metatarsal heads is reopened and probes medially by about a centimeter or 2. She notes that a week ago she had purulent drainage come out of an area on the left midfoot. Paradoxically the worst area is actually on the right foot is extensive with purulent drainage. We will use silver alginate today 02/17/2018; the patient has 3 wounds one over the left lateral foot. She still has a small area over the metatarsal heads which is the remnant of her original surgical wound. This has medial probing depth of roughly 1.4 cm somewhat better than last week. The area on the right foot is larger. We have been using silver alginate to all areas. The area on the right foot and left foot that we cultured last week showed both Klebsiella and Proteus. Both of these are quinolone sensitive. The patient put her's self on Bactrim and Flagyl that she had left hanging around from prior antibiotic usages. She was apparently on this last week when she arrived. I did not realize this. Unfortunately the Bactrim will not cover either 1 of these organisms. We will send in Cipro 500 twice daily for a week 03/04/2018; the patient has 2 wounds on the left foot one is the original wound which was a surgical wound for a deep DFU. At one point this had exposed bone. She still has an area over the fourth metatarsal head that probes about 1.4 cm although I think this is better than last week. I been using silver nitrate to try and promote tissue adherence and been using silver alginate here. She also has an area in the left midfoot. This has some depth but a small linear wound.  Still requiring debridement. On the right midfoot is a circular wound. A lot of thick callus around this area. We have been using silver alginate to all wound areas She is completed the ciprofloxacin I gave her 2 weeks ago. 03/11/2018; the patient continues to have 2 open areas on the left foot 1 of which was the original surgical wound for a deep DFU. Only a small probing area remains although this is not much different from last week we have been using silver alginate. The other area is on the midfoot this is smaller linear but still with some depth. We have been using silver alginate here as well On the right foot she has a small circular wound in the mid aspect. This is not much smaller than last  time. We have been using silver alginate here as well 03/18/2018; she has 3 wounds on the left foot the original surgical wound, a very superficial wound in the mid aspect and then finally the area in the mid plantar foot. She arrives in today with a very concerning area in the wound in the mid plantar foot which is her most proximal wound. There is undermining here of roughly 1-1/2 cm superiorly. Serosanguineous drainage. She tells me she had some pain on for over the weekend that shot up her foot into her thigh and she tells me that she had a nodule in the groin area. She has the single wound in the right foot. We are using endoform to both wound areas 03/24/2018; the patient arrives with the original surgical wound in the area on the left midfoot about the same as last week. There is a collection of fluid under the surface of the skin extending from the surgical wound towards the midfoot although it does not reach the midfoot wound. The area on the right foot is about the same. Cultures from last week of the left midfoot wound showed abundant Klebsiella abundant Enterococcus faecalis and moderate methicillin resistant staph I gave her Levaquin but this would have only covered the Klebsiella. She  will need linezolid 04/01/2018; she is taking linezolid but for the first few days only took 1 a day. I have advised her to finish this at twice daily dosing. In any case all of her wounds are a lot better especially on the left foot. The original surgical wound is closed. The area on the left midfoot considerably smaller. The area on the right foot also smaller. 04/08/2018; her original surgical wound/osteomyelitis on the left foot remains closed. She has area on the left foot that is in the midfoot area but she had some streaking towards this. This is not connected with her original wound at least not visually. Small wound on the right midfoot appears somewhat smaller. 04/15/18; both wounds looks better. Original wound is better left midfoot. Using silver alginate 1/21; patient states she uses saltwater soak in, stones or remove callus from around her wounds. She is also concerned about a blood blister she had on the left foot but it simply resolved on its own. We've been using silver alginate 1/28; the patient arrives today with the same streaking area from her metatarsals laterally [the site of her original surgical wound] down to the middle of her foot. There is some drainage in the subcutaneous area here. This concerns me that there is actually continued ongoing infection in the metatarsals probably the fourth and third. This fixates an MRI of the foot without contrast [chronic renal failure] The wound in the mid part of the foot is small but I wonder whether this area actually connects with the more distal foot. The area on the right midfoot is probably about the same. Callus thick skin around the small wound which I removed with a curette we have been using silver alginate on both wound areas 2/4; culture I did of the draining site on the left foot last time grew methicillin sensitive staph aureus. MRI of the left foot showed interval resolution of the findings surrounding the third metatarsal  joint on the prior study consistent with treated osteomyelitis. Chronic soft tissue ulceration in the plantar and lateral aspect of the forefoot without residual focal fluid collection. No evidence of recurrent osteomyelitis. Noted to have the previous amputation of the distal first phalanx and fifth  ray MRI of the right foot showed no evidence of osteomyelitis I am going to treat the patient with a prolonged course of antibiotics directed against MSSA in the left foot 2/11; patient continues on cephalexin. She tells me she had nausea and vomiting over the weekend and missed 2 days. In general her foot looks much the same. She has a small open area just below the left fourth metatarsal head. A linear area in the left midfoot. Some discoloration extending from the inferior part of this into the left lateral foot although this appears to be superficial. She has a small area on the right midfoot which generally looks smaller after debridement 2/18; the patient is completing his cephalexin and has another 2 days. She continues to have open areas on the left and right foot. 2/25; she is now off antibiotics. The area on the left foot at the site of her original surgical wound has closed yet again. She still has open areas in the mid part of her foot however these appear smaller. The area on the right mid foot looks about the same. We have been using silver alginate She tells me she had a serious hypoglycemic spell at home. She had to have EMS called and get IV dextrose 3/3; disappointing on the left lateral foot large area of necrotic tissue surrounding the linear area. This appears to track up towards the same original surgical wound. Required extensive debridement. The area on the right plantar foot is not a lot better also using silver 3/12; the culture I did last time showed abundant enterococcus. I have prescribed Augmentin, should cover any unrecognized anaerobes as well. In addition there were a few  MRSA and Serratia that would not be well covered although I did not want to give her multiple antibiotics. She comes in today with a new wound in the right midfoot this is not connected with the original wound over her MTP a lot of thick callus tissue around both wounds but once again she said she is not walking on these areas 3/17-Patient comes in for follow-up on the bilateral plantar wounds, the right midfoot and the left plantar wound. Both these are heavily callused surrounding the wounds. We are continuing to use silver alginate, she is compliant with offloading and states she uses a wheelchair fairly often at home 3/24; both wound areas have thick callus. However things actually look quite a bit better here for the majority of her left foot and the right foot. 3/31; patient continues to have thick callused somewhat irritated looking tissue around the wounds which individually are fairly superficial. There is no evidence of surrounding infection. We have been using silver alginate however I change that to [facility] today 4/17; patient returns to clinic after having a scare with Covid she tested negative in her primary doctor's office. She has been using Hydrofera Blue. She does not have an open area on the right foot. On the left foot she has a small open area with the mid area not completely viable. She showed me pictures of what looks like a hemorrhagic blister from several days ago but that seems to have healed over this was on the lateral left foot 4/21; patient comes in to clinic with both her wounds on her feet closed. However over the weekend she started having pain in her right foot and leg up into the thigh. She felt as though she was running a low-grade fever but did not take her temperature. She took a  doxycycline that she had leftover and yesterday a single Septra and metronidazole. She thinks things feel somewhat better. 4/28; duplex ultrasound I ordered last week was negative  for DVT or superficial thrombophlebitis. She is completed the doxycycline I gave her. States she is still having a lot of pain in the right calf and right ankle which is no better than last week. She cannot sleep. She also states she has a temperature of up to 101, coughing and complaining of visual loss in her bilateral eyes. Apparently she was tested for Covid 2 weeks ago at [facility] and that was negative. Readmission: 09/03/18 patient presents back for reevaluation after having been evaluated at the end of April regarding erythema and swelling of her right lower extremity. Subsequently she ended up going to the hospital on 07/29/18 and was admitted not to be discharged until 08/08/18. Unfortunately it was noted during the time that she was in the hospital that she did have methicillin-resistant Staphylococcus aureus as the infection noted at the site. It was also determined that she did have osteomyelitis which appears to be fairly significant. She was treated with vancomycin and in fact is still on IV vancomycin at dialysis currently. This is actually slated to continue until 09/12/18 at least which will be the completion of the six weeks of therapy. Nonetheless based on what I'm seeing at this point I'm not sure she will be anywhere near ready to discontinue antibiotics at that time. Since she was released from the hospital she was seen by [name] who is her podiatrist on 08/27/18. His note specifically states that he is recommended that the patient needs of one knee amputation on the right as she has a life- threatening situation that can lead quickly to sepsis. The patient advised she would like to try to save her leg to which [name] apparently told her that this was against all medical advice. She also want to discontinue the Wound VAC which had been initiated due to the fact that she wasn't pleased with how the wound was looking and subsequently she wanted to pursue applying Medihoney at that time.  He stated that he did not believe that the right lower extremity was salvageable and that the patient understood but would still like to attempt hyperbaric option therapy if it could be of any benefit. She was therefore referred back to [place] for further evaluation. He plans to see her back next week. Upon inspection today patient has a significant amount purulent drainage noted from the wound at this point. The bone in the distal portion of her foot also appears to be extremely necrotic and spongy. When I push down on the bone it bubbles and seeps purulent drainage from deeper in the end of the foot. I do not think that this is likely going to heal very well at all and less aggressive surgical debridement were undertaken more than what I believe we can likely do here in our office. 09/12/2018; I have not seen this patient since the most recent hospitalization although she was in our clinic last week. I have reviewed some of her records from a complex hospitalization. 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 There is situation was complicated by MRSA bacteremia and acute on chronic renal failure now on dialysis. She is receiving vancomycin at dialysis. We started her on Dakin's wet-to-dry last week she is changing this daily. There is still purulent drainage coming out of her foot.  Although she is apparently "agreeable" to a below-knee amputation which is been suggested by multiple clinicians she wants this to be done in [place]. She apparently has a telehealth visit with that provider sometime in late Tye 6/24. I have told her I think this is probably too long. Nevertheless I could not convince her to allow a local doctor to perform BKA. 09/19/2018; the patient has a large necrotic area on the right anterior foot. She has had previous transmetatarsal amputations. Culture I did last week showed MRSA nothing else she is on vancomycin at dialysis. She  has continued leaking purulent drainage out of the distal part of the large circular wound on the right anterior foot. She apparently went to see Dr. 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 Somehow that translated into her being referred to plastic surgery for debridement of the area. I gather she basically refused amputation although I do not have a copy of Dr. Berenice Primas notes. The patient really wants to have a trial of hyperbaric oxygen. I agreed with initial assessment in this clinic that this was probably too far along to benefit however if she is going to have plastic surgery I think she would benefit from ancillary hyperbaric oxygen. The issue here is that the patient has benefited as maximally as any patient I have ever seen from hyperbaric oxygen therapy. Most recently she had exposed bone on the lateral part of her left foot after a surgical procedure and that actually has closed. She has eschared areas in both heels but no open area. She is remained systemically well. I am not optimistic that anything can be done about this but the patient is very clear that she wants an attempt. The attempt would include a wound VAC further debridements and hyperbaric oxygen along with IV antibiotics. 6/26; I put her in for a trial of hyperbaric oxygen only because of the dramatic response she has had with wounds on her left midfoot earlier this year which was a surgical wound that went straight to her bone over the metatarsal heads and also remotely the left third toe. We will see if we can get this through our review process and insurance. She arrives in clinic with again purulent material pouring out of necrotic bone on the top of the foot distally. There is also some concerning erythema on the front of the leg that we marked. It is bit difficult to tell how tender this is because of neuropathy. I note from infectious disease that she had her vancomycin extended. All the  cultures of these areas have shown MRSA sensitive to vancomycin. She had the wound VAC on for part of the week. The rest of the time she is putting various things on this including Medihoney, "ionized water" silver sorb gel etc. 7/7; follow-up along with HBO. She is still on vancomycin at dialysis. She has a large open area on the dorsal right foot and a small dark eschar area on her heel. There is a lot less erythema in the area and a lot less tenderness. From an infection point of view I think this is better. She still has a lot of necrosis in the remaining right forefoot [previous TMA] we are still using the wound VAC in this area 7/16; follow-up along with HBO. I put her on linezolid after she finished her vancomycin. We started this last Friday I gave her 2 weeks worth. I had the expectation that she would be operatively debrided by Dr. Marla Roe  but that still has not happened yet. Patient phoned the office this week. She arrives for review today after HBO. The distal part of this wound is completely necrotic. Nonviable pieces of tendon bone was still purulent drainage. Also concerning that she has black eschar over the heel that is expanding. I think this may be indicative of infection in this area as well. She has less erythema and warmth in the ankle and calf but still an abnormal exam 7/21 follow-up along with HBO. I will renew her linezolid after checking a CBC with differential monitoring her blood counts especially her platelets. She was supposed to have surgery yesterday but if I am reading things correctly this was canceled after her blood sugar was found to be over 500. I thought Dr. Marla Roe who called me said that they were sending her to the ER but the patient states that was not the case. 7/28. Follow-up along with HBO. She is on linezolid I still do not have any lab work from dialysis even though I called last week. The patient is concerned about an area on her left lateral  foot about the level of the base of her fifth metatarsal. I did not really see anything that ominous here however this patient is in [place] ability to point out problems that she is sensing and she has been accurate in the past Finally she received a call from Dr. Marla Roe who is referring her to another orthopedic surgeon stating that she is too booked up to take her to the operating room now. Was still using a wound VAC on the foot 8/3 -Follow-up after HBO, she is got another week of linezolid, she is to call ID for an appointment, x-rays of both feet were reviewed, the left foot x-ray with third MTP joint osteo- Right foot x-ray widespread osteo-in the right midfoot Right ankle x-ray does not show any active evidence of infection 8/11-Patient is seen after HBO, the wounds on the right foot appear to be about the same, the heel wound had some necrotic base over tendon that was debrided with a curette 8/21; patient is seen after HBO. The patient's wound on her dorsal foot actually looks reasonably good and there is substantial amount of epithelialization however the open area distally still has a lot of necrotic debris partially bone. I cannot really get a good sense of just how deep this probes under the foot. She has been pressuring me this week to order medical maggots through a company in [place] for her. The problem I have is there is not a defined wound area here. On the positive side there is no purulence. She has been to see infectious disease she is still on Septra DS although I have not had a chance to review their notes 8/28; patient is seen in conjunction with HBO. The wounds on her foot continued to improve including the right dorsal foot substantially the, the distal part of this wound and the area on the right heel. We have been using a wound VAC over this chronically. She is still on trimethoprim as directed by infectious disease 9/4; patient is seen in conjunction with HBO.  Right dorsal foot wound substantially anteriorly is better however she continues to have a deep wound in the distal part of this that is not responding. We have been using silver collagen under border foam Area on the right plantar medial heel seems better. We have been using Hydrofera Blue 12/12/18 on evaluation today patient appears to be  doing about the same with regard to her wound based on prior measurements. She does have some necrotic tissue noted on the lateral aspect of the wound that is going require a little bit of sharp debridement today. This includes what appears to be potentially either severely necrotic bone or tendon. Nonetheless other than that she does not appear to have any severe infection which is good news 9/18; it is been 2 weeks since I saw this wound. She is tolerating HBO well. Continued dramatic improvement in the area on the right dorsal foot. She still has a small wound on the heel that we have been using Hydrofera Blue. She continues with a wound VAC 9/24; patient has to be seen emergently today with a swelling on her right lateral lower leg. She says that she told Dr. Evette Doffing about this and also myself on a couple of occasions but I really have no recollection of this. She is not systemically unwell and her wound really looked good the last time I saw this. She showed this to providers at dialysis and she was able to verify that she was started on cephalexin today for 5 doses at dialysis. She dialyzes on Tuesday Thursday and Saturday. 10/2; patient is seen in conjunction with HBO. The area that is draining on the right anterior medial tibia is more extensive. Copious amounts of serosanguineous drainage with some purulence. We are still using the wound VAC on the original wound then it is stable. Culture I did of the original IandD showed MRSA I contacted dialysis she is now on vancomycin with dialysis treatments. I asked them to run a month 10/9; patient seen in  conjunction with HBO. She had a new spontaneous open area just above the wound on the right medial tibia ankle. More swelling on the right medial tibia. Her wound on the foot looks about the same perhaps slightly better. There is no warmth spreading up her leg but no obvious erythema. her MRI of the foot and ankle and distal tib-fib is not booked for next Friday I discussed this with her in great detail over multiple days. it is likely she has spreading infection upper leg at least involving the distal 25% above the ankle. She knows that if I refer her to orthopedics for infectious disease they are going to recommend amputation and indeed I am not against this myself. We had a good trial at trying to heal the foot which is what she wanted along with antibiotics debridement and HBO however she clearly has spreading infection [probably staph aureus/MRSA]. Nevertheless she once again tells me she wants to wait the left of the MRI. She still makes comments about having her amputation done in [place]. 10/19; arrives today with significant swelling on the lateral right leg. Last culture I did showed Klebsiella. Multidrug-resistant. Cipro was intermediate sensitivity and that is what I have her on pending her MRI which apparently is going to be done on Thursday this week although this seems to be moving back and forth. She is not systemically unwell. We are using silver alginate on her major wound area on the right medial foot and the draining areas on the right lateral lower leg 10/26; MRI showed extensive abscess in the anterior compartment of the right leg also widespread osteomyelitis involving osseous structures of the midfoot and portions of the hindfoot. Also suspicion for osteomyelitis anterior aspect of the distal medial malleolus. Culture I did of the purulence once again showed a multidrug-resistant Klebsiella. I have been  in contact with nephrology late last week and she has been started on  cefepime at dialysis to replace the vancomycin We sent a copy of her MRI report to Dr. Geroge Baseman in [place] who is an orthopedic surgeon. The patient takes great stock in his opinion on this. She says she will go to [place] to have her leg amputated if Dr. Geroge Baseman does not feel there is any salvage options. 11/2; she still is not talk to her orthopedic surgeon in [place]. Apparently he will call her at [phone] this afternoon. The quality of this is she has not allowed me to refer her anywhere. She has been told over and over that she needs this amputated but has not agreed to be referred. She tells me her blood sugar was 600 last night but she has not been febrile. 11/9; she never did got a call from the orthopedic surgeon in [place] therefore that is off the radar. We have arranged to get her see orthopedic surgery at [facility]. She still has a lot of draining purulence coming out of the new abscess in her right leg although that probably came from the osteomyelitis in her right foot and heel. Meanwhile the original wound on the right foot looks very healthy. Continued improvement. The issue is that the last MRI showed osteomyelitis in her right foot extensively she now has an abscess in the right anterior lower leg. There is nobody in [place] who will offer this woman anything but an amputation and to be honest that is probably what she needs. I think she still wants to talk about limb salvage although at this point I just do not see that. She has completed her vancomycin at dialysis which was for the original staph aureus she is still on cefepime for the more recent Klebsiella. She has had a long course of both of these antibiotics which should have benefited the osteomyelitis on the right foot as well as the abscess. 11/16; apparently [place] elective surgery is shut down because of COVID-19 pandemic. I have reached out to some contacts at [facility] to see if we can get her an  orthopedic appointment there. I am concerned about continually leaving this but for the moment everything is static. In fact her original large wound on this foot is closing down. It is the abscess on the right anterior leg that continues to drain purulent serosanguineous material. She is not currently on any antibiotics however she had a prolonged course of vancomycin [1 month] as well as cefepime for a month 02/24/2019 on evaluation today patient appears to be doing better than the last time I saw her. This is not a patient that I typically see. With that being said I am covering for Dr. Dellia Nims this week and again compared to when I last saw her overall the wounds in particular seem to be doing significantly better which is good news. With that being said the patient tells me several disconcerting things. She has not been able to get in to see anyone for potential debridement in regard to her leg wounds although she tells me that she does not think it is necessary any longer because she is taking care of that herself. She noticed a string coming out of the lower wound on her leg over the last week. The patient states that she subsequently decided that we must of pack something in there and started pulling the string out and as it kept coming and coming she realized this was likely her  tendon. With that being said she continued to remove as much of this as she could. She then I subsequently proceeded to using tubes of antibiotic ointment which she will stick down into the wound and then scored as much as she can until she sees it coming out of the other wound opening. She states that in doing this she is actually made things better and there is less redness and irritation. With regard to her foot wound she does have some necrotic tendon and tissue noted in one small corner but again the actual wound itself seems to be doing better with good granulation in general compared to my last evaluation. 12/7;  continued improvement in the wound on the substantial part of the right medial foot. Still a necrotic area inferiorly that required debridement but the rest of this looks very healthy and is contracting. She has 2 wounds on the right lateral leg which were her original drainage sites from her abscess but all of this looks a lot better as well. She has been using silver alginate after putting antibiotic biotic ointment in one wound and watching it come out the other. I have talked to her in some detail today. I had given her names of orthopedic surgeons at [facility] for second opinion on what to do about the right leg. I do not think the patient never called them. She has not been able to get a hold of the orthopedic surgeon in [place] that she had put a lot of faith in as being somebody would give her an opinion that she would trust. I talked to her today and said even if I could get her in to another orthopedic surgeon about the leg which she accept an amputation and she said she would not therefore I am not going to press this issue for the moment 12/14; continued improvement in his substantial wound on the right medial foot. There is still a necrotic area inferiorly with tightly adherent necrotic debris which I have been working on debriding each time she is here. She does not have an orthopedic appointment. Since last time she was here I looked over her cultures which were essentially MRSA on the foot wound and gram-negative rods in the abscess on the anterior leg. 12/21; continued improvement in the area on the right medial foot. She is not up on this much and that is probably a good thing since I do not know it could support continuous ambulation. She has a small area on the right lateral leg which were remanence of the IandD's I did because of the abscess. I think she should probably have prophylactic antibiotics I am going to have to look this over to see if we can make an intelligent  decision here. In the meantime her major wound is come down nicely. Necrotic area inferiorly is still there but looks a lot better 04/06/2019; she has had some improvement in the overall surface area on the right medial foot somewhat narrowedr both but somewhat longer. The areas on the right lateral leg which were initial IandD sites are superficial. Nothing is present on the right heel. We are using silver alginate to the wound areas 1/18; right medial foot somewhat smaller. Still a deep probing area in the most distal recess of the wound. She has nothing open on the right leg. She has a new wound on the plantar aspect of her left fourth toe which may have come from just pulling skin. The patient using Medihoney on  the wound on her foot under silver alginate. I cannot discourage her from this 2/1; 2-week follow-up using silver alginate on the right foot and her left fourth toe. The area on the right dorsal foot is contracted although there is still the deep area in the most distal part of the wound but still has some probing depth. No overt infection 2/15; 2-week follow-up. She continues to have improvement in the surface area on the dorsal right foot. Even the tunneling area from last time is almost closed. The area that was on the plantar part of her left fourth toe over the PIP is indeed closed 3/1; 2-week follow-up. Continued improvement in surface area. The original divot that we have been debriding inferiorly I think has full epithelialization although the epithelialization is gone down into the wound with probably 4 mm of depth. Even under intense illumination I am unable to see anything open here. The remanence of the wound in this area actually look quite healthy. We have been using silver alginate 3/15; 2-week follow-up. Unfortunately not as good today. She has a comma shaped wound on the dorsal foot however the upper part of this is larger. Under illumination debris on the surface She also  tells [place] that she was on her right leg 2 times in the last couple of weeks mostly to reach up for things above her head etc. She felt a sharp pain in the right leg which she thinks is somewhere from the ankle to the knee. The patient has neuropathy and is really uncertain. She cannot feel her foot so she does not think it was coming from there 3/29; 2-week follow-up. Her wound measures smaller. Surface of the wound appears reasonable. She is using silver alginate with underlying Medihoney. She has home health. X-rays I did of her tib-fib last time were negative although it did show arterial calcification 4/12; 2-week follow-up. Her wound measures smaller in length. Using manuka honey with silver alginate on top. She has home health. 4/26; 2-week follow-up. Her wound is smaller but still very adherent debris under illumination requiring debridement she has been using manuka honey with silver alginate. 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 Been using Hydrofera Blue. She is on suppressive Bactrim but apparently she has been taking it twice daily 6/7; I have not seen this wound and about 6 weeks. Since then she was up in [place]. By her own admission she was walking on the foot because she did not have a wheelchair. The wound is not nearly as healthy looking as it was the last time I saw this. We ordered different things for her but she only uses Medihoney and silver alginate. As far as I know she is on suppressive trimethoprim sulfamethoxazole. She does not admit to any fever or chills. Her CBGs apparently are at baseline however she is saying that she feels some discomfort on the lateral part of her ankle I looked over her last inflammatory markers from the summer 2020 at which time she had a deeply necrotic infected wound in this area. On 11/10/2018 her sedimentation rate was 56 and C-reactive  protein 9.9. This was 107 and 29 on 07/29/2018. 6/17; the patient had a necrotic wound the last time she was here on the right dorsal foot. After debridement I did a culture. This showed a very resistant ESBL Klebsiella as well as Enterococcus. Her  x-ray of the foot which was done because of warmth and some discomfort showed bone destruction within the carpal bones involving the navicular acute cuboid lateral middle cuneiforms but essentially unchanged from her prior study which was done on 10/29/2018. The findings were felt to represent chronic osteomyelitis. We did inflammatory markers on her. Her white count was 5.25 sedimentation rate 16 and C-reactive protein at 11.1. Notable for the fact that in August 2020 her CRP was 9.9 and sedimentation rate 56. I have looked at her x-rays. It is true that the bone destruction is very impressive however the patient came into this clinic for the wound on her right foot with pieces of bone literally falling out anteriorly with purulent material. I am not exactly sure I could have expected anything different. She has not been systemically unwell no fever chills or blood sugars have been reasonable. 6/28; she arrives with a right heel closed. The substantial area on the right anterior foot looks healthy. Much better looking surface. I think we can change to [facility] seems to help this previously. She is getting her antibiotics at dialysis she should be just about finished 7/9; changed to [facility] last week. Surface wound looks satisfactory not much change in surface area however. She is going to [place] state next week this is usually a difficult thing for this patient follow-up will be for 2 weeks. 7/23; using Hydrofera Blue. She returns from her trip and the wound looks surprisingly good. Usually when this patient goes on trips she comes back with a lot of problems with the wound. She is saying that she sometimes feels an episodic "crunching"  feeling on the lateral part of the foot. She is neuropathic and not feeling pain but wonders whether this could be a neuropathic dysesthesia. 11/13/19-Patient returns after 3 weeks, the wound itself is stable and patient states that there is nothing new going on she is on some extra anxiety medications and is resisting the temptation to pick at the dry skin around the wound. 9/20; patient has not been here in over a month and I have not seen her in 2 months. The wound in terms of size I think is about the same. There is no exposed bone. She has a nonviable surface on this. She is supposed to be using [facility] however she is also been using some form of honey preparation as well as a silver-based dressings. I do not think she has any pattern to this. 10/4; 2-week follow-up. Patient has been using some form of spray which she says has honey and silver to purchase this online she has been covering it with gauze. In spite of this the wound actually looks quite good. The deeper divot distally appears to be close down. There is a rim of epithelialization. 10/18; 2-week follow-up. Patient has been using her Hydrofera Blue covered with her silver honey spray that she got online. 11/1; 2-week follow-up. She is using Hydrofera Blue with a silver honey spray. Wound bed is measuring smaller. She has noticed that her foot is warmer on the right. She is concerned about infection. For a long period of time I had her on prophylactic trimethoprim sulfamethoxazole DS 1 tablet daily. She is asking for this to be restarted. The patient is walking on this foot because of repairs that are being done in a home her but her room is on the second floor she has to go up and down stairs. I have cautioned against this however as  usual she will do exactly what she wants to do 11/15; 2-week follow-up. She uses Hydrofera Blue with a silver/honey spray which I have never heard of. I think her wound looks about the same.  Some epithelialization. No evidence that this is infected. I think she is walking on this more than we agreed on. She is going on extensive vacation over Thanksgiving 12/3; 2-week follow-up. She is using Hydrofera Blue however over Thanksgiving she ran out of this and she is simply been using Medihoney. In spite of this her wound is smaller almost divided into 2 now. She traveled extensively over Thanksgiving and actually looks quite good in spite of this. Usually this is been a marker of problems for her 12/17; 2-week follow-up. She is using Hydrofera Blue. The wound is smaller. Debris on the surface of this is fibrinous. She is traveling to [place] over the holidays which never bodes well for her wounds. I think she is walking more on her feet then she is even willing to admit and she tells me she does walk 2/11; using Hydrofera Blue. Her wounds are contracting however she walks in the clinic with a history that she has not been able to eat she has had vomiting. She also has right eye problems. Her blood sugar was 567. She had blood work done at dialysis and we called there to get her blood work although it still had not returned although they should have this by the end of the day we were told. She also stated that she was not sure what her blood sugar was as her glucose monitor was not working and not coming to tomorrow. She also for some reason does not think her insulin pump is working well. Her endocrinologist is Dr. Elayne Snare 06/03/2020 upon evaluation today patient actually appears to be doing excellent in regard to her wound. In fact she tells me it was not even bleeding until she picked a dry piece of skin off while we were getting ready to come in and see her today. Fortunately there is no signs of active infection which is great news and overall very pleased. She is going to be seeing her neurologist sometime shortly I believe it might even be on Monday. Nonetheless there can proceed  with the work-up as far as anything else going on with her eye the fact that her right eyelid is drooping. 3/25; right medial dorsal foot. She has superficial areas here that appear to be fully epithelialized she is using Medihoney and silver alginate and some combination. She has a new area on the mid left foot at roughly the level of the fifth metatarsal base 4/84/8; right medial dorsal foot. Still superficial areas here. I cleaned up 1 of these today she has been using I think mostly Medihoney but I would like her to use silver alginate. The area on the mid part of her foot also looks improved on the left. Since she was last here she tells me that she noted pus in the area of her left foot. She told dialysis about this they did a culture and ultimately she is on IV antibiotics but were not sure which one. They referred her to [name] he said that he did not need to follow her. I have not really verified any of this and I do not know what antibiotic she is on at dialysis 4/29; patient has been using Medihoney to her wounds. She reports taking off the toenail to her left second toe.  She states that home health discharged her and she has not had help with dressing changes. She denies any signs and symptoms of infection. 5/13; 2-week follow-up. Miraculously the wound on the right foot dorsally is healed. She has an area on the tip of her left third toe and an area in the left midfoot. 5/26; patient presents for 2-week follow-up. She has 2 open wounds 1 to her plantar foot and the other to the left third toe. She has noticed increased swelling and redness to the toe wound. It is unclear exactly what she is doing for dressing changes as she has multiple dressing options at home. She states that it is easiest to do [facility] on the plantar wound and Prisma or Medihoney on the toe wound. 6/3; saw Dr. Maryjane Hurter week. She put her on Keflex because of the swelling I think of the left second toe. She  arrives today with new breakdown x3 on the dorsal right foot which is disappointing. She also complains of pain in the right ankle, 3 liquid stools per day, chills without fever 6/17; patient was in [place]. I think a little more ambulatory than I would like to hear. In any case she still has the 3 small open areas on the dorsal part of her foot on the right medial leg these look about the same as last time. She is supposed to be using Prisma but I think used Medihoney. She has an area on the left lateral plantar foot which had undermining under her skin and a blood blister with the 2 areas communicating today. And finally an area on the tip of her left second toe 6/30; patient has 3 small areas that look better than the last time on the right dorsal foot. She uses a combination of her products including collagen, Hydrofera Blue and the Medihoney nevertheless they look better. The area on the left midfoot might have been healed although she pulled some skin off superficial open area. The left second toe still does not look that healthy. Open areas on the tips of the Electronic Signature(s) Signed: 09/29/2020 5:19:12 PM By: Linton Ham MD Entered By: Linton Ham on 09/29/2020 14:47:07 -------------------------------------------------------------------------------- Physical Exam Details Patient Name: Date of Service: Nancy [name] 09/29/2020 1:30 PM Medical Record Number: [id number] Patient Account Number: [id number] Date of Birth/Sex: Treating RN: Sep 12, 1970 (50 y.o. [name] Primary Care Provider: [name], [name] Other Clinician: Referring Provider: Treating Provider/Extender: [name], [name] Weeks in Treatment: 73 Constitutional Patient is hypertensive.. Pulse regular and within target range for patient.Marland Kitchen Respirations regular, non-labored and within target range.. Temperature is normal and within the target range for the patient.Marland Kitchen Appears in no  distress. Notes Wound exam Left plantar foot wound is much more superficial this is in the mid part of her foot. This does not need any debridement better than last time She has 3 small areas on the right medial foot these are better and look like they are improved Tip of the left second toe still does not look Healthy to me. Electronic Signature(s) Signed: 09/29/2020 5:19:12 PM By: Linton Ham MD Entered By: Linton Ham on 09/29/2020 14:48:21 -------------------------------------------------------------------------------- Physician Orders Details Patient Name: Date of Service: Nancy [name] 09/29/2020 1:30 PM Medical Record Number: [id number] Patient Account Number: [id number] Date of Birth/Sex: Treating RN: Aug 22, 1970 (50 y.o. [name] Primary Care Provider: [name], [name] Other Clinician: Referring Provider: Treating Provider/Extender: Dellia Nims  Legrand Como MO REIRA, [name] Weeks in Treatment: 108 Verbal / Phone Orders: No Diagnosis Coding ICD-10 Coding Code Description L97.514 Non-pressure chronic ulcer of other part of right foot with necrosis of bone E10.621 Type 1 diabetes mellitus with foot ulcer L97.521 Non-pressure chronic ulcer of other part of left foot limited to breakdown of skin L97.529 Non-pressure chronic ulcer of other part of left foot with unspecified severity Follow-up Appointments ppointment in 2 weeks. - Dr. Dellia Nims Return A Bathing/ Shower/ Hygiene May shower and wash wound with soap and water. Edema Control - Lymphedema / SCD / Other Bilateral Lower Extremities Elevate legs to the level of the heart or above for 30 minutes daily and/or when sitting, a frequency of: - throughout the day Avoid standing for long periods of time. Moisturize legs daily. - with dressing changes Off-Loading Open toe surgical [name] to: - to both feet Other: - minimal weight bearing right foot Wound Treatment Wound #50 - Foot Wound Laterality: Plantar,  Left Cleanser: Wound Cleanser Every Other Day/30 Days Discharge Instructions: Cleanse the wound with wound cleanser or normal saline prior to applying a clean dressing using gauze sponges, not tissue or cotton balls. Prim Dressing: KerraCel Ag Gelling Fiber Dressing, 2x2 in (silver alginate) Every Other Day/30 Days ary Discharge Instructions: Apply silver alginate to wound bed as instructed Secondary Dressing: Woven Gauze Sponge, Non-Sterile 4x4 in Every Other Day/30 Days Discharge Instructions: Apply over primary dressing as directed. Secured With: Elastic Bandage 4 inch (ACE bandage) Every Other Day/30 Days Discharge Instructions: Secure with ACE bandage as directed. Secured With: [employer], 4.5x3.1 (in/yd) Every Other Day/30 Days Discharge Instructions: Secure with Kerlix as directed. Secured With: Paper Tape, 2x10 (in/yd) Every Other Day/30 Days Discharge Instructions: Secure dressing with tape as directed. Wound #51 - T Second oe Wound Laterality: Left Cleanser: Soap and Water Every Other Day/15 Days Discharge Instructions: May shower and wash wound with dial antibacterial soap and water prior to dressing change. Prim Dressing: KerraCel Ag Gelling Fiber Dressing, 4x5 in (silver alginate) Every Other Day/15 Days ary Discharge Instructions: Apply silver alginate to wound bed as instructed Secondary Dressing: Woven Gauze Sponges 2x2 in Every Other Day/15 Days Discharge Instructions: Apply over primary dressing as directed. Secured With: Child psychotherapist, Sterile 2x75 (in/in) Every Other Day/15 Days Discharge Instructions: Secure with stretch gauze as directed. Secured With: Paper Tape, 1x10 (in/yd) Every Other Day/15 Days Discharge Instructions: Secure dressing with tape as directed. Wound #52 - Foot Wound Laterality: Right Cleanser: Soap and Water Every Other Day/15 Days Discharge Instructions: May shower and wash wound with dial antibacterial soap and water  prior to dressing change. Prim Dressing: Promogran Prisma Matrix, 4.34 (sq in) (silver collagen) Every Other Day/15 Days ary Discharge Instructions: Moisten collagen with saline or hydrogel Secondary Dressing: Woven Gauze Sponges 2x2 in Every Other Day/15 Days Discharge Instructions: Apply over primary dressing as directed. Secured With: Elastic Bandage 4 inch (ACE bandage) Every Other Day/15 Days Discharge Instructions: Secure with ACE bandage as directed. Secured With: Child psychotherapist, Sterile 2x75 (in/in) Every Other Day/15 Days Discharge Instructions: Secure with stretch gauze as directed. Secured With: Paper Tape, 1x10 (in/yd) Every Other Day/15 Days Discharge Instructions: Secure dressing with tape as directed. Electronic Signature(s) Signed: 09/29/2020 5:19:12 PM By: Linton Ham MD Signed: 09/29/2020 6:57:58 PM By: Deon Pilling Entered By: Deon Pilling on 09/29/2020 14:31:35 -------------------------------------------------------------------------------- Problem List Details Patient Name: Date of Service: Nancy LLO WA Y, [name] 09/29/2020 1:30 PM Medical  Record Number: [id number] Patient Account Number: [id number] Date of Birth/Sex: Treating RN: 10-08-1970 (50 y.o. [name], [name] Primary Care Provider: [name], [name] Other Clinician: Referring Provider: Treating Provider/Extender: [name], [name] Weeks in Treatment: 108 Active Problems ICD-10 Encounter Code Description Active Date MDM Diagnosis L97.514 Non-pressure chronic ulcer of other part of right foot with necrosis of bone 09/03/2018 No Yes E10.621 Type 1 diabetes mellitus with foot ulcer 09/24/2018 No Yes L97.521 Non-pressure chronic ulcer of other part of left foot limited to breakdown of 06/24/2020 No Yes skin L97.529 Non-pressure chronic ulcer of other part of left foot with unspecified severity 07/29/2020 No Yes Inactive Problems ICD-10 Code Description Active Date  Inactive Date M86.671 Other chronic osteomyelitis, right ankle and foot 09/03/2018 09/03/2018 L97.411 Non-pressure chronic ulcer of right heel and midfoot limited to breakdown of skin 09/17/2019 09/17/2019 L97.521 Non-pressure chronic ulcer of other part of left foot limited to breakdown of skin 04/20/2019 04/20/2019 L97.812 Non-pressure chronic ulcer of other part of right lower leg with fat layer exposed 02/24/2019 02/24/2019 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 09/29/2020 1:30 PM Medical Record Number: [id number] Patient Account Number: [id number] Date of Birth/Sex: Treating RN: 20-May-1970 (50 y.o. [name] Primary Care Provider: [name], [name] Other Clinician: Referring Provider: Treating Provider/Extender: [name], [name] Weeks in Treatment: 108 Subjective History of Present Illness (HPI) When50 year old diabetic who is known to have type 1 diabetes which is poorly controlled last hemoglobin A1c was 11%. She comes in with a ulcerated area on the left lateral foot which has been there for over 6 months. Was recently she has been treated by [name] of podiatry who saw her last on 05/28/2016. Review of his notes revealed that the patient had incision and drainage with placement of antibiotic beads to the left foot on 04/11/2016 for possible osteomyelitis of the cuboid bone. Over the last year she's had a history of amputation of the left fifth toe and a femoropopliteal popliteal bypass graft somewhere in April 2017. 2  years ago she's had a right transmetatarsal amputation. His note [name] mentions that the patient has been referred to me for further wound care and possibly great candidate for hyperbaric oxygen therapy due to recurrent osteomyelitis. However we do not have any x-rays of biopsy reports confirming this. He has been on several antibiotics including Bactrim and most recently is on doxycycline for an MRSA. I understand, the patient was not a candidate for IV antibiotics as she has had previous PICC lines which resulted in blood clots in both arms. There was a x-ray report dated 04/04/2016 on [name] notes which showed evidence of fifth ray resection left foot with osteolytic changes noted to the fourth metatarsal and cuboid bone on the left. 06/13/2016 -- had a left foot x-ray which showed no acute fracture or dislocation and no definite radiographic evidence of osteomyelitis. Advanced osteopenia was seen. 06/20/2016 -- she has noticed a new wound on the right plantar foot in the region where she had a callus before. 06/27/16- the patient did have her x-ray of the right foot which showed no findings to suggest osteomyelitis. She saw  her endocrinologist, [name], yesterday. Her A1c in January was 11. He also indicates mismanagement and noncompliance regarding her diabetes. She is currently on Bactrim for a lip infection. She is complaining of nausea, vomiting and diarrhea. She is unable to articulate the exact orders or dosing of the Bactrim; it is unclear when she will complete this. 07/04/2016 -- results from [facility] of ABIs with ankle waveforms were noted from 02/14/2016. The examination done on 06/27/2015 showed noncompressible ABIs with the right being 1.45 and the left being 1.33. The present examination showed a right ABI of 1.19 on the left of 1.33. The conclusion was that right normal ABI in the lower extremity at rest however compared to previous study which was noncompressible ABI  may be falsely elevated side suggesting medial calcification. The left ABI suggested medial calcification. 08/01/2016 -- the patient had more redness and pain on her right foot and did not get to come to see as noted she see her PCP or go to the ER and decided to take some leftover metronidazole which she had at home. As usual, the patient does report she feels and is rather noncompliant. 08/08/2016 -- -- x-ray of the right foot -- FINDINGS:Transmetatarsal amputation is noted. No bony destruction is noted to suggest osteomyelitis. IMPRESSION: No evidence of osteomyelitis. Postsurgical changes are seen. MRI would be more sensitive for possible bony changes. Culture has grown Serratia Marcescens -- sensitive to Bactrim, ciprofloxacin, ceftazidime she was seen by [name] on 08/06/2016. He did not find any exposed bone, muscle, tendon, ligament or joint. There was no malodor and he did a excisional debridement in the office. ============ Old notes: 50 year old patient who is known to the wound clinic for a while had been away from the wound clinic since 09/01/2014. Over the last several months she has been admitted to various hospitals including [facility] [place]. She was treated for a right metatarsal osteomyelitis with a transmetatarsal amputation and this was done about 2 months ago. He has a small ulcerated area on the right heel and she continues to have an ulcerated area on the left plantar aspect of the foot. The patient was recently admitted to the [facility] hospital group between 7/12 and 10/18/2014. she was given 3 weeks of IV vancomycin and was to follow-up with her surgeons at [facility] and also took oral vancomycin for C. difficile colitis. 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 MRI of the left foot done on 10/14/2014 showed no abscess or osteomyelitis. 04/27/15; this is a patient we know from previous stays in the wound care center. She is a type I diabetic I am not sure of her control currently. Since the last time I saw her she is had a right transmetatarsal amputation and has no wounds on her right foot and has no open wounds. She is been followed at the wound care center at [facility] in [place]. She comes today with the desire to undergo hyperbaric treatment locally. Apparently one of her wound care providers in [place] has suggested hyperbarics. This is in response to an MRI from 04/18/15 that showed increased marrow signal and loss of the proximal fifth metatarsal cortex evidence of osteomyelitis with likely early osteomyelitis in the cuboid bone as well. She has a large wound over  the base of the fifth metatarsal. She also has a eschar over her the tips of her toes on 1,3 and 5. She does not have peripheral pulses and apparently is going for an angiogram tomorrow which seems reasonable. After this she is going to infectious disease at [facility]. They have been using Medihoney to the large wound on the lateral aspect of the left foot to. The patient has known Charcot deformity from diabetic neuropathy. She also has known diabetic PAD. Surprisingly I can't see that she has had any recent antibiotics, the patient states the last antibiotic she had was at the end of November for 10 days. I think this was in response to culture that showed group G strep although I'm not exactly sure where the culture was from. She is also had arterial studies on 03/29/15. This showed a right ABI of 1.4 that was noncompressible. Her left ABI was 0.73. 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 It was not felt that arterial inflow was adequate for healing of a foot  ulcer. Her Doppler waveforms looked monophasic ===== READMISSION 02/28/17; this is in an now 50 year old woman we've had at several different occasions in this clinic. 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 She has a remote ex-smoker. She was last seen in this clinic by Dr. Con Memos I think in May. More recently she is been followed by her podiatrist [name] an infectious disease Dr. Megan Salon. She has 2 open wounds the major one is over the right first metatarsal head she also has a wound on the left plantar foot. an MRI of the right foot on 01/01/17 showed a soft tissue ulcer along the plantar aspect of the first metatarsal base consistent with osteomyelitis of the first metatarsal stump. Dr. 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 According to the patient this is been open for slightly over a month. She has been on a combination of Cipro 500 twice a day, Zyvox 600 twice a day and Flagyl 500 3 times a day for over a month now as directed by Dr. Megan Salon. cultures of the right foot earlier this year showed MRSA in January and Serratia in May. January also had a few viridans strep. Recent x-rays of both feet were done and [name] office and I don't have these reports. The patient has known PAD and has a history of aleft femoropopliteal bypass in April 2017. 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 Dwyane Dee. In spite of this the last hemoglobin A1c I can see is 10.1 on 01/01/2017. She is being referred by 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 She is been using Medihoney to this area and also an area on the left midfoot. She is using  healing sandals bilaterally. ABIs in this clinic at the left posterior tibial was 1.1 noncompressible on the right READMISSION Non invasive vascular NOVANT 5/18 Aftercare following surgery of the circulatory system Procedure Note - Interface, External Ris In - 08/13/2016 11:05 AM EDT Procedure: Examination consists of physiologic resting arterial pressures of the brachial and ankle arteries bilaterally with continuous wave Doppler waveform analysis. Previous: Previous exam performed on 02/14/16 demonstrated ABIs of  Rt = 1.19 and Lt = 1.33. Right: ABI = non-compressible PT 1.47 DP. S/P transmet amputation. , Left: ABI = 1.52, 2nd digit pressure = 87 mmHg Conclusions: Right: ABI (>1.3) may be falsely elevated, suggesting medial calcification. Left: ABI (>1.3) may be falsely elevated, suggesting medial calcification The patient is a now 50 year old type I diabetic is had multiple issues her graded to chronic diabetic foot ulcers. 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 We had her in the clinic lastin November. At that point she had wounds on her bilateral feet.she had wanted to try hyperbarics however the healogics review process denied her because she hadn't followed up with her vascular surgeon for her left femoropopliteal bypass. The bypass was done by Dr. Raul Del at [facility]. We made her a follow-up with Dr. Raul Del however she did not keep the appointment and therefore she was not approved The patient shows me a small wound on her left fourth metatarsal head on her phone. She developed rapid discoloration in the plantar aspect of the left foot and she was admitted to hospital from 2/2 through 05/10/17 with wet gangrene of the left foot osteomyelitis of the fourth metatarsal heads. She was admitted acutely ill with a temperature of 103. She was started on broad-spectrum vancomycin and cefepime. On 05/06/17 she  was taken to the OR by [name] her podiatric surgeon for an incision and drainage irrigation of the left foot wound. Cultures from this surgery revealed group be strep and anaerobes. she was seen by Dr.Xu of orthopedic surgery and scheduled for a below-knee amputation which she u refused. Ultimately she was discharged on Levaquin and Flagyl for one month. MRI 05/05/17 done while she was in the hospital showed abscess adjacent to the fourth metatarsal head and neck small abscess around the fourth flexor tendon. Inflammatory phlegmon and gas in the soft tissues along the lateral aspect of the fourth phalanx. Findings worrisome for osteomyelitis involving the fourth proximal and middle phalanx and also the third and fourth metatarsals. Finally the patient had actually shortly before this followed up with Dr. Raul Del at no time on 04/29/17. He felt that her left femoropopliteal bypass was patent he felt that her left-sided toe pressures more than adequate for healing a wound on the left foot. This was before her acute presentation. 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 05/28/17; she is started hyperbarics. The patient tells me that for some reason she was not actually on Levaquin but I think on ciprofloxacin. She was on Flagyl. She only started her Levaquin yesterday due to some difficulty with the pharmacy and perhaps her sister picking it up. She has an appointment with [name] tomorrow and with infectious disease early next week. She has no new complaints 06/06/17; the patient continues in hyperbarics. She saw [name] on 05/29/17 who is her podiatric surgeon. He is elected for a transmetatarsal amputation on 06/27/17. I'm not sure at what level he plans to do this amputation. The patient is unaware ooShe also saw Dr. Megan Salon of infectious disease who elected to continue her on current antibiotics I think this is ciprofloxacin and Flagyl. I'll need to clarify with her tomorrow if she actually has  this. We're using silver alginate to the actual wound. Necrotic surface today with material under the flap of her foot. ooOriginal MRI showed abscesses as well as osteomyelitis of the proximal and middle fourth phalanx and the third and fourth metatarsal heads 06/11/17; patient  continues in hyperbarics and continues on oral antibiotics. She is doing well. The wound looks better. The necrotic part of this under the flap in her superior foot also looks better. she is been to see [name]. I haven't had a chance to look at his note. Apparently he has put the transmetatarsal amputation on hold her request it is still planning to take her to the OR for debridement and product application ACEL. I'll see if I can find his note. I'll therefore leave product ordering/requests to [name] for now. I was going to look at Dermagraft 06/18/17-she is here in follow-up evaluation for bilateral foot wounds. She continues with hyperbaric therapy. She states she has been applying manuka honey to the right plantar foot and alternate manuka honey and silver alginate to the left foot, despite our orders. We will continue with same treatment plan and she will follo up next week. 06/25/17; I have reviewed [name] last note from 3/11. She has operative debridement in 2 days' time. By review his note apparently they're going to place there is skin over the majority of this wound which is a good choice. She has a small satellite area at the most proximal part of this wound on the left plantar foot. The area on the right plantar foot we've been using silver alginate and it is close to healing. 07/02/17; unfortunately the patient was not easily approved for [name] proposed surgery. I'm not completely certain what the issue is. She has been using silver alginate to the wound she has completed a first course of hyperbarics. She is still on Levaquin and Flagyl. I have really lost track of the time course here.I suspect she  should have another week to 2 of antibiotics. I'll need to see if she is followed up with infectious disease Dr. Megan Salon 07/09/17; the patient is followed up with Dr. Megan Salon. 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 She continues on Levaquin and metronidazole continuing both of these for now I think she is been on fr about 6 weeks. She still has some drainage but no pain. No fever. Her had been plans for her to go to the OR for operative debridement with her podiatrist [name], I am not exactly sure where that is. I'll probably slip a note to [name] today. I note that she follows with Dr. Dwyane Dee of endocrinology. We have her recertified for hyperbaric oxygen. I have not heard about Dermagraft however I'll see if [name] is planning a skin substitute as well 07/16/17; the patient tells me she is just about out of [place]. I'll need to check Dr. Hale Bogus last notes on this. She states she has plenty of Flagyl however. She comes in today complaining of pain in the right lateral foot which she said lasted for about a day. The wound on the right foot is actually much more medially. She also tells me that the [facility] cost a lot of pain in the left foot wound and she turned back to silver alginate. Finally Dermagraft has a $542 per application co-pay. She cannot afford this 07/23/17; patient arrives today with the wound not much smaller. There is not much new to add. She has not heard from [name] all try to put in a call to them today. She was asking about Dermagraft again and she has an over $706 per application co-pay she states that she would be willing to try to  do a payment plan. I been tried to avoid this. We've been using silver alginate, I'll change to [facility] 07/30/17-She is here in follow-up evaluation for left foot ulcer. She continues hyperbaric medicine. The left foot ulcer is stable we will continue with same treatment  plan 08/06/17; she is here for evaluation of her left foot ulcer. Currently being treated for hyperbarics or underlying osteomyelitis. She is completed antibiotics. The left foot ulcer is better smaller with healthier looking granulation. For various reasons I am not really clear on we never got her back to the OR with [name]. He did not respond to my secure text message. Nevertheless I think that surgery on this point is not necessary nor am I completely clear that a skin substitute is necessary The patient is complaining about pain on the outside of her right foot. 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 There is no erythema. She also states the foot is warm versus her other part of her upper leg and this is largely true. It is not totally clear to me what's causing this. She thinks it's different from her usual neuropathy pain 08/13/17; she arrives in clinic today with a small wound which is superficial on her right first metatarsal head. 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 She tells [place] she was up on her feet over the Mother's Day celebration. ooThe large wound is on the left foot. Continues with hyperbarics for underlying osteomyelitis. We're using Hydrofera Blue. She asked me today about where we were with Dermagraft. I had actually excluded this because of the co-pay however she wants to assume this therefore I'll recheck the co-pay an order for next week. 08/20/17; the patient agreed to accept the co-pay of the first Apligraf which we applied today. She is disappointed she is finishing hyperbarics will run this through the insurance on the extent of the foot infection and the extent of the wound that she had however she is already had 60 dive's. Dermagraft No. 1 08/27/17; Dermagraft No. 2. She is not eligible for any more hyperbaric treatments this month. She reports a fair amount of drainage and she actually changed to the external dressings without  disturbing the direct contact layer 09/03/17; the patient arrived in clinic today with the wound superficially looking quite healthy. Nice vibrant red tissue with some advancing epithelialization although not as much adherence of the flap as I might like. However she noted on her own fourth toe some bogginess and she brought that to our attention. Indeed this was boggy feeling like a possibility of subcutaneous fluid. 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 She is not been unwell. We've been using Dermagraft 09/10/17; the culture that I did not last week was MRSA. She saw Dr. Megan Salon this morning who is going to start her on vancomycin. I had sent him a secure a text message yesterday. I also spoke with her podiatric surgeon [name] about surgery on this foot the options for conserving a functional foot etc. Promised me he would see her and will make back consultation today. Paradoxically her actual wound on the plantar aspect of her left foot looks really quite good. I had given her 5 days worth of Baxdella to cover her for MRSA. Her MRI came back showing osteomyelitis within the third metatarsal shaft and head and base of the third and fourth proximal phalanx. She had  extensive inflammatory changes throughout the soft tissue of the lateral forefoot. With an ill-defined fluid around the fourth metatarsal extending into the plantar and dorsal soft tissues 09/19/17; the patient is actually on oral Septra and Flagyl. She apparently refused IV vancomycin. She also saw [name] at my request who is planning her for a left BKA sometime in mid July. MRI showed osteomyelitis within the third metatarsal shaft and head and the basis of the third and fourth proximal phalanx. I believe there was felt to be possible septic arthritis involving the third MTP. 09/26/17; the patient went back to Dr. Megan Salon at my suggestion and is now  receiving IV daptomycin. Her wound continues to look quite good making the decision to proceed with a transmetatarsal amputation although more difficult for the patient. I believe in my extensive discussions with her she has a good sense of the pros and cons of this. I don't NV the tuft decision she has to make. She has an appointment with [name] I believe in mid July and I previously spoken to him about this issue Has we had used 3 previous Dermagraft. Given the condition of the wound surface I went ahead and added the fourth one today area and I did this not fully realizing that she'll be traveling to [place] next week. I'm hopeful she can come back in 2 weeks 10/21/17; Her same Dermagraft on for about 3-1/2 weeks. In spite of this the wound arrives looking quite healthy. There is been a lot of healing dimensions are smaller. Looking at the square shaped wound she has now there is some undermining and some depth medially under the undermining although I cannot palpate any bone. No surrounding infection is obvious. She has difficult questions about how to look at this going forward vis--vis amputations versus continued medical therapy. T be truthful the wound is looks so o healthy and it is continued to contract. Hard to justify foot surgery at this point although I still told her that I think it might come to that if we are not able to eradicate the underlying MRSA. She is still highly at risk and she understands this 11/06/17 on evaluation today patient appears to be doing better in regard to her foot ulcer. She's been tolerating the dressing changes without complication. Currently she is here for her Dermagraft #6. Her wound continues to make excellent progress at this point. She does not appear to have any evidence of infection which is good news. 11/13/17 on evaluation today patient appears to be doing excellent at this time. She is here for repeat Dermagraft application. This is #7. Overall  her wound seems to be making great progress. 12/05/17; the patient arrives with the wound in much better condition than when I last saw this almost 6 weeks ago. She still has a small probing area in the left metatarsal head region on the lateral aspect of her foot. We applied her last Dermagraft today. ooSince the last time she is here she has what appears to a been a blood blister on the plantar aspect of left foot although I don't see this is threatening. There is also a thick raised tissue on the right mid metatarsal head region. This was not there I don't think the last time she was here 3 weeks ago. 12/12/17; the patient continues to have a small programming area in the left metatarsal head region on the lateral aspect of her foot which was the initial large surgical wound. I applied her  last Apligraf last week. I'm going to use Endoform starting today ooUnfortunately she has an excoriated area in the left mid foot and the right mid foot. The left midfoot looks like a blistered area this was not opened last week it certainly is open today. Using silver alginate on these areas. She promises me she is offloading this. 12/19/17; the small probing area in the left metatarsal head eyes think is shallower. In general her original wound looks better. We've been using Endoform. The area inferiorly that I think was trauma last week still requires debridement a lot of nonviable surface which I removed. She still has an open open area distally in her foot ooSimilarly on the right foot there is tightly adherent surface debris which I removed. Still areas that don't look completely epithelialized. This is a small open area. We used silver alginate on these areas 12/26/2017; the patient did not have the supplies we ordered from last week including the Endoform. The original large wound on the left lateral foot looks healthy. She still has the undermining area that is largely unchanged from last week. She has  the same heavily callused raised edged wounds on the right mid and left midfoot. Both of these requiring debridement. We have been using silver alginate on these areas 01/02/2018; there is still supply issues. We are going to try to use Prisma but I am not sure she actually got it from what she is saying. She has a new open area on the lateral aspect of the left fourth toe [previous fifth ray amputation]. Still the one tunneling area over the fourth metatarsal head. The area is in the midfoot bilaterally still have thick callus around them. She is concerned about a raised swelling on the lateral aspect of the foot. However she is completely insensate 01/10/2018; we are using Prisma to the wounds on her bilateral feet. Surprisingly the tunneling area over the left fourth metatarsal head that was part of her original surgery has closed down. She has a small open area remaining on the incision line. 2 open areas in the midfoot. 02/10/2018; the patient arrives back in clinic after a month hiatus. She was traveling to visit family in [place]. Is fairly clear she was not offloading the areas on her feet. The original wound over the left lateral foot at the level of metatarsal heads is reopened and probes medially by about a centimeter or 2. She notes that a week ago she had purulent drainage come out of an area on the left midfoot. Paradoxically the worst area is actually on the right foot is extensive with purulent drainage. We will use silver alginate today 02/17/2018; the patient has 3 wounds one over the left lateral foot. She still has a small area over the metatarsal heads which is the remnant of her original surgical wound. This has medial probing depth of roughly 1.4 cm somewhat better than last week. The area on the right foot is larger. We have been using silver alginate to all areas. The area on the right foot and left foot that we cultured last week showed both Klebsiella and Proteus. Both of  these are quinolone sensitive. The patient put her's self on Bactrim and Flagyl that she had left hanging around from prior antibiotic usages. She was apparently on this last week when she arrived. I did not realize this. Unfortunately the Bactrim will not cover either 1 of these organisms. We will send in Cipro 500 twice daily for a week 03/04/2018;  the patient has 2 wounds on the left foot one is the original wound which was a surgical wound for a deep DFU. At one point this had exposed bone. She still has an area over the fourth metatarsal head that probes about 1.4 cm although I think this is better than last week. I been using silver nitrate to try and promote tissue adherence and been using silver alginate here. ooShe also has an area in the left midfoot. This has some depth but a small linear wound. Still requiring debridement. ooOn the right midfoot is a circular wound. A lot of thick callus around this area. ooWe have been using silver alginate to all wound areas ooShe is completed the ciprofloxacin I gave her 2 weeks ago. 03/11/2018; the patient continues to have 2 open areas on the left foot 1 of which was the original surgical wound for a deep DFU. Only a small probing area remains although this is not much different from last week we have been using silver alginate. The other area is on the midfoot this is smaller linear but still with some depth. We have been using silver alginate here as well ooOn the right foot she has a small circular wound in the mid aspect. This is not much smaller than last time. We have been using silver alginate here as well 03/18/2018; she has 3 wounds on the left foot the original surgical wound, a very superficial wound in the mid aspect and then finally the area in the mid plantar foot. She arrives in today with a very concerning area in the wound in the mid plantar foot which is her most proximal wound. There is undermining here of roughly 1-1/2 cm  superiorly. Serosanguineous drainage. She tells me she had some pain on for over the weekend that shot up her foot into her thigh and she tells me that she had a nodule in the groin area. ooShe has the single wound in the right foot. ooWe are using endoform to both wound areas 03/24/2018; the patient arrives with the original surgical wound in the area on the left midfoot about the same as last week. There is a collection of fluid under the surface of the skin extending from the surgical wound towards the midfoot although it does not reach the midfoot wound. The area on the right foot is about the same. Cultures from last week of the left midfoot wound showed abundant Klebsiella abundant Enterococcus faecalis and moderate methicillin resistant staph I gave her Levaquin but this would have only covered the Klebsiella. She will need linezolid 04/01/2018; she is taking linezolid but for the first few days only took 1 a day. I have advised her to finish this at twice daily dosing. In any case all of her wounds are a lot better especially on the left foot. The original surgical wound is closed. The area on the left midfoot considerably smaller. The area on the right foot also smaller. 04/08/2018; her original surgical wound/osteomyelitis on the left foot remains closed. She has area on the left foot that is in the midfoot area but she had some streaking towards this. This is not connected with her original wound at least not visually. ooSmall wound on the right midfoot appears somewhat smaller. 04/15/18; both wounds looks better. Original wound is better left midfoot. Using silver alginate 1/21; patient states she uses saltwater soak in, stones or remove callus from around her wounds. She is also concerned about a blood blister she had  on the left foot but it simply resolved on its own. We've been using silver alginate 1/28; the patient arrives today with the same streaking area from her metatarsals  laterally [the site of her original surgical wound] down to the middle of her foot. There is some drainage in the subcutaneous area here. This concerns me that there is actually continued ongoing infection in the metatarsals probably the fourth and third. This fixates an MRI of the foot without contrast [chronic renal failure] ooThe wound in the mid part of the foot is small but I wonder whether this area actually connects with the more distal foot. ooThe area on the right midfoot is probably about the same. Callus thick skin around the small wound which I removed with a curette we have been using silver alginate on both wound areas 2/4; culture I did of the draining site on the left foot last time grew methicillin sensitive staph aureus. MRI of the left foot showed interval resolution of the findings surrounding the third metatarsal joint on the prior study consistent with treated osteomyelitis. Chronic soft tissue ulceration in the plantar and lateral aspect of the forefoot without residual focal fluid collection. No evidence of recurrent osteomyelitis. 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 She tells me she had nausea and vomiting over the weekend and missed 2 days. In general her foot looks much the same. She has a small open area just below the left fourth metatarsal head. A linear area in the left midfoot. Some discoloration extending from the inferior part of this into the left lateral foot although this appears to be superficial. She has a small area on the right midfoot which generally looks smaller after debridement 2/18; the patient is completing his cephalexin and has another 2 days. She continues to have open areas on the left and right  foot. 2/25; she is now off antibiotics. The area on the left foot at the site of her original surgical wound has closed yet again. She still has open areas in the mid part of her foot however these appear smaller. The area on the right mid foot looks about the same. We have been using silver alginate She tells me she had a serious hypoglycemic spell at home. She had to have EMS called and get IV dextrose 3/3; disappointing on the left lateral foot large area of necrotic tissue surrounding the linear area. This appears to track up towards the same original surgical wound. Required extensive debridement. The area on the right plantar foot is not a lot better also using silver 3/12; the culture I did last time showed abundant enterococcus. I have prescribed Augmentin, should cover any unrecognized anaerobes as well. In addition there were a few MRSA and Serratia that would not be well covered although I did not want to give her multiple antibiotics. She comes in today with a new wound in the right midfoot this is not connected with the original wound over her MTP a lot of thick callus tissue around both wounds but once again she said she is not walking on these areas 3/17-Patient comes in for follow-up on the bilateral plantar wounds, the right midfoot and the left plantar wound. Both these are heavily callused surrounding the wounds. We  are continuing to use silver alginate, she is compliant with offloading and states she uses a wheelchair fairly often at home 3/24; both wound areas have thick callus. However things actually look quite a bit better here for the majority of her left foot and the right foot. 3/31; patient continues to have thick callused somewhat irritated looking tissue around the wounds which individually are fairly superficial. There is no evidence of surrounding infection. We have been using silver alginate however I change that to [place] today 4/17; patient returns to clinic  after having a scare with Covid she tested negative in her primary doctor's office. She has been using Hydrofera Blue. She does not have an open area on the right foot. On the left foot she has a small open area with the mid area not completely viable. She showed me pictures of what looks like a hemorrhagic blister from several days ago but that seems to have healed over this was on the lateral left foot 4/21; patient comes in to clinic with both her wounds on her feet closed. However over the weekend she started having pain in her right foot and leg up into the thigh. She felt as though she was running a low-grade fever but did not take her temperature. She took a doxycycline that she had leftover and yesterday a single Septra and metronidazole. She thinks things feel somewhat better. 4/28; duplex ultrasound I ordered last week was negative for DVT or superficial thrombophlebitis. She is completed the doxycycline I gave her. States she is still having a lot of pain in the right calf and right ankle which is no better than last week. She cannot sleep. She also states she has a temperature of up to 101, coughing and complaining of visual loss in her bilateral eyes. Apparently she was tested for Covid 2 weeks ago at [facility] and that was negative. Readmission: 09/03/18 patient presents back for reevaluation after having been evaluated at the end of April regarding erythema and swelling of her right lower extremity. Subsequently she ended up going to the hospital on 07/29/18 and was admitted not to be discharged until 08/08/18. Unfortunately it was noted during the time that she was in the hospital that she did have methicillin-resistant Staphylococcus aureus as the infection noted at the site. It was also determined that she did have osteomyelitis which appears to be fairly significant. She was treated with vancomycin and in fact is still on IV vancomycin at dialysis currently. This is actually slated to  continue until 09/12/18 at least which will be the completion of the six weeks of therapy. Nonetheless based on what I'm seeing at this point I'm not sure she will be anywhere near ready to discontinue antibiotics at that time. Since she was released from the hospital she was seen by [name] who is her podiatrist on 08/27/18. His note specifically states that he is recommended that the patient needs of one knee amputation on the right as she has a life- threatening situation that can lead quickly to sepsis. The patient advised she would like to try to save her leg to which [name] apparently told her that this was against all medical advice. She also want to discontinue the Wound VAC which had been initiated due to the fact that she wasn't pleased with how the wound was looking and subsequently she wanted to pursue applying Medihoney at that time. He stated that he did not believe that the right lower extremity was salvageable  and that the patient understood but would still like to attempt hyperbaric option therapy if it could be of any benefit. She was therefore referred back to [place] for further evaluation. He plans to see her back next week. Upon inspection today patient has a significant amount purulent drainage noted from the wound at this point. The bone in the distal portion of her foot also appears to be extremely necrotic and spongy. When I push down on the bone it bubbles and seeps purulent drainage from deeper in the end of the foot. I do not think that this is likely going to heal very well at all and less aggressive surgical debridement were undertaken more than what I believe we can likely do here in our office. 09/12/2018; I have not seen this patient since the most recent hospitalization although she was in our clinic last week. I have reviewed some of her records from a complex hospitalization. 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 There  is situation was complicated by MRSA bacteremia and acute on chronic renal failure now on dialysis. She is receiving vancomycin at dialysis. We started her on Dakin's wet-to-dry last week she is changing this daily. There is still purulent drainage coming out of her foot. Although she is apparently "agreeable" to a below-knee amputation which is been suggested by multiple clinicians she wants this to be done in [place]. She apparently has a telehealth visit with that provider sometime in late [place] 6/24. I have told her I think this is probably too long. Nevertheless I could not convince her to allow a local doctor to perform BKA. 09/19/2018; the patient has a large necrotic area on the right anterior foot. She has had previous transmetatarsal amputations. Culture I did last week showed MRSA nothing else she is on vancomycin at dialysis. She has continued leaking purulent drainage out of the distal part of the large circular wound on the right anterior foot. She apparently went to see Dr. 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 Somehow that translated into her being referred to plastic surgery for debridement of the area. I gather she basically refused amputation although I do not have a copy of Dr. Berenice Primas notes. The patient really wants to have a trial of hyperbaric oxygen. I agreed with initial assessment in this clinic that this was probably too far along to benefit however if she is going to have plastic surgery I think she would benefit from ancillary hyperbaric oxygen. The issue here is that the patient has benefited as maximally as any patient I have ever seen from hyperbaric oxygen therapy. Most recently she had exposed bone on the lateral part of her left foot after a surgical procedure and that actually has closed. She has eschared areas in both heels but no open area. She is remained systemically well. I am not optimistic that anything can be done about  this but the patient is very clear that she wants an attempt. The attempt would include a wound VAC further debridements and hyperbaric oxygen along with IV antibiotics. 6/26; I put her in for a trial of hyperbaric oxygen only because of the dramatic response she has had with wounds on her left midfoot earlier this year which was a surgical wound that went straight to her bone over the metatarsal heads and also remotely the left third toe. We will see if we can get this through our review process and insurance. She  arrives in clinic with again purulent material pouring out of necrotic bone on the top of the foot distally. There is also some concerning erythema on the front of the leg that we marked. It is bit difficult to tell how tender this is because of neuropathy. I note from infectious disease that she had her vancomycin extended. 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 She had the wound VAC on for part of the week. The rest of the time she is putting various things on this including Medihoney, "ionized water" silver sorb gel etc. 7/7; follow-up along with HBO. She is still on vancomycin at dialysis. She has a large open area on the dorsal right foot and a small dark eschar area on her heel. There is a lot less erythema in the area and a lot less tenderness. From an infection point of view I think this is better. She still has a lot of necrosis in the remaining right forefoot [previous TMA] we are still using the wound VAC in this area 7/16; follow-up along with HBO. I put her on linezolid after she finished her vancomycin. We started this last Friday I gave her 2 weeks worth. I had the expectation that she would be operatively debrided by Dr. Marla Roe but that still has not happened yet. Patient phoned the office this week. She arrives for review today after HBO. The distal part of this wound is completely necrotic. Nonviable pieces of tendon bone was still  purulent drainage. Also concerning that she has black eschar over the heel that is expanding. I think this may be indicative of infection in this area as well. She has less erythema and warmth in the ankle and calf but still an abnormal exam 7/21 follow-up along with HBO. I will renew her linezolid after checking a CBC with differential monitoring her blood counts especially her platelets. She was supposed to have surgery yesterday but if I am reading things correctly this was canceled after her blood sugar was found to be over 500. I thought Dr. Marla Roe who called me said that they were sending her to the ER but the patient states that was not the case. 7/28. Follow-up along with HBO. She is on linezolid I still do not have any lab work from dialysis even though I called last week. The patient is concerned about an area on her left lateral foot about the level of the base of her fifth metatarsal. I did not really see anything that ominous here however this patient is in [place] ability to point out problems that she is sensing and she has been accurate in the past Finally she received a call from Dr. Marla Roe who is referring her to another orthopedic surgeon stating that she is too booked up to take her to the operating room now. Was still using a wound VAC on the foot 8/3 -Follow-up after HBO, she is got another week of linezolid, she is to call [facility] for an appointment, x-rays of both feet were reviewed, the left foot x-ray with third MTP joint osteo- Right foot x-ray widespread osteo-in the right midfoot Right ankle x-ray does not show any active evidence of infection 8/11-Patient is seen after HBO, the wounds on the right foot appear to be about the same, the heel wound had some necrotic base over tendon that was debrided with a curette 8/21; patient is seen after HBO. The patient's wound on her dorsal foot actually looks reasonably good and there  is substantial amount of  epithelialization however the open area distally still has a lot of necrotic debris partially bone. I cannot really get a good sense of just how deep this probes under the foot. She has been pressuring me this week to order medical maggots through a company in [place] for her. The problem I have is there is not a defined wound area here. On the positive side there is no purulence. She has been to see infectious disease she is still on Septra DS although I have not had a chance to review their notes 8/28; patient is seen in conjunction with HBO. The wounds on her foot continued to improve including the right dorsal foot substantially the, the distal part of this wound and the area on the right heel. We have been using a wound VAC over this chronically. She is still on trimethoprim as directed by infectious disease 9/4; patient is seen in conjunction with HBO. Right dorsal foot wound substantially anteriorly is better however she continues to have a deep wound in the distal part of this that is not responding. We have been using silver collagen under border foam ooArea on the right plantar medial heel seems better. We have been using Hydrofera Blue 12/12/18 on evaluation today patient appears to be doing about the same with regard to her wound based on prior measurements. She does have some necrotic tissue noted on the lateral aspect of the wound that is going require a little bit of sharp debridement today. This includes what appears to be potentially either severely necrotic bone or tendon. Nonetheless other than that she does not appear to have any severe infection which is good news 9/18; it is been 2 weeks since I saw this wound. She is tolerating HBO well. Continued dramatic improvement in the area on the right dorsal foot. She still has a small wound on the heel that we have been using Hydrofera Blue. She continues with a wound VAC 9/24; patient has to be seen emergently today with a swelling  on her right lateral lower leg. She says that she told Dr. Evette Doffing about this and also myself on a couple of occasions but I really have no recollection of this. She is not systemically unwell and her wound really looked good the last time I saw this. She showed this to providers at dialysis and she was able to verify that she was started on cephalexin today for 5 doses at dialysis. She dialyzes on Tuesday Thursday and Saturday. 10/2; patient is seen in conjunction with HBO. The area that is draining on the right anterior medial tibia is more extensive. Copious amounts of serosanguineous drainage with some purulence. We are still using the wound VAC on the original wound then it is stable. Culture I did of the original IandD showed MRSA I contacted dialysis she is now on vancomycin with dialysis treatments. I asked them to run a month 10/9; patient seen in conjunction with HBO. She had a new spontaneous open area just above the wound on the right medial tibia ankle. More swelling on the right medial tibia. Her wound on the foot looks about the same perhaps slightly better. There is no warmth spreading up her leg but no obvious erythema. her MRI of the foot and ankle and distal tib-fib is not booked for next Friday I discussed this with her in great detail over multiple days. it is likely she has spreading infection upper leg at least involving the distal 25% above  the ankle. She knows that if I refer her to orthopedics for infectious disease they are going to recommend amputation and indeed I am not against this myself. We had a good trial at trying to heal the foot which is what she wanted along with antibiotics debridement and HBO however she clearly has spreading infection [probably staph aureus/MRSA]. Nevertheless she once again tells me she wants to wait the left of the MRI. She still makes comments about having her amputation done in [place]. 10/19; arrives today with significant swelling  on the lateral right leg. Last culture I did showed Klebsiella. Multidrug-resistant. Cipro was intermediate sensitivity and that is what I have her on pending her MRI which apparently is going to be done on Thursday this week although this seems to be moving back and forth. She is not systemically unwell. We are using silver alginate on her major wound area on the right medial foot and the draining areas on the right lateral lower leg 10/26; MRI showed extensive abscess in the anterior compartment of the right leg also widespread osteomyelitis involving osseous structures of the midfoot and portions of the hindfoot. Also suspicion for osteomyelitis anterior aspect of the distal medial malleolus. Culture I did of the purulence once again showed a multidrug-resistant Klebsiella. I have been in contact with nephrology late last week and she has been started on cefepime at dialysis to replace the vancomycin We sent a copy of her MRI report to Dr. Geroge Baseman in [place] who is an orthopedic surgeon. The patient takes great stock in his opinion on this. She says she will go to [place] to have her leg amputated if Dr. Geroge Baseman does not feel there is any salvage options. 11/2; she still is not talk to her orthopedic surgeon in [place]. Apparently he will call her at [phone] this afternoon. The quality of this is she has not allowed me to refer her anywhere. She has been told over and over that she needs this amputated but has not agreed to be referred. She tells me her blood sugar was 600 last night but she has not been febrile. 11/9; she never did got a call from the orthopedic surgeon in [place] therefore that is off the radar. We have arranged to get her see orthopedic surgery at [facility]. She still has a lot of draining purulence coming out of the new abscess in her right leg although that probably came from the osteomyelitis in her right foot and heel. Meanwhile the original wound on the right foot  looks very healthy. Continued improvement. The issue is that the last MRI showed osteomyelitis in her right foot extensively she now has an abscess in the right anterior lower leg. There is nobody in [place] who will offer this woman anything but an amputation and to be honest that is probably what she needs. I think she still wants to talk about limb salvage although at this point I just do not see that. She has completed her vancomycin at dialysis which was for the original staph aureus she is still on cefepime for the more recent Klebsiella. She has had a long course of both of these antibiotics which should have benefited the osteomyelitis on the right foot as well as the abscess. 11/16; apparently [place] elective surgery is shut down because of COVID-19 pandemic. I have reached out to some contacts at [facility] to see if we can get her an orthopedic appointment there. I am concerned about continually leaving this but for the moment  everything is static. In fact her original large wound on this foot is closing down. It is the abscess on the right anterior leg that continues to drain purulent serosanguineous material. She is not currently on any antibiotics however she had a prolonged course of vancomycin [1 month] as well as cefepime for a month 02/24/2019 on evaluation today patient appears to be doing better than the last time I saw her. This is not a patient that I typically see. With that being said I am covering for Dr. Dellia Nims this week and again compared to when I last saw her overall the wounds in particular seem to be doing significantly better which is good news. With that being said the patient tells me several disconcerting things. She has not been able to get in to see anyone for potential debridement in regard to her leg wounds although she tells me that she does not think it is necessary any longer because she is taking care of that herself. She noticed a string coming out of  the lower wound on her leg over the last week. The patient states that she subsequently decided that we must of pack something in there and started pulling the string out and as it kept coming and coming she realized this was likely her tendon. With that being said she continued to remove as much of this as she could. She then I subsequently proceeded to using tubes of antibiotic ointment which she will stick down into the wound and then scored as much as she can until she sees it coming out of the other wound opening. She states that in doing this she is actually made things better and there is less redness and irritation. With regard to her foot wound she does have some necrotic tendon and tissue noted in one small corner but again the actual wound itself seems to be doing better with good granulation in general compared to my last evaluation. 12/7; continued improvement in the wound on the substantial part of the right medial foot. Still a necrotic area inferiorly that required debridement but the rest of this looks very healthy and is contracting. She has 2 wounds on the right lateral leg which were her original drainage sites from her abscess but all of this looks a lot better as well. She has been using silver alginate after putting antibiotic biotic ointment in one wound and watching it come out the other. I have talked to her in some detail today. I had given her names of orthopedic surgeons at [facility] for second opinion on what to do about the right leg. I do not think the patient never called them. She has not been able to get a hold of the orthopedic surgeon in [place] that she had put a lot of faith in as being somebody would give her an opinion that she would trust. I talked to her today and said even if I could get her in to another orthopedic surgeon about the leg which she accept an amputation and she said she would not therefore I am not going to press this issue for the  moment 12/14; continued improvement in his substantial wound on the right medial foot. There is still a necrotic area inferiorly with tightly adherent necrotic debris which I have been working on debriding each time she is here. She does not have an orthopedic appointment. Since last time she was here I looked over her cultures which were essentially MRSA on the foot wound  and gram-negative rods in the abscess on the anterior leg. 12/21; continued improvement in the area on the right medial foot. She is not up on this much and that is probably a good thing since I do not know it could support continuous ambulation. She has a small area on the right lateral leg which were remanence of the IandD's I did because of the abscess. I think she should probably have prophylactic antibiotics I am going to have to look this over to see if we can make an intelligent decision here. In the meantime her major wound is come down nicely. Necrotic area inferiorly is still there but looks a lot better 04/06/2019; she has had some improvement in the overall surface area on the right medial foot somewhat narrowedr both but somewhat longer. The areas on the right lateral leg which were initial IandD sites are superficial. Nothing is present on the right heel. We are using silver alginate to the wound areas 1/18; right medial foot somewhat smaller. Still a deep probing area in the most distal recess of the wound. She has nothing open on the right leg. She has a new wound on the plantar aspect of her left fourth toe which may have come from just pulling skin. The patient using Medihoney on the wound on her foot under silver alginate. I cannot discourage her from this 2/1; 2-week follow-up using silver alginate on the right foot and her left fourth toe. The area on the right dorsal foot is contracted although there is still the deep area in the most distal part of the wound but still has some probing depth. No overt  infection 2/15; 2-week follow-up. She continues to have improvement in the surface area on the dorsal right foot. Even the tunneling area from last time is almost closed. The area that was on the plantar part of her left fourth toe over the PIP is indeed closed 3/1; 2-week follow-up. Continued improvement in surface area. The original divot that we have been debriding inferiorly I think has full epithelialization although the epithelialization is gone down into the wound with probably 4 mm of depth. Even under intense illumination I am unable to see anything open here. The remanence of the wound in this area actually look quite healthy. We have been using silver alginate 3/15; 2-week follow-up. Unfortunately not as good today. She has a comma shaped wound on the dorsal foot however the upper part of this is larger. Under illumination debris on the surface She also tells [place] that she was on her right leg 2 times in the last couple of weeks mostly to reach up for things above her head etc. She felt a sharp pain in the right leg which she thinks is somewhere from the ankle to the knee. The patient has neuropathy and is really uncertain. She cannot feel her foot so she does not think it was coming from there 3/29; 2-week follow-up. Her wound measures smaller. Surface of the wound appears reasonable. She is using silver alginate with underlying Medihoney. She has home health. X-rays I did of her tib-fib last time were negative although it did show arterial calcification 4/12; 2-week follow-up. Her wound measures smaller in length. Using manuka honey with silver alginate on top. She has home health. 4/26; 2-week follow-up. Her wound is smaller but still very adherent debris under illumination requiring debridement she has been using manuka honey with silver alginate. She has home health 08/28/19-Wound has about the same size, but with  a layer of eschar at the lateral edge of the amputation site on the right  foot. Been using Hydrofera Blue. She is on suppressive Bactrim but apparently she has been taking it twice daily 6/7; I have not seen this wound and about 6 weeks. Since then she was up in [place]. By her own admission she was walking on the foot because she did not have a wheelchair. The wound is not nearly as healthy looking as it was the last time I saw this. We ordered different things for her but she only uses Medihoney and silver alginate. As far as I know she is on suppressive trimethoprim sulfamethoxazole. She does not admit to any fever or chills. Her CBGs apparently are at baseline however she is saying that she feels some discomfort on the lateral part of her ankle I looked over her last inflammatory markers from the summer 2020 at which time she had a deeply necrotic infected wound in this area. On 11/10/2018 her sedimentation rate was 56 and C-reactive protein 9.9. This was 107 and 29 on 07/29/2018. 6/17; the patient had a necrotic wound the last time she was here on the right dorsal foot. After debridement I did a culture. This showed a very resistant ESBL Klebsiella as well as Enterococcus. Her x-ray of the foot which was done because of warmth and some discomfort showed bone destruction within the carpal bones involving the navicular acute cuboid lateral middle cuneiforms but essentially unchanged from her prior study which was done on 10/29/2018. The findings were felt to represent chronic osteomyelitis. We did inflammatory markers on her. Her white count was 5.25 sedimentation rate 16 and C-reactive protein at 11.1. Notable for the fact that in August 2020 her CRP was 9.9 and sedimentation rate 56. I have looked at her x-rays. It is true that the bone destruction is very impressive however the patient came into this clinic for the wound on her right foot with pieces of bone literally falling out anteriorly with purulent material. I am not exactly sure I could have expected anything  different. She has not been systemically unwell no fever chills or blood sugars have been reasonable. 6/28; she arrives with a right heel closed. The substantial area on the right anterior foot looks healthy. Much better looking surface. I think we can change to [facility] seems to help this previously. She is getting her antibiotics at dialysis she should be just about finished 7/9; changed to [facility] last week. Surface wound looks satisfactory not much change in surface area however. She is going to [place] state next week this is usually a difficult thing for this patient follow-up will be for 2 weeks. 7/23; using Hydrofera Blue. She returns from her trip and the wound looks surprisingly good. Usually when this patient goes on trips she comes back with a lot of problems with the wound. She is saying that she sometimes feels an episodic "crunching" feeling on the lateral part of the foot. She is neuropathic and not feeling pain but wonders whether this could be a neuropathic dysesthesia. 11/13/19-Patient returns after 3 weeks, the wound itself is stable and patient states that there is nothing new going on she is on some extra anxiety medications and is resisting the temptation to pick at the dry skin around the wound. 9/20; patient has not been here in over a month and I have not seen her in 2 months. The wound in terms of size I think is about the same.  There is no exposed bone. She has a nonviable surface on this. She is supposed to be using [facility] however she is also been using some form of honey preparation as well as a silver-based dressings. I do not think she has any pattern to this. 10/4; 2-week follow-up. Patient has been using some form of spray which she says has honey and silver to purchase this online she has been covering it with gauze. In spite of this the wound actually looks quite good. The deeper divot distally appears to be close down. There is a rim of  epithelialization. 10/18; 2-week follow-up. Patient has been using her Hydrofera Blue covered with her silver honey spray that she got online. 11/1; 2-week follow-up. She is using Hydrofera Blue with a silver honey spray. Wound bed is measuring smaller. She has noticed that her foot is warmer on the right. She is concerned about infection. For a long period of time I had her on prophylactic trimethoprim sulfamethoxazole DS 1 tablet daily. She is asking for this to be restarted. The patient is walking on this foot because of repairs that are being done in a home her but her room is on the second floor she has to go up and down stairs. I have cautioned against this however as usual she will do exactly what she wants to do 11/15; 2-week follow-up. She uses Hydrofera Blue with a silver/honey spray which I have never heard of. I think her wound looks about the same. Some epithelialization. No evidence that this is infected. I think she is walking on this more than we agreed on. She is going on extensive vacation over Thanksgiving 12/3; 2-week follow-up. She is using Hydrofera Blue however over Thanksgiving she ran out of this and she is simply been using Medihoney. In spite of this her wound is smaller almost divided into 2 now. She traveled extensively over Thanksgiving and actually looks quite good in spite of this. Usually this is been a marker of problems for her 12/17; 2-week follow-up. She is using Hydrofera Blue. The wound is smaller. Debris on the surface of this is fibrinous. She is traveling to [place] over the holidays which never bodes well for her wounds. I think she is walking more on her feet then she is even willing to admit and she tells me she does walk 2/11; using Hydrofera Blue. Her wounds are contracting however she walks in the clinic with a history that she has not been able to eat she has had vomiting. She also has right eye problems. Her blood sugar was 567. She had blood work  done at dialysis and we called there to get her blood work although it still had not returned although they should have this by the end of the day we were told. She also stated that she was not sure what her blood sugar was as her glucose monitor was not working and not coming to tomorrow. She also for some reason does not think her insulin pump is working well. Her endocrinologist is Dr. Elayne Snare 06/03/2020 upon evaluation today patient actually appears to be doing excellent in regard to her wound. In fact she tells me it was not even bleeding until she picked a dry piece of skin off while we were getting ready to come in and see her today. Fortunately there is no signs of active infection which is great news and overall very pleased. She is going to be seeing her neurologist sometime shortly I believe  it might even be on Monday. Nonetheless there can proceed with the work-up as far as anything else going on with her eye the fact that her right eyelid is drooping. 3/25; right medial dorsal foot. She has superficial areas here that appear to be fully epithelialized she is using Medihoney and silver alginate and some combination. ooShe has a new area on the mid left foot at roughly the level of the fifth metatarsal base 4/84/8; right medial dorsal foot. Still superficial areas here. I cleaned up 1 of these today she has been using I think mostly Medihoney but I would like her to use silver alginate. The area on the mid part of her foot also looks improved on the left. Since she was last here she tells me that she noted pus in the area of her left foot. She told dialysis about this they did a culture and ultimately she is on IV antibiotics but were not sure which one. They referred her to [name] he said that he did not need to follow her. I have not really verified any of this and I do not know what antibiotic she is on at dialysis 4/29; patient has been using Medihoney to her wounds. She reports  taking off the toenail to her left second toe. She states that home health discharged her and she has not had help with dressing changes. She denies any signs and symptoms of infection. 5/13; 2-week follow-up. Miraculously the wound on the right foot dorsally is healed. She has an area on the tip of her left third toe and an area in the left midfoot. 5/26; patient presents for 2-week follow-up. She has 2 open wounds 1 to her plantar foot and the other to the left third toe. She has noticed increased swelling and redness to the toe wound. It is unclear exactly what she is doing for dressing changes as she has multiple dressing options at home. She states that it is easiest to do [facility] on the plantar wound and Prisma or Medihoney on the toe wound. 6/3; saw Dr. Maryjane Hurter week. She put her on Keflex because of the swelling I think of the left second toe. She arrives today with new breakdown x3 on the dorsal right foot which is disappointing. She also complains of pain in the right ankle, 3 liquid stools per day, chills without fever 6/17; patient was in [place]. I think a little more ambulatory than I would like to hear. In any case she still has the 3 small open areas on the dorsal part of her foot on the right medial leg these look about the same as last time. She is supposed to be using Prisma but I think used Medihoney. She has an area on the left lateral plantar foot which had undermining under her skin and a blood blister with the 2 areas communicating today. And finally an area on the tip of her left second toe 6/30; patient has 3 small areas that look better than the last time on the right dorsal foot. She uses a combination of her products including collagen, Hydrofera Blue and the Medihoney nevertheless they look better. The area on the left midfoot might have been healed although she pulled some skin off superficial open area. The left second toe still does not look that healthy. Open  areas on the tips of the Objective Constitutional Patient is hypertensive.. Pulse regular and within target range for patient.Marland Kitchen Respirations regular, non-labored and within target range.. Temperature is  normal and within the target range for the patient.Marland Kitchen Appears in no distress. Vitals Time Taken: 1:50 PM, Height: 67 in, Weight: 125 lbs, BMI: 19.6, Temperature: 98.8 F, Pulse: 91 bpm, Respiratory Rate: 18 breaths/min, Blood Pressure: 156/82 mmHg, Pulse Oximetry: 98 %. General Notes: Wound exam oo Left plantar foot wound is much more superficial this is in the mid part of her foot. This does not need any debridement better than last time oo She has 3 small areas on the right medial foot these are better and look like they are improved oo Tip of the left second toe still does not look Healthy to me. Integumentary (Hair, Skin) Wound #50 status is Open. Original cause of wound was Skin T ear/Laceration. The date acquired was: 06/24/2020. The wound has been in treatment 13 weeks. The wound is located on the [place]. The wound measures 0.1cm length x 0.1cm width x 0.1cm depth; 0.008cm^2 area and 0.001cm^3 volume. There is Fat Layer (Subcutaneous Tissue) exposed. There is no tunneling or undermining noted. There is a small amount of serosanguineous drainage noted. The wound margin is well defined and not attached to the wound base. There is large (67-100%) red granulation within the wound bed. There is no necrotic tissue within the wound bed. Wound #51 status is Open. Original cause of wound was Trauma. The date acquired was: 07/29/2020. The wound has been in treatment 8 weeks. The wound is located on the Left T Second. The wound measures 0.2cm length x 0.6cm width x 0.1cm depth; 0.094cm^2 area and 0.009cm^3 volume. There is Fat Layer oe (Subcutaneous Tissue) exposed. There is no tunneling or undermining noted. There is a small amount of serosanguineous drainage noted. The wound margin  is distinct with the outline attached to the wound base. There is large (67-100%) red granulation within the wound bed. There is no necrotic tissue within the wound bed. Wound #52 status is Open. Original cause of wound was Gradually Appeared. The date acquired was: 09/02/2020. The wound has been in treatment 3 weeks. The wound is located on the Right Foot. The wound measures 1.1cm length x 3.8cm width x 0.1cm depth; 3.283cm^2 area and 0.328cm^3 volume. There is Fat Layer (Subcutaneous Tissue) exposed. There is no tunneling or undermining noted. There is a medium amount of serous drainage noted. The wound margin is distinct with the outline attached to the wound base. There is small (1-33%) pink granulation within the wound bed. There is no necrotic tissue within the wound bed. 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 - Dr. Dellia Nims Bathing/ Shower/ Hygiene: May shower and wash wound with soap and water. Edema Control - Lymphedema / SCD / Other: Elevate legs to the level of the heart or above for 30 minutes daily and/or when sitting, a frequency of: - throughout the day Avoid standing for long periods of time. Moisturize legs daily. - with dressing changes Off-Loading: Open toe surgical [name] to: - to both feet Other: - minimal weight bearing right foot WOUND #50: - Foot Wound Laterality: Plantar, Left Cleanser: Wound Cleanser Every Other Day/30 Days Discharge Instructions: Cleanse the wound with wound cleanser or normal saline prior to applying a clean dressing using gauze sponges, not  tissue or cotton balls. Prim Dressing: KerraCel Ag Gelling Fiber Dressing,  2x2 in (silver alginate) Every Other Day/30 Days ary Discharge Instructions: Apply silver alginate to wound bed as instructed Secondary Dressing: Woven Gauze Sponge, Non-Sterile 4x4 in Every Other Day/30 Days Discharge Instructions: Apply over primary dressing as directed. Secured With: Elastic Bandage 4 inch (ACE bandage) Every Other Day/30 Days Discharge Instructions: Secure with ACE bandage as directed. Secured With: [employer], 4.5x3.1 (in/yd) Every Other Day/30 Days Discharge Instructions: Secure with Kerlix as directed. Secured With: Paper T ape, 2x10 (in/yd) Every Other Day/30 Days Discharge Instructions: Secure dressing with tape as directed. WOUND #51: - T Second Wound Laterality: Left oe Cleanser: Soap and Water Every Other Day/15 Days Discharge Instructions: May shower and wash wound with dial antibacterial soap and water prior to dressing change. Prim Dressing: KerraCel Ag Gelling Fiber Dressing, 4x5 in (silver alginate) Every Other Day/15 Days ary Discharge Instructions: Apply silver alginate to wound bed as instructed Secondary Dressing: Woven Gauze Sponges 2x2 in Every Other Day/15 Days Discharge Instructions: Apply over primary dressing as directed. Secured With: Child psychotherapist, Sterile 2x75 (in/in) Every Other Day/15 Days Discharge Instructions: Secure with stretch gauze as directed. Secured With: Paper T ape, 1x10 (in/yd) Every Other Day/15 Days Discharge Instructions: Secure dressing with tape as directed. WOUND #52: - Foot Wound Laterality: Right Cleanser: Soap and Water Every Other Day/15 Days Discharge Instructions: May shower and wash wound with dial antibacterial soap and water prior to dressing change. Prim Dressing: Promogran Prisma Matrix, 4.34 (sq in) (silver collagen) Every Other Day/15 Days ary Discharge Instructions: Moisten collagen with saline or hydrogel Secondary Dressing: Woven Gauze Sponges 2x2 in Every Other Day/15  Days Discharge Instructions: Apply over primary dressing as directed. Secured With: Elastic Bandage 4 inch (ACE bandage) Every Other Day/15 Days Discharge Instructions: Secure with ACE bandage as directed. Secured With: Child psychotherapist, Sterile 2x75 (in/in) Every Other Day/15 Days Discharge Instructions: Secure with stretch gauze as directed. Secured With: Paper T ape, 1x10 (in/yd) Every Other Day/15 Days Discharge Instructions: Secure dressing with tape as directed. 1. I have gone with the silver collagen again on the right dorsal foot. 2. Silver alginate in the left foot and left second toe 3. I been reluctant to x-ray her left foot although I could be convinced to get them. Antibiotics if this toe deteriorates. 4. She tells me she is on her way to [place] again in mid July. This usually results in deterioration from the Electronic Signature(s) Signed: 09/29/2020 5:19:12 PM By: Linton Ham MD Entered By: Linton Ham on 09/29/2020 14:49:17 -------------------------------------------------------------------------------- SuperBill Details Patient Name: Date of Service: [address] [name] 09/29/2020 Medical Record Number: [id number] Patient Account Number: [id number] Date of Birth/Sex: Treating RN: 1970-05-10 (50 y.o. 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 Facility Procedures CPT4 Code: [id number] Description: 16010 - WOUND CARE VISIT-LEV 5 EST  PT Modifier: Quantity: 1 Physician Procedures : CPT4 Code Description Modifier [id number] - WC PHYS LEVEL 3 - EST PT ICD-10 Diagnosis Description  L97.514 Non-pressure chronic ulcer of other part of right foot with necrosis of bone L97.521 Non-pressure chronic ulcer of other part of left foot  limited to breakdown of skin L97.529 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

## 2020-10-04 NOTE — Progress Notes (Signed)
DOVE, GRESHAM ([id number]) Visit Report for 09/29/2020 Arrival Information Details Patient Name: Date of Service: [name] 09/29/2020 1:30 PM Medical Record Number: [id number] Patient Account Number: [id number] Date of Birth/Sex: Treating RN: 01/03/1971 (50 y.o. F) Leane Call Primary Care Irys Nigh: [name], [name] Other Clinician: Referring Elyjah Hazan: Treating Thai Burgueno/Extender: [name], [name] Weeks in Treatment: 45 Visit Information History Since Last Visit All ordered tests and consults were completed: No Patient Arrived: Wheel Chair Added or deleted any medications: No Arrival Time: 13:45 Any new allergies or adverse reactions: No Accompanied By: alone Had a fall or experienced change in No Transfer Assistance: None activities of daily living that may affect Patient Identification Verified: Yes risk of falls: Secondary Verification Process Completed: Yes Signs or symptoms of abuse/neglect since last visito No Patient Requires Transmission-Based Precautions: No Hospitalized since last visit: No Patient Has Alerts: No Implantable device outside of the clinic excluding No cellular tissue based products placed in the center since last visit: Has Dressing in Place as Prescribed: Yes Pain Present Now: No Electronic Signature(s) Signed: 10/04/2020 6:17:43 PM By: Leane Call Entered By: Leane Call on 09/29/2020 13:59:52 -------------------------------------------------------------------------------- Clinic Level of Care Assessment Details Patient Name: Date of Service: [name] 09/29/2020 1:30 PM Medical Record Number: [id number] Patient Account Number: [id number] Date of Birth/Sex: Treating RN: 1970-08-07 (50 y.o. [name], Meta.Reding Primary Care Corrinne Benegas: [name], [name] Other Clinician: Referring Ahmari Garton: Treating Miriam Kestler/Extender: [name], [name] Weeks in Treatment: 108 Clinic Level of  Care Assessment Items TOOL 4 Quantity Score X- 1 0 Use when only an EandM is performed on FOLLOW-UP visit ASSESSMENTS - Nursing Assessment / Reassessment X- 1 10 Reassessment of Co-morbidities (includes updates in patient status) X- 1 5 Reassessment of Adherence to Treatment Plan ASSESSMENTS - Wound and Skin A ssessment / Reassessment []  - 0 Simple Wound Assessment / Reassessment - one wound X- 3 5 Complex Wound Assessment / Reassessment - multiple wounds X- 1 10 Dermatologic / Skin Assessment (not related to wound area) ASSESSMENTS - Focused Assessment X- 2 5 Circumferential Edema Measurements - multi extremities X- 1 10 Nutritional Assessment / Counseling / Intervention []  - 0 Lower Extremity Assessment (monofilament, tuning fork, pulses) []  - 0 Peripheral Arterial Disease Assessment (using hand held doppler) ASSESSMENTS - Ostomy and/or Continence Assessment and Care []  - 0 Incontinence Assessment and Management []  - 0 Ostomy Care Assessment and Management (repouching, etc.) PROCESS - Coordination of Care []  - 0 Simple Patient / Family Education for ongoing care X- 1 20 Complex (extensive) Patient / Family Education for ongoing care X- 1 10 Staff obtains Programmer, systems, Records, T Results / Process Orders est []  - 0 Staff telephones HHA, Nursing Homes / Clarify orders / etc []  - 0 Routine Transfer to another Facility (non-emergent condition) []  - 0 Routine Hospital Admission (non-emergent condition) []  - 0 New Admissions / Biomedical engineer / Ordering NPWT Apligraf, etc. , []  - 0 Emergency Hospital Admission (emergent condition) []  - 0 Simple Discharge Coordination X- 1 15 Complex (extensive) Discharge Coordination PROCESS - Special Needs []  - 0 Pediatric / Minor Patient Management []  - 0 Isolation Patient Management []  - 0 Hearing / Language / Visual special needs []  - 0 Assessment of Community assistance (transportation, D/C planning, etc.) []  -  0 Additional assistance / Altered mentation []  - 0 Support Surface(s) Assessment (bed, cushion, seat, etc.) INTERVENTIONS - Wound Cleansing / Measurement []  -  0 Simple Wound Cleansing - one wound X- 3 5 Complex Wound Cleansing - multiple wounds X- 1 5 Wound Imaging (photographs - any number of wounds) $RemoveBe'[]'bifFJVwXg$  - 0 Wound Tracing (instead of photographs) $RemoveBeforeD'[]'YTjDCsWLunQpHK$  - 0 Simple Wound Measurement - one wound X- 3 5 Complex Wound Measurement - multiple wounds INTERVENTIONS - Wound Dressings X - Small Wound Dressing one or multiple wounds 1 10 $Re'[]'Gxd$  - 0 Medium Wound Dressing one or multiple wounds X- 2 20 Large Wound Dressing one or multiple wounds $RemoveBeforeD'[]'SkiVSNsGDvBxqs$  - 0 Application of Medications - topical $RemoveB'[]'qlqdvCfy$  - 0 Application of Medications - injection INTERVENTIONS - Miscellaneous $RemoveBeforeD'[]'mrIIhhpbiJqggh$  - 0 External ear exam $Remove'[]'oGWzhJj$  - 0 Specimen Collection (cultures, biopsies, blood, body fluids, etc.) $RemoveBefor'[]'KjSgWpctJFol$  - 0 Specimen(s) / Culture(s) sent or taken to Lab for analysis $RemoveBefo'[]'zhEbzpJMfUf$  - 0 Patient Transfer (multiple staff / Civil Service fast streamer / Similar devices) $RemoveBeforeDE'[]'JzxywonBnApiXjW$  - 0 Simple Staple / Suture removal (25 or less) $Remove'[]'mjzVowk$  - 0 Complex Staple / Suture removal (26 or more) $Remove'[]'GdnBXmF$  - 0 Hypo / Hyperglycemic Management (close monitor of Blood Glucose) $RemoveBefore'[]'RRESmNqHBkQzf$  - 0 Ankle / Brachial Index (ABI) - do not check if billed separately X- 1 5 Vital Signs Has the patient been seen at the hospital within the last three years: Yes Total Score: 195 Level Of Care: New/Established - Level 5 Electronic Signature(s) Signed: 09/29/2020 6:57:58 PM By: Deon Pilling Entered By: Deon Pilling on 09/29/2020 14:32:29 -------------------------------------------------------------------------------- Encounter Discharge Information Details Patient Name: Date of Service: [name] 09/29/2020 1:30 PM Medical Record Number: [id number] Patient Account Number: [id number] Date of Birth/Sex: Treating RN: Jun 10, 1970 (50 y.o. Nita Sells Primary Care Doryan Bahl: [name], [name] Other  Clinician: Referring Ritik Stavola: Treating Brodrick Curran/Extender: [name], [name] Weeks in Treatment: 108 Encounter Discharge Information Items Discharge Condition: Stable Ambulatory Status: Wheelchair Discharge Destination: Home Transportation: Private Auto Accompanied By: alone Schedule Follow-up Appointment: No Clinical Summary of Care: Electronic Signature(s) Signed: 10/04/2020 6:17:43 PM By: Leane Call Entered By: Leane Call on 09/29/2020 16:14:51 -------------------------------------------------------------------------------- Lower Extremity Assessment Details Patient Name: Date of Service: Lorelee New [name] 09/29/2020 1:30 PM Medical Record Number: [id number] Patient Account Number: [id number] Date of Birth/Sex: Treating RN: 1970/12/25 (50 y.o. Nita Sells Primary Care Chasady Longwell: [name], [name] Other Clinician: Referring Irine Heminger: Treating Jesus Nevills/Extender: [name], [name] Weeks in Treatment: 108 Edema Assessment Assessed: [Left: No] [Right: No] Edema: [Left: No] [Right: No] Calf Left: Right: Point of Measurement: 30 cm From Medial Instep 31 cm 28 cm Ankle Left: Right: Point of Measurement: 9 cm From Medial Instep 19.5 cm 18 cm Vascular Assessment Pulses: Dorsalis Pedis Palpable: [Left:Yes] [Right:Yes] Electronic Signature(s) Signed: 10/04/2020 6:17:43 PM By: Leane Call Entered By: Leane Call on 09/29/2020 14:02:53 -------------------------------------------------------------------------------- Multi Wound Chart Details Patient Name: Date of Service: [name] 09/29/2020 1:30 PM Medical Record Number: [id number] Patient Account Number: [id number] Date of Birth/Sex: Treating RN: 01-27-1971 (50 y.o. [name] Primary Care Naim Murtha: [name], [name] Other Clinician: Referring Bryler Dibble: Treating Kem Hensen/Extender: [name], [name] Weeks in Treatment: 108 Vital  Signs Height(in): 67 Pulse(bpm): 91 Weight(lbs): 125 Blood Pressure(mmHg): 156/82 Body Mass Index(BMI): 20 Temperature(F): 98.8 Respiratory Rate(breaths/min): 18 Photos: [50:No Photos Left, Plantar Foot] [51:No Photos Left T Second oe] [52:No Photos Right Foot] Wound Location: [50:Skin T ear/Laceration] [51:Trauma] [52:Gradually Appeared] Wounding Event: [50:Diabetic Wound/Ulcer of the Lower] [51:Diabetic Wound/Ulcer of the Lower] [52:Diabetic Wound/Ulcer of  the Lower] Primary Etiology: [50:Extremity Cataracts, Chronic sinus] [51:Extremity Cataracts, Chronic sinus] [52:Extremity Cataracts, Chronic sinus] Comorbid History: [50:problems/congestion, Anemia, Sleep problems/congestion, Anemia, Sleep problems/congestion, Anemia, Sleep Apnea, Deep Vein Thrombosis, Hypertension, Peripheral Arterial Disease, Type I Diabetes, Osteoarthritis, Osteomyelitis,  Neuropathy, Seizure Disorder 06/24/2020] [51:Apnea, Deep Vein Thrombosis, Hypertension, Peripheral Arterial Disease, Type I Diabetes, Osteoarthritis, Osteomyelitis, Neuropathy, Seizure Disorder 07/29/2020] [52:Apnea, Deep Vein Thrombosis, Hypertension,  Peripheral Arterial Disease, Type I Diabetes, Osteoarthritis, Osteomyelitis, Neuropathy, Seizure Disorder 09/02/2020] Date Acquired: [50:13] [51:8] [52:3] Weeks of Treatment: [50:Open] [51:Open] [52:Open] Wound Status: [50:No] [51:No] [52:Yes] Clustered Wound: [50:N/A] [51:N/A] [52:3] Clustered Quantity: [50:0.1x0.1x0.1] [51:0.2x0.6x0.1] [52:1.1x3.8x0.1] Measurements L x W x D (cm) [50:0.008] [51:0.094] [52:3.283] A (cm) : rea [50:0.001] [51:0.009] [52:0.328] Volume (cm) : [50:95.90%] [51:92.00%] [52:-5.60%] % Reduction in A rea: [50:97.40%] [51:97.50%] [52:47.30%] % Reduction in Volume: [50:Grade 1] [51:Grade 2] [52:Grade 2] Classification: [50:Small] [51:Small] [52:Medium] Exudate A mount: [50:Serosanguineous] [51:Serosanguineous] [52:Serous] Exudate Type: [50:red, brown] [51:red, brown]  [52:amber] Exudate Color: [50:Well defined, not attached] [51:Distinct, outline attached] [52:Distinct, outline attached] Wound Margin: [50:Large (67-100%)] [51:Large (67-100%)] [52:Small (1-33%)] Granulation A mount: [50:Red] [51:Red] [52:Pink] Granulation Quality: [50:None Present (0%)] [51:None Present (0%)] [52:None Present (0%)] Necrotic A mount: [50:Fat Layer (Subcutaneous Tissue): Yes Fat Layer (Subcutaneous Tissue): Yes Fat Layer (Subcutaneous Tissue): Yes] Exposed Structures: [50:Fascia: No Tendon: No Muscle: No Joint: No Bone: No Large (67-100%)] [51:Fascia: No Tendon: No Muscle: No Joint: No Bone: No Medium (34-66%)] [52:Fascia: No Tendon: No Muscle: No Joint: No Bone: No Small (1-33%)] Treatment Notes Electronic Signature(s) Signed: 09/29/2020 5:19:12 PM By: Linton Ham MD Signed: 09/29/2020 6:57:58 PM By: Deon Pilling Entered By: Linton Ham on 09/29/2020 14:45:58 -------------------------------------------------------------------------------- Multi-Disciplinary Care Plan Details Patient Name: Date of Service: [place] LLO [address] [name] 09/29/2020 1:30 PM Medical Record Number: [id number] Patient Account Number: [id number] Date of Birth/Sex: Treating RN: May 09, 1970 (50 y.o. [name] Primary Care Tae Vonada: [name], [name] Other Clinician: Referring Melvyn Hommes: Treating Karolyne Timmons/Extender: [name], [name] Weeks in Treatment: [place] reviewed with physician Active Inactive Nutrition Nursing Diagnoses: Imbalanced nutrition Impaired glucose control: actual or potential Potential for alteratiion in Nutrition/Potential for imbalanced nutrition Goals: Patient/caregiver agrees to and verbalizes understanding of need to use nutritional supplements and/or vitamins as prescribed Date Initiated: 09/03/2018 Date Inactivated: 10/07/2018 Target Resolution Date: 10/01/2018 Goal Status: Met Patient/caregiver will maintain therapeutic  glucose control Date Initiated: 09/03/2018 Target Resolution Date: 10/28/2020 Goal Status: Active Interventions: Provide education on elevated blood sugars and impact on wound healing Treatment Activities: Patient referred to Primary Care Physician for further nutritional evaluation : 09/03/2018 Notes: 07/29/20: Glucose control ongoing, target date extended. Wound/Skin Impairment Nursing Diagnoses: Impaired tissue integrity Knowledge deficit related to ulceration/compromised skin integrity Goals: Patient/caregiver will verbalize understanding of skin care regimen Date Initiated: 09/03/2018 Target Resolution Date: 10/27/2020 Goal Status: Active Ulcer/skin breakdown will have a volume reduction of 30% by week 4 Date Initiated: 09/03/2018 Date Inactivated: 10/07/2018 Target Resolution Date: 10/01/2018 Goal Status: Unmet Unmet Reason: COMORBITIES Ulcer/skin breakdown will have a volume reduction of 50% by week 8 Date Initiated: 10/07/2018 Date Inactivated: 11/03/2018 Target Resolution Date: 10/31/2018 Goal Status: Unmet Unmet Reason: Osteomyelitis Interventions: Assess patient/caregiver ability to obtain necessary supplies Assess patient/caregiver ability to perform ulcer/skin care regimen upon admission and as needed Assess ulceration(s) every visit Provide education on ulcer and skin care Treatment Activities: Skin care regimen initiated : 09/03/2018 Topical wound management initiated : 09/03/2018 Notes: Electronic Signature(s) Signed: 09/29/2020 6:57:58 PM By: Deon Pilling Entered  By: Deon Pilling on 09/29/2020 13:41:30 -------------------------------------------------------------------------------- Pain Assessment Details Patient Name: Date of Service: [name] 09/29/2020 1:30 PM Medical Record Number: [id number] Patient Account Number: [id number] Date of Birth/Sex: Treating RN: 02/17/1971 (50 y.o. Nita Sells Primary Care Lantz Hermann: [name], [name] Other  Clinician: Referring Lavere Shinsky: Treating Dariya Gainer/Extender: [name], [name] Weeks in Treatment: 108 Active Problems Location of Pain Severity and Description of Pain Patient Has Paino No Site Locations Rate the pain. Current Pain Level: 0 Pain Management and Medication Current Pain Management: Electronic Signature(s) Signed: 10/04/2020 6:17:43 PM By: Leane Call Entered By: Leane Call on 09/29/2020 14:00:35 -------------------------------------------------------------------------------- Patient/Caregiver Education Details Patient Name: Date of Service: [place] LLO Peggye Fothergill 6/30/2022andnbsp1:30 PM Medical Record Number: [id number] Patient Account Number: [id number] Date of Birth/Gender: Treating RN: 1970-06-15 (50 y.o. [name] Primary Care Physician: [name], [name] Other Clinician: Referring Physician: Treating Physician/Extender: [name], [name] Weeks in Treatment: 108 Education Assessment Education Provided To: Patient Education Topics Provided Elevated Blood Sugar/ Impact on Healing: Handouts: Elevated Blood Sugars: How Do They Affect Wound Healing Methods: Explain/Verbal Responses: Reinforcements needed Electronic Signature(s) Signed: 09/29/2020 6:57:58 PM By: Deon Pilling Entered By: Deon Pilling on 09/29/2020 13:41:48 -------------------------------------------------------------------------------- Wound Assessment Details Patient Name: Date of Service: Lorelee New [name] 09/29/2020 1:30 PM Medical Record Number: [id number] Patient Account Number: [id number] Date of Birth/Sex: Treating RN: [birth year] (50 y.o. Nita Sells Primary Care Tanee Henery: [name], [name] Other Clinician: Referring Danea Manter: Treating Detta Mellin/Extender: [name], [name] Weeks in Treatment: 108 Wound Status Wound Number: 101 Primary Diabetic Wound/Ulcer of the Lower Extremity Etiology: Wound Location:  Left, Plantar Foot Wound Open Wounding Event: Skin Tear/Laceration Status: Date Acquired: 06/24/2020 Comorbid Cataracts, Chronic sinus problems/congestion, Anemia, Sleep Weeks Of Treatment: 13 History: Apnea, Deep Vein Thrombosis, Hypertension, Peripheral Arterial Clustered Wound: No Disease, Type I Diabetes, Osteoarthritis, Osteomyelitis, Neuropathy, Seizure Disorder Photos Wound Measurements Length: (cm) 0.1 Width: (cm) 0.1 Depth: (cm) 0.1 Area: (cm) 0.008 Volume: (cm) 0.001 % Reduction in Area: 95.9% % Reduction in Volume: 97.4% Epithelialization: Large (67-100%) Tunneling: No Undermining: No Wound Description Classification: Grade 1 Wound Margin: Well defined, not attached Exudate Amount: Small Exudate Type: Serosanguineous Exudate Color: red, brown Foul Odor After Cleansing: No Slough/Fibrino No Wound Bed Granulation Amount: Large (67-100%) Exposed Structure Granulation Quality: Red Fascia Exposed: No Necrotic Amount: None Present (0%) Fat Layer (Subcutaneous Tissue) Exposed: Yes Tendon Exposed: No Muscle Exposed: No Joint Exposed: No Bone Exposed: No Treatment Notes Wound #50 (Foot) Wound Laterality: Plantar, Left Cleanser Wound Cleanser Discharge Instruction: Cleanse the wound with wound cleanser or normal saline prior to applying a clean dressing using gauze sponges, not tissue or cotton balls. Peri-Wound Care Topical Primary Dressing KerraCel Ag Gelling Fiber Dressing, 2x2 in (silver alginate) Discharge Instruction: Apply silver alginate to wound bed as instructed Secondary Dressing Woven Gauze Sponge, Non-Sterile 4x4 in Discharge Instruction: Apply over primary dressing as directed. Secured With Elastic Bandage 4 inch (ACE bandage) Discharge Instruction: Secure with ACE bandage as directed. Kerlix Roll Sterile, 4.5x3.1 (in/yd) Discharge Instruction: Secure with Kerlix as directed. Paper Tape, 2x10 (in/yd) Discharge Instruction: Secure dressing  with tape as directed. Compression Wrap Compression Stockings Add-Ons Electronic Signature(s) Signed: 09/30/2020 10:25:49 AM By: Sandre Kitty Signed: 10/04/2020 6:17:43 PM By: Leane Call Entered By: Sandre Kitty on 09/29/2020 17:08:23 -------------------------------------------------------------------------------- Wound Assessment Details Patient Name: Date of Service: [place] LLO [place] Y,  [name] 09/29/2020 1:30 PM Medical Record Number: [id number] Patient Account Number: [id number] Date of Birth/Sex: Treating RN: 1970/04/10 (50 y.o. Nita Sells Primary Care Florentine Diekman: [name], [name] Other Clinician: Referring Cassandra Harbold: Treating Edra Riccardi/Extender: [name], [name] Weeks in Treatment: 108 Wound Status Wound Number: 51 Primary Diabetic Wound/Ulcer of the Lower Extremity Etiology: Wound Location: Left T Second oe Wound Open Wounding Event: Trauma Status: Date Acquired: 07/29/2020 Comorbid Cataracts, Chronic sinus problems/congestion, Anemia, Sleep Weeks Of Treatment: 8 History: Apnea, Deep Vein Thrombosis, Hypertension, Peripheral Arterial Clustered Wound: No Disease, Type I Diabetes, Osteoarthritis, Osteomyelitis, Neuropathy, Seizure Disorder Photos Wound Measurements Length: (cm) 0.2 Width: (cm) 0.6 Depth: (cm) 0.1 Area: (cm) 0.094 Volume: (cm) 0.009 % Reduction in Area: 92% % Reduction in Volume: 97.5% Epithelialization: Medium (34-66%) Tunneling: No Undermining: No Wound Description Classification: Grade 2 Wound Margin: Distinct, outline attached Exudate Amount: Small Exudate Type: Serosanguineous Exudate Color: red, brown Foul Odor After Cleansing: No Slough/Fibrino No Wound Bed Granulation Amount: Large (67-100%) Exposed Structure Granulation Quality: Red Fascia Exposed: No Necrotic Amount: None Present (0%) Fat Layer (Subcutaneous Tissue) Exposed: Yes Tendon Exposed: No Muscle Exposed: No Joint Exposed: No Bone  Exposed: No Treatment Notes Wound #51 (Toe Second) Wound Laterality: Left Cleanser Soap and Water Discharge Instruction: May shower and wash wound with dial antibacterial soap and water prior to dressing change. Peri-Wound Care Topical Primary Dressing KerraCel Ag Gelling Fiber Dressing, 4x5 in (silver alginate) Discharge Instruction: Apply silver alginate to wound bed as instructed Secondary Dressing Woven Gauze Sponges 2x2 in Discharge Instruction: Apply over primary dressing as directed. Secured With Conforming Stretch Gauze Bandage, Sterile 2x75 (in/in) Discharge Instruction: Secure with stretch gauze as directed. Paper Tape, 1x10 (in/yd) Discharge Instruction: Secure dressing with tape as directed. Compression Wrap Compression Stockings Add-Ons Electronic Signature(s) Signed: 09/30/2020 10:25:49 AM By: Sandre Kitty Signed: 10/04/2020 6:17:43 PM By: Leane Call Entered By: Sandre Kitty on 09/29/2020 17:07:58 -------------------------------------------------------------------------------- Wound Assessment Details Patient Name: Date of Service: [name] 09/29/2020 1:30 PM Medical Record Number: [id number] Patient Account Number: [id number] Date of Birth/Sex: Treating RN: 1970/09/13 (50 y.o. Nita Sells Primary Care Sascha Palma: [name], [name] Other Clinician: Referring Kaelin Bonelli: Treating Yandiel Bergum/Extender: [name], [name] Weeks in Treatment: 108 Wound Status Wound Number: 52 Primary Diabetic Wound/Ulcer of the Lower Extremity Etiology: Wound Location: Right Foot Wound Open Wounding Event: Gradually Appeared Status: Date Acquired: 09/02/2020 Comorbid Cataracts, Chronic sinus problems/congestion, Anemia, Sleep Weeks Of Treatment: 3 History: Apnea, Deep Vein Thrombosis, Hypertension, Peripheral Arterial Clustered Wound: Yes Disease, Type I Diabetes, Osteoarthritis, Osteomyelitis, Neuropathy, Seizure  Disorder Photos Wound Measurements Length: (cm) 1.1 Width: (cm) 3.8 Depth: (cm) 0.1 Clustered Quantity: 3 Area: (cm) 3.283 Volume: (cm) 0.328 % Reduction in Area: -5.6% % Reduction in Volume: 47.3% Epithelialization: Small (1-33%) Tunneling: No Undermining: No Wound Description Classification: Grade 2 Wound Margin: Distinct, outline attached Exudate Amount: Medium Exudate Type: Serous Exudate Color: amber Foul Odor After Cleansing: No Slough/Fibrino Yes Wound Bed Granulation Amount: Small (1-33%) Exposed Structure Granulation Quality: Pink Fascia Exposed: No Necrotic Amount: None Present (0%) Fat Layer (Subcutaneous Tissue) Exposed: Yes Tendon Exposed: No Muscle Exposed: No Joint Exposed: No Bone Exposed: No Treatment Notes Wound #52 (Foot) Wound Laterality: Right Cleanser Soap and Water Discharge Instruction: May shower and wash wound with dial antibacterial soap and water prior to dressing change. Peri-Wound Care Topical Primary Dressing Promogran Prisma Matrix, 4.34 (sq in) (silver collagen) Discharge Instruction: Moisten collagen  with saline or hydrogel Secondary Dressing Woven Gauze Sponges 2x2 in Discharge Instruction: Apply over primary dressing as directed. Secured With Elastic Bandage 4 inch (ACE bandage) Discharge Instruction: Secure with ACE bandage as directed. Conforming Stretch Gauze Bandage, Sterile 2x75 (in/in) Discharge Instruction: Secure with stretch gauze as directed. Paper Tape, 1x10 (in/yd) Discharge Instruction: Secure dressing with tape as directed. Compression Wrap Compression Stockings Add-Ons Electronic Signature(s) Signed: 09/30/2020 10:25:49 AM By: Sandre Kitty Signed: 10/04/2020 6:17:43 PM By: Leane Call Entered By: Sandre Kitty on 09/29/2020 17:07:34 -------------------------------------------------------------------------------- Vitals Details Patient Name: Date of Service: [name] 09/29/2020  1:30 PM Medical Record Number: [id number] Patient Account Number: [id number] Date of Birth/Sex: Treating RN: Jan 23, 1971 (50 y.o. Nita Sells Primary Care Marvin Grabill: [name], [name] Other Clinician: Referring Saavi Mceachron: Treating Emani Taussig/Extender: [name], [name] Weeks in Treatment: 108 Vital Signs Time Taken: 13:50 Temperature (F): 98.8 Height (in): 67 Pulse (bpm): 91 Weight (lbs): 125 Respiratory Rate (breaths/min): 18 Body Mass Index (BMI): 19.6 Blood Pressure (mmHg): 156/82 Reference Range: 80 - 120 mg / dl Airway Pulse Oximetry (%): 98 Electronic Signature(s) Signed: 10/04/2020 6:17:43 PM By: Leane Call Entered By: Leane Call on 09/29/2020 14:00:20

## 2020-10-08 ENCOUNTER — Encounter (HOSPITAL_COMMUNITY): Payer: Self-pay | Admitting: *Deleted

## 2020-10-08 ENCOUNTER — Emergency Department (HOSPITAL_COMMUNITY)
Admission: EM | Admit: 2020-10-08 | Discharge: 2020-10-08 | Disposition: A | Discharge status: Home / Self Care | Payer: Medicare HMO | Attending: Emergency Medicine | Admitting: Emergency Medicine

## 2020-10-08 ENCOUNTER — Other Ambulatory Visit: Payer: Self-pay

## 2020-10-08 DIAGNOSIS — I120 Hypertensive chronic kidney disease with stage 5 chronic kidney disease or end stage renal disease: Secondary | ICD-10-CM | POA: Diagnosis not present

## 2020-10-08 DIAGNOSIS — R5381 Other malaise: Secondary | ICD-10-CM | POA: Diagnosis not present

## 2020-10-08 DIAGNOSIS — R42 Dizziness and giddiness: Secondary | ICD-10-CM | POA: Diagnosis present

## 2020-10-08 DIAGNOSIS — Z794 Long term (current) use of insulin: Secondary | ICD-10-CM | POA: Diagnosis not present

## 2020-10-08 DIAGNOSIS — E10621 Type 1 diabetes mellitus with foot ulcer: Secondary | ICD-10-CM | POA: Insufficient documentation

## 2020-10-08 DIAGNOSIS — R112 Nausea with vomiting, unspecified: Secondary | ICD-10-CM | POA: Diagnosis not present

## 2020-10-08 DIAGNOSIS — L97529 Non-pressure chronic ulcer of other part of left foot with unspecified severity: Secondary | ICD-10-CM | POA: Insufficient documentation

## 2020-10-08 DIAGNOSIS — Z20822 Contact with and (suspected) exposure to covid-19: Secondary | ICD-10-CM | POA: Diagnosis not present

## 2020-10-08 DIAGNOSIS — E1022 Type 1 diabetes mellitus with diabetic chronic kidney disease: Secondary | ICD-10-CM | POA: Diagnosis not present

## 2020-10-08 DIAGNOSIS — Z992 Dependence on renal dialysis: Secondary | ICD-10-CM | POA: Insufficient documentation

## 2020-10-08 DIAGNOSIS — N186 End stage renal disease: Secondary | ICD-10-CM | POA: Diagnosis not present

## 2020-10-08 DIAGNOSIS — Z89422 Acquired absence of other left toe(s): Secondary | ICD-10-CM | POA: Diagnosis not present

## 2020-10-08 DIAGNOSIS — Z7982 Long term (current) use of aspirin: Secondary | ICD-10-CM | POA: Insufficient documentation

## 2020-10-08 DIAGNOSIS — Z87891 Personal history of nicotine dependence: Secondary | ICD-10-CM | POA: Insufficient documentation

## 2020-10-08 DIAGNOSIS — E1059 Type 1 diabetes mellitus with other circulatory complications: Secondary | ICD-10-CM | POA: Insufficient documentation

## 2020-10-08 DIAGNOSIS — R6883 Chills (without fever): Secondary | ICD-10-CM | POA: Insufficient documentation

## 2020-10-08 DIAGNOSIS — D631 Anemia in chronic kidney disease: Secondary | ICD-10-CM | POA: Diagnosis not present

## 2020-10-08 LAB — URINALYSIS, ROUTINE W REFLEX MICROSCOPIC
Bacteria, UA: NONE SEEN
Bilirubin Urine: NEGATIVE
Glucose, UA: >=500 mg/dL — AB
Hgb urine dipstick: NEGATIVE
Ketones, ur: NEGATIVE mg/dL
Leukocytes,Ua: NEGATIVE
Nitrite: NEGATIVE
Protein, ur: 100 mg/dL — AB
Specific Gravity, Urine: 1.009 (ref 1.005–1.030)
pH: 9 — ABNORMAL HIGH (ref 5.0–8.0)

## 2020-10-08 LAB — RESP PANEL BY RT-PCR (FLU A&B, COVID) ARPGX2
Influenza A by PCR: NEGATIVE
Influenza B by PCR: NEGATIVE
SARS Coronavirus 2 by RT PCR: NEGATIVE

## 2020-10-08 LAB — COMPREHENSIVE METABOLIC PANEL
ALT: 31 U/L (ref 0–44)
AST: 29 U/L (ref 15–41)
Albumin: 3.5 g/dL (ref 3.5–5.0)
Alkaline Phosphatase: 180 U/L — ABNORMAL HIGH (ref 38–126)
Anion gap: 14 (ref 5–15)
BUN: 45 mg/dL — ABNORMAL HIGH (ref 6–20)
CO2: 25 mmol/L (ref 22–32)
Calcium: 8.7 mg/dL — ABNORMAL LOW (ref 8.9–10.3)
Chloride: 96 mmol/L — ABNORMAL LOW (ref 98–111)
Creatinine, Ser: 6.71 mg/dL — ABNORMAL HIGH (ref 0.44–1.00)
GFR, Estimated: 7 mL/min — ABNORMAL LOW (ref >60)
Glucose, Bld: 164 mg/dL — ABNORMAL HIGH (ref 70–99)
Potassium: 4.7 mmol/L (ref 3.5–5.1)
Sodium: 135 mmol/L (ref 135–145)
Total Bilirubin: 0.7 mg/dL (ref 0.3–1.2)
Total Protein: 7.5 g/dL (ref 6.5–8.1)

## 2020-10-08 LAB — I-STAT BETA HCG BLOOD, ED (MC, WL, AP ONLY): I-stat hCG, quantitative: <5 m[IU]/mL (ref <5)

## 2020-10-08 LAB — CBC
HCT: 32.5 % — ABNORMAL LOW (ref 36.0–46.0)
Hemoglobin: 10.5 g/dL — ABNORMAL LOW (ref 12.0–15.0)
MCH: 30.5 pg (ref 26.0–34.0)
MCHC: 32.3 g/dL (ref 30.0–36.0)
MCV: 94.5 fL (ref 80.0–100.0)
Platelets: 426 10*3/uL — ABNORMAL HIGH (ref 150–400)
RBC: 3.44 MIL/uL — ABNORMAL LOW (ref 3.87–5.11)
RDW: 14.6 % (ref 11.5–15.5)
WBC: 7 10*3/uL (ref 4.0–10.5)
nRBC: 0 % (ref 0.0–0.2)

## 2020-10-08 LAB — LIPASE, BLOOD: Lipase: 41 U/L (ref 11–51)

## 2020-10-08 MED ORDER — ACETAMINOPHEN 325 MG PO TABS
650.0000 mg | ORAL_TABLET | Freq: Once | ORAL | Status: AC
  Administered 2020-10-08: 650 mg via ORAL
  Filled 2020-10-08: qty 2

## 2020-10-08 MED ORDER — ONDANSETRON 4 MG PO TBDP: 4.0000 mg | ORAL_TABLET | Freq: Three times a day (TID) | ORAL | 0 refills | Status: AC | PRN

## 2020-10-08 NOTE — ED Notes (Signed)
Pt given water for fluid challenge. Pt tolerating well. Will continue to monitor.

## 2020-10-08 NOTE — ED Provider Notes (Signed)
[facility] EMERGENCY DEPARTMENT Provider Note   CSN: [id number] Arrival date & time: 10/08/20  0435     History Chief Complaint  Patient presents with   Emesis    [name] is a 50 y.o. female.  Patient presents ER chief complaint of lightheadedness dizziness chills generalized malaise.  Symptoms been ongoing since 3 days now.  She is due for dialysis this morning but felt too lightheaded and presents to the ER instead.  Denies any diarrhea.  Denies any headache or chest pain.  Denies abdominal pain.      Past Medical History:  Diagnosis Date   Anemia    Arthritis    Bronchitis    C. difficile diarrhea 09/26/2014   Cataracts, both eyes    had surgery to remove   Cellulitis of right foot 06/02/2014   CKD (chronic kidney disease) stage 3, GFR 30-59 ml/min (HCC)    Dialysis T, TH, Sat   Diabetic ulcer of right foot ([place]) 06/02/2014   DVT (deep venous thrombosis) ([place]) 10/2014   one in each one in leg- PICC line , one in right and left arm   H/O seasonal allergies    History of blood transfusion    Hypercholesteremia    Hypertension    Left foot infection    Neuromuscular disorder (HCC)    lower legs and feet   Neuropathy in diabetes [facility])    Peripheral vascular disease ([place])    Sleep apnea    does not use cpap   Staphylococcus aureus bacteremia 10/2014   Type 1 diabetes [facility])    onset age 43    Patient Active Problem List   Diagnosis Date Noted   Precordial chest pain 04/27/2020   Allergy, unspecified, initial encounter 11/26/2019   Anaphylactic shock, unspecified, initial encounter 11/26/2019   Encounter for removal of sutures 11/24/2019   Chronic venous hypertension (idiopathic) with ulcer and inflammation of unspecified lower extremity ([place]) 09/08/2019   ESRD (end stage renal disease) on dialysis ([place])    Anemia in chronic kidney disease 08/04/2018   Coagulation defect, unspecified ([place]) 08/04/2018   Secondary hyperparathyroidism of  renal origin ([place]) 08/04/2018   Osteomyelitis of right foot ([place]) 07/29/2018   Menorrhagia with irregular cycle 11/27/2017   Gangrene of left foot ([place]) 05/08/2017   Osteomyelitis of left foot ([place]) 05/08/2017   Diabetic foot infection ([place])    Diabetic foot ulcer ([place]) 05/05/2017   Cellulitis 05/04/2017   Diabetes mellitus type 1 with peripheral artery disease ([place]) 01/16/2017   Anxiety 03/23/2016   Compulsive skin picking 03/23/2016   Sepsis due to methicillin resistant Staphylococcus aureus (MRSA) without acute organ dysfunction ([place]) 11/04/2015   Heart murmur 11/03/2015   Acute renal failure superimposed on stage 4 chronic kidney disease ([place]) 05/31/2015   PAD (peripheral artery disease) ([place]) 04/22/2015   Peripheral edema 12/28/2014   Protein-calorie malnutrition, moderate ([place]) 10/18/2014   History of DVT (deep vein thrombosis)    MRSA bacteremia    Diabetic infection of left foot ([place]) 10/13/2014   Chronic anemia 10/13/2014   Status post transmetatarsal amputation of right foot ([place]) 09/26/2014   Diabetes mellitus type 1, uncontrolled, insulin dependent ([place]) 11/09/2013   DM type 1 causing renal disease ([place]) 06/13/2013   CKD (chronic kidney disease), stage III ([place]) 06/12/2013   Charcot foot due to diabetes mellitus ([place]) 05/09/2011   Foot ulcer due to secondary DM ([place]) 05/09/2011   Type 1 diabetes mellitus with neurological manifestations, uncontrolled ([place])  Hypertension    Hypercholesteremia     Past Surgical History:  Procedure Laterality Date   AMPUTATION TOE Left 07/24/2015   5th toe and Hallux amputation   AV FISTULA PLACEMENT Left 08/06/2018   Procedure: INSERTION OF BASCILIC VEN TRANSPOSITION 1ST STAGE;  Surgeon: Rosetta Posner, MD;  Location: [facility];  Service: Vascular;  Laterality: Left;   [place] Left 04/29/2019   Procedure: SECOND STAGE BASCILIC VEIN TRANSPOSITION LEFT ARM;  Surgeon: Rosetta Posner, MD;  Location: [facility];  Service: Vascular;   Laterality: Left;   CESAREAN SECTION     x 1   EXOSTECTECTOMY TOE Left 04/11/2016   Procedure: EXOSTECTECTOMY CUBOID AND 5TH METATARSAL AND PLACEMENT OF ANTIBIOTIC BEADS;  Surgeon: Edrick Kins, DPM;  Location: [place];  Service: Podiatry;  Laterality: Left;   EYE SURGERY Bilateral    removed cataracts - laser   FEMORAL-POPLITEAL BYPASS GRAFT Left 05/03/2015   FOOT AMPUTATION THROUGH METATARSAL Right 2016   hemrrhoidectomy     I & D EXTREMITY Right 06/10/2014   Procedure: IRRIGATION AND DEBRIDEMENT Right Foot;  Surgeon: Newt Minion, MD;  Location: [place];  Service: Orthopedics;  Laterality: Right;   I & D EXTREMITY Right 07/31/2018   Procedure: IRRIGATION AND DEBRIDEMENT EXTREMITY;  Surgeon: Edrick Kins, DPM;  Location: [place];  Service: Podiatry;  Laterality: Right;   I & D EXTREMITY Right 08/04/2018   Procedure: IRRIGATION AND DEBRIDEMENT FOOT with placement of wound vac;  Surgeon: Edrick Kins, DPM;  Location: [place];  Service: Podiatry;  Laterality: Right;   INCISION AND DRAINAGE Left 04/11/2016   Procedure: INCISION AND DRAINAGE A DEEP COMPLICATED WOUND OF LEFT FOOT;  Surgeon: Edrick Kins, DPM;  Location: [place];  Service: Podiatry;  Laterality: Left;   INSERTION OF DIALYSIS CATHETER Right 08/06/2018   Procedure: INSERTION OF TUNNELED DIALYSIS CATHETER;  Surgeon: Rosetta Posner, MD;  [facility];  Service: Vascular;  Laterality: Right;   IR FLUORO GUIDE CV LINE RIGHT  09/24/2017   IR FLUORO GUIDE CV LINE RIGHT  08/01/2018   IR REMOVAL TUN CV CATH W/O FL  11/22/2017   IR US GUIDE VASC ACCESS RIGHT  09/24/2017   IR US GUIDE VASC ACCESS RIGHT  08/01/2018   IRRIGATION AND DEBRIDEMENT ABSCESS Left 05/06/2017   Procedure: IRRIGATION AND DEBRIDEMENT ABSCESS LEFT FOOT;  Surgeon: Edrick Kins, DPM;  Location: [facility];  Service: Podiatry;  Laterality: Left;   PERIPHERAL VASCULAR BALLOON ANGIOPLASTY Left 01/20/2020   Procedure: PERIPHERAL VASCULAR BALLOON ANGIOPLASTY;  Surgeon: Marty Heck,  MD;  Location: [place] [facility];  Service: Cardiovascular;  Laterality: Left;  Arm fistula   PERIPHERALLY INSERTED CENTRAL CATHETER INSERTION     SKIN GRAFT Right 06/15/2014   SKIN GRAFT Left 06/2015   foot    SKIN SPLIT GRAFT Right 06/15/2014   Procedure: SPLIT THICKNESS SKIN GRAFT RIGHT FOOT;  Surgeon: Newt Minion, MD;  Location: [place];  Service: Orthopedics;  Laterality: Right;   TRANSMETATARSAL AMPUTATION Right    TRANSMETATARSAL AMPUTATION Right 09/11/2014   WISDOM TOOTH EXTRACTION       OB History     Gravida  1   Para      Term      Preterm      AB      Living         SAB      IAB      Ectopic  Multiple      Live Births              Family History  Problem Relation Age of Onset   Hyperlipidemia Mother    Dementia Mother     Social History   Tobacco Use   Smoking status: Former    Years: 15.00    Pack years: 0.00    Types: Cigarettes    Quit date: 05/16/2008    Years since quitting: 12.4   Smokeless tobacco: Never  Vaping Use   Vaping Use: Never used  Substance Use Topics   Alcohol use: Not Currently    Comment: Occasional   Drug use: No    Home Medications Prior to Admission medications   Medication Sig Start Date End Date Taking? Authorizing Provider  albuterol (PROVENTIL HFA;VENTOLIN HFA) 108 (90 BASE) MCG/ACT inhaler Inhale 2 puffs into the lungs every 6 (six) hours as needed for wheezing or shortness of breath.     [provider]  aspirin EC 81 MG tablet Take 81 mg by mouth daily.    [provider]  B Complex-C-Zn-Folic Acid (DIALYVITE 353 WITH ZINC) 0.8 MG TABS Take 1 tablet by mouth daily. 03/09/19   [provider]  calcitRIOL (ROCALTROL) 0.5 MCG capsule Take 1 capsule daily 08/13/17   Elayne Snare, MD  calcium acetate (PHOSLO) 667 MG capsule Take 2 capsules (1,334 mg total) by mouth 3 (three) times daily with meals. 08/08/18   Danford, Suann Larry, MD  Continuous Blood Gluc Receiver ([place]) [place] 1 each by Does not apply route See admin instructions. Use Dexcom Receiver to monitor blood sugar continuously. 05/25/19   Elayne Snare, MD  Continuous Blood Gluc Sensor (DEXCOM G6 SENSOR) MISC 1 each by Does not apply route See admin instructions. Use one sensors once every 10 days to monitor blood sugars. 05/25/19   Elayne Snare, MD  Continuous Blood Gluc Transmit (DEXCOM G6 TRANSMITTER) MISC 1 each by Does not apply route See admin instructions. Use one transmitter once every 90 days. 05/25/19   Elayne Snare, MD  CONTOUR NEXT TEST test strip USE AS INSTRUCTED TO CHECK BLOOD SUGAR 4 TIMES A DAY. DX E10.65 06/08/19   Elayne Snare, MD  Cyanocobalamin (B-12) 5000 MCG CAPS Take 5,000 mcg by mouth daily.    [provider]  diphenhydrAMINE (BENADRYL) 25 MG tablet Take 25 mg by mouth daily as needed.     [provider]  folic acid (FOLVITE) 614 MCG tablet Take 400 mcg by mouth daily.    [provider]  insulin aspart (NOVOLOG) 100 UNIT/ML injection USE A MAX OF 65 UNITS DAILY VIA INSULIN PUMP 06/24/20   Elayne Snare, MD  Insulin Human (INSULIN PUMP) SOLN Inject into the skin. Insulin aspart (Novolog) 100 unit/ml---MAX 65 UNITS DAILY    [provider]  norethindrone (AYGESTIN) 5 MG tablet Take 5 mg by mouth See admin instructions.  02/17/19   [provider]  Omega-3 Fatty Acids (OMEGA 3 500) 500 MG CAPS Take 500 mg by mouth daily.    [provider]  pravastatin (PRAVACHOL) 40 MG tablet TAKE 1 TABLET BY MOUTH EVERY DAY Patient taking differently: Take 40 mg by mouth daily. 07/12/18   Elayne Snare, MD  sulfamethoxazole-trimethoprim (BACTRIM DS) 800-160 MG tablet Take 1 tablet by mouth daily.    [provider]    Allergies    Cleocin [clindamycin hcl], Lisinopril, Amoxicillin, and Bactrim [sulfamethoxazole-trimethoprim]  Review of Systems   Review  of Systems  Constitutional:  Positive for chills. Negative for fever.  HENT:  Negative  for ear pain.   Eyes:  Negative for pain.  Respiratory:  Negative for cough.   Cardiovascular:  Negative for chest pain.  Gastrointestinal:  Positive for vomiting. Negative for abdominal pain.  Genitourinary:  Negative for flank pain.  Musculoskeletal:  Negative for back pain.  Skin:  Negative for rash.  Neurological:  Negative for headaches.   Physical Exam Updated Vital Signs BP (!) 177/85   Pulse 86   Temp 97.8 F (36.6 C) (Oral)   Resp 14   Ht 5\' 7"  (1.702 m)   Wt 57 kg   SpO2 99%   BMI 19.68 kg/m   Physical Exam Constitutional:      General: She is not in acute distress.    Appearance: Normal appearance.  HENT:     Head: Normocephalic.     Nose: Nose normal.  Eyes:     Extraocular Movements: Extraocular movements intact.  Cardiovascular:     Rate and Rhythm: Normal rate.  Pulmonary:     Effort: Pulmonary effort is normal.  Abdominal:     General: There is no distension.     Tenderness: There is no abdominal tenderness.  Musculoskeletal:        General: Normal range of motion.     Cervical back: Normal range of motion.  Neurological:     General: No focal deficit present.     Mental Status: She is alert. Mental status is at baseline.    ED Results / Procedures / Treatments   Labs (all labs ordered are listed, but only abnormal results are displayed) Labs Reviewed  COMPREHENSIVE METABOLIC PANEL - Abnormal; Notable for the following components:      Result Value   Chloride 96 (*)    Glucose, Bld 164 (*)    BUN 45 (*)    Creatinine, Ser 6.71 (*)    Calcium 8.7 (*)    Alkaline Phosphatase 180 (*)    GFR, Estimated 7 (*)    All other components within normal limits  CBC - Abnormal; Notable for the following components:   RBC 3.44 (*)    Hemoglobin 10.5 (*)    HCT 32.5 (*)    Platelets 426 (*)    All other components within normal limits  URINALYSIS, ROUTINE W REFLEX MICROSCOPIC - Abnormal; Notable for the following components:   pH 9.0 (*)     Glucose, UA >=500 (*)    Protein, ur 100 (*)    All other components within normal limits  RESP PANEL BY RT-PCR (FLU A&B, COVID) ARPGX2  LIPASE, BLOOD  I-STAT BETA HCG BLOOD, ED ([facility], [facility], [facility] ONLY)    EKG EKG Interpretation  Date/Time:  Saturday October 08 2020 06:40:42 EDT Ventricular Rate:  89 PR Interval:  151 QRS Duration: 83 QT Interval:  371 QTC Calculation: 452 R Axis:   73 Text Interpretation: Sinus rhythm Confirmed by Thamas Jaegers ([id number]) on 10/08/2020 6:54:28 AM  Radiology No results found.  Procedures Procedures   Medications Ordered in ED Medications  acetaminophen (TYLENOL) tablet 650 mg (has no administration in time range)    ED Course  I have reviewed the triage vital signs and the nursing notes.  Pertinent labs & imaging results that were available during my care of the patient were reviewed by me and considered in my medical decision making (see chart for details).    MDM Rules/Calculators/A&P  Patient presents with nausea lightheadedness.  She states that she is wheelchair-bound due to history of Charcot foot.  Work-up is otherwise unremarkable labs show normal white count normal chemistry.  Urinalysis negative.  Pregnancy test is negative.  Patient received Zofran 8 mg prior to arrival.  Tolerating p.o. here in the ER.  Will recommend close follow-up with her primary care doctor in 2 to 3 days.  Recommending immediate return for inability keep down any fluids, fevers pain or any additional concerns.  Final Clinical Impression(s) / ED Diagnoses Final diagnoses:  Nausea and vomiting, intractability of vomiting not specified, unspecified vomiting type    Rx / DC Orders ED Discharge Orders     None        Luna Fuse, MD 10/08/20 [phone]

## 2020-10-08 NOTE — Discharge Instructions (Addendum)
Call your primary care doctor or specialist as discussed in the next 2-3 days.    Continue to isolate until your COVID test returns.  You can check on your MyChart app to see your COVID result.  Return immediately back to the ER if:  Your symptoms worsen within the next 12-24 hours. You develop new symptoms such as new fevers, persistent vomiting, new pain, shortness of breath, or new weakness or numbness, or if you have any other concerns.

## 2020-10-08 NOTE — ED Notes (Signed)
[facility] called  

## 2020-10-08 NOTE — ED Triage Notes (Signed)
Patient presents to ed via [facility] states seh started vomiting at dialysis on Thurs. States she gets very dizzy when sitting up .  She is a T-TH-SAT dialysis. States she took 8mg   zofran PTA

## 2020-10-13 ENCOUNTER — Encounter (HOSPITAL_BASED_OUTPATIENT_CLINIC_OR_DEPARTMENT_OTHER): Payer: Medicare HMO | Attending: Internal Medicine | Admitting: Internal Medicine

## 2020-10-20 ENCOUNTER — Encounter (HOSPITAL_BASED_OUTPATIENT_CLINIC_OR_DEPARTMENT_OTHER): Payer: Medicare HMO | Attending: Internal Medicine | Admitting: Internal Medicine

## 2020-10-20 ENCOUNTER — Other Ambulatory Visit (HOSPITAL_COMMUNITY)
Admission: RE | Admit: 2020-10-20 | Discharge: 2020-10-20 | Disposition: A | Discharge status: Home / Self Care | Payer: Medicare HMO | Source: Other Acute Inpatient Hospital | Attending: Internal Medicine | Admitting: Internal Medicine

## 2020-10-20 ENCOUNTER — Other Ambulatory Visit: Payer: Self-pay

## 2020-10-20 DIAGNOSIS — E10621 Type 1 diabetes mellitus with foot ulcer: Secondary | ICD-10-CM | POA: Insufficient documentation

## 2020-10-20 DIAGNOSIS — E1042 Type 1 diabetes mellitus with diabetic polyneuropathy: Secondary | ICD-10-CM | POA: Insufficient documentation

## 2020-10-20 DIAGNOSIS — E1069 Type 1 diabetes mellitus with other specified complication: Secondary | ICD-10-CM | POA: Insufficient documentation

## 2020-10-20 DIAGNOSIS — Z9641 Presence of insulin pump (external) (internal): Secondary | ICD-10-CM | POA: Diagnosis not present

## 2020-10-20 DIAGNOSIS — N186 End stage renal disease: Secondary | ICD-10-CM | POA: Diagnosis not present

## 2020-10-20 DIAGNOSIS — Z87891 Personal history of nicotine dependence: Secondary | ICD-10-CM | POA: Insufficient documentation

## 2020-10-20 DIAGNOSIS — L97514 Non-pressure chronic ulcer of other part of right foot with necrosis of bone: Secondary | ICD-10-CM | POA: Diagnosis not present

## 2020-10-20 DIAGNOSIS — Z992 Dependence on renal dialysis: Secondary | ICD-10-CM | POA: Diagnosis not present

## 2020-10-20 DIAGNOSIS — Z86718 Personal history of other venous thrombosis and embolism: Secondary | ICD-10-CM | POA: Diagnosis not present

## 2020-10-20 DIAGNOSIS — E1022 Type 1 diabetes mellitus with diabetic chronic kidney disease: Secondary | ICD-10-CM | POA: Insufficient documentation

## 2020-10-20 DIAGNOSIS — M869 Osteomyelitis, unspecified: Secondary | ICD-10-CM | POA: Insufficient documentation

## 2020-10-20 DIAGNOSIS — I120 Hypertensive chronic kidney disease with stage 5 chronic kidney disease or end stage renal disease: Secondary | ICD-10-CM | POA: Diagnosis not present

## 2020-10-20 DIAGNOSIS — G40909 Epilepsy, unspecified, not intractable, without status epilepticus: Secondary | ICD-10-CM | POA: Insufficient documentation

## 2020-10-20 NOTE — Progress Notes (Signed)
[name], [name] ([id number]) Visit Report for 10/20/2020 HPI Details Patient Name: Date of Service: GA [name] [name] 10/20/2020 12:30 PM Medical Record Number: [id number] Patient Account Number: [id number] Date of Birth/Sex: Treating RN: 12/07/1970 (50 y.o. [name] Primary Care Provider: [name], [name] Other Clinician: Referring Provider: Treating Provider/Extender: [name], [name] Weeks in Treatment: 111 History of Present Illness HPI Description: When50 year old diabetic who is known to have type 1 diabetes which is poorly controlled last hemoglobin A1c was 11%. She comes in with a ulcerated area on the left lateral foot which has been there for over 6 months. Was recently she has been treated by [name] of podiatry who saw her last on 05/28/2016. Review of his notes revealed that the patient had incision and drainage with placement of antibiotic beads to the left foot on 04/11/2016 for possible osteomyelitis of the cuboid bone. Over the last year she's had a history of amputation of the left fifth toe and a femoropopliteal popliteal bypass graft somewhere in April 2017. 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 His note [name] mentions that the patient has been referred to me for further wound care and possibly great candidate for hyperbaric oxygen therapy due to recurrent osteomyelitis. However we do not have any x-rays of biopsy reports confirming this. He has been on several antibiotics including Bactrim and most recently is on doxycycline for an MRSA. I understand, the patient was not a candidate for IV antibiotics as she has had previous PICC lines which resulted in blood clots in both arms. There was a x-ray report dated 04/04/2016 on [name] notes which showed evidence of fifth ray resection left foot with osteolytic changes noted to the fourth metatarsal and cuboid bone on the left. 06/13/2016 -- had a left foot x-ray  which showed no acute fracture or dislocation and no definite radiographic evidence of osteomyelitis. Advanced osteopenia was seen. 06/20/2016 -- she has noticed a new wound on the right plantar foot in the region where she had a callus before. 06/27/16- the patient did have her x-ray of the right foot which showed no findings to suggest osteomyelitis. She saw her endocrinologist, [name], yesterday. Her A1c in January was 11. He also indicates mismanagement and noncompliance regarding her diabetes. She is currently on Bactrim for a lip infection. She is complaining of nausea, vomiting and diarrhea. She is unable to articulate the exact orders or dosing of the Bactrim; it is unclear when she will complete this. 07/04/2016 -- results from [facility] of ABIs with ankle waveforms were noted from 02/14/2016. The examination done on 06/27/2015 showed noncompressible ABIs with the right being 1.45 and the left being 1.33. The present examination showed a right ABI of 1.19 on the left of 1.33. The conclusion was that right normal ABI in the lower extremity at rest however compared to previous study which was noncompressible ABI may be falsely elevated side suggesting medial calcification. The left ABI suggested medial calcification. 08/01/2016 -- the patient had more redness and pain on her right foot and did not get to come to see as noted she see her PCP or go to the ER and decided to take some leftover metronidazole which she had at home. As usual, the patient does report she feels and is rather noncompliant. 08/08/2016 -- -- x-ray of the right foot -- FINDINGS:Transmetatarsal amputation is noted. No bony destruction is noted to suggest osteomyelitis. IMPRESSION: No evidence of  osteomyelitis. Postsurgical changes are seen. MRI would be more sensitive for possible bony changes. Culture has grown Serratia Marcescens -- sensitive to Bactrim, ciprofloxacin, ceftazidime she was seen by [name] on  08/06/2016. He did not find any exposed bone, muscle, tendon, ligament or joint. There was no malodor and he did a excisional debridement in the office. ============ Old notes: 50 year old patient who is known to the wound clinic for a while had been away from the wound clinic since 09/01/2014. Over the last several months she has been admitted to various hospitals including [facility] [place]. She was treated for a right metatarsal osteomyelitis with a transmetatarsal amputation and this was done about 2 months ago. He has a small ulcerated area on the right heel and she continues to have an ulcerated area on the left plantar aspect of the foot. The patient was recently admitted to the [facility] between 7/12 and 10/18/2014. she was given 3 weeks of IV vancomycin and was to follow-up with her surgeons at [facility] and also took oral vancomycin for C. difficile colitis. 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 MRI of the left foot done on 10/14/2014 showed no abscess or osteomyelitis. 04/27/15; this is a patient we know from previous stays in the wound care center. She is a type I diabetic I am not sure of her control currently. Since the last time I saw her she is had a right transmetatarsal amputation and has no wounds on her right foot and has no open wounds. She is been followed at the wound care center at [facility] in [place]. She comes today with the desire to undergo hyperbaric treatment locally. Apparently one of her wound care providers in [place] has suggested hyperbarics. This is in response to an MRI from 04/18/15 that showed increased marrow signal and loss of the proximal  fifth metatarsal cortex evidence of osteomyelitis with likely early osteomyelitis in the cuboid bone as well. She has a large wound over the base of the fifth metatarsal. She also has a eschar over her the tips of her toes on 1,3 and 5. She does not have peripheral pulses and apparently is going for an angiogram tomorrow which seems reasonable. After this she is going to infectious disease at [facility]. They have been using Medihoney to the large wound on the lateral aspect of the left foot to. The patient has known Charcot deformity from diabetic neuropathy. She also has known diabetic PAD. Surprisingly I can't see that she has had any recent antibiotics, the patient states the last antibiotic she had was at the end of November for 10 days. I think this was in response to culture that showed group G strep although I'm not exactly sure where the culture was from. She is also had arterial studies on 03/29/15. This showed a right ABI of 1.4 that was noncompressible. Her left ABI was 0.73. 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 It was not felt that arterial inflow was adequate for healing of a foot ulcer. Her Doppler waveforms looked monophasic ===== READMISSION 02/28/17; this is in an now 50 year old woman we've had at several different occasions in this clinic. 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 She has  a remote ex-smoker. She was last seen in this clinic by Dr. Con Memos I think in May. More recently she is been followed by her podiatrist [name] an infectious disease Dr. Megan Salon. She has 2 open wounds the major one is over the right first metatarsal head she also has a wound on the left plantar foot. an MRI of the right foot on 01/01/17 showed a soft tissue ulcer along the plantar aspect of the first metatarsal base consistent with osteomyelitis of the first metatarsal stump. Dr. 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 According to the patient this is been open for slightly over a month. She has been on a combination of Cipro 500 twice a day, Zyvox 600 twice a day and Flagyl 500 3 times a day for over a month now as directed by Dr. Megan Salon. cultures of the right foot earlier this year showed MRSA in January and Serratia in May. January also had a few viridans strep. Recent x-rays of both feet were done and [name] office and I don't have these reports. The patient has known PAD and has a history of aleft femoropopliteal bypass in April 2017. 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 Dwyane Dee. In spite of this the last hemoglobin A1c I can see is 10.1 on 01/01/2017. She is being referred by 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 She is been using Medihoney to this area and also an area on the left midfoot. She is using healing sandals bilaterally. ABIs in this clinic at the left posterior tibial was 1.1 noncompressible on the right READMISSION Non invasive vascular NOVANT 5/18 Aftercare following surgery of the circulatory system Procedure Note - Interface, External Ris In - 08/13/2016 11:05 AM EDT Procedure: Examination consists of physiologic resting arterial pressures of the brachial and ankle arteries bilaterally with continuous wave Doppler waveform analysis. Previous: Previous exam performed on 02/14/16 demonstrated ABIs of Rt = 1.19 and Lt = 1.33. Right: ABI = non-compressible PT 1.47 DP. S/P transmet amputation. , Left: ABI = 1.52, 2nd digit pressure = 87 mmHg Conclusions: Right: ABI (>1.3) may be falsely elevated, suggesting medial calcification. Left: ABI (>1.3) may be falsely  elevated, suggesting medial calcification The patient is a now 50 year old type I diabetic is had multiple issues her graded to chronic diabetic foot ulcers. 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 We had her in the clinic lastin November. At that point she had wounds on her bilateral feet.she had wanted to try hyperbarics however the healogics review process denied her because she hadn't followed up with her vascular surgeon for her left femoropopliteal bypass. The bypass was done by Dr. Raul Del at [facility]. We made her a follow-up with Dr. Raul Del however she did not keep the appointment and therefore she was not approved The patient shows me a small wound on her left fourth metatarsal head on her phone. She developed rapid discoloration in the plantar aspect of the left foot and she was admitted to hospital from 2/2 through 05/10/17 with wet gangrene of the left foot osteomyelitis of the fourth metatarsal heads. She was admitted acutely ill with a temperature of 103.  She was started on broad-spectrum vancomycin and cefepime. On 05/06/17 she was taken to the OR by [name] her podiatric surgeon for an incision and drainage irrigation of the left foot wound. Cultures from this surgery revealed group be strep and anaerobes. she was seen by Dr.Xu of orthopedic surgery and scheduled for a below-knee amputation which she u refused. Ultimately she was discharged on Levaquin and Flagyl for one month. MRI 05/05/17 done while she was in the hospital showed abscess adjacent to the fourth metatarsal head and neck small abscess around the fourth flexor tendon. Inflammatory phlegmon and gas in the soft tissues along the lateral aspect of the fourth phalanx. Findings worrisome for osteomyelitis involving the fourth proximal and middle phalanx and also the third and fourth metatarsals. Finally the patient had actually shortly  before this followed up with Dr. Raul Del at no time on 04/29/17. He felt that her left femoropopliteal bypass was patent he felt that her left-sided toe pressures more than adequate for healing a wound on the left foot. This was before her acute presentation. 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 05/28/17; she is started hyperbarics. The patient tells me that for some reason she was not actually on Levaquin but I think on ciprofloxacin. She was on Flagyl. She only started her Levaquin yesterday due to some difficulty with the pharmacy and perhaps her sister picking it up. She has an appointment with [name] tomorrow and with infectious disease early next week. She has no new complaints 06/06/17; the patient continues in hyperbarics. She saw [name] on 05/29/17 who is her podiatric surgeon. He is elected for a transmetatarsal amputation on 06/27/17. I'm not sure at what level he plans to do this amputation. The patient is unaware She also saw Dr. Megan Salon of infectious disease who elected to continue her on current antibiotics I think this is ciprofloxacin and Flagyl. I'll need to clarify with her tomorrow if she actually has this. We're using silver alginate to the actual wound. Necrotic surface today with material under the flap of her foot. Original MRI showed abscesses as well as osteomyelitis of the proximal and middle fourth phalanx and the third and fourth metatarsal heads 06/11/17; patient continues in hyperbarics and continues on oral antibiotics. She is doing well. The wound looks better. The necrotic part of this under the flap in her superior foot also looks better. she is been to see [name]. I haven't had a chance to look at his note. Apparently he has put the transmetatarsal amputation on hold her request it is still planning to take her to the OR for debridement and product application ACEL. I'll see if I can find his note. I'll therefore leave product ordering/requests to [name]  for now. I was going to look at Dermagraft 06/18/17-she is here in follow-up evaluation for bilateral foot wounds. She continues with hyperbaric therapy. She states she has been applying manuka honey to the right plantar foot and alternate manuka honey and silver alginate to the left foot, despite our orders. We will continue with same treatment plan and she will follo up next week. 06/25/17; I have reviewed [name] last note from 3/11. She has operative debridement in 2 days' time. By review his note apparently they're going to place there is skin over the majority of this wound which is a good choice. She has a small satellite area at the most proximal part of this wound on the left plantar foot. The area on the right  plantar foot we've been using silver alginate and it is close to healing. 07/02/17; unfortunately the patient was not easily approved for [name] proposed surgery. I'm not completely certain what the issue is. She has been using silver alginate to the wound she has completed a first course of hyperbarics. She is still on Levaquin and Flagyl. I have really lost track of the time course here.I suspect she should have another week to 2 of antibiotics. I'll need to see if she is followed up with infectious disease Dr. Megan Salon 07/09/17; the patient is followed up with Dr. Megan Salon. 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 She continues on Levaquin and metronidazole continuing both of these for now I think she is been on fr about 6 weeks. She still has some drainage but no pain. No fever. Her had been plans for her to go to the OR for operative debridement with her podiatrist [name], I am not exactly sure where that is. I'll probably slip a note to [name] today. I note that she follows with Dr. Dwyane Dee of endocrinology. We have her recertified for hyperbaric oxygen. I have not heard about Dermagraft however I'll see if [name] is planning a  skin substitute as well 07/16/17; the patient tells me she is just about out of [place]. I'll need to check Dr. Hale Bogus last notes on this. She states she has plenty of Flagyl however. She comes in today complaining of pain in the right lateral foot which she said lasted for about a day. The wound on the right foot is actually much more medially. She also tells me that the [facility] cost a lot of pain in the left foot wound and she turned back to silver alginate. Finally Dermagraft has a $481 per application co-pay. She cannot afford this 07/23/17; patient arrives today with the wound not much smaller. There is not much new to add. She has not heard from [name] all try to put in a call to them today. She was asking about Dermagraft again and she has an over $856 per application co-pay she states that she would be willing to try to do a payment plan. I been tried to avoid this. We've been using silver alginate, I'll change to [facility] 07/30/17-She is here in follow-up evaluation for left foot ulcer. She continues hyperbaric medicine. The left foot ulcer is stable we will continue with same treatment plan 08/06/17; she is here for evaluation of her left foot ulcer. Currently being treated for hyperbarics or underlying osteomyelitis. She is completed antibiotics. The left foot ulcer is better smaller with healthier looking granulation. For various reasons I am not really clear on we never got her back to the OR with [name]. He did not respond to my secure text message. Nevertheless I think that surgery on this point is not necessary nor am I completely clear that a skin substitute is necessary The patient is complaining about pain on the outside of her right foot. 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 There is no erythema. She also states the foot is warm versus her other part of her upper leg and this is largely true. It is not totally clear to me what's causing this. She  thinks it's different from her usual neuropathy pain 08/13/17; she arrives in clinic today with a small wound which is superficial on her right first metatarsal head. 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 She tells [place] she was up on her feet over the Mother's Day celebration. The large wound is on the left foot. Continues with hyperbarics for underlying osteomyelitis. We're using Hydrofera Blue. She asked me today about where we were with Dermagraft. I had actually excluded this because of the co-pay however she wants to assume this therefore I'll recheck the co-pay an order for next week. 08/20/17; the patient agreed to accept the co-pay of the first Apligraf which we applied today. She is disappointed she is finishing hyperbarics will run this through the insurance on the extent of the foot infection and the extent of the wound that she had however she is already had 60 dive's. Dermagraft No. 1 08/27/17; Dermagraft No. 2. She is not eligible for any more hyperbaric treatments this month. She reports a fair amount of drainage and she actually changed to the external dressings without disturbing the direct contact layer 09/03/17; the patient arrived in clinic today with the wound superficially looking quite healthy. Nice vibrant red tissue with some advancing epithelialization although not as much adherence of the flap as I might like. However she noted on her own fourth toe some bogginess and she brought that to our attention. Indeed this was boggy feeling like a possibility of subcutaneous fluid. 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 She is not been unwell. We've been using Dermagraft 09/10/17; the culture that I did not last week was MRSA. She saw Dr. Megan Salon this morning who is going to start her on vancomycin. I had sent him a secure a text message yesterday. I also spoke with her podiatric surgeon  [name] about surgery on this foot the options for conserving a functional foot etc. Promised me he would see her and will make back consultation today. Paradoxically her actual wound on the plantar aspect of her left foot looks really quite good. I had given her 5 days worth of Baxdella to cover her for MRSA. Her MRI came back showing osteomyelitis within the third metatarsal shaft and head and base of the third and fourth proximal phalanx. She had extensive inflammatory changes throughout the soft tissue of the lateral forefoot. With an ill-defined fluid around the fourth metatarsal extending into the plantar and dorsal soft tissues 09/19/17; the patient is actually on oral Septra and Flagyl. She apparently refused IV vancomycin. She also saw [name] at my request who is planning her for a left BKA sometime in mid July. MRI showed osteomyelitis within the third metatarsal shaft and head and the basis of the third and fourth proximal phalanx. I believe there was felt to be possible septic arthritis involving the third MTP. 09/26/17; the patient went back to Dr. Megan Salon at my suggestion and is now receiving IV daptomycin. Her wound continues to look quite good making the decision to proceed with a transmetatarsal amputation although more difficult for the patient. I believe in my extensive discussions with her she has a good sense of the pros and cons of this. I don't NV the tuft decision she has to make. She has an appointment with [name] I believe in mid July and I previously spoken to him about this issue Has we had used 3 previous Dermagraft. Given the condition of the wound surface I went ahead and added the fourth one today area and I did this not fully realizing that she'll be traveling  to [place] next week. I'm hopeful she can come back in 2 weeks 10/21/17; Her same Dermagraft on for about 3-1/2 weeks. In spite of this the wound arrives looking quite healthy. There is been a lot of  healing dimensions are smaller. Looking at the square shaped wound she has now there is some undermining and some depth medially under the undermining although I cannot palpate any bone. No surrounding infection is obvious. She has difficult questions about how to look at this going forward vis--vis amputations versus continued medical therapy. T be truthful the wound is looks o so healthy and it is continued to contract. Hard to justify foot surgery at this point although I still told her that I think it might come to that if we are not able to eradicate the underlying MRSA. She is still highly at risk and she understands this 11/06/17 on evaluation today patient appears to be doing better in regard to her foot ulcer. She's been tolerating the dressing changes without complication. Currently she is here for her Dermagraft #6. Her wound continues to make excellent progress at this point. She does not appear to have any evidence of infection which is good news. 11/13/17 on evaluation today patient appears to be doing excellent at this time. She is here for repeat Dermagraft application. This is #7. Overall her wound seems to be making great progress. 12/05/17; the patient arrives with the wound in much better condition than when I last saw this almost 6 weeks ago. She still has a small probing area in the left metatarsal head region on the lateral aspect of her foot. We applied her last Dermagraft today. Since the last time she is here she has what appears to a been a blood blister on the plantar aspect of left foot although I don't see this is threatening. There is also a thick raised tissue on the right mid metatarsal head region. This was not there I don't think the last time she was here 3 weeks ago. 12/12/17; the patient continues to have a small programming area in the left metatarsal head region on the lateral aspect of her foot which was the initial large surgical wound. I applied her last Apligraf  last week. I'm going to use Endoform starting today Unfortunately she has an excoriated area in the left mid foot and the right mid foot. The left midfoot looks like a blistered area this was not opened last week it certainly is open today. Using silver alginate on these areas. She promises me she is offloading this. 12/19/17; the small probing area in the left metatarsal head eyes think is shallower. In general her original wound looks better. We've been using Endoform. The area inferiorly that I think was trauma last week still requires debridement a lot of nonviable surface which I removed. She still has an open open area distally in her foot Similarly on the right foot there is tightly adherent surface debris which I removed. Still areas that don't look completely epithelialized. This is a small open area. We used silver alginate on these areas 12/26/2017; the patient did not have the supplies we ordered from last week including the Endoform. The original large wound on the left lateral foot looks healthy. She still has the undermining area that is largely unchanged from last week. She has the same heavily callused raised edged wounds on the right mid and left midfoot. Both of these requiring debridement. We have been using silver alginate on these areas 01/02/2018;  there is still supply issues. We are going to try to use Prisma but I am not sure she actually got it from what she is saying. She has a new open area on the lateral aspect of the left fourth toe [previous fifth ray amputation]. Still the one tunneling area over the fourth metatarsal head. The area is in the midfoot bilaterally still have thick callus around them. She is concerned about a raised swelling on the lateral aspect of the foot. However she is completely insensate 01/10/2018; we are using Prisma to the wounds on her bilateral feet. Surprisingly the tunneling area over the left fourth metatarsal head that was part of  her original surgery has closed down. She has a small open area remaining on the incision line. 2 open areas in the midfoot. 02/10/2018; the patient arrives back in clinic after a month hiatus. She was traveling to visit family in [place]. Is fairly clear she was not offloading the areas on her feet. The original wound over the left lateral foot at the level of metatarsal heads is reopened and probes medially by about a centimeter or 2. She notes that a week ago she had purulent drainage come out of an area on the left midfoot. Paradoxically the worst area is actually on the right foot is extensive with purulent drainage. We will use silver alginate today 02/17/2018; the patient has 3 wounds one over the left lateral foot. She still has a small area over the metatarsal heads which is the remnant of her original surgical wound. This has medial probing depth of roughly 1.4 cm somewhat better than last week. The area on the right foot is larger. We have been using silver alginate to all areas. The area on the right foot and left foot that we cultured last week showed both Klebsiella and Proteus. Both of these are quinolone sensitive. The patient put her's self on Bactrim and Flagyl that she had left hanging around from prior antibiotic usages. She was apparently on this last week when she arrived. I did not realize this. Unfortunately the Bactrim will not cover either 1 of these organisms. We will send in Cipro 500 twice daily for a week 03/04/2018; the patient has 2 wounds on the left foot one is the original wound which was a surgical wound for a deep DFU. At one point this had exposed bone. She still has an area over the fourth metatarsal head that probes about 1.4 cm although I think this is better than last week. I been using silver nitrate to try and promote tissue adherence and been using silver alginate here. She also has an area in the left midfoot. This has some depth but a small linear wound.  Still requiring debridement. On the right midfoot is a circular wound. A lot of thick callus around this area. We have been using silver alginate to all wound areas She is completed the ciprofloxacin I gave her 2 weeks ago. 03/11/2018; the patient continues to have 2 open areas on the left foot 1 of which was the original surgical wound for a deep DFU. Only a small probing area remains although this is not much different from last week we have been using silver alginate. The other area is on the midfoot this is smaller linear but still with some depth. We have been using silver alginate here as well On the right foot she has a small circular wound in the mid aspect. This is not much smaller than last  time. We have been using silver alginate here as well 03/18/2018; she has 3 wounds on the left foot the original surgical wound, a very superficial wound in the mid aspect and then finally the area in the mid plantar foot. She arrives in today with a very concerning area in the wound in the mid plantar foot which is her most proximal wound. There is undermining here of roughly 1-1/2 cm superiorly. Serosanguineous drainage. She tells me she had some pain on for over the weekend that shot up her foot into her thigh and she tells me that she had a nodule in the groin area. She has the single wound in the right foot. We are using endoform to both wound areas 03/24/2018; the patient arrives with the original surgical wound in the area on the left midfoot about the same as last week. There is a collection of fluid under the surface of the skin extending from the surgical wound towards the midfoot although it does not reach the midfoot wound. The area on the right foot is about the same. Cultures from last week of the left midfoot wound showed abundant Klebsiella abundant Enterococcus faecalis and moderate methicillin resistant staph I gave her Levaquin but this would have only covered the Klebsiella. She  will need linezolid 04/01/2018; she is taking linezolid but for the first few days only took 1 a day. I have advised her to finish this at twice daily dosing. In any case all of her wounds are a lot better especially on the left foot. The original surgical wound is closed. The area on the left midfoot considerably smaller. The area on the right foot also smaller. 04/08/2018; her original surgical wound/osteomyelitis on the left foot remains closed. She has area on the left foot that is in the midfoot area but she had some streaking towards this. This is not connected with her original wound at least not visually. Small wound on the right midfoot appears somewhat smaller. 04/15/18; both wounds looks better. Original wound is better left midfoot. Using silver alginate 1/21; patient states she uses saltwater soak in, stones or remove callus from around her wounds. She is also concerned about a blood blister she had on the left foot but it simply resolved on its own. We've been using silver alginate 1/28; the patient arrives today with the same streaking area from her metatarsals laterally [the site of her original surgical wound] down to the middle of her foot. There is some drainage in the subcutaneous area here. This concerns me that there is actually continued ongoing infection in the metatarsals probably the fourth and third. This fixates an MRI of the foot without contrast [chronic renal failure] The wound in the mid part of the foot is small but I wonder whether this area actually connects with the more distal foot. The area on the right midfoot is probably about the same. Callus thick skin around the small wound which I removed with a curette we have been using silver alginate on both wound areas 2/4; culture I did of the draining site on the left foot last time grew methicillin sensitive staph aureus. MRI of the left foot showed interval resolution of the findings surrounding the third metatarsal  joint on the prior study consistent with treated osteomyelitis. Chronic soft tissue ulceration in the plantar and lateral aspect of the forefoot without residual focal fluid collection. No evidence of recurrent osteomyelitis. Noted to have the previous amputation of the distal first phalanx and fifth  ray MRI of the right foot showed no evidence of osteomyelitis I am going to treat the patient with a prolonged course of antibiotics directed against MSSA in the left foot 2/11; patient continues on cephalexin. She tells me she had nausea and vomiting over the weekend and missed 2 days. In general her foot looks much the same. She has a small open area just below the left fourth metatarsal head. A linear area in the left midfoot. Some discoloration extending from the inferior part of this into the left lateral foot although this appears to be superficial. She has a small area on the right midfoot which generally looks smaller after debridement 2/18; the patient is completing his cephalexin and has another 2 days. She continues to have open areas on the left and right foot. 2/25; she is now off antibiotics. The area on the left foot at the site of her original surgical wound has closed yet again. She still has open areas in the mid part of her foot however these appear smaller. The area on the right mid foot looks about the same. We have been using silver alginate She tells me she had a serious hypoglycemic spell at home. She had to have EMS called and get IV dextrose 3/3; disappointing on the left lateral foot large area of necrotic tissue surrounding the linear area. This appears to track up towards the same original surgical wound. Required extensive debridement. The area on the right plantar foot is not a lot better also using silver 3/12; the culture I did last time showed abundant enterococcus. I have prescribed Augmentin, should cover any unrecognized anaerobes as well. In addition there were a few  MRSA and Serratia that would not be well covered although I did not want to give her multiple antibiotics. She comes in today with a new wound in the right midfoot this is not connected with the original wound over her MTP a lot of thick callus tissue around both wounds but once again she said she is not walking on these areas 3/17-Patient comes in for follow-up on the bilateral plantar wounds, the right midfoot and the left plantar wound. Both these are heavily callused surrounding the wounds. We are continuing to use silver alginate, she is compliant with offloading and states she uses a wheelchair fairly often at home 3/24; both wound areas have thick callus. However things actually look quite a bit better here for the majority of her left foot and the right foot. 3/31; patient continues to have thick callused somewhat irritated looking tissue around the wounds which individually are fairly superficial. There is no evidence of surrounding infection. We have been using silver alginate however I change that to [facility] today 4/17; patient returns to clinic after having a scare with Covid she tested negative in her primary doctor's office. She has been using Hydrofera Blue. She does not have an open area on the right foot. On the left foot she has a small open area with the mid area not completely viable. She showed me pictures of what looks like a hemorrhagic blister from several days ago but that seems to have healed over this was on the lateral left foot 4/21; patient comes in to clinic with both her wounds on her feet closed. However over the weekend she started having pain in her right foot and leg up into the thigh. She felt as though she was running a low-grade fever but did not take her temperature. She took a  doxycycline that she had leftover and yesterday a single Septra and metronidazole. She thinks things feel somewhat better. 4/28; duplex ultrasound I ordered last week was negative  for DVT or superficial thrombophlebitis. She is completed the doxycycline I gave her. States she is still having a lot of pain in the right calf and right ankle which is no better than last week. She cannot sleep. She also states she has a temperature of up to 101, coughing and complaining of visual loss in her bilateral eyes. Apparently she was tested for Covid 2 weeks ago at [facility] and that was negative. Readmission: 09/03/18 patient presents back for reevaluation after having been evaluated at the end of April regarding erythema and swelling of her right lower extremity. Subsequently she ended up going to the hospital on 07/29/18 and was admitted not to be discharged until 08/08/18. Unfortunately it was noted during the time that she was in the hospital that she did have methicillin-resistant Staphylococcus aureus as the infection noted at the site. It was also determined that she did have osteomyelitis which appears to be fairly significant. She was treated with vancomycin and in fact is still on IV vancomycin at dialysis currently. This is actually slated to continue until 09/12/18 at least which will be the completion of the six weeks of therapy. Nonetheless based on what I'm seeing at this point I'm not sure she will be anywhere near ready to discontinue antibiotics at that time. Since she was released from the hospital she was seen by [name] who is her podiatrist on 08/27/18. His note specifically states that he is recommended that the patient needs of one knee amputation on the right as she has a life- threatening situation that can lead quickly to sepsis. The patient advised she would like to try to save her leg to which [name] apparently told her that this was against all medical advice. She also want to discontinue the Wound VAC which had been initiated due to the fact that she wasn't pleased with how the wound was looking and subsequently she wanted to pursue applying Medihoney at that time.  He stated that he did not believe that the right lower extremity was salvageable and that the patient understood but would still like to attempt hyperbaric option therapy if it could be of any benefit. She was therefore referred back to [place] for further evaluation. He plans to see her back next week. Upon inspection today patient has a significant amount purulent drainage noted from the wound at this point. The bone in the distal portion of her foot also appears to be extremely necrotic and spongy. When I push down on the bone it bubbles and seeps purulent drainage from deeper in the end of the foot. I do not think that this is likely going to heal very well at all and less aggressive surgical debridement were undertaken more than what I believe we can likely do here in our office. 09/12/2018; I have not seen this patient since the most recent hospitalization although she was in our clinic last week. I have reviewed some of her records from a complex hospitalization. 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 There is situation was complicated by MRSA bacteremia and acute on chronic renal failure now on dialysis. She is receiving vancomycin at dialysis. We started her on Dakin's wet-to-dry last week she is changing this daily. There is still purulent drainage coming out of her foot.  Although she is apparently "agreeable" to a below-knee amputation which is been suggested by multiple clinicians she wants this to be done in [place]. She apparently has a telehealth visit with that provider sometime in late Tye 6/24. I have told her I think this is probably too long. Nevertheless I could not convince her to allow a local doctor to perform BKA. 09/19/2018; the patient has a large necrotic area on the right anterior foot. She has had previous transmetatarsal amputations. Culture I did last week showed MRSA nothing else she is on vancomycin at dialysis. She  has continued leaking purulent drainage out of the distal part of the large circular wound on the right anterior foot. She apparently went to see Dr. 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 Somehow that translated into her being referred to plastic surgery for debridement of the area. I gather she basically refused amputation although I do not have a copy of Dr. Berenice Primas notes. The patient really wants to have a trial of hyperbaric oxygen. I agreed with initial assessment in this clinic that this was probably too far along to benefit however if she is going to have plastic surgery I think she would benefit from ancillary hyperbaric oxygen. The issue here is that the patient has benefited as maximally as any patient I have ever seen from hyperbaric oxygen therapy. Most recently she had exposed bone on the lateral part of her left foot after a surgical procedure and that actually has closed. She has eschared areas in both heels but no open area. She is remained systemically well. I am not optimistic that anything can be done about this but the patient is very clear that she wants an attempt. The attempt would include a wound VAC further debridements and hyperbaric oxygen along with IV antibiotics. 6/26; I put her in for a trial of hyperbaric oxygen only because of the dramatic response she has had with wounds on her left midfoot earlier this year which was a surgical wound that went straight to her bone over the metatarsal heads and also remotely the left third toe. We will see if we can get this through our review process and insurance. She arrives in clinic with again purulent material pouring out of necrotic bone on the top of the foot distally. There is also some concerning erythema on the front of the leg that we marked. It is bit difficult to tell how tender this is because of neuropathy. I note from infectious disease that she had her vancomycin extended. All the  cultures of these areas have shown MRSA sensitive to vancomycin. She had the wound VAC on for part of the week. The rest of the time she is putting various things on this including Medihoney, "ionized water" silver sorb gel etc. 7/7; follow-up along with HBO. She is still on vancomycin at dialysis. She has a large open area on the dorsal right foot and a small dark eschar area on her heel. There is a lot less erythema in the area and a lot less tenderness. From an infection point of view I think this is better. She still has a lot of necrosis in the remaining right forefoot [previous TMA] we are still using the wound VAC in this area 7/16; follow-up along with HBO. I put her on linezolid after she finished her vancomycin. We started this last Friday I gave her 2 weeks worth. I had the expectation that she would be operatively debrided by Dr. Marla Roe  but that still has not happened yet. Patient phoned the office this week. She arrives for review today after HBO. The distal part of this wound is completely necrotic. Nonviable pieces of tendon bone was still purulent drainage. Also concerning that she has black eschar over the heel that is expanding. I think this may be indicative of infection in this area as well. She has less erythema and warmth in the ankle and calf but still an abnormal exam 7/21 follow-up along with HBO. I will renew her linezolid after checking a CBC with differential monitoring her blood counts especially her platelets. She was supposed to have surgery yesterday but if I am reading things correctly this was canceled after her blood sugar was found to be over 500. I thought Dr. Marla Roe who called me said that they were sending her to the ER but the patient states that was not the case. 7/28. Follow-up along with HBO. She is on linezolid I still do not have any lab work from dialysis even though I called last week. The patient is concerned about an area on her left lateral  foot about the level of the base of her fifth metatarsal. I did not really see anything that ominous here however this patient is in [place] ability to point out problems that she is sensing and she has been accurate in the past Finally she received a call from Dr. Marla Roe who is referring her to another orthopedic surgeon stating that she is too booked up to take her to the operating room now. Was still using a wound VAC on the foot 8/3 -Follow-up after HBO, she is got another week of linezolid, she is to call ID for an appointment, x-rays of both feet were reviewed, the left foot x-ray with third MTP joint osteo- Right foot x-ray widespread osteo-in the right midfoot Right ankle x-ray does not show any active evidence of infection 8/11-Patient is seen after HBO, the wounds on the right foot appear to be about the same, the heel wound had some necrotic base over tendon that was debrided with a curette 8/21; patient is seen after HBO. The patient's wound on her dorsal foot actually looks reasonably good and there is substantial amount of epithelialization however the open area distally still has a lot of necrotic debris partially bone. I cannot really get a good sense of just how deep this probes under the foot. She has been pressuring me this week to order medical maggots through a company in [place] for her. The problem I have is there is not a defined wound area here. On the positive side there is no purulence. She has been to see infectious disease she is still on Septra DS although I have not had a chance to review their notes 8/28; patient is seen in conjunction with HBO. The wounds on her foot continued to improve including the right dorsal foot substantially the, the distal part of this wound and the area on the right heel. We have been using a wound VAC over this chronically. She is still on trimethoprim as directed by infectious disease 9/4; patient is seen in conjunction with HBO.  Right dorsal foot wound substantially anteriorly is better however she continues to have a deep wound in the distal part of this that is not responding. We have been using silver collagen under border foam Area on the right plantar medial heel seems better. We have been using Hydrofera Blue 12/12/18 on evaluation today patient appears to be  doing about the same with regard to her wound based on prior measurements. She does have some necrotic tissue noted on the lateral aspect of the wound that is going require a little bit of sharp debridement today. This includes what appears to be potentially either severely necrotic bone or tendon. Nonetheless other than that she does not appear to have any severe infection which is good news 9/18; it is been 2 weeks since I saw this wound. She is tolerating HBO well. Continued dramatic improvement in the area on the right dorsal foot. She still has a small wound on the heel that we have been using Hydrofera Blue. She continues with a wound VAC 9/24; patient has to be seen emergently today with a swelling on her right lateral lower leg. She says that she told Dr. Evette Doffing about this and also myself on a couple of occasions but I really have no recollection of this. She is not systemically unwell and her wound really looked good the last time I saw this. She showed this to providers at dialysis and she was able to verify that she was started on cephalexin today for 5 doses at dialysis. She dialyzes on Tuesday Thursday and Saturday. 10/2; patient is seen in conjunction with HBO. The area that is draining on the right anterior medial tibia is more extensive. Copious amounts of serosanguineous drainage with some purulence. We are still using the wound VAC on the original wound then it is stable. Culture I did of the original IandD showed MRSA I contacted dialysis she is now on vancomycin with dialysis treatments. I asked them to run a month 10/9; patient seen in  conjunction with HBO. She had a new spontaneous open area just above the wound on the right medial tibia ankle. More swelling on the right medial tibia. Her wound on the foot looks about the same perhaps slightly better. There is no warmth spreading up her leg but no obvious erythema. her MRI of the foot and ankle and distal tib-fib is not booked for next Friday I discussed this with her in great detail over multiple days. it is likely she has spreading infection upper leg at least involving the distal 25% above the ankle. She knows that if I refer her to orthopedics for infectious disease they are going to recommend amputation and indeed I am not against this myself. We had a good trial at trying to heal the foot which is what she wanted along with antibiotics debridement and HBO however she clearly has spreading infection [probably staph aureus/MRSA]. Nevertheless she once again tells me she wants to wait the left of the MRI. She still makes comments about having her amputation done in [place]. 10/19; arrives today with significant swelling on the lateral right leg. Last culture I did showed Klebsiella. Multidrug-resistant. Cipro was intermediate sensitivity and that is what I have her on pending her MRI which apparently is going to be done on Thursday this week although this seems to be moving back and forth. She is not systemically unwell. We are using silver alginate on her major wound area on the right medial foot and the draining areas on the right lateral lower leg 10/26; MRI showed extensive abscess in the anterior compartment of the right leg also widespread osteomyelitis involving osseous structures of the midfoot and portions of the hindfoot. Also suspicion for osteomyelitis anterior aspect of the distal medial malleolus. Culture I did of the purulence once again showed a multidrug-resistant Klebsiella. I have been  in contact with nephrology late last week and she has been started on  cefepime at dialysis to replace the vancomycin We sent a copy of her MRI report to Dr. Geroge Baseman in [place] who is an orthopedic surgeon. The patient takes great stock in his opinion on this. She says she will go to [place] to have her leg amputated if Dr. Geroge Baseman does not feel there is any salvage options. 11/2; she still is not talk to her orthopedic surgeon in [place]. Apparently he will call her at [phone] this afternoon. The quality of this is she has not allowed me to refer her anywhere. She has been told over and over that she needs this amputated but has not agreed to be referred. She tells me her blood sugar was 600 last night but she has not been febrile. 11/9; she never did got a call from the orthopedic surgeon in [place] therefore that is off the radar. We have arranged to get her see orthopedic surgery at [facility]. She still has a lot of draining purulence coming out of the new abscess in her right leg although that probably came from the osteomyelitis in her right foot and heel. Meanwhile the original wound on the right foot looks very healthy. Continued improvement. The issue is that the last MRI showed osteomyelitis in her right foot extensively she now has an abscess in the right anterior lower leg. There is nobody in [place] who will offer this woman anything but an amputation and to be honest that is probably what she needs. I think she still wants to talk about limb salvage although at this point I just do not see that. She has completed her vancomycin at dialysis which was for the original staph aureus she is still on cefepime for the more recent Klebsiella. She has had a long course of both of these antibiotics which should have benefited the osteomyelitis on the right foot as well as the abscess. 11/16; apparently [place] elective surgery is shut down because of COVID-19 pandemic. I have reached out to some contacts at [facility] to see if we can get her an  orthopedic appointment there. I am concerned about continually leaving this but for the moment everything is static. In fact her original large wound on this foot is closing down. It is the abscess on the right anterior leg that continues to drain purulent serosanguineous material. She is not currently on any antibiotics however she had a prolonged course of vancomycin [1 month] as well as cefepime for a month 02/24/2019 on evaluation today patient appears to be doing better than the last time I saw her. This is not a patient that I typically see. With that being said I am covering for Dr. Dellia Nims this week and again compared to when I last saw her overall the wounds in particular seem to be doing significantly better which is good news. With that being said the patient tells me several disconcerting things. She has not been able to get in to see anyone for potential debridement in regard to her leg wounds although she tells me that she does not think it is necessary any longer because she is taking care of that herself. She noticed a string coming out of the lower wound on her leg over the last week. The patient states that she subsequently decided that we must of pack something in there and started pulling the string out and as it kept coming and coming she realized this was likely her  tendon. With that being said she continued to remove as much of this as she could. She then I subsequently proceeded to using tubes of antibiotic ointment which she will stick down into the wound and then scored as much as she can until she sees it coming out of the other wound opening. She states that in doing this she is actually made things better and there is less redness and irritation. With regard to her foot wound she does have some necrotic tendon and tissue noted in one small corner but again the actual wound itself seems to be doing better with good granulation in general compared to my last evaluation. 12/7;  continued improvement in the wound on the substantial part of the right medial foot. Still a necrotic area inferiorly that required debridement but the rest of this looks very healthy and is contracting. She has 2 wounds on the right lateral leg which were her original drainage sites from her abscess but all of this looks a lot better as well. She has been using silver alginate after putting antibiotic biotic ointment in one wound and watching it come out the other. I have talked to her in some detail today. I had given her names of orthopedic surgeons at [facility] for second opinion on what to do about the right leg. I do not think the patient never called them. She has not been able to get a hold of the orthopedic surgeon in [place] that she had put a lot of faith in as being somebody would give her an opinion that she would trust. I talked to her today and said even if I could get her in to another orthopedic surgeon about the leg which she accept an amputation and she said she would not therefore I am not going to press this issue for the moment 12/14; continued improvement in his substantial wound on the right medial foot. There is still a necrotic area inferiorly with tightly adherent necrotic debris which I have been working on debriding each time she is here. She does not have an orthopedic appointment. Since last time she was here I looked over her cultures which were essentially MRSA on the foot wound and gram-negative rods in the abscess on the anterior leg. 12/21; continued improvement in the area on the right medial foot. She is not up on this much and that is probably a good thing since I do not know it could support continuous ambulation. She has a small area on the right lateral leg which were remanence of the IandD's I did because of the abscess. I think she should probably have prophylactic antibiotics I am going to have to look this over to see if we can make an intelligent  decision here. In the meantime her major wound is come down nicely. Necrotic area inferiorly is still there but looks a lot better 04/06/2019; she has had some improvement in the overall surface area on the right medial foot somewhat narrowedr both but somewhat longer. The areas on the right lateral leg which were initial IandD sites are superficial. Nothing is present on the right heel. We are using silver alginate to the wound areas 1/18; right medial foot somewhat smaller. Still a deep probing area in the most distal recess of the wound. She has nothing open on the right leg. She has a new wound on the plantar aspect of her left fourth toe which may have come from just pulling skin. The patient using Medihoney on  the wound on her foot under silver alginate. I cannot discourage her from this 2/1; 2-week follow-up using silver alginate on the right foot and her left fourth toe. The area on the right dorsal foot is contracted although there is still the deep area in the most distal part of the wound but still has some probing depth. No overt infection 2/15; 2-week follow-up. She continues to have improvement in the surface area on the dorsal right foot. Even the tunneling area from last time is almost closed. The area that was on the plantar part of her left fourth toe over the PIP is indeed closed 3/1; 2-week follow-up. Continued improvement in surface area. The original divot that we have been debriding inferiorly I think has full epithelialization although the epithelialization is gone down into the wound with probably 4 mm of depth. Even under intense illumination I am unable to see anything open here. The remanence of the wound in this area actually look quite healthy. We have been using silver alginate 3/15; 2-week follow-up. Unfortunately not as good today. She has a comma shaped wound on the dorsal foot however the upper part of this is larger. Under illumination debris on the surface She also  tells [place] that she was on her right leg 2 times in the last couple of weeks mostly to reach up for things above her head etc. She felt a sharp pain in the right leg which she thinks is somewhere from the ankle to the knee. The patient has neuropathy and is really uncertain. She cannot feel her foot so she does not think it was coming from there 3/29; 2-week follow-up. Her wound measures smaller. Surface of the wound appears reasonable. She is using silver alginate with underlying Medihoney. She has home health. X-rays I did of her tib-fib last time were negative although it did show arterial calcification 4/12; 2-week follow-up. Her wound measures smaller in length. Using manuka honey with silver alginate on top. She has home health. 4/26; 2-week follow-up. Her wound is smaller but still very adherent debris under illumination requiring debridement she has been using manuka honey with silver alginate. 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 Been using Hydrofera Blue. She is on suppressive Bactrim but apparently she has been taking it twice daily 6/7; I have not seen this wound and about 6 weeks. Since then she was up in [place]. By her own admission she was walking on the foot because she did not have a wheelchair. The wound is not nearly as healthy looking as it was the last time I saw this. We ordered different things for her but she only uses Medihoney and silver alginate. As far as I know she is on suppressive trimethoprim sulfamethoxazole. She does not admit to any fever or chills. Her CBGs apparently are at baseline however she is saying that she feels some discomfort on the lateral part of her ankle I looked over her last inflammatory markers from the summer 2020 at which time she had a deeply necrotic infected wound in this area. On 11/10/2018 her sedimentation rate was 56 and C-reactive  protein 9.9. This was 107 and 29 on 07/29/2018. 6/17; the patient had a necrotic wound the last time she was here on the right dorsal foot. After debridement I did a culture. This showed a very resistant ESBL Klebsiella as well as Enterococcus. Her  x-ray of the foot which was done because of warmth and some discomfort showed bone destruction within the carpal bones involving the navicular acute cuboid lateral middle cuneiforms but essentially unchanged from her prior study which was done on 10/29/2018. The findings were felt to represent chronic osteomyelitis. We did inflammatory markers on her. Her white count was 5.25 sedimentation rate 16 and C-reactive protein at 11.1. Notable for the fact that in August 2020 her CRP was 9.9 and sedimentation rate 56. I have looked at her x-rays. It is true that the bone destruction is very impressive however the patient came into this clinic for the wound on her right foot with pieces of bone literally falling out anteriorly with purulent material. I am not exactly sure I could have expected anything different. She has not been systemically unwell no fever chills or blood sugars have been reasonable. 6/28; she arrives with a right heel closed. The substantial area on the right anterior foot looks healthy. Much better looking surface. I think we can change to [facility] seems to help this previously. She is getting her antibiotics at dialysis she should be just about finished 7/9; changed to [facility] last week. Surface wound looks satisfactory not much change in surface area however. She is going to [place] state next week this is usually a difficult thing for this patient follow-up will be for 2 weeks. 7/23; using Hydrofera Blue. She returns from her trip and the wound looks surprisingly good. Usually when this patient goes on trips she comes back with a lot of problems with the wound. She is saying that she sometimes feels an episodic "crunching"  feeling on the lateral part of the foot. She is neuropathic and not feeling pain but wonders whether this could be a neuropathic dysesthesia. 11/13/19-Patient returns after 3 weeks, the wound itself is stable and patient states that there is nothing new going on she is on some extra anxiety medications and is resisting the temptation to pick at the dry skin around the wound. 9/20; patient has not been here in over a month and I have not seen her in 2 months. The wound in terms of size I think is about the same. There is no exposed bone. She has a nonviable surface on this. She is supposed to be using [facility] however she is also been using some form of honey preparation as well as a silver-based dressings. I do not think she has any pattern to this. 10/4; 2-week follow-up. Patient has been using some form of spray which she says has honey and silver to purchase this online she has been covering it with gauze. In spite of this the wound actually looks quite good. The deeper divot distally appears to be close down. There is a rim of epithelialization. 10/18; 2-week follow-up. Patient has been using her Hydrofera Blue covered with her silver honey spray that she got online. 11/1; 2-week follow-up. She is using Hydrofera Blue with a silver honey spray. Wound bed is measuring smaller. She has noticed that her foot is warmer on the right. She is concerned about infection. For a long period of time I had her on prophylactic trimethoprim sulfamethoxazole DS 1 tablet daily. She is asking for this to be restarted. The patient is walking on this foot because of repairs that are being done in a home her but her room is on the second floor she has to go up and down stairs. I have cautioned against this however as  usual she will do exactly what she wants to do 11/15; 2-week follow-up. She uses Hydrofera Blue with a silver/honey spray which I have never heard of. I think her wound looks about the same.  Some epithelialization. No evidence that this is infected. I think she is walking on this more than we agreed on. She is going on extensive vacation over Thanksgiving 12/3; 2-week follow-up. She is using Hydrofera Blue however over Thanksgiving she ran out of this and she is simply been using Medihoney. In spite of this her wound is smaller almost divided into 2 now. She traveled extensively over Thanksgiving and actually looks quite good in spite of this. Usually this is been a marker of problems for her 12/17; 2-week follow-up. She is using Hydrofera Blue. The wound is smaller. Debris on the surface of this is fibrinous. She is traveling to [place] over the holidays which never bodes well for her wounds. I think she is walking more on her feet then she is even willing to admit and she tells me she does walk 2/11; using Hydrofera Blue. Her wounds are contracting however she walks in the clinic with a history that she has not been able to eat she has had vomiting. She also has right eye problems. Her blood sugar was 567. She had blood work done at dialysis and we called there to get her blood work although it still had not returned although they should have this by the end of the day we were told. She also stated that she was not sure what her blood sugar was as her glucose monitor was not working and not coming to tomorrow. She also for some reason does not think her insulin pump is working well. Her endocrinologist is Dr. Elayne Snare 06/03/2020 upon evaluation today patient actually appears to be doing excellent in regard to her wound. In fact she tells me it was not even bleeding until she picked a dry piece of skin off while we were getting ready to come in and see her today. Fortunately there is no signs of active infection which is great news and overall very pleased. She is going to be seeing her neurologist sometime shortly I believe it might even be on Monday. Nonetheless there can proceed  with the work-up as far as anything else going on with her eye the fact that her right eyelid is drooping. 3/25; right medial dorsal foot. She has superficial areas here that appear to be fully epithelialized she is using Medihoney and silver alginate and some combination. She has a new area on the mid left foot at roughly the level of the fifth metatarsal base 4/84/8; right medial dorsal foot. Still superficial areas here. I cleaned up 1 of these today she has been using I think mostly Medihoney but I would like her to use silver alginate. The area on the mid part of her foot also looks improved on the left. Since she was last here she tells me that she noted pus in the area of her left foot. She told dialysis about this they did a culture and ultimately she is on IV antibiotics but were not sure which one. They referred her to [name] he said that he did not need to follow her. I have not really verified any of this and I do not know what antibiotic she is on at dialysis 4/29; patient has been using Medihoney to her wounds. She reports taking off the toenail to her left second toe.  She states that home health discharged her and she has not had help with dressing changes. She denies any signs and symptoms of infection. 5/13; 2-week follow-up. Miraculously the wound on the right foot dorsally is healed. She has an area on the tip of her left third toe and an area in the left midfoot. 5/26; patient presents for 2-week follow-up. She has 2 open wounds 1 to her plantar foot and the other to the left third toe. She has noticed increased swelling and redness to the toe wound. It is unclear exactly what she is doing for dressing changes as she has multiple dressing options at home. She states that it is easiest to do [facility] on the plantar wound and Prisma or Medihoney on the toe wound. 6/3; saw Dr. Maryjane Hurter week. She put her on Keflex because of the swelling I think of the left second toe. She  arrives today with new breakdown x3 on the dorsal right foot which is disappointing. She also complains of pain in the right ankle, 3 liquid stools per day, chills without fever 6/17; patient was in [place]. I think a little more ambulatory than I would like to hear. In any case she still has the 3 small open areas on the dorsal part of her foot on the right medial leg these look about the same as last time. She is supposed to be using Prisma but I think used Medihoney. She has an area on the left lateral plantar foot which had undermining under her skin and a blood blister with the 2 areas communicating today. And finally an area on the tip of her left second toe 6/30; patient has 3 small areas that look better than the last time on the right dorsal foot. She uses a combination of her products including collagen, Hydrofera Blue and the Medihoney nevertheless they look better. The area on the left midfoot might have been healed although she pulled some skin off superficial open area. The left second toe still does not look that healthy. Open areas on the tips of the 7/21; the areas on her right dorsal foot are still present although they look benign. We are using silver collagen although she may be using any of the products she had at home. The real problem here is the left second toe. Necrotic wound over the distal phalanx and distal interphalangeal joint. This probes directly to bone. She arrives in clinic today with an interesting story. She went to [place] last week. She became ill there with refractory nausea and vomiting. She went to an urgent care and then the ER. I am able to see parts of this in Care Everywhere. She had 2 blood cultures done both of which ultimately showed MRSA. This was sensitive to daptomycin and linezolid and vancomycin. Her white count was 4.8 hemoglobin 10.4. X-ray of the left foot showed prior resection of the left fifth metatarsal no evidence of osteomyelitis. Her  C-reactive protein on 10/13/2020 was 104.8. She refused admission to the hospital as she was to board her flight to return to [place]. She dialyzed this morning I am not certain why she did not bring this to their attention Electronic Signature(s) Signed: 10/20/2020 5:24:41 PM By: Linton Ham MD Entered By: Linton Ham on 10/20/2020 14:06:23 -------------------------------------------------------------------------------- Physical Exam Details Patient Name: Date of Service: [name] 10/20/2020 12:30 PM Medical Record Number: [id number] Patient Account Number: [id number] Date of Birth/Sex: Treating RN: [birth year] (50 y.o. F) Deaton,  Bobbi Primary Care Provider: [name], [name] Other Clinician: Referring Provider: Treating Provider/Extender: [name], [name] Weeks in Treatment: 111 Constitutional Patient is hypertensive.. Pulse regular and within target range for patient.Marland Kitchen Respirations regular, non-labored and within target range.. Temperature is normal and within the target range for the patient.. She does not look to be systemically unwell. Notes Wound exam Her left plantar foot is healed. The real problem is on the distal left second toe. Necrotic wound that probes to bone. I think there is cellulitis spreading into her left forefoot which I have marked. The areas on her right dorsal foot look about the same perhaps some better. Nothing looks ominous here. Electronic Signature(s) Signed: 10/20/2020 5:24:41 PM By: Linton Ham MD Entered By: Linton Ham on 10/20/2020 14:07:25 -------------------------------------------------------------------------------- Physician Orders Details Patient Name: Date of Service: [name] 10/20/2020 12:30 PM Medical Record Number: [id number] Patient Account Number: [id number] Date of Birth/Sex: Treating RN: April 15, 1970 (50 y.o. [name], Meta.Reding Primary Care Provider: [name], [name] Other  Clinician: Referring Provider: Treating Provider/Extender: [name], [name] Weeks in Treatment: [phone] Verbal / Phone Orders: No Diagnosis Coding ICD-10 Coding Code Description L97.514 Non-pressure chronic ulcer of other part of right foot with necrosis of bone E10.621 Type 1 diabetes mellitus with foot ulcer L97.521 Non-pressure chronic ulcer of other part of left foot limited to breakdown of skin L97.529 Non-pressure chronic ulcer of other part of left foot with unspecified severity Follow-up Appointments ppointment in 1 week. - ***Back next Wednesday 10/26/2020**** Dr. Dellia Nims Return A Pick up oral antibiotics and start Vancomycin at dialysis center. If fever, redness increases, odor, drainage, or pain to go Emergency Department. Bathing/ Shower/ Hygiene May shower and wash wound with soap and water. Edema Control - Lymphedema / SCD / Other Bilateral Lower Extremities Elevate legs to the level of the heart or above for 30 minutes daily and/or when sitting, a frequency of: - throughout the day Avoid standing for long periods of time. Moisturize legs daily. - with dressing changes Off-Loading Open toe surgical shoe to: - to both feet Other: - minimal weight bearing right foot Wound Treatment Wound #51 - T Second oe Wound Laterality: Left Cleanser: Soap and Water Every Other Day/30 Days Discharge Instructions: May shower and wash wound with dial antibacterial soap and water prior to dressing change. Prim Dressing: KerraCel Ag Gelling Fiber Dressing, 4x5 in (silver alginate) Every Other Day/30 Days ary Discharge Instructions: Apply silver alginate to wound bed as instructed Secondary Dressing: Woven Gauze Sponge, Non-Sterile 4x4 in (DME) (Generic) Every Other Day/30 Days Discharge Instructions: Apply over primary dressing as directed. Secured With: Child psychotherapist, Sterile 2x75 (in/in) Every Other Day/30 Days Discharge Instructions: Secure with stretch  gauze as directed. Secured With: Paper Tape, 1x10 (in/yd) Every Other Day/30 Days Discharge Instructions: Secure dressing with tape as directed. Wound #52 - Foot Wound Laterality: Right Cleanser: Soap and Water Every Other Day/30 Days Discharge Instructions: May shower and wash wound with dial antibacterial soap and water prior to dressing change. Prim Dressing: Promogran Prisma Matrix, 4.34 (sq in) (silver collagen) Every Other Day/30 Days ary Discharge Instructions: Moisten collagen with saline or hydrogel Secondary Dressing: Woven Gauze Sponge, Non-Sterile 4x4 in (DME) (Generic) Every Other Day/30 Days Discharge Instructions: Apply over primary dressing as directed. Secured With: Elastic Bandage 4 inch (ACE bandage) Every Other Day/30 Days Discharge Instructions: Secure with ACE bandage as directed. Secured With: Clinical biochemist  Bandage, Sterile 2x75 (in/in) Every Other Day/30 Days Discharge Instructions: Secure with stretch gauze as directed. Secured With: Paper Tape, 1x10 (in/yd) Every Other Day/30 Days Discharge Instructions: Secure dressing with tape as directed. Laboratory erobe culture (MICRO) - culture left second toe wound. - (ICD10 E10.621 - Type 1 diabetes Bacteria identified in Unspecified specimen by A mellitus with foot ulcer) LOINC Code: 834-1 Convenience Name: Areobic culture-specimen not specified Patient Medications llergies: Cleocin, lisinopril A Notifications Medication Indication Start End cellulitis left foot mrsa 10/20/2020 linezolid DOSE oral 600 mg tablet - 1 tablet oral bid for 4 doses Electronic Signature(s) Signed: 10/20/2020 2:13:23 PM By: Linton Ham MD Entered By: Linton Ham on 10/20/2020 14:13:21 -------------------------------------------------------------------------------- Problem List Details Patient Name: Date of Service: GA [name] [name] 10/20/2020 12:30 PM Medical Record Number: [id number] Patient Account Number:  [id number] Date of Birth/Sex: Treating RN: 1970/10/19 (50 y.o. [name], Tammi Klippel Primary Care Provider: [name], [name] Other Clinician: Referring Provider: Treating Provider/Extender: [name], [name] Weeks in Treatment: 111 Active Problems ICD-10 Encounter Code Description Active Date MDM Diagnosis L97.514 Non-pressure chronic ulcer of other part of right foot with necrosis of bone 09/03/2018 No Yes E10.621 Type 1 diabetes mellitus with foot ulcer 09/24/2018 No Yes L97.529 Non-pressure chronic ulcer of other part of left foot with unspecified severity 07/29/2020 No Yes M86.172 Other acute osteomyelitis, left ankle and foot 10/20/2020 No Yes Inactive Problems ICD-10 Code Description Active Date Inactive Date M86.671 Other chronic osteomyelitis, right ankle and foot 09/03/2018 09/03/2018 L97.411 Non-pressure chronic ulcer of right heel and midfoot limited to breakdown of skin 09/17/2019 09/17/2019 L97.521 Non-pressure chronic ulcer of other part of left foot limited to breakdown of skin 04/20/2019 04/20/2019 L97.812 Non-pressure chronic ulcer of other part of right lower leg with fat layer exposed 02/24/2019 02/24/2019 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 10/20/2020 12:30 PM Medical Record Number: [id number] Patient Account Number: [id number] Date of Birth/Sex: Treating RN: [birth year] (50 y.o. [name] Primary Care  Provider: [name], [name] Other Clinician: Referring Provider: Treating Provider/Extender: [name], [name] Weeks in Treatment: 111 Subjective History of Present Illness (HPI) When41 year old diabetic who is known to have type 1 diabetes which is poorly controlled last hemoglobin A1c was 11%. She comes in with a ulcerated area on the left lateral foot which has been there for over 6 months. Was recently she has been treated by [name] of podiatry who saw her last on 05/28/2016. Review of his notes revealed that the patient had incision and drainage with placement of antibiotic beads to the left foot on 04/11/2016 for possible osteomyelitis of the cuboid bone. Over the last year she's had a history of amputation of the left fifth toe and a femoropopliteal popliteal bypass graft somewhere in April 2017. 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 His note [name] mentions that the patient has been referred to me for further wound care and possibly great candidate for hyperbaric oxygen therapy due to recurrent osteomyelitis. However we do not have any x-rays of  biopsy reports confirming this. He has been on several antibiotics including Bactrim and most recently is on doxycycline for an MRSA. I understand, the patient was not a candidate for IV antibiotics as she has had previous PICC lines which resulted in blood clots in both arms. There was a x-ray report dated 04/04/2016 on [name] notes which showed evidence of fifth ray resection left foot with osteolytic changes noted to the fourth metatarsal and cuboid bone on the left. 06/13/2016 -- had a left foot x-ray which showed no acute fracture or dislocation and no definite radiographic evidence of osteomyelitis. Advanced osteopenia was seen. 06/20/2016 -- she has noticed a new wound on the right plantar foot in the region where she had a callus before. 06/27/16- the patient did have her x-ray of the right foot  which showed no findings to suggest osteomyelitis. She saw her endocrinologist, [name], yesterday. Her A1c in January was 11. He also indicates mismanagement and noncompliance regarding her diabetes. She is currently on Bactrim for a lip infection. She is complaining of nausea, vomiting and diarrhea. She is unable to articulate the exact orders or dosing of the Bactrim; it is unclear when she will complete this. 07/04/2016 -- results from [facility] of ABIs with ankle waveforms were noted from 02/14/2016. The examination done on 06/27/2015 showed noncompressible ABIs with the right being 1.45 and the left being 1.33. The present examination showed a right ABI of 1.19 on the left of 1.33. The conclusion was that right normal ABI in the lower extremity at rest however compared to previous study which was noncompressible ABI may be falsely elevated side suggesting medial calcification. The left ABI suggested medial calcification. 08/01/2016 -- the patient had more redness and pain on her right foot and did not get to come to see as noted she see her PCP or go to the ER and decided to take some leftover metronidazole which she had at home. As usual, the patient does report she feels and is rather noncompliant. 08/08/2016 -- -- x-ray of the right foot -- FINDINGS:Transmetatarsal amputation is noted. No bony destruction is noted to suggest osteomyelitis. IMPRESSION: No evidence of osteomyelitis. Postsurgical changes are seen. MRI would be more sensitive for possible bony changes. Culture has grown Serratia Marcescens -- sensitive to Bactrim, ciprofloxacin, ceftazidime she was seen by [name] on 08/06/2016. He did not find any exposed bone, muscle, tendon, ligament or joint. There was no malodor and he did a excisional debridement in the office. ============ Old notes: 50 year old patient who is known to the wound clinic for a while had been away from the wound clinic since 09/01/2014. Over  the last several months she has been admitted to various hospitals including [facility] [place]. She was treated for a right metatarsal osteomyelitis with a transmetatarsal amputation and this was done about 2 months ago. He has a small ulcerated area on the right heel and she continues to have an ulcerated area on the left plantar aspect of the foot. The patient was recently admitted to the [facility] hospital group between 7/12 and 10/18/2014. she was given 3 weeks of IV vancomycin and was to follow-up with her surgeons at [facility] and also took oral vancomycin for C. difficile colitis. 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 MRI of the left foot done on 10/14/2014 showed no abscess or osteomyelitis. 04/27/15; this is a patient we know from previous stays in the wound care center. She is a type I diabetic I am not sure of her control currently. Since the last time I saw her she is had a right transmetatarsal amputation and has no wounds on her right foot and has no open wounds. She is been followed at the wound care center at [facility] in [place]. She comes today with the desire to undergo hyperbaric treatment locally. Apparently one of her wound care providers in [place] has suggested hyperbarics. This is in response to an MRI from 04/18/15 that showed increased marrow signal and loss of the proximal fifth metatarsal cortex evidence of osteomyelitis with likely early osteomyelitis in the cuboid bone as well. She has a large wound over the base of the fifth metatarsal. She also has a eschar over her the tips of her toes on 1,3 and 5. She does not have peripheral pulses and apparently is going for an  angiogram tomorrow which seems reasonable. After this she is going to infectious disease at [facility]. They have been using Medihoney to the large wound on the lateral aspect of the left foot to. The patient has known Charcot deformity from diabetic neuropathy. She also has known diabetic PAD. Surprisingly I can't see that she has had any recent antibiotics, the patient states the last antibiotic she had was at the end of November for 10 days. I think this was in response to culture that showed group G strep although I'm not exactly sure where the culture was from. She is also had arterial studies on 03/29/15. This showed a right ABI of 1.4 that was noncompressible. Her left ABI was 0.73. 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 It was not felt that arterial inflow was adequate for healing of a foot ulcer. Her Doppler waveforms looked monophasic ===== READMISSION 02/28/17; this is in an now 50 year old woman we've had at several different occasions in this clinic. 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 She has a remote ex-smoker. She was last seen in this clinic by Dr. Con Memos I think in May. More recently she is been followed by her podiatrist [name] an infectious disease Dr. Megan Salon. She has 2 open wounds the major one is over the right first metatarsal head she also has a wound on the left plantar foot. an MRI of the right foot on 01/01/17 showed a soft tissue ulcer along the plantar aspect of the first metatarsal base consistent with osteomyelitis of the first metatarsal stump. Dr. 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 According to the patient this is been open for slightly over a month. She has been on a combination of Cipro 500 twice a day, Zyvox 600 twice a day and Flagyl 500 3 times a day for over a month now as directed by Dr. Megan Salon.  cultures of the right foot earlier this year showed MRSA in January and Serratia in May. January also had a few viridans strep. Recent x-rays of both feet were done and [name] office and I don't have these reports. The patient has known PAD and has a history of aleft femoropopliteal bypass in April 2017. She underwent a right TMA in June 2016 and a left fifth ray amputation in April 2017 the patient has an  insulin pump and she works closely with her endocrinologist Dr. Dwyane Dee. In spite of this the last hemoglobin A1c I can see is 10.1 on 01/01/2017. She is being referred by 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 She is been using Medihoney to this area and also an area on the left midfoot. She is using healing sandals bilaterally. ABIs in this clinic at the left posterior tibial was 1.1 noncompressible on the right READMISSION Non invasive vascular NOVANT 5/18 Aftercare following surgery of the circulatory system Procedure Note - Interface, External Ris In - 08/13/2016 11:05 AM EDT Procedure: Examination consists of physiologic resting arterial pressures of the brachial and ankle arteries bilaterally with continuous wave Doppler waveform analysis. Previous: Previous exam performed on 02/14/16 demonstrated ABIs of Rt = 1.19 and Lt = 1.33. Right: ABI = non-compressible PT 1.47 DP. S/P transmet amputation. , Left: ABI = 1.52, 2nd digit pressure = 87 mmHg Conclusions: Right: ABI (>1.3) may be falsely elevated, suggesting medial calcification. Left: ABI (>1.3) may be falsely elevated, suggesting medial calcification The patient is a now 50 year old type I diabetic is had multiple issues her graded to chronic diabetic foot ulcers. 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 We had her in the  clinic lastin November. At that point she had wounds on her bilateral feet.she had wanted to try hyperbarics however the healogics review process denied her because she hadn't followed up with her vascular surgeon for her left femoropopliteal bypass. The bypass was done by Dr. Raul Del at [facility]. We made her a follow-up with Dr. Raul Del however she did not keep the appointment and therefore she was not approved The patient shows me a small wound on her left fourth metatarsal head on her phone. She developed rapid discoloration in the plantar aspect of the left foot and she was admitted to hospital from 2/2 through 05/10/17 with wet gangrene of the left foot osteomyelitis of the fourth metatarsal heads. She was admitted acutely ill with a temperature of 103. She was started on broad-spectrum vancomycin and cefepime. On 05/06/17 she was taken to the OR by [name] her podiatric surgeon for an incision and drainage irrigation of the left foot wound. Cultures from this surgery revealed group be strep and anaerobes. she was seen by Dr.Xu of orthopedic surgery and scheduled for a below-knee amputation which she u refused. Ultimately she was discharged on Levaquin and Flagyl for one month. MRI 05/05/17 done while she was in the hospital showed abscess adjacent to the fourth metatarsal head and neck small abscess around the fourth flexor tendon. Inflammatory phlegmon and gas in the soft tissues along the lateral aspect of the fourth phalanx. Findings worrisome for osteomyelitis involving the fourth proximal and middle phalanx and also the third and fourth metatarsals. Finally the patient had actually shortly before this followed up with Dr. Raul Del at no time on 04/29/17. He felt that her left femoropopliteal bypass was patent he felt that her left-sided toe pressures more than adequate for healing a wound on the left foot. This was before her acute presentation. Her noninvasive diabetes are listed  above. 05/28/17; she is started hyperbarics. The patient tells me that for some reason she was not actually on Levaquin but I think on ciprofloxacin. She was on Flagyl. She only started her Levaquin yesterday due to some difficulty with the  pharmacy and perhaps her sister picking it up. She has an appointment with [name] tomorrow and with infectious disease early next week. She has no new complaints 06/06/17; the patient continues in hyperbarics. She saw [name] on 05/29/17 who is her podiatric surgeon. He is elected for a transmetatarsal amputation on 06/27/17. I'm not sure at what level he plans to do this amputation. The patient is unaware ooShe also saw Dr. Megan Salon of infectious disease who elected to continue her on current antibiotics I think this is ciprofloxacin and Flagyl. I'll need to clarify with her tomorrow if she actually has this. We're using silver alginate to the actual wound. Necrotic surface today with material under the flap of her foot. ooOriginal MRI showed abscesses as well as osteomyelitis of the proximal and middle fourth phalanx and the third and fourth metatarsal heads 06/11/17; patient continues in hyperbarics and continues on oral antibiotics. She is doing well. The wound looks better. The necrotic part of this under the flap in her superior foot also looks better. she is been to see [name]. I haven't had a chance to look at his note. Apparently he has put the transmetatarsal amputation on hold her request it is still planning to take her to the OR for debridement and product application ACEL. I'll see if I can find his note. I'll therefore leave product ordering/requests to [name] for now. I was going to look at Dermagraft 06/18/17-she is here in follow-up evaluation for bilateral foot wounds. She continues with hyperbaric therapy. She states she has been applying manuka honey to the right plantar foot and alternate manuka honey and silver alginate to the left  foot, despite our orders. We will continue with same treatment plan and she will follo up next week. 06/25/17; I have reviewed [name] last note from 3/11. She has operative debridement in 2 days' time. By review his note apparently they're going to place there is skin over the majority of this wound which is a good choice. She has a small satellite area at the most proximal part of this wound on the left plantar foot. The area on the right plantar foot we've been using silver alginate and it is close to healing. 07/02/17; unfortunately the patient was not easily approved for [name] proposed surgery. I'm not completely certain what the issue is. She has been using silver alginate to the wound she has completed a first course of hyperbarics. She is still on Levaquin and Flagyl. I have really lost track of the time course here.I suspect she should have another week to 2 of antibiotics. I'll need to see if she is followed up with infectious disease Dr. Megan Salon 07/09/17; the patient is followed up with Dr. Megan Salon. 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 She continues on Levaquin and metronidazole continuing both of these for now I think she is been on fr about 6 weeks. She still has some drainage but no pain. No fever. Her had been plans for her to go to the OR for operative debridement with her podiatrist [name], I am not exactly sure where that is. I'll probably slip a note to [name] today. I note that she follows with Dr. Dwyane Dee of endocrinology. We have her recertified for hyperbaric oxygen. I have not heard about Dermagraft however I'll see if [name] is planning a skin substitute as well 07/16/17; the patient tells me she is just about  out of Levaquin. I'll need to check Dr. Hale Bogus last notes on this. She states she has plenty of Flagyl however. She comes in today complaining of pain in the right lateral foot which she said lasted for about a  day. The wound on the right foot is actually much more medially. She also tells me that the [facility] cost a lot of pain in the left foot wound and she turned back to silver alginate. Finally Dermagraft has a $010 per application co-pay. She cannot afford this 07/23/17; patient arrives today with the wound not much smaller. There is not much new to add. She has not heard from [name] all try to put in a call to them today. She was asking about Dermagraft again and she has an over $272 per application co-pay she states that she would be willing to try to do a payment plan. I been tried to avoid this. We've been using silver alginate, I'll change to [facility] 07/30/17-She is here in follow-up evaluation for left foot ulcer. She continues hyperbaric medicine. The left foot ulcer is stable we will continue with same treatment plan 08/06/17; she is here for evaluation of her left foot ulcer. Currently being treated for hyperbarics or underlying osteomyelitis. She is completed antibiotics. The left foot ulcer is better smaller with healthier looking granulation. For various reasons I am not really clear on we never got her back to the OR with [name]. He did not respond to my secure text message. Nevertheless I think that surgery on this point is not necessary nor am I completely clear that a skin substitute is necessary The patient is complaining about pain on the outside of her right foot. 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 There is no erythema. She also states the foot is warm versus her other part of her upper leg and this is largely true. It is not totally clear to me what's causing this. She thinks it's different from her usual neuropathy pain 08/13/17; she arrives in clinic today with a small wound which is superficial on her right first metatarsal head. 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 She tells [place] she was up on her feet over the Mother's Day  celebration. ooThe large wound is on the left foot. Continues with hyperbarics for underlying osteomyelitis. We're using Hydrofera Blue. She asked me today about where we were with Dermagraft. I had actually excluded this because of the co-pay however she wants to assume this therefore I'll recheck the co-pay an order for next week. 08/20/17; the patient agreed to accept the co-pay of the first Apligraf which we applied today. She is disappointed she is finishing hyperbarics will run this through the insurance on the extent of the foot infection and the extent of the wound that she had however she is already had 60 dive's. Dermagraft No. 1 08/27/17; Dermagraft No. 2. She is not eligible for any more hyperbaric treatments this month. She reports a fair amount of drainage and she actually changed to the external dressings without disturbing the direct contact layer 09/03/17; the patient arrived in clinic today with the wound superficially looking quite healthy. Nice vibrant red tissue with some advancing epithelialization although not as much adherence of the flap as I might like. However she noted on her own fourth toe some bogginess and she brought that to our attention. Indeed this was boggy feeling like a possibility of subcutaneous fluid. She stated that this was similar to  how an issue came up on the lateral foot that led to her fifth ray amputation. She is not been unwell. We've been using Dermagraft 09/10/17; the culture that I did not last week was MRSA. She saw Dr. Megan Salon this morning who is going to start her on vancomycin. I had sent him a secure a text message yesterday. I also spoke with her podiatric surgeon [name] about surgery on this foot the options for conserving a functional foot etc. Promised me he would see her and will make back consultation today. Paradoxically her actual wound on the plantar aspect of her left foot looks really quite good. I had given her 5 days worth of  Baxdella to cover her for MRSA. Her MRI came back showing osteomyelitis within the third metatarsal shaft and head and base of the third and fourth proximal phalanx. She had extensive inflammatory changes throughout the soft tissue of the lateral forefoot. With an ill-defined fluid around the fourth metatarsal extending into the plantar and dorsal soft tissues 09/19/17; the patient is actually on oral Septra and Flagyl. She apparently refused IV vancomycin. She also saw [name] at my request who is planning her for a left BKA sometime in mid July. MRI showed osteomyelitis within the third metatarsal shaft and head and the basis of the third and fourth proximal phalanx. I believe there was felt to be possible septic arthritis involving the third MTP. 09/26/17; the patient went back to Dr. Megan Salon at my suggestion and is now receiving IV daptomycin. Her wound continues to look quite good making the decision to proceed with a transmetatarsal amputation although more difficult for the patient. I believe in my extensive discussions with her she has a good sense of the pros and cons of this. I don't NV the tuft decision she has to make. She has an appointment with [name] I believe in mid July and I previously spoken to him about this issue Has we had used 3 previous Dermagraft. Given the condition of the wound surface I went ahead and added the fourth one today area and I did this not fully realizing that she'll be traveling to [place] next week. I'm hopeful she can come back in 2 weeks 10/21/17; Her same Dermagraft on for about 3-1/2 weeks. In spite of this the wound arrives looking quite healthy. There is been a lot of healing dimensions are smaller. Looking at the square shaped wound she has now there is some undermining and some depth medially under the undermining although I cannot palpate any bone. No surrounding infection is obvious. She has difficult questions about how to look at this going  forward vis--vis amputations versus continued medical therapy. T be truthful the wound is looks so o healthy and it is continued to contract. Hard to justify foot surgery at this point although I still told her that I think it might come to that if we are not able to eradicate the underlying MRSA. She is still highly at risk and she understands this 11/06/17 on evaluation today patient appears to be doing better in regard to her foot ulcer. She's been tolerating the dressing changes without complication. Currently she is here for her Dermagraft #6. Her wound continues to make excellent progress at this point. She does not appear to have any evidence of infection which is good news. 11/13/17 on evaluation today patient appears to be doing excellent at this time. She is here for repeat Dermagraft application. This is #7. Overall her  wound seems to be making great progress. 12/05/17; the patient arrives with the wound in much better condition than when I last saw this almost 6 weeks ago. She still has a small probing area in the left metatarsal head region on the lateral aspect of her foot. We applied her last Dermagraft today. ooSince the last time she is here she has what appears to a been a blood blister on the plantar aspect of left foot although I don't see this is threatening. There is also a thick raised tissue on the right mid metatarsal head region. This was not there I don't think the last time she was here 3 weeks ago. 12/12/17; the patient continues to have a small programming area in the left metatarsal head region on the lateral aspect of her foot which was the initial large surgical wound. I applied her last Apligraf last week. I'm going to use Endoform starting today ooUnfortunately she has an excoriated area in the left mid foot and the right mid foot. The left midfoot looks like a blistered area this was not opened last week it certainly is open today. Using silver alginate on these  areas. She promises me she is offloading this. 12/19/17; the small probing area in the left metatarsal head eyes think is shallower. In general her original wound looks better. We've been using Endoform. The area inferiorly that I think was trauma last week still requires debridement a lot of nonviable surface which I removed. She still has an open open area distally in her foot ooSimilarly on the right foot there is tightly adherent surface debris which I removed. Still areas that don't look completely epithelialized. This is a small open area. We used silver alginate on these areas 12/26/2017; the patient did not have the supplies we ordered from last week including the Endoform. The original large wound on the left lateral foot looks healthy. She still has the undermining area that is largely unchanged from last week. She has the same heavily callused raised edged wounds on the right mid and left midfoot. Both of these requiring debridement. We have been using silver alginate on these areas 01/02/2018; there is still supply issues. We are going to try to use Prisma but I am not sure she actually got it from what she is saying. She has a new open area on the lateral aspect of the left fourth toe [previous fifth ray amputation]. Still the one tunneling area over the fourth metatarsal head. The area is in the midfoot bilaterally still have thick callus around them. She is concerned about a raised swelling on the lateral aspect of the foot. However she is completely insensate 01/10/2018; we are using Prisma to the wounds on her bilateral feet. Surprisingly the tunneling area over the left fourth metatarsal head that was part of her original surgery has closed down. She has a small open area remaining on the incision line. 2 open areas in the midfoot. 02/10/2018; the patient arrives back in clinic after a month hiatus. She was traveling to visit family in [place]. Is fairly clear she was not offloading  the areas on her feet. The original wound over the left lateral foot at the level of metatarsal heads is reopened and probes medially by about a centimeter or 2. She notes that a week ago she had purulent drainage come out of an area on the left midfoot. Paradoxically the worst area is actually on the right foot is extensive with purulent drainage. We will  use silver alginate today 02/17/2018; the patient has 3 wounds one over the left lateral foot. She still has a small area over the metatarsal heads which is the remnant of her original surgical wound. This has medial probing depth of roughly 1.4 cm somewhat better than last week. The area on the right foot is larger. We have been using silver alginate to all areas. The area on the right foot and left foot that we cultured last week showed both Klebsiella and Proteus. Both of these are quinolone sensitive. The patient put her's self on Bactrim and Flagyl that she had left hanging around from prior antibiotic usages. She was apparently on this last week when she arrived. I did not realize this. Unfortunately the Bactrim will not cover either 1 of these organisms. We will send in Cipro 500 twice daily for a week 03/04/2018; the patient has 2 wounds on the left foot one is the original wound which was a surgical wound for a deep DFU. At one point this had exposed bone. She still has an area over the fourth metatarsal head that probes about 1.4 cm although I think this is better than last week. I been using silver nitrate to try and promote tissue adherence and been using silver alginate here. ooShe also has an area in the left midfoot. This has some depth but a small linear wound. Still requiring debridement. ooOn the right midfoot is a circular wound. A lot of thick callus around this area. ooWe have been using silver alginate to all wound areas ooShe is completed the ciprofloxacin I gave her 2 weeks ago. 03/11/2018; the patient continues to have  2 open areas on the left foot 1 of which was the original surgical wound for a deep DFU. Only a small probing area remains although this is not much different from last week we have been using silver alginate. The other area is on the midfoot this is smaller linear but still with some depth. We have been using silver alginate here as well ooOn the right foot she has a small circular wound in the mid aspect. This is not much smaller than last time. We have been using silver alginate here as well 03/18/2018; she has 3 wounds on the left foot the original surgical wound, a very superficial wound in the mid aspect and then finally the area in the mid plantar foot. She arrives in today with a very concerning area in the wound in the mid plantar foot which is her most proximal wound. There is undermining here of roughly 1-1/2 cm superiorly. Serosanguineous drainage. She tells me she had some pain on for over the weekend that shot up her foot into her thigh and she tells me that she had a nodule in the groin area. ooShe has the single wound in the right foot. ooWe are using endoform to both wound areas 03/24/2018; the patient arrives with the original surgical wound in the area on the left midfoot about the same as last week. There is a collection of fluid under the surface of the skin extending from the surgical wound towards the midfoot although it does not reach the midfoot wound. The area on the right foot is about the same. Cultures from last week of the left midfoot wound showed abundant Klebsiella abundant Enterococcus faecalis and moderate methicillin resistant staph I gave her Levaquin but this would have only covered the Klebsiella. She will need linezolid 04/01/2018; she is taking linezolid but for the first  few days only took 1 a day. I have advised her to finish this at twice daily dosing. In any case all of her wounds are a lot better especially on the left foot. The original surgical wound  is closed. The area on the left midfoot considerably smaller. The area on the right foot also smaller. 04/08/2018; her original surgical wound/osteomyelitis on the left foot remains closed. She has area on the left foot that is in the midfoot area but she had some streaking towards this. This is not connected with her original wound at least not visually. ooSmall wound on the right midfoot appears somewhat smaller. 04/15/18; both wounds looks better. Original wound is better left midfoot. Using silver alginate 1/21; patient states she uses saltwater soak in, stones or remove callus from around her wounds. She is also concerned about a blood blister she had on the left foot but it simply resolved on its own. We've been using silver alginate 1/28; the patient arrives today with the same streaking area from her metatarsals laterally [the site of her original surgical wound] down to the middle of her foot. There is some drainage in the subcutaneous area here. This concerns me that there is actually continued ongoing infection in the metatarsals probably the fourth and third. This fixates an MRI of the foot without contrast [chronic renal failure] ooThe wound in the mid part of the foot is small but I wonder whether this area actually connects with the more distal foot. ooThe area on the right midfoot is probably about the same. Callus thick skin around the small wound which I removed with a curette we have been using silver alginate on both wound areas 2/4; culture I did of the draining site on the left foot last time grew methicillin sensitive staph aureus. MRI of the left foot showed interval resolution of the findings surrounding the third metatarsal joint on the prior study consistent with treated osteomyelitis. Chronic soft tissue ulceration in the plantar and lateral aspect of the forefoot without residual focal fluid collection. No evidence of recurrent osteomyelitis. Noted to have the previous  amputation of the distal first phalanx and fifth ray MRI of the right foot showed no evidence of osteomyelitis I am going to treat the patient with a prolonged course of antibiotics directed against MSSA in the left foot 2/11; patient continues on cephalexin. She tells me she had nausea and vomiting over the weekend and missed 2 days. In general her foot looks much the same. She has a small open area just below the left fourth metatarsal head. A linear area in the left midfoot. Some discoloration extending from the inferior part of this into the left lateral foot although this appears to be superficial. She has a small area on the right midfoot which generally looks smaller after debridement 2/18; the patient is completing his cephalexin and has another 2 days. She continues to have open areas on the left and right foot. 2/25; she is now off antibiotics. The area on the left foot at the site of her original surgical wound has closed yet again. She still has open areas in the mid part of her foot however these appear smaller. The area on the right mid foot looks about the same. We have been using silver alginate She tells me she had a serious hypoglycemic spell at home. She had to have EMS called and get IV dextrose 3/3; disappointing on the left lateral foot large area of necrotic tissue surrounding the  linear area. This appears to track up towards the same original surgical wound. Required extensive debridement. The area on the right plantar foot is not a lot better also using silver 3/12; the culture I did last time showed abundant enterococcus. I have prescribed Augmentin, should cover any unrecognized anaerobes as well. In addition there were a few MRSA and Serratia that would not be well covered although I did not want to give her multiple antibiotics. She comes in today with a new wound in the right midfoot this is not connected with the original wound over her MTP a lot of thick callus tissue  around both wounds but once again she said she is not walking on these areas 3/17-Patient comes in for follow-up on the bilateral plantar wounds, the right midfoot and the left plantar wound. Both these are heavily callused surrounding the wounds. We are continuing to use silver alginate, she is compliant with offloading and states she uses a wheelchair fairly often at home 3/24; both wound areas have thick callus. However things actually look quite a bit better here for the majority of her left foot and the right foot. 3/31; patient continues to have thick callused somewhat irritated looking tissue around the wounds which individually are fairly superficial. There is no evidence of surrounding infection. We have been using silver alginate however I change that to [facility] today 4/17; patient returns to clinic after having a scare with Covid she tested negative in her primary doctor's office. She has been using Hydrofera Blue. She does not have an open area on the right foot. On the left foot she has a small open area with the mid area not completely viable. She showed me pictures of what looks like a hemorrhagic blister from several days ago but that seems to have healed over this was on the lateral left foot 4/21; patient comes in to clinic with both her wounds on her feet closed. However over the weekend she started having pain in her right foot and leg up into the thigh. She felt as though she was running a low-grade fever but did not take her temperature. She took a doxycycline that she had leftover and yesterday a single Septra and metronidazole. She thinks things feel somewhat better. 4/28; duplex ultrasound I ordered last week was negative for DVT or superficial thrombophlebitis. She is completed the doxycycline I gave her. States she is still having a lot of pain in the right calf and right ankle which is no better than last week. She cannot sleep. She also states she has a temperature  of up to 101, coughing and complaining of visual loss in her bilateral eyes. Apparently she was tested for Covid 2 weeks ago at [facility] and that was negative. Readmission: 09/03/18 patient presents back for reevaluation after having been evaluated at the end of April regarding erythema and swelling of her right lower extremity. Subsequently she ended up going to the hospital on 07/29/18 and was admitted not to be discharged until 08/08/18. Unfortunately it was noted during the time that she was in the hospital that she did have methicillin-resistant Staphylococcus aureus as the infection noted at the site. It was also determined that she did have osteomyelitis which appears to be fairly significant. She was treated with vancomycin and in fact is still on IV vancomycin at dialysis currently. This is actually slated to continue until 09/12/18 at least which will be the completion of the six weeks of therapy. Nonetheless based on what  I'm seeing at this point I'm not sure she will be anywhere near ready to discontinue antibiotics at that time. Since she was released from the hospital she was seen by [name] who is her podiatrist on 08/27/18. His note specifically states that he is recommended that the patient needs of one knee amputation on the right as she has a life- threatening situation that can lead quickly to sepsis. The patient advised she would like to try to save her leg to which [name] apparently told her that this was against all medical advice. She also want to discontinue the Wound VAC which had been initiated due to the fact that she wasn't pleased with how the wound was looking and subsequently she wanted to pursue applying Medihoney at that time. He stated that he did not believe that the right lower extremity was salvageable and that the patient understood but would still like to attempt hyperbaric option therapy if it could be of any benefit. She was therefore referred back to [place] for  further evaluation. He plans to see her back next week. Upon inspection today patient has a significant amount purulent drainage noted from the wound at this point. The bone in the distal portion of her foot also appears to be extremely necrotic and spongy. When I push down on the bone it bubbles and seeps purulent drainage from deeper in the end of the foot. I do not think that this is likely going to heal very well at all and less aggressive surgical debridement were undertaken more than what I believe we can likely do here in our office. 09/12/2018; I have not seen this patient since the most recent hospitalization although she was in our clinic last week. I have reviewed some of her records from a complex hospitalization. 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 There is situation was complicated by MRSA bacteremia and acute on chronic renal failure now on dialysis. She is receiving vancomycin at dialysis. We started her on Dakin's wet-to-dry last week she is changing this daily. There is still purulent drainage coming out of her foot. Although she is apparently "agreeable" to a below-knee amputation which is been suggested by multiple clinicians she wants this to be done in [place]. She apparently has a telehealth visit with that provider sometime in late [place] 6/24. I have told her I think this is probably too long. Nevertheless I could not convince her to allow a local doctor to perform BKA. 09/19/2018; the patient has a large necrotic area on the right anterior foot. She has had previous transmetatarsal amputations. Culture I did last week showed MRSA nothing else she is on vancomycin at dialysis. She has continued leaking purulent drainage out of the distal part of the large circular wound on the right anterior foot. She apparently went to see Dr. 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 Somehow that translated  into her being referred to plastic surgery for debridement of the area. I gather she basically refused amputation although I do not have a copy of Dr. Berenice Primas notes. The patient really wants to have a trial of hyperbaric oxygen. I agreed with initial assessment in this clinic that this was probably too far along to benefit however if she is going to have plastic surgery I think she would benefit from ancillary hyperbaric oxygen. The issue here is that the patient has benefited as maximally as any patient I have ever  seen from hyperbaric oxygen therapy. Most recently she had exposed bone on the lateral part of her left foot after a surgical procedure and that actually has closed. She has eschared areas in both heels but no open area. She is remained systemically well. I am not optimistic that anything can be done about this but the patient is very clear that she wants an attempt. The attempt would include a wound VAC further debridements and hyperbaric oxygen along with IV antibiotics. 6/26; I put her in for a trial of hyperbaric oxygen only because of the dramatic response she has had with wounds on her left midfoot earlier this year which was a surgical wound that went straight to her bone over the metatarsal heads and also remotely the left third toe. We will see if we can get this through our review process and insurance. She arrives in clinic with again purulent material pouring out of necrotic bone on the top of the foot distally. There is also some concerning erythema on the front of the leg that we marked. It is bit difficult to tell how tender this is because of neuropathy. I note from infectious disease that she had her vancomycin extended. 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 She had the wound VAC on for part of the week. The rest of the time she is putting various things on this including Medihoney, "ionized water" silver sorb gel etc. 7/7; follow-up along  with HBO. She is still on vancomycin at dialysis. She has a large open area on the dorsal right foot and a small dark eschar area on her heel. There is a lot less erythema in the area and a lot less tenderness. From an infection point of view I think this is better. She still has a lot of necrosis in the remaining right forefoot [previous TMA] we are still using the wound VAC in this area 7/16; follow-up along with HBO. I put her on linezolid after she finished her vancomycin. We started this last Friday I gave her 2 weeks worth. I had the expectation that she would be operatively debrided by Dr. Marla Roe but that still has not happened yet. Patient phoned the office this week. She arrives for review today after HBO. The distal part of this wound is completely necrotic. Nonviable pieces of tendon bone was still purulent drainage. Also concerning that she has black eschar over the heel that is expanding. I think this may be indicative of infection in this area as well. She has less erythema and warmth in the ankle and calf but still an abnormal exam 7/21 follow-up along with HBO. I will renew her linezolid after checking a CBC with differential monitoring her blood counts especially her platelets. She was supposed to have surgery yesterday but if I am reading things correctly this was canceled after her blood sugar was found to be over 500. I thought Dr. Marla Roe who called me said that they were sending her to the ER but the patient states that was not the case. 7/28. Follow-up along with HBO. She is on linezolid I still do not have any lab work from dialysis even though I called last week. The patient is concerned about an area on her left lateral foot about the level of the base of her fifth metatarsal. I did not really see anything that ominous here however this patient is in [place] ability to point out problems that she is sensing and she has been  accurate in the past Finally she received a  call from Dr. Marla Roe who is referring her to another orthopedic surgeon stating that she is too booked up to take her to the operating room now. Was still using a wound VAC on the foot 8/3 -Follow-up after HBO, she is got another week of linezolid, she is to call ID for an appointment, x-rays of both feet were reviewed, the left foot x-ray with third MTP joint osteo- Right foot x-ray widespread osteo-in the right midfoot Right ankle x-ray does not show any active evidence of infection 8/11-Patient is seen after HBO, the wounds on the right foot appear to be about the same, the heel wound had some necrotic base over tendon that was debrided with a curette 8/21; patient is seen after HBO. The patient's wound on her dorsal foot actually looks reasonably good and there is substantial amount of epithelialization however the open area distally still has a lot of necrotic debris partially bone. I cannot really get a good sense of just how deep this probes under the foot. She has been pressuring me this week to order medical maggots through a company in [place] for her. The problem I have is there is not a defined wound area here. On the positive side there is no purulence. She has been to see infectious disease she is still on Septra DS although I have not had a chance to review their notes 8/28; patient is seen in conjunction with HBO. The wounds on her foot continued to improve including the right dorsal foot substantially the, the distal part of this wound and the area on the right heel. We have been using a wound VAC over this chronically. She is still on trimethoprim as directed by infectious disease 9/4; patient is seen in conjunction with HBO. Right dorsal foot wound substantially anteriorly is better however she continues to have a deep wound in the distal part of this that is not responding. We have been using silver collagen under border foam ooArea on the right plantar medial heel seems  better. We have been using Hydrofera Blue 12/12/18 on evaluation today patient appears to be doing about the same with regard to her wound based on prior measurements. She does have some necrotic tissue noted on the lateral aspect of the wound that is going require a little bit of sharp debridement today. This includes what appears to be potentially either severely necrotic bone or tendon. Nonetheless other than that she does not appear to have any severe infection which is good news 9/18; it is been 2 weeks since I saw this wound. She is tolerating HBO well. Continued dramatic improvement in the area on the right dorsal foot. She still has a small wound on the heel that we have been using Hydrofera Blue. She continues with a wound VAC 9/24; patient has to be seen emergently today with a swelling on her right lateral lower leg. She says that she told Dr. Evette Doffing about this and also myself on a couple of occasions but I really have no recollection of this. She is not systemically unwell and her wound really looked good the last time I saw this. She showed this to providers at dialysis and she was able to verify that she was started on cephalexin today for 5 doses at dialysis. She dialyzes on Tuesday Thursday and Saturday. 10/2; patient is seen in conjunction with HBO. The area that is draining on the right anterior medial tibia is more extensive.  Copious amounts of serosanguineous drainage with some purulence. We are still using the wound VAC on the original wound then it is stable. Culture I did of the original IandD showed MRSA I contacted dialysis she is now on vancomycin with dialysis treatments. I asked them to run a month 10/9; patient seen in conjunction with HBO. She had a new spontaneous open area just above the wound on the right medial tibia ankle. More swelling on the right medial tibia. Her wound on the foot looks about the same perhaps slightly better. There is no warmth spreading up her  leg but no obvious erythema. her MRI of the foot and ankle and distal tib-fib is not booked for next Friday I discussed this with her in great detail over multiple days. it is likely she has spreading infection upper leg at least involving the distal 25% above the ankle. She knows that if I refer her to orthopedics for infectious disease they are going to recommend amputation and indeed I am not against this myself. We had a good trial at trying to heal the foot which is what she wanted along with antibiotics debridement and HBO however she clearly has spreading infection [probably staph aureus/MRSA]. Nevertheless she once again tells me she wants to wait the left of the MRI. She still makes comments about having her amputation done in [place]. 10/19; arrives today with significant swelling on the lateral right leg. Last culture I did showed Klebsiella. Multidrug-resistant. Cipro was intermediate sensitivity and that is what I have her on pending her MRI which apparently is going to be done on Thursday this week although this seems to be moving back and forth. She is not systemically unwell. We are using silver alginate on her major wound area on the right medial foot and the draining areas on the right lateral lower leg 10/26; MRI showed extensive abscess in the anterior compartment of the right leg also widespread osteomyelitis involving osseous structures of the midfoot and portions of the hindfoot. Also suspicion for osteomyelitis anterior aspect of the distal medial malleolus. Culture I did of the purulence once again showed a multidrug-resistant Klebsiella. I have been in contact with nephrology late last week and she has been started on cefepime at dialysis to replace the vancomycin We sent a copy of her MRI report to Dr. Geroge Baseman in [place] who is an orthopedic surgeon. The patient takes great stock in his opinion on this. She says she will go to [place] to have her leg amputated if  Dr. Geroge Baseman does not feel there is any salvage options. 11/2; she still is not talk to her orthopedic surgeon in [place]. Apparently he will call her at [phone] this afternoon. The quality of this is she has not allowed me to refer her anywhere. She has been told over and over that she needs this amputated but has not agreed to be referred. She tells me her blood sugar was 600 last night but she has not been febrile. 11/9; she never did got a call from the orthopedic surgeon in [place] therefore that is off the radar. We have arranged to get her see orthopedic surgery at [facility]. She still has a lot of draining purulence coming out of the new abscess in her right leg although that probably came from the osteomyelitis in her right foot and heel. Meanwhile the original wound on the right foot looks very healthy. Continued improvement. The issue is that the last MRI showed osteomyelitis in her right foot extensively  she now has an abscess in the right anterior lower leg. There is nobody in [place] who will offer this woman anything but an amputation and to be honest that is probably what she needs. I think she still wants to talk about limb salvage although at this point I just do not see that. She has completed her vancomycin at dialysis which was for the original staph aureus she is still on cefepime for the more recent Klebsiella. She has had a long course of both of these antibiotics which should have benefited the osteomyelitis on the right foot as well as the abscess. 11/16; apparently [place] elective surgery is shut down because of COVID-19 pandemic. I have reached out to some contacts at [facility] to see if we can get her an orthopedic appointment there. I am concerned about continually leaving this but for the moment everything is static. In fact her original large wound on this foot is closing down. It is the abscess on the right anterior leg that continues to drain purulent  serosanguineous material. She is not currently on any antibiotics however she had a prolonged course of vancomycin [1 month] as well as cefepime for a month 02/24/2019 on evaluation today patient appears to be doing better than the last time I saw her. This is not a patient that I typically see. With that being said I am covering for Dr. Dellia Nims this week and again compared to when I last saw her overall the wounds in particular seem to be doing significantly better which is good news. With that being said the patient tells me several disconcerting things. She has not been able to get in to see anyone for potential debridement in regard to her leg wounds although she tells me that she does not think it is necessary any longer because she is taking care of that herself. She noticed a string coming out of the lower wound on her leg over the last week. The patient states that she subsequently decided that we must of pack something in there and started pulling the string out and as it kept coming and coming she realized this was likely her tendon. With that being said she continued to remove as much of this as she could. She then I subsequently proceeded to using tubes of antibiotic ointment which she will stick down into the wound and then scored as much as she can until she sees it coming out of the other wound opening. She states that in doing this she is actually made things better and there is less redness and irritation. With regard to her foot wound she does have some necrotic tendon and tissue noted in one small corner but again the actual wound itself seems to be doing better with good granulation in general compared to my last evaluation. 12/7; continued improvement in the wound on the substantial part of the right medial foot. Still a necrotic area inferiorly that required debridement but the rest of this looks very healthy and is contracting. She has 2 wounds on the right lateral leg which were  her original drainage sites from her abscess but all of this looks a lot better as well. She has been using silver alginate after putting antibiotic biotic ointment in one wound and watching it come out the other. I have talked to her in some detail today. I had given her names of orthopedic surgeons at [facility] for second opinion on what to do about the right leg. I do not  think the patient never called them. She has not been able to get a hold of the orthopedic surgeon in [place] that she had put a lot of faith in as being somebody would give her an opinion that she would trust. I talked to her today and said even if I could get her in to another orthopedic surgeon about the leg which she accept an amputation and she said she would not therefore I am not going to press this issue for the moment 12/14; continued improvement in his substantial wound on the right medial foot. There is still a necrotic area inferiorly with tightly adherent necrotic debris which I have been working on debriding each time she is here. She does not have an orthopedic appointment. Since last time she was here I looked over her cultures which were essentially MRSA on the foot wound and gram-negative rods in the abscess on the anterior leg. 12/21; continued improvement in the area on the right medial foot. She is not up on this much and that is probably a good thing since I do not know it could support continuous ambulation. She has a small area on the right lateral leg which were remanence of the IandD's I did because of the abscess. I think she should probably have prophylactic antibiotics I am going to have to look this over to see if we can make an intelligent decision here. In the meantime her major wound is come down nicely. Necrotic area inferiorly is still there but looks a lot better 04/06/2019; she has had some improvement in the overall surface area on the right medial foot somewhat narrowedr both but somewhat  longer. The areas on the right lateral leg which were initial IandD sites are superficial. Nothing is present on the right heel. We are using silver alginate to the wound areas 1/18; right medial foot somewhat smaller. Still a deep probing area in the most distal recess of the wound. She has nothing open on the right leg. She has a new wound on the plantar aspect of her left fourth toe which may have come from just pulling skin. The patient using Medihoney on the wound on her foot under silver alginate. I cannot discourage her from this 2/1; 2-week follow-up using silver alginate on the right foot and her left fourth toe. The area on the right dorsal foot is contracted although there is still the deep area in the most distal part of the wound but still has some probing depth. No overt infection 2/15; 2-week follow-up. She continues to have improvement in the surface area on the dorsal right foot. Even the tunneling area from last time is almost closed. The area that was on the plantar part of her left fourth toe over the PIP is indeed closed 3/1; 2-week follow-up. Continued improvement in surface area. The original divot that we have been debriding inferiorly I think has full epithelialization although the epithelialization is gone down into the wound with probably 4 mm of depth. Even under intense illumination I am unable to see anything open here. The remanence of the wound in this area actually look quite healthy. We have been using silver alginate 3/15; 2-week follow-up. Unfortunately not as good today. She has a comma shaped wound on the dorsal foot however the upper part of this is larger. Under illumination debris on the surface She also tells [place] that she was on her right leg 2 times in the last couple of weeks mostly to reach up  for things above her head etc. She felt a sharp pain in the right leg which she thinks is somewhere from the ankle to the knee. The patient has neuropathy and is  really uncertain. She cannot feel her foot so she does not think it was coming from there 3/29; 2-week follow-up. Her wound measures smaller. Surface of the wound appears reasonable. She is using silver alginate with underlying Medihoney. She has home health. X-rays I did of her tib-fib last time were negative although it did show arterial calcification 4/12; 2-week follow-up. Her wound measures smaller in length. Using manuka honey with silver alginate on top. She has home health. 4/26; 2-week follow-up. Her wound is smaller but still very adherent debris under illumination requiring debridement she has been using manuka honey with silver alginate. 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 Been using Hydrofera Blue. She is on suppressive Bactrim but apparently she has been taking it twice daily 6/7; I have not seen this wound and about 6 weeks. Since then she was up in [place]. By her own admission she was walking on the foot because she did not have a wheelchair. The wound is not nearly as healthy looking as it was the last time I saw this. We ordered different things for her but she only uses Medihoney and silver alginate. As far as I know she is on suppressive trimethoprim sulfamethoxazole. She does not admit to any fever or chills. Her CBGs apparently are at baseline however she is saying that she feels some discomfort on the lateral part of her ankle I looked over her last inflammatory markers from the summer 2020 at which time she had a deeply necrotic infected wound in this area. On 11/10/2018 her sedimentation rate was 56 and C-reactive protein 9.9. This was 107 and 29 on 07/29/2018. 6/17; the patient had a necrotic wound the last time she was here on the right dorsal foot. After debridement I did a culture. This showed a very resistant ESBL Klebsiella as well as Enterococcus. Her x-ray  of the foot which was done because of warmth and some discomfort showed bone destruction within the carpal bones involving the navicular acute cuboid lateral middle cuneiforms but essentially unchanged from her prior study which was done on 10/29/2018. The findings were felt to represent chronic osteomyelitis. We did inflammatory markers on her. Her white count was 5.25 sedimentation rate 16 and C-reactive protein at 11.1. Notable for the fact that in August 2020 her CRP was 9.9 and sedimentation rate 56. I have looked at her x-rays. It is true that the bone destruction is very impressive however the patient came into this clinic for the wound on her right foot with pieces of bone literally falling out anteriorly with purulent material. I am not exactly sure I could have expected anything different. She has not been systemically unwell no fever chills or blood sugars have been reasonable. 6/28; she arrives with a right heel closed. The substantial area on the right anterior foot looks healthy. Much better looking surface. I think we can change to [facility] seems to help this previously. She is getting her antibiotics at dialysis she should be just about finished 7/9; changed to [facility] last week. Surface wound looks satisfactory not much change in surface area however. She is going to [place] state next week this is usually a difficult thing  for this patient follow-up will be for 2 weeks. 7/23; using Hydrofera Blue. She returns from her trip and the wound looks surprisingly good. Usually when this patient goes on trips she comes back with a lot of problems with the wound. She is saying that she sometimes feels an episodic "crunching" feeling on the lateral part of the foot. She is neuropathic and not feeling pain but wonders whether this could be a neuropathic dysesthesia. 11/13/19-Patient returns after 3 weeks, the wound itself is stable and patient states that there is nothing new going  on she is on some extra anxiety medications and is resisting the temptation to pick at the dry skin around the wound. 9/20; patient has not been here in over a month and I have not seen her in 2 months. The wound in terms of size I think is about the same. There is no exposed bone. She has a nonviable surface on this. She is supposed to be using [facility] however she is also been using some form of honey preparation as well as a silver-based dressings. I do not think she has any pattern to this. 10/4; 2-week follow-up. Patient has been using some form of spray which she says has honey and silver to purchase this online she has been covering it with gauze. In spite of this the wound actually looks quite good. The deeper divot distally appears to be close down. There is a rim of epithelialization. 10/18; 2-week follow-up. Patient has been using her Hydrofera Blue covered with her silver honey spray that she got online. 11/1; 2-week follow-up. She is using Hydrofera Blue with a silver honey spray. Wound bed is measuring smaller. She has noticed that her foot is warmer on the right. She is concerned about infection. For a long period of time I had her on prophylactic trimethoprim sulfamethoxazole DS 1 tablet daily. She is asking for this to be restarted. The patient is walking on this foot because of repairs that are being done in a home her but her room is on the second floor she has to go up and down stairs. I have cautioned against this however as usual she will do exactly what she wants to do 11/15; 2-week follow-up. She uses Hydrofera Blue with a silver/honey spray which I have never heard of. I think her wound looks about the same. Some epithelialization. No evidence that this is infected. I think she is walking on this more than we agreed on. She is going on extensive vacation over Thanksgiving 12/3; 2-week follow-up. She is using Hydrofera Blue however over Thanksgiving she ran out of this  and she is simply been using Medihoney. In spite of this her wound is smaller almost divided into 2 now. She traveled extensively over Thanksgiving and actually looks quite good in spite of this. Usually this is been a marker of problems for her 12/17; 2-week follow-up. She is using Hydrofera Blue. The wound is smaller. Debris on the surface of this is fibrinous. She is traveling to [place] over the holidays which never bodes well for her wounds. I think she is walking more on her feet then she is even willing to admit and she tells me she does walk 2/11; using Hydrofera Blue. Her wounds are contracting however she walks in the clinic with a history that she has not been able to eat she has had vomiting. She also has right eye problems. Her blood sugar was 567. She had blood work done at dialysis and  we called there to get her blood work although it still had not returned although they should have this by the end of the day we were told. She also stated that she was not sure what her blood sugar was as her glucose monitor was not working and not coming to tomorrow. She also for some reason does not think her insulin pump is working well. Her endocrinologist is Dr. Elayne Snare 06/03/2020 upon evaluation today patient actually appears to be doing excellent in regard to her wound. In fact she tells me it was not even bleeding until she picked a dry piece of skin off while we were getting ready to come in and see her today. Fortunately there is no signs of active infection which is great news and overall very pleased. She is going to be seeing her neurologist sometime shortly I believe it might even be on Monday. Nonetheless there can proceed with the work-up as far as anything else going on with her eye the fact that her right eyelid is drooping. 3/25; right medial dorsal foot. She has superficial areas here that appear to be fully epithelialized she is using Medihoney and silver alginate and  some combination. ooShe has a new area on the mid left foot at roughly the level of the fifth metatarsal base 4/84/8; right medial dorsal foot. Still superficial areas here. I cleaned up 1 of these today she has been using I think mostly Medihoney but I would like her to use silver alginate. The area on the mid part of her foot also looks improved on the left. Since she was last here she tells me that she noted pus in the area of her left foot. She told dialysis about this they did a culture and ultimately she is on IV antibiotics but were not sure which one. They referred her to [name] he said that he did not need to follow her. I have not really verified any of this and I do not know what antibiotic she is on at dialysis 4/29; patient has been using Medihoney to her wounds. She reports taking off the toenail to her left second toe. She states that home health discharged her and she has not had help with dressing changes. She denies any signs and symptoms of infection. 5/13; 2-week follow-up. Miraculously the wound on the right foot dorsally is healed. She has an area on the tip of her left third toe and an area in the left midfoot. 5/26; patient presents for 2-week follow-up. She has 2 open wounds 1 to her plantar foot and the other to the left third toe. She has noticed increased swelling and redness to the toe wound. It is unclear exactly what she is doing for dressing changes as she has multiple dressing options at home. She states that it is easiest to do [facility] on the plantar wound and Prisma or Medihoney on the toe wound. 6/3; saw Dr. Maryjane Hurter week. She put her on Keflex because of the swelling I think of the left second toe. She arrives today with new breakdown x3 on the dorsal right foot which is disappointing. She also complains of pain in the right ankle, 3 liquid stools per day, chills without fever 6/17; patient was in [place]. I think a little more ambulatory than I would  like to hear. In any case she still has the 3 small open areas on the dorsal part of her foot on the right medial leg these look about the  same as last time. She is supposed to be using Prisma but I think used Medihoney. She has an area on the left lateral plantar foot which had undermining under her skin and a blood blister with the 2 areas communicating today. And finally an area on the tip of her left second toe 6/30; patient has 3 small areas that look better than the last time on the right dorsal foot. She uses a combination of her products including collagen, Hydrofera Blue and the Medihoney nevertheless they look better. The area on the left midfoot might have been healed although she pulled some skin off superficial open area. The left second toe still does not look that healthy. Open areas on the tips of the 7/21; the areas on her right dorsal foot are still present although they look benign. We are using silver collagen although she may be using any of the products she had at home. The real problem here is the left second toe. Necrotic wound over the distal phalanx and distal interphalangeal joint. This probes directly to bone. She arrives in clinic today with an interesting story. She went to [place] last week. She became ill there with refractory nausea and vomiting. She went to an urgent care and then the ER. I am able to see parts of this in Care Everywhere. She had 2 blood cultures done both of which ultimately showed MRSA. This was sensitive to daptomycin and linezolid and vancomycin. Her white count was 4.8 hemoglobin 10.4. X-ray of the left foot showed prior resection of the left fifth metatarsal no evidence of osteomyelitis. Her C-reactive protein on 10/13/2020 was 104.8. She refused admission to the hospital as she was to board her flight to return to [place]. She dialyzed this morning I am not certain why she did not bring this to their  attention Objective Constitutional Patient is hypertensive.. Pulse regular and within target range for patient.Marland Kitchen Respirations regular, non-labored and within target range.. Temperature is normal and within the target range for the patient.. She does not look to be systemically unwell. Vitals Time Taken: 1:06 PM, Height: 67 in, Weight: 125 lbs, BMI: 19.6, Temperature: 98.9 F, Pulse: 94 bpm, Respiratory Rate: 16 breaths/min, Blood Pressure: 162/79 mmHg, Capillary Blood Glucose: 173 mg/dl. General Notes: glucose per pt report General Notes: Wound exam oo Her left plantar foot is healed. The real problem is on the distal left second toe. Necrotic wound that probes to bone. I think there is cellulitis spreading into her left forefoot which I have marked. oo The areas on her right dorsal foot look about the same perhaps some better. Nothing looks ominous here. Integumentary (Hair, Skin) Wound #50 status is Healed - Epithelialized. Original cause of wound was Skin T ear/Laceration. The date acquired was: 06/24/2020. The wound has been in treatment 16 weeks. The wound is located on the [place]. The wound measures 0cm length x 0cm width x 0cm depth; 0cm^2 area and 0cm^3 volume. There is no tunneling or undermining noted. There is a none present amount of drainage noted. The wound margin is well defined and not attached to the wound base. There is no granulation within the wound bed. There is no necrotic tissue within the wound bed. Wound #51 status is Open. Original cause of wound was Trauma. The date acquired was: 07/29/2020. The wound has been in treatment 11 weeks. The wound is located on the Left T Second. The wound measures 2.1cm length x 1.5cm width x 0.6cm depth; 2.474cm^2 area  and 1.484cm^3 volume. There is Fat Layer oe (Subcutaneous Tissue) exposed. There is no tunneling or undermining noted. There is a medium amount of serosanguineous drainage noted. The wound margin is distinct with  the outline attached to the wound base. There is small (1-33%) red, pink granulation within the wound bed. There is a medium (34-66%) amount of necrotic tissue within the wound bed including Adherent Slough. Wound #52 status is Open. Original cause of wound was Gradually Appeared. The date acquired was: 09/02/2020. The wound has been in treatment 6 weeks. The wound is located on the Right Foot. The wound measures 0.8cm length x 3.5cm width x 0.1cm depth; 2.199cm^2 area and 0.22cm^3 volume. There is Fat Layer (Subcutaneous Tissue) exposed. There is no tunneling or undermining noted. There is a medium amount of serous drainage noted. The wound margin is distinct with the outline attached to the wound base. There is large (67-100%) pink granulation within the wound bed. There is a small (1-33%) amount of necrotic tissue within the wound bed including Adherent Slough. 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 - ***Back next Wednesday 10/26/2020**** Dr. Doyne Keel up oral antibiotics and start Vancomycin at dialysis center. If fever, redness increases, odor, drainage, or pain to go Emergency Department. Bathing/ Shower/ Hygiene: May shower and wash wound with soap and water. Edema Control - Lymphedema / SCD / Other: Elevate legs to the level of the heart or above for 30 minutes daily and/or when sitting, a frequency of: - throughout the day Avoid standing for long periods of time. Moisturize legs daily. - with dressing changes Off-Loading: Open toe surgical shoe to: - to both feet Other: - minimal weight bearing right foot Laboratory ordered were: Areobic culture-specimen not specified - culture left  second toe wound. WOUND #51: - T Second Wound Laterality: Left oe Cleanser: Soap and Water Every Other Day/30 Days Discharge Instructions: May shower and wash wound with dial antibacterial soap and water prior to dressing change. Prim Dressing: KerraCel Ag Gelling Fiber Dressing, 4x5 in (silver alginate) Every Other Day/30 Days ary Discharge Instructions: Apply silver alginate to wound bed as instructed Secondary Dressing: Woven Gauze Sponge, Non-Sterile 4x4 in (DME) (Generic) Every Other Day/30 Days Discharge Instructions: Apply over primary dressing as directed. Secured With: Child psychotherapist, Sterile 2x75 (in/in) Every Other Day/30 Days Discharge Instructions: Secure with stretch gauze as directed. Secured With: Paper T ape, 1x10 (in/yd) Every Other Day/30 Days Discharge Instructions: Secure dressing with tape as directed. WOUND #52: - Foot Wound Laterality: Right Cleanser: Soap and Water Every Other Day/30 Days Discharge Instructions: May shower and wash wound with dial antibacterial soap and water prior to dressing change. Prim Dressing: Promogran Prisma Matrix, 4.34 (sq in) (silver collagen) Every Other Day/30 Days ary Discharge Instructions: Moisten collagen with saline or hydrogel Secondary Dressing: Woven Gauze Sponge, Non-Sterile 4x4 in (DME) (Generic) Every Other Day/30 Days Discharge Instructions: Apply over primary dressing as directed. Secured With: Elastic Bandage 4 inch (ACE bandage) Every Other Day/30 Days Discharge Instructions: Secure with ACE bandage as directed. Secured With: Child psychotherapist, Sterile 2x75 (in/in) Every Other Day/30 Days Discharge Instructions: Secure with stretch gauze as directed. Secured With: Paper T ape, 1x10 (in/yd) Every Other Day/30 Days Discharge  Instructions: Secure dressing with tape as directed. 1. The patient has refused to go to the ER and is already refused to be admitted to hospital in [place] 2. The  source of the MRSA sepsis is probably the left second toe and undoubtedly she has a soft tissue infection and here let alone osteomyelitis. Her shunt site in her left arm was not tender although I did not specifically look at it under the dressing. I did not do a cardiac examination. 3. I have put in a call to nephrology I would like to get her started on vancomycin however she does not dialyze again till Saturday. I am going to give her a prescription for linezolid 600 twice daily 4 tablets. 4. I have marked the area on her left dorsal foot and told her to go to the ER if she sees the erythema expands 5. Although it is easy to talk about amputation of small toes. Previously when I talked about amputating one of her toes she did not talk to me for 2 years. Fortunately one of my colleagues healed her with antibiotics and HBO. This was several years ago and may have been the left third toe 6. She has PAD on the left although I can feel her peripheral pulses I do not think this is the primary issue. She has had a stent placed previously at [facility] but again I do not think this is the issue Addendum I was able to speak with Shirlee Limerick who is one of the PAs at [facility]. She will speak to colleague who is more familiar with this patient. I did emphasize the need to get her on vancomycin perhaps for as long as a month. I did put in 4 tablets of linezolid. What they do in terms of investigating this I will leave up to them. Whether she needs a TEE I am not certain and I will leave that for their decision making. I would like them to look at her shunt however presumably they did this this morning at dialysis Electronic Signature(s) Signed: 10/20/2020 5:24:41 PM By: Linton Ham MD Entered By: Linton Ham on 10/20/2020 15:19:17 -------------------------------------------------------------------------------- SuperBill Details Patient Name: Date of Service: [place] [name] [name] 10/20/2020 Medical  Record Number: [id number] Patient Account Number: [id number] Date of Birth/Sex: Treating RN: Feb 23, 1971 (50 y.o. 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 M86.172 Other acute osteomyelitis, left ankle and foot Facility Procedures CPT4 Code: [id number] Description: 99214 - WOUND CARE VISIT-LEV 4 EST PT Modifier: Quantity: 1 Physician Procedures : CPT4 Code Description Modifier [id number] - WC PHYS LEVEL 5 - EST PT ICD-10 Diagnosis Description M86.172 Other acute osteomyelitis, left ankle and foot L97.529 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

## 2020-10-23 LAB — AEROBIC CULTURE W GRAM STAIN (SUPERFICIAL SPECIMEN): Gram Stain: NONE SEEN

## 2020-10-24 NOTE — Progress Notes (Signed)
MAILEE, KLAAS ([id number]) Visit Report for 10/20/2020 Arrival Information Details Patient Name: Date of Service: [name] 10/20/2020 12:30 PM Medical Record Number: [id number] Patient Account Number: [id number] Date of Birth/Sex: Treating RN: 1970-12-17 (50 y.o. Nancy Fetter Primary Care Verginia Toohey: [name], [name] Other Clinician: Referring Perline Awe: Treating Shalay Carder/Extender: [name], [name] Weeks in Treatment: 37 Visit Information History Since Last Visit Added or deleted any medications: No Patient Arrived: Wheel Chair Any new allergies or adverse reactions: No Arrival Time: 13:06 Had a fall or experienced change in No Accompanied By: alone activities of daily living that may affect Transfer Assistance: None risk of falls: Patient Identification Verified: Yes Signs or symptoms of abuse/neglect since last visito No Secondary Verification Process Completed: Yes Hospitalized since last visit: No Patient Requires Transmission-Based Precautions: No Implantable device outside of the clinic excluding No Patient Has Alerts: No cellular tissue based products placed in the center since last visit: Has Dressing in Place as Prescribed: Yes Pain Present Now: No Electronic Signature(s) Signed: 10/24/2020 4:44:07 PM By: [name] RN, BSN Entered By: [name] on 10/20/2020 13:12:06 -------------------------------------------------------------------------------- Clinic Level of Care Assessment Details Patient Name: Date of Service: [name] 10/20/2020 12:30 PM Medical Record Number: [id number] Patient Account Number: [id number] Date of Birth/Sex: Treating RN: [birth year] (50 y.o. [name], Meta.Reding Primary Care Jonda Alanis: [name], [name] Other Clinician: Referring Cleda Imel: Treating Nuno Brubacher/Extender: [name], [name] Weeks in Treatment: 111 Clinic Level of Care Assessment Items TOOL 4 Quantity  Score X- 1 0 Use when only an EandM is performed on FOLLOW-UP visit ASSESSMENTS - Nursing Assessment / Reassessment X- 1 10 Reassessment of Co-morbidities (includes updates in patient status) X- 1 5 Reassessment of Adherence to Treatment Plan ASSESSMENTS - Wound and Skin A ssessment / Reassessment []  - 0 Simple Wound Assessment / Reassessment - one wound X- 2 5 Complex Wound Assessment / Reassessment - multiple wounds X- 1 10 Dermatologic / Skin Assessment (not related to wound area) ASSESSMENTS - Focused Assessment X- 2 5 Circumferential Edema Measurements - multi extremities X- 1 10 Nutritional Assessment / Counseling / Intervention []  - 0 Lower Extremity Assessment (monofilament, tuning fork, pulses) []  - 0 Peripheral Arterial Disease Assessment (using hand held doppler) ASSESSMENTS - Ostomy and/or Continence Assessment and Care []  - 0 Incontinence Assessment and Management []  - 0 Ostomy Care Assessment and Management (repouching, etc.) PROCESS - Coordination of Care []  - 0 Simple Patient / Family Education for ongoing care X- 1 20 Complex (extensive) Patient / Family Education for ongoing care X- 1 10 Staff obtains Programmer, systems, Records, T Results / Process Orders est []  - 0 Staff telephones HHA, Nursing Homes / Clarify orders / etc []  - 0 Routine Transfer to another Facility (non-emergent condition) []  - 0 Routine Hospital Admission (non-emergent condition) []  - 0 New Admissions / Biomedical engineer / Ordering NPWT Apligraf, etc. , []  - 0 Emergency Hospital Admission (emergent condition) []  - 0 Simple Discharge Coordination X- 1 15 Complex (extensive) Discharge Coordination PROCESS - Special Needs []  - 0 Pediatric / Minor Patient Management []  - 0 Isolation Patient Management []  - 0 Hearing / Language / Visual special needs []  - 0 Assessment of Community assistance (transportation, D/C planning, etc.) []  - 0 Additional assistance / Altered  mentation []  - 0 Support Surface(s) Assessment (bed, cushion, seat, etc.) INTERVENTIONS - Wound Cleansing / Measurement []  - 0 Simple Wound Cleansing -  one wound X- 2 5 Complex Wound Cleansing - multiple wounds X- 1 5 Wound Imaging (photographs - any number of wounds) $RemoveBe'[]'aiysnMVQn$  - 0 Wound Tracing (instead of photographs) $RemoveBeforeD'[]'wOrceSHnuQVaDl$  - 0 Simple Wound Measurement - one wound X- 2 5 Complex Wound Measurement - multiple wounds INTERVENTIONS - Wound Dressings X - Small Wound Dressing one or multiple wounds 1 10 X- 1 15 Medium Wound Dressing one or multiple wounds $RemoveBeforeD'[]'wbduHRDuKpYqZG$  - 0 Large Wound Dressing one or multiple wounds $RemoveBeforeD'[]'uOiUNPOpmpvCmZ$  - 0 Application of Medications - topical $RemoveB'[]'eZARACiK$  - 0 Application of Medications - injection INTERVENTIONS - Miscellaneous $RemoveBeforeD'[]'eWkYwWfkXekqym$  - 0 External ear exam $Remove'[]'yGYjJkw$  - 0 Specimen Collection (cultures, biopsies, blood, body fluids, etc.) $RemoveBefor'[]'XPTFeiiXAoTx$  - 0 Specimen(s) / Culture(s) sent or taken to Lab for analysis $RemoveBefo'[]'vEzGUNptqci$  - 0 Patient Transfer (multiple staff / Civil Service fast streamer / Similar devices) $RemoveBeforeDE'[]'idSSjDpSCyYhkiZ$  - 0 Simple Staple / Suture removal (25 or less) $Remove'[]'eOyFPgh$  - 0 Complex Staple / Suture removal (26 or more) $Remove'[]'AdnAiME$  - 0 Hypo / Hyperglycemic Management (close monitor of Blood Glucose) $RemoveBefore'[]'kbYgmEOGjtHxe$  - 0 Ankle / Brachial Index (ABI) - do not check if billed separately X- 1 5 Vital Signs Has the patient been seen at the hospital within the last three years: Yes Total Score: 155 Level Of Care: New/Established - Level 4 Electronic Signature(s) Signed: 10/20/2020 6:31:47 PM By: Deon Pilling Entered By: Deon Pilling on 10/20/2020 15:39:07 -------------------------------------------------------------------------------- Encounter Discharge Information Details Patient Name: Date of Service: [place] LLO [place] [name] 10/20/2020 12:30 PM Medical Record Number: [id number] Patient Account Number: [id number] Date of Birth/Sex: Treating RN: 1971-01-10 (50 y.o. Nancy Fetter Primary Care Winefred Hillesheim: [name], [name] Other Clinician: Referring  Joanell Cressler: Treating Donis Pinder/Extender: [name], [name] Weeks in Treatment: 111 Encounter Discharge Information Items Discharge Condition: Stable Ambulatory Status: Wheelchair Discharge Destination: Home Transportation: Private Auto Accompanied By: alone Schedule Follow-up Appointment: Yes Clinical Summary of Care: Patient Declined Electronic Signature(s) Signed: 10/24/2020 4:44:07 PM By: [name] RN, BSN Entered By: [name] on 10/20/2020 14:35:53 -------------------------------------------------------------------------------- Lower Extremity Assessment Details Patient Name: Date of Service: [name] 10/20/2020 12:30 PM Medical Record Number: [id number] Patient Account Number: [id number] Date of Birth/Sex: Treating RN: 11/15/1970 (50 y.o. Nancy Fetter Primary Care Lydia Meng: [name], [name] Other Clinician: Referring Ediberto Sens: Treating Jannat Rosemeyer/Extender: [name], [name] Weeks in Treatment: 111 Edema Assessment Assessed: [Left: No] [Right: No] Edema: [Left: No] [Right: No] Calf Left: Right: Point of Measurement: 30 cm From Medial Instep 29 cm 28 cm Ankle Left: Right: Point of Measurement: 9 cm From Medial Instep 21 cm 19.5 cm Vascular Assessment Pulses: Dorsalis Pedis Palpable: [Left:Yes] [Right:Yes] Electronic Signature(s) Signed: 10/24/2020 4:44:07 PM By: [name] RN, BSN Entered By: [name] on 10/20/2020 13:13:09 -------------------------------------------------------------------------------- Multi Wound Chart Details Patient Name: Date of Service: [name] 10/20/2020 12:30 PM Medical Record Number: [id number] Patient Account Number: [id number] Date of Birth/Sex: Treating RN: January 27, 1971 (50 y.o. [name] Primary Care Shaya Altamura: [name], [name] Other Clinician: Referring Javani Spratt: Treating Wessie Shanks/Extender: [name], [name] Weeks in Treatment:  111 Vital Signs Height(in): 67 Capillary Blood Glucose(mg/dl): 173 Weight(lbs): 125 Pulse(bpm): 94 Body Mass Index(BMI): 20 Blood Pressure(mmHg): 162/79 Temperature(F): 98.9 Respiratory Rate(breaths/min): 16 Photos: [50:No Photos Left, Plantar Foot] [51:No Photos Left T Second oe] [52:No Photos Right Foot] Wound Location: [50:Skin T ear/Laceration] [51:Trauma] [52:Gradually Appeared] Wounding Event: [50:Diabetic Wound/Ulcer of the Lower] [51:Diabetic Wound/Ulcer of  the Lower] [52:Diabetic Wound/Ulcer of the Lower] Primary Etiology: [50:Extremity Cataracts, Chronic sinus] [51:Extremity Cataracts, Chronic sinus] [52:Extremity Cataracts, Chronic sinus] Comorbid History: [50:problems/congestion, Anemia, Sleep Apnea, Deep Vein Thrombosis, Hypertension, Peripheral Arterial Disease, Type I Diabetes, Osteoarthritis, Osteomyelitis, Neuropathy, Seizure Disorder 06/24/2020] [51:problems/congestion, Anemia,  Sleep problems/congestion, Anemia, Sleep Apnea, Deep Vein Thrombosis, Hypertension, Peripheral Arterial Disease, Type I Diabetes, Osteoarthritis, Osteomyelitis, Neuropathy, Seizure Disorder 07/29/2020] [52:Apnea, Deep Vein Thrombosis, Hypertension,  Peripheral Arterial Disease, Type I Diabetes, Osteoarthritis, Osteomyelitis, Neuropathy, Seizure Disorder 09/02/2020] Date Acquired: [50:16] [51:11] [52:6] Weeks of Treatment: [50:Healed - Epithelialized] [51:Open] [52:Open] Wound Status: [50:No] [51:No] [52:Yes] Clustered Wound: [50:N/A] [51:N/A] [52:2] Clustered Quantity: [50:0x0x0] [51:2.1x1.5x0.6] [52:0.8x3.5x0.1] Measurements L x W x D (cm) [50:0] [51:2.474] [52:2.199] A (cm) : rea [50:0] [51:1.484] [52:0.22] Volume (cm) : [50:100.00%] [51:-110.00%] [52:29.30%] % Reduction in A rea: [50:100.00%] [51:-320.40%] [52:64.60%] % Reduction in Volume: [50:Grade 1] [51:Grade 2] [52:Grade 2] Classification: [50:None Present] [51:Medium] [52:Medium] Exudate A mount: [50:N/A] [51:Serosanguineous]  [52:Serous] Exudate Type: [50:N/A] [51:red, brown] [52:amber] Exudate Color: [50:Well defined, not attached] [51:Distinct, outline attached] [52:Distinct, outline attached] Wound Margin: [50:None Present (0%)] [51:Small (1-33%)] [52:Large (67-100%)] Granulation A mount: [50:N/A] [51:Red, Pink] [52:Pink] Granulation Quality: [50:None Present (0%)] [51:Medium (34-66%)] [52:Small (1-33%)] Necrotic A mount: [50:Fascia: No] [51:Fat Layer (Subcutaneous Tissue): Yes Fat Layer (Subcutaneous Tissue): Yes] Exposed Structures: [50:Fat Layer (Subcutaneous Tissue): No Tendon: No Muscle: No Joint: No Bone: No Large (67-100%)] [51:Fascia: No Tendon: No Muscle: No Joint: No Bone: No None] [52:Fascia: No Tendon: No Muscle: No Joint: No Bone: No Small (1-33%)] Treatment Notes Electronic Signature(s) Signed: 10/20/2020 5:24:41 PM By: Linton Ham MD Signed: 10/20/2020 6:31:47 PM By: Deon Pilling Entered By: Linton Ham on 10/20/2020 14:02:43 -------------------------------------------------------------------------------- Multi-Disciplinary Care Plan Details Patient Name: Date of Service: [place] LLO [address] [name] 10/20/2020 12:30 PM Medical Record Number: [id number] Patient Account Number: [id number] Date of Birth/Sex: Treating RN: 29-Jul-1970 (50 y.o. [name] Primary Care Nobel Brar: [name], [name] Other Clinician: Referring Doyce Stonehouse: Treating Emrys Mckamie/Extender: [name], [name] Weeks in Treatment: [place] reviewed with physician Active Inactive Nutrition Nursing Diagnoses: Imbalanced nutrition Impaired glucose control: actual or potential Potential for alteratiion in Nutrition/Potential for imbalanced nutrition Goals: Patient/caregiver agrees to and verbalizes understanding of need to use nutritional supplements and/or vitamins as prescribed Date Initiated: 09/03/2018 Date Inactivated: 10/07/2018 Target Resolution Date: 10/01/2018 Goal Status:  Met Patient/caregiver will maintain therapeutic glucose control Date Initiated: 09/03/2018 Target Resolution Date: 10/28/2020 Goal Status: Active Interventions: Provide education on elevated blood sugars and impact on wound healing Treatment Activities: Patient referred to Primary Care Physician for further nutritional evaluation : 09/03/2018 Notes: 07/29/20: Glucose control ongoing, target date extended. Wound/Skin Impairment Nursing Diagnoses: Impaired tissue integrity Knowledge deficit related to ulceration/compromised skin integrity Goals: Patient/caregiver will verbalize understanding of skin care regimen Date Initiated: 09/03/2018 Target Resolution Date: 10/27/2020 Goal Status: Active Ulcer/skin breakdown will have a volume reduction of 30% by week 4 Date Initiated: 09/03/2018 Date Inactivated: 10/07/2018 Target Resolution Date: 10/01/2018 Goal Status: Unmet Unmet Reason: COMORBITIES Ulcer/skin breakdown will have a volume reduction of 50% by week 8 Date Initiated: 10/07/2018 Date Inactivated: 11/03/2018 Target Resolution Date: 10/31/2018 Goal Status: Unmet Unmet Reason: Osteomyelitis Interventions: Assess patient/caregiver ability to obtain necessary supplies Assess patient/caregiver ability to perform ulcer/skin care regimen upon admission and as needed Assess ulceration(s) every visit Provide education on ulcer and skin care Treatment Activities: Skin care regimen initiated : 09/03/2018 Topical wound management initiated : 09/03/2018 Notes: Electronic Signature(s) Signed: 10/20/2020  6:31:47 PM By: Deon Pilling Entered By: Deon Pilling on 10/20/2020 13:08:08 -------------------------------------------------------------------------------- Pain Assessment Details Patient Name: Date of Service: Lorelee New [name] 10/20/2020 12:30 PM Medical Record Number: [id number] Patient Account Number: [id number] Date of Birth/Sex: Treating RN: 1971/01/27 (51 y.o. Nancy Fetter Primary  Care Aidian Salomon: [name], [name] Other Clinician: Referring Jernie Schutt: Treating Naomia Lenderman/Extender: [name], [name] Weeks in Treatment: 111 Active Problems Location of Pain Severity and Description of Pain Patient Has Paino No Site Locations Pain Management and Medication Current Pain Management: Electronic Signature(s) Signed: 10/24/2020 4:44:07 PM By: [name] RN, BSN Entered By: [name] on 10/20/2020 13:12:49 -------------------------------------------------------------------------------- Patient/Caregiver Education Details Patient Name: Date of Service: [name] 7/21/2022andnbsp12:30 PM Medical Record Number: [id number] Patient Account Number: [id number] Date of Birth/Gender: Treating RN: 1971-01-27 (49 y.o. [name] Primary Care Physician: [name], [name] Other Clinician: Referring Physician: Treating Physician/Extender: [name], [name] Weeks in Treatment: 111 Education Assessment Education Provided To: Patient Education Topics Provided Elevated Blood Sugar/ Impact on Healing: Handouts: Elevated Blood Sugars: How Do They Affect Wound Healing Methods: Explain/Verbal Responses: Reinforcements needed Electronic Signature(s) Signed: 10/20/2020 6:31:47 PM By: Deon Pilling Entered By: Deon Pilling on 10/20/2020 13:17:11 -------------------------------------------------------------------------------- Wound Assessment Details Patient Name: Date of Service: Lorelee New [name] 10/20/2020 12:30 PM Medical Record Number: [id number] Patient Account Number: [id number] Date of Birth/Sex: Treating RN: [birth year] (50 y.o. Nancy Fetter Primary Care Dolce Sylvia: [name], [name] Other Clinician: Referring Louretta Tantillo: Treating Mikayla Chiusano/Extender: [name], [name] Weeks in Treatment: 111 Wound Status Wound Number: 8 Primary Diabetic Wound/Ulcer of the Lower Extremity Etiology: Wound  Location: Left, Plantar Foot Wound Healed - Epithelialized Wounding Event: Skin Tear/Laceration Status: Date Acquired: 06/24/2020 Comorbid Cataracts, Chronic sinus problems/congestion, Anemia, Sleep Weeks Of Treatment: 16 History: Apnea, Deep Vein Thrombosis, Hypertension, Peripheral Arterial Clustered Wound: No Disease, Type I Diabetes, Osteoarthritis, Osteomyelitis, Neuropathy, Seizure Disorder Photos Photo Uploaded By: Donavan Burnet on 10/21/2020 08:56:35 Wound Measurements Length: (cm) Width: (cm) Depth: (cm) Area: (cm) Volume: (cm) 0 % Reduction in Area: 100% 0 % Reduction in Volume: 100% 0 Epithelialization: Large (67-100%) 0 Tunneling: No 0 Undermining: No Wound Description Classification: Grade 1 Wound Margin: Well defined, not attached Exudate Amount: None Present Foul Odor After Cleansing: No Slough/Fibrino No Wound Bed Granulation Amount: None Present (0%) Exposed Structure Necrotic Amount: None Present (0%) Fascia Exposed: No Fat Layer (Subcutaneous Tissue) Exposed: No Tendon Exposed: No Muscle Exposed: No Joint Exposed: No Bone Exposed: No Electronic Signature(s) Signed: 10/24/2020 4:44:07 PM By: [name] RN, BSN Entered By: [name] on 10/20/2020 13:15:11 -------------------------------------------------------------------------------- Wound Assessment Details Patient Name: Date of Service: [name] 10/20/2020 12:30 PM Medical Record Number: [id number] Patient Account Number: [id number] Date of Birth/Sex: Treating RN: Oct 18, 1970 (50 y.o. Nancy Fetter Primary Care Anyra Kaufman: [name], [name] Other Clinician: Referring Oliviana Mcgahee: Treating Wenzel Backlund/Extender: [name], [name] Weeks in Treatment: 111 Wound Status Wound Number: 65 Primary Diabetic Wound/Ulcer of the Lower Extremity Etiology: Wound Location: Left T Second oe Wound Open Wounding Event: Trauma Status: Date Acquired: 07/29/2020 Comorbid  Cataracts, Chronic sinus problems/congestion, Anemia, Sleep Weeks Of Treatment: 11 History: Apnea, Deep Vein Thrombosis, Hypertension, Peripheral Arterial Clustered Wound: No Disease, Type I Diabetes, Osteoarthritis, Osteomyelitis, Neuropathy, Seizure Disorder Photos Photo Uploaded By: Donavan Burnet on 10/21/2020 08:55:48 Wound Measurements Length: (cm) 2.1 Width: (cm) 1.5  Depth: (cm) 0.6 Area: (cm) 2.474 Volume: (cm) 1.484 % Reduction in Area: -110% % Reduction in Volume: -320.4% Epithelialization: None Tunneling: No Undermining: No Wound Description Classification: Grade 2 Wound Margin: Distinct, outline attached Exudate Amount: Medium Exudate Type: Serosanguineous Exudate Color: red, brown Foul Odor After Cleansing: No Slough/Fibrino Yes Wound Bed Granulation Amount: Small (1-33%) Exposed Structure Granulation Quality: Red, Pink Fascia Exposed: No Necrotic Amount: Medium (34-66%) Fat Layer (Subcutaneous Tissue) Exposed: Yes Necrotic Quality: Adherent Slough Tendon Exposed: No Muscle Exposed: No Joint Exposed: No Bone Exposed: No Treatment Notes Wound #51 (Toe Second) Wound Laterality: Left Cleanser Soap and Water Discharge Instruction: May shower and wash wound with dial antibacterial soap and water prior to dressing change. Peri-Wound Care Topical Primary Dressing KerraCel Ag Gelling Fiber Dressing, 4x5 in (silver alginate) Discharge Instruction: Apply silver alginate to wound bed as instructed Secondary Dressing Woven Gauze Sponge, Non-Sterile 4x4 in Discharge Instruction: Apply over primary dressing as directed. Secured With Conforming Stretch Gauze Bandage, Sterile 2x75 (in/in) Discharge Instruction: Secure with stretch gauze as directed. Paper Tape, 1x10 (in/yd) Discharge Instruction: Secure dressing with tape as directed. Compression Wrap Compression Stockings Add-Ons Electronic Signature(s) Signed: 10/24/2020 4:44:07 PM By: [name] RN,  BSN Entered By: [name] on 10/20/2020 13:15:33 -------------------------------------------------------------------------------- Wound Assessment Details Patient Name: Date of Service: [name] 10/20/2020 12:30 PM Medical Record Number: [id number] Patient Account Number: [id number] Date of Birth/Sex: Treating RN: [birth year] (50 y.o. Nancy Fetter Primary Care Lachlyn Vanderstelt: [name], [name] Other Clinician: Referring Tydus Sanmiguel: Treating Bitha Fauteux/Extender: [name], [name] Weeks in Treatment: 111 Wound Status Wound Number: 52 Primary Diabetic Wound/Ulcer of the Lower Extremity Etiology: Wound Location: Right Foot Wound Open Wounding Event: Gradually Appeared Status: Date Acquired: 09/02/2020 Comorbid Cataracts, Chronic sinus problems/congestion, Anemia, Sleep Weeks Of Treatment: 6 History: Apnea, Deep Vein Thrombosis, Hypertension, Peripheral Arterial Clustered Wound: Yes Disease, Type I Diabetes, Osteoarthritis, Osteomyelitis, Neuropathy, Seizure Disorder Photos Photo Uploaded By: Donavan Burnet on 10/21/2020 08:55:49 Wound Measurements Length: (cm) 0.8 Width: (cm) 3.5 Depth: (cm) 0.1 Clustered Quantity: 2 Area: (cm) 2.199 Volume: (cm) 0.22 % Reduction in Area: 29.3% % Reduction in Volume: 64.6% Epithelialization: Small (1-33%) Tunneling: No Undermining: No Wound Description Classification: Grade 2 Wound Margin: Distinct, outline attached Exudate Amount: Medium Exudate Type: Serous Exudate Color: amber Foul Odor After Cleansing: No Slough/Fibrino Yes Wound Bed Granulation Amount: Large (67-100%) Exposed Structure Granulation Quality: Pink Fascia Exposed: No Necrotic Amount: Small (1-33%) Fat Layer (Subcutaneous Tissue) Exposed: Yes Necrotic Quality: Adherent Slough Tendon Exposed: No Muscle Exposed: No Joint Exposed: No Bone Exposed: No Treatment Notes Wound #52 (Foot) Wound Laterality: Right Cleanser Soap and  Water Discharge Instruction: May shower and wash wound with dial antibacterial soap and water prior to dressing change. Peri-Wound Care Topical Primary Dressing Promogran Prisma Matrix, 4.34 (sq in) (silver collagen) Discharge Instruction: Moisten collagen with saline or hydrogel Secondary Dressing Woven Gauze Sponge, Non-Sterile 4x4 in Discharge Instruction: Apply over primary dressing as directed. Secured With Elastic Bandage 4 inch (ACE bandage) Discharge Instruction: Secure with ACE bandage as directed. Conforming Stretch Gauze Bandage, Sterile 2x75 (in/in) Discharge Instruction: Secure with stretch gauze as directed. Paper Tape, 1x10 (in/yd) Discharge Instruction: Secure dressing with tape as directed. Compression Wrap Compression Stockings Add-Ons Electronic Signature(s) Signed: 10/24/2020 4:44:07 PM By: [name] RN, BSN Entered By: [name] on 10/20/2020 13:16:01 -------------------------------------------------------------------------------- Vitals Details Patient Name: Date of Service: [place] LLO [place] Y, [name] 10/20/2020 12:30 PM Medical Record  Number: [id number] Patient Account Number: [id number] Date of Birth/Sex: Treating RN: [birth year] (50 y.o. Nancy Fetter Primary Care Triva Hueber: Other Clinician: [name], [name] Referring Celia Gibbons: Treating Jason Frisbee/Extender: [name], [name] Weeks in Treatment: 111 Vital Signs Time Taken: 13:06 Temperature (F): 98.9 Height (in): 67 Pulse (bpm): 94 Weight (lbs): 125 Respiratory Rate (breaths/min): 16 Body Mass Index (BMI): 19.6 Blood Pressure (mmHg): 162/79 Capillary Blood Glucose (mg/dl): 173 Reference Range: 80 - 120 mg / dl Notes glucose per pt report Electronic Signature(s) Signed: 10/24/2020 4:44:07 PM By: [name] RN, BSN Entered By: [name] on 10/20/2020 13:12:43

## 2020-10-26 ENCOUNTER — Other Ambulatory Visit: Payer: Self-pay

## 2020-10-26 ENCOUNTER — Encounter (HOSPITAL_BASED_OUTPATIENT_CLINIC_OR_DEPARTMENT_OTHER): Payer: Medicare HMO | Admitting: Internal Medicine

## 2020-10-26 DIAGNOSIS — E10621 Type 1 diabetes mellitus with foot ulcer: Secondary | ICD-10-CM | POA: Diagnosis not present

## 2020-10-26 NOTE — Progress Notes (Signed)
ALIECE, HONOLD ([id number]) Visit Report for 10/26/2020 Arrival Information Details Patient Name: Date of Service: [place] LLO Peggye Fothergill 10/26/2020 1:45 PM Medical Record Number: [id number] Patient Account Number: [id number] Date of Birth/Sex: Treating RN: 02/21/1971 (50 y.o. Nancy Fetter Primary Care Naisha Wisdom: [name], [name] Other Clinician: Referring Nandini Bogdanski: Treating Elizeth Weinrich/Extender: [name], [name] Weeks in Treatment: 71 Visit Information History Since Last Visit Added or deleted any medications: No Patient Arrived: Wheel Chair Any new allergies or adverse reactions: No Arrival Time: 14:00 Had a fall or experienced change in No Accompanied By: alone activities of daily living that may affect Transfer Assistance: None risk of falls: Patient Identification Verified: Yes Signs or symptoms of abuse/neglect since last visito No Secondary Verification Process Completed: Yes Hospitalized since last visit: No Patient Requires Transmission-Based Precautions: No Implantable device outside of the clinic excluding No Patient Has Alerts: No cellular tissue based products placed in the center since last visit: Has Dressing in Place as Prescribed: Yes Pain Present Now: No Electronic Signature(s) Signed: 10/26/2020 5:49:27 PM By: [name] RN, BSN Entered By: [name] on 10/26/2020 14:00:53 -------------------------------------------------------------------------------- Encounter Discharge Information Details Patient Name: Date of Service: [place] LLO [address] [name] 10/26/2020 1:45 PM Medical Record Number: [id number] Patient Account Number: [id number] Date of Birth/Sex: Treating RN: [birth year] (50 y.o. Nancy Fetter Primary Care Phu Record: [name], [name] Other Clinician: Referring Evangelene Vora: Treating Shanyiah Conde/Extender: [name], [name] Weeks in Treatment: 112 Encounter Discharge Information Items Post Procedure  Vitals Discharge Condition: Stable Temperature (F): 98.3 Ambulatory Status: Wheelchair Pulse (bpm): 88 Discharge Destination: Home Respiratory Rate (breaths/min): 16 Transportation: Private Auto Blood Pressure (mmHg): 194/88 Accompanied By: alone Schedule Follow-up Appointment: Yes Clinical Summary of Care: Patient Declined Electronic Signature(s) Signed: 10/26/2020 5:49:27 PM By: [name] RN, BSN Entered By: [name] on 10/26/2020 17:27:45 -------------------------------------------------------------------------------- Lower Extremity Assessment Details Patient Name: Date of Service: [name] 10/26/2020 1:45 PM Medical Record Number: [id number] Patient Account Number: [id number] Date of Birth/Sex: Treating RN: Aug 27, 1970 (50 y.o. Nancy Fetter Primary Care Zamaya Rapaport: [name], [name] Other Clinician: Referring Cyd Hostler: Treating Jakyra Kenealy/Extender: [name], [name] Weeks in Treatment: 112 Edema Assessment Assessed: [Left: No] [Right: No] Edema: [Left: No] [Right: No] Calf Left: Right: Point of Measurement: 30 cm From Medial Instep 29 cm 28 cm Ankle Left: Right: Point of Measurement: 9 cm From Medial Instep 21 cm 19.5 cm Vascular Assessment Pulses: Dorsalis Pedis Palpable: [Left:Yes] [Right:Yes] Electronic Signature(s) Signed: 10/26/2020 5:49:27 PM By: [name] RN, BSN Entered By: [name] on 10/26/2020 14:01:25 -------------------------------------------------------------------------------- Multi Wound Chart Details Patient Name: Date of Service: [name] 10/26/2020 1:45 PM Medical Record Number: [id number] Patient Account Number: [id number] Date of Birth/Sex: Treating RN: 09/23/1970 (50 y.o. Nancy Fetter Primary Care Christmas Faraci: [name], [name] Other Clinician: Referring Keilani Terrance: Treating Bahja Bence/Extender: [name], [name] Weeks in Treatment: 112 Vital  Signs Height(in): 20 Pulse(bpm): 45 Weight(lbs): 125 Blood Pressure(mmHg): 194/98 Body Mass Index(BMI): 20 Temperature(F): 98.3 Respiratory Rate(breaths/min): 16 Photos: [51:No Photos Left T Second oe] [52:No Photos Right Foot] [N/A:N/A N/A] Wound Location: [51:Trauma] [52:Gradually Appeared] [N/A:N/A] Wounding Event: [51:Diabetic Wound/Ulcer of the Lower] [52:Diabetic Wound/Ulcer of the Lower] [N/A:N/A] Primary Etiology: [51:Extremity Cataracts, Chronic sinus] [52:Extremity Cataracts, Chronic sinus] [N/A:N/A] Comorbid History: [51:problems/congestion, Anemia, Sleep Apnea, Deep Vein Thrombosis, Hypertension, Peripheral Arterial Disease, Type I Diabetes, Osteoarthritis, Osteomyelitis, Neuropathy,  Seizure Disorder 07/29/2020] [52:problems/congestion, Anemia,  Sleep Apnea, Deep Vein Thrombosis, Hypertension, Peripheral Arterial Disease, Type I Diabetes, Osteoarthritis, Osteomyelitis, Neuropathy, Seizure Disorder 09/02/2020] [N/A:N/A] Date Acquired: [51:12] [52:7] [N/A:N/A] Weeks of Treatment: [51:Open] [52:Open] [N/A:N/A] Wound Status: [51:No] [52:Yes] [N/A:N/A] Clustered Wound: [51:N/A] [52:2] [N/A:N/A] Clustered Quantity: [51:1.8x0.7x0.6] [52:0.6x2.9x0.1] [N/A:N/A] Measurements L x W x D (cm) [51:0.99] [52:1.367] [N/A:N/A] A (cm) : rea [51:0.594] [52:0.137] [N/A:N/A] Volume (cm) : [51:16.00%] [52:56.00%] [N/A:N/A] % Reduction in A [51:rea: -68.30%] [52:78.00%] [N/A:N/A] % Reduction in Volume: [51:Grade 2] [52:Grade 2] [N/A:N/A] Classification: [51:Medium] [52:Medium] [N/A:N/A] Exudate A mount: [51:Serosanguineous] [52:Serosanguineous] [N/A:N/A] Exudate Type: [51:red, brown] [52:red, brown] [N/A:N/A] Exudate Color: [51:Distinct, outline attached] [52:Distinct, outline attached] [N/A:N/A] Wound Margin: [51:Medium (34-66%)] [52:Medium (34-66%)] [N/A:N/A] Granulation A mount: [51:Red, Pink] [52:Pink] [N/A:N/A] Granulation Quality: [51:Medium (34-66%)] [52:Medium (34-66%)]  [N/A:N/A] Necrotic A mount: [51:Fat Layer (Subcutaneous Tissue): Yes Fat Layer (Subcutaneous Tissue): Yes N/A] Exposed Structures: [51:Bone: Yes Fascia: No Tendon: No Muscle: No Joint: No None] [52:Fascia: No Tendon: No Muscle: No Joint: No Bone: No Small (1-33%)] [N/A:N/A] Epithelialization: [51:N/A] [52:Chemical/Enzymatic/Mechanical] [N/A:N/A] Debridement: Pre-procedure Verification/Time Out N/A [52:14:25] [N/A:N/A] Taken: [51:N/A] [52:N/A] [N/A:N/A] Instrument: [51:N/A] [52:None] [N/A:N/A] Bleeding: [51:N/A] [52:0] [N/A:N/A] Procedural Pain: [51:N/A] [52:0] [N/A:N/A] Post Procedural Pain: [51:N/A] [52:Procedure was tolerated well] [N/A:N/A] Debridement Treatment Response: [51:N/A] [52:0.6x2.9x0.1] [N/A:N/A] Post Debridement Measurements L x W x D (cm) [51:N/A] [52:0.137] [N/A:N/A] Post Debridement Volume: (cm) [51:N/A] [52:Debridement] [N/A:N/A] Treatment Notes Electronic Signature(s) Signed: 10/26/2020 4:50:28 PM By: Linton Ham MD Signed: 10/26/2020 5:49:27 PM By: [name] RN, BSN Entered By: Linton Ham on 10/26/2020 14:35:57 -------------------------------------------------------------------------------- Multi-Disciplinary Care Plan Details Patient Name: Date of Service: [place] LLO [address] [name] 10/26/2020 1:45 PM Medical Record Number: [id number] Patient Account Number: [id number] Date of Birth/Sex: Treating RN: [birth year] (50 y.o. Nancy Fetter Primary Care Leverne Amrhein: [name], [name] Other Clinician: Referring Jaylanni Eltringham: Treating Isyss Espinal/Extender: [name], [name] Weeks in Treatment: [place] reviewed with physician Active Inactive Nutrition Nursing Diagnoses: Imbalanced nutrition Impaired glucose control: actual or potential Potential for alteratiion in Nutrition/Potential for imbalanced nutrition Goals: Patient/caregiver agrees to and verbalizes understanding of need to use nutritional supplements and/or  vitamins as prescribed Date Initiated: 09/03/2018 Date Inactivated: 10/07/2018 Target Resolution Date: 10/01/2018 Goal Status: Met Patient/caregiver will maintain therapeutic glucose control Date Initiated: 09/03/2018 Target Resolution Date: 11/25/2020 Goal Status: Active Interventions: Provide education on elevated blood sugars and impact on wound healing Treatment Activities: Patient referred to Primary Care Physician for further nutritional evaluation : 09/03/2018 Notes: 07/29/20: Glucose control ongoing, target date extended. Wound/Skin Impairment Nursing Diagnoses: Impaired tissue integrity Knowledge deficit related to ulceration/compromised skin integrity Goals: Patient/caregiver will verbalize understanding of skin care regimen Date Initiated: 09/03/2018 Target Resolution Date: 11/25/2020 Goal Status: Active Ulcer/skin breakdown will have a volume reduction of 30% by week 4 Date Initiated: 09/03/2018 Date Inactivated: 10/07/2018 Target Resolution Date: 10/01/2018 Goal Status: Unmet Unmet Reason: COMORBITIES Ulcer/skin breakdown will have a volume reduction of 50% by week 8 Date Initiated: 10/07/2018 Date Inactivated: 11/03/2018 Target Resolution Date: 10/31/2018 Goal Status: Unmet Unmet Reason: Osteomyelitis Interventions: Assess patient/caregiver ability to obtain necessary supplies Assess patient/caregiver ability to perform ulcer/skin care regimen upon admission and as needed Assess ulceration(s) every visit Provide education on ulcer and skin care Treatment Activities: Skin care regimen initiated : 09/03/2018 Topical wound management initiated : 09/03/2018 Notes: Electronic Signature(s) Signed: 10/26/2020 5:49:27 PM By: [name] RN, BSN Entered By: [name] on 10/26/2020 17:26:27 -------------------------------------------------------------------------------- Pain Assessment Details Patient Name: Date  of Service: [name] 10/26/2020 1:45 PM Medical Record  Number: [id number] Patient Account Number: [id number] Date of Birth/Sex: Treating RN: 1971/03/23 (50 y.o. Nancy Fetter Primary Care Tawnya Pujol: [name], [name] Other Clinician: Referring Alleene Stoy: Treating Kross Swallows/Extender: [name], [name] Weeks in Treatment: 112 Active Problems Location of Pain Severity and Description of Pain Patient Has Paino No Site Locations Pain Management and Medication Current Pain Management: Electronic Signature(s) Signed: 10/26/2020 5:49:27 PM By: [name] RN, BSN Entered By: [name] on 10/26/2020 14:01:18 -------------------------------------------------------------------------------- Patient/Caregiver Education Details Patient Name: Date of Service: [place] LLO [place], [place]. 7/27/2022andnbsp1:45 PM Medical Record Number: [id number] Patient Account Number: [id number] Date of Birth/Gender: Treating RN: 1970/09/27 (50 y.o. Nancy Fetter Primary Care Physician: [name], [name] Other Clinician: Referring Physician: Treating Physician/Extender: [name], [name] Weeks in Treatment: 51 Education Assessment Education Provided To: Patient Education Topics Provided Wound/Skin Impairment: Methods: Explain/Verbal Responses: State content correctly Electronic Signature(s) Signed: 10/26/2020 5:49:27 PM By: [name] RN, BSN Entered By: [name] on 10/26/2020 17:26:39 -------------------------------------------------------------------------------- Wound Assessment Details Patient Name: Date of Service: [name] 10/26/2020 1:45 PM Medical Record Number: [id number] Patient Account Number: [id number] Date of Birth/Sex: Treating RN: 06-04-1970 (50 y.o. Nancy Fetter Primary Care Inetha Maret: [name], [name] Other Clinician: Referring Alberto Pina: Treating Gerhardt Gleed/Extender: [name], [name] Weeks in Treatment: 112 Wound Status Wound Number: 51 Primary Diabetic  Wound/Ulcer of the Lower Extremity Etiology: Wound Location: Left T Second oe Wound Open Wounding Event: Trauma Status: Date Acquired: 07/29/2020 Comorbid Cataracts, Chronic sinus problems/congestion, Anemia, Sleep Weeks Of Treatment: 12 History: Apnea, Deep Vein Thrombosis, Hypertension, Peripheral Arterial Clustered Wound: No Disease, Type I Diabetes, Osteoarthritis, Osteomyelitis, Neuropathy, Seizure Disorder Photos Photo Uploaded By: Donavan Burnet on 10/26/2020 17:00:16 Wound Measurements Length: (cm) 1.8 Width: (cm) 0.7 Depth: (cm) 0.6 Area: (cm) 0.99 Volume: (cm) 0.594 % Reduction in Area: 16% % Reduction in Volume: -68.3% Epithelialization: None Tunneling: No Undermining: No Wound Description Classification: Grade 2 Wound Margin: Distinct, outline attached Exudate Amount: Medium Exudate Type: Serosanguineous Exudate Color: red, brown Foul Odor After Cleansing: No Slough/Fibrino Yes Wound Bed Granulation Amount: Medium (34-66%) Exposed Structure Granulation Quality: Red, Pink Fascia Exposed: No Necrotic Amount: Medium (34-66%) Fat Layer (Subcutaneous Tissue) Exposed: Yes Necrotic Quality: Adherent Slough Tendon Exposed: No Muscle Exposed: No Joint Exposed: No Bone Exposed: Yes Treatment Notes Wound #51 (Toe Second) Wound Laterality: Left Cleanser Soap and Water Discharge Instruction: May shower and wash wound with dial antibacterial soap and water prior to dressing change. Peri-Wound Care Topical Primary Dressing KerraCel Ag Gelling Fiber Dressing, 4x5 in (silver alginate) Discharge Instruction: Apply silver alginate to wound bed as instructed Secondary Dressing Woven Gauze Sponge, Non-Sterile 4x4 in Discharge Instruction: Apply over primary dressing as directed. Secured With Conforming Stretch Gauze Bandage, Sterile 2x75 (in/in) Discharge Instruction: Secure with stretch gauze as directed. Paper Tape, 1x10 (in/yd) Discharge Instruction: Secure  dressing with tape as directed. Compression Wrap Compression Stockings Add-Ons Electronic Signature(s) Signed: 10/26/2020 5:49:27 PM By: [name] RN, BSN Entered By: [name] on 10/26/2020 14:02:51 -------------------------------------------------------------------------------- Wound Assessment Details Patient Name: Date of Service: [name] 10/26/2020 1:45 PM Medical Record Number: [id number] Patient Account Number: [id number] Date of Birth/Sex: Treating RN: [birth year] (50 y.o. Nancy Fetter Primary Care Sabir Charters: [name], [name] Other Clinician: Referring Angelie Kram: Treating Carolan Avedisian/Extender: [name],  [name] Weeks in Treatment: 112 Wound Status Wound Number: 52 Primary Diabetic Wound/Ulcer of the Lower Extremity Etiology: Wound Location: Right Foot Wound Open Wounding Event: Gradually Appeared Status: Date Acquired: 09/02/2020 Comorbid Cataracts, Chronic sinus problems/congestion, Anemia, Sleep Weeks Of Treatment: 7 History: Apnea, Deep Vein Thrombosis, Hypertension, Peripheral Arterial Clustered Wound: Yes Disease, Type I Diabetes, Osteoarthritis, Osteomyelitis, Neuropathy, Seizure Disorder Photos Photo Uploaded By: Donavan Burnet on 10/26/2020 17:00:16 Wound Measurements Length: (cm) 0.6 Width: (cm) 2.9 Depth: (cm) 0.1 Clustered Quantity: 2 Area: (cm) 1.367 Volume: (cm) 0.137 % Reduction in Area: 56% % Reduction in Volume: 78% Epithelialization: Small (1-33%) Tunneling: No Undermining: No Wound Description Classification: Grade 2 Wound Margin: Distinct, outline attached Exudate Amount: Medium Exudate Type: Serosanguineous Exudate Color: red, brown Foul Odor After Cleansing: No Slough/Fibrino Yes Wound Bed Granulation Amount: Medium (34-66%) Exposed Structure Granulation Quality: Pink Fascia Exposed: No Necrotic Amount: Medium (34-66%) Fat Layer (Subcutaneous Tissue) Exposed: Yes Necrotic Quality:  Adherent Slough Tendon Exposed: No Muscle Exposed: No Joint Exposed: No Bone Exposed: No Treatment Notes Wound #52 (Foot) Wound Laterality: Right Cleanser Soap and Water Discharge Instruction: May shower and wash wound with dial antibacterial soap and water prior to dressing change. Peri-Wound Care Topical Primary Dressing PolyMem Non-Adhesive Dressing, 4x4 in Discharge Instruction: Apply to wound bed as instructed Secondary Dressing Woven Gauze Sponge, Non-Sterile 4x4 in Discharge Instruction: Apply over primary dressing as directed. Secured With Elastic Bandage 4 inch (ACE bandage) Discharge Instruction: Secure with ACE bandage as directed. Conforming Stretch Gauze Bandage, Sterile 2x75 (in/in) Discharge Instruction: Secure with stretch gauze as directed. Paper Tape, 1x10 (in/yd) Discharge Instruction: Secure dressing with tape as directed. Compression Wrap Compression Stockings Add-Ons Electronic Signature(s) Signed: 10/26/2020 5:49:27 PM By: [name] RN, BSN Entered By: [name] on 10/26/2020 14:03:14 -------------------------------------------------------------------------------- Vitals Details Patient Name: Date of Service: [name] 10/26/2020 1:45 PM Medical Record Number: [id number] Patient Account Number: [id number] Date of Birth/Sex: Treating RN: Aug 26, 1970 (50 y.o. Nancy Fetter Primary Care Derinda Bartus: [name], [name] Other Clinician: Referring Jedidiah Demartini: Treating Shelena Castelluccio/Extender: [name], [name] Weeks in Treatment: 112 Vital Signs Time Taken: 14:00 Temperature (F): 98.3 Height (in): 67 Pulse (bpm): 88 Weight (lbs): 125 Respiratory Rate (breaths/min): 16 Body Mass Index (BMI): 19.6 Blood Pressure (mmHg): 194/98 Reference Range: 80 - 120 mg / dl Electronic Signature(s) Signed: 10/26/2020 5:49:27 PM By: [name] RN, BSN Entered By: [name] on 10/26/2020 14:01:11

## 2020-10-26 NOTE — Progress Notes (Signed)
Nancy, [name] ([id number]) Visit Report for 10/26/2020 Debridement Details Patient Name: Date of Service: [name] 10/26/2020 1:45 PM Medical Record Number: [id number] Patient Account Number: [id number] Date of Birth/Sex: Treating RN: Nov 14, 1970 (50 y.o. [name] Primary Care Provider: [name], [name] Other Clinician: Referring Provider: Treating Provider/Extender: [name], [name] Weeks in Treatment: 112 Debridement Performed for Assessment: Wound #52 Right Foot Performed By: Clinician [name], RN Debridement Type: Chemical/Enzymatic/Mechanical Agent Used: Anasept and gauze to remove biofilm Severity of Tissue Pre Debridement: Fat layer exposed Level of Consciousness (Pre-procedure): Awake and Alert Pre-procedure Verification/Time Out Yes - 14:25 Taken: Start Time: 14:25 Bleeding: None Procedural Pain: 0 Post Procedural Pain: 0 Response to Treatment: Procedure was tolerated well Level of Consciousness (Post- Awake and Alert procedure): Post Debridement Measurements of Total Wound Length: (cm) 0.6 Width: (cm) 2.9 Depth: (cm) 0.1 Volume: (cm) 0.137 Character of Wound/Ulcer Post Debridement: Improved Severity of Tissue Post Debridement: Fat layer exposed Post Procedure Diagnosis Same as Pre-procedure Electronic Signature(s) Signed: 10/26/2020 4:50:28 PM By: Linton Ham MD Signed: 10/26/2020 5:49:27 PM By: [name] RN, BSN Entered By: [name] on 10/26/2020 14:25:35 -------------------------------------------------------------------------------- HPI Details Patient Name: Date of Service: [name] 10/26/2020 1:45 PM Medical Record Number: [id number] Patient Account Number: [id number] Date of Birth/Sex: Treating RN: Feb 14, 1971 (50 y.o. [name] Primary Care Provider: [name], [name] Other Clinician: Referring Provider: Treating Provider/Extender: [name], NIA  LL Weeks in Treatment: 15 History of Present Illness HPI Description: When46 year old diabetic who is known to have type 1 diabetes which is poorly controlled last hemoglobin A1c was 11%. She comes in with a ulcerated area on the left lateral foot which has been there for over 6 months. Was recently she has been treated by [name] of podiatry who saw her last on 05/28/2016. Review of his notes revealed that the patient had incision and drainage with placement of antibiotic beads to the left foot on 04/11/2016 for possible osteomyelitis of the cuboid bone. Over the last year she's had a history of amputation of the left fifth toe and a femoropopliteal popliteal bypass graft somewhere in April 2017. 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 His note [name] mentions that the patient has been referred to me for further wound care and possibly great candidate for hyperbaric oxygen therapy due to recurrent osteomyelitis. However we do not have any x-rays of biopsy reports confirming this. He has been on several antibiotics including Bactrim and most recently is on doxycycline for an MRSA. I understand, the patient was not a candidate for IV antibiotics as she has had previous PICC lines which resulted in blood clots in both arms. There was a x-ray report dated 04/04/2016 on [name] notes which showed evidence of fifth ray resection left foot with osteolytic changes noted to the fourth metatarsal and cuboid bone on the left. 06/13/2016 -- had a left foot x-ray which showed no acute fracture or dislocation and no definite radiographic evidence of osteomyelitis. Advanced osteopenia was seen. 06/20/2016 -- she has noticed a new wound on the right plantar foot in the region where she had a callus before. 06/27/16- the patient did have her x-ray of the right foot which showed no findings to suggest osteomyelitis. She saw her endocrinologist, [name], yesterday. Her A1c in January was  11. He also indicates mismanagement and noncompliance regarding her diabetes. She is currently  on Bactrim for a lip infection. She is complaining of nausea, vomiting and diarrhea. She is unable to articulate the exact orders or dosing of the Bactrim; it is unclear when she will complete this. 07/04/2016 -- results from [facility] of ABIs with ankle waveforms were noted from 02/14/2016. The examination done on 06/27/2015 showed noncompressible ABIs with the right being 1.45 and the left being 1.33. The present examination showed a right ABI of 1.19 on the left of 1.33. The conclusion was that right normal ABI in the lower extremity at rest however compared to previous study which was noncompressible ABI may be falsely elevated side suggesting medial calcification. The left ABI suggested medial calcification. 08/01/2016 -- the patient had more redness and pain on her right foot and did not get to come to see as noted she see her PCP or go to the ER and decided to take some leftover metronidazole which she had at home. As usual, the patient does report she feels and is rather noncompliant. 08/08/2016 -- -- x-ray of the right foot -- FINDINGS:Transmetatarsal amputation is noted. No bony destruction is noted to suggest osteomyelitis. IMPRESSION: No evidence of osteomyelitis. Postsurgical changes are seen. MRI would be more sensitive for possible bony changes. Culture has grown Serratia Marcescens -- sensitive to Bactrim, ciprofloxacin, ceftazidime she was seen by [name] on 08/06/2016. He did not find any exposed bone, muscle, tendon, ligament or joint. There was no malodor and he did a excisional debridement in the office. ============ Old notes: 50 year old patient who is known to the wound clinic for a while had been away from the wound clinic since 09/01/2014. Over the last several months she has been admitted to various hospitals including [facility] [place]. She was treated for a  right metatarsal osteomyelitis with a transmetatarsal amputation and this was done about 2 months ago. He has a small ulcerated area on the right heel and she continues to have an ulcerated area on the left plantar aspect of the foot. The patient was recently admitted to the [facility] hospital group between 7/12 and 10/18/2014. she was given 3 weeks of IV vancomycin and was to follow-up with her surgeons at [facility] and also took oral vancomycin for C. difficile colitis. 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 MRI of the left foot done on 10/14/2014 showed no abscess or osteomyelitis. 04/27/15; this is a patient we know from previous stays in the wound care center. She is a type I diabetic I am not sure of her control currently. Since the last time I saw her she is had a right transmetatarsal amputation and has no wounds on her right foot and has no open wounds. She is been followed at the wound care center at [facility] in [place]. She comes today with the desire to undergo hyperbaric treatment locally. Apparently one of her wound care providers in [place] has suggested hyperbarics. This is in response to an MRI from 04/18/15 that showed increased marrow signal and loss of the proximal fifth metatarsal cortex evidence of osteomyelitis with likely early osteomyelitis in the cuboid bone as well. She has a large wound over the base of the fifth metatarsal. She also has a eschar over her the tips of her toes on 1,3 and 5. She  does not have peripheral pulses and apparently is going for an angiogram tomorrow which seems reasonable. After this she is going to infectious disease at [facility]. They have been using  Medihoney to the large wound on the lateral aspect of the left foot to. The patient has known Charcot deformity from diabetic neuropathy. She also has known diabetic PAD. Surprisingly I can't see that she has had any recent antibiotics, the patient states the last antibiotic she had was at the end of November for 10 days. I think this was in response to culture that showed group G strep although I'm not exactly sure where the culture was from. She is also had arterial studies on 03/29/15. This showed a right ABI of 1.4 that was noncompressible. Her left ABI was 0.73. 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 It was not felt that arterial inflow was adequate for healing of a foot ulcer. Her Doppler waveforms looked monophasic ===== READMISSION 02/28/17; this is in an now 50 year old woman we've had at several different occasions in this clinic. 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 She has a remote ex-smoker. She was last seen in this clinic by Dr. Con Memos I think in May. More recently she is been followed by her podiatrist [name] an infectious disease Dr. Megan Salon. She has 2 open wounds the major one is over the right first metatarsal head she also has a wound on the left plantar foot. an MRI of the right foot on 01/01/17 showed a soft tissue ulcer along the plantar aspect of the first metatarsal base consistent with osteomyelitis of the first metatarsal stump. Dr. 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 According to the patient this is been open for slightly over a month. She has been on a combination of Cipro 500 twice a day, Zyvox 600 twice a day and Flagyl 500 3 times a day for over a month now as directed by Dr. Megan Salon. cultures of the right foot earlier this year showed MRSA in January and Serratia in May. January also had a few viridans strep. Recent  x-rays of both feet were done and [name] office and I don't have these reports. The patient has known PAD and has a history of aleft femoropopliteal bypass in April 2017. 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 Dwyane Dee. In spite of this the last hemoglobin A1c I can see is 10.1 on 01/01/2017. She is being referred by 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 She is been using Medihoney to this area and also an area on the left midfoot. She is using healing sandals bilaterally. ABIs in this clinic at the left posterior tibial was 1.1 noncompressible on the right READMISSION Non invasive vascular NOVANT 5/18 Aftercare following surgery of the circulatory system Procedure Note - Interface, External Ris In - 08/13/2016 11:05 AM EDT Procedure: Examination consists of physiologic resting arterial pressures of the brachial and ankle arteries bilaterally with continuous wave Doppler waveform analysis. Previous: Previous exam performed on 02/14/16 demonstrated ABIs of Rt = 1.19 and Lt = 1.33. Right: ABI = non-compressible PT 1.47 DP. S/P transmet amputation. , Left: ABI = 1.52, 2nd  digit pressure = 87 mmHg Conclusions: Right: ABI (>1.3) may be falsely elevated, suggesting medial calcification. Left: ABI (>1.3) may be falsely elevated, suggesting medial calcification The patient is a now 50 year old type I diabetic is had multiple issues her graded to chronic diabetic foot ulcers. 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 We had her in the clinic lastin November. At that point she had wounds on her bilateral feet.she had wanted to try hyperbarics however the healogics review  process denied her because she hadn't followed up with her vascular surgeon for her left femoropopliteal bypass. The bypass was done by Dr. Raul Del at [facility]. We made her a follow-up with Dr. Raul Del however she did not keep the appointment and therefore she was not approved The patient shows me a small wound on her left fourth metatarsal head on her phone. She developed rapid discoloration in the plantar aspect of the left foot and she was admitted to hospital from 2/2 through 05/10/17 with wet gangrene of the left foot osteomyelitis of the fourth metatarsal heads. She was admitted acutely ill with a temperature of 103. She was started on broad-spectrum vancomycin and cefepime. On 05/06/17 she was taken to the OR by [name] her podiatric surgeon for an incision and drainage irrigation of the left foot wound. Cultures from this surgery revealed group be strep and anaerobes. she was seen by Dr.Xu of orthopedic surgery and scheduled for a below-knee amputation which she u refused. Ultimately she was discharged on Levaquin and Flagyl for one month. MRI 05/05/17 done while she was in the hospital showed abscess adjacent to the fourth metatarsal head and neck small abscess around the fourth flexor tendon. Inflammatory phlegmon and gas in the soft tissues along the lateral aspect of the fourth phalanx. Findings worrisome for osteomyelitis involving the fourth proximal and middle phalanx and also the third and fourth metatarsals. Finally the patient had actually shortly before this followed up with Dr. Raul Del at no time on 04/29/17. He felt that her left femoropopliteal bypass was patent he felt that her left-sided toe pressures more than adequate for healing a wound on the left foot. This was before her acute presentation. 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 05/28/17; she is started hyperbarics. The patient tells me that for some reason she was not actually on Levaquin but I think on  ciprofloxacin. She was on Flagyl. She only started her Levaquin yesterday due to some difficulty with the pharmacy and perhaps her sister picking it up. She has an appointment with [name] tomorrow and with infectious disease early next week. She has no new complaints 06/06/17; the patient continues in hyperbarics. She saw [name] on 05/29/17 who is her podiatric surgeon. He is elected for a transmetatarsal amputation on 06/27/17. I'm not sure at what level he plans to do this amputation. The patient is unaware She also saw Dr. Megan Salon of infectious disease who elected to continue her on current antibiotics I think this is ciprofloxacin and Flagyl. I'll need to clarify with her tomorrow if she actually has this. We're using silver alginate to the actual wound. Necrotic surface today with material under the flap of her foot. Original MRI showed abscesses as well as osteomyelitis of the proximal and middle fourth phalanx and the third and fourth metatarsal heads 06/11/17; patient continues in hyperbarics and continues on oral antibiotics. She is doing well. The wound looks better. The necrotic part of this under the  flap in her superior foot also looks better. she is been to see [name]. I haven't had a chance to look at his note. Apparently he has put the transmetatarsal amputation on hold her request it is still planning to take her to the OR for debridement and product application ACEL. I'll see if I can find his note. I'll therefore leave product ordering/requests to [name] for now. I was going to look at Dermagraft 06/18/17-she is here in follow-up evaluation for bilateral foot wounds. She continues with hyperbaric therapy. She states she has been applying manuka honey to the right plantar foot and alternate manuka honey and silver alginate to the left foot, despite our orders. We will continue with same treatment plan and she will follo up next week. 06/25/17; I have reviewed [name] last  note from 3/11. She has operative debridement in 2 days' time. By review his note apparently they're going to place there is skin over the majority of this wound which is a good choice. She has a small satellite area at the most proximal part of this wound on the left plantar foot. The area on the right plantar foot we've been using silver alginate and it is close to healing. 07/02/17; unfortunately the patient was not easily approved for [name] proposed surgery. I'm not completely certain what the issue is. She has been using silver alginate to the wound she has completed a first course of hyperbarics. She is still on Levaquin and Flagyl. I have really lost track of the time course here.I suspect she should have another week to 2 of antibiotics. I'll need to see if she is followed up with infectious disease Dr. Megan Salon 07/09/17; the patient is followed up with Dr. Megan Salon. 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 She continues on Levaquin and metronidazole continuing both of these for now I think she is been on fr about 6 weeks. She still has some drainage but no pain. No fever. Her had been plans for her to go to the OR for operative debridement with her podiatrist [name], I am not exactly sure where that is. I'll probably slip a note to [name] today. I note that she follows with Dr. Dwyane Dee of endocrinology. We have her recertified for hyperbaric oxygen. I have not heard about Dermagraft however I'll see if [name] is planning a skin substitute as well 07/16/17; the patient tells me she is just about out of [place]. I'll need to check Dr. Hale Bogus last notes on this. She states she has plenty of Flagyl however. She comes in today complaining of pain in the right lateral foot which she said lasted for about a day. The wound on the right foot is actually much more medially. She also tells me that the [facility] cost a lot of pain in the left  foot wound and she turned back to silver alginate. Finally Dermagraft has a $782 per application co-pay. She cannot afford this 07/23/17; patient arrives today with the wound not much smaller. There is not much new to add. She has not heard from [name] all try to put in a call to them today. She was asking about Dermagraft again and she has an over $956 per application co-pay she states that she would be willing to try to do a payment plan. I been tried to avoid this. We've been using silver alginate, I'll change to [facility] 07/30/17-She is  here in follow-up evaluation for left foot ulcer. She continues hyperbaric medicine. The left foot ulcer is stable we will continue with same treatment plan 08/06/17; she is here for evaluation of her left foot ulcer. Currently being treated for hyperbarics or underlying osteomyelitis. She is completed antibiotics. The left foot ulcer is better smaller with healthier looking granulation. For various reasons I am not really clear on we never got her back to the OR with [name]. He did not respond to my secure text message. Nevertheless I think that surgery on this point is not necessary nor am I completely clear that a skin substitute is necessary The patient is complaining about pain on the outside of her right foot. 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 There is no erythema. She also states the foot is warm versus her other part of her upper leg and this is largely true. It is not totally clear to me what's causing this. She thinks it's different from her usual neuropathy pain 08/13/17; she arrives in clinic today with a small wound which is superficial on her right first metatarsal head. 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 She tells [place] she was up on her feet over the Mother's Day celebration. The large wound is on the left foot. Continues with hyperbarics for underlying osteomyelitis. We're using Hydrofera Blue.  She asked me today about where we were with Dermagraft. I had actually excluded this because of the co-pay however she wants to assume this therefore I'll recheck the co-pay an order for next week. 08/20/17; the patient agreed to accept the co-pay of the first Apligraf which we applied today. She is disappointed she is finishing hyperbarics will run this through the insurance on the extent of the foot infection and the extent of the wound that she had however she is already had 60 dive's. Dermagraft No. 1 08/27/17; Dermagraft No. 2. She is not eligible for any more hyperbaric treatments this month. She reports a fair amount of drainage and she actually changed to the external dressings without disturbing the direct contact layer 09/03/17; the patient arrived in clinic today with the wound superficially looking quite healthy. Nice vibrant red tissue with some advancing epithelialization although not as much adherence of the flap as I might like. However she noted on her own fourth toe some bogginess and she brought that to our attention. Indeed this was boggy feeling like a possibility of subcutaneous fluid. 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 She is not been unwell. We've been using Dermagraft 09/10/17; the culture that I did not last week was MRSA. She saw Dr. Megan Salon this morning who is going to start her on vancomycin. I had sent him a secure a text message yesterday. I also spoke with her podiatric surgeon [name] about surgery on this foot the options for conserving a functional foot etc. Promised me he would see her and will make back consultation today. Paradoxically her actual wound on the plantar aspect of her left foot looks really quite good. I had given her 5 days worth of Baxdella to cover her for MRSA. Her MRI came back showing osteomyelitis within the third metatarsal shaft and head and base of the third and  fourth proximal phalanx. She had extensive inflammatory changes throughout the soft tissue of the lateral forefoot. With an ill-defined fluid around the fourth metatarsal extending into the  plantar and dorsal soft tissues 09/19/17; the patient is actually on oral Septra and Flagyl. She apparently refused IV vancomycin. She also saw [name] at my request who is planning her for a left BKA sometime in mid July. MRI showed osteomyelitis within the third metatarsal shaft and head and the basis of the third and fourth proximal phalanx. I believe there was felt to be possible septic arthritis involving the third MTP. 09/26/17; the patient went back to Dr. Megan Salon at my suggestion and is now receiving IV daptomycin. Her wound continues to look quite good making the decision to proceed with a transmetatarsal amputation although more difficult for the patient. I believe in my extensive discussions with her she has a good sense of the pros and cons of this. I don't NV the tuft decision she has to make. She has an appointment with [name] I believe in mid July and I previously spoken to him about this issue Has we had used 3 previous Dermagraft. Given the condition of the wound surface I went ahead and added the fourth one today area and I did this not fully realizing that she'll be traveling to [place] next week. I'm hopeful she can come back in 2 weeks 10/21/17; Her same Dermagraft on for about 3-1/2 weeks. In spite of this the wound arrives looking quite healthy. There is been a lot of healing dimensions are smaller. Looking at the square shaped wound she has now there is some undermining and some depth medially under the undermining although I cannot palpate any bone. No surrounding infection is obvious. She has difficult questions about how to look at this going forward vis--vis amputations versus continued medical therapy. T be truthful the wound is looks o so healthy and it is continued to contract.  Hard to justify foot surgery at this point although I still told her that I think it might come to that if we are not able to eradicate the underlying MRSA. She is still highly at risk and she understands this 11/06/17 on evaluation today patient appears to be doing better in regard to her foot ulcer. She's been tolerating the dressing changes without complication. Currently she is here for her Dermagraft #6. Her wound continues to make excellent progress at this point. She does not appear to have any evidence of infection which is good news. 11/13/17 on evaluation today patient appears to be doing excellent at this time. She is here for repeat Dermagraft application. This is #7. Overall her wound seems to be making great progress. 12/05/17; the patient arrives with the wound in much better condition than when I last saw this almost 6 weeks ago. She still has a small probing area in the left metatarsal head region on the lateral aspect of her foot. We applied her last Dermagraft today. Since the last time she is here she has what appears to a been a blood blister on the plantar aspect of left foot although I don't see this is threatening. There is also a thick raised tissue on the right mid metatarsal head region. This was not there I don't think the last time she was here 3 weeks ago. 12/12/17; the patient continues to have a small programming area in the left metatarsal head region on the lateral aspect of her foot which was the initial large surgical wound. I applied her last Apligraf last week. I'm going to use Endoform starting today Unfortunately she has an excoriated area in the left mid foot and  the right mid foot. The left midfoot looks like a blistered area this was not opened last week it certainly is open today. Using silver alginate on these areas. She promises me she is offloading this. 12/19/17; the small probing area in the left metatarsal head eyes think is shallower. In general her original  wound looks better. We've been using Endoform. The area inferiorly that I think was trauma last week still requires debridement a lot of nonviable surface which I removed. She still has an open open area distally in her foot Similarly on the right foot there is tightly adherent surface debris which I removed. Still areas that don't look completely epithelialized. This is a small open area. We used silver alginate on these areas 12/26/2017; the patient did not have the supplies we ordered from last week including the Endoform. The original large wound on the left lateral foot looks healthy. She still has the undermining area that is largely unchanged from last week. She has the same heavily callused raised edged wounds on the right mid and left midfoot. Both of these requiring debridement. We have been using silver alginate on these areas 01/02/2018; there is still supply issues. We are going to try to use Prisma but I am not sure she actually got it from what she is saying. She has a new open area on the lateral aspect of the left fourth toe [previous fifth ray amputation]. Still the one tunneling area over the fourth metatarsal head. The area is in the midfoot bilaterally still have thick callus around them. She is concerned about a raised swelling on the lateral aspect of the foot. However she is completely insensate 01/10/2018; we are using Prisma to the wounds on her bilateral feet. Surprisingly the tunneling area over the left fourth metatarsal head that was part of her original surgery has closed down. She has a small open area remaining on the incision line. 2 open areas in the midfoot. 02/10/2018; the patient arrives back in clinic after a month hiatus. She was traveling to visit family in [place]. Is fairly clear she was not offloading the areas on her feet. The original wound over the left lateral foot at the level of metatarsal heads is reopened and probes medially by about a centimeter or  2. She notes that a week ago she had purulent drainage come out of an area on the left midfoot. Paradoxically the worst area is actually on the right foot is extensive with purulent drainage. We will use silver alginate today 02/17/2018; the patient has 3 wounds one over the left lateral foot. She still has a small area over the metatarsal heads which is the remnant of her original surgical wound. This has medial probing depth of roughly 1.4 cm somewhat better than last week. The area on the right foot is larger. We have been using silver alginate to all areas. The area on the right foot and left foot that we cultured last week showed both Klebsiella and Proteus. Both of these are quinolone sensitive. The patient put her's self on Bactrim and Flagyl that she had left hanging around from prior antibiotic usages. She was apparently on this last week when she arrived. I did not realize this. Unfortunately the Bactrim will not cover either 1 of these organisms. We will send in Cipro 500 twice daily for a week 03/04/2018; the patient has 2 wounds on the left foot one is the original wound which was a surgical wound for a deep DFU.  At one point this had exposed bone. She still has an area over the fourth metatarsal head that probes about 1.4 cm although I think this is better than last week. I been using silver nitrate to try and promote tissue adherence and been using silver alginate here. She also has an area in the left midfoot. This has some depth but a small linear wound. Still requiring debridement. On the right midfoot is a circular wound. A lot of thick callus around this area. We have been using silver alginate to all wound areas She is completed the ciprofloxacin I gave her 2 weeks ago. 03/11/2018; the patient continues to have 2 open areas on the left foot 1 of which was the original surgical wound for a deep DFU. Only a small probing area remains although this is not much different from last  week we have been using silver alginate. The other area is on the midfoot this is smaller linear but still with some depth. We have been using silver alginate here as well On the right foot she has a small circular wound in the mid aspect. This is not much smaller than last time. We have been using silver alginate here as well 03/18/2018; she has 3 wounds on the left foot the original surgical wound, a very superficial wound in the mid aspect and then finally the area in the mid plantar foot. She arrives in today with a very concerning area in the wound in the mid plantar foot which is her most proximal wound. There is undermining here of roughly 1-1/2 cm superiorly. Serosanguineous drainage. She tells me she had some pain on for over the weekend that shot up her foot into her thigh and she tells me that she had a nodule in the groin area. She has the single wound in the right foot. We are using endoform to both wound areas 03/24/2018; the patient arrives with the original surgical wound in the area on the left midfoot about the same as last week. There is a collection of fluid under the surface of the skin extending from the surgical wound towards the midfoot although it does not reach the midfoot wound. The area on the right foot is about the same. Cultures from last week of the left midfoot wound showed abundant Klebsiella abundant Enterococcus faecalis and moderate methicillin resistant staph I gave her Levaquin but this would have only covered the Klebsiella. She will need linezolid 04/01/2018; she is taking linezolid but for the first few days only took 1 a day. I have advised her to finish this at twice daily dosing. In any case all of her wounds are a lot better especially on the left foot. The original surgical wound is closed. The area on the left midfoot considerably smaller. The area on the right foot also smaller. 04/08/2018; her original surgical wound/osteomyelitis on the left foot  remains closed. She has area on the left foot that is in the midfoot area but she had some streaking towards this. This is not connected with her original wound at least not visually. Small wound on the right midfoot appears somewhat smaller. 04/15/18; both wounds looks better. Original wound is better left midfoot. Using silver alginate 1/21; patient states she uses saltwater soak in, stones or remove callus from around her wounds. She is also concerned about a blood blister she had on the left foot but it simply resolved on its own. We've been using silver alginate 1/28; the patient arrives today with  the same streaking area from her metatarsals laterally [the site of her original surgical wound] down to the middle of her foot. There is some drainage in the subcutaneous area here. This concerns me that there is actually continued ongoing infection in the metatarsals probably the fourth and third. This fixates an MRI of the foot without contrast [chronic renal failure] The wound in the mid part of the foot is small but I wonder whether this area actually connects with the more distal foot. The area on the right midfoot is probably about the same. Callus thick skin around the small wound which I removed with a curette we have been using silver alginate on both wound areas 2/4; culture I did of the draining site on the left foot last time grew methicillin sensitive staph aureus. MRI of the left foot showed interval resolution of the findings surrounding the third metatarsal joint on the prior study consistent with treated osteomyelitis. Chronic soft tissue ulceration in the plantar and lateral aspect of the forefoot without residual focal fluid collection. No evidence of recurrent osteomyelitis. Noted to have the previous amputation of the distal first phalanx and fifth ray MRI of the right foot showed no evidence of osteomyelitis I am going to treat the patient with a prolonged course of antibiotics  directed against MSSA in the left foot 2/11; patient continues on cephalexin. She tells me she had nausea and vomiting over the weekend and missed 2 days. In general her foot looks much the same. She has a small open area just below the left fourth metatarsal head. A linear area in the left midfoot. Some discoloration extending from the inferior part of this into the left lateral foot although this appears to be superficial. She has a small area on the right midfoot which generally looks smaller after debridement 2/18; the patient is completing his cephalexin and has another 2 days. She continues to have open areas on the left and right foot. 2/25; she is now off antibiotics. The area on the left foot at the site of her original surgical wound has closed yet again. She still has open areas in the mid part of her foot however these appear smaller. The area on the right mid foot looks about the same. We have been using silver alginate She tells me she had a serious hypoglycemic spell at home. She had to have EMS called and get IV dextrose 3/3; disappointing on the left lateral foot large area of necrotic tissue surrounding the linear area. This appears to track up towards the same original surgical wound. Required extensive debridement. The area on the right plantar foot is not a lot better also using silver 3/12; the culture I did last time showed abundant enterococcus. I have prescribed Augmentin, should cover any unrecognized anaerobes as well. In addition there were a few MRSA and Serratia that would not be well covered although I did not want to give her multiple antibiotics. She comes in today with a new wound in the right midfoot this is not connected with the original wound over her MTP a lot of thick callus tissue around both wounds but once again she said she is not walking on these areas 3/17-Patient comes in for follow-up on the bilateral plantar wounds, the right midfoot and the left  plantar wound. Both these are heavily callused surrounding the wounds. We are continuing to use silver alginate, she is compliant with offloading and states she uses a wheelchair fairly often at home 3/24;  both wound areas have thick callus. However things actually look quite a bit better here for the majority of her left foot and the right foot. 3/31; patient continues to have thick callused somewhat irritated looking tissue around the wounds which individually are fairly superficial. There is no evidence of surrounding infection. We have been using silver alginate however I change that to [facility] today 4/17; patient returns to clinic after having a scare with Covid she tested negative in her primary doctor's office. She has been using Hydrofera Blue. She does not have an open area on the right foot. On the left foot she has a small open area with the mid area not completely viable. She showed me pictures of what looks like a hemorrhagic blister from several days ago but that seems to have healed over this was on the lateral left foot 4/21; patient comes in to clinic with both her wounds on her feet closed. However over the weekend she started having pain in her right foot and leg up into the thigh. She felt as though she was running a low-grade fever but did not take her temperature. She took a doxycycline that she had leftover and yesterday a single Septra and metronidazole. She thinks things feel somewhat better. 4/28; duplex ultrasound I ordered last week was negative for DVT or superficial thrombophlebitis. She is completed the doxycycline I gave her. States she is still having a lot of pain in the right calf and right ankle which is no better than last week. She cannot sleep. She also states she has a temperature of up to 101, coughing and complaining of visual loss in her bilateral eyes. Apparently she was tested for Covid 2 weeks ago at [facility] and that was negative. Readmission: 09/03/18  patient presents back for reevaluation after having been evaluated at the end of April regarding erythema and swelling of her right lower extremity. Subsequently she ended up going to the hospital on 07/29/18 and was admitted not to be discharged until 08/08/18. Unfortunately it was noted during the time that she was in the hospital that she did have methicillin-resistant Staphylococcus aureus as the infection noted at the site. It was also determined that she did have osteomyelitis which appears to be fairly significant. She was treated with vancomycin and in fact is still on IV vancomycin at dialysis currently. This is actually slated to continue until 09/12/18 at least which will be the completion of the six weeks of therapy. Nonetheless based on what I'm seeing at this point I'm not sure she will be anywhere near ready to discontinue antibiotics at that time. Since she was released from the hospital she was seen by [name] who is her podiatrist on 08/27/18. His note specifically states that he is recommended that the patient needs of one knee amputation on the right as she has a life- threatening situation that can lead quickly to sepsis. The patient advised she would like to try to save her leg to which [name] apparently told her that this was against all medical advice. She also want to discontinue the Wound VAC which had been initiated due to the fact that she wasn't pleased with how the wound was looking and subsequently she wanted to pursue applying Medihoney at that time. He stated that he did not believe that the right lower extremity was salvageable and that the patient understood but would still like to attempt hyperbaric option therapy if it could be of any benefit. She was  therefore referred back to [place] for further evaluation. He plans to see her back next week. Upon inspection today patient has a significant amount purulent drainage noted from the wound at this point. The bone in the  distal portion of her foot also appears to be extremely necrotic and spongy. When I push down on the bone it bubbles and seeps purulent drainage from deeper in the end of the foot. I do not think that this is likely going to heal very well at all and less aggressive surgical debridement were undertaken more than what I believe we can likely do here in our office. 09/12/2018; I have not seen this patient since the most recent hospitalization although she was in our clinic last week. I have reviewed some of her records from a complex hospitalization. 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 There is situation was complicated by MRSA bacteremia and acute on chronic renal failure now on dialysis. She is receiving vancomycin at dialysis. We started her on Dakin's wet-to-dry last week she is changing this daily. There is still purulent drainage coming out of her foot. Although she is apparently "agreeable" to a below-knee amputation which is been suggested by multiple clinicians she wants this to be done in [place]. She apparently has a telehealth visit with that provider sometime in late [place] 6/24. I have told her I think this is probably too long. Nevertheless I could not convince her to allow a local doctor to perform BKA. 09/19/2018; the patient has a large necrotic area on the right anterior foot. She has had previous transmetatarsal amputations. Culture I did last week showed MRSA nothing else she is on vancomycin at dialysis. She has continued leaking purulent drainage out of the distal part of the large circular wound on the right anterior foot. She apparently went to see Dr. 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 Somehow that translated into her being referred to plastic surgery for debridement of the area. I gather she basically refused amputation although I do not have a copy of Dr. Berenice Primas notes. The patient  really wants to have a trial of hyperbaric oxygen. I agreed with initial assessment in this clinic that this was probably too far along to benefit however if she is going to have plastic surgery I think she would benefit from ancillary hyperbaric oxygen. The issue here is that the patient has benefited as maximally as any patient I have ever seen from hyperbaric oxygen therapy. Most recently she had exposed bone on the lateral part of her left foot after a surgical procedure and that actually has closed. She has eschared areas in both heels but no open area. She is remained systemically well. I am not optimistic that anything can be done about this but the patient is very clear that she wants an attempt. The attempt would include a wound VAC further debridements and hyperbaric oxygen along with IV antibiotics. 6/26; I put her in for a trial of hyperbaric oxygen only because of the dramatic response she has had with wounds on her left midfoot earlier this year which was a surgical wound that went straight to her bone over the metatarsal heads and also remotely the left third toe. We will see if we can get this through our review process and insurance. She arrives in clinic with again purulent material pouring out of necrotic bone on the top of the foot distally. There is also some  concerning erythema on the front of the leg that we marked. It is bit difficult to tell how tender this is because of neuropathy. I note from infectious disease that she had her vancomycin extended. 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 She had the wound VAC on for part of the week. The rest of the time she is putting various things on this including Medihoney, "ionized water" silver sorb gel etc. 7/7; follow-up along with HBO. She is still on vancomycin at dialysis. She has a large open area on the dorsal right foot and a small dark eschar area on her heel. There is a lot less erythema in  the area and a lot less tenderness. From an infection point of view I think this is better. She still has a lot of necrosis in the remaining right forefoot [previous TMA] we are still using the wound VAC in this area 7/16; follow-up along with HBO. I put her on linezolid after she finished her vancomycin. We started this last Friday I gave her 2 weeks worth. I had the expectation that she would be operatively debrided by Dr. Marla Roe but that still has not happened yet. Patient phoned the office this week. She arrives for review today after HBO. The distal part of this wound is completely necrotic. Nonviable pieces of tendon bone was still purulent drainage. Also concerning that she has black eschar over the heel that is expanding. I think this may be indicative of infection in this area as well. She has less erythema and warmth in the ankle and calf but still an abnormal exam 7/21 follow-up along with HBO. I will renew her linezolid after checking a CBC with differential monitoring her blood counts especially her platelets. She was supposed to have surgery yesterday but if I am reading things correctly this was canceled after her blood sugar was found to be over 500. I thought Dr. Marla Roe who called me said that they were sending her to the ER but the patient states that was not the case. 7/28. Follow-up along with HBO. She is on linezolid I still do not have any lab work from dialysis even though I called last week. The patient is concerned about an area on her left lateral foot about the level of the base of her fifth metatarsal. I did not really see anything that ominous here however this patient is in [place] ability to point out problems that she is sensing and she has been accurate in the past Finally she received a call from Dr. Marla Roe who is referring her to another orthopedic surgeon stating that she is too booked up to take her to the operating room now. Was still using a wound VAC  on the foot 8/3 -Follow-up after HBO, she is got another week of linezolid, she is to call ID for an appointment, x-rays of both feet were reviewed, the left foot x-ray with third MTP joint osteo- Right foot x-ray widespread osteo-in the right midfoot Right ankle x-ray does not show any active evidence of infection 8/11-Patient is seen after HBO, the wounds on the right foot appear to be about the same, the heel wound had some necrotic base over tendon that was debrided with a curette 8/21; patient is seen after HBO. The patient's wound on her dorsal foot actually looks reasonably good and there is substantial amount of epithelialization however the open area distally still has a lot of necrotic debris partially bone. I cannot really  get a good sense of just how deep this probes under the foot. She has been pressuring me this week to order medical maggots through a company in [place] for her. The problem I have is there is not a defined wound area here. On the positive side there is no purulence. She has been to see infectious disease she is still on Septra DS although I have not had a chance to review their notes 8/28; patient is seen in conjunction with HBO. The wounds on her foot continued to improve including the right dorsal foot substantially the, the distal part of this wound and the area on the right heel. We have been using a wound VAC over this chronically. She is still on trimethoprim as directed by infectious disease 9/4; patient is seen in conjunction with HBO. Right dorsal foot wound substantially anteriorly is better however she continues to have a deep wound in the distal part of this that is not responding. We have been using silver collagen under border foam Area on the right plantar medial heel seems better. We have been using Hydrofera Blue 12/12/18 on evaluation today patient appears to be doing about the same with regard to her wound based on prior measurements. She does have  some necrotic tissue noted on the lateral aspect of the wound that is going require a little bit of sharp debridement today. This includes what appears to be potentially either severely necrotic bone or tendon. Nonetheless other than that she does not appear to have any severe infection which is good news 9/18; it is been 2 weeks since I saw this wound. She is tolerating HBO well. Continued dramatic improvement in the area on the right dorsal foot. She still has a small wound on the heel that we have been using Hydrofera Blue. She continues with a wound VAC 9/24; patient has to be seen emergently today with a swelling on her right lateral lower leg. She says that she told Dr. Evette Doffing about this and also myself on a couple of occasions but I really have no recollection of this. She is not systemically unwell and her wound really looked good the last time I saw this. She showed this to providers at dialysis and she was able to verify that she was started on cephalexin today for 5 doses at dialysis. She dialyzes on Tuesday Thursday and Saturday. 10/2; patient is seen in conjunction with HBO. The area that is draining on the right anterior medial tibia is more extensive. Copious amounts of serosanguineous drainage with some purulence. We are still using the wound VAC on the original wound then it is stable. Culture I did of the original IandD showed MRSA I contacted dialysis she is now on vancomycin with dialysis treatments. I asked them to run a month 10/9; patient seen in conjunction with HBO. She had a new spontaneous open area just above the wound on the right medial tibia ankle. More swelling on the right medial tibia. Her wound on the foot looks about the same perhaps slightly better. There is no warmth spreading up her leg but no obvious erythema. her MRI of the foot and ankle and distal tib-fib is not booked for next Friday I discussed this with her in great detail over multiple days. it is  likely she has spreading infection upper leg at least involving the distal 25% above the ankle. She knows that if I refer her to orthopedics for infectious disease they are going to recommend amputation and indeed  I am not against this myself. We had a good trial at trying to heal the foot which is what she wanted along with antibiotics debridement and HBO however she clearly has spreading infection [probably staph aureus/MRSA]. Nevertheless she once again tells me she wants to wait the left of the MRI. She still makes comments about having her amputation done in [place]. 10/19; arrives today with significant swelling on the lateral right leg. Last culture I did showed Klebsiella. Multidrug-resistant. Cipro was intermediate sensitivity and that is what I have her on pending her MRI which apparently is going to be done on Thursday this week although this seems to be moving back and forth. She is not systemically unwell. We are using silver alginate on her major wound area on the right medial foot and the draining areas on the right lateral lower leg 10/26; MRI showed extensive abscess in the anterior compartment of the right leg also widespread osteomyelitis involving osseous structures of the midfoot and portions of the hindfoot. Also suspicion for osteomyelitis anterior aspect of the distal medial malleolus. Culture I did of the purulence once again showed a multidrug-resistant Klebsiella. I have been in contact with nephrology late last week and she has been started on cefepime at dialysis to replace the vancomycin We sent a copy of her MRI report to Dr. Geroge Baseman in [place] who is an orthopedic surgeon. The patient takes great stock in his opinion on this. She says she will go to [place] to have her leg amputated if Dr. Geroge Baseman does not feel there is any salvage options. 11/2; she still is not talk to her orthopedic surgeon in [place]. Apparently he will call her at [phone] this afternoon.  The quality of this is she has not allowed me to refer her anywhere. She has been told over and over that she needs this amputated but has not agreed to be referred. She tells me her blood sugar was 600 last night but she has not been febrile. 11/9; she never did got a call from the orthopedic surgeon in [place] therefore that is off the radar. We have arranged to get her see orthopedic surgery at [facility]. She still has a lot of draining purulence coming out of the new abscess in her right leg although that probably came from the osteomyelitis in her right foot and heel. Meanwhile the original wound on the right foot looks very healthy. Continued improvement. The issue is that the last MRI showed osteomyelitis in her right foot extensively she now has an abscess in the right anterior lower leg. There is nobody in [place] who will offer this woman anything but an amputation and to be honest that is probably what she needs. I think she still wants to talk about limb salvage although at this point I just do not see that. She has completed her vancomycin at dialysis which was for the original staph aureus she is still on cefepime for the more recent Klebsiella. She has had a long course of both of these antibiotics which should have benefited the osteomyelitis on the right foot as well as the abscess. 11/16; apparently [place] elective surgery is shut down because of COVID-19 pandemic. I have reached out to some contacts at [facility] to see if we can get her an orthopedic appointment there. I am concerned about continually leaving this but for the moment everything is static. In fact her original large wound on this foot is closing down. It is the abscess on the right anterior  leg that continues to drain purulent serosanguineous material. She is not currently on any antibiotics however she had a prolonged course of vancomycin [1 month] as well as cefepime for a month 02/24/2019 on evaluation  today patient appears to be doing better than the last time I saw her. This is not a patient that I typically see. With that being said I am covering for Dr. Dellia Nims this week and again compared to when I last saw her overall the wounds in particular seem to be doing significantly better which is good news. With that being said the patient tells me several disconcerting things. She has not been able to get in to see anyone for potential debridement in regard to her leg wounds although she tells me that she does not think it is necessary any longer because she is taking care of that herself. She noticed a string coming out of the lower wound on her leg over the last week. The patient states that she subsequently decided that we must of pack something in there and started pulling the string out and as it kept coming and coming she realized this was likely her tendon. With that being said she continued to remove as much of this as she could. She then I subsequently proceeded to using tubes of antibiotic ointment which she will stick down into the wound and then scored as much as she can until she sees it coming out of the other wound opening. She states that in doing this she is actually made things better and there is less redness and irritation. With regard to her foot wound she does have some necrotic tendon and tissue noted in one small corner but again the actual wound itself seems to be doing better with good granulation in general compared to my last evaluation. 12/7; continued improvement in the wound on the substantial part of the right medial foot. Still a necrotic area inferiorly that required debridement but the rest of this looks very healthy and is contracting. She has 2 wounds on the right lateral leg which were her original drainage sites from her abscess but all of this looks a lot better as well. She has been using silver alginate after putting antibiotic biotic ointment in one wound and  watching it come out the other. I have talked to her in some detail today. I had given her names of orthopedic surgeons at [facility] for second opinion on what to do about the right leg. I do not think the patient never called them. She has not been able to get a hold of the orthopedic surgeon in [place] that she had put a lot of faith in as being somebody would give her an opinion that she would trust. I talked to her today and said even if I could get her in to another orthopedic surgeon about the leg which she accept an amputation and she said she would not therefore I am not going to press this issue for the moment 12/14; continued improvement in his substantial wound on the right medial foot. There is still a necrotic area inferiorly with tightly adherent necrotic debris which I have been working on debriding each time she is here. She does not have an orthopedic appointment. Since last time she was here I looked over her cultures which were essentially MRSA on the foot wound and gram-negative rods in the abscess on the anterior leg. 12/21; continued improvement in the area on the right medial foot. She is  not up on this much and that is probably a good thing since I do not know it could support continuous ambulation. She has a small area on the right lateral leg which were remanence of the IandD's I did because of the abscess. I think she should probably have prophylactic antibiotics I am going to have to look this over to see if we can make an intelligent decision here. In the meantime her major wound is come down nicely. Necrotic area inferiorly is still there but looks a lot better 04/06/2019; she has had some improvement in the overall surface area on the right medial foot somewhat narrowedr both but somewhat longer. The areas on the right lateral leg which were initial IandD sites are superficial. Nothing is present on the right heel. We are using silver alginate to the wound areas 1/18;  right medial foot somewhat smaller. Still a deep probing area in the most distal recess of the wound. She has nothing open on the right leg. She has a new wound on the plantar aspect of her left fourth toe which may have come from just pulling skin. The patient using Medihoney on the wound on her foot under silver alginate. I cannot discourage her from this 2/1; 2-week follow-up using silver alginate on the right foot and her left fourth toe. The area on the right dorsal foot is contracted although there is still the deep area in the most distal part of the wound but still has some probing depth. No overt infection 2/15; 2-week follow-up. She continues to have improvement in the surface area on the dorsal right foot. Even the tunneling area from last time is almost closed. The area that was on the plantar part of her left fourth toe over the PIP is indeed closed 3/1; 2-week follow-up. Continued improvement in surface area. The original divot that we have been debriding inferiorly I think has full epithelialization although the epithelialization is gone down into the wound with probably 4 mm of depth. Even under intense illumination I am unable to see anything open here. The remanence of the wound in this area actually look quite healthy. We have been using silver alginate 3/15; 2-week follow-up. Unfortunately not as good today. She has a comma shaped wound on the dorsal foot however the upper part of this is larger. Under illumination debris on the surface She also tells [place] that she was on her right leg 2 times in the last couple of weeks mostly to reach up for things above her head etc. She felt a sharp pain in the right leg which she thinks is somewhere from the ankle to the knee. The patient has neuropathy and is really uncertain. She cannot feel her foot so she does not think it was coming from there 3/29; 2-week follow-up. Her wound measures smaller. Surface of the wound appears reasonable. She  is using silver alginate with underlying Medihoney. She has home health. X-rays I did of her tib-fib last time were negative although it did show arterial calcification 4/12; 2-week follow-up. Her wound measures smaller in length. Using manuka honey with silver alginate on top. She has home health. 4/26; 2-week follow-up. Her wound is smaller but still very adherent debris under illumination requiring debridement she has been using manuka honey with silver alginate. 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 Been using Hydrofera Blue. She is  on suppressive Bactrim but apparently she has been taking it twice daily 6/7; I have not seen this wound and about 6 weeks. Since then she was up in [place]. By her own admission she was walking on the foot because she did not have a wheelchair. The wound is not nearly as healthy looking as it was the last time I saw this. We ordered different things for her but she only uses Medihoney and silver alginate. As far as I know she is on suppressive trimethoprim sulfamethoxazole. She does not admit to any fever or chills. Her CBGs apparently are at baseline however she is saying that she feels some discomfort on the lateral part of her ankle I looked over her last inflammatory markers from the summer 2020 at which time she had a deeply necrotic infected wound in this area. On 11/10/2018 her sedimentation rate was 56 and C-reactive protein 9.9. This was 107 and 29 on 07/29/2018. 6/17; the patient had a necrotic wound the last time she was here on the right dorsal foot. After debridement I did a culture. This showed a very resistant ESBL Klebsiella as well as Enterococcus. Her x-ray of the foot which was done because of warmth and some discomfort showed bone destruction within the carpal bones involving the navicular acute cuboid lateral middle cuneiforms but  essentially unchanged from her prior study which was done on 10/29/2018. The findings were felt to represent chronic osteomyelitis. We did inflammatory markers on her. Her white count was 5.25 sedimentation rate 16 and C-reactive protein at 11.1. Notable for the fact that in August 2020 her CRP was 9.9 and sedimentation rate 56. I have looked at her x-rays. It is true that the bone destruction is very impressive however the patient came into this clinic for the wound on her right foot with pieces of bone literally falling out anteriorly with purulent material. I am not exactly sure I could have expected anything different. She has not been systemically unwell no fever chills or blood sugars have been reasonable. 6/28; she arrives with a right heel closed. The substantial area on the right anterior foot looks healthy. Much better looking surface. I think we can change to [facility] seems to help this previously. She is getting her antibiotics at dialysis she should be just about finished 7/9; changed to [facility] last week. Surface wound looks satisfactory not much change in surface area however. She is going to [place] state next week this is usually a difficult thing for this patient follow-up will be for 2 weeks. 7/23; using Hydrofera Blue. She returns from her trip and the wound looks surprisingly good. Usually when this patient goes on trips she comes back with a lot of problems with the wound. She is saying that she sometimes feels an episodic "crunching" feeling on the lateral part of the foot. She is neuropathic and not feeling pain but wonders whether this could be a neuropathic dysesthesia. 11/13/19-Patient returns after 3 weeks, the wound itself is stable and patient states that there is nothing new going on she is on some extra anxiety medications and is resisting the temptation to pick at the dry skin around the wound. 9/20; patient has not been here in over a month and I have  not seen her in 2 months. The wound in terms of size I think is about the same. There is no exposed bone. She has a nonviable surface on this. She is supposed to be using [facility] however she  is also been using some form of honey preparation as well as a silver-based dressings. I do not think she has any pattern to this. 10/4; 2-week follow-up. Patient has been using some form of spray which she says has honey and silver to purchase this online she has been covering it with gauze. In spite of this the wound actually looks quite good. The deeper divot distally appears to be close down. There is a rim of epithelialization. 10/18; 2-week follow-up. Patient has been using her Hydrofera Blue covered with her silver honey spray that she got online. 11/1; 2-week follow-up. She is using Hydrofera Blue with a silver honey spray. Wound bed is measuring smaller. She has noticed that her foot is warmer on the right. She is concerned about infection. For a long period of time I had her on prophylactic trimethoprim sulfamethoxazole DS 1 tablet daily. She is asking for this to be restarted. The patient is walking on this foot because of repairs that are being done in a home her but her room is on the second floor she has to go up and down stairs. I have cautioned against this however as usual she will do exactly what she wants to do 11/15; 2-week follow-up. She uses Hydrofera Blue with a silver/honey spray which I have never heard of. I think her wound looks about the same. Some epithelialization. No evidence that this is infected. I think she is walking on this more than we agreed on. She is going on extensive vacation over Thanksgiving 12/3; 2-week follow-up. She is using Hydrofera Blue however over Thanksgiving she ran out of this and she is simply been using Medihoney. In spite of this her wound is smaller almost divided into 2 now. She traveled extensively over Thanksgiving and actually looks quite good in  spite of this. Usually this is been a marker of problems for her 12/17; 2-week follow-up. She is using Hydrofera Blue. The wound is smaller. Debris on the surface of this is fibrinous. She is traveling to [place] over the holidays which never bodes well for her wounds. I think she is walking more on her feet then she is even willing to admit and she tells me she does walk 2/11; using Hydrofera Blue. Her wounds are contracting however she walks in the clinic with a history that she has not been able to eat she has had vomiting. She also has right eye problems. Her blood sugar was 567. She had blood work done at dialysis and we called there to get her blood work although it still had not returned although they should have this by the end of the day we were told. She also stated that she was not sure what her blood sugar was as her glucose monitor was not working and not coming to tomorrow. She also for some reason does not think her insulin pump is working well. Her endocrinologist is Dr. Elayne Snare 06/03/2020 upon evaluation today patient actually appears to be doing excellent in regard to her wound. In fact she tells me it was not even bleeding until she picked a dry piece of skin off while we were getting ready to come in and see her today. Fortunately there is no signs of active infection which is great news and overall very pleased. She is going to be seeing her neurologist sometime shortly I believe it might even be on Monday. Nonetheless there can proceed with the work-up as far as anything else going on with her eye  the fact that her right eyelid is drooping. 3/25; right medial dorsal foot. She has superficial areas here that appear to be fully epithelialized she is using Medihoney and silver alginate and some combination. She has a new area on the mid left foot at roughly the level of the fifth metatarsal base 4/84/8; right medial dorsal foot. Still superficial areas here. I cleaned up 1 of  these today she has been using I think mostly Medihoney but I would like her to use silver alginate. The area on the mid part of her foot also looks improved on the left. Since she was last here she tells me that she noted pus in the area of her left foot. She told dialysis about this they did a culture and ultimately she is on IV antibiotics but were not sure which one. They referred her to [name] he said that he did not need to follow her. I have not really verified any of this and I do not know what antibiotic she is on at dialysis 4/29; patient has been using Medihoney to her wounds. She reports taking off the toenail to her left second toe. She states that home health discharged her and she has not had help with dressing changes. She denies any signs and symptoms of infection. 5/13; 2-week follow-up. Miraculously the wound on the right foot dorsally is healed. She has an area on the tip of her left third toe and an area in the left midfoot. 5/26; patient presents for 2-week follow-up. She has 2 open wounds 1 to her plantar foot and the other to the left third toe. She has noticed increased swelling and redness to the toe wound. It is unclear exactly what she is doing for dressing changes as she has multiple dressing options at home. She states that it is easiest to do [facility] on the plantar wound and Prisma or Medihoney on the toe wound. 6/3; saw Dr. Maryjane Hurter week. She put her on Keflex because of the swelling I think of the left second toe. She arrives today with new breakdown x3 on the dorsal right foot which is disappointing. She also complains of pain in the right ankle, 3 liquid stools per day, chills without fever 6/17; patient was in [place]. I think a little more ambulatory than I would like to hear. In any case she still has the 3 small open areas on the dorsal part of her foot on the right medial leg these look about the same as last time. She is supposed to be using Prisma but I  think used Medihoney. She has an area on the left lateral plantar foot which had undermining under her skin and a blood blister with the 2 areas communicating today. And finally an area on the tip of her left second toe 6/30; patient has 3 small areas that look better than the last time on the right dorsal foot. She uses a combination of her products including collagen, Hydrofera Blue and the Medihoney nevertheless they look better. The area on the left midfoot might have been healed although she pulled some skin off superficial open area. The left second toe still does not look that healthy. Open areas on the tips of the 7/21; the areas on her right dorsal foot are still present although they look benign. We are using silver collagen although she may be using any of the products she had at home. The real problem here is the left second toe. Necrotic wound over  the distal phalanx and distal interphalangeal joint. This probes directly to bone. She arrives in clinic today with an interesting story. She went to [place] last week. She became ill there with refractory nausea and vomiting. She went to an urgent care and then the ER. I am able to see parts of this in Care Everywhere. She had 2 blood cultures done both of which ultimately showed MRSA. This was sensitive to daptomycin and linezolid and vancomycin. Her white count was 4.8 hemoglobin 10.4. X-ray of the left foot showed prior resection of the left fifth metatarsal no evidence of osteomyelitis. Her C-reactive protein on 10/13/2020 was 104.8. She refused admission to the hospital as she was to board her flight to return to [place]. She dialyzed this morning I am not certain why she did not bring this to their attention 7/27; fortunately the left second toe and the erythema in her left foot looks a lot better on the IV vancomycin at dialysis. I was able to verify earlier this week that she had indeed received it on the weekend starting last  Saturday. She will need at least 4 weeks. I saw her left arm shunt and it looks fine. We are using silver alginate on the toe and we have been using I think Medihoney on the right foot which is actually been her decision. She has 2 open wounds dorsally. She does not describe fever chills her blood sugars are erratic but seem to be at her baseline Electronic Signature(s) Signed: 10/26/2020 4:50:28 PM By: Linton Ham MD Entered By: Linton Ham on 10/26/2020 14:41:51 -------------------------------------------------------------------------------- Physical Exam Details Patient Name: Date of Service: [name] 10/26/2020 1:45 PM Medical Record Number: [id number] Patient Account Number: [id number] Date of Birth/Sex: Treating RN: 23-Dec-1970 (50 y.o. [name] Primary Care Provider: [name], [name] Other Clinician: Referring Provider: Treating Provider/Extender: [name], [name] Weeks in Treatment: 112 Constitutional Patient is hypertensive.. Pulse regular and within target range for patient.Marland Kitchen Respirations regular, non-labored and within target range.. Temperature is normal and within the target range for the patient.Marland Kitchen Appears in no distress. Notes Wound exam; left plantar foot remains healed. Left second toe which was necrotic and probing to bone last week with spreading cellulitis into her forefoot all looks remarkably better. The toe is epithelialized the erythema in the forefoot is gone there is still some erythema in the toe however. She has 2 open areas on the right dorsal foot I think she has been using Medihoney. We thought we were ordering silver collagen Electronic Signature(s) Signed: 10/26/2020 4:50:28 PM By: Linton Ham MD Entered By: Linton Ham on 10/26/2020 14:38:36 -------------------------------------------------------------------------------- Physician Orders Details Patient Name: Date of Service: [name]  10/26/2020 1:45 PM Medical Record Number: [id number] Patient Account Number: [id number] Date of Birth/Sex: Treating RN: 07/24/1970 (50 y.o. [name] Primary Care Provider: [name], [name] Other Clinician: Referring Provider: Treating Provider/Extender: [name], [name] Weeks in Treatment: [phone] Verbal / Phone Orders: No Diagnosis Coding ICD-10 Coding Code Description L97.514 Non-pressure chronic ulcer of other part of right foot with necrosis of bone E10.621 Type 1 diabetes mellitus with foot ulcer L97.529 Non-pressure chronic ulcer of other part of left foot with unspecified severity M86.172 Other acute osteomyelitis, left ankle and foot Follow-up Appointments Return appointment in 3 weeks. - with Dr. Dellia Nims Bathing/ Shower/ Hygiene May shower and wash wound with soap and water. Edema Control -  Lymphedema / SCD / Other Bilateral Lower Extremities Elevate legs to the level of the heart or above for 30 minutes daily and/or when sitting, a frequency of: - throughout the day Avoid standing for long periods of time. Moisturize legs daily. - with dressing changes Off-Loading Open toe surgical shoe to: - to both feet Other: - minimal weight bearing right foot Wound Treatment Wound #51 - T Second oe Wound Laterality: Left Cleanser: Soap and Water Every Other Day/30 Days Discharge Instructions: May shower and wash wound with dial antibacterial soap and water prior to dressing change. Prim Dressing: KerraCel Ag Gelling Fiber Dressing, 4x5 in (silver alginate) Every Other Day/30 Days ary Discharge Instructions: Apply silver alginate to wound bed as instructed Secondary Dressing: Woven Gauze Sponge, Non-Sterile 4x4 in (Generic) Every Other Day/30 Days Discharge Instructions: Apply over primary dressing as directed. Secured With: Child psychotherapist, Sterile 2x75 (in/in) Every Other Day/30 Days Discharge Instructions: Secure with stretch gauze as  directed. Secured With: Paper Tape, 1x10 (in/yd) Every Other Day/30 Days Discharge Instructions: Secure dressing with tape as directed. Wound #52 - Foot Wound Laterality: Right Cleanser: Soap and Water Every Other Day/30 Days Discharge Instructions: May shower and wash wound with dial antibacterial soap and water prior to dressing change. Prim Dressing: PolyMem Non-Adhesive Dressing, 4x4 in (DME) (Generic) Every Other Day/30 Days ary Discharge Instructions: Apply to wound bed as instructed Secondary Dressing: Woven Gauze Sponge, Non-Sterile 4x4 in (Generic) Every Other Day/30 Days Discharge Instructions: Apply over primary dressing as directed. Secured With: Elastic Bandage 4 inch (ACE bandage) Every Other Day/30 Days Discharge Instructions: Secure with ACE bandage as directed. Secured With: Child psychotherapist, Sterile 2x75 (in/in) Every Other Day/30 Days Discharge Instructions: Secure with stretch gauze as directed. Secured With: Paper Tape, 1x10 (in/yd) Every Other Day/30 Days Discharge Instructions: Secure dressing with tape as directed. Electronic Signature(s) Signed: 10/26/2020 4:50:28 PM By: Linton Ham MD Signed: 10/26/2020 5:49:27 PM By: [name] RN, BSN Entered By: [name] on 10/26/2020 14:26:07 -------------------------------------------------------------------------------- Problem List Details Patient Name: Date of Service: [name] 10/26/2020 1:45 PM Medical Record Number: [id number] Patient Account Number: [id number] Date of Birth/Sex: Treating RN: [birth year] (50 y.o. [name] Primary Care Provider: [name], [name] Other Clinician: Referring Provider: Treating Provider/Extender: [name], [name] Weeks in Treatment: 112 Active Problems ICD-10 Encounter Code Description Active Date MDM Diagnosis L97.514 Non-pressure chronic ulcer of other part of right foot with necrosis of bone 09/03/2018 No  Yes E10.621 Type 1 diabetes mellitus with foot ulcer 09/24/2018 No Yes L97.529 Non-pressure chronic ulcer of other part of left foot with unspecified severity 07/29/2020 No Yes M86.172 Other acute osteomyelitis, left ankle and foot 10/20/2020 No Yes Inactive Problems ICD-10 Code Description Active Date Inactive Date M86.671 Other chronic osteomyelitis, right ankle and foot 09/03/2018 09/03/2018 L97.411 Non-pressure chronic ulcer of right heel and midfoot limited to breakdown of skin 09/17/2019 09/17/2019 L97.521 Non-pressure chronic ulcer of other part of left foot limited to breakdown of skin 04/20/2019 04/20/2019 L97.812 Non-pressure chronic ulcer of other part of right lower leg with fat layer exposed 02/24/2019 02/24/2019 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 10/26/2020 1:45 PM Medical Record Number: [id number] Patient Account Number: [id number] Date of Birth/Sex: Treating RN: 04/02/1971 (50 y.o. [name] Primary Care Provider: [name], [name] Other Clinician: Referring Provider: Treating Provider/Extender: [name], [name] Weeks in Treatment: 112 Subjective History of Present Illness (HPI) When45 year old diabetic who is known to have type 1 diabetes which is poorly controlled last hemoglobin A1c was 11%. She comes in with a ulcerated area on the left lateral foot which has been there for over 6 months. Was  recently she has been treated by [name] of podiatry who saw her last on 05/28/2016. Review of his notes revealed that the patient had incision and drainage with placement of antibiotic beads to the left foot on 04/11/2016 for possible osteomyelitis of the cuboid bone. Over the last year she's had a history of amputation of the left fifth toe and a femoropopliteal popliteal bypass graft somewhere in April 2017. 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 His note [name] mentions that the patient has been referred to me for further wound care and possibly great candidate for hyperbaric oxygen therapy due to recurrent osteomyelitis. However we do not have any x-rays of biopsy reports confirming this. He has been on several antibiotics including Bactrim and most recently is on doxycycline for an MRSA. I understand, the patient was not a candidate for IV antibiotics as she has had previous PICC lines which resulted in blood clots in both arms. There was a x-ray report dated 04/04/2016 on [name] notes which showed evidence of fifth ray resection left foot with osteolytic changes noted to the fourth metatarsal and cuboid bone on the left. 06/13/2016 -- had a left foot x-ray which showed no acute fracture or dislocation and no definite radiographic evidence of osteomyelitis. Advanced osteopenia was seen. 06/20/2016 -- she has noticed a new wound on the right plantar foot in the region where she had a callus before. 06/27/16- the patient did have her x-ray of the right foot which showed no findings to suggest osteomyelitis. She saw her endocrinologist, [name], yesterday. Her A1c in January was 11. He also indicates mismanagement and noncompliance regarding her diabetes. She is currently on Bactrim for a lip infection. She is complaining of nausea, vomiting and diarrhea. She is unable to articulate the exact orders or dosing of the Bactrim; it is unclear when she will complete  this. 07/04/2016 -- results from [facility] of ABIs with ankle waveforms were noted from 02/14/2016. The examination done on 06/27/2015 showed noncompressible ABIs with the right being 1.45 and the left being 1.33. The present examination showed a right ABI of 1.19 on the left of 1.33. The conclusion was that right normal ABI in the lower extremity at rest however compared to previous study which was noncompressible ABI may be falsely elevated side suggesting medial calcification. The left ABI suggested medial calcification. 08/01/2016 -- the patient had more redness and pain on her right foot and did not get to come to see as noted she see her PCP or go to the ER and decided to take some leftover metronidazole which she had at home. As usual, the patient does report she feels and is rather noncompliant. 08/08/2016 -- -- x-ray of the right foot -- FINDINGS:Transmetatarsal amputation is noted. No bony destruction is noted to suggest osteomyelitis. IMPRESSION: No evidence of osteomyelitis. Postsurgical  changes are seen. MRI would be more sensitive for possible bony changes. Culture has grown Serratia Marcescens -- sensitive to Bactrim, ciprofloxacin, ceftazidime she was seen by [name] on 08/06/2016. He did not find any exposed bone, muscle, tendon, ligament or joint. There was no malodor and he did a excisional debridement in the office. ============ Old notes: 50 year old patient who is known to the wound clinic for a while had been away from the wound clinic since 09/01/2014. Over the last several months she has been admitted to various hospitals including [facility] [place]. She was treated for a right metatarsal osteomyelitis with a transmetatarsal amputation and this was done about 2 months ago. He has a small ulcerated area on the right heel and she continues to have an ulcerated area on the left plantar aspect of the foot. The patient was recently admitted to the [facility]  hospital group between 7/12 and 10/18/2014. she was given 3 weeks of IV vancomycin and was to follow-up with her surgeons at [facility] and also took oral vancomycin for C. difficile colitis. 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 MRI of the left foot done on 10/14/2014 showed no abscess or osteomyelitis. 04/27/15; this is a patient we know from previous stays in the wound care center. She is a type I diabetic I am not sure of her control currently. Since the last time I saw her she is had a right transmetatarsal amputation and has no wounds on her right foot and has no open wounds. She is been followed at the wound care center at [facility] in [place]. She comes today with the desire to undergo hyperbaric treatment locally. Apparently one of her wound care providers in [place] has suggested hyperbarics. This is in response to an MRI from 04/18/15 that showed increased marrow signal and loss of the proximal fifth metatarsal cortex evidence of osteomyelitis with likely early osteomyelitis in the cuboid bone as well. She has a large wound over the base of the fifth metatarsal. She also has a eschar over her the tips of her toes on 1,3 and 5. She does not have peripheral pulses and apparently is going for an angiogram tomorrow which seems reasonable. After this she is going to infectious disease at [facility]. They have been using Medihoney to the large wound on the lateral aspect of the left foot to. The patient has known Charcot deformity from diabetic neuropathy. She also has known diabetic PAD. Surprisingly I can't see that she has had any recent antibiotics, the patient states the last antibiotic she  had was at the end of November for 10 days. I think this was in response to culture that showed group G strep although I'm not exactly sure where the culture was from. She is also had arterial studies on 03/29/15. This showed a right ABI of 1.4 that was noncompressible. Her left ABI was 0.73. 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 It was not felt that arterial inflow was adequate for healing of a foot ulcer. Her Doppler waveforms looked monophasic ===== READMISSION 02/28/17; this is in an now 50 year old woman we've had at several different occasions in this clinic. 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 She has a remote  ex-smoker. She was last seen in this clinic by Dr. Con Memos I think in May. More recently she is been followed by her podiatrist [name] an infectious disease Dr. Megan Salon. She has 2 open wounds the major one is over the right first metatarsal head she also has a wound on the left plantar foot. an MRI of the right foot on 01/01/17 showed a soft tissue ulcer along the plantar aspect of the first metatarsal base consistent with osteomyelitis of the first metatarsal stump. Dr. 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 According to the patient this is been open for slightly over a month. She has been on a combination of Cipro 500 twice a day, Zyvox 600 twice a day and Flagyl 500 3 times a day for over a month now as directed by Dr. Megan Salon. cultures of the right foot earlier this year showed MRSA in January and Serratia in May. January also had a few viridans strep. Recent x-rays of both feet were done and [name] office and I don't have these reports. The patient has known PAD and has a history of aleft femoropopliteal bypass in April 2017. She underwent a right TMA in June 2016 and a left fifth ray amputation in April 2017 the patient has an  insulin pump and she works closely with her endocrinologist Dr. Dwyane Dee. In spite of this the last hemoglobin A1c I can see is 10.1 on 01/01/2017. She is being referred by 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 She is been using Medihoney to this area and also an area on the left midfoot. She is using healing sandals bilaterally. ABIs in this clinic at the left posterior tibial was 1.1 noncompressible on the right READMISSION Non invasive vascular NOVANT 5/18 Aftercare following surgery of the circulatory system Procedure Note - Interface, External Ris In - 08/13/2016 11:05 AM EDT Procedure: Examination consists of physiologic resting arterial pressures of the brachial and ankle arteries bilaterally with continuous wave Doppler waveform analysis. Previous: Previous exam performed on 02/14/16 demonstrated ABIs of Rt = 1.19 and Lt = 1.33. Right: ABI = non-compressible PT 1.47 DP. S/P transmet amputation. , Left: ABI = 1.52, 2nd digit pressure = 87 mmHg Conclusions: Right: ABI (>1.3) may be falsely elevated, suggesting medial calcification. Left: ABI (>1.3) may be falsely elevated, suggesting medial calcification The patient is a now 50 year old type I diabetic is had multiple issues her graded to chronic diabetic foot ulcers. 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 We had her in the clinic lastin November. At that point she had wounds on her bilateral feet.she had wanted to try hyperbarics however the healogics review process denied her because she hadn't followed up with her vascular surgeon for her left femoropopliteal bypass. The bypass was done by Dr. Raul Del at [facility]. We made her a follow-up with Dr. Raul Del however she did not keep the appointment and therefore she was not  approved The patient shows me a small wound on her left fourth metatarsal head on her phone. She developed rapid discoloration in the plantar aspect of the left foot and she was admitted to hospital from 2/2 through 05/10/17 with wet gangrene of the left foot osteomyelitis of the fourth metatarsal heads. She was admitted acutely ill with a temperature of 103. She was  started on broad-spectrum vancomycin and cefepime. On 05/06/17 she was taken to the OR by [name] her podiatric surgeon for an incision and drainage irrigation of the left foot wound. Cultures from this surgery revealed group be strep and anaerobes. she was seen by Dr.Xu of orthopedic surgery and scheduled for a below-knee amputation which she u refused. Ultimately she was discharged on Levaquin and Flagyl for one month. MRI 05/05/17 done while she was in the hospital showed abscess adjacent to the fourth metatarsal head and neck small abscess around the fourth flexor tendon. Inflammatory phlegmon and gas in the soft tissues along the lateral aspect of the fourth phalanx. Findings worrisome for osteomyelitis involving the fourth proximal and middle phalanx and also the third and fourth metatarsals. Finally the patient had actually shortly before this followed up with Dr. Raul Del at no time on 04/29/17. He felt that her left femoropopliteal bypass was patent he felt that her left-sided toe pressures more than adequate for healing a wound on the left foot. This was before her acute presentation. 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 05/28/17; she is started hyperbarics. The patient tells me that for some reason she was not actually on Levaquin but I think on ciprofloxacin. She was on Flagyl. She only started her Levaquin yesterday due to some difficulty with the pharmacy and perhaps her sister picking it up. She has an appointment with [name] tomorrow and with infectious disease early next week. She has no new complaints 06/06/17; the  patient continues in hyperbarics. She saw [name] on 05/29/17 who is her podiatric surgeon. He is elected for a transmetatarsal amputation on 06/27/17. I'm not sure at what level he plans to do this amputation. The patient is unaware ooShe also saw Dr. Megan Salon of infectious disease who elected to continue her on current antibiotics I think this is ciprofloxacin and Flagyl. I'll need to clarify with her tomorrow if she actually has this. We're using silver alginate to the actual wound. Necrotic surface today with material under the flap of her foot. ooOriginal MRI showed abscesses as well as osteomyelitis of the proximal and middle fourth phalanx and the third and fourth metatarsal heads 06/11/17; patient continues in hyperbarics and continues on oral antibiotics. She is doing well. The wound looks better. The necrotic part of this under the flap in her superior foot also looks better. she is been to see [name]. I haven't had a chance to look at his note. Apparently he has put the transmetatarsal amputation on hold her request it is still planning to take her to the OR for debridement and product application ACEL. I'll see if I can find his note. I'll therefore leave product ordering/requests to [name] for now. I was going to look at Dermagraft 06/18/17-she is here in follow-up evaluation for bilateral foot wounds. She continues with hyperbaric therapy. She states she has been applying manuka honey to the right plantar foot and alternate manuka honey and silver alginate to the left foot, despite our orders. We will continue with same treatment plan and she will follo up next week. 06/25/17; I have reviewed [name] last note from 3/11. She has operative debridement in 2 days' time. By review his note apparently they're going to place there is skin over the majority of this wound which is a good choice. She has a small satellite area at the most proximal part of this wound on the left  plantar foot. The area on the right plantar foot  we've been using silver alginate and it is close to healing. 07/02/17; unfortunately the patient was not easily approved for [name] proposed surgery. I'm not completely certain what the issue is. She has been using silver alginate to the wound she has completed a first course of hyperbarics. She is still on Levaquin and Flagyl. I have really lost track of the time course here.I suspect she should have another week to 2 of antibiotics. I'll need to see if she is followed up with infectious disease Dr. Megan Salon 07/09/17; the patient is followed up with Dr. Megan Salon. 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 She continues on Levaquin and metronidazole continuing both of these for now I think she is been on fr about 6 weeks. She still has some drainage but no pain. No fever. Her had been plans for her to go to the OR for operative debridement with her podiatrist [name], I am not exactly sure where that is. I'll probably slip a note to [name] today. I note that she follows with Dr. Dwyane Dee of endocrinology. We have her recertified for hyperbaric oxygen. I have not heard about Dermagraft however I'll see if [name] is planning a skin substitute as well 07/16/17; the patient tells me she is just about out of [place]. I'll need to check Dr. Hale Bogus last notes on this. She states she has plenty of Flagyl however. She comes in today complaining of pain in the right lateral foot which she said lasted for about a day. The wound on the right foot is actually much more medially. She also tells me that the [facility] cost a lot of pain in the left foot wound and she turned back to silver alginate. Finally Dermagraft has a $786 per application co-pay. She cannot afford this 07/23/17; patient arrives today with the wound not much smaller. There is not much new to add. She has not heard from [name] all try to put  in a call to them today. She was asking about Dermagraft again and she has an over $767 per application co-pay she states that she would be willing to try to do a payment plan. I been tried to avoid this. We've been using silver alginate, I'll change to [facility] 07/30/17-She is here in follow-up evaluation for left foot ulcer. She continues hyperbaric medicine. The left foot ulcer is stable we will continue with same treatment plan 08/06/17; she is here for evaluation of her left foot ulcer. Currently being treated for hyperbarics or underlying osteomyelitis. She is completed antibiotics. The left foot ulcer is better smaller with healthier looking granulation. For various reasons I am not really clear on we never got her back to the OR with [name]. He did not respond to my secure text message. Nevertheless I think that surgery on this point is not necessary nor am I completely clear that a skin substitute is necessary The patient is complaining about pain on the outside of her right foot. 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 There is no erythema. She also states the foot is warm versus her other part of her upper leg and this is largely true. It is not totally clear to me what's causing this. She thinks it's different from her usual neuropathy pain 08/13/17; she arrives in clinic today with a small wound which is superficial on her right first metatarsal head. 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  She tells [place] she was up on her feet over the Mother's Day celebration. ooThe large wound is on the left foot. Continues with hyperbarics for underlying osteomyelitis. We're using Hydrofera Blue. She asked me today about where we were with Dermagraft. I had actually excluded this because of the co-pay however she wants to assume this therefore I'll recheck the co-pay an order for next week. 08/20/17; the patient agreed to accept the co-pay of the first Apligraf  which we applied today. She is disappointed she is finishing hyperbarics will run this through the insurance on the extent of the foot infection and the extent of the wound that she had however she is already had 60 dive's. Dermagraft No. 1 08/27/17; Dermagraft No. 2. She is not eligible for any more hyperbaric treatments this month. She reports a fair amount of drainage and she actually changed to the external dressings without disturbing the direct contact layer 09/03/17; the patient arrived in clinic today with the wound superficially looking quite healthy. Nice vibrant red tissue with some advancing epithelialization although not as much adherence of the flap as I might like. However she noted on her own fourth toe some bogginess and she brought that to our attention. Indeed this was boggy feeling like a possibility of subcutaneous fluid. 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 She is not been unwell. We've been using Dermagraft 09/10/17; the culture that I did not last week was MRSA. She saw Dr. Megan Salon this morning who is going to start her on vancomycin. I had sent him a secure a text message yesterday. I also spoke with her podiatric surgeon [name] about surgery on this foot the options for conserving a functional foot etc. Promised me he would see her and will make back consultation today. Paradoxically her actual wound on the plantar aspect of her left foot looks really quite good. I had given her 5 days worth of Baxdella to cover her for MRSA. Her MRI came back showing osteomyelitis within the third metatarsal shaft and head and base of the third and fourth proximal phalanx. She had extensive inflammatory changes throughout the soft tissue of the lateral forefoot. With an ill-defined fluid around the fourth metatarsal extending into the plantar and dorsal soft tissues 09/19/17; the patient is actually on oral Septra  and Flagyl. She apparently refused IV vancomycin. She also saw [name] at my request who is planning her for a left BKA sometime in mid July. MRI showed osteomyelitis within the third metatarsal shaft and head and the basis of the third and fourth proximal phalanx. I believe there was felt to be possible septic arthritis involving the third MTP. 09/26/17; the patient went back to Dr. Megan Salon at my suggestion and is now receiving IV daptomycin. Her wound continues to look quite good making the decision to proceed with a transmetatarsal amputation although more difficult for the patient. I believe in my extensive discussions with her she has a good sense of the pros and cons of this. I don't NV the tuft decision she has to make. She has an appointment with [name] I believe in mid July and I previously spoken to him about this issue Has we had used 3 previous Dermagraft. Given the condition of the wound surface I went ahead and added the fourth one today area and I did this not fully realizing that she'll be traveling to [place]  next week. I'm hopeful she can come back in 2 weeks 10/21/17; Her same Dermagraft on for about 3-1/2 weeks. In spite of this the wound arrives looking quite healthy. There is been a lot of healing dimensions are smaller. Looking at the square shaped wound she has now there is some undermining and some depth medially under the undermining although I cannot palpate any bone. No surrounding infection is obvious. She has difficult questions about how to look at this going forward vis--vis amputations versus continued medical therapy. T be truthful the wound is looks so o healthy and it is continued to contract. Hard to justify foot surgery at this point although I still told her that I think it might come to that if we are not able to eradicate the underlying MRSA. She is still highly at risk and she understands this 11/06/17 on evaluation today patient appears to be doing  better in regard to her foot ulcer. She's been tolerating the dressing changes without complication. Currently she is here for her Dermagraft #6. Her wound continues to make excellent progress at this point. She does not appear to have any evidence of infection which is good news. 11/13/17 on evaluation today patient appears to be doing excellent at this time. She is here for repeat Dermagraft application. This is #7. Overall her wound seems to be making great progress. 12/05/17; the patient arrives with the wound in much better condition than when I last saw this almost 6 weeks ago. She still has a small probing area in the left metatarsal head region on the lateral aspect of her foot. We applied her last Dermagraft today. ooSince the last time she is here she has what appears to a been a blood blister on the plantar aspect of left foot although I don't see this is threatening. There is also a thick raised tissue on the right mid metatarsal head region. This was not there I don't think the last time she was here 3 weeks ago. 12/12/17; the patient continues to have a small programming area in the left metatarsal head region on the lateral aspect of her foot which was the initial large surgical wound. I applied her last Apligraf last week. I'm going to use Endoform starting today ooUnfortunately she has an excoriated area in the left mid foot and the right mid foot. The left midfoot looks like a blistered area this was not opened last week it certainly is open today. Using silver alginate on these areas. She promises me she is offloading this. 12/19/17; the small probing area in the left metatarsal head eyes think is shallower. In general her original wound looks better. We've been using Endoform. The area inferiorly that I think was trauma last week still requires debridement a lot of nonviable surface which I removed. She still has an open open area distally in her foot ooSimilarly on the right foot  there is tightly adherent surface debris which I removed. Still areas that don't look completely epithelialized. This is a small open area. We used silver alginate on these areas 12/26/2017; the patient did not have the supplies we ordered from last week including the Endoform. The original large wound on the left lateral foot looks healthy. She still has the undermining area that is largely unchanged from last week. She has the same heavily callused raised edged wounds on the right mid and left midfoot. Both of these requiring debridement. We have been using silver alginate on these areas 01/02/2018; there is  still supply issues. We are going to try to use Prisma but I am not sure she actually got it from what she is saying. She has a new open area on the lateral aspect of the left fourth toe [previous fifth ray amputation]. Still the one tunneling area over the fourth metatarsal head. The area is in the midfoot bilaterally still have thick callus around them. She is concerned about a raised swelling on the lateral aspect of the foot. However she is completely insensate 01/10/2018; we are using Prisma to the wounds on her bilateral feet. Surprisingly the tunneling area over the left fourth metatarsal head that was part of her original surgery has closed down. She has a small open area remaining on the incision line. 2 open areas in the midfoot. 02/10/2018; the patient arrives back in clinic after a month hiatus. She was traveling to visit family in [place]. Is fairly clear she was not offloading the areas on her feet. The original wound over the left lateral foot at the level of metatarsal heads is reopened and probes medially by about a centimeter or 2. She notes that a week ago she had purulent drainage come out of an area on the left midfoot. Paradoxically the worst area is actually on the right foot is extensive with purulent drainage. We will use silver alginate today 02/17/2018; the patient has  3 wounds one over the left lateral foot. She still has a small area over the metatarsal heads which is the remnant of her original surgical wound. This has medial probing depth of roughly 1.4 cm somewhat better than last week. The area on the right foot is larger. We have been using silver alginate to all areas. The area on the right foot and left foot that we cultured last week showed both Klebsiella and Proteus. Both of these are quinolone sensitive. The patient put her's self on Bactrim and Flagyl that she had left hanging around from prior antibiotic usages. She was apparently on this last week when she arrived. I did not realize this. Unfortunately the Bactrim will not cover either 1 of these organisms. We will send in Cipro 500 twice daily for a week 03/04/2018; the patient has 2 wounds on the left foot one is the original wound which was a surgical wound for a deep DFU. At one point this had exposed bone. She still has an area over the fourth metatarsal head that probes about 1.4 cm although I think this is better than last week. I been using silver nitrate to try and promote tissue adherence and been using silver alginate here. ooShe also has an area in the left midfoot. This has some depth but a small linear wound. Still requiring debridement. ooOn the right midfoot is a circular wound. A lot of thick callus around this area. ooWe have been using silver alginate to all wound areas ooShe is completed the ciprofloxacin I gave her 2 weeks ago. 03/11/2018; the patient continues to have 2 open areas on the left foot 1 of which was the original surgical wound for a deep DFU. Only a small probing area remains although this is not much different from last week we have been using silver alginate. The other area is on the midfoot this is smaller linear but still with some depth. We have been using silver alginate here as well ooOn the right foot she has a small circular wound in the mid aspect.  This is not much smaller than last time. We  have been using silver alginate here as well 03/18/2018; she has 3 wounds on the left foot the original surgical wound, a very superficial wound in the mid aspect and then finally the area in the mid plantar foot. She arrives in today with a very concerning area in the wound in the mid plantar foot which is her most proximal wound. There is undermining here of roughly 1-1/2 cm superiorly. Serosanguineous drainage. She tells me she had some pain on for over the weekend that shot up her foot into her thigh and she tells me that she had a nodule in the groin area. ooShe has the single wound in the right foot. ooWe are using endoform to both wound areas 03/24/2018; the patient arrives with the original surgical wound in the area on the left midfoot about the same as last week. There is a collection of fluid under the surface of the skin extending from the surgical wound towards the midfoot although it does not reach the midfoot wound. The area on the right foot is about the same. Cultures from last week of the left midfoot wound showed abundant Klebsiella abundant Enterococcus faecalis and moderate methicillin resistant staph I gave her Levaquin but this would have only covered the Klebsiella. She will need linezolid 04/01/2018; she is taking linezolid but for the first few days only took 1 a day. I have advised her to finish this at twice daily dosing. In any case all of her wounds are a lot better especially on the left foot. The original surgical wound is closed. The area on the left midfoot considerably smaller. The area on the right foot also smaller. 04/08/2018; her original surgical wound/osteomyelitis on the left foot remains closed. She has area on the left foot that is in the midfoot area but she had some streaking towards this. This is not connected with her original wound at least not visually. ooSmall wound on the right midfoot appears somewhat  smaller. 04/15/18; both wounds looks better. Original wound is better left midfoot. Using silver alginate 1/21; patient states she uses saltwater soak in, stones or remove callus from around her wounds. She is also concerned about a blood blister she had on the left foot but it simply resolved on its own. We've been using silver alginate 1/28; the patient arrives today with the same streaking area from her metatarsals laterally [the site of her original surgical wound] down to the middle of her foot. There is some drainage in the subcutaneous area here. This concerns me that there is actually continued ongoing infection in the metatarsals probably the fourth and third. This fixates an MRI of the foot without contrast [chronic renal failure] ooThe wound in the mid part of the foot is small but I wonder whether this area actually connects with the more distal foot. ooThe area on the right midfoot is probably about the same. Callus thick skin around the small wound which I removed with a curette we have been using silver alginate on both wound areas 2/4; culture I did of the draining site on the left foot last time grew methicillin sensitive staph aureus. MRI of the left foot showed interval resolution of the findings surrounding the third metatarsal joint on the prior study consistent with treated osteomyelitis. Chronic soft tissue ulceration in the plantar and lateral aspect of the forefoot without residual focal fluid collection. No evidence of recurrent osteomyelitis. Noted to have the previous amputation of the distal first phalanx and fifth ray MRI of  the right foot showed no evidence of osteomyelitis I am going to treat the patient with a prolonged course of antibiotics directed against MSSA in the left foot 2/11; patient continues on cephalexin. She tells me she had nausea and vomiting over the weekend and missed 2 days. In general her foot looks much the same. She has a small open area just  below the left fourth metatarsal head. A linear area in the left midfoot. Some discoloration extending from the inferior part of this into the left lateral foot although this appears to be superficial. She has a small area on the right midfoot which generally looks smaller after debridement 2/18; the patient is completing his cephalexin and has another 2 days. She continues to have open areas on the left and right foot. 2/25; she is now off antibiotics. The area on the left foot at the site of her original surgical wound has closed yet again. She still has open areas in the mid part of her foot however these appear smaller. The area on the right mid foot looks about the same. We have been using silver alginate She tells me she had a serious hypoglycemic spell at home. She had to have EMS called and get IV dextrose 3/3; disappointing on the left lateral foot large area of necrotic tissue surrounding the linear area. This appears to track up towards the same original surgical wound. Required extensive debridement. The area on the right plantar foot is not a lot better also using silver 3/12; the culture I did last time showed abundant enterococcus. I have prescribed Augmentin, should cover any unrecognized anaerobes as well. In addition there were a few MRSA and Serratia that would not be well covered although I did not want to give her multiple antibiotics. She comes in today with a new wound in the right midfoot this is not connected with the original wound over her MTP a lot of thick callus tissue around both wounds but once again she said she is not walking on these areas 3/17-Patient comes in for follow-up on the bilateral plantar wounds, the right midfoot and the left plantar wound. Both these are heavily callused surrounding the wounds. We are continuing to use silver alginate, she is compliant with offloading and states she uses a wheelchair fairly often at home 3/24; both wound areas have thick  callus. However things actually look quite a bit better here for the majority of her left foot and the right foot. 3/31; patient continues to have thick callused somewhat irritated looking tissue around the wounds which individually are fairly superficial. There is no evidence of surrounding infection. We have been using silver alginate however I change that to [facility] today 4/17; patient returns to clinic after having a scare with Covid she tested negative in her primary doctor's office. She has been using Hydrofera Blue. She does not have an open area on the right foot. On the left foot she has a small open area with the mid area not completely viable. She showed me pictures of what looks like a hemorrhagic blister from several days ago but that seems to have healed over this was on the lateral left foot 4/21; patient comes in to clinic with both her wounds on her feet closed. However over the weekend she started having pain in her right foot and leg up into the thigh. She felt as though she was running a low-grade fever but did not take her temperature. She took a doxycycline that  she had leftover and yesterday a single Septra and metronidazole. She thinks things feel somewhat better. 4/28; duplex ultrasound I ordered last week was negative for DVT or superficial thrombophlebitis. She is completed the doxycycline I gave her. States she is still having a lot of pain in the right calf and right ankle which is no better than last week. She cannot sleep. She also states she has a temperature of up to 101, coughing and complaining of visual loss in her bilateral eyes. Apparently she was tested for Covid 2 weeks ago at [facility] and that was negative. Readmission: 09/03/18 patient presents back for reevaluation after having been evaluated at the end of April regarding erythema and swelling of her right lower extremity. Subsequently she ended up going to the hospital on 07/29/18 and was admitted not to be  discharged until 08/08/18. Unfortunately it was noted during the time that she was in the hospital that she did have methicillin-resistant Staphylococcus aureus as the infection noted at the site. It was also determined that she did have osteomyelitis which appears to be fairly significant. She was treated with vancomycin and in fact is still on IV vancomycin at dialysis currently. This is actually slated to continue until 09/12/18 at least which will be the completion of the six weeks of therapy. Nonetheless based on what I'm seeing at this point I'm not sure she will be anywhere near ready to discontinue antibiotics at that time. Since she was released from the hospital she was seen by [name] who is her podiatrist on 08/27/18. His note specifically states that he is recommended that the patient needs of one knee amputation on the right as she has a life- threatening situation that can lead quickly to sepsis. The patient advised she would like to try to save her leg to which [name] apparently told her that this was against all medical advice. She also want to discontinue the Wound VAC which had been initiated due to the fact that she wasn't pleased with how the wound was looking and subsequently she wanted to pursue applying Medihoney at that time. He stated that he did not believe that the right lower extremity was salvageable and that the patient understood but would still like to attempt hyperbaric option therapy if it could be of any benefit. She was therefore referred back to [place] for further evaluation. He plans to see her back next week. Upon inspection today patient has a significant amount purulent drainage noted from the wound at this point. The bone in the distal portion of her foot also appears to be extremely necrotic and spongy. When I push down on the bone it bubbles and seeps purulent drainage from deeper in the end of the foot. I do not think that this is likely going to heal very well  at all and less aggressive surgical debridement were undertaken more than what I believe we can likely do here in our office. 09/12/2018; I have not seen this patient since the most recent hospitalization although she was in our clinic last week. I have reviewed some of her records from a complex hospitalization. 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 There is situation was complicated by MRSA bacteremia and acute on chronic renal failure now on dialysis. She is receiving vancomycin at dialysis. We started her on Dakin's wet-to-dry last week she is changing this daily. There is still purulent drainage coming out of her foot. Although she  is apparently "agreeable" to a below-knee amputation which is been suggested by multiple clinicians she wants this to be done in [place]. She apparently has a telehealth visit with that provider sometime in late Mam[place] 6/24. I have told her I think this is probably too long. Nevertheless I could not convince her to allow a local doctor to perform BKA. 09/19/2018; the patient has a large necrotic area on the right anterior foot. She has had previous transmetatarsal amputations. Culture I did last week showed MRSA nothing else she is on vancomycin at dialysis. She has continued leaking purulent drainage out of the distal part of the large circular wound on the right anterior foot. She apparently went to see Dr. 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 Somehow that translated into her being referred to plastic surgery for debridement of the area. I gather she basically refused amputation although I do not have a copy of Dr. Berenice Primas notes. The patient really wants to have a trial of hyperbaric oxygen. I agreed with initial assessment in this clinic that this was probably too far along to benefit however if she is going to have plastic surgery I think she would benefit from ancillary  hyperbaric oxygen. The issue here is that the patient has benefited as maximally as any patient I have ever seen from hyperbaric oxygen therapy. Most recently she had exposed bone on the lateral part of her left foot after a surgical procedure and that actually has closed. She has eschared areas in both heels but no open area. She is remained systemically well. I am not optimistic that anything can be done about this but the patient is very clear that she wants an attempt. The attempt would include a wound VAC further debridements and hyperbaric oxygen along with IV antibiotics. 6/26; I put her in for a trial of hyperbaric oxygen only because of the dramatic response she has had with wounds on her left midfoot earlier this year which was a surgical wound that went straight to her bone over the metatarsal heads and also remotely the left third toe. We will see if we can get this through our review process and insurance. She arrives in clinic with again purulent material pouring out of necrotic bone on the top of the foot distally. There is also some concerning erythema on the front of the leg that we marked. It is bit difficult to tell how tender this is because of neuropathy. I note from infectious disease that she had her vancomycin extended. 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 She had the wound VAC on for part of the week. The rest of the time she is putting various things on this including Medihoney, "ionized water" silver sorb gel etc. 7/7; follow-up along with HBO. She is still on vancomycin at dialysis. She has a large open area on the dorsal right foot and a small dark eschar area on her heel. There is a lot less erythema in the area and a lot less tenderness. From an infection point of view I think this is better. She still has a lot of necrosis in the remaining right forefoot [previous TMA] we are still using the wound VAC in this area 7/16; follow-up  along with HBO. I put her on linezolid after she finished her vancomycin. We started this last Friday I gave her 2 weeks worth. I had the expectation that she would be operatively debrided by Dr. Marla Roe but that  still has not happened yet. Patient phoned the office this week. She arrives for review today after HBO. The distal part of this wound is completely necrotic. Nonviable pieces of tendon bone was still purulent drainage. Also concerning that she has black eschar over the heel that is expanding. I think this may be indicative of infection in this area as well. She has less erythema and warmth in the ankle and calf but still an abnormal exam 7/21 follow-up along with HBO. I will renew her linezolid after checking a CBC with differential monitoring her blood counts especially her platelets. She was supposed to have surgery yesterday but if I am reading things correctly this was canceled after her blood sugar was found to be over 500. I thought Dr. Marla Roe who called me said that they were sending her to the ER but the patient states that was not the case. 7/28. Follow-up along with HBO. She is on linezolid I still do not have any lab work from dialysis even though I called last week. The patient is concerned about an area on her left lateral foot about the level of the base of her fifth metatarsal. I did not really see anything that ominous here however this patient is in [place] ability to point out problems that she is sensing and she has been accurate in the past Finally she received a call from Dr. Marla Roe who is referring her to another orthopedic surgeon stating that she is too booked up to take her to the operating room now. Was still using a wound VAC on the foot 8/3 -Follow-up after HBO, she is got another week of linezolid, she is to call [facility] for an appointment, x-rays of both feet were reviewed, the left foot x-ray with third MTP joint osteo- Right foot x-ray widespread osteo-in  the right midfoot Right ankle x-ray does not show any active evidence of infection 8/11-Patient is seen after HBO, the wounds on the right foot appear to be about the same, the heel wound had some necrotic base over tendon that was debrided with a curette 8/21; patient is seen after HBO. The patient's wound on her dorsal foot actually looks reasonably good and there is substantial amount of epithelialization however the open area distally still has a lot of necrotic debris partially bone. I cannot really get a good sense of just how deep this probes under the foot. She has been pressuring me this week to order medical maggots through a company in [place] for her. The problem I have is there is not a defined wound area here. On the positive side there is no purulence. She has been to see infectious disease she is still on Septra DS although I have not had a chance to review their notes 8/28; patient is seen in conjunction with HBO. The wounds on her foot continued to improve including the right dorsal foot substantially the, the distal part of this wound and the area on the right heel. We have been using a wound VAC over this chronically. She is still on trimethoprim as directed by infectious disease 9/4; patient is seen in conjunction with HBO. Right dorsal foot wound substantially anteriorly is better however she continues to have a deep wound in the distal part of this that is not responding. We have been using silver collagen under border foam ooArea on the right plantar medial heel seems better. We have been using Hydrofera Blue 12/12/18 on evaluation today patient appears to be doing about the  same with regard to her wound based on prior measurements. She does have some necrotic tissue noted on the lateral aspect of the wound that is going require a little bit of sharp debridement today. This includes what appears to be potentially either severely necrotic bone or tendon. Nonetheless other  than that she does not appear to have any severe infection which is good news 9/18; it is been 2 weeks since I saw this wound. She is tolerating HBO well. Continued dramatic improvement in the area on the right dorsal foot. She still has a small wound on the heel that we have been using Hydrofera Blue. She continues with a wound VAC 9/24; patient has to be seen emergently today with a swelling on her right lateral lower leg. She says that she told Dr. Evette Doffing about this and also myself on a couple of occasions but I really have no recollection of this. She is not systemically unwell and her wound really looked good the last time I saw this. She showed this to providers at dialysis and she was able to verify that she was started on cephalexin today for 5 doses at dialysis. She dialyzes on Tuesday Thursday and Saturday. 10/2; patient is seen in conjunction with HBO. The area that is draining on the right anterior medial tibia is more extensive. Copious amounts of serosanguineous drainage with some purulence. We are still using the wound VAC on the original wound then it is stable. Culture I did of the original IandD showed MRSA I contacted dialysis she is now on vancomycin with dialysis treatments. I asked them to run a month 10/9; patient seen in conjunction with HBO. She had a new spontaneous open area just above the wound on the right medial tibia ankle. More swelling on the right medial tibia. Her wound on the foot looks about the same perhaps slightly better. There is no warmth spreading up her leg but no obvious erythema. her MRI of the foot and ankle and distal tib-fib is not booked for next Friday I discussed this with her in great detail over multiple days. it is likely she has spreading infection upper leg at least involving the distal 25% above the ankle. She knows that if I refer her to orthopedics for infectious disease they are going to recommend amputation and indeed I am not against this  myself. We had a good trial at trying to heal the foot which is what she wanted along with antibiotics debridement and HBO however she clearly has spreading infection [probably staph aureus/MRSA]. Nevertheless she once again tells me she wants to wait the left of the MRI. She still makes comments about having her amputation done in [place]. 10/19; arrives today with significant swelling on the lateral right leg. Last culture I did showed Klebsiella. Multidrug-resistant. Cipro was intermediate sensitivity and that is what I have her on pending her MRI which apparently is going to be done on Thursday this week although this seems to be moving back and forth. She is not systemically unwell. We are using silver alginate on her major wound area on the right medial foot and the draining areas on the right lateral lower leg 10/26; MRI showed extensive abscess in the anterior compartment of the right leg also widespread osteomyelitis involving osseous structures of the midfoot and portions of the hindfoot. Also suspicion for osteomyelitis anterior aspect of the distal medial malleolus. Culture I did of the purulence once again showed a multidrug-resistant Klebsiella. I have been in contact  with nephrology late last week and she has been started on cefepime at dialysis to replace the vancomycin We sent a copy of her MRI report to Dr. Geroge Baseman in [place] who is an orthopedic surgeon. The patient takes great stock in his opinion on this. She says she will go to [place] to have her leg amputated if Dr. Geroge Baseman does not feel there is any salvage options. 11/2; she still is not talk to her orthopedic surgeon in [place]. Apparently he will call her at [phone] this afternoon. The quality of this is she has not allowed me to refer her anywhere. She has been told over and over that she needs this amputated but has not agreed to be referred. She tells me her blood sugar was 600 last night but she has not been  febrile. 11/9; she never did got a call from the orthopedic surgeon in [place] therefore that is off the radar. We have arranged to get her see orthopedic surgery at [facility]. She still has a lot of draining purulence coming out of the new abscess in her right leg although that probably came from the osteomyelitis in her right foot and heel. Meanwhile the original wound on the right foot looks very healthy. Continued improvement. The issue is that the last MRI showed osteomyelitis in her right foot extensively she now has an abscess in the right anterior lower leg. There is nobody in [place] who will offer this woman anything but an amputation and to be honest that is probably what she needs. I think she still wants to talk about limb salvage although at this point I just do not see that. She has completed her vancomycin at dialysis which was for the original staph aureus she is still on cefepime for the more recent Klebsiella. She has had a long course of both of these antibiotics which should have benefited the osteomyelitis on the right foot as well as the abscess. 11/16; apparently [place] elective surgery is shut down because of COVID-19 pandemic. I have reached out to some contacts at [facility] to see if we can get her an orthopedic appointment there. I am concerned about continually leaving this but for the moment everything is static. In fact her original large wound on this foot is closing down. It is the abscess on the right anterior leg that continues to drain purulent serosanguineous material. She is not currently on any antibiotics however she had a prolonged course of vancomycin [1 month] as well as cefepime for a month 02/24/2019 on evaluation today patient appears to be doing better than the last time I saw her. This is not a patient that I typically see. With that being said I am covering for Dr. Dellia Nims this week and again compared to when I last saw her overall the wounds in  particular seem to be doing significantly better which is good news. With that being said the patient tells me several disconcerting things. She has not been able to get in to see anyone for potential debridement in regard to her leg wounds although she tells me that she does not think it is necessary any longer because she is taking care of that herself. She noticed a string coming out of the lower wound on her leg over the last week. The patient states that she subsequently decided that we must of pack something in there and started pulling the string out and as it kept coming and coming she realized this was likely her tendon. With  that being said she continued to remove as much of this as she could. She then I subsequently proceeded to using tubes of antibiotic ointment which she will stick down into the wound and then scored as much as she can until she sees it coming out of the other wound opening. She states that in doing this she is actually made things better and there is less redness and irritation. With regard to her foot wound she does have some necrotic tendon and tissue noted in one small corner but again the actual wound itself seems to be doing better with good granulation in general compared to my last evaluation. 12/7; continued improvement in the wound on the substantial part of the right medial foot. Still a necrotic area inferiorly that required debridement but the rest of this looks very healthy and is contracting. She has 2 wounds on the right lateral leg which were her original drainage sites from her abscess but all of this looks a lot better as well. She has been using silver alginate after putting antibiotic biotic ointment in one wound and watching it come out the other. I have talked to her in some detail today. I had given her names of orthopedic surgeons at [facility] for second opinion on what to do about the right leg. I do not think the patient never called them. She has  not been able to get a hold of the orthopedic surgeon in [place] that she had put a lot of faith in as being somebody would give her an opinion that she would trust. I talked to her today and said even if I could get her in to another orthopedic surgeon about the leg which she accept an amputation and she said she would not therefore I am not going to press this issue for the moment 12/14; continued improvement in his substantial wound on the right medial foot. There is still a necrotic area inferiorly with tightly adherent necrotic debris which I have been working on debriding each time she is here. She does not have an orthopedic appointment. Since last time she was here I looked over her cultures which were essentially MRSA on the foot wound and gram-negative rods in the abscess on the anterior leg. 12/21; continued improvement in the area on the right medial foot. She is not up on this much and that is probably a good thing since I do not know it could support continuous ambulation. She has a small area on the right lateral leg which were remanence of the IandD's I did because of the abscess. I think she should probably have prophylactic antibiotics I am going to have to look this over to see if we can make an intelligent decision here. In the meantime her major wound is come down nicely. Necrotic area inferiorly is still there but looks a lot better 04/06/2019; she has had some improvement in the overall surface area on the right medial foot somewhat narrowedr both but somewhat longer. The areas on the right lateral leg which were initial IandD sites are superficial. Nothing is present on the right heel. We are using silver alginate to the wound areas 1/18; right medial foot somewhat smaller. Still a deep probing area in the most distal recess of the wound. She has nothing open on the right leg. She has a new wound on the plantar aspect of her left fourth toe which may have come from just  pulling skin. The patient using Medihoney on the wound  on her foot under silver alginate. I cannot discourage her from this 2/1; 2-week follow-up using silver alginate on the right foot and her left fourth toe. The area on the right dorsal foot is contracted although there is still the deep area in the most distal part of the wound but still has some probing depth. No overt infection 2/15; 2-week follow-up. She continues to have improvement in the surface area on the dorsal right foot. Even the tunneling area from last time is almost closed. The area that was on the plantar part of her left fourth toe over the PIP is indeed closed 3/1; 2-week follow-up. Continued improvement in surface area. The original divot that we have been debriding inferiorly I think has full epithelialization although the epithelialization is gone down into the wound with probably 4 mm of depth. Even under intense illumination I am unable to see anything open here. The remanence of the wound in this area actually look quite healthy. We have been using silver alginate 3/15; 2-week follow-up. Unfortunately not as good today. She has a comma shaped wound on the dorsal foot however the upper part of this is larger. Under illumination debris on the surface She also tells [place] that she was on her right leg 2 times in the last couple of weeks mostly to reach up for things above her head etc. She felt a sharp pain in the right leg which she thinks is somewhere from the ankle to the knee. The patient has neuropathy and is really uncertain. She cannot feel her foot so she does not think it was coming from there 3/29; 2-week follow-up. Her wound measures smaller. Surface of the wound appears reasonable. She is using silver alginate with underlying Medihoney. She has home health. X-rays I did of her tib-fib last time were negative although it did show arterial calcification 4/12; 2-week follow-up. Her wound measures smaller in length. Using  manuka honey with silver alginate on top. She has home health. 4/26; 2-week follow-up. Her wound is smaller but still very adherent debris under illumination requiring debridement she has been using manuka honey with silver alginate. 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 Been using Hydrofera Blue. She is on suppressive Bactrim but apparently she has been taking it twice daily 6/7; I have not seen this wound and about 6 weeks. Since then she was up in [place]. By her own admission she was walking on the foot because she did not have a wheelchair. The wound is not nearly as healthy looking as it was the last time I saw this. We ordered different things for her but she only uses Medihoney and silver alginate. As far as I know she is on suppressive trimethoprim sulfamethoxazole. She does not admit to any fever or chills. Her CBGs apparently are at baseline however she is saying that she feels some discomfort on the lateral part of her ankle I looked over her last inflammatory markers from the summer 2020 at which time she had a deeply necrotic infected wound in this area. On 11/10/2018 her sedimentation rate was 56 and C-reactive protein 9.9. This was 107 and 29 on 07/29/2018. 6/17; the patient had a necrotic wound the last time she was here on the right dorsal foot. After debridement I did a culture. This showed a very resistant ESBL Klebsiella as well as Enterococcus. Her x-ray of the  foot which was done because of warmth and some discomfort showed bone destruction within the carpal bones involving the navicular acute cuboid lateral middle cuneiforms but essentially unchanged from her prior study which was done on 10/29/2018. The findings were felt to represent chronic osteomyelitis. We did inflammatory markers on her. Her white count was 5.25 sedimentation rate 16 and C-reactive protein at 11.1.  Notable for the fact that in August 2020 her CRP was 9.9 and sedimentation rate 56. I have looked at her x-rays. It is true that the bone destruction is very impressive however the patient came into this clinic for the wound on her right foot with pieces of bone literally falling out anteriorly with purulent material. I am not exactly sure I could have expected anything different. She has not been systemically unwell no fever chills or blood sugars have been reasonable. 6/28; she arrives with a right heel closed. The substantial area on the right anterior foot looks healthy. Much better looking surface. I think we can change to [facility] seems to help this previously. She is getting her antibiotics at dialysis she should be just about finished 7/9; changed to [facility] last week. Surface wound looks satisfactory not much change in surface area however. She is going to [place] state next week this is usually a difficult thing for this patient follow-up will be for 2 weeks. 7/23; using Hydrofera Blue. She returns from her trip and the wound looks surprisingly good. Usually when this patient goes on trips she comes back with a lot of problems with the wound. She is saying that she sometimes feels an episodic "crunching" feeling on the lateral part of the foot. She is neuropathic and not feeling pain but wonders whether this could be a neuropathic dysesthesia. 11/13/19-Patient returns after 3 weeks, the wound itself is stable and patient states that there is nothing new going on she is on some extra anxiety medications and is resisting the temptation to pick at the dry skin around the wound. 9/20; patient has not been here in over a month and I have not seen her in 2 months. The wound in terms of size I think is about the same. There is no exposed bone. She has a nonviable surface on this. She is supposed to be using [facility] however she is also been using some form of honey preparation  as well as a silver-based dressings. I do not think she has any pattern to this. 10/4; 2-week follow-up. Patient has been using some form of spray which she says has honey and silver to purchase this online she has been covering it with gauze. In spite of this the wound actually looks quite good. The deeper divot distally appears to be close down. There is a rim of epithelialization. 10/18; 2-week follow-up. Patient has been using her Hydrofera Blue covered with her silver honey spray that she got online. 11/1; 2-week follow-up. She is using Hydrofera Blue with a silver honey spray. Wound bed is measuring smaller. She has noticed that her foot is warmer on the right. She is concerned about infection. For a long period of time I had her on prophylactic trimethoprim sulfamethoxazole DS 1 tablet daily. She is asking for this to be restarted. The patient is walking on this foot because of repairs that are being done in a home her but her room is on the second floor she has to go up and down stairs. I have cautioned against this however as usual she  will do exactly what she wants to do 11/15; 2-week follow-up. She uses Hydrofera Blue with a silver/honey spray which I have never heard of. I think her wound looks about the same. Some epithelialization. No evidence that this is infected. I think she is walking on this more than we agreed on. She is going on extensive vacation over Thanksgiving 12/3; 2-week follow-up. She is using Hydrofera Blue however over Thanksgiving she ran out of this and she is simply been using Medihoney. In spite of this her wound is smaller almost divided into 2 now. She traveled extensively over Thanksgiving and actually looks quite good in spite of this. Usually this is been a marker of problems for her 12/17; 2-week follow-up. She is using Hydrofera Blue. The wound is smaller. Debris on the surface of this is fibrinous. She is traveling to [place] over the holidays which never  bodes well for her wounds. I think she is walking more on her feet then she is even willing to admit and she tells me she does walk 2/11; using Hydrofera Blue. Her wounds are contracting however she walks in the clinic with a history that she has not been able to eat she has had vomiting. She also has right eye problems. Her blood sugar was 567. She had blood work done at dialysis and we called there to get her blood work although it still had not returned although they should have this by the end of the day we were told. She also stated that she was not sure what her blood sugar was as her glucose monitor was not working and not coming to tomorrow. She also for some reason does not think her insulin pump is working well. Her endocrinologist is Dr. Elayne Snare 06/03/2020 upon evaluation today patient actually appears to be doing excellent in regard to her wound. In fact she tells me it was not even bleeding until she picked a dry piece of skin off while we were getting ready to come in and see her today. Fortunately there is no signs of active infection which is great news and overall very pleased. She is going to be seeing her neurologist sometime shortly I believe it might even be on Monday. Nonetheless there can proceed with the work-up as far as anything else going on with her eye the fact that her right eyelid is drooping. 3/25; right medial dorsal foot. She has superficial areas here that appear to be fully epithelialized she is using Medihoney and silver alginate and some combination. ooShe has a new area on the mid left foot at roughly the level of the fifth metatarsal base 4/84/8; right medial dorsal foot. Still superficial areas here. I cleaned up 1 of these today she has been using I think mostly Medihoney but I would like her to use silver alginate. The area on the mid part of her foot also looks improved on the left. Since she was last here she tells me that she noted pus in the area of her  left foot. She told dialysis about this they did a culture and ultimately she is on IV antibiotics but were not sure which one. They referred her to [name] he said that he did not need to follow her. I have not really verified any of this and I do not know what antibiotic she is on at dialysis 4/29; patient has been using Medihoney to her wounds. She reports taking off the toenail to her left second toe. She states  that home health discharged her and she has not had help with dressing changes. She denies any signs and symptoms of infection. 5/13; 2-week follow-up. Miraculously the wound on the right foot dorsally is healed. She has an area on the tip of her left third toe and an area in the left midfoot. 5/26; patient presents for 2-week follow-up. She has 2 open wounds 1 to her plantar foot and the other to the left third toe. She has noticed increased swelling and redness to the toe wound. It is unclear exactly what she is doing for dressing changes as she has multiple dressing options at home. She states that it is easiest to do [facility] on the plantar wound and Prisma or Medihoney on the toe wound. 6/3; saw Dr. Maryjane Hurter week. She put her on Keflex because of the swelling I think of the left second toe. She arrives today with new breakdown x3 on the dorsal right foot which is disappointing. She also complains of pain in the right ankle, 3 liquid stools per day, chills without fever 6/17; patient was in [place]. I think a little more ambulatory than I would like to hear. In any case she still has the 3 small open areas on the dorsal part of her foot on the right medial leg these look about the same as last time. She is supposed to be using Prisma but I think used Medihoney. She has an area on the left lateral plantar foot which had undermining under her skin and a blood blister with the 2 areas communicating today. And finally an area on the tip of her left second toe 6/30; patient has 3  small areas that look better than the last time on the right dorsal foot. She uses a combination of her products including collagen, Hydrofera Blue and the Medihoney nevertheless they look better. The area on the left midfoot might have been healed although she pulled some skin off superficial open area. The left second toe still does not look that healthy. Open areas on the tips of the 7/21; the areas on her right dorsal foot are still present although they look benign. We are using silver collagen although she may be using any of the products she had at home. The real problem here is the left second toe. Necrotic wound over the distal phalanx and distal interphalangeal joint. This probes directly to bone. She arrives in clinic today with an interesting story. She went to [place] last week. She became ill there with refractory nausea and vomiting. She went to an urgent care and then the ER. I am able to see parts of this in Care Everywhere. She had 2 blood cultures done both of which ultimately showed MRSA. This was sensitive to daptomycin and linezolid and vancomycin. Her white count was 4.8 hemoglobin 10.4. X-ray of the left foot showed prior resection of the left fifth metatarsal no evidence of osteomyelitis. Her C-reactive protein on 10/13/2020 was 104.8. She refused admission to the hospital as she was to board her flight to return to [place]. She dialyzed this morning I am not certain why she did not bring this to their attention 7/27; fortunately the left second toe and the erythema in her left foot looks a lot better on the IV vancomycin at dialysis. I was able to verify earlier this week that she had indeed received it on the weekend starting last Saturday. She will need at least 2 weeks. I saw her left arm shunt and  it looks fine. We are using silver alginate on the toe and we have been using I think Medihoney on the right foot which is actually been her decision. She has 2 open wounds  dorsally. She does not describe fever chills her blood sugars are erratic but seem to be at her baseline Objective Constitutional Patient is hypertensive.. Pulse regular and within target range for patient.Marland Kitchen Respirations regular, non-labored and within target range.. Temperature is normal and within the target range for the patient.Marland Kitchen Appears in no distress. Vitals Time Taken: 2:00 PM, Height: 67 in, Weight: 125 lbs, BMI: 19.6, Temperature: 98.3 F, Pulse: 88 bpm, Respiratory Rate: 16 breaths/min, Blood Pressure: 194/98 mmHg. General Notes: Wound exam; left plantar foot remains healed. Left second toe which was necrotic and probing to bone last week with spreading cellulitis into her forefoot all looks remarkably better. The toe is epithelialized the erythema in the forefoot is gone there is still some erythema in the toe however. oo She has 2 open areas on the right dorsal foot I think she has been using Medihoney. We thought we were ordering silver collagen Integumentary (Hair, Skin) Wound #51 status is Open. Original cause of wound was Trauma. The date acquired was: 07/29/2020. The wound has been in treatment 12 weeks. The wound is located on the Left T Second. The wound measures 1.8cm length x 0.7cm width x 0.6cm depth; 0.99cm^2 area and 0.594cm^3 volume. There is bone and Fat oe Layer (Subcutaneous Tissue) exposed. There is no tunneling or undermining noted. There is a medium amount of serosanguineous drainage noted. The wound margin is distinct with the outline attached to the wound base. There is medium (34-66%) red, pink granulation within the wound bed. There is a medium (34-66%) amount of necrotic tissue within the wound bed including Adherent Slough. Wound #52 status is Open. Original cause of wound was Gradually Appeared. The date acquired was: 09/02/2020. The wound has been in treatment 7 weeks. The wound is located on the Right Foot. The wound measures 0.6cm length x 2.9cm width x  0.1cm depth; 1.367cm^2 area and 0.137cm^3 volume. There is Fat Layer (Subcutaneous Tissue) exposed. There is no tunneling or undermining noted. There is a medium amount of serosanguineous drainage noted. The wound margin is distinct with the outline attached to the wound base. There is medium (34-66%) pink granulation within the wound bed. There is a medium (34-66%) amount of necrotic tissue within the wound bed including Adherent Slough. 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 There was a Chemical/Enzymatic/Mechanical debridement performed by [name], RN.. Other agent used was Anasept and gauze to remove biofilm. A time out was conducted at 14:25, prior to the start of the procedure. There was no bleeding. The procedure was tolerated well with a pain level of 0 throughout and a pain level of 0 following the procedure. Post Debridement Measurements: 0.6cm length x 2.9cm width x 0.1cm depth; 0.137cm^3 volume. Character of Wound/Ulcer Post Debridement is improved. Severity of Tissue Post Debridement is: Fat layer exposed. Post procedure Diagnosis Wound #52: Same as Pre-Procedure Plan Follow-up Appointments: Return appointment in 3 weeks. -  with Dr. Dellia Nims Bathing/ Shower/ Hygiene: May shower and wash wound with soap and water. Edema Control - Lymphedema / SCD / Other: Elevate legs to the level of the heart or above for 30 minutes daily and/or when sitting, a frequency of: - throughout the day Avoid standing for long periods of time. Moisturize legs daily. -  with dressing changes Off-Loading: Open toe surgical shoe to: - to both feet Other: - minimal weight bearing right foot WOUND #51: - T Second Wound Laterality: Left oe Cleanser: Soap and Water Every Other Day/30 Days Discharge Instructions: May shower and wash wound with dial antibacterial soap and water prior to dressing change. Prim Dressing: KerraCel Ag Gelling Fiber Dressing, 4x5 in (silver alginate) Every Other Day/30 Days ary Discharge Instructions: Apply silver alginate to wound bed as instructed Secondary Dressing: Woven Gauze Sponge, Non-Sterile 4x4 in (Generic) Every Other Day/30 Days Discharge Instructions: Apply over primary dressing as directed. Secured With: Child psychotherapist, Sterile 2x75 (in/in) Every Other Day/30 Days Discharge Instructions: Secure with stretch gauze as directed. Secured With: Paper T ape, 1x10 (in/yd) Every Other Day/30 Days Discharge Instructions: Secure dressing with tape as directed. WOUND #52: - Foot Wound Laterality: Right Cleanser: Soap and Water Every Other Day/30 Days Discharge Instructions: May shower and wash wound with dial antibacterial soap and water prior to dressing change. Prim Dressing: PolyMem Non-Adhesive Dressing, 4x4 in (DME) (Generic) Every Other Day/30 Days ary Discharge Instructions: Apply to wound bed as instructed Secondary Dressing: Woven Gauze Sponge, Non-Sterile 4x4 in (Generic) Every Other Day/30 Days Discharge Instructions: Apply over primary dressing as directed. Secured With: Elastic Bandage 4 inch (ACE bandage) Every Other Day/30 Days Discharge Instructions: Secure with ACE bandage as directed. Secured With: Child psychotherapist, Sterile 2x75 (in/in) Every Other Day/30 Days Discharge Instructions: Secure with stretch gauze as directed. Secured With: Paper T ape, 1x10 (in/yd) Every Other Day/30 Days Discharge Instructions: Secure dressing with tape as directed. #1 I change the dressing on the  wounds on the right dorsal foot to polymen. This was after a vigorous debridement with Anasept and gauze 2. The area on the left second toe includes the tip of the toe into the nailbed. This all looks a lot better. 3. I have no doubt she has osteomyelitis in the left second toe. MRSA sepsis identified in [place] before she came back down here. She seemed to tolerate this remarkably well. We have her on vancomycin and at dialysis. 4. Her shunt site looks fine 5. She has what sounds to be systolic ejection murmur either aortic stenosis or sclerosis but also has what sounds like tricuspid regurgitation. I will need to check an echocardiogram on her. I doubt this is related to her sepsis Electronic Signature(s) Signed: 10/26/2020 4:50:28 PM By: Linton Ham MD Entered By: Linton Ham on 10/26/2020 14:40:41 -------------------------------------------------------------------------------- SuperBill Details Patient Name: Date of Service: [place] LLO [address] [name] 10/26/2020 Medical Record Number: [id number] Patient Account Number: [id number] Date of Birth/Sex: Treating RN: [id number] (50 y.o. 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

## 2020-11-02 ENCOUNTER — Encounter: Payer: Self-pay | Admitting: Endocrinology

## 2020-11-02 ENCOUNTER — Encounter: Payer: Medicare HMO | Admitting: Endocrinology

## 2020-11-02 ENCOUNTER — Other Ambulatory Visit: Payer: Self-pay

## 2020-11-02 VITALS — BP 144/82 | HR 86 | Ht 67.0 in | Wt 134.0 lb

## 2020-11-02 DIAGNOSIS — E1051 Type 1 diabetes mellitus with diabetic peripheral angiopathy without gangrene: Secondary | ICD-10-CM

## 2020-11-02 LAB — POCT GLYCOSYLATED HEMOGLOBIN (HGB A1C): Hemoglobin A1C: 8.1 % — AB (ref 4.0–5.6)

## 2020-11-02 NOTE — Progress Notes (Signed)
This encounter was created in error - please disregard.

## 2020-11-07 ENCOUNTER — Telehealth: Payer: Self-pay | Admitting: Endocrinology

## 2020-11-07 DIAGNOSIS — E1051 Type 1 diabetes mellitus with diabetic peripheral angiopathy without gangrene: Secondary | ICD-10-CM

## 2020-11-07 DIAGNOSIS — E7800 Pure hypercholesterolemia, unspecified: Secondary | ICD-10-CM

## 2020-11-07 MED ORDER — PRAVASTATIN SODIUM 40 MG PO TABS: 40.0000 mg | ORAL_TABLET | Freq: Every day | ORAL | 1 refills | Status: AC

## 2020-11-07 MED ORDER — INSULIN ASPART 100 UNIT/ML IJ SOLN: INTRAMUSCULAR | 1 refills | Status: DC

## 2020-11-07 NOTE — Telephone Encounter (Signed)
Rx was sent to preferred pharmacy

## 2020-11-07 NOTE — Telephone Encounter (Signed)
Pt is needing a refill   insulin aspart (NOVOLOG) 100 UNIT/ML injection is needing a 3 month supply   pravastatin (PRAVACHOL) 40 MG tablet if can do it 3 month supply for this as well     [id number] - [place], [place] - [place]  Pt voiced that [employer] is stating that pt can not have her pump supplies until [name] gives the okay for her to have them

## 2020-11-08 ENCOUNTER — Telehealth: Payer: Self-pay

## 2020-11-08 NOTE — Telephone Encounter (Signed)
Patient called in states [facility] needs office notes to complete her request for pump supplies. Informed patient that there are no office notes from the past 6 months. Patient states that she had visit a few days ago but was not seen because her pump would not download. Please advise

## 2020-11-09 IMAGING — DX DG TIBIA/FIBULA 2V*R*
4 series · 4 of 4 positions shown · non-contrast
Comparison: September 29, 2016

CLINICAL DATA: Atraumatic diffuse right leg pain.

EXAM:
RIGHT TIBIA AND FIBULA - 2 VIEW

[tibia ap (1 of 2)]
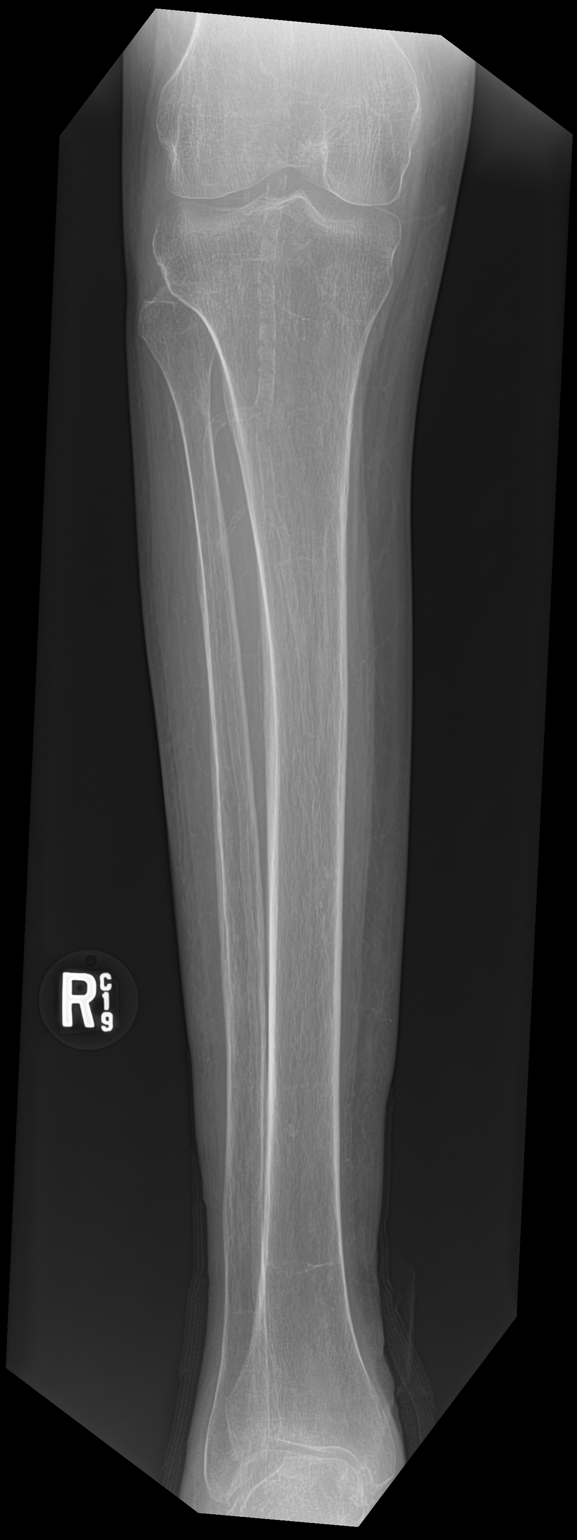

[tibia ap (2 of 2)]
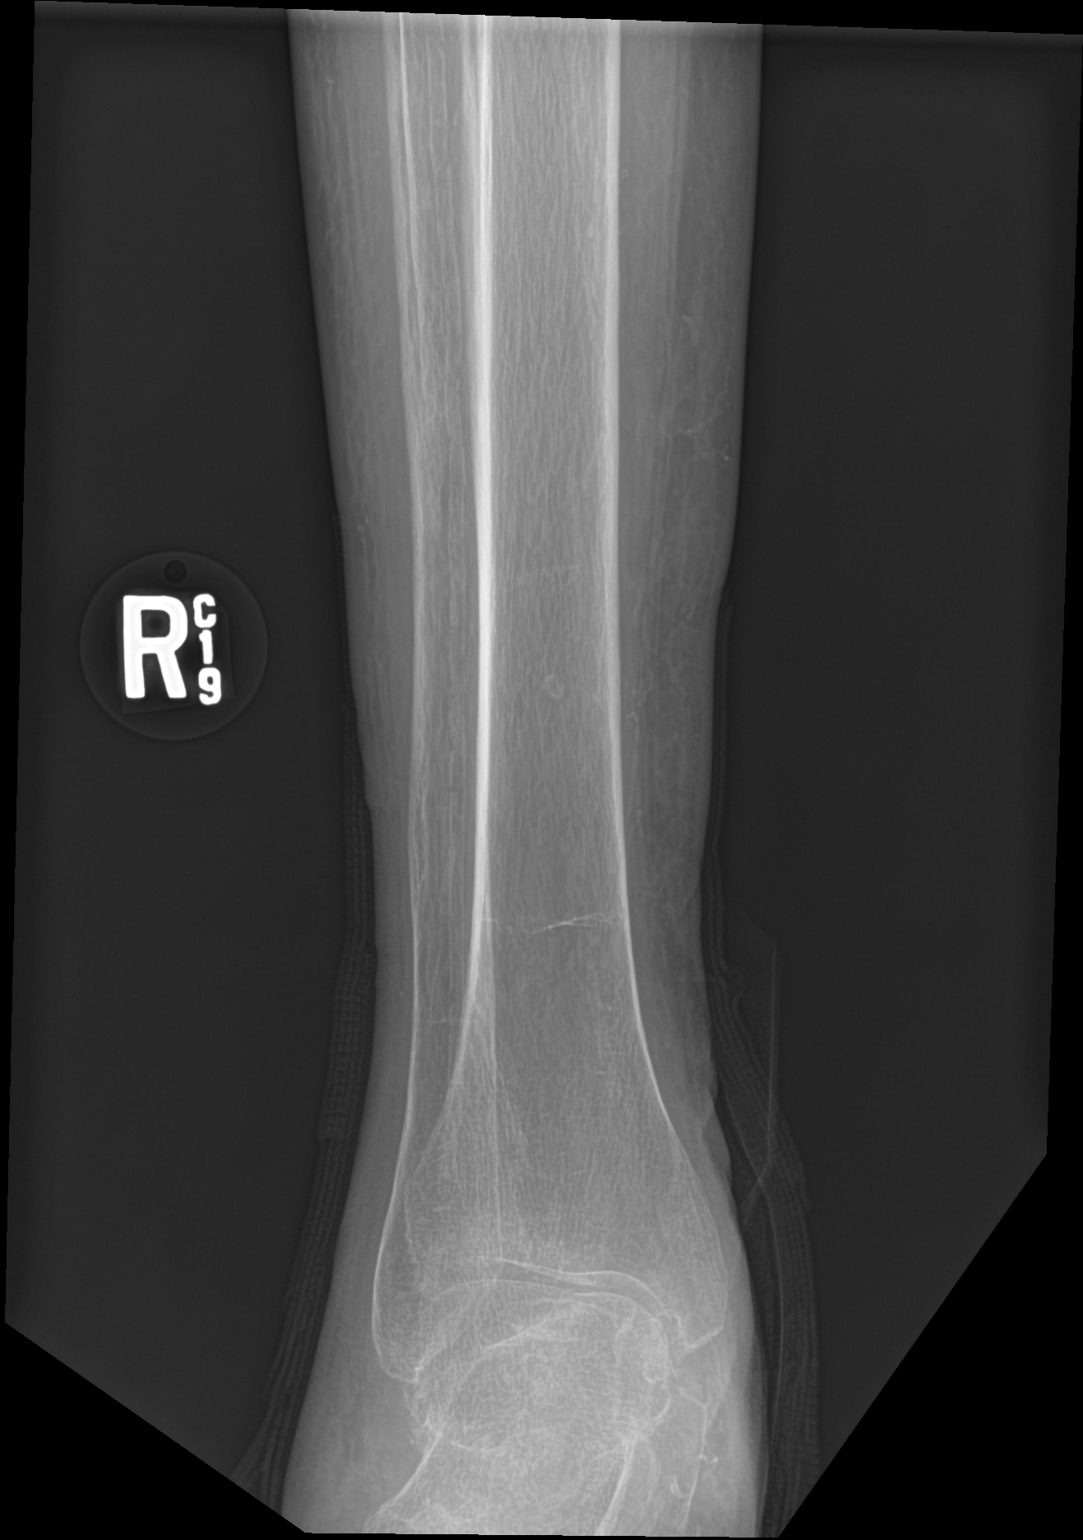

[tibia lat (1 of 2)]
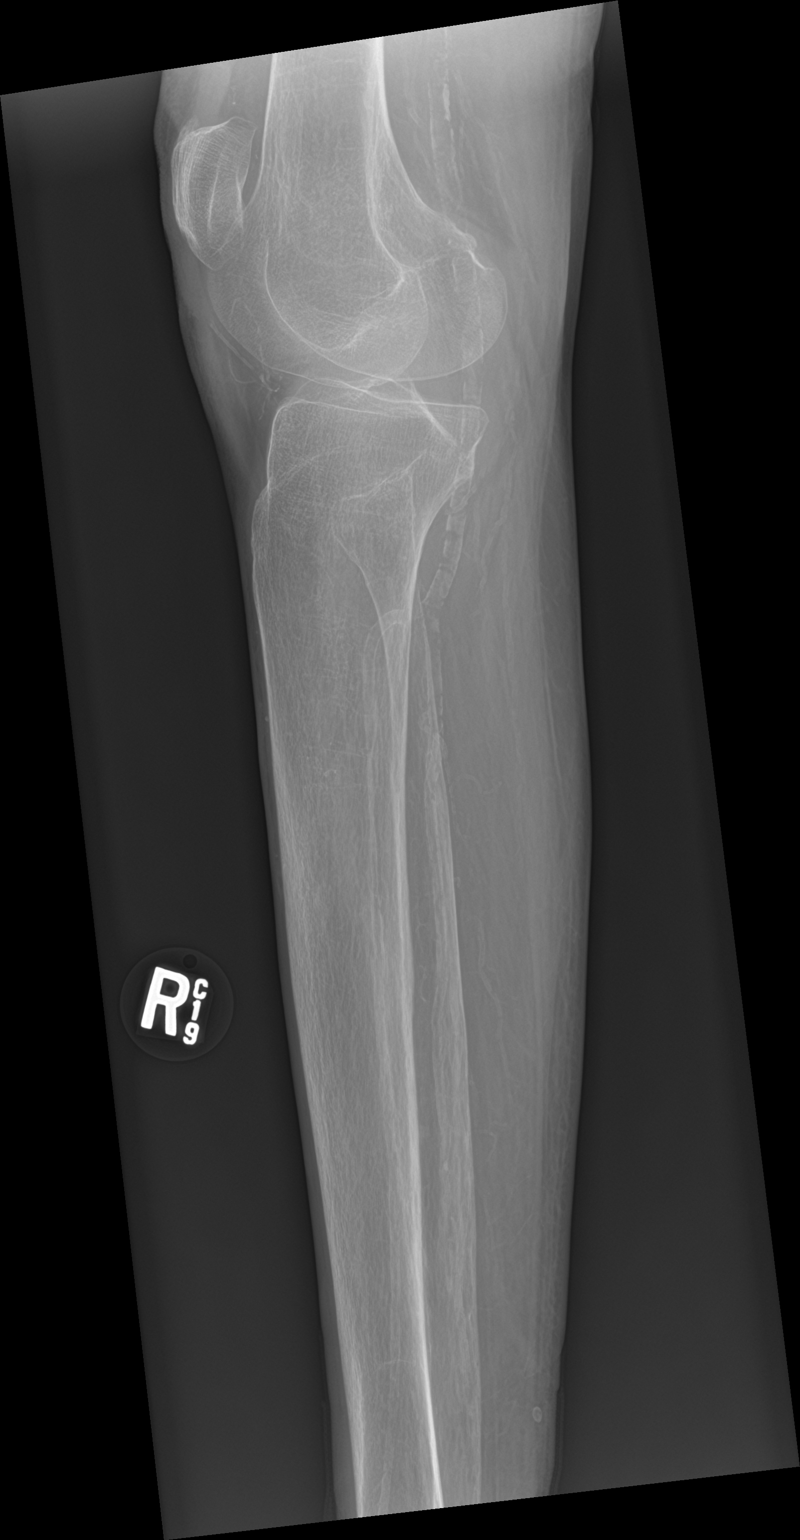

[tibia lat (2 of 2)]
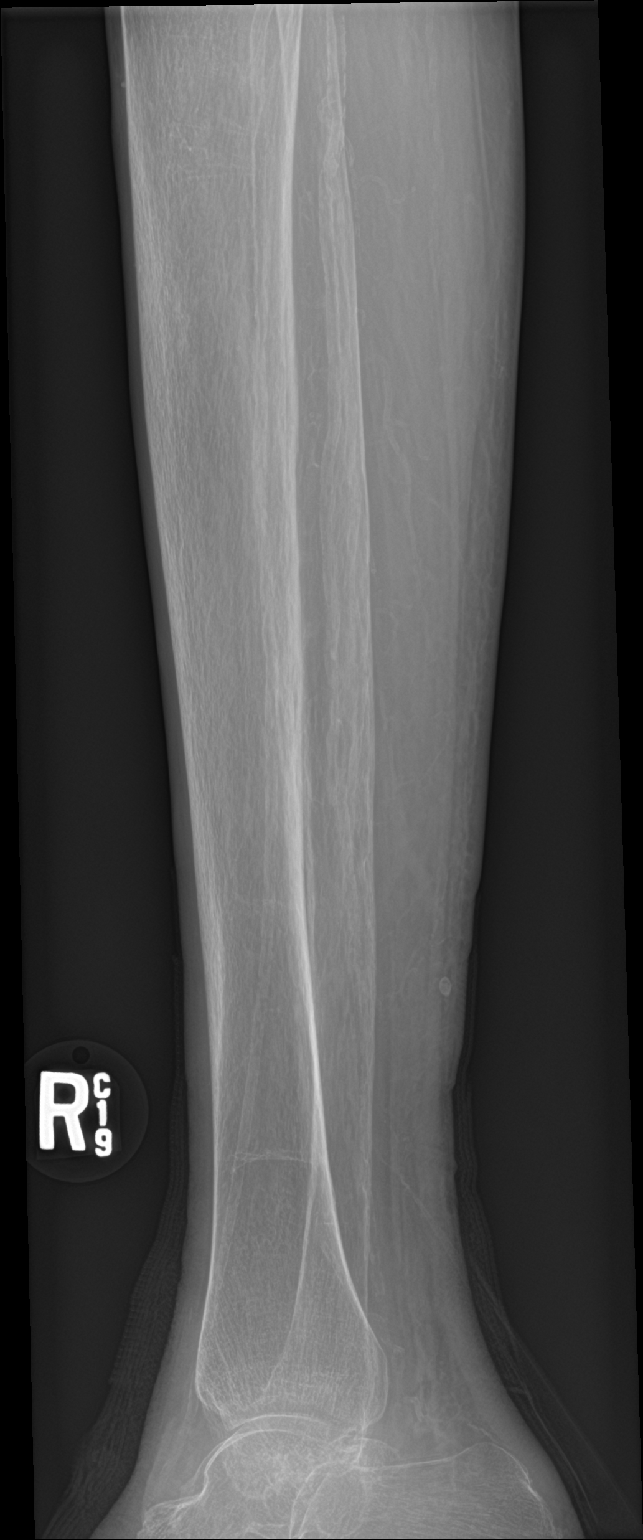

[4 of 4 positions shown; findings below may reference images not displayed]

FINDINGS: No acute fracture lucency or dislocation. Diffuse bone
demineralization. Coarse atherosclerotic calcification of the right
popliteal, perennial and anterior and posterior tibialis arteries.
Small phlebolith in the posterior ankle. Right suprapatellar
enthesophyte. Diffuse mild soft tissue edema. Mild degenerative bone
spurring of the superior patellar facet. Right talar tilt. There is
no cortical erosion.
IMPRESSION: Advanced peripheral calcified atherosclerotic disease of the right
distal lower extremity.

Diffuse bone demineralization with mild degenerative change of the
right patella and right suprapatellar enthesophytosis, but no acute
bony injury of the right tibia or fibula.

Diffuse nonspecific soft tissue edema of the right distal lower
extremity, possibly secondary to volume overload with third-spacing,
ischemic, inflammatory or infectious.

## 2020-11-17 ENCOUNTER — Other Ambulatory Visit: Payer: Self-pay

## 2020-11-17 ENCOUNTER — Encounter (HOSPITAL_BASED_OUTPATIENT_CLINIC_OR_DEPARTMENT_OTHER): Payer: Medicare HMO | Attending: Internal Medicine | Admitting: Internal Medicine

## 2020-11-17 DIAGNOSIS — L97521 Non-pressure chronic ulcer of other part of left foot limited to breakdown of skin: Secondary | ICD-10-CM | POA: Insufficient documentation

## 2020-11-17 DIAGNOSIS — G40909 Epilepsy, unspecified, not intractable, without status epilepticus: Secondary | ICD-10-CM | POA: Insufficient documentation

## 2020-11-17 DIAGNOSIS — E1042 Type 1 diabetes mellitus with diabetic polyneuropathy: Secondary | ICD-10-CM | POA: Insufficient documentation

## 2020-11-17 DIAGNOSIS — L97514 Non-pressure chronic ulcer of other part of right foot with necrosis of bone: Secondary | ICD-10-CM | POA: Diagnosis not present

## 2020-11-17 DIAGNOSIS — Z992 Dependence on renal dialysis: Secondary | ICD-10-CM | POA: Diagnosis not present

## 2020-11-17 DIAGNOSIS — E10621 Type 1 diabetes mellitus with foot ulcer: Secondary | ICD-10-CM | POA: Diagnosis not present

## 2020-11-17 DIAGNOSIS — Z9641 Presence of insulin pump (external) (internal): Secondary | ICD-10-CM | POA: Insufficient documentation

## 2020-11-17 DIAGNOSIS — Z87891 Personal history of nicotine dependence: Secondary | ICD-10-CM | POA: Insufficient documentation

## 2020-11-17 DIAGNOSIS — E1022 Type 1 diabetes mellitus with diabetic chronic kidney disease: Secondary | ICD-10-CM | POA: Diagnosis not present

## 2020-11-17 DIAGNOSIS — N186 End stage renal disease: Secondary | ICD-10-CM | POA: Insufficient documentation

## 2020-11-17 DIAGNOSIS — L02415 Cutaneous abscess of right lower limb: Secondary | ICD-10-CM | POA: Insufficient documentation

## 2020-11-17 DIAGNOSIS — Z86718 Personal history of other venous thrombosis and embolism: Secondary | ICD-10-CM | POA: Insufficient documentation

## 2020-11-17 DIAGNOSIS — I120 Hypertensive chronic kidney disease with stage 5 chronic kidney disease or end stage renal disease: Secondary | ICD-10-CM | POA: Insufficient documentation

## 2020-11-17 DIAGNOSIS — L97419 Non-pressure chronic ulcer of right heel and midfoot with unspecified severity: Secondary | ICD-10-CM | POA: Diagnosis not present

## 2020-11-17 NOTE — Progress Notes (Signed)
[name], [name] ([id number]) Visit Report for 11/17/2020 HPI Details Patient Name: Date of Service: Nancy LLO [name] 11/17/2020 2:00 PM Medical Record Number: [id number] Patient Account Number: [id number] Date of Birth/Sex: Treating RN: [birth year] (50 y.o. [name] Primary Care Provider: [name], [name] Other Clinician: Referring Provider: Treating Provider/Extender: [name], [name] Weeks in Treatment: 115 History of Present Illness HPI Description: When50 year old diabetic who is known to have type 1 diabetes which is poorly controlled last hemoglobin A1c was 11%. She comes in with a ulcerated area on the left lateral foot which has been there for over 6 months. Was recently she has been treated by [name] of podiatry who saw her last on 05/28/2016. Review of his notes revealed that the patient had incision and drainage with placement of antibiotic beads to the left foot on 04/11/2016 for possible osteomyelitis of the cuboid bone. Over the last year she's had a history of amputation of the left fifth toe and a femoropopliteal popliteal bypass graft somewhere in April 2017. 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 His note [name] mentions that the patient has been referred to me for further wound care and possibly great candidate for hyperbaric oxygen therapy due to recurrent osteomyelitis. However we do not have any x-rays of biopsy reports confirming this. He has been on several antibiotics including Bactrim and most recently is on doxycycline for an MRSA. I understand, the patient was not a candidate for IV antibiotics as she has had previous PICC lines which resulted in blood clots in both arms. There was a x-ray report dated 04/04/2016 on [name] notes which showed evidence of fifth ray resection left foot with osteolytic changes noted to the fourth metatarsal and cuboid bone on the left. 06/13/2016 -- had a left foot x-ray which  showed no acute fracture or dislocation and no definite radiographic evidence of osteomyelitis. Advanced osteopenia was seen. 06/20/2016 -- she has noticed a new wound on the right plantar foot in the region where she had a callus before. 06/27/16- the patient did have her x-ray of the right foot which showed no findings to suggest osteomyelitis. She saw her endocrinologist, [name], yesterday. Her A1c in January was 11. He also indicates mismanagement and noncompliance regarding her diabetes. She is currently on Bactrim for a lip infection. She is complaining of nausea, vomiting and diarrhea. She is unable to articulate the exact orders or dosing of the Bactrim; it is unclear when she will complete this. 07/04/2016 -- results from [facility] of ABIs with ankle waveforms were noted from 02/14/2016. The examination done on 06/27/2015 showed noncompressible ABIs with the right being 1.45 and the left being 1.33. The present examination showed a right ABI of 1.19 on the left of 1.33. The conclusion was that right normal ABI in the lower extremity at rest however compared to previous study which was noncompressible ABI may be falsely elevated side suggesting medial calcification. The left ABI suggested medial calcification. 08/01/2016 -- the patient had more redness and pain on her right foot and did not get to come to see as noted she see her PCP or go to the ER and decided to take some leftover metronidazole which she had at home. As usual, the patient does report she feels and is rather noncompliant. 08/08/2016 -- -- x-ray of the right foot -- FINDINGS:Transmetatarsal amputation is noted. No bony destruction is noted to suggest osteomyelitis. IMPRESSION: No evidence of  osteomyelitis. Postsurgical changes are seen. MRI would be more sensitive for possible bony changes. Culture has grown Serratia Marcescens -- sensitive to Bactrim, ciprofloxacin, ceftazidime she was seen by [name] on  08/06/2016. He did not find any exposed bone, muscle, tendon, ligament or joint. There was no malodor and he did a excisional debridement in the office. ============ Old notes: 50 year old patient who is known to the wound clinic for a while had been away from the wound clinic since 49/04/2014. Over the last several months she has been admitted to various hospitals including [facility] [place]. She was treated for a right metatarsal osteomyelitis with a transmetatarsal amputation and this was done about 2 months ago. He has a small ulcerated area on the right heel and she continues to have an ulcerated area on the left plantar aspect of the foot. The patient was recently admitted to the [facility] between 49/12 and 10/18/2014. she was given 3 weeks of IV vancomycin and was to follow-up with her surgeons at [facility] and also took oral vancomycin for C. difficile colitis. 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 MRI of the left foot done on 10/14/2014 showed no abscess or osteomyelitis. 49/25/17; this is a patient we know from previous stays in the wound care center. She is a type I diabetic I am not sure of her control currently. Since the last time I saw her she is had a right transmetatarsal amputation and has no wounds on her right foot and has no open wounds. She is been followed at the wound care center at [facility] in [place]. She comes today with the desire to undergo hyperbaric treatment locally. Apparently one of her wound care providers in [place] has suggested hyperbarics. This is in response to an MRI from 04/18/15 that showed increased marrow signal and loss of the proximal  fifth metatarsal cortex evidence of osteomyelitis with likely early osteomyelitis in the cuboid bone as well. She has a large wound over the base of the fifth metatarsal. She also has a eschar over her the tips of her toes on 1,3 and 5. She does not have peripheral pulses and apparently is going for an angiogram tomorrow which seems reasonable. After this she is going to infectious disease at [facility]. They have been using Medihoney to the large wound on the lateral aspect of the left foot to. The patient has known Charcot deformity from diabetic neuropathy. She also has known diabetic PAD. Surprisingly I can't see that she has had any recent antibiotics, the patient states the last antibiotic she had was at the end of November for 10 days. I think this was in response to culture that showed group G strep although I'm not exactly sure where the culture was from. She is also had arterial studies on 03/29/15. This showed a right ABI of 1.4 that was noncompressible. Her left ABI was 0.73. 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 It was not felt that arterial inflow was adequate for healing of a foot ulcer. Her Doppler waveforms looked monophasic ===== READMISSION 02/28/17; this is in an now 50 year old woman we've had at several different occasions in this clinic. 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 She has  a remote ex-smoker. She was last seen in this clinic by Dr. Con Memos I think in May. More recently she is been followed by her podiatrist [name] an infectious disease Dr. Megan Salon. She has 2 open wounds the major one is over the right first metatarsal head she also has a wound on the left plantar foot. an MRI of the right foot on 01/01/17 showed a soft tissue ulcer along the plantar aspect of the first metatarsal base consistent with osteomyelitis of the first metatarsal stump. Dr. 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 According to the patient this is been open for slightly over a month. She has been on a combination of Cipro 500 twice a day, Zyvox 600 twice a day and Flagyl 500 3 times a day for over a month now as directed by Dr. Megan Salon. cultures of the right foot earlier this year showed MRSA in January and Serratia in May. January also had a few viridans strep. Recent x-rays of both feet were done and [name] office and I don't have these reports. The patient has known PAD and has a history of aleft femoropopliteal bypass in April 2017. 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 Dwyane Dee. In spite of this the last hemoglobin A1c I can see is 10.1 on 01/01/2017. She is being referred by 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 She is been using Medihoney to this area and also an area on the left midfoot. She is using healing sandals bilaterally. ABIs in this clinic at the left posterior tibial was 1.1 noncompressible on the right READMISSION Non invasive vascular NOVANT 5/18 Aftercare following surgery of the circulatory system Procedure Note - Interface, External Ris In - 08/13/2016 11:05 AM EDT Procedure: Examination consists of physiologic resting arterial pressures of the brachial and ankle arteries bilaterally with continuous wave Doppler waveform analysis. Previous: Previous exam performed on 02/14/16 demonstrated ABIs of Rt = 1.19 and Lt = 1.33. Right: ABI = non-compressible PT 1.47 DP. S/P transmet amputation. , Left: ABI = 1.52, 2nd digit pressure = 87 mmHg Conclusions: Right: ABI (>1.3) may be falsely elevated, suggesting medial calcification. Left: ABI (>1.3) may be falsely  elevated, suggesting medial calcification The patient is a now 50 year old type I diabetic is had multiple issues her graded to chronic diabetic foot ulcers. 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 We had her in the clinic lastin November. At that point she had wounds on her bilateral feet.she had wanted to try hyperbarics however the healogics review process denied her because she hadn't followed up with her vascular surgeon for her left femoropopliteal bypass. The bypass was done by Dr. Raul Del at [facility]. We made her a follow-up with Dr. Raul Del however she did not keep the appointment and therefore she was not approved The patient shows me a small wound on her left fourth metatarsal head on her phone. She developed rapid discoloration in the plantar aspect of the left foot and she was admitted to hospital from 2/2 through 05/10/17 with wet gangrene of the left foot osteomyelitis of the fourth metatarsal heads. She was admitted acutely ill with a temperature of 103.  She was started on broad-spectrum vancomycin and cefepime. On 05/06/17 she was taken to the OR by [name] her podiatric surgeon for an incision and drainage irrigation of the left foot wound. Cultures from this surgery revealed group be strep and anaerobes. she was seen by Dr.Xu of orthopedic surgery and scheduled for a below-knee amputation which she u refused. Ultimately she was discharged on Levaquin and Flagyl for one month. MRI 05/05/17 done while she was in the hospital showed abscess adjacent to the fourth metatarsal head and neck small abscess around the fourth flexor tendon. Inflammatory phlegmon and gas in the soft tissues along the lateral aspect of the fourth phalanx. Findings worrisome for osteomyelitis involving the fourth proximal and middle phalanx and also the third and fourth metatarsals. Finally the patient had actually shortly  before this followed up with Dr. Raul Del at no time on 04/29/17. He felt that her left femoropopliteal bypass was patent he felt that her left-sided toe pressures more than adequate for healing a wound on the left foot. This was before her acute presentation. 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 05/28/17; she is started hyperbarics. The patient tells me that for some reason she was not actually on Levaquin but I think on ciprofloxacin. She was on Flagyl. She only started her Levaquin yesterday due to some difficulty with the pharmacy and perhaps her sister picking it up. She has an appointment with [name] tomorrow and with infectious disease early next week. She has no new complaints 06/06/17; the patient continues in hyperbarics. She saw [name] on 05/29/17 who is her podiatric surgeon. He is elected for a transmetatarsal amputation on 06/27/17. I'm not sure at what level he plans to do this amputation. The patient is unaware She also saw Dr. Megan Salon of infectious disease who elected to continue her on current antibiotics I think this is ciprofloxacin and Flagyl. I'll need to clarify with her tomorrow if she actually has this. We're using silver alginate to the actual wound. Necrotic surface today with material under the flap of her foot. Original MRI showed abscesses as well as osteomyelitis of the proximal and middle fourth phalanx and the third and fourth metatarsal heads 06/11/17; patient continues in hyperbarics and continues on oral antibiotics. She is doing well. The wound looks better. The necrotic part of this under the flap in her superior foot also looks better. she is been to see [name]. I haven't had a chance to look at his note. Apparently he has put the transmetatarsal amputation on hold her request it is still planning to take her to the OR for debridement and product application ACEL. I'll see if I can find his note. I'll therefore leave product ordering/requests to [name]  for now. I was going to look at Dermagraft 06/18/17-she is here in follow-up evaluation for bilateral foot wounds. She continues with hyperbaric therapy. She states she has been applying manuka honey to the right plantar foot and alternate manuka honey and silver alginate to the left foot, despite our orders. We will continue with same treatment plan and she will follo up next week. 06/25/17; I have reviewed [name] last note from 3/11. She has operative debridement in 2 days' time. By review his note apparently they're going to place there is skin over the majority of this wound which is a good choice. She has a small satellite area at the most proximal part of this wound on the left plantar foot. The area on the right  plantar foot we've been using silver alginate and it is close to healing. 07/02/17; unfortunately the patient was not easily approved for [name] proposed surgery. I'm not completely certain what the issue is. She has been using silver alginate to the wound she has completed a first course of hyperbarics. She is still on Levaquin and Flagyl. I have really lost track of the time course here.I suspect she should have another week to 2 of antibiotics. I'll need to see if she is followed up with infectious disease Dr. Megan Salon 07/09/17; the patient is followed up with Dr. Megan Salon. 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 She continues on Levaquin and metronidazole continuing both of these for now I think she is been on fr about 6 weeks. She still has some drainage but no pain. No fever. Her had been plans for her to go to the OR for operative debridement with her podiatrist [name], I am not exactly sure where that is. I'll probably slip a note to [name] today. I note that she follows with Dr. Dwyane Dee of endocrinology. We have her recertified for hyperbaric oxygen. I have not heard about Dermagraft however I'll see if [name] is planning a  skin substitute as well 07/16/17; the patient tells me she is just about out of [place]. I'll need to check Dr. Hale Bogus last notes on this. She states she has plenty of Flagyl however. She comes in today complaining of pain in the right lateral foot which she said lasted for about a day. The wound on the right foot is actually much more medially. She also tells me that the [facility] cost a lot of pain in the left foot wound and she turned back to silver alginate. Finally Dermagraft has a $481 per application co-pay. She cannot afford this 07/23/17; patient arrives today with the wound not much smaller. There is not much new to add. She has not heard from [name] all try to put in a call to them today. She was asking about Dermagraft again and she has an over $856 per application co-pay she states that she would be willing to try to do a payment plan. I been tried to avoid this. We've been using silver alginate, I'll change to [facility] 07/30/17-She is here in follow-up evaluation for left foot ulcer. She continues hyperbaric medicine. The left foot ulcer is stable we will continue with same treatment plan 08/06/17; she is here for evaluation of her left foot ulcer. Currently being treated for hyperbarics or underlying osteomyelitis. She is completed antibiotics. The left foot ulcer is better smaller with healthier looking granulation. For various reasons I am not really clear on we never got her back to the OR with [name]. He did not respond to my secure text message. Nevertheless I think that surgery on this point is not necessary nor am I completely clear that a skin substitute is necessary The patient is complaining about pain on the outside of her right foot. 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 There is no erythema. She also states the foot is warm versus her other part of her upper leg and this is largely true. It is not totally clear to me what's causing this. She  thinks it's different from her usual neuropathy pain 08/13/17; she arrives in clinic today with a small wound which is superficial on her right first metatarsal head. 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 She tells [place] she was up on her feet over the Mother's Day celebration. The large wound is on the left foot. Continues with hyperbarics for underlying osteomyelitis. We're using Hydrofera Blue. She asked me today about where we were with Dermagraft. I had actually excluded this because of the co-pay however she wants to assume this therefore I'll recheck the co-pay an order for next week. 08/20/17; the patient agreed to accept the co-pay of the first Apligraf which we applied today. She is disappointed she is finishing hyperbarics will run this through the insurance on the extent of the foot infection and the extent of the wound that she had however she is already had 60 dive's. Dermagraft No. 1 08/27/17; Dermagraft No. 2. She is not eligible for any more hyperbaric treatments this month. She reports a fair amount of drainage and she actually changed to the external dressings without disturbing the direct contact layer 09/03/17; the patient arrived in clinic today with the wound superficially looking quite healthy. Nice vibrant red tissue with some advancing epithelialization although not as much adherence of the flap as I might like. However she noted on her own fourth toe some bogginess and she brought that to our attention. Indeed this was boggy feeling like a possibility of subcutaneous fluid. 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 She is not been unwell. We've been using Dermagraft 09/10/17; the culture that I did not last week was MRSA. She saw Dr. Megan Salon this morning who is going to start her on vancomycin. I had sent him a secure a text message yesterday. I also spoke with her podiatric surgeon  [name] about surgery on this foot the options for conserving a functional foot etc. Promised me he would see her and will make back consultation today. Paradoxically her actual wound on the plantar aspect of her left foot looks really quite good. I had given her 5 days worth of Baxdella to cover her for MRSA. Her MRI came back showing osteomyelitis within the third metatarsal shaft and head and base of the third and fourth proximal phalanx. She had extensive inflammatory changes throughout the soft tissue of the lateral forefoot. With an ill-defined fluid around the fourth metatarsal extending into the plantar and dorsal soft tissues 09/19/17; the patient is actually on oral Septra and Flagyl. She apparently refused IV vancomycin. She also saw [name] at my request who is planning her for a left BKA sometime in mid July. MRI showed osteomyelitis within the third metatarsal shaft and head and the basis of the third and fourth proximal phalanx. I believe there was felt to be possible septic arthritis involving the third MTP. 09/26/17; the patient went back to Dr. Megan Salon at my suggestion and is now receiving IV daptomycin. Her wound continues to look quite good making the decision to proceed with a transmetatarsal amputation although more difficult for the patient. I believe in my extensive discussions with her she has a good sense of the pros and cons of this. I don't NV the tuft decision she has to make. She has an appointment with [name] I believe in mid July and I previously spoken to him about this issue Has we had used 3 previous Dermagraft. Given the condition of the wound surface I went ahead and added the fourth one today area and I did this not fully realizing that she'll be traveling  to [place] next week. I'm hopeful she can come back in 2 weeks 10/21/17; Her same Dermagraft on for about 3-1/2 weeks. In spite of this the wound arrives looking quite healthy. There is been a lot of  healing dimensions are smaller. Looking at the square shaped wound she has now there is some undermining and some depth medially under the undermining although I cannot palpate any bone. No surrounding infection is obvious. She has difficult questions about how to look at this going forward vis--vis amputations versus continued medical therapy. T be truthful the wound is looks o so healthy and it is continued to contract. Hard to justify foot surgery at this point although I still told her that I think it might come to that if we are not able to eradicate the underlying MRSA. She is still highly at risk and she understands this 11/06/17 on evaluation today patient appears to be doing better in regard to her foot ulcer. She's been tolerating the dressing changes without complication. Currently she is here for her Dermagraft #6. Her wound continues to make excellent progress at this point. She does not appear to have any evidence of infection which is good news. 11/13/17 on evaluation today patient appears to be doing excellent at this time. She is here for repeat Dermagraft application. This is #7. Overall her wound seems to be making great progress. 12/05/17; the patient arrives with the wound in much better condition than when I last saw this almost 6 weeks ago. She still has a small probing area in the left metatarsal head region on the lateral aspect of her foot. We applied her last Dermagraft today. Since the last time she is here she has what appears to a been a blood blister on the plantar aspect of left foot although I don't see this is threatening. There is also a thick raised tissue on the right mid metatarsal head region. This was not there I don't think the last time she was here 3 weeks ago. 12/12/17; the patient continues to have a small programming area in the left metatarsal head region on the lateral aspect of her foot which was the initial large surgical wound. I applied her last Apligraf  last week. I'm going to use Endoform starting today Unfortunately she has an excoriated area in the left mid foot and the right mid foot. The left midfoot looks like a blistered area this was not opened last week it certainly is open today. Using silver alginate on these areas. She promises me she is offloading this. 12/19/17; the small probing area in the left metatarsal head eyes think is shallower. In general her original wound looks better. We've been using Endoform. The area inferiorly that I think was trauma last week still requires debridement a lot of nonviable surface which I removed. She still has an open open area distally in her foot Similarly on the right foot there is tightly adherent surface debris which I removed. Still areas that don't look completely epithelialized. This is a small open area. We used silver alginate on these areas 12/26/2017; the patient did not have the supplies we ordered from last week including the Endoform. The original large wound on the left lateral foot looks healthy. She still has the undermining area that is largely unchanged from last week. She has the same heavily callused raised edged wounds on the right mid and left midfoot. Both of these requiring debridement. We have been using silver alginate on these areas 01/02/2018;  there is still supply issues. We are going to try to use Prisma but I am not sure she actually got it from what she is saying. She has a new open area on the lateral aspect of the left fourth toe [previous fifth ray amputation]. Still the one tunneling area over the fourth metatarsal head. The area is in the midfoot bilaterally still have thick callus around them. She is concerned about a raised swelling on the lateral aspect of the foot. However she is completely insensate 01/10/2018; we are using Prisma to the wounds on her bilateral feet. Surprisingly the tunneling area over the left fourth metatarsal head that was part of  her original surgery has closed down. She has a small open area remaining on the incision line. 2 open areas in the midfoot. 02/10/2018; the patient arrives back in clinic after a month hiatus. She was traveling to visit family in [place]. Is fairly clear she was not offloading the areas on her feet. The original wound over the left lateral foot at the level of metatarsal heads is reopened and probes medially by about a centimeter or 2. She notes that a week ago she had purulent drainage come out of an area on the left midfoot. Paradoxically the worst area is actually on the right foot is extensive with purulent drainage. We will use silver alginate today 02/17/2018; the patient has 3 wounds one over the left lateral foot. She still has a small area over the metatarsal heads which is the remnant of her original surgical wound. This has medial probing depth of roughly 1.4 cm somewhat better than last week. The area on the right foot is larger. We have been using silver alginate to all areas. The area on the right foot and left foot that we cultured last week showed both Klebsiella and Proteus. Both of these are quinolone sensitive. The patient put her's self on Bactrim and Flagyl that she had left hanging around from prior antibiotic usages. She was apparently on this last week when she arrived. I did not realize this. Unfortunately the Bactrim will not cover either 1 of these organisms. We will send in Cipro 500 twice daily for a week 03/04/2018; the patient has 2 wounds on the left foot one is the original wound which was a surgical wound for a deep DFU. At one point this had exposed bone. She still has an area over the fourth metatarsal head that probes about 1.4 cm although I think this is better than last week. I been using silver nitrate to try and promote tissue adherence and been using silver alginate here. She also has an area in the left midfoot. This has some depth but a small linear wound.  Still requiring debridement. On the right midfoot is a circular wound. A lot of thick callus around this area. We have been using silver alginate to all wound areas She is completed the ciprofloxacin I gave her 2 weeks ago. 03/11/2018; the patient continues to have 2 open areas on the left foot 1 of which was the original surgical wound for a deep DFU. Only a small probing area remains although this is not much different from last week we have been using silver alginate. The other area is on the midfoot this is smaller linear but still with some depth. We have been using silver alginate here as well On the right foot she has a small circular wound in the mid aspect. This is not much smaller than last  time. We have been using silver alginate here as well 03/18/2018; she has 3 wounds on the left foot the original surgical wound, a very superficial wound in the mid aspect and then finally the area in the mid plantar foot. She arrives in today with a very concerning area in the wound in the mid plantar foot which is her most proximal wound. There is undermining here of roughly 1-1/2 cm superiorly. Serosanguineous drainage. She tells me she had some pain on for over the weekend that shot up her foot into her thigh and she tells me that she had a nodule in the groin area. She has the single wound in the right foot. We are using endoform to both wound areas 03/24/2018; the patient arrives with the original surgical wound in the area on the left midfoot about the same as last week. There is a collection of fluid under the surface of the skin extending from the surgical wound towards the midfoot although it does not reach the midfoot wound. The area on the right foot is about the same. Cultures from last week of the left midfoot wound showed abundant Klebsiella abundant Enterococcus faecalis and moderate methicillin resistant staph I gave her Levaquin but this would have only covered the Klebsiella. She  will need linezolid 04/01/2018; she is taking linezolid but for the first few days only took 1 a day. I have advised her to finish this at twice daily dosing. In any case all of her wounds are a lot better especially on the left foot. The original surgical wound is closed. The area on the left midfoot considerably smaller. The area on the right foot also smaller. 04/08/2018; her original surgical wound/osteomyelitis on the left foot remains closed. She has area on the left foot that is in the midfoot area but she had some streaking towards this. This is not connected with her original wound at least not visually. Small wound on the right midfoot appears somewhat smaller. 04/15/18; both wounds looks better. Original wound is better left midfoot. Using silver alginate 1/21; patient states she uses saltwater soak in, stones or remove callus from around her wounds. She is also concerned about a blood blister she had on the left foot but it simply resolved on its own. We've been using silver alginate 1/28; the patient arrives today with the same streaking area from her metatarsals laterally [the site of her original surgical wound] down to the middle of her foot. There is some drainage in the subcutaneous area here. This concerns me that there is actually continued ongoing infection in the metatarsals probably the fourth and third. This fixates an MRI of the foot without contrast [chronic renal failure] The wound in the mid part of the foot is small but I wonder whether this area actually connects with the more distal foot. The area on the right midfoot is probably about the same. Callus thick skin around the small wound which I removed with a curette we have been using silver alginate on both wound areas 2/4; culture I did of the draining site on the left foot last time grew methicillin sensitive staph aureus. MRI of the left foot showed interval resolution of the findings surrounding the third metatarsal  joint on the prior study consistent with treated osteomyelitis. Chronic soft tissue ulceration in the plantar and lateral aspect of the forefoot without residual focal fluid collection. No evidence of recurrent osteomyelitis. Noted to have the previous amputation of the distal first phalanx and fifth  ray MRI of the right foot showed no evidence of osteomyelitis I am going to treat the patient with a prolonged course of antibiotics directed against MSSA in the left foot 2/11; patient continues on cephalexin. She tells me she had nausea and vomiting over the weekend and missed 2 days. In general her foot looks much the same. She has a small open area just below the left fourth metatarsal head. A linear area in the left midfoot. Some discoloration extending from the inferior part of this into the left lateral foot although this appears to be superficial. She has a small area on the right midfoot which generally looks smaller after debridement 2/18; the patient is completing his cephalexin and has another 2 days. She continues to have open areas on the left and right foot. 2/25; she is now off antibiotics. The area on the left foot at the site of her original surgical wound has closed yet again. She still has open areas in the mid part of her foot however these appear smaller. The area on the right mid foot looks about the same. We have been using silver alginate She tells me she had a serious hypoglycemic spell at home. She had to have EMS called and get IV dextrose 3/3; disappointing on the left lateral foot large area of necrotic tissue surrounding the linear area. This appears to track up towards the same original surgical wound. Required extensive debridement. The area on the right plantar foot is not a lot better also using silver 3/12; the culture I did last time showed abundant enterococcus. I have prescribed Augmentin, should cover any unrecognized anaerobes as well. In addition there were a few  MRSA and Serratia that would not be well covered although I did not want to give her multiple antibiotics. She comes in today with a new wound in the right midfoot this is not connected with the original wound over her MTP a lot of thick callus tissue around both wounds but once again she said she is not walking on these areas 3/17-Patient comes in for follow-up on the bilateral plantar wounds, the right midfoot and the left plantar wound. Both these are heavily callused surrounding the wounds. We are continuing to use silver alginate, she is compliant with offloading and states she uses a wheelchair fairly often at home 3/24; both wound areas have thick callus. However things actually look quite a bit better here for the majority of her left foot and the right foot. 3/31; patient continues to have thick callused somewhat irritated looking tissue around the wounds which individually are fairly superficial. There is no evidence of surrounding infection. We have been using silver alginate however I change that to [facility] today 4/17; patient returns to clinic after having a scare with Covid she tested negative in her primary doctor's office. She has been using Hydrofera Blue. She does not have an open area on the right foot. On the left foot she has a small open area with the mid area not completely viable. She showed me pictures of what looks like a hemorrhagic blister from several days ago but that seems to have healed over this was on the lateral left foot 4/21; patient comes in to clinic with both her wounds on her feet closed. However over the weekend she started having pain in her right foot and leg up into the thigh. She felt as though she was running a low-grade fever but did not take her temperature. She took a  doxycycline that she had leftover and yesterday a single Septra and metronidazole. She thinks things feel somewhat better. 4/28; duplex ultrasound I ordered last week was negative  for DVT or superficial thrombophlebitis. She is completed the doxycycline I gave her. States she is still having a lot of pain in the right calf and right ankle which is no better than last week. She cannot sleep. She also states she has a temperature of up to 101, coughing and complaining of visual loss in her bilateral eyes. Apparently she was tested for Covid 2 weeks ago at [facility] and that was negative. Readmission: 09/03/18 patient presents back for reevaluation after having been evaluated at the end of April regarding erythema and swelling of her right lower extremity. Subsequently she ended up going to the hospital on 07/29/18 and was admitted not to be discharged until 08/08/18. Unfortunately it was noted during the time that she was in the hospital that she did have methicillin-resistant Staphylococcus aureus as the infection noted at the site. It was also determined that she did have osteomyelitis which appears to be fairly significant. She was treated with vancomycin and in fact is still on IV vancomycin at dialysis currently. This is actually slated to continue until 09/12/18 at least which will be the completion of the six weeks of therapy. Nonetheless based on what I'm seeing at this point I'm not sure she will be anywhere near ready to discontinue antibiotics at that time. Since she was released from the hospital she was seen by [name] who is her podiatrist on 08/27/18. His note specifically states that he is recommended that the patient needs of one knee amputation on the right as she has a life- threatening situation that can lead quickly to sepsis. The patient advised she would like to try to save her leg to which [name] apparently told her that this was against all medical advice. She also want to discontinue the Wound VAC which had been initiated due to the fact that she wasn't pleased with how the wound was looking and subsequently she wanted to pursue applying Medihoney at that time.  He stated that he did not believe that the right lower extremity was salvageable and that the patient understood but would still like to attempt hyperbaric option therapy if it could be of any benefit. She was therefore referred back to [place] for further evaluation. He plans to see her back next week. Upon inspection today patient has a significant amount purulent drainage noted from the wound at this point. The bone in the distal portion of her foot also appears to be extremely necrotic and spongy. When I push down on the bone it bubbles and seeps purulent drainage from deeper in the end of the foot. I do not think that this is likely going to heal very well at all and less aggressive surgical debridement were undertaken more than what I believe we can likely do here in our office. 09/12/2018; I have not seen this patient since the most recent hospitalization although she was in our clinic last week. I have reviewed some of her records from a complex hospitalization. 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 There is situation was complicated by MRSA bacteremia and acute on chronic renal failure now on dialysis. She is receiving vancomycin at dialysis. We started her on Dakin's wet-to-dry last week she is changing this daily. There is still purulent drainage coming out of her foot.  Although she is apparently "agreeable" to a below-knee amputation which is been suggested by multiple clinicians she wants this to be done in [place]. She apparently has a telehealth visit with that provider sometime in late Tye 6/24. I have told her I think this is probably too long. Nevertheless I could not convince her to allow a local doctor to perform BKA. 09/19/2018; the patient has a large necrotic area on the right anterior foot. She has had previous transmetatarsal amputations. Culture I did last week showed MRSA nothing else she is on vancomycin at dialysis. She  has continued leaking purulent drainage out of the distal part of the large circular wound on the right anterior foot. She apparently went to see Dr. 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 Somehow that translated into her being referred to plastic surgery for debridement of the area. I gather she basically refused amputation although I do not have a copy of Dr. Berenice Primas notes. The patient really wants to have a trial of hyperbaric oxygen. I agreed with initial assessment in this clinic that this was probably too far along to benefit however if she is going to have plastic surgery I think she would benefit from ancillary hyperbaric oxygen. The issue here is that the patient has benefited as maximally as any patient I have ever seen from hyperbaric oxygen therapy. Most recently she had exposed bone on the lateral part of her left foot after a surgical procedure and that actually has closed. She has eschared areas in both heels but no open area. She is remained systemically well. I am not optimistic that anything can be done about this but the patient is very clear that she wants an attempt. The attempt would include a wound VAC further debridements and hyperbaric oxygen along with IV antibiotics. 6/26; I put her in for a trial of hyperbaric oxygen only because of the dramatic response she has had with wounds on her left midfoot earlier this year which was a surgical wound that went straight to her bone over the metatarsal heads and also remotely the left third toe. We will see if we can get this through our review process and insurance. She arrives in clinic with again purulent material pouring out of necrotic bone on the top of the foot distally. There is also some concerning erythema on the front of the leg that we marked. It is bit difficult to tell how tender this is because of neuropathy. I note from infectious disease that she had her vancomycin extended. All the  cultures of these areas have shown MRSA sensitive to vancomycin. She had the wound VAC on for part of the week. The rest of the time she is putting various things on this including Medihoney, "ionized water" silver sorb gel etc. 7/7; follow-up along with HBO. She is still on vancomycin at dialysis. She has a large open area on the dorsal right foot and a small dark eschar area on her heel. There is a lot less erythema in the area and a lot less tenderness. From an infection point of view I think this is better. She still has a lot of necrosis in the remaining right forefoot [previous TMA] we are still using the wound VAC in this area 7/16; follow-up along with HBO. I put her on linezolid after she finished her vancomycin. We started this last Friday I gave her 2 weeks worth. I had the expectation that she would be operatively debrided by Dr. Marla Roe  but that still has not happened yet. Patient phoned the office this week. She arrives for review today after HBO. The distal part of this wound is completely necrotic. Nonviable pieces of tendon bone was still purulent drainage. Also concerning that she has black eschar over the heel that is expanding. I think this may be indicative of infection in this area as well. She has less erythema and warmth in the ankle and calf but still an abnormal exam 7/21 follow-up along with HBO. I will renew her linezolid after checking a CBC with differential monitoring her blood counts especially her platelets. She was supposed to have surgery yesterday but if I am reading things correctly this was canceled after her blood sugar was found to be over 500. I thought Dr. Marla Roe who called me said that they were sending her to the ER but the patient states that was not the case. 7/28. Follow-up along with HBO. She is on linezolid I still do not have any lab work from dialysis even though I called last week. The patient is concerned about an area on her left lateral  foot about the level of the base of her fifth metatarsal. I did not really see anything that ominous here however this patient is in [place] ability to point out problems that she is sensing and she has been accurate in the past Finally she received a call from Dr. Marla Roe who is referring her to another orthopedic surgeon stating that she is too booked up to take her to the operating room now. Was still using a wound VAC on the foot 8/3 -Follow-up after HBO, she is got another week of linezolid, she is to call ID for an appointment, x-rays of both feet were reviewed, the left foot x-ray with third MTP joint osteo- Right foot x-ray widespread osteo-in the right midfoot Right ankle x-ray does not show any active evidence of infection 8/11-Patient is seen after HBO, the wounds on the right foot appear to be about the same, the heel wound had some necrotic base over tendon that was debrided with a curette 8/21; patient is seen after HBO. The patient's wound on her dorsal foot actually looks reasonably good and there is substantial amount of epithelialization however the open area distally still has a lot of necrotic debris partially bone. I cannot really get a good sense of just how deep this probes under the foot. She has been pressuring me this week to order medical maggots through a company in [place] for her. The problem I have is there is not a defined wound area here. On the positive side there is no purulence. She has been to see infectious disease she is still on Septra DS although I have not had a chance to review their notes 8/28; patient is seen in conjunction with HBO. The wounds on her foot continued to improve including the right dorsal foot substantially the, the distal part of this wound and the area on the right heel. We have been using a wound VAC over this chronically. She is still on trimethoprim as directed by infectious disease 9/4; patient is seen in conjunction with HBO.  Right dorsal foot wound substantially anteriorly is better however she continues to have a deep wound in the distal part of this that is not responding. We have been using silver collagen under border foam Area on the right plantar medial heel seems better. We have been using Hydrofera Blue 12/12/18 on evaluation today patient appears to be  doing about the same with regard to her wound based on prior measurements. She does have some necrotic tissue noted on the lateral aspect of the wound that is going require a little bit of sharp debridement today. This includes what appears to be potentially either severely necrotic bone or tendon. Nonetheless other than that she does not appear to have any severe infection which is good news 9/18; it is been 2 weeks since I saw this wound. She is tolerating HBO well. Continued dramatic improvement in the area on the right dorsal foot. She still has a small wound on the heel that we have been using Hydrofera Blue. She continues with a wound VAC 9/24; patient has to be seen emergently today with a swelling on her right lateral lower leg. She says that she told Dr. Evette Doffing about this and also myself on a couple of occasions but I really have no recollection of this. She is not systemically unwell and her wound really looked good the last time I saw this. She showed this to providers at dialysis and she was able to verify that she was started on cephalexin today for 5 doses at dialysis. She dialyzes on Tuesday Thursday and Saturday. 10/2; patient is seen in conjunction with HBO. The area that is draining on the right anterior medial tibia is more extensive. Copious amounts of serosanguineous drainage with some purulence. We are still using the wound VAC on the original wound then it is stable. Culture I did of the original IandD showed MRSA I contacted dialysis she is now on vancomycin with dialysis treatments. I asked them to run a month 10/9; patient seen in  conjunction with HBO. She had a new spontaneous open area just above the wound on the right medial tibia ankle. More swelling on the right medial tibia. Her wound on the foot looks about the same perhaps slightly better. There is no warmth spreading up her leg but no obvious erythema. her MRI of the foot and ankle and distal tib-fib is not booked for next Friday I discussed this with her in great detail over multiple days. it is likely she has spreading infection upper leg at least involving the distal 25% above the ankle. She knows that if I refer her to orthopedics for infectious disease they are going to recommend amputation and indeed I am not against this myself. We had a good trial at trying to heal the foot which is what she wanted along with antibiotics debridement and HBO however she clearly has spreading infection [probably staph aureus/MRSA]. Nevertheless she once again tells me she wants to wait the left of the MRI. She still makes comments about having her amputation done in [place]. 10/19; arrives today with significant swelling on the lateral right leg. Last culture I did showed Klebsiella. Multidrug-resistant. Cipro was intermediate sensitivity and that is what I have her on pending her MRI which apparently is going to be done on Thursday this week although this seems to be moving back and forth. She is not systemically unwell. We are using silver alginate on her major wound area on the right medial foot and the draining areas on the right lateral lower leg 10/26; MRI showed extensive abscess in the anterior compartment of the right leg also widespread osteomyelitis involving osseous structures of the midfoot and portions of the hindfoot. Also suspicion for osteomyelitis anterior aspect of the distal medial malleolus. Culture I did of the purulence once again showed a multidrug-resistant Klebsiella. I have been  in contact with nephrology late last week and she has been started on  cefepime at dialysis to replace the vancomycin We sent a copy of her MRI report to Dr. Geroge Baseman in [place] who is an orthopedic surgeon. The patient takes great stock in his opinion on this. She says she will go to [place] to have her leg amputated if Dr. Geroge Baseman does not feel there is any salvage options. 11/2; she still is not talk to her orthopedic surgeon in [place]. Apparently he will call her at [phone] this afternoon. The quality of this is she has not allowed me to refer her anywhere. She has been told over and over that she needs this amputated but has not agreed to be referred. She tells me her blood sugar was 600 last night but she has not been febrile. 11/9; she never did got a call from the orthopedic surgeon in [place] therefore that is off the radar. We have arranged to get her see orthopedic surgery at [facility]. She still has a lot of draining purulence coming out of the new abscess in her right leg although that probably came from the osteomyelitis in her right foot and heel. Meanwhile the original wound on the right foot looks very healthy. Continued improvement. The issue is that the last MRI showed osteomyelitis in her right foot extensively she now has an abscess in the right anterior lower leg. There is nobody in [place] who will offer this woman anything but an amputation and to be honest that is probably what she needs. I think she still wants to talk about limb salvage although at this point I just do not see that. She has completed her vancomycin at dialysis which was for the original staph aureus she is still on cefepime for the more recent Klebsiella. She has had a long course of both of these antibiotics which should have benefited the osteomyelitis on the right foot as well as the abscess. 11/16; apparently [place] elective surgery is shut down because of COVID-19 pandemic. I have reached out to some contacts at [facility] to see if we can get her an  orthopedic appointment there. I am concerned about continually leaving this but for the moment everything is static. In fact her original large wound on this foot is closing down. It is the abscess on the right anterior leg that continues to drain purulent serosanguineous material. She is not currently on any antibiotics however she had a prolonged course of vancomycin [1 month] as well as cefepime for a month 02/24/2019 on evaluation today patient appears to be doing better than the last time I saw her. This is not a patient that I typically see. With that being said I am covering for Dr. Dellia Nims this week and again compared to when I last saw her overall the wounds in particular seem to be doing significantly better which is good news. With that being said the patient tells me several disconcerting things. She has not been able to get in to see anyone for potential debridement in regard to her leg wounds although she tells me that she does not think it is necessary any longer because she is taking care of that herself. She noticed a string coming out of the lower wound on her leg over the last week. The patient states that she subsequently decided that we must of pack something in there and started pulling the string out and as it kept coming and coming she realized this was likely her  tendon. With that being said she continued to remove as much of this as she could. She then I subsequently proceeded to using tubes of antibiotic ointment which she will stick down into the wound and then scored as much as she can until she sees it coming out of the other wound opening. She states that in doing this she is actually made things better and there is less redness and irritation. With regard to her foot wound she does have some necrotic tendon and tissue noted in one small corner but again the actual wound itself seems to be doing better with good granulation in general compared to my last evaluation. 12/7;  continued improvement in the wound on the substantial part of the right medial foot. Still a necrotic area inferiorly that required debridement but the rest of this looks very healthy and is contracting. She has 2 wounds on the right lateral leg which were her original drainage sites from her abscess but all of this looks a lot better as well. She has been using silver alginate after putting antibiotic biotic ointment in one wound and watching it come out the other. I have talked to her in some detail today. I had given her names of orthopedic surgeons at [facility] for second opinion on what to do about the right leg. I do not think the patient never called them. She has not been able to get a hold of the orthopedic surgeon in [place] that she had put a lot of faith in as being somebody would give her an opinion that she would trust. I talked to her today and said even if I could get her in to another orthopedic surgeon about the leg which she accept an amputation and she said she would not therefore I am not going to press this issue for the moment 12/14; continued improvement in his substantial wound on the right medial foot. There is still a necrotic area inferiorly with tightly adherent necrotic debris which I have been working on debriding each time she is here. She does not have an orthopedic appointment. Since last time she was here I looked over her cultures which were essentially MRSA on the foot wound and gram-negative rods in the abscess on the anterior leg. 12/21; continued improvement in the area on the right medial foot. She is not up on this much and that is probably a good thing since I do not know it could support continuous ambulation. She has a small area on the right lateral leg which were remanence of the IandD's I did because of the abscess. I think she should probably have prophylactic antibiotics I am going to have to look this over to see if we can make an intelligent  decision here. In the meantime her major wound is come down nicely. Necrotic area inferiorly is still there but looks a lot better 04/06/2019; she has had some improvement in the overall surface area on the right medial foot somewhat narrowedr both but somewhat longer. The areas on the right lateral leg which were initial IandD sites are superficial. Nothing is present on the right heel. We are using silver alginate to the wound areas 1/18; right medial foot somewhat smaller. Still a deep probing area in the most distal recess of the wound. She has nothing open on the right leg. She has a new wound on the plantar aspect of her left fourth toe which may have come from just pulling skin. The patient using Medihoney on  the wound on her foot under silver alginate. I cannot discourage her from this 2/1; 2-week follow-up using silver alginate on the right foot and her left fourth toe. The area on the right dorsal foot is contracted although there is still the deep area in the most distal part of the wound but still has some probing depth. No overt infection 2/15; 2-week follow-up. She continues to have improvement in the surface area on the dorsal right foot. Even the tunneling area from last time is almost closed. The area that was on the plantar part of her left fourth toe over the PIP is indeed closed 3/1; 2-week follow-up. Continued improvement in surface area. The original divot that we have been debriding inferiorly I think has full epithelialization although the epithelialization is gone down into the wound with probably 4 mm of depth. Even under intense illumination I am unable to see anything open here. The remanence of the wound in this area actually look quite healthy. We have been using silver alginate 3/15; 2-week follow-up. Unfortunately not as good today. She has a comma shaped wound on the dorsal foot however the upper part of this is larger. Under illumination debris on the surface She also  tells [place] that she was on her right leg 2 times in the last couple of weeks mostly to reach up for things above her head etc. She felt a sharp pain in the right leg which she thinks is somewhere from the ankle to the knee. The patient has neuropathy and is really uncertain. She cannot feel her foot so she does not think it was coming from there 3/29; 2-week follow-up. Her wound measures smaller. Surface of the wound appears reasonable. She is using silver alginate with underlying Medihoney. She has home health. X-rays I did of her tib-fib last time were negative although it did show arterial calcification 4/12; 2-week follow-up. Her wound measures smaller in length. Using manuka honey with silver alginate on top. She has home health. 4/26; 2-week follow-up. Her wound is smaller but still very adherent debris under illumination requiring debridement she has been using manuka honey with silver alginate. 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 Been using Hydrofera Blue. She is on suppressive Bactrim but apparently she has been taking it twice daily 6/7; I have not seen this wound and about 6 weeks. Since then she was up in [place]. By her own admission she was walking on the foot because she did not have a wheelchair. The wound is not nearly as healthy looking as it was the last time I saw this. We ordered different things for her but she only uses Medihoney and silver alginate. As far as I know she is on suppressive trimethoprim sulfamethoxazole. She does not admit to any fever or chills. Her CBGs apparently are at baseline however she is saying that she feels some discomfort on the lateral part of her ankle I looked over her last inflammatory markers from the summer 2020 at which time she had a deeply necrotic infected wound in this area. On 11/10/2018 her sedimentation rate was 56 and C-reactive  protein 9.9. This was 107 and 29 on 07/29/2018. 6/17; the patient had a necrotic wound the last time she was here on the right dorsal foot. After debridement I did a culture. This showed a very resistant ESBL Klebsiella as well as Enterococcus. Her  x-ray of the foot which was done because of warmth and some discomfort showed bone destruction within the carpal bones involving the navicular acute cuboid lateral middle cuneiforms but essentially unchanged from her prior study which was done on 10/29/2018. The findings were felt to represent chronic osteomyelitis. We did inflammatory markers on her. Her white count was 5.25 sedimentation rate 16 and C-reactive protein at 11.1. Notable for the fact that in August 2020 her CRP was 9.9 and sedimentation rate 56. I have looked at her x-rays. It is true that the bone destruction is very impressive however the patient came into this clinic for the wound on her right foot with pieces of bone literally falling out anteriorly with purulent material. I am not exactly sure I could have expected anything different. She has not been systemically unwell no fever chills or blood sugars have been reasonable. 6/28; she arrives with a right heel closed. The substantial area on the right anterior foot looks healthy. Much better looking surface. I think we can change to [facility] seems to help this previously. She is getting her antibiotics at dialysis she should be just about finished 7/9; changed to [facility] last week. Surface wound looks satisfactory not much change in surface area however. She is going to [place] state next week this is usually a difficult thing for this patient follow-up will be for 2 weeks. 7/23; using Hydrofera Blue. She returns from her trip and the wound looks surprisingly good. Usually when this patient goes on trips she comes back with a lot of problems with the wound. She is saying that she sometimes feels an episodic "crunching"  feeling on the lateral part of the foot. She is neuropathic and not feeling pain but wonders whether this could be a neuropathic dysesthesia. 11/13/19-Patient returns after 3 weeks, the wound itself is stable and patient states that there is nothing new going on she is on some extra anxiety medications and is resisting the temptation to pick at the dry skin around the wound. 9/20; patient has not been here in over a month and I have not seen her in 2 months. The wound in terms of size I think is about the same. There is no exposed bone. She has a nonviable surface on this. She is supposed to be using [facility] however she is also been using some form of honey preparation as well as a silver-based dressings. I do not think she has any pattern to this. 10/4; 2-week follow-up. Patient has been using some form of spray which she says has honey and silver to purchase this online she has been covering it with gauze. In spite of this the wound actually looks quite good. The deeper divot distally appears to be close down. There is a rim of epithelialization. 10/18; 2-week follow-up. Patient has been using her Hydrofera Blue covered with her silver honey spray that she got online. 11/1; 2-week follow-up. She is using Hydrofera Blue with a silver honey spray. Wound bed is measuring smaller. She has noticed that her foot is warmer on the right. She is concerned about infection. For a long period of time I had her on prophylactic trimethoprim sulfamethoxazole DS 1 tablet daily. She is asking for this to be restarted. The patient is walking on this foot because of repairs that are being done in a home her but her room is on the second floor she has to go up and down stairs. I have cautioned against this however as  usual she will do exactly what she wants to do 11/15; 2-week follow-up. She uses Hydrofera Blue with a silver/honey spray which I have never heard of. I think her wound looks about the same.  Some epithelialization. No evidence that this is infected. I think she is walking on this more than we agreed on. She is going on extensive vacation over Thanksgiving 12/3; 2-week follow-up. She is using Hydrofera Blue however over Thanksgiving she ran out of this and she is simply been using Medihoney. In spite of this her wound is smaller almost divided into 2 now. She traveled extensively over Thanksgiving and actually looks quite good in spite of this. Usually this is been a marker of problems for her 12/17; 2-week follow-up. She is using Hydrofera Blue. The wound is smaller. Debris on the surface of this is fibrinous. She is traveling to [place] over the holidays which never bodes well for her wounds. I think she is walking more on her feet then she is even willing to admit and she tells me she does walk 2/11; using Hydrofera Blue. Her wounds are contracting however she walks in the clinic with a history that she has not been able to eat she has had vomiting. She also has right eye problems. Her blood sugar was 567. She had blood work done at dialysis and we called there to get her blood work although it still had not returned although they should have this by the end of the day we were told. She also stated that she was not sure what her blood sugar was as her glucose monitor was not working and not coming to tomorrow. She also for some reason does not think her insulin pump is working well. Her endocrinologist is Dr. Elayne Snare 06/03/2020 upon evaluation today patient actually appears to be doing excellent in regard to her wound. In fact she tells me it was not even bleeding until she picked a dry piece of skin off while we were getting ready to come in and see her today. Fortunately there is no signs of active infection which is great news and overall very pleased. She is going to be seeing her neurologist sometime shortly I believe it might even be on Monday. Nonetheless there can proceed  with the work-up as far as anything else going on with her eye the fact that her right eyelid is drooping. 3/25; right medial dorsal foot. She has superficial areas here that appear to be fully epithelialized she is using Medihoney and silver alginate and some combination. She has a new area on the mid left foot at roughly the level of the fifth metatarsal base 4/84/8; right medial dorsal foot. Still superficial areas here. I cleaned up 1 of these today she has been using I think mostly Medihoney but I would like her to use silver alginate. The area on the mid part of her foot also looks improved on the left. Since she was last here she tells me that she noted pus in the area of her left foot. She told dialysis about this they did a culture and ultimately she is on IV antibiotics but were not sure which one. They referred her to [name] he said that he did not need to follow her. I have not really verified any of this and I do not know what antibiotic she is on at dialysis 4/29; patient has been using Medihoney to her wounds. She reports taking off the toenail to her left second toe.  She states that home health discharged her and she has not had help with dressing changes. She denies any signs and symptoms of infection. 5/13; 2-week follow-up. Miraculously the wound on the right foot dorsally is healed. She has an area on the tip of her left third toe and an area in the left midfoot. 5/26; patient presents for 2-week follow-up. She has 2 open wounds 1 to her plantar foot and the other to the left third toe. She has noticed increased swelling and redness to the toe wound. It is unclear exactly what she is doing for dressing changes as she has multiple dressing options at home. She states that it is easiest to do [facility] on the plantar wound and Prisma or Medihoney on the toe wound. 6/3; saw Dr. Maryjane Hurter week. She put her on Keflex because of the swelling I think of the left second toe. She  arrives today with new breakdown x3 on the dorsal right foot which is disappointing. She also complains of pain in the right ankle, 3 liquid stools per day, chills without fever 6/17; patient was in [place]. I think a little more ambulatory than I would like to hear. In any case she still has the 3 small open areas on the dorsal part of her foot on the right medial leg these look about the same as last time. She is supposed to be using Prisma but I think used Medihoney. She has an area on the left lateral plantar foot which had undermining under her skin and a blood blister with the 2 areas communicating today. And finally an area on the tip of her left second toe 6/30; patient has 3 small areas that look better than the last time on the right dorsal foot. She uses a combination of her products including collagen, Hydrofera Blue and the Medihoney nevertheless they look better. The area on the left midfoot might have been healed although she pulled some skin off superficial open area. The left second toe still does not look that healthy. Open areas on the tips of the 7/21; the areas on her right dorsal foot are still present although they look benign. We are using silver collagen although she may be using any of the products she had at home. The real problem here is the left second toe. Necrotic wound over the distal phalanx and distal interphalangeal joint. This probes directly to bone. She arrives in clinic today with an interesting story. She went to [place] last week. She became ill there with refractory nausea and vomiting. She went to an urgent care and then the ER. I am able to see parts of this in Care Everywhere. She had 2 blood cultures done both of which ultimately showed MRSA. This was sensitive to daptomycin and linezolid and vancomycin. Her white count was 4.8 hemoglobin 10.4. X-ray of the left foot showed prior resection of the left fifth metatarsal no evidence of osteomyelitis. Her  C-reactive protein on 10/13/2020 was 104.8. She refused admission to the hospital as she was to board her flight to return to [place]. She dialyzed this morning I am not certain why she did not bring this to their attention 7/27; fortunately the left second toe and the erythema in her left foot looks a lot better on the IV vancomycin at dialysis. I was able to verify earlier this week that she had indeed received it on the weekend starting last Saturday. She will need at least 4 weeks. I saw her left  arm shunt and it looks fine. We are using silver alginate on the toe and we have been using I think Medihoney on the right foot which is actually been her decision. She has 2 open wounds dorsally. She does not describe fever chills her blood sugars are erratic but seem to be at her baseline 8/18; she has completed her antibiotics at dialysis but is unsure how long this was ordered. I have asked her to check when she goes to dialysis on Saturday. She is using polymen on the right foot dorsally and silver collagen not silver alginate to the left second toe. Her blood pressure is high today but blood sugars have been stable according to her she is having no fever or systemic symptoms Electronic Signature(s) Signed: 11/17/2020 4:52:40 PM By: Linton Ham MD Entered By: Linton Ham on 11/17/2020 15:58:29 -------------------------------------------------------------------------------- Physical Exam Details Patient Name: Date of Service: Nancy [name] 11/17/2020 2:00 PM Medical Record Number: [id number] Patient Account Number: [id number] Date of Birth/Sex: Treating RN: [birth year] (50 y.o. [name] Primary Care Provider: [name], [name] Other Clinician: Referring Provider: Treating Provider/Extender: [name], [name] Weeks in Treatment: 115 Constitutional Patient is hypertensive.. Pulse regular and within target range for patient.Marland Kitchen Respirations regular,  non-labored and within target range.. Temperature is normal and within the target range for the patient.Marland Kitchen Appears in no distress. Cardiovascular Dorsalis pedis pulses are palpable bilaterally. Notes Wound exam; left plantar foot remains healed she has an area on her left second toe dorsally replacing the nail. This does not look infected The 2 areas on her right dorsal foot look healthy to me. No evidence of surrounding infection Electronic Signature(s) Signed: 11/17/2020 4:52:40 PM By: Linton Ham MD Entered By: Linton Ham on 11/17/2020 16:01:58 -------------------------------------------------------------------------------- Physician Orders Details Patient Name: Date of Service: Nancy LLO [name] 11/17/2020 2:00 PM Medical Record Number: [id number] Patient Account Number: [id number] Date of Birth/Sex: Treating RN: 07-06-1970 (50 y.o. [name] Primary Care Provider: [name], [name] Other Clinician: Referring Provider: Treating Provider/Extender: [name], [name] Weeks in Treatment: [phone] Verbal / Phone Orders: No Diagnosis Coding ICD-10 Coding Code Description L97.514 Non-pressure chronic ulcer of other part of right foot with necrosis of bone E10.621 Type 1 diabetes mellitus with foot ulcer L97.529 Non-pressure chronic ulcer of other part of left foot with unspecified severity M86.172 Other acute osteomyelitis, left ankle and foot Follow-up Appointments ppointment in 2 weeks. - with Dr. Dellia Nims Return A Bathing/ Shower/ Hygiene May shower and wash wound with soap and water. Edema Control - Lymphedema / SCD / Other Bilateral Lower Extremities Elevate legs to the level of the heart or above for 30 minutes daily and/or when sitting, a frequency of: - throughout the day Avoid standing for long periods of time. Moisturize legs daily. - with dressing changes Off-Loading Open toe surgical [name] to: - to both feet Other: - minimal weight bearing  right foot Wound Treatment Wound #51 - T Second oe Wound Laterality: Left Cleanser: Soap and Water Every Other Day/30 Days Discharge Instructions: May shower and wash wound with dial antibacterial soap and water prior to dressing change. Prim Dressing: Promogran Prisma Matrix, 4.34 (sq in) (silver collagen) Every Other Day/30 Days ary Discharge Instructions: Moisten collagen with saline or hydrogel Secondary Dressing: Woven Gauze Sponge, Non-Sterile 4x4 in (Generic) Every Other Day/30 Days Discharge Instructions: Apply over primary dressing as directed. Secured With: Child psychotherapist,  Sterile 2x75 (in/in) Every Other Day/30 Days Discharge Instructions: Secure with stretch gauze as directed. Secured With: Paper Tape, 1x10 (in/yd) Every Other Day/30 Days Discharge Instructions: Secure dressing with tape as directed. Wound #52 - Foot Wound Laterality: Right Cleanser: Soap and Water Every Other Day/30 Days Discharge Instructions: May shower and wash wound with dial antibacterial soap and water prior to dressing change. Prim Dressing: PolyMem Non-Adhesive Dressing, 4x4 in (Generic) Every Other Day/30 Days ary Discharge Instructions: Apply to wound bed as instructed Secondary Dressing: Woven Gauze Sponge, Non-Sterile 4x4 in (Generic) Every Other Day/30 Days Discharge Instructions: Apply over primary dressing as directed. Secured With: Elastic Bandage 4 inch (ACE bandage) Every Other Day/30 Days Discharge Instructions: Secure with ACE bandage as directed. Secured With: Child psychotherapist, Sterile 2x75 (in/in) Every Other Day/30 Days Discharge Instructions: Secure with stretch gauze as directed. Secured With: Paper Tape, 1x10 (in/yd) Every Other Day/30 Days Discharge Instructions: Secure dressing with tape as directed. Electronic Signature(s) Signed: 11/17/2020 4:52:40 PM By: Linton Ham MD Signed: 11/17/2020 6:20:17 PM By: [name] RN, BSN Entered By:  [name] on 11/17/2020 15:53:26 -------------------------------------------------------------------------------- Problem List Details Patient Name: Date of Service: Nancy LLO [name] 11/17/2020 2:00 PM Medical Record Number: [id number] Patient Account Number: [id number] Date of Birth/Sex: Treating RN: 1971-01-29 (50 y.o. [name] Primary Care Provider: [name], [name] Other Clinician: Referring Provider: Treating Provider/Extender: [name], [name] Weeks in Treatment: 115 Active Problems ICD-10 Encounter Code Description Active Date MDM Diagnosis L97.514 Non-pressure chronic ulcer of other part of right foot with necrosis of bone 09/03/2018 No Yes E10.621 Type 1 diabetes mellitus with foot ulcer 09/24/2018 No Yes L97.529 Non-pressure chronic ulcer of other part of left foot with unspecified severity 07/29/2020 No Yes M86.172 Other acute osteomyelitis, left ankle and foot 10/20/2020 No Yes Inactive Problems ICD-10 Code Description Active Date Inactive Date M86.671 Other chronic osteomyelitis, right ankle and foot 09/03/2018 09/03/2018 L97.411 Non-pressure chronic ulcer of right heel and midfoot limited to breakdown of skin 09/17/2019 09/17/2019 L97.521 Non-pressure chronic ulcer of other part of left foot limited to breakdown of skin 04/20/2019 04/20/2019 L97.812 Non-pressure chronic ulcer of other part of right lower leg with fat layer exposed 02/24/2019 02/24/2019 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 11/17/2020 2:00 PM Medical Record Number: [id number] Patient Account Number: [id number] Date of Birth/Sex: Treating RN: 11/23/1970 (50 y.o. [name] Primary Care Provider: [name], [name] Other Clinician: Referring Provider: Treating Provider/Extender: [name], [name] Weeks in Treatment: 115 Subjective History of Present Illness (HPI) When52 year old diabetic who is known to have type 1 diabetes which is poorly controlled last hemoglobin A1c was 11%. She comes in with a ulcerated area on the left lateral foot which has been there for over 6 months. Was recently she has been treated by [name] of podiatry who saw her last on 05/28/2016. Review of his notes revealed that the patient had incision and drainage with placement of antibiotic beads to the left foot on 04/11/2016 for possible osteomyelitis of  the cuboid bone. Over the last year she's had a history of amputation of the left fifth toe and a femoropopliteal popliteal bypass graft somewhere in April 2017. 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 His note [name] mentions that the patient has been referred to me for further wound care and possibly great candidate for hyperbaric oxygen therapy due to recurrent osteomyelitis. However we do not have any x-rays of biopsy reports confirming this. He has been on several antibiotics including Bactrim and most recently is on doxycycline for an MRSA. I understand, the patient was not a candidate for IV antibiotics as she has had previous PICC lines which resulted in blood clots in both arms. There was a x-ray report dated 04/04/2016 on [name] notes which showed evidence of fifth ray resection left foot with osteolytic changes noted to the fourth metatarsal and cuboid bone on the  left. 06/13/2016 -- had a left foot x-ray which showed no acute fracture or dislocation and no definite radiographic evidence of osteomyelitis. Advanced osteopenia was seen. 06/20/2016 -- she has noticed a new wound on the right plantar foot in the region where she had a callus before. 06/27/16- the patient did have her x-ray of the right foot which showed no findings to suggest osteomyelitis. She saw her endocrinologist, [name], yesterday. Her A1c in January was 11. He also indicates mismanagement and noncompliance regarding her diabetes. She is currently on Bactrim for a lip infection. She is complaining of nausea, vomiting and diarrhea. She is unable to articulate the exact orders or dosing of the Bactrim; it is unclear when she will complete this. 07/04/2016 -- results from [facility] of ABIs with ankle waveforms were noted from 02/14/2016. The examination done on 06/27/2015 showed noncompressible ABIs with the right being 1.45 and the left being 1.33. The present examination showed a right ABI of 1.19 on the left of 1.33. The conclusion was that right normal ABI in the lower extremity at rest however compared to previous study which was noncompressible ABI may be falsely elevated side suggesting medial calcification. The left ABI suggested medial calcification. 08/01/2016 -- the patient had more redness and pain on her right foot and did not get to come to see as noted she see her PCP or go to the ER and decided to take some leftover metronidazole which she had at home. As usual, the patient does report she feels and is rather noncompliant. 08/08/2016 -- -- x-ray of the right foot -- FINDINGS:Transmetatarsal amputation is noted. No bony destruction is noted to suggest osteomyelitis. IMPRESSION: No evidence of osteomyelitis. Postsurgical changes are seen. MRI would be more sensitive for possible bony changes. Culture has grown Serratia Marcescens -- sensitive to Bactrim, ciprofloxacin,  ceftazidime she was seen by [name] on 08/06/2016. He did not find any exposed bone, muscle, tendon, ligament or joint. There was no malodor and he did a excisional debridement in the office. ============ Old notes: 50 year old patient who is known to the wound clinic for a while had been away from the wound clinic since 49/04/2014. Over the last several months she has been admitted to various hospitals including [place] [facility] [place]. She was treated for a right metatarsal osteomyelitis with a transmetatarsal amputation and this was done about 2 months ago. He has a small ulcerated area on the right heel and she continues to have an ulcerated area on the left plantar aspect of the foot. The patient was recently admitted to the [facility] hospital group  between 49/12 and 10/18/2014. she was given 3 weeks of IV vancomycin and was to follow-up with her surgeons at [facility] and also took oral vancomycin for C. difficile colitis. 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 MRI of the left foot done on 10/14/2014 showed no abscess or osteomyelitis. 49/25/17; this is a patient we know from previous stays in the wound care center. She is a type I diabetic I am not sure of her control currently. Since the last time I saw her she is had a right transmetatarsal amputation and has no wounds on her right foot and has no open wounds. She is been followed at the wound care center at [facility] in [place]. She comes today with the desire to undergo hyperbaric treatment locally. Apparently one of her wound care providers in [place] has suggested hyperbarics. This is in response to an MRI from 04/18/15 that showed  increased marrow signal and loss of the proximal fifth metatarsal cortex evidence of osteomyelitis with likely early osteomyelitis in the cuboid bone as well. She has a large wound over the base of the fifth metatarsal. She also has a eschar over her the tips of her toes on 1,3 and 5. She does not have peripheral pulses and apparently is going for an angiogram tomorrow which seems reasonable. After this she is going to infectious disease at [facility]. They have been using Medihoney to the large wound on the lateral aspect of the left foot to. The patient has known Charcot deformity from diabetic neuropathy. She also has known diabetic PAD. Surprisingly I can't see that she has had any recent antibiotics, the patient states the last antibiotic she had was at the end of November for 10 days. I think this was in response to culture that showed group G strep although I'm not exactly sure where the culture was from. She is also had arterial studies on 03/29/15. This showed a right ABI of 1.4 that was noncompressible. Her left ABI was 0.73. 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 It was not felt that arterial inflow was adequate for healing of a foot ulcer. Her Doppler waveforms looked monophasic ===== READMISSION 02/28/17; this is in an now 50 year old woman we've had at several different occasions in this clinic. 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 She has a remote ex-smoker. She was last seen in this clinic by Dr. Con Memos I think in May. More recently she is been followed by her podiatrist [name] an infectious disease Dr. Megan Salon. She has 2 open wounds the major one is over the right first metatarsal head she also has a wound on the left plantar foot. an MRI of the right foot on 01/01/17 showed a soft tissue ulcer along the plantar aspect of the first metatarsal base consistent with osteomyelitis of the first metatarsal  stump. Dr. 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 According to the patient this is been open for slightly over a month. She has been on a combination of Cipro 500 twice a day, Zyvox 600 twice a day and Flagyl 500 3 times a day for over a month now as directed by Dr. Megan Salon. cultures of the  right foot earlier this year showed MRSA in January and Serratia in May. January also had a few viridans strep. Recent x-rays of both feet were done and [name] office and I don't have these reports. The patient has known PAD and has a history of aleft femoropopliteal bypass in April 2017. 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 Dwyane Dee. In spite of this the last hemoglobin A1c I can see is 10.1 on 01/01/2017. She is being referred by 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 She is been using Medihoney to this area and also an area on the left midfoot. She is using healing sandals bilaterally. ABIs in this clinic at the left posterior tibial was 1.1 noncompressible on the right READMISSION Non invasive vascular NOVANT 5/18 Aftercare following surgery of the circulatory system Procedure Note - Interface, External Ris In - 08/13/2016 11:05 AM EDT Procedure: Examination consists of physiologic resting arterial pressures of the brachial and ankle arteries bilaterally with continuous wave Doppler waveform analysis. Previous: Previous exam performed on 02/14/16 demonstrated ABIs of Rt = 1.19 and Lt = 1.33. Right: ABI = non-compressible PT 1.47 DP. S/P transmet amputation. , Left: ABI = 1.52, 2nd digit pressure = 87 mmHg Conclusions: Right: ABI (>1.3) may be falsely elevated, suggesting medial  calcification. Left: ABI (>1.3) may be falsely elevated, suggesting medial calcification The patient is a now 50 year old type I diabetic is had multiple issues her graded to chronic diabetic foot ulcers. 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 We had her in the clinic lastin November. At that point she had wounds on her bilateral feet.she had wanted to try hyperbarics however the healogics review process denied her because she hadn't followed up with her vascular surgeon for her left femoropopliteal bypass. The bypass was done by Dr. Raul Del at [facility]. We made her a follow-up with Dr. Raul Del however she did not keep the appointment and therefore she was not approved The patient shows me a small wound on her left fourth metatarsal head on her phone. She developed rapid discoloration in the plantar aspect of the left foot and she was admitted to hospital from 2/2 through 05/10/17 with wet gangrene of the left foot osteomyelitis of the fourth metatarsal heads. She was admitted acutely ill with a temperature of 103. She was started on broad-spectrum vancomycin and cefepime. On 05/06/17 she was taken to the OR by [name] her podiatric surgeon for an incision and drainage irrigation of the left foot wound. Cultures from this surgery revealed group be strep and anaerobes. she was seen by Dr.Xu of orthopedic surgery and scheduled for a below-knee amputation which she u refused. Ultimately she was discharged on Levaquin and Flagyl for one month. MRI 05/05/17 done while she was in the hospital showed abscess adjacent to the fourth metatarsal head and neck small abscess around the fourth flexor tendon. Inflammatory phlegmon and gas in the soft tissues along the lateral aspect of the fourth phalanx. Findings worrisome for osteomyelitis involving the fourth proximal and middle phalanx and also the third and fourth  metatarsals. Finally the patient had actually shortly before this followed up with Dr. Raul Del at no time on 04/29/17. He felt that her left  femoropopliteal bypass was patent he felt that her left-sided toe pressures more than adequate for healing a wound on the left foot. This was before her acute presentation. 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 05/28/17; she is started hyperbarics. The patient tells me that for some reason she was not actually on Levaquin but I think on ciprofloxacin. She was on Flagyl. She only started her Levaquin yesterday due to some difficulty with the pharmacy and perhaps her sister picking it up. She has an appointment with [name] tomorrow and with infectious disease early next week. She has no new complaints 06/06/17; the patient continues in hyperbarics. She saw [name] on 05/29/17 who is her podiatric surgeon. He is elected for a transmetatarsal amputation on 06/27/17. I'm not sure at what level he plans to do this amputation. The patient is unaware ooShe also saw Dr. Megan Salon of infectious disease who elected to continue her on current antibiotics I think this is ciprofloxacin and Flagyl. I'll need to clarify with her tomorrow if she actually has this. We're using silver alginate to the actual wound. Necrotic surface today with material under the flap of her foot. ooOriginal MRI showed abscesses as well as osteomyelitis of the proximal and middle fourth phalanx and the third and fourth metatarsal heads 06/11/17; patient continues in hyperbarics and continues on oral antibiotics. She is doing well. The wound looks better. The necrotic part of this under the flap in her superior foot also looks better. she is been to see [name]. I haven't had a chance to look at his note. Apparently he has put the transmetatarsal amputation on hold her request it is still planning to take her to the OR for debridement and product application ACEL. I'll see if I can find his note.  I'll therefore leave product ordering/requests to [name] for now. I was going to look at Dermagraft 06/18/17-she is here in follow-up evaluation for bilateral foot wounds. She continues with hyperbaric therapy. She states she has been applying manuka honey to the right plantar foot and alternate manuka honey and silver alginate to the left foot, despite our orders. We will continue with same treatment plan and she will follo up next week. 06/25/17; I have reviewed [name] last note from 3/11. She has operative debridement in 2 days' time. By review his note apparently they're going to place there is skin over the majority of this wound which is a good choice. She has a small satellite area at the most proximal part of this wound on the left plantar foot. The area on the right plantar foot we've been using silver alginate and it is close to healing. 07/02/17; unfortunately the patient was not easily approved for [name] proposed surgery. I'm not completely certain what the issue is. She has been using silver alginate to the wound she has completed a first course of hyperbarics. She is still on Levaquin and Flagyl. I have really lost track of the time course here.I suspect she should have another week to 2 of antibiotics. I'll need to see if she is followed up with infectious disease Dr. Megan Salon 07/09/17; the patient is followed up with Dr. Megan Salon. 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 She continues on Levaquin and metronidazole continuing both of these for now I think she is been on fr about 6 weeks. She still has some drainage but no pain. No fever. Her had been plans for her to  go to the OR for operative debridement with her podiatrist [name], I am not exactly sure where that is. I'll probably slip a note to [name] today. I note that she follows with Dr. Dwyane Dee of endocrinology. We have her recertified for hyperbaric oxygen. I have not heard  about Dermagraft however I'll see if [name] is planning a skin substitute as well 07/16/17; the patient tells me she is just about out of [place]. I'll need to check Dr. Hale Bogus last notes on this. She states she has plenty of Flagyl however. She comes in today complaining of pain in the right lateral foot which she said lasted for about a day. The wound on the right foot is actually much more medially. She also tells me that the [facility] cost a lot of pain in the left foot wound and she turned back to silver alginate. Finally Dermagraft has a $921 per application co-pay. She cannot afford this 07/23/17; patient arrives today with the wound not much smaller. There is not much new to add. She has not heard from [name] all try to put in a call to them today. She was asking about Dermagraft again and she has an over $194 per application co-pay she states that she would be willing to try to do a payment plan. I been tried to avoid this. We've been using silver alginate, I'll change to [facility] 07/30/17-She is here in follow-up evaluation for left foot ulcer. She continues hyperbaric medicine. The left foot ulcer is stable we will continue with same treatment plan 08/06/17; she is here for evaluation of her left foot ulcer. Currently being treated for hyperbarics or underlying osteomyelitis. She is completed antibiotics. The left foot ulcer is better smaller with healthier looking granulation. For various reasons I am not really clear on we never got her back to the OR with [name]. He did not respond to my secure text message. Nevertheless I think that surgery on this point is not necessary nor am I completely clear that a skin substitute is necessary The patient is complaining about pain on the outside of her right foot. 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 There is no erythema. She also states the foot is warm versus her other part of her upper leg and this is  largely true. It is not totally clear to me what's causing this. She thinks it's different from her usual neuropathy pain 08/13/17; she arrives in clinic today with a small wound which is superficial on her right first metatarsal head. 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 She tells [place] she was up on her feet over the Mother's Day celebration. ooThe large wound is on the left foot. Continues with hyperbarics for underlying osteomyelitis. We're using Hydrofera Blue. She asked me today about where we were with Dermagraft. I had actually excluded this because of the co-pay however she wants to assume this therefore I'll recheck the co-pay an order for next week. 08/20/17; the patient agreed to accept the co-pay of the first Apligraf which we applied today. She is disappointed she is finishing hyperbarics will run this through the insurance on the extent of the foot infection and the extent of the wound that she had however she is already had 60 dive's. Dermagraft No. 1 08/27/17; Dermagraft No. 2. She is not eligible for any more hyperbaric treatments this month. She reports a fair amount of drainage and she actually changed to the external dressings without  disturbing the direct contact layer 09/03/17; the patient arrived in clinic today with the wound superficially looking quite healthy. Nice vibrant red tissue with some advancing epithelialization although not as much adherence of the flap as I might like. However she noted on her own fourth toe some bogginess and she brought that to our attention. Indeed this was boggy feeling like a possibility of subcutaneous fluid. 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 She is not been unwell. We've been using Dermagraft 09/10/17; the culture that I did not last week was MRSA. She saw Dr. Megan Salon this morning who is going to start her on vancomycin. I had sent him a  secure a text message yesterday. I also spoke with her podiatric surgeon [name] about surgery on this foot the options for conserving a functional foot etc. Promised me he would see her and will make back consultation today. Paradoxically her actual wound on the plantar aspect of her left foot looks really quite good. I had given her 5 days worth of Baxdella to cover her for MRSA. Her MRI came back showing osteomyelitis within the third metatarsal shaft and head and base of the third and fourth proximal phalanx. She had extensive inflammatory changes throughout the soft tissue of the lateral forefoot. With an ill-defined fluid around the fourth metatarsal extending into the plantar and dorsal soft tissues 09/19/17; the patient is actually on oral Septra and Flagyl. She apparently refused IV vancomycin. She also saw [name] at my request who is planning her for a left BKA sometime in mid July. MRI showed osteomyelitis within the third metatarsal shaft and head and the basis of the third and fourth proximal phalanx. I believe there was felt to be possible septic arthritis involving the third MTP. 09/26/17; the patient went back to Dr. Megan Salon at my suggestion and is now receiving IV daptomycin. Her wound continues to look quite good making the decision to proceed with a transmetatarsal amputation although more difficult for the patient. I believe in my extensive discussions with her she has a good sense of the pros and cons of this. I don't NV the tuft decision she has to make. She has an appointment with [name] I believe in mid July and I previously spoken to him about this issue Has we had used 3 previous Dermagraft. Given the condition of the wound surface I went ahead and added the fourth one today area and I did this not fully realizing that she'll be traveling to [place] next week. I'm hopeful she can come back in 2 weeks 10/21/17; Her same Dermagraft on for about 3-1/2 weeks. In spite of  this the wound arrives looking quite healthy. There is been a lot of healing dimensions are smaller. Looking at the square shaped wound she has now there is some undermining and some depth medially under the undermining although I cannot palpate any bone. No surrounding infection is obvious. She has difficult questions about how to look at this going forward vis--vis amputations versus continued medical therapy. T be truthful the wound is looks so o healthy and it is continued to contract. Hard to justify foot surgery at this point although I still told her that I think it might come to that if we are not able to eradicate the underlying MRSA. She is still highly at risk and she understands this 11/06/17 on evaluation today patient appears to  be doing better in regard to her foot ulcer. She's been tolerating the dressing changes without complication. Currently she is here for her Dermagraft #6. Her wound continues to make excellent progress at this point. She does not appear to have any evidence of infection which is good news. 11/13/17 on evaluation today patient appears to be doing excellent at this time. She is here for repeat Dermagraft application. This is #7. Overall her wound seems to be making great progress. 12/05/17; the patient arrives with the wound in much better condition than when I last saw this almost 6 weeks ago. She still has a small probing area in the left metatarsal head region on the lateral aspect of her foot. We applied her last Dermagraft today. ooSince the last time she is here she has what appears to a been a blood blister on the plantar aspect of left foot although I don't see this is threatening. There is also a thick raised tissue on the right mid metatarsal head region. This was not there I don't think the last time she was here 3 weeks ago. 12/12/17; the patient continues to have a small programming area in the left metatarsal head region on the lateral aspect of her foot  which was the initial large surgical wound. I applied her last Apligraf last week. I'm going to use Endoform starting today ooUnfortunately she has an excoriated area in the left mid foot and the right mid foot. The left midfoot looks like a blistered area this was not opened last week it certainly is open today. Using silver alginate on these areas. She promises me she is offloading this. 12/19/17; the small probing area in the left metatarsal head eyes think is shallower. In general her original wound looks better. We've been using Endoform. The area inferiorly that I think was trauma last week still requires debridement a lot of nonviable surface which I removed. She still has an open open area distally in her foot ooSimilarly on the right foot there is tightly adherent surface debris which I removed. Still areas that don't look completely epithelialized. This is a small open area. We used silver alginate on these areas 12/26/2017; the patient did not have the supplies we ordered from last week including the Endoform. The original large wound on the left lateral foot looks healthy. She still has the undermining area that is largely unchanged from last week. She has the same heavily callused raised edged wounds on the right mid and left midfoot. Both of these requiring debridement. We have been using silver alginate on these areas 01/02/2018; there is still supply issues. We are going to try to use Prisma but I am not sure she actually got it from what she is saying. She has a new open area on the lateral aspect of the left fourth toe [previous fifth ray amputation]. Still the one tunneling area over the fourth metatarsal head. The area is in the midfoot bilaterally still have thick callus around them. She is concerned about a raised swelling on the lateral aspect of the foot. However she is completely insensate 01/10/2018; we are using Prisma to the wounds on her bilateral feet. Surprisingly the  tunneling area over the left fourth metatarsal head that was part of her original surgery has closed down. She has a small open area remaining on the incision line. 2 open areas in the midfoot. 02/10/2018; the patient arrives back in clinic after a month hiatus. She was traveling to visit family in  [place]. Is fairly clear she was not offloading the areas on her feet. The original wound over the left lateral foot at the level of metatarsal heads is reopened and probes medially by about a centimeter or 2. She notes that a week ago she had purulent drainage come out of an area on the left midfoot. Paradoxically the worst area is actually on the right foot is extensive with purulent drainage. We will use silver alginate today 02/17/2018; the patient has 3 wounds one over the left lateral foot. She still has a small area over the metatarsal heads which is the remnant of her original surgical wound. This has medial probing depth of roughly 1.4 cm somewhat better than last week. The area on the right foot is larger. We have been using silver alginate to all areas. The area on the right foot and left foot that we cultured last week showed both Klebsiella and Proteus. Both of these are quinolone sensitive. The patient put her's self on Bactrim and Flagyl that she had left hanging around from prior antibiotic usages. She was apparently on this last week when she arrived. I did not realize this. Unfortunately the Bactrim will not cover either 1 of these organisms. We will send in Cipro 500 twice daily for a week 03/04/2018; the patient has 2 wounds on the left foot one is the original wound which was a surgical wound for a deep DFU. At one point this had exposed bone. She still has an area over the fourth metatarsal head that probes about 1.4 cm although I think this is better than last week. I been using silver nitrate to try and promote tissue adherence and been using silver alginate here. ooShe also has an  area in the left midfoot. This has some depth but a small linear wound. Still requiring debridement. ooOn the right midfoot is a circular wound. A lot of thick callus around this area. ooWe have been using silver alginate to all wound areas ooShe is completed the ciprofloxacin I gave her 2 weeks ago. 03/11/2018; the patient continues to have 2 open areas on the left foot 1 of which was the original surgical wound for a deep DFU. Only a small probing area remains although this is not much different from last week we have been using silver alginate. The other area is on the midfoot this is smaller linear but still with some depth. We have been using silver alginate here as well ooOn the right foot she has a small circular wound in the mid aspect. This is not much smaller than last time. We have been using silver alginate here as well 03/18/2018; she has 3 wounds on the left foot the original surgical wound, a very superficial wound in the mid aspect and then finally the area in the mid plantar foot. She arrives in today with a very concerning area in the wound in the mid plantar foot which is her most proximal wound. There is undermining here of roughly 1-1/2 cm superiorly. Serosanguineous drainage. She tells me she had some pain on for over the weekend that shot up her foot into her thigh and she tells me that she had a nodule in the groin area. ooShe has the single wound in the right foot. ooWe are using endoform to both wound areas 03/24/2018; the patient arrives with the original surgical wound in the area on the left midfoot about the same as last week. There is a collection of fluid under the surface  of the skin extending from the surgical wound towards the midfoot although it does not reach the midfoot wound. The area on the right foot is about the same. Cultures from last week of the left midfoot wound showed abundant Klebsiella abundant Enterococcus faecalis and moderate methicillin  resistant staph I gave her Levaquin but this would have only covered the Klebsiella. She will need linezolid 04/01/2018; she is taking linezolid but for the first few days only took 1 a day. I have advised her to finish this at twice daily dosing. In any case all of her wounds are a lot better especially on the left foot. The original surgical wound is closed. The area on the left midfoot considerably smaller. The area on the right foot also smaller. 04/08/2018; her original surgical wound/osteomyelitis on the left foot remains closed. She has area on the left foot that is in the midfoot area but she had some streaking towards this. This is not connected with her original wound at least not visually. ooSmall wound on the right midfoot appears somewhat smaller. 04/15/18; both wounds looks better. Original wound is better left midfoot. Using silver alginate 1/21; patient states she uses saltwater soak in, stones or remove callus from around her wounds. She is also concerned about a blood blister she had on the left foot but it simply resolved on its own. We've been using silver alginate 1/28; the patient arrives today with the same streaking area from her metatarsals laterally [the site of her original surgical wound] down to the middle of her foot. There is some drainage in the subcutaneous area here. This concerns me that there is actually continued ongoing infection in the metatarsals probably the fourth and third. This fixates an MRI of the foot without contrast [chronic renal failure] ooThe wound in the mid part of the foot is small but I wonder whether this area actually connects with the more distal foot. ooThe area on the right midfoot is probably about the same. Callus thick skin around the small wound which I removed with a curette we have been using silver alginate on both wound areas 2/4; culture I did of the draining site on the left foot last time grew methicillin sensitive staph  aureus. MRI of the left foot showed interval resolution of the findings surrounding the third metatarsal joint on the prior study consistent with treated osteomyelitis. Chronic soft tissue ulceration in the plantar and lateral aspect of the forefoot without residual focal fluid collection. No evidence of recurrent osteomyelitis. 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 She tells me she had nausea and vomiting over the weekend and missed 2 days. In general her foot looks much the same. She has a small open area just below the left fourth metatarsal head. A linear area in the left midfoot. Some discoloration extending from the inferior part of this into the left lateral foot although this appears to be superficial. She has a small area on the right midfoot which generally looks smaller after debridement 2/18; the patient is completing his cephalexin and has another 2 days. She continues to have open areas on the left and right foot. 2/25; she is now off antibiotics. The area on the left foot at the site of her original  surgical wound has closed yet again. She still has open areas in the mid part of her foot however these appear smaller. The area on the right mid foot looks about the same. We have been using silver alginate She tells me she had a serious hypoglycemic spell at home. She had to have EMS called and get IV dextrose 3/3; disappointing on the left lateral foot large area of necrotic tissue surrounding the linear area. This appears to track up towards the same original surgical wound. Required extensive debridement. The area on the right plantar foot is not a lot better also using silver 3/12; the culture I did last time showed abundant enterococcus. I  have prescribed Augmentin, should cover any unrecognized anaerobes as well. In addition there were a few MRSA and Serratia that would not be well covered although I did not want to give her multiple antibiotics. She comes in today with a new wound in the right midfoot this is not connected with the original wound over her MTP a lot of thick callus tissue around both wounds but once again she said she is not walking on these areas 3/17-Patient comes in for follow-up on the bilateral plantar wounds, the right midfoot and the left plantar wound. Both these are heavily callused surrounding the wounds. We are continuing to use silver alginate, she is compliant with offloading and states she uses a wheelchair fairly often at home 3/24; both wound areas have thick callus. However things actually look quite a bit better here for the majority of her left foot and the right foot. 3/31; patient continues to have thick callused somewhat irritated looking tissue around the wounds which individually are fairly superficial. There is no evidence of surrounding infection. We have been using silver alginate however I change that to [facility] today 4/17; patient returns to clinic after having a scare with Covid she tested negative in her primary doctor's office. She has been using Hydrofera Blue. She does not have an open area on the right foot. On the left foot she has a small open area with the mid area not completely viable. She showed me pictures of what looks like a hemorrhagic blister from several days ago but that seems to have healed over this was on the lateral left foot 4/21; patient comes in to clinic with both her wounds on her feet closed. However over the weekend she started having pain in her right foot and leg up into the thigh. She felt as though she was running a low-grade fever but did not take her temperature. She took a doxycycline that she had leftover and yesterday a single Septra and  metronidazole. She thinks things feel somewhat better. 4/28; duplex ultrasound I ordered last week was negative for DVT or superficial thrombophlebitis. She is completed the doxycycline I gave her. States she is still having a lot of pain in the right calf and right ankle which is no better than last week. She cannot sleep. She also states she has a temperature of up to 101, coughing and complaining of visual loss in her bilateral eyes. Apparently she was tested for Covid 2 weeks ago at [facility] and that was negative. Readmission: 09/03/18 patient presents back for reevaluation after having been evaluated at the end of April regarding erythema and swelling of her right lower extremity. Subsequently she ended up going to the hospital on 07/29/18 and was admitted not to be discharged until 08/08/18. Unfortunately it was noted during the time that she  was in the hospital that she did have methicillin-resistant Staphylococcus aureus as the infection noted at the site. It was also determined that she did have osteomyelitis which appears to be fairly significant. She was treated with vancomycin and in fact is still on IV vancomycin at dialysis currently. This is actually slated to continue until 09/12/18 at least which will be the completion of the six weeks of therapy. Nonetheless based on what I'm seeing at this point I'm not sure she will be anywhere near ready to discontinue antibiotics at that time. Since she was released from the hospital she was seen by [name] who is her podiatrist on 08/27/18. His note specifically states that he is recommended that the patient needs of one knee amputation on the right as she has a life- threatening situation that can lead quickly to sepsis. The patient advised she would like to try to save her leg to which [name] apparently told her that this was against all medical advice. She also want to discontinue the Wound VAC which had been initiated due to the fact that she  wasn't pleased with how the wound was looking and subsequently she wanted to pursue applying Medihoney at that time. He stated that he did not believe that the right lower extremity was salvageable and that the patient understood but would still like to attempt hyperbaric option therapy if it could be of any benefit. She was therefore referred back to [place] for further evaluation. He plans to see her back next week. Upon inspection today patient has a significant amount purulent drainage noted from the wound at this point. The bone in the distal portion of her foot also appears to be extremely necrotic and spongy. When I push down on the bone it bubbles and seeps purulent drainage from deeper in the end of the foot. I do not think that this is likely going to heal very well at all and less aggressive surgical debridement were undertaken more than what I believe we can likely do here in our office. 09/12/2018; I have not seen this patient since the most recent hospitalization although she was in our clinic last week. I have reviewed some of her records from a complex hospitalization. 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 There is situation was complicated by MRSA bacteremia and acute on chronic renal failure now on dialysis. She is receiving vancomycin at dialysis. We started her on Dakin's wet-to-dry last week she is changing this daily. There is still purulent drainage coming out of her foot. Although she is apparently "agreeable" to a below-knee amputation which is been suggested by multiple clinicians she wants this to be done in [place]. She apparently has a telehealth visit with that provider sometime in late [place] 6/24. I have told her I think this is probably too long. Nevertheless I could not convince her to allow a local doctor to perform BKA. 09/19/2018; the patient has a large necrotic area on the right anterior foot. She has had previous  transmetatarsal amputations. Culture I did last week showed MRSA nothing else she is on vancomycin at dialysis. She has continued leaking purulent drainage out of the distal part of the large circular wound on the right anterior foot. She apparently went to see Dr. 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 Somehow that translated into her being referred to plastic surgery for debridement of the area. I gather she basically refused amputation  although I do not have a copy of Dr. Berenice Primas notes. The patient really wants to have a trial of hyperbaric oxygen. I agreed with initial assessment in this clinic that this was probably too far along to benefit however if she is going to have plastic surgery I think she would benefit from ancillary hyperbaric oxygen. The issue here is that the patient has benefited as maximally as any patient I have ever seen from hyperbaric oxygen therapy. Most recently she had exposed bone on the lateral part of her left foot after a surgical procedure and that actually has closed. She has eschared areas in both heels but no open area. She is remained systemically well. I am not optimistic that anything can be done about this but the patient is very clear that she wants an attempt. The attempt would include a wound VAC further debridements and hyperbaric oxygen along with IV antibiotics. 6/26; I put her in for a trial of hyperbaric oxygen only because of the dramatic response she has had with wounds on her left midfoot earlier this year which was a surgical wound that went straight to her bone over the metatarsal heads and also remotely the left third toe. We will see if we can get this through our review process and insurance. She arrives in clinic with again purulent material pouring out of necrotic bone on the top of the foot distally. There is also some concerning erythema on the front of the leg that we marked. It is bit difficult to tell how  tender this is because of neuropathy. I note from infectious disease that she had her vancomycin extended. 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 She had the wound VAC on for part of the week. The rest of the time she is putting various things on this including Medihoney, "ionized water" silver sorb gel etc. 7/7; follow-up along with HBO. She is still on vancomycin at dialysis. She has a large open area on the dorsal right foot and a small dark eschar area on her heel. There is a lot less erythema in the area and a lot less tenderness. From an infection point of view I think this is better. She still has a lot of necrosis in the remaining right forefoot [previous TMA] we are still using the wound VAC in this area 7/16; follow-up along with HBO. I put her on linezolid after she finished her vancomycin. We started this last Friday I gave her 2 weeks worth. I had the expectation that she would be operatively debrided by Dr. Marla Roe but that still has not happened yet. Patient phoned the office this week. She arrives for review today after HBO. The distal part of this wound is completely necrotic. Nonviable pieces of tendon bone was still purulent drainage. Also concerning that she has black eschar over the heel that is expanding. I think this may be indicative of infection in this area as well. She has less erythema and warmth in the ankle and calf but still an abnormal exam 7/21 follow-up along with HBO. I will renew her linezolid after checking a CBC with differential monitoring her blood counts especially her platelets. She was supposed to have surgery yesterday but if I am reading things correctly this was canceled after her blood sugar was found to be over 500. I thought Dr. Marla Roe who called me said that they were sending her to the ER but the patient states that was not the case. 7/28.  Follow-up along with HBO. She is on linezolid I still do not have any  lab work from dialysis even though I called last week. The patient is concerned about an area on her left lateral foot about the level of the base of her fifth metatarsal. I did not really see anything that ominous here however this patient is in [place] ability to point out problems that she is sensing and she has been accurate in the past Finally she received a call from Dr. Marla Roe who is referring her to another orthopedic surgeon stating that she is too booked up to take her to the operating room now. Was still using a wound VAC on the foot 8/3 -Follow-up after HBO, she is got another week of linezolid, she is to call ID for an appointment, x-rays of both feet were reviewed, the left foot x-ray with third MTP joint osteo- Right foot x-ray widespread osteo-in the right midfoot Right ankle x-ray does not show any active evidence of infection 8/11-Patient is seen after HBO, the wounds on the right foot appear to be about the same, the heel wound had some necrotic base over tendon that was debrided with a curette 8/21; patient is seen after HBO. The patient's wound on her dorsal foot actually looks reasonably good and there is substantial amount of epithelialization however the open area distally still has a lot of necrotic debris partially bone. I cannot really get a good sense of just how deep this probes under the foot. She has been pressuring me this week to order medical maggots through a company in [place] for her. The problem I have is there is not a defined wound area here. On the positive side there is no purulence. She has been to see infectious disease she is still on Septra DS although I have not had a chance to review their notes 8/28; patient is seen in conjunction with HBO. The wounds on her foot continued to improve including the right dorsal foot substantially the, the distal part of this wound and the area on the right heel. We have been using a wound VAC over this  chronically. She is still on trimethoprim as directed by infectious disease 9/4; patient is seen in conjunction with HBO. Right dorsal foot wound substantially anteriorly is better however she continues to have a deep wound in the distal part of this that is not responding. We have been using silver collagen under border foam ooArea on the right plantar medial heel seems better. We have been using Hydrofera Blue 12/12/18 on evaluation today patient appears to be doing about the same with regard to her wound based on prior measurements. She does have some necrotic tissue noted on the lateral aspect of the wound that is going require a little bit of sharp debridement today. This includes what appears to be potentially either severely necrotic bone or tendon. Nonetheless other than that she does not appear to have any severe infection which is good news 9/18; it is been 2 weeks since I saw this wound. She is tolerating HBO well. Continued dramatic improvement in the area on the right dorsal foot. She still has a small wound on the heel that we have been using Hydrofera Blue. She continues with a wound VAC 9/24; patient has to be seen emergently today with a swelling on her right lateral lower leg. She says that she told Dr. Evette Doffing about this and also myself on a couple of occasions but I really have no  recollection of this. She is not systemically unwell and her wound really looked good the last time I saw this. She showed this to providers at dialysis and she was able to verify that she was started on cephalexin today for 5 doses at dialysis. She dialyzes on Tuesday Thursday and Saturday. 10/2; patient is seen in conjunction with HBO. The area that is draining on the right anterior medial tibia is more extensive. Copious amounts of serosanguineous drainage with some purulence. We are still using the wound VAC on the original wound then it is stable. Culture I did of the original IandD showed MRSA I  contacted dialysis she is now on vancomycin with dialysis treatments. I asked them to run a month 10/9; patient seen in conjunction with HBO. She had a new spontaneous open area just above the wound on the right medial tibia ankle. More swelling on the right medial tibia. Her wound on the foot looks about the same perhaps slightly better. There is no warmth spreading up her leg but no obvious erythema. her MRI of the foot and ankle and distal tib-fib is not booked for next Friday I discussed this with her in great detail over multiple days. it is likely she has spreading infection upper leg at least involving the distal 25% above the ankle. She knows that if I refer her to orthopedics for infectious disease they are going to recommend amputation and indeed I am not against this myself. We had a good trial at trying to heal the foot which is what she wanted along with antibiotics debridement and HBO however she clearly has spreading infection [probably staph aureus/MRSA]. Nevertheless she once again tells me she wants to wait the left of the MRI. She still makes comments about having her amputation done in [place]. 10/19; arrives today with significant swelling on the lateral right leg. Last culture I did showed Klebsiella. Multidrug-resistant. Cipro was intermediate sensitivity and that is what I have her on pending her MRI which apparently is going to be done on Thursday this week although this seems to be moving back and forth. She is not systemically unwell. We are using silver alginate on her major wound area on the right medial foot and the draining areas on the right lateral lower leg 10/26; MRI showed extensive abscess in the anterior compartment of the right leg also widespread osteomyelitis involving osseous structures of the midfoot and portions of the hindfoot. Also suspicion for osteomyelitis anterior aspect of the distal medial malleolus. Culture I did of the purulence once again  showed a multidrug-resistant Klebsiella. I have been in contact with nephrology late last week and she has been started on cefepime at dialysis to replace the vancomycin We sent a copy of her MRI report to Dr. Geroge Baseman in [place] who is an orthopedic surgeon. The patient takes great stock in his opinion on this. She says she will go to [place] to have her leg amputated if Dr. Geroge Baseman does not feel there is any salvage options. 11/2; she still is not talk to her orthopedic surgeon in [place]. Apparently he will call her at [phone] this afternoon. The quality of this is she has not allowed me to refer her anywhere. She has been told over and over that she needs this amputated but has not agreed to be referred. She tells me her blood sugar was 600 last night but she has not been febrile. 11/9; she never did got a call from the orthopedic surgeon in [place] therefore  that is off the radar. We have arranged to get her see orthopedic surgery at [facility]. She still has a lot of draining purulence coming out of the new abscess in her right leg although that probably came from the osteomyelitis in her right foot and heel. Meanwhile the original wound on the right foot looks very healthy. Continued improvement. The issue is that the last MRI showed osteomyelitis in her right foot extensively she now has an abscess in the right anterior lower leg. There is nobody in [place] who will offer this woman anything but an amputation and to be honest that is probably what she needs. I think she still wants to talk about limb salvage although at this point I just do not see that. She has completed her vancomycin at dialysis which was for the original staph aureus she is still on cefepime for the more recent Klebsiella. She has had a long course of both of these antibiotics which should have benefited the osteomyelitis on the right foot as well as the abscess. 11/16; apparently [place] elective surgery is  shut down because of COVID-19 pandemic. I have reached out to some contacts at [facility] to see if we can get her an orthopedic appointment there. I am concerned about continually leaving this but for the moment everything is static. In fact her original large wound on this foot is closing down. It is the abscess on the right anterior leg that continues to drain purulent serosanguineous material. She is not currently on any antibiotics however she had a prolonged course of vancomycin [1 month] as well as cefepime for a month 02/24/2019 on evaluation today patient appears to be doing better than the last time I saw her. This is not a patient that I typically see. With that being said I am covering for Dr. Dellia Nims this week and again compared to when I last saw her overall the wounds in particular seem to be doing significantly better which is good news. With that being said the patient tells me several disconcerting things. She has not been able to get in to see anyone for potential debridement in regard to her leg wounds although she tells me that she does not think it is necessary any longer because she is taking care of that herself. She noticed a string coming out of the lower wound on her leg over the last week. The patient states that she subsequently decided that we must of pack something in there and started pulling the string out and as it kept coming and coming she realized this was likely her tendon. With that being said she continued to remove as much of this as she could. She then I subsequently proceeded to using tubes of antibiotic ointment which she will stick down into the wound and then scored as much as she can until she sees it coming out of the other wound opening. She states that in doing this she is actually made things better and there is less redness and irritation. With regard to her foot wound she does have some necrotic tendon and tissue noted in one small corner but again the  actual wound itself seems to be doing better with good granulation in general compared to my last evaluation. 12/7; continued improvement in the wound on the substantial part of the right medial foot. Still a necrotic area inferiorly that required debridement but the rest of this looks very healthy and is contracting. She has 2 wounds on the right lateral  leg which were her original drainage sites from her abscess but all of this looks a lot better as well. She has been using silver alginate after putting antibiotic biotic ointment in one wound and watching it come out the other. I have talked to her in some detail today. I had given her names of orthopedic surgeons at [facility] for second opinion on what to do about the right leg. I do not think the patient never called them. She has not been able to get a hold of the orthopedic surgeon in [place] that she had put a lot of faith in as being somebody would give her an opinion that she would trust. I talked to her today and said even if I could get her in to another orthopedic surgeon about the leg which she accept an amputation and she said she would not therefore I am not going to press this issue for the moment 12/14; continued improvement in his substantial wound on the right medial foot. There is still a necrotic area inferiorly with tightly adherent necrotic debris which I have been working on debriding each time she is here. She does not have an orthopedic appointment. Since last time she was here I looked over her cultures which were essentially MRSA on the foot wound and gram-negative rods in the abscess on the anterior leg. 12/21; continued improvement in the area on the right medial foot. She is not up on this much and that is probably a good thing since I do not know it could support continuous ambulation. She has a small area on the right lateral leg which were remanence of the IandD's I did because of the abscess. I think she should  probably have prophylactic antibiotics I am going to have to look this over to see if we can make an intelligent decision here. In the meantime her major wound is come down nicely. Necrotic area inferiorly is still there but looks a lot better 04/06/2019; she has had some improvement in the overall surface area on the right medial foot somewhat narrowedr both but somewhat longer. The areas on the right lateral leg which were initial IandD sites are superficial. Nothing is present on the right heel. We are using silver alginate to the wound areas 1/18; right medial foot somewhat smaller. Still a deep probing area in the most distal recess of the wound. She has nothing open on the right leg. She has a new wound on the plantar aspect of her left fourth toe which may have come from just pulling skin. The patient using Medihoney on the wound on her foot under silver alginate. I cannot discourage her from this 2/1; 2-week follow-up using silver alginate on the right foot and her left fourth toe. The area on the right dorsal foot is contracted although there is still the deep area in the most distal part of the wound but still has some probing depth. No overt infection 2/15; 2-week follow-up. She continues to have improvement in the surface area on the dorsal right foot. Even the tunneling area from last time is almost closed. The area that was on the plantar part of her left fourth toe over the PIP is indeed closed 3/1; 2-week follow-up. Continued improvement in surface area. The original divot that we have been debriding inferiorly I think has full epithelialization although the epithelialization is gone down into the wound with probably 4 mm of depth. Even under intense illumination I am unable to see anything open  here. The remanence of the wound in this area actually look quite healthy. We have been using silver alginate 3/15; 2-week follow-up. Unfortunately not as good today. She has a comma shaped wound  on the dorsal foot however the upper part of this is larger. Under illumination debris on the surface She also tells [place] that she was on her right leg 2 times in the last couple of weeks mostly to reach up for things above her head etc. She felt a sharp pain in the right leg which she thinks is somewhere from the ankle to the knee. The patient has neuropathy and is really uncertain. She cannot feel her foot so she does not think it was coming from there 3/29; 2-week follow-up. Her wound measures smaller. Surface of the wound appears reasonable. She is using silver alginate with underlying Medihoney. She has home health. X-rays I did of her tib-fib last time were negative although it did show arterial calcification 4/12; 2-week follow-up. Her wound measures smaller in length. Using manuka honey with silver alginate on top. She has home health. 4/26; 2-week follow-up. Her wound is smaller but still very adherent debris under illumination requiring debridement she has been using manuka honey with silver alginate. 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 Been using Hydrofera Blue. She is on suppressive Bactrim but apparently she has been taking it twice daily 6/7; I have not seen this wound and about 6 weeks. Since then she was up in [place]. By her own admission she was walking on the foot because she did not have a wheelchair. The wound is not nearly as healthy looking as it was the last time I saw this. We ordered different things for her but she only uses Medihoney and silver alginate. As far as I know she is on suppressive trimethoprim sulfamethoxazole. She does not admit to any fever or chills. Her CBGs apparently are at baseline however she is saying that she feels some discomfort on the lateral part of her ankle I looked over her last inflammatory markers from the summer 2020 at which time  she had a deeply necrotic infected wound in this area. On 11/10/2018 her sedimentation rate was 56 and C-reactive protein 9.9. This was 107 and 29 on 07/29/2018. 6/17; the patient had a necrotic wound the last time she was here on the right dorsal foot. After debridement I did a culture. This showed a very resistant ESBL Klebsiella as well as Enterococcus. Her x-ray of the foot which was done because of warmth and some discomfort showed bone destruction within the carpal bones involving the navicular acute cuboid lateral middle cuneiforms but essentially unchanged from her prior study which was done on 10/29/2018. The findings were felt to represent chronic osteomyelitis. We did inflammatory markers on her. Her white count was 5.25 sedimentation rate 16 and C-reactive protein at 11.1. Notable for the fact that in August 2020 her CRP was 9.9 and sedimentation rate 56. I have looked at her x-rays. It is true that the bone destruction is very impressive however the patient came into this clinic for the wound on her right foot with pieces of bone literally falling out anteriorly with purulent material. I am not exactly sure I could have expected anything different. She has not been systemically unwell no fever chills or blood sugars have been reasonable. 6/28; she arrives with a right  heel closed. The substantial area on the right anterior foot looks healthy. Much better looking surface. I think we can change to [facility] seems to help this previously. She is getting her antibiotics at dialysis she should be just about finished 7/9; changed to [facility] last week. Surface wound looks satisfactory not much change in surface area however. She is going to [place] state next week this is usually a difficult thing for this patient follow-up will be for 2 weeks. 7/23; using Hydrofera Blue. She returns from her trip and the wound looks surprisingly good. Usually when this patient goes on trips she comes  back with a lot of problems with the wound. She is saying that she sometimes feels an episodic "crunching" feeling on the lateral part of the foot. She is neuropathic and not feeling pain but wonders whether this could be a neuropathic dysesthesia. 11/13/19-Patient returns after 3 weeks, the wound itself is stable and patient states that there is nothing new going on she is on some extra anxiety medications and is resisting the temptation to pick at the dry skin around the wound. 9/20; patient has not been here in over a month and I have not seen her in 2 months. The wound in terms of size I think is about the same. There is no exposed bone. She has a nonviable surface on this. She is supposed to be using [facility] however she is also been using some form of honey preparation as well as a silver-based dressings. I do not think she has any pattern to this. 10/4; 2-week follow-up. Patient has been using some form of spray which she says has honey and silver to purchase this online she has been covering it with gauze. In spite of this the wound actually looks quite good. The deeper divot distally appears to be close down. There is a rim of epithelialization. 10/18; 2-week follow-up. Patient has been using her Hydrofera Blue covered with her silver honey spray that she got online. 11/1; 2-week follow-up. She is using Hydrofera Blue with a silver honey spray. Wound bed is measuring smaller. She has noticed that her foot is warmer on the right. She is concerned about infection. For a long period of time I had her on prophylactic trimethoprim sulfamethoxazole DS 1 tablet daily. She is asking for this to be restarted. The patient is walking on this foot because of repairs that are being done in a home her but her room is on the second floor she has to go up and down stairs. I have cautioned against this however as usual she will do exactly what she wants to do 11/15; 2-week follow-up. She uses Hydrofera  Blue with a silver/honey spray which I have never heard of. I think her wound looks about the same. Some epithelialization. No evidence that this is infected. I think she is walking on this more than we agreed on. She is going on extensive vacation over Thanksgiving 12/3; 2-week follow-up. She is using Hydrofera Blue however over Thanksgiving she ran out of this and she is simply been using Medihoney. In spite of this her wound is smaller almost divided into 2 now. She traveled extensively over Thanksgiving and actually looks quite good in spite of this. Usually this is been a marker of problems for her 12/17; 2-week follow-up. She is using Hydrofera Blue. The wound is smaller. Debris on the surface of this is fibrinous. She is traveling to [place] over the holidays which never bodes well  for her wounds. I think she is walking more on her feet then she is even willing to admit and she tells me she does walk 2/11; using Hydrofera Blue. Her wounds are contracting however she walks in the clinic with a history that she has not been able to eat she has had vomiting. She also has right eye problems. Her blood sugar was 567. She had blood work done at dialysis and we called there to get her blood work although it still had not returned although they should have this by the end of the day we were told. She also stated that she was not sure what her blood sugar was as her glucose monitor was not working and not coming to tomorrow. She also for some reason does not think her insulin pump is working well. Her endocrinologist is Dr. Elayne Snare 06/03/2020 upon evaluation today patient actually appears to be doing excellent in regard to her wound. In fact she tells me it was not even bleeding until she picked a dry piece of skin off while we were getting ready to come in and see her today. Fortunately there is no signs of active infection which is great news and overall very pleased. She is going to be seeing her  neurologist sometime shortly I believe it might even be on Monday. Nonetheless there can proceed with the work-up as far as anything else going on with her eye the fact that her right eyelid is drooping. 3/25; right medial dorsal foot. She has superficial areas here that appear to be fully epithelialized she is using Medihoney and silver alginate and some combination. ooShe has a new area on the mid left foot at roughly the level of the fifth metatarsal base 4/84/8; right medial dorsal foot. Still superficial areas here. I cleaned up 1 of these today she has been using I think mostly Medihoney but I would like her to use silver alginate. The area on the mid part of her foot also looks improved on the left. Since she was last here she tells me that she noted pus in the area of her left foot. She told dialysis about this they did a culture and ultimately she is on IV antibiotics but were not sure which one. They referred her to [name] he said that he did not need to follow her. I have not really verified any of this and I do not know what antibiotic she is on at dialysis 4/29; patient has been using Medihoney to her wounds. She reports taking off the toenail to her left second toe. She states that home health discharged her and she has not had help with dressing changes. She denies any signs and symptoms of infection. 5/13; 2-week follow-up. Miraculously the wound on the right foot dorsally is healed. She has an area on the tip of her left third toe and an area in the left midfoot. 5/26; patient presents for 2-week follow-up. She has 2 open wounds 1 to her plantar foot and the other to the left third toe. She has noticed increased swelling and redness to the toe wound. It is unclear exactly what she is doing for dressing changes as she has multiple dressing options at home. She states that it is easiest to do [facility] on the plantar wound and Prisma or Medihoney on the toe wound. 6/3; saw  Dr. Maryjane Hurter week. She put her on Keflex because of the swelling I think of the left second toe. She arrives  today with new breakdown x3 on the dorsal right foot which is disappointing. She also complains of pain in the right ankle, 3 liquid stools per day, chills without fever 6/17; patient was in [place]. I think a little more ambulatory than I would like to hear. In any case she still has the 3 small open areas on the dorsal part of her foot on the right medial leg these look about the same as last time. She is supposed to be using Prisma but I think used Medihoney. She has an area on the left lateral plantar foot which had undermining under her skin and a blood blister with the 2 areas communicating today. And finally an area on the tip of her left second toe 6/30; patient has 3 small areas that look better than the last time on the right dorsal foot. She uses a combination of her products including collagen, Hydrofera Blue and the Medihoney nevertheless they look better. The area on the left midfoot might have been healed although she pulled some skin off superficial open area. The left second toe still does not look that healthy. Open areas on the tips of the 7/21; the areas on her right dorsal foot are still present although they look benign. We are using silver collagen although she may be using any of the products she had at home. The real problem here is the left second toe. Necrotic wound over the distal phalanx and distal interphalangeal joint. This probes directly to bone. She arrives in clinic today with an interesting story. She went to [place] last week. She became ill there with refractory nausea and vomiting. She went to an urgent care and then the ER. I am able to see parts of this in Care Everywhere. She had 2 blood cultures done both of which ultimately showed MRSA. This was sensitive to daptomycin and linezolid and vancomycin. Her white count was 4.8 hemoglobin 10.4. X-ray of the  left foot showed prior resection of the left fifth metatarsal no evidence of osteomyelitis. Her C-reactive protein on 10/13/2020 was 104.8. She refused admission to the hospital as she was to board her flight to return to [place]. She dialyzed this morning I am not certain why she did not bring this to their attention 7/27; fortunately the left second toe and the erythema in her left foot looks a lot better on the IV vancomycin at dialysis. I was able to verify earlier this week that she had indeed received it on the weekend starting last Saturday. She will need at least 4 weeks. I saw her left arm shunt and it looks fine. We are using silver alginate on the toe and we have been using I think Medihoney on the right foot which is actually been her decision. She has 2 open wounds dorsally. She does not describe fever chills her blood sugars are erratic but seem to be at her baseline 8/18; she has completed her antibiotics at dialysis but is unsure how long this was ordered. I have asked her to check when she goes to dialysis on Saturday. She is using polymen on the right foot dorsally and silver collagen not silver alginate to the left second toe. Her blood pressure is high today but blood sugars have been stable according to her she is having no fever or systemic symptoms Objective Constitutional Patient is hypertensive.. Pulse regular and within target range for patient.Marland Kitchen Respirations regular, non-labored and within target range.. Temperature is normal and within the target range  for the patient.Marland Kitchen Appears in no distress. Vitals Time Taken: 3:19 PM, Height: 67 in, Weight: 125 lbs, BMI: 19.6, Temperature: 98.6 F, Pulse: 91 bpm, Respiratory Rate: 16 breaths/min, Blood Pressure: 190/80 mmHg. Cardiovascular Dorsalis pedis pulses are palpable bilaterally. General Notes: Wound exam; left plantar foot remains healed she has an area on her left second toe dorsally replacing the nail. This does not  look infected oo The 2 areas on her right dorsal foot look healthy to me. No evidence of surrounding infection Integumentary (Hair, Skin) Wound #51 status is Open. Original cause of wound was Trauma. The date acquired was: 07/29/2020. The wound has been in treatment 15 weeks. The wound is located on the Left T Second. The wound measures 0.6cm length x 0.9cm width x 0.3cm depth; 0.424cm^2 area and 0.127cm^3 volume. There is Fat Layer oe (Subcutaneous Tissue) exposed. There is no tunneling or undermining noted. There is a medium amount of serosanguineous drainage noted. The wound margin is distinct with the outline attached to the wound base. There is large (67-100%) red granulation within the wound bed. There is no necrotic tissue within the wound bed. Wound #52 status is Open. Original cause of wound was Gradually Appeared. The date acquired was: 09/02/2020. The wound has been in treatment 10 weeks. The wound is located on the Right Foot. The wound measures 0.7cm length x 3.4cm width x 0.1cm depth; 1.869cm^2 area and 0.187cm^3 volume. There is Fat Layer (Subcutaneous Tissue) exposed. There is no tunneling or undermining noted. There is a medium amount of serosanguineous drainage noted. The wound margin is distinct with the outline attached to the wound base. There is medium (34-66%) pink granulation within the wound bed. There is a medium (34-66%) amount of necrotic tissue within the wound bed. 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 - with Dr. Dellia Nims Bathing/ Shower/ Hygiene: May shower and wash wound with soap and water. Edema Control - Lymphedema / SCD / Other: Elevate legs to the level of the  heart or above for 30 minutes daily and/or when sitting, a frequency of: - throughout the day Avoid standing for long periods of time. Moisturize legs daily. - with dressing changes Off-Loading: Open toe surgical [name] to: - to both feet Other: - minimal weight bearing right foot WOUND #51: - T Second Wound Laterality: Left oe Cleanser: Soap and Water Every Other Day/30 Days Discharge Instructions: May shower and wash wound with dial antibacterial soap and water prior to dressing change. Prim Dressing: Promogran Prisma Matrix, 4.34 (sq in) (silver collagen) Every Other Day/30 Days ary Discharge Instructions: Moisten collagen with saline or hydrogel Secondary Dressing: Woven Gauze Sponge, Non-Sterile 4x4 in (Generic) Every Other Day/30 Days Discharge Instructions: Apply over primary dressing as directed. Secured With: Child psychotherapist, Sterile 2x75 (in/in) Every Other Day/30 Days Discharge Instructions: Secure with stretch gauze as directed. Secured With: Paper T ape, 1x10 (in/yd) Every Other Day/30 Days Discharge Instructions: Secure dressing with tape as directed. WOUND #52: - Foot Wound Laterality: Right Cleanser: Soap and Water Every Other Day/30 Days Discharge Instructions: May shower and wash wound with dial antibacterial soap and water prior to dressing change. Prim Dressing: PolyMem Non-Adhesive Dressing, 4x4 in (Generic) Every Other Day/30 Days ary Discharge Instructions: Apply  to wound bed as instructed Secondary Dressing: Woven Gauze Sponge, Non-Sterile 4x4 in (Generic) Every Other Day/30 Days Discharge Instructions: Apply over primary dressing as directed. Secured With: Elastic Bandage 4 inch (ACE bandage) Every Other Day/30 Days Discharge Instructions: Secure with ACE bandage as directed. Secured With: Child psychotherapist, Sterile 2x75 (in/in) Every Other Day/30 Days Discharge Instructions: Secure with stretch gauze as directed. Secured With:  Paper T ape, 1x10 (in/yd) Every Other Day/30 Days Discharge Instructions: Secure dressing with tape as directed. 1. She wants to use silver collagen to the left second toe I am okay with that 2. Polymen Ag to the right dorsal foot 3. Have asked her to verify how long she ran the vancomycin at dialysis Electronic Signature(s) Signed: 11/17/2020 4:52:40 PM By: Linton Ham MD Entered By: Linton Ham on 11/17/2020 16:02:35 -------------------------------------------------------------------------------- SuperBill Details Patient Name: Date of Service: Nancy LLO [name] 11/17/2020 Medical Record Number: [id number] Patient Account Number: [id number] Date of Birth/Sex: Treating RN: 08-May-1970 (50 y.o. 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 M86.172 Other acute osteomyelitis, left ankle and foot Facility Procedures CPT4 Code: 88280034 Description: 99214 - WOUND CARE VISIT-LEV 4 EST PT Modifier: Quantity: 1 Physician Procedures : CPT4 Code Description Modifier [id number] 99213 - WC PHYS LEVEL 3 - EST PT ICD-10 Diagnosis Description L97.514 Non-pressure chronic ulcer of other part of right foot with necrosis of bone L97.529 Non-pressure chronic ulcer of other part of left foot with  unspecified severity E10.621 Type 1 diabetes mellitus with foot ulcer Quantity: 1 Electronic Signature(s) Signed: 11/17/2020 4:52:40 PM By: Linton Ham MD Signed: 11/17/2020 6:20:17 PM By: [name] RN, BSN Entered By: [name] on 11/17/2020 16:05:11

## 2020-11-21 ENCOUNTER — Encounter (HOSPITAL_COMMUNITY): Payer: Self-pay

## 2020-11-21 ENCOUNTER — Emergency Department (HOSPITAL_COMMUNITY): Payer: Medicare HMO

## 2020-11-21 ENCOUNTER — Other Ambulatory Visit: Payer: Self-pay

## 2020-11-21 ENCOUNTER — Other Ambulatory Visit: Payer: Self-pay | Admitting: Endocrinology

## 2020-11-21 ENCOUNTER — Inpatient Hospital Stay (HOSPITAL_COMMUNITY)
Admission: EM | Admit: 2020-11-21 | Discharge: 2020-12-01 | DRG: 871 | Disposition: A | Discharge status: Home Health | Payer: Medicare HMO | Attending: Internal Medicine | Admitting: Internal Medicine

## 2020-11-21 DIAGNOSIS — E871 Hypo-osmolality and hyponatremia: Secondary | ICD-10-CM | POA: Diagnosis present

## 2020-11-21 DIAGNOSIS — R4182 Altered mental status, unspecified: Secondary | ICD-10-CM

## 2020-11-21 DIAGNOSIS — E1069 Type 1 diabetes mellitus with other specified complication: Secondary | ICD-10-CM | POA: Diagnosis present

## 2020-11-21 DIAGNOSIS — H543 Unqualified visual loss, both eyes: Secondary | ICD-10-CM | POA: Diagnosis present

## 2020-11-21 DIAGNOSIS — Z794 Long term (current) use of insulin: Secondary | ICD-10-CM

## 2020-11-21 DIAGNOSIS — R7881 Bacteremia: Secondary | ICD-10-CM | POA: Diagnosis present

## 2020-11-21 DIAGNOSIS — Z83438 Family history of other disorder of lipoprotein metabolism and other lipidemia: Secondary | ICD-10-CM

## 2020-11-21 DIAGNOSIS — Z87891 Personal history of nicotine dependence: Secondary | ICD-10-CM

## 2020-11-21 DIAGNOSIS — E1022 Type 1 diabetes mellitus with diabetic chronic kidney disease: Secondary | ICD-10-CM | POA: Diagnosis present

## 2020-11-21 DIAGNOSIS — G9341 Metabolic encephalopathy: Secondary | ICD-10-CM | POA: Diagnosis present

## 2020-11-21 DIAGNOSIS — I160 Hypertensive urgency: Secondary | ICD-10-CM | POA: Diagnosis present

## 2020-11-21 DIAGNOSIS — E1051 Type 1 diabetes mellitus with diabetic peripheral angiopathy without gangrene: Secondary | ICD-10-CM

## 2020-11-21 DIAGNOSIS — R6520 Severe sepsis without septic shock: Secondary | ICD-10-CM | POA: Diagnosis not present

## 2020-11-21 DIAGNOSIS — Z781 Physical restraint status: Secondary | ICD-10-CM

## 2020-11-21 DIAGNOSIS — E10621 Type 1 diabetes mellitus with foot ulcer: Secondary | ICD-10-CM | POA: Diagnosis present

## 2020-11-21 DIAGNOSIS — E1010 Type 1 diabetes mellitus with ketoacidosis without coma: Secondary | ICD-10-CM | POA: Diagnosis present

## 2020-11-21 DIAGNOSIS — E10319 Type 1 diabetes mellitus with unspecified diabetic retinopathy without macular edema: Secondary | ICD-10-CM | POA: Diagnosis present

## 2020-11-21 DIAGNOSIS — E1011 Type 1 diabetes mellitus with ketoacidosis with coma: Secondary | ICD-10-CM | POA: Diagnosis present

## 2020-11-21 DIAGNOSIS — G40901 Epilepsy, unspecified, not intractable, with status epilepticus: Secondary | ICD-10-CM | POA: Diagnosis present

## 2020-11-21 DIAGNOSIS — T148XXA Other injury of unspecified body region, initial encounter: Secondary | ICD-10-CM

## 2020-11-21 DIAGNOSIS — Z992 Dependence on renal dialysis: Secondary | ICD-10-CM | POA: Diagnosis not present

## 2020-11-21 DIAGNOSIS — H53133 Sudden visual loss, bilateral: Secondary | ICD-10-CM | POA: Diagnosis not present

## 2020-11-21 DIAGNOSIS — Z89431 Acquired absence of right foot: Secondary | ICD-10-CM | POA: Diagnosis not present

## 2020-11-21 DIAGNOSIS — L03116 Cellulitis of left lower limb: Secondary | ICD-10-CM | POA: Diagnosis present

## 2020-11-21 DIAGNOSIS — Z9641 Presence of insulin pump (external) (internal): Secondary | ICD-10-CM | POA: Diagnosis present

## 2020-11-21 DIAGNOSIS — Z79899 Other long term (current) drug therapy: Secondary | ICD-10-CM

## 2020-11-21 DIAGNOSIS — M869 Osteomyelitis, unspecified: Secondary | ICD-10-CM | POA: Diagnosis not present

## 2020-11-21 DIAGNOSIS — Z882 Allergy status to sulfonamides status: Secondary | ICD-10-CM

## 2020-11-21 DIAGNOSIS — M86672 Other chronic osteomyelitis, left ankle and foot: Secondary | ICD-10-CM | POA: Diagnosis present

## 2020-11-21 DIAGNOSIS — D631 Anemia in chronic kidney disease: Secondary | ICD-10-CM | POA: Diagnosis present

## 2020-11-21 DIAGNOSIS — E8770 Fluid overload, unspecified: Secondary | ICD-10-CM | POA: Diagnosis present

## 2020-11-21 DIAGNOSIS — F419 Anxiety disorder, unspecified: Secondary | ICD-10-CM | POA: Diagnosis present

## 2020-11-21 DIAGNOSIS — Z20822 Contact with and (suspected) exposure to covid-19: Secondary | ICD-10-CM | POA: Diagnosis present

## 2020-11-21 DIAGNOSIS — B9562 Methicillin resistant Staphylococcus aureus infection as the cause of diseases classified elsewhere: Secondary | ICD-10-CM | POA: Diagnosis not present

## 2020-11-21 DIAGNOSIS — H539 Unspecified visual disturbance: Secondary | ICD-10-CM | POA: Diagnosis not present

## 2020-11-21 DIAGNOSIS — A4102 Sepsis due to Methicillin resistant Staphylococcus aureus: Principal | ICD-10-CM | POA: Diagnosis present

## 2020-11-21 DIAGNOSIS — Z7982 Long term (current) use of aspirin: Secondary | ICD-10-CM

## 2020-11-21 DIAGNOSIS — E1040 Type 1 diabetes mellitus with diabetic neuropathy, unspecified: Secondary | ICD-10-CM | POA: Diagnosis present

## 2020-11-21 DIAGNOSIS — A419 Sepsis, unspecified organism: Secondary | ICD-10-CM

## 2020-11-21 DIAGNOSIS — E7800 Pure hypercholesterolemia, unspecified: Secondary | ICD-10-CM | POA: Diagnosis present

## 2020-11-21 DIAGNOSIS — M898X9 Other specified disorders of bone, unspecified site: Secondary | ICD-10-CM | POA: Diagnosis present

## 2020-11-21 DIAGNOSIS — E1110 Type 2 diabetes mellitus with ketoacidosis without coma: Secondary | ICD-10-CM | POA: Diagnosis present

## 2020-11-21 DIAGNOSIS — H547 Unspecified visual loss: Secondary | ICD-10-CM | POA: Diagnosis not present

## 2020-11-21 DIAGNOSIS — I10 Essential (primary) hypertension: Secondary | ICD-10-CM | POA: Diagnosis present

## 2020-11-21 DIAGNOSIS — I120 Hypertensive chronic kidney disease with stage 5 chronic kidney disease or end stage renal disease: Secondary | ICD-10-CM | POA: Diagnosis present

## 2020-11-21 DIAGNOSIS — R197 Diarrhea, unspecified: Secondary | ICD-10-CM | POA: Diagnosis present

## 2020-11-21 DIAGNOSIS — Z883 Allergy status to other anti-infective agents status: Secondary | ICD-10-CM

## 2020-11-21 DIAGNOSIS — N179 Acute kidney failure, unspecified: Secondary | ICD-10-CM | POA: Diagnosis present

## 2020-11-21 DIAGNOSIS — E875 Hyperkalemia: Secondary | ICD-10-CM | POA: Diagnosis present

## 2020-11-21 DIAGNOSIS — N186 End stage renal disease: Secondary | ICD-10-CM

## 2020-11-21 DIAGNOSIS — G4730 Sleep apnea, unspecified: Secondary | ICD-10-CM | POA: Diagnosis present

## 2020-11-21 DIAGNOSIS — Z880 Allergy status to penicillin: Secondary | ICD-10-CM

## 2020-11-21 DIAGNOSIS — L97529 Non-pressure chronic ulcer of other part of left foot with unspecified severity: Secondary | ICD-10-CM | POA: Diagnosis present

## 2020-11-21 DIAGNOSIS — Z86718 Personal history of other venous thrombosis and embolism: Secondary | ICD-10-CM

## 2020-11-21 DIAGNOSIS — Z888 Allergy status to other drugs, medicaments and biological substances status: Secondary | ICD-10-CM

## 2020-11-21 LAB — CBG MONITORING, ED
Glucose-Capillary: >600 mg/dL (ref 70–99)
Glucose-Capillary: >600 mg/dL (ref 70–99)
Glucose-Capillary: 556 mg/dL (ref 70–99)
Glucose-Capillary: 475 mg/dL — ABNORMAL HIGH (ref 70–99)
Glucose-Capillary: 517 mg/dL (ref 70–99)
Glucose-Capillary: 370 mg/dL — ABNORMAL HIGH (ref 70–99)
Glucose-Capillary: 384 mg/dL — ABNORMAL HIGH (ref 70–99)
Glucose-Capillary: 252 mg/dL — ABNORMAL HIGH (ref 70–99)
Glucose-Capillary: 226 mg/dL — ABNORMAL HIGH (ref 70–99)
Glucose-Capillary: 167 mg/dL — ABNORMAL HIGH (ref 70–99)

## 2020-11-21 LAB — BASIC METABOLIC PANEL
Anion gap: 14 (ref 5–15)
Anion gap: 15 (ref 5–15)
Anion gap: 19 — ABNORMAL HIGH (ref 5–15)
BUN: 58 mg/dL — ABNORMAL HIGH (ref 6–20)
BUN: 58 mg/dL — ABNORMAL HIGH (ref 6–20)
BUN: 60 mg/dL — ABNORMAL HIGH (ref 6–20)
CO2: 22 mmol/L (ref 22–32)
CO2: 22 mmol/L (ref 22–32)
CO2: 18 mmol/L — ABNORMAL LOW (ref 22–32)
Calcium: 8.3 mg/dL — ABNORMAL LOW (ref 8.9–10.3)
Calcium: 8.5 mg/dL — ABNORMAL LOW (ref 8.9–10.3)
Calcium: 8.4 mg/dL — ABNORMAL LOW (ref 8.9–10.3)
Chloride: 91 mmol/L — ABNORMAL LOW (ref 98–111)
Chloride: 92 mmol/L — ABNORMAL LOW (ref 98–111)
Chloride: 90 mmol/L — ABNORMAL LOW (ref 98–111)
Creatinine, Ser: 6.91 mg/dL — ABNORMAL HIGH (ref 0.44–1.00)
Creatinine, Ser: 7.56 mg/dL — ABNORMAL HIGH (ref 0.44–1.00)
Creatinine, Ser: 7.74 mg/dL — ABNORMAL HIGH (ref 0.44–1.00)
GFR, Estimated: 7 mL/min — ABNORMAL LOW (ref >60)
GFR, Estimated: 6 mL/min — ABNORMAL LOW (ref >60)
GFR, Estimated: 6 mL/min — ABNORMAL LOW (ref >60)
Glucose, Bld: 191 mg/dL — ABNORMAL HIGH (ref 70–99)
Glucose, Bld: 67 mg/dL — ABNORMAL LOW (ref 70–99)
Glucose, Bld: 240 mg/dL — ABNORMAL HIGH (ref 70–99)
Potassium: 5.5 mmol/L — ABNORMAL HIGH (ref 3.5–5.1)
Potassium: 5.1 mmol/L (ref 3.5–5.1)
Potassium: 5.7 mmol/L — ABNORMAL HIGH (ref 3.5–5.1)
Sodium: 127 mmol/L — ABNORMAL LOW (ref 135–145)
Sodium: 129 mmol/L — ABNORMAL LOW (ref 135–145)
Sodium: 127 mmol/L — ABNORMAL LOW (ref 135–145)

## 2020-11-21 LAB — BLOOD GAS, VENOUS
Acid-Base Excess: 1.2 mmol/L (ref 0.0–2.0)
Acid-base deficit: 7 mmol/L — ABNORMAL HIGH (ref 0.0–2.0)
Bicarbonate: 18.3 mmol/L — ABNORMAL LOW (ref 20.0–28.0)
Bicarbonate: 25.2 mmol/L (ref 20.0–28.0)
FIO2: 21
O2 Saturation: 93 %
O2 Saturation: 79 %
Patient temperature: 98.6
Patient temperature: 37
pCO2, Ven: 38 mmHg — ABNORMAL LOW (ref 44.0–60.0)
pCO2, Ven: 39.4 mmHg — ABNORMAL LOW (ref 44.0–60.0)
pH, Ven: 7.304 (ref 7.250–7.430)
pH, Ven: 7.422 (ref 7.250–7.430)
pO2, Ven: 76.8 mmHg — ABNORMAL HIGH (ref 32.0–45.0)
pO2, Ven: 43.3 mmHg (ref 32.0–45.0)

## 2020-11-21 LAB — CBC WITH DIFFERENTIAL/PLATELET
Abs Immature Granulocytes: 0.04 10*3/uL (ref 0.00–0.07)
Abs Immature Granulocytes: 0.02 10*3/uL (ref 0.00–0.07)
Basophils Absolute: 0.1 10*3/uL (ref 0.0–0.1)
Basophils Absolute: 0 10*3/uL (ref 0.0–0.1)
Basophils Relative: 1 %
Basophils Relative: 0 %
Eosinophils Absolute: 0.1 10*3/uL (ref 0.0–0.5)
Eosinophils Absolute: 0 10*3/uL (ref 0.0–0.5)
Eosinophils Relative: 1 %
Eosinophils Relative: 1 %
HCT: 32.8 % — ABNORMAL LOW (ref 36.0–46.0)
HCT: 30.6 % — ABNORMAL LOW (ref 36.0–46.0)
Hemoglobin: 11 g/dL — ABNORMAL LOW (ref 12.0–15.0)
Hemoglobin: 10.2 g/dL — ABNORMAL LOW (ref 12.0–15.0)
Immature Granulocytes: 0 %
Immature Granulocytes: 0 %
Lymphocytes Relative: 9 %
Lymphocytes Relative: 13 %
Lymphs Abs: 0.9 10*3/uL (ref 0.7–4.0)
Lymphs Abs: 1 10*3/uL (ref 0.7–4.0)
MCH: 30 pg (ref 26.0–34.0)
MCH: 29.5 pg (ref 26.0–34.0)
MCHC: 33.5 g/dL (ref 30.0–36.0)
MCHC: 33.3 g/dL (ref 30.0–36.0)
MCV: 89.4 fL (ref 80.0–100.0)
MCV: 88.4 fL (ref 80.0–100.0)
Monocytes Absolute: 0.6 10*3/uL (ref 0.1–1.0)
Monocytes Absolute: 0.7 10*3/uL (ref 0.1–1.0)
Monocytes Relative: 6 %
Monocytes Relative: 9 %
Neutro Abs: 8.6 10*3/uL — ABNORMAL HIGH (ref 1.7–7.7)
Neutro Abs: 6 10*3/uL (ref 1.7–7.7)
Neutrophils Relative %: 83 %
Neutrophils Relative %: 77 %
Platelets: 246 10*3/uL (ref 150–400)
Platelets: 226 10*3/uL (ref 150–400)
RBC: 3.67 MIL/uL — ABNORMAL LOW (ref 3.87–5.11)
RBC: 3.46 MIL/uL — ABNORMAL LOW (ref 3.87–5.11)
RDW: 13.7 % (ref 11.5–15.5)
RDW: 13.7 % (ref 11.5–15.5)
WBC: 10.3 10*3/uL (ref 4.0–10.5)
WBC: 7.8 10*3/uL (ref 4.0–10.5)
nRBC: 0 % (ref 0.0–0.2)
nRBC: 0 % (ref 0.0–0.2)

## 2020-11-21 LAB — COMPREHENSIVE METABOLIC PANEL
ALT: 45 U/L — ABNORMAL HIGH (ref 0–44)
AST: 28 U/L (ref 15–41)
Albumin: 3.9 g/dL (ref 3.5–5.0)
Alkaline Phosphatase: 242 U/L — ABNORMAL HIGH (ref 38–126)
Anion gap: 22 — ABNORMAL HIGH (ref 5–15)
BUN: 57 mg/dL — ABNORMAL HIGH (ref 6–20)
CO2: 18 mmol/L — ABNORMAL LOW (ref 22–32)
Calcium: 8.6 mg/dL — ABNORMAL LOW (ref 8.9–10.3)
Chloride: 84 mmol/L — ABNORMAL LOW (ref 98–111)
Creatinine, Ser: 7.19 mg/dL — ABNORMAL HIGH (ref 0.44–1.00)
GFR, Estimated: 6 mL/min — ABNORMAL LOW (ref >60)
Glucose, Bld: 775 mg/dL (ref 70–99)
Potassium: 4.3 mmol/L (ref 3.5–5.1)
Sodium: 124 mmol/L — ABNORMAL LOW (ref 135–145)
Total Bilirubin: 0.7 mg/dL (ref 0.3–1.2)
Total Protein: 8.1 g/dL (ref 6.5–8.1)

## 2020-11-21 LAB — HIV ANTIBODY (ROUTINE TESTING W REFLEX): HIV Screen 4th Generation wRfx: NONREACTIVE

## 2020-11-21 LAB — I-STAT BETA HCG BLOOD, ED (MC, WL, AP ONLY): I-stat hCG, quantitative: <5 m[IU]/mL (ref <5)

## 2020-11-21 LAB — GLUCOSE, CAPILLARY
Glucose-Capillary: 105 mg/dL — ABNORMAL HIGH (ref 70–99)
Glucose-Capillary: 189 mg/dL — ABNORMAL HIGH (ref 70–99)
Glucose-Capillary: 69 mg/dL — ABNORMAL LOW (ref 70–99)
Glucose-Capillary: 229 mg/dL — ABNORMAL HIGH (ref 70–99)
Glucose-Capillary: 229 mg/dL — ABNORMAL HIGH (ref 70–99)

## 2020-11-21 LAB — RESP PANEL BY RT-PCR (FLU A&B, COVID) ARPGX2
Influenza A by PCR: NEGATIVE
Influenza B by PCR: NEGATIVE
SARS Coronavirus 2 by RT PCR: NEGATIVE

## 2020-11-21 LAB — PROTIME-INR
INR: 1 (ref 0.8–1.2)
Prothrombin Time: 13 seconds (ref 11.4–15.2)

## 2020-11-21 LAB — LACTIC ACID, PLASMA
Lactic Acid, Venous: 3.8 mmol/L (ref 0.5–1.9)
Lactic Acid, Venous: 1.6 mmol/L (ref 0.5–1.9)

## 2020-11-21 LAB — TROPONIN I (HIGH SENSITIVITY)
Troponin I (High Sensitivity): 14 ng/L (ref <18)
Troponin I (High Sensitivity): 16 ng/L (ref <18)

## 2020-11-21 LAB — LIPASE, BLOOD: Lipase: 40 U/L (ref 11–51)

## 2020-11-21 LAB — OSMOLALITY: Osmolality: 291 mOsm/kg (ref 275–295)

## 2020-11-21 LAB — APTT: aPTT: 33 seconds (ref 24–36)

## 2020-11-21 LAB — BETA-HYDROXYBUTYRIC ACID
Beta-Hydroxybutyric Acid: 1.07 mmol/L — ABNORMAL HIGH (ref 0.05–0.27)
Beta-Hydroxybutyric Acid: 0.08 mmol/L (ref 0.05–0.27)
Beta-Hydroxybutyric Acid: 0.15 mmol/L (ref 0.05–0.27)
Beta-Hydroxybutyric Acid: 2.74 mmol/L — ABNORMAL HIGH (ref 0.05–0.27)

## 2020-11-21 MED ORDER — DEXTROSE IN LACTATED RINGERS 5 % IV SOLN: INTRAVENOUS | Status: DC

## 2020-11-21 MED ORDER — LACTATED RINGERS IV SOLN: INTRAVENOUS | Status: DC

## 2020-11-21 MED ORDER — LACTATED RINGERS IV BOLUS (SEPSIS)
250.0000 mL | Freq: Once | INTRAVENOUS | Status: AC
  Administered 2020-11-21: 250 mL via INTRAVENOUS

## 2020-11-21 MED ORDER — LORAZEPAM 2 MG/ML IJ SOLN
INTRAMUSCULAR | Status: AC
  Filled 2020-11-21: qty 1

## 2020-11-21 MED ORDER — SODIUM CHLORIDE 0.9 % IV SOLN: 2.0000 g | INTRAVENOUS | Status: DC

## 2020-11-21 MED ORDER — LORAZEPAM 2 MG/ML IJ SOLN
1.0000 mg | Freq: Once | INTRAMUSCULAR | Status: AC
  Administered 2020-11-21: 1 mg via INTRAVENOUS
  Filled 2020-11-21: qty 1

## 2020-11-21 MED ORDER — ACETAMINOPHEN 650 MG RE SUPP
650.0000 mg | Freq: Once | RECTAL | Status: AC
  Administered 2020-11-21: 650 mg via RECTAL
  Filled 2020-11-21: qty 1

## 2020-11-21 MED ORDER — VANCOMYCIN HCL IN DEXTROSE 1-5 GM/200ML-% IV SOLN
1000.0000 mg | Freq: Once | INTRAVENOUS | Status: AC
  Administered 2020-11-21: 1000 mg via INTRAVENOUS
  Filled 2020-11-21: qty 200

## 2020-11-21 MED ORDER — LACTATED RINGERS IV BOLUS: 20.0000 mL/kg | Freq: Once | INTRAVENOUS | Status: DC

## 2020-11-21 MED ORDER — POTASSIUM CHLORIDE 10 MEQ/100ML IV SOLN: 10.0000 meq | INTRAVENOUS | Status: DC

## 2020-11-21 MED ORDER — INSULIN REGULAR(HUMAN) IN NACL 100-0.9 UT/100ML-% IV SOLN
INTRAVENOUS | Status: DC
  Administered 2020-11-21: 7.5 [IU]/h via INTRAVENOUS
  Filled 2020-11-21: qty 100

## 2020-11-21 MED ORDER — DEXTROSE 50 % IV SOLN
0.0000 mL | INTRAVENOUS | Status: DC | PRN
  Administered 2020-11-21: 30 mL via INTRAVENOUS
  Filled 2020-11-21: qty 50

## 2020-11-21 MED ORDER — POTASSIUM CHLORIDE 10 MEQ/100ML IV SOLN
10.0000 meq | INTRAVENOUS | Status: AC
  Administered 2020-11-21 (×2): 10 meq via INTRAVENOUS
  Filled 2020-11-21 (×2): qty 100

## 2020-11-21 MED ORDER — ONDANSETRON HCL 4 MG/2ML IJ SOLN
4.0000 mg | Freq: Four times a day (QID) | INTRAMUSCULAR | Status: DC | PRN
  Administered 2020-11-21: 4 mg via INTRAVENOUS
  Filled 2020-11-21: qty 2

## 2020-11-21 MED ORDER — METRONIDAZOLE 500 MG/100ML IV SOLN
500.0000 mg | Freq: Once | INTRAVENOUS | Status: AC
  Administered 2020-11-21: 500 mg via INTRAVENOUS
  Filled 2020-11-21: qty 100

## 2020-11-21 MED ORDER — INSULIN ASPART 100 UNIT/ML IJ SOLN
0.0000 [IU] | Freq: Three times a day (TID) | INTRAMUSCULAR | Status: DC
  Administered 2020-11-21: 2 [IU] via SUBCUTANEOUS

## 2020-11-21 MED ORDER — LACTATED RINGERS IV BOLUS
20.0000 mL/kg | Freq: Once | INTRAVENOUS | Status: DC
  Administered 2020-11-21: 1216 mL via INTRAVENOUS

## 2020-11-21 MED ORDER — INSULIN REGULAR(HUMAN) IN NACL 100-0.9 UT/100ML-% IV SOLN: INTRAVENOUS | Status: DC

## 2020-11-21 MED ORDER — COLLAGENASE 250 UNIT/GM EX OINT
TOPICAL_OINTMENT | Freq: Every day | CUTANEOUS | Status: DC
  Administered 2020-11-26: 1 via TOPICAL
  Filled 2020-11-21 (×2): qty 30

## 2020-11-21 MED ORDER — CHLORHEXIDINE GLUCONATE CLOTH 2 % EX PADS
6.0000 | MEDICATED_PAD | Freq: Every day | CUTANEOUS | Status: DC
  Administered 2020-11-22 – 2020-12-01 (×8): 6 via TOPICAL

## 2020-11-21 MED ORDER — SODIUM CHLORIDE 0.9 % IV SOLN
2.0000 g | Freq: Once | INTRAVENOUS | Status: AC
  Administered 2020-11-21: 2 g via INTRAVENOUS
  Filled 2020-11-21: qty 2

## 2020-11-21 MED ORDER — DEXTROSE 50 % IV SOLN: 0.0000 mL | INTRAVENOUS | Status: DC | PRN

## 2020-11-21 NOTE — ED Triage Notes (Signed)
Per EMS, pt coming from home for high BS. Pt reported high BS for 2-3 days with vision changes. Pt being difficult with EMS. Agitated. Dry heaving on arrival to ED. Per EMS, they were initially called out to help pt with her insulin pump. Pt did not want to come to the ED. Pt's roommate administered 7 units of Novolog to pt 1 hr prior to EMS arrival. Pt is a dialysis pt and EMS unsure of pt's dialysis sched.

## 2020-11-21 NOTE — Sepsis Progress Note (Signed)
Sepsis protocol is being monitored by [facility]. 

## 2020-11-21 NOTE — ED Notes (Signed)
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  CT agreeable to attempt.  Will transport to CT in 10-15 minutes per CT.

## 2020-11-21 NOTE — Progress Notes (Signed)
   11/21/20 1428  Assess: MEWS Score  Temp 98.6 F (37 C)  BP (!) 156/89  ECG Heart Rate 83  Resp 17  Level of Consciousness Responds to Pain  SpO2 97 %  O2 Device Room Air  Assess: MEWS Score  MEWS Temp 0  MEWS Systolic 0  MEWS Pulse 0  MEWS RR 0  MEWS LOC 2  MEWS Score 2  MEWS Score Color Yellow  Assess: if the MEWS score is Yellow or Red  Were vital signs taken at a resting state? Yes  Focused Assessment No change from prior assessment  Early Detection of Sepsis Score *See Row Information* Low  MEWS guidelines implemented *See Row Information* Yes  Treat  MEWS Interventions Other (Comment)  Pain Scale 0-10  Pain Score Asleep  Take Vital Signs  Increase Vital Sign Frequency  Yellow: Q 2hr X 2 then Q 4hr X 2, if remains yellow, continue Q 4hrs  Escalate  MEWS: Escalate Yellow: discuss with charge nurse/RN and consider discussing with provider and RRT  Notify: Charge Nurse/RN  Name of Charge Nurse/RN Notified Judson Roch  Date Charge Nurse/RN Notified 11/21/20  Time Charge Nurse/RN Notified 6701  Notify: Provider  Provider Name/Title Dr. Lonny Prude  Date Provider Notified 11/21/20  Time Provider Notified 1444  Notification Type Page  Notification Reason Change in status  Provider response No new orders  Date of Provider Response 11/21/20  Time of Provider Response 1448  Document  Patient Outcome Other (Comment)  Progress note created (see row info) Yes

## 2020-11-21 NOTE — ED Provider Notes (Signed)
Physical Exam  BP (!) 160/138   Pulse 92   Temp (!) 101 F (38.3 C) (Rectal)   Resp 15   Ht $R'5\' 7"'IR$  (1.702 m)   Wt 60.8 kg   SpO2 100%   BMI 20.99 kg/m   Physical Exam  ED Course/Procedures   Clinical Course as of 11/21/20 1202  Mon Nov 21, 2020  1013 Consult to hospitalist, Dr. Dwyane Dee, was agreeable seeing this patient admitting her to inpatient service at [facility].  She will need require transfer to [facility] given she is dialysis dependent. CT remains pending at this time.   [RS]    Clinical Course User Index [RS] Carianne Taira R, PA-C    .Critical Care  Date/Time: 11/21/2020 12:07 PM Performed by: Emeline Darling, PA-C Authorized by: Emeline Darling, PA-C   Critical care provider statement:    Critical care time (minutes):  45   Critical care was time spent personally by me on the following activities:  Discussions with consultants, evaluation of patient's response to treatment, examination of patient, ordering and performing treatments and interventions, ordering and review of laboratory studies, ordering and review of radiographic studies, pulse oximetry, re-evaluation of patient's condition, obtaining history from patient or surrogate and review of old [place] of this patient assumed from preceding ED provider Abigail Butts, PA-C at time of shift change.  Please see her associated note for further insight in the patient's ED course.  In brief patient is a 50 year old insulin-dependent diabetic who also has end-stage renal disease dependent upon dialysis who presents for altered mental status via EMS.  Unknown last dialysis session, and dysfunction of insulin pump reported by EMS.  Patient was combative upon time of her arrival.  Found to be in DKA with glucose of 775, elevated beta hydroxybutyric acid and pH of 7.3.  CBC was without leukocytosis with mild anemia with hemoglobin of 11.  CMP with hyponatremia and transaminitis with  ALT of 45 and elevated alk phos of 242.  Elevated anion gap to 22.  Additionally, x-ray showed evidence of possible osteomyelitis of the left second toe.  Troponin pending, ordered given Nonspecific T changes in the inferior leads on her EKG.  Additionally patient awaiting CT scan at time of shift change.  At the time of my arrival to the room, patient remains altered, somewhat combative with nursing staff who are placing her on Endo tool for her hyperglycemia.  Will ask CT to proceed with scan in the side-lying position, as this is obviously patient's preference and I would like to avoid any sedating medications at this time.  Patient will certainly require admission to the hospital, however will require transfer to [facility] given her dialysis dependent.  Will consult hospitalist following completion of ED work-up.   Troponin normal, 14. Informed by RN that patient is still combative, unable to collect outstanding blood work or to perform CT scan. Will give Ativan and reassess.   Negative CT of the head.  Consult to hospitalist as above, who will admit the patient for AMS with hyperglycemic crisis. Patient will require admission to [facility] given dialysis dependence.  Continues to be altered though  blood glucose is improving, no significant electrolyte derangement.  No further work-up warranted needed this time.  Patient has been admitted to the hospital and remains in stable but serious condition.  This chart was dictated using voice recognition software, Dragon. Despite the best efforts of this provider to  proofread and correct errors, errors may still occur which can change documentation meaning.      Emeline Darling, PA-C 11/21/20 [place] A, DO 11/21/20 [phone]

## 2020-11-21 NOTE — Progress Notes (Addendum)
Patient arrived to room 5W in NAD, VS stable. Was not able to complete full admission assessment due to patient's LOC.

## 2020-11-21 NOTE — ED Provider Notes (Signed)
[facility] DEPT Provider Note   CSN: [id number] Arrival date & time: 11/21/20  0459     History Chief Complaint  Patient presents with   Hyperglycemia    [name] is a 50 y.o. female presents to the emergency department by EMS.  She is altered.  Hypertensive and tachycardic.  Tachypneic.  Agitated and uncooperative.  Temperature 99 degrees axillary.  EMS reports CBG greater than 600.  Level 5 caveat for altered mental status.  Per EMS, patient agitated on their arrival and dry heaving.  They report they were called out for hyperglycemia and patient wanted help with her insulin pump.  Per EMS patient's roommate administered 7 units of NovoLog 1 hour prior to EMS arrival.  Patient known dialysis patient but EMS is unsure of patient's dialysis schedule or last treatment.  Patient unable to answer any of my questions.  The history is provided by the patient and medical records. No language interpreter was used.      Past Medical History:  Diagnosis Date   Anemia    Arthritis    Bronchitis    C. difficile diarrhea 09/26/2014   Cataracts, both eyes    had surgery to remove   Cellulitis of right foot 06/02/2014   CKD (chronic kidney disease) stage 3, GFR 30-59 ml/min (HCC)    Dialysis T, TH, Sat   Diabetic ulcer of right foot ([employer]) 06/02/2014   DVT (deep venous thrombosis) ([place]) 10/2014   one in each one in leg- PICC line , one in right and left arm   H/O seasonal allergies    History of blood transfusion    Hypercholesteremia    Hypertension    Left foot infection    Neuromuscular disorder (HCC)    lower legs and feet   Neuropathy in diabetes [facility])    Peripheral vascular disease ([place])    Sleep apnea    does not use cpap   Staphylococcus aureus bacteremia 10/2014   Type 1 diabetes [facility])    onset age 48    Patient Active Problem List   Diagnosis Date Noted   Precordial chest pain 04/27/2020   Allergy, unspecified, initial encounter  11/26/2019   Anaphylactic shock, unspecified, initial encounter 11/26/2019   Encounter for removal of sutures 11/24/2019   Chronic venous hypertension (idiopathic) with ulcer and inflammation of unspecified lower extremity ([place]) 09/08/2019   ESRD (end stage renal disease) on dialysis ([place])    Anemia in chronic kidney disease 08/04/2018   Coagulation defect, unspecified ([place]) 08/04/2018   Secondary hyperparathyroidism of renal origin ([place]) 08/04/2018   Osteomyelitis of right foot ([place]) 07/29/2018   Menorrhagia with irregular cycle 11/27/2017   Gangrene of left foot ([place]) 05/08/2017   Osteomyelitis of left foot ([place]) 05/08/2017   Diabetic foot infection ([name])    Diabetic foot ulcer ([place]) 05/05/2017   Cellulitis 05/04/2017   Diabetes mellitus type 1 with peripheral artery disease ([place]) 01/16/2017   Anxiety 03/23/2016   Compulsive skin picking 03/23/2016   Sepsis due to methicillin resistant Staphylococcus aureus (MRSA) without acute organ dysfunction ([place]) 11/04/2015   Heart murmur 11/03/2015   Acute renal failure superimposed on stage 4 chronic kidney disease ([place]) 05/31/2015   PAD (peripheral artery disease) ([place]) 04/22/2015   Peripheral edema 12/28/2014   Protein-calorie malnutrition, moderate ([place]) 10/18/2014   History of DVT (deep vein thrombosis)    MRSA bacteremia    Diabetic infection of left foot ([place]) 10/13/2014   Chronic anemia 10/13/2014  Status post transmetatarsal amputation of right foot ([place]) 09/26/2014   Diabetes mellitus type 1, uncontrolled, insulin dependent ([place]) 11/09/2013   DM type 1 causing renal disease ([place]) 06/13/2013   CKD (chronic kidney disease), stage III ([place]) 06/12/2013   Charcot foot due to diabetes mellitus ([place]) 05/09/2011   Foot ulcer due to secondary DM ([place]) 05/09/2011   Type 1 diabetes mellitus with neurological manifestations, uncontrolled ([place])    Hypertension    Hypercholesteremia     Past Surgical History:  Procedure Laterality Date    AMPUTATION TOE Left 07/24/2015   5th toe and Hallux amputation   AV FISTULA PLACEMENT Left 08/06/2018   Procedure: INSERTION OF BASCILIC VEN TRANSPOSITION 1ST STAGE;  Surgeon: Rosetta Posner, MD;  [facility];  Service: Vascular;  Laterality: Left;   [place] Left 04/29/2019   Procedure: SECOND STAGE BASCILIC VEIN TRANSPOSITION LEFT ARM;  Surgeon: Rosetta Posner, MD;  Location[facility];  Service: Vascular;  Laterality: Left;   CESAREAN SECTION     x 1   EXOSTECTECTOMY TOE Left 04/11/2016   Procedure: EXOSTECTECTOMY CUBOID AND 5TH METATARSAL AND PLACEMENT OF ANTIBIOTIC BEADS;  Surgeon: Edrick Kins, DPM;  Location: [facility];  Service: Podiatry;  Laterality: Left;   EYE SURGERY Bilateral    removed cataracts - laser   FEMORAL-POPLITEAL BYPASS GRAFT Left 05/03/2015   FOOT AMPUTATION THROUGH METATARSAL Right 2016   hemrrhoidectomy     I & D EXTREMITY Right 06/10/2014   Procedure: IRRIGATION AND DEBRIDEMENT Right Foot;  Surgeon: Newt Minion, MD;  Location: [facility];  Service: Orthopedics;  Laterality: Right;   I & D EXTREMITY Right 07/31/2018   Procedure: IRRIGATION AND DEBRIDEMENT EXTREMITY;  Surgeon: Edrick Kins, DPM;  Location: [facility];  Service: Podiatry;  Laterality: Right;   I & D EXTREMITY Right 08/04/2018   Procedure: IRRIGATION AND DEBRIDEMENT FOOT with placement of wound vac;  Surgeon: Edrick Kins, DPM;  Location: [facility];  Service: Podiatry;  Laterality: Right;   INCISION AND DRAINAGE Left 04/11/2016   Procedure: INCISION AND DRAINAGE A DEEP COMPLICATED WOUND OF LEFT FOOT;  Surgeon: Edrick Kins, DPM;  Location: [facility];  Service: Podiatry;  Laterality: Left;   INSERTION OF DIALYSIS CATHETER Right 08/06/2018   Procedure: INSERTION OF TUNNELED DIALYSIS CATHETER;  Surgeon: Rosetta Posner, MD;  [facility];  Service: Vascular;  Laterality: Right;   IR FLUORO GUIDE CV LINE RIGHT  09/24/2017   IR FLUORO GUIDE CV LINE RIGHT  08/01/2018   IR REMOVAL TUN CV CATH W/O FL  11/22/2017   IR  US GUIDE VASC ACCESS RIGHT  09/24/2017   IR US GUIDE VASC ACCESS RIGHT  08/01/2018   IRRIGATION AND DEBRIDEMENT ABSCESS Left 05/06/2017   Procedure: IRRIGATION AND DEBRIDEMENT ABSCESS LEFT FOOT;  Surgeon: Edrick Kins, DPM;  Location: [facility];  Service: Podiatry;  Laterality: Left;   PERIPHERAL VASCULAR BALLOON ANGIOPLASTY Left 01/20/2020   Procedure: PERIPHERAL VASCULAR BALLOON ANGIOPLASTY;  Surgeon: Marty Heck, MD;  Location: [facility] CV LAB;  Service: Cardiovascular;  Laterality: Left;  Arm fistula   PERIPHERALLY INSERTED CENTRAL CATHETER INSERTION     SKIN GRAFT Right 06/15/2014   SKIN GRAFT Left 06/2015   foot    SKIN SPLIT GRAFT Right 06/15/2014   Procedure: SPLIT THICKNESS SKIN GRAFT RIGHT FOOT;  Surgeon: Newt Minion, MD;  Location: [facility];  Service: Orthopedics;  Laterality: Right;   TRANSMETATARSAL AMPUTATION Right  TRANSMETATARSAL AMPUTATION Right 09/11/2014   WISDOM TOOTH EXTRACTION       OB History     Gravida  1   Para      Term      Preterm      AB      Living         SAB      IAB      Ectopic      Multiple      Live Births              Family History  Problem Relation Age of Onset   Hyperlipidemia Mother    Dementia Mother     Social History   Tobacco Use   Smoking status: Former    Years: 15.00    Types: Cigarettes    Quit date: 05/16/2008    Years since quitting: 12.5   Smokeless tobacco: Never  Vaping Use   Vaping Use: Never used  Substance Use Topics   Alcohol use: Not Currently    Comment: Occasional   Drug use: No    Home Medications Prior to Admission medications   Medication Sig Start Date End Date Taking? Authorizing Provider  albuterol (PROVENTIL HFA;VENTOLIN HFA) 108 (90 BASE) MCG/ACT inhaler Inhale 2 puffs into the lungs every 6 (six) hours as needed for wheezing or shortness of breath.     [provider]  aspirin EC 81 MG tablet Take 81 mg by mouth daily.    [provider]  B  Complex-C-Zn-Folic Acid (DIALYVITE 672 WITH ZINC) 0.8 MG TABS Take 1 tablet by mouth daily. 03/09/19   [provider]  calcitRIOL (ROCALTROL) 0.5 MCG capsule Take 1 capsule daily 08/13/17   Elayne Snare, MD  calcium acetate (PHOSLO) 667 MG capsule Take 2 capsules (1,334 mg total) by mouth 3 (three) times daily with meals. 08/08/18   Danford, Suann Larry, MD  Continuous Blood Gluc Receiver ([place]) [place] 1 each by Does not apply route See admin instructions. Use Dexcom Receiver to monitor blood sugar continuously. 05/25/19   Elayne Snare, MD  Continuous Blood Gluc Sensor (DEXCOM G6 SENSOR) MISC 1 each by Does not apply route See admin instructions. Use one sensors once every 10 days to monitor blood sugars. 05/25/19   Elayne Snare, MD  Continuous Blood Gluc Transmit (DEXCOM G6 TRANSMITTER) MISC 1 each by Does not apply route See admin instructions. Use one transmitter once every 90 days. 05/25/19   Elayne Snare, MD  CONTOUR NEXT TEST test strip USE AS INSTRUCTED TO CHECK BLOOD SUGAR 4 TIMES A DAY. DX E10.65 06/08/19   Elayne Snare, MD  Cyanocobalamin (B-12) 5000 MCG CAPS Take 5,000 mcg by mouth daily.    [provider]  diphenhydrAMINE (BENADRYL) 25 MG tablet Take 25 mg by mouth daily as needed.     [provider]  folic acid (FOLVITE) 094 MCG tablet Take 400 mcg by mouth daily.    [provider]  heparin 1000 unit/ml SOLN injection Heparin Sodium (Porcine) 1,000 Units/mL Catheter Lock Arterial 02/18/20 02/16/21  [provider]  insulin aspart (NOVOLOG) 100 UNIT/ML injection USE A MAX OF 65 UNITS DAILY VIA INSULIN PUMP 06/24/20   Elayne Snare, MD  insulin aspart (NOVOLOG) 100 UNIT/ML injection USE A MAX OF 65 UNITS DAILY VIA INSULIN PUMP 11/07/20   Elayne Snare, MD  Insulin Human (INSULIN PUMP) SOLN Inject into the skin. Insulin aspart (Novolog) 100 unit/ml---MAX 65 UNITS DAILY    [provider]  norethindrone (AYGESTIN) 5 MG tablet Take 5 mg by  mouth See admin instructions.  02/17/19   [provider]  Omega-3 Fatty Acids (OMEGA 3 500) 500 MG CAPS Take 500 mg by mouth daily.    [provider]  ondansetron (ZOFRAN ODT) 4 MG disintegrating tablet Take 1 tablet (4 mg total) by mouth every 8 (eight) hours as needed for nausea or vomiting. 10/08/20   Luna Fuse, MD  pravastatin (PRAVACHOL) 40 MG tablet Take 1 tablet (40 mg total) by mouth daily. 11/07/20   Elayne Snare, MD  sulfamethoxazole-trimethoprim (BACTRIM DS) 800-160 MG tablet Take 1 tablet by mouth daily.    [provider]    Allergies    Cleocin [clindamycin hcl], Lisinopril, Amoxicillin, and Bactrim [sulfamethoxazole-trimethoprim]  Review of Systems   Review of Systems  Unable to perform ROS: Mental status change   Physical Exam Updated Vital Signs BP (!) 175/130   Pulse (!) 110   Temp 99 F (37.2 C) (Axillary)   Resp (!) 24   SpO2 100%   Physical Exam Vitals and nursing note reviewed.  Constitutional:      General: She is not in acute distress.    Appearance: She is ill-appearing. She is not diaphoretic.  HENT:     Head: Normocephalic.     Mouth/Throat:     Mouth: Mucous membranes are dry.  Eyes:     General: No scleral icterus.    Conjunctiva/sclera: Conjunctivae normal.  Cardiovascular:     Rate and Rhythm: Regular rhythm. Tachycardia present.     Pulses: Normal pulses.          Radial pulses are 2+ on the right side and 2+ on the left side.  Pulmonary:     Effort: Tachypnea present. No accessory muscle usage, prolonged expiration, respiratory distress or retractions.     Breath sounds: No stridor.     Comments: Equal chest rise. Mild increased work of breathing. Abdominal:     General: There is no distension.     Palpations: Abdomen is soft.     Tenderness: There is abdominal tenderness. There is no guarding or rebound.    Musculoskeletal:     Cervical back: Normal range of motion.     Comments: Moves all extremities  equally and without difficulty. Healing wounds noted to the bilateral feet.  No evidence of worsening infection to either foot.  No increased erythema, induration or streaking.  Skin:    General: Skin is warm and dry.     Capillary Refill: Capillary refill takes less than 2 seconds.     Comments: Hot to touch  Neurological:     Mental Status: She is alert.     GCS: GCS eye subscore is 4. GCS verbal subscore is 5. GCS motor subscore is 6.     Comments: Speech is clear and goal oriented.  Psychiatric:        Mood and Affect: Mood normal.    ED Results / Procedures / Treatments   Labs (all labs ordered are listed, but only abnormal results are displayed) Labs Reviewed  LACTIC ACID, PLASMA - Abnormal; Notable for the following components:      Result Value   Lactic Acid, Venous 3.8 (*)    All other components within normal limits  COMPREHENSIVE METABOLIC PANEL - Abnormal; Notable for the following components:   Sodium 124 (*)    Chloride 84 (*)    CO2 18 (*)    Glucose,  Bld 775 (*)    BUN 57 (*)    Creatinine, Ser 7.19 (*)    Calcium 8.6 (*)    ALT 45 (*)    Alkaline Phosphatase 242 (*)    GFR, Estimated 6 (*)    Anion gap 22 (*)    All other components within normal limits  CBC WITH DIFFERENTIAL/PLATELET - Abnormal; Notable for the following components:   RBC 3.67 (*)    Hemoglobin 11.0 (*)    HCT 32.8 (*)    Neutro Abs 8.6 (*)    All other components within normal limits  BETA-HYDROXYBUTYRIC ACID - Abnormal; Notable for the following components:   Beta-Hydroxybutyric Acid 1.07 (*)    All other components within normal limits  BLOOD GAS, VENOUS - Abnormal; Notable for the following components:   pCO2, Ven 38.0 (*)    pO2, Ven 76.8 (*)    Bicarbonate 18.3 (*)    Acid-base deficit 7.0 (*)    All other components within normal limits  CBG MONITORING, ED - Abnormal; Notable for the following components:   Glucose-Capillary >600 (*)    All other components within normal  limits  CULTURE, BLOOD (SINGLE)  CULTURE, BLOOD (SINGLE)  PROTIME-INR  APTT  LIPASE, BLOOD  LACTIC ACID, PLASMA  URINALYSIS, ROUTINE W REFLEX MICROSCOPIC  BETA-HYDROXYBUTYRIC ACID  BETA-HYDROXYBUTYRIC ACID  BETA-HYDROXYBUTYRIC ACID  CBC WITH DIFFERENTIAL/PLATELET  URINALYSIS, ROUTINE W REFLEX MICROSCOPIC  BLOOD GAS, VENOUS  I-STAT BETA HCG BLOOD, ED ([facility], [facility], [facility] ONLY)  I-STAT BETA HCG BLOOD, ED ([facility], [facility], [facility] ONLY)  CBG MONITORING, ED  TROPONIN I (HIGH SENSITIVITY)    EKG EKG Interpretation  Date/Time:  Monday November 21 2020 05:59:12 EDT Ventricular Rate:  102 PR Interval:  153 QRS Duration: 82 QT Interval:  344 QTC Calculation: 449 R Axis:   79 Text Interpretation: Sinus tachycardia Nonspecific T abnormalities, inferior leads Confirmed by Quintella Reichert [phone]) on 11/21/2020 6:04:12 AM  Radiology DG Chest Port 1 View  Result Date: 11/21/2020 CLINICAL DATA:  50 year old female with possible sepsis. EXAM: PORTABLE CHEST 1 VIEW COMPARISON:  Chest x-ray 08/06/2018. FINDINGS: Lung volumes are normal. No consolidative airspace disease. No pleural effusions. No pneumothorax. No definite suspicious appearing pulmonary nodules or masses are noted. Cephalization of the pulmonary vasculature, without frank pulmonary edema. Heart size is mildly enlarged. Upper mediastinal contours are within normal limits. Vascular stent projecting over the left axillary region. IMPRESSION: 1. Cardiomegaly with pulmonary venous congestion, but no frank pulmonary edema. Electronically Signed   By: Vinnie Langton M.D.   On: 11/21/2020 06:20   DG Foot Complete Left  Result Date: 11/21/2020 CLINICAL DATA:  Chronic infection. EXAM: LEFT FOOT - COMPLETE 3+ VIEW COMPARISON:  01/09/2019 FINDINGS: Generalized osteopenia. 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 There is soft tissue swelling with extensive bony erosion involving the distal second digit. The head of the proximal phalanx  is eroded and the other phalanges are barely visible. No opaque foreign body. Similar mildly swollen appearance of the great toe stump. 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 Electronically Signed   By: Monte Fantasia M.D.   On: 11/21/2020 06:21    Procedures .Critical Care  Date/Time: 11/21/2020 5:20 AM Performed by: Abigail Butts, PA-C Authorized by: Abigail Butts, PA-C   Critical care provider statement:    Critical care time (minutes):  60   Critical care time was exclusive of:  Separately billable procedures and treating other patients and teaching time  Critical care was necessary to treat or prevent imminent or life-threatening deterioration of the following conditions:  Endocrine crisis, dehydration and renal failure   Critical care was time spent personally by me on the following activities:  Discussions with consultants, evaluation of patient's response to treatment, examination of patient, ordering and performing treatments and interventions, ordering and review of laboratory studies, ordering and review of radiographic studies, pulse oximetry, re-evaluation of patient's condition, obtaining history from patient or surrogate and review of old charts   I assumed direction of critical care for this patient from another provider in my specialty: no     Medications Ordered in ED Medications  LORazepam (ATIVAN) 2 MG/ML injection (  Not Given 11/21/20 0626)  lactated ringers infusion (has no administration in time range)  lactated ringers bolus 250 mL (has no administration in time range)  metroNIDAZOLE (FLAGYL) IVPB 500 mg (has no administration in time range)  insulin regular, human (MYXREDLIN) 100 units/ 100 mL infusion (has no administration in time range)  lactated ringers infusion (has no administration in time range)  dextrose 5 % in lactated ringers infusion (has no administration in time range)  dextrose 50 % solution 0-50 mL (has no  administration in time range)  potassium chloride 10 mEq in 100 mL IVPB (has no administration in time range)  vancomycin (VANCOCIN) IVPB 1000 mg/200 mL premix (has no administration in time range)  ceFEPIme (MAXIPIME) 2 g in sodium chloride 0.9 % 100 mL IVPB (has no administration in time range)    ED Course  I have reviewed the triage vital signs and the nursing notes.  Pertinent labs & imaging results that were available during my care of the patient were reviewed by me and considered in my medical decision making (see chart for details).    MDM Rules/Calculators/A&P                           Patient presents to the emergency department critically ill.  She is hyperglycemic and altered.  Hot to touch.  Hypertensive, tachycardic and tachypneic.  Unknown last dialysis session.  Patient without leukocytosis.  CMP with glucose of 775, potassium 4.3.  Sodium of 124, chloride of 84.  Creatinine 7.19 however patient is on dialysis.  Elevated ALT and alk phos with normal total bili.  Elevated anion gap.  Patient started on the glucose stabilizer.  Antibiotics for sepsis given per pharmacy.  Fluid bolus restricted as patient is not in septic shock.  She does not have hypotension and is a dialysis patient with unknown last dialysis.  Chest x-ray with pulmonary vascular congestion.  We will add fluids as needed.  Patient is critically ill and will need to be admitted.  6:54 AM At shift change care was transferred to [facility] who will follow pending studies, re-evaulate and determine disposition.      Final Clinical Impression(s) / ED Diagnoses Final diagnoses:  Diabetic ketoacidosis with coma associated with type 1 diabetes mellitus ([name])  ESRD (end stage renal disease) on dialysis ([place])  Sepsis, due to unspecified organism, unspecified whether acute organ dysfunction present ([place])  Altered mental status, unspecified altered mental status type    Rx / DC Orders ED Discharge Orders      None        Avamae Dehaan, Gwenlyn Perking 11/21/20 [id number]    Quintella Reichert, MD 11/23/20 0425

## 2020-11-21 NOTE — Consult Note (Signed)
Renal Service Consult Note [name]  [name] 11/21/2020 Sol Blazing, MD Requesting Physician: Dr Dwyane Dee  Reason for Consult: ESRD pt w/ AMS and DKA HPI: The patient is a 50 y.o. year-old w/ hx of Cdif, ESRD on HD, DVT, HL, HTN, L TMA, PAD, DM type 1 admitted today for DKA 1 associated w/ AMS, L foot osteo and sepsis picture. Asked to see for ESRD.     In ED K+ was mid 4's.  Pt is not responsive, obtunded but not in distress. Getting IV vanc/ maxipime.   ROS - na/  Past Medical History  Past Medical History:  Diagnosis Date   Anemia    Arthritis    Bronchitis    C. difficile diarrhea 09/26/2014   Cataracts, both eyes    had surgery to remove   Cellulitis of right foot 06/02/2014   CKD (chronic kidney disease) stage 3, GFR 30-59 ml/min (HCC)    Dialysis T, TH, Sat   Diabetic ulcer of right foot ([place]) 06/02/2014   DVT (deep venous thrombosis) ([place]) 10/2014   one in each one in leg- PICC line , one in right and left arm   H/O seasonal allergies    History of blood transfusion    Hypercholesteremia    Hypertension    Left foot infection    Neuromuscular disorder (HCC)    lower legs and feet   Neuropathy in diabetes ([place])    Peripheral vascular disease (HCC)    Sleep apnea    does not use cpap   Staphylococcus aureus bacteremia 10/2014   Type 1 diabetes ([place])    onset age 20   Past Surgical History  Past Surgical History:  Procedure Laterality Date   AMPUTATION TOE Left 07/24/2015   5th toe and Hallux amputation   AV FISTULA PLACEMENT Left 08/06/2018   Procedure: INSERTION OF BASCILIC VEN TRANSPOSITION 1ST STAGE;  Surgeon: Rosetta Posner, MD;  Location: [facility];  Service: Vascular;  Laterality: Left;   [place] Left 04/29/2019   Procedure: SECOND STAGE BASCILIC VEIN TRANSPOSITION LEFT ARM;  Surgeon: Rosetta Posner, MD;  Location: [facility];  Service: Vascular;  Laterality: Left;   CESAREAN SECTION     x 1   EXOSTECTECTOMY TOE  Left 04/11/2016   Procedure: EXOSTECTECTOMY CUBOID AND 5TH METATARSAL AND PLACEMENT OF ANTIBIOTIC BEADS;  Surgeon: Edrick Kins, DPM;  Location: [place];  Service: Podiatry;  Laterality: Left;   EYE SURGERY Bilateral    removed cataracts - laser   FEMORAL-POPLITEAL BYPASS GRAFT Left 05/03/2015   FOOT AMPUTATION THROUGH METATARSAL Right 2016   hemrrhoidectomy     I & D EXTREMITY Right 06/10/2014   Procedure: IRRIGATION AND DEBRIDEMENT Right Foot;  Surgeon: Newt Minion, MD;  Location: [place];  Service: Orthopedics;  Laterality: Right;   I & D EXTREMITY Right 07/31/2018   Procedure: IRRIGATION AND DEBRIDEMENT EXTREMITY;  Surgeon: Edrick Kins, DPM;  Location: [place];  Service: Podiatry;  Laterality: Right;   I & D EXTREMITY Right 08/04/2018   Procedure: IRRIGATION AND DEBRIDEMENT FOOT with placement of wound vac;  Surgeon: Edrick Kins, DPM;  Location: [place];  Service: Podiatry;  Laterality: Right;   INCISION AND DRAINAGE Left 04/11/2016   Procedure: INCISION AND DRAINAGE A DEEP COMPLICATED WOUND OF LEFT FOOT;  Surgeon: Edrick Kins, DPM;  Location: [place];  Service: Podiatry;  Laterality: Left;   INSERTION OF DIALYSIS CATHETER Right 08/06/2018  Procedure: INSERTION OF TUNNELED DIALYSIS CATHETER;  Surgeon: Rosetta Posner, MD;  [facility];  Service: Vascular;  Laterality: Right;   IR FLUORO GUIDE CV LINE RIGHT  09/24/2017   IR FLUORO GUIDE CV LINE RIGHT  08/01/2018   IR REMOVAL TUN CV CATH W/O FL  11/22/2017   IR US GUIDE VASC ACCESS RIGHT  09/24/2017   IR US GUIDE VASC ACCESS RIGHT  08/01/2018   IRRIGATION AND DEBRIDEMENT ABSCESS Left 05/06/2017   Procedure: IRRIGATION AND DEBRIDEMENT ABSCESS LEFT FOOT;  Surgeon: Edrick Kins, DPM;  Location: [facility];  Service: Podiatry;  Laterality: Left;   PERIPHERAL VASCULAR BALLOON ANGIOPLASTY Left 01/20/2020   Procedure: PERIPHERAL VASCULAR BALLOON ANGIOPLASTY;  Surgeon: Marty Heck, MD;  Location: [facility] [facility];  Service: Cardiovascular;   Laterality: Left;  Arm fistula   PERIPHERALLY INSERTED CENTRAL CATHETER INSERTION     SKIN GRAFT Right 06/15/2014   SKIN GRAFT Left 06/2015   foot    SKIN SPLIT GRAFT Right 06/15/2014   Procedure: SPLIT THICKNESS SKIN GRAFT RIGHT FOOT;  Surgeon: Newt Minion, MD;  Location: [place];  Service: Orthopedics;  Laterality: Right;   TRANSMETATARSAL AMPUTATION Right    TRANSMETATARSAL AMPUTATION Right 09/11/2014   WISDOM TOOTH EXTRACTION     Family History  Family History  Problem Relation Age of Onset   Hyperlipidemia Mother    Dementia Mother    Social History  reports that she quit smoking about 12 years ago. Her smoking use included cigarettes. She has never used smokeless tobacco. She reports that she does not currently use alcohol. She reports that she does not use drugs. Allergies  Allergies  Allergen Reactions   Cleocin [Clindamycin Hcl] Diarrhea   Lisinopril Other (See Comments)    Elevated potassium per pt report   Amoxicillin Diarrhea, Nausea Only and Other (See Comments)    Did it involve swelling of the face/tongue/throat, SOB, or low BP? No Did it involve sudden or severe rash/hives, skin peeling, or any reaction on the inside of your mouth or nose? No Did you need to seek medical attention at a hospital or doctor's office? No When did it last happen?      10 + years If all above answers are "NO", may proceed with cephalosporin use.     Bactrim [Sulfamethoxazole-Trimethoprim] Diarrhea and Nausea Only   Home medications Prior to Admission medications   Medication Sig Start Date End Date Taking? Authorizing Provider  albuterol (PROVENTIL HFA;VENTOLIN HFA) 108 (90 BASE) MCG/ACT inhaler Inhale 2 puffs into the lungs every 6 (six) hours as needed for wheezing or shortness of breath.     [provider]  aspirin EC 81 MG tablet Take 81 mg by mouth daily.    [provider]  B Complex-C-Zn-Folic Acid (DIALYVITE 283 WITH ZINC) 0.8 MG TABS Take 1 tablet by mouth  daily. 03/09/19   [provider]  calcitRIOL (ROCALTROL) 0.5 MCG capsule Take 1 capsule daily 08/13/17   Elayne Snare, MD  calcium acetate (PHOSLO) 667 MG capsule Take 2 capsules (1,334 mg total) by mouth 3 (three) times daily with meals. 08/08/18   [place], Suann Larry, MD  Continuous Blood Gluc Receiver ([place]) [place] 1 each by Does not apply route See admin instructions. Use Dexcom Receiver to monitor blood sugar continuously. 05/25/19   Elayne Snare, MD  Continuous Blood Gluc Sensor (DEXCOM G6 SENSOR) MISC 1 each by Does not apply route See admin instructions. Use one sensors once  every 10 days to monitor blood sugars. 05/25/19   Elayne Snare, MD  Continuous Blood Gluc Transmit (DEXCOM G6 TRANSMITTER) MISC 1 each by Does not apply route See admin instructions. Use one transmitter once every 90 days. 05/25/19   Elayne Snare, MD  CONTOUR NEXT TEST test strip USE AS INSTRUCTED TO CHECK BLOOD SUGAR 4 TIMES A DAY. DX E10.65 06/08/19   Elayne Snare, MD  Cyanocobalamin (B-12) 5000 MCG CAPS Take 5,000 mcg by mouth daily.    [provider]  diphenhydrAMINE (BENADRYL) 25 MG tablet Take 25 mg by mouth daily as needed.     [provider]  folic acid (FOLVITE) 947 MCG tablet Take 400 mcg by mouth daily.    [provider]  heparin 1000 unit/ml SOLN injection Heparin Sodium (Porcine) 1,000 Units/mL Catheter Lock Arterial 02/18/20 02/16/21  [provider]  insulin aspart (NOVOLOG) 100 UNIT/ML injection USE A MAX OF 65 UNITS DAILY VIA INSULIN PUMP 06/24/20   Elayne Snare, MD  insulin aspart (NOVOLOG) 100 UNIT/ML injection USE A MAX OF 65 UNITS DAILY VIA INSULIN PUMP 11/07/20   Elayne Snare, MD  Insulin Human (INSULIN PUMP) SOLN Inject into the skin. Insulin aspart (Novolog) 100 unit/ml---MAX 65 UNITS DAILY    [provider]  norethindrone (AYGESTIN) 5 MG tablet Take 5 mg by mouth See admin instructions. 5mg  daily for 14 days, then off for 14 days, then repeat  cycle. 02/17/19   [provider]  Omega-3 Fatty Acids (OMEGA 3 500) 500 MG CAPS Take 500 mg by mouth daily.    [provider]  ondansetron (ZOFRAN ODT) 4 MG disintegrating tablet Take 1 tablet (4 mg total) by mouth every 8 (eight) hours as needed for nausea or vomiting. 10/08/20   Luna Fuse, MD  pravastatin (PRAVACHOL) 40 MG tablet Take 1 tablet (40 mg total) by mouth daily. 11/07/20   Elayne Snare, MD     Vitals:   11/21/20 1148 11/21/20 1200 11/21/20 1300 11/21/20 1428  BP: (!) 168/76 (!) 120/98 (!) 152/81 (!) 156/89  Pulse: 86 85 82   Resp: 14 15 14 17   Temp:    98.6 F (37 C)  TempSrc:    Axillary  SpO2: 97% 98% 95% 97%  Weight:      Height:       Exam Gen no distress, not responsive, nasal O2,  lying on her side, eyes closed No rash, cyanosis or gangrene No jvd or bruits Chest clear bilat to bases, no rales/ wheezing RRR no MRG Abd soft ntnd no mass or ascites +bs GU defer MS no joint effusions or deformity Ext 1+ LE edema, L foot TMA Neuro is alert, Ox 3 , nf  LUA AVF+bruit   Home meds: asa, phoslo 2 ac , insulin pump, pravachol, prn's   OP HD: AF TTS   4h  56kg  2/2 bath Hep none LUA AVF   - hect 9 ug IV tiw   Assessment/ Plan: DKA / type 1 DM Hyponatremia/ hyperkalemia - mild of both, both should correct w/ IV insulin AMS - due to #1 and /or infection L foot osteomyelitis - on IV vanc/ cefepime ESRD - on HD TTS.  HD tomorrow.  Anemia ckd - Hb 11, transfuse prn' MBD ckd - resume binders when eating      Kelly Splinter  MD 11/21/2020, 3:25 PM  Recent Labs  Lab 11/21/20 0550 11/21/20 1227  WBC 10.3 7.8  HGB 11.0* 10.2*   Recent Labs  Lab 11/21/20 0550 11/21/20 1227  K 4.3 5.5*  BUN 57* 58*  CREATININE 7.19* 6.91*  CALCIUM 8.6* 8.3*

## 2020-11-21 NOTE — Progress Notes (Signed)
Patient's sister Estill Batten updated on patient status.

## 2020-11-21 NOTE — Progress Notes (Signed)
A consult was received from an ED physician ([place]) for aztreonam and vancomycin per pharmacy dosing.    The patient's profile has been reviewed for ht/wt/allergies/indication/available labs.  PCN allergy reaction listed as diarrhea/nausea and has history of receiving cefepime in the past.  [place] agreed to change aztreonam to cefepime.  A one time order has been placed for cefepime 2 g IV and vancomycin 1 g IV.  Further antibiotics/pharmacy consults should be ordered by admitting physician if indicated.                       Thank you, Suzzanne Cloud, PharmD, BCPS Clinical Pharmacist [place] Please utilize Amion for appropriate phone number to reach the unit pharmacist ([place]) 11/21/2020 7:25 AM

## 2020-11-21 NOTE — ED Notes (Signed)
Pt agitated and combative due to her confused state so multiple staff members held pt down while charge nurse attempted to start IV and blood draw. Multiple staff members also utilized to maintain pt still for EKG and x-ray images.

## 2020-11-21 NOTE — Sepsis Progress Note (Addendum)
Notified bedside nurse of need to draw repeat lactic acid.   Bedside nurse states that patient is very agitated and unable to draw at this time. She is reaching out to provider to make aware.

## 2020-11-21 NOTE — Progress Notes (Signed)
PHARMACY NOTE:  ANTIMICROBIAL RENAL DOSAGE ADJUSTMENT  Current antimicrobial regimen includes a mismatch between antimicrobial dosage and estimated renal function.  As per policy approved by the Pharmacy & Therapeutics and Medical Executive Committees, the antimicrobial dosage will be adjusted accordingly.  Current antimicrobial dosage:  Cefepime 2 gm IV Q 24 hrs  Indication:  Osteomyelitis  Renal Function:  Estimated Creatinine Clearance: 8.6 mL/min (A) (by C-G formula based on SCr of 7.56 mg/dL (H)). [x]      On intermittent HD, scheduled: TTS []      On CRRT    Antimicrobial dosage has been changed to:  Cefepime 2 gm IV TTS after each HD  Thank you for allowing pharmacy to be a part of this patient's care.  Gillermina Hu, PharmD, BCPS, [facility] Clinical Pharmacist 11/21/2020 8:18 PM

## 2020-11-21 NOTE — ED Notes (Signed)
PA is aware of inability to collect blood or obtain head CT d/t agitation.  Also discussed w/ PA limited venous access to administer IV medications ordered.

## 2020-11-21 NOTE — ED Notes (Signed)
Accidental click-off of IV application, disregard.

## 2020-11-21 NOTE — Consult Note (Addendum)
WOC Nurse Consult Note: Reason for Consult: Consult requested for left 2nd toe and right inner ankle wounds. Left 2nd toe with dry callous/eschar .8X.8cm to anterior toe:  pt has osteomyelitis, according to X-ray results.  This complex medical condition is beyond the scope of practice for [facility] nurses.  Please refer to podiatry service for further plan of care for this location; EMR indicates there is a consult pending from their team.   Wound type: Right inner ankle with several full thickness wounds; 2 have 100% tightly adhered eschar 3X3cm, also nearby.8X.8cm.  No odor, drainage, or fluctuance.  2 Full thickness wounds located in close proximity; .2X.2X.2cm and .1X.3X.2cm, both red and moist.  Dressing procedure/placement/frequency: Topical treatment orders provided for bedside nurses to perform as follows to provide enzymatic debridement: Apply Santyl to right inner ankle black wounds Q day, then cover with moist gauze and foam dressing.  (Change foam dressing Q 3 days or PRN soiling.) Please re-consult if further assistance is needed.  Thank-you,  Julien Girt MSN, [place], [place], [place], [place]

## 2020-11-21 NOTE — ED Notes (Signed)
Attempted blood draw again, pt remains to combative to obtain blood at this time.

## 2020-11-21 NOTE — H&P (Addendum)
History and Physical    [name] ZHG:[id number] DOB: 1970-07-22 DOA: 11/21/2020  PCP: Loyola Mast, PA-C  Patient coming from: HOME  I have personally briefly reviewed patient's old medical records in [place]  Chief Complaint: elevated bs  HPI: [name] is a 50 y.o. female with medical history significant of poorly controlled diabetes type 1 presents to the ED via EMS for altered mental status and elevated blood sugar.  Patient apparently had elevated blood sugar at home and her roommate gave her 7 units of NovoLog.  EMS blood sugar greater than 600 and patient was brought in initial note by ED staff stating patient is agitated and uncooperative.  Patient is also end-stage renal disease, unknown last dialysis.  Blood sugars 600 to 556.  pH 7.304 PCO2 38-76.8 bicarb 18 sats 93 temperature 98.6.  Sodium 124 potassium 4.3 chloride 94 CO2 18 glucose 775 BUN 57 creatinine 7 gap of 22 alk phos 242 albumin 3.9 lipase 40 AST 28 ALT 40 5T bili 0.7 protein 8.1 GFR 6.  H&H 11 and 32 white count of 10 platelets 246.  Beta hydroxybutyrate 7.5 pregnancy test negative.  Lactic acid 3.8.  Chest x-ray showing cardiomegaly with pulmonary venous congestion.  Osteo on second digit/phalanges left foot.  Status post blood cultures.  Hemoglobin A1c 8.1 from November 02, 2020.  Patient started on insulin drip in the ED also potassium 2 mEq an hour every hour for 2 doses total.  Patient has aztreonam, cefepime, bank and Flagyl ordered in the ED.  Febrile 101 in the ED heart rate 110 respiration 24 blood pressure was 190s over 100.  Patient was agitated received 2 doses of Ativan in the ED.  Patient is lethargic, withdraws to tactile stimuli on my exam.  Blood pressure is 196 systolic on my exam.  Rate of 3.8units/hr on drip.   ED Course: As noted above  Review of Systems: Unable to complete due to patient's mentation.  No family at bedside.  Past Medical History:  Diagnosis Date   Anemia     Arthritis    Bronchitis    C. 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[facility])    Peripheral vascular disease (HCC)    Sleep apnea    does not use cpap   Staphylococcus aureus bacteremia 10/2014   Type 1 diabetes [facility])    onset age 67    Past Surgical History:  Procedure Laterality Date   AMPUTATION TOE Left 07/24/2015   5th toe and Hallux amputation   AV FISTULA PLACEMENT Left 08/06/2018   Procedure: INSERTION OF BASCILIC VEN TRANSPOSITION 1ST STAGE;  Surgeon: Rosetta Posner, MD;  Location: [place];  Service: Vascular;  Laterality: Left;   [place] Left 04/29/2019   Procedure: SECOND STAGE BASCILIC VEIN TRANSPOSITION LEFT ARM;  Surgeon: Donnetta Hutching,  Arvilla Meres, MD;  Location: [place];  Service: Vascular;  Laterality: Left;   CESAREAN SECTION     x 1   EXOSTECTECTOMY TOE Left 04/11/2016   Procedure: EXOSTECTECTOMY CUBOID AND 5TH METATARSAL AND PLACEMENT OF ANTIBIOTIC BEADS;  Surgeon: Edrick Kins, DPM;  Location: [place];  Service: Podiatry;  Laterality: Left;   EYE SURGERY Bilateral    removed cataracts - laser   FEMORAL-POPLITEAL BYPASS GRAFT Left 05/03/2015   FOOT AMPUTATION THROUGH METATARSAL Right 2016   hemrrhoidectomy     I & D EXTREMITY Right 06/10/2014   Procedure: IRRIGATION AND DEBRIDEMENT Right Foot;  Surgeon: Newt Minion, MD;  Location: [place];  Service: Orthopedics;  Laterality: Right;   I & D EXTREMITY Right 07/31/2018   Procedure: IRRIGATION AND DEBRIDEMENT  EXTREMITY;  Surgeon: Edrick Kins, DPM;  Location: [place];  Service: Podiatry;  Laterality: Right;   I & D EXTREMITY Right 08/04/2018   Procedure: IRRIGATION AND DEBRIDEMENT FOOT with placement of wound vac;  Surgeon: Edrick Kins, DPM;  Location: [place];  Service: Podiatry;  Laterality: Right;   INCISION AND DRAINAGE Left 04/11/2016   Procedure: INCISION AND DRAINAGE A DEEP COMPLICATED WOUND OF LEFT FOOT;  Surgeon: Edrick Kins, DPM;  Location: [place];  Service: Podiatry;  Laterality: Left;   INSERTION OF DIALYSIS CATHETER Right 08/06/2018   Procedure: INSERTION OF TUNNELED DIALYSIS CATHETER;  Surgeon: Rosetta Posner, MD;  [facility];  Service: Vascular;  Laterality: Right;   IR FLUORO GUIDE CV LINE RIGHT  09/24/2017   IR FLUORO GUIDE CV LINE RIGHT  08/01/2018   IR REMOVAL TUN CV CATH W/O FL  11/22/2017   IR US GUIDE VASC ACCESS RIGHT  09/24/2017   IR US GUIDE VASC ACCESS RIGHT  08/01/2018   IRRIGATION AND DEBRIDEMENT ABSCESS Left 05/06/2017   Procedure: IRRIGATION AND DEBRIDEMENT ABSCESS LEFT FOOT;  Surgeon: Edrick Kins, DPM;  Location: [facility];  Service: Podiatry;  Laterality: Left;   PERIPHERAL VASCULAR BALLOON ANGIOPLASTY Left 01/20/2020   Procedure: PERIPHERAL VASCULAR BALLOON ANGIOPLASTY;  Surgeon: Marty Heck, MD;  Location: [place] CV LAB;  Service: Cardiovascular;  Laterality: Left;  Arm fistula   PERIPHERALLY INSERTED CENTRAL CATHETER INSERTION     SKIN GRAFT Right 06/15/2014   SKIN GRAFT Left 06/2015   foot    SKIN SPLIT GRAFT Right 06/15/2014   Procedure: SPLIT THICKNESS SKIN GRAFT RIGHT FOOT;  Surgeon: Newt Minion, MD;  Location: [place];  Service: Orthopedics;  Laterality: Right;   TRANSMETATARSAL AMPUTATION Right    TRANSMETATARSAL AMPUTATION Right 09/11/2014   WISDOM TOOTH EXTRACTION      Social History  reports that she quit smoking about 12 years ago. Her smoking use included cigarettes. She has never used smokeless tobacco. She reports that she does not currently  use alcohol. She reports that she does not use drugs.  Allergies  Allergen Reactions   Cleocin [Clindamycin Hcl] Diarrhea   Lisinopril Other (See Comments)    Elevated potassium per pt report   Amoxicillin Diarrhea, Nausea Only and Other (See Comments)    Did it involve swelling of the face/tongue/throat, SOB, or low BP? No Did it involve sudden or severe rash/hives, skin peeling, or any reaction on the inside of your mouth or nose? No Did you need to seek medical attention at a hospital or doctor's office? No When did it last happen?      10 + years If all above answers  are "NO", may proceed with cephalosporin use.     Bactrim [Sulfamethoxazole-Trimethoprim] Diarrhea and Nausea Only    Family History  Problem Relation Age of Onset   Hyperlipidemia Mother    Dementia Mother   No known fh of dm   Prior to Admission medications   Medication Sig Start Date End Date Taking? Authorizing Provider  albuterol (PROVENTIL HFA;VENTOLIN HFA) 108 (90 BASE) MCG/ACT inhaler Inhale 2 puffs into the lungs every 6 (six) hours as needed for wheezing or shortness of breath.     [provider]  aspirin EC 81 MG tablet Take 81 mg by mouth daily.    [provider]  B Complex-C-Zn-Folic Acid (DIALYVITE 678 WITH ZINC) 0.8 MG TABS Take 1 tablet by mouth daily. 03/09/19   [provider]  calcitRIOL (ROCALTROL) 0.5 MCG capsule Take 1 capsule daily 08/13/17   Elayne Snare, MD  calcium acetate (PHOSLO) 667 MG capsule Take 2 capsules (1,334 mg total) by mouth 3 (three) times daily with meals. 08/08/18   Danford, Suann Larry, MD  Continuous Blood Gluc Receiver ([place]) [place] 1 each by Does not apply route See admin instructions. Use Dexcom Receiver to monitor blood sugar continuously. 05/25/19   Elayne Snare, MD  Continuous Blood Gluc Sensor (DEXCOM G6 SENSOR) MISC 1 each by Does not apply route See admin instructions. Use one sensors once every 10 days to monitor blood sugars.  05/25/19   Elayne Snare, MD  Continuous Blood Gluc Transmit (DEXCOM G6 TRANSMITTER) MISC 1 each by Does not apply route See admin instructions. Use one transmitter once every 90 days. 05/25/19   Elayne Snare, MD  CONTOUR NEXT TEST test strip USE AS INSTRUCTED TO CHECK BLOOD SUGAR 4 TIMES A DAY. DX E10.65 06/08/19   Elayne Snare, MD  Cyanocobalamin (B-12) 5000 MCG CAPS Take 5,000 mcg by mouth daily.    [provider]  diphenhydrAMINE (BENADRYL) 25 MG tablet Take 25 mg by mouth daily as needed.     [provider]  folic acid (FOLVITE) 938 MCG tablet Take 400 mcg by mouth daily.    [provider]  heparin 1000 unit/ml SOLN injection Heparin Sodium (Porcine) 1,000 Units/mL Catheter Lock Arterial 02/18/20 02/16/21  [provider]  insulin aspart (NOVOLOG) 100 UNIT/ML injection USE A MAX OF 65 UNITS DAILY VIA INSULIN PUMP 06/24/20   Elayne Snare, MD  insulin aspart (NOVOLOG) 100 UNIT/ML injection USE A MAX OF 65 UNITS DAILY VIA INSULIN PUMP 11/07/20   Elayne Snare, MD  Insulin Human (INSULIN PUMP) SOLN Inject into the skin. Insulin aspart (Novolog) 100 unit/ml---MAX 65 UNITS DAILY    [provider]  norethindrone (AYGESTIN) 5 MG tablet Take 5 mg by mouth See admin instructions.  02/17/19   [provider]  Omega-3 Fatty Acids (OMEGA 3 500) 500 MG CAPS Take 500 mg by mouth daily.    [provider]  ondansetron (ZOFRAN ODT) 4 MG disintegrating tablet Take 1 tablet (4 mg total) by mouth every 8 (eight) hours as needed for nausea or vomiting. 10/08/20   Luna Fuse, MD  pravastatin (PRAVACHOL) 40 MG tablet Take 1 tablet (40 mg total) by mouth daily. 11/07/20   Elayne Snare, MD  sulfamethoxazole-trimethoprim (BACTRIM DS) 800-160 MG tablet Take 1 tablet by mouth daily.    [provider]    Physical Exam: Vitals:   11/21/20 0720 11/21/20 0730 11/21/20 0803 11/21/20 0830  BP:  (!) 185/85 (!) 161/109 (!) 166/70  Pulse: 89  89 91 91  Resp:  $Remo'15 16  16  'hItwN$ Temp:      TempSrc:      SpO2: 96% 96% 97% 97%  Weight:      Height:        Constitutional: NAD, calm, comfortable Vitals:   11/21/20 0720 11/21/20 0730 11/21/20 0803 11/21/20 0830  BP:  (!) 185/85 (!) 161/109 (!) 166/70  Pulse: 89 89 91 91  Resp:  $Remo'15 16 16  'Kfnrn$ Temp:      TempSrc:      SpO2: 96% 96% 97% 97%  Weight:      Height:       Eyes: PERRL, right orbital edema, patient lying on right side, pupils minimally reactive ENMT: Dry membranes are moist. Posterior poor dentition.  Neck: normal, supple, no masses, no thyromegaly Respiratory: clear to auscultation bilaterally, no wheezing, no crackles. Normal respiratory effort. No accessory muscle use.  Cardiovascular: Regular rate and rhythm, no murmurs / rubs / gallops. No extremity edema.   Abdomen: no tenderness, no masses palpated. No hepatosplenomegaly. Bowel sounds positive.  Musculoskeletal: Withdraws to tactile stimuli, partial amputation right foot transmetatarsal  skin: Eschar medial right ankle, darker discoloration distal digits second foot, thrill left upper extremity medially at fistula neurologic: Sleeping in bed, nonverbal, withdraws to tactile stimuli psychiatric: Sleeping in bed, nonverbal, withdraws to tactile stimuli  Labs on Admission: I have personally reviewed following labs and imaging studies  CBC: Recent Labs  Lab 11/21/20 0550  WBC 10.3  NEUTROABS 8.6*  HGB 11.0*  HCT 32.8*  MCV 89.4  PLT 371    Basic Metabolic Panel: Recent Labs  Lab 11/21/20 0550  NA 124*  K 4.3  CL 84*  CO2 18*  GLUCOSE 775*  BUN 57*  CREATININE 7.19*  CALCIUM 8.6*    GFR: Estimated Creatinine Clearance: 9.1 mL/min (A) (by C-G formula based on SCr of 7.19 mg/dL (H)).  Liver Function Tests: Recent Labs  Lab 11/21/20 0550  AST 28  ALT 45*  ALKPHOS 242*  BILITOT 0.7  PROT 8.1  ALBUMIN 3.9    Urine analysis:    Component Value Date/Time   COLORURINE YELLOW 10/08/2020 0500   APPEARANCEUR CLEAR  10/08/2020 0500   LABSPEC 1.009 10/08/2020 0500   PHURINE 9.0 (H) 10/08/2020 0500   GLUCOSEU >=500 (A) 10/08/2020 0500   GLUCOSEU 500 (A) 01/26/2015 1200   HGBUR NEGATIVE 10/08/2020 0500   BILIRUBINUR NEGATIVE 10/08/2020 0500   KETONESUR NEGATIVE 10/08/2020 0500   PROTEINUR 100 (A) 10/08/2020 0500   UROBILINOGEN 0.2 01/26/2015 1200   NITRITE NEGATIVE 10/08/2020 0500   LEUKOCYTESUR NEGATIVE 10/08/2020 0500    Radiological Exams on Admission: DG Chest Port 1 View  Result Date: 11/21/2020 CLINICAL DATA:  50 year old female with possible sepsis. EXAM: PORTABLE CHEST 1 VIEW COMPARISON:  Chest x-ray 08/06/2018. FINDINGS: Lung volumes are normal. No consolidative airspace disease. No pleural effusions. No pneumothorax. No definite suspicious appearing pulmonary nodules or masses are noted. Cephalization of the pulmonary vasculature, without frank pulmonary edema. Heart size is mildly enlarged. Upper mediastinal contours are within normal limits. Vascular stent projecting over the left axillary region. IMPRESSION: 1. Cardiomegaly with pulmonary venous congestion, but no frank pulmonary edema. Electronically Signed   By: Vinnie Langton M.D.   On: 11/21/2020 06:20   DG Foot Complete Left  Result Date: 11/21/2020 CLINICAL DATA:  Chronic infection. EXAM: LEFT FOOT - COMPLETE 3+ VIEW COMPARISON:  01/09/2019 FINDINGS: Generalized osteopenia. Status post fifth ray resection and partial amputation of the  great toe. There is soft tissue swelling with extensive bony erosion involving the distal second digit. The head of the proximal phalanx is eroded and the other phalanges are barely visible. No opaque foreign body. Similar mildly swollen appearance of the great toe stump. 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 Electronically Signed   By: Monte Fantasia M.D.   On: 11/21/2020 06:21     Assessment/Plan Principal Problem:   DKA, type 1 ([place]) Active Problems:   Hypertension    Hypercholesteremia   Osteomyelitis (HCC)   Sepsis ([place])   ESRD (end stage renal disease) on dialysis ([place])   Acute metabolic encephalopathy   Hypertensive urgency 50 year old female presents with acute encephalopathy secondary to DKA along with end-stage renal disease, unknown last dialysis. DKA-continue insulin drip, follow blood sugar checks Every 4 hours BMPs Critical care consult  Acute encephalopathy-secondary to end-stage renal disease and DKA Every 4 neurochecks Follow-up ins and outs  Hypertensive urgency-we will give as needed metoprolol  Osteomyelitis-continue vancomycin and cefepime ID consult Podiatry consult-Per [name] patient has declined amputation in the past.  Sepsis-secondary to above-follow blood cultures, vanc, cefepime ID consult  End-stage renal disease-nephrology consult Unknown last dialysis  HLD-continue statin once able to take po  Disposition-admit to stepdown head CT-negative Follow troponin-14 Consult to ID for osteo-, nephrology, podiatry-pt known to [facility]-seen by [name] in the past-spoke w/ [name] over the phone   DVT prophylaxis: scd Code Status:   full Family Communication:   Na-noone at bedside Disposition Plan:   Patient is from:  home  Anticipated DC to:  home  Anticipated DC date:  tbd  Anticipated DC barriers: bs  Consults called:  ID-Dr. Evangeline Dakin over the phone Dr. Schertz-nephrology-discussed over the phone; podiatry-[name] Admission status:  Inpt-sdu  Severity of Illness: The appropriate patient status for this patient is INPATIENT. Inpatient status is judged to be reasonable and necessary in order to provide the required intensity of service to ensure the patient's safety. The patient's presenting symptoms, physical exam findings, and initial radiographic and laboratory data in the context of their chronic comorbidities is felt to place them at high risk for further clinical deterioration.  Furthermore, it is not anticipated that the patient will be medically stable for discharge from the hospital within 2 midnights of admission. The following factors support the patient status of inpatient.   " The patient's presenting symptoms include ams, elevated bs. " The worrisome physical exam findings include ams. " The initial radiographic and laboratory data are worrisome because of bs 700. " The chronic co-morbidities include dm.   * I certify that at the point of admission it is my clinical judgment that the patient will require inpatient hospital care spanning beyond 2 midnights from the point of admission due to high intensity of service, high risk for further deterioration and high frequency of surveillance required.Jacqlyn Krauss MD [facility] [phone] How to contact the [facility] Attending or Consulting provider [place] or covering provider during after hours [place], for this patient?   Check the care team in [facility] and look for a) attending/consulting [facility] provider listed and b) the [facility] team listed Log into [email or web address] and use [place]'s universal password to access. If you do not have the password, please contact the hospital operator. Locate the [facility] provider you are looking for under [facility] and page to a number that you can be directly reached. If you still have difficulty  reaching the provider, please page the [facility] (Director on Call) for the Hospitalists listed on amion for assistance.  11/21/2020, 8:53 AM

## 2020-11-21 NOTE — Consult Note (Signed)
[facility]ious Disease    Date of Admission:  11/21/2020     Reason for Consult: dka, severe sepsis    Referring Provider: [facility]    Abx: 8/22-c vanc 8/22-c cefepime        Assessment: 50 yo female with poorly controled dm1, esrd on iHD, hx right foot tma, left foot 5th ray, admitted 8/22 for ams found to have dka in setting left diabetic foot infection/om  Esrd, ihd, LUE fistula Severe sepsis Left foot 2nd toe superficial ulcer; xray erosion in phalanges Aki/lft elevation  Dka/sepsis. While imaging of the foot suggest chronic OM left 2nd toe, unclear source at this time. Given no obvious inflammatory changes around left foot 2nd toe ulcer, Suspect bacteremia (if this is all infectious) driving her presentation  Aki/lft elevation likely sepsis  Ams suspect metabolic encephalopathy from sepsis. Head ct negative    Plan: F/u orthopedics evaluation F/u blood culture Repeat exam once mentation better Continue empiric vanc/cefepime  Diabetics/esrd management per primary team  I spent 60 minute reviewing data/chart, and coordinating care and >50% direct face to face time providing counseling/discussing diagnostics/treatment plan with patient      ------------------------------------------------ Principal Problem:   DKA, type 1 ([place]) Active Problems:   Hypertension   Hypercholesteremia   Osteomyelitis ([place])   Sepsis ([place])   ESRD (end stage renal disease) on dialysis ([place])   Acute metabolic encephalopathy   Hypertensive urgency   DKA (diabetic ketoacidosis) ([place])    HPI: [name] is a 50 y.o. female dm1, admitted for dka/severe sepsis/ams  Hx via chart, discussion with nursing  Patient currently altered  Patient lives with room mate. Nursing spoke with sister who doesn't appear to be familiar with patient. Patient appears different from baseline for 1-2 days in terms of confusion. No other known complaint  Ems was called  for the acute mentation changes.   She is on dialysis but unclear when her last dialysis was  Patietn febrile 101s on presentation; sbp 150s Xray left foot showed 2nd toe phalangeal OM Ct head noncontrast no obvious acute abnormlity Hyponatremia; elevated lactate; normal postassium level Lft mildly elevated Bcx obtained  Vanc/cefepime started Orthopedics called for consultation   She remains altered when I visited  Family History  Problem Relation Age of Onset   Hyperlipidemia Mother    Dementia Mother     Social History   Tobacco Use   Smoking status: Former    Years: 15.00    Types: Cigarettes    Quit date: 05/16/2008    Years since quitting: 12.5   Smokeless tobacco: Never  Vaping Use   Vaping Use: Never used  Substance Use Topics   Alcohol use: Not Currently    Comment: Occasional   Drug use: No    Allergies  Allergen Reactions   Cleocin [Clindamycin Hcl] Diarrhea   Lisinopril Other (See Comments)    Elevated potassium per pt report   Amoxicillin Diarrhea, Nausea Only and Other (See Comments)    Did it involve swelling of the face/tongue/throat, SOB, or low BP? No Did it involve sudden or severe rash/hives, skin peeling, or any reaction on the inside of your mouth or nose? No Did you need to seek medical attention at a hospital or doctor's office? No When did it last happen?      10 + years If all above answers are "NO", may proceed with cephalosporin use.     Bactrim [  Sulfamethoxazole-Trimethoprim] Diarrhea and Nausea Only    Review of Systems: ROS All Other ROS was negative, except mentioned above   Past Medical History:  Diagnosis Date   Anemia    Arthritis    Bronchitis    C. 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 rate 18, height 5\' 7"  (1.702 m), weight 60.8 kg, SpO2 100 %.  Physical Exam General/constitutional: not interactive, spontaneous movement of extremities/tossing-turning in bed; nonverbal HEENT: Normocephalic, PER, Conj Clear Neck supple CV: rrr no mrg Lungs: clear to auscultation, normal respiratory effort Abd: Soft, Nontender Ext: no edema Skin: No Rash Neuro/psych: altered, eyes closed, withdraw to noxious stimuli, moaning MSK: hx right tma; left foot 2nd toe superficial ulceration no purulence/discharge/fluctuance/erythema      Lab Results Lab Results  Component Value Date   WBC 10.3 11/21/2020   HGB 11.0 (L) 11/21/2020   HCT 32.8 (L) 11/21/2020   MCV 89.4 11/21/2020   PLT 246 11/21/2020    Lab Results  Component Value Date   CREATININE 7.19 (H) 11/21/2020   BUN 57 (H) 11/21/2020   NA 124 (L) 11/21/2020   K 4.3 11/21/2020   CL 84 (L) 11/21/2020   CO2 18 (L) 11/21/2020    Lab Results  Component Value Date   ALT 45 (H) 11/21/2020   AST 28 11/21/2020   ALKPHOS 242 (H) 11/21/2020   BILITOT 0.7 11/21/2020       Microbiology: Recent Results (from the past 240 hour(s))  Resp Panel by RT-PCR (Flu A&B, Covid) Nasopharyngeal Swab     Status: None   Collection Time: 11/21/20  8:11 AM   Specimen: Nasopharyngeal Swab; Nasopharyngeal(NP) swabs in vial transport medium  Result Value Ref Range Status   SARS Coronavirus 2 by RT PCR NEGATIVE NEGATIVE Final    Comment: (NOTE) SARS-CoV-2 target nucleic acids are NOT DETECTED.  The SARS-CoV-2 RNA is generally detectable in upper respiratory specimens during the acute phase of infection. The lowest concentration of SARS-CoV-2 viral copies this assay can detect is 138 copies/mL. A negative result does not preclude SARS-Cov-2 infection and should not be used as the sole basis for treatment or other patient  management decisions. A negative result may occur with  improper specimen collection/handling, submission of specimen other than nasopharyngeal swab, presence of viral mutation(s) within the areas targeted by this assay, and inadequate number of viral copies(<138 copies/mL). A negative result must be combined with clinical observations, patient history, and epidemiological information. The expected result is Negative.  Fact Sheet for Patients:  EntrepreneurPulse.com.au  Fact Sheet for Healthcare Providers:  IncredibleEmployment.be  This test is no t yet approved or cleared by the [place] FDA and  has been authorized for detection and/or diagnosis of SARS-CoV-2 by FDA under an Emergency Use Authorization (EUA). This EUA will remain  in effect (meaning this test can be used) for the duration of the COVID-19 declaration under Section 564(b)(1) of the Act, 21 U.S.C.section 360bbb-3(b)(1), unless the authorization is terminated  or revoked sooner.       Influenza A by PCR NEGATIVE NEGATIVE Final   Influenza B by PCR NEGATIVE NEGATIVE Final    Comment: (NOTE) The Xpert Xpress SARS-CoV-2/FLU/RSV plus assay is  intended as an aid in the diagnosis of influenza from Nasopharyngeal swab specimens and should not be used as a sole basis for treatment. Nasal washings and aspirates are unacceptable for Xpert Xpress SARS-CoV-2/FLU/RSV testing.  Fact Sheet for Patients: EntrepreneurPulse.com.au  Fact Sheet for Healthcare Providers: IncredibleEmployment.be  This test is not yet approved or cleared by the [place] FDA and has been authorized for detection and/or diagnosis of SARS-CoV-2 by FDA under an Emergency Use Authorization (EUA). This EUA will remain in effect (meaning this test can be used) for the duration of the COVID-19 declaration under Section 564(b)(1) of the Act, 21 U.S.C. section 360bbb-3(b)(1), unless the authorization is terminated or revoked.  Performed at [facility], [place] [address] [place], [place] [address]      Serology:    Imaging: If present, new imagings (plain films, ct scans, and mri) have been personally visualized and interpreted; radiology reports have been reviewed. Decision making incorporated into the Impression / Recommendations.  8/22 head ct 1. Motion artifact limits evaluation of the brain at the vertex. No definite acute intracranial abnormality. 2. Age advanced mild generalized cerebral atrophy.    8/22 xray left foot Osteomyelitis findings at the second phalanges.  8/22 cxr Cardiomegaly/pulm vasc congestion  Jabier Mutton, [facility] for Infectious Disease [place] [phone] pager    11/21/2020, 11:40 AM

## 2020-11-21 NOTE — ED Notes (Signed)
Per pt's sister pt has dialysis Tuesday, Thursday and Saturday.

## 2020-11-22 ENCOUNTER — Inpatient Hospital Stay (HOSPITAL_COMMUNITY): Payer: Medicare HMO

## 2020-11-22 DIAGNOSIS — N186 End stage renal disease: Secondary | ICD-10-CM

## 2020-11-22 DIAGNOSIS — Z992 Dependence on renal dialysis: Secondary | ICD-10-CM

## 2020-11-22 DIAGNOSIS — M869 Osteomyelitis, unspecified: Secondary | ICD-10-CM

## 2020-11-22 DIAGNOSIS — A419 Sepsis, unspecified organism: Secondary | ICD-10-CM

## 2020-11-22 DIAGNOSIS — R6520 Severe sepsis without septic shock: Secondary | ICD-10-CM

## 2020-11-22 DIAGNOSIS — E1010 Type 1 diabetes mellitus with ketoacidosis without coma: Secondary | ICD-10-CM

## 2020-11-22 LAB — CBC
HCT: 28.1 % — ABNORMAL LOW (ref 36.0–46.0)
HCT: 29.6 % — ABNORMAL LOW (ref 36.0–46.0)
Hemoglobin: 9.5 g/dL — ABNORMAL LOW (ref 12.0–15.0)
Hemoglobin: 10.1 g/dL — ABNORMAL LOW (ref 12.0–15.0)
MCH: 30 pg (ref 26.0–34.0)
MCH: 29.6 pg (ref 26.0–34.0)
MCHC: 33.8 g/dL (ref 30.0–36.0)
MCHC: 34.1 g/dL (ref 30.0–36.0)
MCV: 88.6 fL (ref 80.0–100.0)
MCV: 86.8 fL (ref 80.0–100.0)
Platelets: 215 10*3/uL (ref 150–400)
Platelets: 208 10*3/uL (ref 150–400)
RBC: 3.17 MIL/uL — ABNORMAL LOW (ref 3.87–5.11)
RBC: 3.41 MIL/uL — ABNORMAL LOW (ref 3.87–5.11)
RDW: 13.9 % (ref 11.5–15.5)
RDW: 13.8 % (ref 11.5–15.5)
WBC: 8.9 10*3/uL (ref 4.0–10.5)
WBC: 6.4 10*3/uL (ref 4.0–10.5)
nRBC: 0 % (ref 0.0–0.2)
nRBC: 0 % (ref 0.0–0.2)

## 2020-11-22 LAB — GLUCOSE, CAPILLARY
Glucose-Capillary: 589 mg/dL (ref 70–99)
Glucose-Capillary: 584 mg/dL (ref 70–99)
Glucose-Capillary: >600 mg/dL (ref 70–99)
Glucose-Capillary: 523 mg/dL (ref 70–99)
Glucose-Capillary: 567 mg/dL (ref 70–99)
Glucose-Capillary: 363 mg/dL — ABNORMAL HIGH (ref 70–99)
Glucose-Capillary: 409 mg/dL — ABNORMAL HIGH (ref 70–99)
Glucose-Capillary: 303 mg/dL — ABNORMAL HIGH (ref 70–99)
Glucose-Capillary: 199 mg/dL — ABNORMAL HIGH (ref 70–99)
Glucose-Capillary: 134 mg/dL — ABNORMAL HIGH (ref 70–99)
Glucose-Capillary: 94 mg/dL (ref 70–99)
Glucose-Capillary: 71 mg/dL (ref 70–99)
Glucose-Capillary: 144 mg/dL — ABNORMAL HIGH (ref 70–99)
Glucose-Capillary: 162 mg/dL — ABNORMAL HIGH (ref 70–99)
Glucose-Capillary: 174 mg/dL — ABNORMAL HIGH (ref 70–99)
Glucose-Capillary: 165 mg/dL — ABNORMAL HIGH (ref 70–99)
Glucose-Capillary: 132 mg/dL — ABNORMAL HIGH (ref 70–99)
Glucose-Capillary: 147 mg/dL — ABNORMAL HIGH (ref 70–99)
Glucose-Capillary: 179 mg/dL — ABNORMAL HIGH (ref 70–99)
Glucose-Capillary: 172 mg/dL — ABNORMAL HIGH (ref 70–99)
Glucose-Capillary: 130 mg/dL — ABNORMAL HIGH (ref 70–99)
Glucose-Capillary: 112 mg/dL — ABNORMAL HIGH (ref 70–99)
Glucose-Capillary: 112 mg/dL — ABNORMAL HIGH (ref 70–99)

## 2020-11-22 LAB — MRSA NEXT GEN BY PCR, NASAL: MRSA by PCR Next Gen: DETECTED — AB

## 2020-11-22 LAB — BASIC METABOLIC PANEL
Anion gap: 13 (ref 5–15)
Anion gap: 18 — ABNORMAL HIGH (ref 5–15)
Anion gap: 20 — ABNORMAL HIGH (ref 5–15)
Anion gap: 20 — ABNORMAL HIGH (ref 5–15)
Anion gap: 21 — ABNORMAL HIGH (ref 5–15)
Anion gap: 23 — ABNORMAL HIGH (ref 5–15)
BUN: 61 mg/dL — ABNORMAL HIGH (ref 6–20)
BUN: 69 mg/dL — ABNORMAL HIGH (ref 6–20)
BUN: 69 mg/dL — ABNORMAL HIGH (ref 6–20)
BUN: 74 mg/dL — ABNORMAL HIGH (ref 6–20)
BUN: 76 mg/dL — ABNORMAL HIGH (ref 6–20)
BUN: 78 mg/dL — ABNORMAL HIGH (ref 6–20)
CO2: 14 mmol/L — ABNORMAL LOW (ref 22–32)
CO2: 15 mmol/L — ABNORMAL LOW (ref 22–32)
CO2: 16 mmol/L — ABNORMAL LOW (ref 22–32)
CO2: 16 mmol/L — ABNORMAL LOW (ref 22–32)
CO2: 18 mmol/L — ABNORMAL LOW (ref 22–32)
CO2: 22 mmol/L (ref 22–32)
Calcium: 8.1 mg/dL — ABNORMAL LOW (ref 8.9–10.3)
Calcium: 8.2 mg/dL — ABNORMAL LOW (ref 8.9–10.3)
Calcium: 8.3 mg/dL — ABNORMAL LOW (ref 8.9–10.3)
Calcium: 8.3 mg/dL — ABNORMAL LOW (ref 8.9–10.3)
Calcium: 8.4 mg/dL — ABNORMAL LOW (ref 8.9–10.3)
Calcium: 8.5 mg/dL — ABNORMAL LOW (ref 8.9–10.3)
Chloride: 89 mmol/L — ABNORMAL LOW (ref 98–111)
Chloride: 92 mmol/L — ABNORMAL LOW (ref 98–111)
Chloride: 93 mmol/L — ABNORMAL LOW (ref 98–111)
Chloride: 94 mmol/L — ABNORMAL LOW (ref 98–111)
Chloride: 94 mmol/L — ABNORMAL LOW (ref 98–111)
Chloride: 94 mmol/L — ABNORMAL LOW (ref 98–111)
Creatinine, Ser: 8.24 mg/dL — ABNORMAL HIGH (ref 0.44–1.00)
Creatinine, Ser: 9.1 mg/dL — ABNORMAL HIGH (ref 0.44–1.00)
Creatinine, Ser: 9.29 mg/dL — ABNORMAL HIGH (ref 0.44–1.00)
Creatinine, Ser: 9.31 mg/dL — ABNORMAL HIGH (ref 0.44–1.00)
Creatinine, Ser: 9.69 mg/dL — ABNORMAL HIGH (ref 0.44–1.00)
Creatinine, Ser: 9.81 mg/dL — ABNORMAL HIGH (ref 0.44–1.00)
GFR, Estimated: 4 mL/min — ABNORMAL LOW (ref >60)
GFR, Estimated: 5 mL/min — ABNORMAL LOW (ref >60)
GFR, Estimated: 5 mL/min — ABNORMAL LOW (ref >60)
GFR, Estimated: 5 mL/min — ABNORMAL LOW (ref >60)
GFR, Estimated: 5 mL/min — ABNORMAL LOW (ref >60)
GFR, Estimated: 5 mL/min — ABNORMAL LOW (ref >60)
Glucose, Bld: 112 mg/dL — ABNORMAL HIGH (ref 70–99)
Glucose, Bld: 114 mg/dL — ABNORMAL HIGH (ref 70–99)
Glucose, Bld: 161 mg/dL — ABNORMAL HIGH (ref 70–99)
Glucose, Bld: 176 mg/dL — ABNORMAL HIGH (ref 70–99)
Glucose, Bld: 410 mg/dL — ABNORMAL HIGH (ref 70–99)
Glucose, Bld: 421 mg/dL — ABNORMAL HIGH (ref 70–99)
Potassium: 4.8 mmol/L (ref 3.5–5.1)
Potassium: 5.1 mmol/L (ref 3.5–5.1)
Potassium: 5.1 mmol/L (ref 3.5–5.1)
Potassium: 5.1 mmol/L (ref 3.5–5.1)
Potassium: 5.4 mmol/L — ABNORMAL HIGH (ref 3.5–5.1)
Potassium: 5.9 mmol/L — ABNORMAL HIGH (ref 3.5–5.1)
Sodium: 125 mmol/L — ABNORMAL LOW (ref 135–145)
Sodium: 128 mmol/L — ABNORMAL LOW (ref 135–145)
Sodium: 129 mmol/L — ABNORMAL LOW (ref 135–145)
Sodium: 129 mmol/L — ABNORMAL LOW (ref 135–145)
Sodium: 130 mmol/L — ABNORMAL LOW (ref 135–145)
Sodium: 131 mmol/L — ABNORMAL LOW (ref 135–145)

## 2020-11-22 LAB — BETA-HYDROXYBUTYRIC ACID
Beta-Hydroxybutyric Acid: 4.59 mmol/L — ABNORMAL HIGH (ref 0.05–0.27)
Beta-Hydroxybutyric Acid: 2.94 mmol/L — ABNORMAL HIGH (ref 0.05–0.27)

## 2020-11-22 MED ORDER — DEXTROSE 50 % IV SOLN
0.0000 mL | INTRAVENOUS | Status: DC | PRN
  Administered 2020-11-28 – 2020-12-01 (×2): 50 mL via INTRAVENOUS
  Filled 2020-11-22 (×2): qty 50

## 2020-11-22 MED ORDER — SODIUM CHLORIDE 0.9 % IV SOLN
2.0000 g | INTRAVENOUS | Status: DC
  Administered 2020-11-22: 2 g via INTRAVENOUS
  Filled 2020-11-22: qty 2

## 2020-11-22 MED ORDER — VANCOMYCIN HCL 750 MG/150ML IV SOLN
750.0000 mg | INTRAVENOUS | Status: DC
  Administered 2020-11-29 – 2020-12-01 (×2): 750 mg via INTRAVENOUS
  Filled 2020-11-22 (×7): qty 150

## 2020-11-22 MED ORDER — DEXTROSE 5 % IV SOLN: INTRAVENOUS | Status: DC

## 2020-11-22 MED ORDER — SODIUM ZIRCONIUM CYCLOSILICATE 10 G PO PACK
10.0000 g | PACK | Freq: Once | ORAL | Status: DC
  Filled 2020-11-22: qty 1

## 2020-11-22 MED ORDER — HALOPERIDOL LACTATE 5 MG/ML IJ SOLN
4.0000 mg | Freq: Once | INTRAMUSCULAR | Status: AC
  Administered 2020-11-22: 4 mg via INTRAVENOUS
  Filled 2020-11-22: qty 1

## 2020-11-22 MED ORDER — HALOPERIDOL LACTATE 5 MG/ML IJ SOLN
2.0000 mg | Freq: Four times a day (QID) | INTRAMUSCULAR | Status: DC | PRN
Start: 2020-11-22 — End: 2020-11-22
  Filled 2020-11-22: qty 1

## 2020-11-22 MED ORDER — VANCOMYCIN VARIABLE DOSE PER UNSTABLE RENAL FUNCTION (PHARMACIST DOSING): Status: DC

## 2020-11-22 MED ORDER — INSULIN REGULAR(HUMAN) IN NACL 100-0.9 UT/100ML-% IV SOLN
INTRAVENOUS | Status: DC
  Administered 2020-11-22: 8 [IU]/h via INTRAVENOUS
  Filled 2020-11-22: qty 100

## 2020-11-22 MED ORDER — HALOPERIDOL LACTATE 5 MG/ML IJ SOLN
2.0000 mg | Freq: Four times a day (QID) | INTRAMUSCULAR | Status: DC | PRN
  Administered 2020-11-22 – 2020-11-25 (×8): 2 mg via INTRAMUSCULAR
  Filled 2020-11-22 (×9): qty 1

## 2020-11-22 NOTE — Progress Notes (Signed)
7a-&p Pt received this am lethargic and woke up a couple hours later with agitation, thrashing, and kicking in bed. Took 3 staff to change patient, having several diarrhea stools today. Side rails to bed were padded for pt's safety. Pt received Haldol 2mg  IM this pm which calmed her some but staff was still not able to get her to get telemetry wires on. Pt's sister [name] visit this pm & was given update on pt.

## 2020-11-22 NOTE — Progress Notes (Signed)
   11/22/20 0332  Assess: MEWS Score  Temp (!) 100.5 F (38.1 C)  BP (!) 163/56  Pulse Rate (!) 102  ECG Heart Rate (!) 102  Resp 14  Level of Consciousness Responds to Voice  SpO2 95 %  O2 Device Room Air  Assess: MEWS Score  MEWS Temp 1  MEWS Systolic 0  MEWS Pulse 1  MEWS RR 0  MEWS LOC 1  MEWS Score 3  MEWS Score Color Yellow  Assess: if the MEWS score is Yellow or Red  Were vital signs taken at a resting state? Yes  Focused Assessment No change from prior assessment  Early Detection of Sepsis Score *See Row Information* Low  MEWS guidelines implemented *See Row Information* Yes  Treat  MEWS Interventions Administered scheduled meds/treatments  Pain Scale 0-10  Pain Score 0  Take Vital Signs  Increase Vital Sign Frequency  Yellow: Q 2hr X 2 then Q 4hr X 2, if remains yellow, continue Q 4hrs  Escalate  MEWS: Escalate Yellow: discuss with charge nurse/RN and consider discussing with provider and RRT  Notify: Charge Nurse/RN  Name of Charge Nurse/RN Notified Chris, RN  Date Charge Nurse/RN Notified 11/22/20  Time Charge Nurse/RN Notified 0335  Notify: Provider  Provider Name/Title Dr. Olevia Bowens  Date Provider Notified 11/22/20  Time Provider Notified [phone]  Notification Type Page  Notification Reason Other (Comment) (see vitals)  Provider response No new orders  Date of Provider Response 11/22/20  Time of Provider Response [phone]  Document  Patient Outcome Stabilized after interventions  Progress note created (see row info) Yes

## 2020-11-22 NOTE — Progress Notes (Signed)
[name] for [facility]ious Disease  Date of Admission:  11/21/2020           Reason for visit: Follow up on osteomyelitis  Current antibiotics: Vancomycin 8/22--pres Cefepime 8/22--pres   ASSESSMENT:    50 year old woman admitted with:  Left foot osteomyelitis of the second phalanges Severe sepsis secondary to #1 ESRD on intermittent HD Type 1 diabetes presenting with DKA Previous right TMA  PLAN:    Continue current antibiotics with cefepime and vancomycin Consult orthopedic surgery Follow-up cultures Wound care, glycemic control HD per nephrology Will follow   Principal Problem:   DKA, type 1 ([place]) Active Problems:   Hypertension   Hypercholesteremia   Osteomyelitis ([place])   Severe sepsis ([place])   ESRD (end stage renal disease) on dialysis ([place])   Acute metabolic encephalopathy   Hypertensive urgency   DKA (diabetic ketoacidosis) ([place])    MEDICATIONS:    Scheduled Meds:  Chlorhexidine Gluconate Cloth  6 each Topical Q0600   collagenase   Topical Daily   sodium zirconium cyclosilicate  10 g Oral Once   vancomycin variable dose per unstable renal function (pharmacist dosing)   Does not apply See admin instructions   Continuous Infusions:  ceFEPime (MAXIPIME) IV     dextrose 5% lactated ringers 30 mL/hr at 11/21/20 2104   insulin 7 Units/hr (11/22/20 0718)   PRN Meds:.dextrose, dextrose, ondansetron (ZOFRAN) IV  SUBJECTIVE:   24 hour events:  Overnight events noted with agitation and placing patient in soft restraints and given IV Haldol. Febrile T-max 100.5 WBC 8.9 Bicarb 16, anion gap 20 Blood cultures no growth Transferred to [facility]  Patient is currently somnolent after receiving sedating medications overnight.  Unable to contribute further information.  Review of Systems  Unable to perform ROS: Mental status change     OBJECTIVE:   Blood pressure 139/71, pulse 97, temperature 98.9 F (37.2 C), temperature source  Oral, resp. rate 17, height 5\' 7"  (1.702 m), weight 60.8 kg, SpO2 95 %. Body mass index is 20.99 kg/m.  Physical Exam Constitutional:      Comments: Somnolent, not arousable to voice.  HENT:     Head: Normocephalic and atraumatic.  Cardiovascular:     Rate and Rhythm: Normal rate and regular rhythm.  Pulmonary:     Effort: Pulmonary effort is normal. No respiratory distress.  Abdominal:     General: There is no distension.     Palpations: Abdomen is soft.     Tenderness: There is no abdominal tenderness.  Musculoskeletal:     Cervical back: Normal range of motion and neck supple.     Right lower leg: No edema.     Left lower leg: No edema.     Comments: Status post right TMA. Left toe ulcers bandaged.  See pictures below.  Skin:    General: Skin is warm and dry.     Findings: No rash.  Neurological:     General: No focal deficit present.      Lab Results: Lab Results  Component Value Date   WBC 8.9 11/22/2020   HGB 9.5 (L) 11/22/2020   HCT 28.1 (L) 11/22/2020   MCV 88.6 11/22/2020   PLT 215 11/22/2020    Lab Results  Component Value Date   NA 128 (L) 11/22/2020   K 4.8 11/22/2020   CO2 16 (L) 11/22/2020   GLUCOSE 410 (H) 11/22/2020   BUN 69 (H) 11/22/2020   CREATININE 9.31 (H) 11/22/2020  CALCIUM 8.5 (L) 11/22/2020   GFRNONAA 5 (L) 11/22/2020   GFRAA 16 (L) 08/08/2018    Lab Results  Component Value Date   ALT 45 (H) 11/21/2020   AST 28 11/21/2020   ALKPHOS 242 (H) 11/21/2020   BILITOT 0.7 11/21/2020       Component Value Date/Time   CRP 9.9 (H) 11/10/2018 1126   CRP 17.3 (H) 12/18/2016 1544       Component Value Date/Time   ESRSEDRATE 56 (H) 11/10/2018 1126     I have reviewed the micro and lab results in Epic.  Imaging: CT HEAD WO CONTRAST (5MM)  Result Date: 11/21/2020 CLINICAL DATA:  Altered mental status. EXAM: CT HEAD WITHOUT CONTRAST TECHNIQUE: Contiguous axial images were obtained from the base of the skull through the vertex  without intravenous contrast. COMPARISON:  None. FINDINGS: Motion artifact limits evaluation of the brain at the vertex. Brain: No evidence of acute infarction, hemorrhage, hydrocephalus, extra-axial collection or mass lesion/mass effect. Age advanced mild generalized cerebral atrophy. Vascular: Calcified atherosclerosis at the skullbase. No hyperdense vessel. Skull: Normal. Negative for fracture or focal lesion. Sinuses/Orbits: Right greater than left maxillary sinus and ethmoid air cell mucosal thickening. No air-fluid levels. The mastoid air cells are clear. The orbits are unremarkable. Other: None. IMPRESSION: 1. Motion artifact limits evaluation of the brain at the vertex. No definite acute intracranial abnormality. 2. Age advanced mild generalized cerebral atrophy. Electronically Signed   By: Titus Dubin M.D.   On: 11/21/2020 11:14   DG Chest Port 1 View  Result Date: 11/21/2020 CLINICAL DATA:  50 year old female with possible sepsis. EXAM: PORTABLE CHEST 1 VIEW COMPARISON:  Chest x-ray 08/06/2018. FINDINGS: Lung volumes are normal. No consolidative airspace disease. No pleural effusions. No pneumothorax. No definite suspicious appearing pulmonary nodules or masses are noted. Cephalization of the pulmonary vasculature, without frank pulmonary edema. Heart size is mildly enlarged. Upper mediastinal contours are within normal limits. Vascular stent projecting over the left axillary region. IMPRESSION: 1. Cardiomegaly with pulmonary venous congestion, but no frank pulmonary edema. Electronically Signed   By: Vinnie Langton M.D.   On: 11/21/2020 06:20   DG Foot Complete Left  Result Date: 11/21/2020 CLINICAL DATA:  Chronic infection. EXAM: LEFT FOOT - COMPLETE 3+ VIEW COMPARISON:  01/09/2019 FINDINGS: Generalized osteopenia. 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 There is soft tissue swelling with extensive bony erosion involving the distal second digit. The head of  the proximal phalanx is eroded and the other phalanges are barely visible. No opaque foreign body. Similar mildly swollen appearance of the great toe stump. 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 Electronically Signed   By: Monte Fantasia M.D.   On: 11/21/2020 06:21     Imaging independently reviewed in Epic.    [facility]ious [facility] [phone] pager 11/22/2020, 10:34 AM  I spent greater than 35 minutes with the patient including greater than 50% of time in face to face counsel of the patient and in coordination of their care.

## 2020-11-22 NOTE — Consult Note (Signed)
PODIATRY CONSULTATION  NAME [name] MRN [id number] DOB 15-Jan-1971 DOA 11/21/2020   Reason for consult:  Chief Complaint  Patient presents with   Hyperglycemia    Consulting physician: Oren Binet MD  History of present illness: 50 y.o. female uncontrolled diabetes mellitus type 1, ESRD admitted for AMS and DKA.  Patient presented to the ED agitated and uncooperative.  Patient is well-known to the podiatry practice and my care.  She has had multiple previous amputations and wound care to the bilateral feet.  She has longstanding chronic osteomyelitis however it has been stable for several months.  Podiatry consulted upon admission.  Patient is sleeping and unresponsive during evaluation.  Patient's sister, [name] present during evaluation  Past Medical History:  Diagnosis Date   Anemia    Arthritis    Bronchitis    C. 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[facility])    Peripheral vascular disease (HCC)    Sleep apnea    does not use cpap   Staphylococcus aureus bacteremia 10/2014   Type 1 diabetes ([place])    onset age 74    CBC Latest Ref Rng & Units 11/22/2020 11/22/2020 11/21/2020  WBC 4.0 - 10.5 K/uL 6.4 8.9 7.8  Hemoglobin 12.0 - 15.0 g/dL 10.1(L) 9.5(L) 10.2(L)  Hematocrit 36.0 - 46.0 % 29.6(L) 28.1(L) 30.6(L)  Platelets 150 - 400 K/uL 208 215 226    BMP Latest Ref Rng & Units 11/22/2020 11/22/2020 11/22/2020  Glucose 70 - 99 mg/dL 176(H) 161(H) 114(H)   BUN 6 - 20 mg/dL 76(H) 74(H) 69(H)  Creatinine 0.44 - 1.00 mg/dL 9.69(H) 9.29(H) 9.10(H)  BUN/Creat Ratio 6 - 22 (calc) - - -  Sodium 135 - 145 mmol/L 129(L) 131(L) 129(L)  Potassium 3.5 - 5.1 mmol/L 5.1 5.4(H) 5.1  Chloride 98 - 111 mmol/L 94(L) 93(L) 94(L)  CO2 22 - 32 mmol/L 14(L) 15(L) 22  Calcium 8.9 - 10.3 mg/dL 8.3(L) 8.1(L) 8.2(L)       Physical Exam: General: The patient is alert and oriented x3 in no acute distress.   Dermatology: Skin is warm, dry and supple bilateral lower extremities.  The open wounds to the right foot amputation stump and the left second toe appear stable.  No drainage noted.  No malodor noted.  No erythema or edema around the area.  No concern at the moment for acute infection.  Vascular: Negative for any significant edema or erythema around the feet.  Skin is warm to touch.  Neurological: Known history of peripheral polyneuropathy secondary to diabetes mellitus type 1; uncontrolled  Musculoskeletal Exam: History of prior amputations bilateral feet and previously diagnosed osteomyelitis.    ASSESSMENT/PLAN OF CARE 1.  History of prior amputations with open wounds bilateral feet -Currently upon evaluation there does not appear to be any acute infection to the bilateral feet.  They  appear warm and stable.  No concern for acute on chronic osteomyelitis at the moment. -The nurse, present during evaluation stated that the patient kicks the dressings off of the feet.  The wounds are stable and dry.  Recommend simply putting hospital socks on the patient's feet -Recommend conservative management at the moment and simple observation.  No current concern for acute inflammatory infection -Podiatry will follow up with the patient when she is stabilized and AAOx3.  In the meantime continue conservative care     Thank you for the consult.  Please contact me directly with any questions or concerns.  Cell [phone]   Edrick Kins, DPM [facility]  Dr. Edrick Kins, DPM    [address], [place] [address]                Office [phone]  Fax [phone]

## 2020-11-22 NOTE — Progress Notes (Signed)
Arrived to patient's room to place PIV. Nurse at bedside. Patient flailing all over the bed. At this time unable to place line. Notified nurses at bedside to reconsult VAST when patient calmer. VU. Fran Lowes, RN VAST

## 2020-11-22 NOTE — Progress Notes (Addendum)
02:15- Notified Dr. Olevia Bowens of AM labs:  K:5.9 Na:125 Beta-Hydroxybutyric Acid: 4.59  0250- Orders for stat CBG and to restart  insulin gtt. CBG: 589.   03:30- Patient became yellow mews. Notified Dr. Olevia Bowens of low grade fever. Upon writers assessment, patient is able to give full name and birthday, disoriented to place/time/situation. Able to answer simple questions. Remains very drowsy/lethargic- able to stay awake for less than 10 seconds at a time. This is no major change from initial shift assessment   03:56- Insulin gtt started   04:15- Dr. Olevia Bowens at bedside to assess patient. Patient is able to answer simple yes/no questions. Assessed  of facial and peri-orbital swelling. Orders placed for IV abx  0500- Patient becoming extremely agitated, thrashing around in bed, yelling out, and pulling at her down and IV. Marland Kitchen Notified Dr. Olevia Bowens as patient is at risk for losing IV access infusing insulin gtt. Orders received for restraints and one time dose of haldol and bilateral wrist restraints.

## 2020-11-22 NOTE — Progress Notes (Signed)
[facility] Progress Note  Subjective: pt seen in room, remains poorly responsive  Vitals:   11/22/20 0815 11/22/20 1000 11/22/20 1039 11/22/20 1153  BP: 133/60 139/71 (!) 142/72 (!) 142/83  Pulse: 88 97 86 84  Resp: 17 17  14   Temp:  98.9 F (37.2 C) 99.5 F (37.5 C) 98.7 F (37.1 C)  TempSrc:  Oral Oral Axillary  SpO2: 94% 95% 97% 93%  Weight:      Height:        Exam:  Lethargic, not responding to voice, nad   no jvd  Chest cta bilat  Cor reg no RG  Abd soft ntnd no ascites   Ext no LE edema, mild facial edema   Alert, NF, ox3   LUA AVF +bruit     Home meds: asa, phoslo 2 ac , insulin pump, pravachol, prn's    OP HD: AF TTS   4h  56kg  2/2 bath Hep none LUA AVF   - hect 9 ug IV tiw     Assessment/ Plan: DKA / type 1 DM - per pmd, BS's down under 200 today. Please do not use LR or NS fluids without renal service approval.  OK to use straight D5W or D10W for BS support as needed.  Hyponatremia/ hyperkalemia - mild, will have HD today AMS - due to #1 and /or infection L foot osteomyelitis - on IV vanc/ cefepime ESRD - on HD TTS.  HD today upstairs.  BP/ volume - BP's wnl, 4kg up, UF 3-4 L as tol.  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   Inpatient medications:  Chlorhexidine Gluconate Cloth  6 each Topical Q0600   collagenase   Topical Daily   sodium zirconium cyclosilicate  10 g Oral Once   vancomycin variable dose per unstable renal function (pharmacist dosing)   Does not apply See admin instructions    ceFEPime (MAXIPIME) IV     dextrose     insulin Stopped (11/22/20 1118)   dextrose, dextrose,  ondansetron (ZOFRAN) IV

## 2020-11-22 NOTE — Progress Notes (Signed)
PROGRESS NOTE        PATIENT DETAILS Name: [name] Age: 50 y.o. Sex: female Date of Birth: Aug 21, 1970 Admit Date: 11/21/2020 Admitting Physician Jacqlyn Krauss, MD [id number], Babs Bertin, PA-C  Brief Narrative: Patient is a 50 y.o. female with history of DM-1, ESRD on HD who presented with acute metabolic encephalopathy, DKA and left diabetic foot with osteomyelitis.  Significant events: 8/22>> presented to [facility] ED with confusion-found to have DKA  Significant studies: 8/22>> x-ray left foot: Osteomyelitis at the second phalange 8/22>> CXR: No pneumonia 8/22>> CT head: No acute intracranial abnormality  Antimicrobial therapy: Vancomycin: 8/22>> Cefepime: 8/22>>  Microbiology data: 8/22>> COVID/influenza PCR: Negative 8/22>> blood culture: Negative so far  Procedures : None  Consults: ID, nephrology  DVT Prophylaxis : SCDs Start: 11/21/20 1037   Subjective: Seen twice this morning-earlier she was barely responsive even to sternal rub-and later this morning-when seen again-she responds to a loud verbal stimuli by saying hello pain.  Moving all 4 extremities.  Will not follow commands.   Assessment/Plan: Acute metabolic encephalopathy: Suspect due to DKA/acute osteomyelitis-suspect that elevated BUN may be playing a role as well. CT head negative-seems to be slowly coming around-more awake compared to this morning.  Hopefully she will improve post HD.  She is easily protecting her airway-continue watch closely-check EEG to ensure no seizures.  If mental status is not improved over the next day or so-we can contemplate further work-up.    DKA history of DM 1 on insulin pump (A1c 8.1 on 8/3): CBGs getting better-continue insulin drip until until she is a bit more awake and alert.  Sepsis due to acute osteomyelitis involving left second phalanx: Sepsis physiology improving-continue empiric antibiotics-Per H&P-patient has refused  amputation in the past-follows with [name].  Once mental status improves-we will need to consult podiatry.  She also appears to have some discharge from the right foot amputation stump-we will obtain right foot x-rays as well.  Facial swelling: Per family (spoke with sister over the phone)-known to get facial swelling from fluid retention-sister thinks her face was swollen when she saw her this past Thursday as well.  I have asked her to come by and see if her swelling is at her baseline.  Hopefully this will improve with HD as well.  ESRD: Nephrology following-we will defer HD to nephrology.  Normocytic anemia: Due to ESRD-defer need for EPO/IV iron to nephrology.  HLD: Resume statin when able  Diet: Diet Order             Diet NPO time specified  Diet effective now                    Code Status: Full code or DNR  Family Communication: Spoke with sister-Alicia OVFIEPPIR-[phone] over phone  Disposition Plan: Status is: Inpatient  Remains inpatient appropriate because:Inpatient level of care appropriate due to severity of illness  Dispo: The patient is from: Home              Anticipated d/c is to: Home              Patient currently is not medically stable to d/c.   Difficult to place patient No   Barriers to Discharge: Continues to be encephalopathic-not yet stable for discharge.  Antimicrobial agents: Anti-infectives (From admission, onward)    Start  Dose/Rate Route Frequency Ordered Stop   11/22/20 1800  ceFEPIme (MAXIPIME) 2 g in sodium chloride 0.9 % 100 mL IVPB  Status:  Discontinued        2 g 200 mL/hr over 30 Minutes Intravenous Every T-Th-Sa (Hemodialysis) 11/21/20 2018 11/22/20 0816   11/22/20 1800  ceFEPIme (MAXIPIME) 2 g in sodium chloride 0.9 % 100 mL IVPB        2 g 200 mL/hr over 30 Minutes Intravenous Every T-Th-Sa (1800) 11/22/20 0816     11/22/20 1000  ceFEPIme (MAXIPIME) 2 g in sodium chloride 0.9 % 100 mL IVPB  Status:   Discontinued       Note to Pharmacy: Pharmacy to dose   2 g 200 mL/hr over 30 Minutes Intravenous Every 24 hours 11/21/20 1035 11/21/20 2017   11/22/20 0435  vancomycin variable dose per unstable renal function (pharmacist dosing)         Does not apply See admin instructions 11/22/20 0435     11/21/20 0700  vancomycin (VANCOCIN) IVPB 1000 mg/200 mL premix        1,000 mg 200 mL/hr over 60 Minutes Intravenous  Once 11/21/20 0648 11/21/20 1247   11/21/20 0700  ceFEPIme (MAXIPIME) 2 g in sodium chloride 0.9 % 100 mL IVPB        2 g 200 mL/hr over 30 Minutes Intravenous  Once 11/21/20 0656 11/21/20 1022   11/21/20 0645  metroNIDAZOLE (FLAGYL) IVPB 500 mg        500 mg 100 mL/hr over 60 Minutes Intravenous  Once 11/21/20 0636 11/21/20 0816        Time spent: 45 minutes-Greater than 50% of this time was spent in counseling, explanation of diagnosis, planning of further management, and coordination of care.  MEDICATIONS: Scheduled Meds:  Chlorhexidine Gluconate Cloth  6 each Topical Q0600   collagenase   Topical Daily   sodium zirconium cyclosilicate  10 g Oral Once   vancomycin variable dose per unstable renal function (pharmacist dosing)   Does not apply See admin instructions   Continuous Infusions:  ceFEPime (MAXIPIME) IV     dextrose 5% lactated ringers 30 mL/hr at 11/21/20 2104   insulin Stopped (11/22/20 1118)   PRN Meds:.dextrose, dextrose, ondansetron (ZOFRAN) IV   PHYSICAL EXAM: Vital signs: Vitals:   11/22/20 0815 11/22/20 1000 11/22/20 1039 11/22/20 1153  BP: 133/60 139/71 (!) 142/72 (!) 142/83  Pulse: 88 97 86 84  Resp: 17 17  14   Temp:  98.9 F (37.2 C) 99.5 F (37.5 C) 98.7 F (37.1 C)  TempSrc:  Oral Oral Axillary  SpO2: 94% 95% 97% 93%  Weight:      Height:       Filed Weights   11/21/20 0600  Weight: 60.8 kg   Body mass index is 20.99 kg/m.   Gen Exam: Seen twice this morning-slightly more alert on second visit.  Responding to vocal stimuli but  confused. HEENT:atraumatic, normocephalic Chest: B/L clear to auscultation anteriorly CVS:S1S2 regular Abdomen:soft non tender, non distended Extremities:no edema Neurology: Difficult exam-on restraints but easily moving all 4 extremities.   Skin: no rash  I have personally reviewed following labs and imaging studies  LABORATORY DATA: CBC: Recent Labs  Lab 11/21/20 0550 11/21/20 1227 11/22/20 0714  WBC 10.3 7.8 8.9  NEUTROABS 8.6* 6.0  --   HGB 11.0* 10.2* 9.5*  HCT 32.8* 30.6* 28.1*  MCV 89.4 88.4 88.6  PLT 246 226 250    Basic Metabolic Panel: Recent Labs  Lab 11/21/20 1533 11/21/20 1924 11/22/20 0016 11/22/20 0714 11/22/20 1051  NA 129* 127* 125* 128* 129*  K 5.1 5.7* 5.9* 4.8 5.1  CL 92* 90* 89* 92* 94*  CO2 22 18* 16* 16* 22  GLUCOSE 67* 240* 421* 410* 114*  BUN 58* 60* 61* 69* 69*  CREATININE 7.56* 7.74* 8.24* 9.31* 9.10*  CALCIUM 8.5* 8.4* 8.4* 8.5* 8.2*    GFR: Estimated Creatinine Clearance: 7.2 mL/min (A) (by C-G formula based on SCr of 9.1 mg/dL (H)).  Liver Function Tests: Recent Labs  Lab 11/21/20 0550  AST 28  ALT 45*  ALKPHOS 242*  BILITOT 0.7  PROT 8.1  ALBUMIN 3.9   Recent Labs  Lab 11/21/20 0550  LIPASE 40   No results for input(s): AMMONIA in the last 168 hours.  Coagulation Profile: Recent Labs  Lab 11/21/20 0550  INR 1.0    Cardiac Enzymes: No results for input(s): CKTOTAL, CKMB, CKMBINDEX, TROPONINI in the last 168 hours.  BNP (last 3 results) No results for input(s): PROBNP in the last 8760 hours.  Lipid Profile: No results for input(s): CHOL, HDL, LDLCALC, TRIG, CHOLHDL, LDLDIRECT in the last 72 hours.  Thyroid Function Tests: No results for input(s): TSH, T4TOTAL, FREET4, T3FREE, THYROIDAB in the last 72 hours.  Anemia Panel: No results for input(s): VITAMINB12, FOLATE, FERRITIN, TIBC, IRON, RETICCTPCT in the last 72 hours.  Urine analysis:    Component Value Date/Time   COLORURINE YELLOW 10/08/2020  0500   APPEARANCEUR CLEAR 10/08/2020 0500   LABSPEC 1.009 10/08/2020 0500   PHURINE 9.0 (H) 10/08/2020 0500   GLUCOSEU >=500 (A) 10/08/2020 0500   GLUCOSEU 500 (A) 01/26/2015 1200   HGBUR NEGATIVE 10/08/2020 0500   BILIRUBINUR NEGATIVE 10/08/2020 0500   KETONESUR NEGATIVE 10/08/2020 0500   PROTEINUR 100 (A) 10/08/2020 0500   UROBILINOGEN 0.2 01/26/2015 1200   NITRITE NEGATIVE 10/08/2020 0500   LEUKOCYTESUR NEGATIVE 10/08/2020 0500    Sepsis Labs: Lactic Acid, Venous    Component Value Date/Time   LATICACIDVEN 1.6 11/21/2020 1227    MICROBIOLOGY: Recent Results (from the past 240 hour(s))  Blood culture (routine single)     Status: None (Preliminary result)   Collection Time: 11/21/20  5:50 AM   Specimen: BLOOD  Result Value Ref Range Status   Specimen Description   Final    BLOOD SITE NOT SPECIFIED Performed at [facility] [address] [place], [place] [address]    Special Requests   Final    BOTTLES DRAWN AEROBIC AND ANAEROBIC Blood Culture adequate volume Performed at [facility] [address], [place], [name] [address]    Culture   Final    NO GROWTH 1 DAY Performed at [facility], [place] [address], [place], [place] [address]    Report Status PENDING  Incomplete  Resp Panel by RT-PCR (Flu A&B, Covid) Nasopharyngeal Swab     Status: None   Collection Time: 11/21/20  8:11 AM   Specimen: Nasopharyngeal Swab; Nasopharyngeal(NP) swabs in vial transport medium  Result Value Ref Range Status   SARS Coronavirus 2 by RT PCR NEGATIVE NEGATIVE Final    Comment: (NOTE) SARS-CoV-2 target nucleic acids are NOT DETECTED.  The SARS-CoV-2 RNA is generally detectable in upper respiratory specimens during the acute phase of infection. The lowest concentration of SARS-CoV-2 viral copies this assay can detect is 138 copies/mL. A negative result does not preclude SARS-Cov-2 infection and should not be used as the sole basis for  treatment or other patient management decisions. A negative result may occur with  improper specimen collection/handling, submission of specimen other than nasopharyngeal swab, presence of viral mutation(s) within the areas targeted by this assay, and inadequate number of viral copies(<138 copies/mL). A negative result must be combined with clinical observations, patient history, and epidemiological information. The expected result is Negative.  Fact Sheet for Patients:  EntrepreneurPulse.com.au  Fact Sheet for Healthcare Providers:  IncredibleEmployment.be  This test is no t yet approved or cleared by the [place] FDA and  has been authorized for detection and/or diagnosis of SARS-CoV-2 by FDA under an Emergency Use Authorization (EUA). This EUA will remain  in effect (meaning this test can be used) for the duration of the COVID-19 declaration under Section 564(b)(1) of the Act, 21 U.S.C.section 360bbb-3(b)(1), unless the authorization is terminated  or revoked sooner.       Influenza A by PCR NEGATIVE NEGATIVE Final   Influenza B by PCR NEGATIVE NEGATIVE Final    Comment: (NOTE) The Xpert Xpress SARS-CoV-2/FLU/RSV plus assay is intended as an aid in the diagnosis of influenza from Nasopharyngeal swab specimens and should not be used as a sole basis for treatment. Nasal washings and aspirates are unacceptable for Xpert Xpress SARS-CoV-2/FLU/RSV testing.  Fact Sheet for Patients: EntrepreneurPulse.com.au  Fact Sheet for Healthcare Providers: IncredibleEmployment.be  This test is not yet approved or cleared by the [place] FDA and has been authorized for detection and/or diagnosis of SARS-CoV-2 by FDA under an Emergency Use Authorization (EUA). This EUA will remain in effect (meaning this test can be used) for the duration of the COVID-19 declaration under Section 564(b)(1) of the Act, 21  U.S.C. section 360bbb-3(b)(1), unless the authorization is terminated or revoked.  Performed at [facility], [place] [address] [place], [place] [address]   Culture, blood (single)     Status: None (Preliminary result)   Collection Time: 11/21/20  3:45 PM   Specimen: BLOOD RIGHT HAND  Result Value Ref Range Status   Specimen Description BLOOD RIGHT HAND  Final   Special Requests IN PEDIATRIC BOTTLE Blood Culture adequate volume  Final   Culture   Final    NO GROWTH < 24 HOURS Performed at [facility], [place] [address], [place],  [address]    Report Status PENDING  Incomplete    RADIOLOGY STUDIES/RESULTS: CT HEAD WO CONTRAST (5MM)  Result Date: 11/21/2020 CLINICAL DATA:  Altered mental status. EXAM: CT HEAD WITHOUT CONTRAST TECHNIQUE: Contiguous axial images were obtained from the base of the skull through the vertex without intravenous contrast. COMPARISON:  None. FINDINGS: Motion artifact limits evaluation of the brain at the vertex. Brain: No evidence of acute infarction, hemorrhage, hydrocephalus, extra-axial collection or mass lesion/mass effect. Age advanced mild generalized cerebral atrophy. Vascular: Calcified atherosclerosis at the skullbase. No hyperdense vessel. Skull: Normal. Negative for fracture or focal lesion. Sinuses/Orbits: Right greater than left maxillary sinus and ethmoid air cell mucosal thickening. No air-fluid levels. The mastoid air cells are clear. The orbits are unremarkable. Other: None. IMPRESSION: 1. Motion artifact limits evaluation of the brain at the vertex. No definite acute intracranial abnormality. 2. Age advanced mild generalized cerebral atrophy. Electronically Signed   By: Titus Dubin M.D.   On: 11/21/2020 11:14   DG Chest Port 1 View  Result Date: 11/21/2020 CLINICAL DATA:  50 year old female with possible sepsis. EXAM: PORTABLE CHEST 1 VIEW COMPARISON:  Chest x-ray 08/06/2018. FINDINGS: Lung volumes are normal. No  consolidative airspace  disease. No pleural effusions. No pneumothorax. No definite suspicious appearing pulmonary nodules or masses are noted. Cephalization of the pulmonary vasculature, without frank pulmonary edema. Heart size is mildly enlarged. Upper mediastinal contours are within normal limits. Vascular stent projecting over the left axillary region. IMPRESSION: 1. Cardiomegaly with pulmonary venous congestion, but no frank pulmonary edema. Electronically Signed   By: Vinnie Langton M.D.   On: 11/21/2020 06:20   DG Foot Complete Left  Result Date: 11/21/2020 CLINICAL DATA:  Chronic infection. EXAM: LEFT FOOT - COMPLETE 3+ VIEW COMPARISON:  01/09/2019 FINDINGS: Generalized osteopenia. 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 There is soft tissue swelling with extensive bony erosion involving the distal second digit. The head of the proximal phalanx is eroded and the other phalanges are barely visible. No opaque foreign body. Similar mildly swollen appearance of the great toe stump. 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 Electronically Signed   By: Monte Fantasia M.D.   On: 11/21/2020 06:21     LOS: 1 day   Oren Binet, MD  [facility]    To contact the attending provider between 7A-7P or the covering provider during after hours 7P-7A, please log into the web site [email or web address] and access using universal [place] password for that web site. If you do not have the password, please call the hospital operator.  11/22/2020, 12:00 PM

## 2020-11-22 NOTE — Progress Notes (Signed)
Pharmacy Antibiotic Note  [name] is a 50 y.o. female admitted on 11/21/2020 with  osteomyelitis .  Pharmacy has been consulted for vancomycin dosing.  Plan: Vancomycin 1gm given 8/22@0648  Will f/u HD schedule for future doses   Height: 5\' 7"  (170.2 cm) Weight: 60.8 kg (134 lb 0.6 oz) IBW/kg (Calculated) : 61.6  Temp (24hrs), Avg:99 F (37.2 C), Min:98 F (36.7 C), Max:101 F (38.3 C)  Recent Labs  Lab 11/21/20 0512 11/21/20 0550 11/21/20 1227 11/21/20 1533 11/21/20 1924 11/22/20 0016  WBC  --  10.3 7.8  --   --   --   CREATININE  --  7.19* 6.91* 7.56* 7.74* 8.24*  LATICACIDVEN 3.8*  --  1.6  --   --   --     Estimated Creatinine Clearance: 7.9 mL/min (A) (by C-G formula based on SCr of 8.24 mg/dL (H)).    Allergies  Allergen Reactions   Cleocin [Clindamycin Hcl] Diarrhea   Lisinopril Other (See Comments)    Elevated potassium per pt report   Amoxicillin Diarrhea, Nausea Only and Other (See Comments)    Did it involve swelling of the face/tongue/throat, SOB, or low BP? No Did it involve sudden or severe rash/hives, skin peeling, or any reaction on the inside of your mouth or nose? No Did you need to seek medical attention at a hospital or doctor's office? No When did it last happen?      10 + years If all above answers are "NO", may proceed with cephalosporin use.     Bactrim [Sulfamethoxazole-Trimethoprim] Diarrhea and Nausea Only    Thank you for allowing pharmacy to be a part of this patient's care.  Excell Seltzer Poteet 11/22/2020 5:02 AM

## 2020-11-22 NOTE — Progress Notes (Addendum)
EEG techs attempted hook up nurses were in the room. Nurses said EEG was highly unlikely. Patient wouldn't sit still nor comply.

## 2020-11-22 NOTE — Progress Notes (Signed)
[facility] night shift PCU coverage note.  The insulin infusion was restarted after the nursing staff communicated to [place] the most recent lab results.  Vancomycin per pharmacy consult was placed as well.  The patient was seen at bedside and responded to verbal stimuli, but does not follow simple commands. This has been the same throughout this shift. She was mildly febrile.  Lungs were clear.  Heart S1-S2, RRR. She has also been having bilateral eyes and periorbital edema.  She has been restless and risking pulling her IV catheter out.  Soft restraints and 4 mg of haloperidol IVP x1 ordered.  Tennis Must, MD.

## 2020-11-22 NOTE — Progress Notes (Signed)
Inpatient Diabetes Program Recommendations  AACE/ADA: New Consensus Statement on Inpatient Glycemic Control (2015)  Target Ranges:  Prepandial:   less than 140 mg/dL      Peak postprandial:   less than 180 mg/dL (1-2 hours)      Critically ill patients:  140 - 180 mg/dL   Lab Results  Component Value Date   GLUCAP 134 (H) 11/22/2020   HGBA1C 8.1 (A) 11/02/2020    Review of Glycemic Control Results for [name], [name]" (MRN [id number]) as of 11/22/2020 09:48  Ref. Range 11/22/2020 06:10 11/22/2020 06:42 11/22/2020 07:17 11/22/2020 08:09  Glucose-Capillary Latest Ref Range: 70 - 99 mg/dL 523 (HH) 409 (H) 363 (H) 303 (H)   Diabetes history: T1DM (requires basal/bolus) Outpatient Diabetes medications: Insulin pump Current orders for Inpatient glycemic control: IV insulin  Inpatient Diabetes Program Recommendations:    Continue IV insulin until acidosis clears.   When ready to transition consider: -Semglee 15 units two hours prior to discontinuation of IV insulin, then QD to follow. -Novolog 0-6 units TID -Novolog 2 units TID (assuming patient is consuming >50% of meal)  Given sepsis and altered mentation would not recommend insulin pump for transition.  Thanks, Bronson Curb, MSN, RNC-OB Diabetes Coordinator [phone] (8a-5p)

## 2020-11-23 ENCOUNTER — Other Ambulatory Visit (HOSPITAL_COMMUNITY): Payer: Medicare HMO

## 2020-11-23 ENCOUNTER — Inpatient Hospital Stay (HOSPITAL_COMMUNITY): Payer: Medicare HMO

## 2020-11-23 DIAGNOSIS — R7881 Bacteremia: Secondary | ICD-10-CM

## 2020-11-23 DIAGNOSIS — B9562 Methicillin resistant Staphylococcus aureus infection as the cause of diseases classified elsewhere: Secondary | ICD-10-CM

## 2020-11-23 DIAGNOSIS — G9341 Metabolic encephalopathy: Secondary | ICD-10-CM

## 2020-11-23 DIAGNOSIS — E1011 Type 1 diabetes mellitus with ketoacidosis with coma: Secondary | ICD-10-CM

## 2020-11-23 LAB — GASTROINTESTINAL PANEL BY PCR, STOOL (REPLACES STOOL CULTURE)

## 2020-11-23 LAB — RAPID URINE DRUG SCREEN, HOSP PERFORMED
Amphetamines: NOT DETECTED
Barbiturates: NOT DETECTED
Benzodiazepines: NOT DETECTED
Cocaine: NOT DETECTED
Opiates: NOT DETECTED
Tetrahydrocannabinol: NOT DETECTED

## 2020-11-23 LAB — RENAL FUNCTION PANEL
Albumin: 3.1 g/dL — ABNORMAL LOW (ref 3.5–5.0)
Anion gap: 19 — ABNORMAL HIGH (ref 5–15)
BUN: 79 mg/dL — ABNORMAL HIGH (ref 6–20)
CO2: 16 mmol/L — ABNORMAL LOW (ref 22–32)
Calcium: 8.2 mg/dL — ABNORMAL LOW (ref 8.9–10.3)
Chloride: 95 mmol/L — ABNORMAL LOW (ref 98–111)
Creatinine, Ser: 9.95 mg/dL — ABNORMAL HIGH (ref 0.44–1.00)
GFR, Estimated: 4 mL/min — ABNORMAL LOW (ref >60)
Glucose, Bld: 140 mg/dL — ABNORMAL HIGH (ref 70–99)
Phosphorus: 6 mg/dL — ABNORMAL HIGH (ref 2.5–4.6)
Potassium: 4.9 mmol/L (ref 3.5–5.1)
Sodium: 130 mmol/L — ABNORMAL LOW (ref 135–145)

## 2020-11-23 LAB — URINALYSIS, ROUTINE W REFLEX MICROSCOPIC
Bacteria, UA: NONE SEEN
Bilirubin Urine: NEGATIVE
Glucose, UA: >=500 mg/dL — AB
Hgb urine dipstick: NEGATIVE
Ketones, ur: 5 mg/dL — AB
Leukocytes,Ua: NEGATIVE
Nitrite: NEGATIVE
Protein, ur: 100 mg/dL — AB
Specific Gravity, Urine: 1.012 (ref 1.005–1.030)
pH: 7 (ref 5.0–8.0)

## 2020-11-23 LAB — C DIFFICILE QUICK SCREEN W PCR REFLEX
C Diff antigen: NEGATIVE
C Diff interpretation: NOT DETECTED
C Diff toxin: NEGATIVE

## 2020-11-23 LAB — HEPATIC FUNCTION PANEL
ALT: 32 U/L (ref 0–44)
AST: 26 U/L (ref 15–41)
Albumin: 3 g/dL — ABNORMAL LOW (ref 3.5–5.0)
Alkaline Phosphatase: 156 U/L — ABNORMAL HIGH (ref 38–126)
Bilirubin, Direct: <0.1 mg/dL (ref 0.0–0.2)
Total Bilirubin: 0.9 mg/dL (ref 0.3–1.2)
Total Protein: 6.6 g/dL (ref 6.5–8.1)

## 2020-11-23 LAB — GLUCOSE, CAPILLARY
Glucose-Capillary: 103 mg/dL — ABNORMAL HIGH (ref 70–99)
Glucose-Capillary: 105 mg/dL — ABNORMAL HIGH (ref 70–99)
Glucose-Capillary: 113 mg/dL — ABNORMAL HIGH (ref 70–99)
Glucose-Capillary: 114 mg/dL — ABNORMAL HIGH (ref 70–99)
Glucose-Capillary: 115 mg/dL — ABNORMAL HIGH (ref 70–99)
Glucose-Capillary: 115 mg/dL — ABNORMAL HIGH (ref 70–99)
Glucose-Capillary: 123 mg/dL — ABNORMAL HIGH (ref 70–99)
Glucose-Capillary: 129 mg/dL — ABNORMAL HIGH (ref 70–99)
Glucose-Capillary: 130 mg/dL — ABNORMAL HIGH (ref 70–99)
Glucose-Capillary: 145 mg/dL — ABNORMAL HIGH (ref 70–99)
Glucose-Capillary: 146 mg/dL — ABNORMAL HIGH (ref 70–99)
Glucose-Capillary: 158 mg/dL — ABNORMAL HIGH (ref 70–99)
Glucose-Capillary: 159 mg/dL — ABNORMAL HIGH (ref 70–99)
Glucose-Capillary: 181 mg/dL — ABNORMAL HIGH (ref 70–99)
Glucose-Capillary: 190 mg/dL — ABNORMAL HIGH (ref 70–99)
Glucose-Capillary: 203 mg/dL — ABNORMAL HIGH (ref 70–99)

## 2020-11-23 LAB — CBC
HCT: 28.6 % — ABNORMAL LOW (ref 36.0–46.0)
Hemoglobin: 9.6 g/dL — ABNORMAL LOW (ref 12.0–15.0)
MCH: 29.8 pg (ref 26.0–34.0)
MCHC: 33.6 g/dL (ref 30.0–36.0)
MCV: 88.8 fL (ref 80.0–100.0)
Platelets: 193 10*3/uL (ref 150–400)
RBC: 3.22 MIL/uL — ABNORMAL LOW (ref 3.87–5.11)
RDW: 14.1 % (ref 11.5–15.5)
WBC: 7.9 10*3/uL (ref 4.0–10.5)
nRBC: 0 % (ref 0.0–0.2)

## 2020-11-23 LAB — C-REACTIVE PROTEIN: CRP: 1.1 mg/dL — ABNORMAL HIGH (ref <1.0)

## 2020-11-23 LAB — SEDIMENTATION RATE: Sed Rate: 42 mm/hr — ABNORMAL HIGH (ref 0–22)

## 2020-11-23 LAB — HEPATITIS B SURFACE ANTIGEN: Hepatitis B Surface Ag: NONREACTIVE

## 2020-11-23 LAB — AMMONIA: Ammonia: 51 umol/L — ABNORMAL HIGH (ref 9–35)

## 2020-11-23 MED ORDER — HYDRALAZINE HCL 20 MG/ML IJ SOLN
10.0000 mg | INTRAMUSCULAR | Status: DC | PRN
  Administered 2020-11-23 – 2020-11-28 (×5): 10 mg via INTRAVENOUS
  Filled 2020-11-23 (×6): qty 1

## 2020-11-23 MED ORDER — VANCOMYCIN HCL 750 MG/150ML IV SOLN: 750.0000 mg | INTRAVENOUS | Status: AC

## 2020-11-23 MED ORDER — INSULIN ASPART 100 UNIT/ML IJ SOLN
0.0000 [IU] | Freq: Three times a day (TID) | INTRAMUSCULAR | Status: DC
  Administered 2020-11-23 – 2020-11-24 (×2): 1 [IU] via SUBCUTANEOUS
  Administered 2020-11-24: 3 [IU] via SUBCUTANEOUS
  Administered 2020-11-26: 2 [IU] via SUBCUTANEOUS
  Administered 2020-11-26: 1 [IU] via SUBCUTANEOUS
  Administered 2020-11-26 – 2020-11-27 (×2): 3 [IU] via SUBCUTANEOUS
  Administered 2020-11-28: 6 [IU] via SUBCUTANEOUS

## 2020-11-23 MED ORDER — VANCOMYCIN HCL 750 MG/150ML IV SOLN
INTRAVENOUS | Status: AC
  Administered 2020-11-23: 750 mg via INTRAVENOUS
  Filled 2020-11-23: qty 150

## 2020-11-23 MED ORDER — SODIUM CHLORIDE 0.9 % IV SOLN
1.0000 g | Freq: Every day | INTRAVENOUS | Status: DC
  Filled 2020-11-23: qty 1

## 2020-11-23 MED ORDER — IOHEXOL 300 MG/ML  SOLN
75.0000 mL | Freq: Once | INTRAMUSCULAR | Status: AC | PRN
  Administered 2020-11-23: 75 mL via INTRAVENOUS

## 2020-11-23 MED ORDER — SODIUM CHLORIDE 0.9 % IV SOLN
2.0000 g | INTRAVENOUS | Status: DC
  Filled 2020-11-23: qty 2

## 2020-11-23 MED ORDER — HYDRALAZINE HCL 20 MG/ML IJ SOLN: 10.0000 mg | INTRAMUSCULAR | Status: DC | PRN

## 2020-11-23 MED ORDER — INSULIN GLARGINE-YFGN 100 UNIT/ML ~~LOC~~ SOLN
7.0000 [IU] | Freq: Every day | SUBCUTANEOUS | Status: DC
  Administered 2020-11-23 – 2020-11-27 (×5): 7 [IU] via SUBCUTANEOUS
  Filled 2020-11-23 (×5): qty 0.07

## 2020-11-23 NOTE — Progress Notes (Signed)
[facility] night shift PCU coverage note.  The nursing staff reported that the patient is having more periods of restlessness and is pulling/throwing things off her bed.  The nursing staff also reported that her clinical picture looks like she is withdrawing from something.  UDS has been ordered.  Her bladder scan showed 519 mL of urine.  In and out cath as needed ordered as well.  Tennis Must, MD.

## 2020-11-23 NOTE — Progress Notes (Signed)
Patient still at Dialysis at this time - will attempt EEG as schedule permits. Patient will potentially need Haldol per RN

## 2020-11-23 NOTE — Progress Notes (Signed)
Pt has returned from CT. Calm, resting in bed.

## 2020-11-23 NOTE — Progress Notes (Signed)
Pt transferred to HD via bed. Insulin drip & D5 infusion stopped per Dr. Sloan Leiter order. SSI started. CBG at 0930 was 146. VSS. CHG bath given this AM. Bilateral wrist restraints in place, rectal pouch intact.

## 2020-11-23 NOTE — Progress Notes (Signed)
EMIL, KLASSEN ([id number]) Visit Report for 11/17/2020 Arrival Information Details Patient Name: Date of Service: [name] 11/17/2020 2:00 PM Medical Record Number: [id number] Patient Account Number: [id number] Date of Birth/Sex: Treating RN: June 07, 1970 (50 y.o. [name], Meta.Reding Primary Care Hakeen Shipes: [name], [name] Other Clinician: Referring Barlow Harrison: Treating Envy Meno/Extender: [name], [name] Weeks in Treatment: 23 Visit Information History Since Last Visit Added or deleted any medications: No Patient Arrived: Wheel Chair Any new allergies or adverse reactions: No Arrival Time: 15:14 Had a fall or experienced change in No Accompanied By: self activities of daily living that may affect Transfer Assistance: None risk of falls: Patient Identification Verified: Yes Signs or symptoms of abuse/neglect since last visito No Secondary Verification Process Completed: Yes Hospitalized since last visit: No Patient Requires Transmission-Based Precautions: No Implantable device outside of the clinic excluding No Patient Has Alerts: No cellular tissue based products placed in the center since last visit: Has Dressing in Place as Prescribed: Yes Pain Present Now: No Electronic Signature(s) Signed: 11/23/2020 9:12:52 AM By: Sandre Kitty Entered By: Sandre Kitty on 11/17/2020 15:19:17 -------------------------------------------------------------------------------- Clinic Level of Care Assessment Details Patient Name: Date of Service: [name] 11/17/2020 2:00 PM Medical Record Number: [id number] Patient Account Number: [id number] Date of Birth/Sex: Treating RN: 25-Feb-1971 (50 y.o. Nancy Fetter Primary Care Jaysa Kise: [name], [name] Other Clinician: Referring Kanai Hilger: Treating Nas Wafer/Extender: [name], [name] Weeks in Treatment: 115 Clinic Level of Care Assessment Items TOOL 4 Quantity Score X- 1  0 Use when only an EandM is performed on FOLLOW-UP visit ASSESSMENTS - Nursing Assessment / Reassessment X- 1 10 Reassessment of Co-morbidities (includes updates in patient status) X- 1 5 Reassessment of Adherence to Treatment Plan ASSESSMENTS - Wound and Skin A ssessment / Reassessment []  - 0 Simple Wound Assessment / Reassessment - one wound X- 1 5 Complex Wound Assessment / Reassessment - multiple wounds []  - 0 Dermatologic / Skin Assessment (not related to wound area) ASSESSMENTS - Focused Assessment []  - 0 Circumferential Edema Measurements - multi extremities []  - 0 Nutritional Assessment / Counseling / Intervention X- 1 5 Lower Extremity Assessment (monofilament, tuning fork, pulses) []  - 0 Peripheral Arterial Disease Assessment (using hand held doppler) ASSESSMENTS - Ostomy and/or Continence Assessment and Care []  - 0 Incontinence Assessment and Management []  - 0 Ostomy Care Assessment and Management (repouching, etc.) PROCESS - Coordination of Care X - Simple Patient / Family Education for ongoing care 1 15 []  - 0 Complex (extensive) Patient / Family Education for ongoing care X- 1 10 Staff obtains Programmer, systems, Records, T Results / Process Orders est []  - 0 Staff telephones HHA, Nursing Homes / Clarify orders / etc []  - 0 Routine Transfer to another Facility (non-emergent condition) []  - 0 Routine Hospital Admission (non-emergent condition) []  - 0 New Admissions / Biomedical engineer / Ordering NPWT Apligraf, etc. , []  - 0 Emergency Hospital Admission (emergent condition) X- 1 10 Simple Discharge Coordination []  - 0 Complex (extensive) Discharge Coordination PROCESS - Special Needs []  - 0 Pediatric / Minor Patient Management []  - 0 Isolation Patient Management []  - 0 Hearing / Language / Visual special needs []  - 0 Assessment of Community assistance (transportation, D/C planning, etc.) []  - 0 Additional assistance / Altered mentation []  -  0 Support Surface(s) Assessment (bed, cushion, seat, etc.) INTERVENTIONS - Wound Cleansing / Measurement []  - 0 Simple Wound Cleansing -  one wound X- 2 5 Complex Wound Cleansing - multiple wounds X- 1 5 Wound Imaging (photographs - any number of wounds) $RemoveBe'[]'WlJmHfhcu$  - 0 Wound Tracing (instead of photographs) $RemoveBeforeD'[]'JmjKlVqudyZnZu$  - 0 Simple Wound Measurement - one wound X- 2 5 Complex Wound Measurement - multiple wounds INTERVENTIONS - Wound Dressings X - Small Wound Dressing one or multiple wounds 1 10 X- 1 15 Medium Wound Dressing one or multiple wounds $RemoveBeforeD'[]'WGbjnFpDjCnqTZ$  - 0 Large Wound Dressing one or multiple wounds X- 1 5 Application of Medications - topical $RemoveB'[]'QIRjNvvT$  - 0 Application of Medications - injection INTERVENTIONS - Miscellaneous $RemoveBeforeD'[]'OoZQiyxuJhhpyg$  - 0 External ear exam $Remove'[]'mvNqXlG$  - 0 Specimen Collection (cultures, biopsies, blood, body fluids, etc.) $RemoveBefor'[]'dbtCdjyBrCyR$  - 0 Specimen(s) / Culture(s) sent or taken to Lab for analysis $RemoveBefo'[]'pXufWxvvgtG$  - 0 Patient Transfer (multiple staff / Civil Service fast streamer / Similar devices) $RemoveBeforeDE'[]'XXiRUPDsHwhDTrY$  - 0 Simple Staple / Suture removal (25 or less) $Remove'[]'diuzzBO$  - 0 Complex Staple / Suture removal (26 or more) $Remove'[]'SJZiCyq$  - 0 Hypo / Hyperglycemic Management (close monitor of Blood Glucose) $RemoveBefore'[]'OfbwRsQOwjApT$  - 0 Ankle / Brachial Index (ABI) - do not check if billed separately X- 1 5 Vital Signs Has the patient been seen at the hospital within the last three years: Yes Total Score: 120 Level Of Care: New/Established - Level 4 Electronic Signature(s) Signed: 11/17/2020 6:20:17 PM By: [name] RN, BSN Entered By: [name] on 11/17/2020 16:05:01 -------------------------------------------------------------------------------- Encounter Discharge Information Details Patient Name: Date of Service: [place], [name] 11/17/2020 2:00 PM Medical Record Number: [id number] Patient Account Number: [id number] Date of Birth/Sex: Treating RN: 12/05/1970 (50 y.o. Nancy Fetter Primary Care Faline Langer: [name], [name] Other Clinician: Referring Talbot Monarch: Treating  Seymore Brodowski/Extender: [name], [name] Weeks in Treatment: 115 Encounter Discharge Information Items Discharge Condition: Stable Ambulatory Status: Wheelchair Discharge Destination: Home Transportation: Private Auto Accompanied By: alone Schedule Follow-up Appointment: Yes Clinical Summary of Care: Patient Declined Electronic Signature(s) Signed: 11/17/2020 6:20:17 PM By: [name] RN, BSN Entered By: [name] on 11/17/2020 16:05:48 -------------------------------------------------------------------------------- Lower Extremity Assessment Details Patient Name: Date of Service: [name] 11/17/2020 2:00 PM Medical Record Number: [id number] Patient Account Number: [id number] Date of Birth/Sex: Treating RN: 11-May-1970 (50 y.o. Nancy Fetter Primary Care Lendell Gallick: [name], [name] Other Clinician: Referring Lillar Bianca: Treating Javontae Marlette/Extender: [name], [name] Weeks in Treatment: 115 Edema Assessment Assessed: [Left: No] [Right: No] Edema: [Left: No] [Right: No] Calf Left: Right: Point of Measurement: 30 cm From Medial Instep 29 cm 28 cm Ankle Left: Right: Point of Measurement: 9 cm From Medial Instep 21 cm 19.5 cm Vascular Assessment Pulses: Dorsalis Pedis Palpable: [Left:Yes] [Right:Yes] Electronic Signature(s) Signed: 11/17/2020 6:20:17 PM By: [name] RN, BSN Entered By: [name] on 11/17/2020 15:41:59 -------------------------------------------------------------------------------- Multi Wound Chart Details Patient Name: Date of Service: [name] 11/17/2020 2:00 PM Medical Record Number: [id number] Patient Account Number: [id number] Date of Birth/Sex: Treating RN: [birth year] (50 y.o. [name] Primary Care Dorita Rowlands: [name], [name] Other Clinician: Referring Ra Pfiester: Treating Harout Scheurich/Extender: [name], [name] Weeks in Treatment: 115 Vital  Signs Height(in): 48 Pulse(bpm): 61 Weight(lbs): 125 Blood Pressure(mmHg): 190/80 Body Mass Index(BMI): 20 Temperature(F): 98.6 Respiratory Rate(breaths/min): 16 Photos: [N/A:N/A] Left T Second oe Right Foot N/A Wound Location: Trauma Gradually Appeared N/A Wounding Event: Diabetic Wound/Ulcer of the Lower Diabetic Wound/Ulcer of the Lower N/A Primary Etiology: Extremity Extremity Cataracts, Chronic sinus Cataracts,  Chronic sinus N/A Comorbid History: problems/congestion, Anemia, Sleep problems/congestion, Anemia, Sleep Apnea, Deep Vein Thrombosis, Apnea, Deep Vein Thrombosis, Hypertension, Peripheral Arterial Hypertension, Peripheral Arterial Disease, Type I Diabetes, Disease, Type I Diabetes, Osteoarthritis, Osteomyelitis, Osteoarthritis, Osteomyelitis, Neuropathy, Seizure Disorder Neuropathy, Seizure Disorder 07/29/2020 09/02/2020 N/A Date Acquired: 15 10 N/A Weeks of Treatment: Open Open N/A Wound Status: No Yes N/A Clustered Wound: N/A 2 N/A Clustered Quantity: 0.6x0.9x0.3 0.7x3.4x0.1 N/A Measurements L x W x D (cm) 0.424 1.869 N/A A (cm) : rea 0.127 0.187 N/A Volume (cm) : 64.00% 39.90% N/A % Reduction in A rea: 64.00% 69.90% N/A % Reduction in Volume: Grade 2 Grade 2 N/A Classification: Medium Medium N/A Exudate A mount: Serosanguineous Serosanguineous N/A Exudate Type: red, brown red, brown N/A Exudate Color: Distinct, outline attached Distinct, outline attached N/A Wound Margin: Large (67-100%) Medium (34-66%) N/A Granulation A mount: Red Pink N/A Granulation Quality: None Present (0%) Medium (34-66%) N/A Necrotic A mount: Fat Layer (Subcutaneous Tissue): Yes Fat Layer (Subcutaneous Tissue): Yes N/A Exposed Structures: Fascia: No Fascia: No Tendon: No Tendon: No Muscle: No Muscle: No Joint: No Joint: No Bone: No Bone: No None Small (1-33%) N/A Epithelialization: Treatment Notes Electronic Signature(s) Signed: 11/17/2020 4:52:40 PM  By: Linton Ham MD Signed: 11/17/2020 6:11:30 PM By: Deon Pilling Entered By: Linton Ham on 11/17/2020 15:56:19 -------------------------------------------------------------------------------- Multi-Disciplinary Care Plan Details Patient Name: Date of Service: [name] 11/17/2020 2:00 PM Medical Record Number: [id number] Patient Account Number: [id number] Date of Birth/Sex: Treating RN: 1970/12/11 (50 y.o. Nancy Fetter Primary Care Majorie Santee: [name], [name] Other Clinician: Referring Dianne Bady: Treating Eri Platten/Extender: [name], [name] Weeks in Treatment: [place] reviewed with physician Active Inactive Nutrition Nursing Diagnoses: Imbalanced nutrition Impaired glucose control: actual or potential Potential for alteratiion in Nutrition/Potential for imbalanced nutrition Goals: Patient/caregiver agrees to and verbalizes understanding of need to use nutritional supplements and/or vitamins as prescribed Date Initiated: 09/03/2018 Date Inactivated: 10/07/2018 Target Resolution Date: 10/01/2018 Goal Status: Met Patient/caregiver will maintain therapeutic glucose control Date Initiated: 09/03/2018 Target Resolution Date: 11/25/2020 Goal Status: Active Interventions: Provide education on elevated blood sugars and impact on wound healing Treatment Activities: Patient referred to Primary Care Physician for further nutritional evaluation : 09/03/2018 Notes: 07/29/20: Glucose control ongoing, target date extended. Wound/Skin Impairment Nursing Diagnoses: Impaired tissue integrity Knowledge deficit related to ulceration/compromised skin integrity Goals: Patient/caregiver will verbalize understanding of skin care regimen Date Initiated: 09/03/2018 Target Resolution Date: 11/25/2020 Goal Status: Active Ulcer/skin breakdown will have a volume reduction of 30% by week 4 Date Initiated: 09/03/2018 Date Inactivated:  10/07/2018 Target Resolution Date: 10/01/2018 Goal Status: Unmet Unmet Reason: COMORBITIES Ulcer/skin breakdown will have a volume reduction of 50% by week 8 Date Initiated: 10/07/2018 Date Inactivated: 11/03/2018 Target Resolution Date: 10/31/2018 Goal Status: Unmet Unmet Reason: Osteomyelitis Interventions: Assess patient/caregiver ability to obtain necessary supplies Assess patient/caregiver ability to perform ulcer/skin care regimen upon admission and as needed Assess ulceration(s) every visit Provide education on ulcer and skin care Treatment Activities: Skin care regimen initiated : 09/03/2018 Topical wound management initiated : 09/03/2018 Notes: Electronic Signature(s) Signed: 11/17/2020 6:20:17 PM By: [name] RN, BSN Entered By: [name] on 11/17/2020 15:44:53 -------------------------------------------------------------------------------- Pain Assessment Details Patient Name: Date of Service: [place], [name] 11/17/2020 2:00 PM Medical Record Number: [id number] Patient Account Number: [id number] Date of Birth/Sex: Treating RN: [birth year] (50 y.o. [name] Primary Care Samanthamarie Ezzell: [name], [name] Other Clinician: Referring Shane Badeaux:  Treating Yazhini Mcaulay/Extender: [name], [name] Weeks in Treatment: 115 Active Problems Location of Pain Severity and Description of Pain Patient Has Paino No Site Locations Pain Management and Medication Current Pain Management: Electronic Signature(s) Signed: 11/17/2020 6:11:30 PM By: Deon Pilling Signed: 11/23/2020 9:12:52 AM By: Sandre Kitty Entered By: Sandre Kitty on 11/17/2020 15:26:12 -------------------------------------------------------------------------------- Patient/Caregiver Education Details Patient Name: Date of Service: [place] LLO [place] [name] 8/18/2022andnbsp2:00 PM Medical Record Number: [id number] Patient Account Number: [id number] Date of Birth/Gender: Treating  RN: 12/20/1970 (50 y.o. Nancy Fetter Primary Care Physician: [name], [name] Other Clinician: Referring Physician: Treating Physician/Extender: [name], [name] Weeks in Treatment: 115 Education Assessment Education Provided To: Patient Education Topics Provided Wound/Skin Impairment: Methods: Explain/Verbal Responses: State content correctly Electronic Signature(s) Signed: 11/17/2020 6:20:17 PM By: [name] RN, BSN Entered By: [name] on 11/17/2020 15:45:08 -------------------------------------------------------------------------------- Wound Assessment Details Patient Name: Date of Service: [name] 11/17/2020 2:00 PM Medical Record Number: [id number] Patient Account Number: [id number] Date of Birth/Sex: Treating RN: 06-14-1970 (50 y.o. [name], Meta.Reding Primary Care Codi Folkerts: [name], [name] Other Clinician: Referring Rudie Rikard: Treating Rozetta Stumpp/Extender: [name], [name] Weeks in Treatment: 115 Wound Status Wound Number: 51 Primary Diabetic Wound/Ulcer of the Lower Extremity Etiology: Wound Location: Left T Second oe Wound Open Wounding Event: Trauma Status: Date Acquired: 07/29/2020 Comorbid Cataracts, Chronic sinus problems/congestion, Anemia, Sleep Weeks Of Treatment: 15 History: Apnea, Deep Vein Thrombosis, Hypertension, Peripheral Arterial Clustered Wound: No Disease, Type I Diabetes, Osteoarthritis, Osteomyelitis, Neuropathy, Seizure Disorder Photos Wound Measurements Length: (cm) 0.6 Width: (cm) 0.9 Depth: (cm) 0.3 Area: (cm) 0.424 Volume: (cm) 0.127 % Reduction in Area: 64% % Reduction in Volume: 64% Epithelialization: None Tunneling: No Undermining: No Wound Description Classification: Grade 2 Wound Margin: Distinct, outline attached Exudate Amount: Medium Exudate Type: Serosanguineous Exudate Color: red, brown Foul Odor After Cleansing: No Slough/Fibrino No Wound  Bed Granulation Amount: Large (67-100%) Exposed Structure Granulation Quality: Red Fascia Exposed: No Necrotic Amount: None Present (0%) Fat Layer (Subcutaneous Tissue) Exposed: Yes Tendon Exposed: No Muscle Exposed: No Joint Exposed: No Bone Exposed: No Treatment Notes Wound #51 (Toe Second) Wound Laterality: Left Cleanser Soap and Water Discharge Instruction: May shower and wash wound with dial antibacterial soap and water prior to dressing change. Peri-Wound Care Topical Primary Dressing Promogran Prisma Matrix, 4.34 (sq in) (silver collagen) Discharge Instruction: Moisten collagen with saline or hydrogel Secondary Dressing Woven Gauze Sponge, Non-Sterile 4x4 in Discharge Instruction: Apply over primary dressing as directed. Secured With Conforming Stretch Gauze Bandage, Sterile 2x75 (in/in) Discharge Instruction: Secure with stretch gauze as directed. Paper Tape, 1x10 (in/yd) Discharge Instruction: Secure dressing with tape as directed. Compression Wrap Compression Stockings Add-Ons Electronic Signature(s) Signed: 11/17/2020 6:11:30 PM By: Deon Pilling Signed: 11/17/2020 6:20:17 PM By: [name] RN, BSN Entered By: [name] on 11/17/2020 15:42:29 -------------------------------------------------------------------------------- Wound Assessment Details Patient Name: Date of Service: [name] 11/17/2020 2:00 PM Medical Record Number: [id number] Patient Account Number: [id number] Date of Birth/Sex: Treating RN: 07/19/1970 (50 y.o. Nancy Fetter Primary Care Dashana Guizar: [name], [name] Other Clinician: Referring Mallory Schaad: Treating Josia Cueva/Extender: [name], [name] Weeks in Treatment: 115 Wound Status Wound Number: 52 Primary Diabetic Wound/Ulcer of the Lower Extremity Etiology: Wound Location: Right Foot Wound Open Wounding Event: Gradually Appeared Status: Date Acquired: 09/02/2020 Comorbid Cataracts, Chronic sinus  problems/congestion, Anemia, Sleep Weeks Of Treatment: 10 History:  Apnea, Deep Vein Thrombosis, Hypertension, Peripheral Arterial Clustered Wound: Yes Disease, Type I Diabetes, Osteoarthritis, Osteomyelitis, Neuropathy, Seizure Disorder Photos Wound Measurements Length: (cm) 0.7 Width: (cm) 3.4 Depth: (cm) 0.1 Clustered Quantity: 2 Area: (cm) 1.869 Volume: (cm) 0.187 % Reduction in Area: 39.9% % Reduction in Volume: 69.9% Epithelialization: Small (1-33%) Tunneling: No Undermining: No Wound Description Classification: Grade 2 Wound Margin: Distinct, outline attached Exudate Amount: Medium Exudate Type: Serosanguineous Exudate Color: red, brown Foul Odor After Cleansing: No Slough/Fibrino Yes Wound Bed Granulation Amount: Medium (34-66%) Exposed Structure Granulation Quality: Pink Fascia Exposed: No Necrotic Amount: Medium (34-66%) Fat Layer (Subcutaneous Tissue) Exposed: Yes Tendon Exposed: No Muscle Exposed: No Joint Exposed: No Bone Exposed: No Treatment Notes Wound #52 (Foot) Wound Laterality: Right Cleanser Soap and Water Discharge Instruction: May shower and wash wound with dial antibacterial soap and water prior to dressing change. Peri-Wound Care Topical Primary Dressing PolyMem Non-Adhesive Dressing, 4x4 in Discharge Instruction: Apply to wound bed as instructed Secondary Dressing Woven Gauze Sponge, Non-Sterile 4x4 in Discharge Instruction: Apply over primary dressing as directed. Secured With Elastic Bandage 4 inch (ACE bandage) Discharge Instruction: Secure with ACE bandage as directed. Conforming Stretch Gauze Bandage, Sterile 2x75 (in/in) Discharge Instruction: Secure with stretch gauze as directed. Paper Tape, 1x10 (in/yd) Discharge Instruction: Secure dressing with tape as directed. Compression Wrap Compression Stockings Add-Ons Electronic Signature(s) Signed: 11/17/2020 6:20:17 PM By: [name] RN, BSN Signed: 11/17/2020 6:20:17 PM By:  [name] RN, BSN Entered By: [name] on 11/17/2020 15:43:25 -------------------------------------------------------------------------------- Vitals Details Patient Name: Date of Service: [place] LLO [name] 11/17/2020 2:00 PM Medical Record Number: [id number] Patient Account Number: [id number] Date of Birth/Sex: Treating RN: Apr 24, 1970 (50 y.o. [name], Meta.Reding Primary Care Ewart Carrera: [name], [name] Other Clinician: Referring Maxtyn Nuzum: Treating Chevy Sweigert/Extender: [name], [name] Weeks in Treatment: 115 Vital Signs Time Taken: 15:19 Temperature (F): 98.6 Height (in): 67 Pulse (bpm): 91 Weight (lbs): 125 Respiratory Rate (breaths/min): 16 Body Mass Index (BMI): 19.6 Blood Pressure (mmHg): 190/80 Reference Range: 80 - 120 mg / dl Electronic Signature(s) Signed: 11/23/2020 9:12:52 AM By: Sandre Kitty Entered By: Sandre Kitty on 11/17/2020 15:26:04

## 2020-11-23 NOTE — Progress Notes (Signed)
Pt back from HD. Currently calm, resting in bed - not responsive to questions. VSS. CBG 203. Notified EEG tech on patient return.

## 2020-11-23 NOTE — Progress Notes (Signed)
Subjective:  On Hd , no voiced response or movements with request  / Tolerating uf so far   Objective Vital signs in last 24 hours: Vitals:   11/23/20 1130 11/23/20 1200 11/23/20 1230 11/23/20 1300  BP: (!) 197/101 (!) 184/92 (!) 198/103 (!) 194/100  Pulse: (!) 101 (!) 104 (!) 106 (!) 106  Resp:      Temp:      TempSrc:      SpO2:      Weight:      Height:       Weight change:   Physical Exam:  General= thin chronically ill female Lethargic, not responding to voice, nad , facial edema mild  Heart: RRR, no mrg Lungs: CTA  Bilat. Non labored  breathing  Abdomen: Soft , NABS, NT, ND Extremities: trace pedal edema  bilat,  Dialysis Access: LUA AVF  patent on HD    Home meds: asa, phoslo 2 ac , insulin pump, pravachol, prn's    OP HD: AF TTS   4h  56kg  2/2 bath Hep none LUA AVF   - hect 9 ug IV tiw     Problem/Plan DKA / type 1 DM - am glucose 140, stabilizing plans per admit and . Please do not use LR or NS fluids without renal service approval.  OK to use straight D5W or D10W for BS support as needed.  Hyponatremia/ hyperkalemia - NA 130, K 4.9   improving with Glucose improved , also HD today  AMS - due to #1 and /or infection L foot osteomyelitis - on IV vanc/ cefepime ESRD - on HD TTS.  HD today OFF schedule 2/2 more urgent pts and low HD staffing  / fu labs and vol  BP/ volume - BP's wnl, 4kg up, UF 3-4 L as tol.  Anemia ckd - Hb 11,>9.6 ? 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 Vit d on hd corec ca ok   Ernest Haber, PA-C [facility] [phone] 11/23/2020,3:04 PM  LOS: 2 days   Labs: Basic Metabolic Panel: Recent Labs  Lab 11/22/20 1944 11/22/20 2218 11/23/20 0735  NA 129* 130* 130*  K 5.1 5.1 4.9  CL 94* 94* 95*  CO2 14* 18* 16*  GLUCOSE 176* 112* 140*  BUN 76* 78* 79*  CREATININE 9.69* 9.81* 9.95*  CALCIUM 8.3* 8.3* 8.2*  PHOS  --   --  6.0*   Liver  Function Tests: Recent Labs  Lab 11/21/20 0550 11/23/20 0735  AST 28 26  ALT 45* 32  ALKPHOS 242* 156*  BILITOT 0.7 0.9  PROT 8.1 6.6  ALBUMIN 3.9 3.0*  3.1*   Recent Labs  Lab 11/21/20 0550  LIPASE 40   Recent Labs  Lab 11/23/20 0852  AMMONIA 51*   CBC: Recent Labs  Lab 11/21/20 0550 11/21/20 1227 11/22/20 0714 11/22/20 1221 11/23/20 0141  WBC 10.3 7.8 8.9 6.4 7.9  NEUTROABS 8.6* 6.0  --   --   --   HGB 11.0* 10.2* 9.5* 10.1* 9.6*  HCT 32.8* 30.6* 28.1* 29.6* 28.6*  MCV 89.4 88.4 88.6 86.8 88.8  PLT 246 226 215 208 193   Cardiac Enzymes: No results for input(s): CKTOTAL, CKMB, CKMBINDEX, TROPONINI in the last 168 hours. CBG: Recent Labs  Lab 11/23/20 0716 11/23/20 0751 11/23/20 0827 11/23/20 0930 11/23/20 1357  GLUCAP 130* 158* 159* 146* 190*    Studies/Results: DG  Foot Complete Right  Result Date: 11/22/2020 CLINICAL DATA:  Wound, discharge EXAM: RIGHT FOOT COMPLETE - 3+ VIEW COMPARISON:  07/04/2020 FINDINGS: Frontal, oblique, lateral views of the right foot are obtained. 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 Extensive degenerative changes are seen throughout the hindfoot, unchanged since prior exam. There are no acute displaced fractures. The bones are diffusely osteopenic. No destructive changes or periosteal reaction to suggest osteomyelitis. There is mild diffuse soft tissue edema, most pronounced along the plantar medial aspect at the amputation site. No evidence of subcutaneous gas or radiopaque foreign body. Overlying bandage material is seen along the medial right foot. IMPRESSION: 1. Extensive postsurgical and degenerative changes as above, stable since previous study. 2. No acute or destructive bony lesions. 3. Prominent soft tissue swelling along the plantar medial aspect of the right foot. Electronically Signed   By: Randa Ngo M.D.   On: 11/22/2020 15:14   Medications:  vancomycin      Chlorhexidine  Gluconate Cloth  6 each Topical Q0600   collagenase   Topical Daily   insulin aspart  0-6 Units Subcutaneous TID WC   insulin glargine-yfgn  7 Units Subcutaneous Daily   sodium zirconium cyclosilicate  10 g Oral Once

## 2020-11-23 NOTE — Progress Notes (Addendum)
PROGRESS NOTE        PATIENT DETAILS Name: [name] Age: 50 y.o. Sex: female Date of Birth: 1970/11/09 Admit Date: 11/21/2020 Admitting Physician Jacqlyn Krauss, MD BJY:[id number], Babs Bertin, PA-C  Brief Narrative: Patient is a 50 y.o. female with history of DM-1, ESRD on HD-recent history of MRSA bacteremia-presented with acute metabolic encephalopathy, DKA and left diabetic foot with osteomyelitis.  Significant events: 7/14>> nausea/vomiting-left foot cellulitis-evaluated at urgent care in [place] on 7/14-blood cultures positive for MRSA.  Declined admission.  Subsequently received IV vancomycin with HD from 7/26-8/04 8/22>> presented to [facility] ED with confusion-found to have DKA  Significant studies: 8/22>> x-ray left foot: Osteomyelitis at the second phalange 8/22>> CXR: No pneumonia 8/22>> CT head: No acute intracranial abnormality  Antimicrobial therapy: Vancomycin: 8/22>> Cefepime: 8/22>>  Microbiology data: 8/22>> COVID/influenza PCR: Negative 8/22>> blood culture: Negative so far  Procedures : None  Consults: ID, nephrology  DVT Prophylaxis : SCDs Start: 11/21/20 1037   Subjective: Continues to be pretty encephalopathic-asking "help me".  In restraints.  Currently in hemodialysis.   Assessment/Plan: Acute metabolic encephalopathy: Initially suspicion was from DKA/acute osteomyelitis/elevated BUN-however per ID-it appears that she may have had undertreated MRSA bacteremic infection.  We will try and obtain MRI of her brain to rule out septic emboli.  Awaiting EEG.  Reassess to see if any improvement after hemodialysis-currently ongoing.  Facial swelling: Etiology felt to be either due to volume issues related to HD-or due to underlying sinus infection.  Given history of recent MRSA infection-lack of improvement of encephalopathy-we will go ahead and obtain a CT maxillofacial area.  Sepsis due to acute osteomyelitis involving the  left second phalanx-possibility of underlying MRSA infection: Per ID note-patient had positive blood cultures with MRSA in [place] on 7/14-it appears that she declined admission and apparently received treatment with IV vancomycin with hemodialysis from 7/26-8/4.  Evaluated by podiatry on 8/23-she appears to have more of a chronic osteomyelitis rather than a acute osteomyelitis. Blood cultures on 8/22 negative so far-continue IV vancomycin-echo ordered for vegetation screening.  DKA history of DM 1 on insulin pump (A1c 8.1 on 8/3): Suspect she is out of DKA-her mild metabolic acidosis this morning is probably from not being dialyzed yesterday.  Due to logistic issues insulin drip cannot be performed while at HD-stop insulin drip-transition to Lantus/sensitive sliding scale.  Continue to follow CBGs closely.  On an insulin pump at home-due to her mentation-not a candidate to go on insulin pump currently.  Diarrhea:?  Etiology-unclear if patient has bowel related autonomic neuropathy in the setting of diabetes.  C. difficile studies and GI pathogen panel negative.  Will use prn Imodium.  ESRD: Nephrology following-we will defer HD to nephrology.  Normocytic anemia: Due to ESRD-defer need for EPO/IV iron to nephrology.  HLD: Resume statin when able  History of right transmetatarsal amputation  Diet: Diet Order             Diet NPO time specified  Diet effective now                    Code Status: Full code or DNR  Family Communication: Spoke with sister-Alicia [phone] over phone on 8/24  Disposition Plan: Status is: Inpatient  Remains inpatient appropriate because:Inpatient level of care appropriate due to severity of illness  Dispo: The patient is from:  Home              Anticipated d/c is to: Home              Patient currently is not medically stable to d/c.   Difficult to place patient No   Barriers to Discharge: Continues to be encephalopathic-not yet  stable for discharge.  Antimicrobial agents: Anti-infectives (From admission, onward)    Start     Dose/Rate Route Frequency Ordered Stop   11/23/20 1800  ceFEPIme (MAXIPIME) 1 g in sodium chloride 0.9 % 100 mL IVPB  Status:  Discontinued        1 g 200 mL/hr over 30 Minutes Intravenous Daily-1800 11/23/20 1126 11/23/20 1145   11/23/20 1030  vancomycin (VANCOREADY) IVPB 750 mg/150 mL        750 mg 150 mL/hr over 60 Minutes Intravenous To Hemodialysis 11/23/20 1021 11/23/20 1305   11/23/20 1030  ceFEPIme (MAXIPIME) 2 g in sodium chloride 0.9 % 100 mL IVPB  Status:  Discontinued        2 g 200 mL/hr over 30 Minutes Intravenous To Hemodialysis 11/23/20 1021 11/23/20 1125   11/22/20 1800  ceFEPIme (MAXIPIME) 2 g in sodium chloride 0.9 % 100 mL IVPB  Status:  Discontinued        2 g 200 mL/hr over 30 Minutes Intravenous Every T-Th-Sa (Hemodialysis) 11/21/20 2018 11/22/20 0816   11/22/20 1800  ceFEPIme (MAXIPIME) 2 g in sodium chloride 0.9 % 100 mL IVPB  Status:  Discontinued        2 g 200 mL/hr over 30 Minutes Intravenous Every T-Th-Sa (1800) 11/22/20 0816 11/23/20 1125   11/22/20 1500  vancomycin (VANCOREADY) IVPB 750 mg/150 mL        750 mg 150 mL/hr over 60 Minutes Intravenous Every T-Th-Sa (Hemodialysis) 11/22/20 1357     11/22/20 1000  ceFEPIme (MAXIPIME) 2 g in sodium chloride 0.9 % 100 mL IVPB  Status:  Discontinued       Note to Pharmacy: Pharmacy to dose   2 g 200 mL/hr over 30 Minutes Intravenous Every 24 hours 11/21/20 1035 11/21/20 2017   11/22/20 0435  vancomycin variable dose per unstable renal function (pharmacist dosing)  Status:  Discontinued         Does not apply See admin instructions 11/22/20 0435 11/23/20 1022   11/21/20 0700  vancomycin (VANCOCIN) IVPB 1000 mg/200 mL premix        1,000 mg 200 mL/hr over 60 Minutes Intravenous  Once 11/21/20 0648 11/21/20 1247   11/21/20 0700  ceFEPIme (MAXIPIME) 2 g in sodium chloride 0.9 % 100 mL IVPB        2 g 200 mL/hr over  30 Minutes Intravenous  Once 11/21/20 0656 11/21/20 1022   11/21/20 0645  metroNIDAZOLE (FLAGYL) IVPB 500 mg        500 mg 100 mL/hr over 60 Minutes Intravenous  Once 11/21/20 0636 11/21/20 0816        Time spent: 45 minutes-Greater than 50% of this time was spent in counseling, explanation of diagnosis, planning of further management, and coordination of care.  MEDICATIONS: Scheduled Meds:  Chlorhexidine Gluconate Cloth  6 each Topical Q0600   collagenase   Topical Daily   insulin aspart  0-6 Units Subcutaneous TID WC   insulin glargine-yfgn  7 Units Subcutaneous Daily   sodium zirconium cyclosilicate  10 g Oral Once   Continuous Infusions:  dextrose Stopped (11/23/20 0930)   insulin Stopped (11/23/20 0930)  vancomycin     PRN Meds:.dextrose, haloperidol lactate, ondansetron (ZOFRAN) IV   PHYSICAL EXAM: Vital signs: Vitals:   11/23/20 1130 11/23/20 1200 11/23/20 1230 11/23/20 1300  BP: (!) 197/101 (!) 184/92 (!) 198/103 (!) 194/100  Pulse: (!) 101 (!) 104 (!) 106 (!) 106  Resp:      Temp:      TempSrc:      SpO2:      Weight:      Height:       Filed Weights   11/21/20 0600 11/23/20 0949  Weight: 60.8 kg 61.1 kg   Body mass index is 21.1 kg/m.   Gen Exam: Encephalopathic-pushes me away-"help me".  Facial swelling continues. HEENT:atraumatic, normocephalic Chest: B/L clear to auscultation anteriorly CVS:S1S2 regular Abdomen:soft non tender, non distended Extremities:no edema Neurology: Difficult exam-in restraints-but seems to be moving all 4 extremities. Skin: no rash   I have personally reviewed following labs and imaging studies  LABORATORY DATA: CBC: Recent Labs  Lab 11/21/20 0550 11/21/20 1227 11/22/20 0714 11/22/20 1221 11/23/20 0141  WBC 10.3 7.8 8.9 6.4 7.9  NEUTROABS 8.6* 6.0  --   --   --   HGB 11.0* 10.2* 9.5* 10.1* 9.6*  HCT 32.8* 30.6* 28.1* 29.6* 28.6*  MCV 89.4 88.4 88.6 86.8 88.8  PLT 246 226 215 208 193     Basic Metabolic  Panel: Recent Labs  Lab 11/22/20 1051 11/22/20 1441 11/22/20 1944 11/22/20 2218 11/23/20 0735  NA 129* 131* 129* 130* 130*  K 5.1 5.4* 5.1 5.1 4.9  CL 94* 93* 94* 94* 95*  CO2 22 15* 14* 18* 16*  GLUCOSE 114* 161* 176* 112* 140*  BUN 69* 74* 76* 78* 79*  CREATININE 9.10* 9.29* 9.69* 9.81* 9.95*  CALCIUM 8.2* 8.1* 8.3* 8.3* 8.2*  PHOS  --   --   --   --  6.0*     GFR: Estimated Creatinine Clearance: 6.6 mL/min (A) (by C-G formula based on SCr of 9.95 mg/dL (H)).  Liver Function Tests: Recent Labs  Lab 11/21/20 0550 11/23/20 0735  AST 28 26  ALT 45* 32  ALKPHOS 242* 156*  BILITOT 0.7 0.9  PROT 8.1 6.6  ALBUMIN 3.9 3.0*  3.1*    Recent Labs  Lab 11/21/20 0550  LIPASE 40    Recent Labs  Lab 11/23/20 0852  AMMONIA 51*    Coagulation Profile: Recent Labs  Lab 11/21/20 0550  INR 1.0     Cardiac Enzymes: No results for input(s): CKTOTAL, CKMB, CKMBINDEX, TROPONINI in the last 168 hours.  BNP (last 3 results) No results for input(s): PROBNP in the last 8760 hours.  Lipid Profile: No results for input(s): CHOL, HDL, LDLCALC, TRIG, CHOLHDL, LDLDIRECT in the last 72 hours.  Thyroid Function Tests: No results for input(s): TSH, T4TOTAL, FREET4, T3FREE, THYROIDAB in the last 72 hours.  Anemia Panel: No results for input(s): VITAMINB12, FOLATE, FERRITIN, TIBC, IRON, RETICCTPCT in the last 72 hours.  Urine analysis:    Component Value Date/Time   COLORURINE YELLOW 11/23/2020 0410   APPEARANCEUR CLEAR 11/23/2020 0410   LABSPEC 1.012 11/23/2020 0410   PHURINE 7.0 11/23/2020 0410   GLUCOSEU >=500 (A) 11/23/2020 0410   GLUCOSEU 500 (A) 01/26/2015 1200   HGBUR NEGATIVE 11/23/2020 0410   BILIRUBINUR NEGATIVE 11/23/2020 0410   KETONESUR 5 (A) 11/23/2020 0410   PROTEINUR 100 (A) 11/23/2020 0410   UROBILINOGEN 0.2 01/26/2015 1200   NITRITE NEGATIVE 11/23/2020 0410   LEUKOCYTESUR NEGATIVE 11/23/2020 0410  Sepsis Labs: Lactic Acid, Venous     Component Value Date/Time   LATICACIDVEN 1.6 11/21/2020 1227    MICROBIOLOGY: Recent Results (from the past 240 hour(s))  Blood culture (routine single)     Status: None (Preliminary result)   Collection Time: 11/21/20  5:50 AM   Specimen: BLOOD  Result Value Ref Range Status   Specimen Description   Final    BLOOD SITE NOT SPECIFIED Performed at [facility] [address], [place], [place] [address]    Special Requests   Final    BOTTLES DRAWN AEROBIC AND ANAEROBIC Blood Culture adequate volume Performed at [facility] [address] [place], [place] [address]    Culture   Final    NO GROWTH 2 DAYS Performed at [facility] [address], [place], [place] [address]    Report Status PENDING  Incomplete  Resp Panel by RT-PCR (Flu A&B, Covid) Nasopharyngeal Swab     Status: None   Collection Time: 11/21/20  8:11 AM   Specimen: Nasopharyngeal Swab; Nasopharyngeal(NP) swabs in vial transport medium  Result Value Ref Range Status   SARS Coronavirus 2 by RT PCR NEGATIVE NEGATIVE Final    Comment: (NOTE) SARS-CoV-2 target nucleic acids are NOT DETECTED.  The SARS-CoV-2 RNA is generally detectable in upper respiratory specimens during the acute phase of infection. The lowest concentration of SARS-CoV-2 viral copies this assay can detect is 138 copies/mL. A negative result does not preclude SARS-Cov-2 infection and should not be used as the sole basis for treatment or other patient management decisions. A negative result may occur with  improper specimen collection/handling, submission of specimen other than nasopharyngeal swab, presence of viral mutation(s) within the areas targeted by this assay, and inadequate number of viral copies(<138 copies/mL). A negative result must be combined with clinical observations, patient history, and epidemiological information. The expected result is Negative.  Fact Sheet for Patients:   EntrepreneurPulse.com.au  Fact Sheet for Healthcare Providers:  IncredibleEmployment.be  This test is no t yet approved or cleared by the [place] FDA and  has been authorized for detection and/or diagnosis of SARS-CoV-2 by FDA under an Emergency Use Authorization (EUA). This EUA will remain  in effect (meaning this test can be used) for the duration of the COVID-19 declaration under Section 564(b)(1) of the Act, 21 U.S.C.section 360bbb-3(b)(1), unless the authorization is terminated  or revoked sooner.       Influenza A by PCR NEGATIVE NEGATIVE Final   Influenza B by PCR NEGATIVE NEGATIVE Final    Comment: (NOTE) The Xpert Xpress SARS-CoV-2/FLU/RSV plus assay is intended as an aid in the diagnosis of influenza from Nasopharyngeal swab specimens and should not be used as a sole basis for treatment. Nasal washings and aspirates are unacceptable for Xpert Xpress SARS-CoV-2/FLU/RSV testing.  Fact Sheet for Patients: EntrepreneurPulse.com.au  Fact Sheet for Healthcare Providers: IncredibleEmployment.be  This test is not yet approved or cleared by the [place] FDA and has been authorized for detection and/or diagnosis of SARS-CoV-2 by FDA under an Emergency Use Authorization (EUA). This EUA will remain in effect (meaning this test can be used) for the duration of the COVID-19 declaration under Section 564(b)(1) of the Act, 21 U.S.C. section 360bbb-3(b)(1), unless the authorization is terminated or revoked.  Performed at [facility], [place] [address], [place], [place] [address]   Culture, blood (single)     Status: None (Preliminary result)   Collection Time: 11/21/20  3:45  PM   Specimen: BLOOD RIGHT HAND  Result Value Ref Range Status   Specimen Description BLOOD RIGHT HAND  Final   Special Requests IN PEDIATRIC BOTTLE Blood Culture adequate volume  Final   Culture   Final     NO GROWTH 2 DAYS Performed at [facility], [address] [place], [place] [address]    Report Status PENDING  Incomplete  MRSA Next Gen by PCR, Nasal     Status: Abnormal   Collection Time: 11/22/20 10:57 AM   Specimen: Nasal Mucosa; Nasal Swab  Result Value Ref Range Status   MRSA by PCR Next Gen DETECTED (A) NOT DETECTED Final    Comment: RESULT CALLED TO, READ BACK BY AND VERIFIED WITH: Maryan Puls RN 11/22/2020 @1417  BY JW (NOTE) The GeneXpert MRSA Assay (FDA approved for NASAL specimens only), is one component of a comprehensive MRSA colonization surveillance program. It is not intended to diagnose MRSA infection nor to guide or monitor treatment for MRSA infections. Test performance is not FDA approved in patients less than 30 years old. Performed at [facility], [place] [address], [place], [place] [address]   C Difficile Quick Screen w PCR reflex     Status: None   Collection Time: 11/22/20  4:33 PM   Specimen: STOOL  Result Value Ref Range Status   C Diff antigen NEGATIVE NEGATIVE Final   C Diff toxin NEGATIVE NEGATIVE Final   C Diff interpretation No C. difficile detected.  Final    Comment: Performed at [facility], [place] [address] [place], [place] [address]  Gastrointestinal Panel by PCR , Stool     Status: None   Collection Time: 11/22/20  4:33 PM   Specimen: STOOL  Result Value Ref Range Status   Campylobacter species NOT DETECTED NOT DETECTED Final   Plesimonas shigelloides NOT DETECTED NOT DETECTED Final   Salmonella species NOT DETECTED NOT DETECTED Final   Yersinia enterocolitica NOT DETECTED NOT DETECTED Final   Vibrio species NOT DETECTED NOT DETECTED Final   Vibrio cholerae NOT DETECTED NOT DETECTED Final   Enteroaggregative E coli (EAEC) NOT DETECTED NOT DETECTED Final   Enteropathogenic E coli (EPEC) NOT DETECTED NOT DETECTED Final   Enterotoxigenic E coli (ETEC) NOT DETECTED NOT DETECTED Final   Shiga like toxin producing E coli  (STEC) NOT DETECTED NOT DETECTED Final   Shigella/Enteroinvasive E coli (EIEC) NOT DETECTED NOT DETECTED Final   Cryptosporidium NOT DETECTED NOT DETECTED Final   Cyclospora cayetanensis NOT DETECTED NOT DETECTED Final   Entamoeba histolytica NOT DETECTED NOT DETECTED Final   Giardia lamblia NOT DETECTED NOT DETECTED Final   Adenovirus F40/41 NOT DETECTED NOT DETECTED Final   Astrovirus NOT DETECTED NOT DETECTED Final   Norovirus GI/GII NOT DETECTED NOT DETECTED Final   Rotavirus A NOT DETECTED NOT DETECTED Final   Sapovirus (I, II, IV, and V) NOT DETECTED NOT DETECTED Final    Comment: Performed at [facility], [address] [place], [place] [address]    RADIOLOGY STUDIES/RESULTS: DG Foot Complete Right  Result Date: 11/22/2020 CLINICAL DATA:  Wound, discharge EXAM: RIGHT FOOT COMPLETE - 3+ VIEW COMPARISON:  07/04/2020 FINDINGS: Frontal, oblique, lateral views of the right foot are obtained. 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 Extensive degenerative changes are seen throughout the hindfoot, unchanged since prior exam. There are no acute displaced fractures. The bones are diffusely osteopenic. No destructive changes or periosteal reaction  to suggest osteomyelitis. There is mild diffuse soft tissue edema, most pronounced along the plantar medial aspect at the amputation site. No evidence of subcutaneous gas or radiopaque foreign body. Overlying bandage material is seen along the medial right foot. IMPRESSION: 1. Extensive postsurgical and degenerative changes as above, stable since previous study. 2. No acute or destructive bony lesions. 3. Prominent soft tissue swelling along the plantar medial aspect of the right foot. Electronically Signed   By: Randa Ngo M.D.   On: 11/22/2020 15:14     LOS: 2 days   Oren Binet, MD  [facility]    To contact the attending provider between 7A-7P or the covering provider during after  hours 7P-7A, please log into the web site [email or web address] and access using universal [place] password for that web site. If you do not have the password, please call the hospital operator.  11/23/2020, 2:23 PM

## 2020-11-23 NOTE — Progress Notes (Signed)
Pt transferred to CT via bed. 2mg  IM haldol given for patient agitation. Wrist restraints remain intact.

## 2020-11-23 NOTE — Progress Notes (Signed)
Patient going to CT at this time - EEG will be done in the morning per Dr. Sloan Leiter due to dialysis affects on EEG.

## 2020-11-23 NOTE — Progress Notes (Signed)
EEG called nurse patient going to start dialysis. Will try after. As well as after she has received Haldol.

## 2020-11-23 NOTE — Progress Notes (Addendum)
[facility]  Date of Admission:  11/21/2020           Reason for visit: Follow up on osteomyelitis  Current antibiotics: Vanc 8/22-pres Cefepime 8/22-pres    ASSESSMENT:    50 y.o. female admitted with:  MRSA bacteremia: She was seen at urgent care in [place] on 10/13/20 for symptoms of nausea and vomiting in setting of toe cellulitis of left foot.  Sent to the ED from there where she was noted to have left 3rd toe with purulence and erythema up to her midfoot.  Plain film at that time was negative for OM.  CRP 104.8, ESR 95 that day.  She was sent out with doxycycline x 5 days.  Her blood cultures x 2 that day were both positive for MRSA.  She declined admission and was seen at [facility] on 10/20/20 who arranged vancomycin with HD.  However, she only received from 7/26-8/4.  No further work up was done or blood cultures repeated. Metabolic encephalopathy: This may be due to DKA/need for HD, however, certainly concern for CNS emboli due to #1.  DKA with hx of DM type 1 Osteomyelitis of 2nd left phalanx: seen by podiatry yesterday and has been following with wound care outpatient.  X-ray in July with no findings of bone destruction, however, x-ray this admit with findings c/w osteomyelitis at 2nd phalanges. PAD: With previous history of stent placement per notes. ESRD: for HD today Previous right TMA Diarrhea: C diff and GI panel negative  RECOMMENDATIONS:    Stop cefepime Continue with vancomycin per pharmacy.  We are trying to track down her antibiotic history with HD Repeat blood cultures again to ensure no bacteremia TTE.  If negative, would likely plan for TEE MRI brain when she is more cooperative to eval for CNS emboli MRI left foot to further evaluate when more cooperative ESR, CRP Wound care, glycemic control Will follow   Principal Problem:   DKA, type 1 ([place]) Active Problems:   Hypertension   Hypercholesteremia   Osteomyelitis  ([place])   Severe sepsis ([place])   ESRD (end stage renal disease) on dialysis ([place])   Acute metabolic encephalopathy   Hypertensive urgency   DKA (diabetic ketoacidosis) ([place])    MEDICATIONS:    Scheduled Meds:  Chlorhexidine Gluconate Cloth  6 each Topical Q0600   collagenase   Topical Daily   insulin aspart  0-6 Units Subcutaneous TID WC   insulin glargine-yfgn  7 Units Subcutaneous Daily   sodium zirconium cyclosilicate  10 g Oral Once   Continuous Infusions:  ceFEPime (MAXIPIME) IV Stopped (11/22/20 1854)   ceFEPime (MAXIPIME) IV     dextrose Stopped (11/23/20 0930)   insulin Stopped (11/23/20 0930)   vancomycin     vancomycin     PRN Meds:.dextrose, haloperidol lactate, ondansetron (ZOFRAN) IV  SUBJECTIVE:   24 hour events:  Continued agitation requiring Haldol and soft restraints Seen by podiatry Afebrile, Tmax 99.6 WBC 7.9 HD planned for today Blood cx NGTD  Patient lying in bed, remains agitated and encephalopathic.  Stating "Please help me".  Review of Systems  Unable to perform ROS: Mental status change     OBJECTIVE:   Blood pressure (!) 191/101, pulse 100, temperature 97.9 F (36.6 C), temperature source Axillary, resp. rate 20, height $RemoveBe'5\' 7"'hOIpnaMCq$  (1.702 m), weight 61.1 kg, SpO2 97 %. Body mass index is 21.1 kg/m.  Physical Exam Constitutional:      Comments: Lying in  bed, encephalopathic.  HENT:     Head: Normocephalic and atraumatic.  Pulmonary:     Effort: Pulmonary effort is normal. No respiratory distress.  Abdominal:     General: There is no distension.     Palpations: Abdomen is soft.     Tenderness: There is no abdominal tenderness.  Musculoskeletal:     Cervical back: Normal range of motion and neck supple.     Comments: S/p Right TMA Left foot with chronic ulcer, no acute changes.   Skin:    General: Skin is warm and dry.     Findings: No rash.  Neurological:     Mental Status: She is disoriented.     Cranial Nerves: No cranial nerve  deficit.     Comments: In soft restraints.     Lab Results: Lab Results  Component Value Date   WBC 7.9 11/23/2020   HGB 9.6 (L) 11/23/2020   HCT 28.6 (L) 11/23/2020   MCV 88.8 11/23/2020   PLT 193 11/23/2020    Lab Results  Component Value Date   NA 130 (L) 11/23/2020   K 4.9 11/23/2020   CO2 16 (L) 11/23/2020   GLUCOSE 140 (H) 11/23/2020   BUN 79 (H) 11/23/2020   CREATININE 9.95 (H) 11/23/2020   CALCIUM 8.2 (L) 11/23/2020   GFRNONAA 4 (L) 11/23/2020   GFRAA 16 (L) 08/08/2018    Lab Results  Component Value Date   ALT 32 11/23/2020   AST 26 11/23/2020   ALKPHOS 156 (H) 11/23/2020   BILITOT 0.9 11/23/2020       Component Value Date/Time   CRP 9.9 (H) 11/10/2018 1126   CRP 17.3 (H) 12/18/2016 1544       Component Value Date/Time   ESRSEDRATE 56 (H) 11/10/2018 1126     I have reviewed the micro and lab results in Epic.  Imaging: DG Foot Complete Right  Result Date: 11/22/2020 CLINICAL DATA:  Wound, discharge EXAM: RIGHT FOOT COMPLETE - 3+ VIEW COMPARISON:  07/04/2020 FINDINGS: Frontal, oblique, lateral views of the right foot are obtained. 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 Extensive degenerative changes are seen throughout the hindfoot, unchanged since prior exam. There are no acute displaced fractures. The bones are diffusely osteopenic. No destructive changes or periosteal reaction to suggest osteomyelitis. There is mild diffuse soft tissue edema, most pronounced along the plantar medial aspect at the amputation site. No evidence of subcutaneous gas or radiopaque foreign body. Overlying bandage material is seen along the medial right foot. IMPRESSION: 1. Extensive postsurgical and degenerative changes as above, stable since previous study. 2. No acute or destructive bony lesions. 3. Prominent soft tissue swelling along the plantar medial aspect of the right foot. Electronically Signed   By: Randa Ngo M.D.   On:  11/22/2020 15:14     Imaging independently reviewed in Epic.    [facility] [phone] pager 11/23/2020, 11:00 AM  I spent greater than 35 minutes with the patient including greater than 50% of time in face to face counsel of the patient and in coordination of their care.

## 2020-11-24 ENCOUNTER — Inpatient Hospital Stay (HOSPITAL_COMMUNITY): Payer: Medicare HMO

## 2020-11-24 ENCOUNTER — Ambulatory Visit: Payer: Medicare HMO | Admitting: Endocrinology

## 2020-11-24 DIAGNOSIS — R4182 Altered mental status, unspecified: Secondary | ICD-10-CM

## 2020-11-24 DIAGNOSIS — N186 End stage renal disease: Secondary | ICD-10-CM | POA: Diagnosis not present

## 2020-11-24 DIAGNOSIS — R7881 Bacteremia: Secondary | ICD-10-CM

## 2020-11-24 DIAGNOSIS — E1010 Type 1 diabetes mellitus with ketoacidosis without coma: Secondary | ICD-10-CM | POA: Diagnosis not present

## 2020-11-24 DIAGNOSIS — H539 Unspecified visual disturbance: Secondary | ICD-10-CM

## 2020-11-24 DIAGNOSIS — H547 Unspecified visual loss: Secondary | ICD-10-CM

## 2020-11-24 LAB — CBC
HCT: 32.8 % — ABNORMAL LOW (ref 36.0–46.0)
Hemoglobin: 10.6 g/dL — ABNORMAL LOW (ref 12.0–15.0)
MCH: 29.7 pg (ref 26.0–34.0)
MCHC: 32.3 g/dL (ref 30.0–36.0)
MCV: 91.9 fL (ref 80.0–100.0)
Platelets: 230 10*3/uL (ref 150–400)
RBC: 3.57 MIL/uL — ABNORMAL LOW (ref 3.87–5.11)
RDW: 14.2 % (ref 11.5–15.5)
WBC: 7.5 10*3/uL (ref 4.0–10.5)
nRBC: 0 % (ref 0.0–0.2)

## 2020-11-24 LAB — RENAL FUNCTION PANEL
Albumin: 3 g/dL — ABNORMAL LOW (ref 3.5–5.0)
Anion gap: 21 — ABNORMAL HIGH (ref 5–15)
BUN: 31 mg/dL — ABNORMAL HIGH (ref 6–20)
CO2: 16 mmol/L — ABNORMAL LOW (ref 22–32)
Calcium: 8.4 mg/dL — ABNORMAL LOW (ref 8.9–10.3)
Chloride: 99 mmol/L (ref 98–111)
Creatinine, Ser: 6.35 mg/dL — ABNORMAL HIGH (ref 0.44–1.00)
GFR, Estimated: 8 mL/min — ABNORMAL LOW (ref >60)
Glucose, Bld: 160 mg/dL — ABNORMAL HIGH (ref 70–99)
Phosphorus: 4.8 mg/dL — ABNORMAL HIGH (ref 2.5–4.6)
Potassium: 3.7 mmol/L (ref 3.5–5.1)
Sodium: 136 mmol/L (ref 135–145)

## 2020-11-24 LAB — ECHOCARDIOGRAM COMPLETE
Height: 67 in
S' Lateral: 2.7 cm
Weight: 2031.76 oz

## 2020-11-24 LAB — GLUCOSE, CAPILLARY
Glucose-Capillary: 193 mg/dL — ABNORMAL HIGH (ref 70–99)
Glucose-Capillary: 256 mg/dL — ABNORMAL HIGH (ref 70–99)
Glucose-Capillary: 122 mg/dL — ABNORMAL HIGH (ref 70–99)
Glucose-Capillary: 127 mg/dL — ABNORMAL HIGH (ref 70–99)

## 2020-11-24 LAB — HEPATITIS B SURFACE ANTIBODY, QUANTITATIVE: Hep B S AB Quant (Post): 74.8 m[IU]/mL (ref >9.9)

## 2020-11-24 MED ORDER — LORAZEPAM 2 MG/ML IJ SOLN
2.0000 mg | INTRAMUSCULAR | Status: AC
  Administered 2020-11-24: 2 mg via INTRAVENOUS
  Filled 2020-11-24: qty 1

## 2020-11-24 MED ORDER — LORAZEPAM 2 MG/ML IJ SOLN
1.0000 mg | Freq: Once | INTRAMUSCULAR | Status: AC | PRN
  Administered 2020-11-24: 1 mg via INTRAVENOUS
  Filled 2020-11-24 (×2): qty 1

## 2020-11-24 NOTE — Consult Note (Signed)
CC:  Chief Complaint  Patient presents with   Hyperglycemia    HPI: [name] is a 50 y.o. female w/ POH of Diabetic Retinopathy OU (follows with Dr. Baird Cancer s/p likely PRP and intravitreal injections OU) and PMH below who presents for evaluation of sudden onset of bilateral vision loss upon awakening after being encephalopathic. Patient is currently sedated, and I am unable to obtain a first-person history. Per review of notes, she noted bilateral vision loss, without pain. I was consulted to evaluate for ocular etiology of vision loss.   EEG was done which so far did show concern for seizure activity, and a video EEG is currently undergoing.   MRI Brain showed no acute abnormality.   ROS: Denies fever/chills, unintentional weight loss, chest pain, irregular heart rhythm, SOB, cough, wheezing, abdominal pain, melena, hematochezia, weakness, numbness, slurring of speech, facial droop, muscle weakness, joint pain, skin rash, tattoos, depressed mood  PMH: Past Medical History:  Diagnosis Date   Anemia    Arthritis    Bronchitis    C. difficile diarrhea 09/26/2014   Cataracts, both eyes    had surgery to remove   Cellulitis of right foot 06/02/2014   CKD (chronic kidney disease) stage 3, GFR 30-59 ml/min (HCC)    Dialysis T, TH, Sat   Diabetic ulcer of right foot ([place]) 06/02/2014   DVT (deep venous thrombosis) ([place]) 10/2014   one in each one in leg- PICC line , one in right and left arm   H/O seasonal allergies    History of blood transfusion    Hypercholesteremia    Hypertension    Left foot infection    Neuromuscular disorder (HCC)    lower legs and feet   Neuropathy in diabetes [facility])    Peripheral vascular disease (HCC)    Sleep apnea    does not use cpap   Staphylococcus aureus bacteremia 10/2014   Type 1 diabetes ([place])    onset age 55    PSH: Past Surgical History:  Procedure Laterality Date   AMPUTATION TOE Left 07/24/2015   5th toe and Hallux amputation   AV  FISTULA PLACEMENT Left 08/06/2018   Procedure: INSERTION OF BASCILIC VEN TRANSPOSITION 1ST STAGE;  Surgeon: Rosetta Posner, MD;  Location: [facility];  Service: Vascular;  Laterality: Left;   [place] Left 04/29/2019   Procedure: SECOND STAGE BASCILIC VEIN TRANSPOSITION LEFT ARM;  Surgeon: Rosetta Posner, MD;  Location: [facility];  Service: Vascular;  Laterality: Left;   CESAREAN SECTION     x 1   EXOSTECTECTOMY TOE Left 04/11/2016   Procedure: EXOSTECTECTOMY CUBOID AND 5TH METATARSAL AND PLACEMENT OF ANTIBIOTIC BEADS;  Surgeon: Edrick Kins, DPM;  Location: [facility];  Service: Podiatry;  Laterality: Left;   EYE SURGERY Bilateral    removed cataracts - laser   FEMORAL-POPLITEAL BYPASS GRAFT Left 05/03/2015   FOOT AMPUTATION THROUGH METATARSAL Right 2016   hemrrhoidectomy     I & D EXTREMITY Right 06/10/2014   Procedure: IRRIGATION AND DEBRIDEMENT Right Foot;  Surgeon: Newt Minion, MD;  Location: [facility];  Service: Orthopedics;  Laterality: Right;   I & D EXTREMITY Right 07/31/2018   Procedure: IRRIGATION AND DEBRIDEMENT EXTREMITY;  Surgeon: Edrick Kins, DPM;  Location: [facility];  Service: Podiatry;  Laterality: Right;   I & D EXTREMITY Right 08/04/2018   Procedure: IRRIGATION AND DEBRIDEMENT FOOT with placement of wound vac;  Surgeon: Edrick Kins, DPM;  Location: [facility];  Service: Podiatry;  Laterality: Right;   INCISION AND DRAINAGE Left 04/11/2016   Procedure: INCISION AND DRAINAGE A DEEP COMPLICATED WOUND OF LEFT FOOT;  Surgeon: Edrick Kins, DPM;  Location: [facility];  Service: Podiatry;  Laterality: Left;   INSERTION OF DIALYSIS CATHETER Right 08/06/2018   Procedure: INSERTION OF TUNNELED DIALYSIS CATHETER;  Surgeon: Rosetta Posner, MD;  [facility];  Service: Vascular;  Laterality: Right;   IR FLUORO GUIDE CV LINE RIGHT  09/24/2017   IR FLUORO GUIDE CV LINE RIGHT  08/01/2018   IR REMOVAL TUN CV CATH W/O FL  11/22/2017   IR US GUIDE VASC ACCESS RIGHT  09/24/2017   IR US GUIDE VASC ACCESS  RIGHT  08/01/2018   IRRIGATION AND DEBRIDEMENT ABSCESS Left 05/06/2017   Procedure: IRRIGATION AND DEBRIDEMENT ABSCESS LEFT FOOT;  Surgeon: Edrick Kins, DPM;  Location: [facility];  Service: Podiatry;  Laterality: Left;   PERIPHERAL VASCULAR BALLOON ANGIOPLASTY Left 01/20/2020   Procedure: PERIPHERAL VASCULAR BALLOON ANGIOPLASTY;  Surgeon: Marty Heck, MD;  Location: [facility];  Service: Cardiovascular;  Laterality: Left;  Arm fistula   PERIPHERALLY INSERTED CENTRAL CATHETER INSERTION     SKIN GRAFT Right 06/15/2014   SKIN GRAFT Left 06/2015   foot    SKIN SPLIT GRAFT Right 06/15/2014   Procedure: SPLIT THICKNESS SKIN GRAFT RIGHT FOOT;  Surgeon: Newt Minion, MD;  Location: [facility];  Service: Orthopedics;  Laterality: Right;   TRANSMETATARSAL AMPUTATION Right    TRANSMETATARSAL AMPUTATION Right 09/11/2014   WISDOM TOOTH EXTRACTION      Meds: No current facility-administered medications on file prior to encounter.   Current Outpatient Medications on File Prior to Encounter  Medication Sig Dispense Refill   albuterol (PROVENTIL HFA;VENTOLIN HFA) 108 (90 BASE) MCG/ACT inhaler Inhale 2 puffs into the lungs every 6 (six) hours as needed for wheezing or shortness of breath.      amLODipine (NORVASC) 5 MG tablet Take 5 mg by mouth daily.     aspirin EC 81 MG tablet Take 81 mg by mouth daily.     B Complex-C-Zn-Folic Acid (DIALYVITE 073 WITH ZINC) 0.8 MG TABS Take 1 tablet by mouth daily.     calcitRIOL (ROCALTROL) 0.5 MCG capsule Take 1 capsule daily 90 capsule 2   calcium acetate (PHOSLO) 667 MG capsule Take 2 capsules (1,334 mg total) by mouth 3 (three) times daily with meals. 180 capsule 0   Continuous Blood Gluc Receiver ([place]) DEVI 1 each by Does not apply route See admin instructions. Use Dexcom Receiver to monitor blood sugar continuously. 1 each 0   Continuous Blood Gluc Sensor (DEXCOM G6 SENSOR) MISC 1 each by Does not apply route See admin instructions. Use one  sensors once every 10 days to monitor blood sugars. 9 each 3   Continuous Blood Gluc Transmit (DEXCOM G6 TRANSMITTER) MISC 1 each by Does not apply route See admin instructions. Use one transmitter once every 90 days. 1 each 3   CONTOUR NEXT TEST test strip USE AS INSTRUCTED TO CHECK BLOOD SUGAR 4 TIMES A DAY. DX E10.65 100 strip 4   Cyanocobalamin (B-12) 5000 MCG CAPS Take 5,000 mcg by mouth daily.     diphenhydrAMINE (BENADRYL) 25 MG tablet Take 25 mg by mouth daily as needed.      folic acid (FOLVITE) 710 MCG tablet Take 400 mcg by mouth daily.     heparin 1000 unit/ml SOLN injection Heparin Sodium (Porcine) 1,000 Units/mL Catheter  Lock Arterial     insulin aspart (NOVOLOG) 100 UNIT/ML injection USE A MAX OF 65 UNITS DAILY VIA INSULIN PUMP 10 mL 2   Insulin Human (INSULIN PUMP) SOLN Inject into the skin. Insulin aspart (Novolog) 100 unit/ml---MAX 65 UNITS DAILY     lidocaine-prilocaine (EMLA) cream Apply 1 application topically daily as needed. Port access     norethindrone (AYGESTIN) 5 MG tablet Take 5 mg by mouth See admin instructions. 5mg  daily for 14 days, then off for 14 days, then repeat cycle.     Omega-3 Fatty Acids (OMEGA 3 500) 500 MG CAPS Take 500 mg by mouth daily.     ondansetron (ZOFRAN ODT) 4 MG disintegrating tablet Take 1 tablet (4 mg total) by mouth every 8 (eight) hours as needed for nausea or vomiting. 20 tablet 0   pravastatin (PRAVACHOL) 40 MG tablet Take 1 tablet (40 mg total) by mouth daily. 90 tablet 1    SH: Social History   Socioeconomic History   Marital status: Single    Spouse name: Not on file   Number of children: Not on file   Years of education: Not on file   Highest education level: Not on file  Occupational History   Occupation: disabled  Tobacco Use   Smoking status: Former    Years: 15.00    Types: Cigarettes    Quit date: 05/16/2008    Years since quitting: 12.5   Smokeless tobacco: Never  Vaping Use   Vaping Use: Never used  Substance and  Sexual Activity   Alcohol use: Not Currently    Comment: Occasional   Drug use: No   Sexual activity: Yes    Birth control/protection: Pill  Other Topics Concern   Not on file  Social History Narrative   Lives two roommates.     Social Determinants of Health   Financial Resource Strain: Not on file  Food Insecurity: Not on file  Transportation Needs: Not on file  Physical Activity: Not on file  Stress: Not on file  Social Connections: Not on file    FH: Family History  Problem Relation Age of Onset   Hyperlipidemia Mother    Dementia Mother     Exam:  Lucianne Lei: OD: Unable (pupil is 3 -2.37mm) OS: Unable (pupil is 3-2.82mm)  CVF: OD: Unable to assess OS: Unable to assess  EOM: OD: Unable to assess OS: Unable to assess  Pupils: OD: 3->2.5 mm, sluggish, no APD OS: 3->2.5 mm, sluggish, no APD  IOP: by [place] OD: 19 OS: 21  External: OD: no periorbital edema, no proptosis,  OS: no periorbital edema, no proptosis,    Pen Light Exam: L/L: OD: WNL OS: WNL  C/S: OD: white and quiet OS: white and quiet  K: OD: 2+PEE OS: 1+ PEE  A/C: OD: grossly deep and quiet appearing by pen light OS: grossly deep and quiet appearing by pen light  I: OD: round, irregular at the pupillary margin OS: round, irregular at the pupillary margin  L: OD: PCIOL OS: PCIOL  DFE: dilated @ 4:50 PM w/ [place] and Phenyl OU  V: OD: clear OS: clear  N: OD: C/D 0.1, FVP off of nerve, no heme or disc edema OS: C/D 0.1, FVP off of nerve, no heme or disc edema  M: OD: flat, no obvious macular pathology, few MAs, no cherry red spot OS: flat, no obvious macular pathology, few MAs, no cherry red spot  V: OD: Attenuated vessels, no emboli seen OS: Attenuated  vessels, no emboli seen  P: OD: retina flat 360, dense PRP, no masses, no retinal tear/detachment OS: retina flat 360, dense PRP, no masses, no retinal tear/detachment  MRI Brain - Shows no acute intracranial  abnormality - Pending longer EEG from neurology  A/P:  1. Bilateral Vision Loss - Patient is sedated, and I am unable to obtain a vision - Ocular examination shows no acute abnormality; she has dense panretinal photocoagulation (PRP) in the peripheral retina. I do not see evidence for a cherry red spot for a CRAO or evidence of BRAO. - I see no vitreous debris, there is no vitreous hemorrhage, and no evidence for endogenous endophthalmitis.   - I see no evidence for ocular cause for vision loss. It could be BP fluctuations, blood sugar fluctuations, possibly macular edema that is unable to be appreciated bedside, secondary to seizures, or non-organic.  - I would recommend follow-up outpatient with her retina specialists.  2. Diabetic Retinopathy OU - Examination shows dense PRP, no vitreous hemorrhage, no appreciable macular edema with 28D lens.  - Recommend follow-up with retina after discharge  Marshall Cork, MD,[facility] Ophthalmology

## 2020-11-24 NOTE — Care Management Important Message (Signed)
Important Message  Patient Details  Name: [name] MRN: [id number] Date of Birth: 09-02-1970   Medicare Important Message Given:  Yes  Patient left prior to IM delivery will mail to the patient home address    Orbie Pyo 11/24/2020, 3:40 PM

## 2020-11-24 NOTE — Progress Notes (Signed)
Pt with complaints of "I can't see anything" can only see a "bright light" Ghimire MD aware and assessed pt. MRI order changed to STAT. EEG at bedside per MD ok to go after EEG. 1mg  ativan ordered for MRI.

## 2020-11-24 NOTE — Progress Notes (Signed)
LTM started; no initial skin breakdown was seen; event button tested. Nurse educated. [facility] notified.

## 2020-11-24 NOTE — Progress Notes (Signed)
Stat 2mg  IV ativan ordered for patient actively seizing.

## 2020-11-24 NOTE — Progress Notes (Signed)
Pt arousable and answering questions for nephrology PA

## 2020-11-24 NOTE — Progress Notes (Signed)
  Echocardiogram 2D Echocardiogram has been performed.  Fidel Levy 11/24/2020, 3:13 PM

## 2020-11-24 NOTE — Progress Notes (Signed)
Pt off floor to MRI with  charge nurse

## 2020-11-24 NOTE — Progress Notes (Addendum)
Subjective: Slightly more alert does not remember dialysis yesterday 4.7 L UF, currently getting EEG at bedside.  Where she is at [facility] but has visual loss in both eyes  Objective Vital signs in last 24 hours: Vitals:   11/24/20 0025 11/24/20 0400 11/24/20 0737 11/24/20 1158  BP: (!) 157/71 (!) 150/70 (!) 149/67 (!) 145/67  Pulse: 100 99 100 89  Resp: 18 18 20 16   Temp: 99.1 F (37.3 C) 99 F (37.2 C) 98.7 F (37.1 C) 98.4 F (36.9 C)  TempSrc: Oral Oral Axillary Axillary  SpO2: 98% 97%  97%  Weight:      Height:       Weight change:   Physical Exam: General= thin chronically ill female NAD, more alert than yesterday response to voice moves extremities to request Heart: RRR, no mrg Lungs: CTA  Bilat. Non labored  breathing  Abdomen: Soft , NABS, NT, ND Extremities: trace pedal edema  bilat,, SP R TMA Dialysis Access: LUA AVF positive bruit   Home meds: asa, phoslo 2 ac , insulin pump, pravachol, prn's    OP HD: AF TTS   4h  56kg  2/2 bath Hep none LUA AVF   - hect 9 ug IV tiw     Problem/Plan DKA / type 1 DM -  stabilizing plans per admit and . Please do not use LR or NS fluids without renal service approval.  OK to use straight D5W or D10W for BS support as needed.  Hyponatremia/ hyperkalemia resolved- NA 136, K 3.7  improved with glucose improved , also HD   ESRD - on HD TTS.  HD 8/24 Wednesday OFF schedule 2/2 more urgent pts and low HD staffing  / fu labs and vol /Next HD Saturday on schedule BP/ volume - BP's wnl, HD yesterday 4.7 L UF with volume improved to 1 kg above dry weight if true post weight AMS visual loss- ?due to #1,  infection,  seizure =admit team/neuro working up L foot osteomyelitis -ID team work-up on IV vanc/visualized/ cefepime Nonepileptic status epilepticus=/admit work-up Anemia ckd - Hb 11,>9.6 > 10.6? 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 Vit d on hd corec ca  ok, Phos 4.8  Ernest Haber, PA-C [facility] [phone] 11/24/2020,1:13 PM  LOS: 3 days   Labs: Basic Metabolic Panel: Recent Labs  Lab 11/22/20 2218 11/23/20 0735 11/24/20 0200  NA 130* 130* 136  K 5.1 4.9 3.7  CL 94* 95* 99  CO2 18* 16* 16*  GLUCOSE 112* 140* 160*  BUN 78* 79* 31*  CREATININE 9.81* 9.95* 6.35*  CALCIUM 8.3* 8.2* 8.4*  PHOS  --  6.0* 4.8*   Liver Function Tests: Recent Labs  Lab 11/21/20 0550 11/23/20 0735 11/24/20 0200  AST 28 26  --   ALT 45* 32  --   ALKPHOS 242* 156*  --   BILITOT 0.7 0.9  --   PROT 8.1 6.6  --   ALBUMIN 3.9 3.0*  3.1* 3.0*   Recent Labs  Lab 11/21/20 0550  LIPASE 40   Recent Labs  Lab 11/23/20 0852  AMMONIA 51*   CBC: Recent Labs  Lab 11/21/20 0550 11/21/20 1227 11/22/20 0714 11/22/20 1221 11/23/20 0141 11/24/20 0200  WBC 10.3 7.8 8.9 6.4 7.9 7.5  NEUTROABS 8.6* 6.0  --   --   --   --   HGB 11.0* 10.2* 9.5*  10.1* 9.6* 10.6*  HCT 32.8* 30.6* 28.1* 29.6* 28.6* 32.8*  MCV 89.4 88.4 88.6 86.8 88.8 91.9  PLT 246 226 215 208 193 230   Cardiac Enzymes: No results for input(s): CKTOTAL, CKMB, CKMBINDEX, TROPONINI in the last 168 hours. CBG: Recent Labs  Lab 11/23/20 1653 11/23/20 2034 11/23/20 2305 11/24/20 0736 11/24/20 1157  GLUCAP 181* 115* 145* 193* 256*    Studies/Results: DG Abd 1 View  Result Date: 11/24/2020 CLINICAL DATA:  Evaluate for metallic foreign body EXAM: ABDOMEN - 1 VIEW COMPARISON:  None. FINDINGS: Scattered large and small bowel gas is noted. No abnormal mass or abnormal calcifications are seen. Belly button ring is noted. No bony abnormality is noted. Scattered vascular calcifications are noted. IMPRESSION: Umbilical ring is noted. No other focal abnormality is noted. Electronically Signed   By: Inez Catalina M.D.   On: 11/24/2020 03:45   MR BRAIN WO CONTRAST  Result Date: 11/24/2020 CLINICAL DATA:  Altered mental status, metabolic encephalopathy EXAM: MRI HEAD  WITHOUT CONTRAST TECHNIQUE: Multiplanar, multiecho pulse sequences of the brain and surrounding structures were obtained without intravenous contrast. COMPARISON:  CT head 11/21/2020 FINDINGS: Brain: There is no evidence of acute intracranial hemorrhage, extra-axial fluid collection, or infarct. There is no focal parenchymal signal abnormality. There is mild global parenchymal volume loss. The ventricles are enlarged. No mass lesion is identified. There is no midline shift. Vascular: Normal flow voids. Skull and upper cervical spine: Normal marrow signal. Sinuses/Orbits: The paranasal sinuses are clear. Bilateral lens implants are in place. The bones and orbits are otherwise unremarkable. Other: There are bilateral mastoid effusions. IMPRESSION: 1. No acute intracranial pathology. 2. Bilateral mastoid effusions. Electronically Signed   By: Valetta Mole M.D.   On: 11/24/2020 11:52   CT MAXILLOFACIAL W CONTRAST  Result Date: 11/23/2020 CLINICAL DATA:  Facial cellulitis EXAM: CT MAXILLOFACIAL WITH CONTRAST TECHNIQUE: Multidetector CT imaging of the maxillofacial structures was performed with intravenous contrast. Multiplanar CT image reconstructions were also generated. CONTRAST:  27mL OMNIPAQUE IOHEXOL 300 MG/ML  SOLN COMPARISON:  CT head 11/21/2020 FINDINGS: Osseous: Periapical lucency around left lower molar compatible with infection which may be chronic. No fracture. Orbits: Bilateral cataract extraction.  No orbital mass or edema. Sinuses: Mucosal edema right maxillary sinus. Remaining sinuses clear. Mastoid clear bilaterally. Soft tissues: Mild soft tissue edema in the subcutaneous tissue of the anterior neck and lateral to the mandible bilaterally. No soft tissue abscess identified. Limited intracranial: No acute abnormality IMPRESSION: Mild soft tissue swelling in the anterior neck bilaterally, nonspecific in appearance. Possible edema or cellulitis. No soft tissue abscess. Periapical lucency around left  lower molar consistent with dental infection Electronically Signed   By: Franchot Gallo M.D.   On: 11/23/2020 17:14   DG Foot Complete Right  Result Date: 11/22/2020 CLINICAL DATA:  Wound, discharge EXAM: RIGHT FOOT COMPLETE - 3+ VIEW COMPARISON:  07/04/2020 FINDINGS: Frontal, oblique, lateral views of the right foot are obtained. 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 Extensive degenerative changes are seen throughout the hindfoot, unchanged since prior exam. There are no acute displaced fractures. The bones are diffusely osteopenic. No destructive changes or periosteal reaction to suggest osteomyelitis. There is mild diffuse soft tissue edema, most pronounced along the plantar medial aspect at the amputation site. No evidence of subcutaneous gas or radiopaque foreign body. Overlying bandage material is seen along the medial right foot. IMPRESSION: 1. Extensive postsurgical and degenerative changes as above, stable since previous study.  2. No acute or destructive bony lesions. 3. Prominent soft tissue swelling along the plantar medial aspect of the right foot. Electronically Signed   By: Randa Ngo M.D.   On: 11/22/2020 15:14   Medications:  vancomycin      Chlorhexidine Gluconate Cloth  6 each Topical Q0600   collagenase   Topical Daily   insulin aspart  0-6 Units Subcutaneous TID WC   insulin glargine-yfgn  7 Units Subcutaneous Daily

## 2020-11-24 NOTE — Progress Notes (Signed)
Ot returned from MRI. Snoring, unarrousable. EEG department called to make them aware pt returned

## 2020-11-24 NOTE — Progress Notes (Signed)
Prolonged 42 min study completed; results pending; Dr Hortense Ramal notified.

## 2020-11-24 NOTE — Progress Notes (Signed)
[facility] for Infectious Disease  Date of Admission:  11/21/2020           Reason for visit: Follow up on MRSA infection  Current antibiotics: Vanc 8/22-pres  Previous antibiotics Cefepime 8/22-8/24  ASSESSMENT:    50 y.o. female admitted with:  Hx of MRSA bacteremia:  Seen at urgent care in [place] on 10/13/20 for sx of nausea and vomiting in setting of toe cellulitis of left foot.  Sent to the ED from there where she was found to have left 3rd toe purulence and erythema up to her midfoot.  Plain films that day negative for OM.  CRP 104.8 and ESR 95 that day.  Sent home with doxycycline for 5 days.  Blood cx that day positive in 2 of 2 sets for MRSA.  She was seen at wound center 7/21 who arranged for vancomycin with HD, however, was only treated 7/26-8/4.  Presenting now with encephalopathy, DKA, and need for HD.  Blood cx here NGTD but picture concerning for under treated MRSA BSI and possibility of CNS septic emboli given her bilateral vision complaints today. Metabolic encephalopathy and bilateral vision loss: MRI pending. DKA with type 1 diabetes Osteomyelitis of the second phalanx: Seen by podiatry this admission and has been following with wound care outpatient.  X-ray in July with no findings of bone destruction, however, x-ray this admit with findings consistent with osteomyelitis of the second phalanges.  ESR has improved from 95 on 7/14 down to 42 on 8/24 PAD with previous history of stent placement per prior notes ESRD on HD: Received dialysis yesterday Previous right TMA Diarrhea: C. difficile and GI panel negative   RECOMMENDATIONS:    Continue vancomycin dosed per pharmacy Follow-up repeat blood cultures to ensure no continued bacteremia MRI brain changed to stat given new vision complaints this morning TTE.  If negative, would likely plan for TEE MRI left foot when able to further evaluate for infection Wound care, glycemic control Discussed with  primary Will follow   Principal Problem:   MRSA bacteremia Active Problems:   Hypertension   Hypercholesteremia   Osteomyelitis (HCC)   Severe sepsis (HCC)   ESRD (end stage renal disease) on dialysis ([place])   DKA, type 1 (HCC)   Acute metabolic encephalopathy   Hypertensive urgency   DKA (diabetic ketoacidosis) ([place])    MEDICATIONS:    Scheduled Meds:  Chlorhexidine Gluconate Cloth  6 each Topical Q0600   collagenase   Topical Daily   insulin aspart  0-6 Units Subcutaneous TID WC   insulin glargine-yfgn  7 Units Subcutaneous Daily   LORazepam  2 mg Intravenous STAT   sodium zirconium cyclosilicate  10 g Oral Once   Continuous Infusions:  vancomycin     PRN Meds:.dextrose, haloperidol lactate, hydrALAZINE, LORazepam, ondansetron (ZOFRAN) IV  SUBJECTIVE:   24 hour events:  Patient lying in bed getting EEG.  More alert this morning and reports that she cannot see anything.  Discussed with primary team.  T-max 99.5 WBC 7.5 Repeat blood cultures no growth to date   Review of Systems  All other systems reviewed and are negative.    OBJECTIVE:   Blood pressure (!) 149/67, pulse 100, temperature 98.7 F (37.1 C), temperature source Axillary, resp. rate 20, height $RemoveBe'5\' 7"'[id number]$  (1.702 m), weight 57.6 kg, SpO2 97 %. Body mass index is 19.89 kg/m.  Physical Exam Constitutional:      Comments: Lying in bed.  HENT:  Head: Normocephalic and atraumatic.  Pulmonary:     Effort: Pulmonary effort is normal. No respiratory distress.  Musculoskeletal:     Cervical back: Normal range of motion and neck supple.     Comments: Right TMA. Left foot chronic ulcer.   Skin:    General: Skin is warm and dry.     Findings: No rash.     Lab Results: Lab Results  Component Value Date   WBC 7.5 11/24/2020   HGB 10.6 (L) 11/24/2020   HCT 32.8 (L) 11/24/2020   MCV 91.9 11/24/2020   PLT 230 11/24/2020    Lab Results  Component Value Date   NA 136 11/24/2020   K 3.7 11/24/2020    CO2 16 (L) 11/24/2020   GLUCOSE 160 (H) 11/24/2020   BUN 31 (H) 11/24/2020   CREATININE 6.35 (H) 11/24/2020   CALCIUM 8.4 (L) 11/24/2020   GFRNONAA 8 (L) 11/24/2020   GFRAA 16 (L) 08/08/2018    Lab Results  Component Value Date   ALT 32 11/23/2020   AST 26 11/23/2020   ALKPHOS 156 (H) 11/23/2020   BILITOT 0.9 11/23/2020       Component Value Date/Time   CRP 1.1 (H) 11/23/2020 1143   CRP 17.3 (H) 12/18/2016 1544       Component Value Date/Time   ESRSEDRATE 42 (H) 11/23/2020 1143     I have reviewed the micro and lab results in Epic.  Imaging: DG Abd 1 View  Result Date: 11/24/2020 CLINICAL DATA:  Evaluate for metallic foreign body EXAM: ABDOMEN - 1 VIEW COMPARISON:  None. FINDINGS: Scattered large and small bowel gas is noted. No abnormal mass or abnormal calcifications are seen. Belly button ring is noted. No bony abnormality is noted. Scattered vascular calcifications are noted. IMPRESSION: Umbilical ring is noted. No other focal abnormality is noted. Electronically Signed   By: Inez Catalina M.D.   On: 11/24/2020 03:45   CT MAXILLOFACIAL W CONTRAST  Result Date: 11/23/2020 CLINICAL DATA:  Facial cellulitis EXAM: CT MAXILLOFACIAL WITH CONTRAST TECHNIQUE: Multidetector CT imaging of the maxillofacial structures was performed with intravenous contrast. Multiplanar CT image reconstructions were also generated. CONTRAST:  21mL OMNIPAQUE IOHEXOL 300 MG/ML  SOLN COMPARISON:  CT head 11/21/2020 FINDINGS: Osseous: Periapical lucency around left lower molar compatible with infection which may be chronic. No fracture. Orbits: Bilateral cataract extraction.  No orbital mass or edema. Sinuses: Mucosal edema right maxillary sinus. Remaining sinuses clear. Mastoid clear bilaterally. Soft tissues: Mild soft tissue edema in the subcutaneous tissue of the anterior neck and lateral to the mandible bilaterally. No soft tissue abscess identified. Limited intracranial: No acute abnormality  IMPRESSION: Mild soft tissue swelling in the anterior neck bilaterally, nonspecific in appearance. Possible edema or cellulitis. No soft tissue abscess. Periapical lucency around left lower molar consistent with dental infection Electronically Signed   By: Franchot Gallo M.D.   On: 11/23/2020 17:14   DG Foot Complete Right  Result Date: 11/22/2020 CLINICAL DATA:  Wound, discharge EXAM: RIGHT FOOT COMPLETE - 3+ VIEW COMPARISON:  07/04/2020 FINDINGS: Frontal, oblique, lateral views of the right foot are obtained. 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 Extensive degenerative changes are seen throughout the hindfoot, unchanged since prior exam. There are no acute displaced fractures. The bones are diffusely osteopenic. No destructive changes or periosteal reaction to suggest osteomyelitis. There is mild diffuse soft tissue edema, most pronounced along the plantar medial aspect at the amputation site. No evidence  of subcutaneous gas or radiopaque foreign body. Overlying bandage material is seen along the medial right foot. IMPRESSION: 1. Extensive postsurgical and degenerative changes as above, stable since previous study. 2. No acute or destructive bony lesions. 3. Prominent soft tissue swelling along the plantar medial aspect of the right foot. Electronically Signed   By: Randa Ngo M.D.   On: 11/22/2020 15:14     Imaging independently reviewed in Epic.    [facility] [phone] pager 11/24/2020, 9:18 AM

## 2020-11-24 NOTE — Procedures (Addendum)
Patient Name: [name]  MRN: [id number]  Epilepsy Attending: Lora Havens  Referring Physician/Provider: Dr Oren Binet Date: 11/24/2020 Duration: 43.03 mins  Patient history: 50 year old female admitted with DKA, MRSA bacteremia and had sudden worsening of vision.  EEG to evaluate for seizures.  Level of alertness: Awake, asleep  AEDs during EEG study: None  Technical aspects: This EEG study was done with scalp electrodes positioned according to the 10-20 International system of electrode placement. Electrical activity was acquired at a sampling rate of 500Hz  and reviewed with a high frequency filter of 70Hz  and a low frequency filter of 1Hz . EEG data were recorded continuously and digitally stored.   Description: During awake state, no clear posterior dominant rhythm was seen.  EEG showed continuous 3 to 5 Hz theta-delta slowing.  There was also generalized and maximal left hemisphere periodic epileptiform discharges with triphasic morphology at 1.5 Hz which at times appeared rhythmic without definite evolution.  IV Ativan was administered on 11/24/2020 at 0 932 after which EEG showed sleep spindles (12 to 14 Hz), maximal frontocentral region as well as intermittent generalized 3 to 5 Hz theta and delta slowing.  Hyperventilation and photic stimulation were not performed.     ABNORMALITY - Periodic discharges with triphasic morphology, generalized and lateralized left hemisphere ( GPDs) - Continuous slow, generalized  IMPRESSION: This study initially showed generalized periodic discharges with triphasic morphology at 1.5 Hz which at times appeared rhythmic.  This EEG pattern is on the ictal and interictal continuum and can also be seen due to toxic-metabolic causes.  IV Ativan 2 mg was administered after which patient fell asleep and EEG pattern improved.  Improvement with benzodiazepine is not necessarily diagnostic of ictal nature.  Therefore, would recommend prolonged EEG  monitoring for further evaluation of this EEG pattern.  Meade [place] Barbra Sarks

## 2020-11-24 NOTE — Consult Note (Addendum)
Neurology Consultation Reason for Consult: ams Referring Physician: Dr Oren Binet  CC: ams  History is obtained from: chart review, patient, patient's sister at bedside  HPI: [name] is a 50 y.o. female with history of poorly controlled type 1 diabetes, end-stage renal disease on intermittent hemodialysis, bilateral feet amputations and previously diagnosed osteomyelitis, peripheral arterial disease with history of stent placement who presented on 11/21/2020 with altered mental status and elevated blood sugars.  She was diagnosed with DKA and bacteremia and started on vancomycin and cefepime.  Infectious disease was consulted, blood cultures showed MRSA bacteremia.  Therefore cefepime was discontinued yesterday.  This morning patient started complaining of "I cannot see anything" and can only see "bright light". Stat EEG was ordered which showed generalized periodic discharges with triphasic morphology at 1.5 Hz which was on the ictal interictal continuum.  EEG pattern improved after IV 2 mg Ativan. MRI brain without contrast was obtained which did not show any acute abnormality.  Neurology was consulted for further management.  When I initially evaluated the patient, she was sedated due to Ativan.  I went back to see the patient later in the afternoon when she was more awake and her sister was at bedside as well.  Patient reported her vision continues to be worse than her baseline.  Both patient and her daughter reported that patient has had similar episodes in the past when her blood pressure is too high or too low, blood sugars too high or too low or after dialysis her dry weight is less than 57kg.   ROS: All other systems reviewed and negative except as noted in the HPI.   Past Medical History:  Diagnosis Date   Anemia    Arthritis    Bronchitis    C. 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 Her smoking use included cigarettes. She has never used smokeless tobacco. She reports that she does not currently use alcohol. She reports that she does not use drugs.   Medications Prior to Admission  Medication Sig Dispense Refill Last Dose   albuterol (PROVENTIL HFA;VENTOLIN HFA) 108 (90 BASE) MCG/ACT inhaler Inhale 2 puffs into the lungs every 6 (six) hours as needed for wheezing or shortness of breath.       amLODipine (NORVASC) 5 MG tablet Take 5 mg by mouth daily.      aspirin EC 81 MG tablet Take 81 mg by  mouth daily.      B Complex-C-Zn-Folic Acid (DIALYVITE 443 WITH ZINC) 0.8 MG TABS Take 1 tablet by mouth daily.      calcitRIOL (ROCALTROL) 0.5 MCG capsule Take 1 capsule daily 90 capsule 2    calcium acetate (PHOSLO) 667 MG capsule Take 2 capsules (1,334 mg total) by mouth 3 (three) times daily with meals. 180 capsule 0    Continuous Blood Gluc Receiver ([place]) DEVI 1 each by Does not apply route See admin instructions. Use Dexcom Receiver to monitor blood sugar continuously. 1 each 0    Continuous Blood Gluc Sensor (DEXCOM G6 SENSOR) MISC 1 each by Does not apply route See admin instructions. Use one sensors once  every 10 days to monitor blood sugars. 9 each 3    Continuous Blood Gluc Transmit (DEXCOM G6 TRANSMITTER) MISC 1 each by Does not apply route See admin instructions. Use one transmitter once every 90 days. 1 each 3    CONTOUR NEXT TEST test strip USE AS INSTRUCTED TO CHECK BLOOD SUGAR 4 TIMES A DAY. DX E10.65 100 strip 4    Cyanocobalamin (B-12) 5000 MCG CAPS Take 5,000 mcg by mouth daily.      diphenhydrAMINE (BENADRYL) 25 MG tablet Take 25 mg by mouth daily as needed.       folic acid (FOLVITE) 154 MCG tablet Take 400 mcg by mouth daily.      heparin 1000 unit/ml SOLN injection Heparin Sodium (Porcine) 1,000 Units/mL Catheter Lock Arterial      insulin aspart (NOVOLOG) 100 UNIT/ML injection USE A MAX OF 65 UNITS DAILY VIA INSULIN PUMP 10 mL 2    Insulin Human (INSULIN PUMP) SOLN Inject into the skin. Insulin aspart (Novolog) 100 unit/ml---MAX 65 UNITS DAILY      lidocaine-prilocaine (EMLA) cream Apply 1 application topically daily as needed. Port access      norethindrone (AYGESTIN) 5 MG tablet Take 5 mg by mouth See admin instructions. 5mg  daily for 14 days, then off for 14 days, then repeat cycle.      Omega-3 Fatty Acids (OMEGA 3 500) 500 MG CAPS Take 500 mg by mouth daily.      ondansetron (ZOFRAN ODT) 4 MG disintegrating tablet Take 1 tablet (4 mg total) by mouth every 8 (eight) hours as needed for nausea or vomiting. 20 tablet 0    pravastatin (PRAVACHOL) 40 MG tablet Take 1 tablet (40 mg total) by mouth daily. 90 tablet 1       Exam: Current vital signs: BP (!) 145/67 (BP Location: Left Leg)   Pulse 89   Temp 98.4 F (36.9 C) (Axillary)   Resp 16   Ht 5\' 7"  (1.702 m)   Wt 57.6 kg   SpO2 97%   BMI 19.89 kg/m  Vital signs in last 24 hours: Temp:  [98.4 F (36.9 C)-99.5 F (37.5 C)] 98.4 F (36.9 C) (08/25 1158) Pulse Rate:  [89-100] 89 (08/25 1158) Resp:  [16-20] 16 (08/25 1158) BP: (145-177)/(65-83) 145/67 (08/25 1158) SpO2:  [95 %-98 %] 97 % (08/25 1158)   Physical  Exam  Constitutional: Appears well-developed and well-nourished.  Psych: Able to provide history, at times agitated and requesting to take off her restraints Eyes: No scleral injection HENT: No OP obstrucion Head: Normocephalic.  Cardiovascular: Normal rate and regular rhythm.  Respiratory: Effort normal, non-labored breathing GI: Soft.  No distension. There is no tenderness.  Skin: Warm, bilateral feet amputation. Neuro: Awake, alert, oriented x3, follows commands, unable to even  count fingers with both eyes (per sister, 25 patient at baseline only has tunnel vision), PERRLA, no apparent facial asymmetry, antigravity strength in all 4 extremities  I have reviewed labs in epic and the results pertinent to this consultation are: CBC:  Recent Labs  Lab 11/21/20 0550 11/21/20 1227 11/22/20 0714 11/23/20 0141 11/24/20 0200  WBC 10.3 7.8   < > 7.9 7.5  NEUTROABS 8.6* 6.0  --   --   --   HGB 11.0* 10.2*   < > 9.6* 10.6*  HCT 32.8* 30.6*   < > 28.6* 32.8*  MCV 89.4 88.4   < > 88.8 91.9  PLT 246 226   < > 193 230   < > = values in this interval not displayed.    Basic Metabolic Panel:  Lab Results  Component Value Date   NA 136 11/24/2020   K 3.7 11/24/2020   CO2 16 (L) 11/24/2020   GLUCOSE 160 (H) 11/24/2020   BUN 31 (H) 11/24/2020   CREATININE 6.35 (H) 11/24/2020   CALCIUM 8.4 (L) 11/24/2020   GFRNONAA 8 (L) 11/24/2020   GFRAA 16 (L) 08/08/2018   Lipid Panel:  Lab Results  Component Value Date   LDLCALC 78 05/02/2016   HgbA1c:  Lab Results  Component Value Date   HGBA1C 8.1 (A) 11/02/2020   Urine Drug Screen:     Component Value Date/Time   LABOPIA NONE DETECTED 11/23/2020 0410   COCAINSCRNUR NONE DETECTED 11/23/2020 0410   LABBENZ NONE DETECTED 11/23/2020 0410   AMPHETMU NONE DETECTED 11/23/2020 0410   THCU NONE DETECTED 11/23/2020 0410   LABBARB NONE DETECTED 11/23/2020 0410    Alcohol Level No results found for: ETH  I have reviewed the images  obtained:  MRI brain without contrast 11/24/2020: No acute intracranial pathology.Bilateral mastoid effusions  ASSESSMENT/PLAN: 57 old female who presented with DKA and was found to have MRSA bacteremia.  This morning patient had worsening vision, MRI brain did not show any acute abnormality.  Vision disturbance Acute encephalopathy, toxic-metabolic (improving) -After obtaining further history, reviewing MRI brain and reviewing the long-term EEG, the vision disturbance is more likely due to blood pressure fluctuations, fluctuations in blood sugar.  Low suspicion for seizures at this point.  MRI brain did not show any acute stroke therefore low suspicion for stroke infectious emboli  Recommendations: -We will continue video EEG for 24 hours to assess for intermittent seizures -Will not add any AEDs yet unless EEG shows definite epileptic abnormalities -Minimize blood pressure fluctuations and blood glucose fluctuations in -Ophthalmology consulted -Management of rest of comorbidities per primary team  Thank you for allowing [place] to participate in the care of this patient. If you have any further questions, please contact  me or neurohospitalist.   I have spent a total of  130  minutes with the patient reviewing hospital notes,  test results, labs and examining the patient as well as establishing an assessment and plan that was discussed personally with the patient, sister at bedside and Dr Sloan Leiter  > 50% of time was spent in direct patient care.   Zeb [facility] neurohospitalist

## 2020-11-24 NOTE — Progress Notes (Addendum)
PROGRESS NOTE        PATIENT DETAILS Name: [name] Age: 50 y.o. Sex: female Date of Birth: May 05, 1970 Admit Date: 11/21/2020 Admitting Physician Jacqlyn Krauss, MD OMV:[id number], Babs Bertin, PA-C  Brief Narrative: Patient is a 50 y.o. female with history of DM-1, diabetic retinopathy, chronic left foot osteomyelitis, ESRD on HD-recent history of MRSA bacteremia-presented with acute metabolic encephalopathy, DKA and left diabetic foot with osteomyelitis.  On 8/25-her encephalopathy finally improved-she then claimed to have visual loss in both eyes.   EEG done on 8/25 was positive for seizures.  See below for further details.  Significant events: 7/14>> nausea/vomiting-left foot cellulitis-evaluated at urgent care in [place] on 7/14-blood cultures positive for MRSA.  Declined admission.  Subsequently received IV vancomycin with HD from 7/26-8/04 8/22>> presented to [facility] ED with confusion-found to have DKA 8/25>> finally more awake and alert-claims she cannot see out of both eyes.  EEG positive for seizures.  Significant studies: 8/22>> x-ray left foot: Osteomyelitis at the second phalange 8/22>> CXR: No pneumonia 8/22>> CT head: No acute intracranial abnormality 8/24>> CT maxillofacial area: No acute infection 8/25>> EEG: Positive for seizures-formal reading pending. 8/25>> MRI brain: No acute intracranial pathology.  Antimicrobial therapy: Vancomycin: 8/22>> Cefepime: 8/22>>  Microbiology data: 8/22>> COVID/influenza PCR: Negative 8/22>> blood culture: Negative so far  Procedures : None  Consults: ID, nephrology, podiatry, neurology  DVT Prophylaxis : SCDs Start: 11/21/20 1037   Subjective: Seen earlier this morning-she was much more awake and alert.  Able to tell me her name-her sister's name-and that her child recently passed away (confirmed by sister on the phone).  While I was in the room-RN informed me that patient had earlier  complained of visual loss in both eyes-I subsequently examined the patient-does not blink with startle reflex-claims apart from seeing a white shadow-she cannot see anything.     Assessment/Plan: Acute metabolic encephalopathy: Probably multifactorial etiology-DKA/elevated BUN in the setting of HD/osteomyelitis of left foot-now appears that she could have had seizures (seen on EEG earlier this morning).  Thankfully her mentation has improved significantly following HD that was done yesterday.  Neuroimaging as above-continue to follow closely.    Nonepileptic status epilepticus: EEG done this morning positive for seizures-Per my discussion with Dr. Hortense Ramal (neurology)-epileptiform waves seen in the left occipital cortex area.  Unclear if this is causing her visual loss.  MRI without any structural issues.  Given Ativan-Per neurology-seizures suppressed on EEG with Ativan.  Once she is a bit more awake and alert-we will need to reassess if her vision (has significant underlying history of diabetic retinopathy) has improved.  Bilateral visual loss: When patient was more awake and alert this morning (was encephalopathic for the past 2 days)-she claimed that she could not see out of both eyes.  She does have a significant history of underlying diabetic nephropathy (spoke with her primary ophthalmologist this morning) and gets retinal injections every 8 weeks-and is status post numerous laser procedures.  Have formally consulted ophthalmology-we will await further recommendations.  Interestingly-Per neurology report-EEG noted seizure activity in the left occipital area-unclear at this point whether her visual issues are from seizure activity.  Once patient is more awake and alert (given Ativan with EEG) will need to be reassessed.  Sepsis due to acute osteomyelitis involving the left second phalanx-possibility of underlying MRSA infection: Per ID note-patient had  positive blood cultures with MRSA in [place] on  7/14-it appears that she declined admission and apparently received treatment with IV vancomycin with hemodialysis from 7/26-8/4.  Evaluated by podiatry on 8/23-she appears to have more of a chronic osteomyelitis rather than a acute osteomyelitis. Blood cultures on 8/22 negative so far-remains on IV vancomycin-awaiting echo for vegetation screening.  Sepsis physiology and encephalopathy have markedly improved.  Facial swelling: Etiology felt to be either due to volume overload-improved after Foley removal with HD on 8/24.  CT maxillofacial area does not show any acute infection.    DKA history of DM 1 on insulin pump (A1c 8.1 on 8/3): DKA resolved with IV insulin-CBGs currently stable with 7 units of Lantus and SSI.  Once she is a bit more awake and alert-and diet is stable-we can consider putting her back on her insulin pump.   Diarrhea:?  Etiology-unclear if patient has bowel related autonomic neuropathy in the setting of diabetes.  C. difficile studies and GI pathogen panel negative.  Will use prn Imodium.  ESRD: Nephrology following-we will defer HD to nephrology.  Normocytic anemia: Due to ESRD-defer need for EPO/IV iron to nephrology.  HLD: Resume statin when able  History of right transmetatarsal amputation  Diet: Diet Order             Diet NPO time specified  Diet effective now                    Code Status: Full code or DNR  Family Communication: Spoke with sister-Alicia [phone] over phone on 8/25  Disposition Plan: Status is: Inpatient  Remains inpatient appropriate because:Inpatient level of care appropriate due to severity of illness  Dispo: The patient is from: Home              Anticipated d/c is to: Home              Patient currently is not medically stable to d/c.   Difficult to place patient No   Barriers to Discharge: Resolving encephalopathy-now with seizure disorder-blindness-work-up in progress.  See above.    Antimicrobial  agents: Anti-infectives (From admission, onward)    Start     Dose/Rate Route Frequency Ordered Stop   11/23/20 1800  ceFEPIme (MAXIPIME) 1 g in sodium chloride 0.9 % 100 mL IVPB  Status:  Discontinued        1 g 200 mL/hr over 30 Minutes Intravenous Daily-1800 11/23/20 1126 11/23/20 1145   11/23/20 1030  vancomycin (VANCOREADY) IVPB 750 mg/150 mL        750 mg 150 mL/hr over 60 Minutes Intravenous To Hemodialysis 11/23/20 1021 11/23/20 1305   11/23/20 1030  ceFEPIme (MAXIPIME) 2 g in sodium chloride 0.9 % 100 mL IVPB  Status:  Discontinued        2 g 200 mL/hr over 30 Minutes Intravenous To Hemodialysis 11/23/20 1021 11/23/20 1125   11/22/20 1800  ceFEPIme (MAXIPIME) 2 g in sodium chloride 0.9 % 100 mL IVPB  Status:  Discontinued        2 g 200 mL/hr over 30 Minutes Intravenous Every T-Th-Sa (Hemodialysis) 11/21/20 2018 11/22/20 0816   11/22/20 1800  ceFEPIme (MAXIPIME) 2 g in sodium chloride 0.9 % 100 mL IVPB  Status:  Discontinued        2 g 200 mL/hr over 30 Minutes Intravenous Every T-Th-Sa (1800) 11/22/20 0816 11/23/20 1125   11/22/20 1500  vancomycin (VANCOREADY) IVPB 750 mg/150 mL  750 mg 150 mL/hr over 60 Minutes Intravenous Every T-Th-Sa (Hemodialysis) 11/22/20 1357     11/22/20 1000  ceFEPIme (MAXIPIME) 2 g in sodium chloride 0.9 % 100 mL IVPB  Status:  Discontinued       Note to Pharmacy: Pharmacy to dose   2 g 200 mL/hr over 30 Minutes Intravenous Every 24 hours 11/21/20 1035 11/21/20 2017   11/22/20 0435  vancomycin variable dose per unstable renal function (pharmacist dosing)  Status:  Discontinued         Does not apply See admin instructions 11/22/20 0435 11/23/20 1022   11/21/20 0700  vancomycin (VANCOCIN) IVPB 1000 mg/200 mL premix        1,000 mg 200 mL/hr over 60 Minutes Intravenous  Once 11/21/20 0648 11/21/20 1247   11/21/20 0700  ceFEPIme (MAXIPIME) 2 g in sodium chloride 0.9 % 100 mL IVPB        2 g 200 mL/hr over 30 Minutes Intravenous  Once 11/21/20  0656 11/21/20 1022   11/21/20 0645  metroNIDAZOLE (FLAGYL) IVPB 500 mg        500 mg 100 mL/hr over 60 Minutes Intravenous  Once 11/21/20 0636 11/21/20 0816        Time spent: 45 minutes-Greater than 50% of this time was spent in counseling, explanation of diagnosis, planning of further management, and coordination of care.  MEDICATIONS: Scheduled Meds:  Chlorhexidine Gluconate Cloth  6 each Topical Q0600   collagenase   Topical Daily   insulin aspart  0-6 Units Subcutaneous TID WC   insulin glargine-yfgn  7 Units Subcutaneous Daily   Continuous Infusions:  vancomycin     PRN Meds:.dextrose, haloperidol lactate, hydrALAZINE, LORazepam, ondansetron (ZOFRAN) IV   PHYSICAL EXAM: Vital signs: Vitals:   11/24/20 0025 11/24/20 0400 11/24/20 0737 11/24/20 1158  BP: (!) 157/71 (!) 150/70 (!) 149/67 (!) 145/67  Pulse: 100 99 100 89  Resp: 18 18 20 16   Temp: 99.1 F (37.3 C) 99 F (37.2 C) 98.7 F (37.1 C) 98.4 F (36.9 C)  TempSrc: Oral Oral Axillary Axillary  SpO2: 98% 97%  97%  Weight:      Height:       Filed Weights   11/21/20 0600 11/23/20 0949 11/23/20 1347  Weight: 60.8 kg 61.1 kg 57.6 kg   Body mass index is 19.89 kg/m.   Gen Exam: When seen earlier this morning-much more awake and alert compared to yesterday.  Following commands and-answering simple questions appropriately. HEENT:atraumatic, normocephalic Chest: B/L clear to auscultation anteriorly CVS:S1S2 regular Abdomen:soft non tender, non distended Extremities:no edema-s/p right TMA Neurology: Non focal Skin: no rash   I have personally reviewed following labs and imaging studies  LABORATORY DATA: CBC: Recent Labs  Lab 11/21/20 0550 11/21/20 1227 11/22/20 0714 11/22/20 1221 11/23/20 0141 11/24/20 0200  WBC 10.3 7.8 8.9 6.4 7.9 7.5  NEUTROABS 8.6* 6.0  --   --   --   --   HGB 11.0* 10.2* 9.5* 10.1* 9.6* 10.6*  HCT 32.8* 30.6* 28.1* 29.6* 28.6* 32.8*  MCV 89.4 88.4 88.6 86.8 88.8 91.9  PLT  246 226 215 208 193 230     Basic Metabolic Panel: Recent Labs  Lab 11/22/20 1441 11/22/20 1944 11/22/20 2218 11/23/20 0735 11/24/20 0200  NA 131* 129* 130* 130* 136  K 5.4* 5.1 5.1 4.9 3.7  CL 93* 94* 94* 95* 99  CO2 15* 14* 18* 16* 16*  GLUCOSE 161* 176* 112* 140* 160*  BUN 74* 76* 78* 79*  31*  CREATININE 9.29* 9.69* 9.81* 9.95* 6.35*  CALCIUM 8.1* 8.3* 8.3* 8.2* 8.4*  PHOS  --   --   --  6.0* 4.8*     GFR: Estimated Creatinine Clearance: 9.7 mL/min (A) (by C-G formula based on SCr of 6.35 mg/dL (H)).  Liver Function Tests: Recent Labs  Lab 11/21/20 0550 11/23/20 0735 11/24/20 0200  AST 28 26  --   ALT 45* 32  --   ALKPHOS 242* 156*  --   BILITOT 0.7 0.9  --   PROT 8.1 6.6  --   ALBUMIN 3.9 3.0*  3.1* 3.0*    Recent Labs  Lab 11/21/20 0550  LIPASE 40    Recent Labs  Lab 11/23/20 0852  AMMONIA 51*     Coagulation Profile: Recent Labs  Lab 11/21/20 0550  INR 1.0     Cardiac Enzymes: No results for input(s): CKTOTAL, CKMB, CKMBINDEX, TROPONINI in the last 168 hours.  BNP (last 3 results) No results for input(s): PROBNP in the last 8760 hours.  Lipid Profile: No results for input(s): CHOL, HDL, LDLCALC, TRIG, CHOLHDL, LDLDIRECT in the last 72 hours.  Thyroid Function Tests: No results for input(s): TSH, T4TOTAL, FREET4, T3FREE, THYROIDAB in the last 72 hours.  Anemia Panel: No results for input(s): VITAMINB12, FOLATE, FERRITIN, TIBC, IRON, RETICCTPCT in the last 72 hours.  Urine analysis:    Component Value Date/Time   COLORURINE YELLOW 11/23/2020 0410   APPEARANCEUR CLEAR 11/23/2020 0410   LABSPEC 1.012 11/23/2020 0410   PHURINE 7.0 11/23/2020 0410   GLUCOSEU >=500 (A) 11/23/2020 0410   GLUCOSEU 500 (A) 01/26/2015 1200   HGBUR NEGATIVE 11/23/2020 0410   BILIRUBINUR NEGATIVE 11/23/2020 0410   KETONESUR 5 (A) 11/23/2020 0410   PROTEINUR 100 (A) 11/23/2020 0410   UROBILINOGEN 0.2 01/26/2015 1200   NITRITE NEGATIVE 11/23/2020 0410    LEUKOCYTESUR NEGATIVE 11/23/2020 0410    Sepsis Labs: Lactic Acid, Venous    Component Value Date/Time   LATICACIDVEN 1.6 11/21/2020 1227    MICROBIOLOGY: Recent Results (from the past 240 hour(s))  Blood culture (routine single)     Status: None (Preliminary result)   Collection Time: 11/21/20  5:50 AM   Specimen: BLOOD  Result Value Ref Range Status   Specimen Description   Final    BLOOD SITE NOT SPECIFIED Performed at [facility] [address] [place], [place] [address]    Special Requests   Final    BOTTLES DRAWN AEROBIC AND ANAEROBIC Blood Culture adequate volume Performed at [facility] [address] [place], [place] [address]    Culture   Final    NO GROWTH 3 DAYS Performed at [facility], [place] [address] [place], [place] [address]    Report Status PENDING  Incomplete  Resp Panel by RT-PCR (Flu A&B, Covid) Nasopharyngeal Swab     Status: None   Collection Time: 11/21/20  8:11 AM   Specimen: Nasopharyngeal Swab; Nasopharyngeal(NP) swabs in vial transport medium  Result Value Ref Range Status   SARS Coronavirus 2 by RT PCR NEGATIVE NEGATIVE Final    Comment: (NOTE) SARS-CoV-2 target nucleic acids are NOT DETECTED.  The SARS-CoV-2 RNA is generally detectable in upper respiratory specimens during the acute phase of infection. The lowest concentration of SARS-CoV-2 viral copies this assay can detect is 138 copies/mL. A negative result does not preclude SARS-Cov-2 infection and should not be used as the sole basis for treatment or other patient management decisions.  A negative result may occur with  improper specimen collection/handling, submission of specimen other than nasopharyngeal swab, presence of viral mutation(s) within the areas targeted by this assay, and inadequate number of viral copies(<138 copies/mL). A negative result must be combined with clinical observations, patient history, and  epidemiological information. The expected result is Negative.  Fact Sheet for Patients:  EntrepreneurPulse.com.au  Fact Sheet for Healthcare Providers:  IncredibleEmployment.be  This test is no t yet approved or cleared by the [place] FDA and  has been authorized for detection and/or diagnosis of SARS-CoV-2 by FDA under an Emergency Use Authorization (EUA). This EUA will remain  in effect (meaning this test can be used) for the duration of the COVID-19 declaration under Section 564(b)(1) of the Act, 21 U.S.C.section 360bbb-3(b)(1), unless the authorization is terminated  or revoked sooner.       Influenza A by PCR NEGATIVE NEGATIVE Final   Influenza B by PCR NEGATIVE NEGATIVE Final    Comment: (NOTE) The Xpert Xpress SARS-CoV-2/FLU/RSV plus assay is intended as an aid in the diagnosis of influenza from Nasopharyngeal swab specimens and should not be used as a sole basis for treatment. Nasal washings and aspirates are unacceptable for Xpert Xpress SARS-CoV-2/FLU/RSV testing.  Fact Sheet for Patients: EntrepreneurPulse.com.au  Fact Sheet for Healthcare Providers: IncredibleEmployment.be  This test is not yet approved or cleared by the [place] FDA and has been authorized for detection and/or diagnosis of SARS-CoV-2 by FDA under an Emergency Use Authorization (EUA). This EUA will remain in effect (meaning this test can be used) for the duration of the COVID-19 declaration under Section 564(b)(1) of the Act, 21 U.S.C. section 360bbb-3(b)(1), unless the authorization is terminated or revoked.  Performed at [facility], [place] [address] [place], [place] [address]   Culture, blood (single)     Status: None (Preliminary result)   Collection Time: 11/21/20  3:45 PM   Specimen: BLOOD RIGHT HAND  Result Value Ref Range Status   Specimen Description BLOOD RIGHT HAND  Final    Special Requests IN PEDIATRIC BOTTLE Blood Culture adequate volume  Final   Culture   Final    NO GROWTH 3 DAYS Performed at [facility], [place] [address] [place], [place] [address]    Report Status PENDING  Incomplete  MRSA Next Gen by PCR, Nasal     Status: Abnormal   Collection Time: 11/22/20 10:57 AM   Specimen: Nasal Mucosa; Nasal Swab  Result Value Ref Range Status   MRSA by PCR Next Gen DETECTED (A) NOT DETECTED Final    Comment: RESULT CALLED TO, READ BACK BY AND VERIFIED WITH: Maryan Puls RN 11/22/2020 @1417  BY JW (NOTE) The GeneXpert MRSA Assay (FDA approved for NASAL specimens only), is one component of a comprehensive MRSA colonization surveillance program. It is not intended to diagnose MRSA infection nor to guide or monitor treatment for MRSA infections. Test performance is not FDA approved in patients less than 45 years old. Performed at [facility], [place] [address], [place], [place] [address]   C Difficile Quick Screen w PCR reflex     Status: None   Collection Time: 11/22/20  4:33 PM   Specimen: STOOL  Result Value Ref Range Status   C Diff antigen NEGATIVE NEGATIVE Final   C Diff toxin NEGATIVE NEGATIVE Final   C Diff interpretation No C. difficile detected.  Final    Comment: Performed at [facility], [place],  [place] [address]  Gastrointestinal Panel by PCR , Stool     Status: None   Collection Time: 11/22/20  4:33 PM   Specimen: STOOL  Result Value Ref Range Status   Campylobacter species NOT DETECTED NOT DETECTED Final   Plesimonas shigelloides NOT DETECTED NOT DETECTED Final   Salmonella species NOT DETECTED NOT DETECTED Final   Yersinia enterocolitica NOT DETECTED NOT DETECTED Final   Vibrio species NOT DETECTED NOT DETECTED Final   Vibrio cholerae NOT DETECTED NOT DETECTED Final   Enteroaggregative E coli (EAEC) NOT DETECTED NOT DETECTED Final   Enteropathogenic E coli (EPEC) NOT DETECTED NOT DETECTED Final    Enterotoxigenic E coli (ETEC) NOT DETECTED NOT DETECTED Final   Shiga like toxin producing E coli (STEC) NOT DETECTED NOT DETECTED Final   Shigella/Enteroinvasive E coli (EIEC) NOT DETECTED NOT DETECTED Final   Cryptosporidium NOT DETECTED NOT DETECTED Final   Cyclospora cayetanensis NOT DETECTED NOT DETECTED Final   Entamoeba histolytica NOT DETECTED NOT DETECTED Final   Giardia lamblia NOT DETECTED NOT DETECTED Final   Adenovirus F40/41 NOT DETECTED NOT DETECTED Final   Astrovirus NOT DETECTED NOT DETECTED Final   Norovirus GI/GII NOT DETECTED NOT DETECTED Final   Rotavirus A NOT DETECTED NOT DETECTED Final   Sapovirus (I, II, IV, and V) NOT DETECTED NOT DETECTED Final    Comment: Performed at [facility], [place] [place], [place] [address]  Culture, blood (routine x 2)     Status: None (Preliminary result)   Collection Time: 11/23/20  3:26 PM   Specimen: BLOOD RIGHT HAND  Result Value Ref Range Status   Specimen Description BLOOD RIGHT HAND  Final   Special Requests   Final    BOTTLES DRAWN AEROBIC AND ANAEROBIC Blood Culture results may not be optimal due to an excessive volume of blood received in culture bottles   Culture   Final    NO GROWTH < 24 HOURS Performed at [facility], [address], [place], [place] [address]    Report Status PENDING  Incomplete  Culture, blood (routine x 2)     Status: None (Preliminary result)   Collection Time: 11/23/20  3:27 PM   Specimen: BLOOD RIGHT HAND  Result Value Ref Range Status   Specimen Description BLOOD RIGHT HAND  Final   Special Requests   Final    BOTTLES DRAWN AEROBIC AND ANAEROBIC Blood Culture results may not be optimal due to an excessive volume of blood received in culture bottles   Culture   Final    NO GROWTH < 24 HOURS Performed at [facility], [place] [address], [place], [place] [address]    Report Status PENDING  Incomplete    RADIOLOGY STUDIES/RESULTS: DG Abd 1 View  Result Date:  11/24/2020 CLINICAL DATA:  Evaluate for metallic foreign body EXAM: ABDOMEN - 1 VIEW COMPARISON:  None. FINDINGS: Scattered large and small bowel gas is noted. No abnormal mass or abnormal calcifications are seen. Belly button ring is noted. No bony abnormality is noted. Scattered vascular calcifications are noted. IMPRESSION: Umbilical ring is noted. No other focal abnormality is noted. Electronically Signed   By: Inez Catalina M.D.   On: 11/24/2020 03:45   MR BRAIN WO CONTRAST  Result Date: 11/24/2020 CLINICAL DATA:  Altered mental status, metabolic encephalopathy EXAM: MRI HEAD WITHOUT CONTRAST TECHNIQUE: Multiplanar, multiecho pulse sequences of the brain and surrounding structures were obtained without intravenous contrast. COMPARISON:  CT head 11/21/2020 FINDINGS: Brain:  There is no evidence of acute intracranial hemorrhage, extra-axial fluid collection, or infarct. There is no focal parenchymal signal abnormality. There is mild global parenchymal volume loss. The ventricles are enlarged. No mass lesion is identified. There is no midline shift. Vascular: Normal flow voids. Skull and upper cervical spine: Normal marrow signal. Sinuses/Orbits: The paranasal sinuses are clear. Bilateral lens implants are in place. The bones and orbits are otherwise unremarkable. Other: There are bilateral mastoid effusions. IMPRESSION: 1. No acute intracranial pathology. 2. Bilateral mastoid effusions. Electronically Signed   By: Valetta Mole M.D.   On: 11/24/2020 11:52   CT MAXILLOFACIAL W CONTRAST  Result Date: 11/23/2020 CLINICAL DATA:  Facial cellulitis EXAM: CT MAXILLOFACIAL WITH CONTRAST TECHNIQUE: Multidetector CT imaging of the maxillofacial structures was performed with intravenous contrast. Multiplanar CT image reconstructions were also generated. CONTRAST:  12mL OMNIPAQUE IOHEXOL 300 MG/ML  SOLN COMPARISON:  CT head 11/21/2020 FINDINGS: Osseous: Periapical lucency around left lower molar compatible with  infection which may be chronic. No fracture. Orbits: Bilateral cataract extraction.  No orbital mass or edema. Sinuses: Mucosal edema right maxillary sinus. Remaining sinuses clear. Mastoid clear bilaterally. Soft tissues: Mild soft tissue edema in the subcutaneous tissue of the anterior neck and lateral to the mandible bilaterally. No soft tissue abscess identified. Limited intracranial: No acute abnormality IMPRESSION: Mild soft tissue swelling in the anterior neck bilaterally, nonspecific in appearance. Possible edema or cellulitis. No soft tissue abscess. Periapical lucency around left lower molar consistent with dental infection Electronically Signed   By: Franchot Gallo M.D.   On: 11/23/2020 17:14   DG Foot Complete Right  Result Date: 11/22/2020 CLINICAL DATA:  Wound, discharge EXAM: RIGHT FOOT COMPLETE - 3+ VIEW COMPARISON:  07/04/2020 FINDINGS: Frontal, oblique, lateral views of the right foot are obtained. 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 Extensive degenerative changes are seen throughout the hindfoot, unchanged since prior exam. There are no acute displaced fractures. The bones are diffusely osteopenic. No destructive changes or periosteal reaction to suggest osteomyelitis. There is mild diffuse soft tissue edema, most pronounced along the plantar medial aspect at the amputation site. No evidence of subcutaneous gas or radiopaque foreign body. Overlying bandage material is seen along the medial right foot. IMPRESSION: 1. Extensive postsurgical and degenerative changes as above, stable since previous study. 2. No acute or destructive bony lesions. 3. Prominent soft tissue swelling along the plantar medial aspect of the right foot. Electronically Signed   By: Randa Ngo M.D.   On: 11/22/2020 15:14     LOS: 3 days   Oren Binet, MD  [facility]    To contact the attending provider between 7A-7P or the covering provider during after  hours 7P-7A, please log into the web site [email or web address] and access using universal [place] password for that web site. If you do not have the password, please call the hospital operator.  11/24/2020, 12:20 PM

## 2020-11-25 DIAGNOSIS — R4182 Altered mental status, unspecified: Secondary | ICD-10-CM | POA: Diagnosis not present

## 2020-11-25 DIAGNOSIS — N186 End stage renal disease: Secondary | ICD-10-CM | POA: Diagnosis not present

## 2020-11-25 DIAGNOSIS — E1011 Type 1 diabetes mellitus with ketoacidosis with coma: Secondary | ICD-10-CM | POA: Diagnosis not present

## 2020-11-25 DIAGNOSIS — R7881 Bacteremia: Secondary | ICD-10-CM | POA: Diagnosis not present

## 2020-11-25 LAB — GLUCOSE, CAPILLARY
Glucose-Capillary: 106 mg/dL — ABNORMAL HIGH (ref 70–99)
Glucose-Capillary: 147 mg/dL — ABNORMAL HIGH (ref 70–99)
Glucose-Capillary: 117 mg/dL — ABNORMAL HIGH (ref 70–99)
Glucose-Capillary: 181 mg/dL — ABNORMAL HIGH (ref 70–99)

## 2020-11-25 LAB — CBC
HCT: 34.3 % — ABNORMAL LOW (ref 36.0–46.0)
Hemoglobin: 11.1 g/dL — ABNORMAL LOW (ref 12.0–15.0)
MCH: 29.4 pg (ref 26.0–34.0)
MCHC: 32.4 g/dL (ref 30.0–36.0)
MCV: 91 fL (ref 80.0–100.0)
Platelets: 274 10*3/uL (ref 150–400)
RBC: 3.77 MIL/uL — ABNORMAL LOW (ref 3.87–5.11)
RDW: 14.3 % (ref 11.5–15.5)
WBC: 5.1 10*3/uL (ref 4.0–10.5)
nRBC: 0 % (ref 0.0–0.2)

## 2020-11-25 LAB — RENAL FUNCTION PANEL
Albumin: 2.8 g/dL — ABNORMAL LOW (ref 3.5–5.0)
Anion gap: 17 — ABNORMAL HIGH (ref 5–15)
BUN: 39 mg/dL — ABNORMAL HIGH (ref 6–20)
CO2: 20 mmol/L — ABNORMAL LOW (ref 22–32)
Calcium: 8 mg/dL — ABNORMAL LOW (ref 8.9–10.3)
Chloride: 100 mmol/L (ref 98–111)
Creatinine, Ser: 9.07 mg/dL — ABNORMAL HIGH (ref 0.44–1.00)
GFR, Estimated: 5 mL/min — ABNORMAL LOW (ref >60)
Glucose, Bld: 103 mg/dL — ABNORMAL HIGH (ref 70–99)
Phosphorus: 6.7 mg/dL — ABNORMAL HIGH (ref 2.5–4.6)
Potassium: 4.2 mmol/L (ref 3.5–5.1)
Sodium: 137 mmol/L (ref 135–145)

## 2020-11-25 MED ORDER — CHLORHEXIDINE GLUCONATE CLOTH 2 % EX PADS
6.0000 | MEDICATED_PAD | Freq: Every day | CUTANEOUS | Status: DC
  Administered 2020-11-27 – 2020-11-30 (×3): 6 via TOPICAL

## 2020-11-25 MED ORDER — DOXERCALCIFEROL 4 MCG/2ML IV SOLN
9.0000 ug | INTRAVENOUS | Status: DC
  Administered 2020-11-29: 9 ug via INTRAVENOUS
  Filled 2020-11-25 (×4): qty 6

## 2020-11-25 MED ORDER — LABETALOL HCL 5 MG/ML IV SOLN: 10.0000 mg | INTRAVENOUS | Status: DC | PRN

## 2020-11-25 MED ORDER — MELATONIN 3 MG PO TABS
3.0000 mg | ORAL_TABLET | Freq: Every evening | ORAL | Status: DC | PRN
  Administered 2020-11-25 – 2020-11-30 (×6): 3 mg via ORAL
  Filled 2020-11-25 (×6): qty 1

## 2020-11-25 MED ORDER — METHOCARBAMOL 500 MG PO TABS
500.0000 mg | ORAL_TABLET | Freq: Four times a day (QID) | ORAL | Status: DC | PRN
  Administered 2020-11-25 – 2020-12-01 (×6): 500 mg via ORAL
  Filled 2020-11-25 (×6): qty 1

## 2020-11-25 MED ORDER — CALCIUM ACETATE (PHOS BINDER) 667 MG PO CAPS
1334.0000 mg | ORAL_CAPSULE | Freq: Three times a day (TID) | ORAL | Status: DC
  Administered 2020-11-25 – 2020-12-01 (×18): 1334 mg via ORAL
  Filled 2020-11-25 (×19): qty 2

## 2020-11-25 MED ORDER — METHOCARBAMOL 500 MG PO TABS
500.0000 mg | ORAL_TABLET | Freq: Three times a day (TID) | ORAL | Status: DC | PRN
  Administered 2020-11-25: 500 mg via ORAL
  Filled 2020-11-25: qty 1

## 2020-11-25 MED ORDER — MUPIROCIN 2 % EX OINT
TOPICAL_OINTMENT | Freq: Two times a day (BID) | CUTANEOUS | Status: DC
  Administered 2020-11-26: 1 via NASAL
  Filled 2020-11-25 (×2): qty 22

## 2020-11-25 MED ORDER — AMLODIPINE BESYLATE 10 MG PO TABS
10.0000 mg | ORAL_TABLET | Freq: Every day | ORAL | Status: DC
  Administered 2020-11-27 – 2020-11-30 (×4): 10 mg via ORAL
  Filled 2020-11-25 (×4): qty 1

## 2020-11-25 MED ORDER — AMLODIPINE BESYLATE 5 MG PO TABS
5.0000 mg | ORAL_TABLET | Freq: Every day | ORAL | Status: DC
  Administered 2020-11-25: 5 mg via ORAL
  Filled 2020-11-25: qty 1

## 2020-11-25 NOTE — Progress Notes (Addendum)
PROGRESS NOTE        PATIENT DETAILS Name: [name] Age: 50 y.o. Sex: female Date of Birth: 1970/06/27 Admit Date: 11/21/2020 Admitting Physician Jacqlyn Krauss, MD FAO:[id number], Babs Bertin, PA-C  Brief Narrative: Patient is a 50 y.o. female with history of DM-1, diabetic retinopathy, chronic left foot osteomyelitis, ESRD on HD-recent history of MRSA bacteremia-presented with acute metabolic encephalopathy, DKA and left diabetic foot with osteomyelitis.  On 8/25-her encephalopathy finally improved-but was found to have bilateral visual loss which initial concern for seizure like activity on EEG-but lately felt to be due to BP/CBG fluctuation.  See below for further details.  Significant events: 7/14>> nausea/vomiting-left foot cellulitis-evaluated at urgent care in [place] on 7/14-blood cultures positive for MRSA.  Declined admission.  Subsequently received IV vancomycin with HD from 7/26-8/04 8/22>> presented to [facility] ED with confusion-found to have DKA 8/25>> finally more awake and alert-claims she cannot see out of both eyes.  Spot EEG w discharge from left hemisphere-EEG improved with Ativan 2 mg x 1  Significant studies: 8/22>> x-ray left foot: Osteomyelitis at the second phalange 8/22>> CXR: No pneumonia 8/22>> CT head: No acute intracranial abnormality 8/24>> CT maxillofacial area: No acute infection 8/25>> EEG: Generalized periodic discharges from left hemisphere-given 2 mg of IV Ativan-EEG improved. 8/25>> Echo: EF 60-65%-no obvious vegetations. 8/25>> MRI brain: No acute intracranial pathology 8/25-8/26>> LTM EEG: Cortical dysfunction in the left posterior quadrant-nonspecific etiology-no seizures or definite epileptiform discharges seen.  Antimicrobial therapy: Vancomycin: 8/22>> Cefepime: 8/22>>8/23  Microbiology data: 8/22>> COVID/influenza PCR: Negative 8/22>> blood culture: Negative so far 8/24>> blood culture: Negative so  far  Procedures : None  Consults: ID, nephrology, podiatry, neurology,opthalmology  DVT Prophylaxis : SCDs Start: 11/21/20 1037   Subjective: She is much more awake this morning-yesterday she received 2 mg of IV Ativan following which she was sedated most of the day.  This morning-she is awake/alert and following commands.  She continues to claim to have visual loss in both eyes-she does not blink when startled   Assessment/Plan: Acute metabolic encephalopathy: Multifactorial etiology-DKA/BUN elevation/possible seizures-mentation has improved with supportive care.     ?  Seizures: Initial spot EEG was suspicious for possible seizures-however LTM EEG was negative for seizures.  Neurology recommending to hold off on starting antiepileptics.  Bilateral visual loss: No clear-cut etiology identified-no pathology evident on ocular exam by ophthalmologist.  No lesion seen on MRI brain.  Recommendations from neurology are to continue to optimize sugar/BP control and reassess in a few days.  Patient does have known history of diabetic retinopathy (20/70 on right and 20/50 on left-history of laser procedures/and eye injections every 8 weeks per primary ophthalmologist).  Initially there was concern that whether this was related to possible seizures-but per neurology-unlikely related to EEG findings.  Sepsis due to acute osteomyelitis involving the left second phalanx-possibility of underlying MRSA infection: Sepsis physiology has improved.  Per ID note-patient had positive blood cultures with MRSA in [place] on 7/14-it appears that she declined admission and apparently received treatment with IV vancomycin with hemodialysis from 7/26-8/4.  Evaluated by podiatry on 8/23-she appears to have more of a chronic osteomyelitis rather than a acute osteomyelitis. Blood cultures on 8/22 negative so far-transthoracic echo without any vegetations.  ID recommending 4 weeks of vancomycin with HD-end date of  12/19/2020.  Facial swelling: Etiology felt to be  either due to volume overload-improved after fluid removal with HD on 8/24.  CT maxillofacial area does not show any acute infection.    DKA history of DM 1 on insulin pump (A1c 8.1 on 8/3): DKA resolved with IV insulin-CBGs currently stable with 7 units of Lantus and SSI.  Once she is a bit more awake and alert-and diet is stable-we can consider putting her back on her insulin pump.   Recent Labs    11/24/20 2052 11/25/20 0753 11/25/20 1156  GLUCAP 127* 106* 147*     Diarrhea:?  Etiology-unclear if patient has bowel related autonomic neuropathy in the setting of diabetes.  C. difficile studies and GI pathogen panel negative.  Will use prn Imodium.  ESRD: Nephrology following-we will defer HD to nephrology.  Normocytic anemia: Due to ESRD-defer need for EPO/IV iron to nephrology.  HLD: Resume statin when able  History of right transmetatarsal amputation  Diet: Diet Order             Diet NPO time specified  Diet effective now                    Code Status: Full code or DNR  Family Communication: Spoke with sister-[name] over phone on 8/26  Disposition Plan: Status is: Inpatient  Remains inpatient appropriate because:Inpatient level of care appropriate due to severity of illness  Dispo: The patient is from: Home              Anticipated d/c is to: Home              Patient currently is not medically stable to d/c.   Difficult to place patient No   Barriers to Discharge: Resolving encephalopathy-now with seizure disorder-blindness-work-up in progress.  See above.    Antimicrobial agents: Anti-infectives (From admission, onward)    Start     Dose/Rate Route Frequency Ordered Stop   11/23/20 1800  ceFEPIme (MAXIPIME) 1 g in sodium chloride 0.9 % 100 mL IVPB  Status:  Discontinued        1 g 200 mL/hr over 30 Minutes Intravenous Daily-1800 11/23/20 1126 11/23/20 1145   11/23/20 1030   vancomycin (VANCOREADY) IVPB 750 mg/150 mL        750 mg 150 mL/hr over 60 Minutes Intravenous To Hemodialysis 11/23/20 1021 11/23/20 1305   11/23/20 1030  ceFEPIme (MAXIPIME) 2 g in sodium chloride 0.9 % 100 mL IVPB  Status:  Discontinued        2 g 200 mL/hr over 30 Minutes Intravenous To Hemodialysis 11/23/20 1021 11/23/20 1125   11/22/20 1800  ceFEPIme (MAXIPIME) 2 g in sodium chloride 0.9 % 100 mL IVPB  Status:  Discontinued        2 g 200 mL/hr over 30 Minutes Intravenous Every T-Th-Sa (Hemodialysis) 11/21/20 2018 11/22/20 0816   11/22/20 1800  ceFEPIme (MAXIPIME) 2 g in sodium chloride 0.9 % 100 mL IVPB  Status:  Discontinued        2 g 200 mL/hr over 30 Minutes Intravenous Every T-Th-Sa (1800) 11/22/20 0816 11/23/20 1125   11/22/20 1500  vancomycin (VANCOREADY) IVPB 750 mg/150 mL        750 mg 150 mL/hr over 60 Minutes Intravenous Every T-Th-Sa (Hemodialysis) 11/22/20 1357     11/22/20 1000  ceFEPIme (MAXIPIME) 2 g in sodium chloride 0.9 % 100 mL IVPB  Status:  Discontinued       Note to Pharmacy: Pharmacy to dose   2 g 200  mL/hr over 30 Minutes Intravenous Every 24 hours 11/21/20 1035 11/21/20 2017   11/22/20 0435  vancomycin variable dose per unstable renal function (pharmacist dosing)  Status:  Discontinued         Does not apply See admin instructions 11/22/20 0435 11/23/20 1022   11/21/20 0700  vancomycin (VANCOCIN) IVPB 1000 mg/200 mL premix        1,000 mg 200 mL/hr over 60 Minutes Intravenous  Once 11/21/20 0648 11/21/20 1247   11/21/20 0700  ceFEPIme (MAXIPIME) 2 g in sodium chloride 0.9 % 100 mL IVPB        2 g 200 mL/hr over 30 Minutes Intravenous  Once 11/21/20 0656 11/21/20 1022   11/21/20 0645  metroNIDAZOLE (FLAGYL) IVPB 500 mg        500 mg 100 mL/hr over 60 Minutes Intravenous  Once 11/21/20 0636 11/21/20 0816        Time spent: 35 minutes-Greater than 50% of this time was spent in counseling, explanation of diagnosis, planning of further management, and  coordination of care.  MEDICATIONS: Scheduled Meds:  Chlorhexidine Gluconate Cloth  6 each Topical Q0600   collagenase   Topical Daily   insulin aspart  0-6 Units Subcutaneous TID WC   insulin glargine-yfgn  7 Units Subcutaneous Daily   mupirocin ointment   Nasal BID   Continuous Infusions:  vancomycin     PRN Meds:.dextrose, haloperidol lactate, hydrALAZINE, methocarbamol, ondansetron (ZOFRAN) IV   PHYSICAL EXAM: Vital signs: Vitals:   11/24/20 2340 11/25/20 0358 11/25/20 0744 11/25/20 1146  BP: (!) 164/83 (!) (P) 189/89 (!) 212/103 (!) 156/100  Pulse: 76 (P) 75 87 84  Resp: 16 (P) 16 13 12   Temp: (!) 97.2 F (36.2 C) (P) 97.8 F (36.6 C) 98.2 F (36.8 C) 97.6 F (36.4 C)  TempSrc: Axillary (P) Axillary Oral Oral  SpO2: 98% (P) 99% 96% 99%  Weight:      Height:       Filed Weights   11/21/20 0600 11/23/20 0949 11/23/20 1347  Weight: 60.8 kg 61.1 kg 57.6 kg   Body mass index is 19.89 kg/m.   Gen Exam: Much more awake and alert-does not bring to a sudden startle reflex HEENT:atraumatic, normocephalic Chest: B/L clear to auscultation anteriorly CVS:S1S2 regular Abdomen:soft non tender, non distended Extremities:no edema Neurology: Non focal-moving all 4 extremities. Skin: no rash   I have personally reviewed following labs and imaging studies  LABORATORY DATA: CBC: Recent Labs  Lab 11/21/20 0550 11/21/20 1227 11/22/20 0714 11/22/20 1221 11/23/20 0141 11/24/20 0200 11/25/20 0811  WBC 10.3 7.8 8.9 6.4 7.9 7.5 5.1  NEUTROABS 8.6* 6.0  --   --   --   --   --   HGB 11.0* 10.2* 9.5* 10.1* 9.6* 10.6* 11.1*  HCT 32.8* 30.6* 28.1* 29.6* 28.6* 32.8* 34.3*  MCV 89.4 88.4 88.6 86.8 88.8 91.9 91.0  PLT 246 226 215 208 193 230 274     Basic Metabolic Panel: Recent Labs  Lab 11/22/20 1944 11/22/20 2218 11/23/20 0735 11/24/20 0200 11/25/20 0811  NA 129* 130* 130* 136 137  K 5.1 5.1 4.9 3.7 4.2  CL 94* 94* 95* 99 100  CO2 14* 18* 16* 16* 20*  GLUCOSE  176* 112* 140* 160* 103*  BUN 76* 78* 79* 31* 39*  CREATININE 9.69* 9.81* 9.95* 6.35* 9.07*  CALCIUM 8.3* 8.3* 8.2* 8.4* 8.0*  PHOS  --   --  6.0* 4.8* 6.7*     GFR: Estimated Creatinine Clearance:  6.8 mL/min (A) (by C-G formula based on SCr of 9.07 mg/dL (H)).  Liver Function Tests: Recent Labs  Lab 11/21/20 0550 11/23/20 0735 11/24/20 0200 11/25/20 0811  AST 28 26  --   --   ALT 45* 32  --   --   ALKPHOS 242* 156*  --   --   BILITOT 0.7 0.9  --   --   PROT 8.1 6.6  --   --   ALBUMIN 3.9 3.0*  3.1* 3.0* 2.8*    Recent Labs  Lab 11/21/20 0550  LIPASE 40    Recent Labs  Lab 11/23/20 0852  AMMONIA 51*     Coagulation Profile: Recent Labs  Lab 11/21/20 0550  INR 1.0     Cardiac Enzymes: No results for input(s): CKTOTAL, CKMB, CKMBINDEX, TROPONINI in the last 168 hours.  BNP (last 3 results) No results for input(s): PROBNP in the last 8760 hours.  Lipid Profile: No results for input(s): CHOL, HDL, LDLCALC, TRIG, CHOLHDL, LDLDIRECT in the last 72 hours.  Thyroid Function Tests: No results for input(s): TSH, T4TOTAL, FREET4, T3FREE, THYROIDAB in the last 72 hours.  Anemia Panel: No results for input(s): VITAMINB12, FOLATE, FERRITIN, TIBC, IRON, RETICCTPCT in the last 72 hours.  Urine analysis:    Component Value Date/Time   COLORURINE YELLOW 11/23/2020 0410   APPEARANCEUR CLEAR 11/23/2020 0410   LABSPEC 1.012 11/23/2020 0410   PHURINE 7.0 11/23/2020 0410   GLUCOSEU >=500 (A) 11/23/2020 0410   GLUCOSEU 500 (A) 01/26/2015 1200   HGBUR NEGATIVE 11/23/2020 0410   BILIRUBINUR NEGATIVE 11/23/2020 0410   KETONESUR 5 (A) 11/23/2020 0410   PROTEINUR 100 (A) 11/23/2020 0410   UROBILINOGEN 0.2 01/26/2015 1200   NITRITE NEGATIVE 11/23/2020 0410   LEUKOCYTESUR NEGATIVE 11/23/2020 0410    Sepsis Labs: Lactic Acid, Venous    Component Value Date/Time   LATICACIDVEN 1.6 11/21/2020 1227    MICROBIOLOGY: Recent Results (from the past 240 hour(s))   Blood culture (routine single)     Status: None (Preliminary result)   Collection Time: 11/21/20  5:50 AM   Specimen: BLOOD  Result Value Ref Range Status   Specimen Description   Final    BLOOD SITE NOT SPECIFIED Performed at [facility] [address], [place], [place] [address]    Special Requests   Final    BOTTLES DRAWN AEROBIC AND ANAEROBIC Blood Culture adequate volume Performed at [facility] [address] [place], [place] [address]    Culture   Final    NO GROWTH 4 DAYS Performed at [facility], [place] [address] [place],  [address]    Report Status PENDING  Incomplete  Resp Panel by RT-PCR (Flu A&B, Covid) Nasopharyngeal Swab     Status: None   Collection Time: 11/21/20  8:11 AM   Specimen: Nasopharyngeal Swab; Nasopharyngeal(NP) swabs in vial transport medium  Result Value Ref Range Status   SARS Coronavirus 2 by RT PCR NEGATIVE NEGATIVE Final    Comment: (NOTE) SARS-CoV-2 target nucleic acids are NOT DETECTED.  The SARS-CoV-2 RNA is generally detectable in upper respiratory specimens during the acute phase of infection. The lowest concentration of SARS-CoV-2 viral copies this assay can detect is 138 copies/mL. A negative result does not preclude SARS-Cov-2 infection and should not be used as the sole basis for treatment or other patient management decisions. A negative result may occur with  improper specimen collection/handling, submission of specimen other than nasopharyngeal swab, presence  of viral mutation(s) within the areas targeted by this assay, and inadequate number of viral copies(<138 copies/mL). A negative result must be combined with clinical observations, patient history, and epidemiological information. The expected result is Negative.  Fact Sheet for Patients:  EntrepreneurPulse.com.au  Fact Sheet for Healthcare Providers:  IncredibleEmployment.be  This  test is no t yet approved or cleared by the [place] FDA and  has been authorized for detection and/or diagnosis of SARS-CoV-2 by FDA under an Emergency Use Authorization (EUA). This EUA will remain  in effect (meaning this test can be used) for the duration of the COVID-19 declaration under Section 564(b)(1) of the Act, 21 U.S.C.section 360bbb-3(b)(1), unless the authorization is terminated  or revoked sooner.       Influenza A by PCR NEGATIVE NEGATIVE Final   Influenza B by PCR NEGATIVE NEGATIVE Final    Comment: (NOTE) The Xpert Xpress SARS-CoV-2/FLU/RSV plus assay is intended as an aid in the diagnosis of influenza from Nasopharyngeal swab specimens and should not be used as a sole basis for treatment. Nasal washings and aspirates are unacceptable for Xpert Xpress SARS-CoV-2/FLU/RSV testing.  Fact Sheet for Patients: EntrepreneurPulse.com.au  Fact Sheet for Healthcare Providers: IncredibleEmployment.be  This test is not yet approved or cleared by the [place] FDA and has been authorized for detection and/or diagnosis of SARS-CoV-2 by FDA under an Emergency Use Authorization (EUA). This EUA will remain in effect (meaning this test can be used) for the duration of the COVID-19 declaration under Section 564(b)(1) of the Act, 21 U.S.C. section 360bbb-3(b)(1), unless the authorization is terminated or revoked.  Performed at [facility], [place] [address], [place], [place] [address]   Culture, blood (single)     Status: None (Preliminary result)   Collection Time: 11/21/20  3:45 PM   Specimen: BLOOD RIGHT HAND  Result Value Ref Range Status   Specimen Description BLOOD RIGHT HAND  Final   Special Requests IN PEDIATRIC BOTTLE Blood Culture adequate volume  Final   Culture   Final    NO GROWTH 4 DAYS Performed at [facility], [place] [address], [place], [place] [address]    Report Status PENDING  Incomplete   MRSA Next Gen by PCR, Nasal     Status: Abnormal   Collection Time: 11/22/20 10:57 AM   Specimen: Nasal Mucosa; Nasal Swab  Result Value Ref Range Status   MRSA by PCR Next Gen DETECTED (A) NOT DETECTED Final    Comment: RESULT CALLED TO, READ BACK BY AND VERIFIED WITH: Maryan Puls RN 11/22/2020 @1417  BY JW (NOTE) The GeneXpert MRSA Assay (FDA approved for NASAL specimens only), is one component of a comprehensive MRSA colonization surveillance program. It is not intended to diagnose MRSA infection nor to guide or monitor treatment for MRSA infections. Test performance is not FDA approved in patients less than 29 years old. Performed at [facility], [place] [address] [place], [place] [address]   C Difficile Quick Screen w PCR reflex     Status: None   Collection Time: 11/22/20  4:33 PM   Specimen: STOOL  Result Value Ref Range Status   C Diff antigen NEGATIVE NEGATIVE Final   C Diff toxin NEGATIVE NEGATIVE Final   C Diff interpretation No C. difficile detected.  Final    Comment: Performed at [facility], [place] [address] [place], [place] [address]  Gastrointestinal Panel by PCR , Stool     Status: None   Collection  Time: 11/22/20  4:33 PM   Specimen: STOOL  Result Value Ref Range Status   Campylobacter species NOT DETECTED NOT DETECTED Final   Plesimonas shigelloides NOT DETECTED NOT DETECTED Final   Salmonella species NOT DETECTED NOT DETECTED Final   Yersinia enterocolitica NOT DETECTED NOT DETECTED Final   Vibrio species NOT DETECTED NOT DETECTED Final   Vibrio cholerae NOT DETECTED NOT DETECTED Final   Enteroaggregative E coli (EAEC) NOT DETECTED NOT DETECTED Final   Enteropathogenic E coli (EPEC) NOT DETECTED NOT DETECTED Final   Enterotoxigenic E coli (ETEC) NOT DETECTED NOT DETECTED Final   Shiga like toxin producing E coli (STEC) NOT DETECTED NOT DETECTED Final   Shigella/Enteroinvasive E coli (EIEC) NOT DETECTED NOT DETECTED Final   Cryptosporidium NOT  DETECTED NOT DETECTED Final   Cyclospora cayetanensis NOT DETECTED NOT DETECTED Final   Entamoeba histolytica NOT DETECTED NOT DETECTED Final   Giardia lamblia NOT DETECTED NOT DETECTED Final   Adenovirus F40/41 NOT DETECTED NOT DETECTED Final   Astrovirus NOT DETECTED NOT DETECTED Final   Norovirus GI/GII NOT DETECTED NOT DETECTED Final   Rotavirus A NOT DETECTED NOT DETECTED Final   Sapovirus (I, II, IV, and V) NOT DETECTED NOT DETECTED Final    Comment: Performed at [facility], [address] [place], [place] [address]  Culture, blood (routine x 2)     Status: None (Preliminary result)   Collection Time: 11/23/20  3:26 PM   Specimen: BLOOD RIGHT HAND  Result Value Ref Range Status   Specimen Description BLOOD RIGHT HAND  Final   Special Requests   Final    BOTTLES DRAWN AEROBIC AND ANAEROBIC Blood Culture results may not be optimal due to an excessive volume of blood received in culture bottles   Culture   Final    NO GROWTH 2 DAYS Performed at [facility], [address] [place], [place] [address]    Report Status PENDING  Incomplete  Culture, blood (routine x 2)     Status: None (Preliminary result)   Collection Time: 11/23/20  3:27 PM   Specimen: BLOOD RIGHT HAND  Result Value Ref Range Status   Specimen Description BLOOD RIGHT HAND  Final   Special Requests   Final    BOTTLES DRAWN AEROBIC AND ANAEROBIC Blood Culture results may not be optimal due to an excessive volume of blood received in culture bottles   Culture   Final    NO GROWTH 2 DAYS Performed at [facility], [place] [address], [place], [place] [address]    Report Status PENDING  Incomplete    RADIOLOGY STUDIES/RESULTS: DG Abd 1 View  Result Date: 11/24/2020 CLINICAL DATA:  Evaluate for metallic foreign body EXAM: ABDOMEN - 1 VIEW COMPARISON:  None. FINDINGS: Scattered large and small bowel gas is noted. No abnormal mass or abnormal calcifications are seen. Belly button ring is noted.  No bony abnormality is noted. Scattered vascular calcifications are noted. IMPRESSION: Umbilical ring is noted. No other focal abnormality is noted. Electronically Signed   By: Inez Catalina M.D.   On: 11/24/2020 03:45   MR BRAIN WO CONTRAST  Result Date: 11/24/2020 CLINICAL DATA:  Altered mental status, metabolic encephalopathy EXAM: MRI HEAD WITHOUT CONTRAST TECHNIQUE: Multiplanar, multiecho pulse sequences of the brain and surrounding structures were obtained without intravenous contrast. COMPARISON:  CT head 11/21/2020 FINDINGS: Brain: There is no evidence of acute intracranial hemorrhage, extra-axial fluid collection, or infarct. There is no focal parenchymal signal abnormality.  There is mild global parenchymal volume loss. The ventricles are enlarged. No mass lesion is identified. There is no midline shift. Vascular: Normal flow voids. Skull and upper cervical spine: Normal marrow signal. Sinuses/Orbits: The paranasal sinuses are clear. Bilateral lens implants are in place. The bones and orbits are otherwise unremarkable. Other: There are bilateral mastoid effusions. IMPRESSION: 1. No acute intracranial pathology. 2. Bilateral mastoid effusions. Electronically Signed   By: Valetta Mole M.D.   On: 11/24/2020 11:52   CT MAXILLOFACIAL W CONTRAST  Result Date: 11/23/2020 CLINICAL DATA:  Facial cellulitis EXAM: CT MAXILLOFACIAL WITH CONTRAST TECHNIQUE: Multidetector CT imaging of the maxillofacial structures was performed with intravenous contrast. Multiplanar CT image reconstructions were also generated. CONTRAST:  58mL OMNIPAQUE IOHEXOL 300 MG/ML  SOLN COMPARISON:  CT head 11/21/2020 FINDINGS: Osseous: Periapical lucency around left lower molar compatible with infection which may be chronic. No fracture. Orbits: Bilateral cataract extraction.  No orbital mass or edema. Sinuses: Mucosal edema right maxillary sinus. Remaining sinuses clear. Mastoid clear bilaterally. Soft tissues: Mild soft tissue edema in  the subcutaneous tissue of the anterior neck and lateral to the mandible bilaterally. No soft tissue abscess identified. Limited intracranial: No acute abnormality IMPRESSION: Mild soft tissue swelling in the anterior neck bilaterally, nonspecific in appearance. Possible edema or cellulitis. No soft tissue abscess. Periapical lucency around left lower molar consistent with dental infection Electronically Signed   By: Franchot Gallo M.D.   On: 11/23/2020 17:14   Adult EEG prolonged 41-60 minutes  Result Date: 11/24/2020 Lora Havens, MD     11/24/2020  3:14 PM Patient Name: [name] MRN: [id number] Epilepsy Attending: Lora Havens Referring Physician/Provider: Dr Oren Binet Date: 11/24/2020 Duration: 43.03 mins Patient history: 50 year old female admitted with DKA, MRSA bacteremia and had sudden worsening of vision.  EEG to evaluate for seizures. Level of alertness: Awake, asleep AEDs during EEG study: None Technical aspects: This EEG study was done with scalp electrodes positioned according to the 10-20 International system of electrode placement. Electrical activity was acquired at a sampling rate of 500Hz  and reviewed with a high frequency filter of 70Hz  and a low frequency filter of 1Hz . EEG data were recorded continuously and digitally stored. Description: During awake state, no clear posterior dominant rhythm was seen.  EEG showed continuous 3 to 5 Hz theta-delta slowing.  There was also generalized and maximal left hemisphere periodic epileptiform discharges with triphasic morphology at 1.5 Hz which at times appeared rhythmic without definite evolution.  IV Ativan was administered on 11/24/2020 at 0 932 after which EEG showed sleep spindles (12 to 14 Hz), maximal frontocentral region as well as intermittent generalized 3 to 5 Hz theta and delta slowing.  Hyperventilation and photic stimulation were not performed.   ABNORMALITY - Periodic discharges with triphasic morphology, generalized  and lateralized left hemisphere ( GPDs) - Continuous slow, generalized IMPRESSION: This study initially showed generalized periodic discharges with triphasic morphology at 1.5 Hz which at times appeared rhythmic.  This EEG pattern is on the ictal and interictal continuum and can also be seen due to toxic-metabolic causes.  IV Ativan 2 mg was administered after which patient fell asleep and EEG pattern improved.  Improvement with benzodiazepine is not necessarily diagnostic of ictal nature.  Therefore, would recommend prolonged EEG monitoring for further evaluation of this EEG pattern. Lora Havens    ECHOCARDIOGRAM COMPLETE  Result Date: 11/24/2020    ECHOCARDIOGRAM REPORT   Patient Name:   [name] Date  of Exam: 11/24/2020 Medical Rec #:  [id number]          Height:       67.0 in Accession #:    [id number]         Weight:       127.0 lb Date of Birth:  July 23, 1970          BSA:          1.667 m Patient Age:    56 years           BP:           150/70 mmHg Patient Gender: F                  HR:           100 bpm. Exam Location:  Inpatient Procedure: 2D Echo, Cardiac Doppler and Color Doppler Indications:    Bacteremia  History:        Patient has prior history of Echocardiogram examinations, most                 recent 08/06/2018. Risk Factors:Hypertension.  Sonographer:    Bernadene Person RDCS Referring Phys: [id number] [place]  1. Left ventricular ejection fraction, by estimation, is 60 to 65%. The left ventricle has normal function. The left ventricle has no regional wall motion abnormalities. Left ventricular diastolic parameters were normal.  2. Right ventricular systolic function is normal. The right ventricular size is normal. There is mildly elevated pulmonary artery systolic pressure.  3. The mitral valve is normal in structure. No evidence of mitral valve regurgitation. No evidence of mitral stenosis.  4. The aortic valve is tricuspid. Aortic valve regurgitation is not  visualized. No aortic stenosis is present.  5. The inferior vena cava is normal in size with greater than 50% respiratory variability, suggesting right atrial pressure of 3 mmHg. FINDINGS  Left Ventricle: Left ventricular ejection fraction, by estimation, is 60 to 65%. The left ventricle has normal function. The left ventricle has no regional wall motion abnormalities. The left ventricular internal cavity size was normal in size. There is  no left ventricular hypertrophy. Left ventricular diastolic parameters were normal. Right Ventricle: The right ventricular size is normal. Right ventricular systolic function is normal. There is mildly elevated pulmonary artery systolic pressure. The tricuspid regurgitant velocity is 2.99 m/s, and with an assumed right atrial pressure of 3 mmHg, the estimated right ventricular systolic pressure is 00.3 mmHg. Left Atrium: Left atrial size was normal in size. Right Atrium: Right atrial size was normal in size. Pericardium: Trivial pericardial effusion is present. Mitral Valve: The mitral valve is normal in structure. Mild mitral annular calcification. No evidence of mitral valve regurgitation. No evidence of mitral valve stenosis. Tricuspid Valve: The tricuspid valve is normal in structure. Tricuspid valve regurgitation is mild . No evidence of tricuspid stenosis. Aortic Valve: The aortic valve is tricuspid. Aortic valve regurgitation is not visualized. No aortic stenosis is present. Pulmonic Valve: The pulmonic valve was normal in structure. Pulmonic valve regurgitation is not visualized. No evidence of pulmonic stenosis. Aorta: The aortic root is normal in size and structure. Venous: The inferior vena cava is normal in size with greater than 50% respiratory variability, suggesting right atrial pressure of 3 mmHg. IAS/Shunts: No atrial level shunt detected by color flow Doppler.  LEFT VENTRICLE PLAX 2D LVIDd:         4.30 cm  Diastology LVIDs:  2.70 cm  LV e' medial:  8.00  cm/s LV PW:         1.10 cm  LV e' lateral: 9.74 cm/s LV IVS:        1.00 cm LVOT diam:     1.60 cm LV SV:         51 LV SV Index:   30 LVOT Area:     2.01 cm  RIGHT VENTRICLE RV S prime:     12.00 cm/s TAPSE (M-mode): 2.3 cm LEFT ATRIUM             Index       RIGHT ATRIUM           Index LA diam:        3.30 cm 1.98 cm/m  RA Area:     11.30 cm LA Vol (A2C):   35.8 ml 21.47 ml/m RA Volume:   23.30 ml  13.98 ml/m LA Vol (A4C):   39.3 ml 23.57 ml/m LA Biplane Vol: 38.1 ml 22.85 ml/m  AORTIC VALVE LVOT Vmax:   137.00 cm/s LVOT Vmean:  92.600 cm/s LVOT VTI:    0.252 m  AORTA Ao Root diam: 2.90 cm Ao Asc diam:  3.30 cm TRICUSPID VALVE TR Peak grad:   35.8 mmHg TR Vmax:        299.00 cm/s  SHUNTS Systemic VTI:  0.25 m Systemic Diam: 1.60 cm Kirk Ruths MD Electronically signed by Kirk Ruths MD Signature Date/Time: 11/24/2020/3:20:12 PM    Final      LOS: 4 days   Oren Binet, MD  [facility]    To contact the attending provider between 7A-7P or the covering provider during after hours 7P-7A, please log into the web site [email or web address] and access using universal [place] password for that web site. If you do not have the password, please call the hospital operator.  11/25/2020, 2:12 PM

## 2020-11-25 NOTE — Progress Notes (Signed)
Pharmacy Antibiotic Note  Nancy MYSZKA is a 50 y.o. female admitted on 11/21/2020 with  osteomyelitis .  Pharmacy has been consulted for vancomycin dosing.  Metronidazole 500 mg IV 8/22 > 8/22 Cefepime 2 gm IV 8/22 > 8/23 Vanc 8/22 >>  8/24 Bcx x2: ngtd 8/23 MRSA PCR: pos 8/23 Cdiff screen: neg 8/23 GI panel: neg 8/22 Bcx: ngtd  Plan: Continue vancomycin 750 mg qTTS after each HD session.  Height: 5\' 7"  (170.2 cm) Weight: 57.6 kg (126 lb 15.8 oz) IBW/kg (Calculated) : 61.6  Temp (24hrs), Avg:97.8 F (36.6 C), Min:97.2 F (36.2 C), Max:98.4 F (36.9 C)  Recent Labs  Lab 11/21/20 0512 11/21/20 0550 11/21/20 1227 11/21/20 1533 11/22/20 0714 11/22/20 1051 11/22/20 1221 11/22/20 1441 11/22/20 1944 11/22/20 2218 11/23/20 0141 11/23/20 0735 11/24/20 0200  WBC  --    < > 7.8  --  8.9  --  6.4  --   --   --  7.9  --  7.5  CREATININE  --    < > 6.91*   < > 9.31*   < >  --  9.29* 9.69* 9.81*  --  9.95* 6.35*  LATICACIDVEN 3.8*  --  1.6  --   --   --   --   --   --   --   --   --   --    < > = values in this interval not displayed.     Estimated Creatinine Clearance: 9.7 mL/min (A) (by C-G formula based on SCr of 6.35 mg/dL (H)).    Allergies  Allergen Reactions   Cleocin [Clindamycin Hcl] Diarrhea   Lisinopril Other (See Comments)    Elevated potassium per pt report   Amoxicillin Diarrhea, Nausea Only and Other (See Comments)    Did it involve swelling of the face/tongue/throat, SOB, or low BP? No Did it involve sudden or severe rash/hives, skin peeling, or any reaction on the inside of your mouth or nose? No Did you need to seek medical attention at a hospital or doctor's office? No When did it last happen?      10 + years If all above answers are "NO", may proceed with cephalosporin use.     Bactrim [Sulfamethoxazole-Trimethoprim] Diarrhea and Nausea Only    Thank you for allowing pharmacy to be a part of this patient's care.  Collene Gobble, Student  Pharmacist

## 2020-11-25 NOTE — Progress Notes (Signed)
[facility] Progress Note   Subjective:   Patient seen and examined at bedside.  Reports painful cramping in LE today. Denies CP, SOB, n/v/d and abdominal pain.   Objective Vitals:   11/24/20 2340 11/25/20 0358 11/25/20 0744 11/25/20 1146  BP: (!) 164/83 (!) (P) 189/89 (!) 212/103 (!) 156/100  Pulse: 76 (P) 75 87 84  Resp: 16 (P) 16 13 12   Temp: (!) 97.2 F (36.2 C) (P) 97.8 F (36.6 C) 98.2 F (36.8 C) 97.6 F (36.4 C)  TempSrc: Axillary (P) Axillary Oral Oral  SpO2: 98% (P) 99% 96% 99%  Weight:      Height:       Physical Exam General:chronically ill appearing female, uncomfortable, but NAD. AAOx3 Heart:RRR, no mrg Lungs:CTAB, nml WOB Abdomen:soft, NTND Extremities:no LE edema, +tenderness Dialysis Access: LU AVF +b/t   Filed Weights   11/21/20 0600 11/23/20 0949 11/23/20 1347  Weight: 60.8 kg 61.1 kg 57.6 kg    Intake/Output Summary (Last 24 hours) at 11/25/2020 1436 Last data filed at 11/24/2020 1800 Gross per 24 hour  Intake --  Output 300 ml  Net -300 ml    Additional Objective Labs: Basic Metabolic Panel: Recent Labs  Lab 11/23/20 0735 11/24/20 0200 11/25/20 0811  NA 130* 136 137  K 4.9 3.7 4.2  CL 95* 99 100  CO2 16* 16* 20*  GLUCOSE 140* 160* 103*  BUN 79* 31* 39*  CREATININE 9.95* 6.35* 9.07*  CALCIUM 8.2* 8.4* 8.0*  PHOS 6.0* 4.8* 6.7*   Liver Function Tests: Recent Labs  Lab 11/21/20 0550 11/23/20 0735 11/24/20 0200 11/25/20 0811  AST 28 26  --   --   ALT 45* 32  --   --   ALKPHOS 242* 156*  --   --   BILITOT 0.7 0.9  --   --   PROT 8.1 6.6  --   --   ALBUMIN 3.9 3.0*  3.1* 3.0* 2.8*   Recent Labs  Lab 11/21/20 0550  LIPASE 40   CBC: Recent Labs  Lab 11/21/20 0550 11/21/20 1227 11/22/20 0714 11/22/20 1221 11/23/20 0141 11/24/20 0200 11/25/20 0811  WBC 10.3 7.8 8.9 6.4 7.9 7.5 5.1  NEUTROABS 8.6* 6.0  --   --   --   --   --   HGB 11.0* 10.2* 9.5* 10.1* 9.6* 10.6* 11.1*  HCT 32.8* 30.6* 28.1* 29.6*  28.6* 32.8* 34.3*  MCV 89.4 88.4 88.6 86.8 88.8 91.9 91.0  PLT 246 226 215 208 193 230 274   Blood Culture    Component Value Date/Time   SDES BLOOD RIGHT HAND 11/23/2020 1527   SPECREQUEST  11/23/2020 1527    BOTTLES DRAWN AEROBIC AND ANAEROBIC Blood Culture results may not be optimal due to an excessive volume of blood received in culture bottles   CULT  11/23/2020 1527    NO GROWTH 2 DAYS Performed at [facility], [place] [address], [place], [place] [address]    REPTSTATUS PENDING 11/23/2020 1527    CBG: Recent Labs  Lab 11/24/20 1157 11/24/20 1621 11/24/20 2052 11/25/20 0753 11/25/20 1156  GLUCAP 256* 122* 127* 106* 147*    Studies/Results: DG Abd 1 View  Result Date: 11/24/2020 CLINICAL DATA:  Evaluate for metallic foreign body EXAM: ABDOMEN - 1 VIEW COMPARISON:  None. FINDINGS: Scattered large and small bowel gas is noted. No abnormal mass or abnormal calcifications are seen. Belly button ring is noted. No bony abnormality is noted. Scattered vascular calcifications are noted.  IMPRESSION: Umbilical ring is noted. No other focal abnormality is noted. Electronically Signed   By: Inez Catalina M.D.   On: 11/24/2020 03:45   MR BRAIN WO CONTRAST  Result Date: 11/24/2020 CLINICAL DATA:  Altered mental status, metabolic encephalopathy EXAM: MRI HEAD WITHOUT CONTRAST TECHNIQUE: Multiplanar, multiecho pulse sequences of the brain and surrounding structures were obtained without intravenous contrast. COMPARISON:  CT head 11/21/2020 FINDINGS: Brain: There is no evidence of acute intracranial hemorrhage, extra-axial fluid collection, or infarct. There is no focal parenchymal signal abnormality. There is mild global parenchymal volume loss. The ventricles are enlarged. No mass lesion is identified. There is no midline shift. Vascular: Normal flow voids. Skull and upper cervical spine: Normal marrow signal. Sinuses/Orbits: The paranasal sinuses are clear. Bilateral lens implants are in  place. The bones and orbits are otherwise unremarkable. Other: There are bilateral mastoid effusions. IMPRESSION: 1. No acute intracranial pathology. 2. Bilateral mastoid effusions. Electronically Signed   By: Valetta Mole M.D.   On: 11/24/2020 11:52   CT MAXILLOFACIAL W CONTRAST  Result Date: 11/23/2020 CLINICAL DATA:  Facial cellulitis EXAM: CT MAXILLOFACIAL WITH CONTRAST TECHNIQUE: Multidetector CT imaging of the maxillofacial structures was performed with intravenous contrast. Multiplanar CT image reconstructions were also generated. CONTRAST:  61mL OMNIPAQUE IOHEXOL 300 MG/ML  SOLN COMPARISON:  CT head 11/21/2020 FINDINGS: Osseous: Periapical lucency around left lower molar compatible with infection which may be chronic. No fracture. Orbits: Bilateral cataract extraction.  No orbital mass or edema. Sinuses: Mucosal edema right maxillary sinus. Remaining sinuses clear. Mastoid clear bilaterally. Soft tissues: Mild soft tissue edema in the subcutaneous tissue of the anterior neck and lateral to the mandible bilaterally. No soft tissue abscess identified. Limited intracranial: No acute abnormality IMPRESSION: Mild soft tissue swelling in the anterior neck bilaterally, nonspecific in appearance. Possible edema or cellulitis. No soft tissue abscess. Periapical lucency around left lower molar consistent with dental infection Electronically Signed   By: Franchot Gallo M.D.   On: 11/23/2020 17:14   Adult EEG prolonged 41-60 minutes  Result Date: 11/24/2020 Lora Havens, MD     11/24/2020  3:14 PM Patient Name: [name] MRN: [id number] Epilepsy Attending: Lora Havens Referring Physician/Provider: Dr Oren Binet Date: 11/24/2020 Duration: 43.03 mins Patient history: 50 year old female admitted with DKA, MRSA bacteremia and had sudden worsening of vision.  EEG to evaluate for seizures. Level of alertness: Awake, asleep AEDs during EEG study: None Technical aspects: This EEG study was done with  scalp electrodes positioned according to the 10-20 International system of electrode placement. Electrical activity was acquired at a sampling rate of 500Hz  and reviewed with a high frequency filter of 70Hz  and a low frequency filter of 1Hz . EEG data were recorded continuously and digitally stored. Description: During awake state, no clear posterior dominant rhythm was seen.  EEG showed continuous 3 to 5 Hz theta-delta slowing.  There was also generalized and maximal left hemisphere periodic epileptiform discharges with triphasic morphology at 1.5 Hz which at times appeared rhythmic without definite evolution.  IV Ativan was administered on 11/24/2020 at 0 932 after which EEG showed sleep spindles (12 to 14 Hz), maximal frontocentral region as well as intermittent generalized 3 to 5 Hz theta and delta slowing.  Hyperventilation and photic stimulation were not performed.   ABNORMALITY - Periodic discharges with triphasic morphology, generalized and lateralized left hemisphere ( GPDs) - Continuous slow, generalized IMPRESSION: This study initially showed generalized periodic discharges with triphasic morphology at 1.5 Hz which at  times appeared rhythmic.  This EEG pattern is on the ictal and interictal continuum and can also be seen due to toxic-metabolic causes.  IV Ativan 2 mg was administered after which patient fell asleep and EEG pattern improved.  Improvement with benzodiazepine is not necessarily diagnostic of ictal nature.  Therefore, would recommend prolonged EEG monitoring for further evaluation of this EEG pattern. Lora Havens    ECHOCARDIOGRAM COMPLETE  Result Date: 11/24/2020    ECHOCARDIOGRAM REPORT   Patient Name:   OSIRIS CHARLES Date of Exam: 11/24/2020 Medical Rec #:  [id number]          Height:       67.0 in Accession #:    [id number]         Weight:       127.0 lb Date of Birth:  [birth year]          BSA:          1.667 m Patient Age:    50 years           BP:           150/70 mmHg Patient  Gender: F                  HR:           100 bpm. Exam Location:  Inpatient Procedure: 2D Echo, Cardiac Doppler and Color Doppler Indications:    Bacteremia  History:        Patient has prior history of Echocardiogram examinations, most                 recent 08/06/2018. Risk Factors:Hypertension.  Sonographer:    Bernadene Person RDCS Referring Phys: [id number] [place]  1. Left ventricular ejection fraction, by estimation, is 60 to 65%. The left ventricle has normal function. The left ventricle has no regional wall motion abnormalities. Left ventricular diastolic parameters were normal.  2. Right ventricular systolic function is normal. The right ventricular size is normal. There is mildly elevated pulmonary artery systolic pressure.  3. The mitral valve is normal in structure. No evidence of mitral valve regurgitation. No evidence of mitral stenosis.  4. The aortic valve is tricuspid. Aortic valve regurgitation is not visualized. No aortic stenosis is present.  5. The inferior vena cava is normal in size with greater than 50% respiratory variability, suggesting right atrial pressure of 3 mmHg. FINDINGS  Left Ventricle: Left ventricular ejection fraction, by estimation, is 60 to 65%. The left ventricle has normal function. The left ventricle has no regional wall motion abnormalities. The left ventricular internal cavity size was normal in size. There is  no left ventricular hypertrophy. Left ventricular diastolic parameters were normal. Right Ventricle: The right ventricular size is normal. Right ventricular systolic function is normal. There is mildly elevated pulmonary artery systolic pressure. The tricuspid regurgitant velocity is 2.99 m/s, and with an assumed right atrial pressure of 3 mmHg, the estimated right ventricular systolic pressure is 30.1 mmHg. Left Atrium: Left atrial size was normal in size. Right Atrium: Right atrial size was normal in size. Pericardium: Trivial pericardial effusion  is present. Mitral Valve: The mitral valve is normal in structure. Mild mitral annular calcification. No evidence of mitral valve regurgitation. No evidence of mitral valve stenosis. Tricuspid Valve: The tricuspid valve is normal in structure. Tricuspid valve regurgitation is mild . No evidence of tricuspid stenosis. Aortic Valve: The aortic valve is tricuspid. Aortic valve regurgitation is not visualized. No aortic  stenosis is present. Pulmonic Valve: The pulmonic valve was normal in structure. Pulmonic valve regurgitation is not visualized. No evidence of pulmonic stenosis. Aorta: The aortic root is normal in size and structure. Venous: The inferior vena cava is normal in size with greater than 50% respiratory variability, suggesting right atrial pressure of 3 mmHg. IAS/Shunts: No atrial level shunt detected by color flow Doppler.  LEFT VENTRICLE PLAX 2D LVIDd:         4.30 cm  Diastology LVIDs:         2.70 cm  LV e' medial:  8.00 cm/s LV PW:         1.10 cm  LV e' lateral: 9.74 cm/s LV IVS:        1.00 cm LVOT diam:     1.60 cm LV SV:         51 LV SV Index:   30 LVOT Area:     2.01 cm  RIGHT VENTRICLE RV S prime:     12.00 cm/s TAPSE (M-mode): 2.3 cm LEFT ATRIUM             Index       RIGHT ATRIUM           Index LA diam:        3.30 cm 1.98 cm/m  RA Area:     11.30 cm LA Vol (A2C):   35.8 ml 21.47 ml/m RA Volume:   23.30 ml  13.98 ml/m LA Vol (A4C):   39.3 ml 23.57 ml/m LA Biplane Vol: 38.1 ml 22.85 ml/m  AORTIC VALVE LVOT Vmax:   137.00 cm/s LVOT Vmean:  92.600 cm/s LVOT VTI:    0.252 m  AORTA Ao Root diam: 2.90 cm Ao Asc diam:  3.30 cm TRICUSPID VALVE TR Peak grad:   35.8 mmHg TR Vmax:        299.00 cm/s  SHUNTS Systemic VTI:  0.25 m Systemic Diam: 1.60 cm Kirk Ruths MD Electronically signed by Kirk Ruths MD Signature Date/Time: 11/24/2020/3:20:12 PM    Final     Medications:  vancomycin      amLODipine  5 mg Oral Daily   Chlorhexidine Gluconate Cloth  6 each Topical Q0600   collagenase    Topical Daily   insulin aspart  0-6 Units Subcutaneous TID WC   insulin glargine-yfgn  7 Units Subcutaneous Daily   mupirocin ointment   Nasal BID    Dialysis Orders: AF TTS   4h  56kg  2/2 bath Hep none LUA AVF   - hect 9 ug IV tiw     Problem/Plan DKA / type 1 DM -  stabilizing plans per admit. Please do not use LR or NS fluids without renal service approval.  OK to use straight D5W or D10W for BS support as needed.  Hyponatremia/ hyperkalemia resolved- NA 137, K 4.2 ESRD - on HD TTS.  Off schedule this week due to staffing issues and increased patient volume. Plan for HD tomorrow back on schedule.  BP/ volume - BP's elevated.  Amlodipine started, on labetalol and hydralazine IV prn. 4.7 L removed with last HD.  Does not appear volume overloaded.  AMS/visual loss- seen by ophthalmology/neuro, thought to be due to metabolic dearrangements, and glucose/BP fluctuations - per primary L foot osteomyelitis -Plan for 4 weeks vancomycin IV with HD with end date 12/19/20.  ID signed off.  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  No ESA. 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  Phos elevated. Restart binders. Continue VDRA.   Jen Mow, PA-C [facility] 11/25/2020,2:36 PM  LOS: 4 days

## 2020-11-25 NOTE — Progress Notes (Signed)
[name]  Date of Admission:  11/21/2020           Reason for visit: Follow up on MRSA infection  Current antibiotics: Vanc 8/22-pres  Previous antibiotics Cefepime 8/22-8/24  ASSESSMENT:    50 y.o. female admitted with:  Hx of MRSA bacteremia:  Seen at urgent care in [place] on 10/13/20 for sx of nausea and vomiting in setting of toe cellulitis of left foot.  Sent to the ED from there where she was found to have left 3rd toe purulence and erythema up to her midfoot.  Plain films that day negative for OM.  CRP 104.8 and ESR 95 that day.  Sent home with doxycycline for 5 days.  Blood cx that day positive in 2 of 2 sets for MRSA.  She was seen at wound center 7/21 who arranged for vancomycin with HD, however, was only treated 7/26-8/4.  Presenting now with encephalopathy, DKA, elevated BP, and need for HD.  Initial picture concerning for continued MRSA BSI, however, blood cx here NGTD x 4 separate sets, CNS imaging negative, and TTE without evidence of vegetation.  Now her encephalopathy has improved and may have been more 2/2 metabolic derangement and blood pressure fluctuations. Vision loss: seen by ophthalmology yesterday and neurology.  MRI brain negative and sx felt to be likely from BP and glucose fluctuations.  Also has background of DM retinopathy. DKA with type 1 diabetes Osteomyelitis of the second phalanx: Seen by podiatry this admission and has been following with wound care outpatient.  X-ray in July with no findings of bone destruction, however, x-ray this admit with findings consistent with osteomyelitis of the second phalanges.  ESR has improved from 95 on 7/14 down to 42 on 8/24 PAD with previous history of stent placement per prior notes ESRD on HD Previous right TMA Diarrhea: C. difficile and GI panel negative   RECOMMENDATIONS:    Continue vancomycin dosed per pharmacy Will plan for 4 additional weeks at HD to treat her previous  bacteremia and possible new OM (comparing July X-ray to X-ray this admission).  End date 12/19/20 Wound care, glycemic control Will sign off for now, please call as needed   Principal Problem:   MRSA bacteremia Active Problems:   Hypertension   Hypercholesteremia   Osteomyelitis (HCC)   Severe sepsis (HCC)   ESRD (end stage renal disease) on dialysis ([place])   DKA, type 1 (HCC)   Acute metabolic encephalopathy   Hypertensive urgency   DKA (diabetic ketoacidosis) ([place])    MEDICATIONS:    Scheduled Meds:  Chlorhexidine Gluconate Cloth  6 each Topical Q0600   collagenase   Topical Daily   insulin aspart  0-6 Units Subcutaneous TID WC   insulin glargine-yfgn  7 Units Subcutaneous Daily   mupirocin ointment   Nasal BID   Continuous Infusions:  vancomycin     PRN Meds:.dextrose, haloperidol lactate, hydrALAZINE, ondansetron (ZOFRAN) IV  SUBJECTIVE:   24 hour events:  Concern for status epilepticus in setting of vision loss.  Neurology consulted and note yesterday indicates lower suspicion for seizure at this point.  Felt that vision disturbance may be due to BP fluctuations and glucose fluctuations.   Afebrile WBC 5.1 BCx negative x 4 sets MRI brain unremarkable CT maxillofacial unremarkable TTE negative for vegetation  Seen this AM with neurology at bedside.  Continues to have vision loss.  Complaining of significant cramping in her legs and asking for muscle relaxant.  No fevers, chills, headaches.    Review of Systems  All other systems reviewed and are negative.    OBJECTIVE:   Blood pressure (!) 212/103, pulse 87, temperature 98.2 F (36.8 C), temperature source Oral, resp. rate 13, height 5' 7" (1.702 m), weight 57.6 kg, SpO2 96 %. Body mass index is 19.89 kg/m.  Physical Exam Constitutional:      Comments: Lying in bed, uncomfortable appearing.   HENT:     Head: Normocephalic and atraumatic.     Comments: Face less swollen today.  Eyes:     Extraocular  Movements: Extraocular movements intact.     Conjunctiva/sclera: Conjunctivae normal.  Cardiovascular:     Rate and Rhythm: Normal rate and regular rhythm.     Heart sounds: No murmur heard. Pulmonary:     Effort: Pulmonary effort is normal. No respiratory distress.  Abdominal:     General: There is no distension.     Palpations: Abdomen is soft.     Tenderness: There is no abdominal tenderness.  Musculoskeletal:     Cervical back: Normal range of motion and neck supple.     Comments: S/p Right TMA Left foot ulcer chronic.  Skin:    General: Skin is warm and dry.     Findings: No rash.  Neurological:     General: No focal deficit present.     Mental Status: She is oriented to person, place, and time.     Comments: More lucid today and alert.      Lab Results: Lab Results  Component Value Date   WBC 5.1 11/25/2020   HGB 11.1 (L) 11/25/2020   HCT 34.3 (L) 11/25/2020   MCV 91.0 11/25/2020   PLT 274 11/25/2020    Lab Results  Component Value Date   NA 137 11/25/2020   K 4.2 11/25/2020   CO2 20 (L) 11/25/2020   GLUCOSE 103 (H) 11/25/2020   BUN 39 (H) 11/25/2020   CREATININE 9.07 (H) 11/25/2020   CALCIUM 8.0 (L) 11/25/2020   GFRNONAA 5 (L) 11/25/2020   GFRAA 16 (L) 08/08/2018    Lab Results  Component Value Date   ALT 32 11/23/2020   AST 26 11/23/2020   ALKPHOS 156 (H) 11/23/2020   BILITOT 0.9 11/23/2020       Component Value Date/Time   CRP 1.1 (H) 11/23/2020 1143   CRP 17.3 (H) 12/18/2016 1544       Component Value Date/Time   ESRSEDRATE 42 (H) 11/23/2020 1143     I have reviewed the micro and lab results in Epic.  Imaging: DG Abd 1 View  Result Date: 11/24/2020 CLINICAL DATA:  Evaluate for metallic foreign body EXAM: ABDOMEN - 1 VIEW COMPARISON:  None. FINDINGS: Scattered large and small bowel gas is noted. No abnormal mass or abnormal calcifications are seen. Belly button ring is noted. No bony abnormality is noted. Scattered vascular calcifications  are noted. IMPRESSION: Umbilical ring is noted. No other focal abnormality is noted. Electronically Signed   By: Inez Catalina M.D.   On: 11/24/2020 03:45   MR BRAIN WO CONTRAST  Result Date: 11/24/2020 CLINICAL DATA:  Altered mental status, metabolic encephalopathy EXAM: MRI HEAD WITHOUT CONTRAST TECHNIQUE: Multiplanar, multiecho pulse sequences of the brain and surrounding structures were obtained without intravenous contrast. COMPARISON:  CT head 11/21/2020 FINDINGS: Brain: There is no evidence of acute intracranial hemorrhage, extra-axial fluid collection, or infarct. There is no focal parenchymal signal abnormality. There is mild global parenchymal volume loss. The ventricles  are enlarged. No mass lesion is identified. There is no midline shift. Vascular: Normal flow voids. Skull and upper cervical spine: Normal marrow signal. Sinuses/Orbits: The paranasal sinuses are clear. Bilateral lens implants are in place. The bones and orbits are otherwise unremarkable. Other: There are bilateral mastoid effusions. IMPRESSION: 1. No acute intracranial pathology. 2. Bilateral mastoid effusions. Electronically Signed   By: Valetta Mole M.D.   On: 11/24/2020 11:52   CT MAXILLOFACIAL W CONTRAST  Result Date: 11/23/2020 CLINICAL DATA:  Facial cellulitis EXAM: CT MAXILLOFACIAL WITH CONTRAST TECHNIQUE: Multidetector CT imaging of the maxillofacial structures was performed with intravenous contrast. Multiplanar CT image reconstructions were also generated. CONTRAST:  89m OMNIPAQUE IOHEXOL 300 MG/ML  SOLN COMPARISON:  CT head 11/21/2020 FINDINGS: Osseous: Periapical lucency around left lower molar compatible with infection which may be chronic. No fracture. Orbits: Bilateral cataract extraction.  No orbital mass or edema. Sinuses: Mucosal edema right maxillary sinus. Remaining sinuses clear. Mastoid clear bilaterally. Soft tissues: Mild soft tissue edema in the subcutaneous tissue of the anterior neck and lateral to the  mandible bilaterally. No soft tissue abscess identified. Limited intracranial: No acute abnormality IMPRESSION: Mild soft tissue swelling in the anterior neck bilaterally, nonspecific in appearance. Possible edema or cellulitis. No soft tissue abscess. Periapical lucency around left lower molar consistent with dental infection Electronically Signed   By: CFranchot GalloM.D.   On: 11/23/2020 17:14   Adult EEG prolonged 41-60 minutes  Result Date: 11/24/2020 YLora Havens MD     11/24/2020  3:14 PM Patient Name: JJAELIANA LOCOCOMRN: [id number] Attending: PLora HavensReferring Physician/Provider: Dr SOren BinetDate: 11/24/2020 Duration: 43.03 mins Patient history: 50year old female admitted with DKA, MRSA bacteremia and had sudden worsening of vision.  EEG to evaluate for seizures. Level of alertness: Awake, asleep AEDs during EEG study: None Technical aspects: This EEG study was done with scalp electrodes positioned according to the 10-20 International system of electrode placement. Electrical activity was acquired at a sampling rate of 500Hz and reviewed with a high frequency filter of 70Hz and a low frequency filter of 1Hz. EEG data were recorded continuously and digitally stored. Description: During awake state, no clear posterior dominant rhythm was seen.  EEG showed continuous 3 to 5 Hz theta-delta slowing.  There was also generalized and maximal left hemisphere periodic epileptiform discharges with triphasic morphology at 1.5 Hz which at times appeared rhythmic without definite evolution.  IV Ativan was administered on 11/24/2020 at 0 932 after which EEG showed sleep spindles (12 to 14 Hz), maximal frontocentral region as well as intermittent generalized 3 to 5 Hz theta and delta slowing.  Hyperventilation and photic stimulation were not performed.   ABNORMALITY - Periodic discharges with triphasic morphology, generalized and lateralized left hemisphere ( GPDs) - Continuous slow,  generalized IMPRESSION: This study initially showed generalized periodic discharges with triphasic morphology at 1.5 Hz which at times appeared rhythmic.  This EEG pattern is on the ictal and interictal continuum and can also be seen due to toxic-metabolic causes.  IV Ativan 2 mg was administered after which patient fell asleep and EEG pattern improved.  Improvement with benzodiazepine is not necessarily diagnostic of ictal nature.  Therefore, would recommend prolonged EEG monitoring for further evaluation of this EEG pattern. PLora Havens   ECHOCARDIOGRAM COMPLETE  Result Date: 11/24/2020    ECHOCARDIOGRAM REPORT   Patient Name:   JJARROD BODKINSDate of Exam: 11/24/2020 Medical Rec #:  [id number]  Height:       67.0 in Accession #:    [id number]         Weight:       127.0 lb Date of Birth:  1970/05/20          BSA:          1.667 m Patient Age:    71 years           BP:           150/70 mmHg Patient Gender: F                  HR:           100 bpm. Exam Location:  Inpatient Procedure: 2D Echo, Cardiac Doppler and Color Doppler Indications:    Bacteremia  History:        Patient has prior history of Echocardiogram examinations, most                 recent 08/06/2018. Risk Factors:Hypertension.  Sonographer:    Bernadene Person RDCS Referring Phys: [id number] [place]  1. Left ventricular ejection fraction, by estimation, is 60 to 65%. The left ventricle has normal function. The left ventricle has no regional wall motion abnormalities. Left ventricular diastolic parameters were normal.  2. Right ventricular systolic function is normal. The right ventricular size is normal. There is mildly elevated pulmonary artery systolic pressure.  3. The mitral valve is normal in structure. No evidence of mitral valve regurgitation. No evidence of mitral stenosis.  4. The aortic valve is tricuspid. Aortic valve regurgitation is not visualized. No aortic stenosis is present.  5. The inferior vena  cava is normal in size with greater than 50% respiratory variability, suggesting right atrial pressure of 3 mmHg. FINDINGS  Left Ventricle: Left ventricular ejection fraction, by estimation, is 60 to 65%. The left ventricle has normal function. The left ventricle has no regional wall motion abnormalities. The left ventricular internal cavity size was normal in size. There is  no left ventricular hypertrophy. Left ventricular diastolic parameters were normal. Right Ventricle: The right ventricular size is normal. Right ventricular systolic function is normal. There is mildly elevated pulmonary artery systolic pressure. The tricuspid regurgitant velocity is 2.99 m/s, and with an assumed right atrial pressure of 3 mmHg, the estimated right ventricular systolic pressure is 79.1 mmHg. Left [facility]: Left atrial size was normal in size. Right [facility]: Right atrial size was normal in size. Pericardium: Trivial pericardial effusion is present. Mitral Valve: The mitral valve is normal in structure. Mild mitral annular calcification. No evidence of mitral valve regurgitation. No evidence of mitral valve stenosis. Tricuspid Valve: The tricuspid valve is normal in structure. Tricuspid valve regurgitation is mild . No evidence of tricuspid stenosis. Aortic Valve: The aortic valve is tricuspid. Aortic valve regurgitation is not visualized. No aortic stenosis is present. Pulmonic Valve: The pulmonic valve was normal in structure. Pulmonic valve regurgitation is not visualized. No evidence of pulmonic stenosis. Aorta: The aortic root is normal in size and structure. Venous: The inferior vena cava is normal in size with greater than 50% respiratory variability, suggesting right atrial pressure of 3 mmHg. IAS/Shunts: No atrial level shunt detected by color flow Doppler.  LEFT VENTRICLE PLAX 2D LVIDd:         4.30 cm  Diastology LVIDs:         2.70 cm  LV e' medial:  8.00 cm/s LV PW:  1.10 cm  LV e' lateral: 9.74 cm/s LV IVS:         1.00 cm LVOT diam:     1.60 cm LV SV:         51 LV SV Index:   30 LVOT Area:     2.01 cm  RIGHT VENTRICLE RV S prime:     12.00 cm/s TAPSE (M-mode): 2.3 cm LEFT ATRIUM             Index       RIGHT ATRIUM           Index LA diam:        3.30 cm 1.98 cm/m  RA Area:     11.30 cm LA Vol (A2C):   35.8 ml 21.47 ml/m RA Volume:   23.30 ml  13.98 ml/m LA Vol (A4C):   39.3 ml 23.57 ml/m LA Biplane Vol: 38.1 ml 22.85 ml/m  AORTIC VALVE LVOT Vmax:   137.00 cm/s LVOT Vmean:  92.600 cm/s LVOT VTI:    0.252 m  AORTA Ao Root diam: 2.90 cm Ao Asc diam:  3.30 cm TRICUSPID VALVE TR Peak grad:   35.8 mmHg TR Vmax:        299.00 cm/s  SHUNTS Systemic VTI:  0.25 m Systemic Diam: 1.60 cm Kirk Ruths MD Electronically signed by Kirk Ruths MD Signature Date/Time: 11/24/2020/3:20:12 PM    Final        [name] for Infectious Disease [place] [phone] pager 11/25/2020, 11:46 AM  I spent greater than 35 minutes with the patient including greater than 50% of time in face to face counsel of the patient and in coordination of their care.

## 2020-11-25 NOTE — Progress Notes (Signed)
LTM maint complete - no skin breakdown under: FP1 F3 F7 [facility] monitored, Event button test confirmed by [facility].

## 2020-11-25 NOTE — Procedures (Addendum)
Patient Name: [name]  MRN: [id number]  Epilepsy Attending: Lora Havens  Referring Physician/Provider: Dr Zeb Comfort Duration: 11/24/2020 1222 to 11/25/2020 1019   Patient history: 50 year old female admitted with DKA, MRSA bacteremia and had sudden worsening of vision.  EEG to evaluate for seizures.   Level of alertness: Awake, asleep   AEDs during EEG study: None   Technical aspects: This EEG study was done with scalp electrodes positioned according to the 10-20 International system of electrode placement. Electrical activity was acquired at a sampling rate of 500Hz  and reviewed with a high frequency filter of 70Hz  and a low frequency filter of 1Hz . EEG data were recorded continuously and digitally stored.    Description: During awake state, no clear posterior dominant rhythm was seen. EEG showed continuous generalized 5 to 8 Hz theta-alpha activity. Sleep was characterized by sleep spindles (12-14hz ), maximal frontocentral region. There was also intermittent generalized and maximal left posterior quadrant 3 to 5 Hz theta -delta slowing.   Hyperventilation and photic stimulation were not performed.      ABNORMALITY - Continuous slow, generalized and maximal left posterior quadrant   IMPRESSION: This study showed cortical dysfunction in left posterior quadrant, non specific etiology but could be secondary to underlying structural abnormality. There is also moderate diffuse encephalopathy, non specific etiology. No seizures or definite epileptiform discharges were noted.    [name] Barbra Sarks

## 2020-11-25 NOTE — Progress Notes (Signed)
Neurology Progress Note  Brief HPI: [name] is a 50 y.o. female with a PMHx of poorly controlled type 1 diabetes, ESRD on intermittent HD, diabetic retinopathy, bilateral feet amputations and previously diagnosed osteomyelitis, PAD s/p stent placement who presented on 11/21/2020 with AMS and elevated blood sugars. She was diagnosed with DKA and bacteremia and started on vancomycin and cefepime. Infectious disease was consulted, blood cultures showed MRSA bacteremia. Therefore cefepime was discontinued 11/23/2020.  On 8/25 patient started complaining of "I cannot see anything" and can only see "bright light". Stat EEG was ordered which showed generalized periodic discharges with triphasic morphology at 1.5 Hz which was on the ictal interictal continuum. EEG pattern improved after IV 2 mg Ativan. MRI brain without contrast was obtained which did not show any acute abnormality. Neurology was consulted for further management.   On initial evaluation, she was sedated due to Ativan, later in the afternoon when she was more awake and her sister was at bedside as well. Patient reported her vision continued to be worse than her baseline. Both patient and her daughter reported that patient has had similar episodes in the past when her blood pressure is too high or too low, blood sugars too high or too low or after dialysis when her dry weight is less than 57kg.   Subjective: Patient sleeping initially, with stimulation she is restless and states that she is "crappy because my legs are cramping really bad and they won't give me a muscle relaxer" When asked about her vision she states "I can't see anything it is just bright lights" She does endorse having visual changes with hemodialysis when she has too much fluid pulled off but she states that her vision changes are dark vision during these events.   Current Facility-Administered Medications:    Chlorhexidine Gluconate Cloth 2 % PADS 6 each, 6 each,  Topical, Q0600, Roney Jaffe, MD, 6 each at 11/24/20 0515   collagenase (SANTYL) ointment, , Topical, Daily, [place], Einar Pheasant, MD, Given at 11/25/20 0805   dextrose 50 % solution 0-50 mL, 0-50 mL, Intravenous, PRN, Reubin Milan, MD   haloperidol lactate (HALDOL) injection 2 mg, 2 mg, Intramuscular, Q6H PRN, Jonetta Osgood, MD, 2 mg at 11/25/20 1055   hydrALAZINE (APRESOLINE) injection 10 mg, 10 mg, Intravenous, Q4H PRN, Jonetta Osgood, MD, 10 mg at 11/25/20 0804   insulin aspart (novoLOG) injection 0-6 Units, 0-6 Units, Subcutaneous, TID WC, Ghimire, Henreitta Leber, MD, 3 Units at 11/24/20 1233   insulin glargine-yfgn (SEMGLEE) injection 7 Units, 7 Units, Subcutaneous, Daily, Ghimire, Henreitta Leber, MD, 7 Units at 11/25/20 1055   methocarbamol (ROBAXIN) tablet 500 mg, 500 mg, Oral, Q8H PRN, Jonetta Osgood, MD, 500 mg at 11/25/20 1316   mupirocin ointment (BACTROBAN) 2 %, , Nasal, BID, Ghimire, Henreitta Leber, MD, Given at 11/25/20 1316   ondansetron (ZOFRAN) injection 4 mg, 4 mg, Intravenous, Q6H PRN, [place], Einar Pheasant, MD, 4 mg at 11/21/20 1805   vancomycin (VANCOREADY) IVPB 750 mg/150 mL, 750 mg, Intravenous, Q T,Th,Sa-HD, Karren Cobble, [place]  Exam: Vitals:   11/25/20 0358 11/25/20 0744  BP: (!) (P) 189/89 (!) 212/103  Pulse: (P) 75 87  Resp: (P) 16 13  Temp: (P) 97.8 F (36.6 C) 98.2 F (36.8 C)  SpO2: (P) 99% 96%   Gen: Laying comfortably in bed initially, when awake she has restless movements of all extremities  Resp: non-labored breathing, no respiratory distress on room air, SpO2 99% on telemetry Abd:  soft, non-distended  Neuro: Mental Status: Awake, alert, and oriented to person, place, time, and situation She is able to provide a clear and coherent history of present illness Speech is intact without dysarthria or aphasia Follows commands No neglect noted. Cranial Nerves: Right pupil irregular, sluggishly reactive to light 3 --> 2.5 mm, left pupil regular,  round, sluggishly reactive to light 3 --> 2. 5 mm, EOMI, does not fixate or track but will look in all directions to command, facial sensation intact and symmetric to light touch, face is symmetric resting and with movement, hearing is intact to voice, shoulders shrug symmetrically, phonation normal, palate elevates symmetrically, tongue protrudes midline Motor: Moves all extremities well with antigravity movement and without asymmetry Tone and bulk are normal Sensory: Sensation to light touch intact and symmetric to bilateral upper extremities and bilateral lower extremities proximal to the knees. Patient reports no sensation to bilateral lower extremities below the knees.  Gait: Deferred  Patient refused attending exam later in the evening, stating she wanted to sleep.   Pertinent Labs: CBC    Component Value Date/Time   WBC 5.1 11/25/2020 0811   RBC 3.77 (L) 11/25/2020 0811   HGB 11.1 (L) 11/25/2020 0811   HGB 10.2 (L) 12/18/2016 1544   HCT 34.3 (L) 11/25/2020 0811   HCT 23.2 (L) 07/30/2018 0356   PLT 274 11/25/2020 0811   PLT 627 (H) 12/18/2016 1544   MCV 91.0 11/25/2020 0811   MCV 86 12/18/2016 1544   MCH 29.4 11/25/2020 0811   MCHC 32.4 11/25/2020 0811   RDW 14.3 11/25/2020 0811   RDW 14.7 12/18/2016 1544   LYMPHSABS 1.0 11/21/2020 1227   LYMPHSABS 1.3 12/18/2016 1544   MONOABS 0.7 11/21/2020 1227   EOSABS 0.0 11/21/2020 1227   EOSABS 0.2 12/18/2016 1544   BASOSABS 0.0 11/21/2020 1227   BASOSABS 0.0 12/18/2016 1544   CMP     Component Value Date/Time   NA 136 11/24/2020 0200   K 3.7 11/24/2020 0200   CL 99 11/24/2020 0200   CO2 16 (L) 11/24/2020 0200   GLUCOSE 160 (H) 11/24/2020 0200   BUN 31 (H) 11/24/2020 0200   CREATININE 6.35 (H) 11/24/2020 0200   CREATININE 3.87 (H) 11/11/2017 1830   CALCIUM 8.4 (L) 11/24/2020 0200   CALCIUM 8.5 09/01/2009 2241   PROT 6.6 11/23/2020 0735   ALBUMIN 3.0 (L) 11/24/2020 0200   AST 26 11/23/2020 0735   ALT 32 11/23/2020 0735    ALKPHOS 156 (H) 11/23/2020 0735   BILITOT 0.9 11/23/2020 0735   GFRNONAA 8 (L) 11/24/2020 0200   GFRNONAA 13 (L) 11/01/2017 1715   GFRAA 16 (L) 08/08/2018 0708   GFRAA 16 (L) 11/01/2017 1715   Imaging Reviewed:  MRI brain without contrast 11/24/2020:  No acute intracranial pathology. Bilateral mastoid effusions  EEG prolonged 11/24/2020: "This study initially showed generalized periodic discharges with triphasic morphology at 1.5 Hz which at times appeared rhythmic. This EEG pattern is on the ictal and interictal continuum and can also be seen due to toxic-metabolic causes. IV Ativan 2 mg was administered after which patient fell asleep and EEG pattern improved. Improvement with benzodiazepine is not necessarily diagnostic of ictal nature.  Therefore, would recommend prolonged EEG monitoring for further evaluation of this EEG pattern."  Overnight EEG 11/24/2020 - 11/25/2020: "This study showed cortical dysfunction in left posterior quadrant, non specific etiology but could be secondary to underlying structural abnormality. There is also moderate diffuse encephalopathy, non specific etiology. No seizures or definite  epileptiform discharges were noted."  Assessment: 66 old female who presented with DKA and was found to have MRSA bacteremia. On presentation 11/24/2020 patient had worsening vision, MRI brain did not show any acute abnormality. - Initially, prolonged EEG was felt to be concerning for periodic discharges with triphasic morphology at 1.5 Hz which at times appeared rhythmic which can be seen on the ictal and interictal continuum and due to toxic-metabolic causes. IV lorazepam was administered with improvement in EEG pattern. Overnight EEG monitoring initiated.  - Overnight EEG revealed cortical dysfunction in the left posterior quadrant with nonspecific etiology but possibly secondary to underlying structural abnormality with moderate diffuse encephalopathy, also nonspecific etiology. There  were no seizures or epileptiform discharges noted throughout the recording. MRI brain was without acute intracranial pathology or identification of structural abnormality for seizure etiology.  - After obtaining further history, reviewing MRI brain and reviewing the long-term EEG, the vision disturbance is felt to be more likely due to blood pressure fluctuations, fluctuations in blood sugar. Low suspicion for seizures at this point. MRI brain did not show any acute stroke therefore low suspicion for stroke infectious emboli.  - Ophthalmology evaluation is without evidence of ocular cause for vision loss further supporting visual disturbance secondary to fluctuations in blood glucose and blood pressure.  - Alternatively, given her risk factors she may have microvascular damage to the optic nerves although this is not typically seen in both eyes simultaneously - Ischemic injury to the optic nerves secondary to poor flow via stenotic carotid arteries could also be considered; will evaluate with Carotid duplex  Impression:  Acute encephalopathy, toxic-metabolic- improving Vision disturbance EEG rules out seizure as an etiology MRI brain rules out bilateral occipital lesions as an etiology Concern for MRSA bacteremia however blood cultures negative to date x4 sets here  Recommendations:  - Discontinue LTM EEG monitoring - No indication for antiseizure medications at this time - Carotid duplex  - Agree with ophthalmology, patient will likely benefit from further specialized ophthalmological evaluation on an outpatient basis - OT evaluation and treatment for low vision compensatory mechanisms  - Management of rest of comorbidities per primary team  Anibal Henderson, AGACNP-BC [facility] [phone]  Attending Neurologist's note:  I personally saw this patient, gathering history, performing an examination, reviewing relevant labs, personally reviewing relevant imaging including MRI  brain, and formulated the assessment and plan, adding the note above for completeness and clarity to accurately reflect my thoughts   Lesleigh Noe MD-PhD [facility] [phone]  Available 7 AM to 7 PM, outside these hours please contact Neurologist on call listed on AMION

## 2020-11-25 NOTE — Progress Notes (Signed)
Patient refused bath, CHG wipedown, and linen change.  Patient educated on importance and refused again stating, "please just let me sleep.  I'll do it later."

## 2020-11-25 NOTE — Progress Notes (Signed)
LTM EEG discontinued - no skin breakdown at unhook.   

## 2020-11-26 ENCOUNTER — Encounter (HOSPITAL_COMMUNITY): Payer: Medicare HMO

## 2020-11-26 LAB — CBC
HCT: 33.7 % — ABNORMAL LOW (ref 36.0–46.0)
Hemoglobin: 11.1 g/dL — ABNORMAL LOW (ref 12.0–15.0)
MCH: 29.7 pg (ref 26.0–34.0)
MCHC: 32.9 g/dL (ref 30.0–36.0)
MCV: 90.1 fL (ref 80.0–100.0)
Platelets: 297 10*3/uL (ref 150–400)
RBC: 3.74 MIL/uL — ABNORMAL LOW (ref 3.87–5.11)
RDW: 14.4 % (ref 11.5–15.5)
WBC: 6.5 10*3/uL (ref 4.0–10.5)
nRBC: 0 % (ref 0.0–0.2)

## 2020-11-26 LAB — CULTURE, BLOOD (SINGLE)
Culture: NO GROWTH
Culture: NO GROWTH
Special Requests: ADEQUATE
Special Requests: ADEQUATE

## 2020-11-26 LAB — RENAL FUNCTION PANEL
Albumin: 2.8 g/dL — ABNORMAL LOW (ref 3.5–5.0)
Anion gap: 20 — ABNORMAL HIGH (ref 5–15)
BUN: 47 mg/dL — ABNORMAL HIGH (ref 6–20)
CO2: 18 mmol/L — ABNORMAL LOW (ref 22–32)
Calcium: 8 mg/dL — ABNORMAL LOW (ref 8.9–10.3)
Chloride: 95 mmol/L — ABNORMAL LOW (ref 98–111)
Creatinine, Ser: 10.24 mg/dL — ABNORMAL HIGH (ref 0.44–1.00)
GFR, Estimated: 4 mL/min — ABNORMAL LOW (ref >60)
Glucose, Bld: 230 mg/dL — ABNORMAL HIGH (ref 70–99)
Phosphorus: 7.2 mg/dL — ABNORMAL HIGH (ref 2.5–4.6)
Potassium: 4.5 mmol/L (ref 3.5–5.1)
Sodium: 133 mmol/L — ABNORMAL LOW (ref 135–145)

## 2020-11-26 LAB — GLUCOSE, CAPILLARY
Glucose-Capillary: 282 mg/dL — ABNORMAL HIGH (ref 70–99)
Glucose-Capillary: 207 mg/dL — ABNORMAL HIGH (ref 70–99)
Glucose-Capillary: 172 mg/dL — ABNORMAL HIGH (ref 70–99)
Glucose-Capillary: 297 mg/dL — ABNORMAL HIGH (ref 70–99)

## 2020-11-26 MED ORDER — VANCOMYCIN HCL 750 MG/150ML IV SOLN
INTRAVENOUS | Status: AC
  Administered 2020-11-26: 750 mg via INTRAVENOUS
  Filled 2020-11-26: qty 150

## 2020-11-26 MED ORDER — CLONAZEPAM 0.5 MG PO TABS
0.5000 mg | ORAL_TABLET | Freq: Two times a day (BID) | ORAL | Status: DC | PRN
  Administered 2020-11-27 – 2020-11-30 (×4): 0.5 mg via ORAL
  Filled 2020-11-26 (×4): qty 1

## 2020-11-26 MED ORDER — MAGNESIUM SULFATE 2 GM/50ML IV SOLN
2.0000 g | Freq: Once | INTRAVENOUS | Status: AC
  Administered 2020-11-26: 2 g via INTRAVENOUS
  Filled 2020-11-26: qty 50

## 2020-11-26 MED ORDER — DOXERCALCIFEROL 4 MCG/2ML IV SOLN
INTRAVENOUS | Status: AC
  Administered 2020-11-26: 9 ug via INTRAVENOUS
  Filled 2020-11-26: qty 6

## 2020-11-26 NOTE — Progress Notes (Signed)
7am-7pm Pt observed pulling the scab off of his left 2nd toe & put in her mouth then put her fingers in the wound. Asked pt why she did that she stated that she just did & laughed. Pt also stated today while her brother was visiting that she had brought over $200 of marjuana at home but she doesn't use it.Dr. Sloan Leiter was made aware of the above. Left 2nd toe cleanse with NS & Mepilex drsg applied.

## 2020-11-26 NOTE — Progress Notes (Signed)
[facility] Progress Note   Subjective:   Patient seen and examined at bedside.  Reports pain all over her body, especially her neck.  Denies CP, SOB, n/v/d, abdominal pain and edema.  Objective Vitals:   11/25/20 2300 11/26/20 0352 11/26/20 0805 11/26/20 1055  BP: (!) 113/57 (!) 172/82 (!) 112/52 (!) 165/76  Pulse: 81 87 86   Resp: 12 15 11    Temp: 99.3 F (37.4 C) 98.9 F (37.2 C)    TempSrc: Axillary Axillary    SpO2: 96% 97% 96%   Weight:      Height:       Physical Exam General:chronically ill appearing female in NAD Heart:RRR, no mrg Lungs:CTAB, nml WOB on RA Abdomen:soft, NTND Extremities:no LE edema Dialysis Access: LU AVF +v/t   Filed Weights   11/21/20 0600 11/23/20 0949 11/23/20 1347  Weight: 60.8 kg 61.1 kg 57.6 kg    Intake/Output Summary (Last 24 hours) at 11/26/2020 1248 Last data filed at 11/26/2020 0521 Gross per 24 hour  Intake --  Output 150 ml  Net -150 ml    Additional Objective Labs: Basic Metabolic Panel: Recent Labs  Lab 11/24/20 0200 11/25/20 0811 11/26/20 0116  NA 136 137 133*  K 3.7 4.2 4.5  CL 99 100 95*  CO2 16* 20* 18*  GLUCOSE 160* 103* 230*  BUN 31* 39* 47*  CREATININE 6.35* 9.07* 10.24*  CALCIUM 8.4* 8.0* 8.0*  PHOS 4.8* 6.7* 7.2*   Liver Function Tests: Recent Labs  Lab 11/21/20 0550 11/23/20 0735 11/24/20 0200 11/25/20 0811 11/26/20 0116  AST 28 26  --   --   --   ALT 45* 32  --   --   --   ALKPHOS 242* 156*  --   --   --   BILITOT 0.7 0.9  --   --   --   PROT 8.1 6.6  --   --   --   ALBUMIN 3.9 3.0*  3.1* 3.0* 2.8* 2.8*   Recent Labs  Lab 11/21/20 0550  LIPASE 40   CBC: Recent Labs  Lab 11/21/20 0550 11/21/20 1227 11/22/20 0714 11/22/20 1221 11/23/20 0141 11/24/20 0200 11/25/20 0811 11/26/20 0116  WBC 10.3 7.8   < > 6.4 7.9 7.5 5.1 6.5  NEUTROABS 8.6* 6.0  --   --   --   --   --   --   HGB 11.0* 10.2*   < > 10.1* 9.6* 10.6* 11.1* 11.1*  HCT 32.8* 30.6*   < > 29.6* 28.6*  32.8* 34.3* 33.7*  MCV 89.4 88.4   < > 86.8 88.8 91.9 91.0 90.1  PLT 246 226   < > 208 193 230 274 297   < > = values in this interval not displayed.   Blood Culture    Component Value Date/Time   SDES BLOOD RIGHT HAND 11/23/2020 1527   SPECREQUEST  11/23/2020 1527    BOTTLES DRAWN AEROBIC AND ANAEROBIC Blood Culture results may not be optimal due to an excessive volume of blood received in culture bottles   CULT  11/23/2020 1527    NO GROWTH 3 DAYS Performed at [facility], [place] [address] [place], [place] [id number]    REPTSTATUS PENDING 11/23/2020 1527   CBG: Recent Labs  Lab 11/25/20 1156 11/25/20 1557 11/25/20 2106 11/26/20 0916 11/26/20 1204  GLUCAP 147* 117* 181* 282* 207*    Studies/Results: Adult EEG prolonged 41-60 minutes  Result Date: 11/24/2020 Lora Havens, MD  11/24/2020  3:14 PM Patient Name: [name] MRN: [id number] Epilepsy Attending: Lora Havens Referring Physician/Provider: Dr Oren Binet Date: 11/24/2020 Duration: 43.03 mins Patient history: 50 year old female admitted with DKA, MRSA bacteremia and had sudden worsening of vision.  EEG to evaluate for seizures. Level of alertness: Awake, asleep AEDs during EEG study: None Technical aspects: This EEG study was done with scalp electrodes positioned according to the 10-20 International system of electrode placement. Electrical activity was acquired at a sampling rate of 500Hz  and reviewed with a high frequency filter of 70Hz  and a low frequency filter of 1Hz . EEG data were recorded continuously and digitally stored. Description: During awake state, no clear posterior dominant rhythm was seen.  EEG showed continuous 3 to 5 Hz theta-delta slowing.  There was also generalized and maximal left hemisphere periodic epileptiform discharges with triphasic morphology at 1.5 Hz which at times appeared rhythmic without definite evolution.  IV Ativan was administered on 11/24/2020 at 0 932 after which  EEG showed sleep spindles (12 to 14 Hz), maximal frontocentral region as well as intermittent generalized 3 to 5 Hz theta and delta slowing.  Hyperventilation and photic stimulation were not performed.   ABNORMALITY - Periodic discharges with triphasic morphology, generalized and lateralized left hemisphere ( GPDs) - Continuous slow, generalized IMPRESSION: This study initially showed generalized periodic discharges with triphasic morphology at 1.5 Hz which at times appeared rhythmic.  This EEG pattern is on the ictal and interictal continuum and can also be seen due to toxic-metabolic causes.  IV Ativan 2 mg was administered after which patient fell asleep and EEG pattern improved.  Improvement with benzodiazepine is not necessarily diagnostic of ictal nature.  Therefore, would recommend prolonged EEG monitoring for further evaluation of this EEG pattern. Lora Havens    ECHOCARDIOGRAM COMPLETE  Result Date: 11/24/2020    ECHOCARDIOGRAM REPORT   Patient Name:   [name] Date of Exam: 11/24/2020 Medical Rec #:  [id number]          Height:       67.0 in Accession #:    [id number]         Weight:       127.0 lb Date of Birth:  07/11/1970          BSA:          1.667 m Patient Age:    50 years           BP:           150/70 mmHg Patient Gender: F                  HR:           100 bpm. Exam Location:  Inpatient Procedure: 2D Echo, Cardiac Doppler and Color Doppler Indications:    Bacteremia  History:        Patient has prior history of Echocardiogram examinations, most                 recent 08/06/2018. Risk Factors:Hypertension.  Sonographer:    Bernadene Person RDCS Referring Phys: [id number] [place]  1. Left ventricular ejection fraction, by estimation, is 60 to 65%. The left ventricle has normal function. The left ventricle has no regional wall motion abnormalities. Left ventricular diastolic parameters were normal.  2. Right ventricular systolic function is normal. The right  ventricular size is normal. There is mildly elevated pulmonary artery systolic pressure.  3. The mitral valve is normal  in structure. No evidence of mitral valve regurgitation. No evidence of mitral stenosis.  4. The aortic valve is tricuspid. Aortic valve regurgitation is not visualized. No aortic stenosis is present.  5. The inferior vena cava is normal in size with greater than 50% respiratory variability, suggesting right atrial pressure of 3 mmHg. FINDINGS  Left Ventricle: Left ventricular ejection fraction, by estimation, is 60 to 65%. The left ventricle has normal function. The left ventricle has no regional wall motion abnormalities. The left ventricular internal cavity size was normal in size. There is  no left ventricular hypertrophy. Left ventricular diastolic parameters were normal. Right Ventricle: The right ventricular size is normal. Right ventricular systolic function is normal. There is mildly elevated pulmonary artery systolic pressure. The tricuspid regurgitant velocity is 2.99 m/s, and with an assumed right atrial pressure of 3 mmHg, the estimated right ventricular systolic pressure is 77.9 mmHg. Left Atrium: Left atrial size was normal in size. Right Atrium: Right atrial size was normal in size. Pericardium: Trivial pericardial effusion is present. Mitral Valve: The mitral valve is normal in structure. Mild mitral annular calcification. No evidence of mitral valve regurgitation. No evidence of mitral valve stenosis. Tricuspid Valve: The tricuspid valve is normal in structure. Tricuspid valve regurgitation is mild . No evidence of tricuspid stenosis. Aortic Valve: The aortic valve is tricuspid. Aortic valve regurgitation is not visualized. No aortic stenosis is present. Pulmonic Valve: The pulmonic valve was normal in structure. Pulmonic valve regurgitation is not visualized. No evidence of pulmonic stenosis. Aorta: The aortic root is normal in size and structure. Venous: The inferior vena cava  is normal in size with greater than 50% respiratory variability, suggesting right atrial pressure of 3 mmHg. IAS/Shunts: No atrial level shunt detected by color flow Doppler.  LEFT VENTRICLE PLAX 2D LVIDd:         4.30 cm  Diastology LVIDs:         2.70 cm  LV e' medial:  8.00 cm/s LV PW:         1.10 cm  LV e' lateral: 9.74 cm/s LV IVS:        1.00 cm LVOT diam:     1.60 cm LV SV:         51 LV SV Index:   30 LVOT Area:     2.01 cm  RIGHT VENTRICLE RV S prime:     12.00 cm/s TAPSE (M-mode): 2.3 cm LEFT ATRIUM             Index       RIGHT ATRIUM           Index LA diam:        3.30 cm 1.98 cm/m  RA Area:     11.30 cm LA Vol (A2C):   35.8 ml 21.47 ml/m RA Volume:   23.30 ml  13.98 ml/m LA Vol (A4C):   39.3 ml 23.57 ml/m LA Biplane Vol: 38.1 ml 22.85 ml/m  AORTIC VALVE LVOT Vmax:   137.00 cm/s LVOT Vmean:  92.600 cm/s LVOT VTI:    0.252 m  AORTA Ao Root diam: 2.90 cm Ao Asc diam:  3.30 cm TRICUSPID VALVE TR Peak grad:   35.8 mmHg TR Vmax:        299.00 cm/s  SHUNTS Systemic VTI:  0.25 m Systemic Diam: 1.60 cm Kirk Ruths MD Electronically signed by Kirk Ruths MD Signature Date/Time: 11/24/2020/3:20:12 PM    Final     Medications:  magnesium sulfate bolus IVPB  vancomycin      amLODipine  10 mg Oral Daily   calcium acetate  1,334 mg Oral TID WC   Chlorhexidine Gluconate Cloth  6 each Topical Q0600   Chlorhexidine Gluconate Cloth  6 each Topical Q0600   collagenase   Topical Daily   doxercalciferol  9 mcg Intravenous Q T,Th,Sa-HD   insulin aspart  0-6 Units Subcutaneous TID WC   insulin glargine-yfgn  7 Units Subcutaneous Daily   mupirocin ointment   Nasal BID    Dialysis Orders: AF TTS   4h  56kg  2/2 bath Hep none LUA AVF   - hect 9 ug IV tiw     Problem/Plan DKA / type 1 DM -  stabilizing plans per admit. Please do not use LR or NS fluids without renal service approval.  OK to use straight D5W or D10W for BS support as needed.  Hyponatremia/ hyperkalemia - Na 133, K 4.5  today.  Plan for HD today. ESRD - on HD TTS.  Off schedule this week due to staffing issues and increased patient volume. Plan for HD today back on schedule.  BP/ volume - BP's variable.  Amlodipine started, on labetalol and hydralazine IV prn. 4.7 L removed with last HD.  Does not appear grossly volume overloaded.  AMS/visual loss- seen by ophthalmology/neuro, thought to be due to metabolic dearrangements, and glucose/BP fluctuations - per primary L foot osteomyelitis -Plan for 4 weeks vancomycin IV with HD with end date 12/19/20.  ID signed off.  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  No ESA. 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  Phos elevated. Restart binders. Continue VDRA.   Jen Mow, PA-C [facility] 11/26/2020,12:48 PM  LOS: 5 days

## 2020-11-26 NOTE — Progress Notes (Addendum)
PROGRESS NOTE        PATIENT DETAILS Name: [name] Age: 50 y.o. Sex: female Date of Birth: 26-Mar-1971 Admit Date: 11/21/2020 Admitting Physician Jacqlyn Krauss, MD [id number], Babs Bertin, PA-C  Brief Narrative: Patient is a 50 y.o. female with history of DM-1, diabetic retinopathy, chronic left foot osteomyelitis, ESRD on HD-recent history of MRSA bacteremia-presented with acute metabolic encephalopathy, DKA and left diabetic foot with osteomyelitis.  On 8/25-her encephalopathy finally improved-but was found to have bilateral visual loss with initial concern for seizure like activity on spot EEG-however no seizure noted on LTM EEG, but now felt to be due to BP/CBG fluctuation.  See below for further details.  Significant events: 7/14>> nausea/vomiting-left foot cellulitis-evaluated at urgent care in [place] on 7/14-blood cultures positive for MRSA.  Declined admission.  Subsequently received IV vancomycin with HD from 7/26-8/04 8/22>> presented to [facility] ED with confusion-found to have DKA 8/25>> finally more awake and alert-claims she cannot see out of both eyes.  Spot EEG w discharge from left hemisphere-EEG improved with Ativan 2 mg x 1  Significant studies: 8/22>> x-ray left foot: Osteomyelitis at the second phalange 8/22>> CXR: No pneumonia 8/22>> CT head: No acute intracranial abnormality 8/24>> CT maxillofacial area: No acute infection 8/25>> EEG: Generalized periodic discharges from left hemisphere-given 2 mg of IV Ativan-EEG improved. 8/25>> Echo: EF 60-65%-no obvious vegetations. 8/25>> MRI brain: No acute intracranial pathology 8/25-8/26>> LTM EEG: Cortical dysfunction in the left posterior quadrant-nonspecific etiology-no seizures or definite epileptiform discharges seen.  Antimicrobial therapy: Vancomycin: 8/22>> Cefepime: 8/22>>8/23  Microbiology data: 8/22>> COVID/influenza PCR: Negative 8/22>> blood culture: Negative so far 8/24>>  blood culture: Negative so far  Procedures : None  Consults: ID, nephrology, podiatry, neurology,opthalmology  DVT Prophylaxis : SCDs Start: 11/21/20 1037   Subjective: Awake/alert-claims she could not sleep last night due to anxiety.  No improvement in her vision overnight.  Assessment/Plan: Acute metabolic encephalopathy: Multifactorial etiology-DKA/BUN elevation in setting of ESRD/possible seizures-mentation has improved with supportive care-she is now awake and alert.  ?  Seizures: Initial spot EEG was suspicious for possible seizures-however LTM EEG was negative for seizures.  Neurology recommending to hold off on starting antiepileptics.  Bilateral visual loss: No clear-cut etiology identified-no pathology evident on ocular exam by ophthalmologist.  Per my discussion with primary ophthalmologist on 8/25-patient has a longstanding history of diabetic retinopathy (20/70 on right and 20/50 on left)-history of laser procedures/and eye injections every 8 weeks per primary ophthalmologist). No lesion seen on MRI brain.  Discussed with Dr. Mayford Knife MD on 8/26-suspicion that she may have had small infarcts in her bilateral optic nerves in the setting of blood pressure/CBG fluctuations in a background of significant diabetic retinopathy at baseline.  Recommendations from neurology are to obtain a bilateral carotid Doppler (pending), and to slowly optimize her diabetic/antihypertensive regimen.    Ophthalmology recommending more specialized exam in the outpatient setting after discharge (follows with Ophthalmologist Dr Baird Cancer in [facility]).  Sepsis due to acute osteomyelitis involving the left second phalanx-possibility of underlying MRSA infection: Sepsis physiology has improved.  Patient had positive blood cultures for MRSA in [place] on 7/14-it appears that she declined admission and apparently received treatment with IV vancomycin with hemodialysis from 7/26-8/4.  Evaluated by podiatry on  8/23-she appears to have more of a chronic osteomyelitis rather than a acute osteomyelitis. Blood cultures on 8/22  negative so far-transthoracic echo without any vegetations.  ID recommending 4 weeks of vancomycin with HD-end date of 12/19/2020.  Facial swelling: Etiology felt to be either due to volume overload-improved after fluid removal with HD on 8/24.  CT maxillofacial area does not show any acute infection.    DKA history of DM 1 on insulin pump (A1c 8.1 on 8/3): DKA resolved with IV insulin-CBGs currently stable with 7 units of Lantus and SSI.  Prior to this hospitalization-patient was on an insulin pump-with her having visual issues-she would likely need to stay on basal/bolus regimen.    Recent Labs    11/25/20 2106 11/26/20 0916 11/26/20 1204  GLUCAP 181* 282* 207*      Diarrhea:?  Etiology-unclear if patient has bowel related autonomic neuropathy in the setting of diabetes.  C. difficile studies and GI pathogen panel negative.  Will use prn Imodium.  ESRD: Nephrology following-we will defer HD to nephrology.  Normocytic anemia: Due to ESRD-defer need for EPO/IV iron to nephrology.  HLD: Resume statin when able  HTN: BP fluctuating-but overall better controlled-continue amlodipine  History of right transmetatarsal amputation  Social issues: Needs social work evaluation (consulted 8/27)-she has bilateral visual loss-previously was on insulin pump-if she continues to have visual loss-she will probably require significant assistance by family on discharge home or will need SNF.  Will obtain PT/OT eval as well  Diet: Diet Order             Diet renal/carb modified with fluid restriction Diet-HS Snack? Nothing; Fluid restriction: 1200 mL Fluid; Room service appropriate? Yes; Fluid consistency: Thin  Diet effective now                    Code Status: Full code   Family Communication: Spoke with sister-[name] over phone on 8/27  Disposition  Plan: Status is: Inpatient  Remains inpatient appropriate because:Inpatient level of care appropriate due to severity of illness  Dispo: The patient is from: Home              Anticipated d/c is to: Home              Patient currently is not medically stable to d/c.   Difficult to place patient No   Barriers to Discharge: Resolving encephalopathy-now with seizure disorder-blindness-work-up in progress.  See above.    Antimicrobial agents: Anti-infectives (From admission, onward)    Start     Dose/Rate Route Frequency Ordered Stop   11/23/20 1800  ceFEPIme (MAXIPIME) 1 g in sodium chloride 0.9 % 100 mL IVPB  Status:  Discontinued        1 g 200 mL/hr over 30 Minutes Intravenous Daily-1800 11/23/20 1126 11/23/20 1145   11/23/20 1030  vancomycin (VANCOREADY) IVPB 750 mg/150 mL        750 mg 150 mL/hr over 60 Minutes Intravenous To Hemodialysis 11/23/20 1021 11/23/20 1305   11/23/20 1030  ceFEPIme (MAXIPIME) 2 g in sodium chloride 0.9 % 100 mL IVPB  Status:  Discontinued        2 g 200 mL/hr over 30 Minutes Intravenous To Hemodialysis 11/23/20 1021 11/23/20 1125   11/22/20 1800  ceFEPIme (MAXIPIME) 2 g in sodium chloride 0.9 % 100 mL IVPB  Status:  Discontinued        2 g 200 mL/hr over 30 Minutes Intravenous Every T-Th-Sa (Hemodialysis) 11/21/20 2018 11/22/20 0816   11/22/20 1800  ceFEPIme (MAXIPIME) 2 g in sodium chloride 0.9 % 100 mL IVPB  Status:  Discontinued        2 g 200 mL/hr over 30 Minutes Intravenous Every T-Th-Sa (1800) 11/22/20 0816 11/23/20 1125   11/22/20 1500  vancomycin (VANCOREADY) IVPB 750 mg/150 mL        750 mg 150 mL/hr over 60 Minutes Intravenous Every T-Th-Sa (Hemodialysis) 11/22/20 1357 12/19/20 2359   11/22/20 1000  ceFEPIme (MAXIPIME) 2 g in sodium chloride 0.9 % 100 mL IVPB  Status:  Discontinued       Note to Pharmacy: Pharmacy to dose   2 g 200 mL/hr over 30 Minutes Intravenous Every 24 hours 11/21/20 1035 11/21/20 2017   11/22/20 0435  vancomycin  variable dose per unstable renal function (pharmacist dosing)  Status:  Discontinued         Does not apply See admin instructions 11/22/20 0435 11/23/20 1022   11/21/20 0700  vancomycin (VANCOCIN) IVPB 1000 mg/200 mL premix        1,000 mg 200 mL/hr over 60 Minutes Intravenous  Once 11/21/20 0648 11/21/20 1247   11/21/20 0700  ceFEPIme (MAXIPIME) 2 g in sodium chloride 0.9 % 100 mL IVPB        2 g 200 mL/hr over 30 Minutes Intravenous  Once 11/21/20 0656 11/21/20 1022   11/21/20 0645  metroNIDAZOLE (FLAGYL) IVPB 500 mg        500 mg 100 mL/hr over 60 Minutes Intravenous  Once 11/21/20 0636 11/21/20 0816        Time spent: 35 minutes-Greater than 50% of this time was spent in counseling, explanation of diagnosis, planning of further management, and coordination of care.  MEDICATIONS: Scheduled Meds:  amLODipine  10 mg Oral Daily   calcium acetate  1,334 mg Oral TID WC   Chlorhexidine Gluconate Cloth  6 each Topical Q0600   Chlorhexidine Gluconate Cloth  6 each Topical Q0600   collagenase   Topical Daily   doxercalciferol  9 mcg Intravenous Q T,Th,Sa-HD   insulin aspart  0-6 Units Subcutaneous TID WC   insulin glargine-yfgn  7 Units Subcutaneous Daily   mupirocin ointment   Nasal BID   Continuous Infusions:  magnesium sulfate bolus IVPB     vancomycin     PRN Meds:.dextrose, haloperidol lactate, hydrALAZINE, labetalol, melatonin, methocarbamol, ondansetron (ZOFRAN) IV   PHYSICAL EXAM: Vital signs: Vitals:   11/26/20 1055 11/26/20 1259 11/26/20 1320 11/26/20 1330  BP: (!) 165/76 (!) 167/78 (!) 144/99 (!) 157/76  Pulse:  94 92 89  Resp:  16  14  Temp:  98.1 F (36.7 C) 98.3 F (36.8 C)   TempSrc:  Oral Oral   SpO2:  97% 98%   Weight:   58.1 kg   Height:       Filed Weights   11/23/20 0949 11/23/20 1347 11/26/20 1320  Weight: 61.1 kg 57.6 kg 58.1 kg   Body mass index is 20.06 kg/m.   Gen Exam:Alert awake-not in any distress. HEENT:atraumatic,  normocephalic Chest: B/L clear to auscultation anteriorly CVS:S1S2 regular Abdomen:soft non tender, non distended Extremities:no edema Neurology: Non focal Skin: no rash   I have personally reviewed following labs and imaging studies  LABORATORY DATA: CBC: Recent Labs  Lab 11/21/20 0550 11/21/20 1227 11/22/20 0714 11/22/20 1221 11/23/20 0141 11/24/20 0200 11/25/20 0811 11/26/20 0116  WBC 10.3 7.8   < > 6.4 7.9 7.5 5.1 6.5  NEUTROABS 8.6* 6.0  --   --   --   --   --   --   HGB 11.0*  10.2*   < > 10.1* 9.6* 10.6* 11.1* 11.1*  HCT 32.8* 30.6*   < > 29.6* 28.6* 32.8* 34.3* 33.7*  MCV 89.4 88.4   < > 86.8 88.8 91.9 91.0 90.1  PLT 246 226   < > 208 193 230 274 297   < > = values in this interval not displayed.     Basic Metabolic Panel: Recent Labs  Lab 11/22/20 2218 11/23/20 0735 11/24/20 0200 11/25/20 0811 11/26/20 0116  NA 130* 130* 136 137 133*  K 5.1 4.9 3.7 4.2 4.5  CL 94* 95* 99 100 95*  CO2 18* 16* 16* 20* 18*  GLUCOSE 112* 140* 160* 103* 230*  BUN 78* 79* 31* 39* 47*  CREATININE 9.81* 9.95* 6.35* 9.07* 10.24*  CALCIUM 8.3* 8.2* 8.4* 8.0* 8.0*  PHOS  --  6.0* 4.8* 6.7* 7.2*     GFR: Estimated Creatinine Clearance: 6.1 mL/min (A) (by C-G formula based on SCr of 10.24 mg/dL (H)).  Liver Function Tests: Recent Labs  Lab 11/21/20 0550 11/23/20 0735 11/24/20 0200 11/25/20 0811 11/26/20 0116  AST 28 26  --   --   --   ALT 45* 32  --   --   --   ALKPHOS 242* 156*  --   --   --   BILITOT 0.7 0.9  --   --   --   PROT 8.1 6.6  --   --   --   ALBUMIN 3.9 3.0*  3.1* 3.0* 2.8* 2.8*    Recent Labs  Lab 11/21/20 0550  LIPASE 40    Recent Labs  Lab 11/23/20 0852  AMMONIA 51*     Coagulation Profile: Recent Labs  Lab 11/21/20 0550  INR 1.0     Cardiac Enzymes: No results for input(s): CKTOTAL, CKMB, CKMBINDEX, TROPONINI in the last 168 hours.  BNP (last 3 results) No results for input(s): PROBNP in the last 8760 hours.  Lipid  Profile: No results for input(s): CHOL, HDL, LDLCALC, TRIG, CHOLHDL, LDLDIRECT in the last 72 hours.  Thyroid Function Tests: No results for input(s): TSH, T4TOTAL, FREET4, T3FREE, THYROIDAB in the last 72 hours.  Anemia Panel: No results for input(s): VITAMINB12, FOLATE, FERRITIN, TIBC, IRON, RETICCTPCT in the last 72 hours.  Urine analysis:    Component Value Date/Time   COLORURINE YELLOW 11/23/2020 0410   APPEARANCEUR CLEAR 11/23/2020 0410   LABSPEC 1.012 11/23/2020 0410   PHURINE 7.0 11/23/2020 0410   GLUCOSEU >=500 (A) 11/23/2020 0410   GLUCOSEU 500 (A) 01/26/2015 1200   HGBUR NEGATIVE 11/23/2020 0410   BILIRUBINUR NEGATIVE 11/23/2020 0410   KETONESUR 5 (A) 11/23/2020 0410   PROTEINUR 100 (A) 11/23/2020 0410   UROBILINOGEN 0.2 01/26/2015 1200   NITRITE NEGATIVE 11/23/2020 0410   LEUKOCYTESUR NEGATIVE 11/23/2020 0410    Sepsis Labs: Lactic Acid, Venous    Component Value Date/Time   LATICACIDVEN 1.6 11/21/2020 1227    MICROBIOLOGY: Recent Results (from the past 240 hour(s))  Blood culture (routine single)     Status: None   Collection Time: 11/21/20  5:50 AM   Specimen: BLOOD  Result Value Ref Range Status   Specimen Description   Final    BLOOD SITE NOT SPECIFIED Performed at [facility] [address], [place], [place] [address]    Special Requests   Final    BOTTLES DRAWN AEROBIC AND ANAEROBIC Blood Culture adequate volume Performed at [facility] [address], [place], [place] [address]  Culture   Final    NO GROWTH 5 DAYS Performed at [facility], [place] [address] [place], [place] [address]    Report Status 11/26/2020 FINAL  Final  Resp Panel by RT-PCR (Flu A&B, Covid) Nasopharyngeal Swab     Status: None   Collection Time: 11/21/20  8:11 AM   Specimen: Nasopharyngeal Swab; Nasopharyngeal(NP) swabs in vial transport medium  Result Value Ref Range Status   SARS Coronavirus 2 by RT PCR NEGATIVE  NEGATIVE Final    Comment: (NOTE) SARS-CoV-2 target nucleic acids are NOT DETECTED.  The SARS-CoV-2 RNA is generally detectable in upper respiratory specimens during the acute phase of infection. The lowest concentration of SARS-CoV-2 viral copies this assay can detect is 138 copies/mL. A negative result does not preclude SARS-Cov-2 infection and should not be used as the sole basis for treatment or other patient management decisions. A negative result may occur with  improper specimen collection/handling, submission of specimen other than nasopharyngeal swab, presence of viral mutation(s) within the areas targeted by this assay, and inadequate number of viral copies(<138 copies/mL). A negative result must be combined with clinical observations, patient history, and epidemiological information. The expected result is Negative.  Fact Sheet for Patients:  EntrepreneurPulse.com.au  Fact Sheet for Healthcare Providers:  IncredibleEmployment.be  This test is no t yet approved or cleared by the [place] FDA and  has been authorized for detection and/or diagnosis of SARS-CoV-2 by FDA under an Emergency Use Authorization (EUA). This EUA will remain  in effect (meaning this test can be used) for the duration of the COVID-19 declaration under Section 564(b)(1) of the Act, 21 U.S.C.section 360bbb-3(b)(1), unless the authorization is terminated  or revoked sooner.       Influenza A by PCR NEGATIVE NEGATIVE Final   Influenza B by PCR NEGATIVE NEGATIVE Final    Comment: (NOTE) The Xpert Xpress SARS-CoV-2/FLU/RSV plus assay is intended as an aid in the diagnosis of influenza from Nasopharyngeal swab specimens and should not be used as a sole basis for treatment. Nasal washings and aspirates are unacceptable for Xpert Xpress SARS-CoV-2/FLU/RSV testing.  Fact Sheet for Patients: EntrepreneurPulse.com.au  Fact Sheet for Healthcare  Providers: IncredibleEmployment.be  This test is not yet approved or cleared by the [place] FDA and has been authorized for detection and/or diagnosis of SARS-CoV-2 by FDA under an Emergency Use Authorization (EUA). This EUA will remain in effect (meaning this test can be used) for the duration of the COVID-19 declaration under Section 564(b)(1) of the Act, 21 U.S.C. section 360bbb-3(b)(1), unless the authorization is terminated or revoked.  Performed at [facility], [place] [address], [place], [place] [address]   Culture, blood (single)     Status: None   Collection Time: 11/21/20  3:45 PM   Specimen: BLOOD RIGHT HAND  Result Value Ref Range Status   Specimen Description BLOOD RIGHT HAND  Final   Special Requests IN PEDIATRIC BOTTLE Blood Culture adequate volume  Final   Culture   Final    NO GROWTH 5 DAYS Performed at [facility], [name] [address], [place], [place] [address]    Report Status 11/26/2020 FINAL  Final  MRSA Next Gen by PCR, Nasal     Status: Abnormal   Collection Time: 11/22/20 10:57 AM   Specimen: Nasal Mucosa; Nasal Swab  Result Value Ref Range Status   MRSA by PCR Next Gen DETECTED (A) NOT DETECTED Final    Comment: RESULT CALLED TO, READ  BACK BY AND VERIFIED WITH: Maryan Puls RN 11/22/2020 @1417  BY JW (NOTE) The GeneXpert MRSA Assay (FDA approved for NASAL specimens only), is one component of a comprehensive MRSA colonization surveillance program. It is not intended to diagnose MRSA infection nor to guide or monitor treatment for MRSA infections. Test performance is not FDA approved in patients less than 27 years old. Performed at [facility], [place] [address], [place], [place] [address]   C Difficile Quick Screen w PCR reflex     Status: None   Collection Time: 11/22/20  4:33 PM   Specimen: STOOL  Result Value Ref Range Status   C Diff antigen NEGATIVE NEGATIVE Final   C Diff toxin NEGATIVE  NEGATIVE Final   C Diff interpretation No C. difficile detected.  Final    Comment: Performed at [facility], [place] [address] [place], [place] [address]  Gastrointestinal Panel by PCR , Stool     Status: None   Collection Time: 11/22/20  4:33 PM   Specimen: STOOL  Result Value Ref Range Status   Campylobacter species NOT DETECTED NOT DETECTED Final   Plesimonas shigelloides NOT DETECTED NOT DETECTED Final   Salmonella species NOT DETECTED NOT DETECTED Final   Yersinia enterocolitica NOT DETECTED NOT DETECTED Final   Vibrio species NOT DETECTED NOT DETECTED Final   Vibrio cholerae NOT DETECTED NOT DETECTED Final   Enteroaggregative E coli (EAEC) NOT DETECTED NOT DETECTED Final   Enteropathogenic E coli (EPEC) NOT DETECTED NOT DETECTED Final   Enterotoxigenic E coli (ETEC) NOT DETECTED NOT DETECTED Final   Shiga like toxin producing E coli (STEC) NOT DETECTED NOT DETECTED Final   Shigella/Enteroinvasive E coli (EIEC) NOT DETECTED NOT DETECTED Final   Cryptosporidium NOT DETECTED NOT DETECTED Final   Cyclospora cayetanensis NOT DETECTED NOT DETECTED Final   Entamoeba histolytica NOT DETECTED NOT DETECTED Final   Giardia lamblia NOT DETECTED NOT DETECTED Final   Adenovirus F40/41 NOT DETECTED NOT DETECTED Final   Astrovirus NOT DETECTED NOT DETECTED Final   Norovirus GI/GII NOT DETECTED NOT DETECTED Final   Rotavirus A NOT DETECTED NOT DETECTED Final   Sapovirus (I, II, IV, and V) NOT DETECTED NOT DETECTED Final    Comment: Performed at [facility], [address] [place], [place] [address]  Culture, blood (routine x 2)     Status: None (Preliminary result)   Collection Time: 11/23/20  3:26 PM   Specimen: BLOOD RIGHT HAND  Result Value Ref Range Status   Specimen Description BLOOD RIGHT HAND  Final   Special Requests   Final    BOTTLES DRAWN AEROBIC AND ANAEROBIC Blood Culture results may not be optimal due to an excessive volume of blood received in culture bottles    Culture   Final    NO GROWTH 3 DAYS Performed at [facility], [address], [place], [place] [address]    Report Status PENDING  Incomplete  Culture, blood (routine x 2)     Status: None (Preliminary result)   Collection Time: 11/23/20  3:27 PM   Specimen: BLOOD RIGHT HAND  Result Value Ref Range Status   Specimen Description BLOOD RIGHT HAND  Final   Special Requests   Final    BOTTLES DRAWN AEROBIC AND ANAEROBIC Blood Culture results may not be optimal due to an excessive volume of blood received in culture bottles   Culture   Final    NO GROWTH 3 DAYS Performed at [facility], [place] [address], [place],  [address]    Report Status PENDING  Incomplete    RADIOLOGY STUDIES/RESULTS: Adult EEG prolonged 41-60 minutes  Result Date: 11/24/2020 Lora Havens, MD     11/24/2020  3:14 PM Patient Name: AUNESTI PELLEGRINO MRN: [id number] Epilepsy Attending: Lora Havens Referring Physician/Provider: Dr Oren Binet Date: 11/24/2020 Duration: 43.03 mins Patient history: 50 year old female admitted with DKA, MRSA bacteremia and had sudden worsening of vision.  EEG to evaluate for seizures. Level of alertness: Awake, asleep AEDs during EEG study: None Technical aspects: This EEG study was done with scalp electrodes positioned according to the 10-20 International system of electrode placement. Electrical activity was acquired at a sampling rate of 500Hz  and reviewed with a high frequency filter of 70Hz  and a low frequency filter of 1Hz . EEG data were recorded continuously and digitally stored. Description: During awake state, no clear posterior dominant rhythm was seen.  EEG showed continuous 3 to 5 Hz theta-delta slowing.  There was also generalized and maximal left hemisphere periodic epileptiform discharges with triphasic morphology at 1.5 Hz which at times appeared rhythmic without definite evolution.  IV Ativan was administered on 11/24/2020 at 0 932 after which EEG  showed sleep spindles (12 to 14 Hz), maximal frontocentral region as well as intermittent generalized 3 to 5 Hz theta and delta slowing.  Hyperventilation and photic stimulation were not performed.   ABNORMALITY - Periodic discharges with triphasic morphology, generalized and lateralized left hemisphere ( GPDs) - Continuous slow, generalized IMPRESSION: This study initially showed generalized periodic discharges with triphasic morphology at 1.5 Hz which at times appeared rhythmic.  This EEG pattern is on the ictal and interictal continuum and can also be seen due to toxic-metabolic causes.  IV Ativan 2 mg was administered after which patient fell asleep and EEG pattern improved.  Improvement with benzodiazepine is not necessarily diagnostic of ictal nature.  Therefore, would recommend prolonged EEG monitoring for further evaluation of this EEG pattern. Lora Havens    ECHOCARDIOGRAM COMPLETE  Result Date: 11/24/2020    ECHOCARDIOGRAM REPORT   Patient Name:   CHASSIE PENNIX Date of Exam: 11/24/2020 Medical Rec #:  [id number]          Height:       67.0 in Accession #:    [id number]         Weight:       127.0 lb Date of Birth:  1971/02/13          BSA:          1.667 m Patient Age:    25 years           BP:           150/70 mmHg Patient Gender: F                  HR:           100 bpm. Exam Location:  Inpatient Procedure: 2D Echo, Cardiac Doppler and Color Doppler Indications:    Bacteremia  History:        Patient has prior history of Echocardiogram examinations, most                 recent 08/06/2018. Risk Factors:Hypertension.  Sonographer:    Bernadene Person RDCS Referring Phys: [id number] [place]  1. Left ventricular ejection fraction, by estimation, is 60 to 65%. The left ventricle has normal function. The left ventricle has no regional  wall motion abnormalities. Left ventricular diastolic parameters were normal.  2. Right ventricular systolic function is normal. The right ventricular  size is normal. There is mildly elevated pulmonary artery systolic pressure.  3. The mitral valve is normal in structure. No evidence of mitral valve regurgitation. No evidence of mitral stenosis.  4. The aortic valve is tricuspid. Aortic valve regurgitation is not visualized. No aortic stenosis is present.  5. The inferior vena cava is normal in size with greater than 50% respiratory variability, suggesting right atrial pressure of 3 mmHg. FINDINGS  Left Ventricle: Left ventricular ejection fraction, by estimation, is 60 to 65%. The left ventricle has normal function. The left ventricle has no regional wall motion abnormalities. The left ventricular internal cavity size was normal in size. There is  no left ventricular hypertrophy. Left ventricular diastolic parameters were normal. Right Ventricle: The right ventricular size is normal. Right ventricular systolic function is normal. There is mildly elevated pulmonary artery systolic pressure. The tricuspid regurgitant velocity is 2.99 m/s, and with an assumed right atrial pressure of 3 mmHg, the estimated right ventricular systolic pressure is 71.0 mmHg. Left Atrium: Left atrial size was normal in size. Right Atrium: Right atrial size was normal in size. Pericardium: Trivial pericardial effusion is present. Mitral Valve: The mitral valve is normal in structure. Mild mitral annular calcification. No evidence of mitral valve regurgitation. No evidence of mitral valve stenosis. Tricuspid Valve: The tricuspid valve is normal in structure. Tricuspid valve regurgitation is mild . No evidence of tricuspid stenosis. Aortic Valve: The aortic valve is tricuspid. Aortic valve regurgitation is not visualized. No aortic stenosis is present. Pulmonic Valve: The pulmonic valve was normal in structure. Pulmonic valve regurgitation is not visualized. No evidence of pulmonic stenosis. Aorta: The aortic root is normal in size and structure. Venous: The inferior vena cava is normal in  size with greater than 50% respiratory variability, suggesting right atrial pressure of 3 mmHg. IAS/Shunts: No atrial level shunt detected by color flow Doppler.  LEFT VENTRICLE PLAX 2D LVIDd:         4.30 cm  Diastology LVIDs:         2.70 cm  LV e' medial:  8.00 cm/s LV PW:         1.10 cm  LV e' lateral: 9.74 cm/s LV IVS:        1.00 cm LVOT diam:     1.60 cm LV SV:         51 LV SV Index:   30 LVOT Area:     2.01 cm  RIGHT VENTRICLE RV S prime:     12.00 cm/s TAPSE (M-mode): 2.3 cm LEFT ATRIUM             Index       RIGHT ATRIUM           Index LA diam:        3.30 cm 1.98 cm/m  RA Area:     11.30 cm LA Vol (A2C):   35.8 ml 21.47 ml/m RA Volume:   23.30 ml  13.98 ml/m LA Vol (A4C):   39.3 ml 23.57 ml/m LA Biplane Vol: 38.1 ml 22.85 ml/m  AORTIC VALVE LVOT Vmax:   137.00 cm/s LVOT Vmean:  92.600 cm/s LVOT VTI:    0.252 m  AORTA Ao Root diam: 2.90 cm Ao Asc diam:  3.30 cm TRICUSPID VALVE TR Peak grad:   35.8 mmHg TR Vmax:        299.00  cm/s  SHUNTS Systemic VTI:  0.25 m Systemic Diam: 1.60 cm Kirk Ruths MD Electronically signed by Kirk Ruths MD Signature Date/Time: 11/24/2020/3:20:12 PM    Final      LOS: 5 days   Oren Binet, MD  [facility]    To contact the attending provider between 7A-7P or the covering provider during after hours 7P-7A, please log into the web site [email or web address] and access using universal [place] password for that web site. If you do not have the password, please call the hospital operator.  11/26/2020, 2:07 PM

## 2020-11-27 ENCOUNTER — Inpatient Hospital Stay (HOSPITAL_COMMUNITY): Payer: Medicare HMO

## 2020-11-27 DIAGNOSIS — H539 Unspecified visual disturbance: Secondary | ICD-10-CM

## 2020-11-27 DIAGNOSIS — H53133 Sudden visual loss, bilateral: Secondary | ICD-10-CM

## 2020-11-27 DIAGNOSIS — M869 Osteomyelitis, unspecified: Secondary | ICD-10-CM

## 2020-11-27 LAB — GLUCOSE, CAPILLARY
Glucose-Capillary: 263 mg/dL — ABNORMAL HIGH (ref 70–99)
Glucose-Capillary: 129 mg/dL — ABNORMAL HIGH (ref 70–99)
Glucose-Capillary: 457 mg/dL — ABNORMAL HIGH (ref 70–99)
Glucose-Capillary: 298 mg/dL — ABNORMAL HIGH (ref 70–99)

## 2020-11-27 LAB — CBC
HCT: 32 % — ABNORMAL LOW (ref 36.0–46.0)
Hemoglobin: 10.8 g/dL — ABNORMAL LOW (ref 12.0–15.0)
MCH: 29.9 pg (ref 26.0–34.0)
MCHC: 33.8 g/dL (ref 30.0–36.0)
MCV: 88.6 fL (ref 80.0–100.0)
Platelets: 301 10*3/uL (ref 150–400)
RBC: 3.61 MIL/uL — ABNORMAL LOW (ref 3.87–5.11)
RDW: 14 % (ref 11.5–15.5)
WBC: 7.3 10*3/uL (ref 4.0–10.5)
nRBC: 0 % (ref 0.0–0.2)

## 2020-11-27 LAB — RENAL FUNCTION PANEL
Albumin: 2.8 g/dL — ABNORMAL LOW (ref 3.5–5.0)
Anion gap: 15 (ref 5–15)
BUN: 38 mg/dL — ABNORMAL HIGH (ref 6–20)
CO2: 23 mmol/L (ref 22–32)
Calcium: 8.3 mg/dL — ABNORMAL LOW (ref 8.9–10.3)
Chloride: 93 mmol/L — ABNORMAL LOW (ref 98–111)
Creatinine, Ser: 8.39 mg/dL — ABNORMAL HIGH (ref 0.44–1.00)
GFR, Estimated: 5 mL/min — ABNORMAL LOW (ref >60)
Glucose, Bld: 350 mg/dL — ABNORMAL HIGH (ref 70–99)
Phosphorus: 5.8 mg/dL — ABNORMAL HIGH (ref 2.5–4.6)
Potassium: 4.2 mmol/L (ref 3.5–5.1)
Sodium: 131 mmol/L — ABNORMAL LOW (ref 135–145)

## 2020-11-27 MED ORDER — INSULIN ASPART 100 UNIT/ML IJ SOLN
3.0000 [IU] | Freq: Once | INTRAMUSCULAR | Status: AC
  Administered 2020-11-27: 3 [IU] via SUBCUTANEOUS

## 2020-11-27 MED ORDER — ASPIRIN EC 81 MG PO TBEC
81.0000 mg | DELAYED_RELEASE_TABLET | Freq: Every day | ORAL | Status: DC
  Administered 2020-11-27 – 2020-12-01 (×5): 81 mg via ORAL
  Filled 2020-11-27 (×5): qty 1

## 2020-11-27 MED ORDER — INSULIN ASPART 100 UNIT/ML IJ SOLN
2.0000 [IU] | Freq: Once | INTRAMUSCULAR | Status: AC
  Administered 2020-11-27: 2 [IU] via SUBCUTANEOUS

## 2020-11-27 MED ORDER — INSULIN GLARGINE-YFGN 100 UNIT/ML ~~LOC~~ SOLN
10.0000 [IU] | Freq: Every day | SUBCUTANEOUS | Status: DC
  Filled 2020-11-27: qty 0.1

## 2020-11-27 MED ORDER — INSULIN GLARGINE-YFGN 100 UNIT/ML ~~LOC~~ SOLN
3.0000 [IU] | Freq: Once | SUBCUTANEOUS | Status: AC
  Administered 2020-11-27: 3 [IU] via SUBCUTANEOUS
  Filled 2020-11-27: qty 0.03

## 2020-11-27 MED ORDER — INSULIN ASPART 100 UNIT/ML IJ SOLN
10.0000 [IU] | Freq: Once | INTRAMUSCULAR | Status: AC
  Administered 2020-11-27: 10 [IU] via SUBCUTANEOUS

## 2020-11-27 NOTE — Progress Notes (Signed)
PROGRESS NOTE        PATIENT DETAILS Name: [name] Age: 50 y.o. Sex: female Date of Birth: 1971-04-02 Admit Date: 11/21/2020 Admitting Physician Jacqlyn Krauss, MD YNW:GNFAOZH, Babs Bertin, PA-C  Brief Narrative: Patient is a 50 y.o. female with history of DM-1, diabetic retinopathy, chronic left foot osteomyelitis, ESRD on HD-recent history of MRSA bacteremia-presented with acute metabolic encephalopathy, DKA and left diabetic foot with osteomyelitis.  On 8/25-her encephalopathy finally improved-but was found to have bilateral visual loss with initial concern for seizure like activity on spot EEG-however no seizure noted on LTM EEG, but now felt to be due to BP/CBG fluctuation.  See below for further details.  Significant events: 7/14>> nausea/vomiting-left foot cellulitis-evaluated at urgent care in [place] on 7/14-blood cultures positive for MRSA.  Declined admission.  Subsequently received IV vancomycin with HD from 7/26-8/04 8/22>> presented to [facility] ED with confusion-found to have DKA 8/25>> finally more awake and alert-claims she cannot see out of both eyes.  Spot EEG w discharge from left hemisphere-EEG improved with Ativan 2 mg x 1  Significant studies: 8/22>> x-ray left foot: Osteomyelitis at the second phalange 8/22>> CXR: No pneumonia 8/22>> CT head: No acute intracranial abnormality 8/24>> CT maxillofacial area: No acute infection 8/25>> EEG: Generalized periodic discharges from left hemisphere-given 2 mg of IV Ativan-EEG improved. 8/25>> Echo: EF 60-65%-no obvious vegetations. 8/25>> MRI brain: No acute intracranial pathology 8/25-8/26>> LTM EEG: Cortical dysfunction in the left posterior quadrant-nonspecific etiology-no seizures or definite epileptiform discharges seen.  Antimicrobial therapy: Vancomycin: 8/22>> Cefepime: 8/22>>8/23  Microbiology data: 8/22>> COVID/influenza PCR: Negative 8/22>> blood culture: Negative so far 8/24>>  blood culture: Negative so far  Procedures : None  Consults: ID, nephrology, podiatry, neurology,opthalmology  DVT Prophylaxis : SCDs Start: 11/21/20 1037   Subjective: Patient mentions that she is able to see me this morning.  Denies any headaches.  No nausea vomiting.    Assessment/Plan: Acute metabolic encephalopathy: Multifactorial etiology-DKA/BUN elevation in setting of ESRD/possible seizures-mentation has improved with supportive care-she is now awake and alert.  Mentation remains stable.  Questionable seizures: Initial spot EEG was suspicious for possible seizures-however LTM EEG was negative for seizures.  Neurology recommending to hold off on starting antiepileptics.  Bilateral visual loss: No clear-cut etiology identified-no pathology evident on ocular exam by ophthalmologist.  Per my discussion with primary ophthalmologist on 8/25-patient has a longstanding history of diabetic retinopathy (20/70 on right and 20/50 on left)-history of laser procedures/and eye injections every 8 weeks per primary ophthalmologist). No lesion seen on MRI brain.   Discussed with Dr. Mayford Knife MD on 8/26-suspicion that she may have had small infarcts in her bilateral optic nerves in the setting of blood pressure/CBG fluctuations in a background of significant diabetic retinopathy at baseline.  Recommendations from neurology are to obtain a bilateral carotid Doppler (pending), and to slowly optimize her diabetic/antihypertensive regimen.   Patient mentions that her vision seems to be better this morning compared to yesterday.  Ophthalmology recommending more specialized exam in the outpatient setting after discharge (follows with Ophthalmologist Dr Baird [facility] in [facility]).  Sepsis due to acute osteomyelitis involving the left second phalanx-possibility of underlying MRSA infection: Sepsis physiology has improved.  Patient had positive blood cultures for MRSA in [place] on 7/14-it appears that she declined  admission and apparently received treatment with IV vancomycin with hemodialysis from 7/26-8/4.  Evaluated by podiatry on 8/23-she appears to have more of a chronic osteomyelitis rather than a acute osteomyelitis. Blood cultures on 8/22 negative so far-transthoracic echo without any vegetations.  ID recommending 4 weeks of vancomycin with HD-end date of 12/19/2020.  Facial swelling: Etiology felt to be either due to volume overload-improved after fluid removal with HD on 8/24.  CT maxillofacial area does not show any acute infection.    DKA history of DM 1 on insulin pump (A1c 8.1 on 8/3): DKA resolved with IV insulin-CBGs currently stable with 7 units of Lantus and SSI.  Prior to this hospitalization-patient was on an insulin pump-with her having visual issues-she would likely need to stay on basal/bolus regimen.   CBGs are noted to be elevated.  Will increase the dose of her glargine  Recent Labs    11/26/20 1631 11/26/20 2146 11/27/20 0756  GLUCAP 172* 297* 263*     Diarrhea: Questionable etiology-unclear if patient has bowel related autonomic neuropathy in the setting of diabetes.  C. difficile studies and GI pathogen panel negative.  Will use prn Imodium.  ESRD (TTS): Nephrology following-we will defer HD to nephrology.  Normocytic anemia: Due to ESRD-defer need for EPO/IV iron to nephrology.  Hyperlipidemia: Resume statin when able  Essential hypertension: BP fluctuating-but overall better controlled-continue amlodipine  History of right transmetatarsal amputation  Social issues: Needs social work evaluation (consulted 8/27)-she has bilateral visual loss-previously was on insulin pump.  PT and OT evaluation appears to be pending.  Patient apparently lives with roommates.  May need rehabilitation in a skilled nursing facility.  Diet: Diet Order             Diet renal/carb modified with fluid restriction Diet-HS Snack? Nothing; Fluid restriction: 1200 mL Fluid; Room service  appropriate? Yes; Fluid consistency: Thin  Diet effective now                    Code Status: Full code   Family Communication: Sister-Alicia [id number]-[phone]   Disposition Plan: Status is: Inpatient  Remains inpatient appropriate because:Inpatient level of care appropriate due to severity of illness  Dispo: The patient is from: Home              Anticipated d/c is to: Home              Patient currently is not medically stable to d/c.   Difficult to place patient No   Barriers to Discharge: Resolving encephalopathy-now with seizure disorder-blindness-work-up in progress.  See above.    Antimicrobial agents: Anti-infectives (From admission, onward)    Start     Dose/Rate Route Frequency Ordered Stop   11/23/20 1800  ceFEPIme (MAXIPIME) 1 g in sodium chloride 0.9 % 100 mL IVPB  Status:  Discontinued        1 g 200 mL/hr over 30 Minutes Intravenous Daily-1800 11/23/20 1126 11/23/20 1145   11/23/20 1030  vancomycin (VANCOREADY) IVPB 750 mg/150 mL        750 mg 150 mL/hr over 60 Minutes Intravenous To Hemodialysis 11/23/20 1021 11/23/20 1305   11/23/20 1030  ceFEPIme (MAXIPIME) 2 g in sodium chloride 0.9 % 100 mL IVPB  Status:  Discontinued        2 g 200 mL/hr over 30 Minutes Intravenous To Hemodialysis 11/23/20 1021 11/23/20 1125   11/22/20 1800  ceFEPIme (MAXIPIME) 2 g in sodium chloride 0.9 % 100 mL IVPB  Status:  Discontinued        2 g 200 mL/hr  over 30 Minutes Intravenous Every T-Th-Sa (Hemodialysis) 11/21/20 2018 11/22/20 0816   11/22/20 1800  ceFEPIme (MAXIPIME) 2 g in sodium chloride 0.9 % 100 mL IVPB  Status:  Discontinued        2 g 200 mL/hr over 30 Minutes Intravenous Every T-Th-Sa (1800) 11/22/20 0816 11/23/20 1125   11/22/20 1500  vancomycin (VANCOREADY) IVPB 750 mg/150 mL        750 mg 150 mL/hr over 60 Minutes Intravenous Every T-Th-Sa (Hemodialysis) 11/22/20 1357 12/19/20 2359   11/22/20 1000  ceFEPIme (MAXIPIME) 2 g in sodium chloride 0.9 % 100  mL IVPB  Status:  Discontinued       Note to Pharmacy: Pharmacy to dose   2 g 200 mL/hr over 30 Minutes Intravenous Every 24 hours 11/21/20 1035 11/21/20 2017   11/22/20 0435  vancomycin variable dose per unstable renal function (pharmacist dosing)  Status:  Discontinued         Does not apply See admin instructions 11/22/20 0435 11/23/20 1022   11/21/20 0700  vancomycin (VANCOCIN) IVPB 1000 mg/200 mL premix        1,000 mg 200 mL/hr over 60 Minutes Intravenous  Once 11/21/20 0648 11/21/20 1247   11/21/20 0700  ceFEPIme (MAXIPIME) 2 g in sodium chloride 0.9 % 100 mL IVPB        2 g 200 mL/hr over 30 Minutes Intravenous  Once 11/21/20 0656 11/21/20 1022   11/21/20 0645  metroNIDAZOLE (FLAGYL) IVPB 500 mg        500 mg 100 mL/hr over 60 Minutes Intravenous  Once 11/21/20 0636 11/21/20 0816          MEDICATIONS: Scheduled Meds:  amLODipine  10 mg Oral Daily   calcium acetate  1,334 mg Oral TID WC   Chlorhexidine Gluconate Cloth  6 each Topical Q0600   Chlorhexidine Gluconate Cloth  6 each Topical Q0600   collagenase   Topical Daily   doxercalciferol  9 mcg Intravenous Q T,Th,Sa-HD   insulin aspart  0-6 Units Subcutaneous TID WC   insulin glargine-yfgn  7 Units Subcutaneous Daily   mupirocin ointment   Nasal BID   Continuous Infusions:  vancomycin Stopped (11/26/20 1555)   PRN Meds:.clonazePAM, dextrose, haloperidol lactate, hydrALAZINE, labetalol, melatonin, methocarbamol, ondansetron (ZOFRAN) IV   PHYSICAL EXAM: Vital signs: Vitals:   11/26/20 1629 11/26/20 1944 11/27/20 0000 11/27/20 0753  BP: (!) 173/91 (!) 148/90 (!) 165/78 (!) 157/84  Pulse: 94 90 89 73  Resp: 17 18 14 16   Temp: 97.6 F (36.4 C) 98.1 F (36.7 C) (!) 97.5 F (36.4 C) 98.5 F (36.9 C)  TempSrc: Oral Oral Axillary Oral  SpO2: 97% 98% 98% 98%  Weight:      Height:       Filed Weights   11/23/20 1347 11/26/20 1320 11/26/20 1555  Weight: 57.6 kg 58.1 kg 56.9 kg   Body mass index is 19.65  kg/m.   General appearance: Awake alert.  In no distress Resp: Clear to auscultation bilaterally.  Normal effort Cardio: S1-S2 is normal regular.  No S3-S4.  No rubs murmurs or bruit GI: Abdomen is soft.  Nontender nondistended.  Bowel sounds are present normal.  No masses organomegaly Extremities: No edema.  Full range of motion of lower extremities. Neurologic: Alert and oriented x3.  No focal neurological deficits.    I have personally reviewed following labs and imaging studies  LABORATORY DATA: CBC: Recent Labs  Lab 11/21/20 0550 11/21/20 1227 11/22/20 0714 11/23/20 0141  11/24/20 0200 11/25/20 0811 11/26/20 0116 11/27/20 0205  WBC 10.3 7.8   < > 7.9 7.5 5.1 6.5 7.3  NEUTROABS 8.6* 6.0  --   --   --   --   --   --   HGB 11.0* 10.2*   < > 9.6* 10.6* 11.1* 11.1* 10.8*  HCT 32.8* 30.6*   < > 28.6* 32.8* 34.3* 33.7* 32.0*  MCV 89.4 88.4   < > 88.8 91.9 91.0 90.1 88.6  PLT 246 226   < > 193 230 274 297 301   < > = values in this interval not displayed.     Basic Metabolic Panel: Recent Labs  Lab 11/23/20 0735 11/24/20 0200 11/25/20 0811 11/26/20 0116 11/27/20 0205  NA 130* 136 137 133* 131*  K 4.9 3.7 4.2 4.5 4.2  CL 95* 99 100 95* 93*  CO2 16* 16* 20* 18* 23  GLUCOSE 140* 160* 103* 230* 350*  BUN 79* 31* 39* 47* 38*  CREATININE 9.95* 6.35* 9.07* 10.24* 8.39*  CALCIUM 8.2* 8.4* 8.0* 8.0* 8.3*  PHOS 6.0* 4.8* 6.7* 7.2* 5.8*     GFR: Estimated Creatinine Clearance: 7.3 mL/min (A) (by C-G formula based on SCr of 8.39 mg/dL (H)).  Liver Function Tests: Recent Labs  Lab 11/21/20 0550 11/23/20 0735 11/24/20 0200 11/25/20 0811 11/26/20 0116 11/27/20 0205  AST 28 26  --   --   --   --   ALT 45* 32  --   --   --   --   ALKPHOS 242* 156*  --   --   --   --   BILITOT 0.7 0.9  --   --   --   --   PROT 8.1 6.6  --   --   --   --   ALBUMIN 3.9 3.0*  3.1* 3.0* 2.8* 2.8* 2.8*    Recent Labs  Lab 11/21/20 0550  LIPASE 40    Recent Labs  Lab  11/23/20 0852  AMMONIA 51*     Coagulation Profile: Recent Labs  Lab 11/21/20 0550  INR 1.0      Urine analysis:    Component Value Date/Time   COLORURINE YELLOW 11/23/2020 0410   APPEARANCEUR CLEAR 11/23/2020 0410   LABSPEC 1.012 11/23/2020 0410   PHURINE 7.0 11/23/2020 0410   GLUCOSEU >=500 (A) 11/23/2020 0410   GLUCOSEU 500 (A) 01/26/2015 1200   HGBUR NEGATIVE 11/23/2020 0410   BILIRUBINUR NEGATIVE 11/23/2020 0410   KETONESUR 5 (A) 11/23/2020 0410   PROTEINUR 100 (A) 11/23/2020 0410   UROBILINOGEN 0.2 01/26/2015 1200   NITRITE NEGATIVE 11/23/2020 0410   LEUKOCYTESUR NEGATIVE 11/23/2020 0410    Sepsis Labs: Lactic Acid, Venous    Component Value Date/Time   LATICACIDVEN 1.6 11/21/2020 1227    MICROBIOLOGY: Recent Results (from the past 240 hour(s))  Blood culture (routine single)     Status: None   Collection Time: 11/21/20  5:50 AM   Specimen: BLOOD  Result Value Ref Range Status   Specimen Description   Final    BLOOD SITE NOT SPECIFIED Performed at [facility] [address], [place], [place] [address]    Special Requests   Final    BOTTLES DRAWN AEROBIC AND ANAEROBIC Blood Culture adequate volume Performed at [facility] [address], [place], [place] [address]    Culture   Final    NO GROWTH 5 DAYS Performed at [facility], [place] [address],  [place], [place] [address]    Report Status 11/26/2020 FINAL  Final  Resp Panel by RT-PCR (Flu A&B, Covid) Nasopharyngeal Swab     Status: None   Collection Time: 11/21/20  8:11 AM   Specimen: Nasopharyngeal Swab; Nasopharyngeal(NP) swabs in vial transport medium  Result Value Ref Range Status   SARS Coronavirus 2 by RT PCR NEGATIVE NEGATIVE Final    Comment: (NOTE) SARS-CoV-2 target nucleic acids are NOT DETECTED.  The SARS-CoV-2 RNA is generally detectable in upper respiratory specimens during the acute phase of infection. The lowest concentration of  SARS-CoV-2 viral copies this assay can detect is 138 copies/mL. A negative result does not preclude SARS-Cov-2 infection and should not be used as the sole basis for treatment or other patient management decisions. A negative result may occur with  improper specimen collection/handling, submission of specimen other than nasopharyngeal swab, presence of viral mutation(s) within the areas targeted by this assay, and inadequate number of viral copies(<138 copies/mL). A negative result must be combined with clinical observations, patient history, and epidemiological information. The expected result is Negative.  Fact Sheet for Patients:  EntrepreneurPulse.com.au  Fact Sheet for Healthcare Providers:  IncredibleEmployment.be  This test is no t yet approved or cleared by the [place] FDA and  has been authorized for detection and/or diagnosis of SARS-CoV-2 by FDA under an Emergency Use Authorization (EUA). This EUA will remain  in effect (meaning this test can be used) for the duration of the COVID-19 declaration under Section 564(b)(1) of the Act, 21 U.S.C.section 360bbb-3(b)(1), unless the authorization is terminated  or revoked sooner.       Influenza A by PCR NEGATIVE NEGATIVE Final   Influenza B by PCR NEGATIVE NEGATIVE Final    Comment: (NOTE) The Xpert Xpress SARS-CoV-2/FLU/RSV plus assay is intended as an aid in the diagnosis of influenza from Nasopharyngeal swab specimens and should not be used as a sole basis for treatment. Nasal washings and aspirates are unacceptable for Xpert Xpress SARS-CoV-2/FLU/RSV testing.  Fact Sheet for Patients: EntrepreneurPulse.com.au  Fact Sheet for Healthcare Providers: IncredibleEmployment.be  This test is not yet approved or cleared by the [place] FDA and has been authorized for detection and/or diagnosis of SARS-CoV-2 by FDA under an Emergency Use  Authorization (EUA). This EUA will remain in effect (meaning this test can be used) for the duration of the COVID-19 declaration under Section 564(b)(1) of the Act, 21 U.S.C. section 360bbb-3(b)(1), unless the authorization is terminated or revoked.  Performed at [facility], [place] [address] [place], [place] [address]   Culture, blood (single)     Status: None   Collection Time: 11/21/20  3:45 PM   Specimen: BLOOD RIGHT HAND  Result Value Ref Range Status   Specimen Description BLOOD RIGHT HAND  Final   Special Requests IN PEDIATRIC BOTTLE Blood Culture adequate volume  Final   Culture   Final    NO GROWTH 5 DAYS Performed at [facility], [place] [address], [place], [place] [address]    Report Status 11/26/2020 FINAL  Final  MRSA Next Gen by PCR, Nasal     Status: Abnormal   Collection Time: 11/22/20 10:57 AM   Specimen: Nasal Mucosa; Nasal Swab  Result Value Ref Range Status   MRSA by PCR Next Gen DETECTED (A) NOT DETECTED Final    Comment: RESULT CALLED TO, READ BACK BY AND VERIFIED WITH: Maryan Puls RN 11/22/2020 @1417  BY JW (NOTE) The GeneXpert MRSA Assay (FDA approved for NASAL  specimens only), is one component of a comprehensive MRSA colonization surveillance program. It is not intended to diagnose MRSA infection nor to guide or monitor treatment for MRSA infections. Test performance is not FDA approved in patients less than 18 years old. Performed at [facility], [place] [address], [place], [place] [address]   C Difficile Quick Screen w PCR reflex     Status: None   Collection Time: 11/22/20  4:33 PM   Specimen: STOOL  Result Value Ref Range Status   C Diff antigen NEGATIVE NEGATIVE Final   C Diff toxin NEGATIVE NEGATIVE Final   C Diff interpretation No C. difficile detected.  Final    Comment: Performed at [facility], [place] [address] [place], [place] [address]  Gastrointestinal Panel by PCR , Stool     Status: None    Collection Time: 11/22/20  4:33 PM   Specimen: STOOL  Result Value Ref Range Status   Campylobacter species NOT DETECTED NOT DETECTED Final   Plesimonas shigelloides NOT DETECTED NOT DETECTED Final   Salmonella species NOT DETECTED NOT DETECTED Final   Yersinia enterocolitica NOT DETECTED NOT DETECTED Final   Vibrio species NOT DETECTED NOT DETECTED Final   Vibrio cholerae NOT DETECTED NOT DETECTED Final   Enteroaggregative E coli (EAEC) NOT DETECTED NOT DETECTED Final   Enteropathogenic E coli (EPEC) NOT DETECTED NOT DETECTED Final   Enterotoxigenic E coli (ETEC) NOT DETECTED NOT DETECTED Final   Shiga like toxin producing E coli (STEC) NOT DETECTED NOT DETECTED Final   Shigella/Enteroinvasive E coli (EIEC) NOT DETECTED NOT DETECTED Final   Cryptosporidium NOT DETECTED NOT DETECTED Final   Cyclospora cayetanensis NOT DETECTED NOT DETECTED Final   Entamoeba histolytica NOT DETECTED NOT DETECTED Final   Giardia lamblia NOT DETECTED NOT DETECTED Final   Adenovirus F40/41 NOT DETECTED NOT DETECTED Final   Astrovirus NOT DETECTED NOT DETECTED Final   Norovirus GI/GII NOT DETECTED NOT DETECTED Final   Rotavirus A NOT DETECTED NOT DETECTED Final   Sapovirus (I, II, IV, and V) NOT DETECTED NOT DETECTED Final    Comment: Performed at [facility], [address] [place], [place] [address]  Culture, blood (routine x 2)     Status: None (Preliminary result)   Collection Time: 11/23/20  3:26 PM   Specimen: BLOOD RIGHT HAND  Result Value Ref Range Status   Specimen Description BLOOD RIGHT HAND  Final   Special Requests   Final    BOTTLES DRAWN AEROBIC AND ANAEROBIC Blood Culture results may not be optimal due to an excessive volume of blood received in culture bottles   Culture   Final    NO GROWTH 3 DAYS Performed at [facility], [address], [place], [place] [address]    Report Status PENDING  Incomplete  Culture, blood (routine x 2)     Status: None (Preliminary  result)   Collection Time: 11/23/20  3:27 PM   Specimen: BLOOD RIGHT HAND  Result Value Ref Range Status   Specimen Description BLOOD RIGHT HAND  Final   Special Requests   Final    BOTTLES DRAWN AEROBIC AND ANAEROBIC Blood Culture results may not be optimal due to an excessive volume of blood received in culture bottles   Culture   Final    NO GROWTH 3 DAYS Performed at [facility], [place] [address] [place], [place] [address]    Report Status PENDING  Incomplete    RADIOLOGY  STUDIES/RESULTS: No results found.   LOS: 6 days   Bonnielee Haff, MD  [facility]    To contact the attending provider between 7A-7P or the covering provider during after hours 7P-7A, please log into the web site [email or web address] and access using universal [place] password for that web site. If you do not have the password, please call the hospital operator.  11/27/2020, 11:16 AM

## 2020-11-27 NOTE — Progress Notes (Signed)
OT Cancellation Note  Patient Details Name: KATORI WIRSING MRN: [id number] DOB: 09-20-1970   Cancelled Treatment:    Reason Eval/Treat Not Completed: Other (comment) (Pt getting ultrasound and family visiting. OT to attempt to come back.)  Jefferey Pica, OTR/L [place] Pager: [phone] Office: [place]  C 11/27/2020, 10:57 AM

## 2020-11-27 NOTE — Progress Notes (Signed)
Pt got oob this afternoon to chair & was redirected back to bed, also not to get oob with assist. Pt stated she got up because PT got her oob earlier. Explained to pt for her safety she needs assist by staff. On yesterday she could not stand to transfer to bsc with assist from staff. Pt relied, "will you must not have read all my doctor's notes, I'm not compliant." Pt's CBG was 457 this pm & noted to have gum at bedside that was not sugar free. Pt ws made aware that the gum was not sugar free. Pt still states that she can't see when she is laying down  but can see when she is sitting up.

## 2020-11-27 NOTE — Evaluation (Signed)
Physical Therapy Evaluation Patient Details Name: [name] MRN: [id number] DOB: [birth year] Today's Date: 11/27/2020   History of Present Illness  50 y.o. female presented 11/21/20  with acute metabolic encephalopathy, DKA left diabetic foot with osteomyelitis. On 8/25-her encephalopathy finally improved-but was found to have bilateral visual loss /26-suspicion that she may have had small infarcts in her bilateral optic nerves in the setting of blood pressure/CBG fluctuations in a background of significant diabetic retinopathy at baseline. Refusing left second toe amputation. PMH: DM-1, diabetic retinopathy, chronic left foot osteomyelitis, ESRD on HD-recent history of MRSA bacteremia- and left diabetic foot with osteomyelitis.  Clinical Impression  PTA pt living in two story condo with ramped entrance, report bed and bath upstairs and downstairs but mainly uses 1st floor. Pt reports use of manual wheelchair for household mobility, and electric wheelchair for community mobility, uses [employer] for transportation. Pt is currently limited in safe mobility by visual deficit, total darkness in supine and blurry vision after sitting up for approx 3 min, also pt limited by fragility of bilateral feet in the presence of decreased safety awareness. Pt is currently mod I for bed mobility and min guard for stand pivot transfers to and from [place] chair to simulate wheelchair transfers. PT recommending SNF level rehab at discharge to improve safety with mobility in presence of visual deficits. PT will continue to follow acutely.     Follow Up Recommendations SNF;Supervision for mobility/OOB    Equipment Recommendations  None recommended by PT    Recommendations for Other Services OT consult     Precautions / Restrictions Precautions Precautions: Fall Restrictions Weight Bearing Restrictions: No      Mobility  Bed Mobility Overal bed mobility: Modified Independent             General  bed mobility comments: requires cuing due to visual impairment, no physical assist    Transfers Overall transfer level: Needs assistance   Transfers: Stand Pivot Transfers   Stand pivot transfers: Min guard       General transfer comment: minguard for safety, multimodal cues for pivot to/from straightback chair on L  Ambulation/Gait             General Gait Details: deferred due to pt report she is only to pivot to wheelchair        Balance Overall balance assessment: Needs assistance Sitting-balance support: Feet supported;No upper extremity supported Sitting balance-Leahy Scale: Normal         Standing balance comment: only pivots to wheelchair                             Pertinent Vitals/Pain Pain Assessment: No/denies pain    Home Living Family/patient expects to be discharged to:: Private residence Living Arrangements: Other (Comment) (condo) Available Help at Discharge: Friend(s);Available 24 hours/day (2 roommates) Type of Home: House Home Access: Ramped entrance     Home Layout: Two level;Able to live on main level with bedroom/bathroom Home Equipment: Wheelchair - manual;Wheelchair - power;Hand held shower head;Grab bars - tub/shower;Grab bars - toilet (uses stool in shower)      Prior Function Level of Independence: Needs assistance   Gait / Transfers Assistance Needed: uses manual wheelchair in home and electric wheelchair in community  ADL's / Homemaking Assistance Needed: independent in [place], uses [employer] for transportation        Hand Dominance   Dominant Hand: Right    Extremity/Trunk Assessment  Upper Extremity Assessment Upper Extremity Assessment: Overall WFL for tasks assessed    Lower Extremity Assessment Lower Extremity Assessment: RLE deficits/detail;LLE deficits/detail RLE Deficits / Details: 5th and hallux amputation, hip knee and ankle ROM WFL, strength grossly 4/5 RLE Sensation: history of  peripheral neuropathy RLE Coordination: WNL LLE Deficits / Details: L transmetatarsal amputation, hip knee and ankle ROM WFL, strength grossly 4/5 LLE Sensation: history of peripheral neuropathy LLE Coordination: WNL    Cervical / Trunk Assessment Cervical / Trunk Assessment: Normal  Communication   Communication: No difficulties  Cognition Arousal/Alertness: Awake/alert Behavior During Therapy: WFL for tasks assessed/performed Overall Cognitive Status: Impaired/Different from baseline Area of Impairment: Problem solving;Safety/judgement                         Safety/Judgement: Decreased awareness of safety   Problem Solving: Requires verbal cues;Requires tactile cues        General Comments General comments (skin integrity, edema, etc.): Pt reports no vision in supine, pt came to longsitting in bed and after about 3 min vision returning, not clear but able to make out therapist face, seems to have tunnel vision        Assessment/Plan    PT Assessment Patient needs continued PT services  PT Problem List Decreased activity tolerance;Decreased mobility;Decreased cognition;Decreased safety awareness;Impaired sensation;Decreased skin integrity;Other (comment) (impaired vision)       PT Treatment Interventions DME instruction;Functional mobility training;Therapeutic activities;Balance training;Therapeutic exercise;Cognitive remediation;Patient/family education;Wheelchair mobility training    PT Goals (Current goals can be found in the Care Plan section)  Acute Rehab PT Goals Patient Stated Goal: get back to her dogs PT Goal Formulation: With patient Time For Goal Achievement: 12/11/20 Potential to Achieve Goals: Fair    Frequency Min 3X/week   Barriers to discharge Decreased caregiver support         AM-PAC PT "6 Clicks" Mobility  Outcome Measure Help needed turning from your back to your side while in a flat bed without using bedrails?: None Help needed  moving from lying on your back to sitting on the side of a flat bed without using bedrails?: None Help needed moving to and from a bed to a chair (including a wheelchair)?: A Little Help needed standing up from a chair using your arms (e.g., wheelchair or bedside chair)?: A Little Help needed to walk in hospital room?: Total Help needed climbing 3-5 steps with a railing? : Total 6 Click Score: 16    End of Session Equipment Utilized During Treatment: Gait belt Activity Tolerance: Patient tolerated treatment well Patient left: in bed;with call bell/phone within reach;with bed alarm set Nurse Communication: Mobility status;Other (comment) (request for sugar free pudding) PT Visit Diagnosis: Other abnormalities of gait and mobility (R26.89);Difficulty in walking, not elsewhere classified (R26.2);Other symptoms and signs involving the nervous system (W23.762)    Time: 8315-1761 PT Time Calculation (min) (ACUTE ONLY): 27 min   Charges:   PT Evaluation $PT Eval Moderate Complexity: 1 Mod PT Treatments $Therapeutic Activity: 8-22 mins        Shermika Balthaser B. Migdalia Dk PT, DPT Acute Rehabilitation Services Pager [phone] Office [phone]   [place] 11/27/2020, 4:47 PM

## 2020-11-27 NOTE — Progress Notes (Signed)
Carotid duplex bilateral study completed.   Please see CV Proc for preliminary results.   Mong Neal, RDMS, RVT  

## 2020-11-27 NOTE — Progress Notes (Signed)
Glucose on am lab noted to be 350. Pt is on sensitive SSI ordered with meals. HS SSI is not ordered. Notified night coverage from [facility] hospitalist, Dr T. Opyd of this information. 1x order received for 3 units Novolog aspart.... which was given. Pt sleeping but easily arouses to name called, updated on now insulin order/ BS level.

## 2020-11-27 NOTE — Progress Notes (Signed)
[facility] Progress Note   Subjective:   Patient seen and examined at bedside.  Continues to have cramping and pain, overall unchanged.  Denies CP, SOB, abdominal pain, n/v/d, weakness and fatigue.  Nurse reports she has been picking at her scabs and eating them.   Objective Vitals:   11/26/20 1629 11/26/20 1944 11/27/20 0000 11/27/20 0753  BP: (!) 173/91 (!) 148/90 (!) 165/78 (!) 157/84  Pulse: 94 90 89 73  Resp: 17 18 14 16   Temp: 97.6 F (36.4 C) 98.1 F (36.7 C) (!) 97.5 F (36.4 C) 98.5 F (36.9 C)  TempSrc: Oral Oral Axillary Oral  SpO2: 97% 98% 98% 98%  Weight:      Height:       Physical Exam General:chronically ill appearing female in NAD Heart:RRR, no mrg Lungs:CTAB, nml WOB on RA Abdomen:soft, NTND Extremities:no LE edema, L foot dressed Dialysis Access: LU AVF +b/t   Filed Weights   11/23/20 1347 11/26/20 1320 11/26/20 1555  Weight: 57.6 kg 58.1 kg 56.9 kg    Intake/Output Summary (Last 24 hours) at 11/27/2020 1216 Last data filed at 11/27/2020 1008 Gross per 24 hour  Intake 320 ml  Output --  Net 320 ml    Additional Objective Labs: Basic Metabolic Panel: Recent Labs  Lab 11/25/20 0811 11/26/20 0116 11/27/20 0205  NA 137 133* 131*  K 4.2 4.5 4.2  CL 100 95* 93*  CO2 20* 18* 23  GLUCOSE 103* 230* 350*  BUN 39* 47* 38*  CREATININE 9.07* 10.24* 8.39*  CALCIUM 8.0* 8.0* 8.3*  PHOS 6.7* 7.2* 5.8*   Liver Function Tests: Recent Labs  Lab 11/21/20 0550 11/23/20 0735 11/24/20 0200 11/25/20 0811 11/26/20 0116 11/27/20 0205  AST 28 26  --   --   --   --   ALT 45* 32  --   --   --   --   ALKPHOS 242* 156*  --   --   --   --   BILITOT 0.7 0.9  --   --   --   --   PROT 8.1 6.6  --   --   --   --   ALBUMIN 3.9 3.0*  3.1*   < > 2.8* 2.8* 2.8*   < > = values in this interval not displayed.   Recent Labs  Lab 11/21/20 0550  LIPASE 40   CBC: Recent Labs  Lab 11/21/20 0550 11/21/20 1227 11/22/20 0714 11/23/20 0141  11/24/20 0200 11/25/20 0811 11/26/20 0116 11/27/20 0205  WBC 10.3 7.8   < > 7.9 7.5 5.1 6.5 7.3  NEUTROABS 8.6* 6.0  --   --   --   --   --   --   HGB 11.0* 10.2*   < > 9.6* 10.6* 11.1* 11.1* 10.8*  HCT 32.8* 30.6*   < > 28.6* 32.8* 34.3* 33.7* 32.0*  MCV 89.4 88.4   < > 88.8 91.9 91.0 90.1 88.6  PLT 246 226   < > 193 230 274 297 301   < > = values in this interval not displayed.   Blood Culture    Component Value Date/Time   SDES BLOOD RIGHT HAND 11/23/2020 1527   SPECREQUEST  11/23/2020 1527    BOTTLES DRAWN AEROBIC AND ANAEROBIC Blood Culture results may not be optimal due to an excessive volume of blood received in culture bottles   CULT  11/23/2020 1527    NO GROWTH 3 DAYS Performed at [facility]  Lab, [address] [place], [place] [address]    REPTSTATUS PENDING 11/23/2020 1527    CBG: Recent Labs  Lab 11/26/20 0916 11/26/20 1204 11/26/20 1631 11/26/20 2146 11/27/20 0756  GLUCAP 282* 207* 172* 297* 263*    Studies/Results: VAS US CAROTID  Result Date: 11/27/2020 Carotid Arterial Duplex Study Patient Name:  [name]  Date of Exam:   11/27/2020 Medical Rec #: [id number]           Accession #:    [id number] Date of Birth: 02-14-1971           Patient Gender: F Patient Age:   50 years Exam Location:  [facility] Procedure:      VAS US CAROTID Referring Phys: Lesleigh Noe --------------------------------------------------------------------------------  Indications:      Visual disturbance. Risk Factors:     Hypertension, hyperlipidemia, Diabetes, past history of                   smoking. Comparison Study: No prior studies. Performing Technologist: Darlin Coco RDMS, RVT  Examination Guidelines: A complete evaluation includes B-mode imaging, spectral Doppler, color Doppler, and power Doppler as needed of all accessible portions of each vessel. Bilateral testing is considered an integral part of a complete examination. Limited examinations for  reoccurring indications may be performed as noted.  Right Carotid Findings: +----------+--------+--------+--------+------------------+--------+           PSV cm/sEDV cm/sStenosisPlaque DescriptionComments +----------+--------+--------+--------+------------------+--------+ CCA Prox  77      11                                         +----------+--------+--------+--------+------------------+--------+ CCA Distal72      18                                         +----------+--------+--------+--------+------------------+--------+ ICA Prox  47      14      1-39%   heterogenous               +----------+--------+--------+--------+------------------+--------+ ICA Distal65      21                                         +----------+--------+--------+--------+------------------+--------+ ECA       83      11                                         +----------+--------+--------+--------+------------------+--------+ +----------+--------+-------+----------------+-------------------+           PSV cm/sEDV cmsDescribe        Arm Pressure (mmHG) +----------+--------+-------+----------------+-------------------+ [id number]             Multiphasic, WNL                    +----------+--------+-------+----------------+-------------------+ +---------+--------+--+--------+--+---------+ VertebralPSV cm/s40EDV cm/s12Antegrade +---------+--------+--+--------+--+---------+  Left Carotid Findings: +----------+--------+--------+--------+-------------------------+--------+           PSV cm/sEDV cm/sStenosisPlaque Description       Comments +----------+--------+--------+--------+-------------------------+--------+ CCA Prox  88      14                                                +----------+--------+--------+--------+-------------------------+--------+  CCA Distal63      14                                                 +----------+--------+--------+--------+-------------------------+--------+ ICA Prox  89      27      1-39%   heterogenous and calcific         +----------+--------+--------+--------+-------------------------+--------+ ICA Distal104     32                                                +----------+--------+--------+--------+-------------------------+--------+ ECA       91      9                                                 +----------+--------+--------+--------+-------------------------+--------+ +----------+--------+--------+----------------+-------------------+           PSV cm/sEDV cm/sDescribe        Arm Pressure (mmHG) +----------+--------+--------+----------------+-------------------+ [id number]             Multiphasic, WNL                    +----------+--------+--------+----------------+-------------------+ +---------+--------+--+--------+--+---------+ VertebralPSV cm/s50EDV cm/s13Antegrade +---------+--------+--+--------+--+---------+   Summary: Right Carotid: Velocities in the right ICA are consistent with a 1-39% stenosis. Left Carotid: Velocities in the left ICA are consistent with a 1-39% stenosis. Vertebrals:  Bilateral vertebral arteries demonstrate antegrade flow. Subclavians: Normal flow hemodynamics were seen in bilateral subclavian              arteries. *See table(s) above for measurements and observations.     Preliminary     Medications:  vancomycin Stopped (11/26/20 1555)    amLODipine  10 mg Oral Daily   aspirin EC  81 mg Oral Daily   calcium acetate  1,334 mg Oral TID WC   Chlorhexidine Gluconate Cloth  6 each Topical Q0600   Chlorhexidine Gluconate Cloth  6 each Topical Q0600   collagenase   Topical Daily   doxercalciferol  9 mcg Intravenous Q T,Th,Sa-HD   insulin aspart  0-6 Units Subcutaneous TID WC   [START ON 11/28/2020] insulin glargine-yfgn  10 Units Subcutaneous Daily   insulin glargine-yfgn  3 Units Subcutaneous Once    mupirocin ointment   Nasal BID    Dialysis Orders: AF TTS   4h  56kg  2/2 bath Hep none LUA AVF   - hect 9 ug IV tiw     Problem/Plan DKA / type 1 DM -  stabilizing plans per admit. Please do not use LR or NS fluids without renal service approval.  OK to use straight D5W or D10W for BS support as needed.  Hyponatremia/ hyperkalemia - Na 131, K 4.2 today.   ESRD - on HD TTS.  Off schedule last week due to staffing issues and increased patient volume. HD yesterday per regular schedule.  Next HD on 8/30.  BP/ volume - BP's variable, in goal this AM.  Amlodipine started, on labetalol and hydralazine IV prn. Net UF 2.5L yesterday.  Does not appear grossly volume overloaded.  Visual loss- seen by ophthalmology/neuro, thought  to be due to glucose/BP fluctuations.  Plan for further evaluation as OP. AMS - improved.  thought to be due to metabolic dearrangements L foot osteomyelitis -Plan for 4 weeks vancomycin IV with HD with end date 12/19/20.  ID signed off.  Nonepileptic status epilepticus - admit/neuro work-up Anemia ckd - Hb 10.8 No ESA. 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  Phos elevated. Restart binders. Continue VDRA  Jen Mow, PA-C [facility] 11/27/2020,12:16 PM  LOS: 6 days

## 2020-11-27 NOTE — Progress Notes (Signed)
PODIATRY CONSULTATION  NAME [name] MRN [id number] DOB 06/15/1970 DOA 11/21/2020   Brief narrative: 50 year old female PMHx DM type I, diabetic retinopathy, chronic osteomyelitis of the bilateral feet admitted 11/21/2020 for acute metabolic encephalopathy and DKA with left second toe osteomyelitis.  At the time of original consult, 11/22/2020, patient was nonresponsive.  This morning the patient is resting at bedside quite comfortably and we had a pleasant conversation.  Past Medical History:  Diagnosis Date   Anemia    Arthritis    Bronchitis    C. difficile diarrhea 09/26/2014   Cataracts, both eyes    had surgery to remove   Cellulitis of right foot 06/02/2014   CKD (chronic kidney disease) stage 3, GFR 30-59 ml/min (HCC)    Dialysis T, TH, Sat   Diabetic ulcer of right foot ([place]) 06/02/2014   DVT (deep venous thrombosis) ([place]) 10/2014   one in each one in leg- PICC line , one in right and left arm   H/O seasonal allergies    History of blood transfusion    Hypercholesteremia    Hypertension    Left foot infection    Neuromuscular disorder (HCC)    lower legs and feet   Neuropathy in diabetes [facility])    Peripheral vascular disease (HCC)    Sleep apnea    does not use cpap   Staphylococcus aureus bacteremia 10/2014   Type 1 diabetes ([place])    onset age 81    CBC Latest Ref Rng & Units 11/27/2020 11/26/2020 11/25/2020  WBC 4.0 - 10.5 K/uL 7.3 6.5 5.1  Hemoglobin 12.0 - 15.0 g/dL 10.8(L) 11.1(L) 11.1(L)  Hematocrit 36.0 - 46.0 % 32.0(L) 33.7(L) 34.3(L)  Platelets 150 - 400 K/uL 301 297 274    BMP Latest Ref Rng & Units 11/27/2020 11/26/2020 11/25/2020  Glucose 70 - 99 mg/dL 350(H) 230(H) 103(H)  BUN 6 - 20 mg/dL 38(H) 47(H) 39(H)  Creatinine 0.44 - 1.00 mg/dL 8.39(H) 10.24(H) 9.07(H)  BUN/Creat Ratio 6 - 22 (calc) - - -  Sodium 135 - 145 mmol/L 131(L) 133(L) 137  Potassium 3.5 - 5.1 mmol/L 4.2 4.5 4.2  Chloride 98 - 111 mmol/L 93(L) 95(L) 100  CO2 22 - 32 mmol/L 23  18(L) 20(L)  Calcium 8.9 - 10.3 mg/dL 8.3(L) 8.0(L) 8.0(L)    Physical Exam: General: The patient is alert and oriented x3 in no acute distress.   Dermatology: Wound noted over the left second toe at the location of the osteomyelitis  Vascular: Vascular status intact  Neurological: Sensation absent  Musculoskeletal Exam: History of prior osteomyelitis and partial amputations bilateral feet  Radiographic exam LT foot 11/21/2020: FINDINGS: Generalized osteopenia. 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 There is soft tissue swelling with extensive bony erosion involving the distal second digit. The head of the proximal phalanx is eroded and the other phalanges are barely visible. No opaque foreign body. Similar mildly swollen appearance of the great toe stump.   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    ASSESSMENT/PLAN OF CARE Osteomyelitis left second toe -Had a discussion with the patient regarding osteomyelitis of the left second toe.  Amputation is recommended.  Patient refuses amputation of the toe.  She says she has had success outpatient using Medihoney wound ointment.  Patient states that she is going to resume the Medihoney once she is discharged -Explained to the patient the risks and concerns of the blood cultures and positive findings of MRSA secondary  to the toe infection.  Patient understands but still refuses to amputation at this time -Continue with infectious disease recommendations of 4 weeks vancomycin w/ HD - end date of 12/19/2020 -Follow-up in office post discharge     Please contact me directly with any questions or concerns.  Cell [phone]   Edrick Kins, DPM [facility]  Dr. Edrick Kins, DPM    [address] [place], [place] [address]                Office [phone]  Fax [phone]

## 2020-11-27 NOTE — Progress Notes (Signed)
Patient assessed for night shift. Pt awake & alert., eyes open looking blankly into the room. Physician note documents visual loss; on assessment pt states she cannot see out of either eye. States what she sees "is a bright light". Today on day shift and continuing now --  pt states after she sits up a few minutes that she is able to see a little bit, but when she is laying down that she does not see.

## 2020-11-28 LAB — CBC
HCT: 31.4 % — ABNORMAL LOW (ref 36.0–46.0)
Hemoglobin: 10.3 g/dL — ABNORMAL LOW (ref 12.0–15.0)
MCH: 29.2 pg (ref 26.0–34.0)
MCHC: 32.8 g/dL (ref 30.0–36.0)
MCV: 89 fL (ref 80.0–100.0)
Platelets: 321 10*3/uL (ref 150–400)
RBC: 3.53 MIL/uL — ABNORMAL LOW (ref 3.87–5.11)
RDW: 13.9 % (ref 11.5–15.5)
WBC: 6.4 10*3/uL (ref 4.0–10.5)
nRBC: 0 % (ref 0.0–0.2)

## 2020-11-28 LAB — GLUCOSE, CAPILLARY
Glucose-Capillary: 128 mg/dL — ABNORMAL HIGH (ref 70–99)
Glucose-Capillary: 152 mg/dL — ABNORMAL HIGH (ref 70–99)
Glucose-Capillary: 36 mg/dL — CL (ref 70–99)
Glucose-Capillary: 415 mg/dL — ABNORMAL HIGH (ref 70–99)
Glucose-Capillary: 435 mg/dL — ABNORMAL HIGH (ref 70–99)
Glucose-Capillary: 68 mg/dL — ABNORMAL LOW (ref 70–99)
Glucose-Capillary: 78 mg/dL (ref 70–99)

## 2020-11-28 LAB — RENAL FUNCTION PANEL
Albumin: 2.6 g/dL — ABNORMAL LOW (ref 3.5–5.0)
Anion gap: 15 (ref 5–15)
BUN: 62 mg/dL — ABNORMAL HIGH (ref 6–20)
CO2: 22 mmol/L (ref 22–32)
Calcium: 8.2 mg/dL — ABNORMAL LOW (ref 8.9–10.3)
Chloride: 91 mmol/L — ABNORMAL LOW (ref 98–111)
Creatinine, Ser: 10.14 mg/dL — ABNORMAL HIGH (ref 0.44–1.00)
GFR, Estimated: 4 mL/min — ABNORMAL LOW (ref >60)
Glucose, Bld: 277 mg/dL — ABNORMAL HIGH (ref 70–99)
Phosphorus: 5.8 mg/dL — ABNORMAL HIGH (ref 2.5–4.6)
Potassium: 4 mmol/L (ref 3.5–5.1)
Sodium: 128 mmol/L — ABNORMAL LOW (ref 135–145)

## 2020-11-28 LAB — CULTURE, BLOOD (ROUTINE X 2)
Culture: NO GROWTH
Culture: NO GROWTH

## 2020-11-28 LAB — GLUCOSE, RANDOM: Glucose, Bld: 408 mg/dL — ABNORMAL HIGH (ref 70–99)

## 2020-11-28 MED ORDER — INSULIN GLARGINE-YFGN 100 UNIT/ML ~~LOC~~ SOLN
18.0000 [IU] | Freq: Every day | SUBCUTANEOUS | Status: DC
  Administered 2020-11-29: 18 [IU] via SUBCUTANEOUS
  Filled 2020-11-28: qty 0.18

## 2020-11-28 MED ORDER — INSULIN ASPART 100 UNIT/ML IJ SOLN
0.0000 [IU] | Freq: Three times a day (TID) | INTRAMUSCULAR | Status: DC
  Administered 2020-11-28: 15 [IU] via SUBCUTANEOUS
  Administered 2020-11-28: 2 [IU] via SUBCUTANEOUS
  Administered 2020-11-29: 3 [IU] via SUBCUTANEOUS
  Administered 2020-11-29: 2 [IU] via SUBCUTANEOUS
  Administered 2020-11-30: 15 [IU] via SUBCUTANEOUS

## 2020-11-28 MED ORDER — INSULIN ASPART 100 UNIT/ML IJ SOLN
2.0000 [IU] | Freq: Three times a day (TID) | INTRAMUSCULAR | Status: DC
  Administered 2020-11-28 – 2020-11-29 (×5): 2 [IU] via SUBCUTANEOUS

## 2020-11-28 MED ORDER — INSULIN GLARGINE-YFGN 100 UNIT/ML ~~LOC~~ SOLN
14.0000 [IU] | Freq: Every day | SUBCUTANEOUS | Status: DC
  Administered 2020-11-28: 14 [IU] via SUBCUTANEOUS
  Filled 2020-11-28: qty 0.14

## 2020-11-28 MED ORDER — INSULIN GLARGINE-YFGN 100 UNIT/ML ~~LOC~~ SOLN
5.0000 [IU] | Freq: Once | SUBCUTANEOUS | Status: AC
  Administered 2020-11-28: 5 [IU] via SUBCUTANEOUS
  Filled 2020-11-28: qty 0.05

## 2020-11-28 MED ORDER — INSULIN ASPART 100 UNIT/ML IJ SOLN: 0.0000 [IU] | Freq: Every day | INTRAMUSCULAR | Status: DC

## 2020-11-28 NOTE — NC FL2 (Signed)
[place] LEVEL OF CARE SCREENING TOOL     IDENTIFICATION  Patient Name: [name] Birthdate: 1970/08/10 Sex: female Admission Date (Current Location): 11/21/2020  [facility] and [place] Number:  Herbalist and Address:  The . [facility], [place] [address], [place], [place] [address]      Provider Number: [id number]  Attending Physician Name and Address:  Bonnielee Haff, MD  Relative Name and Phone Number:  Wynetta Fines, Sister [phone]    Current Level of Care: Hospital Recommended Level of Care: [place] Prior Approval Number:    Date Approved/Denied:   PASRR Number: [id number]  Discharge Plan: SNF    Current Diagnoses: Patient Active Problem List   Diagnosis Date Noted   DKA, type 1 ([place]) 70/35/0093   Acute metabolic encephalopathy 81/82/9937   Hypertensive urgency 11/21/2020   DKA (diabetic ketoacidosis) ([place]) 11/21/2020   Precordial chest pain 04/27/2020   Allergy, unspecified, initial encounter 11/26/2019   Anaphylactic shock, unspecified, initial encounter 11/26/2019   Encounter for removal of sutures 11/24/2019   Chronic venous hypertension (idiopathic) with ulcer and inflammation of unspecified lower extremity ([place]) 09/08/2019   ESRD (end stage renal disease) on dialysis ([place])    Anemia in chronic kidney disease 08/04/2018   Coagulation defect, unspecified ([place]) 08/04/2018   Secondary hyperparathyroidism of renal origin ([place]) 08/04/2018   Osteomyelitis of right foot ([place]) 07/29/2018   Menorrhagia with irregular cycle 11/27/2017   Gangrene of left foot ([place]) 05/08/2017   Osteomyelitis of left foot ([place]) 05/08/2017   Diabetic foot infection ([place])    Diabetic foot ulcer ([place]) 05/05/2017   Cellulitis 05/04/2017   Diabetes mellitus type 1 with peripheral artery disease ([place]) 01/16/2017   Anxiety 03/23/2016   Compulsive skin picking 03/23/2016   Sepsis due to methicillin resistant  Staphylococcus aureus (MRSA) without acute organ dysfunction ([place]) 11/04/2015   Heart murmur 11/03/2015   Acute renal failure superimposed on stage 4 chronic kidney disease ([place]) 05/31/2015   PAD (peripheral artery disease) ([place]) 04/22/2015   Peripheral edema 12/28/2014   Protein-calorie malnutrition, moderate ([place]) 10/18/2014   History of DVT (deep vein thrombosis)    MRSA bacteremia    Diabetic infection of left foot ([place]) 10/13/2014   Chronic anemia 10/13/2014   Status post transmetatarsal amputation of right foot ([place]) 09/26/2014   Severe sepsis ([place]) 09/01/2014   Diabetes mellitus type 1, uncontrolled, insulin dependent ([place]) 11/09/2013   DM type 1 causing renal disease ([place]) 06/13/2013   Osteomyelitis ([place]) 06/12/2013   CKD (chronic kidney disease), stage III ([place]) 06/12/2013   Charcot foot due to diabetes mellitus ([place]) 05/09/2011   Foot ulcer due to secondary DM ([place]) 05/09/2011   Type 1 diabetes mellitus with neurological manifestations, uncontrolled (HCC)    Hypertension    Hypercholesteremia     Orientation RESPIRATION BLADDER Height & Weight     Self, Time, Situation, Place  Normal Continent Weight: 125 lb 7.1 oz (56.9 kg) Height:  5\' 7"  (170.2 cm)  BEHAVIORAL SYMPTOMS/MOOD NEUROLOGICAL BOWEL NUTRITION STATUS      Incontinent Diet (Please see DC Summary)  AMBULATORY STATUS COMMUNICATION OF NEEDS Skin   Limited Assist (uses manual wheelchair in home and electric wheelchair in community) Verbally Surgical wounds, Skin abrasions, Other (Comment) (diabetic ulcer left)                       Personal Care Assistance Level of Assistance  Bathing, Feeding, Dressing Bathing Assistance: Limited  assistance Feeding assistance: Independent (Able to feed self with adaptive devices) Dressing Assistance: Limited assistance     Functional Limitations Info  Sight, Hearing, Speech Sight Info: Impaired Hearing Info: Adequate Speech Info: Adequate    SPECIAL CARE FACTORS  FREQUENCY  PT (By licensed PT), OT (By licensed OT)     PT Frequency: 5x/Week OT Frequency: 5x/Week            Contractures Contractures Info:  (Withdraws to tactile stimuli, partial amputation right foot transmetatarsal)    Additional Factors Info  Code Status, Allergies, Insulin Sliding Scale Code Status Info: Full Allergies Info: Cleocin (Clindamycin Hcl), Lisinopril, Amoxicillin, Bactrim (Sulfamethoxazole-trimethoprim)   Insulin Sliding Scale Info: Please see Medication List       Current Medications (11/28/2020):  This is the current hospital active medication list Current Facility-Administered Medications  Medication Dose Route Frequency Provider Last Rate Last Admin   amLODipine (NORVASC) tablet 10 mg  10 mg Oral Daily Jonetta Osgood, MD   10 mg at 11/28/20 0258   aspirin EC tablet 81 mg  81 mg Oral Daily Bonnielee Haff, MD   81 mg at 11/28/20 0840   calcium acetate (PHOSLO) capsule 1,334 mg  1,334 mg Oral TID WC Penninger, Lindsay, PA   1,334 mg at 11/28/20 1310   Chlorhexidine Gluconate Cloth 2 % PADS 6 each  6 each Topical Q0600 Roney Jaffe, MD   6 each at 11/28/20 0546   Chlorhexidine Gluconate Cloth 2 % PADS 6 each  6 each Topical Q0600 Penninger, Ria Comment, [place]   6 each at 11/28/20 0545   clonazePAM (KLONOPIN) tablet 0.5 mg  0.5 mg Oral BID PRN Jonetta Osgood, MD   0.5 mg at 11/27/20 1755   collagenase (SANTYL) ointment   Topical Daily Jacqlyn Krauss, MD   Given at 11/28/20 0850   dextrose 50 % solution 0-50 mL  0-50 mL Intravenous PRN Reubin Milan, MD       doxercalciferol (HECTOROL) injection 9 mcg  9 mcg Intravenous Q T,Th,Sa-HD Penninger, Ria Comment, PA   9 mcg at 11/26/20 1555   haloperidol lactate (HALDOL) injection 2 mg  2 mg Intramuscular Q6H PRN Jonetta Osgood, MD   2 mg at 11/25/20 1055   hydrALAZINE (APRESOLINE) injection 10 mg  10 mg Intravenous Q4H PRN Jonetta Osgood, MD   10 mg at 11/28/20 1335   insulin aspart (novoLOG)  injection 0-15 Units  0-15 Units Subcutaneous TID WC Bonnielee Haff, MD   15 Units at 11/28/20 1514   insulin aspart (novoLOG) injection 0-5 Units  0-5 Units Subcutaneous QHS Bonnielee Haff, MD       insulin aspart (novoLOG) injection 2 Units  2 Units Subcutaneous TID WC Bonnielee Haff, MD   2 Units at 11/28/20 1312   [START ON 11/29/2020] insulin glargine-yfgn (SEMGLEE) injection 18 Units  18 Units Subcutaneous Daily Bonnielee Haff, MD       labetalol (NORMODYNE) injection 10 mg  10 mg Intravenous Q2H PRN Ghimire, Henreitta Leber, MD       melatonin tablet 3 mg  3 mg Oral QHS PRN Opyd, Ilene Qua, MD   3 mg at 11/27/20 2239   methocarbamol (ROBAXIN) tablet 500 mg  500 mg Oral Q6H PRN Jonetta Osgood, MD   500 mg at 11/26/20 1644   mupirocin ointment (BACTROBAN) 2 %   Nasal BID Jonetta Osgood, MD   Given at 11/28/20 0848   ondansetron (ZOFRAN) injection 4 mg  4 mg Intravenous  Q6H PRN Jacqlyn Krauss, MD   4 mg at 11/21/20 1805   vancomycin (VANCOREADY) IVPB 750 mg/150 mL  750 mg Intravenous Q T,Th,Sa-HD Mignon Pine, DO   Stopped at 11/26/20 1555     Discharge Medications: Please see discharge summary for a list of discharge medications.  Relevant Imaging Results:  Relevant Lab Results:   Additional Information SSN#: [id number] No COVID-19 vaccines  Yoshi Mancillas, LCSWA

## 2020-11-28 NOTE — Progress Notes (Signed)
PROGRESS NOTE        PATIENT DETAILS Name: [name] Age: 50 y.o. Sex: female Date of Birth: March 28, 1971 Admit Date: 11/21/2020 Admitting Physician Jacqlyn Krauss, MD [id number], Babs Bertin, PA-C  Brief Narrative: Patient is a 50 y.o. female with history of DM-1, diabetic retinopathy, chronic left foot osteomyelitis, ESRD on HD-recent history of MRSA bacteremia-presented with acute metabolic encephalopathy, DKA and left diabetic foot with osteomyelitis.  On 8/25-her encephalopathy finally improved-but was found to have bilateral visual loss with initial concern for seizure like activity on spot EEG-however no seizure noted on LTM EEG, but now felt to be due to BP/CBG fluctuation.  See below for further details.  Significant events: 7/14>> nausea/vomiting-left foot cellulitis-evaluated at urgent care in [place] on 7/14-blood cultures positive for MRSA.  Declined admission.  Subsequently received IV vancomycin with HD from 7/26-8/04 8/22>> presented to [facility] ED with confusion-found to have DKA 8/25>> finally more awake and alert-claims she cannot see out of both eyes.  Spot EEG w discharge from left hemisphere-EEG improved with Ativan 2 mg x 1  Significant studies: 8/22>> x-ray left foot: Osteomyelitis at the second phalange 8/22>> CXR: No pneumonia 8/22>> CT head: No acute intracranial abnormality 8/24>> CT maxillofacial area: No acute infection 8/25>> EEG: Generalized periodic discharges from left hemisphere-given 2 mg of IV Ativan-EEG improved. 8/25>> Echo: EF 60-65%-no obvious vegetations. 8/25>> MRI brain: No acute intracranial pathology 8/25-8/26>> LTM EEG: Cortical dysfunction in the left posterior quadrant-nonspecific etiology-no seizures or definite epileptiform discharges seen.  Antimicrobial therapy: Vancomycin: 8/22>> Cefepime: 8/22>>8/23  Microbiology data: 8/22>> COVID/influenza PCR: Negative 8/22>> blood culture: Negative so far 8/24>>  blood culture: Negative so far  Procedures : None  Consults: ID, nephrology, podiatry, neurology,opthalmology  DVT Prophylaxis : SCDs Start: 11/21/20 1037   Subjective: Patient mentions that her vision continues to improve.  She is able to see me this morning.  Denies any other complaints at this time.  Assessment/Plan:  Acute metabolic encephalopathy: Multifactorial etiology-DKA/BUN elevation in setting of ESRD.  She seems to be back to baseline.  Questionable seizures: Initial spot EEG was suspicious for possible seizures-however LTM EEG was negative for seizures.  Neurology recommending to hold off on starting antiepileptics.  Bilateral visual loss: No clear-cut etiology identified-no pathology evident on ocular exam by ophthalmologist.  Per my discussion with primary ophthalmologist on 8/25-patient has a longstanding history of diabetic retinopathy (20/70 on right and 20/50 on left)-history of laser procedures/and eye injections every 8 weeks per primary ophthalmologist). No lesion seen on MRI brain.   Discussed with Dr. Mayford Knife MD on 8/26-suspicion that she may have had small infarcts in her bilateral optic nerves in the setting of blood pressure/CBG fluctuations in a background of significant diabetic retinopathy at baseline.  Doppler was recommended which does not show any significant stenosis.   Vision seems to be improving.    Ophthalmology recommending more specialized exam in the outpatient setting after discharge (follows with Ophthalmologist Dr Baird [facility] in [facility]).  Sepsis due to acute osteomyelitis involving the left second phalanx-possibility of underlying MRSA infection: Sepsis physiology has improved.  Patient had positive blood cultures for MRSA in [place] on 7/14-it appears that she declined admission and apparently received treatment with IV vancomycin with hemodialysis from 7/26-8/4.  Evaluated by podiatry on 8/23-she appears to have more of a chronic osteomyelitis  rather than a acute  osteomyelitis. Blood cultures on 8/22 negative so far-transthoracic echo without any vegetations.  ID recommending 4 weeks of vancomycin with HD-end date of 12/19/2020. Seen by podiatry who did offer her surgical intervention however patient has declined.  Facial swelling: Etiology felt to be either due to volume overload-improved after fluid removal with HD on 8/24.  CT maxillofacial area does not show any acute infection.    DKA, history of DM 1 on insulin pump (A1c 8.1 on 8/3):  DKA resolved with IV insulin.  Patient was transitioned to basal insulin.  There was concern that patient may not be able to appropriately use her insulin pump with visual impairment.  Glucose levels have been quite elevated.  Dose of long-acting insulin was increased today.  Continue SSI.  Prior to this hospitalization-patient was on an insulin pump-with her having visual issues-she would likely need to stay on basal/bolus regimen.  However we may need to revisit since her vision seems to be improving.   Recent Labs    11/27/20 1659 11/27/20 2106 11/28/20 0844  GLUCAP 457* 298* 435*     Diarrhea: Questionable etiology-unclear if patient has bowel related autonomic neuropathy in the setting of diabetes.  C. difficile studies and GI pathogen panel negative.  Will use prn Imodium.  Appears to have resolved.  ESRD (TTS): Nephrology following.   Normocytic anemia: Due to ESRD-defer need for EPO/IV iron to nephrology.  Hyperlipidemia: Can resume statin at discharge.  Essential hypertension/hyponatremia: BP fluctuating-but overall better controlled-continue amlodipine.  Sodium levels noted to be low.  Continue to monitor.  History of right transmetatarsal amputation  Social issues: Patient apparently lives with roommates.  May need rehabilitation in a skilled nursing facility.  PT yesterday who did recommend SNF.  But this morning OT seems to think that patient has improved.  We will have PT  reevaluate.  If she indeed has improved then she could possibly go home with home health.  Diet: Diet Order             Diet renal/carb modified with fluid restriction Diet-HS Snack? Nothing; Fluid restriction: 1200 mL Fluid; Room service appropriate? Yes; Fluid consistency: Thin  Diet effective now                    Code Status: Full code   Family Communication: Sister-Alicia [phone]   Disposition Plan: Status is: Inpatient  Remains inpatient appropriate because:Inpatient level of care appropriate due to severity of illness  Dispo: The patient is from: Home              Anticipated d/c is to: Home              Patient currently is not medically stable to d/c.   Difficult to place patient No   Barriers to Discharge: Resolving encephalopathy-now with seizure disorder-blindness-work-up in progress.  See above.    Antimicrobial agents: Anti-infectives (From admission, onward)    Start     Dose/Rate Route Frequency Ordered Stop   11/23/20 1800  ceFEPIme (MAXIPIME) 1 g in sodium chloride 0.9 % 100 mL IVPB  Status:  Discontinued        1 g 200 mL/hr over 30 Minutes Intravenous Daily-1800 11/23/20 1126 11/23/20 1145   11/23/20 1030  vancomycin (VANCOREADY) IVPB 750 mg/150 mL        750 mg 150 mL/hr over 60 Minutes Intravenous To Hemodialysis 11/23/20 1021 11/23/20 1305   11/23/20 1030  ceFEPIme (MAXIPIME) 2 g in sodium chloride 0.9 %  100 mL IVPB  Status:  Discontinued        2 g 200 mL/hr over 30 Minutes Intravenous To Hemodialysis 11/23/20 1021 11/23/20 1125   11/22/20 1800  ceFEPIme (MAXIPIME) 2 g in sodium chloride 0.9 % 100 mL IVPB  Status:  Discontinued        2 g 200 mL/hr over 30 Minutes Intravenous Every T-Th-Sa (Hemodialysis) 11/21/20 2018 11/22/20 0816   11/22/20 1800  ceFEPIme (MAXIPIME) 2 g in sodium chloride 0.9 % 100 mL IVPB  Status:  Discontinued        2 g 200 mL/hr over 30 Minutes Intravenous Every T-Th-Sa (1800) 11/22/20 0816 11/23/20  1125   11/22/20 1500  vancomycin (VANCOREADY) IVPB 750 mg/150 mL        750 mg 150 mL/hr over 60 Minutes Intravenous Every T-Th-Sa (Hemodialysis) 11/22/20 1357 12/19/20 2359   11/22/20 1000  ceFEPIme (MAXIPIME) 2 g in sodium chloride 0.9 % 100 mL IVPB  Status:  Discontinued       Note to Pharmacy: Pharmacy to dose   2 g 200 mL/hr over 30 Minutes Intravenous Every 24 hours 11/21/20 1035 11/21/20 2017   11/22/20 0435  vancomycin variable dose per unstable renal function (pharmacist dosing)  Status:  Discontinued         Does not apply See admin instructions 11/22/20 0435 11/23/20 1022   11/21/20 0700  vancomycin (VANCOCIN) IVPB 1000 mg/200 mL premix        1,000 mg 200 mL/hr over 60 Minutes Intravenous  Once 11/21/20 0648 11/21/20 1247   11/21/20 0700  ceFEPIme (MAXIPIME) 2 g in sodium chloride 0.9 % 100 mL IVPB        2 g 200 mL/hr over 30 Minutes Intravenous  Once 11/21/20 0656 11/21/20 1022   11/21/20 0645  metroNIDAZOLE (FLAGYL) IVPB 500 mg        500 mg 100 mL/hr over 60 Minutes Intravenous  Once 11/21/20 0636 11/21/20 0816          MEDICATIONS: Scheduled Meds:  amLODipine  10 mg Oral Daily   aspirin EC  81 mg Oral Daily   calcium acetate  1,334 mg Oral TID WC   Chlorhexidine Gluconate Cloth  6 each Topical Q0600   Chlorhexidine Gluconate Cloth  6 each Topical Q0600   collagenase   Topical Daily   doxercalciferol  9 mcg Intravenous Q T,Th,Sa-HD   insulin aspart  0-6 Units Subcutaneous TID WC   insulin glargine-yfgn  14 Units Subcutaneous Daily   mupirocin ointment   Nasal BID   Continuous Infusions:  vancomycin Stopped (11/26/20 1555)   PRN Meds:.clonazePAM, dextrose, haloperidol lactate, hydrALAZINE, labetalol, melatonin, methocarbamol, ondansetron (ZOFRAN) IV   PHYSICAL EXAM: Vital signs: Vitals:   11/27/20 1701 11/27/20 2000 11/28/20 0415 11/28/20 0843  BP: 138/85 (!) 100/51 (!) 144/78 (!) 162/80  Pulse: 87 78 79 87  Resp: 20 20 16 18   Temp: 97.7 F (36.5 C)  97.9 F (36.6 C) 98.1 F (36.7 C) 97.9 F (36.6 C)  TempSrc: Oral Oral Oral Oral  SpO2: 100% 99% 97% 96%  Weight:      Height:       Filed Weights   11/23/20 1347 11/26/20 1320 11/26/20 1555  Weight: 57.6 kg 58.1 kg 56.9 kg   Body mass index is 19.65 kg/m.   General appearance: Awake alert.  In no distress Resp: Clear to auscultation bilaterally.  Normal effort Cardio: S1-S2 is normal regular.  No S3-S4.  No rubs murmurs or  bruit GI: Abdomen is soft.  Nontender nondistended.  Bowel sounds are present normal.  No masses organomegaly Extremities: No edema.  Full range of motion of lower extremities. Neurologic: Alert and oriented x3.  No focal neurological deficits.     I have personally reviewed following labs and imaging studies  LABORATORY DATA: CBC: Recent Labs  Lab 11/21/20 1227 11/22/20 0714 11/24/20 0200 11/25/20 0811 11/26/20 0116 11/27/20 0205 11/28/20 0150  WBC 7.8   < > 7.5 5.1 6.5 7.3 6.4  NEUTROABS 6.0  --   --   --   --   --   --   HGB 10.2*   < > 10.6* 11.1* 11.1* 10.8* 10.3*  HCT 30.6*   < > 32.8* 34.3* 33.7* 32.0* 31.4*  MCV 88.4   < > 91.9 91.0 90.1 88.6 89.0  PLT 226   < > 230 274 297 301 321   < > = values in this interval not displayed.     Basic Metabolic Panel: Recent Labs  Lab 11/24/20 0200 11/25/20 0811 11/26/20 0116 11/27/20 0205 11/28/20 0150  NA 136 137 133* 131* 128*  K 3.7 4.2 4.5 4.2 4.0  CL 99 100 95* 93* 91*  CO2 16* 20* 18* 23 22  GLUCOSE 160* 103* 230* 350* 277*  BUN 31* 39* 47* 38* 62*  CREATININE 6.35* 9.07* 10.24* 8.39* 10.14*  CALCIUM 8.4* 8.0* 8.0* 8.3* 8.2*  PHOS 4.8* 6.7* 7.2* 5.8* 5.8*     GFR: Estimated Creatinine Clearance: 6 mL/min (A) (by C-G formula based on SCr of 10.14 mg/dL (H)).  Liver Function Tests: Recent Labs  Lab 11/23/20 0735 11/24/20 0200 11/25/20 0811 11/26/20 0116 11/27/20 0205 11/28/20 0150  AST 26  --   --   --   --   --   ALT 32  --   --   --   --   --   ALKPHOS 156*  --    --   --   --   --   BILITOT 0.9  --   --   --   --   --   PROT 6.6  --   --   --   --   --   ALBUMIN 3.0*  3.1* 3.0* 2.8* 2.8* 2.8* 2.6*     Recent Labs  Lab 11/23/20 7408  AMMONIA 51*        Sepsis Labs: Lactic Acid, Venous    Component Value Date/Time   LATICACIDVEN 1.6 11/21/2020 1227    MICROBIOLOGY: Recent Results (from the past 240 hour(s))  Blood culture (routine single)     Status: None   Collection Time: 11/21/20  5:50 AM   Specimen: BLOOD  Result Value Ref Range Status   Specimen Description   Final    BLOOD SITE NOT SPECIFIED Performed at [facility] [address] [place], [place] [address]    Special Requests   Final    BOTTLES DRAWN AEROBIC AND ANAEROBIC Blood Culture adequate volume Performed at [facility] [address] [place], [place] [address]    Culture   Final    NO GROWTH 5 DAYS Performed at [facility], [place] [address] [place], [place] [address]    Report Status 11/26/2020 FINAL  Final  Resp Panel by RT-PCR (Flu A&B, Covid) Nasopharyngeal Swab     Status: None   Collection Time: 11/21/20  8:11 AM   Specimen: Nasopharyngeal Swab; Nasopharyngeal(NP) swabs in vial transport medium  Result Value Ref Range Status   SARS Coronavirus 2 by RT PCR NEGATIVE NEGATIVE Final    Comment: (NOTE) SARS-CoV-2 target nucleic acids are NOT DETECTED.  The SARS-CoV-2 RNA is generally detectable in upper respiratory specimens during the acute phase of infection. The lowest concentration of SARS-CoV-2 viral copies this assay can detect is 138 copies/mL. A negative result does not preclude SARS-Cov-2 infection and should not be used as the sole basis for treatment or other patient management decisions. A negative result may occur with  improper specimen collection/handling, submission of specimen other than nasopharyngeal swab, presence of viral mutation(s) within the areas targeted by this assay, and  inadequate number of viral copies(<138 copies/mL). A negative result must be combined with clinical observations, patient history, and epidemiological information. The expected result is Negative.  Fact Sheet for Patients:  EntrepreneurPulse.com.au  Fact Sheet for Healthcare Providers:  IncredibleEmployment.be  This test is no t yet approved or cleared by the [place] FDA and  has been authorized for detection and/or diagnosis of SARS-CoV-2 by FDA under an Emergency Use Authorization (EUA). This EUA will remain  in effect (meaning this test can be used) for the duration of the COVID-19 declaration under Section 564(b)(1) of the Act, 21 U.S.C.section 360bbb-3(b)(1), unless the authorization is terminated  or revoked sooner.       Influenza A by PCR NEGATIVE NEGATIVE Final   Influenza B by PCR NEGATIVE NEGATIVE Final    Comment: (NOTE) The Xpert Xpress SARS-CoV-2/FLU/RSV plus assay is intended as an aid in the diagnosis of influenza from Nasopharyngeal swab specimens and should not be used as a sole basis for treatment. Nasal washings and aspirates are unacceptable for Xpert Xpress SARS-CoV-2/FLU/RSV testing.  Fact Sheet for Patients: EntrepreneurPulse.com.au  Fact Sheet for Healthcare Providers: IncredibleEmployment.be  This test is not yet approved or cleared by the [place] FDA and has been authorized for detection and/or diagnosis of SARS-CoV-2 by FDA under an Emergency Use Authorization (EUA). This EUA will remain in effect (meaning this test can be used) for the duration of the COVID-19 declaration under Section 564(b)(1) of the Act, 21 U.S.C. section 360bbb-3(b)(1), unless the authorization is terminated or revoked.  Performed at [facility], [place] [address], [place], [place] [address]   Culture, blood (single)     Status: None   Collection Time: 11/21/20  3:45 PM    Specimen: BLOOD RIGHT HAND  Result Value Ref Range Status   Specimen Description BLOOD RIGHT HAND  Final   Special Requests IN PEDIATRIC BOTTLE Blood Culture adequate volume  Final   Culture   Final    NO GROWTH 5 DAYS Performed at [facility], [place] [address] [place], [place] [address]    Report Status 11/26/2020 FINAL  Final  MRSA Next Gen by PCR, Nasal     Status: Abnormal   Collection Time: 11/22/20 10:57 AM   Specimen: Nasal Mucosa; Nasal Swab  Result Value Ref Range Status   MRSA by PCR Next Gen DETECTED (A) NOT DETECTED Final    Comment: RESULT CALLED TO, READ BACK BY AND VERIFIED WITH: Maryan Puls RN 11/22/2020 @1417  BY JW (NOTE) The GeneXpert MRSA Assay (FDA approved for NASAL specimens only), is one component of a comprehensive MRSA colonization surveillance program. It is not intended to diagnose MRSA infection nor to guide or monitor treatment for MRSA infections. Test performance is not FDA approved in patients less than 72 years old. Performed at [facility],  [address], [place], [place] [address]   C Difficile Quick Screen w PCR reflex     Status: None   Collection Time: 11/22/20  4:33 PM   Specimen: STOOL  Result Value Ref Range Status   C Diff antigen NEGATIVE NEGATIVE Final   C Diff toxin NEGATIVE NEGATIVE Final   C Diff interpretation No C. difficile detected.  Final    Comment: Performed at [facility], [place] [address], [place], [place] [address]  Gastrointestinal Panel by PCR , Stool     Status: None   Collection Time: 11/22/20  4:33 PM   Specimen: STOOL  Result Value Ref Range Status   Campylobacter species NOT DETECTED NOT DETECTED Final   Plesimonas shigelloides NOT DETECTED NOT DETECTED Final   Salmonella species NOT DETECTED NOT DETECTED Final   Yersinia enterocolitica NOT DETECTED NOT DETECTED Final   Vibrio species NOT DETECTED NOT DETECTED Final   Vibrio cholerae NOT DETECTED NOT DETECTED Final   Enteroaggregative E  coli (EAEC) NOT DETECTED NOT DETECTED Final   Enteropathogenic E coli (EPEC) NOT DETECTED NOT DETECTED Final   Enterotoxigenic E coli (ETEC) NOT DETECTED NOT DETECTED Final   Shiga like toxin producing E coli (STEC) NOT DETECTED NOT DETECTED Final   Shigella/Enteroinvasive E coli (EIEC) NOT DETECTED NOT DETECTED Final   Cryptosporidium NOT DETECTED NOT DETECTED Final   Cyclospora cayetanensis NOT DETECTED NOT DETECTED Final   Entamoeba histolytica NOT DETECTED NOT DETECTED Final   Giardia lamblia NOT DETECTED NOT DETECTED Final   Adenovirus F40/41 NOT DETECTED NOT DETECTED Final   Astrovirus NOT DETECTED NOT DETECTED Final   Norovirus GI/GII NOT DETECTED NOT DETECTED Final   Rotavirus A NOT DETECTED NOT DETECTED Final   Sapovirus (I, II, IV, and V) NOT DETECTED NOT DETECTED Final    Comment: Performed at [facility], [address] [place], [place] [address]  Culture, blood (routine x 2)     Status: None   Collection Time: 11/23/20  3:26 PM   Specimen: BLOOD RIGHT HAND  Result Value Ref Range Status   Specimen Description BLOOD RIGHT HAND  Final   Special Requests   Final    BOTTLES DRAWN AEROBIC AND ANAEROBIC Blood Culture results may not be optimal due to an excessive volume of blood received in culture bottles   Culture   Final    NO GROWTH 5 DAYS Performed at [facility], [address], [place], [place] [address]    Report Status 11/28/2020 FINAL  Final  Culture, blood (routine x 2)     Status: None   Collection Time: 11/23/20  3:27 PM   Specimen: BLOOD RIGHT HAND  Result Value Ref Range Status   Specimen Description BLOOD RIGHT HAND  Final   Special Requests   Final    BOTTLES DRAWN AEROBIC AND ANAEROBIC Blood Culture results may not be optimal due to an excessive volume of blood received in culture bottles   Culture   Final    NO GROWTH 5 DAYS Performed at [facility], [place] [address], [place], [place] [address]    Report Status 11/28/2020 FINAL   Final    RADIOLOGY STUDIES/RESULTS: VAS US CAROTID  Result Date: 11/27/2020 Carotid Arterial Duplex Study Patient Name:  KIARAH ECKSTEIN  Date of Exam:   11/27/2020 Medical Rec #: [id number]           Accession #:    [id number] Date of Birth: [birth year]  Patient Gender: F Patient Age:   76 years Exam Location:  [facility] Procedure:      VAS US CAROTID Referring Phys: Lesleigh Noe --------------------------------------------------------------------------------  Indications:      Visual disturbance. Risk Factors:     Hypertension, hyperlipidemia, Diabetes, past history of                   smoking. Comparison Study: No prior studies. Performing Technologist: Darlin Coco RDMS, RVT  Examination Guidelines: A complete evaluation includes B-mode imaging, spectral Doppler, color Doppler, and power Doppler as needed of all accessible portions of each vessel. Bilateral testing is considered an integral part of a complete examination. Limited examinations for reoccurring indications may be performed as noted.  Right Carotid Findings: +----------+--------+--------+--------+------------------+--------+           PSV cm/sEDV cm/sStenosisPlaque DescriptionComments +----------+--------+--------+--------+------------------+--------+ CCA Prox  77      11                                         +----------+--------+--------+--------+------------------+--------+ CCA Distal72      18                                         +----------+--------+--------+--------+------------------+--------+ ICA Prox  47      14      1-39%   heterogenous               +----------+--------+--------+--------+------------------+--------+ ICA Distal65      21                                         +----------+--------+--------+--------+------------------+--------+ ECA       83      11                                          +----------+--------+--------+--------+------------------+--------+ +----------+--------+-------+----------------+-------------------+           PSV cm/sEDV cmsDescribe        Arm Pressure (mmHG) +----------+--------+-------+----------------+-------------------+ [id number]             Multiphasic, WNL                    +----------+--------+-------+----------------+-------------------+ +---------+--------+--+--------+--+---------+ VertebralPSV cm/s40EDV cm/s12Antegrade +---------+--------+--+--------+--+---------+  Left Carotid Findings: +----------+--------+--------+--------+-------------------------+--------+           PSV cm/sEDV cm/sStenosisPlaque Description       Comments +----------+--------+--------+--------+-------------------------+--------+ CCA Prox  88      14                                                +----------+--------+--------+--------+-------------------------+--------+ CCA Distal63      14                                                +----------+--------+--------+--------+-------------------------+--------+ ICA Prox  89  27      1-39%   heterogenous and calcific         +----------+--------+--------+--------+-------------------------+--------+ ICA Distal104     32                                                +----------+--------+--------+--------+-------------------------+--------+ ECA       91      9                                                 +----------+--------+--------+--------+-------------------------+--------+ +----------+--------+--------+----------------+-------------------+           PSV cm/sEDV cm/sDescribe        Arm Pressure (mmHG) +----------+--------+--------+----------------+-------------------+ [id number]             Multiphasic, WNL                    +----------+--------+--------+----------------+-------------------+ +---------+--------+--+--------+--+---------+ VertebralPSV  cm/s50EDV cm/s13Antegrade +---------+--------+--+--------+--+---------+   Summary: Right Carotid: Velocities in the right ICA are consistent with a 1-39% stenosis. Left Carotid: Velocities in the left ICA are consistent with a 1-39% stenosis. Vertebrals:  Bilateral vertebral arteries demonstrate antegrade flow. Subclavians: Normal flow hemodynamics were seen in bilateral subclavian              arteries. *See table(s) above for measurements and observations.  Electronically signed by Monica Martinez MD on 11/27/2020 at 2:26:14 PM.    Final      LOS: 7 days   Bonnielee Haff, MD  [facility]    To contact the attending provider between 7A-7P or the covering provider during after hours 7P-7A, please log into the web site [email or web address] and access using universal [place] password for that web site. If you do not have the password, please call the hospital operator.  11/28/2020, 11:02 AM

## 2020-11-28 NOTE — Progress Notes (Signed)
Vision and orientation have both improved. Pt able to see when sitting up or when laying on side, still loses vision when laying down.  Orientation/confusion has also improved to only intermittent confusion. Less bizarre behavior.  Pt assisted to stand with 1 person assist and walk a few steps to sit in  chair to bath and have bed changed. Cautioned pt about falls prevention.  Blood sugars continue to run elevated requiring SSI coverage. There is NO night time/ HS SSI ordered only mealtime insulin orders. I have needed to call triad hospitalist night call for the past 2 nights to get additional insulin orders.

## 2020-11-28 NOTE — Progress Notes (Signed)
Inpatient Diabetes Program Recommendations  AACE/ADA: New Consensus Statement on Inpatient Glycemic Control (2015)  Target Ranges:  Prepandial:   less than 140 mg/dL      Peak postprandial:   less than 180 mg/dL (1-2 hours)      Critically ill patients:  140 - 180 mg/dL   Lab Results  Component Value Date   GLUCAP 435 (H) 11/28/2020   HGBA1C 8.1 (A) 11/02/2020    Review of Glycemic Control Results for [name], [name]" (MRN [id number]) as of 11/28/2020 10:17  Ref. Range 11/27/2020 07:56 11/27/2020 12:18 11/27/2020 16:59 11/27/2020 21:06 11/28/2020 08:44  Glucose-Capillary Latest Ref Range: 70 - 99 mg/dL 263 (H) 129 (H) 457 (H) 298 (H) 435 (H)   Diabetes history: DM1 Outpatient Diabetes medications: Insulin Pump Current orders for Inpatient glycemic control: Semglee 14 units + Novolog 0-6 units tid with meals  Inpatient Diabetes Program Recommendations:   Agree with increase in Semglee to 14 units daily. -Add Novolog 2 units tid meal coverage if eats 50% -Add Novolog 0-5 units hs correction Secure chat sent to Dr. [place] Pink.  Per Dr. Ronnie Derby office note on 02/17/20 INSULIN PUMP regimen:    Current insulin pump: T-insulin as of 07/20/2019   Basal rates: 0.3 until 3 PM and then 0.35  all 24 hours Previous settings: Midnight to 6 AM = 0.6, 3 AM = 0.5, 6 AM-9 AM: 0.55, 9 AM-1 PM = 0. 3, 1 PM-3 PM = 0.3, 3 PM- 7 PM = 0.75, 7 PM-12 AM = 0.85    Carb coverage 1: 20 g and correction 1: 70 Active insulin 4 hours CGM alerts: 70, 300 Total basal = 13.5 units

## 2020-11-28 NOTE — Evaluation (Signed)
Occupational Therapy Evaluation Patient Details Name: [name] MRN: [id number] DOB: [birth year] Today's Date: 11/28/2020    History of Present Illness 50 y.o. female presented 11/21/20  with acute metabolic encephalopathy, DKA left diabetic foot with osteomyelitis. On 8/25-her encephalopathy finally improved-but was found to have bilateral visual loss /26-suspicion that she may have had small infarcts in her bilateral optic nerves in the setting of blood pressure/CBG fluctuations in a background of significant diabetic retinopathy at baseline. Refusing left second toe amputation. PMH: DM-1, diabetic retinopathy, chronic left foot osteomyelitis, ESRD on HD-recent history of MRSA bacteremia- and left diabetic foot with osteomyelitis.   Clinical Impression   Patient evaluated by Occupational Therapy with no further acute OT needs identified. All education has been completed and the patient has no further questions. Pt reports feeling back to baseline with vision and her ability to perform ADLs at wheelchair level. Unfortunately no WC  available in room, however pt able to demonstrate BADLs at EOB and in recliner with just setup needed due to lack of WC. See below for any follow-up Occupational Therapy or equipment needs. OT is signing off. Thank you for this referral.     Follow Up Recommendations  No OT follow up    Equipment Recommendations  None recommended by OT    Recommendations for Other Services       Precautions / Restrictions Precautions Precautions: Fall Restrictions Weight Bearing Restrictions: No      Mobility Bed Mobility Overal bed mobility: Modified Independent                  Transfers Overall transfer level: Needs assistance   Transfers: Stand Pivot Transfers   Stand pivot transfers: Supervision       General transfer comment: supervision for safety to recliner.    Balance Overall balance assessment: History of Falls (Pt reports fall just  prior to admission) Sitting-balance support: Feet unsupported;No upper extremity supported Sitting balance-Leahy Scale: Normal         Standing balance comment: only pivots to wheelchair                           ADL either performed or assessed with clinical judgement   ADL Overall ADL's : At baseline                                       General ADL Comments: No wheelchair in room, but pt able to demostrate basic ADLs at EOB and in recliner. Pt pivoted to recliner performing 180 degree turn for line mangagement with supervision only. Pt reports that she feels back to baseline in terms of ADL performance.     Vision Baseline Vision/History: 5 Retinopathy;1 Wears glasses Ability to See in Adequate Light: 1 Impaired Patient Visual Report: Blurring of vision Additional Comments: Pt reports that her vision is much improved and back to baseline. At baseline vision is impaired due to severe diabetic retinopathy. Pt able to read OT's badge correctly. Pt struggled with distance vision but able to make out some words on wall-sign and could read dry erase board correctly without glasses. Noted pt's glasses in room but pt not wearing.     Perception     Praxis      Pertinent Vitals/Pain Pain Assessment: No/denies pain     Hand Dominance Right   Extremity/Trunk Assessment Upper Extremity Assessment  Upper Extremity Assessment: Overall WFL for tasks assessed   Lower Extremity Assessment Lower Extremity Assessment: Defer to PT evaluation RLE Deficits / Details: 5th and hallux amputation, hip knee and ankle ROM WFL, strength grossly 4/5 RLE Sensation: history of peripheral neuropathy   Cervical / Trunk Assessment Cervical / Trunk Assessment: Normal   Communication Communication Communication: No difficulties   Cognition Arousal/Alertness: Awake/alert Behavior During Therapy: WFL for tasks assessed/performed Overall Cognitive Status: No family/caregiver  present to determine baseline cognitive functioning Area of Impairment: Safety/judgement                         Safety/Judgement: Decreased awareness of safety     General Comments: Pressured speech with some inconsistencies to history.   General Comments  Standing NT due to pt report that she has been advised not to stand of walk. No orders in chart.    Exercises     Shoulder Instructions      Home Living Family/patient expects to be discharged to:: Private residence Living Arrangements: Other (Comment) (Condo) Available Help at Discharge: Friend(s);Available 24 hours/day Type of Home: House Home Access: Ramped entrance     Home Layout: Two level;Able to live on main level with bedroom/bathroom Alternate Level Stairs-Number of Steps: 12 Alternate Level Stairs-Rails: Right Bathroom Shower/Tub: Occupational psychologist: Standard     Home Equipment: Wheelchair - manual;Wheelchair - power;Hand held shower head;Grab bars - tub/shower;Grab bars - toilet;Walker - 2 wheels;Cane - single point   Additional Comments: shower stool      Prior Functioning/Environment Level of Independence: Needs assistance  Gait / Transfers Assistance Needed: uses manual wheelchair in home and electric wheelchair in community. Pt reports "I can walk, but I'm not supposed to because I'll break every bone in my foot and not even realize it." Pt reports "Charcot foot" but not mentioned in pt's PMH. ADL's / Homemaking Assistance Needed: independent in BADLs and IADLs, uses [employer] for transportation. Pt reports performing cooking/cleaning/laundry at [facility] level as able, but performs limited ambulation for laundry. Pt reports that she does not have much of a relationship with her roommates and receives no assistance from them.   Comments: Was very active and used to run 2 miles/day. Lost her son at age of 35 to suicide.        OT Problem List: Decreased safety awareness      OT  Treatment/Interventions:      OT Goals(Current goals can be found in the care plan section) Acute Rehab OT Goals Patient Stated Goal: Increased assistance at home for chores. OT Goal Formulation: All assessment and education complete, DC therapy  OT Frequency:     Barriers to D/C:            Co-evaluation              AM-PAC OT "6 Clicks" Daily Activity     Outcome Measure Help from another person eating meals?: None Help from another person taking care of personal grooming?: A Little Help from another person toileting, which includes using toliet, bedpan, or urinal?: A Little Help from another person bathing (including washing, rinsing, drying)?: A Little Help from another person to put on and taking off regular upper body clothing?: A Little Help from another person to put on and taking off regular lower body clothing?: A Little 6 Click Score: 19   End of Session Nurse Communication: Other (comment) (RN cleared OT to see pt.)  Activity Tolerance: Patient tolerated treatment well Patient left: in chair;with call bell/phone within reach;with chair alarm set  OT Visit Diagnosis: History of falling (Z91.81);Other abnormalities of gait and mobility (R26.89);Low vision, both eyes (H54.2)                Time: 7207-2182 OT Time Calculation (min): 38 min Charges:  OT General Charges $OT Visit: 1 Visit OT Evaluation $OT Eval Moderate Complexity: 1 Mod  Sheniece Ruggles, OT Acute Rehab Services Office: [phone] 11/28/2020  Julien Girt 11/28/2020, 10:28 AM

## 2020-11-28 NOTE — TOC Initial Note (Signed)
Transition of Care [facility]) - Initial/Assessment Note    Patient Details  Name: [name] MRN: [id number] Date of Birth: 1970-10-03  Transition of Care [facility]) CM/SW Contact:    [place], [place] Phone Number: 11/28/2020, 4:32 PM  Clinical Narrative:                 CSW received consult for possible SNF placement at time of discharge. CSW spoke with patient. Patient reported that patient's roommate is currently unable to care for patient at their home given patient's current physical needs and fall risk. Patient expressed understanding of PT recommendation and is agreeable to SNF placement at time of discharge. Patient reports preference for [employer] only and if not then to go home with home health. CSW discussed insurance authorization process and will provide Medicare SNF ratings list. Patient has not received the COVID vaccines. Patient expressed being hopeful for rehab and to feel better soon. No further questions reported at this time.   Skilled Nursing Rehab Facilities-   [email or web address] Ratings out of 5 possible    Name Address  Phone # [place] Inspection Overall  [facility] [address], [place] 5 1 4 4   [facility]  [address], [place] [phone] 3 1 5 4   [facility] [place], [place] 3 1 1 1   [facility] [place], [place] 2 2 4 4   [facility] [address] [place] 3 1 2 1   [facility] [address], [place] 3 2 4 4   [facility] [address], [place] 5 1 2 2   [facility] [address], [address] [phone] 5 2 2 3   [facility] [place], [address] [place] [phone] 5 1 2 2   [facility] [phone] [address], 3235 [phone] 5 1 2 2   [facility] [address] [facility]  [phone] 5 1 2 2   [facility] [address] [place] 3 1 1 1      Expected Discharge Plan: [place] Barriers to Discharge: Continued Medical Work up   Patient Goals and CMS Choice Patient states their goals for this hospitalization and ongoing recovery are:: to go to [phone] only to D/C home CMS Medicare.gov Compare Post Acute Care list provided to:: Patient Choice offered to / list presented to : Patient  Expected Discharge Plan and Services Expected Discharge Plan: [place] In-house Referral: Clinical Social Work Discharge Planning Services: CM Consult   Living arrangements for the past 2 months: Apartment                                      Prior Living Arrangements/Services Living arrangements for the past 2 months: Apartment Lives with:: Self, Roommate Patient language and need for interpreter reviewed:: Yes        Need for Family Participation in Patient Care: No (Comment) Care giver support system in place?: No (comment)   Criminal Activity/Legal Involvement Pertinent to Current Situation/Hospitalization: No - Comment as needed  Activities of Daily Living Home Assistive Devices/Equipment: CBG Meter, Insulin Pump ADL Screening (condition at time of admission) Patient's cognitive ability adequate to safely complete daily activities?: Yes Is the patient deaf or have difficulty hearing?: No Does the patient have difficulty seeing, even when wearing glasses/contacts?: No Does  the patient have difficulty concentrating, remembering, or making decisions?: No Patient able to express need for assistance with ADLs?: Yes Does the patient have difficulty dressing or bathing?: No Independently performs ADLs?: Yes (appropriate for developmental age) Does the patient have difficulty walking or climbing stairs?: No Weakness of Legs: Both Weakness of Arms/Hands: None  Permission  Sought/Granted Permission sought to share information with : Case Manager, Customer service manager Permission granted to share information with : Yes, Verbal Permission Granted     Permission granted to share info w AGENCY: SNF's        Emotional Assessment Appearance:: Appears stated age Attitude/Demeanor/Rapport: Engaged Affect (typically observed): Pleasant Orientation: : Oriented to Self, Oriented to Place, Oriented to  Time, Oriented to Situation   Psych Involvement: No (comment)  Admission diagnosis:  DKA (diabetic ketoacidosis) ([place]) [E11.10] ESRD (end stage renal disease) on dialysis ([place]) [N18.6, Z99.2] Altered mental status, unspecified altered mental status type [R41.82] Diabetic ketoacidosis with coma associated with type 1 diabetes mellitus ([place]) [E10.11] Sepsis, due to unspecified organism, unspecified whether acute organ dysfunction present [facility]) [A41.9] Patient Active Problem List   Diagnosis Date Noted   DKA, type 1 ([place]) 24/12/7351   Acute metabolic encephalopathy 29/92/4268   Hypertensive urgency 11/21/2020   DKA (diabetic ketoacidosis) ([place]) 11/21/2020   Precordial chest pain 04/27/2020   Allergy, unspecified, initial encounter 11/26/2019   Anaphylactic shock, unspecified, initial encounter 11/26/2019   Encounter for removal of sutures 11/24/2019   Chronic venous hypertension (idiopathic) with ulcer and inflammation of unspecified lower extremity ([place]) 09/08/2019   ESRD (end stage renal disease) on dialysis ([place])    Anemia in chronic kidney disease 08/04/2018   Coagulation defect, unspecified ([place]) 08/04/2018   Secondary hyperparathyroidism of renal origin ([place]) 08/04/2018   Osteomyelitis of right foot ([place]) 07/29/2018   Menorrhagia with irregular cycle 11/27/2017   Gangrene of left foot ([place]) 05/08/2017   Osteomyelitis of left foot ([place]) 05/08/2017   Diabetic foot infection ([place])    Diabetic foot ulcer ([place]) 05/05/2017   Cellulitis 05/04/2017    Diabetes mellitus type 1 with peripheral artery disease ([place]) 01/16/2017   Anxiety 03/23/2016   Compulsive skin picking 03/23/2016   Sepsis due to methicillin resistant Staphylococcus aureus (MRSA) without acute organ dysfunction ([place]) 11/04/2015   Heart murmur 11/03/2015   Acute renal failure superimposed on stage 4 chronic kidney disease ([place]) 05/31/2015   PAD (peripheral artery disease) ([place]) 04/22/2015   Peripheral edema 12/28/2014   Protein-calorie malnutrition, moderate ([place]) 10/18/2014   History of DVT (deep vein thrombosis)    MRSA bacteremia    Diabetic infection of left foot ([place]) 10/13/2014   Chronic anemia 10/13/2014   Status post transmetatarsal amputation of right foot ([place]) 09/26/2014   Severe sepsis ([place]) 09/01/2014   Diabetes mellitus type 1, uncontrolled, insulin dependent ([place]) 11/09/2013   DM type 1 causing renal disease ([place]) 06/13/2013   Osteomyelitis ([place]) 06/12/2013   CKD (chronic kidney disease), stage III ([place]) 06/12/2013   Charcot foot due to diabetes mellitus ([place]) 05/09/2011   Foot ulcer due to secondary DM ([place]) 05/09/2011   Type 1 diabetes mellitus with neurological manifestations, uncontrolled ([place])    Hypertension    Hypercholesteremia    PCP:  Loyola Mast, PA-C Pharmacy:   [id number] [place], [place] [address] [place] [address] Phone: [phone] Fax: [phone]  [facility] [address] [place] [address] Phone: [phone] Fax: [phone]  [place], [place] [place] [place] [address] Phone: [phone] Fax: [phone]  [facility] [place] (New Address) - [place], [place] AT Previously: [place], [place] [address] [place] [address] Phone: [phone] Fax:  [phone]     Social Determinants of Health ([place]) Interventions    Readmission Risk Interventions Readmission Risk Prevention Plan 08/09/2018  Transportation Screening Complete  PCP or Specialist Appt within 5-7 Days Complete  Home Care Screening Complete  Medication Review (RN CM) Complete  Some recent data might be hidden

## 2020-11-28 NOTE — Progress Notes (Signed)
Notified md: poc glucose is 435, patient had already eaten breakfast, provided 6 units novolog and the long acting 14 units. Awaiting md response.

## 2020-11-28 NOTE — Progress Notes (Signed)
Subjective:  sitting up in bed side chair.looking at phone  ,no sob .  Objective Vital signs in last 24 hours: Vitals:   11/27/20 1701 11/27/20 2000 11/28/20 0415 11/28/20 0843  BP: 138/85 (!) 100/51 (!) 144/78 (!) 162/80  Pulse: 87 78 79 87  Resp: 20 20 16 18   Temp: 97.7 F (36.5 C) 97.9 F (36.6 C) 98.1 F (36.7 C) 97.9 F (36.6 C)  TempSrc: Oral Oral Oral Oral  SpO2: 100% 99% 97% 96%  Weight:      Height:       Weight change:  Physical Alert/ NAD chronically ill appearing Fem. in NAD, OX3 Heart:RRR, no mrg Lungs:CTAB, nml WOB on RA Abdomen:soft, NTND Extremities:no LE edema, L foot dressed Dialysis Access: LU AVF +bruit   OP Dialysis Orders: AF TTS   4h  56kg  2/2 bath Hep none LUA AVF   - hect 9 ug IV tiw     Problem/Plan DKA / type 1 DM -  stabilizing plans per admit. Please do not use LR or NS fluids without renal service approval.  OK to use straight D5W or D10W for BS support as needed.  Hyponatremia/ hyperkalemia - Na 128  K 4.20 today.  improved  with Glucose control  ESRD - on HD TTS.  Off schedule last week due to staffing issues and increased patient volume. HD 8/27  per regular schedule.  Next HD on 8/30. Tues   BP/ volume - BP's variable,  Amlodipine started, on labetalol and hydralazine IV prn. Net UF 1210 8/27  Does not appear grossly volume overloaded.  Visual loss- seen by ophthalmology/neuro, thought to be due to glucose/BP fluctuations.  Plan for further evaluation as OP. AMS - resolved,OX 3  this am .  thought to be due to metabolic dearrangements L foot osteomyelitis -Plan for 4 weeks vancomycin IV with HD with end date 12/19/20.  ID signed off.  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  Phos elevated. Restart binders. Continue VDRA  Ernest Haber, PA-C [facility] Beeper [phone] 11/28/2020,11:28 AM  LOS: 7 days   Labs: Basic Metabolic  Panel: Recent Labs  Lab 11/26/20 0116 11/27/20 0205 11/28/20 0150  NA 133* 131* 128*  K 4.5 4.2 4.0  CL 95* 93* 91*  CO2 18* 23 22  GLUCOSE 230* 350* 277*  BUN 47* 38* 62*  CREATININE 10.24* 8.39* 10.14*  CALCIUM 8.0* 8.3* 8.2*  PHOS 7.2* 5.8* 5.8*   Liver Function Tests: Recent Labs  Lab 11/23/20 0735 11/24/20 0200 11/26/20 0116 11/27/20 0205 11/28/20 0150  AST 26  --   --   --   --   ALT 32  --   --   --   --   ALKPHOS 156*  --   --   --   --   BILITOT 0.9  --   --   --   --   PROT 6.6  --   --   --   --   ALBUMIN 3.0*  3.1*   < > 2.8* 2.8* 2.6*   < > = values in this interval not displayed.   No results for input(s): LIPASE, AMYLASE in the last 168 hours. Recent Labs  Lab 11/23/20 0852  AMMONIA 51*   CBC: Recent Labs  Lab 11/21/20 1227 11/22/20 0814 11/24/20 0200 11/25/20 4818 11/26/20 0116 11/27/20 0205 11/28/20 0150  WBC 7.8   < > 7.5 5.1 6.5 7.3 6.4  NEUTROABS 6.0  --   --   --   --   --   --   HGB 10.2*   < > 10.6* 11.1* 11.1* 10.8* 10.3*  HCT 30.6*   < > 32.8* 34.3* 33.7* 32.0* 31.4*  MCV 88.4   < > 91.9 91.0 90.1 88.6 89.0  PLT 226   < > 230 274 297 301 321   < > = values in this interval not displayed.   Cardiac Enzymes: No results for input(s): CKTOTAL, CKMB, CKMBINDEX, TROPONINI in the last 168 hours. CBG: Recent Labs  Lab 11/27/20 0756 11/27/20 1218 11/27/20 1659 11/27/20 2106 11/28/20 0844  GLUCAP 263* 129* 457* 298* 435*   Medications:  vancomycin Stopped (11/26/20 1555)    amLODipine  10 mg Oral Daily   aspirin EC  81 mg Oral Daily   calcium acetate  1,334 mg Oral TID WC   Chlorhexidine Gluconate Cloth  6 each Topical Q0600   Chlorhexidine Gluconate Cloth  6 each Topical Q0600   collagenase   Topical Daily   doxercalciferol  9 mcg Intravenous Q T,Th,Sa-HD   insulin aspart  0-15 Units Subcutaneous TID WC   insulin aspart  0-5 Units Subcutaneous QHS   insulin aspart  2 Units Subcutaneous TID WC   insulin glargine-yfgn  14  Units Subcutaneous Daily   mupirocin ointment   Nasal BID

## 2020-11-29 LAB — GLUCOSE, CAPILLARY
Glucose-Capillary: 171 mg/dL — ABNORMAL HIGH (ref 70–99)
Glucose-Capillary: 116 mg/dL — ABNORMAL HIGH (ref 70–99)
Glucose-Capillary: 143 mg/dL — ABNORMAL HIGH (ref 70–99)
Glucose-Capillary: 197 mg/dL — ABNORMAL HIGH (ref 70–99)

## 2020-11-29 LAB — CBC
HCT: 29.4 % — ABNORMAL LOW (ref 36.0–46.0)
Hemoglobin: 9.8 g/dL — ABNORMAL LOW (ref 12.0–15.0)
MCH: 29.7 pg (ref 26.0–34.0)
MCHC: 33.3 g/dL (ref 30.0–36.0)
MCV: 89.1 fL (ref 80.0–100.0)
Platelets: 340 10*3/uL (ref 150–400)
RBC: 3.3 MIL/uL — ABNORMAL LOW (ref 3.87–5.11)
RDW: 13.9 % (ref 11.5–15.5)
WBC: 8 10*3/uL (ref 4.0–10.5)
nRBC: 0 % (ref 0.0–0.2)

## 2020-11-29 LAB — RENAL FUNCTION PANEL
Albumin: 2.8 g/dL — ABNORMAL LOW (ref 3.5–5.0)
Anion gap: 14 (ref 5–15)
BUN: 84 mg/dL — ABNORMAL HIGH (ref 6–20)
CO2: 22 mmol/L (ref 22–32)
Calcium: 8.6 mg/dL — ABNORMAL LOW (ref 8.9–10.3)
Chloride: 93 mmol/L — ABNORMAL LOW (ref 98–111)
Creatinine, Ser: 10.52 mg/dL — ABNORMAL HIGH (ref 0.44–1.00)
GFR, Estimated: 4 mL/min — ABNORMAL LOW (ref >60)
Glucose, Bld: 165 mg/dL — ABNORMAL HIGH (ref 70–99)
Phosphorus: 4.6 mg/dL (ref 2.5–4.6)
Potassium: 5.1 mmol/L (ref 3.5–5.1)
Sodium: 129 mmol/L — ABNORMAL LOW (ref 135–145)

## 2020-11-29 MED ORDER — DIPHENHYDRAMINE HCL 25 MG PO CAPS
25.0000 mg | ORAL_CAPSULE | Freq: Three times a day (TID) | ORAL | Status: DC | PRN
  Administered 2020-11-29 – 2020-11-30 (×3): 25 mg via ORAL
  Filled 2020-11-29 (×3): qty 1

## 2020-11-29 MED ORDER — INSULIN GLARGINE-YFGN 100 UNIT/ML ~~LOC~~ SOLN
12.0000 [IU] | Freq: Every day | SUBCUTANEOUS | Status: DC
  Filled 2020-11-29: qty 0.12

## 2020-11-29 MED ORDER — LORATADINE 10 MG PO TABS
10.0000 mg | ORAL_TABLET | Freq: Every day | ORAL | Status: DC
  Administered 2020-11-29 – 2020-12-01 (×3): 10 mg via ORAL
  Filled 2020-11-29 (×3): qty 1

## 2020-11-29 MED ORDER — DARBEPOETIN ALFA 25 MCG/0.42ML IJ SOSY
25.0000 ug | PREFILLED_SYRINGE | INTRAMUSCULAR | Status: DC
  Administered 2020-11-29: 25 ug via INTRAVENOUS
  Filled 2020-11-29: qty 0.42

## 2020-11-29 MED ORDER — LORATADINE 10 MG PO TABS
10.0000 mg | ORAL_TABLET | Freq: Once | ORAL | Status: AC
  Administered 2020-11-29: 10 mg via ORAL
  Filled 2020-11-29: qty 1

## 2020-11-29 NOTE — Progress Notes (Signed)
Pt had hypoglycemia yesterday evening. Pt found to be sweaty.  Had called to use BSC to urinate (has Oliguria). Pt became weak with slowed speech while being assisted off BSC and back in bed , FSBS done to =36 at 1950. Pt drowsy. T21 given per policy 1/2 amp. Very shortly after pt was more alert- able to  eat/ Given HS snack ( tuna portion, pineapple, and 2 packs graham crackers wit 2 portions peanut butter.      Sugar recheck = 78  remainder of Dextrose vail syringe given. Sugar recheck  1.5 hours later = 68. Pt given Lean Cuisine fish and pasta dinner. Recheck sugar 2 hours later =152.    Pt was only symptomatic briefly with the 1st blood sugar of 36. Provider on-call was notified and no additional insulin was given in the night shift.    Prior to this shift ,pt had been running very high blood sugars and was adjusted per communications and orders with a larger dosing of insulin 15 units at 1514 with 2 additional at 1725.

## 2020-11-29 NOTE — TOC Progression Note (Signed)
Transition of Care [facility]) - Progression Note    Patient Details  Name: [name] MRN: [id number] Date of Birth: 1970/07/31  Transition of Care [facility]) CM/SW [place], LCSW Phone Number: 11/29/2020, 4:25 PM  Clinical Narrative:    [facility] unable to accept patient. Patient out of the room when CSW went by to see if would consider a different SNF.    Expected Discharge Plan: Skilled Nursing Facility Barriers to Discharge: Continued Medical Work up  Expected Discharge Plan and Services Expected Discharge Plan: [place] In-house Referral: Clinical Social Work Discharge Planning Services: CM Consult   Living arrangements for the past 2 months: Apartment                                       Social Determinants of Health (SDOH) Interventions    Readmission Risk Interventions Readmission Risk Prevention Plan 08/09/2018  Transportation Screening Complete  PCP or Specialist Appt within 5-7 Days Complete  Home Care Screening Complete  Medication Review (RN CM) Complete  Some recent data might be hidden

## 2020-11-29 NOTE — Progress Notes (Addendum)
Subjective: Woken from sleep /no complaints for dialysis today, states vision is back to baseline  Objective Vital signs in last 24 hours: Vitals:   11/28/20 1648 11/29/20 0000 11/29/20 0738 11/29/20 0813  BP: 121/85 (!) 168/80 (!) 174/77 (!) 163/69  Pulse: 87 86 85 79  Resp: 18 18 18 18   Temp: 97.6 F (36.4 C) 97.6 F (36.4 C) (!) 97.5 F (36.4 C) (!) 97.5 F (36.4 C)  TempSrc: Oral Oral Oral Axillary  SpO2: 100% 99% 100% 100%  Weight:      Height:       Weight change:   Physical Alert/ NAD chronically ill appearing Fem. in NAD, OX3 Heart:RRR, no mrg Lungs:CTAB, nml WOB on RA Abdomen:soft, NTND Extremities:no LE edema, L foot dressed Dialysis Access: LU AVF +bruit    OP Dialysis Orders: AF TTS   4h  56kg  2/2 bath Hep none LUA AVF   - hect 9 ug IV tiw     Problem/Plan DKA / type 1 DM -  stabilizing, plans per admit. Please do not use LR or NS fluids without renal service approval.  OK to use straight D5W or D10W for BS support as needed.  Hyponatremia/ hyperkalemia - Na 129  K 5.1 today.  improved  with Glucose control and HD ESRD - on HD TTS.  Currently on schedule/off schedule last week due to staffing issues and increased patient volume. BP/ volume - BP's variable,  Amlodipine 10 mg daily started, on labetalol and hydralazine IV prn. Net UF 1210 8/27  Does not appear grossly volume overloaded.  Today attempt 2 L UF HD Visual loss-she tells me has resolved now, seen by ophthalmology/neuro, thought to be due to glucose/BP fluctuations.  Plan for further evaluation as OP. AMS - resolved,OX 3  .  thought to be due to metabolic dearrangements L foot osteomyelitis -Plan for 4 weeks vancomycin IV with HD with end date 12/19/20.  ID signed off.  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  Phos  5.8>4.6 Restarted binders. Continue VDRA  Ernest Haber, PA-C [facility] Beeper [phone] 11/29/2020,11:21 AM  LOS: 8 days   Labs: Basic Metabolic Panel: Recent Labs  Lab 11/27/20 0205 11/28/20 0150 11/28/20 1221 11/29/20 0051  NA 131* 128*  --  129*  K 4.2 4.0  --  5.1  CL 93* 91*  --  93*  CO2 23 22  --  22  GLUCOSE 350* 277* 408* 165*  BUN 38* 62*  --  84*  CREATININE 8.39* 10.14*  --  10.52*  CALCIUM 8.3* 8.2*  --  8.6*  PHOS 5.8* 5.8*  --  4.6   Liver Function Tests: Recent Labs  Lab 11/23/20 0735 11/24/20 0200 11/27/20 0205 11/28/20 0150 11/29/20 0051  AST 26  --   --   --   --   ALT 32  --   --   --   --   ALKPHOS 156*  --   --   --   --   BILITOT 0.9  --   --   --   --   PROT 6.6  --   --   --   --   ALBUMIN 3.0*  3.1*   < > 2.8* 2.6* 2.8*   < > = values in this interval not displayed.   No results for input(s): LIPASE,  AMYLASE in the last 168 hours. Recent Labs  Lab 11/23/20 0852  AMMONIA 51*   CBC: Recent Labs  Lab 11/25/20 0811 11/26/20 0116 11/27/20 0205 11/28/20 0150 11/29/20 0051  WBC 5.1 6.5 7.3 6.4 8.0  HGB 11.1* 11.1* 10.8* 10.3* 9.8*  HCT 34.3* 33.7* 32.0* 31.4* 29.4*  MCV 91.0 90.1 88.6 89.0 89.1  PLT 274 297 301 321 340   Cardiac Enzymes: No results for input(s): CKTOTAL, CKMB, CKMBINDEX, TROPONINI in the last 168 hours. CBG: Recent Labs  Lab 11/28/20 1938 11/28/20 2017 11/28/20 2155 11/28/20 2357 11/29/20 0816  GLUCAP 36* 78 68* 152* 171*    Studies/Results: No results found. Medications:  vancomycin Stopped (11/26/20 1555)    amLODipine  10 mg Oral Daily   aspirin EC  81 mg Oral Daily   calcium acetate  1,334 mg Oral TID WC   Chlorhexidine Gluconate Cloth  6 each Topical Q0600   Chlorhexidine Gluconate Cloth  6 each Topical Q0600   collagenase   Topical Daily   doxercalciferol  9 mcg Intravenous Q T,Th,Sa-HD   insulin aspart  0-15 Units Subcutaneous TID WC   insulin aspart  2 Units Subcutaneous TID WC   [START ON 11/30/2020] insulin glargine-yfgn  12 Units Subcutaneous  Daily   loratadine  10 mg Oral Daily   mupirocin ointment   Nasal BID

## 2020-11-29 NOTE — Progress Notes (Signed)
Inpatient Diabetes Program Recommendations  AACE/ADA: New Consensus Statement on Inpatient Glycemic Control (2015)  Target Ranges:  Prepandial:   less than 140 mg/dL      Peak postprandial:   less than 180 mg/dL (1-2 hours)      Critically ill patients:  140 - 180 mg/dL   Lab Results  Component Value Date   GLUCAP 171 (H) 11/29/2020   HGBA1C 8.1 (A) 11/02/2020    Review of Glycemic Control Results for [name], [name]" (MRN [id number]) as of 11/29/2020 09:21  Ref. Range 11/28/2020 08:44 11/28/2020 12:02 11/28/2020 16:44 11/28/2020 19:38 11/28/2020 20:17 11/28/2020 21:55 11/28/2020 23:57 11/29/2020 08:16  Glucose-Capillary Latest Ref Range: 70 - 99 mg/dL 435 (H) Novolog 6 units 415 (H) Novolog15 given @ 1502 pm 128 (H) Novolog 4 units @ 1745 pm 36 (LL) 78 68 (L) 152 (H) 171 (H)   Diabetes history: DM1 Outpatient Diabetes medications: Insulin Pump Current orders for Inpatient glycemic control: Semglee 14 units + Novolog 2 units tid meal coverage, Novolog 0-15 units tid with meals, 0-5 units hs  Inpatient Diabetes Program Recommendations:   Patient had hypoglycemia to 36 after receiving Novolog correction and meal coverage. Received total of 19 units given 2 hrs. Apart yesterday afternoon and Novolog correction increased to 0-15 units tid + hs 0-5.  -Decrease Novolog correction to 0-6 units tid + hs 0-5 units Secure chat sent to Dr. Candiss Norse.  Thank you, [name]. [name], RN, MSN, CDE  Diabetes Coordinator Inpatient Glycemic Control Team Team Pager [phone] (8am-5pm) 11/29/2020 9:29 AM

## 2020-11-29 NOTE — Progress Notes (Signed)
PT Cancellation Note  Patient Details Name: MEMORY HEINRICHS MRN: [id number] DOB: 10-23-1970   Cancelled Treatment:    Reason Eval/Treat Not Completed: Patient at procedure or test/unavailable  Pt out of room.   Arby Barrette, PT Pager [phone]  Rexanne Mano 11/29/2020, 1:44 PM

## 2020-11-29 NOTE — Progress Notes (Signed)
PROGRESS NOTE        PATIENT DETAILS Name: [name] Age: 50 y.o. Sex: female Date of Birth: [birth year] Admit Date: 11/21/2020 Admitting Physician Jacqlyn Krauss, MD NOB:[id number], Babs Bertin, PA-C  Brief Narrative: Patient is a 50 y.o. female with history of DM-1, diabetic retinopathy, chronic left foot osteomyelitis, ESRD on HD-recent history of MRSA bacteremia-presented with acute metabolic encephalopathy, DKA and left diabetic foot with osteomyelitis.  On 8/25-her encephalopathy finally improved-but was found to have bilateral visual loss with initial concern for seizure like activity on spot EEG-however no seizure noted on LTM EEG, but now felt to be due to BP/CBG fluctuation.  See below for further details.  Significant events: 7/14>> nausea/vomiting-left foot cellulitis-evaluated at urgent care in [place] on 7/14-blood cultures positive for MRSA.  Declined admission.  Subsequently received IV vancomycin with HD from 7/26-8/04 8/22>> presented to [facility] ED with confusion-found to have DKA 8/25>> finally more awake and alert-claims she cannot see out of both eyes.  Spot EEG w discharge from left hemisphere-EEG improved with Ativan 2 mg x 1  Significant studies: 8/22>> x-ray left foot: Osteomyelitis at the second phalange 8/22>> CXR: No pneumonia 8/22>> CT head: No acute intracranial abnormality 8/24>> CT maxillofacial area: No acute infection 8/25>> EEG: Generalized periodic discharges from left hemisphere-given 2 mg of IV Ativan-EEG improved. 8/25>> Echo: EF 60-65%-no obvious vegetations. 8/25>> MRI brain: No acute intracranial pathology 8/25-8/26>> LTM EEG: Cortical dysfunction in the left posterior quadrant-nonspecific etiology-no seizures or definite epileptiform discharges seen.  Antimicrobial therapy: Vancomycin: 8/22>> Cefepime: 8/22>>8/23  Microbiology data: 8/22>> COVID/influenza PCR: Negative 8/22>> blood culture: Negative so far 8/24>>  blood culture: Negative so far  Procedures : None  Consults: ID, nephrology, podiatry, neurology,opthalmology  DVT Prophylaxis : SCDs Start: 11/21/20 1037   Subjective:  Patient in bed in no distress, denies any headache chest or abdominal pain, vision improving in the left eye, minimally improved in the right eye, no focal weakness.  Assessment/Plan:  Acute metabolic encephalopathy: Multifactorial etiology-DKA/BUN elevation in setting of ESRD.  She seems to be back to baseline.  Questionable seizures: Initial spot EEG was suspicious for possible seizures-however LTM EEG was negative for seizures.  Neurology recommending to hold off on starting antiepileptics.  Bilateral visual loss: No clear-cut etiology identified-no pathology evident on ocular exam by ophthalmologist.  Per my discussion with primary ophthalmologist on 8/25-patient has a longstanding history of diabetic retinopathy (20/70 on right and 20/50 on left)-history of laser procedures/and eye injections every 8 weeks per primary ophthalmologist). No lesion seen on MRI brain.   Discussed with Dr. Mayford Knife MD on 8/26-suspicion that she may have had small infarcts in her bilateral optic nerves in the setting of blood pressure/CBG fluctuations in a background of significant diabetic retinopathy at baseline.  Doppler was recommended which does not show any significant stenosis.   Vision seems to be improving.    Ophthalmology recommending more specialized exam in the outpatient setting after discharge (follows with Ophthalmologist Dr Baird Cancer in [facility]).  Sepsis due to acute osteomyelitis involving the left second phalanx-possibility of underlying MRSA infection: Sepsis physiology has improved.  Patient had positive blood cultures for MRSA in [place] on 7/14-it appears that she declined admission and apparently received treatment with IV vancomycin with hemodialysis from 7/26-8/4.  Evaluated by podiatry on 8/23-she appears to have  more of a chronic osteomyelitis  rather than a acute osteomyelitis. Blood cultures on 8/22 negative so far-transthoracic echo without any vegetations.  ID recommending 4 weeks of vancomycin with HD-end date of 12/19/2020. Seen by podiatry who did offer her surgical intervention however patient has declined.  Facial swelling: Etiology felt to be either due to volume overload-improved after fluid removal with HD on 8/24.  CT maxillofacial area does not show any acute infection.    DKA, history of DM 1 on insulin pump (A1c 8.1 on 8/3):  DKA resolved with IV insulin.  Patient was transitioned to basal insulin.  There was concern that patient may not be able to appropriately use her insulin pump with visual impairment.  Glucose levels have been quite elevated.  Dose of long-acting insulin was increased today.  Continue SSI.  Prior to this hospitalization-patient was on an insulin pump-with her having visual issues-she would likely need to stay on basal/bolus regimen.  However we may need to revisit since her vision seems to be improving.   Recent Labs    11/28/20 2357 11/29/20 0816 11/29/20 1218  GLUCAP 152* 171* 116*    Diarrhea: Questionable etiology-unclear if patient has bowel related autonomic neuropathy in the setting of diabetes.  C. difficile studies and GI pathogen panel negative.  Will use prn Imodium.  Appears to have resolved.  ESRD (TTS): Nephrology following.   Normocytic anemia: Due to ESRD-defer need for EPO/IV iron to nephrology.  Hyperlipidemia: Can resume statin at discharge.  Essential hypertension/hyponatremia: BP fluctuating-but overall better controlled-continue amlodipine.  Sodium levels noted to be low.  Continue to monitor.  History of right transmetatarsal amputation  Social issues: Patient apparently lives with roommates.  May need rehabilitation in a skilled nursing facility.  PT yesterday who did recommend SNF.  But this morning OT seems to think that patient has  improved.  We will have PT reevaluate.  If she indeed has improved then she could possibly go home with home health.  Diet: Diet Order             Diet renal/carb modified with fluid restriction Diet-HS Snack? Nothing; Fluid restriction: 1200 mL Fluid; Room service appropriate? Yes; Fluid consistency: Thin  Diet effective now                    Code Status: Full code   Family Communication: Sister-Alicia [phone]   Disposition Plan: Status is: Inpatient  Remains inpatient appropriate because:Inpatient level of care appropriate due to severity of illness  Dispo: The patient is from: Home              Anticipated d/c is to: Home              Patient currently is not medically stable to d/c.   Difficult to place patient No   Barriers to Discharge: Resolving encephalopathy-now with seizure disorder-blindness-work-up in progress.  See above.    Antimicrobial agents: Anti-infectives (From admission, onward)    Start     Dose/Rate Route Frequency Ordered Stop   11/23/20 1800  ceFEPIme (MAXIPIME) 1 g in sodium chloride 0.9 % 100 mL IVPB  Status:  Discontinued        1 g 200 mL/hr over 30 Minutes Intravenous Daily-1800 11/23/20 1126 11/23/20 1145   11/23/20 1030  vancomycin (VANCOREADY) IVPB 750 mg/150 mL        750 mg 150 mL/hr over 60 Minutes Intravenous To Hemodialysis 11/23/20 1021 11/23/20 1305   11/23/20 1030  ceFEPIme (MAXIPIME) 2 g in sodium  chloride 0.9 % 100 mL IVPB  Status:  Discontinued        2 g 200 mL/hr over 30 Minutes Intravenous To Hemodialysis 11/23/20 1021 11/23/20 1125   11/22/20 1800  ceFEPIme (MAXIPIME) 2 g in sodium chloride 0.9 % 100 mL IVPB  Status:  Discontinued        2 g 200 mL/hr over 30 Minutes Intravenous Every T-Th-Sa (Hemodialysis) 11/21/20 2018 11/22/20 0816   11/22/20 1800  ceFEPIme (MAXIPIME) 2 g in sodium chloride 0.9 % 100 mL IVPB  Status:  Discontinued        2 g 200 mL/hr over 30 Minutes Intravenous Every T-Th-Sa (1800)  11/22/20 0816 11/23/20 1125   11/22/20 1500  vancomycin (VANCOREADY) IVPB 750 mg/150 mL        750 mg 150 mL/hr over 60 Minutes Intravenous Every T-Th-Sa (Hemodialysis) 11/22/20 1357 12/19/20 2359   11/22/20 1000  ceFEPIme (MAXIPIME) 2 g in sodium chloride 0.9 % 100 mL IVPB  Status:  Discontinued       Note to Pharmacy: Pharmacy to dose   2 g 200 mL/hr over 30 Minutes Intravenous Every 24 hours 11/21/20 1035 11/21/20 2017   11/22/20 0435  vancomycin variable dose per unstable renal function (pharmacist dosing)  Status:  Discontinued         Does not apply See admin instructions 11/22/20 0435 11/23/20 1022   11/21/20 0700  vancomycin (VANCOCIN) IVPB 1000 mg/200 mL premix        1,000 mg 200 mL/hr over 60 Minutes Intravenous  Once 11/21/20 0648 11/21/20 1247   11/21/20 0700  ceFEPIme (MAXIPIME) 2 g in sodium chloride 0.9 % 100 mL IVPB        2 g 200 mL/hr over 30 Minutes Intravenous  Once 11/21/20 0656 11/21/20 1022   11/21/20 0645  metroNIDAZOLE (FLAGYL) IVPB 500 mg        500 mg 100 mL/hr over 60 Minutes Intravenous  Once 11/21/20 0636 11/21/20 0816          MEDICATIONS: Scheduled Meds:  amLODipine  10 mg Oral Daily   aspirin EC  81 mg Oral Daily   calcium acetate  1,334 mg Oral TID WC   Chlorhexidine Gluconate Cloth  6 each Topical Q0600   Chlorhexidine Gluconate Cloth  6 each Topical Q0600   collagenase   Topical Daily   darbepoetin (ARANESP) injection - DIALYSIS  25 mcg Intravenous Q Tue-HD   doxercalciferol  9 mcg Intravenous Q T,Th,Sa-HD   insulin aspart  0-15 Units Subcutaneous TID WC   insulin aspart  2 Units Subcutaneous TID WC   [START ON 11/30/2020] insulin glargine-yfgn  12 Units Subcutaneous Daily   loratadine  10 mg Oral Daily   mupirocin ointment   Nasal BID   Continuous Infusions:  vancomycin Stopped (11/26/20 1555)   PRN Meds:.clonazePAM, dextrose, diphenhydrAMINE, haloperidol lactate, hydrALAZINE, labetalol, melatonin, methocarbamol, ondansetron (ZOFRAN)  IV   PHYSICAL EXAM: Vital signs: Vitals:   11/29/20 0738 11/29/20 0813 11/29/20 1214 11/29/20 1402  BP: (!) 174/77 (!) 163/69 106/77 (!) 83/34  Pulse: 85 79 82 81  Resp: 18 18 15 16   Temp: (!) 97.5 F (36.4 C) (!) 97.5 F (36.4 C) (!) 97.5 F (36.4 C)   TempSrc: Oral Axillary Axillary   SpO2: 100% 100% 100%   Weight:      Height:       Filed Weights   11/23/20 1347 11/26/20 1320 11/26/20 1555  Weight: 57.6 kg 58.1 kg 56.9 kg  Body mass index is 19.65 kg/m.   Exam:  Awake Alert, No new F.N deficits, poor vision in both eyes but mild improvement in the left vision [place].AT,PERRAL Supple Neck,No JVD, No cervical lymphadenopathy appriciated.  Symmetrical Chest wall movement, Good air movement bilaterally, CTAB RRR,No Gallops, Rubs or new Murmurs, No Parasternal Heave +ve B.Sounds, Abd Soft, No tenderness, No organomegaly appriciated, No rebound - guarding or rigidity. No Cyanosis, Clubbing or edema, No new Rash or bruise    I have personally reviewed following labs and imaging studies  LABORATORY DATA: CBC: Recent Labs  Lab 11/25/20 0811 11/26/20 0116 11/27/20 0205 11/28/20 0150 11/29/20 0051  WBC 5.1 6.5 7.3 6.4 8.0  HGB 11.1* 11.1* 10.8* 10.3* 9.8*  HCT 34.3* 33.7* 32.0* 31.4* 29.4*  MCV 91.0 90.1 88.6 89.0 89.1  PLT 274 297 301 321 665    Basic Metabolic Panel: Recent Labs  Lab 11/25/20 0811 11/26/20 0116 11/27/20 0205 11/28/20 0150 11/28/20 1221 11/29/20 0051  NA 137 133* 131* 128*  --  129*  K 4.2 4.5 4.2 4.0  --  5.1  CL 100 95* 93* 91*  --  93*  CO2 20* 18* 23 22  --  22  GLUCOSE 103* 230* 350* 277* 408* 165*  BUN 39* 47* 38* 62*  --  84*  CREATININE 9.07* 10.24* 8.39* 10.14*  --  10.52*  CALCIUM 8.0* 8.0* 8.3* 8.2*  --  8.6*  PHOS 6.7* 7.2* 5.8* 5.8*  --  4.6    GFR: Estimated Creatinine Clearance: 5.8 mL/min (A) (by C-G formula based on SCr of 10.52 mg/dL (H)).  Liver Function Tests: Recent Labs  Lab 11/23/20 0735 11/24/20 0200  11/25/20 0811 11/26/20 0116 11/27/20 0205 11/28/20 0150 11/29/20 0051  AST 26  --   --   --   --   --   --   ALT 32  --   --   --   --   --   --   ALKPHOS 156*  --   --   --   --   --   --   BILITOT 0.9  --   --   --   --   --   --   PROT 6.6  --   --   --   --   --   --   ALBUMIN 3.0*  3.1*   < > 2.8* 2.8* 2.8* 2.6* 2.8*   < > = values in this interval not displayed.  No results for input(s): LIPASE, AMYLASE in the last 168 hours.  Recent Labs  Lab 11/23/20 0852  AMMONIA 51*       Sepsis Labs: Lactic Acid, Venous    Component Value Date/Time   LATICACIDVEN 1.6 11/21/2020 1227      RADIOLOGY STUDIES/RESULTS: No results found.   LOS: 8 days   Signature  Lala Lund M.D on 11/29/2020 at 2:43 PM   -  To page go to [email or web address]

## 2020-11-29 NOTE — Progress Notes (Addendum)
Hemodialysis- Tolerated treatment well. Patient continues to lie on access arm causing bleeding intermittently throughout the treatment. Continued to discuss the importance of access safety with patient. Report called to primary RN. All vitals WNL post rinseback. 1.2L removed.

## 2020-11-30 ENCOUNTER — Encounter (HOSPITAL_BASED_OUTPATIENT_CLINIC_OR_DEPARTMENT_OTHER): Payer: Medicare HMO | Admitting: Internal Medicine

## 2020-11-30 LAB — RENAL FUNCTION PANEL
Albumin: 2.5 g/dL — ABNORMAL LOW (ref 3.5–5.0)
Anion gap: 11 (ref 5–15)
BUN: 48 mg/dL — ABNORMAL HIGH (ref 6–20)
CO2: 26 mmol/L (ref 22–32)
Calcium: 8.3 mg/dL — ABNORMAL LOW (ref 8.9–10.3)
Chloride: 94 mmol/L — ABNORMAL LOW (ref 98–111)
Creatinine, Ser: 6.79 mg/dL — ABNORMAL HIGH (ref 0.44–1.00)
GFR, Estimated: 7 mL/min — ABNORMAL LOW (ref >60)
Glucose, Bld: 330 mg/dL — ABNORMAL HIGH (ref 70–99)
Phosphorus: 3.5 mg/dL (ref 2.5–4.6)
Potassium: 4.1 mmol/L (ref 3.5–5.1)
Sodium: 131 mmol/L — ABNORMAL LOW (ref 135–145)

## 2020-11-30 LAB — GLUCOSE, CAPILLARY
Glucose-Capillary: 373 mg/dL — ABNORMAL HIGH (ref 70–99)
Glucose-Capillary: 94 mg/dL (ref 70–99)
Glucose-Capillary: 132 mg/dL — ABNORMAL HIGH (ref 70–99)
Glucose-Capillary: 178 mg/dL — ABNORMAL HIGH (ref 70–99)

## 2020-11-30 LAB — CBC
HCT: 27.7 % — ABNORMAL LOW (ref 36.0–46.0)
Hemoglobin: 9.1 g/dL — ABNORMAL LOW (ref 12.0–15.0)
MCH: 30.2 pg (ref 26.0–34.0)
MCHC: 32.9 g/dL (ref 30.0–36.0)
MCV: 92 fL (ref 80.0–100.0)
Platelets: 280 10*3/uL (ref 150–400)
RBC: 3.01 MIL/uL — ABNORMAL LOW (ref 3.87–5.11)
RDW: 14.3 % (ref 11.5–15.5)
WBC: 5.8 10*3/uL (ref 4.0–10.5)
nRBC: 0 % (ref 0.0–0.2)

## 2020-11-30 MED ORDER — INSULIN ASPART 100 UNIT/ML IJ SOLN: 0.0000 [IU] | Freq: Every day | INTRAMUSCULAR | Status: DC

## 2020-11-30 MED ORDER — OMEGA-3-ACID ETHYL ESTERS 1 G PO CAPS
1.0000 g | ORAL_CAPSULE | Freq: Every day | ORAL | Status: DC
  Administered 2020-12-01: 1 g via ORAL
  Filled 2020-11-30 (×2): qty 1

## 2020-11-30 MED ORDER — INSULIN ASPART 100 UNIT/ML IJ SOLN
0.0000 [IU] | Freq: Three times a day (TID) | INTRAMUSCULAR | Status: DC
  Administered 2020-11-30: 2 [IU] via SUBCUTANEOUS
  Administered 2020-12-01: 11 [IU] via SUBCUTANEOUS
  Administered 2020-12-01: 15 [IU] via SUBCUTANEOUS

## 2020-11-30 MED ORDER — INSULIN ASPART 100 UNIT/ML IJ SOLN: 4.0000 [IU] | Freq: Three times a day (TID) | INTRAMUSCULAR | Status: DC

## 2020-11-30 MED ORDER — PRAVASTATIN SODIUM 40 MG PO TABS
40.0000 mg | ORAL_TABLET | Freq: Every day | ORAL | Status: DC
  Administered 2020-11-30 – 2020-12-01 (×2): 40 mg via ORAL
  Filled 2020-11-30 (×2): qty 1

## 2020-11-30 MED ORDER — INSULIN ASPART 100 UNIT/ML IJ SOLN
3.0000 [IU] | Freq: Three times a day (TID) | INTRAMUSCULAR | Status: DC
  Administered 2020-11-30 – 2020-12-01 (×4): 3 [IU] via SUBCUTANEOUS

## 2020-11-30 MED ORDER — INSULIN GLARGINE-YFGN 100 UNIT/ML ~~LOC~~ SOLN
15.0000 [IU] | Freq: Every day | SUBCUTANEOUS | Status: DC
  Administered 2020-11-30 – 2020-12-01 (×2): 15 [IU] via SUBCUTANEOUS
  Filled 2020-11-30 (×2): qty 0.15

## 2020-11-30 NOTE — Progress Notes (Signed)
[facility] Progress Note   Subjective: Seen in room. Says she is going home tomorrow but apparently no DC plan in place. Says she wants a hot fudge Sundae.   Objective Vitals:   11/29/20 2359 11/30/20 0335 11/30/20 0738 11/30/20 1115  BP: (!) 149/75 (!) 126/52 (!) 145/66 135/85  Pulse: 84 86 85 89  Resp: 15 17 18 18   Temp: 98.2 F (36.8 C) 97.8 F (36.6 C) 98.1 F (36.7 C) 98.8 F (37.1 C)  TempSrc: Oral Oral Axillary Oral  SpO2: 100%  98% 98%  Weight:      Height:       Physical Exam General: Heart: Lungs: Abdomen: Extremities: Dialysis Access:   Dialysis Orders:  Additional Objective Labs: Basic Metabolic Panel: Recent Labs  Lab 11/28/20 0150 11/28/20 1221 11/29/20 0051 11/30/20 0130  NA 128*  --  129* 131*  K 4.0  --  5.1 4.1  CL 91*  --  93* 94*  CO2 22  --  22 26  GLUCOSE 277* 408* 165* 330*  BUN 62*  --  84* 48*  CREATININE 10.14*  --  10.52* 6.79*  CALCIUM 8.2*  --  8.6* 8.3*  PHOS 5.8*  --  4.6 3.5   Liver Function Tests: Recent Labs  Lab 11/28/20 0150 11/29/20 0051 11/30/20 0130  ALBUMIN 2.6* 2.8* 2.5*   No results for input(s): LIPASE, AMYLASE in the last 168 hours. CBC: Recent Labs  Lab 11/26/20 0116 11/27/20 0205 11/28/20 0150 11/29/20 0051 11/30/20 0130  WBC 6.5 7.3 6.4 8.0 5.8  HGB 11.1* 10.8* 10.3* 9.8* 9.1*  HCT 33.7* 32.0* 31.4* 29.4* 27.7*  MCV 90.1 88.6 89.0 89.1 92.0  PLT 297 301 321 340 280   Blood Culture    Component Value Date/Time   SDES BLOOD RIGHT HAND 11/23/2020 1527   SPECREQUEST  11/23/2020 1527    BOTTLES DRAWN AEROBIC AND ANAEROBIC Blood Culture results may not be optimal due to an excessive volume of blood received in culture bottles   CULT  11/23/2020 1527    NO GROWTH 5 DAYS Performed at [facility], [place] [address], [place], [place] [address]    REPTSTATUS 11/28/2020 FINAL 11/23/2020 1527    Cardiac Enzymes: No results for input(s): CKTOTAL, CKMB, CKMBINDEX, TROPONINI in  the last 168 hours. CBG: Recent Labs  Lab 11/29/20 1218 11/29/20 1807 11/29/20 2134 11/30/20 0735 11/30/20 1114  GLUCAP 116* 143* 197* 373* 94   Iron Studies: No results for input(s): IRON, TIBC, TRANSFERRIN, FERRITIN in the last 72 hours. @lablastinr3 @ Studies/Results: No results found. Medications:  vancomycin 750 mg (11/29/20 1622)    amLODipine  10 mg Oral Daily   aspirin EC  81 mg Oral Daily   calcium acetate  1,334 mg Oral TID WC   Chlorhexidine Gluconate Cloth  6 each Topical Q0600   Chlorhexidine Gluconate Cloth  6 each Topical Q0600   collagenase   Topical Daily   darbepoetin (ARANESP) injection - DIALYSIS  25 mcg Intravenous Q Tue-HD   doxercalciferol  9 mcg Intravenous Q T,Th,Sa-HD   insulin aspart  0-15 Units Subcutaneous TID WC   insulin aspart  0-5 Units Subcutaneous QHS   insulin aspart  3 Units Subcutaneous TID WC   insulin glargine-yfgn  15 Units Subcutaneous Daily   loratadine  10 mg Oral Daily   mupirocin ointment   Nasal BID   [START ON 12/01/2020] omega-3 acid ethyl esters  1 g Oral Daily   pravastatin  40 mg  Oral q1800     OP Dialysis Orders: AF TTS   4h  56kg  2/2 bath Hep none LUA AVF   - hect 9 ug IV tiw     Assessment/Plan DKA / type 1 DM -  stabilizing, plans per admit. Please do not use LR or NS fluids without renal service approval.  OK to use straight D5W or D10W for BS support as needed.  Hyponatremia/ hyperkalemia - Na 131 K 4.1 today.  improved  with Glucose control and HD ESRD - on HD TTS. Next HD 12/01/2020 BP/ volume - BP's variable,  Amlodipine 10 mg daily started, on labetalol and hydralazine IV prn. Net UF 1250  Does not appear grossly volume overloaded. UF as tolerated Visual loss-Now resolved. Seen by ophthalmology/neuro, thought to be due to glucose/BP fluctuations.  Plan for further evaluation as OP. AMS - resolved,OX 3. Thought to be due to metabolic dearrangements L foot osteomyelitis -Plan for 4 weeks vancomycin IV with HD  with end date 12/19/20.  ID signed off.  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  Phos  5.8>4.6 Restarted binders. Continue VDRA  Disposition: MSW attempting to find SNF placement. Stable for DC from nephrology stand point.     Charlissa Petros H. Monta Police NP-C 11/30/2020, 12:45 PM  [employer] [phone]

## 2020-11-30 NOTE — Progress Notes (Signed)
PROGRESS NOTE        PATIENT DETAILS Name: [name] Age: 50 y.o. Sex: female Date of Birth: Dec 08, 1970 Admit Date: 11/21/2020 Admitting Physician Jacqlyn Krauss, MD SNK:[id number], Babs Bertin, PA-C  Brief Narrative: Patient is a 50 y.o. female with history of DM-1, diabetic retinopathy, chronic left foot osteomyelitis, ESRD on HD-recent history of MRSA bacteremia-presented with acute metabolic encephalopathy, DKA and left diabetic foot with osteomyelitis.  On 8/25-her encephalopathy finally improved-but was found to have bilateral visual loss with initial concern for seizure like activity on spot EEG-however no seizure noted on LTM EEG, but now felt to be due to BP/CBG fluctuation.  See below for further details.  Significant events: 7/14>> nausea/vomiting-left foot cellulitis-evaluated at urgent care in [place] on 7/14-blood cultures positive for MRSA.  Declined admission.  Subsequently received IV vancomycin with HD from 7/26-8/04 8/22>> presented to [facility] ED with confusion-found to have DKA 8/25>> finally more awake and alert-claims she cannot see out of both eyes.  Spot EEG w discharge from left hemisphere-EEG improved with Ativan 2 mg x 1  Significant studies: 8/22>> x-ray left foot: Osteomyelitis at the second phalange 8/22>> CXR: No pneumonia 8/22>> CT head: No acute intracranial abnormality 8/24>> CT maxillofacial area: No acute infection 8/25>> EEG: Generalized periodic discharges from left hemisphere-given 2 mg of IV Ativan-EEG improved. 8/25>> Echo: EF 60-65%-no obvious vegetations. 8/25>> MRI brain: No acute intracranial pathology 8/25-8/26>> LTM EEG: Cortical dysfunction in the left posterior quadrant-nonspecific etiology-no seizures or definite epileptiform discharges seen.  Antimicrobial therapy: Vancomycin: 8/22>> Cefepime: 8/22>>8/23  Microbiology data: 8/22>> COVID/influenza PCR: Negative 8/22>> blood culture: Negative so far 8/24>>  blood culture: Negative so far  Procedures : None  Consults: ID, nephrology, podiatry, neurology,opthalmology  DVT Prophylaxis : SCDs Start: 11/21/20 1037   Subjective:  Patient in bed, appears comfortable, denies any headache, no fever, no chest pain or pressure, no shortness of breath , no abdominal pain. No new focal weakness.Vision is improving.    Assessment/Plan:  Acute metabolic encephalopathy: Multifactorial etiology-DKA/BUN elevation in setting of ESRD.  She seems to be back to baseline.  Questionable seizures: Initial spot EEG was suspicious for possible seizures-however LTM EEG was negative for seizures.  Neurology recommending to hold off on starting antiepileptics.  Bilateral visual loss: No clear-cut etiology identified-no pathology evident on ocular exam by ophthalmologist.  Per my discussion with primary ophthalmologist on 8/25-patient has a longstanding history of diabetic retinopathy (20/70 on right and 20/50 on left)-history of laser procedures/and eye injections every 8 weeks per primary ophthalmologist). No lesion seen on MRI brain.   Discussed with Dr. Mayford Knife MD on 8/26-suspicion that she may have had small infarcts in her bilateral optic nerves in the setting of blood pressure/CBG fluctuations in a background of significant diabetic retinopathy at baseline.  Doppler was recommended which does not show any significant stenosis. Vision seems to be improving. Post DC follow with Opth Dr Baird Cancer.  Ophthalmology recommending more specialized exam in the outpatient setting after discharge (follows with Ophthalmologist Dr Baird Cancer in [facility]).  Sepsis due to acute osteomyelitis involving the left second phalanx-possibility of underlying MRSA infection: Sepsis physiology has improved.  Patient had positive blood cultures for MRSA in [place] on 7/14-it appears that she declined admission and apparently received treatment with IV vancomycin with hemodialysis from 7/26-8/4.   Evaluated by podiatry on 8/23-she appears to  have more of a chronic osteomyelitis rather than a acute osteomyelitis. Blood cultures on 8/22 negative so far-transthoracic echo without any vegetations.  ID recommending 4 weeks of vancomycin with HD-end date of 12/19/2020. Seen by podiatry who did offer her surgical intervention however patient has declined.  Facial swelling: Etiology felt to be either due to volume overload-improved after fluid removal with HD on 8/24.  CT maxillofacial area does not show any acute infection.    DKA, history of DM 1 on insulin pump (A1c 8.1 on 8/3):  DKA resolved with IV insulin.  Patient was transitioned to basal insulin.  There was concern that patient may not be able to appropriately use her insulin pump with visual impairment.  Glucose levels have been quite elevated.  Dose of long-acting insulin was increased today.  Continue SSI.  Prior to this hospitalization-patient was on an insulin pump-with her having visual issues-she would likely need to stay on basal/bolus regimen.  However we may need to revisit since her vision seems to be improving.   Recent Labs    11/29/20 2134 11/30/20 0735 11/30/20 1114  GLUCAP 197* 373* 94    Diarrhea: Questionable etiology-unclear if patient has bowel related autonomic neuropathy in the setting of diabetes.  C. difficile studies and GI pathogen panel negative.  Will use prn Imodium.  Appears to have resolved.  ESRD (TTS): Nephrology following.   Normocytic anemia: Due to ESRD-defer need for EPO/IV iron to nephrology.  Hyperlipidemia: Can resume statin at discharge.  Essential hypertension/hyponatremia: BP fluctuating-but overall better controlled-continue amlodipine.  Sodium levels noted to be low.  Continue to monitor.  History of right transmetatarsal amputation  Social issues: Patient apparently lives with roommates.  May need rehabilitation in a skilled nursing facility.  PT yesterday who did recommend SNF.  But  this morning OT seems to think that patient has improved.  We will have PT reevaluate.  If she indeed has improved then she could possibly go home with home health.  Diet: Diet Order             Diet renal/carb modified with fluid restriction Diet-HS Snack? Nothing; Fluid restriction: 1200 mL Fluid; Room service appropriate? Yes; Fluid consistency: Thin  Diet effective now                    Code Status: Full code   Family Communication: Sister-Alicia [phone]   Disposition Plan: Status is: Inpatient  Remains inpatient appropriate because:Inpatient level of care appropriate due to severity of illness  Dispo: The patient is from: Home              Anticipated d/c is to: Home              Patient currently is not medically stable to d/c.   Difficult to place patient No   Barriers to Discharge: Resolving encephalopathy-now with seizure disorder-blindness-work-up in progress.  See above.    Antimicrobial agents: Anti-infectives (From admission, onward)    Start     Dose/Rate Route Frequency Ordered Stop   11/23/20 1800  ceFEPIme (MAXIPIME) 1 g in sodium chloride 0.9 % 100 mL IVPB  Status:  Discontinued        1 g 200 mL/hr over 30 Minutes Intravenous Daily-1800 11/23/20 1126 11/23/20 1145   11/23/20 1030  vancomycin (VANCOREADY) IVPB 750 mg/150 mL        750 mg 150 mL/hr over 60 Minutes Intravenous To Hemodialysis 11/23/20 1021 11/23/20 1305   11/23/20 1030  ceFEPIme (MAXIPIME) 2 g in sodium chloride 0.9 % 100 mL IVPB  Status:  Discontinued        2 g 200 mL/hr over 30 Minutes Intravenous To Hemodialysis 11/23/20 1021 11/23/20 1125   11/22/20 1800  ceFEPIme (MAXIPIME) 2 g in sodium chloride 0.9 % 100 mL IVPB  Status:  Discontinued        2 g 200 mL/hr over 30 Minutes Intravenous Every T-Th-Sa (Hemodialysis) 11/21/20 2018 11/22/20 0816   11/22/20 1800  ceFEPIme (MAXIPIME) 2 g in sodium chloride 0.9 % 100 mL IVPB  Status:  Discontinued        2 g 200 mL/hr  over 30 Minutes Intravenous Every T-Th-Sa (1800) 11/22/20 0816 11/23/20 1125   11/22/20 1500  vancomycin (VANCOREADY) IVPB 750 mg/150 mL        750 mg 150 mL/hr over 60 Minutes Intravenous Every T-Th-Sa (Hemodialysis) 11/22/20 1357 12/19/20 2359   11/22/20 1000  ceFEPIme (MAXIPIME) 2 g in sodium chloride 0.9 % 100 mL IVPB  Status:  Discontinued       Note to Pharmacy: Pharmacy to dose   2 g 200 mL/hr over 30 Minutes Intravenous Every 24 hours 11/21/20 1035 11/21/20 2017   11/22/20 0435  vancomycin variable dose per unstable renal function (pharmacist dosing)  Status:  Discontinued         Does not apply See admin instructions 11/22/20 0435 11/23/20 1022   11/21/20 0700  vancomycin (VANCOCIN) IVPB 1000 mg/200 mL premix        1,000 mg 200 mL/hr over 60 Minutes Intravenous  Once 11/21/20 0648 11/21/20 1247   11/21/20 0700  ceFEPIme (MAXIPIME) 2 g in sodium chloride 0.9 % 100 mL IVPB        2 g 200 mL/hr over 30 Minutes Intravenous  Once 11/21/20 0656 11/21/20 1022   11/21/20 0645  metroNIDAZOLE (FLAGYL) IVPB 500 mg        500 mg 100 mL/hr over 60 Minutes Intravenous  Once 11/21/20 0636 11/21/20 0816          MEDICATIONS: Scheduled Meds:  amLODipine  10 mg Oral Daily   aspirin EC  81 mg Oral Daily   calcium acetate  1,334 mg Oral TID WC   Chlorhexidine Gluconate Cloth  6 each Topical Q0600   Chlorhexidine Gluconate Cloth  6 each Topical Q0600   collagenase   Topical Daily   darbepoetin (ARANESP) injection - DIALYSIS  25 mcg Intravenous Q Tue-HD   doxercalciferol  9 mcg Intravenous Q T,Th,Sa-HD   insulin aspart  0-15 Units Subcutaneous TID WC   insulin aspart  0-5 Units Subcutaneous QHS   insulin aspart  3 Units Subcutaneous TID WC   insulin glargine-yfgn  15 Units Subcutaneous Daily   loratadine  10 mg Oral Daily   mupirocin ointment   Nasal BID   [START ON 12/01/2020] omega-3 acid ethyl esters  1 g Oral Daily   pravastatin  40 mg Oral q1800   Continuous Infusions:   vancomycin 750 mg (11/29/20 1622)   PRN Meds:.clonazePAM, dextrose, diphenhydrAMINE, haloperidol lactate, hydrALAZINE, labetalol, melatonin, methocarbamol, ondansetron (ZOFRAN) IV   PHYSICAL EXAM: Vital signs: Vitals:   11/29/20 2359 11/30/20 0335 11/30/20 0738 11/30/20 1115  BP: (!) 149/75 (!) 126/52 (!) 145/66 135/85  Pulse: 84 86 85 89  Resp: 15 17 18 18   Temp: 98.2 F (36.8 C) 97.8 F (36.6 C) 98.1 F (36.7 C) 98.8 F (37.1 C)  TempSrc: Oral Oral Axillary Oral  SpO2: 100%  98% 98%  Weight:      Height:       Filed Weights   11/26/20 1555 11/29/20 1320 11/29/20 1729  Weight: 56.9 kg 59.8 kg 58.6 kg   Body mass index is 20.23 kg/m.   Exam:   Awake Alert, No new F.N deficits, vision is gradually improving left more than right [place].AT,PERRAL Supple Neck,No JVD, No cervical lymphadenopathy appriciated.  Symmetrical Chest wall movement, Good air movement bilaterally, CTAB RRR,No Gallops, Rubs or new Murmurs, No Parasternal Heave +ve B.Sounds, Abd Soft, No tenderness, No organomegaly appriciated, No rebound - guarding or rigidity. No Cyanosis, Clubbing or edema, No new Rash or bruise   I have personally reviewed following labs and imaging studies  LABORATORY DATA: CBC: Recent Labs  Lab 11/26/20 0116 11/27/20 0205 11/28/20 0150 11/29/20 0051 11/30/20 0130  WBC 6.5 7.3 6.4 8.0 5.8  HGB 11.1* 10.8* 10.3* 9.8* 9.1*  HCT 33.7* 32.0* 31.4* 29.4* 27.7*  MCV 90.1 88.6 89.0 89.1 92.0  PLT 297 301 321 340 321    Basic Metabolic Panel: Recent Labs  Lab 11/26/20 0116 11/27/20 0205 11/28/20 0150 11/28/20 1221 11/29/20 0051 11/30/20 0130  NA 133* 131* 128*  --  129* 131*  K 4.5 4.2 4.0  --  5.1 4.1  CL 95* 93* 91*  --  93* 94*  CO2 18* 23 22  --  22 26  GLUCOSE 230* 350* 277* 408* 165* 330*  BUN 47* 38* 62*  --  84* 48*  CREATININE 10.24* 8.39* 10.14*  --  10.52* 6.79*  CALCIUM 8.0* 8.3* 8.2*  --  8.6* 8.3*  PHOS 7.2* 5.8* 5.8*  --  4.6 3.5     GFR: Estimated Creatinine Clearance: 9.3 mL/min (A) (by C-G formula based on SCr of 6.79 mg/dL (H)).  Liver Function Tests: Recent Labs  Lab 11/26/20 0116 11/27/20 0205 11/28/20 0150 11/29/20 0051 11/30/20 0130  ALBUMIN 2.8* 2.8* 2.6* 2.8* 2.5*  No results for input(s): LIPASE, AMYLASE in the last 168 hours.  No results for input(s): AMMONIA in the last 168 hours.   Sepsis Labs: Lactic Acid, Venous    Component Value Date/Time   LATICACIDVEN 1.6 11/21/2020 1227   RADIOLOGY STUDIES/RESULTS: No results found.   LOS: 9 days   Signature  Lala Lund M.D on 11/30/2020 at 11:46 AM   -  To page go to [email or web address]

## 2020-11-30 NOTE — Progress Notes (Signed)
PT Cancellation Note  Patient Details Name: [name] MRN: [id number] DOB: [birth year]   Cancelled Treatment:    Reason Eval/Treat Not Completed: Fatigue/lethargy limiting ability to participate  Patient sleeping soundly on arrival. Awakened and stated she was too exhausted. Pt promptly falling back asleep. Will attempt later in day as schedule permits.   Arby Barrette, PT Pager [phone]  Rexanne Mano 11/30/2020, 10:11 AM

## 2020-11-30 NOTE — TOC Progression Note (Addendum)
Transition of Care [facility]) - Progression Note    Patient Details  Name: NASTASIA KAGE MRN: [id number] Date of Birth: 1971/03/06  Transition of Care [facility]) CM/SW [place], LCSW Phone Number: 11/30/2020, 11:45 AM  Clinical Narrative:    CSW spoke with patient and made her aware that [facility] is unable to accept her. She stated she does not want to go to any other facilities and is accepting of home health services as long as it is not through [facility]. CSW asked [facility] to review for PT/OT/aide.   CSW confirmed address on Facesheet and patient's PCP ([place]). Patient uses [employer] for transportation and her sister checks on her. She asked CSW for info on private pay agencies to help clean her house. CSW referred her to a Producer, television/film/video.    Patient does not require any additional DME as she has a rollator, manual wheelchair, electric wheelchair at home.  UPDATE: [facility] has accepted patient.    Expected Discharge Plan: [place] Barriers to Discharge: Continued Medical Work up  Expected Discharge Plan and Services Expected Discharge Plan: [place] In-house Referral: Clinical Social Work Discharge Planning Services: CM Consult Post Acute Care Choice: [place] arrangements for the past 2 months: Apartment                           HH Arranged: PT, OT, Nurse's Aide           Social Determinants of Health (SDOH) Interventions    Readmission Risk Interventions Readmission Risk Prevention Plan 11/30/2020 08/09/2018  Transportation Screening Complete Complete  PCP or Specialist Appt within 5-7 Days - Complete  Home Care Screening - Complete  Medication Review (RN CM) - Complete  Medication Review (RN Transport planner) Referral to Pharmacy -  PCP or Specialist appointment within 3-5 days of discharge Complete -  [place] or Home Care Consult Complete -  SW Recovery Care/Counseling Consult Complete -   [place] Patient Refused -  Some recent data might be hidden

## 2020-11-30 NOTE — Progress Notes (Signed)
Physical Therapy Treatment Patient Details Name: [name] MRN: [id number] DOB: 1970/06/23 Today's Date: 11/30/2020    History of Present Illness 50 y.o. female presented 11/21/20  with acute metabolic encephalopathy, DKA left diabetic foot with osteomyelitis. On 8/25-her encephalopathy finally improved-but was found to have bilateral visual loss /26-suspicion that she may have had small infarcts in her bilateral optic nerves in the setting of blood pressure/CBG fluctuations in a background of significant diabetic retinopathy at baseline. Refusing left second toe amputation. PMH: DM-1, diabetic retinopathy, chronic left foot osteomyelitis, ESRD on HD-recent history of MRSA bacteremia- and left diabetic foot with osteomyelitis.    PT Comments    Pt had initially reported she did not ambulate, however, today reports she does ambulate short distances in her home, but limits due to condition of feet/wounds/risk of wounds.  She was able to ambulate 80' with rollator and min A - limited distance due to foot wounds.  Pt does require cues for safety and acknowledges that she is noncompliant in her medical care. Progress as able   Follow Up Recommendations  SNF;Supervision for mobility/OOB     Equipment Recommendations  None recommended by PT    Recommendations for Other Services       Precautions / Restrictions Precautions Precautions: Fall    Mobility  Bed Mobility   Bed Mobility: Supine to Sit;Sit to Supine     Supine to sit: Modified independent (Device/Increase time) Sit to supine: Modified independent (Device/Increase time)        Transfers Overall transfer level: Needs assistance Equipment used: 4-wheeled walker Transfers: Sit to/from Stand Sit to Stand: Supervision         General transfer comment: Supervision for safety  Ambulation/Gait Ambulation/Gait assistance: Min assist Gait Distance (Feet): 50 Feet Assistive device: 4-wheeled walker Gait  Pattern/deviations: Step-to pattern;Decreased stride length Gait velocity: decreased   General Gait Details: Min A due to visual deficits; limited distance due to foot wounds   Stairs             Wheelchair Mobility    Modified Rankin (Stroke Patients Only)       Balance Overall balance assessment: History of Falls   Sitting balance-Leahy Scale: Normal     Standing balance support: Bilateral upper extremity supported Standing balance-Leahy Scale: Poor Standing balance comment: requires UE support                            Cognition Arousal/Alertness: Awake/alert Behavior During Therapy: WFL for tasks assessed/performed Overall Cognitive Status: No family/caregiver present to determine baseline cognitive functioning                           Safety/Judgement: Decreased awareness of safety     General Comments: Pt impulsive and acknowledges she is non-compliant      Exercises      General Comments        Pertinent Vitals/Pain Pain Assessment: No/denies pain    Home Living                      Prior Function            PT Goals (current goals can now be found in the care plan section) Progress towards PT goals: Progressing toward goals    Frequency    Min 3X/week      PT Plan Current plan remains appropriate  Co-evaluation              AM-PAC PT "6 Clicks" Mobility   Outcome Measure  Help needed turning from your back to your side while in a flat bed without using bedrails?: None Help needed moving from lying on your back to sitting on the side of a flat bed without using bedrails?: None Help needed moving to and from a bed to a chair (including a wheelchair)?: A Little Help needed standing up from a chair using your arms (e.g., wheelchair or bedside chair)?: A Little Help needed to walk in hospital room?: A Little Help needed climbing 3-5 steps with a railing? : A Lot 6 Click Score: 19    End  of Session Equipment Utilized During Treatment: Gait belt Activity Tolerance: Patient tolerated treatment well Patient left: with call bell/phone within reach;with chair alarm set;in chair   PT Visit Diagnosis: Other abnormalities of gait and mobility (R26.89);Difficulty in walking, not elsewhere classified (R26.2);Other symptoms and signs involving the nervous system (R29.898)     Time: 1630-1650 PT Time Calculation (min) (ACUTE ONLY): 20 min  Charges:  $Gait Training: 8-22 mins                     Abran Richard, PT Acute Rehab Services Pager [phone] [facility] [place] 11/30/2020, 6:00 PM

## 2020-12-01 LAB — GLUCOSE, CAPILLARY
Glucose-Capillary: 398 mg/dL — ABNORMAL HIGH (ref 70–99)
Glucose-Capillary: 261 mg/dL — ABNORMAL HIGH (ref 70–99)
Glucose-Capillary: 34 mg/dL — CL (ref 70–99)
Glucose-Capillary: 70 mg/dL (ref 70–99)
Glucose-Capillary: 113 mg/dL — ABNORMAL HIGH (ref 70–99)

## 2020-12-01 LAB — RENAL FUNCTION PANEL
Albumin: 2.6 g/dL — ABNORMAL LOW (ref 3.5–5.0)
Anion gap: 13 (ref 5–15)
BUN: 79 mg/dL — ABNORMAL HIGH (ref 6–20)
CO2: 22 mmol/L (ref 22–32)
Calcium: 9 mg/dL (ref 8.9–10.3)
Chloride: 94 mmol/L — ABNORMAL LOW (ref 98–111)
Creatinine, Ser: 8.34 mg/dL — ABNORMAL HIGH (ref 0.44–1.00)
GFR, Estimated: 5 mL/min — ABNORMAL LOW (ref >60)
Glucose, Bld: 332 mg/dL — ABNORMAL HIGH (ref 70–99)
Phosphorus: 2.9 mg/dL (ref 2.5–4.6)
Potassium: 5.1 mmol/L (ref 3.5–5.1)
Sodium: 129 mmol/L — ABNORMAL LOW (ref 135–145)

## 2020-12-01 MED ORDER — CALCIUM CARBONATE ANTACID 500 MG PO CHEW
CHEWABLE_TABLET | ORAL | Status: AC
  Administered 2020-12-01: 200 mg
  Filled 2020-12-01: qty 2

## 2020-12-01 MED ORDER — ACETAMINOPHEN 325 MG PO TABS: 650.0000 mg | ORAL_TABLET | Freq: Four times a day (QID) | ORAL | Status: DC | PRN

## 2020-12-01 MED ORDER — POLYVINYL ALCOHOL 1.4 % OP SOLN
1.0000 [drp] | OPHTHALMIC | Status: DC | PRN
  Filled 2020-12-01: qty 15

## 2020-12-01 MED ORDER — VANCOMYCIN HCL 750 MG/150ML IV SOLN: 750.0000 mg | INTRAVENOUS | Status: AC

## 2020-12-01 NOTE — Progress Notes (Signed)
Spoke with patient about her insulin pump. Has had this pump for 5 years. Is very familiar with how to use. States that she has all her supplies at home.   Reminded patient that she needs to wait until this evening around 7-8 pm to restart pump since receiving Semglee 15 units this morning. Her response was "they messed me up again". I reminded her that the nurse had orders to give the insulin this am probably before she knew that she was being discharged today.   Harvel Ricks RN BSN CDE Diabetes Coordinator Pager: [phone]  8am-5pm

## 2020-12-01 NOTE — Discharge Summary (Signed)
DANYELE SMEJKAL VXY:[id number] DOB: [birth year] DOA: 11/21/2020  PCP: Loyola Mast, PA-C  Admit date: 11/21/2020  Discharge date: 12/01/2020  Admitted From: Home  Disposition:  Home   Recommendations for Outpatient Follow-up:   Follow up with PCP in 1-2 weeks  PCP Please obtain BMP/CBC, 2 view CXR in 1week,  (see Discharge instructions)   PCP Please follow up on the following pending results: Monitor glycemic control closely, should follow close basis.  Monitor compliance with medications including insulin pump.   Home Health: PT, RN, Aide   Equipment/Devices: None  Consultations: Ophthalmology, ID, Renal, Podiatry Discharge Condition: Stable    CODE STATUS: Full    Diet Recommendation: Renal-low carbohydrate.  1.2 L fluid restriction per day   Chief Complaint  Patient presents with   Hyperglycemia     Brief history of present illness from the day of admission and additional interim summary    Brief Narrative: Patient is a 50 y.o. female with history of DM-1, diabetic retinopathy, chronic left foot osteomyelitis, ESRD on HD-recent history of MRSA bacteremia-presented with acute metabolic encephalopathy, DKA and left diabetic foot with osteomyelitis.  On 8/25-her encephalopathy finally improved-but was found to have bilateral visual loss with initial concern for seizure like activity on spot EEG-however no seizure noted on LTM EEG, but now felt to be due to BP/CBG fluctuation.  See below for further details.   Significant events: 7/14>> nausea/vomiting-left foot cellulitis-evaluated at urgent care in [place] on 7/14-blood cultures positive for MRSA.  Declined admission.  Subsequently received IV vancomycin with HD from 7/26-8/04 8/22>> presented to [facility] ED with confusion-found to have DKA 8/25>> finally  more awake and alert-claims she cannot see out of both eyes.  Spot EEG w discharge from left hemisphere-EEG improved with Ativan 2 mg x 1   Significant studies: 8/22>> x-ray left foot: Osteomyelitis at the second phalange 8/22>> CXR: No pneumonia 8/22>> CT head: No acute intracranial abnormality 8/24>> CT maxillofacial area: No acute infection 8/25>> EEG: Generalized periodic discharges from left hemisphere-given 2 mg of IV Ativan-EEG improved. 8/25>> Echo: EF 60-65%-no obvious vegetations. 8/25>> MRI brain: No acute intracranial pathology 8/25-8/26>> LTM EEG: Cortical dysfunction in the left posterior quadrant-nonspecific etiology-no seizures or definite epileptiform discharges seen.   Antimicrobial therapy: Vancomycin: 8/22>>12/19/20 Cefepime: 8/22>>8/23                                                                 Hospital Course   Acute metabolic encephalopathy: Multifactorial etiology-DKA/BUN elevation in setting of ESRD. Resolved.   Questionable seizures: Initial spot EEG was suspicious for possible seizures-however LTM EEG was negative for seizures.  Neurology recommending to hold off on starting antiepileptics.     DKA, history of DM 1 on insulin pump (A1c 8.1 on 8/3): DKA resolved with IV insulin.  Patient was transitioned to basal insulin.  Patient is much improved now and she is able to text without any difficulty using her cell phone, vision much better in the left eye, comfortable using her insulin pump again, we will have diabetic educator evaluate the patient locate her insulin pump and resume upon discharge as per home regimen.  Request PCP to monitor A1c and glycemic control closely in the outpatient setting.  Bilateral visual loss: No clear-cut etiology identified-no pathology evident on ocular exam by ophthalmologist.  Per my discussion with primary ophthalmologist on 8/25-patient has a longstanding history of diabetic retinopathy (20/70 on right and 20/50 on  left)-history of laser procedures/and eye injections every 8 weeks per primary ophthalmologist). No lesion seen on MRI brain.  Patient much improved prior to discharge especially in the left eye, she is able to text back and forth using her cell phone, right eye is still quite blurry will follow-up with her ophthalmologist outpatient was discharged.  Also previous MD had discussed with Dr. Mayford Knife MD on 8/26-suspicion that she may have had small infarcts in her bilateral optic nerves in the setting of blood pressure/CBG fluctuations in a background of significant diabetic retinopathy at baseline.  Doppler was recommended which does not show any significant stenosis. Vision seems to be improving. Post DC follow with Opth Dr Baird Cancer.    Sepsis due to acute osteomyelitis involving the left second phalanx-possibility of underlying MRSA infection: Sepsis physiology has improved.  Patient had positive blood cultures for MRSA in [place] on 7/14-it appears that she declined admission and apparently received treatment with IV vancomycin with hemodialysis from 7/26-8/4.  Evaluated by podiatry on 8/23-she appears to have more of a chronic osteomyelitis rather than a acute osteomyelitis. Blood cultures on 8/22 negative so far-transthoracic echo without any vegetations.  ID recommending 4 weeks of vancomycin with HD-end date of 12/19/2020. Seen by podiatry who did offer her surgical intervention however patient has declined.  Have communicated to nephrology that patient to get her IV vancomycin with outpatient HD sessions, informed Dr. Justin Mend.    Facial swelling: Etiology felt to be either due to volume overload-improved after fluid removal with HD on 8/24.  CT maxillofacial area does not show any acute infection.    Diarrhea: stable with supportive Rx.   ESRD (TTS): Nephrology following. On TTS schedule.   Normocytic anemia: Due to ESRD-defer need for EPO/IV iron to nephrology.   Hyperlipidemia: Can resume  statin at discharge.   Essential hypertension/hyponatremia: continue home Rx.   History of right transmetatarsal amputation - no acute issue.  Discharge diagnosis     Principal Problem:   MRSA bacteremia Active Problems:   Hypertension   Hypercholesteremia   Osteomyelitis (HCC)   Severe sepsis (HCC)   ESRD (end stage renal disease) on dialysis ([place])   DKA, type 1 ([place])   Acute metabolic encephalopathy   Hypertensive urgency   DKA (diabetic ketoacidosis) ([place])    Discharge instructions      Discharge Medications   Allergies as of 12/01/2020       Reactions   Cleocin [clindamycin Hcl] Diarrhea   Lisinopril Other (See Comments)   Elevated potassium per pt report   Amoxicillin Diarrhea, Nausea Only, Other (See Comments)   Did it involve swelling of the face/tongue/throat, SOB, or low BP? No Did it involve sudden or severe rash/hives, skin peeling, or any reaction on the inside of your mouth or nose? No Did you need to seek medical attention at a hospital or  doctor's office? No When did it last happen?      10 + years If all above answers are "NO", may proceed with cephalosporin use.   Bactrim [sulfamethoxazole-trimethoprim] Diarrhea, Nausea Only        Medication List     TAKE these medications    albuterol 108 (90 Base) MCG/ACT inhaler Commonly known as: VENTOLIN HFA Inhale 2 puffs into the lungs every 6 (six) hours as needed for wheezing or shortness of breath.   amLODipine 5 MG tablet Commonly known as: NORVASC Take 5 mg by mouth daily.   aspirin EC 81 MG tablet Take 81 mg by mouth daily.   B-12 5000 MCG Caps Take 5,000 mcg by mouth daily.   calcitRIOL 0.5 MCG capsule Commonly known as: ROCALTROL Take 1 capsule daily   calcium acetate 667 MG capsule Commonly known as: PHOSLO Take 2 capsules (1,334 mg total) by mouth 3 (three) times daily with meals.   Contour Next Test test strip Generic drug: glucose blood USE AS INSTRUCTED TO CHECK BLOOD  SUGAR 4 TIMES A DAY. DX E10.65   Dexcom G6 Receiver Devi 1 each by Does not apply route See admin instructions. Use Dexcom Receiver to monitor blood sugar continuously.   Dexcom G6 Sensor Misc 1 each by Does not apply route See admin instructions. Use one sensors once every 10 days to monitor blood sugars.   Dexcom G6 Transmitter Misc 1 each by Does not apply route See admin instructions. Use one transmitter once every 90 days.   DIALYVITE 800 WITH ZINC 0.8 MG Tabs Take 1 tablet by mouth daily.   diphenhydrAMINE 25 MG tablet Commonly known as: BENADRYL Take 25 mg by mouth daily as needed.   folic acid 671 MCG tablet Commonly known as: FOLVITE Take 400 mcg by mouth daily.   heparin 1000 unit/ml Soln injection Heparin Sodium (Porcine) 1,000 Units/mL Catheter Lock Arterial   insulin aspart 100 UNIT/ML injection Commonly known as: NovoLOG USE A MAX OF 65 UNITS DAILY VIA INSULIN PUMP   NovoLOG 100 UNIT/ML injection Generic drug: insulin aspart USE A MAX OF 65 UNITS DAILY VIA INSULIN PUMP   insulin pump Soln Inject into the skin. Insulin aspart (Novolog) 100 unit/ml---MAX 65 UNITS DAILY   lidocaine-prilocaine cream Commonly known as: EMLA Apply 1 application topically daily as needed. Port access   norethindrone 5 MG tablet Commonly known as: AYGESTIN Take 5 mg by mouth See admin instructions. 5mg  daily for 14 days, then off for 14 days, then repeat cycle.   Omega 3 500 500 MG Caps Take 500 mg by mouth daily.   ondansetron 4 MG disintegrating tablet Commonly known as: Zofran ODT Take 1 tablet (4 mg total) by mouth every 8 (eight) hours as needed for nausea or vomiting.   pravastatin 40 MG tablet Commonly known as: PRAVACHOL Take 1 tablet (40 mg total) by mouth daily.   vancomycin 750 MG/150ML Soln Commonly known as: VANCOREADY Inject 150 mLs (750 mg total) into the vein Every Tuesday,Thursday,and Saturday with dialysis for 18 days.         Follow-up  Information     Loyola Mast, PA-C. Schedule an appointment as soon as possible for a visit in 1 week(s).   Specialty: Physician Assistant Contact information: [address] [place]  [id number] [phone]         Sherlynn Stalls, MD. Schedule an appointment as soon as possible for a visit in 3 day(s).   Specialty: Ophthalmology Contact  information: [place] [address] [place], [facility] Follow up.   Specialty: Home Health Services Why: They will contact you about 48 hours after discharge to schedule first home health visit. Contact information: [address] [place] [address] [phone]                 Major procedures and Radiology Reports - PLEASE review detailed and final reports thoroughly  -       DG Abd 1 View  Result Date: 11/24/2020 CLINICAL DATA:  Evaluate for metallic foreign body EXAM: ABDOMEN - 1 VIEW COMPARISON:  None. FINDINGS: Scattered large and small bowel gas is noted. No abnormal mass or abnormal calcifications are seen. Belly button ring is noted. No bony abnormality is noted. Scattered vascular calcifications are noted. IMPRESSION: Umbilical ring is noted. No other focal abnormality is noted. Electronically Signed   By: Inez Catalina M.D.   On: 11/24/2020 03:45   CT HEAD WO CONTRAST (5MM)  Result Date: 11/21/2020 CLINICAL DATA:  Altered mental status. EXAM: CT HEAD WITHOUT CONTRAST TECHNIQUE: Contiguous axial images were obtained from the base of the skull through the vertex without intravenous contrast. COMPARISON:  None. FINDINGS: Motion artifact limits evaluation of the brain at the vertex. Brain: No evidence of acute infarction, hemorrhage, hydrocephalus, extra-axial collection or mass lesion/mass effect. Age advanced mild generalized cerebral atrophy. Vascular: Calcified atherosclerosis at the skullbase. No hyperdense vessel. Skull: Normal.  Negative for fracture or focal lesion. Sinuses/Orbits: Right greater than left maxillary sinus and ethmoid air cell mucosal thickening. No air-fluid levels. The mastoid air cells are clear. The orbits are unremarkable. Other: None. IMPRESSION: 1. Motion artifact limits evaluation of the brain at the vertex. No definite acute intracranial abnormality. 2. Age advanced mild generalized cerebral atrophy. Electronically Signed   By: Titus Dubin M.D.   On: 11/21/2020 11:14   MR BRAIN WO CONTRAST  Result Date: 11/24/2020 CLINICAL DATA:  Altered mental status, metabolic encephalopathy EXAM: MRI HEAD WITHOUT CONTRAST TECHNIQUE: Multiplanar, multiecho pulse sequences of the brain and surrounding structures were obtained without intravenous contrast. COMPARISON:  CT head 11/21/2020 FINDINGS: Brain: There is no evidence of acute intracranial hemorrhage, extra-axial fluid collection, or infarct. There is no focal parenchymal signal abnormality. There is mild global parenchymal volume loss. The ventricles are enlarged. No mass lesion is identified. There is no midline shift. Vascular: Normal flow voids. Skull and upper cervical spine: Normal marrow signal. Sinuses/Orbits: The paranasal sinuses are clear. Bilateral lens implants are in place. The bones and orbits are otherwise unremarkable. Other: There are bilateral mastoid effusions. IMPRESSION: 1. No acute intracranial pathology. 2. Bilateral mastoid effusions. Electronically Signed   By: Valetta Mole M.D.   On: 11/24/2020 11:52   CT MAXILLOFACIAL W CONTRAST  Result Date: 11/23/2020 CLINICAL DATA:  Facial cellulitis EXAM: CT MAXILLOFACIAL WITH CONTRAST TECHNIQUE: Multidetector CT imaging of the maxillofacial structures was performed with intravenous contrast. Multiplanar CT image reconstructions were also generated. CONTRAST:  67mL OMNIPAQUE IOHEXOL 300 MG/ML  SOLN COMPARISON:  CT head 11/21/2020 FINDINGS: Osseous: Periapical lucency around left lower molar  compatible with infection which may be chronic. No fracture. Orbits: Bilateral cataract extraction.  No orbital mass or edema. Sinuses: Mucosal edema right maxillary sinus. Remaining sinuses clear. Mastoid clear bilaterally. Soft tissues: Mild soft tissue edema in the subcutaneous tissue of the anterior neck and lateral to the mandible bilaterally. No soft tissue abscess identified. Limited  intracranial: No acute abnormality IMPRESSION: Mild soft tissue swelling in the anterior neck bilaterally, nonspecific in appearance. Possible edema or cellulitis. No soft tissue abscess. Periapical lucency around left lower molar consistent with dental infection Electronically Signed   By: Franchot Gallo M.D.   On: 11/23/2020 17:14   DG Chest Port 1 View  Result Date: 11/21/2020 CLINICAL DATA:  50 year old female with possible sepsis. EXAM: PORTABLE CHEST 1 VIEW COMPARISON:  Chest x-ray 08/06/2018. FINDINGS: Lung volumes are normal. No consolidative airspace disease. No pleural effusions. No pneumothorax. No definite suspicious appearing pulmonary nodules or masses are noted. Cephalization of the pulmonary vasculature, without frank pulmonary edema. Heart size is mildly enlarged. Upper mediastinal contours are within normal limits. Vascular stent projecting over the left axillary region. IMPRESSION: 1. Cardiomegaly with pulmonary venous congestion, but no frank pulmonary edema. Electronically Signed   By: Vinnie Langton M.D.   On: 11/21/2020 06:20   DG Foot Complete Left  Result Date: 11/21/2020 CLINICAL DATA:  Chronic infection. EXAM: LEFT FOOT - COMPLETE 3+ VIEW COMPARISON:  01/09/2019 FINDINGS: Generalized osteopenia. 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 There is soft tissue swelling with extensive bony erosion involving the distal second digit. The head of the proximal phalanx is eroded and the other phalanges are barely visible. No opaque foreign body. Similar mildly swollen  appearance of the great toe stump. 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 Electronically Signed   By: Monte Fantasia M.D.   On: 11/21/2020 06:21   DG Foot Complete Right  Result Date: 11/22/2020 CLINICAL DATA:  Wound, discharge EXAM: RIGHT FOOT COMPLETE - 3+ VIEW COMPARISON:  07/04/2020 FINDINGS: Frontal, oblique, lateral views of the right foot are obtained. 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 Extensive degenerative changes are seen throughout the hindfoot, unchanged since prior exam. There are no acute displaced fractures. The bones are diffusely osteopenic. No destructive changes or periosteal reaction to suggest osteomyelitis. There is mild diffuse soft tissue edema, most pronounced along the plantar medial aspect at the amputation site. No evidence of subcutaneous gas or radiopaque foreign body. Overlying bandage material is seen along the medial right foot. IMPRESSION: 1. Extensive postsurgical and degenerative changes as above, stable since previous study. 2. No acute or destructive bony lesions. 3. Prominent soft tissue swelling along the plantar medial aspect of the right foot. Electronically Signed   By: Randa Ngo M.D.   On: 11/22/2020 15:14   Adult EEG prolonged 41-60 minutes  Result Date: 11/24/2020 Lora Havens, MD     11/24/2020  3:14 PM Patient Name: TAKEYAH WIEMAN MRN: [id number] Epilepsy Attending: Lora Havens Referring Physician/Provider: Dr Oren Binet Date: 11/24/2020 Duration: 43.03 mins Patient history: 50 year old female admitted with DKA, MRSA bacteremia and had sudden worsening of vision.  EEG to evaluate for seizures. Level of alertness: Awake, asleep AEDs during EEG study: None Technical aspects: This EEG study was done with scalp electrodes positioned according to the 10-20 International system of electrode placement. Electrical activity was acquired at a sampling rate of 500Hz  and reviewed with a  high frequency filter of 70Hz  and a low frequency filter of 1Hz . EEG data were recorded continuously and digitally stored. Description: During awake state, no clear posterior dominant rhythm was seen.  EEG showed continuous 3 to 5 Hz theta-delta slowing.  There was also generalized and maximal left hemisphere periodic epileptiform discharges with triphasic morphology at 1.5 Hz which at times  appeared rhythmic without definite evolution.  IV Ativan was administered on 11/24/2020 at 0 932 after which EEG showed sleep spindles (12 to 14 Hz), maximal frontocentral region as well as intermittent generalized 3 to 5 Hz theta and delta slowing.  Hyperventilation and photic stimulation were not performed.   ABNORMALITY - Periodic discharges with triphasic morphology, generalized and lateralized left hemisphere ( GPDs) - Continuous slow, generalized IMPRESSION: This study initially showed generalized periodic discharges with triphasic morphology at 1.5 Hz which at times appeared rhythmic.  This EEG pattern is on the ictal and interictal continuum and can also be seen due to toxic-metabolic causes.  IV Ativan 2 mg was administered after which patient fell asleep and EEG pattern improved.  Improvement with benzodiazepine is not necessarily diagnostic of ictal nature.  Therefore, would recommend prolonged EEG monitoring for further evaluation of this EEG pattern. Lora Havens    ECHOCARDIOGRAM COMPLETE  Result Date: 11/24/2020    ECHOCARDIOGRAM REPORT   Patient Name:   TANAY MASSIAH Date of Exam: 11/24/2020 Medical Rec #:  [id number]          Height:       67.0 in Accession #:    [id number]         Weight:       127.0 lb Date of Birth:  [birth year]          BSA:          1.667 m Patient Age:    20 years           BP:           150/70 mmHg Patient Gender: F                  HR:           100 bpm. Exam Location:  Inpatient Procedure: 2D Echo, Cardiac Doppler and Color Doppler Indications:    Bacteremia  History:         Patient has prior history of Echocardiogram examinations, most                 recent 08/06/2018. Risk Factors:Hypertension.  Sonographer:    Bernadene Person RDCS Referring Phys: [id number] [place]  1. Left ventricular ejection fraction, by estimation, is 60 to 65%. The left ventricle has normal function. The left ventricle has no regional wall motion abnormalities. Left ventricular diastolic parameters were normal.  2. Right ventricular systolic function is normal. The right ventricular size is normal. There is mildly elevated pulmonary artery systolic pressure.  3. The mitral valve is normal in structure. No evidence of mitral valve regurgitation. No evidence of mitral stenosis.  4. The aortic valve is tricuspid. Aortic valve regurgitation is not visualized. No aortic stenosis is present.  5. The inferior vena cava is normal in size with greater than 50% respiratory variability, suggesting right atrial pressure of 3 mmHg. FINDINGS  Left Ventricle: Left ventricular ejection fraction, by estimation, is 60 to 65%. The left ventricle has normal function. The left ventricle has no regional wall motion abnormalities. The left ventricular internal cavity size was normal in size. There is  no left ventricular hypertrophy. Left ventricular diastolic parameters were normal. Right Ventricle: The right ventricular size is normal. Right ventricular systolic function is normal. There is mildly elevated pulmonary artery systolic pressure. The tricuspid regurgitant velocity is 2.99 m/s, and with an assumed right atrial pressure of 3 mmHg, the estimated right ventricular systolic pressure is 62.5 mmHg.  Left Atrium: Left atrial size was normal in size. Right Atrium: Right atrial size was normal in size. Pericardium: Trivial pericardial effusion is present. Mitral Valve: The mitral valve is normal in structure. Mild mitral annular calcification. No evidence of mitral valve regurgitation. No evidence of mitral valve  stenosis. Tricuspid Valve: The tricuspid valve is normal in structure. Tricuspid valve regurgitation is mild . No evidence of tricuspid stenosis. Aortic Valve: The aortic valve is tricuspid. Aortic valve regurgitation is not visualized. No aortic stenosis is present. Pulmonic Valve: The pulmonic valve was normal in structure. Pulmonic valve regurgitation is not visualized. No evidence of pulmonic stenosis. Aorta: The aortic root is normal in size and structure. Venous: The inferior vena cava is normal in size with greater than 50% respiratory variability, suggesting right atrial pressure of 3 mmHg. IAS/Shunts: No atrial level shunt detected by color flow Doppler.  LEFT VENTRICLE PLAX 2D LVIDd:         4.30 cm  Diastology LVIDs:         2.70 cm  LV e' medial:  8.00 cm/s LV PW:         1.10 cm  LV e' lateral: 9.74 cm/s LV IVS:        1.00 cm LVOT diam:     1.60 cm LV SV:         51 LV SV Index:   30 LVOT Area:     2.01 cm  RIGHT VENTRICLE RV S prime:     12.00 cm/s TAPSE (M-mode): 2.3 cm LEFT ATRIUM             Index       RIGHT ATRIUM           Index LA diam:        3.30 cm 1.98 cm/m  RA Area:     11.30 cm LA Vol (A2C):   35.8 ml 21.47 ml/m RA Volume:   23.30 ml  13.98 ml/m LA Vol (A4C):   39.3 ml 23.57 ml/m LA Biplane Vol: 38.1 ml 22.85 ml/m  AORTIC VALVE LVOT Vmax:   137.00 cm/s LVOT Vmean:  92.600 cm/s LVOT VTI:    0.252 m  AORTA Ao Root diam: 2.90 cm Ao Asc diam:  3.30 cm TRICUSPID VALVE TR Peak grad:   35.8 mmHg TR Vmax:        299.00 cm/s  SHUNTS Systemic VTI:  0.25 m Systemic Diam: 1.60 cm Kirk Ruths MD Electronically signed by Kirk Ruths MD Signature Date/Time: 11/24/2020/3:20:12 PM    Final    VAS US CAROTID  Result Date: 11/27/2020 Carotid Arterial Duplex Study Patient Name:  KARELI HOSSAIN  Date of Exam:   11/27/2020 Medical Rec #: [id number]           Accession #:    [id number] Date of Birth: [birth year]           Patient Gender: F Patient Age:   12 years Exam Location:  [facility] Procedure:      VAS US CAROTID Referring Phys: Lesleigh Noe --------------------------------------------------------------------------------  Indications:      Visual disturbance. Risk Factors:     Hypertension, hyperlipidemia, Diabetes, past history of                   smoking. Comparison Study: No prior studies. Performing Technologist: Darlin Coco RDMS, RVT  Examination Guidelines: A complete evaluation includes B-mode imaging, spectral Doppler, color Doppler, and power Doppler as needed of all accessible portions  of each vessel. Bilateral testing is considered an integral part of a complete examination. Limited examinations for reoccurring indications may be performed as noted.  Right Carotid Findings: +----------+--------+--------+--------+------------------+--------+           PSV cm/sEDV cm/sStenosisPlaque DescriptionComments +----------+--------+--------+--------+------------------+--------+ CCA Prox  77      11                                         +----------+--------+--------+--------+------------------+--------+ CCA Distal72      18                                         +----------+--------+--------+--------+------------------+--------+ ICA Prox  47      14      1-39%   heterogenous               +----------+--------+--------+--------+------------------+--------+ ICA Distal65      21                                         +----------+--------+--------+--------+------------------+--------+ ECA       83      11                                         +----------+--------+--------+--------+------------------+--------+ +----------+--------+-------+----------------+-------------------+           PSV cm/sEDV cmsDescribe        Arm Pressure (mmHG) +----------+--------+-------+----------------+-------------------+ [id number]             Multiphasic, WNL                    +----------+--------+-------+----------------+-------------------+  +---------+--------+--+--------+--+---------+ VertebralPSV cm/s40EDV cm/s12Antegrade +---------+--------+--+--------+--+---------+  Left Carotid Findings: +----------+--------+--------+--------+-------------------------+--------+           PSV cm/sEDV cm/sStenosisPlaque Description       Comments +----------+--------+--------+--------+-------------------------+--------+ CCA Prox  88      14                                                +----------+--------+--------+--------+-------------------------+--------+ CCA Distal63      14                                                +----------+--------+--------+--------+-------------------------+--------+ ICA Prox  89      27      1-39%   heterogenous and calcific         +----------+--------+--------+--------+-------------------------+--------+ ICA Distal104     32                                                +----------+--------+--------+--------+-------------------------+--------+ ECA       91      9                                                 +----------+--------+--------+--------+-------------------------+--------+ +----------+--------+--------+----------------+-------------------+  PSV cm/sEDV cm/sDescribe        Arm Pressure (mmHG) +----------+--------+--------+----------------+-------------------+ [id number]             Multiphasic, WNL                    +----------+--------+--------+----------------+-------------------+ +---------+--------+--+--------+--+---------+ VertebralPSV cm/s50EDV cm/s13Antegrade +---------+--------+--+--------+--+---------+   Summary: Right Carotid: Velocities in the right ICA are consistent with a 1-39% stenosis. Left Carotid: Velocities in the left ICA are consistent with a 1-39% stenosis. Vertebrals:  Bilateral vertebral arteries demonstrate antegrade flow. Subclavians: Normal flow hemodynamics were seen in bilateral subclavian               arteries. *See table(s) above for measurements and observations.  Electronically signed by Monica Martinez MD on 11/27/2020 at 2:26:14 PM.    Final     Micro Results     Recent Results (from the past 240 hour(s))  Culture, blood (single)     Status: None   Collection Time: 11/21/20  3:45 PM   Specimen: BLOOD RIGHT HAND  Result Value Ref Range Status   Specimen Description BLOOD RIGHT HAND  Final   Special Requests IN PEDIATRIC BOTTLE Blood Culture adequate volume  Final   Culture   Final    NO GROWTH 5 DAYS Performed at [facility], [address] [place], [place] [address]    Report Status 11/26/2020 FINAL  Final  MRSA Next Gen by PCR, Nasal     Status: Abnormal   Collection Time: 11/22/20 10:57 AM   Specimen: Nasal Mucosa; Nasal Swab  Result Value Ref Range Status   MRSA by PCR Next Gen DETECTED (A) NOT DETECTED Final    Comment: RESULT CALLED TO, READ BACK BY AND VERIFIED WITH: Maryan Puls RN 11/22/2020 @1417  BY JW (NOTE) The GeneXpert MRSA Assay (FDA approved for NASAL specimens only), is one component of a comprehensive MRSA colonization surveillance program. It is not intended to diagnose MRSA infection nor to guide or monitor treatment for MRSA infections. Test performance is not FDA approved in patients less than 26 years old. Performed at [facility], [place] [address], [place], [place] [address]   C Difficile Quick Screen w PCR reflex     Status: None   Collection Time: 11/22/20  4:33 PM   Specimen: STOOL  Result Value Ref Range Status   C Diff antigen NEGATIVE NEGATIVE Final   C Diff toxin NEGATIVE NEGATIVE Final   C Diff interpretation No C. difficile detected.  Final    Comment: Performed at [facility], [place] [address], [place], [place] [address]  Gastrointestinal Panel by PCR , Stool     Status: None   Collection Time: 11/22/20  4:33 PM   Specimen: STOOL  Result Value Ref Range Status   Campylobacter species NOT DETECTED NOT DETECTED  Final   Plesimonas shigelloides NOT DETECTED NOT DETECTED Final   Salmonella species NOT DETECTED NOT DETECTED Final   Yersinia enterocolitica NOT DETECTED NOT DETECTED Final   Vibrio species NOT DETECTED NOT DETECTED Final   Vibrio cholerae NOT DETECTED NOT DETECTED Final   Enteroaggregative E coli (EAEC) NOT DETECTED NOT DETECTED Final   Enteropathogenic E coli (EPEC) NOT DETECTED NOT DETECTED Final   Enterotoxigenic E coli (ETEC) NOT DETECTED NOT DETECTED Final   Shiga like toxin producing E coli (STEC) NOT DETECTED NOT DETECTED Final   Shigella/Enteroinvasive E coli (EIEC) NOT DETECTED NOT DETECTED Final   Cryptosporidium NOT DETECTED NOT DETECTED Final  Cyclospora cayetanensis NOT DETECTED NOT DETECTED Final   Entamoeba histolytica NOT DETECTED NOT DETECTED Final   Giardia lamblia NOT DETECTED NOT DETECTED Final   Adenovirus F40/41 NOT DETECTED NOT DETECTED Final   Astrovirus NOT DETECTED NOT DETECTED Final   Norovirus GI/GII NOT DETECTED NOT DETECTED Final   Rotavirus A NOT DETECTED NOT DETECTED Final   Sapovirus (I, II, IV, and V) NOT DETECTED NOT DETECTED Final    Comment: Performed at [facility], [address] [place], [place] [address]  Culture, blood (routine x 2)     Status: None   Collection Time: 11/23/20  3:26 PM   Specimen: BLOOD RIGHT HAND  Result Value Ref Range Status   Specimen Description BLOOD RIGHT HAND  Final   Special Requests   Final    BOTTLES DRAWN AEROBIC AND ANAEROBIC Blood Culture results may not be optimal due to an excessive volume of blood received in culture bottles   Culture   Final    NO GROWTH 5 DAYS Performed at [facility] [address] [place], [place] [address]    Report Status 11/28/2020 FINAL  Final  Culture, blood (routine x 2)     Status: None   Collection Time: 11/23/20  3:27 PM   Specimen: BLOOD RIGHT HAND  Result Value Ref Range Status   Specimen Description BLOOD RIGHT HAND  Final   Special Requests    Final    BOTTLES DRAWN AEROBIC AND ANAEROBIC Blood Culture results may not be optimal due to an excessive volume of blood received in culture bottles   Culture   Final    NO GROWTH 5 DAYS Performed at [facility], [place] [address] [place], [place] [address]    Report Status 11/28/2020 FINAL  Final    Today   Subjective    Dinara Lupu today has no headache,no chest abdominal pain,no new weakness tingling or numbness, feels much better wants to go home today.   Objective   Blood pressure (!) 178/86, pulse 86, temperature 97.6 F (36.4 C), temperature source Oral, resp. rate 18, height 5\' 7"  (1.702 m), weight 58.6 kg, SpO2 95 %.   Intake/Output Summary (Last 24 hours) at 12/01/2020 1008 Last data filed at 11/30/2020 2230 Gross per 24 hour  Intake 420 ml  Output --  Net 420 ml    Exam  Awake Alert, No new F.N deficits, Normal affect [place].AT,PERRAL Supple Neck,No JVD, No cervical lymphadenopathy appriciated.  Symmetrical Chest wall movement, Good air movement bilaterally, CTAB RRR,No Gallops,Rubs or new Murmurs, No Parasternal Heave +ve B.Sounds, Abd Soft, Non tender, No organomegaly appriciated, No rebound -guarding or rigidity. S/p Right TMA, Left foot with chronic ulcer   Data Review   CBC w Diff:  Lab Results  Component Value Date   WBC 5.8 11/30/2020   HGB 9.1 (L) 11/30/2020   HGB 10.2 (L) 12/18/2016   HCT 27.7 (L) 11/30/2020   HCT 23.2 (L) 07/30/2018   PLT 280 11/30/2020   PLT 627 (H) 12/18/2016   LYMPHOPCT 13 11/21/2020   BANDSPCT 8 03/25/2013   MONOPCT 9 11/21/2020   EOSPCT 1 11/21/2020   BASOPCT 0 11/21/2020    CMP:  Lab Results  Component Value Date   NA 129 (L) 12/01/2020   K 5.1 12/01/2020   CL 94 (L) 12/01/2020   CO2 22 12/01/2020   BUN 79 (H) 12/01/2020   CREATININE 8.34 (H) 12/01/2020   CREATININE 3.87 (H) 11/11/2017   PROT  6.6 11/23/2020   ALBUMIN 2.6 (L) 12/01/2020   BILITOT 0.9 11/23/2020   ALKPHOS 156 (H) 11/23/2020   AST 26  11/23/2020   ALT 32 11/23/2020  . Lab Results  Component Value Date   HGBA1C 8.1 (A) 11/02/2020     Total Time in preparing paper work, data evaluation and todays exam - 79 minutes  Lala Lund M.D on 12/01/2020 at 10:08 AM  [facility]

## 2020-12-01 NOTE — Progress Notes (Signed)
Pt returned from hemodialysis this evening very drowsy. CBG was 34, 1 amp D50 given IV && Dr. Candiss Norse was made aware. Stated to recheck pt's @ 1800 & if sugar >75 she could go home. Pt ate dinner & last sugar was 113. Pt was discharged home & picked up by sister.

## 2020-12-01 NOTE — Progress Notes (Signed)
[facility] Progress Note   Subjective: Home today with [place] post HD today.  Objective Vitals:   12/01/20 0009 12/01/20 0414 12/01/20 0527 12/01/20 0824  BP: (!) 160/75 (!) 145/76  (!) 178/86  Pulse: 86   86  Resp: 16 18 (P) 14 18  Temp: 98.4 F (36.9 C) 97.7 F (36.5 C)  97.6 F (36.4 C)  TempSrc: Oral Oral  Oral  SpO2: 95% 96%  95%  Weight:      Height:       Physical Exam General: Alert, NAD Heart: S1,S2 RRR No M/R/G Lungs: CTAB Abdomen: NABS Extremities: No LE edema. L foot drsg intact Dialysis Access: L AVF + T/B   Additional Objective Labs: Basic Metabolic Panel: Recent Labs  Lab 11/29/20 0051 11/30/20 0130 12/01/20 0442  NA 129* 131* 129*  K 5.1 4.1 5.1  CL 93* 94* 94*  CO2 22 26 22   GLUCOSE 165* 330* 332*  BUN 84* 48* 79*  CREATININE 10.52* 6.79* 8.34*  CALCIUM 8.6* 8.3* 9.0  PHOS 4.6 3.5 2.9   Liver Function Tests: Recent Labs  Lab 11/29/20 0051 11/30/20 0130 12/01/20 0442  ALBUMIN 2.8* 2.5* 2.6*   No results for input(s): LIPASE, AMYLASE in the last 168 hours. CBC: Recent Labs  Lab 11/26/20 0116 11/27/20 0205 11/28/20 0150 11/29/20 0051 11/30/20 0130  WBC 6.5 7.3 6.4 8.0 5.8  HGB 11.1* 10.8* 10.3* 9.8* 9.1*  HCT 33.7* 32.0* 31.4* 29.4* 27.7*  MCV 90.1 88.6 89.0 89.1 92.0  PLT 297 301 321 340 280   Blood Culture    Component Value Date/Time   SDES BLOOD RIGHT HAND 11/23/2020 1527   SPECREQUEST  11/23/2020 1527    BOTTLES DRAWN AEROBIC AND ANAEROBIC Blood Culture results may not be optimal due to an excessive volume of blood received in culture bottles   CULT  11/23/2020 1527    NO GROWTH 5 DAYS Performed at [facility], [place] [address], [place], [place] [address]    REPTSTATUS 11/28/2020 FINAL 11/23/2020 1527    Cardiac Enzymes: No results for input(s): CKTOTAL, CKMB, CKMBINDEX, TROPONINI in the last 168 hours. CBG: Recent Labs  Lab 11/30/20 0735 11/30/20 1114 11/30/20 1623 11/30/20 2240  12/01/20 0734  GLUCAP 373* 94 132* 178* 398*   Iron Studies: No results for input(s): IRON, TIBC, TRANSFERRIN, FERRITIN in the last 72 hours. @lablastinr3 @ Studies/Results: No results found. Medications:  vancomycin 750 mg (11/29/20 1622)    amLODipine  10 mg Oral Daily   aspirin EC  81 mg Oral Daily   calcium acetate  1,334 mg Oral TID WC   Chlorhexidine Gluconate Cloth  6 each Topical Q0600   collagenase   Topical Daily   darbepoetin (ARANESP) injection - DIALYSIS  25 mcg Intravenous Q Tue-HD   doxercalciferol  9 mcg Intravenous Q T,Th,Sa-HD   insulin aspart  0-15 Units Subcutaneous TID WC   insulin aspart  0-5 Units Subcutaneous QHS   insulin aspart  3 Units Subcutaneous TID WC   insulin glargine-yfgn  15 Units Subcutaneous Daily   loratadine  10 mg Oral Daily   mupirocin ointment   Nasal BID   omega-3 acid ethyl esters  1 g Oral Daily   pravastatin  40 mg Oral q1800     4h  56kg  2/2 bath Hep none LUA AVF   - hect 9 ug IV tiw     Assessment/Plan DKA / type 1 DM -  stabilizing, plans per admit. Please do not  use LR or NS fluids without renal service approval.  OK to use straight D5W or D10W for BS support as needed.  Hyponatremia/ hyperkalemia - Na 131 K 4.1 today.  improved  with Glucose control and HD ESRD - on HD TTS. Next HD 12/01/2020 BP/ volume - BP's variable,  Amlodipine 10 mg daily started, on labetalol and hydralazine IV prn. Net UF 1250  Does not appear grossly volume overloaded. UF as tolerated Visual loss-Now resolved. Seen by ophthalmology/neuro, thought to be due to glucose/BP fluctuations.  Plan for further evaluation as OP. AMS - resolved,OX 3. Thought to be due to metabolic dearrangements L foot osteomyelitis -Plan for 4 weeks vancomycin IV with HD with end date 12/19/20.  ID signed off.  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  Phos  5.8>4.6 Restarted binders.  Continue   Khrystina Bonnes H. Litha Lamartina NP-C 12/01/2020, 10:55 AM  [employer] [phone]

## 2020-12-01 NOTE — TOC Progression Note (Signed)
Transition of Care [facility]) - Progression Note    Patient Details  Name: [name] MRN: [id number] Date of Birth: [birth year]  [facility] CM/SW Contact  Loletha Grayer Beverely Pace, RN Phone Number: 12/01/2020, 2:49 PM  Clinical Narrative:  Case manager was notified that patient will discharge after Dialysis today. Per HD staff patient will come of the machine at 4:30pm. CM has completed Medical Necessity form and spoken to bedside RN. Number for after hours PTAR transport: [phone]. Home Health has been arranged with [facility].  Expected Discharge Plan: [place] Barriers to Discharge: Continued Medical Work up  Expected Discharge Plan and Services Expected Discharge Plan: [place] In-house Referral: Clinical Social Work Discharge Planning Services: CM Consult Post Acute Care Choice: [place] arrangements for the past 2 months: Apartment Expected Discharge Date: 12/01/20                         HH Arranged: PT, OT, Nurse's Aide           Social Determinants of Health (SDOH) Interventions    Readmission Risk Interventions Readmission Risk Prevention Plan 11/30/2020 08/09/2018  Transportation Screening Complete Complete  PCP or Specialist Appt within 5-7 Days - Complete  Home Care Screening - Complete  Medication Review (RN CM) - Complete  Medication Review (RN Transport planner) Referral to Pharmacy -  PCP or Specialist appointment within 3-5 days of discharge Complete -  [place] or Home Care Consult Complete -  SW Recovery Care/Counseling Consult Complete -  [place] Patient Refused -  Some recent data might be hidden

## 2020-12-01 NOTE — Discharge Instructions (Signed)
Follow with Primary MD Loyola Mast, PA-C and your ophthalmologist in 7 days   Get CBC, CMP, 2 view Chest X ray -  checked next visit within 1 week by Primary MD   Activity: As tolerated with Full fall precautions use walker/cane & assistance as needed  Disposition Home    Diet: Renal-low carbohydrate diet, 1.2 L fluid restriction per day.  Monitor your Insulin pump and sugar levels as per your home routine before.  Special Instructions: If you have smoked or chewed Tobacco  in the last 2 yrs please stop smoking, stop any regular Alcohol  and or any Recreational drug use.  On your next visit with your primary care physician please Get Medicines reviewed and adjusted.  Please request your Prim.MD to go over all Hospital Tests and Procedure/Radiological results at the follow up, please get all Hospital records sent to your Prim MD by signing hospital release before you go home.  If you experience worsening of your admission symptoms, develop shortness of breath, life threatening emergency, suicidal or homicidal thoughts you must seek medical attention immediately by calling 911 or calling your MD immediately  if symptoms less severe.  You Must read complete instructions/literature along with all the possible adverse reactions/side effects for all the Medicines you take and that have been prescribed to you. Take any new Medicines after you have completely understood and accpet all the possible adverse reactions/side effects.   Do not drive until you have been cleared to drive by your ophthalmologist

## 2020-12-01 NOTE — Progress Notes (Signed)
SW clinic notified of pt's d/c today and pt will return needing 4 weeks of iv abx.   Melven Sartorius  Renal Navigator [phone]

## 2020-12-02 ENCOUNTER — Other Ambulatory Visit: Payer: Self-pay

## 2020-12-02 ENCOUNTER — Telehealth: Payer: Self-pay | Admitting: Dietician

## 2020-12-02 ENCOUNTER — Telehealth: Payer: Self-pay | Admitting: Endocrinology

## 2020-12-02 DIAGNOSIS — E1051 Type 1 diabetes mellitus with diabetic peripheral angiopathy without gangrene: Secondary | ICD-10-CM

## 2020-12-02 MED ORDER — INSULIN ASPART 100 UNIT/ML IJ SOLN: 16.0000 [IU] | Freq: Once | INTRAMUSCULAR | 3 refills | Status: DC

## 2020-12-02 MED ORDER — INSULIN ASPART 100 UNIT/ML IJ SOLN: INTRAMUSCULAR | 3 refills | Status: DC

## 2020-12-02 NOTE — Telephone Encounter (Signed)
Sent to refills to pharmacy not able to send 90 day supply because the max 250 per month.per insurance.

## 2020-12-02 NOTE — Telephone Encounter (Signed)
Returned patient call. Patient reports recent 11 day hospitalization due to blood glucose control.  She states that this is due to pump problems and would like an appointment with the educator.  Also, informed patient that her insulin prescription was called in and that insurance limits this to a 1 month supply.  Patient reported that it is too difficult to pick up her insulin monthly.  Patient to discuss with Vaughan Basta.  Alois Cliche to fit patient into her schedule.  Antonieta Iba, RD, LDN, CDCES

## 2020-12-02 NOTE — Telephone Encounter (Signed)
Pt is wondering if she can get a 3 month of supply at one time for her insulin.   insulin aspart (NOVOLOG) 100 UNIT/ML injection   [id number] - [place], [place] - [address]

## 2020-12-09 ENCOUNTER — Encounter (HOSPITAL_BASED_OUTPATIENT_CLINIC_OR_DEPARTMENT_OTHER): Payer: Medicare HMO | Admitting: Internal Medicine

## 2020-12-14 ENCOUNTER — Other Ambulatory Visit: Payer: Self-pay

## 2020-12-14 ENCOUNTER — Encounter (HOSPITAL_BASED_OUTPATIENT_CLINIC_OR_DEPARTMENT_OTHER): Payer: Medicare HMO | Attending: Internal Medicine | Admitting: Internal Medicine

## 2020-12-14 DIAGNOSIS — E1069 Type 1 diabetes mellitus with other specified complication: Secondary | ICD-10-CM | POA: Diagnosis not present

## 2020-12-14 DIAGNOSIS — L97529 Non-pressure chronic ulcer of other part of left foot with unspecified severity: Secondary | ICD-10-CM | POA: Diagnosis not present

## 2020-12-14 DIAGNOSIS — L97514 Non-pressure chronic ulcer of other part of right foot with necrosis of bone: Secondary | ICD-10-CM | POA: Insufficient documentation

## 2020-12-14 DIAGNOSIS — Z9119 Patient's noncompliance with other medical treatment and regimen: Secondary | ICD-10-CM | POA: Insufficient documentation

## 2020-12-14 DIAGNOSIS — E1042 Type 1 diabetes mellitus with diabetic polyneuropathy: Secondary | ICD-10-CM | POA: Insufficient documentation

## 2020-12-14 DIAGNOSIS — M86172 Other acute osteomyelitis, left ankle and foot: Secondary | ICD-10-CM | POA: Diagnosis not present

## 2020-12-14 DIAGNOSIS — E10621 Type 1 diabetes mellitus with foot ulcer: Secondary | ICD-10-CM | POA: Insufficient documentation

## 2020-12-14 DIAGNOSIS — Z87891 Personal history of nicotine dependence: Secondary | ICD-10-CM | POA: Insufficient documentation

## 2020-12-14 DIAGNOSIS — Z9641 Presence of insulin pump (external) (internal): Secondary | ICD-10-CM | POA: Insufficient documentation

## 2020-12-14 DIAGNOSIS — Z89512 Acquired absence of left leg below knee: Secondary | ICD-10-CM | POA: Insufficient documentation

## 2020-12-14 DIAGNOSIS — L97519 Non-pressure chronic ulcer of other part of right foot with unspecified severity: Secondary | ICD-10-CM | POA: Diagnosis present

## 2020-12-15 NOTE — Progress Notes (Signed)
ZYLAH, ELSBERND ([id number]) Visit Report for 12/14/2020 Arrival Information Details Patient Name: Date of Service: [name] 12/14/2020 8:00 A M Medical Record Number: [id number] Patient Account Number: [id number] Date of Birth/Sex: Treating RN: 1970/10/10 (50 y.o. [name], [name] Primary Care Jahseh Lucchese: [name], [name] Other Clinician: Referring Zayde Stroupe: Treating Maleke Feria/Extender: [name], [name] Weeks in Treatment: 50 Visit Information History Since Last Visit Added or deleted any medications: No Patient Arrived: Wheel Chair Any new allergies or adverse reactions: No Arrival Time: 08:09 Had a fall or experienced change in No Accompanied By: self activities of daily living that may affect Transfer Assistance: None risk of falls: Patient Identification Verified: Yes Signs or symptoms of abuse/neglect since last visito No Secondary Verification Process Completed: Yes Hospitalized since last visit: No Patient Requires Transmission-Based Precautions: No Implantable device outside of the clinic excluding No Patient Has Alerts: No cellular tissue based products placed in the center since last visit: Has Dressing in Place as Prescribed: Yes Pain Present Now: No Electronic Signature(s) Signed: 12/15/2020 4:34:42 PM By: Rhae Hammock RN Entered By: Rhae Hammock on 12/14/2020 08:09:28 -------------------------------------------------------------------------------- Encounter Discharge Information Details Patient Name: Date of Service: [place], Karn Cassis [name] 12/14/2020 8:00 A M Medical Record Number: [id number] Patient Account Number: [id number] Date of Birth/Sex: Treating RN: 03/02/1971 (50 y.o. [name], [name] Primary Care Makell Cyr: [name], [name] Other Clinician: Referring Zanobia Griebel: Treating Curlie Macken/Extender: [name], [name] Weeks in Treatment: 119 Encounter Discharge Information Items Post Procedure  Vitals Discharge Condition: Stable Temperature (F): 98.7 Ambulatory Status: Wheelchair Pulse (bpm): 74 Discharge Destination: Home Respiratory Rate (breaths/min): 17 Transportation: Private Auto Blood Pressure (mmHg): 134/74 Accompanied By: self Schedule Follow-up Appointment: Yes Clinical Summary of Care: Patient Declined Electronic Signature(s) Signed: 12/15/2020 4:34:42 PM By: Rhae Hammock RN Entered By: Rhae Hammock on 12/14/2020 08:55:54 -------------------------------------------------------------------------------- Lower Extremity Assessment Details Patient Name: Date of Service: [name] 12/14/2020 8:00 A M Medical Record Number: [id number] Patient Account Number: [id number] Date of Birth/Sex: Treating RN: 14-Jun-1970 (50 y.o. [name], [name] Primary Care Nga Rabon: [name], [name] Other Clinician: Referring Ryland Smoots: Treating Georgian Mcclory/Extender: [name], [name] Weeks in Treatment: 119 Edema Assessment Assessed: [Left: Yes] [Right: Yes] Edema: [Left: No] [Right: No] Calf Left: Right: Point of Measurement: 30 cm From Medial Instep 29 cm 28 cm Ankle Left: Right: Point of Measurement: 9 cm From Medial Instep 21 cm 19.5 cm Vascular Assessment Pulses: Dorsalis Pedis Palpable: [Left:Yes] [Right:Yes] Posterior Tibial Palpable: [Left:Yes] [Right:Yes] Electronic Signature(s) Signed: 12/15/2020 4:34:42 PM By: Rhae Hammock RN Entered By: Rhae Hammock on 12/14/2020 08:10:48 -------------------------------------------------------------------------------- Multi Wound Chart Details Patient Name: Date of Service: [name] 12/14/2020 8:00 A M Medical Record Number: [id number] Patient Account Number: [id number] Date of Birth/Sex: Treating RN: 05-01-1970 (50 y.o. [name], [name] Primary Care Torie Towle: [name], [name] Other Clinician: Referring Lawrence Roldan: Treating Dede Dobesh/Extender: [name], [name] Weeks in Treatment: 119 Vital Signs Height(in): 67 Capillary Blood Glucose(mg/dl): 222 Weight(lbs): 125 Pulse(bpm): 68 Body Mass Index(BMI): 20 Blood Pressure(mmHg): 137/66 Temperature(F): 98.7 Respiratory Rate(breaths/min): 17 Photos: [51:Left T Second oe] [52:Right Foot] [N/A:N/A N/A] Wound Location: [51:Trauma] [52:Gradually Appeared] [N/A:N/A] Wounding Event: [51:Diabetic Wound/Ulcer of the Lower] [52:Diabetic Wound/Ulcer of the Lower] [N/A:N/A] Primary Etiology: [51:Extremity Cataracts, Chronic sinus] [52:Extremity Cataracts, Chronic sinus] [N/A:N/A] Comorbid History: [51:problems/congestion, Anemia, Sleep problems/congestion, Anemia, Sleep Apnea, Deep Vein Thrombosis, Hypertension,  Peripheral Arterial Disease, Type I Diabetes, Osteoarthritis, Osteomyelitis, Neuropathy, Seizure Disorder 07/29/2020]  [52:Apnea, Deep Vein Thrombosis, Hypertension, Peripheral Arterial Disease, Type I Diabetes, Osteoarthritis, Osteomyelitis, Neuropathy, Seizure Disorder 09/02/2020] [N/A:N/A] Date Acquired: [51:19] [52:14] [N/A:N/A] Weeks of Treatment: [51:Open] [52:Open] [N/A:N/A] Wound Status: [51:No] [52:Yes] [N/A:N/A] Clustered Wound: [51:N/A] [52:2] [N/A:N/A] Clustered Quantity: [51:1x0.5x0.1] [52:2.5x3.5x0.2] [N/A:N/A] Measurements L x W x D (cm) [51:0.393] [52:6.872] [N/A:N/A] A (cm) : rea [51:0.039] [52:1.374] [N/A:N/A] Volume (cm) : [51:66.60%] [52:-121.00%] [N/A:N/A] % Reduction in A [51:rea: 89.00%] [52:-120.90%] [N/A:N/A] % Reduction in Volume: [51:Grade 2] [52:Grade 2] [N/A:N/A] Classification: [51:Medium] [52:Medium] [N/A:N/A] Exudate A mount: [51:Serosanguineous] [52:Serosanguineous] [N/A:N/A] Exudate Type: [51:red, brown] [52:red, brown] [N/A:N/A] Exudate Color: [51:Distinct, outline attached] [52:Distinct, outline attached] [N/A:N/A] Wound Margin: [51:Large (67-100%)] [52:Medium (34-66%)] [N/A:N/A] Granulation A mount: [51:Red] [52:Pink]  [N/A:N/A] Granulation Quality: [51:None Present (0%)] [52:Medium (34-66%)] [N/A:N/A] Necrotic A mount: [51:Fat Layer (Subcutaneous Tissue): Yes Fat Layer (Subcutaneous Tissue): Yes N/A] Exposed Structures: [51:Fascia: No Tendon: No Muscle: No Joint: No Bone: No None] [52:Fascia: No Tendon: No Muscle: No Joint: No Bone: No Small (1-33%)] [N/A:N/A] Epithelialization: [51:Debridement - Excisional] [52:Debridement - Excisional] [N/A:N/A] Debridement: Pre-procedure Verification/Time Out 08:33 [52:08:33] [N/A:N/A] Taken: [51:Lidocaine] [52:Lidocaine] [N/A:N/A] Pain Control: [51:Necrotic/Eschar, Subcutaneous,] [52:Necrotic/Eschar, Subcutaneous,] [N/A:N/A] Tissue Debrided: [51:Slough Skin/Subcutaneous Tissue] [52:Slough Skin/Subcutaneous Tissue] [N/A:N/A] Level: [51:0.5] [52:8.75] [N/A:N/A] Debridement A (sq cm): [51:rea Curette] [52:Curette] [N/A:N/A] Instrument: [51:Minimum] [52:Minimum] [N/A:N/A] Bleeding: [51:Pressure] [52:Pressure] [N/A:N/A] Hemostasis Achieved: [51:0] [52:0] [N/A:N/A] Procedural Pain: [51:0] [52:0] [N/A:N/A] Post Procedural Pain: Debridement Treatment Response: Procedure was tolerated well [52:Procedure was tolerated well] [N/A:N/A] Post Debridement Measurements L x 1x0.5x0.1 [52:2.5x3.5x0.2] [N/A:N/A] W x D (cm) [51:0.039] [52:1.374] [N/A:N/A] Post Debridement Volume: (cm) [51:Debridement] [52:Debridement] [N/A:N/A] Treatment Notes Wound #51 (Toe Second) Wound Laterality: Left Cleanser Soap and Water Discharge Instruction: May shower and wash wound with dial antibacterial soap and water prior to dressing change. Peri-Wound Care Topical Primary Dressing Promogran Prisma Matrix, 4.34 (sq in) (silver collagen) Discharge Instruction: Moisten collagen with saline or hydrogel Secondary Dressing Woven Gauze Sponge, Non-Sterile 4x4 in Discharge Instruction: Apply over primary dressing as directed. Secured With Conforming Stretch Gauze Bandage, Sterile 2x75  (in/in) Discharge Instruction: Secure with stretch gauze as directed. Paper Tape, 1x10 (in/yd) Discharge Instruction: Secure dressing with tape as directed. Compression Wrap Compression Stockings Add-Ons Wound #52 (Foot) Wound Laterality: Right Cleanser Soap and Water Discharge Instruction: May shower and wash wound with dial antibacterial soap and water prior to dressing change. Peri-Wound Care Topical Primary Dressing PolyMem Silver Non-Adhesive Dressing, 4.25x4.25 in Discharge Instruction: Apply to wound bed as instructed Secondary Dressing Woven Gauze Sponge, Non-Sterile 4x4 in Discharge Instruction: Apply over primary dressing as directed. Secured With Elastic Bandage 4 inch (ACE bandage) Discharge Instruction: Secure with ACE bandage as directed. Conforming Stretch Gauze Bandage, Sterile 2x75 (in/in) Discharge Instruction: Secure with stretch gauze as directed. Paper Tape, 1x10 (in/yd) Discharge Instruction: Secure dressing with tape as directed. Compression Wrap Compression Stockings Add-Ons Electronic Signature(s) Signed: 12/14/2020 4:44:54 PM By: Linton Ham MD Signed: 12/15/2020 4:34:42 PM By: Rhae Hammock RN Entered By: Linton Ham on 12/14/2020 10:16:48 -------------------------------------------------------------------------------- Multi-Disciplinary Care Plan Details Patient Name: Date of Service: [name] 12/14/2020 8:00 A M Medical Record Number: [id number] Patient Account Number: [id number] Date of Birth/Sex: Treating RN: 1971-01-12 (50 y.o. [name], [name] Primary Care Danyiel Crespin: [name], [name] Other Clinician: Referring Heberto Sturdevant: Treating Dawsyn Ramsaran/Extender: [name], [name] Weeks in Treatment: [place] reviewed with physician Active Inactive Nutrition Nursing Diagnoses: Imbalanced nutrition  Impaired glucose control: actual or potential Potential for alteratiion in  Nutrition/Potential for imbalanced nutrition Goals: Patient/caregiver agrees to and verbalizes understanding of need to use nutritional supplements and/or vitamins as prescribed Date Initiated: 09/03/2018 Date Inactivated: 10/07/2018 Target Resolution Date: 10/01/2018 Goal Status: Met Patient/caregiver will maintain therapeutic glucose control Date Initiated: 09/03/2018 Target Resolution Date: 11/25/2020 Goal Status: Active Interventions: Provide education on elevated blood sugars and impact on wound healing Treatment Activities: Patient referred to Primary Care Physician for further nutritional evaluation : 09/03/2018 Notes: 07/29/20: Glucose control ongoing, target date extended. Wound/Skin Impairment Nursing Diagnoses: Impaired tissue integrity Knowledge deficit related to ulceration/compromised skin integrity Goals: Patient/caregiver will verbalize understanding of skin care regimen Date Initiated: 09/03/2018 Target Resolution Date: 11/25/2020 Goal Status: Active Ulcer/skin breakdown will have a volume reduction of 30% by week 4 Date Initiated: 09/03/2018 Date Inactivated: 10/07/2018 Target Resolution Date: 10/01/2018 Goal Status: Unmet Unmet Reason: COMORBITIES Ulcer/skin breakdown will have a volume reduction of 50% by week 8 Date Initiated: 10/07/2018 Date Inactivated: 11/03/2018 Target Resolution Date: 10/31/2018 Goal Status: Unmet Unmet Reason: Osteomyelitis Interventions: Assess patient/caregiver ability to obtain necessary supplies Assess patient/caregiver ability to perform ulcer/skin care regimen upon admission and as needed Assess ulceration(s) every visit Provide education on ulcer and skin care Treatment Activities: Skin care regimen initiated : 09/03/2018 Topical wound management initiated : 09/03/2018 Notes: Electronic Signature(s) Signed: 12/15/2020 4:34:42 PM By: Rhae Hammock RN Entered By: Rhae Hammock on 12/14/2020  08:23:17 -------------------------------------------------------------------------------- Pain Assessment Details Patient Name: Date of Service: [name] 12/14/2020 8:00 A M Medical Record Number: [id number] Patient Account Number: [id number] Date of Birth/Sex: Treating RN: [birth year] (50 y.o. [name], [name] Primary Care Solveig Fangman: [name], [name] Other Clinician: Referring Avraj Lindroth: Treating Newman Waren/Extender: [name], [name] Weeks in Treatment: 119 Active Problems Location of Pain Severity and Description of Pain Patient Has Paino No Site Locations Pain Management and Medication Current Pain Management: Electronic Signature(s) Signed: 12/15/2020 4:34:42 PM By: Rhae Hammock RN Entered By: Rhae Hammock on 12/14/2020 08:10:31 -------------------------------------------------------------------------------- Patient/Caregiver Education Details Patient Name: Date of Service: [name] 9/14/2022andnbsp8:00 A M Medical Record Number: [id number] Patient Account Number: [id number] Date of Birth/Gender: Treating RN: [birth year] (50 y.o. [name], [name] Primary Care Physician: [name], [name] Other Clinician: Referring Physician: Treating Physician/Extender: [name], [name] Weeks in Treatment: 119 Education Assessment Education Provided To: Patient Education Topics Provided Elevated Blood Sugar/ Impact on Healing: Methods: Explain/Verbal Responses: State content correctly Wound/Skin Impairment: Methods: Explain/Verbal Responses: State content correctly Electronic Signature(s) Signed: 12/15/2020 4:34:42 PM By: Rhae Hammock RN Entered By: Rhae Hammock on 12/14/2020 08:23:36 -------------------------------------------------------------------------------- Wound Assessment Details Patient Name: Date of Service: [name] 12/14/2020 8:00 A M Medical Record Number:  [id number] Patient Account Number: [id number] Date of Birth/Sex: Treating RN: 19-Apr-1970 (50 y.o. [name], [name] Primary Care Suanne Minahan: [name], [name] Other Clinician: Referring Enrique Manganaro: Treating Aracelli Woloszyn/Extender: [name], [name] Weeks in Treatment: 119 Wound Status Wound Number: 51 Primary Diabetic Wound/Ulcer of the Lower Extremity Etiology: Wound Location: Left T Second oe Wound Open Wounding Event: Trauma Status: Date Acquired: 07/29/2020 Comorbid Cataracts, Chronic sinus problems/congestion, Anemia, Sleep Weeks Of Treatment: 19 History: Apnea, Deep Vein Thrombosis, Hypertension, Peripheral Arterial Clustered Wound: No Disease, Type I Diabetes, Osteoarthritis, Osteomyelitis, Neuropathy, Seizure Disorder Photos Wound Measurements Length: (cm) 1 Width: (cm) 0.5 Depth: (cm) 0.1 Area: (cm) 0.393 Volume: (cm) 0.039 % Reduction  in Area: 66.6% % Reduction in Volume: 89% Epithelialization: None Tunneling: No Undermining: No Wound Description Classification: Grade 2 Wound Margin: Distinct, outline attached Exudate Amount: Medium Exudate Type: Serosanguineous Exudate Color: red, brown Foul Odor After Cleansing: No Slough/Fibrino No Wound Bed Granulation Amount: Large (67-100%) Exposed Structure Granulation Quality: Red Fascia Exposed: No Necrotic Amount: None Present (0%) Fat Layer (Subcutaneous Tissue) Exposed: Yes Tendon Exposed: No Muscle Exposed: No Joint Exposed: No Bone Exposed: No Treatment Notes Wound #51 (Toe Second) Wound Laterality: Left Cleanser Soap and Water Discharge Instruction: May shower and wash wound with dial antibacterial soap and water prior to dressing change. Peri-Wound Care Topical Primary Dressing Promogran Prisma Matrix, 4.34 (sq in) (silver collagen) Discharge Instruction: Moisten collagen with saline or hydrogel Secondary Dressing Woven Gauze Sponge, Non-Sterile 4x4 in Discharge Instruction: Apply over  primary dressing as directed. Secured With Conforming Stretch Gauze Bandage, Sterile 2x75 (in/in) Discharge Instruction: Secure with stretch gauze as directed. Paper Tape, 1x10 (in/yd) Discharge Instruction: Secure dressing with tape as directed. Compression Wrap Compression Stockings Add-Ons Electronic Signature(s) Signed: 12/14/2020 5:11:07 PM By: Lorrin Jackson Signed: 12/15/2020 4:34:42 PM By: Rhae Hammock RN Entered By: Lorrin Jackson on 12/14/2020 08:20:17 -------------------------------------------------------------------------------- Wound Assessment Details Patient Name: Date of Service: [name] 12/14/2020 8:00 A M Medical Record Number: [id number] Patient Account Number: [id number] Date of Birth/Sex: Treating RN: [birth year] (50 y.o. [name], [name] Primary Care Wei Newbrough: [name], [name] Other Clinician: Referring Yazlin Ekblad: Treating Sally Reimers/Extender: [name], [name] Weeks in Treatment: 119 Wound Status Wound Number: 52 Primary Diabetic Wound/Ulcer of the Lower Extremity Etiology: Wound Location: Right Foot Wound Open Wounding Event: Gradually Appeared Status: Date Acquired: 09/02/2020 Comorbid Cataracts, Chronic sinus problems/congestion, Anemia, Sleep Weeks Of Treatment: 14 History: Apnea, Deep Vein Thrombosis, Hypertension, Peripheral Arterial Clustered Wound: Yes Disease, Type I Diabetes, Osteoarthritis, Osteomyelitis, Neuropathy, Seizure Disorder Photos Wound Measurements Length: (cm) 2.5 Width: (cm) 3.5 Depth: (cm) 0.2 Clustered Quantity: 2 Area: (cm) 6.872 Volume: (cm) 1.374 % Reduction in Area: -121% % Reduction in Volume: -120.9% Epithelialization: Small (1-33%) Tunneling: No Undermining: No Wound Description Classification: Grade 2 Wound Margin: Distinct, outline attached Exudate Amount: Medium Exudate Type: Serosanguineous Exudate Color: red, brown Foul Odor After Cleansing: No Slough/Fibrino  Yes Wound Bed Granulation Amount: Medium (34-66%) Exposed Structure Granulation Quality: Pink Fascia Exposed: No Necrotic Amount: Medium (34-66%) Fat Layer (Subcutaneous Tissue) Exposed: Yes Tendon Exposed: No Muscle Exposed: No Joint Exposed: No Bone Exposed: No Treatment Notes Wound #52 (Foot) Wound Laterality: Right Cleanser Soap and Water Discharge Instruction: May shower and wash wound with dial antibacterial soap and water prior to dressing change. Peri-Wound Care Topical Primary Dressing PolyMem Silver Non-Adhesive Dressing, 4.25x4.25 in Discharge Instruction: Apply to wound bed as instructed Secondary Dressing Woven Gauze Sponge, Non-Sterile 4x4 in Discharge Instruction: Apply over primary dressing as directed. Secured With Elastic Bandage 4 inch (ACE bandage) Discharge Instruction: Secure with ACE bandage as directed. Conforming Stretch Gauze Bandage, Sterile 2x75 (in/in) Discharge Instruction: Secure with stretch gauze as directed. Paper Tape, 1x10 (in/yd) Discharge Instruction: Secure dressing with tape as directed. Compression Wrap Compression Stockings Add-Ons Electronic Signature(s) Signed: 12/14/2020 5:11:07 PM By: Lorrin Jackson Signed: 12/15/2020 4:34:42 PM By: Rhae Hammock RN Entered By: Lorrin Jackson on 12/14/2020 08:20:53 -------------------------------------------------------------------------------- Vitals Details Patient Name: Date of Service: [place] LLO [place] Y, [name] 12/14/2020 8:00 A M Medical Record Number: [id number] Patient Account Number: [id number] Date of Birth/Sex: Treating RN: April 19, 1970 (50 y.o. [name], [name]  Primary Care Katniss Weedman: [name], [name] Other Clinician: Referring Hali Balgobin: Treating Marca Gadsby/Extender: [name], [name] Weeks in Treatment: 119 Vital Signs Time Taken: 08:09 Temperature (F): 98.7 Height (in): 67 Pulse (bpm): 84 Weight (lbs): 125 Respiratory Rate (breaths/min): 17 Body Mass  Index (BMI): 19.6 Blood Pressure (mmHg): 137/66 Capillary Blood Glucose (mg/dl): 222 Reference Range: 80 - 120 mg / dl Electronic Signature(s) Signed: 12/15/2020 4:34:42 PM By: Rhae Hammock RN Entered By: Rhae Hammock on 12/14/2020 08:10:26

## 2020-12-15 NOTE — Progress Notes (Signed)
HARLO, JASO ([id number]) Visit Report for 12/14/2020 Debridement Details Patient Name: Date of Service: [name] 12/14/2020 8:00 A M Medical Record Number: [id number] Patient Account Number: [id number] Date of Birth/Sex: Treating RN: Apr 18, 1970 (50 y.o. [name], [name] Primary Care Provider: [name], [name] Other Clinician: Referring Provider: Treating Provider/Extender: [name], [name] Weeks in Treatment: 119 Debridement Performed for Assessment: Wound #51 Left T Second oe Performed By: Physician Ricard Dillon., MD Debridement Type: Debridement Severity of Tissue Pre Debridement: Fat layer exposed Level of Consciousness (Pre-procedure): Awake and Alert Pre-procedure Verification/Time Out Yes - 08:33 Taken: Start Time: 08:33 Pain Control: Lidocaine T Area Debrided (L x W): otal 1 (cm) x 0.5 (cm) = 0.5 (cm) Tissue and other material debrided: Viable, Non-Viable, Eschar, Slough, Subcutaneous, Skin: Dermis , Skin: Epidermis, Slough Level: Skin/Subcutaneous Tissue Debridement Description: Excisional Instrument: Curette Bleeding: Minimum Hemostasis Achieved: Pressure End Time: 08:33 Procedural Pain: 0 Post Procedural Pain: 0 Response to Treatment: Procedure was tolerated well Level of Consciousness (Post- Awake and Alert procedure): Post Debridement Measurements of Total Wound Length: (cm) 1 Width: (cm) 0.5 Depth: (cm) 0.1 Volume: (cm) 0.039 Character of Wound/Ulcer Post Debridement: Improved Severity of Tissue Post Debridement: Fat layer exposed Post Procedure Diagnosis Same as Pre-procedure Electronic Signature(s) Signed: 12/14/2020 4:44:54 PM By: Linton Ham MD Signed: 12/15/2020 4:34:42 PM By: Rhae Hammock RN Entered By: Linton Ham on 12/14/2020 10:17:04 -------------------------------------------------------------------------------- Debridement Details Patient Name: Date of Service: GA LLO Abbott Pao  [name] 12/14/2020 8:00 A M Medical Record Number: [id number] Patient Account Number: [id number] Date of Birth/Sex: Treating RN: [birth year] (50 y.o. [name], [name] Primary Care Provider: [name], [name] Other Clinician: Referring Provider: Treating Provider/Extender: [name], [name] Weeks in Treatment: 119 Debridement Performed for Assessment: Wound #52 Right Foot Performed By: Physician Ricard Dillon., MD Debridement Type: Debridement Severity of Tissue Pre Debridement: Fat layer exposed Level of Consciousness (Pre-procedure): Awake and Alert Pre-procedure Verification/Time Out Yes - 08:33 Taken: Start Time: 08:33 Pain Control: Lidocaine T Area Debrided (L x W): otal 2.5 (cm) x 3.5 (cm) = 8.75 (cm) Tissue and other material debrided: Viable, Non-Viable, Eschar, Slough, Subcutaneous, Skin: Dermis , Skin: Epidermis, Slough Level: Skin/Subcutaneous Tissue Debridement Description: Excisional Instrument: Curette Bleeding: Minimum Hemostasis Achieved: Pressure End Time: 08:33 Procedural Pain: 0 Post Procedural Pain: 0 Response to Treatment: Procedure was tolerated well Level of Consciousness (Post- Awake and Alert procedure): Post Debridement Measurements of Total Wound Length: (cm) 2.5 Width: (cm) 3.5 Depth: (cm) 0.2 Volume: (cm) 1.374 Character of Wound/Ulcer Post Debridement: Improved Severity of Tissue Post Debridement: Fat layer exposed Post Procedure Diagnosis Same as Pre-procedure Electronic Signature(s) Signed: 12/14/2020 4:44:54 PM By: Linton Ham MD Signed: 12/15/2020 4:34:42 PM By: Rhae Hammock RN Entered By: Linton Ham on 12/14/2020 10:17:21 -------------------------------------------------------------------------------- HPI Details Patient Name: Date of Service: [name] 12/14/2020 8:00 A M Medical Record Number: [id number] Patient Account Number: [id number] Date of Birth/Sex: Treating RN: [birth year] (50  y.o. [name], [name] Primary Care Provider: [name], [name] Other Clinician: Referring Provider: Treating Provider/Extender: [name], [name] Weeks in Treatment: 2 History of Present Illness HPI Description: When50 year old diabetic who is known to have type 1 diabetes which is poorly controlled last hemoglobin A1c was 11%. She comes in with a ulcerated area on the left lateral foot which has been there for over 6 months. Was recently she has been  treated by [name] of podiatry who saw her last on 05/28/2016. Review of his notes revealed that the patient had incision and drainage with placement of antibiotic beads to the left foot on 04/11/2016 for possible osteomyelitis of the cuboid bone. Over the last year she's had a history of amputation of the left fifth toe and a femoropopliteal popliteal bypass graft somewhere in April 2017. 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 His note [name] mentions that the patient has been referred to me for further wound care and possibly great candidate for hyperbaric oxygen therapy due to recurrent osteomyelitis. However we do not have any x-rays of biopsy reports confirming this. He has been on several antibiotics including Bactrim and most recently is on doxycycline for an MRSA. I understand, the patient was not a candidate for IV antibiotics as she has had previous PICC lines which resulted in blood clots in both arms. There was a x-ray report dated 04/04/2016 on [name] notes which showed evidence of fifth ray resection left foot with osteolytic changes noted to the fourth metatarsal and cuboid bone on the left. 06/13/2016 -- had a left foot x-ray which showed no acute fracture or dislocation and no definite radiographic evidence of osteomyelitis. Advanced osteopenia was seen. 06/20/2016 -- she has noticed a new wound on the right plantar foot in the region where she had a callus before. 06/27/16- the  patient did have her x-ray of the right foot which showed no findings to suggest osteomyelitis. She saw her endocrinologist, [name], yesterday. Her A1c in January was 11. He also indicates mismanagement and noncompliance regarding her diabetes. She is currently on Bactrim for a lip infection. She is complaining of nausea, vomiting and diarrhea. She is unable to articulate the exact orders or dosing of the Bactrim; it is unclear when she will complete this. 07/04/2016 -- results from [facility] of ABIs with ankle waveforms were noted from 02/14/2016. The examination done on 06/27/2015 showed noncompressible ABIs with the right being 1.45 and the left being 1.33. The present examination showed a right ABI of 1.19 on the left of 1.33. The conclusion was that right normal ABI in the lower extremity at rest however compared to previous study which was noncompressible ABI may be falsely elevated side suggesting medial calcification. The left ABI suggested medial calcification. 08/01/2016 -- the patient had more redness and pain on her right foot and did not get to come to see as noted she see her PCP or go to the ER and decided to take some leftover metronidazole which she had at home. As usual, the patient does report she feels and is rather noncompliant. 08/08/2016 -- -- x-ray of the right foot -- FINDINGS:Transmetatarsal amputation is noted. No bony destruction is noted to suggest osteomyelitis. IMPRESSION: No evidence of osteomyelitis. Postsurgical changes are seen. MRI would be more sensitive for possible bony changes. Culture has grown Serratia Marcescens -- sensitive to Bactrim, ciprofloxacin, ceftazidime she was seen by [name] on 08/06/2016. He did not find any exposed bone, muscle, tendon, ligament or joint. There was no malodor and he did a excisional debridement in the office. ============ Old notes: 50 year old patient who is known to the wound clinic for a while had been away  from the wound clinic since 09/01/2014. Over the last several months she has been admitted to various hospitals including [facility] [place]. She was treated for a right metatarsal osteomyelitis with a transmetatarsal amputation and this was done about  2 months ago. He has a small ulcerated area on the right heel and she continues to have an ulcerated area on the left plantar aspect of the foot. The patient was recently admitted to the [facility] hospital group between 7/12 and 10/18/2014. she was given 3 weeks of IV vancomycin and was to follow-up with her surgeons at [facility] and also took oral vancomycin for C. difficile colitis. 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 MRI of the left foot done on 10/14/2014 showed no abscess or osteomyelitis. 04/27/15; this is a patient we know from previous stays in the wound care center. She is a type I diabetic I am not sure of her control currently. Since the last time I saw her she is had a right transmetatarsal amputation and has no wounds on her right foot and has no open wounds. She is been followed at the wound care center at [facility] in [place]. She comes today with the desire to undergo hyperbaric treatment locally. Apparently one of her wound care providers in [place] has suggested hyperbarics. This is in response to an MRI from 04/18/15 that showed increased marrow signal and loss of the proximal fifth metatarsal cortex evidence of osteomyelitis with likely early osteomyelitis in the cuboid bone as well. She has a large wound over the base of the fifth metatarsal. She also has a eschar over her the tips of her toes on 1,3 and 5. She does not have peripheral  pulses and apparently is going for an angiogram tomorrow which seems reasonable. After this she is going to infectious disease at [facility]. They have been using Medihoney to the large wound on the lateral aspect of the left foot to. The patient has known Charcot deformity from diabetic neuropathy. She also has known diabetic PAD. Surprisingly I can't see that she has had any recent antibiotics, the patient states the last antibiotic she had was at the end of November for 10 days. I think this was in response to culture that showed group G strep although I'm not exactly sure where the culture was from. She is also had arterial studies on 03/29/15. This showed a right ABI of 1.4 that was noncompressible. Her left ABI was 0.73. 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 It was not felt that arterial inflow was adequate for healing of a foot ulcer. Her Doppler waveforms looked monophasic ===== READMISSION 02/28/17; this is in an now 50 year old woman we've had at several different occasions in this clinic. 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 She has a remote ex-smoker. She was last seen in this clinic by Dr. Con Memos I think in May. More recently she is been followed by her podiatrist [name] an infectious disease Dr. Megan Salon. She has 2 open wounds the major one is over the right first metatarsal head she also has a wound on the left plantar foot. an MRI of the right foot on 01/01/17 showed a soft tissue ulcer along the plantar aspect of the first metatarsal base consistent with osteomyelitis of the first metatarsal stump. Dr. 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 According to the patient this is been open for slightly over a  month. She has been on a combination of Cipro 500 twice a day, Zyvox 600 twice a day and Flagyl 500 3 times a day for over a month  now as directed by Dr. Megan Salon. cultures of the right foot earlier this year showed MRSA in January and Serratia in May. January also had a few viridans strep. Recent x-rays of both feet were done and [name] office and I don't have these reports. The patient has known PAD and has a history of aleft femoropopliteal bypass in April 2017. 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 Dwyane Dee. In spite of this the last hemoglobin A1c I can see is 10.1 on 01/01/2017. She is being referred by 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 She is been using Medihoney to this area and also an area on the left midfoot. She is using healing sandals bilaterally. ABIs in this clinic at the left posterior tibial was 1.1 noncompressible on the right READMISSION Non invasive vascular NOVANT 5/18 Aftercare following surgery of the circulatory system Procedure Note - Interface, External Ris In - 08/13/2016 11:05 AM EDT Procedure: Examination consists of physiologic resting arterial pressures of the brachial and ankle arteries bilaterally with continuous wave Doppler waveform analysis. Previous: Previous exam performed on 02/14/16 demonstrated ABIs of Rt = 1.19 and Lt = 1.33. Right: ABI = non-compressible PT 1.47 DP. S/P transmet amputation. , Left: ABI = 1.52, 2nd digit pressure = 87 mmHg Conclusions: Right: ABI (>1.3) may be falsely elevated, suggesting medial calcification. Left: ABI (>1.3) may be falsely elevated, suggesting medial calcification The patient is a now 50 year old type I diabetic is had multiple issues her graded to chronic diabetic foot ulcers. She has had a previous right transmetatarsal amputation fifth ray amputation. She had Charcot feet diabetic  polyneuropathy. We had her in the clinic lastin November. At that point she had wounds on her bilateral feet.she had wanted to try hyperbarics however the healogics review process denied her because she hadn't followed up with her vascular surgeon for her left femoropopliteal bypass. The bypass was done by Dr. Raul Del at [facility]. We made her a follow-up with Dr. Raul Del however she did not keep the appointment and therefore she was not approved The patient shows me a small wound on her left fourth metatarsal head on her phone. She developed rapid discoloration in the plantar aspect of the left foot and she was admitted to hospital from 2/2 through 05/10/17 with wet gangrene of the left foot osteomyelitis of the fourth metatarsal heads. She was admitted acutely ill with a temperature of 103. She was started on broad-spectrum vancomycin and cefepime. On 05/06/17 she was taken to the OR by [name] her podiatric surgeon for an incision and drainage irrigation of the left foot wound. Cultures from this surgery revealed group be strep and anaerobes. she was seen by Dr.Xu of orthopedic surgery and scheduled for a below-knee amputation which she u refused. Ultimately she was discharged on Levaquin and Flagyl for one month. MRI 05/05/17 done while she was in the hospital showed abscess adjacent to the fourth metatarsal head and neck small abscess around the fourth flexor tendon. Inflammatory phlegmon and gas in the soft tissues along the lateral aspect of the fourth phalanx. Findings worrisome for  osteomyelitis involving the fourth proximal and middle phalanx and also the third and fourth metatarsals. Finally the patient had actually shortly before this followed up with Dr. Raul Del at no time on 04/29/17. He felt that her left femoropopliteal bypass was patent he felt that her left-sided toe pressures more than adequate for healing a wound on the left foot. This was before her acute presentation. Her  noninvasive diabetes are listed above. 05/28/17; she is started hyperbarics. The patient tells me that for some reason she was not actually on Levaquin but I think on ciprofloxacin. She was on Flagyl. She only started her Levaquin yesterday due to some difficulty with the pharmacy and perhaps her sister picking it up. She has an appointment with [name] tomorrow and with infectious disease early next week. She has no new complaints 06/06/17; the patient continues in hyperbarics. She saw [name] on 05/29/17 who is her podiatric surgeon. He is elected for a transmetatarsal amputation on 06/27/17. I'm not sure at what level he plans to do this amputation. The patient is unaware She also saw Dr. Megan Salon of infectious disease who elected to continue her on current antibiotics I think this is ciprofloxacin and Flagyl. I'll need to clarify with her tomorrow if she actually has this. We're using silver alginate to the actual wound. Necrotic surface today with material under the flap of her foot. Original MRI showed abscesses as well as osteomyelitis of the proximal and middle fourth phalanx and the third and fourth metatarsal heads 06/11/17; patient continues in hyperbarics and continues on oral antibiotics. She is doing well. The wound looks better. The necrotic part of this under the flap in her superior foot also looks better. she is been to see [name]. I haven't had a chance to look at his note. Apparently he has put the transmetatarsal amputation on hold her request it is still planning to take her to the OR for debridement and product application ACEL. I'll see if I can find his note. I'll therefore leave product ordering/requests to [name] for now. I was going to look at Dermagraft 06/18/17-she is here in follow-up evaluation for bilateral foot wounds. She continues with hyperbaric therapy. She states she has been applying manuka honey to the right plantar foot and alternate manuka honey and  silver alginate to the left foot, despite our orders. We will continue with same treatment plan and she will follo up next week. 06/25/17; I have reviewed [name] last note from 3/11. She has operative debridement in 2 days' time. By review his note apparently they're going to place there is skin over the majority of this wound which is a good choice. She has a small satellite area at the most proximal part of this wound on the left plantar foot. The area on the right plantar foot we've been using silver alginate and it is close to healing. 07/02/17; unfortunately the patient was not easily approved for [name] proposed surgery. I'm not completely certain what the issue is. She has been using silver alginate to the wound she has completed a first course of hyperbarics. She is still on Levaquin and Flagyl. I have really lost track of the time course here.I suspect she should have another week to 2 of antibiotics. I'll need to see if she is followed up with infectious disease Dr. Megan Salon 07/09/17; the patient is followed up with Dr. Megan Salon. 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  She continues on Levaquin and metronidazole continuing both of these for now I think she is been on fr about 6 weeks. She still has some drainage but no pain. No fever. Her had been plans for her to go to the OR for operative debridement with her podiatrist [name], I am not exactly sure where that is. I'll probably slip a note to [name] today. I note that she follows with Dr. Dwyane Dee of endocrinology. We have her recertified for hyperbaric oxygen. I have not heard about Dermagraft however I'll see if [name] is planning a skin substitute as well 07/16/17; the patient tells me she is just about out of Mona. I'll need to check Dr. Hale Bogus last notes on this. She states she has plenty of Flagyl however. She comes in today complaining of pain in the right lateral foot which  she said lasted for about a day. The wound on the right foot is actually much more medially. She also tells me that the [facility] cost a lot of pain in the left foot wound and she turned back to silver alginate. Finally Dermagraft has a $979 per application co-pay. She cannot afford this 07/23/17; patient arrives today with the wound not much smaller. There is not much new to add. She has not heard from [name] all try to put in a call to them today. She was asking about Dermagraft again and she has an over $892 per application co-pay she states that she would be willing to try to do a payment plan. I been tried to avoid this. We've been using silver alginate, I'll change to [facility] 07/30/17-She is here in follow-up evaluation for left foot ulcer. She continues hyperbaric medicine. The left foot ulcer is stable we will continue with same treatment plan 08/06/17; she is here for evaluation of her left foot ulcer. Currently being treated for hyperbarics or underlying osteomyelitis. She is completed antibiotics. The left foot ulcer is better smaller with healthier looking granulation. For various reasons I am not really clear on we never got her back to the OR with [name]. He did not respond to my secure text message. Nevertheless I think that surgery on this point is not necessary nor am I completely clear that a skin substitute is necessary The patient is complaining about pain on the outside of her right foot. 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 There is no erythema. She also states the foot is warm versus her other part of her upper leg and this is largely true. It is not totally clear to me what's causing this. She thinks it's different from her usual neuropathy pain 08/13/17; she arrives in clinic today with a small wound which is superficial on her right first metatarsal head. 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 She tells [place] she was up on  her feet over the Mother's Day celebration. The large wound is on the left foot. Continues with hyperbarics for underlying osteomyelitis. We're using Hydrofera Blue. She asked me today about where we were with Dermagraft. I had actually excluded this because of the co-pay however she wants to assume this therefore I'll recheck the co-pay an order for next week. 08/20/17; the patient agreed to accept the co-pay of the first Apligraf which we applied today. She is disappointed she is finishing hyperbarics will run this through the insurance on the extent of the foot infection and the extent of the wound that she had however she  is already had 60 dive's. Dermagraft No. 1 08/27/17; Dermagraft No. 2. She is not eligible for any more hyperbaric treatments this month. She reports a fair amount of drainage and she actually changed to the external dressings without disturbing the direct contact layer 09/03/17; the patient arrived in clinic today with the wound superficially looking quite healthy. Nice vibrant red tissue with some advancing epithelialization although not as much adherence of the flap as I might like. However she noted on her own fourth toe some bogginess and she brought that to our attention. Indeed this was boggy feeling like a possibility of subcutaneous fluid. 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 She is not been unwell. We've been using Dermagraft 09/10/17; the culture that I did not last week was MRSA. She saw Dr. Megan Salon this morning who is going to start her on vancomycin. I had sent him a secure a text message yesterday. I also spoke with her podiatric surgeon [name] about surgery on this foot the options for conserving a functional foot etc. Promised me he would see her and will make back consultation today. Paradoxically her actual wound on the plantar aspect of her left foot looks really quite good. I had  given her 5 days worth of Baxdella to cover her for MRSA. Her MRI came back showing osteomyelitis within the third metatarsal shaft and head and base of the third and fourth proximal phalanx. She had extensive inflammatory changes throughout the soft tissue of the lateral forefoot. With an ill-defined fluid around the fourth metatarsal extending into the plantar and dorsal soft tissues 09/19/17; the patient is actually on oral Septra and Flagyl. She apparently refused IV vancomycin. She also saw [name] at my request who is planning her for a left BKA sometime in mid July. MRI showed osteomyelitis within the third metatarsal shaft and head and the basis of the third and fourth proximal phalanx. I believe there was felt to be possible septic arthritis involving the third MTP. 09/26/17; the patient went back to Dr. Megan Salon at my suggestion and is now receiving IV daptomycin. Her wound continues to look quite good making the decision to proceed with a transmetatarsal amputation although more difficult for the patient. I believe in my extensive discussions with her she has a good sense of the pros and cons of this. I don't NV the tuft decision she has to make. She has an appointment with [name] I believe in mid July and I previously spoken to him about this issue Has we had used 3 previous Dermagraft. Given the condition of the wound surface I went ahead and added the fourth one today area and I did this not fully realizing that she'll be traveling to [place] next week. I'm hopeful she can come back in 2 weeks 10/21/17; Her same Dermagraft on for about 3-1/2 weeks. In spite of this the wound arrives looking quite healthy. There is been a lot of healing dimensions are smaller. Looking at the square shaped wound she has now there is some undermining and some depth medially under the undermining although I cannot palpate any bone. No surrounding infection is obvious. She has difficult questions about  how to look at this going forward vis--vis amputations versus continued medical therapy. T be truthful the wound is looks o so healthy and it is continued to contract. Hard to justify foot surgery at this point although  I still told her that I think it might come to that if we are not able to eradicate the underlying MRSA. She is still highly at risk and she understands this 11/06/17 on evaluation today patient appears to be doing better in regard to her foot ulcer. She's been tolerating the dressing changes without complication. Currently she is here for her Dermagraft #6. Her wound continues to make excellent progress at this point. She does not appear to have any evidence of infection which is good news. 11/13/17 on evaluation today patient appears to be doing excellent at this time. She is here for repeat Dermagraft application. This is #7. Overall her wound seems to be making great progress. 12/05/17; the patient arrives with the wound in much better condition than when I last saw this almost 6 weeks ago. She still has a small probing area in the left metatarsal head region on the lateral aspect of her foot. We applied her last Dermagraft today. Since the last time she is here she has what appears to a been a blood blister on the plantar aspect of left foot although I don't see this is threatening. There is also a thick raised tissue on the right mid metatarsal head region. This was not there I don't think the last time she was here 3 weeks ago. 12/12/17; the patient continues to have a small programming area in the left metatarsal head region on the lateral aspect of her foot which was the initial large surgical wound. I applied her last Apligraf last week. I'm going to use Endoform starting today Unfortunately she has an excoriated area in the left mid foot and the right mid foot. The left midfoot looks like a blistered area this was not opened last week it certainly is open today. Using silver  alginate on these areas. She promises me she is offloading this. 12/19/17; the small probing area in the left metatarsal head eyes think is shallower. In general her original wound looks better. We've been using Endoform. The area inferiorly that I think was trauma last week still requires debridement a lot of nonviable surface which I removed. She still has an open open area distally in her foot Similarly on the right foot there is tightly adherent surface debris which I removed. Still areas that don't look completely epithelialized. This is a small open area. We used silver alginate on these areas 12/26/2017; the patient did not have the supplies we ordered from last week including the Endoform. The original large wound on the left lateral foot looks healthy. She still has the undermining area that is largely unchanged from last week. She has the same heavily callused raised edged wounds on the right mid and left midfoot. Both of these requiring debridement. We have been using silver alginate on these areas 01/02/2018; there is still supply issues. We are going to try to use Prisma but I am not sure she actually got it from what she is saying. She has a new open area on the lateral aspect of the left fourth toe [previous fifth ray amputation]. Still the one tunneling area over the fourth metatarsal head. The area is in the midfoot bilaterally still have thick callus around them. She is concerned about a raised swelling on the lateral aspect of the foot. However she is completely insensate 01/10/2018; we are using Prisma to the wounds on her bilateral feet. Surprisingly the tunneling area over the left fourth metatarsal head that was part of her original surgery  has closed down. She has a small open area remaining on the incision line. 2 open areas in the midfoot. 02/10/2018; the patient arrives back in clinic after a month hiatus. She was traveling to visit family in [place]. Is fairly clear she was  not offloading the areas on her feet. The original wound over the left lateral foot at the level of metatarsal heads is reopened and probes medially by about a centimeter or 2. She notes that a week ago she had purulent drainage come out of an area on the left midfoot. Paradoxically the worst area is actually on the right foot is extensive with purulent drainage. We will use silver alginate today 02/17/2018; the patient has 3 wounds one over the left lateral foot. She still has a small area over the metatarsal heads which is the remnant of her original surgical wound. This has medial probing depth of roughly 1.4 cm somewhat better than last week. The area on the right foot is larger. We have been using silver alginate to all areas. The area on the right foot and left foot that we cultured last week showed both Klebsiella and Proteus. Both of these are quinolone sensitive. The patient put her's self on Bactrim and Flagyl that she had left hanging around from prior antibiotic usages. She was apparently on this last week when she arrived. I did not realize this. Unfortunately the Bactrim will not cover either 1 of these organisms. We will send in Cipro 500 twice daily for a week 03/04/2018; the patient has 2 wounds on the left foot one is the original wound which was a surgical wound for a deep DFU. At one point this had exposed bone. She still has an area over the fourth metatarsal head that probes about 1.4 cm although I think this is better than last week. I been using silver nitrate to try and promote tissue adherence and been using silver alginate here. She also has an area in the left midfoot. This has some depth but a small linear wound. Still requiring debridement. On the right midfoot is a circular wound. A lot of thick callus around this area. We have been using silver alginate to all wound areas She is completed the ciprofloxacin I gave her 2 weeks ago. 03/11/2018; the patient continues to  have 2 open areas on the left foot 1 of which was the original surgical wound for a deep DFU. Only a small probing area remains although this is not much different from last week we have been using silver alginate. The other area is on the midfoot this is smaller linear but still with some depth. We have been using silver alginate here as well On the right foot she has a small circular wound in the mid aspect. This is not much smaller than last time. We have been using silver alginate here as well 03/18/2018; she has 3 wounds on the left foot the original surgical wound, a very superficial wound in the mid aspect and then finally the area in the mid plantar foot. She arrives in today with a very concerning area in the wound in the mid plantar foot which is her most proximal wound. There is undermining here of roughly 1-1/2 cm superiorly. Serosanguineous drainage. She tells me she had some pain on for over the weekend that shot up her foot into her thigh and she tells me that she had a nodule in the groin area. She has the single wound in the right foot.  We are using endoform to both wound areas 03/24/2018; the patient arrives with the original surgical wound in the area on the left midfoot about the same as last week. There is a collection of fluid under the surface of the skin extending from the surgical wound towards the midfoot although it does not reach the midfoot wound. The area on the right foot is about the same. Cultures from last week of the left midfoot wound showed abundant Klebsiella abundant Enterococcus faecalis and moderate methicillin resistant staph I gave her Levaquin but this would have only covered the Klebsiella. She will need linezolid 04/01/2018; she is taking linezolid but for the first few days only took 1 a day. I have advised her to finish this at twice daily dosing. In any case all of her wounds are a lot better especially on the left foot. The original surgical wound is  closed. The area on the left midfoot considerably smaller. The area on the right foot also smaller. 04/08/2018; her original surgical wound/osteomyelitis on the left foot remains closed. She has area on the left foot that is in the midfoot area but she had some streaking towards this. This is not connected with her original wound at least not visually. Small wound on the right midfoot appears somewhat smaller. 04/15/18; both wounds looks better. Original wound is better left midfoot. Using silver alginate 1/21; patient states she uses saltwater soak in, stones or remove callus from around her wounds. She is also concerned about a blood blister she had on the left foot but it simply resolved on its own. We've been using silver alginate 1/28; the patient arrives today with the same streaking area from her metatarsals laterally [the site of her original surgical wound] down to the middle of her foot. There is some drainage in the subcutaneous area here. This concerns me that there is actually continued ongoing infection in the metatarsals probably the fourth and third. This fixates an MRI of the foot without contrast [chronic renal failure] The wound in the mid part of the foot is small but I wonder whether this area actually connects with the more distal foot. The area on the right midfoot is probably about the same. Callus thick skin around the small wound which I removed with a curette we have been using silver alginate on both wound areas 2/4; culture I did of the draining site on the left foot last time grew methicillin sensitive staph aureus. MRI of the left foot showed interval resolution of the findings surrounding the third metatarsal joint on the prior study consistent with treated osteomyelitis. Chronic soft tissue ulceration in the plantar and lateral aspect of the forefoot without residual focal fluid collection. No evidence of recurrent osteomyelitis. Noted to have the previous amputation of  the distal first phalanx and fifth ray MRI of the right foot showed no evidence of osteomyelitis I am going to treat the patient with a prolonged course of antibiotics directed against MSSA in the left foot 2/11; patient continues on cephalexin. She tells me she had nausea and vomiting over the weekend and missed 2 days. In general her foot looks much the same. She has a small open area just below the left fourth metatarsal head. A linear area in the left midfoot. Some discoloration extending from the inferior part of this into the left lateral foot although this appears to be superficial. She has a small area on the right midfoot which generally looks smaller after debridement 2/18; the patient  is completing his cephalexin and has another 2 days. She continues to have open areas on the left and right foot. 2/25; she is now off antibiotics. The area on the left foot at the site of her original surgical wound has closed yet again. She still has open areas in the mid part of her foot however these appear smaller. The area on the right mid foot looks about the same. We have been using silver alginate She tells me she had a serious hypoglycemic spell at home. She had to have EMS called and get IV dextrose 3/3; disappointing on the left lateral foot large area of necrotic tissue surrounding the linear area. This appears to track up towards the same original surgical wound. Required extensive debridement. The area on the right plantar foot is not a lot better also using silver 3/12; the culture I did last time showed abundant enterococcus. I have prescribed Augmentin, should cover any unrecognized anaerobes as well. In addition there were a few MRSA and Serratia that would not be well covered although I did not want to give her multiple antibiotics. She comes in today with a new wound in the right midfoot this is not connected with the original wound over her MTP a lot of thick callus tissue around both  wounds but once again she said she is not walking on these areas 3/17-Patient comes in for follow-up on the bilateral plantar wounds, the right midfoot and the left plantar wound. Both these are heavily callused surrounding the wounds. We are continuing to use silver alginate, she is compliant with offloading and states she uses a wheelchair fairly often at home 3/24; both wound areas have thick callus. However things actually look quite a bit better here for the majority of her left foot and the right foot. 3/31; patient continues to have thick callused somewhat irritated looking tissue around the wounds which individually are fairly superficial. There is no evidence of surrounding infection. We have been using silver alginate however I change that to [facility] today 4/17; patient returns to clinic after having a scare with Covid she tested negative in her primary doctor's office. She has been using Hydrofera Blue. She does not have an open area on the right foot. On the left foot she has a small open area with the mid area not completely viable. She showed me pictures of what looks like a hemorrhagic blister from several days ago but that seems to have healed over this was on the lateral left foot 4/21; patient comes in to clinic with both her wounds on her feet closed. However over the weekend she started having pain in her right foot and leg up into the thigh. She felt as though she was running a low-grade fever but did not take her temperature. She took a doxycycline that she had leftover and yesterday a single Septra and metronidazole. She thinks things feel somewhat better. 4/28; duplex ultrasound I ordered last week was negative for DVT or superficial thrombophlebitis. She is completed the doxycycline I gave her. States she is still having a lot of pain in the right calf and right ankle which is no better than last week. She cannot sleep. She also states she has a temperature of up  to 101, coughing and complaining of visual loss in her bilateral eyes. Apparently she was tested for Covid 2 weeks ago at [facility] and that was negative. Readmission: 09/03/18 patient presents back for reevaluation after having been evaluated at the end  of April regarding erythema and swelling of her right lower extremity. Subsequently she ended up going to the hospital on 07/29/18 and was admitted not to be discharged until 08/08/18. Unfortunately it was noted during the time that she was in the hospital that she did have methicillin-resistant Staphylococcus aureus as the infection noted at the site. It was also determined that she did have osteomyelitis which appears to be fairly significant. She was treated with vancomycin and in fact is still on IV vancomycin at dialysis currently. This is actually slated to continue until 09/12/18 at least which will be the completion of the six weeks of therapy. Nonetheless based on what I'm seeing at this point I'm not sure she will be anywhere near ready to discontinue antibiotics at that time. Since she was released from the hospital she was seen by [name] who is her podiatrist on 08/27/18. His note specifically states that he is recommended that the patient needs of one knee amputation on the right as she has a life- threatening situation that can lead quickly to sepsis. The patient advised she would like to try to save her leg to which [name] apparently told her that this was against all medical advice. She also want to discontinue the Wound VAC which had been initiated due to the fact that she wasn't pleased with how the wound was looking and subsequently she wanted to pursue applying Medihoney at that time. He stated that he did not believe that the right lower extremity was salvageable and that the patient understood but would still like to attempt hyperbaric option therapy if it could be of any benefit. She was therefore referred back to [place] for further  evaluation. He plans to see her back next week. Upon inspection today patient has a significant amount purulent drainage noted from the wound at this point. The bone in the distal portion of her foot also appears to be extremely necrotic and spongy. When I push down on the bone it bubbles and seeps purulent drainage from deeper in the end of the foot. I do not think that this is likely going to heal very well at all and less aggressive surgical debridement were undertaken more than what I believe we can likely do here in our office. 09/12/2018; I have not seen this patient since the most recent hospitalization although she was in our clinic last week. I have reviewed some of her records from a complex hospitalization. 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 There is situation was complicated by MRSA bacteremia and acute on chronic renal failure now on dialysis. She is receiving vancomycin at dialysis. We started her on Dakin's wet-to-dry last week she is changing this daily. There is still purulent drainage coming out of her foot. Although she is apparently "agreeable" to a below-knee amputation which is been suggested by multiple clinicians she wants this to be done in [place]. She apparently has a telehealth visit with that provider sometime in late [place] 6/24. I have told her I think this is probably too long. Nevertheless I could not convince her to allow a local doctor to perform BKA. 09/19/2018; the patient has a large necrotic area on the right anterior foot. She has had previous transmetatarsal amputations. Culture I did last week showed MRSA nothing else she is on vancomycin at dialysis. She has continued leaking purulent drainage out of the distal part of the large circular wound on the right anterior  foot. She apparently went to see Dr. 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 Somehow that translated into  her being referred to plastic surgery for debridement of the area. I gather she basically refused amputation although I do not have a copy of Dr. Berenice Primas notes. The patient really wants to have a trial of hyperbaric oxygen. I agreed with initial assessment in this clinic that this was probably too far along to benefit however if she is going to have plastic surgery I think she would benefit from ancillary hyperbaric oxygen. The issue here is that the patient has benefited as maximally as any patient I have ever seen from hyperbaric oxygen therapy. Most recently she had exposed bone on the lateral part of her left foot after a surgical procedure and that actually has closed. She has eschared areas in both heels but no open area. She is remained systemically well. I am not optimistic that anything can be done about this but the patient is very clear that she wants an attempt. The attempt would include a wound VAC further debridements and hyperbaric oxygen along with IV antibiotics. 6/26; I put her in for a trial of hyperbaric oxygen only because of the dramatic response she has had with wounds on her left midfoot earlier this year which was a surgical wound that went straight to her bone over the metatarsal heads and also remotely the left third toe. We will see if we can get this through our review process and insurance. She arrives in clinic with again purulent material pouring out of necrotic bone on the top of the foot distally. There is also some concerning erythema on the front of the leg that we marked. It is bit difficult to tell how tender this is because of neuropathy. I note from infectious disease that she had her vancomycin extended. 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 She had the wound VAC on for part of the week. The rest of the time she is putting various things on this including Medihoney, "ionized water" silver sorb gel etc. 7/7; follow-up along with  HBO. She is still on vancomycin at dialysis. She has a large open area on the dorsal right foot and a small dark eschar area on her heel. There is a lot less erythema in the area and a lot less tenderness. From an infection point of view I think this is better. She still has a lot of necrosis in the remaining right forefoot [previous TMA] we are still using the wound VAC in this area 7/16; follow-up along with HBO. I put her on linezolid after she finished her vancomycin. We started this last Friday I gave her 2 weeks worth. I had the expectation that she would be operatively debrided by Dr. Marla Roe but that still has not happened yet. Patient phoned the office this week. She arrives for review today after HBO. The distal part of this wound is completely necrotic. Nonviable pieces of tendon bone was still purulent drainage. Also concerning that she has black eschar over the heel that is expanding. I think this may be indicative of infection in this area as well. She has less erythema and warmth in the ankle and calf but still an abnormal exam 7/21 follow-up along with HBO. I will renew her linezolid after checking a CBC with differential monitoring her blood counts especially her platelets. She was supposed to have surgery yesterday but if I am reading things correctly  this was canceled after her blood sugar was found to be over 500. I thought Dr. Marla Roe who called me said that they were sending her to the ER but the patient states that was not the case. 7/28. Follow-up along with HBO. She is on linezolid I still do not have any lab work from dialysis even though I called last week. The patient is concerned about an area on her left lateral foot about the level of the base of her fifth metatarsal. I did not really see anything that ominous here however this patient is in [place] ability to point out problems that she is sensing and she has been accurate in the past Finally she received a call  from Dr. Marla Roe who is referring her to another orthopedic surgeon stating that she is too booked up to take her to the operating room now. Was still using a wound VAC on the foot 8/3 -Follow-up after HBO, she is got another week of linezolid, she is to call ID for an appointment, x-rays of both feet were reviewed, the left foot x-ray with third MTP joint osteo- Right foot x-ray widespread osteo-in the right midfoot Right ankle x-ray does not show any active evidence of infection 8/11-Patient is seen after HBO, the wounds on the right foot appear to be about the same, the heel wound had some necrotic base over tendon that was debrided with a curette 8/21; patient is seen after HBO. The patient's wound on her dorsal foot actually looks reasonably good and there is substantial amount of epithelialization however the open area distally still has a lot of necrotic debris partially bone. I cannot really get a good sense of just how deep this probes under the foot. She has been pressuring me this week to order medical maggots through a company in [place] for her. The problem I have is there is not a defined wound area here. On the positive side there is no purulence. She has been to see infectious disease she is still on [facility] although I have not had a chance to review their notes 8/28; patient is seen in conjunction with HBO. The wounds on her foot continued to improve including the right dorsal foot substantially the, the distal part of this wound and the area on the right heel. We have been using a wound VAC over this chronically. She is still on trimethoprim as directed by infectious disease 9/4; patient is seen in conjunction with HBO. Right dorsal foot wound substantially anteriorly is better however she continues to have a deep wound in the distal part of this that is not responding. We have been using silver collagen under border foam Area on the right plantar medial heel seems better.  We have been using Hydrofera Blue 12/12/18 on evaluation today patient appears to be doing about the same with regard to her wound based on prior measurements. She does have some necrotic tissue noted on the lateral aspect of the wound that is going require a little bit of sharp debridement today. This includes what appears to be potentially either severely necrotic bone or tendon. Nonetheless other than that she does not appear to have any severe infection which is good news 9/18; it is been 2 weeks since I saw this wound. She is tolerating HBO well. Continued dramatic improvement in the area on the right dorsal foot. She still has a small wound on the heel that we have been using Hydrofera Blue. She continues with a wound VAC  9/24; patient has to be seen emergently today with a swelling on her right lateral lower leg. She says that she told Dr. Evette Doffing about this and also myself on a couple of occasions but I really have no recollection of this. She is not systemically unwell and her wound really looked good the last time I saw this. She showed this to providers at dialysis and she was able to verify that she was started on cephalexin today for 5 doses at dialysis. She dialyzes on Tuesday Thursday and Saturday. 10/2; patient is seen in conjunction with HBO. The area that is draining on the right anterior medial tibia is more extensive. Copious amounts of serosanguineous drainage with some purulence. We are still using the wound VAC on the original wound then it is stable. Culture I did of the original IandD showed MRSA I contacted dialysis she is now on vancomycin with dialysis treatments. I asked them to run a month 10/9; patient seen in conjunction with HBO. She had a new spontaneous open area just above the wound on the right medial tibia ankle. More swelling on the right medial tibia. Her wound on the foot looks about the same perhaps slightly better. There is no warmth spreading up her leg but  no obvious erythema. her MRI of the foot and ankle and distal tib-fib is not booked for next Friday I discussed this with her in great detail over multiple days. it is likely she has spreading infection upper leg at least involving the distal 25% above the ankle. She knows that if I refer her to orthopedics for infectious disease they are going to recommend amputation and indeed I am not against this myself. We had a good trial at trying to heal the foot which is what she wanted along with antibiotics debridement and HBO however she clearly has spreading infection [probably staph aureus/MRSA]. Nevertheless she once again tells me she wants to wait the left of the MRI. She still makes comments about having her amputation done in [place]. 10/19; arrives today with significant swelling on the lateral right leg. Last culture I did showed Klebsiella. Multidrug-resistant. Cipro was intermediate sensitivity and that is what I have her on pending her MRI which apparently is going to be done on Thursday this week although this seems to be moving back and forth. She is not systemically unwell. We are using silver alginate on her major wound area on the right medial foot and the draining areas on the right lateral lower leg 10/26; MRI showed extensive abscess in the anterior compartment of the right leg also widespread osteomyelitis involving osseous structures of the midfoot and portions of the hindfoot. Also suspicion for osteomyelitis anterior aspect of the distal medial malleolus. Culture I did of the purulence once again showed a multidrug-resistant Klebsiella. I have been in contact with nephrology late last week and she has been started on cefepime at dialysis to replace the vancomycin We sent a copy of her MRI report to Dr. Geroge Baseman in [place] who is an orthopedic surgeon. The patient takes great stock in his opinion on this. She says she will go to [place] to have her leg amputated if Dr.  Geroge Baseman does not feel there is any salvage options. 11/2; she still is not talk to her orthopedic surgeon in [place]. Apparently he will call her at [phone] this afternoon. The quality of this is she has not allowed me to refer her anywhere. She has been told over and over that she needs  this amputated but has not agreed to be referred. She tells me her blood sugar was 600 last night but she has not been febrile. 11/9; she never did got a call from the orthopedic surgeon in [place] therefore that is off the radar. We have arranged to get her see orthopedic surgery at [facility]. She still has a lot of draining purulence coming out of the new abscess in her right leg although that probably came from the osteomyelitis in her right foot and heel. Meanwhile the original wound on the right foot looks very healthy. Continued improvement. The issue is that the last MRI showed osteomyelitis in her right foot extensively she now has an abscess in the right anterior lower leg. There is nobody in [place] who will offer this woman anything but an amputation and to be honest that is probably what she needs. I think she still wants to talk about limb salvage although at this point I just do not see that. She has completed her vancomycin at dialysis which was for the original staph aureus she is still on cefepime for the more recent Klebsiella. She has had a long course of both of these antibiotics which should have benefited the osteomyelitis on the right foot as well as the abscess. 11/16; apparently [place] elective surgery is shut down because of COVID-19 pandemic. I have reached out to some contacts at [facility] to see if we can get her an orthopedic appointment there. I am concerned about continually leaving this but for the moment everything is static. In fact her original large wound on this foot is closing down. It is the abscess on the right anterior leg that continues to drain purulent  serosanguineous material. She is not currently on any antibiotics however she had a prolonged course of vancomycin [1 month] as well as cefepime for a month 02/24/2019 on evaluation today patient appears to be doing better than the last time I saw her. This is not a patient that I typically see. With that being said I am covering for Dr. Dellia Nims this week and again compared to when I last saw her overall the wounds in particular seem to be doing significantly better which is good news. With that being said the patient tells me several disconcerting things. She has not been able to get in to see anyone for potential debridement in regard to her leg wounds although she tells me that she does not think it is necessary any longer because she is taking care of that herself. She noticed a string coming out of the lower wound on her leg over the last week. The patient states that she subsequently decided that we must of pack something in there and started pulling the string out and as it kept coming and coming she realized this was likely her tendon. With that being said she continued to remove as much of this as she could. She then I subsequently proceeded to using tubes of antibiotic ointment which she will stick down into the wound and then scored as much as she can until she sees it coming out of the other wound opening. She states that in doing this she is actually made things better and there is less redness and irritation. With regard to her foot wound she does have some necrotic tendon and tissue noted in one small corner but again the actual wound itself seems to be doing better with good granulation in general compared to my last evaluation. 12/7; continued improvement in  the wound on the substantial part of the right medial foot. Still a necrotic area inferiorly that required debridement but the rest of this looks very healthy and is contracting. She has 2 wounds on the right lateral leg which were  her original drainage sites from her abscess but all of this looks a lot better as well. She has been using silver alginate after putting antibiotic biotic ointment in one wound and watching it come out the other. I have talked to her in some detail today. I had given her names of orthopedic surgeons at [facility] for second opinion on what to do about the right leg. I do not think the patient never called them. She has not been able to get a hold of the orthopedic surgeon in [place] that she had put a lot of faith in as being somebody would give her an opinion that she would trust. I talked to her today and said even if I could get her in to another orthopedic surgeon about the leg which she accept an amputation and she said she would not therefore I am not going to press this issue for the moment 12/14; continued improvement in his substantial wound on the right medial foot. There is still a necrotic area inferiorly with tightly adherent necrotic debris which I have been working on debriding each time she is here. She does not have an orthopedic appointment. Since last time she was here I looked over her cultures which were essentially MRSA on the foot wound and gram-negative rods in the abscess on the anterior leg. 12/21; continued improvement in the area on the right medial foot. She is not up on this much and that is probably a good thing since I do not know it could support continuous ambulation. She has a small area on the right lateral leg which were remanence of the IandD's I did because of the abscess. I think she should probably have prophylactic antibiotics I am going to have to look this over to see if we can make an intelligent decision here. In the meantime her major wound is come down nicely. Necrotic area inferiorly is still there but looks a lot better 04/06/2019; she has had some improvement in the overall surface area on the right medial foot somewhat narrowedr both but somewhat  longer. The areas on the right lateral leg which were initial IandD sites are superficial. Nothing is present on the right heel. We are using silver alginate to the wound areas 1/18; right medial foot somewhat smaller. Still a deep probing area in the most distal recess of the wound. She has nothing open on the right leg. She has a new wound on the plantar aspect of her left fourth toe which may have come from just pulling skin. The patient using Medihoney on the wound on her foot under silver alginate. I cannot discourage her from this 2/1; 2-week follow-up using silver alginate on the right foot and her left fourth toe. The area on the right dorsal foot is contracted although there is still the deep area in the most distal part of the wound but still has some probing depth. No overt infection 2/15; 2-week follow-up. She continues to have improvement in the surface area on the dorsal right foot. Even the tunneling area from last time is almost closed. The area that was on the plantar part of her left fourth toe over the PIP is indeed closed 3/1; 2-week follow-up. Continued improvement in surface area. The  original divot that we have been debriding inferiorly I think has full epithelialization although the epithelialization is gone down into the wound with probably 4 mm of depth. Even under intense illumination I am unable to see anything open here. The remanence of the wound in this area actually look quite healthy. We have been using silver alginate 3/15; 2-week follow-up. Unfortunately not as good today. She has a comma shaped wound on the dorsal foot however the upper part of this is larger. Under illumination debris on the surface She also tells [place] that she was on her right leg 2 times in the last couple of weeks mostly to reach up for things above her head etc. She felt a sharp pain in the right leg which she thinks is somewhere from the ankle to the knee. The patient has neuropathy and is  really uncertain. She cannot feel her foot so she does not think it was coming from there 3/29; 2-week follow-up. Her wound measures smaller. Surface of the wound appears reasonable. She is using silver alginate with underlying Medihoney. She has home health. X-rays I did of her tib-fib last time were negative although it did show arterial calcification 4/12; 2-week follow-up. Her wound measures smaller in length. Using manuka honey with silver alginate on top. She has home health. 4/26; 2-week follow-up. Her wound is smaller but still very adherent debris under illumination requiring debridement she has been using manuka honey with silver alginate. 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 Been using Hydrofera Blue. She is on suppressive Bactrim but apparently she has been taking it twice daily 6/7; I have not seen this wound and about 6 weeks. Since then she was up in [place]. By her own admission she was walking on the foot because she did not have a wheelchair. The wound is not nearly as healthy looking as it was the last time I saw this. We ordered different things for her but she only uses Medihoney and silver alginate. As far as I know she is on suppressive trimethoprim sulfamethoxazole. She does not admit to any fever or chills. Her CBGs apparently are at baseline however she is saying that she feels some discomfort on the lateral part of her ankle I looked over her last inflammatory markers from the summer 2020 at which time she had a deeply necrotic infected wound in this area. On 11/10/2018 her sedimentation rate was 56 and C-reactive protein 9.9. This was 107 and 29 on 07/29/2018. 6/17; the patient had a necrotic wound the last time she was here on the right dorsal foot. After debridement I did a culture. This showed a very resistant ESBL Klebsiella as well as Enterococcus. Her x-ray  of the foot which was done because of warmth and some discomfort showed bone destruction within the carpal bones involving the navicular acute cuboid lateral middle cuneiforms but essentially unchanged from her prior study which was done on 10/29/2018. The findings were felt to represent chronic osteomyelitis. We did inflammatory markers on her. Her white count was 5.25 sedimentation rate 16 and C-reactive protein at 11.1. Notable for the fact that in August 2020 her CRP was 9.9 and sedimentation rate 56. I have looked at her x-rays. It is true that the bone destruction is very impressive however the patient came into this clinic for the wound on her right foot with pieces of bone  literally falling out anteriorly with purulent material. I am not exactly sure I could have expected anything different. She has not been systemically unwell no fever chills or blood sugars have been reasonable. 6/28; she arrives with a right heel closed. The substantial area on the right anterior foot looks healthy. Much better looking surface. I think we can change to [facility] seems to help this previously. She is getting her antibiotics at dialysis she should be just about finished 7/9; changed to [facility] last week. Surface wound looks satisfactory not much change in surface area however. She is going to [place] state next week this is usually a difficult thing for this patient follow-up will be for 2 weeks. 7/23; using Hydrofera Blue. She returns from her trip and the wound looks surprisingly good. Usually when this patient goes on trips she comes back with a lot of problems with the wound. She is saying that she sometimes feels an episodic "crunching" feeling on the lateral part of the foot. She is neuropathic and not feeling pain but wonders whether this could be a neuropathic dysesthesia. 11/13/19-Patient returns after 3 weeks, the wound itself is stable and patient states that there is nothing new going  on she is on some extra anxiety medications and is resisting the temptation to pick at the dry skin around the wound. 9/20; patient has not been here in over a month and I have not seen her in 2 months. The wound in terms of size I think is about the same. There is no exposed bone. She has a nonviable surface on this. She is supposed to be using [facility] however she is also been using some form of honey preparation as well as a silver-based dressings. I do not think she has any pattern to this. 10/4; 2-week follow-up. Patient has been using some form of spray which she says has honey and silver to purchase this online she has been covering it with gauze. In spite of this the wound actually looks quite good. The deeper divot distally appears to be close down. There is a rim of epithelialization. 10/18; 2-week follow-up. Patient has been using her Hydrofera Blue covered with her silver honey spray that she got online. 11/1; 2-week follow-up. She is using Hydrofera Blue with a silver honey spray. Wound bed is measuring smaller. She has noticed that her foot is warmer on the right. She is concerned about infection. For a long period of time I had her on prophylactic trimethoprim sulfamethoxazole DS 1 tablet daily. She is asking for this to be restarted. The patient is walking on this foot because of repairs that are being done in a home her but her room is on the second floor she has to go up and down stairs. I have cautioned against this however as usual she will do exactly what she wants to do 11/15; 2-week follow-up. She uses Hydrofera Blue with a silver/honey spray which I have never heard of. I think her wound looks about the same. Some epithelialization. No evidence that this is infected. I think she is walking on this more than we agreed on. She is going on extensive vacation over Thanksgiving 12/3; 2-week follow-up. She is using Hydrofera Blue however over Thanksgiving she ran out of this  and she is simply been using Medihoney. In spite of this her wound is smaller almost divided into 2 now. She traveled extensively over Thanksgiving and actually looks quite good in spite of this. Usually this is  been a marker of problems for her 12/17; 2-week follow-up. She is using Hydrofera Blue. The wound is smaller. Debris on the surface of this is fibrinous. She is traveling to [place] over the holidays which never bodes well for her wounds. I think she is walking more on her feet then she is even willing to admit and she tells me she does walk 2/11; using Hydrofera Blue. Her wounds are contracting however she walks in the clinic with a history that she has not been able to eat she has had vomiting. She also has right eye problems. Her blood sugar was 567. She had blood work done at dialysis and we called there to get her blood work although it still had not returned although they should have this by the end of the day we were told. She also stated that she was not sure what her blood sugar was as her glucose monitor was not working and not coming to tomorrow. She also for some reason does not think her insulin pump is working well. Her endocrinologist is Dr. Elayne Snare 06/03/2020 upon evaluation today patient actually appears to be doing excellent in regard to her wound. In fact she tells me it was not even bleeding until she picked a dry piece of skin off while we were getting ready to come in and see her today. Fortunately there is no signs of active infection which is great news and overall very pleased. She is going to be seeing her neurologist sometime shortly I believe it might even be on Monday. Nonetheless there can proceed with the work-up as far as anything else going on with her eye the fact that her right eyelid is drooping. 3/25; right medial dorsal foot. She has superficial areas here that appear to be fully epithelialized she is using Medihoney and silver alginate and  some combination. She has a new area on the mid left foot at roughly the level of the fifth metatarsal base 4/84/8; right medial dorsal foot. Still superficial areas here. I cleaned up 1 of these today she has been using I think mostly Medihoney but I would like her to use silver alginate. The area on the mid part of her foot also looks improved on the left. Since she was last here she tells me that she noted pus in the area of her left foot. She told dialysis about this they did a culture and ultimately she is on IV antibiotics but were not sure which one. They referred her to [name] he said that he did not need to follow her. I have not really verified any of this and I do not know what antibiotic she is on at dialysis 4/29; patient has been using Medihoney to her wounds. She reports taking off the toenail to her left second toe. She states that home health discharged her and she has not had help with dressing changes. She denies any signs and symptoms of infection. 5/13; 2-week follow-up. Miraculously the wound on the right foot dorsally is healed. She has an area on the tip of her left third toe and an area in the left midfoot. 5/26; patient presents for 2-week follow-up. She has 2 open wounds 1 to her plantar foot and the other to the left third toe. She has noticed increased swelling and redness to the toe wound. It is unclear exactly what she is doing for dressing changes as she has multiple dressing options at home. She states that it is easiest to  do Hydrofera Blue on the plantar wound and Prisma or Medihoney on the toe wound. 6/3; saw Dr. Maryjane Hurter week. She put her on Keflex because of the swelling I think of the left second toe. She arrives today with new breakdown x3 on the dorsal right foot which is disappointing. She also complains of pain in the right ankle, 3 liquid stools per day, chills without fever 6/17; patient was in [place]. I think a little more ambulatory than I would like  to hear. In any case she still has the 3 small open areas on the dorsal part of her foot on the right medial leg these look about the same as last time. She is supposed to be using Prisma but I think used Medihoney. She has an area on the left lateral plantar foot which had undermining under her skin and a blood blister with the 2 areas communicating today. And finally an area on the tip of her left second toe 6/30; patient has 3 small areas that look better than the last time on the right dorsal foot. She uses a combination of her products including collagen, Hydrofera Blue and the Medihoney nevertheless they look better. The area on the left midfoot might have been healed although she pulled some skin off superficial open area. The left second toe still does not look that healthy. Open areas on the tips of the 7/21; the areas on her right dorsal foot are still present although they look benign. We are using silver collagen although she may be using any of the products she had at home. The real problem here is the left second toe. Necrotic wound over the distal phalanx and distal interphalangeal joint. This probes directly to bone. She arrives in clinic today with an interesting story. She went to [place] last week. She became ill there with refractory nausea and vomiting. She went to an urgent care and then the ER. I am able to see parts of this in Care Everywhere. She had 2 blood cultures done both of which ultimately showed MRSA. This was sensitive to daptomycin and linezolid and vancomycin. Her white count was 4.8 hemoglobin 10.4. X-ray of the left foot showed prior resection of the left fifth metatarsal no evidence of osteomyelitis. Her C-reactive protein on 10/13/2020 was 104.8. She refused admission to the hospital as she was to board her flight to return to [place]. She dialyzed this morning I am not certain why she did not bring this to their attention 7/27; fortunately the left  second toe and the erythema in her left foot looks a lot better on the IV vancomycin at dialysis. I was able to verify earlier this week that she had indeed received it on the weekend starting last Saturday. She will need at least 4 weeks. I saw her left arm shunt and it looks fine. We are using silver alginate on the toe and we have been using I think Medihoney on the right foot which is actually been her decision. She has 2 open wounds dorsally. She does not describe fever chills her blood sugars are erratic but seem to be at her baseline 8/18; she has completed her antibiotics at dialysis but is unsure how long this was ordered. I have asked her to check when she goes to dialysis on Saturday. She is using polymen on the right foot dorsally and silver collagen not silver alginate to the left second toe. Her blood pressure is high today but blood sugars have been stable  according to her she is having no fever or systemic symptoms 9/14; about a month since we have seen her. In the interim she was admitted to hospital from 11/21/2020 through 12/01/2020 with delirium secondary to sepsis. She was felt to have sepsis from a left second toe MRSA infection. She is on IV vancomycin and I think will complete 6 weeks of this on 12/29/2020. Her blood cultures were negative. A transthoracic echo did not show any vegetations. I do not believe she had a TEE. She comes in today with a sizable wound on the right medial foot with some necrotic surface in the mid part of this and also a wound on the tip of her left second toe. She has been using Science writer) Signed: 12/14/2020 4:44:54 PM By: Linton Ham MD Entered By: Linton Ham on 12/14/2020 10:19:25 -------------------------------------------------------------------------------- Physical Exam Details Patient Name: Date of Service: [name] 12/14/2020 8:00 A M Medical Record Number: [id number] Patient Account Number:  [id number] Date of Birth/Sex: Treating RN: 10-18-1970 (50 y.o. [name], [name] Primary Care Provider: [name], [name] Other Clinician: Referring Provider: Treating Provider/Extender: [name], [name] Weeks in Treatment: 119 Constitutional Sitting or standing Blood Pressure is within target range for patient.. Pulse regular and within target range for patient.Marland Kitchen Respirations regular, non-labored and within target range.. Temperature is normal and within the target range for the patient.Marland Kitchen Appears in no distress. Notes Wound exam; left plantar foot is healed. She only has 2 wounds here one is on the right medial foot dorsally. Fairly sizable necrotic debris on the surface I removed with a #5 curette into the subcutaneous tissue to totally remove this. Hemostasis with direct pressure. Also on the tip of her left second toe a more superficial wound. This does not probe to bone. The toe itself does not look to be infected. No doubt this is improved versus what was described in the hospital. Her peripheral pulses are palpable on the left she has known PAD that has not been an issue for some period of time Electronic Signature(s) Signed: 12/14/2020 4:44:54 PM By: Linton Ham MD Entered By: Linton Ham on 12/14/2020 10:21:03 -------------------------------------------------------------------------------- Physician Orders Details Patient Name: Date of Service: Madelia, [name] 12/14/2020 8:00 A M Medical Record Number: [id number] Patient Account Number: [id number] Date of Birth/Sex: Treating RN: 04/19/1970 (50 y.o. [name], [name] Primary Care Provider: [name], [name] Other Clinician: Referring Provider: Treating Provider/Extender: [name], [name] Weeks in Treatment: [id number] Verbal / Phone Orders: No Diagnosis Coding Follow-up Appointments ppointment in 2 weeks. - with Dr. Dellia Nims Return A Bathing/ Shower/ Hygiene May shower and wash  wound with soap and water. Edema Control - Lymphedema / SCD / Other Bilateral Lower Extremities Elevate legs to the level of the heart or above for 30 minutes daily and/or when sitting, a frequency of: - throughout the day Avoid standing for long periods of time. Moisturize legs daily. - with dressing changes Off-Loading Open toe surgical shoe to: - to both feet Other: - minimal weight bearing right foot Wound Treatment Wound #51 - T Second oe Wound Laterality: Left Cleanser: Soap and Water Every Other Day/30 Days Discharge Instructions: May shower and wash wound with dial antibacterial soap and water prior to dressing change. Prim Dressing: Promogran Prisma Matrix, 4.34 (sq in) (silver collagen) Every Other Day/30 Days ary Discharge Instructions: Moisten collagen with saline or hydrogel Secondary Dressing: Woven Gauze Sponge,  Non-Sterile 4x4 in (Generic) Every Other Day/30 Days Discharge Instructions: Apply over primary dressing as directed. Secured With: Child psychotherapist, Sterile 2x75 (in/in) Every Other Day/30 Days Discharge Instructions: Secure with stretch gauze as directed. Secured With: Paper Tape, 1x10 (in/yd) Every Other Day/30 Days Discharge Instructions: Secure dressing with tape as directed. Wound #52 - Foot Wound Laterality: Right Cleanser: Soap and Water Every Other Day/30 Days Discharge Instructions: May shower and wash wound with dial antibacterial soap and water prior to dressing change. Prim Dressing: PolyMem Silver Non-Adhesive Dressing, 4.25x4.25 in Every Other Day/30 Days ary Discharge Instructions: Apply to wound bed as instructed Secondary Dressing: Woven Gauze Sponge, Non-Sterile 4x4 in (Generic) Every Other Day/30 Days Discharge Instructions: Apply over primary dressing as directed. Secured With: Elastic Bandage 4 inch (ACE bandage) Every Other Day/30 Days Discharge Instructions: Secure with ACE bandage as directed. Secured With: Hotel manager, Sterile 2x75 (in/in) Every Other Day/30 Days Discharge Instructions: Secure with stretch gauze as directed. Secured With: Paper Tape, 1x10 (in/yd) Every Other Day/30 Days Discharge Instructions: Secure dressing with tape as directed. Electronic Signature(s) Signed: 12/14/2020 4:44:54 PM By: Linton Ham MD Signed: 12/15/2020 4:34:42 PM By: Rhae Hammock RN Entered By: Rhae Hammock on 12/14/2020 08:36:43 -------------------------------------------------------------------------------- Problem List Details Patient Name: Date of Service: [name] 12/14/2020 8:00 A M Medical Record Number: [id number] Patient Account Number: [id number] Date of Birth/Sex: Treating RN: [birth year] (50 y.o. [name], [name] Primary Care Provider: [name], [name] Other Clinician: Referring Provider: Treating Provider/Extender: [name], [name] Weeks in Treatment: 119 Active Problems ICD-10 Encounter Code Description Active Date MDM Diagnosis L97.514 Non-pressure chronic ulcer of other part of right foot with necrosis of bone 09/03/2018 No Yes E10.621 Type 1 diabetes mellitus with foot ulcer 09/24/2018 No Yes L97.529 Non-pressure chronic ulcer of other part of left foot with unspecified severity 07/29/2020 No Yes M86.172 Other acute osteomyelitis, left ankle and foot 10/20/2020 No Yes Inactive Problems ICD-10 Code Description Active Date Inactive Date M86.671 Other chronic osteomyelitis, right ankle and foot 09/03/2018 09/03/2018 L97.411 Non-pressure chronic ulcer of right heel and midfoot limited to breakdown of skin 09/17/2019 09/17/2019 L97.521 Non-pressure chronic ulcer of other part of left foot limited to breakdown of skin 04/20/2019 04/20/2019 L97.812 Non-pressure chronic ulcer of other part of right lower leg with fat layer exposed 02/24/2019 02/24/2019 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 12/14/2020 8:00 A M Medical Record Number: [id number] Patient Account Number: [id number] Date of Birth/Sex: Treating RN: 1971/01/03 (50 y.o. [name], [name] Primary Care Provider: [name], [name] Other Clinician: Referring Provider: Treating Provider/Extender: [name], [name] Weeks in Treatment: 119 Subjective History of Present Illness (HPI) When59 year old diabetic who is known to have type 1 diabetes which is poorly controlled last hemoglobin A1c was 11%. She comes in with a ulcerated area on the left lateral foot which has been there for over 6 months. Was recently she has been treated by [name] of podiatry who saw her last on 05/28/2016. Review of his notes  revealed that the patient had incision and drainage with placement of antibiotic beads to the left foot on 04/11/2016 for possible osteomyelitis of the cuboid bone. Over the last year she's had a history of amputation of the left fifth toe and a femoropopliteal popliteal bypass graft somewhere in April 2017. 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 His note [name] mentions that the patient has been referred to me for further wound care and possibly great candidate for hyperbaric oxygen therapy due to recurrent osteomyelitis. However we do not have any x-rays of biopsy reports confirming this. He has been on several antibiotics including Bactrim and most recently is on doxycycline for an  MRSA. I understand, the patient was not a candidate for IV antibiotics as she has had previous PICC lines which resulted in blood clots in both arms. There was a x-ray report dated 04/04/2016 on [name] notes which showed evidence of fifth ray resection left foot with osteolytic changes noted to the fourth metatarsal and cuboid bone on the left. 06/13/2016 -- had a left foot x-ray which showed no acute fracture or dislocation and no definite radiographic evidence of osteomyelitis. Advanced osteopenia was seen. 06/20/2016 -- she has noticed a new wound on the right plantar foot in the region where she had a callus before. 06/27/16- the patient did have her x-ray of the right foot which showed no findings to suggest osteomyelitis. She saw her endocrinologist, [name], yesterday. Her A1c in January was 11. He also indicates mismanagement and noncompliance regarding her diabetes. She is currently on Bactrim for a lip infection. She is complaining of nausea, vomiting and diarrhea. She is unable to articulate the exact orders or dosing of the Bactrim; it is unclear when she will complete this. 07/04/2016 -- results from [facility] of ABIs with ankle waveforms were noted from 02/14/2016. The examination done on 06/27/2015 showed noncompressible ABIs with the right being 1.45 and the left being 1.33. The present examination showed a right ABI of 1.19 on the left of 1.33. The conclusion was that right normal ABI in the lower extremity at rest however compared to previous study which was noncompressible ABI may be falsely elevated side suggesting medial calcification. The left ABI suggested medial calcification. 08/01/2016 -- the patient had more redness and pain on her right foot and did not get to come to see as noted she see her PCP or go to the ER and decided to take some leftover metronidazole which she had at home. As usual, the patient does report she feels and is rather noncompliant. 08/08/2016 --  -- x-ray of the right foot -- FINDINGS:Transmetatarsal amputation is noted. No bony destruction is noted to suggest osteomyelitis. IMPRESSION: No evidence of osteomyelitis. Postsurgical changes are seen. MRI would be more sensitive for possible bony changes. Culture has grown Serratia Marcescens -- sensitive to Bactrim, ciprofloxacin, ceftazidime she was seen by [name] on 08/06/2016. He did not find any exposed bone, muscle, tendon, ligament or joint. There was no malodor and he did a excisional debridement in the office. ============ Old notes: 50 year old patient who is known to the wound clinic for a while had been away from the wound clinic since 09/01/2014. Over the last several months she has been admitted to various hospitals including [facility] [place]. She was treated for a right metatarsal osteomyelitis with a transmetatarsal amputation and this was done about 2 months ago. He has a small ulcerated area on the right heel and she continues  to have an ulcerated area on the left plantar aspect of the foot. The patient was recently admitted to the [facility] group between 7/12 and 10/18/2014. she was given 3 weeks of IV vancomycin and was to follow-up with her surgeons at [facility] and also took oral vancomycin for C. difficile colitis. 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 MRI of the left foot done on 10/14/2014 showed no abscess or osteomyelitis. 04/27/15; this is a patient we know from previous stays in the wound care center. She is a type I diabetic I am not sure of her control currently. Since the last time I saw her she is had a right transmetatarsal amputation and has no  wounds on her right foot and has no open wounds. She is been followed at the wound care center at [facility] in [place]. She comes today with the desire to undergo hyperbaric treatment locally. Apparently one of her wound care providers in [place] has suggested hyperbarics. This is in response to an MRI from 04/18/15 that showed increased marrow signal and loss of the proximal fifth metatarsal cortex evidence of osteomyelitis with likely early osteomyelitis in the cuboid bone as well. She has a large wound over the base of the fifth metatarsal. She also has a eschar over her the tips of her toes on 1,3 and 5. She does not have peripheral pulses and apparently is going for an angiogram tomorrow which seems reasonable. After this she is going to infectious disease at [facility]. They have been using Medihoney to the large wound on the lateral aspect of the left foot to. The patient has known Charcot deformity from diabetic neuropathy. She also has known diabetic PAD. Surprisingly I can't see that she has had any recent antibiotics, the patient states the last antibiotic she had was at the end of November for 10 days. I think this was in response to culture that showed group G strep although I'm not exactly sure where the culture was from. She is also had arterial studies on 03/29/15. This showed a right ABI of 1.4 that was noncompressible. Her left ABI was 0.73. 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 It was not felt that arterial inflow was adequate for healing of a foot ulcer. Her Doppler waveforms looked monophasic ===== READMISSION 02/28/17; this is in an now 50 year old woman we've had at several different occasions in this clinic. 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 She has a remote ex-smoker. She was last seen in this clinic by Dr. Con Memos I think in May. More recently she is been followed by her podiatrist Dr.  [name] an infectious disease Dr. Megan Salon. She has 2 open wounds the major one is over the right first metatarsal head she also has a wound on the left plantar foot. an MRI of the right foot on 01/01/17 showed a soft tissue ulcer along the plantar aspect of the first metatarsal base consistent with osteomyelitis of the first metatarsal stump. Dr. 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 According to the patient this is been open for slightly over a month. She has been on a combination of Cipro 500 twice a day, Zyvox 600  twice a day and Flagyl 500 3 times a day for over a month now as directed by Dr. Megan Salon. cultures of the right foot earlier this year showed MRSA in January and Serratia in May. January also had a few viridans strep. Recent x-rays of both feet were done and [name] office and I don't have these reports. The patient has known PAD and has a history of aleft femoropopliteal bypass in April 2017. 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 Dwyane Dee. In spite of this the last hemoglobin A1c I can see is 10.1 on 01/01/2017. She is being referred by 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 She is been using Medihoney to this area and also an area on the left midfoot. She is using healing sandals bilaterally. ABIs in this clinic at the left posterior tibial was 1.1 noncompressible on the right READMISSION Non invasive vascular NOVANT 5/18 Aftercare following surgery of the circulatory system Procedure Note - Interface, External Ris In - 08/13/2016 11:05 AM EDT Procedure: Examination consists of physiologic resting arterial pressures of the brachial and ankle arteries  bilaterally with continuous wave Doppler waveform analysis. Previous: Previous exam performed on 02/14/16 demonstrated ABIs of Rt = 1.19 and Lt = 1.33. Right: ABI = non-compressible PT 1.47 DP. S/P transmet amputation. , Left: ABI = 1.52, 2nd digit pressure = 87 mmHg Conclusions: Right: ABI (>1.3) may be falsely elevated, suggesting medial calcification. Left: ABI (>1.3) may be falsely elevated, suggesting medial calcification The patient is a now 50 year old type I diabetic is had multiple issues her graded to chronic diabetic foot ulcers. 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 We had her in the clinic lastin November. At that point she had wounds on her bilateral feet.she had wanted to try hyperbarics however the healogics review process denied her because she hadn't followed up with her vascular surgeon for her left femoropopliteal bypass. The bypass was done by Dr. Raul Del at [facility]. We made her a follow-up with Dr. Raul Del however she did not keep the appointment and therefore she was not approved The patient shows me a small wound on her left fourth metatarsal head on her phone. She developed rapid discoloration in the plantar aspect of the left foot and she was admitted to hospital from 2/2 through 05/10/17 with wet gangrene of the left foot osteomyelitis of the fourth metatarsal heads. She was admitted acutely ill with a temperature of 103. She was started on broad-spectrum vancomycin and cefepime. On 05/06/17 she was taken to the OR by [name] her podiatric surgeon for an incision and drainage irrigation of the left foot wound. Cultures from this surgery revealed group be strep and anaerobes. she was seen by Dr.Xu of orthopedic surgery and scheduled for a below-knee amputation which she u refused. Ultimately she was discharged on Levaquin and Flagyl for one month. MRI 05/05/17 done while she was in  the hospital showed abscess adjacent to the fourth metatarsal head and neck small abscess around the fourth flexor tendon. Inflammatory phlegmon and gas in the soft tissues along the lateral aspect of the fourth phalanx. Findings worrisome for osteomyelitis involving the fourth proximal and middle phalanx and also the third and fourth metatarsals.  Finally the patient had actually shortly before this followed up with Dr. Raul Del at no time on 04/29/17. He felt that her left femoropopliteal bypass was patent he felt that her left-sided toe pressures more than adequate for healing a wound on the left foot. This was before her acute presentation. 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 05/28/17; she is started hyperbarics. The patient tells me that for some reason she was not actually on Levaquin but I think on ciprofloxacin. She was on Flagyl. She only started her Levaquin yesterday due to some difficulty with the pharmacy and perhaps her sister picking it up. She has an appointment with [name] tomorrow and with infectious disease early next week. She has no new complaints 06/06/17; the patient continues in hyperbarics. She saw [name] on 05/29/17 who is her podiatric surgeon. He is elected for a transmetatarsal amputation on 06/27/17. I'm not sure at what level he plans to do this amputation. The patient is unaware ooShe also saw Dr. Megan Salon of infectious disease who elected to continue her on current antibiotics I think this is ciprofloxacin and Flagyl. I'll need to clarify with her tomorrow if she actually has this. We're using silver alginate to the actual wound. Necrotic surface today with material under the flap of her foot. ooOriginal MRI showed abscesses as well as osteomyelitis of the proximal and middle fourth phalanx and the third and fourth metatarsal heads 06/11/17; patient continues in hyperbarics and continues on oral antibiotics. She is doing well. The wound looks better. The necrotic  part of this under the flap in her superior foot also looks better. she is been to see [name]. I haven't had a chance to look at his note. Apparently he has put the transmetatarsal amputation on hold her request it is still planning to take her to the OR for debridement and product application ACEL. I'll see if I can find his note. I'll therefore leave product ordering/requests to [name] for now. I was going to look at Dermagraft 06/18/17-she is here in follow-up evaluation for bilateral foot wounds. She continues with hyperbaric therapy. She states she has been applying manuka honey to the right plantar foot and alternate manuka honey and silver alginate to the left foot, despite our orders. We will continue with same treatment plan and she will follo up next week. 06/25/17; I have reviewed [name] last note from 3/11. She has operative debridement in 2 days' time. By review his note apparently they're going to place there is skin over the majority of this wound which is a good choice. She has a small satellite area at the most proximal part of this wound on the left plantar foot. The area on the right plantar foot we've been using silver alginate and it is close to healing. 07/02/17; unfortunately the patient was not easily approved for [name] proposed surgery. I'm not completely certain what the issue is. She has been using silver alginate to the wound she has completed a first course of hyperbarics. She is still on Levaquin and Flagyl. I have really lost track of the time course here.I suspect she should have another week to 2 of antibiotics. I'll need to see if she is followed up with infectious disease Dr. Megan Salon 07/09/17; the patient is followed up with Dr. Megan Salon. 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 She continues on Levaquin and metronidazole continuing both of these for now I think she is  been on fr about 6 weeks. She still has some  drainage but no pain. No fever. Her had been plans for her to go to the OR for operative debridement with her podiatrist [name], I am not exactly sure where that is. I'll probably slip a note to [name] today. I note that she follows with Dr. Dwyane Dee of endocrinology. We have her recertified for hyperbaric oxygen. I have not heard about Dermagraft however I'll see if [name] is planning a skin substitute as well 07/16/17; the patient tells me she is just about out of [place]. I'll need to check Dr. Hale Bogus last notes on this. She states she has plenty of Flagyl however. She comes in today complaining of pain in the right lateral foot which she said lasted for about a day. The wound on the right foot is actually much more medially. She also tells me that the [facility] cost a lot of pain in the left foot wound and she turned back to silver alginate. Finally Dermagraft has a $209 per application co-pay. She cannot afford this 07/23/17; patient arrives today with the wound not much smaller. There is not much new to add. She has not heard from [name] all try to put in a call to them today. She was asking about Dermagraft again and she has an over $470 per application co-pay she states that she would be willing to try to do a payment plan. I been tried to avoid this. We've been using silver alginate, I'll change to [facility] 07/30/17-She is here in follow-up evaluation for left foot ulcer. She continues hyperbaric medicine. The left foot ulcer is stable we will continue with same treatment plan 08/06/17; she is here for evaluation of her left foot ulcer. Currently being treated for hyperbarics or underlying osteomyelitis. She is completed antibiotics. The left foot ulcer is better smaller with healthier looking granulation. For various reasons I am not really clear on we never got her back to the OR with [name]. He did not respond to my secure text message. Nevertheless I think that  surgery on this point is not necessary nor am I completely clear that a skin substitute is necessary The patient is complaining about pain on the outside of her right foot. 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 There is no erythema. She also states the foot is warm versus her other part of her upper leg and this is largely true. It is not totally clear to me what's causing this. She thinks it's different from her usual neuropathy pain 08/13/17; she arrives in clinic today with a small wound which is superficial on her right first metatarsal head. 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 She tells [place] she was up on her feet over the Mother's Day celebration. ooThe large wound is on the left foot. Continues with hyperbarics for underlying osteomyelitis. We're using Hydrofera Blue. She asked me today about where we were with Dermagraft. I had actually excluded this because of the co-pay however she wants to assume this therefore I'll recheck the co-pay an order for next week. 08/20/17; the patient agreed to accept the co-pay of the first Apligraf which we applied today. She is disappointed she is finishing hyperbarics will run this through the insurance on the extent of the foot infection and the extent of the wound that she had however she is already had 60 dive's. Dermagraft No. 1 08/27/17; Dermagraft No. 2. She is not eligible  for any more hyperbaric treatments this month. She reports a fair amount of drainage and she actually changed to the external dressings without disturbing the direct contact layer 09/03/17; the patient arrived in clinic today with the wound superficially looking quite healthy. Nice vibrant red tissue with some advancing epithelialization although not as much adherence of the flap as I might like. However she noted on her own fourth toe some bogginess and she brought that to our attention. Indeed this was boggy feeling like a possibility of  subcutaneous fluid. 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 She is not been unwell. We've been using Dermagraft 09/10/17; the culture that I did not last week was MRSA. She saw Dr. Megan Salon this morning who is going to start her on vancomycin. I had sent him a secure a text message yesterday. I also spoke with her podiatric surgeon [name] about surgery on this foot the options for conserving a functional foot etc. Promised me he would see her and will make back consultation today. Paradoxically her actual wound on the plantar aspect of her left foot looks really quite good. I had given her 5 days worth of Baxdella to cover her for MRSA. Her MRI came back showing osteomyelitis within the third metatarsal shaft and head and base of the third and fourth proximal phalanx. She had extensive inflammatory changes throughout the soft tissue of the lateral forefoot. With an ill-defined fluid around the fourth metatarsal extending into the plantar and dorsal soft tissues 09/19/17; the patient is actually on oral Septra and Flagyl. She apparently refused IV vancomycin. She also saw [name] at my request who is planning her for a left BKA sometime in mid July. MRI showed osteomyelitis within the third metatarsal shaft and head and the basis of the third and fourth proximal phalanx. I believe there was felt to be possible septic arthritis involving the third MTP. 09/26/17; the patient went back to Dr. Megan Salon at my suggestion and is now receiving IV daptomycin. Her wound continues to look quite good making the decision to proceed with a transmetatarsal amputation although more difficult for the patient. I believe in my extensive discussions with her she has a good sense of the pros and cons of this. I don't NV the tuft decision she has to make. She has an appointment with [name] I believe in mid July and I previously spoken to him  about this issue Has we had used 3 previous Dermagraft. Given the condition of the wound surface I went ahead and added the fourth one today area and I did this not fully realizing that she'll be traveling to [place] next week. I'm hopeful she can come back in 2 weeks 10/21/17; Her same Dermagraft on for about 3-1/2 weeks. In spite of this the wound arrives looking quite healthy. There is been a lot of healing dimensions are smaller. Looking at the square shaped wound she has now there is some undermining and some depth medially under the undermining although I cannot palpate any bone. No surrounding infection is obvious. She has difficult questions about how to look at this going forward vis--vis amputations versus continued medical therapy. T be truthful the wound is looks so o healthy and it is continued to contract. Hard to justify foot surgery at this point although I still told her that I think it might come to that if we are not  able to eradicate the underlying MRSA. She is still highly at risk and she understands this 11/06/17 on evaluation today patient appears to be doing better in regard to her foot ulcer. She's been tolerating the dressing changes without complication. Currently she is here for her Dermagraft #6. Her wound continues to make excellent progress at this point. She does not appear to have any evidence of infection which is good news. 11/13/17 on evaluation today patient appears to be doing excellent at this time. She is here for repeat Dermagraft application. This is #7. Overall her wound seems to be making great progress. 12/05/17; the patient arrives with the wound in much better condition than when I last saw this almost 6 weeks ago. She still has a small probing area in the left metatarsal head region on the lateral aspect of her foot. We applied her last Dermagraft today. ooSince the last time she is here she has what appears to a been a blood blister on the plantar aspect  of left foot although I don't see this is threatening. There is also a thick raised tissue on the right mid metatarsal head region. This was not there I don't think the last time she was here 3 weeks ago. 12/12/17; the patient continues to have a small programming area in the left metatarsal head region on the lateral aspect of her foot which was the initial large surgical wound. I applied her last Apligraf last week. I'm going to use Endoform starting today ooUnfortunately she has an excoriated area in the left mid foot and the right mid foot. The left midfoot looks like a blistered area this was not opened last week it certainly is open today. Using silver alginate on these areas. She promises me she is offloading this. 12/19/17; the small probing area in the left metatarsal head eyes think is shallower. In general her original wound looks better. We've been using Endoform. The area inferiorly that I think was trauma last week still requires debridement a lot of nonviable surface which I removed. She still has an open open area distally in her foot ooSimilarly on the right foot there is tightly adherent surface debris which I removed. Still areas that don't look completely epithelialized. This is a small open area. We used silver alginate on these areas 12/26/2017; the patient did not have the supplies we ordered from last week including the Endoform. The original large wound on the left lateral foot looks healthy. She still has the undermining area that is largely unchanged from last week. She has the same heavily callused raised edged wounds on the right mid and left midfoot. Both of these requiring debridement. We have been using silver alginate on these areas 01/02/2018; there is still supply issues. We are going to try to use Prisma but I am not sure she actually got it from what she is saying. She has a new open area on the lateral aspect of the left fourth toe [previous fifth ray amputation].  Still the one tunneling area over the fourth metatarsal head. The area is in the midfoot bilaterally still have thick callus around them. She is concerned about a raised swelling on the lateral aspect of the foot. However she is completely insensate 01/10/2018; we are using Prisma to the wounds on her bilateral feet. Surprisingly the tunneling area over the left fourth metatarsal head that was part of her original surgery has closed down. She has a small open area remaining on the incision line. 2  open areas in the midfoot. 02/10/2018; the patient arrives back in clinic after a month hiatus. She was traveling to visit family in [place]. Is fairly clear she was not offloading the areas on her feet. The original wound over the left lateral foot at the level of metatarsal heads is reopened and probes medially by about a centimeter or 2. She notes that a week ago she had purulent drainage come out of an area on the left midfoot. Paradoxically the worst area is actually on the right foot is extensive with purulent drainage. We will use silver alginate today 02/17/2018; the patient has 3 wounds one over the left lateral foot. She still has a small area over the metatarsal heads which is the remnant of her original surgical wound. This has medial probing depth of roughly 1.4 cm somewhat better than last week. The area on the right foot is larger. We have been using silver alginate to all areas. The area on the right foot and left foot that we cultured last week showed both Klebsiella and Proteus. Both of these are quinolone sensitive. The patient put her's self on Bactrim and Flagyl that she had left hanging around from prior antibiotic usages. She was apparently on this last week when she arrived. I did not realize this. Unfortunately the Bactrim will not cover either 1 of these organisms. We will send in Cipro 500 twice daily for a week 03/04/2018; the patient has 2 wounds on the left foot one is the  original wound which was a surgical wound for a deep DFU. At one point this had exposed bone. She still has an area over the fourth metatarsal head that probes about 1.4 cm although I think this is better than last week. I been using silver nitrate to try and promote tissue adherence and been using silver alginate here. ooShe also has an area in the left midfoot. This has some depth but a small linear wound. Still requiring debridement. ooOn the right midfoot is a circular wound. A lot of thick callus around this area. ooWe have been using silver alginate to all wound areas ooShe is completed the ciprofloxacin I gave her 2 weeks ago. 03/11/2018; the patient continues to have 2 open areas on the left foot 1 of which was the original surgical wound for a deep DFU. Only a small probing area remains although this is not much different from last week we have been using silver alginate. The other area is on the midfoot this is smaller linear but still with some depth. We have been using silver alginate here as well ooOn the right foot she has a small circular wound in the mid aspect. This is not much smaller than last time. We have been using silver alginate here as well 03/18/2018; she has 3 wounds on the left foot the original surgical wound, a very superficial wound in the mid aspect and then finally the area in the mid plantar foot. She arrives in today with a very concerning area in the wound in the mid plantar foot which is her most proximal wound. There is undermining here of roughly 1-1/2 cm superiorly. Serosanguineous drainage. She tells me she had some pain on for over the weekend that shot up her foot into her thigh and she tells me that she had a nodule in the groin area. ooShe has the single wound in the right foot. ooWe are using endoform to both wound areas 03/24/2018; the patient arrives with the original surgical  wound in the area on the left midfoot about the same as last week.  There is a collection of fluid under the surface of the skin extending from the surgical wound towards the midfoot although it does not reach the midfoot wound. The area on the right foot is about the same. Cultures from last week of the left midfoot wound showed abundant Klebsiella abundant Enterococcus faecalis and moderate methicillin resistant staph I gave her Levaquin but this would have only covered the Klebsiella. She will need linezolid 04/01/2018; she is taking linezolid but for the first few days only took 1 a day. I have advised her to finish this at twice daily dosing. In any case all of her wounds are a lot better especially on the left foot. The original surgical wound is closed. The area on the left midfoot considerably smaller. The area on the right foot also smaller. 04/08/2018; her original surgical wound/osteomyelitis on the left foot remains closed. She has area on the left foot that is in the midfoot area but she had some streaking towards this. This is not connected with her original wound at least not visually. ooSmall wound on the right midfoot appears somewhat smaller. 04/15/18; both wounds looks better. Original wound is better left midfoot. Using silver alginate 1/21; patient states she uses saltwater soak in, stones or remove callus from around her wounds. She is also concerned about a blood blister she had on the left foot but it simply resolved on its own. We've been using silver alginate 1/28; the patient arrives today with the same streaking area from her metatarsals laterally [the site of her original surgical wound] down to the middle of her foot. There is some drainage in the subcutaneous area here. This concerns me that there is actually continued ongoing infection in the metatarsals probably the fourth and third. This fixates an MRI of the foot without contrast [chronic renal failure] ooThe wound in the mid part of the foot is small but I wonder whether this area  actually connects with the more distal foot. ooThe area on the right midfoot is probably about the same. Callus thick skin around the small wound which I removed with a curette we have been using silver alginate on both wound areas 2/4; culture I did of the draining site on the left foot last time grew methicillin sensitive staph aureus. MRI of the left foot showed interval resolution of the findings surrounding the third metatarsal joint on the prior study consistent with treated osteomyelitis. Chronic soft tissue ulceration in the plantar and lateral aspect of the forefoot without residual focal fluid collection. No evidence of recurrent osteomyelitis. 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 She tells me she had nausea and vomiting over the weekend and missed 2 days. In general her foot looks much the same. She has a small open area just below the left fourth metatarsal head. A linear area in the left midfoot. Some discoloration extending from the inferior part of this into the left lateral foot although this appears to be superficial. She has a small area on the right midfoot which generally looks smaller after debridement 2/18; the patient is completing his cephalexin and has another 2 days. She continues to have open areas on  the left and right foot. 2/25; she is now off antibiotics. The area on the left foot at the site of her original surgical wound has closed yet again. She still has open areas in the mid part of her foot however these appear smaller. The area on the right mid foot looks about the same. We have been using silver alginate She tells me she had a serious hypoglycemic spell at home. She had to have EMS called and get IV dextrose 3/3;  disappointing on the left lateral foot large area of necrotic tissue surrounding the linear area. This appears to track up towards the same original surgical wound. Required extensive debridement. The area on the right plantar foot is not a lot better also using silver 3/12; the culture I did last time showed abundant enterococcus. I have prescribed Augmentin, should cover any unrecognized anaerobes as well. In addition there were a few MRSA and Serratia that would not be well covered although I did not want to give her multiple antibiotics. She comes in today with a new wound in the right midfoot this is not connected with the original wound over her MTP a lot of thick callus tissue around both wounds but once again she said she is not walking on these areas 3/17-Patient comes in for follow-up on the bilateral plantar wounds, the right midfoot and the left plantar wound. Both these are heavily callused surrounding the wounds. We are continuing to use silver alginate, she is compliant with offloading and states she uses a wheelchair fairly often at home 3/24; both wound areas have thick callus. However things actually look quite a bit better here for the majority of her left foot and the right foot. 3/31; patient continues to have thick callused somewhat irritated looking tissue around the wounds which individually are fairly superficial. There is no evidence of surrounding infection. We have been using silver alginate however I change that to [facility] today 4/17; patient returns to clinic after having a scare with Covid she tested negative in her primary doctor's office. She has been using Hydrofera Blue. She does not have an open area on the right foot. On the left foot she has a small open area with the mid area not completely viable. She showed me pictures of what looks like a hemorrhagic blister from several days ago but that seems to have healed over this was on the lateral left foot 4/21;  patient comes in to clinic with both her wounds on her feet closed. However over the weekend she started having pain in her right foot and leg up into the thigh. She felt as though she was running a low-grade fever but did not take her temperature. She took a doxycycline that she had leftover and yesterday a single Septra and metronidazole. She thinks things feel somewhat better. 4/28; duplex ultrasound I ordered last week was negative for DVT or superficial thrombophlebitis. She is completed the doxycycline I gave her. States she is still having a lot of pain in the right calf and right ankle which is no better than last week. She cannot sleep. She also states she has a temperature of up to 101, coughing and complaining of visual loss in her bilateral eyes. Apparently she was tested for Covid 2 weeks ago at [facility] and that was negative. Readmission: 09/03/18 patient presents back for reevaluation after having been evaluated at the end of April regarding erythema and swelling of her right lower extremity. Subsequently she ended up going  to the hospital on 07/29/18 and was admitted not to be discharged until 08/08/18. Unfortunately it was noted during the time that she was in the hospital that she did have methicillin-resistant Staphylococcus aureus as the infection noted at the site. It was also determined that she did have osteomyelitis which appears to be fairly significant. She was treated with vancomycin and in fact is still on IV vancomycin at dialysis currently. This is actually slated to continue until 09/12/18 at least which will be the completion of the six weeks of therapy. Nonetheless based on what I'm seeing at this point I'm not sure she will be anywhere near ready to discontinue antibiotics at that time. Since she was released from the hospital she was seen by [name] who is her podiatrist on 08/27/18. His note specifically states that he is recommended that the patient needs of one knee  amputation on the right as she has a life- threatening situation that can lead quickly to sepsis. The patient advised she would like to try to save her leg to which [name] apparently told her that this was against all medical advice. She also want to discontinue the Wound VAC which had been initiated due to the fact that she wasn't pleased with how the wound was looking and subsequently she wanted to pursue applying Medihoney at that time. He stated that he did not believe that the right lower extremity was salvageable and that the patient understood but would still like to attempt hyperbaric option therapy if it could be of any benefit. She was therefore referred back to [place] for further evaluation. He plans to see her back next week. Upon inspection today patient has a significant amount purulent drainage noted from the wound at this point. The bone in the distal portion of her foot also appears to be extremely necrotic and spongy. When I push down on the bone it bubbles and seeps purulent drainage from deeper in the end of the foot. I do not think that this is likely going to heal very well at all and less aggressive surgical debridement were undertaken more than what I believe we can likely do here in our office. 09/12/2018; I have not seen this patient since the most recent hospitalization although she was in our clinic last week. I have reviewed some of her records from a complex hospitalization. 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 There is situation was complicated by MRSA bacteremia and acute on chronic renal failure now on dialysis. She is receiving vancomycin at dialysis. We started her on Dakin's wet-to-dry last week she is changing this daily. There is still purulent drainage coming out of her foot. Although she is apparently "agreeable" to a below-knee amputation which is been suggested by multiple clinicians she wants this to be done in  [place]. She apparently has a telehealth visit with that provider sometime in late Julian 6/24. I have told her I think this is probably too long. Nevertheless I could not convince her to allow a local doctor to perform BKA. 09/19/2018; the patient has a large necrotic area on the right anterior foot. She has had previous transmetatarsal amputations. Culture I did last week showed MRSA nothing else she is on vancomycin at dialysis. She has continued leaking purulent drainage out of the distal part of the large circular wound on the right anterior foot. She apparently went to see Dr. Berenice Primas of orthopedics to discuss scheduling of her  below-knee amputation. Somehow that translated into her being referred to plastic surgery for debridement of the area. I gather she basically refused amputation although I do not have a copy of Dr. Berenice Primas notes. The patient really wants to have a trial of hyperbaric oxygen. I agreed with initial assessment in this clinic that this was probably too far along to benefit however if she is going to have plastic surgery I think she would benefit from ancillary hyperbaric oxygen. The issue here is that the patient has benefited as maximally as any patient I have ever seen from hyperbaric oxygen therapy. Most recently she had exposed bone on the lateral part of her left foot after a surgical procedure and that actually has closed. She has eschared areas in both heels but no open area. She is remained systemically well. I am not optimistic that anything can be done about this but the patient is very clear that she wants an attempt. The attempt would include a wound VAC further debridements and hyperbaric oxygen along with IV antibiotics. 6/26; I put her in for a trial of hyperbaric oxygen only because of the dramatic response she has had with wounds on her left midfoot earlier this year which was a surgical wound that went straight to her bone over the metatarsal heads and also  remotely the left third toe. We will see if we can get this through our review process and insurance. She arrives in clinic with again purulent material pouring out of necrotic bone on the top of the foot distally. There is also some concerning erythema on the front of the leg that we marked. It is bit difficult to tell how tender this is because of neuropathy. I note from infectious disease that she had her vancomycin extended. 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 She had the wound VAC on for part of the week. The rest of the time she is putting various things on this including Medihoney, "ionized water" silver sorb gel etc. 7/7; follow-up along with HBO. She is still on vancomycin at dialysis. She has a large open area on the dorsal right foot and a small dark eschar area on her heel. There is a lot less erythema in the area and a lot less tenderness. From an infection point of view I think this is better. She still has a lot of necrosis in the remaining right forefoot [previous TMA] we are still using the wound VAC in this area 7/16; follow-up along with HBO. I put her on linezolid after she finished her vancomycin. We started this last Friday I gave her 2 weeks worth. I had the expectation that she would be operatively debrided by Dr. Marla Roe but that still has not happened yet. Patient phoned the office this week. She arrives for review today after HBO. The distal part of this wound is completely necrotic. Nonviable pieces of tendon bone was still purulent drainage. Also concerning that she has black eschar over the heel that is expanding. I think this may be indicative of infection in this area as well. She has less erythema and warmth in the ankle and calf but still an abnormal exam 7/21 follow-up along with HBO. I will renew her linezolid after checking a CBC with differential monitoring her blood counts especially her platelets. She was supposed to have  surgery yesterday but if I am reading things correctly this was canceled after her blood sugar was found to be over 500. I thought  Dr. Marla Roe who called me said that they were sending her to the ER but the patient states that was not the case. 7/28. Follow-up along with HBO. She is on linezolid I still do not have any lab work from dialysis even though I called last week. The patient is concerned about an area on her left lateral foot about the level of the base of her fifth metatarsal. I did not really see anything that ominous here however this patient is in [place] ability to point out problems that she is sensing and she has been accurate in the past Finally she received a call from Dr. Marla Roe who is referring her to another orthopedic surgeon stating that she is too booked up to take her to the operating room now. Was still using a wound VAC on the foot 8/3 -Follow-up after HBO, she is got another week of linezolid, she is to call [facility] for an appointment, x-rays of both feet were reviewed, the left foot x-ray with third MTP joint osteo- Right foot x-ray widespread osteo-in the right midfoot Right ankle x-ray does not show any active evidence of infection 8/11-Patient is seen after HBO, the wounds on the right foot appear to be about the same, the heel wound had some necrotic base over tendon that was debrided with a curette 8/21; patient is seen after HBO. The patient's wound on her dorsal foot actually looks reasonably good and there is substantial amount of epithelialization however the open area distally still has a lot of necrotic debris partially bone. I cannot really get a good sense of just how deep this probes under the foot. She has been pressuring me this week to order medical maggots through a company in [place] for her. The problem I have is there is not a defined wound area here. On the positive side there is no purulence. She has been to see infectious disease she is still  on Septra DS although I have not had a chance to review their notes 8/28; patient is seen in conjunction with HBO. The wounds on her foot continued to improve including the right dorsal foot substantially the, the distal part of this wound and the area on the right heel. We have been using a wound VAC over this chronically. She is still on trimethoprim as directed by infectious disease 9/4; patient is seen in conjunction with HBO. Right dorsal foot wound substantially anteriorly is better however she continues to have a deep wound in the distal part of this that is not responding. We have been using silver collagen under border foam ooArea on the right plantar medial heel seems better. We have been using Hydrofera Blue 12/12/18 on evaluation today patient appears to be doing about the same with regard to her wound based on prior measurements. She does have some necrotic tissue noted on the lateral aspect of the wound that is going require a little bit of sharp debridement today. This includes what appears to be potentially either severely necrotic bone or tendon. Nonetheless other than that she does not appear to have any severe infection which is good news 9/18; it is been 2 weeks since I saw this wound. She is tolerating HBO well. Continued dramatic improvement in the area on the right dorsal foot. She still has a small wound on the heel that we have been using Hydrofera Blue. She continues with a wound VAC 9/24; patient has to be seen emergently today with a swelling on her right lateral lower  leg. She says that she told Dr. Evette Doffing about this and also myself on a couple of occasions but I really have no recollection of this. She is not systemically unwell and her wound really looked good the last time I saw this. She showed this to providers at dialysis and she was able to verify that she was started on cephalexin today for 5 doses at dialysis. She dialyzes on Tuesday Thursday and Saturday. 10/2;  patient is seen in conjunction with HBO. The area that is draining on the right anterior medial tibia is more extensive. Copious amounts of serosanguineous drainage with some purulence. We are still using the wound VAC on the original wound then it is stable. Culture I did of the original IandD showed MRSA I contacted dialysis she is now on vancomycin with dialysis treatments. I asked them to run a month 10/9; patient seen in conjunction with HBO. She had a new spontaneous open area just above the wound on the right medial tibia ankle. More swelling on the right medial tibia. Her wound on the foot looks about the same perhaps slightly better. There is no warmth spreading up her leg but no obvious erythema. her MRI of the foot and ankle and distal tib-fib is not booked for next Friday I discussed this with her in great detail over multiple days. it is likely she has spreading infection upper leg at least involving the distal 25% above the ankle. She knows that if I refer her to orthopedics for infectious disease they are going to recommend amputation and indeed I am not against this myself. We had a good trial at trying to heal the foot which is what she wanted along with antibiotics debridement and HBO however she clearly has spreading infection [probably staph aureus/MRSA]. Nevertheless she once again tells me she wants to wait the left of the MRI. She still makes comments about having her amputation done in [place]. 10/19; arrives today with significant swelling on the lateral right leg. Last culture I did showed Klebsiella. Multidrug-resistant. Cipro was intermediate sensitivity and that is what I have her on pending her MRI which apparently is going to be done on Thursday this week although this seems to be moving back and forth. She is not systemically unwell. We are using silver alginate on her major wound area on the right medial foot and the draining areas on the right lateral lower  leg 10/26; MRI showed extensive abscess in the anterior compartment of the right leg also widespread osteomyelitis involving osseous structures of the midfoot and portions of the hindfoot. Also suspicion for osteomyelitis anterior aspect of the distal medial malleolus. Culture I did of the purulence once again showed a multidrug-resistant Klebsiella. I have been in contact with nephrology late last week and she has been started on cefepime at dialysis to replace the vancomycin We sent a copy of her MRI report to Dr. Geroge Baseman in [place] who is an orthopedic surgeon. The patient takes great stock in his opinion on this. She says she will go to [place] to have her leg amputated if Dr. Geroge Baseman does not feel there is any salvage options. 11/2; she still is not talk to her orthopedic surgeon in [place]. Apparently he will call her at [phone] this afternoon. The quality of this is she has not allowed me to refer her anywhere. She has been told over and over that she needs this amputated but has not agreed to be referred. She tells me her blood sugar was  600 last night but she has not been febrile. 11/9; she never did got a call from the orthopedic surgeon in [place] therefore that is off the radar. We have arranged to get her see orthopedic surgery at [facility]. She still has a lot of draining purulence coming out of the new abscess in her right leg although that probably came from the osteomyelitis in her right foot and heel. Meanwhile the original wound on the right foot looks very healthy. Continued improvement. The issue is that the last MRI showed osteomyelitis in her right foot extensively she now has an abscess in the right anterior lower leg. There is nobody in [place] who will offer this woman anything but an amputation and to be honest that is probably what she needs. I think she still wants to talk about limb salvage although at this point I just do not see that. She has completed her  vancomycin at dialysis which was for the original staph aureus she is still on cefepime for the more recent Klebsiella. She has had a long course of both of these antibiotics which should have benefited the osteomyelitis on the right foot as well as the abscess. 11/16; apparently [place] elective surgery is shut down because of COVID-19 pandemic. I have reached out to some contacts at [facility] to see if we can get her an orthopedic appointment there. I am concerned about continually leaving this but for the moment everything is static. In fact her original large wound on this foot is closing down. It is the abscess on the right anterior leg that continues to drain purulent serosanguineous material. She is not currently on any antibiotics however she had a prolonged course of vancomycin [1 month] as well as cefepime for a month 02/24/2019 on evaluation today patient appears to be doing better than the last time I saw her. This is not a patient that I typically see. With that being said I am covering for Dr. Dellia Nims this week and again compared to when I last saw her overall the wounds in particular seem to be doing significantly better which is good news. With that being said the patient tells me several disconcerting things. She has not been able to get in to see anyone for potential debridement in regard to her leg wounds although she tells me that she does not think it is necessary any longer because she is taking care of that herself. She noticed a string coming out of the lower wound on her leg over the last week. The patient states that she subsequently decided that we must of pack something in there and started pulling the string out and as it kept coming and coming she realized this was likely her tendon. With that being said she continued to remove as much of this as she could. She then I subsequently proceeded to using tubes of antibiotic ointment which she will stick down into the wound and  then scored as much as she can until she sees it coming out of the other wound opening. She states that in doing this she is actually made things better and there is less redness and irritation. With regard to her foot wound she does have some necrotic tendon and tissue noted in one small corner but again the actual wound itself seems to be doing better with good granulation in general compared to my last evaluation. 12/7; continued improvement in the wound on the substantial part of the right medial foot. Still a necrotic area  inferiorly that required debridement but the rest of this looks very healthy and is contracting. She has 2 wounds on the right lateral leg which were her original drainage sites from her abscess but all of this looks a lot better as well. She has been using silver alginate after putting antibiotic biotic ointment in one wound and watching it come out the other. I have talked to her in some detail today. I had given her names of orthopedic surgeons at [facility] for second opinion on what to do about the right leg. I do not think the patient never called them. She has not been able to get a hold of the orthopedic surgeon in [place] that she had put a lot of faith in as being somebody would give her an opinion that she would trust. I talked to her today and said even if I could get her in to another orthopedic surgeon about the leg which she accept an amputation and she said she would not therefore I am not going to press this issue for the moment 12/14; continued improvement in his substantial wound on the right medial foot. There is still a necrotic area inferiorly with tightly adherent necrotic debris which I have been working on debriding each time she is here. She does not have an orthopedic appointment. Since last time she was here I looked over her cultures which were essentially MRSA on the foot wound and gram-negative rods in the abscess on the anterior leg. 12/21;  continued improvement in the area on the right medial foot. She is not up on this much and that is probably a good thing since I do not know it could support continuous ambulation. She has a small area on the right lateral leg which were remanence of the IandD's I did because of the abscess. I think she should probably have prophylactic antibiotics I am going to have to look this over to see if we can make an intelligent decision here. In the meantime her major wound is come down nicely. Necrotic area inferiorly is still there but looks a lot better 04/06/2019; she has had some improvement in the overall surface area on the right medial foot somewhat narrowedr both but somewhat longer. The areas on the right lateral leg which were initial IandD sites are superficial. Nothing is present on the right heel. We are using silver alginate to the wound areas 1/18; right medial foot somewhat smaller. Still a deep probing area in the most distal recess of the wound. She has nothing open on the right leg. She has a new wound on the plantar aspect of her left fourth toe which may have come from just pulling skin. The patient using Medihoney on the wound on her foot under silver alginate. I cannot discourage her from this 2/1; 2-week follow-up using silver alginate on the right foot and her left fourth toe. The area on the right dorsal foot is contracted although there is still the deep area in the most distal part of the wound but still has some probing depth. No overt infection 2/15; 2-week follow-up. She continues to have improvement in the surface area on the dorsal right foot. Even the tunneling area from last time is almost closed. The area that was on the plantar part of her left fourth toe over the PIP is indeed closed 3/1; 2-week follow-up. Continued improvement in surface area. The original divot that we have been debriding inferiorly I think has full epithelialization although the epithelialization  is  gone down into the wound with probably 4 mm of depth. Even under intense illumination I am unable to see anything open here. The remanence of the wound in this area actually look quite healthy. We have been using silver alginate 3/15; 2-week follow-up. Unfortunately not as good today. She has a comma shaped wound on the dorsal foot however the upper part of this is larger. Under illumination debris on the surface She also tells [place] that she was on her right leg 2 times in the last couple of weeks mostly to reach up for things above her head etc. She felt a sharp pain in the right leg which she thinks is somewhere from the ankle to the knee. The patient has neuropathy and is really uncertain. She cannot feel her foot so she does not think it was coming from there 3/29; 2-week follow-up. Her wound measures smaller. Surface of the wound appears reasonable. She is using silver alginate with underlying Medihoney. She has home health. X-rays I did of her tib-fib last time were negative although it did show arterial calcification 4/12; 2-week follow-up. Her wound measures smaller in length. Using manuka honey with silver alginate on top. She has home health. 4/26; 2-week follow-up. Her wound is smaller but still very adherent debris under illumination requiring debridement she has been using manuka honey with silver alginate. 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 Been using Hydrofera Blue. She is on suppressive Bactrim but apparently she has been taking it twice daily 6/7; I have not seen this wound and about 6 weeks. Since then she was up in [place]. By her own admission she was walking on the foot because she did not have a wheelchair. The wound is not nearly as healthy looking as it was the last time I saw this. We ordered different things for her but she only uses Medihoney and silver alginate.  As far as I know she is on suppressive trimethoprim sulfamethoxazole. She does not admit to any fever or chills. Her CBGs apparently are at baseline however she is saying that she feels some discomfort on the lateral part of her ankle I looked over her last inflammatory markers from the summer 2020 at which time she had a deeply necrotic infected wound in this area. On 11/10/2018 her sedimentation rate was 56 and C-reactive protein 9.9. This was 107 and 29 on 07/29/2018. 6/17; the patient had a necrotic wound the last time she was here on the right dorsal foot. After debridement I did a culture. This showed a very resistant ESBL Klebsiella as well as Enterococcus. Her x-ray of the foot which was done because of warmth and some discomfort showed bone destruction within the carpal bones involving the navicular acute cuboid lateral middle cuneiforms but essentially unchanged from her prior study which was done on 10/29/2018. The findings were felt to represent chronic osteomyelitis. We did inflammatory markers on her. Her white count was 5.25 sedimentation rate 16 and C-reactive protein at 11.1. Notable for the fact that in August 2020 her CRP was 9.9 and sedimentation rate 56. I have looked at her x-rays. It is true that the bone destruction is very impressive however the patient came into this clinic for the wound on her right foot with pieces of bone literally falling out anteriorly with purulent material. I am not exactly sure I could have expected  anything different. She has not been systemically unwell no fever chills or blood sugars have been reasonable. 6/28; she arrives with a right heel closed. The substantial area on the right anterior foot looks healthy. Much better looking surface. I think we can change to [facility] seems to help this previously. She is getting her antibiotics at dialysis she should be just about finished 7/9; changed to [facility] last week. Surface wound looks  satisfactory not much change in surface area however. She is going to [place] state next week this is usually a difficult thing for this patient follow-up will be for 2 weeks. 7/23; using Hydrofera Blue. She returns from her trip and the wound looks surprisingly good. Usually when this patient goes on trips she comes back with a lot of problems with the wound. She is saying that she sometimes feels an episodic "crunching" feeling on the lateral part of the foot. She is neuropathic and not feeling pain but wonders whether this could be a neuropathic dysesthesia. 11/13/19-Patient returns after 3 weeks, the wound itself is stable and patient states that there is nothing new going on she is on some extra anxiety medications and is resisting the temptation to pick at the dry skin around the wound. 9/20; patient has not been here in over a month and I have not seen her in 2 months. The wound in terms of size I think is about the same. There is no exposed bone. She has a nonviable surface on this. She is supposed to be using [facility] however she is also been using some form of honey preparation as well as a silver-based dressings. I do not think she has any pattern to this. 10/4; 2-week follow-up. Patient has been using some form of spray which she says has honey and silver to purchase this online she has been covering it with gauze. In spite of this the wound actually looks quite good. The deeper divot distally appears to be close down. There is a rim of epithelialization. 10/18; 2-week follow-up. Patient has been using her Hydrofera Blue covered with her silver honey spray that she got online. 11/1; 2-week follow-up. She is using Hydrofera Blue with a silver honey spray. Wound bed is measuring smaller. She has noticed that her foot is warmer on the right. She is concerned about infection. For a long period of time I had her on prophylactic trimethoprim sulfamethoxazole DS 1 tablet daily. She is  asking for this to be restarted. The patient is walking on this foot because of repairs that are being done in a home her but her room is on the second floor she has to go up and down stairs. I have cautioned against this however as usual she will do exactly what she wants to do 11/15; 2-week follow-up. She uses Hydrofera Blue with a silver/honey spray which I have never heard of. I think her wound looks about the same. Some epithelialization. No evidence that this is infected. I think she is walking on this more than we agreed on. She is going on extensive vacation over Thanksgiving 12/3; 2-week follow-up. She is using Hydrofera Blue however over Thanksgiving she ran out of this and she is simply been using Medihoney. In spite of this her wound is smaller almost divided into 2 now. She traveled extensively over Thanksgiving and actually looks quite good in spite of this. Usually this is been a marker of problems for her 12/17; 2-week follow-up. She is using Hydrofera Blue. The  wound is smaller. Debris on the surface of this is fibrinous. She is traveling to [place] over the holidays which never bodes well for her wounds. I think she is walking more on her feet then she is even willing to admit and she tells me she does walk 2/11; using Hydrofera Blue. Her wounds are contracting however she walks in the clinic with a history that she has not been able to eat she has had vomiting. She also has right eye problems. Her blood sugar was 567. She had blood work done at dialysis and we called there to get her blood work although it still had not returned although they should have this by the end of the day we were told. She also stated that she was not sure what her blood sugar was as her glucose monitor was not working and not coming to tomorrow. She also for some reason does not think her insulin pump is working well. Her endocrinologist is Dr. Elayne Snare 06/03/2020 upon evaluation today patient actually  appears to be doing excellent in regard to her wound. In fact she tells me it was not even bleeding until she picked a dry piece of skin off while we were getting ready to come in and see her today. Fortunately there is no signs of active infection which is great news and overall very pleased. She is going to be seeing her neurologist sometime shortly I believe it might even be on Monday. Nonetheless there can proceed with the work-up as far as anything else going on with her eye the fact that her right eyelid is drooping. 3/25; right medial dorsal foot. She has superficial areas here that appear to be fully epithelialized she is using Medihoney and silver alginate and some combination. ooShe has a new area on the mid left foot at roughly the level of the fifth metatarsal base 4/84/8; right medial dorsal foot. Still superficial areas here. I cleaned up 1 of these today she has been using I think mostly Medihoney but I would like her to use silver alginate. The area on the mid part of her foot also looks improved on the left. Since she was last here she tells me that she noted pus in the area of her left foot. She told dialysis about this they did a culture and ultimately she is on IV antibiotics but were not sure which one. They referred her to [name] he said that he did not need to follow her. I have not really verified any of this and I do not know what antibiotic she is on at dialysis 4/29; patient has been using Medihoney to her wounds. She reports taking off the toenail to her left second toe. She states that home health discharged her and she has not had help with dressing changes. She denies any signs and symptoms of infection. 5/13; 2-week follow-up. Miraculously the wound on the right foot dorsally is healed. She has an area on the tip of her left third toe and an area in the left midfoot. 5/26; patient presents for 2-week follow-up. She has 2 open wounds 1 to her plantar foot and the  other to the left third toe. She has noticed increased swelling and redness to the toe wound. It is unclear exactly what she is doing for dressing changes as she has multiple dressing options at home. She states that it is easiest to do [facility] on the plantar wound and Prisma or Medihoney on the toe wound.  6/3; saw Dr. Maryjane Hurter week. She put her on Keflex because of the swelling I think of the left second toe. She arrives today with new breakdown x3 on the dorsal right foot which is disappointing. She also complains of pain in the right ankle, 3 liquid stools per day, chills without fever 6/17; patient was in [place]. I think a little more ambulatory than I would like to hear. In any case she still has the 3 small open areas on the dorsal part of her foot on the right medial leg these look about the same as last time. She is supposed to be using Prisma but I think used Medihoney. She has an area on the left lateral plantar foot which had undermining under her skin and a blood blister with the 2 areas communicating today. And finally an area on the tip of her left second toe 6/30; patient has 3 small areas that look better than the last time on the right dorsal foot. She uses a combination of her products including collagen, Hydrofera Blue and the Medihoney nevertheless they look better. The area on the left midfoot might have been healed although she pulled some skin off superficial open area. The left second toe still does not look that healthy. Open areas on the tips of the 7/21; the areas on her right dorsal foot are still present although they look benign. We are using silver collagen although she may be using any of the products she had at home. The real problem here is the left second toe. Necrotic wound over the distal phalanx and distal interphalangeal joint. This probes directly to bone. She arrives in clinic today with an interesting story. She went to [place] last week. She became ill  there with refractory nausea and vomiting. She went to an urgent care and then the ER. I am able to see parts of this in Care Everywhere. She had 2 blood cultures done both of which ultimately showed MRSA. This was sensitive to daptomycin and linezolid and vancomycin. Her white count was 4.8 hemoglobin 10.4. X-ray of the left foot showed prior resection of the left fifth metatarsal no evidence of osteomyelitis. Her C-reactive protein on 10/13/2020 was 104.8. She refused admission to the hospital as she was to board her flight to return to [place]. She dialyzed this morning I am not certain why she did not bring this to their attention 7/27; fortunately the left second toe and the erythema in her left foot looks a lot better on the IV vancomycin at dialysis. I was able to verify earlier this week that she had indeed received it on the weekend starting last Saturday. She will need at least 4 weeks. I saw her left arm shunt and it looks fine. We are using silver alginate on the toe and we have been using I think Medihoney on the right foot which is actually been her decision. She has 2 open wounds dorsally. She does not describe fever chills her blood sugars are erratic but seem to be at her baseline 8/18; she has completed her antibiotics at dialysis but is unsure how long this was ordered. I have asked her to check when she goes to dialysis on Saturday. She is using polymen on the right foot dorsally and silver collagen not silver alginate to the left second toe. Her blood pressure is high today but blood sugars have been stable according to her she is having no fever or systemic symptoms 9/14; about a month since  we have seen her. In the interim she was admitted to hospital from 11/21/2020 through 12/01/2020 with delirium secondary to sepsis. She was felt to have sepsis from a left second toe MRSA infection. She is on IV vancomycin and I think will complete 6 weeks of this on 12/29/2020. Her blood  cultures were negative. A transthoracic echo did not show any vegetations. I do not believe she had a TEE. She comes in today with a sizable wound on the right medial foot with some necrotic surface in the mid part of this and also a wound on the tip of her left second toe. She has been using Medihoney Objective Constitutional Sitting or standing Blood Pressure is within target range for patient.. Pulse regular and within target range for patient.Marland Kitchen Respirations regular, non-labored and within target range.. Temperature is normal and within the target range for the patient.Marland Kitchen Appears in no distress. Vitals Time Taken: 8:09 AM, Height: 67 in, Weight: 125 lbs, BMI: 19.6, Temperature: 98.7 F, Pulse: 84 bpm, Respiratory Rate: 17 breaths/min, Blood Pressure: 137/66 mmHg, Capillary Blood Glucose: 222 mg/dl. General Notes: Wound exam; left plantar foot is healed. She only has 2 wounds here one is on the right medial foot dorsally. Fairly sizable necrotic debris on the surface I removed with a #5 curette into the subcutaneous tissue to totally remove this. Hemostasis with direct pressure. Also on the tip of her left second toe a more superficial wound. This does not probe to bone. The toe itself does not look to be infected. No doubt this is improved versus what was described in the hospital. Her peripheral pulses are palpable on the left she has known PAD that has not been an issue for some period of time Integumentary (Hair, Skin) Wound #51 status is Open. Original cause of wound was Trauma. The date acquired was: 07/29/2020. The wound has been in treatment 19 weeks. The wound is located on the Left T Second. The wound measures 1cm length x 0.5cm width x 0.1cm depth; 0.393cm^2 area and 0.039cm^3 volume. There is Fat Layer oe (Subcutaneous Tissue) exposed. There is no tunneling or undermining noted. There is a medium amount of serosanguineous drainage noted. The wound margin is distinct with the outline  attached to the wound base. There is large (67-100%) red granulation within the wound bed. There is no necrotic tissue within the wound bed. Wound #52 status is Open. Original cause of wound was Gradually Appeared. The date acquired was: 09/02/2020. The wound has been in treatment 14 weeks. The wound is located on the Right Foot. The wound measures 2.5cm length x 3.5cm width x 0.2cm depth; 6.872cm^2 area and 1.374cm^3 volume. There is Fat Layer (Subcutaneous Tissue) exposed. There is no tunneling or undermining noted. There is a medium amount of serosanguineous drainage noted. The wound margin is distinct with the outline attached to the wound base. There is medium (34-66%) pink granulation within the wound bed. There is a medium (34-66%) amount of necrotic tissue within the wound bed. 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 There was a Excisional Skin/Subcutaneous Tissue Debridement with a total  area of 0.5 sq cm performed by Ricard Dillon., MD. With the following instrument(s): Curette to remove Viable and Non-Viable tissue/material. Material removed includes Eschar, Subcutaneous Tissue, Slough, Skin: Dermis, and Skin: Epidermis after achieving pain control using Lidocaine. No specimens were taken. A time out was conducted at 08:33, prior to the start of the procedure. A Minimum amount of bleeding was controlled with Pressure. The procedure was tolerated well with a pain level of 0 throughout and a pain level of 0 following the procedure. Post Debridement  Measurements: 1cm length x 0.5cm width x 0.1cm depth; 0.039cm^3 volume. Character of Wound/Ulcer Post Debridement is improved. Severity of Tissue Post Debridement is: Fat layer exposed. Post procedure Diagnosis Wound #51: Same as Pre-Procedure Wound #52 Pre-procedure diagnosis of Wound #52 is a Diabetic Wound/Ulcer of the Lower Extremity located on the Right Foot .Severity of Tissue Pre Debridement is: Fat layer exposed. There was a Excisional Skin/Subcutaneous Tissue Debridement with a total area of 8.75 sq cm performed by Ricard Dillon., MD. With the following instrument(s): Curette to remove Viable and Non-Viable tissue/material. Material removed includes Eschar, Subcutaneous Tissue, Slough, Skin: Dermis, and Skin: Epidermis after achieving pain control using Lidocaine. No specimens were taken. A time out was conducted at 08:33, prior to the start of the procedure. A Minimum amount of bleeding was controlled with Pressure. The procedure was tolerated well with a pain level of 0 throughout and a pain level of 0 following the procedure. Post Debridement Measurements: 2.5cm length x 3.5cm width x 0.2cm depth; 1.374cm^3 volume. Character of Wound/Ulcer Post Debridement is improved. Severity of Tissue Post Debridement is: Fat layer exposed. Post procedure Diagnosis Wound #52: Same as Pre-Procedure Plan Follow-up Appointments: Return Appointment in 2 weeks. - with Dr. Dellia Nims Bathing/ Shower/ Hygiene: May shower and wash wound with soap and water. Edema Control - Lymphedema / SCD / Other: Elevate legs to the level of the heart or above for 30 minutes daily and/or when sitting, a frequency of: - throughout the day Avoid standing for long periods of time. Moisturize legs daily. - with dressing changes Off-Loading: Open toe surgical shoe to: - to both feet Other: - minimal weight bearing right foot WOUND #51: - T Second Wound Laterality: Left oe Cleanser: Soap and Water Every Other Day/30  Days Discharge Instructions: May shower and wash wound with dial antibacterial soap and water prior to dressing change. Prim Dressing: Promogran Prisma Matrix, 4.34 (sq in) (silver collagen) Every Other Day/30 Days ary Discharge Instructions: Moisten collagen with saline or hydrogel Secondary Dressing: Woven Gauze Sponge, Non-Sterile 4x4 in (Generic) Every Other Day/30 Days Discharge Instructions: Apply over primary dressing as directed. Secured With: Child psychotherapist, Sterile 2x75 (in/in) Every Other Day/30 Days Discharge Instructions: Secure with stretch gauze as directed. Secured With: Paper T ape, 1x10 (in/yd) Every Other Day/30 Days Discharge Instructions: Secure dressing with tape as directed. WOUND #52: - Foot Wound Laterality: Right Cleanser: Soap and Water Every Other Day/30 Days Discharge Instructions: May shower and wash wound with dial antibacterial soap and water prior to dressing change. Prim Dressing: PolyMem Silver Non-Adhesive Dressing, 4.25x4.25 in Every Other Day/30 Days ary Discharge Instructions: Apply to wound bed as instructed Secondary Dressing: Woven Gauze Sponge, Non-Sterile 4x4 in (Generic) Every Other Day/30 Days Discharge Instructions: Apply over primary dressing as directed. Secured With: Elastic Bandage 4 inch (ACE bandage) Every Other Day/30 Days Discharge Instructions: Secure with ACE bandage as directed. Secured With: Child psychotherapist, Sterile 2x75 (in/in) Every  Other Day/30 Days Discharge Instructions: Secure with stretch gauze as directed. Secured With: Paper T ape, 1x10 (in/yd) Every Other Day/30 Days Discharge Instructions: Secure dressing with tape as directed. 1. Debridement in both wound areas. On the left second toe we are continuing with Prisma in the right dorsal foot polymen silver 2. She is traveling to [place] next week and then on to [place]. In the past when she is travel that does not spell well for her  wounds and I discussed this with her. Attempt to keep the pressure off these feet. Electronic Signature(s) Signed: 12/14/2020 4:44:54 PM By: Linton Ham MD Entered By: Linton Ham on 12/14/2020 10:23:00 -------------------------------------------------------------------------------- SuperBill Details Patient Name: Date of Service: [place] [name] 12/14/2020 Medical Record Number: [id number] Patient Account Number: [id number] Date of Birth/Sex: Treating RN: 15-Feb-1971 (50 y.o. 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 M86.172 Other acute osteomyelitis, left ankle and foot Facility Procedures CPT4 Code: [id number] Description: 47829 - DEB SUBQ TISSUE 20 SQ CM/< ICD-10 Diagnosis Description L97.514 Non-pressure chronic ulcer of other part of right foot with necrosis of bone L97.529 Non-pressure chronic ulcer of other part of left foot with unspecified severit Modifier: y Quantity: 1 Physician Procedures : CPT4 Code Description Modifier [id number] - WC PHYS SUBQ TISS 20 SQ CM ICD-10 Diagnosis Description L97.514 Non-pressure chronic ulcer of other part of right foot with necrosis of bone L97.529 Non-pressure chronic ulcer of other part of left foot  with unspecified severity Quantity: 1 Electronic Signature(s) Signed: 12/14/2020 4:44:54 PM By: Linton Ham MD Entered By: Linton Ham on 12/14/2020 10:23:13

## 2021-01-03 ENCOUNTER — Encounter (HOSPITAL_BASED_OUTPATIENT_CLINIC_OR_DEPARTMENT_OTHER): Payer: Medicare HMO | Admitting: Internal Medicine

## 2021-01-10 ENCOUNTER — Encounter (HOSPITAL_BASED_OUTPATIENT_CLINIC_OR_DEPARTMENT_OTHER): Payer: Medicare HMO | Admitting: Internal Medicine

## 2021-01-11 ENCOUNTER — Encounter (HOSPITAL_BASED_OUTPATIENT_CLINIC_OR_DEPARTMENT_OTHER): Payer: Medicare HMO | Admitting: Internal Medicine

## 2021-01-13 ENCOUNTER — Other Ambulatory Visit: Payer: Self-pay

## 2021-01-13 ENCOUNTER — Encounter: Payer: Self-pay | Admitting: Endocrinology

## 2021-01-13 ENCOUNTER — Ambulatory Visit (INDEPENDENT_AMBULATORY_CARE_PROVIDER_SITE_OTHER): Payer: Medicare HMO | Admitting: Endocrinology

## 2021-01-13 VITALS — BP 128/76 | HR 55 | Ht 67.0 in | Wt 126.6 lb

## 2021-01-13 DIAGNOSIS — E7800 Pure hypercholesterolemia, unspecified: Secondary | ICD-10-CM

## 2021-01-13 DIAGNOSIS — I10 Essential (primary) hypertension: Secondary | ICD-10-CM

## 2021-01-13 DIAGNOSIS — E1065 Type 1 diabetes mellitus with hyperglycemia: Secondary | ICD-10-CM | POA: Diagnosis not present

## 2021-01-13 NOTE — Patient Instructions (Signed)
DO NOT USE SLEEP MODE If sugar stays over 300 do corections

## 2021-01-13 NOTE — Progress Notes (Signed)
Patient ID: [name], female   DOB: [birth year], 50 y.o.   MRN: [id number]   Reason for Appointment: follow-up of diabetes  History of Present Illness   Diagnosis: Type 1 DIABETES MELITUS  Previous history: She has had long-standing poorly controlled diabetes and typically has poor compliance with self care measures despite periodic diabetes education and periodic followup  INSULIN PUMP regimen:   Current insulin pump: T-insulin as of 07/20/2019  Basal rates: 0.3 until 3 PM, 3 PM-12 AM = 0.35, total 7.7 daily   Carb coverage 1: 20 g and correction 1: 70 Active insulin 4 hours Target 150         Recent history:    Current blood sugar patterns, difficulties with management and problems identified:  A1c in August was relatively better at 8.1  Last visit was in 02/2020  She has significant variability in her blood sugars again with standard deviation as much as 105 at any given segment of the day  She is under the impression that if she puts on the sleep mode that she will not have hypoglycemia and will do this sporadically at different times of the day or night  However in the last 2 weeks she has not had any hypoglycemia except once recent blood sugar 55 early morning  As before she is not consistently bolusing for her meals  She is not clear why her blood sugar sometimes plateau at 400+ and continue to be high for several hours She is changing her cartridge and tubing every 9 days or so since she is using relatively small amounts of insulin  Postprandial sugars are difficult to assess since she appears to have variable responses  Currently with mostly using correction boluses her bolus insulin is about 52% of her daily insulin  CGM interpretation as follows: HYPERGLYCEMIA is present almost on a daily basis at variable times but blood sugars are generally averaging above target at all times except between 6 AM-11 AM  high degree of variability present with  best blood sugars around 10-11 AM and highest blood sugars 2-4 PM on an average Blood sugars are above target all the time except between about 9 AM-11 AM when they are about 150-170 Otherwise blood sugars averaging in different segments of the day as below Since boluses are not being entered for all meals difficult to know whether the hyperglycemia is occurring from food but tends to have high blood sugars including some times persistently high readings for several hours around 400+ at different times  overnight blood sugars are highly variable but occasionally fairly good and stable     CGM use % of time 59  2-week average/SD 258/101  Time in range    30    %  % Time Above 180 31  % Time above 300 38  % Time Below 70 1      PRE-MEAL  overnight  mornings  afternoon  evening Overall  Glucose range:       Averages: 280 173 281 2 98                  Previously: CGM use % of time 93  2-week average/SD 255  Time in range       30%  % Time Above 180  30  % Time above 250  39  % Time Below 70 1      PRE-MEAL  overnight  morning  afternoon  evening Overall  Glucose range:  50-400  Mean/median:  256  184, was 176  304, was 272  278, was 209  255     Previous data:  CGM use % of time   Average and SD  217+/-99  Time in range      40%  % Time Above 180  58  % Time above 250   % Time Below target 2    PRE-MEAL  overnight  morning  afternoon  evening Overall  Glucose range:      58-400  Mean/median:  199  176  272  209  217+/-99    Glycemic patterns summary: Blood sugars are showing marked fluctuation especially in the afternoons is also around 2 AM blood sugars are above the target throughout the day except between about 8 AM-10 AM Overall HIGHEST blood sugars are between 12 PM and 5 PM No significant hypoglycemia  Hyperglycemic episodes are occurring mostly in the afternoons but also sporadically at all other times including overnight  Hypoglycemic episodes have  been minimal with mostly low normal readings as above  Overnight periods: Blood sugars are extremely variable and averaging mostly between 180-210 without significant hypoglycemia  Preprandial periods: She has no psych mealtimes and bolus information is frequently missing  Postprandial periods:   Very few bolus events documented for meals, last evening blood sugar did go up significantly higher after the bolus but previously did come back to normal with high readings   Previous data:  PRE-MEAL  overnight  morning  afternoon  evening Overall  Glucose range:      82-400  Mean/median:  279  158  217  248  238+/-97      Meals: eating at variable times and also having snacks periodically.  Her daily carbohydrate intake is quite variable Physical activity: exercise: none         Dietician visit: Most recent:?          Complications: are: Neuropathy, nephropathy, diabetic foot ulcer, amputation    Wt Readings from Last 3 Encounters:  01/13/21 126 lb 9.6 oz (57.4 kg)  12/01/20 139 lb 12.4 oz (63.4 kg)  11/02/20 134 lb (60.8 kg)   Lab Results  Component Value Date   HGBA1C 8.1 (A) 11/02/2020   HGBA1C 9.4 (A) 02/17/2020   HGBA1C 8.4 10/22/2019   Lab Results  Component Value Date   MICROALBUR 304.2 (H) 11/11/2017   LDLCALC 78 05/02/2016   CREATININE 8.34 (H) 12/01/2020   No visits with results within 1 Week(s) from this visit.  Latest known visit with results is:  No results displayed because visit has over 200 results.      Other problems addressed:: See review of systems   Allergies as of 01/13/2021       Reactions   Cleocin [clindamycin Hcl] Diarrhea   Lisinopril Other (See Comments)   Elevated potassium per pt report   Amoxicillin Diarrhea, Nausea Only, Other (See Comments)   Did it involve swelling of the face/tongue/throat, SOB, or low BP? No Did it involve sudden or severe rash/hives, skin peeling, or any reaction on the inside of your mouth or nose? No Did  you need to seek medical attention at a hospital or doctor's office? No When did it last happen?      10 + years If all above answers are "NO", may proceed with cephalosporin use.   Bactrim [sulfamethoxazole-trimethoprim] Diarrhea, Nausea Only        Medication List        Accurate  as of January 13, 2021  3:11 PM. If you have any questions, ask your nurse or doctor.          albuterol 108 (90 Base) MCG/ACT inhaler Commonly known as: VENTOLIN HFA Inhale 2 puffs into the lungs every 6 (six) hours as needed for wheezing or shortness of breath.   amLODipine 5 MG tablet Commonly known as: NORVASC Take 5 mg by mouth daily.   aspirin EC 81 MG tablet Take 81 mg by mouth daily.   B-12 5000 MCG Caps Take 5,000 mcg by mouth daily.   calcitRIOL 0.5 MCG capsule Commonly known as: ROCALTROL Take 1 capsule daily   calcium acetate 667 MG capsule Commonly known as: PHOSLO Take 2 capsules (1,334 mg total) by mouth 3 (three) times daily with meals.   Contour Next Test test strip Generic drug: glucose blood USE AS INSTRUCTED TO CHECK BLOOD SUGAR 4 TIMES A DAY. DX E10.65   Dexcom G6 Receiver Devi 1 each by Does not apply route See admin instructions. Use Dexcom Receiver to monitor blood sugar continuously.   Dexcom G6 Sensor Misc 1 each by Does not apply route See admin instructions. Use one sensors once every 10 days to monitor blood sugars.   Dexcom G6 Transmitter Misc 1 each by Does not apply route See admin instructions. Use one transmitter once every 90 days.   DIALYVITE 800 WITH ZINC 0.8 MG Tabs Take 1 tablet by mouth daily.   diphenhydrAMINE 25 MG tablet Commonly known as: BENADRYL Take 25 mg by mouth daily as needed.   folic acid 277 MCG tablet Commonly known as: FOLVITE Take 400 mcg by mouth daily.   heparin 1000 unit/ml Soln injection Heparin Sodium (Porcine) 1,000 Units/mL Catheter Lock Arterial   insulin aspart 100 UNIT/ML injection Commonly known as:  NovoLOG USE A MAX OF 65 UNITS DAILY VIA INSULIN PUMP   insulin aspart 100 UNIT/ML injection Commonly known as: NovoLOG Inject 16 Units into the skin once for 1 dose. USE A MAX OF 65 UNITS DAILY VIA INSULIN PUMP   insulin pump Soln Inject into the skin. Insulin aspart (Novolog) 100 unit/ml---MAX 65 UNITS DAILY   lidocaine-prilocaine cream Commonly known as: EMLA Apply 1 application topically daily as needed. Port access   norethindrone 5 MG tablet Commonly known as: AYGESTIN Take 5 mg by mouth See admin instructions. 5mg  daily for 14 days, then off for 14 days, then repeat cycle.   Omega 3 500 500 MG Caps Take 500 mg by mouth daily.   ondansetron 4 MG disintegrating tablet Commonly known as: Zofran ODT Take 1 tablet (4 mg total) by mouth every 8 (eight) hours as needed for nausea or vomiting.   pravastatin 40 MG tablet Commonly known as: PRAVACHOL Take 1 tablet (40 mg total) by mouth daily.        Allergies:  Allergies  Allergen Reactions   Cleocin [Clindamycin Hcl] Diarrhea   Lisinopril Other (See Comments)    Elevated potassium per pt report   Amoxicillin Diarrhea, Nausea Only and Other (See Comments)    Did it involve swelling of the face/tongue/throat, SOB, or low BP? No Did it involve sudden or severe rash/hives, skin peeling, or any reaction on the inside of your mouth or nose? No Did you need to seek medical attention at a hospital or doctor's office? No When did it last happen?      10 + years If all above answers are "NO", may proceed with cephalosporin use.  Bactrim [Sulfamethoxazole-Trimethoprim] Diarrhea and Nausea Only    Past Medical History:  Diagnosis Date   Anemia    Arthritis    Bronchitis    C. difficile diarrhea 09/26/2014   Cataracts, both eyes    had surgery to remove   Cellulitis of right foot 06/02/2014   CKD (chronic kidney disease) stage 3, GFR 30-59 ml/min (HCC)    Dialysis T, TH, Sat   Diabetic ulcer of right foot ([place]) 06/02/2014    DVT (deep venous thrombosis) ([place]) 10/2014   one in each one in leg- PICC line , one in right and left arm   H/O seasonal allergies    History of blood transfusion    Hypercholesteremia    Hypertension    Left foot infection    Neuromuscular disorder (HCC)    lower legs and feet   Neuropathy in diabetes [facility])    Peripheral vascular disease (HCC)    Sleep apnea    does not use cpap   Staphylococcus aureus bacteremia 10/2014   Type 1 diabetes ([place])    onset age 79    Past Surgical History:  Procedure Laterality Date   AMPUTATION TOE Left 07/24/2015   5th toe and Hallux amputation   AV FISTULA PLACEMENT Left 08/06/2018   Procedure: INSERTION OF BASCILIC VEN TRANSPOSITION 1ST STAGE;  Surgeon: Rosetta Posner, MD;  Location: [facility];  Service: Vascular;  Laterality: Left;   [place] Left 04/29/2019   Procedure: SECOND STAGE BASCILIC VEIN TRANSPOSITION LEFT ARM;  Surgeon: Rosetta Posner, MD;  Location: [facility];  Service: Vascular;  Laterality: Left;   CESAREAN SECTION     x 1   EXOSTECTECTOMY TOE Left 04/11/2016   Procedure: EXOSTECTECTOMY CUBOID AND 5TH METATARSAL AND PLACEMENT OF ANTIBIOTIC BEADS;  Surgeon: Edrick Kins, DPM;  Location: [place];  Service: Podiatry;  Laterality: Left;   EYE SURGERY Bilateral    removed cataracts - laser   FEMORAL-POPLITEAL BYPASS GRAFT Left 05/03/2015   FOOT AMPUTATION THROUGH METATARSAL Right 2016   hemrrhoidectomy     I & D EXTREMITY Right 06/10/2014   Procedure: IRRIGATION AND DEBRIDEMENT Right Foot;  Surgeon: Newt Minion, MD;  Location: [place];  Service: Orthopedics;  Laterality: Right;   I & D EXTREMITY Right 07/31/2018   Procedure: IRRIGATION AND DEBRIDEMENT EXTREMITY;  Surgeon: Edrick Kins, DPM;  Location: [place];  Service: Podiatry;  Laterality: Right;   I & D EXTREMITY Right 08/04/2018   Procedure: IRRIGATION AND DEBRIDEMENT FOOT with placement of wound vac;  Surgeon: Edrick Kins, DPM;  Location: [place];  Service: Podiatry;   Laterality: Right;   INCISION AND DRAINAGE Left 04/11/2016   Procedure: INCISION AND DRAINAGE A DEEP COMPLICATED WOUND OF LEFT FOOT;  Surgeon: Edrick Kins, DPM;  Location: [place];  Service: Podiatry;  Laterality: Left;   INSERTION OF DIALYSIS CATHETER Right 08/06/2018   Procedure: INSERTION OF TUNNELED DIALYSIS CATHETER;  Surgeon: Rosetta Posner, MD;  Location: [facility];  Service: Vascular;  Laterality: Right;   IR FLUORO GUIDE CV LINE RIGHT  09/24/2017   IR FLUORO GUIDE CV LINE RIGHT  08/01/2018   IR REMOVAL TUN CV CATH W/O FL  11/22/2017   IR US GUIDE VASC ACCESS RIGHT  09/24/2017   IR US GUIDE VASC ACCESS RIGHT  08/01/2018   IRRIGATION AND DEBRIDEMENT ABSCESS Left 05/06/2017   Procedure: IRRIGATION AND DEBRIDEMENT ABSCESS LEFT FOOT;  Surgeon: Edrick Kins, DPM;  Location: [facility];  Service: Podiatry;  Laterality: Left;   PERIPHERAL VASCULAR BALLOON ANGIOPLASTY Left 01/20/2020   Procedure: PERIPHERAL VASCULAR BALLOON ANGIOPLASTY;  Surgeon: Marty Heck, MD;  Location: [place] [facility];  Service: Cardiovascular;  Laterality: Left;  Arm fistula   PERIPHERALLY INSERTED CENTRAL CATHETER INSERTION     SKIN GRAFT Right 06/15/2014   SKIN GRAFT Left 06/2015   foot    SKIN SPLIT GRAFT Right 06/15/2014   Procedure: SPLIT THICKNESS SKIN GRAFT RIGHT FOOT;  Surgeon: Newt Minion, MD;  Location: [place];  Service: Orthopedics;  Laterality: Right;   TRANSMETATARSAL AMPUTATION Right    TRANSMETATARSAL AMPUTATION Right 09/11/2014   WISDOM TOOTH EXTRACTION      Family History  Problem Relation Age of Onset   Hyperlipidemia Mother    Dementia Mother     Social History:  reports that she quit smoking about 12 years ago. Her smoking use included cigarettes. She has never used smokeless tobacco. She reports that she does not currently use alcohol. She reports that she does not use drugs.  Review of Systems:   Hypertension/CKD:   Is on dialysis  She is on amlodipine  BP Readings from Last 3 Encounters:   01/13/21 128/76  12/01/20 140/63  11/02/20 (!) 144/82    Lipids: Has been  treated with pravastatin 40 mg  Followed by PCP   Lab Results  Component Value Date   CHOL 152 06/12/2018   HDL 24.40 (L) 06/12/2018   LDLCALC 78 05/02/2016   LDLDIRECT 82.0 06/12/2018   TRIG 222.0 (H) 06/12/2018   CHOLHDL 6 06/12/2018      Chronic anemia, followed by nephrologist  Lab Results  Component Value Date   WBC 5.8 11/30/2020   HGB 9.1 (L) 11/30/2020   HCT 27.7 (L) 11/30/2020   MCV 92.0 11/30/2020   PLT 280 11/30/2020    She has a Charcot foot, 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      ASSESSMENT/ PLAN:   Diabetes type 1 with poor control and multiple complications      See history of present illness for detailed discussion of current diabetes management, blood sugar patterns and problems identified  A1c is 8.1 most recently  She has been using closed-loop insulin pump with the T-Slim device and Dexcom  She has not been seen in follow-up for almost a year As before her blood sugars are still overall poorly controlled and highly variable Her time in range is only 30% compared to 40% last year  She has hyperglycemia related to generally inadequate basal insulin, sometimes having late or missed boluses and not taking correction boluses when blood sugars are persistently high May also have high readings because of not changing her infusion set except when her insulin runs out every 9 days or so She does not understand that using the sleep mode lowers her blood sugar target only control IQ is and she thinks this is to safeguard against hypoglycemia  Hypoglycemia has been generally minimal with lowest blood sugars early morning and lower when she is in the sleep mode  PLAN:    She will stop using the sleep mode and  explained in detail that this should be helpful in controlling high  blood sugars and also reducing chances of low sugars with tighter control  BASAL rates were changed in the office as follows 12 AM = 0.4, 3 AM = 0.3, 8 AM = 0.375 and 3 PM = 0.45  Correction factor I: 60 She will need to make boluses for high sugars if blood sugars are consistently staying over 300 She does need to try and change her infusion set at least once a week and may need to only fill the cartridge to the minimum, likely needs to be seen by the diabetes educator for further instructions Needs more consistent follow-up  Patient Instructions  DO NOT USE SLEEP MODE If sugar stays over 300 do corections      Kadasia Kassing 01/13/2021, 3:11 PM      Elayne Snare 01/13/21

## 2021-01-17 ENCOUNTER — Other Ambulatory Visit: Payer: Self-pay

## 2021-01-17 ENCOUNTER — Encounter (HOSPITAL_BASED_OUTPATIENT_CLINIC_OR_DEPARTMENT_OTHER): Payer: Medicare HMO | Attending: Internal Medicine | Admitting: Internal Medicine

## 2021-01-17 DIAGNOSIS — M86172 Other acute osteomyelitis, left ankle and foot: Secondary | ICD-10-CM | POA: Diagnosis not present

## 2021-01-17 DIAGNOSIS — L97514 Non-pressure chronic ulcer of other part of right foot with necrosis of bone: Secondary | ICD-10-CM | POA: Insufficient documentation

## 2021-01-17 DIAGNOSIS — L97522 Non-pressure chronic ulcer of other part of left foot with fat layer exposed: Secondary | ICD-10-CM | POA: Diagnosis not present

## 2021-01-17 DIAGNOSIS — E10621 Type 1 diabetes mellitus with foot ulcer: Secondary | ICD-10-CM | POA: Insufficient documentation

## 2021-01-17 NOTE — Progress Notes (Signed)
ANACLARA, ACKLIN ([id number]) Visit Report for 01/17/2021 Debridement Details Patient Name: Date of Service: [name] 01/17/2021 1:30 PM Medical Record Number: [id number] Patient Account Number: [id number] Date of Birth/Sex: Treating RN: 02-May-1970 (50 y.o. [name], [name] Primary Care Provider: [name], [name] Other Clinician: Referring Provider: Treating Provider/Extender: [name], [name] Weeks in Treatment: 123 Debridement Performed for Assessment: Wound #52 Right Foot Performed By: Physician Ricard Dillon., MD Debridement Type: Debridement Severity of Tissue Pre Debridement: Fat layer exposed Level of Consciousness (Pre-procedure): Awake and Alert Pre-procedure Verification/Time Out Yes - 14:00 Taken: Start Time: 14:00 Pain Control: Lidocaine T Area Debrided (L x W): otal 2.7 (cm) x 4 (cm) = 10.8 (cm) Tissue and other material debrided: Viable, Non-Viable, Slough, Subcutaneous, Skin: Dermis , Skin: Epidermis, Slough Level: Skin/Subcutaneous Tissue Debridement Description: Excisional Instrument: Curette Specimen: Tissue Culture Number of Specimens T aken: 1 Bleeding: Minimum Hemostasis Achieved: Pressure End Time: 14:00 Procedural Pain: 0 Post Procedural Pain: 0 Response to Treatment: Procedure was tolerated well Level of Consciousness (Post- Awake and Alert procedure): Post Debridement Measurements of Total Wound Length: (cm) 2.7 Width: (cm) 4 Depth: (cm) 0.1 Volume: (cm) 0.848 Character of Wound/Ulcer Post Debridement: Improved Severity of Tissue Post Debridement: Fat layer exposed Post Procedure Diagnosis Same as Pre-procedure Electronic Signature(s) Signed: 01/17/2021 4:50:06 PM By: Rhae Hammock RN Signed: 01/17/2021 4:56:56 PM By: Linton Ham MD Entered By: Rhae Hammock on 01/17/2021 14:04:36 -------------------------------------------------------------------------------- HPI Details Patient  Name: Date of Service: [name] 01/17/2021 1:30 PM Medical Record Number: [id number] Patient Account Number: [id number] Date of Birth/Sex: Treating RN: 08-11-1970 (50 y.o. [name], [name] Primary Care Provider: [name], [name] Other Clinician: Referring Provider: Treating Provider/Extender: [name], [name] Weeks in Treatment: 71 History of Present Illness HPI Description: When46 year old diabetic who is known to have type 1 diabetes which is poorly controlled last hemoglobin A1c was 11%. She comes in with a ulcerated area on the left lateral foot which has been there for over 6 months. Was recently she has been treated by [name] of podiatry who saw her last on 05/28/2016. Review of his notes revealed that the patient had incision and drainage with placement of antibiotic beads to the left foot on 04/11/2016 for possible osteomyelitis of the cuboid bone. Over the last year she's had a history of amputation of the left fifth toe and a femoropopliteal popliteal bypass graft somewhere in April 2017. 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 His note [name] mentions that the patient has been referred to me for further wound care and possibly great candidate for hyperbaric oxygen therapy due to recurrent osteomyelitis. However we do not have any x-rays of biopsy reports confirming this. He has been on several antibiotics including Bactrim and most recently is on doxycycline for an MRSA. I understand, the patient was not a candidate for IV antibiotics as she has had previous PICC lines which resulted in blood clots in both arms. There was a x-ray report dated 04/04/2016 on [name] notes which showed evidence of fifth ray resection left foot with osteolytic changes noted to the fourth metatarsal and cuboid bone on the left. 06/13/2016 -- had a left foot x-ray which showed no acute fracture or dislocation and no definite radiographic evidence  of osteomyelitis. Advanced osteopenia was seen. 06/20/2016 -- she has noticed a new wound on the right plantar foot in the region  where she had a callus before. 06/27/16- the patient did have her x-ray of the right foot which showed no findings to suggest osteomyelitis. She saw her endocrinologist, [name], yesterday. Her A1c in January was 11. He also indicates mismanagement and noncompliance regarding her diabetes. She is currently on Bactrim for a lip infection. She is complaining of nausea, vomiting and diarrhea. She is unable to articulate the exact orders or dosing of the Bactrim; it is unclear when she will complete this. 07/04/2016 -- results from [facility] of ABIs with ankle waveforms were noted from 02/14/2016. The examination done on 06/27/2015 showed noncompressible ABIs with the right being 1.45 and the left being 1.33. The present examination showed a right ABI of 1.19 on the left of 1.33. The conclusion was that right normal ABI in the lower extremity at rest however compared to previous study which was noncompressible ABI may be falsely elevated side suggesting medial calcification. The left ABI suggested medial calcification. 08/01/2016 -- the patient had more redness and pain on her right foot and did not get to come to see as noted she see her PCP or go to the ER and decided to take some leftover metronidazole which she had at home. As usual, the patient does report she feels and is rather noncompliant. 08/08/2016 -- -- x-ray of the right foot -- FINDINGS:Transmetatarsal amputation is noted. No bony destruction is noted to suggest osteomyelitis. IMPRESSION: No evidence of osteomyelitis. Postsurgical changes are seen. MRI would be more sensitive for possible bony changes. Culture has grown Serratia Marcescens -- sensitive to Bactrim, ciprofloxacin, ceftazidime she was seen by [name] on 08/06/2016. He did not find any exposed bone, muscle, tendon, ligament or joint.  There was no malodor and he did a excisional debridement in the office. ============ Old notes: 50 year old patient who is known to the wound clinic for a while had been away from the wound clinic since 09/01/2014. Over the last several months she has been admitted to various hospitals including [facility] [facility]. She was treated for a right metatarsal osteomyelitis with a transmetatarsal amputation and this was done about 2 months ago. He has a small ulcerated area on the right heel and she continues to have an ulcerated area on the left plantar aspect of the foot. The patient was recently admitted to the [facility] hospital group between 7/12 and 10/18/2014. she was given 3 weeks of IV vancomycin and was to follow-up with her surgeons at [facility] and also took oral vancomycin for C. difficile colitis. 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 MRI of the left foot done on 10/14/2014 showed no abscess or osteomyelitis. 04/27/15; this is a patient we know from previous stays in the wound care center. She is a type I diabetic I am not sure of her control currently. Since the last time I saw her she is had a right transmetatarsal amputation and has no wounds on her right foot and has no open wounds. She is been followed at the wound care center at [facility] in [place]. She comes today with the desire to undergo hyperbaric treatment locally. Apparently one of her wound care providers in [place] has suggested hyperbarics. This is in response to an MRI from 04/18/15 that showed increased marrow signal and loss  of the proximal fifth metatarsal cortex evidence of osteomyelitis with likely early osteomyelitis in  the cuboid bone as well. She has a large wound over the base of the fifth metatarsal. She also has a eschar over her the tips of her toes on 1,3 and 5. She does not have peripheral pulses and apparently is going for an angiogram tomorrow which seems reasonable. After this she is going to infectious disease at [facility]. They have been using Medihoney to the large wound on the lateral aspect of the left foot to. The patient has known Charcot deformity from diabetic neuropathy. She also has known diabetic PAD. Surprisingly I can't see that she has had any recent antibiotics, the patient states the last antibiotic she had was at the end of November for 10 days. I think this was in response to culture that showed group G strep although I'm not exactly sure where the culture was from. She is also had arterial studies on 03/29/15. This showed a right ABI of 1.4 that was noncompressible. Her left ABI was 0.73. 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 It was not felt that arterial inflow was adequate for healing of a foot ulcer. Her Doppler waveforms looked monophasic ===== READMISSION 02/28/17; this is in an now 50 year old woman we've had at several different occasions in this clinic. 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 She has a remote ex-smoker. She was last seen in this clinic by Dr. Con Memos I think in May. More recently she is been followed by her podiatrist [name] an infectious disease Dr. Megan Salon. She has 2 open wounds the major one is over the right first metatarsal head she also has a wound on the left plantar foot. an MRI of the right foot on 01/01/17 showed a soft tissue ulcer along the plantar aspect of the first metatarsal base consistent with osteomyelitis of the first metatarsal stump. Dr. 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 According to  the patient this is been open for slightly over a month. She has been on a combination of Cipro 500 twice a day, Zyvox 600 twice a day and Flagyl 500 3 times a day for over a month now as directed by Dr. Megan Salon. cultures of the right foot earlier this year showed MRSA in January and Serratia in May. January also had a few viridans strep. Recent x-rays of both feet were done and [name] office and I don't have these reports. The patient has known PAD and has a history of aleft femoropopliteal bypass in April 2017. 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 Dwyane Dee. In spite of this the last hemoglobin A1c I can see is 10.1 on 01/01/2017. She is being referred by 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 She is been using Medihoney to this area and also an area on the left midfoot. She is using healing sandals bilaterally. ABIs in this clinic at the left posterior tibial was 1.1 noncompressible on the right READMISSION Non invasive vascular NOVANT 5/18 Aftercare following surgery of the circulatory system Procedure Note - Interface, External Ris In - 08/13/2016 11:05 AM EDT Procedure: Examination consists of physiologic  resting arterial pressures of the brachial and ankle arteries bilaterally with continuous wave Doppler waveform analysis. Previous: Previous exam performed on 02/14/16 demonstrated ABIs of Rt = 1.19 and Lt = 1.33. Right: ABI = non-compressible PT 1.47 DP. S/P transmet amputation. , Left: ABI = 1.52, 2nd digit pressure = 87 mmHg Conclusions: Right: ABI (>1.3) may be falsely elevated, suggesting medial calcification. Left: ABI (>1.3) may be falsely elevated, suggesting medial calcification The patient is a now 50 year old type I  diabetic is had multiple issues her graded to chronic diabetic foot ulcers. 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 We had her in the clinic lastin November. At that point she had wounds on her bilateral feet.she had wanted to try hyperbarics however the healogics review process denied her because she hadn't followed up with her vascular surgeon for her left femoropopliteal bypass. The bypass was done by Dr. Raul Del at [facility]. We made her a follow-up with Dr. Raul Del however she did not keep the appointment and therefore she was not approved The patient shows me a small wound on her left fourth metatarsal head on her phone. She developed rapid discoloration in the plantar aspect of the left foot and she was admitted to hospital from 2/2 through 05/10/17 with wet gangrene of the left foot osteomyelitis of the fourth metatarsal heads. She was admitted acutely ill with a temperature of 103. She was started on broad-spectrum vancomycin and cefepime. On 05/06/17 she was taken to the OR by [name] her podiatric surgeon for an incision and drainage irrigation of the left foot wound. Cultures from this surgery revealed group be strep and anaerobes. she was seen by Dr.Xu of orthopedic surgery and scheduled for a below-knee amputation which she u refused. Ultimately she was discharged on Levaquin and Flagyl for one month. MRI 05/05/17 done while she was in the hospital showed abscess adjacent to the fourth metatarsal head and neck small abscess around the fourth flexor tendon. Inflammatory phlegmon and gas in the soft tissues along the lateral aspect of the fourth phalanx. Findings worrisome for osteomyelitis involving the fourth proximal and middle phalanx and also the third and fourth metatarsals. Finally the patient had actually shortly before this followed up with Dr. Raul Del at no time on 04/29/17. He felt that her left  femoropopliteal bypass was patent he felt that her left-sided toe pressures more than adequate for healing a wound on the left foot. This was before her acute presentation. 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 05/28/17; she is started hyperbarics. The patient tells me that for some reason she was not actually on Levaquin but I think on ciprofloxacin. She was on Flagyl. She only started her Levaquin yesterday due to some difficulty with the pharmacy and perhaps her sister picking it up. She has an appointment with [name] tomorrow and with infectious disease early next week. She has no new complaints 06/06/17; the patient continues in hyperbarics. She saw [name] on 05/29/17 who is her podiatric surgeon. He is elected for a transmetatarsal amputation on 06/27/17. I'm not sure at what level he plans to do this amputation. The patient is unaware She also saw Dr. Megan Salon of infectious disease who elected to continue her on current antibiotics I think this is ciprofloxacin and Flagyl. I'll need to clarify with her tomorrow if she actually has this. We're using silver alginate to the actual wound. Necrotic surface today with material under the flap of her  foot. Original MRI showed abscesses as well as osteomyelitis of the proximal and middle fourth phalanx and the third and fourth metatarsal heads 06/11/17; patient continues in hyperbarics and continues on oral antibiotics. She is doing well. The wound looks better. The necrotic part of this under the flap in her superior foot also looks better. she is been to see [name]. I haven't had a chance to look at his note. Apparently he has put the transmetatarsal amputation on hold her request it is still planning to take her to the OR for debridement and product application ACEL. I'll see if I can find his note. I'll therefore leave product ordering/requests to [name] for now. I was going to look at Dermagraft 06/18/17-she is here in follow-up  evaluation for bilateral foot wounds. She continues with hyperbaric therapy. She states she has been applying manuka honey to the right plantar foot and alternate manuka honey and silver alginate to the left foot, despite our orders. We will continue with same treatment plan and she will follo up next week. 06/25/17; I have reviewed [name] last note from 3/11. She has operative debridement in 2 days' time. By review his note apparently they're going to place there is skin over the majority of this wound which is a good choice. She has a small satellite area at the most proximal part of this wound on the left plantar foot. The area on the right plantar foot we've been using silver alginate and it is close to healing. 07/02/17; unfortunately the patient was not easily approved for [name] proposed surgery. I'm not completely certain what the issue is. She has been using silver alginate to the wound she has completed a first course of hyperbarics. She is still on Levaquin and Flagyl. I have really lost track of the time course here.I suspect she should have another week to 2 of antibiotics. I'll need to see if she is followed up with infectious disease Dr. Megan Salon 07/09/17; the patient is followed up with Dr. Megan Salon. 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 She continues on Levaquin and metronidazole continuing both of these for now I think she is been on fr about 6 weeks. She still has some drainage but no pain. No fever. Her had been plans for her to go to the OR for operative debridement with her podiatrist [name], I am not exactly sure where that is. I'll probably slip a note to [name] today. I note that she follows with Dr. Dwyane Dee of endocrinology. We have her recertified for hyperbaric oxygen. I have not heard about Dermagraft however I'll see if [name] is planning a skin substitute as well 07/16/17; the patient tells me she is just about out of  [place]. I'll need to check Dr. Hale Bogus last notes on this. She states she has plenty of Flagyl however. She comes in today complaining of pain in the right lateral foot which she said lasted for about a day. The wound on the right foot is actually much more medially. She also tells me that the [facility] cost a lot of pain in the left foot wound and she turned back to silver alginate. Finally Dermagraft has a $102 per application co-pay. She cannot afford this 07/23/17; patient arrives today with the wound not much smaller. There is not much new to add. She has not heard from [name] all try to put in a call to them  today. She was asking about Dermagraft again and she has an over $106 per application co-pay she states that she would be willing to try to do a payment plan. I been tried to avoid this. We've been using silver alginate, I'll change to [facility] 07/30/17-She is here in follow-up evaluation for left foot ulcer. She continues hyperbaric medicine. The left foot ulcer is stable we will continue with same treatment plan 08/06/17; she is here for evaluation of her left foot ulcer. Currently being treated for hyperbarics or underlying osteomyelitis. She is completed antibiotics. The left foot ulcer is better smaller with healthier looking granulation. For various reasons I am not really clear on we never got her back to the OR with [name]. He did not respond to my secure text message. Nevertheless I think that surgery on this point is not necessary nor am I completely clear that a skin substitute is necessary The patient is complaining about pain on the outside of her right foot. 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 There is no erythema. She also states the foot is warm versus her other part of her upper leg and this is largely true. It is not totally clear to me what's causing this. She thinks it's different from her usual neuropathy pain 08/13/17; she arrives in  clinic today with a small wound which is superficial on her right first metatarsal head. 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 She tells [place] she was up on her feet over the Mother's Day celebration. The large wound is on the left foot. Continues with hyperbarics for underlying osteomyelitis. We're using Hydrofera Blue. She asked me today about where we were with Dermagraft. I had actually excluded this because of the co-pay however she wants to assume this therefore I'll recheck the co-pay an order for next week. 08/20/17; the patient agreed to accept the co-pay of the first Apligraf which we applied today. She is disappointed she is finishing hyperbarics will run this through the insurance on the extent of the foot infection and the extent of the wound that she had however she is already had 60 dive's. Dermagraft No. 1 08/27/17; Dermagraft No. 2. She is not eligible for any more hyperbaric treatments this month. She reports a fair amount of drainage and she actually changed to the external dressings without disturbing the direct contact layer 09/03/17; the patient arrived in clinic today with the wound superficially looking quite healthy. Nice vibrant red tissue with some advancing epithelialization although not as much adherence of the flap as I might like. However she noted on her own fourth toe some bogginess and she brought that to our attention. Indeed this was boggy feeling like a possibility of subcutaneous fluid. 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 She is not been unwell. We've been using Dermagraft 09/10/17; the culture that I did not last week was MRSA. She saw Dr. Megan Salon this morning who is going to start her on vancomycin. I had sent him a secure a text message yesterday. I also spoke with her podiatric surgeon [name] about surgery on this foot the options for conserving a functional foot  etc. Promised me he would see her and will make back consultation today. Paradoxically her actual wound on the plantar aspect of her left foot looks really quite good. I had given her 5 days worth of Baxdella to cover her  for MRSA. Her MRI came back showing osteomyelitis within the third metatarsal shaft and head and base of the third and fourth proximal phalanx. She had extensive inflammatory changes throughout the soft tissue of the lateral forefoot. With an ill-defined fluid around the fourth metatarsal extending into the plantar and dorsal soft tissues 09/19/17; the patient is actually on oral Septra and Flagyl. She apparently refused IV vancomycin. She also saw [name] at my request who is planning her for a left BKA sometime in mid July. MRI showed osteomyelitis within the third metatarsal shaft and head and the basis of the third and fourth proximal phalanx. I believe there was felt to be possible septic arthritis involving the third MTP. 09/26/17; the patient went back to Dr. Megan Salon at my suggestion and is now receiving IV daptomycin. Her wound continues to look quite good making the decision to proceed with a transmetatarsal amputation although more difficult for the patient. I believe in my extensive discussions with her she has a good sense of the pros and cons of this. I don't NV the tuft decision she has to make. She has an appointment with [name] I believe in mid July and I previously spoken to him about this issue Has we had used 3 previous Dermagraft. Given the condition of the wound surface I went ahead and added the fourth one today area and I did this not fully realizing that she'll be traveling to [place] next week. I'm hopeful she can come back in 2 weeks 10/21/17; Her same Dermagraft on for about 3-1/2 weeks. In spite of this the wound arrives looking quite healthy. There is been a lot of healing dimensions are smaller. Looking at the square shaped wound she has now there  is some undermining and some depth medially under the undermining although I cannot palpate any bone. No surrounding infection is obvious. She has difficult questions about how to look at this going forward vis--vis amputations versus continued medical therapy. T be truthful the wound is looks o so healthy and it is continued to contract. Hard to justify foot surgery at this point although I still told her that I think it might come to that if we are not able to eradicate the underlying MRSA. She is still highly at risk and she understands this 11/06/17 on evaluation today patient appears to be doing better in regard to her foot ulcer. She's been tolerating the dressing changes without complication. Currently she is here for her Dermagraft #6. Her wound continues to make excellent progress at this point. She does not appear to have any evidence of infection which is good news. 11/13/17 on evaluation today patient appears to be doing excellent at this time. She is here for repeat Dermagraft application. This is #7. Overall her wound seems to be making great progress. 12/05/17; the patient arrives with the wound in much better condition than when I last saw this almost 6 weeks ago. She still has a small probing area in the left metatarsal head region on the lateral aspect of her foot. We applied her last Dermagraft today. Since the last time she is here she has what appears to a been a blood blister on the plantar aspect of left foot although I don't see this is threatening. There is also a thick raised tissue on the right mid metatarsal head region. This was not there I don't think the last time she was here 3 weeks ago. 12/12/17; the patient continues to have a small  programming area in the left metatarsal head region on the lateral aspect of her foot which was the initial large surgical wound. I applied her last Apligraf last week. I'm going to use Endoform starting today Unfortunately she has an  excoriated area in the left mid foot and the right mid foot. The left midfoot looks like a blistered area this was not opened last week it certainly is open today. Using silver alginate on these areas. She promises me she is offloading this. 12/19/17; the small probing area in the left metatarsal head eyes think is shallower. In general her original wound looks better. We've been using Endoform. The area inferiorly that I think was trauma last week still requires debridement a lot of nonviable surface which I removed. She still has an open open area distally in her foot Similarly on the right foot there is tightly adherent surface debris which I removed. Still areas that don't look completely epithelialized. This is a small open area. We used silver alginate on these areas 12/26/2017; the patient did not have the supplies we ordered from last week including the Endoform. The original large wound on the left lateral foot looks healthy. She still has the undermining area that is largely unchanged from last week. She has the same heavily callused raised edged wounds on the right mid and left midfoot. Both of these requiring debridement. We have been using silver alginate on these areas 01/02/2018; there is still supply issues. We are going to try to use Prisma but I am not sure she actually got it from what she is saying. She has a new open area on the lateral aspect of the left fourth toe [previous fifth ray amputation]. Still the one tunneling area over the fourth metatarsal head. The area is in the midfoot bilaterally still have thick callus around them. She is concerned about a raised swelling on the lateral aspect of the foot. However she is completely insensate 01/10/2018; we are using Prisma to the wounds on her bilateral feet. Surprisingly the tunneling area over the left fourth metatarsal head that was part of her original surgery has closed down. She has a small open area remaining on the incision  line. 2 open areas in the midfoot. 02/10/2018; the patient arrives back in clinic after a month hiatus. She was traveling to visit family in [place]. Is fairly clear she was not offloading the areas on her feet. The original wound over the left lateral foot at the level of metatarsal heads is reopened and probes medially by about a centimeter or 2. She notes that a week ago she had purulent drainage come out of an area on the left midfoot. Paradoxically the worst area is actually on the right foot is extensive with purulent drainage. We will use silver alginate today 02/17/2018; the patient has 3 wounds one over the left lateral foot. She still has a small area over the metatarsal heads which is the remnant of her original surgical wound. This has medial probing depth of roughly 1.4 cm somewhat better than last week. The area on the right foot is larger. We have been using silver alginate to all areas. The area on the right foot and left foot that we cultured last week showed both Klebsiella and Proteus. Both of these are quinolone sensitive. The patient put her's self on Bactrim and Flagyl that she had left hanging around from prior antibiotic usages. She was apparently on this last week when she arrived. I did not  realize this. Unfortunately the Bactrim will not cover either 1 of these organisms. We will send in Cipro 500 twice daily for a week 03/04/2018; the patient has 2 wounds on the left foot one is the original wound which was a surgical wound for a deep DFU. At one point this had exposed bone. She still has an area over the fourth metatarsal head that probes about 1.4 cm although I think this is better than last week. I been using silver nitrate to try and promote tissue adherence and been using silver alginate here. She also has an area in the left midfoot. This has some depth but a small linear wound. Still requiring debridement. On the right midfoot is a circular wound. A lot of thick  callus around this area. We have been using silver alginate to all wound areas She is completed the ciprofloxacin I gave her 2 weeks ago. 03/11/2018; the patient continues to have 2 open areas on the left foot 1 of which was the original surgical wound for a deep DFU. Only a small probing area remains although this is not much different from last week we have been using silver alginate. The other area is on the midfoot this is smaller linear but still with some depth. We have been using silver alginate here as well On the right foot she has a small circular wound in the mid aspect. This is not much smaller than last time. We have been using silver alginate here as well 03/18/2018; she has 3 wounds on the left foot the original surgical wound, a very superficial wound in the mid aspect and then finally the area in the mid plantar foot. She arrives in today with a very concerning area in the wound in the mid plantar foot which is her most proximal wound. There is undermining here of roughly 1-1/2 cm superiorly. Serosanguineous drainage. She tells me she had some pain on for over the weekend that shot up her foot into her thigh and she tells me that she had a nodule in the groin area. She has the single wound in the right foot. We are using endoform to both wound areas 03/24/2018; the patient arrives with the original surgical wound in the area on the left midfoot about the same as last week. There is a collection of fluid under the surface of the skin extending from the surgical wound towards the midfoot although it does not reach the midfoot wound. The area on the right foot is about the same. Cultures from last week of the left midfoot wound showed abundant Klebsiella abundant Enterococcus faecalis and moderate methicillin resistant staph I gave her Levaquin but this would have only covered the Klebsiella. She will need linezolid 04/01/2018; she is taking linezolid but for the first few days only  took 1 a day. I have advised her to finish this at twice daily dosing. In any case all of her wounds are a lot better especially on the left foot. The original surgical wound is closed. The area on the left midfoot considerably smaller. The area on the right foot also smaller. 04/08/2018; her original surgical wound/osteomyelitis on the left foot remains closed. She has area on the left foot that is in the midfoot area but she had some streaking towards this. This is not connected with her original wound at least not visually. Small wound on the right midfoot appears somewhat smaller. 04/15/18; both wounds looks better. Original wound is better left midfoot. Using silver alginate  1/21; patient states she uses saltwater soak in, stones or remove callus from around her wounds. She is also concerned about a blood blister she had on the left foot but it simply resolved on its own. We've been using silver alginate 1/28; the patient arrives today with the same streaking area from her metatarsals laterally [the site of her original surgical wound] down to the middle of her foot. There is some drainage in the subcutaneous area here. This concerns me that there is actually continued ongoing infection in the metatarsals probably the fourth and third. This fixates an MRI of the foot without contrast [chronic renal failure] The wound in the mid part of the foot is small but I wonder whether this area actually connects with the more distal foot. The area on the right midfoot is probably about the same. Callus thick skin around the small wound which I removed with a curette we have been using silver alginate on both wound areas 2/4; culture I did of the draining site on the left foot last time grew methicillin sensitive staph aureus. MRI of the left foot showed interval resolution of the findings surrounding the third metatarsal joint on the prior study consistent with treated osteomyelitis. Chronic soft tissue  ulceration in the plantar and lateral aspect of the forefoot without residual focal fluid collection. No evidence of recurrent osteomyelitis. 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 She tells me she had nausea and vomiting over the weekend and missed 2 days. In general her foot looks much the same. She has a small open area just below the left fourth metatarsal head. A linear area in the left midfoot. Some discoloration extending from the inferior part of this into the left lateral foot although this appears to be superficial. She has a small area on the right midfoot which generally looks smaller after debridement 2/18; the patient is completing his cephalexin and has another 2 days. She continues to have open areas on the left and right foot. 2/25; she is now off antibiotics. The area on the left foot at the site of her original surgical wound has closed yet again. She still has open areas in the mid part of her foot however these appear smaller. The area on the right mid foot looks about the same. We have been using silver alginate She tells me she had a serious hypoglycemic spell at home. She had to have EMS called and get IV dextrose 3/3; disappointing on the left lateral foot large area of necrotic tissue surrounding the linear area. This appears to track up towards the same original surgical wound. Required extensive debridement. The area on the right plantar foot is not a lot better also using silver 3/12; the culture I did last time showed abundant enterococcus. I have prescribed Augmentin, should cover any unrecognized anaerobes as well. In addition there were a few MRSA and Serratia that would not be well covered although I did not want to give her  multiple antibiotics. She comes in today with a new wound in the right midfoot this is not connected with the original wound over her MTP a lot of thick callus tissue around both wounds but once again she said she is not walking on these areas 3/17-Patient  comes in for follow-up on the bilateral plantar wounds, the right midfoot and the left plantar wound. Both these are heavily callused surrounding the wounds. We are continuing to use silver alginate, she is compliant with offloading and states she uses a wheelchair fairly often at home 3/24; both wound areas have thick callus. However things actually look quite a bit better here for the majority of her left foot and the right foot. 3/31; patient continues to have thick callused somewhat irritated looking tissue around the wounds which individually are fairly superficial. There is no evidence of surrounding infection. We have been using silver alginate however I change that to [facility] today 4/17; patient returns to clinic after having a scare with Covid she tested negative in her primary doctor's office. She has been using Hydrofera Blue. She does not have an open area on the right foot. On the left foot she has a small open area with the mid area not completely viable. She showed me pictures of what looks like a hemorrhagic blister from several days ago but that seems to have healed over this was on the lateral left foot 4/21; patient comes in to clinic with both her wounds on her feet closed. However over the weekend she started having pain in her right foot and leg up into the thigh. She felt as though she was running a low-grade fever but did not take her temperature. She took a doxycycline that she had leftover and yesterday a single Septra and metronidazole. She thinks things feel somewhat better. 4/28; duplex ultrasound I ordered last week was negative for DVT or superficial thrombophlebitis. She is completed the doxycycline I gave her.  States she is still having a lot of pain in the right calf and right ankle which is no better than last week. She cannot sleep. She also states she has a temperature of up to 101, coughing and complaining of visual loss in her bilateral eyes. Apparently she was tested for Covid 2 weeks ago at [facility] and that was negative. Readmission: 09/03/18 patient presents back for reevaluation after having been evaluated at the end of April regarding erythema and swelling of her right lower extremity. Subsequently she ended up going to the hospital on 07/29/18 and was admitted not to be discharged until 08/08/18. Unfortunately it was noted during the time that she was in the hospital that she did have methicillin-resistant Staphylococcus aureus as the infection noted at the site. It was also determined that she did have osteomyelitis which appears to be fairly significant. She was treated with vancomycin and in fact is still on IV vancomycin at dialysis currently. This is actually slated to continue until 09/12/18 at least which will be the completion of the six weeks of therapy. Nonetheless based on what I'm seeing at this point I'm not sure she will be anywhere near ready to discontinue antibiotics at that time. Since she was released from the hospital she was seen by [name] who is her podiatrist on 08/27/18. His note specifically states that he is recommended that the patient needs of one knee amputation on the right as she has a life- threatening situation that can lead quickly to sepsis. The patient advised she would like to try to save her leg to which [name] apparently told her that this was against all medical advice. She also want to discontinue the Wound VAC which had been initiated due to the fact that she wasn't pleased with how the wound was looking  and subsequently she wanted to pursue applying Medihoney at that time. He stated that he did not believe that the right lower extremity was salvageable and  that the patient understood but would still like to attempt hyperbaric option therapy if it could be of any benefit. She was therefore referred back to [place] for further evaluation. He plans to see her back next week. Upon inspection today patient has a significant amount purulent drainage noted from the wound at this point. The bone in the distal portion of her foot also appears to be extremely necrotic and spongy. When I push down on the bone it bubbles and seeps purulent drainage from deeper in the end of the foot. I do not think that this is likely going to heal very well at all and less aggressive surgical debridement were undertaken more than what I believe we can likely do here in our office. 09/12/2018; I have not seen this patient since the most recent hospitalization although she was in our clinic last week. I have reviewed some of her records from a complex hospitalization. 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 There is situation was complicated by MRSA bacteremia and acute on chronic renal failure now on dialysis. She is receiving vancomycin at dialysis. We started her on Dakin's wet-to-dry last week she is changing this daily. There is still purulent drainage coming out of her foot. Although she is apparently "agreeable" to a below-knee amputation which is been suggested by multiple clinicians she wants this to be done in [place]. She apparently has a telehealth visit with that provider sometime in late [place] 6/24. I have told her I think this is probably too long. Nevertheless I could not convince her to allow a local doctor to perform BKA. 09/19/2018; the patient has a large necrotic area on the right anterior foot. She has had previous transmetatarsal amputations. Culture I did last week showed MRSA nothing else she is on vancomycin at dialysis. She has continued leaking purulent drainage out of the distal part of the large circular  wound on the right anterior foot. She apparently went to see Dr. 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 Somehow that translated into her being referred to plastic surgery for debridement of the area. I gather she basically refused amputation although I do not have a copy of Dr. Berenice Primas notes. The patient really wants to have a trial of hyperbaric oxygen. I agreed with initial assessment in this clinic that this was probably too far along to benefit however if she is going to have plastic surgery I think she would benefit from ancillary hyperbaric oxygen. The issue here is that the patient has benefited as maximally as any patient I have ever seen from hyperbaric oxygen therapy. Most recently she had exposed bone on the lateral part of her left foot after a surgical procedure and that actually has closed. She has eschared areas in both heels but no open area. She is remained systemically well. I am not optimistic that anything can be done about this but the patient is very clear that she wants an attempt. The attempt would include a wound VAC further debridements and hyperbaric oxygen along with IV antibiotics. 6/26; I put her in for a trial of hyperbaric oxygen only because of the dramatic response she has had with wounds on her left midfoot earlier this year which was a surgical wound that went straight to her bone over  the metatarsal heads and also remotely the left third toe. We will see if we can get this through our review process and insurance. She arrives in clinic with again purulent material pouring out of necrotic bone on the top of the foot distally. There is also some concerning erythema on the front of the leg that we marked. It is bit difficult to tell how tender this is because of neuropathy. I note from infectious disease that she had her vancomycin extended. 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 She had the wound VAC  on for part of the week. The rest of the time she is putting various things on this including Medihoney, "ionized water" silver sorb gel etc. 7/7; follow-up along with HBO. She is still on vancomycin at dialysis. She has a large open area on the dorsal right foot and a small dark eschar area on her heel. There is a lot less erythema in the area and a lot less tenderness. From an infection point of view I think this is better. She still has a lot of necrosis in the remaining right forefoot [previous TMA] we are still using the wound VAC in this area 7/16; follow-up along with HBO. I put her on linezolid after she finished her vancomycin. We started this last Friday I gave her 2 weeks worth. I had the expectation that she would be operatively debrided by Dr. Marla Roe but that still has not happened yet. Patient phoned the office this week. She arrives for review today after HBO. The distal part of this wound is completely necrotic. Nonviable pieces of tendon bone was still purulent drainage. Also concerning that she has black eschar over the heel that is expanding. I think this may be indicative of infection in this area as well. She has less erythema and warmth in the ankle and calf but still an abnormal exam 7/21 follow-up along with HBO. I will renew her linezolid after checking a CBC with differential monitoring her blood counts especially her platelets. She was supposed to have surgery yesterday but if I am reading things correctly this was canceled after her blood sugar was found to be over 500. I thought Dr. Marla Roe who called me said that they were sending her to the ER but the patient states that was not the case. 7/28. Follow-up along with HBO. She is on linezolid I still do not have any lab work from dialysis even though I called last week. The patient is concerned about an area on her left lateral foot about the level of the base of her fifth metatarsal. I did not really see anything  that ominous here however this patient is in [place] ability to point out problems that she is sensing and she has been accurate in the past Finally she received a call from Dr. Marla Roe who is referring her to another orthopedic surgeon stating that she is too booked up to take her to the operating room now. Was still using a wound VAC on the foot 8/3 -Follow-up after HBO, she is got another week of linezolid, she is to call ID for an appointment, x-rays of both feet were reviewed, the left foot x-ray with third MTP joint osteo- Right foot x-ray widespread osteo-in the right midfoot Right ankle x-ray does not show any active evidence of infection 8/11-Patient is seen after HBO, the wounds on the right foot appear to be about the same, the heel wound had some necrotic base over  tendon that was debrided with a curette 8/21; patient is seen after HBO. The patient's wound on her dorsal foot actually looks reasonably good and there is substantial amount of epithelialization however the open area distally still has a lot of necrotic debris partially bone. I cannot really get a good sense of just how deep this probes under the foot. She has been pressuring me this week to order medical maggots through a company in [place] for her. The problem I have is there is not a defined wound area here. On the positive side there is no purulence. She has been to see infectious disease she is still on Septra DS although I have not had a chance to review their notes 8/28; patient is seen in conjunction with HBO. The wounds on her foot continued to improve including the right dorsal foot substantially the, the distal part of this wound and the area on the right heel. We have been using a wound VAC over this chronically. She is still on trimethoprim as directed by infectious disease 9/4; patient is seen in conjunction with HBO. Right dorsal foot wound substantially anteriorly is better however she continues to have a  deep wound in the distal part of this that is not responding. We have been using silver collagen under border foam Area on the right plantar medial heel seems better. We have been using Hydrofera Blue 12/12/18 on evaluation today patient appears to be doing about the same with regard to her wound based on prior measurements. She does have some necrotic tissue noted on the lateral aspect of the wound that is going require a little bit of sharp debridement today. This includes what appears to be potentially either severely necrotic bone or tendon. Nonetheless other than that she does not appear to have any severe infection which is good news 9/18; it is been 2 weeks since I saw this wound. She is tolerating HBO well. Continued dramatic improvement in the area on the right dorsal foot. She still has a small wound on the heel that we have been using Hydrofera Blue. She continues with a wound VAC 9/24; patient has to be seen emergently today with a swelling on her right lateral lower leg. She says that she told Dr. Evette Doffing about this and also myself on a couple of occasions but I really have no recollection of this. She is not systemically unwell and her wound really looked good the last time I saw this. She showed this to providers at dialysis and she was able to verify that she was started on cephalexin today for 5 doses at dialysis. She dialyzes on Tuesday Thursday and Saturday. 10/2; patient is seen in conjunction with HBO. The area that is draining on the right anterior medial tibia is more extensive. Copious amounts of serosanguineous drainage with some purulence. We are still using the wound VAC on the original wound then it is stable. Culture I did of the original IandD showed MRSA I contacted dialysis she is now on vancomycin with dialysis treatments. I asked them to run a month 10/9; patient seen in conjunction with HBO. She had a new spontaneous open area just above the wound on the right medial  tibia ankle. More swelling on the right medial tibia. Her wound on the foot looks about the same perhaps slightly better. There is no warmth spreading up her leg but no obvious erythema. her MRI of the foot and ankle and distal tib-fib is not booked for next Friday I  discussed this with her in great detail over multiple days. it is likely she has spreading infection upper leg at least involving the distal 25% above the ankle. She knows that if I refer her to orthopedics for infectious disease they are going to recommend amputation and indeed I am not against this myself. We had a good trial at trying to heal the foot which is what she wanted along with antibiotics debridement and HBO however she clearly has spreading infection [probably staph aureus/MRSA]. Nevertheless she once again tells me she wants to wait the left of the MRI. She still makes comments about having her amputation done in [place]. 10/19; arrives today with significant swelling on the lateral right leg. Last culture I did showed Klebsiella. Multidrug-resistant. Cipro was intermediate sensitivity and that is what I have her on pending her MRI which apparently is going to be done on Thursday this week although this seems to be moving back and forth. She is not systemically unwell. We are using silver alginate on her major wound area on the right medial foot and the draining areas on the right lateral lower leg 10/26; MRI showed extensive abscess in the anterior compartment of the right leg also widespread osteomyelitis involving osseous structures of the midfoot and portions of the hindfoot. Also suspicion for osteomyelitis anterior aspect of the distal medial malleolus. Culture I did of the purulence once again showed a multidrug-resistant Klebsiella. I have been in contact with nephrology late last week and she has been started on cefepime at dialysis to replace the vancomycin We sent a copy of her MRI report to Dr. Geroge Baseman in  [place] who is an orthopedic surgeon. The patient takes great stock in his opinion on this. She says she will go to [place] to have her leg amputated if Dr. Geroge Baseman does not feel there is any salvage options. 11/2; she still is not talk to her orthopedic surgeon in [place]. Apparently he will call her at [phone] this afternoon. The quality of this is she has not allowed me to refer her anywhere. She has been told over and over that she needs this amputated but has not agreed to be referred. She tells me her blood sugar was 600 last night but she has not been febrile. 11/9; she never did got a call from the orthopedic surgeon in [place] therefore that is off the radar. We have arranged to get her see orthopedic surgery at [facility]. She still has a lot of draining purulence coming out of the new abscess in her right leg although that probably came from the osteomyelitis in her right foot and heel. Meanwhile the original wound on the right foot looks very healthy. Continued improvement. The issue is that the last MRI showed osteomyelitis in her right foot extensively she now has an abscess in the right anterior lower leg. There is nobody in [place] who will offer this woman anything but an amputation and to be honest that is probably what she needs. I think she still wants to talk about limb salvage although at this point I just do not see that. She has completed her vancomycin at dialysis which was for the original staph aureus she is still on cefepime for the more recent Klebsiella. She has had a long course of both of these antibiotics which should have benefited the osteomyelitis on the right foot as well as the abscess. 11/16; apparently [place] elective surgery is shut down because of COVID-19 pandemic. I have reached out to some  contacts at [facility] to see if we can get her an orthopedic appointment there. I am concerned about continually leaving this but for the moment  everything is static. In fact her original large wound on this foot is closing down. It is the abscess on the right anterior leg that continues to drain purulent serosanguineous material. She is not currently on any antibiotics however she had a prolonged course of vancomycin [1 month] as well as cefepime for a month 02/24/2019 on evaluation today patient appears to be doing better than the last time I saw her. This is not a patient that I typically see. With that being said I am covering for Dr. Dellia Nims this week and again compared to when I last saw her overall the wounds in particular seem to be doing significantly better which is good news. With that being said the patient tells me several disconcerting things. She has not been able to get in to see anyone for potential debridement in regard to her leg wounds although she tells me that she does not think it is necessary any longer because she is taking care of that herself. She noticed a string coming out of the lower wound on her leg over the last week. The patient states that she subsequently decided that we must of pack something in there and started pulling the string out and as it kept coming and coming she realized this was likely her tendon. With that being said she continued to remove as much of this as she could. She then I subsequently proceeded to using tubes of antibiotic ointment which she will stick down into the wound and then scored as much as she can until she sees it coming out of the other wound opening. She states that in doing this she is actually made things better and there is less redness and irritation. With regard to her foot wound she does have some necrotic tendon and tissue noted in one small corner but again the actual wound itself seems to be doing better with good granulation in general compared to my last evaluation. 12/7; continued improvement in the wound on the substantial part of the right medial foot. Still a  necrotic area inferiorly that required debridement but the rest of this looks very healthy and is contracting. She has 2 wounds on the right lateral leg which were her original drainage sites from her abscess but all of this looks a lot better as well. She has been using silver alginate after putting antibiotic biotic ointment in one wound and watching it come out the other. I have talked to her in some detail today. I had given her names of orthopedic surgeons at [facility] for second opinion on what to do about the right leg. I do not think the patient never called them. She has not been able to get a hold of the orthopedic surgeon in [place] that she had put a lot of faith in as being somebody would give her an opinion that she would trust. I talked to her today and said even if I could get her in to another orthopedic surgeon about the leg which she accept an amputation and she said she would not therefore I am not going to press this issue for the moment 12/14; continued improvement in his substantial wound on the right medial foot. There is still a necrotic area inferiorly with tightly adherent necrotic debris which I have been working on debriding each time she is here. She  does not have an orthopedic appointment. Since last time she was here I looked over her cultures which were essentially MRSA on the foot wound and gram-negative rods in the abscess on the anterior leg. 12/21; continued improvement in the area on the right medial foot. She is not up on this much and that is probably a good thing since I do not know it could support continuous ambulation. She has a small area on the right lateral leg which were remanence of the IandD's I did because of the abscess. I think she should probably have prophylactic antibiotics I am going to have to look this over to see if we can make an intelligent decision here. In the meantime her major wound is come down nicely. Necrotic area inferiorly is  still there but looks a lot better 04/06/2019; she has had some improvement in the overall surface area on the right medial foot somewhat narrowedr both but somewhat longer. The areas on the right lateral leg which were initial IandD sites are superficial. Nothing is present on the right heel. We are using silver alginate to the wound areas 1/18; right medial foot somewhat smaller. Still a deep probing area in the most distal recess of the wound. She has nothing open on the right leg. She has a new wound on the plantar aspect of her left fourth toe which may have come from just pulling skin. The patient using Medihoney on the wound on her foot under silver alginate. I cannot discourage her from this 2/1; 2-week follow-up using silver alginate on the right foot and her left fourth toe. The area on the right dorsal foot is contracted although there is still the deep area in the most distal part of the wound but still has some probing depth. No overt infection 2/15; 2-week follow-up. She continues to have improvement in the surface area on the dorsal right foot. Even the tunneling area from last time is almost closed. The area that was on the plantar part of her left fourth toe over the PIP is indeed closed 3/1; 2-week follow-up. Continued improvement in surface area. The original divot that we have been debriding inferiorly I think has full epithelialization although the epithelialization is gone down into the wound with probably 4 mm of depth. Even under intense illumination I am unable to see anything open here. The remanence of the wound in this area actually look quite healthy. We have been using silver alginate 3/15; 2-week follow-up. Unfortunately not as good today. She has a comma shaped wound on the dorsal foot however the upper part of this is larger. Under illumination debris on the surface She also tells [place] that she was on her right leg 2 times in the last couple of weeks mostly to reach up for  things above her head etc. She felt a sharp pain in the right leg which she thinks is somewhere from the ankle to the knee. The patient has neuropathy and is really uncertain. She cannot feel her foot so she does not think it was coming from there 3/29; 2-week follow-up. Her wound measures smaller. Surface of the wound appears reasonable. She is using silver alginate with underlying Medihoney. She has home health. X-rays I did of her tib-fib last time were negative although it did show arterial calcification 4/12; 2-week follow-up. Her wound measures smaller in length. Using manuka honey with silver alginate on top. She has home health. 4/26; 2-week follow-up. Her wound is smaller but still very adherent  debris under illumination requiring debridement she has been using manuka honey with silver alginate. 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 Been using Hydrofera Blue. She is on suppressive Bactrim but apparently she has been taking it twice daily 6/7; I have not seen this wound and about 6 weeks. Since then she was up in [place]. By her own admission she was walking on the foot because she did not have a wheelchair. The wound is not nearly as healthy looking as it was the last time I saw this. We ordered different things for her but she only uses Medihoney and silver alginate. As far as I know she is on suppressive trimethoprim sulfamethoxazole. She does not admit to any fever or chills. Her CBGs apparently are at baseline however she is saying that she feels some discomfort on the lateral part of her ankle I looked over her last inflammatory markers from the summer 2020 at which time she had a deeply necrotic infected wound in this area. On 11/10/2018 her sedimentation rate was 56 and C-reactive protein 9.9. This was 107 and 29 on 07/29/2018. 6/17; the patient had a necrotic wound the last  time she was here on the right dorsal foot. After debridement I did a culture. This showed a very resistant ESBL Klebsiella as well as Enterococcus. Her x-ray of the foot which was done because of warmth and some discomfort showed bone destruction within the carpal bones involving the navicular acute cuboid lateral middle cuneiforms but essentially unchanged from her prior study which was done on 10/29/2018. The findings were felt to represent chronic osteomyelitis. We did inflammatory markers on her. Her white count was 5.25 sedimentation rate 16 and C-reactive protein at 11.1. Notable for the fact that in August 2020 her CRP was 9.9 and sedimentation rate 56. I have looked at her x-rays. It is true that the bone destruction is very impressive however the patient came into this clinic for the wound on her right foot with pieces of bone literally falling out anteriorly with purulent material. I am not exactly sure I could have expected anything different. She has not been systemically unwell no fever chills or blood sugars have been reasonable. 6/28; she arrives with a right heel closed. The substantial area on the right anterior foot looks healthy. Much better looking surface. I think we can change to [facility] seems to help this previously. She is getting her antibiotics at dialysis she should be just about finished 7/9; changed to [facility] last week. Surface wound looks satisfactory not much change in surface area however. She is going to [place] state next week this is usually a difficult thing for this patient follow-up will be for 2 weeks. 7/23; using Hydrofera Blue. She returns from her trip and the wound looks surprisingly good. Usually when this patient goes on trips she comes back with a lot of problems with the wound. She is saying that she sometimes feels an episodic "crunching" feeling on the lateral part of the foot. She is neuropathic and not feeling pain but wonders whether  this could be a neuropathic dysesthesia. 11/13/19-Patient returns after 3 weeks, the wound itself is stable and patient states that there is nothing new going on she is on some extra anxiety medications and is resisting the temptation to pick at the dry skin around the wound. 9/20; patient has not been  here in over a month and I have not seen her in 2 months. The wound in terms of size I think is about the same. There is no exposed bone. She has a nonviable surface on this. She is supposed to be using [facility] however she is also been using some form of honey preparation as well as a silver-based dressings. I do not think she has any pattern to this. 10/4; 2-week follow-up. Patient has been using some form of spray which she says has honey and silver to purchase this online she has been covering it with gauze. In spite of this the wound actually looks quite good. The deeper divot distally appears to be close down. There is a rim of epithelialization. 10/18; 2-week follow-up. Patient has been using her Hydrofera Blue covered with her silver honey spray that she got online. 11/1; 2-week follow-up. She is using Hydrofera Blue with a silver honey spray. Wound bed is measuring smaller. She has noticed that her foot is warmer on the right. She is concerned about infection. For a long period of time I had her on prophylactic trimethoprim sulfamethoxazole DS 1 tablet daily. She is asking for this to be restarted. The patient is walking on this foot because of repairs that are being done in a home her but her room is on the second floor she has to go up and down stairs. I have cautioned against this however as usual she will do exactly what she wants to do 11/15; 2-week follow-up. She uses Hydrofera Blue with a silver/honey spray which I have never heard of. I think her wound looks about the same. Some epithelialization. No evidence that this is infected. I think she is walking on this more than we agreed  on. She is going on extensive vacation over Thanksgiving 12/3; 2-week follow-up. She is using Hydrofera Blue however over Thanksgiving she ran out of this and she is simply been using Medihoney. In spite of this her wound is smaller almost divided into 2 now. She traveled extensively over Thanksgiving and actually looks quite good in spite of this. Usually this is been a marker of problems for her 12/17; 2-week follow-up. She is using Hydrofera Blue. The wound is smaller. Debris on the surface of this is fibrinous. She is traveling to [place] over the holidays which never bodes well for her wounds. I think she is walking more on her feet then she is even willing to admit and she tells me she does walk 2/11; using Hydrofera Blue. Her wounds are contracting however she walks in the clinic with a history that she has not been able to eat she has had vomiting. She also has right eye problems. Her blood sugar was 567. She had blood work done at dialysis and we called there to get her blood work although it still had not returned although they should have this by the end of the day we were told. She also stated that she was not sure what her blood sugar was as her glucose monitor was not working and not coming to tomorrow. She also for some reason does not think her insulin pump is working well. Her endocrinologist is Dr. Elayne Snare 06/03/2020 upon evaluation today patient actually appears to be doing excellent in regard to her wound. In fact she tells me it was not even bleeding until she picked a dry piece of skin off while we were getting ready to come in and see her today. Fortunately there is  no signs of active infection which is great news and overall very pleased. She is going to be seeing her neurologist sometime shortly I believe it might even be on Monday. Nonetheless there can proceed with the work-up as far as anything else going on with her eye the fact that her right eyelid is drooping. 3/25;  right medial dorsal foot. She has superficial areas here that appear to be fully epithelialized she is using Medihoney and silver alginate and some combination. She has a new area on the mid left foot at roughly the level of the fifth metatarsal base 4/84/8; right medial dorsal foot. Still superficial areas here. I cleaned up 1 of these today she has been using I think mostly Medihoney but I would like her to use silver alginate. The area on the mid part of her foot also looks improved on the left. Since she was last here she tells me that she noted pus in the area of her left foot. She told dialysis about this they did a culture and ultimately she is on IV antibiotics but were not sure which one. They referred her to [name] he said that he did not need to follow her. I have not really verified any of this and I do not know what antibiotic she is on at dialysis 4/29; patient has been using Medihoney to her wounds. She reports taking off the toenail to her left second toe. She states that home health discharged her and she has not had help with dressing changes. She denies any signs and symptoms of infection. 5/13; 2-week follow-up. Miraculously the wound on the right foot dorsally is healed. She has an area on the tip of her left third toe and an area in the left midfoot. 5/26; patient presents for 2-week follow-up. She has 2 open wounds 1 to her plantar foot and the other to the left third toe. She has noticed increased swelling and redness to the toe wound. It is unclear exactly what she is doing for dressing changes as she has multiple dressing options at home. She states that it is easiest to do [facility] on the plantar wound and Prisma or Medihoney on the toe wound. 6/3; saw Dr. Maryjane Hurter week. She put her on Keflex because of the swelling I think of the left second toe. She arrives today with new breakdown x3 on the dorsal right foot which is disappointing. She also complains of pain in the  right ankle, 3 liquid stools per day, chills without fever 6/17; patient was in [place]. I think a little more ambulatory than I would like to hear. In any case she still has the 3 small open areas on the dorsal part of her foot on the right medial leg these look about the same as last time. She is supposed to be using Prisma but I think used Medihoney. She has an area on the left lateral plantar foot which had undermining under her skin and a blood blister with the 2 areas communicating today. And finally an area on the tip of her left second toe 6/30; patient has 3 small areas that look better than the last time on the right dorsal foot. She uses a combination of her products including collagen, Hydrofera Blue and the Medihoney nevertheless they look better. The area on the left midfoot might have been healed although she pulled some skin off superficial open area. The left second toe still does not look that healthy. Open areas on the  tips of the 7/21; the areas on her right dorsal foot are still present although they look benign. We are using silver collagen although she may be using any of the products she had at home. The real problem here is the left second toe. Necrotic wound over the distal phalanx and distal interphalangeal joint. This probes directly to bone. She arrives in clinic today with an interesting story. She went to [place] last week. She became ill there with refractory nausea and vomiting. She went to an urgent care and then the ER. I am able to see parts of this in Care Everywhere. She had 2 blood cultures done both of which ultimately showed MRSA. This was sensitive to daptomycin and linezolid and vancomycin. Her white count was 4.8 hemoglobin 10.4. X-ray of the left foot showed prior resection of the left fifth metatarsal no evidence of osteomyelitis. Her C-reactive protein on 10/13/2020 was 104.8. She refused admission to the hospital as she was to board her flight to return  to [place]. She dialyzed this morning I am not certain why she did not bring this to their attention 7/27; fortunately the left second toe and the erythema in her left foot looks a lot better on the IV vancomycin at dialysis. I was able to verify earlier this week that she had indeed received it on the weekend starting last Saturday. She will need at least 4 weeks. I saw her left arm shunt and it looks fine. We are using silver alginate on the toe and we have been using I think Medihoney on the right foot which is actually been her decision. She has 2 open wounds dorsally. She does not describe fever chills her blood sugars are erratic but seem to be at her baseline 8/18; she has completed her antibiotics at dialysis but is unsure how long this was ordered. I have asked her to check when she goes to dialysis on Saturday. She is using polymen on the right foot dorsally and silver collagen not silver alginate to the left second toe. Her blood pressure is high today but blood sugars have been stable according to her she is having no fever or systemic symptoms 9/14; about a month since we have seen her. In the interim she was admitted to hospital from 11/21/2020 through 12/01/2020 with delirium secondary to sepsis. She was felt to have sepsis from a left second toe MRSA infection. She is on IV vancomycin and I think will complete 6 weeks of this on 12/29/2020. Her blood cultures were negative. A transthoracic echo did not show any vegetations. I do not believe she had a TEE. She comes in today with a sizable wound on the right medial foot with some necrotic surface in the mid part of this and also a wound on the tip of her left second toe. She has been using Medihoney 10/18; I have not seen this woman in about a month. In the meantime her left second toe seems to have healed over although likely that there has been bone or joint destruction in the distal part of the second toe. The area on the right  lateral foot is worse. Our intake nurses noted green drainage. Not completely clear as usual what she is using here. She tells me that she did see ground-up some antibiotics likely cephalexin although I am not sure about this and she took 2 days worth. She had called some weeks ago about the amount of antibiotics she got for her MRSA sepsis.  We referred her back to dialysis but she claims that she really only got a week of therapy. I find that hard to believe Electronic Signature(s) Signed: 01/17/2021 4:56:56 PM By: Linton Ham MD Entered By: Linton Ham on 01/17/2021 14:15:28 -------------------------------------------------------------------------------- Physical Exam Details Patient Name: Date of Service: [name] 01/17/2021 1:30 PM Medical Record Number: [id number] Patient Account Number: [id number] Date of Birth/Sex: Treating RN: 1970-04-24 (50 y.o. [name], [name] Primary Care Provider: [name], [name] Other Clinician: Referring Provider: Treating Provider/Extender: [name], [name] Weeks in Treatment: 123 Constitutional Sitting or standing Blood Pressure is within target range for patient.. Pulse regular and within target range for patient.Marland Kitchen Respirations regular, non-labored and within target range.. Temperature is normal and within the target range for the patient.Marland Kitchen Appears in no distress. Notes Wound exam; left plantar foot remains healed. Her left second toe is also healed although appears to have a significant amount of joint destruction secondary to osteomyelitis. The real problem here is on the right medial foot completely necrotic surface I debrided with a #5 curette hemostasis with direct pressure. After debridement I did a swab culture Electronic Signature(s) Signed: 01/17/2021 4:56:56 PM By: Linton Ham MD Entered By: Linton Ham on 01/17/2021  14:16:31 -------------------------------------------------------------------------------- Physician Orders Details Patient Name: Date of Service: [name] 01/17/2021 1:30 PM Medical Record Number: [id number] Patient Account Number: [id number] Date of Birth/Sex: Treating RN: 1970/09/28 (50 y.o. [name], [name] Primary Care Provider: [name], [name] Other Clinician: Referring Provider: Treating Provider/Extender: [name], [name] Weeks in Treatment: [phone] Verbal / Phone Orders: No Diagnosis Coding Follow-up Appointments Return appointment in 1 month. - Dr. Dellia Nims Bathing/ Shower/ Hygiene May shower and wash wound with soap and water. Edema Control - Lymphedema / SCD / Other Bilateral Lower Extremities Elevate legs to the level of the heart or above for 30 minutes daily and/or when sitting, a frequency of: - throughout the day Avoid standing for long periods of time. Moisturize legs daily. - with dressing changes Off-Loading Open toe surgical shoe to: - to both feet Other: - minimal weight bearing right foot Wound Treatment Wound #52 - Foot Wound Laterality: Right Cleanser: Soap and Water Every Other Day/30 Days Discharge Instructions: May shower and wash wound with dial antibacterial soap and water prior to dressing change. Cleanser: Wound Cleanser (DME) (Generic) Every Other Day/30 Days Discharge Instructions: Cleanse the wound with wound cleanser prior to applying a clean dressing using gauze sponges, not tissue or cotton balls. Prim Dressing: KerraCel Ag Gelling Fiber Dressing, 4x5 in (silver alginate) (DME) (Generic) Every Other Day/30 Days ary Discharge Instructions: Apply silver alginate to wound bed as instructed Secondary Dressing: Woven Gauze Sponge, Non-Sterile 4x4 in (DME) (Generic) Every Other Day/30 Days Discharge Instructions: Apply over primary dressing as directed. Secondary Dressing: ABD Pad, 5x9 (DME) (Generic) Every Other Day/30  Days Discharge Instructions: Apply over primary dressing as directed. Secured With: Elastic Bandage 4 inch (ACE bandage) (DME) (Generic) Every Other Day/30 Days Discharge Instructions: Secure with ACE bandage as directed. Secured With: [employer], 4.5x3.1 (in/yd) (DME) (Generic) Every Other Day/30 Days Discharge Instructions: Secure with Kerlix as directed. Secured With: Paper Tape, 1x10 (in/yd) (DME) (Generic) Every Other Day/30 Days Discharge Instructions: Secure dressing with tape as directed. Laboratory naerobe culture (MICRO) - right foot Bacteria identified in Unspecified specimen by A LOINC Code: 794-8 Convenience Name: Anerobic culture Electronic Signature(s) Signed: 01/17/2021 4:50:06 PM  By: Rhae Hammock RN Signed: 01/17/2021 4:56:56 PM By: Linton Ham MD Entered By: Rhae Hammock on 01/17/2021 14:07:01 -------------------------------------------------------------------------------- Problem List Details Patient Name: Date of Service: [name] 01/17/2021 1:30 PM Medical Record Number: [id number] Patient Account Number: [id number] Date of Birth/Sex: Treating RN: 1970/11/30 (50 y.o. [name], [name] Primary Care Provider: [name], [name] Other Clinician: Referring Provider: Treating Provider/Extender: [name], [name] Weeks in Treatment: 123 Active Problems ICD-10 Encounter Code Description Active Date MDM Diagnosis L97.514 Non-pressure chronic ulcer of other part of right foot with necrosis of bone 09/03/2018 No Yes E10.621 Type 1 diabetes mellitus with foot ulcer 09/24/2018 No Yes L97.529 Non-pressure chronic ulcer of other part of left foot with unspecified severity 07/29/2020 No Yes M86.172 Other acute osteomyelitis, left ankle and foot 10/20/2020 No Yes Inactive Problems ICD-10 Code Description Active Date Inactive Date M86.671 Other chronic osteomyelitis, right ankle and foot 09/03/2018 09/03/2018 L97.411  Non-pressure chronic ulcer of right heel and midfoot limited to breakdown of skin 09/17/2019 09/17/2019 L97.521 Non-pressure chronic ulcer of other part of left foot limited to breakdown of skin 04/20/2019 04/20/2019 L97.812 Non-pressure chronic ulcer of other part of right lower leg with fat layer exposed 02/24/2019 02/24/2019 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 01/17/2021 1:30 PM Medical Record Number: [id number] Patient Account Number: [id number] Date of Birth/Sex: Treating RN: Jul 03, 1970 (50 y.o. [name], [name] Primary Care Provider: [name], [name] Other Clinician: Referring Provider: Treating Provider/Extender: [name], [name] Weeks in Treatment: 123 Subjective History of Present Illness (HPI) When28 year old diabetic who is known to have type 1 diabetes which is poorly controlled last hemoglobin A1c was 11%. She comes in with a ulcerated area on the left lateral foot which has been there for over 6 months. Was recently she has been treated by [name] of podiatry who saw her last on 05/28/2016. Review of his notes revealed that the patient had incision and drainage with placement of antibiotic beads to the left foot on 04/11/2016 for possible osteomyelitis of the cuboid bone. Over the last year she's had a history of amputation of the left fifth toe and a femoropopliteal popliteal bypass graft somewhere  in April 2017. 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 His note [name] mentions that the patient has been referred to me for further wound care and possibly great candidate for hyperbaric oxygen therapy due to recurrent osteomyelitis. However we do not have any x-rays of biopsy reports confirming this. He has been on several antibiotics including Bactrim and most recently is on doxycycline for an MRSA. I understand, the patient was not a candidate for IV antibiotics as she has had previous PICC lines which resulted in blood clots in both arms. There was a x-ray report dated 04/04/2016 on [name] notes which showed evidence of fifth ray resection left foot with osteolytic changes noted to the fourth metatarsal and cuboid bone on the left. 06/13/2016 -- had a left foot x-ray which showed no acute fracture or dislocation and no definite radiographic  evidence of osteomyelitis. Advanced osteopenia was seen. 06/20/2016 -- she has noticed a new wound on the right plantar foot in the region where she had a callus before. 06/27/16- the patient did have her x-ray of the right foot which showed no findings to suggest osteomyelitis. She saw her endocrinologist, [name], yesterday. Her A1c in January was 11. He also indicates mismanagement and noncompliance regarding her diabetes. She is currently on Bactrim for a lip infection. She is complaining of nausea, vomiting and diarrhea. She is unable to articulate the exact orders or dosing of the Bactrim; it is unclear when she will complete this. 07/04/2016 -- results from [facility] of ABIs with ankle waveforms were noted from 02/14/2016. The examination done on 06/27/2015 showed noncompressible ABIs with the right being 1.45 and the left being 1.33. The present examination showed a right ABI of 1.19 on the left of 1.33. The conclusion was that right normal ABI in the lower extremity at rest however compared to previous study which was  noncompressible ABI may be falsely elevated side suggesting medial calcification. The left ABI suggested medial calcification. 08/01/2016 -- the patient had more redness and pain on her right foot and did not get to come to see as noted she see her PCP or go to the ER and decided to take some leftover metronidazole which she had at home. As usual, the patient does report she feels and is rather noncompliant. 08/08/2016 -- -- x-ray of the right foot -- FINDINGS:Transmetatarsal amputation is noted. No bony destruction is noted to suggest osteomyelitis. IMPRESSION: No evidence of osteomyelitis. Postsurgical changes are seen. MRI would be more sensitive for possible bony changes. Culture has grown Serratia Marcescens -- sensitive to Bactrim, ciprofloxacin, ceftazidime she was seen by [name] on 08/06/2016. He did not find any exposed bone, muscle, tendon, ligament or joint. There was no malodor and he did a excisional debridement in the office. ============ Old notes: 50 year old patient who is known to the wound clinic for a while had been away from the wound clinic since 09/01/2014. Over the last several months she has been admitted to various hospitals including [facility] [place]. She was treated for a right metatarsal osteomyelitis with a transmetatarsal amputation and this was done about 2 months ago. He has a small ulcerated area on the right heel and she continues to have an ulcerated area on the left plantar aspect of the foot. The patient was recently admitted to the [facility] hospital group between 7/12 and 10/18/2014. she was given 3 weeks of IV vancomycin and was to follow-up with her surgeons at [facility] and also took oral vancomycin for C. difficile colitis. 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 MRI of the left foot done on 10/14/2014 showed no abscess or osteomyelitis. 04/27/15; this is a patient we know from previous stays in the wound care center. She is a type I diabetic I am not sure of her control currently. Since the last time I saw her she is had a right transmetatarsal amputation and has no wounds on her right foot and has no open wounds. She is been followed at the wound care center at [facility] in [place]. She comes today with the desire to undergo hyperbaric treatment locally. Apparently one of her  wound care providers in [place] has suggested hyperbarics. This is in response to an MRI from 04/18/15 that showed increased marrow signal and loss of the proximal fifth metatarsal cortex evidence of osteomyelitis with likely early osteomyelitis in the cuboid bone as well. She has a large wound over the base of the fifth metatarsal. She also has a eschar over her the tips of her toes on 1,3 and 5. She does not have peripheral pulses and apparently is going for an angiogram tomorrow which seems reasonable. After this she is going to infectious disease at [facility]. They have been using Medihoney to the large wound on the lateral aspect of the left foot to. The patient has known Charcot deformity from diabetic neuropathy. She also has known diabetic PAD. Surprisingly I can't see that she has had any recent antibiotics, the patient states the last antibiotic she had was at the end of November for 10 days. I think this was in response to culture that showed group G strep although I'm not exactly sure where the culture was from. She is also had arterial studies on 03/29/15. This showed a right ABI of 1.4 that was noncompressible. Her left ABI was 0.73. 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 It was not felt that arterial inflow was adequate for  healing of a foot ulcer. Her Doppler waveforms looked monophasic ===== READMISSION 02/28/17; this is in an now 50 year old woman we've had at several different occasions in this clinic. 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 She has a remote ex-smoker. She was last seen in this clinic by Dr. Con Memos I think in May. More recently she is been followed by her podiatrist [name] an infectious disease Dr. Megan Salon. She has 2 open wounds the major one is over the right first metatarsal head she also has a wound on the left plantar foot. an MRI of the right foot on 01/01/17 showed a soft tissue ulcer along the plantar aspect of the first metatarsal base consistent with osteomyelitis of the first metatarsal stump. Dr. 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 According to the patient this is been open for slightly over a month. She has been on a combination of Cipro 500 twice a day, Zyvox 600 twice a day and Flagyl 500 3 times a day for over a month now as directed by Dr. Megan Salon. cultures of the right foot earlier this year showed MRSA in January and Serratia in May. January also had a few viridans strep. Recent x-rays of both feet were done and [name] office and I don't have these reports. The patient has known PAD and has a history of aleft femoropopliteal bypass in April 2017. 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 Dwyane Dee. In spite of this the last hemoglobin A1c I can see is 10.1 on 01/01/2017. She is being referred by 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 She is been using Medihoney to this area and also an area on the left midfoot.  She is using healing sandals bilaterally. ABIs in this clinic at the left posterior tibial was 1.1 noncompressible on the right READMISSION Non invasive vascular NOVANT 5/18  Aftercare following surgery of the circulatory system Procedure Note - Interface, External Ris In - 08/13/2016 11:05 AM EDT Procedure: Examination consists of physiologic resting arterial pressures of the brachial and ankle arteries bilaterally with continuous wave Doppler waveform analysis. Previous: Previous exam performed on 02/14/16 demonstrated ABIs of Rt = 1.19 and Lt = 1.33. Right: ABI = non-compressible PT 1.47 DP. S/P transmet amputation. , Left: ABI = 1.52, 2nd digit pressure = 87 mmHg Conclusions: Right: ABI (>1.3) may be falsely elevated, suggesting medial calcification. Left: ABI (>1.3) may be falsely elevated, suggesting medial calcification The patient is a now 50 year old type I diabetic is had multiple issues her graded to chronic diabetic foot ulcers. 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 We had her in the clinic lastin November. At that point she had wounds on her bilateral feet.she had wanted to try hyperbarics however the healogics review process denied her because she hadn't followed up with her vascular surgeon for her left femoropopliteal bypass. The bypass was done by Dr. Raul Del at [facility]. We made her a follow-up with Dr. Raul Del however she did not keep the appointment and therefore she was not approved The patient shows me a small wound on her left fourth metatarsal head on her phone. She developed rapid discoloration in the plantar aspect of the left foot and she was admitted to hospital from 2/2 through 05/10/17 with wet gangrene of the left foot osteomyelitis of the fourth metatarsal heads. She was admitted acutely ill with a temperature of 103. She was started on broad-spectrum vancomycin and cefepime.  On 05/06/17 she was taken to the OR by [name] her podiatric surgeon for an incision and drainage irrigation of the left foot wound. Cultures from this surgery revealed group be strep and anaerobes. she was seen by Dr.Xu of orthopedic surgery and scheduled for a below-knee amputation which she u refused. Ultimately she was discharged on Levaquin and Flagyl for one month. MRI 05/05/17 done while she was in the hospital showed abscess adjacent to the fourth metatarsal head and neck small abscess around the fourth flexor tendon. Inflammatory phlegmon and gas in the soft tissues along the lateral aspect of the fourth phalanx. Findings worrisome for osteomyelitis involving the fourth proximal and middle phalanx and also the third and fourth metatarsals. Finally the patient had actually shortly before this followed up with Dr. Raul Del at no time on 04/29/17. He felt that her left femoropopliteal bypass was patent he felt that her left-sided toe pressures more than adequate for healing a wound on the left foot. This was before her acute presentation. 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 05/28/17; she is started hyperbarics. The patient tells me that for some reason she was not actually on Levaquin but I think on ciprofloxacin. She was on Flagyl. She only started her Levaquin yesterday due to some difficulty with the pharmacy and perhaps her sister picking it up. She has an appointment with [name] tomorrow and with infectious disease early next week. She has no new complaints 06/06/17; the patient continues in hyperbarics. She saw [name] on 05/29/17 who is her podiatric surgeon. He is elected for a transmetatarsal amputation on 06/27/17. I'm not sure at what level he plans to do this amputation. The patient is unaware ooShe also saw Dr. Megan Salon of infectious disease who elected to continue her on current antibiotics I think this is ciprofloxacin and Flagyl. I'll need to clarify with her tomorrow  if she  actually has this. We're using silver alginate to the actual wound. Necrotic surface today with material under the flap of her foot. ooOriginal MRI showed abscesses as well as osteomyelitis of the proximal and middle fourth phalanx and the third and fourth metatarsal heads 06/11/17; patient continues in hyperbarics and continues on oral antibiotics. She is doing well. The wound looks better. The necrotic part of this under the flap in her superior foot also looks better. she is been to see [name]. I haven't had a chance to look at his note. Apparently he has put the transmetatarsal amputation on hold her request it is still planning to take her to the OR for debridement and product application ACEL. I'll see if I can find his note. I'll therefore leave product ordering/requests to [name] for now. I was going to look at Dermagraft 06/18/17-she is here in follow-up evaluation for bilateral foot wounds. She continues with hyperbaric therapy. She states she has been applying manuka honey to the right plantar foot and alternate manuka honey and silver alginate to the left foot, despite our orders. We will continue with same treatment plan and she will follo up next week. 06/25/17; I have reviewed [name] last note from 3/11. She has operative debridement in 2 days' time. By review his note apparently they're going to place there is skin over the majority of this wound which is a good choice. She has a small satellite area at the most proximal part of this wound on the left plantar foot. The area on the right plantar foot we've been using silver alginate and it is close to healing. 07/02/17; unfortunately the patient was not easily approved for [name] proposed surgery. I'm not completely certain what the issue is. She has been using silver alginate to the wound she has completed a first course of hyperbarics. She is still on Levaquin and Flagyl. I have really lost track of the time course here.I  suspect she should have another week to 2 of antibiotics. I'll need to see if she is followed up with infectious disease Dr. Megan Salon 07/09/17; the patient is followed up with Dr. Megan Salon. 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 She continues on Levaquin and metronidazole continuing both of these for now I think she is been on fr about 6 weeks. She still has some drainage but no pain. No fever. Her had been plans for her to go to the OR for operative debridement with her podiatrist [name], I am not exactly sure where that is. I'll probably slip a note to [name] today. I note that she follows with Dr. Dwyane Dee of endocrinology. We have her recertified for hyperbaric oxygen. I have not heard about Dermagraft however I'll see if [name] is planning a skin substitute as well 07/16/17; the patient tells me she is just about out of [place]. I'll need to check Dr. Hale Bogus last notes on this. She states she has plenty of Flagyl however. She comes in today complaining of pain in the right lateral foot which she said lasted for about a day. The wound on the right foot is actually much more medially. She also tells me that the [facility] [employer] cost a lot of pain in the left foot wound and she turned back to silver alginate. Finally Dermagraft has a $720 per application co-pay. She cannot afford this 07/23/17; patient arrives today with the wound not much smaller.  There is not much new to add. She has not heard from [name] all try to put in a call to them today. She was asking about Dermagraft again and she has an over $027 per application co-pay she states that she would be willing to try to do a payment plan. I been tried to avoid this. We've been using silver alginate, I'll change to [facility] 07/30/17-She is here in follow-up evaluation for left foot ulcer. She continues hyperbaric medicine. The left foot ulcer is stable we will continue with  same treatment plan 08/06/17; she is here for evaluation of her left foot ulcer. Currently being treated for hyperbarics or underlying osteomyelitis. She is completed antibiotics. The left foot ulcer is better smaller with healthier looking granulation. For various reasons I am not really clear on we never got her back to the OR with [name]. He did not respond to my secure text message. Nevertheless I think that surgery on this point is not necessary nor am I completely clear that a skin substitute is necessary The patient is complaining about pain on the outside of her right foot. 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 There is no erythema. She also states the foot is warm versus her other part of her upper leg and this is largely true. It is not totally clear to me what's causing this. She thinks it's different from her usual neuropathy pain 08/13/17; she arrives in clinic today with a small wound which is superficial on her right first metatarsal head. 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 She tells [place] she was up on her feet over the Mother's Day celebration. ooThe large wound is on the left foot. Continues with hyperbarics for underlying osteomyelitis. We're using Hydrofera Blue. She asked me today about where we were with Dermagraft. I had actually excluded this because of the co-pay however she wants to assume this therefore I'll recheck the co-pay an order for next week. 08/20/17; the patient agreed to accept the co-pay of the first Apligraf which we applied today. She is disappointed she is finishing hyperbarics will run this through the insurance on the extent of the foot infection and the extent of the wound that she had however she is already had 60 dive's. Dermagraft No. 1 08/27/17; Dermagraft No. 2. She is not eligible for any more hyperbaric treatments this month. She reports a fair amount of drainage and she actually changed to the external  dressings without disturbing the direct contact layer 09/03/17; the patient arrived in clinic today with the wound superficially looking quite healthy. Nice vibrant red tissue with some advancing epithelialization although not as much adherence of the flap as I might like. However she noted on her own fourth toe some bogginess and she brought that to our attention. Indeed this was boggy feeling like a possibility of subcutaneous fluid. 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 She is not been unwell. We've been using Dermagraft 09/10/17; the culture that I did not last week was MRSA. She saw Dr. Megan Salon this morning who is going to start her on vancomycin. I had sent him a secure a text message yesterday. I also spoke with her podiatric surgeon [name] about surgery on this foot the options for conserving a functional foot etc. Promised me he would see her and will make back consultation today. Paradoxically her actual wound on  the plantar aspect of her left foot looks really quite good. I had given her 5 days worth of Baxdella to cover her for MRSA. Her MRI came back showing osteomyelitis within the third metatarsal shaft and head and base of the third and fourth proximal phalanx. She had extensive inflammatory changes throughout the soft tissue of the lateral forefoot. With an ill-defined fluid around the fourth metatarsal extending into the plantar and dorsal soft tissues 09/19/17; the patient is actually on oral Septra and Flagyl. She apparently refused IV vancomycin. She also saw [name] at my request who is planning her for a left BKA sometime in mid July. MRI showed osteomyelitis within the third metatarsal shaft and head and the basis of the third and fourth proximal phalanx. I believe there was felt to be possible septic arthritis involving the third MTP. 09/26/17; the patient went back to Dr. Megan Salon at my suggestion  and is now receiving IV daptomycin. Her wound continues to look quite good making the decision to proceed with a transmetatarsal amputation although more difficult for the patient. I believe in my extensive discussions with her she has a good sense of the pros and cons of this. I don't NV the tuft decision she has to make. She has an appointment with [name] I believe in mid July and I previously spoken to him about this issue Has we had used 3 previous Dermagraft. Given the condition of the wound surface I went ahead and added the fourth one today area and I did this not fully realizing that she'll be traveling to [place] next week. I'm hopeful she can come back in 2 weeks 10/21/17; Her same Dermagraft on for about 3-1/2 weeks. In spite of this the wound arrives looking quite healthy. There is been a lot of healing dimensions are smaller. Looking at the square shaped wound she has now there is some undermining and some depth medially under the undermining although I cannot palpate any bone. No surrounding infection is obvious. She has difficult questions about how to look at this going forward vis--vis amputations versus continued medical therapy. T be truthful the wound is looks so o healthy and it is continued to contract. Hard to justify foot surgery at this point although I still told her that I think it might come to that if we are not able to eradicate the underlying MRSA. She is still highly at risk and she understands this 11/06/17 on evaluation today patient appears to be doing better in regard to her foot ulcer. She's been tolerating the dressing changes without complication. Currently she is here for her Dermagraft #6. Her wound continues to make excellent progress at this point. She does not appear to have any evidence of infection which is good news. 11/13/17 on evaluation today patient appears to be doing excellent at this time. She is here for repeat Dermagraft application. This is  #7. Overall her wound seems to be making great progress. 12/05/17; the patient arrives with the wound in much better condition than when I last saw this almost 6 weeks ago. She still has a small probing area in the left metatarsal head region on the lateral aspect of her foot. We applied her last Dermagraft today. ooSince the last time she is here she has what appears to a been a blood blister on the plantar aspect of left foot although I don't see this is threatening. There is also a thick raised tissue on the right mid metatarsal head region.  This was not there I don't think the last time she was here 3 weeks ago. 12/12/17; the patient continues to have a small programming area in the left metatarsal head region on the lateral aspect of her foot which was the initial large surgical wound. I applied her last Apligraf last week. I'm going to use Endoform starting today ooUnfortunately she has an excoriated area in the left mid foot and the right mid foot. The left midfoot looks like a blistered area this was not opened last week it certainly is open today. Using silver alginate on these areas. She promises me she is offloading this. 12/19/17; the small probing area in the left metatarsal head eyes think is shallower. In general her original wound looks better. We've been using Endoform. The area inferiorly that I think was trauma last week still requires debridement a lot of nonviable surface which I removed. She still has an open open area distally in her foot ooSimilarly on the right foot there is tightly adherent surface debris which I removed. Still areas that don't look completely epithelialized. This is a small open area. We used silver alginate on these areas 12/26/2017; the patient did not have the supplies we ordered from last week including the Endoform. The original large wound on the left lateral foot looks healthy. She still has the undermining area that is largely unchanged from last  week. She has the same heavily callused raised edged wounds on the right mid and left midfoot. Both of these requiring debridement. We have been using silver alginate on these areas 01/02/2018; there is still supply issues. We are going to try to use Prisma but I am not sure she actually got it from what she is saying. She has a new open area on the lateral aspect of the left fourth toe [previous fifth ray amputation]. Still the one tunneling area over the fourth metatarsal head. The area is in the midfoot bilaterally still have thick callus around them. She is concerned about a raised swelling on the lateral aspect of the foot. However she is completely insensate 01/10/2018; we are using Prisma to the wounds on her bilateral feet. Surprisingly the tunneling area over the left fourth metatarsal head that was part of her original surgery has closed down. She has a small open area remaining on the incision line. 2 open areas in the midfoot. 02/10/2018; the patient arrives back in clinic after a month hiatus. She was traveling to visit family in [place]. Is fairly clear she was not offloading the areas on her feet. The original wound over the left lateral foot at the level of metatarsal heads is reopened and probes medially by about a centimeter or 2. She notes that a week ago she had purulent drainage come out of an area on the left midfoot. Paradoxically the worst area is actually on the right foot is extensive with purulent drainage. We will use silver alginate today 02/17/2018; the patient has 3 wounds one over the left lateral foot. She still has a small area over the metatarsal heads which is the remnant of her original surgical wound. This has medial probing depth of roughly 1.4 cm somewhat better than last week. The area on the right foot is larger. We have been using silver alginate to all areas. The area on the right foot and left foot that we cultured last week showed both Klebsiella and  Proteus. Both of these are quinolone sensitive. The patient put her's self on Bactrim and  Flagyl that she had left hanging around from prior antibiotic usages. She was apparently on this last week when she arrived. I did not realize this. Unfortunately the Bactrim will not cover either 1 of these organisms. We will send in Cipro 500 twice daily for a week 03/04/2018; the patient has 2 wounds on the left foot one is the original wound which was a surgical wound for a deep DFU. At one point this had exposed bone. She still has an area over the fourth metatarsal head that probes about 1.4 cm although I think this is better than last week. I been using silver nitrate to try and promote tissue adherence and been using silver alginate here. ooShe also has an area in the left midfoot. This has some depth but a small linear wound. Still requiring debridement. ooOn the right midfoot is a circular wound. A lot of thick callus around this area. ooWe have been using silver alginate to all wound areas ooShe is completed the ciprofloxacin I gave her 2 weeks ago. 03/11/2018; the patient continues to have 2 open areas on the left foot 1 of which was the original surgical wound for a deep DFU. Only a small probing area remains although this is not much different from last week we have been using silver alginate. The other area is on the midfoot this is smaller linear but still with some depth. We have been using silver alginate here as well ooOn the right foot she has a small circular wound in the mid aspect. This is not much smaller than last time. We have been using silver alginate here as well 03/18/2018; she has 3 wounds on the left foot the original surgical wound, a very superficial wound in the mid aspect and then finally the area in the mid plantar foot. She arrives in today with a very concerning area in the wound in the mid plantar foot which is her most proximal wound. There is undermining here of  roughly 1-1/2 cm superiorly. Serosanguineous drainage. She tells me she had some pain on for over the weekend that shot up her foot into her thigh and she tells me that she had a nodule in the groin area. ooShe has the single wound in the right foot. ooWe are using endoform to both wound areas 03/24/2018; the patient arrives with the original surgical wound in the area on the left midfoot about the same as last week. There is a collection of fluid under the surface of the skin extending from the surgical wound towards the midfoot although it does not reach the midfoot wound. The area on the right foot is about the same. Cultures from last week of the left midfoot wound showed abundant Klebsiella abundant Enterococcus faecalis and moderate methicillin resistant staph I gave her Levaquin but this would have only covered the Klebsiella. She will need linezolid 04/01/2018; she is taking linezolid but for the first few days only took 1 a day. I have advised her to finish this at twice daily dosing. In any case all of her wounds are a lot better especially on the left foot. The original surgical wound is closed. The area on the left midfoot considerably smaller. The area on the right foot also smaller. 04/08/2018; her original surgical wound/osteomyelitis on the left foot remains closed. She has area on the left foot that is in the midfoot area but she had some streaking towards this. This is not connected with her original wound at least not visually.   ooSmall wound on the right midfoot appears somewhat smaller. 04/15/18; both wounds looks better. Original wound is better left midfoot. Using silver alginate 1/21; patient states she uses saltwater soak in, stones or remove callus from around her wounds. She is also concerned about a blood blister she had on the left foot but it simply resolved on its own. We've been using silver alginate 1/28; the patient arrives today with the same streaking area from  her metatarsals laterally [the site of her original surgical wound] down to the middle of her foot. There is some drainage in the subcutaneous area here. This concerns me that there is actually continued ongoing infection in the metatarsals probably the fourth and third. This fixates an MRI of the foot without contrast [chronic renal failure] ooThe wound in the mid part of the foot is small but I wonder whether this area actually connects with the more distal foot. ooThe area on the right midfoot is probably about the same. Callus thick skin around the small wound which I removed with a curette we have been using silver alginate on both wound areas 2/4; culture I did of the draining site on the left foot last time grew methicillin sensitive staph aureus. MRI of the left foot showed interval resolution of the findings surrounding the third metatarsal joint on the prior study consistent with treated osteomyelitis. Chronic soft tissue ulceration in the plantar and lateral aspect of the forefoot without residual focal fluid collection. No evidence of recurrent osteomyelitis. 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 She tells me she had nausea and vomiting over the weekend and missed 2 days. In general her foot looks much the same. She has a small open area just below the left fourth metatarsal head. A linear area in the left midfoot. Some discoloration extending from the inferior part of this into the left lateral foot although this appears to be superficial. She has a small area on the right midfoot which generally looks smaller after debridement 2/18; the patient is completing his cephalexin and has another 2 days. She continues to have open areas on the left  and right foot. 2/25; she is now off antibiotics. The area on the left foot at the site of her original surgical wound has closed yet again. She still has open areas in the mid part of her foot however these appear smaller. The area on the right mid foot looks about the same. We have been using silver alginate She tells me she had a serious hypoglycemic spell at home. She had to have EMS called and get IV dextrose 3/3; disappointing on the left lateral foot large area of necrotic tissue surrounding the linear area. This appears to track up towards the same original surgical wound. Required extensive debridement. The area on the right plantar foot is not a lot better also using silver 3/12; the culture I did last time showed abundant enterococcus. I have prescribed Augmentin, should cover any unrecognized anaerobes as well. In addition there were a few MRSA and Serratia that would not be well covered although I did not want to give her multiple antibiotics. She comes in today with a new wound in the right midfoot this is not connected with the original wound over her  MTP a lot of thick callus tissue around both wounds but once again she said she is not walking on these areas 3/17-Patient comes in for follow-up on the bilateral plantar wounds, the right midfoot and the left plantar wound. Both these are heavily callused surrounding the wounds. We are continuing to use silver alginate, she is compliant with offloading and states she uses a wheelchair fairly often at home 3/24; both wound areas have thick callus. However things actually look quite a bit better here for the majority of her left foot and the right foot. 3/31; patient continues to have thick callused somewhat irritated looking tissue around the wounds which individually are fairly superficial. There is no evidence of surrounding infection. We have been using silver alginate however I change that to [facility] today 4/17; patient returns  to clinic after having a scare with Covid she tested negative in her primary doctor's office. She has been using Hydrofera Blue. She does not have an open area on the right foot. On the left foot she has a small open area with the mid area not completely viable. She showed me pictures of what looks like a hemorrhagic blister from several days ago but that seems to have healed over this was on the lateral left foot 4/21; patient comes in to clinic with both her wounds on her feet closed. However over the weekend she started having pain in her right foot and leg up into the thigh. She felt as though she was running a low-grade fever but did not take her temperature. She took a doxycycline that she had leftover and yesterday a single Septra and metronidazole. She thinks things feel somewhat better. 4/28; duplex ultrasound I ordered last week was negative for DVT or superficial thrombophlebitis. She is completed the doxycycline I gave her. States she is still having a lot of pain in the right calf and right ankle which is no better than last week. She cannot sleep. She also states she has a temperature of up to 101, coughing and complaining of visual loss in her bilateral eyes. Apparently she was tested for Covid 2 weeks ago at [facility] and that was negative. Readmission: 09/03/18 patient presents back for reevaluation after having been evaluated at the end of April regarding erythema and swelling of her right lower extremity. Subsequently she ended up going to the hospital on 07/29/18 and was admitted not to be discharged until 08/08/18. Unfortunately it was noted during the time that she was in the hospital that she did have methicillin-resistant Staphylococcus aureus as the infection noted at the site. It was also determined that she did have osteomyelitis which appears to be fairly significant. She was treated with vancomycin and in fact is still on IV vancomycin at dialysis currently. This is actually slated  to continue until 09/12/18 at least which will be the completion of the six weeks of therapy. Nonetheless based on what I'm seeing at this point I'm not sure she will be anywhere near ready to discontinue antibiotics at that time. Since she was released from the hospital she was seen by [name] who is her podiatrist on 08/27/18. His note specifically states that he is recommended that the patient needs of one knee amputation on the right as she has a life- threatening situation that can lead quickly to sepsis. The patient advised she would like to try to save her leg to which [name] apparently told her that this was against all medical advice. She also  want to discontinue the Wound VAC which had been initiated due to the fact that she wasn't pleased with how the wound was looking and subsequently she wanted to pursue applying Medihoney at that time. He stated that he did not believe that the right lower extremity was salvageable and that the patient understood but would still like to attempt hyperbaric option therapy if it could be of any benefit. She was therefore referred back to [place] for further evaluation. He plans to see her back next week. Upon inspection today patient has a significant amount purulent drainage noted from the wound at this point. The bone in the distal portion of her foot also appears to be extremely necrotic and spongy. When I push down on the bone it bubbles and seeps purulent drainage from deeper in the end of the foot. I do not think that this is likely going to heal very well at all and less aggressive surgical debridement were undertaken more than what I believe we can likely do here in our office. 09/12/2018; I have not seen this patient since the most recent hospitalization although she was in our clinic last week. I have reviewed some of her records from a complex hospitalization. 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  There is situation was complicated by MRSA bacteremia and acute on chronic renal failure now on dialysis. She is receiving vancomycin at dialysis. We started her on Dakin's wet-to-dry last week she is changing this daily. There is still purulent drainage coming out of her foot. Although she is apparently "agreeable" to a below-knee amputation which is been suggested by multiple clinicians she wants this to be done in [place]. She apparently has a telehealth visit with that provider sometime in late [place] 6/24. I have told her I think this is probably too long. Nevertheless I could not convince her to allow a local doctor to perform BKA. 09/19/2018; the patient has a large necrotic area on the right anterior foot. She has had previous transmetatarsal amputations. Culture I did last week showed MRSA nothing else she is on vancomycin at dialysis. She has continued leaking purulent drainage out of the distal part of the large circular wound on the right anterior foot. She apparently went to see Dr. 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 Somehow that translated into her being referred to plastic surgery for debridement of the area. I gather she basically refused amputation although I do not have a copy of Dr. Berenice Primas notes. The patient really wants to have a trial of hyperbaric oxygen. I agreed with initial assessment in this clinic that this was probably too far along to benefit however if she is going to have plastic surgery I think she would benefit from ancillary hyperbaric oxygen. The issue here is that the patient has benefited as maximally as any patient I have ever seen from hyperbaric oxygen therapy. Most recently she had exposed bone on the lateral part of her left foot after a surgical procedure and that actually has closed. She has eschared areas in both heels but no open area. She is remained systemically well. I am not optimistic that anything can be done  about this but the patient is very clear that she wants an attempt. The attempt would include a wound VAC further debridements and hyperbaric oxygen along with IV antibiotics. 6/26; I put her in for a trial of hyperbaric oxygen only because of the dramatic response  she has had with wounds on her left midfoot earlier this year which was a surgical wound that went straight to her bone over the metatarsal heads and also remotely the left third toe. We will see if we can get this through our review process and insurance. She arrives in clinic with again purulent material pouring out of necrotic bone on the top of the foot distally. There is also some concerning erythema on the front of the leg that we marked. It is bit difficult to tell how tender this is because of neuropathy. I note from infectious disease that she had her vancomycin extended. 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 She had the wound VAC on for part of the week. The rest of the time she is putting various things on this including Medihoney, "ionized water" silver sorb gel etc. 7/7; follow-up along with HBO. She is still on vancomycin at dialysis. She has a large open area on the dorsal right foot and a small dark eschar area on her heel. There is a lot less erythema in the area and a lot less tenderness. From an infection point of view I think this is better. She still has a lot of necrosis in the remaining right forefoot [previous TMA] we are still using the wound VAC in this area 7/16; follow-up along with HBO. I put her on linezolid after she finished her vancomycin. We started this last Friday I gave her 2 weeks worth. I had the expectation that she would be operatively debrided by Dr. Marla Roe but that still has not happened yet. Patient phoned the office this week. She arrives for review today after HBO. The distal part of this wound is completely necrotic. Nonviable pieces of tendon bone was  still purulent drainage. Also concerning that she has black eschar over the heel that is expanding. I think this may be indicative of infection in this area as well. She has less erythema and warmth in the ankle and calf but still an abnormal exam 7/21 follow-up along with HBO. I will renew her linezolid after checking a CBC with differential monitoring her blood counts especially her platelets. She was supposed to have surgery yesterday but if I am reading things correctly this was canceled after her blood sugar was found to be over 500. I thought Dr. Marla Roe who called me said that they were sending her to the ER but the patient states that was not the case. 7/28. Follow-up along with HBO. She is on linezolid I still do not have any lab work from dialysis even though I called last week. The patient is concerned about an area on her left lateral foot about the level of the base of her fifth metatarsal. I did not really see anything that ominous here however this patient is in [place] ability to point out problems that she is sensing and she has been accurate in the past Finally she received a call from Dr. Marla Roe who is referring her to another orthopedic surgeon stating that she is too booked up to take her to the operating room now. Was still using a wound VAC on the foot 8/3 -Follow-up after HBO, she is got another week of linezolid, she is to call [facility] for an appointment, x-rays of both feet were reviewed, the left foot x-ray with third MTP joint osteo- Right foot x-ray widespread osteo-in the right midfoot Right ankle x-ray does not show any active evidence of infection 8/11-Patient is  seen after HBO, the wounds on the right foot appear to be about the same, the heel wound had some necrotic base over tendon that was debrided with a curette 8/21; patient is seen after HBO. The patient's wound on her dorsal foot actually looks reasonably good and there is substantial amount of  epithelialization however the open area distally still has a lot of necrotic debris partially bone. I cannot really get a good sense of just how deep this probes under the foot. She has been pressuring me this week to order medical maggots through a company in [place] for her. The problem I have is there is not a defined wound area here. On the positive side there is no purulence. She has been to see infectious disease she is still on Septra DS although I have not had a chance to review their notes 8/28; patient is seen in conjunction with HBO. The wounds on her foot continued to improve including the right dorsal foot substantially the, the distal part of this wound and the area on the right heel. We have been using a wound VAC over this chronically. She is still on trimethoprim as directed by infectious disease 9/4; patient is seen in conjunction with HBO. Right dorsal foot wound substantially anteriorly is better however she continues to have a deep wound in the distal part of this that is not responding. We have been using silver collagen under border foam ooArea on the right plantar medial heel seems better. We have been using Hydrofera Blue 12/12/18 on evaluation today patient appears to be doing about the same with regard to her wound based on prior measurements. She does have some necrotic tissue noted on the lateral aspect of the wound that is going require a little bit of sharp debridement today. This includes what appears to be potentially either severely necrotic bone or tendon. Nonetheless other than that she does not appear to have any severe infection which is good news 9/18; it is been 2 weeks since I saw this wound. She is tolerating HBO well. Continued dramatic improvement in the area on the right dorsal foot. She still has a small wound on the heel that we have been using Hydrofera Blue. She continues with a wound VAC 9/24; patient has to be seen emergently today with a swelling  on her right lateral lower leg. She says that she told Dr. Evette Doffing about this and also myself on a couple of occasions but I really have no recollection of this. She is not systemically unwell and her wound really looked good the last time I saw this. She showed this to providers at dialysis and she was able to verify that she was started on cephalexin today for 5 doses at dialysis. She dialyzes on Tuesday Thursday and Saturday. 10/2; patient is seen in conjunction with HBO. The area that is draining on the right anterior medial tibia is more extensive. Copious amounts of serosanguineous drainage with some purulence. We are still using the wound VAC on the original wound then it is stable. Culture I did of the original IandD showed MRSA I contacted dialysis she is now on vancomycin with dialysis treatments. I asked them to run a month 10/9; patient seen in conjunction with HBO. She had a new spontaneous open area just above the wound on the right medial tibia ankle. More swelling on the right medial tibia. Her wound on the foot looks about the same perhaps slightly better. There is no warmth spreading up  her leg but no obvious erythema. her MRI of the foot and ankle and distal tib-fib is not booked for next Friday I discussed this with her in great detail over multiple days. it is likely she has spreading infection upper leg at least involving the distal 25% above the ankle. She knows that if I refer her to orthopedics for infectious disease they are going to recommend amputation and indeed I am not against this myself. We had a good trial at trying to heal the foot which is what she wanted along with antibiotics debridement and HBO however she clearly has spreading infection [probably staph aureus/MRSA]. Nevertheless she once again tells me she wants to wait the left of the MRI. She still makes comments about having her amputation done in [place]. 10/19; arrives today with significant swelling  on the lateral right leg. Last culture I did showed Klebsiella. Multidrug-resistant. Cipro was intermediate sensitivity and that is what I have her on pending her MRI which apparently is going to be done on Thursday this week although this seems to be moving back and forth. She is not systemically unwell. We are using silver alginate on her major wound area on the right medial foot and the draining areas on the right lateral lower leg 10/26; MRI showed extensive abscess in the anterior compartment of the right leg also widespread osteomyelitis involving osseous structures of the midfoot and portions of the hindfoot. Also suspicion for osteomyelitis anterior aspect of the distal medial malleolus. Culture I did of the purulence once again showed a multidrug-resistant Klebsiella. I have been in contact with nephrology late last week and she has been started on cefepime at dialysis to replace the vancomycin We sent a copy of her MRI report to Dr. Geroge Baseman in [place] who is an orthopedic surgeon. The patient takes great stock in his opinion on this. She says she will go to [place] to have her leg amputated if Dr. Geroge Baseman does not feel there is any salvage options. 11/2; she still is not talk to her orthopedic surgeon in [place]. Apparently he will call her at [phone] this afternoon. The quality of this is she has not allowed me to refer her anywhere. She has been told over and over that she needs this amputated but has not agreed to be referred. She tells me her blood sugar was 600 last night but she has not been febrile. 11/9; she never did got a call from the orthopedic surgeon in [place] therefore that is off the radar. We have arranged to get her see orthopedic surgery at [facility]. She still has a lot of draining purulence coming out of the new abscess in her right leg although that probably came from the osteomyelitis in her right foot and heel. Meanwhile the original wound on the right foot  looks very healthy. Continued improvement. The issue is that the last MRI showed osteomyelitis in her right foot extensively she now has an abscess in the right anterior lower leg. There is nobody in [place] who will offer this woman anything but an amputation and to be honest that is probably what she needs. I think she still wants to talk about limb salvage although at this point I just do not see that. She has completed her vancomycin at dialysis which was for the original staph aureus she is still on cefepime for the more recent Klebsiella. She has had a long course of both of these antibiotics which should have benefited the osteomyelitis on the right  foot as well as the abscess. 11/16; apparently [place] elective surgery is shut down because of COVID-19 pandemic. I have reached out to some contacts at [facility] to see if we can get her an orthopedic appointment there. I am concerned about continually leaving this but for the moment everything is static. In fact her original large wound on this foot is closing down. It is the abscess on the right anterior leg that continues to drain purulent serosanguineous material. She is not currently on any antibiotics however she had a prolonged course of vancomycin [1 month] as well as cefepime for a month 02/24/2019 on evaluation today patient appears to be doing better than the last time I saw her. This is not a patient that I typically see. With that being said I am covering for Dr. Dellia Nims this week and again compared to when I last saw her overall the wounds in particular seem to be doing significantly better which is good news. With that being said the patient tells me several disconcerting things. She has not been able to get in to see anyone for potential debridement in regard to her leg wounds although she tells me that she does not think it is necessary any longer because she is taking care of that herself. She noticed a string coming out of  the lower wound on her leg over the last week. The patient states that she subsequently decided that we must of pack something in there and started pulling the string out and as it kept coming and coming she realized this was likely her tendon. With that being said she continued to remove as much of this as she could. She then I subsequently proceeded to using tubes of antibiotic ointment which she will stick down into the wound and then scored as much as she can until she sees it coming out of the other wound opening. She states that in doing this she is actually made things better and there is less redness and irritation. With regard to her foot wound she does have some necrotic tendon and tissue noted in one small corner but again the actual wound itself seems to be doing better with good granulation in general compared to my last evaluation. 12/7; continued improvement in the wound on the substantial part of the right medial foot. Still a necrotic area inferiorly that required debridement but the rest of this looks very healthy and is contracting. She has 2 wounds on the right lateral leg which were her original drainage sites from her abscess but all of this looks a lot better as well. She has been using silver alginate after putting antibiotic biotic ointment in one wound and watching it come out the other. I have talked to her in some detail today. I had given her names of orthopedic surgeons at [facility] for second opinion on what to do about the right leg. I do not think the patient never called them. She has not been able to get a hold of the orthopedic surgeon in [place] that she had put a lot of faith in as being somebody would give her an opinion that she would trust. I talked to her today and said even if I could get her in to another orthopedic surgeon about the leg which she accept an amputation and she said she would not therefore I am not going to press this issue for the  moment 12/14; continued improvement in his substantial wound on the right medial foot. There  is still a necrotic area inferiorly with tightly adherent necrotic debris which I have been working on debriding each time she is here. She does not have an orthopedic appointment. Since last time she was here I looked over her cultures which were essentially MRSA on the foot wound and gram-negative rods in the abscess on the anterior leg. 12/21; continued improvement in the area on the right medial foot. She is not up on this much and that is probably a good thing since I do not know it could support continuous ambulation. She has a small area on the right lateral leg which were remanence of the IandD's I did because of the abscess. I think she should probably have prophylactic antibiotics I am going to have to look this over to see if we can make an intelligent decision here. In the meantime her major wound is come down nicely. Necrotic area inferiorly is still there but looks a lot better 04/06/2019; she has had some improvement in the overall surface area on the right medial foot somewhat narrowedr both but somewhat longer. The areas on the right lateral leg which were initial IandD sites are superficial. Nothing is present on the right heel. We are using silver alginate to the wound areas 1/18; right medial foot somewhat smaller. Still a deep probing area in the most distal recess of the wound. She has nothing open on the right leg. She has a new wound on the plantar aspect of her left fourth toe which may have come from just pulling skin. The patient using Medihoney on the wound on her foot under silver alginate. I cannot discourage her from this 2/1; 2-week follow-up using silver alginate on the right foot and her left fourth toe. The area on the right dorsal foot is contracted although there is still the deep area in the most distal part of the wound but still has some probing depth. No overt  infection 2/15; 2-week follow-up. She continues to have improvement in the surface area on the dorsal right foot. Even the tunneling area from last time is almost closed. The area that was on the plantar part of her left fourth toe over the PIP is indeed closed 3/1; 2-week follow-up. Continued improvement in surface area. The original divot that we have been debriding inferiorly I think has full epithelialization although the epithelialization is gone down into the wound with probably 4 mm of depth. Even under intense illumination I am unable to see anything open here. The remanence of the wound in this area actually look quite healthy. We have been using silver alginate 3/15; 2-week follow-up. Unfortunately not as good today. She has a comma shaped wound on the dorsal foot however the upper part of this is larger. Under illumination debris on the surface She also tells [place] that she was on her right leg 2 times in the last couple of weeks mostly to reach up for things above her head etc. She felt a sharp pain in the right leg which she thinks is somewhere from the ankle to the knee. The patient has neuropathy and is really uncertain. She cannot feel her foot so she does not think it was coming from there 3/29; 2-week follow-up. Her wound measures smaller. Surface of the wound appears reasonable. She is using silver alginate with underlying Medihoney. She has home health. X-rays I did of her tib-fib last time were negative although it did show arterial calcification 4/12; 2-week follow-up. Her wound measures smaller in length.  Using manuka honey with silver alginate on top. She has home health. 4/26; 2-week follow-up. Her wound is smaller but still very adherent debris under illumination requiring debridement she has been using manuka honey with silver alginate. She has home health 08/28/19-Wound has about the same size, but with a layer of eschar at the lateral edge of the amputation site on the right  foot. Been using Hydrofera Blue. She is on suppressive Bactrim but apparently she has been taking it twice daily 6/7; I have not seen this wound and about 6 weeks. Since then she was up in [place]. By her own admission she was walking on the foot because she did not have a wheelchair. The wound is not nearly as healthy looking as it was the last time I saw this. We ordered different things for her but she only uses Medihoney and silver alginate. As far as I know she is on suppressive trimethoprim sulfamethoxazole. She does not admit to any fever or chills. Her CBGs apparently are at baseline however she is saying that she feels some discomfort on the lateral part of her ankle I looked over her last inflammatory markers from the summer 2020 at which time she had a deeply necrotic infected wound in this area. On 11/10/2018 her sedimentation rate was 56 and C-reactive protein 9.9. This was 107 and 29 on 07/29/2018. 6/17; the patient had a necrotic wound the last time she was here on the right dorsal foot. After debridement I did a culture. This showed a very resistant ESBL Klebsiella as well as Enterococcus. Her x-ray of the foot which was done because of warmth and some discomfort showed bone destruction within the carpal bones involving the navicular acute cuboid lateral middle cuneiforms but essentially unchanged from her prior study which was done on 10/29/2018. The findings were felt to represent chronic osteomyelitis. We did inflammatory markers on her. Her white count was 5.25 sedimentation rate 16 and C-reactive protein at 11.1. Notable for the fact that in August 2020 her CRP was 9.9 and sedimentation rate 56. I have looked at her x-rays. It is true that the bone destruction is very impressive however the patient came into this clinic for the wound on her right foot with pieces of bone literally falling out anteriorly with purulent material. I am not exactly sure I could have expected anything  different. She has not been systemically unwell no fever chills or blood sugars have been reasonable. 6/28; she arrives with a right heel closed. The substantial area on the right anterior foot looks healthy. Much better looking surface. I think we can change to [facility] seems to help this previously. She is getting her antibiotics at dialysis she should be just about finished 7/9; changed to [facility] last week. Surface wound looks satisfactory not much change in surface area however. She is going to [place] state next week this is usually a difficult thing for this patient follow-up will be for 2 weeks. 7/23; using Hydrofera Blue. She returns from her trip and the wound looks surprisingly good. Usually when this patient goes on trips she comes back with a lot of problems with the wound. She is saying that she sometimes feels an episodic "crunching" feeling on the lateral part of the foot. She is neuropathic and not feeling pain but wonders whether this could be a neuropathic dysesthesia. 11/13/19-Patient returns after 3 weeks, the wound itself is stable and patient states that there is nothing new going on she is on  some extra anxiety medications and is resisting the temptation to pick at the dry skin around the wound. 9/20; patient has not been here in over a month and I have not seen her in 2 months. The wound in terms of size I think is about the same. There is no exposed bone. She has a nonviable surface on this. She is supposed to be using [facility] however she is also been using some form of honey preparation as well as a silver-based dressings. I do not think she has any pattern to this. 10/4; 2-week follow-up. Patient has been using some form of spray which she says has honey and silver to purchase this online she has been covering it with gauze. In spite of this the wound actually looks quite good. The deeper divot distally appears to be close down. There is a rim of  epithelialization. 10/18; 2-week follow-up. Patient has been using her Hydrofera Blue covered with her silver honey spray that she got online. 11/1; 2-week follow-up. She is using Hydrofera Blue with a silver honey spray. Wound bed is measuring smaller. She has noticed that her foot is warmer on the right. She is concerned about infection. For a long period of time I had her on prophylactic trimethoprim sulfamethoxazole DS 1 tablet daily. She is asking for this to be restarted. The patient is walking on this foot because of repairs that are being done in a home her but her room is on the second floor she has to go up and down stairs. I have cautioned against this however as usual she will do exactly what she wants to do 11/15; 2-week follow-up. She uses Hydrofera Blue with a silver/honey spray which I have never heard of. I think her wound looks about the same. Some epithelialization. No evidence that this is infected. I think she is walking on this more than we agreed on. She is going on extensive vacation over Thanksgiving 12/3; 2-week follow-up. She is using Hydrofera Blue however over Thanksgiving she ran out of this and she is simply been using Medihoney. In spite of this her wound is smaller almost divided into 2 now. She traveled extensively over Thanksgiving and actually looks quite good in spite of this. Usually this is been a marker of problems for her 12/17; 2-week follow-up. She is using Hydrofera Blue. The wound is smaller. Debris on the surface of this is fibrinous. She is traveling to [place] over the holidays which never bodes well for her wounds. I think she is walking more on her feet then she is even willing to admit and she tells me she does walk 2/11; using Hydrofera Blue. Her wounds are contracting however she walks in the clinic with a history that she has not been able to eat she has had vomiting. She also has right eye problems. Her blood sugar was 567. She had blood work  done at dialysis and we called there to get her blood work although it still had not returned although they should have this by the end of the day we were told. She also stated that she was not sure what her blood sugar was as her glucose monitor was not working and not coming to tomorrow. She also for some reason does not think her insulin pump is working well. Her endocrinologist is Dr. Elayne Snare 06/03/2020 upon evaluation today patient actually appears to be doing excellent in regard to her wound. In fact she tells me it was not even bleeding  until she picked a dry piece of skin off while we were getting ready to come in and see her today. Fortunately there is no signs of active infection which is great news and overall very pleased. She is going to be seeing her neurologist sometime shortly I believe it might even be on Monday. Nonetheless there can proceed with the work-up as far as anything else going on with her eye the fact that her right eyelid is drooping. 3/25; right medial dorsal foot. She has superficial areas here that appear to be fully epithelialized she is using Medihoney and silver alginate and some combination. ooShe has a new area on the mid left foot at roughly the level of the fifth metatarsal base 4/84/8; right medial dorsal foot. Still superficial areas here. I cleaned up 1 of these today she has been using I think mostly Medihoney but I would like her to use silver alginate. The area on the mid part of her foot also looks improved on the left. Since she was last here she tells me that she noted pus in the area of her left foot. She told dialysis about this they did a culture and ultimately she is on IV antibiotics but were not sure which one. They referred her to [name] he said that he did not need to follow her. I have not really verified any of this and I do not know what antibiotic she is on at dialysis 4/29; patient has been using Medihoney to her wounds. She reports  taking off the toenail to her left second toe. She states that home health discharged her and she has not had help with dressing changes. She denies any signs and symptoms of infection. 5/13; 2-week follow-up. Miraculously the wound on the right foot dorsally is healed. She has an area on the tip of her left third toe and an area in the left midfoot. 5/26; patient presents for 2-week follow-up. She has 2 open wounds 1 to her plantar foot and the other to the left third toe. She has noticed increased swelling and redness to the toe wound. It is unclear exactly what she is doing for dressing changes as she has multiple dressing options at home. She states that it is easiest to do [facility] on the plantar wound and Prisma or Medihoney on the toe wound. 6/3; saw Dr. Maryjane Hurter week. She put her on Keflex because of the swelling I think of the left second toe. She arrives today with new breakdown x3 on the dorsal right foot which is disappointing. She also complains of pain in the right ankle, 3 liquid stools per day, chills without fever 6/17; patient was in [place]. I think a little more ambulatory than I would like to hear. In any case she still has the 3 small open areas on the dorsal part of her foot on the right medial leg these look about the same as last time. She is supposed to be using Prisma but I think used Medihoney. She has an area on the left lateral plantar foot which had undermining under her skin and a blood blister with the 2 areas communicating today. And finally an area on the tip of her left second toe 6/30; patient has 3 small areas that look better than the last time on the right dorsal foot. She uses a combination of her products including collagen, Hydrofera Blue and the Medihoney nevertheless they look better. The area on the left midfoot might have been healed  although she pulled some skin off superficial open area. The left second toe still does not look that healthy. Open  areas on the tips of the 7/21; the areas on her right dorsal foot are still present although they look benign. We are using silver collagen although she may be using any of the products she had at home. The real problem here is the left second toe. Necrotic wound over the distal phalanx and distal interphalangeal joint. This probes directly to bone. She arrives in clinic today with an interesting story. She went to [place] last week. She became ill there with refractory nausea and vomiting. She went to an urgent care and then the ER. I am able to see parts of this in Care Everywhere. She had 2 blood cultures done both of which ultimately showed MRSA. This was sensitive to daptomycin and linezolid and vancomycin. Her white count was 4.8 hemoglobin 10.4. X-ray of the left foot showed prior resection of the left fifth metatarsal no evidence of osteomyelitis. Her C-reactive protein on 10/13/2020 was 104.8. She refused admission to the hospital as she was to board her flight to return to [place]. She dialyzed this morning I am not certain why she did not bring this to their attention 7/27; fortunately the left second toe and the erythema in her left foot looks a lot better on the IV vancomycin at dialysis. I was able to verify earlier this week that she had indeed received it on the weekend starting last Saturday. She will need at least 4 weeks. I saw her left arm shunt and it looks fine. We are using silver alginate on the toe and we have been using I think Medihoney on the right foot which is actually been her decision. She has 2 open wounds dorsally. She does not describe fever chills her blood sugars are erratic but seem to be at her baseline 8/18; she has completed her antibiotics at dialysis but is unsure how long this was ordered. I have asked her to check when she goes to dialysis on Saturday. She is using polymen on the right foot dorsally and silver collagen not silver alginate to the left  second toe. Her blood pressure is high today but blood sugars have been stable according to her she is having no fever or systemic symptoms 9/14; about a month since we have seen her. In the interim she was admitted to hospital from 11/21/2020 through 12/01/2020 with delirium secondary to sepsis. She was felt to have sepsis from a left second toe MRSA infection. She is on IV vancomycin and I think will complete 6 weeks of this on 12/29/2020. Her blood cultures were negative. A transthoracic echo did not show any vegetations. I do not believe she had a TEE. She comes in today with a sizable wound on the right medial foot with some necrotic surface in the mid part of this and also a wound on the tip of her left second toe. She has been using Medihoney 10/18; I have not seen this woman in about a month. In the meantime her left second toe seems to have healed over although likely that there has been bone or joint destruction in the distal part of the second toe. The area on the right lateral foot is worse. Our intake nurses noted green drainage. Not completely clear as usual what she is using here. She tells me that she did see ground-up some antibiotics likely cephalexin although I am not sure about this  and she took 2 days worth. She had called some weeks ago about the amount of antibiotics she got for her MRSA sepsis. We referred her back to dialysis but she claims that she really only got a week of therapy. I find that hard to believe Objective Constitutional Sitting or standing Blood Pressure is within target range for patient.. Pulse regular and within target range for patient.Marland Kitchen Respirations regular, non-labored and within target range.. Temperature is normal and within the target range for the patient.Marland Kitchen Appears in no distress. Vitals Time Taken: 1:28 PM, Height: 67 in, Weight: 125 lbs, BMI: 19.6, Temperature: 98.7 F, Pulse: 74 bpm, Respiratory Rate: 17 breaths/min, Blood Pressure: 134/70 mmHg,  Capillary Blood Glucose: 240 mg/dl. General Notes: Wound exam; left plantar foot remains healed. Her left second toe is also healed although appears to have a significant amount of joint destruction secondary to osteomyelitis. oo The real problem here is on the right medial foot completely necrotic surface I debrided with a #5 curette hemostasis with direct pressure. After debridement I did a swab culture Integumentary (Hair, Skin) Wound #51 status is Open. Original cause of wound was Trauma. The date acquired was: 07/29/2020. The wound has been in treatment 24 weeks. The wound is located on the Left T Second. The wound measures 0cm length x 0cm width x 0cm depth; 0cm^2 area and 0cm^3 volume. There is Fat Layer (Subcutaneous oe Tissue) exposed. There is a medium amount of serosanguineous drainage noted. The wound margin is distinct with the outline attached to the wound base. There is large (67-100%) red granulation within the wound bed. There is no necrotic tissue within the wound bed. Wound #52 status is Open. Original cause of wound was Gradually Appeared. The date acquired was: 09/02/2020. The wound has been in treatment 19 weeks. The wound is located on the Right Foot. The wound measures 2.7cm length x 4cm width x 0.1cm depth; 8.482cm^2 area and 0.848cm^3 volume. There is Fat Layer (Subcutaneous Tissue) exposed. There is no tunneling or undermining noted. There is a medium amount of serosanguineous drainage noted. The wound margin is distinct with the outline attached to the wound base. There is medium (34-66%) pink granulation within the wound bed. There is a medium (34-66%) amount of necrotic tissue within the wound bed. 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 There was a Excisional Skin/Subcutaneous Tissue Debridement with a total area of 10.8 sq cm performed by Ricard Dillon., MD. With the following instrument(s): Curette to remove Viable and Non-Viable tissue/material. Material removed includes Subcutaneous Tissue, Slough, Skin: Dermis, and Skin: Epidermis after achieving pain control using Lidocaine. 1 specimen was taken by a Tissue Culture and sent to the lab per facility protocol. A time out was conducted at 14:00, prior to the start of the procedure. A Minimum amount of bleeding was controlled with Pressure. The procedure was tolerated well with a pain level of 0 throughout and a pain level of 0 following the procedure. Post Debridement Measurements: 2.7cm length x 4cm width x 0.1cm depth; 0.848cm^3 volume. Character of Wound/Ulcer Post Debridement is improved. Severity of Tissue Post Debridement  is: Fat layer exposed. Post procedure Diagnosis Wound #52: Same as Pre-Procedure Plan Follow-up Appointments: Return appointment in 1 month. - Dr. Dellia Nims Bathing/ Shower/ Hygiene: May shower and wash wound with soap and water. Edema Control - Lymphedema / SCD / Other: Elevate legs to the level of the heart or above for 30 minutes daily and/or when sitting, a frequency of: - throughout the day Avoid standing for long periods of time. Moisturize legs daily. - with dressing changes Off-Loading: Open toe surgical shoe to: - to both feet Other: - minimal weight bearing right foot Laboratory ordered were: Anerobic culture - right foot WOUND #52: - Foot Wound Laterality: Right Cleanser: Soap and Water Every Other Day/30 Days Discharge Instructions: May shower and wash wound with dial antibacterial soap and water prior to dressing change. Cleanser: Wound  Cleanser (DME) (Generic) Every Other Day/30 Days Discharge Instructions: Cleanse the wound with wound cleanser prior to applying a clean dressing using gauze sponges, not tissue or cotton balls. Prim Dressing: KerraCel Ag Gelling Fiber Dressing, 4x5 in (silver alginate) (DME) (Generic) Every Other Day/30 Days ary Discharge Instructions: Apply silver alginate to wound bed as instructed Secondary Dressing: Woven Gauze Sponge, Non-Sterile 4x4 in (DME) (Generic) Every Other Day/30 Days Discharge Instructions: Apply over primary dressing as directed. Secondary Dressing: ABD Pad, 5x9 (DME) (Generic) Every Other Day/30 Days Discharge Instructions: Apply over primary dressing as directed. Secured With: Elastic Bandage 4 inch (ACE bandage) (DME) (Generic) Every Other Day/30 Days Discharge Instructions: Secure with ACE bandage as directed. Secured With: [employer], 4.5x3.1 (in/yd) (DME) (Generic) Every Other Day/30 Days Discharge Instructions: Secure with Kerlix as directed. Secured With: Paper T ape, 1x10 (in/yd) (DME) (Generic) Every Other Day/30 Days Discharge Instructions: Secure dressing with tape as directed. 1. I am using silver alginate on the right foot wound which seems worse than when I last saw this a month ago. 2. Swab culture for CandS but no empiric antibiotics 3. The patient is leaving on 26 October for [place] and then [place]. 4. I really do not know what to say about the duration of the vancomycin she received. There was fairly clear instructions from Dr. Drucilla Schmidt that that be 6 weeks. She says she only received a week although I am just not sure nor do I think this will be easy for me to track down Electronic Signature(s) Signed: 01/17/2021 4:56:56 PM By: Linton Ham MD Entered By: Linton Ham on 01/17/2021 14:18:02 -------------------------------------------------------------------------------- SuperBill Details Patient Name: Date of Service: [name] 01/17/2021 Medical Record Number: [id number] Patient Account Number: [id number] Date of Birth/Sex: Treating RN: Jul 29, 1970 (50 y.o. 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 M86.172 Other acute osteomyelitis, left ankle and foot Facility Procedures CPT4 Code: [id number] Description: 11042 - DEB SUBQ TISSUE 20 SQ CM/< ICD-10 Diagnosis Description L97.514 Non-pressure chronic ulcer of other part of right foot with necrosis of bone Modifier: Quantity: 1 Physician Procedures : CPT4 Code Description Modifier [id number] 11042 - WC PHYS SUBQ TISS 20 SQ CM ICD-10 Diagnosis Description L97.514 Non-pressure chronic ulcer of other part of right foot with necrosis of bone Quantity: 1  Electronic Signature(s) Signed: 01/17/2021 4:56:56 PM By: Linton Ham MD Entered By: Linton Ham on 01/17/2021 14:18:18

## 2021-01-17 NOTE — Progress Notes (Signed)
ADASHA, BOEHME ([id number]) Visit Report for 01/17/2021 Arrival Information Details Patient Name: Date of Service: [name] 01/17/2021 1:30 PM Medical Record Number: [id number] Patient Account Number: [id number] Date of Birth/Sex: Treating RN: [birth year] (50 y.o. [name], [name] Primary Care Boruch Manuele: [name], [name] Other Clinician: Referring Ghada Abbett: Treating Latravious Levitt/Extender: [name], [name] Weeks in Treatment: 32 Visit Information History Since Last Visit Added or deleted any medications: No Patient Arrived: Wheel Chair Any new allergies or adverse reactions: No Arrival Time: 13:20 Had a fall or experienced change in No Accompanied By: self activities of daily living that may affect Transfer Assistance: Manual risk of falls: Patient Identification Verified: Yes Signs or symptoms of abuse/neglect since last visito No Secondary Verification Process Completed: Yes Hospitalized since last visit: No Patient Requires Transmission-Based Precautions: No Implantable device outside of the clinic excluding No Patient Has Alerts: No cellular tissue based products placed in the center since last visit: Has Dressing in Place as Prescribed: Yes Pain Present Now: No Electronic Signature(s) Signed: 01/17/2021 4:50:06 PM By: Rhae Hammock RN Entered By: Rhae Hammock on 01/17/2021 13:21:21 -------------------------------------------------------------------------------- Encounter Discharge Information Details Patient Name: Date of Service: [place], [name] 01/17/2021 1:30 PM Medical Record Number: [id number] Patient Account Number: [id number] Date of Birth/Sex: Treating RN: [birth year] (50 y.o. [name], [name] Primary Care Riham Polyakov: [name], [name] Other Clinician: Referring Tanicia Wolaver: Treating Di Jasmer/Extender: [name], [name] Weeks in Treatment: 123 Encounter Discharge Information Items Post Procedure  Vitals Discharge Condition: Stable Temperature (F): 97.8 Ambulatory Status: Wheelchair Pulse (bpm): 74 Discharge Destination: Home Respiratory Rate (breaths/min): 17 Transportation: Private Auto Blood Pressure (mmHg): 133/68 Accompanied By: self Schedule Follow-up Appointment: Yes Clinical Summary of Care: Patient Declined Electronic Signature(s) Signed: 01/17/2021 4:50:06 PM By: Rhae Hammock RN Entered By: Rhae Hammock on 01/17/2021 14:09:36 -------------------------------------------------------------------------------- Lower Extremity Assessment Details Patient Name: Date of Service: [name] 01/17/2021 1:30 PM Medical Record Number: [id number] Patient Account Number: [id number] Date of Birth/Sex: Treating RN: 1971/03/13 (50 y.o. [name], [name] Primary Care Kili Gracy: [name], [name] Other Clinician: Referring Breyson Kelm: Treating Roberts Bon/Extender: [name], [name] Weeks in Treatment: 123 Edema Assessment Assessed: [Left: Yes] [Right: Yes] Edema: [Left: No] [Right: No] Calf Left: Right: Point of Measurement: 30 cm From Medial Instep 29 cm 28 cm Ankle Left: Right: Point of Measurement: 9 cm From Medial Instep 21 cm 19.5 cm Vascular Assessment Pulses: Dorsalis Pedis Palpable: [Left:Yes] [Right:Yes] Posterior Tibial Palpable: [Left:Yes] [Right:Yes] Electronic Signature(s) Signed: 01/17/2021 4:50:06 PM By: Rhae Hammock RN Entered By: Rhae Hammock on 01/17/2021 13:30:33 -------------------------------------------------------------------------------- Multi Wound Chart Details Patient Name: Date of Service: [name] 01/17/2021 1:30 PM Medical Record Number: [id number] Patient Account Number: [id number] Date of Birth/Sex: Treating RN: [birth year] (50 y.o. [name], [name] Primary Care Jayvien Rowlette: [name], [name] Other Clinician: Referring Talha Iser: Treating Gracianna Vink/Extender: [name], [name] Weeks in Treatment: 123 Vital Signs Height(in): 67 Capillary Blood Glucose(mg/dl): 240 Weight(lbs): 125 Pulse(bpm): 79 Body Mass Index(BMI): 20 Blood Pressure(mmHg): 134/70 Temperature(F): 98.7 Respiratory Rate(breaths/min): 17 Photos: [51:Left T Second oe] [52:Right Foot] [N/A:N/A N/A] Wound Location: [51:Trauma] [52:Gradually Appeared] [N/A:N/A] Wounding Event: [51:Diabetic Wound/Ulcer of the Lower] [52:Diabetic Wound/Ulcer of the Lower] [N/A:N/A] Primary Etiology: [51:Extremity Cataracts, Chronic sinus] [52:Extremity Cataracts, Chronic sinus] [N/A:N/A] Comorbid History: [51:problems/congestion, Anemia, Sleep problems/congestion, Anemia, Sleep Apnea, Deep Vein Thrombosis, Hypertension, Peripheral Arterial Disease, Type  I Diabetes, Osteoarthritis, Osteomyelitis, Neuropathy, Seizure Disorder 07/29/2020]  [52:Apnea, Deep Vein Thrombosis, Hypertension, Peripheral Arterial Disease, Type I Diabetes, Osteoarthritis, Osteomyelitis, Neuropathy, Seizure Disorder 09/02/2020] [N/A:N/A] Date Acquired: [51:24] [52:19] [N/A:N/A] Weeks of Treatment: [51:Open] [52:Open] [N/A:N/A] Wound Status: [51:No] [52:Yes] [N/A:N/A] Clustered Wound: [51:N/A] [52:2] [N/A:N/A] Clustered Quantity: [51:0x0x0] [52:2.7x4x0.1] [N/A:N/A] Measurements L x W x D (cm) [51:0] [52:8.482] [N/A:N/A] A (cm) : rea [51:0] [52:0.848] [N/A:N/A] Volume (cm) : [51:100.00%] [52:-172.70%] [N/A:N/A] % Reduction in A [51:rea: 100.00%] [52:-36.30%] [N/A:N/A] % Reduction in Volume: [51:Grade 2] [52:Grade 2] [N/A:N/A] Classification: [51:Medium] [52:Medium] [N/A:N/A] Exudate A mount: [51:Serosanguineous] [52:Serosanguineous] [N/A:N/A] Exudate Type: [51:red, brown] [52:red, brown] [N/A:N/A] Exudate Color: [51:Distinct, outline attached] [52:Distinct, outline attached] [N/A:N/A] Wound Margin: [51:Large (67-100%)] [52:Medium (34-66%)] [N/A:N/A] Granulation A mount: [51:Red] [52:Pink] [N/A:N/A] Granulation Quality:  [51:None Present (0%)] [52:Medium (34-66%)] [N/A:N/A] Necrotic A mount: [51:Fat Layer (Subcutaneous Tissue): Yes Fat Layer (Subcutaneous Tissue): Yes N/A] Exposed Structures: [51:Fascia: No Tendon: No Muscle: No Joint: No Bone: No None] [52:Fascia: No Tendon: No Muscle: No Joint: No Bone: No Small (1-33%)] [N/A:N/A] Epithelialization: [51:N/A] [52:Debridement - Excisional] [N/A:N/A] Debridement: Pre-procedure Verification/Time Out N/A [52:14:00] [N/A:N/A] Taken: [51:N/A] [52:Lidocaine] [N/A:N/A] Pain Control: [51:N/A] [52:Subcutaneous, Slough] [N/A:N/A] Tissue Debrided: [51:N/A] [52:Skin/Subcutaneous Tissue] [N/A:N/A] Level: [51:N/A] [52:10.8] [N/A:N/A] Debridement A (sq cm): [51:rea N/A] [52:Curette] [N/A:N/A] Instrument: [51:N/A] [52:Minimum] [N/A:N/A] Bleeding: [51:N/A] [52:Pressure] [N/A:N/A] Hemostasis A chieved: [51:N/A] [52:0] [N/A:N/A] Procedural Pain: [51:N/A] [52:0] [N/A:N/A] Post Procedural Pain: [51:N/A] [52:Procedure was tolerated well] [N/A:N/A] Debridement Treatment Response: [51:N/A] [52:2.7x4x0.1] [N/A:N/A] Post Debridement Measurements L x W x D (cm) [51:N/A] [52:0.848] [N/A:N/A] Post Debridement Volume: (cm) [51:N/A] [52:Debridement] [N/A:N/A] Treatment Notes Wound #51 (Toe Second) Wound Laterality: Left Cleanser Peri-Wound Care Topical Primary Dressing Secondary Dressing Secured With Compression Wrap Compression Stockings Add-Ons Wound #52 (Foot) Wound Laterality: Right Cleanser Soap and Water Discharge Instruction: May shower and wash wound with dial antibacterial soap and water prior to dressing change. Wound Cleanser Discharge Instruction: Cleanse the wound with wound cleanser prior to applying a clean dressing using gauze sponges, not tissue or cotton balls. Peri-Wound Care Topical Primary Dressing KerraCel Ag Gelling Fiber Dressing, 4x5 in (silver alginate) Discharge Instruction: Apply silver alginate to wound bed as instructed Secondary  Dressing Woven Gauze Sponge, Non-Sterile 4x4 in Discharge Instruction: Apply over primary dressing as directed. ABD Pad, 5x9 Discharge Instruction: Apply over primary dressing as directed. Secured With Elastic Bandage 4 inch (ACE bandage) Discharge Instruction: Secure with ACE bandage as directed. Kerlix Roll Sterile, 4.5x3.1 (in/yd) Discharge Instruction: Secure with Kerlix as directed. Paper Tape, 1x10 (in/yd) Discharge Instruction: Secure dressing with tape as directed. Compression Wrap Compression Stockings Add-Ons Electronic Signature(s) Signed: 01/17/2021 4:50:06 PM By: Rhae Hammock RN Signed: 01/17/2021 4:56:56 PM By: Linton Ham MD Entered By: Linton Ham on 01/17/2021 14:13:08 -------------------------------------------------------------------------------- Multi-Disciplinary Care Plan Details Patient Name: Date of Service: [place] LLO [name] 01/17/2021 1:30 PM Medical Record Number: [id number] Patient Account Number: [id number] Date of Birth/Sex: Treating RN: February 01, 1971 (50 y.o. [name], [name] Primary Care Rogelio Winbush: [name], [name] Other Clinician: Referring Noelie Renfrow: Treating Emmani Lesueur/Extender: [name], [name] Weeks in Treatment: [place] reviewed with physician Active Inactive Nutrition Nursing Diagnoses: Imbalanced nutrition Impaired glucose control: actual or potential Potential for alteratiion in Nutrition/Potential for imbalanced nutrition Goals: Patient/caregiver agrees to and verbalizes understanding of need to use nutritional supplements and/or vitamins as prescribed Date Initiated: 09/03/2018 Date Inactivated: 10/07/2018 Target Resolution Date: 10/01/2018 Goal Status: Met Patient/caregiver will maintain therapeutic glucose control Date Initiated: 09/03/2018  Target Resolution Date: 01/28/2021 Goal Status: Active Interventions: Provide education on elevated blood sugars and impact on wound  healing Treatment Activities: Patient referred to Primary Care Physician for further nutritional evaluation : 09/03/2018 Notes: 07/29/20: Glucose control ongoing, target date extended. Wound/Skin Impairment Nursing Diagnoses: Impaired tissue integrity Knowledge deficit related to ulceration/compromised skin integrity Goals: Patient/caregiver will verbalize understanding of skin care regimen Date Initiated: 09/03/2018 Target Resolution Date: 01/28/2021 Goal Status: Active Ulcer/skin breakdown will have a volume reduction of 30% by week 4 Date Initiated: 09/03/2018 Date Inactivated: 10/07/2018 Target Resolution Date: 10/01/2018 Goal Status: Unmet Unmet Reason: COMORBITIES Ulcer/skin breakdown will have a volume reduction of 50% by week 8 Date Initiated: 10/07/2018 Date Inactivated: 11/03/2018 Target Resolution Date: 10/31/2018 Goal Status: Unmet Unmet Reason: Osteomyelitis Interventions: Assess patient/caregiver ability to obtain necessary supplies Assess patient/caregiver ability to perform ulcer/skin care regimen upon admission and as needed Assess ulceration(s) every visit Provide education on ulcer and skin care Treatment Activities: Skin care regimen initiated : 09/03/2018 Topical wound management initiated : 09/03/2018 Notes: Electronic Signature(s) Signed: 01/17/2021 4:50:06 PM By: Rhae Hammock RN Entered By: Rhae Hammock on 01/17/2021 14:07:51 -------------------------------------------------------------------------------- Pain Assessment Details Patient Name: Date of Service: [name] 01/17/2021 1:30 PM Medical Record Number: [id number] Patient Account Number: [id number] Date of Birth/Sex: Treating RN: 1970-11-03 (50 y.o. [name], [name] Primary Care Dontavious Emily: [name], [name] Other Clinician: Referring Cecila Satcher: Treating Kataleena Holsapple/Extender: [name], [name] Weeks in Treatment: 123 Active Problems Location of Pain Severity and  Description of Pain Patient Has Paino No Site Locations Pain Management and Medication Current Pain Management: Electronic Signature(s) Signed: 01/17/2021 4:50:06 PM By: Rhae Hammock RN Entered By: Rhae Hammock on 01/17/2021 13:28:38 -------------------------------------------------------------------------------- Patient/Caregiver Education Details Patient Name: Date of Service: [place] LLO [place] Y, [place] 10/18/2022andnbsp1:30 PM Medical Record Number: [id number] Patient Account Number: [id number] Date of Birth/Gender: Treating RN: [birth year] (50 y.o. [name], [name] Primary Care Physician: [name], [name] Other Clinician: Referring Physician: Treating Physician/Extender: [name], [name] Weeks in Treatment: 76 Education Assessment Education Provided To: Patient Education Topics Provided Elevated Blood Sugar/ Impact on Healing: Methods: Explain/Verbal Responses: State content correctly Wound/Skin Impairment: Methods: Explain/Verbal Responses: State content correctly Electronic Signature(s) Signed: 01/17/2021 4:50:06 PM By: Rhae Hammock RN Entered By: Rhae Hammock on 01/17/2021 14:08:17 -------------------------------------------------------------------------------- Wound Assessment Details Patient Name: Date of Service: [name] 01/17/2021 1:30 PM Medical Record Number: [id number] Patient Account Number: [id number] Date of Birth/Sex: Treating RN: 10/16/1970 (50 y.o. [name], [name] Primary Care Jashiya Bassett: [name], [name] Other Clinician: Referring Kadijah Shamoon: Treating Izaih Kataoka/Extender: [name], [name] Weeks in Treatment: 123 Wound Status Wound Number: 89 Primary Diabetic Wound/Ulcer of the Lower Extremity Etiology: Wound Location: Left T Second oe Wound Open Wounding Event: Trauma Status: Date Acquired: 07/29/2020 Comorbid Cataracts, Chronic sinus problems/congestion, Anemia,  Sleep Weeks Of Treatment: 24 History: Apnea, Deep Vein Thrombosis, Hypertension, Peripheral Arterial Clustered Wound: No Disease, Type I Diabetes, Osteoarthritis, Osteomyelitis, Neuropathy, Seizure Disorder Photos Wound Measurements Length: (cm) 0 Width: (cm) 0 Depth: (cm) 0 Area: (cm) 0 Volume: (cm) 0 % Reduction in Area: 100% % Reduction in Volume: 100% Epithelialization: None Wound Description Classification: Grade 2 Wound Margin: Distinct, outline attached Exudate Amount: Medium Exudate Type: Serosanguineous Exudate Color: red, brown Foul Odor After Cleansing: No Slough/Fibrino No Wound Bed Granulation Amount: Large (67-100%) Exposed Structure Granulation Quality: Red Fascia Exposed: No Necrotic Amount: None Present (0%)  Fat Layer (Subcutaneous Tissue) Exposed: Yes Tendon Exposed: No Muscle Exposed: No Joint Exposed: No Bone Exposed: No Electronic Signature(s) Signed: 01/17/2021 4:50:06 PM By: Rhae Hammock RN Entered By: Rhae Hammock on 01/17/2021 13:41:28 -------------------------------------------------------------------------------- Wound Assessment Details Patient Name: Date of Service: [name] 01/17/2021 1:30 PM Medical Record Number: [id number] Patient Account Number: [id number] Date of Birth/Sex: Treating RN: Jul 05, 1970 (50 y.o. [name], [name] Primary Care Rain Wilhide: [name], [name] Other Clinician: Referring Thelma Viana: Treating Sharetha Newson/Extender: [name], [name] Weeks in Treatment: 123 Wound Status Wound Number: 52 Primary Diabetic Wound/Ulcer of the Lower Extremity Etiology: Wound Location: Right Foot Wound Open Wounding Event: Gradually Appeared Status: Date Acquired: 09/02/2020 Comorbid Cataracts, Chronic sinus problems/congestion, Anemia, Sleep Weeks Of Treatment: 19 History: Apnea, Deep Vein Thrombosis, Hypertension, Peripheral Arterial Clustered Wound: Yes Disease, Type I Diabetes,  Osteoarthritis, Osteomyelitis, Neuropathy, Seizure Disorder Photos Wound Measurements Length: (cm) 2.7 Width: (cm) 4 Depth: (cm) 0.1 Clustered Quantity: 2 Area: (cm) 8.482 Volume: (cm) 0.848 % Reduction in Area: -172.7% % Reduction in Volume: -36.3% Epithelialization: Small (1-33%) Tunneling: No Undermining: No Wound Description Classification: Grade 2 Wound Margin: Distinct, outline attached Exudate Amount: Medium Exudate Type: Serosanguineous Exudate Color: red, brown Foul Odor After Cleansing: No Slough/Fibrino Yes Wound Bed Granulation Amount: Medium (34-66%) Exposed Structure Granulation Quality: Pink Fascia Exposed: No Necrotic Amount: Medium (34-66%) Fat Layer (Subcutaneous Tissue) Exposed: Yes Tendon Exposed: No Muscle Exposed: No Joint Exposed: No Bone Exposed: No Treatment Notes Wound #52 (Foot) Wound Laterality: Right Cleanser Soap and Water Discharge Instruction: May shower and wash wound with dial antibacterial soap and water prior to dressing change. Wound Cleanser Discharge Instruction: Cleanse the wound with wound cleanser prior to applying a clean dressing using gauze sponges, not tissue or cotton balls. Peri-Wound Care Topical Primary Dressing KerraCel Ag Gelling Fiber Dressing, 4x5 in (silver alginate) Discharge Instruction: Apply silver alginate to wound bed as instructed Secondary Dressing Woven Gauze Sponge, Non-Sterile 4x4 in Discharge Instruction: Apply over primary dressing as directed. ABD Pad, 5x9 Discharge Instruction: Apply over primary dressing as directed. Secured With Elastic Bandage 4 inch (ACE bandage) Discharge Instruction: Secure with ACE bandage as directed. Kerlix Roll Sterile, 4.5x3.1 (in/yd) Discharge Instruction: Secure with Kerlix as directed. Paper Tape, 1x10 (in/yd) Discharge Instruction: Secure dressing with tape as directed. Compression Wrap Compression Stockings Add-Ons Electronic Signature(s) Signed:  01/17/2021 4:50:06 PM By: Rhae Hammock RN Entered By: Rhae Hammock on 01/17/2021 13:40:35 -------------------------------------------------------------------------------- Vitals Details Patient Name: Date of Service: [name] 01/17/2021 1:30 PM Medical Record Number: [id number] Patient Account Number: [id number] Date of Birth/Sex: Treating RN: 01/19/1971 (50 y.o. [name], [name] Primary Care Flavia Bruss: [name], [name] Other Clinician: Referring Zoanne Newill: Treating Emberlynn Riggan/Extender: [name], [name] Weeks in Treatment: 123 Vital Signs Time Taken: 13:28 Temperature (F): 98.7 Height (in): 67 Pulse (bpm): 74 Weight (lbs): 125 Respiratory Rate (breaths/min): 17 Body Mass Index (BMI): 19.6 Blood Pressure (mmHg): 134/70 Capillary Blood Glucose (mg/dl): 240 Reference Range: 80 - 120 mg / dl Electronic Signature(s) Signed: 01/17/2021 4:50:06 PM By: Rhae Hammock RN Entered By: Rhae Hammock on 01/17/2021 25:63:89

## 2021-01-23 LAB — AEROBIC/ANAEROBIC CULTURE W GRAM STAIN (SURGICAL/DEEP WOUND): Gram Stain: NONE SEEN

## 2021-02-01 IMAGING — DX DG FOOT COMPLETE 3+V*R*
3 series · 3 of 3 positions shown · non-contrast
Comparison: 10/29/2018

CLINICAL DATA: Chronic nonhealing ulcer

EXAM:
RIGHT FOOT COMPLETE - 3+ VIEW

[foot ap]
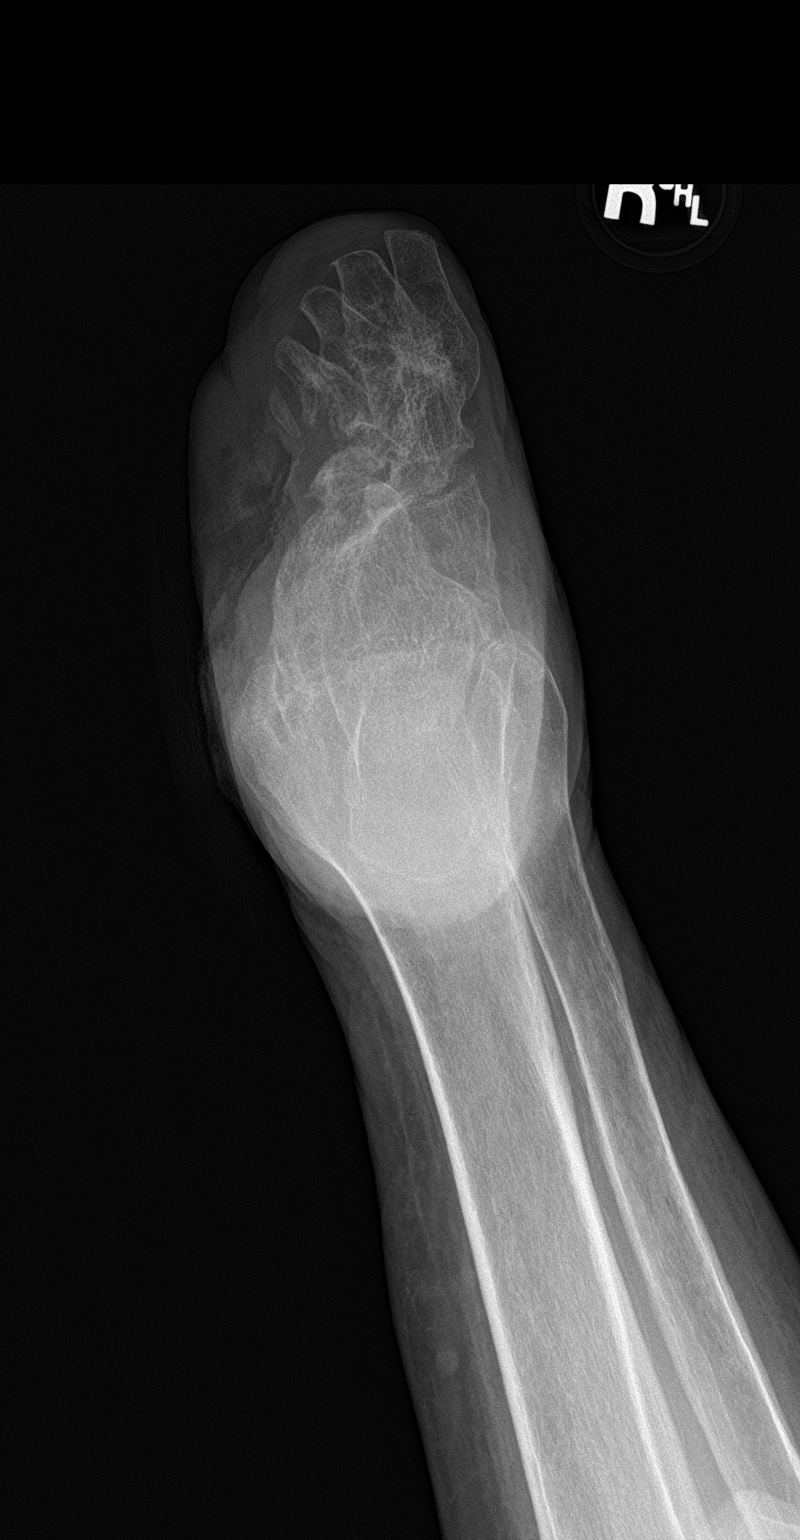

[foot obl]
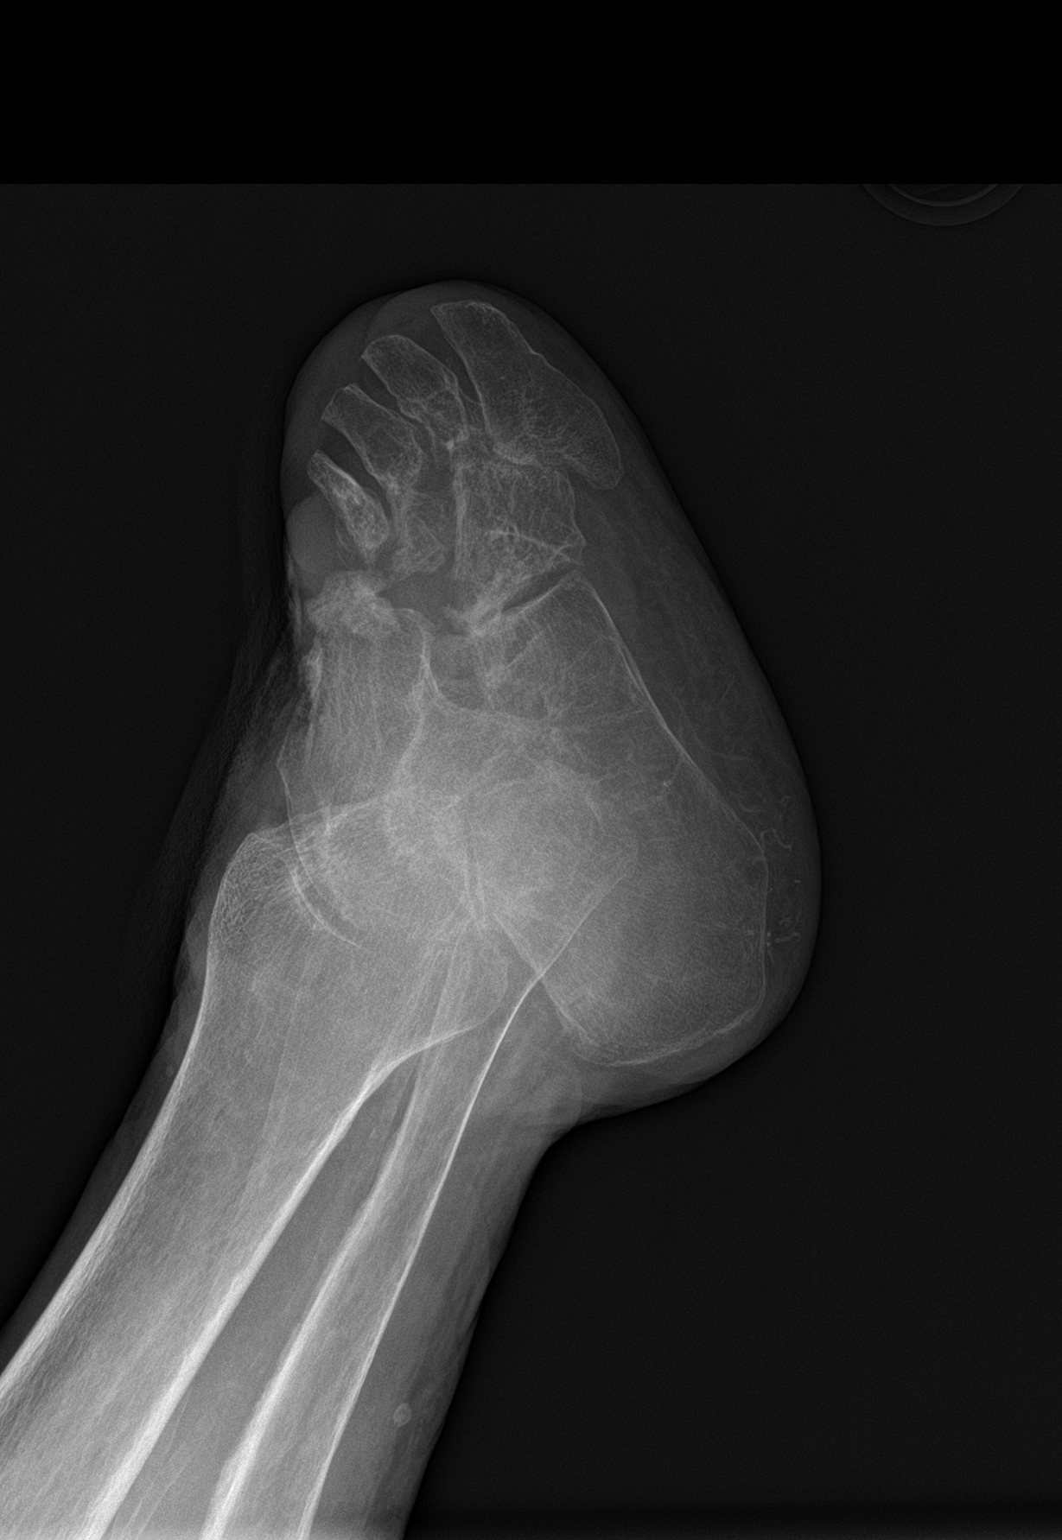

[foot lat]
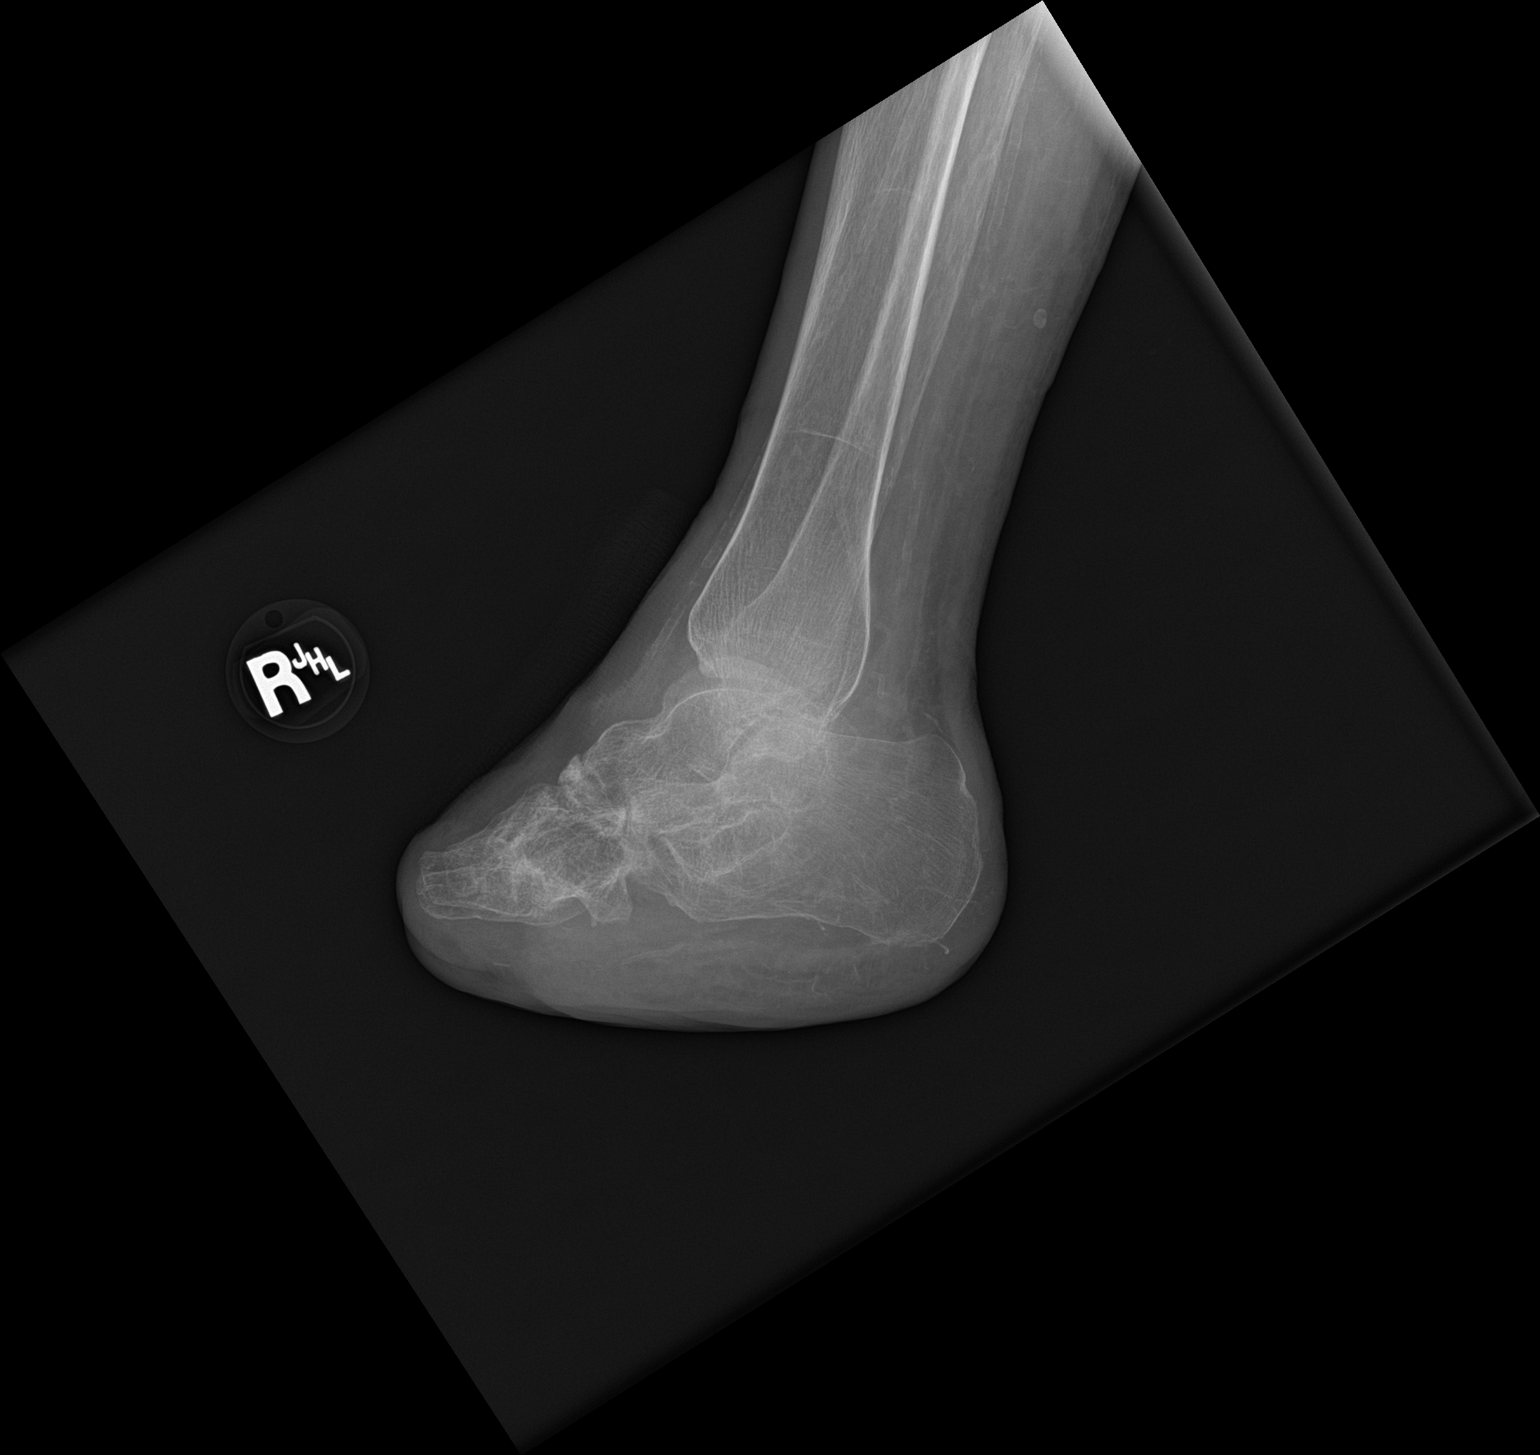

[3 of 3 positions shown; findings below may reference images not displayed]

FINDINGS: Prior transmetatarsal amputations. Bone destruction within the
carpal bones involving the navicular, cuboid, lateral and middle
cuneiforms appears essentially unchanged since prior study. Bone
destruction involving the base of the metatarsals again noted. These
findings likely reflect chronic osteomyelitis. Diffuse osteopenia.
IMPRESSION: Prior transmetatarsal amputations.

Bone destruction throughout much of the midfoot as above is similar
to prior study and likely reflects chronic osteomyelitis.

## 2021-02-12 IMAGING — US US EXTREM LOW VENOUS*L*
1 series · 13 of 24 positions shown · non-contrast
Comparison: 07/02/2011

CLINICAL DATA: Posterior left calf pain.



[Series 1: us extrem low venous*left* · 0.06mm/px · 13 of 44 slices shown]
[im 1/44]
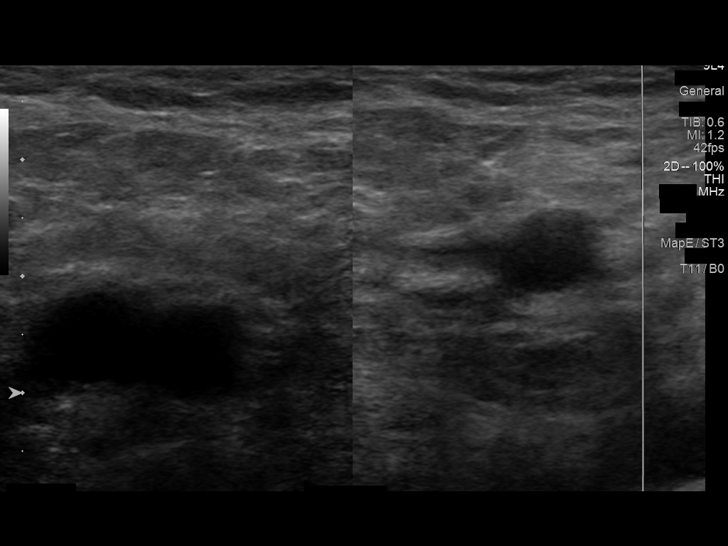
[im 4/44]
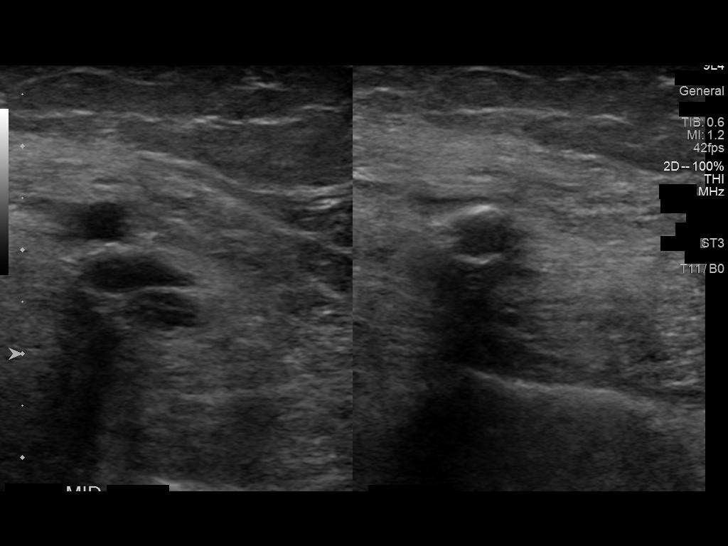
[im 8/44]
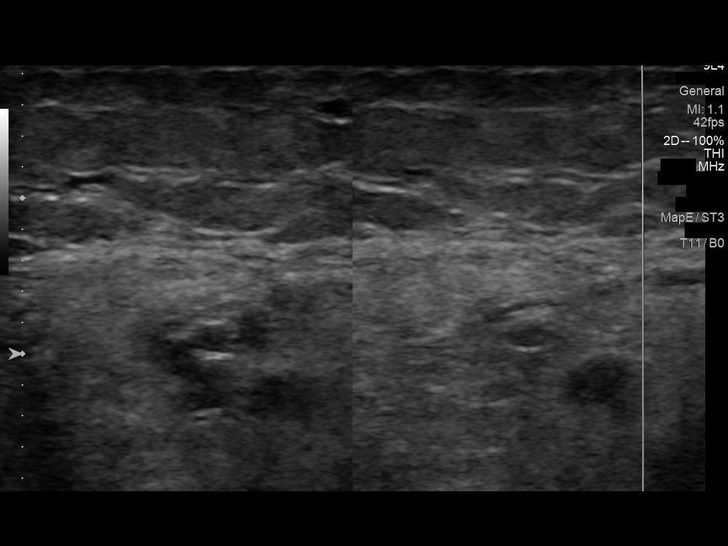
[im 12/44]
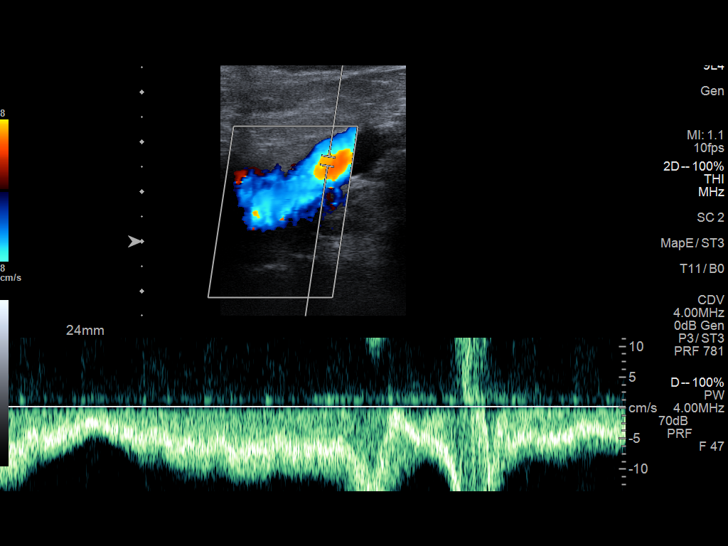
[im 15/44]
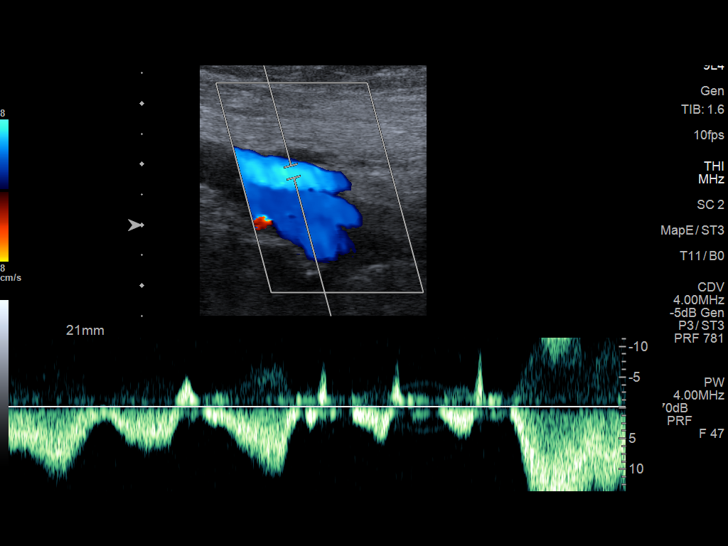
[im 19/44]
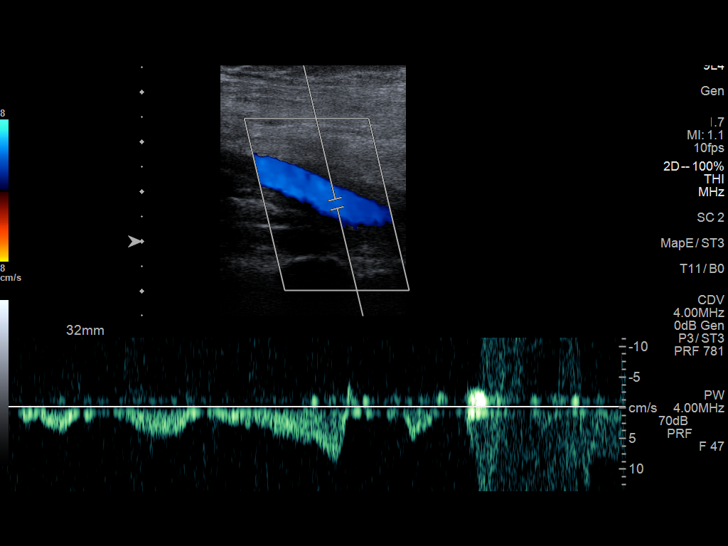
[im 23/44]
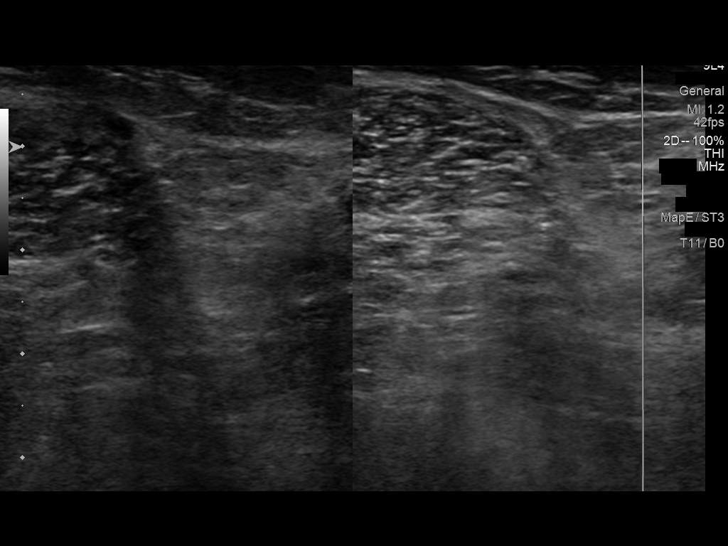
[im 25/44]
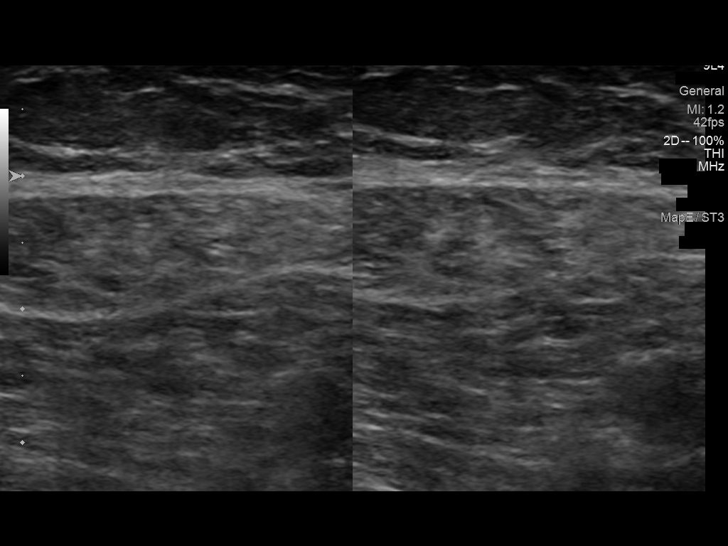
[im 29/44]
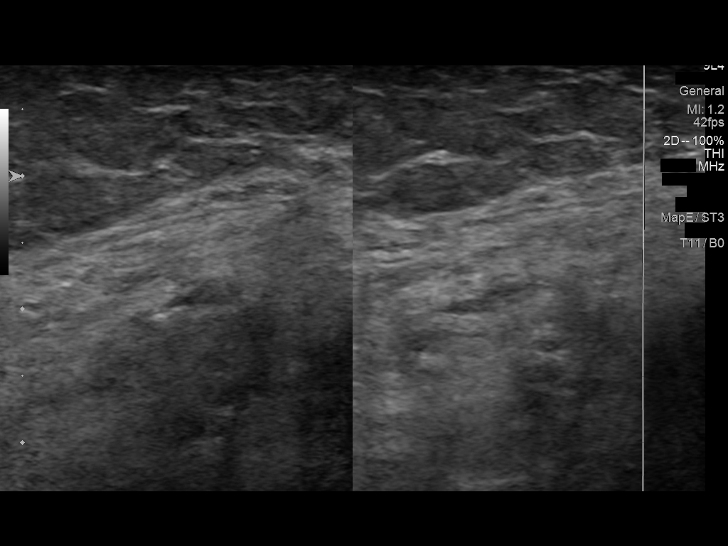
[im 32/44]
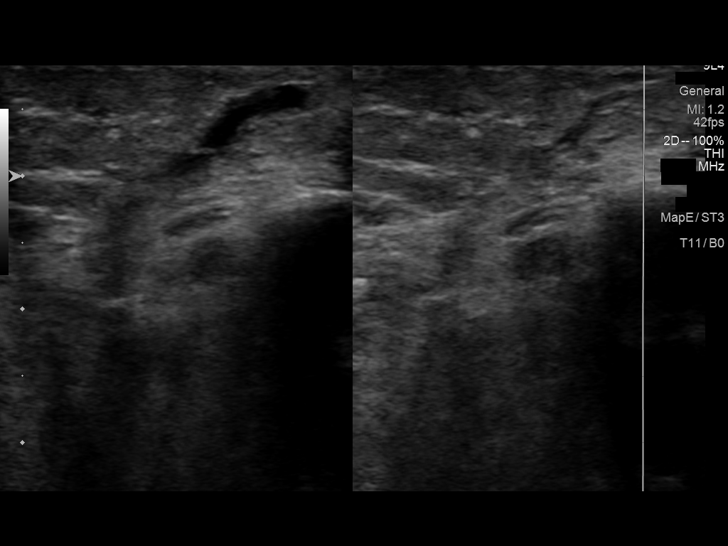
[im 36/44]
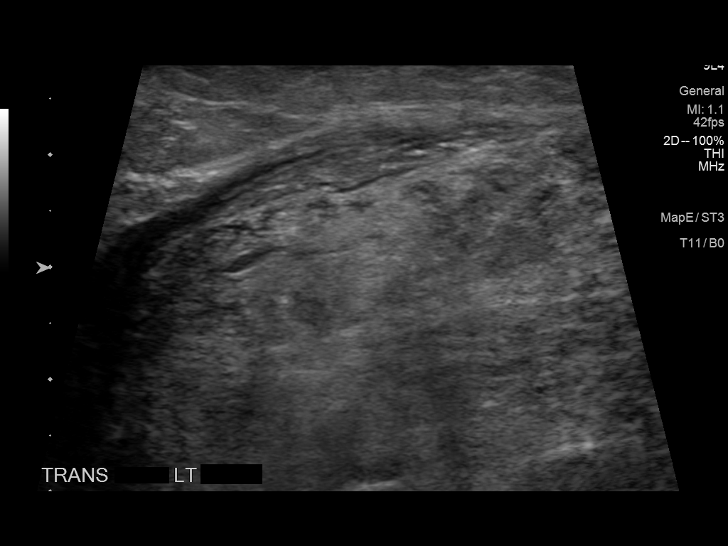
[im 40/44]
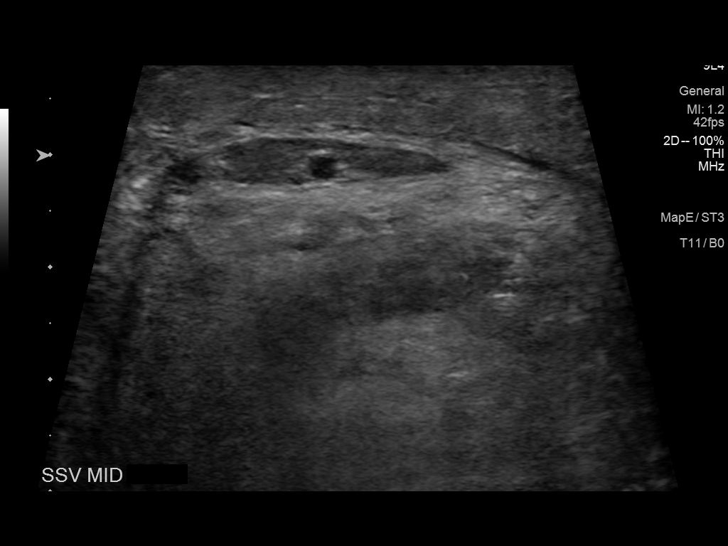
[im 44/44]
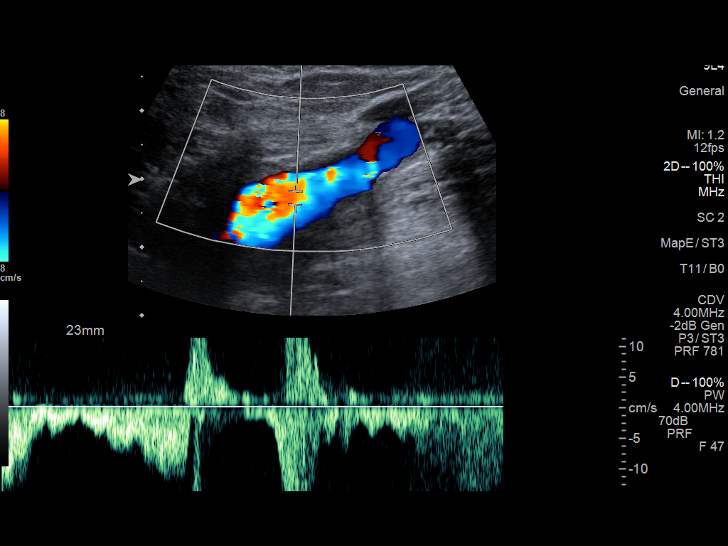

[13 of 24 positions shown; findings below may reference images not displayed]

FINDINGS: Contralateral Common Femoral Vein: Respiratory phasicity is normal
and symmetric with the symptomatic side. No evidence of thrombus.
Normal compressibility.

Common Femoral Vein: No evidence of thrombus. Normal
compressibility, respiratory phasicity and response to augmentation.

Saphenofemoral Junction: No evidence of thrombus. Normal
compressibility and flow on color Doppler imaging.

Profunda Femoral Vein: No evidence of thrombus. Normal
compressibility and flow on color Doppler imaging.

Femoral Vein: No evidence of thrombus. Normal compressibility,
respiratory phasicity and response to augmentation.

Popliteal Vein: No evidence of thrombus. Normal compressibility,
respiratory phasicity and response to augmentation.

Calf Veins: No evidence of thrombus. Normal compressibility and flow
on color Doppler imaging.

Superficial Great Saphenous Vein: No evidence of thrombus. Normal
compressibility.

Venous Reflux:  None.

Other Findings: No evidence of superficial thrombophlebitis or
abnormal fluid collection.
IMPRESSION: No evidence of left lower extremity deep venous thrombosis.

## 2021-02-14 ENCOUNTER — Encounter (HOSPITAL_BASED_OUTPATIENT_CLINIC_OR_DEPARTMENT_OTHER): Payer: Medicare HMO | Attending: Internal Medicine | Admitting: Internal Medicine

## 2021-02-14 ENCOUNTER — Other Ambulatory Visit: Payer: Self-pay

## 2021-02-14 DIAGNOSIS — L97519 Non-pressure chronic ulcer of other part of right foot with unspecified severity: Secondary | ICD-10-CM | POA: Insufficient documentation

## 2021-02-14 DIAGNOSIS — L02415 Cutaneous abscess of right lower limb: Secondary | ICD-10-CM | POA: Diagnosis not present

## 2021-02-14 DIAGNOSIS — Z992 Dependence on renal dialysis: Secondary | ICD-10-CM | POA: Insufficient documentation

## 2021-02-14 DIAGNOSIS — I120 Hypertensive chronic kidney disease with stage 5 chronic kidney disease or end stage renal disease: Secondary | ICD-10-CM | POA: Insufficient documentation

## 2021-02-14 DIAGNOSIS — E1042 Type 1 diabetes mellitus with diabetic polyneuropathy: Secondary | ICD-10-CM | POA: Diagnosis not present

## 2021-02-14 DIAGNOSIS — G40909 Epilepsy, unspecified, not intractable, without status epilepticus: Secondary | ICD-10-CM | POA: Diagnosis not present

## 2021-02-14 DIAGNOSIS — N186 End stage renal disease: Secondary | ICD-10-CM | POA: Diagnosis not present

## 2021-02-14 DIAGNOSIS — Z89512 Acquired absence of left leg below knee: Secondary | ICD-10-CM | POA: Diagnosis not present

## 2021-02-14 DIAGNOSIS — Z9641 Presence of insulin pump (external) (internal): Secondary | ICD-10-CM | POA: Diagnosis not present

## 2021-02-14 DIAGNOSIS — E1022 Type 1 diabetes mellitus with diabetic chronic kidney disease: Secondary | ICD-10-CM | POA: Diagnosis not present

## 2021-02-14 DIAGNOSIS — E10621 Type 1 diabetes mellitus with foot ulcer: Secondary | ICD-10-CM | POA: Diagnosis present

## 2021-02-15 ENCOUNTER — Encounter: Payer: Medicare HMO | Attending: Endocrinology | Admitting: Nutrition

## 2021-02-15 DIAGNOSIS — E1021 Type 1 diabetes mellitus with diabetic nephropathy: Secondary | ICD-10-CM | POA: Insufficient documentation

## 2021-02-15 NOTE — Patient Instructions (Signed)
Take Lymjev insulin 5 minutes after eating Change cartridge every 3-4 days.  Fill with 170u Do only once correction dose every 3 hours.

## 2021-02-15 NOTE — Progress Notes (Signed)
Discussed with patient ways she can get blood sugars down, and less variability in blood sugar readings.    Take insulin before meals;  Pt refuses this saying she sometimes can not eat everything on her plate, and some times she throws up after eating.  So she is afraid to take the insulin.   She sometimes takes several doses of insulin when blood sugars are high, because "it is not coming down like it should".   She has put the pump back in sleep mode, because she "has too many lows at 4 AM when out of sleep mode Change cartridges every 3 days, despite insulin still in cartridge.  Fill cartridge with only 170u.  Pt. Appears to have an excuse for not doing any of the suggestions given.  Suggested taking Lymjev insuin 5 min. After the meal.  This is not covered by her insurance, so she was given a vial to try, per Dr. Ronnie Derby suggestion, and if she feels she is getting better control with this insulin, she will let us know and we can get a prior auth. For this.  She agreed to call me in 2 weeks to let me know if this is helping her to have less variablity in blood sugar readings.

## 2021-02-16 NOTE — Progress Notes (Signed)
NITA, [name] ([id number]) Visit Report for 02/14/2021 Debridement Details Patient Name: Date of Service: Nancy LLO [name] 02/14/2021 1:00 PM Medical Record Number: [id number] Patient Account Number: [id number] Date of Birth/Sex: Treating RN: [birth year] (50 y.o. [name], [name] Primary Care Provider: Sanjuana [name], [name] LL Other Clinician: Referring Provider: Treating Provider/Extender: [name], [name] LL Weeks in Treatment: 127 Debridement Performed for Assessment: Wound #52 Right Foot Performed By: Physician [name]., MD Debridement Type: Debridement Severity of Tissue Pre Debridement: Fat layer exposed Level of Consciousness (Pre-procedure): Awake and Alert Pre-procedure Verification/Time Out Yes - 11:31 Taken: Start Time: 13:32 Pain Control: Other : Benzocaine T Area Debrided (L x W): otal 1 (cm) x 3 (cm) = 3 (cm) Tissue and other material debrided: Non-Viable, Slough, Subcutaneous, Slough Level: Skin/Subcutaneous Tissue Debridement Description: Excisional Instrument: Curette Bleeding: Moderate Hemostasis Achieved: Silver Nitrate End Time: 13:38 Response to Treatment: Procedure was tolerated well Level of Consciousness (Post- Awake and Alert procedure): Post Debridement Measurements of Total Wound Length: (cm) 1 Width: (cm) 3 Depth: (cm) 0.1 Volume: (cm) 0.236 Character of Wound/Ulcer Post Debridement: Stable Severity of Tissue Post Debridement: Fat layer exposed Post Procedure Diagnosis Same as Pre-procedure Electronic Signature(s) Signed: 02/14/2021 4:37:23 PM By: Nancy Ham MD Signed: 02/16/2021 5:03:29 PM By: Nancy Hammock RN Entered By: [name] on 02/14/2021 13:47:07 -------------------------------------------------------------------------------- HPI Details Patient Name: Date of Service: Nancy LLO [name] 02/14/2021 1:00 PM Medical Record Number: [id number] Patient Account Number:  [id number] Date of Birth/Sex: Treating RN: 1971/01/26 (50 y.o. [name], [name] Primary Care Provider: Sanjuana [name], [name] LL Other Clinician: Referring Provider: Treating Provider/Extender: [name], [name] LL Weeks in Treatment: 63 History of Present Illness HPI Description: When56 year old diabetic who is known to have type 1 diabetes which is poorly controlled last hemoglobin A1c was 11%. She comes in with a ulcerated area on the left lateral foot which has been there for over 6 months. Was recently she has been treated by Dr. Amalia [name] of podiatry who saw her last on 05/28/2016. Review of his notes revealed that the patient had incision and drainage with placement of antibiotic beads to the left foot on 04/11/2016 for possible osteomyelitis of the cuboid bone. Over the last year she's had a history of amputation of the left fifth toe and a femoropopliteal popliteal bypass graft somewhere in April 2017. 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 His note Dr. Amalia [name] mentions that the patient has been referred to me for further wound care and possibly great candidate for hyperbaric oxygen therapy due to recurrent osteomyelitis. However we do not have any x-rays of biopsy reports confirming this. He has been on several antibiotics including Bactrim and most recently is on doxycycline for an MRSA. I understand, the patient was not a candidate for IV antibiotics as she has had previous PICC lines which resulted in blood clots in both arms. There was a x-ray report dated 04/04/2016 on Dr. Amalia [name] notes which showed evidence of fifth ray resection left foot with osteolytic changes noted to the fourth metatarsal and cuboid bone on the left. 06/13/2016 -- had a left foot x-ray which showed no acute fracture or dislocation and no definite radiographic evidence of osteomyelitis. Advanced osteopenia was seen. 06/20/2016 -- she has noticed a new wound on the right plantar foot in the  region where she had a callus before. 06/27/16- the patient did have her x-ray of the right foot which showed  no findings to suggest osteomyelitis. She saw her endocrinologist, [name], yesterday. Her A1c in January was 11. He also indicates mismanagement and noncompliance regarding her diabetes. She is currently on Bactrim for a lip infection. She is complaining of nausea, vomiting and diarrhea. She is unable to articulate the exact orders or dosing of the Bactrim; it is unclear when she will complete this. 07/04/2016 -- results from [facility] of ABIs with ankle waveforms were noted from 02/14/2016. The examination done on 06/27/2015 showed noncompressible ABIs with the right being 1.45 and the left being 1.33. The present examination showed a right ABI of 1.19 on the left of 1.33. The conclusion was that right normal ABI in the lower extremity at rest however compared to previous study which was noncompressible ABI may be falsely elevated side suggesting medial calcification. The left ABI suggested medial calcification. 08/01/2016 -- the patient had more redness and pain on her right foot and did not get to come to see as noted she see her PCP or go to the ER and decided to take some leftover metronidazole which she had at home. As usual, the patient does report she feels and is rather noncompliant. 08/08/2016 -- -- x-ray of the right foot -- FINDINGS:Transmetatarsal amputation is noted. No bony destruction is noted to suggest osteomyelitis. IMPRESSION: No evidence of osteomyelitis. Postsurgical changes are seen. MRI would be more sensitive for possible bony changes. Culture has grown Serratia Marcescens -- sensitive to Bactrim, ciprofloxacin, ceftazidime she was seen by Dr. Daylene [name] on 08/06/2016. He did not find any exposed bone, muscle, tendon, ligament or joint. There was no malodor and he did a excisional debridement in the office. ============ Old notes: 50 year old patient who is  known to the wound clinic for a while had been away from the wound clinic since 09/01/2014. Over the last several months she has been admitted to various hospitals including [facility] [place]. She was treated for a right metatarsal osteomyelitis with a transmetatarsal amputation and this was done about 2 months ago. He has a small ulcerated area on the right heel and she continues to have an ulcerated area on the left plantar aspect of the foot. The patient was recently admitted to the [facility] hospital group between 7/12 and 10/18/2014. she was given 3 weeks of IV vancomycin and was to follow-up with her surgeons at [facility] and also took oral vancomycin for C. difficile colitis. 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 MRI of the left foot done on 10/14/2014 showed no abscess or osteomyelitis. 04/27/15; this is a patient we know from previous stays in the wound care center. She is a type I diabetic I am not sure of her control currently. Since the last time I saw her she is had a right transmetatarsal amputation and has no wounds on her right foot and has no open wounds. She is been followed at the wound care center at [facility] in [place]. She comes today with the desire to undergo hyperbaric treatment locally. Apparently one of her wound care providers in [name] has suggested hyperbarics. This is in response to an MRI from 04/18/15 that showed increased marrow signal and loss of the proximal fifth metatarsal cortex evidence of osteomyelitis with likely early osteomyelitis in the cuboid bone as well.  She has a large wound over the base of the fifth metatarsal. She also has a eschar over her the tips of  her toes on 1,3 and 5. She does not have peripheral pulses and apparently is going for an angiogram tomorrow which seems reasonable. After this she is going to infectious disease at [facility]. They have been using Medihoney to the large wound on the lateral aspect of the left foot to. The patient has known Charcot deformity from diabetic neuropathy. She also has known diabetic PAD. Surprisingly I can't see that she has had any recent antibiotics, the patient states the last antibiotic she had was at the end of November for 10 days. I think this was in response to culture that showed group G strep although I'm not exactly sure where the culture was from. She is also had arterial studies on 03/29/15. This showed a right ABI of 1.4 that was noncompressible. Her left ABI was 0.73. 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 It was not felt that arterial inflow was adequate for healing of a foot ulcer. Her Doppler waveforms looked monophasic ===== READMISSION 02/28/17; this is in an now 50 year old woman we've had at several different occasions in this clinic. 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 She has a remote ex-smoker. She was last seen in this clinic by Dr. Con Memos I think in May. More recently she is been followed by her podiatrist Dr. Amalia [name] an infectious disease Dr. Megan Salon. She has 2 open wounds the major one is over the right first metatarsal head she also has a wound on the left plantar foot. an MRI of the right foot on 01/01/17 showed a soft tissue ulcer along the plantar aspect of the first metatarsal base consistent with osteomyelitis of the first metatarsal stump. Dr. 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 According to the patient this is been open for slightly over a month. She has been on a combination of Cipro 500 twice a day, Zyvox 600 twice a  day and Flagyl 500 3 times a day for over a month now as directed by Dr. Megan Salon. cultures of the right foot earlier this year showed MRSA in January and Serratia in May. January also had a few viridans strep. Recent x-rays of both feet were done and Dr. Amalia [name] office and I don't have these reports. The patient has known PAD and has a history of aleft femoropopliteal bypass in April 2017. 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 Dwyane Dee. In spite of this the last hemoglobin A1c I can see is 10.1 on 01/01/2017. She is being referred by Dr. 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 She is been using Medihoney to this area and also an area on the left midfoot. She is using healing sandals bilaterally. ABIs in this clinic at the left posterior tibial was 1.1 noncompressible on the right READMISSION Non invasive vascular NOVANT 5/18 Aftercare following surgery of the circulatory system Procedure Note - Interface, External Ris In - 08/13/2016 11:05 AM EDT Procedure: Examination consists of physiologic resting arterial pressures of the brachial and ankle arteries bilaterally with continuous wave Doppler waveform analysis. Previous: Previous exam  performed on 02/14/16 demonstrated ABIs of Rt = 1.19 and Lt = 1.33. Right: ABI = non-compressible PT 1.47 DP. S/P transmet amputation. , Left: ABI = 1.52, 2nd digit pressure = 87 mmHg Conclusions: Right: ABI (>1.3) may be falsely elevated, suggesting medial calcification. Left: ABI (>1.3) may be falsely elevated, suggesting medial calcification The patient is a now 50 year old type I diabetic is had multiple issues her graded to chronic diabetic foot ulcers. She has had a previous right transmetatarsal amputation fifth  ray amputation. She had Charcot feet diabetic polyneuropathy. We had her in the clinic lastin November. At that point she had wounds on her bilateral feet.she had wanted to try hyperbarics however the healogics review process denied her because she hadn't followed up with her vascular surgeon for her left femoropopliteal bypass. The bypass was done by Dr. Raul Del at [facility]. We made her a follow-up with Dr. Raul Del however she did not keep the appointment and therefore she was not approved The patient shows me a small wound on her left fourth metatarsal head on her phone. She developed rapid discoloration in the plantar aspect of the left foot and she was admitted to hospital from 2/2 through 05/10/17 with wet gangrene of the left foot osteomyelitis of the fourth metatarsal heads. She was admitted acutely ill with a temperature of 103. She was started on broad-spectrum vancomycin and cefepime. On 05/06/17 she was taken to the OR by Dr. Amalia [name] her podiatric surgeon for an incision and drainage irrigation of the left foot wound. Cultures from this surgery revealed group be strep and anaerobes. she was seen by Dr.Xu of orthopedic surgery and scheduled for a below-knee amputation which she u refused. Ultimately she was discharged on Levaquin and Flagyl for one month. MRI 05/05/17 done while she was in the hospital showed abscess adjacent to the fourth metatarsal head and neck small abscess around the fourth flexor tendon. Inflammatory phlegmon and gas in the soft tissues along the lateral aspect of the fourth phalanx. Findings worrisome for osteomyelitis involving the fourth proximal and middle phalanx and also the third and fourth metatarsals. Finally the patient had actually shortly before this followed up with Dr. Raul Del at no time on 04/29/17. He felt that her left femoropopliteal bypass was patent he felt that her left-sided toe pressures more than adequate for healing a wound on the left foot. This  was before her acute presentation. 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 05/28/17; she is started hyperbarics. The patient tells me that for some reason she was not actually on Levaquin but I think on ciprofloxacin. She was on Flagyl. She only started her Levaquin yesterday due to some difficulty with the pharmacy and perhaps her sister picking it up. She has an appointment with Dr. Amalia [name] tomorrow and with infectious disease early next week. She has no new complaints 06/06/17; the patient continues in hyperbarics. She saw Dr. Amalia [name] on 05/29/17 who is her podiatric surgeon. He is elected for a transmetatarsal amputation on 06/27/17. I'm not sure at what level he plans to do this amputation. The patient is unaware She also saw Dr. Megan Salon of infectious disease who elected to continue her on current antibiotics I think this is ciprofloxacin and Flagyl. I'll need to clarify with her tomorrow if she actually has this. We're using silver alginate to the actual wound. Necrotic surface today with material under the flap of her foot. Original MRI showed abscesses as well as osteomyelitis of the proximal and middle fourth phalanx and the third  and fourth metatarsal heads 06/11/17; patient continues in hyperbarics and continues on oral antibiotics. She is doing well. The wound looks better. The necrotic part of this under the flap in her superior foot also looks better. she is been to see Dr. Amalia [name]. I haven't had a chance to look at his note. Apparently he has put the transmetatarsal amputation on hold her request it is still planning to take her to the OR for debridement and product application ACEL. I'll see if I can find his note. I'll therefore leave product ordering/requests to Dr. Amalia [name] for now. I was going to look at Dermagraft 06/18/17-she is here in follow-up evaluation for bilateral foot wounds. She continues with hyperbaric therapy. She states she has been applying manuka honey to the right plantar  foot and alternate manuka honey and silver alginate to the left foot, despite our orders. We will continue with same treatment plan and she will follo up next week. 06/25/17; I have reviewed Dr. Amalia [name] last note from 3/11. She has operative debridement in 2 days' time. By review his note apparently they're going to place there is skin over the majority of this wound which is a good choice. She has a small satellite area at the most proximal part of this wound on the left plantar foot. The area on the right plantar foot we've been using silver alginate and it is close to healing. 07/02/17; unfortunately the patient was not easily approved for Dr. Amalia [name] proposed surgery. I'm not completely certain what the issue is. She has been using silver alginate to the wound she has completed a first course of hyperbarics. She is still on Levaquin and Flagyl. I have really lost track of the time course here.I suspect she should have another week to 2 of antibiotics. I'll need to see if she is followed up with infectious disease Dr. Megan Salon 07/09/17; the patient is followed up with Dr. Megan Salon. 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 She continues on Levaquin and metronidazole continuing both of these for now I think she is been on fr about 6 weeks. She still has some drainage but no pain. No fever. Her had been plans for her to go to the OR for operative debridement with her podiatrist Dr. Amalia [name], I am not exactly sure where that is. I'll probably slip a note to Dr. Amalia [name] today. I note that she follows with Dr. Dwyane Dee of endocrinology. We have her recertified for hyperbaric oxygen. I have not heard about Dermagraft however I'll see if Dr. Amalia [name] is planning a skin substitute as well 07/16/17; the patient tells me she is just about out of [place]. I'll need to check Dr. Hale Bogus last notes on this. She states she has plenty of Flagyl however. She comes in today complaining of  pain in the right lateral foot which she said lasted for about a day. The wound on the right foot is actually much more medially. She also tells me that the [facility] cost a lot of pain in the left foot wound and she turned back to silver alginate. Finally Dermagraft has a $850 per application co-pay. She cannot afford this 07/23/17; patient arrives today with the wound not much smaller. There is not much new to add. She has not heard from Dr. Amalia [name] all try to put in a call to them today. She was asking about Dermagraft again and she has an over $277 per application co-pay she states that  she would be willing to try to do a payment plan. I been tried to avoid this. We've been using silver alginate, I'll change to [facility] 07/30/17-She is here in follow-up evaluation for left foot ulcer. She continues hyperbaric medicine. The left foot ulcer is stable we will continue with same treatment plan 08/06/17; she is here for evaluation of her left foot ulcer. Currently being treated for hyperbarics or underlying osteomyelitis. She is completed antibiotics. The left foot ulcer is better smaller with healthier looking granulation. For various reasons I am not really clear on we never got her back to the OR with Dr. Amalia [name]. He did not respond to my secure text message. Nevertheless I think that surgery on this point is not necessary nor am I completely clear that a skin substitute is necessary The patient is complaining about pain on the outside of her right foot. 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 There is no erythema. She also states the foot is warm versus her other part of her upper leg and this is largely true. It is not totally clear to me what's causing this. She thinks it's different from her usual neuropathy pain 08/13/17; she arrives in clinic today with a small wound which is superficial on her right first metatarsal head. 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 She tells [place] she was up on her feet over the Mother's Day celebration. The large wound is on the left foot. Continues with hyperbarics for underlying osteomyelitis. We're using Hydrofera Blue. She asked me today about where we were with Dermagraft. I had actually excluded this because of the co-pay however she wants to assume this therefore I'll recheck the co-pay an order for next week. 08/20/17; the patient agreed to accept the co-pay of the first Apligraf which we applied today. She is disappointed she is finishing hyperbarics will run this through the insurance on the extent of the foot infection and the extent of the wound that she had however she is already had 60 dive's. Dermagraft No. 1 08/27/17; Dermagraft No. 2. She is not eligible for any more hyperbaric treatments this month. She reports a fair amount of drainage and she actually changed to the external dressings without disturbing the direct contact layer 09/03/17; the patient arrived in clinic today with the wound superficially looking quite healthy. Nice vibrant red tissue with some advancing epithelialization although not as much adherence of the flap as I might like. However she noted on her own fourth toe some bogginess and she brought that to our attention. Indeed this was boggy feeling like a possibility of subcutaneous fluid. 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 She is not been unwell. We've been using Dermagraft 09/10/17; the culture that I did not last week was MRSA. She saw Dr. Megan Salon this morning who is going to start her on vancomycin. I had sent him a secure a text message yesterday. I also spoke with her podiatric surgeon Dr. Amalia [name] about surgery on this foot the options for conserving a functional foot etc. Promised me he would see her and will make back consultation today. Paradoxically her actual wound on the plantar aspect of her left  foot looks really quite good. I had given her 5 days worth of Baxdella to cover her for MRSA. Her MRI came back showing osteomyelitis within the third metatarsal shaft and head and base of the  third and fourth proximal phalanx. She had extensive inflammatory changes throughout the soft tissue of the lateral forefoot. With an ill-defined fluid around the fourth metatarsal extending into the plantar and dorsal soft tissues 09/19/17; the patient is actually on oral Septra and Flagyl. She apparently refused IV vancomycin. She also saw Dr. Amalia [name] at my request who is planning her for a left BKA sometime in mid July. MRI showed osteomyelitis within the third metatarsal shaft and head and the basis of the third and fourth proximal phalanx. I believe there was felt to be possible septic arthritis involving the third MTP. 09/26/17; the patient went back to Dr. Megan Salon at my suggestion and is now receiving IV daptomycin. Her wound continues to look quite good making the decision to proceed with a transmetatarsal amputation although more difficult for the patient. I believe in my extensive discussions with her she has a good sense of the pros and cons of this. I don't NV the tuft decision she has to make. She has an appointment with Dr. Amalia [name] I believe in mid July and I previously spoken to him about this issue Has we had used 3 previous Dermagraft. Given the condition of the wound surface I went ahead and added the fourth one today area and I did this not fully realizing that she'll be traveling to [place] next week. I'm hopeful she can come back in 2 weeks 10/21/17; Her same Dermagraft on for about 3-1/2 weeks. In spite of this the wound arrives looking quite healthy. There is been a lot of healing dimensions are smaller. Looking at the square shaped wound she has now there is some undermining and some depth medially under the undermining although I cannot palpate any bone. No surrounding infection is  obvious. She has difficult questions about how to look at this going forward vis--vis amputations versus continued medical therapy. T be truthful the wound is looks o so healthy and it is continued to contract. Hard to justify foot surgery at this point although I still told her that I think it might come to that if we are not able to eradicate the underlying MRSA. She is still highly at risk and she understands this 11/06/17 on evaluation today patient appears to be doing better in regard to her foot ulcer. She's been tolerating the dressing changes without complication. Currently she is here for her Dermagraft #6. Her wound continues to make excellent progress at this point. She does not appear to have any evidence of infection which is good news. 11/13/17 on evaluation today patient appears to be doing excellent at this time. She is here for repeat Dermagraft application. This is #7. Overall her wound seems to be making great progress. 12/05/17; the patient arrives with the wound in much better condition than when I last saw this almost 6 weeks ago. She still has a small probing area in the left metatarsal head region on the lateral aspect of her foot. We applied her last Dermagraft today. Since the last time she is here she has what appears to a been a blood blister on the plantar aspect of left foot although I don't see this is threatening. There is also a thick raised tissue on the right mid metatarsal head region. This was not there I don't think the last time she was here 3 weeks ago. 12/12/17; the patient continues to have a small programming area in the left metatarsal head region on the lateral aspect of her foot which was the initial  large surgical wound. I applied her last Apligraf last week. I'm going to use Endoform starting today Unfortunately she has an excoriated area in the left mid foot and the right mid foot. The left midfoot looks like a blistered area this was not opened last  week it certainly is open today. Using silver alginate on these areas. She promises me she is offloading this. 12/19/17; the small probing area in the left metatarsal head eyes think is shallower. In general her original wound looks better. We've been using Endoform. The area inferiorly that I think was trauma last week still requires debridement a lot of nonviable surface which I removed. She still has an open open area distally in her foot Similarly on the right foot there is tightly adherent surface debris which I removed. Still areas that don't look completely epithelialized. This is a small open area. We used silver alginate on these areas 12/26/2017; the patient did not have the supplies we ordered from last week including the Endoform. The original large wound on the left lateral foot looks healthy. She still has the undermining area that is largely unchanged from last week. She has the same heavily callused raised edged wounds on the right mid and left midfoot. Both of these requiring debridement. We have been using silver alginate on these areas 01/02/2018; there is still supply issues. We are going to try to use Prisma but I am not sure she actually got it from what she is saying. She has a new open area on the lateral aspect of the left fourth toe [previous fifth ray amputation]. Still the one tunneling area over the fourth metatarsal head. The area is in the midfoot bilaterally still have thick callus around them. She is concerned about a raised swelling on the lateral aspect of the foot. However she is completely insensate 01/10/2018; we are using Prisma to the wounds on her bilateral feet. Surprisingly the tunneling area over the left fourth metatarsal head that was part of her original surgery has closed down. She has a small open area remaining on the incision line. 2 open areas in the midfoot. 02/10/2018; the patient arrives back in clinic after a month hiatus. She was traveling to  visit family in [place]. Is fairly clear she was not offloading the areas on her feet. The original wound over the left lateral foot at the level of metatarsal heads is reopened and probes medially by about a centimeter or 2. She notes that a week ago she had purulent drainage come out of an area on the left midfoot. Paradoxically the worst area is actually on the right foot is extensive with purulent drainage. We will use silver alginate today 02/17/2018; the patient has 3 wounds one over the left lateral foot. She still has a small area over the metatarsal heads which is the remnant of her original surgical wound. This has medial probing depth of roughly 1.4 cm somewhat better than last week. The area on the right foot is larger. We have been using silver alginate to all areas. The area on the right foot and left foot that we cultured last week showed both Klebsiella and Proteus. Both of these are quinolone sensitive. The patient put her's self on Bactrim and Flagyl that she had left hanging around from prior antibiotic usages. She was apparently on this last week when she arrived. I did not realize this. Unfortunately the Bactrim will not cover either 1 of these organisms. We will send in Cipro 500  twice daily for a week 03/04/2018; the patient has 2 wounds on the left foot one is the original wound which was a surgical wound for a deep DFU. At one point this had exposed bone. She still has an area over the fourth metatarsal head that probes about 1.4 cm although I think this is better than last week. I been using silver nitrate to try and promote tissue adherence and been using silver alginate here. She also has an area in the left midfoot. This has some depth but a small linear wound. Still requiring debridement. On the right midfoot is a circular wound. A lot of thick callus around this area. We have been using silver alginate to all wound areas She is completed the ciprofloxacin I gave her 2  weeks ago. 03/11/2018; the patient continues to have 2 open areas on the left foot 1 of which was the original surgical wound for a deep DFU. Only a small probing area remains although this is not much different from last week we have been using silver alginate. The other area is on the midfoot this is smaller linear but still with some depth. We have been using silver alginate here as well On the right foot she has a small circular wound in the mid aspect. This is not much smaller than last time. We have been using silver alginate here as well 03/18/2018; she has 3 wounds on the left foot the original surgical wound, a very superficial wound in the mid aspect and then finally the area in the mid plantar foot. She arrives in today with a very concerning area in the wound in the mid plantar foot which is her most proximal wound. There is undermining here of roughly 1-1/2 cm superiorly. Serosanguineous drainage. She tells me she had some pain on for over the weekend that shot up her foot into her thigh and she tells me that she had a nodule in the groin area. She has the single wound in the right foot. We are using endoform to both wound areas 03/24/2018; the patient arrives with the original surgical wound in the area on the left midfoot about the same as last week. There is a collection of fluid under the surface of the skin extending from the surgical wound towards the midfoot although it does not reach the midfoot wound. The area on the right foot is about the same. Cultures from last week of the left midfoot wound showed abundant Klebsiella abundant Enterococcus faecalis and moderate methicillin resistant staph I gave her Levaquin but this would have only covered the Klebsiella. She will need linezolid 04/01/2018; she is taking linezolid but for the first few days only took 1 a day. I have advised her to finish this at twice daily dosing. In any case all of her wounds are a lot better especially  on the left foot. The original surgical wound is closed. The area on the left midfoot considerably smaller. The area on the right foot also smaller. 04/08/2018; her original surgical wound/osteomyelitis on the left foot remains closed. She has area on the left foot that is in the midfoot area but she had some streaking towards this. This is not connected with her original wound at least not visually. Small wound on the right midfoot appears somewhat smaller. 04/15/18; both wounds looks better. Original wound is better left midfoot. Using silver alginate 1/21; patient states she uses saltwater soak in, stones or remove callus from around her wounds. She is also  concerned about a blood blister she had on the left foot but it simply resolved on its own. We've been using silver alginate 1/28; the patient arrives today with the same streaking area from her metatarsals laterally [the site of her original surgical wound] down to the middle of her foot. There is some drainage in the subcutaneous area here. This concerns me that there is actually continued ongoing infection in the metatarsals probably the fourth and third. This fixates an MRI of the foot without contrast [chronic renal failure] The wound in the mid part of the foot is small but I wonder whether this area actually connects with the more distal foot. The area on the right midfoot is probably about the same. Callus thick skin around the small wound which I removed with a curette we have been using silver alginate on both wound areas 2/4; culture I did of the draining site on the left foot last time grew methicillin sensitive staph aureus. MRI of the left foot showed interval resolution of the findings surrounding the third metatarsal joint on the prior study consistent with treated osteomyelitis. Chronic soft tissue ulceration in the plantar and lateral aspect of the forefoot without residual focal fluid collection. No evidence of recurrent  osteomyelitis. 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 She tells me she had nausea and vomiting over the weekend and missed 2 days. In general her foot looks much the same. She has a small open area just below the left fourth metatarsal head. A linear area in the left midfoot. Some discoloration extending from the inferior part of this into the left lateral foot although this appears to be superficial. She has a small area on the right midfoot which generally looks smaller after debridement 2/18; the patient is completing his cephalexin and has another 2 days. She continues to have open areas on the left and right foot. 2/25; she is now off antibiotics. The area on the left foot at the site of her original surgical wound has closed yet again. She still has open areas in the mid part of her foot however these appear smaller. The area on the right mid foot looks about the same. We have been using silver alginate She tells me she had a serious hypoglycemic spell at home. She had to have EMS called and get IV dextrose 3/3; disappointing on the left lateral foot large area of necrotic tissue surrounding the linear area. This appears to track up towards the same original surgical wound. Required extensive debridement. The area on the right plantar foot is not a lot better also using silver 3/12; the culture I did last time showed abundant enterococcus. I have prescribed Augmentin, should cover any unrecognized anaerobes as well. In addition there were a few MRSA and Serratia that would not be well covered although I did not want to give her multiple antibiotics. She comes in today with a new wound in the right midfoot this is not connected with the original wound  over her MTP a lot of thick callus tissue around both wounds but once again she said she is not walking on these areas 3/17-Patient comes in for follow-up on the bilateral plantar wounds, the right midfoot and the left plantar wound. Both these  are heavily callused surrounding the wounds. We are continuing to use silver alginate, she is compliant with offloading and states she uses a wheelchair fairly often at home 3/24; both wound areas have thick callus. However things actually look quite a bit better here for the majority of her left foot and the right foot. 3/31; patient continues to have thick callused somewhat irritated looking tissue around the wounds which individually are fairly superficial. There is no evidence of surrounding infection. We have been using silver alginate however I change that to [facility] today 4/17; patient returns to clinic after having a scare with Covid she tested negative in her primary doctor's office. She has been using Hydrofera Blue. She does not have an open area on the right foot. On the left foot she has a small open area with the mid area not completely viable. She showed me pictures of what looks like a hemorrhagic blister from several days ago but that seems to have healed over this was on the lateral left foot 4/21; patient comes in to clinic with both her wounds on her feet closed. However over the weekend she started having pain in her right foot and leg up into the thigh. She felt as though she was running a low-grade fever but did not take her temperature. She took a doxycycline that she had leftover and yesterday a single Septra and metronidazole. She thinks things feel somewhat better. 4/28; duplex ultrasound I ordered last week was negative for DVT or superficial thrombophlebitis. She is completed the doxycycline I gave her. States she is still having a lot of pain in the right calf and right ankle which is no better than last week. She cannot  sleep. She also states she has a temperature of up to 101, coughing and complaining of visual loss in her bilateral eyes. Apparently she was tested for Covid 2 weeks ago at [facility] and that was negative. Readmission: 09/03/18 patient presents back for reevaluation after having been evaluated at the end of April regarding erythema and swelling of her right lower extremity. Subsequently she ended up going to the hospital on 07/29/18 and was admitted not to be discharged until 08/08/18. Unfortunately it was noted during the time that she was in the hospital that she did have methicillin-resistant Staphylococcus aureus as the infection noted at the site. It was also determined that she did have osteomyelitis which appears to be fairly significant. She was treated with vancomycin and in fact is still on IV vancomycin at dialysis currently. This is actually slated to continue until 09/12/18 at least which will be the completion of the six weeks of therapy. Nonetheless based on what I'm seeing at this point I'm not sure she will be anywhere near ready to discontinue antibiotics at that time. Since she was released from the hospital she was seen by Dr. Amalia [name] who is her podiatrist on 08/27/18. His note specifically states that he is recommended that the patient needs of one knee amputation on the right as she has a life- threatening situation that can lead quickly to sepsis. The patient advised she would like to try to save her leg to which Dr. Amalia [name] apparently told her that this was against all medical advice. She also want to discontinue the Wound VAC which had been initiated due to the fact that she wasn't pleased with how the wound was looking and subsequently she wanted to pursue applying Medihoney at that time. He stated that he did not believe that  the right lower extremity was salvageable and that the patient understood but would still like to attempt hyperbaric option therapy if it could be of any benefit. She  was therefore referred back to [place] for further evaluation. He plans to see her back next week. Upon inspection today patient has a significant amount purulent drainage noted from the wound at this point. The bone in the distal portion of her foot also appears to be extremely necrotic and spongy. When I push down on the bone it bubbles and seeps purulent drainage from deeper in the end of the foot. I do not think that this is likely going to heal very well at all and less aggressive surgical debridement were undertaken more than what I believe we can likely do here in our office. 09/12/2018; I have not seen this patient since the most recent hospitalization although she was in our clinic last week. I have reviewed some of her records from a complex hospitalization. 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 There is situation was complicated by MRSA bacteremia and acute on chronic renal failure now on dialysis. She is receiving vancomycin at dialysis. We started her on Dakin's wet-to-dry last week she is changing this daily. There is still purulent drainage coming out of her foot. Although she is apparently "agreeable" to a below-knee amputation which is been suggested by multiple clinicians she wants this to be done in [place]. She apparently has a telehealth visit with that provider sometime in late [place] 6/24. I have told her I think this is probably too long. Nevertheless I could not convince her to allow a local doctor to perform BKA. 09/19/2018; the patient has a large necrotic area on the right anterior foot. She has had previous transmetatarsal amputations. Culture I did last week showed MRSA nothing else she is on vancomycin at dialysis. She has continued leaking purulent drainage out of the distal part of the large circular wound on the right anterior foot. She apparently went to see Dr. Berenice Primas of orthopedics to discuss scheduling of her  below-knee amputation. Somehow that translated into her being referred to plastic surgery for debridement of the area. I gather she basically refused amputation although I do not have a copy of Dr. Berenice Primas notes. The patient really wants to have a trial of hyperbaric oxygen. I agreed with initial assessment in this clinic that this was probably too far along to benefit however if she is going to have plastic surgery I think she would benefit from ancillary hyperbaric oxygen. The issue here is that the patient has benefited as maximally as any patient I have ever seen from hyperbaric oxygen therapy. Most recently she had exposed bone on the lateral part of her left foot after a surgical procedure and that actually has closed. She has eschared areas in both heels but no open area. She is remained systemically well. I am not optimistic that anything can be done about this but the patient is very clear that she wants an attempt. The attempt would include a wound VAC further debridements and hyperbaric oxygen along with IV antibiotics. 6/26; I put her in for a trial of hyperbaric oxygen only because of the dramatic response she has had with wounds on her left midfoot earlier this year which was a surgical wound that went straight to her bone over the metatarsal heads and also remotely the left third toe. We will see if we can get this through  our review process and insurance. She arrives in clinic with again purulent material pouring out of necrotic bone on the top of the foot distally. There is also some concerning erythema on the front of the leg that we marked. It is bit difficult to tell how tender this is because of neuropathy. I note from infectious disease that she had her vancomycin extended. 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 She had the wound VAC on for part of the week. The rest of the time she is putting various things on this including Medihoney, "ionized  water" silver sorb gel etc. 7/7; follow-up along with HBO. She is still on vancomycin at dialysis. She has a large open area on the dorsal right foot and a small dark eschar area on her heel. There is a lot less erythema in the area and a lot less tenderness. From an infection point of view I think this is better. She still has a lot of necrosis in the remaining right forefoot [previous TMA] we are still using the wound VAC in this area 7/16; follow-up along with HBO. I put her on linezolid after she finished her vancomycin. We started this last Friday I gave her 2 weeks worth. I had the expectation that she would be operatively debrided by Dr. Marla Roe but that still has not happened yet. Patient phoned the office this week. She arrives for review today after HBO. The distal part of this wound is completely necrotic. Nonviable pieces of tendon bone was still purulent drainage. Also concerning that she has black eschar over the heel that is expanding. I think this may be indicative of infection in this area as well. She has less erythema and warmth in the ankle and calf but still an abnormal exam 7/21 follow-up along with HBO. I will renew her linezolid after checking a CBC with differential monitoring her blood counts especially her platelets. She was supposed to have surgery yesterday but if I am reading things correctly this was canceled after her blood sugar was found to be over 500. I thought Dr. Marla Roe who called me said that they were sending her to the ER but the patient states that was not the case. 7/28. Follow-up along with HBO. She is on linezolid I still do not have any lab work from dialysis even though I called last week. The patient is concerned about an area on her left lateral foot about the level of the base of her fifth metatarsal. I did not really see anything that ominous here however this patient is in [place] ability to point out problems that she is sensing and she has  been accurate in the past Finally she received a call from Dr. Marla Roe who is referring her to another orthopedic surgeon stating that she is too booked up to take her to the operating room now. Was still using a wound VAC on the foot 8/3 -Follow-up after HBO, she is got another week of linezolid, she is to call ID for an appointment, x-rays of both feet were reviewed, the left foot x-ray with third MTP joint osteo- Right foot x-ray widespread osteo-in the right midfoot Right ankle x-ray does not show any active evidence of infection 8/11-Patient is seen after HBO, the wounds on the right foot appear to be about the same, the heel wound had some necrotic base over tendon that was debrided with a curette 8/21; patient is seen after HBO. The patient's wound on her dorsal  foot actually looks reasonably good and there is substantial amount of epithelialization however the open area distally still has a lot of necrotic debris partially bone. I cannot really get a good sense of just how deep this probes under the foot. She has been pressuring me this week to order medical maggots through a company in [place] for her. The problem I have is there is not a defined wound area here. On the positive side there is no purulence. She has been to see infectious disease she is still on Septra DS although I have not had a chance to review their notes 8/28; patient is seen in conjunction with HBO. The wounds on her foot continued to improve including the right dorsal foot substantially the, the distal part of this wound and the area on the right heel. We have been using a wound VAC over this chronically. She is still on trimethoprim as directed by infectious disease 9/4; patient is seen in conjunction with HBO. Right dorsal foot wound substantially anteriorly is better however she continues to have a deep wound in the distal part of this that is not responding. We have been using silver collagen under border  foam Area on the right plantar medial heel seems better. We have been using Hydrofera Blue 12/12/18 on evaluation today patient appears to be doing about the same with regard to her wound based on prior measurements. She does have some necrotic tissue noted on the lateral aspect of the wound that is going require a little bit of sharp debridement today. This includes what appears to be potentially either severely necrotic bone or tendon. Nonetheless other than that she does not appear to have any severe infection which is good news 9/18; it is been 2 weeks since I saw this wound. She is tolerating HBO well. Continued dramatic improvement in the area on the right dorsal foot. She still has a small wound on the heel that we have been using Hydrofera Blue. She continues with a wound VAC 9/24; patient has to be seen emergently today with a swelling on her right lateral lower leg. She says that she told Dr. Evette Doffing about this and also myself on a couple of occasions but I really have no recollection of this. She is not systemically unwell and her wound really looked good the last time I saw this. She showed this to providers at dialysis and she was able to verify that she was started on cephalexin today for 5 doses at dialysis. She dialyzes on Tuesday Thursday and Saturday. 10/2; patient is seen in conjunction with HBO. The area that is draining on the right anterior medial tibia is more extensive. Copious amounts of serosanguineous drainage with some purulence. We are still using the wound VAC on the original wound then it is stable. Culture I did of the original IandD showed MRSA I contacted dialysis she is now on vancomycin with dialysis treatments. I asked them to run a month 10/9; patient seen in conjunction with HBO. She had a new spontaneous open area just above the wound on the right medial tibia ankle. More swelling on the right medial tibia. Her wound on the foot looks about the same perhaps  slightly better. There is no warmth spreading up her leg but no obvious erythema. her MRI of the foot and ankle and distal tib-fib is not booked for next Friday I discussed this with her in great detail over multiple days. it is likely she has spreading infection upper leg  at least involving the distal 25% above the ankle. She knows that if I refer her to orthopedics for infectious disease they are going to recommend amputation and indeed I am not against this myself. We had a good trial at trying to heal the foot which is what she wanted along with antibiotics debridement and HBO however she clearly has spreading infection [probably staph aureus/MRSA]. Nevertheless she once again tells me she wants to wait the left of the MRI. She still makes comments about having her amputation done in [place]. 10/19; arrives today with significant swelling on the lateral right leg. Last culture I did showed Klebsiella. Multidrug-resistant. Cipro was intermediate sensitivity and that is what I have her on pending her MRI which apparently is going to be done on Thursday this week although this seems to be moving back and forth. She is not systemically unwell. We are using silver alginate on her major wound area on the right medial foot and the draining areas on the right lateral lower leg 10/26; MRI showed extensive abscess in the anterior compartment of the right leg also widespread osteomyelitis involving osseous structures of the midfoot and portions of the hindfoot. Also suspicion for osteomyelitis anterior aspect of the distal medial malleolus. Culture I did of the purulence once again showed a multidrug-resistant Klebsiella. I have been in contact with nephrology late last week and she has been started on cefepime at dialysis to replace the vancomycin We sent a copy of her MRI report to Dr. Geroge Baseman in [place] who is an orthopedic surgeon. The patient takes great stock in his opinion on this. She says she  will go to [place] to have her leg amputated if Dr. Geroge Baseman does not feel there is any salvage options. 11/2; she still is not talk to her orthopedic surgeon in [place]. Apparently he will call her at [phone] this afternoon. The quality of this is she has not allowed me to refer her anywhere. She has been told over and over that she needs this amputated but has not agreed to be referred. She tells me her blood sugar was 600 last night but she has not been febrile. 11/9; she never did got a call from the orthopedic surgeon in [place] therefore that is off the radar. We have arranged to get her see orthopedic surgery at [facility]. She still has a lot of draining purulence coming out of the new abscess in her right leg although that probably came from the osteomyelitis in her right foot and heel. Meanwhile the original wound on the right foot looks very healthy. Continued improvement. The issue is that the last MRI showed osteomyelitis in her right foot extensively she now has an abscess in the right anterior lower leg. There is nobody in [place] who will offer this woman anything but an amputation and to be honest that is probably what she needs. I think she still wants to talk about limb salvage although at this point I just do not see that. She has completed her vancomycin at dialysis which was for the original staph aureus she is still on cefepime for the more recent Klebsiella. She has had a long course of both of these antibiotics which should have benefited the osteomyelitis on the right foot as well as the abscess. 11/16; apparently [place] elective surgery is shut down because of COVID-19 pandemic. I have reached out to some contacts at [facility] to see if we can get her an orthopedic appointment there. I am concerned about continually  leaving this but for the moment everything is static. In fact her original large wound on this foot is closing down. It is the abscess on the right  anterior leg that continues to drain purulent serosanguineous material. She is not currently on any antibiotics however she had a prolonged course of vancomycin [1 month] as well as cefepime for a month 02/24/2019 on evaluation today patient appears to be doing better than the last time I saw her. This is not a patient that I typically see. With that being said I am covering for Dr. Dellia Nims this week and again compared to when I last saw her overall the wounds in particular seem to be doing significantly better which is good news. With that being said the patient tells me several disconcerting things. She has not been able to get in to see anyone for potential debridement in regard to her leg wounds although she tells me that she does not think it is necessary any longer because she is taking care of that herself. She noticed a string coming out of the lower wound on her leg over the last week. The patient states that she subsequently decided that we must of pack something in there and started pulling the string out and as it kept coming and coming she realized this was likely her tendon. With that being said she continued to remove as much of this as she could. She then I subsequently proceeded to using tubes of antibiotic ointment which she will stick down into the wound and then scored as much as she can until she sees it coming out of the other wound opening. She states that in doing this she is actually made things better and there is less redness and irritation. With regard to her foot wound she does have some necrotic tendon and tissue noted in one small corner but again the actual wound itself seems to be doing better with good granulation in general compared to my last evaluation. 12/7; continued improvement in the wound on the substantial part of the right medial foot. Still a necrotic area inferiorly that required debridement but the rest of this looks very healthy and is contracting. She has  2 wounds on the right lateral leg which were her original drainage sites from her abscess but all of this looks a lot better as well. She has been using silver alginate after putting antibiotic biotic ointment in one wound and watching it come out the other. I have talked to her in some detail today. I had given her names of orthopedic surgeons at [facility] for second opinion on what to do about the right leg. I do not think the patient never called them. She has not been able to get a hold of the orthopedic surgeon in [place] that she had put a lot of faith in as being somebody would give her an opinion that she would trust. I talked to her today and said even if I could get her in to another orthopedic surgeon about the leg which she accept an amputation and she said she would not therefore I am not going to press this issue for the moment 12/14; continued improvement in his substantial wound on the right medial foot. There is still a necrotic area inferiorly with tightly adherent necrotic debris which I have been working on debriding each time she is here. She does not have an orthopedic appointment. Since last time she was here I looked over her cultures which were  essentially MRSA on the foot wound and gram-negative rods in the abscess on the anterior leg. 12/21; continued improvement in the area on the right medial foot. She is not up on this much and that is probably a good thing since I do not know it could support continuous ambulation. She has a small area on the right lateral leg which were remanence of the IandD's I did because of the abscess. I think she should probably have prophylactic antibiotics I am going to have to look this over to see if we can make an intelligent decision here. In the meantime her major wound is come down nicely. Necrotic area inferiorly is still there but looks a lot better 04/06/2019; she has had some improvement in the overall surface area on the right medial  foot somewhat narrowedr both but somewhat longer. The areas on the right lateral leg which were initial IandD sites are superficial. Nothing is present on the right heel. We are using silver alginate to the wound areas 1/18; right medial foot somewhat smaller. Still a deep probing area in the most distal recess of the wound. She has nothing open on the right leg. She has a new wound on the plantar aspect of her left fourth toe which may have come from just pulling skin. The patient using Medihoney on the wound on her foot under silver alginate. I cannot discourage her from this 2/1; 2-week follow-up using silver alginate on the right foot and her left fourth toe. The area on the right dorsal foot is contracted although there is still the deep area in the most distal part of the wound but still has some probing depth. No overt infection 2/15; 2-week follow-up. She continues to have improvement in the surface area on the dorsal right foot. Even the tunneling area from last time is almost closed. The area that was on the plantar part of her left fourth toe over the PIP is indeed closed 3/1; 2-week follow-up. Continued improvement in surface area. The original divot that we have been debriding inferiorly I think has full epithelialization although the epithelialization is gone down into the wound with probably 4 mm of depth. Even under intense illumination I am unable to see anything open here. The remanence of the wound in this area actually look quite healthy. We have been using silver alginate 3/15; 2-week follow-up. Unfortunately not as good today. She has a comma shaped wound on the dorsal foot however the upper part of this is larger. Under illumination debris on the surface She also tells [place] that she was on her right leg 2 times in the last couple of weeks mostly to reach up for things above her head etc. She felt a sharp pain in the right leg which she thinks is somewhere from the ankle to the  knee. The patient has neuropathy and is really uncertain. She cannot feel her foot so she does not think it was coming from there 3/29; 2-week follow-up. Her wound measures smaller. Surface of the wound appears reasonable. She is using silver alginate with underlying Medihoney. She has home health. X-rays I did of her tib-fib last time were negative although it did show arterial calcification 4/12; 2-week follow-up. Her wound measures smaller in length. Using manuka honey with silver alginate on top. She has home health. 4/26; 2-week follow-up. Her wound is smaller but still very adherent debris under illumination requiring debridement she has been using manuka honey with silver alginate. She has home health 08/28/19-Wound  has about the same size, but with a layer of eschar at the lateral edge of the amputation site on the right foot. Been using Hydrofera Blue. She is on suppressive Bactrim but apparently she has been taking it twice daily 6/7; I have not seen this wound and about 6 weeks. Since then she was up in [place]. By her own admission she was walking on the foot because she did not have a wheelchair. The wound is not nearly as healthy looking as it was the last time I saw this. We ordered different things for her but she only uses Medihoney and silver alginate. As far as I know she is on suppressive trimethoprim sulfamethoxazole. She does not admit to any fever or chills. Her CBGs apparently are at baseline however she is saying that she feels some discomfort on the lateral part of her ankle I looked over her last inflammatory markers from the summer 2020 at which time she had a deeply necrotic infected wound in this area. On 11/10/2018 her sedimentation rate was 56 and C-reactive protein 9.9. This was 107 and 29 on 07/29/2018. 6/17; the patient had a necrotic wound the last time she was here on the right dorsal foot. After debridement I did a culture. This showed a very resistant  ESBL Klebsiella as well as Enterococcus. Her x-ray of the foot which was done because of warmth and some discomfort showed bone destruction within the carpal bones involving the navicular acute cuboid lateral middle cuneiforms but essentially unchanged from her prior study which was done on 10/29/2018. The findings were felt to represent chronic osteomyelitis. We did inflammatory markers on her. Her white count was 5.25 sedimentation rate 16 and C-reactive protein at 11.1. Notable for the fact that in August 2020 her CRP was 9.9 and sedimentation rate 56. I have looked at her x-rays. It is true that the bone destruction is very impressive however the patient came into this clinic for the wound on her right foot with pieces of bone literally falling out anteriorly with purulent material. I am not exactly sure I could have expected anything different. She has not been systemically unwell no fever chills or blood sugars have been reasonable. 6/28; she arrives with a right heel closed. The substantial area on the right anterior foot looks healthy. Much better looking surface. I think we can change to [facility] seems to help this previously. She is getting her antibiotics at dialysis she should be just about finished 7/9; changed to [facility] last week. Surface wound looks satisfactory not much change in surface area however. She is going to [place] state next week this is usually a difficult thing for this patient follow-up will be for 2 weeks. 7/23; using Hydrofera Blue. She returns from her trip and the wound looks surprisingly good. Usually when this patient goes on trips she comes back with a lot of problems with the wound. She is saying that she sometimes feels an episodic "crunching" feeling on the lateral part of the foot. She is neuropathic and not feeling pain but wonders whether this could be a neuropathic dysesthesia. 11/13/19-Patient returns after 3 weeks, the wound itself is stable  and patient states that there is nothing new going on she is on some extra anxiety medications and is resisting the temptation to pick at the dry skin around the wound. 9/20; patient has not been here in over a month and I have not seen her in 2 months. The wound in terms of  size I think is about the same. There is no exposed bone. She has a nonviable surface on this. She is supposed to be using [facility] however she is also been using some form of honey preparation as well as a silver-based dressings. I do not think she has any pattern to this. 10/4; 2-week follow-up. Patient has been using some form of spray which she says has honey and silver to purchase this online she has been covering it with gauze. In spite of this the wound actually looks quite good. The deeper divot distally appears to be close down. There is a rim of epithelialization. 10/18; 2-week follow-up. Patient has been using her Hydrofera Blue covered with her silver honey spray that she got online. 11/1; 2-week follow-up. She is using Hydrofera Blue with a silver honey spray. Wound bed is measuring smaller. She has noticed that her foot is warmer on the right. She is concerned about infection. For a long period of time I had her on prophylactic trimethoprim sulfamethoxazole DS 1 tablet daily. She is asking for this to be restarted. The patient is walking on this foot because of repairs that are being done in a home her but her room is on the second floor she has to go up and down stairs. I have cautioned against this however as usual she will do exactly what she wants to do 11/15; 2-week follow-up. She uses Hydrofera Blue with a silver/honey spray which I have never heard of. I think her wound looks about the same. Some epithelialization. No evidence that this is infected. I think she is walking on this more than we agreed on. She is going on extensive vacation over Thanksgiving 12/3; 2-week follow-up. She is using Hydrofera  Blue however over Thanksgiving she ran out of this and she is simply been using Medihoney. In spite of this her wound is smaller almost divided into 2 now. She traveled extensively over Thanksgiving and actually looks quite good in spite of this. Usually this is been a marker of problems for her 12/17; 2-week follow-up. She is using Hydrofera Blue. The wound is smaller. Debris on the surface of this is fibrinous. She is traveling to [place] over the holidays which never bodes well for her wounds. I think she is walking more on her feet then she is even willing to admit and she tells me she does walk 2/11; using Hydrofera Blue. Her wounds are contracting however she walks in the clinic with a history that she has not been able to eat she has had vomiting. She also has right eye problems. Her blood sugar was 567. She had blood work done at dialysis and we called there to get her blood work although it still had not returned although they should have this by the end of the day we were told. She also stated that she was not sure what her blood sugar was as her glucose monitor was not working and not coming to tomorrow. She also for some reason does not think her insulin pump is working well. Her endocrinologist is Dr. Elayne Snare 06/03/2020 upon evaluation today patient actually appears to be doing excellent in regard to her wound. In fact she tells me it was not even bleeding until she picked a dry piece of skin off while we were getting ready to come in and see her today. Fortunately there is no signs of active infection which is great news and overall very pleased. She is going to be seeing  her neurologist sometime shortly I believe it might even be on Monday. Nonetheless there can proceed with the work-up as far as anything else going on with her eye the fact that her right eyelid is drooping. 3/25; right medial dorsal foot. She has superficial areas here that appear to be fully epithelialized she is  using Medihoney and silver alginate and some combination. She has a new area on the mid left foot at roughly the level of the fifth metatarsal base 4/84/8; right medial dorsal foot. Still superficial areas here. I cleaned up 1 of these today she has been using I think mostly Medihoney but I would like her to use silver alginate. The area on the mid part of her foot also looks improved on the left. Since she was last here she tells me that she noted pus in the area of her left foot. She told dialysis about this they did a culture and ultimately she is on IV antibiotics but were not sure which one. They referred her to Dr. Amalia [name] he said that he did not need to follow her. I have not really verified any of this and I do not know what antibiotic she is on at dialysis 4/29; patient has been using Medihoney to her wounds. She reports taking off the toenail to her left second toe. She states that home health discharged her and she has not had help with dressing changes. She denies any signs and symptoms of infection. 5/13; 2-week follow-up. Miraculously the wound on the right foot dorsally is healed. She has an area on the tip of her left third toe and an area in the left midfoot. 5/26; patient presents for 2-week follow-up. She has 2 open wounds 1 to her plantar foot and the other to the left third toe. She has noticed increased swelling and redness to the toe wound. It is unclear exactly what she is doing for dressing changes as she has multiple dressing options at home. She states that it is easiest to do [facility] on the plantar wound and Prisma or Medihoney on the toe wound. 6/3; saw Dr. Maryjane Hurter week. She put her on Keflex because of the swelling I think of the left second toe. She arrives today with new breakdown x3 on the dorsal right foot which is disappointing. She also complains of pain in the right ankle, 3 liquid stools per day, chills without fever 6/17; patient was in [place]. I think a  little more ambulatory than I would like to hear. In any case she still has the 3 small open areas on the dorsal part of her foot on the right medial leg these look about the same as last time. She is supposed to be using Prisma but I think used Medihoney. She has an area on the left lateral plantar foot which had undermining under her skin and a blood blister with the 2 areas communicating today. And finally an area on the tip of her left second toe 6/30; patient has 3 small areas that look better than the last time on the right dorsal foot. She uses a combination of her products including collagen, Hydrofera Blue and the Medihoney nevertheless they look better. The area on the left midfoot might have been healed although she pulled some skin off superficial open area. The left second toe still does not look that healthy. Open areas on the tips of the 7/21; the areas on her right dorsal foot are still present although they look benign. We  are using silver collagen although she may be using any of the products she had at home. The real problem here is the left second toe. Necrotic wound over the distal phalanx and distal interphalangeal joint. This probes directly to bone. She arrives in clinic today with an interesting story. She went to [place] last week. She became ill there with refractory nausea and vomiting. She went to an urgent care and then the ER. I am able to see parts of this in Care Everywhere. She had 2 blood cultures done both of which ultimately showed MRSA. This was sensitive to daptomycin and linezolid and vancomycin. Her white count was 4.8 hemoglobin 10.4. X-ray of the left foot showed prior resection of the left fifth metatarsal no evidence of osteomyelitis. Her C-reactive protein on 10/13/2020 was 104.8. She refused admission to the hospital as she was to board her flight to return to [place]. She dialyzed this morning I am not certain why she did not bring this to their  attention 7/27; fortunately the left second toe and the erythema in her left foot looks a lot better on the IV vancomycin at dialysis. I was able to verify earlier this week that she had indeed received it on the weekend starting last Saturday. She will need at least 4 weeks. I saw her left arm shunt and it looks fine. We are using silver alginate on the toe and we have been using I think Medihoney on the right foot which is actually been her decision. She has 2 open wounds dorsally. She does not describe fever chills her blood sugars are erratic but seem to be at her baseline 8/18; she has completed her antibiotics at dialysis but is unsure how long this was ordered. I have asked her to check when she goes to dialysis on Saturday. She is using polymen on the right foot dorsally and silver collagen not silver alginate to the left second toe. Her blood pressure is high today but blood sugars have been stable according to her she is having no fever or systemic symptoms 9/14; about a month since we have seen her. In the interim she was admitted to hospital from 11/21/2020 through 12/01/2020 with delirium secondary to sepsis. She was felt to have sepsis from a left second toe MRSA infection. She is on IV vancomycin and I think will complete 6 weeks of this on 12/29/2020. Her blood cultures were negative. A transthoracic echo did not show any vegetations. I do not believe she had a TEE. She comes in today with a sizable wound on the right medial foot with some necrotic surface in the mid part of this and also a wound on the tip of her left second toe. She has been using Medihoney 10/18; I have not seen this woman in about a month. In the meantime her left second toe seems to have healed over although likely that there has been bone or joint destruction in the distal part of the second toe. The area on the right lateral foot is worse. Our intake nurses noted green drainage. Not completely clear as usual what  she is using here. She tells me that she did see ground-up some antibiotics likely cephalexin although I am not sure about this and she took 2 days worth. She had called some weeks ago about the amount of antibiotics she got for her MRSA sepsis. We referred her back to dialysis but she claims that she really only got a week of therapy. I  find that hard to believe 11/15; 1 month follow-up. She comes in with a small wound on the right dorsal foot better than last time. As usual she is using some combination of Medihoney and silver alginate. She was traveling in [facility] but fortunately this time when she traveled to the wounds do not seem to have suffered. There is nothing open on the left foot Electronic Signature(s) Signed: 02/14/2021 4:37:23 PM By: Nancy Ham MD Entered By: [name] on 02/14/2021 13:48:29 -------------------------------------------------------------------------------- Physical Exam Details Patient Name: Date of Service: Nancy LLO [name] 02/14/2021 1:00 PM Medical Record Number: [id number] Patient Account Number: [id number] Date of Birth/Sex: Treating RN: 08-Sep-1970 (50 y.o. [name], [name] Primary Care Provider: Sanjuana [name], [name] LL Other Clinician: Referring Provider: Treating Provider/Extender: [name], [name] LL Weeks in Treatment: 127 Constitutional Patient is hypertensive.. Pulse regular and within target range for patient.Marland Kitchen Respirations regular, non-labored and within target range.. Temperature is normal and within the target range for the patient.Marland Kitchen Appears in no distress. Notes Wound exam; smaller area on the right dorsal foot. Under illumination not a very viable surface however it cleans up quite nicely after debridement with a #3 curette subcutaneous tissue removed. No evidence of surrounding infection Electronic Signature(s) Signed: 02/14/2021 4:37:23 PM By: Nancy Ham MD Entered By: [name] on 02/14/2021  13:49:45 -------------------------------------------------------------------------------- Physician Orders Details Patient Name: Date of Service: Nancy LLO [name] 02/14/2021 1:00 PM Medical Record Number: [id number] Patient Account Number: [id number] Date of Birth/Sex: Treating RN: Apr 26, 1970 (50 y.o. [name] Primary Care Provider: Sanjuana [name], [name] LL Other Clinician: Referring Provider: Treating Provider/Extender: [name], [name] LL Weeks in Treatment: 127 Verbal / Phone Orders: No Diagnosis Coding ICD-10 Coding Code Description L97.514 Non-pressure chronic ulcer of other part of right foot with necrosis of bone E10.621 Type 1 diabetes mellitus with foot ulcer L97.529 Non-pressure chronic ulcer of other part of left foot with unspecified severity M86.172 Other acute osteomyelitis, left ankle and foot Follow-up Appointments Return appointment in 3 weeks. - Dr. Dellia Nims Bathing/ Shower/ Hygiene May shower and wash wound with soap and water. Edema Control - Lymphedema / SCD / Other Bilateral Lower Extremities Elevate legs to the level of the heart or above for 30 minutes daily and/or when sitting, a frequency of: - throughout the day Avoid standing for long periods of time. Moisturize legs daily. - with dressing changes Off-Loading Open toe surgical shoe to: - to both feet Other: - minimal weight bearing right foot Wound Treatment Wound #52 - Foot Wound Laterality: Right Cleanser: Soap and Water Every Other Day/30 Days Discharge Instructions: May shower and wash wound with dial antibacterial soap and water prior to dressing change. Cleanser: Wound Cleanser (Generic) Every Other Day/30 Days Discharge Instructions: Cleanse the wound with wound cleanser prior to applying a clean dressing using gauze sponges, not tissue or cotton balls. Prim Dressing: MediHoney Gel, tube 1.5 (oz) Every Other Day/30 Days ary Discharge Instructions: Apply to wound bed as  instructed Secondary Dressing: Woven Gauze Sponge, Non-Sterile 4x4 in (DME) (Generic) Every Other Day/30 Days Discharge Instructions: Apply over primary dressing as directed. Secondary Dressing: ABD Pad, 5x9 (DME) (Generic) Every Other Day/30 Days Discharge Instructions: Apply over primary dressing as directed. Secured With: Elastic Bandage 4 inch (ACE bandage) (DME) (Generic) Every Other Day/30 Days Discharge Instructions: Secure with ACE bandage as directed. Secured With: [employer], 4.5x3.1 (in/yd) (DME) (Generic) Every Other Day/30 Days Discharge Instructions:  Secure with Kerlix as directed. Secured With: Paper Tape, 1x10 (in/yd) (DME) (Generic) Every Other Day/30 Days Discharge Instructions: Secure dressing with tape as directed. Electronic Signature(s) Signed: 02/14/2021 4:37:23 PM By: Nancy Ham MD Signed: 02/14/2021 4:38:08 PM By: Lorrin Jackson Entered By: Lorrin Jackson on 02/14/2021 13:41:32 -------------------------------------------------------------------------------- Problem List Details Patient Name: Date of Service: Nancy LLO [name] 02/14/2021 1:00 PM Medical Record Number: [id number] Patient Account Number: [id number] Date of Birth/Sex: Treating RN: Feb 18, 1971 (50 y.o. [name] Primary Care Provider: Sanjuana [name], [name] LL Other Clinician: Referring Provider: Treating Provider/Extender: [name], [name] LL Weeks in Treatment: 127 Active Problems ICD-10 Encounter Code Description Active Date MDM Diagnosis L97.514 Non-pressure chronic ulcer of other part of right foot with necrosis of bone 09/03/2018 No Yes E10.621 Type 1 diabetes mellitus with foot ulcer 09/24/2018 No Yes L97.529 Non-pressure chronic ulcer of other part of left foot with unspecified severity 07/29/2020 No Yes M86.172 Other acute osteomyelitis, left ankle and foot 10/20/2020 No Yes Inactive Problems ICD-10 Code Description Active Date Inactive Date M86.671 Other  chronic osteomyelitis, right ankle and foot 09/03/2018 09/03/2018 L97.411 Non-pressure chronic ulcer of right heel and midfoot limited to breakdown of skin 09/17/2019 09/17/2019 L97.521 Non-pressure chronic ulcer of other part of left foot limited to breakdown of skin 04/20/2019 04/20/2019 L97.812 Non-pressure chronic ulcer of other part of right lower leg with fat layer exposed 02/24/2019 02/24/2019 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 02/14/2021 1:00 PM Medical Record Number: [id number] Patient Account Number: [id number] Date of Birth/Sex: Treating RN: 1970/11/13 (50 y.o. [name], [name] Primary Care Provider: Sanjuana [name], [name] LL Other Clinician: Referring Provider: Treating Provider/Extender: [name], [name] LL Weeks in Treatment: 127 Subjective History of Present Illness (HPI) When76 year old diabetic who is known to have type 1 diabetes which is poorly controlled last hemoglobin A1c was 11%. She comes in with a ulcerated area on the left lateral foot which has been there for over 6 months. Was recently she has been treated by Dr. Amalia [name] of podiatry who saw her last on 05/28/2016. Review of his notes revealed that the patient had incision and drainage with placement of antibiotic beads to the left foot on 04/11/2016 for possible osteomyelitis of the cuboid bone. Over the last year she's had a history of amputation of the  left fifth toe and a femoropopliteal popliteal bypass graft somewhere in April 2017. 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 His note Dr. Amalia [name] mentions that the patient has been referred to me for further wound care and possibly great candidate for hyperbaric oxygen therapy due to recurrent osteomyelitis. However we do not have any x-rays of biopsy reports confirming this. He has been on several antibiotics including Bactrim and most recently is on doxycycline for an MRSA. I understand, the patient was not a candidate for IV antibiotics as she has had previous PICC lines which resulted in blood clots in both arms. There was a x-ray report dated 04/04/2016 on Dr. Amalia [name] notes which showed evidence of fifth ray resection left foot with osteolytic  changes noted to the fourth metatarsal and cuboid bone on the left. 06/13/2016 -- had a left foot x-ray which showed no acute fracture or dislocation and no definite radiographic evidence of osteomyelitis. Advanced osteopenia was seen. 06/20/2016 -- she has noticed a new wound on the right plantar foot in the region where she had a callus before. 06/27/16- the patient did have her x-ray of the right foot which showed no findings to suggest osteomyelitis. She saw her endocrinologist, [name], yesterday. Her A1c in January was 11. He also indicates mismanagement and noncompliance regarding her diabetes. She is currently on Bactrim for a lip infection. She is complaining of nausea, vomiting and diarrhea. She is unable to articulate the exact orders or dosing of the Bactrim; it is unclear when she will complete this. 07/04/2016 -- results from [facility] of ABIs with ankle waveforms were noted from 02/14/2016. The examination done on 06/27/2015 showed noncompressible ABIs with the right being 1.45 and the left being 1.33. The present examination showed a right ABI of 1.19 on the left of 1.33. The conclusion was that right normal ABI in the  lower extremity at rest however compared to previous study which was noncompressible ABI may be falsely elevated side suggesting medial calcification. The left ABI suggested medial calcification. 08/01/2016 -- the patient had more redness and pain on her right foot and did not get to come to see as noted she see her PCP or go to the ER and decided to take some leftover metronidazole which she had at home. As usual, the patient does report she feels and is rather noncompliant. 08/08/2016 -- -- x-ray of the right foot -- FINDINGS:Transmetatarsal amputation is noted. No bony destruction is noted to suggest osteomyelitis. IMPRESSION: No evidence of osteomyelitis. Postsurgical changes are seen. MRI would be more sensitive for possible bony changes. Culture has grown Serratia Marcescens -- sensitive to Bactrim, ciprofloxacin, ceftazidime she was seen by Dr. Daylene [name] on 08/06/2016. He did not find any exposed bone, muscle, tendon, ligament or joint. There was no malodor and he did a excisional debridement in the office. ============ Old notes: 50 year old patient who is known to the wound clinic for a while had been away from the wound clinic since 09/01/2014. Over the last several months she has been admitted to various hospitals including [facility] [place]. She was treated for a right metatarsal osteomyelitis with a transmetatarsal amputation and this was done about 2 months ago. He has a small ulcerated area on the right heel and she continues to have an ulcerated area on the left plantar aspect of the foot. The patient was recently admitted to the [facility] hospital group between 7/12 and 10/18/2014. she was given 3 weeks of IV vancomycin and was to follow-up with her surgeons at [facility] and also took oral vancomycin for C. difficile colitis. 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 MRI of the left foot done on 10/14/2014 showed no abscess or osteomyelitis. 04/27/15; this is a patient we know from previous stays in the wound care center. She is a type I diabetic I am not sure of her control currently. Since the last time I saw her she is had a right transmetatarsal amputation and has no wounds on her right foot and has no  open wounds. She is been followed at the wound care center at [facility] in [place]. She comes today with the desire to undergo hyperbaric treatment locally. Apparently one of her wound care providers in [place] has suggested hyperbarics. This is in response to an MRI from 04/18/15 that showed increased marrow signal and loss of the proximal fifth metatarsal cortex evidence of osteomyelitis with likely early osteomyelitis in the cuboid bone as well. She has a large wound over the base of the fifth metatarsal. She also has a eschar over her the tips of her toes on 1,3 and 5. She does not have peripheral pulses and apparently is going for an angiogram tomorrow which seems reasonable. After this she is going to infectious disease at [facility]. They have been using Medihoney to the large wound on the lateral aspect of the left foot to. The patient has known Charcot deformity from diabetic neuropathy. She also has known diabetic PAD. Surprisingly I can't see that she has had any recent antibiotics, the patient states the last antibiotic she had was at the end of November for 10 days. I think this was in response to culture that showed group G strep although I'm not exactly sure where the culture was from. She is also had arterial studies on 03/29/15. This showed a right ABI of 1.4 that was noncompressible. Her left ABI was 0.73. There was a suggestion of superficial femoral  artery occlusion. It was not felt that arterial inflow was adequate for healing of a foot ulcer. Her Doppler waveforms looked monophasic ===== READMISSION 02/28/17; this is in an now 50 year old woman we've had at several different occasions in this clinic. 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 She has a remote ex-smoker. She was last seen in this clinic by Dr. Con Memos I think in May. More recently she is been followed by her podiatrist Dr. Amalia [name] an infectious disease Dr. Megan Salon. She has 2 open wounds the major one is over the right first metatarsal head she also has a wound on the left plantar foot. an MRI of the right foot on 01/01/17 showed a soft tissue ulcer along the plantar aspect of the first metatarsal base consistent with osteomyelitis of the first metatarsal stump. Dr. 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 According to the patient this is been open for slightly over a month. She has been on a combination of Cipro 500 twice a day, Zyvox 600 twice a day and Flagyl 500 3 times a day for over a month now as directed by Dr. Megan Salon. cultures of the right foot earlier this year showed MRSA in January and Serratia in May. January also had a few viridans strep. Recent x-rays of both feet were done and Dr. Amalia [name] office and I don't have these reports. The patient has known PAD and has a history of aleft femoropopliteal bypass in April 2017. 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 Dwyane Dee. In spite of this the last hemoglobin A1c I can see is 10.1 on 01/01/2017. She is being referred by Dr. 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 She  is been using Medihoney to this area and also an area on  the left midfoot. She is using healing sandals bilaterally. ABIs in this clinic at the left posterior tibial was 1.1 noncompressible on the right READMISSION Non invasive vascular NOVANT 5/18 Aftercare following surgery of the circulatory system Procedure Note - Interface, External Ris In - 08/13/2016 11:05 AM EDT Procedure: Examination consists of physiologic resting arterial pressures of the brachial and ankle arteries bilaterally with continuous wave Doppler waveform analysis. Previous: Previous exam performed on 02/14/16 demonstrated ABIs of Rt = 1.19 and Lt = 1.33. Right: ABI = non-compressible PT 1.47 DP. S/P transmet amputation. , Left: ABI = 1.52, 2nd digit pressure = 87 mmHg Conclusions: Right: ABI (>1.3) may be falsely elevated, suggesting medial calcification. Left: ABI (>1.3) may be falsely elevated, suggesting medial calcification The patient is a now 50 year old type I diabetic is had multiple issues her graded to chronic diabetic foot ulcers. 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 We had her in the clinic lastin November. At that point she had wounds on her bilateral feet.she had wanted to try hyperbarics however the healogics review process denied her because she hadn't followed up with her vascular surgeon for her left femoropopliteal bypass. The bypass was done by Dr. Raul Del at [facility]. We made her a follow-up with Dr. Raul Del however she did not keep the appointment and therefore she was not approved The patient shows me a small wound on her left fourth metatarsal head on her phone. She developed rapid discoloration in the plantar aspect of the left foot and she was admitted to hospital from 2/2 through 05/10/17 with wet gangrene of the left foot osteomyelitis of the fourth metatarsal heads. She was admitted acutely ill with a  temperature of 103. She was started on broad-spectrum vancomycin and cefepime. On 05/06/17 she was taken to the OR by Dr. Amalia [name] her podiatric surgeon for an incision and drainage irrigation of the left foot wound. Cultures from this surgery revealed group be strep and anaerobes. she was seen by Dr.Xu of orthopedic surgery and scheduled for a below-knee amputation which she u refused. Ultimately she was discharged on Levaquin and Flagyl for one month. MRI 05/05/17 done while she was in the hospital showed abscess adjacent to the fourth metatarsal head and neck small abscess around the fourth flexor tendon. Inflammatory phlegmon and gas in the soft tissues along the lateral aspect of the fourth phalanx. Findings worrisome for osteomyelitis involving the fourth proximal and middle phalanx and also the third and fourth metatarsals. Finally the patient had actually shortly before this followed up with Dr. Raul Del at no time on 04/29/17. He felt that her left femoropopliteal bypass was patent he felt that her left-sided toe pressures more than adequate for healing a wound on the left foot. This was before her acute presentation. 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 05/28/17; she is started hyperbarics. The patient tells me that for some reason she was not actually on Levaquin but I think on ciprofloxacin. She was on Flagyl. She only started her Levaquin yesterday due to some difficulty with the pharmacy and perhaps her sister picking it up. She has an appointment with Dr. Amalia [name] tomorrow and with infectious disease early next week. She has no new complaints 06/06/17; the patient continues in hyperbarics. She saw Dr. Amalia [name] on 05/29/17 who is her podiatric surgeon. He is elected for a transmetatarsal amputation on 06/27/17. I'm not sure at what level he plans to do this amputation. The patient is unaware   ooShe also saw Dr. Megan Salon of infectious disease who elected to continue her on current antibiotics I think  this is ciprofloxacin and Flagyl. I'll need to clarify with her tomorrow if she actually has this. We're using silver alginate to the actual wound. Necrotic surface today with material under the flap of her foot. ooOriginal MRI showed abscesses as well as osteomyelitis of the proximal and middle fourth phalanx and the third and fourth metatarsal heads 06/11/17; patient continues in hyperbarics and continues on oral antibiotics. She is doing well. The wound looks better. The necrotic part of this under the flap in her superior foot also looks better. she is been to see Dr. Amalia [name]. I haven't had a chance to look at his note. Apparently he has put the transmetatarsal amputation on hold her request it is still planning to take her to the OR for debridement and product application ACEL. I'll see if I can find his note. I'll therefore leave product ordering/requests to Dr. Amalia [name] for now. I was going to look at Dermagraft 06/18/17-she is here in follow-up evaluation for bilateral foot wounds. She continues with hyperbaric therapy. She states she has been applying manuka honey to the right plantar foot and alternate manuka honey and silver alginate to the left foot, despite our orders. We will continue with same treatment plan and she will follo up next week. 06/25/17; I have reviewed Dr. Amalia [name] last note from 3/11. She has operative debridement in 2 days' time. By review his note apparently they're going to place there is skin over the majority of this wound which is a good choice. She has a small satellite area at the most proximal part of this wound on the left plantar foot. The area on the right plantar foot we've been using silver alginate and it is close to healing. 07/02/17; unfortunately the patient was not easily approved for Dr. Amalia [name] proposed surgery. I'm not completely certain what the issue is. She has been using silver alginate to the wound she has completed a first course of hyperbarics. She is still  on Levaquin and Flagyl. I have really lost track of the time course here.I suspect she should have another week to 2 of antibiotics. I'll need to see if she is followed up with infectious disease Dr. Megan Salon 07/09/17; the patient is followed up with Dr. Megan Salon. 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 She continues on Levaquin and metronidazole continuing both of these for now I think she is been on fr about 6 weeks. She still has some drainage but no pain. No fever. Her had been plans for her to go to the OR for operative debridement with her podiatrist Dr. Amalia [name], I am not exactly sure where that is. I'll probably slip a note to Dr. Amalia [name] today. I note that she follows with Dr. Dwyane Dee of endocrinology. We have her recertified for hyperbaric oxygen. I have not heard about Dermagraft however I'll see if Dr. Amalia [name] is planning a skin substitute as well 07/16/17; the patient tells me she is just about out of [employer]. I'll need to check Dr. Hale Bogus last notes on this. She states she has plenty of Flagyl however. She comes in today complaining of pain in the right lateral foot which she said lasted for about a day. The wound on the right foot is actually much more medially. She also tells me that the [facility] cost a lot of pain in the left foot  wound and she turned back to silver alginate. Finally Dermagraft has a $027 per application co-pay. She cannot afford this 07/23/17; patient arrives today with the wound not much smaller. There is not much new to add. She has not heard from Dr. Amalia [name] all try to put in a call to them today. She was asking about Dermagraft again and she has an over $741 per application co-pay she states that she would be willing to try to do a payment plan. I been tried to avoid this. We've been using silver alginate, I'll change to [facility] 07/30/17-She is here in follow-up evaluation for left foot ulcer. She continues  hyperbaric medicine. The left foot ulcer is stable we will continue with same treatment plan 08/06/17; she is here for evaluation of her left foot ulcer. Currently being treated for hyperbarics or underlying osteomyelitis. She is completed antibiotics. The left foot ulcer is better smaller with healthier looking granulation. For various reasons I am not really clear on we never got her back to the OR with Dr. Amalia [name]. He did not respond to my secure text message. Nevertheless I think that surgery on this point is not necessary nor am I completely clear that a skin substitute is necessary The patient is complaining about pain on the outside of her right foot. 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 There is no erythema. She also states the foot is warm versus her other part of her upper leg and this is largely true. It is not totally clear to me what's causing this. She thinks it's different from her usual neuropathy pain 08/13/17; she arrives in clinic today with a small wound which is superficial on her right first metatarsal head. 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 She tells [place] she was up on her feet over the Mother's Day celebration. ooThe large wound is on the left foot. Continues with hyperbarics for underlying osteomyelitis. We're using Hydrofera Blue. She asked me today about where we were with Dermagraft. I had actually excluded this because of the co-pay however she wants to assume this therefore I'll recheck the co-pay an order for next week. 08/20/17; the patient agreed to accept the co-pay of the first Apligraf which we applied today. She is disappointed she is finishing hyperbarics will run this through the insurance on the extent of the foot infection and the extent of the wound that she had however she is already had 60 dive's. Dermagraft No. 1 08/27/17; Dermagraft No. 2. She is not eligible for any more hyperbaric treatments this month. She reports  a fair amount of drainage and she actually changed to the external dressings without disturbing the direct contact layer 09/03/17; the patient arrived in clinic today with the wound superficially looking quite healthy. Nice vibrant red tissue with some advancing epithelialization although not as much adherence of the flap as I might like. However she noted on her own fourth toe some bogginess and she brought that to our attention. Indeed this was boggy feeling like a possibility of subcutaneous fluid. 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 She is not been unwell. We've been using Dermagraft 09/10/17; the culture that I did not last week was MRSA. She saw Dr. Megan Salon this morning who is going to start her on vancomycin. I had sent him a secure a text message yesterday. I also spoke with her podiatric surgeon Dr. Amalia [name]  about surgery on this foot the options for conserving a functional foot etc. Promised me he would see her and will make back consultation today. Paradoxically her actual wound on the plantar aspect of her left foot looks really quite good. I had given her 5 days worth of Baxdella to cover her for MRSA. Her MRI came back showing osteomyelitis within the third metatarsal shaft and head and base of the third and fourth proximal phalanx. She had extensive inflammatory changes throughout the soft tissue of the lateral forefoot. With an ill-defined fluid around the fourth metatarsal extending into the plantar and dorsal soft tissues 09/19/17; the patient is actually on oral Septra and Flagyl. She apparently refused IV vancomycin. She also saw Dr. Amalia [name] at my request who is planning her for a left BKA sometime in mid July. MRI showed osteomyelitis within the third metatarsal shaft and head and the basis of the third and fourth proximal phalanx. I believe there was felt to be possible septic arthritis involving the third  MTP. 09/26/17; the patient went back to Dr. Megan Salon at my suggestion and is now receiving IV daptomycin. Her wound continues to look quite good making the decision to proceed with a transmetatarsal amputation although more difficult for the patient. I believe in my extensive discussions with her she has a good sense of the pros and cons of this. I don't NV the tuft decision she has to make. She has an appointment with Dr. Amalia [name] I believe in mid July and I previously spoken to him about this issue Has we had used 3 previous Dermagraft. Given the condition of the wound surface I went ahead and added the fourth one today area and I did this not fully realizing that she'll be traveling to [place] next week. I'm hopeful she can come back in 2 weeks 10/21/17; Her same Dermagraft on for about 3-1/2 weeks. In spite of this the wound arrives looking quite healthy. There is been a lot of healing dimensions are smaller. Looking at the square shaped wound she has now there is some undermining and some depth medially under the undermining although I cannot palpate any bone. No surrounding infection is obvious. She has difficult questions about how to look at this going forward vis--vis amputations versus continued medical therapy. T be truthful the wound is looks so o healthy and it is continued to contract. Hard to justify foot surgery at this point although I still told her that I think it might come to that if we are not able to eradicate the underlying MRSA. She is still highly at risk and she understands this 11/06/17 on evaluation today patient appears to be doing better in regard to her foot ulcer. She's been tolerating the dressing changes without complication. Currently she is here for her Dermagraft #6. Her wound continues to make excellent progress at this point. She does not appear to have any evidence of infection which is good news. 11/13/17 on evaluation today patient appears to be doing excellent  at this time. She is here for repeat Dermagraft application. This is #7. Overall her wound seems to be making great progress. 12/05/17; the patient arrives with the wound in much better condition than when I last saw this almost 6 weeks ago. She still has a small probing area in the left metatarsal head region on the lateral aspect of her foot. We applied her last Dermagraft today. ooSince the last time she is here she has what appears to a been a  blood blister on the plantar aspect of left foot although I don't see this is threatening. There is also a thick raised tissue on the right mid metatarsal head region. This was not there I don't think the last time she was here 3 weeks ago. 12/12/17; the patient continues to have a small programming area in the left metatarsal head region on the lateral aspect of her foot which was the initial large surgical wound. I applied her last Apligraf last week. I'm going to use Endoform starting today ooUnfortunately she has an excoriated area in the left mid foot and the right mid foot. The left midfoot looks like a blistered area this was not opened last week it certainly is open today. Using silver alginate on these areas. She promises me she is offloading this. 12/19/17; the small probing area in the left metatarsal head eyes think is shallower. In general her original wound looks better. We've been using Endoform. The area inferiorly that I think was trauma last week still requires debridement a lot of nonviable surface which I removed. She still has an open open area distally in her foot ooSimilarly on the right foot there is tightly adherent surface debris which I removed. Still areas that don't look completely epithelialized. This is a small open area. We used silver alginate on these areas 12/26/2017; the patient did not have the supplies we ordered from last week including the Endoform. The original large wound on the left lateral foot looks healthy. She  still has the undermining area that is largely unchanged from last week. She has the same heavily callused raised edged wounds on the right mid and left midfoot. Both of these requiring debridement. We have been using silver alginate on these areas 01/02/2018; there is still supply issues. We are going to try to use Prisma but I am not sure she actually got it from what she is saying. She has a new open area on the lateral aspect of the left fourth toe [previous fifth ray amputation]. Still the one tunneling area over the fourth metatarsal head. The area is in the midfoot bilaterally still have thick callus around them. She is concerned about a raised swelling on the lateral aspect of the foot. However she is completely insensate 01/10/2018; we are using Prisma to the wounds on her bilateral feet. Surprisingly the tunneling area over the left fourth metatarsal head that was part of her original surgery has closed down. She has a small open area remaining on the incision line. 2 open areas in the midfoot. 02/10/2018; the patient arrives back in clinic after a month hiatus. She was traveling to visit family in [place]. Is fairly clear she was not offloading the areas on her feet. The original wound over the left lateral foot at the level of metatarsal heads is reopened and probes medially by about a centimeter or 2. She notes that a week ago she had purulent drainage come out of an area on the left midfoot. Paradoxically the worst area is actually on the right foot is extensive with purulent drainage. We will use silver alginate today 02/17/2018; the patient has 3 wounds one over the left lateral foot. She still has a small area over the metatarsal heads which is the remnant of her original surgical wound. This has medial probing depth of roughly 1.4 cm somewhat better than last week. The area on the right foot is larger. We have been using silver alginate to all areas. The area on the  right foot and  left foot that we cultured last week showed both Klebsiella and Proteus. Both of these are quinolone sensitive. The patient put her's self on Bactrim and Flagyl that she had left hanging around from prior antibiotic usages. She was apparently on this last week when she arrived. I did not realize this. Unfortunately the Bactrim will not cover either 1 of these organisms. We will send in Cipro 500 twice daily for a week 03/04/2018; the patient has 2 wounds on the left foot one is the original wound which was a surgical wound for a deep DFU. At one point this had exposed bone. She still has an area over the fourth metatarsal head that probes about 1.4 cm although I think this is better than last week. I been using silver nitrate to try and promote tissue adherence and been using silver alginate here. ooShe also has an area in the left midfoot. This has some depth but a small linear wound. Still requiring debridement. ooOn the right midfoot is a circular wound. A lot of thick callus around this area. ooWe have been using silver alginate to all wound areas ooShe is completed the ciprofloxacin I gave her 2 weeks ago. 03/11/2018; the patient continues to have 2 open areas on the left foot 1 of which was the original surgical wound for a deep DFU. Only a small probing area remains although this is not much different from last week we have been using silver alginate. The other area is on the midfoot this is smaller linear but still with some depth. We have been using silver alginate here as well ooOn the right foot she has a small circular wound in the mid aspect. This is not much smaller than last time. We have been using silver alginate here as well 03/18/2018; she has 3 wounds on the left foot the original surgical wound, a very superficial wound in the mid aspect and then finally the area in the mid plantar foot. She arrives in today with a very concerning area in the wound in the mid plantar foot  which is her most proximal wound. There is undermining here of roughly 1-1/2 cm superiorly. Serosanguineous drainage. She tells me she had some pain on for over the weekend that shot up her foot into her thigh and she tells me that she had a nodule in the groin area. ooShe has the single wound in the right foot. ooWe are using endoform to both wound areas 03/24/2018; the patient arrives with the original surgical wound in the area on the left midfoot about the same as last week. There is a collection of fluid under the surface of the skin extending from the surgical wound towards the midfoot although it does not reach the midfoot wound. The area on the right foot is about the same. Cultures from last week of the left midfoot wound showed abundant Klebsiella abundant Enterococcus faecalis and moderate methicillin resistant staph I gave her Levaquin but this would have only covered the Klebsiella. She will need linezolid 04/01/2018; she is taking linezolid but for the first few days only took 1 a day. I have advised her to finish this at twice daily dosing. In any case all of her wounds are a lot better especially on the left foot. The original surgical wound is closed. The area on the left midfoot considerably smaller. The area on the right foot also smaller. 04/08/2018; her original surgical wound/osteomyelitis on the left foot remains closed. She has  area on the left foot that is in the midfoot area but she had some streaking towards this. This is not connected with her original wound at least not visually. ooSmall wound on the right midfoot appears somewhat smaller. 04/15/18; both wounds looks better. Original wound is better left midfoot. Using silver alginate 1/21; patient states she uses saltwater soak in, stones or remove callus from around her wounds. She is also concerned about a blood blister she had on the left foot but it simply resolved on its own. We've been using silver  alginate 1/28; the patient arrives today with the same streaking area from her metatarsals laterally [the site of her original surgical wound] down to the middle of her foot. There is some drainage in the subcutaneous area here. This concerns me that there is actually continued ongoing infection in the metatarsals probably the fourth and third. This fixates an MRI of the foot without contrast [chronic renal failure] ooThe wound in the mid part of the foot is small but I wonder whether this area actually connects with the more distal foot. ooThe area on the right midfoot is probably about the same. Callus thick skin around the small wound which I removed with a curette we have been using silver alginate on both wound areas 2/4; culture I did of the draining site on the left foot last time grew methicillin sensitive staph aureus. MRI of the left foot showed interval resolution of the findings surrounding the third metatarsal joint on the prior study consistent with treated osteomyelitis. Chronic soft tissue ulceration in the plantar and lateral aspect of the forefoot without residual focal fluid collection. No evidence of recurrent osteomyelitis. 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 She tells me she had nausea and vomiting over the weekend and missed 2 days. In general her foot looks much the same. She has a small open area just below the left fourth metatarsal head. A linear area in the left midfoot. Some discoloration extending from the inferior part of this into the left lateral foot although this appears to be superficial. She has a small area on the right midfoot which generally looks smaller after debridement 2/18; the patient is completing his  cephalexin and has another 2 days. She continues to have open areas on the left and right foot. 2/25; she is now off antibiotics. The area on the left foot at the site of her original surgical wound has closed yet again. She still has open areas in the mid part of her foot however these appear smaller. The area on the right mid foot looks about the same. We have been using silver alginate She tells me she had a serious hypoglycemic spell at home. She had to have EMS called and get IV dextrose 3/3; disappointing on the left lateral foot large area of necrotic tissue surrounding the linear area. This appears to track up towards the same original surgical wound. Required extensive debridement. The area on the right plantar foot is not a lot better also using silver 3/12; the culture I did last time showed abundant enterococcus. I have prescribed Augmentin, should cover any unrecognized anaerobes as well. In addition there were a few MRSA and Serratia that would not be well covered although I  did not want to give her multiple antibiotics. She comes in today with a new wound in the right midfoot this is not connected with the original wound over her MTP a lot of thick callus tissue around both wounds but once again she said she is not walking on these areas 3/17-Patient comes in for follow-up on the bilateral plantar wounds, the right midfoot and the left plantar wound. Both these are heavily callused surrounding the wounds. We are continuing to use silver alginate, she is compliant with offloading and states she uses a wheelchair fairly often at home 3/24; both wound areas have thick callus. However things actually look quite a bit better here for the majority of her left foot and the right foot. 3/31; patient continues to have thick callused somewhat irritated looking tissue around the wounds which individually are fairly superficial. There is no evidence of surrounding infection. We have been using  silver alginate however I change that to [facility] today 4/17; patient returns to clinic after having a scare with Covid she tested negative in her primary doctor's office. She has been using Hydrofera Blue. She does not have an open area on the right foot. On the left foot she has a small open area with the mid area not completely viable. She showed me pictures of what looks like a hemorrhagic blister from several days ago but that seems to have healed over this was on the lateral left foot 4/21; patient comes in to clinic with both her wounds on her feet closed. However over the weekend she started having pain in her right foot and leg up into the thigh. She felt as though she was running a low-grade fever but did not take her temperature. She took a doxycycline that she had leftover and yesterday a single Septra and metronidazole. She thinks things feel somewhat better. 4/28; duplex ultrasound I ordered last week was negative for DVT or superficial thrombophlebitis. She is completed the doxycycline I gave her. States she is still having a lot of pain in the right calf and right ankle which is no better than last week. She cannot sleep. She also states she has a temperature of up to 101, coughing and complaining of visual loss in her bilateral eyes. Apparently she was tested for Covid 2 weeks ago at [facility] and that was negative. Readmission: 09/03/18 patient presents back for reevaluation after having been evaluated at the end of April regarding erythema and swelling of her right lower extremity. Subsequently she ended up going to the hospital on 07/29/18 and was admitted not to be discharged until 08/08/18. Unfortunately it was noted during the time that she was in the hospital that she did have methicillin-resistant Staphylococcus aureus as the infection noted at the site. It was also determined that she did have osteomyelitis which appears to be fairly significant. She was treated with vancomycin  and in fact is still on IV vancomycin at dialysis currently. This is actually slated to continue until 09/12/18 at least which will be the completion of the six weeks of therapy. Nonetheless based on what I'm seeing at this point I'm not sure she will be anywhere near ready to discontinue antibiotics at that time. Since she was released from the hospital she was seen by Dr. Amalia [name] who is her podiatrist on 08/27/18. His note specifically states that he is recommended that the patient needs of one knee amputation on the right as she has a life- threatening situation that can lead quickly  to sepsis. The patient advised she would like to try to save her leg to which Dr. Amalia [name] apparently told her that this was against all medical advice. She also want to discontinue the Wound VAC which had been initiated due to the fact that she wasn't pleased with how the wound was looking and subsequently she wanted to pursue applying Medihoney at that time. He stated that he did not believe that the right lower extremity was salvageable and that the patient understood but would still like to attempt hyperbaric option therapy if it could be of any benefit. She was therefore referred back to [place] for further evaluation. He plans to see her back next week. Upon inspection today patient has a significant amount purulent drainage noted from the wound at this point. The bone in the distal portion of her foot also appears to be extremely necrotic and spongy. When I push down on the bone it bubbles and seeps purulent drainage from deeper in the end of the foot. I do not think that this is likely going to heal very well at all and less aggressive surgical debridement were undertaken more than what I believe we can likely do here in our office. 09/12/2018; I have not seen this patient since the most recent hospitalization although she was in our clinic last week. I have reviewed some of her records from a complex hospitalization. She had  osteomyelitis of the right foot of multiple bones and underwent a surgical IandD. There is situation was complicated by MRSA bacteremia and acute on chronic renal failure now on dialysis. She is receiving vancomycin at dialysis. We started her on Dakin's wet-to-dry last week she is changing this daily. There is still purulent drainage coming out of her foot. Although she is apparently "agreeable" to a below-knee amputation which is been suggested by multiple clinicians she wants this to be done in [place]. She apparently has a telehealth visit with that provider sometime in late [place] 6/24. I have told her I think this is probably too long. Nevertheless I could not convince her to allow a local doctor to perform BKA. 09/19/2018; the patient has a large necrotic area on the right anterior foot. She has had previous transmetatarsal amputations. Culture I did last week showed MRSA nothing else she is on vancomycin at dialysis. She has continued leaking purulent drainage out of the distal part of the large circular wound on the right anterior foot. She apparently went to see Dr. 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 Somehow that translated into her being referred to plastic surgery for debridement of the area. I gather she basically refused amputation although I do not have a copy of Dr. Berenice Primas notes. The patient really wants to have a trial of hyperbaric oxygen. I agreed with initial assessment in this clinic that this was probably too far along to benefit however if she is going to have plastic surgery I think she would benefit from ancillary hyperbaric oxygen. The issue here is that the patient has benefited as maximally as any patient I have ever seen from hyperbaric oxygen therapy. Most recently she had exposed bone on the lateral part of her left foot after a surgical procedure and that actually has closed. She has eschared areas in both heels but no open area.  She is remained systemically well. I am not optimistic that anything can be done about this but the patient is very clear that she wants an attempt. The attempt would  include a wound VAC further debridements and hyperbaric oxygen along with IV antibiotics. 6/26; I put her in for a trial of hyperbaric oxygen only because of the dramatic response she has had with wounds on her left midfoot earlier this year which was a surgical wound that went straight to her bone over the metatarsal heads and also remotely the left third toe. We will see if we can get this through our review process and insurance. She arrives in clinic with again purulent material pouring out of necrotic bone on the top of the foot distally. There is also some concerning erythema on the front of the leg that we marked. It is bit difficult to tell how tender this is because of neuropathy. I note from infectious disease that she had her vancomycin extended. 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 She had the wound VAC on for part of the week. The rest of the time she is putting various things on this including Medihoney, "ionized water" silver sorb gel etc. 7/7; follow-up along with HBO. She is still on vancomycin at dialysis. She has a large open area on the dorsal right foot and a small dark eschar area on her heel. There is a lot less erythema in the area and a lot less tenderness. From an infection point of view I think this is better. She still has a lot of necrosis in the remaining right forefoot [previous TMA] we are still using the wound VAC in this area 7/16; follow-up along with HBO. I put her on linezolid after she finished her vancomycin. We started this last Friday I gave her 2 weeks worth. I had the expectation that she would be operatively debrided by Dr. Marla Roe but that still has not happened yet. Patient phoned the office this week. She arrives for review today after HBO. The distal  part of this wound is completely necrotic. Nonviable pieces of tendon bone was still purulent drainage. Also concerning that she has black eschar over the heel that is expanding. I think this may be indicative of infection in this area as well. She has less erythema and warmth in the ankle and calf but still an abnormal exam 7/21 follow-up along with HBO. I will renew her linezolid after checking a CBC with differential monitoring her blood counts especially her platelets. She was supposed to have surgery yesterday but if I am reading things correctly this was canceled after her blood sugar was found to be over 500. I thought Dr. Marla Roe who called me said that they were sending her to the ER but the patient states that was not the case. 7/28. Follow-up along with HBO. She is on linezolid I still do not have any lab work from dialysis even though I called last week. The patient is concerned about an area on her left lateral foot about the level of the base of her fifth metatarsal. I did not really see anything that ominous here however this patient is in [place] ability to point out problems that she is sensing and she has been accurate in the past Finally she received a call from Dr. Marla Roe who is referring her to another orthopedic surgeon stating that she is too booked up to take her to the operating room now. Was still using a wound VAC on the foot 8/3 -Follow-up after HBO, she is got another week of linezolid, she is to call [facility] for an appointment, x-rays of both feet were reviewed,  the left foot x-ray with third MTP joint osteo- Right foot x-ray widespread osteo-in the right midfoot Right ankle x-ray does not show any active evidence of infection 8/11-Patient is seen after HBO, the wounds on the right foot appear to be about the same, the heel wound had some necrotic base over tendon that was debrided with a curette 8/21; patient is seen after HBO. The patient's wound on her dorsal foot  actually looks reasonably good and there is substantial amount of epithelialization however the open area distally still has a lot of necrotic debris partially bone. I cannot really get a good sense of just how deep this probes under the foot. She has been pressuring me this week to order medical maggots through a company in [place] for her. The problem I have is there is not a defined wound area here. On the positive side there is no purulence. She has been to see infectious disease she is still on Septra DS although I have not had a chance to review their notes 8/28; patient is seen in conjunction with HBO. The wounds on her foot continued to improve including the right dorsal foot substantially the, the distal part of this wound and the area on the right heel. We have been using a wound VAC over this chronically. She is still on trimethoprim as directed by infectious disease 9/4; patient is seen in conjunction with HBO. Right dorsal foot wound substantially anteriorly is better however she continues to have a deep wound in the distal part of this that is not responding. We have been using silver collagen under border foam ooArea on the right plantar medial heel seems better. We have been using Hydrofera Blue 12/12/18 on evaluation today patient appears to be doing about the same with regard to her wound based on prior measurements. She does have some necrotic tissue noted on the lateral aspect of the wound that is going require a little bit of sharp debridement today. This includes what appears to be potentially either severely necrotic bone or tendon. Nonetheless other than that she does not appear to have any severe infection which is good news 9/18; it is been 2 weeks since I saw this wound. She is tolerating HBO well. Continued dramatic improvement in the area on the right dorsal foot. She still has a small wound on the heel that we have been using Hydrofera Blue. She continues with a wound  VAC 9/24; patient has to be seen emergently today with a swelling on her right lateral lower leg. She says that she told Dr. Evette Doffing about this and also myself on a couple of occasions but I really have no recollection of this. She is not systemically unwell and her wound really looked good the last time I saw this. She showed this to providers at dialysis and she was able to verify that she was started on cephalexin today for 5 doses at dialysis. She dialyzes on Tuesday Thursday and Saturday. 10/2; patient is seen in conjunction with HBO. The area that is draining on the right anterior medial tibia is more extensive. Copious amounts of serosanguineous drainage with some purulence. We are still using the wound VAC on the original wound then it is stable. Culture I did of the original IandD showed MRSA I contacted dialysis she is now on vancomycin with dialysis treatments. I asked them to run a month 10/9; patient seen in conjunction with HBO. She had a new spontaneous open area just above the wound on  the right medial tibia ankle. More swelling on the right medial tibia. Her wound on the foot looks about the same perhaps slightly better. There is no warmth spreading up her leg but no obvious erythema. her MRI of the foot and ankle and distal tib-fib is not booked for next Friday I discussed this with her in great detail over multiple days. it is likely she has spreading infection upper leg at least involving the distal 25% above the ankle. She knows that if I refer her to orthopedics for infectious disease they are going to recommend amputation and indeed I am not against this myself. We had a good trial at trying to heal the foot which is what she wanted along with antibiotics debridement and HBO however she clearly has spreading infection [probably staph aureus/MRSA]. Nevertheless she once again tells me she wants to wait the left of the MRI. She still makes comments about having her amputation  done in [place]. 10/19; arrives today with significant swelling on the lateral right leg. Last culture I did showed Klebsiella. Multidrug-resistant. Cipro was intermediate sensitivity and that is what I have her on pending her MRI which apparently is going to be done on Thursday this week although this seems to be moving back and forth. She is not systemically unwell. We are using silver alginate on her major wound area on the right medial foot and the draining areas on the right lateral lower leg 10/26; MRI showed extensive abscess in the anterior compartment of the right leg also widespread osteomyelitis involving osseous structures of the midfoot and portions of the hindfoot. Also suspicion for osteomyelitis anterior aspect of the distal medial malleolus. Culture I did of the purulence once again showed a multidrug-resistant Klebsiella. I have been in contact with nephrology late last week and she has been started on cefepime at dialysis to replace the vancomycin We sent a copy of her MRI report to Dr. Geroge Baseman in [place] who is an orthopedic surgeon. The patient takes great stock in his opinion on this. She says she will go to [place] to have her leg amputated if Dr. Geroge Baseman does not feel there is any salvage options. 11/2; she still is not talk to her orthopedic surgeon in [place]. Apparently he will call her at [phone] this afternoon. The quality of this is she has not allowed me to refer her anywhere. She has been told over and over that she needs this amputated but has not agreed to be referred. She tells me her blood sugar was 600 last night but she has not been febrile. 11/9; she never did got a call from the orthopedic surgeon in [place] therefore that is off the radar. We have arranged to get her see orthopedic surgery at [facility]. She still has a lot of draining purulence coming out of the new abscess in her right leg although that probably came from the osteomyelitis in her  right foot and heel. Meanwhile the original wound on the right foot looks very healthy. Continued improvement. The issue is that the last MRI showed osteomyelitis in her right foot extensively she now has an abscess in the right anterior lower leg. There is nobody in [place] who will offer this woman anything but an amputation and to be honest that is probably what she needs. I think she still wants to talk about limb salvage although at this point I just do not see that. She has completed her vancomycin at dialysis which was for the original staph aureus  she is still on cefepime for the more recent Klebsiella. She has had a long course of both of these antibiotics which should have benefited the osteomyelitis on the right foot as well as the abscess. 11/16; apparently [place] elective surgery is shut down because of COVID-19 pandemic. I have reached out to some contacts at [facility] to see if we can get her an orthopedic appointment there. I am concerned about continually leaving this but for the moment everything is static. In fact her original large wound on this foot is closing down. It is the abscess on the right anterior leg that continues to drain purulent serosanguineous material. She is not currently on any antibiotics however she had a prolonged course of vancomycin [1 month] as well as cefepime for a month 02/24/2019 on evaluation today patient appears to be doing better than the last time I saw her. This is not a patient that I typically see. With that being said I am covering for Dr. Dellia Nims this week and again compared to when I last saw her overall the wounds in particular seem to be doing significantly better which is good news. With that being said the patient tells me several disconcerting things. She has not been able to get in to see anyone for potential debridement in regard to her leg wounds although she tells me that she does not think it is necessary any longer because she  is taking care of that herself. She noticed a string coming out of the lower wound on her leg over the last week. The patient states that she subsequently decided that we must of pack something in there and started pulling the string out and as it kept coming and coming she realized this was likely her tendon. With that being said she continued to remove as much of this as she could. She then I subsequently proceeded to using tubes of antibiotic ointment which she will stick down into the wound and then scored as much as she can until she sees it coming out of the other wound opening. She states that in doing this she is actually made things better and there is less redness and irritation. With regard to her foot wound she does have some necrotic tendon and tissue noted in one small corner but again the actual wound itself seems to be doing better with good granulation in general compared to my last evaluation. 12/7; continued improvement in the wound on the substantial part of the right medial foot. Still a necrotic area inferiorly that required debridement but the rest of this looks very healthy and is contracting. She has 2 wounds on the right lateral leg which were her original drainage sites from her abscess but all of this looks a lot better as well. She has been using silver alginate after putting antibiotic biotic ointment in one wound and watching it come out the other. I have talked to her in some detail today. I had given her names of orthopedic surgeons at [facility] for second opinion on what to do about the right leg. I do not think the patient never called them. She has not been able to get a hold of the orthopedic surgeon in [place] that she had put a lot of faith in as being somebody would give her an opinion that she would trust. I talked to her today and said even if I could get her in to another orthopedic surgeon about the leg which she accept an amputation and she  said she would  not therefore I am not going to press this issue for the moment 12/14; continued improvement in his substantial wound on the right medial foot. There is still a necrotic area inferiorly with tightly adherent necrotic debris which I have been working on debriding each time she is here. She does not have an orthopedic appointment. Since last time she was here I looked over her cultures which were essentially MRSA on the foot wound and gram-negative rods in the abscess on the anterior leg. 12/21; continued improvement in the area on the right medial foot. She is not up on this much and that is probably a good thing since I do not know it could support continuous ambulation. She has a small area on the right lateral leg which were remanence of the IandD's I did because of the abscess. I think she should probably have prophylactic antibiotics I am going to have to look this over to see if we can make an intelligent decision here. In the meantime her major wound is come down nicely. Necrotic area inferiorly is still there but looks a lot better 04/06/2019; she has had some improvement in the overall surface area on the right medial foot somewhat narrowedr both but somewhat longer. The areas on the right lateral leg which were initial IandD sites are superficial. Nothing is present on the right heel. We are using silver alginate to the wound areas 1/18; right medial foot somewhat smaller. Still a deep probing area in the most distal recess of the wound. She has nothing open on the right leg. She has a new wound on the plantar aspect of her left fourth toe which may have come from just pulling skin. The patient using Medihoney on the wound on her foot under silver alginate. I cannot discourage her from this 2/1; 2-week follow-up using silver alginate on the right foot and her left fourth toe. The area on the right dorsal foot is contracted although there is still the deep area in the most distal part of the  wound but still has some probing depth. No overt infection 2/15; 2-week follow-up. She continues to have improvement in the surface area on the dorsal right foot. Even the tunneling area from last time is almost closed. The area that was on the plantar part of her left fourth toe over the PIP is indeed closed 3/1; 2-week follow-up. Continued improvement in surface area. The original divot that we have been debriding inferiorly I think has full epithelialization although the epithelialization is gone down into the wound with probably 4 mm of depth. Even under intense illumination I am unable to see anything open here. The remanence of the wound in this area actually look quite healthy. We have been using silver alginate 3/15; 2-week follow-up. Unfortunately not as good today. She has a comma shaped wound on the dorsal foot however the upper part of this is larger. Under illumination debris on the surface She also tells [place] that she was on her right leg 2 times in the last couple of weeks mostly to reach up for things above her head etc. She felt a sharp pain in the right leg which she thinks is somewhere from the ankle to the knee. The patient has neuropathy and is really uncertain. She cannot feel her foot so she does not think it was coming from there 3/29; 2-week follow-up. Her wound measures smaller. Surface of the wound appears reasonable. She is using silver alginate with underlying  Medihoney. She has home health. X-rays I did of her tib-fib last time were negative although it did show arterial calcification 4/12; 2-week follow-up. Her wound measures smaller in length. Using manuka honey with silver alginate on top. She has home health. 4/26; 2-week follow-up. Her wound is smaller but still very adherent debris under illumination requiring debridement she has been using manuka honey with silver alginate. She has home health 08/28/19-Wound has about the same size, but with a layer of eschar at the  lateral edge of the amputation site on the right foot. Been using Hydrofera Blue. She is on suppressive Bactrim but apparently she has been taking it twice daily 6/7; I have not seen this wound and about 6 weeks. Since then she was up in [place]. By her own admission she was walking on the foot because she did not have a wheelchair. The wound is not nearly as healthy looking as it was the last time I saw this. We ordered different things for her but she only uses Medihoney and silver alginate. As far as I know she is on suppressive trimethoprim sulfamethoxazole. She does not admit to any fever or chills. Her CBGs apparently are at baseline however she is saying that she feels some discomfort on the lateral part of her ankle I looked over her last inflammatory markers from the summer 2020 at which time she had a deeply necrotic infected wound in this area. On 11/10/2018 her sedimentation rate was 56 and C-reactive protein 9.9. This was 107 and 29 on 07/29/2018. 6/17; the patient had a necrotic wound the last time she was here on the right dorsal foot. After debridement I did a culture. This showed a very resistant ESBL Klebsiella as well as Enterococcus. Her x-ray of the foot which was done because of warmth and some discomfort showed bone destruction within the carpal bones involving the navicular acute cuboid lateral middle cuneiforms but essentially unchanged from her prior study which was done on 10/29/2018. The findings were felt to represent chronic osteomyelitis. We did inflammatory markers on her. Her white count was 5.25 sedimentation rate 16 and C-reactive protein at 11.1. Notable for the fact that in August 2020 her CRP was 9.9 and sedimentation rate 56. I have looked at her x-rays. It is true that the bone destruction is very impressive however the patient came into this clinic for the wound on her right foot with pieces of bone literally falling out anteriorly with purulent material. I am not  exactly sure I could have expected anything different. She has not been systemically unwell no fever chills or blood sugars have been reasonable. 6/28; she arrives with a right heel closed. The substantial area on the right anterior foot looks healthy. Much better looking surface. I think we can change to [facility] seems to help this previously. She is getting her antibiotics at dialysis she should be just about finished 7/9; changed to [facility] last week. Surface wound looks satisfactory not much change in surface area however. She is going to [place] state next week this is usually a difficult thing for this patient follow-up will be for 2 weeks. 7/23; using Hydrofera Blue. She returns from her trip and the wound looks surprisingly good. Usually when this patient goes on trips she comes back with a lot of problems with the wound. She is saying that she sometimes feels an episodic "crunching" feeling on the lateral part of the foot. She is neuropathic and not feeling pain but wonders  whether this could be a neuropathic dysesthesia. 11/13/19-Patient returns after 3 weeks, the wound itself is stable and patient states that there is nothing new going on she is on some extra anxiety medications and is resisting the temptation to pick at the dry skin around the wound. 9/20; patient has not been here in over a month and I have not seen her in 2 months. The wound in terms of size I think is about the same. There is no exposed bone. She has a nonviable surface on this. She is supposed to be using [facility] however she is also been using some form of honey preparation as well as a silver-based dressings. I do not think she has any pattern to this. 10/4; 2-week follow-up. Patient has been using some form of spray which she says has honey and silver to purchase this online she has been covering it with gauze. In spite of this the wound actually looks quite good. The deeper divot distally  appears to be close down. There is a rim of epithelialization. 10/18; 2-week follow-up. Patient has been using her Hydrofera Blue covered with her silver honey spray that she got online. 11/1; 2-week follow-up. She is using Hydrofera Blue with a silver honey spray. Wound bed is measuring smaller. She has noticed that her foot is warmer on the right. She is concerned about infection. For a long period of time I had her on prophylactic trimethoprim sulfamethoxazole DS 1 tablet daily. She is asking for this to be restarted. The patient is walking on this foot because of repairs that are being done in a home her but her room is on the second floor she has to go up and down stairs. I have cautioned against this however as usual she will do exactly what she wants to do 11/15; 2-week follow-up. She uses Hydrofera Blue with a silver/honey spray which I have never heard of. I think her wound looks about the same. Some epithelialization. No evidence that this is infected. I think she is walking on this more than we agreed on. She is going on extensive vacation over Thanksgiving 12/3; 2-week follow-up. She is using Hydrofera Blue however over Thanksgiving she ran out of this and she is simply been using Medihoney. In spite of this her wound is smaller almost divided into 2 now. She traveled extensively over Thanksgiving and actually looks quite good in spite of this. Usually this is been a marker of problems for her 12/17; 2-week follow-up. She is using Hydrofera Blue. The wound is smaller. Debris on the surface of this is fibrinous. She is traveling to [place] over the holidays which never bodes well for her wounds. I think she is walking more on her feet then she is even willing to admit and she tells me she does walk 2/11; using Hydrofera Blue. Her wounds are contracting however she walks in the clinic with a history that she has not been able to eat she has had vomiting. She also has right eye problems. Her  blood sugar was 567. She had blood work done at dialysis and we called there to get her blood work although it still had not returned although they should have this by the end of the day we were told. She also stated that she was not sure what her blood sugar was as her glucose monitor was not working and not coming to tomorrow. She also for some reason does not think her insulin pump is working well. Her endocrinologist  is Dr. Elayne Snare 06/03/2020 upon evaluation today patient actually appears to be doing excellent in regard to her wound. In fact she tells me it was not even bleeding until she picked a dry piece of skin off while we were getting ready to come in and see her today. Fortunately there is no signs of active infection which is great news and overall very pleased. She is going to be seeing her neurologist sometime shortly I believe it might even be on Monday. Nonetheless there can proceed with the work-up as far as anything else going on with her eye the fact that her right eyelid is drooping. 3/25; right medial dorsal foot. She has superficial areas here that appear to be fully epithelialized she is using Medihoney and silver alginate and some combination. ooShe has a new area on the mid left foot at roughly the level of the fifth metatarsal base 4/84/8; right medial dorsal foot. Still superficial areas here. I cleaned up 1 of these today she has been using I think mostly Medihoney but I would like her to use silver alginate. The area on the mid part of her foot also looks improved on the left. Since she was last here she tells me that she noted pus in the area of her left foot. She told dialysis about this they did a culture and ultimately she is on IV antibiotics but were not sure which one. They referred her to Dr. Amalia [name] he said that he did not need to follow her. I have not really verified any of this and I do not know what antibiotic she is on at dialysis 4/29; patient has been  using Medihoney to her wounds. She reports taking off the toenail to her left second toe. She states that home health discharged her and she has not had help with dressing changes. She denies any signs and symptoms of infection. 5/13; 2-week follow-up. Miraculously the wound on the right foot dorsally is healed. She has an area on the tip of her left third toe and an area in the left midfoot. 5/26; patient presents for 2-week follow-up. She has 2 open wounds 1 to her plantar foot and the other to the left third toe. She has noticed increased swelling and redness to the toe wound. It is unclear exactly what she is doing for dressing changes as she has multiple dressing options at home. She states that it is easiest to do [facility] on the plantar wound and Prisma or Medihoney on the toe wound. 6/3; saw Dr. Maryjane Hurter week. She put her on Keflex because of the swelling I think of the left second toe. She arrives today with new breakdown x3 on the dorsal right foot which is disappointing. She also complains of pain in the right ankle, 3 liquid stools per day, chills without fever 6/17; patient was in [place]. I think a little more ambulatory than I would like to hear. In any case she still has the 3 small open areas on the dorsal part of her foot on the right medial leg these look about the same as last time. She is supposed to be using Prisma but I think used Medihoney. She has an area on the left lateral plantar foot which had undermining under her skin and a blood blister with the 2 areas communicating today. And finally an area on the tip of her left second toe 6/30; patient has 3 small areas that look better than the last time on the right  dorsal foot. She uses a combination of her products including collagen, Hydrofera Blue and the Medihoney nevertheless they look better. The area on the left midfoot might have been healed although she pulled some skin off superficial open area. The left second toe  still does not look that healthy. Open areas on the tips of the 7/21; the areas on her right dorsal foot are still present although they look benign. We are using silver collagen although she may be using any of the products she had at home. The real problem here is the left second toe. Necrotic wound over the distal phalanx and distal interphalangeal joint. This probes directly to bone. She arrives in clinic today with an interesting story. She went to [place] last week. She became ill there with refractory nausea and vomiting. She went to an urgent care and then the ER. I am able to see parts of this in Care Everywhere. She had 2 blood cultures done both of which ultimately showed MRSA. This was sensitive to daptomycin and linezolid and vancomycin. Her white count was 4.8 hemoglobin 10.4. X-ray of the left foot showed prior resection of the left fifth metatarsal no evidence of osteomyelitis. Her C-reactive protein on 10/13/2020 was 104.8. She refused admission to the hospital as she was to board her flight to return to [place]. She dialyzed this morning I am not certain why she did not bring this to their attention 7/27; fortunately the left second toe and the erythema in her left foot looks a lot better on the IV vancomycin at dialysis. I was able to verify earlier this week that she had indeed received it on the weekend starting last Saturday. She will need at least 4 weeks. I saw her left arm shunt and it looks fine. We are using silver alginate on the toe and we have been using I think Medihoney on the right foot which is actually been her decision. She has 2 open wounds dorsally. She does not describe fever chills her blood sugars are erratic but seem to be at her baseline 8/18; she has completed her antibiotics at dialysis but is unsure how long this was ordered. I have asked her to check when she goes to dialysis on Saturday. She is using polymen on the right foot dorsally and silver  collagen not silver alginate to the left second toe. Her blood pressure is high today but blood sugars have been stable according to her she is having no fever or systemic symptoms 9/14; about a month since we have seen her. In the interim she was admitted to hospital from 11/21/2020 through 12/01/2020 with delirium secondary to sepsis. She was felt to have sepsis from a left second toe MRSA infection. She is on IV vancomycin and I think will complete 6 weeks of this on 12/29/2020. Her blood cultures were negative. A transthoracic echo did not show any vegetations. I do not believe she had a TEE. She comes in today with a sizable wound on the right medial foot with some necrotic surface in the mid part of this and also a wound on the tip of her left second toe. She has been using Medihoney 10/18; I have not seen this woman in about a month. In the meantime her left second toe seems to have healed over although likely that there has been bone or joint destruction in the distal part of the second toe. The area on the right lateral foot is worse. Our intake nurses noted green  drainage. Not completely clear as usual what she is using here. She tells me that she did see ground-up some antibiotics likely cephalexin although I am not sure about this and she took 2 days worth. She had called some weeks ago about the amount of antibiotics she got for her MRSA sepsis. We referred her back to dialysis but she claims that she really only got a week of therapy. I find that hard to believe 11/15; 1 month follow-up. She comes in with a small wound on the right dorsal foot better than last time. As usual she is using some combination of Medihoney and silver alginate. She was traveling in [facility] but fortunately this time when she traveled to the wounds do not seem to have suffered. There is nothing open on the left foot Objective Constitutional Patient is hypertensive.. Pulse regular and within target range for  patient.Marland Kitchen Respirations regular, non-labored and within target range.. Temperature is normal and within the target range for the patient.Marland Kitchen Appears in no distress. Vitals Time Taken: 1:07 PM, Height: 67 in, Weight: 125 lbs, BMI: 19.6, Temperature: 98.5 F, Pulse: 77 bpm, Respiratory Rate: 18 breaths/min, Blood Pressure: 185/91 mmHg, Capillary Blood Glucose: 173 mg/dl. General Notes: Wound exam; smaller area on the right dorsal foot. Under illumination not a very viable surface however it cleans up quite nicely after debridement with a #3 curette subcutaneous tissue removed. No evidence of surrounding infection Integumentary (Hair, Skin) Wound #52 status is Open. Original cause of wound was Gradually Appeared. The date acquired was: 09/02/2020. The wound has been in treatment 23 weeks. The wound is located on the Right Foot. The wound measures 1cm length x 3cm width x 0.1cm depth; 2.356cm^2 area and 0.236cm^3 volume. There is Fat Layer (Subcutaneous Tissue) exposed. There is no tunneling or undermining noted. There is a medium amount of serosanguineous drainage noted. The wound margin is distinct with the outline attached to the wound base. There is large (67-100%) red, pink granulation within the wound bed. There is a small (1-33%) amount of necrotic tissue within the wound bed including Adherent Slough. 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 There was a Excisional Skin/Subcutaneous Tissue Debridement with a total area of 3 sq cm performed by [name]., MD.  With the following instrument(s): Curette to remove Non-Viable tissue/material. Material removed includes Subcutaneous Tissue and Slough and after achieving pain control using Other (Benzocaine). No specimens were taken. A time out was conducted at 11:31, prior to the start of the procedure. A Moderate amount of bleeding was controlled with Silver Nitrate. The procedure was tolerated well. Post Debridement Measurements: 1cm length x 3cm width x 0.1cm depth; 0.236cm^3 volume. Character of Wound/Ulcer Post Debridement is stable. Severity of Tissue Post Debridement is: Fat layer exposed. Post procedure Diagnosis Wound #52: Same as Pre-Procedure Plan Follow-up Appointments: Return appointment in 3 weeks. - Dr. Dellia Nims Bathing/ Shower/ Hygiene: May shower and wash wound with soap and water. Edema Control - Lymphedema / SCD / Other: Elevate legs to the level of the heart or above for 30 minutes daily and/or when sitting, a frequency of: -  throughout the day Avoid standing for long periods of time. Moisturize legs daily. - with dressing changes Off-Loading: Open toe surgical shoe to: - to both feet Other: - minimal weight bearing right foot WOUND #52: - Foot Wound Laterality: Right Cleanser: Soap and Water Every Other Day/30 Days Discharge Instructions: May shower and wash wound with dial antibacterial soap and water prior to dressing change. Cleanser: Wound Cleanser (Generic) Every Other Day/30 Days Discharge Instructions: Cleanse the wound with wound cleanser prior to applying a clean dressing using gauze sponges, not tissue or cotton balls. Prim Dressing: MediHoney Gel, tube 1.5 (oz) Every Other Day/30 Days ary Discharge Instructions: Apply to wound bed as instructed Secondary Dressing: Woven Gauze Sponge, Non-Sterile 4x4 in (DME) (Generic) Every Other Day/30 Days Discharge Instructions: Apply over primary dressing as directed. Secondary Dressing: ABD Pad, 5x9 (DME) (Generic) Every Other  Day/30 Days Discharge Instructions: Apply over primary dressing as directed. Secured With: Elastic Bandage 4 inch (ACE bandage) (DME) (Generic) Every Other Day/30 Days Discharge Instructions: Secure with ACE bandage as directed. Secured With: [employer], 4.5x3.1 (in/yd) (DME) (Generic) Every Other Day/30 Days Discharge Instructions: Secure with Kerlix as directed. Secured With: Paper T ape, 1x10 (in/yd) (DME) (Generic) Every Other Day/30 Days Discharge Instructions: Secure dressing with tape as directed. #1 I actually think the Medihoney is a better choice right now to aid with some debridement. 2. The wound is smaller surface generally look better after debridement today. 3. Follow-up in 3 weeks Electronic Signature(s) Signed: 02/14/2021 4:37:23 PM By: Nancy Ham MD Entered By: [name] on 02/14/2021 13:50:37 -------------------------------------------------------------------------------- SuperBill Details Patient Name: Date of Service: Nancy LLO [name] 02/14/2021 Medical Record Number: [id number] Patient Account Number: [id number] Date of Birth/Sex: Treating RN: 1970-08-09 (50 y.o. 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 M86.172 Other acute osteomyelitis, left ankle and foot Facility Procedures CPT4 Code: [id number] Description: 74142 - DEB SUBQ TISSUE 20 SQ CM/< ICD-10 Diagnosis Description L97.514 Non-pressure chronic ulcer of other part of right foot with necrosis of bone Modifier: Quantity: 1 Physician Procedures : CPT4 Code Description Modifier  [id number] - WC PHYS SUBQ TISS 20 SQ CM ICD-10 Diagnosis Description L97.514 Non-pressure chronic ulcer of other part of right foot with necrosis of bone Quantity: 1 Electronic Signature(s) Signed: 02/14/2021 4:37:23 PM By: Nancy Ham MD Entered By: [name] on 02/14/2021 13:50:53

## 2021-02-16 NOTE — Progress Notes (Signed)
[name], [name] ([id number]) Visit Report for 02/14/2021 Arrival Information Details Patient Name: Date of Service: [name] 02/14/2021 1:00 PM Medical Record Number: [id number] Patient Account Number: [id number] Date of Birth/Sex: Treating RN: 1970/07/31 (50 y.o. [name] Primary Care Xitlali Kastens: [name], [name] Other Clinician: Referring Enid Maultsby: Treating Anie Juniel/Extender: [name], [name] Weeks in Treatment: 127 Visit Information History Since Last Visit Added or deleted any medications: No Patient Arrived: Wheel Chair Any new allergies or adverse reactions: No Arrival Time: 13:02 Had a fall or experienced change in No Transfer Assistance: None activities of daily living that may affect Patient Identification Verified: Yes risk of falls: Secondary Verification Process Completed: Yes Signs or symptoms of abuse/neglect since No Patient Requires Transmission-Based Precautions: No last visito Patient Has Alerts: No Hospitalized since last visit: No Implantable device outside of the clinic No excluding cellular tissue based products placed in the center since last visit: Has Dressing in Place as Prescribed: Yes Has Footwear/Offloading in Place as Yes Prescribed: Left: Surgical Shoe with Pressure Relief Insole Pain Present Now: No Electronic Signature(s) Signed: 02/14/2021 4:38:08 PM By: Lorrin Jackson Entered By: Lorrin Jackson on 02/14/2021 13:06:17 -------------------------------------------------------------------------------- Encounter Discharge Information Details Patient Name: Date of Service: [place] Y, [name] 02/14/2021 1:00 PM Medical Record Number: [id number] Patient Account Number: [id number] Date of Birth/Sex: Treating RN: 1970-11-12 (50 y.o. [name] Primary Care Meenakshi Sazama: [name], [name] Other Clinician: Referring Ruble Pumphrey: Treating Khing Belcher/Extender: [name], [name] Weeks in  Treatment: 127 Encounter Discharge Information Items Post Procedure Vitals Discharge Condition: Stable Temperature (F): 98.5 Ambulatory Status: Wheelchair Pulse (bpm): 77 Discharge Destination: Home Respiratory Rate (breaths/min): 18 Transportation: Other Blood Pressure (mmHg): 185/91 Schedule Follow-up Appointment: Yes Clinical Summary of Care: Provided on 02/14/2021 Form Type Recipient Paper Patient Patient Electronic Signature(s) Signed: 02/14/2021 4:38:08 PM By: Lorrin Jackson Entered By: Lorrin Jackson on 02/14/2021 13:50:17 -------------------------------------------------------------------------------- Lower Extremity Assessment Details Patient Name: Date of Service: [name] 02/14/2021 1:00 PM Medical Record Number: [id number] Patient Account Number: [id number] Date of Birth/Sex: Treating RN: [birth year] (50 y.o. [name] Primary Care Nicolaas Savo: [name], [name] Other Clinician: Referring Lexianna Weinrich: Treating Raesha Coonrod/Extender: [name], [name] Weeks in Treatment: 127 Edema Assessment Assessed: [Left: No] [Right: Yes] Edema: [Left: N] [Right: o] Calf Left: Right: Point of Measurement: 30 cm From Medial Instep 28 cm Ankle Left: Right: Point of Measurement: 9 cm From Medial Instep 19.5 cm Vascular Assessment Pulses: Dorsalis Pedis Palpable: [Right:Yes] Electronic Signature(s) Signed: 02/14/2021 4:38:08 PM By: Lorrin Jackson Entered By: Lorrin Jackson on 02/14/2021 13:09:50 -------------------------------------------------------------------------------- Multi Wound Chart Details Patient Name: Date of Service: [name] 02/14/2021 1:00 PM Medical Record Number: [id number] Patient Account Number: [id number] Date of Birth/Sex: Treating RN: 1970/11/22 (50 y.o. [name], [name] Primary Care Jozelynn Danielson: [name], [name] Other Clinician: Referring Dragan Tamburrino: Treating Syrus Nakama/Extender: [name], [name] Weeks in Treatment: 127 Vital Signs Height(in): 67 Capillary Blood Glucose(mg/dl): 173 Weight(lbs): 125 Pulse(bpm): 4 Body Mass Index(BMI): 20 Blood Pressure(mmHg): 185/91 Temperature(F): 98.5 Respiratory Rate(breaths/min): 18 Photos: [52:Right Foot] [N/A:N/A N/A] Wound Location: [52:Gradually Appeared] [N/A:N/A] Wounding Event: [52:Diabetic Wound/Ulcer of the Lower] [N/A:N/A] Primary Etiology: [52:Extremity Cataracts, Chronic sinus] [N/A:N/A] Comorbid History: [52:problems/congestion, Anemia, Sleep Apnea, Deep Vein Thrombosis, Hypertension, Peripheral Arterial Disease, Type I Diabetes, Osteoarthritis, Osteomyelitis, Neuropathy, Seizure Disorder 09/02/2020] [N/A:N/A] Date Acquired: [52:23] [N/A:N/A] Weeks of Treatment: [  52:Open] [N/A:N/A] Wound Status: [52:Yes] [N/A:N/A] Clustered Wound: [52:2] [N/A:N/A] Clustered Quantity: [52:1x3x0.1] [N/A:N/A] Measurements L x W x D (cm) [52:2.356] [N/A:N/A] A (cm) : rea [52:0.236] [N/A:N/A] Volume (cm) : [52:24.20%] [N/A:N/A] % Reduction in A [52:rea: 62.10%] [N/A:N/A] % Reduction in Volume: [52:Grade 2] [N/A:N/A] Classification: [52:Medium] [N/A:N/A] Exudate A mount: [52:Serosanguineous] [N/A:N/A] Exudate Type: [52:red, brown] [N/A:N/A] Exudate Color: [52:Distinct, outline attached] [N/A:N/A] Wound Margin: [52:Large (67-100%)] [N/A:N/A] Granulation A mount: [52:Red, Pink] [N/A:N/A] Granulation Quality: [52:Small (1-33%)] [N/A:N/A] Necrotic A mount: [52:Fat Layer (Subcutaneous Tissue): Yes N/A] Exposed Structures: [52:Fascia: No Tendon: No Muscle: No Joint: No Bone: No Small (1-33%)] [N/A:N/A] Epithelialization: [52:Debridement - Excisional] [N/A:N/A] Debridement: Pre-procedure Verification/Time Out 11:31 [N/A:N/A] Taken: [52:Other] [N/A:N/A] Pain Control: [52:Subcutaneous, Slough] [N/A:N/A] Tissue Debrided: [52:Skin/Subcutaneous Tissue] [N/A:N/A] Level: [52:3] [N/A:N/A] Debridement A (sq cm): [52:rea Curette]  [N/A:N/A] Instrument: [52:Moderate] [N/A:N/A] Bleeding: [52:Silver Nitrate] [N/A:N/A] Hemostasis A chieved: [52:Procedure was tolerated well] [N/A:N/A] Debridement Treatment Response: [52:1x3x0.1] [N/A:N/A] Post Debridement Measurements L x W x D (cm) [52:0.236] [N/A:N/A] Post Debridement Volume: (cm) [52:Debridement] [N/A:N/A] Treatment Notes Electronic Signature(s) Signed: 02/14/2021 4:37:23 PM By: Linton Ham MD Signed: 02/16/2021 5:03:29 PM By: Rhae Hammock RN Entered By: Linton Ham on 02/14/2021 13:46:49 -------------------------------------------------------------------------------- Multi-Disciplinary Care Plan Details Patient Name: Date of Service: [place] L[name] 02/14/2021 1:00 PM Medical Record Number: [id number] Patient Account Number: [id number] Date of Birth/Sex: Treating RN: Dec 20, 1970 (50 y.o. [name] Primary Care Deyana Wnuk: [name], [name] Other Clinician: Referring Patches Mcdonnell: Treating Marlei Glomski/Extender: [name], [name] Weeks in Treatment: Carn[place] reviewed with physician Active Inactive Nutrition Nursing Diagnoses: Imbalanced nutrition Impaired glucose control: actual or potential Potential for alteratiion in Nutrition/Potential for imbalanced nutrition Goals: Patient/caregiver agrees to and verbalizes understanding of need to use nutritional supplements and/or vitamins as prescribed Date Initiated: 09/03/2018 Date Inactivated: 10/07/2018 Target Resolution Date: 10/01/2018 Goal Status: Met Patient/caregiver will maintain therapeutic glucose control Date Initiated: 09/03/2018 Target Resolution Date: 03/14/2021 Goal Status: Active Interventions: Provide education on elevated blood sugars and impact on wound healing Treatment Activities: Patient referred to Primary Care Physician for further nutritional evaluation : 09/03/2018 Notes: 07/29/20: Glucose control ongoing, target date  extended. Wound/Skin Impairment Nursing Diagnoses: Impaired tissue integrity Knowledge deficit related to ulceration/compromised skin integrity Goals: Patient/caregiver will verbalize understanding of skin care regimen Date Initiated: 09/03/2018 Target Resolution Date: 03/14/2021 Goal Status: Active Ulcer/skin breakdown will have a volume reduction of 30% by week 4 Date Initiated: 09/03/2018 Date Inactivated: 10/07/2018 Target Resolution Date: 10/01/2018 Goal Status: Unmet Unmet Reason: COMORBITIES Ulcer/skin breakdown will have a volume reduction of 50% by week 8 Date Initiated: 10/07/2018 Date Inactivated: 11/03/2018 Target Resolution Date: 10/31/2018 Goal Status: Unmet Unmet Reason: Osteomyelitis Interventions: Assess patient/caregiver ability to obtain necessary supplies Assess patient/caregiver ability to perform ulcer/skin care regimen upon admission and as needed Assess ulceration(s) every visit Provide education on ulcer and skin care Treatment Activities: Skin care regimen initiated : 09/03/2018 Topical wound management initiated : 09/03/2018 Notes: Electronic Signature(s) Signed: 02/14/2021 4:38:08 PM By: Lorrin Jackson Entered By: Lorrin Jackson on 02/14/2021 13:02:08 -------------------------------------------------------------------------------- Pain Assessment Details Patient Name: Date of Service: [name] 02/14/2021 1:00 PM Medical Record Number: [id number] Patient Account Number: [id number] Date of Birth/Sex: Treating RN: 24-Mar-1971 (50 y.o. [name] Primary Care Audreanna Torrisi: [name], [name] Other Clinician: Referring Dyana Magner: Treating Symone Cornman/Extender: [name], [name] Weeks in Treatment: 127 Active Problems Location of Pain Severity and Description of Pain Patient  Has Paino No Site Locations Pain Management and Medication Current Pain Management: Electronic Signature(s) Signed: 02/14/2021 4:38:08 PM By: Lorrin Jackson Entered By: Lorrin Jackson on 02/14/2021 13:08:37 -------------------------------------------------------------------------------- Patient/Caregiver Education Details Patient Name: Date of Service: [address] [name] 11/15/2022andnbsp1:00 PM Medical Record Number: [id number] Patient Account Number: [id number] Date of Birth/Gender: Treating RN: 1970-06-04 (50 y.o. [name] Primary Care Physician: [name], [name] Other Clinician: Referring Physician: Treating Physician/Extender: [name], [name] Weeks in Treatment: 127 Education Assessment Education Provided To: Patient Education Topics Provided Elevated Blood Sugar/ Impact on Healing: Methods: Explain/Verbal Responses: State content correctly Wound/Skin Impairment: Methods: Explain/Verbal, Printed Responses: State content correctly Electronic Signature(s) Signed: 02/14/2021 4:38:08 PM By: Lorrin Jackson Entered By: Lorrin Jackson on 02/14/2021 13:02:32 -------------------------------------------------------------------------------- Wound Assessment Details Patient Name: Date of Service: [name] 02/14/2021 1:00 PM Medical Record Number: [id number] Patient Account Number: [id number] Date of Birth/Sex: Treating RN: [birth year] (50 y.o. [name] Primary Care Osborn Pullin: [name], [name] Other Clinician: Referring Svara Twyman: Treating Jarquis Walker/Extender: [name], [name] Weeks in Treatment: 127 Wound Status Wound Number: 52 Primary Diabetic Wound/Ulcer of the Lower Extremity Etiology: Wound Location: Right Foot Wound Open Wounding Event: Gradually Appeared Status: Date Acquired: 09/02/2020 Comorbid Cataracts, Chronic sinus problems/congestion, Anemia, Sleep Weeks Of Treatment: 23 History: Apnea, Deep Vein Thrombosis, Hypertension, Peripheral Arterial Clustered Wound: Yes Disease, Type I Diabetes, Osteoarthritis, Osteomyelitis, Neuropathy, Seizure  Disorder Photos Wound Measurements Length: (cm) 1 Width: (cm) 3 Depth: (cm) 0.1 Clustered Quantity: 2 Area: (cm) 2.356 Volume: (cm) 0.236 % Reduction in Area: 24.2% % Reduction in Volume: 62.1% Epithelialization: Small (1-33%) Tunneling: No Undermining: No Wound Description Classification: Grade 2 Wound Margin: Distinct, outline attached Exudate Amount: Medium Exudate Type: Serosanguineous Exudate Color: red, brown Foul Odor After Cleansing: No Slough/Fibrino Yes Wound Bed Granulation Amount: Large (67-100%) Exposed Structure Granulation Quality: Red, Pink Fascia Exposed: No Necrotic Amount: Small (1-33%) Fat Layer (Subcutaneous Tissue) Exposed: Yes Necrotic Quality: Adherent Slough Tendon Exposed: No Muscle Exposed: No Joint Exposed: No Bone Exposed: No Treatment Notes Wound #52 (Foot) Wound Laterality: Right Cleanser Soap and Water Discharge Instruction: May shower and wash wound with dial antibacterial soap and water prior to dressing change. Wound Cleanser Discharge Instruction: Cleanse the wound with wound cleanser prior to applying a clean dressing using gauze sponges, not tissue or cotton balls. Peri-Wound Care Topical Primary Dressing MediHoney Gel, tube 1.5 (oz) Discharge Instruction: Apply to wound bed as instructed Secondary Dressing Woven Gauze Sponge, Non-Sterile 4x4 in Discharge Instruction: Apply over primary dressing as directed. ABD Pad, 5x9 Discharge Instruction: Apply over primary dressing as directed. Secured With Elastic Bandage 4 inch (ACE bandage) Discharge Instruction: Secure with ACE bandage as directed. Kerlix Roll Sterile, 4.5x3.1 (in/yd) Discharge Instruction: Secure with Kerlix as directed. Paper Tape, 1x10 (in/yd) Discharge Instruction: Secure dressing with tape as directed. Compression Wrap Compression Stockings Add-Ons Electronic Signature(s) Signed: 02/14/2021 4:38:08 PM By: Lorrin Jackson Entered By: Lorrin Jackson on  02/14/2021 13:15:26 -------------------------------------------------------------------------------- Vitals Details Patient Name: Date of Service: [name] 02/14/2021 1:00 PM Medical Record Number: [id number] Patient Account Number: [id number] Date of Birth/Sex: Treating RN: 1970/05/21 (50 y.o. [name] Primary Care Saga Balthazar: [name], [name] Other Clinician: Referring Druscilla Petsch: Treating Elfreida Heggs/Extender: [name], [name] Weeks in Treatment: 127 Vital Signs Time Taken: 13:07 Temperature (F): 98.5 Height (in): 67 Pulse (bpm): 77 Weight (lbs): 125 Respiratory Rate (  breaths/min): 18 Body Mass Index (BMI): 19.6 Blood Pressure (mmHg): 185/91 Capillary Blood Glucose (mg/dl): 173 Reference Range: 80 - 120 mg / dl Electronic Signature(s) Signed: 02/14/2021 4:38:08 PM By: Lorrin Jackson Entered By: Lorrin Jackson on 02/14/2021 13:08:20

## 2021-03-08 ENCOUNTER — Encounter (HOSPITAL_BASED_OUTPATIENT_CLINIC_OR_DEPARTMENT_OTHER): Payer: Medicare HMO | Attending: Internal Medicine | Admitting: Internal Medicine

## 2021-03-08 ENCOUNTER — Other Ambulatory Visit: Payer: Self-pay

## 2021-03-08 DIAGNOSIS — L97514 Non-pressure chronic ulcer of other part of right foot with necrosis of bone: Secondary | ICD-10-CM | POA: Insufficient documentation

## 2021-03-08 DIAGNOSIS — E1065 Type 1 diabetes mellitus with hyperglycemia: Secondary | ICD-10-CM | POA: Insufficient documentation

## 2021-03-08 DIAGNOSIS — L03116 Cellulitis of left lower limb: Secondary | ICD-10-CM | POA: Diagnosis not present

## 2021-03-08 DIAGNOSIS — Z9641 Presence of insulin pump (external) (internal): Secondary | ICD-10-CM | POA: Diagnosis not present

## 2021-03-08 DIAGNOSIS — Z992 Dependence on renal dialysis: Secondary | ICD-10-CM | POA: Diagnosis not present

## 2021-03-08 DIAGNOSIS — E1022 Type 1 diabetes mellitus with diabetic chronic kidney disease: Secondary | ICD-10-CM | POA: Insufficient documentation

## 2021-03-08 DIAGNOSIS — Z86718 Personal history of other venous thrombosis and embolism: Secondary | ICD-10-CM | POA: Insufficient documentation

## 2021-03-08 DIAGNOSIS — N183 Chronic kidney disease, stage 3 unspecified: Secondary | ICD-10-CM | POA: Insufficient documentation

## 2021-03-08 DIAGNOSIS — Z87891 Personal history of nicotine dependence: Secondary | ICD-10-CM | POA: Diagnosis not present

## 2021-03-08 DIAGNOSIS — Z89512 Acquired absence of left leg below knee: Secondary | ICD-10-CM | POA: Insufficient documentation

## 2021-03-08 DIAGNOSIS — L97529 Non-pressure chronic ulcer of other part of left foot with unspecified severity: Secondary | ICD-10-CM | POA: Insufficient documentation

## 2021-03-08 DIAGNOSIS — E10621 Type 1 diabetes mellitus with foot ulcer: Secondary | ICD-10-CM | POA: Insufficient documentation

## 2021-03-08 NOTE — Progress Notes (Signed)
[name], [name] ([id number]) Visit Report for 03/08/2021 HPI Details Patient Name: Date of Service: [name] 03/08/2021 12:30 PM Medical Record Number: [id number] Patient Account Number: [id number] Date of Birth/Sex: Treating RN: Jun 06, 1970 (50 y.o. F) Primary Care Provider: [name] Other Clinician: Referring Provider: Treating Provider/Extender: Vernelle Emerald in Treatment: 131 History of Present Illness HPI Description: When50 year old diabetic who is known to have type 1 diabetes which is poorly controlled last hemoglobin A1c was 11%. She comes in with a ulcerated area on the left lateral foot which has been there for over 6 months. Was recently she has been treated by [name] of podiatry who saw her last on 05/28/2016. Review of his notes revealed that the patient had incision and drainage with placement of antibiotic beads to the left foot on 04/11/2016 for possible osteomyelitis of the cuboid bone. Over the last year she's had a history of amputation of the left fifth toe and a femoropopliteal popliteal bypass graft somewhere in April 2017. 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 His note [name] mentions that the patient has been referred to me for further wound care and possibly great candidate for hyperbaric oxygen therapy due to recurrent osteomyelitis. However we do not have any x-rays of biopsy reports confirming this. He has been on several antibiotics including Bactrim and most recently is on doxycycline for an MRSA. I understand, the patient was not a candidate for IV antibiotics as she has had previous PICC lines which resulted in blood clots in both arms. There was a x-ray report dated 04/04/2016 on [name] notes which showed evidence of fifth ray resection left foot with osteolytic changes noted to the fourth metatarsal and cuboid bone on the left. 06/13/2016 -- had a left foot x-ray which showed no acute  fracture or dislocation and no definite radiographic evidence of osteomyelitis. Advanced osteopenia was seen. 06/20/2016 -- she has noticed a new wound on the right plantar foot in the region where she had a callus before. 06/27/16- the patient did have her x-ray of the right foot which showed no findings to suggest osteomyelitis. She saw her endocrinologist, [name], yesterday. Her A1c in January was 11. He also indicates mismanagement and noncompliance regarding her diabetes. She is currently on Bactrim for a lip infection. She is complaining of nausea, vomiting and diarrhea. She is unable to articulate the exact orders or dosing of the Bactrim; it is unclear when she will complete this. 07/04/2016 -- results from [facility] of ABIs with ankle waveforms were noted from 02/14/2016. The examination done on 06/27/2015 showed noncompressible ABIs with the right being 1.45 and the left being 1.33. The present examination showed a right ABI of 1.19 on the left of 1.33. The conclusion was that right normal ABI in the lower extremity at rest however compared to previous study which was noncompressible ABI may be falsely elevated side suggesting medial calcification. The left ABI suggested medial calcification. 08/01/2016 -- the patient had more redness and pain on her right foot and did not get to come to see as noted she see her PCP or go to the ER and decided to take some leftover metronidazole which she had at home. As usual, the patient does report she feels and is rather noncompliant. 08/08/2016 -- -- x-ray of the right foot -- FINDINGS:Transmetatarsal amputation is noted. No bony destruction is noted to suggest osteomyelitis. IMPRESSION: No evidence of osteomyelitis. Postsurgical changes are seen. MRI  would be more sensitive for possible bony changes. Culture has grown Serratia Marcescens -- sensitive to Bactrim, ciprofloxacin, ceftazidime she was seen by [name] on 08/06/2016. He did not  find any exposed bone, muscle, tendon, ligament or joint. There was no malodor and he did a excisional debridement in the office. ============ Old notes: 50 year old patient who is known to the wound clinic for a while had been away from the wound clinic since 09/01/2014. Over the last several months she has been admitted to various hospitals including [facility] [place]. She was treated for a right metatarsal osteomyelitis with a transmetatarsal amputation and this was done about 2 months ago. He has a small ulcerated area on the right heel and she continues to have an ulcerated area on the left plantar aspect of the foot. The patient was recently admitted to the [facility] hospital group between 7/12 and 10/18/2014. she was given 3 weeks of IV vancomycin and was to follow-up with her surgeons at [facility] and also took oral vancomycin for C. difficile colitis. 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 MRI of the left foot done on 10/14/2014 showed no abscess or osteomyelitis. 04/27/15; this is a patient we know from previous stays in the wound care center. She is a type I diabetic I am not sure of her control currently. Since the last time I saw her she is had a right transmetatarsal amputation and has no wounds on her right foot and has no open wounds. She is been followed at the wound care center at [facility] in [place]. She comes today with the desire to undergo hyperbaric treatment locally. Apparently one of her wound care providers in [place] has suggested hyperbarics. This is in response to an MRI from 04/18/15 that showed increased marrow signal and loss of the proximal fifth metatarsal cortex  evidence of osteomyelitis with likely early osteomyelitis in the cuboid bone as well. She has a large wound over the base of the fifth metatarsal. She also has a eschar over her the tips of her toes on 1,3 and 5. She does not have peripheral pulses and apparently is going for an angiogram tomorrow which seems reasonable. After this she is going to infectious disease at [facility]. They have been using Medihoney to the large wound on the lateral aspect of the left foot to. The patient has known Charcot deformity from diabetic neuropathy. She also has known diabetic PAD. Surprisingly I can't see that she has had any recent antibiotics, the patient states the last antibiotic she had was at the end of November for 10 days. I think this was in response to culture that showed group G strep although I'm not exactly sure where the culture was from. She is also had arterial studies on 03/29/15. This showed a right ABI of 1.4 that was noncompressible. Her left ABI was 0.73. 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 It was not felt that arterial inflow was adequate for healing of a foot ulcer. Her Doppler waveforms looked monophasic ===== READMISSION 02/28/17; this is in an now 50 year old woman we've had at several different occasions in this clinic. 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 She has a remote ex-smoker. She was last  seen in this clinic by Dr. Con Memos I think in May. More recently she is been followed by her podiatrist [name] an infectious disease Dr. Megan Salon. She has 2 open wounds the major one is over the right first metatarsal head she also has a wound on the left plantar foot. an MRI of the right foot on 01/01/17 showed a soft tissue ulcer along the plantar aspect of the first metatarsal base consistent with osteomyelitis of the first metatarsal stump. Dr. 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 According to the patient this is been open for slightly over a month. She has been on a combination of Cipro 500 twice a day, Zyvox 600 twice a day and Flagyl 500 3 times a day for over a month now as directed by Dr. Megan Salon. cultures of the right foot earlier this year showed MRSA in January and Serratia in May. January also had a few viridans strep. Recent x-rays of both feet were done and [name] office and I don't have these reports. The patient has known PAD and has a history of aleft femoropopliteal bypass in April 2017. 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 Dwyane Dee. In spite of this the last hemoglobin A1c I can see is 10.1 on 01/01/2017. She is being referred by 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 She is been using Medihoney to this area and also an area on the left midfoot. She is using healing sandals bilaterally. ABIs in this clinic at the left posterior tibial was 1.1 noncompressible on the right READMISSION Non invasive vascular NOVANT 5/18 Aftercare following surgery of the circulatory system Procedure Note - Interface, External Ris In - 08/13/2016 11:05 AM EDT Procedure: Examination consists of physiologic resting arterial pressures of the brachial and ankle arteries bilaterally with continuous wave Doppler waveform analysis. Previous: Previous exam performed on 02/14/16 demonstrated ABIs of Rt = 1.19 and Lt = 1.33. Right: ABI = non-compressible PT 1.47 DP. S/P transmet amputation. , Left: ABI = 1.52, 2nd digit pressure = 87 mmHg Conclusions: Right: ABI (>1.3) may be falsely elevated, suggesting medial calcification. Left: ABI (>1.3) may be falsely elevated, suggesting  medial calcification The patient is a now 50 year old type I diabetic is had multiple issues her graded to chronic diabetic foot ulcers. 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 We had her in the clinic lastin November. At that point she had wounds on her bilateral feet.she had wanted to try hyperbarics however the healogics review process denied her because she hadn't followed up with her vascular surgeon for her left femoropopliteal bypass. The bypass was done by Dr. Raul Del at [facility]. We made her a follow-up with Dr. Raul Del however she did not keep the appointment and therefore she was not approved The patient shows me a small wound on her left fourth metatarsal head on her phone. She developed rapid discoloration in the plantar aspect of the left foot and she was admitted to hospital from 2/2 through 05/10/17 with wet gangrene of the left foot osteomyelitis of the fourth metatarsal heads. She was admitted acutely ill with a temperature of 103. She was started on broad-spectrum vancomycin  and cefepime. On 05/06/17 she was taken to the OR by [name] her podiatric surgeon for an incision and drainage irrigation of the left foot wound. Cultures from this surgery revealed group be strep and anaerobes. she was seen by Dr.Xu of orthopedic surgery and scheduled for a below-knee amputation which she u refused. Ultimately she was discharged on Levaquin and Flagyl for one month. MRI 05/05/17 done while she was in the hospital showed abscess adjacent to the fourth metatarsal head and neck small abscess around the fourth flexor tendon. Inflammatory phlegmon and gas in the soft tissues along the lateral aspect of the fourth phalanx. Findings worrisome for osteomyelitis involving the fourth proximal and middle phalanx and also the third and fourth metatarsals. Finally the patient had actually shortly before this followed up  with Dr. Raul Del at no time on 04/29/17. He felt that her left femoropopliteal bypass was patent he felt that her left-sided toe pressures more than adequate for healing a wound on the left foot. This was before her acute presentation. 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 05/28/17; she is started hyperbarics. The patient tells me that for some reason she was not actually on Levaquin but I think on ciprofloxacin. She was on Flagyl. She only started her Levaquin yesterday due to some difficulty with the pharmacy and perhaps her sister picking it up. She has an appointment with [name] tomorrow and with infectious disease early next week. She has no new complaints 06/06/17; the patient continues in hyperbarics. She saw [name] on 05/29/17 who is her podiatric surgeon. He is elected for a transmetatarsal amputation on 06/27/17. I'm not sure at what level he plans to do this amputation. The patient is unaware She also saw Dr. Megan Salon of infectious disease who elected to continue her on current antibiotics I think this is ciprofloxacin and Flagyl. I'll need to clarify with her tomorrow if she actually has this. We're using silver alginate to the actual wound. Necrotic surface today with material under the flap of her foot. Original MRI showed abscesses as well as osteomyelitis of the proximal and middle fourth phalanx and the third and fourth metatarsal heads 06/11/17; patient continues in hyperbarics and continues on oral antibiotics. She is doing well. The wound looks better. The necrotic part of this under the flap in her superior foot also looks better. she is been to see [name]. I haven't had a chance to look at his note. Apparently he has put the transmetatarsal amputation on hold her request it is still planning to take her to the OR for debridement and product application ACEL. I'll see if I can find his note. I'll therefore leave product ordering/requests to [name] for now. I was going to  look at Dermagraft 06/18/17-she is here in follow-up evaluation for bilateral foot wounds. She continues with hyperbaric therapy. She states she has been applying manuka honey to the right plantar foot and alternate manuka honey and silver alginate to the left foot, despite our orders. We will continue with same treatment plan and she will follo up next week. 06/25/17; I have reviewed [name] last note from 3/11. She has operative debridement in 2 days' time. By review his note apparently they're going to place there is skin over the majority of this wound which is a good choice. She has a small satellite area at the most proximal part of this wound on the left plantar foot. The area on the right plantar foot we've been using silver  alginate and it is close to healing. 07/02/17; unfortunately the patient was not easily approved for [name] proposed surgery. I'm not completely certain what the issue is. She has been using silver alginate to the wound she has completed a first course of hyperbarics. She is still on Levaquin and Flagyl. I have really lost track of the time course here.I suspect she should have another week to 2 of antibiotics. I'll need to see if she is followed up with infectious disease Dr. Megan Salon 07/09/17; the patient is followed up with Dr. Megan Salon. 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 She continues on Levaquin and metronidazole continuing both of these for now I think she is been on fr about 6 weeks. She still has some drainage but no pain. No fever. Her had been plans for her to go to the OR for operative debridement with her podiatrist [name], I am not exactly sure where that is. I'll probably slip a note to [name] today. I note that she follows with Dr. Dwyane Dee of endocrinology. We have her recertified for hyperbaric oxygen. I have not heard about Dermagraft however I'll see if [name] is planning a skin substitute as  well 07/16/17; the patient tells me she is just about out of [place]. I'll need to check Dr. Hale Bogus last notes on this. She states she has plenty of Flagyl however. She comes in today complaining of pain in the right lateral foot which she said lasted for about a day. The wound on the right foot is actually much more medially. She also tells me that the [facility] cost a lot of pain in the left foot wound and she turned back to silver alginate. Finally Dermagraft has a $951 per application co-pay. She cannot afford this 07/23/17; patient arrives today with the wound not much smaller. There is not much new to add. She has not heard from [name] all try to put in a call to them today. She was asking about Dermagraft again and she has an over $884 per application co-pay she states that she would be willing to try to do a payment plan. I been tried to avoid this. We've been using silver alginate, I'll change to [facility] 07/30/17-She is here in follow-up evaluation for left foot ulcer. She continues hyperbaric medicine. The left foot ulcer is stable we will continue with same treatment plan 08/06/17; she is here for evaluation of her left foot ulcer. Currently being treated for hyperbarics or underlying osteomyelitis. She is completed antibiotics. The left foot ulcer is better smaller with healthier looking granulation. For various reasons I am not really clear on we never got her back to the OR with [name]. He did not respond to my secure text message. Nevertheless I think that surgery on this point is not necessary nor am I completely clear that a skin substitute is necessary The patient is complaining about pain on the outside of her right foot. 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 There is no erythema. She also states the foot is warm versus her other part of her upper leg and this is largely true. It is not totally clear to me what's causing this. She thinks it's  different from her usual neuropathy pain 08/13/17; she arrives in clinic today with a small wound which is superficial on her right first metatarsal head. 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 She tells [place]  she was up on her feet over the Mother's Day celebration. The large wound is on the left foot. Continues with hyperbarics for underlying osteomyelitis. We're using Hydrofera Blue. She asked me today about where we were with Dermagraft. I had actually excluded this because of the co-pay however she wants to assume this therefore I'll recheck the co-pay an order for next week. 08/20/17; the patient agreed to accept the co-pay of the first Apligraf which we applied today. She is disappointed she is finishing hyperbarics will run this through the insurance on the extent of the foot infection and the extent of the wound that she had however she is already had 60 dive's. Dermagraft No. 1 08/27/17; Dermagraft No. 2. She is not eligible for any more hyperbaric treatments this month. She reports a fair amount of drainage and she actually changed to the external dressings without disturbing the direct contact layer 09/03/17; the patient arrived in clinic today with the wound superficially looking quite healthy. Nice vibrant red tissue with some advancing epithelialization although not as much adherence of the flap as I might like. However she noted on her own fourth toe some bogginess and she brought that to our attention. Indeed this was boggy feeling like a possibility of subcutaneous fluid. 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 She is not been unwell. We've been using Dermagraft 09/10/17; the culture that I did not last week was MRSA. She saw Dr. Megan Salon this morning who is going to start her on vancomycin. I had sent him a secure a text message yesterday. I also spoke with her podiatric surgeon [name]  about surgery on this foot the options for conserving a functional foot etc. Promised me he would see her and will make back consultation today. Paradoxically her actual wound on the plantar aspect of her left foot looks really quite good. I had given her 5 days worth of Baxdella to cover her for MRSA. Her MRI came back showing osteomyelitis within the third metatarsal shaft and head and base of the third and fourth proximal phalanx. She had extensive inflammatory changes throughout the soft tissue of the lateral forefoot. With an ill-defined fluid around the fourth metatarsal extending into the plantar and dorsal soft tissues 09/19/17; the patient is actually on oral Septra and Flagyl. She apparently refused IV vancomycin. She also saw [name] at my request who is planning her for a left BKA sometime in mid July. MRI showed osteomyelitis within the third metatarsal shaft and head and the basis of the third and fourth proximal phalanx. I believe there was felt to be possible septic arthritis involving the third MTP. 09/26/17; the patient went back to Dr. Megan Salon at my suggestion and is now receiving IV daptomycin. Her wound continues to look quite good making the decision to proceed with a transmetatarsal amputation although more difficult for the patient. I believe in my extensive discussions with her she has a good sense of the pros and cons of this. I don't NV the tuft decision she has to make. She has an appointment with [name] I believe in mid July and I previously spoken to him about this issue Has we had used 3 previous Dermagraft. Given the condition of the wound surface I went ahead and added the fourth one today area and I did this not fully realizing that she'll be traveling to [place] next week. I'm hopeful  she can come back in 2 weeks 10/21/17; Her same Dermagraft on for about 3-1/2 weeks. In spite of this the wound arrives looking quite healthy. There is been a lot of healing  dimensions are smaller. Looking at the square shaped wound she has now there is some undermining and some depth medially under the undermining although I cannot palpate any bone. No surrounding infection is obvious. She has difficult questions about how to look at this going forward vis--vis amputations versus continued medical therapy. T be truthful the wound is looks o so healthy and it is continued to contract. Hard to justify foot surgery at this point although I still told her that I think it might come to that if we are not able to eradicate the underlying MRSA. She is still highly at risk and she understands this 11/06/17 on evaluation today patient appears to be doing better in regard to her foot ulcer. She's been tolerating the dressing changes without complication. Currently she is here for her Dermagraft #6. Her wound continues to make excellent progress at this point. She does not appear to have any evidence of infection which is good news. 11/13/17 on evaluation today patient appears to be doing excellent at this time. She is here for repeat Dermagraft application. This is #7. Overall her wound seems to be making great progress. 12/05/17; the patient arrives with the wound in much better condition than when I last saw this almost 6 weeks ago. She still has a small probing area in the left metatarsal head region on the lateral aspect of her foot. We applied her last Dermagraft today. Since the last time she is here she has what appears to a been a blood blister on the plantar aspect of left foot although I don't see this is threatening. There is also a thick raised tissue on the right mid metatarsal head region. This was not there I don't think the last time she was here 3 weeks ago. 12/12/17; the patient continues to have a small programming area in the left metatarsal head region on the lateral aspect of her foot which was the initial large surgical wound. I applied her last Apligraf last  week. I'm going to use Endoform starting today Unfortunately she has an excoriated area in the left mid foot and the right mid foot. The left midfoot looks like a blistered area this was not opened last week it certainly is open today. Using silver alginate on these areas. She promises me she is offloading this. 12/19/17; the small probing area in the left metatarsal head eyes think is shallower. In general her original wound looks better. We've been using Endoform. The area inferiorly that I think was trauma last week still requires debridement a lot of nonviable surface which I removed. She still has an open open area distally in her foot Similarly on the right foot there is tightly adherent surface debris which I removed. Still areas that don't look completely epithelialized. This is a small open area. We used silver alginate on these areas 12/26/2017; the patient did not have the supplies we ordered from last week including the Endoform. The original large wound on the left lateral foot looks healthy. She still has the undermining area that is largely unchanged from last week. She has the same heavily callused raised edged wounds on the right mid and left midfoot. Both of these requiring debridement. We have been using silver alginate on these areas 01/02/2018; there is still supply issues. We  are going to try to use Prisma but I am not sure she actually got it from what she is saying. She has a new open area on the lateral aspect of the left fourth toe [previous fifth ray amputation]. Still the one tunneling area over the fourth metatarsal head. The area is in the midfoot bilaterally still have thick callus around them. She is concerned about a raised swelling on the lateral aspect of the foot. However she is completely insensate 01/10/2018; we are using Prisma to the wounds on her bilateral feet. Surprisingly the tunneling area over the left fourth metatarsal head that was part of her original  surgery has closed down. She has a small open area remaining on the incision line. 2 open areas in the midfoot. 02/10/2018; the patient arrives back in clinic after a month hiatus. She was traveling to visit family in [place]. Is fairly clear she was not offloading the areas on her feet. The original wound over the left lateral foot at the level of metatarsal heads is reopened and probes medially by about a centimeter or 2. She notes that a week ago she had purulent drainage come out of an area on the left midfoot. Paradoxically the worst area is actually on the right foot is extensive with purulent drainage. We will use silver alginate today 02/17/2018; the patient has 3 wounds one over the left lateral foot. She still has a small area over the metatarsal heads which is the remnant of her original surgical wound. This has medial probing depth of roughly 1.4 cm somewhat better than last week. The area on the right foot is larger. We have been using silver alginate to all areas. The area on the right foot and left foot that we cultured last week showed both Klebsiella and Proteus. Both of these are quinolone sensitive. The patient put her's self on Bactrim and Flagyl that she had left hanging around from prior antibiotic usages. She was apparently on this last week when she arrived. I did not realize this. Unfortunately the Bactrim will not cover either 1 of these organisms. We will send in Cipro 500 twice daily for a week 03/04/2018; the patient has 2 wounds on the left foot one is the original wound which was a surgical wound for a deep DFU. At one point this had exposed bone. She still has an area over the fourth metatarsal head that probes about 1.4 cm although I think this is better than last week. I been using silver nitrate to try and promote tissue adherence and been using silver alginate here. She also has an area in the left midfoot. This has some depth but a small linear wound. Still  requiring debridement. On the right midfoot is a circular wound. A lot of thick callus around this area. We have been using silver alginate to all wound areas She is completed the ciprofloxacin I gave her 2 weeks ago. 03/11/2018; the patient continues to have 2 open areas on the left foot 1 of which was the original surgical wound for a deep DFU. Only a small probing area remains although this is not much different from last week we have been using silver alginate. The other area is on the midfoot this is smaller linear but still with some depth. We have been using silver alginate here as well On the right foot she has a small circular wound in the mid aspect. This is not much smaller than last time. We have been using silver  alginate here as well 03/18/2018; she has 3 wounds on the left foot the original surgical wound, a very superficial wound in the mid aspect and then finally the area in the mid plantar foot. She arrives in today with a very concerning area in the wound in the mid plantar foot which is her most proximal wound. There is undermining here of roughly 1-1/2 cm superiorly. Serosanguineous drainage. She tells me she had some pain on for over the weekend that shot up her foot into her thigh and she tells me that she had a nodule in the groin area. She has the single wound in the right foot. We are using endoform to both wound areas 03/24/2018; the patient arrives with the original surgical wound in the area on the left midfoot about the same as last week. There is a collection of fluid under the surface of the skin extending from the surgical wound towards the midfoot although it does not reach the midfoot wound. The area on the right foot is about the same. Cultures from last week of the left midfoot wound showed abundant Klebsiella abundant Enterococcus faecalis and moderate methicillin resistant staph I gave her Levaquin but this would have only covered the Klebsiella. She will need  linezolid 04/01/2018; she is taking linezolid but for the first few days only took 1 a day. I have advised her to finish this at twice daily dosing. In any case all of her wounds are a lot better especially on the left foot. The original surgical wound is closed. The area on the left midfoot considerably smaller. The area on the right foot also smaller. 04/08/2018; her original surgical wound/osteomyelitis on the left foot remains closed. She has area on the left foot that is in the midfoot area but she had some streaking towards this. This is not connected with her original wound at least not visually. Small wound on the right midfoot appears somewhat smaller. 04/15/18; both wounds looks better. Original wound is better left midfoot. Using silver alginate 1/21; patient states she uses saltwater soak in, stones or remove callus from around her wounds. She is also concerned about a blood blister she had on the left foot but it simply resolved on its own. We've been using silver alginate 1/28; the patient arrives today with the same streaking area from her metatarsals laterally [the site of her original surgical wound] down to the middle of her foot. There is some drainage in the subcutaneous area here. This concerns me that there is actually continued ongoing infection in the metatarsals probably the fourth and third. This fixates an MRI of the foot without contrast [chronic renal failure] The wound in the mid part of the foot is small but I wonder whether this area actually connects with the more distal foot. The area on the right midfoot is probably about the same. Callus thick skin around the small wound which I removed with a curette we have been using silver alginate on both wound areas 2/4; culture I did of the draining site on the left foot last time grew methicillin sensitive staph aureus. MRI of the left foot showed interval resolution of the findings surrounding the third metatarsal joint on  the prior study consistent with treated osteomyelitis. Chronic soft tissue ulceration in the plantar and lateral aspect of the forefoot without residual focal fluid collection. No evidence of recurrent osteomyelitis. 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 She tells me she had nausea and vomiting over the weekend and missed 2 days. In general her foot looks much the same. She has a small open area just below the left fourth metatarsal head. A linear area in the left midfoot. Some discoloration extending from the inferior part of this into the left lateral foot although this appears to be superficial. She has a small area on the right midfoot which generally looks smaller after debridement 2/18; the patient is completing his cephalexin and has another 2 days. She continues to have open areas on the left and right foot. 2/25; she is now off antibiotics. The area on the left foot at the site of her original surgical wound has closed yet again. She still has open areas in the mid part of her foot however these appear smaller. The area on the right mid foot looks about the same. We have been using silver alginate She tells me she had a serious hypoglycemic spell at home. She had to have EMS called and get IV dextrose 3/3; disappointing on the left lateral foot large area of necrotic tissue surrounding the linear area. This appears to track up towards the same original surgical wound. Required extensive debridement. The area on the right plantar foot is not a lot better also using silver 3/12; the culture I did last time showed abundant enterococcus. I have prescribed Augmentin, should cover any unrecognized anaerobes as well. In addition there were a few MRSA and  Serratia that would not be well covered although I did not want to give her multiple antibiotics. She comes in today with a new wound in the right midfoot this is not connected with the original wound over her MTP a lot of thick callus tissue around both wounds but once again she said she is not walking on these areas 3/17-Patient comes in for follow-up on the bilateral plantar wounds, the right midfoot and the left plantar wound. Both these are heavily callused surrounding the wounds. We are continuing to use silver alginate, she is compliant with offloading and states she uses a wheelchair fairly often at home 3/24; both wound areas have thick callus. However things actually look quite a bit better here for the majority of her left foot and the right foot. 3/31; patient continues to have thick callused somewhat irritated looking tissue around the wounds which individually are fairly superficial. There is no evidence of surrounding infection. We have been using silver alginate however I change that to [facility] today 4/17; patient returns to clinic after having a scare with Covid she tested negative in her primary doctor's office. She has been using Hydrofera Blue. She does not have an open area on the right foot. On the left foot she has a small open area with the mid area not completely viable. She showed me pictures of what looks like a hemorrhagic blister from several days ago but that seems to have healed over this was on the lateral left foot 4/21; patient comes in to clinic with both her wounds on her feet closed. However over the weekend she started having pain in her right foot and leg up into the thigh. She felt as though she was running a low-grade fever but did not take her temperature. She took a doxycycline that she had leftover and  yesterday a single Septra and metronidazole. She thinks things feel somewhat better. 4/28; duplex ultrasound I ordered last week was negative for DVT or  superficial thrombophlebitis. She is completed the doxycycline I gave her. States she is still having a lot of pain in the right calf and right ankle which is no better than last week. She cannot sleep. She also states she has a temperature of up to 101, coughing and complaining of visual loss in her bilateral eyes. Apparently she was tested for Covid 2 weeks ago at [facility] and that was negative. Readmission: 09/03/18 patient presents back for reevaluation after having been evaluated at the end of April regarding erythema and swelling of her right lower extremity. Subsequently she ended up going to the hospital on 07/29/18 and was admitted not to be discharged until 08/08/18. Unfortunately it was noted during the time that she was in the hospital that she did have methicillin-resistant Staphylococcus aureus as the infection noted at the site. It was also determined that she did have osteomyelitis which appears to be fairly significant. She was treated with vancomycin and in fact is still on IV vancomycin at dialysis currently. This is actually slated to continue until 09/12/18 at least which will be the completion of the six weeks of therapy. Nonetheless based on what I'm seeing at this point I'm not sure she will be anywhere near ready to discontinue antibiotics at that time. Since she was released from the hospital she was seen by [name] who is her podiatrist on 08/27/18. His note specifically states that he is recommended that the patient needs of one knee amputation on the right as she has a life- threatening situation that can lead quickly to sepsis. The patient advised she would like to try to save her leg to which [name] apparently told her that this was against all medical advice. She also want to discontinue the Wound VAC which had been initiated due to the fact that she wasn't pleased with how the wound was looking and subsequently she wanted to pursue applying Medihoney at that time. He stated  that he did not believe that the right lower extremity was salvageable and that the patient understood but would still like to attempt hyperbaric option therapy if it could be of any benefit. She was therefore referred back to [place] for further evaluation. He plans to see her back next week. Upon inspection today patient has a significant amount purulent drainage noted from the wound at this point. The bone in the distal portion of her foot also appears to be extremely necrotic and spongy. When I push down on the bone it bubbles and seeps purulent drainage from deeper in the end of the foot. I do not think that this is likely going to heal very well at all and less aggressive surgical debridement were undertaken more than what I believe we can likely do here in our office. 09/12/2018; I have not seen this patient since the most recent hospitalization although she was in our clinic last week. I have reviewed some of her records from a complex hospitalization. 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 There is situation was complicated by MRSA bacteremia and acute on chronic renal failure now on dialysis. She is receiving vancomycin at dialysis. We started her on Dakin's wet-to-dry last week she is changing this daily. There is still purulent drainage coming out of her foot. Although she is apparently "agreeable" to  a below-knee amputation which is been suggested by multiple clinicians she wants this to be done in [place]. She apparently has a telehealth visit with that provider sometime in late [place] 6/24. I have told her I think this is probably too long. Nevertheless I could not convince her to allow a local doctor to perform BKA. 09/19/2018; the patient has a large necrotic area on the right anterior foot. She has had previous transmetatarsal amputations. Culture I did last week showed MRSA nothing else she is on vancomycin at dialysis. She has  continued leaking purulent drainage out of the distal part of the large circular wound on the right anterior foot. She apparently went to see Dr. 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 Somehow that translated into her being referred to plastic surgery for debridement of the area. I gather she basically refused amputation although I do not have a copy of Dr. Berenice Primas notes. The patient really wants to have a trial of hyperbaric oxygen. I agreed with initial assessment in this clinic that this was probably too far along to benefit however if she is going to have plastic surgery I think she would benefit from ancillary hyperbaric oxygen. The issue here is that the patient has benefited as maximally as any patient I have ever seen from hyperbaric oxygen therapy. Most recently she had exposed bone on the lateral part of her left foot after a surgical procedure and that actually has closed. She has eschared areas in both heels but no open area. She is remained systemically well. I am not optimistic that anything can be done about this but the patient is very clear that she wants an attempt. The attempt would include a wound VAC further debridements and hyperbaric oxygen along with IV antibiotics. 6/26; I put her in for a trial of hyperbaric oxygen only because of the dramatic response she has had with wounds on her left midfoot earlier this year which was a surgical wound that went straight to her bone over the metatarsal heads and also remotely the left third toe. We will see if we can get this through our review process and insurance. She arrives in clinic with again purulent material pouring out of necrotic bone on the top of the foot distally. There is also some concerning erythema on the front of the leg that we marked. It is bit difficult to tell how tender this is because of neuropathy. I note from infectious disease that she had her vancomycin extended. All the  cultures of these areas have shown MRSA sensitive to vancomycin. She had the wound VAC on for part of the week. The rest of the time she is putting various things on this including Medihoney, "ionized water" silver sorb gel etc. 7/7; follow-up along with HBO. She is still on vancomycin at dialysis. She has a large open area on the dorsal right foot and a small dark eschar area on her heel. There is a lot less erythema in the area and a lot less tenderness. From an infection point of view I think this is better. She still has a lot of necrosis in the remaining right forefoot [previous TMA] we are still using the wound VAC in this area 7/16; follow-up along with HBO. I put her on linezolid after she finished her vancomycin. We started this last Friday I gave her 2 weeks worth. I had the expectation that she would be operatively debrided by Dr. Marla Roe but that still has not happened  yet. Patient phoned the office this week. She arrives for review today after HBO. The distal part of this wound is completely necrotic. Nonviable pieces of tendon bone was still purulent drainage. Also concerning that she has black eschar over the heel that is expanding. I think this may be indicative of infection in this area as well. She has less erythema and warmth in the ankle and calf but still an abnormal exam 7/21 follow-up along with HBO. I will renew her linezolid after checking a CBC with differential monitoring her blood counts especially her platelets. She was supposed to have surgery yesterday but if I am reading things correctly this was canceled after her blood sugar was found to be over 500. I thought Dr. Marla Roe who called me said that they were sending her to the ER but the patient states that was not the case. 7/28. Follow-up along with HBO. She is on linezolid I still do not have any lab work from dialysis even though I called last week. The patient is concerned about an area on her left lateral  foot about the level of the base of her fifth metatarsal. I did not really see anything that ominous here however this patient is in [place] ability to point out problems that she is sensing and she has been accurate in the past Finally she received a call from Dr. Marla Roe who is referring her to another orthopedic surgeon stating that she is too booked up to take her to the operating room now. Was still using a wound VAC on the foot 8/3 -Follow-up after HBO, she is got another week of linezolid, she is to call ID for an appointment, x-rays of both feet were reviewed, the left foot x-ray with third MTP joint osteo- Right foot x-ray widespread osteo-in the right midfoot Right ankle x-ray does not show any active evidence of infection 8/11-Patient is seen after HBO, the wounds on the right foot appear to be about the same, the heel wound had some necrotic base over tendon that was debrided with a curette 8/21; patient is seen after HBO. The patient's wound on her dorsal foot actually looks reasonably good and there is substantial amount of epithelialization however the open area distally still has a lot of necrotic debris partially bone. I cannot really get a good sense of just how deep this probes under the foot. She has been pressuring me this week to order medical maggots through a company in [place] for her. The problem I have is there is not a defined wound area here. On the positive side there is no purulence. She has been to see infectious disease she is still on Septra DS although I have not had a chance to review their notes 8/28; patient is seen in conjunction with HBO. The wounds on her foot continued to improve including the right dorsal foot substantially the, the distal part of this wound and the area on the right heel. We have been using a wound VAC over this chronically. She is still on trimethoprim as directed by infectious disease 9/4; patient is seen in conjunction with HBO.  Right dorsal foot wound substantially anteriorly is better however she continues to have a deep wound in the distal part of this that is not responding. We have been using silver collagen under border foam Area on the right plantar medial heel seems better. We have been using Hydrofera Blue 12/12/18 on evaluation today patient appears to be doing about the same with regard  to her wound based on prior measurements. She does have some necrotic tissue noted on the lateral aspect of the wound that is going require a little bit of sharp debridement today. This includes what appears to be potentially either severely necrotic bone or tendon. Nonetheless other than that she does not appear to have any severe infection which is good news 9/18; it is been 2 weeks since I saw this wound. She is tolerating HBO well. Continued dramatic improvement in the area on the right dorsal foot. She still has a small wound on the heel that we have been using Hydrofera Blue. She continues with a wound VAC 9/24; patient has to be seen emergently today with a swelling on her right lateral lower leg. She says that she told Dr. Evette Doffing about this and also myself on a couple of occasions but I really have no recollection of this. She is not systemically unwell and her wound really looked good the last time I saw this. She showed this to providers at dialysis and she was able to verify that she was started on cephalexin today for 5 doses at dialysis. She dialyzes on Tuesday Thursday and Saturday. 10/2; patient is seen in conjunction with HBO. The area that is draining on the right anterior medial tibia is more extensive. Copious amounts of serosanguineous drainage with some purulence. We are still using the wound VAC on the original wound then it is stable. Culture I did of the original IandD showed MRSA I contacted dialysis she is now on vancomycin with dialysis treatments. I asked them to run a month 10/9; patient seen in  conjunction with HBO. She had a new spontaneous open area just above the wound on the right medial tibia ankle. More swelling on the right medial tibia. Her wound on the foot looks about the same perhaps slightly better. There is no warmth spreading up her leg but no obvious erythema. her MRI of the foot and ankle and distal tib-fib is not booked for next Friday I discussed this with her in great detail over multiple days. it is likely she has spreading infection upper leg at least involving the distal 25% above the ankle. She knows that if I refer her to orthopedics for infectious disease they are going to recommend amputation and indeed I am not against this myself. We had a good trial at trying to heal the foot which is what she wanted along with antibiotics debridement and HBO however she clearly has spreading infection [probably staph aureus/MRSA]. Nevertheless she once again tells me she wants to wait the left of the MRI. She still makes comments about having her amputation done in [place]. 10/19; arrives today with significant swelling on the lateral right leg. Last culture I did showed Klebsiella. Multidrug-resistant. Cipro was intermediate sensitivity and that is what I have her on pending her MRI which apparently is going to be done on Thursday this week although this seems to be moving back and forth. She is not systemically unwell. We are using silver alginate on her major wound area on the right medial foot and the draining areas on the right lateral lower leg 10/26; MRI showed extensive abscess in the anterior compartment of the right leg also widespread osteomyelitis involving osseous structures of the midfoot and portions of the hindfoot. Also suspicion for osteomyelitis anterior aspect of the distal medial malleolus. Culture I did of the purulence once again showed a multidrug-resistant Klebsiella. I have been in contact with nephrology late last  week and she has been started on  cefepime at dialysis to replace the vancomycin We sent a copy of her MRI report to Dr. Geroge Baseman in [place] who is an orthopedic surgeon. The patient takes great stock in his opinion on this. She says she will go to [place] to have her leg amputated if Dr. Geroge Baseman does not feel there is any salvage options. 11/2; she still is not talk to her orthopedic surgeon in [place]. Apparently he will call her at [phone] this afternoon. The quality of this is she has not allowed me to refer her anywhere. She has been told over and over that she needs this amputated but has not agreed to be referred. She tells me her blood sugar was 600 last night but she has not been febrile. 11/9; she never did got a call from the orthopedic surgeon in [place] therefore that is off the radar. We have arranged to get her see orthopedic surgery at [facility]. She still has a lot of draining purulence coming out of the new abscess in her right leg although that probably came from the osteomyelitis in her right foot and heel. Meanwhile the original wound on the right foot looks very healthy. Continued improvement. The issue is that the last MRI showed osteomyelitis in her right foot extensively she now has an abscess in the right anterior lower leg. There is nobody in [place] who will offer this woman anything but an amputation and to be honest that is probably what she needs. I think she still wants to talk about limb salvage although at this point I just do not see that. She has completed her vancomycin at dialysis which was for the original staph aureus she is still on cefepime for the more recent Klebsiella. She has had a long course of both of these antibiotics which should have benefited the osteomyelitis on the right foot as well as the abscess. 11/16; apparently [place] elective surgery is shut down because of COVID-19 pandemic. I have reached out to some contacts at [facility] to see if we can get her an  orthopedic appointment there. I am concerned about continually leaving this but for the moment everything is static. In fact her original large wound on this foot is closing down. It is the abscess on the right anterior leg that continues to drain purulent serosanguineous material. She is not currently on any antibiotics however she had a prolonged course of vancomycin [1 month] as well as cefepime for a month 02/24/2019 on evaluation today patient appears to be doing better than the last time I saw her. This is not a patient that I typically see. With that being said I am covering for Dr. Dellia Nims this week and again compared to when I last saw her overall the wounds in particular seem to be doing significantly better which is good news. With that being said the patient tells me several disconcerting things. She has not been able to get in to see anyone for potential debridement in regard to her leg wounds although she tells me that she does not think it is necessary any longer because she is taking care of that herself. She noticed a string coming out of the lower wound on her leg over the last week. The patient states that she subsequently decided that we must of pack something in there and started pulling the string out and as it kept coming and coming she realized this was likely her tendon. With that being said she  continued to remove as much of this as she could. She then I subsequently proceeded to using tubes of antibiotic ointment which she will stick down into the wound and then scored as much as she can until she sees it coming out of the other wound opening. She states that in doing this she is actually made things better and there is less redness and irritation. With regard to her foot wound she does have some necrotic tendon and tissue noted in one small corner but again the actual wound itself seems to be doing better with good granulation in general compared to my last evaluation. 12/7;  continued improvement in the wound on the substantial part of the right medial foot. Still a necrotic area inferiorly that required debridement but the rest of this looks very healthy and is contracting. She has 2 wounds on the right lateral leg which were her original drainage sites from her abscess but all of this looks a lot better as well. She has been using silver alginate after putting antibiotic biotic ointment in one wound and watching it come out the other. I have talked to her in some detail today. I had given her names of orthopedic surgeons at [facility] for second opinion on what to do about the right leg. I do not think the patient never called them. She has not been able to get a hold of the orthopedic surgeon in [place] that she had put a lot of faith in as being somebody would give her an opinion that she would trust. I talked to her today and said even if I could get her in to another orthopedic surgeon about the leg which she accept an amputation and she said she would not therefore I am not going to press this issue for the moment 12/14; continued improvement in his substantial wound on the right medial foot. There is still a necrotic area inferiorly with tightly adherent necrotic debris which I have been working on debriding each time she is here. She does not have an orthopedic appointment. Since last time she was here I looked over her cultures which were essentially MRSA on the foot wound and gram-negative rods in the abscess on the anterior leg. 12/21; continued improvement in the area on the right medial foot. She is not up on this much and that is probably a good thing since I do not know it could support continuous ambulation. She has a small area on the right lateral leg which were remanence of the IandD's I did because of the abscess. I think she should probably have prophylactic antibiotics I am going to have to look this over to see if we can make an intelligent  decision here. In the meantime her major wound is come down nicely. Necrotic area inferiorly is still there but looks a lot better 04/06/2019; she has had some improvement in the overall surface area on the right medial foot somewhat narrowedr both but somewhat longer. The areas on the right lateral leg which were initial IandD sites are superficial. Nothing is present on the right heel. We are using silver alginate to the wound areas 1/18; right medial foot somewhat smaller. Still a deep probing area in the most distal recess of the wound. She has nothing open on the right leg. She has a new wound on the plantar aspect of her left fourth toe which may have come from just pulling skin. The patient using Medihoney on the wound on her foot under  silver alginate. I cannot discourage her from this 2/1; 2-week follow-up using silver alginate on the right foot and her left fourth toe. The area on the right dorsal foot is contracted although there is still the deep area in the most distal part of the wound but still has some probing depth. No overt infection 2/15; 2-week follow-up. She continues to have improvement in the surface area on the dorsal right foot. Even the tunneling area from last time is almost closed. The area that was on the plantar part of her left fourth toe over the PIP is indeed closed 3/1; 2-week follow-up. Continued improvement in surface area. The original divot that we have been debriding inferiorly I think has full epithelialization although the epithelialization is gone down into the wound with probably 4 mm of depth. Even under intense illumination I am unable to see anything open here. The remanence of the wound in this area actually look quite healthy. We have been using silver alginate 3/15; 2-week follow-up. Unfortunately not as good today. She has a comma shaped wound on the dorsal foot however the upper part of this is larger. Under illumination debris on the surface She also  tells [place] that she was on her right leg 2 times in the last couple of weeks mostly to reach up for things above her head etc. She felt a sharp pain in the right leg which she thinks is somewhere from the ankle to the knee. The patient has neuropathy and is really uncertain. She cannot feel her foot so she does not think it was coming from there 3/29; 2-week follow-up. Her wound measures smaller. Surface of the wound appears reasonable. She is using silver alginate with underlying Medihoney. She has home health. X-rays I did of her tib-fib last time were negative although it did show arterial calcification 4/12; 2-week follow-up. Her wound measures smaller in length. Using manuka honey with silver alginate on top. She has home health. 4/26; 2-week follow-up. Her wound is smaller but still very adherent debris under illumination requiring debridement she has been using manuka honey with silver alginate. 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 Been using Hydrofera Blue. She is on suppressive Bactrim but apparently she has been taking it twice daily 6/7; I have not seen this wound and about 6 weeks. Since then she was up in [place]. By her own admission she was walking on the foot because she did not have a wheelchair. The wound is not nearly as healthy looking as it was the last time I saw this. We ordered different things for her but she only uses Medihoney and silver alginate. As far as I know she is on suppressive trimethoprim sulfamethoxazole. She does not admit to any fever or chills. Her CBGs apparently are at baseline however she is saying that she feels some discomfort on the lateral part of her ankle I looked over her last inflammatory markers from the summer 2020 at which time she had a deeply necrotic infected wound in this area. On 11/10/2018 her sedimentation rate was 56 and C-reactive  protein 9.9. This was 107 and 29 on 07/29/2018. 6/17; the patient had a necrotic wound the last time she was here on the right dorsal foot. After debridement I did a culture. This showed a very resistant ESBL Klebsiella as well as Enterococcus. Her x-ray of the foot which was  done because of warmth and some discomfort showed bone destruction within the carpal bones involving the navicular acute cuboid lateral middle cuneiforms but essentially unchanged from her prior study which was done on 10/29/2018. The findings were felt to represent chronic osteomyelitis. We did inflammatory markers on her. Her white count was 5.25 sedimentation rate 16 and C-reactive protein at 11.1. Notable for the fact that in August 2020 her CRP was 9.9 and sedimentation rate 56. I have looked at her x-rays. It is true that the bone destruction is very impressive however the patient came into this clinic for the wound on her right foot with pieces of bone literally falling out anteriorly with purulent material. I am not exactly sure I could have expected anything different. She has not been systemically unwell no fever chills or blood sugars have been reasonable. 6/28; she arrives with a right heel closed. The substantial area on the right anterior foot looks healthy. Much better looking surface. I think we can change to [facility] seems to help this previously. She is getting her antibiotics at dialysis she should be just about finished 7/9; changed to [facility] last week. Surface wound looks satisfactory not much change in surface area however. She is going to [place] state next week this is usually a difficult thing for this patient follow-up will be for 2 weeks. 7/23; using Hydrofera Blue. She returns from her trip and the wound looks surprisingly good. Usually when this patient goes on trips she comes back with a lot of problems with the wound. She is saying that she sometimes feels an episodic "crunching"  feeling on the lateral part of the foot. She is neuropathic and not feeling pain but wonders whether this could be a neuropathic dysesthesia. 11/13/19-Patient returns after 3 weeks, the wound itself is stable and patient states that there is nothing new going on she is on some extra anxiety medications and is resisting the temptation to pick at the dry skin around the wound. 9/20; patient has not been here in over a month and I have not seen her in 2 months. The wound in terms of size I think is about the same. There is no exposed bone. She has a nonviable surface on this. She is supposed to be using [facility] however she is also been using some form of honey preparation as well as a silver-based dressings. I do not think she has any pattern to this. 10/4; 2-week follow-up. Patient has been using some form of spray which she says has honey and silver to purchase this online she has been covering it with gauze. In spite of this the wound actually looks quite good. The deeper divot distally appears to be close down. There is a rim of epithelialization. 10/18; 2-week follow-up. Patient has been using her Hydrofera Blue covered with her silver honey spray that she got online. 11/1; 2-week follow-up. She is using Hydrofera Blue with a silver honey spray. Wound bed is measuring smaller. She has noticed that her foot is warmer on the right. She is concerned about infection. For a long period of time I had her on prophylactic trimethoprim sulfamethoxazole DS 1 tablet daily. She is asking for this to be restarted. The patient is walking on this foot because of repairs that are being done in a home her but her room is on the second floor she has to go up and down stairs. I have cautioned against this however as usual she will do exactly what  she wants to do 11/15; 2-week follow-up. She uses Hydrofera Blue with a silver/honey spray which I have never heard of. I think her wound looks about the same.  Some epithelialization. No evidence that this is infected. I think she is walking on this more than we agreed on. She is going on extensive vacation over Thanksgiving 12/3; 2-week follow-up. She is using Hydrofera Blue however over Thanksgiving she ran out of this and she is simply been using Medihoney. In spite of this her wound is smaller almost divided into 2 now. She traveled extensively over Thanksgiving and actually looks quite good in spite of this. Usually this is been a marker of problems for her 12/17; 2-week follow-up. She is using Hydrofera Blue. The wound is smaller. Debris on the surface of this is fibrinous. She is traveling to [place] over the holidays which never bodes well for her wounds. I think she is walking more on her feet then she is even willing to admit and she tells me she does walk 2/11; using Hydrofera Blue. Her wounds are contracting however she walks in the clinic with a history that she has not been able to eat she has had vomiting. She also has right eye problems. Her blood sugar was 567. She had blood work done at dialysis and we called there to get her blood work although it still had not returned although they should have this by the end of the day we were told. She also stated that she was not sure what her blood sugar was as her glucose monitor was not working and not coming to tomorrow. She also for some reason does not think her insulin pump is working well. Her endocrinologist is Dr. Elayne Snare 06/03/2020 upon evaluation today patient actually appears to be doing excellent in regard to her wound. In fact she tells me it was not even bleeding until she picked a dry piece of skin off while we were getting ready to come in and see her today. Fortunately there is no signs of active infection which is great news and overall very pleased. She is going to be seeing her neurologist sometime shortly I believe it might even be on Monday. Nonetheless there can proceed  with the work-up as far as anything else going on with her eye the fact that her right eyelid is drooping. 3/25; right medial dorsal foot. She has superficial areas here that appear to be fully epithelialized she is using Medihoney and silver alginate and some combination. She has a new area on the mid left foot at roughly the level of the fifth metatarsal base 4/84/8; right medial dorsal foot. Still superficial areas here. I cleaned up 1 of these today she has been using I think mostly Medihoney but I would like her to use silver alginate. The area on the mid part of her foot also looks improved on the left. Since she was last here she tells me that she noted pus in the area of her left foot. She told dialysis about this they did a culture and ultimately she is on IV antibiotics but were not sure which one. They referred her to [name] he said that he did not need to follow her. I have not really verified any of this and I do not know what antibiotic she is on at dialysis 4/29; patient has been using Medihoney to her wounds. She reports taking off the toenail to her left second toe. She states that home health discharged  her and she has not had help with dressing changes. She denies any signs and symptoms of infection. 5/13; 2-week follow-up. Miraculously the wound on the right foot dorsally is healed. She has an area on the tip of her left third toe and an area in the left midfoot. 5/26; patient presents for 2-week follow-up. She has 2 open wounds 1 to her plantar foot and the other to the left third toe. She has noticed increased swelling and redness to the toe wound. It is unclear exactly what she is doing for dressing changes as she has multiple dressing options at home. She states that it is easiest to do [facility] on the plantar wound and Prisma or Medihoney on the toe wound. 6/3; saw Dr. Maryjane Hurter week. She put her on Keflex because of the swelling I think of the left second toe. She  arrives today with new breakdown x3 on the dorsal right foot which is disappointing. She also complains of pain in the right ankle, 3 liquid stools per day, chills without fever 6/17; patient was in [place]. I think a little more ambulatory than I would like to hear. In any case she still has the 3 small open areas on the dorsal part of her foot on the right medial leg these look about the same as last time. She is supposed to be using Prisma but I think used Medihoney. She has an area on the left lateral plantar foot which had undermining under her skin and a blood blister with the 2 areas communicating today. And finally an area on the tip of her left second toe 6/30; patient has 3 small areas that look better than the last time on the right dorsal foot. She uses a combination of her products including collagen, Hydrofera Blue and the Medihoney nevertheless they look better. The area on the left midfoot might have been healed although she pulled some skin off superficial open area. The left second toe still does not look that healthy. Open areas on the tips of the 7/21; the areas on her right dorsal foot are still present although they look benign. We are using silver collagen although she may be using any of the products she had at home. The real problem here is the left second toe. Necrotic wound over the distal phalanx and distal interphalangeal joint. This probes directly to bone. She arrives in clinic today with an interesting story. She went to [place] last week. She became ill there with refractory nausea and vomiting. She went to an urgent care and then the ER. I am able to see parts of this in Care Everywhere. She had 2 blood cultures done both of which ultimately showed MRSA. This was sensitive to daptomycin and linezolid and vancomycin. Her white count was 4.8 hemoglobin 10.4. X-ray of the left foot showed prior resection of the left fifth metatarsal no evidence of osteomyelitis. Her  C-reactive protein on 10/13/2020 was 104.8. She refused admission to the hospital as she was to board her flight to return to [place]. She dialyzed this morning I am not certain why she did not bring this to their attention 7/27; fortunately the left second toe and the erythema in her left foot looks a lot better on the IV vancomycin at dialysis. I was able to verify earlier this week that she had indeed received it on the weekend starting last Saturday. She will need at least 4 weeks. I saw her left arm shunt and it looks fine.  We are using silver alginate on the toe and we have been using I think Medihoney on the right foot which is actually been her decision. She has 2 open wounds dorsally. She does not describe fever chills her blood sugars are erratic but seem to be at her baseline 8/18; she has completed her antibiotics at dialysis but is unsure how long this was ordered. I have asked her to check when she goes to dialysis on Saturday. She is using polymen on the right foot dorsally and silver collagen not silver alginate to the left second toe. Her blood pressure is high today but blood sugars have been stable according to her she is having no fever or systemic symptoms 9/14; about a month since we have seen her. In the interim she was admitted to hospital from 11/21/2020 through 12/01/2020 with delirium secondary to sepsis. She was felt to have sepsis from a left second toe MRSA infection. She is on IV vancomycin and I think will complete 6 weeks of this on 12/29/2020. Her blood cultures were negative. A transthoracic echo did not show any vegetations. I do not believe she had a TEE. She comes in today with a sizable wound on the right medial foot with some necrotic surface in the mid part of this and also a wound on the tip of her left second toe. She has been using Medihoney 10/18; I have not seen this woman in about a month. In the meantime her left second toe seems to have healed over  although likely that there has been bone or joint destruction in the distal part of the second toe. The area on the right lateral foot is worse. Our intake nurses noted green drainage. Not completely clear as usual what she is using here. She tells me that she did see ground-up some antibiotics likely cephalexin although I am not sure about this and she took 2 days worth. She had called some weeks ago about the amount of antibiotics she got for her MRSA sepsis. We referred her back to dialysis but she claims that she really only got a week of therapy. I find that hard to believe 11/15; 1 month follow-up. She comes in with a small wound on the right dorsal foot better than last time. As usual she is using some combination of Medihoney and silver alginate. She was traveling in [facility] but fortunately this time when she traveled to the wounds do not seem to have suffered. There is nothing open on the left foot 12/7; patient comes in today with a right dorsal foot has 2 small open areas. She is however running a fever with some warmth and discomfort on the medial aspect of the right leg to about the mid calf level. According the patient they are having trouble with her shunt in the left arm and apparently is going for some form of revision/reopening I am not sure. She thinks she has been running low-grade fevers at home perhaps in the 99 range. Her blood sugar was slightly over 200 today blood pressure quite high. She is not in a lot of pain but says that the area feels somewhat uncomfortable [has neuropathy] Electronic Signature(s) Signed: 03/08/2021 3:48:37 PM By: Linton Ham MD Entered By: Linton Ham on 03/08/2021 13:27:14 -------------------------------------------------------------------------------- Physical Exam Details Patient Name: Date of Service: [name] 03/08/2021 12:30 PM Medical Record Number: [id number] Patient Account Number: [id number] Date of Birth/Sex:  Treating RN: 09/10/1970 (50 y.o. F) Primary  Care Provider: [name] Other Clinician: Referring Provider: Treating Provider/Extender: Vernelle Emerald in Treatment: 131 Constitutional Patient is hypertensive.. Pulse regular and within target range for patient.Marland Kitchen Respirations regular, non-labored and within target range.. Temperature is normal and within the target range for the patient.Marland Kitchen Appears in no distress. Respiratory work of breathing is normal. Bilateral breath sounds are clear and equal in all lobes with no wheezes, rales or rhonchi.. Cardiovascular Midsystolic murmur no other findings. Dorsalis pedis pulses palpable on the right. Gastrointestinal (GI) No liver or spleen enlargement. Notes Wound exam; 2 small areas on the right dorsal foot medially these by themselves do not look ominous however she has warmth extending to the right medial calf. She is severely neuropathic therefore its difficult to reproduce pain however she seems to think it is uncomfortable. Her shunt in the left mid humeral area does not look ominous. Electronic Signature(s) Signed: 03/08/2021 3:48:37 PM By: Linton Ham MD Entered By: Linton Ham on 03/08/2021 13:29:09 -------------------------------------------------------------------------------- Physician Orders Details Patient Name: Date of Service: [name] 03/08/2021 12:30 PM Medical Record Number: [id number] Patient Account Number: [id number] Date of Birth/Sex: Treating RN: 1970-07-06 (50 y.o. [name], [name] Primary Care Provider: [name] Other Clinician: Referring Provider: Treating Provider/Extender: Vernelle Emerald in Treatment: [phone] Verbal / Phone Orders: No Diagnosis Coding Follow-up Appointments Return appointment in 3 weeks. - Dr. Arcola Jansky Shower/ Hygiene May shower and wash wound with soap and water. Edema Control - Lymphedema / SCD / Other Bilateral  Lower Extremities Elevate legs to the level of the heart or above for 30 minutes daily and/or when sitting, a frequency of: - throughout the day Avoid standing for long periods of time. Moisturize legs daily. - with dressing changes Off-Loading Open toe surgical shoe to: - to both feet Other: - minimal weight bearing right foot Wound Treatment Wound #52 - Foot Wound Laterality: Right Cleanser: Soap and Water Every Other Day/30 Days Discharge Instructions: May shower and wash wound with dial antibacterial soap and water prior to dressing change. Cleanser: Wound Cleanser (Generic) Every Other Day/30 Days Discharge Instructions: Cleanse the wound with wound cleanser prior to applying a clean dressing using gauze sponges, not tissue or cotton balls. Prim Dressing: MediHoney Gel, tube 1.5 (oz) Every Other Day/30 Days ary Discharge Instructions: Apply to wound bed as instructed Secondary Dressing: Woven Gauze Sponge, Non-Sterile 4x4 in (Generic) Every Other Day/30 Days Discharge Instructions: Apply over primary dressing as directed. Secondary Dressing: ABD Pad, 5x9 (Generic) Every Other Day/30 Days Discharge Instructions: Apply over primary dressing as directed. Secured With: Elastic Bandage 4 inch (ACE bandage) (Generic) Every Other Day/30 Days Discharge Instructions: Secure with ACE bandage as directed. Secured With: [employer], 4.5x3.1 (in/yd) (Generic) Every Other Day/30 Days Discharge Instructions: Secure with Kerlix as directed. Secured With: Paper Tape, 1x10 (in/yd) (Generic) Every Other Day/30 Days Discharge Instructions: Secure dressing with tape as directed. Wound #53 - Foot Wound Laterality: Right, Distal Cleanser: Soap and Water Every Other Day/30 Days Discharge Instructions: May shower and wash wound with dial antibacterial soap and water prior to dressing change. Cleanser: Wound Cleanser (Generic) Every Other Day/30 Days Discharge Instructions: Cleanse the wound with  wound cleanser prior to applying a clean dressing using gauze sponges, not tissue or cotton balls. Prim Dressing: MediHoney Gel, tube 1.5 (oz) Every Other Day/30 Days ary Discharge Instructions: Apply to wound bed as instructed Secondary Dressing: Woven Gauze Sponge, Non-Sterile 4x4 in (Generic) Every Other  Day/30 Days Discharge Instructions: Apply over primary dressing as directed. Secondary Dressing: ABD Pad, 5x9 (Generic) Every Other Day/30 Days Discharge Instructions: Apply over primary dressing as directed. Secured With: Elastic Bandage 4 inch (ACE bandage) (Generic) Every Other Day/30 Days Discharge Instructions: Secure with ACE bandage as directed. Secured With: [employer], 4.5x3.1 (in/yd) (Generic) Every Other Day/30 Days Discharge Instructions: Secure with Kerlix as directed. Secured With: Paper Tape, 1x10 (in/yd) (Generic) Every Other Day/30 Days Discharge Instructions: Secure dressing with tape as directed. Electronic Signature(s) Signed: 03/08/2021 3:48:37 PM By: Linton Ham MD Signed: 03/08/2021 4:34:42 PM By: Rhae Hammock RN Entered By: Rhae Hammock on 03/08/2021 13:18:02 -------------------------------------------------------------------------------- Problem List Details Patient Name: Date of Service: [name] 03/08/2021 12:30 PM Medical Record Number: [id number] Patient Account Number: [id number] Date of Birth/Sex: Treating RN: Feb 26, 1971 (50 y.o. F) Primary Care Provider: [name] Other Clinician: Referring Provider: Treating Provider/Extender: Vernelle Emerald in Treatment: 131 Active Problems ICD-10 Encounter Code Description Active Date MDM Diagnosis L97.514 Non-pressure chronic ulcer of other part of right foot with necrosis of bone 09/03/2018 No Yes E10.621 Type 1 diabetes mellitus with foot ulcer 09/24/2018 No Yes L97.529 Non-pressure chronic ulcer of other part of left foot with unspecified  severity 07/29/2020 No Yes L03.116 Cellulitis of left lower limb 03/08/2021 No Yes Inactive Problems ICD-10 Code Description Active Date Inactive Date M86.671 Other chronic osteomyelitis, right ankle and foot 09/03/2018 09/03/2018 L97.411 Non-pressure chronic ulcer of right heel and midfoot limited to breakdown of skin 09/17/2019 09/17/2019 L97.521 Non-pressure chronic ulcer of other part of left foot limited to breakdown of skin 04/20/2019 04/20/2019 L97.812 Non-pressure chronic ulcer of other part of right lower leg with fat layer exposed 02/24/2019 02/24/2019 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, right eye 05/13/2020 05/13/2020 L97.521 Non-pressure chronic ulcer of other part of left foot limited to breakdown of skin 06/24/2020 06/24/2020 M86.172 Other acute osteomyelitis, left ankle and foot 10/20/2020 10/20/2020 Resolved Problems ICD-10 Code Description Active Date Resolved Date L02.415 Cutaneous abscess of right lower limb 12/25/2018 12/25/2018 Electronic Signature(s) Signed: 03/08/2021 3:48:37 PM By: Linton Ham MD Signed: 03/08/2021 3:48:37 PM By: Linton Ham MD Entered By: Linton Ham on 03/08/2021 13:25:00 -------------------------------------------------------------------------------- Progress Note Details Patient Name: Date of Service: [name] 03/08/2021 12:30 PM Medical Record Number: [id number] Patient Account Number: [id number] Date of Birth/Sex: Treating RN: 1970-10-13 (50 y.o. F) Primary Care Provider: [name] Other Clinician: Referring Provider: Treating Provider/Extender: Vernelle Emerald in Treatment: 131 Subjective History of Present Illness (HPI) When71 year old diabetic who is known to have type 1 diabetes which is poorly controlled last hemoglobin A1c was 11%. She comes in with a ulcerated area on the left lateral foot which has been there for over 6 months. Was recently she has been treated by [name] of podiatry who saw  her last on 05/28/2016. Review of his notes revealed that the patient had incision and drainage with placement of antibiotic beads to the left foot on 04/11/2016 for possible osteomyelitis of the cuboid bone. Over the last year she's had a history of amputation of the left fifth toe and a femoropopliteal popliteal bypass graft somewhere in April 2017. 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 His note [name] mentions that the patient has been referred to me for further wound care and possibly great candidate for hyperbaric oxygen therapy due to recurrent osteomyelitis. However we do not have any x-rays of biopsy reports  confirming this. He has been on several antibiotics including Bactrim and most recently is on doxycycline for an MRSA. I understand, the patient was not a candidate for IV antibiotics as she has had previous PICC lines which resulted in blood clots in both arms. There was a x-ray report dated 04/04/2016 on [name] notes which showed evidence of fifth ray resection left foot with osteolytic changes noted to the fourth metatarsal and cuboid bone on the left. 06/13/2016 -- had a left foot x-ray which showed no acute fracture or dislocation and no definite radiographic evidence of osteomyelitis. Advanced osteopenia was seen. 06/20/2016 -- she has noticed a new wound on the right plantar foot in the region where she had a callus before. 06/27/16- the patient did have her x-ray of the right foot which showed no findings to suggest osteomyelitis. She saw her endocrinologist, [name], yesterday. Her A1c in January was 11. He also indicates mismanagement and noncompliance regarding her diabetes. She is currently on Bactrim for a lip infection. She is complaining of nausea, vomiting and diarrhea. She is unable to articulate the exact orders or dosing of the Bactrim; it is unclear when she will complete this. 07/04/2016 -- results from [facility] of ABIs with ankle  waveforms were noted from 02/14/2016. The examination done on 06/27/2015 showed noncompressible ABIs with the right being 1.45 and the left being 1.33. The present examination showed a right ABI of 1.19 on the left of 1.33. The conclusion was that right normal ABI in the lower extremity at rest however compared to previous study which was noncompressible ABI may be falsely elevated side suggesting medial calcification. The left ABI suggested medial calcification. 08/01/2016 -- the patient had more redness and pain on her right foot and did not get to come to see as noted she see her PCP or go to the ER and decided to take some leftover metronidazole which she had at home. As usual, the patient does report she feels and is rather noncompliant. 08/08/2016 -- -- x-ray of the right foot -- FINDINGS:Transmetatarsal amputation is noted. No bony destruction is noted to suggest osteomyelitis. IMPRESSION: No evidence of osteomyelitis. Postsurgical changes are seen. MRI would be more sensitive for possible bony changes. Culture has grown Serratia Marcescens -- sensitive to Bactrim, ciprofloxacin, ceftazidime she was seen by [name] on 08/06/2016. He did not find any exposed bone, muscle, tendon, ligament or joint. There was no malodor and he did a excisional debridement in the office. ============ Old notes: 50 year old patient who is known to the wound clinic for a while had been away from the wound clinic since 09/01/2014. Over the last several months she has been admitted to various hospitals including [facility]. She was treated for a right metatarsal osteomyelitis with a transmetatarsal amputation and this was done about 2 months ago. He has a small ulcerated area on the right heel and she continues to have an ulcerated area on the left plantar aspect of the foot. The patient was recently admitted to the [facility] hospital group between 7/12 and 10/18/2014. she was given 3 weeks of IV  vancomycin and was to follow-up with her surgeons at [facility] and also took oral vancomycin for C. difficile colitis. 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  MRI of the left foot done on 10/14/2014 showed no abscess or osteomyelitis. 04/27/15; this is a patient we know from previous stays in the wound care center. She is a type I diabetic I am not sure of her control currently. Since the last time I saw her she is had a right transmetatarsal amputation and has no wounds on her right foot and has no open wounds. She is been followed at the wound care center at [facility] in [place]. She comes today with the desire to undergo hyperbaric treatment locally. Apparently one of her wound care providers in [place] has suggested hyperbarics. This is in response to an MRI from 04/18/15 that showed increased marrow signal and loss of the proximal fifth metatarsal cortex evidence of osteomyelitis with likely early osteomyelitis in the cuboid bone as well. She has a large wound over the base of the fifth metatarsal. She also has a eschar over her the tips of her toes on 1,3 and 5. She does not have peripheral pulses and apparently is going for an angiogram tomorrow which seems reasonable. After this she is going to infectious disease at [facility]. They have been using Medihoney to the large wound on the lateral aspect of the left foot to. The patient has known Charcot deformity from diabetic neuropathy. She also has known diabetic PAD. Surprisingly I can't see that she has had any recent antibiotics, the patient states the last antibiotic she had was at the end of November for 10 days. I think this was in  response to culture that showed group G strep although I'm not exactly sure where the culture was from. She is also had arterial studies on 03/29/15. This showed a right ABI of 1.4 that was noncompressible. Her left ABI was 0.73. 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 It was not felt that arterial inflow was adequate for healing of a foot ulcer. Her Doppler waveforms looked monophasic ===== READMISSION 02/28/17; this is in an now 50 year old woman we've had at several different occasions in this clinic. 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 She has a remote ex-smoker. She was last seen in this clinic by Dr. Con Memos I think in May. More recently she is been followed by her podiatrist [name] an infectious disease Dr. Megan Salon. She has 2 open wounds the major one is over the right first metatarsal head she also has a wound on the left plantar foot. an MRI of the right foot on 01/01/17 showed a soft tissue ulcer along the plantar aspect of the first metatarsal base consistent with osteomyelitis of the first metatarsal stump. Dr. 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 According to the patient this is been open for slightly over a month. She has been on a combination of Cipro 500 twice a day, Zyvox 600 twice a day and Flagyl 500 3 times a day for over a month now as directed by Dr. Megan Salon. cultures of the right foot earlier this year showed MRSA in January and Serratia in May. January also had a few viridans strep. Recent x-rays of both feet were done and [name] office and I don't have these reports. The patient has known PAD and has a history of aleft femoropopliteal bypass in April 2017. She underwent a right TMA in June 2016 and a left fifth ray amputation in April 2017 the patient has an insulin pump and  she works closely with her endocrinologist Dr.  Dwyane Dee. In spite of this the last hemoglobin A1c I can see is 10.1 on 01/01/2017. She is being referred by 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 She is been using Medihoney to this area and also an area on the left midfoot. She is using healing sandals bilaterally. ABIs in this clinic at the left posterior tibial was 1.1 noncompressible on the right READMISSION Non invasive vascular NOVANT 5/18 Aftercare following surgery of the circulatory system Procedure Note - Interface, External Ris In - 08/13/2016 11:05 AM EDT Procedure: Examination consists of physiologic resting arterial pressures of the brachial and ankle arteries bilaterally with continuous wave Doppler waveform analysis. Previous: Previous exam performed on 02/14/16 demonstrated ABIs of Rt = 1.19 and Lt = 1.33. Right: ABI = non-compressible PT 1.47 DP. S/P transmet amputation. , Left: ABI = 1.52, 2nd digit pressure = 87 mmHg Conclusions: Right: ABI (>1.3) may be falsely elevated, suggesting medial calcification. Left: ABI (>1.3) may be falsely elevated, suggesting medial calcification The patient is a now 50 year old type I diabetic is had multiple issues her graded to chronic diabetic foot ulcers. 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 We had her in the clinic lastin November. At that point she had wounds on her bilateral feet.she had wanted to try hyperbarics however the healogics review process denied her because she hadn't followed up with her vascular surgeon for her left femoropopliteal bypass. The bypass was done by Dr. Raul Del at [facility]. We made her a follow-up with Dr. Raul Del however she did not keep the appointment and therefore she was not approved The patient shows me a small wound on her left fourth  metatarsal head on her phone. She developed rapid discoloration in the plantar aspect of the left foot and she was admitted to hospital from 2/2 through 05/10/17 with wet gangrene of the left foot osteomyelitis of the fourth metatarsal heads. She was admitted acutely ill with a temperature of 103. She was started on broad-spectrum vancomycin and cefepime. On 05/06/17 she was taken to the OR by [name] her podiatric surgeon for an incision and drainage irrigation of the left foot wound. Cultures from this surgery revealed group be strep and anaerobes. she was seen by Dr.Xu of orthopedic surgery and scheduled for a below-knee amputation which she u refused. Ultimately she was discharged on Levaquin and Flagyl for one month. MRI 05/05/17 done while she was in the hospital showed abscess adjacent to the fourth metatarsal head and neck small abscess around the fourth flexor tendon. Inflammatory phlegmon and gas in the soft tissues along the lateral aspect of the fourth phalanx. Findings worrisome for osteomyelitis involving the fourth proximal and middle phalanx and also the third and fourth metatarsals. Finally the patient had actually shortly before this followed up with Dr. Raul Del at no time on 04/29/17. He felt that her left femoropopliteal bypass was patent he felt that her left-sided toe pressures more than adequate for healing a wound on the left foot. This was before her acute presentation. 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 05/28/17; she is started hyperbarics. The patient tells me that for some reason she was not actually on Levaquin but I think on ciprofloxacin. She was on Flagyl. She only started her Levaquin yesterday due to some difficulty with the pharmacy and perhaps  her sister picking it up. She has an appointment with [name] tomorrow and with infectious disease early next week. She has no new complaints 06/06/17; the patient continues in hyperbarics. She saw [name] on 05/29/17 who  is her podiatric surgeon. He is elected for a transmetatarsal amputation on 06/27/17. I'm not sure at what level he plans to do this amputation. The patient is unaware ooShe also saw Dr. Megan Salon of infectious disease who elected to continue her on current antibiotics I think this is ciprofloxacin and Flagyl. I'll need to clarify with her tomorrow if she actually has this. We're using silver alginate to the actual wound. Necrotic surface today with material under the flap of her foot. ooOriginal MRI showed abscesses as well as osteomyelitis of the proximal and middle fourth phalanx and the third and fourth metatarsal heads 06/11/17; patient continues in hyperbarics and continues on oral antibiotics. She is doing well. The wound looks better. The necrotic part of this under the flap in her superior foot also looks better. she is been to see [name]. I haven't had a chance to look at his note. Apparently he has put the transmetatarsal amputation on hold her request it is still planning to take her to the OR for debridement and product application ACEL. I'll see if I can find his note. I'll therefore leave product ordering/requests to [name] for now. I was going to look at Dermagraft 06/18/17-she is here in follow-up evaluation for bilateral foot wounds. She continues with hyperbaric therapy. She states she has been applying manuka honey to the right plantar foot and alternate manuka honey and silver alginate to the left foot, despite our orders. We will continue with same treatment plan and she will follo up next week. 06/25/17; I have reviewed [name] last note from 3/11. She has operative debridement in 2 days' time. By review his note apparently they're going to place there is skin over the majority of this wound which is a good choice. She has a small satellite area at the most proximal part of this wound on the left plantar foot. The area on the right plantar foot we've been using silver  alginate and it is close to healing. 07/02/17; unfortunately the patient was not easily approved for [name] proposed surgery. I'm not completely certain what the issue is. She has been using silver alginate to the wound she has completed a first course of hyperbarics. She is still on Levaquin and Flagyl. I have really lost track of the time course here.I suspect she should have another week to 2 of antibiotics. I'll need to see if she is followed up with infectious disease Dr. Megan Salon 07/09/17; the patient is followed up with Dr. Megan Salon. 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 She continues on Levaquin and metronidazole continuing both of these for now I think she is been on fr about 6 weeks. She still has some drainage but no pain. No fever. Her had been plans for her to go to the OR for operative debridement with her podiatrist [name], I am not exactly sure where that is. I'll probably slip a note to [name] today. I note that she follows with Dr. Dwyane Dee of endocrinology. We have her recertified for hyperbaric oxygen. I have not heard about Dermagraft however I'll see if [name] is planning a skin substitute as well 07/16/17; the patient tells me she is just about out of  Levaquin. I'll need to check Dr. Hale Bogus last notes on this. She states she has plenty of Flagyl however. She comes in today complaining of pain in the right lateral foot which she said lasted for about a day. The wound on the right foot is actually much more medially. She also tells me that the [facility] cost a lot of pain in the left foot wound and she turned back to silver alginate. Finally Dermagraft has a $194 per application co-pay. She cannot afford this 07/23/17; patient arrives today with the wound not much smaller. There is not much new to add. She has not heard from [name] all try to put in a call to them today. She was asking about Dermagraft again and she has  an over $174 per application co-pay she states that she would be willing to try to do a payment plan. I been tried to avoid this. We've been using silver alginate, I'll change to [facility] 07/30/17-She is here in follow-up evaluation for left foot ulcer. She continues hyperbaric medicine. The left foot ulcer is stable we will continue with same treatment plan 08/06/17; she is here for evaluation of her left foot ulcer. Currently being treated for hyperbarics or underlying osteomyelitis. She is completed antibiotics. The left foot ulcer is better smaller with healthier looking granulation. For various reasons I am not really clear on we never got her back to the OR with [name]. He did not respond to my secure text message. Nevertheless I think that surgery on this point is not necessary nor am I completely clear that a skin substitute is necessary The patient is complaining about pain on the outside of her right foot. 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 There is no erythema. She also states the foot is warm versus her other part of her upper leg and this is largely true. It is not totally clear to me what's causing this. She thinks it's different from her usual neuropathy pain 08/13/17; she arrives in clinic today with a small wound which is superficial on her right first metatarsal head. 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 She tells [place] she was up on her feet over the Mother's Day celebration. ooThe large wound is on the left foot. Continues with hyperbarics for underlying osteomyelitis. We're using Hydrofera Blue. She asked me today about where we were with Dermagraft. I had actually excluded this because of the co-pay however she wants to assume this therefore I'll recheck the co-pay an order for next week. 08/20/17; the patient agreed to accept the co-pay of the first Apligraf which we applied today. She is disappointed she is finishing hyperbarics  will run this through the insurance on the extent of the foot infection and the extent of the wound that she had however she is already had 60 dive's. Dermagraft No. 1 08/27/17; Dermagraft No. 2. She is not eligible for any more hyperbaric treatments this month. She reports a fair amount of drainage and she actually changed to the external dressings without disturbing the direct contact layer 09/03/17; the patient arrived in clinic today with the wound superficially looking quite healthy. Nice vibrant red tissue with some advancing epithelialization although not as much adherence of the flap as I might like. However she noted on her own fourth toe some bogginess and she brought that to our attention. Indeed this was boggy feeling like a possibility of subcutaneous fluid. She stated that this was similar to how an  issue came up on the lateral foot that led to her fifth ray amputation. She is not been unwell. We've been using Dermagraft 09/10/17; the culture that I did not last week was MRSA. She saw Dr. Megan Salon this morning who is going to start her on vancomycin. I had sent him a secure a text message yesterday. I also spoke with her podiatric surgeon [name] about surgery on this foot the options for conserving a functional foot etc. Promised me he would see her and will make back consultation today. Paradoxically her actual wound on the plantar aspect of her left foot looks really quite good. I had given her 5 days worth of Baxdella to cover her for MRSA. Her MRI came back showing osteomyelitis within the third metatarsal shaft and head and base of the third and fourth proximal phalanx. She had extensive inflammatory changes throughout the soft tissue of the lateral forefoot. With an ill-defined fluid around the fourth metatarsal extending into the plantar and dorsal soft tissues 09/19/17; the patient is actually on oral Septra and Flagyl. She apparently refused IV vancomycin. She also saw [name]  at my request who is planning her for a left BKA sometime in mid July. MRI showed osteomyelitis within the third metatarsal shaft and head and the basis of the third and fourth proximal phalanx. I believe there was felt to be possible septic arthritis involving the third MTP. 09/26/17; the patient went back to Dr. Megan Salon at my suggestion and is now receiving IV daptomycin. Her wound continues to look quite good making the decision to proceed with a transmetatarsal amputation although more difficult for the patient. I believe in my extensive discussions with her she has a good sense of the pros and cons of this. I don't NV the tuft decision she has to make. She has an appointment with [name] I believe in mid July and I previously spoken to him about this issue Has we had used 3 previous Dermagraft. Given the condition of the wound surface I went ahead and added the fourth one today area and I did this not fully realizing that she'll be traveling to [place] next week. I'm hopeful she can come back in 2 weeks 10/21/17; Her same Dermagraft on for about 3-1/2 weeks. In spite of this the wound arrives looking quite healthy. There is been a lot of healing dimensions are smaller. Looking at the square shaped wound she has now there is some undermining and some depth medially under the undermining although I cannot palpate any bone. No surrounding infection is obvious. She has difficult questions about how to look at this going forward vis--vis amputations versus continued medical therapy. T be truthful the wound is looks so o healthy and it is continued to contract. Hard to justify foot surgery at this point although I still told her that I think it might come to that if we are not able to eradicate the underlying MRSA. She is still highly at risk and she understands this 11/06/17 on evaluation today patient appears to be doing better in regard to her foot ulcer. She's been tolerating the dressing changes  without complication. Currently she is here for her Dermagraft #6. Her wound continues to make excellent progress at this point. She does not appear to have any evidence of infection which is good news. 11/13/17 on evaluation today patient appears to be doing excellent at this time. She is here for repeat Dermagraft application. This is #7. Overall her wound seems  to be making great progress. 12/05/17; the patient arrives with the wound in much better condition than when I last saw this almost 6 weeks ago. She still has a small probing area in the left metatarsal head region on the lateral aspect of her foot. We applied her last Dermagraft today. ooSince the last time she is here she has what appears to a been a blood blister on the plantar aspect of left foot although I don't see this is threatening. There is also a thick raised tissue on the right mid metatarsal head region. This was not there I don't think the last time she was here 3 weeks ago. 12/12/17; the patient continues to have a small programming area in the left metatarsal head region on the lateral aspect of her foot which was the initial large surgical wound. I applied her last Apligraf last week. I'm going to use Endoform starting today ooUnfortunately she has an excoriated area in the left mid foot and the right mid foot. The left midfoot looks like a blistered area this was not opened last week it certainly is open today. Using silver alginate on these areas. She promises me she is offloading this. 12/19/17; the small probing area in the left metatarsal head eyes think is shallower. In general her original wound looks better. We've been using Endoform. The area inferiorly that I think was trauma last week still requires debridement a lot of nonviable surface which I removed. She still has an open open area distally in her foot ooSimilarly on the right foot there is tightly adherent surface debris which I removed. Still areas that don't  look completely epithelialized. This is a small open area. We used silver alginate on these areas 12/26/2017; the patient did not have the supplies we ordered from last week including the Endoform. The original large wound on the left lateral foot looks healthy. She still has the undermining area that is largely unchanged from last week. She has the same heavily callused raised edged wounds on the right mid and left midfoot. Both of these requiring debridement. We have been using silver alginate on these areas 01/02/2018; there is still supply issues. We are going to try to use Prisma but I am not sure she actually got it from what she is saying. She has a new open area on the lateral aspect of the left fourth toe [previous fifth ray amputation]. Still the one tunneling area over the fourth metatarsal head. The area is in the midfoot bilaterally still have thick callus around them. She is concerned about a raised swelling on the lateral aspect of the foot. However she is completely insensate 01/10/2018; we are using Prisma to the wounds on her bilateral feet. Surprisingly the tunneling area over the left fourth metatarsal head that was part of her original surgery has closed down. She has a small open area remaining on the incision line. 2 open areas in the midfoot. 02/10/2018; the patient arrives back in clinic after a month hiatus. She was traveling to visit family in [place]. Is fairly clear she was not offloading the areas on her feet. The original wound over the left lateral foot at the level of metatarsal heads is reopened and probes medially by about a centimeter or 2. She notes that a week ago she had purulent drainage come out of an area on the left midfoot. Paradoxically the worst area is actually on the right foot is extensive with purulent drainage. We will use silver alginate  today 02/17/2018; the patient has 3 wounds one over the left lateral foot. She still has a small area over the  metatarsal heads which is the remnant of her original surgical wound. This has medial probing depth of roughly 1.4 cm somewhat better than last week. The area on the right foot is larger. We have been using silver alginate to all areas. The area on the right foot and left foot that we cultured last week showed both Klebsiella and Proteus. Both of these are quinolone sensitive. The patient put her's self on Bactrim and Flagyl that she had left hanging around from prior antibiotic usages. She was apparently on this last week when she arrived. I did not realize this. Unfortunately the Bactrim will not cover either 1 of these organisms. We will send in Cipro 500 twice daily for a week 03/04/2018; the patient has 2 wounds on the left foot one is the original wound which was a surgical wound for a deep DFU. At one point this had exposed bone. She still has an area over the fourth metatarsal head that probes about 1.4 cm although I think this is better than last week. I been using silver nitrate to try and promote tissue adherence and been using silver alginate here. ooShe also has an area in the left midfoot. This has some depth but a small linear wound. Still requiring debridement. ooOn the right midfoot is a circular wound. A lot of thick callus around this area. ooWe have been using silver alginate to all wound areas ooShe is completed the ciprofloxacin I gave her 2 weeks ago. 03/11/2018; the patient continues to have 2 open areas on the left foot 1 of which was the original surgical wound for a deep DFU. Only a small probing area remains although this is not much different from last week we have been using silver alginate. The other area is on the midfoot this is smaller linear but still with some depth. We have been using silver alginate here as well ooOn the right foot she has a small circular wound in the mid aspect. This is not much smaller than last time. We have been using silver alginate  here as well 03/18/2018; she has 3 wounds on the left foot the original surgical wound, a very superficial wound in the mid aspect and then finally the area in the mid plantar foot. She arrives in today with a very concerning area in the wound in the mid plantar foot which is her most proximal wound. There is undermining here of roughly 1-1/2 cm superiorly. Serosanguineous drainage. She tells me she had some pain on for over the weekend that shot up her foot into her thigh and she tells me that she had a nodule in the groin area. ooShe has the single wound in the right foot. ooWe are using endoform to both wound areas 03/24/2018; the patient arrives with the original surgical wound in the area on the left midfoot about the same as last week. There is a collection of fluid under the surface of the skin extending from the surgical wound towards the midfoot although it does not reach the midfoot wound. The area on the right foot is about the same. Cultures from last week of the left midfoot wound showed abundant Klebsiella abundant Enterococcus faecalis and moderate methicillin resistant staph I gave her Levaquin but this would have only covered the Klebsiella. She will need linezolid 04/01/2018; she is taking linezolid but for the first few days  only took 1 a day. I have advised her to finish this at twice daily dosing. In any case all of her wounds are a lot better especially on the left foot. The original surgical wound is closed. The area on the left midfoot considerably smaller. The area on the right foot also smaller. 04/08/2018; her original surgical wound/osteomyelitis on the left foot remains closed. She has area on the left foot that is in the midfoot area but she had some streaking towards this. This is not connected with her original wound at least not visually. ooSmall wound on the right midfoot appears somewhat smaller. 04/15/18; both wounds looks better. Original wound is better left  midfoot. Using silver alginate 1/21; patient states she uses saltwater soak in, stones or remove callus from around her wounds. She is also concerned about a blood blister she had on the left foot but it simply resolved on its own. We've been using silver alginate 1/28; the patient arrives today with the same streaking area from her metatarsals laterally [the site of her original surgical wound] down to the middle of her foot. There is some drainage in the subcutaneous area here. This concerns me that there is actually continued ongoing infection in the metatarsals probably the fourth and third. This fixates an MRI of the foot without contrast [chronic renal failure] ooThe wound in the mid part of the foot is small but I wonder whether this area actually connects with the more distal foot. ooThe area on the right midfoot is probably about the same. Callus thick skin around the small wound which I removed with a curette we have been using silver alginate on both wound areas 2/4; culture I did of the draining site on the left foot last time grew methicillin sensitive staph aureus. MRI of the left foot showed interval resolution of the findings surrounding the third metatarsal joint on the prior study consistent with treated osteomyelitis. Chronic soft tissue ulceration in the plantar and lateral aspect of the forefoot without residual focal fluid collection. No evidence of recurrent osteomyelitis. 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 She tells me she had nausea and vomiting over the weekend and missed 2 days. In general her foot looks much the same. She has a small open area just below the left fourth metatarsal head. A linear area in the left midfoot.  Some discoloration extending from the inferior part of this into the left lateral foot although this appears to be superficial. She has a small area on the right midfoot which generally looks smaller after debridement 2/18; the patient is completing his cephalexin and has another 2 days. She continues to have open areas on the left and right foot. 2/25; she is now off antibiotics. The area on the left foot at the site of her original surgical wound has closed yet again. She still has open areas in the mid part of her foot however these appear smaller. The area on the right mid foot looks about the same. We have been using silver alginate She tells me she had a serious hypoglycemic spell at home. She had to have EMS called and get IV dextrose 3/3; disappointing on the left lateral foot large area of necrotic tissue surrounding the linear area.  This appears to track up towards the same original surgical wound. Required extensive debridement. The area on the right plantar foot is not a lot better also using silver 3/12; the culture I did last time showed abundant enterococcus. I have prescribed Augmentin, should cover any unrecognized anaerobes as well. In addition there were a few MRSA and Serratia that would not be well covered although I did not want to give her multiple antibiotics. She comes in today with a new wound in the right midfoot this is not connected with the original wound over her MTP a lot of thick callus tissue around both wounds but once again she said she is not walking on these areas 3/17-Patient comes in for follow-up on the bilateral plantar wounds, the right midfoot and the left plantar wound. Both these are heavily callused surrounding the wounds. We are continuing to use silver alginate, she is compliant with offloading and states she uses a wheelchair fairly often at home 3/24; both wound areas have thick callus. However things actually look quite a bit better here for the  majority of her left foot and the right foot. 3/31; patient continues to have thick callused somewhat irritated looking tissue around the wounds which individually are fairly superficial. There is no evidence of surrounding infection. We have been using silver alginate however I change that to [facility] today 4/17; patient returns to clinic after having a scare with Covid she tested negative in her primary doctor's office. She has been using Hydrofera Blue. She does not have an open area on the right foot. On the left foot she has a small open area with the mid area not completely viable. She showed me pictures of what looks like a hemorrhagic blister from several days ago but that seems to have healed over this was on the lateral left foot 4/21; patient comes in to clinic with both her wounds on her feet closed. However over the weekend she started having pain in her right foot and leg up into the thigh. She felt as though she was running a low-grade fever but did not take her temperature. She took a doxycycline that she had leftover and yesterday a single Septra and metronidazole. She thinks things feel somewhat better. 4/28; duplex ultrasound I ordered last week was negative for DVT or superficial thrombophlebitis. She is completed the doxycycline I gave her. States she is still having a lot of pain in the right calf and right ankle which is no better than last week. She cannot sleep. She also states she has a temperature of up to 101, coughing and complaining of visual loss in her bilateral eyes. Apparently she was tested for Covid 2 weeks ago at [facility] and that was negative. Readmission: 09/03/18 patient presents back for reevaluation after having been evaluated at the end of April regarding erythema and swelling of her right lower extremity. Subsequently she ended up going to the hospital on 07/29/18 and was admitted not to be discharged until 08/08/18. Unfortunately it was noted during the time  that she was in the hospital that she did have methicillin-resistant Staphylococcus aureus as the infection noted at the site. It was also determined that she did have osteomyelitis which appears to be fairly significant. She was treated with vancomycin and in fact is still on IV vancomycin at dialysis currently. This is actually slated to continue until 09/12/18 at least which will be the completion of the six weeks of therapy. Nonetheless based on what I'm seeing  at this point I'm not sure she will be anywhere near ready to discontinue antibiotics at that time. Since she was released from the hospital she was seen by [name] who is her podiatrist on 08/27/18. His note specifically states that he is recommended that the patient needs of one knee amputation on the right as she has a life- threatening situation that can lead quickly to sepsis. The patient advised she would like to try to save her leg to which [name] apparently told her that this was against all medical advice. She also want to discontinue the Wound VAC which had been initiated due to the fact that she wasn't pleased with how the wound was looking and subsequently she wanted to pursue applying Medihoney at that time. He stated that he did not believe that the right lower extremity was salvageable and that the patient understood but would still like to attempt hyperbaric option therapy if it could be of any benefit. She was therefore referred back to [place] for further evaluation. He plans to see her back next week. Upon inspection today patient has a significant amount purulent drainage noted from the wound at this point. The bone in the distal portion of her foot also appears to be extremely necrotic and spongy. When I push down on the bone it bubbles and seeps purulent drainage from deeper in the end of the foot. I do not think that this is likely going to heal very well at all and less aggressive surgical debridement were undertaken  more than what I believe we can likely do here in our office. 09/12/2018; I have not seen this patient since the most recent hospitalization although she was in our clinic last week. I have reviewed some of her records from a complex hospitalization. 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 There is situation was complicated by MRSA bacteremia and acute on chronic renal failure now on dialysis. She is receiving vancomycin at dialysis. We started her on Dakin's wet-to-dry last week she is changing this daily. There is still purulent drainage coming out of her foot. Although she is apparently "agreeable" to a below-knee amputation which is been suggested by multiple clinicians she wants this to be done in [place]. She apparently has a telehealth visit with that provider sometime in late [place] 6/24. I have told her I think this is probably too long. Nevertheless I could not convince her to allow a local doctor to perform BKA. 09/19/2018; the patient has a large necrotic area on the right anterior foot. She has had previous transmetatarsal amputations. Culture I did last week showed MRSA nothing else she is on vancomycin at dialysis. She has continued leaking purulent drainage out of the distal part of the large circular wound on the right anterior foot. She apparently went to see Dr. 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 Somehow that translated into her being referred to plastic surgery for debridement of the area. I gather she basically refused amputation although I do not have a copy of Dr. Berenice Primas notes. The patient really wants to have a trial of hyperbaric oxygen. I agreed with initial assessment in this clinic that this was probably too far along to benefit however if she is going to have plastic surgery I think she would benefit from ancillary hyperbaric oxygen. The issue here is that the patient has benefited  as maximally as any patient I have ever seen from  hyperbaric oxygen therapy. Most recently she had exposed bone on the lateral part of her left foot after a surgical procedure and that actually has closed. She has eschared areas in both heels but no open area. She is remained systemically well. I am not optimistic that anything can be done about this but the patient is very clear that she wants an attempt. The attempt would include a wound VAC further debridements and hyperbaric oxygen along with IV antibiotics. 6/26; I put her in for a trial of hyperbaric oxygen only because of the dramatic response she has had with wounds on her left midfoot earlier this year which was a surgical wound that went straight to her bone over the metatarsal heads and also remotely the left third toe. We will see if we can get this through our review process and insurance. She arrives in clinic with again purulent material pouring out of necrotic bone on the top of the foot distally. There is also some concerning erythema on the front of the leg that we marked. It is bit difficult to tell how tender this is because of neuropathy. I note from infectious disease that she had her vancomycin extended. 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 She had the wound VAC on for part of the week. The rest of the time she is putting various things on this including Medihoney, "ionized water" silver sorb gel etc. 7/7; follow-up along with HBO. She is still on vancomycin at dialysis. She has a large open area on the dorsal right foot and a small dark eschar area on her heel. There is a lot less erythema in the area and a lot less tenderness. From an infection point of view I think this is better. She still has a lot of necrosis in the remaining right forefoot [previous TMA] we are still using the wound VAC in this area 7/16; follow-up along with HBO. I put her on linezolid after she finished her  vancomycin. We started this last Friday I gave her 2 weeks worth. I had the expectation that she would be operatively debrided by Dr. Marla Roe but that still has not happened yet. Patient phoned the office this week. She arrives for review today after HBO. The distal part of this wound is completely necrotic. Nonviable pieces of tendon bone was still purulent drainage. Also concerning that she has black eschar over the heel that is expanding. I think this may be indicative of infection in this area as well. She has less erythema and warmth in the ankle and calf but still an abnormal exam 7/21 follow-up along with HBO. I will renew her linezolid after checking a CBC with differential monitoring her blood counts especially her platelets. She was supposed to have surgery yesterday but if I am reading things correctly this was canceled after her blood sugar was found to be over 500. I thought Dr. Marla Roe who called me said that they were sending her to the ER but the patient states that was not the case. 7/28. Follow-up along with HBO. She is on linezolid I still do not have any lab work from dialysis even though I called last week. The patient is concerned about an area on her left lateral foot about the level of the base of her fifth metatarsal. I did not really see anything that ominous here however this patient is in [place] ability to point out problems that she is sensing and she has been accurate in  the past Finally she received a call from Dr. Marla Roe who is referring her to another orthopedic surgeon stating that she is too booked up to take her to the operating room now. Was still using a wound VAC on the foot 8/3 -Follow-up after HBO, she is got another week of linezolid, she is to call [facility] for an appointment, x-rays of both feet were reviewed, the left foot x-ray with third MTP joint osteo- Right foot x-ray widespread osteo-in the right midfoot Right ankle x-ray does not show any active  evidence of infection 8/11-Patient is seen after HBO, the wounds on the right foot appear to be about the same, the heel wound had some necrotic base over tendon that was debrided with a curette 8/21; patient is seen after HBO. The patient's wound on her dorsal foot actually looks reasonably good and there is substantial amount of epithelialization however the open area distally still has a lot of necrotic debris partially bone. I cannot really get a good sense of just how deep this probes under the foot. She has been pressuring me this week to order medical maggots through a company in [place] for her. The problem I have is there is not a defined wound area here. On the positive side there is no purulence. She has been to see infectious disease she is still on Septra DS although I have not had a chance to review their notes 8/28; patient is seen in conjunction with HBO. The wounds on her foot continued to improve including the right dorsal foot substantially the, the distal part of this wound and the area on the right heel. We have been using a wound VAC over this chronically. She is still on trimethoprim as directed by infectious disease 9/4; patient is seen in conjunction with HBO. Right dorsal foot wound substantially anteriorly is better however she continues to have a deep wound in the distal part of this that is not responding. We have been using silver collagen under border foam ooArea on the right plantar medial heel seems better. We have been using Hydrofera Blue 12/12/18 on evaluation today patient appears to be doing about the same with regard to her wound based on prior measurements. She does have some necrotic tissue noted on the lateral aspect of the wound that is going require a little bit of sharp debridement today. This includes what appears to be potentially either severely necrotic bone or tendon. Nonetheless other than that she does not appear to have any severe infection which  is good news 9/18; it is been 2 weeks since I saw this wound. She is tolerating HBO well. Continued dramatic improvement in the area on the right dorsal foot. She still has a small wound on the heel that we have been using Hydrofera Blue. She continues with a wound VAC 9/24; patient has to be seen emergently today with a swelling on her right lateral lower leg. She says that she told Dr. Evette Doffing about this and also myself on a couple of occasions but I really have no recollection of this. She is not systemically unwell and her wound really looked good the last time I saw this. She showed this to providers at dialysis and she was able to verify that she was started on cephalexin today for 5 doses at dialysis. She dialyzes on Tuesday Thursday and Saturday. 10/2; patient is seen in conjunction with HBO. The area that is draining on the right anterior medial tibia is more extensive. Copious amounts  of serosanguineous drainage with some purulence. We are still using the wound VAC on the original wound then it is stable. Culture I did of the original IandD showed MRSA I contacted dialysis she is now on vancomycin with dialysis treatments. I asked them to run a month 10/9; patient seen in conjunction with HBO. She had a new spontaneous open area just above the wound on the right medial tibia ankle. More swelling on the right medial tibia. Her wound on the foot looks about the same perhaps slightly better. There is no warmth spreading up her leg but no obvious erythema. her MRI of the foot and ankle and distal tib-fib is not booked for next Friday I discussed this with her in great detail over multiple days. it is likely she has spreading infection upper leg at least involving the distal 25% above the ankle. She knows that if I refer her to orthopedics for infectious disease they are going to recommend amputation and indeed I am not against this myself. We had a good trial at trying to heal the foot which is  what she wanted along with antibiotics debridement and HBO however she clearly has spreading infection [probably staph aureus/MRSA]. Nevertheless she once again tells me she wants to wait the left of the MRI. She still makes comments about having her amputation done in [place]. 10/19; arrives today with significant swelling on the lateral right leg. Last culture I did showed Klebsiella. Multidrug-resistant. Cipro was intermediate sensitivity and that is what I have her on pending her MRI which apparently is going to be done on Thursday this week although this seems to be moving back and forth. She is not systemically unwell. We are using silver alginate on her major wound area on the right medial foot and the draining areas on the right lateral lower leg 10/26; MRI showed extensive abscess in the anterior compartment of the right leg also widespread osteomyelitis involving osseous structures of the midfoot and portions of the hindfoot. Also suspicion for osteomyelitis anterior aspect of the distal medial malleolus. Culture I did of the purulence once again showed a multidrug-resistant Klebsiella. I have been in contact with nephrology late last week and she has been started on cefepime at dialysis to replace the vancomycin We sent a copy of her MRI report to Dr. Geroge Baseman in [place] who is an orthopedic surgeon. The patient takes great stock in his opinion on this. She says she will go to [place] to have her leg amputated if Dr. Geroge Baseman does not feel there is any salvage options. 11/2; she still is not talk to her orthopedic surgeon in [place]. Apparently he will call her at [phone] this afternoon. The quality of this is she has not allowed me to refer her anywhere. She has been told over and over that she needs this amputated but has not agreed to be referred. She tells me her blood sugar was 600 last night but she has not been febrile. 11/9; she never did got a call from the orthopedic  surgeon in [place] therefore that is off the radar. We have arranged to get her see orthopedic surgery at [facility]. She still has a lot of draining purulence coming out of the new abscess in her right leg although that probably came from the osteomyelitis in her right foot and heel. Meanwhile the original wound on the right foot looks very healthy. Continued improvement. The issue is that the last MRI showed osteomyelitis in her right foot extensively she now  has an abscess in the right anterior lower leg. There is nobody in [place] who will offer this woman anything but an amputation and to be honest that is probably what she needs. I think she still wants to talk about limb salvage although at this point I just do not see that. She has completed her vancomycin at dialysis which was for the original staph aureus she is still on cefepime for the more recent Klebsiella. She has had a long course of both of these antibiotics which should have benefited the osteomyelitis on the right foot as well as the abscess. 11/16; apparently [place] elective surgery is shut down because of COVID-19 pandemic. I have reached out to some contacts at [facility] to see if we can get her an orthopedic appointment there. I am concerned about continually leaving this but for the moment everything is static. In fact her original large wound on this foot is closing down. It is the abscess on the right anterior leg that continues to drain purulent serosanguineous material. She is not currently on any antibiotics however she had a prolonged course of vancomycin [1 month] as well as cefepime for a month 02/24/2019 on evaluation today patient appears to be doing better than the last time I saw her. This is not a patient that I typically see. With that being said I am covering for Dr. Dellia Nims this week and again compared to when I last saw her overall the wounds in particular seem to be doing significantly better which  is good news. With that being said the patient tells me several disconcerting things. She has not been able to get in to see anyone for potential debridement in regard to her leg wounds although she tells me that she does not think it is necessary any longer because she is taking care of that herself. She noticed a string coming out of the lower wound on her leg over the last week. The patient states that she subsequently decided that we must of pack something in there and started pulling the string out and as it kept coming and coming she realized this was likely her tendon. With that being said she continued to remove as much of this as she could. She then I subsequently proceeded to using tubes of antibiotic ointment which she will stick down into the wound and then scored as much as she can until she sees it coming out of the other wound opening. She states that in doing this she is actually made things better and there is less redness and irritation. With regard to her foot wound she does have some necrotic tendon and tissue noted in one small corner but again the actual wound itself seems to be doing better with good granulation in general compared to my last evaluation. 12/7; continued improvement in the wound on the substantial part of the right medial foot. Still a necrotic area inferiorly that required debridement but the rest of this looks very healthy and is contracting. She has 2 wounds on the right lateral leg which were her original drainage sites from her abscess but all of this looks a lot better as well. She has been using silver alginate after putting antibiotic biotic ointment in one wound and watching it come out the other. I have talked to her in some detail today. I had given her names of orthopedic surgeons at [facility] for second opinion on what to do about the right leg. I do not think the patient  never called them. She has not been able to get a hold of the orthopedic surgeon in  [place] that she had put a lot of faith in as being somebody would give her an opinion that she would trust. I talked to her today and said even if I could get her in to another orthopedic surgeon about the leg which she accept an amputation and she said she would not therefore I am not going to press this issue for the moment 12/14; continued improvement in his substantial wound on the right medial foot. There is still a necrotic area inferiorly with tightly adherent necrotic debris which I have been working on debriding each time she is here. She does not have an orthopedic appointment. Since last time she was here I looked over her cultures which were essentially MRSA on the foot wound and gram-negative rods in the abscess on the anterior leg. 12/21; continued improvement in the area on the right medial foot. She is not up on this much and that is probably a good thing since I do not know it could support continuous ambulation. She has a small area on the right lateral leg which were remanence of the IandD's I did because of the abscess. I think she should probably have prophylactic antibiotics I am going to have to look this over to see if we can make an intelligent decision here. In the meantime her major wound is come down nicely. Necrotic area inferiorly is still there but looks a lot better 04/06/2019; she has had some improvement in the overall surface area on the right medial foot somewhat narrowedr both but somewhat longer. The areas on the right lateral leg which were initial IandD sites are superficial. Nothing is present on the right heel. We are using silver alginate to the wound areas 1/18; right medial foot somewhat smaller. Still a deep probing area in the most distal recess of the wound. She has nothing open on the right leg. She has a new wound on the plantar aspect of her left fourth toe which may have come from just pulling skin. The patient using Medihoney on the wound on her  foot under silver alginate. I cannot discourage her from this 2/1; 2-week follow-up using silver alginate on the right foot and her left fourth toe. The area on the right dorsal foot is contracted although there is still the deep area in the most distal part of the wound but still has some probing depth. No overt infection 2/15; 2-week follow-up. She continues to have improvement in the surface area on the dorsal right foot. Even the tunneling area from last time is almost closed. The area that was on the plantar part of her left fourth toe over the PIP is indeed closed 3/1; 2-week follow-up. Continued improvement in surface area. The original divot that we have been debriding inferiorly I think has full epithelialization although the epithelialization is gone down into the wound with probably 4 mm of depth. Even under intense illumination I am unable to see anything open here. The remanence of the wound in this area actually look quite healthy. We have been using silver alginate 3/15; 2-week follow-up. Unfortunately not as good today. She has a comma shaped wound on the dorsal foot however the upper part of this is larger. Under illumination debris on the surface She also tells [place] that she was on her right leg 2 times in the last couple of weeks mostly to reach up for things  above her head etc. She felt a sharp pain in the right leg which she thinks is somewhere from the ankle to the knee. The patient has neuropathy and is really uncertain. She cannot feel her foot so she does not think it was coming from there 3/29; 2-week follow-up. Her wound measures smaller. Surface of the wound appears reasonable. She is using silver alginate with underlying Medihoney. She has home health. X-rays I did of her tib-fib last time were negative although it did show arterial calcification 4/12; 2-week follow-up. Her wound measures smaller in length. Using manuka honey with silver alginate on top. She has home  health. 4/26; 2-week follow-up. Her wound is smaller but still very adherent debris under illumination requiring debridement she has been using manuka honey with silver alginate. 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 Been using Hydrofera Blue. She is on suppressive Bactrim but apparently she has been taking it twice daily 6/7; I have not seen this wound and about 6 weeks. Since then she was up in [place]. By her own admission she was walking on the foot because she did not have a wheelchair. The wound is not nearly as healthy looking as it was the last time I saw this. We ordered different things for her but she only uses Medihoney and silver alginate. As far as I know she is on suppressive trimethoprim sulfamethoxazole. She does not admit to any fever or chills. Her CBGs apparently are at baseline however she is saying that she feels some discomfort on the lateral part of her ankle I looked over her last inflammatory markers from the summer 2020 at which time she had a deeply necrotic infected wound in this area. On 11/10/2018 her sedimentation rate was 56 and C-reactive protein 9.9. This was 107 and 29 on 07/29/2018. 6/17; the patient had a necrotic wound the last time she was here on the right dorsal foot. After debridement I did a culture. This showed a very resistant ESBL Klebsiella as well as Enterococcus. Her x-ray of the foot which was done because of warmth and some discomfort showed bone destruction within the carpal bones involving the navicular acute cuboid lateral middle cuneiforms but essentially unchanged from her prior study which was done on 10/29/2018. The findings were felt to represent chronic osteomyelitis. We did inflammatory markers on her. Her white count was 5.25 sedimentation rate 16 and C-reactive protein at 11.1. Notable for the fact that in August 2020 her CRP was  9.9 and sedimentation rate 56. I have looked at her x-rays. It is true that the bone destruction is very impressive however the patient came into this clinic for the wound on her right foot with pieces of bone literally falling out anteriorly with purulent material. I am not exactly sure I could have expected anything different. She has not been systemically unwell no fever chills or blood sugars have been reasonable. 6/28; she arrives with a right heel closed. The substantial area on the right anterior foot looks healthy. Much better looking surface. I think we can change to [facility] seems to help this previously. She is getting her antibiotics at dialysis she should be just about finished 7/9; changed to [facility] last week. Surface wound looks satisfactory not much change in surface area however. She is going to [place] state next week this is usually a difficult thing for this  patient follow-up will be for 2 weeks. 7/23; using Hydrofera Blue. She returns from her trip and the wound looks surprisingly good. Usually when this patient goes on trips she comes back with a lot of problems with the wound. She is saying that she sometimes feels an episodic "crunching" feeling on the lateral part of the foot. She is neuropathic and not feeling pain but wonders whether this could be a neuropathic dysesthesia. 11/13/19-Patient returns after 3 weeks, the wound itself is stable and patient states that there is nothing new going on she is on some extra anxiety medications and is resisting the temptation to pick at the dry skin around the wound. 9/20; patient has not been here in over a month and I have not seen her in 2 months. The wound in terms of size I think is about the same. There is no exposed bone. She has a nonviable surface on this. She is supposed to be using [facility] however she is also been using some form of honey preparation as well as a silver-based dressings. I do not think  she has any pattern to this. 10/4; 2-week follow-up. Patient has been using some form of spray which she says has honey and silver to purchase this online she has been covering it with gauze. In spite of this the wound actually looks quite good. The deeper divot distally appears to be close down. There is a rim of epithelialization. 10/18; 2-week follow-up. Patient has been using her Hydrofera Blue covered with her silver honey spray that she got online. 11/1; 2-week follow-up. She is using Hydrofera Blue with a silver honey spray. Wound bed is measuring smaller. She has noticed that her foot is warmer on the right. She is concerned about infection. For a long period of time I had her on prophylactic trimethoprim sulfamethoxazole DS 1 tablet daily. She is asking for this to be restarted. The patient is walking on this foot because of repairs that are being done in a home her but her room is on the second floor she has to go up and down stairs. I have cautioned against this however as usual she will do exactly what she wants to do 11/15; 2-week follow-up. She uses Hydrofera Blue with a silver/honey spray which I have never heard of. I think her wound looks about the same. Some epithelialization. No evidence that this is infected. I think she is walking on this more than we agreed on. She is going on extensive vacation over Thanksgiving 12/3; 2-week follow-up. She is using Hydrofera Blue however over Thanksgiving she ran out of this and she is simply been using Medihoney. In spite of this her wound is smaller almost divided into 2 now. She traveled extensively over Thanksgiving and actually looks quite good in spite of this. Usually this is been a marker of problems for her 12/17; 2-week follow-up. She is using Hydrofera Blue. The wound is smaller. Debris on the surface of this is fibrinous. She is traveling to [place] over the holidays which never bodes well for her wounds. I think she is walking more  on her feet then she is even willing to admit and she tells me she does walk 2/11; using Hydrofera Blue. Her wounds are contracting however she walks in the clinic with a history that she has not been able to eat she has had vomiting. She also has right eye problems. Her blood sugar was 567. She had blood work done at dialysis and we called  there to get her blood work although it still had not returned although they should have this by the end of the day we were told. She also stated that she was not sure what her blood sugar was as her glucose monitor was not working and not coming to tomorrow. She also for some reason does not think her insulin pump is working well. Her endocrinologist is Dr. Elayne Snare 06/03/2020 upon evaluation today patient actually appears to be doing excellent in regard to her wound. In fact she tells me it was not even bleeding until she picked a dry piece of skin off while we were getting ready to come in and see her today. Fortunately there is no signs of active infection which is great news and overall very pleased. She is going to be seeing her neurologist sometime shortly I believe it might even be on Monday. Nonetheless there can proceed with the work-up as far as anything else going on with her eye the fact that her right eyelid is drooping. 3/25; right medial dorsal foot. She has superficial areas here that appear to be fully epithelialized she is using Medihoney and silver alginate and some combination. ooShe has a new area on the mid left foot at roughly the level of the fifth metatarsal base 4/84/8; right medial dorsal foot. Still superficial areas here. I cleaned up 1 of these today she has been using I think mostly Medihoney but I would like her to use silver alginate. The area on the mid part of her foot also looks improved on the left. Since she was last here she tells me that she noted pus in the area of her left foot. She told dialysis about this they did a  culture and ultimately she is on IV antibiotics but were not sure which one. They referred her to [name] he said that he did not need to follow her. I have not really verified any of this and I do not know what antibiotic she is on at dialysis 4/29; patient has been using Medihoney to her wounds. She reports taking off the toenail to her left second toe. She states that home health discharged her and she has not had help with dressing changes. She denies any signs and symptoms of infection. 5/13; 2-week follow-up. Miraculously the wound on the right foot dorsally is healed. She has an area on the tip of her left third toe and an area in the left midfoot. 5/26; patient presents for 2-week follow-up. She has 2 open wounds 1 to her plantar foot and the other to the left third toe. She has noticed increased swelling and redness to the toe wound. It is unclear exactly what she is doing for dressing changes as she has multiple dressing options at home. She states that it is easiest to do [facility] on the plantar wound and Prisma or Medihoney on the toe wound. 6/3; saw Dr. Maryjane Hurter week. She put her on Keflex because of the swelling I think of the left second toe. She arrives today with new breakdown x3 on the dorsal right foot which is disappointing. She also complains of pain in the right ankle, 3 liquid stools per day, chills without fever 6/17; patient was in [place]. I think a little more ambulatory than I would like to hear. In any case she still has the 3 small open areas on the dorsal part of her foot on the right medial leg these look about the same as last  time. She is supposed to be using Prisma but I think used Medihoney. She has an area on the left lateral plantar foot which had undermining under her skin and a blood blister with the 2 areas communicating today. And finally an area on the tip of her left second toe 6/30; patient has 3 small areas that look better than the last time on the  right dorsal foot. She uses a combination of her products including collagen, Hydrofera Blue and the Medihoney nevertheless they look better. The area on the left midfoot might have been healed although she pulled some skin off superficial open area. The left second toe still does not look that healthy. Open areas on the tips of the 7/21; the areas on her right dorsal foot are still present although they look benign. We are using silver collagen although she may be using any of the products she had at home. The real problem here is the left second toe. Necrotic wound over the distal phalanx and distal interphalangeal joint. This probes directly to bone. She arrives in clinic today with an interesting story. She went to [place] last week. She became ill there with refractory nausea and vomiting. She went to an urgent care and then the ER. I am able to see parts of this in Care Everywhere. She had 2 blood cultures done both of which ultimately showed MRSA. This was sensitive to daptomycin and linezolid and vancomycin. Her white count was 4.8 hemoglobin 10.4. X-ray of the left foot showed prior resection of the left fifth metatarsal no evidence of osteomyelitis. Her C-reactive protein on 10/13/2020 was 104.8. She refused admission to the hospital as she was to board her flight to return to [place]. She dialyzed this morning I am not certain why she did not bring this to their attention 7/27; fortunately the left second toe and the erythema in her left foot looks a lot better on the IV vancomycin at dialysis. I was able to verify earlier this week that she had indeed received it on the weekend starting last Saturday. She will need at least 4 weeks. I saw her left arm shunt and it looks fine. We are using silver alginate on the toe and we have been using I think Medihoney on the right foot which is actually been her decision. She has 2 open wounds dorsally. She does not describe fever chills her  blood sugars are erratic but seem to be at her baseline 8/18; she has completed her antibiotics at dialysis but is unsure how long this was ordered. I have asked her to check when she goes to dialysis on Saturday. She is using polymen on the right foot dorsally and silver collagen not silver alginate to the left second toe. Her blood pressure is high today but blood sugars have been stable according to her she is having no fever or systemic symptoms 9/14; about a month since we have seen her. In the interim she was admitted to hospital from 11/21/2020 through 12/01/2020 with delirium secondary to sepsis. She was felt to have sepsis from a left second toe MRSA infection. She is on IV vancomycin and I think will complete 6 weeks of this on 12/29/2020. Her blood cultures were negative. A transthoracic echo did not show any vegetations. I do not believe she had a TEE. She comes in today with a sizable wound on the right medial foot with some necrotic surface in the mid part of this and also a wound on  the tip of her left second toe. She has been using Medihoney 10/18; I have not seen this woman in about a month. In the meantime her left second toe seems to have healed over although likely that there has been bone or joint destruction in the distal part of the second toe. The area on the right lateral foot is worse. Our intake nurses noted green drainage. Not completely clear as usual what she is using here. She tells me that she did see ground-up some antibiotics likely cephalexin although I am not sure about this and she took 2 days worth. She had called some weeks ago about the amount of antibiotics she got for her MRSA sepsis. We referred her back to dialysis but she claims that she really only got a week of therapy. I find that hard to believe 11/15; 1 month follow-up. She comes in with a small wound on the right dorsal foot better than last time. As usual she is using some combination of Medihoney and  silver alginate. She was traveling in [facility] but fortunately this time when she traveled to the wounds do not seem to have suffered. There is nothing open on the left foot 12/7; patient comes in today with a right dorsal foot has 2 small open areas. She is however running a fever with some warmth and discomfort on the medial aspect of the right leg to about the mid calf level. According the patient they are having trouble with her shunt in the left arm and apparently is going for some form of revision/reopening I am not sure. She thinks she has been running low-grade fevers at home perhaps in the 99 range. Her blood sugar was slightly over 200 today blood pressure quite high. She is not in a lot of pain but says that the area feels somewhat uncomfortable [has neuropathy] Objective Constitutional Patient is hypertensive.. Pulse regular and within target range for patient.Marland Kitchen Respirations regular, non-labored and within target range.. Temperature is normal and within the target range for the patient.Marland Kitchen Appears in no distress. Vitals Time Taken: 12:35 PM, Height: 67 in, Weight: 125 lbs, BMI: 19.6, Temperature: 100.1 F, Pulse: 93 bpm, Respiratory Rate: 17 breaths/min, Blood Pressure: 196/100 mmHg. Respiratory work of breathing is normal. Bilateral breath sounds are clear and equal in all lobes with no wheezes, rales or rhonchi.. Cardiovascular Midsystolic murmur no other findings. Dorsalis pedis pulses palpable on the right. Gastrointestinal (GI) No liver or spleen enlargement. General Notes: Wound exam; 2 small areas on the right dorsal foot medially these by themselves do not look ominous however she has warmth extending to the right medial calf. She is severely neuropathic therefore its difficult to reproduce pain however she seems to think it is uncomfortable. Her shunt in the left mid humeral area does not look ominous. Integumentary (Hair, Skin) Wound #52 status is Open. Original cause of  wound was Gradually Appeared. The date acquired was: 09/02/2020. The wound has been in treatment 26 weeks. The wound is located on the Right Foot. The wound measures 0.5cm length x 1.2cm width x 0.1cm depth; 0.471cm^2 area and 0.047cm^3 volume. There is Fat Layer (Subcutaneous Tissue) exposed. There is no tunneling or undermining noted. There is a medium amount of serosanguineous drainage noted. The wound margin is distinct with the outline attached to the wound base. There is large (67-100%) red, pink granulation within the wound bed. There is a small (1-33%) amount of necrotic tissue within the wound bed including Adherent Slough. Wound #  53 status is Open. Original cause of wound was Gradually Appeared. The date acquired was: 03/08/2021. The wound is located on the Right,Distal Foot. The wound measures 0.5cm length x 1cm width x 0.1cm depth; 0.393cm^2 area and 0.039cm^3 volume. There is no tunneling or undermining noted. There is a medium amount of serosanguineous drainage noted. The wound margin is distinct with the outline attached to the wound base. There is large (67-100%) red, pink granulation within the wound bed. There is no necrotic tissue within the wound bed. 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 - Dr. Dellia Nims Bathing/ Shower/ Hygiene: May shower and wash wound with soap and water. Edema Control - Lymphedema / SCD / Other: Elevate legs to the level of the heart or above for 30 minutes daily and/or when sitting, a frequency of: - throughout the day Avoid standing for long periods of time. Moisturize legs daily. - with dressing changes Off-Loading: Open toe surgical shoe to: - to both feet Other: -  minimal weight bearing right foot WOUND #52: - Foot Wound Laterality: Right Cleanser: Soap and Water Every Other Day/30 Days Discharge Instructions: May shower and wash wound with dial antibacterial soap and water prior to dressing change. Cleanser: Wound Cleanser (Generic) Every Other Day/30 Days Discharge Instructions: Cleanse the wound with wound cleanser prior to applying a clean dressing using gauze sponges, not tissue or cotton balls. Prim Dressing: MediHoney Gel, tube 1.5 (oz) Every Other Day/30 Days ary Discharge Instructions: Apply to wound bed as instructed Secondary Dressing: Woven Gauze Sponge, Non-Sterile 4x4 in (Generic) Every Other Day/30 Days Discharge Instructions: Apply over primary dressing as directed. Secondary Dressing: ABD Pad, 5x9 (Generic) Every Other Day/30 Days Discharge Instructions: Apply over primary dressing as directed. Secured With: Elastic Bandage 4 inch (ACE bandage) (Generic) Every Other Day/30 Days Discharge Instructions: Secure with ACE bandage as directed. Secured With: [employer], 4.5x3.1 (in/yd) (Generic) Every Other Day/30 Days Discharge Instructions: Secure with Kerlix as directed. Secured With: Paper T ape, 1x10 (in/yd) (Generic) Every Other Day/30 Days Discharge Instructions: Secure dressing with tape as directed. WOUND #53: - Foot Wound Laterality: Right, Distal Cleanser: Soap and Water Every Other Day/30 Days Discharge Instructions: May shower and wash wound with dial antibacterial soap and water prior to dressing change. Cleanser: Wound Cleanser (Generic) Every Other Day/30 Days Discharge Instructions: Cleanse the wound with wound cleanser prior to applying a clean dressing using gauze sponges, not tissue or cotton balls. Prim Dressing: MediHoney Gel, tube 1.5 (oz) Every Other Day/30 Days ary Discharge Instructions: Apply to wound bed as instructed Secondary Dressing: Woven Gauze Sponge, Non-Sterile 4x4 in (Generic) Every Other  Day/30 Days Discharge Instructions: Apply over primary dressing as directed. Secondary Dressing: ABD Pad, 5x9 (Generic) Every Other Day/30 Days Discharge Instructions: Apply over primary dressing as directed. Secured With: Elastic Bandage 4 inch (ACE bandage) (Generic) Every Other Day/30 Days Discharge Instructions: Secure with ACE bandage as directed. Secured With: [employer], 4.5x3.1 (in/yd) (Generic) Every Other Day/30 Days Discharge Instructions: Secure with Kerlix as directed. Secured With: Paper T ape, 1x10 (in/yd) (Generic) Every Other Day/30 Days Discharge Instructions: Secure dressing with tape as directed. 1. We continued with the Medihoney she is using on the right foot. She has no open wound on  the left foot 2. I am concerned about her low-grade fever and the warmth in her medial leg. She is at high risk for MRSA infections notable for the fact that earlier this year she had MRSA sepsis the source of which I do not think was ever conclusive. 3. I went ahead and give her a course of Nuzyra the last 10 days I gave her samples for today and tomorrow and then 10 days after that 300 mg. This does not have dialysis dosing implications 4. I have asked her to seek emergent care if her fever goes up, blood sugars become uncontrolled or she has other constitutional symptoms. She expressed understanding. 5. In another situation I might of referred her back to dialysis doctors or her primary however I know she has chronic osteomyelitis in the right foot and recent sepsis from MRSA source of which was not determined I think I be putting her at risk without treating her empirically Electronic Signature(s) Signed: 03/08/2021 3:48:37 PM By: Linton Ham MD Entered By: Linton Ham on 03/08/2021 13:31:35 -------------------------------------------------------------------------------- SuperBill Details Patient Name: Date of Service: [name] 03/08/2021 Medical Record  Number: [id number] Patient Account Number: [id number] Date of Birth/Sex: Treating RN: Dec 06, 1970 (50 y.o. 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

## 2021-03-08 NOTE — Progress Notes (Signed)
MAURIA, ASQUITH ([id number]) Visit Report for 03/08/2021 Arrival Information Details Patient Name: Date of Service: [name] 03/08/2021 12:30 PM Medical Record Number: [id number] Patient Account Number: [id number] Date of Birth/Sex: Treating RN: 1970-06-10 (50 y.o. [name], [name] Primary Care Kyheem Bathgate: [name] Other Clinician: Referring Bryce Cheever: Treating Cloma Rahrig/Extender: Vernelle Emerald in Treatment: 49 Visit Information History Since Last Visit Added or deleted any medications: No Patient Arrived: Wheel Chair Any new allergies or adverse reactions: No Arrival Time: 12:36 Had a fall or experienced change in No Accompanied By: self activities of daily living that may affect Transfer Assistance: Manual risk of falls: Patient Identification Verified: Yes Signs or symptoms of abuse/neglect since last visito No Secondary Verification Process Completed: Yes Hospitalized since last visit: No Patient Requires Transmission-Based Precautions: No Implantable device outside of the clinic excluding No Patient Has Alerts: No cellular tissue based products placed in the center since last visit: Has Dressing in Place as Prescribed: Yes Pain Present Now: No Electronic Signature(s) Signed: 03/08/2021 4:34:42 PM By: Rhae Hammock RN Entered By: Rhae Hammock on 03/08/2021 12:36:34 -------------------------------------------------------------------------------- Clinic Level of Care Assessment Details Patient Name: Date of Service: [name] 03/08/2021 12:30 PM Medical Record Number: [id number] Patient Account Number: [id number] Date of Birth/Sex: Treating RN: July 21, 1970 (50 y.o. [name], [name] Primary Care Alantra Popoca: [name] Other Clinician: Referring Shavonte Zhao: Treating Mariann Palo/Extender: Vernelle Emerald in Treatment: 131 Clinic Level of Care Assessment Items TOOL 4 Quantity  Score X- 1 0 Use when only an EandM is performed on FOLLOW-UP visit ASSESSMENTS - Nursing Assessment / Reassessment X- 1 10 Reassessment of Co-morbidities (includes updates in patient status) X- 1 5 Reassessment of Adherence to Treatment Plan ASSESSMENTS - Wound and Skin A ssessment / Reassessment X - Simple Wound Assessment / Reassessment - one wound 1 5 []  - 0 Complex Wound Assessment / Reassessment - multiple wounds []  - 0 Dermatologic / Skin Assessment (not related to wound area) ASSESSMENTS - Focused Assessment X- 1 5 Circumferential Edema Measurements - multi extremities []  - 0 Nutritional Assessment / Counseling / Intervention []  - 0 Lower Extremity Assessment (monofilament, tuning fork, pulses) []  - 0 Peripheral Arterial Disease Assessment (using hand held doppler) ASSESSMENTS - Ostomy and/or Continence Assessment and Care []  - 0 Incontinence Assessment and Management []  - 0 Ostomy Care Assessment and Management (repouching, etc.) PROCESS - Coordination of Care X - Simple Patient / Family Education for ongoing care 1 15 []  - 0 Complex (extensive) Patient / Family Education for ongoing care X- 1 10 Staff obtains Programmer, systems, Records, T Results / Process Orders est []  - 0 Staff telephones HHA, Nursing Homes / Clarify orders / etc []  - 0 Routine Transfer to another Facility (non-emergent condition) []  - 0 Routine Hospital Admission (non-emergent condition) []  - 0 New Admissions / Biomedical engineer / Ordering NPWT Apligraf, etc. , []  - 0 Emergency Hospital Admission (emergent condition) X- 1 10 Simple Discharge Coordination []  - 0 Complex (extensive) Discharge Coordination PROCESS - Special Needs []  - 0 Pediatric / Minor Patient Management []  - 0 Isolation Patient Management []  - 0 Hearing / Language / Visual special needs []  - 0 Assessment of Community assistance (transportation, D/C planning, etc.) []  - 0 Additional assistance / Altered  mentation []  - 0 Support Surface(s) Assessment (bed, cushion, seat, etc.) INTERVENTIONS - Wound Cleansing / Measurement X - Simple Wound Cleansing - one wound 1 5 []  - 0  Complex Wound Cleansing - multiple wounds X- 1 5 Wound Imaging (photographs - any number of wounds) $RemoveBe'[]'gjpwhWLRi$  - 0 Wound Tracing (instead of photographs) X- 1 5 Simple Wound Measurement - one wound $RemoveB'[]'nqDtcuUd$  - 0 Complex Wound Measurement - multiple wounds INTERVENTIONS - Wound Dressings X - Small Wound Dressing one or multiple wounds 1 10 $Re'[]'gjY$  - 0 Medium Wound Dressing one or multiple wounds $RemoveBeforeD'[]'gfumPTXeQnowGM$  - 0 Large Wound Dressing one or multiple wounds $RemoveBeforeD'[]'yhimBYYRLCnzED$  - 0 Application of Medications - topical $RemoveB'[]'nEcjrBBC$  - 0 Application of Medications - injection INTERVENTIONS - Miscellaneous $RemoveBeforeD'[]'xHkBeTppUlmxhj$  - 0 External ear exam $Remove'[]'dEMLUUk$  - 0 Specimen Collection (cultures, biopsies, blood, body fluids, etc.) $RemoveBefor'[]'pJaHReamgIsy$  - 0 Specimen(s) / Culture(s) sent or taken to Lab for analysis $RemoveBefo'[]'kAfvwJXnpjm$  - 0 Patient Transfer (multiple staff / Civil Service fast streamer / Similar devices) $RemoveBeforeDE'[]'OJdmqpaIRLpxBjV$  - 0 Simple Staple / Suture removal (25 or less) $Remove'[]'ECsYjgO$  - 0 Complex Staple / Suture removal (26 or more) $Remove'[]'gVKZjhz$  - 0 Hypo / Hyperglycemic Management (close monitor of Blood Glucose) $RemoveBefore'[]'gUKWIRLtuJxhg$  - 0 Ankle / Brachial Index (ABI) - do not check if billed separately X- 1 5 Vital Signs Has the patient been seen at the hospital within the last three years: Yes Total Score: 90 Level Of Care: New/Established - Level 3 Electronic Signature(s) Signed: 03/08/2021 4:34:42 PM By: Rhae Hammock RN Entered By: Rhae Hammock on 03/08/2021 13:30:35 -------------------------------------------------------------------------------- Lower Extremity Assessment Details Patient Name: Date of Service: [name] 03/08/2021 12:30 PM Medical Record Number: [id number] Patient Account Number: [id number] Date of Birth/Sex: Treating RN: [birth year] (50 y.o. [name], [name] Primary Care [place]: [name] Other Clinician: Referring  Lizzeth Meder: Treating Jakel Alphin/Extender: Vernelle Emerald in Treatment: 131 Edema Assessment Assessed: Shirlyn Goltz: No] Patrice Paradise: Yes] Edema: [Left: Ye] [Right: s] Calf Left: Right: Point of Measurement: 30 cm From Medial Instep 28 cm Ankle Left: Right: Point of Measurement: 9 cm From Medial Instep 19.5 cm Vascular Assessment Pulses: Dorsalis Pedis Palpable: [Right:Yes] Posterior Tibial Palpable: [Right:Yes] Electronic Signature(s) Signed: 03/08/2021 4:34:42 PM By: Rhae Hammock RN Entered By: Rhae Hammock on 03/08/2021 12:37:08 -------------------------------------------------------------------------------- Multi Wound Chart Details Patient Name: Date of Service: [name] 03/08/2021 12:30 PM Medical Record Number: [id number] Patient Account Number: [id number] Date of Birth/Sex: Treating RN: 12/08/1970 (50 y.o. F) Primary Care Yesha Muchow: [name] Other Clinician: Referring Heloise Gordan: Treating Deontray Hunnicutt/Extender: Vernelle Emerald in Treatment: 131 Vital Signs Height(in): 48 Pulse(bpm): 61 Weight(lbs): 125 Blood Pressure(mmHg): 196/100 Body Mass Index(BMI): 20 Temperature(F): 100.1 Respiratory Rate(breaths/min): 17 Photos: [N/A:N/A] Right Foot Right, Distal Foot N/A Wound Location: Gradually Appeared Gradually Appeared N/A Wounding Event: Diabetic Wound/Ulcer of the Lower Diabetic Wound/Ulcer of the Lower N/A Primary Etiology: Extremity Extremity Cataracts, Chronic sinus Cataracts, Chronic sinus N/A Comorbid History: problems/congestion, Anemia, Sleep problems/congestion, Anemia, Sleep Apnea, Deep Vein Thrombosis, Apnea, Deep Vein Thrombosis, Hypertension, Peripheral Arterial Hypertension, Peripheral Arterial Disease, Type I Diabetes, Disease, Type I Diabetes, Osteoarthritis, Osteomyelitis, Osteoarthritis, Osteomyelitis, Neuropathy, Seizure Disorder Neuropathy, Seizure Disorder 09/02/2020 03/08/2021 N/A Date  Acquired: 26 0 N/A Weeks of Treatment: Open Open N/A Wound Status: Yes No N/A Clustered Wound: 2 N/A N/A Clustered Quantity: 0.5x1.2x0.1 0.5x1x0.1 N/A Measurements L x W x D (cm) 0.471 0.393 N/A A (cm) : rea 0.047 0.039 N/A Volume (cm) : 84.90% 0.00% N/A % Reduction in A rea: 92.40% 0.00% N/A % Reduction in Volume: Grade 2 Grade 2 N/A Classification: Medium Medium N/A Exudate A mount: Serosanguineous Serosanguineous N/A Exudate Type: red, brown red,  brown N/A Exudate Color: Distinct, outline attached Distinct, outline attached N/A Wound Margin: Large (67-100%) Large (67-100%) N/A Granulation A mount: Red, Pink Red, Pink N/A Granulation Quality: Small (1-33%) None Present (0%) N/A Necrotic A mount: Fat Layer (Subcutaneous Tissue): Yes Fascia: No N/A Exposed Structures: Fascia: No Fat Layer (Subcutaneous Tissue): No Tendon: No Tendon: No Muscle: No Muscle: No Joint: No Joint: No Bone: No Bone: No Small (1-33%) None N/A Epithelialization: Treatment Notes Electronic Signature(s) Signed: 03/08/2021 3:48:37 PM By: Linton Ham MD Entered By: Linton Ham on 03/08/2021 13:25:24 -------------------------------------------------------------------------------- Multi-Disciplinary Care Plan Details Patient Name: Date of Service: [place] LLO [address] [name] 03/08/2021 12:30 PM Medical Record Number: [id number] Patient Account Number: [id number] Date of Birth/Sex: Treating RN: Oct 27, 1970 (50 y.o. [name], [name] Primary Care Nicosha Struve: [name] Other Clinician: Referring Nishita Isaacks: Treating Cassadie Pankonin/Extender: Vernelle Emerald in Treatment: [place] reviewed with physician Active Inactive Nutrition Nursing Diagnoses: Imbalanced nutrition Impaired glucose control: actual or potential Potential for alteratiion in Nutrition/Potential for imbalanced nutrition Goals: Patient/caregiver agrees to and  verbalizes understanding of need to use nutritional supplements and/or vitamins as prescribed Date Initiated: 09/03/2018 Date Inactivated: 10/07/2018 Target Resolution Date: 10/01/2018 Goal Status: Met Patient/caregiver will maintain therapeutic glucose control Date Initiated: 09/03/2018 Target Resolution Date: 03/14/2021 Goal Status: Active Interventions: Provide education on elevated blood sugars and impact on wound healing Treatment Activities: Patient referred to Primary Care Physician for further nutritional evaluation : 09/03/2018 Notes: 07/29/20: Glucose control ongoing, target date extended. Wound/Skin Impairment Nursing Diagnoses: Impaired tissue integrity Knowledge deficit related to ulceration/compromised skin integrity Goals: Patient/caregiver will verbalize understanding of skin care regimen Date Initiated: 09/03/2018 Target Resolution Date: 03/14/2021 Goal Status: Active Ulcer/skin breakdown will have a volume reduction of 30% by week 4 Date Initiated: 09/03/2018 Date Inactivated: 10/07/2018 Target Resolution Date: 10/01/2018 Goal Status: Unmet Unmet Reason: COMORBITIES Ulcer/skin breakdown will have a volume reduction of 50% by week 8 Date Initiated: 10/07/2018 Date Inactivated: 11/03/2018 Target Resolution Date: 10/31/2018 Goal Status: Unmet Unmet Reason: Osteomyelitis Interventions: Assess patient/caregiver ability to obtain necessary supplies Assess patient/caregiver ability to perform ulcer/skin care regimen upon admission and as needed Assess ulceration(s) every visit Provide education on ulcer and skin care Treatment Activities: Skin care regimen initiated : 09/03/2018 Topical wound management initiated : 09/03/2018 Notes: Electronic Signature(s) Signed: 03/08/2021 4:34:42 PM By: Rhae Hammock RN Entered By: Rhae Hammock on 03/08/2021 13:29:22 -------------------------------------------------------------------------------- Pain Assessment Details Patient Name: Date  of Service: [name] 03/08/2021 12:30 PM Medical Record Number: [id number] Patient Account Number: [id number] Date of Birth/Sex: Treating RN: [birth year] (50 y.o. [name], [name] Primary Care Darroll Bredeson: [name] Other Clinician: Referring Theran Vandergrift: Treating Derinda Bartus/Extender: Vernelle Emerald in Treatment: 131 Active Problems Location of Pain Severity and Description of Pain Patient Has Paino No Site Locations Pain Management and Medication Current Pain Management: Electronic Signature(s) Signed: 03/08/2021 4:34:42 PM By: Rhae Hammock RN Entered By: Rhae Hammock on 03/08/2021 12:36:58 -------------------------------------------------------------------------------- Patient/Caregiver Education Details Patient Name: Date of Service: [name] 12/7/2022andnbsp12:30 PM Medical Record Number: [id number] Patient Account Number: [id number] Date of Birth/Gender: Treating RN: 08-27-1970 (50 y.o. Benjaman Lobe Primary Care Physician: [name] Other Clinician: Referring Physician: Treating Physician/Extender: Vernelle Emerald in Treatment: 131 Education Assessment Education Provided To: Patient Education Topics Provided Wound/Skin Impairment: Methods: Explain/Verbal Responses: State content correctly [employer]) Signed: 03/08/2021 4:34:42 PM By: Rhae Hammock RN Entered By: Rhae Hammock on 03/08/2021  13:29:47 -------------------------------------------------------------------------------- Wound Assessment Details Patient Name: Date of Service: [name] 03/08/2021 12:30 PM Medical Record Number: [id number] Patient Account Number: [id number] Date of Birth/Sex: Treating RN: 27-Dec-1970 (50 y.o. [name], [name] Primary Care Denny Mccree: [name] Other Clinician: Referring Leocadio Heal: Treating Donnica Jarnagin/Extender: Vernelle Emerald in Treatment: 131 Wound Status Wound Number: 52 Primary Diabetic Wound/Ulcer of the Lower Extremity Etiology: Wound Location: Right Foot Wound Open Wounding Event: Gradually Appeared Status: Date Acquired: 09/02/2020 Comorbid Cataracts, Chronic sinus problems/congestion, Anemia, Sleep Weeks Of Treatment: 26 History: Apnea, Deep Vein Thrombosis, Hypertension, Peripheral Arterial Clustered Wound: Yes Disease, Type I Diabetes, Osteoarthritis, Osteomyelitis, Neuropathy, Seizure Disorder Photos Wound Measurements Length: (cm) 0.5 Width: (cm) 1.2 Depth: (cm) 0.1 Clustered Quantity: 2 Area: (cm) 0.471 Volume: (cm) 0.047 % Reduction in Area: 84.9% % Reduction in Volume: 92.4% Epithelialization: Small (1-33%) Tunneling: No Undermining: No Wound Description Classification: Grade 2 Wound Margin: Distinct, outline attached Exudate Amount: Medium Exudate Type: Serosanguineous Exudate Color: red, brown Foul Odor After Cleansing: No Slough/Fibrino Yes Wound Bed Granulation Amount: Large (67-100%) Exposed Structure Granulation Quality: Red, Pink Fascia Exposed: No Necrotic Amount: Small (1-33%) Fat Layer (Subcutaneous Tissue) Exposed: Yes Necrotic Quality: Adherent Slough Tendon Exposed: No Muscle Exposed: No Joint Exposed: No Bone Exposed: No Electronic Signature(s) Signed: 03/08/2021 4:34:42 PM By: Rhae Hammock RN Entered By: Rhae Hammock on 03/08/2021 12:53:18 -------------------------------------------------------------------------------- Wound Assessment Details Patient Name: Date of Service: [name] 03/08/2021 12:30 PM Medical Record Number: [id number] Patient Account Number: [id number] Date of Birth/Sex: Treating RN: September 13, 1970 (50 y.o. [name], [name] Primary Care [place]: [name] Other Clinician: Referring Mayfield Schoene: Treating Kalijah Zeiss/Extender: Vernelle Emerald in Treatment: 131 Wound  Status Wound Number: 53 Primary Diabetic Wound/Ulcer of the Lower Extremity Etiology: Wound Location: Right, Distal Foot Wound Open Wounding Event: Gradually Appeared Status: Date Acquired: 03/08/2021 Comorbid Cataracts, Chronic sinus problems/congestion, Anemia, Sleep Weeks Of Treatment: 0 History: Apnea, Deep Vein Thrombosis, Hypertension, Peripheral Arterial Clustered Wound: No Disease, Type I Diabetes, Osteoarthritis, Osteomyelitis, Neuropathy, Seizure Disorder Photos Wound Measurements Length: (cm) 0.5 Width: (cm) 1 Depth: (cm) 0.1 Area: (cm) 0.393 Volume: (cm) 0.039 % Reduction in Area: 0% % Reduction in Volume: 0% Epithelialization: None Tunneling: No Undermining: No Wound Description Classification: Grade 2 Wound Margin: Distinct, outline attached Exudate Amount: Medium Exudate Type: Serosanguineous Exudate Color: red, brown Foul Odor After Cleansing: No Slough/Fibrino No Wound Bed Granulation Amount: Large (67-100%) Exposed Structure Granulation Quality: Red, Pink Fascia Exposed: No Necrotic Amount: None Present (0%) Fat Layer (Subcutaneous Tissue) Exposed: No Tendon Exposed: No Muscle Exposed: No Joint Exposed: No Bone Exposed: No Electronic Signature(s) Signed: 03/08/2021 4:34:42 PM By: Rhae Hammock RN Entered By: Rhae Hammock on 03/08/2021 12:53:51 -------------------------------------------------------------------------------- Vitals Details Patient Name: Date of Service: [name] 03/08/2021 12:30 PM Medical Record Number: [id number] Patient Account Number: [id number] Date of Birth/Sex: Treating RN: February 18, 1971 (50 y.o. [name], [name] Primary Care Geselle Cardosa: [name] Other Clinician: Referring Donterius Filley: Treating Rakisha Pincock/Extender: Vernelle Emerald in Treatment: 131 Vital Signs Time Taken: 12:35 Temperature (F): 100.1 Height (in): 67 Pulse (bpm): 93 Weight (lbs): 125 Respiratory Rate  (breaths/min): 17 Body Mass Index (BMI): 19.6 Blood Pressure (mmHg): 196/100 Reference Range: 80 - 120 mg / dl Electronic Signature(s) Signed: 03/08/2021 4:34:42 PM By: Rhae Hammock RN Entered By: Rhae Hammock on 03/08/2021 12:43:48

## 2021-03-13 ENCOUNTER — Ambulatory Visit: Payer: Medicare HMO | Admitting: Endocrinology

## 2021-03-18 ENCOUNTER — Other Ambulatory Visit: Payer: Self-pay | Admitting: Endocrinology

## 2021-03-18 DIAGNOSIS — E1051 Type 1 diabetes mellitus with diabetic peripheral angiopathy without gangrene: Secondary | ICD-10-CM

## 2021-03-20 ENCOUNTER — Telehealth: Payer: Self-pay | Admitting: Endocrinology

## 2021-03-20 DIAGNOSIS — E1065 Type 1 diabetes mellitus with hyperglycemia: Secondary | ICD-10-CM

## 2021-03-20 MED ORDER — DEXCOM G6 TRANSMITTER MISC: 1.0000 | 3 refills | Status: DC

## 2021-03-20 MED ORDER — DEXCOM G6 RECEIVER DEVI
1.0000 | 0 refills | Status: DC
Start: 2021-03-20 — End: 2021-03-21

## 2021-03-20 MED ORDER — DEXCOM G6 SENSOR MISC: 1.0000 | 3 refills | Status: DC

## 2021-03-20 NOTE — Telephone Encounter (Signed)
MEDICATION: Continuous Blood Gluc Receiver (DEXCOM G6 RECEIVER) DEVI Continuous Blood Gluc Sensor (DEXCOM G6 SENSOR) MISC Continuous Blood Gluc Transmit (DEXCOM G6 TRANSMITTER) MISC  PHARMACY:   [id number] [address], [place] Phone:  [phone]  Fax:  [phone]      HAS THE PATIENT CONTACTED THEIR PHARMACY?  YES  IS THIS A 90 DAY SUPPLY : YES  IS PATIENT OUT OF MEDICATION: YES  IF NOT; HOW MUCH IS LEFT:   LAST APPOINTMENT DATE: @12 /17/2022  NEXT APPOINTMENT DATE:@Visit  date not found  DO WE HAVE YOUR PERMISSION TO LEAVE A DETAILED MESSAGE?:  OTHER COMMENTS: Please rush Pt is out for 2 weeks.   **Let patient know to contact pharmacy at the end of the day to make sure medication is ready. **  ** Please notify patient to allow 48-72 hours to process**  **Encourage patient to contact the pharmacy for refills or they can request refills through [facility]**

## 2021-03-20 NOTE — Telephone Encounter (Signed)
MEDICATION: Continuous Blood Gluc Sensor (DEXCOM G6 SENSOR) MISC Continuous Blood Gluc Transmit (DEXCOM G6 TRANSMITTER) MISC  PHARMACY:  [facility]  HAS THE PATIENT CONTACTED THEIR PHARMACY?  YES  IS THIS A 90 DAY SUPPLY : YES  IS PATIENT OUT OF MEDICATION: YES  IF NOT; HOW MUCH IS LEFT:   LAST APPOINTMENT DATE: @12 /19/2022  NEXT APPOINTMENT DATE:@Visit  date not found  DO WE HAVE YOUR PERMISSION TO LEAVE A DETAILED MESSAGE?:  OTHER COMMENTS: [facility] is requesting Fax confirmation on the above. Call [phone] or [phone]   **Let patient know to contact pharmacy at the end of the day to make sure medication is ready. **  ** Please notify patient to allow 48-72 hours to process**  **Encourage patient to contact the pharmacy for refills or they can request refills through [facility]**

## 2021-03-20 NOTE — Telephone Encounter (Signed)
RX for [employer] receiver, sensor and transmitter has now been sent to [facility].

## 2021-03-21 MED ORDER — DEXCOM G6 TRANSMITTER MISC: 1.0000 | 3 refills | Status: AC

## 2021-03-21 MED ORDER — DEXCOM G6 SENSOR MISC: 1.0000 | 3 refills | Status: AC

## 2021-03-21 MED ORDER — DEXCOM G6 RECEIVER DEVI: 1.0000 | 0 refills | Status: AC

## 2021-03-21 NOTE — Telephone Encounter (Signed)
Script sent to [facility] as requested by patient.

## 2021-03-21 NOTE — Addendum Note (Signed)
Addended by: Jefferson Fuel on: 03/21/2021 01:02 PM   Modules accepted: Orders

## 2021-03-22 ENCOUNTER — Inpatient Hospital Stay (HOSPITAL_COMMUNITY)
Admission: EM | Admit: 2021-03-22 | Discharge: 2021-04-02 | DRG: 637 | Disposition: E | Payer: Medicare HMO | Attending: Internal Medicine | Admitting: Internal Medicine

## 2021-03-22 ENCOUNTER — Emergency Department (HOSPITAL_COMMUNITY): Payer: Medicare HMO

## 2021-03-22 DIAGNOSIS — G931 Anoxic brain damage, not elsewhere classified: Secondary | ICD-10-CM | POA: Diagnosis not present

## 2021-03-22 DIAGNOSIS — Z83438 Family history of other disorder of lipoprotein metabolism and other lipidemia: Secondary | ICD-10-CM

## 2021-03-22 DIAGNOSIS — F419 Anxiety disorder, unspecified: Secondary | ICD-10-CM | POA: Diagnosis present

## 2021-03-22 DIAGNOSIS — Z87891 Personal history of nicotine dependence: Secondary | ICD-10-CM

## 2021-03-22 DIAGNOSIS — Z794 Long term (current) use of insulin: Secondary | ICD-10-CM

## 2021-03-22 DIAGNOSIS — J9690 Respiratory failure, unspecified, unspecified whether with hypoxia or hypercapnia: Secondary | ICD-10-CM

## 2021-03-22 DIAGNOSIS — Z9641 Presence of insulin pump (external) (internal): Secondary | ICD-10-CM | POA: Diagnosis present

## 2021-03-22 DIAGNOSIS — J189 Pneumonia, unspecified organism: Secondary | ICD-10-CM | POA: Diagnosis not present

## 2021-03-22 DIAGNOSIS — M898X9 Other specified disorders of bone, unspecified site: Secondary | ICD-10-CM | POA: Diagnosis present

## 2021-03-22 DIAGNOSIS — E7800 Pure hypercholesterolemia, unspecified: Secondary | ICD-10-CM | POA: Diagnosis present

## 2021-03-22 DIAGNOSIS — Z89431 Acquired absence of right foot: Secondary | ICD-10-CM

## 2021-03-22 DIAGNOSIS — Z818 Family history of other mental and behavioral disorders: Secondary | ICD-10-CM

## 2021-03-22 DIAGNOSIS — E10641 Type 1 diabetes mellitus with hypoglycemia with coma: Secondary | ICD-10-CM | POA: Diagnosis present

## 2021-03-22 DIAGNOSIS — D631 Anemia in chronic kidney disease: Secondary | ICD-10-CM | POA: Diagnosis present

## 2021-03-22 DIAGNOSIS — R1310 Dysphagia, unspecified: Secondary | ICD-10-CM | POA: Diagnosis not present

## 2021-03-22 DIAGNOSIS — Z978 Presence of other specified devices: Secondary | ICD-10-CM

## 2021-03-22 DIAGNOSIS — N2581 Secondary hyperparathyroidism of renal origin: Secondary | ICD-10-CM | POA: Diagnosis present

## 2021-03-22 DIAGNOSIS — E1051 Type 1 diabetes mellitus with diabetic peripheral angiopathy without gangrene: Secondary | ICD-10-CM | POA: Diagnosis present

## 2021-03-22 DIAGNOSIS — Z86718 Personal history of other venous thrombosis and embolism: Secondary | ICD-10-CM

## 2021-03-22 DIAGNOSIS — J810 Acute pulmonary edema: Secondary | ICD-10-CM | POA: Diagnosis not present

## 2021-03-22 DIAGNOSIS — E1022 Type 1 diabetes mellitus with diabetic chronic kidney disease: Secondary | ICD-10-CM | POA: Diagnosis present

## 2021-03-22 DIAGNOSIS — Z880 Allergy status to penicillin: Secondary | ICD-10-CM

## 2021-03-22 DIAGNOSIS — I120 Hypertensive chronic kidney disease with stage 5 chronic kidney disease or end stage renal disease: Secondary | ICD-10-CM | POA: Diagnosis present

## 2021-03-22 DIAGNOSIS — Z66 Do not resuscitate: Secondary | ICD-10-CM | POA: Diagnosis not present

## 2021-03-22 DIAGNOSIS — R7989 Other specified abnormal findings of blood chemistry: Secondary | ICD-10-CM | POA: Diagnosis not present

## 2021-03-22 DIAGNOSIS — E875 Hyperkalemia: Secondary | ICD-10-CM | POA: Diagnosis present

## 2021-03-22 DIAGNOSIS — E1100 Type 2 diabetes mellitus with hyperosmolarity without nonketotic hyperglycemic-hyperosmolar coma (NKHHC): Secondary | ICD-10-CM | POA: Diagnosis present

## 2021-03-22 DIAGNOSIS — Z992 Dependence on renal dialysis: Secondary | ICD-10-CM

## 2021-03-22 DIAGNOSIS — Z881 Allergy status to other antibiotic agents status: Secondary | ICD-10-CM

## 2021-03-22 DIAGNOSIS — R4182 Altered mental status, unspecified: Secondary | ICD-10-CM | POA: Diagnosis not present

## 2021-03-22 DIAGNOSIS — Z452 Encounter for adjustment and management of vascular access device: Secondary | ICD-10-CM

## 2021-03-22 DIAGNOSIS — J9601 Acute respiratory failure with hypoxia: Secondary | ICD-10-CM | POA: Diagnosis present

## 2021-03-22 DIAGNOSIS — Z0189 Encounter for other specified special examinations: Secondary | ICD-10-CM

## 2021-03-22 DIAGNOSIS — Z7982 Long term (current) use of aspirin: Secondary | ICD-10-CM

## 2021-03-22 DIAGNOSIS — Z515 Encounter for palliative care: Secondary | ICD-10-CM

## 2021-03-22 DIAGNOSIS — Z888 Allergy status to other drugs, medicaments and biological substances status: Secondary | ICD-10-CM

## 2021-03-22 DIAGNOSIS — Z4659 Encounter for fitting and adjustment of other gastrointestinal appliance and device: Secondary | ICD-10-CM

## 2021-03-22 DIAGNOSIS — E1069 Type 1 diabetes mellitus with other specified complication: Principal | ICD-10-CM | POA: Diagnosis present

## 2021-03-22 DIAGNOSIS — R451 Restlessness and agitation: Secondary | ICD-10-CM | POA: Diagnosis not present

## 2021-03-22 DIAGNOSIS — G4733 Obstructive sleep apnea (adult) (pediatric): Secondary | ICD-10-CM | POA: Diagnosis present

## 2021-03-22 DIAGNOSIS — Z20822 Contact with and (suspected) exposure to covid-19: Secondary | ICD-10-CM | POA: Diagnosis present

## 2021-03-22 DIAGNOSIS — Z9842 Cataract extraction status, left eye: Secondary | ICD-10-CM

## 2021-03-22 DIAGNOSIS — D72829 Elevated white blood cell count, unspecified: Secondary | ICD-10-CM

## 2021-03-22 DIAGNOSIS — G9341 Metabolic encephalopathy: Secondary | ICD-10-CM | POA: Diagnosis present

## 2021-03-22 DIAGNOSIS — Z993 Dependence on wheelchair: Secondary | ICD-10-CM

## 2021-03-22 DIAGNOSIS — E871 Hypo-osmolality and hyponatremia: Secondary | ICD-10-CM | POA: Diagnosis not present

## 2021-03-22 DIAGNOSIS — H518 Other specified disorders of binocular movement: Secondary | ICD-10-CM | POA: Diagnosis not present

## 2021-03-22 DIAGNOSIS — K761 Chronic passive congestion of liver: Secondary | ICD-10-CM | POA: Diagnosis present

## 2021-03-22 DIAGNOSIS — E1065 Type 1 diabetes mellitus with hyperglycemia: Secondary | ICD-10-CM | POA: Diagnosis present

## 2021-03-22 DIAGNOSIS — I953 Hypotension of hemodialysis: Secondary | ICD-10-CM | POA: Diagnosis not present

## 2021-03-22 DIAGNOSIS — E876 Hypokalemia: Secondary | ICD-10-CM | POA: Diagnosis not present

## 2021-03-22 DIAGNOSIS — Z79899 Other long term (current) drug therapy: Secondary | ICD-10-CM

## 2021-03-22 DIAGNOSIS — N186 End stage renal disease: Secondary | ICD-10-CM | POA: Diagnosis present

## 2021-03-22 DIAGNOSIS — Z781 Physical restraint status: Secondary | ICD-10-CM

## 2021-03-22 DIAGNOSIS — Z9841 Cataract extraction status, right eye: Secondary | ICD-10-CM

## 2021-03-22 DIAGNOSIS — E870 Hyperosmolality and hypernatremia: Secondary | ICD-10-CM | POA: Diagnosis present

## 2021-03-22 DIAGNOSIS — Z89422 Acquired absence of other left toe(s): Secondary | ICD-10-CM

## 2021-03-22 DIAGNOSIS — G936 Cerebral edema: Secondary | ICD-10-CM | POA: Diagnosis not present

## 2021-03-22 DIAGNOSIS — Z9289 Personal history of other medical treatment: Secondary | ICD-10-CM

## 2021-03-22 DIAGNOSIS — E1040 Type 1 diabetes mellitus with diabetic neuropathy, unspecified: Secondary | ICD-10-CM | POA: Diagnosis present

## 2021-03-22 LAB — I-STAT VENOUS BLOOD GAS, ED
Acid-Base Excess: 1 mmol/L (ref 0.0–2.0)
Bicarbonate: 27.7 mmol/L (ref 20.0–28.0)
Calcium, Ion: 1.06 mmol/L — ABNORMAL LOW (ref 1.15–1.40)
HCT: 40 % (ref 36.0–46.0)
Hemoglobin: 13.6 g/dL (ref 12.0–15.0)
O2 Saturation: 92 %
Potassium: 5.6 mmol/L — ABNORMAL HIGH (ref 3.5–5.1)
Sodium: 125 mmol/L — ABNORMAL LOW (ref 135–145)
TCO2: 29 mmol/L (ref 22–32)
pCO2, Ven: 50.4 mmHg (ref 44.0–60.0)
pH, Ven: 7.349 (ref 7.250–7.430)
pO2, Ven: 69 mmHg — ABNORMAL HIGH (ref 32.0–45.0)

## 2021-03-22 LAB — HEPATIC FUNCTION PANEL
ALT: 47 U/L — ABNORMAL HIGH (ref 0–44)
AST: 63 U/L — ABNORMAL HIGH (ref 15–41)
Albumin: 3.5 g/dL (ref 3.5–5.0)
Alkaline Phosphatase: 383 U/L — ABNORMAL HIGH (ref 38–126)
Bilirubin, Direct: 0.3 mg/dL — ABNORMAL HIGH (ref 0.0–0.2)
Indirect Bilirubin: 0.8 mg/dL (ref 0.3–0.9)
Total Bilirubin: 1.1 mg/dL (ref 0.3–1.2)
Total Protein: 7.7 g/dL (ref 6.5–8.1)

## 2021-03-22 LAB — BETA-HYDROXYBUTYRIC ACID: Beta-Hydroxybutyric Acid: 3.12 mmol/L — ABNORMAL HIGH (ref 0.05–0.27)

## 2021-03-22 LAB — CBC
HCT: 36.1 % (ref 36.0–46.0)
Hemoglobin: 11 g/dL — ABNORMAL LOW (ref 12.0–15.0)
MCH: 25.3 pg — ABNORMAL LOW (ref 26.0–34.0)
MCHC: 30.5 g/dL (ref 30.0–36.0)
MCV: 83 fL (ref 80.0–100.0)
Platelets: 266 10*3/uL (ref 150–400)
RBC: 4.35 MIL/uL (ref 3.87–5.11)
RDW: 18.5 % — ABNORMAL HIGH (ref 11.5–15.5)
WBC: 12.4 10*3/uL — ABNORMAL HIGH (ref 4.0–10.5)
nRBC: 0 % (ref 0.0–0.2)

## 2021-03-22 LAB — CBG MONITORING, ED
Glucose-Capillary: >600 mg/dL (ref 70–99)
Glucose-Capillary: >600 mg/dL (ref 70–99)
Glucose-Capillary: >600 mg/dL (ref 70–99)

## 2021-03-22 LAB — LACTIC ACID, PLASMA
Lactic Acid, Venous: 2 mmol/L (ref 0.5–1.9)
Lactic Acid, Venous: 2 mmol/L (ref 0.5–1.9)

## 2021-03-22 LAB — I-STAT BETA HCG BLOOD, ED (MC, WL, AP ONLY): I-stat hCG, quantitative: <5 m[IU]/mL (ref <5)

## 2021-03-22 LAB — BASIC METABOLIC PANEL
Anion gap: 18 — ABNORMAL HIGH (ref 5–15)
BUN: 44 mg/dL — ABNORMAL HIGH (ref 6–20)
CO2: 24 mmol/L (ref 22–32)
Calcium: 9 mg/dL (ref 8.9–10.3)
Chloride: 85 mmol/L — ABNORMAL LOW (ref 98–111)
Creatinine, Ser: 6.48 mg/dL — ABNORMAL HIGH (ref 0.44–1.00)
GFR, Estimated: 7 mL/min — ABNORMAL LOW (ref >60)
Glucose, Bld: 931 mg/dL (ref 70–99)
Potassium: 5.7 mmol/L — ABNORMAL HIGH (ref 3.5–5.1)
Sodium: 127 mmol/L — ABNORMAL LOW (ref 135–145)

## 2021-03-22 LAB — OSMOLALITY: Osmolality: 340 mOsm/kg (ref 275–295)

## 2021-03-22 LAB — RESP PANEL BY RT-PCR (FLU A&B, COVID) ARPGX2
Influenza A by PCR: NEGATIVE
Influenza B by PCR: NEGATIVE
SARS Coronavirus 2 by RT PCR: NEGATIVE

## 2021-03-22 LAB — ETHANOL: Alcohol, Ethyl (B): <10 mg/dL (ref <10)

## 2021-03-22 MED ORDER — DEXTROSE IN LACTATED RINGERS 5 % IV SOLN: INTRAVENOUS | Status: AC

## 2021-03-22 MED ORDER — ETOMIDATE 2 MG/ML IV SOLN: 20.0000 mg | Freq: Once | INTRAVENOUS | Status: AC

## 2021-03-22 MED ORDER — LACTATED RINGERS IV BOLUS
1000.0000 mL | Freq: Once | INTRAVENOUS | Status: AC
  Administered 2021-03-22: 19:00:00 1000 mL via INTRAVENOUS

## 2021-03-22 MED ORDER — LORAZEPAM 2 MG/ML IJ SOLN
0.5000 mg | Freq: Once | INTRAMUSCULAR | Status: AC
  Administered 2021-03-22: 21:00:00 0.5 mg via INTRAVENOUS
  Filled 2021-03-22: qty 1

## 2021-03-22 MED ORDER — ROCURONIUM BROMIDE 10 MG/ML (PF) SYRINGE
75.0000 mg | PREFILLED_SYRINGE | Freq: Once | INTRAVENOUS | Status: AC
  Administered 2021-03-22: 75 mg via INTRAVENOUS

## 2021-03-22 MED ORDER — SUCCINYLCHOLINE CHLORIDE 20 MG/ML IJ SOLN
INTRAMUSCULAR | Status: AC
  Filled 2021-03-22: qty 1

## 2021-03-22 MED ORDER — LACTATED RINGERS IV SOLN: INTRAVENOUS | Status: DC

## 2021-03-22 MED ORDER — ROCURONIUM BROMIDE 50 MG/5ML IV SOLN
INTRAVENOUS | Status: DC | PRN
  Administered 2021-03-22: 75 mg via INTRAVENOUS

## 2021-03-22 MED ORDER — LIDOCAINE HCL (CARDIAC) PF 100 MG/5ML IV SOSY
PREFILLED_SYRINGE | INTRAVENOUS | Status: AC
  Filled 2021-03-22: qty 5

## 2021-03-22 MED ORDER — DEXTROSE 50 % IV SOLN
0.0000 mL | INTRAVENOUS | Status: DC | PRN
  Filled 2021-03-22: qty 50

## 2021-03-22 MED ORDER — ETOMIDATE 2 MG/ML IV SOLN
INTRAVENOUS | Status: AC
  Administered 2021-03-22: 20 mg via INTRAVENOUS
  Filled 2021-03-22: qty 20

## 2021-03-22 MED ORDER — ETOMIDATE 2 MG/ML IV SOLN
INTRAVENOUS | Status: DC | PRN
  Administered 2021-03-22: 20 mg via INTRAVENOUS

## 2021-03-22 MED ORDER — ONDANSETRON HCL 4 MG/2ML IJ SOLN
4.0000 mg | Freq: Once | INTRAMUSCULAR | Status: AC
  Administered 2021-03-22: 21:00:00 4 mg via INTRAVENOUS
  Filled 2021-03-22: qty 2

## 2021-03-22 MED ORDER — PROPOFOL 1000 MG/100ML IV EMUL
5.0000 ug/kg/min | INTRAVENOUS | Status: DC
Start: 2021-03-23 — End: 2021-03-24
  Administered 2021-03-23 (×2): 80 ug/kg/min via INTRAVENOUS
  Administered 2021-03-23: 5 ug/kg/min via INTRAVENOUS
  Administered 2021-03-23 (×3): 80 ug/kg/min via INTRAVENOUS
  Administered 2021-03-24: 03:00:00 40 ug/kg/min via INTRAVENOUS
  Filled 2021-03-22 (×7): qty 100

## 2021-03-22 MED ORDER — ROCURONIUM BROMIDE 50 MG/5ML IV SOLN
INTRAVENOUS | Status: AC
  Filled 2021-03-22: qty 2

## 2021-03-22 MED ORDER — INSULIN REGULAR(HUMAN) IN NACL 100-0.9 UT/100ML-% IV SOLN
INTRAVENOUS | Status: DC
  Administered 2021-03-22: 21:00:00 5.5 [IU]/h via INTRAVENOUS
  Filled 2021-03-22: qty 100

## 2021-03-22 MED ORDER — LORAZEPAM 2 MG/ML IJ SOLN: 0.5000 mg | Freq: Once | INTRAMUSCULAR | Status: DC | PRN

## 2021-03-22 NOTE — ED Triage Notes (Signed)
Pt from home via [facility] for AMS/ hyperglycemic. CBG monitor on R arm, connected to insulin pump- has been messed up, believe insulin has been given at incorrect dose x3-4 days. Not visualized on arrival to ED Dialysis T,Th,Sat, complete session yesterday (Tues), L sided access Combative en route, no meds PTA, no IV access

## 2021-03-22 NOTE — ED Provider Notes (Signed)
[facility] EMERGENCY DEPARTMENT Provider Note   CSN: [id number] Arrival date & time: 03/27/2021  1832     History Chief Complaint  Patient presents with   Hyperglycemia   Altered Mental Status    [name] is a 50 y.o. female.  Presented to the emergency room with concern for hyperglycemia, altered mental status.  EMS report that there was concern insulin was not being administered correctly, patient also had complained of nausea and vomiting.  Combative in route.  Glucose greater than 600.Marland Kitchen  History limited due to altered mental status.  Level 5 caveat.  HPI     Past Medical History:  Diagnosis Date   Anemia    Arthritis    Bronchitis    C. difficile diarrhea 09/26/2014   Cataracts, both eyes    had surgery to remove   Cellulitis of right foot 06/02/2014   CKD (chronic kidney disease) stage 3, GFR 30-59 ml/min (HCC)    Dialysis T, TH, Sat   Diabetic ulcer of right foot ([place]) 06/02/2014   DVT (deep venous thrombosis) ([place]) 10/2014   one in each one in leg- PICC line , one in right and left arm   H/O seasonal allergies    History of blood transfusion    Hypercholesteremia    Hypertension    Left foot infection    Neuromuscular disorder (HCC)    lower legs and feet   Neuropathy in diabetes [facility])    Peripheral vascular disease ([place])    Sleep apnea    does not use cpap   Staphylococcus aureus bacteremia 10/2014   Type 1 diabetes ([place])    onset age 10    Patient Active Problem List   Diagnosis Date Noted   Hyperosmolar hyperglycemic state (HHS) ([place]) 03/30/2021   DKA, type 1 ([place]) 35/57/3220   Acute metabolic encephalopathy 25/42/7062   Hypertensive urgency 11/21/2020   DKA (diabetic ketoacidosis) ([place]) 11/21/2020   Precordial chest pain 04/27/2020   Allergy, unspecified, initial encounter 11/26/2019   Anaphylactic shock, unspecified, initial encounter 11/26/2019   Encounter for removal of sutures 11/24/2019   Chronic venous hypertension  (idiopathic) with ulcer and inflammation of unspecified lower extremity ([place]) 09/08/2019   ESRD (end stage renal disease) on dialysis ([place])    Anemia in chronic kidney disease 08/04/2018   Coagulation defect, unspecified ([place]) 08/04/2018   Secondary hyperparathyroidism of renal origin ([place]) 08/04/2018   Osteomyelitis of right foot ([place]) 07/29/2018   Menorrhagia with irregular cycle 11/27/2017   Gangrene of left foot ([place]) 05/08/2017   Osteomyelitis of left foot ([place]) 05/08/2017   Diabetic foot infection ([place])    Diabetic foot ulcer ([place]) 05/05/2017   Cellulitis 05/04/2017   Diabetes mellitus type 1 with peripheral artery disease ([place]) 01/16/2017   Anxiety 03/23/2016   Compulsive skin picking 03/23/2016   Sepsis due to methicillin resistant Staphylococcus aureus (MRSA) without acute organ dysfunction ([place]) 11/04/2015   Heart murmur 11/03/2015   Acute renal failure superimposed on stage 4 chronic kidney disease ([place]) 05/31/2015   PAD (peripheral artery disease) ([place]) 04/22/2015   Peripheral edema 12/28/2014   Protein-calorie malnutrition, moderate ([place]) 10/18/2014   History of DVT (deep vein thrombosis)    MRSA bacteremia    Diabetic infection of left foot ([place]) 10/13/2014   Chronic anemia 10/13/2014   Status post transmetatarsal amputation of right foot ([place]) 09/26/2014   Severe sepsis ([place]) 09/01/2014   Diabetes mellitus type 1, uncontrolled, insulin dependent 11/09/2013   DM type 1  causing renal disease ([place]) 06/13/2013   Osteomyelitis ([place]) 06/12/2013   CKD (chronic kidney disease), stage III ([place]) 06/12/2013   Charcot foot due to diabetes mellitus ([place]) 05/09/2011   Foot ulcer due to secondary DM ([place]) 05/09/2011   Type 1 diabetes mellitus with neurological manifestations, uncontrolled    Hypertension    Hypercholesteremia     Past Surgical History:  Procedure Laterality Date   AMPUTATION TOE Left 07/24/2015   5th toe and Hallux amputation   AV FISTULA PLACEMENT Left  08/06/2018   Procedure: INSERTION OF BASCILIC VEN TRANSPOSITION 1ST STAGE;  Surgeon: Rosetta Posner, MD;  Location: [facility];  Service: Vascular;  Laterality: Left;   [place] Left 04/29/2019   Procedure: SECOND STAGE BASCILIC VEIN TRANSPOSITION LEFT ARM;  Surgeon: Rosetta Posner, MD;  Location: [facility];  Service: Vascular;  Laterality: Left;   CESAREAN SECTION     x 1   EXOSTECTECTOMY TOE Left 04/11/2016   Procedure: EXOSTECTECTOMY CUBOID AND 5TH METATARSAL AND PLACEMENT OF ANTIBIOTIC BEADS;  Surgeon: Edrick Kins, DPM;  Location: [place];  Service: Podiatry;  Laterality: Left;   EYE SURGERY Bilateral    removed cataracts - laser   FEMORAL-POPLITEAL BYPASS GRAFT Left 05/03/2015   FOOT AMPUTATION THROUGH METATARSAL Right 2016   hemrrhoidectomy     I & D EXTREMITY Right 06/10/2014   Procedure: IRRIGATION AND DEBRIDEMENT Right Foot;  Surgeon: Newt Minion, MD;  Location: [place];  Service: Orthopedics;  Laterality: Right;   I & D EXTREMITY Right 07/31/2018   Procedure: IRRIGATION AND DEBRIDEMENT EXTREMITY;  Surgeon: Edrick Kins, DPM;  Location: [place];  Service: Podiatry;  Laterality: Right;   I & D EXTREMITY Right 08/04/2018   Procedure: IRRIGATION AND DEBRIDEMENT FOOT with placement of wound vac;  Surgeon: Edrick Kins, DPM;  Location: [place];  Service: Podiatry;  Laterality: Right;   INCISION AND DRAINAGE Left 04/11/2016   Procedure: INCISION AND DRAINAGE A DEEP COMPLICATED WOUND OF LEFT FOOT;  Surgeon: Edrick Kins, DPM;  Location: [place];  Service: Podiatry;  Laterality: Left;   INSERTION OF DIALYSIS CATHETER Right 08/06/2018   Procedure: INSERTION OF TUNNELED DIALYSIS CATHETER;  Surgeon: Rosetta Posner, MD;  [facility];  Service: Vascular;  Laterality: Right;   IR FLUORO GUIDE CV LINE RIGHT  09/24/2017   IR FLUORO GUIDE CV LINE RIGHT  08/01/2018   IR REMOVAL TUN CV CATH W/O FL  11/22/2017   IR US GUIDE VASC ACCESS RIGHT  09/24/2017   IR US GUIDE VASC ACCESS RIGHT  08/01/2018    IRRIGATION AND DEBRIDEMENT ABSCESS Left 05/06/2017   Procedure: IRRIGATION AND DEBRIDEMENT ABSCESS LEFT FOOT;  Surgeon: Edrick Kins, DPM;  Location: [facility];  Service: Podiatry;  Laterality: Left;   PERIPHERAL VASCULAR BALLOON ANGIOPLASTY Left 01/20/2020   Procedure: PERIPHERAL VASCULAR BALLOON ANGIOPLASTY;  Surgeon: Marty Heck, MD;  Location: [facility];  Service: Cardiovascular;  Laterality: Left;  Arm fistula   PERIPHERALLY INSERTED CENTRAL CATHETER INSERTION     SKIN GRAFT Right 06/15/2014   SKIN GRAFT Left 06/2015   foot    SKIN SPLIT GRAFT Right 06/15/2014   Procedure: SPLIT THICKNESS SKIN GRAFT RIGHT FOOT;  Surgeon: Newt Minion, MD;  Location: [place];  Service: Orthopedics;  Laterality: Right;   TRANSMETATARSAL AMPUTATION Right    TRANSMETATARSAL AMPUTATION Right 09/11/2014   WISDOM TOOTH EXTRACTION       OB History  Gravida  1   Para      Term      Preterm      AB      Living         SAB      IAB      Ectopic      Multiple      Live Births              Family History  Problem Relation Age of Onset   Hyperlipidemia Mother    Dementia Mother     Social History   Tobacco Use   Smoking status: Former    Years: 15.00    Types: Cigarettes    Quit date: 05/16/2008    Years since quitting: 12.8   Smokeless tobacco: Never  Vaping Use   Vaping Use: Never used  Substance Use Topics   Alcohol use: Not Currently    Comment: Occasional   Drug use: No    Home Medications Prior to Admission medications   Medication Sig Start Date End Date Taking? Authorizing Provider  albuterol (PROVENTIL HFA;VENTOLIN HFA) 108 (90 BASE) MCG/ACT inhaler Inhale 2 puffs into the lungs every 6 (six) hours as needed for wheezing or shortness of breath.     [provider]  amLODipine (NORVASC) 5 MG tablet Take 5 mg by mouth daily. 11/07/20   [provider]  aspirin EC 81 MG tablet Take 81 mg by mouth daily.    [provider]   B Complex-C-Zn-Folic Acid (DIALYVITE 177 WITH ZINC) 0.8 MG TABS Take 1 tablet by mouth daily. 03/09/19   [provider]  calcitRIOL (ROCALTROL) 0.5 MCG capsule Take 1 capsule daily 08/13/17   Elayne Snare, MD  calcium acetate (PHOSLO) 667 MG capsule Take 2 capsules (1,334 mg total) by mouth 3 (three) times daily with meals. 08/08/18   Danford, Suann Larry, MD  Continuous Blood Gluc Receiver ([place]) [place] 1 each by Does not apply route See admin instructions. Use Dexcom Receiver to monitor blood sugar continuously. 03/21/21   Elayne Snare, MD  Continuous Blood Gluc Sensor (DEXCOM G6 SENSOR) MISC 1 each by Does not apply route See admin instructions. Use one sensors once every 10 days to monitor blood sugars. 03/21/21   Elayne Snare, MD  Continuous Blood Gluc Transmit (DEXCOM G6 TRANSMITTER) MISC 1 each by Does not apply route See admin instructions. Use one transmitter once every 90 days. 03/21/21   Elayne Snare, MD  CONTOUR NEXT TEST test strip USE AS INSTRUCTED TO CHECK BLOOD SUGAR 4 TIMES A DAY. DX E10.65 06/08/19   Elayne Snare, MD  Cyanocobalamin (B-12) 5000 MCG CAPS Take 5,000 mcg by mouth daily.    [provider]  diphenhydrAMINE (BENADRYL) 25 MG tablet Take 25 mg by mouth daily as needed.     [provider]  folic acid (FOLVITE) 939 MCG tablet Take 400 mcg by mouth daily.    [provider]  insulin aspart (NOVOLOG) 100 UNIT/ML injection USE A MAX OF 65 UNITS DAILY VIA INSULIN PUMP 06/24/20   Elayne Snare, MD  Insulin Human (INSULIN PUMP) SOLN Inject into the skin. Insulin aspart (Novolog) 100 unit/ml---MAX 65 UNITS DAILY    [provider]  lidocaine-prilocaine (EMLA) cream Apply 1 application topically daily as needed. Port access 08/16/20   [provider]  norethindrone (AYGESTIN) 5 MG tablet Take 5 mg by mouth See admin instructions. 5mg  daily for 14 days, then off for 14 days, then  repeat cycle. Patient not taking: Reported on  01/13/2021 02/17/19   [provider]  NOVOLOG 100 UNIT/ML injection USE a max OF 65 UNITS EVERY DAY via insulin pump 03/20/21   Elayne Snare, MD  Omega-3 Fatty Acids (OMEGA 3 500) 500 MG CAPS Take 500 mg by mouth daily.    [provider]  ondansetron (ZOFRAN ODT) 4 MG disintegrating tablet Take 1 tablet (4 mg total) by mouth every 8 (eight) hours as needed for nausea or vomiting. 10/08/20   Luna Fuse, MD  pravastatin (PRAVACHOL) 40 MG tablet Take 1 tablet (40 mg total) by mouth daily. 11/07/20   Elayne Snare, MD    Allergies    Cleocin [clindamycin hcl], Lisinopril, Amoxicillin, and Bactrim [sulfamethoxazole-trimethoprim]  Review of Systems   Review of Systems  Unable to perform ROS: Mental status change   Physical Exam Updated Vital Signs BP 139/67    Pulse (!) 101    Temp 97.6 F (36.4 C) (Axillary)    Resp (!) 25    SpO2 91%   Physical Exam Constitutional:      Comments: Lying in bed  HENT:     Head: Normocephalic and atraumatic.     Nose: Nose normal.     Mouth/Throat:     Mouth: Mucous membranes are dry.  Eyes:     Pupils: Pupils are equal, round, and reactive to light.  Cardiovascular:     Rate and Rhythm: Tachycardia present.     Pulses: Normal pulses.     Heart sounds: No murmur heard. Pulmonary:     Comments: Mild tachypnea, breath sounds clear bilaterally Abdominal:     General: Abdomen is flat.     Tenderness: There is no abdominal tenderness.     Hernia: No hernia is present.  Musculoskeletal:        General: No deformity or signs of injury.  Skin:    General: Skin is warm and dry.     Capillary Refill: Capillary refill takes less than 2 seconds.  Neurological:     GCS: GCS eye subscore is 3. GCS verbal subscore is 4. GCS motor subscore is 5.    ED Results / Procedures / Treatments   Labs (all labs ordered are listed, but only abnormal results are displayed) Labs Reviewed  BASIC METABOLIC PANEL - Abnormal; Notable for the following  components:      Result Value   Sodium 127 (*)    Potassium 5.7 (*)    Chloride 85 (*)    Glucose, Bld 931 (*)    BUN 44 (*)    Creatinine, Ser 6.48 (*)    GFR, Estimated 7 (*)    Anion gap 18 (*)    All other components within normal limits  CBC - Abnormal; Notable for the following components:   WBC 12.4 (*)    Hemoglobin 11.0 (*)    MCH 25.3 (*)    RDW 18.5 (*)    All other components within normal limits  HEPATIC FUNCTION PANEL - Abnormal; Notable for the following components:   AST 63 (*)    ALT 47 (*)    Alkaline Phosphatase 383 (*)    Bilirubin, Direct 0.3 (*)    All other components within normal limits  BETA-HYDROXYBUTYRIC ACID - Abnormal; Notable for the following components:   Beta-Hydroxybutyric Acid 3.12 (*)    All other components within normal limits  LACTIC ACID, PLASMA - Abnormal; Notable for the following components:   Lactic Acid, Venous  2.0 (*)    All other components within normal limits  LACTIC ACID, PLASMA - Abnormal; Notable for the following components:   Lactic Acid, Venous 2.0 (*)    All other components within normal limits  OSMOLALITY - Abnormal; Notable for the following components:   Osmolality 340 (*)    All other components within normal limits  CBG MONITORING, ED - Abnormal; Notable for the following components:   Glucose-Capillary >600 (*)    All other components within normal limits  I-STAT VENOUS BLOOD GAS, ED - Abnormal; Notable for the following components:   pO2, Ven 69.0 (*)    Sodium 125 (*)    Potassium 5.6 (*)    Calcium, Ion 1.06 (*)    All other components within normal limits  CBG MONITORING, ED - Abnormal; Notable for the following components:   Glucose-Capillary >600 (*)    All other components within normal limits  CBG MONITORING, ED - Abnormal; Notable for the following components:   Glucose-Capillary >600 (*)    All other components within normal limits  RESP PANEL BY RT-PCR (FLU A&B, COVID) ARPGX2  ETHANOL   URINALYSIS, ROUTINE W REFLEX MICROSCOPIC  RAPID URINE DRUG SCREEN, HOSP PERFORMED  I-STAT BETA HCG BLOOD, ED ([facility], [facility], [facility] ONLY)    EKG EKG Interpretation  Date/Time:  Wednesday March 22 2021 21:30:36 EST Ventricular Rate:  102 PR Interval:  133 QRS Duration: 110 QT Interval:  356 QTC Calculation: 469 R Axis:   98 Text Interpretation: Sinus tachycardia Probable lateral infarct, old Baseline wander in lead(s) V2 When compared with ECG of 11/26/2020, No significant change was found Confirmed by Delora Fuel ([id number]) on 03/29/2021 11:12:44 PM  Radiology DG Chest Portable 1 View  Result Date: 03/09/2021 CLINICAL DATA:  Altered mental status, initial encounter EXAM: PORTABLE CHEST 1 VIEW COMPARISON:  11/21/2020 FINDINGS: Neva Seat is enlarged but stable. Aortic calcifications are noted. Lungs are well aerated bilaterally. Vascular congestion is seen with mild interstitial edema. No focal infiltrate or effusion is seen. No bony abnormality is noted. IMPRESSION: Changes consistent with mild CHF. No other focal abnormality is noted. Electronically Signed   By: Inez Catalina M.D.   On: 03/07/2021 20:54    Procedures .Critical Care Performed by: Lucrezia Starch, MD Authorized by: Lucrezia Starch, MD   Critical care provider statement:    Critical care time (minutes):  64   Critical care was necessary to treat or prevent imminent or life-threatening deterioration of the following conditions:  CNS failure or compromise, renal failure and metabolic crisis   Critical care was time spent personally by me on the following activities:  Development of treatment plan with patient or surrogate, discussions with consultants, evaluation of patient's response to treatment, examination of patient, ordering and review of laboratory studies, ordering and review of radiographic studies, ordering and performing treatments and interventions, pulse oximetry, re-evaluation of patient's condition and review of old  charts   Medications Ordered in ED Medications  insulin regular, human (MYXREDLIN) 100 units/ 100 mL infusion (5.5 Units/hr Intravenous New Bag/Given 03/10/2021 2051)  lactated ringers infusion ( Intravenous New Bag/Given 03/17/2021 2052)  dextrose 5 % in lactated ringers infusion (0 mLs Intravenous Hold 03/25/2021 2056)  dextrose 50 % solution 0-50 mL (has no administration in time range)  LORazepam (ATIVAN) injection 0.5 mg (has no administration in time range)  etomidate (AMIDATE) injection 20 mg (has no administration in time range)  rocuronium bromide 10 mg/mL (PF) syringe (has no administration in time  range)  lactated ringers bolus 1,000 mL (0 mLs Intravenous Stopped 03/12/2021 2041)  ondansetron (ZOFRAN) injection 4 mg (4 mg Intravenous Given 03/19/2021 2053)  LORazepam (ATIVAN) injection 0.5 mg (0.5 mg Intravenous Given 03/07/2021 2053)    ED Course  I have reviewed the triage vital signs and the nursing notes.  Pertinent labs & imaging results that were available during my care of the patient were reviewed by me and considered in my medical decision making (see chart for details).    MDM Rules/Calculators/A&P                         50 year old lady with history of diabetes, ESRD on dialysis presenting to ER with concern for altered mental status, nausea and vomiting.  EMS reported concern for possible insulin issue.  On arrival to ER, patient seemed very confused, somewhat obtunded but readily localizing pain in all 4 extremities, confused verbal response to incomprehensible sounds, appears to be protecting airway.  Hypertensive, mildly tachycardic and somewhat tachypneic.  Blood work concerning for profound hyperglycemia, pH is normal.  Suspect HHS.  Provided fluid modest fluid bolus but given dialysis status, hesitant to fluid overload.  Initiated insulin drip.  Unfortunately mental status has not improved and if anything has been worsening since getting to ER.  Had initially discussed with  hospitalist however due to current mental status, reached out to critical care to evaluate and admit patient.  Will also check head scan to rule out acute intracranial cause for her change in mental status though most likely suspect her metabolic derangements are the culprit.  At time of signout to Dr. Tyrone Nine, will likely plan to intubate patient to secure airway given worsening mental status and concern for inability to protect airway and need to check CT head.    Final Clinical Impression(s) / ED Diagnoses Final diagnoses:  Altered mental status, unspecified altered mental status type  Hyperosmolar hyperglycemic state (HHS) (HCC)  Leukocytosis, unspecified type    Rx / DC Orders ED Discharge Orders     None        Lucrezia Starch, MD 03/02/2021 2345

## 2021-03-23 ENCOUNTER — Observation Stay (HOSPITAL_COMMUNITY): Payer: Medicare HMO

## 2021-03-23 ENCOUNTER — Inpatient Hospital Stay (HOSPITAL_COMMUNITY): Payer: Medicare HMO

## 2021-03-23 DIAGNOSIS — I120 Hypertensive chronic kidney disease with stage 5 chronic kidney disease or end stage renal disease: Secondary | ICD-10-CM | POA: Diagnosis present

## 2021-03-23 DIAGNOSIS — K761 Chronic passive congestion of liver: Secondary | ICD-10-CM | POA: Diagnosis present

## 2021-03-23 DIAGNOSIS — I6389 Other cerebral infarction: Secondary | ICD-10-CM | POA: Diagnosis not present

## 2021-03-23 DIAGNOSIS — Z7189 Other specified counseling: Secondary | ICD-10-CM | POA: Diagnosis not present

## 2021-03-23 DIAGNOSIS — J9601 Acute respiratory failure with hypoxia: Secondary | ICD-10-CM | POA: Diagnosis present

## 2021-03-23 DIAGNOSIS — E1010 Type 1 diabetes mellitus with ketoacidosis without coma: Secondary | ICD-10-CM | POA: Diagnosis not present

## 2021-03-23 DIAGNOSIS — D72829 Elevated white blood cell count, unspecified: Secondary | ICD-10-CM | POA: Diagnosis not present

## 2021-03-23 DIAGNOSIS — I6782 Cerebral ischemia: Secondary | ICD-10-CM | POA: Diagnosis not present

## 2021-03-23 DIAGNOSIS — E109 Type 1 diabetes mellitus without complications: Secondary | ICD-10-CM | POA: Diagnosis not present

## 2021-03-23 DIAGNOSIS — E871 Hypo-osmolality and hyponatremia: Secondary | ICD-10-CM | POA: Diagnosis not present

## 2021-03-23 DIAGNOSIS — E1022 Type 1 diabetes mellitus with diabetic chronic kidney disease: Secondary | ICD-10-CM | POA: Diagnosis present

## 2021-03-23 DIAGNOSIS — N186 End stage renal disease: Secondary | ICD-10-CM | POA: Diagnosis present

## 2021-03-23 DIAGNOSIS — E870 Hyperosmolality and hypernatremia: Secondary | ICD-10-CM | POA: Diagnosis present

## 2021-03-23 DIAGNOSIS — E1040 Type 1 diabetes mellitus with diabetic neuropathy, unspecified: Secondary | ICD-10-CM | POA: Diagnosis present

## 2021-03-23 DIAGNOSIS — J988 Other specified respiratory disorders: Secondary | ICD-10-CM

## 2021-03-23 DIAGNOSIS — E1051 Type 1 diabetes mellitus with diabetic peripheral angiopathy without gangrene: Secondary | ICD-10-CM | POA: Diagnosis present

## 2021-03-23 DIAGNOSIS — Z515 Encounter for palliative care: Secondary | ICD-10-CM | POA: Diagnosis not present

## 2021-03-23 DIAGNOSIS — J189 Pneumonia, unspecified organism: Secondary | ICD-10-CM | POA: Diagnosis not present

## 2021-03-23 DIAGNOSIS — E861 Hypovolemia: Secondary | ICD-10-CM

## 2021-03-23 DIAGNOSIS — Z20822 Contact with and (suspected) exposure to covid-19: Secondary | ICD-10-CM | POA: Diagnosis present

## 2021-03-23 DIAGNOSIS — R4182 Altered mental status, unspecified: Secondary | ICD-10-CM | POA: Diagnosis present

## 2021-03-23 DIAGNOSIS — G9341 Metabolic encephalopathy: Secondary | ICD-10-CM | POA: Diagnosis present

## 2021-03-23 DIAGNOSIS — Z9641 Presence of insulin pump (external) (internal): Secondary | ICD-10-CM | POA: Diagnosis present

## 2021-03-23 DIAGNOSIS — Z794 Long term (current) use of insulin: Secondary | ICD-10-CM | POA: Diagnosis not present

## 2021-03-23 DIAGNOSIS — Z66 Do not resuscitate: Secondary | ICD-10-CM | POA: Diagnosis not present

## 2021-03-23 DIAGNOSIS — D631 Anemia in chronic kidney disease: Secondary | ICD-10-CM | POA: Diagnosis present

## 2021-03-23 DIAGNOSIS — J810 Acute pulmonary edema: Secondary | ICD-10-CM | POA: Diagnosis not present

## 2021-03-23 DIAGNOSIS — Z992 Dependence on renal dialysis: Secondary | ICD-10-CM | POA: Diagnosis not present

## 2021-03-23 DIAGNOSIS — G936 Cerebral edema: Secondary | ICD-10-CM | POA: Diagnosis not present

## 2021-03-23 DIAGNOSIS — E1100 Type 2 diabetes mellitus with hyperosmolarity without nonketotic hyperglycemic-hyperosmolar coma (NKHHC): Secondary | ICD-10-CM

## 2021-03-23 DIAGNOSIS — E1069 Type 1 diabetes mellitus with other specified complication: Secondary | ICD-10-CM | POA: Diagnosis present

## 2021-03-23 DIAGNOSIS — E1065 Type 1 diabetes mellitus with hyperglycemia: Secondary | ICD-10-CM | POA: Diagnosis present

## 2021-03-23 DIAGNOSIS — Z86718 Personal history of other venous thrombosis and embolism: Secondary | ICD-10-CM | POA: Diagnosis not present

## 2021-03-23 DIAGNOSIS — I10 Essential (primary) hypertension: Secondary | ICD-10-CM

## 2021-03-23 DIAGNOSIS — G931 Anoxic brain damage, not elsewhere classified: Secondary | ICD-10-CM | POA: Diagnosis not present

## 2021-03-23 DIAGNOSIS — R451 Restlessness and agitation: Secondary | ICD-10-CM | POA: Diagnosis not present

## 2021-03-23 DIAGNOSIS — E8721 Acute metabolic acidosis: Secondary | ICD-10-CM | POA: Diagnosis not present

## 2021-03-23 DIAGNOSIS — J9600 Acute respiratory failure, unspecified whether with hypoxia or hypercapnia: Secondary | ICD-10-CM | POA: Diagnosis not present

## 2021-03-23 DIAGNOSIS — Z978 Presence of other specified devices: Secondary | ICD-10-CM | POA: Diagnosis not present

## 2021-03-23 LAB — BLOOD GAS, ARTERIAL
Acid-Base Excess: 1.2 mmol/L (ref 0.0–2.0)
Bicarbonate: 26.1 mmol/L (ref 20.0–28.0)
Drawn by: 38235
FIO2: 100
O2 Saturation: 85.3 %
Patient temperature: 37.6
pCO2 arterial: 48.8 mmHg — ABNORMAL HIGH (ref 32.0–48.0)
pH, Arterial: 7.351 (ref 7.350–7.450)
pO2, Arterial: 59.5 mmHg — ABNORMAL LOW (ref 83.0–108.0)

## 2021-03-23 LAB — MAGNESIUM
Magnesium: 2.5 mg/dL — ABNORMAL HIGH (ref 1.7–2.4)
Magnesium: 2.2 mg/dL (ref 1.7–2.4)
Magnesium: 2.1 mg/dL (ref 1.7–2.4)
Magnesium: 2.1 mg/dL (ref 1.7–2.4)
Magnesium: 2.2 mg/dL (ref 1.7–2.4)
Magnesium: 2.1 mg/dL (ref 1.7–2.4)

## 2021-03-23 LAB — BASIC METABOLIC PANEL
Anion gap: 16 — ABNORMAL HIGH (ref 5–15)
Anion gap: 16 — ABNORMAL HIGH (ref 5–15)
Anion gap: 14 (ref 5–15)
Anion gap: 14 (ref 5–15)
Anion gap: 16 — ABNORMAL HIGH (ref 5–15)
Anion gap: 14 (ref 5–15)
BUN: 49 mg/dL — ABNORMAL HIGH (ref 6–20)
BUN: 50 mg/dL — ABNORMAL HIGH (ref 6–20)
BUN: 53 mg/dL — ABNORMAL HIGH (ref 6–20)
BUN: 52 mg/dL — ABNORMAL HIGH (ref 6–20)
BUN: 57 mg/dL — ABNORMAL HIGH (ref 6–20)
BUN: 58 mg/dL — ABNORMAL HIGH (ref 6–20)
CO2: 26 mmol/L (ref 22–32)
CO2: 24 mmol/L (ref 22–32)
CO2: 27 mmol/L (ref 22–32)
CO2: 26 mmol/L (ref 22–32)
CO2: 25 mmol/L (ref 22–32)
CO2: 25 mmol/L (ref 22–32)
Calcium: 8.8 mg/dL — ABNORMAL LOW (ref 8.9–10.3)
Calcium: 8.5 mg/dL — ABNORMAL LOW (ref 8.9–10.3)
Calcium: 8.5 mg/dL — ABNORMAL LOW (ref 8.9–10.3)
Calcium: 8.6 mg/dL — ABNORMAL LOW (ref 8.9–10.3)
Calcium: 8.7 mg/dL — ABNORMAL LOW (ref 8.9–10.3)
Calcium: 8.6 mg/dL — ABNORMAL LOW (ref 8.9–10.3)
Chloride: 88 mmol/L — ABNORMAL LOW (ref 98–111)
Chloride: 92 mmol/L — ABNORMAL LOW (ref 98–111)
Chloride: 94 mmol/L — ABNORMAL LOW (ref 98–111)
Chloride: 95 mmol/L — ABNORMAL LOW (ref 98–111)
Chloride: 93 mmol/L — ABNORMAL LOW (ref 98–111)
Chloride: 95 mmol/L — ABNORMAL LOW (ref 98–111)
Creatinine, Ser: 6.75 mg/dL — ABNORMAL HIGH (ref 0.44–1.00)
Creatinine, Ser: 6.86 mg/dL — ABNORMAL HIGH (ref 0.44–1.00)
Creatinine, Ser: 7.02 mg/dL — ABNORMAL HIGH (ref 0.44–1.00)
Creatinine, Ser: 7.15 mg/dL — ABNORMAL HIGH (ref 0.44–1.00)
Creatinine, Ser: 7.24 mg/dL — ABNORMAL HIGH (ref 0.44–1.00)
Creatinine, Ser: 7.35 mg/dL — ABNORMAL HIGH (ref 0.44–1.00)
GFR, Estimated: 7 mL/min — ABNORMAL LOW (ref >60)
GFR, Estimated: 7 mL/min — ABNORMAL LOW (ref >60)
GFR, Estimated: 7 mL/min — ABNORMAL LOW (ref >60)
GFR, Estimated: 7 mL/min — ABNORMAL LOW (ref >60)
GFR, Estimated: 6 mL/min — ABNORMAL LOW (ref >60)
GFR, Estimated: 6 mL/min — ABNORMAL LOW (ref >60)
Glucose, Bld: 516 mg/dL (ref 70–99)
Glucose, Bld: 186 mg/dL — ABNORMAL HIGH (ref 70–99)
Glucose, Bld: 123 mg/dL — ABNORMAL HIGH (ref 70–99)
Glucose, Bld: 100 mg/dL — ABNORMAL HIGH (ref 70–99)
Glucose, Bld: 123 mg/dL — ABNORMAL HIGH (ref 70–99)
Glucose, Bld: 99 mg/dL (ref 70–99)
Potassium: 4.7 mmol/L (ref 3.5–5.1)
Potassium: 4.8 mmol/L (ref 3.5–5.1)
Potassium: 5.2 mmol/L — ABNORMAL HIGH (ref 3.5–5.1)
Potassium: 5.1 mmol/L (ref 3.5–5.1)
Potassium: 4.7 mmol/L (ref 3.5–5.1)
Potassium: 4.4 mmol/L (ref 3.5–5.1)
Sodium: 130 mmol/L — ABNORMAL LOW (ref 135–145)
Sodium: 132 mmol/L — ABNORMAL LOW (ref 135–145)
Sodium: 135 mmol/L (ref 135–145)
Sodium: 135 mmol/L (ref 135–145)
Sodium: 134 mmol/L — ABNORMAL LOW (ref 135–145)
Sodium: 134 mmol/L — ABNORMAL LOW (ref 135–145)

## 2021-03-23 LAB — GLUCOSE, CAPILLARY
Glucose-Capillary: 101 mg/dL — ABNORMAL HIGH (ref 70–99)
Glucose-Capillary: 115 mg/dL — ABNORMAL HIGH (ref 70–99)
Glucose-Capillary: 117 mg/dL — ABNORMAL HIGH (ref 70–99)
Glucose-Capillary: 120 mg/dL — ABNORMAL HIGH (ref 70–99)
Glucose-Capillary: 123 mg/dL — ABNORMAL HIGH (ref 70–99)
Glucose-Capillary: 126 mg/dL — ABNORMAL HIGH (ref 70–99)
Glucose-Capillary: 126 mg/dL — ABNORMAL HIGH (ref 70–99)
Glucose-Capillary: 136 mg/dL — ABNORMAL HIGH (ref 70–99)
Glucose-Capillary: 141 mg/dL — ABNORMAL HIGH (ref 70–99)
Glucose-Capillary: 148 mg/dL — ABNORMAL HIGH (ref 70–99)
Glucose-Capillary: 153 mg/dL — ABNORMAL HIGH (ref 70–99)
Glucose-Capillary: 156 mg/dL — ABNORMAL HIGH (ref 70–99)
Glucose-Capillary: 168 mg/dL — ABNORMAL HIGH (ref 70–99)
Glucose-Capillary: 260 mg/dL — ABNORMAL HIGH (ref 70–99)
Glucose-Capillary: 344 mg/dL — ABNORMAL HIGH (ref 70–99)
Glucose-Capillary: 409 mg/dL — ABNORMAL HIGH (ref 70–99)
Glucose-Capillary: 446 mg/dL — ABNORMAL HIGH (ref 70–99)
Glucose-Capillary: 468 mg/dL — ABNORMAL HIGH (ref 70–99)
Glucose-Capillary: 564 mg/dL (ref 70–99)
Glucose-Capillary: 85 mg/dL (ref 70–99)
Glucose-Capillary: 86 mg/dL (ref 70–99)
Glucose-Capillary: 90 mg/dL (ref 70–99)
Glucose-Capillary: 97 mg/dL (ref 70–99)
Glucose-Capillary: 99 mg/dL (ref 70–99)

## 2021-03-23 LAB — HEPATITIS B SURFACE ANTIBODY,QUALITATIVE: Hep B S Ab: REACTIVE — AB

## 2021-03-23 LAB — PHOSPHORUS
Phosphorus: 7.5 mg/dL — ABNORMAL HIGH (ref 2.5–4.6)
Phosphorus: 7.2 mg/dL — ABNORMAL HIGH (ref 2.5–4.6)
Phosphorus: 6.9 mg/dL — ABNORMAL HIGH (ref 2.5–4.6)
Phosphorus: 6.9 mg/dL — ABNORMAL HIGH (ref 2.5–4.6)
Phosphorus: 7.2 mg/dL — ABNORMAL HIGH (ref 2.5–4.6)
Phosphorus: 7.4 mg/dL — ABNORMAL HIGH (ref 2.5–4.6)

## 2021-03-23 LAB — CBC
HCT: 35 % — ABNORMAL LOW (ref 36.0–46.0)
Hemoglobin: 10.8 g/dL — ABNORMAL LOW (ref 12.0–15.0)
MCH: 24.3 pg — ABNORMAL LOW (ref 26.0–34.0)
MCHC: 30.9 g/dL (ref 30.0–36.0)
MCV: 78.8 fL — ABNORMAL LOW (ref 80.0–100.0)
Platelets: 273 10*3/uL (ref 150–400)
RBC: 4.44 MIL/uL (ref 3.87–5.11)
RDW: 17.7 % — ABNORMAL HIGH (ref 11.5–15.5)
WBC: 9.9 10*3/uL (ref 4.0–10.5)
nRBC: 0 % (ref 0.0–0.2)

## 2021-03-23 LAB — HEMOGLOBIN A1C
Hgb A1c MFr Bld: 7.8 % — ABNORMAL HIGH (ref 4.8–5.6)
Mean Plasma Glucose: 177.16 mg/dL

## 2021-03-23 LAB — MRSA NEXT GEN BY PCR, NASAL: MRSA by PCR Next Gen: NOT DETECTED

## 2021-03-23 LAB — HEPATITIS B SURFACE ANTIGEN: Hepatitis B Surface Ag: NONREACTIVE

## 2021-03-23 MED ORDER — MIDAZOLAM HCL 2 MG/2ML IJ SOLN
2.0000 mg | INTRAMUSCULAR | Status: DC | PRN
  Administered 2021-03-23: 02:00:00 2 mg via INTRAVENOUS
  Filled 2021-03-23 (×2): qty 2

## 2021-03-23 MED ORDER — FENTANYL CITRATE (PF) 100 MCG/2ML IJ SOLN
50.0000 ug | Freq: Once | INTRAMUSCULAR | Status: AC
  Administered 2021-03-23: 03:00:00 50 ug via INTRAVENOUS

## 2021-03-23 MED ORDER — INSULIN GLARGINE-YFGN 100 UNIT/ML ~~LOC~~ SOLN
5.0000 [IU] | Freq: Two times a day (BID) | SUBCUTANEOUS | Status: DC
  Administered 2021-03-23 – 2021-03-24 (×4): 5 [IU] via SUBCUTANEOUS
  Filled 2021-03-23 (×6): qty 0.05

## 2021-03-23 MED ORDER — FENTANYL 2500MCG IN NS 250ML (10MCG/ML) PREMIX INFUSION
50.0000 ug/h | INTRAVENOUS | Status: DC
  Administered 2021-03-23: 03:00:00 50 ug/h via INTRAVENOUS
  Administered 2021-03-23: 15:00:00 175 ug/h via INTRAVENOUS
  Administered 2021-03-24: 11:00:00 75 ug/h via INTRAVENOUS
  Administered 2021-03-25 (×2): 150 ug/h via INTRAVENOUS
  Filled 2021-03-23 (×5): qty 250

## 2021-03-23 MED ORDER — HEPARIN SODIUM (PORCINE) 5000 UNIT/ML IJ SOLN
5000.0000 [IU] | Freq: Three times a day (TID) | INTRAMUSCULAR | Status: DC
  Administered 2021-03-23 – 2021-03-31 (×24): 5000 [IU] via SUBCUTANEOUS
  Filled 2021-03-23 (×25): qty 1

## 2021-03-23 MED ORDER — ORAL CARE MOUTH RINSE
15.0000 mL | OROMUCOSAL | Status: DC
  Administered 2021-03-23 – 2021-03-31 (×84): 15 mL via OROMUCOSAL

## 2021-03-23 MED ORDER — DOCUSATE SODIUM 100 MG PO CAPS: 100.0000 mg | ORAL_CAPSULE | Freq: Two times a day (BID) | ORAL | Status: DC | PRN

## 2021-03-23 MED ORDER — VITAL HIGH PROTEIN PO LIQD
1000.0000 mL | ORAL | Status: DC
  Administered 2021-03-23: 10:00:00 1000 mL

## 2021-03-23 MED ORDER — POLYETHYLENE GLYCOL 3350 17 G PO PACK: 17.0000 g | PACK | Freq: Every day | ORAL | Status: DC | PRN

## 2021-03-23 MED ORDER — ASPIRIN 325 MG PO TABS
162.0000 mg | ORAL_TABLET | Freq: Every day | ORAL | Status: DC
Start: 2021-03-23 — End: 2021-03-25
  Administered 2021-03-23 – 2021-03-24 (×2): 162 mg
  Filled 2021-03-23 (×2): qty 1

## 2021-03-23 MED ORDER — LACTATED RINGERS IV SOLN: INTRAVENOUS | Status: DC

## 2021-03-23 MED ORDER — CHLORHEXIDINE GLUCONATE CLOTH 2 % EX PADS
6.0000 | MEDICATED_PAD | Freq: Every day | CUTANEOUS | Status: DC
  Administered 2021-03-24 – 2021-03-31 (×8): 6 via TOPICAL

## 2021-03-23 MED ORDER — DOCUSATE SODIUM 50 MG/5ML PO LIQD
100.0000 mg | Freq: Two times a day (BID) | ORAL | Status: DC
  Administered 2021-03-23 – 2021-03-24 (×4): 100 mg
  Filled 2021-03-23 (×4): qty 10

## 2021-03-23 MED ORDER — POLYETHYLENE GLYCOL 3350 17 G PO PACK
17.0000 g | PACK | Freq: Every day | ORAL | Status: DC
  Administered 2021-03-23 – 2021-03-24 (×2): 17 g
  Filled 2021-03-23 (×2): qty 1

## 2021-03-23 MED ORDER — CHLORHEXIDINE GLUCONATE 0.12% ORAL RINSE (MEDLINE KIT)
15.0000 mL | Freq: Two times a day (BID) | OROMUCOSAL | Status: DC
  Administered 2021-03-23 – 2021-03-31 (×18): 15 mL via OROMUCOSAL

## 2021-03-23 MED ORDER — LIDOCAINE HCL (PF) 1 % IJ SOLN: 5.0000 mL | INTRAMUSCULAR | Status: DC | PRN

## 2021-03-23 MED ORDER — FOLIC ACID 1 MG PO TABS
1.0000 mg | ORAL_TABLET | Freq: Every day | ORAL | Status: DC
  Administered 2021-03-23 – 2021-03-24 (×2): 1 mg
  Filled 2021-03-23 (×2): qty 1

## 2021-03-23 MED ORDER — FENTANYL BOLUS VIA INFUSION
30.0000 ug | INTRAVENOUS | Status: DC | PRN
  Administered 2021-03-24 – 2021-03-25 (×2): 30 ug via INTRAVENOUS
  Filled 2021-03-23: qty 30

## 2021-03-23 MED ORDER — VITAL HIGH PROTEIN PO LIQD
1000.0000 mL | ORAL | Status: DC
  Administered 2021-03-24 – 2021-03-29 (×5): 1000 mL
  Filled 2021-03-23: qty 1000

## 2021-03-23 MED ORDER — PRAVASTATIN SODIUM 40 MG PO TABS
40.0000 mg | ORAL_TABLET | Freq: Every day | ORAL | Status: DC
  Administered 2021-03-23 – 2021-03-24 (×2): 40 mg
  Filled 2021-03-23 (×2): qty 1

## 2021-03-23 MED ORDER — DARBEPOETIN ALFA 40 MCG/0.4ML IJ SOSY
40.0000 ug | PREFILLED_SYRINGE | INTRAMUSCULAR | Status: DC
  Administered 2021-03-23: 13:00:00 40 ug via INTRAVENOUS
  Filled 2021-03-23 (×2): qty 0.4

## 2021-03-23 MED ORDER — HEPARIN SODIUM (PORCINE) 1000 UNIT/ML DIALYSIS: 1000.0000 [IU] | INTRAMUSCULAR | Status: DC | PRN

## 2021-03-23 MED ORDER — SODIUM CHLORIDE 0.9 % IV SOLN: 100.0000 mL | INTRAVENOUS | Status: DC | PRN

## 2021-03-23 MED ORDER — LIDOCAINE-PRILOCAINE 2.5-2.5 % EX CREA
1.0000 "application " | TOPICAL_CREAM | CUTANEOUS | Status: DC | PRN
  Filled 2021-03-23: qty 5

## 2021-03-23 MED ORDER — ALTEPLASE 2 MG IJ SOLR
2.0000 mg | Freq: Once | INTRAMUSCULAR | Status: DC | PRN
  Filled 2021-03-23: qty 2

## 2021-03-23 MED ORDER — COLLAGENASE 250 UNIT/GM EX OINT
TOPICAL_OINTMENT | Freq: Every day | CUTANEOUS | Status: DC
  Filled 2021-03-23: qty 30

## 2021-03-23 MED ORDER — AMLODIPINE BESYLATE 5 MG PO TABS: 5.0000 mg | ORAL_TABLET | Freq: Every day | ORAL | Status: DC

## 2021-03-23 MED ORDER — PROSOURCE TF PO LIQD
45.0000 mL | Freq: Two times a day (BID) | ORAL | Status: DC
  Administered 2021-03-23: 10:00:00 45 mL
  Filled 2021-03-23: qty 45

## 2021-03-23 MED ORDER — PANTOPRAZOLE SODIUM 40 MG IV SOLR
40.0000 mg | INTRAVENOUS | Status: DC
  Administered 2021-03-23 – 2021-03-25 (×3): 40 mg via INTRAVENOUS
  Filled 2021-03-23 (×3): qty 40

## 2021-03-23 MED ORDER — CHLORHEXIDINE GLUCONATE CLOTH 2 % EX PADS
6.0000 | MEDICATED_PAD | Freq: Every day | CUTANEOUS | Status: DC
  Administered 2021-03-23: 01:00:00 6 via TOPICAL

## 2021-03-23 MED ORDER — PENTAFLUOROPROP-TETRAFLUOROETH EX AERO: 1.0000 "application " | INHALATION_SPRAY | CUTANEOUS | Status: DC | PRN

## 2021-03-23 MED ORDER — HYDRALAZINE HCL 20 MG/ML IJ SOLN
10.0000 mg | INTRAMUSCULAR | Status: DC | PRN
  Administered 2021-03-23: 01:00:00 10 mg via INTRAVENOUS
  Filled 2021-03-23 (×2): qty 1

## 2021-03-23 NOTE — Progress Notes (Signed)
eLink Physician-Brief Progress Note Patient Name: [name] DOB: Jun 24, 1970 MRN: [id number]   Date of Service  03/23/2021  HPI/Events of Note  [name] is a 50 y.o. female.  Presented to the emergency room with concern for hyperglycemia, altered mental status.  EMS report that there was concern insulin was not being administered correctly, patient also had complained of nausea and vomiting.  Combative in route.  Glucose greater than 600. Patient intubated in ED d/t deteriorating mental status. Arrives in ICU intubated, ventilated and sedated on a Propofol IV infusion. BP = 219/101. PCCM ground team has not seen patient yet d/t acute medical emergency with another patient.  eICU Interventions  Plan: Hydralazine per my previous note. PCCM ground team notified of patient's arrival in ICU.      Intervention Category Evaluation Type: New Patient Evaluation  Lysle Dingwall 03/23/2021, 1:26 AM

## 2021-03-23 NOTE — Progress Notes (Signed)
RT note: RT and RN transported vent patient to CT and then to 74M bed 2. Vital signs stable through out.

## 2021-03-23 NOTE — Progress Notes (Signed)
Initial Nutrition Assessment  DOCUMENTATION CODES:   Not applicable  INTERVENTION:   Initiate tube feeding via OG tube: Vital HP at 45 ml/h (1080 ml per day)  Provides 1080 kcal (1809 kcal total with propofol), 94 gm protein, 903 ml free water daily  Multivitamin with minerals daily via tube.  NUTRITION DIAGNOSIS:   Inadequate oral intake related to inability to eat as evidenced by NPO status.  GOAL:   Patient will meet greater than or equal to 90% of their needs  MONITOR:   Vent status, Labs, TF tolerance  REASON FOR ASSESSMENT:   Ventilator, Consult Enteral/tube feeding initiation and management  ASSESSMENT:   50 yo female admitted with AMS, hyperosmolar hyperglycemia syndrome, glucose > 900 on admission. PMH includes type 1 DM on insulin pump, ESRD on HD, HTN, HLD, PVD, R TMA, L toe amputation.  Discussed patient in ICU rounds and with RN today. Patient is sedated, but agitated this morning. IVF being stopped and free water being added. Remains on insulin drip. OG tube in place, tube over the stomach, tip out of view on chest x-ray.  Patient is currently intubated on ventilator support MV: 8.3 L/min Temp (24hrs), Avg:99.3 F (37.4 C), Min:97.4 F (36.3 C), Max:101 F (38.3 C)  Propofol: 27.6 ml/hr providing 729 kcal from lipid  Labs reviewed. K 5.2, phos 6.9, A1C 7.8 CBG: [phone]  Medications reviewed and include Aranesp, Colace, folic acid, Semglee, Protonix, Miralax, propofol, IV insulin.  Weight history reviewed. No overall significant weight changes noted over the past 6 months.   NUTRITION - FOCUSED PHYSICAL EXAM:  Flowsheet Row Most Recent Value  Orbital Region No depletion  Upper Arm Region No depletion  Thoracic and Lumbar Region No depletion  Buccal Region No depletion  Temple Region No depletion  Clavicle Bone Region No depletion  Clavicle and Acromion Bone Region No depletion  Scapular Bone Region Unable to assess  Dorsal  Hand No depletion  Patellar Region Mild depletion  Anterior Thigh Region Mild depletion  Posterior Calf Region Unable to assess  Edema (RD Assessment) Mild  Hair Reviewed  Eyes Unable to assess  Mouth Unable to assess  Skin Reviewed  Nails Reviewed       Diet Order:   Diet Order             Diet NPO time specified  Diet effective now                   EDUCATION NEEDS:   Not appropriate for education at this time  Skin:  Skin Assessment: Skin Integrity Issues: Skin Integrity Issues:: Diabetic Ulcer Diabetic Ulcer: L pretibial, R foot  Last BM:  no BM documented  Height:   Ht Readings from Last 1 Encounters:  03/23/21 5\' 6"  (1.676 m)    Weight:   Wt Readings from Last 1 Encounters:  03/23/21 60.5 kg     BMI:  Body mass index is 21.53 kg/m.  Estimated Nutritional Needs:   Kcal:  1600-1800  Protein:  90-100 gm  Fluid:  >/= 1.8 L    Lucas Mallow, RD, LDN, CNSC Please refer to Amion for contact information.

## 2021-03-23 NOTE — ED Provider Notes (Signed)
I received the patient in signout from Dr. Roslynn Amble, briefly the patient is a 50 year old female who came in altered and hypoglycemic.  Likely in HHS based on lab evaluation.  Awaiting admission.  Hospitalist concerned that the mental status has gotten worse.  ICU consulted.  Recommending ICU admit.  Recommending intubation.  Patient intubated.  CT of the head.  ICU admission.  CRITICAL CARE Performed by: Cecilio Asper   Total critical care time: 35 minutes  Critical care time was exclusive of separately billable procedures and treating other patients.  Critical care was necessary to treat or prevent imminent or life-threatening deterioration.  Critical care was time spent personally by me on the following activities: development of treatment plan with patient and/or surrogate as well as nursing, discussions with consultants, evaluation of patient's response to treatment, examination of patient, obtaining history from patient or surrogate, ordering and performing treatments and interventions, ordering and review of laboratory studies, ordering and review of radiographic studies, pulse oximetry and re-evaluation of patient's condition.  EJ placement: 18 gauge IV placed in R EJ. Skin prepped with alcohol pads, R EJ identified with Valsalva. Cannulated with good return of dark, non-pulsatile blood. Tachyderm placed after easily flushed with NS.   OG placement  Date/Time: 03/23/2021 12:10 AM Performed by: Deno Etienne, DO Authorized by: Deno Etienne, DO  Consent: Verbal consent not obtained. Consent given by: patient Patient identity confirmed: arm band Time out: Immediately prior to procedure a "time out" was called to verify the correct patient, procedure, equipment, support staff and site/side marked as required. Local anesthesia used: no  Anesthesia: Local anesthesia used: no  Sedation: Patient sedated: yes Sedatives: etomidate  Patient tolerance: patient tolerated the procedure  well with no immediate complications      Deno Etienne, DO 03/23/21 0011

## 2021-03-23 NOTE — Progress Notes (Signed)
ABG obtained was mixed venous. Patient is hard to stick due to restricted access on left arm and other lines already in place on her right arm. RN made aware. O2 sat is coordinating well with heart rate and reading 100%. All other aspects of ABG results are normal. Turned fio2 down to 80%; kept other settings as they are.

## 2021-03-23 NOTE — Progress Notes (Signed)
Pt receives out-pt HD at [facility] SW on TTS. Pt arrives at 6:30 for 6:45 chair time. Will assist as needed.  Melven Sartorius Renal Navigator [phone]

## 2021-03-23 NOTE — Progress Notes (Signed)
[facility] Progress Note Patient Name: BANNIE LOBBAN DOB: 10/26/1970 MRN: [id number]   Date of Service  03/23/2021  HPI/Events of Note  Hypertension - BP = 219/101.   eICU Interventions  Plan: Hydralazine 10 mg IV Q 4 hours PRN SBP > 170 or DBP > 100.     Intervention Category Major Interventions: Hypertension - evaluation and management  Odas Ozer Eugene 03/23/2021, 1:19 AM

## 2021-03-23 NOTE — Progress Notes (Addendum)
[facility] Progress Note Patient Name: [name] DOB: [birth year] MRN: [id number]   Date of Service  03/23/2021  HPI/Events of Note  Agitation -  Patient is biting on ETT and kicking her legs. Currenlty on ceiling doses of Fentanyl and Propofol IV infusions.  eICU Interventions  Plan: Increase ceiling on Fentanyl IV infusion to 300 mcg/hour. Fentanyl 30 mcg bolus from infusion Q 2 hours PRN agitation or sedation.     Intervention Category Major Interventions: Delirium, psychosis, severe agitation - evaluation and management  Kaprice Kage Eugene 03/23/2021, 3:14 AM

## 2021-03-23 NOTE — Consult Note (Addendum)
[facility] Nurse Consult Note: Reason for Consult: Consult requested for BLE.  Pt is familiar to [facility] team from previous admission on 8/22.  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  Wound type: Left anterior calf with chronic full thickness wound; 1X1cm, 100% yellow slough, tightly adhered, small amt tan drainage. Right inner ankle area with small pink moist partial thickness wounds over previous surgical incision location; 1X1X.1cm and .5X.5X.1cm Heels with intact skin Dressing procedure/placement/frequency: Topical treatment orders provided for bedside nurses to perform as follows:  Float heels to reduce pressure. 1. Apply Santyl to left anterior calf wound Q day, then cover with moist gauze and foam dressing.  (Change foam dressing Q 3 days or PRN soiling.) 2. Foam dressing to bilat heels and right inner ankle.  Change Q 3 days or PRN soiling. Please re-consult if further assistance is needed.  Thank-you,  Julien Girt MSN, [place], [place], [place], [place]

## 2021-03-23 NOTE — Progress Notes (Signed)
Inpatient Diabetes Program Recommendations  AACE/ADA: New Consensus Statement on Inpatient Glycemic Control (2015)  Target Ranges:  Prepandial:   less than 140 mg/dL      Peak postprandial:   less than 180 mg/dL (1-2 hours)      Critically ill patients:  140 - 180 mg/dL   Lab Results  Component Value Date   GLUCAP 115 (H) 03/23/2021   HGBA1C 7.8 (H) 03/23/2021    Review of Glycemic Control  Latest Reference Range & Units 03/23/21 07:06 03/23/21 08:02 03/23/21 09:05  Glucose-Capillary 70 - 99 mg/dL 153 (H) 126 (H) 115 (H)  (H): Data is abnormally high Diabetes history: Type 1 DM Outpatient Diabetes medications: Novolog per insulin pump Current orders for Inpatient glycemic control: IV insulin to transition to Semglee 5 units BID  Inpatient Diabetes Program Recommendations:    Noted plan for transition.   Consider also adding Novolog 2 units Q4H for tube feed coverage (to be stopped or paused in the event tube feeds are stopped) and Novolog 1-3 units Q4H.   Outpatient insulin pump settings per Dr Dwyane Dee:  Basal insulin  0000-0300 0.4 units/hour 0301-0800 0.3 units/hour 0801-1500 0.375 units/hour 1501-0000 0.45 units/hour Total daily basal insulin: 9.375 units/24 hours  Carb Coverage 1:20 1 unit for every 20 grams of carbohydrates  Insulin Sensitivity 1:60 1 unit drops blood glucose 60 mg/dl  Target Glucose Goals 0000-0000 150 mg/dl  Will follow.   Thanks, Bronson Curb, MSN, RNC-OB Diabetes Coordinator [phone] (8a-5p)

## 2021-03-23 NOTE — Consult Note (Signed)
[facility] Renal Consultation Note    Indication for Consultation:  Management of ESRD/hemodialysis; anemia, hypertension/volume and secondary hyperparathyroidism  HPI: [name] is a 50 y.o. female with a PMH significant for DM type 1, HTN, HLD, PAD s/p left TMA, h/o DVT, OSA, and ESRD on HD every TTS at [place] [facility] who was found with AMS by her roommate and EMS called and brought to [facility] ED.  In the ED her glucose was > 900 and started on an insulin drip, however her mental status worsened and was intubated for airway protection and admitted to the ICU.  We were consulted to provide HD during her hospitalization.  She had a similar presentation in August 2022.  She is currently intubated and sedated and HPI was obtained through review of her EMR.  Past Medical History:  Diagnosis Date   Anemia    Arthritis    Bronchitis    C. 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[facility])    Peripheral vascular disease (HCC)    Sleep apnea    does not use cpap   Staphylococcus aureus bacteremia 10/2014   Type 1 diabetes [facility])    onset age 71   Past Surgical History:  Procedure Laterality Date   AMPUTATION TOE Left 07/24/2015   5th toe and Hallux amputation   AV FISTULA PLACEMENT Left 08/06/2018   Procedure: INSERTION OF BASCILIC VEN TRANSPOSITION 1ST STAGE;  Surgeon: Rosetta Posner, MD;  [facility];  Service: Vascular;  Laterality: Left;   [place] Left 04/29/2019   Procedure: SECOND STAGE BASCILIC VEIN TRANSPOSITION LEFT ARM;  Surgeon: Rosetta Posner, MD;  [facility]: [facility];  Service: Vascular;  Laterality: Left;   CESAREAN SECTION     x 1   EXOSTECTECTOMY TOE Left 04/11/2016   Procedure: EXOSTECTECTOMY CUBOID AND 5TH METATARSAL AND PLACEMENT OF ANTIBIOTIC BEADS;  Surgeon: Edrick Kins, DPM;  Location: [place];  Service: Podiatry;  Laterality: Left;   EYE SURGERY Bilateral    removed cataracts - laser   FEMORAL-POPLITEAL BYPASS GRAFT Left 05/03/2015   FOOT AMPUTATION THROUGH METATARSAL Right 2016   hemrrhoidectomy     I & D EXTREMITY Right 06/10/2014   Procedure: IRRIGATION AND DEBRIDEMENT Right Foot;  Surgeon: Newt Minion, MD;  Location: [place];  Service: Orthopedics;  Laterality: Right;   I & D EXTREMITY Right 07/31/2018   Procedure: IRRIGATION AND DEBRIDEMENT EXTREMITY;  Surgeon: Edrick Kins, DPM;  Location: [place];  Service: Podiatry;  Laterality: Right;   I & D EXTREMITY Right 08/04/2018   Procedure: IRRIGATION AND DEBRIDEMENT FOOT with placement of wound vac;  Surgeon: Edrick Kins, DPM;  Location: [place];  Service: Podiatry;  Laterality: Right;   INCISION AND DRAINAGE Left 04/11/2016   Procedure: INCISION AND DRAINAGE A DEEP  COMPLICATED WOUND OF LEFT FOOT;  Surgeon: Edrick Kins, DPM;  Location: [place];  Service: Podiatry;  Laterality: Left;   INSERTION OF DIALYSIS CATHETER Right 08/06/2018   Procedure: INSERTION OF TUNNELED DIALYSIS CATHETER;  Surgeon: Rosetta Posner, MD;  [facility];  Service: Vascular;  Laterality: Right;   IR FLUORO GUIDE CV LINE RIGHT  09/24/2017   IR FLUORO GUIDE CV LINE RIGHT  08/01/2018   IR REMOVAL TUN CV CATH W/O FL  11/22/2017   IR US GUIDE VASC ACCESS RIGHT  09/24/2017   IR US GUIDE VASC ACCESS RIGHT  08/01/2018   IRRIGATION AND DEBRIDEMENT ABSCESS Left 05/06/2017   Procedure: IRRIGATION AND DEBRIDEMENT ABSCESS LEFT FOOT;  Surgeon: Edrick Kins, DPM;  Location: [facility];  Service: Podiatry;   Laterality: Left;   PERIPHERAL VASCULAR BALLOON ANGIOPLASTY Left 01/20/2020   Procedure: PERIPHERAL VASCULAR BALLOON ANGIOPLASTY;  Surgeon: Marty Heck, MD;  Location: [place] [facility];  Service: Cardiovascular;  Laterality: Left;  Arm fistula   PERIPHERALLY INSERTED CENTRAL CATHETER INSERTION     SKIN GRAFT Right 06/15/2014   SKIN GRAFT Left 06/2015   foot    SKIN SPLIT GRAFT Right 06/15/2014   Procedure: SPLIT THICKNESS SKIN GRAFT RIGHT FOOT;  Surgeon: Newt Minion, MD;  Location: [place];  Service: Orthopedics;  Laterality: Right;   TRANSMETATARSAL AMPUTATION Right    TRANSMETATARSAL AMPUTATION Right 09/11/2014   WISDOM TOOTH EXTRACTION     Family History:   Family History  Problem Relation Age of Onset   Hyperlipidemia Mother    Dementia Mother    Social History:  reports that she quit smoking about 12 years ago. Her smoking use included cigarettes. She has never used smokeless tobacco. She reports that she does not currently use alcohol. She reports that she does not use drugs. Allergies  Allergen Reactions   Cleocin [Clindamycin Hcl] Diarrhea   Lisinopril Other (See Comments)    Elevated potassium per pt report   Amoxicillin Diarrhea, Nausea Only and Other (See Comments)    Did it involve swelling of the face/tongue/throat, SOB, or low BP? No Did it involve sudden or severe rash/hives, skin peeling, or any reaction on the inside of your mouth or nose? No Did you need to seek medical attention at a hospital or doctor's office? No When did it last happen?      10 + years If all above answers are "NO", may proceed with cephalosporin use.     Bactrim [Sulfamethoxazole-Trimethoprim] Diarrhea and Nausea Only   Prior to Admission medications   Medication Sig Start Date End Date Taking? Authorizing Provider  albuterol (PROVENTIL HFA;VENTOLIN HFA) 108 (90 BASE) MCG/ACT inhaler Inhale 2 puffs into the lungs every 6 (six) hours as needed for wheezing or shortness of breath.      [provider]  amLODipine (NORVASC) 5 MG tablet Take 5 mg by mouth daily. 11/07/20   [provider]  aspirin EC 81 MG tablet Take 81 mg by mouth daily.    [provider]  B Complex-C-Zn-Folic Acid (DIALYVITE 031 WITH ZINC) 0.8 MG TABS Take 1 tablet by mouth daily. 03/09/19   [provider]  calcitRIOL (ROCALTROL) 0.5 MCG capsule Take 1 capsule daily 08/13/17   Elayne Snare, MD  calcium acetate (PHOSLO) 667 MG capsule Take 2 capsules (1,334 mg total) by mouth 3 (three) times daily with meals. 08/08/18   Danford, Suann Larry, MD  Continuous Blood Gluc Receiver ([place]) DEVI 1  each by Does not apply route See admin instructions. Use Dexcom Receiver to monitor blood sugar continuously. 03/21/21   Elayne Snare, MD  Continuous Blood Gluc Sensor (DEXCOM G6 SENSOR) MISC 1 each by Does not apply route See admin instructions. Use one sensors once every 10 days to monitor blood sugars. 03/21/21   Elayne Snare, MD  Continuous Blood Gluc Transmit (DEXCOM G6 TRANSMITTER) MISC 1 each by Does not apply route See admin instructions. Use one transmitter once every 90 days. 03/21/21   Elayne Snare, MD  CONTOUR NEXT TEST test strip USE AS INSTRUCTED TO CHECK BLOOD SUGAR 4 TIMES A DAY. DX E10.65 06/08/19   Elayne Snare, MD  Cyanocobalamin (B-12) 5000 MCG CAPS Take 5,000 mcg by mouth daily.    [provider]  diphenhydrAMINE (BENADRYL) 25 MG tablet Take 25 mg by mouth daily as needed.     [provider]  folic acid (FOLVITE) 389 MCG tablet Take 400 mcg by mouth daily.    [provider]  insulin aspart (NOVOLOG) 100 UNIT/ML injection USE A MAX OF 65 UNITS DAILY VIA INSULIN PUMP 06/24/20   Elayne Snare, MD  Insulin Human (INSULIN PUMP) SOLN Inject into the skin. Insulin aspart (Novolog) 100 unit/ml---MAX 65 UNITS DAILY    [provider]  Insulin Lispro-aabc (LYUMJEV KWIKPEN [place]) Inject into the skin.    [provider]   lidocaine-prilocaine (EMLA) cream Apply 1 application topically daily as needed. Port access 08/16/20   [provider]  norethindrone (AYGESTIN) 5 MG tablet Take 5 mg by mouth See admin instructions. 5mg  daily for 14 days, then off for 14 days, then repeat cycle. Patient not taking: Reported on 01/13/2021 02/17/19   [provider]  NOVOLOG 100 UNIT/ML injection USE a max OF 65 UNITS EVERY DAY via insulin pump 03/20/21   Elayne Snare, MD  NUZYRA 150 MG TABS Take by mouth. 03/08/21   [provider]  Omega-3 Fatty Acids (OMEGA 3 500) 500 MG CAPS Take 500 mg by mouth daily.    [provider]  ondansetron (ZOFRAN ODT) 4 MG disintegrating tablet Take 1 tablet (4 mg total) by mouth every 8 (eight) hours as needed for nausea or vomiting. 10/08/20   Luna Fuse, MD  pravastatin (PRAVACHOL) 40 MG tablet Take 1 tablet (40 mg total) by mouth daily. 11/07/20   Elayne Snare, MD   Current Facility-Administered Medications  Medication Dose Route Frequency Provider Last Rate Last Admin   0.9 %  sodium chloride infusion  100 mL Intravenous PRN Donato Heinz, MD       0.9 %  sodium chloride infusion  100 mL Intravenous PRN Donato Heinz, MD       alteplase (CATHFLO ACTIVASE) injection 2 mg  2 mg Intracatheter Once PRN Donato Heinz, MD       amLODipine (NORVASC) tablet 5 mg  5 mg Per Tube Daily Lafayette Dragon, MD       aspirin tablet 162 mg  162 mg Per Tube Daily Lafayette Dragon, MD   162 mg at 03/23/21 1007   chlorhexidine gluconate (MEDLINE KIT) (PERIDEX) 0.12 % solution 15 mL  15 mL Mouth Rinse BID Lafayette Dragon, MD   15 mL at 03/23/21 0759   Chlorhexidine Gluconate Cloth 2 % PADS 6 each  6 each Topical Q0600 Lafayette Dragon, MD   6 each at 03/23/21 0057   Chlorhexidine Gluconate Cloth 2 % PADS 6 each  6 each Topical Q0600 Donato Heinz,  MD       collagenase (SANTYL) ointment   Topical Daily Spero Geralds, MD       Darbepoetin Alfa (ARANESP)  injection 40 mcg  40 mcg Intravenous Q Thu-HD Donato Heinz, MD       dextrose 5 % in lactated ringers infusion   Intravenous Continuous Spero Geralds, MD 125 mL/hr at 03/23/21 1000 Infusion Verify at 03/23/21 1000   dextrose 50 % solution 0-50 mL  0-50 mL Intravenous PRN Lucrezia Starch, MD       docusate (COLACE) 50 MG/5ML liquid 100 mg  100 mg Per Tube BID Lafayette Dragon, MD   100 mg at 03/23/21 1007   docusate sodium (COLACE) capsule 100 mg  100 mg Oral BID PRN Lafayette Dragon, MD       etomidate (AMIDATE) injection   Intravenous PRN Deno Etienne, DO   20 mg at 03/20/2021 2349   feeding supplement (PROSource TF) liquid 45 mL  45 mL Per Tube BID Spero Geralds, MD   45 mL at 03/23/21 1006   feeding supplement (VITAL HIGH PROTEIN) liquid 1,000 mL  1,000 mL Per Tube Q24H Spero Geralds, MD   1,000 mL at 03/23/21 1015   fentaNYL (SUBLIMAZE) bolus via infusion 30 mcg  30 mcg Intravenous Q1H PRN Anders Simmonds, MD       fentaNYL 2536mcg in NS 240mL (42mcg/ml) infusion-PREMIX  50-300 mcg/hr Intravenous Continuous Anders Simmonds, MD 17.5 mL/hr at 03/23/21 1000 175 mcg/hr at 33/43/56 8616   folic acid (FOLVITE) tablet 1 mg  1 mg Per Tube Daily Lafayette Dragon, MD   1 mg at 03/23/21 1007   heparin injection 1,000 Units  1,000 Units Dialysis PRN Donato Heinz, MD       heparin injection 5,000 Units  5,000 Units Subcutaneous Q8H Lafayette Dragon, MD   5,000 Units at 03/23/21 8372   hydrALAZINE (APRESOLINE) injection 10 mg  10 mg Intravenous Q4H PRN Anders Simmonds, MD   10 mg at 03/23/21 0126   insulin glargine-yfgn (SEMGLEE) injection 5 Units  5 Units Subcutaneous BID Spero Geralds, MD   5 Units at 03/23/21 1044   insulin regular, human (MYXREDLIN) 100 units/ 100 mL infusion   Intravenous Continuous Lucrezia Starch, MD 0.4 mL/hr at 03/23/21 1000 0.4 Units/hr at 03/23/21 1000   lactated ringers infusion   Intravenous Continuous Spero Geralds, MD       lidocaine (PF)  (XYLOCAINE) 1 % injection 5 mL  5 mL Intradermal PRN Donato Heinz, MD       lidocaine-prilocaine (EMLA) cream 1 application  1 application Topical PRN Donato Heinz, MD       LORazepam (ATIVAN) injection 0.5 mg  0.5 mg Intravenous Once PRN Deno Etienne, DO       MEDLINE mouth rinse  15 mL Mouth Rinse 10 times per day Lafayette Dragon, MD   15 mL at 03/23/21 1022   midazolam (VERSED) injection 2 mg  2 mg Intravenous Q2H PRN Anders Simmonds, MD   2 mg at 03/23/21 9021   pantoprazole (PROTONIX) injection 40 mg  40 mg Intravenous Q24H Lafayette Dragon, MD   40 mg at 03/23/21 1155   pentafluoroprop-tetrafluoroeth (GEBAUERS) aerosol 1 application  1 application Topical PRN Donato Heinz, MD       polyethylene glycol (MIRALAX / GLYCOLAX) packet 17 g  17 g Oral Daily PRN Lafayette Dragon, MD  polyethylene glycol (MIRALAX / GLYCOLAX) packet 17 g  17 g Per Tube Daily Lafayette Dragon, MD   17 g at 03/23/21 1006   pravastatin (PRAVACHOL) tablet 40 mg  40 mg Per Tube q1800 Lafayette Dragon, MD       propofol (DIPRIVAN) 1000 MG/100ML infusion  5-80 mcg/kg/min Intravenous Continuous Deno Etienne, DO 27.6 mL/hr at 03/23/21 1000 80 mcg/kg/min at 03/23/21 1000   rocuronium (ZEMURON) injection   Intravenous PRN Deno Etienne, DO   75 mg at 03/13/2021 2350   Labs: Basic Metabolic Panel: Recent Labs  Lab 03/23/21 0128 03/23/21 0444 03/23/21 0923  NA 130* 132* 135  K 4.7 4.8 5.2*  CL 88* 92* 94*  CO2 $Re'26 24 27  'Myt$ GLUCOSE 516* 186* 123*  BUN 49* 50* 53*  CREATININE 6.75* 6.86* 7.02*  CALCIUM 8.8* 8.5* 8.5*  PHOS 7.5* 7.2* 6.9*   Liver Function Tests: Recent Labs  Lab 03/11/2021 1840  AST 63*  ALT 47*  ALKPHOS 383*  BILITOT 1.1  PROT 7.7  ALBUMIN 3.5   No results for input(s): LIPASE, AMYLASE in the last 168 hours. No results for input(s): AMMONIA in the last 168 hours. CBC: Recent Labs  Lab 03/07/2021 1840 03/16/2021 1919 03/23/21 0128  WBC 12.4*  --  9.9  HGB 11.0* 13.6 10.8*   HCT 36.1 40.0 35.0*  MCV 83.0  --  78.8*  PLT 266  --  273   Cardiac Enzymes: No results for input(s): CKTOTAL, CKMB, CKMBINDEX, TROPONINI in the last 168 hours. CBG: Recent Labs  Lab 03/23/21 0615 03/23/21 0706 03/23/21 0802 03/23/21 0905 03/23/21 1005  GLUCAP 141* 153* 126* 115* 126*   Iron Studies: No results for input(s): IRON, TIBC, TRANSFERRIN, FERRITIN in the last 72 hours. Studies/Results: CT Head Wo Contrast  Result Date: 03/23/2021 CLINICAL DATA:  Delirium. EXAM: CT HEAD WITHOUT CONTRAST TECHNIQUE: Contiguous axial images were obtained from the base of the skull through the vertex without intravenous contrast. COMPARISON:  11/21/2020. FINDINGS: Brain: No acute intracranial hemorrhage, midline shift or mass effect. No extra-axial fluid collection. Periventricular white matter hypodensities are noted bilaterally. There is no hydrocephalus. Vascular: No hyperdense vessel. Atherosclerotic calcification of the carotid siphons and vertebral arteries. Skull: Normal. Negative for fracture or focal lesion. Sinuses/Orbits: Diffuse paranasal sinus mucosal thickening. No acute abnormality. Other: Tubes are present in the oral cavity. IMPRESSION: 1. No acute intracranial process. 2. Periventricular white matter hypodensities, may be associated with chronic microvascular ischemic changes. Electronically Signed   By: Brett Fairy M.D.   On: 03/23/2021 00:55   DG Chest Port 1 View  Result Date: 03/23/2021 CLINICAL DATA:  Intubation. EXAM: PORTABLE CHEST 1 VIEW COMPARISON:  03/18/2021. FINDINGS: The heart is mildly enlarged and the mediastinal structures are within normal limits. Pulmonary vascular congestion is noted bilaterally. Interstitial prominence is noted bilaterally and patchy airspace disease is seen at the left lung base. There is a small left pleural effusion. No pneumothorax is seen. A vascular stent is noted in the left axilla. An enteric tube courses over the stomach and out of  the field of view. The endotracheal tube terminates 1 cm above the carina. No acute osseous abnormality. IMPRESSION: 1. Cardiomegaly with pulmonary vascular congestion. 2. Interstitial prominence bilaterally with patchy airspace disease at the lung bases, possible edema, atelectasis, or infiltrate. 3. Small left pleural effusion. 4. Medical devices as described above. Electronically Signed   By: Brett Fairy M.D.   On: 03/23/2021 00:37   DG Chest Portable 1  View  Result Date: 03/14/2021 CLINICAL DATA:  Altered mental status, initial encounter EXAM: PORTABLE CHEST 1 VIEW COMPARISON:  11/21/2020 FINDINGS: Neva Seat is enlarged but stable. Aortic calcifications are noted. Lungs are well aerated bilaterally. Vascular congestion is seen with mild interstitial edema. No focal infiltrate or effusion is seen. No bony abnormality is noted. IMPRESSION: Changes consistent with mild CHF. No other focal abnormality is noted. Electronically Signed   By: Inez Catalina M.D.   On: 03/24/2021 20:54    ROS: Review of systems not obtained due to patient factors. Physical Exam: Vitals:   03/23/21 0900 03/23/21 0930 03/23/21 1000 03/23/21 1030  BP: (!) 95/45 (!) 90/56 (!) 89/39 (!) 96/52  Pulse: 69 68 66 66  Resp: $Remo'16 16 16 16  'hBDXh$ Temp:      TempSrc:      SpO2: 96% 96% 96% 97%  Weight:      Height:          Weight change:   Intake/Output Summary (Last 24 hours) at 03/23/2021 1104 Last data filed at 03/23/2021 1000 Gross per 24 hour  Intake 2067.04 ml  Output --  Net 2067.04 ml   BP (!) 96/52    Pulse 66    Temp (!) 101 F (38.3 C) (Oral)    Resp 16    Ht $R'5\' 6"'Cn$  (1.676 m)    Wt 60.5 kg    SpO2 97%    BMI 21.53 kg/m  General appearance: Intubated and sedated Head: Normocephalic, without obvious abnormality, atraumatic Resp: ventilated breath sounds bilaterally Cardio: regular rate and rhythm, S1, S2 normal, no murmur, click, rub or gallop GI: soft, non-tender; bowel sounds normal; no masses,  no  organomegaly Extremities: s/p left tma, no edema, LUE AVF +T/B Dialysis Access:  Dialysis Orders: Center: [facility]  on TTS . EDW 57.5 kg HD Bath 2K/2Ca  Time 4 hours Heparin none. Access LUE AVF BFR 400 DFR 500    Hectoral 11 mcg IV/HD Micera 225 mcg every 2 weeks (last given 03/09/21 Venofer  50 mg IV every Tuesday    Assessment/Plan:  Hyperosmolar Hyperglycemia Syndrome - started on insulin drip as well as D5LR at 125 ml/hr.    Acute metabolic encephalopathy - due to HHS coma.  S/p intubation and vent settings per PCCM  ESRD -  Normally on TTS schedule.  No urgent indication for dialysis and she is tentatively scheduled for later today if she remains hemodynamically stable  Hypertension/volume  - low BP's but MAP stable  Anemia  - stable, continue with ESA  Metabolic bone disease -  npo for now  Nutrition - npo for now  Donetta Potts, MD [place] Pager [phone] 03/23/2021, 11:04 AM

## 2021-03-23 NOTE — Progress Notes (Addendum)
Patient arrived from emergency dept with earrings placed in container with patient label sticker. Will place belongings in room with patient.   [id number] - Received cell phone, surge protector cord & phone charger from emergency dept. Placed with patient belongings.   Antonieta Pert, RN  03/23/2021

## 2021-03-23 NOTE — Progress Notes (Signed)
Intubation Procedure Note  [name]  [id number]  1971-01-04  Date:03/23/21  Time:12:14 AM   Provider Performing:Alizia Greif, Raelyn Number    Procedure: Intubation (14970)  Indication(s) Respiratory Failure  Consent Unable to obtain consent due to emergent nature of procedure.   Anesthesia Per MD   Time Out Verified patient identification, verified procedure, site/side was marked, verified correct patient position, special equipment/implants available, medications/allergies/relevant history reviewed, required imaging and test results available.   Sterile Technique Usual hand hygeine, masks, and gloves were used   Procedure Description Patient positioned in bed supine.  Sedation given as noted above.  Patient was intubated with endotracheal tube using Glidescope.  View was Grade 1 full glottis .  Number of attempts was 1.  Colorimetric CO2 detector was consistent with tracheal placement.   Complications/Tolerance None; patient tolerated the procedure well. Chest X-ray is ordered to verify placement.   EBL Minimal   Specimen(s) None

## 2021-03-23 NOTE — H&P (Addendum)
NAME:  [name], MRN:  [id number], DOB:  [birth year], LOS: 0 ADMISSION DATE:  03/04/2021, CONSULTATION DATE:  03/23/21 REFERRING MD:  Deno Etienne, MD CHIEF COMPLAINT:  altered mental status   History of Present Illness:  [name] is a 50 year old lady with PMH significant for type 1 DM on insulin pump, ESRD on HD among multiple medical conditions listed in [place].  Patient presented to the ED with altered mental status.  It was reported that the patient had been last seen around 0930 Am on day of presentation. The patient's sister texted her several but received no response.  Hence the sister called one of the patient's room mate and asked him to check on her.  She was found confused and incoherent. She was brought to the ED for further evaluation.  In the Ed the patient was found to be severely hyperglycemic with BS > 900. She was initially referred to the hospitalist team for admission. However her mental status worsened  so CCM team was asked to admit her. It was reported that the patient had episodes of nausea and vomiting en route to hospital. The nurses described her as incoherent but by the time I was called to see her, she was unresponsive with episodes of grunting. She could not contribute to this consultation.  The patient's sister [name] who contributed to this consultation by phone reported that the patient has had a similar episode like this in the past.  [name] reported that it took her sister several day before she became coherent.  Pertinent  Medical History  Type 1 DM on insulin pump ESRD on HD T/TH/Sat  Significant Hospital Events: Including procedures, antibiotic start and stop dates in addition to other pertinent events     Interim History / Subjective:    Objective   Blood pressure (!) 208/95, pulse 97, temperature 99.8 F (37.7 C), temperature source Oral, resp. rate 19, height 5\' 6"  (1.676 m), weight 60.5 kg, SpO2 100 %.    Vent Mode: PRVC FiO2  (%):  [80 %-100 %] 80 % Set Rate:  [16 bmp] 16 bmp Vt Set:  [480 mL] 480 mL PEEP:  [5 cmH20-8 cmH20] 5 cmH20 Plateau Pressure:  [19 cmH20] 19 cmH20   Intake/Output Summary (Last 24 hours) at 03/23/2021 0205 Last data filed at 03/23/2021 0100 Gross per 24 hour  Intake 21.13 ml  Output --  Net 21.13 ml   Filed Weights   03/10/2021 2327 03/23/21 0100  Weight: 57.4 kg 60.5 kg    Examination: General: unresponsive with grunting nose with stimuli HENT: AT/C, very dry oral cavity Lungs: no wheezes, no rhonchi Cardiovascular: S1, S2, Sinus tachycardia Abdomen: soft, inaudible bowel sounds Extremities: Left A-V fistula with good thrill, lower extremities wounds: Right transmetatarsal amputation with wound; Left ankle wound with missing last 2 toes. Neuro: unresponsive to verbal and tactile stimuli, grunts with noxious stimuli GU: N/A  Resolved Hospital Problem list     Assessment & Plan:  Hyperosmolar hyperglycemia syndrome      - Type 1 DM on insulin pump appears to be poorly controlled Plan: IVF-hydrate, insulin drip per protocol, Will need to start long acting insulin to overlap drip as patient is type 1 DM electrolytes management. Check HGA1c  2. Altered mental status secondary to HHS coma   - Acute metabolic encephalopathy secondary to severe hyperglycemia Patient intubated for airway management and for safe tolerance of CT head. CT head negative for acute pathology Plan: cautious  correction of hyperglycemia  3. Hypovolemia Plan: caution hydration, trend lactic acid for response.  4. Acute hypoxic respiratory failure secondary to airway compromise required intubation Plan: lung protective protocol, vent wean and extubate once impaired mental status resolves  5. ESRD on HD T/TH/S Mild hyperkalemia which should resolved with start of Insulin drip Plan: nephrology consult  6. Multiple wounds present on admission: Right transmetatarsal amputation with wound. Left ankle wound  with missing last 2 toes Plan: wound care consult  7. Hx hyperlipidemia Plan: restart home meds  8. Hx essential hypertension -patient had episode of hypertension after intubation secondary to paralytic agent Plan: sedation and pain management started, Amlodipine restarted.    Best Practice (right click and "Reselect all SmartList Selections" daily)   Diet/type: NPO DVT prophylaxis: prophylactic heparin  GI prophylaxis: PPI Lines: N/A Foley:  N/A Code Status:  full code as per sister.   Labs   CBC: Recent Labs  Lab 03/21/2021 1840 03/02/2021 1919 03/23/21 0128  WBC 12.4*  --  9.9  HGB 11.0* 13.6 10.8*  HCT 36.1 40.0 35.0*  MCV 83.0  --  78.8*  PLT 266  --  867    Basic Metabolic Panel: Recent Labs  Lab 03/12/2021 1840 04/01/2021 1919  NA 127* 125*  K 5.7* 5.6*  CL 85*  --   CO2 24  --   GLUCOSE 931*  --   BUN 44*  --   CREATININE 6.48*  --   CALCIUM 9.0  --    GFR: Estimated Creatinine Clearance: 9.8 mL/min (A) (by C-G formula based on SCr of 6.48 mg/dL (H)). Recent Labs  Lab 04/01/2021 1840 03/18/2021 1844 03/23/2021 2044 03/23/21 0128  WBC 12.4*  --   --  9.9  LATICACIDVEN  --  2.0* 2.0*  --     Liver Function Tests: Recent Labs  Lab 03/29/2021 1840  AST 63*  ALT 47*  ALKPHOS 383*  BILITOT 1.1  PROT 7.7  ALBUMIN 3.5   No results for input(s): LIPASE, AMYLASE in the last 168 hours. No results for input(s): AMMONIA in the last 168 hours.  ABG    Component Value Date/Time   PHART 7.351 03/23/2021 0109   PCO2ART 48.8 (H) 03/23/2021 0109   PO2ART 59.5 (L) 03/23/2021 0109   HCO3 26.1 03/23/2021 0109   TCO2 29 03/26/2021 1919   ACIDBASEDEF 7.0 (H) 11/21/2020 0550   O2SAT 85.3 03/23/2021 0109     Coagulation Profile: No results for input(s): INR, PROTIME in the last 168 hours.  Cardiac Enzymes: No results for input(s): CKTOTAL, CKMB, CKMBINDEX, TROPONINI in the last 168 hours.  HbA1C: Hemoglobin A1C  Date/Time Value Ref Range Status   11/02/2020 10:26 AM 8.1 (A) 4.0 - 5.6 % Final  02/17/2020 01:32 PM 9.4 (A) 4.0 - 5.6 % Final  10/22/2019 12:00 AM 8.4  Final   Hgb A1c MFr Bld  Date/Time Value Ref Range Status  07/30/2018 01:10 AM 10.4 (H) 4.8 - 5.6 % Final    Comment:    (NOTE) Pre diabetes:          5.7%-6.4% Diabetes:              >6.4% Glycemic control for   <7.0% adults with diabetes   06/12/2018 03:22 PM 10.3 (H) 4.6 - 6.5 % Final    Comment:    Glycemic Control Guidelines for People with Diabetes:Non Diabetic:  <6%Goal of Therapy: <7%Additional Action Suggested:  >8%     CBG: Recent Labs  Lab  03/12/2021 1841 03/03/2021 2042 03/26/2021 2221 03/23/21 0101 03/23/21 0132  GLUCAP >600* >600* >600* 564* 446*    Review of Systems:   Unable to obtain because of poor mental status  Past Medical History:  She,  has a past medical history of Anemia, Arthritis, Bronchitis, C. difficile diarrhea (09/26/2014), Cataracts, both eyes, Cellulitis of right foot (06/02/2014), CKD (chronic kidney disease) stage 3, GFR 30-59 ml/min ([place]), Diabetic ulcer of right foot ([place]) (06/02/2014), DVT (deep venous thrombosis) ([facility]) (10/2014), H/O seasonal allergies, History of blood transfusion, Hypercholesteremia, Hypertension, Left foot infection, Neuromuscular disorder ([place]), Neuropathy in diabetes [facility]), Peripheral vascular disease ([place]), Sleep apnea, Staphylococcus aureus bacteremia (10/2014), and Type 1 diabetes ([place]).   Surgical History:   Past Surgical History:  Procedure Laterality Date   AMPUTATION TOE Left 07/24/2015   5th toe and Hallux amputation   AV FISTULA PLACEMENT Left 08/06/2018   Procedure: INSERTION OF BASCILIC VEN TRANSPOSITION 1ST STAGE;  Surgeon: Rosetta Posner, MD;  Location: [facility];  Service: Vascular;  Laterality: Left;   [place] Left 04/29/2019   Procedure: SECOND STAGE BASCILIC VEIN TRANSPOSITION LEFT ARM;  Surgeon: Rosetta Posner, MD;  Location: [facility];  Service: Vascular;  Laterality: Left;    CESAREAN SECTION     x 1   EXOSTECTECTOMY TOE Left 04/11/2016   Procedure: EXOSTECTECTOMY CUBOID AND 5TH METATARSAL AND PLACEMENT OF ANTIBIOTIC BEADS;  Surgeon: Edrick Kins, DPM;  Location: [place];  Service: Podiatry;  Laterality: Left;   EYE SURGERY Bilateral    removed cataracts - laser   FEMORAL-POPLITEAL BYPASS GRAFT Left 05/03/2015   FOOT AMPUTATION THROUGH METATARSAL Right 2016   hemrrhoidectomy     I & D EXTREMITY Right 06/10/2014   Procedure: IRRIGATION AND DEBRIDEMENT Right Foot;  Surgeon: Newt Minion, MD;  Location: [place];  Service: Orthopedics;  Laterality: Right;   I & D EXTREMITY Right 07/31/2018   Procedure: IRRIGATION AND DEBRIDEMENT EXTREMITY;  Surgeon: Edrick Kins, DPM;  Location: [place];  Service: Podiatry;  Laterality: Right;   I & D EXTREMITY Right 08/04/2018   Procedure: IRRIGATION AND DEBRIDEMENT FOOT with placement of wound vac;  Surgeon: Edrick Kins, DPM;  Location: [place];  Service: Podiatry;  Laterality: Right;   INCISION AND DRAINAGE Left 04/11/2016   Procedure: INCISION AND DRAINAGE A DEEP COMPLICATED WOUND OF LEFT FOOT;  Surgeon: Edrick Kins, DPM;  Location: [place];  Service: Podiatry;  Laterality: Left;   INSERTION OF DIALYSIS CATHETER Right 08/06/2018   Procedure: INSERTION OF TUNNELED DIALYSIS CATHETER;  Surgeon: Rosetta Posner, MD;  [facility];  Service: Vascular;  Laterality: Right;   IR FLUORO GUIDE CV LINE RIGHT  09/24/2017   IR FLUORO GUIDE CV LINE RIGHT  08/01/2018   IR REMOVAL TUN CV CATH W/O FL  11/22/2017   IR US GUIDE VASC ACCESS RIGHT  09/24/2017   IR US GUIDE VASC ACCESS RIGHT  08/01/2018   IRRIGATION AND DEBRIDEMENT ABSCESS Left 05/06/2017   Procedure: IRRIGATION AND DEBRIDEMENT ABSCESS LEFT FOOT;  Surgeon: Edrick Kins, DPM;  Location: [facility];  Service: Podiatry;  Laterality: Left;   PERIPHERAL VASCULAR BALLOON ANGIOPLASTY Left 01/20/2020   Procedure: PERIPHERAL VASCULAR BALLOON ANGIOPLASTY;  Surgeon: Marty Heck, MD;  Location: [place] [facility];  Service: Cardiovascular;  Laterality: Left;  Arm fistula   PERIPHERALLY INSERTED CENTRAL CATHETER INSERTION     SKIN GRAFT Right 06/15/2014   SKIN GRAFT Left 06/2015   foot  SKIN SPLIT GRAFT Right 06/15/2014   Procedure: SPLIT THICKNESS SKIN GRAFT RIGHT FOOT;  Surgeon: Newt Minion, MD;  Location: [place];  Service: Orthopedics;  Laterality: Right;   TRANSMETATARSAL AMPUTATION Right    TRANSMETATARSAL AMPUTATION Right 09/11/2014   WISDOM TOOTH EXTRACTION       Social History:   reports that she quit smoking about 12 years ago. Her smoking use included cigarettes. She has never used smokeless tobacco. She reports that she does not currently use alcohol. She reports that she does not use drugs.   Family History:  Her family history includes Dementia in her mother; Hyperlipidemia in her mother.   Allergies Allergies  Allergen Reactions   Cleocin [Clindamycin Hcl] Diarrhea   Lisinopril Other (See Comments)    Elevated potassium per pt report   Amoxicillin Diarrhea, Nausea Only and Other (See Comments)    Did it involve swelling of the face/tongue/throat, SOB, or low BP? No Did it involve sudden or severe rash/hives, skin peeling, or any reaction on the inside of your mouth or nose? No Did you need to seek medical attention at a hospital or doctor's office? No When did it last happen?      10 + years If all above answers are "NO", may proceed with cephalosporin use.     Bactrim [Sulfamethoxazole-Trimethoprim] Diarrhea and Nausea Only     Home Medications  Prior to Admission medications   Medication Sig Start Date End Date Taking? Authorizing Provider  albuterol (PROVENTIL HFA;VENTOLIN HFA) 108 (90 BASE) MCG/ACT inhaler Inhale 2 puffs into the lungs every 6 (six) hours as needed for wheezing or shortness of breath.     [provider]  amLODipine (NORVASC) 5 MG tablet Take 5 mg by mouth daily. 11/07/20   [provider]  aspirin EC 81 MG tablet Take  81 mg by mouth daily.    [provider]  B Complex-C-Zn-Folic Acid (DIALYVITE 409 WITH ZINC) 0.8 MG TABS Take 1 tablet by mouth daily. 03/09/19   [provider]  calcitRIOL (ROCALTROL) 0.5 MCG capsule Take 1 capsule daily 08/13/17   Elayne Snare, MD  calcium acetate (PHOSLO) 667 MG capsule Take 2 capsules (1,334 mg total) by mouth 3 (three) times daily with meals. 08/08/18   Danford, Suann Larry, MD  Continuous Blood Gluc Receiver ([place]) [place] 1 each by Does not apply route See admin instructions. Use Dexcom Receiver to monitor blood sugar continuously. 03/21/21   Elayne Snare, MD  Continuous Blood Gluc Sensor (DEXCOM G6 SENSOR) MISC 1 each by Does not apply route See admin instructions. Use one sensors once every 10 days to monitor blood sugars. 03/21/21   Elayne Snare, MD  Continuous Blood Gluc Transmit (DEXCOM G6 TRANSMITTER) MISC 1 each by Does not apply route See admin instructions. Use one transmitter once every 90 days. 03/21/21   Elayne Snare, MD  CONTOUR NEXT TEST test strip USE AS INSTRUCTED TO CHECK BLOOD SUGAR 4 TIMES A DAY. DX E10.65 06/08/19   Elayne Snare, MD  Cyanocobalamin (B-12) 5000 MCG CAPS Take 5,000 mcg by mouth daily.    [provider]  diphenhydrAMINE (BENADRYL) 25 MG tablet Take 25 mg by mouth daily as needed.     [provider]  folic acid (FOLVITE) 811 MCG tablet Take 400 mcg by mouth daily.    [provider]  insulin aspart (NOVOLOG) 100 UNIT/ML injection USE A MAX OF 65 UNITS DAILY VIA INSULIN PUMP 06/24/20   Elayne Snare, MD  Insulin Human (INSULIN PUMP) SOLN Inject into the skin. Insulin aspart (Novolog) 100 unit/ml---MAX 65 UNITS DAILY    [provider]  lidocaine-prilocaine (EMLA) cream Apply 1 application topically daily as needed. Port access 08/16/20   [provider]  norethindrone (AYGESTIN) 5 MG tablet Take 5 mg by mouth See admin instructions. 5mg  daily for 14 days, then off for 14 days, then  repeat cycle. Patient not taking: Reported on 01/13/2021 02/17/19   [provider]  NOVOLOG 100 UNIT/ML injection USE a max OF 65 UNITS EVERY DAY via insulin pump 03/20/21   Elayne Snare, MD  Omega-3 Fatty Acids (OMEGA 3 500) 500 MG CAPS Take 500 mg by mouth daily.    [provider]  ondansetron (ZOFRAN ODT) 4 MG disintegrating tablet Take 1 tablet (4 mg total) by mouth every 8 (eight) hours as needed for nausea or vomiting. 10/08/20   Luna Fuse, MD  pravastatin (PRAVACHOL) 40 MG tablet Take 1 tablet (40 mg total) by mouth daily. 11/07/20   Elayne Snare, MD    Thank you for allowing me the privilege to care for this patient. Critical care time: 85 minutes

## 2021-03-23 NOTE — Progress Notes (Signed)
[facility] Progress Note Patient Name: [name] DOB: 1970/12/21 MRN: [id number]   Date of Service  03/23/2021  HPI/Events of Note  Agitation - Patient is already on Propofol 80 mcg/kg/min. Nursing request for restraint orders.   eICU Interventions  Plan: Versed 2 mg IV Q 2 hours PRN agitation or sedation. Bilateral soft wrist restraints X 7 hours.      Intervention Category Major Interventions: Delirium, psychosis, severe agitation - evaluation and management  Keinan Brouillet [place] 03/23/2021, 1:58 AM

## 2021-03-23 NOTE — Progress Notes (Addendum)
NAME:  [name], MRN:  [id number], DOB:  1970/06/19, LOS: 0 ADMISSION DATE:  03/30/2021, CONSULTATION DATE:  12/22 REFERRING MD:  Tyrone Nine, CHIEF COMPLAINT:  AMS/HHS   History of Present Illness:  [name] is a 50 year old lady with PMH significant for type 1 DM on insulin pump, ESRD on HD among multiple medical conditions listed in [place]. Patient presented to the ED with altered mental status.  It was reported that the patient had been last seen around 0930 AM on day of presentation. The patient's sister texted her several times but received no response.  Hence the sister called one of the patient's roommate and asked him to check on her.  She was found confused and incoherent. She was brought to the ED for further evaluation.   In the Ed the patient was found to be severely hyperglycemic with BS > 900. She was initially referred to the hospitalist team for admission. However her mental status worsened  so CCM team was asked to admit her. It was reported that the patient had episodes of nausea and vomiting en route to hospital. The nurses described her as incoherent but by the time PCCM was called to see her, she was unresponsive with episodes of grunting. She could not contribute to this consultation.   The patient's sister [name] who contributed to this consultation by phone reported that the patient has had a similar episode like this in the past.  [name] reported that it took her sister several day before she became coherent.  Pertinent  Medical History  T1DM on insulin pump ESRD on HD T/Th/S Previous DVT Hyperlipidemia HTN Diabetic neuropathy OSA PVD  Significant Hospital Events: Including procedures, antibiotic start and stop dates in addition to other pertinent events   12/22 admitted to ICU, patient intubated and started on insulin drip  Interim History / Subjective:  Patient sedated and unresponsive. Unable to follow commands.   Objective   Blood pressure (!)  102/53, pulse 81, temperature (!) 100.9 F (38.3 C), temperature source Oral, resp. rate 16, height 5\' 6"  (1.676 m), weight 60.5 kg, SpO2 98 %.    Vent Mode: PRVC FiO2 (%):  [75 %-100 %] 75 % Set Rate:  [16 bmp] 16 bmp Vt Set:  [480 mL] 480 mL PEEP:  [5 cmH20-8 cmH20] 5 cmH20 Plateau Pressure:  [19 cmH20] 19 cmH20   Intake/Output Summary (Last 24 hours) at 03/23/2021 9147 Last data filed at 03/23/2021 0600 Gross per 24 hour  Intake 1382.97 ml  Output --  Net 1382.97 ml   Filed Weights   03/28/2021 2327 03/23/21 0100  Weight: 57.4 kg 60.5 kg    Examination: General: sedated/unresponsive female, NAD HENT: [place]/AT, ETT in place Lungs: CTAB, no wheezing or crackles. On vent Cardiovascular: RRR, no murmurs Abdomen: soft, nontender, +BS Extremities: left AV fisutla with good thrill. R transmetatarsal amputation, L foot s/p 5th digit amputation. Trace LE edema Neuro: Unresponsive to verbal or tactile simtuli. Will grunt with noxious stimuli  BG: 900 on arrival --> 153 A1c 7.8 Phos 7.2 Na 132, K 4.8   Resolved Hospital Problem list     Assessment & Plan:  HHS Type 1 diabetes on insulin pump BG >900 on admission, pH is normal. +BHB. A1c 7.8.  - Continue insulin drip per protocol - IVF: D5 LR 125 cc/hr  - Urinalysis to be collected - Start semglee 5u bid  - Start tube feeds   Acute metabolic encephalopathy 2/2 HHS coma Agitation while  on ventilator   CT head negative for acute intracranial pathology. Encephalopathy should correct with improvement in BG control. - Continue fentanyl and propofol, wean as able  Acute hypoxic respiratory failure 2/2 airway compromise Patient intubated upon admission. Vent settings: PRVC, RR 16, TV 480, PEEP 5, FiO2 75%. Peak presure 23 and plateau 19 - Continue vent support pending improvement in mental status - Wean FiO2 as tolerated  ESRD on HD T/Th/S Hyperkalemia Potassium 5.7 on arrival, improved to 4.8 with insulin drip.  -  Nephrology consulted for HD  HTN Patient became hypertensive, frequently during episodes of agitation.  - Amlodipine 5 mg daily - Hydralazine 10 mg q4h prn - Sedation for agitation as above  Hyperlipidemia - Pravastatin 40 mg daily  Anemia Likely secondary to anemia of chronic kidney disease. Hb stable at 10.8.    Best Practice (right click and "Reselect all SmartList Selections" daily)   Diet/type: tubefeeds DVT prophylaxis: prophylactic heparin  GI prophylaxis: PPI Lines: N/A Foley:  N/A Code Status:  full code Last date of multidisciplinary goals of care discussion [12/21]  Labs   CBC: Recent Labs  Lab 04/01/2021 1840 03/13/2021 1919 03/23/21 0128  WBC 12.4*  --  9.9  HGB 11.0* 13.6 10.8*  HCT 36.1 40.0 35.0*  MCV 83.0  --  78.8*  PLT 266  --  102    Basic Metabolic Panel: Recent Labs  Lab 03/29/2021 1840 03/28/2021 1919 03/23/21 0128 03/23/21 0444  NA 127* 125* 130* 132*  K 5.7* 5.6* 4.7 4.8  CL 85*  --  88* 92*  CO2 24  --  26 24  GLUCOSE 931*  --  516* 186*  BUN 44*  --  49* 50*  CREATININE 6.48*  --  6.75* 6.86*  CALCIUM 9.0  --  8.8* 8.5*  MG  --   --  2.5* 2.2  PHOS  --   --  7.5* 7.2*   GFR: Estimated Creatinine Clearance: 9.3 mL/min (A) (by C-G formula based on SCr of 6.86 mg/dL (H)). Recent Labs  Lab 03/07/2021 1840 03/08/2021 1844 03/21/2021 2044 03/23/21 0128  WBC 12.4*  --   --  9.9  LATICACIDVEN  --  2.0* 2.0*  --     Liver Function Tests: Recent Labs  Lab 03/27/2021 1840  AST 63*  ALT 47*  ALKPHOS 383*  BILITOT 1.1  PROT 7.7  ALBUMIN 3.5   No results for input(s): LIPASE, AMYLASE in the last 168 hours. No results for input(s): AMMONIA in the last 168 hours.  ABG    Component Value Date/Time   PHART 7.351 03/23/2021 0109   PCO2ART 48.8 (H) 03/23/2021 0109   PO2ART 59.5 (L) 03/23/2021 0109   HCO3 26.1 03/23/2021 0109   TCO2 29 03/10/2021 1919   ACIDBASEDEF 7.0 (H) 11/21/2020 0550   O2SAT 85.3 03/23/2021 0109     Coagulation  Profile: No results for input(s): INR, PROTIME in the last 168 hours.  Cardiac Enzymes: No results for input(s): CKTOTAL, CKMB, CKMBINDEX, TROPONINI in the last 168 hours.  HbA1C: Hemoglobin A1C  Date/Time Value Ref Range Status  11/02/2020 10:26 AM 8.1 (A) 4.0 - 5.6 % Final  02/17/2020 01:32 PM 9.4 (A) 4.0 - 5.6 % Final  10/22/2019 12:00 AM 8.4  Final   Hgb A1c MFr Bld  Date/Time Value Ref Range Status  03/23/2021 04:44 AM 7.8 (H) 4.8 - 5.6 % Final    Comment:    (NOTE) Pre diabetes:  5.7%-6.4%  Diabetes:              >6.4%  Glycemic control for   <7.0% adults with diabetes   07/30/2018 01:10 AM 10.4 (H) 4.8 - 5.6 % Final    Comment:    (NOTE) Pre diabetes:          5.7%-6.4% Diabetes:              >6.4% Glycemic control for   <7.0% adults with diabetes     CBG: Recent Labs  Lab 03/23/21 0241 03/23/21 0312 03/23/21 0413 03/23/21 0515 03/23/21 0615  GLUCAP 409* 344* 260* 156* 141*    Review of Systems:     Critical care time: 30 minutes

## 2021-03-24 DIAGNOSIS — E1065 Type 1 diabetes mellitus with hyperglycemia: Secondary | ICD-10-CM

## 2021-03-24 DIAGNOSIS — E870 Hyperosmolality and hypernatremia: Secondary | ICD-10-CM

## 2021-03-24 DIAGNOSIS — D649 Anemia, unspecified: Secondary | ICD-10-CM

## 2021-03-24 DIAGNOSIS — E109 Type 1 diabetes mellitus without complications: Secondary | ICD-10-CM

## 2021-03-24 DIAGNOSIS — I120 Hypertensive chronic kidney disease with stage 5 chronic kidney disease or end stage renal disease: Secondary | ICD-10-CM

## 2021-03-24 DIAGNOSIS — R451 Restlessness and agitation: Secondary | ICD-10-CM

## 2021-03-24 DIAGNOSIS — E1069 Type 1 diabetes mellitus with other specified complication: Principal | ICD-10-CM

## 2021-03-24 DIAGNOSIS — Z9911 Dependence on respirator [ventilator] status: Secondary | ICD-10-CM

## 2021-03-24 DIAGNOSIS — E875 Hyperkalemia: Secondary | ICD-10-CM

## 2021-03-24 DIAGNOSIS — Z9641 Presence of insulin pump (external) (internal): Secondary | ICD-10-CM

## 2021-03-24 LAB — PHOSPHORUS
Phosphorus: 2.5 mg/dL (ref 2.5–4.6)
Phosphorus: 5.6 mg/dL — ABNORMAL HIGH (ref 2.5–4.6)

## 2021-03-24 LAB — BASIC METABOLIC PANEL
Anion gap: 8 (ref 5–15)
Anion gap: 11 (ref 5–15)
Anion gap: 12 (ref 5–15)
Anion gap: 11 (ref 5–15)
Anion gap: 11 (ref 5–15)
BUN: 14 mg/dL (ref 6–20)
BUN: 27 mg/dL — ABNORMAL HIGH (ref 6–20)
BUN: 29 mg/dL — ABNORMAL HIGH (ref 6–20)
BUN: 31 mg/dL — ABNORMAL HIGH (ref 6–20)
BUN: 33 mg/dL — ABNORMAL HIGH (ref 6–20)
CO2: 29 mmol/L (ref 22–32)
CO2: 23 mmol/L (ref 22–32)
CO2: 23 mmol/L (ref 22–32)
CO2: 25 mmol/L (ref 22–32)
CO2: 25 mmol/L (ref 22–32)
Calcium: 7.3 mg/dL — ABNORMAL LOW (ref 8.9–10.3)
Calcium: 8 mg/dL — ABNORMAL LOW (ref 8.9–10.3)
Calcium: 8.1 mg/dL — ABNORMAL LOW (ref 8.9–10.3)
Calcium: 8.4 mg/dL — ABNORMAL LOW (ref 8.9–10.3)
Calcium: 8.3 mg/dL — ABNORMAL LOW (ref 8.9–10.3)
Chloride: 98 mmol/L (ref 98–111)
Chloride: 99 mmol/L (ref 98–111)
Chloride: 97 mmol/L — ABNORMAL LOW (ref 98–111)
Chloride: 98 mmol/L (ref 98–111)
Chloride: 98 mmol/L (ref 98–111)
Creatinine, Ser: 2.28 mg/dL — ABNORMAL HIGH (ref 0.44–1.00)
Creatinine, Ser: 4.35 mg/dL — ABNORMAL HIGH (ref 0.44–1.00)
Creatinine, Ser: 4.46 mg/dL — ABNORMAL HIGH (ref 0.44–1.00)
Creatinine, Ser: 4.67 mg/dL — ABNORMAL HIGH (ref 0.44–1.00)
Creatinine, Ser: 4.82 mg/dL — ABNORMAL HIGH (ref 0.44–1.00)
GFR, Estimated: 26 mL/min — ABNORMAL LOW (ref >60)
GFR, Estimated: 12 mL/min — ABNORMAL LOW (ref >60)
GFR, Estimated: 11 mL/min — ABNORMAL LOW (ref >60)
GFR, Estimated: 11 mL/min — ABNORMAL LOW (ref >60)
GFR, Estimated: 10 mL/min — ABNORMAL LOW (ref >60)
Glucose, Bld: 97 mg/dL (ref 70–99)
Glucose, Bld: 104 mg/dL — ABNORMAL HIGH (ref 70–99)
Glucose, Bld: 119 mg/dL — ABNORMAL HIGH (ref 70–99)
Glucose, Bld: 120 mg/dL — ABNORMAL HIGH (ref 70–99)
Glucose, Bld: 122 mg/dL — ABNORMAL HIGH (ref 70–99)
Potassium: 2.8 mmol/L — ABNORMAL LOW (ref 3.5–5.1)
Potassium: 5 mmol/L (ref 3.5–5.1)
Potassium: 6.2 mmol/L — ABNORMAL HIGH (ref 3.5–5.1)
Potassium: 4 mmol/L (ref 3.5–5.1)
Potassium: 4.2 mmol/L (ref 3.5–5.1)
Sodium: 135 mmol/L (ref 135–145)
Sodium: 133 mmol/L — ABNORMAL LOW (ref 135–145)
Sodium: 132 mmol/L — ABNORMAL LOW (ref 135–145)
Sodium: 134 mmol/L — ABNORMAL LOW (ref 135–145)
Sodium: 134 mmol/L — ABNORMAL LOW (ref 135–145)

## 2021-03-24 LAB — GLUCOSE, CAPILLARY
Glucose-Capillary: 100 mg/dL — ABNORMAL HIGH (ref 70–99)
Glucose-Capillary: 103 mg/dL — ABNORMAL HIGH (ref 70–99)
Glucose-Capillary: 107 mg/dL — ABNORMAL HIGH (ref 70–99)
Glucose-Capillary: 111 mg/dL — ABNORMAL HIGH (ref 70–99)
Glucose-Capillary: 113 mg/dL — ABNORMAL HIGH (ref 70–99)
Glucose-Capillary: 119 mg/dL — ABNORMAL HIGH (ref 70–99)
Glucose-Capillary: 120 mg/dL — ABNORMAL HIGH (ref 70–99)
Glucose-Capillary: 121 mg/dL — ABNORMAL HIGH (ref 70–99)
Glucose-Capillary: 123 mg/dL — ABNORMAL HIGH (ref 70–99)
Glucose-Capillary: 130 mg/dL — ABNORMAL HIGH (ref 70–99)
Glucose-Capillary: 139 mg/dL — ABNORMAL HIGH (ref 70–99)
Glucose-Capillary: 142 mg/dL — ABNORMAL HIGH (ref 70–99)
Glucose-Capillary: 78 mg/dL (ref 70–99)
Glucose-Capillary: 98 mg/dL (ref 70–99)

## 2021-03-24 LAB — CBC
HCT: 29.2 % — ABNORMAL LOW (ref 36.0–46.0)
Hemoglobin: 9.1 g/dL — ABNORMAL LOW (ref 12.0–15.0)
MCH: 24.5 pg — ABNORMAL LOW (ref 26.0–34.0)
MCHC: 31.2 g/dL (ref 30.0–36.0)
MCV: 78.5 fL — ABNORMAL LOW (ref 80.0–100.0)
Platelets: 164 10*3/uL (ref 150–400)
RBC: 3.72 MIL/uL — ABNORMAL LOW (ref 3.87–5.11)
RDW: 18 % — ABNORMAL HIGH (ref 11.5–15.5)
WBC: 3.4 10*3/uL — ABNORMAL LOW (ref 4.0–10.5)
nRBC: 0 % (ref 0.0–0.2)

## 2021-03-24 LAB — HEPATITIS B SURFACE ANTIBODY, QUANTITATIVE: Hep B S AB Quant (Post): 14.4 m[IU]/mL (ref >9.9)

## 2021-03-24 LAB — MAGNESIUM
Magnesium: 1.8 mg/dL (ref 1.7–2.4)
Magnesium: 2.1 mg/dL (ref 1.7–2.4)

## 2021-03-24 MED ORDER — DEXMEDETOMIDINE HCL IN NACL 400 MCG/100ML IV SOLN
0.4000 ug/kg/h | INTRAVENOUS | Status: DC
  Administered 2021-03-24: 08:00:00 0.4 ug/kg/h via INTRAVENOUS
  Administered 2021-03-24 – 2021-03-25 (×2): 0.8 ug/kg/h via INTRAVENOUS
  Administered 2021-03-25 (×2): 1.2 ug/kg/h via INTRAVENOUS
  Administered 2021-03-26: 02:00:00 1 ug/kg/h via INTRAVENOUS
  Administered 2021-03-26: 16:00:00 0.4 ug/kg/h via INTRAVENOUS
  Filled 2021-03-24 (×7): qty 100

## 2021-03-24 MED ORDER — INSULIN ASPART 100 UNIT/ML IJ SOLN
0.0000 [IU] | INTRAMUSCULAR | Status: DC
  Administered 2021-03-24 – 2021-03-25 (×4): 1 [IU] via SUBCUTANEOUS
  Administered 2021-03-25: 20:00:00 2 [IU] via SUBCUTANEOUS
  Administered 2021-03-25 – 2021-03-26 (×2): 1 [IU] via SUBCUTANEOUS
  Administered 2021-03-26: 18:00:00 9 [IU] via SUBCUTANEOUS
  Administered 2021-03-26: 12:00:00 7 [IU] via SUBCUTANEOUS

## 2021-03-24 NOTE — Progress Notes (Signed)
[facility] Progress Note    Assessment/ Plan:    Hyperosmolar Hyperglycemia Syndrome - on insulin gtt. Per PCCM  Acute metabolic encephalopathy - due to HHS coma.  S/p intubation and vent settings per PCCM  ESRD -  Will maintain TTS schedule  Hypertension/volume  - low BP's but MAP stable  Anemia  - stable, continue with ESA  Metabolic bone disease -  npo for now  Nutrition - npo for now  Outpatient Dialysis Orders: Center: [facility]  on TTS . EDW 57.5 kg HD Bath 2K/2Ca  Time 4 hours Heparin none. Access LUE AVF BFR 400 DFR 500    Hectoral 11 mcg IV/HD Micera 225 mcg every 2 weeks (last given 03/09/21 Venofer  50 mg IV every Tuesday  [name], MD [facility]    Subjective:   No acute events overnight. Net uf 500cc with HD yesterday. Remains hemodynamically stable   Objective:   BP (!) 118/46    Pulse (!) 58    Temp 97.6 F (36.4 C) (Oral)    Resp 16    Ht $R'5\' 6"'fq$  (1.676 m)    Wt 61.7 kg    SpO2 100%    BMI 21.95 kg/m   Intake/Output Summary (Last 24 hours) at 03/24/2021 0741 Last data filed at 03/24/2021 0500 Gross per 24 hour  Intake 2976.2 ml  Output 500 ml  Net 2476.2 ml   Weight change: 3.1 kg  Physical Exam: NLZ:JQBHALP CVS:s1s2, rrr Resp:intubated, cta bl FXT:KWIO Ext:no sig edema Neuro: sedated Dialysis access: LUE AVF +b/t  Imaging: CT Head Wo Contrast  Result Date: 03/23/2021 CLINICAL DATA:  Delirium. EXAM: CT HEAD WITHOUT CONTRAST TECHNIQUE: Contiguous axial images were obtained from the base of the skull through the vertex without intravenous contrast. COMPARISON:  11/21/2020. FINDINGS: Brain: No acute intracranial hemorrhage, midline shift or mass effect. No extra-axial fluid collection. Periventricular white matter hypodensities are noted bilaterally. There is no hydrocephalus. Vascular: No hyperdense vessel. Atherosclerotic calcification of the carotid siphons and vertebral arteries. Skull: Normal. Negative for fracture or  focal lesion. Sinuses/Orbits: Diffuse paranasal sinus mucosal thickening. No acute abnormality. Other: Tubes are present in the oral cavity. IMPRESSION: 1. No acute intracranial process. 2. Periventricular white matter hypodensities, may be associated with chronic microvascular ischemic changes. Electronically Signed   By: Brett Fairy M.D.   On: 03/23/2021 00:55   DG Chest Port 1 View  Result Date: 03/23/2021 CLINICAL DATA:  Intubation. EXAM: PORTABLE CHEST 1 VIEW COMPARISON:  03/03/2021. FINDINGS: The heart is mildly enlarged and the mediastinal structures are within normal limits. Pulmonary vascular congestion is noted bilaterally. Interstitial prominence is noted bilaterally and patchy airspace disease is seen at the left lung base. There is a small left pleural effusion. No pneumothorax is seen. A vascular stent is noted in the left axilla. An enteric tube courses over the stomach and out of the field of view. The endotracheal tube terminates 1 cm above the carina. No acute osseous abnormality. IMPRESSION: 1. Cardiomegaly with pulmonary vascular congestion. 2. Interstitial prominence bilaterally with patchy airspace disease at the lung bases, possible edema, atelectasis, or infiltrate. 3. Small left pleural effusion. 4. Medical devices as described above. Electronically Signed   By: Brett Fairy M.D.   On: 03/23/2021 00:37   DG Chest Portable 1 View  Result Date: 03/04/2021 CLINICAL DATA:  Altered mental status, initial encounter EXAM: PORTABLE CHEST 1 VIEW COMPARISON:  11/21/2020 FINDINGS: Neva Seat is enlarged but stable. Aortic calcifications are noted. Lungs are  well aerated bilaterally. Vascular congestion is seen with mild interstitial edema. No focal infiltrate or effusion is seen. No bony abnormality is noted. IMPRESSION: Changes consistent with mild CHF. No other focal abnormality is noted. Electronically Signed   By: Inez Catalina M.D.   On: 03/27/2021 20:54    Labs: BMET Recent Labs  Lab  03/23/21 0128 03/23/21 0444 03/23/21 0923 03/23/21 1225 03/23/21 1902 03/23/21 2141 03/24/21 0231  NA 130* 132* 135 135 134* 134* 135  K 4.7 4.8 5.2* 5.1 4.7 4.4 2.8*  CL 88* 92* 94* 95* 93* 95* 98  CO2 $Re'26 24 27 26 25 25 29  'dBZ$ GLUCOSE 516* 186* 123* 100* 123* 99 97  BUN 49* 50* 53* 52* 57* 58* 14  CREATININE 6.75* 6.86* 7.02* 7.15* 7.24* 7.35* 2.28*  CALCIUM 8.8* 8.5* 8.5* 8.6* 8.7* 8.6* 7.3*  PHOS 7.5* 7.2* 6.9* 6.9* 7.2* 7.4* 2.5   CBC Recent Labs  Lab 03/21/2021 1840 03/04/2021 1919 03/23/21 0128 03/24/21 0231  WBC 12.4*  --  9.9 3.4*  HGB 11.0* 13.6 10.8* 9.1*  HCT 36.1 40.0 35.0* 29.2*  MCV 83.0  --  78.8* 78.5*  PLT 266  --  273 164    Medications:     amLODipine  5 mg Per Tube Daily   aspirin  162 mg Per Tube Daily   chlorhexidine gluconate (MEDLINE KIT)  15 mL Mouth Rinse BID   Chlorhexidine Gluconate Cloth  6 each Topical Q0600   Chlorhexidine Gluconate Cloth  6 each Topical Q0600   collagenase   Topical Daily   darbepoetin (ARANESP) injection - DIALYSIS  40 mcg Intravenous Q Thu-HD   docusate  100 mg Per Tube BID   folic acid  1 mg Per Tube Daily   heparin  5,000 Units Subcutaneous Q8H   insulin glargine-yfgn  5 Units Subcutaneous BID   mouth rinse  15 mL Mouth Rinse 10 times per day   pantoprazole (PROTONIX) IV  40 mg Intravenous Q24H   polyethylene glycol  17 g Per Tube Daily   pravastatin  40 mg Per Tube q1800      [name], MD [facility] 03/24/2021, 7:41 AM

## 2021-03-24 NOTE — Progress Notes (Signed)
NAME:  [name], MRN:  [id number], DOB:  11/26/1970, LOS: 1 ADMISSION DATE:  03/12/2021, CONSULTATION DATE:  12/22 REFERRING MD:  Tyrone Nine, CHIEF COMPLAINT:  AMS/HHS   History of Present Illness:  [name] is a 50 year old lady with PMH significant for type 1 DM on insulin pump, ESRD on HD among multiple medical conditions listed in [place]. Patient presented to the ED with altered mental status.  It was reported that the patient had been last seen around 0930 AM on day of presentation. The patient's sister texted her several times but received no response.  Hence the sister called one of the patient's roommate and asked him to check on her.  She was found confused and incoherent. She was brought to the ED for further evaluation.   In the Ed the patient was found to be severely hyperglycemic with BS > 900. She was initially referred to the hospitalist team for admission. However her mental status worsened  so CCM team was asked to admit her. It was reported that the patient had episodes of nausea and vomiting en route to hospital. The nurses described her as incoherent but by the time PCCM was called to see her, she was unresponsive with episodes of grunting. She could not contribute to this consultation.   The patient's sister [name] who contributed to this consultation by phone reported that the patient has had a similar episode like this in the past.  [name] reported that it took her sister several day before she became coherent.  Pertinent  Medical History  T1DM on insulin pump ESRD on HD T/Th/S Previous DVT Hyperlipidemia HTN Diabetic neuropathy OSA PVD  Significant Hospital Events: Including procedures, antibiotic start and stop dates in addition to other pertinent events   12/22 admitted to ICU, patient intubated and started on insulin drip  Interim History / Subjective:  Had dialysis overnight, very little fluid removed due to hypotension. Still on insulin drip. Tube  feeds at goal.  Objective   Blood pressure (!) 118/46, pulse (!) 57, temperature 97.9 F (36.6 C), temperature source Axillary, resp. rate 16, height 5\' 6"  (1.676 m), weight 61.7 kg, SpO2 100 %.    Vent Mode: PRVC FiO2 (%):  [40 %-60 %] 40 % Set Rate:  [16 bmp] 16 bmp Vt Set:  [480 mL] 480 mL PEEP:  [5 cmH20] 5 cmH20 Plateau Pressure:  [17 cmH20-22 cmH20] 20 cmH20   Intake/Output Summary (Last 24 hours) at 03/24/2021 0802 Last data filed at 03/24/2021 0500 Gross per 24 hour  Intake 2806.11 ml  Output 500 ml  Net 2306.11 ml   Filed Weights   03/23/21 0100 03/23/21 2200 03/24/21 0500  Weight: 60.5 kg 60.5 kg 61.7 kg    Examination: Gen:      Intubated, sedated, acutely ill appearing HEENT:  ETT to vent Lungs:    sounds of mechanical ventilation auscultated no wheezes CV:         systolic murmur, RRR Abd:      + bowel sounds; soft, non-tender; no palpable masses, no distension Ext:    getting upper extremity non pitting edema, LUE AVF +thrill and bruit Skin:      Warm and dry; no rashes,  Neuro:   sedated, RASS -2, moves all 4 extremities  CBGs improved control Potassium 2.8 Cr 2.28  Hgb 9.1  Resolved Hospital Problem list     Assessment & Plan:  HHS Type 1 diabetes on insulin pump BG >900 on admission,  pH is normal. +BHB. A1c 7.8.  - Continue insulin drip per protocol - IVF: D5 LR 125 cc/hr  - Urinalysis to be collected - continue semglee 5u bid  - continue tube feeds   Acute metabolic encephalopathy 2/2 HHS coma Agitation while on ventilator   CT head negative for acute intracranial pathology. Encephalopathy should correct with improvement in BG control. - Continue fentanyl and propofol, wean as able - switch to precedex/fentanyl  Acute hypoxic respiratory failure 2/2 airway compromise Patient intubated upon admission. Vent settings: PRVC, RR 16, TV 480, PEEP 5, FiO2 75%. Peak presure 23 and plateau 19 - Continue vent support pending improvement in mental  status - Wean FiO2 as tolerated - SBT and extubate per protocol as able  ESRD on HD T/Th/S Hypokalemia - repeat BMET pending, will wait to replace - Nephrology consulted for HD  HTN Patient became hypertensive, frequently during episodes of agitation.  - Amlodipine 5 mg daily - Hydralazine 10 mg q4h prn - Sedation for agitation as above  Hyperlipidemia - Pravastatin 40 mg daily  Anemia Likely secondary to anemia of chronic kidney disease. Hb stable at 10.8.    Best Practice (right click and "Reselect all SmartList Selections" daily)   Diet/type: tubefeeds DVT prophylaxis: prophylactic heparin  GI prophylaxis: PPI Lines: N/A Foley:  N/A Code Status:  full code Last date of multidisciplinary goals of care discussion [12/22 sister updated over the phone. Will update again today. ]  Labs   CBC: Recent Labs  Lab 03/08/2021 1840 04/01/2021 1919 03/23/21 0128 03/24/21 0231  WBC 12.4*  --  9.9 3.4*  HGB 11.0* 13.6 10.8* 9.1*  HCT 36.1 40.0 35.0* 29.2*  MCV 83.0  --  78.8* 78.5*  PLT 266  --  273 481    Basic Metabolic Panel: Recent Labs  Lab 03/23/21 0923 03/23/21 1225 03/23/21 1902 03/23/21 2141 03/24/21 0231  NA 135 135 134* 134* 135  K 5.2* 5.1 4.7 4.4 2.8*  CL 94* 95* 93* 95* 98  CO2 27 26 25 25 29   GLUCOSE 123* 100* 123* 99 97  BUN 53* 52* 57* 58* 14  CREATININE 7.02* 7.15* 7.24* 7.35* 2.28*  CALCIUM 8.5* 8.6* 8.7* 8.6* 7.3*  MG 2.1 2.1 2.2 2.1 1.8  PHOS 6.9* 6.9* 7.2* 7.4* 2.5   GFR: Estimated Creatinine Clearance: 27.9 mL/min (A) (by C-G formula based on SCr of 2.28 mg/dL (H)). Recent Labs  Lab 03/17/2021 1840 03/05/2021 1844 03/17/2021 2044 03/23/21 0128 03/24/21 0231  WBC 12.4*  --   --  9.9 3.4*  LATICACIDVEN  --  2.0* 2.0*  --   --     Liver Function Tests: Recent Labs  Lab 03/09/2021 1840  AST 63*  ALT 47*  ALKPHOS 383*  BILITOT 1.1  PROT 7.7  ALBUMIN 3.5   No results for input(s): LIPASE, AMYLASE in the last 168 hours. No results for  input(s): AMMONIA in the last 168 hours.  ABG    Component Value Date/Time   PHART 7.351 03/23/2021 0109   PCO2ART 48.8 (H) 03/23/2021 0109   PO2ART 59.5 (L) 03/23/2021 0109   HCO3 26.1 03/23/2021 0109   TCO2 29 03/29/2021 1919   ACIDBASEDEF 7.0 (H) 11/21/2020 0550   O2SAT 85.3 03/23/2021 0109     Coagulation Profile: No results for input(s): INR, PROTIME in the last 168 hours.  Cardiac Enzymes: No results for input(s): CKTOTAL, CKMB, CKMBINDEX, TROPONINI in the last 168 hours.  HbA1C: Hemoglobin A1C  Date/Time Value Ref Range Status  11/02/2020 10:26 AM 8.1 (A) 4.0 - 5.6 % Final  02/17/2020 01:32 PM 9.4 (A) 4.0 - 5.6 % Final  10/22/2019 12:00 AM 8.4  Final   Hgb A1c MFr Bld  Date/Time Value Ref Range Status  03/23/2021 04:44 AM 7.8 (H) 4.8 - 5.6 % Final    Comment:    (NOTE) Pre diabetes:          5.7%-6.4%  Diabetes:              >6.4%  Glycemic control for   <7.0% adults with diabetes   07/30/2018 01:10 AM 10.4 (H) 4.8 - 5.6 % Final    Comment:    (NOTE) Pre diabetes:          5.7%-6.4% Diabetes:              >6.4% Glycemic control for   <7.0% adults with diabetes     CBG: Recent Labs  Lab 03/24/21 0441 03/24/21 0545 03/24/21 0652 03/24/21 0708 03/24/21 0801  GLUCAP 121* 119* 120* 123* 139*    Review of Systems:     Critical care time: 32 minutes     The patient is critically ill due to respiratory failure, encephalopathy.  Critical care was necessary to treat or prevent imminent or life-threatening deterioration.  Critical care was time spent personally by me on the following activities: development of treatment plan with patient and/or surrogate as well as nursing, discussions with consultants, evaluation of patient's response to treatment, examination of patient, obtaining history from patient or surrogate, ordering and performing treatments and interventions, ordering and review of laboratory studies, ordering and review of radiographic  studies, pulse oximetry, re-evaluation of patient's condition and participation in multidisciplinary rounds.   Critical Care Time devoted to patient care services described in this note is 32 minutes. This time reflects time of care of this [place] . This critical care time does not reflect separately billable procedures or procedure time, teaching time or supervisory time of PA/NP/Med student/Med Resident etc but could involve care discussion time.       Spero Geralds [place] [facility] 03/24/2021 8:02 AM  Pager: see AMION  If no response to pager , please call critical care on call (see AMION) until 7pm After 7:00 pm call Elink

## 2021-03-25 ENCOUNTER — Inpatient Hospital Stay (HOSPITAL_COMMUNITY): Payer: Medicare HMO

## 2021-03-25 DIAGNOSIS — Z794 Long term (current) use of insulin: Secondary | ICD-10-CM

## 2021-03-25 DIAGNOSIS — E785 Hyperlipidemia, unspecified: Secondary | ICD-10-CM

## 2021-03-25 LAB — CBC
HCT: 33.3 % — ABNORMAL LOW (ref 36.0–46.0)
Hemoglobin: 10 g/dL — ABNORMAL LOW (ref 12.0–15.0)
MCH: 24.5 pg — ABNORMAL LOW (ref 26.0–34.0)
MCHC: 30 g/dL (ref 30.0–36.0)
MCV: 81.6 fL (ref 80.0–100.0)
Platelets: 144 10*3/uL — ABNORMAL LOW (ref 150–400)
RBC: 4.08 MIL/uL (ref 3.87–5.11)
RDW: 18.6 % — ABNORMAL HIGH (ref 11.5–15.5)
WBC: 7.7 10*3/uL (ref 4.0–10.5)
nRBC: 0 % (ref 0.0–0.2)

## 2021-03-25 LAB — POCT I-STAT 7, (LYTES, BLD GAS, ICA,H+H)
Acid-Base Excess: 1 mmol/L (ref 0.0–2.0)
Bicarbonate: 23.9 mmol/L (ref 20.0–28.0)
Calcium, Ion: 1.09 mmol/L — ABNORMAL LOW (ref 1.15–1.40)
HCT: 31 % — ABNORMAL LOW (ref 36.0–46.0)
Hemoglobin: 10.5 g/dL — ABNORMAL LOW (ref 12.0–15.0)
O2 Saturation: 91 %
Potassium: 4.5 mmol/L (ref 3.5–5.1)
Sodium: 134 mmol/L — ABNORMAL LOW (ref 135–145)
TCO2: 25 mmol/L (ref 22–32)
pCO2 arterial: 32.8 mmHg (ref 32.0–48.0)
pH, Arterial: 7.47 — ABNORMAL HIGH (ref 7.350–7.450)
pO2, Arterial: 57 mmHg — ABNORMAL LOW (ref 83.0–108.0)

## 2021-03-25 LAB — GLUCOSE, CAPILLARY
Glucose-Capillary: 125 mg/dL — ABNORMAL HIGH (ref 70–99)
Glucose-Capillary: 131 mg/dL — ABNORMAL HIGH (ref 70–99)
Glucose-Capillary: 135 mg/dL — ABNORMAL HIGH (ref 70–99)
Glucose-Capillary: 148 mg/dL — ABNORMAL HIGH (ref 70–99)
Glucose-Capillary: 164 mg/dL — ABNORMAL HIGH (ref 70–99)
Glucose-Capillary: 62 mg/dL — ABNORMAL LOW (ref 70–99)

## 2021-03-25 MED ORDER — MIDAZOLAM HCL 2 MG/2ML IJ SOLN
4.0000 mg | Freq: Once | INTRAMUSCULAR | Status: AC
  Administered 2021-03-25: 14:00:00 2 mg via INTRAVENOUS

## 2021-03-25 MED ORDER — MIDAZOLAM HCL 2 MG/2ML IJ SOLN
1.0000 mg | INTRAMUSCULAR | Status: DC | PRN
  Administered 2021-03-25 (×2): 1 mg via INTRAVENOUS
  Filled 2021-03-25 (×3): qty 2

## 2021-03-25 MED ORDER — ASPIRIN 325 MG PO TABS
162.0000 mg | ORAL_TABLET | Freq: Every day | ORAL | Status: DC
  Administered 2021-03-26 – 2021-03-31 (×6): 162 mg
  Filled 2021-03-25 (×6): qty 1

## 2021-03-25 MED ORDER — ETOMIDATE 2 MG/ML IV SOLN
INTRAVENOUS | Status: AC
  Administered 2021-03-25: 14:00:00 20 mg via INTRAVENOUS
  Filled 2021-03-25: qty 10

## 2021-03-25 MED ORDER — ONDANSETRON HCL 4 MG/2ML IJ SOLN
4.0000 mg | Freq: Four times a day (QID) | INTRAMUSCULAR | Status: DC | PRN
  Administered 2021-03-25 – 2021-03-27 (×2): 4 mg via INTRAVENOUS
  Filled 2021-03-25 (×2): qty 2

## 2021-03-25 MED ORDER — DOCUSATE SODIUM 100 MG PO CAPS: 100.0000 mg | ORAL_CAPSULE | Freq: Two times a day (BID) | ORAL | Status: DC

## 2021-03-25 MED ORDER — MIDAZOLAM HCL 2 MG/2ML IJ SOLN
2.0000 mg | INTRAMUSCULAR | Status: DC | PRN
  Administered 2021-03-25: 20:00:00 2 mg via INTRAVENOUS
  Filled 2021-03-25 (×2): qty 2

## 2021-03-25 MED ORDER — ROCURONIUM BROMIDE 10 MG/ML (PF) SYRINGE
PREFILLED_SYRINGE | INTRAVENOUS | Status: AC
  Administered 2021-03-25: 14:00:00 50 mg via INTRAVENOUS
  Filled 2021-03-25: qty 10

## 2021-03-25 MED ORDER — ETOMIDATE 2 MG/ML IV SOLN: 20.0000 mg | Freq: Once | INTRAVENOUS | Status: AC

## 2021-03-25 MED ORDER — FOLIC ACID 1 MG PO TABS: 1.0000 mg | ORAL_TABLET | Freq: Every day | ORAL | Status: DC

## 2021-03-25 MED ORDER — FENTANYL BOLUS VIA INFUSION
50.0000 ug | INTRAVENOUS | Status: DC | PRN
  Administered 2021-03-25 (×2): 50 ug via INTRAVENOUS
  Administered 2021-03-27: 19:00:00 100 ug via INTRAVENOUS
  Administered 2021-03-27: 18:00:00 50 ug via INTRAVENOUS
  Administered 2021-03-27: 16:00:00 100 ug via INTRAVENOUS
  Administered 2021-03-27: 19:00:00 50 ug via INTRAVENOUS
  Administered 2021-03-28: 04:00:00 100 ug via INTRAVENOUS
  Filled 2021-03-25: qty 100

## 2021-03-25 MED ORDER — POLYETHYLENE GLYCOL 3350 17 G PO PACK
17.0000 g | PACK | Freq: Every day | ORAL | Status: DC
  Administered 2021-03-26 – 2021-03-30 (×3): 17 g
  Filled 2021-03-25 (×2): qty 1

## 2021-03-25 MED ORDER — FOLIC ACID 1 MG PO TABS
1.0000 mg | ORAL_TABLET | Freq: Every day | ORAL | Status: DC
  Administered 2021-03-26 – 2021-03-31 (×6): 1 mg
  Filled 2021-03-25 (×6): qty 1

## 2021-03-25 MED ORDER — DOCUSATE SODIUM 50 MG/5ML PO LIQD
100.0000 mg | Freq: Two times a day (BID) | ORAL | Status: DC
Start: 2021-03-25 — End: 2021-03-31
  Administered 2021-03-25 – 2021-03-30 (×8): 100 mg
  Filled 2021-03-25 (×8): qty 10

## 2021-03-25 MED ORDER — ASPIRIN 325 MG PO TABS: 162.0000 mg | ORAL_TABLET | Freq: Every day | ORAL | Status: DC

## 2021-03-25 MED ORDER — AMLODIPINE BESYLATE 5 MG PO TABS: 5.0000 mg | ORAL_TABLET | Freq: Every day | ORAL | Status: DC

## 2021-03-25 MED ORDER — FENTANYL 2500MCG IN NS 250ML (10MCG/ML) PREMIX INFUSION
50.0000 ug/h | INTRAVENOUS | Status: DC
  Administered 2021-03-26: 19:00:00 100 ug/h via INTRAVENOUS
  Administered 2021-03-26: 200 ug/h via INTRAVENOUS
  Administered 2021-03-27: 06:00:00 50 ug/h via INTRAVENOUS
  Filled 2021-03-25 (×2): qty 250

## 2021-03-25 MED ORDER — ROCURONIUM BROMIDE 10 MG/ML (PF) SYRINGE
50.0000 mg | PREFILLED_SYRINGE | Freq: Once | INTRAVENOUS | Status: AC
  Filled 2021-03-25: qty 5

## 2021-03-25 MED ORDER — POLYETHYLENE GLYCOL 3350 17 G PO PACK: 17.0000 g | PACK | Freq: Every day | ORAL | Status: DC

## 2021-03-25 MED ORDER — PRAVASTATIN SODIUM 40 MG PO TABS
40.0000 mg | ORAL_TABLET | Freq: Every day | ORAL | Status: DC
  Administered 2021-03-25: 19:00:00 40 mg via ORAL
  Filled 2021-03-25: qty 1

## 2021-03-25 MED ORDER — AMLODIPINE BESYLATE 5 MG PO TABS
5.0000 mg | ORAL_TABLET | Freq: Every day | ORAL | Status: DC
  Administered 2021-03-26 – 2021-03-27 (×2): 5 mg
  Filled 2021-03-25 (×3): qty 1

## 2021-03-25 MED ORDER — PRAVASTATIN SODIUM 40 MG PO TABS
40.0000 mg | ORAL_TABLET | Freq: Every day | ORAL | Status: DC
  Administered 2021-03-26 – 2021-03-30 (×5): 40 mg
  Filled 2021-03-25 (×6): qty 1

## 2021-03-25 MED ORDER — FENTANYL CITRATE (PF) 100 MCG/2ML IJ SOLN: 50.0000 ug | Freq: Once | INTRAMUSCULAR | Status: DC

## 2021-03-25 NOTE — Progress Notes (Signed)
ABG results given to Dr. Shearon Stalls. Verbal order received to decrease RR to 12 and increase peep to 8. RT will continue to monitor and be available as needed.

## 2021-03-25 NOTE — Progress Notes (Signed)
NAME:  [name], MRN:  [id number], DOB:  06/08/1970, LOS: 2 ADMISSION DATE:  03/17/2021, CONSULTATION DATE:  12/22 REFERRING MD:  Tyrone Nine, CHIEF COMPLAINT:  AMS/HHS   History of Present Illness:  [name] is a 50 year old lady with PMH significant for type 1 DM on insulin pump, ESRD on HD among multiple medical conditions listed in [place]. Patient presented to the ED with altered mental status.  It was reported that the patient had been last seen around 0930 AM on day of presentation. The patient's sister texted her several times but received no response.  Hence the sister called one of the patient's roommate and asked him to check on her.  She was found confused and incoherent. She was brought to the ED for further evaluation.   In the Ed the patient was found to be severely hyperglycemic with BS > 900. She was initially referred to the hospitalist team for admission. However her mental status worsened  so CCM team was asked to admit her. It was reported that the patient had episodes of nausea and vomiting en route to hospital. The nurses described her as incoherent but by the time PCCM was called to see her, she was unresponsive with episodes of grunting. She could not contribute to this consultation.   The patient's sister [name] who contributed to this consultation by phone reported that the patient has had a similar episode like this in the past.  [name] reported that it took her sister several day before she became coherent.  Pertinent  Medical History  T1DM on insulin pump ESRD on HD T/Th/S Previous DVT Hyperlipidemia HTN Diabetic neuropathy OSA PVD  Significant Hospital Events: Including procedures, antibiotic start and stop dates in addition to other pertinent events   12/22 admitted to ICU, patient intubated and started on insulin drip, had dialysis 12/23 off insulin gtt, still not awake failed SBT   Interim History / Subjective:  No overnight issues. Awakens  off sedation. Passed SBT this morning  Objective   Blood pressure (!) 105/55, pulse (!) 59, temperature 98.2 F (36.8 C), temperature source Oral, resp. rate 16, height 5\' 6"  (1.676 m), weight 64 kg, SpO2 97 %.    Vent Mode: PRVC FiO2 (%):  [30 %-40 %] 30 % Set Rate:  [16 bmp] 16 bmp Vt Set:  [480 mL] 480 mL PEEP:  [5 cmH20] 5 cmH20 Plateau Pressure:  [18 cmH20-22 cmH20] 20 cmH20   Intake/Output Summary (Last 24 hours) at 03/25/2021 0943 Last data filed at 03/25/2021 0700 Gross per 24 hour  Intake 2618.12 ml  Output --  Net 2618.12 ml   Filed Weights   03/23/21 2200 03/24/21 0500 03/25/21 0600  Weight: 60.5 kg 61.7 kg 64 kg    Examination: Gen:      Intubated, sedated, acutely ill appearing HEENT:  ETT to vent Lungs:    sounds of mechanical ventilation auscultated no wheezes or crackles CV:         systolic murmur Abd:      + bowel sounds; soft, non-tender; no palpable masses, no distension Ext:    Mild UE edema Skin:      Warm and dry; no rashes Neuro:   sedated, RASS -1 to +2, moves all 4 extremities, good strength   CBG <180 Hgb 10 Na 134 K 4.2   Resolved Hospital Problem list     Assessment & Plan:  HHS Type 1 diabetes on insulin pump BG >900 on admission, pH  is normal. +BHB. A1c 7.8.  - Continue insulin drip per protocol - stop IVF.  - continue semglee 5u bid  - continue tube feeds  - will transition to insulin pump at discharge or when she is able to manage it.   Acute metabolic encephalopathy 2/2 HHS coma Agitation while on ventilator   CT head negative for acute intracranial pathology.  Suspect metabolic from hyperglycemia with component of derlirum. - continue precedex/fentanyl until extubated  Acute hypoxic respiratory failure 2/2 airway compromise Patient intubated upon admission. Vent settings: PRVC, RR 16, TV 480, PEEP 5, FiO2 75%. Peak presure 23 and plateau 19 - Continue vent support pending improvement in mental status - Wean FiO2 as  tolerated - SBT and extubate per protocol as able - will plan for extubation today after dialysis.   ESRD on HD T/Th/S - monitor electrolytes - Nephrology consulted for HD, TTS through LUE AVF. Plan for HD today.   HTN Patient became hypertensive, frequently during episodes of agitation.  - Amlodipine 5 mg daily - Hydralazine 10 mg q4h prn - Sedation for agitation as above  Hyperlipidemia - Pravastatin 40 mg daily  Anemia Likely secondary to anemia of chronic kidney disease. Hgb stable   Best Practice (right click and "Reselect all SmartList Selections" daily)   Diet/type: tubefeeds DVT prophylaxis: prophylactic heparin  GI prophylaxis: PPI Lines: N/A Foley:  N/A Code Status:  full code Last date of multidisciplinary goals of care discussion [12/22 sister updated over the phone. Will update again today. ]  Labs   CBC: Recent Labs  Lab 03/30/2021 1840 03/12/2021 1919 03/23/21 0128 03/24/21 0231 03/25/21 0148  WBC 12.4*  --  9.9 3.4* 7.7  HGB 11.0* 13.6 10.8* 9.1* 10.0*  HCT 36.1 40.0 35.0* 29.2* 33.3*  MCV 83.0  --  78.8* 78.5* 81.6  PLT 266  --  273 164 144*    Basic Metabolic Panel: Recent Labs  Lab 03/23/21 1225 03/23/21 1902 03/23/21 2141 03/24/21 0231 03/24/21 0852 03/24/21 1226 03/24/21 1653 03/24/21 2022  NA 135 134* 134* 135 133* 132* 134* 134*  K 5.1 4.7 4.4 2.8* 5.0 6.2* 4.0 4.2  CL 95* 93* 95* 98 99 97* 98 98  CO2 26 25 25 29 23 23 25 25   GLUCOSE 100* 123* 99 97 104* 119* 120* 122*  BUN 52* 57* 58* 14 27* 29* 31* 33*  CREATININE 7.15* 7.24* 7.35* 2.28* 4.35* 4.46* 4.67* 4.82*  CALCIUM 8.6* 8.7* 8.6* 7.3* 8.0* 8.1* 8.4* 8.3*  MG 2.1 2.2 2.1 1.8  --   --  2.1  --   PHOS 6.9* 7.2* 7.4* 2.5  --   --  5.6*  --    GFR: Estimated Creatinine Clearance: 13.2 mL/min (A) (by C-G formula based on SCr of 4.82 mg/dL (H)). Recent Labs  Lab 03/21/2021 1840 03/05/2021 1844 03/09/2021 2044 03/23/21 0128 03/24/21 0231 03/25/21 0148  WBC 12.4*  --   --  9.9  3.4* 7.7  LATICACIDVEN  --  2.0* 2.0*  --   --   --     Liver Function Tests: Recent Labs  Lab 03/16/2021 1840  AST 63*  ALT 47*  ALKPHOS 383*  BILITOT 1.1  PROT 7.7  ALBUMIN 3.5   No results for input(s): LIPASE, AMYLASE in the last 168 hours. No results for input(s): AMMONIA in the last 168 hours.  ABG    Component Value Date/Time   PHART 7.351 03/23/2021 0109   PCO2ART 48.8 (H) 03/23/2021 0109   PO2ART  59.5 (L) 03/23/2021 0109   HCO3 26.1 03/23/2021 0109   TCO2 29 03/19/2021 1919   ACIDBASEDEF 7.0 (H) 11/21/2020 0550   O2SAT 85.3 03/23/2021 0109     Coagulation Profile: No results for input(s): INR, PROTIME in the last 168 hours.  Cardiac Enzymes: No results for input(s): CKTOTAL, CKMB, CKMBINDEX, TROPONINI in the last 168 hours.  HbA1C: Hemoglobin A1C  Date/Time Value Ref Range Status  11/02/2020 10:26 AM 8.1 (A) 4.0 - 5.6 % Final  02/17/2020 01:32 PM 9.4 (A) 4.0 - 5.6 % Final  10/22/2019 12:00 AM 8.4  Final   Hgb A1c MFr Bld  Date/Time Value Ref Range Status  03/23/2021 04:44 AM 7.8 (H) 4.8 - 5.6 % Final    Comment:    (NOTE) Pre diabetes:          5.7%-6.4%  Diabetes:              >6.4%  Glycemic control for   <7.0% adults with diabetes   07/30/2018 01:10 AM 10.4 (H) 4.8 - 5.6 % Final    Comment:    (NOTE) Pre diabetes:          5.7%-6.4% Diabetes:              >6.4% Glycemic control for   <7.0% adults with diabetes     CBG: Recent Labs  Lab 03/24/21 1504 03/24/21 1920 03/24/21 2334 03/25/21 0304 03/25/21 0705  GLUCAP 142* 107* 111* 135* 125*    Critical care time: 33 minutes    The patient is critically ill due to respiratory failure, encephalopathy.  Critical care was necessary to treat or prevent imminent or life-threatening deterioration.  Critical care was time spent personally by me on the following activities: development of treatment plan with patient and/or surrogate as well as nursing, discussions with consultants,  evaluation of patient's response to treatment, examination of patient, obtaining history from patient or surrogate, ordering and performing treatments and interventions, ordering and review of laboratory studies, ordering and review of radiographic studies, pulse oximetry, re-evaluation of patient's condition and participation in multidisciplinary rounds.   Critical Care Time devoted to patient care services described in this note is 33 minutes. This time reflects time of care of this [place] . This critical care time does not reflect separately billable procedures or procedure time, teaching time or supervisory time of PA/NP/Med student/Med Resident etc but could involve care discussion time.       Spero Geralds [place] [facility] 03/25/2021 9:43 AM  Pager: see AMION  If no response to pager , please call critical care on call (see AMION) until 7pm After 7:00 pm call Elink

## 2021-03-25 NOTE — Progress Notes (Signed)
[place] Progress Note Patient Name: [name] DOB: October 14, 1970 MRN: [id number]   Date of Service  03/25/2021  HPI/Events of Note  Patient needed re intubation earlier and is on max dose of precedex and fentanyl infusing at 200 microgram / hour but is agitated  eICU Interventions  Increase fentanyl to 250 mic/hour Add PRN versed Seen on camera. O2 sat is 100 on 40%/peep 8 and will try and use only PRN versed     Intervention Category Major Interventions: Respiratory failure - evaluation and management  Margaretmary Lombard 03/25/2021, 7:44 PM

## 2021-03-25 NOTE — Progress Notes (Signed)
PT Cancellation Note  Patient Details Name: [name] MRN: [id number] DOB: 06-Jan-1971   Cancelled Treatment:    Reason Eval/Treat Not Completed: Patient not medically ready  Patient extubated. Per RN, she is not tolerating and may be reintubated. Dropping sats, periods of apnea. PT not yet appropriate. Will follow along   [place]  Pager [phone] Office [phone]   Rexanne Mano 03/25/2021, 1:30 PM

## 2021-03-25 NOTE — Progress Notes (Addendum)
Progress notes:  Patient seen and evaluated over the course of the morning. Discussed plan of care extensively with RT, nursing, patient's sister at bedside. She is having prolonged periods of apnea suspicious for cheynes stokes respirations with desaturations. Hoarse voice and intermittent stridor. She is awake and can follow commands but this current respiratory status is not sustainable. Decision was made to reinubate. Patients sister was updated by myself on plan of care. I suspect she is volume overloaded and has underlying sleep disordered breathing that is untreated. Hopefully she can be extubated after dialysis.  Additional CC time 45 minutes.

## 2021-03-25 NOTE — Progress Notes (Signed)
OT Cancellation Note  Patient Details Name: [name] MRN: [id number] DOB: 1970/06/23   Cancelled Treatment:    Reason Eval/Treat Not Completed: Patient not medically ready.  Patient intubated.  Continue efforts as appropriate.    [place] Sarinana D Arshia Spellman 03/25/2021, 3:19 PM

## 2021-03-25 NOTE — Progress Notes (Signed)
[place] Progress Note Patient Name: [name] DOB: [birth year] MRN: [id number]   Date of Service  03/25/2021  HPI/Events of Note  Pt having break through agitation on Prec 1.2 and Fentanyl 200 and PRN bolus 30 mcg. Change Prop to prec and D/C'd Ativan PRN yesterday.  Currently seen adequately sedated  eICU Interventions  Versed 1 mg q 4 prn ordered     Intervention Category Minor Interventions: Agitation / anxiety - evaluation and management  Judd Lien 03/25/2021, 4:10 AM

## 2021-03-25 NOTE — Progress Notes (Signed)
[facility] Progress Note    Assessment/ Plan:    Hyperosmolar Hyperglycemia Syndrome - s/p insulin gtt. Per primary service  Acute metabolic encephalopathy - due to HHS coma.  S/p intubation and vent settings per PCCM  ESRD -  Will maintain TTS schedule, HD today  Hypertension/volume  - low BP's but MAP stable, will try to UF as tolerated  Anemia  - stable, continue with ESA  Metabolic bone disease -  npo for now  Nutrition - npo for now  Outpatient Dialysis Orders: Center: [facility]  on TTS . EDW 57.5 kg HD Bath 2K/2Ca  Time 4 hours Heparin none. Access LUE AVF BFR 400 DFR 500    Hectoral 11 mcg IV/HD Micera 225 mcg every 2 weeks (last given 03/09/21 Venofer  50 mg IV every Tuesday  [name], MD [facility]    Subjective:   Breakthrough agitation overnight. Possible extubation later today   Objective:   BP (!) 105/55 (BP Location: Right Leg)    Pulse (!) 59    Temp 98.2 F (36.8 C) (Oral)    Resp 16    Ht _0  (1.676 m)    Wt 64 kg    SpO2 97%    BMI 22.77 kg/m   Intake/Output Summary (Last 24 hours) at 03/25/2021 5329 Last data filed at 03/25/2021 0700 Gross per 24 hour  Intake 2618.12 ml  Output --  Net 2618.12 ml   Weight change: 3.5 kg  Physical Exam: Gen: agitated CVS:s1s2, rrr Resp:intubated, cta bl JME:QAST Ext: b/l UE edema, +dependent edema b/l thighs Neuro: awake/agitated/not following commands Dialysis access: LUE AVF +b/t  Imaging: No results found.  Labs: BMET Recent Labs  Lab 03/23/21 0444 03/23/21 0923 03/23/21 1225 03/23/21 1902 03/23/21 2141 03/24/21 0231 03/24/21 0852 03/24/21 1226 03/24/21 1653 03/24/21 2022  NA 132* 135 135 134* 134* 135 133* 132* 134* 134*  K 4.8 5.2* 5.1 4.7 4.4 2.8* 5.0 6.2* 4.0 4.2  CL 92* 94* 95* 93* 95* 98 99 97* 98 98  CO_1 25  GLUCOSE 186* 123* 100* 123* 99 97 104* 119* 120* 122*  BUN 50* 53* 52* 57* 58* 14 27* 29* 31* 33*  CREATININE 6.86*  7.02* 7.15* 7.24* 7.35* 2.28* 4.35* 4.46* 4.67* 4.82*  CALCIUM 8.5* 8.5* 8.6* 8.7* 8.6* 7.3* 8.0* 8.1* 8.4* 8.3*  PHOS 7.2* 6.9* 6.9* 7.2* 7.4* 2.5  --   --  5.6*  --    CBC Recent Labs  Lab 03/23/2021 1840 03/29/2021 1919 03/23/21 0128 03/24/21 0231 03/25/21 0148  WBC 12.4*  --  9.9 3.4* 7.7  HGB 11.0* 13.6 10.8* 9.1* 10.0*  HCT 36.1 40.0 35.0* 29.2* 33.3*  MCV 83.0  --  78.8* 78.5* 81.6  PLT 266  --  273 164 144*    Medications:     amLODipine  5 mg Per Tube Daily   aspirin  162 mg Per Tube Daily   chlorhexidine gluconate (MEDLINE KIT)  15 mL Mouth Rinse BID   Chlorhexidine Gluconate Cloth  6 each Topical Q0600   Chlorhexidine Gluconate Cloth  6 each Topical Q0600   collagenase   Topical Daily   darbepoetin (ARANESP) injection - DIALYSIS  40 mcg Intravenous Q Thu-HD   docusate  100 mg Per Tube BID   folic acid  1 mg Per Tube Daily   heparin  5,000 Units Subcutaneous Q8H   insulin aspart  0-9 Units Subcutaneous Q4H  insulin glargine-yfgn  5 Units Subcutaneous BID   mouth rinse  15 mL Mouth Rinse 10 times per day   pantoprazole (PROTONIX) IV  40 mg Intravenous Q24H   polyethylene glycol  17 g Per Tube Daily   pravastatin  40 mg Per Tube q1800      [name], MD [facility] 03/25/2021, 9:04 AM

## 2021-03-25 NOTE — Procedures (Signed)
Intubation Procedure Note  [name]  [id number]  10-Jun-1970  Date:03/25/21  Time:2:04 PM   Provider Performing:Tamika Shropshire Mauricio Po    Procedure: Intubation (93570)  Indication(s) Respiratory Failure  Consent Unable to obtain consent due to emergent nature of procedure.   Anesthesia Etomidate, Versed, and Rocuronium   Time Out Verified patient identification, verified procedure, site/side was marked, verified correct patient position, special equipment/implants available, medications/allergies/relevant history reviewed, required imaging and test results available.   Sterile Technique Usual hand hygeine, masks, and gloves were used   Procedure Description Patient positioned in bed supine.  Sedation given as noted above.  Patient was intubated with endotracheal tube using  direct laryngoscopy .  View was Grade 2 only posterior commissure .  Number of attempts was 1.  Colorimetric CO2 detector was consistent with tracheal placement.   Complications/Tolerance None; patient tolerated the procedure well. Chest X-ray is ordered to verify placement.   EBL Minimal   Specimen(s) None\  Lenice Llamas, MD [facility] [place] 03/25/2021 2:05 PM Pager: see AMION  If no response to pager, please call critical care on call (see AMION) until 7pm After 7:00 pm call Elink

## 2021-03-25 NOTE — Procedures (Signed)
Extubation Procedure Note  Patient Details:   Name: [name] DOB: May 30, 1970 MRN: [id number]   Airway Documentation:    Vent end date: 03/25/21 Vent end time: 0955   Evaluation  O2 sats: stable throughout Complications: No apparent complications Patient did tolerate procedure well. Bilateral Breath Sounds: Clear, Diminished   Yes  Pt extubated per physician order. Pt suctioned via ETT and orally prior. Positive cuff leak. Pt extubated to 3L nasal cannula which had to be increased to 6L for desaturation. Pt able to speak name, give a good cough and no stridor heard at this time. RT will continue to monitor and be available as needed.   Sharla Kidney 03/25/2021, 9:59 AM

## 2021-03-26 ENCOUNTER — Inpatient Hospital Stay (HOSPITAL_COMMUNITY): Payer: Medicare HMO

## 2021-03-26 DIAGNOSIS — E1010 Type 1 diabetes mellitus with ketoacidosis without coma: Secondary | ICD-10-CM

## 2021-03-26 DIAGNOSIS — J810 Acute pulmonary edema: Secondary | ICD-10-CM

## 2021-03-26 LAB — BASIC METABOLIC PANEL
Anion gap: 11 (ref 5–15)
Anion gap: 13 (ref 5–15)
BUN: 9 mg/dL (ref 6–20)
BUN: 28 mg/dL — ABNORMAL HIGH (ref 6–20)
CO2: 27 mmol/L (ref 22–32)
CO2: 24 mmol/L (ref 22–32)
Calcium: 6.9 mg/dL — ABNORMAL LOW (ref 8.9–10.3)
Calcium: 8.1 mg/dL — ABNORMAL LOW (ref 8.9–10.3)
Chloride: 93 mmol/L — ABNORMAL LOW (ref 98–111)
Chloride: 96 mmol/L — ABNORMAL LOW (ref 98–111)
Creatinine, Ser: 1.72 mg/dL — ABNORMAL HIGH (ref 0.44–1.00)
Creatinine, Ser: 4.48 mg/dL — ABNORMAL HIGH (ref 0.44–1.00)
GFR, Estimated: 36 mL/min — ABNORMAL LOW (ref >60)
GFR, Estimated: 11 mL/min — ABNORMAL LOW (ref >60)
Glucose, Bld: 86 mg/dL (ref 70–99)
Glucose, Bld: 279 mg/dL — ABNORMAL HIGH (ref 70–99)
Potassium: 2.6 mmol/L — CL (ref 3.5–5.1)
Potassium: 5 mmol/L (ref 3.5–5.1)
Sodium: 131 mmol/L — ABNORMAL LOW (ref 135–145)
Sodium: 133 mmol/L — ABNORMAL LOW (ref 135–145)

## 2021-03-26 LAB — POCT I-STAT 7, (LYTES, BLD GAS, ICA,H+H)
Acid-base deficit: 9 mmol/L — ABNORMAL HIGH (ref 0.0–2.0)
Bicarbonate: 18.2 mmol/L — ABNORMAL LOW (ref 20.0–28.0)
Calcium, Ion: 1.09 mmol/L — ABNORMAL LOW (ref 1.15–1.40)
HCT: 34 % — ABNORMAL LOW (ref 36.0–46.0)
Hemoglobin: 11.6 g/dL — ABNORMAL LOW (ref 12.0–15.0)
O2 Saturation: 84 %
Patient temperature: 98.1
Potassium: 5.4 mmol/L — ABNORMAL HIGH (ref 3.5–5.1)
Sodium: 126 mmol/L — ABNORMAL LOW (ref 135–145)
TCO2: 19 mmol/L — ABNORMAL LOW (ref 22–32)
pCO2 arterial: 41.9 mmHg (ref 32.0–48.0)
pH, Arterial: 7.245 — ABNORMAL LOW (ref 7.350–7.450)
pO2, Arterial: 56 mmHg — ABNORMAL LOW (ref 83.0–108.0)

## 2021-03-26 LAB — CBC
HCT: 28.4 % — ABNORMAL LOW (ref 36.0–46.0)
Hemoglobin: 8.6 g/dL — ABNORMAL LOW (ref 12.0–15.0)
MCH: 25 pg — ABNORMAL LOW (ref 26.0–34.0)
MCHC: 30.3 g/dL (ref 30.0–36.0)
MCV: 82.6 fL (ref 80.0–100.0)
Platelets: 231 10*3/uL (ref 150–400)
RBC: 3.44 MIL/uL — ABNORMAL LOW (ref 3.87–5.11)
RDW: 18.4 % — ABNORMAL HIGH (ref 11.5–15.5)
WBC: 6.8 10*3/uL (ref 4.0–10.5)
nRBC: 0 % (ref 0.0–0.2)

## 2021-03-26 LAB — GLUCOSE, CAPILLARY
Glucose-Capillary: 107 mg/dL — ABNORMAL HIGH (ref 70–99)
Glucose-Capillary: 125 mg/dL — ABNORMAL HIGH (ref 70–99)
Glucose-Capillary: 227 mg/dL — ABNORMAL HIGH (ref 70–99)
Glucose-Capillary: 317 mg/dL — ABNORMAL HIGH (ref 70–99)
Glucose-Capillary: 366 mg/dL — ABNORMAL HIGH (ref 70–99)
Glucose-Capillary: 419 mg/dL — ABNORMAL HIGH (ref 70–99)
Glucose-Capillary: 504 mg/dL (ref 70–99)
Glucose-Capillary: 70 mg/dL (ref 70–99)

## 2021-03-26 MED ORDER — INSULIN DETEMIR 100 UNIT/ML ~~LOC~~ SOLN
10.0000 [IU] | Freq: Two times a day (BID) | SUBCUTANEOUS | Status: DC
  Administered 2021-03-26: 22:00:00 10 [IU] via SUBCUTANEOUS
  Filled 2021-03-26 (×3): qty 0.1

## 2021-03-26 MED ORDER — INSULIN ASPART 100 UNIT/ML IJ SOLN: 0.0000 [IU] | Freq: Three times a day (TID) | INTRAMUSCULAR | Status: DC

## 2021-03-26 MED ORDER — IOHEXOL 350 MG/ML SOLN
75.0000 mL | Freq: Once | INTRAVENOUS | Status: AC | PRN
  Administered 2021-03-26: 21:00:00 75 mL via INTRAVENOUS

## 2021-03-26 MED ORDER — DEXTROSE 50 % IV SOLN
12.5000 g | INTRAVENOUS | Status: AC
  Administered 2021-03-26: 12.5 g via INTRAVENOUS

## 2021-03-26 MED ORDER — INSULIN ASPART 100 UNIT/ML IJ SOLN
0.0000 [IU] | INTRAMUSCULAR | Status: DC
  Administered 2021-03-26 – 2021-03-27 (×2): 15 [IU] via SUBCUTANEOUS

## 2021-03-26 NOTE — Progress Notes (Signed)
[facility] Progress Note    Assessment/ Plan:    Hyperosmolar Hyperglycemia Syndrome - s/p insulin gtt. Per primary service  Acute metabolic encephalopathy - due to HHS coma.  S/p intubation and vent settings per PCCM  ESRD -  Will maintain TTS schedule, tentatively planning for next HD on Tues 12/27 Hyponatremia: managing with HD, 137Na bath Hypokalemia: replete carefully, can given 7meq Kcl if needed  Hypertension/volume  - UF as tolerated  Anemia  - stable, continue with ESA  Metabolic bone disease -  npo for now  Nutrition - npo for now  Outpatient Dialysis Orders: Center: [facility]  on TTS . EDW 57.5 kg HD Bath 2K/2Ca  Time 4 hours Heparin none. Access LUE AVF BFR 400 DFR 500    Hectoral 11 mcg IV/HD Micera 225 mcg every 2 weeks (last given 03/09/21 Venofer  50 mg IV every Tuesday  Nancy Quint, MD [facility]    Subjective:   Extubated and then reintubated yesterday due to apneic episodes. Tolerated HD yesterday with net UF 2.5L.   Objective:   BP (!) 130/46    Pulse (!) 55    Temp (!) 97.4 F (36.3 C) (Axillary)    Resp 12    Ht $R'5\' 6"'ZY$  (1.676 m)    Wt 60 kg    SpO2 100%    BMI 21.35 kg/m   Intake/Output Summary (Last 24 hours) at 03/26/2021 0739 Last data filed at 03/26/2021 0600 Gross per 24 hour  Intake 680.14 ml  Output 2650 ml  Net -1969.86 ml   Weight change: -4 kg  Physical Exam: Gen: sedated CVS:s1s2, rrr Resp:intubated, cta bl RDE:YCXK Ext: b/l UE edema, +dependent edema b/l thighs Neuro: sedated Dialysis access: LUE AVF +b/t  Imaging: DG CHEST PORT 1 VIEW  Result Date: 03/25/2021 CLINICAL DATA:  Respiratory failure, ventilatory support EXAM: PORTABLE CHEST 1 VIEW COMPARISON:  03/23/2021 FINDINGS: Endotracheal tube 4.7 cm above the carina. NG tube extends below the hemidiaphragms into the stomach. Stomach remains air distended. Stable cardiomegaly with slightly lower lung volumes and bibasilar atelectasis, worse on the  left. No enlarging effusion. No pneumothorax. IMPRESSION: Stable cardiomegaly. Lower lung volumes with increased basilar atelectasis. Electronically Signed   By: Jerilynn Mages.  Shick M.D.   On: 03/25/2021 14:15   DG Abd Portable 1V  Result Date: 03/26/2021 CLINICAL DATA:  Check gastric catheter placement EXAM: PORTABLE ABDOMEN - 1 VIEW COMPARISON:  None. FINDINGS: Gastric catheter is noted within the stomach. No free air is seen. No obstructive changes are noted. IMPRESSION: Gastric catheter within the stomach. Electronically Signed   By: Inez Catalina M.D.   On: 03/26/2021 01:37    Labs: BMET Recent Labs  Lab 03/23/21 0444 03/23/21 0923 03/23/21 1225 03/23/21 1902 03/23/21 2141 03/24/21 0231 03/24/21 0852 03/24/21 1226 03/24/21 1653 03/24/21 2022 03/25/21 1717 03/26/21 0140  NA 132* 135 135 134* 134* 135 133* 132* 134* 134* 134* 131*  K 4.8 5.2* 5.1 4.7 4.4 2.8* 5.0 6.2* 4.0 4.2 4.5 2.6*  CL 92* 94* 95* 93* 95* 98 99 97* 98 98  --  93*  CO$Remo'2 24 27 26 25 25 29 23 23 25 'QOfNE$ 25  --  27  GLUCOSE 186* 123* 100* 123* 99 97 104* 119* 120* 122*  --  86  BUN 50* 53* 52* 57* 58* 14 27* 29* 31* 33*  --  9  CREATININE 6.86* 7.02* 7.15* 7.24* 7.35* 2.28* 4.35* 4.46* 4.67* 4.82*  --  1.72*  CALCIUM 8.5* 8.5*  8.6* 8.7* 8.6* 7.3* 8.0* 8.1* 8.4* 8.3*  --  6.9*  PHOS 7.2* 6.9* 6.9* 7.2* 7.4* 2.5  --   --  5.6*  --   --   --    CBC Recent Labs  Lab 03/23/21 0128 03/24/21 0231 03/25/21 0148 03/25/21 1717 03/26/21 0140  WBC 9.9 3.4* 7.7  --  6.8  HGB 10.8* 9.1* 10.0* 10.5* 8.6*  HCT 35.0* 29.2* 33.3* 31.0* 28.4*  MCV 78.8* 78.5* 81.6  --  82.6  PLT 273 164 144*  --  231    Medications:     amLODipine  5 mg Per Tube Daily   aspirin  162 mg Per Tube Daily   chlorhexidine gluconate (MEDLINE KIT)  15 mL Mouth Rinse BID   Chlorhexidine Gluconate Cloth  6 each Topical Q0600   collagenase   Topical Daily   darbepoetin (ARANESP) injection - DIALYSIS  40 mcg Intravenous Q Thu-HD   docusate  100 mg Per Tube  BID   fentaNYL (SUBLIMAZE) injection  50 mcg Intravenous Once   folic acid  1 mg Per Tube Daily   heparin  5,000 Units Subcutaneous Q8H   insulin aspart  0-9 Units Subcutaneous Q4H   mouth rinse  15 mL Mouth Rinse 10 times per day   polyethylene glycol  17 g Per Tube Daily   pravastatin  40 mg Per Tube q1800      Nancy Quint, MD [facility] 03/26/2021, 7:39 AM

## 2021-03-26 NOTE — Progress Notes (Addendum)
NAME:  [name], MRN:  [id number], DOB:  [birth year], LOS: 3 ADMISSION DATE:  03/08/2021, CONSULTATION DATE:  12/22 REFERRING MD:  Tyrone Nine, CHIEF COMPLAINT:  AMS/HHS   History of Present Illness:  [name] is a 50 year old lady with PMH significant for type 1 DM on insulin pump, ESRD on HD among multiple medical conditions listed in [place]. Patient presented to the ED with altered mental status.  It was reported that the patient had been last seen around 0930 AM on day of presentation. The patient's sister texted her several times but received no response.  Hence the sister called one of the patient's roommate and asked him to check on her.  She was found confused and incoherent. She was brought to the ED for further evaluation.   In the Ed the patient was found to be severely hyperglycemic with BS > 900. She was initially referred to the hospitalist team for admission. However her mental status worsened  so CCM team was asked to admit her. It was reported that the patient had episodes of nausea and vomiting en route to hospital. The nurses described her as incoherent but by the time PCCM was called to see her, she was unresponsive with episodes of grunting. She could not contribute to this consultation.   The patient's sister [name] who contributed to this consultation by phone reported that the patient has had a similar episode like this in the past.  [name] reported that it took her sister several day before she became coherent.  Pertinent  Medical History  T1DM on insulin pump ESRD on HD T/Th/S Previous DVT Hyperlipidemia HTN Diabetic neuropathy OSA PVD  Significant Hospital Events: Including procedures, antibiotic start and stop dates in addition to other pertinent events   12/22 admitted to ICU, patient intubated and started on insulin drip, had dialysis 12/23 off insulin gtt, still not awake failed SBT 12/24 extubated and required reinbuation due to cheynes stokes  respirations  Interim History / Subjective:  Required reintubation yesterday morning. 2.5L off in HD. Still net positive from all the fluids she received for her HHS.   Objective   Blood pressure (!) 130/46, pulse (!) 55, temperature 98.2 F (36.8 C), temperature source Oral, resp. rate 12, height 5\' 6"  (1.676 m), weight 60 kg, SpO2 100 %.    Vent Mode: PRVC FiO2 (%):  [40 %-60 %] 40 % Set Rate:  [12 bmp-16 bmp] 12 bmp Vt Set:  [480 mL] 480 mL PEEP:  [5 cmH20-8 cmH20] 5 cmH20 Plateau Pressure:  [15 cmH20-21 cmH20] 18 cmH20   Intake/Output Summary (Last 24 hours) at 03/26/2021 1012 Last data filed at 03/26/2021 0600 Gross per 24 hour  Intake 620.34 ml  Output 2650 ml  Net -2029.66 ml   Filed Weights   03/24/21 0500 03/25/21 0600 03/26/21 0500  Weight: 61.7 kg 64 kg 60 kg    Examination: Gen:      Intubated, sedated, acutely ill appearing HEENT:  ETT to vent Lungs:    sounds of mechanical ventilation auscultated coarse rhonchi no wheezes CV:         RRR no mrg Abd:      + bowel sounds; soft, non-tender; no palpable masses, no distension Ext:    Diffuse edematous, LUE AVF +thrill and bruit Skin:      Warm and dry; no rashes Neuro:   sedated, RASS -2, moves all 4 extremities  Labs/imaging reviewed: CBGs 227 this morning, as low as 70 BMET  pending   Resolved Hospital Problem list     Assessment & Plan:   Acute hypoxic respiratory failure 2/2 airway compromise - initially intubated for encephalopathy, now with acute pulmonary edema from fluid resuscitation - Continue vent support pending improvement in mental status - failed extubation attempt 12/24 due to pulmonary edema, cheynes stokes, respirations' - would wait one more dialysis session with volume removal before attempting re-extubation.   HHS Type 1 diabetes on insulin pump - HHS resolved - continue semglee 5u bid, sensitive SS due to potential for hypoglycemia - resume tube feeds  - will transition to  insulin pump at discharge or when she is able to manage it.   Acute metabolic encephalopathy 2/2 HHS coma Agitation while on ventilator   CT head negative for acute intracranial pathology.  Suspect metabolic from hyperglycemia with component of derlirum. - continue precedex/fentanyl until extubated \ ESRD on HD T/Th/S - monitor electrolytes - Nephrology consulted for HD, TTS through LUE AVF. - last day of Hd was 12/24 evening. Will see if she can get one more session with volume removal before extubating.   HTN Patient became hypertensive, frequently during episodes of agitation.  - Amlodipine 5 mg daily - Hydralazine 10 mg q4h prn - Sedation for agitation as above  Hyperlipidemia - Pravastatin 40 mg daily  Anemia Likely secondary to anemia of chronic kidney disease. Hgb stable   Best Practice (right click and "Reselect all SmartList Selections" daily)   Diet/type: tubefeeds DVT prophylaxis: prophylactic heparin  GI prophylaxis: PPI Lines: N/A Foley:  N/A Code Status:  full code Last date of multidisciplinary goals of care discussion [10/17 sister [name] updated at bedside]  Labs   CBC: Recent Labs  Lab 03/17/2021 1840 03/17/2021 1919 03/23/21 0128 03/24/21 0231 03/25/21 0148 03/25/21 1717 03/26/21 0140  WBC 12.4*  --  9.9 3.4* 7.7  --  6.8  HGB 11.0*   < > 10.8* 9.1* 10.0* 10.5* 8.6*  HCT 36.1   < > 35.0* 29.2* 33.3* 31.0* 28.4*  MCV 83.0  --  78.8* 78.5* 81.6  --  82.6  PLT 266  --  273 164 144*  --  231   < > = values in this interval not displayed.    Basic Metabolic Panel: Recent Labs  Lab 03/23/21 1225 03/23/21 1902 03/23/21 2141 03/24/21 0231 03/24/21 0852 03/24/21 1226 03/24/21 1653 03/24/21 2022 03/25/21 1717 03/26/21 0140  NA 135 134* 134* 135 133* 132* 134* 134* 134* 131*  K 5.1 4.7 4.4 2.8* 5.0 6.2* 4.0 4.2 4.5 2.6*  CL 95* 93* 95* 98 99 97* 98 98  --  93*  CO2 26 25 25 29 23 23 25 25   --  27  GLUCOSE 100* 123* 99 97 104* 119* 120* 122*   --  86  BUN 52* 57* 58* 14 27* 29* 31* 33*  --  9  CREATININE 7.15* 7.24* 7.35* 2.28* 4.35* 4.46* 4.67* 4.82*  --  1.72*  CALCIUM 8.6* 8.7* 8.6* 7.3* 8.0* 8.1* 8.4* 8.3*  --  6.9*  MG 2.1 2.2 2.1 1.8  --   --  2.1  --   --   --   PHOS 6.9* 7.2* 7.4* 2.5  --   --  5.6*  --   --   --    GFR: Estimated Creatinine Clearance: 37 mL/min (A) (by C-G formula based on SCr of 1.72 mg/dL (H)). Recent Labs  Lab 03/15/2021 1844 04/01/2021 2044 03/23/21 0128 03/24/21 0231 03/25/21 0148 03/26/21  0140  WBC  --   --  9.9 3.4* 7.7 6.8  LATICACIDVEN 2.0* 2.0*  --   --   --   --     Liver Function Tests: Recent Labs  Lab 03/07/2021 1840  AST 63*  ALT 47*  ALKPHOS 383*  BILITOT 1.1  PROT 7.7  ALBUMIN 3.5   No results for input(s): LIPASE, AMYLASE in the last 168 hours. No results for input(s): AMMONIA in the last 168 hours.  ABG    Component Value Date/Time   PHART 7.470 (H) 03/25/2021 1717   PCO2ART 32.8 03/25/2021 1717   PO2ART 57 (L) 03/25/2021 1717   HCO3 23.9 03/25/2021 1717   TCO2 25 03/25/2021 1717   ACIDBASEDEF 7.0 (H) 11/21/2020 0550   O2SAT 91.0 03/25/2021 1717     Coagulation Profile: No results for input(s): INR, PROTIME in the last 168 hours.  Cardiac Enzymes: No results for input(s): CKTOTAL, CKMB, CKMBINDEX, TROPONINI in the last 168 hours.  HbA1C: Hemoglobin A1C  Date/Time Value Ref Range Status  11/02/2020 10:26 AM 8.1 (A) 4.0 - 5.6 % Final  02/17/2020 01:32 PM 9.4 (A) 4.0 - 5.6 % Final  10/22/2019 12:00 AM 8.4  Final   Hgb A1c MFr Bld  Date/Time Value Ref Range Status  03/23/2021 04:44 AM 7.8 (H) 4.8 - 5.6 % Final    Comment:    (NOTE) Pre diabetes:          5.7%-6.4%  Diabetes:              >6.4%  Glycemic control for   <7.0% adults with diabetes   07/30/2018 01:10 AM 10.4 (H) 4.8 - 5.6 % Final    Comment:    (NOTE) Pre diabetes:          5.7%-6.4% Diabetes:              >6.4% Glycemic control for   <7.0% adults with diabetes     CBG: Recent  Labs  Lab 03/25/21 2350 03/26/21 0021 03/26/21 0318 03/26/21 0449 03/26/21 0816  GLUCAP 62* 107* 70 125* 227*    Critical care time: 34 minutes    The patient is critically ill due to respiratory failure.  Critical care was necessary to treat or prevent imminent or life-threatening deterioration.  Critical care was time spent personally by me on the following activities: development of treatment plan with patient and/or surrogate as well as nursing, discussions with consultants, evaluation of patient's response to treatment, examination of patient, obtaining history from patient or surrogate, ordering and performing treatments and interventions, ordering and review of laboratory studies, ordering and review of radiographic studies, pulse oximetry, re-evaluation of patient's condition and participation in multidisciplinary rounds.   Critical Care Time devoted to patient care services described in this note is 34 minutes. This time reflects time of care of this [place] . This critical care time does not reflect separately billable procedures or procedure time, teaching time or supervisory time of PA/NP/Med student/Med Resident etc but could involve care discussion time.       Spero Geralds [place] [facility] 03/26/2021 10:13 AM  Pager: see AMION  If no response to pager , please call critical care on call (see AMION) until 7pm After 7:00 pm call Elink

## 2021-03-26 NOTE — Progress Notes (Signed)
[facility] Progress Note Patient Name: [name] DOB: 1970-12-11 MRN: [id number]   Date of Service  03/26/2021  HPI/Events of Note  Notified that glucose > 400s. Now on tube feeding and is tolerating. Had hypoglycemic episodes yesterday off tube feeding  eICU Interventions  Added Levemir 10 BID and increased SSI to moderate scale May need to hold basal insulin if tube feeding is interrupted for whatever reason     Intervention Category Intermediate Interventions: Hyperglycemia - evaluation and treatment  Shona Needles Malkia Nippert 03/26/2021, 8:04 PM

## 2021-03-26 NOTE — Progress Notes (Signed)
°  Transition of Care [facility]) Screening Note   Patient Details  Name: [name] Date of Birth: 09/09/1970   Transition of Care [facility]) CM/SW Contact:    Bartholomew Crews, RN Phone Number: [phone] 03/26/2021, 10:55 AM    Transition of Care Department [facility]) has reviewed patient and no TOC needs have been identified at this time. We will continue to monitor patient advancement through interdisciplinary progression rounds. If new patient transition needs arise, please place a TOC consult.  Noted that patient is pending PT and OT evals once medically stable. ESRD on HD TTS.

## 2021-03-26 NOTE — Code Documentation (Signed)
Code Stroke initiated on pt at 2057 d/t B extremity paralysis, L sided gaze, and B hemianopia. LSN unclear but charted at 0800 that pt was moving all extremities purposefully,  that pupils were reactive, and no gaze preference was charted. CBG-419, NIH-32, CT-negative for acute changes. Delay in CTA scan d/t need for additional IV access. IV team able to place PIV with U/S while in CT. CTA-no LVO. Plan EEG.

## 2021-03-26 NOTE — Consult Note (Signed)
NEUROLOGY CONSULTATION NOTE   Date of service: March 26, 2021 Patient Name: [name] MRN:  [id number] DOB:  1971/01/06 Reason for consult: "Stroke code for L gaze deviation and unresponsive" Requesting Provider: Spero Geralds, MD _ _ _   _ __   _ __ _ _  __ __   _ __   __ _  History of Present Illness  [name] is a 50 y.o. female with hx of Type 1DM, ESRD on HD, prior DVTs, HLD, HTN, OSA, PVD who is admitted with poor responsiveness in the setting of significant hyperglycemia.  She was noted to have L gaze deviation and unresponsive and thus a code stroke was activated with a LKW of 0800. She was noted to have reactive pupils at 0800 with no gaze deviation documented.  Prior CTH from 12/22 with no acute intracranial abnormalities.  On my evaluation, pupils 36mm BL, unreactive.  mRS: unclear. tNKAse: not offered, outside window Thrombectomy: not offered    ROS   Unable to obtain due to unresponsiveness.  Past History   Past Medical History:  Diagnosis Date   Anemia    Arthritis    Bronchitis    C. difficile diarrhea 09/26/2014   Cataracts, both eyes    had surgery to remove   Cellulitis of right foot 06/02/2014   CKD (chronic kidney disease) stage 3, GFR 30-59 ml/min (HCC)    Dialysis T, TH, Sat   Diabetic ulcer of right foot ([place]) 06/02/2014   DVT (deep venous thrombosis) ([place]) 10/2014   one in each one in leg- PICC line , one in right and left arm   H/O seasonal allergies    History of blood transfusion    Hypercholesteremia    Hypertension    Left foot infection    Neuromuscular disorder (HCC)    lower legs and feet   Neuropathy in diabetes [facility])    Peripheral vascular disease (HCC)    Sleep apnea    does not use cpap   Staphylococcus aureus bacteremia 10/2014   Type 1 diabetes ([place])    onset age 46   Past Surgical History:  Procedure Laterality Date   AMPUTATION TOE Left 07/24/2015   5th toe and Hallux amputation   AV FISTULA  PLACEMENT Left 08/06/2018   Procedure: INSERTION OF BASCILIC VEN TRANSPOSITION 1ST STAGE;  Surgeon: Rosetta Posner, MD;  [facility];  Service: Vascular;  Laterality: Left;   [place] Left 04/29/2019   Procedure: SECOND STAGE BASCILIC VEIN TRANSPOSITION LEFT ARM;  Surgeon: Rosetta Posner, MD;  Location: [facility];  Service: Vascular;  Laterality: Left;   CESAREAN SECTION     x 1   EXOSTECTECTOMY TOE Left 04/11/2016   Procedure: EXOSTECTECTOMY CUBOID AND 5TH METATARSAL AND PLACEMENT OF ANTIBIOTIC BEADS;  Surgeon: Edrick Kins, DPM;  Location: [place];  Service: Podiatry;  Laterality: Left;   EYE SURGERY Bilateral    removed cataracts - laser   FEMORAL-POPLITEAL BYPASS GRAFT Left 05/03/2015   FOOT AMPUTATION THROUGH METATARSAL Right 2016   hemrrhoidectomy     I & D EXTREMITY Right 06/10/2014   Procedure: IRRIGATION AND DEBRIDEMENT Right Foot;  Surgeon: Newt Minion, MD;  Location: [place];  Service: Orthopedics;  Laterality: Right;   I & D EXTREMITY Right 07/31/2018   Procedure: IRRIGATION AND DEBRIDEMENT EXTREMITY;  Surgeon: Edrick Kins, DPM;  Location: [place];  Service: Podiatry;  Laterality: Right;   I & D EXTREMITY Right  08/04/2018   Procedure: IRRIGATION AND DEBRIDEMENT FOOT with placement of wound vac;  Surgeon: Edrick Kins, DPM;  Location: [place];  Service: Podiatry;  Laterality: Right;   INCISION AND DRAINAGE Left 04/11/2016   Procedure: INCISION AND DRAINAGE A DEEP COMPLICATED WOUND OF LEFT FOOT;  Surgeon: Edrick Kins, DPM;  Location: [place];  Service: Podiatry;  Laterality: Left;   INSERTION OF DIALYSIS CATHETER Right 08/06/2018   Procedure: INSERTION OF TUNNELED DIALYSIS CATHETER;  Surgeon: Rosetta Posner, MD;  [facility];  Service: Vascular;  Laterality: Right;   IR FLUORO GUIDE CV LINE RIGHT  09/24/2017   IR FLUORO GUIDE CV LINE RIGHT  08/01/2018   IR REMOVAL TUN CV CATH W/O FL  11/22/2017   IR US GUIDE VASC ACCESS RIGHT  09/24/2017   IR US GUIDE VASC ACCESS RIGHT   08/01/2018   IRRIGATION AND DEBRIDEMENT ABSCESS Left 05/06/2017   Procedure: IRRIGATION AND DEBRIDEMENT ABSCESS LEFT FOOT;  Surgeon: Edrick Kins, DPM;  Location: [facility];  Service: Podiatry;  Laterality: Left;   PERIPHERAL VASCULAR BALLOON ANGIOPLASTY Left 01/20/2020   Procedure: PERIPHERAL VASCULAR BALLOON ANGIOPLASTY;  Surgeon: Marty Heck, MD;  Location: [place] [facility];  Service: Cardiovascular;  Laterality: Left;  Arm fistula   PERIPHERALLY INSERTED CENTRAL CATHETER INSERTION     SKIN GRAFT Right 06/15/2014   SKIN GRAFT Left 06/2015   foot    SKIN SPLIT GRAFT Right 06/15/2014   Procedure: SPLIT THICKNESS SKIN GRAFT RIGHT FOOT;  Surgeon: Newt Minion, MD;  Location: [place];  Service: Orthopedics;  Laterality: Right;   TRANSMETATARSAL AMPUTATION Right    TRANSMETATARSAL AMPUTATION Right 09/11/2014   WISDOM TOOTH EXTRACTION     Family History  Problem Relation Age of Onset   Hyperlipidemia Mother    Dementia Mother    Social History   Socioeconomic History   Marital status: Single    Spouse name: Not on file   Number of children: Not on file   Years of education: Not on file   Highest education level: Not on file  Occupational History   Occupation: disabled  Tobacco Use   Smoking status: Former    Years: 15.00    Types: Cigarettes    Quit date: 05/16/2008    Years since quitting: 12.8   Smokeless tobacco: Never  Vaping Use   Vaping Use: Never used  Substance and Sexual Activity   Alcohol use: Not Currently    Comment: Occasional   Drug use: No   Sexual activity: Yes    Birth control/protection: Pill  Other Topics Concern   Not on file  Social History Narrative   Lives two roommates.     Social Determinants of Health   Financial Resource Strain: Not on file  Food Insecurity: Not on file  Transportation Needs: Not on file  Physical Activity: Not on file  Stress: Not on file  Social Connections: Not on file   Allergies  Allergen Reactions   Cleocin  [Clindamycin Hcl] Diarrhea   Lisinopril Other (See Comments)    Elevated potassium per pt report   Amoxicillin Diarrhea, Nausea Only and Other (See Comments)    Did it involve swelling of the face/tongue/throat, SOB, or low BP? No Did it involve sudden or severe rash/hives, skin peeling, or any reaction on the inside of your mouth or nose? No Did you need to seek medical attention at a hospital or doctor's office? No When did it last happen?  10 + years If all above answers are "NO", may proceed with cephalosporin use.     Bactrim [Sulfamethoxazole-Trimethoprim] Diarrhea and Nausea Only    Medications   Medications Prior to Admission  Medication Sig Dispense Refill Last Dose   albuterol (PROVENTIL HFA;VENTOLIN HFA) 108 (90 BASE) MCG/ACT inhaler Inhale 2 puffs into the lungs every 6 (six) hours as needed for wheezing or shortness of breath.       amLODipine (NORVASC) 5 MG tablet Take 5 mg by mouth daily.      aspirin EC 81 MG tablet Take 81 mg by mouth daily.      B Complex-C-Zn-Folic Acid (DIALYVITE 409 WITH ZINC) 0.8 MG TABS Take 1 tablet by mouth daily.      calcitRIOL (ROCALTROL) 0.5 MCG capsule Take 1 capsule daily 90 capsule 2    calcium acetate (PHOSLO) 667 MG capsule Take 2 capsules (1,334 mg total) by mouth 3 (three) times daily with meals. 180 capsule 0    Continuous Blood Gluc Receiver ([place]) DEVI 1 each by Does not apply route See admin instructions. Use Dexcom Receiver to monitor blood sugar continuously. 1 each 0    Continuous Blood Gluc Sensor (DEXCOM G6 SENSOR) MISC 1 each by Does not apply route See admin instructions. Use one sensors once every 10 days to monitor blood sugars. 9 each 3    Continuous Blood Gluc Transmit (DEXCOM G6 TRANSMITTER) MISC 1 each by Does not apply route See admin instructions. Use one transmitter once every 90 days. 1 each 3    CONTOUR NEXT TEST test strip USE AS INSTRUCTED TO CHECK BLOOD SUGAR 4 TIMES A DAY. DX E10.65 100 strip 4     Cyanocobalamin (B-12) 5000 MCG CAPS Take 5,000 mcg by mouth daily.      diphenhydrAMINE (BENADRYL) 25 MG tablet Take 25 mg by mouth daily as needed.       folic acid (FOLVITE) 811 MCG tablet Take 400 mcg by mouth daily.      insulin aspart (NOVOLOG) 100 UNIT/ML injection USE A MAX OF 65 UNITS DAILY VIA INSULIN PUMP 10 mL 2    Insulin Human (INSULIN PUMP) SOLN Inject into the skin. Insulin aspart (Novolog) 100 unit/ml---MAX 65 UNITS DAILY      Insulin Lispro-aabc (LYUMJEV KWIKPEN [place]) Inject into the skin.      lidocaine-prilocaine (EMLA) cream Apply 1 application topically daily as needed. Port access      norethindrone (AYGESTIN) 5 MG tablet Take 5 mg by mouth See admin instructions. 5mg  daily for 14 days, then off for 14 days, then repeat cycle. (Patient not taking: Reported on 01/13/2021)      NOVOLOG 100 UNIT/ML injection USE a max OF 65 UNITS EVERY DAY via insulin pump 40 mL 0    NUZYRA 150 MG TABS Take by mouth.      Omega-3 Fatty Acids (OMEGA 3 500) 500 MG CAPS Take 500 mg by mouth daily.      ondansetron (ZOFRAN ODT) 4 MG disintegrating tablet Take 1 tablet (4 mg total) by mouth every 8 (eight) hours as needed for nausea or vomiting. 20 tablet 0    pravastatin (PRAVACHOL) 40 MG tablet Take 1 tablet (40 mg total) by mouth daily. 90 tablet 1      Vitals   Vitals:   03/26/21 1700 03/26/21 1800 03/26/21 1922 03/26/21 1937  BP: (!) 116/46 (!) 108/42    Pulse: 83 86 86   Resp: (!) 21 12 12    Temp:  99.6 F (37.6 C)  TempSrc:    Oral  SpO2: 99% 99% 96%   Weight:      Height:         Body mass index is 21.35 kg/m.  Physical Exam   General: Laying comfortably in bed; intubated. HENT: Normal oropharynx and mucosa. Normal external appearance of ears and nose.  Neck: Supple, no pain or tenderness  CV: No JVD. No peripheral edema.  Pulmonary: Symmetric Chest rise.  Intermittently breathes over the vent. Abdomen: Soft to touch, non-tender. Ext: No cyanosis, edema, or  deformity Skin: No rash. Normal palpation of skin.   Musculoskeletal: Normal digits and nails by inspection. No clubbing.  Neurologic Examination  Mental status/Cognition: No response to voice and out labs. To nares stimulation, she blinks her eyes but does not frown.  Speech/language: No attempts to communicate.  Intubated, does not comprehend or follow any commands.  cranial nerves:   CN II Pupils are 3 mm bilaterally and round.  Pupils are unreactive to light.  She does not blink to threat.   CN III,IV,VI EOM intact to doll's eye, slight left gaze preference.   CN V Corneals intact BL   CN VII no asymmetry, no nasolabial fold flattening   CN VIII Does not turn head towawrds specch   CN IX & X No cough, no gag.   CN XI    CN XII Tongue is midline, does not protrude tongue on command.   Motor/sensory:  Muscle bulk: Poor, tone flaccid. No movement in any of her extremities to noxious stimuli.  Reflexes:  Right Left Comments  Pectoralis      Biceps (C5/6) 1 1   Brachioradialis (C5/6) 1 1    Triceps (C6/7) 1 1    Patellar (L3/4) 1 1    Achilles (S1)      Hoffman      Plantar     Jaw jerk    Coordination/Complex Motor:  Unable to assess due to poor responsiveness - Gait: Deferred for patient safety. Labs   CBC:  Recent Labs  Lab 03/25/21 0148 03/25/21 1717 03/26/21 0140  WBC 7.7  --  6.8  HGB 10.0* 10.5* 8.6*  HCT 33.3* 31.0* 28.4*  MCV 81.6  --  82.6  PLT 144*  --  191    Basic Metabolic Panel:  Lab Results  Component Value Date   NA 133 (L) 03/26/2021   K 5.0 03/26/2021   CO2 24 03/26/2021   GLUCOSE 279 (H) 03/26/2021   BUN 28 (H) 03/26/2021   CREATININE 4.48 (H) 03/26/2021   CALCIUM 8.1 (L) 03/26/2021   GFRNONAA 11 (L) 03/26/2021   GFRAA 16 (L) 08/08/2018   Lipid Panel:  Lab Results  Component Value Date   LDLCALC 78 05/02/2016   HgbA1c:  Lab Results  Component Value Date   HGBA1C 7.8 (H) 03/23/2021   Urine Drug Screen:     Component Value  Date/Time   LABOPIA NONE DETECTED 11/23/2020 0410   COCAINSCRNUR NONE DETECTED 11/23/2020 0410   LABBENZ NONE DETECTED 11/23/2020 0410   AMPHETMU NONE DETECTED 11/23/2020 0410   THCU NONE DETECTED 11/23/2020 0410   LABBARB NONE DETECTED 11/23/2020 0410    Alcohol Level     Component Value Date/Time   ETH <10 03/21/2021 1955    CT Head without contrast(Personally reviewed): ?Small R occipital stroke.  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  MRI Brain(Personally reviewed): pending  Cer[facility] EEG:  pending  Impression   Lichelle Viets Rabold is a 50 y.o. female with hx of Type 1DM, ESRD on HD, prior DVTs, HLD, HTN, OSA, PVD who is admitted with poor responsiveness in the setting of significant hyperglycemia. Stroke code was activated for concern for L gaze preference and unresponsiveness. Mild L gaze preference on my evaluation but extraocular movements are intact to doll's eye maneuver.  She is obtunded, has patchy brainstem reflexes with no evidence of higher cerebral function at this time.  Will get her up ceribell EEG for evaluation of potential seizures from hyperglycemia.  Unclear if the noted ? R occipital hypodensity is truly a stroke or an artifact.  Recommendations  - [place] EEG overnight and will switch to cEEG in AM. - MRI Brain without contrast. - Further recs pending above. ______________________________________________________________________  Thank you for the opportunity to take part in the care of this patient. If you have any further questions, please contact the neurology consultation attending.  Signed,  [place] Pager Number [phone] _ _ _   _ __   _ __ _ _  __ __   _ __   __ _

## 2021-03-26 NOTE — Significant Event (Signed)
Rapid Response Event Note   Reason for Call :  Pt unresponsive with unreactive pupils  Initial Focused Assessment:  Pt lying in bed with eyes open, in no distress. Pt with L sided gaze, B hemianopia, and unresponsiveness to deep painful stimuli. Pupils 3, equal and unreactive. NIH-32, LSN-unclear but charted at 0800 that pt had purposeful movement in all 4 extremities and reactive pupils. Code Stroke initiated at 2057-see Code Stroke note for details.     Interventions:  CBG-419  Code Stroke initiated at 2057: CT-neg, CTA-no LVO EEG order Plan of Care:  RN to update MD of happenings and initiate any orders needed.    Event Summary:   MD Notified: Warren Lacy RN notified by bedside RN and to notify [facility] MD. Dr. Carmine Savoy) notified and came to bedside.  Call [place], Marjorie Deprey Anderson, RN

## 2021-03-26 NOTE — Progress Notes (Signed)
Hypoglycemic Event  CBG: 62  Treatment: D50 25 mL (12.5 gm)  Symptoms: None  Follow-up CBG: Time:0020 CBG Result:107  Possible Reasons for Event: Other: NPO  Comments/MD notified: Elink    [name]

## 2021-03-26 NOTE — Progress Notes (Signed)
PT Cancellation Note  Patient Details Name: [name] MRN: [id number] DOB: January 13, 1971   Cancelled Treatment:    Reason Eval/Treat Not Completed: Medical issues which prohibited therapy. Pt remains intubated, PT will follow up tomorrow.   [name] 03/26/2021, 9:14 AM

## 2021-03-26 NOTE — Progress Notes (Signed)
RT note: RT and RN transported vent patient to CT and back to 27M 02. Vitals signs stable through out.

## 2021-03-27 ENCOUNTER — Inpatient Hospital Stay (HOSPITAL_COMMUNITY): Payer: Medicare HMO

## 2021-03-27 DIAGNOSIS — E8721 Acute metabolic acidosis: Secondary | ICD-10-CM

## 2021-03-27 DIAGNOSIS — G9341 Metabolic encephalopathy: Secondary | ICD-10-CM

## 2021-03-27 LAB — BASIC METABOLIC PANEL
Anion gap: 18 — ABNORMAL HIGH (ref 5–15)
BUN: 51 mg/dL — ABNORMAL HIGH (ref 6–20)
CO2: 24 mmol/L (ref 22–32)
Calcium: 8.4 mg/dL — ABNORMAL LOW (ref 8.9–10.3)
Chloride: 92 mmol/L — ABNORMAL LOW (ref 98–111)
Creatinine, Ser: 6.19 mg/dL — ABNORMAL HIGH (ref 0.44–1.00)
GFR, Estimated: 8 mL/min — ABNORMAL LOW (ref >60)
Glucose, Bld: 290 mg/dL — ABNORMAL HIGH (ref 70–99)
Potassium: 5.3 mmol/L — ABNORMAL HIGH (ref 3.5–5.1)
Sodium: 134 mmol/L — ABNORMAL LOW (ref 135–145)

## 2021-03-27 LAB — CBC
HCT: 33.3 % — ABNORMAL LOW (ref 36.0–46.0)
Hemoglobin: 9.7 g/dL — ABNORMAL LOW (ref 12.0–15.0)
MCH: 24 pg — ABNORMAL LOW (ref 26.0–34.0)
MCHC: 29.1 g/dL — ABNORMAL LOW (ref 30.0–36.0)
MCV: 82.2 fL (ref 80.0–100.0)
Platelets: 364 10*3/uL (ref 150–400)
RBC: 4.05 MIL/uL (ref 3.87–5.11)
RDW: 17.5 % — ABNORMAL HIGH (ref 11.5–15.5)
WBC: 11.5 10*3/uL — ABNORMAL HIGH (ref 4.0–10.5)
nRBC: 0 % (ref 0.0–0.2)

## 2021-03-27 LAB — GLUCOSE, CAPILLARY
Glucose-Capillary: 388 mg/dL — ABNORMAL HIGH (ref 70–99)
Glucose-Capillary: 392 mg/dL — ABNORMAL HIGH (ref 70–99)
Glucose-Capillary: 286 mg/dL — ABNORMAL HIGH (ref 70–99)
Glucose-Capillary: 94 mg/dL (ref 70–99)
Glucose-Capillary: 143 mg/dL — ABNORMAL HIGH (ref 70–99)
Glucose-Capillary: 36 mg/dL — CL (ref 70–99)
Glucose-Capillary: 119 mg/dL — ABNORMAL HIGH (ref 70–99)
Glucose-Capillary: 118 mg/dL — ABNORMAL HIGH (ref 70–99)
Glucose-Capillary: 232 mg/dL — ABNORMAL HIGH (ref 70–99)

## 2021-03-27 LAB — MAGNESIUM: Magnesium: 2.8 mg/dL — ABNORMAL HIGH (ref 1.7–2.4)

## 2021-03-27 MED ORDER — ACETAMINOPHEN 160 MG/5ML PO SOLN
500.0000 mg | Freq: Four times a day (QID) | ORAL | Status: DC | PRN
  Administered 2021-03-27 – 2021-03-30 (×2): 500 mg
  Filled 2021-03-27 (×2): qty 20.3

## 2021-03-27 MED ORDER — INSULIN DETEMIR 100 UNIT/ML ~~LOC~~ SOLN
5.0000 [IU] | Freq: Two times a day (BID) | SUBCUTANEOUS | Status: DC
Start: 2021-03-27 — End: 2021-03-31
  Administered 2021-03-28 – 2021-03-30 (×6): 5 [IU] via SUBCUTANEOUS
  Filled 2021-03-27 (×10): qty 0.05

## 2021-03-27 MED ORDER — DEXTROSE 50 % IV SOLN
INTRAVENOUS | Status: AC
  Administered 2021-03-27: 14:00:00 25 g via INTRAVENOUS
  Filled 2021-03-27: qty 50

## 2021-03-27 MED ORDER — ARTIFICIAL TEARS OPHTHALMIC OINT
TOPICAL_OINTMENT | OPHTHALMIC | Status: DC | PRN
  Administered 2021-03-27 – 2021-03-30 (×3): 1 via OPHTHALMIC
  Filled 2021-03-27 (×3): qty 3.5

## 2021-03-27 MED ORDER — INSULIN ASPART 100 UNIT/ML IJ SOLN
0.0000 [IU] | INTRAMUSCULAR | Status: DC
  Administered 2021-03-27: 23:00:00 2 [IU] via SUBCUTANEOUS
  Administered 2021-03-27: 09:00:00 3 [IU] via SUBCUTANEOUS
  Administered 2021-03-28: 13:00:00 4 [IU] via SUBCUTANEOUS
  Administered 2021-03-28: 03:00:00 3 [IU] via SUBCUTANEOUS
  Administered 2021-03-28 – 2021-03-29 (×5): 2 [IU] via SUBCUTANEOUS
  Administered 2021-03-29 (×2): 1 [IU] via SUBCUTANEOUS
  Administered 2021-03-30: 04:00:00 2 [IU] via SUBCUTANEOUS

## 2021-03-27 MED ORDER — DEXTROSE 50 % IV SOLN: 25.0000 g | INTRAVENOUS | Status: AC

## 2021-03-27 NOTE — Progress Notes (Signed)
EEG maintenance performed.  No skin breakdown observed at electrode sites Fp1, Fp2. 

## 2021-03-27 NOTE — Progress Notes (Signed)
Inpatient Diabetes Program Recommendations  AACE/ADA: New Consensus Statement on Inpatient Glycemic Control (2015)  Target Ranges:  Prepandial:   less than 140 mg/dL      Peak postprandial:   less than 180 mg/dL (1-2 hours)      Critically ill patients:  140 - 180 mg/dL   Lab Results  Component Value Date   GLUCAP 286 (H) 03/27/2021   HGBA1C 7.8 (H) 03/23/2021    Review of Glycemic Control  Latest Reference Range & Units 03/26/21 08:16 03/26/21 11:16 03/26/21 16:17 03/26/21 19:03 03/26/21 23:00 03/27/21 01:42 03/27/21 02:59 03/27/21 07:03  Glucose-Capillary 70 - 99 mg/dL 227 (H) 317 (H) 366 (H) 419 (H) 504 (HH) 388 (H) 392 (H) 286 (H)  (HH): Data is critically high (H): Data is abnormally high  Diabetes history: DM1(does not make insulin.  Needs correction, basal and meal coverage) Outpatient Diabetes medications: Insulin pump Current orders for Inpatient glycemic control: Levemir 5 units BID and Novolog 0-6 units Q4H  Inpatient Diabetes Recommendations:  Has not received basal insulin since 03/24/21 at 2151.  1-Please administer basal insulin now. 2-tube feed coverage 2 unit Q4H.  Stop if feeds held or discontinued.     Will continue to follow while inpatient.  Thank you, Reche Dixon, RN, BSN Diabetes Coordinator Inpatient Diabetes Program [phone] (team pager from 8a-5p)

## 2021-03-27 NOTE — Procedures (Addendum)
Patient Name: ALFIE ALDERFER  MRN: [id number]  Epilepsy Attending: Lora Havens  Referring Physician/Provider: Dr Donnetta Simpers Duration: 03/26/2021 2222 to 03/27/2021 2222  Patient history: 50 y.o. female with hx of Type 1DM, ESRD on HD, prior DVTs, HLD, HTN, OSA, PVD who is admitted with poor responsiveness in the setting of significant hyperglycemia. EEG to evaluate for seizure  Level of alertness: Awake  AEDs during EEG study: None  Technical aspects: This EEG study was done with scalp electrodes positioned according to the 10-20 International system of electrode placement. Electrical activity was acquired at a sampling rate of 500Hz  and reviewed with a high frequency filter of 70Hz  and a low frequency filter of 1Hz . EEG data were recorded continuously and digitally stored.   Description: At the beginning of the study, EEG showed  posterior dominant rhythm of 8Hz  activity of moderate voltage (25-35 uV) seen predominantly in posterior head regions, asymmetric ( right<left) and reactive to eye opening and eye closing.   EEG also showed continuous low amplitude 3 to 6 Hz theta-delta slowing in right hemisphere as well as intermittent rhythmic 2-3hz  delta slowing. Gradually, EEG showed continuous generalized and lateralized right hemisphere polymorphic sharply contoured 3-6hz  theta-delta slowing.   Hyperventilation and photic stimulation were not performed.   Of note, parts of study were difficult due to significant myogenic and electrode artifact.    ABNORMALITY - Continuous slow, generalized and lateralized right hemisphere - Background asymmetry, right<left   IMPRESSION: This study is suggestive of cortical dysfunction arising from right hemisphere which could be secondary to underlying structural abnormality. Additionally there is moderate to severe diffuse encephalopathy, nonspecific etiology. No seizures or definite epileptiform discharges were seen throughout the  recording.  Blaze Nylund Barbra Sarks

## 2021-03-27 NOTE — Progress Notes (Signed)
[place] Progress Note Patient Name: [name] DOB: November 28, 1970 MRN: [id number]   Date of Service  03/27/2021  HPI/Events of Note  Notified of voluminous coffee ground emesis approximately 100 cc and another 100 cc once tube placed on LIWS No hemodynamic changes, no desaturation  eICU Interventions  Await CBC result Ordered CXR and KUB     Intervention Category Intermediate Interventions: Other:  Judd Lien 03/27/2021, 5:41 AM

## 2021-03-27 NOTE — Progress Notes (Signed)
Pt transported to MRI via vent. No complications noted. Clayborn Heron RRT notified of pt's return to the unit.

## 2021-03-27 NOTE — Progress Notes (Signed)
OT Cancellation Note  Patient Details Name: NOTNAMED CROUCHER MRN: [id number] DOB: [birth year]   Cancelled Treatment:    Reason Eval/Treat Not Completed: Patient not medically ready. Pt reintubated, code stroke called due to obtunded, L gaze, pupils unreactive.  Will follow and see as medically appropriate and able.   Jolaine Artist, OT Acute Rehabilitation Services Pager [phone] Office [phone]   Delight Stare 03/27/2021, 9:54 AM

## 2021-03-27 NOTE — Progress Notes (Signed)
Pt with desaturation to 82% on full support vent settings. Pt currently on 60% and +5. Bilateral breath sounds heard at this time. 100% Oxygen breath given with minimal improvement. Pt suctioned for small amount of thick tan secretions. Recruitment maneuver performed for 2 minutes. Pt tolerated well. SpO2 rose to 98%. Pt returned previous full support settings. SpO2 remains at 95-98%. RT will continue to monitor and be available as needed.

## 2021-03-27 NOTE — Progress Notes (Signed)
[place] Progress Note Patient Name: [name] DOB: 07-28-1970 MRN: [id number]   Date of Service  03/27/2021  HPI/Events of Note  pH, Arterial: 7.245 (L) pCO2 arterial: 41.9 pO2, Arterial: 56 (L) TCO2: 19 (L) Acid-base deficit: 9.0 (H) Bicarbonate: 18.2 (L) O2 Saturation: 84.0  On FiO2 40%/5 PEEP  eICU Interventions  FiO2 increased to 60%, sats now at 94-96%     Intervention Category Major Interventions: Hypoxemia - evaluation and management  Judd Lien 03/27/2021, 12:57 AM

## 2021-03-27 NOTE — Progress Notes (Signed)
NAME:  [name], MRN:  [id number], DOB:  Jun 04, 1970, LOS: 4 ADMISSION DATE:  03/07/2021, CONSULTATION DATE:  12/22 REFERRING MD:  Tyrone Nine, CHIEF COMPLAINT:  AMS/HHS   History of Present Illness:  [name] is a 50 year old lady with PMH significant for type 1 DM on insulin pump, ESRD on HD among multiple medical conditions listed in [place]. Patient presented to the ED with altered mental status.  It was reported that the patient had been last seen around 0930 AM on day of presentation. The patient's sister texted her several times but received no response.  Hence the sister called one of the patient's roommate and asked him to check on her.  She was found confused and incoherent. She was brought to the ED for further evaluation.   In the Ed the patient was found to be severely hyperglycemic with BS > 900. She was initially referred to the hospitalist team for admission. However her mental status worsened  so CCM team was asked to admit her. It was reported that the patient had episodes of nausea and vomiting en route to hospital. The nurses described her as incoherent but by the time PCCM was called to see her, she was unresponsive with episodes of grunting. She could not contribute to this consultation.   The patient's sister [name] who contributed to this consultation by phone reported that the patient has had a similar episode like this in the past.  [name] reported that it took her sister several day before she became coherent.  Pertinent  Medical History  T1DM on insulin pump ESRD on HD T/Th/S Previous DVT Hyperlipidemia HTN Diabetic neuropathy OSA PVD  Significant Hospital Events: Including procedures, antibiotic start and stop dates in addition to other pertinent events   12/22 admitted to ICU, patient intubated and started on insulin drip, had dialysis 12/23 off insulin gtt, still not awake failed SBT 12/24 extubated and required reinbuation due to cheynes stokes  respirations 12/25 got HD with 2.5L off. code stroke called in evening  Interim History / Subjective:  Overnight code stroke called. Overnight EEG negative for seizures. On cEEG this morning. CT head with questionable subacute occipital stroke vs artifact. CT angio negative for LVO. Tube feeds held due to emesis overnight.   Objective   Blood pressure (!) 125/50, pulse 95, temperature 98.6 F (37 C), temperature source Oral, resp. rate (!) 7, height 5\' 6"  (1.676 m), weight 60 kg, SpO2 98 %.    Vent Mode: PRVC FiO2 (%):  [40 %-80 %] 80 % Set Rate:  [12 bmp] 12 bmp Vt Set:  [480 mL] 480 mL PEEP:  [5 cmH20] 5 cmH20 Plateau Pressure:  [21 cmH20-26 cmH20] 26 cmH20   Intake/Output Summary (Last 24 hours) at 03/27/2021 1002 Last data filed at 03/27/2021 0800 Gross per 24 hour  Intake 1355.07 ml  Output 200 ml  Net 1155.07 ml   Filed Weights   03/24/21 0500 03/25/21 0600 03/26/21 0500  Weight: 61.7 kg 64 kg 60 kg    Examination: Gen:      Intubated, sedated, acutely ill appearing HEENT:  ETT to vent Lungs:    sounds of mechanical ventilation auscultated , diminished bilateral bases CV:         tachycardic, regular, systolic murmur Abd:      + bowel sounds; soft, non-tender; no palpable masses, no distension Ext:    Diffusely edematous Skin:      Warm and dry; no rashes Neuro:  sedated, RASS -3. Pupils are symmetric, miotic,  and not reactive.    Labs/imaging reviewed: Reviewed CT Head and CT angio, findings noted above.  K 5.3 Na 134 Glucose 290 Cr 6.19 BUN 51 WBC 11.5 Hgb 9.7   Resolved Hospital Problem list     Assessment & Plan:   Acute hypoxic respiratory failure 2/2 acute pulmonary edema - initially intubated for encephalopathy, now with acute pulmonary edema from fluid resuscitation. Needs another run of dialysis before reattempting extubation due to initial failure 12/24.  - failed extubation attempt 12/24 due to pulmonary edema, cheynes stokes, respirations' -  maintain full vent support today - currently also encephalopathic and undergoing encephalopathy work up.   Acute metabolic encephalopathy 2/2 HHS coma Agitation while on ventilator   Code Stroke - called 12/25 evening CT Head possible occipital artifact? CT Angio negative for lLVO MRI brain ordered today.  Suspect metabolic from hyperglycemia with component of derlirum. - continue precedex/fentanyl until extubated - appreciate neurology recommendations  HHS Type 1 diabetes on insulin pump - HHS resolved - reduced semglee to 5 units BID today with lowering SSI for preventing Hypoglycemia - trickle back tube feeds this morning, add tf coverage as needed . - will transition to insulin pump at discharge or when she is able to manage it.   ESRD on HD T/Th/S - monitor electrolytes - Nephrology consulted for HD, TTS through LUE AVF. - last day of Hd was 12/24 evening. Will see if she can get one more session with volume removal before extubating. Per sister dry weight around 57 kg.   HTN Patient became hypertensive, frequently during episodes of agitation.  - Amlodipine 5 mg daily - Hydralazine 10 mg q4h prn - Sedation for agitation as above  Hyperlipidemia - Pravastatin 40 mg daily  Anemia Likely secondary to anemia of chronic kidney disease. Hgb stable   Best Practice (right click and "Reselect all SmartList Selections" daily)   Diet/type: tubefeeds were held, will trickle back today DVT prophylaxis: prophylactic heparin  GI prophylaxis: PPI Lines: N/A Foley:  N/A Code Status:  full code Last date of multidisciplinary goals of care discussion [sister alicia updated 29/47 over the phone.]  Labs   CBC: Recent Labs  Lab 03/23/21 0128 03/24/21 0231 03/25/21 0148 03/25/21 1717 03/26/21 0140 03/26/21 2339 03/27/21 0648  WBC 9.9 3.4* 7.7  --  6.8  --  11.5*  HGB 10.8* 9.1* 10.0* 10.5* 8.6* 11.6* 9.7*  HCT 35.0* 29.2* 33.3* 31.0* 28.4* 34.0* 33.3*  MCV 78.8* 78.5* 81.6   --  82.6  --  82.2  PLT 273 164 144*  --  231  --  654    Basic Metabolic Panel: Recent Labs  Lab 03/23/21 1225 03/23/21 1902 03/23/21 2141 03/24/21 0231 03/24/21 0852 03/24/21 1653 03/24/21 2022 03/25/21 1717 03/26/21 0140 03/26/21 0932 03/26/21 2339 03/27/21 0648  NA 135 134* 134* 135   < > 134* 134* 134* 131* 133* 126* 134*  K 5.1 4.7 4.4 2.8*   < > 4.0 4.2 4.5 2.6* 5.0 5.4* 5.3*  CL 95* 93* 95* 98   < > 98 98  --  93* 96*  --  92*  CO2 26 25 25 29    < > 25 25  --  27 24  --  24  GLUCOSE 100* 123* 99 97   < > 120* 122*  --  86 279*  --  290*  BUN 52* 57* 58* 14   < > 31* 33*  --  9 28*  --  51*  CREATININE 7.15* 7.24* 7.35* 2.28*   < > 4.67* 4.82*  --  1.72* 4.48*  --  6.19*  CALCIUM 8.6* 8.7* 8.6* 7.3*   < > 8.4* 8.3*  --  6.9* 8.1*  --  8.4*  MG 2.1 2.2 2.1 1.8  --  2.1  --   --   --   --   --  2.8*  PHOS 6.9* 7.2* 7.4* 2.5  --  5.6*  --   --   --   --   --   --    < > = values in this interval not displayed.   GFR: Estimated Creatinine Clearance: 10.3 mL/min (A) (by C-G formula based on SCr of 6.19 mg/dL (H)). Recent Labs  Lab 03/07/2021 1844 03/21/2021 2044 03/23/21 0128 03/24/21 0231 03/25/21 0148 03/26/21 0140 03/27/21 0648  WBC  --   --    < > 3.4* 7.7 6.8 11.5*  LATICACIDVEN 2.0* 2.0*  --   --   --   --   --    < > = values in this interval not displayed.    Liver Function Tests: Recent Labs  Lab 04/01/2021 1840  AST 63*  ALT 47*  ALKPHOS 383*  BILITOT 1.1  PROT 7.7  ALBUMIN 3.5   No results for input(s): LIPASE, AMYLASE in the last 168 hours. No results for input(s): AMMONIA in the last 168 hours.  ABG    Component Value Date/Time   PHART 7.245 (L) 03/26/2021 2339   PCO2ART 41.9 03/26/2021 2339   PO2ART 56 (L) 03/26/2021 2339   HCO3 18.2 (L) 03/26/2021 2339   TCO2 19 (L) 03/26/2021 2339   ACIDBASEDEF 9.0 (H) 03/26/2021 2339   O2SAT 84.0 03/26/2021 2339     Coagulation Profile: No results for input(s): INR, PROTIME in the last 168  hours.  Cardiac Enzymes: No results for input(s): CKTOTAL, CKMB, CKMBINDEX, TROPONINI in the last 168 hours.  HbA1C: Hemoglobin A1C  Date/Time Value Ref Range Status  11/02/2020 10:26 AM 8.1 (A) 4.0 - 5.6 % Final  02/17/2020 01:32 PM 9.4 (A) 4.0 - 5.6 % Final  10/22/2019 12:00 AM 8.4  Final   Hgb A1c MFr Bld  Date/Time Value Ref Range Status  03/23/2021 04:44 AM 7.8 (H) 4.8 - 5.6 % Final    Comment:    (NOTE) Pre diabetes:          5.7%-6.4%  Diabetes:              >6.4%  Glycemic control for   <7.0% adults with diabetes   07/30/2018 01:10 AM 10.4 (H) 4.8 - 5.6 % Final    Comment:    (NOTE) Pre diabetes:          5.7%-6.4% Diabetes:              >6.4% Glycemic control for   <7.0% adults with diabetes     CBG: Recent Labs  Lab 03/26/21 1903 03/26/21 2300 03/27/21 0142 03/27/21 0259 03/27/21 0703  GLUCAP 419* 504* 388* 392* 286*    Critical care time: 42 minutes    The patient is critically ill due to respiratory failure, encephalopathy.  Critical care was necessary to treat or prevent imminent or life-threatening deterioration.  Critical care was time spent personally by me on the following activities: development of treatment plan with patient and/or surrogate as well as nursing, discussions with consultants, evaluation of patient's response to treatment, examination of patient, obtaining  history from patient or surrogate, ordering and performing treatments and interventions, ordering and review of laboratory studies, ordering and review of radiographic studies, pulse oximetry, re-evaluation of patient's condition and participation in multidisciplinary rounds.   Critical Care Time devoted to patient care services described in this note is 42 minutes. This time reflects time of care of this [place] . This critical care time does not reflect separately billable procedures or procedure time, teaching time or supervisory time of PA/NP/Med student/Med  Resident etc but could involve care discussion time.       Spero Geralds [facility] 03/27/2021 10:02 AM  Pager: see AMION  If no response to pager , please call critical care on call (see AMION) until 7pm After 7:00 pm call Elink

## 2021-03-27 NOTE — Progress Notes (Signed)
LTM EEG hooked up and running - no initial skin breakdown - push button tested - neuro notified. [facility] monitoring.  

## 2021-03-27 NOTE — Progress Notes (Signed)
[facility] Progress Note    Assessment/ Plan:    Hyperosmolar Hyperglycemia Syndrome - s/p insulin gtt. Per primary service  Acute metabolic encephalopathy - due to HHS coma.  S/p intubation and vent settings per PCCM VDRF - extubated but had to be reintubated  ESRD - on HD TTS. Plan next HD on Tues 12/27  Hypertension/volume  - up 3kg , UF 3 L w/ next HD as tolerated. CXR today w/ improved bilat effusions and ^'d aeration. No gross edema.   Anemia  - stable, continue with ESA  Metabolic bone disease -  npo for now  Nutrition - npo for now  OP HD: [place] TTS    4h  57.5kg   400/500  2/2 bath  LUE AVF  Hep none   Hectoral 11 mcg IV/HD Micera 225 mcg every 2 weeks (last given 03/09/21 Venofer  50 mg IV every Tuesday  Kelly Splinter, MD 03/27/2021, 5:41 PM      Subjective:   Pt seen in ICU, on vent   Objective:   BP (S) (!) 141/65 Comment: just before MRI   Pulse 73    Temp 98.1 F (36.7 C) (Oral)    Resp 13    Ht _0  (1.676 m)    Wt 60 kg    SpO2 100%    BMI 21.35 kg/m   Intake/Output Summary (Last 24 hours) at 03/27/2021 1738 Last data filed at 03/27/2021 1700 Gross per 24 hour  Intake 800.2 ml  Output 200 ml  Net 600.2 ml    Weight change:   Physical Exam: Gen: sedated CVS:s1s2, rrr Resp:intubated, cta bl RFV:OHKG Ext: b/l UE edema, +dependent edema b/l thighs Neuro: sedated Dialysis access: LUE AVF +b/t   Labs: BMET Recent Labs  Lab 03/23/21 0444 03/23/21 0923 03/23/21 1225 03/23/21 1902 03/23/21 2141 03/24/21 0231 03/24/21 0852 03/24/21 1226 03/24/21 1653 03/24/21 2022 03/25/21 1717 03/26/21 0140 03/26/21 0932 03/26/21 2339 03/27/21 0648  NA 132* 135 135 134* 134* 135 133* 132* 134* 134* 134* 131* 133* 126* 134*  K 4.8 5.2* 5.1 4.7 4.4 2.8* 5.0 6.2* 4.0 4.2 4.5 2.6* 5.0 5.4* 5.3*  CL 92* 94* 95* 93* 95* 98 99 97* 98 98  --  93* 96*  --  92*  CO_1 25  --  27 24  --  24  GLUCOSE 186* 123* 100* 123* 99  97 104* 119* 120* 122*  --  86 279*  --  290*  BUN 50* 53* 52* 57* 58* 14 27* 29* 31* 33*  --  9 28*  --  51*  CREATININE 6.86* 7.02* 7.15* 7.24* 7.35* 2.28* 4.35* 4.46* 4.67* 4.82*  --  1.72* 4.48*  --  6.19*  CALCIUM 8.5* 8.5* 8.6* 8.7* 8.6* 7.3* 8.0* 8.1* 8.4* 8.3*  --  6.9* 8.1*  --  8.4*  PHOS 7.2* 6.9* 6.9* 7.2* 7.4* 2.5  --   --  5.6*  --   --   --   --   --   --     CBC Recent Labs  Lab 03/24/21 0231 03/25/21 0148 03/25/21 1717 03/26/21 0140 03/26/21 2339 03/27/21 0648  WBC 3.4* 7.7  --  6.8  --  11.5*  HGB 9.1* 10.0* 10.5* 8.6* 11.6* 9.7*  HCT 29.2* 33.3* 31.0* 28.4* 34.0* 33.3*  MCV 78.5* 81.6  --  82.6  --  82.2  PLT 164 144*  --  231  --  364     Medications:     amLODipine  5 mg Per Tube Daily   aspirin  162 mg Per Tube Daily   chlorhexidine gluconate (MEDLINE KIT)  15 mL Mouth Rinse BID   Chlorhexidine Gluconate Cloth  6 each Topical Q0600   collagenase   Topical Daily   darbepoetin (ARANESP) injection - DIALYSIS  40 mcg Intravenous Q Thu-HD   docusate  100 mg Per Tube BID   fentaNYL (SUBLIMAZE) injection  50 mcg Intravenous Once   folic acid  1 mg Per Tube Daily   heparin  5,000 Units Subcutaneous Q8H   insulin aspart  0-6 Units Subcutaneous Q4H   insulin detemir  5 Units Subcutaneous BID   mouth rinse  15 mL Mouth Rinse 10 times per day   polyethylene glycol  17 g Per Tube Daily   pravastatin  40 mg Per Tube [id number]

## 2021-03-27 NOTE — Progress Notes (Signed)
PT Cancellation Note  Patient Details Name: [name] MRN: [id number] DOB: 11-02-1970   Cancelled Treatment:    Reason Eval/Treat Not Completed: Medical issues which prohibited therapy Pt reintubated, code stroke called due to obtunded, L gaze, pupils unreactive.  Pt not appropriate for PT today, spoke with RN who agrees. Abran Richard, PT Acute Rehab Services Pager [phone] [facility] [place] 03/27/2021, 9:47 AM

## 2021-03-27 NOTE — Progress Notes (Addendum)
Patient skin clammy to touch, checked CBG for suspected hypoglycemia, CBG was 36, hypoglycemia standing orders signed and amp of 25 g of D50 given  at 1345. Recheck CBG at 1412 is 143.   Will continue to monitor.   Dewaine Oats

## 2021-03-28 ENCOUNTER — Inpatient Hospital Stay (HOSPITAL_COMMUNITY): Payer: Medicare HMO

## 2021-03-28 DIAGNOSIS — G9341 Metabolic encephalopathy: Secondary | ICD-10-CM

## 2021-03-28 DIAGNOSIS — I6389 Other cerebral infarction: Secondary | ICD-10-CM | POA: Diagnosis not present

## 2021-03-28 DIAGNOSIS — G931 Anoxic brain damage, not elsewhere classified: Secondary | ICD-10-CM

## 2021-03-28 DIAGNOSIS — D72829 Elevated white blood cell count, unspecified: Secondary | ICD-10-CM

## 2021-03-28 DIAGNOSIS — E1065 Type 1 diabetes mellitus with hyperglycemia: Secondary | ICD-10-CM

## 2021-03-28 DIAGNOSIS — Z978 Presence of other specified devices: Secondary | ICD-10-CM

## 2021-03-28 DIAGNOSIS — E870 Hyperosmolality and hypernatremia: Secondary | ICD-10-CM

## 2021-03-28 DIAGNOSIS — E1022 Type 1 diabetes mellitus with diabetic chronic kidney disease: Secondary | ICD-10-CM

## 2021-03-28 DIAGNOSIS — J810 Acute pulmonary edema: Secondary | ICD-10-CM

## 2021-03-28 DIAGNOSIS — E1069 Type 1 diabetes mellitus with other specified complication: Principal | ICD-10-CM

## 2021-03-28 LAB — CBC
HCT: 29.9 % — ABNORMAL LOW (ref 36.0–46.0)
Hemoglobin: 8.9 g/dL — ABNORMAL LOW (ref 12.0–15.0)
MCH: 24.1 pg — ABNORMAL LOW (ref 26.0–34.0)
MCHC: 29.8 g/dL — ABNORMAL LOW (ref 30.0–36.0)
MCV: 81 fL (ref 80.0–100.0)
Platelets: 328 10*3/uL (ref 150–400)
RBC: 3.69 MIL/uL — ABNORMAL LOW (ref 3.87–5.11)
RDW: 17.7 % — ABNORMAL HIGH (ref 11.5–15.5)
WBC: 14.7 10*3/uL — ABNORMAL HIGH (ref 4.0–10.5)
nRBC: 0 % (ref 0.0–0.2)

## 2021-03-28 LAB — GLUCOSE, CAPILLARY
Glucose-Capillary: 254 mg/dL — ABNORMAL HIGH (ref 70–99)
Glucose-Capillary: 224 mg/dL — ABNORMAL HIGH (ref 70–99)
Glucose-Capillary: 306 mg/dL — ABNORMAL HIGH (ref 70–99)
Glucose-Capillary: 123 mg/dL — ABNORMAL HIGH (ref 70–99)
Glucose-Capillary: 86 mg/dL (ref 70–99)
Glucose-Capillary: 99 mg/dL (ref 70–99)
Glucose-Capillary: 135 mg/dL — ABNORMAL HIGH (ref 70–99)

## 2021-03-28 LAB — ECHOCARDIOGRAM COMPLETE
Area-P 1/2: 3.1 cm2
Height: 66 in
S' Lateral: 2.6 cm
Weight: 2169.33 oz

## 2021-03-28 MED ORDER — NOREPINEPHRINE 4 MG/250ML-% IV SOLN
INTRAVENOUS | Status: AC
  Filled 2021-03-28: qty 250

## 2021-03-28 MED ORDER — SODIUM CHLORIDE 0.9 % IV SOLN
1.0000 g | INTRAVENOUS | Status: DC
  Administered 2021-03-28 – 2021-03-31 (×4): 1 g via INTRAVENOUS
  Filled 2021-03-28 (×4): qty 1

## 2021-03-28 MED ORDER — NOREPINEPHRINE 4 MG/250ML-% IV SOLN: 0.0000 ug/min | INTRAVENOUS | Status: DC

## 2021-03-28 NOTE — Progress Notes (Signed)
[facility] Progress Note Patient Name: [name] DOB: 09-05-1970 MRN: [id number]   Date of Service  03/28/2021  HPI/Events of Note  Hypotension - Norepinephrine IV infusion restarted by nursing. Request for order for Norepinephrine IV infusion.   eICU Interventions  Plan: Norepinephrine IV infusion. Titrate to MAP >= 65.      Intervention Category Major Interventions: Hypotension - evaluation and management  Orian Amberg Eugene 03/28/2021, 8:11 PM

## 2021-03-28 NOTE — Procedures (Signed)
Central Venous Catheter Insertion Procedure Note  [name]  [id number]  1970-05-18  Date:03/28/21  Time:10:36 AM   Provider Performing:Carnisha Feltz A Tacha Manni   Procedure: Insertion of Non-tunneled Central Venous Catheter(36556)with US guidance (90300)    Indication(s) Hemodialysis  Consent Patient's sister, POA gave consent over the phone  Anesthesia Topical only with 1% lidocaine   Timeout Verified patient identification, verified procedure, site/side was marked, verified correct patient position, special equipment/implants available, medications/allergies/relevant history reviewed, required imaging and test results available.  Sterile Technique Maximal sterile technique including full sterile barrier drape, hand hygiene, sterile gown, sterile gloves, mask, hair covering, sterile ultrasound probe cover (if used).  Procedure Description Area of catheter insertion was cleaned with chlorhexidine and draped in sterile fashion.   With real-time ultrasound guidance a HD catheter was placed into the left internal jugular vein.  Nonpulsatile blood flow and easy flushing noted in all ports.  The catheter was sutured in place and sterile dressing applied.  Complications/Tolerance None; patient tolerated the procedure well. Chest X-ray is ordered to verify placement for internal jugular or subclavian cannulation.  Chest x-ray is not ordered for femoral cannulation.  EBL Minimal  Specimen(s) None

## 2021-03-28 NOTE — Progress Notes (Signed)
RT to bedside for desaturation to 77%. Pt FiO2 turned up to 100% and peep increased as well from 5 to 10. Pt did not improve with suctioning and was taken off the ventilator and bagged with a peep valve at [place]. HR dropped as low as 46 .Pt SpO2 rose to 96% and HR increased to the 90's.  No issues bagging pt. Pt placed back on ventilator and left on 100% +10. RT will wean as tolerated. Will continue to monitor and be available as needed.

## 2021-03-28 NOTE — Progress Notes (Signed)
NAME:  [name], MRN:  [id number], DOB:  1970/10/17, LOS: 5 ADMISSION DATE:  03/14/2021, CONSULTATION DATE:  12/22 REFERRING MD:  Tyrone Nine, CHIEF COMPLAINT:  AMS/HHS   History of Present Illness:  [name] is a 50 year old lady with PMH significant for type 1 DM on insulin pump, ESRD on HD among multiple medical conditions listed in [facility]. Patient presented to the ED with altered mental status.  It was reported that the patient had been last seen around 0930 AM on day of presentation. The patient's sister texted her several times but received no response.  Hence the sister called one of the patient's roommate and asked him to check on her.  She was found confused and incoherent. She was brought to the ED for further evaluation.   In the Ed the patient was found to be severely hyperglycemic with BS > 900. She was initially referred to the hospitalist team for admission. However her mental status worsened  so CCM team was asked to admit her. It was reported that the patient had episodes of nausea and vomiting en route to hospital. The nurses described her as incoherent but by the time PCCM was called to see her, she was unresponsive with episodes of grunting. She could not contribute to this consultation.   The patient's sister [name] who contributed to this consultation by phone reported that the patient has had a similar episode like this in the past.  [name] reported that it took her sister several day before she became coherent.  Pertinent  Medical History  T1DM on insulin pump ESRD on HD T/Th/S Previous DVT Hyperlipidemia HTN Diabetic neuropathy OSA PVD  Significant Hospital Events: Including procedures, antibiotic start and stop dates in addition to other pertinent events   12/22 admitted to ICU, patient intubated and started on insulin drip, had dialysis 12/23 off insulin gtt, still not awake failed SBT 12/24 extubated and required reinbuation due to cheynes stokes  respirations 12/25 got HD with 2.5L off. code stroke called in evening  Interim History / Subjective:  Sedated MRI with multiple findings-signal abnormality which may be in the context of a stroke or ischemic injury  Objective   Blood pressure (!) 108/45, pulse 75, temperature 98.1 F (36.7 C), temperature source Oral, resp. rate 11, height 5\' 6"  (1.676 m), weight 61.5 kg, SpO2 98 %.    Vent Mode: PRVC FiO2 (%):  [60 %-80 %] 60 % Set Rate:  [12 bmp] 12 bmp Vt Set:  [480 mL] 480 mL PEEP:  [5 cmH20] 5 cmH20 Plateau Pressure:  [14 cmH20-26 cmH20] 14 cmH20   Intake/Output Summary (Last 24 hours) at 03/28/2021 0729 Last data filed at 03/28/2021 0600 Gross per 24 hour  Intake 462.86 ml  Output 200 ml  Net 262.86 ml   Filed Weights   03/26/21 0500 03/28/21 0319 03/28/21 0636  Weight: 60 kg 61.5 kg 61.5 kg    Examination: Gen:   Acutely ill-appearing HEENT: Endotracheal tube in place Lungs:    Decreased air entry bibasilarly CV:        Okay S1-S2 appreciated Abd:      Bowel sounds appreciated  Ext:    Edema Skin:      Warm and dry; no rashes Neuro:   Sedated  Labs/imaging reviewed: CT head findings noted MRI results reviewed CBC, Chem-7 reviewed  Resolved Hospital Problem list     Assessment & Plan:   Acute hypoxic respiratory failure secondary to acute pulmonary edema -Failed  extubation attempt on 1224 due to pulmonary edema, Cheyne-Stokes respirations -Remains on full vent support  Acute metabolic encephalopathy secondary to HHS Code stroke called 12/25-left gaze preference noted -CT with concerns of possible occipital infarct -MRI brain with multiple findings possibly ischemic//hypoxic vs acute infarcts -Neurology following  End-stage renal disease on hemodialysis -Unable to initiate dialysis today secondary to difficulty accessing fistula -Will need catheter placement -Continue to trend electrolytes  HHS Type 1 diabetes on insulin pump -On Semglee 5  units twice daily -Continue SSI  Hypertension -Hydralazine -Amlodipine -Sedation as tolerated  Hyperlipidemia -Pravastatin 40 mg daily  Anemia -Anemia of chronic disease  With MRI changes, inability to initiate dialysis this morning Not ready for weaning  Risk of decompensation remains high  Best Practice (right click and "Reselect all SmartList Selections" daily)   Diet/type: tubefeeds were held, will trickle back today DVT prophylaxis: prophylactic heparin  GI prophylaxis: PPI Lines: N/A Foley:  N/A Code Status:  full code Last date of multidisciplinary goals of care discussion [sister alicia updated 26/83 over the phone.]  Labs   CBC: Recent Labs  Lab 03/24/21 0231 03/25/21 0148 03/25/21 1717 03/26/21 0140 03/26/21 2339 03/27/21 0648 03/28/21 0100  WBC 3.4* 7.7  --  6.8  --  11.5* 14.7*  HGB 9.1* 10.0* 10.5* 8.6* 11.6* 9.7* 8.9*  HCT 29.2* 33.3* 31.0* 28.4* 34.0* 33.3* 29.9*  MCV 78.5* 81.6  --  82.6  --  82.2 81.0  PLT 164 144*  --  231  --  364 419    Basic Metabolic Panel: Recent Labs  Lab 03/23/21 1225 03/23/21 1902 03/23/21 2141 03/24/21 0231 03/24/21 0852 03/24/21 1653 03/24/21 2022 03/25/21 1717 03/26/21 0140 03/26/21 0932 03/26/21 2339 03/27/21 0648  NA 135 134* 134* 135   < > 134* 134* 134* 131* 133* 126* 134*  K 5.1 4.7 4.4 2.8*   < > 4.0 4.2 4.5 2.6* 5.0 5.4* 5.3*  CL 95* 93* 95* 98   < > 98 98  --  93* 96*  --  92*  CO2 26 25 25 29    < > 25 25  --  27 24  --  24  GLUCOSE 100* 123* 99 97   < > 120* 122*  --  86 279*  --  290*  BUN 52* 57* 58* 14   < > 31* 33*  --  9 28*  --  51*  CREATININE 7.15* 7.24* 7.35* 2.28*   < > 4.67* 4.82*  --  1.72* 4.48*  --  6.19*  CALCIUM 8.6* 8.7* 8.6* 7.3*   < > 8.4* 8.3*  --  6.9* 8.1*  --  8.4*  MG 2.1 2.2 2.1 1.8  --  2.1  --   --   --   --   --  2.8*  PHOS 6.9* 7.2* 7.4* 2.5  --  5.6*  --   --   --   --   --   --    < > = values in this interval not displayed.   GFR: Estimated Creatinine  Clearance: 10.3 mL/min (A) (by C-G formula based on SCr of 6.19 mg/dL (H)). Recent Labs  Lab 03/08/2021 1844 03/12/2021 2044 03/23/21 0128 03/25/21 0148 03/26/21 0140 03/27/21 0648 03/28/21 0100  WBC  --   --    < > 7.7 6.8 11.5* 14.7*  LATICACIDVEN 2.0* 2.0*  --   --   --   --   --    < > = values  in this interval not displayed.    Liver Function Tests: Recent Labs  Lab 03/09/2021 1840  AST 63*  ALT 47*  ALKPHOS 383*  BILITOT 1.1  PROT 7.7  ALBUMIN 3.5   No results for input(s): LIPASE, AMYLASE in the last 168 hours. No results for input(s): AMMONIA in the last 168 hours.  ABG    Component Value Date/Time   PHART 7.245 (L) 03/26/2021 2339   PCO2ART 41.9 03/26/2021 2339   PO2ART 56 (L) 03/26/2021 2339   HCO3 18.2 (L) 03/26/2021 2339   TCO2 19 (L) 03/26/2021 2339   ACIDBASEDEF 9.0 (H) 03/26/2021 2339   O2SAT 84.0 03/26/2021 2339     Coagulation Profile: No results for input(s): INR, PROTIME in the last 168 hours.  Cardiac Enzymes: No results for input(s): CKTOTAL, CKMB, CKMBINDEX, TROPONINI in the last 168 hours.  HbA1C: Hemoglobin A1C  Date/Time Value Ref Range Status  11/02/2020 10:26 AM 8.1 (A) 4.0 - 5.6 % Final  02/17/2020 01:32 PM 9.4 (A) 4.0 - 5.6 % Final  10/22/2019 12:00 AM 8.4  Final   Hgb A1c MFr Bld  Date/Time Value Ref Range Status  03/23/2021 04:44 AM 7.8 (H) 4.8 - 5.6 % Final    Comment:    (NOTE) Pre diabetes:          5.7%-6.4%  Diabetes:              >6.4%  Glycemic control for   <7.0% adults with diabetes   07/30/2018 01:10 AM 10.4 (H) 4.8 - 5.6 % Final    Comment:    (NOTE) Pre diabetes:          5.7%-6.4% Diabetes:              >6.4% Glycemic control for   <7.0% adults with diabetes     CBG: Recent Labs  Lab 03/27/21 1518 03/27/21 1903 03/27/21 2303 03/28/21 0302 03/28/21 0715  GLUCAP 119* 118* 232* 254* 224*   The patient is critically ill with multiple organ systems failure and requires high complexity decision  making for assessment and support, frequent evaluation and titration of therapies, application of advanced monitoring technologies and extensive interpretation of multiple databases. Critical Care Time devoted to patient care services described in this note independent of APP/resident time (if applicable)  is 35 minutes.   Sherrilyn Rist MD [facility] Personal pager: See Amion If unanswered, please page CCM On-call: [phone]

## 2021-03-28 NOTE — Progress Notes (Signed)
Inpatient Diabetes Program Recommendations  AACE/ADA: New Consensus Statement on Inpatient Glycemic Control (2015)  Target Ranges:  Prepandial:   less than 140 mg/dL      Peak postprandial:   less than 180 mg/dL (1-2 hours)      Critically ill patients:  140 - 180 mg/dL   Lab Results  Component Value Date   GLUCAP 224 (H) 03/28/2021   HGBA1C 7.8 (H) 03/23/2021    Review of Glycemic Control  Diabetes history: DM1 Outpatient Diabetes medications: Insulin pump Current orders for Inpatient glycemic control: Levemir 5 units BID, Novolog 0-6 units Q4H When TFs restart, will need TF coverage.  Inpatient Diabetes Program Recommendations:    Consider Novolog 2 units Q4H for TF coverage. Do not give if TF held for any reason.  Follow daily.  Thank you. Lorenda Peck, RD, LDN, CDE Inpatient Diabetes Coordinator [phone]

## 2021-03-28 NOTE — Progress Notes (Addendum)
Hemodialysis- With 7 minutes remaining HR brady into 40-50s, bp drop into 88Q systolic. Blood was immediately returned with additional 200cc saline bolus. Did not respond to bolus. Given additional 300cc saline bolus for total of 0.5L. ICU rn at bedside and starting pressor support. RT suctioning  1725-HR back to baseline, bp stable. ICU team at bedside  Net UF with HD 1.5L due to emergent bolus. Reported to ICU team.

## 2021-03-28 NOTE — Progress Notes (Signed)
Developed hypotension and hypoxemia resolved with pressors, volume and bagging.  Lungs clear, suspect hypoxemia related to poor forward flow from loss of preload, would be less aggressive with fluid removal.  Erskine Emery MD [facility]

## 2021-03-28 NOTE — Procedures (Addendum)
Patient Name: [name]  MRN: [id number]  Epilepsy Attending: Lora Havens  Referring Physician/Provider: Dr Donnetta Simpers Duration: 03/27/2021 2222 to 03/28/2021 2222   Patient history: 50 y.o. female with hx of Type 1DM, ESRD on HD, prior DVTs, HLD, HTN, OSA, PVD who is admitted with poor responsiveness in the setting of significant hyperglycemia. EEG to evaluate for seizure   Level of alertness: lethargic   AEDs during EEG study: None   Technical aspects: This EEG study was done with scalp electrodes positioned according to the 10-20 International system of electrode placement. Electrical activity was acquired at a sampling rate of 500Hz  and reviewed with a high frequency filter of 70Hz  and a low frequency filter of 1Hz . EEG data were recorded continuously and digitally stored.    Description: EEG initially showed continuous generalized and lateralized right hemisphere polymorphic sharply contoured 3-6hz  theta-delta slowing admixed with 3-4 seconds of generalized EEG attenuation.  After around 1530 on 03/28/2021, EEG showed predominantly background attenuation admixed with intermittent bilateral frontal 3 to 6 Hz-delta slowing which gradually evolved into continuous generalized background suppression.   Hyperventilation and photic stimulation were not performed.    ABNORMALITY - Continuous slow, generalized and lateralized right hemisphere -  Background suppression, generalized     IMPRESSION: This study was initially suggestive of cortical dysfunction arising from right hemisphere which could be secondary to underlying structural abnormality. Additionally there is moderate to severe diffuse encephalopathy, nonspecific etiology.   After around 1530 on 03/23/2019, EEG worsened and was suggestive of profound diffuse encephalopathy, nonspecific to etiology.  No seizures or definite epileptiform discharges were seen throughout the recording.  Dr Erlinda Hong was notified    [name]

## 2021-03-28 NOTE — Progress Notes (Addendum)
STROKE TEAM PROGRESS NOTE   INTERVAL HISTORY Her dialysis RN is at the bedside.  Pt intubated not responsive, on sedation, no brainstem reflexes. On LTM EEG but severe encephalopathy and no seizure. MRI showed right hemisphere cortical sulcus hyperintensity on T2 FLAIR, nonspecific but not typical pattern for stroke, more likely represent severe cytotoxic injury.   Vitals:   03/28/21 0600 03/28/21 0636 03/28/21 0715 03/28/21 0750  BP: (!) 103/39 (!) 108/45  (!) 113/44  Pulse: 73 75  83  Resp: 12 11    Temp:   99.3 F (37.4 C)   TempSrc:   Axillary   SpO2: 99% 98%    Weight:  61.5 kg    Height:       CBC:  Recent Labs  Lab 03/27/21 0648 03/28/21 0100  WBC 11.5* 14.7*  HGB 9.7* 8.9*  HCT 33.3* 29.9*  MCV 82.2 81.0  PLT 364 035   Basic Metabolic Panel:  Recent Labs  Lab 03/24/21 0231 03/24/21 0852 03/24/21 1653 03/24/21 2022 03/26/21 0932 03/26/21 2339 03/27/21 0648  NA 135   < > 134*   < > 133* 126* 134*  K 2.8*   < > 4.0   < > 5.0 5.4* 5.3*  CL 98   < > 98   < > 96*  --  92*  CO2 29   < > 25   < > 24  --  24  GLUCOSE 97   < > 120*   < > 279*  --  290*  BUN 14   < > 31*   < > 28*  --  51*  CREATININE 2.28*   < > 4.67*   < > 4.48*  --  6.19*  CALCIUM 7.3*   < > 8.4*   < > 8.1*  --  8.4*  MG 1.8  --  2.1  --   --   --  2.8*  PHOS 2.5  --  5.6*  --   --   --   --    < > = values in this interval not displayed.   HgbA1c:  Recent Labs  Lab 03/23/21 0444  HGBA1C 7.8*   Urine Drug Screen: No results for input(s): LABOPIA, COCAINSCRNUR, LABBENZ, AMPHETMU, THCU, LABBARB in the last 168 hours.  Alcohol Level  Recent Labs  Lab 03/19/2021 1955  ETH <10    IMAGING past 24 hours MR BRAIN WO CONTRAST  Result Date: 03/27/2021 CLINICAL DATA:  Stroke, follow-up. Additional history provided: Patient found altered with hyperglycemia. EXAM: MRI HEAD WITHOUT CONTRAST TECHNIQUE: Multiplanar, multiecho pulse sequences of the brain and surrounding structures were obtained  without intravenous contrast. COMPARISON:  Noncontrast head CT and CT angiogram head/neck 03/26/2021. Brain MRI 11/24/2020. FINDINGS: Brain: There is extensive cortically-based diffusion-weighted signal abnormality and corresponding T2 FLAIR hyperintense signal abnormality within the right cerebral hemisphere with involvement of the right ACA, right MCA and right PCA territories. Similar signal changes are also present within the left parietal and occipital lobes. Additionally, there is a subcentimeter focus of diffusion-weighted signal abnormality within the superomedial left cerebellar hemisphere (series 2, image 20). These findings are nonspecific but may reflect acute infarcts, sequela of acute hypoxic/ischemic injury, seizure-related signal changes or a combination there of. There is no significant mass effect at this time. Mild chronic small vessel ischemic changes within the cerebral white matter. Redemonstrated chronic small-vessel infarct within the left centrum semiovale/corona radiata. Chronic microhemorrhages versus mineralization within the cerebellar dentate nuclei, bilaterally. No evidence of  an intracranial mass. No extra-axial fluid collection. No midline shift. Vascular: Maintained flow voids within the proximal large arterial vessels. Skull and upper cervical spine: No focal suspicious marrow lesion. Sinuses/Orbits: Visualized orbits show no acute finding. Bilateral lens replacements. Mild mucosal thickening within the bilateral ethmoid, sphenoid and maxillary sinuses. Other: Bilateral mastoid effusions. Fluid layering dependently within the nasopharynx. IMPRESSION: Diffusion-weighted and T2 FLAIR hyperintense signal abnormality within the right cerebral hemisphere, left parietal and occipital lobes and superomedial left cerebellar hemisphere, as described. Involvement of the right cerebral hemisphere is extensive. These signal changes are nonspecific but may reflect acute infarcts, acute  hypoxic/ischemic injury, seizure-related signal changes or a combination thereof. No significant mass effect at this time. Mild chronic small vessel ischemic changes within the cerebral white matter and pons. Redemonstrated chronic small-vessel infarct within the left centrum semiovale/corona radiata. Mild paranasal sinus disease, as outlined. Bilateral mastoid effusions. Electronically Signed   By: Kellie Simmering D.O.   On: 03/27/2021 21:38    PHYSICAL EXAM  Temp:  [97.6 F (36.4 C)-99.5 F (37.5 C)] 98.6 F (37 C) (12/27 1512) Pulse Rate:  [73-108] 105 (12/27 1633) Resp:  [9-21] 12 (12/27 1633) BP: (99-158)/(38-75) 139/75 (12/27 1630) SpO2:  [92 %-100 %] 100 % (12/27 1633) FiO2 (%):  [50 %-60 %] 60 % (12/27 1429) Weight:  [61.5 kg] 61.5 kg (12/27 1338)  General - Well nourished, well developed, intubated on sedation.  Ophthalmologic - fundi not visualized due to noncooperation.  Cardiovascular - Regular rate and rhythm.  Neuro - intubated on fentanyl, eyes half way open, not following commands. Eyes in mid position, not blinking to visual threat, doll's eyes absent, not tracking, pupils 16mm, not reactive to light. Corneal reflex absent, gag and cough absent. Breathing over the vent.  Facial symmetry not able to test due to ET tube.  Tongue protrusion not cooperative. On pain stimulation, no movement in all extremities. B/l no babinski. Sensation, coordination and gait not tested.    ASSESSMENT/PLAN [name] is a 50 y.o. female with history of type 1 DM with insulin pump, ESRD on HD, DVTs, HTN, HLD, OSA, PVD presenting with HHS coma, respiratory failure. Intubated in ED, however still moving extremities. On 12/25 pt was found to have L gaze and unreactive pupils and not moving extremities.  Cytotoxic brain injury / metabolic encephalopathy - likely related to HHS  CT head No acute abnormality but questionable right occipital hypodensity CTA head & neck unremarkable MRI  brain Diffusion-weighted and T2 FLAIR hyperintense signal abnormality within the right cerebral hemisphere, left parietal and occipital lobes and superomedial left cerebellar hemisphere. The involvement of the right cerebral hemisphere is extensive. These signal changes are nonspecific but may reflect acute infarcts, acute hypoxic/ischemic injury, seizure-related signal changes or a combination thereof. 2D Echo EF 60-65% LTM EEG severe encephalopathy, no seizure LDL pending HgbA1c 7.8 VTE prophylaxis - heparin subq aspirin 81 mg daily prior to admission, now on aspirin 162 mg daily.  Therapy recommendations:  pending Disposition:  pending  Respiratory failure Pulmonary edema [place]  Intubated On fentanyl On HD CXR left lung base pneumonia On Cefepime Sputum culture pending CCM on board Vent management per primary team  DM I on insulin pump HHS coma AMS -> unresponsive Glucose on admission > 600 On levemir now HgbA1c 7.8, goal < 7.0 CBGs SSI Glucose stable now Management per primary atem  Hypertension Home meds:  amlodipine Stable On home norvasc 5 Long-term BP goal normotensive  Hyperlipidemia Home meds:  pravastatin 40 LDL pending, goal < 70 Now on pravastatin 40 Continue statin at discharge  Dysphagia  On TF @ 45  Other Stroke Risk Factors PVD OSA Hx of DVTs  Other Active Problems ESRD on HD Leukocytosis WBC 14.7 Hyperglycemia K 5.3  Hospital day # 5  This patient is critically ill due to severe encephalopathy, HHS coma, respiratory failure, hyperglycemia, ESRD on HD and at significant risk of neurological worsening, death form respiratory failure, heart failure, renal failure. This patient's care requires constant monitoring of vital signs, hemodynamics, respiratory and cardiac monitoring, review of multiple databases, neurological assessment, discussion with family, other specialists and medical decision making of high complexity. I spent 40 minutes  of neurocritical care time in the care of this patient. I discussed with Dr. Ander Slade CCM  Rosalin Hawking, MD PhD Stroke Neurology 03/28/2021 5:00 PM   To contact Stroke Continuity provider, please refer to [email or web address] After hours, contact General Neurology

## 2021-03-28 NOTE — Progress Notes (Signed)
Bibasal infiltrates on chest x-ray  Leukocytosis, temperature 100.2 on 12/26  MRSA PCR negative on 22nd  Possible pneumonia Start on empiric cefepime Tracheal aspirate for cultures  HD cath line can be used

## 2021-03-28 NOTE — Progress Notes (Signed)
[name] Progress Note    Subjective:   Pt seen in ICU, on vent and not responding, on fentanyl gtt   Objective:   BP (!) 113/44    Pulse 84    Temp 99.3 F (37.4 C) (Axillary)    Resp (!) 21    Ht 5' 6" (1.676 m)    Wt 61.5 kg    SpO2 99%    BMI 21.88 kg/m   Intake/Output Summary (Last 24 hours) at 03/28/2021 1139 Last data filed at 03/28/2021 0600 Gross per 24 hour  Intake 442.86 ml  Output --  Net 442.86 ml    Weight change:   Physical Exam: Gen: sedated CVS:s1s2, rrr Resp:intubated, cta bl ILO:KPWX Ext: b/l UE edema, +dependent edema b/l thighs Neuro: sedated Dialysis access: LUE AVF +b/t  OP HD: [place] [facility]    4h  57.5kg   400/500  2/2 bath  LUE AVF  Hep none   Hectoral 11 mcg IV/HD Micera 225 mcg every 2 weeks (last given 03/09/21 Venofer  50 mg IV every Tuesday  Assessment/ Plan  Hyperosmolar Hyperglycemia Syndrome - s/p insulin gtt. Per primary service Fevers/ [facility] - possible bibasilar pna by repeat CXR today  Acute metabolic encephalopathy - due to HHS coma VDRF - extubated but had to be reintubated  ESRD - on HD TTS. Plan HD today.  HD access - L AVF unable to cannulate today, temp cath placed L IJ today 12/27.   Hypertension/volume  - up 3-4 kg , UF 3 L w/ HD as tolerated. CXR's show no edema.   Anemia  - stable, continue with ESA  Metabolic bone disease -  npo for now  Nutrition - npo for now    Kelly Splinter, MD 03/28/2021, 11:39 AM      Labs: BMET Recent Labs  Lab 03/23/21 0444 03/23/21 4688 03/23/21 1225 03/23/21 1902 03/23/21 2141 03/24/21 0231 03/24/21 7373 03/24/21 1226 03/24/21 1653 03/24/21 2022 03/25/21 1717 03/26/21 0140 03/26/21 0932 03/26/21 2339 03/27/21 0648  NA 132* 135 135 134* 134* 135 133* 132* 134* 134* 134* 131* 133* 126* 134*  K 4.8 5.2* 5.1 4.7 4.4 2.8* 5.0 6.2* 4.0 4.2 4.5 2.6* 5.0 5.4* 5.3*  CL 92* 94* 95* 93* 95* 98 99 97* 98 98  --  93* 96*  --  92*  CO2 _0 --  27 24  --  24  GLUCOSE 186* 123* 100* 123* 99 97 104* 119* 120* 122*  --  86 279*  --  290*  BUN 50* 53* 52* 57* 58* 14 27* 29* 31* 33*  --  9 28*  --  51*  CREATININE 6.86* 7.02* 7.15* 7.24* 7.35* 2.28* 4.35* 4.46* 4.67* 4.82*  --  1.72* 4.48*  --  6.19*  CALCIUM 8.5* 8.5* 8.6* 8.7* 8.6* 7.3* 8.0* 8.1* 8.4* 8.3*  --  6.9* 8.1*  --  8.4*  PHOS 7.2* 6.9* 6.9* 7.2* 7.4* 2.5  --   --  5.6*  --   --   --   --   --   --     CBC Recent Labs  Lab 03/25/21 0148 03/25/21 1717 03/26/21 0140 03/26/21 2339 03/27/21 0648 03/28/21 0100  WBC 7.7  --  6.8  --  11.5* 14.7*  HGB 10.0*   < > 8.6* 11.6* 9.7* 8.9*  HCT 33.3*   < > 28.4* 34.0* 33.3* 29.9*  MCV 81.6  --  82.6  --  82.2 81.0  PLT 144*  --  231  --  364 328   < > = values in this interval not displayed.     Medications:     amLODipine  5 mg Per Tube Daily   aspirin  162 mg Per Tube Daily   chlorhexidine gluconate (MEDLINE KIT)  15 mL Mouth Rinse BID   Chlorhexidine Gluconate Cloth  6 each Topical Q0600   collagenase   Topical Daily   darbepoetin (ARANESP) injection - DIALYSIS  40 mcg Intravenous Q Thu-HD   docusate  100 mg Per Tube BID   fentaNYL (SUBLIMAZE) injection  50 mcg Intravenous Once   folic acid  1 mg Per Tube Daily   heparin  5,000 Units Subcutaneous Q8H   insulin aspart  0-6 Units Subcutaneous Q4H   insulin detemir  5 Units Subcutaneous BID   mouth rinse  15 mL Mouth Rinse 10 times per day   polyethylene glycol  17 g Per Tube Daily   pravastatin  40 mg Per Tube [id number]

## 2021-03-28 NOTE — Progress Notes (Signed)
Hemodialysis- Patient unable to be cannulated this am. Access arm is edematous. Attempted with 15g needles x3 and then 16g needles x2 with 2 RNs. Notified. Dr. Jonnie Finner.

## 2021-03-28 NOTE — Progress Notes (Signed)
LTM maintain done. P4, C4, Cz reapplied. No skin breakdown noted. Results pending.

## 2021-03-28 NOTE — Progress Notes (Signed)
PT Cancellation Note  Patient Details Name: MINIE ROADCAP MRN: [id number] DOB: Sep 21, 1970   Cancelled Treatment:    Reason Eval/Treat Not Completed: Medical issues which prohibited therapy Reviewed chart and spoke with nursing.  Pt still not medically ready for PT - intubated, sedated, unable to get HD today.  Will f/u as able.  Abran Richard, PT Acute Rehab Services Pager [phone] [facility] [place] 03/28/2021, 10:43 AM

## 2021-03-28 NOTE — Progress Notes (Signed)
Echocardiogram 2D Echocardiogram has been performed.  Oneal Deputy Dalicia Kisner RDCS 03/28/2021, 11:11 AM

## 2021-03-29 ENCOUNTER — Encounter (HOSPITAL_BASED_OUTPATIENT_CLINIC_OR_DEPARTMENT_OTHER): Payer: Medicare HMO | Admitting: Internal Medicine

## 2021-03-29 ENCOUNTER — Inpatient Hospital Stay (HOSPITAL_COMMUNITY): Payer: Medicare HMO

## 2021-03-29 LAB — LIPID PANEL
Cholesterol: 70 mg/dL (ref 0–200)
HDL: 12 mg/dL — ABNORMAL LOW (ref >40)
LDL Cholesterol: 32 mg/dL (ref 0–99)
Total CHOL/HDL Ratio: 5.8 RATIO
Triglycerides: 129 mg/dL (ref <150)
VLDL: 26 mg/dL (ref 0–40)

## 2021-03-29 LAB — BASIC METABOLIC PANEL
Anion gap: 11 (ref 5–15)
BUN: 35 mg/dL — ABNORMAL HIGH (ref 6–20)
CO2: 27 mmol/L (ref 22–32)
Calcium: 8.4 mg/dL — ABNORMAL LOW (ref 8.9–10.3)
Chloride: 96 mmol/L — ABNORMAL LOW (ref 98–111)
Creatinine, Ser: 4.09 mg/dL — ABNORMAL HIGH (ref 0.44–1.00)
GFR, Estimated: 13 mL/min — ABNORMAL LOW (ref >60)
Glucose, Bld: 224 mg/dL — ABNORMAL HIGH (ref 70–99)
Potassium: 5.2 mmol/L — ABNORMAL HIGH (ref 3.5–5.1)
Sodium: 134 mmol/L — ABNORMAL LOW (ref 135–145)

## 2021-03-29 LAB — CBC
HCT: 28.3 % — ABNORMAL LOW (ref 36.0–46.0)
Hemoglobin: 8.8 g/dL — ABNORMAL LOW (ref 12.0–15.0)
MCH: 24.6 pg — ABNORMAL LOW (ref 26.0–34.0)
MCHC: 31.1 g/dL (ref 30.0–36.0)
MCV: 79.3 fL — ABNORMAL LOW (ref 80.0–100.0)
Platelets: 340 10*3/uL (ref 150–400)
RBC: 3.57 MIL/uL — ABNORMAL LOW (ref 3.87–5.11)
RDW: 17.8 % — ABNORMAL HIGH (ref 11.5–15.5)
WBC: 16 10*3/uL — ABNORMAL HIGH (ref 4.0–10.5)
nRBC: 0 % (ref 0.0–0.2)

## 2021-03-29 LAB — GLUCOSE, CAPILLARY
Glucose-Capillary: 188 mg/dL — ABNORMAL HIGH (ref 70–99)
Glucose-Capillary: 211 mg/dL — ABNORMAL HIGH (ref 70–99)
Glucose-Capillary: 291 mg/dL — ABNORMAL HIGH (ref 70–99)
Glucose-Capillary: 212 mg/dL — ABNORMAL HIGH (ref 70–99)
Glucose-Capillary: 214 mg/dL — ABNORMAL HIGH (ref 70–99)
Glucose-Capillary: 181 mg/dL — ABNORMAL HIGH (ref 70–99)

## 2021-03-29 LAB — MAGNESIUM: Magnesium: 2.4 mg/dL (ref 1.7–2.4)

## 2021-03-29 MED ORDER — PROSOURCE TF PO LIQD
45.0000 mL | Freq: Every day | ORAL | Status: DC
  Administered 2021-03-29 – 2021-03-31 (×3): 45 mL
  Filled 2021-03-29 (×3): qty 45

## 2021-03-29 MED ORDER — PANTOPRAZOLE 2 MG/ML SUSPENSION
40.0000 mg | Freq: Every day | ORAL | Status: DC
  Administered 2021-03-29 – 2021-03-31 (×3): 40 mg
  Filled 2021-03-29 (×3): qty 20

## 2021-03-29 MED ORDER — NEPRO/CARBSTEADY PO LIQD
50.0000 mL/h | ORAL | Status: DC
  Filled 2021-03-29 (×4): qty 237

## 2021-03-29 MED ORDER — VITAL 1.5 CAL PO LIQD: 1000.0000 mL | ORAL | Status: DC

## 2021-03-29 MED ORDER — SODIUM ZIRCONIUM CYCLOSILICATE 10 G PO PACK
10.0000 g | PACK | Freq: Three times a day (TID) | ORAL | Status: AC
  Administered 2021-03-29 (×2): 10 g
  Filled 2021-03-29 (×2): qty 1

## 2021-03-29 MED ORDER — SODIUM CHLORIDE 0.9% FLUSH
10.0000 mL | Freq: Two times a day (BID) | INTRAVENOUS | Status: DC
  Administered 2021-03-29: 10:00:00 40 mL
  Administered 2021-03-29 – 2021-03-30 (×2): 10 mL
  Administered 2021-03-30: 10:00:00 40 mL
  Administered 2021-03-31: 10:00:00 10 mL

## 2021-03-29 MED ORDER — SODIUM CHLORIDE 0.9% FLUSH
10.0000 mL | INTRAVENOUS | Status: DC | PRN
  Administered 2021-03-30: 22:00:00 10 mL

## 2021-03-29 MED ORDER — RENA-VITE PO TABS
1.0000 | ORAL_TABLET | Freq: Every day | ORAL | Status: DC
  Administered 2021-03-29 – 2021-03-30 (×2): 1
  Filled 2021-03-29 (×2): qty 1

## 2021-03-29 MED ORDER — NEPRO/CARBSTEADY PO LIQD
1000.0000 mL | ORAL | Status: DC
  Administered 2021-03-29 – 2021-03-30 (×3): 1000 mL
  Filled 2021-03-29 (×3): qty 1000

## 2021-03-29 NOTE — Progress Notes (Signed)
OT Cancellation Note  Patient Details Name: ISYS TIETJE MRN: [id number] DOB: [birth year]   Cancelled Treatment:    Reason Eval/Treat Not Completed: Patient remains not medically ready. Will sign off for now.   Tyrone Schimke, OT Acute Rehabilitation Services Office: [phone]  03/29/2021, 11:55 AM

## 2021-03-29 NOTE — Progress Notes (Signed)
Nutrition Follow-up  DOCUMENTATION CODES:   Not applicable  INTERVENTION:   Continue tube feeding via OG tube: Change to Vital 1.5 at 50 ml/h (1200 ml per day) Prosource TF 45 ml once daily  Provides 1840 kcal, 92 gm protein, 917 ml free water daily.  Change to renal MVI daily via tube.  NUTRITION DIAGNOSIS:   Inadequate oral intake related to inability to eat as evidenced by NPO status.  Ongoing  GOAL:   Patient will meet greater than or equal to 90% of their needs  Met with TF  MONITOR:   Vent status, Labs, TF tolerance  REASON FOR ASSESSMENT:   Ventilator, Consult Enteral/tube feeding initiation and management  ASSESSMENT:   50 yo female admitted with AMS, hyperosmolar hyperglycemia syndrome, glucose > 900 on admission. PMH includes type 1 DM on insulin pump, ESRD on HD, HTN, HLD, PVD, R TMA, L toe amputation.  Discussed patient in ICU rounds and with RN today. Currently receiving Vital High Protein at 45 ml/h, tolerating well. Propofol has been discontinued, so RD will adjust TF regimen to better meet nutrition needs.  EEG ongoing; burst suppression.  Plans for HD today.   Patient remains intubated on ventilator support MV: 9 L/min Temp (24hrs), Avg:98.8 F (37.1 C), Min:97.9 F (36.6 C), Max:99.4 F (37.4 C)   Labs reviewed. Na 134, K 5.2, phos 6.9, A1C 7.8 CBG: 188-211  Medications reviewed and include Aranesp, folic acid, Novolog, Levemir.  Admission weight 57.4 kg Current weight 59.3 kg I/O net +6.1 L since admission + edema  Diet Order:   Diet Order             Diet NPO time specified  Diet effective now                   EDUCATION NEEDS:   Not appropriate for education at this time  Skin:  Skin Assessment: Skin Integrity Issues: Skin Integrity Issues:: Diabetic Ulcer Diabetic Ulcer: L pretibial, R foot  Last BM:  12/27 type 5  Height:   Ht Readings from Last 1 Encounters:  03/23/21 _0  (1.676 m)    Weight:   Wt  Readings from Last 1 Encounters:  03/29/21 59.3 kg     BMI:  Body mass index is 21.1 kg/m.  Estimated Nutritional Needs:   Kcal:  1600-1800  Protein:  90-100 gm  Fluid:  >/= 1.8 L    Lucas Mallow, RD, LDN, CNSC Please refer to Amion for contact information.

## 2021-03-29 NOTE — Progress Notes (Signed)
LTM EEG discontinued - no skin breakdown at unhook.   

## 2021-03-29 NOTE — Progress Notes (Signed)
PT Cancellation Note  Patient Details Name: [name] MRN: [id number] DOB: Aug 11, 1970   Cancelled Treatment:    Reason Eval/Treat Not Completed: Patient not medically ready Pt remains unresponsive, intubated, and not able to participate with therapy with poor prognosis.  At this time, will sign off PT. If pt's status changes - please reconsult.  Thank you. Abran Richard, PT Acute Rehab Services Pager [phone] [facility] [phone]   Karlton Lemon 03/29/2021, 2:08 PM

## 2021-03-29 NOTE — Progress Notes (Addendum)
[facility] Progress Note    Subjective:   Off all sedating meds, not waking up. Had hypotensive episode last 10 min of HD yest    Objective:   BP 127/60    Pulse 88    Temp 98.6 F (37 C) (Oral)    Resp 15    Ht $R'5\' 6"'PF$  (1.676 m)    Wt 59.3 kg    SpO2 95%    BMI 21.10 kg/m   Intake/Output Summary (Last 24 hours) at 03/29/2021 1111 Last data filed at 03/29/2021 0700 Gross per 24 hour  Intake 1234.82 ml  Output 2189 ml  Net -954.18 ml    Weight change: 0 kg  Physical Exam: Gen: not responsive, on vent CVS:s1s2, rrr Resp:intubated, cta bl WCB:JSEG Ext: 1+ bilat UE edema, no LE edema Neuro: sedated Dialysis access: LUE AVF +b/t  OP HD: [place] [facility]    4h  57.5kg   400/500  2/2 bath  LUE AVF  Hep none   Hectoral 11 mcg IV/HD Micera 225 mcg every 2 weeks (last given 03/09/21 Venofer  50 mg IV every Tuesday  Assessment/ Plan  Hyperosmolar Hyperglycemia Syndrome - s/p insulin gtt. Per primary service Fevers/ [facility] - poss bibasilar pna on 12/27 CXR. Started IV abx per CCM  Acute metabolic encephalopathy - not sure etiology, w/u in progress VDRF - extubated but had to be reintubated  ESRD - on HD TTS. HD tomorrow.  Hyperkalemia - mild 5.1, changing TF's to Nepro, give lokelma today x 2, HD tomorrow.  HD access - L AVF unable to cannulate today, temp cath placed L IJ today 12/27.   Hypertension/volume - 2 kg + today, last CXR no edema. BP's normal w/ low DBP's will dc norvasc 5 mg qd.   Anemia  - stable, continue with ESA  Metabolic bone disease -  no binders for now, cont vdra    Rob Jonnie Finner, MD 03/28/2021, 11:39 AM      Labs: BMET Recent Labs  Lab 03/23/21 0444 03/23/21 3151 03/23/21 1225 03/23/21 1902 03/23/21 2141 03/24/21 0231 03/24/21 7616 03/24/21 1226 03/24/21 1653 03/24/21 2022 03/25/21 1717 03/26/21 0140 03/26/21 0932 03/26/21 2339 03/27/21 0648 03/29/21 0357  NA 132* 135 135 134* 134* 135   < > 132* 134* 134* 134* 131* 133* 126*  134* 134*  K 4.8 5.2* 5.1 4.7 4.4 2.8*   < > 6.2* 4.0 4.2 4.5 2.6* 5.0 5.4* 5.3* 5.2*  CL 92* 94* 95* 93* 95* 98   < > 97* 98 98  --  93* 96*  --  92* 96*  CO2 $Re'24 27 26 25 25 29   'OgR$ < > $R'23 25 25  'ej$ --  27 24  --  24 27  GLUCOSE 186* 123* 100* 123* 99 97   < > 119* 120* 122*  --  86 279*  --  290* 224*  BUN 50* 53* 52* 57* 58* 14   < > 29* 31* 33*  --  9 28*  --  51* 35*  CREATININE 6.86* 7.02* 7.15* 7.24* 7.35* 2.28*   < > 4.46* 4.67* 4.82*  --  1.72* 4.48*  --  6.19* 4.09*  CALCIUM 8.5* 8.5* 8.6* 8.7* 8.6* 7.3*   < > 8.1* 8.4* 8.3*  --  6.9* 8.1*  --  8.4* 8.4*  PHOS 7.2* 6.9* 6.9* 7.2* 7.4* 2.5  --   --  5.6*  --   --   --   --   --   --   --    < > =  values in this interval not displayed.    CBC Recent Labs  Lab 03/26/21 0140 03/26/21 2339 03/27/21 0648 03/28/21 0100 03/29/21 0357  WBC 6.8  --  11.5* 14.7* 16.0*  HGB 8.6* 11.6* 9.7* 8.9* 8.8*  HCT 28.4* 34.0* 33.3* 29.9* 28.3*  MCV 82.6  --  82.2 81.0 79.3*  PLT 231  --  364 328 340     Medications:     amLODipine  5 mg Per Tube Daily   aspirin  162 mg Per Tube Daily   chlorhexidine gluconate (MEDLINE KIT)  15 mL Mouth Rinse BID   Chlorhexidine Gluconate Cloth  6 each Topical Q0600   collagenase   Topical Daily   darbepoetin (ARANESP) injection - DIALYSIS  40 mcg Intravenous Q Thu-HD   docusate  100 mg Per Tube BID   feeding supplement (PROSource TF)  45 mL Per Tube Daily   fentaNYL (SUBLIMAZE) injection  50 mcg Intravenous Once   folic acid  1 mg Per Tube Daily   heparin  5,000 Units Subcutaneous Q8H   insulin aspart  0-6 Units Subcutaneous Q4H   insulin detemir  5 Units Subcutaneous BID   mouth rinse  15 mL Mouth Rinse 10 times per day   multivitamin  1 tablet Per Tube QHS   polyethylene glycol  17 g Per Tube Daily   pravastatin  40 mg Per Tube q1800   sodium chloride flush  10-40 mL Intracatheter Q12H

## 2021-03-29 NOTE — Progress Notes (Signed)
Pt transported to CT via vent. No complication noted.

## 2021-03-29 NOTE — Progress Notes (Signed)
EEG maintenance performed.  No skin breakdown observed at electrode sites Cz, C4, P3.

## 2021-03-29 NOTE — Procedures (Addendum)
Patient Name: [name]  MRN: [id number]  Epilepsy Attending: Lora Havens  Referring Physician/Provider: Dr Donnetta Simpers Duration: 03/27/2021 2222 to 03/29/2021 1602   Patient history: 50 y.o. female with hx of Type 1DM, ESRD on HD, prior DVTs, HLD, HTN, OSA, PVD who is admitted with poor responsiveness in the setting of significant hyperglycemia. EEG to evaluate for seizure   Level of alertness: lethargic   AEDs during EEG study: None   Technical aspects: This EEG study was done with scalp electrodes positioned according to the 10-20 International system of electrode placement. Electrical activity was acquired at a sampling rate of 500Hz  and reviewed with a high frequency filter of 70Hz  and a low frequency filter of 1Hz . EEG data were recorded continuously and digitally stored.    Description: EEG initially showed background suppression.  Gradually after around midnight EEG showed burst suppression pattern with bursts of 3 to 5 Hz theta-delta slowing lasting 2 to 3 seconds alternating with 3 to 6 seconds of EEG suppression.  Gradually the bursts appeared sharply contoured with triphasic morphology.  Hyperventilation and photic stimulation were not performed.    ABNORMALITY -  Burst suppression, generalized   IMPRESSION: This study is suggestive of profound diffuse encephalopathy, nonspecific to etiology. No seizures or definite epileptiform discharges were seen throughout the recording.   Dr Erlinda Hong was notified   [name]

## 2021-03-29 NOTE — Progress Notes (Signed)
STROKE TEAM PROGRESS NOTE   INTERVAL HISTORY Her RN is at the bedside.  Pt still intubated not responsive, off sedation since yesterday afternoon, however, no much improvement neurologically. EEG showed profound diffuse encephalopathy, less active than yesterday. Stat CT showed right hemisphere brain edema. Will d/c LTM EEG and repeat MRI. Updated sister over the phone and answered all her questions.   Vitals:   03/29/21 1547 03/29/21 1600 03/29/21 1700 03/29/21 1800  BP:  140/63 (!) 140/59 139/63  Pulse:  88 86 87  Resp:  18 18 13   Temp: 98.8 F (37.1 C)     TempSrc: Axillary     SpO2:  99% 98% 98%  Weight:      Height:       CBC:  Recent Labs  Lab 03/28/21 0100 03/29/21 0357  WBC 14.7* 16.0*  HGB 8.9* 8.8*  HCT 29.9* 28.3*  MCV 81.0 79.3*  PLT 328 782   Basic Metabolic Panel:  Recent Labs  Lab 03/24/21 0231 03/24/21 0852 03/24/21 1653 03/24/21 2022 03/27/21 0648 03/29/21 0357  NA 135   < > 134*   < > 134* 134*  K 2.8*   < > 4.0   < > 5.3* 5.2*  CL 98   < > 98   < > 92* 96*  CO2 29   < > 25   < > 24 27  GLUCOSE 97   < > 120*   < > 290* 224*  BUN 14   < > 31*   < > 51* 35*  CREATININE 2.28*   < > 4.67*   < > 6.19* 4.09*  CALCIUM 7.3*   < > 8.4*   < > 8.4* 8.4*  MG 1.8  --  2.1  --  2.8* 2.4  PHOS 2.5  --  5.6*  --   --   --    < > = values in this interval not displayed.   HgbA1c:  Recent Labs  Lab 03/23/21 0444  HGBA1C 7.8*   Urine Drug Screen: No results for input(s): LABOPIA, COCAINSCRNUR, LABBENZ, AMPHETMU, THCU, LABBARB in the last 168 hours.  Alcohol Level  Recent Labs  Lab 03/16/2021 1955  ETH <10    IMAGING past 24 hours CT HEAD WO CONTRAST (5MM)  Result Date: 03/29/2021 CLINICAL DATA:  Encephalopathy EXAM: CT HEAD WITHOUT CONTRAST TECHNIQUE: Contiguous axial images were obtained from the base of the skull through the vertex without intravenous contrast. COMPARISON:  Brain MRI from 2 days ago FINDINGS: Brain: Sulcal effacement and subtle  cortical low-density primarily seen in the right cerebral hemisphere, correlating with prior brain MRI. No hemorrhage, hydrocephalus, or collection. Vascular: Negative Skull: Sclerotic appearance which is likely from end-stage renal disease. Sinuses/Orbits: Bilateral mastoid opacification in the setting of intubation and nasopharyngeal fluid. IMPRESSION: Cortical edema best seen in the right cerebral hemisphere, reference preceding brain MRI. No hemorrhage or shift. Electronically Signed   By: Jorje Guild M.D.   On: 03/29/2021 09:50    PHYSICAL EXAM  Temp:  [98.6 F (37 C)-99.4 F (37.4 C)] 98.8 F (37.1 C) (12/28 1547) Pulse Rate:  [70-95] 87 (12/28 1800) Resp:  [10-28] 13 (12/28 1800) BP: (89-146)/(35-99) 139/63 (12/28 1800) SpO2:  [95 %-100 %] 98 % (12/28 1800) FiO2 (%):  [50 %-90 %] 50 % (12/28 1420) Weight:  [59.3 kg] 59.3 kg (12/28 0344)  General - Well nourished, well developed, intubated off sedation.  Ophthalmologic - fundi not visualized due to noncooperation. Right corneal and  conjunctiva covered with purulent secretions.   Cardiovascular - Regular rate and rhythm.  Neuro - intubated off fentanyl, eyes half way open, not following commands. Eyes in mid position, not blinking to visual threat, but does have spontaneous blinking, doll's eyes absent, not tracking, pupils 52mm, not reactive to light. Corneal reflex absent, gag and cough absent. Breathing over the vent.  Facial symmetry not able to test due to ET tube.  Tongue protrusion not cooperative. On pain stimulation, no movement in all extremities. B/l no babinski. Sensation, coordination and gait not tested.    ASSESSMENT/PLAN Ms. [name] is a 50 y.o. female with history of type 1 DM with insulin pump, ESRD on HD, DVTs, HTN, HLD, OSA, PVD presenting with HHS coma, respiratory failure. Intubated in ED, however still moving extremities. On 12/25 pt was found to have L gaze and unreactive pupils and not moving  extremities.  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 The involvement of the right cerebral hemisphere is extensive. These signal changes are nonspecific but may reflect acute infarcts, acute hypoxic/ischemic injury, seizure-related signal changes or a combination thereof. CT 12/28 - Cortical edema best seen in the right cerebral hemisphere MRI repeat pending 2D Echo EF 60-65% LTM EEG severe encephalopathy->profund diffuse encephalopathy, no seizure LDL 32, HDL 12 HgbA1c 7.8 VTE prophylaxis - heparin subq aspirin 81 mg daily prior to admission, now on aspirin 162 mg daily.  Therapy recommendations:  pending Disposition:  pending  Respiratory failure Pulmonary edema Penumonia  Intubated On fentanyl On HD CXR left lung base pneumonia On Cefepime CCM on board Vent management per primary team  DM I on insulin pump PTA HHS coma AMS -> unresponsive Glucose on admission > 600 On levemir now HgbA1c 7.8, goal < 7.0 CBGs SSI Glucose stable now Management per primary atem  Hypertension Home meds:  amlodipine Stable On home norvasc 5 Long-term BP goal normotensive  Hyperlipidemia Home meds:  pravastatin 40 LDL 32, goal < 70 Now on pravastatin 40 Continue statin at discharge  Dysphagia  On TF @ 45  Other Stroke Risk Factors PVD OSA Hx of DVTs  Other Active Problems ESRD on HD Leukocytosis WBC 14.7->16.0 Hyperglycemia K 5.3->5.2  Hospital day # 6  This patient is critically ill due to severe encephalopathy, HHS coma, respiratory failure, anoxic brain injury, ESRD, leukocytosis and at significant risk of neurological worsening, death  form anoxic brain injury, renal failure, septic shock, respiratory failure, heart failure. This patient's care requires constant monitoring of vital signs, hemodynamics, respiratory and cardiac monitoring, review of multiple databases, neurological assessment, discussion with family, other specialists and medical decision making of high complexity. I spent 40 minutes of neurocritical care time in the care of this patient. I had long discussion with Sister [name] over the phone, updated pt current condition, treatment plan and potential prognosis, and answered all the questions. She expressed understanding and appreciation.  I also discussed with Dr. Hortense Ramal and Dr. Blain Pais CCM   Rosalin Hawking, MD PhD Stroke Neurology 03/29/2021 7:01 PM   To contact Stroke Continuity provider, please refer to [email or web address] After hours, contact General Neurology

## 2021-03-29 NOTE — Progress Notes (Signed)
NAME:  [name], MRN:  [id number], DOB:  12-Oct-1970, LOS: 6 ADMISSION DATE:  03/19/2021, CONSULTATION DATE:  12/22 REFERRING MD:  Tyrone Nine, CHIEF COMPLAINT:  AMS/HHS   History of Present Illness:  [name] is a 50 year old lady with PMH significant for type 1 DM on insulin pump, ESRD on HD among multiple medical conditions listed in [place]. Patient presented to the ED with altered mental status.  It was reported that the patient had been last seen around 0930 AM on day of presentation. The patient's sister texted her several times but received no response.  Hence the sister called one of the patient's roommate and asked him to check on her.  She was found confused and incoherent. She was brought to the ED for further evaluation.   In the Ed the patient was found to be severely hyperglycemic with BS > 900. She was initially referred to the hospitalist team for admission. However her mental status worsened  so CCM team was asked to admit her. It was reported that the patient had episodes of nausea and vomiting en route to hospital. The nurses described her as incoherent but by the time PCCM was called to see her, she was unresponsive with episodes of grunting. She could not contribute to this consultation.   The patient's sister [name] who contributed to this consultation by phone reported that the patient has had a similar episode like this in the past.  [name] reported that it took her sister several day before she became coherent.  Pertinent  Medical History  T1DM on insulin pump ESRD on HD T/Th/S Previous DVT Hyperlipidemia HTN Diabetic neuropathy OSA PVD  Significant Hospital Events: Including procedures, antibiotic start and stop dates in addition to other pertinent events   12/22 admitted to ICU, patient intubated and started on insulin drip, had dialysis 12/23 off insulin gtt, still not awake failed SBT 12/24 extubated and required reinbuation due to cheynes stokes  respirations 12/25 got HD with 2.5L off. code stroke called in evening 12/27 tolerated hemodialysis well, did decompensate towards the end of the dialysis session  Interim History / Subjective:  Sedated MRI with multiple findings-signal abnormality which may be in the context of a stroke or ischemic injury Mental status still very poor, for repeat scans today  Objective   Blood pressure (!) 122/51, pulse 73, temperature 98.7 F (37.1 C), temperature source Oral, resp. rate 12, height 5\' 6"  (1.676 m), weight 59.3 kg, SpO2 100 %.    Vent Mode: PRVC FiO2 (%):  [50 %-100 %] 60 % Set Rate:  [12 bmp] 12 bmp Vt Set:  [480 mL] 480 mL PEEP:  [5 cmH20-10 cmH20] 10 cmH20 Plateau Pressure:  [14 cmH20-24 cmH20] 19 cmH20   Intake/Output Summary (Last 24 hours) at 03/29/2021 0742 Last data filed at 03/29/2021 0700 Gross per 24 hour  Intake 1254.82 ml  Output 2189 ml  Net -934.18 ml   Filed Weights   03/28/21 1338 03/28/21 1725 03/29/21 0344  Weight: 61.5 kg 60 kg 59.3 kg    Examination: Gen:   Acutely ill-appearing HEENT: Endotracheal tube in place Lungs:    Decreased air entry bilaterally CV:         S1-S2 appreciated  Abd:      Bowel sounds appreciated Ext:    Mild edema Skin:      Warm and dry; no rashes Neuro:   Off sedation, not responsive to voice or painful stimuli, does not blink to threat,  does blink with touching eyelids  Labs/imaging reviewed: CT head findings noted MRI results reviewed CBC, Chem-7 reviewed, BUN 35/creatinine 4.09  Resolved Hospital Problem list     Assessment & Plan:   Acute hypoxic respiratory failure secondary to acute pulmonary edema -Failed extubation attempt on 12/24 due to pulmonary edema, Cheyne-Stokes respirations -Remains on full vent support -Encephalopathy is a barrier to extubation at present  Acute metabolic encephalopathy secondary to HHS Code stroke called 12/25-left gaze preference noted -CT with concerns of possible occipital  infarct -MRI brain with multiple findings possibly ischemic/hypoxic versus infarcts -Neurology continues to follow, imaging ordered for today 12/28  End-stage renal disease on hemodialysis -Temporary dialysis catheter placed in left IJ 12/27 -Continue to trend electrolytes  HHS Type 1 diabetes on insulin pump -On Semglee 5 units twice daily -Continue SSI  Hypertension -Hydralazine -Amlodipine  Hyperlipidemia -Pravastatin 40 mg daily  Anemia -Anemia of chronic disease  For imaging studies today  Risk of decompensation remains high Still remains profoundly encephalopathic Prognosis is very guarded  CT head with cortical edema  Best Practice (right click and "Reselect all SmartList Selections" daily)   Diet/type: tubefeeds were held, will trickle back today DVT prophylaxis: prophylactic heparin  GI prophylaxis: PPI Lines: N/A Foley:  N/A Code Status:  full code Last date of multidisciplinary goals of care discussion [sister alicia updated 41/32 over the phone.]  Labs   CBC: Recent Labs  Lab 03/25/21 0148 03/25/21 1717 03/26/21 0140 03/26/21 2339 03/27/21 0648 03/28/21 0100 03/29/21 0357  WBC 7.7  --  6.8  --  11.5* 14.7* 16.0*  HGB 10.0*   < > 8.6* 11.6* 9.7* 8.9* 8.8*  HCT 33.3*   < > 28.4* 34.0* 33.3* 29.9* 28.3*  MCV 81.6  --  82.6  --  82.2 81.0 79.3*  PLT 144*  --  231  --  364 328 340   < > = values in this interval not displayed.    Basic Metabolic Panel: Recent Labs  Lab 03/23/21 1225 03/23/21 1902 03/23/21 2141 03/24/21 0231 03/24/21 4401 03/24/21 1653 03/24/21 2022 03/25/21 1717 03/26/21 0140 03/26/21 0932 03/26/21 2339 03/27/21 0648 03/29/21 0357  NA 135 134* 134* 135   < > 134* 134*   < > 131* 133* 126* 134* 134*  K 5.1 4.7 4.4 2.8*   < > 4.0 4.2   < > 2.6* 5.0 5.4* 5.3* 5.2*  CL 95* 93* 95* 98   < > 98 98  --  93* 96*  --  92* 96*  CO2 26 25 25 29    < > 25 25  --  27 24  --  24 27  GLUCOSE 100* 123* 99 97   < > 120* 122*  --   86 279*  --  290* 224*  BUN 52* 57* 58* 14   < > 31* 33*  --  9 28*  --  51* 35*  CREATININE 7.15* 7.24* 7.35* 2.28*   < > 4.67* 4.82*  --  1.72* 4.48*  --  6.19* 4.09*  CALCIUM 8.6* 8.7* 8.6* 7.3*   < > 8.4* 8.3*  --  6.9* 8.1*  --  8.4* 8.4*  MG 2.1 2.2 2.1 1.8  --  2.1  --   --   --   --   --  2.8* 2.4  PHOS 6.9* 7.2* 7.4* 2.5  --  5.6*  --   --   --   --   --   --   --    < > =  values in this interval not displayed.   GFR: Estimated Creatinine Clearance: 15.6 mL/min (A) (by C-G formula based on SCr of 4.09 mg/dL (H)). Recent Labs  Lab 03/24/2021 1844 03/21/2021 2044 03/23/21 0128 03/26/21 0140 03/27/21 0648 03/28/21 0100 03/29/21 0357  WBC  --   --    < > 6.8 11.5* 14.7* 16.0*  LATICACIDVEN 2.0* 2.0*  --   --   --   --   --    < > = values in this interval not displayed.    Liver Function Tests: Recent Labs  Lab 03/05/2021 1840  AST 63*  ALT 47*  ALKPHOS 383*  BILITOT 1.1  PROT 7.7  ALBUMIN 3.5   No results for input(s): LIPASE, AMYLASE in the last 168 hours. No results for input(s): AMMONIA in the last 168 hours.  ABG    Component Value Date/Time   PHART 7.245 (L) 03/26/2021 2339   PCO2ART 41.9 03/26/2021 2339   PO2ART 56 (L) 03/26/2021 2339   HCO3 18.2 (L) 03/26/2021 2339   TCO2 19 (L) 03/26/2021 2339   ACIDBASEDEF 9.0 (H) 03/26/2021 2339   O2SAT 84.0 03/26/2021 2339     Coagulation Profile: No results for input(s): INR, PROTIME in the last 168 hours.  Cardiac Enzymes: No results for input(s): CKTOTAL, CKMB, CKMBINDEX, TROPONINI in the last 168 hours.  HbA1C: Hemoglobin A1C  Date/Time Value Ref Range Status  11/02/2020 10:26 AM 8.1 (A) 4.0 - 5.6 % Final  02/17/2020 01:32 PM 9.4 (A) 4.0 - 5.6 % Final  10/22/2019 12:00 AM 8.4  Final   Hgb A1c MFr Bld  Date/Time Value Ref Range Status  03/23/2021 04:44 AM 7.8 (H) 4.8 - 5.6 % Final    Comment:    (NOTE) Pre diabetes:          5.7%-6.4%  Diabetes:              >6.4%  Glycemic control for    <7.0% adults with diabetes   07/30/2018 01:10 AM 10.4 (H) 4.8 - 5.6 % Final    Comment:    (NOTE) Pre diabetes:          5.7%-6.4% Diabetes:              >6.4% Glycemic control for   <7.0% adults with diabetes     CBG: Recent Labs  Lab 03/28/21 1908 03/28/21 2143 03/28/21 2304 03/29/21 0302 03/29/21 0714  GLUCAP 86 99 135* 188* 211*   The patient is critically ill with multiple organ systems failure and requires high complexity decision making for assessment and support, frequent evaluation and titration of therapies, application of advanced monitoring technologies and extensive interpretation of multiple databases. Critical Care Time devoted to patient care services described in this note independent of APP/resident time (if applicable)  is 35 minutes.   Sherrilyn Rist MD [facility] Personal pager: See Amion If unanswered, please page CCM On-call: [phone]

## 2021-03-30 ENCOUNTER — Inpatient Hospital Stay (HOSPITAL_COMMUNITY): Payer: Medicare HMO

## 2021-03-30 LAB — CBC
HCT: 26.3 % — ABNORMAL LOW (ref 36.0–46.0)
Hemoglobin: 8.4 g/dL — ABNORMAL LOW (ref 12.0–15.0)
MCH: 24.4 pg — ABNORMAL LOW (ref 26.0–34.0)
MCHC: 31.9 g/dL (ref 30.0–36.0)
MCV: 76.5 fL — ABNORMAL LOW (ref 80.0–100.0)
Platelets: 344 10*3/uL (ref 150–400)
RBC: 3.44 MIL/uL — ABNORMAL LOW (ref 3.87–5.11)
RDW: 17.8 % — ABNORMAL HIGH (ref 11.5–15.5)
WBC: 17.5 10*3/uL — ABNORMAL HIGH (ref 4.0–10.5)
nRBC: 0 % (ref 0.0–0.2)

## 2021-03-30 LAB — MAGNESIUM: Magnesium: 2.7 mg/dL — ABNORMAL HIGH (ref 1.7–2.4)

## 2021-03-30 LAB — GLUCOSE, CAPILLARY
Glucose-Capillary: 237 mg/dL — ABNORMAL HIGH (ref 70–99)
Glucose-Capillary: 149 mg/dL — ABNORMAL HIGH (ref 70–99)
Glucose-Capillary: 195 mg/dL — ABNORMAL HIGH (ref 70–99)
Glucose-Capillary: 115 mg/dL — ABNORMAL HIGH (ref 70–99)
Glucose-Capillary: 137 mg/dL — ABNORMAL HIGH (ref 70–99)
Glucose-Capillary: 253 mg/dL — ABNORMAL HIGH (ref 70–99)

## 2021-03-30 LAB — BASIC METABOLIC PANEL
Anion gap: 13 (ref 5–15)
BUN: 75 mg/dL — ABNORMAL HIGH (ref 6–20)
CO2: 25 mmol/L (ref 22–32)
Calcium: 8.2 mg/dL — ABNORMAL LOW (ref 8.9–10.3)
Chloride: 92 mmol/L — ABNORMAL LOW (ref 98–111)
Creatinine, Ser: 5.43 mg/dL — ABNORMAL HIGH (ref 0.44–1.00)
GFR, Estimated: 9 mL/min — ABNORMAL LOW (ref >60)
Glucose, Bld: 174 mg/dL — ABNORMAL HIGH (ref 70–99)
Potassium: 4.4 mmol/L (ref 3.5–5.1)
Sodium: 130 mmol/L — ABNORMAL LOW (ref 135–145)

## 2021-03-30 LAB — PHOSPHORUS: Phosphorus: 5.9 mg/dL — ABNORMAL HIGH (ref 2.5–4.6)

## 2021-03-30 MED ORDER — DARBEPOETIN ALFA 40 MCG/0.4ML IJ SOSY
PREFILLED_SYRINGE | INTRAMUSCULAR | Status: AC
  Administered 2021-03-30: 16:00:00 40 ug via INTRAVENOUS
  Filled 2021-03-30: qty 0.4

## 2021-03-30 MED ORDER — INSULIN ASPART 100 UNIT/ML IJ SOLN
0.0000 [IU] | INTRAMUSCULAR | Status: DC
  Administered 2021-03-30: 13:00:00 4 [IU] via SUBCUTANEOUS
  Administered 2021-03-30 (×2): 3 [IU] via SUBCUTANEOUS
  Administered 2021-03-30: 7 [IU] via SUBCUTANEOUS
  Administered 2021-03-31: 08:00:00 11 [IU] via SUBCUTANEOUS
  Administered 2021-03-31: 04:00:00 7 [IU] via SUBCUTANEOUS

## 2021-03-30 MED ORDER — HEPARIN SODIUM (PORCINE) 1000 UNIT/ML IJ SOLN
INTRAMUSCULAR | Status: AC
  Administered 2021-03-30: 16:00:00 1000 [IU] via INTRAVENOUS_CENTRAL
  Filled 2021-03-30: qty 4

## 2021-03-30 NOTE — Progress Notes (Signed)
[place], LFTs ordered

## 2021-03-30 NOTE — Progress Notes (Signed)
[facility] Progress Note    Subjective:   Pt seen in ICU, on vent   Objective:   BP (!) 96/43    Pulse 83    Temp 98.5 F (36.9 C) (Oral)    Resp (!) 23    Ht _0  (1.676 m)    Wt 59.6 kg    SpO2 94%    BMI 21.21 kg/m   Intake/Output Summary (Last 24 hours) at 03/30/2021 1259 Last data filed at 03/30/2021 1200 Gross per 24 hour  Intake 1242.5 ml  Output --  Net 1242.5 ml    Weight change: -1.9 kg  Physical Exam: Gen: not responsive, on vent CVS:s1s2, rrr Resp:intubated, cta bl EPP:IRJJ Ext: 1+ bilat UE edema, no LE edema Neuro: as above Dialysis access: LUE AVF +b/t  OP HD: [place] [facility]    4h  57.5kg   400/500  2/2 bath  LUE AVF  Hep none   Hectoral 11 mcg IV/HD Micera 225 mcg every 2 weeks (last given 03/09/21 Venofer  50 mg IV every Tuesday  Assessment/ Plan  Hyperosmolar Hyperglycemia Syndrome - s/p insulin gtt. Per primary service Fevers/ [facility] - poss bibasilar pna on 12/27 CXR. Started IV abx per CCM  Acute metabolic encephalopathy - not sure etiology, w/u in progress VDRF - extubated but had to be reintubated  ESRD - on HD TTS. HD today in ICU.  HD access - L AVF unable to cannulate, temp cath placed L IJ on 12/27. Hyperkalemia - changed TF's to [place], HD today   HTN/volume - 2 kg + today, last CXR no edema. BP's normal w/ low DBP's, have dc'd norvasc 5 mg qd.   Anemia  - stable, continue with ESA  Metabolic bone disease -  no binders for now, cont vdra    Rob Jonnie Finner, MD 03/28/2021, 11:39 AM      Labs: BMET Recent Labs  Lab 03/23/21 1902 03/23/21 2141 03/24/21 0231 03/24/21 8841 03/24/21 1653 03/24/21 2022 03/25/21 1717 03/26/21 0140 03/26/21 0932 03/26/21 2339 03/27/21 0648 03/29/21 0357 03/30/21 0737  NA 134* 134* 135   < > 134* 134* 134* 131* 133* 126* 134* 134* 130*  K 4.7 4.4 2.8*   < > 4.0 4.2 4.5 2.6* 5.0 5.4* 5.3* 5.2* 4.4  CL 93* 95* 98   < > 98 98  --  93* 96*  --  92* 96* 92*  CO2 _1 < > 25 25  --  27  24  --  _2 GLUCOSE 123* 99 97   < > 120* 122*  --  86 279*  --  290* 224* 174*  BUN 57* 58* 14   < > 31* 33*  --  9 28*  --  51* 35* 75*  CREATININE 7.24* 7.35* 2.28*   < > 4.67* 4.82*  --  1.72* 4.48*  --  6.19* 4.09* 5.43*  CALCIUM 8.7* 8.6* 7.3*   < > 8.4* 8.3*  --  6.9* 8.1*  --  8.4* 8.4* 8.2*  PHOS 7.2* 7.4* 2.5  --  5.6*  --   --   --   --   --   --   --  5.9*   < > = values in this interval not displayed.    CBC Recent Labs  Lab 03/27/21 0648 03/28/21 0100 03/29/21 0357 03/30/21 0737  WBC 11.5* 14.7* 16.0* 17.5*  HGB 9.7* 8.9* 8.8* 8.4*  HCT 33.3* 29.9* 28.3* 26.3*  MCV 82.2 81.0 79.3* 76.5*  PLT 364 328 340 344     Medications:     aspirin  162 mg Per Tube Daily   chlorhexidine gluconate (MEDLINE KIT)  15 mL Mouth Rinse BID   Chlorhexidine Gluconate Cloth  6 each Topical Q0600   collagenase   Topical Daily   Darbepoetin Alfa       darbepoetin (ARANESP) injection - DIALYSIS  40 mcg Intravenous Q Thu-HD   docusate  100 mg Per Tube BID   feeding supplement (NEPRO CARB STEADY)  1,000 mL Per Tube Q24H   feeding supplement (PROSource TF)  45 mL Per Tube Daily   fentaNYL (SUBLIMAZE) injection  50 mcg Intravenous Once   folic acid  1 mg Per Tube Daily   heparin  5,000 Units Subcutaneous Q8H   heparin sodium (porcine)       insulin aspart  0-20 Units Subcutaneous Q4H   insulin detemir  5 Units Subcutaneous BID   mouth rinse  15 mL Mouth Rinse 10 times per day   multivitamin  1 tablet Per Tube QHS   pantoprazole sodium  40 mg Per Tube Daily   polyethylene glycol  17 g Per Tube Daily   pravastatin  40 mg Per Tube q1800   sodium chloride flush  10-40 mL Intracatheter Q12H

## 2021-03-30 NOTE — Progress Notes (Signed)
STROKE TEAM PROGRESS NOTE   INTERVAL HISTORY Her sister and dialysis RN are at the bedside.  Pt still intubated not responsive, neuro unchanged. MRI brain showed similar pattern of extensive cortical restricted diffusion affecting the right more than left cerebral hemisphere. Reviewed images with sister [name], who also had meeting with CCM attending Dr. Ander Slade. She is leaning towards comfort care measures but their parents are driving from [place] and plan for family meeting tomorrow for further [place] discussion.   Vitals:   03/30/21 1600 03/30/21 1615 03/30/21 1634 03/30/21 1700  BP: 108/65  109/86   Pulse: 91   96  Resp: 17   16  Temp:  98.2 F (36.8 C)    TempSrc:  Axillary    SpO2: 96%   95%  Weight:   58 kg   Height:       CBC:  Recent Labs  Lab 03/29/21 0357 03/30/21 0737  WBC 16.0* 17.5*  HGB 8.8* 8.4*  HCT 28.3* 26.3*  MCV 79.3* 76.5*  PLT 340 557   Basic Metabolic Panel:  Recent Labs  Lab 03/24/21 1653 03/24/21 2022 03/29/21 0357 03/30/21 0737  NA 134*   < > 134* 130*  K 4.0   < > 5.2* 4.4  CL 98   < > 96* 92*  CO2 25   < > 27 25  GLUCOSE 120*   < > 224* 174*  BUN 31*   < > 35* 75*  CREATININE 4.67*   < > 4.09* 5.43*  CALCIUM 8.4*   < > 8.4* 8.2*  MG 2.1   < > 2.4 2.7*  PHOS 5.6*  --   --  5.9*   < > = values in this interval not displayed.   HgbA1c:  No results for input(s): HGBA1C in the last 168 hours.  Urine Drug Screen: No results for input(s): LABOPIA, COCAINSCRNUR, LABBENZ, AMPHETMU, THCU, LABBARB in the last 168 hours.  Alcohol Level  No results for input(s): ETH in the last 168 hours.   IMAGING past 24 hours MR BRAIN WO CONTRAST  Result Date: 03/30/2021 CLINICAL DATA:  Mental status change with unknown cause EXAM: MRI HEAD WITHOUT CONTRAST TECHNIQUE: Multiplanar, multiecho pulse sequences of the brain and surrounding structures were obtained without intravenous contrast. COMPARISON:  Brain MRI from 3 days ago FINDINGS: Brain: Restricted  diffusion involving the cortex of the right more than left cerebral hemisphere, most heavily affecting the posterior circulation distributions but certainly seen in the anterior circulation, especially on the right. This degree of diffusion restriction is brighter (which is likely from timing or scanner differences), but not progressed in extent. Both hippocampi are involved. Notable sparing of the perirolandic level, of undetermined significance in this setting. In the absence of cerebritis symptoms features - anoxia, hypoglycemia, or seizure complicated by infarct are leading considerations-although the asymmetry is atypical for these entities. No chart indication of posterior reversible encephalopathy risk factors. There is also limited midline upper cerebellar involvement which is atypical. No hemorrhage, hydrocephalus, or notable swelling. Mild chronic white matter disease. Vascular: Major flow voids are preserved Skull and upper cervical spine: Normal marrow signal Sinuses/Orbits: Partial bilateral mastoid opacification. Adenoid thickening. Case discussed with Dr. Erlinda Hong. IMPRESSION: Unchanged extent of cortical restricted diffusion affecting the right more than left cerebral hemisphere, as discussed above. No evidence of new or reversible insult. Electronically Signed   By: Jorje Guild M.D.   On: 03/30/2021 11:05    PHYSICAL EXAM  Temp:  [98.2 F (36.8 C)-99.7  F (37.6 C)] 98.2 F (36.8 C) (12/29 1615) Pulse Rate:  [77-96] 96 (12/29 1700) Resp:  [13-26] 16 (12/29 1700) BP: (89-172)/(43-86) 109/86 (12/29 1634) SpO2:  [91 %-98 %] 95 % (12/29 1700) FiO2 (%):  [40 %-50 %] 40 % (12/29 1520) Weight:  [58 kg-59.6 kg] 58 kg (12/29 1634)  General - Well nourished, well developed, intubated off sedation.  Ophthalmologic - fundi not visualized due to noncooperation. Right corneal and conjunctiva covered with purulent secretions.   Cardiovascular - Regular rate and rhythm.  Neuro - intubated off  fentanyl, eyes half way open, not following commands. Eyes in mid position, not blinking to visual threat, but does have spontaneous blinking, doll's eyes absent, not tracking, pupils 65mm, not reactive to light. Corneal reflex absent, gag and cough absent. Breathing over the vent.  Facial symmetry not able to test due to ET tube.  Tongue protrusion not cooperative. On pain stimulation, no movement in all extremities. B/l no babinski. Sensation, coordination and gait not tested.   ASSESSMENT/PLAN Ms. EMELYNN RANCE is a 50 y.o. female with history of type 1 DM with insulin pump, ESRD on HD, DVTs, HTN, HLD, OSA, PVD presenting with HHS coma, respiratory failure. Intubated in ED, however still moving extremities. On 12/25 pt was found to have L gaze and unreactive pupils and not moving extremities.  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   CT head No acute abnormality but questionable right occipital hypodensity CTA head & neck unremarkable MRI brain Diffusion-weighted and T2 FLAIR hyperintense signal abnormality within the right cerebral hemisphere, left parietal and occipital lobes and superomedial left cerebellar hemisphere. The involvement of the right cerebral hemisphere is extensive. These signal changes are nonspecific but may reflect acute infarcts, acute hypoxic/ischemic injury, seizure-related signal changes or a combination thereof. CT 12/28 - Cortical edema best seen in the right cerebral hemisphere MRI repeat Unchanged extent of cortical restricted diffusion affecting the right more than left cerebral hemisphere 2D Echo EF 60-65% LTM EEG severe encephalopathy->profund diffuse encephalopathy, no seizure LDL 32, HDL 12 HgbA1c 7.8 VTE prophylaxis - heparin subq aspirin 81 mg daily prior to admission, now  on aspirin 162 mg daily.  Therapy recommendations:  pending Disposition:  pending, very poor prognosis, family meeting tomorrow for [name] discussion  Respiratory failure Pulmonary edema [place]  Intubated On fentanyl On HD CXR left lung base pneumonia On Cefepime CCM on board Vent management per primary team  DM I on insulin pump PTA HHS coma AMS -> unresponsive Glucose on admission > 600 On levemir now HgbA1c 7.8, goal < 7.0 CBGs SSI Glucose stable now Management per primary atem  Hypertension Episodes of hypotension Home meds:  amlodipine Stable On home norvasc 5 Long-term BP goal normotensive  Hyperlipidemia Home meds:  pravastatin 40 LDL 32, goal < 70 Now on pravastatin 40 Continue statin at discharge  Dysphagia  On TF @ 46  Other Stroke Risk Factors PVD OSA Hx of DVTs  Other Active Problems ESRD on HD Leukocytosis WBC 14.7->16.0->17.5 Hyperglycemia K 5.3->5.2->4.4  Hospital day # 7  This patient is critically ill due to severe encephalopathy, HHS coma, respiratory failure, anoxic brain injury, ESRD, leukocytosis and at significant risk of neurological worsening, death form anoxic brain injury, renal failure, septic shock, respiratory failure, heart failure. This patient's care requires constant monitoring of vital signs, hemodynamics, respiratory and cardiac monitoring, review  of multiple databases, neurological assessment, discussion with family, other specialists and medical decision making of high complexity. I spent 35 minutes of neurocritical care time in the care of this patient. I had long discussion with Sister [name] at the bedside, updated pt current condition, treatment plan and potential prognosis, and answered all the questions. She expressed understanding and appreciation.    Rosalin Hawking, MD PhD Stroke Neurology 03/30/2021 5:55 PM   To contact Stroke Continuity provider, please refer to [email or web address] After hours, contact General  Neurology

## 2021-03-30 NOTE — Progress Notes (Signed)
NAME:  [name], MRN:  [id number], DOB:  [birth year], LOS: 7 ADMISSION DATE:  03/03/2021, CONSULTATION DATE:  12/22 REFERRING MD:  Tyrone Nine, CHIEF COMPLAINT:  AMS/HHS   History of Present Illness:  [name] is a 50 year old lady with PMH significant for type 1 DM on insulin pump, ESRD on HD among multiple medical conditions listed in [place]. Patient presented to the ED with altered mental status.  It was reported that the patient had been last seen around 0930 AM on day of presentation. The patient's sister texted her several times but received no response.  Hence the sister called one of the patient's roommate and asked him to check on her.  She was found confused and incoherent. She was brought to the ED for further evaluation.   In the Ed the patient was found to be severely hyperglycemic with BS > 900. She was initially referred to the hospitalist team for admission. However her mental status worsened  so CCM team was asked to admit her. It was reported that the patient had episodes of nausea and vomiting en route to hospital. The nurses described her as incoherent but by the time PCCM was called to see her, she was unresponsive with episodes of grunting. She could not contribute to this consultation.   The patient's sister [name] who contributed to this consultation by phone reported that the patient has had a similar episode like this in the past.  [name] reported that it took her sister several day before she became coherent.  Pertinent  Medical History  T1DM on insulin pump ESRD on HD T/Th/S Previous DVT Hyperlipidemia HTN Diabetic neuropathy OSA PVD  Significant Hospital Events: Including procedures, antibiotic start and stop dates in addition to other pertinent events   12/22 admitted to ICU, patient intubated and started on insulin drip, had dialysis 12/23 off insulin gtt, still not awake failed SBT 12/24 extubated and required reinbuation due to cheynes stokes  respirations 12/25 got HD with 2.5L off. code stroke called in evening 12/27 tolerated hemodialysis well, did decompensate towards the end of the dialysis session 12/29 off sedation, doing poorly neurologically  Interim History / Subjective:  Off sedation Minimal response to painful stimuli, more responsive on the right side MRI with multiple findings-signal abnormality which may be in the context of a stroke or ischemic injury.  CT with brain edema MRI pending for today  Objective   Blood pressure (!) 134/48, pulse 79, temperature 99.1 F (37.3 C), temperature source Oral, resp. rate 15, height 5\' 6"  (1.676 m), weight 59.6 kg, SpO2 96 %.    Vent Mode: PRVC FiO2 (%):  [40 %-50 %] 40 % Set Rate:  [12 bmp] 12 bmp Vt Set:  [480 mL] 480 mL PEEP:  [5 cmH20-10 cmH20] 5 cmH20 Plateau Pressure:  [11 cmH20-24 cmH20] 11 cmH20   Intake/Output Summary (Last 24 hours) at 03/30/2021 0747 Last data filed at 03/30/2021 0700 Gross per 24 hour  Intake 1217.5 ml  Output --  Net 1217.5 ml   Filed Weights   03/28/21 1725 03/29/21 0344 03/30/21 0500  Weight: 60 kg 59.3 kg 59.6 kg    Examination: Gen:   Acutely ill-appearing HEENT: Endotracheal tube in place Lungs:    Decreased air movement bilaterally CV:         S1-S2 appreciated  Abd:      Bowel sounds appreciated Ext:    Mild extremity edema Skin:      Warm and dry; no  rashes Neuro:   Off sedation, responds to painful stimuli right upper extremity, less so left upper extremity  Labs/imaging reviewed: CT head findings noted  MRI results reviewed 12/29 labs pending  Resolved Hospital Problem list     Assessment & Plan:   Acute hypoxemic respiratory failure secondary to acute pulmonary edema -Failed extubation attempt on 12/24 due to pulmonary edema, Cheyne-Stokes respirations -Remains on full ventilator support -Encephalopathy is a barrier to extubation at present  Acute metabolic encephalopathy secondary to HHS Code stroke  called 12/25-left gaze preference noted -CT with concerns of occipital infarct -Repeat CT with evidence of brain edema -MRI with multiple findings possibly ischemic/hypoxic versus infarcts -Repeat MRI pending -Neurology continues to follow -Neurology did update sister 12/28 -Prognosis neurologically appears poor  End-stage renal disease on hemodialysis -Temporary dialysis catheter placed in left IJ 12/27 -We will continue to trend electrolytes -Dialysis by nephrology  HHS Type 1 diabetes on insulin pump -Continue SSI -Levemir  Hypertension -Home antihypertensives on hold-was on hydralazine, amlodipine  Hyperlipidemia -Pravastatin 40 mg daily  Probable pneumonia -On cefepime  She is for MRI today Not doing well neurologically Risk of decompensation remains high Still remains profoundly encephalopathic Prognosis is very guarded   Best Practice (right click and "Reselect all SmartList Selections" daily)   Diet/type: tubefeeds  DVT prophylaxis: prophylactic heparin  GI prophylaxis: PPI Lines: N/A Foley:  N/A Code Status:  full code Last date of multidisciplinary goals of care discussion [sister alicia updated 69/48 over the phone.]  Labs   CBC: Recent Labs  Lab 03/25/21 0148 03/25/21 1717 03/26/21 0140 03/26/21 2339 03/27/21 0648 03/28/21 0100 03/29/21 0357  WBC 7.7  --  6.8  --  11.5* 14.7* 16.0*  HGB 10.0*   < > 8.6* 11.6* 9.7* 8.9* 8.8*  HCT 33.3*   < > 28.4* 34.0* 33.3* 29.9* 28.3*  MCV 81.6  --  82.6  --  82.2 81.0 79.3*  PLT 144*  --  231  --  364 328 340   < > = values in this interval not displayed.    Basic Metabolic Panel: Recent Labs  Lab 03/23/21 1225 03/23/21 1902 03/23/21 2141 03/24/21 0231 03/24/21 5462 03/24/21 1653 03/24/21 2022 03/25/21 1717 03/26/21 0140 03/26/21 0932 03/26/21 2339 03/27/21 0648 03/29/21 0357  NA 135 134* 134* 135   < > 134* 134*   < > 131* 133* 126* 134* 134*  K 5.1 4.7 4.4 2.8*   < > 4.0 4.2   < > 2.6*  5.0 5.4* 5.3* 5.2*  CL 95* 93* 95* 98   < > 98 98  --  93* 96*  --  92* 96*  CO2 26 25 25 29    < > 25 25  --  27 24  --  24 27  GLUCOSE 100* 123* 99 97   < > 120* 122*  --  86 279*  --  290* 224*  BUN 52* 57* 58* 14   < > 31* 33*  --  9 28*  --  51* 35*  CREATININE 7.15* 7.24* 7.35* 2.28*   < > 4.67* 4.82*  --  1.72* 4.48*  --  6.19* 4.09*  CALCIUM 8.6* 8.7* 8.6* 7.3*   < > 8.4* 8.3*  --  6.9* 8.1*  --  8.4* 8.4*  MG 2.1 2.2 2.1 1.8  --  2.1  --   --   --   --   --  2.8* 2.4  PHOS 6.9* 7.2* 7.4* 2.5  --  5.6*  --   --   --   --   --   --   --    < > = values in this interval not displayed.   GFR: Estimated Creatinine Clearance: 15.6 mL/min (A) (by C-G formula based on SCr of 4.09 mg/dL (H)). Recent Labs  Lab 03/26/21 0140 03/27/21 0648 03/28/21 0100 03/29/21 0357  WBC 6.8 11.5* 14.7* 16.0*    Liver Function Tests: No results for input(s): AST, ALT, ALKPHOS, BILITOT, PROT, ALBUMIN in the last 168 hours.  No results for input(s): LIPASE, AMYLASE in the last 168 hours. No results for input(s): AMMONIA in the last 168 hours.  ABG    Component Value Date/Time   PHART 7.245 (L) 03/26/2021 2339   PCO2ART 41.9 03/26/2021 2339   PO2ART 56 (L) 03/26/2021 2339   HCO3 18.2 (L) 03/26/2021 2339   TCO2 19 (L) 03/26/2021 2339   ACIDBASEDEF 9.0 (H) 03/26/2021 2339   O2SAT 84.0 03/26/2021 2339     Coagulation Profile: No results for input(s): INR, PROTIME in the last 168 hours.  Cardiac Enzymes: No results for input(s): CKTOTAL, CKMB, CKMBINDEX, TROPONINI in the last 168 hours.  HbA1C: Hemoglobin A1C  Date/Time Value Ref Range Status  11/02/2020 10:26 AM 8.1 (A) 4.0 - 5.6 % Final  02/17/2020 01:32 PM 9.4 (A) 4.0 - 5.6 % Final  10/22/2019 12:00 AM 8.4  Final   Hgb A1c MFr Bld  Date/Time Value Ref Range Status  03/23/2021 04:44 AM 7.8 (H) 4.8 - 5.6 % Final    Comment:    (NOTE) Pre diabetes:          5.7%-6.4%  Diabetes:              >6.4%  Glycemic control for    <7.0% adults with diabetes   07/30/2018 01:10 AM 10.4 (H) 4.8 - 5.6 % Final    Comment:    (NOTE) Pre diabetes:          5.7%-6.4% Diabetes:              >6.4% Glycemic control for   <7.0% adults with diabetes     CBG: Recent Labs  Lab 03/29/21 1546 03/29/21 1919 03/29/21 2310 03/30/21 0307 03/30/21 0708  GLUCAP 212* 214* 181* 237* 149*   The patient is critically ill with multiple organ systems failure and requires high complexity decision making for assessment and support, frequent evaluation and titration of therapies, application of advanced monitoring technologies and extensive interpretation of multiple databases. Critical Care Time devoted to patient care services described in this note independent of APP/resident time (if applicable)  is 35 minutes.   Sherrilyn Rist MD [facility] Personal pager: See Amion If unanswered, please page CCM On-call: [phone]

## 2021-03-30 NOTE — Progress Notes (Signed)
Patient transported to MRI and returned to 2M02. No complications. Vitals stable throughout. RT will continue to monitor.

## 2021-03-30 NOTE — Progress Notes (Signed)
Goals of care discussion with patient's sister Wynetta Fines, Patient's other family members were contacted by phone-driving on their way in for family meeting tomorrow  Questions regarding ongoing care and prognosis discussed  Prognosis at present is very poor

## 2021-03-31 DIAGNOSIS — J9600 Acute respiratory failure, unspecified whether with hypoxia or hypercapnia: Secondary | ICD-10-CM

## 2021-03-31 DIAGNOSIS — I6782 Cerebral ischemia: Secondary | ICD-10-CM

## 2021-03-31 DIAGNOSIS — Z7189 Other specified counseling: Secondary | ICD-10-CM

## 2021-03-31 DIAGNOSIS — Z515 Encounter for palliative care: Secondary | ICD-10-CM

## 2021-03-31 LAB — GLUCOSE, CAPILLARY
Glucose-Capillary: 237 mg/dL — ABNORMAL HIGH (ref 70–99)
Glucose-Capillary: 252 mg/dL — ABNORMAL HIGH (ref 70–99)
Glucose-Capillary: 123 mg/dL — ABNORMAL HIGH (ref 70–99)
Glucose-Capillary: 267 mg/dL — ABNORMAL HIGH (ref 70–99)

## 2021-03-31 LAB — HEPATIC FUNCTION PANEL
ALT: 104 U/L — ABNORMAL HIGH (ref 0–44)
AST: 156 U/L — ABNORMAL HIGH (ref 15–41)
Albumin: 1.6 g/dL — ABNORMAL LOW (ref 3.5–5.0)
Alkaline Phosphatase: 964 U/L — ABNORMAL HIGH (ref 38–126)
Bilirubin, Direct: 0.1 mg/dL (ref 0.0–0.2)
Indirect Bilirubin: 0.5 mg/dL (ref 0.3–0.9)
Total Bilirubin: 0.6 mg/dL (ref 0.3–1.2)
Total Protein: 6.4 g/dL — ABNORMAL LOW (ref 6.5–8.1)

## 2021-03-31 LAB — PROTIME-INR
INR: 1.1 (ref 0.8–1.2)
Prothrombin Time: 13.8 seconds (ref 11.4–15.2)

## 2021-03-31 LAB — APTT: aPTT: 40 seconds — ABNORMAL HIGH (ref 24–36)

## 2021-03-31 MED ORDER — HALOPERIDOL 1 MG PO TABS
2.0000 mg | ORAL_TABLET | ORAL | Status: DC | PRN
  Filled 2021-03-31: qty 2

## 2021-03-31 MED ORDER — LORAZEPAM 2 MG/ML PO CONC: 1.0000 mg | ORAL | Status: DC | PRN

## 2021-03-31 MED ORDER — LORAZEPAM 2 MG/ML IJ SOLN
1.0000 mg | INTRAMUSCULAR | Status: DC | PRN
  Administered 2021-03-31: 20:00:00 1 mg via INTRAVENOUS
  Filled 2021-03-31: qty 1

## 2021-03-31 MED ORDER — BIOTENE DRY MOUTH MT LIQD: 15.0000 mL | OROMUCOSAL | Status: DC | PRN

## 2021-03-31 MED ORDER — HALOPERIDOL LACTATE 2 MG/ML PO CONC
2.0000 mg | ORAL | Status: DC | PRN
  Filled 2021-03-31: qty 1

## 2021-03-31 MED ORDER — GLYCOPYRROLATE 0.2 MG/ML IJ SOLN: 0.2000 mg | INTRAMUSCULAR | Status: DC | PRN

## 2021-03-31 MED ORDER — GLYCOPYRROLATE 0.2 MG/ML IJ SOLN
0.2000 mg | INTRAMUSCULAR | Status: DC | PRN
  Administered 2021-03-31: 20:00:00 0.2 mg via INTRAVENOUS
  Filled 2021-03-31: qty 1

## 2021-03-31 MED ORDER — HYDROMORPHONE BOLUS VIA INFUSION
1.0000 mg | INTRAVENOUS | Status: DC | PRN
  Administered 2021-03-31 (×3): 1 mg via INTRAVENOUS
  Filled 2021-03-31: qty 1

## 2021-03-31 MED ORDER — GLYCOPYRROLATE 1 MG PO TABS: 1.0000 mg | ORAL_TABLET | ORAL | Status: DC | PRN

## 2021-03-31 MED ORDER — HYDROMORPHONE HCL PF 10 MG/ML IJ SOLN
0.5000 mg/h | INTRAMUSCULAR | Status: DC
Start: 2021-03-31 — End: 2021-04-01
  Administered 2021-03-31: 20:00:00 2 mg/h via INTRAVENOUS
  Filled 2021-03-31: qty 5

## 2021-03-31 MED ORDER — LORAZEPAM 1 MG PO TABS: 1.0000 mg | ORAL_TABLET | ORAL | Status: DC | PRN

## 2021-03-31 MED ORDER — HALOPERIDOL LACTATE 5 MG/ML IJ SOLN
2.0000 mg | INTRAMUSCULAR | Status: DC | PRN
  Administered 2021-03-31: 21:00:00 2 mg via INTRAVENOUS
  Filled 2021-03-31: qty 1

## 2021-04-02 NOTE — Progress Notes (Signed)
Wasted 129mL of Dilaudid IVPB in 55M med room stericycle with Colleen Can, RN

## 2021-04-02 NOTE — Progress Notes (Signed)
[facility] Progress Note    Subjective:   Pt seen in ICU, on vent. HD yest w/ 2 L UF.    Objective:   BP (!) 141/53    Pulse 93    Temp 98.1 F (36.7 C) (Oral)    Resp 17    Ht 5' 6" (1.676 m)    Wt 56.7 kg    SpO2 100%    BMI 20.18 kg/m   Intake/Output Summary (Last 24 hours) at April 22, 2021 1116 Last data filed at 22-Apr-2021 0700 Gross per 24 hour  Intake 1203 ml  Output 2000 ml  Net -797 ml    Weight change: 0 kg  Physical Exam: Gen: not responsive, on vent CVS:s1s2, rrr Resp:intubated, cta bl QGB:EEFE Ext: 1+ bilat UE edema and trace LE edema Neuro: as above Dialysis access: LUE AVF +b/t  OP HD: [place] [facility]    4h  57.5kg   400/500  2/2 bath  LUE AVF  Hep none   Hectoral 11 mcg IV/HD Micera 225 mcg every 2 weeks (last given 03/09/21 Venofer  50 mg IV every Tuesday  Assessment/ Plan  Hyperosmolar Hyperglycemia Syndrome - s/p insulin gtt. Per primary service Fevers/ [facility] - poss bibasilar pna on 12/27 CXR. IV cefepime per CCM  Acute metabolic encephalopathy - not awakening, on ventilator VDRF - per CCM  ESRD - on HD TTS. HD Sat in ICU HD access - L AVF unable to cannulate, temp cath placed L IJ on 12/27. Hyperkalemia - resolved, on Nepro now  HTN/volume - 1kg under, last CXR no edema. BP's normal w/ low DBP's, have dc'd norvasc 5 mg qd. Has diffuse mild edema.    Anemia  - stable, continue with ESA  Metabolic bone disease -  no binders for now, cont vdra    Kelly Splinter, MD 03/28/2021, 11:39 AM      Labs: BMET Recent Labs  Lab 03/24/21 1653 03/24/21 2022 03/25/21 1717 03/26/21 0140 03/26/21 0932 03/26/21 2339 03/27/21 0648 03/29/21 0357 03/30/21 0737  NA 134* 134* 134* 131* 133* 126* 134* 134* 130*  K 4.0 4.2 4.5 2.6* 5.0 5.4* 5.3* 5.2* 4.4  CL 98 98  --  93* 96*  --  92* 96* 92*  CO2 25 25  --  27 24  --  _0 GLUCOSE 120* 122*  --  86 279*  --  290* 224* 174*  BUN 31* 33*  --  9 28*  --  51* 35* 75*  CREATININE 4.67* 4.82*   --  1.72* 4.48*  --  6.19* 4.09* 5.43*  CALCIUM 8.4* 8.3*  --  6.9* 8.1*  --  8.4* 8.4* 8.2*  PHOS 5.6*  --   --   --   --   --   --   --  5.9*    CBC Recent Labs  Lab 03/27/21 0648 03/28/21 0100 03/29/21 0357 03/30/21 0737  WBC 11.5* 14.7* 16.0* 17.5*  HGB 9.7* 8.9* 8.8* 8.4*  HCT 33.3* 29.9* 28.3* 26.3*  MCV 82.2 81.0 79.3* 76.5*  PLT 364 328 340 344     Medications:     aspirin  162 mg Per Tube Daily   chlorhexidine gluconate (MEDLINE KIT)  15 mL Mouth Rinse BID   Chlorhexidine Gluconate Cloth  6 each Topical Q0600   collagenase   Topical Daily   darbepoetin (ARANESP) injection - DIALYSIS  40 mcg Intravenous Q Thu-HD   docusate  100 mg Per Tube BID  supplement (NEPRO CARB STEADY)  1,000 mL Per Tube Q24H  ° feeding supplement (PROSource TF)  45 mL Per Tube Daily  ° fentaNYL (SUBLIMAZE) injection  50 mcg Intravenous Once  ° folic acid  1 mg Per Tube Daily  ° heparin  5,000 Units Subcutaneous Q8H  ° insulin aspart  0-20 Units Subcutaneous Q4H  ° insulin detemir  5 Units Subcutaneous BID  ° mouth rinse  15 mL Mouth Rinse 10 times per day  ° multivitamin  1 tablet Per Tube QHS  ° pantoprazole sodium  40 mg Per Tube Daily  ° polyethylene glycol  17 g Per Tube Daily  ° pravastatin  40 mg Per Tube q1800  ° sodium chloride flush  10-40 mL Intracatheter Q12H  ° ° ° ° ° °

## 2021-04-02 NOTE — Progress Notes (Signed)
STROKE TEAM PROGRESS NOTE   INTERVAL HISTORY No family at the bedside. Floor RN told me, family members were in the conference room with palliative care for [name] discussion and have decided comfort care measures. I also check pt again and she still intubated and unresponsive but still has spontaneous blinking but not blinking to visual threat. No movement of extremities.   Vitals:   2021/04/18 1400 2021/04/18 1500 2021/04/18 1600 04/18/21 1700  BP: (!) 138/52 (!) 128/50 (!) 135/52 (!) 130/44  Pulse: 100 (!) 103 (!) 106 (!) 107  Resp: 20 (!) 22 (!) 22 (!) 24  Temp:      TempSrc:      SpO2: 92% 92% 91% 91%  Weight:      Height:       CBC:  Recent Labs  Lab 03/29/21 0357 03/30/21 0737  WBC 16.0* 17.5*  HGB 8.8* 8.4*  HCT 28.3* 26.3*  MCV 79.3* 76.5*  PLT 340 626   Basic Metabolic Panel:  Recent Labs  Lab 03/29/21 0357 03/30/21 0737  NA 134* 130*  K 5.2* 4.4  CL 96* 92*  CO2 27 25  GLUCOSE 224* 174*  BUN 35* 75*  CREATININE 4.09* 5.43*  CALCIUM 8.4* 8.2*  MG 2.4 2.7*  PHOS  --  5.9*   HgbA1c:  No results for input(s): HGBA1C in the last 168 hours.  Urine Drug Screen: No results for input(s): LABOPIA, COCAINSCRNUR, LABBENZ, AMPHETMU, THCU, LABBARB in the last 168 hours.  Alcohol Level  No results for input(s): ETH in the last 168 hours.   IMAGING past 24 hours No results found.  PHYSICAL EXAM  Temp:  [98 F (36.7 C)-101.1 F (38.4 C)] 99 F (37.2 C) (12/30 1100) Pulse Rate:  [92-107] 107 (12/30 1700) Resp:  [15-24] 24 (12/30 1700) BP: (96-175)/(35-63) 130/44 (12/30 1700) SpO2:  [88 %-100 %] 91 % (12/30 1700) FiO2 (%):  [40 %-50 %] 50 % (12/30 1206) Weight:  [56.7 kg] 56.7 kg (12/30 0500)  General - Well nourished, well developed, intubated off sedation.  Ophthalmologic - fundi not visualized due to noncooperation. Right corneal and conjunctiva covered with purulent secretions.   Cardiovascular - Regular rate and rhythm.  Neuro - intubated off fentanyl,  eyes half way open, not following commands. Eyes in mid position, not blinking to visual threat, but does have spontaneous blinking, doll's eyes absent, not tracking, pupils 22mm, not reactive to light. Corneal reflex absent, gag and cough absent. Breathing over the vent.  Facial symmetry not able to test due to ET tube.  Tongue protrusion not cooperative. On pain stimulation, no movement in all extremities. B/l no babinski. Sensation, coordination and gait not tested.   ASSESSMENT/PLAN [name] is a 51 y.o. female with history of type 1 DM with insulin pump, ESRD on HD, DVTs, HTN, HLD, OSA, PVD presenting with HHS coma, respiratory failure. Intubated in ED, however still moving extremities. On 12/25 pt was found to have L gaze and unreactive pupils and not moving extremities.  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   CT head No acute abnormality but questionable right occipital hypodensity CTA head & neck unremarkable MRI brain Diffusion-weighted and T2 FLAIR hyperintense signal abnormality within the right cerebral hemisphere, left parietal and occipital lobes and superomedial left cerebellar hemisphere. The involvement of the right cerebral hemisphere is extensive. These signal changes are  nonspecific but may reflect acute infarcts, acute hypoxic/ischemic injury, seizure-related signal changes or a combination thereof. CT 12/28 - Cortical edema best seen in the right cerebral hemisphere MRI repeat Unchanged extent of cortical restricted diffusion affecting the right more than left cerebral hemisphere 2D Echo EF 60-65% LTM EEG severe encephalopathy->profund diffuse encephalopathy, no seizure LDL 32, HDL 12 HgbA1c 7.8 VTE prophylaxis -was on heparin subq aspirin 81 mg daily prior to admission, was on  aspirin 162 mg daily.  Disposition:  very poor prognosis, family has requested comfort care measures with palliative care  Respiratory failure Pulmonary edema [place]  Intubated Off fentanyl CXR left lung base pneumonia Was on Cefepime CCM on board Vent management per primary team  DM I on insulin pump PTA HHS coma AMS -> unresponsive Glucose on admission > 600 HgbA1c 7.8, goal < 7.0  Hypertension Episodes of hypotension Home meds:  amlodipine Stable Was on on home norvasc 5  Hyperlipidemia Home meds:  pravastatin 40 LDL 32, goal < 70 was resumed on pravastatin 40  Dysphagia  Was on TF @ 1, now off for comfort care measures  Other Stroke Risk Factors PVD OSA Hx of DVTs  Other Active Problems ESRD on HD Leukocytosis WBC 14.7->16.0->17.5 Hyperglycemia K 5.3->5.2->4.4  Hospital day # 8  Neurology will sign off. Please call with questions. Thanks for the consult.   Rosalin Hawking, MD PhD Stroke Neurology 13-Apr-2021 5:42 PM   To contact Stroke Continuity provider, please refer to [email or web address] After hours, contact General Neurology

## 2021-04-02 NOTE — Consult Note (Signed)
Consultation Note Date: 2021-04-25   Patient Name: [name]  DOB: [birth year]  MRN: [id number]  Age / Sex: 51 y.o., female  PCP: Loyola Mast, PA-C Referring Physician: Spero Geralds, MD  Reason for Consultation: Establishing goals of care  HPI/Patient Profile: 51 y.o. female  with past medical history of  type 1 DM on insulin pump, ESRD on HD, DVT, hld, htn, neuropathy, OSA, and PVD admitted on 03/28/2021 with AMS - required intubation. 12/24 was extubated but required reintubation. Code stroke called evening of 12/25. As of 12/30 patient with no response to painful stimuli. MRI with multiple findings-signal abnormality which may be in the context of a stroke or ischemic injury.  CT with brain edema. Neurology reports very poor prognosis. PMT consulted for [place] discussions with family.   Clinical Assessment and Goals of Care: I have reviewed medical records including EPIC notes, labs and imaging, received report from Dr. Ander Slade and RN, assessed the patient and then met with patient's father, step-mother, and siblings to discuss diagnosis prognosis, GOC, EOL wishes, disposition and options.  I introduced Palliative Medicine as specialized medical care for people living with serious illness. It focuses on providing relief from the symptoms and stress of a serious illness. The goal is to improve quality of life for both the patient and the family.  We discussed a brief life review of the patient. Family tells me patient lived independently with roommates. They tell me about how she manager her insulin pump independently. They tell me about the loss of her teenage son. 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   As far as functional and nutritional status, they tell me patient was wheelchair bound but was able to care for herself independently.    We discussed patient's current illness and what  it means in the larger context of patient's on-going co-morbidities.  Natural disease trajectory and expectations at EOL were discussed. We discussed patient's admission with HHS coma, respiratory failure, pulmonary edema, pneumonia, ESRD, and brain injury. We discussed poor prognosis.   I attempted to elicit values and goals of care important to the patient.  Family is quick to share that patient would never want to live dependent on others or in a state where she is not able to meaningfully interact with others.   The difference between aggressive medical intervention and comfort care was considered in light of the patient's goals of care. We discussed what a continued aggressive medical path would entail. We also discussed what a comfort path would entail. After each was thoroughly explained and all questions address family elected comfort path. We discussed dc meds not contributing to comfort and initiating meds that will assist with comfort. Discussed freeing patient from ventilator and allowing a natural death.   Discussed changing code status to DNR and family agrees.   Questions and concerns were addressed. The family was encouraged to call with questions or concerns.   Primary Decision Maker NEXT OF KIN - family making decisions jointly - father and siblings   SUMMARY OF RECOMMENDATIONS   - comfort orders placed, family taking time to facetime additional family members once that is done they will notify nurse they are ready to proceed with extubation  Code Status/Advance Care Planning: DNR   Symptom Management:  Dilaudid infusion PRN ativan, robinul  Additional Recommendations (Limitations, Scope, Preferences): Full Comfort Care  Prognosis:  Hours - Days  Discharge Planning: Anticipated Hospital Death  Primary Diagnoses: Present on Admission:  Hyperosmolar hyperglycemic state (HHS) ([place])  Type 1 diabetes mellitus with hyperosmolar hyperglycemic state (HHS)  ([place])   I have reviewed the medical record, interviewed the patient and family, and examined the patient. The following aspects are pertinent.  Past Medical History:  Diagnosis Date   Anemia    Arthritis    Bronchitis    C. difficile diarrhea 09/26/2014   Cataracts, both eyes    had surgery to remove   Cellulitis of right foot 06/02/2014   CKD (chronic kidney disease) stage 3, GFR 30-59 ml/min (HCC)    Dialysis T, TH, Sat   Diabetic ulcer of right foot ([place]) 06/02/2014   DVT (deep venous thrombosis) ([place]) 10/2014   one in each one in leg- PICC line , one in right and left arm   H/O seasonal allergies    History of blood transfusion    Hypercholesteremia    Hypertension    Left foot infection    Neuromuscular disorder (HCC)    lower legs and feet   Neuropathy in diabetes [facility])    Peripheral vascular disease (HCC)    Sleep apnea    does not use cpap   Staphylococcus aureus bacteremia 10/2014   Type 1 diabetes ([place])    onset age 29   Social History   Socioeconomic History   Marital status: Single    Spouse name: Not on file   Number of children: Not on file   Years of education: Not on file   Highest education level: Not on file  Occupational History   Occupation: disabled  Tobacco Use   Smoking status: Former    Years: 15.00    Types: Cigarettes    Quit date: 05/16/2008    Years since quitting: 12.8   Smokeless tobacco: Never  Vaping Use   Vaping Use: Never used  Substance and Sexual Activity   Alcohol use: Not Currently    Comment: Occasional   Drug use: No   Sexual activity: Yes    Birth control/protection: Pill  Other Topics Concern   Not on file  Social History Narrative   Lives two roommates.     Social Determinants of Health   Financial Resource Strain: Not on file  Food Insecurity: Not on file  Transportation Needs: Not on file  Physical Activity: Not on file  Stress: Not on file  Social Connections: Not on file   Family History  Problem  Relation Age of Onset   Hyperlipidemia Mother    Dementia Mother    Scheduled Meds:  mouth rinse  15 mL Mouth Rinse 10 times per day   Continuous Infusions:  HYDROmorphone     PRN Meds:.alteplase, antiseptic oral rinse, artificial tears, glycopyrrolate **OR** glycopyrrolate **OR** glycopyrrolate, haloperidol **OR** haloperidol **OR** haloperidol lactate, HYDROmorphone, LORazepam **OR** LORazepam **OR** LORazepam, ondansetron (ZOFRAN) IV, sodium chloride flush Allergies  Allergen Reactions   Cleocin [Clindamycin Hcl] Diarrhea   Lisinopril Other (See Comments)    Elevated potassium per pt report   Amoxicillin Diarrhea, Nausea Only and Other (See Comments)    Did it involve swelling of the face/tongue/throat, SOB, or low BP? No Did it involve sudden or severe rash/hives, skin peeling, or any reaction on the inside of your mouth or nose? No Did you need to seek medical attention at a hospital or doctor's office? No When did it last happen?      10 + years If all above answers are "NO", may proceed with cephalosporin  use.     Bactrim [Sulfamethoxazole-Trimethoprim] Diarrhea and Nausea Only   Review of Systems  Unable to perform ROS: Intubated   Physical Exam Constitutional:      Comments: unresponsive  Cardiovascular:     Rate and Rhythm: Tachycardia present.  Pulmonary:     Comments: Remains on vent   Vital Signs: BP (!) 128/50    Pulse (!) 103    Temp 99 F (37.2 C) (Oral)    Resp (!) 22    Ht _0  (1.676 m)    Wt 56.7 kg    SpO2 92%    BMI 20.18 kg/m  Pain Scale: CPOT   Pain Score: Asleep (used for FEVER)   SpO2: SpO2: 92 % O2 Device:SpO2: 92 % O2 Flow Rate: .O2 Flow Rate (L/min): 6 L/min  IO: Intake/output summary:  Intake/Output Summary (Last 24 hours) at 04/08/21 1640 Last data filed at 08-Apr-2021 0700 Gross per 24 hour  Intake 953 ml  Output --  Net 953 ml    LBM: Last BM Date: 04-08-2021 Baseline Weight: Weight: 57.4 kg Most recent weight: Weight: 56.7  kg     Palliative Assessment/Data: PPS 10%   Time Total: 90 minutes Greater than 50%  of this time was spent counseling and coordinating care related to the above assessment and plan.  Juel Burrow, DNP, AGNP-C Palliative Medicine Team [phone] Pager: [phone]

## 2021-04-02 NOTE — Procedures (Signed)
Extubation Procedure Note  Patient Details:   Name: [name] DOB: 1971-01-27 MRN: [id number]   Airway Documentation:  Airway 7.5 mm (Active)  Secured at (cm) 50 cm 04/22/21 2050  Measured From Lips 22-Apr-2021 2000  Secured Location Left 22-Apr-2021 1206  Secured By [employer] 2021/04/22 2000  Tube Holder Repositioned Yes 04/22/2021 1206  Prone position No Apr 22, 2021 1206  Cuff Pressure (cm H2O) [place] OR 18-26 [facility] Apr 22, 2021 0723  Site Condition Dry April 22, 2021 2000   Vent end date: 04/22/21 Vent end time: 2050   Evaluation  O2 sats: currently acceptable Complications: No apparent complications Patient did tolerate procedure well. Bilateral Breath Sounds: Rhonchi, Diminished  Terminal extubation  No  Ulice Dash 04/22/21, 8:51 PM

## 2021-04-02 NOTE — Progress Notes (Signed)
Patient desynchronous with the ventilator and breath stacking. Placed patient on pressure support of 15cnH20. Patients tidal volume 800,Ve=8.8, RR 16-20 with RSBI=24. Will continue to monitor patient.

## 2021-04-02 NOTE — Progress Notes (Signed)
[place] Progress Note Patient Name: [name] DOB: 23-Jul-1970 MRN: [id number]   Date of Service  04/19/21  HPI/Events of Note  Patient compassionately extubated earlier today and now actively dying, family requesting a little more sedation to suppress air hunger.  eICU Interventions  Lorazepam 2 mg iv x 1 ordered.        Kerry Kass Arisbeth Purrington Apr 19, 2021, 9:21 PM

## 2021-04-02 NOTE — Progress Notes (Signed)
NAME:  [name], MRN:  [id number], DOB:  [birth year], LOS: 8 ADMISSION DATE:  03/03/2021, CONSULTATION DATE:  12/22 REFERRING MD:  Tyrone Nine, CHIEF COMPLAINT:  AMS/HHS   History of Present Illness:  [name] is a 51 year old lady with PMH significant for type 1 DM on insulin pump, ESRD on HD among multiple medical conditions listed in [place]. Patient presented to the ED with altered mental status.  It was reported that the patient had been last seen around 0930 AM on day of presentation. The patient's sister texted her several times but received no response.  Hence the sister called one of the patient's roommate and asked him to check on her.  She was found confused and incoherent. She was brought to the ED for further evaluation.   In the Ed the patient was found to be severely hyperglycemic with BS > 900. She was initially referred to the hospitalist team for admission. However her mental status worsened  so CCM team was asked to admit her. It was reported that the patient had episodes of nausea and vomiting en route to hospital. The nurses described her as incoherent but by the time PCCM was called to see her, she was unresponsive with episodes of grunting. She could not contribute to this consultation.   The patient's sister [name] who contributed to this consultation by phone reported that the patient has had a similar episode like this in the past.  [name] reported that it took her sister several day before she became coherent.  Pertinent  Medical History  T1DM on insulin pump ESRD on HD T/Th/S Previous DVT Hyperlipidemia HTN Diabetic neuropathy OSA PVD  Significant Hospital Events: Including procedures, antibiotic start and stop dates in addition to other pertinent events   12/22 admitted to ICU, patient intubated and started on insulin drip, had dialysis 12/23 off insulin gtt, still not awake failed SBT 12/24 extubated and required reinbuation due to cheynes stokes  respirations 12/25 got HD with 2.5L off. code stroke called in evening 12/27 tolerated hemodialysis well, did decompensate towards the end of the dialysis session 12/29 off sedation, doing poorly neurologically 12/30 no changes, ongoing goals of care discussions  Interim History / Subjective:  Off sedation, unchanged neurologically No response to painful stimuli On pressor support ventilation  MRI with multiple findings-signal abnormality which may be in the context of a stroke or ischemic injury.  CT with brain edema repeat MRI is unchanged from previous  Objective   Blood pressure (!) 175/52, pulse 93, temperature 98.1 F (36.7 C), temperature source Oral, resp. rate 18, height 5\' 6"  (1.676 m), weight 56.7 kg, SpO2 100 %.    Vent Mode: CPAP;PSV FiO2 (%):  [40 %-50 %] 40 % Set Rate:  [12 bmp] 12 bmp Vt Set:  [480 mL] 480 mL PEEP:  [5 cmH20] 5 cmH20 Pressure Support:  [15 cmH20] 15 cmH20 Plateau Pressure:  [11 cmH20-15 cmH20] 15 cmH20   Intake/Output Summary (Last 24 hours) at 2021-04-21 0902 Last data filed at Apr 21, 2021 0700 Gross per 24 hour  Intake 1303 ml  Output 2000 ml  Net -697 ml   Filed Weights   03/30/21 1334 03/30/21 1634 04/21/2021 0500  Weight: 59.6 kg 58 kg 56.7 kg    Examination: Gen:   Acutely ill-appearing HEENT: Endotracheal tube in place Lungs:    Decreased air movement bilaterally CV:         S1-S2 appreciated Abd:      Bowel sounds appreciated  Ext:    Mild extremity edema Skin:      Warm and dry; no rashes Neuro:   Off sedation, no motor response elicitable today  Labs/imaging reviewed: MRI results noted  Resolved Hospital Problem list     Assessment & Plan:   Ongoing goals of care discussion secondary to poor prognosis from a neurological perspective -No significant improvement -MRI findings with significant signal changes -Family coming in today for further discussions  Acute hypoxemic respiratory failure secondary to acute pulmonary  edema -Failed extubation attempt on 12/24 due to pulmonary edema, Cheyne-Stokes respirations -Remains on full ventilator support -Encephalopathy is a barrier to extubation  Acute metabolic encephalopathy secondary to HHS Code stroke called 12/25, left gaze preference noted at the time -Has had CT, MRI -CT , MRI consistent with edema, significant signal changes on MRI which may be related to hypoxemia/ischemic injury -Appreciate neurology follow-up -Note prognostication from neurology  End-stage renal disease on hemodialysis -Temporary dialysis catheter placed left IJ 12/27 -Dialysis per nephrology  HHS Type 1 diabetes on insulin pump -Currently on SSI and Levemir  History of hypertension -Home antihypertensives on hold  Hyperlipidemia -Pravastatin 40 mg daily  Anemia -Anemia of chronic disease  Elevated LFTs -Probably related to hepatic congestion  Probable pneumonia -On cefepime, may stop at 5 days  Not doing well neurologically Risk of decompensation remains high Still remains profoundly encephalopathic Prognosis is very guarded Goals of care discussion with family today Consult to palliative care  Best Practice (right click and "Reselect all SmartList Selections" daily)   Diet/type: tubefeeds  DVT prophylaxis: prophylactic heparin  GI prophylaxis: PPI Lines: N/A Foley:  N/A Code Status:  full code Last date of multidisciplinary goals of care discussion [sister [name] updated 90/24 over the phone.]  Labs   CBC: Recent Labs  Lab 03/26/21 0140 03/26/21 2339 03/27/21 0648 03/28/21 0100 03/29/21 0357 03/30/21 0737  WBC 6.8  --  11.5* 14.7* 16.0* 17.5*  HGB 8.6* 11.6* 9.7* 8.9* 8.8* 8.4*  HCT 28.4* 34.0* 33.3* 29.9* 28.3* 26.3*  MCV 82.6  --  82.2 81.0 79.3* 76.5*  PLT 231  --  364 328 340 097    Basic Metabolic Panel: Recent Labs  Lab 03/24/21 1653 03/24/21 2022 03/26/21 0140 03/26/21 0932 03/26/21 2339 03/27/21 0648 03/29/21 0357  03/30/21 0737  NA 134*   < > 131* 133* 126* 134* 134* 130*  K 4.0   < > 2.6* 5.0 5.4* 5.3* 5.2* 4.4  CL 98   < > 93* 96*  --  92* 96* 92*  CO2 25   < > 27 24  --  24 27 25   GLUCOSE 120*   < > 86 279*  --  290* 224* 174*  BUN 31*   < > 9 28*  --  51* 35* 75*  CREATININE 4.67*   < > 1.72* 4.48*  --  6.19* 4.09* 5.43*  CALCIUM 8.4*   < > 6.9* 8.1*  --  8.4* 8.4* 8.2*  MG 2.1  --   --   --   --  2.8* 2.4 2.7*  PHOS 5.6*  --   --   --   --   --   --  5.9*   < > = values in this interval not displayed.   GFR: Estimated Creatinine Clearance: 11.2 mL/min (A) (by C-G formula based on SCr of 5.43 mg/dL (H)). Recent Labs  Lab 03/27/21 0648 03/28/21 0100 03/29/21 0357 03/30/21 0737  WBC 11.5* 14.7* 16.0* 17.5*  Liver Function Tests: Recent Labs  Lab April 10, 2021 0518  AST 156*  ALT 104*  ALKPHOS 964*  BILITOT 0.6  PROT 6.4*  ALBUMIN 1.6*    No results for input(s): LIPASE, AMYLASE in the last 168 hours. No results for input(s): AMMONIA in the last 168 hours.  ABG    Component Value Date/Time   PHART 7.245 (L) 03/26/2021 2339   PCO2ART 41.9 03/26/2021 2339   PO2ART 56 (L) 03/26/2021 2339   HCO3 18.2 (L) 03/26/2021 2339   TCO2 19 (L) 03/26/2021 2339   ACIDBASEDEF 9.0 (H) 03/26/2021 2339   O2SAT 84.0 03/26/2021 2339     Coagulation Profile: Recent Labs  Lab 04/10/21 0518  INR 1.1    Cardiac Enzymes: No results for input(s): CKTOTAL, CKMB, CKMBINDEX, TROPONINI in the last 168 hours.  HbA1C: Hemoglobin A1C  Date/Time Value Ref Range Status  11/02/2020 10:26 AM 8.1 (A) 4.0 - 5.6 % Final  02/17/2020 01:32 PM 9.4 (A) 4.0 - 5.6 % Final  10/22/2019 12:00 AM 8.4  Final   Hgb A1c MFr Bld  Date/Time Value Ref Range Status  03/23/2021 04:44 AM 7.8 (H) 4.8 - 5.6 % Final    Comment:    (NOTE) Pre diabetes:          5.7%-6.4%  Diabetes:              >6.4%  Glycemic control for   <7.0% adults with diabetes   07/30/2018 01:10 AM 10.4 (H) 4.8 - 5.6 % Final    Comment:     (NOTE) Pre diabetes:          5.7%-6.4% Diabetes:              >6.4% Glycemic control for   <7.0% adults with diabetes     CBG: Recent Labs  Lab 03/30/21 1504 03/30/21 1917 03/30/21 2305 04-10-21 0310 04-10-21 0706  GLUCAP 115* 137* 253* 237* 252*   The patient is critically ill with multiple organ systems failure and requires high complexity decision making for assessment and support, frequent evaluation and titration of therapies, application of advanced monitoring technologies and extensive interpretation of multiple databases. Critical Care Time devoted to patient care services described in this note independent of APP/resident time (if applicable)  is 35 minutes.   Sherrilyn Rist MD [facility] Personal pager: See Amion If unanswered, please page CCM On-call: [phone]

## 2021-04-02 DEATH — deceased

## 2021-04-17 ENCOUNTER — Other Ambulatory Visit: Payer: Self-pay | Admitting: Endocrinology

## 2021-04-17 DIAGNOSIS — E7800 Pure hypercholesterolemia, unspecified: Secondary | ICD-10-CM

## 2021-05-03 NOTE — Death Summary Note (Signed)
DEATH SUMMARY   Patient Details  Name: [name] MRN: [id number] DOB: 03/13/1971  Admission/Discharge Information   Admit Date:  03/19/2021  Date of Death: Date of Death: 04/09/2021  Time of Death: Time of Death: June 25, 2119  Length of Stay: 06-17-2022  Referring Physician: Loyola Mast, PA-C   Reason(s) for Hospitalization  Acute metabolic encephalopathy  Diagnoses  Preliminary cause of death: Hyperglycemic Hyperosmolar State Secondary Diagnoses (including complications and co-morbidities):  Principal Problem:   Hyperosmolar hyperglycemic state (HHS) ([place]) Active Problems:   Type 1 diabetes mellitus with hyperosmolar hyperglycemic state (HHS) ([place]) Respiratory Failure ESRD HLD   Brief Hospital Course (including significant findings, care, treatment, and services provided and events leading to death)  [name] was a 51 year old lady with PMH significant for type 1 DM on insulin pump, ESRD on HD among multiple medical conditions listed in [place]. Patient presented to the ED with altered mental status.  It was reported that the patient had been last seen around 0930 AM on day of presentation. The patient's sister texted her several times but received no response.  Hence the sister called one of the patient's roommate and asked him to check on her.  She was found confused and incoherent. She was brought to the ED for further evaluation.   In the Ed the patient was found to be severely hyperglycemic with BS > 900. She was admitted to the ICU for HHS and encephalopathy. She was intubated for airway protection. She was treated with insulin gtt and received dialysis for volume and toxin removal. She was extubated 12/24 but failed immediately due to respiratory distress, cheynes strokes respirations. Reintubated. Received ongoing HD sessions but then had worsening encephalopathy and hypotension after dialysis on 12/27. 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  Given the gradual decline in her overall health and failure to improve despite aggressive measures the decision was made to transition to comfort measures. She passed away on 04-09-2021.   Pertinent Labs and Studies  Significant Diagnostic Studies DG Abd 1 View  Result Date: 03/27/2021 CLINICAL DATA:  NG tube placement EXAM: ABDOMEN - 1 VIEW COMPARISON:  03/26/2021 FINDINGS: Enteric tube terminates in the gastric antrum. Nonobstructive bowel gas pattern. IMPRESSION: Enteric tube terminates in the gastric antrum. Electronically Signed   By: Julian Hy M.D.   On: 03/27/2021 06:42   CT HEAD WO CONTRAST (5MM)  Result Date: 03/29/2021 CLINICAL DATA:  Encephalopathy EXAM: CT HEAD WITHOUT CONTRAST TECHNIQUE: Contiguous axial images were obtained from the base of the skull through the vertex without intravenous contrast. COMPARISON:  Brain MRI from 2 days ago FINDINGS: Brain: Sulcal effacement and subtle cortical low-density primarily seen in the right cerebral hemisphere, correlating with prior brain MRI. No hemorrhage, hydrocephalus, or collection. Vascular: Negative Skull: Sclerotic appearance which is likely from end-stage renal disease. Sinuses/Orbits: Bilateral mastoid opacification in the setting of intubation and nasopharyngeal fluid. IMPRESSION: Cortical edema best seen in the right cerebral hemisphere, reference preceding brain MRI. No hemorrhage or shift. Electronically Signed   By: Jorje Guild M.D.   On: 03/29/2021 09:50   CT Head Wo Contrast  Result Date: 03/23/2021 CLINICAL DATA:  Delirium. EXAM: CT HEAD WITHOUT CONTRAST TECHNIQUE: Contiguous axial images were obtained from the base of the skull through the vertex without intravenous  contrast.  COMPARISON:  11/21/2020. FINDINGS: Brain: No acute intracranial hemorrhage, midline shift or mass effect. No extra-axial fluid collection. Periventricular white matter hypodensities are noted bilaterally. There is no hydrocephalus. Vascular: No hyperdense vessel. Atherosclerotic calcification of the carotid siphons and vertebral arteries. Skull: Normal. Negative for fracture or focal lesion. Sinuses/Orbits: Diffuse paranasal sinus mucosal thickening. No acute abnormality. Other: Tubes are present in the oral cavity. IMPRESSION: 1. No acute intracranial process. 2. Periventricular white matter hypodensities, may be associated with chronic microvascular ischemic changes. Electronically Signed   By: Brett Fairy M.D.   On: 03/23/2021 00:55   MR BRAIN WO CONTRAST  Result Date: 03/30/2021 CLINICAL DATA:  Mental status change with unknown cause EXAM: MRI HEAD WITHOUT CONTRAST TECHNIQUE: Multiplanar, multiecho pulse sequences of the brain and surrounding structures were obtained without intravenous contrast. COMPARISON:  Brain MRI from 3 days ago FINDINGS: Brain: Restricted diffusion involving the cortex of the right more than left cerebral hemisphere, most heavily affecting the posterior circulation distributions but certainly seen in the anterior circulation, especially on the right. This degree of diffusion restriction is brighter (which is likely from timing or scanner differences), but not progressed in extent. Both hippocampi are involved. Notable sparing of the perirolandic level, of undetermined significance in this setting. In the absence of cerebritis symptoms features - anoxia, hypoglycemia, or seizure complicated by infarct are leading considerations-although the asymmetry is atypical for these entities. No chart indication of posterior reversible encephalopathy risk factors. There is also limited midline upper cerebellar involvement which is atypical. No hemorrhage, hydrocephalus, or notable swelling.  Mild chronic white matter disease. Vascular: Major flow voids are preserved Skull and upper cervical spine: Normal marrow signal Sinuses/Orbits: Partial bilateral mastoid opacification. Adenoid thickening. Case discussed with Dr. Erlinda Hong. IMPRESSION: Unchanged extent of cortical restricted diffusion affecting the right more than left cerebral hemisphere, as discussed above. No evidence of new or reversible insult. Electronically Signed   By: Jorje Guild M.D.   On: 03/30/2021 11:05   MR BRAIN WO CONTRAST  Result Date: 03/27/2021 CLINICAL DATA:  Stroke, follow-up. Additional history provided: Patient found altered with hyperglycemia. EXAM: MRI HEAD WITHOUT CONTRAST TECHNIQUE: Multiplanar, multiecho pulse sequences of the brain and surrounding structures were obtained without intravenous contrast. COMPARISON:  Noncontrast head CT and CT angiogram head/neck 03/26/2021. Brain MRI 11/24/2020. FINDINGS: Brain: There is extensive cortically-based diffusion-weighted signal abnormality and corresponding T2 FLAIR hyperintense signal abnormality within the right cerebral hemisphere with involvement of the right ACA, right MCA and right PCA territories. Similar signal changes are also present within the left parietal and occipital lobes. Additionally, there is a subcentimeter focus of diffusion-weighted signal abnormality within the superomedial left cerebellar hemisphere (series 2, image 20). These findings are nonspecific but may reflect acute infarcts, sequela of acute hypoxic/ischemic injury, seizure-related signal changes or a combination there of. There is no significant mass effect at this time. Mild chronic small vessel ischemic changes within the cerebral white matter. Redemonstrated chronic small-vessel infarct within the left centrum semiovale/corona radiata. Chronic microhemorrhages versus mineralization within the cerebellar dentate nuclei, bilaterally. No evidence of an intracranial mass. No extra-axial fluid  collection. No midline shift. Vascular: Maintained flow voids within the proximal large arterial vessels. Skull and upper cervical spine: No focal suspicious marrow lesion. Sinuses/Orbits: Visualized orbits show no acute finding. Bilateral lens replacements. Mild mucosal thickening within the bilateral ethmoid, sphenoid and maxillary sinuses. Other: Bilateral mastoid effusions. Fluid layering dependently within the nasopharynx. IMPRESSION: Diffusion-weighted and T2 FLAIR hyperintense signal abnormality within  the right cerebral hemisphere, left parietal and occipital lobes and superomedial left cerebellar hemisphere, as described. Involvement of the right cerebral hemisphere is extensive. These signal changes are nonspecific but may reflect acute infarcts, acute hypoxic/ischemic injury, seizure-related signal changes or a combination thereof. No significant mass effect at this time. Mild chronic small vessel ischemic changes within the cerebral white matter and pons. Redemonstrated chronic small-vessel infarct within the left centrum semiovale/corona radiata. Mild paranasal sinus disease, as outlined. Bilateral mastoid effusions. Electronically Signed   By: Kellie Simmering D.O.   On: 03/27/2021 21:38   DG CHEST PORT 1 VIEW  Result Date: 03/28/2021 CLINICAL DATA:  Encounter for central line care EXAM: PORTABLE CHEST 1 VIEW COMPARISON:  Yesterday FINDINGS: Endotracheal tube with tip just above the clavicular heads. The enteric tube reaches the stomach. Left IJ line with tip at the right atrium. Worsening bilateral lower lobe infiltrates, especially on the right. No visible effusion or pneumothorax. Normal heart size. IMPRESSION: 1. Unremarkable hardware. 2. Worsening bilateral lower lobe pneumonia. Electronically Signed   By: Jorje Guild M.D.   On: 03/28/2021 11:16   DG Chest Port 1 View  Result Date: 03/27/2021 CLINICAL DATA:  51 year old female code stroke presentation. Intubated. EXAM: PORTABLE CHEST 1  VIEW COMPARISON:  Portable chest 0133 hours today and earlier. FINDINGS: Portable AP semi upright view at 0617 hours. Endotracheal tube tip remains in good position. Stable enteric tube which courses to the abdomen, tip not included. Decreased veiling opacity and improved ventilation at the right lung base, but increased confluent and streaky opacity at the left lung base since 0133 hours today. No pulmonary edema. No pneumothorax. Probable small residual pleural effusions. Mediastinal contours remain within normal limits. Stable left axillary vascular stent. No acute osseous abnormality identified. Paucity of bowel gas. IMPRESSION: 1.  Stable lines and tubes. 2. Confluent increased opacity at the left lung base since 0133 hours today, more resembles pneumonia than atelectasis. 3. But interval improved right lung ventilation and decreased radiographic appearance of pleural effusions. Electronically Signed   By: Genevie Ann M.D.   On: 03/27/2021 06:44   DG CHEST PORT 1 VIEW  Result Date: 03/27/2021 CLINICAL DATA:  Endotracheal tube. EXAM: PORTABLE CHEST 1 VIEW COMPARISON:  03/25/2021. FINDINGS: The heart is enlarged and the mediastinal structures are stable. The pulmonary vasculature is stable. Patchy airspace disease is present at the right lung base and there is a small right pleural effusion. The left lung is clear. No pneumothorax. No acute osseous abnormality. The endotracheal tube terminates 4.6 cm above the carina. An enteric tube courses over the stomach and out of the field of view. IMPRESSION: 1. Small right pleural effusion with atelectasis or infiltrate at the right lung base. 2. Cardiomegaly. 3. Medical devices as described above. Electronically Signed   By: Brett Fairy M.D.   On: 03/27/2021 01:43   DG CHEST PORT 1 VIEW  Result Date: 03/25/2021 CLINICAL DATA:  Respiratory failure, ventilatory support EXAM: PORTABLE CHEST 1 VIEW COMPARISON:  03/23/2021 FINDINGS: Endotracheal tube 4.7 cm above the  carina. NG tube extends below the hemidiaphragms into the stomach. Stomach remains air distended. Stable cardiomegaly with slightly lower lung volumes and bibasilar atelectasis, worse on the left. No enlarging effusion. No pneumothorax. IMPRESSION: Stable cardiomegaly. Lower lung volumes with increased basilar atelectasis. Electronically Signed   By: Jerilynn Mages.  Shick M.D.   On: 03/25/2021 14:15   DG Chest Port 1 View  Result Date: 03/23/2021 CLINICAL DATA:  Intubation. EXAM: PORTABLE CHEST 1  VIEW COMPARISON:  03/28/2021. FINDINGS: The heart is mildly enlarged and the mediastinal structures are within normal limits. Pulmonary vascular congestion is noted bilaterally. Interstitial prominence is noted bilaterally and patchy airspace disease is seen at the left lung base. There is a small left pleural effusion. No pneumothorax is seen. A vascular stent is noted in the left axilla. An enteric tube courses over the stomach and out of the field of view. The endotracheal tube terminates 1 cm above the carina. No acute osseous abnormality. IMPRESSION: 1. Cardiomegaly with pulmonary vascular congestion. 2. Interstitial prominence bilaterally with patchy airspace disease at the lung bases, possible edema, atelectasis, or infiltrate. 3. Small left pleural effusion. 4. Medical devices as described above. Electronically Signed   By: Brett Fairy M.D.   On: 03/23/2021 00:37   DG Chest Portable 1 View  Result Date: 03/20/2021 CLINICAL DATA:  Altered mental status, initial encounter EXAM: PORTABLE CHEST 1 VIEW COMPARISON:  11/21/2020 FINDINGS: Neva Seat is enlarged but stable. Aortic calcifications are noted. Lungs are well aerated bilaterally. Vascular congestion is seen with mild interstitial edema. No focal infiltrate or effusion is seen. No bony abnormality is noted. IMPRESSION: Changes consistent with mild CHF. No other focal abnormality is noted. Electronically Signed   By: Inez Catalina M.D.   On: 03/12/2021 20:54   DG Abd  Portable 1V  Result Date: 03/26/2021 CLINICAL DATA:  Check gastric catheter placement EXAM: PORTABLE ABDOMEN - 1 VIEW COMPARISON:  None. FINDINGS: Gastric catheter is noted within the stomach. No free air is seen. No obstructive changes are noted. IMPRESSION: Gastric catheter within the stomach. Electronically Signed   By: Inez Catalina M.D.   On: 03/26/2021 01:37   Overnight EEG with video  Result Date: 03/27/2021 Lora Havens, MD     03/28/2021  9:18 AM Patient Name: [name] MRN: [id number] Epilepsy Attending: Lora Havens Referring Physician/Provider: Dr Donnetta Simpers Duration: 03/26/2021 2222 to 03/27/2021 2222 Patient history: 51 y.o. female with hx of Type 1DM, ESRD on HD, prior DVTs, HLD, HTN, OSA, PVD who is admitted with poor responsiveness in the setting of significant hyperglycemia. EEG to evaluate for seizure Level of alertness: Awake AEDs during EEG study: None Technical aspects: This EEG study was done with scalp electrodes positioned according to the 10-20 International system of electrode placement. Electrical activity was acquired at a sampling rate of 500Hz  and reviewed with a high frequency filter of 70Hz  and a low frequency filter of 1Hz . EEG data were recorded continuously and digitally stored. Description: At the beginning of the study, EEG showed  posterior dominant rhythm of 8Hz  activity of moderate voltage (25-35 uV) seen predominantly in posterior head regions, asymmetric ( right<left) and reactive to eye opening and eye closing. EEG also showed continuous low amplitude 3 to 6 Hz theta-delta slowing in right hemisphere as well as intermittent rhythmic 2-3hz  delta slowing. Gradually, EEG showed continuous generalized and lateralized right hemisphere polymorphic sharply contoured 3-6hz  theta-delta slowing. Hyperventilation and photic stimulation were not performed. Of note, parts of study were difficult due to significant myogenic and electrode artifact.   ABNORMALITY - Continuous slow, generalized and lateralized right hemisphere - Background asymmetry, right<left IMPRESSION: This study is suggestive of cortical dysfunction arising from right hemisphere which could be secondary to underlying structural abnormality. Additionally there is moderate to severe diffuse encephalopathy, nonspecific etiology. No seizures or definite epileptiform discharges were seen throughout the recording. Lora Havens   ECHOCARDIOGRAM COMPLETE  Result Date: 03/28/2021  ECHOCARDIOGRAM REPORT   Patient Name:   REIGN DZIUBA Date of Exam: 03/28/2021 Medical Rec #:  [id number]          Height:       66.0 in Accession #:    [id number]         Weight:       135.6 lb Date of Birth:  1970/12/20          BSA:          1.695 m Patient Age:    27 years           BP:           120/56 mmHg Patient Gender: F                  HR:           84 bpm. Exam Location:  Inpatient Procedure: 2D Echo, Color Doppler and Cardiac Doppler Indications:    Stroke i63.9  History:        Patient has prior history of Echocardiogram examinations, most                 recent 11/24/2020. PAD; Risk Factors:Hypertension, Diabetes,                 Dyslipidemia, ESRD and Sleep Apnea.  Sonographer:    Raquel Sarna Senior RDCS Referring Phys: [id number] Rosalin Hawking  Sonographer Comments: Scanned supine on artificial respirator. IMPRESSIONS  1. Left ventricular ejection fraction, by estimation, is 60 to 65%. The left ventricle has normal function. The left ventricle has no regional wall motion abnormalities. Left ventricular diastolic parameters were normal.  2. Right ventricular systolic function is normal. The right ventricular size is normal. There is mildly elevated pulmonary artery systolic pressure.  3. The mitral valve is abnormal. No evidence of mitral valve regurgitation. No evidence of mitral stenosis.  4. Tricuspid valve regurgitation is moderate.  5. The aortic valve is tricuspid. There is mild calcification of the  aortic valve. Aortic valve regurgitation is not visualized. Aortic valve sclerosis is present, with no evidence of aortic valve stenosis.  6. The inferior vena cava is normal in size with greater than 50% respiratory variability, suggesting right atrial pressure of 3 mmHg. FINDINGS  Left Ventricle: Left ventricular ejection fraction, by estimation, is 60 to 65%. The left ventricle has normal function. The left ventricle has no regional wall motion abnormalities. The left ventricular internal cavity size was normal in size. There is  no left ventricular hypertrophy. Left ventricular diastolic parameters were normal. Right Ventricle: The right ventricular size is normal. No increase in right ventricular wall thickness. Right ventricular systolic function is normal. There is mildly elevated pulmonary artery systolic pressure. The tricuspid regurgitant velocity is 2.92  m/s, and with an assumed right atrial pressure of 8 mmHg, the estimated right ventricular systolic pressure is 38.2 mmHg. Left Atrium: Left atrial size was normal in size. Right Atrium: Right atrial size was normal in size. Pericardium: There is no evidence of pericardial effusion. Mitral Valve: The mitral valve is abnormal. There is mild thickening of the mitral valve leaflet(s). There is mild calcification of the mitral valve leaflet(s). No evidence of mitral valve regurgitation. No evidence of mitral valve stenosis. Tricuspid Valve: The tricuspid valve is normal in structure. Tricuspid valve regurgitation is moderate . No evidence of tricuspid stenosis. Aortic Valve: The aortic valve is tricuspid. There is mild calcification of the aortic valve. Aortic valve regurgitation is not visualized. Aortic  valve sclerosis is present, with no evidence of aortic valve stenosis. Pulmonic Valve: The pulmonic valve was normal in structure. Pulmonic valve regurgitation is not visualized. No evidence of pulmonic stenosis. Aorta: The aortic root is normal in size and  structure. Venous: The inferior vena cava is normal in size with greater than 50% respiratory variability, suggesting right atrial pressure of 3 mmHg. IAS/Shunts: No atrial level shunt detected by color flow Doppler.  LEFT VENTRICLE PLAX 2D LVIDd:         3.60 cm   Diastology LVIDs:         2.60 cm   LV e' medial:    9.03 cm/s LV PW:         1.00 cm   LV E/e' medial:  11.3 LV IVS:        1.00 cm   LV e' lateral:   13.10 cm/s LVOT diam:     2.00 cm   LV E/e' lateral: 7.8 LV SV:         96 LV SV Index:   57 LVOT Area:     3.14 cm  RIGHT VENTRICLE RV S prime:     11.30 cm/s TAPSE (M-mode): 2.1 cm LEFT ATRIUM             Index        RIGHT ATRIUM           Index LA diam:        2.90 cm 1.71 cm/m   RA Area:     13.60 cm LA Vol (A2C):   40.7 ml 24.01 ml/m  RA Volume:   31.50 ml  18.58 ml/m LA Vol (A4C):   39.4 ml 23.24 ml/m LA Biplane Vol: 40.2 ml 23.71 ml/m  AORTIC VALVE LVOT Vmax:   164.00 cm/s LVOT Vmean:  121.000 cm/s LVOT VTI:    0.307 m  AORTA Ao Root diam: 2.70 cm Ao Asc diam:  3.40 cm MITRAL VALVE                TRICUSPID VALVE MV Area (PHT): 3.10 cm     TR Peak grad:   34.1 mmHg MV Decel Time: 245 msec     TR Vmax:        292.00 cm/s MV E velocity: 102.00 cm/s MV A velocity: 88.50 cm/s   SHUNTS MV E/A ratio:  1.15         Systemic VTI:  0.31 m                             Systemic Diam: 2.00 cm Jenkins Rouge MD Electronically signed by Jenkins Rouge MD Signature Date/Time: 03/28/2021/12:10:33 PM    Final    CT HEAD CODE STROKE WO CONTRAST  Result Date: 03/26/2021 CLINICAL DATA:  Code stroke. Neuro deficit, acute, stroke suspected. EXAM: CT HEAD WITHOUT CONTRAST TECHNIQUE: Contiguous axial images were obtained from the base of the skull through the vertex without intravenous contrast. COMPARISON:  03/23/2021 FINDINGS: Brain: No focal abnormality affects the brainstem or cerebellum. Question of a small area of cortical low-density in the right occipital lobe could be a small occipital infarction. No other  acute or focal finding. No hemorrhage, hydrocephalus or extra-axial collection. Vascular: No abnormal vascular finding. Skull: Normal Sinuses/Orbits: Clear Other: None ASPECTS ([place] Stroke Program Early CT Score) - Ganglionic level infarction (caudate, lentiform nuclei, internal capsule, insula, M1-M3 cortex): 7 - Supraganglionic infarction (M4-M6 cortex): 3 Total score (  0-10 with 10 being normal): 10 IMPRESSION: 1. Question small area of occipital low-density on the right that could be a subacute occipital stroke. No swelling or hemorrhage. No evidence of MCA territory insult. 2. ASPECTS is 10 3. These results were communicated to Dr. Lorrin Goodell at 9:25 pm on 03/26/2021 by text page via the [facility] messaging system. Electronically Signed   By: Nelson Chimes M.D.   On: 03/26/2021 21:26   CT ANGIO HEAD NECK W WO CM (CODE STROKE)  Result Date: 03/26/2021 CLINICAL DATA:  Acute neurologic deficit EXAM: CT ANGIOGRAPHY HEAD AND NECK TECHNIQUE: Multidetector CT imaging of the head and neck was performed using the standard protocol during bolus administration of intravenous contrast. Multiplanar CT image reconstructions and MIPs were obtained to evaluate the vascular anatomy. Carotid stenosis measurements (when applicable) are obtained utilizing NASCET criteria, using the distal internal carotid diameter as the denominator. CONTRAST:  77mL OMNIPAQUE IOHEXOL 350 MG/ML SOLN COMPARISON:  None. FINDINGS: CTA NECK FINDINGS SKELETON: There is no bony spinal canal stenosis. No lytic or blastic lesion. OTHER NECK: Normal pharynx, larynx and major salivary glands. No cervical lymphadenopathy. Unremarkable thyroid gland. UPPER CHEST: Small pleural effusions with dependent atelectasis. AORTIC ARCH: There is no calcific atherosclerosis of the aortic arch. There is no aneurysm, dissection or hemodynamically significant stenosis of the visualized portion of the aorta. Conventional 3 vessel aortic branching pattern. The visualized  proximal subclavian arteries are widely patent. RIGHT CAROTID SYSTEM: Normal without aneurysm, dissection or stenosis. LEFT CAROTID SYSTEM: Normal without aneurysm, dissection or stenosis. VERTEBRAL ARTERIES: Left dominant configuration. Both origins are clearly patent. There is no dissection, occlusion or flow-limiting stenosis to the skull base (V1-V3 segments). CTA HEAD FINDINGS POSTERIOR CIRCULATION: --Vertebral arteries: Normal left dominant configuration of V4 segments. --Inferior cerebellar arteries: Normal. --Basilar artery: Normal. --Superior cerebellar arteries: Normal. --Posterior cerebral arteries (PCA): Normal. ANTERIOR CIRCULATION: --Intracranial internal carotid arteries: Atherosclerotic calcification of the internal carotid arteries at the skull base without hemodynamically significant stenosis. If --Anterior cerebral arteries (ACA): Normal. Both A1 segments are present. Patent anterior communicating artery (a-comm). --Middle cerebral arteries (MCA): Normal. VENOUS SINUSES: As permitted by contrast timing, patent. ANATOMIC VARIANTS: None Review of the MIP images confirms the above findings. IMPRESSION: 1. No emergent large vessel occlusion or high-grade stenosis of the intracranial arteries. 2. Small pleural effusions with dependent atelectasis. Electronically Signed   By: Ulyses Jarred M.D.   On: 03/26/2021 22:00    Microbiology No results found for this or any previous visit (from the past 240 hour(s)).  Lab Basic Metabolic Panel: No results for input(s): NA, K, CL, CO2, GLUCOSE, BUN, CREATININE, CALCIUM, MG, PHOS in the last 168 hours. Liver Function Tests: Recent Labs  Lab 30-Apr-2021 0518  AST 156*  ALT 104*  ALKPHOS 964*  BILITOT 0.6  PROT 6.4*  ALBUMIN 1.6*   No results for input(s): LIPASE, AMYLASE in the last 168 hours. No results for input(s): AMMONIA in the last 168 hours. CBC: No results for input(s): WBC, NEUTROABS, HGB, HCT, MCV, PLT in the last 168 hours. Cardiac  Enzymes: No results for input(s): CKTOTAL, CKMB, CKMBINDEX, TROPONINI in the last 168 hours. Sepsis Labs: No results for input(s): PROCALCITON, WBC, LATICACIDVEN in the last 168 hours.  Lenice Llamas, MD Pulmonary and [place] 04/06/2021 5:29 PM Pager: see AMION  If no response to pager, please call critical care on call (see AMION) until 7pm After 7:00 pm call Elink

## 2021-09-05 IMAGING — CT CT RENAL STONE PROTOCOL
2 of 4 series · 16 of 46 positions shown, 18 images · non-contrast
Comparison: February 01, 2014

CLINICAL DATA: Ribcage pain

EXAM:
CT ABDOMEN AND PELVIS WITHOUT CONTRAST
TECHNIQUE: Multidetector CT imaging of the abdomen and pelvis was performed
following the standard protocol without IV contrast.

[Series 2: axial st · axial · 0.77mm/px · z∈[+370,+800]mm · 13 of 94 slices shown, 15 images]
[im 4/94  soft-tissue]
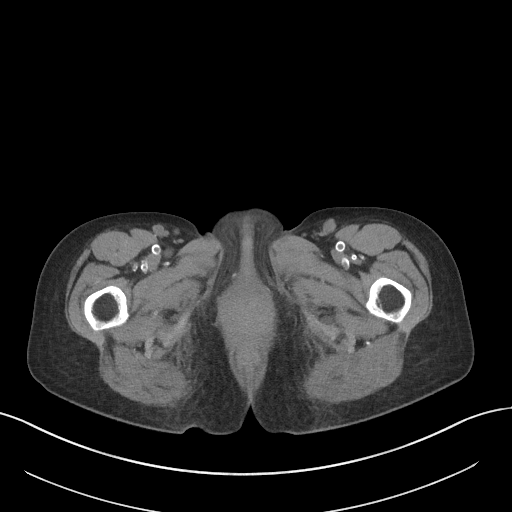
[im 4/94  bone]
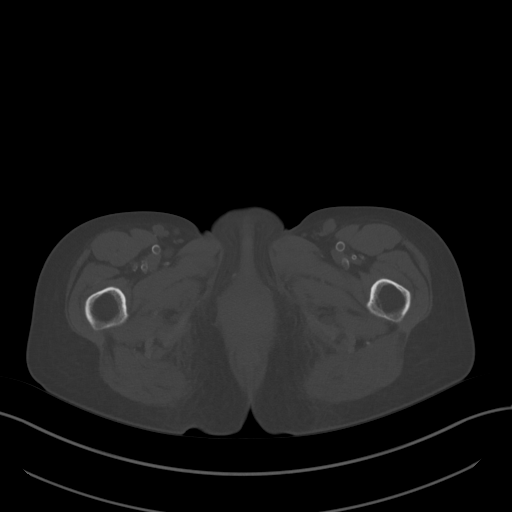
[im 12/94  soft-tissue]
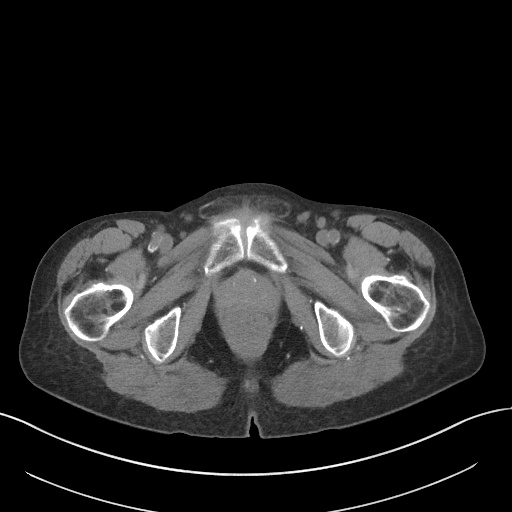
[im 20/94  soft-tissue]
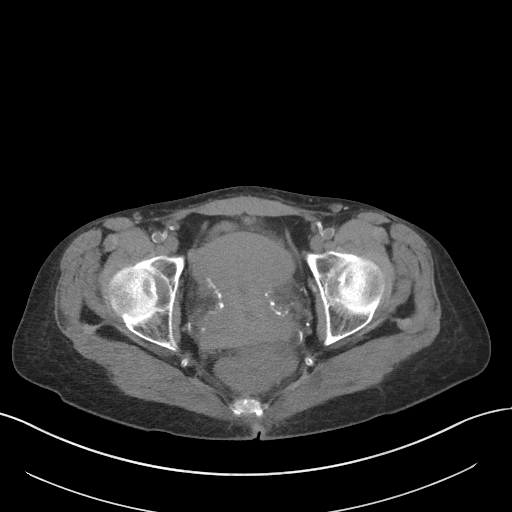
[im 28/94  soft-tissue]
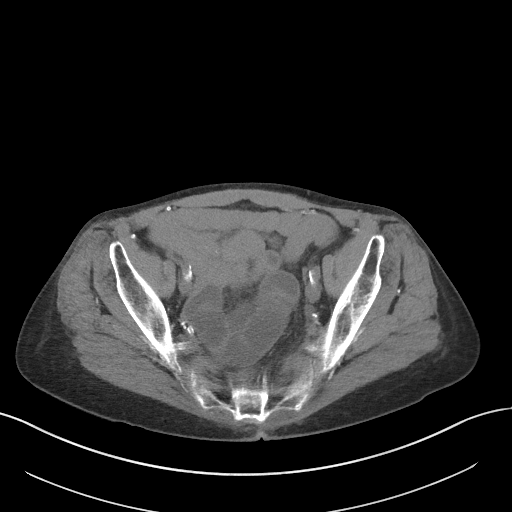
[im 32/94  soft-tissue]
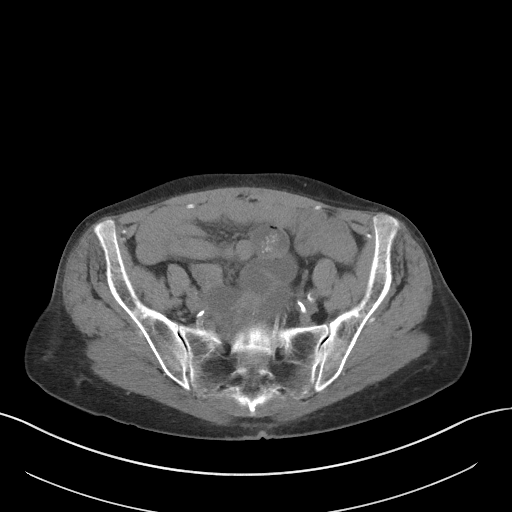
[im 39/94  soft-tissue]
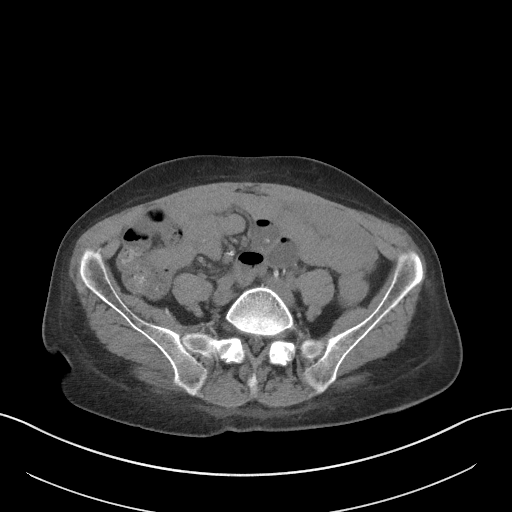
[im 47/94  soft-tissue]
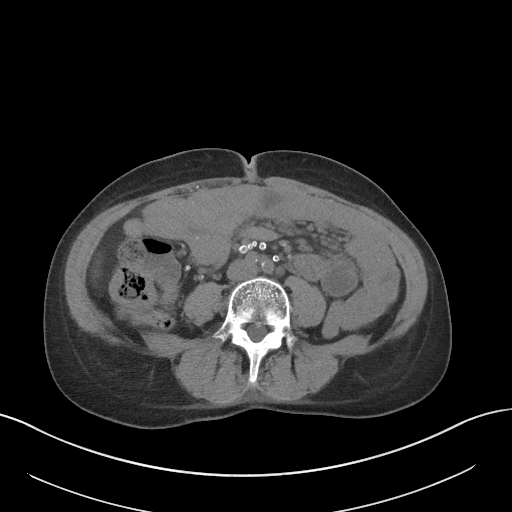
[im 55/94  soft-tissue]
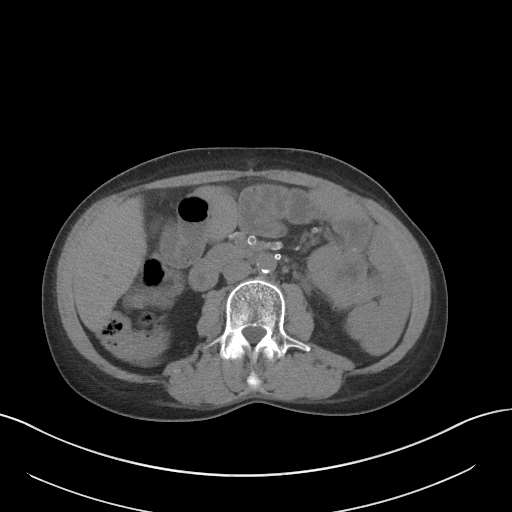
[im 63/94  soft-tissue]
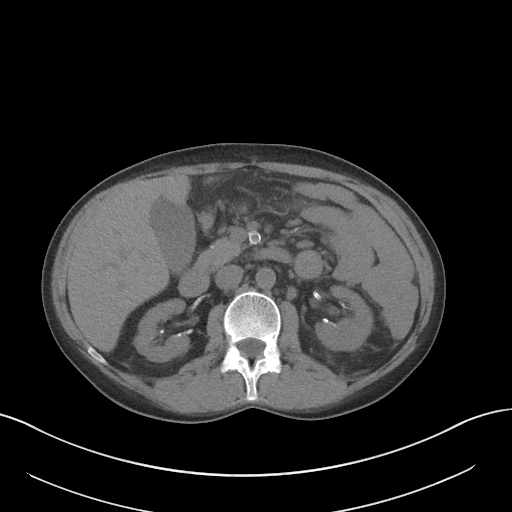
[im 63/94  bone]
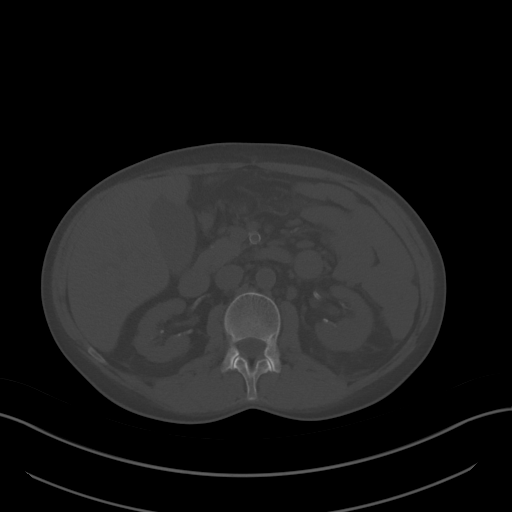
[im 66/94  soft-tissue]
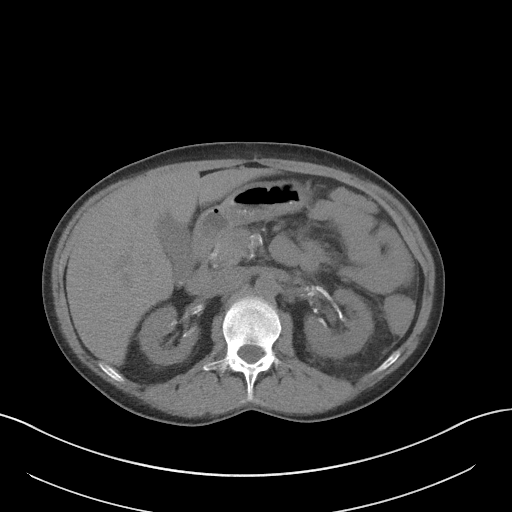
[im 74/94  soft-tissue]
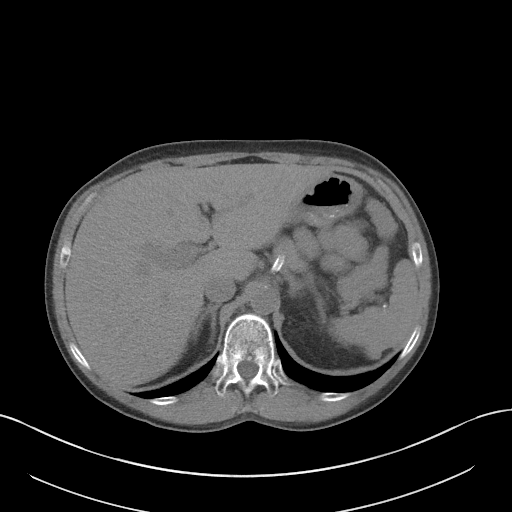
[im 82/94  soft-tissue]
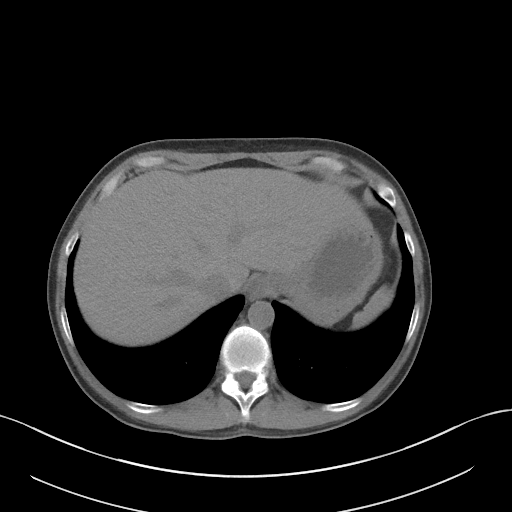
[im 90/94  soft-tissue]
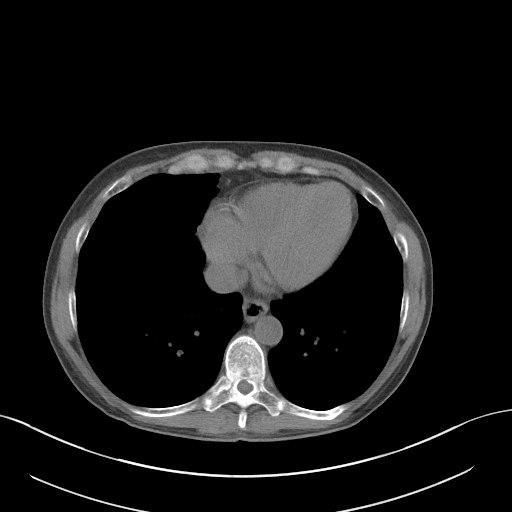

[Series 5: coronal st · coronal · 0.75mm/px · 3 of 80 slices shown]
[im 27/80  soft-tissue]
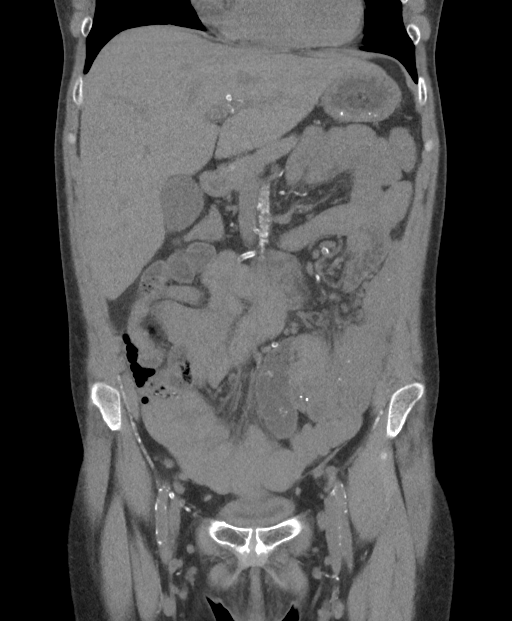
[im 36/80  soft-tissue]
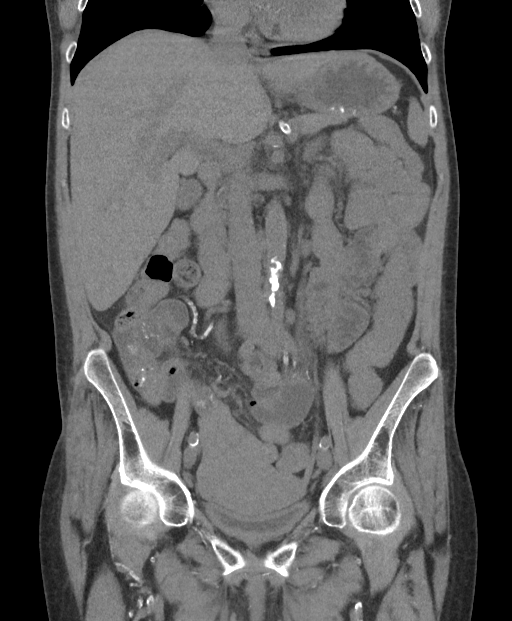
[im 44/80  soft-tissue]
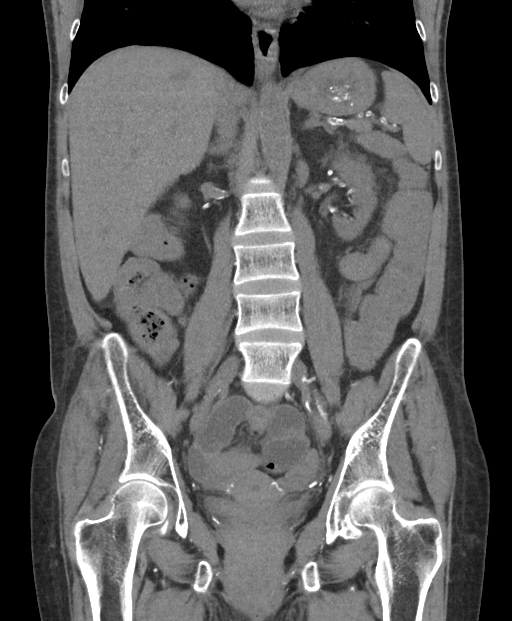

[16 of 46 positions shown; findings below may reference images not displayed]

FINDINGS: Evaluation is limited secondary to lack of IV contrast.

Lower chest: No acute abnormality.  Dense breast tissue.

Hepatobiliary: Cholelithiasis. Unremarkable noncontrast appearance
of the liver.

Pancreas: No peripancreatic fat stranding.

Spleen: Unremarkable.

Adrenals/Urinary Tract: Adrenal glands are unremarkable. Atrophic
appearance of bilateral kidneys. The bladder is mildly thick-walled
but decompressed. No nephrolithiasis visualized. The LEFT ureter is
mildly prominent with mild fat stranding at the level of the UVJ.

Stomach/Bowel: No evidence of bowel obstruction. The colon is
decompressed. Mild RIGHT-sided colonic stool burden.

Vascular/Lymphatic: Extensive vascular calcifications, markedly
progressed since 6670. Mild increase in size of LEFT retroperitoneal
lymph nodes. LEFT perirenal lymph node measures 9 mm in the short
axis, previously 5 mm (series 2, image 27). Additional LEFT
perirenal lymph node measures 8 mm in the short axis, previously 5
mm (series 2, image 33).

Reproductive: Uterus and bilateral adnexa are unremarkable.

Other: No free air or free fluid.

Musculoskeletal: No acute osseous abnormality.
IMPRESSION: 1. Mild prominence of the LEFT ureter with mild fat stranding at the
level of the UVJ as well as increase in size of LEFT retroperitoneal
subcentimeter perirenal lymph nodes. No nephrolithiasis visualized.
Findings could reflect sequela of passage prior nephrolithiasis
versus infection. Recommend correlation with urine analysis.
2. Extensive vascular calcifications, markedly progressed since

Aortic Atherosclerosis (EB96D-ZCE.E).

## 2022-04-18 IMAGING — DX DG CHEST 1V PORT
1 series · 1 of 1 positions shown · non-contrast
Comparison: Chest x-ray 08/06/2018.

CLINICAL DATA: 50-year-old female with possible sepsis.

EXAM:
PORTABLE CHEST 1 VIEW

[chest ap]
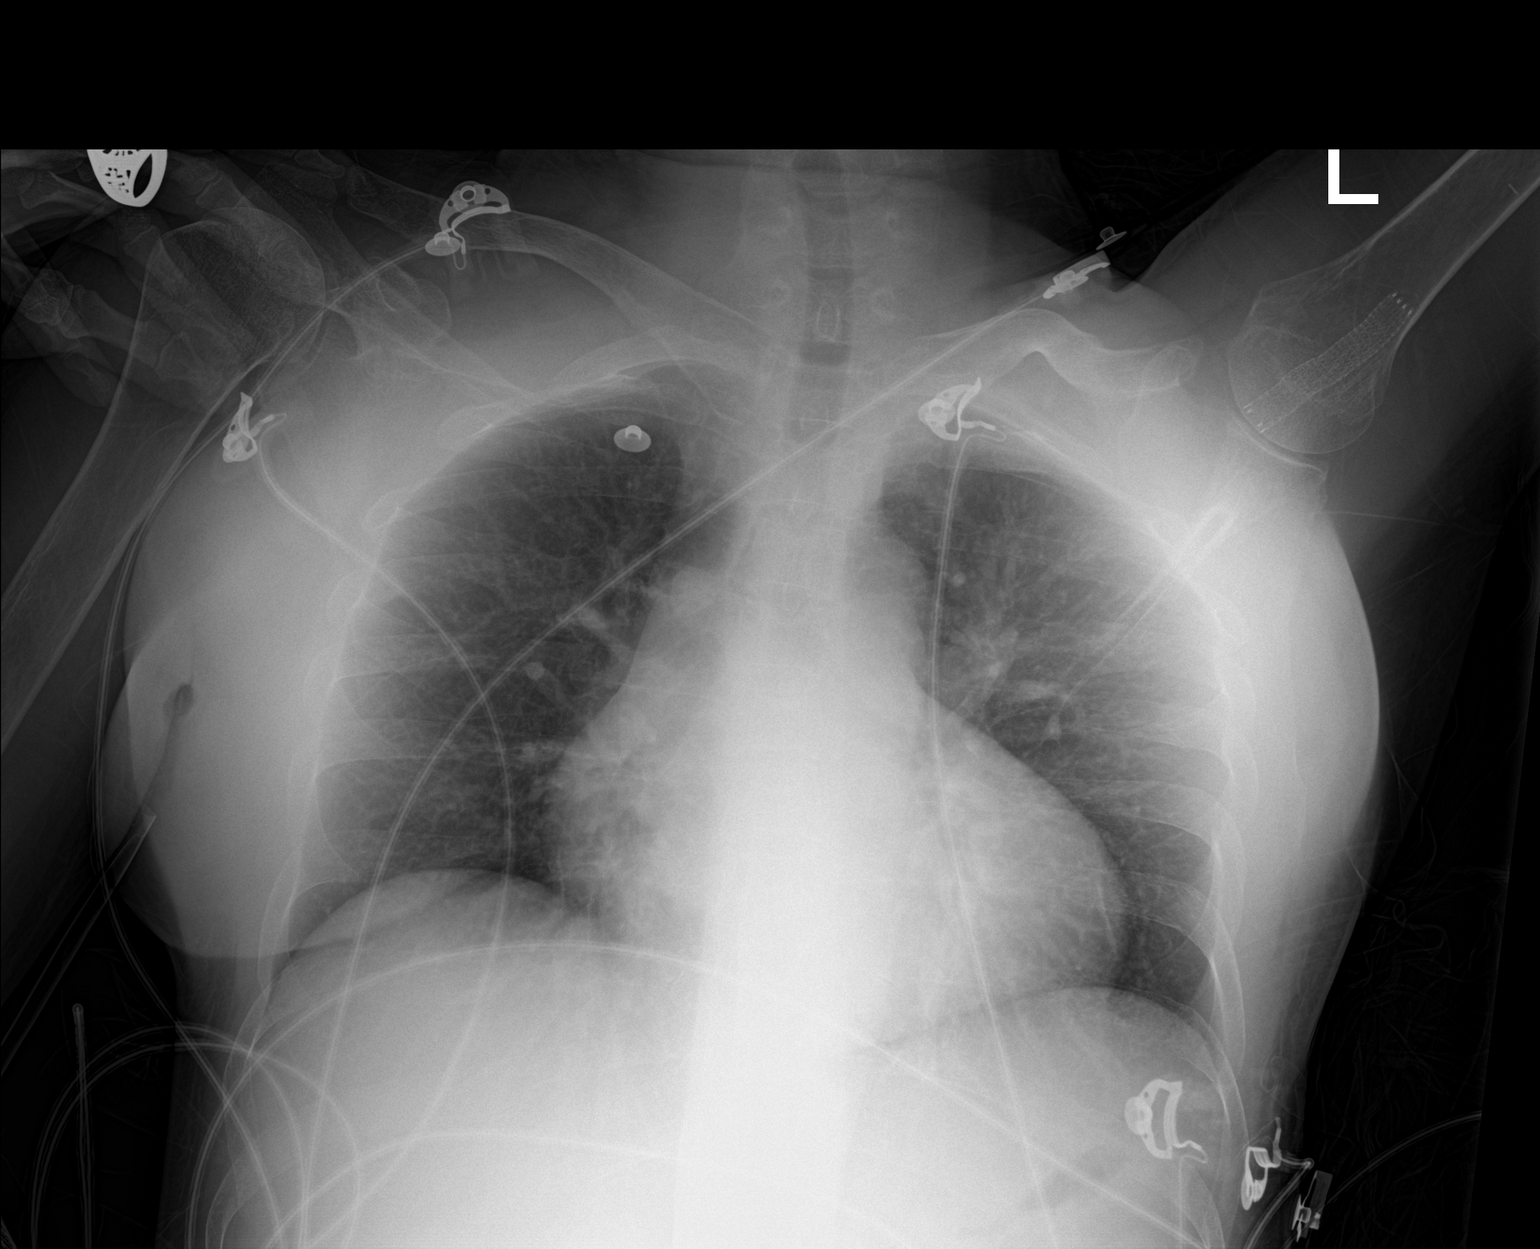

[1 of 1 positions shown; findings below may reference images not displayed]

FINDINGS: Lung volumes are normal. No consolidative airspace disease. No
pleural effusions. No pneumothorax. No definite suspicious appearing
pulmonary nodules or masses are noted. Cephalization of the
pulmonary vasculature, without frank pulmonary edema. Heart size is
mildly enlarged. Upper mediastinal contours are within normal
limits. Vascular stent projecting over the left axillary region.
IMPRESSION: 1. Cardiomegaly with pulmonary venous congestion, but no frank
pulmonary edema.

## 2022-04-18 IMAGING — CT CT HEAD W/O CM
3 series · 15 of 47 positions shown, 18 images · non-contrast
Comparison: None.

CLINICAL DATA: Altered mental status.

EXAM:
CT HEAD WITHOUT CONTRAST
TECHNIQUE: Contiguous axial images were obtained from the base of the skull
through the vertex without intravenous contrast.

[Series 3: head wo · axial · 0.49mm/px · z∈[+1252,+1402]mm · 9 of 36 slices shown, 12 images]
[im 3/36  brain]
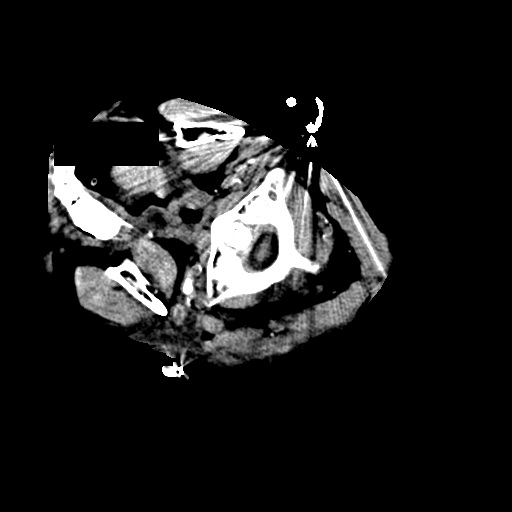
[im 3/36  bone]
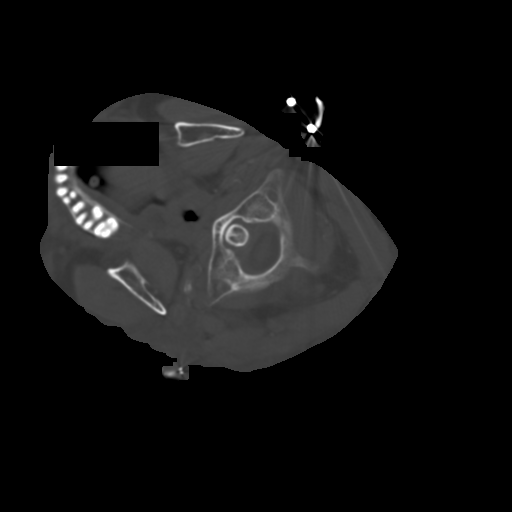
[im 7/36  brain]
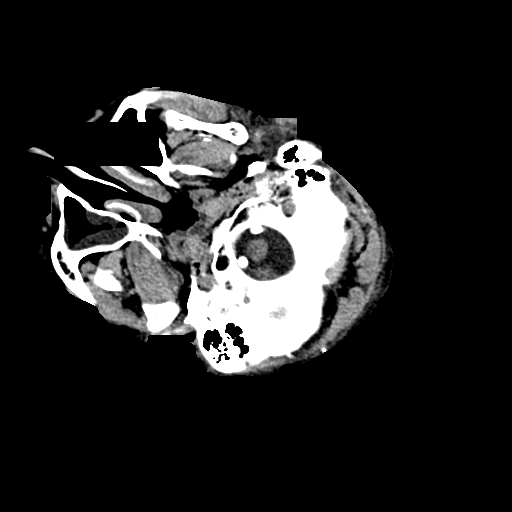
[im 10/36  brain]
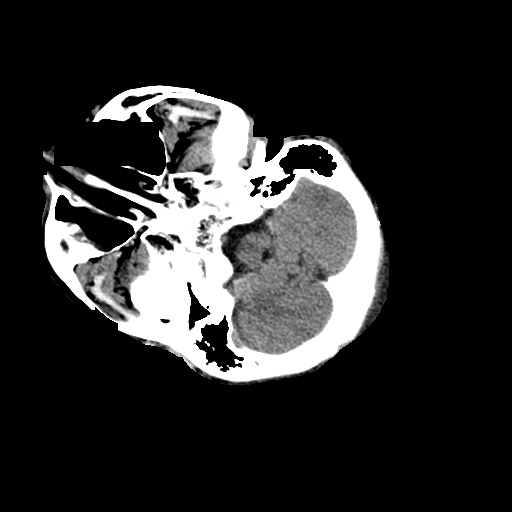
[im 14/36  brain]
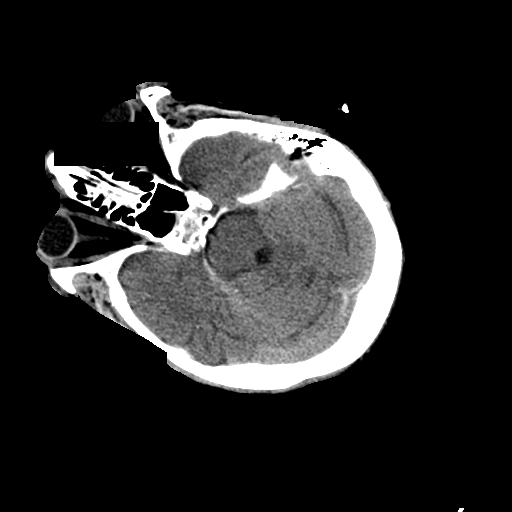
[im 19/36  brain]
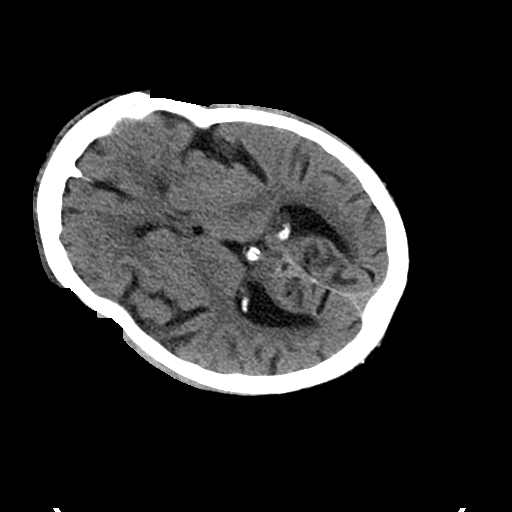
[im 19/36  bone]
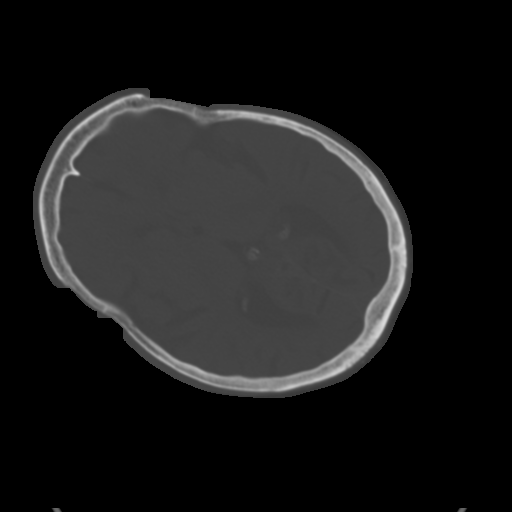
[im 22/36  brain]
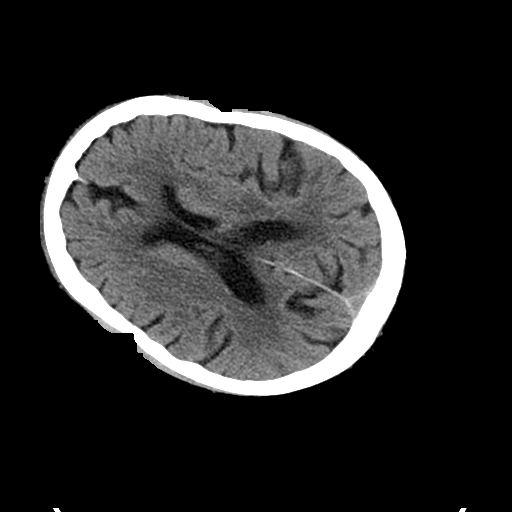
[im 26/36  brain]
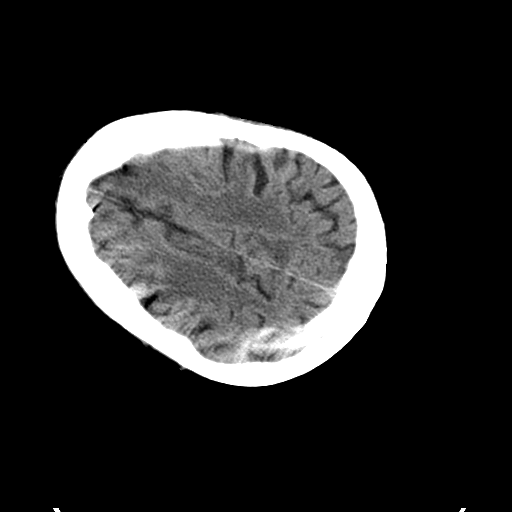
[im 29/36  brain]
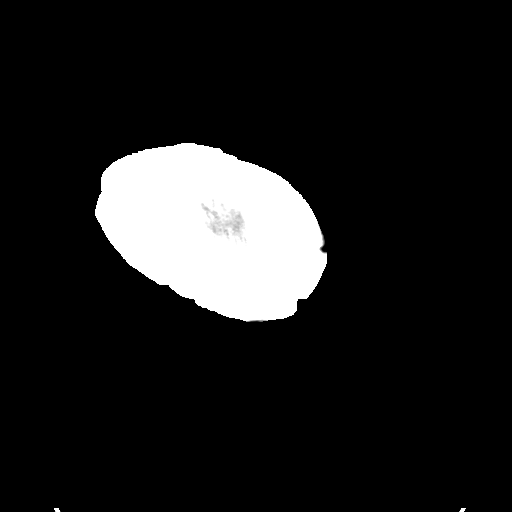
[im 33/36  brain]
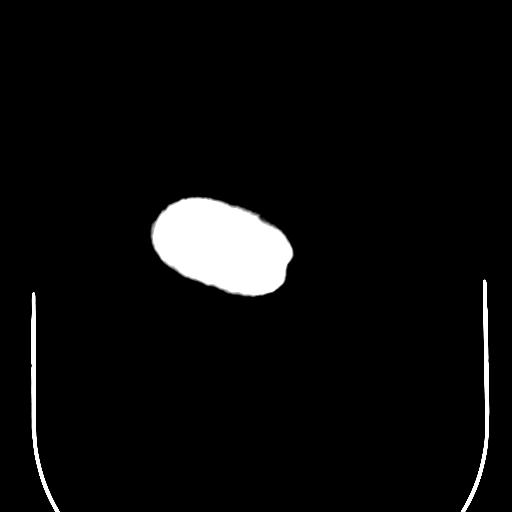
[im 33/36  bone]
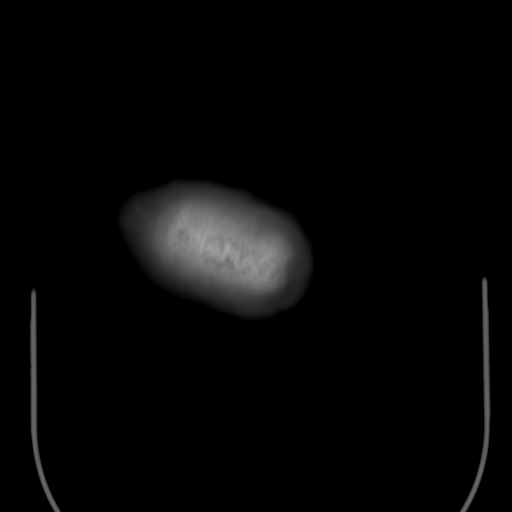

[Series 4: coronal soft tissue · coronal · 0.33mm/px · 3 of 73 slices shown]
[im 25/73  brain]
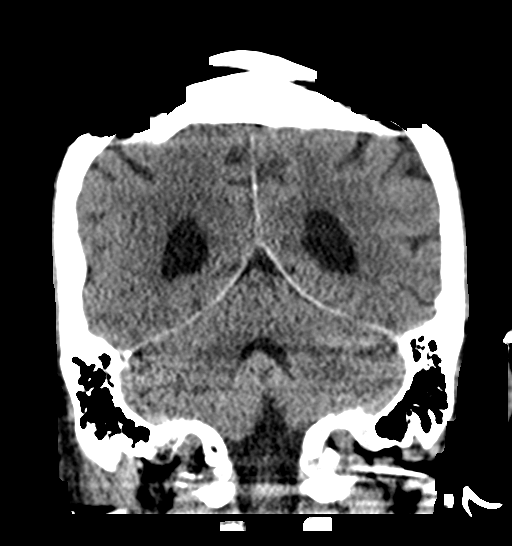
[im 33/73  brain]
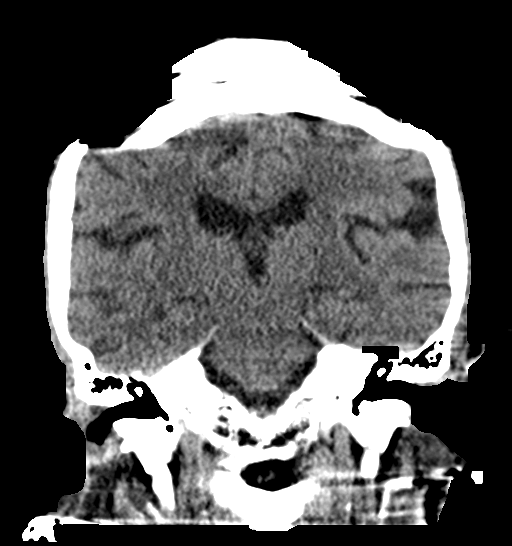
[im 41/73  brain]
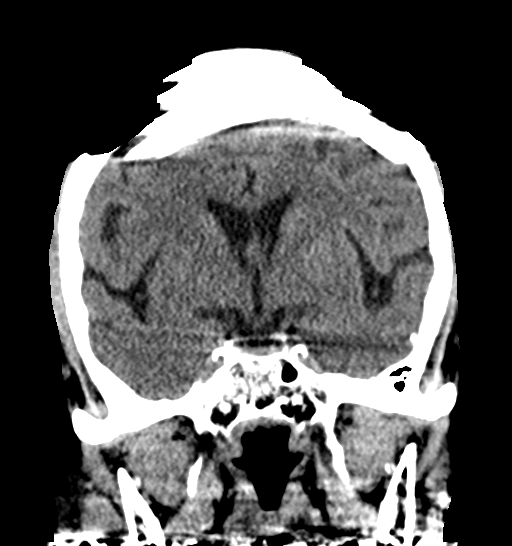

[Series 5: sagittal soft tissue · sagittal · 0.36mm/px · 3 of 55 slices shown]
[im 19/55  brain]
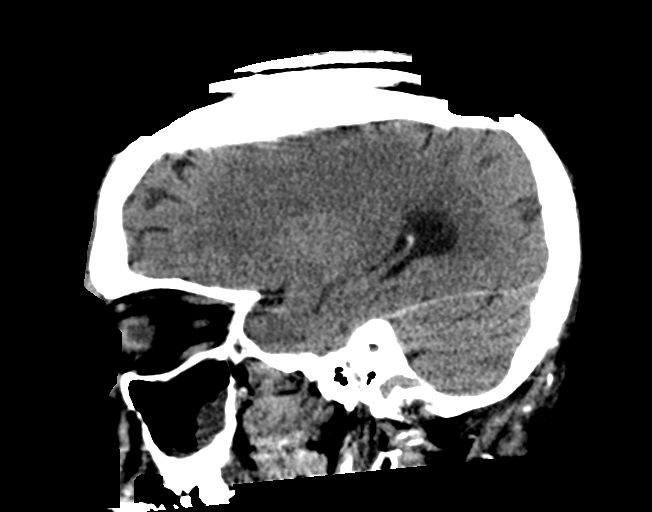
[im 28/55  brain]
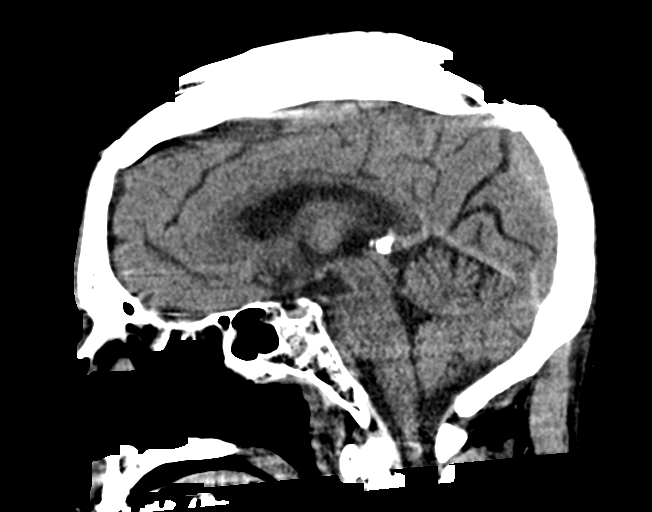
[im 37/55  brain]
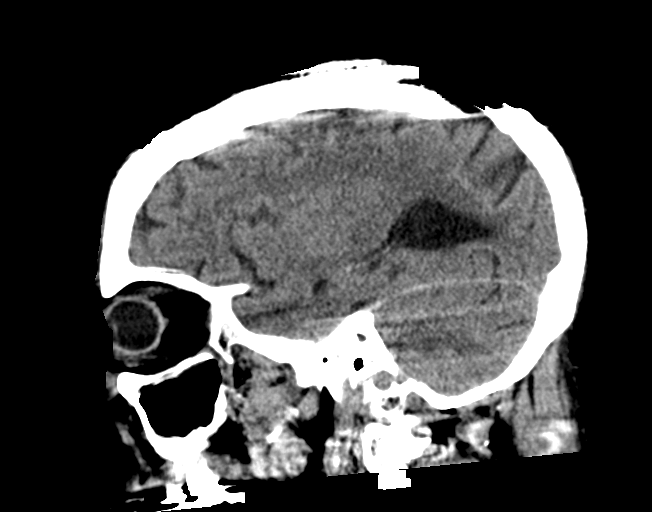

[15 of 47 positions shown; findings below may reference images not displayed]

FINDINGS: Motion artifact limits evaluation of the brain at the vertex.

Brain: No evidence of acute infarction, hemorrhage, hydrocephalus,
extra-axial collection or mass lesion/mass effect. Age advanced mild
generalized cerebral atrophy.

Vascular: Calcified atherosclerosis at the skullbase. No hyperdense
vessel.

Skull: Normal. Negative for fracture or focal lesion.

Sinuses/Orbits: Right greater than left maxillary sinus and ethmoid
air cell mucosal thickening. No air-fluid levels. The mastoid air
cells are clear. The orbits are unremarkable.

Other: None.
IMPRESSION: 1. Motion artifact limits evaluation of the brain at the vertex. No
definite acute intracranial abnormality.
2. Age advanced mild generalized cerebral atrophy.

## 2022-04-18 IMAGING — DX DG FOOT COMPLETE 3+V*L*
3 series · 3 of 3 positions shown · non-contrast
Comparison: 01/09/2019

CLINICAL DATA: Chronic infection.

EXAM:
LEFT FOOT - COMPLETE 3+ VIEW

[foot ap]
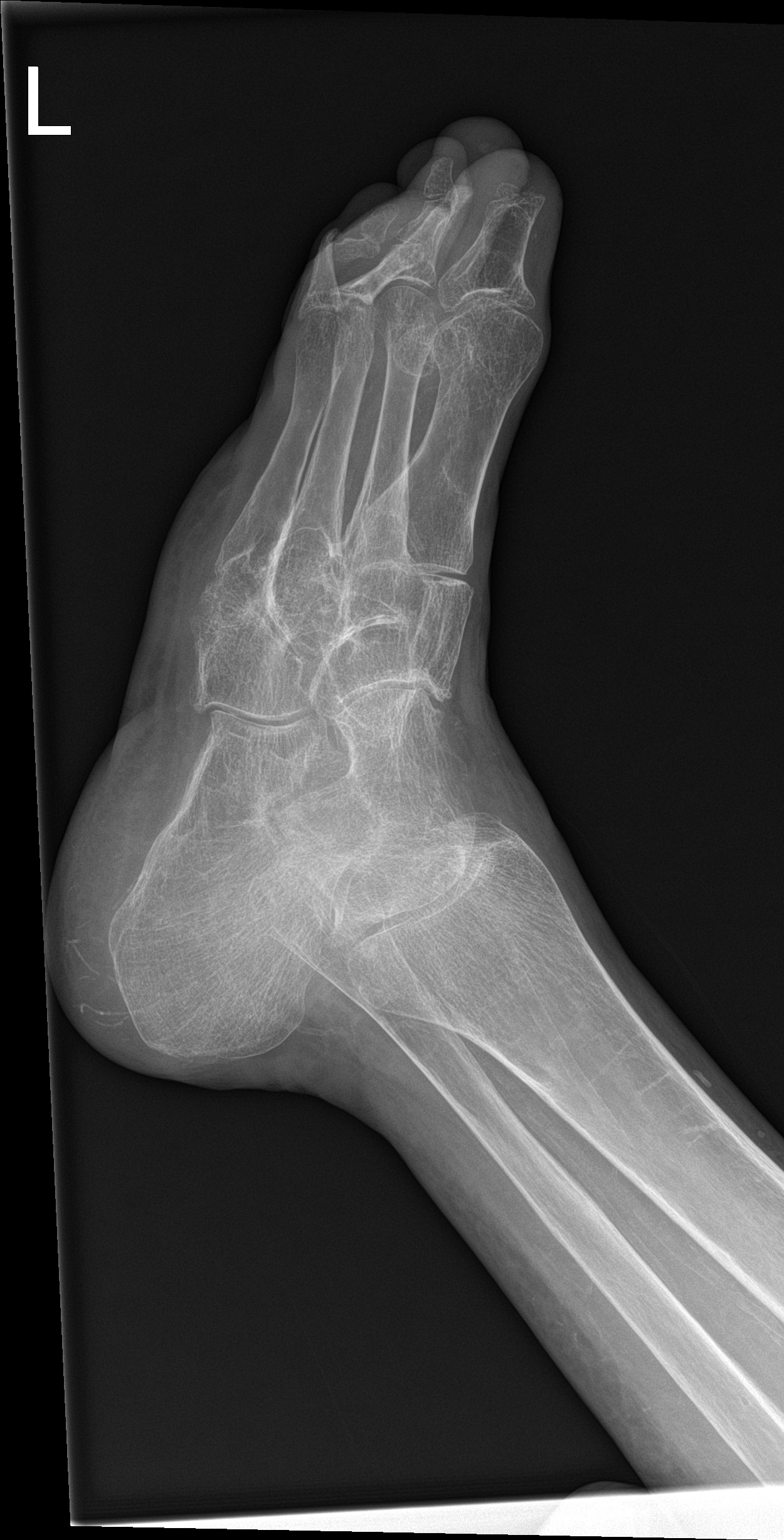

[foot obl]
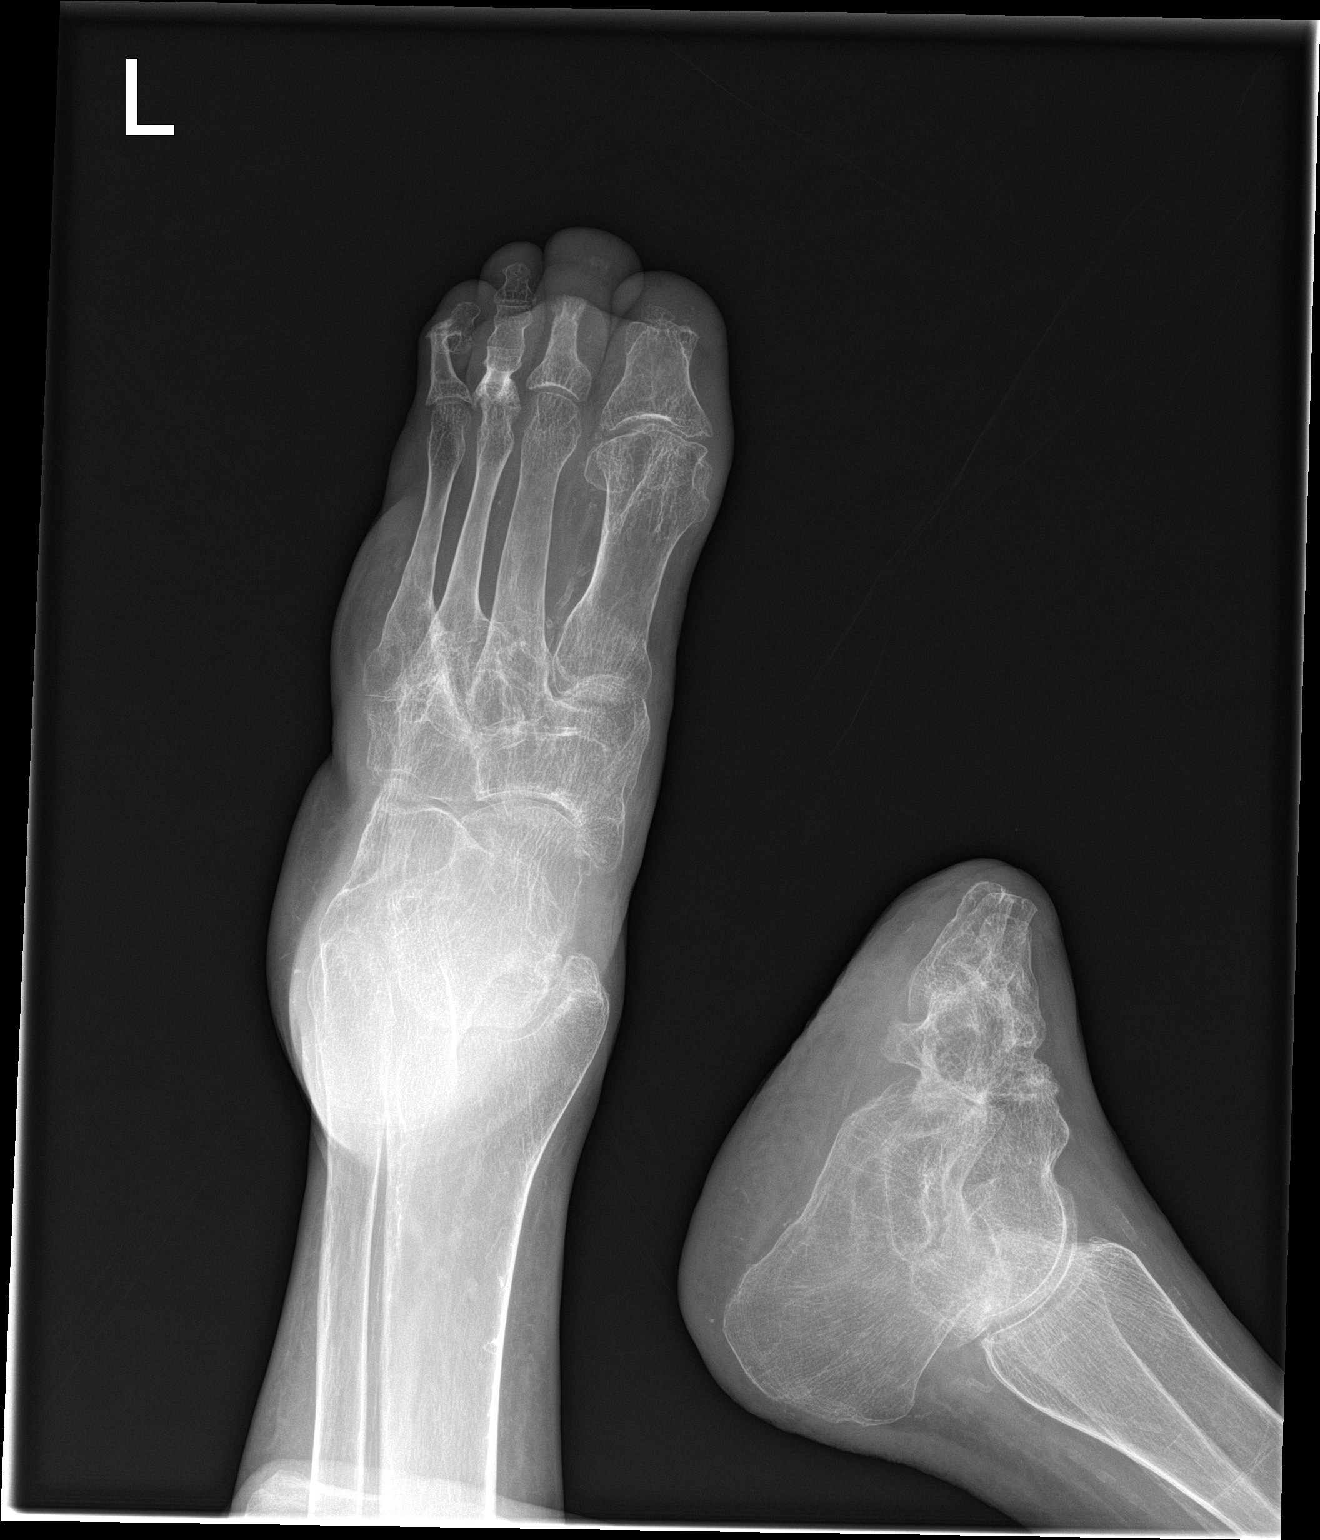

[foot lat]
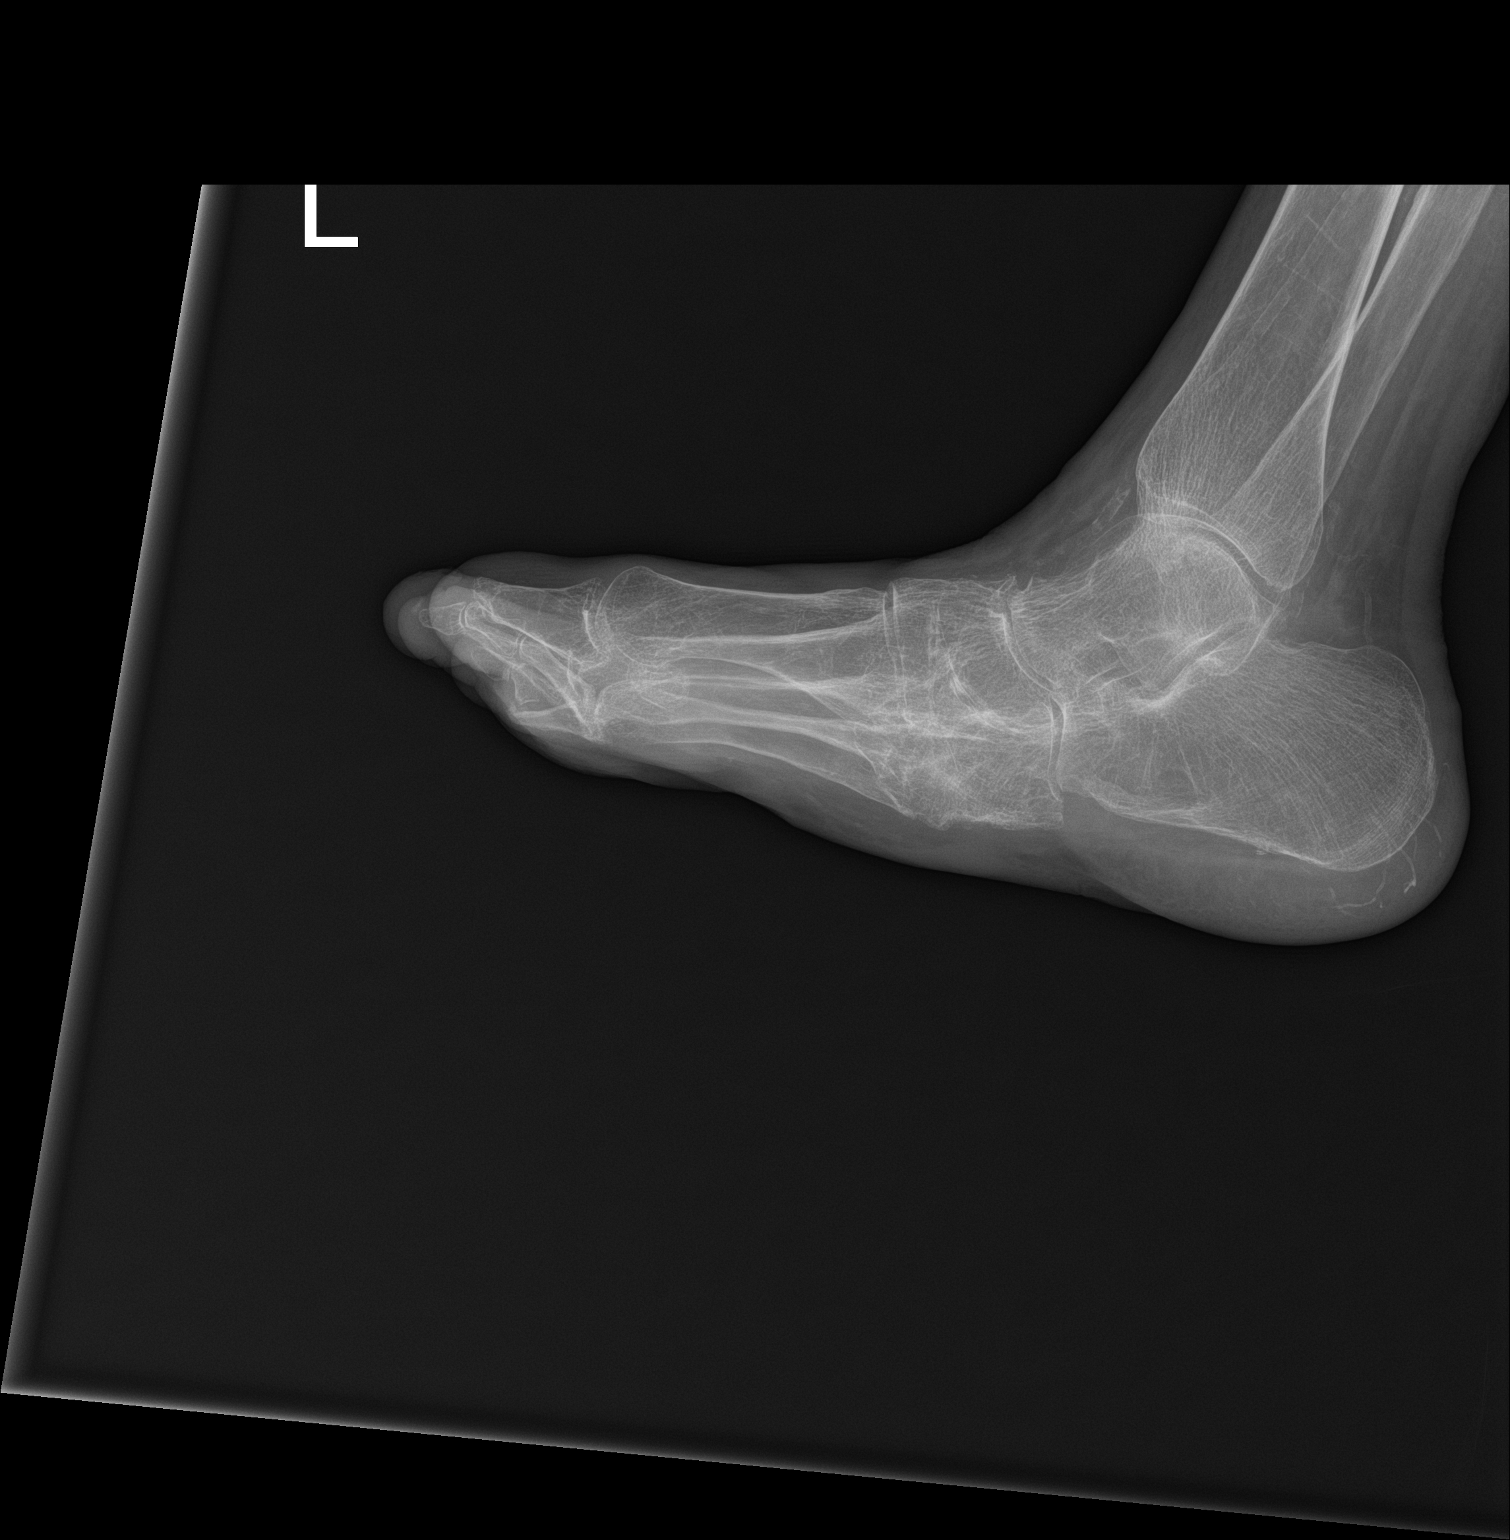

[3 of 3 positions shown; findings below may reference images not displayed]

FINDINGS: Generalized osteopenia. Status post fifth ray resection and partial
amputation of the great toe. There is soft tissue swelling with
extensive bony erosion involving the distal second digit. The head
of the proximal phalanx is eroded and the other phalanges are barely
visible. No opaque foreign body. Similar mildly swollen appearance
of the great toe stump.
IMPRESSION: Osteomyelitis findings at the second phalanges.

## 2022-04-19 IMAGING — DX DG FOOT COMPLETE 3+V*R*
3 series · 3 of 3 positions shown · non-contrast
Comparison: 07/04/2020

CLINICAL DATA: Wound, discharge

EXAM:
RIGHT FOOT COMPLETE - 3+ VIEW

[foot ap]
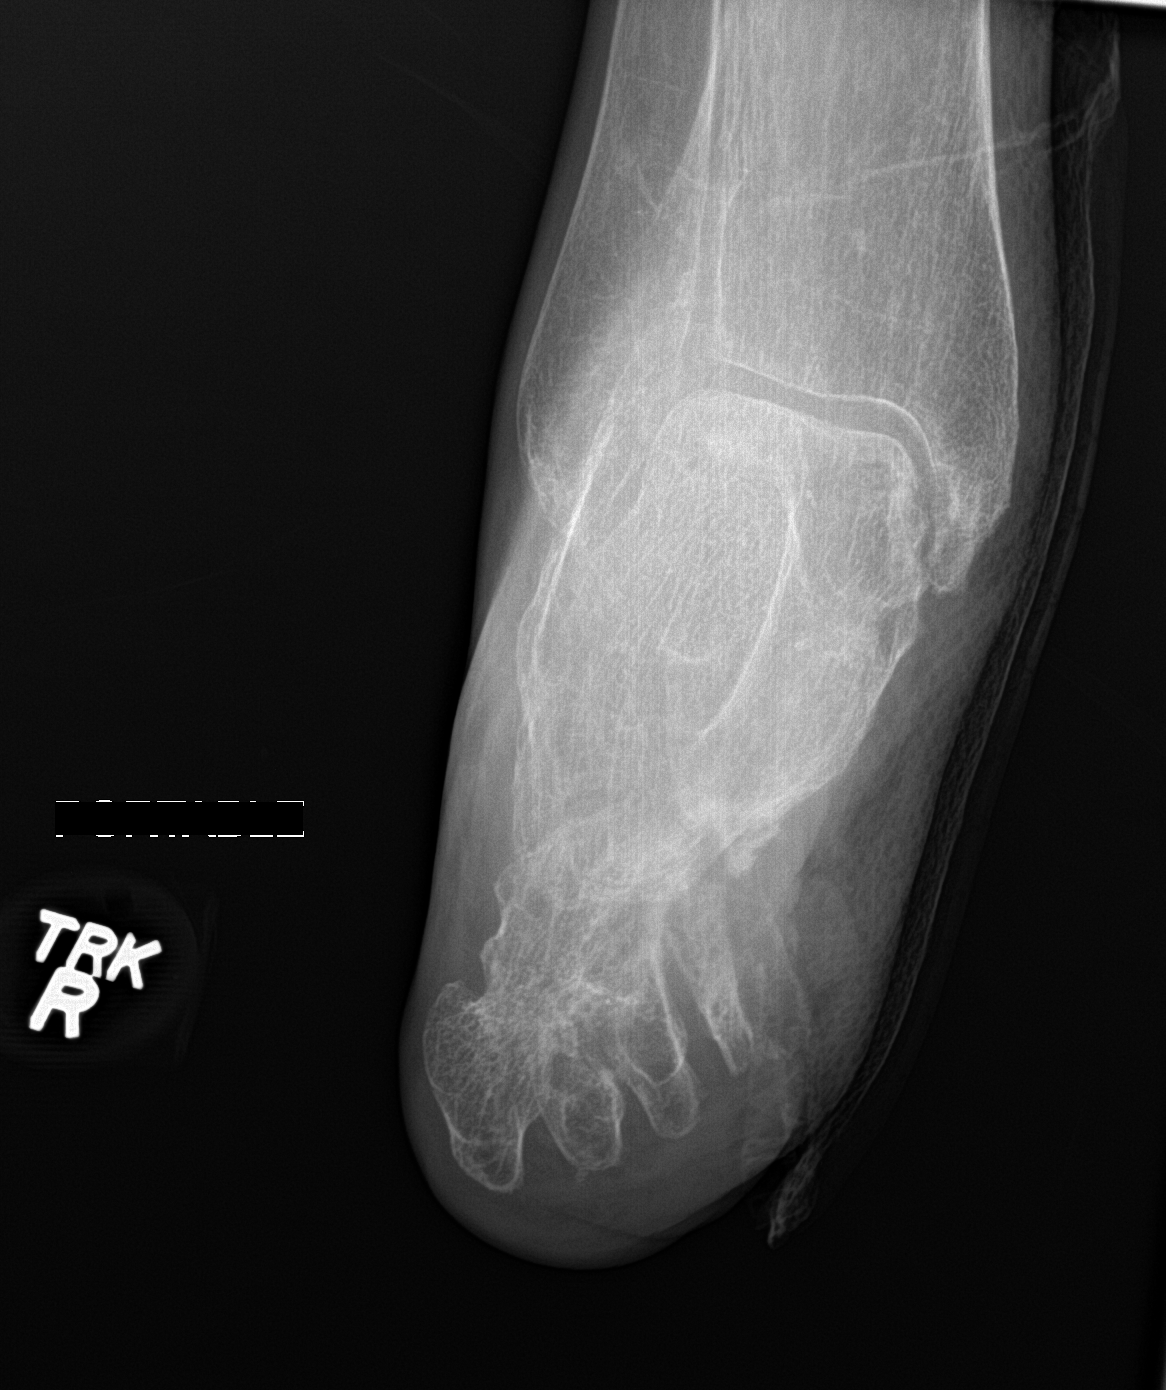

[foot obl]
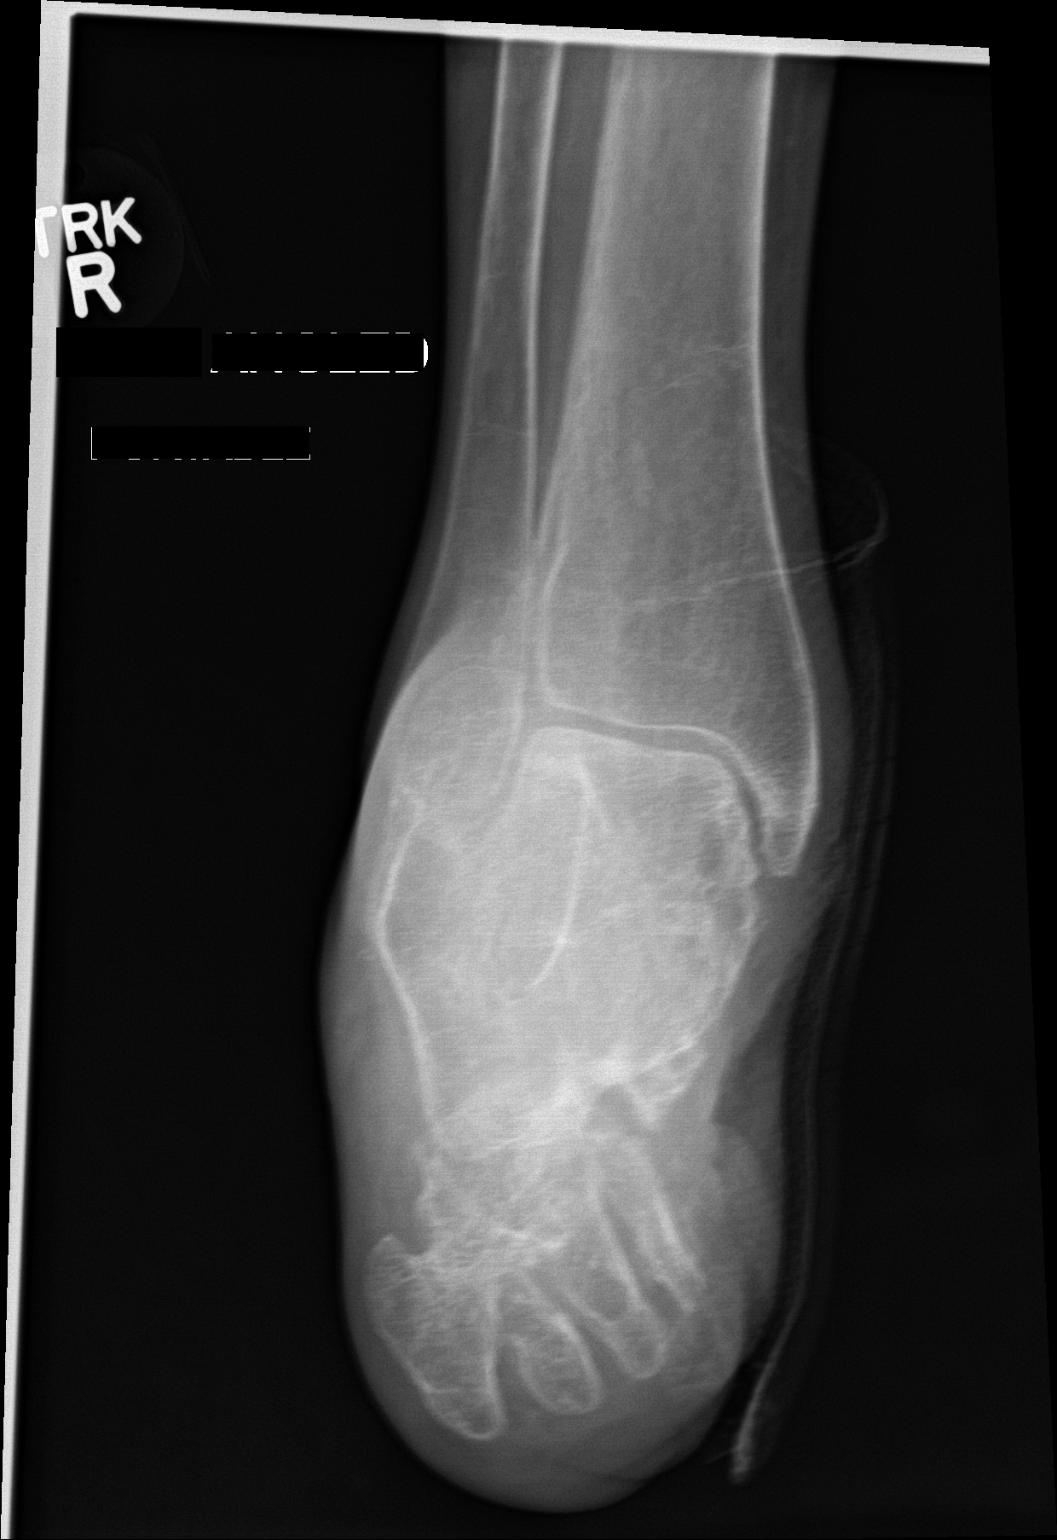

[foot lat]
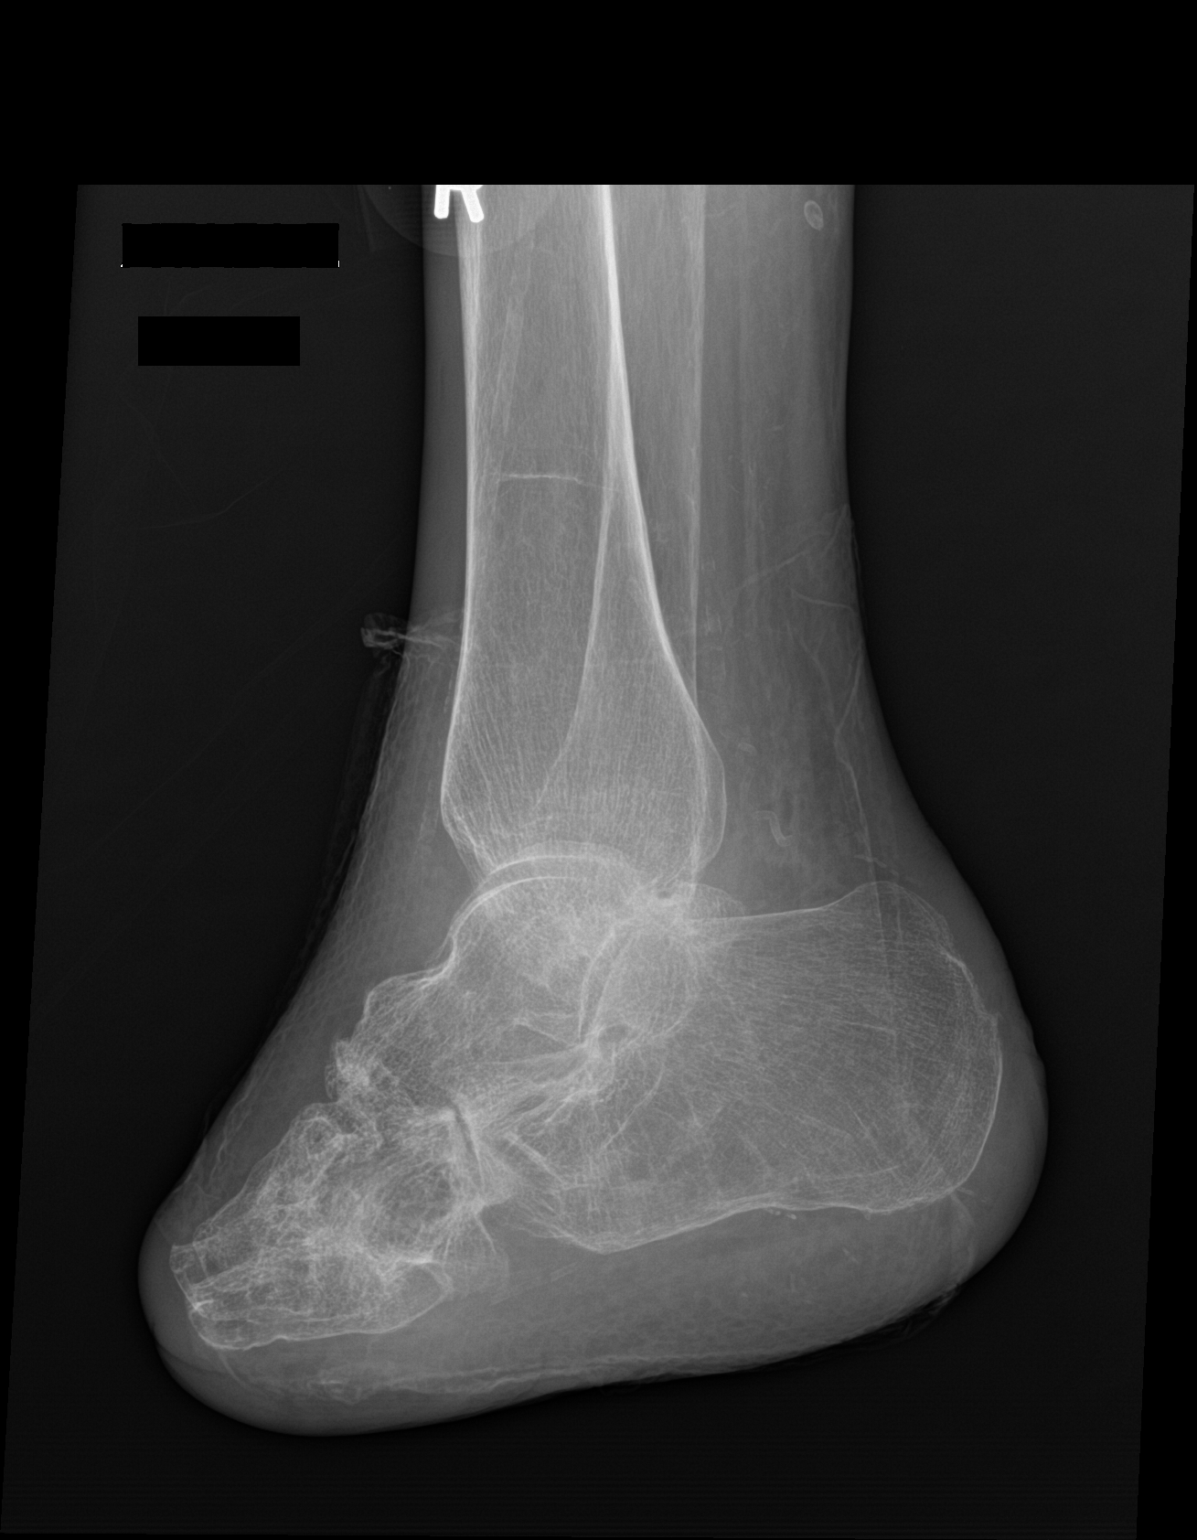

[3 of 3 positions shown; findings below may reference images not displayed]

FINDINGS: Frontal, oblique, lateral views of the right foot are obtained.
There has been a previous midfoot amputation at the level of the
metatarsals. Extensive degenerative changes are seen throughout the
hindfoot, unchanged since prior exam. There are no acute displaced
fractures.

The bones are diffusely osteopenic. No destructive changes or
periosteal reaction to suggest osteomyelitis. There is mild diffuse
soft tissue edema, most pronounced along the plantar medial aspect
at the amputation site. No evidence of subcutaneous gas or
radiopaque foreign body. Overlying bandage material is seen along
the medial right foot.
IMPRESSION: 1. Extensive postsurgical and degenerative changes as above, stable
since previous study.
2. No acute or destructive bony lesions.
3. Prominent soft tissue swelling along the plantar medial aspect of
the right foot.

## 2022-04-20 IMAGING — CT CT MAXILLOFACIAL W/ CM
3 of 6 series · 15 of 47 positions shown, 18 images · IV contrast (APPLIED)
Comparison: CT head 11/21/2020

CLINICAL DATA: Facial cellulitis

EXAM:
CT MAXILLOFACIAL WITH CONTRAST
TECHNIQUE: Multidetector CT imaging of the maxillofacial structures was
performed with intravenous contrast. Multiplanar CT image
reconstructions were also generated.
CONTRAST:  75mL OMNIPAQUE IOHEXOL 300 MG/ML  SOLN

[Series 3: maxilllofacial 2.0 hr40 3 · axial · 0.37mm/px · z∈[-190,-54]mm · 9 of 80 slices shown, 12 images]
[im 6/80  brain]
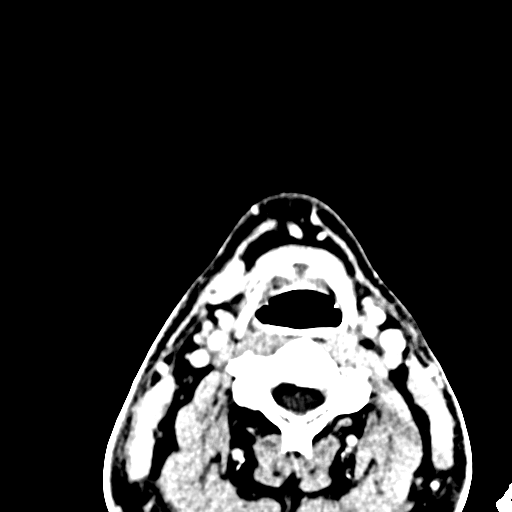
[im 6/80  bone]
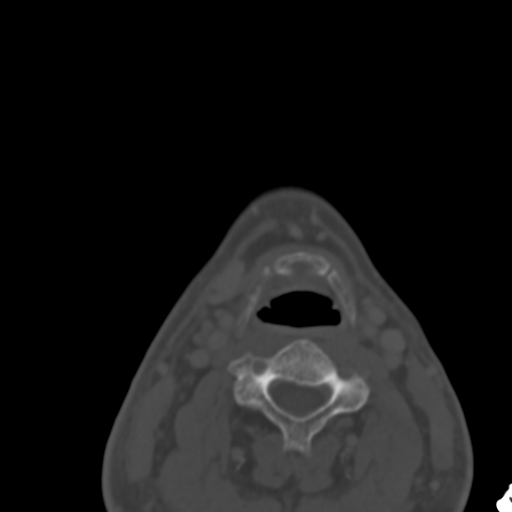
[im 17/80  bone]
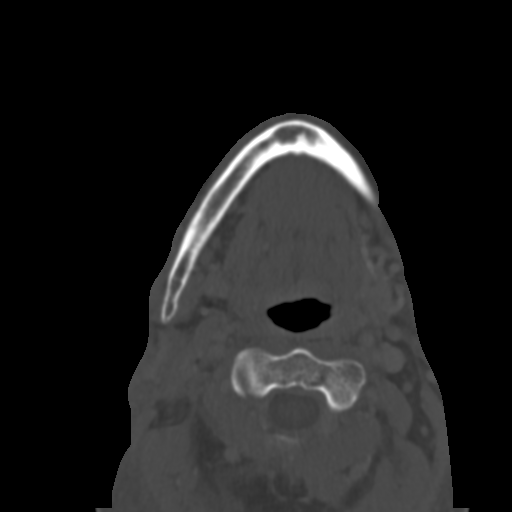
[im 23/80  bone]
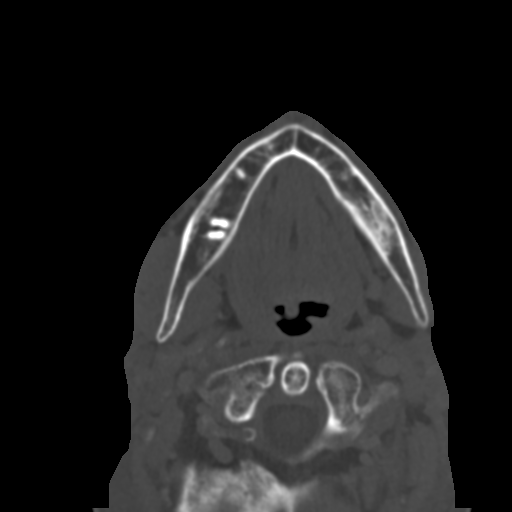
[im 34/80  bone]
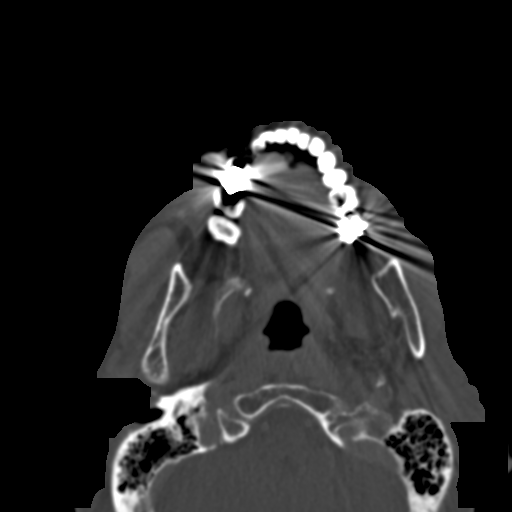
[im 40/80  brain]
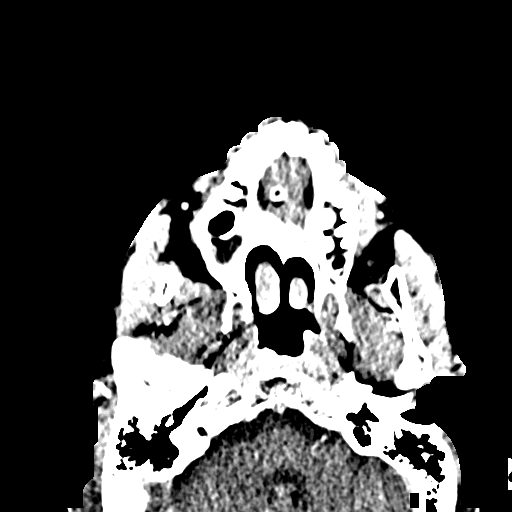
[im 40/80  bone]
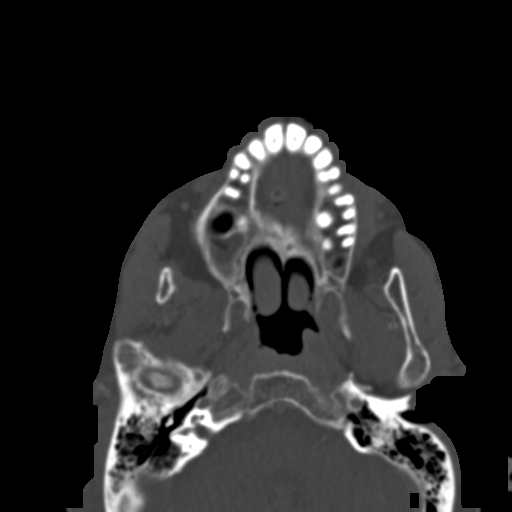
[im 46/80  bone]
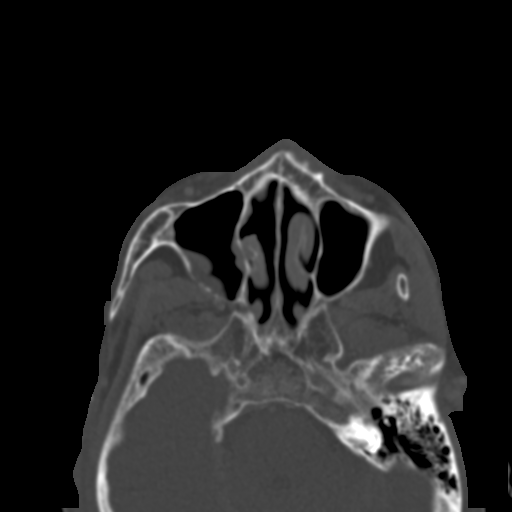
[im 57/80  bone]
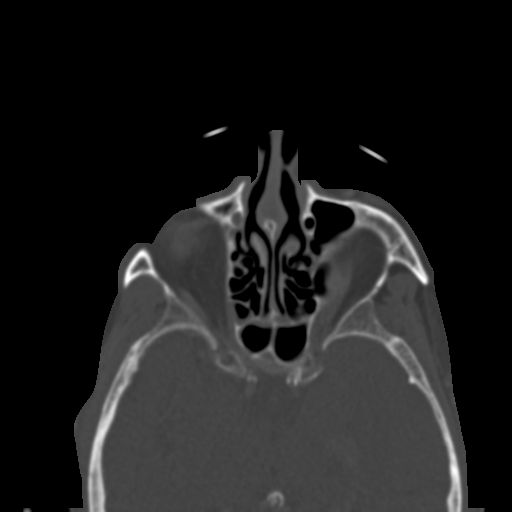
[im 63/80  bone]
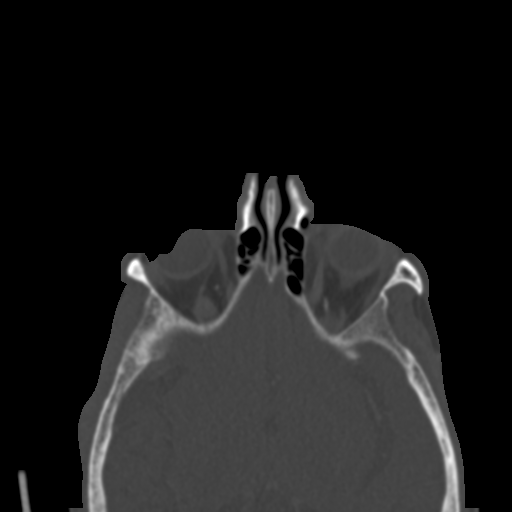
[im 74/80  brain]
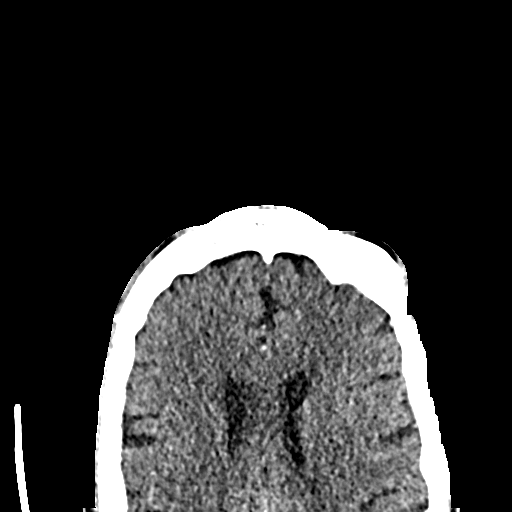
[im 74/80  bone]
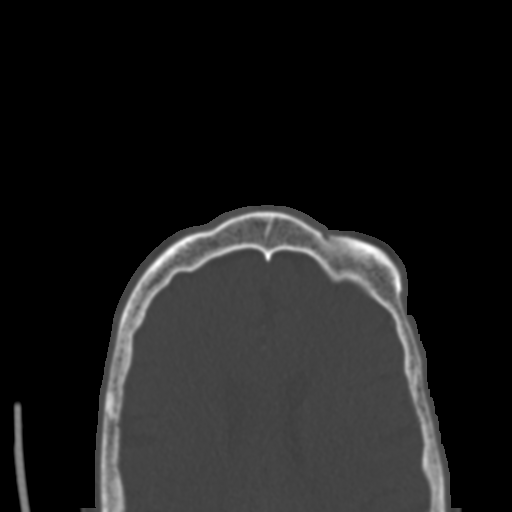

[Series 8: st sag · sagittal · 0.32mm/px · 3 of 90 slices shown]
[im 17/90  bone]
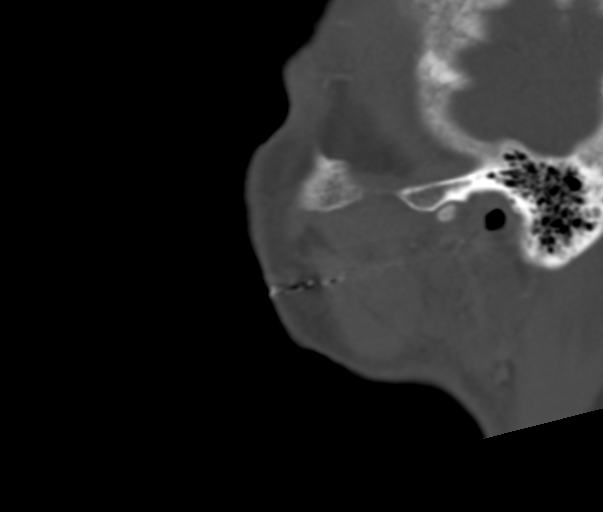
[im 34/90  bone]
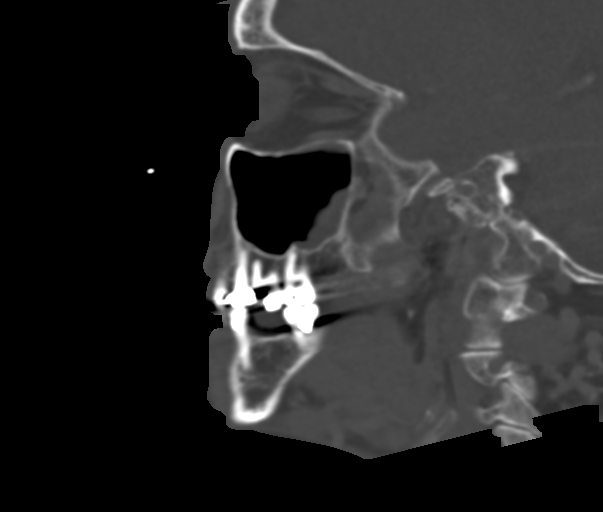
[im 51/90  bone]
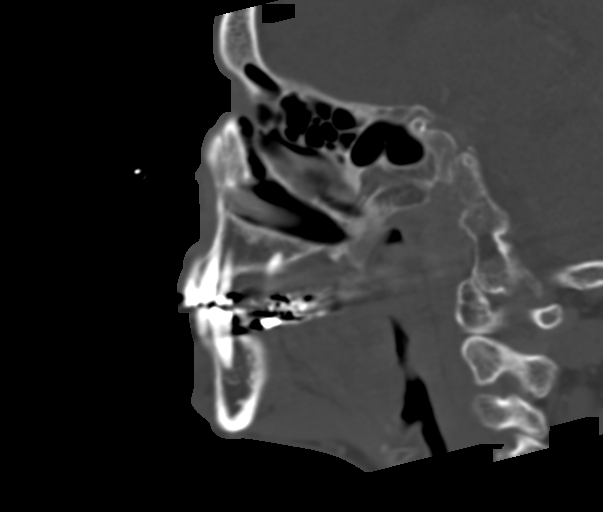

[Series 9: bone cor · coronal · 0.32mm/px · 3 of 112 slices shown]
[im 23/112  bone]
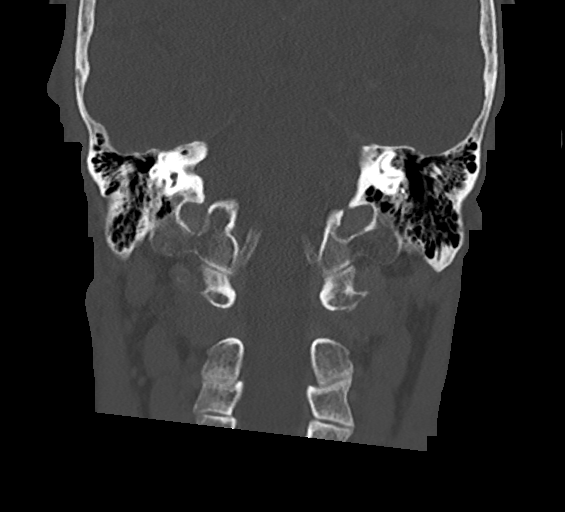
[im 45/112  bone]
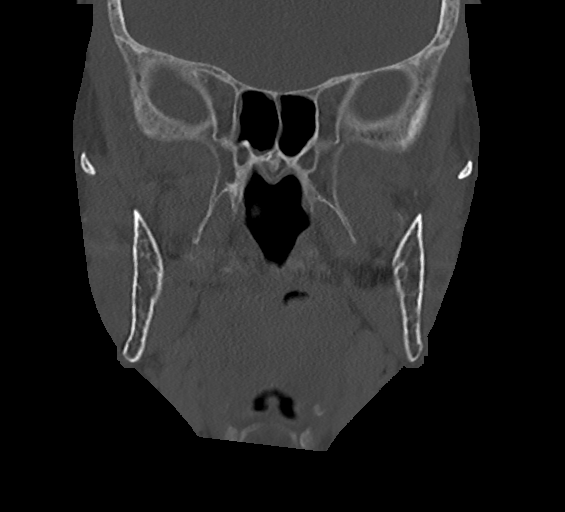
[im 67/112  bone]
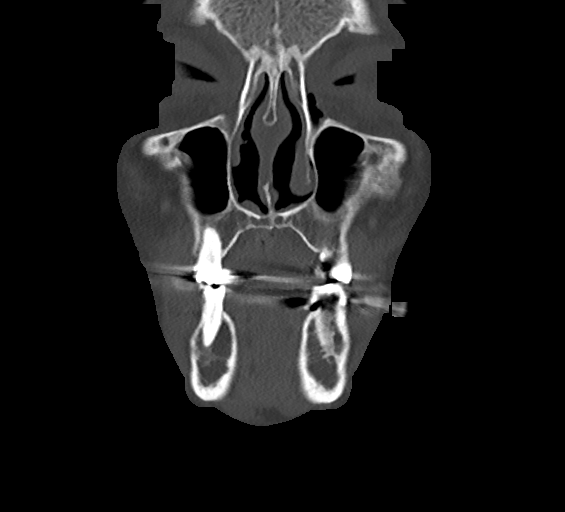

[15 of 47 positions shown; findings below may reference images not displayed]

FINDINGS: Osseous: Periapical lucency around left lower molar compatible with
infection which may be chronic. No fracture.

Orbits: Bilateral cataract extraction.  No orbital mass or edema.

Sinuses: Mucosal edema right maxillary sinus. Remaining sinuses
clear. Mastoid clear bilaterally.

Soft tissues: Mild soft tissue edema in the subcutaneous tissue of
the anterior neck and lateral to the mandible bilaterally. No soft
tissue abscess identified.

Limited intracranial: No acute abnormality
IMPRESSION: Mild soft tissue swelling in the anterior neck bilaterally,
nonspecific in appearance. Possible edema or cellulitis. No soft
tissue abscess.

Periapical lucency around left lower molar consistent with dental
infection

## 2022-04-21 IMAGING — MR MR HEAD W/O CM
5 of 6 series · 30 of 48 positions shown · non-contrast
Comparison: CT head 11/21/2020

CLINICAL DATA: Altered mental status, metabolic encephalopathy

EXAM:
MRI HEAD WITHOUT CONTRAST
TECHNIQUE: Multiplanar, multiecho pulse sequences of the brain and surrounding
structures were obtained without intravenous contrast.

[Series 3: DWI · axial · 3.0mm · 1.09mm/px · z∈[-13,+118]mm · 9 of 90 slices shown (1 of 2)]
[im 1/90]
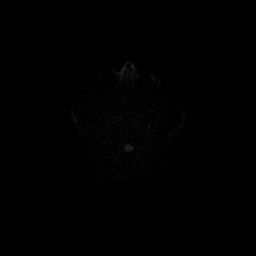
[im 13/90]
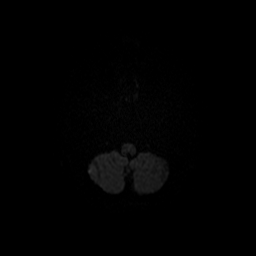
[im 26/90]
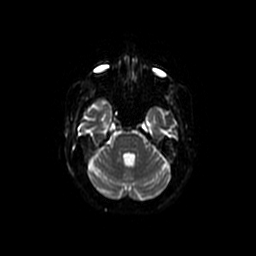
[im 39/90]
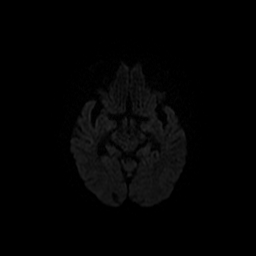
[im 45/90]
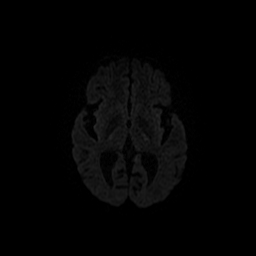
[im 51/90]
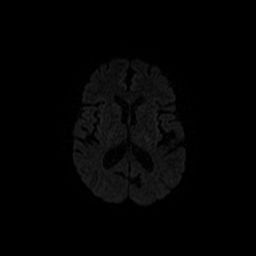
[im 64/90]
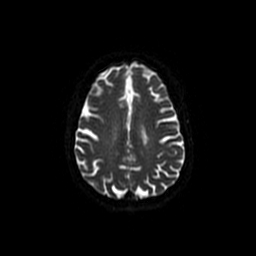
[im 77/90]
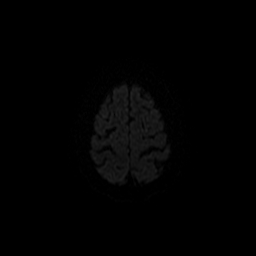
[im 90/90]
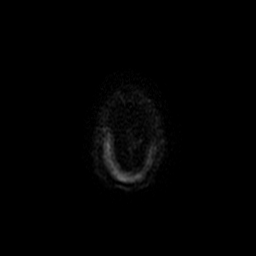

[Series 7: FLAIR · axial · 3.0mm · 0.43mm/px · z∈[-24,+114]mm · 4 of 24 slices shown (1 of 2)]
[im 1/24]
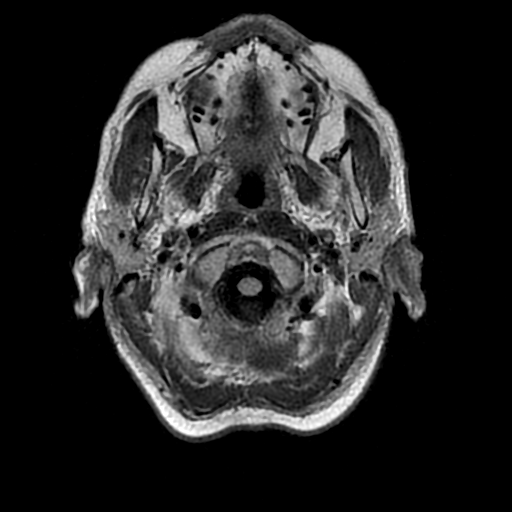
[im 8/24]
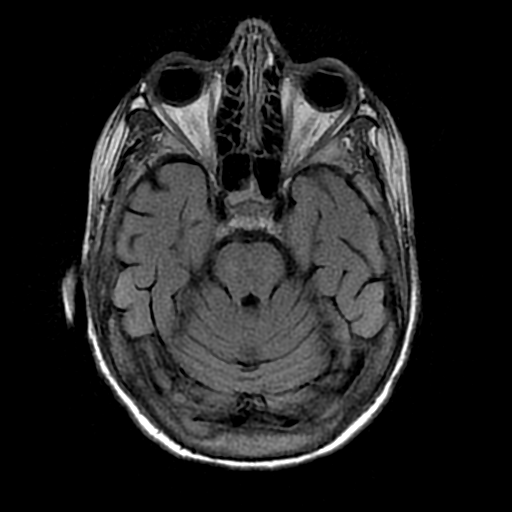
[im 16/24]
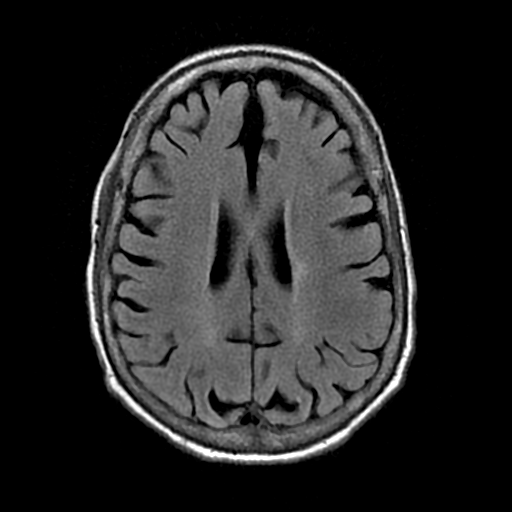
[im 24/24]
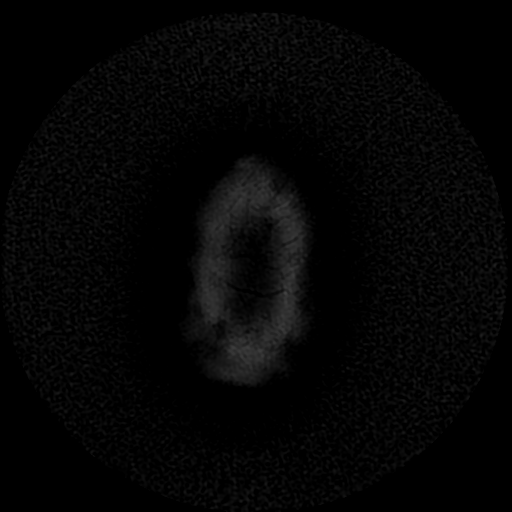

[Series 9: T1 · axial · 3.0mm · 0.47mm/px · z∈[-18,+82]mm · 6 of 96 slices shown]
[im 1/96]
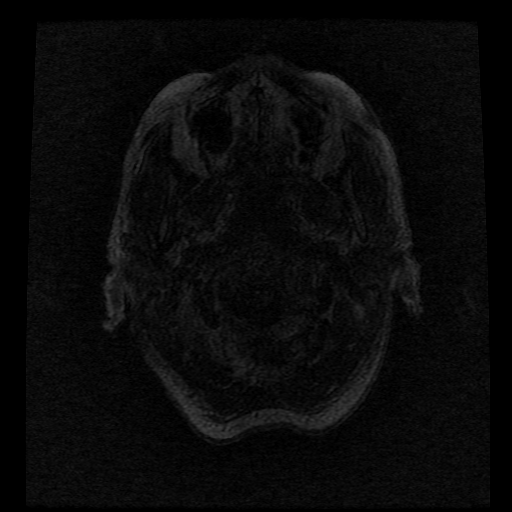
[im 14/96]
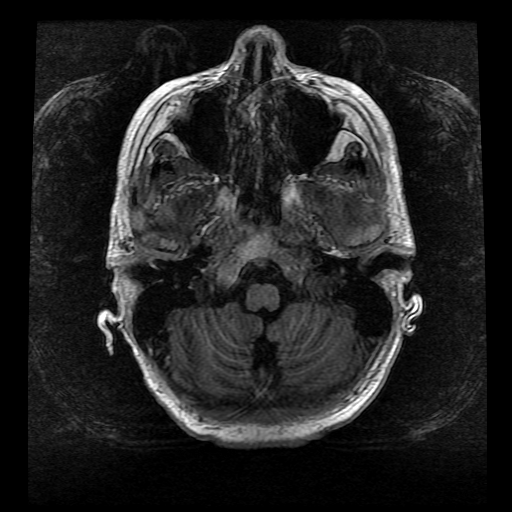
[im 28/96]
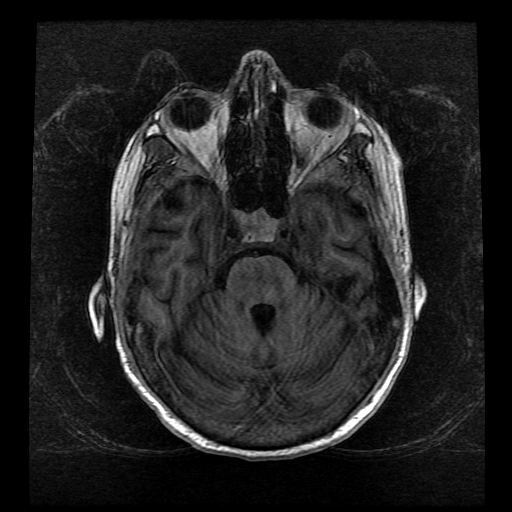
[im 41/96]
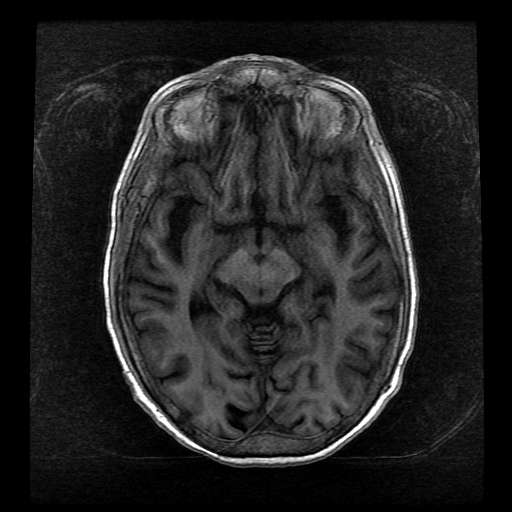
[im 55/96]
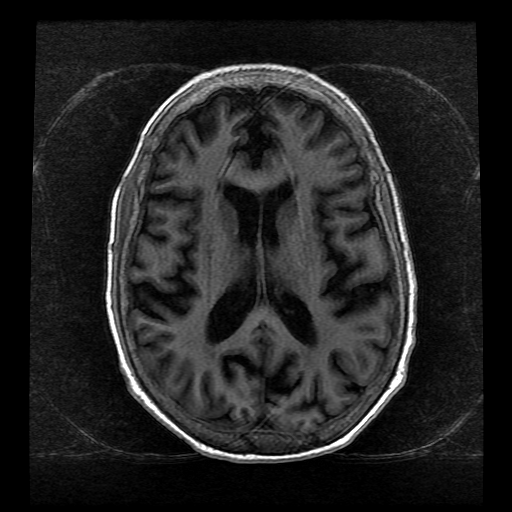
[im 68/96]
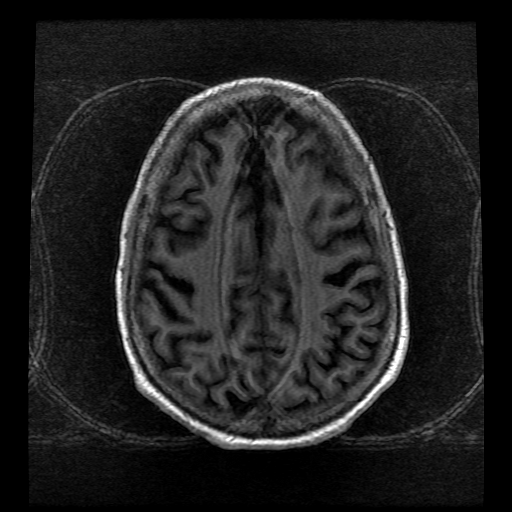

[Series 11: FLAIR · coronal · 3.0mm · 0.35mm/px · 4 of 23 slices shown (2 of 2)]
[im 1/23]
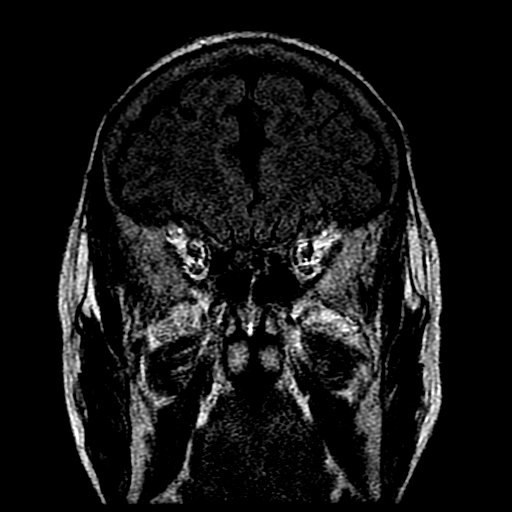
[im 8/23]
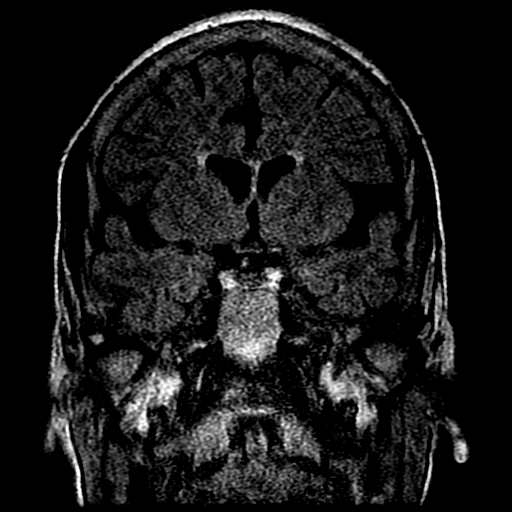
[im 15/23]
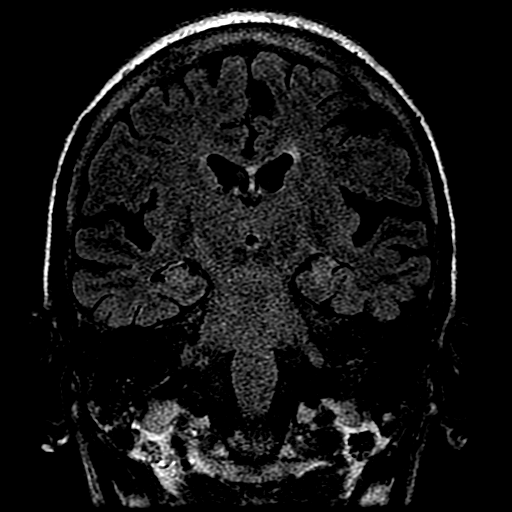
[im 23/23]
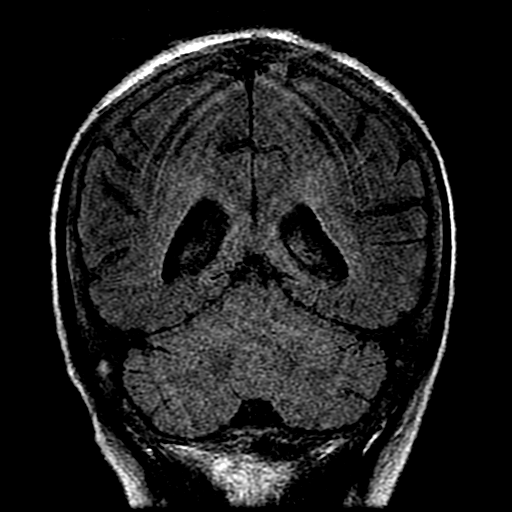

[Series 300: DWI · axial · 3.0mm · 1.09mm/px · z∈[-13,+118]mm · 7 of 45 slices shown (2 of 2)]
[im 1/45]
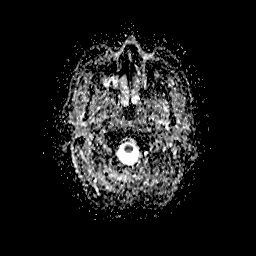
[im 8/45]
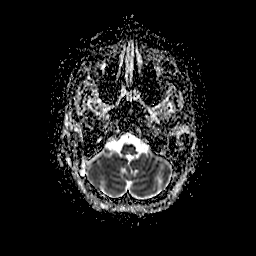
[im 15/45]
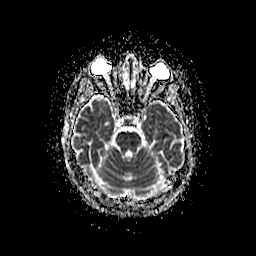
[im 23/45]
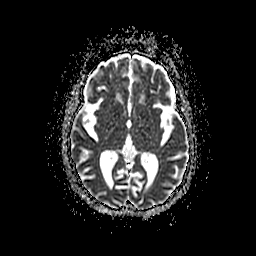
[im 30/45]
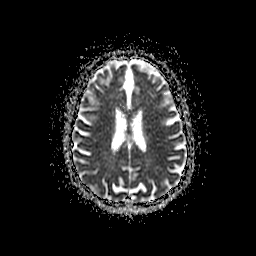
[im 37/45]
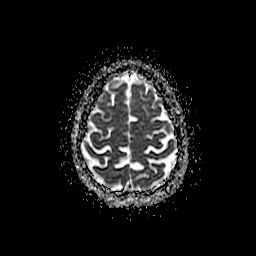
[im 45/45]
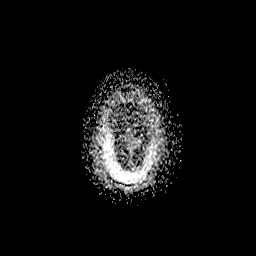

[30 of 48 positions shown; findings below may reference images not displayed]

FINDINGS: Brain: There is no evidence of acute intracranial hemorrhage,
extra-axial fluid collection, or infarct. There is no focal
parenchymal signal abnormality. There is mild global parenchymal
volume loss. The ventricles are enlarged. No mass lesion is
identified. There is no midline shift.

Vascular: Normal flow voids.

Skull and upper cervical spine: Normal marrow signal.

Sinuses/Orbits: The paranasal sinuses are clear. Bilateral lens
implants are in place. The bones and orbits are otherwise
unremarkable.

Other: There are bilateral mastoid effusions.
IMPRESSION: 1. No acute intracranial pathology.
2. Bilateral mastoid effusions.

## 2022-04-21 IMAGING — DX DG ABDOMEN 1V
1 series · 2 of 2 positions shown · non-contrast
Comparison: None.

CLINICAL DATA: Evaluate for metallic foreign body

EXAM:
ABDOMEN - 1 VIEW

[Series 1: abdomen · 0.14mm/px · 2 of 2 slices shown]
[im 1/2]
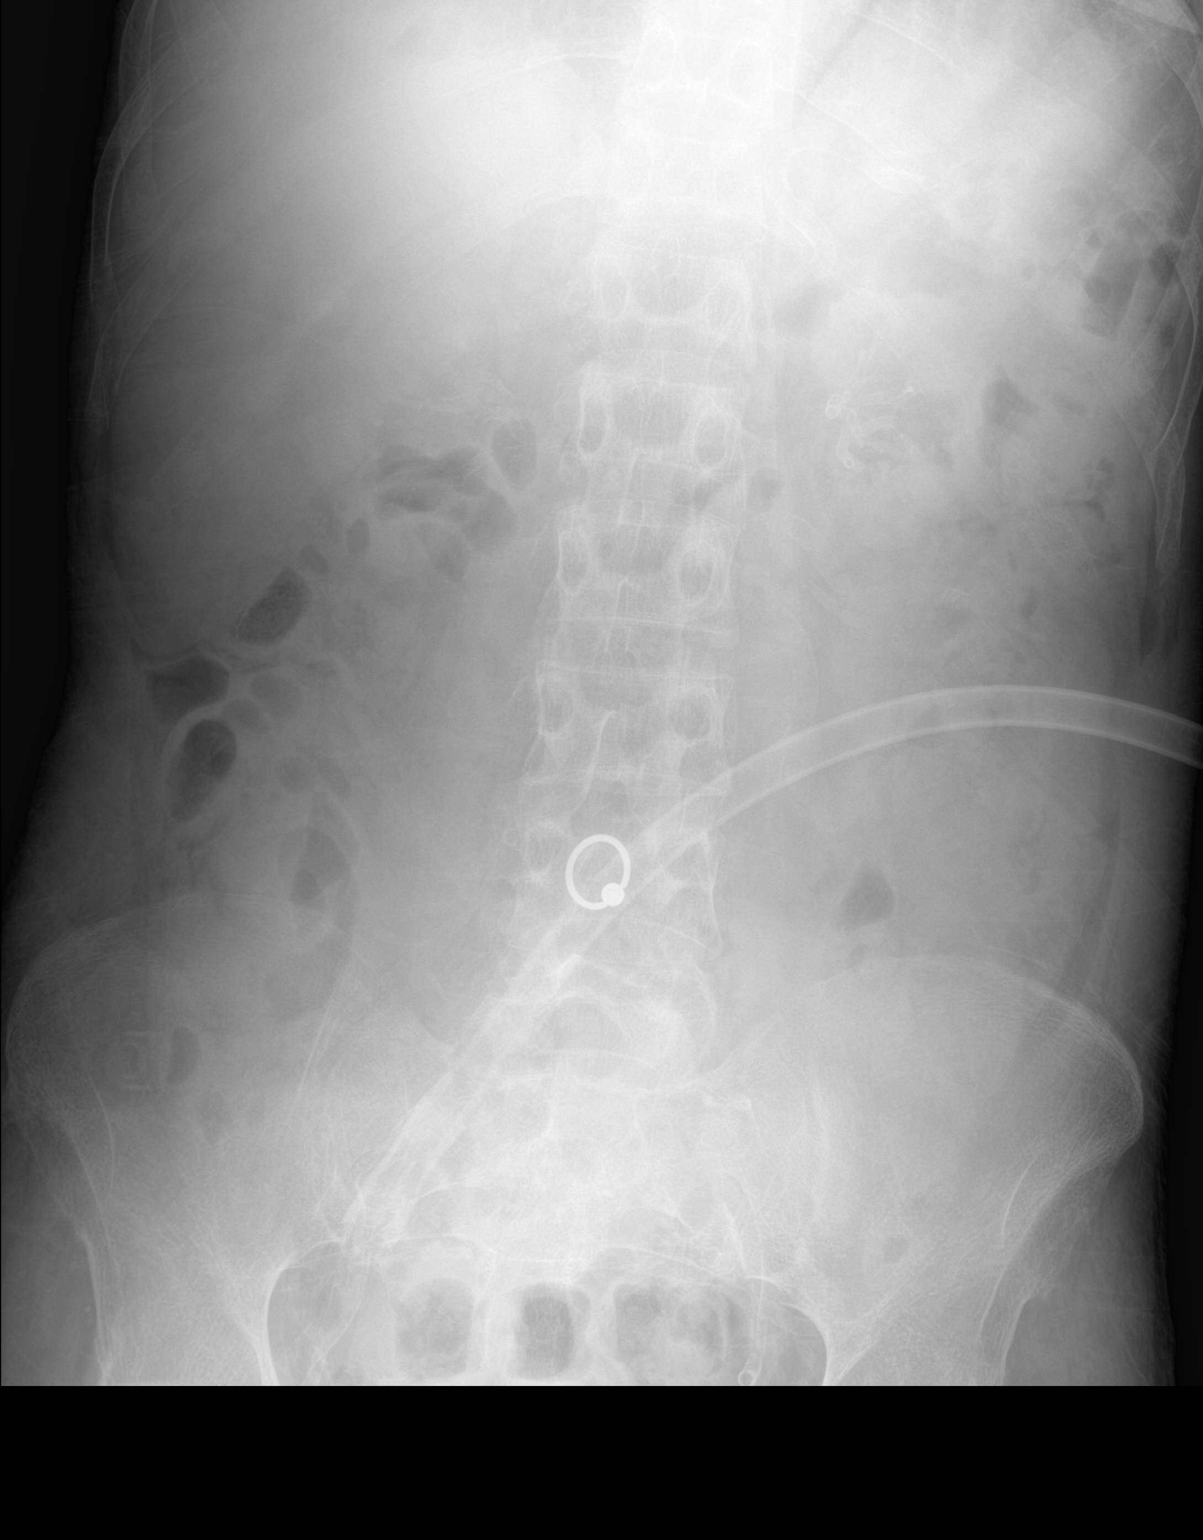
[im 2/2]
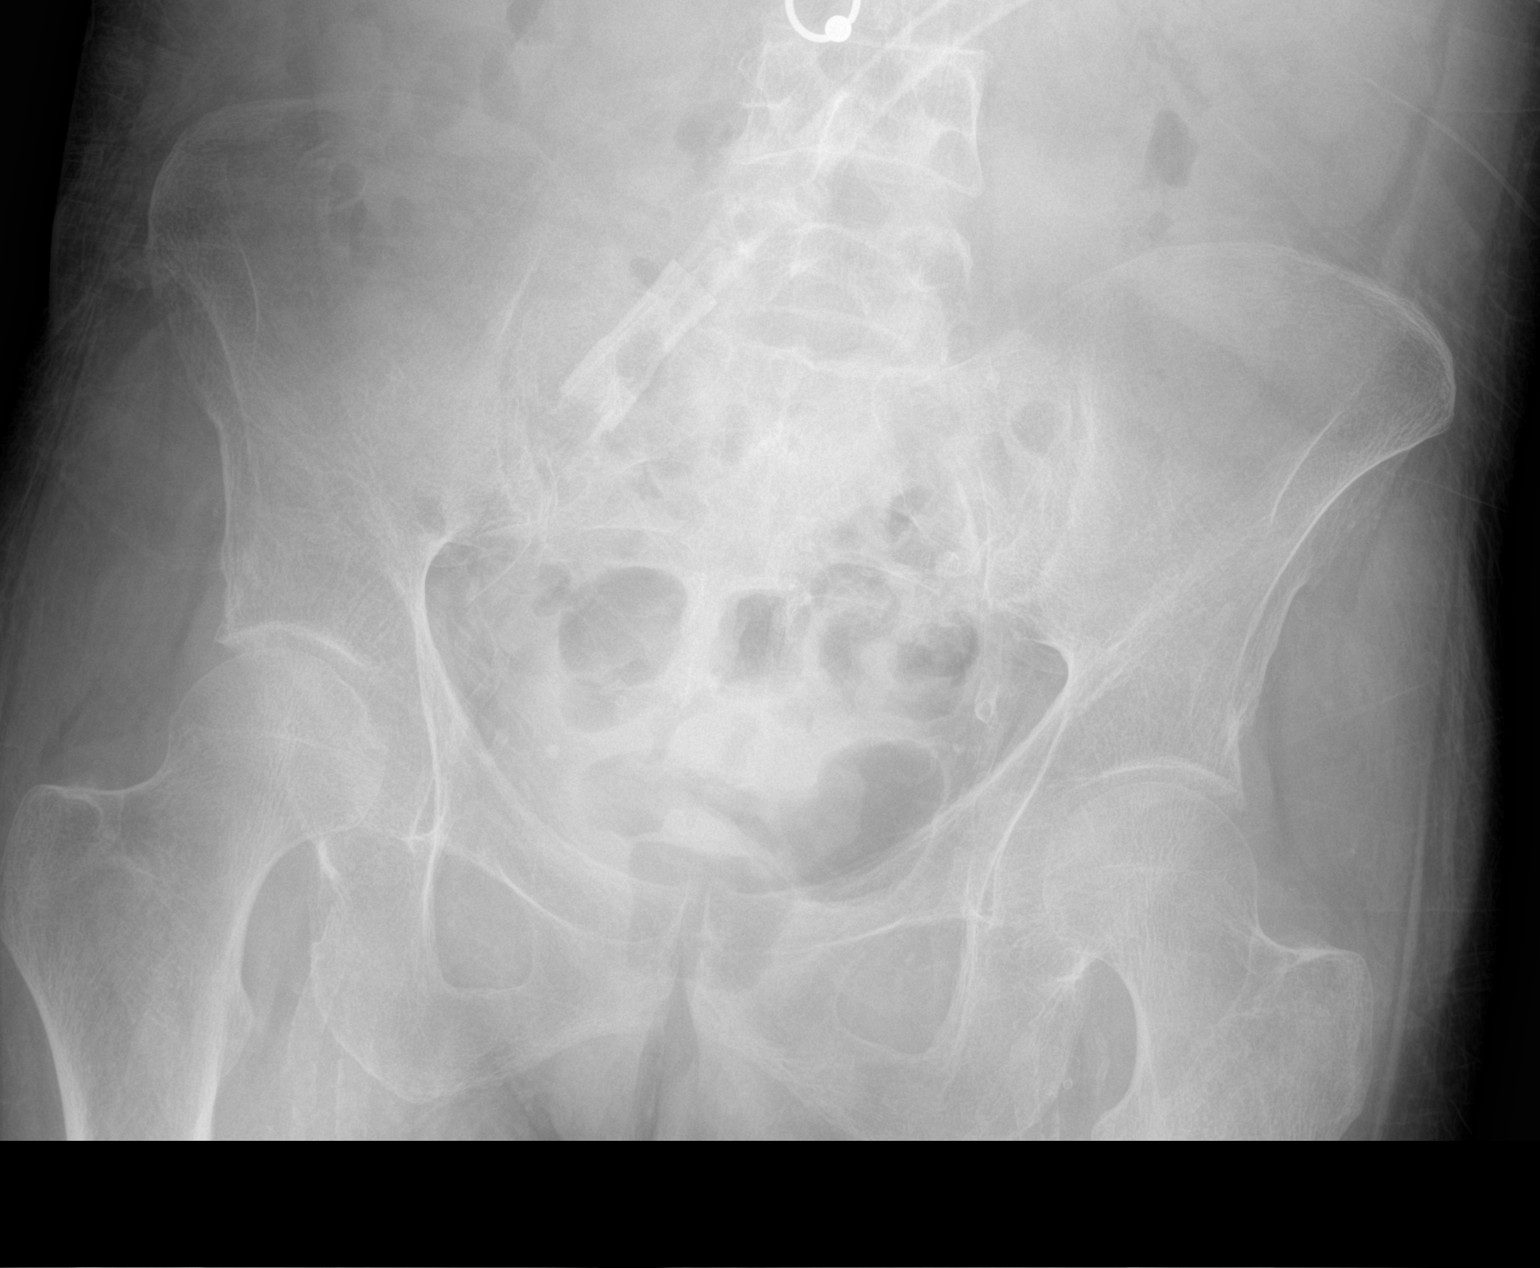

[2 of 2 positions shown; findings below may reference images not displayed]

FINDINGS: Scattered large and small bowel gas is noted. No abnormal mass or
abnormal calcifications are seen. Belly button ring is noted. No
bony abnormality is noted. Scattered vascular calcifications are
noted.
IMPRESSION: Umbilical ring is noted.

No other focal abnormality is noted.

## 2022-08-17 IMAGING — DX DG CHEST 1V PORT
1 series · 1 of 1 positions shown · non-contrast
Comparison: 11/21/2020

CLINICAL DATA: Altered mental status, initial encounter

EXAM:
PORTABLE CHEST 1 VIEW

[chest ap]
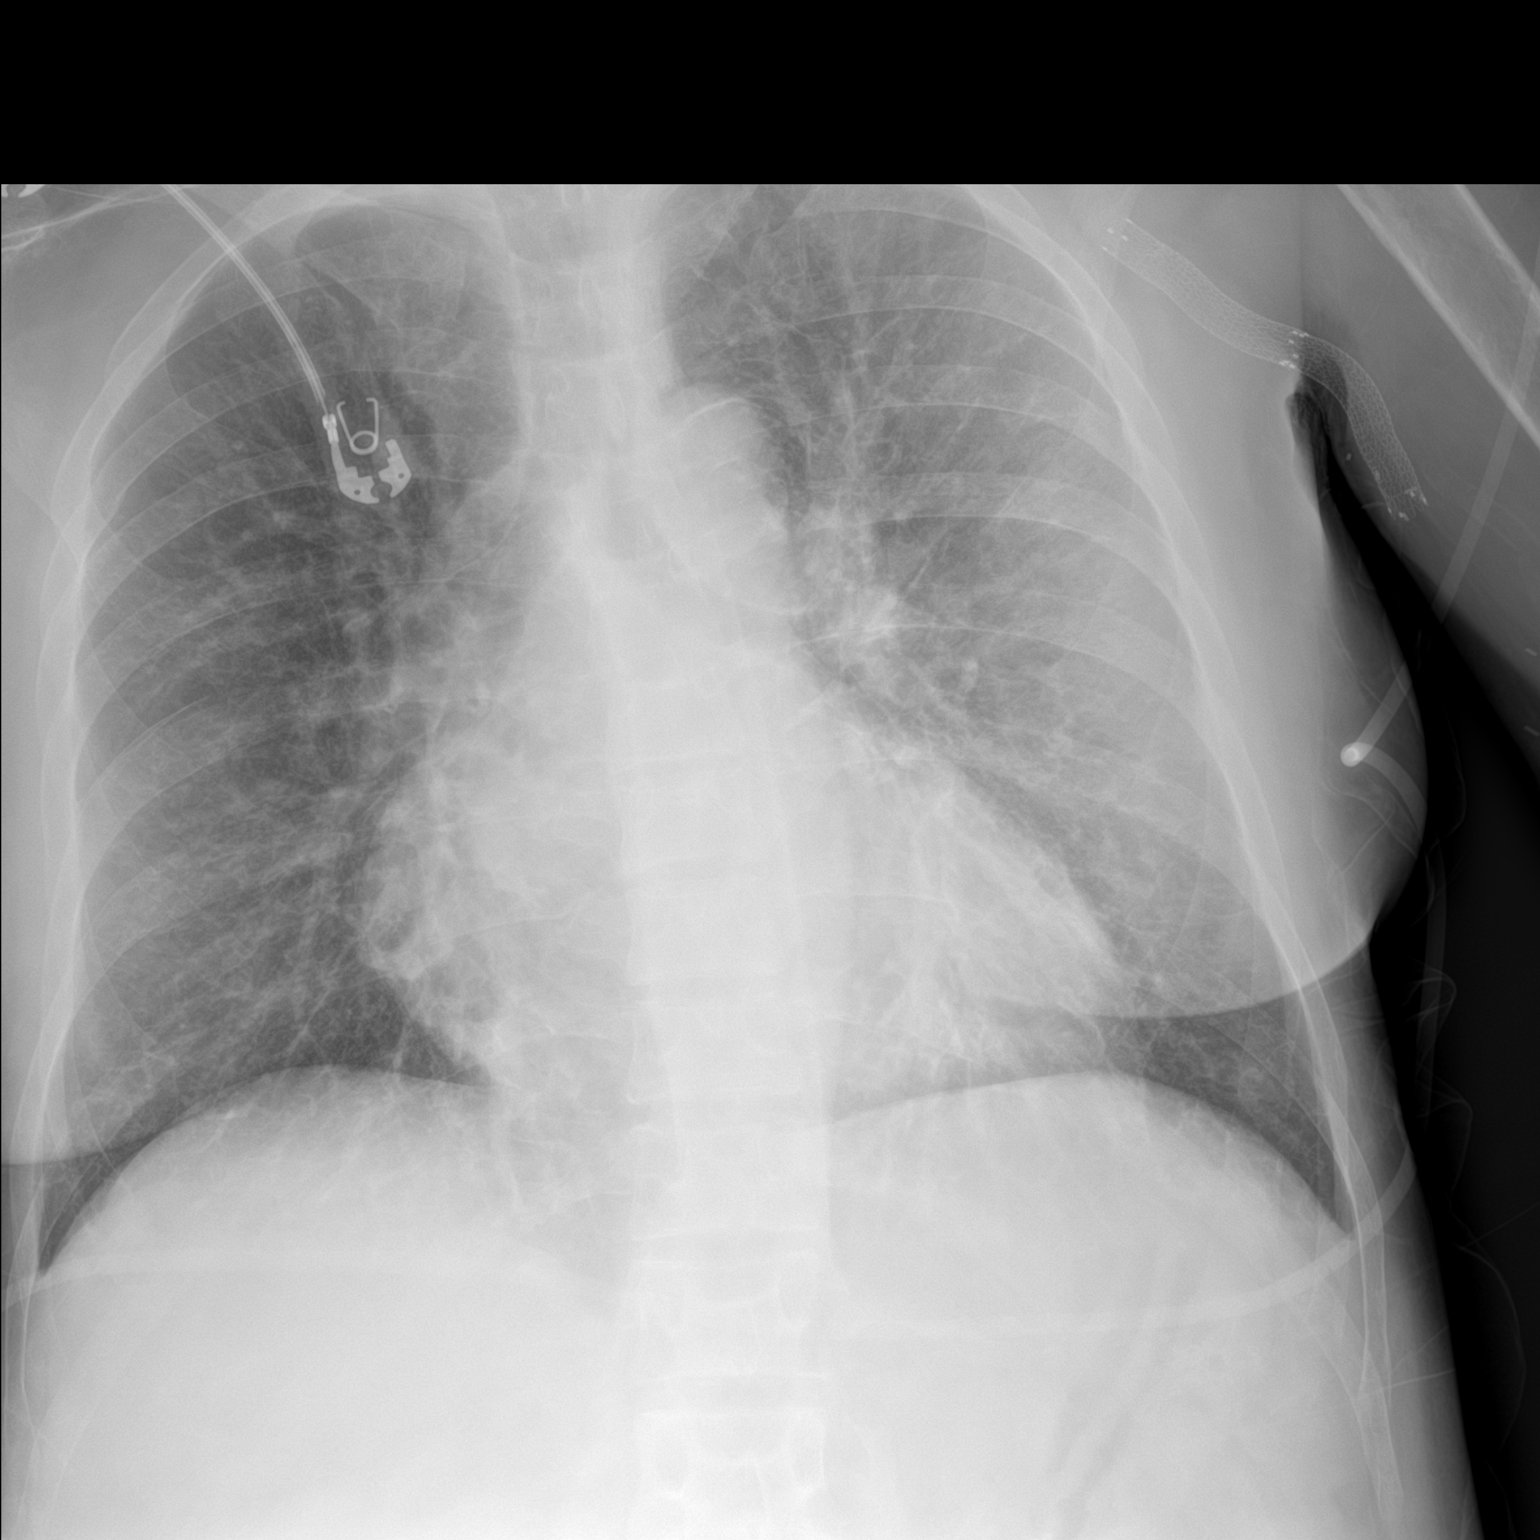

[1 of 1 positions shown; findings below may reference images not displayed]

FINDINGS: Shadow is enlarged but stable. Aortic calcifications are noted.
Lungs are well aerated bilaterally. Vascular congestion is seen with
mild interstitial edema. No focal infiltrate or effusion is seen. No
bony abnormality is noted.
IMPRESSION: Changes consistent with mild CHF. No other focal abnormality is
noted.

## 2022-08-18 IMAGING — CT CT HEAD W/O CM
4 series · 17 of 47 positions shown, 19 images · non-contrast
Comparison: 11/21/2020.

CLINICAL DATA: Delirium.

EXAM:
CT HEAD WITHOUT CONTRAST
TECHNIQUE: Contiguous axial images were obtained from the base of the skull
through the vertex without intravenous contrast.

[Series 3: head wo · axial · 0.45mm/px · z∈[-216,-90]mm · 7 of 35 slices shown, 9 images]
[im 5/35  brain]
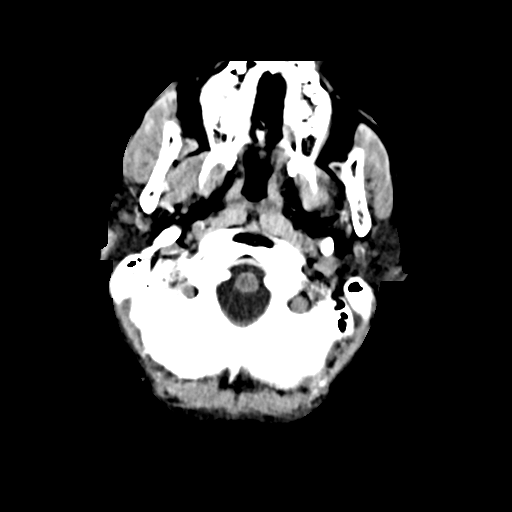
[im 5/35  bone]
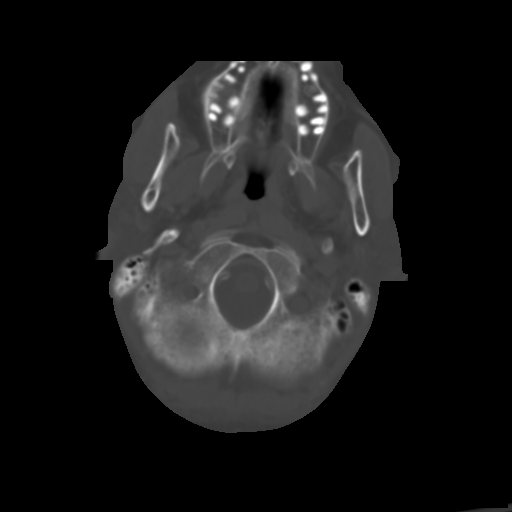
[im 9/35  brain]
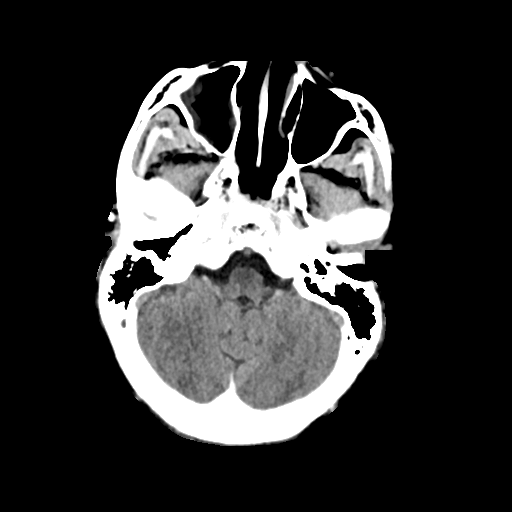
[im 13/35  brain]
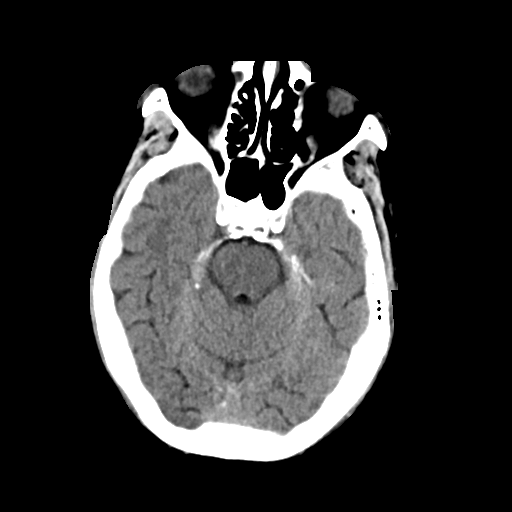
[im 18/35  brain]
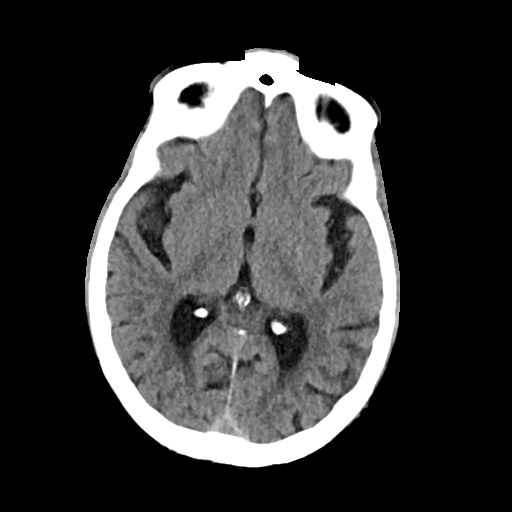
[im 22/35  brain]
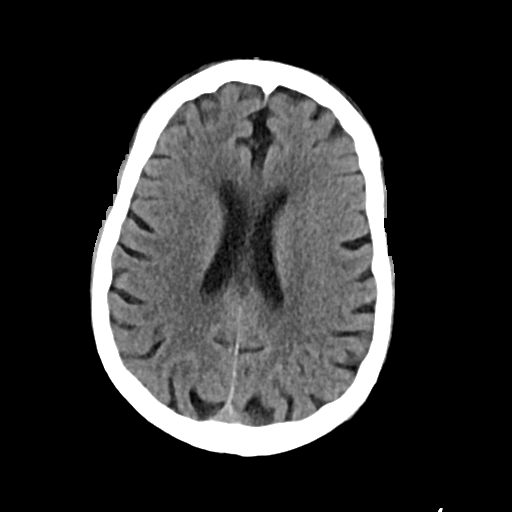
[im 22/35  bone]
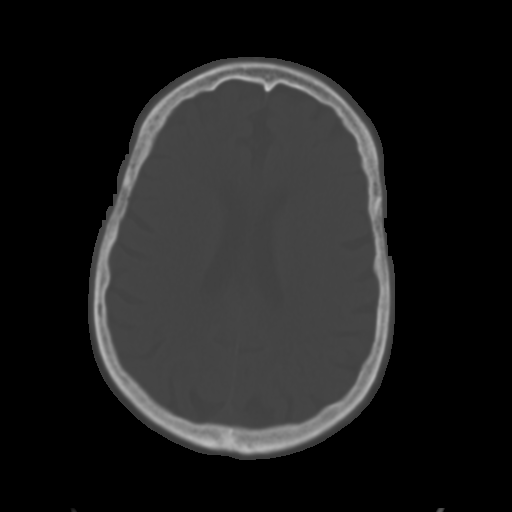
[im 26/35  brain]
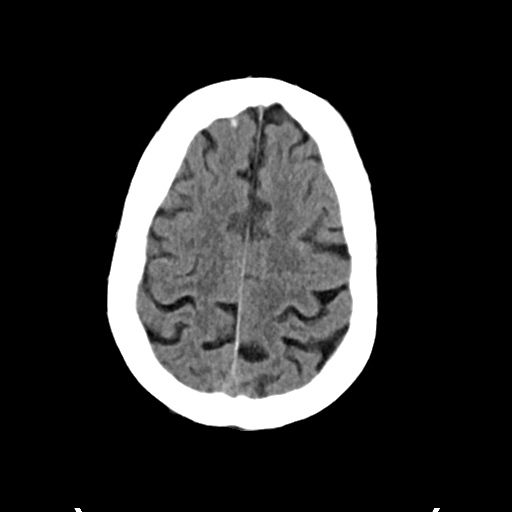
[im 30/35  brain]
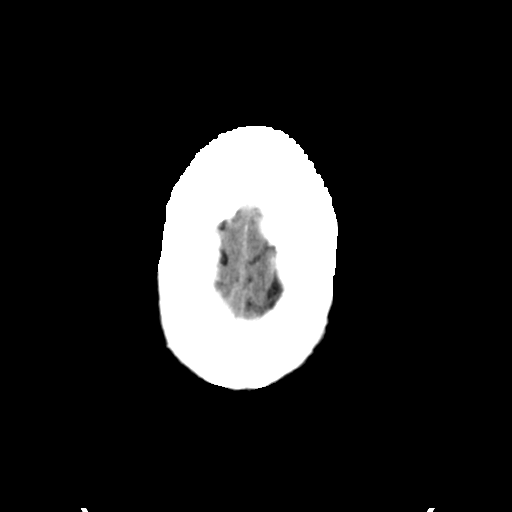

[Series 4: head bone · axial · 0.45mm/px · z∈[-220,-158]mm · 4 of 88 slices shown]
[im 9/88  bone]
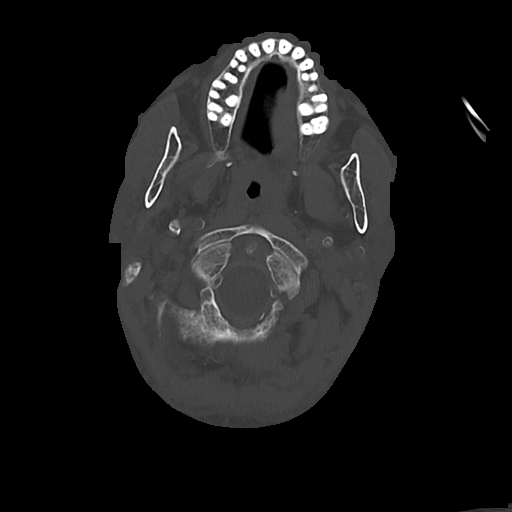
[im 18/88  bone]
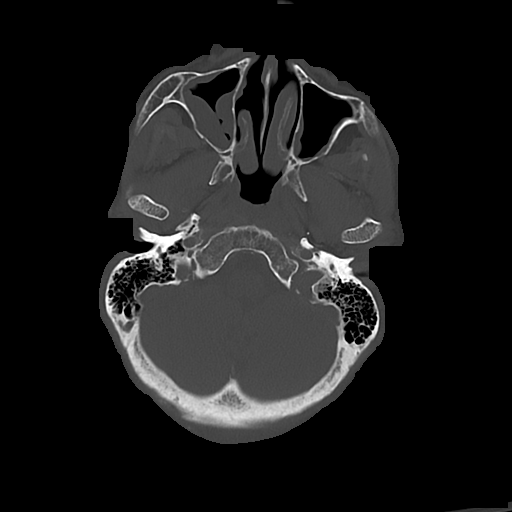
[im 27/88  bone]
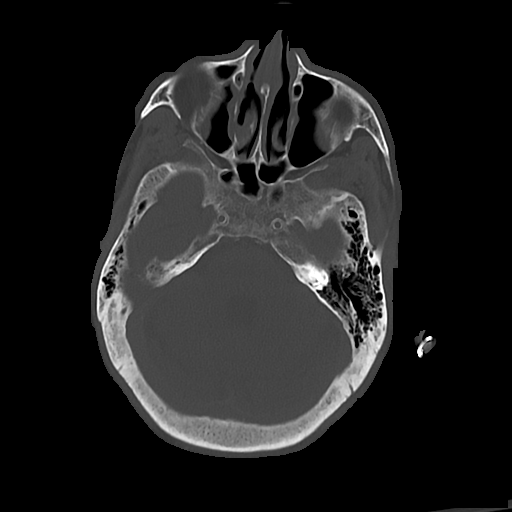
[im 40/88  bone]
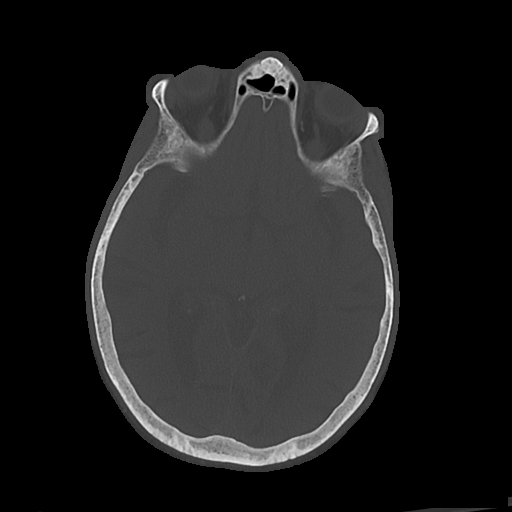

[Series 5: cor soft · coronal · 0.34mm/px · 3 of 71 slices shown]
[im 24/71  brain]
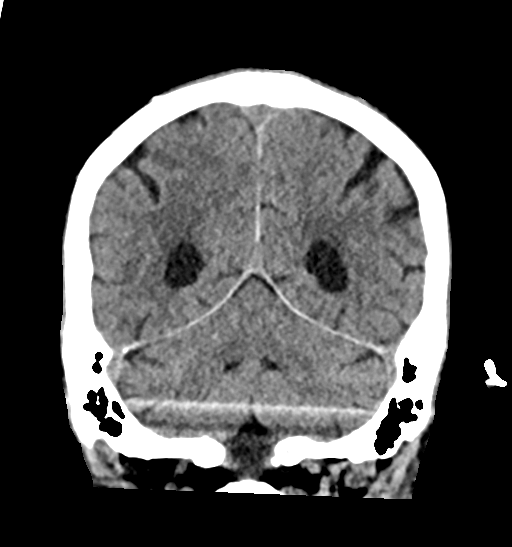
[im 32/71  brain]
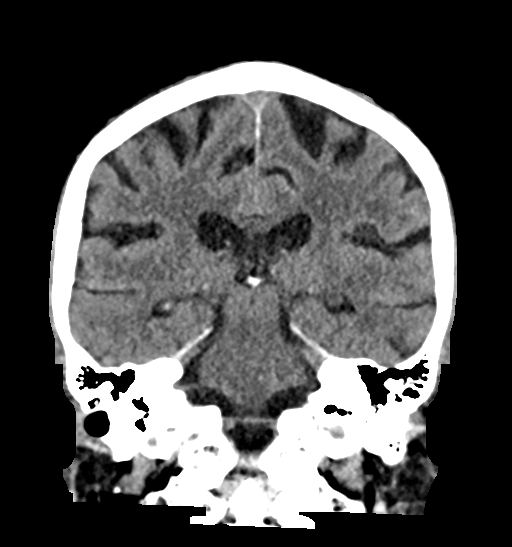
[im 39/71  brain]
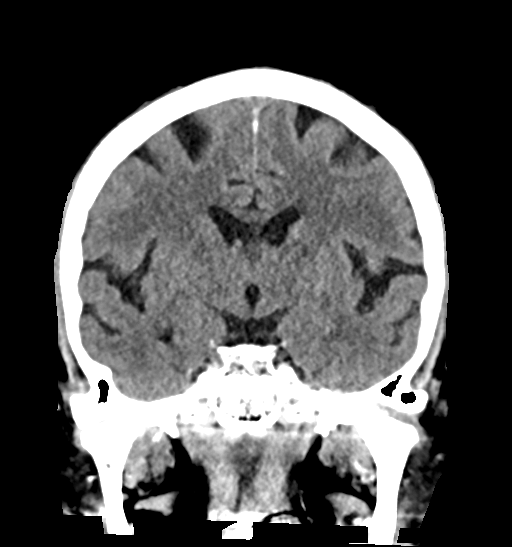

[Series 6: sag soft · sagittal · 0.40mm/px · 3 of 56 slices shown]
[im 19/56  brain]
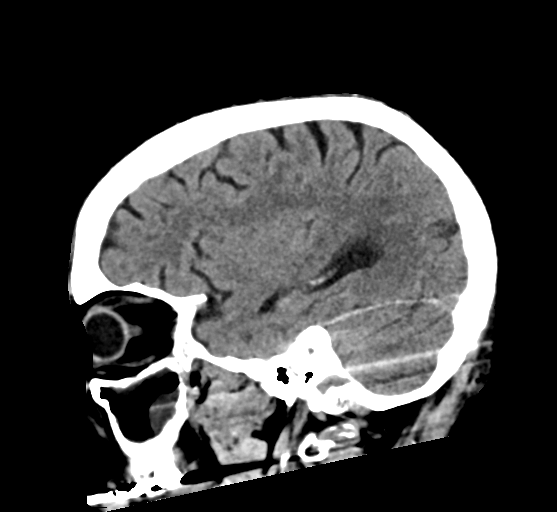
[im 28/56  brain]
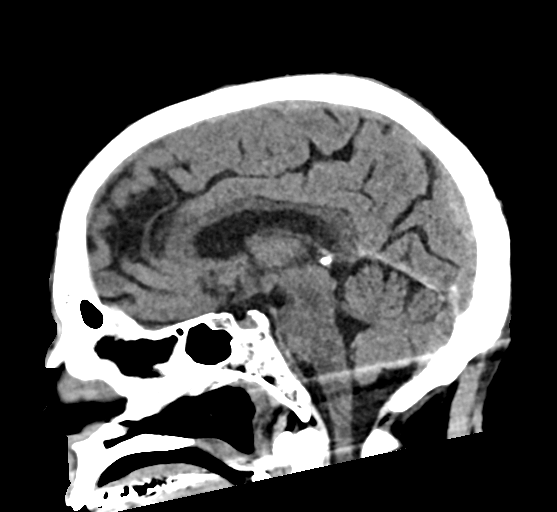
[im 37/56  brain]
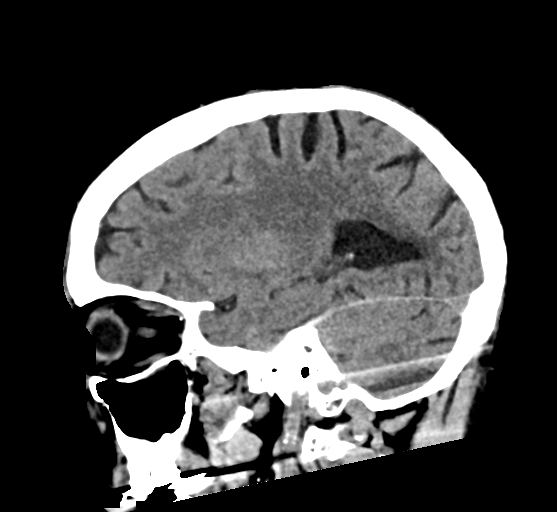

[17 of 47 positions shown; findings below may reference images not displayed]

FINDINGS: Brain: No acute intracranial hemorrhage, midline shift or mass
effect. No extra-axial fluid collection. Periventricular white
matter hypodensities are noted bilaterally. There is no
hydrocephalus.

Vascular: No hyperdense vessel. Atherosclerotic calcification of the
carotid siphons and vertebral arteries.

Skull: Normal. Negative for fracture or focal lesion.

Sinuses/Orbits: Diffuse paranasal sinus mucosal thickening. No acute
abnormality.

Other: Tubes are present in the oral cavity.
IMPRESSION: 1. No acute intracranial process.
2. Periventricular white matter hypodensities, may be associated
with chronic microvascular ischemic changes.

## 2022-08-18 IMAGING — DX DG CHEST 1V PORT
1 series · 1 of 1 positions shown · non-contrast
Comparison: 03/22/2021.

CLINICAL DATA: Intubation.

EXAM:
PORTABLE CHEST 1 VIEW

[chest ap]
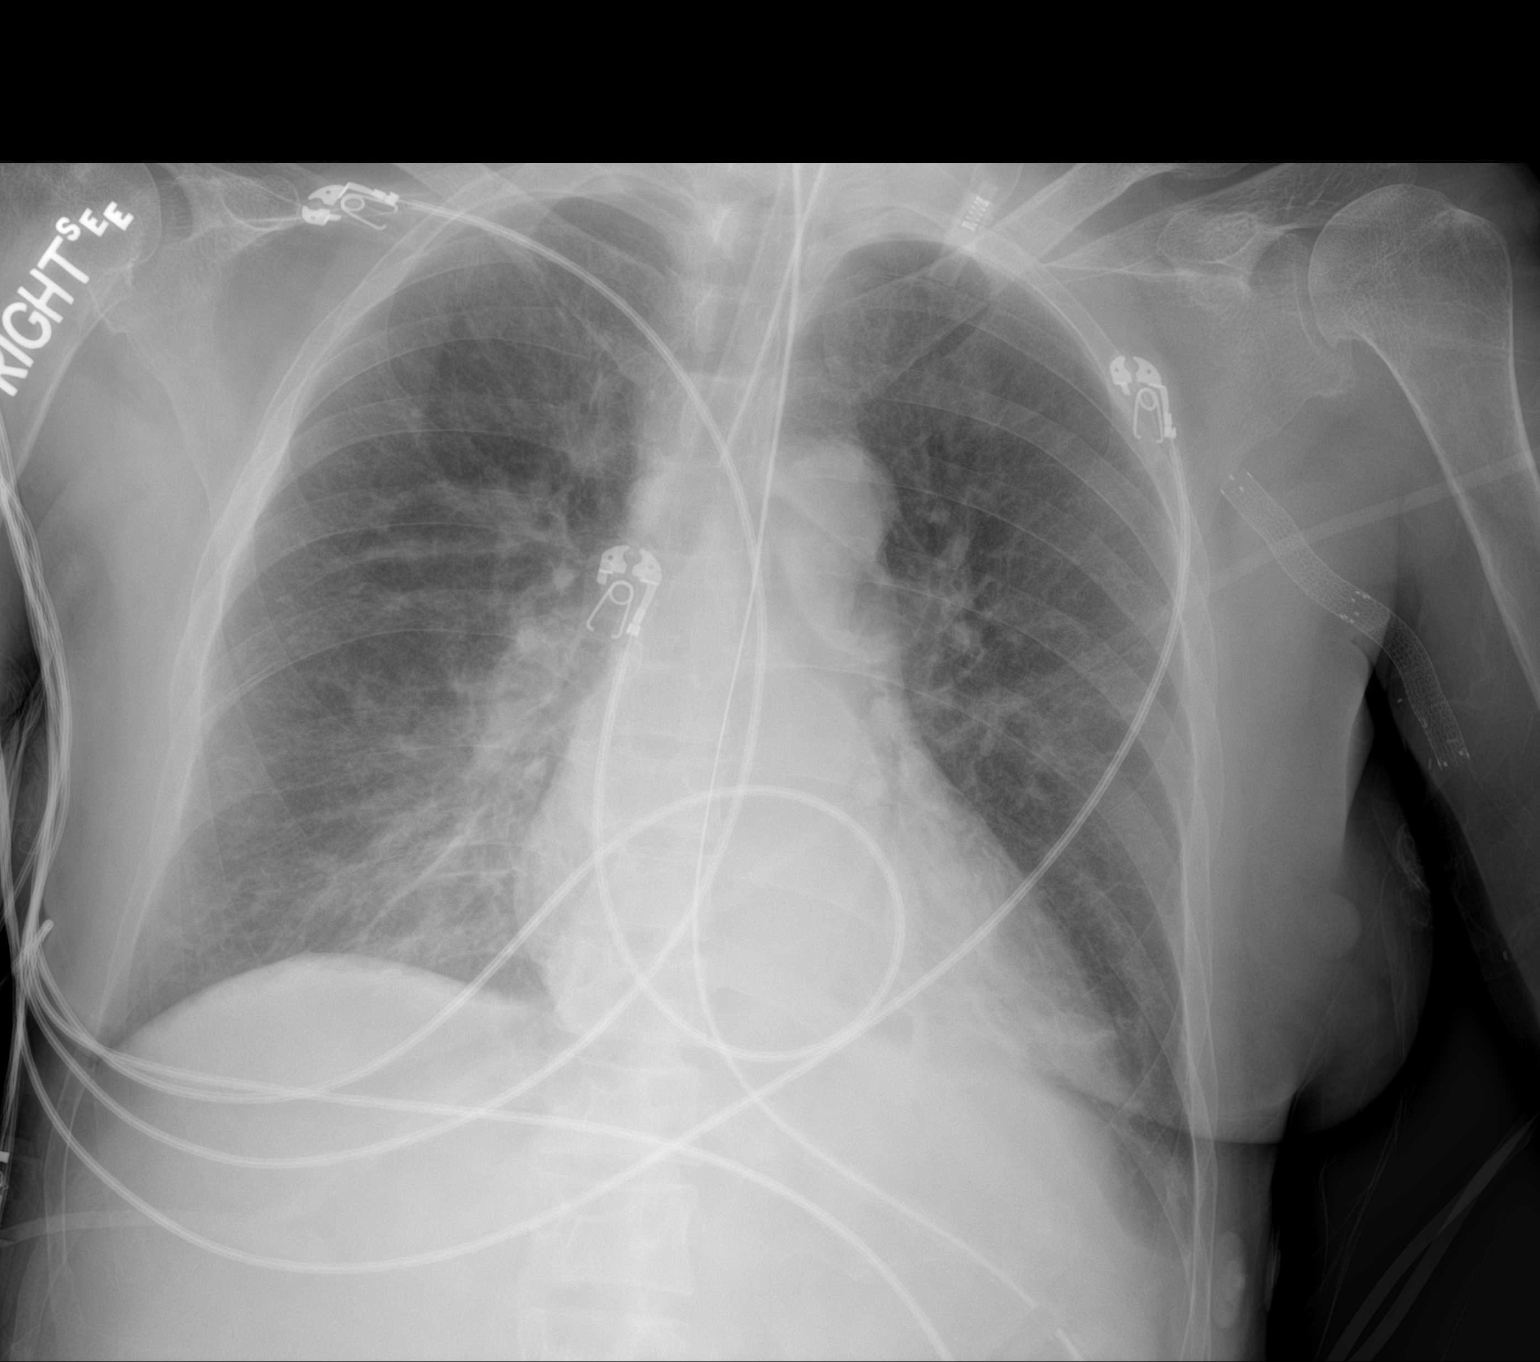

[1 of 1 positions shown; findings below may reference images not displayed]

FINDINGS: The heart is mildly enlarged and the mediastinal structures are
within normal limits. Pulmonary vascular congestion is noted
bilaterally. Interstitial prominence is noted bilaterally and patchy
airspace disease is seen at the left lung base. There is a small
left pleural effusion. No pneumothorax is seen. A vascular stent is
noted in the left axilla. An enteric tube courses over the stomach
and out of the field of view. The endotracheal tube terminates 1 cm
above the carina. No acute osseous abnormality.
IMPRESSION: 1. Cardiomegaly with pulmonary vascular congestion.
2. Interstitial prominence bilaterally with patchy airspace disease
at the lung bases, possible edema, atelectasis, or infiltrate.
3. Small left pleural effusion.
4. Medical devices as described above.

## 2022-08-20 IMAGING — DX DG CHEST 1V PORT
1 series · 1 of 1 positions shown · non-contrast
Comparison: 03/23/2021

CLINICAL DATA: Respiratory failure, ventilatory support

EXAM:
PORTABLE CHEST 1 VIEW

[chest ap]
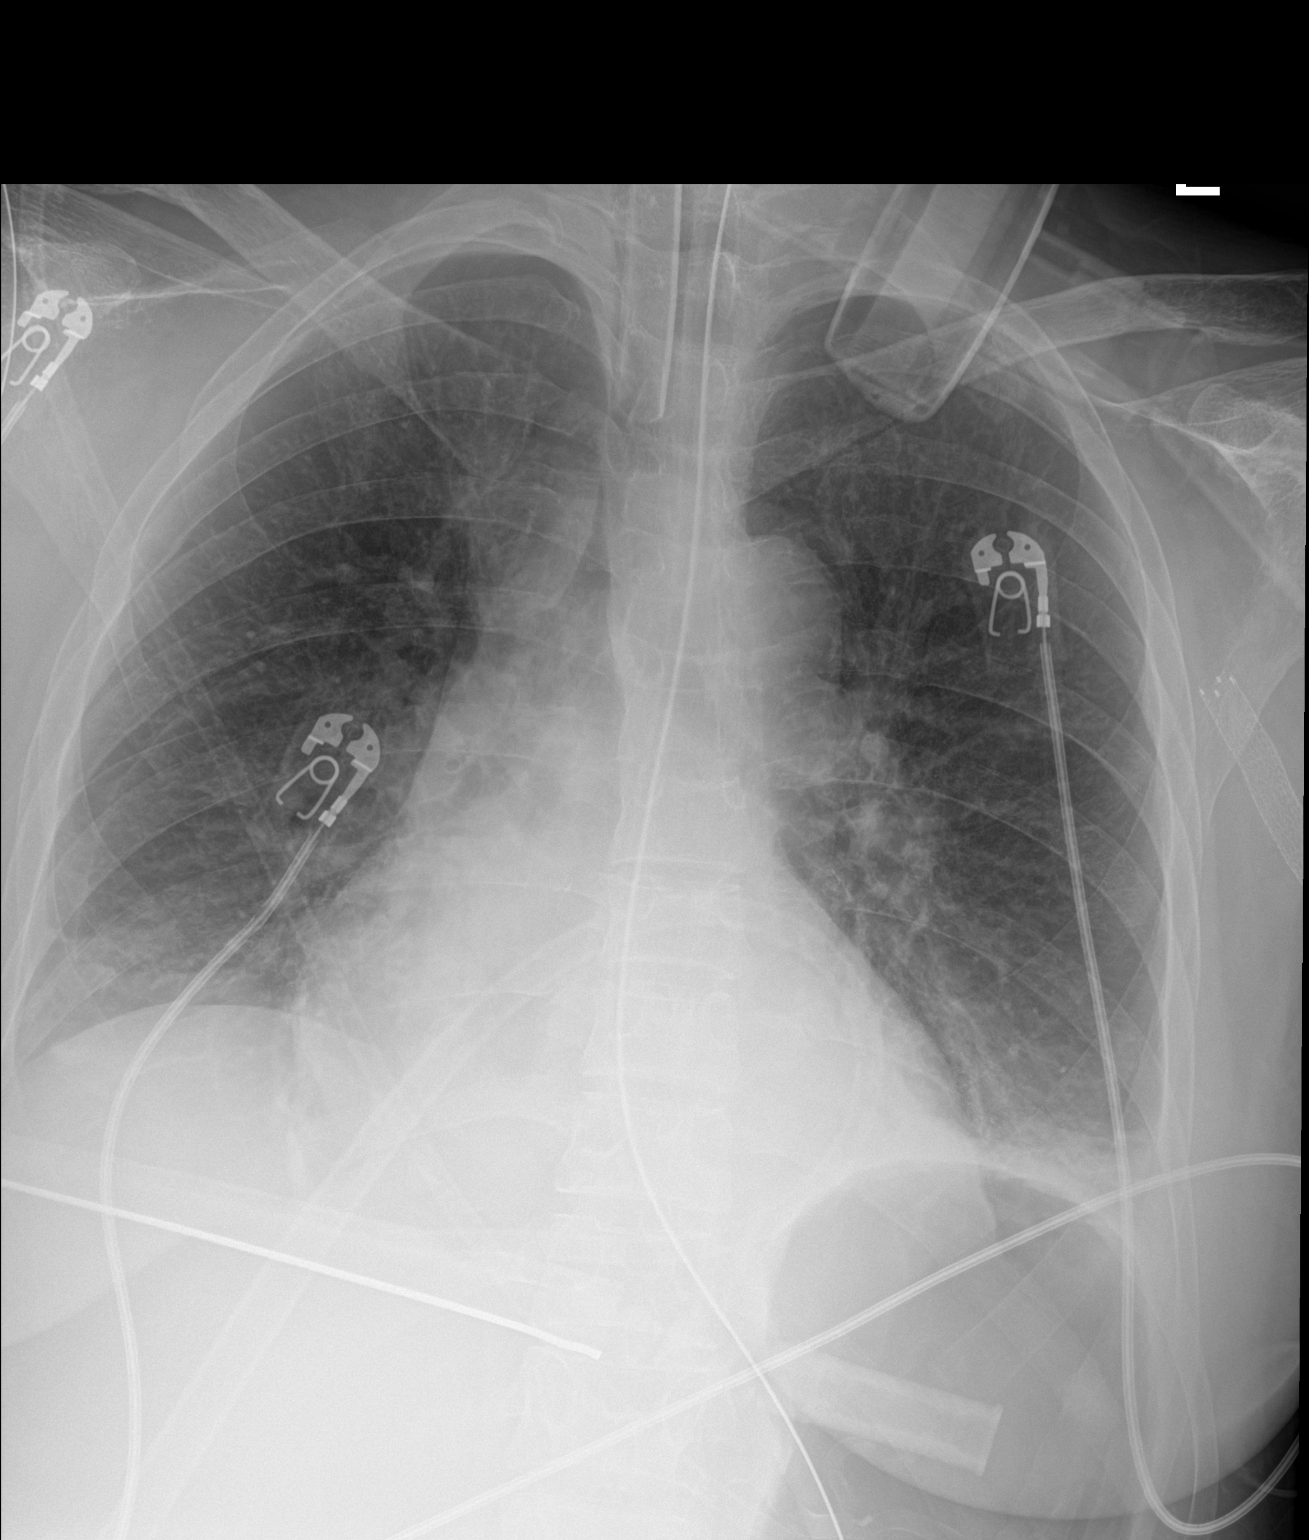

[1 of 1 positions shown; findings below may reference images not displayed]

FINDINGS: Endotracheal tube 4.7 cm above the carina. NG tube extends below the
hemidiaphragms into the stomach. Stomach remains air distended.

Stable cardiomegaly with slightly lower lung volumes and bibasilar
atelectasis, worse on the left. No enlarging effusion. No
pneumothorax.
IMPRESSION: Stable cardiomegaly. Lower lung volumes with increased basilar
atelectasis.

## 2022-08-21 IMAGING — CT CT ANGIO HEAD-NECK (W OR W/O PERF)
2 of 7 series · 8 of 33 positions shown · IV contrast (OMNI 350)
Comparison: None.

CLINICAL DATA: Acute neurologic deficit

EXAM:
CT ANGIOGRAPHY HEAD AND NECK
TECHNIQUE: Multidetector CT imaging of the head and neck was performed using
the standard protocol during bolus administration of intravenous
contrast. Multiplanar CT image reconstructions and MIPs were
obtained to evaluate the vascular anatomy. Carotid stenosis
measurements (when applicable) are obtained utilizing NASCET
criteria, using the distal internal carotid diameter as the
denominator.
CONTRAST:  75mL OMNIPAQUE IOHEXOL 350 MG/ML SOLN

[Series 6: cta neck · axial · 0.54mm/px · z∈[+1205,+1321]mm · 2 of 174 slices shown]
[im 58/174  soft-tissue]
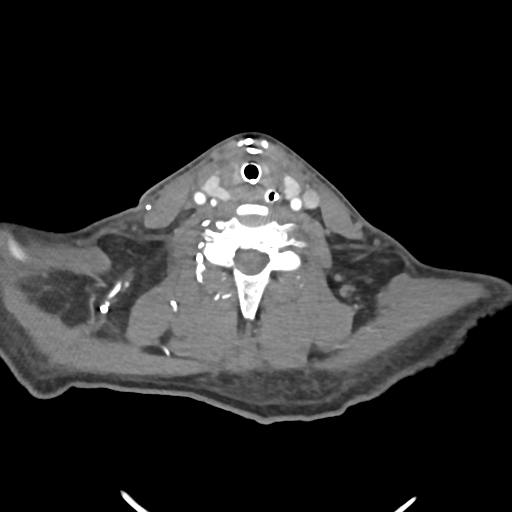
[im 116/174  soft-tissue]
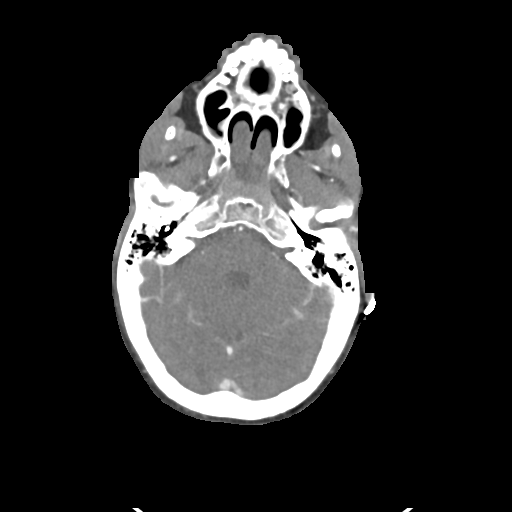

[Series 8: cta neck axial · axial · 0.39mm/px · z∈[+1118,+1361]mm · 6 of 347 slices shown]
[im 50/347  soft-tissue]
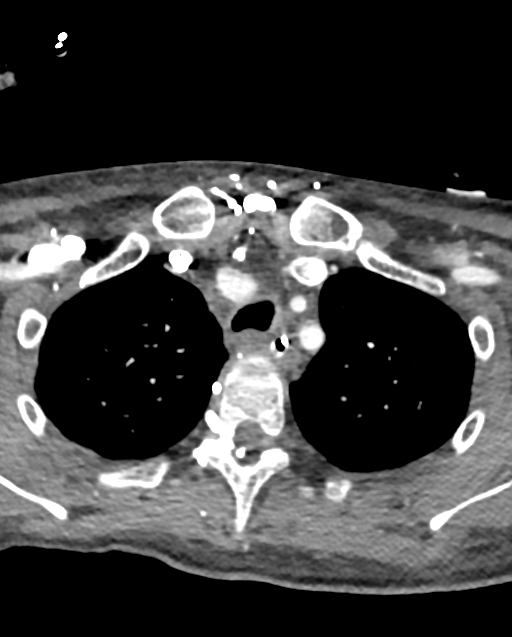
[im 99/347  bone]
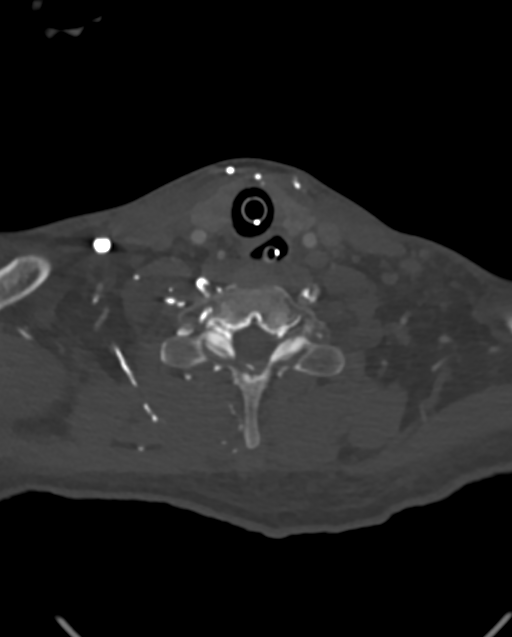
[im 149/347  soft-tissue]
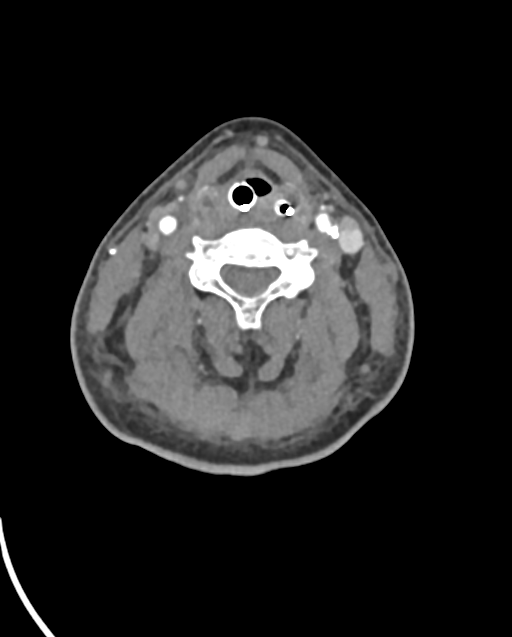
[im 198/347  bone]
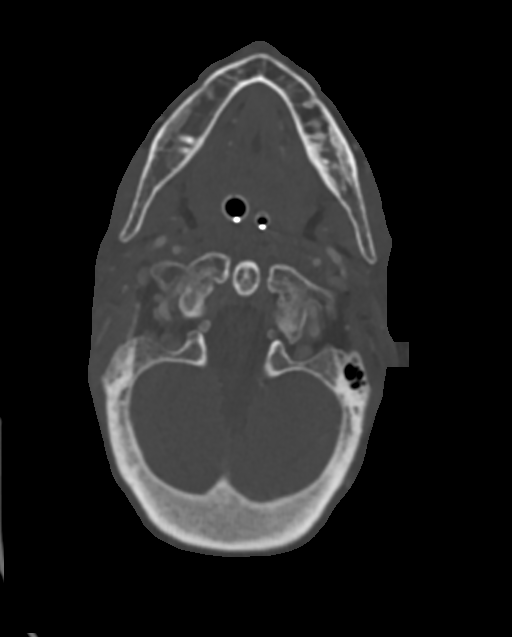
[im 248/347  soft-tissue]
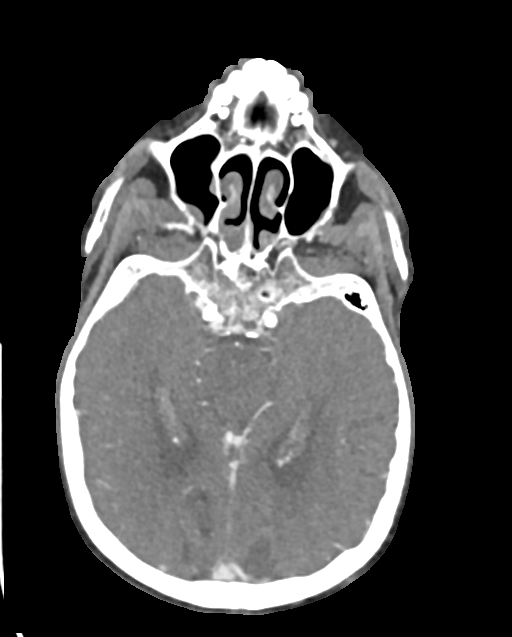
[im 297/347  bone]
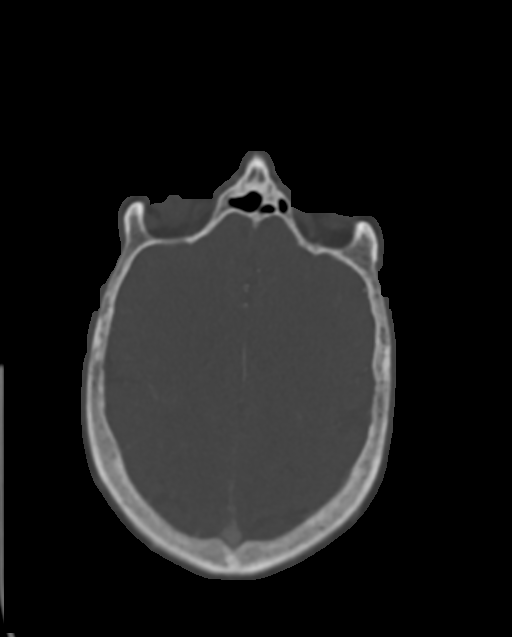

[8 of 33 positions shown; findings below may reference images not displayed]

FINDINGS: CTA NECK FINDINGS

SKELETON: There is no bony spinal canal stenosis. No lytic or
blastic lesion.

OTHER NECK: Normal pharynx, larynx and major salivary glands. No
cervical lymphadenopathy. Unremarkable thyroid gland.

UPPER CHEST: Small pleural effusions with dependent atelectasis.

AORTIC ARCH:

There is no calcific atherosclerosis of the aortic arch. There is no
aneurysm, dissection or hemodynamically significant stenosis of the
visualized portion of the aorta. Conventional 3 vessel aortic
branching pattern. The visualized proximal subclavian arteries are
widely patent.

RIGHT CAROTID SYSTEM: Normal without aneurysm, dissection or
stenosis.

LEFT CAROTID SYSTEM: Normal without aneurysm, dissection or
stenosis.

VERTEBRAL ARTERIES: Left dominant configuration. Both origins are
clearly patent. There is no dissection, occlusion or flow-limiting
stenosis to the skull base (V1-V3 segments).

CTA HEAD FINDINGS

POSTERIOR CIRCULATION:

--Vertebral arteries: Normal left dominant configuration of V4
segments.

--Inferior cerebellar arteries: Normal.

--Basilar artery: Normal.

--Superior cerebellar arteries: Normal.

--Posterior cerebral arteries (PCA): Normal.

ANTERIOR CIRCULATION:

--Intracranial internal carotid arteries: Atherosclerotic
calcification of the internal carotid arteries at the skull base
without hemodynamically significant stenosis. If

--Anterior cerebral arteries (ACA): Normal. Both A1 segments are
present. Patent anterior communicating artery (a-comm).

--Middle cerebral arteries (MCA): Normal.

VENOUS SINUSES: As permitted by contrast timing, patent.

ANATOMIC VARIANTS: None

Review of the MIP images confirms the above findings.
IMPRESSION: 1. No emergent large vessel occlusion or high-grade stenosis of the
intracranial arteries.
2. Small pleural effusions with dependent atelectasis.

## 2022-08-22 IMAGING — DX DG CHEST 1V PORT
1 series · 1 of 1 positions shown · non-contrast
Comparison: 03/25/2021.

CLINICAL DATA: Endotracheal tube.

EXAM:
PORTABLE CHEST 1 VIEW

[chest]
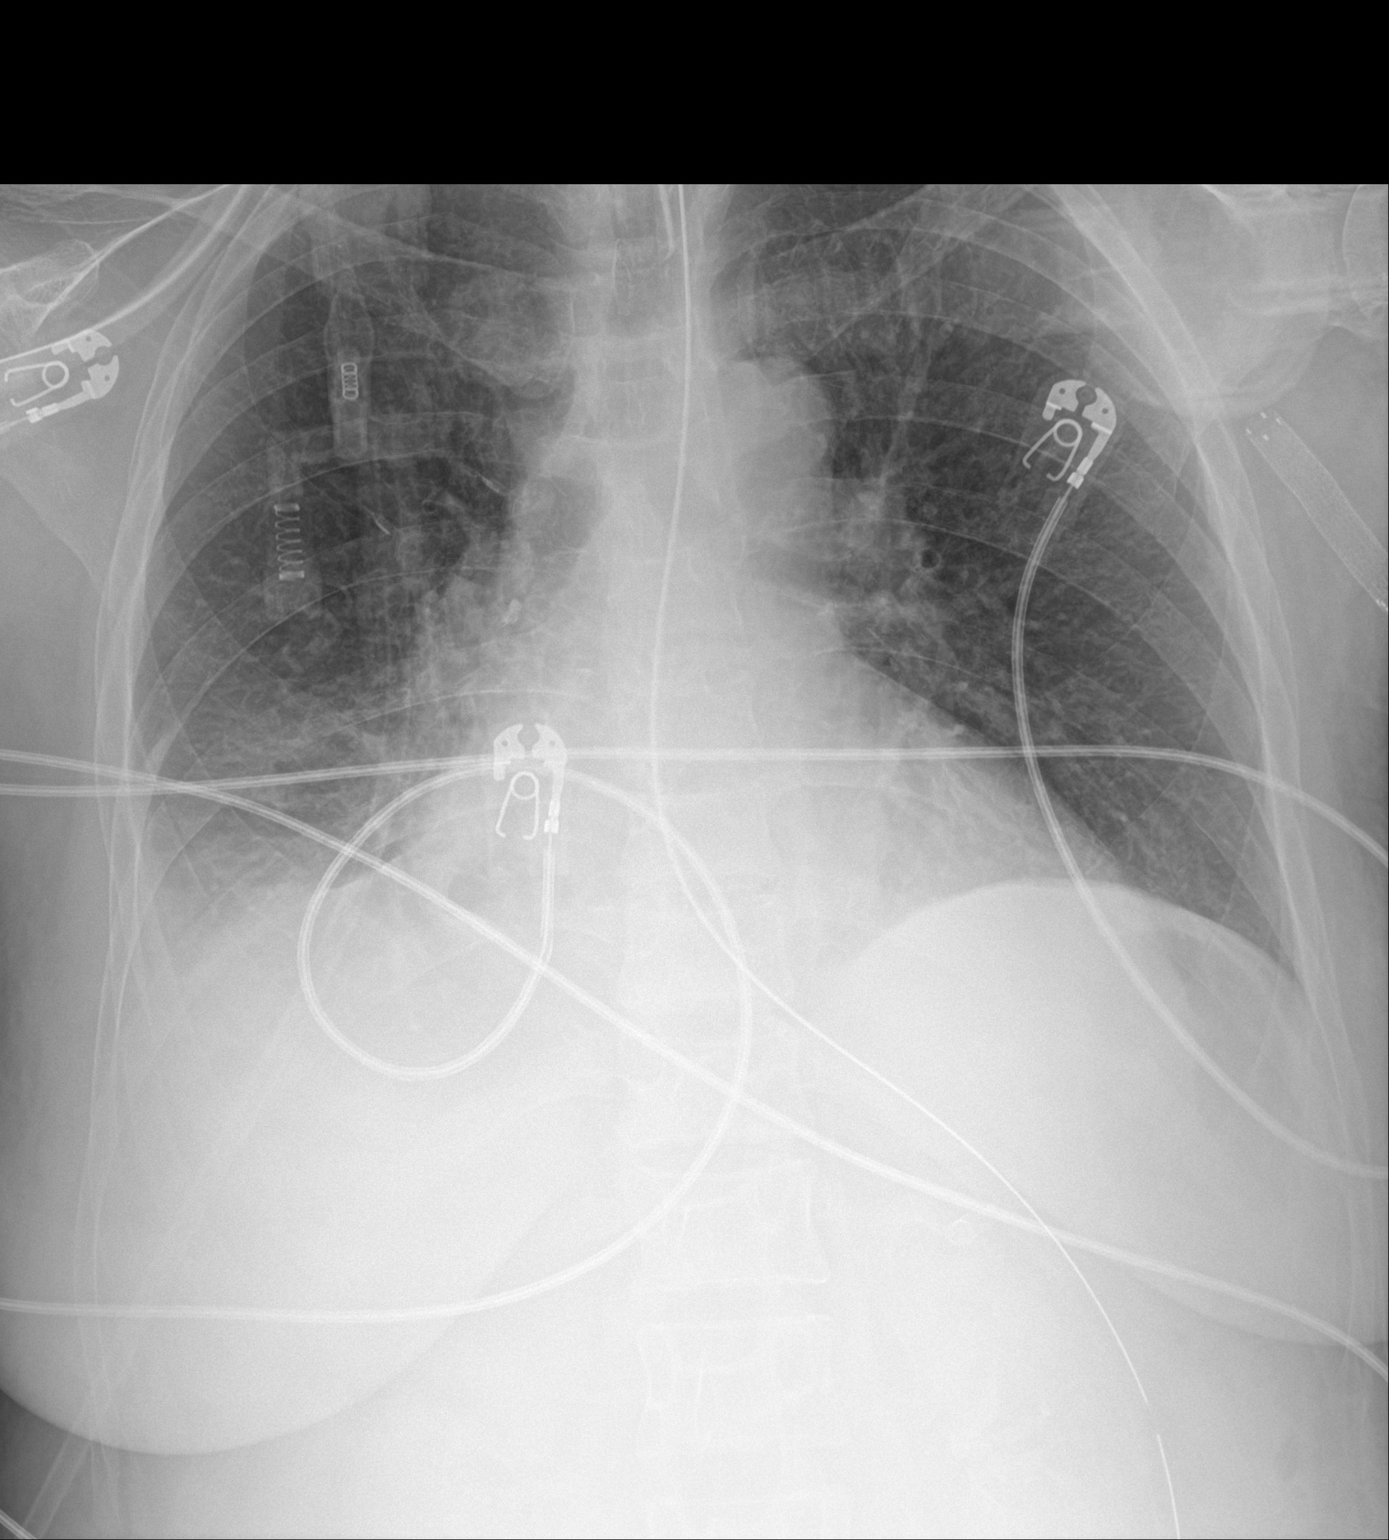

[1 of 1 positions shown; findings below may reference images not displayed]

FINDINGS: The heart is enlarged and the mediastinal structures are stable. The
pulmonary vasculature is stable. Patchy airspace disease is present
at the right lung base and there is a small right pleural effusion.
The left lung is clear. No pneumothorax. No acute osseous
abnormality. The endotracheal tube terminates 4.6 cm above the
carina. An enteric tube courses over the stomach and out of the
field of view.
IMPRESSION: 1. Small right pleural effusion with atelectasis or infiltrate at
the right lung base.
2. Cardiomegaly.
3. Medical devices as described above.

## 2022-08-22 IMAGING — MR MR HEAD W/O CM
9 of 11 series · 31 of 48 positions shown · non-contrast
Comparison: Noncontrast head CT and CT angiogram head/neck
03/26/2021. Brain MRI 11/24/2020.

CLINICAL DATA: Stroke, follow-up. Additional history provided:
Patient found altered with hyperglycemia.

EXAM:
MRI HEAD WITHOUT CONTRAST
TECHNIQUE: Multiplanar, multiecho pulse sequences of the brain and surrounding
structures were obtained without intravenous contrast.

[Series 2: DWI · axial · 3.0mm · 0.94mm/px · z∈[-40,+100]mm · 7 of 100 slices shown (1 of 2)]
[im 1/100]
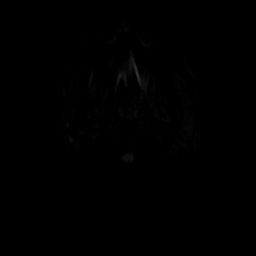
[im 17/100]
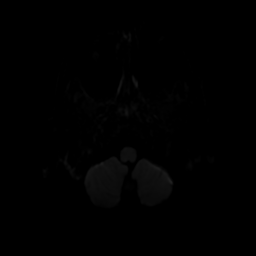
[im 34/100]
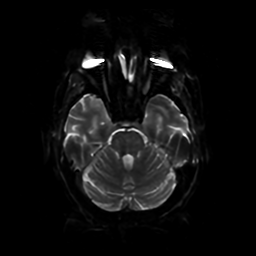
[im 50/100]
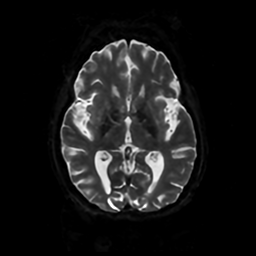
[im 67/100]
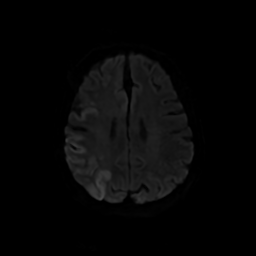
[im 83/100]
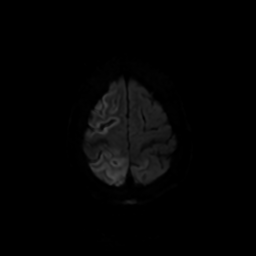
[im 100/100]
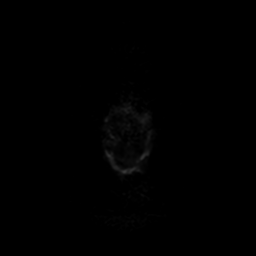

[Series 3: DWI · coronal · 4.0mm · 0.94mm/px · 5 of 71 slices shown (2 of 2)]
[im 1/71]
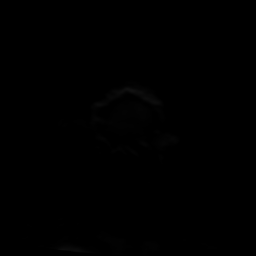
[im 18/71]
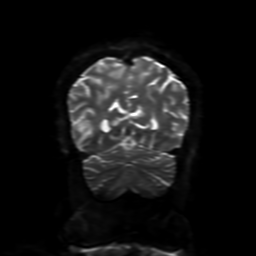
[im 36/71]
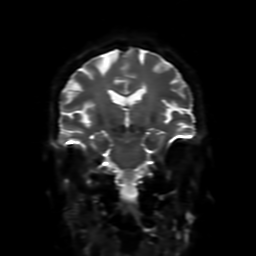
[im 53/71]
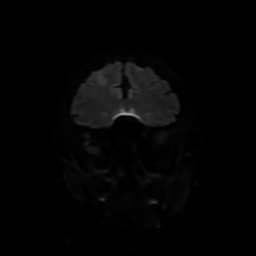
[im 71/71]
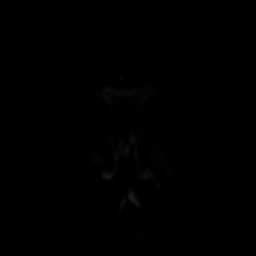

[Series 4: FLAIR · sagittal · 5.0mm · 0.47mm/px · 2 of 25 slices shown (1 of 2)]
[im 1/25]
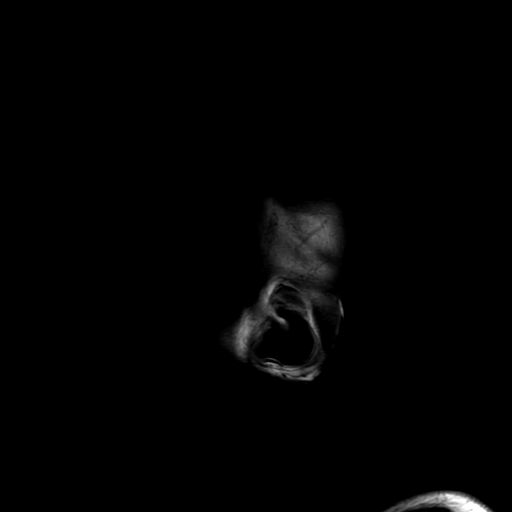
[im 25/25]
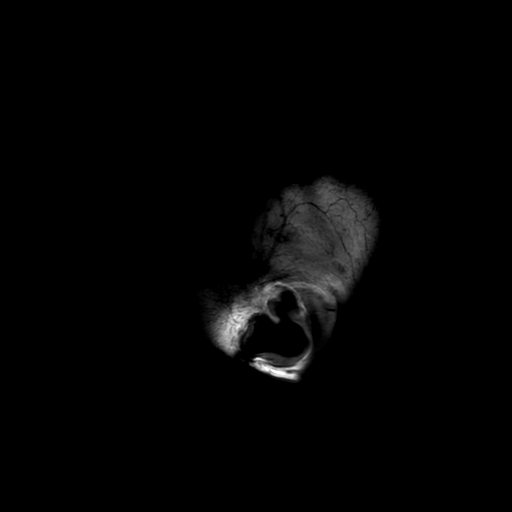

[Series 5: T2 · axial · 5.0mm · 0.43mm/px · z∈[-38,+99]mm · 2 of 25 slices shown (1 of 2)]
[im 1/25]
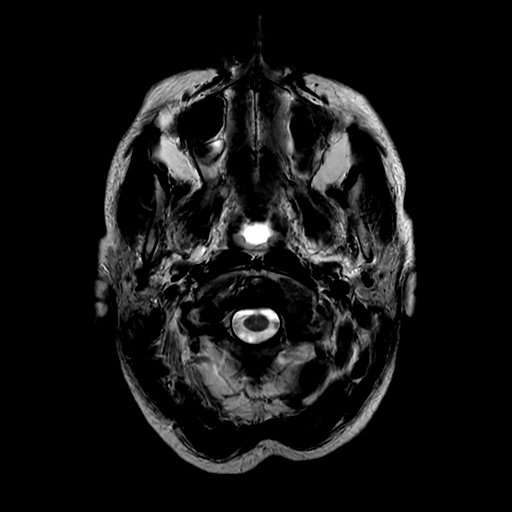
[im 25/25]
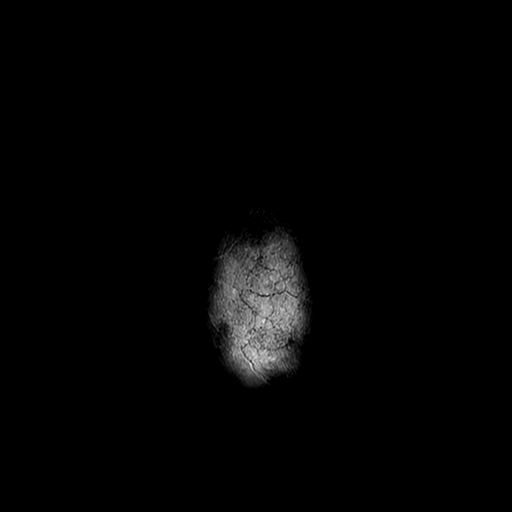

[Series 6: FLAIR · axial · 4.0mm · 0.45mm/px · z∈[-38,+100]mm · 3 of 34 slices shown (2 of 2)]
[im 1/34]
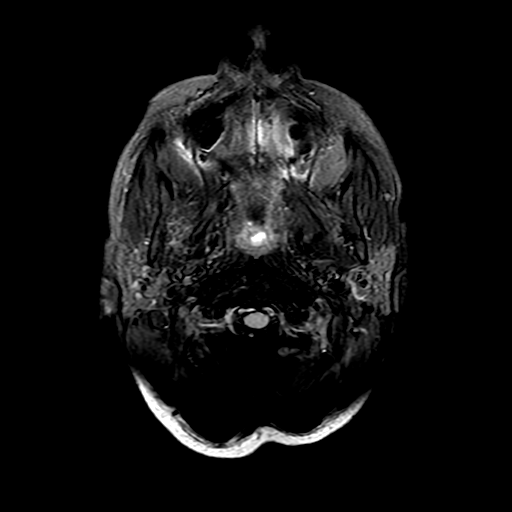
[im 17/34]
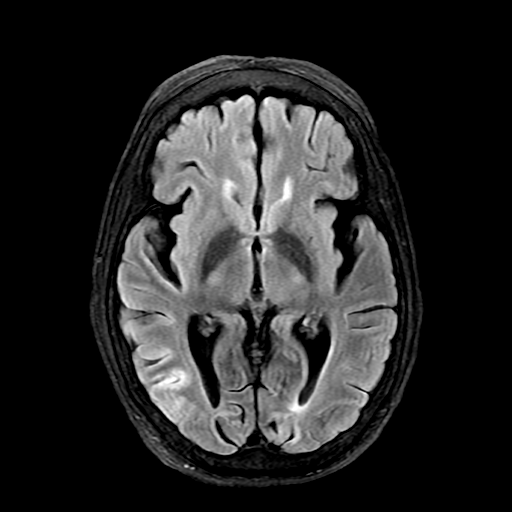
[im 34/34]
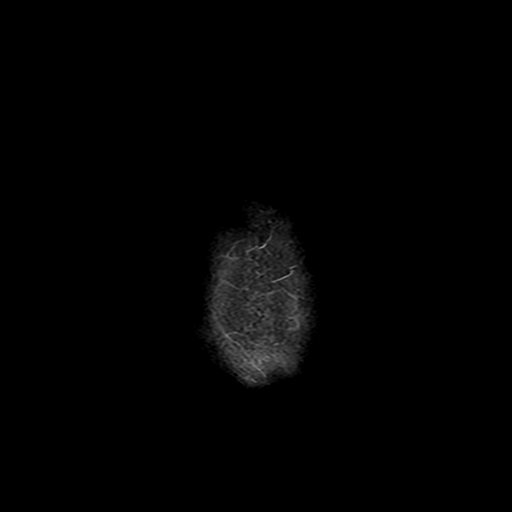

[Series 7: (person_name) · axial · 3.0mm · 0.47mm/px · z∈[-39,+1]mm · 3 of 100 slices shown]
[im 1/100]
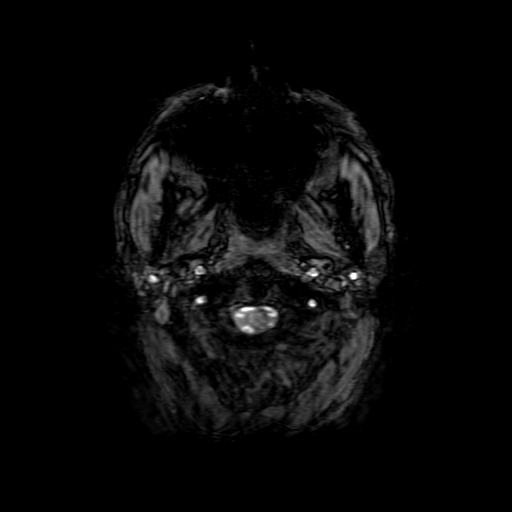
[im 15/100]
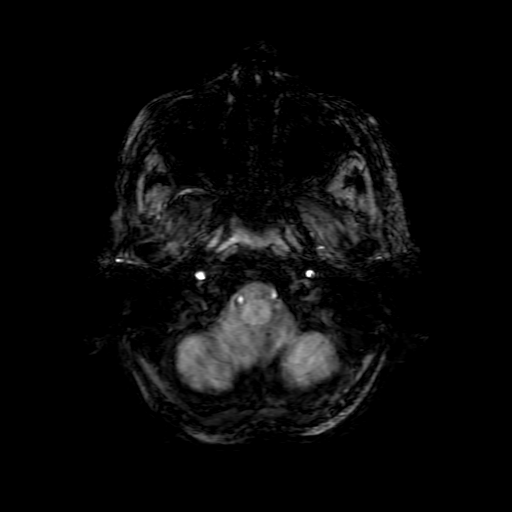
[im 29/100]
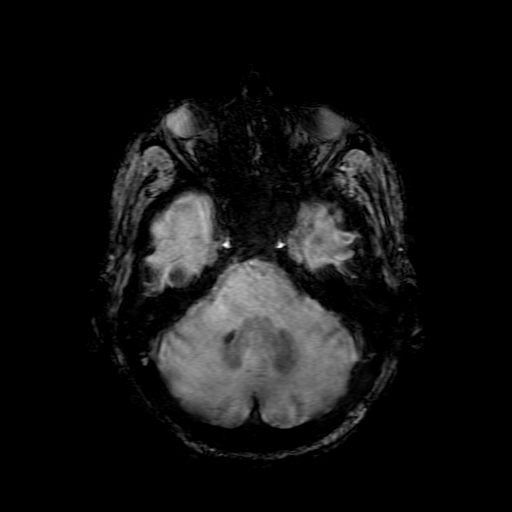

[Series 9: T2 · coronal · 5.0mm · 0.39mm/px · 2 of 32 slices shown (2 of 2)]
[im 1/32]
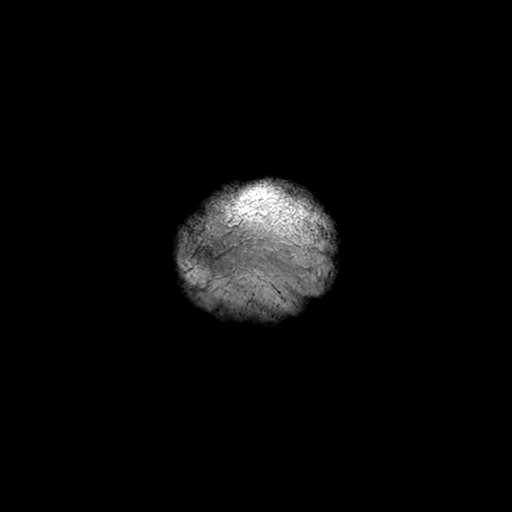
[im 32/32]
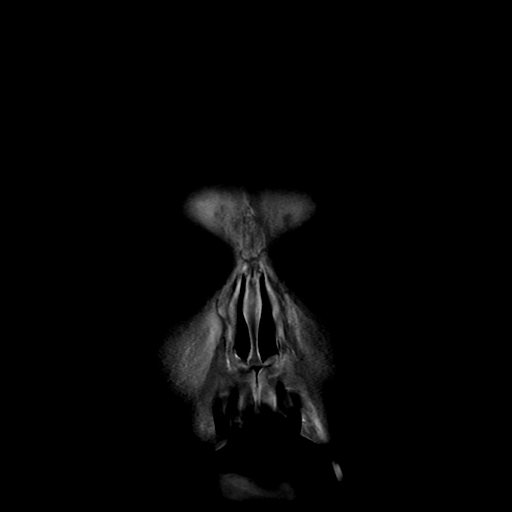

[Series 250: ADC · axial · 3.0mm · 0.94mm/px · z∈[-40,+100]mm · 4 of 50 slices shown (1 of 2)]
[im 1/50]
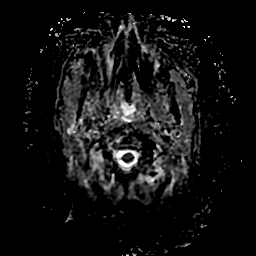
[im 17/50]
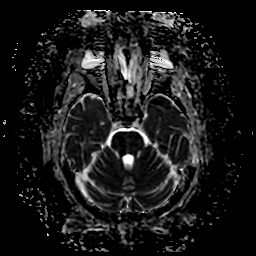
[im 33/50]
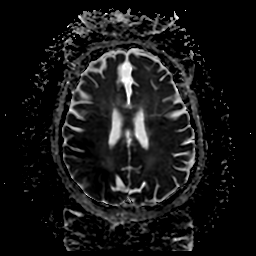
[im 50/50]
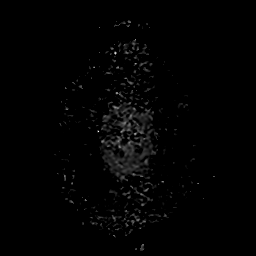

[Series 350: ADC · coronal · 4.0mm · 0.94mm/px · 3 of 37 slices shown (2 of 2)]
[im 1/37]
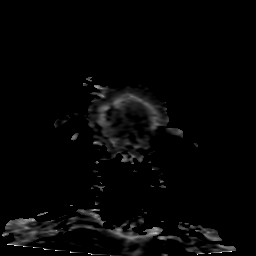
[im 19/37]
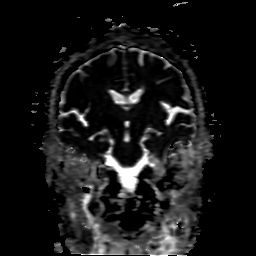
[im 37/37]
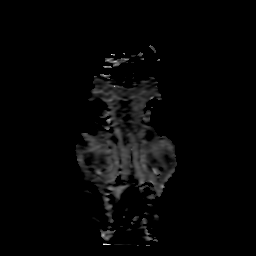

[31 of 48 positions shown; findings below may reference images not displayed]

FINDINGS: Brain:

There is extensive cortically-based diffusion-weighted signal
abnormality and corresponding T2 FLAIR hyperintense signal
abnormality within the right cerebral hemisphere with involvement of
the right ACA, right MCA and right PCA territories. Similar signal
changes are also present within the left parietal and occipital
lobes. Additionally, there is a subcentimeter focus of
diffusion-weighted signal abnormality within the superomedial left
cerebellar hemisphere (series 2, image 20). These findings are
nonspecific but may reflect acute infarcts, sequela of acute
hypoxic/ischemic injury, seizure-related signal changes or a
combination there of.

There is no significant mass effect at this time.

Mild chronic small vessel ischemic changes within the cerebral white
matter.

Redemonstrated chronic small-vessel infarct within the left centrum
semiovale/corona radiata.

Chronic microhemorrhages versus mineralization within the cerebellar
dentate nuclei, bilaterally.

No evidence of an intracranial mass.

No extra-axial fluid collection.

No midline shift.

Vascular: Maintained flow voids within the proximal large arterial
vessels.

Skull and upper cervical spine: No focal suspicious marrow lesion.

Sinuses/Orbits: Visualized orbits show no acute finding. Bilateral
lens replacements. Mild mucosal thickening within the bilateral
ethmoid, sphenoid and maxillary sinuses.

Other: Bilateral mastoid effusions. Fluid layering dependently
within the nasopharynx.
IMPRESSION: Diffusion-weighted and T2 FLAIR hyperintense signal abnormality
within the right cerebral hemisphere, left parietal and occipital
lobes and superomedial left cerebellar hemisphere, as described.
Involvement of the right cerebral hemisphere is extensive. These
signal changes are nonspecific but may reflect acute infarcts, acute
hypoxic/ischemic injury, seizure-related signal changes or a
combination thereof. No significant mass effect at this time.

Mild chronic small vessel ischemic changes within the cerebral white
matter and pons.

Redemonstrated chronic small-vessel infarct within the left centrum
semiovale/corona radiata.

Mild paranasal sinus disease, as outlined.

Bilateral mastoid effusions.

## 2022-08-22 IMAGING — DX DG ABDOMEN 1V
1 series · 1 of 1 positions shown · non-contrast
Comparison: 03/26/2021

CLINICAL DATA: NG tube placement

EXAM:
ABDOMEN - 1 VIEW

[abdomen]
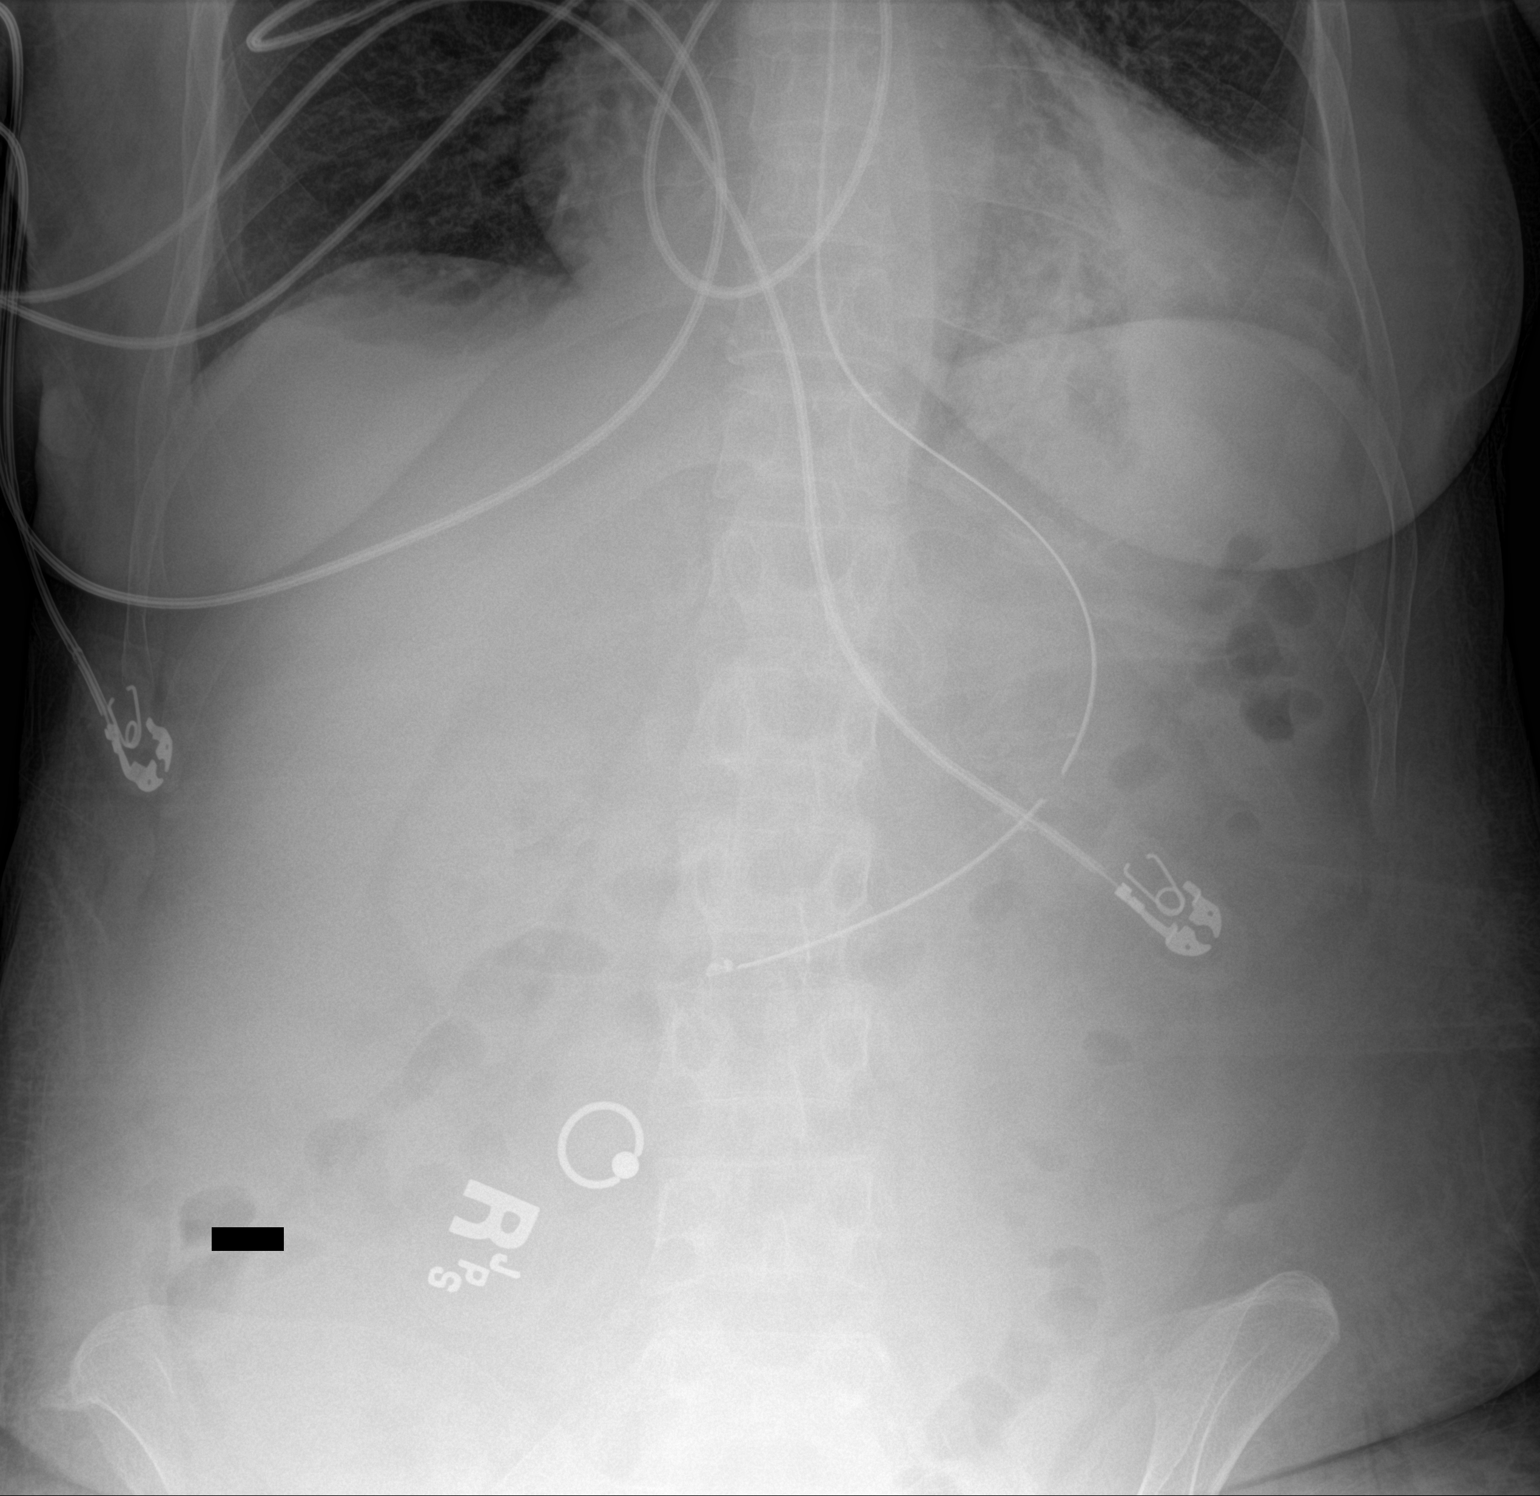

[1 of 1 positions shown; findings below may reference images not displayed]

FINDINGS: Enteric tube terminates in the gastric antrum.

Nonobstructive bowel gas pattern.
IMPRESSION: Enteric tube terminates in the gastric antrum.

## 2022-08-23 IMAGING — DX DG CHEST 1V PORT
1 series · 1 of 1 positions shown · non-contrast
Comparison: Yesterday

CLINICAL DATA: Encounter for central line care

EXAM:
PORTABLE CHEST 1 VIEW

[chest ap]
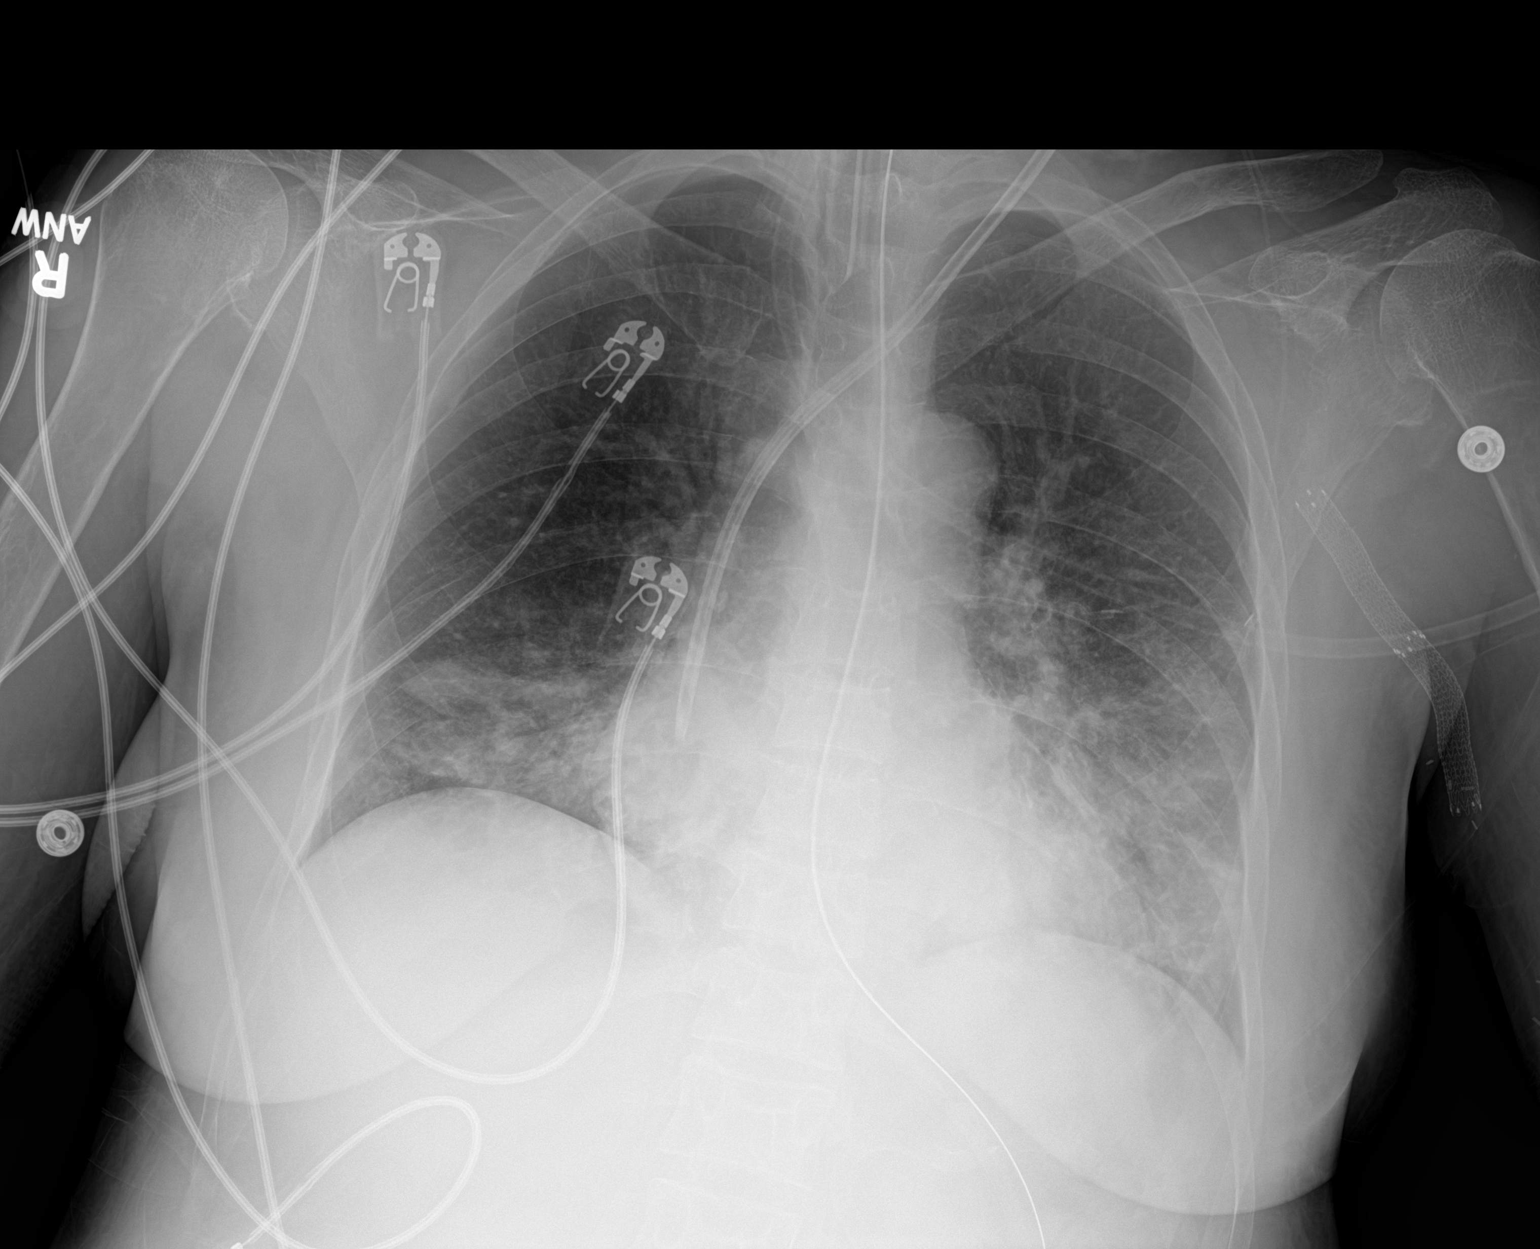

[1 of 1 positions shown; findings below may reference images not displayed]

FINDINGS: Endotracheal tube with tip just above the clavicular heads. The
enteric tube reaches the stomach. Left IJ line with tip at the right
atrium.

Worsening bilateral lower lobe infiltrates, especially on the right.
No visible effusion or pneumothorax. Normal heart size.
IMPRESSION: 1. Unremarkable hardware.
2. Worsening bilateral lower lobe pneumonia.

## 2022-08-24 IMAGING — CT CT HEAD W/O CM
3 of 4 series · 15 of 47 positions shown, 18 images · non-contrast
Comparison: Brain MRI from 2 days ago

CLINICAL DATA: Encephalopathy

EXAM:
CT HEAD WITHOUT CONTRAST
TECHNIQUE: Contiguous axial images were obtained from the base of the skull
through the vertex without intravenous contrast.

[Series 4: head 2.0 h70h · axial · 0.41mm/px · z∈[-475,-351]mm · 9 of 78 slices shown, 12 images]
[im 8/78  brain]
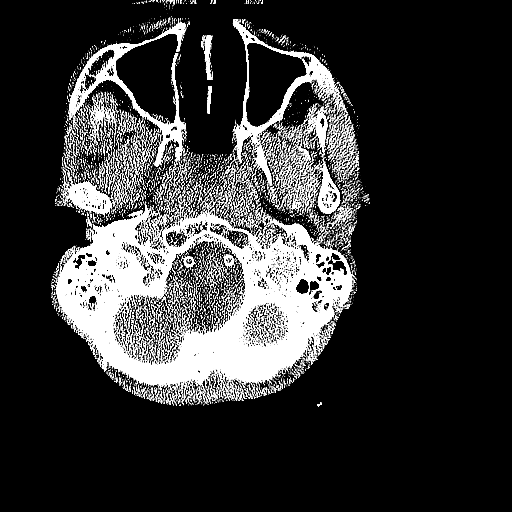
[im 8/78  bone]
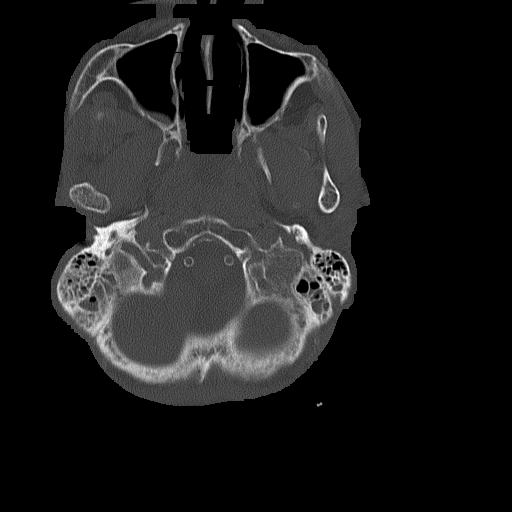
[im 16/78  brain]
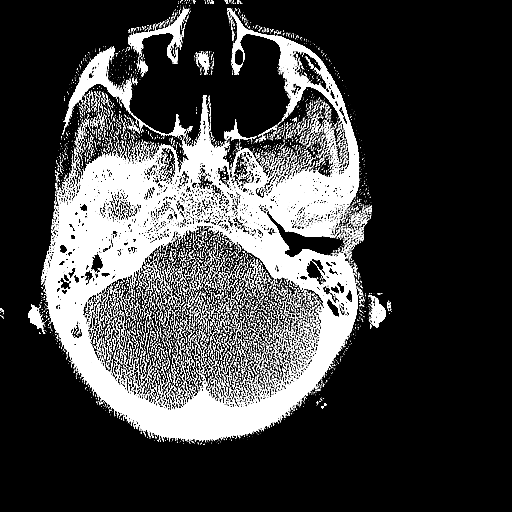
[im 24/78  brain]
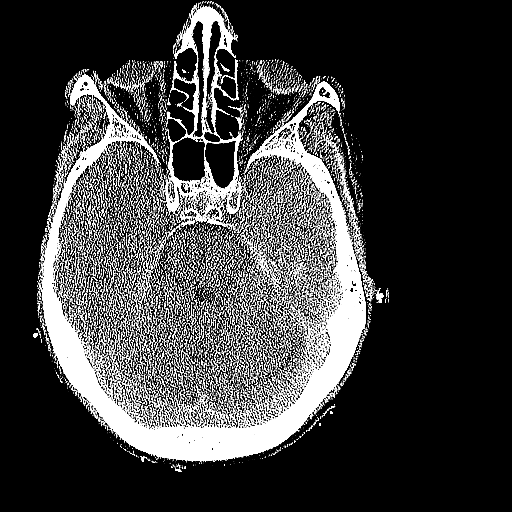
[im 31/78  brain]
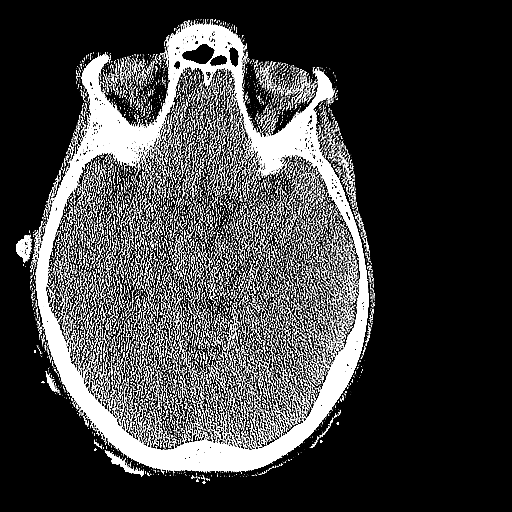
[im 39/78  brain]
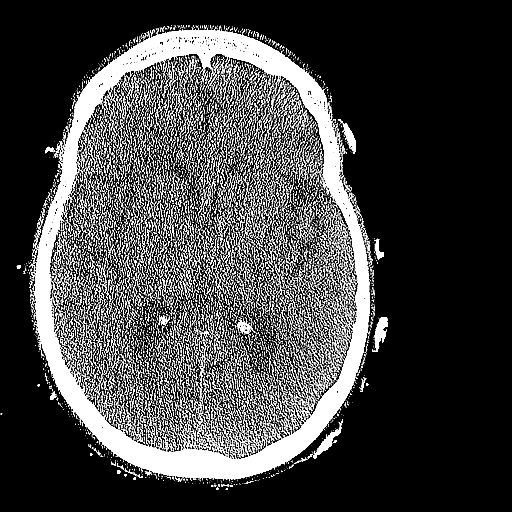
[im 39/78  bone]
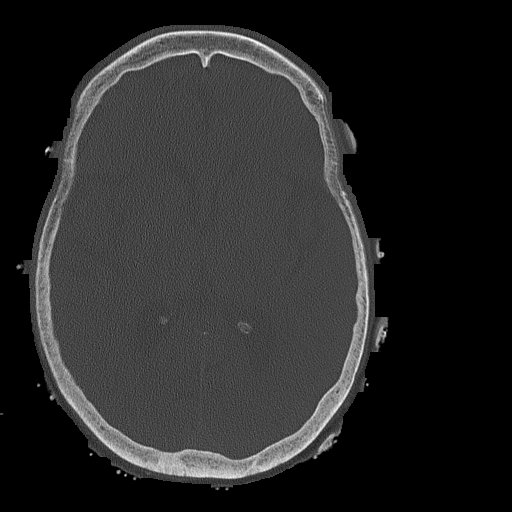
[im 47/78  brain]
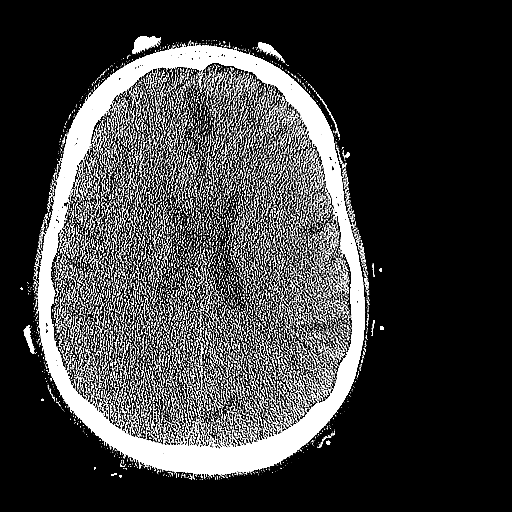
[im 54/78  brain]
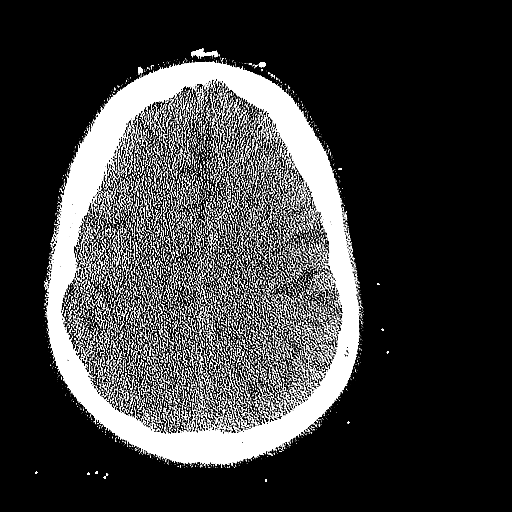
[im 62/78  brain]
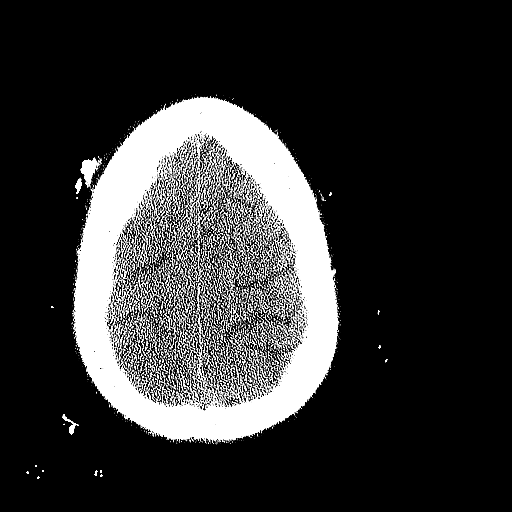
[im 70/78  brain]
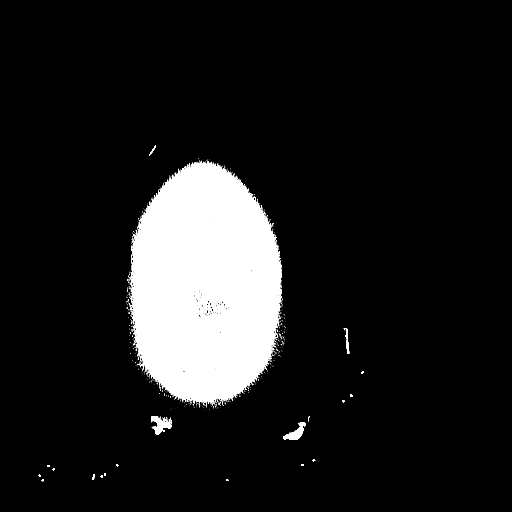
[im 70/78  bone]
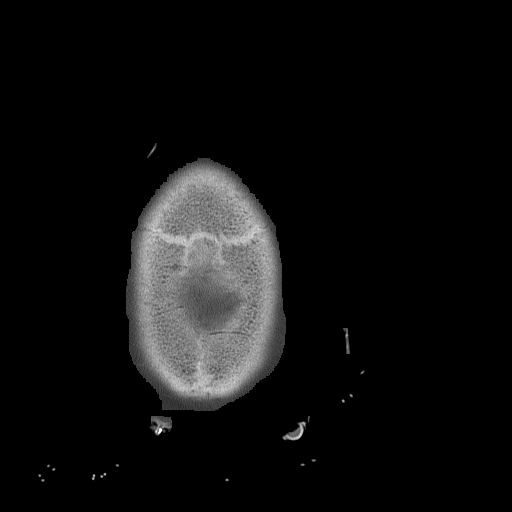

[Series 5: head 3.0 mpr cor · coronal · 0.35mm/px · 3 of 69 slices shown]
[im 23/69  brain]
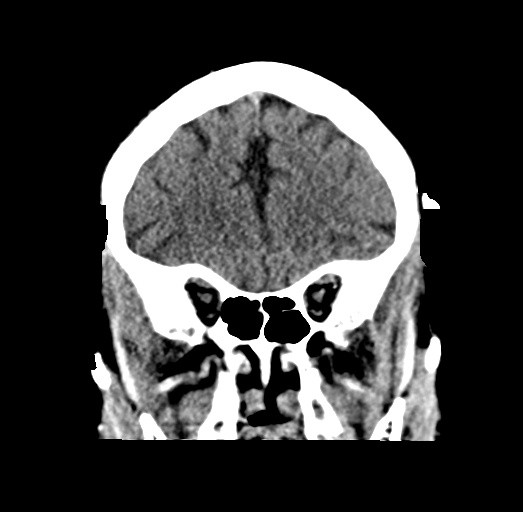
[im 31/69  brain]
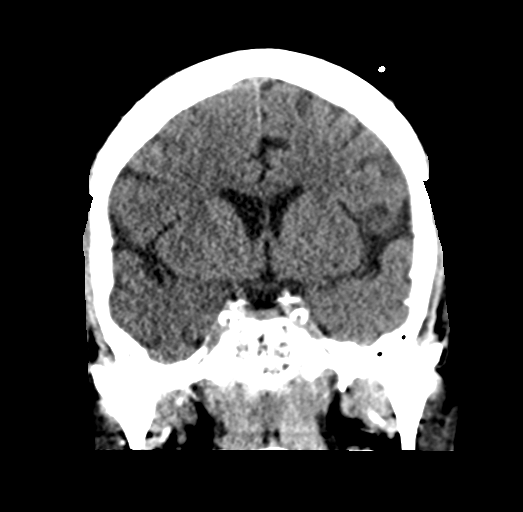
[im 38/69  brain]
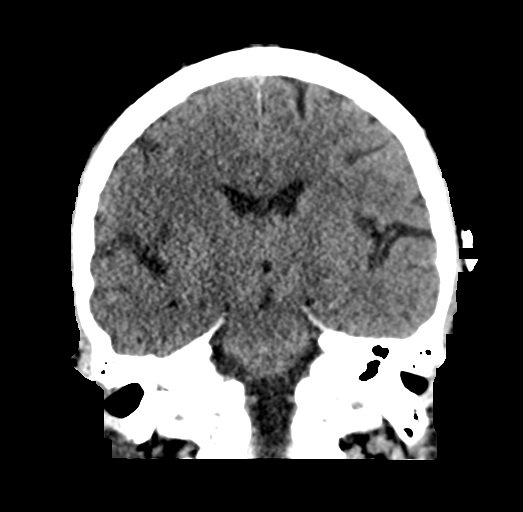

[Series 6: head 3.0 mpr sag · sagittal · 0.35mm/px · 3 of 50 slices shown]
[im 17/50  brain]
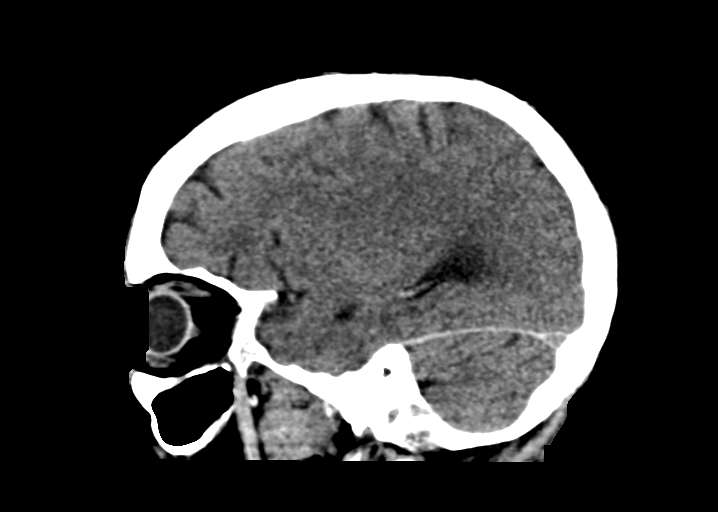
[im 25/50  brain]
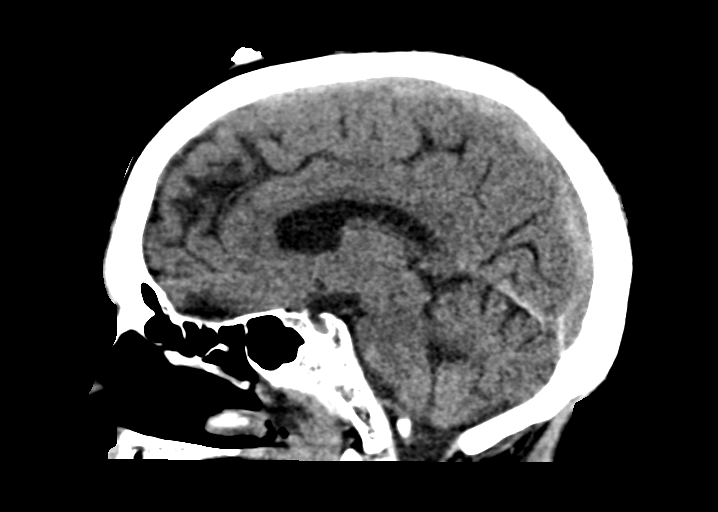
[im 33/50  brain]
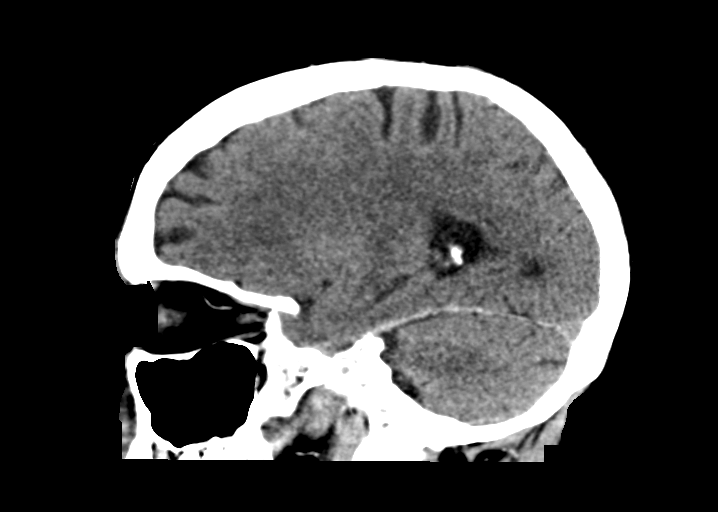

[15 of 47 positions shown; findings below may reference images not displayed]

FINDINGS: Brain: Sulcal effacement and subtle cortical low-density primarily
seen in the right cerebral hemisphere, correlating with prior brain
MRI. No hemorrhage, hydrocephalus, or collection.

Vascular: Negative

Skull: Sclerotic appearance which is likely from end-stage renal
disease.

Sinuses/Orbits: Bilateral mastoid opacification in the setting of
intubation and nasopharyngeal fluid.
IMPRESSION: Cortical edema best seen in the right cerebral hemisphere, reference
preceding brain MRI. No hemorrhage or shift.

## 2022-08-25 IMAGING — MR MR HEAD W/O CM
6 of 10 series · 29 of 48 positions shown · non-contrast
Comparison: Brain MRI from 3 days ago

CLINICAL DATA: Mental status change with unknown cause

EXAM:
MRI HEAD WITHOUT CONTRAST
TECHNIQUE: Multiplanar, multiecho pulse sequences of the brain and surrounding
structures were obtained without intravenous contrast.

[Series 3: DWI · axial · 3.0mm · 0.94mm/px · z∈[-50,+87]mm · 9 of 94 slices shown (1 of 2)]
[im 1/94]
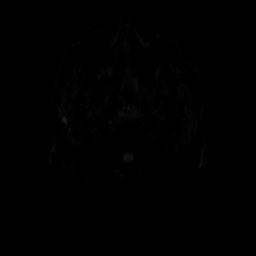
[im 12/94]
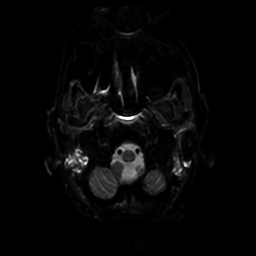
[im 24/94]
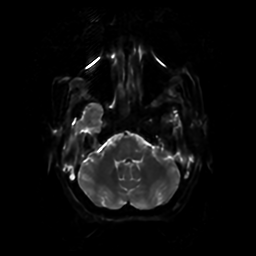
[im 35/94]
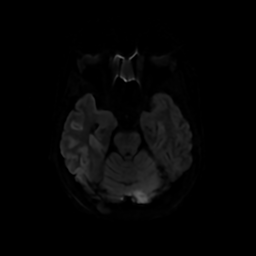
[im 47/94]
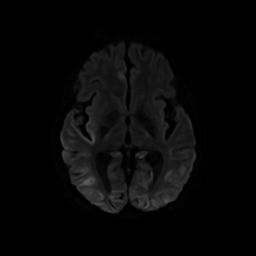
[im 59/94]
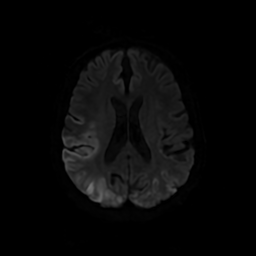
[im 70/94]
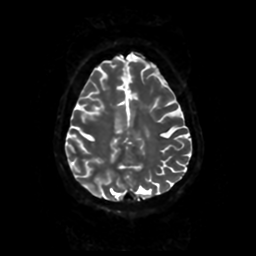
[im 82/94]
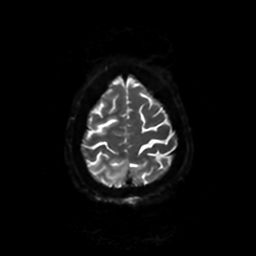
[im 94/94]
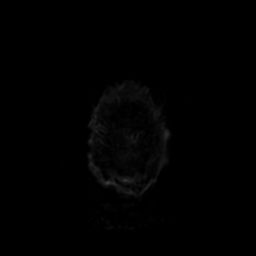

[Series 4: DWI · coronal · 4.0mm · 0.94mm/px · 7 of 68 slices shown (2 of 2)]
[im 1/68]
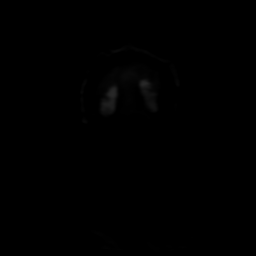
[im 12/68]
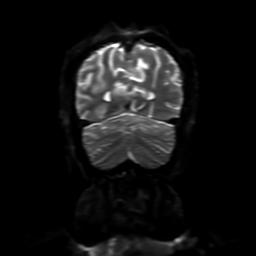
[im 23/68]
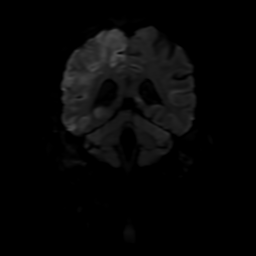
[im 34/68]
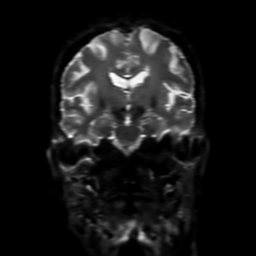
[im 45/68]
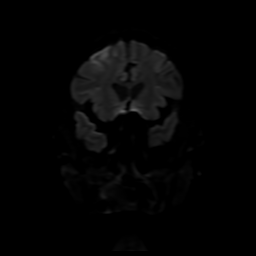
[im 56/68]
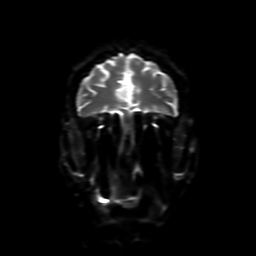
[im 68/68]
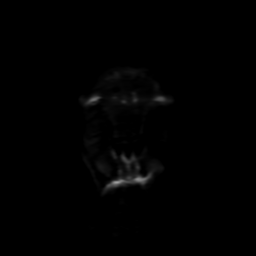

[Series 5: FLAIR · sagittal · 5.0mm · 0.23mm/px · 2 of 23 slices shown (1 of 2)]
[im 1/23]
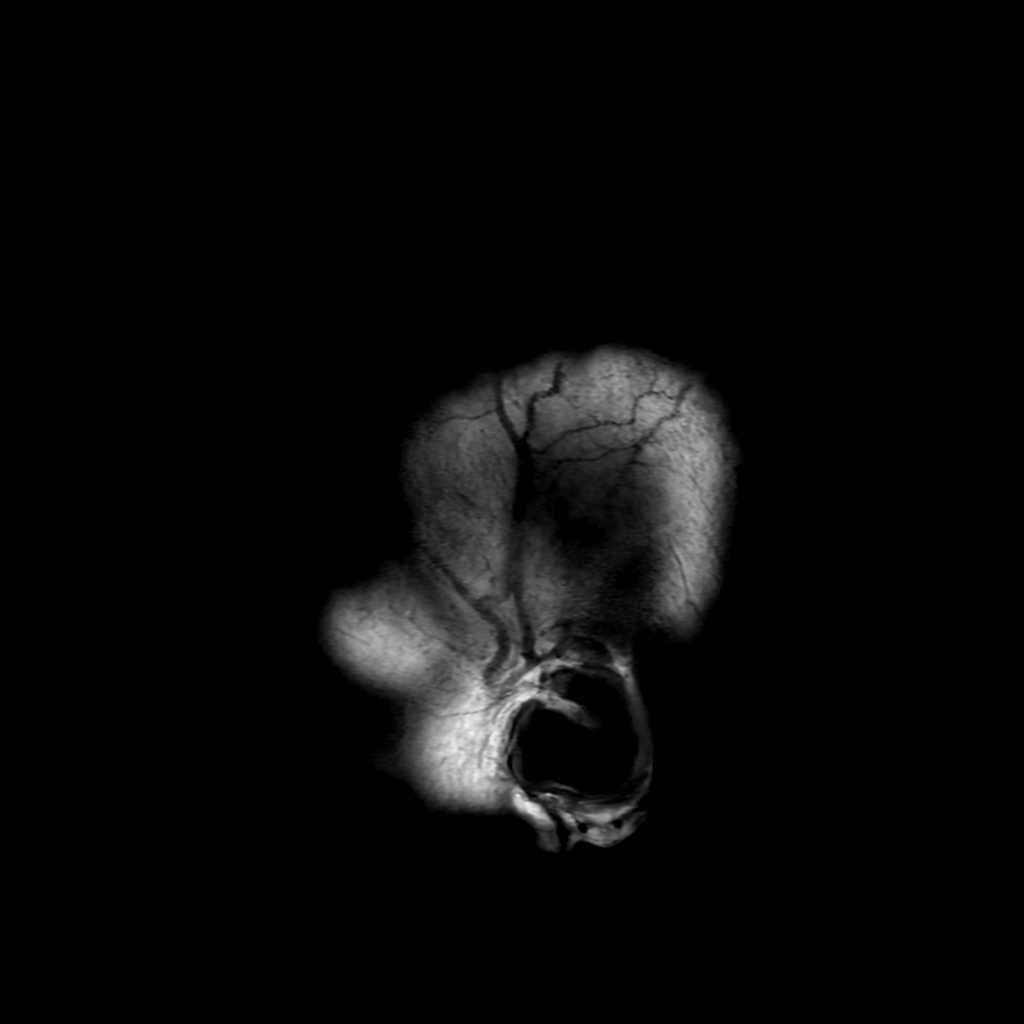
[im 23/23]
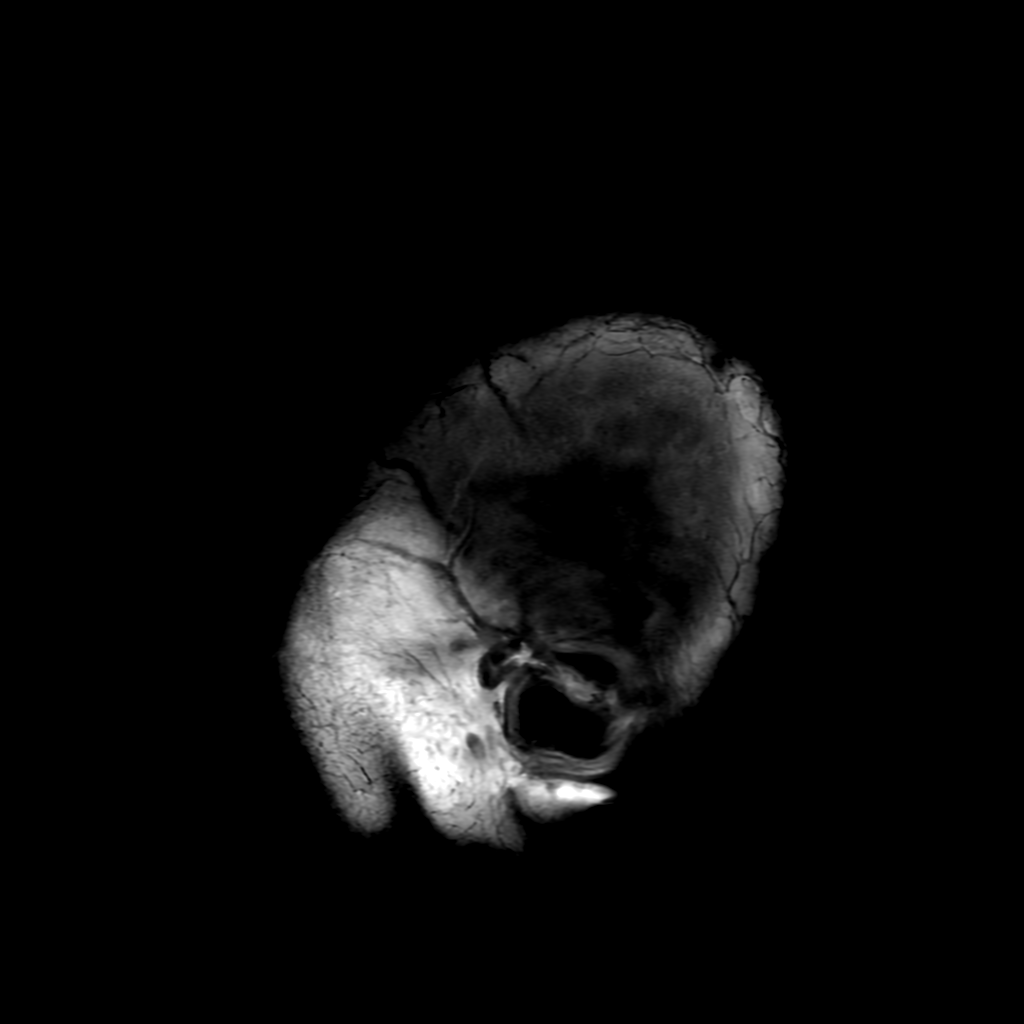

[Series 7: FLAIR · axial · 4.0mm · 0.45mm/px · z∈[-50,+90]mm · 3 of 33 slices shown (2 of 2)]
[im 1/33]
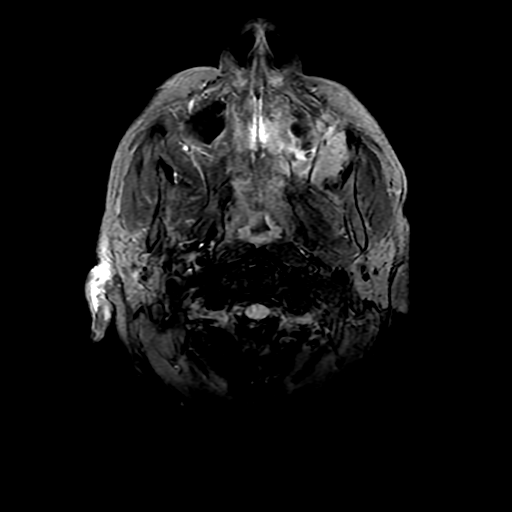
[im 17/33]
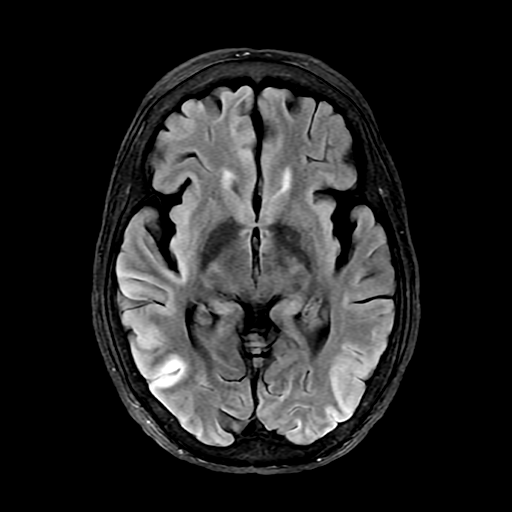
[im 33/33]
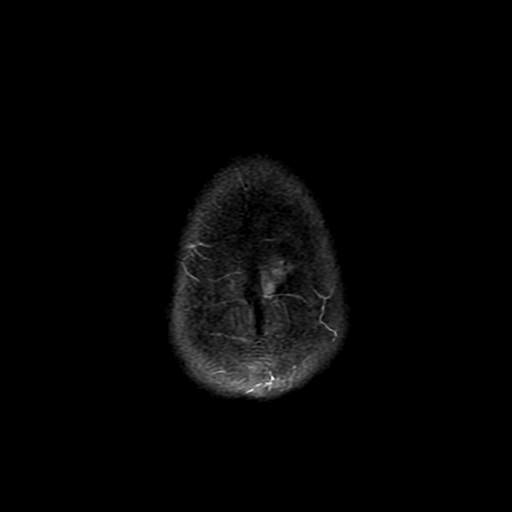

[Series 350: ADC · axial · 3.0mm · 0.94mm/px · z∈[-50,+87]mm · 5 of 47 slices shown (1 of 2)]
[im 1/47]
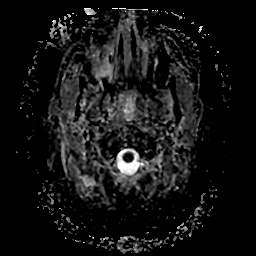
[im 12/47]
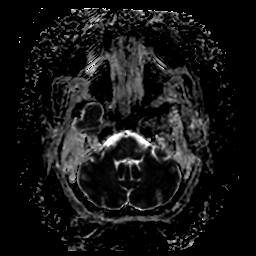
[im 24/47]
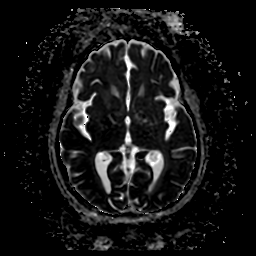
[im 35/47]
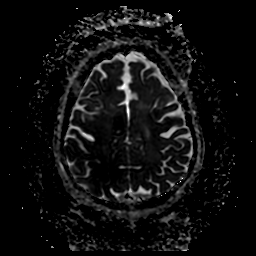
[im 47/47]
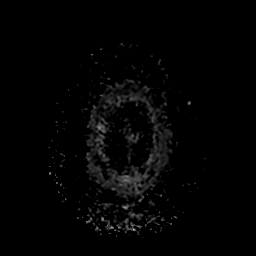

[Series 450: ADC · coronal · 4.0mm · 0.94mm/px · 3 of 34 slices shown (2 of 2)]
[im 1/34]
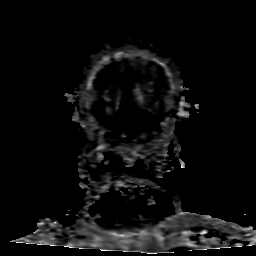
[im 17/34]
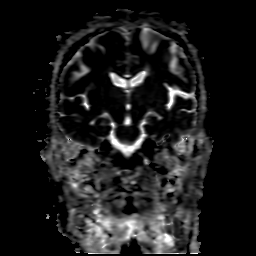
[im 34/34]
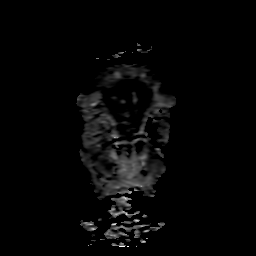

[29 of 48 positions shown; findings below may reference images not displayed]

FINDINGS: Brain: Restricted diffusion involving the cortex of the right more
than left cerebral hemisphere, most heavily affecting the posterior
circulation distributions but certainly seen in the anterior
circulation, especially on the right. This degree of diffusion
restriction is brighter (which is likely from timing or scanner
differences), but not progressed in extent. Both hippocampi are
involved. Notable sparing of the perirolandic level, of undetermined
significance in this setting. In the absence of cerebritis symptoms
features - anoxia, hypoglycemia, or seizure complicated by infarct
are leading considerations-although the asymmetry is atypical for
these entities. No chart indication of posterior reversible
encephalopathy risk factors. There is also limited midline upper
cerebellar involvement which is atypical. No hemorrhage,
hydrocephalus, or notable swelling. Mild chronic white matter
disease.

Vascular: Major flow voids are preserved

Skull and upper cervical spine: Normal marrow signal

Sinuses/Orbits: Partial bilateral mastoid opacification. Adenoid
thickening.

Case discussed with Dr. Rebollar.
IMPRESSION: Unchanged extent of cortical restricted diffusion affecting the
right more than left cerebral hemisphere, as discussed above. No
evidence of new or reversible insult.
